# Patient Record
Sex: Female | Born: 1965 | Race: Black or African American | Hispanic: No | Marital: Single | Smoking: Current some day smoker
Health system: Southern US, Community
[De-identification: ages and names within clinical notes are randomized; demographics above are authoritative.]

## PROBLEM LIST (undated history)

## (undated) ENCOUNTER — Emergency Department (HOSPITAL_COMMUNITY): Discharge status: Absent without leave | Payer: Medicare Other

## (undated) DIAGNOSIS — R569 Unspecified convulsions: Secondary | ICD-10-CM

## (undated) DIAGNOSIS — D649 Anemia, unspecified: Secondary | ICD-10-CM

## (undated) DIAGNOSIS — IMO0001 Reserved for inherently not codable concepts without codable children: Secondary | ICD-10-CM

## (undated) DIAGNOSIS — Z8639 Personal history of other endocrine, nutritional and metabolic disease: Secondary | ICD-10-CM

## (undated) DIAGNOSIS — E7800 Pure hypercholesterolemia, unspecified: Secondary | ICD-10-CM

## (undated) DIAGNOSIS — F319 Bipolar disorder, unspecified: Secondary | ICD-10-CM

## (undated) DIAGNOSIS — Z8719 Personal history of other diseases of the digestive system: Secondary | ICD-10-CM

## (undated) DIAGNOSIS — E119 Type 2 diabetes mellitus without complications: Secondary | ICD-10-CM

## (undated) DIAGNOSIS — F259 Schizoaffective disorder, unspecified: Secondary | ICD-10-CM

## (undated) DIAGNOSIS — I10 Essential (primary) hypertension: Secondary | ICD-10-CM

## (undated) DIAGNOSIS — N186 End stage renal disease: Secondary | ICD-10-CM

## (undated) DIAGNOSIS — I5042 Chronic combined systolic (congestive) and diastolic (congestive) heart failure: Secondary | ICD-10-CM

## (undated) DIAGNOSIS — R51 Headache: Secondary | ICD-10-CM

## (undated) DIAGNOSIS — B1920 Unspecified viral hepatitis C without hepatic coma: Secondary | ICD-10-CM

## (undated) DIAGNOSIS — G459 Transient cerebral ischemic attack, unspecified: Secondary | ICD-10-CM

## (undated) DIAGNOSIS — F32A Depression, unspecified: Secondary | ICD-10-CM

## (undated) DIAGNOSIS — F329 Major depressive disorder, single episode, unspecified: Secondary | ICD-10-CM

## (undated) DIAGNOSIS — A4902 Methicillin resistant Staphylococcus aureus infection, unspecified site: Secondary | ICD-10-CM

## (undated) DIAGNOSIS — K219 Gastro-esophageal reflux disease without esophagitis: Secondary | ICD-10-CM

## (undated) DIAGNOSIS — G894 Chronic pain syndrome: Secondary | ICD-10-CM

## (undated) DIAGNOSIS — G629 Polyneuropathy, unspecified: Secondary | ICD-10-CM

## (undated) DIAGNOSIS — R519 Headache, unspecified: Secondary | ICD-10-CM

## (undated) DIAGNOSIS — F419 Anxiety disorder, unspecified: Secondary | ICD-10-CM

## (undated) DIAGNOSIS — Z531 Procedure and treatment not carried out because of patient's decision for reasons of belief and group pressure: Secondary | ICD-10-CM

## (undated) DIAGNOSIS — Z992 Dependence on renal dialysis: Secondary | ICD-10-CM

## (undated) HISTORY — PX: FOOT AMPUTATION: SHX951

## (undated) HISTORY — PX: TONSILLECTOMY: SUR1361

## (undated) HISTORY — DX: Chronic combined systolic (congestive) and diastolic (congestive) heart failure: I50.42

## (undated) HISTORY — DX: Personal history of other endocrine, nutritional and metabolic disease: Z86.39

---

## 1998-04-11 ENCOUNTER — Emergency Department (HOSPITAL_COMMUNITY): Admission: EM | Admit: 1998-04-11 | Discharge: 1998-04-11 | Payer: Self-pay | Admitting: Emergency Medicine

## 1998-08-05 HISTORY — PX: TUBAL LIGATION: SHX77

## 2001-06-09 ENCOUNTER — Emergency Department (HOSPITAL_COMMUNITY): Admission: EM | Admit: 2001-06-09 | Discharge: 2001-06-09 | Payer: Self-pay | Admitting: Emergency Medicine

## 2001-06-09 ENCOUNTER — Encounter: Payer: Self-pay | Admitting: Emergency Medicine

## 2002-02-18 ENCOUNTER — Emergency Department (HOSPITAL_COMMUNITY): Admission: EM | Admit: 2002-02-18 | Discharge: 2002-02-18 | Payer: Self-pay | Admitting: Emergency Medicine

## 2003-02-10 ENCOUNTER — Emergency Department (HOSPITAL_COMMUNITY): Admission: EM | Admit: 2003-02-10 | Discharge: 2003-02-10 | Payer: Self-pay | Admitting: Emergency Medicine

## 2003-12-05 ENCOUNTER — Emergency Department (HOSPITAL_COMMUNITY): Admission: EM | Admit: 2003-12-05 | Discharge: 2003-12-05 | Payer: Self-pay | Admitting: Emergency Medicine

## 2004-03-29 ENCOUNTER — Emergency Department (HOSPITAL_COMMUNITY): Admission: EM | Admit: 2004-03-29 | Discharge: 2004-03-29 | Payer: Self-pay | Admitting: Emergency Medicine

## 2004-04-02 ENCOUNTER — Emergency Department (HOSPITAL_COMMUNITY): Admission: EM | Admit: 2004-04-02 | Discharge: 2004-04-02 | Payer: Self-pay | Admitting: Emergency Medicine

## 2006-01-29 ENCOUNTER — Ambulatory Visit: Payer: Self-pay | Admitting: Family Medicine

## 2006-01-29 ENCOUNTER — Ambulatory Visit: Payer: Self-pay | Admitting: *Deleted

## 2006-02-10 ENCOUNTER — Ambulatory Visit: Payer: Self-pay | Admitting: Family Medicine

## 2006-07-25 ENCOUNTER — Emergency Department (HOSPITAL_COMMUNITY): Admission: EM | Admit: 2006-07-25 | Discharge: 2006-07-25 | Payer: Self-pay | Admitting: Emergency Medicine

## 2006-08-26 ENCOUNTER — Ambulatory Visit: Payer: Self-pay | Admitting: Family Medicine

## 2007-01-14 ENCOUNTER — Ambulatory Visit: Payer: Self-pay | Admitting: Family Medicine

## 2007-04-22 ENCOUNTER — Encounter (INDEPENDENT_AMBULATORY_CARE_PROVIDER_SITE_OTHER): Payer: Self-pay | Admitting: *Deleted

## 2007-12-22 ENCOUNTER — Emergency Department (HOSPITAL_COMMUNITY): Admission: EM | Admit: 2007-12-22 | Discharge: 2007-12-22 | Payer: Self-pay | Admitting: Emergency Medicine

## 2008-02-12 ENCOUNTER — Inpatient Hospital Stay (HOSPITAL_COMMUNITY): Admission: EM | Admit: 2008-02-12 | Discharge: 2008-02-14 | Payer: Self-pay | Admitting: Emergency Medicine

## 2008-03-21 ENCOUNTER — Ambulatory Visit (HOSPITAL_COMMUNITY): Admission: RE | Admit: 2008-03-21 | Discharge: 2008-03-21 | Payer: Self-pay | Admitting: Ophthalmology

## 2008-03-28 ENCOUNTER — Ambulatory Visit (HOSPITAL_COMMUNITY): Admission: RE | Admit: 2008-03-28 | Discharge: 2008-03-29 | Payer: Self-pay | Admitting: Ophthalmology

## 2008-08-05 DIAGNOSIS — G459 Transient cerebral ischemic attack, unspecified: Secondary | ICD-10-CM

## 2008-08-05 HISTORY — PX: ABDOMINAL HYSTERECTOMY: SHX81

## 2008-08-05 HISTORY — PX: EYE SURGERY: SHX253

## 2008-08-05 HISTORY — DX: Transient cerebral ischemic attack, unspecified: G45.9

## 2008-10-16 ENCOUNTER — Emergency Department (HOSPITAL_COMMUNITY): Admission: EM | Admit: 2008-10-16 | Discharge: 2008-10-16 | Payer: Self-pay | Admitting: Emergency Medicine

## 2008-11-10 ENCOUNTER — Encounter: Admission: RE | Admit: 2008-11-10 | Discharge: 2008-11-10 | Payer: Self-pay | Admitting: Internal Medicine

## 2009-03-04 ENCOUNTER — Inpatient Hospital Stay (HOSPITAL_COMMUNITY): Admission: AD | Admit: 2009-03-04 | Discharge: 2009-03-14 | Payer: Self-pay | Admitting: Psychiatry

## 2009-03-04 ENCOUNTER — Other Ambulatory Visit: Payer: Self-pay

## 2009-03-04 ENCOUNTER — Emergency Department (HOSPITAL_COMMUNITY): Admission: EM | Admit: 2009-03-04 | Discharge: 2009-03-04 | Payer: Self-pay | Admitting: Emergency Medicine

## 2009-03-04 ENCOUNTER — Other Ambulatory Visit: Payer: Self-pay | Admitting: Emergency Medicine

## 2009-03-04 ENCOUNTER — Ambulatory Visit: Payer: Self-pay | Admitting: Psychiatry

## 2009-05-23 IMAGING — CT CT HEAD W/O CM
1 series · 16 of 30 positions shown, 20 images · non-contrast
Comparison: 07/25/2006

CLINICAL DATA: Headache, visual loss, twitching left eye, vomiting

CT HEAD WITHOUT CONTRAST
TECHNIQUE: Contiguous axial images were obtained from the base of
the skull through the vertex without contrast.

[Series 2: head routine 4.8 h37s · axial · 0.43mm/px · z∈[-100,+28]mm · 16 of 30 slices shown, 20 images]
[im 2/30  brain]
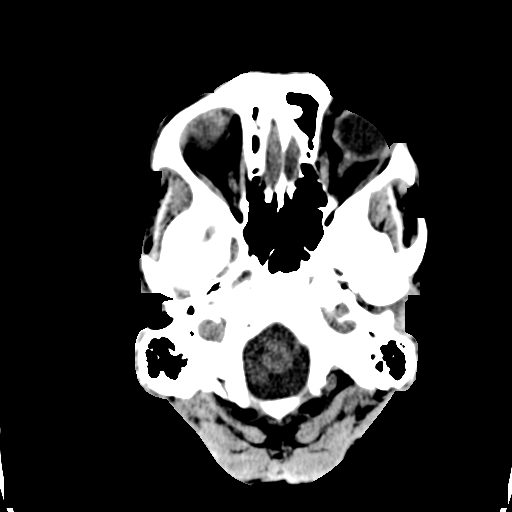
[im 2/30  bone]
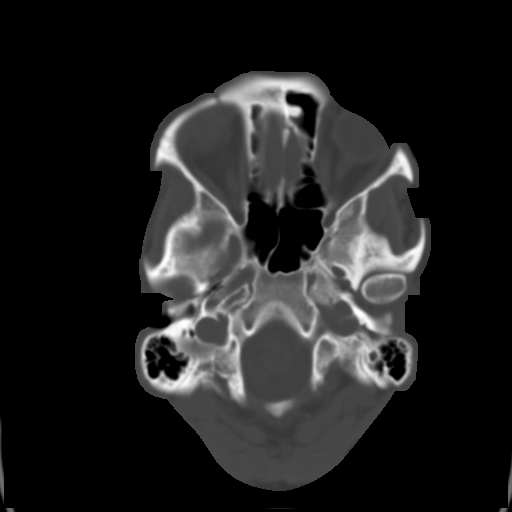
[im 4/30  brain]
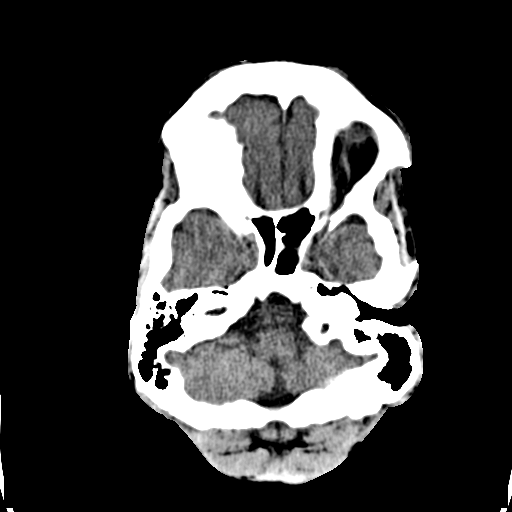
[im 6/30  brain]
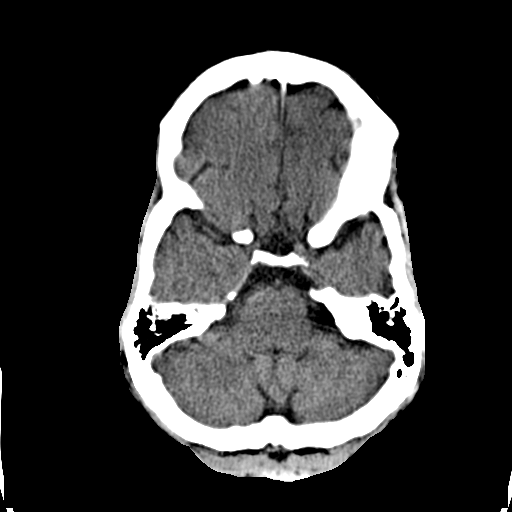
[im 8/30  brain]
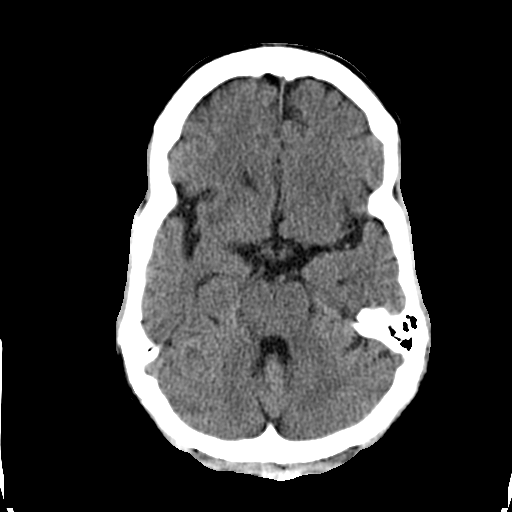
[im 9/30  brain]
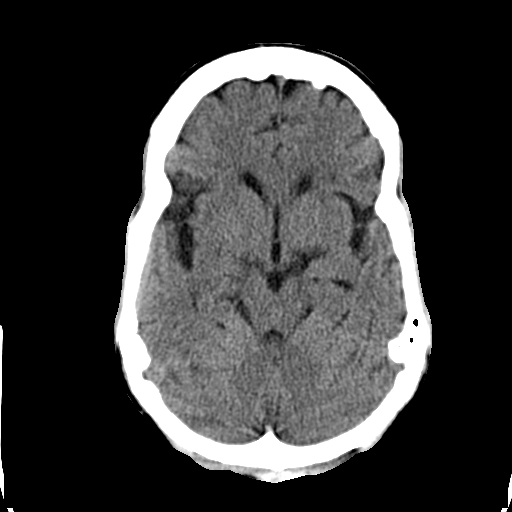
[im 9/30  bone]
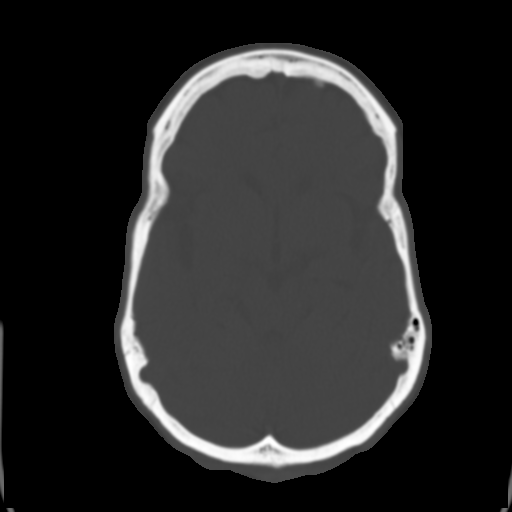
[im 11/30  brain]
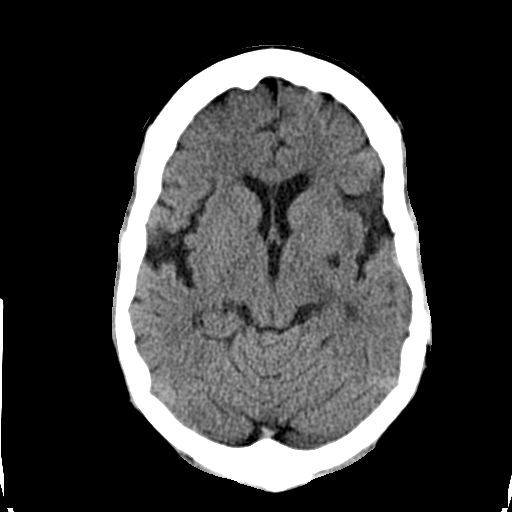
[im 13/30  brain]
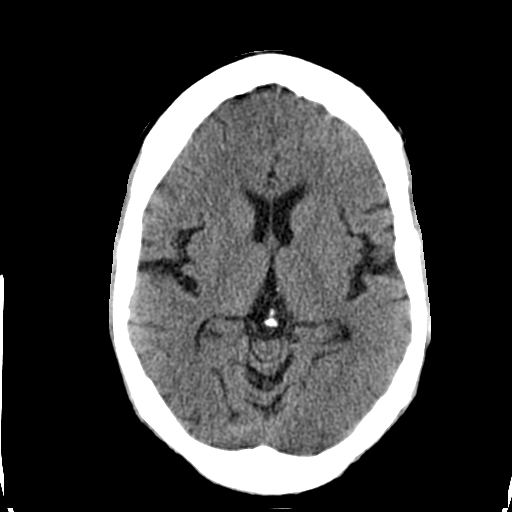
[im 15/30  brain]
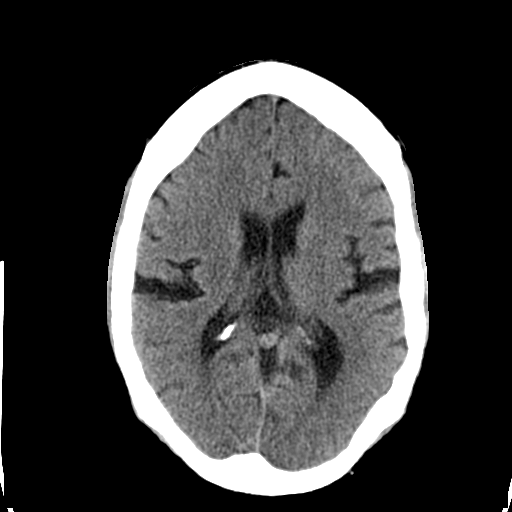
[im 16/30  brain]
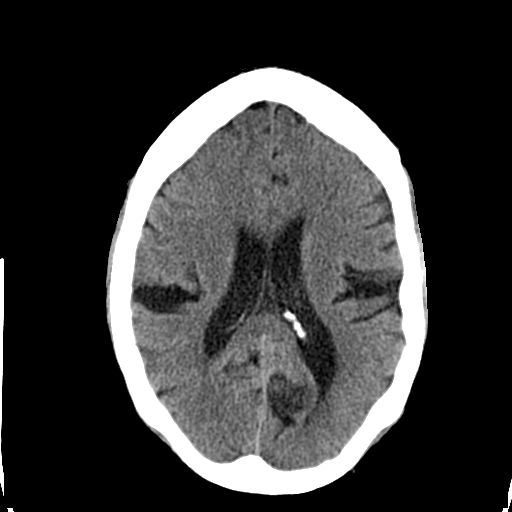
[im 16/30  bone]
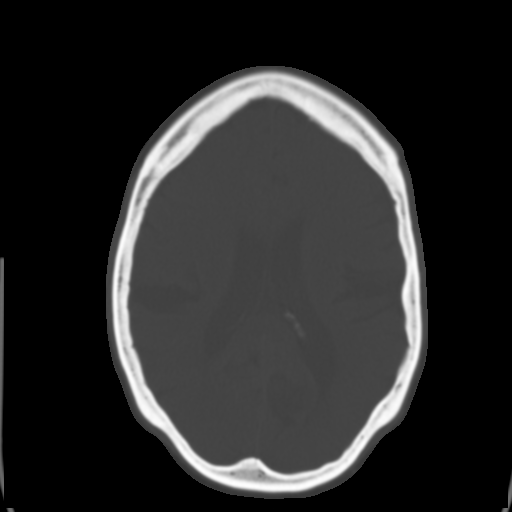
[im 18/30  brain]
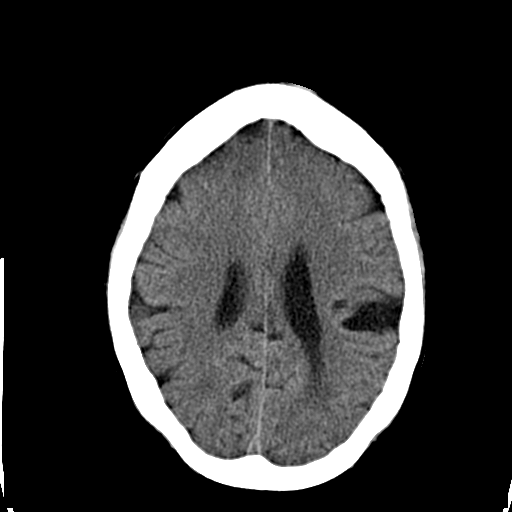
[im 20/30  brain]
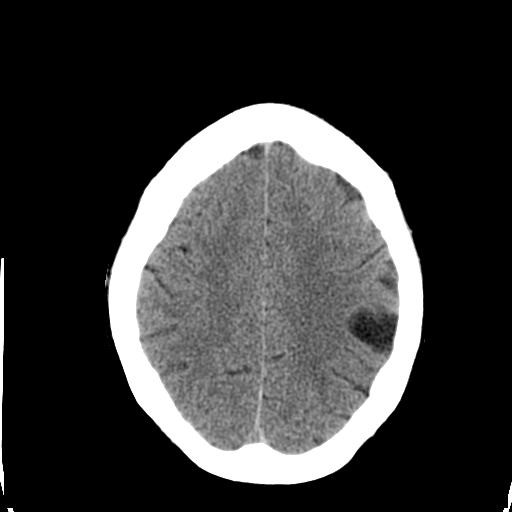
[im 22/30  brain]
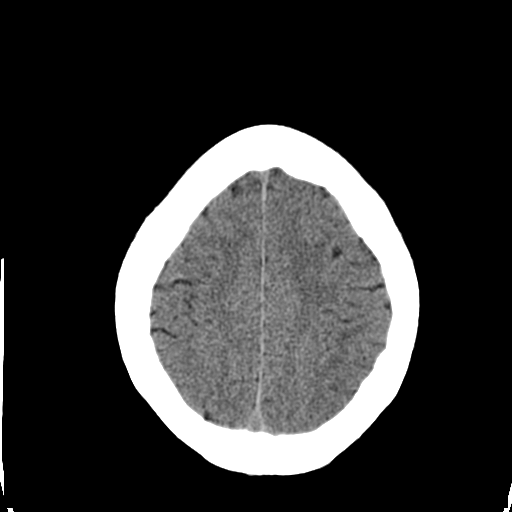
[im 23/30  brain]
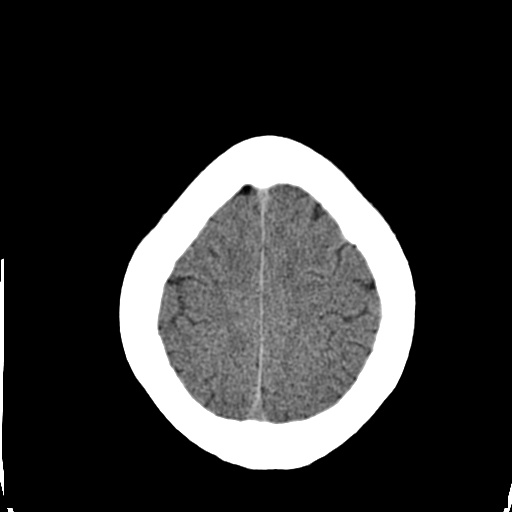
[im 23/30  bone]
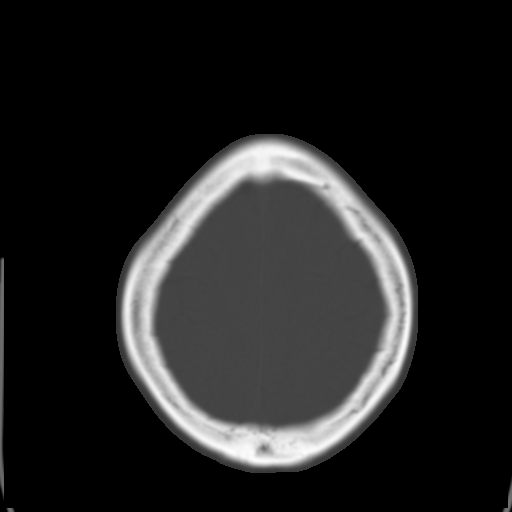
[im 25/30  brain]
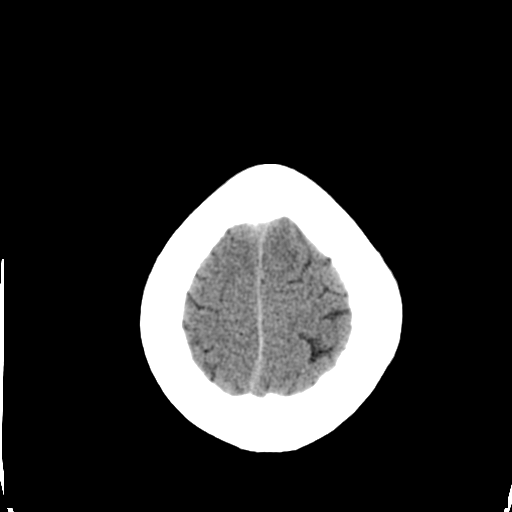
[im 27/30  brain]
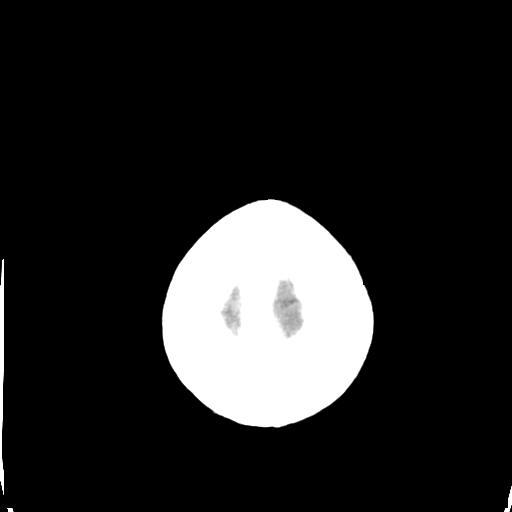
[im 29/30  brain]
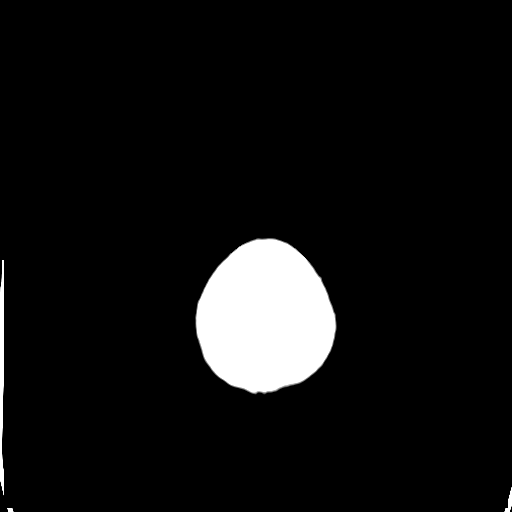

[16 of 30 positions shown; findings below may reference images not displayed]

FINDINGS: Mild generalized atrophy.
Normal ventricular morphology.
No midline shift or mass effect.
Old lacunar infarct left basal ganglia.
No intracranial hemorrhage, mass lesion, or acute infarct.
Visualized paranasal sinuses and mastoid air cells clear.
Skull unremarkable.
IMPRESSION: Old left basal ganglia lacunar infarct.
No acute intracranial abnormalities.

## 2009-05-23 IMAGING — CR DG CHEST 2V
2 series · 2 of 2 positions shown · non-contrast
Comparison: None

CLINICAL DATA: Headache, chest pain, and shortness of breath for 1
month

CHEST - 2 VIEW

[w chest pa]
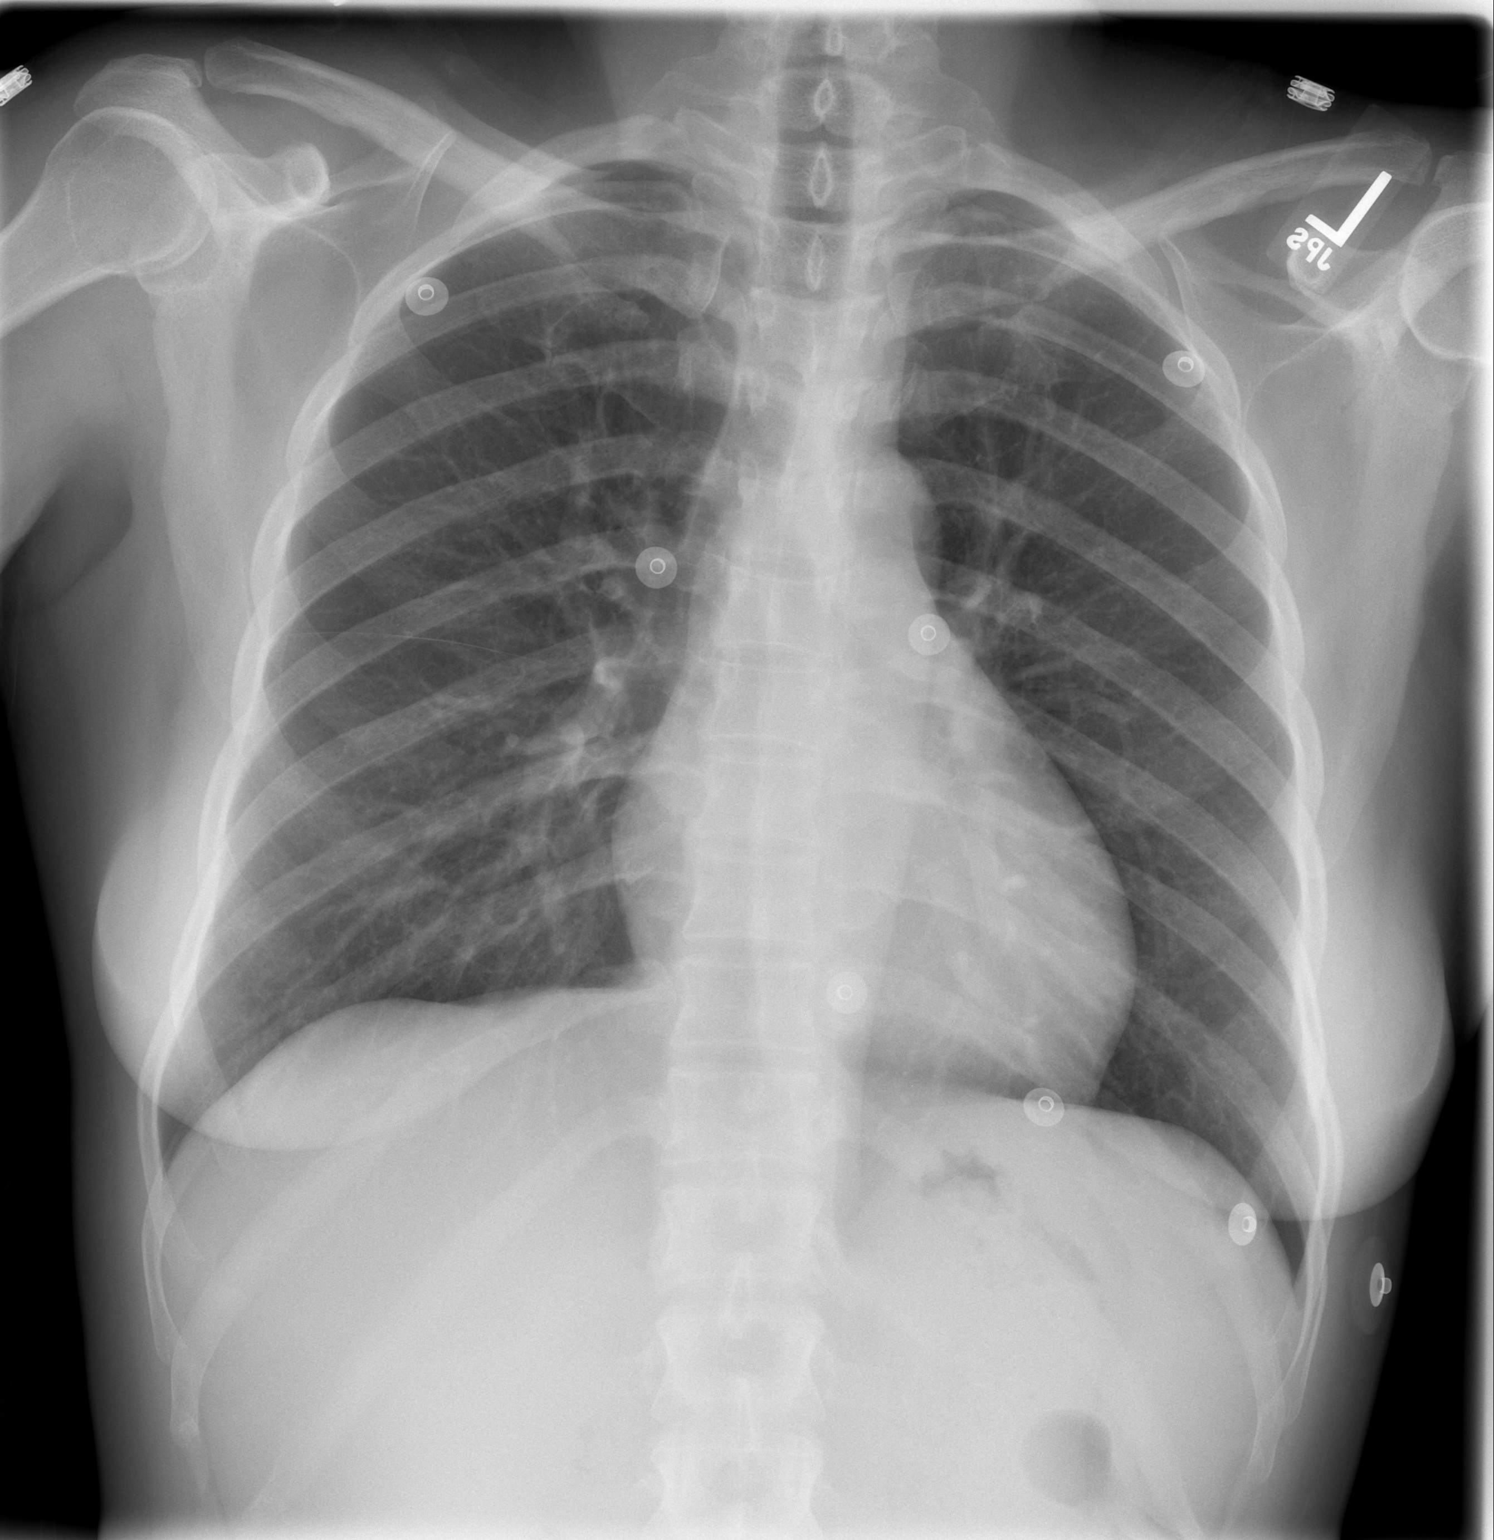

[w chest lat]
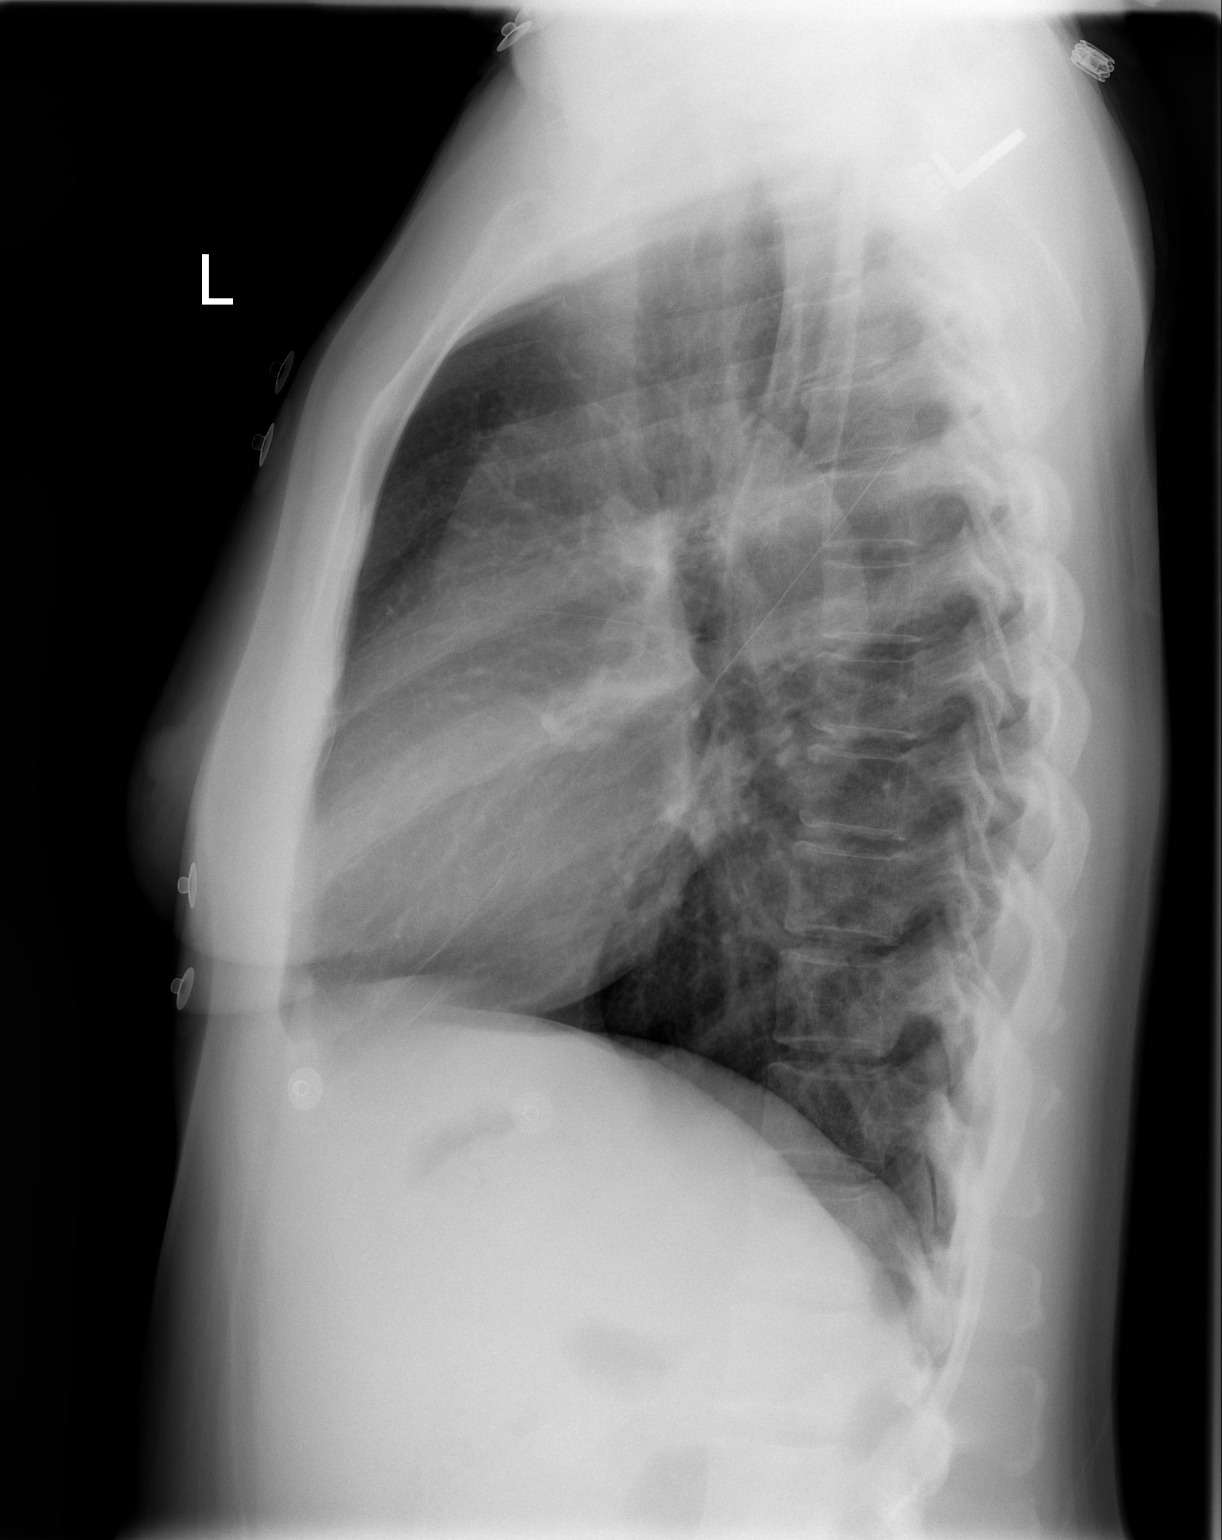

[2 of 2 positions shown; findings below may reference images not displayed]

FINDINGS: The cardiac and mediastinal silhouette appear
unremarkable.  The lung fields are clear.  There is no bony
abnormality.
IMPRESSION: No active disease

## 2010-06-13 IMAGING — CR DG CHEST 1V PORT
1 series · 1 of 1 positions shown · non-contrast
Comparison: 02/12/2008

CLINICAL DATA: Chest pain

PORTABLE CHEST - 1 VIEW

[view not recorded]
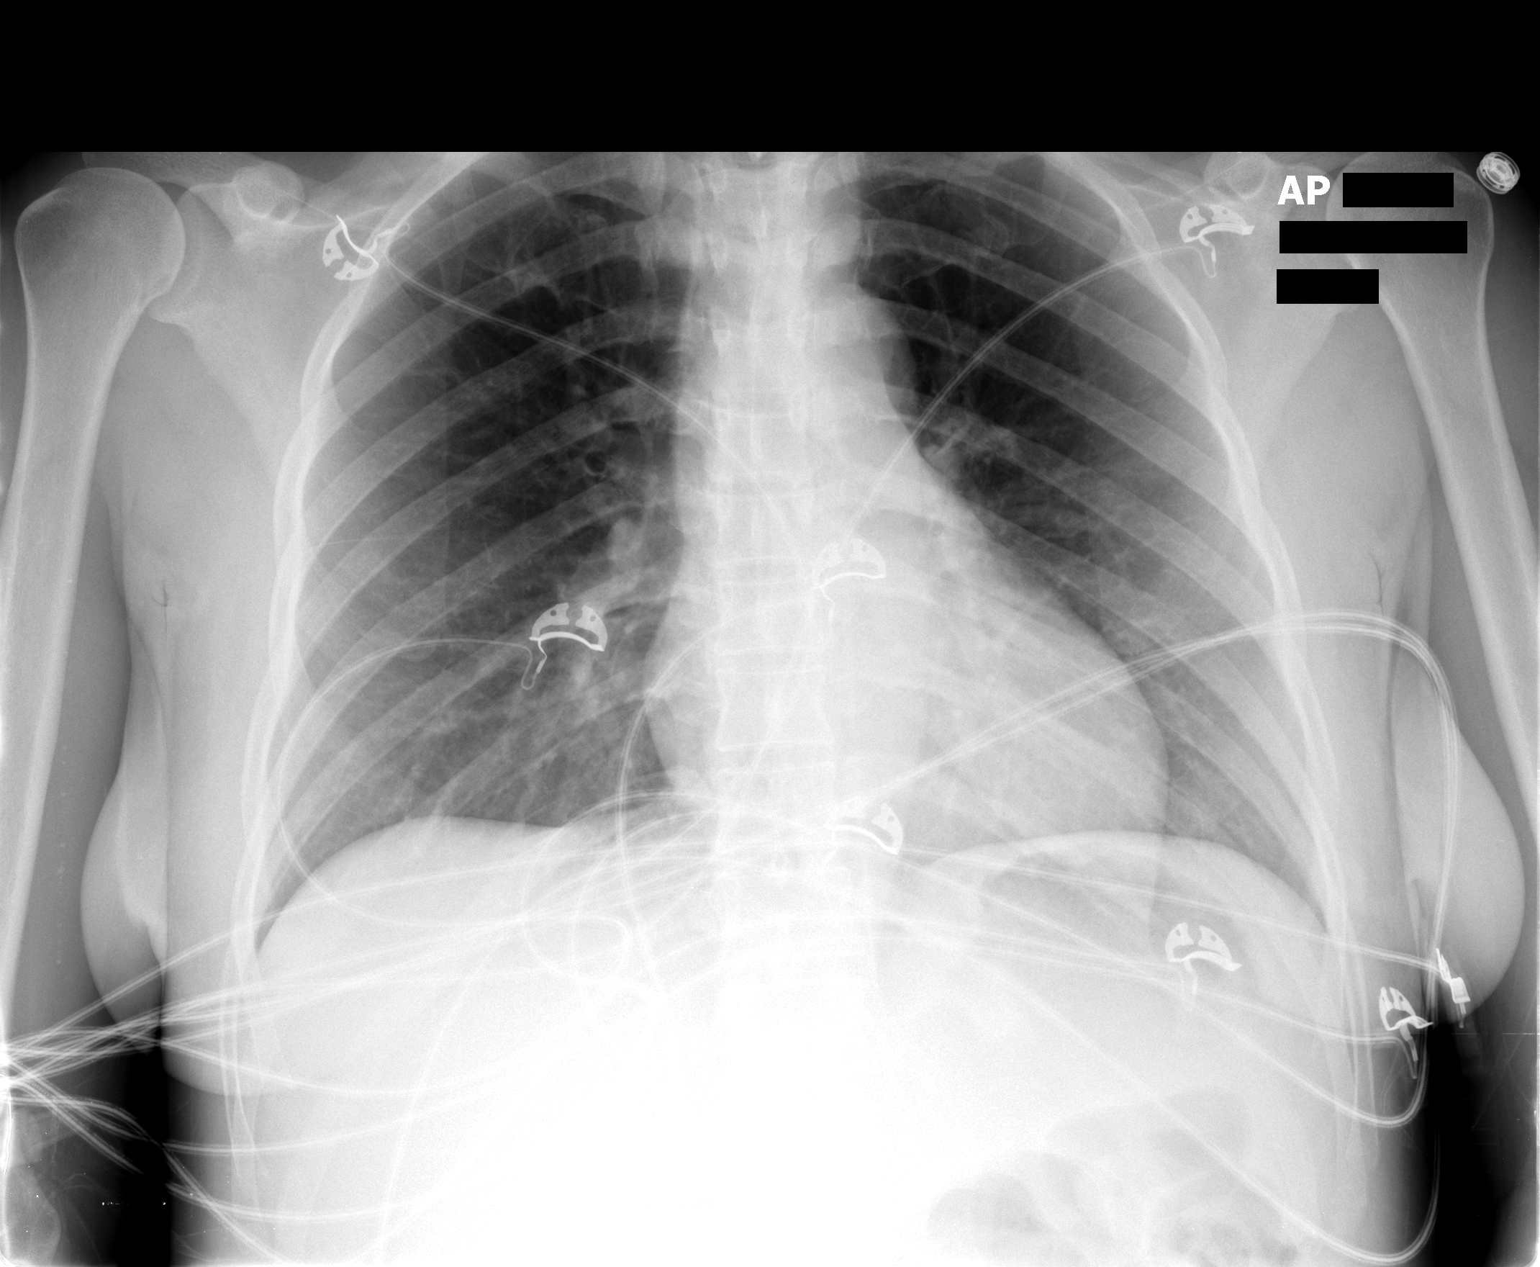

[1 of 1 positions shown; findings below may reference images not displayed]

FINDINGS: Heart and mediastinal contours are within normal limits.
No focal opacities or effusions.  No acute bony abnormality.
IMPRESSION: No active disease.

## 2010-11-10 LAB — GLUCOSE, CAPILLARY
Glucose-Capillary: 101 mg/dL — ABNORMAL HIGH (ref 70–99)
Glucose-Capillary: 121 mg/dL — ABNORMAL HIGH (ref 70–99)
Glucose-Capillary: 146 mg/dL — ABNORMAL HIGH (ref 70–99)
Glucose-Capillary: 15 mg/dL — CL (ref 70–99)
Glucose-Capillary: 155 mg/dL — ABNORMAL HIGH (ref 70–99)
Glucose-Capillary: 185 mg/dL — ABNORMAL HIGH (ref 70–99)
Glucose-Capillary: 187 mg/dL — ABNORMAL HIGH (ref 70–99)
Glucose-Capillary: 196 mg/dL — ABNORMAL HIGH (ref 70–99)
Glucose-Capillary: 213 mg/dL — ABNORMAL HIGH (ref 70–99)
Glucose-Capillary: 225 mg/dL — ABNORMAL HIGH (ref 70–99)
Glucose-Capillary: 229 mg/dL — ABNORMAL HIGH (ref 70–99)
Glucose-Capillary: 230 mg/dL — ABNORMAL HIGH (ref 70–99)
Glucose-Capillary: 244 mg/dL — ABNORMAL HIGH (ref 70–99)
Glucose-Capillary: 254 mg/dL — ABNORMAL HIGH (ref 70–99)
Glucose-Capillary: 256 mg/dL — ABNORMAL HIGH (ref 70–99)
Glucose-Capillary: 269 mg/dL — ABNORMAL HIGH (ref 70–99)
Glucose-Capillary: 276 mg/dL — ABNORMAL HIGH (ref 70–99)
Glucose-Capillary: 287 mg/dL — ABNORMAL HIGH (ref 70–99)
Glucose-Capillary: 291 mg/dL — ABNORMAL HIGH (ref 70–99)
Glucose-Capillary: 309 mg/dL — ABNORMAL HIGH (ref 70–99)
Glucose-Capillary: 313 mg/dL — ABNORMAL HIGH (ref 70–99)
Glucose-Capillary: 329 mg/dL — ABNORMAL HIGH (ref 70–99)
Glucose-Capillary: 375 mg/dL — ABNORMAL HIGH (ref 70–99)
Glucose-Capillary: 376 mg/dL — ABNORMAL HIGH (ref 70–99)
Glucose-Capillary: 426 mg/dL — ABNORMAL HIGH (ref 70–99)
Glucose-Capillary: 75 mg/dL (ref 70–99)

## 2010-11-10 LAB — BASIC METABOLIC PANEL
BUN: 23 mg/dL (ref 6–23)
CO2: 23 mEq/L (ref 19–32)
Calcium: 9.6 mg/dL (ref 8.4–10.5)
GFR calc non Af Amer: 51 mL/min — ABNORMAL LOW (ref >60)
Glucose, Bld: 420 mg/dL — ABNORMAL HIGH (ref 70–99)

## 2010-11-10 LAB — HEMOGLOBIN A1C
Hgb A1c MFr Bld: 8.9 % — ABNORMAL HIGH (ref 4.6–6.1)
Mean Plasma Glucose: 209 mg/dL

## 2010-11-11 LAB — CBC
HCT: 37.7 % (ref 36.0–46.0)
Hemoglobin: 12.9 g/dL (ref 12.0–15.0)
MCHC: 34.4 g/dL (ref 30.0–36.0)
MCV: 94.8 fL (ref 78.0–100.0)
Platelets: 249 10*3/uL (ref 150–400)
RDW: 12.2 % (ref 11.5–15.5)
RDW: 12.3 % (ref 11.5–15.5)
WBC: 5.1 10*3/uL (ref 4.0–10.5)

## 2010-11-11 LAB — GLUCOSE, CAPILLARY: Glucose-Capillary: 453 mg/dL — ABNORMAL HIGH (ref 70–99)

## 2010-11-11 LAB — BASIC METABOLIC PANEL
BUN: 11 mg/dL (ref 6–23)
CO2: 25 mEq/L (ref 19–32)
Calcium: 8.9 mg/dL (ref 8.4–10.5)
Chloride: 102 mEq/L (ref 96–112)
Creatinine, Ser: 1.06 mg/dL (ref 0.4–1.2)
GFR calc Af Amer: >60 mL/min (ref >60)
GFR calc non Af Amer: >60 mL/min (ref >60)
Glucose, Bld: 296 mg/dL — ABNORMAL HIGH (ref 70–99)
Glucose, Bld: 328 mg/dL — ABNORMAL HIGH (ref 70–99)
Potassium: 4 mEq/L (ref 3.5–5.1)
Sodium: 133 mEq/L — ABNORMAL LOW (ref 135–145)

## 2010-11-11 LAB — RAPID URINE DRUG SCREEN, HOSP PERFORMED
Barbiturates: NOT DETECTED
Opiates: NOT DETECTED
Tetrahydrocannabinol: NOT DETECTED

## 2010-11-11 LAB — POCT CARDIAC MARKERS
CKMB, poc: 1 ng/mL (ref 1.0–8.0)
Troponin i, poc: <0.05 ng/mL (ref 0.00–0.09)

## 2010-11-11 LAB — DIFFERENTIAL
Basophils Absolute: 0 10*3/uL (ref 0.0–0.1)
Basophils Relative: 1 % (ref 0–1)
Eosinophils Absolute: 0.1 10*3/uL (ref 0.0–0.7)
Eosinophils Relative: 2 % (ref 0–5)
Lymphocytes Relative: 40 % (ref 12–46)
Lymphs Abs: 2.1 10*3/uL (ref 0.7–4.0)
Monocytes Absolute: 0.3 10*3/uL (ref 0.1–1.0)
Neutro Abs: 2.6 10*3/uL (ref 1.7–7.7)
Neutrophils Relative %: 45 % (ref 43–77)

## 2010-11-15 LAB — CBC
MCHC: 34.1 g/dL (ref 30.0–36.0)
Platelets: 228 10*3/uL (ref 150–400)
RBC: 4.06 MIL/uL (ref 3.87–5.11)
RDW: 14 % (ref 11.5–15.5)

## 2010-11-15 LAB — URINE CULTURE: Colony Count: 60000

## 2010-11-15 LAB — URINALYSIS, ROUTINE W REFLEX MICROSCOPIC
Glucose, UA: 100 mg/dL — AB
Ketones, ur: NEGATIVE mg/dL
Protein, ur: 100 mg/dL — AB

## 2010-11-15 LAB — DIFFERENTIAL
Eosinophils Absolute: 0.1 10*3/uL (ref 0.0–0.7)
Eosinophils Relative: 2 % (ref 0–5)
Lymphs Abs: 1 10*3/uL (ref 0.7–4.0)
Monocytes Absolute: 0.3 10*3/uL (ref 0.1–1.0)
Monocytes Relative: 9 % (ref 3–12)

## 2010-11-15 LAB — COMPREHENSIVE METABOLIC PANEL
ALT: 39 U/L — ABNORMAL HIGH (ref 0–35)
AST: 31 U/L (ref 0–37)
Albumin: 2.9 g/dL — ABNORMAL LOW (ref 3.5–5.2)
CO2: 31 mEq/L (ref 19–32)
Calcium: 8.7 mg/dL (ref 8.4–10.5)
GFR calc Af Amer: 55 mL/min — ABNORMAL LOW (ref >60)
Sodium: 129 mEq/L — ABNORMAL LOW (ref 135–145)
Total Protein: 6.5 g/dL (ref 6.0–8.3)

## 2010-11-15 LAB — URINE MICROSCOPIC-ADD ON

## 2010-12-18 NOTE — H&P (Signed)
NAMESACORA, ARORA NO.:  [id number]   MEDICAL RECORD NO.:  [id number]         PATIENT TYPE:  CINP   LOCATION:                               FACILITY:  [facility]   PHYSICIAN:  Rexene Alberts, M.D.    DATE OF BIRTH:  10-Mar-1966   DATE OF ADMISSION:  02/12/2008  DATE OF DISCHARGE:                              HISTORY & PHYSICAL   PRIMARY CARE PHYSICIAN:  The patient says her primary care physician is  Dr. Jeanie Cooks, although the physician on-call for Dr. Jeanie Cooks refuses to  admit the patient.  The patient's primary psychiatrist is Dr. Mariea Clonts at  [facility].   CHIEF COMPLAINT:  I am going blind in my left eye.  In addition, the  patient complains of chest pain, shortness of breath, dizziness, nausea,  vomiting, and headache.   HISTORY OF PRESENT ILLNESS:  The patient is a 45 year old woman with a  past medical history significant for bipolar disorder, cocaine abuse,  alcohol abuse, and diabetes mellitus.  She presents to the emergency  department with a chief complaint of a significant decrease in vision of  the left eye.  The patient noticed that her vision became cloudy on  Monday, 4 days ago.  Over the next few days, she has had progressive  blurred vision, cloudiness, and at times complete blindness in the left  eye.  She denies any associated eye trauma, eye pain, or eye redness.  She has had an intermittent left posterior headache which has been mild  to moderate in intensity and she describes as throbbing.  She denies any  difficulty eating, swallowing, or speaking.  She denies any recent  confusion.  She has, however, had intermittent dizziness she describes  as being off balance.  She denies vertiginous symptoms.  She denies  associated upper respiratory infection symptoms, fever, and chills.  She  has also had intermittent left-sided chest pain which she has had for  several months now.  The chest pain is sometimes associated with  shortness  of breath.  She has had intermittent swelling in her legs,  particularly when she walks but no leg pain.  She has had some nausea  and intermittent vomiting over the past 2 months.  She denies associated  abdominal pain, diarrhea, bloody stools, painful urination, and bright  red blood per rectum.  She also denies coffee-ground emesis.   The patient admits to relapsing with cocaine.  Over the past 2 days, she  has smoked crack cocaine on several occasions.  She also admits to  drinking two 40-ounce beers daily.   During the evaluation in the emergency department, the patient is noted  to be afebrile and hypertensive with a blood pressure of 180/108.  A CT  scan of the head revealed no acute findings.  However, there is evidence  of an old left lacunar basal ganglia infarct.  Her lab data are  significant for a venous glucose of 432, mild transaminitis with an SGOT  of 63 and SGPT of 41, mild leukopenia with a WBC of 3.5, and an  urine  drug screen positive for cocaine and opiates.  The patient will be  admitted for further evaluation and management.   PAST MEDICAL HISTORY:  1. Type 2 diabetes mellitus with admitted noncompliance over the past      month.  2. Bipolar disorder.  The patient is followed by psychiatrist, Dr.      Mariea Clonts, at the [facility].  3. Polysubstance abuse including alcohol, cocaine, and tobacco.  4. 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  5. Bilateral tubal ligation.   MEDICATIONS):  (The patient admits to being noncompliant at times)  1. Lantus 60 units subcutaneous daily (the patient has not taken      Lantus in 2-3 months).  2. Sliding scale NovoLog q.a.c. and q.h.s.  3. Neurontin 300 mg 3 tablets every night.  4. Combination pill with Actos and metformin question dose twice daily      (the patient is not taking consistently).  5. Lithium 150 mg b.i.d.   ALLERGIES:  THE PATIENT HAS AN ALLERGY TO PENICILLIN, REACTION UNKNOWN.   SOCIAL  HISTORY:  The patient is single.  She lives in [place], [place], with her 3 children.  She receives SSI.  She smokes 3  cigarettes per day.  She denies intravenous drug use.  She smokes crack  cocaine 1 to 2 times daily.  She had been abstinent for 3 years up until  1-2 months ago.  She stopped attending Narcotics Anonymous meetings  recently.  She drinks two 40-ounce beers daily.   FAMILY HISTORY:  Her mother is 37 years of age and has diabetes, end-  stage renal disease, and hypertension.  Her father died of complications  of diabetes mellitus.   REVIEW OF SYSTEMS:  As above in the history present illness.  In  addition, the patient has numbness and burning sensation in her feet.  Otherwise review of systems is negative.   PHYSICAL EXAMINATION:  Temperature 97, blood pressure 180/108, pulse 90,  respiratory rate 20, oxygen saturation 100% on room air.  GENERAL:  The patient is a pleasant, alert, 45 year old, mildly  overweight, African American woman who is currently lying in bed in no  acute distress.  HEENT:  Head is normocephalic nontraumatic.  Pupils equal, round and  reactive to light.  Extraocular muscles intact.  Conjunctivae are clear.  Sclerae are white.  Funduscopic exam on the left reveals a cloudy fundus  with questionable background bleeding.  The fundus on the right is clear  without any acute abnormalities.  Nasal mucosa is dry.  No sinus  tenderness.  Tympanic membranes are mildly obscured with cerumen  bilaterally.  Otherwise no acute changes.  NECK:  Is supple.  No adenopathy, no thyromegaly, no bruit, no JVD.  LUNGS:  Clear to auscultation bilaterally.  HEART:  S1-S2 with no murmurs, rubs or gallops.  ABDOMEN:  Positive bowel sounds, mildly obese, mild epigastric  tenderness without rebound, guarding or distention.  No  hepatosplenomegaly.  GENITOURINARY AND RECTAL:  Deferred.  EXTREMITIES:  Pedal pulses are palpable bilaterally.  No pretibial edema   and no pedal edema.  NEUROLOGIC:  The patient is alert and oriented x3.  Cranial nerves II-  XII are intact with the exception of fine visual acuity on the left  which is significantly diminished.  Grossly, her vision on the left is  intact when the patient is asked to number the fingers that the examiner  is holding up.  Strength is 5/5 throughout.  Sensation  is intact.   ADMISSION LABORATORY:  EKG reveals normal sinus rhythm with no acute  abnormalities and a heart rate of 82 beats per minute.  Chest x-ray  reveals no active cardiopulmonary disease.  CT scan of the head reveals  an old left basal ganglia lacunar infarct but no acute intracranial  abnormalities.  WBC 3.5, hemoglobin 12.8, hematocrit 36.8, platelets  272.  Myoglobin 102, CK-MB 1.5, troponin I less than 0.05.  Sodium 138,  potassium 3.8, chloride 100, CO2 29, glucose 432, BUN 5, creatinine  0.91, calcium 8.7.  SGOT 63, SGPT 41.  Alcohol level less than 5.  Urinalysis:  Urine glucose greater than 1000, urine hemoglobin trace,  urine nitrite negative, urine leukocyte esterase negative.  Urine drug  screen positive for opiates and cocaine.  Lipase 24.   ASSESSMENT:  1. Visual loss in the left eye.  Etiology is unclear.  The patient may      have suffered a retinal hemorrhage.  According to the PA in the      emergency department, ophthalmologist, Dr. Zadie Rhine, was consulted,      and he told the PA that he plans to see the patient tomorrow in the      hospital.  2. Multiple complaints including chest pain, shortness of breath,      disequilibrium, nausea, vomiting, and headache.  3. Uncontrolled diabetes mellitus.  The patient admittedly has been      noncompliant at times with treatment.  Her venous glucose is      currently 432.  4. Polysubstance abuse with alcohol, cocaine, and tobacco.  The      patient's urine drug screen is positive for cocaine and opiates.  5. Elevated blood pressure, probably previously undiagnosed  untreated      hypertension.  6. Mild transaminitis.  More than likely, the patient's transaminitis      is secondary to alcohol abuse.  7. Evidence of old left lacunar basal ganglia infarct per CT scan of      the head.  The patient was informed of this finding.  8. Mild leukopenia.  The patient's WBC is 3.5.  An HIV screen will be      ordered.  9. Bipolar disorder.  The patient is treated chronically with lithium.   PLAN:  1. Ophthalmologist, Dr. Zadie Rhine, has been consulted from the emergency      department.  Will follow up on his consultation tomorrow.  2. Will check a MRI of the brain for further evaluation.  3. Will assess the patient's HIV status.  We will also order an urine      pregnancy test to rule out pregnancy.  4. Will check a 2-D echocardiogram and other baseline labs including      cardiac enzymes, TSH, fasting lipid panel, hemoglobin A1c, and      urinalysis.  We will also check a stat lithium level.  5. Will try to achieve glycemic and blood pressure control.  Will      start Lantus and sliding scale NovoLog.  Consider restarting Actos      and metformin.  6. Will start lisinopril and clonidine.      Rexene Alberts, M.D.  Electronically Signed     DF/MEDQ  D:  02/12/2008  T:  02/12/2008  Job:  [id number]

## 2010-12-18 NOTE — Op Note (Signed)
[name], [name]                  ACCOUNT NO.:  [id number]   MEDICAL RECORD NO.:  [id number]          PATIENT TYPE:  OIB   LOCATION:  5125                         FACILITY:  [facility]   PHYSICIAN:  Clent Demark. Rankin, M.D.   DATE OF BIRTH:  [birth year]   DATE OF PROCEDURE:  03/28/2008  DATE OF DISCHARGE:                               OPERATIVE REPORT   PREOPERATIVE DIAGNOSES:  1. Dense vitreous hemorrhage of the left eye - nonclearing.  2. Proliferative diabetic retinopathy, left eye.   POSTOPERATIVE DIAGNOSES:  1. Dense vitreous hemorrhage of the left eye -  nonclearing.  2. Proliferative diabetic retinopathy, left eye.  3. Epiretinal membrane inferiorly with tractional detachment.   PROCEDURES:  1. Posterior vitrectomy, membrane peel - 25-gauge, left eye inferiorly      for tractional detachment.  2. Endolaser panphotocoagulation, left eye - 25-gauge.   SURGEON:  Clent Demark. Rankin, MD.   ANESTHESIA:  General endotracheal anesthesia as per patient's request.   INDICATION FOR PROCEDURE:  The patient is a 45 year old woman with  profound vision loss of left eye, dense nonclearing vitreous hemorrhage  longstanding in nature.  The patient understands this is an attempt to  remove the dense medial opacity so as to allow best visual function.  She understands the risk of anesthesia including rare occurrence of  death, loss of the eye from the underlying condition as well as surgical  repair including but not limited to hemorrhage, infection, scarring,  need for another surgery, change in vision, loss of vision, and  progressive disease despite intervention.   PROCEDURE IN DETAIL:  Appropriate signed consent was obtained.  The  patient was taken to the operating room.  In the operating room,  appropriate monitors followed by general endotracheal anesthesia.  The  left periocular region was sterilely prepped and draped in the usual  ophthalmic fashion.  Lid speculum was applied.  A 25-gauge  trocar placed  over the inferotemporal quadrant.  Superior trocar was applied.  Core  vitrectomy was then begun.  Dense white old vitreous images were noted.  The posterior hyaloid was identified.  This was elevated and trimmed 360  degrees, vitreous base trimmed 360 degrees.  Two vitreoretinal  detachments and neovascularization noted at the 6 o'clock and second at  the 7 o' clock positions inferiorly anterior to the equator.  These were  probable sites of initial hemorrhage.  There was extensive peripheral  retinal nonperfusion.  Laser photocoagulation was then placed 360  degrees.  Two fluid-air exchanges were carried out to remove the  dispersed ghost-cell type of old hemorrhage.  Fluid  was left in place in the eye.  At this time, the instruments were  removed from the eye and the superior trocar was removed.  The inferior  was removed.  Subconjunctival Decadron applied.  Sterile patch and Fox  shield were applied.  The patient tolerated the procedure well without  complication.  She was taken to the PACU in good stable condition.      Clent Demark Rankin, M.D.  Electronically Signed     GAR/MEDQ  D:  03/28/2008  T:  03/29/2008  Job:  [id number]
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
## 2010-12-18 NOTE — Discharge Summary (Signed)
[name], [name]                  ACCOUNT NO.:  [id number]   MEDICAL RECORD NO.:  [id number]          PATIENT TYPE:  INP   LOCATION:  [id number]                         FACILITY:  [facility]   PHYSICIAN:  Leana Gamer, MDDATE OF BIRTH:  [birth year]   DATE OF ADMISSION:  02/12/2008  DATE OF DISCHARGE:  02/14/2008                               DISCHARGE SUMMARY   DISCHARGE DISPOSITION:  Home.   FINAL DISCHARGE DIAGNOSES:  1. Partial vision loss secondary to diabetic retinopathy.  2. Diabetes type 2, uncontrolled.  3. Chest pain, noncardiac.  4. History of diabetic neuropathy.  5. History of bipolar disorder.  6. History of polysubstance abuse.  7. Hypertension.   DISCHARGE MEDICATIONS:  1. Lantus 60 units subcu nightly.  2. NovoLog sliding scale.  3. Neurontin 300 mg p.o. t.i.d.  4. Lithium 100 mg p.o. b.i.d.  5. Clonidine 0.4 mg p.o. b.i.d.  6. Thiamine 100 mg p.o. daily.  7. Folate 1 mg p.o. daily.   CONSULTANTS:  Dr. Annamaria Boots, Ophthalmology.   PROCEDURE:  None.   DIAGNOSTIC STUDIES:  1. MR of the brain with and without contrast, which showed no acute      abnormality, generalized atrophy for age.  2. 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  No acute intracranial abnormalities.  3. Chest x-ray done on admission, which shows no active disease.   PERTINENT LABORATORY STUDIES:  1. Lithium level less than 0.25  2. Total CK 271, CK-MB 2.5, troponin 0.01x3.  3. Cholesterol 136 total, triglycerides 54, LDL 49, VLDL 11.  4. TSH 1.311.  5. Hemoglobin A1c 8.8.  Sodium 136, potassium 4.2, chloride 106,      bicarb 25, BUN 9, creatinine 0.74.  White blood cell count 3.5,      hemoglobin 12.0, hematocrit 34.9, platelet 237.   CHIEF COMPLAINT:  Vision loss in the left eye over one week and chest  pain.   HISTORY OF PRESENT ILLNESS:  Please see the H&P dictated by Dr. Caryn Section  for details of the HPI.   HOSPITAL COURSE:  1. Chest  pain.  The patient had her enzymes cycled for resting      ischemia.  The patient had no escalation of her enzymes and was      essentially ruled out for resting ischemia.  The patient ambulated      up and down the halls without any further chest pain.  2. Loss of vision in the left eye.  It was discussed with the patient      that diabetes has been uncontrolled.  The patient had not received      a vision examination for 6 years.  I asked Ophthalmology to see the      patient, and he did a slit lamp examination here in the hospital,      which revealed the patient did have diabetic retinopathy with some      mild hemorrhaging. Recommendations are for the patient to follow up      as an outpatient  within one month with Dr. Ara Kussmaul for      further evaluation and treatment of the diabetic retinopathy.  The      patient has also been counseled against further use of cocaine,      alcohol and any drugs, as this can increase the pressures and cause      problems with complications to the diabetic retinopathy.  3. Bipolar disorder.  The patient has been noncompliant to her Lithium      use.  I have recommended the patient go back to her mental health      clinic and be redosed on her Lithium or an alternate medication.  4. Diabetes type 2.  The patient also has been noncompliant with here      treatment for diabetes.  She has been given education on diabetic      management.  The patient is being resumed on her Lantus and NovoLog      as noted above.   CONDITION ON DISCHARGE:  Stable.   The patient is ambulating without any difficulty.  Her vital signs are  stable.  She is afebrile.  LUNGS:  Clear to auscultation.  ABDOMEN:  Obese, soft, nontender, nondistended.  She is normal sinus rhythm on the monitor without any further chest  complaints.   FOLLOWUP:  The patient is to follow up with Dr. Ricki Miller within a  month regarding her diabetic retinopathy.  She is to follow up  with  [facility], where she is a patient, regarding her diabetes.  She has  been counseled against further substance abuse.   DIETARY RESTRICTIONS:  She should be on a diabetic diet.   PHYSICAL RESTRICTIONS:  None.      Leana Gamer, MD  Electronically Signed     MAM/MEDQ  D:  02/14/2008  T:  02/14/2008  Job:  [id number]

## 2011-04-17 ENCOUNTER — Emergency Department (HOSPITAL_COMMUNITY): Payer: Medicare Other

## 2011-04-17 ENCOUNTER — Emergency Department (HOSPITAL_COMMUNITY)
Admission: EM | Admit: 2011-04-17 | Discharge: 2011-04-17 | Disposition: A | Discharge status: Home / Self Care | Payer: Medicare Other | Attending: Emergency Medicine | Admitting: Emergency Medicine

## 2011-04-17 DIAGNOSIS — I10 Essential (primary) hypertension: Secondary | ICD-10-CM | POA: Insufficient documentation

## 2011-04-17 DIAGNOSIS — R0609 Other forms of dyspnea: Secondary | ICD-10-CM | POA: Insufficient documentation

## 2011-04-17 DIAGNOSIS — E119 Type 2 diabetes mellitus without complications: Secondary | ICD-10-CM | POA: Insufficient documentation

## 2011-04-17 DIAGNOSIS — E871 Hypo-osmolality and hyponatremia: Secondary | ICD-10-CM | POA: Insufficient documentation

## 2011-04-17 DIAGNOSIS — Z8673 Personal history of transient ischemic attack (TIA), and cerebral infarction without residual deficits: Secondary | ICD-10-CM | POA: Insufficient documentation

## 2011-04-17 DIAGNOSIS — F319 Bipolar disorder, unspecified: Secondary | ICD-10-CM | POA: Insufficient documentation

## 2011-04-17 DIAGNOSIS — Y92009 Unspecified place in unspecified non-institutional (private) residence as the place of occurrence of the external cause: Secondary | ICD-10-CM | POA: Insufficient documentation

## 2011-04-17 DIAGNOSIS — R059 Cough, unspecified: Secondary | ICD-10-CM | POA: Insufficient documentation

## 2011-04-17 DIAGNOSIS — R0989 Other specified symptoms and signs involving the circulatory and respiratory systems: Secondary | ICD-10-CM | POA: Insufficient documentation

## 2011-04-17 DIAGNOSIS — E2749 Other adrenocortical insufficiency: Secondary | ICD-10-CM | POA: Insufficient documentation

## 2011-04-17 DIAGNOSIS — R05 Cough: Secondary | ICD-10-CM | POA: Insufficient documentation

## 2011-04-17 DIAGNOSIS — X001XXA Exposure to smoke in uncontrolled fire in building or structure, initial encounter: Secondary | ICD-10-CM | POA: Insufficient documentation

## 2011-04-17 DIAGNOSIS — Z79899 Other long term (current) drug therapy: Secondary | ICD-10-CM | POA: Insufficient documentation

## 2011-04-17 DIAGNOSIS — Z794 Long term (current) use of insulin: Secondary | ICD-10-CM | POA: Insufficient documentation

## 2011-04-17 DIAGNOSIS — J705 Respiratory conditions due to smoke inhalation: Secondary | ICD-10-CM | POA: Insufficient documentation

## 2011-04-17 LAB — BASIC METABOLIC PANEL
BUN: 30 mg/dL — ABNORMAL HIGH (ref 6–23)
CO2: 26 mEq/L (ref 19–32)
Calcium: 9.6 mg/dL (ref 8.4–10.5)
Chloride: 94 mEq/L — ABNORMAL LOW (ref 96–112)
Creatinine, Ser: 1.47 mg/dL — ABNORMAL HIGH (ref 0.50–1.10)
GFR calc Af Amer: 47 mL/min — ABNORMAL LOW (ref >60)
GFR calc non Af Amer: 39 mL/min — ABNORMAL LOW (ref >60)
Glucose, Bld: 708 mg/dL (ref 70–99)
Potassium: 5.4 mEq/L — ABNORMAL HIGH (ref 3.5–5.1)
Sodium: 129 mEq/L — ABNORMAL LOW (ref 135–145)

## 2011-04-17 LAB — CBC
HCT: 35.2 % — ABNORMAL LOW (ref 36.0–46.0)
Hemoglobin: 12.3 g/dL (ref 12.0–15.0)
MCH: 31.5 pg (ref 26.0–34.0)
MCHC: 34.9 g/dL (ref 30.0–36.0)
MCV: 90.3 fL (ref 78.0–100.0)
Platelets: 183 10*3/uL (ref 150–400)
RBC: 3.9 MIL/uL (ref 3.87–5.11)
RDW: 12.6 % (ref 11.5–15.5)
WBC: 5.1 10*3/uL (ref 4.0–10.5)

## 2011-04-17 LAB — POCT PREGNANCY, URINE: Preg Test, Ur: NEGATIVE

## 2011-04-17 LAB — CARBOXYHEMOGLOBIN
Carboxyhemoglobin: 0.6 % (ref 0.5–1.5)
Methemoglobin: 0.5 % (ref 0.0–1.5)
O2 Saturation: 30.2 %
Total hemoglobin: 12.2 g/dL — ABNORMAL LOW (ref 12.5–16.0)

## 2011-04-17 LAB — DIFFERENTIAL
Basophils Absolute: 0 10*3/uL (ref 0.0–0.1)
Basophils Relative: 0 % (ref 0–1)
Eosinophils Absolute: 0.1 10*3/uL (ref 0.0–0.7)
Eosinophils Relative: 1 % (ref 0–5)
Lymphocytes Relative: 41 % (ref 12–46)
Lymphs Abs: 2.1 10*3/uL (ref 0.7–4.0)
Monocytes Absolute: 0.3 10*3/uL (ref 0.1–1.0)
Monocytes Relative: 6 % (ref 3–12)
Neutro Abs: 2.6 10*3/uL (ref 1.7–7.7)
Neutrophils Relative %: 51 % (ref 43–77)

## 2011-04-17 LAB — POCT I-STAT TROPONIN I: Troponin i, poc: 0 ng/mL (ref 0.00–0.08)

## 2011-05-01 LAB — DIFFERENTIAL
Basophils Relative: 1
Eosinophils Absolute: 0
Lymphs Abs: 1.8
Monocytes Relative: 8
Neutro Abs: 1.4 — ABNORMAL LOW
Neutrophils Relative %: 40 — ABNORMAL LOW

## 2011-05-01 LAB — URINALYSIS, ROUTINE W REFLEX MICROSCOPIC
Bilirubin Urine: NEGATIVE
Ketones, ur: NEGATIVE
Leukocytes, UA: NEGATIVE
Nitrite: NEGATIVE
Protein, ur: 30 — AB
Urobilinogen, UA: 1
pH: 6

## 2011-05-01 LAB — URINE MICROSCOPIC-ADD ON

## 2011-05-01 LAB — POCT I-STAT, CHEM 8
Calcium, Ion: 1.08 — ABNORMAL LOW
Glucose, Bld: 517
HCT: 44
Hemoglobin: 15
Sodium: 134 — ABNORMAL LOW

## 2011-05-01 LAB — RAPID URINE DRUG SCREEN, HOSP PERFORMED
Amphetamines: NOT DETECTED
Benzodiazepines: NOT DETECTED
Tetrahydrocannabinol: NOT DETECTED

## 2011-05-01 LAB — HEPATIC FUNCTION PANEL
AST: 101 — ABNORMAL HIGH
Albumin: 3.3 — ABNORMAL LOW
Alkaline Phosphatase: 74
Total Bilirubin: 0.8
Total Protein: 7

## 2011-05-01 LAB — LIPASE, BLOOD: Lipase: 37

## 2011-05-01 LAB — CBC
Platelets: 179
RBC: 4.36
WBC: 3.6 — ABNORMAL LOW

## 2011-05-02 LAB — URINALYSIS, ROUTINE W REFLEX MICROSCOPIC
Bilirubin Urine: NEGATIVE
Glucose, UA: >1000 — AB
Protein, ur: 100 — AB
Specific Gravity, Urine: 1.029
Urobilinogen, UA: 1

## 2011-05-02 LAB — CBC
MCHC: 34.3
MCV: 95.3
Platelets: 237
Platelets: 272
RBC: 3.66 — ABNORMAL LOW
RDW: 14.1
RDW: 14.1

## 2011-05-02 LAB — BASIC METABOLIC PANEL
BUN: 5 — ABNORMAL LOW
BUN: 9
Calcium: 8.7
Creatinine, Ser: 0.74
Creatinine, Ser: 0.91
GFR calc Af Amer: >60
GFR calc non Af Amer: >60
GFR calc non Af Amer: >60
Glucose, Bld: 432 — ABNORMAL HIGH
Potassium: 4.2
Sodium: 138

## 2011-05-02 LAB — HEPATIC FUNCTION PANEL
AST: 63 — ABNORMAL HIGH
Alkaline Phosphatase: 59
Bilirubin, Direct: 0.4 — ABNORMAL HIGH
Total Bilirubin: 0.7

## 2011-05-02 LAB — URINALYSIS, MICROSCOPIC ONLY
Bilirubin Urine: NEGATIVE
Glucose, UA: >1000 — AB
Hgb urine dipstick: NEGATIVE
Ketones, ur: NEGATIVE
Leukocytes, UA: NEGATIVE
Protein, ur: 100 — AB
pH: 6.5

## 2011-05-02 LAB — COMPREHENSIVE METABOLIC PANEL
AST: 33
CO2: 25
Calcium: 7.9 — ABNORMAL LOW
Creatinine, Ser: 0.73
GFR calc Af Amer: >60
GFR calc non Af Amer: >60
Sodium: 139
Total Protein: 5 — ABNORMAL LOW

## 2011-05-02 LAB — URINE CULTURE: Colony Count: 10000

## 2011-05-02 LAB — LITHIUM LEVEL: Lithium Lvl: <0.25 — ABNORMAL LOW

## 2011-05-02 LAB — CK TOTAL AND CKMB (NOT AT ARMC)
CK, MB: 2.3
Total CK: 472 — ABNORMAL HIGH

## 2011-05-02 LAB — POCT CARDIAC MARKERS
CKMB, poc: 1.5
Operator id: 294501
Troponin i, poc: <0.05
Troponin i, poc: <0.05

## 2011-05-02 LAB — CARDIAC PANEL(CRET KIN+CKTOT+MB+TROPI)
CK, MB: 1.6
CK, MB: 2.5
Relative Index: 0.9
Total CK: 314 — ABNORMAL HIGH
Troponin I: <0.01

## 2011-05-02 LAB — HEMOGLOBIN A1C
Hgb A1c MFr Bld: 8.8 — ABNORMAL HIGH
Mean Plasma Glucose: 236

## 2011-05-02 LAB — URINE MICROSCOPIC-ADD ON

## 2011-05-02 LAB — LIPID PANEL
Total CHOL/HDL Ratio: 1.8
VLDL: 11

## 2011-05-02 LAB — TSH: TSH: 1.311

## 2011-05-02 LAB — RAPID URINE DRUG SCREEN, HOSP PERFORMED
Amphetamines: NOT DETECTED
Barbiturates: NOT DETECTED
Opiates: POSITIVE — AB

## 2011-05-02 LAB — TROPONIN I: Troponin I: 0.01

## 2011-05-02 LAB — DIFFERENTIAL
Basophils Absolute: 0
Lymphocytes Relative: 49 — ABNORMAL HIGH
Neutro Abs: 1.3 — ABNORMAL LOW

## 2011-05-29 ENCOUNTER — Emergency Department (HOSPITAL_COMMUNITY)
Admission: EM | Admit: 2011-05-29 | Discharge: 2011-05-29 | Disposition: A | Discharge status: Home / Self Care | Payer: Medicare Other | Attending: Emergency Medicine | Admitting: Emergency Medicine

## 2011-05-29 DIAGNOSIS — F319 Bipolar disorder, unspecified: Secondary | ICD-10-CM | POA: Insufficient documentation

## 2011-05-29 DIAGNOSIS — B373 Candidiasis of vulva and vagina: Secondary | ICD-10-CM | POA: Insufficient documentation

## 2011-05-29 DIAGNOSIS — A499 Bacterial infection, unspecified: Secondary | ICD-10-CM | POA: Insufficient documentation

## 2011-05-29 DIAGNOSIS — N9489 Other specified conditions associated with female genital organs and menstrual cycle: Secondary | ICD-10-CM | POA: Insufficient documentation

## 2011-05-29 DIAGNOSIS — R109 Unspecified abdominal pain: Secondary | ICD-10-CM | POA: Insufficient documentation

## 2011-05-29 DIAGNOSIS — B3731 Acute candidiasis of vulva and vagina: Secondary | ICD-10-CM | POA: Insufficient documentation

## 2011-05-29 DIAGNOSIS — N751 Abscess of Bartholin's gland: Secondary | ICD-10-CM | POA: Insufficient documentation

## 2011-05-29 DIAGNOSIS — E119 Type 2 diabetes mellitus without complications: Secondary | ICD-10-CM | POA: Insufficient documentation

## 2011-05-29 DIAGNOSIS — B9689 Other specified bacterial agents as the cause of diseases classified elsewhere: Secondary | ICD-10-CM | POA: Insufficient documentation

## 2011-05-29 DIAGNOSIS — Z79899 Other long term (current) drug therapy: Secondary | ICD-10-CM | POA: Insufficient documentation

## 2011-05-29 DIAGNOSIS — I10 Essential (primary) hypertension: Secondary | ICD-10-CM | POA: Insufficient documentation

## 2011-05-29 DIAGNOSIS — Z794 Long term (current) use of insulin: Secondary | ICD-10-CM | POA: Insufficient documentation

## 2011-05-29 DIAGNOSIS — N760 Acute vaginitis: Secondary | ICD-10-CM | POA: Insufficient documentation

## 2011-05-29 DIAGNOSIS — N949 Unspecified condition associated with female genital organs and menstrual cycle: Secondary | ICD-10-CM | POA: Insufficient documentation

## 2011-05-29 DIAGNOSIS — Z8673 Personal history of transient ischemic attack (TIA), and cerebral infarction without residual deficits: Secondary | ICD-10-CM | POA: Insufficient documentation

## 2011-05-29 LAB — URINALYSIS, ROUTINE W REFLEX MICROSCOPIC
Bilirubin Urine: NEGATIVE
Ketones, ur: NEGATIVE mg/dL
Nitrite: NEGATIVE
Specific Gravity, Urine: 1.02 (ref 1.005–1.030)
Urobilinogen, UA: 1 mg/dL (ref 0.0–1.0)

## 2011-05-29 LAB — WET PREP, GENITAL: Trich, Wet Prep: NONE SEEN

## 2011-05-29 LAB — POCT PREGNANCY, URINE: Preg Test, Ur: NEGATIVE

## 2011-05-29 LAB — URINE MICROSCOPIC-ADD ON

## 2011-05-31 ENCOUNTER — Emergency Department (HOSPITAL_COMMUNITY)
Admission: EM | Admit: 2011-05-31 | Discharge: 2011-05-31 | Disposition: A | Discharge status: Home / Self Care | Payer: Medicare Other | Attending: Emergency Medicine | Admitting: Emergency Medicine

## 2011-05-31 DIAGNOSIS — L03319 Cellulitis of trunk, unspecified: Secondary | ICD-10-CM | POA: Insufficient documentation

## 2011-05-31 DIAGNOSIS — L02219 Cutaneous abscess of trunk, unspecified: Secondary | ICD-10-CM | POA: Insufficient documentation

## 2011-05-31 DIAGNOSIS — Z79899 Other long term (current) drug therapy: Secondary | ICD-10-CM | POA: Insufficient documentation

## 2011-05-31 DIAGNOSIS — E119 Type 2 diabetes mellitus without complications: Secondary | ICD-10-CM | POA: Insufficient documentation

## 2011-05-31 DIAGNOSIS — Z8673 Personal history of transient ischemic attack (TIA), and cerebral infarction without residual deficits: Secondary | ICD-10-CM | POA: Insufficient documentation

## 2011-05-31 DIAGNOSIS — Z09 Encounter for follow-up examination after completed treatment for conditions other than malignant neoplasm: Secondary | ICD-10-CM | POA: Insufficient documentation

## 2011-05-31 DIAGNOSIS — F319 Bipolar disorder, unspecified: Secondary | ICD-10-CM | POA: Insufficient documentation

## 2011-05-31 DIAGNOSIS — I10 Essential (primary) hypertension: Secondary | ICD-10-CM | POA: Insufficient documentation

## 2011-05-31 DIAGNOSIS — Z794 Long term (current) use of insulin: Secondary | ICD-10-CM | POA: Insufficient documentation

## 2011-07-12 ENCOUNTER — Other Ambulatory Visit: Payer: Self-pay

## 2011-07-12 ENCOUNTER — Emergency Department (HOSPITAL_COMMUNITY)
Admission: EM | Admit: 2011-07-12 | Discharge: 2011-07-12 | Disposition: A | Discharge status: Home / Self Care | Payer: Medicare Other | Attending: Emergency Medicine | Admitting: Emergency Medicine

## 2011-07-12 ENCOUNTER — Encounter: Payer: Self-pay | Admitting: *Deleted

## 2011-07-12 DIAGNOSIS — E119 Type 2 diabetes mellitus without complications: Secondary | ICD-10-CM | POA: Insufficient documentation

## 2011-07-12 DIAGNOSIS — Z794 Long term (current) use of insulin: Secondary | ICD-10-CM | POA: Insufficient documentation

## 2011-07-12 DIAGNOSIS — H4089 Other specified glaucoma: Secondary | ICD-10-CM | POA: Insufficient documentation

## 2011-07-12 DIAGNOSIS — IMO0001 Reserved for inherently not codable concepts without codable children: Secondary | ICD-10-CM | POA: Insufficient documentation

## 2011-07-12 DIAGNOSIS — K219 Gastro-esophageal reflux disease without esophagitis: Secondary | ICD-10-CM | POA: Insufficient documentation

## 2011-07-12 DIAGNOSIS — R002 Palpitations: Secondary | ICD-10-CM | POA: Insufficient documentation

## 2011-07-12 DIAGNOSIS — M7918 Myalgia, other site: Secondary | ICD-10-CM

## 2011-07-12 DIAGNOSIS — R079 Chest pain, unspecified: Secondary | ICD-10-CM | POA: Insufficient documentation

## 2011-07-12 DIAGNOSIS — E1165 Type 2 diabetes mellitus with hyperglycemia: Secondary | ICD-10-CM

## 2011-07-12 DIAGNOSIS — B1920 Unspecified viral hepatitis C without hepatic coma: Secondary | ICD-10-CM | POA: Insufficient documentation

## 2011-07-12 DIAGNOSIS — R0602 Shortness of breath: Secondary | ICD-10-CM | POA: Insufficient documentation

## 2011-07-12 DIAGNOSIS — M79609 Pain in unspecified limb: Secondary | ICD-10-CM | POA: Insufficient documentation

## 2011-07-12 DIAGNOSIS — F319 Bipolar disorder, unspecified: Secondary | ICD-10-CM | POA: Insufficient documentation

## 2011-07-12 HISTORY — DX: Polyneuropathy, unspecified: G62.9

## 2011-07-12 LAB — COMPREHENSIVE METABOLIC PANEL
Alkaline Phosphatase: 144 U/L — ABNORMAL HIGH (ref 39–117)
BUN: 21 mg/dL (ref 6–23)
Chloride: 97 mEq/L (ref 96–112)
Creatinine, Ser: 1.08 mg/dL (ref 0.50–1.10)
GFR calc Af Amer: 71 mL/min — ABNORMAL LOW (ref >90)
Glucose, Bld: 470 mg/dL — ABNORMAL HIGH (ref 70–99)
Potassium: 5.1 mEq/L (ref 3.5–5.1)
Total Bilirubin: 0.4 mg/dL (ref 0.3–1.2)

## 2011-07-12 LAB — LACTIC ACID, PLASMA: Lactic Acid, Venous: 1.1 mmol/L (ref 0.5–2.2)

## 2011-07-12 MED ORDER — IBUPROFEN 200 MG PO TABS
ORAL_TABLET | ORAL | Status: AC
  Administered 2011-07-12: 600 mg
  Filled 2011-07-12: qty 3

## 2011-07-12 MED ORDER — INSULIN ASPART 100 UNIT/ML ~~LOC~~ SOLN
10.0000 [IU] | Freq: Once | SUBCUTANEOUS | Status: AC
  Administered 2011-07-12: 10 [IU] via SUBCUTANEOUS
  Filled 2011-07-12: qty 1

## 2011-07-12 MED ORDER — IBUPROFEN 200 MG PO TABS: 600.0000 mg | ORAL_TABLET | Freq: Once | ORAL | Status: DC

## 2011-07-12 MED ORDER — INSULIN GLARGINE 100 UNIT/ML ~~LOC~~ SOLN: 35.0000 [IU] | Freq: Every day | SUBCUTANEOUS | Status: DC

## 2011-07-12 NOTE — ED Notes (Signed)
Patient reports she stopped taking her insulin 1 mth ago to try to loose weight.  She is taking her metformin.  Patient states for the past 3 weeks she has been taking additional metformin if she thought she needed it.  Patient states she has had cramps in her legs and her heart has been racing.  Patient states she looked up side effects of metformin and thinks she may have metabolic problems.  She states she has been trying to be her own doctor.

## 2011-07-12 NOTE — ED Notes (Signed)
Pt reports that right leg pain is worse and different than left leg pain and reports that she has trouble bearing weight.  Pt is afraid she may have a broken bone in her upper right leg or a blood clot.  Pt rates pain in that leg 8/10.

## 2011-07-12 NOTE — ED Provider Notes (Addendum)
History     CSN: [id number] Arrival date & time: 07/12/2011  8:30 AM   None     Chief Complaint  Patient presents with  . Palpitations  . Chest Pain    (Consider location/radiation/quality/duration/timing/severity/associated sxs/prior treatment) HPI   45 y/o woman who moved here from [place] a few months ago with history of poorly controlled DM, Bipolar disorder, past cocaine/alcohol abuse, and HCV who presents with bilateral lower extremity aches and pains for two weeks and a new worse R thigh pain for two days.  She says that her ankle joints, calves, and thighs have been aching for the past two weeks.  Her new R thigh pain is located anteriorly midway up her thigh.  It is a point pain and feel deep "like it's to the bone", 8/10 severity.  She has not noticed any swelling or erythema.  She has had some nausea but no vomiting.  She has also noticed her heart racing at times.  She comes in today mainly because she is concerned for a blood blot in her leg, a broken bone, or "lactic acidosis" which she read about online.  She worries about the latter because she has been self-medicating for her diabetes by taking extra metformin, a total of 5000mg -6000mg  per day.  She stopped her insulin one month ago because she did not want to gain weight.  She saw a new PCP here two weeks ago for her diabetes, but she did not tell the PCP that she had stopped her insulin.  She was started on Amaryl in addition to the metformin and insulin she was already using.  She has not been taking the Amaryl consistently.   She also reports episodes of chest pain for the past two weeks.  She describes the feeling as "there is not enough space for my heart".  When asked to pick a quality for the pain, she says achey, but that it isn't exactly an ache.  Severity 7/10.  For the most part, the pain only comes when she lays down and resolves when she sits up.  No relation to exertion or eating, but she does have SOB when she  has the CP.  At baseline she can walk about two blocks before she gets tired and has to stop.  She has very poor control of her DM.  Her reports that her sugars run in the 400s-600s generally, and her last HbA1c 6 months ago was "very high".  She reports three vitreous hemorrhages from diabetic retinopathy and well as decreased sensation in her feet.    She also has only taken her lisinopril 2-3 times per week at home.  She has a history of bipolar and takes Zoloft daily.  She does reports some episodes of mood swings and anger, as well as some racing thoughts on occasion.  She says her depressed mood has been a problem recently, and she reports mild anhedonia.  She was followed by a psychiatrist in [place] but here she does not have one.  She denies any suicidal ideation.  She says she has medicaid/medicare.  Past Medical History  Diagnosis Date  . Diabetes mellitus   . Hepatitis C virus   . Neuropathy   . Glaucoma   . Bipolar 1 disorder   Past cocaine and alcohol abuse, says she has been clean of both for three years  Past Surgical History  Procedure Date  . Abdominal hysterectomy     Family History: DM in father Mother: DM, ESRD,  HTN   History  Substance Use Topics  . Smoking status: Former Research scientist (life sciences)  . Smokeless tobacco: Not on file  . Alcohol Use: No     Review of Systems See HPI Frequent headaches, thirst, dry mouth.   No fevers.   Allergies  Penicillins  Home Medications   Current Outpatient Rx  Name Route Sig Dispense Refill  . BEE POLLEN 500 MG PO TABS Oral Take 6 tablets by mouth daily.      Marland Kitchen GLIMEPIRIDE 2 MG PO TABS Oral Take 2 mg by mouth daily before breakfast.      . LISINOPRIL 40 MG PO TABS Oral Take 40 mg by mouth daily.      . MELOXICAM 15 MG PO TABS Oral Take 15 mg by mouth daily.      Marland Kitchen METFORMIN HCL 1000 MG PO TABS Oral Take 1,000 mg by mouth 2 (two) times daily with a meal.      . THERA M PLUS PO TABS Oral Take 1 tablet by mouth daily.  Weight management herbal medication     . NEBIVOLOL HCL 10 MG PO TABS Oral Take 10 mg by mouth daily.      Marland Kitchen OMEPRAZOLE 40 MG PO CPDR Oral Take 40 mg by mouth daily.      Marland Kitchen OVER THE COUNTER MEDICATION Oral Take 3 tablets by mouth daily.      Marland Kitchen PROBIOTIC FORMULA PO Oral Take 1 tablet by mouth daily.      . SERTRALINE HCL 100 MG PO TABS Oral Take 100 mg by mouth daily.      Marland Kitchen VITAMIN C 250 MG PO TABS Oral Take 250 mg by mouth daily.      Marland Kitchen VITAMIN E 200 UNITS PO CAPS Oral Take 200 Units by mouth daily.      . INSULIN ASPART 100 UNIT/ML [place]  SOLN Subcutaneous Inject 2-20 Units into the skin 3 (three) times daily before meals. Sliding scale     . INSULIN GLARGINE 100 UNIT/ML [place] SOLN Subcutaneous Inject 70 Units into the skin at bedtime.        BP 139/93  Pulse 81  Temp(Src) 98.1 F (36.7 C) (Oral)  Resp 22  Ht 5\' 4"  (1.626 m)  Wt 210 lb (95.255 kg)  BMI 36.05 kg/m2  SpO2 100%  Physical Exam General: alert, well-developed, and cooperative to examination.  Head: normocephalic and atraumatic.  Eyes: vision grossly intact, pupils equal, pupils round, pupils reactive to light, no injection and anicteric.  Mouth: pharynx pink and moist, no erythema, and no exudates.  Neck: supple, full ROM, no thyromegaly, no JVD, and no carotid bruits.  Lungs: normal respiratory effort, no accessory muscle use, normal breath sounds, no crackles, and no wheezes. Heart: normal rate, regular rhythm, no murmur, no gallop, and no rub.  Abdomen: soft, non-tender, normal bowel sounds, no distention, no guarding, no rebound tenderness, no hepatomegaly, and no splenomegaly.  Msk: no joint swelling, no joint warmth, and no redness over joints.  Pulses: 2+ DP/PT pulses bilaterally Extremities: No cyanosis, clubbing, edema Neurologic: alert & oriented X3, cranial nerves II-XII intact, strength normal in all extremities, sensation intact to light touch, and gait normal.  Skin: turgor normal and no rashes.  Psych:  Oriented X3, memory intact for recent and remote, normally interactive, good eye contact, not anxious appearing, and not depressed appearing.     ED Course  Procedures (including critical care time)   Admission on 07/12/2011  Component Date Value Range Status  .  Glucose-Capillary (mg/dL) 07/12/2011 453* 70-99 Final  . Comment 1  07/12/2011 Documented in Chart   Final  . Comment 2  07/12/2011 Notify RN   Final  . Lactic Acid, Venous (mmol/L) 07/12/2011 1.1  0.5-2.2 Final  . D-Dimer, Quant (ug/mL-FEU) 07/12/2011 0.22  0.00-0.48 Final   Comment:                                 AT THE INHOUSE ESTABLISHED CUTOFF                          VALUE OF 0.48 ug/mL FEU,                          THIS ASSAY HAS BEEN DOCUMENTED                          IN THE LITERATURE TO HAVE                          A SENSITIVITY AND NEGATIVE                          PREDICTIVE VALUE OF AT LEAST                          98 TO 99%.  THE TEST RESULT                          SHOULD BE CORRELATED WITH                          AN ASSESSMENT OF THE CLINICAL                          PROBABILITY OF DVT / VTE.  Marland Kitchen Sodium (mEq/L) 07/12/2011 128* 135-145 Final  . Potassium (mEq/L) 07/12/2011 5.1  3.5-5.1 Final  . Chloride (mEq/L) 07/12/2011 97  96-112 Final  . CO2 (mEq/L) 07/12/2011 21  19-32 Final  . Glucose, Bld (mg/dL) 07/12/2011 470* 70-99 Final  . BUN (mg/dL) 07/12/2011 21  6-23 Final  . Creatinine, Ser (mg/dL) 07/12/2011 1.08  0.50-1.10 Final  . Calcium (mg/dL) 07/12/2011 9.3  8.4-10.5 Final  . Total Protein (g/dL) 07/12/2011 7.7  6.0-8.3 Final  . Albumin (g/dL) 07/12/2011 2.9* 3.5-5.2 Final  . AST (U/L) 07/12/2011 52* 0-37 Final  . ALT (U/L) 07/12/2011 61* 0-35 Final  . Alkaline Phosphatase (U/L) 07/12/2011 144* 39-117 Final  . Total Bilirubin (mg/dL) 07/12/2011 0.4  0.3-1.2 Final  . GFR calc non Af Amer (mL/min) 07/12/2011 61* >90 Final  . GFR calc Af Amer (mL/min) 07/12/2011 71* >90 Final   Comment:                                   The eGFR has been calculated                          using the CKD EPI equation.  This calculation has not been                          validated in all clinical                          situations.                          eGFR's persistently                          <90 mL/min signify                          possible Chronic Kidney Disease.  ] CMET, Lactate, D-Dimer ordered  I reviewed the above labs: AG 10, Lactate normal, D-Dimer negative. Na 128 is likely psuedohyponatremia from for extreme hyperglycemia.  Patient reports that glucose 470 is very common for her.  No anion gap, so not in DKA.  Needs better outpatient glucose management.  Will need soon fu with her PCP.   MDM  I reviewed all aspects of this case with my attending Dr. Vanita Panda.  He agreed with the below assessment and plan:  Plan is to discharge patient to home with following plan:  No evidence of lactic acidosis from metformin.  Liver enzymes mildly elevated in setting of known HCV- these can be followed by her outpt PCP.    Diabetes: Very poor control, high sugars today at baseline.  Gave 10 units novolog but bringing sugar down acutely won't make a long term difference for her.  She needs to restart outpt insulin.  Since she was on very high doses before, will restart her on half her past doses and have her see her PCP next week. -Start back on 35 units lantus per day and Novolog sliding scale TIDAC.   -encouraged her to take her lisinopril regularly for renal protection  Her chest pain sounds suspicious for GI etiology.  EKG also showed no evidence of ischemia and was reassuring.  Encouraged her to start taking her prilosec regularly and follow-up with her primary care doc.    Also, she would benefit from seeing a psychiatrist to follow her bipolar disorder.  Will give [facility] contact [facility] and encourage her to ask her PCP for  recommendation for elsewhere.       Providence Lanius, MD 07/12/11 [place], MD 07/12/11 Angelica, MD 07/12/11 1214

## 2011-08-06 HISTORY — PX: REFRACTIVE SURGERY: SHX103

## 2012-07-26 IMAGING — CR DG CHEST 2V
2 series · 2 of 2 positions shown · non-contrast
Comparison: Portable chest x-ray of 03/04/2009,

CLINICAL DATA: Chest pressure, former smoking history, diabetes

CHEST - 2 VIEW

[w chest pa]
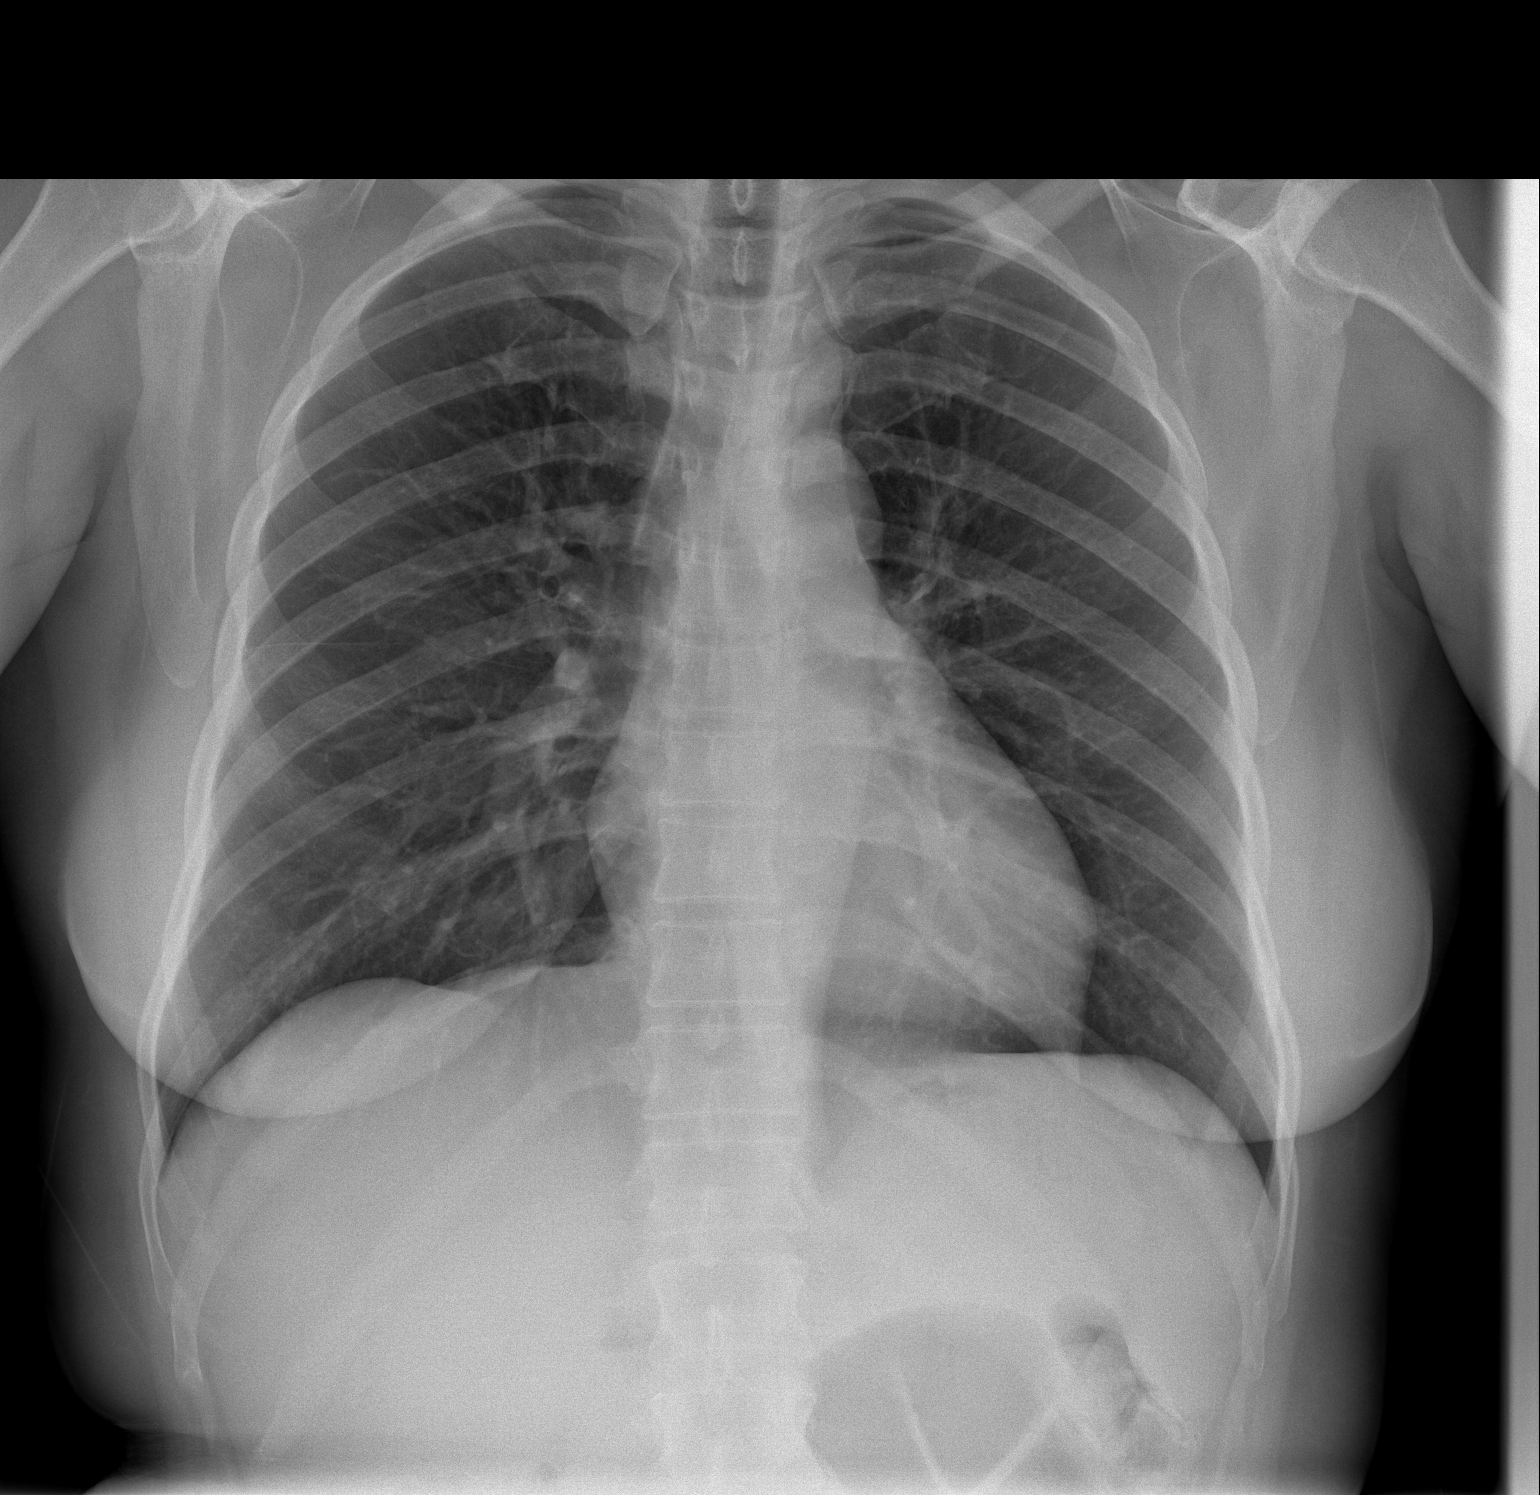

[w chest lat]
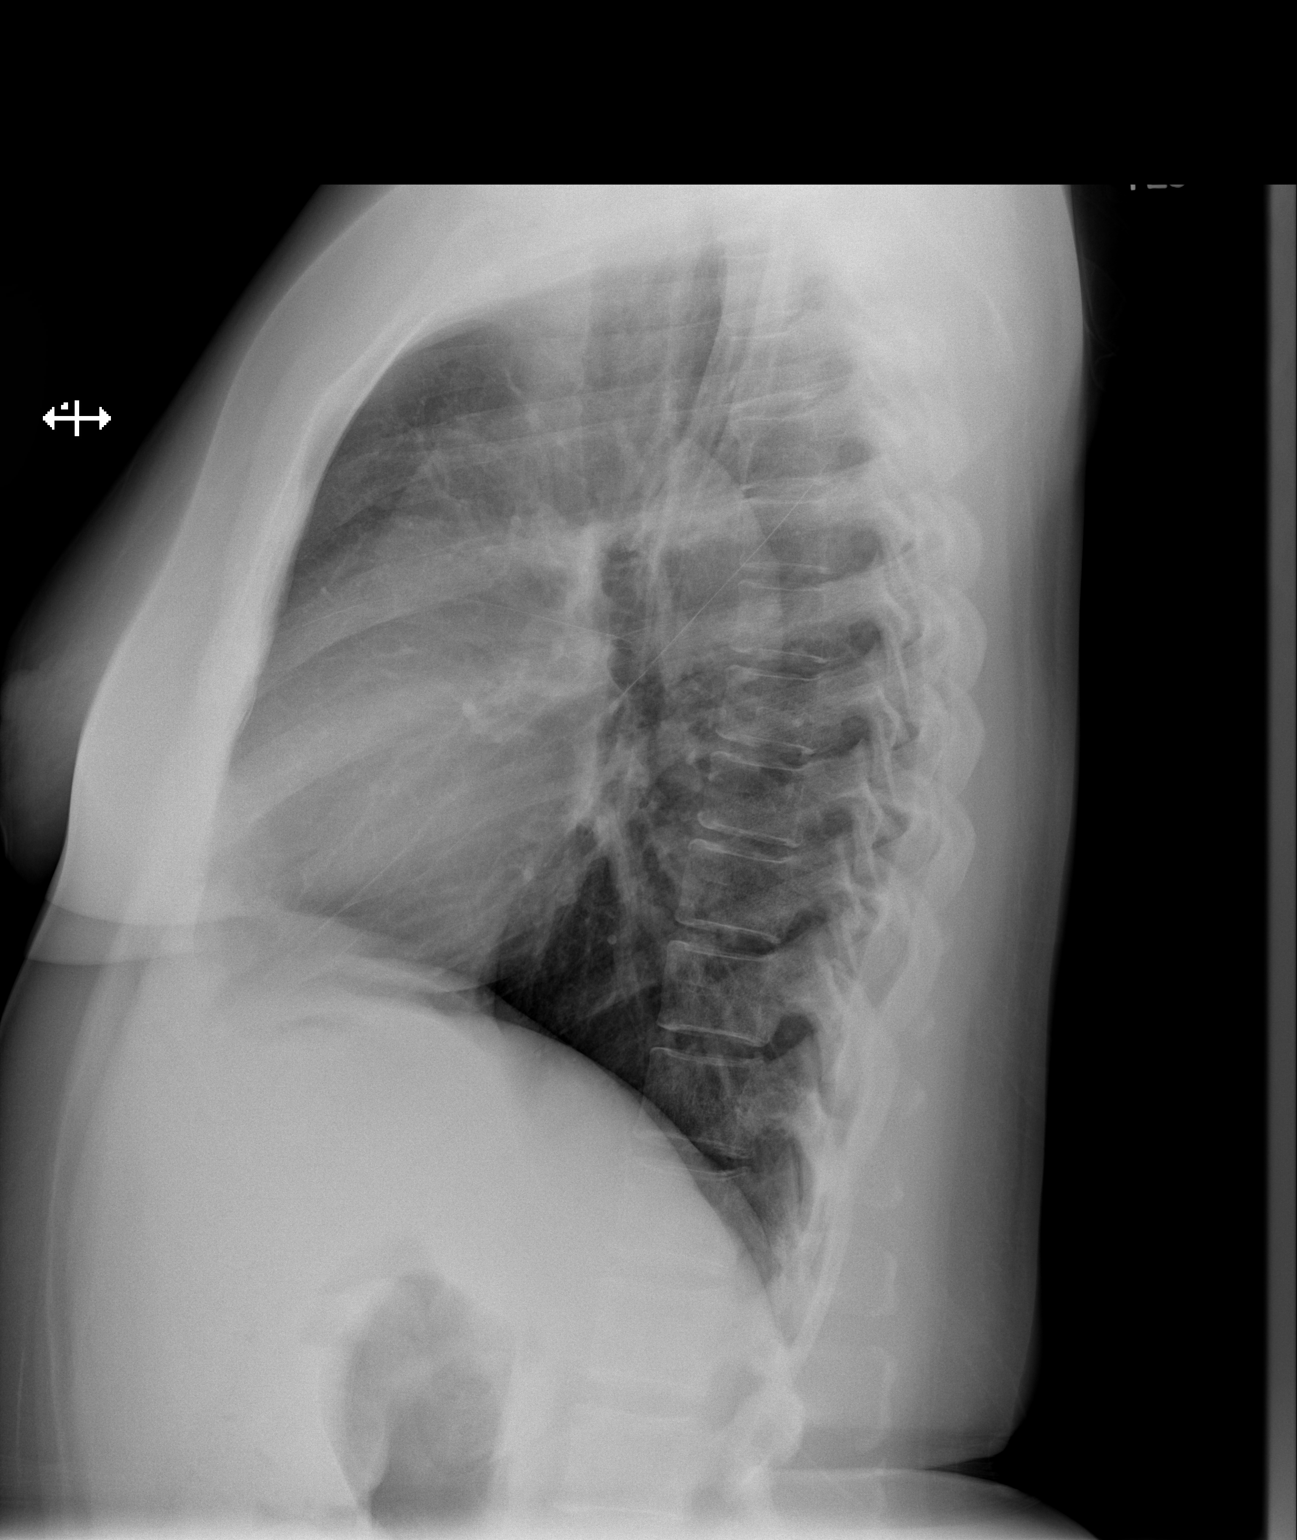

[2 of 2 positions shown; findings below may reference images not displayed]

FINDINGS: The lungs are clear.  Mediastinal contours appear stable.
The heart is within normal limits in size.  No bony abnormality is
seen.
IMPRESSION: No active lung disease.

## 2012-08-05 HISTORY — PX: ABDOMINAL HYSTERECTOMY: SHX81

## 2012-09-28 LAB — TSH: TSH: 2.3 u[IU]/mL (ref 0.41–5.90)

## 2012-12-15 ENCOUNTER — Encounter (HOSPITAL_BASED_OUTPATIENT_CLINIC_OR_DEPARTMENT_OTHER): Payer: Medicare Other

## 2013-02-09 ENCOUNTER — Emergency Department (HOSPITAL_COMMUNITY): Payer: Medicare Other

## 2013-02-09 ENCOUNTER — Encounter (HOSPITAL_COMMUNITY): Payer: Self-pay | Admitting: *Deleted

## 2013-02-09 ENCOUNTER — Inpatient Hospital Stay (HOSPITAL_COMMUNITY)
Admission: EM | Admit: 2013-02-09 | Discharge: 2013-02-11 | DRG: 074 | Disposition: A | Discharge status: Home / Self Care | Payer: Medicare Other | Attending: Internal Medicine | Admitting: Internal Medicine

## 2013-02-09 ENCOUNTER — Observation Stay (HOSPITAL_COMMUNITY)
Admit: 2013-02-09 | Discharge: 2013-02-09 | Disposition: A | Discharge status: Home / Self Care | Payer: Medicare Other | Attending: Internal Medicine | Admitting: Internal Medicine

## 2013-02-09 DIAGNOSIS — Z87891 Personal history of nicotine dependence: Secondary | ICD-10-CM

## 2013-02-09 DIAGNOSIS — B1920 Unspecified viral hepatitis C without hepatic coma: Secondary | ICD-10-CM | POA: Diagnosis present

## 2013-02-09 DIAGNOSIS — H409 Unspecified glaucoma: Secondary | ICD-10-CM | POA: Diagnosis present

## 2013-02-09 DIAGNOSIS — Z79899 Other long term (current) drug therapy: Secondary | ICD-10-CM

## 2013-02-09 DIAGNOSIS — E119 Type 2 diabetes mellitus without complications: Secondary | ICD-10-CM

## 2013-02-09 DIAGNOSIS — A599 Trichomoniasis, unspecified: Secondary | ICD-10-CM

## 2013-02-09 DIAGNOSIS — R55 Syncope and collapse: Secondary | ICD-10-CM

## 2013-02-09 DIAGNOSIS — F411 Generalized anxiety disorder: Secondary | ICD-10-CM | POA: Diagnosis present

## 2013-02-09 DIAGNOSIS — D638 Anemia in other chronic diseases classified elsewhere: Secondary | ICD-10-CM | POA: Diagnosis present

## 2013-02-09 DIAGNOSIS — N390 Urinary tract infection, site not specified: Secondary | ICD-10-CM

## 2013-02-09 DIAGNOSIS — G894 Chronic pain syndrome: Secondary | ICD-10-CM | POA: Diagnosis present

## 2013-02-09 DIAGNOSIS — IMO0002 Reserved for concepts with insufficient information to code with codable children: Secondary | ICD-10-CM | POA: Diagnosis present

## 2013-02-09 DIAGNOSIS — R9431 Abnormal electrocardiogram [ECG] [EKG]: Secondary | ICD-10-CM

## 2013-02-09 DIAGNOSIS — E860 Dehydration: Secondary | ICD-10-CM | POA: Diagnosis present

## 2013-02-09 DIAGNOSIS — F1411 Cocaine abuse, in remission: Secondary | ICD-10-CM | POA: Diagnosis present

## 2013-02-09 DIAGNOSIS — Z8614 Personal history of Methicillin resistant Staphylococcus aureus infection: Secondary | ICD-10-CM | POA: Diagnosis present

## 2013-02-09 DIAGNOSIS — E1149 Type 2 diabetes mellitus with other diabetic neurological complication: Principal | ICD-10-CM | POA: Diagnosis present

## 2013-02-09 DIAGNOSIS — F319 Bipolar disorder, unspecified: Secondary | ICD-10-CM | POA: Diagnosis present

## 2013-02-09 DIAGNOSIS — E1142 Type 2 diabetes mellitus with diabetic polyneuropathy: Secondary | ICD-10-CM | POA: Diagnosis present

## 2013-02-09 DIAGNOSIS — I517 Cardiomegaly: Secondary | ICD-10-CM

## 2013-02-09 DIAGNOSIS — R7401 Elevation of levels of liver transaminase levels: Secondary | ICD-10-CM | POA: Diagnosis present

## 2013-02-09 DIAGNOSIS — G40909 Epilepsy, unspecified, not intractable, without status epilepticus: Secondary | ICD-10-CM

## 2013-02-09 DIAGNOSIS — I951 Orthostatic hypotension: Secondary | ICD-10-CM | POA: Diagnosis present

## 2013-02-09 DIAGNOSIS — R519 Headache, unspecified: Secondary | ICD-10-CM

## 2013-02-09 DIAGNOSIS — I10 Essential (primary) hypertension: Secondary | ICD-10-CM

## 2013-02-09 DIAGNOSIS — Z794 Long term (current) use of insulin: Secondary | ICD-10-CM

## 2013-02-09 DIAGNOSIS — R7402 Elevation of levels of lactic acid dehydrogenase (LDH): Secondary | ICD-10-CM | POA: Diagnosis present

## 2013-02-09 LAB — BASIC METABOLIC PANEL
CO2: 25 mEq/L (ref 19–32)
Calcium: 8.5 mg/dL (ref 8.4–10.5)
Chloride: 101 mEq/L (ref 96–112)
Potassium: 3.8 mEq/L (ref 3.5–5.1)
Sodium: 133 mEq/L — ABNORMAL LOW (ref 135–145)

## 2013-02-09 LAB — HEMOGLOBIN A1C: Mean Plasma Glucose: 217 mg/dL — ABNORMAL HIGH (ref <117)

## 2013-02-09 LAB — URINALYSIS, ROUTINE W REFLEX MICROSCOPIC
Glucose, UA: 100 mg/dL — AB
Protein, ur: >300 mg/dL — AB
Specific Gravity, Urine: 1.018 (ref 1.005–1.030)
pH: 5.5 (ref 5.0–8.0)

## 2013-02-09 LAB — RAPID URINE DRUG SCREEN, HOSP PERFORMED
Amphetamines: NOT DETECTED
Barbiturates: NOT DETECTED
Cocaine: NOT DETECTED
Tetrahydrocannabinol: NOT DETECTED

## 2013-02-09 LAB — CBC WITH DIFFERENTIAL/PLATELET
Basophils Absolute: 0 10*3/uL (ref 0.0–0.1)
Lymphocytes Relative: 30 % (ref 12–46)
Lymphs Abs: 1.4 10*3/uL (ref 0.7–4.0)
Neutro Abs: 2.8 10*3/uL (ref 1.7–7.7)
Neutrophils Relative %: 61 % (ref 43–77)
Platelets: 222 10*3/uL (ref 150–400)
RBC: 3.13 MIL/uL — ABNORMAL LOW (ref 3.87–5.11)
RDW: 14 % (ref 11.5–15.5)
WBC: 4.5 10*3/uL (ref 4.0–10.5)

## 2013-02-09 LAB — HEPATIC FUNCTION PANEL
ALT: 86 U/L — ABNORMAL HIGH (ref 0–35)
Albumin: 2.4 g/dL — ABNORMAL LOW (ref 3.5–5.2)
Indirect Bilirubin: 0.2 mg/dL — ABNORMAL LOW (ref 0.3–0.9)
Total Protein: 6.7 g/dL (ref 6.0–8.3)

## 2013-02-09 LAB — URINE MICROSCOPIC-ADD ON

## 2013-02-09 LAB — TROPONIN I: Troponin I: <0.3 ng/mL (ref <0.30)

## 2013-02-09 MED ORDER — ONDANSETRON HCL 4 MG PO TABS: 4.0000 mg | ORAL_TABLET | Freq: Four times a day (QID) | ORAL | Status: DC | PRN

## 2013-02-09 MED ORDER — HYDROMORPHONE HCL PF 1 MG/ML IJ SOLN
1.0000 mg | INTRAMUSCULAR | Status: DC | PRN
  Administered 2013-02-09: 1 mg via INTRAVENOUS
  Filled 2013-02-09: qty 1

## 2013-02-09 MED ORDER — PANTOPRAZOLE SODIUM 40 MG PO TBEC
40.0000 mg | DELAYED_RELEASE_TABLET | Freq: Every day | ORAL | Status: DC
  Administered 2013-02-09 – 2013-02-11 (×3): 40 mg via ORAL
  Filled 2013-02-09 (×3): qty 1

## 2013-02-09 MED ORDER — SODIUM CHLORIDE 0.9 % IJ SOLN: 3.0000 mL | Freq: Two times a day (BID) | INTRAMUSCULAR | Status: DC

## 2013-02-09 MED ORDER — ONDANSETRON HCL 4 MG/2ML IJ SOLN
4.0000 mg | Freq: Three times a day (TID) | INTRAMUSCULAR | Status: DC | PRN
  Administered 2013-02-09: 4 mg via INTRAVENOUS
  Filled 2013-02-09: qty 2

## 2013-02-09 MED ORDER — SODIUM CHLORIDE 0.9 % IV SOLN
INTRAVENOUS | Status: AC
  Administered 2013-02-09 (×2): via INTRAVENOUS

## 2013-02-09 MED ORDER — DULOXETINE HCL 60 MG PO CPEP
120.0000 mg | ORAL_CAPSULE | Freq: Every day | ORAL | Status: DC
  Administered 2013-02-10 – 2013-02-11 (×2): 120 mg via ORAL
  Filled 2013-02-09 (×2): qty 2

## 2013-02-09 MED ORDER — ONDANSETRON HCL 4 MG/2ML IJ SOLN: 4.0000 mg | Freq: Four times a day (QID) | INTRAMUSCULAR | Status: DC | PRN

## 2013-02-09 MED ORDER — DEXTROSE 5 % IV SOLN
1.0000 g | INTRAVENOUS | Status: DC
  Administered 2013-02-10 – 2013-02-11 (×2): 1 g via INTRAVENOUS
  Filled 2013-02-09 (×2): qty 10

## 2013-02-09 MED ORDER — METOCLOPRAMIDE HCL 5 MG/ML IJ SOLN
10.0000 mg | Freq: Once | INTRAMUSCULAR | Status: AC
  Administered 2013-02-09: 10 mg via INTRAVENOUS
  Filled 2013-02-09: qty 2

## 2013-02-09 MED ORDER — PNEUMOCOCCAL VAC POLYVALENT 25 MCG/0.5ML IJ INJ
0.5000 mL | INJECTION | Freq: Once | INTRAMUSCULAR | Status: AC
  Administered 2013-02-09: 0.5 mL via INTRAMUSCULAR
  Filled 2013-02-09: qty 0.5

## 2013-02-09 MED ORDER — POLYETHYLENE GLYCOL 3350 17 GM/SCOOP PO POWD
17.0000 g | Freq: Every day | ORAL | Status: DC | PRN
  Filled 2013-02-09: qty 255

## 2013-02-09 MED ORDER — SODIUM CHLORIDE 0.9 % IV BOLUS (SEPSIS)
1000.0000 mL | Freq: Once | INTRAVENOUS | Status: AC
  Administered 2013-02-09: 1000 mL via INTRAVENOUS

## 2013-02-09 MED ORDER — POLYETHYLENE GLYCOL 3350 17 G PO PACK
17.0000 g | PACK | Freq: Every day | ORAL | Status: DC | PRN
  Filled 2013-02-09 (×2): qty 1

## 2013-02-09 MED ORDER — PNEUMOCOCCAL VAC POLYVALENT 25 MCG/0.5ML IJ INJ: 0.5000 mL | INJECTION | INTRAMUSCULAR | Status: DC

## 2013-02-09 MED ORDER — ACETAMINOPHEN 325 MG PO TABS: 650.0000 mg | ORAL_TABLET | Freq: Four times a day (QID) | ORAL | Status: DC | PRN

## 2013-02-09 MED ORDER — INSULIN GLARGINE 100 UNIT/ML ~~LOC~~ SOLN
35.0000 [IU] | Freq: Every day | SUBCUTANEOUS | Status: DC
  Administered 2013-02-09 – 2013-02-10 (×2): 35 [IU] via SUBCUTANEOUS
  Filled 2013-02-09 (×3): qty 0.35

## 2013-02-09 MED ORDER — OXYCODONE HCL 5 MG PO TABS
5.0000 mg | ORAL_TABLET | ORAL | Status: DC | PRN
  Administered 2013-02-10 (×2): 5 mg via ORAL
  Filled 2013-02-09 (×2): qty 1

## 2013-02-09 MED ORDER — INSULIN ASPART 100 UNIT/ML ~~LOC~~ SOLN
0.0000 [IU] | Freq: Three times a day (TID) | SUBCUTANEOUS | Status: DC
  Administered 2013-02-09 – 2013-02-10 (×2): 3 [IU] via SUBCUTANEOUS
  Administered 2013-02-11: 8 [IU] via SUBCUTANEOUS
  Administered 2013-02-11: 11 [IU] via SUBCUTANEOUS

## 2013-02-09 MED ORDER — INSULIN ASPART 100 UNIT/ML ~~LOC~~ SOLN: 0.0000 [IU] | Freq: Every day | SUBCUTANEOUS | Status: DC

## 2013-02-09 MED ORDER — DEXTROSE 5 % IV SOLN
1.0000 g | Freq: Once | INTRAVENOUS | Status: AC
  Administered 2013-02-09: 1 g via INTRAVENOUS
  Filled 2013-02-09: qty 10

## 2013-02-09 MED ORDER — LINAGLIPTIN 5 MG PO TABS
5.0000 mg | ORAL_TABLET | Freq: Every day | ORAL | Status: DC
  Administered 2013-02-09 – 2013-02-11 (×3): 5 mg via ORAL
  Filled 2013-02-09 (×3): qty 1

## 2013-02-09 MED ORDER — CLONAZEPAM 0.5 MG PO TABS: 0.5000 mg | ORAL_TABLET | Freq: Two times a day (BID) | ORAL | Status: DC | PRN

## 2013-02-09 MED ORDER — SILVER SULFADIAZINE 1 % EX CREA
TOPICAL_CREAM | Freq: Every day | CUTANEOUS | Status: DC
  Administered 2013-02-09 – 2013-02-11 (×3): via TOPICAL
  Filled 2013-02-09: qty 85

## 2013-02-09 MED ORDER — ACETAMINOPHEN 650 MG RE SUPP: 650.0000 mg | Freq: Four times a day (QID) | RECTAL | Status: DC | PRN

## 2013-02-09 MED ORDER — PREGABALIN 75 MG PO CAPS
75.0000 mg | ORAL_CAPSULE | Freq: Every day | ORAL | Status: DC
  Administered 2013-02-09 – 2013-02-11 (×3): 75 mg via ORAL
  Filled 2013-02-09 (×3): qty 1

## 2013-02-09 MED ORDER — ENOXAPARIN SODIUM 40 MG/0.4ML ~~LOC~~ SOLN
40.0000 mg | SUBCUTANEOUS | Status: DC
  Administered 2013-02-09 – 2013-02-10 (×2): 40 mg via SUBCUTANEOUS
  Filled 2013-02-09 (×3): qty 0.4

## 2013-02-09 MED ORDER — DIPHENHYDRAMINE HCL 50 MG/ML IJ SOLN
25.0000 mg | Freq: Once | INTRAMUSCULAR | Status: AC
  Administered 2013-02-09: 25 mg via INTRAVENOUS
  Filled 2013-02-09: qty 1

## 2013-02-09 MED ORDER — LEVETIRACETAM 500 MG PO TABS
1000.0000 mg | ORAL_TABLET | Freq: Two times a day (BID) | ORAL | Status: DC
  Administered 2013-02-09 – 2013-02-11 (×4): 1000 mg via ORAL
  Filled 2013-02-09 (×5): qty 2

## 2013-02-09 MED ORDER — VITAMINS A & D EX OINT
TOPICAL_OINTMENT | CUTANEOUS | Status: AC
  Administered 2013-02-09: 22:00:00
  Filled 2013-02-09: qty 5

## 2013-02-09 MED ORDER — METRONIDAZOLE 500 MG PO TABS
2000.0000 mg | ORAL_TABLET | Freq: Once | ORAL | Status: AC
  Administered 2013-02-09: 2000 mg via ORAL
  Filled 2013-02-09: qty 4

## 2013-02-09 NOTE — Progress Notes (Signed)
[facility] ED CM noted CM consult: Patient needs help with obtaining a primary care doctor. Also needs help with Glucometer and blood pressure cuff  CM spoke with pt who states she does have medicare but reports she did not receive a medicaid card.  Pt confirms she does not have a pcp.  Pt agreed to allow ED CM to contact the Adult care center to get her an appointment to see a provider. CM sent email to diane boyd Pending response   CM discussed with pt that [facility] does not provide glucometers and bp pressure cuffs but could provide a information on how she could obtain theses items Pt voiced understanding CM also recommended discounted items at local department stores like [employer], [place], [employer] and/or [facility]

## 2013-02-09 NOTE — Progress Notes (Signed)
*  PRELIMINARY RESULTS* Vascular Ultrasound Carotid Duplex (Doppler) has been completed.  Preliminary findings: Bilateral:  Less than 39% ICA stenosis.  Vertebral artery flow is antegrade.      Landry Mellow, RDMS, RVT  02/09/2013, 3:16 PM

## 2013-02-09 NOTE — ED Provider Notes (Signed)
History    CSN: [id number] Arrival date & time 02/09/13  [id number]  First MD Initiated Contact with Patient 02/09/13 [phone]     Chief Complaint  Patient presents with  . Headache   (Consider location/radiation/quality/duration/timing/severity/associated sxs/prior Treatment) Patient is a 47 y.o. female presenting with headaches.  Headache  [name] is a 47 y.o. female with past medical history significant for insulin-dependent diabetes, hepatitis C, seizure disorder, bipolar 1 disorder complaining of syncopal episode this a.m. Patient was walking to leave her house to go to school, there was no prodrome she denies dizziness, chest pain, palpitation, feeling of warmth, change in vision. When patient woke up she was on the floor on the mat that she sleeps on. This was an unwitnessed event. The patient is complaining of diffuse headache, described as aching, worsening over the course of the last 3 weeks. Patient has a history of headache he states that this is not atypical for her exacerbations. Patient denies fever, chest pain, shortness of breath, change in bowel or bladder habits. She does endorse 2 episodes of nonbloody, nonbilious, no coffee ground emesis yesterday. She is now tolerating by mouth. Patient states that she has been compliant with all of her medications including her antiseizure medications.  Patient reports painful swelling and rash to bilateral lower extremities and buttocks. She said she was treated for MRSA with Bactrim intermittently for 9 months, she hasn't taken this in the last 2 months.  Patient states that her seizure disorder developed after I&D of abscess to the posterior head. She said she is going to sue the hospital that performed the original I and D.   Past Medical History  Diagnosis Date  . Diabetes mellitus   . Hepatitis C virus   . Neuropathy   . Glaucoma   . Bipolar 1 disorder    Past Surgical History  Procedure Laterality Date  . Abdominal  hysterectomy     History reviewed. No pertinent family history. History  Substance Use Topics  . Smoking status: Former Research scientist (life sciences)  . Smokeless tobacco: Not on file  . Alcohol Use: No   OB History   Grav Para Term Preterm Abortions TAB SAB Ect Mult Living                 Review of Systems  Constitutional:       Negative except as described in HPI  HENT:       Negative except as described in HPI  Respiratory:       Negative except as described in HPI  Cardiovascular:       Negative except as described in HPI  Gastrointestinal:       Negative except as described in HPI  Genitourinary:       Negative except as described in HPI  Musculoskeletal:       Negative except as described in HPI  Skin:       Negative except as described in HPI  Neurological: Positive for syncope and headaches.       Negative except as described in HPI  All other systems reviewed and are negative.    Allergies  Penicillins  Home Medications   Current Outpatient Rx  Name  Route  Sig  Dispense  Refill  . Bee Pollen 500 MG TABS   Oral   Take 6 tablets by mouth daily.           Marland Kitchen glimepiride (AMARYL) 2 MG tablet   Oral   Take  2 mg by mouth daily before breakfast.           . insulin aspart (NOVOLOG) 100 UNIT/ML injection   Subcutaneous   Inject 2-20 Units into the skin 3 (three) times daily before meals. Sliding scale          . insulin glargine (LANTUS) 100 UNIT/ML injection   Subcutaneous   Inject 35 Units into the skin at bedtime.   10 mL      . lisinopril (PRINIVIL,ZESTRIL) 40 MG tablet   Oral   Take 40 mg by mouth daily.           . meloxicam (MOBIC) 15 MG tablet   Oral   Take 15 mg by mouth daily.           . metFORMIN (GLUCOPHAGE) 1000 MG tablet   Oral   Take 1,000 mg by mouth 2 (two) times daily with a meal.           . Multiple Vitamins-Minerals (MULTIVITAMINS THER. W/MINERALS) TABS   Oral   Take 1 tablet by mouth daily. Weight management herbal medication           . nebivolol (BYSTOLIC) 10 MG tablet   Oral   Take 10 mg by mouth daily.           Marland Kitchen omeprazole (PRILOSEC) 40 MG capsule   Oral   Take 40 mg by mouth daily.           . Probiotic Product (PROBIOTIC FORMULA PO)   Oral   Take 1 tablet by mouth daily.           . sertraline (ZOLOFT) 100 MG tablet   Oral   Take 100 mg by mouth daily.           . vitamin C (ASCORBIC ACID) 250 MG tablet   Oral   Take 250 mg by mouth daily.           . vitamin E (VITAMIN E) 200 UNIT capsule   Oral   Take 200 Units by mouth daily.            BP 148/87  Pulse 101  Temp(Src) 98.7 F (37.1 C) (Oral)  Resp 18  SpO2 100% Physical Exam  Nursing note and vitals reviewed. Constitutional: She is oriented to person, place, and time. She appears well-developed and well-nourished. No distress.  HENT:  Head: Normocephalic and atraumatic.  Mouth/Throat: Oropharynx is clear and moist.  Eyes: Conjunctivae and EOM are normal. Pupils are equal, round, and reactive to light.  Neck: Normal range of motion.  Cardiovascular: Normal rate, regular rhythm and intact distal pulses.   Pulmonary/Chest: Effort normal and breath sounds normal. No stridor. No respiratory distress. She has no wheezes. She has no rales. She exhibits no tenderness.  Abdominal: Soft. Bowel sounds are normal. She exhibits no distension and no mass. There is no tenderness. There is no rebound and no guarding.  Musculoskeletal: Normal range of motion.  Neurological: She is alert and oriented to person, place, and time.  Skin:  Hyperpigmented scarring with scattered small, less than 1 cm, indurated abscesses to bilateral lower extremities and buttocks.  Psychiatric: She has a normal mood and affect.    ED Course  Procedures (including critical care time) Labs Reviewed  CBC WITH DIFFERENTIAL - Abnormal; Notable for the following:    RBC 3.13 (*)    Hemoglobin 9.3 (*)    HCT 27.5 (*)    All other components within  normal  limits  BASIC METABOLIC PANEL - Abnormal; Notable for the following:    Sodium 133 (*)    Glucose, Bld 240 (*)    Creatinine, Ser 1.13 (*)    GFR calc non Af Amer 57 (*)    GFR calc Af Amer 66 (*)    All other components within normal limits  HEPATIC FUNCTION PANEL - Abnormal; Notable for the following:    Albumin 2.4 (*)    AST 101 (*)    ALT 86 (*)    Indirect Bilirubin 0.2 (*)    All other components within normal limits  GLUCOSE, CAPILLARY - Abnormal; Notable for the following:    Glucose-Capillary 231 (*)    All other components within normal limits  URINALYSIS, ROUTINE W REFLEX MICROSCOPIC   Dg Chest 2 View  02/09/2013   *RADIOLOGY REPORT*  Clinical Data: Mid chest pain.  Shortness of breath this morning. History of high blood pressure.  History of smoking.  CHEST - 2 VIEW  Comparison: 04/17/2011  Findings: Cardiomediastinal silhouette is within normal limits. The lungs are free of focal consolidations and pleural effusions. No pulmonary edema. Visualized osseous structures have a normal appearance.  IMPRESSION: Negative exam.   Original Report Authenticated By: Nolon Nations, M.D.     Date: 02/09/2013  Rate: 99  Rhythm: normal sinus rhythm  QRS Axis: normal  Intervals: QT prolonged  ST/T Wave abnormalities: normal  Conduction Disutrbances:none  Narrative Interpretation:   Old EKG Reviewed: changes noted : new QTC prolongation.   1. Syncope   2. HA (headache)   3. Prolonged Q-T interval on ECG   4. Trichomonal infection   5. UTI (lower urinary tract infection)     MDM   Filed Vitals:   02/09/13 [id number] 02/09/13 0731 02/09/13 0732 02/09/13 0733  BP: 148/87 164/83 142/85 109/68  Pulse: 101 99 102 99  Temp: 98.7 F (37.1 C)     TempSrc: Oral     Resp: 18     SpO2: 100%        [name] is a 47 y.o. female with multiple complaints including syncope and headache. Patient is orthostatic by vital signs and by symptoms. She has a mild anemia 9/27. EKG shows new QT  prolongation at 493. There is a mildly elevated glucose of 231.   Patient will be rehydrated, however she does require admission for high risk syncope based on new QTC prolongation.  Medications  sodium chloride 0.9 % bolus 1,000 mL (1,000 mLs Intravenous New Bag/Given 02/09/13 0741)  metoCLOPramide (REGLAN) injection 10 mg (10 mg Intravenous Given 02/09/13 0741)  diphenhydrAMINE (BENADRYL) injection 25 mg (25 mg Intravenous Given 02/09/13 0742)   Patient will be admitted to Dr. [place] Pink. He recommends obtaining CT and then after CT has resulted admitting to a telemetry bed.    Elmyra Ricks Roschelle Calandra, PA-C 02/09/13 1026

## 2013-02-09 NOTE — Procedures (Signed)
ELECTROENCEPHALOGRAM REPORT   Patient: [name]       Room #: [id number] EEG No. ID: [id number] Age: 47 y.o.        Sex: female Referring Physician: Curly Rim. Report Date:  02/09/2013        Interpreting Physician: Anthony Sar  History: [name] is an 47 y.o. female with a history of hypertension, diabetes mellitus and seizure disorder who presented following an episode of loss of consciousness of unclear etiology.   Indications for study:  Rule out seizure activity.  Technique: This is an 18 channel routine scalp EEG performed at the bedside with bipolar and monopolar montages arranged in accordance to the international 10/20 system of electrode placement.   Description: This EEG recording was performed during wakefulness. Overall amplitude of cerebral activity was low. Predominant background activity consists of 10 Hz symmetrical alpha rhythm cord from posterior head regions. Photic stimulation produced symmetrical occipital driving response. Hyperventilation was not performed. No epileptiform discharges were recorded. There was no focal slowing diffusely more focally.   Interpretation: This EEG recording was essentially normal. Amplitude of cerebral activity was low, particularly for patient of this relatively young age. No epileptiform activity was recorded.   Rush Farmer M.D. [facility] [phone]

## 2013-02-09 NOTE — Progress Notes (Signed)
PT Cancellation Note  Patient Details Name: [name] MRN: [id number] DOB: 1966-05-27   Cancelled Treatment:    Reason Eval/Treat Not Completed: Patient at procedure or test/unavailable (EEG). Will check back tomorrow. Thanks.    Weston Anna, MPT Pager: [phone]

## 2013-02-09 NOTE — ED Notes (Signed)
Attempted to call report to floor, per secretary they were not aware of new admission and RN will call me back soon as they are ready.

## 2013-02-09 NOTE — ED Provider Notes (Signed)
Medical screening examination/treatment/procedure(s) were conducted as a shared visit with non-physician practitioner(s) and myself.  I personally evaluated the patient during the encounter.  c/o headache and syncope.  Normal neuro exam.  Admit  Nat Christen, MD 02/09/13 1240

## 2013-02-09 NOTE — ED Notes (Signed)
EMS called to home.  Found patient sitting awaiting EMS.  She is complaining of headache x3 weeks  With additional complaints of shortness of breath.

## 2013-02-09 NOTE — Progress Notes (Signed)
Pt provided with a list of resources to assist with a new gluco meter Given xubex with application, rx out reach with application and [employer] contact information for medicare patients Pt with two female family members present.  Pt voiced understanding Spoke with unit CM about email to Diane at [facility] with pending response for a pcp appointment

## 2013-02-09 NOTE — Progress Notes (Signed)
Patient declined activation of Mychart-does not have Internet access

## 2013-02-09 NOTE — ED Notes (Signed)
Patient states that she has had a problem with equalibrium for 2 months and passed out this morning.  She states she doesn't know if she hit her head on anything.  She states that she has multiple health issues.  Patient has a history of MRSA

## 2013-02-09 NOTE — Progress Notes (Signed)
EEG Completed; Results Pending  

## 2013-02-09 NOTE — H&P (Signed)
[facility] History and Physical  [name] [id number] DOB: 01-25-1966 DOA: 02/09/2013   PCP: Does not have one yet Specialists: None  Chief Complaint: Passed out  HPI: [name] is a 47 y.o. female with a past medical history of hypertension, diabetes, MRSA infections in the past, seizure disorder who recently moved back to [place] from [place]. She was in her usual state of health till this morning when she woke up and had been feeling lightheaded for past few days. She could get dressed and when she turned to get her keys, she passed out. She doesn't remember how many minutes she was unconscious. She did not have any other prodromal symptoms, apart from the feeling of lightheadedness. Denies any focal weakness. No history of any seizures recently, though she does have seizure disorder. Denies any chest pain. Has had some shortness of breath. Denies any cough, or abdominal pain. She did have a couple episodes of vomiting yesterday. Denies any burning sensation with urination, but has noticed whitish vaginal discharge with a fishy odor. She denies any diarrhea. Has been feeling hot and cold. Has had headache on and off for a week as well. Denies being sexually active. Her last menstrual period was in 2010. She's had hysterectomy and a unilateral oophorectomy. She still has one ovary. She is not entirely sure about all of her medical history. Her other records from [place] were reviewed and it appears, that she has chronic pain syndrome, generalized anxiety disorder. She also has history of cocaine abuse.  Home Medications: This list is incomplete. I have requested pharmacy to reconcile medications once again. She is apparently on Lantus, Keppra, Humalog, blood pressure medicine. Patient is not reliable.  Allergies:  Allergies  Allergen Reactions  . Gabapentin Itching and Swelling  . Penicillins Hives    Past Medical History: Past Medical History  Diagnosis Date  .  Diabetes mellitus   . Hepatitis C virus   . Neuropathy   . Glaucoma   . Bipolar 1 disorder     Past Surgical History  Procedure Laterality Date  . Abdominal hysterectomy      Social History:  reports that she has quit smoking. She has never used smokeless tobacco. She reports that she does not drink alcohol or use illicit drugs.  Living Situation: She is currently living by herself. She works in the school system Activity Level: Usually independent with daily activities   Family History: There is history of, diabetes, and heart disease in the family  Review of Systems - History obtained from the patient General ROS: positive for  - fatigue Psychological ROS: negative Ophthalmic ROS: negative ENT ROS: negative Allergy and Immunology ROS: negative Hematological and Lymphatic ROS: negative Endocrine ROS: negative Respiratory ROS: no cough, shortness of breath, or wheezing Cardiovascular ROS: no chest pain or dyspnea on exertion Gastrointestinal ROS: no abdominal pain, change in bowel habits, or black or bloody stools Genito-Urinary ROS: as in hpi Musculoskeletal ROS: negative Neurological ROS: as in hpi Dermatological ROS: negative  Physical Examination  Filed Vitals:   02/09/13 0731 02/09/13 0732 02/09/13 0733 02/09/13 1056  BP: 164/83 142/85 109/68 164/94  Pulse: 99 102 99 102  Temp:    98.6 F (37 C)  TempSrc:    Oral  Resp:    16  SpO2:    98%    General appearance: alert, cooperative, appears stated age and no distress Head: Normocephalic, without obvious abnormality, atraumatic Eyes: conjunctivae/corneas clear. PERRL, EOM's intact.  Throat:  lips, mucosa, and tongue normal; teeth and gums normal Neck: no adenopathy, no carotid bruit, no JVD, supple, symmetrical, trachea midline and thyroid not enlarged, symmetric, no tenderness/mass/nodules Back: symmetric, no curvature. ROM normal. No CVA tenderness. Resp: clear to auscultation bilaterally Cardio: regular  rate and rhythm, S1, S2 normal, no murmur, click, rub or gallop GI: soft, non-tender; bowel sounds normal; no masses,  no organomegaly Extremities: extremities normal, atraumatic, no cyanosis or edema Pulses: 2+ and symmetric Skin: healed skin wounds see in LE Lymph nodes: Cervical, supraclavicular, and axillary nodes normal. Neurologic: Alert and oriented X 3, normal strength and tone. Normal symmetric reflexes. Normal coordination. Cranial nerves intact.  Laboratory Data: Results for orders placed during the hospital encounter of 02/09/13 (from the past 48 hour(s))  GLUCOSE, CAPILLARY     Status: Abnormal   Collection Time    02/09/13  7:37 AM      Result Value Range   Glucose-Capillary 231 (*) 70 - 99 mg/dL  CBC WITH DIFFERENTIAL     Status: Abnormal   Collection Time    02/09/13  7:40 AM      Result Value Range   WBC 4.5  4.0 - 10.5 K/uL   RBC 3.13 (*) 3.87 - 5.11 MIL/uL   Hemoglobin 9.3 (*) 12.0 - 15.0 g/dL   HCT 27.5 (*) 36.0 - 46.0 %   MCV 87.9  78.0 - 100.0 fL   MCH 29.7  26.0 - 34.0 pg   MCHC 33.8  30.0 - 36.0 g/dL   RDW 14.0  11.5 - 15.5 %   Platelets 222  150 - 400 K/uL   Neutrophils Relative % 61  43 - 77 %   Neutro Abs 2.8  1.7 - 7.7 K/uL   Lymphocytes Relative 30  12 - 46 %   Lymphs Abs 1.4  0.7 - 4.0 K/uL   Monocytes Relative 7  3 - 12 %   Monocytes Absolute 0.3  0.1 - 1.0 K/uL   Eosinophils Relative 1  0 - 5 %   Eosinophils Absolute 0.1  0.0 - 0.7 K/uL   Basophils Relative 0  0 - 1 %   Basophils Absolute 0.0  0.0 - 0.1 K/uL  BASIC METABOLIC PANEL     Status: Abnormal   Collection Time    02/09/13  7:40 AM      Result Value Range   Sodium 133 (*) 135 - 145 mEq/L   Potassium 3.8  3.5 - 5.1 mEq/L   Chloride 101  96 - 112 mEq/L   CO2 25  19 - 32 mEq/L   Glucose, Bld 240 (*) 70 - 99 mg/dL   BUN 14  6 - 23 mg/dL   Creatinine, Ser 1.13 (*) 0.50 - 1.10 mg/dL   Calcium 8.5  8.4 - 10.5 mg/dL   GFR calc non Af Amer 57 (*) >90 mL/min   GFR calc Af Amer 66 (*)  >90 mL/min   Comment:            The eGFR has been calculated     using the CKD EPI equation.     This calculation has not been     validated in all clinical     situations.     eGFR's persistently     <90 mL/min signify     possible Chronic Kidney Disease.  HEPATIC FUNCTION PANEL     Status: Abnormal   Collection Time    02/09/13  7:40 AM  Result Value Range   Total Protein 6.7  6.0 - 8.3 g/dL   Albumin 2.4 (*) 3.5 - 5.2 g/dL   AST 101 (*) 0 - 37 U/L   ALT 86 (*) 0 - 35 U/L   Alkaline Phosphatase 90  39 - 117 U/L   Total Bilirubin 0.3  0.3 - 1.2 mg/dL   Bilirubin, Direct 0.1  0.0 - 0.3 mg/dL   Indirect Bilirubin 0.2 (*) 0.3 - 0.9 mg/dL  URINALYSIS, ROUTINE W REFLEX MICROSCOPIC     Status: Abnormal   Collection Time    02/09/13  8:50 AM      Result Value Range   Color, Urine YELLOW  YELLOW   APPearance CLOUDY (*) CLEAR   Specific Gravity, Urine 1.018  1.005 - 1.030   pH 5.5  5.0 - 8.0   Glucose, UA 100 (*) NEGATIVE mg/dL   Hgb urine dipstick SMALL (*) NEGATIVE   Bilirubin Urine NEGATIVE  NEGATIVE   Ketones, ur NEGATIVE  NEGATIVE mg/dL   Protein, ur >300 (*) NEGATIVE mg/dL   Urobilinogen, UA 0.2  0.0 - 1.0 mg/dL   Nitrite NEGATIVE  NEGATIVE   Leukocytes, UA MODERATE (*) NEGATIVE  URINE MICROSCOPIC-ADD ON     Status: Abnormal   Collection Time    02/09/13  8:50 AM      Result Value Range   Squamous Epithelial / LPF RARE  RARE   WBC, UA 7-10  <3 WBC/hpf   Bacteria, UA FEW (*) RARE   Urine-Other TRICHOMONAS PRESENT    MRSA PCR SCREENING     Status: None   Collection Time    02/09/13 11:38 AM      Result Value Range   MRSA by PCR NEGATIVE  NEGATIVE   Comment:            The GeneXpert MRSA Assay (FDA     approved for NASAL specimens     only), is one component of a     comprehensive MRSA colonization     surveillance program. It is not     intended to diagnose MRSA     infection nor to guide or     monitor treatment for     MRSA infections.    Radiology  Reports: Dg Chest 2 View  02/09/2013   *RADIOLOGY REPORT*  Clinical Data: Mid chest pain.  Shortness of breath this morning. History of high blood pressure.  History of smoking.  CHEST - 2 VIEW  Comparison: 04/17/2011  Findings: Cardiomediastinal silhouette is within normal limits. The lungs are free of focal consolidations and pleural effusions. No pulmonary edema. Visualized osseous structures have a normal appearance.  IMPRESSION: Negative exam.   Original Report Authenticated By: Nolon Nations, M.D.   Ct Head Wo Contrast  02/09/2013   *RADIOLOGY REPORT*  Clinical Data: Headache, blurred vision  CT HEAD WITHOUT CONTRAST  Technique:  Contiguous axial images were obtained from the base of the skull through the vertex without contrast.  Comparison: 02/13/2008 MRI, 02/12/2008 CT  Findings: There is perisylvian fissure widening, in keeping with an atypical pattern of atrophy.  No intraparenchymal hemorrhage, mass, mass effect, or abnormal extra-axial fluid collection. Prominent left basal ganglia perivascular space is similar to prior.  Mild periventricular white matter hypodensities.  No CT evidence for acute infarction.  The visualized paranasal sinuses and mastoid air cells are predominately clear.  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  No CT evidence for acute intracranial abnormality.   Original Report Authenticated  By: Carlos Levering, M.D.    Electrocardiogram: EKG shows sinus rhythm and 99 beats per minute. Normal axis. Intervals are normal. No Q waves. No concerning ST or T-wave abnormalities are noted.  Problem List  Principal Problem:   Syncope Active Problems:   Trichomonal infection   UTI (lower urinary tract infection)   HA (headache)   Orthostatic hypotension   Dehydration   Transaminitis   DM type 2 (diabetes mellitus, type 2)   HTN (hypertension), benign   History of MRSA infection   Seizure disorder   Assessment: This is a 47 year old, African American  female, with multiple medical problems, who presents with syncopal episode. She has been having a headache for the past one week. She also has been lightheaded. She's found to have a urinary tract infection and trichomoniasis. She's found to be orthostatic. She is mildly dehydrated.  Plan: #1 syncopal episode with orthostatic hypotension: Quite possible this may be induced by dehydration and the UTI. We will monitor her on telemetry. Echocardiogram and carotid Dopplers will be ordered. She'll be given IV fluids. Her antihypertensive agents will be held for now. Orthostatics will be repeated. Due to history of seizure, disorder we'll get EEG as well.  #2 UTI with Trichomonal Infection: She has been given dose of Flagyl which will treat the trichomona. Will continue with ceftriaxone. Urine cultures will be followed up on.  #3 transaminitis: Apparently, she has a history of hepatitis C. She has had elevated liver enzymes in the past. Continue to monitor them. This can be further pursued as an outpatient. Check hepatitis panel to confirm.  #4 history of seizure disorder: Continue Keppra  #5 history of, diabetes, type II: Continue with sliding scale insulin. Lantus. HbA1c will be checked.  #6 normocytic anemia: Most likely due to chronic disease. Hemoglobin from [place] was 10.9 in April of this year. Continue to monitor. Check anemia panel. No overt bleeding.   DVT Prophylaxis: Lovenox Code Status: Full code Family Communication: Discussed with the patient in detail  Disposition Plan: To telemetry   Further management decisions will depend on results of further testing and patient's response to treatment.  [facility]  [facility] Pager [phone]  If 7PM-7AM, please contact night-coverage [email or web address] Password [email or web address]  02/09/2013, 1:26 PM

## 2013-02-10 DIAGNOSIS — M7989 Other specified soft tissue disorders: Secondary | ICD-10-CM

## 2013-02-10 DIAGNOSIS — M79609 Pain in unspecified limb: Secondary | ICD-10-CM

## 2013-02-10 LAB — GLUCOSE, CAPILLARY
Glucose-Capillary: 177 mg/dL — ABNORMAL HIGH (ref 70–99)
Glucose-Capillary: 179 mg/dL — ABNORMAL HIGH (ref 70–99)

## 2013-02-10 LAB — COMPREHENSIVE METABOLIC PANEL
AST: 76 U/L — ABNORMAL HIGH (ref 0–37)
Albumin: 1.9 g/dL — ABNORMAL LOW (ref 3.5–5.2)
Chloride: 106 mEq/L (ref 96–112)
Creatinine, Ser: 1.09 mg/dL (ref 0.50–1.10)
Potassium: 3.8 mEq/L (ref 3.5–5.1)
Total Bilirubin: 0.2 mg/dL — ABNORMAL LOW (ref 0.3–1.2)
Total Protein: 5.8 g/dL — ABNORMAL LOW (ref 6.0–8.3)

## 2013-02-10 LAB — CBC
Hemoglobin: 8.8 g/dL — ABNORMAL LOW (ref 12.0–15.0)
MCHC: 33.1 g/dL (ref 30.0–36.0)
RBC: 2.96 MIL/uL — ABNORMAL LOW (ref 3.87–5.11)
RDW: 14.2 % (ref 11.5–15.5)

## 2013-02-10 LAB — IRON AND TIBC: Iron: 56 ug/dL (ref 42–135)

## 2013-02-10 LAB — VITAMIN B12: Vitamin B-12: 725 pg/mL (ref 211–911)

## 2013-02-10 LAB — URINE CULTURE

## 2013-02-10 LAB — FOLATE: Folate: 14.7 ng/mL

## 2013-02-10 LAB — HEPATITIS PANEL, ACUTE
HCV Ab: REACTIVE — AB
Hep A IgM: NEGATIVE

## 2013-02-10 LAB — RETICULOCYTES
RBC.: 2.96 MIL/uL — ABNORMAL LOW (ref 3.87–5.11)
Retic Count, Absolute: 171.7 10*3/uL (ref 19.0–186.0)

## 2013-02-10 LAB — TROPONIN I: Troponin I: <0.3 ng/mL (ref <0.30)

## 2013-02-10 MED ORDER — OXYCODONE-ACETAMINOPHEN 5-325 MG PO TABS
1.0000 | ORAL_TABLET | ORAL | Status: DC | PRN
  Administered 2013-02-10 – 2013-02-11 (×2): 2 via ORAL
  Filled 2013-02-10 (×2): qty 2

## 2013-02-10 MED ORDER — DOXYCYCLINE HYCLATE 100 MG PO TABS
100.0000 mg | ORAL_TABLET | Freq: Two times a day (BID) | ORAL | Status: DC
  Administered 2013-02-10 – 2013-02-11 (×2): 100 mg via ORAL
  Filled 2013-02-10 (×3): qty 1

## 2013-02-10 NOTE — Progress Notes (Signed)
Patient adamantly refusing bed alarm.  I have educated patient regarding her syncopal episode, her positive orthostatics and hospital policy.  I have stated to her that she cannot predict when she will fall.  I have attempted to educate and persuade her otherwise.  Patient is extremely agitated stating she does not want the bed alarm set on the bed.  Patient has signed a sheet stating she does not want the bed alarm on even though it is for her own safety and against hospital policy. It has been signed and placed in her chart.

## 2013-02-10 NOTE — Progress Notes (Signed)
Inpatient Diabetes Program Recommendations  AACE/ADA: New Consensus Statement on Inpatient Glycemic Control (2013)  Target Ranges:  Prepandial:   less than 140 mg/dL      Peak postprandial:   less than 180 mg/dL (1-2 hours)      Critically ill patients:  140 - 180 mg/dL   Reason for Visit: A1C=9.2% which is improved from when patient was living in [place]. She does not have glucose meter so left Rx. For MD to sign on the front of shadow chart.  She states that she was taking Lantus 75 units q HS but that she cut back on dose after she kept having lows in the middle of the night.  CBG's look good in hospital.  Patient needs PCP to follow-up with after discharge.  She also needs information regarding Medicaid. Will place "Social Work" consult.  No recommendations at this time.

## 2013-02-10 NOTE — Progress Notes (Signed)
Echocardiogram 2D Echocardiogram has been performed.  [name] 02/10/2013, 12:08 PM

## 2013-02-10 NOTE — Progress Notes (Addendum)
Patient ID: [name], female   DOB: 02/19/1966, 47 y.o.   MRN: [id number] [facility] PROGRESS NOTE  [name] [id number] DOB: 02/05/1966 DOA: 02/09/2013 PCP: No primary provider on file.  Brief narrative: 47 y.o. female with a past medical history of hypertension, diabetes, MRSA infections in the past, seizure disorder who recently moved back to [place] from [place]. She was in her usual state of health till the morning of admission when she woke up and felt light headed, and subsequently passed out. She does not recall how long she has been with consciousness and and is not sure if she has had seizure episode. She is not aware of any specific prodromal symptoms. On admission, she has denies any specific focal neurological symptoms, no fevers, chills, abdominal concerns, no chest pain or shortness of breath, no recent seizure episodes. She did mention several days duration of urinary urgency and frequency, dysuria.    Principal Problem:   Syncope - in the setting of persistent orthostatic hypotension  - possible etiologies include dehydration, UTI, diabetes induced autonomic dysfunction - pt is not on any specific medication that is known to cause orthostasis  but Coreg could have contributed - antihypertensive regimen (Lisinopril and Coreg) on hold for now - will continue IVF, will need to follow up on 2 D ECHO - consulted neurologist over the phone and he has agreed with current regimen, point mentioned if diabetes induces which is possible this would be rather difficult to control  - however, maintaining adequate hydration and stable BP will be the key  Active Problems:   Trichomonal infection - already given dose of Flagyl   UTI (lower urinary tract infection) - continue Rocephin day #2/5 - follow upon urine culture   Orthostatic hypotension - unclear etiology as noted above, management as mentioned above    Transaminitis - possibly related to Hep C but  pattern is more suggestive of possible alcohol abuse and since now trending down not really sure that Hep C is entirely cause of this - repeat CMET in AM - review of records indicate that pt has history of drug abuse, cocaine, narcotic, possible alcohol  - several UDS in 2009 - 2010 positive for cocaine   DM type 2 (diabetes mellitus, type 2) - A1C > 9 suggestive of relatively poor diabetic control - continue insulin for now   HTN (hypertension), accelerated - uncontrolled, Lisinopril and COreg on hold - will add hydralazine PRN and may change to scheduled dose if pt tolerating well    Anemia of chronic disease - slight drop since admission and possible dilution component - will continue to monitor, no signs of active bleed - CBC in AM  Consultants:  Diabetic educator   Procedures/Studies:  Dg Chest 2 View 02/09/2013  Negative exam.     Ct Head Wo Contrast 02/09/2013   Peri-sylvian atrophy is similar to prior.  No CT evidence for acute intracranial abnormality.     Antibiotics:  Rocephin 7/8 -->  Code Status: Full Family Communication: Pt at bedside Disposition Plan: Home when medically stable  HPI/Subjective: No events overnight.   Objective: Filed Vitals:   02/10/13 0416 02/10/13 0417 02/10/13 0418 02/10/13 0419  BP: 183/97 148/82 120/86 119/82  Pulse: 106 106 111 114  Temp:      TempSrc: Oral     Resp:      Height:      Weight:      SpO2: 100%  Intake/Output Summary (Last 24 hours) at 02/10/13 1318 Last data filed at 02/10/13 0914  Gross per 24 hour  Intake 2385.83 ml  Output   1650 ml  Net 735.83 ml    Exam:   General:  Pt is alert, follows commands appropriately, not in acute distress  Cardiovascular: Regular rhythm, tachycardic, S1/S2, no murmurs, no rubs, no gallops  Respiratory: Clear to auscultation bilaterally, no wheezing, no crackles, no rhonchi  Abdomen: Soft, non tender, non distended, bowel sounds present, no guarding  Extremities:  No edema, pulses DP and PT palpable bilaterally  Neuro: Grossly nonfocal  Data Reviewed: Basic Metabolic Panel:  Recent Labs Lab 02/09/13 0740 02/09/13 1352 02/10/13 0445  NA 133*  --  138  K 3.8  --  3.8  CL 101  --  106  CO2 25  --  25  GLUCOSE 240*  --  112*  BUN 14  --  16  CREATININE 1.13*  --  1.09  CALCIUM 8.5  --  7.7*  MG  --  1.8  --    Liver Function Tests:  Recent Labs Lab 02/09/13 0740 02/10/13 0445  AST 101* 76*  ALT 86* 68*  ALKPHOS 90 88  BILITOT 0.3 0.2*  PROT 6.7 5.8*  ALBUMIN 2.4* 1.9*   CBC:  Recent Labs Lab 02/09/13 0740 02/10/13 0445  WBC 4.5 3.3*  NEUTROABS 2.8  --   HGB 9.3* 8.8*  HCT 27.5* 26.6*  MCV 87.9 89.9  PLT 222 189   Cardiac Enzymes:  Recent Labs Lab 02/09/13 1352 02/09/13 1906 02/10/13 0145  TROPONINI <0.30 <0.30 <0.30   CBG:  Recent Labs Lab 02/09/13 0737 02/09/13 1634 02/09/13 2117 02/10/13 0740 02/10/13 1132  GLUCAP 231* 188* 147* 120* 120*    Recent Results (from the past 240 hour(s))  MRSA PCR SCREENING     Status: None   Collection Time    02/09/13 11:38 AM      Result Value Range Status   MRSA by PCR NEGATIVE  NEGATIVE Final   Comment:            The GeneXpert MRSA Assay (FDA     approved for NASAL specimens     only), is one component of a     comprehensive MRSA colonization     surveillance program. It is not     intended to diagnose MRSA     infection nor to guide or     monitor treatment for     MRSA infections.    Scheduled Meds: . cefTRIAXone  IV  1 g Intravenous Q24H  . DULoxetine  120 mg Oral Daily  . enoxaparin  injection  40 mg Subcutaneous Q24H  . insulin aspart  0-15 Units Subcutaneous TID WC  . insulin aspart  0-5 Units Subcutaneous QHS  . insulin glargine  35 Units Subcutaneous QHS  . levETIRAcetam  1,000 mg Oral BID  . linagliptin  5 mg Oral Daily  . pantoprazole  40 mg Oral Daily  . pregabalin  75 mg Oral Daily   Continuous Infusions:  [name],  MD  [facility] Pager [phone]  If 7PM-7AM, please contact night-coverage [email or web address] Password [email or web address] 02/10/2013, 1:18 PM   LOS: 1 day

## 2013-02-10 NOTE — Evaluation (Signed)
Physical Therapy Evaluation Patient Details Name: [name] MRN: [id number] DOB: 19-Apr-1966 Today's Date: 02/10/2013 Time: [id number] PT Time Calculation (min): 10 min  PT Assessment / Plan / Recommendation History of Present Illness  admitted for syncopal episode with unconsciousness.  She has a h/o HTN, DM, seizure disorder, and cocaine abuse  Clinical Impression  Limited eval due to vascular lab waiting to perform procedure/testing on pt. On eval, pt was Min guard assist for mobility-able to ambulate ~150 feet while pushing IV pole. Pt c/o dizziness with head turns while ambulating but did not demonstrate LOB. Will plan to further assess balance and possible need for assistive device. Pt may benefit from continued PT in outpatient setting if balance issues persist.     PT Assessment  Patient needs continued PT services    Follow Up Recommendations  Outpatient PT (if symptoms persist)    Does the patient have the potential to tolerate intense rehabilitation      Barriers to Discharge        Equipment Recommendations   (to be determined)    Recommendations for Other Services OT consult   Frequency Min 3X/week    Precautions / Restrictions Precautions Precautions: Fall Restrictions Weight Bearing Restrictions: No   Pertinent Vitals/Pain L UE-7/10 with activity      Mobility  Bed Mobility Bed Mobility: Supine to Sit Supine to Sit: 7: Independent Transfers Transfers: Sit to Stand;Stand to Sit Sit to Stand: 7: Independent Stand to Sit: 7: Independent Ambulation/Gait Ambulation/Gait Assistance: 4: Min guard Ambulation Distance (Feet): 150 Feet Assistive device: None Ambulation/Gait Assistance Details: Began ambulation with pt holding onto IV pole, but progressed to pt unsupoorted. slightly unsteady at times, but no LOB. Pt reports some dizziness with head turns while ambulating.  Gait Pattern: Step-through pattern General Gait Details: Attempted to have pt perform  head turns while ambulating but pt distracted and very talkative. Performed head turns up and down-no wavering or LOB but pt did report some dizziness.     Exercises     PT Diagnosis: Difficulty walking  PT Problem List: Decreased mobility;Decreased balance PT Treatment Interventions: Balance training;Gait training     PT Goals(Current goals can be found in the care plan section) Acute Rehab PT Goals Patient Stated Goal: improve balance PT Goal Formulation: With patient Time For Goal Achievement: 02/24/13 Potential to Achieve Goals: Good  Visit Information  Last PT Received On: 02/10/13 Assistance Needed: +1 PT/OT Co-Evaluation/Treatment: Yes History of Present Illness: admitted for syncopal episode with unconsciousness.  She has a h/o HTN, DM, seizure disorder, and cocaine abuse       Prior Functioning  Home Living Family/patient expects to be discharged to:: Private residence Living Arrangements: [place]: None Additional Comments: states son is moving out Prior Function Level of Independence: Independent Comments: per pt, h/o dysequilibrium since august.  Has fallen.  Describes lightheadness and sometimes blurry vision.  Looking up bothers her Communication Communication: No difficulties    Cognition  Cognition Arousal/Alertness: Awake/alert Behavior During Therapy: WFL for tasks assessed/performed Overall Cognitive Status: Within Functional Limits for tasks assessed    Extremity/Trunk Assessment Upper Extremity Assessment Upper Extremity Assessment: Defer to OT evaluation LUE:  (NT:  swollen and ultrasound pending) Lower Extremity Assessment Lower Extremity Assessment: Overall WFL for tasks assessed Cervical / Trunk Assessment Cervical / Trunk Assessment: Normal   Balance    End of Session PT - End of Session Equipment Utilized During Treatment: Gait belt Activity Tolerance: Patient tolerated treatment well  Patient left: in bed;with call  [place]/phone within reach  GP Functional Assessment Tool Used: clinical judgement Functional Limitation: Mobility: Walking and moving around Mobility: Walking and Moving Around Current Status [phone]): At least 1 percent but less than 20 percent impaired, limited or restricted Mobility: Walking and Moving Around Goal Status [phone]): 0 percent impaired, limited or restricted   [name], MPT Pager: [phone]

## 2013-02-10 NOTE — Evaluation (Signed)
Occupational Therapy Evaluation Patient Details Name: [name] MRN: [id number] DOB: 18-Sep-1965 Today's Date: 02/10/2013 Time: [id number] OT Time Calculation (min): 13 min  OT Assessment / Plan / Recommendation History of present illness admitted for syncopal episode with unconsciousness.  She has a h/o HTN, DM, seizure disorder, and cocaine abuse   Clinical Impression   Pt was admitted for syncope.  She feels dysequilibrium when walking and used IV pole for support.  No LOB noted, but min guard provided for safety.  Goals in acute are for mod I level. Pt was independent prior to admission.      OT Assessment  Patient needs continued OT Services    Follow Up Recommendations  Other (comment) (possibly outpatient PT for balance, depending upon progress)    Barriers to Discharge      Equipment Recommendations   (? shower seat)    Recommendations for Other Services    Frequency  Min 2X/week    Precautions / Restrictions Precautions Precautions: Fall Restrictions Weight Bearing Restrictions: No   Pertinent Vitals/Pain *pt's RUE painful, not rated.  U/S tech present to perform study at end of therapy session    ADL  Grooming: Supervision/safety;Set up;Wash/dry hands Where Assessed - Grooming: Supported standing Toilet Transfer: Magazine features editor Method: Sit to Loss adjuster, chartered: Comfort height toilet Equipment Used:  (IV pole for support) Transfers/Ambulation Related to ADLs: Pt wanted to use IV pole for support.  Feels dysequilibrium.   ADL Comments: Overall set up for adls vs. min guard to gather clothes.  Pt came in with syncope but c/o dysequilibrium. lightheadness and blurry vision at times.  Will screen for vestibular on next visit.    OT Diagnosis: Generalized weakness  OT Problem List: Impaired balance (sitting and/or standing);Pain OT Treatment Interventions: Self-care/ADL training;DME and/or AE instruction;Patient/family education;Balance  training   OT Goals(Current goals can be found in the care plan section) Acute Rehab OT Goals Patient Stated Goal: improve balance OT Goal Formulation: With patient Time For Goal Achievement: 02/24/13 Potential to Achieve Goals: Good ADL Goals Pt Will Transfer to Toilet: with modified independence;regular height toilet Pt Will Perform Tub/Shower Transfer: with modified independence;Tub transfer;Shower transfer;shower seat Additional ADL Goal #1: Pt will gather clothes and complete adl at mod I level  Visit Information  Last OT Received On: 02/10/13 Assistance Needed: +1 PT/OT Co-Evaluation/Treatment: Yes History of Present Illness: admitted for syncopal episode with unconsciousness.  She has a h/o HTN, DM, seizure disorder, and cocaine abuse       Prior Functioning     Home Living Family/patient expects to be discharged to:: Private residence Living Arrangements: [place]: None Additional Comments: states son is moving out Prior Function Level of Independence: Independent Comments: per pt, h/o dysequilibrium since august.  Has fallen.  Describes lightheadness and sometimes blurry vision.  Looking up bothers her Communication Communication: No difficulties         Vision/Perception     Cognition  Cognition Arousal/Alertness: Awake/alert Behavior During Therapy: WFL for tasks assessed/performed Overall Cognitive Status: Within Functional Limits for tasks assessed    Extremity/Trunk Assessment Upper Extremity Assessment Upper Extremity Assessment: LUE deficits/detail;Overall WFL for tasks assessed (RUE) LUE:  (NT:  swollen and ultrasound pending) Cervical / Trunk Assessment Cervical / Trunk Assessment: Normal     Mobility Bed Mobility Bed Mobility: Supine to Sit Supine to Sit: 7: Independent Transfers Transfers: Sit to Stand;Stand to Sit Sit to Stand: 7: Independent Stand to Sit: 7: Independent  Exercise     Balance     End of Session  OT - End of Session Activity Tolerance: Patient tolerated treatment well (U/S technician came during eval:  will finish home questions) Patient left: in bed;with call [place]/phone within reach  GO Functional Assessment Tool Used: clinical observation/judgment Functional Limitation: Self care Self Care Current Status [id number]): At least 1 percent but less than 20 percent impaired, limited or restricted Self Care Goal Status [id number]): 0 percent impaired, limited or restricted   Jensine Luz 02/10/2013, 11:52 AM Lesle Chris, OTR/L [phone] 02/10/2013

## 2013-02-10 NOTE — Progress Notes (Signed)
*  PRELIMINARY RESULTS* Vascular Ultrasound Left upper extremity venous duplex has been completed.  Preliminary findings: left = negative for Deep and superficial thrombosis.   Landry Mellow, RDMS, RVT  02/10/2013, 11:10 AM

## 2013-02-11 LAB — COMPREHENSIVE METABOLIC PANEL
Alkaline Phosphatase: 92 U/L (ref 39–117)
BUN: 19 mg/dL (ref 6–23)
Chloride: 101 mEq/L (ref 96–112)
GFR calc Af Amer: 61 mL/min — ABNORMAL LOW (ref >90)
GFR calc non Af Amer: 53 mL/min — ABNORMAL LOW (ref >90)
Glucose, Bld: 235 mg/dL — ABNORMAL HIGH (ref 70–99)
Potassium: 4 mEq/L (ref 3.5–5.1)
Total Bilirubin: 0.2 mg/dL — ABNORMAL LOW (ref 0.3–1.2)

## 2013-02-11 LAB — CBC
HCT: 25.4 % — ABNORMAL LOW (ref 36.0–46.0)
Hemoglobin: 8.6 g/dL — ABNORMAL LOW (ref 12.0–15.0)
MCHC: 33.9 g/dL (ref 30.0–36.0)
MCV: 90.7 fL (ref 78.0–100.0)
WBC: 5.3 10*3/uL (ref 4.0–10.5)

## 2013-02-11 MED ORDER — OXYCODONE-ACETAMINOPHEN 10-325 MG PO TABS: 1.0000 | ORAL_TABLET | Freq: Four times a day (QID) | ORAL | Status: DC | PRN

## 2013-02-11 MED ORDER — INSULIN GLARGINE 100 UNIT/ML ~~LOC~~ SOLN: 45.0000 [IU] | Freq: Every day | SUBCUTANEOUS | Status: DC

## 2013-02-11 MED ORDER — DOXYCYCLINE HYCLATE 100 MG PO TABS: 100.0000 mg | ORAL_TABLET | Freq: Two times a day (BID) | ORAL | Status: DC

## 2013-02-11 NOTE — Discharge Summary (Signed)
Physician Discharge Summary  [name] [id number] DOB: 04/22/1966 DOA: 02/09/2013  PCP: No primary provider on file.  Admit date: 02/09/2013 Discharge date: 02/11/2013  Recommendations for Outpatient Follow-up:  1. Pt will need to follow up with PCP in 2-3 weeks post discharge 2. Please obtain BMP to evaluate electrolytes and kidney function 3. Please also check CBC to evaluate Hg and Hct levels 4. Please note that pt was advised to stop taking Coreg due to orthostatic hypotension 5. Pt also advised to schedule an appointment with neurologist in 2 weeks, information provided  6. [facility] and was told to call [phone] to set up an appointment to establish the PCP, information provided 7. Pt was discharged on Doxycycline to complete the therapy for left arm cellulitis for 7 more days post discharge   Discharge Diagnoses:  Principal Problem:   Syncope Active Problems:   Trichomonal infection   UTI (lower urinary tract infection)   HA (headache)   Orthostatic hypotension   Dehydration   Transaminitis   DM type 2 (diabetes mellitus, type 2)   HTN (hypertension), benign   History of MRSA infection   Seizure disorder  Discharge Condition: Stable  Diet recommendation: Heart healthy diet discussed in details   Brief narrative:  47 y.o. female with a past medical history of hypertension, diabetes, MRSA infections in the past, seizure disorder who recently moved back to [place] from [place]. She was in her usual state of health till the morning of admission when she woke up and felt light headed, and subsequently passed out. She does not recall how long she has been with consciousness and and is not sure if she has had seizure episode. She is not aware of any specific prodromal symptoms. On admission, she has denies any specific focal neurological symptoms, no fevers, chills, abdominal concerns, no chest pain or shortness of breath, no  recent seizure episodes. She did mention several days duration of urinary urgency and frequency, dysuria.   Principal Problem:  Syncope  - in the setting of persistent orthostatic hypotension  - possible etiologies include dehydration, UTI, diabetes induced autonomic dysfunction  - pt is not on any specific medication that is known to cause orthostasis but Coreg could have contributed  - please note that pt was advised to stop taking coreg until seen by PCP - 2 D ECHO with no specific findings that could explain orthostatic hypotension  - consulted neurologist over the phone and he has agreed with current regimen, point mentioned if diabetes induced which is possible, this would be rather difficult to control  - however, maintaining adequate hydration and stable BP will be the key  Active Problems:  Trichomonal infection  - already given dose of Flagyl  UTI (lower urinary tract infection)  - continue Rocephin day #3/3 - urine culture with no significant growth  Orthostatic hypotension  - unclear etiology as noted above, management as mentioned above  Transaminitis  - possibly related to Hep C but pattern is more suggestive of possible alcohol abuse and since now trending down not really sure that Hep C is entirely cause of this - review of records indicate that pt has history of drug abuse, cocaine, narcotic, possible alcohol  - several UDS in 2009 - 2010 positive for cocaine  DM type 2 (diabetes mellitus, type 2)  - A1C > 9 suggestive of relatively poor diabetic control  - continue insulin and januvia per home medical regimen - emphasized importance  of follow up with PCP HTN (hypertension), accelerated  - uncontrolled, Lisinopril and COreg on hold  - will add hydralazine PRN and may change to scheduled dose if pt tolerating well  Anemia of chronic disease  - slight drop since admission and possible dilution component  - no signs of active bleed   Consultants:  Diabetic educator   Procedures/Studies:  Dg Chest 2 View 02/09/2013 Negative exam.  Ct Head Wo Contrast 02/09/2013 Peri-sylvian atrophy is similar to prior. No CT evidence for acute intracranial abnormality.  Antibiotics:  Rocephin 7/8 --> 7/10 Doxycycline for 7 more days post discharge   Code Status: Full  Family Communication: Pt at bedside   Discharge Exam: Filed Vitals:   02/11/13 0534  BP: 109/70  Pulse: 102  Temp:   Resp:    Filed Vitals:   02/11/13 0525 02/11/13 0527 02/11/13 0530 02/11/13 0534  BP: 133/81 122/73 102/64 109/70  Pulse: 97 98 98 102  Temp: 98.2 F (36.8 C)     TempSrc: Oral     Resp: 18     Height:      Weight:      SpO2: 100% 100% 100% 100%    General: Pt is alert, follows commands appropriately, not in acute distress Cardiovascular: Regular rate and rhythm, S1/S2 +, no murmurs, no rubs, no gallops Respiratory: Clear to auscultation bilaterally, no wheezing, no crackles, no rhonchi Abdominal: Soft, non tender, non distended, bowel sounds +, no guarding Extremities: no edema, no cyanosis, pulses palpable bilaterally DP and PT Neuro: Grossly nonfocal  Discharge Instructions  Discharge Orders   Future Orders Complete By Expires     Diet - low sodium heart healthy  As directed     Increase activity slowly  As directed         Medication List    STOP taking these medications       carvedilol 6.25 MG tablet  Commonly known as:  COREG     sulfamethoxazole-trimethoprim 800-160 MG per tablet  Commonly known as:  BACTRIM DS      TAKE these medications       calcium carbonate 200 MG capsule  Take 250 mg by mouth daily.     clobetasol cream 0.05 %  Commonly known as:  TEMOVATE  Apply 1 application topically 2 (two) times daily. Apply two times daily to different spots on the body (affected area)     clonazePAM 1 MG tablet  Commonly known as:  KLONOPIN  Take 1 mg by mouth 2 (two) times daily.     doxycycline 100 MG tablet  Commonly known as:  VIBRA-TABS   Take 1 tablet (100 mg total) by mouth every 12 (twelve) hours.     DULoxetine 60 MG capsule  Commonly known as:  CYMBALTA  Take 120 mg by mouth daily.     fluticasone 50 MCG/ACT nasal spray  Commonly known as:  FLONASE  Place 1 spray into the nose daily as needed for rhinitis.     insulin aspart 100 UNIT/ML injection  Commonly known as:  novoLOG  Inject 2-20 Units into the skin 3 (three) times daily before meals. Sliding scale     insulin glargine 100 UNIT/ML injection  Commonly known as:  LANTUS  Inject 45 Units into the skin at bedtime.     levETIRAcetam 1000 MG tablet  Commonly known as:  KEPPRA  Take 1,000 mg by mouth 2 (two) times daily.     lisinopril 40 MG tablet  Commonly known  as:  PRINIVIL,ZESTRIL  Take 40 mg by mouth daily.     magnesium oxide 400 MG tablet  Commonly known as:  MAG-OX  Take 400 mg by mouth daily.     mupirocin ointment 2 %  Commonly known as:  BACTROBAN  Apply 1 application topically 3 (three) times daily. Apply to leg wounds     omeprazole 40 MG capsule  Commonly known as:  PRILOSEC  Take 40 mg by mouth daily.     oxyCODONE-acetaminophen 10-325 MG per tablet  Commonly known as:  PERCOCET  Take 1 tablet by mouth every 6 (six) hours as needed for pain.     polyethylene glycol powder powder  Commonly known as:  GLYCOLAX/MIRALAX  Take 17 g by mouth daily.     pregabalin 75 MG capsule  Commonly known as:  LYRICA  Take 75 mg by mouth daily.     sitaGLIPtin 50 MG tablet  Commonly known as:  JANUVIA  Take 50 mg by mouth daily.     vitamin C 500 MG tablet  Commonly known as:  ASCORBIC ACID  Take 500 mg by mouth daily.     vitamin E 400 UNIT capsule  Take 400 Units by mouth daily.     zolpidem 12.5 MG CR tablet  Commonly known as:  AMBIEN CR  Take 12.5 mg by mouth at bedtime.           Follow-up Information   Follow up with Ruth. Call in 2 weeks.   Contact information:   [address] [place] [address] [phone]      Follow up with Irwin Brakeman, MD. Call in 2 weeks.   Contact information:   [address] [place] [address] [phone]        The results of significant diagnostics from this hospitalization (including imaging, microbiology, ancillary and laboratory) are listed below for reference.     Microbiology: Recent Results (from the past 240 hour(s))  URINE CULTURE     Status: None   Collection Time    02/09/13  8:50 AM      Result Value Range Status   Specimen Description URINE, CLEAN CATCH   Final   Special Requests NONE   Final   Culture  Setup Time 02/09/2013 14:37   Final   Colony Count 45,000 COLONIES/ML   Final   Culture     Final   Value: Multiple bacterial morphotypes present, none predominant. Suggest appropriate recollection if clinically indicated.   Report Status 02/10/2013 FINAL   Final  MRSA PCR SCREENING     Status: None   Collection Time    02/09/13 11:38 AM      Result Value Range Status   MRSA by PCR NEGATIVE  NEGATIVE Final   Comment:            The GeneXpert MRSA Assay (FDA     approved for NASAL specimens     only), is one component of a     comprehensive MRSA colonization     surveillance program. It is not     intended to diagnose MRSA     infection nor to guide or     monitor treatment for     MRSA infections.     Labs: Basic Metabolic Panel:  Recent Labs Lab 02/09/13 0740 02/09/13 1352 02/10/13 0445 02/11/13 0453  NA 133*  --  138 131*  K 3.8  --  3.8 4.0  CL  101  --  106 101  CO2 25  --  25 24  GLUCOSE 240*  --  112* 235*  BUN 14  --  16 19  CREATININE 1.13*  --  1.09 1.21*  CALCIUM 8.5  --  7.7* 8.1*  MG  --  1.8  --   --    Liver Function Tests:  Recent Labs Lab 02/09/13 0740 02/10/13 0445 02/11/13 0453  AST 101* 76* 63*  ALT 86* 68* 63*  ALKPHOS 90 88 92  BILITOT 0.3 0.2* 0.2*  PROT 6.7 5.8* 6.2  ALBUMIN 2.4* 1.9* 2.1*   CBC:  Recent Labs Lab  02/09/13 0740 02/10/13 0445 02/11/13 0453  WBC 4.5 3.3* 5.3  NEUTROABS 2.8  --   --   HGB 9.3* 8.8* 8.6*  HCT 27.5* 26.6* 25.4*  MCV 87.9 89.9 90.7  PLT 222 189 206   Cardiac Enzymes:  Recent Labs Lab 02/09/13 1352 02/09/13 1906 02/10/13 0145  TROPONINI <0.30 <0.30 <0.30   CBG:  Recent Labs Lab 02/10/13 0740 02/10/13 1132 02/10/13 1659 02/10/13 2104 02/11/13 0722  GLUCAP 120* 120* 177* 179* 291*     SIGNED: Time coordinating discharge: Over 30 minutes  [name], MD  [facility] 02/11/2013, 2:26 PM Pager [phone]  If 7PM-7AM, please contact night-coverage [email or web address] Password [email or web address]

## 2013-02-11 NOTE — Progress Notes (Signed)
Inpatient Diabetes Program Recommendations  AACE/ADA: New Consensus Statement on Inpatient Glycemic Control (2013)  Target Ranges:  Prepandial:   less than 140 mg/dL      Peak postprandial:   less than 180 mg/dL (1-2 hours)      Critically ill patients:  140 - 180 mg/dL   Reason for Visit: Results for [name], [name] (MRN [id number]) as of 02/11/2013 13:41  Ref. Range 02/10/2013 07:40 02/10/2013 11:32 02/10/2013 16:59 02/10/2013 21:04 02/11/2013 07:22  Glucose-Capillary Latest Range: 70-99 mg/dL 120 (H) 120 (H) 177 (H) 179 (H) 291 (H)   A1C=9.2%.   Consider increasing Lantus to 45 units daily and add Novolog meal coverage 4 units tid with meals.

## 2013-02-11 NOTE — Progress Notes (Signed)
OT Cancellation Note  Patient Details Name: [name] MRN: [id number] DOB: 01-14-1966   Cancelled Treatment:    Reason Eval/Treat Not Completed: Patient declined, no reason specified. Will check back another day.    Keena Heesch 02/11/2013, 2:55 PM Lesle Chris, OTR/L [phone] 02/11/2013

## 2013-02-11 NOTE — Progress Notes (Signed)
PT Cancellation Note  Patient Details Name: [name] MRN: [id number] DOB: Dec 08, 1965   Cancelled Treatment:    Reason Eval/Treat Not Completed: Other (comment)  Pt on phone "trying to get out of here"  She said she is able to walk ok enough to get home   Norwood Levo 02/11/2013, 3:00 PM

## 2013-03-09 ENCOUNTER — Ambulatory Visit: Payer: Medicare Other | Admitting: Neurology

## 2013-04-07 ENCOUNTER — Ambulatory Visit: Payer: Medicare Other | Admitting: Neurology

## 2013-05-17 ENCOUNTER — Emergency Department (HOSPITAL_COMMUNITY): Payer: Medicare Other

## 2013-05-17 ENCOUNTER — Inpatient Hospital Stay (HOSPITAL_COMMUNITY)
Admission: EM | Admit: 2013-05-17 | Discharge: 2013-05-24 | DRG: 854 | Disposition: A | Discharge status: Home Health | Payer: Medicare Other | Attending: Internal Medicine | Admitting: Internal Medicine

## 2013-05-17 DIAGNOSIS — Z6829 Body mass index (BMI) 29.0-29.9, adult: Secondary | ICD-10-CM

## 2013-05-17 DIAGNOSIS — A419 Sepsis, unspecified organism: Principal | ICD-10-CM

## 2013-05-17 DIAGNOSIS — N179 Acute kidney failure, unspecified: Secondary | ICD-10-CM | POA: Diagnosis present

## 2013-05-17 DIAGNOSIS — Z531 Procedure and treatment not carried out because of patient's decision for reasons of belief and group pressure: Secondary | ICD-10-CM

## 2013-05-17 DIAGNOSIS — N185 Chronic kidney disease, stage 5: Secondary | ICD-10-CM | POA: Diagnosis present

## 2013-05-17 DIAGNOSIS — L97509 Non-pressure chronic ulcer of other part of unspecified foot with unspecified severity: Secondary | ICD-10-CM | POA: Diagnosis present

## 2013-05-17 DIAGNOSIS — F319 Bipolar disorder, unspecified: Secondary | ICD-10-CM | POA: Diagnosis present

## 2013-05-17 DIAGNOSIS — IMO0002 Reserved for concepts with insufficient information to code with codable children: Secondary | ICD-10-CM

## 2013-05-17 DIAGNOSIS — N183 Chronic kidney disease, stage 3 unspecified: Secondary | ICD-10-CM

## 2013-05-17 DIAGNOSIS — I10 Essential (primary) hypertension: Secondary | ICD-10-CM

## 2013-05-17 DIAGNOSIS — IMO0001 Reserved for inherently not codable concepts without codable children: Secondary | ICD-10-CM

## 2013-05-17 DIAGNOSIS — R739 Hyperglycemia, unspecified: Secondary | ICD-10-CM

## 2013-05-17 DIAGNOSIS — R7401 Elevation of levels of liver transaminase levels: Secondary | ICD-10-CM

## 2013-05-17 DIAGNOSIS — B182 Chronic viral hepatitis C: Secondary | ICD-10-CM

## 2013-05-17 DIAGNOSIS — E860 Dehydration: Secondary | ICD-10-CM

## 2013-05-17 DIAGNOSIS — E11621 Type 2 diabetes mellitus with foot ulcer: Secondary | ICD-10-CM | POA: Diagnosis present

## 2013-05-17 DIAGNOSIS — Z87891 Personal history of nicotine dependence: Secondary | ICD-10-CM

## 2013-05-17 DIAGNOSIS — E1169 Type 2 diabetes mellitus with other specified complication: Secondary | ICD-10-CM | POA: Diagnosis present

## 2013-05-17 DIAGNOSIS — L02619 Cutaneous abscess of unspecified foot: Secondary | ICD-10-CM | POA: Diagnosis present

## 2013-05-17 DIAGNOSIS — G40909 Epilepsy, unspecified, not intractable, without status epilepticus: Secondary | ICD-10-CM | POA: Diagnosis present

## 2013-05-17 DIAGNOSIS — R55 Syncope and collapse: Secondary | ICD-10-CM

## 2013-05-17 DIAGNOSIS — I129 Hypertensive chronic kidney disease with stage 1 through stage 4 chronic kidney disease, or unspecified chronic kidney disease: Secondary | ICD-10-CM | POA: Diagnosis present

## 2013-05-17 DIAGNOSIS — G589 Mononeuropathy, unspecified: Secondary | ICD-10-CM | POA: Diagnosis present

## 2013-05-17 DIAGNOSIS — Z794 Long term (current) use of insulin: Secondary | ICD-10-CM

## 2013-05-17 DIAGNOSIS — A599 Trichomoniasis, unspecified: Secondary | ICD-10-CM

## 2013-05-17 DIAGNOSIS — R630 Anorexia: Secondary | ICD-10-CM | POA: Diagnosis present

## 2013-05-17 DIAGNOSIS — E119 Type 2 diabetes mellitus without complications: Secondary | ICD-10-CM

## 2013-05-17 DIAGNOSIS — E876 Hypokalemia: Secondary | ICD-10-CM | POA: Diagnosis present

## 2013-05-17 DIAGNOSIS — M869 Osteomyelitis, unspecified: Secondary | ICD-10-CM | POA: Diagnosis present

## 2013-05-17 DIAGNOSIS — E1165 Type 2 diabetes mellitus with hyperglycemia: Secondary | ICD-10-CM

## 2013-05-17 DIAGNOSIS — Z91199 Patient's noncompliance with other medical treatment and regimen due to unspecified reason: Secondary | ICD-10-CM

## 2013-05-17 DIAGNOSIS — Z9119 Patient's noncompliance with other medical treatment and regimen: Secondary | ICD-10-CM

## 2013-05-17 DIAGNOSIS — R519 Headache, unspecified: Secondary | ICD-10-CM

## 2013-05-17 DIAGNOSIS — I951 Orthostatic hypotension: Secondary | ICD-10-CM

## 2013-05-17 DIAGNOSIS — E871 Hypo-osmolality and hyponatremia: Secondary | ICD-10-CM | POA: Diagnosis present

## 2013-05-17 DIAGNOSIS — Z8614 Personal history of Methicillin resistant Staphylococcus aureus infection: Secondary | ICD-10-CM | POA: Diagnosis present

## 2013-05-17 DIAGNOSIS — N390 Urinary tract infection, site not specified: Secondary | ICD-10-CM

## 2013-05-17 LAB — CBC WITH DIFFERENTIAL/PLATELET
Basophils Absolute: 0 10*3/uL (ref 0.0–0.1)
Basophils Relative: 0 % (ref 0–1)
Eosinophils Absolute: 0 10*3/uL (ref 0.0–0.7)
Eosinophils Relative: 0 % (ref 0–5)
Hemoglobin: 11.1 g/dL — ABNORMAL LOW (ref 12.0–15.0)
Lymphs Abs: 1.1 10*3/uL (ref 0.7–4.0)
MCH: 28.8 pg (ref 26.0–34.0)
MCHC: 35.6 g/dL (ref 30.0–36.0)
Neutrophils Relative %: 83 % — ABNORMAL HIGH (ref 43–77)
Platelets: 324 10*3/uL (ref 150–400)
RBC: 3.86 MIL/uL — ABNORMAL LOW (ref 3.87–5.11)

## 2013-05-17 LAB — POCT I-STAT TROPONIN I

## 2013-05-17 LAB — POCT I-STAT, CHEM 8
BUN: 10 mg/dL (ref 6–23)
Calcium, Ion: 1.04 mmol/L — ABNORMAL LOW (ref 1.12–1.23)
Chloride: 93 mEq/L — ABNORMAL LOW (ref 96–112)
Glucose, Bld: 348 mg/dL — ABNORMAL HIGH (ref 70–99)
HCT: 36 % (ref 36.0–46.0)

## 2013-05-17 LAB — COMPREHENSIVE METABOLIC PANEL
ALT: 20 U/L (ref 0–35)
Albumin: 1.6 g/dL — ABNORMAL LOW (ref 3.5–5.2)
Alkaline Phosphatase: 112 U/L (ref 39–117)
Potassium: 2.9 mEq/L — ABNORMAL LOW (ref 3.5–5.1)
Sodium: 124 mEq/L — ABNORMAL LOW (ref 135–145)
Total Protein: 6.9 g/dL (ref 6.0–8.3)

## 2013-05-17 LAB — CG4 I-STAT (LACTIC ACID): Lactic Acid, Venous: 1.62 mmol/L (ref 0.5–2.2)

## 2013-05-17 MED ORDER — LEVETIRACETAM 500 MG PO TABS
1000.0000 mg | ORAL_TABLET | Freq: Every day | ORAL | Status: DC
  Administered 2013-05-18 – 2013-05-23 (×7): 1000 mg via ORAL
  Filled 2013-05-17 (×9): qty 2

## 2013-05-17 MED ORDER — ONDANSETRON HCL 4 MG/2ML IJ SOLN
4.0000 mg | INTRAMUSCULAR | Status: AC
  Administered 2013-05-17: 4 mg via INTRAVENOUS
  Filled 2013-05-17: qty 2

## 2013-05-17 MED ORDER — INSULIN REGULAR BOLUS VIA INFUSION
0.0000 [IU] | Freq: Three times a day (TID) | INTRAVENOUS | Status: DC
  Filled 2013-05-17: qty 10

## 2013-05-17 MED ORDER — DEXTROSE 5 % IV SOLN
2.0000 g | Freq: Once | INTRAVENOUS | Status: AC
  Administered 2013-05-17: 2 g via INTRAVENOUS

## 2013-05-17 MED ORDER — SODIUM CHLORIDE 0.9 % IJ SOLN
3.0000 mL | Freq: Two times a day (BID) | INTRAMUSCULAR | Status: DC
  Administered 2013-05-20 – 2013-05-21 (×2): 3 mL via INTRAVENOUS

## 2013-05-17 MED ORDER — LISINOPRIL 40 MG PO TABS
40.0000 mg | ORAL_TABLET | Freq: Every day | ORAL | Status: DC
  Administered 2013-05-18: 40 mg via ORAL
  Filled 2013-05-17 (×2): qty 1

## 2013-05-17 MED ORDER — POTASSIUM CHLORIDE CRYS ER 20 MEQ PO TBCR
40.0000 meq | EXTENDED_RELEASE_TABLET | Freq: Once | ORAL | Status: AC
  Administered 2013-05-17: 40 meq via ORAL
  Filled 2013-05-17: qty 2

## 2013-05-17 MED ORDER — SODIUM CHLORIDE 0.9 % IV SOLN
INTRAVENOUS | Status: DC
  Administered 2013-05-18 – 2013-05-21 (×7): via INTRAVENOUS

## 2013-05-17 MED ORDER — POTASSIUM CHLORIDE 10 MEQ/100ML IV SOLN
10.0000 meq | INTRAVENOUS | Status: AC
  Administered 2013-05-18 (×3): 10 meq via INTRAVENOUS
  Filled 2013-05-17 (×4): qty 100

## 2013-05-17 MED ORDER — POTASSIUM CHLORIDE 10 MEQ/100ML IV SOLN
10.0000 meq | Freq: Once | INTRAVENOUS | Status: AC
  Administered 2013-05-17: 10 meq via INTRAVENOUS
  Filled 2013-05-17: qty 100

## 2013-05-17 MED ORDER — DULOXETINE HCL 60 MG PO CPEP
60.0000 mg | ORAL_CAPSULE | Freq: Every day | ORAL | Status: DC
  Administered 2013-05-18 – 2013-05-24 (×7): 60 mg via ORAL
  Filled 2013-05-17 (×7): qty 1

## 2013-05-17 MED ORDER — DEXTROSE 50 % IV SOLN: 25.0000 mL | INTRAVENOUS | Status: DC | PRN

## 2013-05-17 MED ORDER — DEXTROSE 5 % IV SOLN
2.0000 g | INTRAVENOUS | Status: DC
  Administered 2013-05-18 – 2013-05-23 (×6): 2 g via INTRAVENOUS
  Filled 2013-05-17 (×7): qty 2

## 2013-05-17 MED ORDER — SODIUM CHLORIDE 0.9 % IV SOLN
Freq: Once | INTRAVENOUS | Status: AC
  Administered 2013-05-17: 23:00:00 via INTRAVENOUS

## 2013-05-17 MED ORDER — SODIUM CHLORIDE 0.9 % IV BOLUS (SEPSIS)
1000.0000 mL | Freq: Once | INTRAVENOUS | Status: AC
  Administered 2013-05-17: 1000 mL via INTRAVENOUS

## 2013-05-17 MED ORDER — SODIUM CHLORIDE 0.9 % IV SOLN
INTRAVENOUS | Status: DC
  Administered 2013-05-18: via INTRAVENOUS

## 2013-05-17 MED ORDER — DEXTROSE-NACL 5-0.45 % IV SOLN: INTRAVENOUS | Status: DC

## 2013-05-17 MED ORDER — HEPARIN SODIUM (PORCINE) 5000 UNIT/ML IJ SOLN
5000.0000 [IU] | Freq: Three times a day (TID) | INTRAMUSCULAR | Status: DC
  Administered 2013-05-18 – 2013-05-24 (×17): 5000 [IU] via SUBCUTANEOUS
  Filled 2013-05-17 (×24): qty 1

## 2013-05-17 MED ORDER — MORPHINE SULFATE 4 MG/ML IJ SOLN
4.0000 mg | Freq: Once | INTRAMUSCULAR | Status: AC
  Administered 2013-05-17: 4 mg via INTRAVENOUS
  Filled 2013-05-17: qty 1

## 2013-05-17 MED ORDER — VANCOMYCIN HCL IN DEXTROSE 750-5 MG/150ML-% IV SOLN
750.0000 mg | Freq: Once | INTRAVENOUS | Status: AC
  Administered 2013-05-17: 750 mg via INTRAVENOUS
  Filled 2013-05-17: qty 150

## 2013-05-17 MED ORDER — SODIUM CHLORIDE 0.9 % IV SOLN
INTRAVENOUS | Status: DC
  Filled 2013-05-17: qty 1

## 2013-05-17 MED ORDER — VANCOMYCIN HCL IN DEXTROSE 750-5 MG/150ML-% IV SOLN
750.0000 mg | Freq: Two times a day (BID) | INTRAVENOUS | Status: DC
  Administered 2013-05-18 – 2013-05-19 (×2): 750 mg via INTRAVENOUS
  Filled 2013-05-17 (×5): qty 150

## 2013-05-17 MED ORDER — HYDROCODONE-ACETAMINOPHEN 5-325 MG PO TABS
1.0000 | ORAL_TABLET | ORAL | Status: DC | PRN
  Administered 2013-05-18 (×3): 1 via ORAL
  Filled 2013-05-17 (×3): qty 1

## 2013-05-17 NOTE — H&P (Signed)
[facility] History and Physical  [name] [id number] DOB: [birth year] DOA: 05/17/2013  Referring physician: ED PCP: No primary provider on file.   Chief Complaint: Left foot pain and abscess  HPI: [name] is a 47 y.o. female with DM2 who presents to the ED with cellulitis and abscess of her left foot.  Located on plantar and tracking through to dorsal surface.  Painful, hasnt tried anything to relieve pain yet.  Symptoms present for 2 weeks and worsening.  Has been taking lantus and bedtime but not her SSI.  Notes her BGLs have been getting worse especially recently.  In ED patiens foot noted to be obviously infected, patient tachycardic to 140s and has leukocytosis.  Is hypertensive and non-toxic appearing.  Review of Systems: 12 systems reviewed and otherwise negative.  Past Medical History  Diagnosis Date  . Diabetes mellitus   . Hepatitis C virus   . Neuropathy   . Glaucoma   . Bipolar 1 disorder    Past Surgical History  Procedure Laterality Date  . Abdominal hysterectomy     Social History:  reports that she has quit smoking. She has never used smokeless tobacco. She reports that she does not drink alcohol or use illicit drugs.   Allergies  Allergen Reactions  . Gabapentin Itching and Swelling  . Penicillins Hives    No family history on file.  Prior to Admission medications   Medication Sig Start Date End Date Taking? Authorizing Provider  doxycycline (VIBRA-TABS) 100 MG tablet Take 100 mg by mouth once.   Yes Historical Provider, MD  DULoxetine (CYMBALTA) 60 MG capsule Take 60 mg by mouth daily.    Yes Historical Provider, MD  insulin aspart (NOVOLOG) 100 UNIT/ML injection Inject 2-20 Units into the skin 3 (three) times daily before meals. Sliding scale   Yes Historical Provider, MD  insulin glargine (LANTUS) 100 UNIT/ML injection Inject 0.45 mLs (45 Units total) into the skin at bedtime. 02/11/13  Yes Theodis Blaze, MD  levETIRAcetam (KEPPRA)  1000 MG tablet Take 1,000 mg by mouth at bedtime.    Yes Historical Provider, MD  lisinopril (PRINIVIL,ZESTRIL) 40 MG tablet Take 40 mg by mouth daily.     Yes Historical Provider, MD  MAGNESIUM PO Take 1 tablet by mouth daily.   Yes Historical Provider, MD  vitamin C (ASCORBIC ACID) 500 MG tablet Take 500 mg by mouth daily.   Yes Historical Provider, MD  zolpidem (AMBIEN CR) 6.25 MG CR tablet Take 6.25 mg by mouth at bedtime as needed for sleep.   Yes Historical Provider, MD   Physical Exam: Filed Vitals:   05/17/13 2133  BP: 172/100  Pulse: 121  Temp:   Resp: 18    General:  NAD, resting comfortably in bed Eyes: PEERLA EOMI ENT: mucous membranes moist Neck: supple w/o JVD Cardiovascular: RRR w/o MRG Respiratory: CTA B Abdomen: soft, nt, nd, bs+ Skin: Patients L foot has diabetic foot ulcer on plantar aspect with wound tracking through to the dorsal aspect.  Obvious abscess, cellulitis, and infection are present. Musculoskeletal: MAE, full ROM all 4 extremities Psychiatric: normal tone and affect Neurologic: AAOx3, grossly non-focal  Labs on Admission:  Basic Metabolic Panel:  Recent Labs Lab 05/17/13 2003 05/17/13 2015  NA 124* 127*  K 2.9* 2.9*  CL 91* 93*  CO2 20  --   GLUCOSE 348* 348*  BUN 11 10  CREATININE 1.54* 1.80*  CALCIUM 7.5*  --    Liver Function Tests:  Recent Labs Lab 05/17/13 2003  AST 23  ALT 20  ALKPHOS 112  BILITOT 0.5  PROT 6.9  ALBUMIN 1.6*   No results found for this basename: LIPASE, AMYLASE,  in the last 168 hours No results found for this basename: AMMONIA,  in the last 168 hours CBC:  Recent Labs Lab 05/17/13 2003 05/17/13 2015  WBC 19.6*  --   NEUTROABS 16.3*  --   HGB 11.1* 12.2  HCT 31.2* 36.0  MCV 80.8  --   PLT 324  --    Cardiac Enzymes: No results found for this basename: CKTOTAL, CKMB, CKMBINDEX, TROPONINI,  in the last 168 hours  BNP (last 3 results) No results found for this basename: PROBNP,  in the last  8760 hours CBG:  Recent Labs Lab 05/17/13 2006  GLUCAP 326*    Radiological Exams on Admission: Dg Chest 2 View  05/17/2013   CLINICAL DATA:  hypertension and dyspnea on exertion  EXAM: CHEST  2 VIEW  COMPARISON:  February 09, 2013  FINDINGS: The lungs are clear. Heart size and pulmonary vascularity are normal. No adenopathy. No bone lesions. There are small cervical ribs bilaterally.  IMPRESSION: No edema or consolidation. Small cervical ribs bilaterally.   Electronically Signed   By: Lowella Grip M.D.   On: 05/17/2013 21:23   Dg Foot Complete Left  05/17/2013   CLINICAL DATA:  Left foot pain and swelling. Plantar ulcer. Diabetes.  EXAM: LEFT FOOT - COMPLETE 3+ VIEW  COMPARISON:  None.  FINDINGS: There is no evidence of fracture or dislocation. There is no evidence of arthropathy or other focal bone abnormality. No evidence of periostitis or osteolysis. No evidence of soft tissue gas. Small plantar calcaneal bone spur noted as well as mild peripheral vascular calcification.  IMPRESSION: No acute findings.  Small plantar calcaneal bone spur and mild peripheral vascular calcification.   Electronically Signed   By: Earle Gell M.D.   On: 05/17/2013 21:23    EKG: Independently reviewed.  Assessment/Plan Principal Problem:   Cellulitis and abscess of foot Active Problems:   DM type 2 (diabetes mellitus, type 2)   Hypokalemia   Diabetic foot ulcer   1. Cellulitis and abscess of foot - will need surgical consult for I+D in AM.  On Cefepime and vancomycin since patient is having systemic involvement with tachycardia and leukocytosis though she appears non-toxic on exam.  Infection and abscess of foot is obvious of physical exam.  Wound appears to be tracking from plantar aspect all the way through to the dorsal aspect.  No evidence of osteo on X-Ray. 2. DM2 - putting patient on insulin gtt to get this under better control overnight given her BGLs in the upper 300s in the ED. 3. Hypokalemia -  5 runs of IV potassium ordered for K of 2.9 (especially with insulin gtt).    Code Status: Full Code (must indicate code status--if unknown or must be presumed, indicate so) Family Communication: No family in room (indicate person spoken with, if applicable, with phone number if by telephone) Disposition Plan: Admit to inpatient (indicate anticipated LOS)  Time spent: 70 min  Aydyn Testerman M. [facility] Pager [phone]  If 7PM-7AM, please contact night-coverage [email or web address] Password [facility] 05/17/2013, 10:37 PM

## 2013-05-17 NOTE — ED Notes (Signed)
Per EMS pt presents with an ulcer on her left foot, EMS reports the ulcer looks like a puncture wound and is an open whole, EMS reports the wound is intact and not weeping. EMS reports her left foot is red and warm to the touch. Per EMS pt was tachycardic (140) and hypertensive upon arrival to the pt's home (180/120). Per EMS pt has a hx of diabetes and hepatitis C. Per EMS pt states she has not taken her insulin in about two weeks as she has not been eating well and her insulin was making her bottom out.

## 2013-05-17 NOTE — ED Provider Notes (Signed)
CSN: [id number]     Arrival date & time 05/17/13  1942 History   None    Chief Complaint  Patient presents with  . Hypertension  . Tachycardia  . Skin Ulcer   (Consider location/radiation/quality/duration/timing/severity/associated sxs/prior Treatment) HPI Comments: Patient is a 47 year old female with a history of diabetes mellitus and hepatitis C who presents for left foot pain x2 weeks. Patient states that symptoms have been gradually worsening since onset and constant. Pain is throbbing and intermittently sharp and radiating to her anterior left leg without any modifying factors. Patient denies taking anything for her pain. She admits to associated swelling of her left lower extremity and anorexia. Patient states she has been taking her Lantus each bedtime but has not been taking her sliding scale insulin for this reason. She denies associated fever, chest pain or shortness of breath, nausea or vomiting, abdominal pain, pallor, numbness or tingling, weakness of her left lower extremity, and red linear streaking. Patient was brought in via EMS and found to be tachycardic to 140 and hypertensive to 180/120 prior to arrival. Heart rate on presentation to the ED 128bpm; BP 175/89.  The history is provided by the patient. No language interpreter was used.    Past Medical History  Diagnosis Date  . Diabetes mellitus   . Hepatitis C virus   . Neuropathy   . Glaucoma   . Bipolar 1 disorder    Past Surgical History  Procedure Laterality Date  . Abdominal hysterectomy     No family history on file. History  Substance Use Topics  . Smoking status: Former Research scientist (life sciences)  . Smokeless tobacco: Never Used  . Alcohol Use: No   OB History   Grav Para Term Preterm Abortions TAB SAB Ect Mult Living                 Review of Systems  Constitutional: Negative for fever.  Respiratory: Negative for shortness of breath.   Cardiovascular: Positive for leg swelling. Negative for chest pain.   Gastrointestinal: Negative for nausea and vomiting.  Musculoskeletal: Positive for arthralgias, joint swelling and myalgias.  Skin: Positive for color change. Negative for pallor.  Neurological: Negative for weakness and numbness.  All other systems reviewed and are negative.    Allergies  Gabapentin and Penicillins  Home Medications   Current Outpatient Rx  Name  Route  Sig  Dispense  Refill  . doxycycline (VIBRA-TABS) 100 MG tablet   Oral   Take 100 mg by mouth once.         . DULoxetine (CYMBALTA) 60 MG capsule   Oral   Take 60 mg by mouth daily.          . insulin aspart (NOVOLOG) 100 UNIT/ML injection   Subcutaneous   Inject 2-20 Units into the skin 3 (three) times daily before meals. Sliding scale         . insulin glargine (LANTUS) 100 UNIT/ML injection   Subcutaneous   Inject 0.45 mLs (45 Units total) into the skin at bedtime.   10 mL   12   . levETIRAcetam (KEPPRA) 1000 MG tablet   Oral   Take 1,000 mg by mouth at bedtime.          Marland Kitchen lisinopril (PRINIVIL,ZESTRIL) 40 MG tablet   Oral   Take 40 mg by mouth daily.           Marland Kitchen MAGNESIUM PO   Oral   Take 1 tablet by mouth daily.         Marland Kitchen  vitamin C (ASCORBIC ACID) 500 MG tablet   Oral   Take 500 mg by mouth daily.         Marland Kitchen zolpidem (AMBIEN CR) 6.25 MG CR tablet   Oral   Take 6.25 mg by mouth at bedtime as needed for sleep.          BP 172/100  Pulse 121  Temp(Src) 98.6 F (37 C) (Oral)  Resp 18  Wt 165 lb (74.844 kg)  BMI 28.31 kg/m2  SpO2 100%  Physical Exam  Nursing note and vitals reviewed. Constitutional: She is oriented to person, place, and time. She appears well-developed and well-nourished. No distress.  HENT:  Head: Normocephalic and atraumatic.  Eyes: Conjunctivae and EOM are normal. No scleral icterus.  Neck: Normal range of motion.  Cardiovascular: Regular rhythm and normal heart sounds.  Tachycardia present.   Pulses:      Dorsalis pedis pulses are 2+ on the  right side, and 2+ on the left side.       Posterior tibial pulses are 2+ on the right side, and 2+ on the left side.  Pulmonary/Chest: Effort normal. No respiratory distress. She has no wheezes. She has no rales.  Abdominal: Soft. She exhibits no distension. There is no tenderness. There is no rebound and no guarding.  Musculoskeletal: Normal range of motion.       Left ankle: She exhibits swelling. She exhibits normal range of motion, no deformity, no laceration and normal pulse. Tenderness.       Left foot: She exhibits tenderness and swelling. She exhibits normal range of motion, normal capillary refill, no crepitus, no deformity and no laceration.  Pustule 3cm in diameter just proximal to 3-4th MTP joints on dorsal aspect of L foot with surrounding erythema and heat-to-touch; findings c/w abscess. Erythema extends on dorsal surface of foot to L ankle. 2+ pitting edema of L foot and ankle. Grade 1 ulcer appreciated on plantar aspect of L foot, proximal to 4th MTP joint; ulcer 1cm in diameter - question tracking from abscess on dorsal surface. No red linear streaking appreciated. +TTP. No sensory deficits or decreased ROM.  Neurological: She is alert and oriented to person, place, and time.  No sensory or motor deficits appreciated  Skin: Skin is warm and dry. No rash noted. She is not diaphoretic. No erythema. No pallor.  Psychiatric: She has a normal mood and affect. Her behavior is normal.    ED Course  Procedures (including critical care time) Labs Review Labs Reviewed  CBC WITH DIFFERENTIAL - Abnormal; Notable for the following:    WBC 19.6 (*)    RBC 3.86 (*)    Hemoglobin 11.1 (*)    HCT 31.2 (*)    Neutrophils Relative % 83 (*)    Neutro Abs 16.3 (*)    Lymphocytes Relative 6 (*)    Monocytes Absolute 2.1 (*)    All other components within normal limits  COMPREHENSIVE METABOLIC PANEL - Abnormal; Notable for the following:    Sodium 124 (*)    Potassium 2.9 (*)    Chloride 91  (*)    Glucose, Bld 348 (*)    Creatinine, Ser 1.54 (*)    Calcium 7.5 (*)    Albumin 1.6 (*)    GFR calc non Af Amer 39 (*)    GFR calc Af Amer 46 (*)    All other components within normal limits  GLUCOSE, CAPILLARY - Abnormal; Notable for the following:    Glucose-Capillary 326 (*)  All other components within normal limits  POCT I-STAT, CHEM 8 - Abnormal; Notable for the following:    Sodium 127 (*)    Potassium 2.9 (*)    Chloride 93 (*)    Creatinine, Ser 1.80 (*)    Glucose, Bld 348 (*)    Calcium, Ion 1.04 (*)    All other components within normal limits  CULTURE, BLOOD (ROUTINE X 2)  CULTURE, BLOOD (ROUTINE X 2)  WOUND CULTURE  POCT I-STAT TROPONIN I  CG4 I-STAT (LACTIC ACID)   Imaging Review Dg Chest 2 View  05/17/2013   CLINICAL DATA:  hypertension and dyspnea on exertion  EXAM: CHEST  2 VIEW  COMPARISON:  February 09, 2013  FINDINGS: The lungs are clear. Heart size and pulmonary vascularity are normal. No adenopathy. No bone lesions. There are small cervical ribs bilaterally.  IMPRESSION: No edema or consolidation. Small cervical ribs bilaterally.   Electronically Signed   By: Lowella Grip M.D.   On: 05/17/2013 21:23   Dg Foot Complete Left  05/17/2013   CLINICAL DATA:  Left foot pain and swelling. Plantar ulcer. Diabetes.  EXAM: LEFT FOOT - COMPLETE 3+ VIEW  COMPARISON:  None.  FINDINGS: There is no evidence of fracture or dislocation. There is no evidence of arthropathy or other focal bone abnormality. No evidence of periostitis or osteolysis. No evidence of soft tissue gas. Small plantar calcaneal bone spur noted as well as mild peripheral vascular calcification.  IMPRESSION: No acute findings.  Small plantar calcaneal bone spur and mild peripheral vascular calcification.   Electronically Signed   By: Earle Gell M.D.   On: 05/17/2013 21:23     Date: 05/17/2013  Rate: 127  Rhythm: sinus tachycardia  QRS Axis: normal  Intervals: normal  ST/T Wave abnormalities:  normal  Conduction Disutrbances:none  Narrative Interpretation: Sinus tachycardia; no STEMI  Old EKG Reviewed: no tachycardia on previous; otherwise unchaged from 02/09/2013   MDM   1. Sepsis   2. Cellulitis and abscess of foot   3. Hyperglycemia   4. Hypokalemia    48 year old female presents with cellulitis and abscess to her left foot worsening x2 weeks. Patient neurovascularly intact; not febrile on arrival, but she is tachycardic to 127 with a leukocytosis of 19.6. Patient also with hypokalemia which was repleted in ED today and hyperglycemia without evidence of metabolic acidosis. Patient meets sepsis criteria. Lactate ordered which is not elevated at this time. No evidence of osteomyelitis or sub-q gas on foot xray. She was treated with IV fluid bolus x3 in IV fluid infusion in addition to IV vancomycin and cefepime for broad coverage. Dr. Alcario Drought to admit to inpatient step down for further management. Temp hold orders placed.    Antonietta Breach, PA-C 05/17/13 2135  Antonietta Breach, PA-C 05/17/13 2206

## 2013-05-17 NOTE — ED Notes (Signed)
[id number] 

## 2013-05-17 NOTE — ED Provider Notes (Signed)
Medical screening examination/treatment/procedure(s) were conducted as a shared visit with non-physician practitioner(s) and myself.  I personally evaluated the patient during the encounter   Pt is a 47 y.o. female with a history of insulin-dependent diabetes who presents emergency department with left foot pain, swelling, drainage. Patient is tachycardic in the 140s and hypertensive. She is also hyperglycemic. She has a 2 x 4 cm abscess to her dorsal left foot with purulent drainage and has a tracking wound into the plantar aspect of the left foot. Patient has surrounding erythema, warmth and induration of her dorsal foot into her distal left calf.  Labs show a significant leukocytosis with left shift. Patient is also hyponatremic and hypokalemic. Will give vancomycin, cefepime, IV fluids, pain medication, replace potassium. Patient will need admission and likely orthopedic/wound care consult as an inpatient.   Maynard, DO 05/17/13 2341

## 2013-05-17 NOTE — ED Notes (Signed)
Admitting MD at bedside.

## 2013-05-17 NOTE — Progress Notes (Signed)
ANTIBIOTIC CONSULT NOTE - INITIAL  Pharmacy Consult for vancomycin and cefepime Indication: left foot infection, r/o osteomyelitis   Allergies  Allergen Reactions  . Gabapentin Itching and Swelling  . Penicillins Hives    Patient Measurements: Weight: 165 lb (74.844 kg)   Vital Signs: Temp: 98.6 F (37 C) (10/13 1958) Temp src: Oral (10/13 1958) BP: 175/89 mmHg (10/13 1958) Pulse Rate: 128 (10/13 1958) Intake/Output from previous day:   Intake/Output from this shift: Total I/O In: 1000 [I.V.:1000] Out: -   Labs:  Recent Labs  05/17/13 2003 05/17/13 2015  WBC 19.6*  --   HGB 11.1* 12.2  PLT 324  --   CREATININE 1.54* 1.80*   The CrCl is unknown because both a height and weight (above a minimum accepted value) are required for this calculation. No results found for this basename: VANCOTROUGH, VANCOPEAK, VANCORANDOM, GENTTROUGH, GENTPEAK, GENTRANDOM, TOBRATROUGH, TOBRAPEAK, TOBRARND, AMIKACINPEAK, AMIKACINTROU, AMIKACIN,  in the last 72 hours   Microbiology: No results found for this or any previous visit (from the past 720 hour(s)).  Medical History: Past Medical History  Diagnosis Date  . Diabetes mellitus   . Hepatitis C virus   . Neuropathy   . Glaucoma   . Bipolar 1 disorder     Medications:  See med rec  Assessment: Patient's a 47 y.o F presented to the ED with c/o pain, swelling, and drainage from left foot ulcer.  To start broad abx with vancomycin and cefepime for foot infection.  Patient does have a PCN allergy but she tolerated rocephin back in July of this year.  Goal of Therapy:  Vancomycin trough level 15-20 mcg/ml  Plan:  1) vancomycin 750mg  IV q12h 2) cefepime 2gm IV q24h  Aldene Hendon P 05/17/2013,9:03 PM

## 2013-05-17 NOTE — ED Notes (Signed)
Phlebotomy called and informed we need blood cultures drawn.

## 2013-05-17 NOTE — ED Notes (Signed)
Phlebotomy informed we need blood cultures drawn so we can give the pt antibiotics.

## 2013-05-18 ENCOUNTER — Encounter (HOSPITAL_COMMUNITY): Payer: Self-pay

## 2013-05-18 DIAGNOSIS — L97509 Non-pressure chronic ulcer of other part of unspecified foot with unspecified severity: Secondary | ICD-10-CM

## 2013-05-18 DIAGNOSIS — I10 Essential (primary) hypertension: Secondary | ICD-10-CM

## 2013-05-18 DIAGNOSIS — N185 Chronic kidney disease, stage 5: Secondary | ICD-10-CM | POA: Diagnosis present

## 2013-05-18 DIAGNOSIS — N183 Chronic kidney disease, stage 3 unspecified: Secondary | ICD-10-CM

## 2013-05-18 DIAGNOSIS — L02619 Cutaneous abscess of unspecified foot: Secondary | ICD-10-CM

## 2013-05-18 DIAGNOSIS — E1169 Type 2 diabetes mellitus with other specified complication: Secondary | ICD-10-CM

## 2013-05-18 LAB — BASIC METABOLIC PANEL
BUN: 10 mg/dL (ref 6–23)
CO2: 20 mEq/L (ref 19–32)
CO2: 21 mEq/L (ref 19–32)
Calcium: 6.7 mg/dL — ABNORMAL LOW (ref 8.4–10.5)
Chloride: 97 mEq/L (ref 96–112)
Creatinine, Ser: 1.25 mg/dL — ABNORMAL HIGH (ref 0.50–1.10)
GFR calc Af Amer: 59 mL/min — ABNORMAL LOW (ref >90)
GFR calc Af Amer: 46 mL/min — ABNORMAL LOW (ref >90)
GFR calc non Af Amer: 51 mL/min — ABNORMAL LOW (ref >90)
GFR calc non Af Amer: 40 mL/min — ABNORMAL LOW (ref >90)
Glucose, Bld: 337 mg/dL — ABNORMAL HIGH (ref 70–99)
Potassium: 3.9 mEq/L (ref 3.5–5.1)
Potassium: 4 mEq/L (ref 3.5–5.1)
Sodium: 129 mEq/L — ABNORMAL LOW (ref 135–145)
Sodium: 128 mEq/L — ABNORMAL LOW (ref 135–145)

## 2013-05-18 LAB — GLUCOSE, CAPILLARY
Glucose-Capillary: 174 mg/dL — ABNORMAL HIGH (ref 70–99)
Glucose-Capillary: 233 mg/dL — ABNORMAL HIGH (ref 70–99)
Glucose-Capillary: 240 mg/dL — ABNORMAL HIGH (ref 70–99)
Glucose-Capillary: 269 mg/dL — ABNORMAL HIGH (ref 70–99)
Glucose-Capillary: 307 mg/dL — ABNORMAL HIGH (ref 70–99)
Glucose-Capillary: 313 mg/dL — ABNORMAL HIGH (ref 70–99)
Glucose-Capillary: 83 mg/dL (ref 70–99)

## 2013-05-18 LAB — CBC
HCT: 25.1 % — ABNORMAL LOW (ref 36.0–46.0)
Hemoglobin: 8.9 g/dL — ABNORMAL LOW (ref 12.0–15.0)
MCHC: 35.5 g/dL (ref 30.0–36.0)
Platelets: 301 10*3/uL (ref 150–400)
RBC: 3.1 MIL/uL — ABNORMAL LOW (ref 3.87–5.11)
RDW: 12.9 % (ref 11.5–15.5)
WBC: 19.3 10*3/uL — ABNORMAL HIGH (ref 4.0–10.5)

## 2013-05-18 LAB — MRSA PCR SCREENING: MRSA by PCR: NEGATIVE

## 2013-05-18 MED ORDER — SODIUM CHLORIDE 0.9 % IJ SOLN
10.0000 mL | INTRAMUSCULAR | Status: DC | PRN
  Administered 2013-05-18 – 2013-05-20 (×3): 10 mL
  Administered 2013-05-20: 20 mL
  Administered 2013-05-21: 05:00:00 10 mL
  Administered 2013-05-21: 09:00:00 20 mL
  Administered 2013-05-22: 10 mL
  Administered 2013-05-24: 20 mL

## 2013-05-18 MED ORDER — INFLUENZA VAC SPLIT QUAD 0.5 ML IM SUSP
0.5000 mL | INTRAMUSCULAR | Status: AC
  Administered 2013-05-19: 0.5 mL via INTRAMUSCULAR
  Filled 2013-05-18: qty 0.5

## 2013-05-18 MED ORDER — INSULIN GLARGINE 100 UNIT/ML ~~LOC~~ SOLN
20.0000 [IU] | Freq: Once | SUBCUTANEOUS | Status: AC
  Administered 2013-05-18: 20 [IU] via SUBCUTANEOUS
  Filled 2013-05-18: qty 0.2

## 2013-05-18 MED ORDER — SODIUM CHLORIDE 0.9 % IJ SOLN: 10.0000 mL | Freq: Two times a day (BID) | INTRAMUSCULAR | Status: DC

## 2013-05-18 MED ORDER — INSULIN ASPART 100 UNIT/ML ~~LOC~~ SOLN
0.0000 [IU] | SUBCUTANEOUS | Status: DC
  Administered 2013-05-18: 3 [IU] via SUBCUTANEOUS
  Administered 2013-05-18: 2 [IU] via SUBCUTANEOUS
  Administered 2013-05-19: 04:00:00 5 [IU] via SUBCUTANEOUS
  Administered 2013-05-19: 01:00:00 3 [IU] via SUBCUTANEOUS
  Administered 2013-05-19: 2 [IU] via SUBCUTANEOUS

## 2013-05-18 MED ORDER — HYDROCODONE-ACETAMINOPHEN 5-325 MG PO TABS
1.0000 | ORAL_TABLET | ORAL | Status: DC | PRN
  Administered 2013-05-18: 1 via ORAL
  Administered 2013-05-19 – 2013-05-22 (×5): 2 via ORAL
  Administered 2013-05-22: 1 via ORAL
  Administered 2013-05-22 – 2013-05-23 (×2): 2 via ORAL
  Filled 2013-05-18 (×3): qty 2
  Filled 2013-05-18: qty 1
  Filled 2013-05-18 (×3): qty 2

## 2013-05-18 MED ORDER — INSULIN ASPART 100 UNIT/ML ~~LOC~~ SOLN
5.0000 [IU] | Freq: Once | SUBCUTANEOUS | Status: AC
  Administered 2013-05-18: 5 [IU] via SUBCUTANEOUS

## 2013-05-18 MED ORDER — CARVEDILOL 6.25 MG PO TABS
6.2500 mg | ORAL_TABLET | Freq: Two times a day (BID) | ORAL | Status: DC
  Administered 2013-05-18 – 2013-05-24 (×10): 6.25 mg via ORAL
  Filled 2013-05-18 (×16): qty 1

## 2013-05-18 NOTE — Progress Notes (Signed)
Peripherally Inserted Central Catheter/Midline Placement  The IV Nurse has discussed with the patient and/or persons authorized to consent for the patient, the purpose of this procedure and the potential benefits and risks involved with this procedure.  The benefits include less needle sticks, lab draws from the catheter and patient may be discharged home with the catheter.  Risks include, but not limited to, infection, bleeding, blood clot (thrombus formation), and puncture of an artery; nerve damage and irregular heat beat.  Alternatives to this procedure were also discussed.  PICC/Midline Placement Documentation        Darlyn Read 05/18/2013, 12:26 PM

## 2013-05-18 NOTE — Progress Notes (Addendum)
[facility] Progress Note  TEAM 1 - Stepdown/ICU TEAM   [name] [id number] DOB: 12-06-1965 DOA: 05/17/2013 PCP: Leeroy Cha  Brief narrative: 47 y/o female with DM, Hep C cirrhosis, Bipolar disorder, Neuropathy, h/o crack/cocaine use in the past who recently moved here from [place]. She states she was suing a physician for malpractice and moved up her because her family would support her. However, she has not had a specific place to stay since coming up here.  She has been seeing a Dr at [facility] for her medical issues but tells me she no longer has transport to get up there - also states she needs to call for a ride 2 days in advance and apparently did not want do this-It should be noted that the PCP did not prescribe her any narcotics or benzos which she has been wanting.  She was living with her sister after coming here and is now with a Nephew. Essentially, she is not sure if she can stay with this person for very long.  She also states she was at the hospital in [place] and was diagnosed with CHF and discharged on 17 meds- she decided it was too many for her and "weaned" herself off of most of them.  She started having left foot pain 2 wks ago- noted a week ago that she had an ulcer on the bottom of the foot. She developed an ulcer on the top of the foot  Assessment/Plan: Principal Problem:   Cellulitis and abscess of foot - h/o MRSA- cont Vanc and Cefepime - consult ortho- I spoke with Dr Sharol Given who recommended an MRI  Active Problems:   DM type 2 (diabetes mellitus, type 2) - last A1c in 7/14 was 9.2 - follow on current dose    HTN (hypertension), benign - add Coreg- she was on this in the past but it was stopped on the last admission due to hypotension  Hyponatremia - check urine sodium/ osm- note pt is on NS and this may alter results    Seizure disorder - cont Keppra    Hypokalemia - replaced  MRSA abscesses - has been  treated on and off with Doxy- last prescribed on 9/19 by [facility]- pt did not f/u after this appt    Hep C w/o coma, chronic - cirrhosis?? Check Alb, pt/INR   CKD 3    Code Status: full code Family Communication: none Disposition Plan: transfer to med/surg  Consultants: Ortho called  Procedures: none  Antibiotics: VAnc/ Cefepime- 10/13  DVT prophylaxis: Heparin  HPI/Subjective: Alert, c/o uncontrolled foot pain- asking to increase pain meds- concerned about the spots she has been having all over her body and thinks she is developing a new one (likley MRSA abcesses) - explained she is on IV antibiotics to cover this.  See also H and P above regarding information I have obtained.    Objective: Blood pressure 145/84, pulse 113, temperature 98.7 F (37.1 C), temperature source Oral, resp. rate 20, height 5\' 4"  (1.626 m), weight 78.3 kg (172 lb 9.9 oz), SpO2 100.00%.  Intake/Output Summary (Last 24 hours) at 05/18/13 1604 Last data filed at 05/18/13 0700  Gross per 24 hour  Intake   5025 ml  Output    550 ml  Net   4475 ml     Exam: General: No acute respiratory distress Lungs: Clear to auscultation bilaterally without wheezes or crackles Cardiovascular: Regular rate and rhythm without murmur gallop or rub  normal S1 and S2 Abdomen: Nontender, nondistended, soft, bowel sounds positive, no rebound, no ascites, no appreciable mass Extremities: No significant cyanosis, clubbing- has edema left lower extremitie Skin: ulcer on bottom and top of left foot- mild bloody discharge noted, scars all over her body (likely healed abscesses)   Data Reviewed: Basic Metabolic Panel:  Recent Labs Lab 05/17/13 2003 05/17/13 2015 05/18/13 0055 05/18/13 0545  NA 124* 127* 129* 128*  K 2.9* 2.9* 3.9 4.0  CL 91* 93* 97 98  CO2 20  --  20 21  GLUCOSE 348* 348* 337* 227*  BUN 11 10 10 11   CREATININE 1.54* 1.80* 1.25* 1.52*  CALCIUM 7.5*  --  6.7* 6.9*   Liver Function  Tests:  Recent Labs Lab 05/17/13 2003  AST 23  ALT 20  ALKPHOS 112  BILITOT 0.5  PROT 6.9  ALBUMIN 1.6*   No results found for this basename: LIPASE, AMYLASE,  in the last 168 hours No results found for this basename: AMMONIA,  in the last 168 hours CBC:  Recent Labs Lab 05/17/13 2003 05/17/13 2015 05/18/13 0545  WBC 19.6*  --  19.3*  NEUTROABS 16.3*  --   --   HGB 11.1* 12.2 8.9*  HCT 31.2* 36.0 25.1*  MCV 80.8  --  81.0  PLT 324  --  301   Cardiac Enzymes: No results found for this basename: CKTOTAL, CKMB, CKMBINDEX, TROPONINI,  in the last 168 hours BNP (last 3 results) No results found for this basename: PROBNP,  in the last 8760 hours CBG:  Recent Labs Lab 05/17/13 2342 05/18/13 0243 05/18/13 0410 05/18/13 0828 05/18/13 1334  GLUCAP 269* 313* 307* 174* 83    Recent Results (from the past 240 hour(s))  WOUND CULTURE     Status: None   Collection Time    05/17/13  9:35 PM      Result Value Range Status   Specimen Description WOUND LEFT FOOT   Final   Special Requests NONE   Final   Gram Stain     Final   Value: NO WBC SEEN     NO SQUAMOUS EPITHELIAL CELLS SEEN     MODERATE GRAM POSITIVE COCCI IN PAIRS     Performed at [facility]   Culture     Final   Value: Culture reincubated for better growth     Performed at [facility]   Report Status PENDING   Incomplete  MRSA PCR SCREENING     Status: None   Collection Time    05/18/13 12:55 AM      Result Value Range Status   MRSA by PCR NEGATIVE  NEGATIVE Final   Comment:            The GeneXpert MRSA Assay (FDA     approved for NASAL specimens     only), is one component of a     comprehensive MRSA colonization     surveillance program. It is not     intended to diagnose MRSA     infection nor to guide or     monitor treatment for     MRSA infections.     Studies:  Recent x-ray studies have been reviewed in detail by the Attending Physician  Scheduled Meds:  Scheduled  Meds: . carvedilol  6.25 mg Oral BID WC  . ceFEPime (MAXIPIME) IV  2 g Intravenous Q24H  . DULoxetine  60 mg Oral Daily  . heparin  5,000 Units  Subcutaneous Q8H  . [START ON 05/19/2013] influenza vac split quadrivalent PF  0.5 mL Intramuscular Tomorrow-1000  . insulin aspart  0-9 Units Subcutaneous Q4H  . levETIRAcetam  1,000 mg Oral QHS  . lisinopril  40 mg Oral Daily  . sodium chloride  10-40 mL Intracatheter Q12H  . sodium chloride  3 mL Intravenous Q12H  . vancomycin  750 mg Intravenous Q12H   Continuous Infusions: . sodium chloride      Time spent on care of this patient: 60 min   Liscomb, MD  [facility] Office  [phone] Pager - Text Page per Shea Evans as per below:  On-Call/Text Page:      [email or web address]      password [email or web address]  If 7PM-7AM, please contact night-coverage [email or web address] Password [email or web address] 05/18/2013, 4:04 PM   LOS: 1 day

## 2013-05-18 NOTE — Progress Notes (Signed)
Patient was transferred from 3S. Oriented to room connected to [place] [facility]. Has draining purulent diabetic wounds on dorsal and plantar of L foot. Measured and documented.

## 2013-05-18 NOTE — Progress Notes (Signed)
Utilization review completed.  

## 2013-05-19 ENCOUNTER — Inpatient Hospital Stay (HOSPITAL_COMMUNITY): Payer: Medicare Other

## 2013-05-19 DIAGNOSIS — N179 Acute kidney failure, unspecified: Secondary | ICD-10-CM

## 2013-05-19 DIAGNOSIS — E871 Hypo-osmolality and hyponatremia: Secondary | ICD-10-CM | POA: Diagnosis present

## 2013-05-19 LAB — CBC
HCT: 22.3 % — ABNORMAL LOW (ref 36.0–46.0)
Hemoglobin: 7.8 g/dL — ABNORMAL LOW (ref 12.0–15.0)
MCH: 28.5 pg (ref 26.0–34.0)
MCV: 81.4 fL (ref 78.0–100.0)
Platelets: 261 10*3/uL (ref 150–400)
RBC: 2.74 MIL/uL — ABNORMAL LOW (ref 3.87–5.11)
RDW: 13.4 % (ref 11.5–15.5)
WBC: 14.5 10*3/uL — ABNORMAL HIGH (ref 4.0–10.5)

## 2013-05-19 LAB — SEDIMENTATION RATE: Sed Rate: 100 mm/hr — ABNORMAL HIGH (ref 0–22)

## 2013-05-19 LAB — GLUCOSE, CAPILLARY
Glucose-Capillary: 161 mg/dL — ABNORMAL HIGH (ref 70–99)
Glucose-Capillary: 229 mg/dL — ABNORMAL HIGH (ref 70–99)
Glucose-Capillary: 254 mg/dL — ABNORMAL HIGH (ref 70–99)
Glucose-Capillary: 312 mg/dL — ABNORMAL HIGH (ref 70–99)
Glucose-Capillary: 321 mg/dL — ABNORMAL HIGH (ref 70–99)

## 2013-05-19 LAB — COMPREHENSIVE METABOLIC PANEL
AST: 16 U/L (ref 0–37)
BUN: 18 mg/dL (ref 6–23)
CO2: 19 mEq/L (ref 19–32)
Chloride: 98 mEq/L (ref 96–112)
Creatinine, Ser: 2.06 mg/dL — ABNORMAL HIGH (ref 0.50–1.10)
GFR calc non Af Amer: 28 mL/min — ABNORMAL LOW (ref >90)
Potassium: 3.4 mEq/L — ABNORMAL LOW (ref 3.5–5.1)
Total Bilirubin: 0.2 mg/dL — ABNORMAL LOW (ref 0.3–1.2)
Total Protein: 5.3 g/dL — ABNORMAL LOW (ref 6.0–8.3)

## 2013-05-19 LAB — CREATININE, URINE, RANDOM: Creatinine, Urine: 91.73 mg/dL

## 2013-05-19 LAB — OSMOLALITY, URINE: Osmolality, Ur: 198 mOsm/kg — ABNORMAL LOW (ref 390–1090)

## 2013-05-19 MED ORDER — IBUPROFEN 800 MG PO TABS
800.0000 mg | ORAL_TABLET | Freq: Once | ORAL | Status: AC
  Administered 2013-05-19: 02:00:00 800 mg via ORAL
  Filled 2013-05-19: qty 1

## 2013-05-19 MED ORDER — TETANUS-DIPHTH-ACELL PERTUSSIS 5-2.5-18.5 LF-MCG/0.5 IM SUSP
0.5000 mL | Freq: Once | INTRAMUSCULAR | Status: AC
  Administered 2013-05-19: 16:00:00 0.5 mL via INTRAMUSCULAR
  Filled 2013-05-19: qty 0.5

## 2013-05-19 MED ORDER — ADULT MULTIVITAMIN W/MINERALS CH
1.0000 | ORAL_TABLET | Freq: Every day | ORAL | Status: DC
  Administered 2013-05-19 – 2013-05-24 (×6): 1 via ORAL
  Filled 2013-05-19 (×6): qty 1

## 2013-05-19 MED ORDER — SENNOSIDES-DOCUSATE SODIUM 8.6-50 MG PO TABS
1.0000 | ORAL_TABLET | Freq: Every day | ORAL | Status: DC
  Administered 2013-05-19 – 2013-05-23 (×5): 1 via ORAL
  Filled 2013-05-19 (×5): qty 1

## 2013-05-19 MED ORDER — LIVING WELL WITH DIABETES BOOK
Freq: Once | Status: AC
  Administered 2013-05-19: 14:00:00
  Filled 2013-05-19: qty 1

## 2013-05-19 MED ORDER — INSULIN ASPART 100 UNIT/ML ~~LOC~~ SOLN
0.0000 [IU] | Freq: Three times a day (TID) | SUBCUTANEOUS | Status: DC
  Administered 2013-05-19: 13:00:00 3 [IU] via SUBCUTANEOUS
  Administered 2013-05-19: 18:00:00 7 [IU] via SUBCUTANEOUS
  Administered 2013-05-20: 5 [IU] via SUBCUTANEOUS
  Administered 2013-05-20: 13:00:00 7 [IU] via SUBCUTANEOUS
  Administered 2013-05-20: 2 [IU] via SUBCUTANEOUS
  Administered 2013-05-21: 09:00:00 1 [IU] via SUBCUTANEOUS
  Administered 2013-05-22: 17:00:00 3 [IU] via SUBCUTANEOUS
  Administered 2013-05-22: 12:00:00 7 [IU] via SUBCUTANEOUS
  Administered 2013-05-22: 9 [IU] via SUBCUTANEOUS
  Administered 2013-05-23: 3 [IU] via SUBCUTANEOUS
  Administered 2013-05-23: 2 [IU] via SUBCUTANEOUS
  Administered 2013-05-24: 13:00:00 3 [IU] via SUBCUTANEOUS
  Administered 2013-05-24: 09:00:00 5 [IU] via SUBCUTANEOUS

## 2013-05-19 MED ORDER — VANCOMYCIN HCL IN DEXTROSE 750-5 MG/150ML-% IV SOLN
750.0000 mg | INTRAVENOUS | Status: DC
  Administered 2013-05-20: 750 mg via INTRAVENOUS
  Filled 2013-05-19: qty 150

## 2013-05-19 MED ORDER — GLUCERNA SHAKE PO LIQD
237.0000 mL | Freq: Two times a day (BID) | ORAL | Status: DC
  Administered 2013-05-19 – 2013-05-24 (×7): 237 mL via ORAL

## 2013-05-19 MED ORDER — INSULIN DETEMIR 100 UNIT/ML ~~LOC~~ SOLN: 10.0000 [IU] | Freq: Every day | SUBCUTANEOUS | Status: DC

## 2013-05-19 MED ORDER — INSULIN GLARGINE 100 UNIT/ML ~~LOC~~ SOLN
10.0000 [IU] | Freq: Every day | SUBCUTANEOUS | Status: DC
  Administered 2013-05-19 – 2013-05-24 (×6): 10 [IU] via SUBCUTANEOUS
  Filled 2013-05-19 (×6): qty 0.1

## 2013-05-19 NOTE — Progress Notes (Signed)
Patient's b/p 88/59 manual PA notified orders received. Will continue to monitor.

## 2013-05-19 NOTE — Progress Notes (Addendum)
Pt had an elevated temp of 100.8 earlier and was given norco for pain and temp. Pt's temp was checked again and moved up to 102. Removed pt's house coat from pt, changed temp in room, and  Paged md on call. Awaiting further orders. MD paged back. Pt to get a one time dose of ibuprofen 800 mg. rn will continue to monitor pt.

## 2013-05-19 NOTE — Progress Notes (Signed)
Inpatient Diabetes Program Recommendations  AACE/ADA: New Consensus Statement on Inpatient Glycemic Control (2013)  Target Ranges:  Prepandial:   less than 140 mg/dL      Peak postprandial:   less than 180 mg/dL (1-2 hours)      Critically ill patients:  140 - 180 mg/dL     Results for [name], [name] (MRN [id number]) as of 05/19/2013 13:55  Ref. Range 05/17/2013 23:42 05/18/2013 02:43 05/18/2013 04:10 05/18/2013 08:28 05/18/2013 13:34 05/18/2013 15:12 05/18/2013 20:40  Glucose-Capillary Latest Range: 70-99 mg/dL 269 (H) 313 (H) 307 (H) 174 (H) 83 119 (H) 233 (H)    Results for [name], [name] (MRN [id number]) as of 05/19/2013 13:55  Ref. Range 05/18/2013 23:47 05/19/2013 03:41 05/19/2013 08:07 05/19/2013 12:48  Glucose-Capillary Latest Range: 70-99 mg/dL 240 (H) 257 (H) 161 (H) 229 (H)    **Patient admitted for cellulitis.  Was NPO yesterday.  Now on Carbohydrate Modified diet.    **Per records, patient takes the following insulin at home: Lantus 45 units QHS Novolog SSI tid  **Have asked RNs to please review basic DM concepts with patient and to have patient watch the DM videos on the patient education network.  Patient needs a PCP to follow up with for all her chronic illnesses.  Noted care management consult placed for this.  DM Coordinator spoke with patient back in July of this year about her DM control at home.   **MD- Please consider the following in-hospital insulin recommendations to help improve CBGs:\  1. Increase Lantus to 1/2 home dose- Lantus 20 units daily in the AM (currently getting Lantus 10 units daily) 2. Add Novolog meal coverage- Novolog 4 units tid with meals    Will follow and assist as needed. Wyn Quaker RN, MSN, CDE Diabetes Coordinator Inpatient Diabetes Program Team Pager: [phone] (8a-10p)

## 2013-05-19 NOTE — Progress Notes (Signed)
[facility] Progress Note    [name] [id number] DOB: 05/09/1966 DOA: 05/17/2013 PCP: Leeroy Cha  Brief narrative: 47 y/o female with DM, Hep C cirrhosis, Bipolar disorder, Neuropathy, h/o crack/cocaine use in the past who recently moved here from [place]. She states she was suing a physician for malpractice and moved up her because her family would support her. However, she has not had a specific place to stay since coming up here.  She has been seeing a Dr at [facility] for her medical issues but tells me she no longer has transport to get there.    Had a nail thru her foot two weeks ago, but did not feel it.  Now with Sepsis secondary to foot wound.    Assessment/Plan: Principal Problem:  Sepsis secondary to Cellulitis and abscess of left foot - Fever 102, tachycardia, elevated WBC - Blood cx show NGTD - Wound culture (gm+ Cocci in pairs) - h/o MRSA- cont Vanc and Cefepime - Appreciate Dr. Jess Barters consultation.  Patient to go to the OR on Friday 10/17. - MRI pending.    DM type 2 (diabetes mellitus, type 2) - last A1c in 7/14 was 9.2 - likely non-compliant with insulin given A1C - currently on lantus and SSI - diabetic Coordinator for education.  AKI -Secondary to infection -IVF -Hold lisinopril  Hyponatremia -check urine sodium/ osm/ creatinine- note pt is on NS  - calc FENA  HTN (hypertension), benign - added Coreg- she was on this in the past but it was stopped on the last admission due to hypotension - Lisinopril being held due to elevated creatinine.  Seizure disorder -Stable. -cont Keppra  Hypokalemia - replaced  History of MRSA abscesses - has been treated on and off with Doxy- last prescribed on 9/19 by [facility]- pt did not f/u after this appt  Hep C w/o coma, chronic -LFTs stable -No encephalopathy -No volume overload.  Low albumin (1.1) -total protein 5.3 - 6.9  -Nutrition  consultation. -Discuss with attending to determine if UPEP, SPEP are needed.   Code Status: full code Family Communication: none Disposition Plan: transfer to med/surg  Consultants: Dr. Sharol Given  Procedures: OR planned for 10/17  Antibiotics: VAnc/ Cefepime- 10/13  DVT prophylaxis: Heparin  HPI/Subjective: No complaints.  Understands that she is going to the OR on Friday for an amputation of the foot.  Reports that she plans to live in [place] for the next several years and would like to have a PCP.   Objective: Blood pressure 106/71, pulse 93, temperature 98.7 F (37.1 C), temperature source Oral, resp. rate 18, height 5\' 4"  (1.626 m), weight 78.3 kg (172 lb 9.9 oz), SpO2 97.00%.  Intake/Output Summary (Last 24 hours) at 05/19/13 1209 Last data filed at 05/19/13 0111  Gross per 24 hour  Intake   1120 ml  Output      0 ml  Net   1120 ml     Exam: General: A&O x 3, animated, NAD Lungs: Clear to auscultation bilaterally without wheezes or crackles Cardiovascular: Regular rate and rhythm without murmur gallop or rub normal S1 and S2 Abdomen: Nontender, nondistended, soft, bowel sounds positive, no rebound, no ascites, no appreciable mass Extremities:  edema left lower extremity with 1" annular wound on both dorsal and plantar portions of her foot as though it is thru and thru. Skin:  Multiple annular scars noted on lower extremities (healed abscesses?)   Data Reviewed: Basic Metabolic Panel:  Recent Labs Lab 05/17/13  2003 05/17/13 2015 05/18/13 0055 05/18/13 0545 05/19/13 0500  NA 124* 127* 129* 128* 125*  K 2.9* 2.9* 3.9 4.0 3.4*  CL 91* 93* 97 98 98  CO2 20  --  20 21 19   GLUCOSE 348* 348* 337* 227* 196*  BUN 11 10 10 11 18   CREATININE 1.54* 1.80* 1.25* 1.52* 2.06*  CALCIUM 7.5*  --  6.7* 6.9* 6.8*   Liver Function Tests:  Recent Labs Lab 05/17/13 2003 05/19/13 0500  AST 23 16  ALT 20 13  ALKPHOS 112 82  BILITOT 0.5 0.2*  PROT 6.9 5.3*  ALBUMIN 1.6*  1.1*   CBC:  Recent Labs Lab 05/17/13 2003 05/17/13 2015 05/18/13 0545 05/19/13 0500  WBC 19.6*  --  19.3* 14.5*  NEUTROABS 16.3*  --   --   --   HGB 11.1* 12.2 8.9* 7.8*  HCT 31.2* 36.0 25.1* 22.3*  MCV 80.8  --  81.0 81.4  PLT 324  --  301 261   CBG:  Recent Labs Lab 05/18/13 1512 05/18/13 2040 05/18/13 2347 05/19/13 0341 05/19/13 0807  GLUCAP 119* 233* 240* 257* 161*    Recent Results (from the past 240 hour(s))  WOUND CULTURE     Status: None   Collection Time    05/17/13  9:35 PM      Result Value Range Status   Specimen Description WOUND LEFT FOOT   Final   Special Requests NONE   Final   Gram Stain     Final   Value: NO WBC SEEN     NO SQUAMOUS EPITHELIAL CELLS SEEN     MODERATE GRAM POSITIVE COCCI IN PAIRS     Performed at [facility]   Culture     Final   Value: Culture reincubated for better growth     Performed at [facility]   Report Status PENDING   Incomplete  CULTURE, BLOOD (ROUTINE X 2)     Status: None   Collection Time    05/17/13  9:55 PM      Result Value Range Status   Specimen Description BLOOD LEFT HAND   Final   Special Requests BOTTLES DRAWN AEROBIC ONLY 10ML   Final   Culture  Setup Time     Final   Value: 05/18/2013 07:40     Performed at [facility]   Culture     Final   Value:        BLOOD CULTURE RECEIVED NO GROWTH TO DATE CULTURE WILL BE HELD FOR 5 DAYS BEFORE ISSUING A FINAL NEGATIVE REPORT     Performed at [facility]   Report Status PENDING   Incomplete  CULTURE, BLOOD (ROUTINE X 2)     Status: None   Collection Time    05/17/13  9:55 PM      Result Value Range Status   Specimen Description BLOOD RIGHT ARM   Final   Special Requests BOTTLES DRAWN AEROBIC ONLY 10ML   Final   Culture  Setup Time     Final   Value: 05/18/2013 07:40     Performed at [facility]   Culture     Final   Value:        BLOOD CULTURE RECEIVED NO GROWTH TO DATE CULTURE WILL BE HELD FOR 5 DAYS BEFORE  ISSUING A FINAL NEGATIVE REPORT     Performed at [facility]   Report Status PENDING   Incomplete  MRSA PCR SCREENING  Status: None   Collection Time    05/18/13 12:55 AM      Result Value Range Status   MRSA by PCR NEGATIVE  NEGATIVE Final   Comment:            The GeneXpert MRSA Assay (FDA     approved for NASAL specimens     only), is one component of a     comprehensive MRSA colonization     surveillance program. It is not     intended to diagnose MRSA     infection nor to guide or     monitor treatment for     MRSA infections.     Studies:  Recent x-ray studies have been reviewed in detail by the Attending Physician  Scheduled Meds:  Scheduled Meds: . carvedilol  6.25 mg Oral BID WC  . ceFEPime (MAXIPIME) IV  2 g Intravenous Q24H  . DULoxetine  60 mg Oral Daily  . heparin  5,000 Units Subcutaneous Q8H  . insulin aspart  0-9 Units Subcutaneous TID WC  . insulin glargine  10 Units Subcutaneous Daily  . levETIRAcetam  1,000 mg Oral QHS  . senna-docusate  1 tablet Oral QHS  . sodium chloride  10-40 mL Intracatheter Q12H  . sodium chloride  3 mL Intravenous Q12H  . TDaP  0.5 mL Intramuscular Once  . [START ON 05/20/2013] vancomycin  750 mg Intravenous Q24H   Continuous Infusions: . sodium chloride 75 mL/hr at 05/19/13 0854     Imogene Burn, PA-C [facility] Pager: [phone]   If 7PM-7AM, please contact night-coverage [email or web address] Password [email or web address] 05/19/2013, 12:09 PM   LOS: 2 days

## 2013-05-19 NOTE — Consult Note (Signed)
Reason for Consult: Abscess left foot Referring Physician: [name] is an 47 y.o. female.  HPI: Patient is a 47 year old woman with diabetic insensate neuropathy. She states that she was walking around for 2 weeks with a nail sticking through the bottom of her shoe. Noticed ulceration first dorsal forefoot did not notice the nail plantarly with the ulcer.  Past Medical History  Diagnosis Date  . Diabetes mellitus   . Hepatitis C virus   . Neuropathy   . Glaucoma   . Bipolar 1 disorder     Past Surgical History  Procedure Laterality Date  . Abdominal hysterectomy      No family history on file.  Social History:  reports that she has quit smoking. She has never used smokeless tobacco. She reports that she does not drink alcohol or use illicit drugs.  Allergies:  Allergies  Allergen Reactions  . Gabapentin Itching and Swelling  . Penicillins Hives    Medications: I have reviewed the patient's current medications.  Results for orders placed during the hospital encounter of 05/17/13 (from the past 48 hour(s))  CBC WITH DIFFERENTIAL     Status: Abnormal   Collection Time    05/17/13  8:03 PM      Result Value Range   WBC 19.6 (*) 4.0 - 10.5 K/uL   RBC 3.86 (*) 3.87 - 5.11 MIL/uL   Hemoglobin 11.1 (*) 12.0 - 15.0 g/dL   HCT 31.2 (*) 36.0 - 46.0 %   MCV 80.8  78.0 - 100.0 fL   MCH 28.8  26.0 - 34.0 pg   MCHC 35.6  30.0 - 36.0 g/dL   RDW 13.1  11.5 - 15.5 %   Platelets 324  150 - 400 K/uL   Neutrophils Relative % 83 (*) 43 - 77 %   Neutro Abs 16.3 (*) 1.7 - 7.7 K/uL   Lymphocytes Relative 6 (*) 12 - 46 %   Lymphs Abs 1.1  0.7 - 4.0 K/uL   Monocytes Relative 11  3 - 12 %   Monocytes Absolute 2.1 (*) 0.1 - 1.0 K/uL   Eosinophils Relative 0  0 - 5 %   Eosinophils Absolute 0.0  0.0 - 0.7 K/uL   Basophils Relative 0  0 - 1 %   Basophils Absolute 0.0  0.0 - 0.1 K/uL  COMPREHENSIVE METABOLIC PANEL     Status: Abnormal   Collection Time    05/17/13  8:03 PM   Result Value Range   Sodium 124 (*) 135 - 145 mEq/L   Potassium 2.9 (*) 3.5 - 5.1 mEq/L   Chloride 91 (*) 96 - 112 mEq/L   CO2 20  19 - 32 mEq/L   Glucose, Bld 348 (*) 70 - 99 mg/dL   BUN 11  6 - 23 mg/dL   Creatinine, Ser 1.54 (*) 0.50 - 1.10 mg/dL   Calcium 7.5 (*) 8.4 - 10.5 mg/dL   Total Protein 6.9  6.0 - 8.3 g/dL   Albumin 1.6 (*) 3.5 - 5.2 g/dL   AST 23  0 - 37 U/L   ALT 20  0 - 35 U/L   Alkaline Phosphatase 112  39 - 117 U/L   Total Bilirubin 0.5  0.3 - 1.2 mg/dL   GFR calc non Af Amer 39 (*) >90 mL/min   GFR calc Af Amer 46 (*) >90 mL/min   Comment: (NOTE)     The eGFR has been calculated using the CKD EPI equation.  This calculation has not been validated in all clinical situations.     eGFR's persistently <90 mL/min signify possible Chronic Kidney     Disease.  GLUCOSE, CAPILLARY     Status: Abnormal   Collection Time    05/17/13  8:06 PM      Result Value Range   Glucose-Capillary 326 (*) 70 - 99 mg/dL  POCT I-STAT TROPONIN I     Status: None   Collection Time    05/17/13  8:13 PM      Result Value Range   Troponin i, poc 0.00  0.00 - 0.08 ng/mL   Comment 3            Comment: Due to the release kinetics of cTnI,     a negative result within the first hours     of the onset of symptoms does not rule out     myocardial infarction with certainty.     If myocardial infarction is still suspected,     repeat the test at appropriate intervals.  POCT I-STAT, CHEM 8     Status: Abnormal   Collection Time    05/17/13  8:15 PM      Result Value Range   Sodium 127 (*) 135 - 145 mEq/L   Potassium 2.9 (*) 3.5 - 5.1 mEq/L   Chloride 93 (*) 96 - 112 mEq/L   BUN 10  6 - 23 mg/dL   Creatinine, Ser 1.80 (*) 0.50 - 1.10 mg/dL   Glucose, Bld 348 (*) 70 - 99 mg/dL   Calcium, Ion 1.04 (*) 1.12 - 1.23 mmol/L   TCO2 20  0 - 100 mmol/L   Hemoglobin 12.2  12.0 - 15.0 g/dL   HCT 36.0  36.0 - 46.0 %  CG4 I-STAT (LACTIC ACID)     Status: None   Collection Time    05/17/13   8:28 PM      Result Value Range   Lactic Acid, Venous 1.62  0.5 - 2.2 mmol/L  WOUND CULTURE     Status: None   Collection Time    05/17/13  9:35 PM      Result Value Range   Specimen Description WOUND LEFT FOOT     Special Requests NONE     Gram Stain       Value: NO WBC SEEN     NO SQUAMOUS EPITHELIAL CELLS SEEN     MODERATE GRAM POSITIVE COCCI IN PAIRS     Performed at [facility]   Culture       Value: Culture reincubated for better growth     Performed at [facility]   Report Status PENDING    GLUCOSE, CAPILLARY     Status: Abnormal   Collection Time    05/17/13 11:42 PM      Result Value Range   Glucose-Capillary 269 (*) 70 - 99 mg/dL  BASIC METABOLIC PANEL     Status: Abnormal   Collection Time    05/18/13 12:55 AM      Result Value Range   Sodium 129 (*) 135 - 145 mEq/L   Potassium 3.9  3.5 - 5.1 mEq/L   Comment: DELTA CHECK NOTED   Chloride 97  96 - 112 mEq/L   CO2 20  19 - 32 mEq/L   Glucose, Bld 337 (*) 70 - 99 mg/dL   BUN 10  6 - 23 mg/dL   Creatinine, Ser 1.25 (*) 0.50 - 1.10 mg/dL   Calcium  6.7 (*) 8.4 - 10.5 mg/dL   GFR calc non Af Amer 51 (*) >90 mL/min   GFR calc Af Amer 59 (*) >90 mL/min   Comment: (NOTE)     The eGFR has been calculated using the CKD EPI equation.     This calculation has not been validated in all clinical situations.     eGFR's persistently <90 mL/min signify possible Chronic Kidney     Disease.  MRSA PCR SCREENING     Status: None   Collection Time    05/18/13 12:55 AM      Result Value Range   MRSA by PCR NEGATIVE  NEGATIVE   Comment:            The GeneXpert MRSA Assay (FDA     approved for NASAL specimens     only), is one component of a     comprehensive MRSA colonization     surveillance program. It is not     intended to diagnose MRSA     infection nor to guide or     monitor treatment for     MRSA infections.  GLUCOSE, CAPILLARY     Status: Abnormal   Collection Time    05/18/13  2:43 AM       Result Value Range   Glucose-Capillary 313 (*) 70 - 99 mg/dL  GLUCOSE, CAPILLARY     Status: Abnormal   Collection Time    05/18/13  4:10 AM      Result Value Range   Glucose-Capillary 307 (*) 70 - 99 mg/dL  CBC     Status: Abnormal   Collection Time    05/18/13  5:45 AM      Result Value Range   WBC 19.3 (*) 4.0 - 10.5 K/uL   RBC 3.10 (*) 3.87 - 5.11 MIL/uL   Hemoglobin 8.9 (*) 12.0 - 15.0 g/dL   Comment: REPEATED TO VERIFY     DELTA CHECK NOTED   HCT 25.1 (*) 36.0 - 46.0 %   MCV 81.0  78.0 - 100.0 fL   MCH 28.7  26.0 - 34.0 pg   MCHC 35.5  30.0 - 36.0 g/dL   RDW 12.9  11.5 - 15.5 %   Platelets 301  150 - 400 K/uL  BASIC METABOLIC PANEL     Status: Abnormal   Collection Time    05/18/13  5:45 AM      Result Value Range   Sodium 128 (*) 135 - 145 mEq/L   Potassium 4.0  3.5 - 5.1 mEq/L   Chloride 98  96 - 112 mEq/L   CO2 21  19 - 32 mEq/L   Glucose, Bld 227 (*) 70 - 99 mg/dL   BUN 11  6 - 23 mg/dL   Creatinine, Ser 1.52 (*) 0.50 - 1.10 mg/dL   Calcium 6.9 (*) 8.4 - 10.5 mg/dL   GFR calc non Af Amer 40 (*) >90 mL/min   GFR calc Af Amer 46 (*) >90 mL/min   Comment: (NOTE)     The eGFR has been calculated using the CKD EPI equation.     This calculation has not been validated in all clinical situations.     eGFR's persistently <90 mL/min signify possible Chronic Kidney     Disease.  GLUCOSE, CAPILLARY     Status: Abnormal   Collection Time    05/18/13  8:28 AM      Result Value Range   Glucose-Capillary 174 (*) 70 - 99  mg/dL   Comment 1 Documented in Chart     Comment 2 Notify RN    GLUCOSE, CAPILLARY     Status: None   Collection Time    05/18/13  1:34 PM      Result Value Range   Glucose-Capillary 83  70 - 99 mg/dL  GLUCOSE, CAPILLARY     Status: Abnormal   Collection Time    05/18/13  3:12 PM      Result Value Range   Glucose-Capillary 119 (*) 70 - 99 mg/dL   Comment 1 Documented in Chart     Comment 2 Notify RN    GLUCOSE, CAPILLARY     Status: Abnormal    Collection Time    05/18/13  8:40 PM      Result Value Range   Glucose-Capillary 233 (*) 70 - 99 mg/dL   Comment 1 Notify RN     Comment 2 Documented in Chart    GLUCOSE, CAPILLARY     Status: Abnormal   Collection Time    05/18/13 11:47 PM      Result Value Range   Glucose-Capillary 240 (*) 70 - 99 mg/dL   Comment 1 Notify RN     Comment 2 Documented in Chart    GLUCOSE, CAPILLARY     Status: Abnormal   Collection Time    05/19/13  3:41 AM      Result Value Range   Glucose-Capillary 257 (*) 70 - 99 mg/dL   Comment 1 Notify RN     Comment 2 Documented in Chart      Dg Chest 2 View  05/17/2013   CLINICAL DATA:  hypertension and dyspnea on exertion  EXAM: CHEST  2 VIEW  COMPARISON:  February 09, 2013  FINDINGS: The lungs are clear. Heart size and pulmonary vascularity are normal. No adenopathy. No bone lesions. There are small cervical ribs bilaterally.  IMPRESSION: No edema or consolidation. Small cervical ribs bilaterally.   Electronically Signed   By: Lowella Grip M.D.   On: 05/17/2013 21:23   Dg Foot Complete Left  05/17/2013   CLINICAL DATA:  Left foot pain and swelling. Plantar ulcer. Diabetes.  EXAM: LEFT FOOT - COMPLETE 3+ VIEW  COMPARISON:  None.  FINDINGS: There is no evidence of fracture or dislocation. There is no evidence of arthropathy or other focal bone abnormality. No evidence of periostitis or osteolysis. No evidence of soft tissue gas. Small plantar calcaneal bone spur noted as well as mild peripheral vascular calcification.  IMPRESSION: No acute findings.  Small plantar calcaneal bone spur and mild peripheral vascular calcification.   Electronically Signed   By: Earle Gell M.D.   On: 05/17/2013 21:23    Review of Systems  All other systems reviewed and are negative.   Blood pressure 106/71, pulse 93, temperature 98.7 F (37.1 C), temperature source Oral, resp. rate 18, height 5\' 4"  (1.626 m), weight 78.3 kg (172 lb 9.9 oz), SpO2 97.00%. Physical Exam On  examination patient has palpable dorsalis pedis pulse she has a large ulcer of the midfoot both plantarly and dorsally be between the third and fourth rays. Patient's radiographs shows no bony abnormalities. The MRI scan is pending. Assessment/Plan: Assessment: Large abscess left foot midfoot.  Plan: We'll plan for surgery on Friday. Patient may be a candidate for a midfoot amputation but depending on the results of the MRI scan if the abscess is very extensive she may require a transtibial amputation. We will reevaluate after the  MRI scan is obtained plan for surgery Friday afternoon.  [name] 05/19/2013, 7:22 AM

## 2013-05-19 NOTE — Progress Notes (Signed)
Addendum  Patient seen and examined, chart and data base reviewed.  I agree with the above assessment and plan.  For full details please see Mrs. Imogene Burn PA note.  Sepsis secondary to left foot cellulitis/abscess. To the OR on Friday.   Birdie Hopes, MD [facility] Pager: [phone] 05/19/2013, 12:22 PM

## 2013-05-19 NOTE — Progress Notes (Signed)
INITIAL NUTRITION ASSESSMENT  DOCUMENTATION CODES Per approved criteria  -Not Applicable   INTERVENTION: Glucerna Shake po BID, each supplement provides 220 kcal and 10 grams of protein. Add MVI daily. RD provided "Carbohydrate Counting" information during this visit. RD to continue to follow nutrition care plan.  NUTRITION DIAGNOSIS: Increased nutrient needs related to wound healing as evidenced by estimated needs.   Goal: Intake to meet >90% of estimated nutrition needs. Basic understanding of carbohydrate counting.  Monitor:  weight trends, lab trends, I/O's, PO intake, supplement tolerance, need for further diet education  Reason for Assessment: MD Consult for Assessment  47 y.o. female  Admitting Dx: Cellulitis and abscess of foot  ASSESSMENT: PMHx significant for DM, Hep C, cirrhosis, bipolar disorder, neuropathy, drug use. Admitted after stepping on a nail approx 2 weeks ago. Work-up reveals cellulitis and abscess of foot.  Pt reports that she does not have transportation or a stable place to live. Ortho has evaluated patient and is recommending either a midfoot or transtibial amputation.  Pt is unable to state if or how often she has access to food at baseline. We discussed Glucerna Shakes, she states that she used to get them in the mail, but she doesn't any longer because they discontinued the program. She enjoys these supplements.  Pt is at risk for malnutrition given her chronic medical issues and current social situation. Agreeable to Glucerna Shakes while present. Discussed and reviewed sources of protein - encouraged intake of each at meal.  Reviewed Carbohydrate Counting information with patient.  Height: Ht Readings from Last 1 Encounters:  05/17/13 5\' 4"  (1.626 m)    Weight: Wt Readings from Last 1 Encounters:  05/17/13 172 lb 9.9 oz (78.3 kg)    Ideal Body Weight: 120 lb  % Ideal Body Weight: 143%  Wt Readings from Last 10 Encounters:  05/17/13  172 lb 9.9 oz (78.3 kg)  02/09/13 161 lb (73.029 kg)  07/12/11 210 lb (95.255 kg)    Usual Body Weight: n/a  % Usual Body Weight: n/a  BMI:  Body mass index is 29.62 kg/(m^2). Overweight  Estimated Nutritional Needs: Kcal: 1775 - 1900 Protein: 78 - 90 g Fluid: 1.7 - 1.9 liters daily  Skin: L foot ulcer  Diet Order: Carb Control Medium  EDUCATION NEEDS: -Education needs addressed   Intake/Output Summary (Last 24 hours) at 05/19/13 1427 Last data filed at 05/19/13 0111  Gross per 24 hour  Intake    995 ml  Output      0 ml  Net    995 ml    Last BM: 10/12  Labs:   Recent Labs Lab 05/18/13 0055 05/18/13 0545 05/19/13 0500  NA 129* 128* 125*  K 3.9 4.0 3.4*  CL 97 98 98  CO2 20 21 19   BUN 10 11 18   CREATININE 1.25* 1.52* 2.06*  CALCIUM 6.7* 6.9* 6.8*  GLUCOSE 337* 227* 196*    CBG (last 3)   Recent Labs  05/19/13 0341 05/19/13 0807 05/19/13 1248  GLUCAP 257* 161* 229*    Scheduled Meds: . carvedilol  6.25 mg Oral BID WC  . ceFEPime (MAXIPIME) IV  2 g Intravenous Q24H  . DULoxetine  60 mg Oral Daily  . heparin  5,000 Units Subcutaneous Q8H  . insulin aspart  0-9 Units Subcutaneous TID WC  . insulin glargine  10 Units Subcutaneous Daily  . levETIRAcetam  1,000 mg Oral QHS  . living well with diabetes book   Does not apply Once  .  senna-docusate  1 tablet Oral QHS  . sodium chloride  10-40 mL Intracatheter Q12H  . sodium chloride  3 mL Intravenous Q12H  . TDaP  0.5 mL Intramuscular Once  . [START ON 05/20/2013] vancomycin  750 mg Intravenous Q24H    Continuous Infusions: . sodium chloride 75 mL/hr at 05/19/13 0854    Past Medical History  Diagnosis Date  . Diabetes mellitus   . Hepatitis C virus   . Neuropathy   . Glaucoma   . Bipolar 1 disorder     Past Surgical History  Procedure Laterality Date  . Abdominal hysterectomy      Inda Coke MS, RD, LDN Pager: [phone] After-hours pager: [phone]

## 2013-05-19 NOTE — Progress Notes (Signed)
ANTIBIOTIC CONSULT NOTE - FOLLOW UP  Pharmacy Consult for vancomycin, cefepime Indication: left foot ulcer infection    Allergies  Allergen Reactions  . Gabapentin Itching and Swelling  . Penicillins Hives    Patient Measurements: Height: 5\' 4"  (162.6 cm) Weight: 172 lb 9.9 oz (78.3 kg) IBW/kg (Calculated) : 54.7  Vital Signs: Temp: 98.7 F (37.1 C) (10/15 0529) Temp src: Oral (10/15 0529) BP: 106/71 mmHg (10/15 0529) Pulse Rate: 93 (10/15 0529) Intake/Output from previous day: 10/14 0701 - 10/15 0700 In: 1120 [P.O.:720; I.V.:400] Out: -  Intake/Output from this shift:    Labs:  Recent Labs  05/17/13 2003 05/17/13 2015 05/18/13 0055 05/18/13 0545 05/19/13 0500  WBC 19.6*  --   --  19.3* 14.5*  HGB 11.1* 12.2  --  8.9* 7.8*  PLT 324  --   --  301 261  CREATININE 1.54* 1.80* 1.25* 1.52* 2.06*   Estimated Creatinine Clearance: 34.5 ml/min (by C-G formula based on Cr of 2.06). No results found for this basename: VANCOTROUGH, VANCOPEAK, VANCORANDOM, GENTTROUGH, GENTPEAK, GENTRANDOM, TOBRATROUGH, TOBRAPEAK, TOBRARND, AMIKACINPEAK, AMIKACINTROU, AMIKACIN,  in the last 72 hours    Assessment: 47 yo female with cellulitis and abscess of the left foot on vancomycin and cefepime. WBC= 14.5, tmax (24hr)= 102, SCr= 2.06 (increasing trend) and CrCl ~ 35.   Goal of Therapy:  Vancomycin trough level 10-15 mcg/ml  Plan:  -Due to change in renal function will change vancomycin to 750mg  IV q24hr -No cefepime changes needed -Will follow SCr trend  Hildred Laser, Pharm D 05/19/2013 8:47 AM   e

## 2013-05-20 ENCOUNTER — Inpatient Hospital Stay (HOSPITAL_COMMUNITY): Payer: Medicare Other

## 2013-05-20 LAB — COMPREHENSIVE METABOLIC PANEL
AST: 17 U/L (ref 0–37)
Albumin: 1.2 g/dL — ABNORMAL LOW (ref 3.5–5.2)
BUN: 25 mg/dL — ABNORMAL HIGH (ref 6–23)
Calcium: 7.1 mg/dL — ABNORMAL LOW (ref 8.4–10.5)
Chloride: 98 mEq/L (ref 96–112)
Creatinine, Ser: 2.34 mg/dL — ABNORMAL HIGH (ref 0.50–1.10)
GFR calc Af Amer: 28 mL/min — ABNORMAL LOW (ref >90)
Glucose, Bld: 337 mg/dL — ABNORMAL HIGH (ref 70–99)
Potassium: 3.7 mEq/L (ref 3.5–5.1)
Sodium: 127 mEq/L — ABNORMAL LOW (ref 135–145)
Total Bilirubin: 0.2 mg/dL — ABNORMAL LOW (ref 0.3–1.2)
Total Protein: 5.8 g/dL — ABNORMAL LOW (ref 6.0–8.3)

## 2013-05-20 LAB — GLUCOSE, CAPILLARY: Glucose-Capillary: 334 mg/dL — ABNORMAL HIGH (ref 70–99)

## 2013-05-20 LAB — CBC
Hemoglobin: 8 g/dL — ABNORMAL LOW (ref 12.0–15.0)
RBC: 2.79 MIL/uL — ABNORMAL LOW (ref 3.87–5.11)
RDW: 13.5 % (ref 11.5–15.5)

## 2013-05-20 LAB — WOUND CULTURE: Gram Stain: NONE SEEN

## 2013-05-20 MED ORDER — ONDANSETRON HCL 4 MG/2ML IJ SOLN
4.0000 mg | Freq: Four times a day (QID) | INTRAMUSCULAR | Status: DC | PRN
  Administered 2013-05-21 – 2013-05-22 (×2): 4 mg via INTRAVENOUS
  Filled 2013-05-20 (×2): qty 2

## 2013-05-20 MED ORDER — MORPHINE SULFATE 2 MG/ML IJ SOLN
1.0000 mg | INTRAMUSCULAR | Status: DC | PRN
  Administered 2013-05-20 – 2013-05-22 (×10): 2 mg via INTRAVENOUS
  Filled 2013-05-20 (×10): qty 1

## 2013-05-20 NOTE — Progress Notes (Signed)
[facility] Progress Note    [name] [id number] DOB: [birth year] DOA: 05/17/2013 PCP: Leeroy Cha  HPI/Subjective: Pain is uncontrolled with oral medications, also has some nausea after the oral narcotics.  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 She states she was suing a physician for malpractice and moved up her because her family would support her. However, she has not had a specific place to stay since coming up here.  She has been seeing a Dr at [facility] for her medical issues but tells me she no longer has transport to get there.    Had a nail thru her foot two weeks ago, but did not feel it.  Now with Sepsis secondary to foot wound.    Assessment/Plan: Principal Problem:  Sepsis secondary to Cellulitis and abscess of left foot - Fever 102, tachycardia, elevated WBC - Blood cx show NGTD - Wound culture (gm+ Cocci in pairs) - h/o MRSA- cont Vanc and Cefepime - Appreciate Dr. Jess Barters consultation.  Patient to go to the OR on Friday 10/17. For mid foot amputation.  DM type 2 (diabetes mellitus, type 2) - last A1c in 7/14 was 9.2 - likely non-compliant with insulin given A1C - currently on lantus and SSI - diabetic Coordinator for education.  AKI -Secondary to infection, on IV fluids. Lisinopril held. -Discussed with the patient, patient mentioned that she she was about to be on dialysis about 5 years ago but her kidney function improved. -Follow closely.  Hyponatremia -Patient does have history of cirrhosis, cannot rule out chronic liver disease. - patient is on IV fluids, started to develop anasarca with third spacing secondary to low albumin.  HTN (hypertension), benign - added Coreg- she was on this in the past but it was stopped on the last admission due to hypotension - Lisinopril being held due to elevated  creatinine.  Seizure disorder -Stable. -cont Keppra  Hypokalemia - replaced  History of MRSA abscesses - has been treated on and off with Doxy- last prescribed on 9/19 by [facility]- pt did not f/u after this appt  Hep C w/o coma, chronic -LFTs stable -No encephalopathy -No volume overload.  Low albumin (1.1) -total protein 5.3 - 6.9  -Nutrition consultation. -This is likely secondary to chronic liver disease   Code Status: full code Family Communication: none Disposition Plan: transfer to med/surg  Consultants: Dr. Sharol Given  Procedures: OR planned for 10/17  Antibiotics: VAnc/ Cefepime- 10/13  DVT prophylaxis: Heparin     Objective: Blood pressure 127/85, pulse 89, temperature 97.7 F (36.5 C), temperature source Oral, resp. rate 18, height 5\' 4"  (1.626 m), weight 78.3 kg (172 lb 9.9 oz), SpO2 100.00%.  Intake/Output Summary (Last 24 hours) at 05/20/13 1500 Last data filed at 05/20/13 1300  Gross per 24 hour  Intake 1917.5 ml  Output      0 ml  Net 1917.5 ml     Exam: General: A&O x 3, animated, NAD Lungs: Clear to auscultation bilaterally without wheezes or crackles Cardiovascular: Regular rate and rhythm without murmur gallop or rub normal S1 and S2 Abdomen: Nontender, nondistended, soft, bowel sounds positive, no rebound, no ascites, no appreciable mass Extremities:  edema left lower extremity with 1" annular wound on both dorsal and plantar portions of her foot as though it is thru and thru. Skin:  Multiple annular scars noted on lower extremities (healed abscesses?)  Data Reviewed: Basic Metabolic Panel:  Recent Labs Lab 05/17/13 2003 05/17/13 2015 05/18/13 0055 05/18/13 0545 05/19/13 0500 05/20/13 0628  NA 124* 127* 129* 128* 125* 127*  K 2.9* 2.9* 3.9 4.0 3.4* 3.7  CL 91* 93* 97 98 98 98  CO2 20  --  20 21 19 20   GLUCOSE 348* 348* 337* 227* 196* 337*  BUN 11 10 10 11 18  25*  CREATININE 1.54* 1.80* 1.25* 1.52* 2.06* 2.34*   CALCIUM 7.5*  --  6.7* 6.9* 6.8* 7.1*   Liver Function Tests:  Recent Labs Lab 05/17/13 2003 05/19/13 0500 05/20/13 0628  AST 23 16 17   ALT 20 13 14   ALKPHOS 112 82 123*  BILITOT 0.5 0.2* 0.2*  PROT 6.9 5.3* 5.8*  ALBUMIN 1.6* 1.1* 1.2*   CBC:  Recent Labs Lab 05/17/13 2003 05/17/13 2015 05/18/13 0545 05/19/13 0500 05/20/13 0628  WBC 19.6*  --  19.3* 14.5* 12.2*  NEUTROABS 16.3*  --   --   --   --   HGB 11.1* 12.2 8.9* 7.8* 8.0*  HCT 31.2* 36.0 25.1* 22.3* 22.7*  MCV 80.8  --  81.0 81.4 81.4  PLT 324  --  301 261 262   CBG:  Recent Labs Lab 05/19/13 1729 05/19/13 2015 05/19/13 2347 05/20/13 0839 05/20/13 1243  GLUCAP 312* 254* 321* 289* 334*    Recent Results (from the past 240 hour(s))  WOUND CULTURE     Status: None   Collection Time    05/17/13  9:35 PM      Result Value Range Status   Specimen Description WOUND LEFT FOOT   Final   Special Requests NONE   Final   Gram Stain     Final   Value: NO WBC SEEN     NO SQUAMOUS EPITHELIAL CELLS SEEN     MODERATE GRAM POSITIVE COCCI IN PAIRS     Performed at [facility]   Culture     Final   Value: ABUNDANT GROUP B STREP(S.AGALACTIAE)ISOLATED     Note: TESTING AGAINST S. AGALACTIAE NOT ROUTINELY PERFORMED DUE TO PREDICTABILITY OF AMP/PEN/VAN SUSCEPTIBILITY.     Performed at [facility]   Report Status 05/20/2013 FINAL   Final  CULTURE, BLOOD (ROUTINE X 2)     Status: None   Collection Time    05/17/13  9:55 PM      Result Value Range Status   Specimen Description BLOOD LEFT HAND   Final   Special Requests BOTTLES DRAWN AEROBIC ONLY 10ML   Final   Culture  Setup Time     Final   Value: 05/18/2013 07:40     Performed at [facility]   Culture     Final   Value:        BLOOD CULTURE RECEIVED NO GROWTH TO DATE CULTURE WILL BE HELD FOR 5 DAYS BEFORE ISSUING A FINAL NEGATIVE REPORT     Performed at [facility]   Report Status PENDING   Incomplete  CULTURE, BLOOD  (ROUTINE X 2)     Status: None   Collection Time    05/17/13  9:55 PM      Result Value Range Status   Specimen Description BLOOD RIGHT ARM   Final   Special Requests BOTTLES DRAWN AEROBIC ONLY 10ML   Final   Culture  Setup Time     Final   Value: 05/18/2013 07:40     Performed at [facility]  Final   Value:        BLOOD CULTURE RECEIVED NO GROWTH TO DATE CULTURE WILL BE HELD FOR 5 DAYS BEFORE ISSUING A FINAL NEGATIVE REPORT     Performed at [facility]   Report Status PENDING   Incomplete  MRSA PCR SCREENING     Status: None   Collection Time    05/18/13 12:55 AM      Result Value Range Status   MRSA by PCR NEGATIVE  NEGATIVE Final   Comment:            The GeneXpert MRSA Assay (FDA     approved for NASAL specimens     only), is one component of a     comprehensive MRSA colonization     surveillance program. It is not     intended to diagnose MRSA     infection nor to guide or     monitor treatment for     MRSA infections.     Studies:  Recent x-ray studies have been reviewed in detail by the Attending Physician  Scheduled Meds:  Scheduled Meds: . carvedilol  6.25 mg Oral BID WC  . ceFEPime (MAXIPIME) IV  2 g Intravenous Q24H  . DULoxetine  60 mg Oral Daily  . feeding supplement (GLUCERNA SHAKE)  237 mL Oral BID BM  . heparin  5,000 Units Subcutaneous Q8H  . insulin aspart  0-9 Units Subcutaneous TID WC  . insulin glargine  10 Units Subcutaneous Daily  . levETIRAcetam  1,000 mg Oral QHS  . multivitamin with minerals  1 tablet Oral Daily  . senna-docusate  1 tablet Oral QHS  . sodium chloride  10-40 mL Intracatheter Q12H  . sodium chloride  3 mL Intravenous Q12H   Continuous Infusions: . sodium chloride 75 mL/hr at 05/20/13 [place] [facility] Pager: [phone]   If 7PM-7AM, please contact night-coverage [email or web address] Password [email or web address] 05/20/2013, 3:00 PM   LOS: 3 days

## 2013-05-20 NOTE — Progress Notes (Signed)
Pt had an episode of emesis following PO Coreg. Pill noted in emesis. Pt BP 120/70 and Pulse 90. Notified pharmacy for new dose to be sent up. Pt states relief of nausea.  Will continue to monitor pt.   Delman Cheadle

## 2013-05-20 NOTE — Progress Notes (Signed)
Patient ID: [name], female   DOB: Sep 29, 1965, 47 y.o.   MRN: [id number] MRI reviewed. The abscess does not appear to be proximal to the midfoot. Anticipate patient has a good chance of healing a midfoot amputation. Will plan for a midfoot amputation tomorrow afternoon. Risks and benefits were discussed including the risk of nonhealing and the risk of need for higher level amputation. The importance of strict nonweightbearing postoperatively was discussed to ensure wound healing.

## 2013-05-20 NOTE — Progress Notes (Signed)
Inpatient Diabetes Program Recommendations  AACE/ADA: New Consensus Statement on Inpatient Glycemic Control (2013)  Target Ranges:  Prepandial:   less than 140 mg/dL      Peak postprandial:   less than 180 mg/dL (1-2 hours)      Critically ill patients:  140 - 180 mg/dL     Results for [name], [name] (MRN [id number]) as of 05/20/2013 12:54  Ref. Range 05/19/2013 08:07 05/19/2013 12:48 05/19/2013 17:29 05/19/2013 20:15  Glucose-Capillary Latest Range: 70-99 mg/dL 161 (H) 229 (H) 312 (H) 254 (H)    Results for [name], [name] (MRN [id number]) as of 05/20/2013 12:54  Ref. Range 05/20/2013 08:39 05/20/2013 12:43  Glucose-Capillary Latest Range: 70-99 mg/dL 289 (H) 334 (H)     **Have asked RNs to please review basic DM concepts with patient and to have patient watch the DM videos on the patient education network. Patient needs a PCP to follow up with for all her chronic illnesses. Noted care management consult placed for this. DM Coordinator spoke with patient back in July of this year about her DM control at home.    **MD- Please consider the following in-hospital insulin recommendations to help improve CBGs:  1. Increase Lantus to 1/2 home dose- Lantus 20 units daily in the AM (currently getting Lantus 10 units daily)  2. Add Novolog meal coverage- Novolog 4 units tid with meals    Will follow. Wyn Quaker RN, MSN, CDE Diabetes Coordinator Inpatient Diabetes Program Team Pager: [phone] (8a-10p)

## 2013-05-21 ENCOUNTER — Encounter (HOSPITAL_COMMUNITY): Payer: Medicare Other | Admitting: Anesthesiology

## 2013-05-21 ENCOUNTER — Encounter (HOSPITAL_COMMUNITY): Payer: Self-pay | Admitting: Anesthesiology

## 2013-05-21 ENCOUNTER — Inpatient Hospital Stay (HOSPITAL_COMMUNITY): Payer: Medicare Other | Admitting: Anesthesiology

## 2013-05-21 ENCOUNTER — Encounter (HOSPITAL_COMMUNITY)
Admission: EM | Disposition: A | Discharge status: Home Health | Payer: Self-pay | Source: Home / Self Care | Attending: Internal Medicine

## 2013-05-21 DIAGNOSIS — E860 Dehydration: Secondary | ICD-10-CM

## 2013-05-21 DIAGNOSIS — Z531 Procedure and treatment not carried out because of patient's decision for reasons of belief and group pressure: Secondary | ICD-10-CM

## 2013-05-21 HISTORY — PX: AMPUTATION: SHX166

## 2013-05-21 LAB — CBC
HCT: 22.4 % — ABNORMAL LOW (ref 36.0–46.0)
MCHC: 34.8 g/dL (ref 30.0–36.0)
MCV: 80.6 fL (ref 78.0–100.0)
RBC: 2.78 MIL/uL — ABNORMAL LOW (ref 3.87–5.11)
RDW: 13.3 % (ref 11.5–15.5)
WBC: 11.7 10*3/uL — ABNORMAL HIGH (ref 4.0–10.5)

## 2013-05-21 LAB — GLUCOSE, CAPILLARY
Glucose-Capillary: 146 mg/dL — ABNORMAL HIGH (ref 70–99)
Glucose-Capillary: 138 mg/dL — ABNORMAL HIGH (ref 70–99)
Glucose-Capillary: 128 mg/dL — ABNORMAL HIGH (ref 70–99)
Glucose-Capillary: 196 mg/dL — ABNORMAL HIGH (ref 70–99)

## 2013-05-21 LAB — COMPREHENSIVE METABOLIC PANEL
ALT: 14 U/L (ref 0–35)
Albumin: 1.1 g/dL — ABNORMAL LOW (ref 3.5–5.2)
Alkaline Phosphatase: 122 U/L — ABNORMAL HIGH (ref 39–117)
BUN: 19 mg/dL (ref 6–23)
CO2: 22 mEq/L (ref 19–32)
Chloride: 101 mEq/L (ref 96–112)
Creatinine, Ser: 1.84 mg/dL — ABNORMAL HIGH (ref 0.50–1.10)
Glucose, Bld: 150 mg/dL — ABNORMAL HIGH (ref 70–99)
Sodium: 131 mEq/L — ABNORMAL LOW (ref 135–145)
Total Protein: 5.6 g/dL — ABNORMAL LOW (ref 6.0–8.3)

## 2013-05-21 LAB — TYPE AND SCREEN
ABO/RH(D): O POS
Antibody Screen: NEGATIVE

## 2013-05-21 SURGERY — AMPUTATION, FOOT, PARTIAL
Anesthesia: General | Site: Foot | Laterality: Left | Wound class: Dirty or Infected

## 2013-05-21 MED ORDER — LACTATED RINGERS IV SOLN
INTRAVENOUS | Status: DC | PRN
  Administered 2013-05-21: 19:00:00 via INTRAVENOUS

## 2013-05-21 MED ORDER — METOCLOPRAMIDE HCL 5 MG/ML IJ SOLN
5.0000 mg | Freq: Three times a day (TID) | INTRAMUSCULAR | Status: DC | PRN
  Administered 2013-05-22: 10 mg via INTRAVENOUS
  Filled 2013-05-21: qty 2

## 2013-05-21 MED ORDER — HYDROMORPHONE HCL PF 1 MG/ML IJ SOLN
0.5000 mg | INTRAMUSCULAR | Status: DC | PRN
  Administered 2013-05-23: 0.5 mg via INTRAVENOUS
  Filled 2013-05-21: qty 1

## 2013-05-21 MED ORDER — ONDANSETRON HCL 4 MG/2ML IJ SOLN: 4.0000 mg | Freq: Four times a day (QID) | INTRAMUSCULAR | Status: DC | PRN

## 2013-05-21 MED ORDER — ONDANSETRON HCL 4 MG PO TABS: 4.0000 mg | ORAL_TABLET | Freq: Four times a day (QID) | ORAL | Status: DC | PRN

## 2013-05-21 MED ORDER — VANCOMYCIN HCL 500 MG IV SOLR
500.0000 mg | INTRAVENOUS | Status: DC
  Administered 2013-05-21: 02:00:00 500 mg via INTRAVENOUS
  Filled 2013-05-21: qty 500

## 2013-05-21 MED ORDER — LIDOCAINE HCL (CARDIAC) 20 MG/ML IV SOLN
INTRAVENOUS | Status: DC | PRN
  Administered 2013-05-21: 100 mg via INTRAVENOUS

## 2013-05-21 MED ORDER — PROMETHAZINE HCL 25 MG/ML IJ SOLN: 6.2500 mg | INTRAMUSCULAR | Status: DC | PRN

## 2013-05-21 MED ORDER — METOCLOPRAMIDE HCL 10 MG PO TABS: 5.0000 mg | ORAL_TABLET | Freq: Three times a day (TID) | ORAL | Status: DC | PRN

## 2013-05-21 MED ORDER — HYDROMORPHONE HCL PF 1 MG/ML IJ SOLN: 0.2500 mg | INTRAMUSCULAR | Status: DC | PRN

## 2013-05-21 MED ORDER — OXYCODONE HCL 5 MG/5ML PO SOLN: 5.0000 mg | Freq: Once | ORAL | Status: DC | PRN

## 2013-05-21 MED ORDER — MIDAZOLAM HCL 5 MG/5ML IJ SOLN
INTRAMUSCULAR | Status: DC | PRN
  Administered 2013-05-21: 2 mg via INTRAVENOUS

## 2013-05-21 MED ORDER — OXYCODONE-ACETAMINOPHEN 5-325 MG PO TABS
1.0000 | ORAL_TABLET | ORAL | Status: DC | PRN
  Administered 2013-05-23 – 2013-05-24 (×4): 2 via ORAL
  Filled 2013-05-21 (×4): qty 2

## 2013-05-21 MED ORDER — FENTANYL CITRATE 0.05 MG/ML IJ SOLN
INTRAMUSCULAR | Status: DC | PRN
  Administered 2013-05-21: 50 ug via INTRAVENOUS

## 2013-05-21 MED ORDER — SODIUM CHLORIDE 0.9 % IV SOLN
INTRAVENOUS | Status: DC
  Administered 2013-05-22: 09:00:00 via INTRAVENOUS

## 2013-05-21 MED ORDER — OXYCODONE HCL 5 MG PO TABS: 5.0000 mg | ORAL_TABLET | Freq: Once | ORAL | Status: DC | PRN

## 2013-05-21 MED ORDER — VANCOMYCIN HCL IN DEXTROSE 750-5 MG/150ML-% IV SOLN
750.0000 mg | INTRAVENOUS | Status: DC
  Administered 2013-05-22 – 2013-05-24 (×3): 750 mg via INTRAVENOUS
  Filled 2013-05-21 (×4): qty 150

## 2013-05-21 MED ORDER — PROPOFOL 10 MG/ML IV BOLUS
INTRAVENOUS | Status: DC | PRN
  Administered 2013-05-21: 200 mg via INTRAVENOUS

## 2013-05-21 MED ORDER — HYDROCODONE-ACETAMINOPHEN 5-325 MG PO TABS
1.0000 | ORAL_TABLET | ORAL | Status: DC | PRN
  Filled 2013-05-21 (×3): qty 2

## 2013-05-21 SURGICAL SUPPLY — 38 items
BANDAGE GAUZE ELAST BULKY 4 IN (GAUZE/BANDAGES/DRESSINGS) ×3 IMPLANT
BLADE SAW SGTL HD 18.5X60.5X1. (BLADE) ×2 IMPLANT
BLADE SURG 10 STRL SS (BLADE) IMPLANT
BNDG COHESIVE 4X5 TAN STRL (GAUZE/BANDAGES/DRESSINGS) ×3 IMPLANT
CLOTH BEACON ORANGE TIMEOUT ST (SAFETY) ×1 IMPLANT
COVER SURGICAL LIGHT HANDLE (MISCELLANEOUS) ×2 IMPLANT
CUFF TOURNIQUET SINGLE 34IN LL (TOURNIQUET CUFF) IMPLANT
CUFF TOURNIQUET SINGLE 44IN (TOURNIQUET CUFF) IMPLANT
DRAPE U-SHAPE 47X51 STRL (DRAPES) ×2 IMPLANT
DRSG ADAPTIC 3X8 NADH LF (GAUZE/BANDAGES/DRESSINGS) ×2 IMPLANT
DRSG PAD ABDOMINAL 8X10 ST (GAUZE/BANDAGES/DRESSINGS) ×2 IMPLANT
DURAPREP 26ML APPLICATOR (WOUND CARE) ×2 IMPLANT
ELECT REM PT RETURN 9FT ADLT (ELECTROSURGICAL) ×2
ELECTRODE REM PT RTRN 9FT ADLT (ELECTROSURGICAL) ×1 IMPLANT
GLOVE BIOGEL PI IND STRL 9 (GLOVE) ×1 IMPLANT
GLOVE BIOGEL PI INDICATOR 9 (GLOVE) ×1
GLOVE SURG ORTHO 9.0 STRL STRW (GLOVE) ×2 IMPLANT
GOWN PREVENTION PLUS XLARGE (GOWN DISPOSABLE) ×2 IMPLANT
GOWN SRG XL XLNG 56XLVL 4 (GOWN DISPOSABLE) ×2 IMPLANT
GOWN STRL NON-REIN XL XLG LVL4 (GOWN DISPOSABLE) ×2
KIT BASIN OR (CUSTOM PROCEDURE TRAY) ×2 IMPLANT
KIT ROOM TURNOVER OR (KITS) ×2 IMPLANT
MANIFOLD NEPTUNE II (INSTRUMENTS) ×1 IMPLANT
NS IRRIG 1000ML POUR BTL (IV SOLUTION) ×2 IMPLANT
PACK ORTHO EXTREMITY (CUSTOM PROCEDURE TRAY) ×2 IMPLANT
PAD ARMBOARD 7.5X6 YLW CONV (MISCELLANEOUS) ×4 IMPLANT
PAD CAST 4YDX4 CTTN HI CHSV (CAST SUPPLIES) ×1 IMPLANT
PADDING CAST COTTON 4X4 STRL (CAST SUPPLIES) ×2
SPONGE GAUZE 4X4 12PLY (GAUZE/BANDAGES/DRESSINGS) ×2 IMPLANT
SPONGE LAP 18X18 X RAY DECT (DISPOSABLE) ×2 IMPLANT
STAPLER VISISTAT 35W (STAPLE) ×2 IMPLANT
SUT ETHILON 2 0 PSLX (SUTURE) ×5 IMPLANT
SUT VIC AB 2-0 CTB1 (SUTURE) ×2 IMPLANT
TOWEL OR 17X24 6PK STRL BLUE (TOWEL DISPOSABLE) ×2 IMPLANT
TOWEL OR 17X26 10 PK STRL BLUE (TOWEL DISPOSABLE) ×2 IMPLANT
TUBE CONNECTING 12X1/4 (SUCTIONS) ×2 IMPLANT
WATER STERILE IRR 1000ML POUR (IV SOLUTION) ×1 IMPLANT
YANKAUER SUCT BULB TIP NO VENT (SUCTIONS) ×2 IMPLANT

## 2013-05-21 NOTE — Anesthesia Postprocedure Evaluation (Signed)
  Anesthesia Post-op Note  Patient: [name]  Procedure(s) Performed: Procedure(s) with comments: Left Midfoot AMPUTATION (Left) - Left Midfoot Amputation  Patient Location: PACU  Anesthesia Type:General  Level of Consciousness: awake and alert   Airway and Oxygen Therapy: Patient Spontanous Breathing  Post-op Pain: mild  Post-op Assessment: Post-op Vital signs reviewed, Patient's Cardiovascular Status Stable, Respiratory Function Stable, Patent Airway, No signs of Nausea or vomiting and Pain level controlled  Post-op Vital Signs: Reviewed and stable  Complications: No apparent anesthesia complications

## 2013-05-21 NOTE — Preoperative (Signed)
Beta Blockers   Reason not to administer Beta Blockers:Not Applicable 

## 2013-05-21 NOTE — Anesthesia Preprocedure Evaluation (Signed)
Anesthesia Evaluation    Airway Mallampati: II TM Distance: >3 FB Neck ROM: Full    Dental   Pulmonary former smoker,  + rhonchi         Cardiovascular Rhythm:Regular Rate:Normal     Neuro/Psych  Headaches, Seizures -,  Bipolar Disorder    GI/Hepatic (+) Hepatitis -, C  Endo/Other  diabetes  Renal/GU      Musculoskeletal   Abdominal   Peds  Hematology   Anesthesia Other Findings   Reproductive/Obstetrics                           Anesthesia Physical Anesthesia Plan  ASA: III  Anesthesia Plan: General   Post-op Pain Management:    Induction: Intravenous  Airway Management Planned: LMA  Additional Equipment:   Intra-op Plan:   Post-operative Plan: Extubation in OR  Informed Consent: I have reviewed the patients History and Physical, chart, labs and discussed the procedure including the risks, benefits and alternatives for the proposed anesthesia with the patient or authorized representative who has indicated his/her understanding and acceptance.     Plan Discussed with: CRNA and Surgeon  Anesthesia Plan Comments:         Anesthesia Quick Evaluation

## 2013-05-21 NOTE — Progress Notes (Addendum)
ANTIBIOTIC CONSULT NOTE - FOLLOW UP  Pharmacy Consult for vancomycin Indication: left foot ulcer infection    Allergies  Allergen Reactions  . Gabapentin Itching and Swelling  . Penicillins Hives    Patient Measurements: Height: 5\' 4"  (162.6 cm) Weight: 172 lb 9.9 oz (78.3 kg) IBW/kg (Calculated) : 54.7  Vital Signs: Temp: 98.8 F (37.1 C) (10/16 2123) Temp src: Oral (10/16 2123) BP: 147/88 mmHg (10/16 2123) Pulse Rate: 98 (10/16 2123) Intake/Output from previous day: 10/16 0701 - 10/17 0700 In: 1412.9 [P.O.:420; I.V.:992.9] Out: -  Intake/Output from this shift:    Labs:  Recent Labs  05/18/13 0545 05/19/13 0500 05/19/13 0811 05/20/13 0628  WBC 19.3* 14.5*  --  12.2*  HGB 8.9* 7.8*  --  8.0*  PLT 301 261  --  262  LABCREA  --   --  91.73  --   CREATININE 1.52* 2.06*  --  2.34*   Estimated Creatinine Clearance: 30.4 ml/min (by C-G formula based on Cr of 2.34).  Recent Labs  05/21/13 0005  VANCOTROUGH 16.2      Assessment: 47 yo female with cellulitis and abscess of the left foot on vancomycin and cefepime. Vanc trough 16.2 mcg/ml   Goal of Therapy:  Vancomycin trough level 10-15 mcg/ml  Plan:  -Change vancomycin to 500mg  IV q24hr -Will follow SCr trend  Lora Seay, Pharm D 05/21/2013 1:01 AM  Addendum: Pts Scr is now trending back down this AM.  Plan: 1. Increase vanc back up to 750mg  IV Q24H 2. F/u renal fxn and adjust accordingly  Salome Arnt, PharmD, BCPS Pager # [phone] 05/21/2013 8:56 AM

## 2013-05-21 NOTE — Transfer of Care (Signed)
Immediate Anesthesia Transfer of Care Note  Patient: [name]  Procedure(s) Performed: Procedure(s) with comments: Left Midfoot AMPUTATION (Left) - Left Midfoot Amputation  Patient Location: PACU  Anesthesia Type:General  Level of Consciousness: awake, sedated and patient cooperative  Airway & Oxygen Therapy: Patient Spontanous Breathing and Patient connected to nasal cannula oxygen  Post-op Assessment: Report given to PACU RN and Post -op Vital signs reviewed and stable  Post vital signs: Reviewed and stable  Complications: No apparent anesthesia complications

## 2013-05-21 NOTE — Op Note (Signed)
OPERATIVE REPORT  DATE OF SURGERY: 05/21/2013  PATIENT:  [name],  47 y.o. 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 She has failed conservative treatment including IV antibiotics and presents at this time for surgical intervention. Risks and benefits were discussed including infection neurovascular injury nonhealing of the wound need for higher level amputation. Patient states she understands and wished to proceed at this time. Description of procedure patient was brought to the operating room and underwent a general anesthetic. After adequate levels of anesthesia were obtained patient's left lower extremity was prepped using DuraPrep and draped into a sterile field. A fishmouth incision was made through the midfoot she underwent a transmetatarsal amputation to the base of the metatarsals. Electrocautery was used for hemostasis the wound was irrigated with normal saline and the incision was closed using 2-0 nylon. The wounds were covered with Adaptic orthopedic sponges AB dressing Kerlix and Coban.  PLAN OF CARE: Admit to inpatient   PATIENT DISPOSITION:  PACU - hemodynamically stable.   Newt Minion, MD 05/21/2013 7:47 PM

## 2013-05-21 NOTE — H&P (View-Only) (Signed)
Patient ID: [name], female   DOB: 04/03/1966, 47 y.o.   MRN: [id number] MRI reviewed. The abscess does not appear to be proximal to the midfoot. Anticipate patient has a good chance of healing a midfoot amputation. Will plan for a midfoot amputation tomorrow afternoon. Risks and benefits were discussed including the risk of nonhealing and the risk of need for higher level amputation. The importance of strict nonweightbearing postoperatively was discussed to ensure wound healing.

## 2013-05-21 NOTE — Anesthesia Procedure Notes (Signed)
Procedure Name: LMA Insertion Date/Time: 05/21/2013 6:46 PM Performed by: Valetta Fuller Pre-anesthesia Checklist: Patient identified, Emergency Drugs available, Suction available and Patient being monitored Patient Re-evaluated:Patient Re-evaluated prior to inductionOxygen Delivery Method: Circle system utilized Preoxygenation: Pre-oxygenation with 100% oxygen Intubation Type: IV induction Ventilation: Mask ventilation without difficulty LMA: LMA inserted LMA Size: 4.0 Number of attempts: 1 Placement Confirmation: positive ETCO2 and breath sounds checked- equal and bilateral Dental Injury: Teeth and Oropharynx as per pre-operative assessment  Comments: Atraumatic insertion of LMA by York Grice, CRNA

## 2013-05-21 NOTE — Progress Notes (Signed)
Pt signed blood refusal form. Place in chart. MD notified.   Delman Cheadle

## 2013-05-21 NOTE — Progress Notes (Signed)
[facility] Progress Note    [name] [id number] DOB: 05-08-1966 DOA: 05/17/2013 PCP: Leeroy Cha  HPI/Subjective: Pain is well-controlled with IV narcotics now, she is going to have left midfoot amputation.    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 She states she was suing a physician for malpractice and moved up her because her family would support her. However, she has not had a specific place to stay since coming up here.  She has been seeing a Dr at [facility] for her medical issues but tells me she no longer has transport to get there.    Had a nail thru her foot two weeks ago, but did not feel it.  Now with Sepsis secondary to foot wound.    Assessment/Plan: Principal Problem:  Sepsis secondary to Cellulitis and abscess of left foot - Fever 102, tachycardia, elevated WBC - Blood cx show NGTD - Wound culture is growing group B Streptococcus - h/o MRSA- cont Vanc and Cefepime - Appreciate Dr. Jess Barters consultation.  Patient to go to the OR on Friday 10/17. For mid foot amputation.  DM type 2 (diabetes mellitus, type 2) - last A1c in 7/14 was 9.2 - likely non-compliant with insulin given A1C - currently on lantus and SSI - diabetic Coordinator for education.  AKI -Secondary to infection, on IV fluids. Lisinopril held. -Discussed with the patient, patient mentioned that she she was about to be on dialysis about 5 years ago but her kidney function improved. -Follow closely.  Hyponatremia -Patient does have history of cirrhosis, cannot rule out chronic liver disease. - patient is on IV fluids, started to develop anasarca with third spacing secondary to low albumin.  HTN (hypertension), benign - added Coreg- she was on this in the past but it was stopped on the last admission due to hypotension - Lisinopril being  held due to elevated creatinine.  Seizure disorder -Stable. -cont Keppra  Hypokalemia - replaced  History of MRSA abscesses - has been treated on and off with Doxy- last prescribed on 9/19 by [facility]- pt did not f/u after this appt  Hep C w/o coma, chronic -LFTs stable -No encephalopathy -No volume overload.  Low albumin (1.1) -total protein 5.3 - 6.9  -Nutrition consultation. -This is likely secondary to chronic liver disease  Jehovah's Witness -Patient is Jehovah's Witness and she refused them any blood products transfusion   Code Status: full code Family Communication: none Disposition Plan: transfer to med/surg  Consultants: Dr. Sharol Given  Procedures: OR planned for 10/17  Antibiotics: VAnc/ Cefepime- 10/13  DVT prophylaxis: Heparin     Objective: Blood pressure 130/84, pulse 94, temperature 99.8 F (37.7 C), temperature source Oral, resp. rate 16, height 5\' 4"  (1.626 m), weight 78.3 kg (172 lb 9.9 oz), SpO2 98.00%.  Intake/Output Summary (Last 24 hours) at 05/21/13 1241 Last data filed at 05/21/13 [id number]  Gross per 24 hour  Intake 2772.92 ml  Output      0 ml  Net 2772.92 ml     Exam: General: A&O x 3, animated, NAD Lungs: Clear to auscultation bilaterally without wheezes or crackles Cardiovascular: Regular rate and rhythm without murmur gallop or rub normal S1 and S2 Abdomen: Nontender, nondistended, soft, bowel sounds positive, no rebound, no ascites, no appreciable mass Extremities:  edema left lower extremity with 1" annular wound on both dorsal and plantar portions of her  foot as though it is thru and thru. Skin:  Multiple annular scars noted on lower extremities (healed abscesses?)   Data Reviewed: Basic Metabolic Panel:  Recent Labs Lab 05/18/13 0055 05/18/13 0545 05/19/13 0500 05/20/13 0628 05/21/13 0520  NA 129* 128* 125* 127* 131*  K 3.9 4.0 3.4* 3.7 3.5  CL 97 98 98 98 101  CO2 20 21 19 20 22   GLUCOSE 337* 227* 196*  337* 150*  BUN 10 11 18  25* 19  CREATININE 1.25* 1.52* 2.06* 2.34* 1.84*  CALCIUM 6.7* 6.9* 6.8* 7.1* 7.3*   Liver Function Tests:  Recent Labs Lab 05/17/13 2003 05/19/13 0500 05/20/13 0628 05/21/13 0520  AST 23 16 17 20   ALT 20 13 14 14   ALKPHOS 112 82 123* 122*  BILITOT 0.5 0.2* 0.2* 0.2*  PROT 6.9 5.3* 5.8* 5.6*  ALBUMIN 1.6* 1.1* 1.2* 1.1*   CBC:  Recent Labs Lab 05/17/13 2003 05/17/13 2015 05/18/13 0545 05/19/13 0500 05/20/13 0628 05/21/13 0520  WBC 19.6*  --  19.3* 14.5* 12.2* 11.7*  NEUTROABS 16.3*  --   --   --   --   --   HGB 11.1* 12.2 8.9* 7.8* 8.0* 7.8*  HCT 31.2* 36.0 25.1* 22.3* 22.7* 22.4*  MCV 80.8  --  81.0 81.4 81.4 80.6  PLT 324  --  301 261 262 263   CBG:  Recent Labs Lab 05/20/13 1243 05/20/13 1723 05/20/13 2126 05/21/13 0745 05/21/13 1144  GLUCAP 334* 164* 136* 146* 119*    Recent Results (from the past 240 hour(s))  WOUND CULTURE     Status: None   Collection Time    05/17/13  9:35 PM      Result Value Range Status   Specimen Description WOUND LEFT FOOT   Final   Special Requests NONE   Final   Gram Stain     Final   Value: NO WBC SEEN     NO SQUAMOUS EPITHELIAL CELLS SEEN     MODERATE GRAM POSITIVE COCCI IN PAIRS     Performed at [facility]   Culture     Final   Value: ABUNDANT GROUP B STREP(S.AGALACTIAE)ISOLATED     Note: TESTING AGAINST S. AGALACTIAE NOT ROUTINELY PERFORMED DUE TO PREDICTABILITY OF AMP/PEN/VAN SUSCEPTIBILITY.     Performed at [facility]   Report Status 05/20/2013 FINAL   Final  CULTURE, BLOOD (ROUTINE X 2)     Status: None   Collection Time    05/17/13  9:55 PM      Result Value Range Status   Specimen Description BLOOD LEFT HAND   Final   Special Requests BOTTLES DRAWN AEROBIC ONLY 10ML   Final   Culture  Setup Time     Final   Value: 05/18/2013 07:40     Performed at [facility]   Culture     Final   Value:        BLOOD CULTURE RECEIVED NO GROWTH TO DATE CULTURE WILL  BE HELD FOR 5 DAYS BEFORE ISSUING A FINAL NEGATIVE REPORT     Performed at [facility]   Report Status PENDING   Incomplete  CULTURE, BLOOD (ROUTINE X 2)     Status: None   Collection Time    05/17/13  9:55 PM      Result Value Range Status   Specimen Description BLOOD RIGHT ARM   Final   Special Requests BOTTLES DRAWN AEROBIC ONLY 10ML   Final   Culture  Setup Time     Final   Value: 05/18/2013 07:40     Performed at [facility]   Culture     Final   Value:        BLOOD CULTURE RECEIVED NO GROWTH TO DATE CULTURE WILL BE HELD FOR 5 DAYS BEFORE ISSUING A FINAL NEGATIVE REPORT     Performed at [facility]   Report Status PENDING   Incomplete  MRSA PCR SCREENING     Status: None   Collection Time    05/18/13 12:55 AM      Result Value Range Status   MRSA by PCR NEGATIVE  NEGATIVE Final   Comment:            The GeneXpert MRSA Assay (FDA     approved for NASAL specimens     only), is one component of a     comprehensive MRSA colonization     surveillance program. It is not     intended to diagnose MRSA     infection nor to guide or     monitor treatment for     MRSA infections.  SURGICAL PCR SCREEN     Status: None   Collection Time    05/21/13  7:52 AM      Result Value Range Status   MRSA, PCR NEGATIVE  NEGATIVE Final   Staphylococcus aureus NEGATIVE  NEGATIVE Final   Comment:            The Xpert SA Assay (FDA     approved for NASAL specimens     in patients over 14 years of age),     is one component of     a comprehensive surveillance     program.  Test performance has     been validated by [employer] for patients greater     than or equal to 55 year old.     It is not intended     to diagnose infection nor to     guide or monitor treatment.     Studies:  Recent x-ray studies have been reviewed in detail by the Attending Physician  Scheduled Meds:  Scheduled Meds: . carvedilol  6.25 mg Oral BID WC  . ceFEPime (MAXIPIME) IV   2 g Intravenous Q24H  . DULoxetine  60 mg Oral Daily  . feeding supplement (GLUCERNA SHAKE)  237 mL Oral BID BM  . heparin  5,000 Units Subcutaneous Q8H  . insulin aspart  0-9 Units Subcutaneous TID WC  . insulin glargine  10 Units Subcutaneous Daily  . levETIRAcetam  1,000 mg Oral QHS  . multivitamin with minerals  1 tablet Oral Daily  . senna-docusate  1 tablet Oral QHS  . sodium chloride  10-40 mL Intracatheter Q12H  . sodium chloride  3 mL Intravenous Q12H  . [START ON 05/22/2013] vancomycin  750 mg Intravenous Q24H   Continuous Infusions: . sodium chloride 100 mL/hr at 05/21/13 0210     [facility] Pager: [phone]   If 7PM-7AM, please contact night-coverage [email or web address] Password [email or web address] 05/21/2013, 12:41 PM   LOS: 4 days

## 2013-05-21 NOTE — Interval H&P Note (Signed)
History and Physical Interval Note:  05/21/2013 5:56 AM  [name]  has presented today for surgery, with the diagnosis of Large Abscess Left foot midfoot  The various methods of treatment have been discussed with the patient and family. After consideration of risks, benefits and other options for treatment, the patient has consented to  Procedure(s) with comments: AMPUTATION FOOT (Left) - Left Midfoot Amputation as a surgical intervention .  The patient's history has been reviewed, patient examined, no change in status, stable for surgery.  I have reviewed the patient's chart and labs.  Questions were answered to the patient's satisfaction.     Bambi Fehnel V

## 2013-05-22 LAB — GLUCOSE, CAPILLARY
Glucose-Capillary: 336 mg/dL — ABNORMAL HIGH (ref 70–99)
Glucose-Capillary: 236 mg/dL — ABNORMAL HIGH (ref 70–99)
Glucose-Capillary: 220 mg/dL — ABNORMAL HIGH (ref 70–99)

## 2013-05-22 MED ORDER — HYDRALAZINE HCL 20 MG/ML IJ SOLN
10.0000 mg | INTRAMUSCULAR | Status: DC | PRN
  Administered 2013-05-22 – 2013-05-23 (×2): 10 mg via INTRAVENOUS
  Filled 2013-05-22 (×2): qty 1

## 2013-05-22 NOTE — Progress Notes (Signed)
Subjective: 1 Day Post-Op Procedure(s) (LRB): Left Midfoot AMPUTATION (Left) Patient reports pain as mild.  She states she is hurting , Hx of pain meds before surgery plus narcotic use non-Rx in past.  Objective: Vital signs in last 24 hours: Temp:  [96.8 F (36 C)-98.9 F (37.2 C)] 98.9 F (37.2 C) (10/18 0514) Pulse Rate:  [88-99] 90 (10/18 0759) Resp:  [17-20] 18 (10/18 0514) BP: (112-164)/(68-96) 140/80 mmHg (10/18 0759) SpO2:  [95 %-100 %] 95 % (10/18 0514)  Intake/Output from previous day: 10/17 0701 - 10/18 0700 In: 1920 [P.O.:120; I.V.:1600; IV Piggyback:200] Out: 300 [Urine:300] Intake/Output this shift:     Recent Labs  05/20/13 0628 05/21/13 0520  HGB 8.0* 7.8*    Recent Labs  05/20/13 0628 05/21/13 0520  WBC 12.2* 11.7*  RBC 2.79* 2.78*  HCT 22.7* 22.4*  PLT 262 263    Recent Labs  05/20/13 0628 05/21/13 0520  NA 127* 131*  K 3.7 3.5  CL 98 101  CO2 20 22  BUN 25* 19  CREATININE 2.34* 1.84*  GLUCOSE 337* 150*  CALCIUM 7.1* 7.3*   No results found for this basename: LABPT, INR,  in the last 72 hours  dressing dry  Assessment/Plan: 1 Day Post-Op Procedure(s) (LRB): Left Midfoot AMPUTATION (Left)   Plan OOB to recliner.  Therapy with walker  [name] C 05/22/2013, 12:33 PM

## 2013-05-22 NOTE — Plan of Care (Signed)
Problem: Phase II Progression Outcomes Goal: IV changed to normal saline lock Outcome: Not Met (add Reason) PICC line Goal: Obtain order to discontinue catheter if appropriate Outcome: Not Met (add Reason) PICC line for antibiotics  Goal: Other Phase II Outcomes/Goals Outcome: Not Met (add Reason) PICC line for antibiotics

## 2013-05-22 NOTE — Progress Notes (Signed)
[facility] Progress Note    [name] [id number] DOB: Dec 19, 1965 DOA: 05/17/2013 PCP: Leeroy Cha  HPI/Subjective: Feels much better, pain is controlled. Denies any specific complaints.  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 She states she was suing a physician for malpractice and moved up her because her family would support her. However, she has not had a specific place to stay since coming up here.  She has been seeing a Dr at [facility] for her medical issues but tells me she no longer has transport to get there.   Had a nail thru her foot two weeks ago, but did not feel it.  Now with Sepsis secondary to foot wound.  Assessment/Plan: Principal Problem:  Sepsis secondary to Cellulitis and abscess of left foot - Fever 102, tachycardia, elevated WBC - Blood cx show NGTD - Wound culture (gm+ Cocci in pairs) - h/o MRSA- cont Vanc and Cefepime - Status post left midfoot amputation done by Dr. Sharol Given.  DM type 2 (diabetes mellitus, type 2) - last A1c in 7/14 was 9.2 - likely non-compliant with insulin given A1C - currently on lantus and SSI - diabetic Coordinator for education.  AKI -Secondary to infection, on IV fluids. Lisinopril held. -Discussed with the patient, patient mentioned that she she was about to be on dialysis about 5 years ago but her kidney function improved. -Follow closely.  Hyponatremia -Patient does have history of cirrhosis, cannot rule out chronic liver disease. - patient is on IV fluids, started to develop anasarca with third spacing secondary to low albumin.  HTN (hypertension), benign - added Coreg- she was on this in the past but it was stopped on the last admission due to hypotension - Lisinopril being held due to elevated creatinine.  Seizure disorder -Stable. -cont Keppra  Hypokalemia -  replaced  History of MRSA abscesses - has been treated on and off with Doxy- last prescribed on 9/19 by [facility]- pt did not f/u after this appt  Hep C w/o coma, chronic -LFTs stable -No encephalopathy -No volume overload.  Low albumin (1.1) -total protein 5.3 - 6.9  -Nutrition consultation. -This is likely secondary to chronic liver disease  Jehovah's Witness -Patient is Jehovah's Witness and she refused any blood products transfusion. -As patient is anemic, minimize blood draws.  Code Status: full code Family Communication: none Disposition Plan: transfer to med/surg  Consultants: Dr. Sharol Given  Procedures: OR planned for 10/17  Antibiotics: VAnc/ Cefepime- 10/13  DVT prophylaxis: Heparin     Objective: Blood pressure 140/80, pulse 90, temperature 98.9 F (37.2 C), temperature source Oral, resp. rate 18, height 5\' 4"  (1.626 m), weight 78.3 kg (172 lb 9.9 oz), SpO2 95.00%.  Intake/Output Summary (Last 24 hours) at 05/22/13 1116 Last data filed at 05/22/13 0254  Gross per 24 hour  Intake   1920 ml  Output    300 ml  Net   1620 ml     Exam: General: A&O x 3, animated, NAD Lungs: Clear to auscultation bilaterally without wheezes or crackles Cardiovascular: Regular rate and rhythm without murmur gallop or rub normal S1 and S2 Abdomen: Nontender, nondistended, soft, bowel sounds positive, no rebound, no ascites, no appreciable mass Extremities:  edema left lower extremity with 1" annular wound on both dorsal and plantar portions of her foot as though it is thru and thru. Skin:  Multiple annular scars  noted on lower extremities (healed abscesses?)   Data Reviewed: Basic Metabolic Panel:  Recent Labs Lab 05/18/13 0055 05/18/13 0545 05/19/13 0500 05/20/13 0628 05/21/13 0520  NA 129* 128* 125* 127* 131*  K 3.9 4.0 3.4* 3.7 3.5  CL 97 98 98 98 101  CO2 20 21 19 20 22   GLUCOSE 337* 227* 196* 337* 150*  BUN 10 11 18  25* 19  CREATININE 1.25* 1.52*  2.06* 2.34* 1.84*  CALCIUM 6.7* 6.9* 6.8* 7.1* 7.3*   Liver Function Tests:  Recent Labs Lab 05/17/13 2003 05/19/13 0500 05/20/13 0628 05/21/13 0520  AST 23 16 17 20   ALT 20 13 14 14   ALKPHOS 112 82 123* 122*  BILITOT 0.5 0.2* 0.2* 0.2*  PROT 6.9 5.3* 5.8* 5.6*  ALBUMIN 1.6* 1.1* 1.2* 1.1*   CBC:  Recent Labs Lab 05/17/13 2003 05/17/13 2015 05/18/13 0545 05/19/13 0500 05/20/13 0628 05/21/13 0520  WBC 19.6*  --  19.3* 14.5* 12.2* 11.7*  NEUTROABS 16.3*  --   --   --   --   --   HGB 11.1* 12.2 8.9* 7.8* 8.0* 7.8*  HCT 31.2* 36.0 25.1* 22.3* 22.7* 22.4*  MCV 80.8  --  81.0 81.4 81.4 80.6  PLT 324  --  301 261 262 263   CBG:  Recent Labs Lab 05/21/13 1144 05/21/13 1543 05/21/13 1918 05/21/13 2148 05/22/13 0743  GLUCAP 119* 138* 128* 196* 373*    Recent Results (from the past 240 hour(s))  WOUND CULTURE     Status: None   Collection Time    05/17/13  9:35 PM      Result Value Range Status   Specimen Description WOUND LEFT FOOT   Final   Special Requests NONE   Final   Gram Stain     Final   Value: NO WBC SEEN     NO SQUAMOUS EPITHELIAL CELLS SEEN     MODERATE GRAM POSITIVE COCCI IN PAIRS     Performed at [facility]   Culture     Final   Value: ABUNDANT GROUP B STREP(S.AGALACTIAE)ISOLATED     Note: TESTING AGAINST S. AGALACTIAE NOT ROUTINELY PERFORMED DUE TO PREDICTABILITY OF AMP/PEN/VAN SUSCEPTIBILITY.     Performed at [facility]   Report Status 05/20/2013 FINAL   Final  CULTURE, BLOOD (ROUTINE X 2)     Status: None   Collection Time    05/17/13  9:55 PM      Result Value Range Status   Specimen Description BLOOD LEFT HAND   Final   Special Requests BOTTLES DRAWN AEROBIC ONLY 10ML   Final   Culture  Setup Time     Final   Value: 05/18/2013 07:40     Performed at [facility]   Culture     Final   Value:        BLOOD CULTURE RECEIVED NO GROWTH TO DATE CULTURE WILL BE HELD FOR 5 DAYS BEFORE ISSUING A FINAL NEGATIVE REPORT      Performed at [facility]   Report Status PENDING   Incomplete  CULTURE, BLOOD (ROUTINE X 2)     Status: None   Collection Time    05/17/13  9:55 PM      Result Value Range Status   Specimen Description BLOOD RIGHT ARM   Final   Special Requests BOTTLES DRAWN AEROBIC ONLY 10ML   Final   Culture  Setup Time     Final   Value: 05/18/2013 07:40  Performed at [facility]     Final   Value:        BLOOD CULTURE RECEIVED NO GROWTH TO DATE CULTURE WILL BE HELD FOR 5 DAYS BEFORE ISSUING A FINAL NEGATIVE REPORT     Performed at [facility]   Report Status PENDING   Incomplete  MRSA PCR SCREENING     Status: None   Collection Time    05/18/13 12:55 AM      Result Value Range Status   MRSA by PCR NEGATIVE  NEGATIVE Final   Comment:            The GeneXpert MRSA Assay (FDA     approved for NASAL specimens     only), is one component of a     comprehensive MRSA colonization     surveillance program. It is not     intended to diagnose MRSA     infection nor to guide or     monitor treatment for     MRSA infections.  SURGICAL PCR SCREEN     Status: None   Collection Time    05/21/13  7:52 AM      Result Value Range Status   MRSA, PCR NEGATIVE  NEGATIVE Final   Staphylococcus aureus NEGATIVE  NEGATIVE Final   Comment:            The Xpert SA Assay (FDA     approved for NASAL specimens     in patients over 31 years of age),     is one component of     a comprehensive surveillance     program.  Test performance has     been validated by [employer] for patients greater     than or equal to 37 year old.     It is not intended     to diagnose infection nor to     guide or monitor treatment.     Studies:  Recent x-ray studies have been reviewed in detail by the Attending Physician  Scheduled Meds:  Scheduled Meds: . carvedilol  6.25 mg Oral BID WC  . ceFEPime (MAXIPIME) IV  2 g Intravenous Q24H  . DULoxetine  60 mg Oral Daily  .  feeding supplement (GLUCERNA SHAKE)  237 mL Oral BID BM  . heparin  5,000 Units Subcutaneous Q8H  . insulin aspart  0-9 Units Subcutaneous TID WC  . insulin glargine  10 Units Subcutaneous Daily  . levETIRAcetam  1,000 mg Oral QHS  . multivitamin with minerals  1 tablet Oral Daily  . senna-docusate  1 tablet Oral QHS  . sodium chloride  10-40 mL Intracatheter Q12H  . sodium chloride  3 mL Intravenous Q12H  . vancomycin  750 mg Intravenous Q24H   Continuous Infusions: . sodium chloride 100 mL/hr at 05/21/13 2034  . sodium chloride 20 mL/hr at 05/22/13 [place] [facility] Pager: [phone]   If 7PM-7AM, please contact night-coverage [email or web address] Password [email or web address] 05/22/2013, 11:16 AM   LOS: 5 days

## 2013-05-22 NOTE — Evaluation (Signed)
Physical Therapy Evaluation Patient Details Name: [name] MRN: [id number] DOB: March 03, 1966 Today's Date: 05/22/2013 Time: [id number] PT Time Calculation (min): 15 min  PT Assessment / Plan / Recommendation History of Present Illness  Patient is a 47 yo female s/p Lt midfoot amputation due to abscess.  Clinical Impression  Patient presents with problems listed below.  Will benefit from acute PT to maximize independence prior to discharge.  Patient with decreased safety awareness - reports she has been up in room (no walker) on her own.  Reviewed NWB status with patient.    PT Assessment  Patient needs continued PT services    Follow Up Recommendations  Supervision - Intermittent    Does the patient have the potential to tolerate intense rehabilitation      Barriers to Discharge Decreased caregiver support Patient staying with different relatives.  Unsure of discharge destination.    Equipment Recommendations  Rolling walker with 5" wheels    Recommendations for Other Services     Frequency Min 5X/week    Precautions / Restrictions Precautions Precautions: Fall Required Braces or Orthoses: Other Brace/Splint (Post-op shoe) Other Brace/Splint: Post-op shoe Restrictions Weight Bearing Restrictions: Yes LLE Weight Bearing: Non weight bearing   Pertinent Vitals/Pain Pain limiting mobility      Mobility  Bed Mobility Bed Mobility: Supine to Sit;Sit to Supine Supine to Sit: 7: Independent Sit to Supine: 7: Independent Transfers Transfers: Sit to Stand;Stand to Sit Sit to Stand: 4: Min guard;From bed Stand to Sit: 4: Min guard;To bed Details for Transfer Assistance: Verbal cues for hand placement and technique to maintain NWB LLE. Ambulation/Gait Ambulation/Gait Assistance: 4: Min guard Ambulation Distance (Feet): 24 Feet Assistive device: Rolling walker Ambulation/Gait Assistance Details: Verbal cues for safe use of RW, gait sequence, and NWB status on LLE. Gait  Pattern: Step-to pattern    Exercises     PT Diagnosis: Difficulty walking;Acute pain  PT Problem List: Decreased strength;Decreased activity tolerance;Decreased balance;Decreased mobility;Decreased safety awareness;Decreased knowledge of use of DME;Decreased knowledge of precautions;Pain PT Treatment Interventions: DME instruction;Gait training;Functional mobility training;Cognitive remediation;Patient/family education     PT Goals(Current goals can be found in the care plan section) Acute Rehab PT Goals Patient Stated Goal: Somewhere to live PT Goal Formulation: With patient Time For Goal Achievement: 05/29/13 Potential to Achieve Goals: Good  Visit Information  Last PT Received On: 05/22/13 Assistance Needed: +1 History of Present Illness: Patient is a 47 yo female s/p Lt midfoot amputation due to abscess.       Prior [place] expects to be discharged to:: Private residence (Possibly to sister's home) Living Arrangements: Other relatives Available Help at Discharge: Family;Available PRN/intermittently Type of Home: House Home Access: Level entry (sister's home) Home Equipment: None Prior Function Level of Independence: Independent Communication Communication: No difficulties    Cognition  Cognition Arousal/Alertness: Awake/alert Behavior During Therapy: Restless;Impulsive Overall Cognitive Status: No family/caregiver present to determine baseline cognitive functioning (Patient with decreased safety awareness)    Extremity/Trunk Assessment Upper Extremity Assessment Upper Extremity Assessment: Overall WFL for tasks assessed Lower Extremity Assessment Lower Extremity Assessment: LLE deficits/detail RLE Sensation: history of peripheral neuropathy LLE Deficits / Details: Decreased strength and ROM due to surgery/pain LLE: Unable to fully assess due to pain LLE Sensation: history of peripheral neuropathy   Balance    End of Session PT -  End of Session Equipment Utilized During Treatment: Gait belt Activity Tolerance: Patient limited by fatigue;Patient limited by pain Patient left: in bed;with call  Imperato/phone within reach Nurse Communication: Mobility status;Weight bearing status  GP     Despina Pole 05/22/2013, 6:35 PM Carita Pian. Sanjuana Kava, [place] Pager [phone]

## 2013-05-22 NOTE — Progress Notes (Signed)
Orthopedic Tech Progress Note Patient Details:  [name] 1966-01-29 [id number]  Patient ID: [name], female   DOB: Jun 08, 1966, 47 y.o.   MRN: [id number]   Irish Elders 05/22/2013, 7:05 AMLeft post op shoe.

## 2013-05-23 DIAGNOSIS — IMO0001 Reserved for inherently not codable concepts without codable children: Secondary | ICD-10-CM

## 2013-05-23 LAB — GLUCOSE, CAPILLARY
Glucose-Capillary: 103 mg/dL — ABNORMAL HIGH (ref 70–99)
Glucose-Capillary: 111 mg/dL — ABNORMAL HIGH (ref 70–99)

## 2013-05-23 MED ORDER — BISACODYL 10 MG RE SUPP: 10.0000 mg | Freq: Every day | RECTAL | Status: DC | PRN

## 2013-05-23 MED ORDER — MAGNESIUM HYDROXIDE 400 MG/5ML PO SUSP
15.0000 mL | Freq: Once | ORAL | Status: AC
  Administered 2013-05-23: 14:00:00 15 mL via ORAL
  Filled 2013-05-23: qty 30

## 2013-05-23 MED ORDER — POLYETHYLENE GLYCOL 3350 17 G PO PACK
17.0000 g | PACK | Freq: Every day | ORAL | Status: DC
  Administered 2013-05-23 – 2013-05-24 (×2): 17 g via ORAL
  Filled 2013-05-23 (×2): qty 1

## 2013-05-23 NOTE — Progress Notes (Signed)
[facility] Progress Note    [name] [id number] DOB: 20-Dec-1965 DOA: 05/17/2013 PCP: Leeroy Cha  HPI/Subjective: Denies specific complaints today, still use IV pain medications for pain control.   Brief narrative: 46 y/o female with DM, Hep C cirrhosis, Bipolar disorder, Neuropathy, h/o crack/cocaine use in the past who recently moved here from [place]. She states she was suing a physician for malpractice and moved up her because her family would support her. However, she has not had a specific place to stay since coming up here.  She has been seeing a Dr at [facility] for her medical issues but tells me she no longer has transport to get there.   Had a nail thru her foot two weeks ago, but did not feel it.  Now with Sepsis secondary to foot wound.  Assessment/Plan: Principal Problem:  Sepsis secondary to Cellulitis and abscess of left foot - Fever 102, tachycardia, elevated WBC - Blood cx show NGTD - Wound culture showed group B strep - h/o MRSA- cont Vanc and Cefepime 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  DM type 2 (diabetes mellitus, type 2) - last A1c in 7/14 was 9.2 - likely non-compliant with insulin given A1C - currently on lantus and SSI - diabetic Coordinator for education.  AKI -Secondary to infection, on IV fluids. Lisinopril held. -Discussed with the patient, patient mentioned that she she was about to be on dialysis about 5 years ago but her kidney function improved. -Check BMP in a.m., currently blood draws minimized because of anemia.  Hyponatremia -Patient does have history of cirrhosis, cannot rule out chronic liver disease. -Patient is on IV fluids, started to develop anasarca with third spacing secondary to low albumin.  HTN (hypertension), benign - added Coreg- she was on this in the past but it was stopped on the last admission due to hypotension - Lisinopril being held due to  elevated creatinine.  Seizure disorder -Stable. -cont Keppra  Hypokalemia - replaced  History of MRSA abscesses - has been treated on and off with Doxy- last prescribed on 9/19 by [facility]- pt did not f/u after this appt  Hep C w/o coma, chronic -LFTs stable -No encephalopathy -No volume overload.  Low albumin (1.1) -total protein 5.3 - 6.9  -Nutrition consultation. -This is likely secondary to chronic liver disease  Jehovah's Witness -Patient is Jehovah's Witness and she refused any blood products transfusion. -As patient is anemic, minimize blood draws.  Code Status: full code Family Communication: none Disposition Plan: transfer to med/surg  Consultants: Dr. Sharol Given  Procedures: OR planned for 10/17  Antibiotics: VAnc/ Cefepime- 10/13  DVT prophylaxis: Heparin     Objective: Blood pressure 139/83, pulse 87, temperature 98.7 F (37.1 C), temperature source Oral, resp. rate 20, height 5\' 4"  (1.626 m), weight 78.3 kg (172 lb 9.9 oz), SpO2 98.00%.  Intake/Output Summary (Last 24 hours) at 05/23/13 1233 Last data filed at 05/23/13 0700  Gross per 24 hour  Intake    900 ml  Output      0 ml  Net    900 ml     Exam: General: A&O x 3, animated, NAD Lungs: Clear to auscultation bilaterally without wheezes or crackles Cardiovascular: Regular rate and rhythm without murmur gallop or rub normal S1 and S2 Abdomen: Nontender, nondistended, soft, bowel sounds positive, no rebound, no ascites, no appreciable mass Extremities:  edema left lower extremity with 1" annular wound on both dorsal and  plantar portions of her foot as though it is thru and thru. Skin:  Multiple annular scars noted on lower extremities (healed abscesses?)   Data Reviewed: Basic Metabolic Panel:  Recent Labs Lab 05/18/13 0055 05/18/13 0545 05/19/13 0500 05/20/13 0628 05/21/13 0520  NA 129* 128* 125* 127* 131*  K 3.9 4.0 3.4* 3.7 3.5  CL 97 98 98 98 101  CO2 20 21 19 20 22    GLUCOSE 337* 227* 196* 337* 150*  BUN 10 11 18  25* 19  CREATININE 1.25* 1.52* 2.06* 2.34* 1.84*  CALCIUM 6.7* 6.9* 6.8* 7.1* 7.3*   Liver Function Tests:  Recent Labs Lab 05/17/13 2003 05/19/13 0500 05/20/13 0628 05/21/13 0520  AST 23 16 17 20   ALT 20 13 14 14   ALKPHOS 112 82 123* 122*  BILITOT 0.5 0.2* 0.2* 0.2*  PROT 6.9 5.3* 5.8* 5.6*  ALBUMIN 1.6* 1.1* 1.2* 1.1*   CBC:  Recent Labs Lab 05/17/13 2003 05/17/13 2015 05/18/13 0545 05/19/13 0500 05/20/13 0628 05/21/13 0520  WBC 19.6*  --  19.3* 14.5* 12.2* 11.7*  NEUTROABS 16.3*  --   --   --   --   --   HGB 11.1* 12.2 8.9* 7.8* 8.0* 7.8*  HCT 31.2* 36.0 25.1* 22.3* 22.7* 22.4*  MCV 80.8  --  81.0 81.4 81.4 80.6  PLT 324  --  301 261 262 263   CBG:  Recent Labs Lab 05/22/13 1140 05/22/13 1724 05/22/13 2132 05/23/13 0733 05/23/13 1145  GLUCAP 336* 236* 220* 185* 206*    Recent Results (from the past 240 hour(s))  WOUND CULTURE     Status: None   Collection Time    05/17/13  9:35 PM      Result Value Range Status   Specimen Description WOUND LEFT FOOT   Final   Special Requests NONE   Final   Gram Stain     Final   Value: NO WBC SEEN     NO SQUAMOUS EPITHELIAL CELLS SEEN     MODERATE GRAM POSITIVE COCCI IN PAIRS     Performed at [facility]   Culture     Final   Value: ABUNDANT GROUP B STREP(S.AGALACTIAE)ISOLATED     Note: TESTING AGAINST S. AGALACTIAE NOT ROUTINELY PERFORMED DUE TO PREDICTABILITY OF AMP/PEN/VAN SUSCEPTIBILITY.     Performed at [facility]   Report Status 05/20/2013 FINAL   Final  CULTURE, BLOOD (ROUTINE X 2)     Status: None   Collection Time    05/17/13  9:55 PM      Result Value Range Status   Specimen Description BLOOD LEFT HAND   Final   Special Requests BOTTLES DRAWN AEROBIC ONLY 10ML   Final   Culture  Setup Time     Final   Value: 05/18/2013 07:40     Performed at [facility]   Culture     Final   Value:        BLOOD CULTURE RECEIVED NO  GROWTH TO DATE CULTURE WILL BE HELD FOR 5 DAYS BEFORE ISSUING A FINAL NEGATIVE REPORT     Performed at [facility]   Report Status PENDING   Incomplete  CULTURE, BLOOD (ROUTINE X 2)     Status: None   Collection Time    05/17/13  9:55 PM      Result Value Range Status   Specimen Description BLOOD RIGHT ARM   Final   Special Requests BOTTLES DRAWN AEROBIC ONLY 10ML  Final   Culture  Setup Time     Final   Value: 05/18/2013 07:40     Performed at [facility]   Culture     Final   Value:        BLOOD CULTURE RECEIVED NO GROWTH TO DATE CULTURE WILL BE HELD FOR 5 DAYS BEFORE ISSUING A FINAL NEGATIVE REPORT     Performed at [facility]   Report Status PENDING   Incomplete  MRSA PCR SCREENING     Status: None   Collection Time    05/18/13 12:55 AM      Result Value Range Status   MRSA by PCR NEGATIVE  NEGATIVE Final   Comment:            The GeneXpert MRSA Assay (FDA     approved for NASAL specimens     only), is one component of a     comprehensive MRSA colonization     surveillance program. It is not     intended to diagnose MRSA     infection nor to guide or     monitor treatment for     MRSA infections.  SURGICAL PCR SCREEN     Status: None   Collection Time    05/21/13  7:52 AM      Result Value Range Status   MRSA, PCR NEGATIVE  NEGATIVE Final   Staphylococcus aureus NEGATIVE  NEGATIVE Final   Comment:            The Xpert SA Assay (FDA     approved for NASAL specimens     in patients over 13 years of age),     is one component of     a comprehensive surveillance     program.  Test performance has     been validated by [facility] for patients greater     than or equal to 34 year old.     It is not intended     to diagnose infection nor to     guide or monitor treatment.     Studies:  Recent x-ray studies have been reviewed in detail by the Attending Physician  Scheduled Meds:  Scheduled Meds: . carvedilol  6.25 mg Oral BID WC   . ceFEPime (MAXIPIME) IV  2 g Intravenous Q24H  . DULoxetine  60 mg Oral Daily  . feeding supplement (GLUCERNA SHAKE)  237 mL Oral BID BM  . heparin  5,000 Units Subcutaneous Q8H  . insulin aspart  0-9 Units Subcutaneous TID WC  . insulin glargine  10 Units Subcutaneous Daily  . levETIRAcetam  1,000 mg Oral QHS  . multivitamin with minerals  1 tablet Oral Daily  . senna-docusate  1 tablet Oral QHS  . sodium chloride  10-40 mL Intracatheter Q12H  . sodium chloride  3 mL Intravenous Q12H  . vancomycin  750 mg Intravenous Q24H   Continuous Infusions: . sodium chloride 100 mL/hr at 05/21/13 2034  . sodium chloride 20 mL/hr at 05/22/13 [place] [facility] Pager: [phone]   If 7PM-7AM, please contact night-coverage [email or web address] Password [email or web address] 05/23/2013, 12:33 PM   LOS: 6 days

## 2013-05-23 NOTE — Progress Notes (Signed)
PT Cancellation Note  Patient Details Name: [name] MRN: [id number] DOB: 1966/02/15   Cancelled Treatment:     Pt declining therapy at this time despite encouragement.  States she wants to rest.      Sarajane Marek, [place] [phone] 05/23/2013

## 2013-05-23 NOTE — Progress Notes (Signed)
Subjective: 2 Days Post-Op Procedure(s) (LRB): Left Midfoot AMPUTATION (Left) Patient reports pain as moderate.   Yesterday I elevated her foot above heart by flexing knee and trendelenburg bed position. She states foot hurts and asked about more pain meds. Bed reposition AGAIN and she noted immediate relief in pain.   Greater than 50% improvement in pain.  After meals or OOB to BR needs foot Up .   Objective: Vital signs in last 24 hours: Temp:  [98 F (36.7 C)-98.7 F (37.1 C)] 98.7 F (37.1 C) (10/19 [id number]) Pulse Rate:  [86-93] 87 (10/19 0633) Resp:  [16-20] 20 (10/19 [id number]) BP: (133-160)/(76-98) 139/83 mmHg (10/19 0633) SpO2:  [98 %-100 %] 98 % (10/19 [id number])  Intake/Output from previous day: 10/18 0701 - 10/19 0700 In: 900 [P.O.:320; I.V.:380; IV Piggyback:200] Out: -  Intake/Output this shift:     Recent Labs  05/21/13 0520  HGB 7.8*    Recent Labs  05/21/13 0520  WBC 11.7*  RBC 2.78*  HCT 22.4*  PLT 263    Recent Labs  05/21/13 0520  NA 131*  K 3.5  CL 101  CO2 22  BUN 19  CREATININE 1.84*  GLUCOSE 150*  CALCIUM 7.3*   No results found for this basename: LABPT, INR,  in the last 72 hours  dressing dry no drainage.   Assessment/Plan: 2 Days Post-Op Procedure(s) (LRB): Left Midfoot AMPUTATION (Left) On IV ABX.   Stool softener added  [name] C 05/23/2013, 11:14 AM

## 2013-05-24 ENCOUNTER — Encounter (HOSPITAL_COMMUNITY): Payer: Self-pay | Admitting: Orthopedic Surgery

## 2013-05-24 LAB — BASIC METABOLIC PANEL
BUN: 16 mg/dL (ref 6–23)
CO2: 22 mEq/L (ref 19–32)
Calcium: 7.9 mg/dL — ABNORMAL LOW (ref 8.4–10.5)
Creatinine, Ser: 1.52 mg/dL — ABNORMAL HIGH (ref 0.50–1.10)
GFR calc non Af Amer: 40 mL/min — ABNORMAL LOW (ref >90)
Glucose, Bld: 260 mg/dL — ABNORMAL HIGH (ref 70–99)
Sodium: 132 mEq/L — ABNORMAL LOW (ref 135–145)

## 2013-05-24 LAB — CULTURE, BLOOD (ROUTINE X 2)
Culture: NO GROWTH
Culture: NO GROWTH

## 2013-05-24 LAB — GLUCOSE, CAPILLARY
Glucose-Capillary: 214 mg/dL — ABNORMAL HIGH (ref 70–99)
Glucose-Capillary: 172 mg/dL — ABNORMAL HIGH (ref 70–99)

## 2013-05-24 MED ORDER — MAGNESIUM HYDROXIDE 400 MG/5ML PO SUSP
30.0000 mL | Freq: Once | ORAL | Status: AC
  Administered 2013-05-24: 30 mL via ORAL
  Filled 2013-05-24: qty 30

## 2013-05-24 MED ORDER — LEVOFLOXACIN 750 MG PO TABS: 750.0000 mg | ORAL_TABLET | Freq: Every day | ORAL | Status: DC

## 2013-05-24 MED ORDER — GLUCERNA SHAKE PO LIQD: 237.0000 mL | Freq: Two times a day (BID) | ORAL | Status: DC

## 2013-05-24 MED ORDER — SORBITOL 70 % SOLN
960.0000 mL | TOPICAL_OIL | Freq: Once | ORAL | Status: DC
  Filled 2013-05-24: qty 240

## 2013-05-24 MED ORDER — SENNOSIDES-DOCUSATE SODIUM 8.6-50 MG PO TABS: 2.0000 | ORAL_TABLET | Freq: Every day | ORAL | Status: DC

## 2013-05-24 MED ORDER — CARVEDILOL 6.25 MG PO TABS: 6.2500 mg | ORAL_TABLET | Freq: Two times a day (BID) | ORAL | Status: DC

## 2013-05-24 MED ORDER — INSULIN GLARGINE 100 UNIT/ML ~~LOC~~ SOLN: 30.0000 [IU] | Freq: Every day | SUBCUTANEOUS | Status: DC

## 2013-05-24 MED ORDER — POLYETHYLENE GLYCOL 3350 17 G PO PACK: 17.0000 g | PACK | Freq: Every day | ORAL | Status: DC

## 2013-05-24 MED ORDER — OXYCODONE-ACETAMINOPHEN 5-325 MG PO TABS: 1.0000 | ORAL_TABLET | ORAL | Status: DC | PRN

## 2013-05-24 NOTE — Care Management Note (Signed)
    Page 1 of 2   05/24/2013     5:15:45 PM   CARE MANAGEMENT NOTE 05/24/2013  Patient:  MIYU, DEMARZO   Account Number:  [id number]  Date Initiated:  05/18/2013  Documentation initiated by:  Marvetta Gibbons  Subjective/Objective Assessment:   Pt admitted with cellulitis/abcess- will need surgical consult for I+D     Action/Plan:   PTA pt lived at home- NCM to follow pt progress for d/c needs   Anticipated DC Date:  05/24/2013   Anticipated DC Plan:  [place]  CM consult      Choice offered to / List presented to:     DME arranged  [place]  [facility]      DME agency  [place] arranged  [place].   Status of service:  Completed, signed off Medicare Important Message given?   (If response is "NO", the following Medicare IM given date fields will be blank) Date Medicare IM given:   Date Additional Medicare IM given:    Discharge Disposition:  [place]  Per UR Regulation:  Reviewed for med. necessity/level of care/duration of stay  If discussed at [place] of Stay Meetings, dates discussed:    Comments:  05/24/13 17:12 Tomi Bamberger RN, BSN [phone] patient lives with nephew, patient is for dc today, patient chose [facility] for HHPT, [place] and Social Work.   Referral made to [facility], [place] notified.  Soc will begin 24-48 hrs post discharge.  CSW set up transportation for patient.  Patient received crutiches from orthopedic techs while in hospital.

## 2013-05-24 NOTE — Clinical Documentation Improvement (Signed)
THIS DOCUMENT IS NOT A PERMANENT PART OF THE MEDICAL RECORD  Please update your documentation with the medical record to reflect your response to this query. If you need help knowing how to do this please call [phone].  05/24/13  To Dr. Verlee Monte  In a better effort to capture your patient's severity of illness, reflect appropriate length of stay and utilization of resources, a review of the patient medical record has revealed the following indicators.    Based on your clinical judgment, please clarify and document in a progress note and/or discharge summary the clinical condition associated with the following supporting information:  In responding to this query please exercise your independent judgment.  The fact that a query is asked, does not imply that any particular answer is desired or expected.  Medicare rules require specification as to whether an inpatient diagnosis was present at the time of admission.    Noted in H + P patient with WBC 19.6, BP 172/100, P 121 and then developed a temp of 100.8 and 102 within 28 hours of admission patient placed on IV Vancomycin and Cefepime .   Please clarify if the following diagnosis,        Sepsis was:      _ Present at the time of admission  _ NOT present at the time of inpatient admission and it developed during the inpatient stay  _ Unable to clinically determine whether the condition was present on admission.  _  Documentation insufficient to determine if condition was present at the time of inpatient admission  You may use possible, probable, or suspect with inpatient documentation. possible, probable, suspected diagnoses MUST be documented at the time of discharge  Reviewed:  Sepsis secondary to Cellulitis and abscess of left foot  Dc summarry   Thank You,  Ree Kida  Clinical Documentation Specialist: [place]

## 2013-05-24 NOTE — Clinical Social Work Note (Signed)
Patient will be transported home by [employer]. RN notified that patient will need to be at [facility] entrance for [employer] by [place] on 05/24/13. Number for transporting company is [phone] RN will notify patient.   Liz Beach, [place], [place], [id number]

## 2013-05-24 NOTE — Progress Notes (Signed)
Patient ID: [name], female   DOB: Apr 26, 1966, 47 y.o.   MRN: [id number] Patient is sitting out of bed with her foot to tendon. She has been full weightbearing on her foot. Discussed the importance of elevation and nonweightbearing. Patient is stable for discharge at this time from an orthopedic standpoint. Followup in the office in 2 weeks.

## 2013-05-24 NOTE — Progress Notes (Signed)
ANTIBIOTIC CONSULT NOTE - FOLLOW UP  Pharmacy Consult for vancomycin Indication: left foot ulcer infection    Allergies  Allergen Reactions  . Gabapentin Itching and Swelling  . Penicillins Hives    Patient Measurements: Height: 5\' 4"  (162.6 cm) Weight: 172 lb 9.9 oz (78.3 kg) IBW/kg (Calculated) : 54.7  Vital Signs: Temp: 97.8 F (36.6 C) (10/20 1335) Temp src: Oral (10/20 1335) BP: 125/76 mmHg (10/20 1335) Pulse Rate: 80 (10/20 1335) Intake/Output from previous day: 10/19 0701 - 10/20 0700 In: 640 [P.O.:240; I.V.:200; IV Piggyback:200] Out: -  Intake/Output from this shift:    Labs:  Recent Labs  05/24/13 0515  CREATININE 1.52*   Estimated Creatinine Clearance: 46.8 ml/min (by C-G formula based on Cr of 1.52). No results found for this basename: VANCOTROUGH, Corlis Leak, VANCORANDOM, GENTTROUGH, GENTPEAK, GENTRANDOM, TOBRATROUGH, TOBRAPEAK, TOBRARND, AMIKACINPEAK, AMIKACINTROU, AMIKACIN,  in the last 72 hours   Recent Results (from the past 240 hour(s))  WOUND CULTURE     Status: None   Collection Time    05/17/13  9:35 PM      Result Value Range Status   Specimen Description WOUND LEFT FOOT   Final   Special Requests NONE   Final   Gram Stain     Final   Value: NO WBC SEEN     NO SQUAMOUS EPITHELIAL CELLS SEEN     MODERATE GRAM POSITIVE COCCI IN PAIRS     Performed at [facility]   Culture     Final   Value: ABUNDANT GROUP B STREP(S.AGALACTIAE)ISOLATED     Note: TESTING AGAINST S. AGALACTIAE NOT ROUTINELY PERFORMED DUE TO PREDICTABILITY OF AMP/PEN/VAN SUSCEPTIBILITY.     Performed at [facility]   Report Status 05/20/2013 FINAL   Final  CULTURE, BLOOD (ROUTINE X 2)     Status: None   Collection Time    05/17/13  9:55 PM      Result Value Range Status   Specimen Description BLOOD LEFT HAND   Final   Special Requests BOTTLES DRAWN AEROBIC ONLY 10ML   Final   Culture  Setup Time     Final   Value: 05/18/2013 07:40     Performed at  [facility]   Culture     Final   Value: NO GROWTH 5 DAYS     Performed at [facility]   Report Status 05/24/2013 FINAL   Final  CULTURE, BLOOD (ROUTINE X 2)     Status: None   Collection Time    05/17/13  9:55 PM      Result Value Range Status   Specimen Description BLOOD RIGHT ARM   Final   Special Requests BOTTLES DRAWN AEROBIC ONLY 10ML   Final   Culture  Setup Time     Final   Value: 05/18/2013 07:40     Performed at [facility]   Culture     Final   Value: NO GROWTH 5 DAYS     Performed at [facility]   Report Status 05/24/2013 FINAL   Final  MRSA PCR SCREENING     Status: None   Collection Time    05/18/13 12:55 AM      Result Value Range Status   MRSA by PCR NEGATIVE  NEGATIVE Final   Comment:            The GeneXpert MRSA Assay (FDA     approved for NASAL specimens     only), is one  component of a     comprehensive MRSA colonization     surveillance program. It is not     intended to diagnose MRSA     infection nor to guide or     monitor treatment for     MRSA infections.  SURGICAL PCR SCREEN     Status: None   Collection Time    05/21/13  7:52 AM      Result Value Range Status   MRSA, PCR NEGATIVE  NEGATIVE Final   Staphylococcus aureus NEGATIVE  NEGATIVE Final   Comment:            The Xpert SA Assay (FDA     approved for NASAL specimens     in patients over 53 years of age),     is one component of     a comprehensive surveillance     program.  Test performance has     been validated by [employer] for patients greater     than or equal to 22 year old.     It is not intended     to diagnose infection nor to     guide or monitor treatment.    Assessment: 47 yo female with cellulitis and abscess of the left foot on vancomycin and cefepime. Vanc trough 16.2 mcg/ml on 10/17.  Wound cultures growing GBS.  Goal of Therapy:  Vancomycin trough level 10-15 mcg/ml  Plan:  -Continue vancomycin 750 mg IV q 24 hrs  for now.  -Noted plans for patient to discharge today on po Levaquin.  Uvaldo Rising, BCPS  Clinical Pharmacist Pager [phone]  05/24/2013 2:35 PM

## 2013-05-24 NOTE — Discharge Summary (Signed)
Addendum  Patient seen and examined, chart and data base reviewed.  I agree with the above assessment and plan.  For full details please see Mrs. Imogene Burn PA note.  Status post left mid foot amputation, likely to have delayed healing because of low albumin.  Discharge home on Levaquin.  Home health PT/OT/social work ordered, patient will stay with her sister. She and her stay in the hospital because she did not want to be a burden on her sister.  The patient started to hint that she does not want to be rushed out of the hospital like what happened in [place] (she ask an attorney to sue a [place] hospital).   I explained to her that she is medically ready, stay in the hospital more than this is not going to change her outcome as she is going to get antibiotics at home with some services she is getting in the hospital.  I have asked her to concentrate to control her blood sugar, blood pressure and improve her nutritional status to help with her wound healing.  Birdie Hopes, MD [facility] Pager: [phone] 05/24/2013, 11:47 AM

## 2013-05-24 NOTE — Discharge Summary (Signed)
Physician Discharge Summary  [name] [id number] DOB: 24-May-1966 DOA: 05/17/2013  PCP: Maurice Small, D, MD  Admit date: 05/17/2013 Discharge date: 05/24/2013  Time spent: 50 minutes  Recommendations for Outpatient Follow-up:  PCP follow up will be important for DM management, HTN management, seizure disorder and Hep C.  Please check a bmet in follow up to monitor creatinine.  Initially an appointment was scheduled for Ms. [name], but she requested that we cancel the appointment and allow her to reschedule it at a time more convenient for her.  Orthopedic follow up with Dr. Sharol Given for Left Foot care post amputation.  Patient will be discharged with Home health social work (as she complained about her living arrangements), Home health PT, OT, medication assistance for antibiotics, and transportation assistance (if desired).   Discharge Diagnoses:  Principal Problem:   Cellulitis and abscess of foot Active Problems:   DM (diabetes mellitus), type 2, uncontrolled   HTN (hypertension), benign   History of MRSA infection   Seizure disorder   Hypokalemia   Diabetic foot ulcer   Hep C w/o coma, chronic   CKD (chronic kidney disease) stage 3, GFR 30-59 ml/min   Acute kidney injury   Hyponatremia   Refusal of blood transfusions as patient is Jehovah's Witness   Discharge Condition: stable, much improved.  Diet recommendation: carb modified.  Filed Weights   05/17/13 1958 05/17/13 2312  Weight: 74.844 kg (165 lb) 78.3 kg (172 lb 9.9 oz)    History of present illness:  47 yo female with history of Hep C, DM II, CKD, Seizure disorder presented to the Emergency Department septic from a left foot wound.  Per patient history she had a nail in the bottom of her foot for 2 weeks but did not feel it.  Hospital Course:   Sepsis secondary to Cellulitis and abscess of left foot  - Initially with fever 102, tachycardia, elevated WBC  - Blood cx show no growth. - Wound culture showed  group B strep  - h/o MRSA- treated with Vanc and Cefepime as an inpatient.  Will be discharged   on Levaquin as she has a penicillin allergy.  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  - Follow up with Dr. Sharol Given in 2 weeks.  Patient is to be non weight bearing.  DM type 2 (diabetes mellitus, type 2)  - last A1c in 7/14 was 9.2  - likely non-compliant with insulin given A1C  - currently on lantus and SSI  - diabetic Coordinator for education.  - Have requested PCP follow up for DM management  AKI  -Secondary to infection, improved with IV fluids.  Lisinopril has been discontinued. -Discussed with the patient, patient mentioned that she she was about to be on dialysis about 5 years ago but her kidney function improved.  -baseline creatinine in July of 2014 was 1.1  Hyponatremia  -Patient does have history of cirrhosis, cannot rule out chronic liver disease.  -Patient is on IV fluids, started to develop anasarca with third spacing secondary to low albumin.  -Sodium 132 on discharge.  HTN (hypertension), benign  - added Coreg back this admission - she was on this in the past but it was stopped on the last admission due to hypotension  - Lisinopril discontinued due to elevated creatinine.   Seizure disorder  -Stable.  -cont Keppra   Hypokalemia  - replaced   History of MRSA abscesses  - has been treated on and  off with Doxy- last prescribed on 9/19 by [facility]- pt did not f/u after this appt  - No signs of MRSA abscesses currently.  Hep C w/o coma, chronic  -LFTs stable  -No encephalopathy  -No volume overload.   Low albumin (1.1)  -total protein 5.3 - 6.9  -Nutrition consultation.  -This is likely secondary to chronic liver disease   Jehovah's Witness  -Patient is Jehovah's Witness and she refused any blood products transfusion.  -As patient is anemic, minimize blood draws.  Social -At the time of discharge, the patient wanted to  remain in the hospital as she did not want to be a burden to her sister; she was concerned her nephew's home was not clean enough for her; she had difficulties with transportation; she felt she deserved to stay in the hospital.  She requested transfer to a skilled rehab facility.  Physical therapy evaluation indicated that she did not need skilled rehab. -It was explained that she was medically ready for discharge.  Home health PT, OT, Social Work was ordered.  Medication and transportation assistance was offered by Case Management and Social Work.  Procedures:  S/P mid foot amputation 10/17  Consultations:  Orthopedic Surgery  Discharge Exam: Filed Vitals:   05/24/13 0535  BP: 146/71  Pulse: 88  Temp:   Resp: 18   General: A&O x 3, animated, NAD, sitting up in bed, eating breakfast. Lungs: Clear to auscultation bilaterally without wheezes or crackles  Cardiovascular: Regular rate and rhythm without murmur gallop or rub normal S1 and S2  Abdomen: Nontender, nondistended, soft, bowel sounds positive, no rebound, no ascites, no appreciable mass  Extremities: Distal LLE well wrapped in post surgical dressing. Skin: Multiple annular scars noted on lower extremities    Discharge Instructions      Discharge Orders   Future Orders Complete By Expires   Touch down weight bearing  As directed    Questions:     Laterality:  left   Extremity:  Lower       Medication List    STOP taking these medications       doxycycline 100 MG tablet  Commonly known as:  VIBRA-TABS     lisinopril 40 MG tablet  Commonly known as:  PRINIVIL,ZESTRIL      TAKE these medications       carvedilol 6.25 MG tablet  Commonly known as:  COREG  Take 1 tablet (6.25 mg total) by mouth 2 (two) times daily with a meal.     DULoxetine 60 MG capsule  Commonly known as:  CYMBALTA  Take 60 mg by mouth daily.     feeding supplement (GLUCERNA SHAKE) Liqd  Take 237 mLs by mouth 2 (two) times daily between  meals.     insulin aspart 100 UNIT/ML injection  Commonly known as:  novoLOG  Inject 2-20 Units into the skin 3 (three) times daily before meals. Sliding scale     insulin glargine 100 UNIT/ML injection  Commonly known as:  LANTUS  Inject 0.3 mLs (30 Units total) into the skin at bedtime.     levETIRAcetam 1000 MG tablet  Commonly known as:  KEPPRA  Take 1,000 mg by mouth at bedtime.     levofloxacin 750 MG tablet  Commonly known as:  LEVAQUIN  Take 1 tablet (750 mg total) by mouth daily.     MAGNESIUM PO  Take 1 tablet by mouth daily.     oxyCODONE-acetaminophen 5-325 MG per tablet  Commonly  known as:  PERCOCET/ROXICET  Take 1-2 tablets by mouth every 4 (four) hours as needed.     polyethylene glycol packet  Commonly known as:  MIRALAX / GLYCOLAX  Take 17 g by mouth daily.     senna-docusate 8.6-50 MG per tablet  Commonly known as:  Senokot-S  Take 2 tablets by mouth at bedtime.     vitamin C 500 MG tablet  Commonly known as:  ASCORBIC ACID  Take 500 mg by mouth daily.     zolpidem 6.25 MG CR tablet  Commonly known as:  AMBIEN CR  Take 6.25 mg by mouth at bedtime as needed for sleep.       Allergies  Allergen Reactions  . Gabapentin Itching and Swelling  . Penicillins Hives   Follow-up Information   Follow up with WEBB, CAROL, D, MD. Schedule an appointment as soon as possible for a visit in 1 week. (Primary Care Physican for hospital follow up )    Specialty:  Family Medicine   Contact information:   [address] [place] [address] [phone]       Follow up with Newt Minion, MD. Schedule an appointment as soon as possible for a visit in 2 weeks. (Foot follow up.)    Specialty:  Orthopedic Surgery   Contact information:   [place] [address] [phone]        The results of significant diagnostics from this hospitalization (including imaging, microbiology, ancillary and laboratory) are listed below  for reference.    Significant Diagnostic Studies: Dg Chest 2 View  05/17/2013   CLINICAL DATA:  hypertension and dyspnea on exertion  EXAM: CHEST  2 VIEW  COMPARISON:  February 09, 2013  FINDINGS: The lungs are clear. Heart size and pulmonary vascularity are normal. No adenopathy. No bone lesions. There are small cervical ribs bilaterally.  IMPRESSION: No edema or consolidation. Small cervical ribs bilaterally.   Electronically Signed   By: Lowella Grip M.D.   On: 05/17/2013 21:23   Mr Foot Left Wo Contrast  05/19/2013   CLINICAL DATA:  Cellulitis with soft tissue ulcerations of the mid foot on the dorsal and plantar aspects between the 3rd and 4th rays.  EXAM: MRI OF THE LEFT FOREFOOT WITHOUT CONTRAST  TECHNIQUE: Multiplanar, multisequence MR imaging was performed. No intravenous contrast was administered.  COMPARISON:  Radiographs dated 05/17/2013  FINDINGS: There is a osteomyelitis of the proximal phalanx of the 4th toe with some destruction of the medial cortex of the midshaft. There is also effusion at the 4th metatarsal phalangeal joint consistent with a septic joint.  The soft tissue ulcers are noted on the dorsal and plantar aspects of the foot at approximately the level of the metatarsal phalangeal joints between the 3rd and 4th rays. There are poorly defined fluid collections in the soft tissues deep to these ulcerations. The dorsal pus collection extends approximately 2 cm proximal to the 4th metatarsal phalangeal joint. There is no more proximal extension of pus in the foot. There is diffuse subcutaneous edema primarily on the dorsum of the foot.  The tendons and muscle structures demonstrate no acute abnormalities.  IMPRESSION: 1. 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 2. Cellulitis of the forefoot primarily around the 3rd and 4th metatarsal phalangeal joints. Small poorly defined pus collections deep to the dorsal and plantar ulcers. No proximal soft tissue abscesses.    Electronically Signed   By: Vanita Ingles.D.  On: 05/19/2013 11:01   Dg Abd 2 Views  05/21/2013   CLINICAL DATA:  Abdominal pain with vomiting today.  EXAM: ABDOMEN - 2 VIEW  COMPARISON:  None.  FINDINGS: The bowel gas pattern is normal. There is no evidence of free air. No radio-opaque calculi or other significant radiographic abnormality is seen.  IMPRESSION: Negative.   Electronically Signed   By: Rolla Flatten M.D.   On: 05/21/2013 01:16   Dg Foot Complete Left  05/17/2013   CLINICAL DATA:  Left foot pain and swelling. Plantar ulcer. Diabetes.  EXAM: LEFT FOOT - COMPLETE 3+ VIEW  COMPARISON:  None.  FINDINGS: There is no evidence of fracture or dislocation. There is no evidence of arthropathy or other focal bone abnormality. No evidence of periostitis or osteolysis. No evidence of soft tissue gas. Small plantar calcaneal bone spur noted as well as mild peripheral vascular calcification.  IMPRESSION: No acute findings.  Small plantar calcaneal bone spur and mild peripheral vascular calcification.   Electronically Signed   By: Earle Gell M.D.   On: 05/17/2013 21:23    Microbiology: Recent Results (from the past 240 hour(s))  WOUND CULTURE     Status: None   Collection Time    05/17/13  9:35 PM      Result Value Range Status   Specimen Description WOUND LEFT FOOT   Final   Special Requests NONE   Final   Gram Stain     Final   Value: NO WBC SEEN     NO SQUAMOUS EPITHELIAL CELLS SEEN     MODERATE GRAM POSITIVE COCCI IN PAIRS     Performed at [facility]   Culture     Final   Value: ABUNDANT GROUP B STREP(S.AGALACTIAE)ISOLATED     Note: TESTING AGAINST S. AGALACTIAE NOT ROUTINELY PERFORMED DUE TO PREDICTABILITY OF AMP/PEN/VAN SUSCEPTIBILITY.     Performed at [facility]   Report Status 05/20/2013 FINAL   Final  CULTURE, BLOOD (ROUTINE X 2)     Status: None   Collection Time    05/17/13  9:55 PM      Result Value Range Status   Specimen Description BLOOD LEFT HAND    Final   Special Requests BOTTLES DRAWN AEROBIC ONLY 10ML   Final   Culture  Setup Time     Final   Value: 05/18/2013 07:40     Performed at [facility]   Culture     Final   Value: NO GROWTH 5 DAYS     Performed at [facility]   Report Status 05/24/2013 FINAL   Final  CULTURE, BLOOD (ROUTINE X 2)     Status: None   Collection Time    05/17/13  9:55 PM      Result Value Range Status   Specimen Description BLOOD RIGHT ARM   Final   Special Requests BOTTLES DRAWN AEROBIC ONLY 10ML   Final   Culture  Setup Time     Final   Value: 05/18/2013 07:40     Performed at [facility]   Culture     Final   Value: NO GROWTH 5 DAYS     Performed at [facility]   Report Status 05/24/2013 FINAL   Final  MRSA PCR SCREENING     Status: None   Collection Time    05/18/13 12:55 AM      Result Value Range Status   MRSA by PCR NEGATIVE  NEGATIVE  Final   Comment:            The GeneXpert MRSA Assay (FDA     approved for NASAL specimens     only), is one component of a     comprehensive MRSA colonization     surveillance program. It is not     intended to diagnose MRSA     infection nor to guide or     monitor treatment for     MRSA infections.  SURGICAL PCR SCREEN     Status: None   Collection Time    05/21/13  7:52 AM      Result Value Range Status   MRSA, PCR NEGATIVE  NEGATIVE Final   Staphylococcus aureus NEGATIVE  NEGATIVE Final   Comment:            The Xpert SA Assay (FDA     approved for NASAL specimens     in patients over 34 years of age),     is one component of     a comprehensive surveillance     program.  Test performance has     been validated by [employer] for patients greater     than or equal to 24 year old.     It is not intended     to diagnose infection nor to     guide or monitor treatment.     Labs: Basic Metabolic Panel:  Recent Labs Lab 05/18/13 0545 05/19/13 0500 05/20/13 0628 05/21/13 0520 05/24/13 0515  NA  128* 125* 127* 131* 132*  K 4.0 3.4* 3.7 3.5 4.1  CL 98 98 98 101 101  CO2 21 19 20 22 22   GLUCOSE 227* 196* 337* 150* 260*  BUN 11 18 25* 19 16  CREATININE 1.52* 2.06* 2.34* 1.84* 1.52*  CALCIUM 6.9* 6.8* 7.1* 7.3* 7.9*   Liver Function Tests:  Recent Labs Lab 05/17/13 2003 05/19/13 0500 05/20/13 0628 05/21/13 0520  AST 23 16 17 20   ALT 20 13 14 14   ALKPHOS 112 82 123* 122*  BILITOT 0.5 0.2* 0.2* 0.2*  PROT 6.9 5.3* 5.8* 5.6*  ALBUMIN 1.6* 1.1* 1.2* 1.1*   CBC:  Recent Labs Lab 05/17/13 2003 05/17/13 2015 05/18/13 0545 05/19/13 0500 05/20/13 0628 05/21/13 0520  WBC 19.6*  --  19.3* 14.5* 12.2* 11.7*  NEUTROABS 16.3*  --   --   --   --   --   HGB 11.1* 12.2 8.9* 7.8* 8.0* 7.8*  HCT 31.2* 36.0 25.1* 22.3* 22.7* 22.4*  MCV 80.8  --  81.0 81.4 81.4 80.6  PLT 324  --  301 261 262 263   CBG:  Recent Labs Lab 05/23/13 0733 05/23/13 1145 05/23/13 1718 05/23/13 2110 05/24/13 0740  GLUCAP 185* 206* 103* 111* 258*      Signed:  Karen Kitchens [id number]  [facility] 05/24/2013, 10:42 AM

## 2013-05-24 NOTE — Progress Notes (Signed)
NURSING PROGRESS NOTE  [name] [id number] Discharge Data: 05/24/2013 4:41 PM Attending Provider: Verlee Monte, MD [id number], Valla Leaver, MD     [name] to be D/C'd Home  per MD order.    All IV's discontinued with no bleeding noted.  All belongings returned to patient for patient to take home.   Last Vital Signs:  Blood pressure 125/76, pulse 80, temperature 97.8 F (36.6 C), temperature source Oral, resp. rate 18, height 5\' 4"  (1.626 m), weight 78.3 kg (172 lb 9.9 oz), SpO2 95.00%.  Discharge Medication List   Medication List    STOP taking these medications       doxycycline 100 MG tablet  Commonly known as:  VIBRA-TABS     lisinopril 40 MG tablet  Commonly known as:  PRINIVIL,ZESTRIL      TAKE these medications       carvedilol 6.25 MG tablet  Commonly known as:  COREG  Take 1 tablet (6.25 mg total) by mouth 2 (two) times daily with a meal.     DULoxetine 60 MG capsule  Commonly known as:  CYMBALTA  Take 60 mg by mouth daily.     feeding supplement (GLUCERNA SHAKE) Liqd  Take 237 mLs by mouth 2 (two) times daily between meals.     insulin aspart 100 UNIT/ML injection  Commonly known as:  novoLOG  Inject 2-20 Units into the skin 3 (three) times daily before meals. Sliding scale     insulin glargine 100 UNIT/ML injection  Commonly known as:  LANTUS  Inject 0.3 mLs (30 Units total) into the skin at bedtime.     levETIRAcetam 1000 MG tablet  Commonly known as:  KEPPRA  Take 1,000 mg by mouth at bedtime.     levofloxacin 750 MG tablet  Commonly known as:  LEVAQUIN  Take 1 tablet (750 mg total) by mouth daily.     MAGNESIUM PO  Take 1 tablet by mouth daily.     oxyCODONE-acetaminophen 5-325 MG per tablet  Commonly known as:  PERCOCET/ROXICET  Take 1-2 tablets by mouth every 4 (four) hours as needed.     polyethylene glycol packet  Commonly known as:  MIRALAX / GLYCOLAX  Take 17 g by mouth daily.     senna-docusate 8.6-50 MG per tablet   Commonly known as:  Senokot-S  Take 2 tablets by mouth at bedtime.     vitamin C 500 MG tablet  Commonly known as:  ASCORBIC ACID  Take 500 mg by mouth daily.     zolpidem 6.25 MG CR tablet  Commonly known as:  AMBIEN CR  Take 6.25 mg by mouth at bedtime as needed for sleep.        Joslyn Hy, MSN, RN, [employer]

## 2013-05-24 NOTE — Progress Notes (Signed)
Inpatient Diabetes Program Recommendations  AACE/ADA: New Consensus Statement on Inpatient Glycemic Control (2013)  Target Ranges:  Prepandial:   less than 140 mg/dL      Peak postprandial:   less than 180 mg/dL (1-2 hours)      Critically ill patients:  140 - 180 mg/dL   Reason for Visit: Results for [name], [name] (MRN [id number]) as of 05/24/2013 14:31  Ref. Range 05/23/2013 17:18 05/23/2013 21:10 05/24/2013 05:15 05/24/2013 07:40 05/24/2013 12:37  Glucose-Capillary Latest Range: 70-99 mg/dL 103 (H) 111 (H)  258 (H) 214 (H)   Spoke briefly with patient regarding insulin/diabetes.  She will follow-up with Dr. Justin Mend after discharge.  She has insulin and glucose meter at home.   Adah Perl, RN, BC-ADM Inpatient Diabetes Coordinator Pager [phone]

## 2013-05-24 NOTE — Progress Notes (Signed)
Orthopedic Tech Progress Note Patient Details:  [name] 1966-03-16 [id number]  Ortho Devices Type of Ortho Device: Crutches Ortho Device/Splint Interventions: Application  Viewed order from rn order list Hildred Priest 05/24/2013, 3:35 PM

## 2013-06-01 ENCOUNTER — Emergency Department (HOSPITAL_COMMUNITY): Payer: Medicare Other

## 2013-06-01 ENCOUNTER — Encounter (HOSPITAL_COMMUNITY): Payer: Self-pay | Admitting: Emergency Medicine

## 2013-06-01 ENCOUNTER — Inpatient Hospital Stay (HOSPITAL_COMMUNITY)
Admission: EM | Admit: 2013-06-01 | Discharge: 2013-06-04 | DRG: 683 | Disposition: A | Discharge status: Home / Self Care | Payer: Medicare Other | Attending: Internal Medicine | Admitting: Internal Medicine

## 2013-06-01 DIAGNOSIS — E11621 Type 2 diabetes mellitus with foot ulcer: Secondary | ICD-10-CM

## 2013-06-01 DIAGNOSIS — A599 Trichomoniasis, unspecified: Secondary | ICD-10-CM

## 2013-06-01 DIAGNOSIS — T8789 Other complications of amputation stump: Secondary | ICD-10-CM | POA: Diagnosis present

## 2013-06-01 DIAGNOSIS — Z87891 Personal history of nicotine dependence: Secondary | ICD-10-CM

## 2013-06-01 DIAGNOSIS — E1165 Type 2 diabetes mellitus with hyperglycemia: Secondary | ICD-10-CM

## 2013-06-01 DIAGNOSIS — I129 Hypertensive chronic kidney disease with stage 1 through stage 4 chronic kidney disease, or unspecified chronic kidney disease: Principal | ICD-10-CM | POA: Diagnosis present

## 2013-06-01 DIAGNOSIS — R609 Edema, unspecified: Secondary | ICD-10-CM | POA: Diagnosis present

## 2013-06-01 DIAGNOSIS — E860 Dehydration: Secondary | ICD-10-CM

## 2013-06-01 DIAGNOSIS — I509 Heart failure, unspecified: Secondary | ICD-10-CM | POA: Diagnosis present

## 2013-06-01 DIAGNOSIS — B182 Chronic viral hepatitis C: Secondary | ICD-10-CM

## 2013-06-01 DIAGNOSIS — I5030 Unspecified diastolic (congestive) heart failure: Secondary | ICD-10-CM | POA: Diagnosis present

## 2013-06-01 DIAGNOSIS — Y838 Other surgical procedures as the cause of abnormal reaction of the patient, or of later complication, without mention of misadventure at the time of the procedure: Secondary | ICD-10-CM | POA: Diagnosis present

## 2013-06-01 DIAGNOSIS — N185 Chronic kidney disease, stage 5: Secondary | ICD-10-CM | POA: Diagnosis present

## 2013-06-01 DIAGNOSIS — H409 Unspecified glaucoma: Secondary | ICD-10-CM | POA: Diagnosis present

## 2013-06-01 DIAGNOSIS — N390 Urinary tract infection, site not specified: Secondary | ICD-10-CM

## 2013-06-01 DIAGNOSIS — I798 Other disorders of arteries, arterioles and capillaries in diseases classified elsewhere: Secondary | ICD-10-CM | POA: Diagnosis present

## 2013-06-01 DIAGNOSIS — Z794 Long term (current) use of insulin: Secondary | ICD-10-CM

## 2013-06-01 DIAGNOSIS — I10 Essential (primary) hypertension: Secondary | ICD-10-CM | POA: Diagnosis present

## 2013-06-01 DIAGNOSIS — G40909 Epilepsy, unspecified, not intractable, without status epilepticus: Secondary | ICD-10-CM | POA: Diagnosis present

## 2013-06-01 DIAGNOSIS — D649 Anemia, unspecified: Secondary | ICD-10-CM

## 2013-06-01 DIAGNOSIS — E871 Hypo-osmolality and hyponatremia: Secondary | ICD-10-CM

## 2013-06-01 DIAGNOSIS — F319 Bipolar disorder, unspecified: Secondary | ICD-10-CM | POA: Diagnosis present

## 2013-06-01 DIAGNOSIS — D638 Anemia in other chronic diseases classified elsewhere: Secondary | ICD-10-CM | POA: Diagnosis present

## 2013-06-01 DIAGNOSIS — Z531 Procedure and treatment not carried out because of patient's decision for reasons of belief and group pressure: Secondary | ICD-10-CM

## 2013-06-01 DIAGNOSIS — E876 Hypokalemia: Secondary | ICD-10-CM

## 2013-06-01 DIAGNOSIS — Z8614 Personal history of Methicillin resistant Staphylococcus aureus infection: Secondary | ICD-10-CM

## 2013-06-01 DIAGNOSIS — N179 Acute kidney failure, unspecified: Secondary | ICD-10-CM | POA: Diagnosis present

## 2013-06-01 DIAGNOSIS — G589 Mononeuropathy, unspecified: Secondary | ICD-10-CM | POA: Diagnosis present

## 2013-06-01 DIAGNOSIS — L02619 Cutaneous abscess of unspecified foot: Secondary | ICD-10-CM

## 2013-06-01 DIAGNOSIS — R079 Chest pain, unspecified: Secondary | ICD-10-CM | POA: Diagnosis present

## 2013-06-01 DIAGNOSIS — I951 Orthostatic hypotension: Secondary | ICD-10-CM

## 2013-06-01 DIAGNOSIS — R55 Syncope and collapse: Secondary | ICD-10-CM

## 2013-06-01 DIAGNOSIS — N183 Chronic kidney disease, stage 3 unspecified: Secondary | ICD-10-CM | POA: Diagnosis present

## 2013-06-01 DIAGNOSIS — E1159 Type 2 diabetes mellitus with other circulatory complications: Secondary | ICD-10-CM | POA: Diagnosis present

## 2013-06-01 DIAGNOSIS — E119 Type 2 diabetes mellitus without complications: Secondary | ICD-10-CM

## 2013-06-01 HISTORY — DX: Essential (primary) hypertension: I10

## 2013-06-01 HISTORY — DX: Transient cerebral ischemic attack, unspecified: G45.9

## 2013-06-01 HISTORY — DX: Reserved for inherently not codable concepts without codable children: IMO0001

## 2013-06-01 HISTORY — DX: Procedure and treatment not carried out because of patient's decision for reasons of belief and group pressure: Z53.1

## 2013-06-01 LAB — POCT I-STAT TROPONIN I: Troponin i, poc: 0 ng/mL (ref 0.00–0.08)

## 2013-06-01 LAB — BASIC METABOLIC PANEL
Calcium: 8.4 mg/dL (ref 8.4–10.5)
Chloride: 98 mEq/L (ref 96–112)
GFR calc Af Amer: 39 mL/min — ABNORMAL LOW (ref >90)
GFR calc non Af Amer: 34 mL/min — ABNORMAL LOW (ref >90)
Sodium: 134 mEq/L — ABNORMAL LOW (ref 135–145)

## 2013-06-01 LAB — CBC
MCH: 28.2 pg (ref 26.0–34.0)
MCHC: 33.2 g/dL (ref 30.0–36.0)
Platelets: 320 10*3/uL (ref 150–400)
RDW: 15 % (ref 11.5–15.5)

## 2013-06-01 LAB — PRO B NATRIURETIC PEPTIDE: Pro B Natriuretic peptide (BNP): 1210 pg/mL — ABNORMAL HIGH (ref 0–125)

## 2013-06-01 LAB — HEPATIC FUNCTION PANEL
AST: 27 U/L (ref 0–37)
Bilirubin, Direct: <0.1 mg/dL (ref 0.0–0.3)
Total Bilirubin: 0.2 mg/dL — ABNORMAL LOW (ref 0.3–1.2)

## 2013-06-01 MED ORDER — LABETALOL HCL 5 MG/ML IV SOLN
20.0000 mg | Freq: Once | INTRAVENOUS | Status: AC
  Administered 2013-06-01: 20 mg via INTRAVENOUS
  Filled 2013-06-01: qty 4

## 2013-06-01 MED ORDER — CLONIDINE HCL 0.2 MG PO TABS
0.2000 mg | ORAL_TABLET | Freq: Once | ORAL | Status: AC
  Administered 2013-06-01: 0.2 mg via ORAL
  Filled 2013-06-01: qty 1

## 2013-06-01 MED ORDER — ASPIRIN 325 MG PO TABS
325.0000 mg | ORAL_TABLET | ORAL | Status: AC
  Administered 2013-06-01: 325 mg via ORAL
  Filled 2013-06-01: qty 1

## 2013-06-01 MED ORDER — TECHNETIUM TC 99M DIETHYLENETRIAME-PENTAACETIC ACID: 40.0000 | Freq: Once | INTRAVENOUS | Status: AC | PRN

## 2013-06-01 MED ORDER — FUROSEMIDE 10 MG/ML IJ SOLN
60.0000 mg | Freq: Once | INTRAMUSCULAR | Status: AC
  Administered 2013-06-01: 60 mg via INTRAVENOUS
  Filled 2013-06-01: qty 6

## 2013-06-01 MED ORDER — TECHNETIUM TO 99M ALBUMIN AGGREGATED
6.0000 | Freq: Once | INTRAVENOUS | Status: AC | PRN
Start: 2013-06-01 — End: 2013-06-01
  Administered 2013-06-01: 6 via INTRAVENOUS

## 2013-06-01 NOTE — ED Provider Notes (Signed)
Pt received from Dr. Alvino Chapel.  Pt admitted for L foot cellulitis/abscess 10/13-10/20 and underwent mid-foot amputation.  Returns to ED today w/ CP/SOB.  She is very hypertensive, has signs of fluid overload, and also has multiple RF for ACS.  Labs at baseline w/ exception of elevated BNP.   VQ scan no consistent w/ PE.  All results discussed w/ patient. [facility].  IV labetolol and lasix ordered.  11:18 PM   Remer Macho, PA-C 06/01/13 [phone]

## 2013-06-01 NOTE — ED Provider Notes (Signed)
CSN: [id number]     Arrival date & time 06/01/13  1556 History   First MD Initiated Contact with Patient 06/01/13 1718     Chief Complaint  Patient presents with  . Chest Pain  . Post-op Problem   (Consider location/radiation/quality/duration/timing/severity/associated sxs/prior Treatment) Patient is a 47 y.o. female presenting with chest pain. The history is provided by the patient.  Chest Pain Associated symptoms: shortness of breath   Associated symptoms: no abdominal pain, no back pain, no headache, no nausea, no numbness, not vomiting and no weakness    patient presents with mild diffuse chest pain. She was in the hospital last week for partial amputation of her left foot. She states she developed some dull diffuse chest pain today that has been over for stress. She's had some shortness of breath with it. She states she's and 40 pounds. She states she was sent home too early. She states she has not been able to do well at home because no one told her how to manage with only half of the foot. No fevers. She's had occasional cough. There's been some drainage to her foot. She states she needs to sue the black doctor that she saw in the hospital.  Past Medical History  Diagnosis Date  . Diabetes mellitus   . Hepatitis C virus   . Neuropathy   . Glaucoma   . Bipolar 1 disorder   . CHF (congestive heart failure)    Past Surgical History  Procedure Laterality Date  . Abdominal hysterectomy    . Amputation Left 05/21/2013    Procedure: Left Midfoot AMPUTATION;  Surgeon: Newt Minion, MD;  Location: [facility];  Service: Orthopedics;  Laterality: Left;  Left Midfoot Amputation   History reviewed. No pertinent family history. History  Substance Use Topics  . Smoking status: Former Research scientist (life sciences)  . Smokeless tobacco: Never Used  . Alcohol Use: No   OB History   Grav Para Term Preterm Abortions TAB SAB Ect Mult Living                 Review of Systems  Constitutional: Positive for appetite  change. Negative for activity change.  Eyes: Negative for pain.  Respiratory: Positive for shortness of breath. Negative for chest tightness.   Cardiovascular: Positive for chest pain and leg swelling.  Gastrointestinal: Negative for nausea, vomiting, abdominal pain and diarrhea.  Genitourinary: Negative for flank pain.  Musculoskeletal: Negative for back pain and neck stiffness.  Skin: Positive for wound. Negative for rash.  Neurological: Negative for weakness, numbness and headaches.  Psychiatric/Behavioral: Negative for behavioral problems.    Allergies  Gabapentin and Penicillins  Home Medications   Current Outpatient Rx  Name  Route  Sig  Dispense  Refill  . carvedilol (COREG) 6.25 MG tablet   Oral   Take 1 tablet (6.25 mg total) by mouth 2 (two) times daily with a meal.   60 tablet   3   . DULoxetine (CYMBALTA) 60 MG capsule   Oral   Take 60 mg by mouth daily.          . feeding supplement, GLUCERNA SHAKE, (GLUCERNA SHAKE) LIQD   Oral   Take 237 mLs by mouth 2 (two) times daily between meals.   60 Can   6   . insulin aspart (NOVOLOG) 100 UNIT/ML injection   Subcutaneous   Inject 2-20 Units into the skin 3 (three) times daily before meals. Sliding scale         .  insulin glargine (LANTUS) 100 UNIT/ML injection   Subcutaneous   Inject 0.3 mLs (30 Units total) into the skin at bedtime.   10 mL   12   . levETIRAcetam (KEPPRA) 1000 MG tablet   Oral   Take 1,000 mg by mouth at bedtime.          Marland Kitchen levofloxacin (LEVAQUIN) 750 MG tablet   Oral   Take 1 tablet (750 mg total) by mouth daily.   14 tablet   0   . MAGNESIUM PO   Oral   Take 1 tablet by mouth daily.         Marland Kitchen oxyCODONE-acetaminophen (PERCOCET/ROXICET) 5-325 MG per tablet   Oral   Take 1-2 tablets by mouth every 4 (four) hours as needed.   45 tablet   0   . polyethylene glycol (MIRALAX / GLYCOLAX) packet   Oral   Take 17 g by mouth daily.   14 each   0   . senna-docusate  (SENOKOT-S) 8.6-50 MG per tablet   Oral   Take 2 tablets by mouth at bedtime.         . vitamin C (ASCORBIC ACID) 500 MG tablet   Oral   Take 500 mg by mouth daily.         Marland Kitchen zolpidem (AMBIEN CR) 6.25 MG CR tablet   Oral   Take 6.25 mg by mouth at bedtime as needed for sleep.          BP 189/103  Pulse 96  Temp(Src) 98.6 F (37 C) (Oral)  Resp 19  Wt 198 lb (89.812 kg)  BMI 33.97 kg/m2  SpO2 100% Physical Exam  Nursing note and vitals reviewed. Constitutional: She is oriented to person, place, and time. She appears well-developed and well-nourished.  HENT:  Head: Normocephalic and atraumatic.  Eyes: EOM are normal. Pupils are equal, round, and reactive to light.  Neck: Normal range of motion. Neck supple.  Cardiovascular: Normal rate, regular rhythm and normal heart sounds.   No murmur heard. Hypertension  Pulmonary/Chest: Effort normal and breath sounds normal. No respiratory distress. She has no wheezes. She has no rales.  Abdominal: Soft. Bowel sounds are normal. She exhibits no distension. There is no tenderness. There is no rebound and no guarding.  Musculoskeletal: Normal range of motion. She exhibits edema.  Moderate bilateral lower extremity pitting edema. Left midfoot amputation. Mild serosanguineous drainage. Tenderness.  Neurological: She is alert and oriented to person, place, and time. No cranial nerve deficit.  Skin: Skin is warm and dry.  Psychiatric: She has a normal mood and affect. Her speech is normal.    ED Course  Procedures (including critical care time) Labs Review Labs Reviewed  CBC - Abnormal; Notable for the following:    RBC 2.66 (*)    Hemoglobin 7.5 (*)    HCT 22.6 (*)    All other components within normal limits  BASIC METABOLIC PANEL - Abnormal; Notable for the following:    Sodium 134 (*)    Glucose, Bld 303 (*)    Creatinine, Ser 1.75 (*)    GFR calc non Af Amer 34 (*)    GFR calc Af Amer 39 (*)    All other components  within normal limits  PRO B NATRIURETIC PEPTIDE - Abnormal; Notable for the following:    Pro B Natriuretic peptide (BNP) 1210.0 (*)    All other components within normal limits  HEPATIC FUNCTION PANEL - Abnormal; Notable for the following:  Albumin 1.8 (*)    Alkaline Phosphatase 121 (*)    Total Bilirubin 0.2 (*)    All other components within normal limits  POCT I-STAT TROPONIN I   Imaging Review Dg Chest 2 View  06/01/2013   CLINICAL DATA:  Chest pain  EXAM: CHEST  2 VIEW  COMPARISON:  05/17/2013  FINDINGS: Cardiomediastinal silhouette is stable. No pulmonary edema. There is small right pleural effusion with right basilar atelectasis or infiltrate. Bony thorax is unremarkable.  IMPRESSION: No pulmonary edema. Small right pleural effusion with right basilar atelectasis or infiltrate.   Electronically Signed   By: Lahoma Crocker M.D.   On: 06/01/2013 17:10    EKG Interpretation     Ventricular Rate:  95 PR Interval:  152 QRS Duration: 82 QT Interval:  366 QTC Calculation: 459 R Axis:   67 Text Interpretation:  Normal sinus rhythm Normal ECG            MDM   1. Hypertension   2. Peripheral edema    Patient with hypertension and peripheral edema after foot amputation. Also set some chest pain and shortness of breath. EKG reassuring. Troponin negative. Has elevated BNP. Creatinine has increased mildly. X-ray does not show florid CHF. Will get VQ scan to evaluate for pulmonary embolism. Will likely require admission    Jasper Riling. Alvino Chapel, MD 06/01/13 2052

## 2013-06-01 NOTE — ED Notes (Signed)
Presents with left sided chest and SOB worse with exertion and lying flat and deep inspiration that began last night, chest pain is intermittent described as dull. "like my heart is swelling up and is too little to be in the space it is in" reports 40 lb weight gain over the last week and bilateral lower extremitiy swelling. Discharge last week from here post left foot amputation. Dehisced stump with seroussanguinous drainage, no odor. Left leg +1 pitting, bilateral breath sounds clear. SOB worse when lying down.

## 2013-06-02 ENCOUNTER — Encounter (HOSPITAL_COMMUNITY): Payer: Self-pay | Admitting: Internal Medicine

## 2013-06-02 ENCOUNTER — Observation Stay (HOSPITAL_COMMUNITY): Payer: Medicare Other

## 2013-06-02 DIAGNOSIS — E1159 Type 2 diabetes mellitus with other circulatory complications: Secondary | ICD-10-CM | POA: Diagnosis present

## 2013-06-02 DIAGNOSIS — R609 Edema, unspecified: Secondary | ICD-10-CM | POA: Diagnosis present

## 2013-06-02 DIAGNOSIS — R079 Chest pain, unspecified: Secondary | ICD-10-CM | POA: Diagnosis present

## 2013-06-02 DIAGNOSIS — E119 Type 2 diabetes mellitus without complications: Secondary | ICD-10-CM

## 2013-06-02 DIAGNOSIS — I10 Essential (primary) hypertension: Secondary | ICD-10-CM

## 2013-06-02 DIAGNOSIS — I369 Nonrheumatic tricuspid valve disorder, unspecified: Secondary | ICD-10-CM

## 2013-06-02 LAB — URINALYSIS, ROUTINE W REFLEX MICROSCOPIC
Bilirubin Urine: NEGATIVE
Ketones, ur: NEGATIVE mg/dL
Nitrite: NEGATIVE
Protein, ur: 100 mg/dL — AB
Specific Gravity, Urine: 1.01 (ref 1.005–1.030)
Urobilinogen, UA: 0.2 mg/dL (ref 0.0–1.0)

## 2013-06-02 LAB — GLUCOSE, CAPILLARY
Glucose-Capillary: 393 mg/dL — ABNORMAL HIGH (ref 70–99)
Glucose-Capillary: 259 mg/dL — ABNORMAL HIGH (ref 70–99)

## 2013-06-02 LAB — TROPONIN I
Troponin I: <0.3 ng/mL (ref <0.30)
Troponin I: <0.3 ng/mL (ref <0.30)
Troponin I: <0.3 ng/mL (ref <0.30)

## 2013-06-02 LAB — URINE MICROSCOPIC-ADD ON

## 2013-06-02 LAB — IRON AND TIBC
Iron: 65 ug/dL (ref 42–135)
Saturation Ratios: 23 % (ref 20–55)
TIBC: 281 ug/dL (ref 250–470)
UIBC: 216 ug/dL (ref 125–400)

## 2013-06-02 LAB — RETICULOCYTES: Retic Ct Pct: 3.6 % — ABNORMAL HIGH (ref 0.4–3.1)

## 2013-06-02 LAB — BASIC METABOLIC PANEL
BUN: 13 mg/dL (ref 6–23)
Chloride: 98 mEq/L (ref 96–112)
Creatinine, Ser: 1.68 mg/dL — ABNORMAL HIGH (ref 0.50–1.10)
GFR calc Af Amer: 41 mL/min — ABNORMAL LOW (ref >90)
GFR calc non Af Amer: 35 mL/min — ABNORMAL LOW (ref >90)
Potassium: 4.2 mEq/L (ref 3.5–5.1)

## 2013-06-02 LAB — CBC
Hemoglobin: 7 g/dL — ABNORMAL LOW (ref 12.0–15.0)
MCHC: 33.8 g/dL (ref 30.0–36.0)
RDW: 15.3 % (ref 11.5–15.5)
WBC: 4.6 10*3/uL (ref 4.0–10.5)

## 2013-06-02 LAB — FERRITIN: Ferritin: 146 ng/mL (ref 10–291)

## 2013-06-02 LAB — RAPID URINE DRUG SCREEN, HOSP PERFORMED
Barbiturates: NOT DETECTED
Cocaine: NOT DETECTED

## 2013-06-02 MED ORDER — POLYETHYLENE GLYCOL 3350 17 G PO PACK
17.0000 g | PACK | Freq: Every day | ORAL | Status: DC
  Administered 2013-06-02 – 2013-06-04 (×3): 17 g via ORAL
  Filled 2013-06-02 (×3): qty 1

## 2013-06-02 MED ORDER — ACETAMINOPHEN 325 MG PO TABS
650.0000 mg | ORAL_TABLET | Freq: Four times a day (QID) | ORAL | Status: DC | PRN
  Administered 2013-06-04: 650 mg via ORAL
  Filled 2013-06-02: qty 2

## 2013-06-02 MED ORDER — VITAMIN C 500 MG PO TABS
500.0000 mg | ORAL_TABLET | Freq: Every day | ORAL | Status: DC
  Administered 2013-06-02 – 2013-06-04 (×3): 500 mg via ORAL
  Filled 2013-06-02 (×3): qty 1

## 2013-06-02 MED ORDER — GLUCERNA SHAKE PO LIQD
237.0000 mL | Freq: Two times a day (BID) | ORAL | Status: DC
  Administered 2013-06-02 – 2013-06-04 (×5): 237 mL via ORAL

## 2013-06-02 MED ORDER — SODIUM CHLORIDE 0.9 % IJ SOLN
3.0000 mL | Freq: Two times a day (BID) | INTRAMUSCULAR | Status: DC
  Administered 2013-06-02 – 2013-06-03 (×4): 3 mL via INTRAVENOUS

## 2013-06-02 MED ORDER — ONDANSETRON HCL 4 MG/2ML IJ SOLN: 4.0000 mg | Freq: Four times a day (QID) | INTRAMUSCULAR | Status: DC | PRN

## 2013-06-02 MED ORDER — MORPHINE SULFATE 2 MG/ML IJ SOLN: 1.0000 mg | INTRAMUSCULAR | Status: DC | PRN

## 2013-06-02 MED ORDER — VANCOMYCIN HCL 10 G IV SOLR
1250.0000 mg | INTRAVENOUS | Status: DC
  Administered 2013-06-02 – 2013-06-04 (×3): 1250 mg via INTRAVENOUS
  Filled 2013-06-02 (×3): qty 1250

## 2013-06-02 MED ORDER — SODIUM CHLORIDE 0.9 % IJ SOLN
3.0000 mL | Freq: Two times a day (BID) | INTRAMUSCULAR | Status: DC
  Administered 2013-06-02 – 2013-06-04 (×5): 3 mL via INTRAVENOUS

## 2013-06-02 MED ORDER — LABETALOL HCL 100 MG PO TABS
100.0000 mg | ORAL_TABLET | Freq: Two times a day (BID) | ORAL | Status: DC
  Administered 2013-06-02 (×2): 100 mg via ORAL
  Filled 2013-06-02 (×3): qty 1

## 2013-06-02 MED ORDER — PNEUMOCOCCAL VAC POLYVALENT 25 MCG/0.5ML IJ INJ
0.5000 mL | INJECTION | INTRAMUSCULAR | Status: AC
  Administered 2013-06-03: 12:00:00 0.5 mL via INTRAMUSCULAR
  Filled 2013-06-02: qty 0.5

## 2013-06-02 MED ORDER — LEVETIRACETAM 500 MG PO TABS
1000.0000 mg | ORAL_TABLET | Freq: Every day | ORAL | Status: DC
  Administered 2013-06-02 – 2013-06-03 (×3): 1000 mg via ORAL
  Filled 2013-06-02 (×4): qty 2

## 2013-06-02 MED ORDER — ZOLPIDEM TARTRATE 5 MG PO TABS: 5.0000 mg | ORAL_TABLET | Freq: Every evening | ORAL | Status: DC | PRN

## 2013-06-02 MED ORDER — OXYCODONE-ACETAMINOPHEN 5-325 MG PO TABS
1.0000 | ORAL_TABLET | ORAL | Status: DC | PRN
  Administered 2013-06-02 – 2013-06-03 (×6): 2 via ORAL
  Filled 2013-06-02 (×7): qty 2

## 2013-06-02 MED ORDER — INSULIN ASPART 100 UNIT/ML ~~LOC~~ SOLN
0.0000 [IU] | Freq: Three times a day (TID) | SUBCUTANEOUS | Status: DC
  Administered 2013-06-02: 08:00:00 9 [IU] via SUBCUTANEOUS
  Administered 2013-06-02: 2 [IU] via SUBCUTANEOUS
  Administered 2013-06-02: 17:00:00 5 [IU] via SUBCUTANEOUS
  Administered 2013-06-03: 1 [IU] via SUBCUTANEOUS
  Administered 2013-06-03 (×2): 2 [IU] via SUBCUTANEOUS
  Administered 2013-06-04: 1 [IU] via SUBCUTANEOUS

## 2013-06-02 MED ORDER — CARVEDILOL 6.25 MG PO TABS
6.2500 mg | ORAL_TABLET | Freq: Two times a day (BID) | ORAL | Status: DC
  Administered 2013-06-02 – 2013-06-04 (×4): 6.25 mg via ORAL
  Filled 2013-06-02 (×6): qty 1

## 2013-06-02 MED ORDER — ONDANSETRON HCL 4 MG PO TABS: 4.0000 mg | ORAL_TABLET | Freq: Four times a day (QID) | ORAL | Status: DC | PRN

## 2013-06-02 MED ORDER — INSULIN GLARGINE 100 UNIT/ML ~~LOC~~ SOLN
30.0000 [IU] | Freq: Every day | SUBCUTANEOUS | Status: DC
  Administered 2013-06-02: 02:00:00 30 [IU] via SUBCUTANEOUS
  Filled 2013-06-02 (×2): qty 0.3

## 2013-06-02 MED ORDER — ACETAMINOPHEN 650 MG RE SUPP: 650.0000 mg | Freq: Four times a day (QID) | RECTAL | Status: DC | PRN

## 2013-06-02 MED ORDER — HYDROCHLOROTHIAZIDE 12.5 MG PO CAPS
12.5000 mg | ORAL_CAPSULE | Freq: Every day | ORAL | Status: DC
  Administered 2013-06-03 – 2013-06-04 (×2): 12.5 mg via ORAL
  Filled 2013-06-02 (×2): qty 1

## 2013-06-02 MED ORDER — ASPIRIN EC 325 MG PO TBEC
325.0000 mg | DELAYED_RELEASE_TABLET | Freq: Every day | ORAL | Status: DC
  Administered 2013-06-02 – 2013-06-04 (×3): 325 mg via ORAL
  Filled 2013-06-02 (×3): qty 1

## 2013-06-02 MED ORDER — SENNOSIDES-DOCUSATE SODIUM 8.6-50 MG PO TABS
2.0000 | ORAL_TABLET | Freq: Every day | ORAL | Status: DC
  Administered 2013-06-02 – 2013-06-03 (×3): 2 via ORAL
  Filled 2013-06-02 (×4): qty 2

## 2013-06-02 MED ORDER — HYDRALAZINE HCL 20 MG/ML IJ SOLN: 10.0000 mg | INTRAMUSCULAR | Status: DC | PRN

## 2013-06-02 MED ORDER — INSULIN GLARGINE 100 UNIT/ML ~~LOC~~ SOLN
30.0000 [IU] | Freq: Two times a day (BID) | SUBCUTANEOUS | Status: DC
  Administered 2013-06-02 – 2013-06-03 (×2): 30 [IU] via SUBCUTANEOUS
  Filled 2013-06-02 (×4): qty 0.3

## 2013-06-02 MED ORDER — DULOXETINE HCL 60 MG PO CPEP
60.0000 mg | ORAL_CAPSULE | Freq: Every day | ORAL | Status: DC
  Administered 2013-06-02 – 2013-06-04 (×3): 60 mg via ORAL
  Filled 2013-06-02 (×3): qty 1

## 2013-06-02 NOTE — Progress Notes (Signed)
*  PRELIMINARY RESULTS* Echocardiogram 2D Echocardiogram has been performed.  Leavy Cella 06/02/2013, 10:43 AM

## 2013-06-02 NOTE — Progress Notes (Signed)
Utilization Review Completed.Donne Anon T10/29/2014

## 2013-06-02 NOTE — Progress Notes (Signed)
[facility] PROGRESS NOTE  Assessment/Plan: Hypertension, accelerated - Labetalol and hydralazine. D/c labetalol restart coreg. - Echo 10.29.2014: ejection fraction range of 60% to 65%. grade 1 diastolic dysfunction. - Add diuretic for home. Off lisinopril  Chest pain - resolved, cardiac marker negative, echo no wall motion abnormality.  Normocytic anemia: - Check Anemia panel. - Monitor Hbg.  Diabetes mellitus - change Lantuss to BID plus SSI.  Lower extremity edema  - ? Due to surgery vs infection. ECHO: showed diastolic dysfunction, but this would not explaing severity of her lower extremity edema. - MRI pending. Rule out infectious etiology. On empiric vanc. - added low dose HCTZ.  CKD (chronic kidney disease) stage 3, GFR 30-59 ml/min - at baseline. - resume BP medications.  Seizure disorder - none.    Code Status: Full code.  Family Communication: None.  Disposition Plan: Admit to inpatient.     Consultants:  ORthopedics  Procedures:  MRI  Antibiotics:  Vancomycin 10.29.2014  HPI/Subjective: Complaining of leg pain.  Objective: Filed Vitals:   06/02/13 0044 06/02/13 0107 06/02/13 0636 06/02/13 1058  BP: 169/89 174/86 167/93 139/70  Pulse: 75 88 86 88  Temp:  97.6 F (36.4 C) 97.6 F (36.4 C) 97.7 F (36.5 C)  TempSrc:  Oral Oral Oral  Resp: 18 18 16 20   Height:  5\' 4"  (1.626 m)    Weight:  83.28 kg (183 lb 9.6 oz)    SpO2: 99% 100% 100% 100%    Intake/Output Summary (Last 24 hours) at 06/02/13 1320 Last data filed at 06/02/13 0900  Gross per 24 hour  Intake    660 ml  Output   1100 ml  Net   -440 ml   Filed Weights   06/01/13 1615 06/02/13 0107  Weight: 89.812 kg (198 lb) 83.28 kg (183 lb 9.6 oz)    Exam:  General: Alert, awake, oriented x3, in no acute distress.  HEENT: No bruits, no goiter.  Heart: Regular rate and rhythm, without murmurs, rubs, gallops.  Lungs: Good air movement, clear to auscultation. Abdomen:  Soft, nontender, nondistended, positive bowel sounds.  Neuro: Grossly intact, nonfocal.   Data Reviewed: Basic Metabolic Panel:  Recent Labs Lab 06/01/13 1619 06/02/13 0223  NA 134* 133*  K 4.5 4.2  CL 98 98  CO2 28 29  GLUCOSE 303* 434*  BUN 14 13  CREATININE 1.75* 1.68*  CALCIUM 8.4 8.2*   Liver Function Tests:  Recent Labs Lab 06/01/13 1619  AST 27  ALT 13  ALKPHOS 121*  BILITOT 0.2*  PROT 7.5  ALBUMIN 1.8*   No results found for this basename: LIPASE, AMYLASE,  in the last 168 hours No results found for this basename: AMMONIA,  in the last 168 hours CBC:  Recent Labs Lab 06/01/13 1619 06/02/13 0223  WBC 5.2 4.6  HGB 7.5* 7.0*  HCT 22.6* 20.7*  MCV 85.0 84.8  PLT 320 280   Cardiac Enzymes:  Recent Labs Lab 06/02/13 0223 06/02/13 0732  TROPONINI <0.30 <0.30   BNP (last 3 results)  Recent Labs  06/01/13 1619  PROBNP 1210.0*   CBG:  Recent Labs Lab 06/02/13 0159 06/02/13 0730  GLUCAP 393* 377*    No results found for this or any previous visit (from the past 240 hour(s)).   Studies: Dg Chest 2 View  06/01/2013   CLINICAL DATA:  Chest pain  EXAM: CHEST  2 VIEW  COMPARISON:  05/17/2013  FINDINGS: Cardiomediastinal silhouette is stable. No pulmonary edema. There is  small right pleural effusion with right basilar atelectasis or infiltrate. Bony thorax is unremarkable.  IMPRESSION: No pulmonary edema. Small right pleural effusion with right basilar atelectasis or infiltrate.   Electronically Signed   By: Lahoma Crocker M.D.   On: 06/01/2013 17:10   Nm Pulmonary Perf And Vent  06/01/2013   CLINICAL DATA:  Pulmonary embolism. Chest pain. Recent operation.  EXAM: NUCLEAR MEDICINE VENTILATION - PERFUSION LUNG SCAN  TECHNIQUE: Ventilation images were obtained in multiple projections using inhaled aerosol technetium 99 M DTPA. Perfusion images were obtained in multiple projections after intravenous injection of Tc-50m MAA.  COMPARISON:  Chest radiograph  06/01/2013.  RADIOPHARMACEUTICALS:  Forty mCi Tc-79m DTPA aerosol and 6 mCi Tc-75m MAA  FINDINGS: Ventilation: Patchy ventilation is present. Some of the radiotracer was swallowed and is present in the stomach. Particulate radiotracer outlines the trachea.  Perfusion: The perfusion study is normal.  IMPRESSION: Normal perfusion study.  No findings of pulmonary embolism.   Electronically Signed   By: Dereck Ligas M.D.   On: 06/01/2013 21:30   Dg Foot 2 Views Left  06/02/2013   CLINICAL DATA:  Left foot pain and stump drainage.  EXAM: LEFT FOOT - 2 VIEW  COMPARISON:  MRI of the left foot May 19, 2013  FINDINGS: Status post transmetatarsal amputation, minimal osteopenia lung the lateral aspect of the medial cuneiform seen only on the frontal radiograph. No fracture deformity or dislocation. Type 2 navicular bone. Mild irregularity of the stomach soft tissues Common a radiopaque foreign bodies ; ankle soft tissue swelling.  IMPRESSION: Status post transmetatarsal amputation, with expected postoperative changes; slight lucency along the lateral aspect of the medial cuneiform, which may be projectional though, if clinical concern for recurrent osteomyelitis consider repeat MRI of the foot. No acute fracture deformity or dislocation.   Electronically Signed   By: Elon Alas   On: 06/02/2013 00:50    Scheduled Meds: . aspirin EC  325 mg Oral Daily  . DULoxetine  60 mg Oral Daily  . feeding supplement (GLUCERNA SHAKE)  237 mL Oral BID BM  . insulin aspart  0-9 Units Subcutaneous TID WC  . insulin glargine  30 Units Subcutaneous QHS  . labetalol  100 mg Oral BID  . levETIRAcetam  1,000 mg Oral QHS  . [START ON 06/03/2013] pneumococcal 23 valent vaccine  0.5 mL Intramuscular Tomorrow-1000  . polyethylene glycol  17 g Oral Daily  . senna-docusate  2 tablet Oral QHS  . sodium chloride  3 mL Intravenous Q12H  . sodium chloride  3 mL Intravenous Q12H  . vancomycin  1,250 mg Intravenous Q24H  .  vitamin C  500 mg Oral Daily   Continuous Infusions:    Charlynne Cousins  [facility] Pager [phone]. If 8PM-8AM, please contact night-coverage at [email or web address], password [facility] 06/02/2013, 1:20 PM  LOS: 1 day

## 2013-06-02 NOTE — Consult Note (Signed)
Reason for Consult: Rule out osteomyelitis left foot Referring Physician: Asianna [name] is an 47 y.o. female.  HPI: Patient is a 47 year old woman who is 1 week status post a midfoot amputation on the left. Patient has had increasing swelling slight amount of wound dehiscence secondary to congestive heart failure and is seen for evaluation of her left midfoot amputation.  Past Medical History  Diagnosis Date  . Diabetes mellitus   . Hepatitis C virus   . Neuropathy   . Glaucoma   . Bipolar 1 disorder   . CHF (congestive heart failure)     Past Surgical History  Procedure Laterality Date  . Abdominal hysterectomy    . Amputation Left 05/21/2013    Procedure: Left Midfoot AMPUTATION;  Surgeon: Newt Minion, MD;  Location: [facility];  Service: Orthopedics;  Laterality: Left;  Left Midfoot Amputation    History reviewed. No pertinent family history.  Social History:  reports that she has quit smoking. She has never used smokeless tobacco. She reports that she does not drink alcohol or use illicit drugs.  Allergies:  Allergies  Allergen Reactions  . Gabapentin Itching and Swelling  . Penicillins Hives    Medications: I have reviewed the patient's current medications.  Results for orders placed during the hospital encounter of 06/01/13 (from the past 48 hour(s))  CBC     Status: Abnormal   Collection Time    06/01/13  4:19 PM      Result Value Range   WBC 5.2  4.0 - 10.5 K/uL   RBC 2.66 (*) 3.87 - 5.11 MIL/uL   Hemoglobin 7.5 (*) 12.0 - 15.0 g/dL   HCT 22.6 (*) 36.0 - 46.0 %   MCV 85.0  78.0 - 100.0 fL   MCH 28.2  26.0 - 34.0 pg   MCHC 33.2  30.0 - 36.0 g/dL   RDW 15.0  11.5 - 15.5 %   Platelets 320  150 - 400 K/uL  BASIC METABOLIC PANEL     Status: Abnormal   Collection Time    06/01/13  4:19 PM      Result Value Range   Sodium 134 (*) 135 - 145 mEq/L   Potassium 4.5  3.5 - 5.1 mEq/L   Chloride 98  96 - 112 mEq/L   CO2 28  19 - 32 mEq/L   Glucose, Bld 303 (*)  70 - 99 mg/dL   BUN 14  6 - 23 mg/dL   Creatinine, Ser 1.75 (*) 0.50 - 1.10 mg/dL   Calcium 8.4  8.4 - 10.5 mg/dL   GFR calc non Af Amer 34 (*) >90 mL/min   GFR calc Af Amer 39 (*) >90 mL/min   Comment: (NOTE)     The eGFR has been calculated using the CKD EPI equation.     This calculation has not been validated in all clinical situations.     eGFR's persistently <90 mL/min signify possible Chronic Kidney     Disease.  PRO B NATRIURETIC PEPTIDE     Status: Abnormal   Collection Time    06/01/13  4:19 PM      Result Value Range   Pro B Natriuretic peptide (BNP) 1210.0 (*) 0 - 125 pg/mL  HEPATIC FUNCTION PANEL     Status: Abnormal   Collection Time    06/01/13  4:19 PM      Result Value Range   Total Protein 7.5  6.0 - 8.3 g/dL   Albumin 1.8 (*)  3.5 - 5.2 g/dL   AST 27  0 - 37 U/L   ALT 13  0 - 35 U/L   Alkaline Phosphatase 121 (*) 39 - 117 U/L   Total Bilirubin 0.2 (*) 0.3 - 1.2 mg/dL   Bilirubin, Direct <0.1  0.0 - 0.3 mg/dL   Indirect Bilirubin NOT CALCULATED  0.3 - 0.9 mg/dL  POCT I-STAT TROPONIN I     Status: None   Collection Time    06/01/13  4:38 PM      Result Value Range   Troponin i, poc 0.00  0.00 - 0.08 ng/mL   Comment 3            Comment: Due to the release kinetics of cTnI,     a negative result within the first hours     of the onset of symptoms does not rule out     myocardial infarction with certainty.     If myocardial infarction is still suspected,     repeat the test at appropriate intervals.  GLUCOSE, CAPILLARY     Status: Abnormal   Collection Time    06/02/13  1:59 AM      Result Value Range   Glucose-Capillary 393 (*) 70 - 99 mg/dL   Comment 1 Notify RN    TROPONIN I     Status: None   Collection Time    06/02/13  2:23 AM      Result Value Range   Troponin I <0.30  <0.30 ng/mL   Comment:            Due to the release kinetics of cTnI,     a negative result within the first hours     of the onset of symptoms does not rule out      myocardial infarction with certainty.     If myocardial infarction is still suspected,     repeat the test at appropriate intervals.  BASIC METABOLIC PANEL     Status: Abnormal   Collection Time    06/02/13  2:23 AM      Result Value Range   Sodium 133 (*) 135 - 145 mEq/L   Potassium 4.2  3.5 - 5.1 mEq/L   Chloride 98  96 - 112 mEq/L   CO2 29  19 - 32 mEq/L   Glucose, Bld 434 (*) 70 - 99 mg/dL   BUN 13  6 - 23 mg/dL   Creatinine, Ser 1.68 (*) 0.50 - 1.10 mg/dL   Calcium 8.2 (*) 8.4 - 10.5 mg/dL   GFR calc non Af Amer 35 (*) >90 mL/min   GFR calc Af Amer 41 (*) >90 mL/min   Comment: (NOTE)     The eGFR has been calculated using the CKD EPI equation.     This calculation has not been validated in all clinical situations.     eGFR's persistently <90 mL/min signify possible Chronic Kidney     Disease.  CBC     Status: Abnormal   Collection Time    06/02/13  2:23 AM      Result Value Range   WBC 4.6  4.0 - 10.5 K/uL   RBC 2.44 (*) 3.87 - 5.11 MIL/uL   Hemoglobin 7.0 (*) 12.0 - 15.0 g/dL   HCT 20.7 (*) 36.0 - 46.0 %   MCV 84.8  78.0 - 100.0 fL   MCH 28.7  26.0 - 34.0 pg   MCHC 33.8  30.0 - 36.0 g/dL   RDW  15.3  11.5 - 15.5 %   Platelets 280  150 - 400 K/uL  URINALYSIS, ROUTINE W REFLEX MICROSCOPIC     Status: Abnormal   Collection Time    06/02/13  2:34 AM      Result Value Range   Color, Urine YELLOW  YELLOW   APPearance CLEAR  CLEAR   Specific Gravity, Urine 1.010  1.005 - 1.030   pH 7.0  5.0 - 8.0   Glucose, UA 500 (*) NEGATIVE mg/dL   Hgb urine dipstick TRACE (*) NEGATIVE   Bilirubin Urine NEGATIVE  NEGATIVE   Ketones, ur NEGATIVE  NEGATIVE mg/dL   Protein, ur 100 (*) NEGATIVE mg/dL   Urobilinogen, UA 0.2  0.0 - 1.0 mg/dL   Nitrite NEGATIVE  NEGATIVE   Leukocytes, UA SMALL (*) NEGATIVE  URINE RAPID DRUG SCREEN (HOSP PERFORMED)     Status: None   Collection Time    06/02/13  2:34 AM      Result Value Range   Opiates NONE DETECTED  NONE DETECTED   Cocaine NONE  DETECTED  NONE DETECTED   Benzodiazepines NONE DETECTED  NONE DETECTED   Amphetamines NONE DETECTED  NONE DETECTED   Tetrahydrocannabinol NONE DETECTED  NONE DETECTED   Barbiturates NONE DETECTED  NONE DETECTED   Comment:            DRUG SCREEN FOR MEDICAL PURPOSES     ONLY.  IF CONFIRMATION IS NEEDED     FOR ANY PURPOSE, NOTIFY LAB     WITHIN 5 DAYS.                LOWEST DETECTABLE LIMITS     FOR URINE DRUG SCREEN     Drug Class       Cutoff (ng/mL)     Amphetamine      1000     Barbiturate      200     Benzodiazepine   [id number]     Tricyclics       [id number]     Opiates          300     Cocaine          300     THC              50  URINE MICROSCOPIC-ADD ON     Status: None   Collection Time    06/02/13  2:34 AM      Result Value Range   Squamous Epithelial / LPF RARE  RARE   WBC, UA 0-2  <3 WBC/hpf   RBC / HPF 0-2  <3 RBC/hpf    Dg Chest 2 View  06/01/2013   CLINICAL DATA:  Chest pain  EXAM: CHEST  2 VIEW  COMPARISON:  05/17/2013  FINDINGS: Cardiomediastinal silhouette is stable. No pulmonary edema. There is small right pleural effusion with right basilar atelectasis or infiltrate. Bony thorax is unremarkable.  IMPRESSION: No pulmonary edema. Small right pleural effusion with right basilar atelectasis or infiltrate.   Electronically Signed   By: Lahoma Crocker M.D.   On: 06/01/2013 17:10   Nm Pulmonary Perf And Vent  06/01/2013   CLINICAL DATA:  Pulmonary embolism. Chest pain. Recent operation.  EXAM: NUCLEAR MEDICINE VENTILATION - PERFUSION LUNG SCAN  TECHNIQUE: Ventilation images were obtained in multiple projections using inhaled aerosol technetium 99 M DTPA. Perfusion images were obtained in multiple projections after intravenous injection of Tc-68m MAA.  COMPARISON:  Chest radiograph 06/01/2013.  RADIOPHARMACEUTICALS:  Forty mCi Tc-45m DTPA aerosol and 6 mCi Tc-33m MAA  FINDINGS: Ventilation: Patchy ventilation is present. Some of the radiotracer was swallowed and is present in the  stomach. Particulate radiotracer outlines the trachea.  Perfusion: The perfusion study is normal.  IMPRESSION: Normal perfusion study.  No findings of pulmonary embolism.   Electronically Signed   By: Dereck Ligas M.D.   On: 06/01/2013 21:30   Dg Foot 2 Views Left  06/02/2013   CLINICAL DATA:  Left foot pain and stump drainage.  EXAM: LEFT FOOT - 2 VIEW  COMPARISON:  MRI of the left foot May 19, 2013  FINDINGS: Status post transmetatarsal amputation, minimal osteopenia lung the lateral aspect of the medial cuneiform seen only on the frontal radiograph. No fracture deformity or dislocation. Type 2 navicular bone. Mild irregularity of the stomach soft tissues Common a radiopaque foreign bodies ; ankle soft tissue swelling.  IMPRESSION: Status post transmetatarsal amputation, with expected postoperative changes; slight lucency along the lateral aspect of the medial cuneiform, which may be projectional though, if clinical concern for recurrent osteomyelitis consider repeat MRI of the foot. No acute fracture deformity or dislocation.   Electronically Signed   By: Elon Alas   On: 06/02/2013 00:50    Review of Systems  All other systems reviewed and are negative.   Blood pressure 167/93, pulse 86, temperature 97.6 F (36.4 C), temperature source Oral, resp. rate 16, height 5\' 4"  (1.626 m), weight 83.28 kg (183 lb 9.6 oz), SpO2 100.00%. Physical Exam On examination patient has significant swelling of the entire left lower extremity there is pitting edema up to the tibial tubercle. There is no ulcerations no leg. There is slight dehiscence of the wound due to the swelling. Review of the radiographs show no evidence of osteomyelitis. Assessment/Plan: Assessment: Swelling left lower extremity status post midfoot amputation with no signs of osteomyelitis.  Plan: Will have the wound care nurses wrap the left lower extremity in a Profore compression wrap. Will plan to followup in the office in one  week.  [name] 06/02/2013, 6:52 AM

## 2013-06-02 NOTE — ED Notes (Signed)
Attempted report 

## 2013-06-02 NOTE — Progress Notes (Addendum)
Inpatient Diabetes Program Recommendations  AACE/ADA: New Consensus Statement on Inpatient Glycemic Control (2013)  Target Ranges:  Prepandial:   less than 140 mg/dL      Peak postprandial:   less than 180 mg/dL (1-2 hours)      Critically ill patients:  140 - 180 mg/dL   Pt history and med rec documents patient takes lantus 45 units at HS at home Pt takes 2-20 units tidwc novolog sliding scale for meals. Recommend adding Novolog meal coverage of 3 units ticwc  Thank you, Rosita Kea, RN, CNS, Diabetes Coordinator [phone])

## 2013-06-02 NOTE — ED Notes (Addendum)
Pt has an open wound to her left end foot. Pt states she had it amputated last Monday went in for a recheck and her BP and stitches were coming apart. Pt has dressing applied to left foot.

## 2013-06-02 NOTE — ED Notes (Signed)
Admitting MD at bedside, saw pt's foot. Placed x-ray orders.

## 2013-06-02 NOTE — H&P (Signed)
[facility] History and Physical  [name] [id number] DOB: 23-Aug-1965 DOA: 06/01/2013  Referring physician: ER physician. PCP: [name], MD   Chief Complaint: Chest pain and increasing peripheral edema.  HPI: [name] is a 47 y.o. female with known history of diabetes mellitus type 2, hypertension, seizure disorder, hepatitis C who has had a recent left foot transmetatarsal amputation for cellulitis and abscess presents to the ER because of chest pain. Patient states that patient has been experiencing chest pain for last few days which was retrosternal nonradiating. Patient also has noticed increasing swelling of the lower extremities over the last one week. Patient gets short of breath on lying flat and on exertion. Patient had followed up with her PCP yesterday and was found to have elevated blood pressure and was referred to the ER. In the ER patient had VQ scan which was negative for PE. Patient was found to have elevated blood pressure and was initially given clonidine followed by Lasix 60 mg IV and labetalol 20 mg IV. Cardiac markers and EKG were unremarkable. Patient has been admitted for further management. Patient denies any nausea vomiting abdominal pain diarrhea. Patient states her left leg wound has given way and has some discharge. Initial labs shows worsening of her creatinine.  Review of Systems: As presented in the history of presenting illness, rest negative.  Past Medical History  Diagnosis Date  . Diabetes mellitus   . Hepatitis C virus   . Neuropathy   . Glaucoma   . Bipolar 1 disorder   . CHF (congestive heart failure)    Past Surgical History  Procedure Laterality Date  . Abdominal hysterectomy    . Amputation Left 05/21/2013    Procedure: Left Midfoot AMPUTATION;  Surgeon: Newt Minion, MD;  Location: [facility];  Service: Orthopedics;  Laterality: Left;  Left Midfoot Amputation   Social History:  reports that she has quit smoking. She has  never used smokeless tobacco. She reports that she does not drink alcohol or use illicit drugs. Where does patient live home. Can patient participate in ADLs? yes.  Allergies  Allergen Reactions  . Gabapentin Itching and Swelling  . Penicillins Hives    Family History: History reviewed. No pertinent family history.    Prior to Admission medications   Medication Sig Start Date End Date Taking? Authorizing Provider  carvedilol (COREG) 6.25 MG tablet Take 1 tablet (6.25 mg total) by mouth 2 (two) times daily with a meal. 05/24/13  Yes Bobby Rumpf York, PA-C  DULoxetine (CYMBALTA) 60 MG capsule Take 60 mg by mouth daily.    Yes Historical Provider, MD  feeding supplement, GLUCERNA SHAKE, (GLUCERNA SHAKE) LIQD Take 237 mLs by mouth 2 (two) times daily between meals. 05/24/13  Yes Marianne L York, PA-C  insulin aspart (NOVOLOG) 100 UNIT/ML injection Inject 2-20 Units into the skin 3 (three) times daily before meals. Sliding scale   Yes Historical Provider, MD  insulin glargine (LANTUS) 100 UNIT/ML injection Inject 0.3 mLs (30 Units total) into the skin at bedtime. 05/24/13  Yes Marianne L York, PA-C  levETIRAcetam (KEPPRA) 1000 MG tablet Take 1,000 mg by mouth at bedtime.    Yes Historical Provider, MD  levofloxacin (LEVAQUIN) 750 MG tablet Take 1 tablet (750 mg total) by mouth daily. 05/24/13  Yes Marianne L York, PA-C  MAGNESIUM PO Take 1 tablet by mouth daily.   Yes Historical Provider, MD  oxyCODONE-acetaminophen (PERCOCET/ROXICET) 5-325 MG per tablet Take 1-2 tablets by mouth every  4 (four) hours as needed. 05/24/13  Yes Marianne L York, PA-C  polyethylene glycol (MIRALAX / GLYCOLAX) packet Take 17 g by mouth daily. 05/24/13  Yes Marianne L York, PA-C  senna-docusate (SENOKOT-S) 8.6-50 MG per tablet Take 2 tablets by mouth at bedtime. 05/24/13  Yes Marianne L York, PA-C  vitamin C (ASCORBIC ACID) 500 MG tablet Take 500 mg by mouth daily.   Yes Historical Provider, MD  zolpidem (AMBIEN CR)  6.25 MG CR tablet Take 6.25 mg by mouth at bedtime as needed for sleep.   Yes Historical Provider, MD    Physical Exam: Filed Vitals:   06/01/13 2000 06/01/13 2304 06/01/13 2330 06/02/13 0044  BP: 166/123 185/98 179/103 169/89  Pulse: 104 92 105 75  Temp:  98 F (36.7 C)    TempSrc:  Oral    Resp: 15 16  18   Weight:      SpO2: 99% 100% 100% 99%     General:  Well-developed and nourished.  Eyes: Anicteric no pallor.  ENT: No discharge from ears eyes nose mouth.  Neck: No mass felt.  Cardiovascular: S1-S2 heard.  Respiratory: No rhonchi or crepitations.  Abdomen: Soft nontender bowel sounds present.  Skin: Left leg wound has mild gaping with discharge.  Musculoskeletal: Bilateral lower extremity edema.  Psychiatric: Appears normal.  Neurologic: Alert and oriented to time place and person. Moves all extremities.  Labs on Admission:  Basic Metabolic Panel:  Recent Labs Lab 06/01/13 1619  NA 134*  K 4.5  CL 98  CO2 28  GLUCOSE 303*  BUN 14  CREATININE 1.75*  CALCIUM 8.4   Liver Function Tests:  Recent Labs Lab 06/01/13 1619  AST 27  ALT 13  ALKPHOS 121*  BILITOT 0.2*  PROT 7.5  ALBUMIN 1.8*   No results found for this basename: LIPASE, AMYLASE,  in the last 168 hours No results found for this basename: AMMONIA,  in the last 168 hours CBC:  Recent Labs Lab 06/01/13 1619  WBC 5.2  HGB 7.5*  HCT 22.6*  MCV 85.0  PLT 320   Cardiac Enzymes: No results found for this basename: CKTOTAL, CKMB, CKMBINDEX, TROPONINI,  in the last 168 hours  BNP (last 3 results)  Recent Labs  06/01/13 1619  PROBNP 1210.0*   CBG: No results found for this basename: GLUCAP,  in the last 168 hours  Radiological Exams on Admission: Dg Chest 2 View  06/01/2013   CLINICAL DATA:  Chest pain  EXAM: CHEST  2 VIEW  COMPARISON:  05/17/2013  FINDINGS: Cardiomediastinal silhouette is stable. No pulmonary edema. There is small right pleural effusion with right basilar  atelectasis or infiltrate. Bony thorax is unremarkable.  IMPRESSION: No pulmonary edema. Small right pleural effusion with right basilar atelectasis or infiltrate.   Electronically Signed   By: Lahoma Crocker M.D.   On: 06/01/2013 17:10   Nm Pulmonary Perf And Vent  06/01/2013   CLINICAL DATA:  Pulmonary embolism. Chest pain. Recent operation.  EXAM: NUCLEAR MEDICINE VENTILATION - PERFUSION LUNG SCAN  TECHNIQUE: Ventilation images were obtained in multiple projections using inhaled aerosol technetium 99 M DTPA. Perfusion images were obtained in multiple projections after intravenous injection of Tc-57m MAA.  COMPARISON:  Chest radiograph 06/01/2013.  RADIOPHARMACEUTICALS:  Forty mCi Tc-24m DTPA aerosol and 6 mCi Tc-20m MAA  FINDINGS: Ventilation: Patchy ventilation is present. Some of the radiotracer was swallowed and is present in the stomach. Particulate radiotracer outlines the trachea.  Perfusion: The perfusion study is normal.  IMPRESSION: Normal perfusion study.  No findings of pulmonary embolism.   Electronically Signed   By: Dereck Ligas M.D.   On: 06/01/2013 21:30    EKG: Independently reviewed. Normal sinus rhythm.  Assessment/Plan Principal Problem:   Hypertension, accelerated Active Problems:   Seizure disorder   CKD (chronic kidney disease) stage 3, GFR 30-59 ml/min   Chest pain   Diabetes mellitus   Edema   1. Accelerated hypertension - patient has received one dose oh Lasix  60 mg IV and also labetalol 20 mg IV one dose. At this time I have placed patient on labetalol 100 mg by mouth twice a day and hydralazine 10 mg IV every 4 when necessary for systolic blood pressure more than 160. We will hold off her Coreg since patient has been placed on labetalol. Further doses of Lasix to be decided based on patient's response. 2. Chest pain - probably secondary to uncontrolled blood pressure. Cycle cardiac markers. Check 2-D echo. 3. Lower extremity edema - probably secondary to fluid  overload probably secondary to liver disease. Check 2-D echo. Further dose of Lasix to be decided based on patient's response to initial dose. Closely follow intake output. 4. Left leg wound with recent surgery - patient's sutures were given may mild discharge. I have ordered left foot x-ray to see any bone involvement. Wound consult requested. Patient for now will be placed on vancomycin. 5. Acute on chronic renal failure - follow UA. Patient has received Lasix so closely follow metabolic panel and intake output. 6. Diabetes mellitus type 2 - continue home medications with close followup of CBGs. 7. Chronic anemia and patient is Jehovah's weakness and will not allow blood transfusions. 8. History of seizures - continue present medications.    Code Status: Full code.  Family Communication: None.  Disposition Plan: Admit to inpatient.    Jahzier Villalon N. [facility] Pager [phone].  If 7PM-7AM, please contact night-coverage [email or web address] Password [email or web address] 06/02/2013, 12:45 AM

## 2013-06-02 NOTE — Consult Note (Signed)
WOC wound consult note Reason for Consult: requested per Dr. Sharol Given to place Profore compression wrap to the LLE. Pt has 3+ pitting edema of the LLE, no open wounds on the leg, however she does have some dehiscence of the amputation site, with yellow slough Wound type: dehiscence of the left midfoot amputation site with sutures mostly intace Measurement:2.5cm x 4.5cm x 0.2cm  Wound [id number] with some macerated appearing tissue Drainage (amount, consistency, odor) moderate serosanguinous drainage. Periwound: intact but with significant edema Dressing procedure/placement/frequency: non adherent dressing placed over the surgical site, 4 layer compression wrap (Profore) placed entirely over stump site to the patellar notch.  Pt able to flex foot at the ankle.    To follow up in one week with Dr. Sharol Given for compression wrap change and wound assessment.  Instructed the patient that the wrap should not get wet.   Discussed POC with patient and bedside nurse.  Re consult if needed, will not follow at this time. Thanks  Mckaylin Bastien Kellogg, [place] [phone])

## 2013-06-02 NOTE — Progress Notes (Signed)
ANTIBIOTIC CONSULT NOTE - INITIAL  Pharmacy Consult for Vancomycin  Indication: Left stump cellulitis  Allergies  Allergen Reactions  . Gabapentin Itching and Swelling  . Penicillins Hives    Patient Measurements: Weight: 198 lb (89.812 kg)  Vital Signs: Temp: 97.6 F (36.4 C) (10/29 0107) Temp src: Oral (10/29 0107) BP: 174/86 mmHg (10/29 0107) Pulse Rate: 88 (10/29 0107)  Labs:  Recent Labs  06/01/13 1619  WBC 5.2  HGB 7.5*  PLT 320  CREATININE 1.75*     Microbiology: Recent Results (from the past 720 hour(s))  WOUND CULTURE     Status: None   Collection Time    05/17/13  9:35 PM      Result Value Range Status   Specimen Description WOUND LEFT FOOT   Final   Special Requests NONE   Final   Gram Stain     Final   Value: NO WBC SEEN     NO SQUAMOUS EPITHELIAL CELLS SEEN     MODERATE GRAM POSITIVE COCCI IN PAIRS     Performed at [facility]   Culture     Final   Value: ABUNDANT GROUP B STREP(S.AGALACTIAE)ISOLATED     Note: TESTING AGAINST S. AGALACTIAE NOT ROUTINELY PERFORMED DUE TO PREDICTABILITY OF AMP/PEN/VAN SUSCEPTIBILITY.     Performed at [facility]   Report Status 05/20/2013 FINAL   Final  CULTURE, BLOOD (ROUTINE X 2)     Status: None   Collection Time    05/17/13  9:55 PM      Result Value Range Status   Specimen Description BLOOD LEFT HAND   Final   Special Requests BOTTLES DRAWN AEROBIC ONLY 10ML   Final   Culture  Setup Time     Final   Value: 05/18/2013 07:40     Performed at [facility]   Culture     Final   Value: NO GROWTH 5 DAYS     Performed at [facility]   Report Status 05/24/2013 FINAL   Final  CULTURE, BLOOD (ROUTINE X 2)     Status: None   Collection Time    05/17/13  9:55 PM      Result Value Range Status   Specimen Description BLOOD RIGHT ARM   Final   Special Requests BOTTLES DRAWN AEROBIC ONLY 10ML   Final   Culture  Setup Time     Final   Value: 05/18/2013 07:40     Performed at  [facility]   Culture     Final   Value: NO GROWTH 5 DAYS     Performed at [facility]   Report Status 05/24/2013 FINAL   Final  MRSA PCR SCREENING     Status: None   Collection Time    05/18/13 12:55 AM      Result Value Range Status   MRSA by PCR NEGATIVE  NEGATIVE Final   Comment:            The GeneXpert MRSA Assay (FDA     approved for NASAL specimens     only), is one component of a     comprehensive MRSA colonization     surveillance program. It is not     intended to diagnose MRSA     infection nor to guide or     monitor treatment for     MRSA infections.  SURGICAL PCR SCREEN     Status: None   Collection Time  05/21/13  7:52 AM      Result Value Range Status   MRSA, PCR NEGATIVE  NEGATIVE Final   Staphylococcus aureus NEGATIVE  NEGATIVE Final   Comment:            The Xpert SA Assay (FDA     approved for NASAL specimens     in patients over 16 years of age),     is one component of     a comprehensive surveillance     program.  Test performance has     been validated by [employer] for patients greater     than or equal to 52 year old.     It is not intended     to diagnose infection nor to     guide or monitor treatment.    Medical History: Past Medical History  Diagnosis Date  . Diabetes mellitus   . Hepatitis C virus   . Neuropathy   . Glaucoma   . Bipolar 1 disorder   . CHF (congestive heart failure)    Assessment: 47 y/o F here with with CP/SOB, recently admitted from 10/13-10/20 where she underwent mid-foot amputation, has been on Levaquin PTA. To start vancomycin per Rx for L-stump cellulitis. Will aim for higher goal for now, given that x-ray could not r/o osteo. WBC 5.2, afebrile, Scr 1.75 (BL varies from 1.2-2 per recent labs).   Goal of Therapy:  Vancomycin trough level 15-20 mcg/ml  Plan:  -Vancomycin 1250 mg IV q24h -Trend WBC, temp, renal function  -F/U any new cultures (GBS from last admit) -Would check  trough ASAP with variations in Scr  Thank you for allowing me to take part in this patient's care,  Narda Bonds, PharmD Clinical Pharmacist Phone: [phone] Pager: [phone] 06/02/2013 1:17 AM

## 2013-06-02 NOTE — Progress Notes (Signed)
[facility]  Patient Status: Active (receiving services up to time of hospitalization)  [facility] is providing the following services: RN, PT, OT and MSW MSW was discussing [place] with patient. Please have hospital social worker follow up on this. Thanks.  If patient discharges after hours, please call [phone].   [name] 06/02/2013, 9:50 AM

## 2013-06-03 ENCOUNTER — Inpatient Hospital Stay (HOSPITAL_COMMUNITY): Payer: Medicare Other

## 2013-06-03 DIAGNOSIS — I10 Essential (primary) hypertension: Secondary | ICD-10-CM

## 2013-06-03 DIAGNOSIS — M7989 Other specified soft tissue disorders: Secondary | ICD-10-CM

## 2013-06-03 LAB — TYPE AND SCREEN
ABO/RH(D): O POS
Antibody Screen: NEGATIVE

## 2013-06-03 LAB — CBC WITH DIFFERENTIAL/PLATELET
Basophils Absolute: 0 10*3/uL (ref 0.0–0.1)
Eosinophils Relative: 1 % (ref 0–5)
Lymphocytes Relative: 23 % (ref 12–46)
Lymphs Abs: 1.4 10*3/uL (ref 0.7–4.0)
MCV: 85.3 fL (ref 78.0–100.0)
Monocytes Relative: 9 % (ref 3–12)
Neutro Abs: 4.2 10*3/uL (ref 1.7–7.7)
Neutrophils Relative %: 67 % (ref 43–77)
Platelets: 274 10*3/uL (ref 150–400)
RBC: 2.31 MIL/uL — ABNORMAL LOW (ref 3.87–5.11)
WBC: 6.2 10*3/uL (ref 4.0–10.5)

## 2013-06-03 LAB — GLUCOSE, CAPILLARY: Glucose-Capillary: 192 mg/dL — ABNORMAL HIGH (ref 70–99)

## 2013-06-03 LAB — BASIC METABOLIC PANEL
CO2: 28 mEq/L (ref 19–32)
Calcium: 7.8 mg/dL — ABNORMAL LOW (ref 8.4–10.5)
GFR calc Af Amer: 44 mL/min — ABNORMAL LOW (ref >90)
GFR calc non Af Amer: 38 mL/min — ABNORMAL LOW (ref >90)
Glucose, Bld: 232 mg/dL — ABNORMAL HIGH (ref 70–99)
Sodium: 137 mEq/L (ref 135–145)

## 2013-06-03 MED ORDER — SODIUM CHLORIDE 0.9 % IV SOLN
25.0000 mg | Freq: Once | INTRAVENOUS | Status: AC
  Administered 2013-06-03: 25 mg via INTRAVENOUS
  Filled 2013-06-03: qty 0.5

## 2013-06-03 MED ORDER — INSULIN GLARGINE 100 UNIT/ML ~~LOC~~ SOLN
40.0000 [IU] | Freq: Two times a day (BID) | SUBCUTANEOUS | Status: DC
  Administered 2013-06-04: 40 [IU] via SUBCUTANEOUS
  Filled 2013-06-03 (×3): qty 0.4

## 2013-06-03 MED ORDER — DARBEPOETIN ALFA-POLYSORBATE 100 MCG/0.5ML IJ SOLN
100.0000 ug | Freq: Once | INTRAMUSCULAR | Status: DC
  Filled 2013-06-03: qty 0.5

## 2013-06-03 MED ORDER — GADOBENATE DIMEGLUMINE 529 MG/ML IV SOLN
8.0000 mL | Freq: Once | INTRAVENOUS | Status: AC
  Administered 2013-06-03: 8 mL via INTRAVENOUS

## 2013-06-03 MED ORDER — LISINOPRIL 5 MG PO TABS
5.0000 mg | ORAL_TABLET | Freq: Every day | ORAL | Status: DC
  Filled 2013-06-03: qty 1

## 2013-06-03 MED ORDER — SODIUM CHLORIDE 0.9 % IV SOLN
500.0000 mg | Freq: Once | INTRAVENOUS | Status: AC
  Administered 2013-06-03: 500 mg via INTRAVENOUS
  Filled 2013-06-03: qty 10

## 2013-06-03 MED ORDER — DARBEPOETIN ALFA-POLYSORBATE 40 MCG/0.4ML IJ SOLN
40.0000 ug | Freq: Once | INTRAMUSCULAR | Status: AC
  Administered 2013-06-03: 40 ug via SUBCUTANEOUS
  Filled 2013-06-03: qty 0.4

## 2013-06-03 NOTE — Evaluation (Signed)
Physical Therapy Evaluation Patient Details Name: [name] MRN: [id number] DOB: 1966/03/17 Today's Date: 06/03/2013 Time: [id number] PT Time Calculation (min): 20 min  PT Assessment / Plan / Recommendation History of Present Illness  Patient is a 47 yo female s/p Lt midfoot amputation due to abscess with dehiscence and HTN on readmission  Clinical Impression  Pt very pleasant lady who reports she feels much better and is moving better than last admission. Pt plans to return to sister's house until she can walk on her left foot. Pt states she had HHPT and they were recommending OPPT but she does not have transportation to [place]. At this time recommmend continued HHPT and will follow acutely to address below deficits, maximize activity tolerance and gait to increase independence.     PT Assessment  Patient needs continued PT services    Follow Up Recommendations  Home health PT    Does the patient have the potential to tolerate intense rehabilitation      Barriers to Discharge Decreased caregiver support      Equipment Recommendations  None recommended by PT    Recommendations for Other Services     Frequency Min 3X/week    Precautions / Restrictions Precautions Precautions: Fall Restrictions LLE Weight Bearing: Non weight bearing   Pertinent Vitals/Pain No pain sats 98% on RA HR 104      Mobility  Bed Mobility Supine to Sit: 7: Independent;HOB flat Transfers Sit to Stand: 5: Supervision;From bed Stand to Sit: 5: Supervision;To chair/3-in-1 Details for Transfer Assistance: Verbal cues for hand placement Ambulation/Gait Ambulation/Gait Assistance: 5: Supervision Ambulation Distance (Feet): 75 Feet Assistive device: Rolling walker Ambulation/Gait Assistance Details: cues for posture, pt prefers to keep LLE out in front of her rather than knee bent. Pt encouraged to maintain supportive shoe on right to decrease impact of gait Gait Pattern: Step-to pattern Gait  velocity: WFL    Exercises     PT Diagnosis: Difficulty walking  PT Problem List: Decreased activity tolerance;Decreased balance;Decreased mobility;Decreased knowledge of use of DME PT Treatment Interventions: DME instruction;Gait training;Functional mobility training;Therapeutic activities;Patient/family education;Therapeutic exercise     PT Goals(Current goals can be found in the care plan section) Acute Rehab PT Goals Patient Stated Goal: pt wants to be able to walk to go apartment hunting PT Goal Formulation: With patient Time For Goal Achievement: 06/10/13 Potential to Achieve Goals: Good  Visit Information  Last PT Received On: 06/03/13 Assistance Needed: +1 History of Present Illness: Patient is a 47 yo female s/p Lt midfoot amputation due to abscess with dehiscence and HTN on readmission       Prior [place] expects to be discharged to:: Private residence Living Arrangements: Other relatives Available Help at Discharge: Available PRN/intermittently;Family Type of Home: House Home Access: Level entry Home Layout: One level Home Equipment: Walker - 2 wheels Prior Function Level of Independence: Independent with assistive device(s) Comments: sister has been doing the housework and cooking since her amputation. Pt states she fell out of the [place] after D/C home Communication Communication: No difficulties    Cognition  Cognition Arousal/Alertness: Awake/alert Behavior During Therapy: WFL for tasks assessed/performed Overall Cognitive Status: Within Functional Limits for tasks assessed    Extremity/Trunk Assessment Upper Extremity Assessment Upper Extremity Assessment: Overall WFL for tasks assessed Lower Extremity Assessment Lower Extremity Assessment: Overall WFL for tasks assessed RLE Sensation: history of peripheral neuropathy LLE Sensation: history of peripheral neuropathy   Balance    End of Session PT -  End of  Session Equipment Utilized During Treatment: Gait belt Activity Tolerance: Patient tolerated treatment well Patient left: in chair;with call Veronica/phone within reach;with nursing/sitter in room Nurse Communication: Mobility status  GP     Melford Aase 06/03/2013, 2:29 PM Elwyn Reach, [place]

## 2013-06-03 NOTE — Progress Notes (Signed)
VASCULAR LAB PRELIMINARY  PRELIMINARY  PRELIMINARY  PRELIMINARY  Left lower extremity venous Doppler completed.    Preliminary report:  There is no DVT or SVT noted in the left lower extremity.  Latosha Gaylord, RVT 06/03/2013, 12:16 PM

## 2013-06-03 NOTE — Progress Notes (Signed)
Pt iv infiltrated while infusing iv iron.   IV team paged for restart.

## 2013-06-03 NOTE — Progress Notes (Signed)
[facility] PROGRESS NOTE  Assessment/Plan: Hypertension, accelerated - Cont coreg.  - Echo 10.29.2014: ejection fraction range of 60% to 65%. grade 1 diastolic dysfunction. - Cont HCTZ will taker 3 week to see full effect.   Chest pain - resolved, cardiac marker negative, echo no wall motion abnormality.  Normocytic anemia: - Hbg low < 7.0. Most likely anemia of chronic disease. - give IV iron and aranesp  Diabetes mellitus - Blood glucose cont to be high increase Lantus. - cont SSI. - check Micro/albumin to Cr ratio.  Lower extremity edema  - ? Due to surgery vs infection. ECHO: showed diastolic dysfunction, but this would not explaing severity of her lower extremity edema. - MRI pending. Rule out infectious etiology. On empiric vanc. - added low dose HCTZ.  CKD (chronic kidney disease) stage 3, GFR 30-59 ml/min - at baseline. - resume BP medications.  Seizure disorder - none.    Code Status: Full code.  Family Communication: None.  Disposition Plan: Admit to inpatient.     Consultants:  ORthopedics  Procedures:  MRI   Antibiotics:  Vancomycin 10.29.2014  HPI/Subjective: Complaining of leg pain.  Objective: Filed Vitals:   06/02/13 1058 06/02/13 1300 06/02/13 2008 06/03/13 0506  BP: 139/70 163/81 165/89 161/88  Pulse: 88 85 91 87  Temp: 97.7 F (36.5 C) 98.3 F (36.8 C) 98.2 F (36.8 C) 98.1 F (36.7 C)  TempSrc: Oral Oral Oral Oral  Resp: 20 20 18 18   Height:      Weight:    83.779 kg (184 lb 11.2 oz)  SpO2: 100% 99% 99% 100%    Intake/Output Summary (Last 24 hours) at 06/03/13 1041 Last data filed at 06/03/13 0922  Gross per 24 hour  Intake    480 ml  Output   1500 ml  Net  -1020 ml   Filed Weights   06/01/13 1615 06/02/13 0107 06/03/13 0506  Weight: 89.812 kg (198 lb) 83.28 kg (183 lb 9.6 oz) 83.779 kg (184 lb 11.2 oz)    Exam:  General: Alert, awake, oriented x3, in no acute distress.  HEENT: No bruits, no goiter.   Heart: Regular rate and rhythm, without murmurs, rubs, gallops.  Lungs: Good air movement, clear to auscultation. Abdomen: Soft, nontender, nondistended, positive bowel sounds.  Neuro: Grossly intact, nonfocal.   Data Reviewed: Basic Metabolic Panel:  Recent Labs Lab 06/01/13 1619 06/02/13 0223 06/03/13 0346  NA 134* 133* 137  K 4.5 4.2 4.5  CL 98 98 101  CO2 28 29 28   GLUCOSE 303* 434* 232*  BUN 14 13 17   CREATININE 1.75* 1.68* 1.57*  CALCIUM 8.4 8.2* 7.8*   Liver Function Tests:  Recent Labs Lab 06/01/13 1619  AST 27  ALT 13  ALKPHOS 121*  BILITOT 0.2*  PROT 7.5  ALBUMIN 1.8*   No results found for this basename: LIPASE, AMYLASE,  in the last 168 hours No results found for this basename: AMMONIA,  in the last 168 hours CBC:  Recent Labs Lab 06/01/13 1619 06/02/13 0223 06/03/13 0346  WBC 5.2 4.6 6.2  NEUTROABS  --   --  4.2  HGB 7.5* 7.0* 6.4*  HCT 22.6* 20.7* 19.7*  MCV 85.0 84.8 85.3  PLT 320 280 274   Cardiac Enzymes:  Recent Labs Lab 06/02/13 0223 06/02/13 0732 06/02/13 1315  TROPONINI <0.30 <0.30 <0.30   BNP (last 3 results)  Recent Labs  06/01/13 1619  PROBNP 1210.0*   CBG:  Recent Labs Lab 06/02/13 0159 06/02/13  0730 06/02/13 1211 06/02/13 1613 06/03/13 0645  GLUCAP 393* 377* 197* 259* 192*    No results found for this or any previous visit (from the past 240 hour(s)).   Studies: Dg Chest 2 View  06/01/2013   CLINICAL DATA:  Chest pain  EXAM: CHEST  2 VIEW  COMPARISON:  05/17/2013  FINDINGS: Cardiomediastinal silhouette is stable. No pulmonary edema. There is small right pleural effusion with right basilar atelectasis or infiltrate. Bony thorax is unremarkable.  IMPRESSION: No pulmonary edema. Small right pleural effusion with right basilar atelectasis or infiltrate.   Electronically Signed   By: Lahoma Crocker M.D.   On: 06/01/2013 17:10   Nm Pulmonary Perf And Vent  06/01/2013   CLINICAL DATA:  Pulmonary embolism. Chest  pain. Recent operation.  EXAM: NUCLEAR MEDICINE VENTILATION - PERFUSION LUNG SCAN  TECHNIQUE: Ventilation images were obtained in multiple projections using inhaled aerosol technetium 99 M DTPA. Perfusion images were obtained in multiple projections after intravenous injection of Tc-72m MAA.  COMPARISON:  Chest radiograph 06/01/2013.  RADIOPHARMACEUTICALS:  Forty mCi Tc-70m DTPA aerosol and 6 mCi Tc-61m MAA  FINDINGS: Ventilation: Patchy ventilation is present. Some of the radiotracer was swallowed and is present in the stomach. Particulate radiotracer outlines the trachea.  Perfusion: The perfusion study is normal.  IMPRESSION: Normal perfusion study.  No findings of pulmonary embolism.   Electronically Signed   By: Dereck Ligas M.D.   On: 06/01/2013 21:30   Dg Foot 2 Views Left  06/02/2013   CLINICAL DATA:  Left foot pain and stump drainage.  EXAM: LEFT FOOT - 2 VIEW  COMPARISON:  MRI of the left foot May 19, 2013  FINDINGS: Status post transmetatarsal amputation, minimal osteopenia lung the lateral aspect of the medial cuneiform seen only on the frontal radiograph. No fracture deformity or dislocation. Type 2 navicular bone. Mild irregularity of the stomach soft tissues Common a radiopaque foreign bodies ; ankle soft tissue swelling.  IMPRESSION: Status post transmetatarsal amputation, with expected postoperative changes; slight lucency along the lateral aspect of the medial cuneiform, which may be projectional though, if clinical concern for recurrent osteomyelitis consider repeat MRI of the foot. No acute fracture deformity or dislocation.   Electronically Signed   By: Elon Alas   On: 06/02/2013 00:50    Scheduled Meds: . aspirin EC  325 mg Oral Daily  . carvedilol  6.25 mg Oral BID WC  . darbepoetin (ARANESP) injection - NON-DIALYSIS  40 mcg Subcutaneous Once  . DULoxetine  60 mg Oral Daily  . feeding supplement (GLUCERNA SHAKE)  237 mL Oral BID BM  . hydrochlorothiazide  12.5 mg  Oral Daily  . insulin aspart  0-9 Units Subcutaneous TID WC  . insulin glargine  30 Units Subcutaneous BID  . iron dextran (INFED/DEXFERRUM) infusion  25 mg Intravenous Once   Followed by  . iron dextran (INFED/DEXFERRUM) infusion  500 mg Intravenous Once  . levETIRAcetam  1,000 mg Oral QHS  . pneumococcal 23 valent vaccine  0.5 mL Intramuscular Tomorrow-1000  . polyethylene glycol  17 g Oral Daily  . senna-docusate  2 tablet Oral QHS  . sodium chloride  3 mL Intravenous Q12H  . sodium chloride  3 mL Intravenous Q12H  . vancomycin  1,250 mg Intravenous Q24H  . vitamin C  500 mg Oral Daily   Continuous Infusions:    Charlynne Cousins  [facility] Pager [phone]. If 8PM-8AM, please contact night-coverage at [email or web address], password [facility] 06/03/2013, 10:41 AM  LOS: 2 days

## 2013-06-03 NOTE — Progress Notes (Signed)
CRITICAL VALUE ALERT  Critical value received:  Hb 6.9  Date of notification:  06/03/2013  Time of notification:  0518  Critical value read back:yes  Nurse who received alert:  Suzette Battiest  MD notified (1st page):  Tylene Fantasia, NP  Time of first page:  0520  MD notified (2nd page):  Time of second page:  Responding MD:    Time MD responded:  New orders placed by hospitalist on call.

## 2013-06-04 ENCOUNTER — Encounter (HOSPITAL_COMMUNITY): Payer: Self-pay | Admitting: General Practice

## 2013-06-04 DIAGNOSIS — I10 Essential (primary) hypertension: Secondary | ICD-10-CM

## 2013-06-04 DIAGNOSIS — N179 Acute kidney failure, unspecified: Secondary | ICD-10-CM

## 2013-06-04 LAB — MICROALBUMIN / CREATININE URINE RATIO
Creatinine, Urine: 41.3 mg/dL
Microalb, Ur: 138.06 mg/dL — ABNORMAL HIGH (ref 0.00–1.89)

## 2013-06-04 LAB — GLUCOSE, CAPILLARY: Glucose-Capillary: 116 mg/dL — ABNORMAL HIGH (ref 70–99)

## 2013-06-04 MED ORDER — HYDROCHLOROTHIAZIDE 12.5 MG PO CAPS: 12.5000 mg | ORAL_CAPSULE | Freq: Every day | ORAL | Status: DC

## 2013-06-04 MED ORDER — INSULIN GLARGINE 100 UNIT/ML ~~LOC~~ SOLN: 40.0000 [IU] | Freq: Two times a day (BID) | SUBCUTANEOUS | Status: DC

## 2013-06-04 MED ORDER — OXYCODONE-ACETAMINOPHEN 5-325 MG PO TABS: 1.0000 | ORAL_TABLET | ORAL | Status: DC | PRN

## 2013-06-04 NOTE — Evaluation (Signed)
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 Pt will have 24 hour assist prn at home. NO further acute OT services indicated at this time, all education completed    OT Assessment  All further OT needs can be met in the next venue of care    Follow Up Recommendations  Home health OT    Barriers to Discharge  None    Equipment Recommendations   shower chair   Recommendations for Other Services    Frequency       Precautions / Restrictions Precautions Precautions: Fall Required Braces or Orthoses: Other Brace/Splint Other Brace/Splint: Post-op shoe Restrictions Weight Bearing Restrictions: Yes LLE Weight Bearing: Non weight bearing   Pertinent Vitals/Pain 5/10    ADL  Grooming: Performed;Wash/dry hands;Wash/dry face;Min guard;Minimal assistance Where Assessed - Grooming: Supported standing Upper Body Bathing: Simulated;Supervision/safety;Set up Lower Body Bathing: Simulated;Minimal assistance Upper Body Dressing: Performed;Supervision/safety;Set up Lower Body Dressing: Performed;Minimal assistance Toilet Transfer: Performed;Supervision/safety;Min guard Toilet Transfer Method: Sit to stand Toilet Transfer Equipment: Regular height toilet;Grab bars Toileting - Clothing Manipulation and Hygiene: Performed;Minimal assistance Where Assessed - Camera operator Manipulation and Hygiene: Standing Tub/Shower Transfer Method: Not assessed Equipment Used: Gait belt;Rolling walker Transfers/Ambulation Related to ADLs: cues fro correct hand placement,  technique ADL Comments: educated on compensatory, safe techniques for UB ADLs    OT Diagnosis: Acute pain  OT Problem List: Decreased knowledge of use of DME or AE;Pain;Impaired balance (sitting and/or standing) OT Treatment Interventions:     OT Goals(Current goals can be found in the care plan section) Acute Rehab OT Goals Patient Stated Goal: pt wants to be able to walk to go apartment hunting  Visit Information  Last OT Received On: 06/04/13 Assistance Needed: +1 History of Present Illness: Patient is a 47 yo female 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 Pt states she fell out of the [place] after D/C home Communication Communication: No difficulties Dominant Hand: Left         Vision/Perception Vision - History Baseline Vision: No visual deficits Patient Visual Report: No change from baseline Perception Perception: Within Functional Limits   Cognition  Cognition Arousal/Alertness: Awake/alert Behavior During Therapy: WFL for tasks assessed/performed Overall Cognitive Status: Within Functional Limits for tasks assessed    Extremity/Trunk Assessment Upper Extremity Assessment Upper Extremity Assessment: Overall WFL for tasks assessed Lower Extremity Assessment Lower Extremity Assessment: Defer to PT evaluation Cervical / Trunk Assessment Cervical / Trunk Assessment: Normal     Mobility Bed Mobility Bed Mobility:  Supine to Sit;Sit to Supine;Sitting - Scoot to Edge of Bed;Scooting to [facility] Supine to Sit:  7: Independent;HOB flat Sitting - Scoot to Edge of Bed: 7: Independent Sit to Supine: 7: Independent Scooting to HOB: 7: Independent Transfers Transfers: Sit to Stand;Stand to Sit Sit to Stand: 5: Supervision;From bed;From chair/3-in-1;From toilet Stand to Sit: 5: Supervision;To chair/3-in-1;To bed;To toilet Details for Transfer Assistance: Verbal cues for hand placement, technique     Exercise     Balance Balance Balance Assessed: Yes Static Standing Balance Static Standing - Balance Support: Left upper extremity supported;During functional activity Static Standing - Level of Assistance: 5: Stand by assistance Dynamic Standing Balance Dynamic Standing - Balance Support: Left upper extremity supported;During functional activity Dynamic Standing - Level of Assistance: 4: Min assist   End of Session OT - End of Session Equipment Utilized During Treatment: Gait belt;Rolling walker Activity Tolerance: Patient tolerated treatment well Patient left: in bed;Other (comment) (sitting EOB)  GO     Britt Bottom 06/04/2013, 12:21 PM

## 2013-06-04 NOTE — Discharge Summary (Addendum)
Physician Discharge Summary  [name] [id number] DOB: 1965-09-17 DOA: 06/01/2013  PCP: Maurice Small, D, MD  Admit date: 06/01/2013 Discharge date: 06/04/2013  Time spent: 35 minutes  Recommendations for Outpatient Follow-up:  1. Follow up with PCP b-met titrate Bp medications as tolerate it. 2. Cbc in 6 weeks.  Discharge Diagnoses:  Principal Problem:   Hypertension, accelerated Active Problems:   Seizure disorder   CKD (chronic kidney disease) stage 3, GFR 30-59 ml/min   Chest pain   Type II or unspecified type diabetes mellitus with peripheral circulatory disorders, uncontrolled(250.72)   Edema   Normocytic anemia   Discharge Condition: stable  Diet recommendation: carb modified  Filed Weights   06/01/13 1615 06/02/13 0107 06/03/13 0506  Weight: 89.812 kg (198 lb) 83.28 kg (183 lb 9.6 oz) 83.779 kg (184 lb 11.2 oz)    History of present illness:  47 y.o. female with known history of diabetes mellitus type 2, hypertension, seizure disorder, hepatitis C who has had a recent left foot transmetatarsal amputation for cellulitis and abscess presents to the ER because of chest pain. Patient states that patient has been experiencing chest pain for last few days which was retrosternal nonradiating. Patient also has noticed increasing swelling of the lower extremities over the last one week. Patient gets short of breath on lying flat and on exertion. Patient had followed up with her PCP yesterday and was found to have elevated blood pressure and was referred to the ER. In the ER patient had VQ scan which was negative for PE. Patient was found to have elevated blood pressure and was initially given clonidine followed by Lasix 60 mg IV and labetalol 20 mg IV. Cardiac markers and EKG were unremarkable. Patient has been admitted for further management. Patient denies any nausea vomiting abdominal pain diarrhea. Patient states her left leg wound has given way and has some discharge.  Initial labs shows worsening of her creatinine.   Hospital Course:  Hypertension, accelerated  - Cont coreg and ARB. Added HCTZ - Echo 10.29.2014: ejection fraction range of 60% to 65%. grade 1 diastolic dysfunction.  - will need a b-met in 1 week.  Chest pain  - resolved, cardiac marker negative, echo no wall motion abnormality.   Normocytic anemia:  - Hbg low < 7.0. Most likely anemia of chronic disease.  - given IV iron and aranesp. Will repeat in 1 week by home health - cbc in 6 weeks.  Diabetes mellitus  - Blood glucose high good controlled with 40 units of lantus BID. Plus  SSI.  - 3 gr on Micro/albumin to Cr ratio.  Lower extremity edema  - MRI showed no infection, remained afebrile.  ECHO: showed diastolic dysfunction, but this would not explaing severity of her lower extremity edema.  - doppler no DVT.  CKD (chronic kidney disease) stage 3, GFR 30-59 ml/min  - at baseline.  - resume BP medications.   Seizure disorder  - none.   Procedures:  MRI  Doppler  ECHO  Consultations:  none  Discharge Exam: Filed Vitals:   06/04/13 0945  BP: 150/89  Pulse: 86  Temp:   Resp:     General: A&O x3 Cardiovascular: RRR Respiratory: good air movement CTA b/l  Discharge Instructions      Discharge Orders   Future Orders Complete By Expires   Diet - low sodium heart healthy  As directed    Increase activity slowly  As directed        Medication  List    STOP taking these medications       levofloxacin 750 MG tablet  Commonly known as:  LEVAQUIN      TAKE these medications       carvedilol 6.25 MG tablet  Commonly known as:  COREG  Take 1 tablet (6.25 mg total) by mouth 2 (two) times daily with a meal.     DULoxetine 60 MG capsule  Commonly known as:  CYMBALTA  Take 60 mg by mouth daily.     feeding supplement (GLUCERNA SHAKE) Liqd  Take 237 mLs by mouth 2 (two) times daily between meals.     hydrochlorothiazide 12.5 MG capsule  Commonly  known as:  MICROZIDE  Take 1 capsule (12.5 mg total) by mouth daily.     insulin aspart 100 UNIT/ML injection  Commonly known as:  novoLOG  Inject 2-20 Units into the skin 3 (three) times daily before meals. Sliding scale     insulin glargine 100 UNIT/ML injection  Commonly known as:  LANTUS  Inject 0.4 mLs (40 Units total) into the skin 2 (two) times daily.     levETIRAcetam 1000 MG tablet  Commonly known as:  KEPPRA  Take 1,000 mg by mouth at bedtime.     MAGNESIUM PO  Take 1 tablet by mouth daily.     oxyCODONE-acetaminophen 5-325 MG per tablet  Commonly known as:  PERCOCET/ROXICET  Take 1-2 tablets by mouth every 4 (four) hours as needed.     polyethylene glycol packet  Commonly known as:  MIRALAX / GLYCOLAX  Take 17 g by mouth daily.     senna-docusate 8.6-50 MG per tablet  Commonly known as:  Senokot-S  Take 2 tablets by mouth at bedtime.     vitamin C 500 MG tablet  Commonly known as:  ASCORBIC ACID  Take 500 mg by mouth daily.     zolpidem 6.25 MG CR tablet  Commonly known as:  AMBIEN CR  Take 6.25 mg by mouth at bedtime as needed for sleep.       Allergies  Allergen Reactions  . Gabapentin Itching and Swelling  . Penicillins Hives   Follow-up Information   Follow up with WEBB, CAROL, D, MD In 1 week. (hospital follow up)    Specialty:  Family Medicine   Contact information:   [address] [place] [address] [phone]        The results of significant diagnostics from this hospitalization (including imaging, microbiology, ancillary and laboratory) are listed below for reference.    Significant Diagnostic Studies: Dg Chest 2 View  06/01/2013   CLINICAL DATA:  Chest pain  EXAM: CHEST  2 VIEW  COMPARISON:  05/17/2013  FINDINGS: Cardiomediastinal silhouette is stable. No pulmonary edema. There is small right pleural effusion with right basilar atelectasis or infiltrate. Bony thorax is unremarkable.  IMPRESSION: No pulmonary  edema. Small right pleural effusion with right basilar atelectasis or infiltrate.   Electronically Signed   By: Lahoma Crocker M.D.   On: 06/01/2013 17:10   Dg Chest 2 View  05/17/2013   CLINICAL DATA:  hypertension and dyspnea on exertion  EXAM: CHEST  2 VIEW  COMPARISON:  February 09, 2013  FINDINGS: The lungs are clear. Heart size and pulmonary vascularity are normal. No adenopathy. No bone lesions. There are small cervical ribs bilaterally.  IMPRESSION: No edema or consolidation. Small cervical ribs bilaterally.   Electronically Signed   By: Lowella Grip M.D.   On:  05/17/2013 21:23   Mr Foot Left Wo Contrast  05/19/2013   CLINICAL DATA:  Cellulitis with soft tissue ulcerations of the mid foot on the dorsal and plantar aspects between the 3rd and 4th rays.  EXAM: MRI OF THE LEFT FOREFOOT WITHOUT CONTRAST  TECHNIQUE: Multiplanar, multisequence MR imaging was performed. No intravenous contrast was administered.  COMPARISON:  Radiographs dated 05/17/2013  FINDINGS: There is a osteomyelitis of the proximal phalanx of the 4th toe with some destruction of the medial cortex of the midshaft. There is also effusion at the 4th metatarsal phalangeal joint consistent with a septic joint.  The soft tissue ulcers are noted on the dorsal and plantar aspects of the foot at approximately the level of the metatarsal phalangeal joints between the 3rd and 4th rays. There are poorly defined fluid collections in the soft tissues deep to these ulcerations. The dorsal pus collection extends approximately 2 cm proximal to the 4th metatarsal phalangeal joint. There is no more proximal extension of pus in the foot. There is diffuse subcutaneous edema primarily on the dorsum of the foot.  The tendons and muscle structures demonstrate no acute abnormalities.  IMPRESSION: 1. 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 2. Cellulitis of the forefoot primarily around the 3rd and 4th metatarsal phalangeal joints. Small poorly  defined pus collections deep to the dorsal and plantar ulcers. No proximal soft tissue abscesses.   Electronically Signed   By: Rozetta Nunnery M.D.   On: 05/19/2013 11:01   Mr Foot Left W Wo Contrast  06/03/2013   CLINICAL DATA:  Left foot swelling following amputation for osteomyelitis earlier this month. Patient fell injuring her foot after surgery. Renal insufficiency.  EXAM: MRI OF THE LEFT FOREFOOT WITHOUT AND WITH CONTRAST  TECHNIQUE: Multiplanar, multisequence MR imaging was performed both before and after administration of intravenous contrast.  CONTRAST:  45mL MULTIHANCE GADOBENATE DIMEGLUMINE 529 MG/ML IV SOLN  COMPARISON:  Radiographs 06/02/2013. Preoperative MRI 05/19/2013.  FINDINGS: Study is mildly motion degraded. The remaining foot is imaged.  The patient has undergone interval transmetatarsal amputation as demonstrated on the recent radiographs. There is a complex peripherally enhancing fluid collection within the surgical bed, measuring approximately 6.3 x 2.6 x 2.5 cm. Enhancing fluid tracks proximally along the extensor tendon sheaths. No other focal fluid collections are identified. There is diffuse subcutaneous edema within the ankle and hindfoot.  There is nonspecific low level edema within the amputated metatarsal bases. There is low-level edema and enhancement within the cuboid and cuneiform bones. No cortical destruction is identified. The talus, navicular and calcaneus appear normal.  IMPRESSION: 1. Postsurgical changes status post recent transmetatarsal amputation. 2. There is a peripheral enhancing fluid collection within the surgical bed which is nonspecific this soon following surgery. This could reflect a postoperative hematoma/ seroma. An abscess cannot be excluded. 3. Nonspecific low-level marrow edema and enhancement within the partially amputated metatarsals as well as the cuboid and cuneiform bones. Some degree of hyperemia in these bones is expected related to the recent  surgery, and there is no cortical destruction to strongly suggest recurrent osteomyelitis.   Electronically Signed   By: Camie Patience M.D.   On: 06/03/2013 16:28   Nm Pulmonary Perf And Vent  06/01/2013   CLINICAL DATA:  Pulmonary embolism. Chest pain. Recent operation.  EXAM: NUCLEAR MEDICINE VENTILATION - PERFUSION LUNG SCAN  TECHNIQUE: Ventilation images were obtained in multiple projections using inhaled aerosol technetium 99 M DTPA. Perfusion images were obtained in multiple projections  after intravenous injection of Tc-22m MAA.  COMPARISON:  Chest radiograph 06/01/2013.  RADIOPHARMACEUTICALS:  Forty mCi Tc-12m DTPA aerosol and 6 mCi Tc-39m MAA  FINDINGS: Ventilation: Patchy ventilation is present. Some of the radiotracer was swallowed and is present in the stomach. Particulate radiotracer outlines the trachea.  Perfusion: The perfusion study is normal.  IMPRESSION: Normal perfusion study.  No findings of pulmonary embolism.   Electronically Signed   By: Dereck Ligas M.D.   On: 06/01/2013 21:30   Dg Abd 2 Views  05/21/2013   CLINICAL DATA:  Abdominal pain with vomiting today.  EXAM: ABDOMEN - 2 VIEW  COMPARISON:  None.  FINDINGS: The bowel gas pattern is normal. There is no evidence of free air. No radio-opaque calculi or other significant radiographic abnormality is seen.  IMPRESSION: Negative.   Electronically Signed   By: Rolla Flatten M.D.   On: 05/21/2013 01:16   Dg Foot 2 Views Left  06/02/2013   CLINICAL DATA:  Left foot pain and stump drainage.  EXAM: LEFT FOOT - 2 VIEW  COMPARISON:  MRI of the left foot May 19, 2013  FINDINGS: Status post transmetatarsal amputation, minimal osteopenia lung the lateral aspect of the medial cuneiform seen only on the frontal radiograph. No fracture deformity or dislocation. Type 2 navicular bone. Mild irregularity of the stomach soft tissues Common a radiopaque foreign bodies ; ankle soft tissue swelling.  IMPRESSION: Status post transmetatarsal  amputation, with expected postoperative changes; slight lucency along the lateral aspect of the medial cuneiform, which may be projectional though, if clinical concern for recurrent osteomyelitis consider repeat MRI of the foot. No acute fracture deformity or dislocation.   Electronically Signed   By: Elon Alas   On: 06/02/2013 00:50   Dg Foot Complete Left  05/17/2013   CLINICAL DATA:  Left foot pain and swelling. Plantar ulcer. Diabetes.  EXAM: LEFT FOOT - COMPLETE 3+ VIEW  COMPARISON:  None.  FINDINGS: There is no evidence of fracture or dislocation. There is no evidence of arthropathy or other focal bone abnormality. No evidence of periostitis or osteolysis. No evidence of soft tissue gas. Small plantar calcaneal bone spur noted as well as mild peripheral vascular calcification.  IMPRESSION: No acute findings.  Small plantar calcaneal bone spur and mild peripheral vascular calcification.   Electronically Signed   By: Earle Gell M.D.   On: 05/17/2013 21:23    Microbiology: No results found for this or any previous visit (from the past 240 hour(s)).   Labs: Basic Metabolic Panel:  Recent Labs Lab 06/01/13 1619 06/02/13 0223 06/03/13 0346  NA 134* 133* 137  K 4.5 4.2 4.5  CL 98 98 101  CO2 28 29 28   GLUCOSE 303* 434* 232*  BUN 14 13 17   CREATININE 1.75* 1.68* 1.57*  CALCIUM 8.4 8.2* 7.8*   Liver Function Tests:  Recent Labs Lab 06/01/13 1619  AST 27  ALT 13  ALKPHOS 121*  BILITOT 0.2*  PROT 7.5  ALBUMIN 1.8*   No results found for this basename: LIPASE, AMYLASE,  in the last 168 hours No results found for this basename: AMMONIA,  in the last 168 hours CBC:  Recent Labs Lab 06/01/13 1619 06/02/13 0223 06/03/13 0346  WBC 5.2 4.6 6.2  NEUTROABS  --   --  4.2  HGB 7.5* 7.0* 6.4*  HCT 22.6* 20.7* 19.7*  MCV 85.0 84.8 85.3  PLT 320 280 274   Cardiac Enzymes:  Recent Labs Lab 06/02/13 0223 06/02/13 0732  06/02/13 1315  TROPONINI <0.30 <0.30 <0.30    BNP: BNP (last 3 results)  Recent Labs  06/01/13 1619  PROBNP 1210.0*   CBG:  Recent Labs Lab 06/03/13 0645 06/03/13 1130 06/03/13 1608 06/03/13 2155 06/04/13 0643  GLUCAP 192* 192* 138* 78 116*       Signed:  FELIZ ORTIZ, Mcadoo Muzquiz  [facility] 06/04/2013, 11:57 AM

## 2013-06-04 NOTE — Progress Notes (Signed)
Physical medicine and rehabilitation consult requested. Physical therapy evaluation completed 06/03/2013 with patient ambulating 75 feet supervision using a rolling walker and recommendations for home health therapies. At this time patient will not require inpatient rehabilitation services.Hold On formal rehabilitation consult at this time

## 2013-06-04 NOTE — Progress Notes (Signed)
Patient evaluated for community based chronic disease management services with [facility] as a benefit of patient's [employer]. Spoke with patient at bedside to explain [facility] services.  Written consents obtained.  The best number to reach the patient is [phone] or [phone].  These numbers are from free government phones and are subject to changes.  Patient will reach out to our office if the number changes post discharge.  She has multiple comorbidities that have been unmanaged since her relocation here from [place].  She lives with her sister but she does not provide her with relevant assistance.  Reached out to Dr Maurice Small at [facility] to make her aware that the patient has a history Bipolar Disorder.  Spoke with Colletta Maryland CMA who will relay the message.  Made office aware that [facility] will be attempting to engage post discharge.  Our Patient will receive a post discharge transition of care call and will be evaluated for monthly home visits for assessments and disease process education.  Left contact information and [facility] literature at bedside. Made Inpatient Case Manager aware that [facility] following. Of note, [facility] services does not replace or interfere with any services that are arranged by inpatient case management or social work.  For additional questions or referrals please contact Corliss Blacker BSN RN [facility] Liaison at [phone].

## 2013-06-04 NOTE — Progress Notes (Signed)
Patient is alert and oriented x3, no complaints of pain and is stable; will continue to monitor patient. Ruben Im RN

## 2013-06-04 NOTE — Progress Notes (Signed)
Patient's IV and telemetry has been discontinued, patient verbalizes understanding of discharge instructions and will be discharged home with sister. Ruben Im RN

## 2013-06-04 NOTE — Care Management Note (Addendum)
  Page 2 of 2   06/04/2013     2:05:27 PM   CARE MANAGEMENT NOTE 06/04/2013  Patient:  [name], [name]   Account Number:  [id number]  Date Initiated:  06/04/2013  Documentation initiated by:  [facility]  Subjective/Objective Assessment:   47 y.o. female with known history of DM type 2, hypertension, seizure disorder, hepatitis C who has had a recent left foot transmetatarsal amputation for cellulitis and abscess presents to the ER with chest pain. / Hm with sister     Action/Plan:   clonidine followed by Lasix 60 mg IV and labetalol 20 mg IV. Cardiac markers and EKG were unremarkable. Patient has been admitted for further management./ Hm with Home Health   Anticipated DC Date:  06/04/2013   Anticipated DC Plan:  [place] referral  Clinical Social Worker      DC Forensic scientist  CM consult      [facility] Choice  HOME HEALTH  Resumption Of Svcs/PTA Provider   Choice offered to / List presented to:  C-1 Patient        [place] arranged  HH-1 RN  [place].   Status of service:  Completed, signed off Medicare Important Message given?   (If response is "NO", the following Medicare IM given date fields will be blank) Date Medicare IM given:   Date Additional Medicare IM given:    Discharge Disposition:  [place]  Per UR Regulation:    If discussed at Long Length of Stay Meetings, dates discussed:    Comments:  06/04/13 Fuller Mandril, RN, BSN, NCM [phone] Spoke to pt at bedside regarding discharge planning.  Pt surrently active with [facility] for services of RN, PT and OT as confirmed with Janae Sauce of [place].  Resumption of care orders put in by Dr. Olevia Bowens. No DME needs identified at this time.

## 2013-06-05 NOTE — ED Provider Notes (Signed)
Medical screening examination/treatment/procedure(s) were performed by non-physician practitioner and as supervising physician I was immediately available for consultation/collaboration.  EKG Interpretation     Ventricular Rate:  95 PR Interval:  152 QRS Duration: 82 QT Interval:  366 QTC Calculation: 459 R Axis:   67 Text Interpretation:  Normal sinus rhythm Normal ECG             Merdis Snodgrass R. Alvino Chapel, MD 06/05/13 1551

## 2013-06-17 ENCOUNTER — Encounter (HOSPITAL_COMMUNITY): Payer: Self-pay | Admitting: Emergency Medicine

## 2013-06-17 ENCOUNTER — Emergency Department (HOSPITAL_COMMUNITY): Payer: Medicare Other

## 2013-06-17 ENCOUNTER — Emergency Department (HOSPITAL_COMMUNITY)
Admission: EM | Admit: 2013-06-17 | Discharge: 2013-06-17 | Disposition: A | Discharge status: Home / Self Care | Payer: Medicare Other | Source: Home / Self Care | Attending: Emergency Medicine | Admitting: Emergency Medicine

## 2013-06-17 DIAGNOSIS — Z87891 Personal history of nicotine dependence: Secondary | ICD-10-CM | POA: Insufficient documentation

## 2013-06-17 DIAGNOSIS — Z880 Allergy status to penicillin: Secondary | ICD-10-CM | POA: Insufficient documentation

## 2013-06-17 DIAGNOSIS — Y849 Medical procedure, unspecified as the cause of abnormal reaction of the patient, or of later complication, without mention of misadventure at the time of the procedure: Secondary | ICD-10-CM | POA: Insufficient documentation

## 2013-06-17 DIAGNOSIS — I509 Heart failure, unspecified: Secondary | ICD-10-CM | POA: Insufficient documentation

## 2013-06-17 DIAGNOSIS — T8131XA Disruption of external operation (surgical) wound, not elsewhere classified, initial encounter: Secondary | ICD-10-CM

## 2013-06-17 DIAGNOSIS — T814XXA Infection following a procedure, initial encounter: Secondary | ICD-10-CM

## 2013-06-17 DIAGNOSIS — D649 Anemia, unspecified: Secondary | ICD-10-CM | POA: Insufficient documentation

## 2013-06-17 DIAGNOSIS — T8140XA Infection following a procedure, unspecified, initial encounter: Secondary | ICD-10-CM | POA: Insufficient documentation

## 2013-06-17 DIAGNOSIS — E1149 Type 2 diabetes mellitus with other diabetic neurological complication: Secondary | ICD-10-CM | POA: Insufficient documentation

## 2013-06-17 DIAGNOSIS — Z794 Long term (current) use of insulin: Secondary | ICD-10-CM | POA: Insufficient documentation

## 2013-06-17 DIAGNOSIS — Z79899 Other long term (current) drug therapy: Secondary | ICD-10-CM | POA: Insufficient documentation

## 2013-06-17 DIAGNOSIS — N189 Chronic kidney disease, unspecified: Secondary | ICD-10-CM | POA: Insufficient documentation

## 2013-06-17 DIAGNOSIS — Z8673 Personal history of transient ischemic attack (TIA), and cerebral infarction without residual deficits: Secondary | ICD-10-CM | POA: Insufficient documentation

## 2013-06-17 DIAGNOSIS — F319 Bipolar disorder, unspecified: Secondary | ICD-10-CM | POA: Insufficient documentation

## 2013-06-17 DIAGNOSIS — R739 Hyperglycemia, unspecified: Secondary | ICD-10-CM

## 2013-06-17 DIAGNOSIS — I129 Hypertensive chronic kidney disease with stage 1 through stage 4 chronic kidney disease, or unspecified chronic kidney disease: Secondary | ICD-10-CM | POA: Insufficient documentation

## 2013-06-17 DIAGNOSIS — E1142 Type 2 diabetes mellitus with diabetic polyneuropathy: Secondary | ICD-10-CM | POA: Insufficient documentation

## 2013-06-17 DIAGNOSIS — Z8619 Personal history of other infectious and parasitic diseases: Secondary | ICD-10-CM | POA: Insufficient documentation

## 2013-06-17 LAB — CBC WITH DIFFERENTIAL/PLATELET
Eosinophils Absolute: 0.1 10*3/uL (ref 0.0–0.7)
Eosinophils Relative: 2 % (ref 0–5)
Hemoglobin: 7.5 g/dL — ABNORMAL LOW (ref 12.0–15.0)
Lymphocytes Relative: 20 % (ref 12–46)
Lymphs Abs: 1.5 10*3/uL (ref 0.7–4.0)
MCH: 28.1 pg (ref 26.0–34.0)
MCV: 86.5 fL (ref 78.0–100.0)
Monocytes Absolute: 0.6 10*3/uL (ref 0.1–1.0)
Monocytes Relative: 8 % (ref 3–12)
Neutro Abs: 5.1 10*3/uL (ref 1.7–7.7)
RBC: 2.67 MIL/uL — ABNORMAL LOW (ref 3.87–5.11)
WBC: 7.4 10*3/uL (ref 4.0–10.5)

## 2013-06-17 LAB — BASIC METABOLIC PANEL
BUN: 30 mg/dL — ABNORMAL HIGH (ref 6–23)
Calcium: 8.8 mg/dL (ref 8.4–10.5)
Chloride: 99 mEq/L (ref 96–112)
GFR calc non Af Amer: 41 mL/min — ABNORMAL LOW (ref >90)
Glucose, Bld: 355 mg/dL — ABNORMAL HIGH (ref 70–99)

## 2013-06-17 MED ORDER — INSULIN ASPART 100 UNIT/ML ~~LOC~~ SOLN
8.0000 [IU] | Freq: Once | SUBCUTANEOUS | Status: AC
  Administered 2013-06-17: 8 [IU] via SUBCUTANEOUS
  Filled 2013-06-17: qty 1

## 2013-06-17 MED ORDER — DOXYCYCLINE HYCLATE 100 MG PO CAPS: 100.0000 mg | ORAL_CAPSULE | Freq: Two times a day (BID) | ORAL | Status: DC

## 2013-06-17 NOTE — ED Provider Notes (Signed)
CSN: [id number]     Arrival date & time 06/17/13  1522 History   First MD Initiated Contact with Patient 06/17/13 1545     Chief Complaint  Patient presents with  . Suture / Staple Removal    examine suture line  . Wound Check    Pt of Dr Sharol Given. To be seen in ED by Dr Sharol Given   (Consider location/radiation/quality/duration/timing/severity/associated sxs/prior Treatment) HPI [name] is a 47 y.o. female who presents to emergency department complaining of left foot injury. Patient states she had midfoot amputation she told me was 2 weeks ago but according to the records was 4 weeks ago. States she has had problems with it healing then. Was seen by Dr. due to 4 days ago and was put on antibiotic the name of which she does not remember. States 2 days ago she hit her foot on the cement steps outside and states she feels like a split open more than before. Patient admits to discoloration to the end of her foot around the stitches area. She admits to some drainage that is clear. She denies any fever, chills, malaise. States she's also applied Silvadene dressing. Fascia is a nurse that comes once a day and helps her with wound care.   Past Medical History  Diagnosis Date  . Hepatitis C virus   . Neuropathy   . Glaucoma   . Bipolar 1 disorder   . CHF (congestive heart failure)   . Refusal of blood transfusions as patient is Jehovah's Witness   . Hypertension   . Shortness of breath   . Diabetes mellitus     INSULIN DEPENDENT  . TIA (transient ischemic attack)   . Neuromuscular disorder     DIABETIC NEUROPATHY  . Chronic kidney disease    Past Surgical History  Procedure Laterality Date  . Abdominal hysterectomy    . Amputation Left 05/21/2013    Procedure: Left Midfoot AMPUTATION;  Surgeon: Newt Minion, MD;  Location: [place];  Service: Orthopedics;  Laterality: Left;  Left Midfoot Amputation  . Tubal ligation     No family history on file. History  Substance Use Topics  . Smoking  status: Former Smoker -- 30 years    Quit date: 05/04/2013  . Smokeless tobacco: Never Used  . Alcohol Use: No   OB History   Grav Para Term Preterm Abortions TAB SAB Ect Mult Living                 Review of Systems  Constitutional: Negative for fever and chills.  Respiratory: Negative for cough, chest tightness and shortness of breath.   Cardiovascular: Negative for chest pain, palpitations and leg swelling.  Genitourinary: Negative for dysuria, flank pain, vaginal bleeding, vaginal discharge, vaginal pain and pelvic pain.  Musculoskeletal: Positive for arthralgias. Negative for myalgias, neck pain and neck stiffness.  Skin: Positive for color change and wound.  Neurological: Negative for dizziness, weakness and headaches.  All other systems reviewed and are negative.    Allergies  Gabapentin and Penicillins  Home Medications   Current Outpatient Rx  Name  Route  Sig  Dispense  Refill  . carvedilol (COREG) 6.25 MG tablet   Oral   Take 1 tablet (6.25 mg total) by mouth 2 (two) times daily with a meal.   60 tablet   3   . DULoxetine (CYMBALTA) 60 MG capsule   Oral   Take 60 mg by mouth daily.          Marland Kitchen  feeding supplement, GLUCERNA SHAKE, (GLUCERNA SHAKE) LIQD   Oral   Take 237 mLs by mouth 2 (two) times daily between meals.   60 Can   6   . hydrochlorothiazide (MICROZIDE) 12.5 MG capsule   Oral   Take 1 capsule (12.5 mg total) by mouth daily.   30 capsule   0   . insulin aspart (NOVOLOG) 100 UNIT/ML injection   Subcutaneous   Inject 2-20 Units into the skin 3 (three) times daily before meals. Sliding scale         . insulin glargine (LANTUS) 100 UNIT/ML injection   Subcutaneous   Inject 0.4 mLs (40 Units total) into the skin 2 (two) times daily.   10 mL   12   . levETIRAcetam (KEPPRA) 1000 MG tablet   Oral   Take 1,000 mg by mouth at bedtime.          Marland Kitchen MAGNESIUM PO   Oral   Take 1 tablet by mouth daily.         Marland Kitchen oxyCODONE-acetaminophen  (PERCOCET/ROXICET) 5-325 MG per tablet   Oral   Take 1-2 tablets by mouth every 4 (four) hours as needed.   30 tablet   0   . polyethylene glycol (MIRALAX / GLYCOLAX) packet   Oral   Take 17 g by mouth daily.   14 each   0   . senna-docusate (SENOKOT-S) 8.6-50 MG per tablet   Oral   Take 2 tablets by mouth at bedtime.         . vitamin C (ASCORBIC ACID) 500 MG tablet   Oral   Take 500 mg by mouth daily.         Marland Kitchen zolpidem (AMBIEN CR) 6.25 MG CR tablet   Oral   Take 6.25 mg by mouth at bedtime as needed for sleep.          BP 123/77  Pulse 94  Temp(Src) 98.1 F (36.7 C) (Oral)  Resp 18  Wt 168 lb (76.204 kg)  SpO2 100% Physical Exam  Nursing note and vitals reviewed. Constitutional: She appears well-developed and well-nourished. No distress.  Eyes: Conjunctivae are normal.  Neck: Neck supple.  Cardiovascular: Normal rate, regular rhythm and normal heart sounds.   Pulmonary/Chest: Effort normal and breath sounds normal. No respiratory distress. She has no rales.  Musculoskeletal:  There is prior amputation to the midfoot of the left foot. Stitches are still intact with some adhesions of the wound present. There is discoloration to the plantar part of the incision, it appears macerated. There is no erythema or warmth to touch. There is clear drainage with palpation. Mild edema of the left lower leg and ankle. No erythema or tenderness.  Neurological: She is alert.  Skin: Skin is warm and dry.    ED Course  Procedures (including critical care time) Labs Review Labs Reviewed  CBC WITH DIFFERENTIAL - Abnormal; Notable for the following:    RBC 2.67 (*)    Hemoglobin 7.5 (*)    HCT 23.1 (*)    RDW 16.0 (*)    All other components within normal limits  BASIC METABOLIC PANEL - Abnormal; Notable for the following:    Sodium 130 (*)    Glucose, Bld 355 (*)    BUN 30 (*)    Creatinine, Ser 1.49 (*)    GFR calc non Af Amer 41 (*)    GFR calc Af Amer 47 (*)     All other components within normal limits  Imaging Review Dg Foot Complete Left  06/17/2013   CLINICAL DATA:  Recent injury with Re opening of surgical wound  EXAM: LEFT FOOT - COMPLETE 3+ VIEW  COMPARISON:  06/03/2013, 06/02/2013  FINDINGS: Soft tissue swelling is noted at the surgical wound. There is significant destruction of what is left of the metatarsals when compare with the prior exams. These changes are consistent with osteomyelitis. Clinical correlation is recommended. No acute fracture is noted.  IMPRESSION: Increasing destruction of the residual metatarsal bones suggestive of osteomyelitis.   Electronically Signed   By: Inez Catalina M.D.   On: 06/17/2013 16:54    EKG Interpretation   None       MDM   1. Post-operative infection, initial encounter   2. Wound dehiscence, initial encounter     Pt with dehiscence and possible infection of left foot post surgical wound from amputation. Pt appears to be in no distress. Afebrile, non toxic.   6:03 PM Labs and x-ray as above. 8 units of insulin given SU for hyperglycemia. Discussed importance of blood sugar control.   Discussed with Dr. Sharol Given, who wanted to make sure pt did proper wound care at home, silvedene cream, doxycycline, see him in the office on Monday.  Patient's lab work showed hyperglycemia as stated above as well as anemia which is chronic. Patient is not symptomatic for her anemia. Will need close recheck. Instructed to followup with primary care doctor. Plan discussed with pt,who agrees. Home with follow up.   Filed Vitals:   06/17/13 1535  BP: 123/77  Pulse: 94  Temp: 98.1 F (36.7 C)  Resp: 18        Shalev Helminiak A Hodari Chuba, PA-C 06/17/13 1810

## 2013-06-17 NOTE — ED Notes (Signed)
Bed: WTR7 Expected date:  Expected time:  Means of arrival:  Comments: EMS-stitches

## 2013-06-17 NOTE — ED Notes (Signed)
Pt stated that she was called by home care nurse and was instructed to report to ED to be seen by surgeon. Pt may have broken suture line. Pt is 2 weeks post op-amputation of 1/2 l/foot due to infection

## 2013-06-17 NOTE — ED Provider Notes (Signed)
Medical screening examination/treatment/procedure(s) were conducted as a shared visit with non-physician practitioner(s) and myself.  I personally evaluated the patient during the encounter.  EKG Interpretation   None        Patient here for foot pain. 4 weeks ago had partial L foot amputation. Recently placed on antibiotics but unsure which one. Patient extremely poor historian. Not supposed to be weight bearing at this time. Foot without erythema, no warmth, no exudates. States she hit it on the floor within the past 2 days (story keeps changing). Xray negative. Ortho recommended Doxy and f/u in 3-4 days.  Osvaldo Shipper, MD 06/17/13 [phone]

## 2013-06-17 NOTE — ED Notes (Signed)
Per EMS pt had half of left foot amputated. Pt bumped foot and stitches open. Pt referred to come to ED to have stitches removed and replaced.

## 2013-06-17 NOTE — Progress Notes (Signed)
   CARE MANAGEMENT ED NOTE 06/17/2013  Patient:  JAZZABELLA, CHABOLLA   Account Number:  [id number]  Date Initiated:  06/17/2013  Documentation initiated by:  Jackelyn Poling  Subjective/Objective Assessment:   47 yr old femal medicare/medicaid patient c/o need replacement of sutures of amputed site. [facility]/PT/OT since 06/04/13 [facility] d/c Pt also being followed by [facility] CM services     Subjective/Objective Assessment Detail:   Resumption of these home health orders will be needed at d/c pcp Maurice Small- Lutricia Horsfall     Action/Plan:   Action/Plan Detail:   Anticipated DC Date:  06/17/2013     Status Recommendation to Physician:   Result of Recommendation:    Other ED [place]  Other    Choice offered to / List presented to:       [facility] arranged  HH-1 RN  [place].    Status of service:  Completed, signed off  ED Comments:   ED Comments Detail:

## 2013-06-20 ENCOUNTER — Inpatient Hospital Stay (HOSPITAL_COMMUNITY)
Admission: EM | Admit: 2013-06-20 | Discharge: 2013-06-25 | DRG: 812 | Disposition: A | Discharge status: Skilled Nursing Facility | Payer: Medicare Other | Attending: Internal Medicine | Admitting: Internal Medicine

## 2013-06-20 ENCOUNTER — Encounter (HOSPITAL_COMMUNITY): Payer: Self-pay | Admitting: Emergency Medicine

## 2013-06-20 DIAGNOSIS — Z8614 Personal history of Methicillin resistant Staphylococcus aureus infection: Secondary | ICD-10-CM

## 2013-06-20 DIAGNOSIS — R079 Chest pain, unspecified: Secondary | ICD-10-CM

## 2013-06-20 DIAGNOSIS — E860 Dehydration: Secondary | ICD-10-CM

## 2013-06-20 DIAGNOSIS — N185 Chronic kidney disease, stage 5: Secondary | ICD-10-CM | POA: Diagnosis present

## 2013-06-20 DIAGNOSIS — A599 Trichomoniasis, unspecified: Secondary | ICD-10-CM

## 2013-06-20 DIAGNOSIS — R627 Adult failure to thrive: Secondary | ICD-10-CM | POA: Diagnosis present

## 2013-06-20 DIAGNOSIS — Z79899 Other long term (current) drug therapy: Secondary | ICD-10-CM

## 2013-06-20 DIAGNOSIS — E1149 Type 2 diabetes mellitus with other diabetic neurological complication: Secondary | ICD-10-CM | POA: Diagnosis present

## 2013-06-20 DIAGNOSIS — E11621 Type 2 diabetes mellitus with foot ulcer: Secondary | ICD-10-CM

## 2013-06-20 DIAGNOSIS — H409 Unspecified glaucoma: Secondary | ICD-10-CM | POA: Diagnosis present

## 2013-06-20 DIAGNOSIS — T8131XA Disruption of external operation (surgical) wound, not elsewhere classified, initial encounter: Secondary | ICD-10-CM | POA: Diagnosis present

## 2013-06-20 DIAGNOSIS — E1142 Type 2 diabetes mellitus with diabetic polyneuropathy: Secondary | ICD-10-CM | POA: Diagnosis present

## 2013-06-20 DIAGNOSIS — IMO0002 Reserved for concepts with insufficient information to code with codable children: Secondary | ICD-10-CM

## 2013-06-20 DIAGNOSIS — D631 Anemia in chronic kidney disease: Secondary | ICD-10-CM | POA: Diagnosis present

## 2013-06-20 DIAGNOSIS — F319 Bipolar disorder, unspecified: Secondary | ICD-10-CM | POA: Diagnosis present

## 2013-06-20 DIAGNOSIS — E876 Hypokalemia: Secondary | ICD-10-CM

## 2013-06-20 DIAGNOSIS — I129 Hypertensive chronic kidney disease with stage 1 through stage 4 chronic kidney disease, or unspecified chronic kidney disease: Secondary | ICD-10-CM | POA: Diagnosis present

## 2013-06-20 DIAGNOSIS — Z531 Procedure and treatment not carried out because of patient's decision for reasons of belief and group pressure: Secondary | ICD-10-CM

## 2013-06-20 DIAGNOSIS — F1411 Cocaine abuse, in remission: Secondary | ICD-10-CM | POA: Diagnosis present

## 2013-06-20 DIAGNOSIS — K644 Residual hemorrhoidal skin tags: Secondary | ICD-10-CM | POA: Diagnosis present

## 2013-06-20 DIAGNOSIS — N183 Chronic kidney disease, stage 3 unspecified: Secondary | ICD-10-CM

## 2013-06-20 DIAGNOSIS — I10 Essential (primary) hypertension: Secondary | ICD-10-CM

## 2013-06-20 DIAGNOSIS — E1159 Type 2 diabetes mellitus with other circulatory complications: Secondary | ICD-10-CM

## 2013-06-20 DIAGNOSIS — D500 Iron deficiency anemia secondary to blood loss (chronic): Secondary | ICD-10-CM | POA: Diagnosis present

## 2013-06-20 DIAGNOSIS — R609 Edema, unspecified: Secondary | ICD-10-CM

## 2013-06-20 DIAGNOSIS — R42 Dizziness and giddiness: Secondary | ICD-10-CM | POA: Diagnosis present

## 2013-06-20 DIAGNOSIS — B182 Chronic viral hepatitis C: Secondary | ICD-10-CM

## 2013-06-20 DIAGNOSIS — R531 Weakness: Secondary | ICD-10-CM

## 2013-06-20 DIAGNOSIS — N390 Urinary tract infection, site not specified: Secondary | ICD-10-CM

## 2013-06-20 DIAGNOSIS — R519 Headache, unspecified: Secondary | ICD-10-CM

## 2013-06-20 DIAGNOSIS — Z90710 Acquired absence of both cervix and uterus: Secondary | ICD-10-CM

## 2013-06-20 DIAGNOSIS — R5381 Other malaise: Secondary | ICD-10-CM | POA: Diagnosis present

## 2013-06-20 DIAGNOSIS — I951 Orthostatic hypotension: Secondary | ICD-10-CM

## 2013-06-20 DIAGNOSIS — Z880 Allergy status to penicillin: Secondary | ICD-10-CM

## 2013-06-20 DIAGNOSIS — G40909 Epilepsy, unspecified, not intractable, without status epilepticus: Secondary | ICD-10-CM | POA: Diagnosis present

## 2013-06-20 DIAGNOSIS — T8140XA Infection following a procedure, unspecified, initial encounter: Secondary | ICD-10-CM | POA: Diagnosis present

## 2013-06-20 DIAGNOSIS — E871 Hypo-osmolality and hyponatremia: Secondary | ICD-10-CM

## 2013-06-20 DIAGNOSIS — R55 Syncope and collapse: Secondary | ICD-10-CM

## 2013-06-20 DIAGNOSIS — I509 Heart failure, unspecified: Secondary | ICD-10-CM | POA: Diagnosis present

## 2013-06-20 DIAGNOSIS — D649 Anemia, unspecified: Secondary | ICD-10-CM

## 2013-06-20 DIAGNOSIS — D62 Acute posthemorrhagic anemia: Secondary | ICD-10-CM | POA: Diagnosis present

## 2013-06-20 DIAGNOSIS — Z8673 Personal history of transient ischemic attack (TIA), and cerebral infarction without residual deficits: Secondary | ICD-10-CM

## 2013-06-20 DIAGNOSIS — Z833 Family history of diabetes mellitus: Secondary | ICD-10-CM

## 2013-06-20 DIAGNOSIS — Z794 Long term (current) use of insulin: Secondary | ICD-10-CM

## 2013-06-20 DIAGNOSIS — I252 Old myocardial infarction: Secondary | ICD-10-CM

## 2013-06-20 DIAGNOSIS — S98919A Complete traumatic amputation of unspecified foot, level unspecified, initial encounter: Secondary | ICD-10-CM

## 2013-06-20 DIAGNOSIS — L02619 Cutaneous abscess of unspecified foot: Secondary | ICD-10-CM

## 2013-06-20 DIAGNOSIS — E1165 Type 2 diabetes mellitus with hyperglycemia: Secondary | ICD-10-CM

## 2013-06-20 DIAGNOSIS — IMO0001 Reserved for inherently not codable concepts without codable children: Secondary | ICD-10-CM

## 2013-06-20 DIAGNOSIS — B1920 Unspecified viral hepatitis C without hepatic coma: Secondary | ICD-10-CM | POA: Diagnosis present

## 2013-06-20 DIAGNOSIS — K648 Other hemorrhoids: Secondary | ICD-10-CM | POA: Diagnosis present

## 2013-06-20 DIAGNOSIS — Z87891 Personal history of nicotine dependence: Secondary | ICD-10-CM

## 2013-06-20 DIAGNOSIS — N179 Acute kidney failure, unspecified: Secondary | ICD-10-CM

## 2013-06-20 DIAGNOSIS — R7401 Elevation of levels of liver transaminase levels: Secondary | ICD-10-CM

## 2013-06-20 DIAGNOSIS — Z8619 Personal history of other infectious and parasitic diseases: Secondary | ICD-10-CM

## 2013-06-20 LAB — CBC WITH DIFFERENTIAL/PLATELET
Basophils Absolute: 0 10*3/uL (ref 0.0–0.1)
Basophils Relative: 0 % (ref 0–1)
Eosinophils Absolute: 0.1 10*3/uL (ref 0.0–0.7)
Eosinophils Relative: 2 % (ref 0–5)
Lymphs Abs: 1.7 10*3/uL (ref 0.7–4.0)
MCH: 28.9 pg (ref 26.0–34.0)
MCV: 87.1 fL (ref 78.0–100.0)
Monocytes Absolute: 0.4 10*3/uL (ref 0.1–1.0)
Platelets: 326 10*3/uL (ref 150–400)
RDW: 16.1 % — ABNORMAL HIGH (ref 11.5–15.5)

## 2013-06-20 LAB — COMPREHENSIVE METABOLIC PANEL
AST: 22 U/L (ref 0–37)
Albumin: 2 g/dL — ABNORMAL LOW (ref 3.5–5.2)
Alkaline Phosphatase: 109 U/L (ref 39–117)
Creatinine, Ser: 1.47 mg/dL — ABNORMAL HIGH (ref 0.50–1.10)
GFR calc Af Amer: 48 mL/min — ABNORMAL LOW (ref >90)
GFR calc non Af Amer: 41 mL/min — ABNORMAL LOW (ref >90)
Glucose, Bld: 258 mg/dL — ABNORMAL HIGH (ref 70–99)
Sodium: 131 mEq/L — ABNORMAL LOW (ref 135–145)
Total Bilirubin: 0.2 mg/dL — ABNORMAL LOW (ref 0.3–1.2)

## 2013-06-20 LAB — RAPID URINE DRUG SCREEN, HOSP PERFORMED
Barbiturates: NOT DETECTED
Benzodiazepines: NOT DETECTED
Cocaine: NOT DETECTED
Tetrahydrocannabinol: NOT DETECTED

## 2013-06-20 LAB — GLUCOSE, CAPILLARY: Glucose-Capillary: 421 mg/dL — ABNORMAL HIGH (ref 70–99)

## 2013-06-20 LAB — OCCULT BLOOD, POC DEVICE: Fecal Occult Bld: POSITIVE — AB

## 2013-06-20 MED ORDER — LEVETIRACETAM 500 MG PO TABS
1000.0000 mg | ORAL_TABLET | Freq: Every day | ORAL | Status: DC
  Administered 2013-06-20 – 2013-06-24 (×5): 1000 mg via ORAL
  Filled 2013-06-20 (×7): qty 2

## 2013-06-20 MED ORDER — HYDROCHLOROTHIAZIDE 12.5 MG PO CAPS
12.5000 mg | ORAL_CAPSULE | Freq: Every day | ORAL | Status: DC
  Administered 2013-06-21 – 2013-06-25 (×5): 12.5 mg via ORAL
  Filled 2013-06-20 (×5): qty 1

## 2013-06-20 MED ORDER — INSULIN ASPART 100 UNIT/ML ~~LOC~~ SOLN
0.0000 [IU] | Freq: Three times a day (TID) | SUBCUTANEOUS | Status: DC
  Administered 2013-06-21 – 2013-06-23 (×4): 3 [IU] via SUBCUTANEOUS
  Administered 2013-06-23 – 2013-06-24 (×3): 5 [IU] via SUBCUTANEOUS
  Administered 2013-06-24 – 2013-06-25 (×2): 3 [IU] via SUBCUTANEOUS
  Administered 2013-06-25: 11 [IU] via SUBCUTANEOUS

## 2013-06-20 MED ORDER — GLUCERNA SHAKE PO LIQD
237.0000 mL | Freq: Two times a day (BID) | ORAL | Status: DC
  Administered 2013-06-21: 237 mL via ORAL
  Filled 2013-06-20 (×2): qty 237

## 2013-06-20 MED ORDER — HYDRALAZINE HCL 25 MG PO TABS
25.0000 mg | ORAL_TABLET | Freq: Three times a day (TID) | ORAL | Status: DC
  Administered 2013-06-20 – 2013-06-25 (×15): 25 mg via ORAL
  Filled 2013-06-20 (×17): qty 1

## 2013-06-20 MED ORDER — DULOXETINE HCL 60 MG PO CPEP
60.0000 mg | ORAL_CAPSULE | Freq: Every day | ORAL | Status: DC
  Administered 2013-06-21 – 2013-06-25 (×5): 60 mg via ORAL
  Filled 2013-06-20 (×5): qty 1

## 2013-06-20 MED ORDER — HYDRALAZINE HCL 20 MG/ML IJ SOLN: 5.0000 mg | Freq: Four times a day (QID) | INTRAMUSCULAR | Status: DC | PRN

## 2013-06-20 MED ORDER — INSULIN ASPART 100 UNIT/ML ~~LOC~~ SOLN
10.0000 [IU] | Freq: Once | SUBCUTANEOUS | Status: AC
  Administered 2013-06-20: 10 [IU] via SUBCUTANEOUS

## 2013-06-20 MED ORDER — ONDANSETRON HCL 4 MG/2ML IJ SOLN: 4.0000 mg | Freq: Four times a day (QID) | INTRAMUSCULAR | Status: DC | PRN

## 2013-06-20 MED ORDER — POLYETHYLENE GLYCOL 3350 17 G PO PACK
17.0000 g | PACK | Freq: Every day | ORAL | Status: DC
  Administered 2013-06-20 – 2013-06-25 (×5): 17 g via ORAL
  Filled 2013-06-20 (×6): qty 1

## 2013-06-20 MED ORDER — OXYCODONE-ACETAMINOPHEN 5-325 MG PO TABS
1.0000 | ORAL_TABLET | ORAL | Status: DC | PRN
  Administered 2013-06-20 – 2013-06-24 (×8): 2 via ORAL
  Filled 2013-06-20 (×9): qty 2

## 2013-06-20 MED ORDER — ONDANSETRON HCL 4 MG PO TABS: 4.0000 mg | ORAL_TABLET | Freq: Four times a day (QID) | ORAL | Status: DC | PRN

## 2013-06-20 MED ORDER — ZOLPIDEM TARTRATE 5 MG PO TABS
5.0000 mg | ORAL_TABLET | Freq: Every day | ORAL | Status: DC
  Administered 2013-06-20 – 2013-06-24 (×5): 5 mg via ORAL
  Filled 2013-06-20 (×5): qty 1

## 2013-06-20 MED ORDER — CARVEDILOL 6.25 MG PO TABS
6.2500 mg | ORAL_TABLET | Freq: Two times a day (BID) | ORAL | Status: DC
  Administered 2013-06-20 – 2013-06-25 (×10): 6.25 mg via ORAL
  Filled 2013-06-20 (×13): qty 1

## 2013-06-20 MED ORDER — SENNOSIDES-DOCUSATE SODIUM 8.6-50 MG PO TABS
2.0000 | ORAL_TABLET | Freq: Every day | ORAL | Status: DC
  Administered 2013-06-20 – 2013-06-24 (×5): 2 via ORAL
  Filled 2013-06-20 (×7): qty 2

## 2013-06-20 MED ORDER — INSULIN GLARGINE 100 UNIT/ML ~~LOC~~ SOLN
40.0000 [IU] | Freq: Two times a day (BID) | SUBCUTANEOUS | Status: DC
  Administered 2013-06-20 – 2013-06-21 (×3): 40 [IU] via SUBCUTANEOUS
  Filled 2013-06-20 (×4): qty 0.4

## 2013-06-20 NOTE — ED Provider Notes (Signed)
CSN: [id number]     Arrival date & time 06/20/13  1231 History   First MD Initiated Contact with Patient 06/20/13 1245     Chief Complaint  Patient presents with  . Rectal Bleeding   (Consider location/radiation/quality/duration/timing/severity/associated sxs/prior Treatment) HPI Comments: Patient with a history of Hepatitis C presents today with a chief complaint of rectal bleeding.  She reports that she has noticed blood mixed in with her stool for the past 3 days.  She only has rectal bleeding with bowel movements.  She denies abdominal pain, nausea, vomiting, or melena.  Denies fever or chills.  She reports that she has never had anything like this before.  She is currently on 81 mg ASA daily, but no other anticoagulants.  She does have a history of Anemia, but she states that she has has never been told the reason for her Anemia.  She is Jehovah's Witness and refuses blood transfusions.  Patient is a 47 y.o. female presenting with hematochezia. The history is provided by the patient.  Rectal Bleeding   Past Medical History  Diagnosis Date  . Hepatitis C virus   . Neuropathy   . Glaucoma   . Bipolar 1 disorder   . CHF (congestive heart failure)   . Refusal of blood transfusions as patient is Jehovah's Witness   . Hypertension   . Shortness of breath   . Diabetes mellitus     INSULIN DEPENDENT  . TIA (transient ischemic attack)   . Neuromuscular disorder     DIABETIC NEUROPATHY  . Chronic kidney disease    Past Surgical History  Procedure Laterality Date  . Abdominal hysterectomy    . Amputation Left 05/21/2013    Procedure: Left Midfoot AMPUTATION;  Surgeon: Newt Minion, MD;  Location: [place];  Service: Orthopedics;  Laterality: Left;  Left Midfoot Amputation  . Tubal ligation     No family history on file. History  Substance Use Topics  . Smoking status: Former Smoker -- 30 years    Quit date: 05/04/2013  . Smokeless tobacco: Never Used  . Alcohol Use: No   OB  History   Grav Para Term Preterm Abortions TAB SAB Ect Mult Living                 Review of Systems  Gastrointestinal: Positive for hematochezia.  All other systems reviewed and are negative.    Allergies  Gabapentin and Penicillins  Home Medications   Current Outpatient Rx  Name  Route  Sig  Dispense  Refill  . carvedilol (COREG) 6.25 MG tablet   Oral   Take 1 tablet (6.25 mg total) by mouth 2 (two) times daily with a meal.   60 tablet   3   . doxycycline (VIBRAMYCIN) 100 MG capsule   Oral   Take 1 capsule (100 mg total) by mouth 2 (two) times daily.   20 capsule   0   . DULoxetine (CYMBALTA) 60 MG capsule   Oral   Take 60 mg by mouth daily.          . feeding supplement, GLUCERNA SHAKE, (GLUCERNA SHAKE) LIQD   Oral   Take 237 mLs by mouth 2 (two) times daily between meals.   60 Can   6   . hydrochlorothiazide (MICROZIDE) 12.5 MG capsule   Oral   Take 1 capsule (12.5 mg total) by mouth daily.   30 capsule   0   . insulin aspart (NOVOLOG) 100 UNIT/ML injection  Subcutaneous   Inject 2-20 Units into the skin 3 (three) times daily before meals. Sliding scale         . insulin glargine (LANTUS) 100 UNIT/ML injection   Subcutaneous   Inject 0.4 mLs (40 Units total) into the skin 2 (two) times daily.   10 mL   12   . levETIRAcetam (KEPPRA) 1000 MG tablet   Oral   Take 1,000 mg by mouth at bedtime.          Marland Kitchen MAGNESIUM PO   Oral   Take 1 tablet by mouth daily.         Marland Kitchen oxyCODONE-acetaminophen (PERCOCET/ROXICET) 5-325 MG per tablet   Oral   Take 1-2 tablets by mouth every 4 (four) hours as needed.   30 tablet   0   . polyethylene glycol (MIRALAX / GLYCOLAX) packet   Oral   Take 17 g by mouth daily.   14 each   0   . senna-docusate (SENOKOT-S) 8.6-50 MG per tablet   Oral   Take 2 tablets by mouth at bedtime.         . vitamin C (ASCORBIC ACID) 500 MG tablet   Oral   Take 500 mg by mouth daily.         Marland Kitchen zolpidem (AMBIEN CR)  6.25 MG CR tablet   Oral   Take 6.25 mg by mouth at bedtime as needed for sleep.          BP 163/99  Pulse 104  Temp(Src) 97.7 F (36.5 C) (Oral)  Resp 20  SpO2 100% Physical Exam  Nursing note and vitals reviewed. Constitutional: She appears well-developed and well-nourished.  HENT:  Head: Normocephalic and atraumatic.  Mouth/Throat: Oropharynx is clear and moist.  Neck: Normal range of motion. Neck supple.  Cardiovascular: Normal rate, regular rhythm and normal heart sounds.   Pulmonary/Chest: Effort normal and breath sounds normal.  Abdominal: Soft. Bowel sounds are normal. She exhibits no distension and no mass. There is no tenderness. There is no rebound and no guarding.  Genitourinary: Rectal exam shows external hemorrhoid. Guaiac positive stool.  Large non thrombosed external hemorrhoid Dark red stool on rectal exam  Neurological: She is alert.  Skin: Skin is warm and dry.  Psychiatric: She has a normal mood and affect.    ED Course  Procedures (including critical care time) Labs Review Labs Reviewed  CBC WITH DIFFERENTIAL  COMPREHENSIVE METABOLIC PANEL  OCCULT BLOOD, POC DEVICE   Imaging Review No results found.  EKG Interpretation   None      Patient discussed with Dr. Doyle Askew.  She is requesting that GI be consulted prior to admission.  Dr. Wilson Singer discussed with Dr. Michail Sermon from GI.  Dr. Doyle Askew has no agreed to admit the patient.  MDM  No diagnosis found. Patient with a history of chronic anemia and Hep C presents today with a chief complaint of rectal bleeding.  Patient denies melena, abdominal pain, or vomiting.  Hemoccult is positive and stool is dark red in appearance.  Patient's hemoglobin today is 6.5, which is down 1 gram from her 3 days ago.  Patient is refusing blood transfusion due to the fact that she is Jehovah's Witness.  Patient is hemodynamically stable.  Source of lower GI bleed unknown.  Therefore, patient admitted to the hospital by [facility] for further work up and management of lower GI bleed.    Hyman Bible, PA-C 06/22/13 [phone]

## 2013-06-20 NOTE — ED Notes (Addendum)
Critical Hemoglobin of  reported to PA

## 2013-06-20 NOTE — ED Notes (Signed)
Bed: WA20 Expected date:  Expected time:  Means of arrival:  Comments: EMS- blood in stool x3 days

## 2013-06-20 NOTE — H&P (Addendum)
[facility] History and Physical  [name] [id number] DOB: 04-30-1966 DOA: 06/20/2013  Referring physician: ED physician PCP: [name], MD   Chief Complaint: weakness   HPI:  Pt is 47 yo female with very complex and multiple medical conditions who presented to [facility] ED with main concern of progressive generalized weakness that she initially noted several days prior to this admission associated with several episodes of bright red blood per rectum. Pt also explains she feels lightheaded with ambulation but no specific symptoms such as headache, visual changes, no focal neurological symptoms. Pt denies similar events in the past, no chest pain or shortness of breath, no abdominal or urinary concerns.   In ED, CBC revealed Hg 6.5 which is down from recent CBC in November 7.5. Pt is Jehovah's witness and is refusing blood transfusion.   Assessment and Plan:  Principal Problem:   Generalized weakness - this is likely multifactorial and secondary to acute on chronic blood loss anemia, progressive failure to thrive and deconditioning - will admit to medical floor for further observation - pt is Jehovah's witness and therefore no blood products given  - PT evaluation  Active Problems:   CKD (chronic kidney disease) stage 3, GFR 30-59 ml/min - Cr appears to be at baseline   Refusal of blood transfusions as patient is Jehovah's Witness   Normocytic anemia - possibly related to hemorrhoids vs diverticular bleed - FOBT positive, GI consulted and will follow up their recommendations  - CBC in AM   DM (diabetes mellitus), type 2, uncontrolled - check A1C - continue home regimen with Lantus and place on SSI   HTN (hypertension) - accelerated, place on hydralazine scheduled and as needed  - continue home regimen as well    Seizure disorder - continue Keppra    Hyponatremia - chronic and at baseline - BMP in AM  Code Status: Full Family Communication: Pt at  bedside Disposition Plan: Admit to medical floor   Review of Systems:  Constitutional: Negative for diaphoresis.  HENT: Negative for hearing loss, ear pain, nosebleeds, congestion, sore throat, neck pain, tinnitus and ear discharge.   Eyes: Negative for blurred vision, double vision, photophobia, pain, discharge and redness.  Respiratory: Negative for cough, hemoptysis, sputum production, wheezing and stridor.   Cardiovascular: Negative for chest pain, palpitations, orthopnea, claudication and leg swelling.  Gastrointestinal: Negative for nausea, vomiting and abdominal pain. Negative for heartburn, constipation, blood in stool and melena.  Genitourinary: Negative for dysuria, urgency, frequency, hematuria and flank pain.  Musculoskeletal: Negative for myalgias, back pain, joint pain and falls.  Skin: Negative for itching and rash.  Neurological: Negative for tingling, tremors, sensory change, speech change, focal weakness, loss of consciousness and headaches.  Endo/Heme/Allergies: Negative for environmental allergies and polydipsia. Does not bruise/bleed easily.  Psychiatric/Behavioral: Negative for suicidal ideas. The patient is not nervous/anxious.      Past Medical History  Diagnosis Date  . Hepatitis C virus   . Neuropathy   . Glaucoma   . Bipolar 1 disorder   . CHF (congestive heart failure)   . Refusal of blood transfusions as patient is Jehovah's Witness   . Hypertension   . Shortness of breath   . Diabetes mellitus     INSULIN DEPENDENT  . TIA (transient ischemic attack)   . Neuromuscular disorder     DIABETIC NEUROPATHY  . Chronic kidney disease     Past Surgical History  Procedure Laterality Date  . Abdominal hysterectomy    .  Amputation Left 05/21/2013    Procedure: Left Midfoot AMPUTATION;  Surgeon: Newt Minion, MD;  Location: [place];  Service: Orthopedics;  Laterality: Left;  Left Midfoot Amputation  . Tubal ligation      Social History:  reports that she  quit smoking about 6 weeks ago. She has never used smokeless tobacco. She reports that she does not drink alcohol or use illicit drugs.  Allergies  Allergen Reactions  . Gabapentin Itching and Swelling  . Penicillins Hives    No family history on file.  Prior to Admission medications   Medication Sig Start Date End Date Taking? Authorizing Provider  carvedilol (COREG) 6.25 MG tablet Take 1 tablet (6.25 mg total) by mouth 2 (two) times daily with a meal. 05/24/13  Yes Bobby Rumpf York, PA-C  doxycycline (VIBRAMYCIN) 100 MG capsule Take 1 capsule (100 mg total) by mouth 2 (two) times daily. 06/17/13  Yes Tatyana A Kirichenko, PA-C  DULoxetine (CYMBALTA) 60 MG capsule Take 60 mg by mouth daily.    Yes Historical Provider, MD  feeding supplement, GLUCERNA SHAKE, (GLUCERNA SHAKE) LIQD Take 237 mLs by mouth 2 (two) times daily between meals. 05/24/13  Yes Marianne L York, PA-C  hydrochlorothiazide (MICROZIDE) 12.5 MG capsule Take 1 capsule (12.5 mg total) by mouth daily. 06/04/13  Yes Charlynne Cousins, MD  insulin aspart (NOVOLOG) 100 UNIT/ML injection Inject 2-20 Units into the skin 3 (three) times daily before meals. Sliding scale   Yes Historical Provider, MD  insulin glargine (LANTUS) 100 UNIT/ML injection Inject 0.4 mLs (40 Units total) into the skin 2 (two) times daily. 06/04/13  Yes Charlynne Cousins, MD  levETIRAcetam (KEPPRA) 1000 MG tablet Take 1,000 mg by mouth at bedtime.    Yes Historical Provider, MD  MAGNESIUM PO Take 1 tablet by mouth daily.   Yes Historical Provider, MD  oxyCODONE-acetaminophen (PERCOCET/ROXICET) 5-325 MG per tablet Take 1-2 tablets by mouth every 4 (four) hours as needed. 06/04/13  Yes Charlynne Cousins, MD  polyethylene glycol [facility] Floria Raveling) packet Take 17 g by mouth daily. 05/24/13  Yes Marianne L York, PA-C  senna-docusate (SENOKOT-S) 8.6-50 MG per tablet Take 2 tablets by mouth at bedtime. 05/24/13  Yes Marianne L York, PA-C  vitamin C (ASCORBIC  ACID) 500 MG tablet Take 500 mg by mouth daily.   Yes Historical Provider, MD  zolpidem (AMBIEN CR) 6.25 MG CR tablet Take 6.25 mg by mouth at bedtime as needed for sleep.   Yes Historical Provider, MD    Physical Exam: Filed Vitals:   06/20/13 1248  BP: 163/99  Pulse: 104  Temp: 97.7 F (36.5 C)  TempSrc: Oral  Resp: 20  SpO2: 100%    Physical Exam  Constitutional: Appears well-developed and well-nourished. No distress.  HENT: Normocephalic. External right and left ear normal. Oropharynx is clear and moist.  Eyes: Conjunctivae and EOM are normal. PERRLA, no scleral icterus.  Neck: Normal ROM. Neck supple. No JVD. No tracheal deviation. No thyromegaly.  CVS: RRR, S1/S2 +, no murmurs, no gallops, no carotid bruit.  Pulmonary: Effort and breath sounds normal, no stridor, rhonchi, wheezes, rales.  Abdominal: Soft. BS +,  no distension, tenderness, rebound or guarding.  Musculoskeletal: Normal range of motion.  Lymphadenopathy: No lymphadenopathy noted, cervical, inguinal. Neuro: Alert. Normal reflexes, muscle tone coordination. No cranial nerve deficit. Skin: Skin is warm and dry. No rash noted. Not diaphoretic. No erythema. No pallor.  Psychiatric: Normal mood and affect. Behavior, judgment, thought content normal.  Labs on Admission:  Basic Metabolic Panel:  Recent Labs Lab 06/17/13 1620 06/20/13 1320  NA 130* 131*  K 4.5 4.9  CL 99 104  CO2 22 19  GLUCOSE 355* 258*  BUN 30* 39*  CREATININE 1.49* 1.47*  CALCIUM 8.8 8.1*   Liver Function Tests:  Recent Labs Lab 06/20/13 1320  AST 22  ALT 15  ALKPHOS 109  BILITOT 0.2*  PROT 6.9  ALBUMIN 2.0*   CBC:  Recent Labs Lab 06/17/13 1620 06/20/13 1320  WBC 7.4 6.5  NEUTROABS 5.1 4.4  HGB 7.5* 6.5*  HCT 23.1* 19.6*  MCV 86.5 87.1  PLT 298 326   CBG:  Recent Labs Lab 06/17/13 1837  GLUCAP 252*   Radiological Exams on Admission: No results found.  EKG: Normal sinus rhythm, no ST/T wave  changes  [name], MD  [facility] Pager [phone]  If 7PM-7AM, please contact night-coverage [email or web address] Password [facility] 06/20/2013, 2:48 PM

## 2013-06-20 NOTE — Consult Note (Signed)
Referring Provider: Dr. Wilson Singer Primary Care Physician:  [name], MD Primary Gastroenterologist:  Althia Forts  Reason for Consultation:  Rectal bleeding; Anemia  HPI: [name] is a 47 y.o. female admitted for 3 days of rectal bleeding described as red blood with loose stools. Saw a "glob of blood" today with her stools and denies seeing clots on the other days. Felt dizzy for the first time today. Denies abdominal pain, N/V, melena. 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 Hgb 7.5 on 06/17/13 and 6.5 today on admit. Hemodynamically stable on admit. Denies previous episodes of rectal bleeding. Reports having an iron infusion during last admit. 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 Denies NSAIDs. Has never had a colonoscopy. Good appetite. History of Hep C. Hospitalist note from 05/18/13 reports history of crack/cocaine use.  Past Medical History  Diagnosis Date  . Hepatitis C virus   . Neuropathy   . Glaucoma   . Bipolar 1 disorder   . CHF (congestive heart failure)   . Refusal of blood transfusions as patient is Jehovah's Witness   . Hypertension   . Shortness of breath   . Diabetes mellitus     INSULIN DEPENDENT  . TIA (transient ischemic attack)   . Neuromuscular disorder     DIABETIC NEUROPATHY  . Chronic kidney disease   History of cocaine use  Past Surgical History  Procedure Laterality Date  . Abdominal hysterectomy    . Amputation Left 05/21/2013    Procedure: Left Midfoot AMPUTATION;  Surgeon: Newt Minion, MD;  Location: [place];  Service: Orthopedics;  Laterality: Left;  Left Midfoot Amputation  . Tubal ligation      Prior to Admission medications   Medication Sig Start Date End Date Taking? Authorizing Provider  carvedilol (COREG) 6.25 MG tablet Take 1 tablet (6.25 mg total) by mouth 2 (two) times daily with a meal. 05/24/13  Yes Bobby Rumpf York, PA-C  doxycycline (VIBRAMYCIN) 100  MG capsule Take 1 capsule (100 mg total) by mouth 2 (two) times daily. 06/17/13  Yes Tatyana A Kirichenko, PA-C  DULoxetine (CYMBALTA) 60 MG capsule Take 60 mg by mouth daily.    Yes Historical Provider, MD  feeding supplement, GLUCERNA SHAKE, (GLUCERNA SHAKE) LIQD Take 237 mLs by mouth 2 (two) times daily between meals. 05/24/13  Yes Marianne L York, PA-C  hydrochlorothiazide (MICROZIDE) 12.5 MG capsule Take 1 capsule (12.5 mg total) by mouth daily. 06/04/13  Yes Charlynne Cousins, MD  insulin aspart (NOVOLOG) 100 UNIT/ML injection Inject 2-20 Units into the skin 3 (three) times daily before meals. Sliding scale   Yes Historical Provider, MD  insulin glargine (LANTUS) 100 UNIT/ML injection Inject 0.4 mLs (40 Units total) into the skin 2 (two) times daily. 06/04/13  Yes Charlynne Cousins, MD  levETIRAcetam (KEPPRA) 1000 MG tablet Take 1,000 mg by mouth at bedtime.    Yes Historical Provider, MD  MAGNESIUM PO Take 1 tablet by mouth daily.   Yes Historical Provider, MD  oxyCODONE-acetaminophen (PERCOCET/ROXICET) 5-325 MG per tablet Take 1-2 tablets by mouth every 4 (four) hours as needed. 06/04/13  Yes Charlynne Cousins, MD  polyethylene glycol [facility] Floria Raveling) packet Take 17 g by mouth daily. 05/24/13  Yes Marianne L York, PA-C  senna-docusate (SENOKOT-S) 8.6-50 MG per tablet Take 2 tablets by mouth at bedtime. 05/24/13  Yes Marianne L York, PA-C  vitamin C (ASCORBIC ACID)  500 MG tablet Take 500 mg by mouth daily.   Yes Historical Provider, MD  zolpidem (AMBIEN CR) 6.25 MG CR tablet Take 6.25 mg by mouth at bedtime as needed for sleep.   Yes Historical Provider, MD    Scheduled Meds: . carvedilol  6.25 mg Oral BID WC  . [START ON 06/21/2013] DULoxetine  60 mg Oral Daily  . [START ON 06/21/2013] feeding supplement (GLUCERNA SHAKE)  237 mL Oral BID BM  . [START ON 06/21/2013] hydrochlorothiazide  12.5 mg Oral Daily  . insulin glargine  40 Units Subcutaneous BID  . levETIRAcetam  1,000 mg  Oral QHS  . polyethylene glycol  17 g Oral Daily  . senna-docusate  2 tablet Oral QHS  . zolpidem  5 mg Oral QHS   Continuous Infusions:  PRN Meds:.ondansetron (ZOFRAN) IV, ondansetron, oxyCODONE-acetaminophen  Allergies as of 06/20/2013 - Review Complete 06/20/2013  Allergen Reaction Noted  . Gabapentin Itching and Swelling 02/09/2013  . Penicillins Hives 07/12/2011    No family history on file.  History   Social History  . Marital Status: Single    Spouse Name: N/A    Number of Children: N/A  . Years of Education: N/A   Occupational History  . Not on file.   Social History Main Topics  . Smoking status: Former Smoker -- 30 years    Quit date: 05/04/2013  . Smokeless tobacco: Never Used  . Alcohol Use: No  . Drug Use: No  . Sexual Activity: No   Other Topics Concern  . Not on file   Social History Narrative  . No narrative on file    Review of Systems: All negative except as stated above in HPI.  Physical Exam: Vital signs: Filed Vitals:   06/20/13 1800  BP: 183/100  Pulse: 110  Temp: 98 F (36.7 C)  Resp: 18     General:   Alert,  Well-developed, well-nourished, pleasant and cooperative in NAD HEENT: anicteric Lungs:  Clear throughout to auscultation.   No wheezes, crackles, or rhonchi. No acute distress. Heart:  Regular rate and rhythm; no murmurs, clicks, rubs,  or gallops. Abdomen: tender periumbilically with voluntary guarding, otherwise nontender, soft, nondistended, +BS  Rectal:  Deferred Ext: no edema  GI:  Lab Results:  Recent Labs  06/20/13 1320  WBC 6.5  HGB 6.5*  HCT 19.6*  PLT 326   BMET  Recent Labs  06/20/13 1320  NA 131*  K 4.9  CL 104  CO2 19  GLUCOSE 258*  BUN 39*  CREATININE 1.47*  CALCIUM 8.1*   LFT  Recent Labs  06/20/13 1320  PROT 6.9  ALBUMIN 2.0*  AST 22  ALT 15  ALKPHOS 109  BILITOT 0.2*   PT/INR No results found for this basename: LABPROT, INR,  in the last 72  hours   Studies/Results: No results found.  Impression/Plan: 47 yo with history of Hep C and evidence of chronic anemia presenting with 3 days of rectal bleeding. Her rectal bleeding did not cause the severe anemia. She was severely anemic prior to the onset of the bleeding. "Large nonthrombosed external hemorrhoid with dark red stool on rectal exam" reported by ER. Needs EGD/colonoscopy as inpt to look for any source of her anemia. May need Diprivan for the procedures due to her history of drug use but will defer to Dr. Watt Climes. Will start clears tomorrow and defer timing of prep to him.     LOS: 0 days   [place].  06/20/2013, 6:55 PM

## 2013-06-20 NOTE — ED Notes (Signed)
Per EMS: pt c/o blood in stool x 3 days. States she has been eating some of her sisters vegetable soup and thought it may have been from that but stopped eating it about 2 days ago and she still noticed dark red blood. Denies pain. Pt is a Jehovah's Witness.

## 2013-06-21 DIAGNOSIS — D638 Anemia in other chronic diseases classified elsewhere: Secondary | ICD-10-CM

## 2013-06-21 DIAGNOSIS — K625 Hemorrhage of anus and rectum: Secondary | ICD-10-CM

## 2013-06-21 DIAGNOSIS — N189 Chronic kidney disease, unspecified: Secondary | ICD-10-CM

## 2013-06-21 LAB — BASIC METABOLIC PANEL
Calcium: 8 mg/dL — ABNORMAL LOW (ref 8.4–10.5)
Creatinine, Ser: 1.42 mg/dL — ABNORMAL HIGH (ref 0.50–1.10)
GFR calc Af Amer: 50 mL/min — ABNORMAL LOW (ref >90)
GFR calc non Af Amer: 43 mL/min — ABNORMAL LOW (ref >90)
Glucose, Bld: 85 mg/dL (ref 70–99)
Potassium: 4.5 mEq/L (ref 3.5–5.1)
Sodium: 135 mEq/L (ref 135–145)

## 2013-06-21 LAB — GLUCOSE, CAPILLARY
Glucose-Capillary: 157 mg/dL — ABNORMAL HIGH (ref 70–99)
Glucose-Capillary: 81 mg/dL (ref 70–99)

## 2013-06-21 LAB — RETICULOCYTES
RBC.: 2.22 MIL/uL — ABNORMAL LOW (ref 3.87–5.11)
Retic Count, Absolute: 124.3 10*3/uL (ref 19.0–186.0)
Retic Ct Pct: 5.6 % — ABNORMAL HIGH (ref 0.4–3.1)

## 2013-06-21 LAB — CBC
Platelets: 267 10*3/uL (ref 150–400)
RBC: 1.89 MIL/uL — ABNORMAL LOW (ref 3.87–5.11)
RDW: 16.1 % — ABNORMAL HIGH (ref 11.5–15.5)
WBC: 5.9 10*3/uL (ref 4.0–10.5)

## 2013-06-21 LAB — SAVE SMEAR

## 2013-06-21 MED ORDER — DARBEPOETIN ALFA-POLYSORBATE 40 MCG/0.4ML IJ SOLN
40.0000 ug | Freq: Once | INTRAMUSCULAR | Status: AC
  Administered 2013-06-21: 40 ug via SUBCUTANEOUS
  Filled 2013-06-21: qty 0.4

## 2013-06-21 MED ORDER — SODIUM CHLORIDE 0.9 % IV SOLN
INTRAVENOUS | Status: DC
  Administered 2013-06-21 – 2013-06-23 (×2): via INTRAVENOUS

## 2013-06-21 MED ORDER — PEG 3350-KCL-NA BICARB-NACL 420 G PO SOLR
4000.0000 mL | Freq: Once | ORAL | Status: AC
  Administered 2013-06-21: 4000 mL via ORAL

## 2013-06-21 MED ORDER — GLUCERNA SHAKE PO LIQD
237.0000 mL | Freq: Three times a day (TID) | ORAL | Status: DC
  Administered 2013-06-21 – 2013-06-25 (×10): 237 mL via ORAL
  Filled 2013-06-21 (×13): qty 237

## 2013-06-21 MED ORDER — FERROUS SULFATE 325 (65 FE) MG PO TABS
325.0000 mg | ORAL_TABLET | Freq: Three times a day (TID) | ORAL | Status: DC
  Administered 2013-06-21 – 2013-06-25 (×12): 325 mg via ORAL
  Filled 2013-06-21 (×17): qty 1

## 2013-06-21 NOTE — Consult Note (Signed)
[place]  [phone]   HEMATOLOGY ONCOLOGY CONSULTATION   [name]  DOB: [birth year]  MR#: [id number]  CSN#: [id number]    Requesting Physician: [facility] Primary MD: Maurice Small, MD  History of present illness:         47  year old [name], chronic normocytic anemia, chronic renal insufficiency and Hep CV Ab positive  woman asked to see for evaluation of anemia. She was admitted on 06/20/2013 with progressive generalized weakness, worse 3 days prior to admission, accompanied by several episodes of bright red blood per rectum. In the emergency department, her CBC showed a hemoglobin of 6.5and hematocrit of 19.6, dropping from 7.5 and 23.1 on 06/17/2013 and 7.0/ 20.7 on 06/02/2013. Her white count and platelets were normal. Her BUN and creatinine were 39 and 1.47 respectively, in the setting of chronic renal insufficiency her bilirubin was 0.2. Albumin was 2.0. AST 7 ALT is with alkaline phosphatase were normal.Hemoccult was positive. GI was consulted to further evaluate the patient.she was on no NSAIDs, that she was an 81 mg of aspirin. Until admission, she never has had a colonoscopy.she is scheduled for colonoscopy and EGD on 06/22/2013, 2 aches any sources of bleeding.As of 06/21/2013, her hemoglobin reached a level of 5.4 Hospitalist note from 05/18/13 reports history of crack/cocaine use. Important to mention that the patient was on no iron supplement as an outpatient, but she was on doxycycline for the treatment of seems 06/17/2013 following a recent left midfoot amputation in 05/21/2013. We have been asked to see the patient in consultation, to help in the management of her anemia while further procedure as investigation is taking place. She is to receive Aranesp injection to help improve her levels.   Patient had never been evaluated for anemia by a hematologist. Her last IV Iron was received during hospitalization on 06/03/2013  although he initially received IV iron 2 years ago when she was diagnosed with anemic state,she also had Aranesp injection, which per chart notes we have been last  been received at the beginning of November .Never had a bone marrow biopsy. She admits to eat excessive amounts of ice chips, as well as high starch diet, dirt, clay,plaster, chalk and ashes. (?pica).no significant amount of coffee or iced tea. No heavy or irregular periods. No hemoptysis or epistaxis. No blood in urine Retic Count (absolute)was 86.4 on 06/02/2013.  Last Fe levels on 06/02/13  were 60 , TIBC 281  , percent saturation 23 ,Ferritin 140 , B12 702, Folate 15.7. No SPEP or UPEP are available for review.sedimentation rate on 05/19/2013 was 100. Last HIV test on 02/09/2013 was non reactive  Past medical history:      Past Medical History  Diagnosis Date  . Hepatitis C virus   . Neuropathy   . Glaucoma   . Bipolar 1 disorder   . CHF (congestive heart failure)Echo 10.29.2014: ejection fraction range of 60% to 65%. grade 1 diastolic dysfunction.    . Refusal of blood transfusions as patient is Jehovah's Witness   . Hypertension   . Chronic dyspnea   . Diabetes mellitus     INSULIN DEPENDENT  . TIA (transient ischemic attack)   . Neuromuscular disorder     DIABETIC NEUROPATHY  . Chronic kidney disease, stage III   Normocytic anemia, chronic/Iron deficiency anemia Status post MI about 18 months ago while living in [place]. History of MRSA, also diagnosed at the time of MI while living in [place]  Past surgical history:      Past Surgical History  Procedure Laterality Date  . Abdominal hysterectomy    . Amputation Left 05/21/2013    Procedure: Left Midfoot AMPUTATION;  Surgeon: Newt Minion, MD;  Location: [place];  Service: Orthopedics;  Laterality: Left;  Left Midfoot Amputation  . Tubal ligation      Medications:   . carvedilol  6.25 mg Oral BID WC  . darbepoetin  40 mcg Subcutaneous Once  . DULoxetine  60  mg Oral Daily  . feeding supplement (GLUCERNA SHAKE)  237 mL Oral TID WC  . ferrous sulfate  325 mg Oral TID WC  . hydrALAZINE  25 mg Oral Q8H  . hydrochlorothiazide  12.5 mg Oral Daily  . insulin aspart  0-15 Units Subcutaneous TID WC  . insulin glargine  40 Units Subcutaneous BID  . levETIRAcetam  1,000 mg Oral QHS  . polyethylene glycol  17 g Oral Daily  . polyethylene glycol-electrolytes  4,000 mL Oral Once  . senna-docusate  2 tablet Oral QHS  . zolpidem  5 mg Oral QHS     [id number], ondansetron (ZOFRAN) IV, ondansetron, oxyCODONE-acetaminophen  Allergies:  Allergies  Allergen Reactions  . Gabapentin Itching and Swelling  . Penicillins Hives    Family history:   Mother died with diabetes. Father died with diabetes. Mother had a history of anemia as well. She has one sister who had sickle cell trait, and one cousin with sickle cell disease. One sister died with HIV. One sister died with CHF and CKD                            Social history:   Single  Lives in [place] 3 children, 2 boys and 1 girl.. No tobacco. Denies ETOH. No recreational drug use. May contact is her  [name], [id number] and her niece [name], [phone]. Full Code.currently unemployed. Until recently she was working at [employer], in the [place] room. Prior to that, she has worked in brief factories, loading trucks, and Consulting civil engineer. Originally  she is  From [place].     Review of systems:  See HPI for significant positives.  Constitutional: Positive  for weight loss. Negative for fever,night sweats, or chills .significant cold intolerance.Eyes: Negative for blurred vision and double vision.  Respiratory: Negative for cough or  hemoptysis Negative Positive for  shortness of breath.  Cardiovascular: Negative for chest pain. Negative for palpitations.  GI:as per history of present illness No nausea, vomiting, diarrhea, or constipation. No change in bowel caliber. No  Melena at  3 day history of right red blood per rectum in the setting of very large hemorrhoids. No abdominal pain.  [id number] for hematuria. No loss of urinary control. No Urinary retention.  Skin: Negative for itching. No rash. No petechia. No bruising.  Neurological: No headaches. No motor or sensory deficits.No confusion.     Physical exam:       Filed Vitals:   06/21/13 0600  BP: 112/73  Pulse: 105  Temp: 98.2 F (36.8 C)  Resp: 18    Weight change:   General:  10 -year-old AAF in no acute distress A. and O. x3  well-developed  HEENT: Normocephalic, atraumatic, PERRLA, sclerae anicteric. Oral cavity without thrush or lesions. Neck supple. no thyromegaly, no cervical or supraclavicular adenopathy  Lungs clear bilaterally . No wheezing, rhonchi or rales. No axillary masses. Breasts: not examined. Cardiac regular rate  and rhythm, no murmur , rubs or gallops Abdomen soft nontender , bowel sounds x4. No HSM. No masses palpable.  GU/rectal: deferred. Extremities no clubbing, no  Cyanosis, 1-2+ edema on the left lower extremity. Left mid foot amputation.No bruising or petechial rash. There are some areas on her left upper extremity consistent with healed lesions stroke or MRSA infection. Musculoskeletal: no spinal tenderness.  Neuro: Non Focal   Lab results:      Recent Labs Lab 06/17/13 1620 06/20/13 1320 06/21/13 0350  WBC 7.4 6.5 5.9  HGB 7.5* 6.5* 5.4*  HCT 23.1* 19.6* 16.3*  PLT 298 326 267  MCV 86.5 87.1 86.2  MCH 28.1 28.9 28.6  MCHC 32.5 33.2 33.1  RDW 16.0* 16.1* 16.1*  LYMPHSABS 1.5 1.7  --   MONOABS 0.6 0.4  --   EOSABS 0.1 0.1  --   BASOSABS 0.0 0.0  --     Chemistries   Recent Labs Lab 06/17/13 1620 06/20/13 1320 06/21/13 0350  NA 130* 131* 135  K 4.5 4.9 4.5  CL 99 104 107  CO2 22 19 20   GLUCOSE 355* 258* 85  BUN 30* 39* 42*  CREATININE 1.49* 1.47* 1.42*  CALCIUM 8.8 8.1* 8.0*    Anemia panel:  No results found for this basename: VITAMINB12,  FOLATE, FERRITIN, TIBC, IRON, RETICCTPCT,  in the last 72 hours  Coagulation profile No results found for this basename: INR, PROTIME,  in the last 168 hours  Urine Studies No results found for this basename: UACOL, UAPR, USPG, UPH, UTP, UGL, UKET, UBIL, UHGB, UNIT, UROB, ULEU, UEPI, UWBC, URBC, UBAC, CAST, CRYS, UCOM, BILUA,  in the last 72 hours  Studies:      Dg Chest 2 View  06/01/2013   CLINICAL DATA:  Chest pain  EXAM: CHEST  2 VIEW  COMPARISON:  05/17/2013  FINDINGS: Cardiomediastinal silhouette is stable. No pulmonary edema. There is small right pleural effusion with right basilar atelectasis or infiltrate. Bony thorax is unremarkable.  IMPRESSION: No pulmonary edema. Small right pleural effusion with right basilar atelectasis or infiltrate.   Electronically Signed   By: Lahoma Crocker M.D.   On: 06/01/2013 17:10   Mr Foot Left W Wo Contrast  06/03/2013   CLINICAL DATA:  Left foot swelling following amputation for osteomyelitis earlier this month. Patient fell injuring her foot after surgery. Renal insufficiency.  EXAM: MRI OF THE LEFT FOREFOOT WITHOUT AND WITH CONTRAST  TECHNIQUE: Multiplanar, multisequence MR imaging was performed both before and after administration of intravenous contrast.  CONTRAST:  37mL MULTIHANCE GADOBENATE DIMEGLUMINE 529 MG/ML IV SOLN  COMPARISON:  Radiographs 06/02/2013. Preoperative MRI 05/19/2013.  FINDINGS: Study is mildly motion degraded. The remaining foot is imaged.  The patient has undergone interval transmetatarsal amputation as demonstrated on the recent radiographs. There is a complex peripherally enhancing fluid collection within the surgical bed, measuring approximately 6.3 x 2.6 x 2.5 cm. Enhancing fluid tracks proximally along the extensor tendon sheaths. No other focal fluid collections are identified. There is diffuse subcutaneous edema within the ankle and hindfoot.  There is nonspecific low level edema within the amputated metatarsal bases. There is  low-level edema and enhancement within the cuboid and cuneiform bones. No cortical destruction is identified. The talus, navicular and calcaneus appear normal.  IMPRESSION: 1. Postsurgical changes status post recent transmetatarsal amputation. 2. There is a peripheral enhancing fluid collection within the surgical bed which is nonspecific this soon following surgery. This could reflect a postoperative hematoma/ seroma. An abscess  cannot be excluded. 3. Nonspecific low-level marrow edema and enhancement within the partially amputated metatarsals as well as the cuboid and cuneiform bones. Some degree of hyperemia in these bones is expected related to the recent surgery, and there is no cortical destruction to strongly suggest recurrent osteomyelitis.   Electronically Signed   By: Camie Patience M.D.   On: 06/03/2013 16:28   Nm Pulmonary Perf And Vent  06/01/2013   CLINICAL DATA:  Pulmonary embolism. Chest pain. Recent operation.  EXAM: NUCLEAR MEDICINE VENTILATION - PERFUSION LUNG SCAN  TECHNIQUE: Ventilation images were obtained in multiple projections using inhaled aerosol technetium 99 M DTPA. Perfusion images were obtained in multiple projections after intravenous injection of Tc-80m MAA.  COMPARISON:  Chest radiograph 06/01/2013.  RADIOPHARMACEUTICALS:  Forty mCi Tc-28m DTPA aerosol and 6 mCi Tc-69m MAA  FINDINGS: Ventilation: Patchy ventilation is present. Some of the radiotracer was swallowed and is present in the stomach. Particulate radiotracer outlines the trachea.  Perfusion: The perfusion study is normal.  IMPRESSION: Normal perfusion study.  No findings of pulmonary embolism.   Electronically Signed   By: Dereck Ligas M.D.   On: 06/01/2013 21:30   Dg Foot 2 Views Left  06/02/2013   CLINICAL DATA:  Left foot pain and stump drainage.  EXAM: LEFT FOOT - 2 VIEW  COMPARISON:  MRI of the left foot May 19, 2013  FINDINGS: Status post transmetatarsal amputation, minimal osteopenia lung the lateral  aspect of the medial cuneiform seen only on the frontal radiograph. No fracture deformity or dislocation. Type 2 navicular bone. Mild irregularity of the stomach soft tissues Common a radiopaque foreign bodies ; ankle soft tissue swelling.  IMPRESSION: Status post transmetatarsal amputation, with expected postoperative changes; slight lucency along the lateral aspect of the medial cuneiform, which may be projectional though, if clinical concern for recurrent osteomyelitis consider repeat MRI of the foot. No acute fracture deformity or dislocation.   Electronically Signed   By: Elon Alas   On: 06/02/2013 00:50   Dg Foot Complete Left  06/17/2013   CLINICAL DATA:  Recent injury with Re opening of surgical wound  EXAM: LEFT FOOT - COMPLETE 3+ VIEW  COMPARISON:  06/03/2013, 06/02/2013  FINDINGS: Soft tissue swelling is noted at the surgical wound. There is significant destruction of what is left of the metatarsals when compare with the prior exams. These changes are consistent with osteomyelitis. Clinical correlation is recommended. No acute fracture is noted.  IMPRESSION: Increasing destruction of the residual metatarsal bones suggestive of osteomyelitis.   Electronically Signed   By: Inez Catalina M.D.   On: 06/17/2013 16:54    Assessmnent/Plan:47 y.o. female with multiple medical issues including hepatitis C antibody positive, chronic kidney disease, Jehovah's Witness and a diagnosis normocytic anemia in the setting of possible pica, asked to see for evaluation of anemia.current hemoglobin value as are 5.4 and hematocrit 16.6, with an MCV of 86.2 normal white count and platelets. A peripheral blood smear has been ordered for review. The patient has been experiencing bright red blood per rectum, which would required as of 06/22/2013 a colonoscopy and upper endoscopy to look for sources of bleeding.We were  Dr.  Jana Hakim   is to see the patient following this consult with recommendations regarding  diagnosis, treatment options and further workup studies.  An addendum to this note is to be written. Thank you for the referral.   [name] Jumbo, PA-C 06/21/2013    ADDENDUM: 47 y/o [place] woman with severe anemia despite  a ferritin >100 and a retic count of 124. She has a normal MCV currently and the white cell and platelet series are unremarkable. She received iron dextran 06/03/2013 and aranesp 10/30 and 06/21/2013. She has an ESR of 100, creatinine between 1.4 and 1.7, t bil 0.2. She underwent hysterectomy and unilateral salpingo-oophorectomy in [place] in 2011.   The patient's anemia is due to bleeding, anemia of inflammation, and anemia of renal injury. The last two can be corrected with EPO and I will schedule the patient for EPO supplementation through our office after discharge. The GIB is being investigated and she is scheduled for colonoscopy tomorrow under Dr Watt Climes.  Would draw "fingerstick CBCs" until Hb improves. Will hold off on other bloodwork at this time. Will follow with you.  I personally saw this patient and performed a substantive portion of this encounter with the listed APP documented above.   Chauncey Cruel, MD

## 2013-06-21 NOTE — Progress Notes (Signed)
Inpatient Diabetes Program Recommendations  AACE/ADA: New Consensus Statement on Inpatient Glycemic Control (2013)  Target Ranges:  Prepandial:   less than 140 mg/dL      Peak postprandial:   less than 180 mg/dL (1-2 hours)      Critically ill patients:  140 - 180 mg/dL   Reason for Visit: Hyperglycemia  Blood sugars much improved today with pt receiving home dose of Lantus 40 units bid. Eating 100% - may need small amt of meal coverage insulin.  Results for KYRSTLE, KAUFHOLD (MRN [id number]) as of 06/21/2013 14:49  Ref. Range 06/17/2013 18:37 06/20/2013 21:36 06/21/2013 07:12 06/21/2013 12:22  Glucose-Capillary Latest Range: 70-99 mg/dL 252 (H) 421 (H) 89 157 (H)   Recommendations: Add meal coverage insulin - Novolog 3 units tidwc if pt eats >50% meal.  Will continue to follow. Thank you. Lorenda Peck, RD, LDN, CDE Inpatient Diabetes Coordinator [phone]

## 2013-06-21 NOTE — Progress Notes (Signed)
CRITICAL VALUE ALERT  Critical value received:  Hgb: 5.4  Date of notification:  06/21/13  Time of notification:  0438  Critical value read back:yes  Nurse who received alert:  Laurell Roof, RN  MD notified (1st page):  Reidler  Time of first page:  [phone]  MD notified (2nd page): Reidler  Time of second page: [phone]  Responding MD:  Reidler  Time MD responded:  [phone]

## 2013-06-21 NOTE — Progress Notes (Signed)
[name] 10:44 AM  Subjective: Patient in good spirits without specific new complaint although she has seen some bright red blood per rectum for 3 days and her history was reviewed and discussed with my partner  Objective: Vital signs stable afebrile no acute distress abdomen is soft nontender hemoglobin decreased unfortunately  Assessment: Multiple medical problems in a Jehovah's Witness who has a decreasing hemoglobin and bright red blood per rectum Plan: The risks benefits and methods of colonoscopy was discussed with the patient and will proceed tomorrow in the meantime consider hematologic consult or IV iron or Procrit or erythropointin or similar product if patient will allow  [facility]

## 2013-06-21 NOTE — Progress Notes (Addendum)
Patient ID: [name], female   DOB: 15-Oct-1965, 47 y.o.   MRN: [id number]  [facility] PROGRESS NOTE  [name] [id number] DOB: [birth year] DOA: 06/20/2013 PCP: Maurice Small, D, MD  Brief narrative: Pt is 47 yo female with very complex and multiple medical conditions who presented to [facility] ED with main concern of progressive generalized weakness that she initially noted several days prior to this admission associated with several episodes of bright red blood per rectum. Pt also explains she feels lightheaded with ambulation but no specific symptoms such as headache, visual changes, no focal neurological symptoms. Pt denies similar events in the past, no chest pain or shortness of breath, no abdominal or urinary concerns.   In ED, CBC revealed Hg 6.5 which is down from recent CBC in November 7.5. Pt is Jehovah's witness and is refusing blood transfusion.   Assessment and Plan:  Principal Problem:  Generalized weakness  - this is likely multifactorial and secondary to acute on chronic blood loss anemia, progressive failure to thrive and deconditioning  - pt is Jehovah's witness and therefore no blood products given  - PT evaluation once pt more medically stable  Active Problems:  CKD (chronic kidney disease) stage 3, GFR 30-59 ml/min  - Cr appears to be at baseline  Refusal of blood transfusions as patient is Jehovah's Witness  Normocytic anemia  - possibly related to hemorrhoids vs diverticular bleed  - FOBT positive, GI consulted, colonoscopy planner for 11/18 - CBC in AM  - hematology consult  DM (diabetes mellitus), type 2, uncontrolled  - check A1C once H/H more stable - continue home regimen with Lantus and place on SSI  HTN (hypertension)  - accelerated, place on hydralazine scheduled and as needed  - continue home regimen as well  Seizure disorder  - continue Keppra  Hyponatremia  - chronic and at baseline  - BMP in  AM  Consultants:  GI  Procedures/Studies:  None  Antibiotics:  None   Code Status: Full Family Communication: Pt at bedside Disposition Plan: Home when medically stable  HPI/Subjective: No events overnight.   Objective: Filed Vitals:   06/20/13 1709 06/20/13 1800 06/20/13 2115 06/21/13 0600  BP: 193/107 183/100 159/89 112/73  Pulse: 105 110 100 105  Temp: 97.8 F (36.6 C) 98 F (36.7 C) 97.5 F (36.4 C) 98.2 F (36.8 C)  TempSrc: Oral Oral Oral Oral  Resp: 18 18 18 18   Height:      Weight:      SpO2: 100% 100% 100% 100%    Intake/Output Summary (Last 24 hours) at 06/21/13 1020 Last data filed at 06/21/13 0615  Gross per 24 hour  Intake    240 ml  Output   2151 ml  Net  -1911 ml    Exam:   General:  Pt is alert, follows commands appropriately, not in acute distress  Cardiovascular: Regular rate and rhythm, S1/S2, no murmurs, no rubs, no gallops  Respiratory: Clear to auscultation bilaterally, no wheezing, no crackles, no rhonchi  Abdomen: Soft, non tender, non distended, bowel sounds present, no guarding  Extremities: pulses DP and PT palpable bilaterally  Neuro: Grossly nonfocal  Data Reviewed: Basic Metabolic Panel:  Recent Labs Lab 06/17/13 1620 06/20/13 1320 06/21/13 0350  NA 130* 131* 135  K 4.5 4.9 4.5  CL 99 104 107  CO2 22 19 20   GLUCOSE 355* 258* 85  BUN 30* 39* 42*  CREATININE 1.49* 1.47* 1.42*  CALCIUM 8.8 8.1* 8.0*  Liver Function Tests:  Recent Labs Lab 06/20/13 1320  AST 22  ALT 15  ALKPHOS 109  BILITOT 0.2*  PROT 6.9  ALBUMIN 2.0*   CBC:  Recent Labs Lab 06/17/13 1620 06/20/13 1320 06/21/13 0350  WBC 7.4 6.5 5.9  NEUTROABS 5.1 4.4  --   HGB 7.5* 6.5* 5.4*  HCT 23.1* 19.6* 16.3*  MCV 86.5 87.1 86.2  PLT 298 326 267   CBG:  Recent Labs Lab 06/17/13 1837 06/20/13 2136 06/21/13 0712  GLUCAP 252* 421* 89   Scheduled Meds: . carvedilol  6.25 mg Oral BID WC  . DULoxetine  60 mg Oral Daily  .  feeding supplement (GLUCERNA SHAKE)  237 mL Oral BID BM  . ferrous sulfate  325 mg Oral TID WC  . hydrALAZINE  25 mg Oral Q8H  . hydrochlorothiazide  12.5 mg Oral Daily  . insulin aspart  0-15 Units Subcutaneous TID WC  . insulin glargine  40 Units Subcutaneous BID  . levETIRAcetam  1,000 mg Oral QHS  . polyethylene glycol  17 g Oral Daily  . polyethylene glycol-electrolytes  4,000 mL Oral Once  . senna-docusate  2 tablet Oral QHS  . zolpidem  5 mg Oral QHS   Continuous Infusions: . sodium chloride     [name], MD  [facility] Pager [phone]  If 7PM-7AM, please contact night-coverage [email or web address] Password [email or web address] 06/21/2013, 10:20 AM   LOS: 1 day

## 2013-06-21 NOTE — Care Management Note (Signed)
    Page 1 of 1   06/21/2013     10:35:49 AM   CARE MANAGEMENT NOTE 06/21/2013  Patient:  [name], [name]   Account Number:  [id number]  Date Initiated:  06/21/2013  Documentation initiated by:  Sunday Spillers  Subjective/Objective Assessment:   47 yo female admitted with severe anemia. PTA lived at home with sister.     Action/Plan:   Home when stable   Anticipated DC Date:  06/24/2013   Anticipated DC Plan:  [place]  CM consult      Choice offered to / List presented to:             Status of service:  Completed, signed off Medicare Important Message given?   (If response is "NO", the following Medicare IM given date fields will be blank) Date Medicare IM given:   Date Additional Medicare IM given:    Discharge Disposition:  HOME/SELF CARE  Per UR Regulation:  Reviewed for med. necessity/level of care/duration of stay  If discussed at [place] of Stay Meetings, dates discussed:    Comments:

## 2013-06-22 ENCOUNTER — Telehealth: Payer: Self-pay | Admitting: Oncology

## 2013-06-22 ENCOUNTER — Encounter (HOSPITAL_COMMUNITY): Payer: Self-pay | Admitting: *Deleted

## 2013-06-22 ENCOUNTER — Other Ambulatory Visit: Payer: Self-pay | Admitting: Oncology

## 2013-06-22 ENCOUNTER — Encounter (HOSPITAL_COMMUNITY)
Admission: EM | Disposition: A | Discharge status: Skilled Nursing Facility | Payer: Self-pay | Source: Home / Self Care | Attending: Internal Medicine

## 2013-06-22 ENCOUNTER — Ambulatory Visit: Payer: Medicare Other

## 2013-06-22 DIAGNOSIS — L02619 Cutaneous abscess of unspecified foot: Secondary | ICD-10-CM

## 2013-06-22 DIAGNOSIS — D62 Acute posthemorrhagic anemia: Secondary | ICD-10-CM | POA: Diagnosis not present

## 2013-06-22 DIAGNOSIS — D631 Anemia in chronic kidney disease: Secondary | ICD-10-CM

## 2013-06-22 DIAGNOSIS — N179 Acute kidney failure, unspecified: Secondary | ICD-10-CM

## 2013-06-22 DIAGNOSIS — R5381 Other malaise: Secondary | ICD-10-CM | POA: Diagnosis not present

## 2013-06-22 HISTORY — PX: COLONOSCOPY WITH PROPOFOL: SHX5780

## 2013-06-22 LAB — GLUCOSE, CAPILLARY
Glucose-Capillary: 109 mg/dL — ABNORMAL HIGH (ref 70–99)
Glucose-Capillary: 52 mg/dL — ABNORMAL LOW (ref 70–99)
Glucose-Capillary: 60 mg/dL — ABNORMAL LOW (ref 70–99)
Glucose-Capillary: 95 mg/dL (ref 70–99)
Glucose-Capillary: 57 mg/dL — ABNORMAL LOW (ref 70–99)
Glucose-Capillary: 43 mg/dL — CL (ref 70–99)

## 2013-06-22 LAB — CBC
HCT: 15.9 % — ABNORMAL LOW (ref 36.0–46.0)
Hemoglobin: 5.2 g/dL — CL (ref 12.0–15.0)
MCH: 28.3 pg (ref 26.0–34.0)
MCHC: 32.7 g/dL (ref 30.0–36.0)
RDW: 16 % — ABNORMAL HIGH (ref 11.5–15.5)

## 2013-06-22 LAB — HM COLONOSCOPY

## 2013-06-22 SURGERY — COLONOSCOPY WITH PROPOFOL
Anesthesia: Moderate Sedation

## 2013-06-22 MED ORDER — DEXTROSE 50 % IV SOLN
25.0000 mL | Freq: Once | INTRAVENOUS | Status: AC | PRN
  Administered 2013-06-22: 25 mL via INTRAVENOUS
  Filled 2013-06-22: qty 50

## 2013-06-22 MED ORDER — FENTANYL CITRATE 0.05 MG/ML IJ SOLN
INTRAMUSCULAR | Status: AC
  Filled 2013-06-22: qty 2

## 2013-06-22 MED ORDER — DEXTROSE 50 % IV SOLN
50.0000 mL | Freq: Once | INTRAVENOUS | Status: AC | PRN
  Administered 2013-06-22: 50 mL via INTRAVENOUS

## 2013-06-22 MED ORDER — DIPHENHYDRAMINE HCL 50 MG/ML IJ SOLN
INTRAMUSCULAR | Status: AC
  Filled 2013-06-22: qty 1

## 2013-06-22 MED ORDER — INSULIN ASPART 100 UNIT/ML ~~LOC~~ SOLN
4.0000 [IU] | Freq: Once | SUBCUTANEOUS | Status: AC
  Administered 2013-06-22: 4 [IU] via SUBCUTANEOUS

## 2013-06-22 MED ORDER — FENTANYL CITRATE 0.05 MG/ML IJ SOLN
INTRAMUSCULAR | Status: DC | PRN
  Administered 2013-06-22 (×2): 25 ug via INTRAVENOUS

## 2013-06-22 MED ORDER — SODIUM CHLORIDE 0.9 % IV SOLN
INTRAVENOUS | Status: DC
  Administered 2013-06-22: 05:00:00 via INTRAVENOUS

## 2013-06-22 MED ORDER — DEXTROSE 50 % IV SOLN
25.0000 mL | Freq: Once | INTRAVENOUS | Status: AC | PRN
  Filled 2013-06-22: qty 50

## 2013-06-22 MED ORDER — DEXTROSE 10 % IV SOLN
INTRAVENOUS | Status: DC
  Administered 2013-06-22: 14:00:00 via INTRAVENOUS
  Filled 2013-06-22: qty 1000

## 2013-06-22 MED ORDER — MIDAZOLAM HCL 10 MG/2ML IJ SOLN
INTRAMUSCULAR | Status: AC
  Filled 2013-06-22: qty 2

## 2013-06-22 MED ORDER — DEXTROSE 50 % IV SOLN
INTRAVENOUS | Status: AC
  Administered 2013-06-22: 50 mL
  Filled 2013-06-22: qty 50

## 2013-06-22 MED ORDER — MIDAZOLAM HCL 10 MG/2ML IJ SOLN
INTRAMUSCULAR | Status: DC | PRN
  Administered 2013-06-22: 2 mg via INTRAVENOUS
  Administered 2013-06-22: 1 mg via INTRAVENOUS
  Administered 2013-06-22: 2 mg via INTRAVENOUS

## 2013-06-22 SURGICAL SUPPLY — 22 items

## 2013-06-22 NOTE — Progress Notes (Signed)
CBG was checked prior to going down for colonoscopy.  CBG 43.  D50 10ml given to patient IV.  CBG checked 15 minutes later and was 155.  Patient was no longer symptomatic and felt "much better."  Endo called and patient will still have colonoscopy as scheduled.  Will continue to monitor.

## 2013-06-22 NOTE — Consult Note (Signed)
WOC wound consult note Reason for Consult:Non-healing surgical wound  Wound type:surgical wound.  Patient of Dr. Sharol Given and is s/p ray amputation on left  Pressure Ulcer POA: No Measurement:Incision measures 12cm and is approximated with sutures.  One area in center measures 1cm x 2.5cm x 2cm depth with yellow slough. Wound bed:As above Drainage (amount, consistency, odor) Serous to light yellow exudate Periwound:intact.  Sutures intact.  Patient followed up with Dr. Sharol Given 4 days ago and he is leaving sutures in place at this time. Dressing procedure/placement/frequency: Saline dressing ordered twice daily with Kerlix and ACE for mild edema of LLE. Patient is continuing as a patient of Dr, Sharol Given as an outpatient; had another appointment schedule with him for tomorrow.  Admitting MD may wish to contact Dr. Sharol Given to let him know she is in house. [place] nursing team will not follow, but will remain available to this patient, the nursing and medical/surgical team.  Please re-consult if needed. Thanks, Maudie Flakes, MSN, RN, [place], [place], [place] [phone])

## 2013-06-22 NOTE — Progress Notes (Signed)
Please note that insulin Lantus was discontinued due to hypoglycemia. Will monitor CBG per protocol and restart once oral intake improves.  [name], MD  [facility] Pager [phone]  If 7PM-7AM, please contact night-coverage [email or web address] Password [email or web address]

## 2013-06-22 NOTE — Progress Notes (Signed)
  Colonoscopy delayed to later today. Hb continues to fall. She denies chest pain or pressure or pre-syncopal symptoms. Admits to palpitations when up to the BR. Very hungry-- wants a hamburger with fries after the procedure.   I have made a follow-up appt for [name] at the [facility] for [place] [facility] at 1:15 PM; she should arrive 30 minutes earlier for labs. We will continue EPO support then.  Will follow with you.

## 2013-06-22 NOTE — Progress Notes (Addendum)
Patient ID: [name], female   DOB: 03/22/1966, 47 y.o.   MRN: [id number] [facility] PROGRESS NOTE  [name] [id number] DOB: [birth year] DOA: 06/20/2013 PCP: Maurice Small, D, MD  Brief narrative:  Pt is 47 yo female with very complex and multiple medical conditions who presented to [facility] ED with main concern of progressive generalized weakness that she initially noted several days prior to this admission associated with several episodes of bright red blood per rectum. Pt also explains she feels lightheaded with ambulation but no specific symptoms such as headache, visual changes, no focal neurological symptoms. Pt denies similar events in the past, no chest pain or shortness of breath, no abdominal or urinary concerns.   In ED, CBC revealed Hg 6.5 which is down from recent CBC in November 7.5. Pt is Jehovah's witness and is refusing blood transfusion.   Assessment and Plan:  Principal Problem:  Generalized weakness  - this is likely multifactorial and secondary to acute on chronic blood loss anemia, progressive failure to thrive and deconditioning  - pt is Jehovah's witness and therefore no blood products given to date - PT evaluation once pt more medically stable  Active Problems:  CKD (chronic kidney disease) stage 3, GFR 30-59 ml/min  - Cr appears to be at baseline  Refusal of blood transfusions as patient is Jehovah's Witness  Normocytic anemia  - possibly related to hemorrhoids vs diverticular bleed  - FOBT positive, GI consulted, colonoscopy planner for 11/18  - CBC in AM  - hematology consult appreciated as well  - pt given one dose of Procrit 06/21/2013  Status post recent transmetatarsal amputation - drainage noted from the foot, per pt it is looking better - pt has completed 3 weeks of ABX - wound care consult pending  DM (diabetes mellitus), type 2, uncontrolled  - check A1C once H/H more stable  - will hold Lantus for now as pt is NPO and has hypoglycemia -  will continue SSI  HTN (hypertension)  - accelerated, placed on hydralazine scheduled and as needed  - reasonable control on current regimen  Seizure disorder  - continue Keppra  Hyponatremia  - chronic, corrected and within normal limits this AM - BMP in AM  Consultants:  GI Hematology  Procedures/Studies:  None Antibiotics:  None   Code Status: Full  Family Communication: Pt at bedside  Disposition Plan: Home when medically stable  HPI/Subjective: No events overnight.   Objective: Filed Vitals:   06/21/13 1458 06/21/13 2135 06/22/13 0218 06/22/13 0603  BP: 122/71 136/86  113/74  Pulse:  94  100  Temp:  97.8 F (36.6 C)  98.6 F (37 C)  TempSrc:  Oral  Oral  Resp:  18 18 18   Height:      Weight:    75.161 kg (165 lb 11.2 oz)  SpO2:  100% 100% 100%    Intake/Output Summary (Last 24 hours) at 06/22/13 0830 Last data filed at 06/22/13 0604  Gross per 24 hour  Intake 210.33 ml  Output   2354 ml  Net -2143.67 ml    Exam:   General:  Pt is alert, follows commands appropriately, not in acute distress  Cardiovascular: Regular rhythm, tachycardic, S1/S2, no murmurs, no rubs, no gallops  Respiratory: Clear to auscultation bilaterally, no wheezing, no crackles, no rhonchi  Abdomen: Soft, non tender, non distended, bowel sounds present, no guarding  Extremities: status post recent transmetatarsal amputation left foot, purulent drainage noted, no blood, no evident bone  Neuro: Grossly nonfocal  Data Reviewed: Basic Metabolic Panel:  Recent Labs Lab 06/17/13 1620 06/20/13 1320 06/21/13 0350  NA 130* 131* 135  K 4.5 4.9 4.5  CL 99 104 107  CO2 22 19 20   GLUCOSE 355* 258* 85  BUN 30* 39* 42*  CREATININE 1.49* 1.47* 1.42*  CALCIUM 8.8 8.1* 8.0*   Liver Function Tests:  Recent Labs Lab 06/20/13 1320  AST 22  ALT 15  ALKPHOS 109  BILITOT 0.2*  PROT 6.9  ALBUMIN 2.0*   CBC:  Recent Labs Lab 06/17/13 1620 06/20/13 1320 06/21/13 0350  06/22/13 0500  WBC 7.4 6.5 5.9 5.0  NEUTROABS 5.1 4.4  --   --   HGB 7.5* 6.5* 5.4* 5.2*  HCT 23.1* 19.6* 16.3* 15.9*  MCV 86.5 87.1 86.2 86.4  PLT 298 326 267 241   CBG:  Recent Labs Lab 06/21/13 1222 06/21/13 1624 06/21/13 2123 06/21/13 2349 06/22/13 0737  GLUCAP 157* 187* 81 117* 52*     Scheduled Meds: . carvedilol  6.25 mg Oral BID WC  . DULoxetine  60 mg Oral Daily  . feeding supplement (GLUCERNA SHAKE)  237 mL Oral TID WC  . ferrous sulfate  325 mg Oral TID WC  . hydrALAZINE  25 mg Oral Q8H  . hydrochlorothiazide  12.5 mg Oral Daily  . insulin aspart  0-15 Units Subcutaneous TID WC  . insulin glargine  40 Units Subcutaneous BID  . levETIRAcetam  1,000 mg Oral QHS  . polyethylene glycol  17 g Oral Daily  . senna-docusate  2 tablet Oral QHS  . zolpidem  5 mg Oral QHS   Continuous Infusions: . sodium chloride 20 mL/hr at 06/22/13 0446  . sodium chloride 10 mL/hr at 06/21/13 1800   [name], MD  [facility] Pager [phone]  If 7PM-7AM, please contact night-coverage [email or web address] Password [email or web address] 06/22/2013, 8:30 AM   LOS: 2 days

## 2013-06-22 NOTE — Progress Notes (Signed)
Patient's was rechecked because patient was feeling shaky and sweaty.  CBG 57. Patient is currently refusing D50 25 ml IV at this time saying " I don't want that anymore" (Patient has received D50 on two separate occassions today so far).  Patient stated "Im going to call my daughter to bring me food, I'm not going to do this procedure today.  We can just do it tomorrow." Dr. Watt Climes was paged regarding update and hypoglycemic events.  MD order D10 at [facility] with small bolus of 200 cc.  I explained to patient the importance of doing the procedure today and that I spoke with MD.  She is agreeable at this time. During this time, patient was also complaining of chest pain. EKG performed. Spoke with primary MD during this time and made aware of situation.  Normal EKG. Will continue to monitor.

## 2013-06-22 NOTE — Progress Notes (Signed)
[facility]  Patient Status: Active (receiving services up to time of hospitalization)  [facility] is providing the following services: RN, PT and OT  If patient discharges after hours, please call [phone].   [name] 06/22/2013, 10:53 AM

## 2013-06-22 NOTE — Progress Notes (Signed)
Hypoglycemic Event  CBG: 52  Treatment: D50 IV 25 mL  Symptoms: Sweaty, Shaky and Hungry  Follow-up CBG: [id number] CBG Result:109  Possible Reasons for Event: Inadequate meal intake Pt. NPO for colonoscopy   Comments/MD notified:Dr. Sandie Ano, Dudley Major  Remember to initiate Hypoglycemia Order Set & complete

## 2013-06-22 NOTE — Progress Notes (Signed)
Hypoglycemic Event  CBG: 60  Treatment: D50 IV 25 mL  Symptoms: Sweaty, Shaky, Hungry and Nervous/irritable  Follow-up CBG: Time:1224 CBG Result:95  Possible Reasons for Event: Inadequate meal intake, NPO for colonoscopy  Comments/MD notified:Dr. Sandie Ano, Dudley Major  Remember to initiate Hypoglycemia Order Set & complete

## 2013-06-22 NOTE — Op Note (Signed)
[facility] [place], [address]   COLONOSCOPY PROCEDURE REPORT  PATIENT: [name], [name]  MR#: [id number] BIRTHDATE: [birth year] , 47  yrs. old GENDER: Female ENDOSCOPIST: Clarene Essex, MD REFERRED BY: PROCEDURE DATE:  06/22/2013 PROCEDURE:   Colonoscopy, diagnostic ASA CLASS:   Class III INDICATIONS:Anemia, non-specific and hematochezia. MEDICATIONS: Fentanyl 50 mcg IV and Versed 5 mg IV  DESCRIPTION OF PROCEDURE:   After the risks benefits and alternatives of the procedure were thoroughly explained, informed consent was obtained.  The Pentax Ped Colon [id number]  endoscope was introduced through the anus and advanced to the cecum, which was identified by both the appendix and ileocecal valve , limited by No adverse events experienced.   The quality of the prep was poor. Marland Kitchendespite the poor prep we were able to advance the scope to the cecum without any abdominal pressure or position changes and no signs of bleeding was seen on insertion or slow withdrawal in fact no polyps tumors masses AVMs or diverticuli were seen and there was brown liquidstool  throughout the colon which could only minimally be washed and suctioned without clogging the scopeThe instrument was then slowly withdrawn as the colon was fully examined.on anal rectal pull-through and retroflexion small hemorrhoids with a few small tears were seen and the scope was straightened and we advanced a short ways up  the left side of the colon air was suctioned scope removed the patient tolerated the procedure well there was no obvious immediate complication           FINDINGS:  1 no signs of GI bleeding in the colon 2 small internal and external hemorrhoids with some small tear most likely source of bright red blood per rectum3. Otherwise within normal limits to the cecum however with poor prep lesions could have been missed however no blood was seen  COMPLICATIONS:none  IMPRESSION:   above  RECOMMENDATIONS: advance diet observe for delayed complications if none please let us know if we can be of any further assistance otherwise erythropoietin per hematology team   _______________________________ eSigned:  Clarene Essex, MD 06/22/2013 4:40 PM   CC:  PATIENT NAME:  [name], [name] MR#: [id number]

## 2013-06-22 NOTE — Telephone Encounter (Signed)
worked 11/18 POF tried to call pt Mom states she is inpatient currently CAL mailed shh

## 2013-06-23 ENCOUNTER — Encounter (HOSPITAL_COMMUNITY): Payer: Self-pay | Admitting: Gastroenterology

## 2013-06-23 DIAGNOSIS — I10 Essential (primary) hypertension: Secondary | ICD-10-CM

## 2013-06-23 LAB — BASIC METABOLIC PANEL
BUN: 25 mg/dL — ABNORMAL HIGH (ref 6–23)
CO2: 19 mEq/L (ref 19–32)
Calcium: 8 mg/dL — ABNORMAL LOW (ref 8.4–10.5)
Chloride: 102 mEq/L (ref 96–112)
Creatinine, Ser: 1.42 mg/dL — ABNORMAL HIGH (ref 0.50–1.10)
GFR calc Af Amer: 50 mL/min — ABNORMAL LOW (ref >90)
GFR calc non Af Amer: 43 mL/min — ABNORMAL LOW (ref >90)
Glucose, Bld: 170 mg/dL — ABNORMAL HIGH (ref 70–99)
Potassium: 4 mEq/L (ref 3.5–5.1)
Sodium: 130 mEq/L — ABNORMAL LOW (ref 135–145)

## 2013-06-23 LAB — GLUCOSE, CAPILLARY
Glucose-Capillary: 158 mg/dL — ABNORMAL HIGH (ref 70–99)
Glucose-Capillary: 171 mg/dL — ABNORMAL HIGH (ref 70–99)
Glucose-Capillary: 249 mg/dL — ABNORMAL HIGH (ref 70–99)
Glucose-Capillary: 376 mg/dL — ABNORMAL HIGH (ref 70–99)

## 2013-06-23 LAB — CBC
HCT: 16.3 % — ABNORMAL LOW (ref 36.0–46.0)
Hemoglobin: 5.3 g/dL — CL (ref 12.0–15.0)
MCH: 28.5 pg (ref 26.0–34.0)
MCHC: 32.5 g/dL (ref 30.0–36.0)
MCV: 87.6 fL (ref 78.0–100.0)
Platelets: 259 10*3/uL (ref 150–400)
RDW: 16.2 % — ABNORMAL HIGH (ref 11.5–15.5)

## 2013-06-23 MED ORDER — INSULIN ASPART 100 UNIT/ML ~~LOC~~ SOLN
5.0000 [IU] | Freq: Once | SUBCUTANEOUS | Status: AC
  Administered 2013-06-23: 5 [IU] via SUBCUTANEOUS

## 2013-06-23 MED ORDER — SODIUM CHLORIDE 0.9 % IV BOLUS (SEPSIS)
500.0000 mL | Freq: Once | INTRAVENOUS | Status: AC
  Administered 2013-06-23: 500 mL via INTRAVENOUS

## 2013-06-23 MED ORDER — HYDROMORPHONE HCL PF 1 MG/ML IJ SOLN
1.0000 mg | INTRAMUSCULAR | Status: DC | PRN
  Administered 2013-06-24 (×2): 1 mg via INTRAVENOUS
  Filled 2013-06-23 (×2): qty 1

## 2013-06-23 MED ORDER — ALUM & MAG HYDROXIDE-SIMETH 200-200-20 MG/5ML PO SUSP
30.0000 mL | Freq: Three times a day (TID) | ORAL | Status: DC | PRN
  Administered 2013-06-23: 30 mL via ORAL
  Filled 2013-06-23: qty 30

## 2013-06-23 MED ORDER — SODIUM CHLORIDE 0.9 % IV SOLN
500.0000 mg | Freq: Once | INTRAVENOUS | Status: AC
  Administered 2013-06-23: 500 mg via INTRAVENOUS
  Filled 2013-06-23 (×2): qty 10

## 2013-06-23 MED ORDER — KETOROLAC TROMETHAMINE 30 MG/ML IJ SOLN
30.0000 mg | Freq: Four times a day (QID) | INTRAMUSCULAR | Status: AC | PRN
  Administered 2013-06-23: 30 mg via INTRAVENOUS
  Filled 2013-06-23: qty 1

## 2013-06-23 NOTE — Progress Notes (Signed)
Patient ID: [name], female   DOB: 18-Oct-1965, 47 y.o.   MRN: [id number] [facility] PROGRESS NOTE  [name] [id number] DOB: 1966/01/28 DOA: 06/20/2013 PCP: Maurice Small, D, MD  Brief narrative:  Pt is 47 yo female with very complex and multiple medical conditions who presented to [facility] ED with main concern of progressive generalized weakness that she initially noted several days prior to this admission associated with several episodes of bright red blood per rectum. Pt also explains she feels lightheaded with ambulation but no specific symptoms such as headache, visual changes, no focal neurological symptoms. Pt denies similar events in the past, no chest pain or shortness of breath, no abdominal or urinary concerns.   In ED, CBC revealed Hg 6.5 which is down from recent CBC in November 7.5. Pt is Jehovah's witness and is refusing blood transfusion.   Assessment and Plan:   Generalized weakness  - this is likely multifactorial and secondary to acute on chronic blood loss anemia, progressive failure to thrive and deconditioning  - pt is Jehovah's witness and therefore no blood products given to date - PT evaluation requested.  CKD (chronic kidney disease) stage 3, GFR 30-59 ml/min  - Cr appears to be at baseline   Refusal of blood transfusions as patient is Jehovah's Witness   Normocytic anemia  -Due to AOCD, acute blood loss and CKD. - possibly related to hemorrhoids as per colonoscopy on 11/18. - FOBT positive, GI consulted, colonoscopy planner for 11/18  - CBC in AM  - hematology consult appreciated as well. - pt given one dose of Procrit 06/21/2013, ans will follow up with heme/onc following DC for continued EPO supplementation. -Will also order IV iron.  Status post recent transmetatarsal amputation - drainage noted from the foot, per pt it is looking better - pt has completed 3 weeks of ABX - Follow up with Dr. Sharol Given as scheduled. -Has been seen by the wound  care RN --> appreciate her recommendations.  DM (diabetes mellitus), type 2, uncontrolled  - check A1C once H/H more stable  - will hold Lantus for now as pt was hypoglycemic. - will continue SSI  -May need to restart lantus soon.  HTN (hypertension)  - accelerated, placed on hydralazine scheduled and as needed  - reasonable control on current regimen   Seizure disorder  - continue Keppra   Hyponatremia  - chronic - recheck BMP in AM  Consultants:  GI Hematology   Procedures/Studies:  None  Antibiotics:  None   Code Status: Full  Family Communication: Pt at bedside  Disposition Plan: Home when medically stable. Likely in 24 hours.  HPI/Subjective: No events overnight.   Objective: Filed Vitals:   06/22/13 1650 06/22/13 1713 06/22/13 2115 06/23/13 0500  BP: 157/107 145/95 119/67 128/75  Pulse:  88 89 103  Temp:  98.6 F (37 C) 97.9 F (36.6 C) 97.4 F (36.3 C)  TempSrc:  Oral Oral Oral  Resp: 11 16 16 18   Height:      Weight:    75.836 kg (167 lb 3 oz)  SpO2: 100% 100% 100% 100%    Intake/Output Summary (Last 24 hours) at 06/23/13 0957 Last data filed at 06/23/13 0901  Gross per 24 hour  Intake 1146.83 ml  Output   1650 ml  Net -503.17 ml    Exam:   General:  Pt is alert, follows commands appropriately, not in acute distress  Cardiovascular: Regular rhythm, tachycardic, S1/S2, no murmurs, no rubs, no  gallops  Respiratory: Clear to auscultation bilaterally, no wheezing, no crackles, no rhonchi  Abdomen: Soft, non tender, non distended, bowel sounds present, no guarding  Extremities: status post recent transmetatarsal amputation left foot; dressed in clean dressing.  Neuro: Grossly nonfocal  Data Reviewed: Basic Metabolic Panel:  Recent Labs Lab 06/17/13 1620 06/20/13 1320 06/21/13 0350 06/23/13 0404  NA 130* 131* 135 130*  K 4.5 4.9 4.5 4.0  CL 99 104 107 102  CO2 22 19 20 19   GLUCOSE 355* 258* 85 170*  BUN 30* 39* 42* 25*   CREATININE 1.49* 1.47* 1.42* 1.42*  CALCIUM 8.8 8.1* 8.0* 8.0*   Liver Function Tests:  Recent Labs Lab 06/20/13 1320  AST 22  ALT 15  ALKPHOS 109  BILITOT 0.2*  PROT 6.9  ALBUMIN 2.0*   CBC:  Recent Labs Lab 06/17/13 1620 06/20/13 1320 06/21/13 0350 06/22/13 0500 06/23/13 0406  WBC 7.4 6.5 5.9 5.0 5.0  NEUTROABS 5.1 4.4  --   --   --   HGB 7.5* 6.5* 5.4* 5.2* 5.3*  HCT 23.1* 19.6* 16.3* 15.9* 16.3*  MCV 86.5 87.1 86.2 86.4 87.6  PLT 298 326 267 241 259   CBG:  Recent Labs Lab 06/22/13 1542 06/22/13 1644 06/22/13 2158 06/23/13 0044 06/23/13 0753  GLUCAP 117* 114* 369* 376* 170*     Scheduled Meds: . carvedilol  6.25 mg Oral BID WC  . DULoxetine  60 mg Oral Daily  . feeding supplement (GLUCERNA SHAKE)  237 mL Oral TID WC  . ferrous sulfate  325 mg Oral TID WC  . hydrALAZINE  25 mg Oral Q8H  . hydrochlorothiazide  12.5 mg Oral Daily  . insulin aspart  0-15 Units Subcutaneous TID WC  . iron dextran (INFED/DEXFERRUM) infusion  500 mg Intravenous Once  . levETIRAcetam  1,000 mg Oral QHS  . polyethylene glycol  17 g Oral Daily  . senna-docusate  2 tablet Oral QHS  . zolpidem  5 mg Oral QHS   Continuous Infusions: . sodium chloride 20 mL/hr at 06/23/13 0511   Lelon Frohlich, MD  [facility] Pager [phone]  If 7PM-7AM, please contact night-coverage [email or web address] Password [email or web address] 06/23/2013, 9:57 AM   LOS: 3 days

## 2013-06-23 NOTE — Progress Notes (Signed)
[name] 10:35 AM  Subjective: Patient without signs of bleeding or problems from colonoscopy and the results were rediscussed and she has no new complaints and case discussed with hospital team  Objective: Vital signs stable afebrile no acute distress abdomen is soft nontender hemoglobin stable  Assessment: Anemia and bright red blood per rectum secondary to hemorrhoids  Plan: Please let me know if I can be of any further assistance with during this hospital stay  [facility]

## 2013-06-23 NOTE — Progress Notes (Signed)
Inpatient Diabetes Program Recommendations  AACE/ADA: New Consensus Statement on Inpatient Glycemic Control (2013)  Target Ranges:  Prepandial:   less than 140 mg/dL      Peak postprandial:   less than 180 mg/dL (1-2 hours)      Critically ill patients:  140 - 180 mg/dL   Reason for Visit: Hyperglycemia   Results for LAVAUN, WOLFENDEN (MRN [id number]) as of 06/23/2013 16:58  Ref. Range 06/22/2013 21:58 06/23/2013 00:44 06/23/2013 07:53 06/23/2013 11:56  Glucose-Capillary Latest Range: 70-99 mg/dL 369 (H) 376 (H) 170 (H) 249 (H)  Results for AUDELIA, LESSO (MRN [id number]) as of 06/23/2013 16:58  Ref. Range 06/23/2013 04:04  Sodium Latest Range: 135-145 mEq/L 130 (L)  Potassium Latest Range: 3.5-5.1 mEq/L 4.0  Chloride Latest Range: 96-112 mEq/L 102  CO2 Latest Range: 19-32 mEq/L 19  BUN Latest Range: 6-23 mg/dL 25 (H)  Creatinine Latest Range: 0.50-1.10 mg/dL 1.42 (H)  Calcium Latest Range: 8.4-10.5 mg/dL 8.0 (L)  GFR calc non Af Amer Latest Range: >90 mL/min 43 (L)  GFR calc Af Amer Latest Range: >90 mL/min 50 (L)  Glucose Latest Range: 70-99 mg/dL 170 (H)    Post-prandial hyperglycemia. Eating 100% meals  Recommendations: Add meal coverage insulin - Novolog 4 units tidwc if pt eats >50%. Low dose basal - Lantus 20 units QHS.  Will follow daily. Thank you. Lorenda Peck, RD, LDN, CDE Inpatient Diabetes Coordinator [phone]

## 2013-06-23 NOTE — Progress Notes (Signed)
Patient c/o pain to lt foot unresolved per percocet and requested stronger pain med. Also c/o appearance of lt foot and odor. Sutures intact to lt foot with opened areas along suture line. Voiced concern that she was on po antibiotic prior admission. Dr. Jerilee Hoh requested she be called to assess wound when dressing changed but she was not able to come at that time. Will see patient in the a.m. Howard Pouch PA paged for pain med with call returned. New orders placed via computer & she will refer wound care to MD in a.m. Patient notified of her concerns follow-uped with PA.

## 2013-06-23 NOTE — Progress Notes (Signed)
Notified K Schorr, NP of CBG revealed 369.  Order received.

## 2013-06-24 LAB — GLUCOSE, CAPILLARY
Glucose-Capillary: 150 mg/dL — ABNORMAL HIGH (ref 70–99)
Glucose-Capillary: 196 mg/dL — ABNORMAL HIGH (ref 70–99)
Glucose-Capillary: 222 mg/dL — ABNORMAL HIGH (ref 70–99)
Glucose-Capillary: 227 mg/dL — ABNORMAL HIGH (ref 70–99)
Glucose-Capillary: 232 mg/dL — ABNORMAL HIGH (ref 70–99)

## 2013-06-24 LAB — BASIC METABOLIC PANEL
BUN: 31 mg/dL — ABNORMAL HIGH (ref 6–23)
CO2: 21 mEq/L (ref 19–32)
Calcium: 7.8 mg/dL — ABNORMAL LOW (ref 8.4–10.5)
Chloride: 103 mEq/L (ref 96–112)
Creatinine, Ser: 1.6 mg/dL — ABNORMAL HIGH (ref 0.50–1.10)

## 2013-06-24 LAB — CBC
HCT: 14.4 % — ABNORMAL LOW (ref 36.0–46.0)
Hemoglobin: 4.7 g/dL — CL (ref 12.0–15.0)
MCH: 29.2 pg (ref 26.0–34.0)
MCHC: 32.6 g/dL (ref 30.0–36.0)
MCV: 89.4 fL (ref 78.0–100.0)
Platelets: 191 K/uL (ref 150–400)
RBC: 1.61 MIL/uL — ABNORMAL LOW (ref 3.87–5.11)
RDW: 16.5 % — ABNORMAL HIGH (ref 11.5–15.5)
WBC: 4.7 K/uL (ref 4.0–10.5)

## 2013-06-24 MED ORDER — SILVER SULFADIAZINE 1 % EX CREA
TOPICAL_CREAM | Freq: Every day | CUTANEOUS | Status: DC
  Administered 2013-06-24: 1 via TOPICAL
  Administered 2013-06-25: 12:00:00 via TOPICAL
  Filled 2013-06-24: qty 85

## 2013-06-24 NOTE — Consult Note (Signed)
Reason for Consult: Wound left midfoot amputation Referring Physician: [name] is an 47 y.o. female.  HPI: Patient is a 47 year old woman with diabetic insensate neuropathy end-stage renal disease hypertension who presents with severe anemia with breakdown of the wound left foot  Past Medical History  Diagnosis Date  . Neuropathy   . Glaucoma   . Bipolar 1 disorder   . CHF (congestive heart failure)   . Refusal of blood transfusions as patient is Jehovah's Witness   . Hypertension   . Shortness of breath   . Diabetes mellitus     INSULIN DEPENDENT  . TIA (transient ischemic attack)   . Neuromuscular disorder     DIABETIC NEUROPATHY  . Chronic kidney disease   . Hepatitis C virus     Past Surgical History  Procedure Laterality Date  . Abdominal hysterectomy    . Amputation Left 05/21/2013    Procedure: Left Midfoot AMPUTATION;  Surgeon: Newt Minion, MD;  Location: [place];  Service: Orthopedics;  Laterality: Left;  Left Midfoot Amputation  . Tubal ligation    . Colonoscopy with propofol N/A 06/22/2013    Procedure: COLONOSCOPY WITH PROPOFOL;  Surgeon: Jeryl Columbia, MD;  Location: [facility];  Service: Endoscopy;  Laterality: N/A;    History reviewed. No pertinent family history.  Social History:  reports that she quit smoking about 7 weeks ago. She has never used smokeless tobacco. She reports that she does not drink alcohol or use illicit drugs.  Allergies:  Allergies  Allergen Reactions  . Gabapentin Itching and Swelling  . Penicillins Hives    Medications: I have reviewed the patient's current medications.  Results for orders placed during the hospital encounter of 06/20/13 (from the past 48 hour(s))  GLUCOSE, CAPILLARY     Status: Abnormal   Collection Time    06/22/13  9:58 PM      Result Value Range   Glucose-Capillary 369 (*) 70 - 99 mg/dL  GLUCOSE, CAPILLARY     Status: Abnormal   Collection Time    06/23/13 12:44 AM      Result Value  Range   Glucose-Capillary 376 (*) 70 - 99 mg/dL  BASIC METABOLIC PANEL     Status: Abnormal   Collection Time    06/23/13  4:04 AM      Result Value Range   Sodium 130 (*) 135 - 145 mEq/L   Potassium 4.0  3.5 - 5.1 mEq/L   Chloride 102  96 - 112 mEq/L   CO2 19  19 - 32 mEq/L   Glucose, Bld 170 (*) 70 - 99 mg/dL   BUN 25 (*) 6 - 23 mg/dL   Creatinine, Ser 1.42 (*) 0.50 - 1.10 mg/dL   Calcium 8.0 (*) 8.4 - 10.5 mg/dL   GFR calc non Af Amer 43 (*) >90 mL/min   GFR calc Af Amer 50 (*) >90 mL/min   Comment: (NOTE)     The eGFR has been calculated using the CKD EPI equation.     This calculation has not been validated in all clinical situations.     eGFR's persistently <90 mL/min signify possible Chronic Kidney     Disease.  CBC     Status: Abnormal   Collection Time    06/23/13  4:06 AM      Result Value Range   WBC 5.0  4.0 - 10.5 K/uL   RBC 1.86 (*) 3.87 - 5.11 MIL/uL   Hemoglobin 5.3 (*)  12.0 - 15.0 g/dL   Comment: REPEATED TO VERIFY     CRITICAL VALUE NOTED.  VALUE IS CONSISTENT WITH PREVIOUSLY REPORTED AND CALLED VALUE.   HCT 16.3 (*) 36.0 - 46.0 %   MCV 87.6  78.0 - 100.0 fL   MCH 28.5  26.0 - 34.0 pg   MCHC 32.5  30.0 - 36.0 g/dL   RDW 16.2 (*) 11.5 - 15.5 %   Platelets 259  150 - 400 K/uL  GLUCOSE, CAPILLARY     Status: Abnormal   Collection Time    06/23/13  7:53 AM      Result Value Range   Glucose-Capillary 170 (*) 70 - 99 mg/dL  GLUCOSE, CAPILLARY     Status: Abnormal   Collection Time    06/23/13 11:56 AM      Result Value Range   Glucose-Capillary 249 (*) 70 - 99 mg/dL  GLUCOSE, CAPILLARY     Status: Abnormal   Collection Time    06/23/13  4:58 PM      Result Value Range   Glucose-Capillary 158 (*) 70 - 99 mg/dL  GLUCOSE, CAPILLARY     Status: Abnormal   Collection Time    06/23/13  9:30 PM      Result Value Range   Glucose-Capillary 171 (*) 70 - 99 mg/dL  BASIC METABOLIC PANEL     Status: Abnormal   Collection Time    06/24/13  4:13 AM      Result  Value Range   Sodium 131 (*) 135 - 145 mEq/L   Potassium 5.2 (*) 3.5 - 5.1 mEq/L   Comment: DELTA CHECK NOTED     REPEATED TO VERIFY     NO VISIBLE HEMOLYSIS   Chloride 103  96 - 112 mEq/L   CO2 21  19 - 32 mEq/L   Glucose, Bld 324 (*) 70 - 99 mg/dL   BUN 31 (*) 6 - 23 mg/dL   Creatinine, Ser 1.60 (*) 0.50 - 1.10 mg/dL   Calcium 7.8 (*) 8.4 - 10.5 mg/dL   GFR calc non Af Amer 37 (*) >90 mL/min   GFR calc Af Amer 43 (*) >90 mL/min   Comment: (NOTE)     The eGFR has been calculated using the CKD EPI equation.     This calculation has not been validated in all clinical situations.     eGFR's persistently <90 mL/min signify possible Chronic Kidney     Disease.  CBC     Status: Abnormal   Collection Time    06/24/13  4:13 AM      Result Value Range   WBC 4.7  4.0 - 10.5 K/uL   RBC 1.61 (*) 3.87 - 5.11 MIL/uL   Hemoglobin 4.7 (*) 12.0 - 15.0 g/dL   Comment: REPEATED TO VERIFY     CRITICAL VALUE NOTED.  VALUE IS CONSISTENT WITH PREVIOUSLY REPORTED AND CALLED VALUE.   HCT 14.4 (*) 36.0 - 46.0 %   MCV 89.4  78.0 - 100.0 fL   MCH 29.2  26.0 - 34.0 pg   MCHC 32.6  30.0 - 36.0 g/dL   RDW 16.5 (*) 11.5 - 15.5 %   Platelets 191  150 - 400 K/uL   Comment: SPECIMEN CHECKED FOR CLOTS     REPEATED TO VERIFY     DELTA CHECK NOTED  GLUCOSE, CAPILLARY     Status: Abnormal   Collection Time    06/24/13  7:51 AM      Result  Value Range   Glucose-Capillary 222 (*) 70 - 99 mg/dL  GLUCOSE, CAPILLARY     Status: Abnormal   Collection Time    06/24/13 12:05 PM      Result Value Range   Glucose-Capillary 150 (*) 70 - 99 mg/dL  GLUCOSE, CAPILLARY     Status: Abnormal   Collection Time    06/24/13  4:32 PM      Result Value Range   Glucose-Capillary 232 (*) 70 - 99 mg/dL    No results found.  Review of Systems  All other systems reviewed and are negative.   Blood pressure 136/80, pulse 100, temperature 97.8 F (36.6 C), temperature source Oral, resp. rate 18, height 5\' 4"  (1.626 m),  weight 75.836 kg (167 lb 3 oz), SpO2 99.00%. Physical Exam On examination the wound is approximately 10 mm in diameter of the mid transverse metatarsal amputation. There is good granulation tissue the base no ischemia no cellulitis no purulent drainage no signs of infection. Assessment/Plan: Assessment: Wound breakdown left midfoot amputation. Plan will have her continue with Silvadene dressing changes continue with minimize weightbearing. With patient's poor medical status and severe anemia do not feel she is safe to be discharged to home anticipate she will need short-term skilled nursing placement.  [name] V 06/24/2013, 7:18 PM

## 2013-06-24 NOTE — Progress Notes (Signed)
Patient ID: [name], female   DOB: 1966-05-31, 47 y.o.   MRN: [id number] [facility] PROGRESS NOTE  GEORGAN REISE [id number] DOB: [birth year] DOA: 06/20/2013 PCP: Maurice Small, D, MD  Brief narrative:  Pt is 47 yo female with very complex and multiple medical conditions who presented to [facility] ED with main concern of progressive generalized weakness that she initially noted several days prior to this admission associated with several episodes of bright red blood per rectum. Pt also explains she feels lightheaded with ambulation but no specific symptoms such as headache, visual changes, no focal neurological symptoms. Pt denies similar events in the past, no chest pain or shortness of breath, no abdominal or urinary concerns.   In ED, CBC revealed Hg 6.5 which is down from recent CBC in November 7.5. Pt is Jehovah's witness and is refusing blood transfusion.   Assessment and Plan:   Generalized weakness  - this is likely multifactorial and secondary to acute on chronic blood loss anemia, progressive failure to thrive and deconditioning  - pt is Jehovah's witness and therefore no blood products given to date - PT evaluation requested.  CKD (chronic kidney disease) stage 3, GFR 30-59 ml/min  - Cr appears to be at baseline   Refusal of blood transfusions as patient is Jehovah's Witness   Normocytic anemia  -Due to AOCD, acute blood loss and CKD. - possibly related to hemorrhoids as per colonoscopy on 11/18. - hematology consult appreciated as well. - pt given one dose of Procrit 06/21/2013, ans will follow up with heme/onc following DC for continued EPO supplementation. -Will also order IV iron. -Hb is 4.7 11/20. Discussed with patient low Hb levels; she states "I would rather die than have a blood transfusion".  Status post recent transmetatarsal amputation - pt has completed 3 weeks of ABX - Have requested Dr. Sharol Given see patient while in the hospital as patient is very concerned  about the healing of her wound. -Has been seen by the wound care RN --> appreciate her recommendations.  DM (diabetes mellitus), type 2, uncontrolled  - will hold Lantus for now as pt was hypoglycemic. - will continue SSI  -May need to restart lantus soon if CBGs continue to increase.  HTN (hypertension)  - reasonable control on current regimen   Seizure disorder  - continue Keppra   Hyponatremia  - chronic - stable  Consultants:  GI Hematology   Procedures/Studies:  None  Antibiotics:  None   Code Status: Full  Family Communication: Pt at bedside  Disposition Plan: Home when medically stable. Likely in 24 hours.  HPI/Subjective: No events overnight.   Objective: Filed Vitals:   06/23/13 1400 06/23/13 2136 06/24/13 0520 06/24/13 1304  BP: 118/72 131/65 122/62 132/85  Pulse: 96 97 95   Temp: 98.3 F (36.8 C) 98.7 F (37.1 C) 98.2 F (36.8 C)   TempSrc: Oral Oral Oral   Resp: 18 18 16    Height:      Weight:      SpO2: 100% 100% 100%     Intake/Output Summary (Last 24 hours) at 06/24/13 1316 Last data filed at 06/24/13 1000  Gross per 24 hour  Intake    400 ml  Output   2850 ml  Net  -2450 ml    Exam:   General:  Pt is alert, follows commands appropriately, not in acute distress  Cardiovascular: Regular rhythm, tachycardic, S1/S2, no murmurs, no rubs, no gallops  Respiratory: Clear to auscultation bilaterally, no wheezing,  no crackles, no rhonchi  Abdomen: Soft, non tender, non distended, bowel sounds present, no guarding  Extremities: status post recent transmetatarsal amputation left foot; dressed in clean dressing.  Neuro: Grossly nonfocal  Data Reviewed: Basic Metabolic Panel:  Recent Labs Lab 06/17/13 1620 06/20/13 1320 06/21/13 0350 06/23/13 0404 06/24/13 0413  NA 130* 131* 135 130* 131*  K 4.5 4.9 4.5 4.0 5.2*  CL 99 104 107 102 103  CO2 22 19 20 19 21   GLUCOSE 355* 258* 85 170* 324*  BUN 30* 39* 42* 25* 31*  CREATININE  1.49* 1.47* 1.42* 1.42* 1.60*  CALCIUM 8.8 8.1* 8.0* 8.0* 7.8*   Liver Function Tests:  Recent Labs Lab 06/20/13 1320  AST 22  ALT 15  ALKPHOS 109  BILITOT 0.2*  PROT 6.9  ALBUMIN 2.0*   CBC:  Recent Labs Lab 06/17/13 1620 06/20/13 1320 06/21/13 0350 06/22/13 0500 06/23/13 0406 06/24/13 0413  WBC 7.4 6.5 5.9 5.0 5.0 4.7  NEUTROABS 5.1 4.4  --   --   --   --   HGB 7.5* 6.5* 5.4* 5.2* 5.3* 4.7*  HCT 23.1* 19.6* 16.3* 15.9* 16.3* 14.4*  MCV 86.5 87.1 86.2 86.4 87.6 89.4  PLT 298 326 267 241 259 191   CBG:  Recent Labs Lab 06/23/13 1156 06/23/13 1658 06/23/13 2130 06/24/13 0751 06/24/13 1205  GLUCAP 249* 158* 171* 222* 150*     Scheduled Meds: . carvedilol  6.25 mg Oral BID WC  . DULoxetine  60 mg Oral Daily  . feeding supplement (GLUCERNA SHAKE)  237 mL Oral TID WC  . ferrous sulfate  325 mg Oral TID WC  . hydrALAZINE  25 mg Oral Q8H  . hydrochlorothiazide  12.5 mg Oral Daily  . insulin aspart  0-15 Units Subcutaneous TID WC  . levETIRAcetam  1,000 mg Oral QHS  . polyethylene glycol  17 g Oral Daily  . senna-docusate  2 tablet Oral QHS  . zolpidem  5 mg Oral QHS   Continuous Infusions: . sodium chloride 20 mL/hr at 06/23/13 1800   Lelon Frohlich, MD  [facility] Pager [phone]  If 7PM-7AM, please contact night-coverage [email or web address] Password [email or web address] 06/24/2013, 1:16 PM   LOS: 4 days

## 2013-06-24 NOTE — ED Provider Notes (Signed)
Medical screening examination/treatment/procedure(s) were conducted as a shared visit with non-physician practitioner(s) and myself.  I personally evaluated the patient during the encounter.  EKG Interpretation    Date/Time:    Ventricular Rate:    PR Interval:    QRS Duration:   QT Interval:    QTC Calculation:   R Axis:     Text Interpretation:             47yf with rectal bleeding. Ongoing in ED. Anemic an decreased from most recent labs for comparison. Discussed with GI who will see in consultation. Admit to hospitalist.   Virgel Manifold, MD 06/24/13 [id number]

## 2013-06-25 DIAGNOSIS — D649 Anemia, unspecified: Secondary | ICD-10-CM

## 2013-06-25 DIAGNOSIS — D631 Anemia in chronic kidney disease: Secondary | ICD-10-CM

## 2013-06-25 DIAGNOSIS — D500 Iron deficiency anemia secondary to blood loss (chronic): Secondary | ICD-10-CM

## 2013-06-25 LAB — CBC
HCT: 16 % — ABNORMAL LOW (ref 36.0–46.0)
Hemoglobin: 5.1 g/dL — CL (ref 12.0–15.0)
MCH: 28.8 pg (ref 26.0–34.0)
MCV: 90.4 fL (ref 78.0–100.0)
Platelets: 226 10*3/uL (ref 150–400)
RBC: 1.77 MIL/uL — ABNORMAL LOW (ref 3.87–5.11)
WBC: 5.3 10*3/uL (ref 4.0–10.5)

## 2013-06-25 LAB — GLUCOSE, CAPILLARY: Glucose-Capillary: 193 mg/dL — ABNORMAL HIGH (ref 70–99)

## 2013-06-25 MED ORDER — DIPHENHYDRAMINE HCL 25 MG PO CAPS
25.0000 mg | ORAL_CAPSULE | ORAL | Status: DC | PRN
  Administered 2013-06-25: 25 mg via ORAL

## 2013-06-25 MED ORDER — FERROUS SULFATE 325 (65 FE) MG PO TABS: 325.0000 mg | ORAL_TABLET | Freq: Three times a day (TID) | ORAL | Status: DC

## 2013-06-25 MED ORDER — DIPHENHYDRAMINE HCL 25 MG PO CAPS
ORAL_CAPSULE | ORAL | Status: AC
  Filled 2013-06-25: qty 1

## 2013-06-25 MED ORDER — DARBEPOETIN ALFA-POLYSORBATE 40 MCG/0.4ML IJ SOLN
40.0000 ug | Freq: Once | INTRAMUSCULAR | Status: AC
  Administered 2013-06-25: 40 ug via SUBCUTANEOUS
  Filled 2013-06-25: qty 0.4

## 2013-06-25 NOTE — Progress Notes (Signed)
[name]   DOB:1966/05/17   [id number]   [id number]  Subjective: has never been able to get her room warmed up; denies chest pain or pressure, SOB at rest; no over bleeding; no family in room   Objective: middle aged African American woman examined in bed Filed Vitals:   06/25/13 0551  BP: 129/78  Pulse: 97  Temp: 98.2 F (36.8 C)  Resp: 20    Body mass index is 30.06 kg/(m^2).  Intake/Output Summary (Last 24 hours) at 06/25/13 0748 Last data filed at 06/24/13 2137  Gross per 24 hour  Intake      0 ml  Output   2300 ml  Net  -2300 ml     No peripheral adenopathy  Lungs clear -- no rales or rhonchi--auscultated anterolaterally  Heart regular rate and rhythm  Abdomen soft, NT +BS  Neuro nonfocal  Breast exam: deferred  CBG (last 3)   Recent Labs  06/24/13 2133 06/24/13 2348 06/25/13 0723  GLUCAP 227* 196* 301*     Labs:  Results for DIYA, STANDERFER (MRN [id number]) as of 06/25/2013 07:45  Ref. Range 06/21/2013 03:50 06/22/2013 05:00 06/23/2013 04:06 06/24/2013 04:13 06/25/2013 05:00  Hemoglobin Latest Range: 12.0-15.0 g/dL 5.4 (LL) 5.2 (LL) 5.3 (LL) 4.7 (LL) 5.1 (LL)   Results for MORIAH, GUARNIERI (MRN [id number]) as of 06/25/2013 07:45  Ref. Range 02/10/2013 04:45 06/02/2013 15:35 06/21/2013 14:07  Retic Count, Manual Latest Range: 19.0-186.0 K/uL 171.7 86.4 124.3   Lab Results  Component Value Date   WBC 5.3 06/25/2013   HGB 5.1* 06/25/2013   HCT 16.0* 06/25/2013   MCV 90.4 06/25/2013   PLT 226 06/25/2013   NEUTROABS 4.4 06/20/2013    @LASTCHEMISTRY @  Urine Studies No results found for this basename: UACOL, UAPR, USPG, UPH, UTP, UGL, UKET, UBIL, UHGB, UNIT, UROB, ULEU, UEPI, UWBC, URBC, UBAC, CAST, CRYS, UCOM, BILUA,  in the last 72 hours  Basic Metabolic Panel:  Recent Labs Lab 06/20/13 1320 06/21/13 0350 06/23/13 0404 06/24/13 0413  NA 131* 135 130* 131*  K 4.9 4.5 4.0 5.2*  CL 104 107 102 103  CO2 19 20 19 21   GLUCOSE 258* 85 170* 324*   BUN 39* 42* 25* 31*  CREATININE 1.47* 1.42* 1.42* 1.60*  CALCIUM 8.1* 8.0* 8.0* 7.8*   GFR Estimated Creatinine Clearance: 44.3 ml/min (by C-G formula based on Cr of 1.6). Liver Function Tests:  Recent Labs Lab 06/20/13 1320  AST 22  ALT 15  ALKPHOS 109  BILITOT 0.2*  PROT 6.9  ALBUMIN 2.0*   No results found for this basename: LIPASE, AMYLASE,  in the last 168 hours No results found for this basename: AMMONIA,  in the last 168 hours Coagulation profile No results found for this basename: INR, PROTIME,  in the last 168 hours  CBC:  Recent Labs Lab 06/20/13 1320 06/21/13 0350 06/22/13 0500 06/23/13 0406 06/24/13 0413 06/25/13 0500  WBC 6.5 5.9 5.0 5.0 4.7 5.3  NEUTROABS 4.4  --   --   --   --   --   HGB 6.5* 5.4* 5.2* 5.3* 4.7* 5.1*  HCT 19.6* 16.3* 15.9* 16.3* 14.4* 16.0*  MCV 87.1 86.2 86.4 87.6 89.4 90.4  PLT 326 267 241 259 191 226   Cardiac Enzymes: No results found for this basename: CKTOTAL, CKMB, CKMBINDEX, TROPONINI,  in the last 168 hours BNP: No components found with this basename: POCBNP,  CBG:  Recent Labs Lab 06/24/13 1205 06/24/13 1632 06/24/13 2133 06/24/13  2348 06/25/13 0723  GLUCAP 150* 232* 227* 196* 301*   D-Dimer No results found for this basename: DDIMER,  in the last 72 hours Hgb A1c No results found for this basename: HGBA1C,  in the last 72 hours Lipid Profile No results found for this basename: CHOL, HDL, LDLCALC, TRIG, CHOLHDL, LDLDIRECT,  in the last 72 hours Thyroid function studies No results found for this basename: TSH, T4TOTAL, FREET3, T3FREE, THYROIDAB,  in the last 72 hours Anemia work up No results found for this basename: VITAMINB12, FOLATE, FERRITIN, TIBC, IRON, RETICCTPCT,  in the last 72 hours Microbiology No results found for this or any previous visit (from the past 240 hour(s)).    Studies:  No results found.  Assessment: 47 y.o. Jehovah's Witness residing in [place], with severe multifactorial  anemia secondary to chronic renal failure, anemia of chronic inflammation, and bleeding  (1) refuses transfusion even if lifesaving  (2) received darbepoietin 06/03/2013 and 06/21/2013  (3) received iron dextran (500 mg doses) 06/03/2013 and 06/23/2013  (4) ESR of 100 05/19/2013-- unexplained    Plan: her hemoglobin may be turning around (though minimally so far) and her reticulocyte count is again > 100. If she is discharged before 11/24 she should receive an additional dose of darbopoietin at discharge, otherwise the next dose will be due 11/24. She has follow-up in our office 07/08/2013 at 12:45 PM and we will pick up on her anemia treatment at that point.  I am not sure why her ESR is so high. Consider workup before discharge.  Will follow with you.   Chauncey Cruel, MD 06/25/2013  7:48 AM

## 2013-06-25 NOTE — Discharge Summary (Signed)
Physician Discharge Summary  [name] [id number] DOB: Dec 23, 1965 DOA: 06/20/2013  PCP: [name], D, MD  Admit date: 06/20/2013 Discharge date: 06/25/2013  Time spent: 45 minutes  Recommendations for Outpatient Follow-up:  -To SNF today for continued wound care and rehab. -Follow up with Dr. Sharol [name] as scheduled.   Discharge Diagnoses:  Principal Problem:   Generalized weakness Active Problems:   DM (diabetes mellitus), type 2, uncontrolled   HTN (hypertension), benign   Seizure disorder   CKD (chronic kidney disease) stage 3, GFR 30-59 ml/min   Hyponatremia   Refusal of blood transfusions as patient is Jehovah's Witness   Normocytic anemia   Discharge Condition: Stable and improved  Filed Weights   06/22/13 0603 06/23/13 0500 06/25/13 0551  Weight: 75.161 kg (165 lb 11.2 oz) 75.836 kg (167 lb 3 oz) 79.47 kg (175 lb 3.2 oz)    History of present illness:  Pt is 47 yo female with very complex and multiple medical conditions who presented to [facility] ED with main concern of progressive generalized weakness that she initially noted several days prior to this admission associated with several episodes of bright red blood per rectum. Pt also explains she feels lightheaded with ambulation but no specific symptoms such as headache, visual changes, no focal neurological symptoms. Pt denies similar events in the past, no chest pain or shortness of breath, no abdominal or urinary concerns.  In ED, CBC revealed Hg 6.5 which is down from recent CBC in November 7.5. Pt is Jehovah's witness and is refusing blood transfusion.  We were asked to admit her for further evaluation and management.   Hospital Course:   Generalized weakness  - this is likely multifactorial and secondary to acute on chronic blood loss anemia, progressive failure to thrive and deconditioning  - pt is Jehovah's witness and therefore no blood products [name] to date  - PT evaluation requested.   CKD (chronic kidney  disease) stage 3, GFR 30-59 ml/min  - Cr appears to be at baseline   Refusal of blood transfusions as patient is Jehovah's Witness   Normocytic anemia  -Due to AOCD, acute blood loss and CKD.  - possibly related to hemorrhoids as per colonoscopy on 11/18.  - hematology consult appreciated as well.  - pt [name] one dose of Procrit 06/21/2013, will receive another dose prior to D, and will follow up with heme/onc following DC for continued EPO supplementation.  -Received IV iron on 11/19. -Hb is 4.7 11/20. Up to 5.3 on 11/21. Discussed with patient low Hb levels; she states "I would rather die than have a blood transfusion". (she is a Restaurant manager, fast food).  Status post recent transmetatarsal amputation  - pt has completed 3 weeks of ABX  - Seen by Dr. Sharol [name] who has recommended silvadene gauze applied under an ACE wrap BID.  DM (diabetes mellitus), type 2, uncontrolled  - Continue lantus. Had been held for a few days due to hypoglycemia, but her CBGs are high now. - will continue SSI   HTN (hypertension)  - reasonable control on current regimen   Seizure disorder  - continue Keppra   Hyponatremia  - chronic  - stable    Procedures: Colonoscopy 11/18: FINDINGS: 1 no signs of GI bleeding in the colon 2 [name] internal  and external hemorrhoids with some [name] tear most likely source of  bright red blood per rectum3. Otherwise within normal limits to the  cecum however with poor prep lesions could have been missed however  no blood was seen    Consultations:  GI, [name]  Heme/Onc [name]  Discharge Instructions  Discharge Orders   Future Appointments Provider Department Dept Phone   07/08/2013 12:45 PM [place] [id number]   07/08/2013 1:15 PM Amy G Berry, [place] [phone]   07/08/2013 2:45 PM [facility] Nurse [place] [phone]    Future Orders Complete By Expires   Diet - low sodium heart healthy  As directed    Discontinue IV  As directed    Increase activity slowly  As directed        Medication List    STOP taking these medications       doxycycline 100 MG capsule  Commonly known as:  VIBRAMYCIN     oxyCODONE-acetaminophen 5-325 MG per tablet  Commonly known as:  PERCOCET/ROXICET      TAKE these medications       carvedilol 6.25 MG tablet  Commonly known as:  COREG  Take 1 tablet (6.25 mg total) by mouth 2 (two) times daily with a meal.     DULoxetine 60 MG capsule  Commonly known as:  CYMBALTA  Take 60 mg by mouth daily.     feeding supplement (GLUCERNA SHAKE) Liqd  Take 237 mLs by mouth 2 (two) times daily between meals.     ferrous sulfate 325 (65 FE) MG tablet  Take 1 tablet (325 mg total) by mouth 3 (three) times daily with meals.     hydrochlorothiazide 12.5 MG capsule  Commonly known as:  MICROZIDE  Take 1 capsule (12.5 mg total) by mouth daily.     insulin aspart 100 UNIT/ML injection  Commonly known as:  novoLOG  Inject 2-20 Units into the skin 3 (three) times daily before meals. Sliding scale     insulin glargine 100 UNIT/ML injection  Commonly known as:  LANTUS  Inject 0.4 mLs (40 Units total) into the skin 2 (two) times daily.     levETIRAcetam 1000 MG tablet  Commonly known as:  KEPPRA  Take 1,000 mg by mouth at bedtime.     MAGNESIUM PO  Take 1 tablet by mouth daily.     polyethylene glycol packet  Commonly known as:  MIRALAX / GLYCOLAX  Take 17 g by mouth daily.     senna-docusate 8.6-50 MG per tablet  Commonly known as:  Senokot-S  Take 2 tablets by mouth at bedtime.     vitamin C 500 MG tablet  Commonly known as:  ASCORBIC ACID  Take 500 mg by mouth daily.     zolpidem 6.25 MG CR tablet  Commonly known as:  AMBIEN CR  Take 6.25 mg by mouth at bedtime as needed for sleep.       Allergies  Allergen Reactions  . Gabapentin Itching and Swelling  .  Penicillins Hives      The results of significant diagnostics from this hospitalization (including imaging, microbiology, ancillary and laboratory) are listed below for reference.    Significant Diagnostic Studies: Dg Chest 2 View  06/01/2013   CLINICAL DATA:  Chest pain  EXAM: CHEST  2 VIEW  COMPARISON:  05/17/2013  FINDINGS: Cardiomediastinal silhouette is stable. No pulmonary edema. There is [name] right pleural effusion with right basilar atelectasis or infiltrate. Bony thorax is unremarkable.  IMPRESSION: No pulmonary edema. [name] right pleural effusion with right basilar atelectasis or infiltrate.   Electronically Signed  By: [name] M.D.   On: 06/01/2013 17:10   Mr Foot Left W Wo Contrast  06/03/2013   CLINICAL DATA:  Left foot swelling following amputation for osteomyelitis earlier this month. Patient fell injuring her foot after surgery. Renal insufficiency.  EXAM: MRI OF THE LEFT FOREFOOT WITHOUT AND WITH CONTRAST  TECHNIQUE: Multiplanar, multisequence MR imaging was performed both before and after administration of intravenous contrast.  CONTRAST:  50mL MULTIHANCE GADOBENATE DIMEGLUMINE 529 MG/ML IV SOLN  COMPARISON:  Radiographs 06/02/2013. Preoperative MRI 05/19/2013.  FINDINGS: Study is mildly motion degraded. The remaining foot is imaged.  The patient has undergone interval transmetatarsal amputation as demonstrated on the recent radiographs. There is a complex peripherally enhancing fluid collection within the surgical bed, measuring approximately 6.3 x 2.6 x 2.5 cm. Enhancing fluid tracks proximally along the extensor tendon sheaths. No other focal fluid collections are identified. There is diffuse subcutaneous edema within the ankle and hindfoot.  There is nonspecific low level edema within the amputated metatarsal bases. There is low-level edema and enhancement within the cuboid and cuneiform bones. No cortical destruction is identified. The talus, navicular and calcaneus appear  normal.  IMPRESSION: 1. Postsurgical changes status post recent transmetatarsal amputation. 2. There is a peripheral enhancing fluid collection within the surgical bed which is nonspecific this soon following surgery. This could reflect a postoperative hematoma/ seroma. An abscess cannot be excluded. 3. Nonspecific low-level marrow edema and enhancement within the partially amputated metatarsals as well as the cuboid and cuneiform bones. Some degree of hyperemia in these bones is expected related to the recent surgery, and there is no cortical destruction to strongly suggest recurrent osteomyelitis.   Electronically Signed   By: [name] M.D.   On: 06/03/2013 16:28   Nm Pulmonary Perf And Vent  06/01/2013   CLINICAL DATA:  Pulmonary embolism. Chest pain. Recent operation.  EXAM: NUCLEAR MEDICINE VENTILATION - PERFUSION LUNG SCAN  TECHNIQUE: Ventilation images were obtained in multiple projections using inhaled aerosol technetium 99 M DTPA. Perfusion images were obtained in multiple projections after intravenous injection of Tc-90m MAA.  COMPARISON:  Chest radiograph 06/01/2013.  RADIOPHARMACEUTICALS:  Forty mCi Tc-4m DTPA aerosol and 6 mCi Tc-80m MAA  FINDINGS: Ventilation: Patchy ventilation is present. Some of the radiotracer was swallowed and is present in the stomach. Particulate radiotracer outlines the trachea.  Perfusion: The perfusion study is normal.  IMPRESSION: Normal perfusion study.  No findings of pulmonary embolism.   Electronically Signed   By: [name] M.D.   On: 06/01/2013 21:30   Dg Foot 2 Views Left  06/02/2013   CLINICAL DATA:  Left foot pain and stump drainage.  EXAM: LEFT FOOT - 2 VIEW  COMPARISON:  MRI of the left foot May 19, 2013  FINDINGS: Status post transmetatarsal amputation, minimal osteopenia lung the lateral aspect of the medial cuneiform seen only on the frontal radiograph. No fracture deformity or dislocation. Type 2 navicular bone. Mild irregularity of the  stomach soft tissues Common a radiopaque foreign bodies ; ankle soft tissue swelling.  IMPRESSION: Status post transmetatarsal amputation, with expected postoperative changes; slight lucency along the lateral aspect of the medial cuneiform, which may be projectional though, if clinical concern for recurrent osteomyelitis consider repeat MRI of the foot. No acute fracture deformity or dislocation.   Electronically Signed   By: Elon Alas   On: 06/02/2013 00:50   Dg Foot Complete Left  06/17/2013   CLINICAL DATA:  Recent injury with Re opening of  surgical wound  EXAM: LEFT FOOT - COMPLETE 3+ VIEW  COMPARISON:  06/03/2013, 06/02/2013  FINDINGS: Soft tissue swelling is noted at the surgical wound. There is significant destruction of what is left of the metatarsals when compare with the prior exams. These changes are consistent with osteomyelitis. Clinical correlation is recommended. No acute fracture is noted.  IMPRESSION: Increasing destruction of the residual metatarsal bones suggestive of osteomyelitis.   Electronically Signed   By: Inez Catalina M.D.   On: 06/17/2013 16:54    Microbiology: No results found for this or any previous visit (from the past 240 hour(s)).   Labs: Basic Metabolic Panel:  Recent Labs Lab 06/20/13 1320 06/21/13 0350 06/23/13 0404 06/24/13 0413  NA 131* 135 130* 131*  K 4.9 4.5 4.0 5.2*  CL 104 107 102 103  CO2 19 20 19 21   GLUCOSE 258* 85 170* 324*  BUN 39* 42* 25* 31*  CREATININE 1.47* 1.42* 1.42* 1.60*  CALCIUM 8.1* 8.0* 8.0* 7.8*   Liver Function Tests:  Recent Labs Lab 06/20/13 1320  AST 22  ALT 15  ALKPHOS 109  BILITOT 0.2*  PROT 6.9  ALBUMIN 2.0*   No results found for this basename: LIPASE, AMYLASE,  in the last 168 hours No results found for this basename: AMMONIA,  in the last 168 hours CBC:  Recent Labs Lab 06/20/13 1320 06/21/13 0350 06/22/13 0500 06/23/13 0406 06/24/13 0413 06/25/13 0500  WBC 6.5 5.9 5.0 5.0 4.7 5.3   NEUTROABS 4.4  --   --   --   --   --   HGB 6.5* 5.4* 5.2* 5.3* 4.7* 5.1*  HCT 19.6* 16.3* 15.9* 16.3* 14.4* 16.0*  MCV 87.1 86.2 86.4 87.6 89.4 90.4  PLT 326 267 241 259 191 226   Cardiac Enzymes: No results found for this basename: CKTOTAL, CKMB, CKMBINDEX, TROPONINI,  in the last 168 hours BNP: BNP (last 3 results)  Recent Labs  06/01/13 1619  PROBNP 1210.0*   CBG:  Recent Labs Lab 06/24/13 1205 06/24/13 1632 06/24/13 2133 06/24/13 2348 06/25/13 0723  GLUCAP 150* 232* 227* 196* 301*       Signed:  HERNANDEZ ACOSTA,ESTELA  [facility] Pager: [phone] 06/25/2013, 10:32 AM

## 2013-06-25 NOTE — Progress Notes (Signed)
Clinical Social Work Department BRIEF PSYCHOSOCIAL ASSESSMENT 06/25/2013  Patient:  [name], [name]     Account Number:  [id number]     Admit date:  06/20/2013  Clinical Social Worker:  Lacie Scotts  Date/Time:  06/25/2013 01:51 PM  Referred by:  Physician  Date Referred:  06/25/2013 Referred for  SNF Placement   Other Referral:   Interview type:   Other interview type:    PSYCHOSOCIAL DATA Living Status:  FAMILY Admitted from facility:   Level of care:   Primary support name:  Margie Blacknell Primary support relationship to patient:  FAMILY Degree of support available:   unclear    CURRENT CONCERNS Current Concerns  Post-Acute Placement   Other Concerns:    SOCIAL WORK ASSESSMENT / PLAN Pt is a 47 yr old female living at home prior to hospitalization. CSW met with pt to assist with d/c planning. ST Rehab is needed following hospital d/c. Pt is in agreement with d/c plan. SNF search has been initiated and bed offers provided. Pt has chosen [facility] for rehab and will be d/c there today.   Assessment/plan status:  Psychosocial Support/Ongoing Assessment of Needs Other assessment/ plan:   Information/referral to community resources:   SNF list with bed offers provided. Insurance coverage for SNF reviewed. Pt is aware that insurance may not cover ambulance transport to SNF.    PATIENT'S/FAMILY'S RESPONSE TO PLAN OF CARE: " My mother was a pt at [facility] and she liked it there ". Pt is looking forward to having rehab .   Werner Lean LCSW [phone]

## 2013-06-25 NOTE — Progress Notes (Signed)
Inpatient Diabetes Program Recommendations  AACE/ADA: New Consensus Statement on Inpatient Glycemic Control (2013)  Target Ranges:  Prepandial:   less than 140 mg/dL      Peak postprandial:   less than 180 mg/dL (1-2 hours)      Critically ill patients:  140 - 180 mg/dL   Reason for Visit: Hyperglycemia  Results for [name], [name] (MRN [id number]) as of 06/25/2013 09:31  Ref. Range 06/24/2013 07:51 06/24/2013 12:05 06/24/2013 16:32 06/24/2013 21:33 06/24/2013 23:48 06/25/2013 07:23  Glucose-Capillary Latest Range: 70-99 mg/dL 222 (H) 150 (H) 232 (H) 227 (H) 196 (H) 301 (H)   Recommendations: Add Lantus 20 units QHS Add Novolog 4 units tidwc if pt eats >50% meal.  Will continue to follow. Thank you. Lorenda Peck, RD, LDN, CDE Inpatient Diabetes Coordinator [phone]

## 2013-06-25 NOTE — Progress Notes (Signed)
Patient transferred to [facility] per Biomedical scientist via ambulance service. Was ready for transfer. Dsg cd&i to lt foot. No complaints voiced. Belongings carried with her. Report called to receiving nurse at facility

## 2013-06-25 NOTE — Evaluation (Signed)
Physical Therapy Evaluation Patient Details Name: [name] MRN: [id number] DOB: 03/17/1966 Today's Date: 06/25/2013 Time: [id number] PT Time Calculation (min): 8 min  PT Assessment / Plan / Recommendation History of Present Illness  47 yo female admitted with generalized weakness, anemia. 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  Clinical Impression  On eval, pt required Min guard assist for mobility-able to ambulate ~7 feet with walker-distance limited by pain. Pt states plan is for ST rehab at [facility] prior to returning home.     PT Assessment  Patient needs continued PT services    Follow Up Recommendations  SNF    Does the patient have the potential to tolerate intense rehabilitation      Barriers to Discharge Decreased caregiver support      Equipment Recommendations  None recommended by PT    Recommendations for Other Services     Frequency Min 3X/week    Precautions / Restrictions Precautions Precautions: Fall Restrictions Weight Bearing Restrictions: Yes LLE Weight Bearing: Partial weight bearing   Pertinent Vitals/Pain 5/10 L foot with activity.       Mobility  Bed Mobility Bed Mobility: Supine to Sit;Sit to Supine Supine to Sit: 7: Independent Sit to Supine: 7: Independent Transfers Transfers: Sit to Stand;Stand to Sit Sit to Stand: 5: Supervision;From bed Stand to Sit: 5: Supervision;To bed Details for Transfer Assistance: Verbal cues for hand placement, technique Ambulation/Gait Ambulation/Gait Assistance: 4: Min guard Ambulation Distance (Feet): 7 Feet Assistive device: Rolling walker Ambulation/Gait Assistance Details: VCs safety, adherence to WB status. Pt c/o L foot pain so ambulation distance was limited.  Gait Pattern: Step-to pattern    Exercises     PT Diagnosis: Abnormality of gait;Difficulty walking;Acute pain  PT Problem List: Decreased activity tolerance;Decreased balance;Decreased mobility;Decreased knowledge of precautions PT Treatment  Interventions: DME instruction;Gait training;Functional mobility training;Therapeutic exercise;Therapeutic activities;Patient/family education     PT Goals(Current goals can be found in the care plan section) Acute Rehab PT Goals Patient Stated Goal: regain independence PT Goal Formulation: With patient Time For Goal Achievement: 07/09/13 Potential to Achieve Goals: Good  Visit Information  Last PT Received On: 06/25/13 Assistance Needed: +1 History of Present Illness: 47 yo female admitted with generalized weakness, anemia. 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       Prior Functioning  Home Living Family/patient expects to be discharged to:: Skilled nursing facility    Cognition  Cognition Arousal/Alertness: Awake/alert Behavior During Therapy: [facility] for tasks assessed/performed Overall Cognitive Status: Within Functional Limits for tasks assessed    Extremity/Trunk Assessment Upper Extremity Assessment Upper Extremity Assessment: Overall WFL for tasks assessed Lower Extremity Assessment RLE Sensation: history of peripheral neuropathy LLE Sensation: history of peripheral neuropathy Cervical / Trunk Assessment Cervical / Trunk Assessment: Normal   Balance Balance Balance Assessed: Yes Static Standing Balance Static Standing - Balance Support: Bilateral upper extremity supported Static Standing - Level of Assistance: 5: Stand by assistance Dynamic Standing Balance Dynamic Standing - Balance Support: Bilateral upper extremity supported Dynamic Standing - Level of Assistance: 5: Stand by assistance  End of Session PT - End of Session Activity Tolerance: Patient limited by pain Patient left: in bed;with call [place]/phone within reach  GP     Weston Anna, MPT Pager: [phone]

## 2013-06-25 NOTE — Progress Notes (Signed)
Clinical Social Work Department CLINICAL SOCIAL WORK PLACEMENT NOTE 06/25/2013  Patient:  [name], [name]  Account Number:  [id number] Admit date:  06/20/2013  Clinical Social Worker:  Werner Lean, LCSW  Date/time:  06/25/2013 02:03 PM  Clinical Social Work is seeking post-discharge placement for this patient at the following level of care:   SKILLED NURSING   (*CSW will update this form in Epic as items are completed)   06/25/2013  Patient/family provided with [place] Department of Clinical Social Work's list of facilities offering this level of care within the geographic area requested by the patient (or if unable, by the patient's family).  06/25/2013  Patient/family informed of their freedom to choose among providers that offer the needed level of care, that participate in Medicare, Medicaid or managed care program needed by the patient, have an available bed and are willing to accept the patient.    Patient/family informed of [facility]' ownership interest in [facility], as well as of the fact that they are under no obligation to receive care at this facility.  PASARR submitted to EDS on 06/25/2013 PASARR number received from EDS on 06/25/2013  FL2 transmitted to all facilities in geographic area requested by pt/family on  06/25/2013 FL2 transmitted to all facilities within larger geographic area on   Patient informed that his/her managed care company has contracts with or will negotiate with  certain facilities, including the following:     Patient/family informed of bed offers received:  06/25/2013 Patient chooses bed at [facility] Physician recommends and patient chooses bed at    Patient to be transferred to [facility] on  06/25/2013 Patient to be transferred to facility by [facility]  The following physician request were entered in Epic:   Additional Comments:   Werner Lean LCSW [phone]

## 2013-07-08 ENCOUNTER — Encounter: Payer: Self-pay | Admitting: Physician Assistant

## 2013-07-08 ENCOUNTER — Ambulatory Visit: Payer: Medicare Other | Admitting: Physician Assistant

## 2013-07-08 ENCOUNTER — Ambulatory Visit: Payer: Medicare Other

## 2013-07-08 ENCOUNTER — Other Ambulatory Visit: Payer: Medicare Other | Admitting: Lab

## 2013-07-08 ENCOUNTER — Telehealth: Payer: Self-pay | Admitting: *Deleted

## 2013-07-08 NOTE — Progress Notes (Signed)
[facility] today.  Letter mailed to patient.  Micah Flesher, PA-C 07/08/2013

## 2013-07-08 NOTE — Telephone Encounter (Signed)
Patient is not here for appointment with PA-C. I called patient to see if she was coming to appt, unable to reach her. I Talked to her sister, Marlowe Kays. She said she would let her sister know about the missed  appointment today. The patient no longer lives with the sister. The patient doesn't have a current phone number that we can reach her at according to the sister.

## 2013-07-09 ENCOUNTER — Telehealth: Payer: Self-pay | Admitting: Physician Assistant

## 2013-07-09 NOTE — Telephone Encounter (Signed)
Sent letter to patient from [employer] PA.

## 2013-07-12 ENCOUNTER — Telehealth: Payer: Self-pay | Admitting: Oncology

## 2013-07-12 NOTE — Telephone Encounter (Signed)
pt called to r/s 12/4 appts and was given new appts for 12/8.

## 2013-07-19 ENCOUNTER — Telehealth: Payer: Self-pay | Admitting: Oncology

## 2013-07-19 NOTE — Telephone Encounter (Signed)
lvm for pt confirming 12.16. appt d/t

## 2013-07-20 ENCOUNTER — Ambulatory Visit (HOSPITAL_BASED_OUTPATIENT_CLINIC_OR_DEPARTMENT_OTHER): Payer: Medicare Other | Admitting: Physician Assistant

## 2013-07-20 ENCOUNTER — Ambulatory Visit: Payer: Medicare Other

## 2013-07-20 ENCOUNTER — Encounter: Payer: Self-pay | Admitting: Physician Assistant

## 2013-07-20 ENCOUNTER — Telehealth: Payer: Self-pay | Admitting: Oncology

## 2013-07-20 ENCOUNTER — Other Ambulatory Visit (HOSPITAL_BASED_OUTPATIENT_CLINIC_OR_DEPARTMENT_OTHER): Payer: Medicare Other

## 2013-07-20 VITALS — BP 186/118 | HR 99 | Temp 97.7°F | Resp 18 | Ht 64.0 in | Wt 170.6 lb

## 2013-07-20 DIAGNOSIS — K922 Gastrointestinal hemorrhage, unspecified: Secondary | ICD-10-CM

## 2013-07-20 DIAGNOSIS — D500 Iron deficiency anemia secondary to blood loss (chronic): Secondary | ICD-10-CM

## 2013-07-20 DIAGNOSIS — Z531 Procedure and treatment not carried out because of patient's decision for reasons of belief and group pressure: Secondary | ICD-10-CM

## 2013-07-20 DIAGNOSIS — I10 Essential (primary) hypertension: Secondary | ICD-10-CM

## 2013-07-20 DIAGNOSIS — D631 Anemia in chronic kidney disease: Secondary | ICD-10-CM

## 2013-07-20 DIAGNOSIS — D649 Anemia, unspecified: Secondary | ICD-10-CM

## 2013-07-20 DIAGNOSIS — B1920 Unspecified viral hepatitis C without hepatic coma: Secondary | ICD-10-CM

## 2013-07-20 LAB — CBC WITH DIFFERENTIAL/PLATELET
BASO%: 0.7 % (ref 0.0–2.0)
Eosinophils Absolute: 0.1 10*3/uL (ref 0.0–0.5)
HCT: 28.8 % — ABNORMAL LOW (ref 34.8–46.6)
MCH: 29.4 pg (ref 25.1–34.0)
MCHC: 32.4 g/dL (ref 31.5–36.0)
MONO#: 0.3 10*3/uL (ref 0.1–0.9)
NEUT#: 2.3 10*3/uL (ref 1.5–6.5)
NEUT%: 45.9 % (ref 38.4–76.8)
RDW: 15.8 % — ABNORMAL HIGH (ref 11.2–14.5)
WBC: 5 10*3/uL (ref 3.9–10.3)
lymph#: 2.3 10*3/uL (ref 0.9–3.3)

## 2013-07-20 NOTE — Progress Notes (Signed)
ID: [name] OB: [birth year]  MR#: [id number]  [id number]  PCP: [name], MD GYN:   SU: Meridee Score, MD OTHER MD: Clarene Essex, MD  CHIEF COMPLAINT:  Anemia  HISTORY OF PRESENT ILLNESS: 47 year old [name], chronic normocytic anemia, chronic renal insufficiency and Hep CV Ab positive woman asked to see for evaluation of anemia. She was admitted on 06/20/2013 with progressive generalized weakness, worse 3 days prior to admission, accompanied by several episodes of bright red blood per rectum. In the emergency department, her CBC showed a hemoglobin of 6.5and hematocrit of 19.6, dropping from 7.5 and 23.1 on 06/17/2013 and 7.0/ 20.7 on 06/02/2013. Her white count and platelets were normal. Her BUN and creatinine were 39 and 1.47 respectively, in the setting of chronic renal insufficiency her bilirubin was 0.2. Albumin was 2.0. AST 7 ALT is with alkaline phosphatase were normal.Hemoccult was positive. GI was consulted to further evaluate the patient.she was on no NSAIDs, that she was an 81 mg of aspirin. Until admission, she never has had a colonoscopy.she is scheduled for colonoscopy and EGD on 06/22/2013, 2 aches any sources of bleeding.As of 06/21/2013, her hemoglobin reached a level of 5.4.  Hospitalist note from 05/18/13 reports history of crack/cocaine use. Important to mention that the patient was on no iron supplement as an outpatient, but she was on doxycycline for the treatment of seems 06/17/2013 following a recent left midfoot amputation in 05/21/2013.  We have been asked to see the patient in consultation, to help in the management of her anemia while further procedure as investigation is taking place. She is to receive Aranesp injection to help improve her levels.   Patient had never been evaluated for anemia by a hematologist. Her last IV Iron was received during hospitalization on 06/03/2013 although he initially received IV iron 2 years ago when she was  diagnosed with anemic state,she also had Aranesp injection, which per chart notes we have been last been received at the beginning of November .Never had a bone marrow biopsy. She admits to eat excessive amounts of ice chips, as well as high starch diet, dirt, clay,plaster, chalk and ashes. (?pica).no significant amount of coffee or iced tea. No heavy or irregular periods. No hemoptysis or epistaxis. No blood in urine Retic Count (absolute)was 86.4 on 06/02/2013. Last Fe levels on 06/02/13 were 60 , TIBC 281 , percent saturation 23 ,Ferritin 140 , B12 702, Folate 15.7. No SPEP or UPEP are available for review.sedimentation rate on 05/19/2013 was 100. Last HIV test on 02/09/2013 was non reactive.  Patient received Aranesp injections on 06/03/2013 06/21/2013. She also received IV iron (500 mg doses) on 06/03/2013 06/23/2013.  Subsequent history is as noted below.  INTERVAL HISTORY: Hana returns today for followup of her anemia, with a history of both iron deficiency and renal insufficiency. During her recent hospitalization, she received Aranesp injections in addition to IV iron. Her hemoglobin has recovered nicely, from 5.1 of 06/25/2013 29.3 today. MCV has improved slightly, coming up from 86.4 on 06/22/2013 to 90.5 today. She is taking oral iron on a daily basis with good tolerance.  Physically, [name] tells me she is feeling much better. Her energy level has improved. She currently denies any problems with shortness of breath. She's had no signs of abnormal bleeding.  Gerardo's blood pressure was elevated today.  It did improve very slightly after having her sit for a few minutes and relax. She tells [place] she "got all wound up" waiting  for her ride this afternoon, and thinking she was going to be late for her appointment. She is scheduled to see her primary care physician tomorrow for followup of her hypertension, and they have been making some recent changes in her medications per patient  report.   REVIEW OF SYSTEMS: Jemini also denies any recent illnesses or fevers. She sometimes has difficulty sleeping. She denies any abnormal headaches or dizziness but sometimes has blurred vision. She feels a little forgetful at times. She has poor circulation. She is status post recent left midfoot amputation under the care of Dr. Sharol Given.  She continues to be followed by his office, and is scheduled to see him again on 08/02/2013. She does continue to have some pain in the left foot which she tells me is "the same".  A detailed review of systems is otherwise stable and noncontributory.   PAST MEDICAL HISTORY: Past Medical History  Diagnosis Date  . Neuropathy   . Glaucoma   . Bipolar 1 disorder   . CHF (congestive heart failure)   . Refusal of blood transfusions as patient is Jehovah's [name]   . Hypertension   . Shortness of breath   . Diabetes mellitus     INSULIN DEPENDENT  . TIA (transient ischemic attack)   . Neuromuscular disorder     DIABETIC NEUROPATHY  . Chronic kidney disease   . Hepatitis C virus     PAST SURGICAL HISTORY: Past Surgical History  Procedure Laterality Date  . Abdominal hysterectomy    . Amputation Left 05/21/2013    Procedure: Left Midfoot AMPUTATION;  Surgeon: Newt Minion, MD;  Location: [facility];  Service: Orthopedics;  Laterality: Left;  Left Midfoot Amputation  . Tubal ligation    . Colonoscopy with propofol N/A 06/22/2013    Procedure: COLONOSCOPY WITH PROPOFOL;  Surgeon: Jeryl Columbia, MD;  Location: [facility];  Service: Endoscopy;  Laterality: N/A;    FAMILY HISTORY History reviewed. No pertinent family history. Mother died with diabetes. Father died with diabetes. Mother had a history of anemia as well. She has one sister who had sickle cell trait, and one cousin with sickle cell disease. One sister died with HIV. One sister died with CHF and CKD  GYNECOLOGIC HISTORY:  GX P3, status post remote hysterectomy  SOCIAL HISTORY: (Updated  07/20/2013) Single Lives in [place] 3 children, 2 boys and 1 girl.. No tobacco. Denies ETOH. No recreational drug use. May contact is her [name], [phone] and her niece Lorilyn Moncayo, [phone]. Full Code.currently unemployed. 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 Prior to that, she has worked in brief factories, loading trucks, and Consulting civil engineer. Originally she is From [place].     ADVANCED DIRECTIVES:    HEALTH MAINTENANCE:  (Updated 07/20/2013) History  Substance Use Topics  . Smoking status: Former Smoker -- 30 years    Quit date: 05/04/2013  . Smokeless tobacco: Never Used  . Alcohol Use: No     Colonoscopy: Nov 2014, Dr. Watt Climes  PAP: s/p hysterectomy  Bone density: Never  Lipid panel: Not on file  Allergies  Allergen Reactions  . Gabapentin Itching and Swelling  . Penicillins Hives    Current Outpatient Prescriptions  Medication Sig Dispense Refill  . carvedilol (COREG) 6.25 MG tablet Take 1 tablet (6.25 mg total) by mouth 2 (two) times daily with a meal.  60 tablet  3  . DULoxetine (CYMBALTA) 60 MG capsule Take 60 mg by mouth daily.       Marland Kitchen  ferrous sulfate 325 (65 FE) MG tablet Take 1 tablet (325 mg total) by mouth 3 (three) times daily with meals.    3  . hydrochlorothiazide (MICROZIDE) 12.5 MG capsule Take 1 capsule (12.5 mg total) by mouth daily.  30 capsule  0  . insulin aspart (NOVOLOG) 100 UNIT/ML injection Inject 2-20 Units into the skin 3 (three) times daily before meals. Sliding scale      . insulin glargine (LANTUS) 100 UNIT/ML injection Inject 0.4 mLs (40 Units total) into the skin 2 (two) times daily.  10 mL  12  . levETIRAcetam (KEPPRA) 1000 MG tablet Take 1,000 mg by mouth at bedtime.       Marland Kitchen MAGNESIUM PO Take 1 tablet by mouth daily.      . polyethylene glycol (MIRALAX / GLYCOLAX) packet Take 17 g by mouth daily.  14 each  0  . vitamin C (ASCORBIC ACID) 500 MG tablet Take 500 mg by mouth daily.      Marland Kitchen  zolpidem (AMBIEN CR) 6.25 MG CR tablet Take 6.25 mg by mouth at bedtime as needed for sleep.      . feeding supplement, GLUCERNA SHAKE, (GLUCERNA SHAKE) LIQD Take 237 mLs by mouth 2 (two) times daily between meals.  60 Can  6  . senna-docusate (SENOKOT-S) 8.6-50 MG per tablet Take 2 tablets by mouth at bedtime.       No current facility-administered medications for this visit.    OBJECTIVE: Middle-aged [place] American female who appears comfortable and is in no acute distress. VITALS: BP: 197/127;  Repeated manually, 186/118 PULSE:  99 RESP:  18 TEMP:  97.7 Body mass index is 29.27 kg/(m^2).  ECOG: 1 Filed Weights   07/20/13 1514  Weight: 170 lb 9.6 oz (77.384 kg)   Physical Exam: HEENT:  Sclerae anicteric. Conjunctiva pink.  Oropharynx clear and moist. Buccal mucosa is pink.  NODES:  No cervical, supraclavicular, or axillary lymphadenopathy palpated.  BREAST EXAM:  Deferred. LUNGS:  Clear to auscultation bilaterally with fair excursion.  No wheezes or rhonchi HEART:  Regular rate and rhythm.  ABDOMEN:  Soft, nontender.  Positive bowel sounds.  MSK:  No focal spinal tenderness to palpation. No CVA tenderness. No joint swelling.  Left foot is bandaged and is in a walking boot. EXTREMITIES:  No peripheral edema.   SKIN:  Benign with no obvious rashes, skin changes, petechiae, or ecchymoses. NEURO:  Nonfocal. Well oriented.  Appropriate affect.    LAB RESULTS:    Lab Results  Component Value Date   WBC 5.0 07/20/2013   NEUTROABS 2.3 07/20/2013   HGB 9.3* 07/20/2013   HCT 28.8* 07/20/2013   MCV 90.5 07/20/2013   PLT 271 07/20/2013      Chemistry      Component Value Date/Time   NA 131* 06/24/2013 0413   K 5.2* 06/24/2013 0413   CL 103 06/24/2013 0413   CO2 21 06/24/2013 0413   BUN 31* 06/24/2013 0413   CREATININE 1.60* 06/24/2013 0413      Component Value Date/Time   CALCIUM 7.8* 06/24/2013 0413   ALKPHOS 109 06/20/2013 1320   AST 22 06/20/2013 1320   ALT 15  06/20/2013 1320   BILITOT 0.2* 06/20/2013 1320       STUDIES: No results found.  ASSESSMENT: 47 y.o.  Jehovah's [name] woman from [place]:  (1)  anemia secondary to GI bleeding, iron deficiency, inflammation, and renal insufficiency. Status post 2 injections of Aranesp and 2 infusions of iron during hospitalization in  November 2014 subsequent improvement in hemoglobin.  (2)  multiple comorbidities including hepatitis C, hypertension, insulin-dependent diabetes mellitus, and stage III chronic kidney disease.  PLAN: Overall, Etoya is showing improvement, and is feeling much better. Her hemoglobin has improved significantly since her discharge from the hospital in late November. Dr. Jana Hakim reviewed Romy's labs today, and per his orders, we will repeat her labs in 1 month, and he will see her for followup in 2 months with labs repeated one week before that visit. (Labs will include CBC, reticulocyte count, and ferritin.)  At that time She wil they will discuss whether or not to resume Aranesp injections or follow her with observation.  In the meanwhile, she will continue on oral iron which she is tolerating well. Racquelle knows to contact us sooner with any abnormal bleeding, increased fatigue, or increased shortness of breath.  With regards to her hypertension today, as noted above, she is scheduled to meet with her primary care physician tomorrow. She will keep that appointment as it stands, but knows to report to the emergency room with any severe headaches or chest pain.  Martisha voices understanding and agreement with the above plan, and will call any changes or problems prior to her next appointment.  Erial Fikes, PA-C   07/20/2013 3:50 PM

## 2013-07-20 NOTE — Telephone Encounter (Signed)
gv and printed appt sched and avs for pt for Jan and Feb 2015

## 2013-08-17 ENCOUNTER — Other Ambulatory Visit: Payer: Medicare Other

## 2013-09-21 ENCOUNTER — Other Ambulatory Visit: Payer: Medicare Other

## 2013-09-28 ENCOUNTER — Ambulatory Visit: Payer: Medicare Other | Admitting: Oncology

## 2013-11-08 ENCOUNTER — Ambulatory Visit (INDEPENDENT_AMBULATORY_CARE_PROVIDER_SITE_OTHER): Payer: Medicare Other | Admitting: General Surgery

## 2013-11-11 ENCOUNTER — Encounter (INDEPENDENT_AMBULATORY_CARE_PROVIDER_SITE_OTHER): Payer: Self-pay | Admitting: General Surgery

## 2013-12-12 ENCOUNTER — Emergency Department (HOSPITAL_COMMUNITY)
Admission: EM | Admit: 2013-12-12 | Discharge: 2013-12-13 | Disposition: A | Discharge status: Home / Self Care | Payer: Medicare Other | Attending: Emergency Medicine | Admitting: Emergency Medicine

## 2013-12-12 ENCOUNTER — Encounter (HOSPITAL_COMMUNITY): Payer: Self-pay | Admitting: Emergency Medicine

## 2013-12-12 DIAGNOSIS — M79672 Pain in left foot: Secondary | ICD-10-CM

## 2013-12-12 DIAGNOSIS — I509 Heart failure, unspecified: Secondary | ICD-10-CM | POA: Insufficient documentation

## 2013-12-12 DIAGNOSIS — E1142 Type 2 diabetes mellitus with diabetic polyneuropathy: Secondary | ICD-10-CM | POA: Insufficient documentation

## 2013-12-12 DIAGNOSIS — Z79899 Other long term (current) drug therapy: Secondary | ICD-10-CM | POA: Insufficient documentation

## 2013-12-12 DIAGNOSIS — H409 Unspecified glaucoma: Secondary | ICD-10-CM | POA: Insufficient documentation

## 2013-12-12 DIAGNOSIS — M79609 Pain in unspecified limb: Secondary | ICD-10-CM | POA: Insufficient documentation

## 2013-12-12 DIAGNOSIS — Z8673 Personal history of transient ischemic attack (TIA), and cerebral infarction without residual deficits: Secondary | ICD-10-CM | POA: Insufficient documentation

## 2013-12-12 DIAGNOSIS — F319 Bipolar disorder, unspecified: Secondary | ICD-10-CM | POA: Insufficient documentation

## 2013-12-12 DIAGNOSIS — N189 Chronic kidney disease, unspecified: Secondary | ICD-10-CM | POA: Insufficient documentation

## 2013-12-12 DIAGNOSIS — Z8619 Personal history of other infectious and parasitic diseases: Secondary | ICD-10-CM | POA: Insufficient documentation

## 2013-12-12 DIAGNOSIS — E1149 Type 2 diabetes mellitus with other diabetic neurological complication: Secondary | ICD-10-CM | POA: Insufficient documentation

## 2013-12-12 DIAGNOSIS — Z880 Allergy status to penicillin: Secondary | ICD-10-CM | POA: Insufficient documentation

## 2013-12-12 DIAGNOSIS — G8918 Other acute postprocedural pain: Secondary | ICD-10-CM | POA: Insufficient documentation

## 2013-12-12 DIAGNOSIS — Z87891 Personal history of nicotine dependence: Secondary | ICD-10-CM | POA: Insufficient documentation

## 2013-12-12 DIAGNOSIS — S98919A Complete traumatic amputation of unspecified foot, level unspecified, initial encounter: Secondary | ICD-10-CM | POA: Insufficient documentation

## 2013-12-12 DIAGNOSIS — I129 Hypertensive chronic kidney disease with stage 1 through stage 4 chronic kidney disease, or unspecified chronic kidney disease: Secondary | ICD-10-CM | POA: Insufficient documentation

## 2013-12-12 DIAGNOSIS — Z794 Long term (current) use of insulin: Secondary | ICD-10-CM | POA: Insufficient documentation

## 2013-12-12 LAB — CBG MONITORING, ED: Glucose-Capillary: 318 mg/dL — ABNORMAL HIGH (ref 70–99)

## 2013-12-12 NOTE — ED Notes (Signed)
The pt thinks  She had an infection in her  Lt foot she already  Had partially amputated foot ??  Length ot time.  Pt eating fritos

## 2013-12-12 NOTE — ED Notes (Signed)
CBG 318 

## 2013-12-12 NOTE — ED Notes (Signed)
C/o bleeding to L foot (surgical site) and blisters to L heal x 2-3 days.  Pt states she had partial L foot amputation approx 4 months ago.  She is living at [facility] and has to leave during the day because she does not have a doctor's note.  States she knows she has been walking on foot too much.  Ace bandage in place.  Reports blood sugar over 300.

## 2013-12-13 MED ORDER — TRAMADOL HCL 50 MG PO TABS: 50.0000 mg | ORAL_TABLET | Freq: Four times a day (QID) | ORAL | Status: DC | PRN

## 2013-12-13 NOTE — ED Provider Notes (Addendum)
CSN: [id number]     Arrival date & time 12/12/13  2214 History   First MD Initiated Contact with Patient 12/13/13 0011     Chief Complaint  Patient presents with  . Wound Check     (Consider location/radiation/quality/duration/timing/severity/associated sxs/prior Treatment) Patient is a 48 y.o. female presenting with wound check. The history is provided by the patient.  Wound Check  She is status post partial amputation of her left foot. She states that she lives at the [facility] she has to leave everyday to go hunting for a job in. Therefore, she has been bearing weight and she thinks she is not supposed to bear weight. She has noted some holes in her foot around the incision and she is concerned that she might need to be on antibiotics. She denies fever or chills.  Past Medical History  Diagnosis Date  . Neuropathy   . Glaucoma   . Bipolar 1 disorder   . CHF (congestive heart failure)   . Refusal of blood transfusions as patient is Jehovah's Witness   . Hypertension   . Shortness of breath   . Diabetes mellitus     INSULIN DEPENDENT  . TIA (transient ischemic attack)   . Neuromuscular disorder     DIABETIC NEUROPATHY  . Chronic kidney disease   . Hepatitis C virus    Past Surgical History  Procedure Laterality Date  . Abdominal hysterectomy    . Amputation Left 05/21/2013    Procedure: Left Midfoot AMPUTATION;  Surgeon: Newt Minion, MD;  Location: [facility];  Service: Orthopedics;  Laterality: Left;  Left Midfoot Amputation  . Tubal ligation    . Colonoscopy with propofol N/A 06/22/2013    Procedure: COLONOSCOPY WITH PROPOFOL;  Surgeon: Jeryl Columbia, MD;  [facility];  Service: Endoscopy;  Laterality: N/A;   No family history on file. History  Substance Use Topics  . Smoking status: Former Smoker -- 30 years    Quit date: 05/04/2013  . Smokeless tobacco: Never Used  . Alcohol Use: No   OB History   Grav Para Term Preterm Abortions TAB SAB Ect Mult  Living                 Review of Systems  All other systems reviewed and are negative.     Allergies  Gabapentin and Penicillins  Home Medications   Prior to Admission medications   Medication Sig Start Date End Date Taking? Authorizing Provider  asenapine (SAPHRIS) 5 MG SUBL 24 hr tablet Place 5 mg under the tongue at bedtime.   Yes Historical Provider, MD  carvedilol (COREG) 6.25 MG tablet Take 1 tablet (6.25 mg total) by mouth 2 (two) times daily with a meal. 05/24/13  Yes Bobby Rumpf York, PA-C  DULoxetine (CYMBALTA) 60 MG capsule Take 60 mg by mouth daily.    Yes Historical Provider, MD  insulin aspart (NOVOLOG) 100 UNIT/ML injection Inject 2-20 Units into the skin 3 (three) times daily before meals. Sliding scale   Yes Historical Provider, MD  insulin glargine (LANTUS) 100 UNIT/ML injection Inject 60 Units into the skin at bedtime.   Yes Historical Provider, MD   BP 214/114  Pulse 103  Temp(Src) 98.6 F (37 C) (Oral)  Resp 18  Ht 5\' 4"  (1.626 m)  Wt 173 lb (78.472 kg)  BMI 29.68 kg/m2  SpO2 100% Physical Exam  Nursing note and vitals reviewed.  48 year old female, resting comfortably and in no acute distress. Vital  signs are significant for hypertension with blood pressure 214/114, and tachycardia with heart rate of 103. Oxygen saturation is 100%, which is normal. Head is normocephalic and atraumatic. PERRLA, EOMI. Oropharynx is clear. Neck is nontender and supple without adenopathy or JVD. Back is nontender and there is no CVA tenderness. Lungs are clear without rales, wheezes, or rhonchi. Chest is nontender. Heart has regular rate and rhythm without murmur. Abdomen is soft, flat, nontender without masses or hepatosplenomegaly and peristalsis is normoactive. Extremities: She is status post left midfoot amputation. There apparently had been some dehiscent of the wound but this is granulating in well and there is no evidence of erythema or warmth or drainage. There is  also a an ulceration over the posterior aspect of the left heel which is also granulated well and indurated without erythema or drainage. No evidence of infection.. Skin is warm and dry without rash. Neurologic: Mental status is normal, cranial nerves are intact, there are no motor or sensory deficits.  ED Course  Procedures (including critical care time) Labs Review Labs Reviewed  CBG MONITORING, ED - Abnormal; Notable for the following:    Glucose-Capillary 318 (*)    All other components within normal limits   MDM   Final diagnoses:  Pain in left foot    Lleft midfoot amputation which appears to be healing appropriately without evidence of infection. Hypertension is noted on blood pressure will be repeated. Blood sugar is somewhat elevated and she will need to discuss with her PCP had to adjust her insulin dose. She is referred back to her orthopedic physician to discuss how much weight bearing she may safely do. She is requesting a postop shoe which is given and she is given a set of crutches. She states that she has crutches but they're at her sister's house and she had a falling out with her sister.    [name], MD [phone]  Repeat blood pressure was still considerably elevated and patient is advised that she will need to followup with PCP. As she was being discharged, and she insisted that she needed a prescription for Percocet. I reviewed her record on [place] controlled substance reporting website and she has not had a Percocet prescription filled since January 20. She's given a prescription for tramadol and is advised that she would need to discuss with her orthopedic surgeon and/or PCP whether she should continue to receive narcotic prescriptions.  [name], MD [id number] [phone]

## 2013-12-13 NOTE — Discharge Instructions (Signed)
Use the crutches and post-op shoe as needed. Continue routine wound care. Maek an appointment to see Dr. Sharol Given  - ask him how much weight bearing you are allowed to do.

## 2013-12-13 NOTE — Progress Notes (Signed)
Orthopedic Tech Progress Note Patient Details:  [name] 05-12-1966 [id number]  Ortho Devices Type of Ortho Device: Prentice 12/13/2013, 12:37 AM

## 2014-01-31 ENCOUNTER — Encounter (HOSPITAL_COMMUNITY): Payer: Self-pay | Admitting: Emergency Medicine

## 2014-01-31 ENCOUNTER — Emergency Department (HOSPITAL_COMMUNITY)
Admission: EM | Admit: 2014-01-31 | Discharge: 2014-02-01 | Disposition: A | Discharge status: Home / Self Care | Payer: Medicare Other | Source: Home / Self Care | Attending: Emergency Medicine | Admitting: Emergency Medicine

## 2014-01-31 DIAGNOSIS — I509 Heart failure, unspecified: Secondary | ICD-10-CM | POA: Diagnosis present

## 2014-01-31 DIAGNOSIS — Z794 Long term (current) use of insulin: Secondary | ICD-10-CM

## 2014-01-31 DIAGNOSIS — Z885 Allergy status to narcotic agent status: Secondary | ICD-10-CM

## 2014-01-31 DIAGNOSIS — Z9119 Patient's noncompliance with other medical treatment and regimen: Secondary | ICD-10-CM

## 2014-01-31 DIAGNOSIS — Z880 Allergy status to penicillin: Secondary | ICD-10-CM

## 2014-01-31 DIAGNOSIS — Z9889 Other specified postprocedural states: Secondary | ICD-10-CM

## 2014-01-31 DIAGNOSIS — F319 Bipolar disorder, unspecified: Secondary | ICD-10-CM | POA: Diagnosis present

## 2014-01-31 DIAGNOSIS — R21 Rash and other nonspecific skin eruption: Secondary | ICD-10-CM

## 2014-01-31 DIAGNOSIS — F259 Schizoaffective disorder, unspecified: Secondary | ICD-10-CM | POA: Diagnosis present

## 2014-01-31 DIAGNOSIS — G40909 Epilepsy, unspecified, not intractable, without status epilepticus: Secondary | ICD-10-CM | POA: Insufficient documentation

## 2014-01-31 DIAGNOSIS — N039 Chronic nephritic syndrome with unspecified morphologic changes: Secondary | ICD-10-CM

## 2014-01-31 DIAGNOSIS — E119 Type 2 diabetes mellitus without complications: Secondary | ICD-10-CM

## 2014-01-31 DIAGNOSIS — E1149 Type 2 diabetes mellitus with other diabetic neurological complication: Secondary | ICD-10-CM | POA: Diagnosis present

## 2014-01-31 DIAGNOSIS — Z8619 Personal history of other infectious and parasitic diseases: Secondary | ICD-10-CM | POA: Insufficient documentation

## 2014-01-31 DIAGNOSIS — F17200 Nicotine dependence, unspecified, uncomplicated: Secondary | ICD-10-CM

## 2014-01-31 DIAGNOSIS — N189 Chronic kidney disease, unspecified: Secondary | ICD-10-CM

## 2014-01-31 DIAGNOSIS — D509 Iron deficiency anemia, unspecified: Secondary | ICD-10-CM | POA: Diagnosis present

## 2014-01-31 DIAGNOSIS — H409 Unspecified glaucoma: Secondary | ICD-10-CM | POA: Diagnosis present

## 2014-01-31 DIAGNOSIS — R3989 Other symptoms and signs involving the genitourinary system: Secondary | ICD-10-CM | POA: Diagnosis present

## 2014-01-31 DIAGNOSIS — I129 Hypertensive chronic kidney disease with stage 1 through stage 4 chronic kidney disease, or unspecified chronic kidney disease: Secondary | ICD-10-CM | POA: Insufficient documentation

## 2014-01-31 DIAGNOSIS — H5316 Psychophysical visual disturbances: Secondary | ICD-10-CM | POA: Diagnosis present

## 2014-01-31 DIAGNOSIS — Z8673 Personal history of transient ischemic attack (TIA), and cerebral infarction without residual deficits: Secondary | ICD-10-CM | POA: Insufficient documentation

## 2014-01-31 DIAGNOSIS — Z8249 Family history of ischemic heart disease and other diseases of the circulatory system: Secondary | ICD-10-CM

## 2014-01-31 DIAGNOSIS — Z79899 Other long term (current) drug therapy: Secondary | ICD-10-CM | POA: Insufficient documentation

## 2014-01-31 DIAGNOSIS — Z888 Allergy status to other drugs, medicaments and biological substances status: Secondary | ICD-10-CM

## 2014-01-31 DIAGNOSIS — Z8614 Personal history of Methicillin resistant Staphylococcus aureus infection: Secondary | ICD-10-CM

## 2014-01-31 DIAGNOSIS — E1100 Type 2 diabetes mellitus with hyperosmolarity without nonketotic hyperglycemic-hyperosmolar coma (NKHHC): Secondary | ICD-10-CM | POA: Diagnosis present

## 2014-01-31 DIAGNOSIS — N179 Acute kidney failure, unspecified: Secondary | ICD-10-CM | POA: Diagnosis present

## 2014-01-31 DIAGNOSIS — N183 Chronic kidney disease, stage 3 unspecified: Secondary | ICD-10-CM | POA: Diagnosis present

## 2014-01-31 DIAGNOSIS — Z91199 Patient's noncompliance with other medical treatment and regimen due to unspecified reason: Secondary | ICD-10-CM

## 2014-01-31 DIAGNOSIS — R739 Hyperglycemia, unspecified: Secondary | ICD-10-CM

## 2014-01-31 DIAGNOSIS — D631 Anemia in chronic kidney disease: Secondary | ICD-10-CM | POA: Diagnosis present

## 2014-01-31 DIAGNOSIS — R609 Edema, unspecified: Secondary | ICD-10-CM | POA: Diagnosis not present

## 2014-01-31 DIAGNOSIS — H532 Diplopia: Secondary | ICD-10-CM | POA: Diagnosis present

## 2014-01-31 DIAGNOSIS — E1310 Other specified diabetes mellitus with ketoacidosis without coma: Secondary | ICD-10-CM | POA: Diagnosis present

## 2014-01-31 DIAGNOSIS — R42 Dizziness and giddiness: Secondary | ICD-10-CM | POA: Diagnosis not present

## 2014-01-31 DIAGNOSIS — I10 Essential (primary) hypertension: Secondary | ICD-10-CM

## 2014-01-31 DIAGNOSIS — R569 Unspecified convulsions: Secondary | ICD-10-CM | POA: Diagnosis not present

## 2014-01-31 DIAGNOSIS — E1142 Type 2 diabetes mellitus with diabetic polyneuropathy: Secondary | ICD-10-CM | POA: Diagnosis present

## 2014-01-31 LAB — CBC
HCT: 34.7 % — ABNORMAL LOW (ref 36.0–46.0)
Hemoglobin: 12.1 g/dL (ref 12.0–15.0)
MCH: 28.3 pg (ref 26.0–34.0)
MCHC: 34.9 g/dL (ref 30.0–36.0)
MCV: 81.1 fL (ref 78.0–100.0)
PLATELETS: 356 10*3/uL (ref 150–400)
RBC: 4.28 MIL/uL (ref 3.87–5.11)
RDW: 13.9 % (ref 11.5–15.5)
WBC: 6.2 10*3/uL (ref 4.0–10.5)

## 2014-01-31 LAB — BASIC METABOLIC PANEL
BUN: 22 mg/dL (ref 6–23)
CALCIUM: 8.5 mg/dL (ref 8.4–10.5)
CO2: 23 meq/L (ref 19–32)
CREATININE: 1.97 mg/dL — AB (ref 0.50–1.10)
Chloride: 95 mEq/L — ABNORMAL LOW (ref 96–112)
GFR calc Af Amer: 34 mL/min — ABNORMAL LOW (ref >90)
GFR calc non Af Amer: 29 mL/min — ABNORMAL LOW (ref >90)
GLUCOSE: 508 mg/dL — AB (ref 70–99)
Potassium: 4 mEq/L (ref 3.7–5.3)
SODIUM: 131 meq/L — AB (ref 137–147)

## 2014-01-31 MED ORDER — MORPHINE SULFATE 4 MG/ML IJ SOLN
4.0000 mg | Freq: Once | INTRAMUSCULAR | Status: AC
  Administered 2014-02-01: 4 mg via INTRAVENOUS
  Filled 2014-01-31: qty 1

## 2014-01-31 MED ORDER — CLINDAMYCIN PHOSPHATE 600 MG/50ML IV SOLN
600.0000 mg | Freq: Once | INTRAVENOUS | Status: AC
  Administered 2014-02-01: 600 mg via INTRAVENOUS
  Filled 2014-01-31: qty 50

## 2014-01-31 MED ORDER — SODIUM CHLORIDE 0.9 % IV BOLUS (SEPSIS)
1000.0000 mL | Freq: Once | INTRAVENOUS | Status: AC
  Administered 2014-02-01: 1000 mL via INTRAVENOUS

## 2014-01-31 NOTE — ED Notes (Signed)
Bed: [facility] Expected date:  Expected time:  Means of arrival:  Comments: EMS 

## 2014-01-31 NOTE — ED Notes (Addendum)
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 Patient arrived via [facility] and is ambulatory. Patient states she had 2 seizures last night, c/o headache. States headache has been lasting @1  week. Patient was seen last week by her home health aide who told the patient she needed to come to the ER to have her blood pressure evaluated as it was "high", patient did not come in at that time as she "just took two pills, and I felt better". Patient has paperwork from [place] r/t her prior medical events r/t MRSA. Patient states she is supposed to be taking Kepra for seizures but has been out for @ 1 week. Patient also states she would like to have her left foot evaluated at the amputation site.

## 2014-01-31 NOTE — ED Notes (Signed)
Per EMS, patient had seizure x2 last night. Patient reports she was unable to get up after her seizure, when family arrived they were able to help her up she began complaining of headache 10/10. Patient unaware if she hit her head on anything, no obvious injuries. Patient c/o multiple MRSA sores, and also reports she "never had seizures until they drilled a hole in my head to remove the MRSA". Patient ambulatory on arrival to ER.

## 2014-01-31 NOTE — ED Provider Notes (Signed)
CSN: [id number]     Arrival date & time 01/31/14  2004 History   First MD Initiated Contact with Patient 01/31/14 2228     Chief Complaint  Patient presents with  . Headache    s/p seizure x2 last night  . Wound Check    multiple MRSA sites     (Consider location/radiation/quality/duration/timing/severity/associated sxs/prior Treatment) HPI  48 year old female with history of bipolar, insulin-dependent diabetes, diabetic neuropathy, and a reported history of seizure who presents with multiple complaints. Began having intermittent headaches 1 week ago. Notes "I knew I was going to begin having seizures because my headaches were getting worse". Had 1 "grand mal" seizure last night, when she tried to stand, she had second seizure. Seizure was unwitnessed.  She notes hitting her head during second seizure and has worsened headache ever since, she felt too weak to move from the floor so she continued to lay on floor until a few hours ago when she decided to call EMS. Reports many "MRSA sores" all over her body and are infecting her blood and causing her to have seizures. Her sores ongoing for 1 1/2 year since she was first developed it in [place].  Sts she had one on her scalp that got severe infected which she was hospitalized for 1 week and that's when her seizure began. She states "I need IV antibiotics in order to get this infection out of my blood, if I don't get the infection out of my blood I'm going to have more seizures, I feel like I could have one at any moment".   Past Medical History  Diagnosis Date  . Neuropathy   . Glaucoma   . Bipolar 1 disorder   . CHF (congestive heart failure)   . Refusal of blood transfusions as patient is Jehovah's Witness   . Hypertension   . Shortness of breath   . Diabetes mellitus     INSULIN DEPENDENT  . TIA (transient ischemic attack)   . Neuromuscular disorder     DIABETIC NEUROPATHY  . Chronic kidney disease   . Hepatitis C virus    Past  Surgical History  Procedure Laterality Date  . Abdominal hysterectomy    . Amputation Left 05/21/2013    Procedure: Left Midfoot AMPUTATION;  Surgeon: Newt Minion, MD;  Location: [facility];  Service: Orthopedics;  Laterality: Left;  Left Midfoot Amputation  . Tubal ligation    . Colonoscopy with propofol N/A 06/22/2013    Procedure: COLONOSCOPY WITH PROPOFOL;  Surgeon: Jeryl Columbia, MD;  Location: [facility];  Service: Endoscopy;  Laterality: N/A;   No family history on file. History  Substance Use Topics  . Smoking status: Current Every Day Smoker -- 0.25 packs/day for 30 years    Types: Cigarettes  . Smokeless tobacco: Never Used  . Alcohol Use: Yes     Comment: @ 1/week   OB History   Grav Para Term Preterm Abortions TAB SAB Ect Mult Living                 Review of Systems  Constitutional: Negative for fever.  Respiratory: Positive for shortness of breath.   Cardiovascular: Negative for chest pain.  Gastrointestinal: Positive for nausea and vomiting (once, last night). Negative for abdominal pain.  Neurological: Positive for dizziness, seizures, speech difficulty (last night and this morning), light-headedness and headaches.      Allergies  Gabapentin; Tramadol; and Penicillins  Home Medications   Prior to Admission medications  Medication Sig Start Date End Date Taking? Authorizing Janis Sol  acetaminophen (TYLENOL) 500 MG tablet Take 1,000 mg by mouth every 6 (six) hours as needed for mild pain.   Yes Historical Jsoeph Podesta, MD  asenapine (SAPHRIS) 5 MG SUBL 24 hr tablet Place 5 mg under the tongue at bedtime.   Yes Historical Dnasia Gauna, MD  BLACK COHOSH EXTRACT PO Take 2 tablets by mouth daily.   Yes Historical Pollyanna Levay, MD  carvedilol (COREG) 12.5 MG tablet Take 12.5 mg by mouth 2 (two) times daily with a meal.   Yes Historical Nadia Torr, MD  DULoxetine (CYMBALTA) 60 MG capsule Take 60 mg by mouth daily.    Yes Historical Makyah Lavigne, MD  insulin aspart (NOVOLOG) 100  UNIT/ML injection Inject 2-20 Units into the skin 3 (three) times daily before meals. Sliding scale   Yes Historical Yolandra Habig, MD  insulin glargine (LANTUS) 100 UNIT/ML injection Inject 45 Units into the skin at bedtime.    Yes Historical Marrion Accomando, MD  levETIRAcetam (KEPPRA) 500 MG tablet Take 500 mg by mouth 2 (two) times daily.   Yes Historical Maryjean Corpening, MD  Multiple Vitamin (MULTIVITAMIN WITH MINERALS) TABS tablet Take 1 tablet by mouth daily.   Yes Historical Ahnna Dungan, MD  polyethylene glycol (MIRALAX / GLYCOLAX) packet Take 17 g by mouth 2 (two) times daily as needed for mild constipation.   Yes Historical Pranay Hilbun, MD   BP 178/117  Pulse 105  Temp(Src) 97.6 F (36.4 C) (Oral)  Resp 18  SpO2 100% Physical Exam  Constitutional: She is oriented to person, place, and time. She appears well-developed and well-nourished.  HENT:  Head: Normocephalic and atraumatic.  Eyes: Conjunctivae are normal. Pupils are equal, round, and reactive to light.  Cardiovascular: Regular rhythm and normal heart sounds.  Exam reveals no gallop and no friction rub.   No murmur heard. Tachycardic   Pulmonary/Chest: Effort normal and breath sounds normal.  Abdominal: Soft. Bowel sounds are normal. There is no tenderness.  Neurological: She is alert and oriented to person, place, and time.  Skin: Rash (multiple hyperpigmented rash noted throughout body including scalp, forehead, neck, uppcer chest, back, arms and legs.  No warmth or erythema, no abscess.  minimal tenderness to palpation) noted.  L foot with mid stump, no signs of infection.      ED Course  Procedures (including critical care time)  11:03 PM Pt here with multiple complaints, primarily fear of MRSA infection.  Pt does have multiple sores throughout body likely MRSA.  She is also tachycardic.  Plan to give IV abx and IVF.  She report hx of seizure, havn't been on keppra for 1 week.  No seizure today.  Will refill her Keppra.  Pt otherwise  mentating fine, non toxic in appearance.  Normal strength throughout.    12:16 AM Pt with elevated glucose of 508, no anion gap.  Evidence of renal insufficiency with cr. 1.97, higher than her baseline of 1.4.  Pt made aware and recommend to have her renal function recheck.    12:51 AM Pt made aware that her CBG is usually in the 300s.  Her current CBG is now in the 300s.  Pt has elevated BP but did not take her usual BP medication. Her meds were given here.  She will need to f/u with her PCP for further management of her condition.  Care discussed with oncoming Khameron Gruenwald who will d/c pt pending normalization of her BP and improvement of her CBG.  Her skin rash will be treated  with clindamycin, dermatology referral given as well, no obvious active infection noted.    Labs Review Labs Reviewed  BASIC METABOLIC PANEL - Abnormal; Notable for the following:    Sodium 131 (*)    Chloride 95 (*)    Glucose, Bld 508 (*)    Creatinine, Ser 1.97 (*)    GFR calc non Af Amer 29 (*)    GFR calc Af Amer 34 (*)    All other components within normal limits  CBC - Abnormal; Notable for the following:    HCT 34.7 (*)    All other components within normal limits  URINALYSIS, ROUTINE W REFLEX MICROSCOPIC - Abnormal; Notable for the following:    APPearance CLOUDY (*)    Glucose, UA >1000 (*)    Hgb urine dipstick MODERATE (*)    Ketones, ur 15 (*)    Protein, ur >300 (*)    All other components within normal limits  URINE MICROSCOPIC-ADD ON - Abnormal; Notable for the following:    Squamous Epithelial / LPF FEW (*)    Bacteria, UA FEW (*)    Casts HYALINE CASTS (*)    All other components within normal limits  CBG MONITORING, ED - Abnormal; Notable for the following:    Glucose-Capillary 371 (*)    All other components within normal limits  URINE RAPID DRUG SCREEN (HOSP PERFORMED)    Imaging Review No results found.   EKG Interpretation None      MDM   Final diagnoses:  Rash  Seizure  disorder  Hyperglycemia  Essential hypertension    BP 170/98  Pulse 83  Temp(Src) 97.8 F (36.6 C) (Oral)  Resp 18  SpO2 99%     Domenic Moras, PA-C 02/01/14 1504

## 2014-02-01 ENCOUNTER — Encounter (HOSPITAL_COMMUNITY): Payer: Self-pay | Admitting: Emergency Medicine

## 2014-02-01 ENCOUNTER — Inpatient Hospital Stay (HOSPITAL_COMMUNITY)
Admission: EM | Admit: 2014-02-01 | Discharge: 2014-02-10 | DRG: 100 | Disposition: A | Discharge status: Skilled Nursing Facility | Payer: Medicare Other | Attending: Internal Medicine | Admitting: Internal Medicine

## 2014-02-01 DIAGNOSIS — E1159 Type 2 diabetes mellitus with other circulatory complications: Secondary | ICD-10-CM

## 2014-02-01 DIAGNOSIS — D649 Anemia, unspecified: Secondary | ICD-10-CM

## 2014-02-01 DIAGNOSIS — L02619 Cutaneous abscess of unspecified foot: Secondary | ICD-10-CM

## 2014-02-01 DIAGNOSIS — G40909 Epilepsy, unspecified, not intractable, without status epilepticus: Secondary | ICD-10-CM

## 2014-02-01 DIAGNOSIS — F259 Schizoaffective disorder, unspecified: Secondary | ICD-10-CM

## 2014-02-01 DIAGNOSIS — N390 Urinary tract infection, site not specified: Secondary | ICD-10-CM

## 2014-02-01 DIAGNOSIS — Z9119 Patient's noncompliance with other medical treatment and regimen: Secondary | ICD-10-CM

## 2014-02-01 DIAGNOSIS — L03119 Cellulitis of unspecified part of limb: Secondary | ICD-10-CM

## 2014-02-01 DIAGNOSIS — N189 Chronic kidney disease, unspecified: Secondary | ICD-10-CM

## 2014-02-01 DIAGNOSIS — N183 Chronic kidney disease, stage 3 unspecified: Secondary | ICD-10-CM

## 2014-02-01 DIAGNOSIS — E1110 Type 2 diabetes mellitus with ketoacidosis without coma: Secondary | ICD-10-CM | POA: Diagnosis present

## 2014-02-01 DIAGNOSIS — I951 Orthostatic hypotension: Secondary | ICD-10-CM

## 2014-02-01 DIAGNOSIS — Z91199 Patient's noncompliance with other medical treatment and regimen due to unspecified reason: Secondary | ICD-10-CM

## 2014-02-01 DIAGNOSIS — E1165 Type 2 diabetes mellitus with hyperglycemia: Secondary | ICD-10-CM

## 2014-02-01 DIAGNOSIS — R569 Unspecified convulsions: Secondary | ICD-10-CM

## 2014-02-01 DIAGNOSIS — N179 Acute kidney failure, unspecified: Secondary | ICD-10-CM

## 2014-02-01 DIAGNOSIS — E860 Dehydration: Secondary | ICD-10-CM

## 2014-02-01 DIAGNOSIS — R739 Hyperglycemia, unspecified: Secondary | ICD-10-CM

## 2014-02-01 DIAGNOSIS — F319 Bipolar disorder, unspecified: Secondary | ICD-10-CM

## 2014-02-01 DIAGNOSIS — I10 Essential (primary) hypertension: Secondary | ICD-10-CM

## 2014-02-01 DIAGNOSIS — I161 Hypertensive emergency: Secondary | ICD-10-CM

## 2014-02-01 DIAGNOSIS — IMO0002 Reserved for concepts with insufficient information to code with codable children: Secondary | ICD-10-CM

## 2014-02-01 DIAGNOSIS — E1100 Type 2 diabetes mellitus with hyperosmolarity without nonketotic hyperglycemic-hyperosmolar coma (NKHHC): Secondary | ICD-10-CM

## 2014-02-01 HISTORY — DX: Unspecified convulsions: R56.9

## 2014-02-01 HISTORY — DX: Bipolar disorder, unspecified: F31.9

## 2014-02-01 HISTORY — DX: Methicillin resistant Staphylococcus aureus infection, unspecified site: A49.02

## 2014-02-01 HISTORY — DX: Schizoaffective disorder, unspecified: F25.9

## 2014-02-01 LAB — URINALYSIS, ROUTINE W REFLEX MICROSCOPIC
BILIRUBIN URINE: NEGATIVE
Glucose, UA: >1000 mg/dL — AB
Ketones, ur: 15 mg/dL — AB
Leukocytes, UA: NEGATIVE
Nitrite: NEGATIVE
Protein, ur: >300 mg/dL — AB
SPECIFIC GRAVITY, URINE: 1.024 (ref 1.005–1.030)
UROBILINOGEN UA: 0.2 mg/dL (ref 0.0–1.0)
pH: 6.5 (ref 5.0–8.0)

## 2014-02-01 LAB — URINE MICROSCOPIC-ADD ON

## 2014-02-01 LAB — RAPID URINE DRUG SCREEN, HOSP PERFORMED
Amphetamines: NOT DETECTED
Barbiturates: NOT DETECTED
Benzodiazepines: NOT DETECTED
Cocaine: NOT DETECTED
Opiates: NOT DETECTED
TETRAHYDROCANNABINOL: NOT DETECTED

## 2014-02-01 LAB — CBG MONITORING, ED: GLUCOSE-CAPILLARY: 371 mg/dL — AB (ref 70–99)

## 2014-02-01 MED ORDER — SODIUM CHLORIDE 0.9 % IV SOLN: 1000.0000 mL | INTRAVENOUS | Status: DC

## 2014-02-01 MED ORDER — LEVETIRACETAM 500 MG PO TABS: 500.0000 mg | ORAL_TABLET | Freq: Two times a day (BID) | ORAL | Status: DC

## 2014-02-01 MED ORDER — CLONIDINE HCL 0.1 MG PO TABS
0.1000 mg | ORAL_TABLET | Freq: Once | ORAL | Status: AC
  Administered 2014-02-01: 0.1 mg via ORAL
  Filled 2014-02-01: qty 1

## 2014-02-01 MED ORDER — INSULIN ASPART 100 UNIT/ML IV SOLN
10.0000 [IU] | Freq: Once | INTRAVENOUS | Status: AC
  Administered 2014-02-02: 10 [IU] via INTRAVENOUS
  Filled 2014-02-01: qty 0.1

## 2014-02-01 MED ORDER — SODIUM CHLORIDE 0.9 % IV BOLUS (SEPSIS)
500.0000 mL | Freq: Once | INTRAVENOUS | Status: AC
  Administered 2014-02-02: 500 mL via INTRAVENOUS

## 2014-02-01 MED ORDER — CARVEDILOL 12.5 MG PO TABS
12.5000 mg | ORAL_TABLET | ORAL | Status: AC
  Administered 2014-02-01: 12.5 mg via ORAL
  Filled 2014-02-01: qty 1

## 2014-02-01 MED ORDER — DEXTROSE-NACL 5-0.45 % IV SOLN: INTRAVENOUS | Status: DC

## 2014-02-01 MED ORDER — SODIUM CHLORIDE 0.9 % IV SOLN: 1000.0000 mL | Freq: Once | INTRAVENOUS | Status: DC

## 2014-02-01 MED ORDER — METOPROLOL TARTRATE 1 MG/ML IV SOLN
5.0000 mg | Freq: Once | INTRAVENOUS | Status: AC
Start: 2014-02-01 — End: 2014-02-01
  Administered 2014-02-01: 5 mg via INTRAVENOUS
  Filled 2014-02-01: qty 5

## 2014-02-01 MED ORDER — SODIUM CHLORIDE 0.9 % IV SOLN
INTRAVENOUS | Status: DC
  Filled 2014-02-01: qty 1

## 2014-02-01 MED ORDER — LORAZEPAM 2 MG/ML IJ SOLN
INTRAMUSCULAR | Status: AC
  Administered 2014-02-01: 0.5 mg via INTRAVENOUS
  Filled 2014-02-01: qty 1

## 2014-02-01 MED ORDER — LABETALOL HCL 5 MG/ML IV SOLN
10.0000 mg | Freq: Once | INTRAVENOUS | Status: AC
  Administered 2014-02-02: 10 mg via INTRAVENOUS
  Filled 2014-02-01: qty 4

## 2014-02-01 MED ORDER — LORAZEPAM 2 MG/ML IJ SOLN
0.5000 mg | Freq: Once | INTRAMUSCULAR | Status: AC
  Administered 2014-02-01: 0.5 mg via INTRAVENOUS

## 2014-02-01 MED ORDER — CLINDAMYCIN HCL 150 MG PO CAPS: 150.0000 mg | ORAL_CAPSULE | Freq: Four times a day (QID) | ORAL | Status: DC

## 2014-02-01 MED ORDER — INSULIN ASPART 100 UNIT/ML ~~LOC~~ SOLN
5.0000 [IU] | Freq: Once | SUBCUTANEOUS | Status: AC
  Administered 2014-02-01: 5 [IU] via INTRAVENOUS
  Filled 2014-02-01: qty 1

## 2014-02-01 NOTE — Discharge Instructions (Signed)
You have been evaluated for your rash. Please follow up with dermatology office for further care.  Take antibiotic as prescribed.  Your blood sugar and blood pressure is high today, take your medications regularly.  Also take your seizure medication and follow up with your doctor for further management of your health.

## 2014-02-01 NOTE — ED Notes (Signed)
Pt called EMS for seizure and upon arrival found that she had hyperglycemia >600. Daughter states that seizure was full body for 3 minutes. Post ictal on arrival.

## 2014-02-01 NOTE — ED Notes (Signed)
Bed: [id number] Expected date:  Expected time:  Means of arrival:  Comments: EMS seizure - seen yesterday for same

## 2014-02-01 NOTE — ED Provider Notes (Signed)
[name] 1:00 AM patient discussed and signed. Patient with elevated blood pressure. No other concerning symptoms related to blood pressure. Treatment has been given we'll plan to reassess and if blood pressure better patient to be discharged with planned treatment for her primary complaints.  2:20 AM blood pressure is much better. Patient continues to be without significant symptoms related to the blood pressure. She may be discharged at this time. Strict return precautions given. Patient instructed to followup closely with her doctors and call there office today.  Martie Lee, PA-C 02/01/14 [phone]

## 2014-02-01 NOTE — ED Provider Notes (Signed)
Medical screening examination/treatment/procedure(s) were performed by non-physician practitioner and as supervising physician I was immediately available for consultation/collaboration.   EKG Interpretation None       Kalman Drape, MD 02/01/14 [phone]

## 2014-02-02 ENCOUNTER — Encounter (HOSPITAL_COMMUNITY): Payer: Self-pay | Admitting: Neurology

## 2014-02-02 ENCOUNTER — Emergency Department (HOSPITAL_COMMUNITY): Payer: Medicare Other

## 2014-02-02 ENCOUNTER — Inpatient Hospital Stay (HOSPITAL_COMMUNITY): Payer: Medicare Other

## 2014-02-02 DIAGNOSIS — R3989 Other symptoms and signs involving the genitourinary system: Secondary | ICD-10-CM | POA: Diagnosis present

## 2014-02-02 DIAGNOSIS — Z79899 Other long term (current) drug therapy: Secondary | ICD-10-CM | POA: Diagnosis not present

## 2014-02-02 DIAGNOSIS — E1100 Type 2 diabetes mellitus with hyperosmolarity without nonketotic hyperglycemic-hyperosmolar coma (NKHHC): Secondary | ICD-10-CM | POA: Diagnosis present

## 2014-02-02 DIAGNOSIS — N183 Chronic kidney disease, stage 3 unspecified: Secondary | ICD-10-CM

## 2014-02-02 DIAGNOSIS — E1142 Type 2 diabetes mellitus with diabetic polyneuropathy: Secondary | ICD-10-CM | POA: Diagnosis present

## 2014-02-02 DIAGNOSIS — I509 Heart failure, unspecified: Secondary | ICD-10-CM | POA: Diagnosis present

## 2014-02-02 DIAGNOSIS — G40909 Epilepsy, unspecified, not intractable, without status epilepticus: Secondary | ICD-10-CM | POA: Diagnosis present

## 2014-02-02 DIAGNOSIS — I10 Essential (primary) hypertension: Secondary | ICD-10-CM

## 2014-02-02 DIAGNOSIS — Z91199 Patient's noncompliance with other medical treatment and regimen due to unspecified reason: Secondary | ICD-10-CM

## 2014-02-02 DIAGNOSIS — R609 Edema, unspecified: Secondary | ICD-10-CM | POA: Diagnosis not present

## 2014-02-02 DIAGNOSIS — R569 Unspecified convulsions: Secondary | ICD-10-CM | POA: Diagnosis present

## 2014-02-02 DIAGNOSIS — Z8614 Personal history of Methicillin resistant Staphylococcus aureus infection: Secondary | ICD-10-CM | POA: Diagnosis not present

## 2014-02-02 DIAGNOSIS — H5316 Psychophysical visual disturbances: Secondary | ICD-10-CM | POA: Diagnosis present

## 2014-02-02 DIAGNOSIS — F259 Schizoaffective disorder, unspecified: Secondary | ICD-10-CM | POA: Diagnosis present

## 2014-02-02 DIAGNOSIS — I161 Hypertensive emergency: Secondary | ICD-10-CM | POA: Diagnosis present

## 2014-02-02 DIAGNOSIS — F17200 Nicotine dependence, unspecified, uncomplicated: Secondary | ICD-10-CM | POA: Diagnosis present

## 2014-02-02 DIAGNOSIS — R42 Dizziness and giddiness: Secondary | ICD-10-CM | POA: Diagnosis not present

## 2014-02-02 DIAGNOSIS — F319 Bipolar disorder, unspecified: Secondary | ICD-10-CM | POA: Diagnosis present

## 2014-02-02 DIAGNOSIS — Z9119 Patient's noncompliance with other medical treatment and regimen: Secondary | ICD-10-CM

## 2014-02-02 DIAGNOSIS — Z8249 Family history of ischemic heart disease and other diseases of the circulatory system: Secondary | ICD-10-CM | POA: Diagnosis not present

## 2014-02-02 DIAGNOSIS — Z880 Allergy status to penicillin: Secondary | ICD-10-CM | POA: Diagnosis not present

## 2014-02-02 DIAGNOSIS — Z8673 Personal history of transient ischemic attack (TIA), and cerebral infarction without residual deficits: Secondary | ICD-10-CM | POA: Diagnosis not present

## 2014-02-02 DIAGNOSIS — E1110 Type 2 diabetes mellitus with ketoacidosis without coma: Secondary | ICD-10-CM | POA: Diagnosis present

## 2014-02-02 DIAGNOSIS — D631 Anemia in chronic kidney disease: Secondary | ICD-10-CM | POA: Diagnosis present

## 2014-02-02 DIAGNOSIS — D509 Iron deficiency anemia, unspecified: Secondary | ICD-10-CM | POA: Diagnosis present

## 2014-02-02 DIAGNOSIS — H409 Unspecified glaucoma: Secondary | ICD-10-CM | POA: Diagnosis present

## 2014-02-02 DIAGNOSIS — H532 Diplopia: Secondary | ICD-10-CM | POA: Diagnosis present

## 2014-02-02 DIAGNOSIS — E1149 Type 2 diabetes mellitus with other diabetic neurological complication: Secondary | ICD-10-CM | POA: Diagnosis present

## 2014-02-02 DIAGNOSIS — E1310 Other specified diabetes mellitus with ketoacidosis without coma: Secondary | ICD-10-CM | POA: Diagnosis present

## 2014-02-02 DIAGNOSIS — D649 Anemia, unspecified: Secondary | ICD-10-CM

## 2014-02-02 DIAGNOSIS — N179 Acute kidney failure, unspecified: Secondary | ICD-10-CM

## 2014-02-02 DIAGNOSIS — Z794 Long term (current) use of insulin: Secondary | ICD-10-CM | POA: Diagnosis not present

## 2014-02-02 DIAGNOSIS — Z888 Allergy status to other drugs, medicaments and biological substances status: Secondary | ICD-10-CM | POA: Diagnosis not present

## 2014-02-02 DIAGNOSIS — Z885 Allergy status to narcotic agent status: Secondary | ICD-10-CM | POA: Diagnosis not present

## 2014-02-02 DIAGNOSIS — I129 Hypertensive chronic kidney disease with stage 1 through stage 4 chronic kidney disease, or unspecified chronic kidney disease: Secondary | ICD-10-CM | POA: Diagnosis present

## 2014-02-02 LAB — CBC WITH DIFFERENTIAL/PLATELET
BASOS PCT: 0 % (ref 0–1)
Basophils Absolute: 0 10*3/uL (ref 0.0–0.1)
EOS PCT: 1 % (ref 0–5)
Eosinophils Absolute: 0.1 10*3/uL (ref 0.0–0.7)
HEMATOCRIT: 30.6 % — AB (ref 36.0–46.0)
HEMOGLOBIN: 10.4 g/dL — AB (ref 12.0–15.0)
Lymphocytes Relative: 19 % (ref 12–46)
Lymphs Abs: 0.9 10*3/uL (ref 0.7–4.0)
MCH: 28.1 pg (ref 26.0–34.0)
MCHC: 34 g/dL (ref 30.0–36.0)
MCV: 82.7 fL (ref 78.0–100.0)
MONO ABS: 0.2 10*3/uL (ref 0.1–1.0)
MONOS PCT: 5 % (ref 3–12)
NEUTROS ABS: 3.3 10*3/uL (ref 1.7–7.7)
Neutrophils Relative %: 74 % (ref 43–77)
Platelets: 228 10*3/uL (ref 150–400)
RBC: 3.7 MIL/uL — ABNORMAL LOW (ref 3.87–5.11)
RDW: 14.4 % (ref 11.5–15.5)
WBC: 4.5 10*3/uL (ref 4.0–10.5)

## 2014-02-02 LAB — GLUCOSE, CAPILLARY
GLUCOSE-CAPILLARY: 370 mg/dL — AB (ref 70–99)
GLUCOSE-CAPILLARY: 343 mg/dL — AB (ref 70–99)
GLUCOSE-CAPILLARY: 218 mg/dL — AB (ref 70–99)
GLUCOSE-CAPILLARY: 160 mg/dL — AB (ref 70–99)
GLUCOSE-CAPILLARY: 141 mg/dL — AB (ref 70–99)
GLUCOSE-CAPILLARY: 267 mg/dL — AB (ref 70–99)
Glucose-Capillary: 251 mg/dL — ABNORMAL HIGH (ref 70–99)
Glucose-Capillary: 136 mg/dL — ABNORMAL HIGH (ref 70–99)
Glucose-Capillary: 80 mg/dL (ref 70–99)
Glucose-Capillary: 94 mg/dL (ref 70–99)
Glucose-Capillary: 125 mg/dL — ABNORMAL HIGH (ref 70–99)
Glucose-Capillary: 169 mg/dL — ABNORMAL HIGH (ref 70–99)

## 2014-02-02 LAB — COMPREHENSIVE METABOLIC PANEL
ALBUMIN: 1.6 g/dL — AB (ref 3.5–5.2)
ALT: 25 U/L (ref 0–35)
AST: 27 U/L (ref 0–37)
Alkaline Phosphatase: 87 U/L (ref 39–117)
BUN: 24 mg/dL — AB (ref 6–23)
CHLORIDE: 98 meq/L (ref 96–112)
CO2: 20 meq/L (ref 19–32)
CREATININE: 2.13 mg/dL — AB (ref 0.50–1.10)
Calcium: 8.2 mg/dL — ABNORMAL LOW (ref 8.4–10.5)
GFR calc Af Amer: 31 mL/min — ABNORMAL LOW (ref >90)
GFR, EST NON AFRICAN AMERICAN: 26 mL/min — AB (ref >90)
Glucose, Bld: 669 mg/dL (ref 70–99)
Potassium: 3.7 mEq/L (ref 3.7–5.3)
Sodium: 132 mEq/L — ABNORMAL LOW (ref 137–147)
Total Bilirubin: 0.2 mg/dL — ABNORMAL LOW (ref 0.3–1.2)
Total Protein: 5.9 g/dL — ABNORMAL LOW (ref 6.0–8.3)

## 2014-02-02 LAB — BASIC METABOLIC PANEL
ANION GAP: 14 (ref 5–15)
Anion gap: 14 (ref 5–15)
BUN: 24 mg/dL — ABNORMAL HIGH (ref 6–23)
BUN: 23 mg/dL (ref 6–23)
BUN: 24 mg/dL — ABNORMAL HIGH (ref 6–23)
BUN: 23 mg/dL (ref 6–23)
BUN: 22 mg/dL (ref 6–23)
CO2: 22 mEq/L (ref 19–32)
CO2: 22 mEq/L (ref 19–32)
CO2: 23 mEq/L (ref 19–32)
CO2: 21 mEq/L (ref 19–32)
CO2: 22 mEq/L (ref 19–32)
CREATININE: 2.23 mg/dL — AB (ref 0.50–1.10)
Calcium: 8.6 mg/dL (ref 8.4–10.5)
Calcium: 8.5 mg/dL (ref 8.4–10.5)
Calcium: 8.5 mg/dL (ref 8.4–10.5)
Calcium: 8.5 mg/dL (ref 8.4–10.5)
Calcium: 8.5 mg/dL (ref 8.4–10.5)
Chloride: 99 mEq/L (ref 96–112)
Chloride: 100 mEq/L (ref 96–112)
Chloride: 104 mEq/L (ref 96–112)
Chloride: 103 mEq/L (ref 96–112)
Chloride: 102 mEq/L (ref 96–112)
Creatinine, Ser: 2.08 mg/dL — ABNORMAL HIGH (ref 0.50–1.10)
Creatinine, Ser: 2.17 mg/dL — ABNORMAL HIGH (ref 0.50–1.10)
Creatinine, Ser: 2.21 mg/dL — ABNORMAL HIGH (ref 0.50–1.10)
Creatinine, Ser: 2.11 mg/dL — ABNORMAL HIGH (ref 0.50–1.10)
GFR calc Af Amer: 32 mL/min — ABNORMAL LOW (ref >90)
GFR calc Af Amer: 30 mL/min — ABNORMAL LOW (ref >90)
GFR calc Af Amer: 29 mL/min — ABNORMAL LOW (ref >90)
GFR calc Af Amer: 29 mL/min — ABNORMAL LOW (ref >90)
GFR calc Af Amer: 31 mL/min — ABNORMAL LOW (ref >90)
GFR calc non Af Amer: 27 mL/min — ABNORMAL LOW (ref >90)
GFR calc non Af Amer: 25 mL/min — ABNORMAL LOW (ref >90)
GFR, EST NON AFRICAN AMERICAN: 26 mL/min — AB (ref >90)
GFR, EST NON AFRICAN AMERICAN: 25 mL/min — AB (ref >90)
GFR, EST NON AFRICAN AMERICAN: 27 mL/min — AB (ref >90)
Glucose, Bld: 480 mg/dL — ABNORMAL HIGH (ref 70–99)
Glucose, Bld: 376 mg/dL — ABNORMAL HIGH (ref 70–99)
Glucose, Bld: 271 mg/dL — ABNORMAL HIGH (ref 70–99)
Glucose, Bld: 164 mg/dL — ABNORMAL HIGH (ref 70–99)
Glucose, Bld: 159 mg/dL — ABNORMAL HIGH (ref 70–99)
POTASSIUM: 3.2 meq/L — AB (ref 3.7–5.3)
Potassium: 3.3 mEq/L — ABNORMAL LOW (ref 3.7–5.3)
Potassium: 3.3 mEq/L — ABNORMAL LOW (ref 3.7–5.3)
Potassium: 3.3 mEq/L — ABNORMAL LOW (ref 3.7–5.3)
Potassium: 3.2 mEq/L — ABNORMAL LOW (ref 3.7–5.3)
SODIUM: 137 meq/L (ref 137–147)
SODIUM: 138 meq/L (ref 137–147)
SODIUM: 138 meq/L (ref 137–147)
Sodium: 135 mEq/L — ABNORMAL LOW (ref 137–147)
Sodium: 139 mEq/L (ref 137–147)

## 2014-02-02 LAB — TROPONIN I

## 2014-02-02 LAB — URINALYSIS, ROUTINE W REFLEX MICROSCOPIC
Bilirubin Urine: NEGATIVE
Glucose, UA: >1000 mg/dL — AB
Ketones, ur: NEGATIVE mg/dL
LEUKOCYTES UA: NEGATIVE
NITRITE: NEGATIVE
Specific Gravity, Urine: 1.022 (ref 1.005–1.030)
UROBILINOGEN UA: 0.2 mg/dL (ref 0.0–1.0)
pH: 6 (ref 5.0–8.0)

## 2014-02-02 LAB — CBC
HCT: 32.1 % — ABNORMAL LOW (ref 36.0–46.0)
HEMOGLOBIN: 11 g/dL — AB (ref 12.0–15.0)
MCH: 28.4 pg (ref 26.0–34.0)
MCHC: 34.3 g/dL (ref 30.0–36.0)
MCV: 82.7 fL (ref 78.0–100.0)
PLATELETS: 264 10*3/uL (ref 150–400)
RBC: 3.88 MIL/uL (ref 3.87–5.11)
RDW: 14.6 % (ref 11.5–15.5)
WBC: 6.2 10*3/uL (ref 4.0–10.5)

## 2014-02-02 LAB — RAPID URINE DRUG SCREEN, HOSP PERFORMED
Amphetamines: NOT DETECTED
BARBITURATES: NOT DETECTED
Benzodiazepines: NOT DETECTED
Cocaine: NOT DETECTED
Opiates: NOT DETECTED
Tetrahydrocannabinol: NOT DETECTED

## 2014-02-02 LAB — SODIUM, URINE, RANDOM: Sodium, Ur: 47 mEq/L

## 2014-02-02 LAB — AMMONIA: Ammonia: 39 umol/L (ref 11–60)

## 2014-02-02 LAB — PREGNANCY, URINE: Preg Test, Ur: NEGATIVE

## 2014-02-02 LAB — CBG MONITORING, ED
GLUCOSE-CAPILLARY: 465 mg/dL — AB (ref 70–99)
Glucose-Capillary: 494 mg/dL — ABNORMAL HIGH (ref 70–99)
Glucose-Capillary: >600 mg/dL (ref 70–99)
Glucose-Capillary: 455 mg/dL — ABNORMAL HIGH (ref 70–99)

## 2014-02-02 LAB — MRSA PCR SCREENING: MRSA by PCR: NEGATIVE

## 2014-02-02 LAB — URINE MICROSCOPIC-ADD ON

## 2014-02-02 LAB — PHOSPHORUS: PHOSPHORUS: 2.1 mg/dL — AB (ref 2.3–4.6)

## 2014-02-02 LAB — ETHANOL

## 2014-02-02 LAB — HEMOGLOBIN A1C
Hgb A1c MFr Bld: 12.4 % — ABNORMAL HIGH (ref <5.7)
Mean Plasma Glucose: 309 mg/dL — ABNORMAL HIGH (ref <117)

## 2014-02-02 LAB — NO BLOOD PRODUCTS

## 2014-02-02 LAB — MAGNESIUM: Magnesium: 1.9 mg/dL (ref 1.5–2.5)

## 2014-02-02 MED ORDER — ZIPRASIDONE MESYLATE 20 MG IM SOLR
20.0000 mg | Freq: Once | INTRAMUSCULAR | Status: AC
  Administered 2014-02-02: 20 mg via INTRAMUSCULAR

## 2014-02-02 MED ORDER — DEXTROSE-NACL 5-0.45 % IV SOLN: INTRAVENOUS | Status: DC

## 2014-02-02 MED ORDER — ACETAMINOPHEN 650 MG RE SUPP: 650.0000 mg | Freq: Four times a day (QID) | RECTAL | Status: DC | PRN

## 2014-02-02 MED ORDER — METOPROLOL TARTRATE 1 MG/ML IV SOLN
5.0000 mg | Freq: Three times a day (TID) | INTRAVENOUS | Status: DC
  Administered 2014-02-02 – 2014-02-03 (×3): 5 mg via INTRAVENOUS
  Filled 2014-02-02 (×3): qty 5

## 2014-02-02 MED ORDER — POTASSIUM CHLORIDE CRYS ER 20 MEQ PO TBCR
40.0000 meq | EXTENDED_RELEASE_TABLET | ORAL | Status: AC
  Administered 2014-02-02 (×2): 40 meq via ORAL
  Filled 2014-02-02 (×2): qty 2

## 2014-02-02 MED ORDER — ONDANSETRON HCL 4 MG PO TABS: 4.0000 mg | ORAL_TABLET | Freq: Four times a day (QID) | ORAL | Status: DC | PRN

## 2014-02-02 MED ORDER — SODIUM CHLORIDE 0.9 % IV SOLN
INTRAVENOUS | Status: DC
  Administered 2014-02-02 – 2014-02-03 (×2): via INTRAVENOUS
  Administered 2014-02-03: 1000 mL via INTRAVENOUS
  Administered 2014-02-04: 17:00:00 via INTRAVENOUS

## 2014-02-02 MED ORDER — ENOXAPARIN SODIUM 30 MG/0.3ML ~~LOC~~ SOLN
30.0000 mg | SUBCUTANEOUS | Status: DC
  Administered 2014-02-02 – 2014-02-03 (×2): 30 mg via SUBCUTANEOUS
  Filled 2014-02-02 (×3): qty 0.3

## 2014-02-02 MED ORDER — SODIUM CHLORIDE 0.9 % IV SOLN
INTRAVENOUS | Status: DC
  Administered 2014-02-02: 8.5 [IU]/h via INTRAVENOUS
  Filled 2014-02-02: qty 1

## 2014-02-02 MED ORDER — INSULIN REGULAR BOLUS VIA INFUSION
0.0000 [IU] | Freq: Three times a day (TID) | INTRAVENOUS | Status: DC
  Filled 2014-02-02: qty 10

## 2014-02-02 MED ORDER — DEXTROSE 50 % IV SOLN
25.0000 mL | INTRAVENOUS | Status: DC | PRN
Start: 2014-02-02 — End: 2014-02-07

## 2014-02-02 MED ORDER — POTASSIUM CHLORIDE 10 MEQ/100ML IV SOLN
10.0000 meq | INTRAVENOUS | Status: AC
  Administered 2014-02-02 (×4): 10 meq via INTRAVENOUS
  Filled 2014-02-02 (×4): qty 100

## 2014-02-02 MED ORDER — NICARDIPINE HCL IN NACL 20-0.86 MG/200ML-% IV SOLN
3.0000 mg/h | INTRAVENOUS | Status: DC
  Administered 2014-02-02 (×3): 5 mg/h via INTRAVENOUS
  Filled 2014-02-02 (×3): qty 200

## 2014-02-02 MED ORDER — NICARDIPINE HCL IN NACL 20-0.86 MG/200ML-% IV SOLN
3.0000 mg/h | INTRAVENOUS | Status: DC
Start: 2014-02-02 — End: 2014-02-02

## 2014-02-02 MED ORDER — ZIPRASIDONE MESYLATE 20 MG IM SOLR
INTRAMUSCULAR | Status: AC
  Administered 2014-02-02: 20 mg via INTRAMUSCULAR
  Filled 2014-02-02: qty 20

## 2014-02-02 MED ORDER — SODIUM CHLORIDE 0.9 % IJ SOLN
3.0000 mL | Freq: Two times a day (BID) | INTRAMUSCULAR | Status: DC
  Administered 2014-02-02 – 2014-02-10 (×7): 3 mL via INTRAVENOUS

## 2014-02-02 MED ORDER — INSULIN REGULAR HUMAN 100 UNIT/ML IJ SOLN: INTRAMUSCULAR | Status: AC

## 2014-02-02 MED ORDER — LORAZEPAM 2 MG/ML IJ SOLN
1.0000 mg | Freq: Once | INTRAMUSCULAR | Status: AC
  Administered 2014-02-02: 1 mg via INTRAVENOUS
  Filled 2014-02-02: qty 1

## 2014-02-02 MED ORDER — SODIUM CHLORIDE 0.9 % IV SOLN: INTRAVENOUS | Status: DC

## 2014-02-02 MED ORDER — SODIUM CHLORIDE 0.9 % IV SOLN: 500.0000 mg | Freq: Two times a day (BID) | INTRAVENOUS | Status: DC

## 2014-02-02 MED ORDER — ACETAMINOPHEN 325 MG PO TABS
650.0000 mg | ORAL_TABLET | Freq: Four times a day (QID) | ORAL | Status: DC | PRN
  Administered 2014-02-05 (×2): 650 mg via ORAL
  Filled 2014-02-02 (×2): qty 2

## 2014-02-02 MED ORDER — SODIUM CHLORIDE 0.9 % IV SOLN: 250.0000 mL | INTRAVENOUS | Status: DC | PRN

## 2014-02-02 MED ORDER — SODIUM CHLORIDE 0.9 % IV SOLN
500.0000 mg | Freq: Two times a day (BID) | INTRAVENOUS | Status: DC
  Administered 2014-02-02: 500 mg via INTRAVENOUS
  Filled 2014-02-02 (×2): qty 5

## 2014-02-02 MED ORDER — INSULIN GLARGINE 100 UNIT/ML ~~LOC~~ SOLN
10.0000 [IU] | Freq: Every day | SUBCUTANEOUS | Status: DC
  Administered 2014-02-02: 10 [IU] via SUBCUTANEOUS
  Filled 2014-02-02: qty 0.1

## 2014-02-02 MED ORDER — SODIUM CHLORIDE 0.9 % IV SOLN
INTRAVENOUS | Status: DC
  Administered 2014-02-02: 4.1 [IU]/h via INTRAVENOUS
  Filled 2014-02-02: qty 1

## 2014-02-02 MED ORDER — DEXTROSE 50 % IV SOLN: 25.0000 mL | INTRAVENOUS | Status: DC | PRN

## 2014-02-02 MED ORDER — SODIUM CHLORIDE 0.9 % IV SOLN
INTRAVENOUS | Status: DC
  Administered 2014-02-02: 05:00:00 via INTRAVENOUS

## 2014-02-02 MED ORDER — HYDROMORPHONE HCL PF 1 MG/ML IJ SOLN
0.5000 mg | INTRAMUSCULAR | Status: DC | PRN
  Administered 2014-02-05: 1 mg via INTRAVENOUS
  Filled 2014-02-02: qty 1

## 2014-02-02 MED ORDER — LORAZEPAM 2 MG/ML IJ SOLN
1.0000 mg | INTRAMUSCULAR | Status: DC | PRN
  Administered 2014-02-03 (×2): 1 mg via INTRAVENOUS
  Filled 2014-02-02 (×2): qty 1

## 2014-02-02 MED ORDER — STERILE WATER FOR INJECTION IJ SOLN
INTRAMUSCULAR | Status: AC
  Administered 2014-02-02: 1.2 mL
  Filled 2014-02-02: qty 10

## 2014-02-02 MED ORDER — ONDANSETRON HCL 4 MG/2ML IJ SOLN: 4.0000 mg | Freq: Four times a day (QID) | INTRAMUSCULAR | Status: DC | PRN

## 2014-02-02 MED ORDER — NICARDIPINE HCL IN NACL 20-0.86 MG/200ML-% IV SOLN
5.0000 mg/h | Freq: Once | INTRAVENOUS | Status: AC
  Administered 2014-02-02: 5 mg/h via INTRAVENOUS
  Filled 2014-02-02: qty 200

## 2014-02-02 MED ORDER — INSULIN ASPART 100 UNIT/ML ~~LOC~~ SOLN
0.0000 [IU] | Freq: Three times a day (TID) | SUBCUTANEOUS | Status: DC
Start: 2014-02-02 — End: 2014-02-03
  Administered 2014-02-02: 1 [IU] via SUBCUTANEOUS
  Administered 2014-02-02: 2 [IU] via SUBCUTANEOUS

## 2014-02-02 MED ORDER — DEXTROSE-NACL 5-0.45 % IV SOLN
INTRAVENOUS | Status: DC
  Administered 2014-02-02: 20:00:00 via INTRAVENOUS
  Administered 2014-02-02: 1000 mL via INTRAVENOUS

## 2014-02-02 MED ORDER — SODIUM CHLORIDE 0.9 % IJ SOLN: 3.0000 mL | INTRAMUSCULAR | Status: DC | PRN

## 2014-02-02 MED ORDER — LABETALOL HCL 5 MG/ML IV SOLN
10.0000 mg | Freq: Once | INTRAVENOUS | Status: AC
  Administered 2014-02-02: 10 mg via INTRAVENOUS
  Filled 2014-02-02: qty 4

## 2014-02-02 MED ORDER — ENALAPRILAT 1.25 MG/ML IV SOLN
0.6250 mg | Freq: Four times a day (QID) | INTRAVENOUS | Status: DC
  Administered 2014-02-02 – 2014-02-03 (×3): 0.625 mg via INTRAVENOUS
  Filled 2014-02-02 (×7): qty 0.5

## 2014-02-02 MED ORDER — POTASSIUM CHLORIDE CRYS ER 20 MEQ PO TBCR: 40.0000 meq | EXTENDED_RELEASE_TABLET | Freq: Once | ORAL | Status: DC

## 2014-02-02 MED ORDER — SODIUM CHLORIDE 0.9 % IV SOLN
1000.0000 mg | Freq: Two times a day (BID) | INTRAVENOUS | Status: DC
  Administered 2014-02-02 (×2): 1000 mg via INTRAVENOUS
  Filled 2014-02-02 (×2): qty 10

## 2014-02-02 MED ORDER — POTASSIUM CHLORIDE 10 MEQ/100ML IV SOLN
10.0000 meq | INTRAVENOUS | Status: AC
  Administered 2014-02-02 (×2): 10 meq via INTRAVENOUS
  Filled 2014-02-02: qty 100

## 2014-02-02 NOTE — ED Provider Notes (Signed)
CSN: [id number]     Arrival date & time 02/01/14  2333 History   First MD Initiated Contact with Patient 02/01/14 2343     Chief Complaint  Patient presents with  . Seizures     (Consider location/radiation/quality/duration/timing/severity/associated sxs/prior Treatment) HPI Patient presents via EMS. She witness seizure by family. When EMS arrived the patient was post ictal and noncontributory to the history. Her blood pressure was elevated in route as well as blood sugar which read greater than 600. Daughter states patient had a generalized tonic-clonic type seizure for 3 minutes. The patient is currently confused and unable to contribute history. Level V caveat applies. Past Medical History  Diagnosis Date  . Neuropathy   . Glaucoma   . Bipolar 1 disorder   . CHF (congestive heart failure)   . Refusal of blood transfusions as patient is Jehovah's Witness   . Hypertension   . Shortness of breath   . Diabetes mellitus     INSULIN DEPENDENT  . TIA (transient ischemic attack)   . Neuromuscular disorder     DIABETIC NEUROPATHY  . Chronic kidney disease   . Hepatitis C virus    Past Surgical History  Procedure Laterality Date  . Abdominal hysterectomy    . Amputation Left 05/21/2013    Procedure: Left Midfoot AMPUTATION;  Surgeon: Newt Minion, MD;  Location: [facility];  Service: Orthopedics;  Laterality: Left;  Left Midfoot Amputation  . Tubal ligation    . Colonoscopy with propofol N/A 06/22/2013    Procedure: COLONOSCOPY WITH PROPOFOL;  Surgeon: Jeryl Columbia, MD;  Location: [facility];  Service: Endoscopy;  Laterality: N/A;   History reviewed. No pertinent family history. History  Substance Use Topics  . Smoking status: Current Every Day Smoker -- 0.25 packs/day for 30 years    Types: Cigarettes  . Smokeless tobacco: Never Used  . Alcohol Use: Yes     Comment: @ 1/week   OB History   Grav Para Term Preterm Abortions TAB SAB Ect Mult Living                 Review of  Systems  Unable to perform ROS Neurological: Positive for seizures.      Allergies  Gabapentin; Tramadol; and Penicillins  Home Medications   Prior to Admission medications   Medication Sig Start Date End Date Taking? Authorizing Provider  acetaminophen (TYLENOL) 500 MG tablet Take 1,000 mg by mouth every 6 (six) hours as needed for mild pain.    Historical Provider, MD  asenapine (SAPHRIS) 5 MG SUBL 24 hr tablet Place 5 mg under the tongue at bedtime.    Historical Provider, MD  BLACK COHOSH EXTRACT PO Take 2 tablets by mouth daily.    Historical Provider, MD  carvedilol (COREG) 12.5 MG tablet Take 12.5 mg by mouth 2 (two) times daily with a meal.    Historical Provider, MD  clindamycin (CLEOCIN) 150 MG capsule Take 1 capsule (150 mg total) by mouth every 6 (six) hours. 02/01/14   Domenic Moras, PA-C  DULoxetine (CYMBALTA) 60 MG capsule Take 60 mg by mouth daily.     Historical Provider, MD  insulin aspart (NOVOLOG) 100 UNIT/ML injection Inject 2-20 Units into the skin 3 (three) times daily before meals. Sliding scale    Historical Provider, MD  insulin glargine (LANTUS) 100 UNIT/ML injection Inject 45 Units into the skin at bedtime.     Historical Provider, MD  levETIRAcetam (KEPPRA) 500 MG tablet Take 1 tablet (500  mg total) by mouth 2 (two) times daily. 02/01/14   Domenic Moras, PA-C  Multiple Vitamin (MULTIVITAMIN WITH MINERALS) TABS tablet Take 1 tablet by mouth daily.    Historical Provider, MD  polyethylene glycol (MIRALAX / GLYCOLAX) packet Take 17 g by mouth 2 (two) times daily as needed for mild constipation.    Historical Provider, MD   BP 253/114  Pulse 86  Temp(Src) 97.2 F (36.2 C) (Oral)  Resp 18  SpO2 100% Physical Exam  Nursing note and vitals reviewed. Constitutional: She appears well-developed and well-nourished. No distress.  Patient is awake and combative  HENT:  Head: Normocephalic and atraumatic.  Mouth/Throat: Oropharynx is clear and moist.  No evidence of any  intraoral trauma.  Eyes: EOM are normal. Pupils are equal, round, and reactive to light.  Neck: Normal range of motion. Neck supple.  No meningismus. No posterior cervical midline tenderness.  Cardiovascular: Normal rate and regular rhythm.   Pulmonary/Chest: Effort normal and breath sounds normal. No respiratory distress. She has no wheezes. She has no rales.  Abdominal: Soft. Bowel sounds are normal. She exhibits no distension and no mass. There is no tenderness. There is no rebound and no guarding.  Musculoskeletal: Normal range of motion. She exhibits no edema and no tenderness.  Neurological:  Patient is agitated. Nonsensical speech. She is moving all of her extremities equally.  Skin: Skin is warm and dry. No rash noted. No erythema.    ED Course  Procedures (including critical care time) Labs Review Labs Reviewed  CBC WITH DIFFERENTIAL - Abnormal; Notable for the following:    RBC 3.70 (*)    Hemoglobin 10.4 (*)    HCT 30.6 (*)    All other components within normal limits  COMPREHENSIVE METABOLIC PANEL  TROPONIN I  URINALYSIS, ROUTINE W REFLEX MICROSCOPIC  PREGNANCY, URINE  URINE RAPID DRUG SCREEN (HOSP PERFORMED)  ETHANOL    Imaging Review No results found.   EKG Interpretation   Date/Time:  Tuesday February 01 2014 23:54:36 EDT Ventricular Rate:  96 PR Interval:  161 QRS Duration: 86 QT Interval:  396 QTC Calculation: 500 R Axis:   75 Text Interpretation:  Sinus rhythm Probable LVH with secondary repol abnrm  Borderline prolonged QT interval Baseline wander in lead(s) I III aVR aVL  aVF Confirmed by Lita Mains  MD, Zanyah Lentsch ([id number]) on 02/02/2014 2:57:59 AM     CRITICAL CARE Performed by: Lita Mains, Adonte Vanriper Total critical care time: 30 min Critical care time was exclusive of separately billable procedures and treating other patients. Critical care was necessary to treat or prevent imminent or life-threatening deterioration. Critical care was time spent personally by  me on the following activities: development of treatment plan with patient and/or surrogate as well as nursing, discussions with consultants, evaluation of patient's response to treatment, examination of patient, obtaining history from patient or surrogate, ordering and performing treatments and interventions, ordering and review of laboratory studies, ordering and review of radiographic studies, pulse oximetry and re-evaluation of patient's condition.  MDM   Final diagnoses:  None    Patient requiring Geodon and Ativan in emergency department for sedation do to agitation and combativeness.  Patient is now resting comfortably. Her blood pressure improved with IV dose of labetalol but has rebounded. Her glucose continues to drop. I discussed with Dr. Arnoldo Morale. Will start on Cardizem drip and have patient admitted to the ICU. She continues to protect her airway without the need for acute intervention.  Julianne Rice, MD 02/02/14 [phone]

## 2014-02-02 NOTE — ED Notes (Signed)
Patient voided, family was unaware we were in need of a urine sample and discarded the urine. Family advised if patient voids again we need to collect a sample for analysis.

## 2014-02-02 NOTE — ED Notes (Signed)
Patient transported to CT 

## 2014-02-02 NOTE — Progress Notes (Signed)
Pt with b/p of 226/122. Called midlevel awaiting call back.

## 2014-02-02 NOTE — Progress Notes (Signed)
Inpatient Diabetes Program Recommendations  AACE/ADA: New Consensus Statement on Inpatient Glycemic Control (2013)  Target Ranges:  Prepandial:   less than 140 mg/dL      Peak postprandial:   less than 180 mg/dL (1-2 hours)      Critically ill patients:  140 - 180 mg/dL   Reason for Visit: Consult  Diabetes history: DM2 Outpatient Diabetes medications: Lantus 45 units QHS, Novolog s/s Current orders for Inpatient glycemic control: GllucoStabilizer  Pt drifting in and out of sleep. Son present and states he does not know anything about pt's meds at home.  Results for LAKEICHA, BRUER (MRN [id number]) as of 02/02/2014 14:35  Ref. Range 02/02/2014 10:50  Sodium Latest Range: 137-147 mEq/L 138  Potassium Latest Range: 3.7-5.3 mEq/L 3.3 (L)  Chloride Latest Range: 96-112 mEq/L 103  CO2 Latest Range: 19-32 mEq/L 21  BUN Latest Range: 6-23 mg/dL 23  Creatinine Latest Range: 0.50-1.10 mg/dL 2.23 (H)  Calcium Latest Range: 8.4-10.5 mg/dL 8.5  GFR calc non Af Amer Latest Range: >90 mL/min 25 (L)  GFR calc Af Amer Latest Range: >90 mL/min 29 (L)  Glucose Latest Range: 70-99 mg/dL 164 (H)  Results for LAQUENTA, YA (MRN [id number]) as of 02/02/2014 14:35  Ref. Range 02/02/2014 08:42 02/02/2014 09:57 02/02/2014 10:44 02/02/2014 11:25 02/02/2014 13:39  Glucose-Capillary Latest Range: 70-99 mg/dL 218 (H) 136 (H) 94 80 125 (H)   Results for OLIVINE, KADISH (MRN [id number]) as of 02/02/2014 14:35  Ref. Range 02/09/2013 13:52  Hemoglobin A1C Latest Range: <5.7 % 9.2 (H)   Transitioning off insulin drip with acidosis cleared. Given Lantus 10 units 2 hours prior to discontinuation of drip.  Recommendations: Increase Lantus to 20 units QAM Add HS correction Add Novolog 3 units tidwc for meal coverage insulin when diet is advanced. Would recommend Novolog sensitive Q4H x 12 hours of discontinuing drip.  Will f/u tomorrow am to discuss her glucose control at home. Thank you. Lorenda Peck, RD, LDN, CDE Inpatient  Diabetes Coordinator [phone]

## 2014-02-02 NOTE — Consult Note (Signed)
NEURO HOSPITALIST CONSULT NOTE    Reason for Consult: Seizure  HPI:                                                                                                                                          [name] is an 48 y.o. female with history of seizure, IDDM, HTN, CHF and Bipolar disease.   Most of history is obtained from chart and son but still very limited.  She was diagnosed with seizures one year ago back in [place].  Since then she has been on seizure medication (exact doses and types not known).  She has been doing well and seizure well controlled until recently.  Previous seizures described as face drawing up and eyes rolled back.  She lives with her daughter (unable to get in contact with daughter).  Son states "if I know my mom I doubt she has been taking her medications as prescribed", but this is not confirmed.  Patient was brought to ED after daughter witnessed seizure.  She has had no further seizures while in hospital. She has remained post ictal.  On arrival BP was 220/120 and BG in 600's.  She has been admitted to ICU and placed on Cardene drip. Currently she is arouses with moderate stimuli but falls asleep if not stimulated. BP 108/67, on insulin drip with BG 218.   Currently received 1 mg Ativan, 1 gram Keppra at 0521.   Cr is 2.21 most recent previous hospitalization was 1.4-1.6  Past Medical History  Diagnosis Date  . Neuropathy   . Glaucoma   . Bipolar 1 disorder   . CHF (congestive heart failure)   . Refusal of blood transfusions as patient is Jehovah's Witness   . Hypertension   . Shortness of breath   . Diabetes mellitus     INSULIN DEPENDENT  . TIA (transient ischemic attack)   . Neuromuscular disorder     DIABETIC NEUROPATHY  . Chronic kidney disease   . Hepatitis C virus   . Seizure     diagnosed 2014    Past Surgical History  Procedure Laterality Date  . Abdominal hysterectomy    . Amputation Left 05/21/2013     Procedure: Left Midfoot AMPUTATION;  Surgeon: Newt Minion, MD;  Location: [facility];  Service: Orthopedics;  Laterality: Left;  Left Midfoot Amputation  . Tubal ligation    . Colonoscopy with propofol N/A 06/22/2013    Procedure: COLONOSCOPY WITH PROPOFOL;  Surgeon: Jeryl Columbia, MD;  Location: [facility];  Service: Endoscopy;  Laterality: N/A;    Family History  Problem Relation Age of Onset  . Hypertension Mother   . Hypertension Father      Social History:  reports that she has been smoking Cigarettes.  She has a 7.5 pack-year smoking history. She has never used smokeless tobacco. She reports that she drinks alcohol. She reports that she does not use illicit drugs.  Allergies  Allergen Reactions  . Gabapentin Itching and Swelling  . Tramadol     Causes legs to swell  . Penicillins Hives    MEDICATIONS:                                                                                                                     Prior to Admission:  Prescriptions prior to admission  Medication Sig Dispense Refill  . acetaminophen (TYLENOL) 500 MG tablet Take 1,000 mg by mouth every 6 (six) hours as needed for mild pain.      Marland Kitchen asenapine (SAPHRIS) 5 MG SUBL 24 hr tablet Place 5 mg under the tongue at bedtime.      Marland Kitchen BLACK COHOSH EXTRACT PO Take 2 tablets by mouth daily.      . carvedilol (COREG) 12.5 MG tablet Take 12.5 mg by mouth 2 (two) times daily with a meal.      . clindamycin (CLEOCIN) 150 MG capsule Take 1 capsule (150 mg total) by mouth every 6 (six) hours.  28 capsule  0  . DULoxetine (CYMBALTA) 60 MG capsule Take 60 mg by mouth daily.       . insulin aspart (NOVOLOG) 100 UNIT/ML injection Inject 2-20 Units into the skin 3 (three) times daily before meals. Sliding scale      . insulin glargine (LANTUS) 100 UNIT/ML injection Inject 45 Units into the skin at bedtime.       . levETIRAcetam (KEPPRA) 500 MG tablet Take 1 tablet (500 mg total) by mouth 2 (two) times daily.  60 tablet  0   . Multiple Vitamin (MULTIVITAMIN WITH MINERALS) TABS tablet Take 1 tablet by mouth daily.      . polyethylene glycol (MIRALAX / GLYCOLAX) packet Take 17 g by mouth 2 (two) times daily as needed for mild constipation.       Scheduled: . enoxaparin (LOVENOX) injection  30 mg Subcutaneous Q24H  . insulin regular  0-10 Units Intravenous TID WC  . levETIRAcetam  1,000 mg Intravenous BID  . sodium chloride  3 mL Intravenous Q12H     ROS:  History obtained from Son  General ROS: negative for - chills, fatigue, fever, night sweats, weight gain or weight loss Psychological ROS: negative for - behavioral disorder, hallucinations, memory difficulties, mood swings or suicidal ideation Ophthalmic ROS: negative for - blurry vision, double vision, eye pain or loss of vision ENT ROS: negative for - epistaxis, nasal discharge, oral lesions, sore throat, tinnitus or vertigo Allergy and Immunology ROS: negative for - hives or itchy/watery eyes Hematological and Lymphatic ROS: negative for - bleeding problems, bruising or swollen lymph nodes Endocrine ROS: negative for - galactorrhea, hair pattern changes, polydipsia/polyuria or temperature intolerance Respiratory ROS: negative for - cough, hemoptysis, shortness of breath or wheezing Cardiovascular ROS: negative for - chest pain, dyspnea on exertion, edema or irregular heartbeat Gastrointestinal ROS: negative for - abdominal pain, diarrhea, hematemesis, nausea/vomiting or stool incontinence Genito-Urinary ROS: negative for - dysuria, hematuria, incontinence or urinary frequency/urgency Musculoskeletal ROS: negative for - joint swelling or muscular weakness Neurological ROS: as noted in HPI Dermatological ROS: negative for rash and skin lesion changes   Blood pressure 108/67, pulse 94, temperature 99.3 F (37.4 C),  temperature source Oral, resp. rate 29, height 5\' 6"  (1.676 m), weight 73.9 kg (162 lb 14.7 oz), SpO2 100.00%.   Neurologic Examination:                                                                                                       Mental Status: Arouses to sternal rub and noxious stimuli. Will stay awake for a few minutes before drifting to sleep.  Able to follow simple commands briefly.  Speech clear with no dysarthria but minimal. Cranial Nerves: II: Discs flat bilaterally; Visual fields grossly normal, pupils equal, round, reactive to light and accommodation III,IV, VI: ptosis not present, extra-ocular motions intact bilaterally V,VII: smile symmetric, facial light touch sensation normal bilaterally VIII: hearing normal bilaterally IX,X: gag reflex present XI: bilateral shoulder shrug XII: midline tongue extension   Motor: Moving all extremities antigravity both spontaneously and purposefully.  The left slightly more than right and localizes with the left hand (she is left handed) Sensory: withdraws to noxious stimuli bilaterally UE and LE Deep Tendon Reflexes:  Right: Upper Extremity   Left: Upper extremity   biceps (C-5 to C-6) 2/4   biceps (C-5 to C-6) 2/4 tricep (C7) 2/4    triceps (C7) 2/4 Brachioradialis (C6) 2/4  Brachioradialis (C6) 2/4  Lower Extremity Lower Extremity  quadriceps (L-2 to L-4) 1/4   quadriceps (L-2 to L-4) 2/4 Achilles (S1) 0/4   Achilles (S1) 0/4  Plantars: Right: downgoing   Left: digit amputaion Cerebellar: Unable to assess due to post ictal state Gait: not tested CV: pulses palpable throughout   Lab Results: Basic Metabolic Panel:  Recent Labs Lab 01/31/14 2034 02/02/14 0006 02/02/14 0435 02/02/14 0630 02/02/14 0818  NA 131* 132* 135* 137 139  K 4.0 3.7 3.3* 3.2* 3.3*  CL 95* 98 99 100 104  CO2 23 20 22 22 23   GLUCOSE 508* 669* 480* 376* 271*  BUN 22 24* 24* 23 24*  CREATININE 1.97*  2.13* 2.08* 2.17* 2.21*  CALCIUM 8.5 8.2*  8.6 8.5 8.5  MG  --   --   --   --  1.9    Liver Function Tests:  Recent Labs Lab 02/02/14 0006  AST 27  ALT 25  ALKPHOS 87  BILITOT 0.2*  PROT 5.9*  ALBUMIN 1.6*   No results found for this basename: LIPASE, AMYLASE,  in the last 168 hours  Recent Labs Lab 02/02/14 0432  AMMONIA 39    CBC:  Recent Labs Lab 01/31/14 2034 02/02/14 0006 02/02/14 0630  WBC 6.2 4.5 6.2  NEUTROABS  --  3.3  --   HGB 12.1 10.4* 11.0*  HCT 34.7* 30.6* 32.1*  MCV 81.1 82.7 82.7  PLT 356 228 264    Cardiac Enzymes:  Recent Labs Lab 02/02/14 0006  TROPONINI <0.30    Lipid Panel: No results found for this basename: CHOL, TRIG, HDL, CHOLHDL, VLDL, LDLCALC,  in the last 168 hours  CBG:  Recent Labs Lab 02/02/14 0537 02/02/14 0642 02/02/14 0740 02/02/14 0842 02/02/14 0957  GLUCAP 370* 343* 251* 218* 136*    Microbiology: Results for orders placed during the hospital encounter of 02/01/14  MRSA PCR SCREENING     Status: None   Collection Time    02/02/14  5:29 AM      Result Value Ref Range Status   MRSA by PCR NEGATIVE  NEGATIVE Final   Comment:            The GeneXpert MRSA Assay (FDA     approved for NASAL specimens     only), is one component of a     comprehensive MRSA colonization     surveillance program. It is not     intended to diagnose MRSA     infection nor to guide or     monitor treatment for     MRSA infections.     Performed at [facility]    Coagulation Studies: No results found for this basename: LABPROT, INR,  in the last 72 hours  Imaging: Ct Head Wo Contrast  02/02/2014   CLINICAL DATA:  Seizure.  Altered mental status.  EXAM: CT HEAD WITHOUT CONTRAST  TECHNIQUE: Contiguous axial images were obtained from the base of the skull through the vertex without intravenous contrast.  COMPARISON:  02/09/2013  FINDINGS: Skull and Sinuses:Negative for fracture or destructive process. The mastoids, middle ears, and imaged paranasal sinuses are  clear.  Orbits: Bilateral cataract resection.  Brain: No evidence of acute abnormality, such as acute infarction, hemorrhage, hydrocephalus, or mass lesion/mass effect. There is chronic enlargement of the sylvian fissures bilaterally. On brain MRI from 2009 there was no definitive polymicrogyria in this region. There is a prominent medial left occipital lobe sulcus, also stable. Dilated perivascular space noted in the low left cerebrum.  IMPRESSION: 1. No acute intracranial findings. 2. Brain atrophy with a perisylvian predominance.   Electronically Signed   By: Jorje Guild M.D.   On: 02/02/2014 00:54    Etta Quill PA-C [facility] [phone]  02/02/2014, 10:01 AM  Patient seen and examined.  Clinical course and management discussed.  Necessary edits performed.  I agree with the above.  Assessment and plan of care developed and discussed below.    Assessment/Plan: 48 YO female with known seizure disorder well controlled on Keppra 500 mg BID per son. She presents with break through seizure in setting of hypertensive emergency and Glucose of 669.  There is possibility  of medication noncompliance as well.    Recommendations: 1) Continue home dose of Keppra 500 mg BID IV and may change to PO when awake and take PO medications 2) BP and Glucose control 3) Agree with MRI  Addendum: Patient now more alert.  She admits to noncompliance.  This explains her presentation.  Would continue with above plan.  Case discussed with Dr. Jerrol Banana, MD [facility] [phone]  02/02/2014  6:57 PM

## 2014-02-02 NOTE — Progress Notes (Signed)
Called [facility] to get pt ssi for hs coverage and to get pt something to help sleep at night. Awaiting call back.

## 2014-02-02 NOTE — H&P (Signed)
[facility] History and Physical  [name] [id number] DOB: 03-23-1966 DOA: 02/01/2014  Referring physician: EDP PCP: [name], MD  Specialists:   Chief Complaint:  Seizure  HPI: [name] is a 48 y.o. female with a history of Seizures, IDDM, HTN, CHF, and Bipolar Disorder who was brought to the ED after her daughter witnessed her having a seizure.   She arrived to the ED and was post -Ictal with Blood pressures measuring 220/120.  She was given Labetalol and had transient improvement but her blood pressure rebounded back into the 0000000 systolic.   She was then placed on a Cardene drip for  blood pressure control.   Her lab studies revealed a glucose levelof  669 and she was placed on the glucose stabilizer.   She was referred for medical admission.   Her son is at the bedside and he is assisting with the medical history.       Review of Systems: Unable to Obtain from the Patient.    Past Medical History  Diagnosis Date  . Neuropathy   . Glaucoma   . Bipolar 1 disorder   . CHF (congestive heart failure)   . Refusal of blood transfusions as patient is Jehovah's Witness   . Hypertension   . Shortness of breath   . Diabetes mellitus     INSULIN DEPENDENT  . TIA (transient ischemic attack)   . Neuromuscular disorder     DIABETIC NEUROPATHY  . Chronic kidney disease   . Hepatitis C virus       Past Surgical History  Procedure Laterality Date  . Abdominal hysterectomy    . Amputation Left 05/21/2013    Procedure: Left Midfoot AMPUTATION;  Surgeon: Newt Minion, MD;  Location: [facility];  Service: Orthopedics;  Laterality: Left;  Left Midfoot Amputation  . Tubal ligation    . Colonoscopy with propofol N/A 06/22/2013    Procedure: COLONOSCOPY WITH PROPOFOL;  Surgeon: Jeryl Columbia, MD;  Location: [facility];  Service: Endoscopy;  Laterality: N/A;       Prior to Admission medications   Medication Sig Start Date End Date Taking? Authorizing Provider   acetaminophen (TYLENOL) 500 MG tablet Take 1,000 mg by mouth every 6 (six) hours as needed for mild pain.    Historical Provider, MD  asenapine (SAPHRIS) 5 MG SUBL 24 hr tablet Place 5 mg under the tongue at bedtime.    Historical Provider, MD  BLACK COHOSH EXTRACT PO Take 2 tablets by mouth daily.    Historical Provider, MD  carvedilol (COREG) 12.5 MG tablet Take 12.5 mg by mouth 2 (two) times daily with a meal.    Historical Provider, MD  clindamycin (CLEOCIN) 150 MG capsule Take 1 capsule (150 mg total) by mouth every 6 (six) hours. 02/01/14   Domenic Moras, PA-C  DULoxetine (CYMBALTA) 60 MG capsule Take 60 mg by mouth daily.     Historical Provider, MD  insulin aspart (NOVOLOG) 100 UNIT/ML injection Inject 2-20 Units into the skin 3 (three) times daily before meals. Sliding scale    Historical Provider, MD  insulin glargine (LANTUS) 100 UNIT/ML injection Inject 45 Units into the skin at bedtime.     Historical Provider, MD  levETIRAcetam (KEPPRA) 500 MG tablet Take 1 tablet (500 mg total) by mouth 2 (two) times daily. 02/01/14   Domenic Moras, PA-C  Multiple Vitamin (MULTIVITAMIN WITH MINERALS) TABS tablet Take 1 tablet by mouth daily.    Historical Provider, MD  polyethylene glycol (MIRALAX / GLYCOLAX) packet Take 17 g by mouth 2 (two) times daily as needed for mild constipation.    Historical Provider, MD      Allergies  Allergen Reactions  . Gabapentin Itching and Swelling  . Tramadol     Causes legs to swell  . Penicillins Hives     Social History:  reports that she has been smoking Cigarettes.  She has a 7.5 pack-year smoking history. She has never used smokeless tobacco. She reports that she drinks alcohol. She reports that she does not use illicit drugs.     History reviewed. No pertinent family history.     Physical Exam:  GEN:  Obtunded Thin  48 y.o. African American female examined and in no acute distress; cooperative with exam Filed Vitals:   02/02/14 0245 02/02/14 0300  02/02/14 0315 02/02/14 0330  BP: 203/107 178/98 198/108 211/115  Pulse: 90 92    Temp:      TempSrc:      Resp: 16 15 17 16   SpO2: 100% 100%     Blood pressure 211/115, pulse 92, temperature 97.2 F (36.2 C), temperature source Oral, resp. rate 16, SpO2 100.00%. PSYCH: She is alert and oriented x 0;  Unresponsive to verbal  HEENT: Normocephalic and Atraumatic, Mucous membranes pink;  Pupils symmetric and Pin-point and sluggish ,   EOM intact; Fundi:  Benign;  No scleral icterus, Nares: Patent, Oropharynx: Clear,  Neck:  FROM, no cervical lymphadenopathy nor thyromegaly or carotid bruit; no JVD; Breasts:: Not examined CHEST WALL: No tenderness CHEST: Normal respiration, clear to auscultation bilaterally HEART: Regular rate and rhythm; no murmurs rubs or gallops BACK: No kyphosis or scoliosis; no CVA tenderness ABDOMEN: Positive Bowel Sounds, Scaphoid, soft non-tender; no masses, no organomegaly, no pannus; no intertriginous candida. Rectal Exam: Not done EXTREMITIES: + Transmetatarsal amputation of the Left Foot,   RLE:  No cyanosis, clubbing or edema; no ulcerations. Genitalia: not examined PULSES: 2+ and symmetric SKIN: Normal hydration no rash or ulceration CNS:  Obtunded, (Post -Ictal)  ,   Pupils Pin-Point sluggish but reactive,  Unble to respond to verbal commands,  No facial droop, No posturing,    Vascular: pulses palpable throughout    Labs on Admission:  Basic Metabolic Panel:  Recent Labs Lab 01/31/14 2034 02/02/14 0006  NA 131* 132*  K 4.0 3.7  CL 95* 98  CO2 23 20  GLUCOSE 508* 669*  BUN 22 24*  CREATININE 1.97* 2.13*  CALCIUM 8.5 8.2*   Liver Function Tests:  Recent Labs Lab 02/02/14 0006  AST 27  ALT 25  ALKPHOS 87  BILITOT 0.2*  PROT 5.9*  ALBUMIN 1.6*   No results found for this basename: LIPASE, AMYLASE,  in the last 168 hours No results found for this basename: AMMONIA,  in the last 168 hours CBC:  Recent Labs Lab 01/31/14 2034  02/02/14 0006  WBC 6.2 4.5  NEUTROABS  --  3.3  HGB 12.1 10.4*  HCT 34.7* 30.6*  MCV 81.1 82.7  PLT 356 228   Cardiac Enzymes:  Recent Labs Lab 02/02/14 0006  TROPONINI <0.30    BNP (last 3 results)  Recent Labs  06/01/13 1619  PROBNP 1210.0*   CBG:  Recent Labs Lab 02/01/14 0044 02/01/14 2350 02/02/14 0158 02/02/14 0320  GLUCAP 371* >600* 465* 494*    Radiological Exams on Admission: Ct Head Wo Contrast  02/02/2014   CLINICAL DATA:  Seizure.  Altered mental status.  EXAM: CT  HEAD WITHOUT CONTRAST  TECHNIQUE: Contiguous axial images were obtained from the base of the skull through the vertex without intravenous contrast.  COMPARISON:  02/09/2013  FINDINGS: Skull and Sinuses:Negative for fracture or destructive process. The mastoids, middle ears, and imaged paranasal sinuses are clear.  Orbits: Bilateral cataract resection.  Brain: No evidence of acute abnormality, such as acute infarction, hemorrhage, hydrocephalus, or mass lesion/mass effect. There is chronic enlargement of the sylvian fissures bilaterally. On brain MRI from 2009 there was no definitive polymicrogyria in this region. There is a prominent medial left occipital lobe sulcus, also stable. Dilated perivascular space noted in the low left cerebrum.  IMPRESSION: 1. No acute intracranial findings. 2. Brain atrophy with a perisylvian predominance.   Electronically Signed   By: Jorje Guild M.D.   On: 02/02/2014 00:54      EKG: Independently reviewed. Normal Sinus Rhythm  Rate 96 , No acute changes.      Assessment/Plan:   48 y.o. female with  Principal Problem:   Seizures Active Problems:   DKA (diabetic ketoacidoses)   Hypertensive emergency   DM (diabetes mellitus), type 2, uncontrolled   Normocytic anemia   CKD (chronic kidney disease), stage III   Noncompliance    1.   Seizures-  IV Keppra to 1000mg   q 12 hrs, and PRN IV Ativan for seizures, neuro checks q 2 hours,.     2.   DKA- place on  the IV Insulin protocol, monitor Electrolytes , replete PRN.  NPO  3.   HTN Emergnecy--placed on a Cardene Drip to lower blood pressure to < 160.    4.   AKI with CKD stage III-   Monitor BUN/Cr.     5.   Noncompliance - may be cause of #1, #2 and #3.     6.  DVT prophylaxis with Lovenox.        Code Status:   FULL CODE Family Communication:    Son at Bedside Disposition Plan:    ICU admission     Time spent:  [place] [facility] Pager [phone]  If 7PM-7AM, please contact night-coverage [email or web address] Password [facility] 02/02/2014, 3:46 AM

## 2014-02-02 NOTE — ED Notes (Signed)
Critical Glucose 669 Called by Claiborne Billings Tibbitts (lab) R&A Sherron Monday, RN  MD notified

## 2014-02-02 NOTE — ED Notes (Signed)
2 unsuccessful IV attempts. R AC & L hand

## 2014-02-02 NOTE — Progress Notes (Signed)
CARE MANAGEMENT NOTE 02/02/2014  Patient:  [name], [name]   Account Number:  [id number]  Date Initiated:  02/02/2014  Documentation initiated by:  DAVIS,RHONDA  Subjective/Objective Assessment:   witness seizure at home, hyperglycemic, and hypertensive.     Action/Plan:   home when stable/pcp is Arbie Cookey webb,MD   Anticipated DC Date:  02/05/2014   Anticipated DC Plan:  [place]  In-house referral  NA      DC Planning Services  CM consult      [facility] Choice  NA   Choice offered to / List presented to:  NA   DME arranged  NA      DME agency  NA  NA     Price arranged  NA      [place] agency  NA   Status of service:  In process, will continue to follow Medicare Important Message given?  NA - LOS <3 / Initial given by admissions (If response is "NO", the following Medicare IM given date fields will be blank) Date Medicare IM given:   Medicare IM given by:   Date Additional Medicare IM given:   Additional Medicare IM given by:    Discharge Disposition:    Per UR Regulation:  Reviewed for med. necessity/level of care/duration of stay  If discussed at [place] of Stay Meetings, dates discussed:    Comments:  [id number]/Rhonda Eldridge Dace, [place], [place] [phone] Chart Reviewed for discharge and hospital needs. Discharge needs at time of review: None present will follow for needs. Review of patient progress due on [id number].

## 2014-02-02 NOTE — ED Provider Notes (Signed)
Medical screening examination/treatment/procedure(s) were conducted as a shared visit with non-physician practitioner(s) and myself.  I personally evaluated the patient during the encounter.   EKG Interpretation None      I interviewed and examined the patient. Lungs are CTAB. Cardiac exam wnl. Abdomen soft.  Pt well appearing. Has chronic diffuse rash w/out evidence of acute infection on my exam. BP elevated but pt has not taken her evening meds. Will give those now. BS elevated, but pt states her BS runs in the 300-400's typically. No anion gap. Will give IVF and insulin to dec BS.  I think she is appropriate for outpt tx. Will rec f/u w/ derm.   Blanchard Kelch, MD 02/02/14 1331

## 2014-02-02 NOTE — Progress Notes (Signed)
I have seen and assessed patient, and agree with Dr. Adline Mango assessment and plan. Patient presented with seizures, probable hyposmolar hyperglycemia and hypertensive urgency. Patient currently lethargic opens eyes to verbal stimuli however, goes back to sleep. CT head was negative. Patient currently on glucose stabilizer. Patient on Cardene drip. Discontinue Cardizem drip. Check a magnesium level. Check MRI of the head. Will consult with neurology for further evaluation and management. Concern for possible medical noncompliance.

## 2014-02-02 NOTE — ED Notes (Signed)
Pt CBG greater than 600

## 2014-02-02 NOTE — ED Notes (Signed)
Insulin gtt verified with Denny Levy, RN

## 2014-02-03 DIAGNOSIS — N189 Chronic kidney disease, unspecified: Secondary | ICD-10-CM

## 2014-02-03 DIAGNOSIS — E1100 Type 2 diabetes mellitus with hyperosmolarity without nonketotic hyperglycemic-hyperosmolar coma (NKHHC): Secondary | ICD-10-CM | POA: Diagnosis present

## 2014-02-03 DIAGNOSIS — N179 Acute kidney failure, unspecified: Secondary | ICD-10-CM | POA: Diagnosis present

## 2014-02-03 LAB — CBC WITH DIFFERENTIAL/PLATELET
BASOS PCT: 0 % (ref 0–1)
Basophils Absolute: 0 10*3/uL (ref 0.0–0.1)
EOS ABS: 0.1 10*3/uL (ref 0.0–0.7)
Eosinophils Relative: 2 % (ref 0–5)
HEMATOCRIT: 28.1 % — AB (ref 36.0–46.0)
HEMOGLOBIN: 9.5 g/dL — AB (ref 12.0–15.0)
Lymphocytes Relative: 42 % (ref 12–46)
Lymphs Abs: 2 10*3/uL (ref 0.7–4.0)
MCH: 28.2 pg (ref 26.0–34.0)
MCHC: 33.8 g/dL (ref 30.0–36.0)
MCV: 83.4 fL (ref 78.0–100.0)
MONO ABS: 0.4 10*3/uL (ref 0.1–1.0)
Monocytes Relative: 7 % (ref 3–12)
NEUTROS PCT: 49 % (ref 43–77)
Neutro Abs: 2.4 10*3/uL (ref 1.7–7.7)
Platelets: 248 10*3/uL (ref 150–400)
RBC: 3.37 MIL/uL — ABNORMAL LOW (ref 3.87–5.11)
RDW: 15 % (ref 11.5–15.5)
WBC: 4.8 10*3/uL (ref 4.0–10.5)

## 2014-02-03 LAB — FERRITIN: Ferritin: 24 ng/mL (ref 10–291)

## 2014-02-03 LAB — GLUCOSE, CAPILLARY
GLUCOSE-CAPILLARY: 311 mg/dL — AB (ref 70–99)
GLUCOSE-CAPILLARY: 362 mg/dL — AB (ref 70–99)
GLUCOSE-CAPILLARY: 388 mg/dL — AB (ref 70–99)
Glucose-Capillary: 74 mg/dL (ref 70–99)
Glucose-Capillary: 119 mg/dL — ABNORMAL HIGH (ref 70–99)

## 2014-02-03 LAB — BASIC METABOLIC PANEL
Anion gap: 9 (ref 5–15)
BUN: 25 mg/dL — ABNORMAL HIGH (ref 6–23)
CO2: 21 mEq/L (ref 19–32)
CREATININE: 2.23 mg/dL — AB (ref 0.50–1.10)
Calcium: 7.4 mg/dL — ABNORMAL LOW (ref 8.4–10.5)
Chloride: 104 mEq/L (ref 96–112)
GFR, EST AFRICAN AMERICAN: 29 mL/min — AB (ref >90)
GFR, EST NON AFRICAN AMERICAN: 25 mL/min — AB (ref >90)
Glucose, Bld: 410 mg/dL — ABNORMAL HIGH (ref 70–99)
Potassium: 3.9 mEq/L (ref 3.7–5.3)
Sodium: 134 mEq/L — ABNORMAL LOW (ref 137–147)

## 2014-02-03 LAB — IRON AND TIBC
Iron: 11 ug/dL — ABNORMAL LOW (ref 42–135)
SATURATION RATIOS: 4 % — AB (ref 20–55)
TIBC: 254 ug/dL (ref 250–470)
UIBC: 243 ug/dL (ref 125–400)

## 2014-02-03 LAB — CREATININE, URINE, RANDOM: Creatinine, Urine: 49.87 mg/dL

## 2014-02-03 LAB — URINE CULTURE
COLONY COUNT: NO GROWTH
CULTURE: NO GROWTH

## 2014-02-03 LAB — FOLATE: Folate: 19.3 ng/mL

## 2014-02-03 LAB — VITAMIN B12: Vitamin B-12: 1334 pg/mL — ABNORMAL HIGH (ref 211–911)

## 2014-02-03 MED ORDER — INSULIN ASPART 100 UNIT/ML ~~LOC~~ SOLN
0.0000 [IU] | Freq: Three times a day (TID) | SUBCUTANEOUS | Status: DC
Start: 2014-02-03 — End: 2014-02-03

## 2014-02-03 MED ORDER — INSULIN GLARGINE 100 UNIT/ML ~~LOC~~ SOLN
20.0000 [IU] | Freq: Every morning | SUBCUTANEOUS | Status: DC
  Administered 2014-02-03 – 2014-02-04 (×2): 20 [IU] via SUBCUTANEOUS
  Filled 2014-02-03 (×2): qty 0.2

## 2014-02-03 MED ORDER — ENALAPRILAT 1.25 MG/ML IV SOLN
1.2500 mg | Freq: Four times a day (QID) | INTRAVENOUS | Status: DC
  Filled 2014-02-03 (×4): qty 1

## 2014-02-03 MED ORDER — INSULIN ASPART 100 UNIT/ML ~~LOC~~ SOLN
4.0000 [IU] | Freq: Three times a day (TID) | SUBCUTANEOUS | Status: DC
  Administered 2014-02-04 (×3): 4 [IU] via SUBCUTANEOUS

## 2014-02-03 MED ORDER — INSULIN ASPART 100 UNIT/ML ~~LOC~~ SOLN: 6.0000 [IU] | Freq: Three times a day (TID) | SUBCUTANEOUS | Status: DC

## 2014-02-03 MED ORDER — LEVETIRACETAM 500 MG PO TABS
500.0000 mg | ORAL_TABLET | Freq: Two times a day (BID) | ORAL | Status: DC
  Administered 2014-02-03 – 2014-02-10 (×15): 500 mg via ORAL
  Filled 2014-02-03 (×16): qty 1

## 2014-02-03 MED ORDER — INSULIN ASPART 100 UNIT/ML ~~LOC~~ SOLN: 0.0000 [IU] | Freq: Every day | SUBCUTANEOUS | Status: DC

## 2014-02-03 MED ORDER — INSULIN ASPART 100 UNIT/ML ~~LOC~~ SOLN: 3.0000 [IU] | Freq: Once | SUBCUTANEOUS | Status: DC

## 2014-02-03 MED ORDER — AMLODIPINE BESYLATE 10 MG PO TABS
10.0000 mg | ORAL_TABLET | Freq: Every day | ORAL | Status: DC
  Administered 2014-02-03 – 2014-02-10 (×6): 10 mg via ORAL
  Filled 2014-02-03 (×8): qty 1

## 2014-02-03 MED ORDER — LISINOPRIL 10 MG PO TABS: 20.0000 mg | ORAL_TABLET | Freq: Every day | ORAL | Status: DC

## 2014-02-03 MED ORDER — INSULIN ASPART 100 UNIT/ML ~~LOC~~ SOLN
3.0000 [IU] | Freq: Three times a day (TID) | SUBCUTANEOUS | Status: DC
  Administered 2014-02-03 (×3): 3 [IU] via SUBCUTANEOUS

## 2014-02-03 MED ORDER — INSULIN ASPART 100 UNIT/ML ~~LOC~~ SOLN
0.0000 [IU] | Freq: Every day | SUBCUTANEOUS | Status: DC
  Administered 2014-02-03: 5 [IU] via SUBCUTANEOUS

## 2014-02-03 MED ORDER — FOLIC ACID 800 MCG PO TABS: 400.0000 ug | ORAL_TABLET | Freq: Every day | ORAL | Status: DC

## 2014-02-03 MED ORDER — PREGABALIN 75 MG PO CAPS
75.0000 mg | ORAL_CAPSULE | Freq: Two times a day (BID) | ORAL | Status: DC
  Administered 2014-02-03 – 2014-02-10 (×15): 75 mg via ORAL
  Filled 2014-02-03 (×15): qty 1

## 2014-02-03 MED ORDER — INSULIN ASPART 100 UNIT/ML ~~LOC~~ SOLN
0.0000 [IU] | Freq: Three times a day (TID) | SUBCUTANEOUS | Status: DC
  Administered 2014-02-03: 11 [IU] via SUBCUTANEOUS
  Administered 2014-02-03: 15 [IU] via SUBCUTANEOUS
  Administered 2014-02-04: 3 [IU] via SUBCUTANEOUS
  Administered 2014-02-04 (×2): 8 [IU] via SUBCUTANEOUS
  Administered 2014-02-05: 3 [IU] via SUBCUTANEOUS
  Administered 2014-02-05: 15 [IU] via SUBCUTANEOUS
  Administered 2014-02-05: 5 [IU] via SUBCUTANEOUS
  Administered 2014-02-06: 11 [IU] via SUBCUTANEOUS
  Administered 2014-02-06: 15 [IU] via SUBCUTANEOUS
  Administered 2014-02-06: 5 [IU] via SUBCUTANEOUS
  Administered 2014-02-07: 11 [IU] via SUBCUTANEOUS
  Administered 2014-02-07: 3 [IU] via SUBCUTANEOUS
  Administered 2014-02-08 (×3): 2 [IU] via SUBCUTANEOUS
  Administered 2014-02-09: 11 [IU] via SUBCUTANEOUS
  Administered 2014-02-09 (×2): 3 [IU] via SUBCUTANEOUS
  Administered 2014-02-10: 5 [IU] via SUBCUTANEOUS

## 2014-02-03 MED ORDER — INSULIN ASPART 100 UNIT/ML ~~LOC~~ SOLN: 0.0000 [IU] | Freq: Three times a day (TID) | SUBCUTANEOUS | Status: DC

## 2014-02-03 MED ORDER — FOLIC ACID 0.5 MG HALF TAB
0.5000 mg | ORAL_TABLET | Freq: Every day | ORAL | Status: DC
  Administered 2014-02-03 – 2014-02-10 (×8): 0.5 mg via ORAL
  Filled 2014-02-03 (×8): qty 1

## 2014-02-03 MED ORDER — ZOLPIDEM TARTRATE 5 MG PO TABS
5.0000 mg | ORAL_TABLET | Freq: Once | ORAL | Status: AC
  Administered 2014-02-03: 5 mg via ORAL
  Filled 2014-02-03: qty 1

## 2014-02-03 MED ORDER — CARVEDILOL 25 MG PO TABS
25.0000 mg | ORAL_TABLET | Freq: Two times a day (BID) | ORAL | Status: DC
Start: 2014-02-03 — End: 2014-02-10
  Administered 2014-02-03 – 2014-02-10 (×15): 25 mg via ORAL
  Filled 2014-02-03 (×5): qty 1
  Filled 2014-02-03: qty 2
  Filled 2014-02-03 (×11): qty 1

## 2014-02-03 MED ORDER — K PHOS MONO-SOD PHOS DI & MONO 155-852-130 MG PO TABS
500.0000 mg | ORAL_TABLET | Freq: Three times a day (TID) | ORAL | Status: DC
  Administered 2014-02-03 – 2014-02-04 (×4): 500 mg via ORAL
  Filled 2014-02-03 (×7): qty 2

## 2014-02-03 MED ORDER — HYDRALAZINE HCL 20 MG/ML IJ SOLN
10.0000 mg | Freq: Four times a day (QID) | INTRAMUSCULAR | Status: DC | PRN
  Administered 2014-02-05: 10 mg via INTRAVENOUS
  Filled 2014-02-03: qty 0.5

## 2014-02-03 NOTE — Progress Notes (Signed)
You may reach pt's daughter, Brennyn Guarino, at [phone].

## 2014-02-03 NOTE — Progress Notes (Signed)
While giving patient medicines, pt began shaking her arms and stated she "felt a seizure" Pt is confused and having mild hallucinations. RN gave ativan to patient to help her since it was indeterminate if the patient was seizing or just confused and said she was.  Sitter and RN to monitor.

## 2014-02-03 NOTE — Progress Notes (Signed)
Subjective: No further seizures. Patient states this scared her and she will be taking her medications from her on in.   Objective: Current vital signs: BP 169/93  Pulse 90  Temp(Src) 98 F (36.7 C) (Oral)  Resp 18  Ht 5\' 6"  (1.676 m)  Wt 77.4 kg (170 lb 10.2 oz)  BMI 27.55 kg/m2  SpO2 100% Vital signs in last 24 hours: Temp:  [97.8 F (36.6 C)-98.4 F (36.9 C)] 98 F (36.7 C) (07/02 0800) Pulse Rate:  [39-109] 90 (07/02 0800) Resp:  [0-24] 18 (07/02 0800) BP: (87-226)/(54-163) 169/93 mmHg (07/02 0800) SpO2:  [84 %-100 %] 100 % (07/02 0800) Weight:  [77.4 kg (170 lb 10.2 oz)] 77.4 kg (170 lb 10.2 oz) (07/02 0400)  Intake/Output from previous day: 07/01 0701 - 07/02 0700 In: 5422.3 [P.O.:1680; I.V.:3337.3; IV Piggyback:405] Out: 2100 [Urine:2100] Intake/Output this shift:   Nutritional status: Carb Control  Neurologic Exam: Mental Status: Alert, oriented, thought content appropriate.  Speech fluent without evidence of aphasia.  Able to follow 3 step commands without difficulty. Cranial Nerves: II: Discs flat bilaterally; Visual fields grossly normal, pupils equal, round, reactive to light and accommodation III,IV, VI: ptosis not present, extra-ocular motions intact bilaterally V,VII: smile symmetric, facial light touch sensation normal bilaterally VIII: hearing normal bilaterally IX,X: gag reflex present XI: bilateral shoulder shrug XII: midline tongue extension without atrophy or fasciculations  Motor: Right : Upper extremity   5/5    Left:     Upper extremity   5/5  Lower extremity   5/5     Lower extremity   5/5 Tone and bulk:normal tone throughout; no atrophy noted Sensory: Pinprick and light touch intact throughout, bilaterally Deep Tendon Reflexes:  Right: Upper Extremity   Left: Upper extremity   biceps (C-5 to C-6) 2/4   biceps (C-5 to C-6) 2/4 tricep (C7) 2/4    triceps (C7) 2/4 Brachioradialis (C6) 2/4  Brachioradialis (C6) 2/4  Lower Extremity Lower  Extremity  quadriceps (L-2 to L-4) 2/4   quadriceps (L-2 to L-4) 2/4 Achilles (S1) 0/4   Achilles (S1) 0/4  Plantars: Right: downgoing   Left: amputation of toes Cerebellar: normal finger-to-nose,  normal heel-to-shin test    Lab Results: Basic Metabolic Panel:  Recent Labs Lab 02/02/14 0630 02/02/14 0818 02/02/14 1050 02/02/14 1609 02/03/14 0400  NA 137 139 138 138 134*  K 3.2* 3.3* 3.3* 3.2* 3.9  CL 100 104 103 102 104  CO2 22 23 21 22 21   GLUCOSE 376* 271* 164* 159* 410*  BUN 23 24* 23 22 25*  CREATININE 2.17* 2.21* 2.23* 2.11* 2.23*  CALCIUM 8.5 8.5 8.5 8.5 7.4*  MG  --  1.9  --   --   --   PHOS  --  2.1*  --   --   --     Liver Function Tests:  Recent Labs Lab 02/02/14 0006  AST 27  ALT 25  ALKPHOS 87  BILITOT 0.2*  PROT 5.9*  ALBUMIN 1.6*   No results found for this basename: LIPASE, AMYLASE,  in the last 168 hours  Recent Labs Lab 02/02/14 0432  AMMONIA 39    CBC:  Recent Labs Lab 01/31/14 2034 02/02/14 0006 02/02/14 0630 02/03/14 0400  WBC 6.2 4.5 6.2 4.8  NEUTROABS  --  3.3  --  2.4  HGB 12.1 10.4* 11.0* 9.5*  HCT 34.7* 30.6* 32.1* 28.1*  MCV 81.1 82.7 82.7 83.4  PLT 356 228 264 248    Cardiac Enzymes:  Recent Labs Lab 02/02/14 0006  TROPONINI <0.30    Lipid Panel: No results found for this basename: CHOL, TRIG, HDL, CHOLHDL, VLDL, LDLCALC,  in the last 168 hours  CBG:  Recent Labs Lab 02/02/14 1529 02/02/14 1645 02/02/14 2115 02/03/14 0024 02/03/14 0805  GLUCAP 141* 169* 267* 388* 58*    Microbiology: Results for orders placed during the hospital encounter of 02/01/14  MRSA PCR SCREENING     Status: None   Collection Time    02/02/14  5:29 AM      Result Value Ref Range Status   MRSA by PCR NEGATIVE  NEGATIVE Final   Comment:            The GeneXpert MRSA Assay (FDA     approved for NASAL specimens     only), is one component of a     comprehensive MRSA colonization     surveillance program. It is not      intended to diagnose MRSA     infection nor to guide or     monitor treatment for     MRSA infections.     Performed at [facility]    Coagulation Studies: No results found for this basename: LABPROT, INR,  in the last 72 hours  Imaging: Ct Head Wo Contrast  02/02/2014   CLINICAL DATA:  Seizure.  Altered mental status.  EXAM: CT HEAD WITHOUT CONTRAST  TECHNIQUE: Contiguous axial images were obtained from the base of the skull through the vertex without intravenous contrast.  COMPARISON:  02/09/2013  FINDINGS: Skull and Sinuses:Negative for fracture or destructive process. The mastoids, middle ears, and imaged paranasal sinuses are clear.  Orbits: Bilateral cataract resection.  Brain: No evidence of acute abnormality, such as acute infarction, hemorrhage, hydrocephalus, or mass lesion/mass effect. There is chronic enlargement of the sylvian fissures bilaterally. On brain MRI from 2009 there was no definitive polymicrogyria in this region. There is a prominent medial left occipital lobe sulcus, also stable. Dilated perivascular space noted in the low left cerebrum.  IMPRESSION: 1. No acute intracranial findings. 2. Brain atrophy with a perisylvian predominance.   Electronically Signed   By: Jorje Guild M.D.   On: 02/02/2014 00:54   Mr Brain Wo Contrast  02/02/2014   CLINICAL DATA:  Seizure.  Altered mental status.  EXAM: MRI HEAD WITHOUT CONTRAST  TECHNIQUE: Multiplanar, multiecho pulse sequences of the brain and surrounding structures were obtained without intravenous contrast.  COMPARISON:  Head CT 02/02/2014 and MRI 02/13/2008  FINDINGS: Dedicated thin section imaging through the temporal lobes demonstrates normal volume and signal of the hippocampi. There is no evidence of heterotopia.  There is no evidence of acute infarct, intracranial hemorrhage, mass, midline shift, or extra-axial fluid collection. Symmetric, temporoparietal predominant atrophy with enlargement of the sylvian  fissures is again seen and slightly progressed from the prior MRI. Foci of T2 hyperintensity in the periventricular white matter and pons are similar to the prior MRI and nonspecific but compatible with mild chronic small vessel ischemic disease. Dilated perivascular space at the inferior left basal ganglia is unchanged.  Prior bilateral cataract extraction is noted. Major intracranial vascular flow voids are preserved. Paranasal sinuses and mastoid air cells are clear.  IMPRESSION: 1. No evidence of acute intracranial abnormality or mass. 2. Cerebral atrophy as above, mildly progressed from prior MRI.   Electronically Signed   By: Logan Bores   On: 02/02/2014 20:55   US Renal  02/02/2014   CLINICAL DATA:  Acute renal failure.  EXAM: RENAL/URINARY TRACT ULTRASOUND COMPLETE  COMPARISON:  None.  FINDINGS: Right Kidney:  Length: 11.5 cm. Renal parenchyma is slightly echogenic suggesting medical renal disease. No mass or hydronephrosis visualized.  Left Kidney:  Length: 10.6 cm. Renal parenchyma is slightly echogenic suggesting medical renal disease. No mass or hydronephrosis visualized.  Bladder:  Is fully distended, with echogenic material seen in the base of the bladder. No ureteral jets are noted.  IMPRESSION: Renal parenchyma is slightly echogenic bilaterally suggesting medical renal disease. No hydronephrosis or renal obstruction is noted.   Electronically Signed   By: Sabino Dick M.D.   On: 02/02/2014 14:51   Dg Chest Port 1 View  02/02/2014   CLINICAL DATA:  Hyperglycemia  EXAM: PORTABLE CHEST - 1 VIEW  COMPARISON:  06/01/2013  FINDINGS: Cardiac enlargement without heart failure. Mild left lower lobe atelectasis. Negative for pneumonia or effusion.  IMPRESSION: Mild left lower lobe atelectasis.   Electronically Signed   By: Franchot Gallo M.D.   On: 02/02/2014 10:33    Medications:  Scheduled: . amLODipine  10 mg Oral Daily  . carvedilol  25 mg Oral BID WC  . enoxaparin (LOVENOX) injection  30 mg  Subcutaneous Q24H  . insulin aspart  0-15 Units Subcutaneous TID WC  . insulin aspart  3 Units Subcutaneous TID WC  . insulin glargine  20 Units Subcutaneous q morning - 10a  . levETIRAcetam  500 mg Oral BID  . phosphorus  500 mg Oral TID  . sodium chloride  3 mL Intravenous Q12H    Assessment/Plan: 48 YO female with known seizure disorder well controlled on Keppra 500 mg BID per son. She presents with break through seizure in setting of hypertensive emergency, Glucose of 669, and AED noncompliance.   Recommend: Continue Keppra 500 mg BID.  No further recommendations.     No further neurologic intervention is recommended at this time.  If further questions arise, please call or page at that time.  Thank you for allowing neurology to participate in the care of this patient.  Etta Quill PA-C [facility] [phone]  02/03/2014, 9:37 AM

## 2014-02-03 NOTE — Progress Notes (Signed)
Inpatient Diabetes Program Recommendations  AACE/ADA: New Consensus Statement on Inpatient Glycemic Control (2013)  Target Ranges:  Prepandial:   less than 140 mg/dL      Peak postprandial:   less than 180 mg/dL (1-2 hours)      Critically ill patients:  140 - 180 mg/dL   Reason for Visit: Hyperglycemia  Results for GAO, BLAUER (MRN [id number]) as of 02/03/2014 16:00  Ref. Range 02/02/2014 21:15 02/03/2014 00:24 02/03/2014 08:05 02/03/2014 11:28  Glucose-Capillary Latest Range: 70-99 mg/dL 267 (H) 388 (H) 362 (H) 311 (H)    Inpatient Diabetes Program Recommendations Insulin - Meal Coverage: Increase Novolog to 6 units tidwc for meal coverage insulin if pt eats ?50% meal HgbA1C: 12.4% - uncontrolled May need increase in Lantus if FBS >180 mg/dL in am.  Note: Will continue to follow. Thank you. Lorenda Peck, RD, LDN, CDE Inpatient Diabetes Coordinator [phone]

## 2014-02-03 NOTE — Progress Notes (Signed)
PT Cancellation Note  Patient Details Name: HAWRAA KOBZA MRN: [id number] DOB: 06/10/1966   Cancelled Treatment:    Reason Eval/Treat Not Completed: Other (comment) (Per RN pt has been very disoriented and agitated this morning and just went to sleep. RN requested PT not wake her at this time.  Will follow. )   Philomena Doheny 02/03/2014, 1:05 PM [phone]

## 2014-02-03 NOTE — Progress Notes (Addendum)
[facility] PROGRESS NOTE  [name] [id number] DOB: 31-May-1966 DOA: 02/01/2014 PCP: Maurice Small, D, MD  Assessment/Plan: #1 breakthrough seizures Likely secondary to noncompliance. Patient stated had not been on the [place] for over a year. No seizures overnight. CT head negative. MRI of the head with no acute abnormalities. No signs or symptoms of infection. Phosphorus level is slightly low wIll replete. Magnesium level was at 1.9. Will change IV Keppra to oral Keppra. PT/OT. Neurology following and appreciate input and recommendations.  #2 uncontrolled type 2 diabetes with hyperosmolar hyperglycemia Likely secondary to medical noncompliance. Hemoglobin A1c is 12.4 which is an increase from 9.2 last year 02/09/2013. Patient currently off the glucometer. CBGs have ranged from 125 - 388. Lantus has been increased to 20 units daily. Will change sensitive sliding scale insulin to moderate sliding scale insulin. Diabetes education. Patient is interested in outpatient diabetes education. Follow.  #3 hypertensive emergency Likely also secondary to medical noncompliance. Patient currently off the Cardene drip. We'll place on Coreg 25 mg twice daily. Also start Norvasc 10 mg daily. Monitor and titrate medications as needed.  #4 acute on chronic kidney disease stage III Likely secondary to a prerenal azotemia. Renal ultrasound negative for hydronephrosis. Patient would good urinary output of 2100 cc over the past 24 hours. Will increase IV fluids to normal saline 100 cc per hour. Follow.  #5 hypophosphatemia Replete.  #6 anemia Likely anemia of chronic disease secondary to chronic kidney disease. No overt GI bleed. Anemia panel is pending. Follow H&H. Transfusion threshold hemoglobin less than 7.  #7 prophylaxis Lovenox for DVT prophylaxis.    Code Status: Full Family Communication: Updated patient no family at bedside. Disposition Plan: Transfer to  telemetry   Consultants:  Neurology: Dr. Doy Mince 02/02/2014  Procedures:  MRI head 02/02/2014  CT head 02/02/2014  Chest x-ray 02/02/2014  Renal ultrasound 02/02/2014  Antibiotics:  None  HPI/Subjective: No syncopal episodes overnight per nursing. Patient alert and following commands coherent.  Objective: Filed Vitals:   02/03/14 0800  BP: 169/93  Pulse: 90  Temp:   Resp: 18    Intake/Output Summary (Last 24 hours) at 02/03/14 0840 Last data filed at 02/03/14 0600  Gross per 24 hour  Intake 5383.99 ml  Output   2100 ml  Net 3283.99 ml   Filed Weights   02/02/14 0510 02/03/14 0400  Weight: 73.9 kg (162 lb 14.7 oz) 77.4 kg (170 lb 10.2 oz)    Exam:   General:  NAD  Cardiovascular: RRR  Respiratory: CTAB  Abdomen: Soft, nontender, nondistended, positive bowel sounds.  Musculoskeletal: No clubbing cyanosis or edema. Status post left foot transmetatarsal amputation.  Data Reviewed: Basic Metabolic Panel:  Recent Labs Lab 02/02/14 0630 02/02/14 0818 02/02/14 1050 02/02/14 1609 02/03/14 0400  NA 137 139 138 138 134*  K 3.2* 3.3* 3.3* 3.2* 3.9  CL 100 104 103 102 104  CO2 22 23 21 22 21   GLUCOSE 376* 271* 164* 159* 410*  BUN 23 24* 23 22 25*  CREATININE 2.17* 2.21* 2.23* 2.11* 2.23*  CALCIUM 8.5 8.5 8.5 8.5 7.4*  MG  --  1.9  --   --   --   PHOS  --  2.1*  --   --   --    Liver Function Tests:  Recent Labs Lab 02/02/14 0006  AST 27  ALT 25  ALKPHOS 87  BILITOT 0.2*  PROT 5.9*  ALBUMIN 1.6*   No results found for this basename: LIPASE, AMYLASE,  in the last 168 hours  Recent Labs Lab 02/02/14 0432  AMMONIA 39   CBC:  Recent Labs Lab 01/31/14 2034 02/02/14 0006 02/02/14 0630 02/03/14 0400  WBC 6.2 4.5 6.2 4.8  NEUTROABS  --  3.3  --  2.4  HGB 12.1 10.4* 11.0* 9.5*  HCT 34.7* 30.6* 32.1* 28.1*  MCV 81.1 82.7 82.7 83.4  PLT 356 228 264 248   Cardiac Enzymes:  Recent Labs Lab 02/02/14 0006  TROPONINI <0.30    BNP (last 3 results)  Recent Labs  06/01/13 1619  PROBNP 1210.0*   CBG:  Recent Labs Lab 02/02/14 1339 02/02/14 1529 02/02/14 1645 02/02/14 2115 02/03/14 0024  GLUCAP 125* 141* 169* 267* 388*    Recent Results (from the past 240 hour(s))  MRSA PCR SCREENING     Status: None   Collection Time    02/02/14  5:29 AM      Result Value Ref Range Status   MRSA by PCR NEGATIVE  NEGATIVE Final   Comment:            The GeneXpert MRSA Assay (FDA     approved for NASAL specimens     only), is one component of a     comprehensive MRSA colonization     surveillance program. It is not     intended to diagnose MRSA     infection nor to guide or     monitor treatment for     MRSA infections.     Performed at [facility]     Studies: Ct Head Wo Contrast  02/02/2014   CLINICAL DATA:  Seizure.  Altered mental status.  EXAM: CT HEAD WITHOUT CONTRAST  TECHNIQUE: Contiguous axial images were obtained from the base of the skull through the vertex without intravenous contrast.  COMPARISON:  02/09/2013  FINDINGS: Skull and Sinuses:Negative for fracture or destructive process. The mastoids, middle ears, and imaged paranasal sinuses are clear.  Orbits: Bilateral cataract resection.  Brain: No evidence of acute abnormality, such as acute infarction, hemorrhage, hydrocephalus, or mass lesion/mass effect. There is chronic enlargement of the sylvian fissures bilaterally. On brain MRI from 2009 there was no definitive polymicrogyria in this region. There is a prominent medial left occipital lobe sulcus, also stable. Dilated perivascular space noted in the low left cerebrum.  IMPRESSION: 1. No acute intracranial findings. 2. Brain atrophy with a perisylvian predominance.   Electronically Signed   By: Jorje Guild M.D.   On: 02/02/2014 00:54   Mr Brain Wo Contrast  02/02/2014   CLINICAL DATA:  Seizure.  Altered mental status.  EXAM: MRI HEAD WITHOUT CONTRAST  TECHNIQUE: Multiplanar, multiecho  pulse sequences of the brain and surrounding structures were obtained without intravenous contrast.  COMPARISON:  Head CT 02/02/2014 and MRI 02/13/2008  FINDINGS: Dedicated thin section imaging through the temporal lobes demonstrates normal volume and signal of the hippocampi. There is no evidence of heterotopia.  There is no evidence of acute infarct, intracranial hemorrhage, mass, midline shift, or extra-axial fluid collection. Symmetric, temporoparietal predominant atrophy with enlargement of the sylvian fissures is again seen and slightly progressed from the prior MRI. Foci of T2 hyperintensity in the periventricular white matter and pons are similar to the prior MRI and nonspecific but compatible with mild chronic small vessel ischemic disease. Dilated perivascular space at the inferior left basal ganglia is unchanged.  Prior bilateral cataract extraction is noted. Major intracranial vascular flow voids are preserved. Paranasal sinuses and mastoid air cells are  clear.  IMPRESSION: 1. No evidence of acute intracranial abnormality or mass. 2. Cerebral atrophy as above, mildly progressed from prior MRI.   Electronically Signed   By: Logan Bores   On: 02/02/2014 20:55   US Renal  02/02/2014   CLINICAL DATA:  Acute renal failure.  EXAM: RENAL/URINARY TRACT ULTRASOUND COMPLETE  COMPARISON:  None.  FINDINGS: Right Kidney:  Length: 11.5 cm. Renal parenchyma is slightly echogenic suggesting medical renal disease. No mass or hydronephrosis visualized.  Left Kidney:  Length: 10.6 cm. Renal parenchyma is slightly echogenic suggesting medical renal disease. No mass or hydronephrosis visualized.  Bladder:  Is fully distended, with echogenic material seen in the base of the bladder. No ureteral jets are noted.  IMPRESSION: Renal parenchyma is slightly echogenic bilaterally suggesting medical renal disease. No hydronephrosis or renal obstruction is noted.   Electronically Signed   By: Sabino Dick M.D.   On: 02/02/2014  14:51   Dg Chest Port 1 View  02/02/2014   CLINICAL DATA:  Hyperglycemia  EXAM: PORTABLE CHEST - 1 VIEW  COMPARISON:  06/01/2013  FINDINGS: Cardiac enlargement without heart failure. Mild left lower lobe atelectasis. Negative for pneumonia or effusion.  IMPRESSION: Mild left lower lobe atelectasis.   Electronically Signed   By: Franchot Gallo M.D.   On: 02/02/2014 10:33    Scheduled Meds: . carvedilol  25 mg Oral BID WC  . enoxaparin (LOVENOX) injection  30 mg Subcutaneous Q24H  . insulin aspart  0-15 Units Subcutaneous TID WC  . insulin aspart  3 Units Subcutaneous TID WC  . insulin glargine  20 Units Subcutaneous q morning - 10a  . levETIRAcetam  500 mg Oral BID  . lisinopril  20 mg Oral Daily  . phosphorus  500 mg Oral TID  . sodium chloride  3 mL Intravenous Q12H   Continuous Infusions: . sodium chloride 75 mL/hr at 02/02/14 2018  . niCARDipine Stopped (02/03/14 0151)    Principal Problem:   Seizures Active Problems:   Hypertensive emergency   Uncontrolled type 2 diabetes mellitus with hyperosmolar nonketotic hyperglycemia   Renal failure (ARF), acute on chronic   DM (diabetes mellitus), type 2, uncontrolled   Hypertension, accelerated   Normocytic anemia   CKD (chronic kidney disease), stage III   Noncompliance    Time spent: Destin MD [facility] Pager [phone]. If 7PM-7AM, please contact night-coverage at [email or web address], password [facility] 02/03/2014, 8:40 AM  LOS: 2 days

## 2014-02-04 LAB — CBC
HCT: 27.4 % — ABNORMAL LOW (ref 36.0–46.0)
HEMOGLOBIN: 9.3 g/dL — AB (ref 12.0–15.0)
MCH: 28.4 pg (ref 26.0–34.0)
MCHC: 33.9 g/dL (ref 30.0–36.0)
MCV: 83.8 fL (ref 78.0–100.0)
Platelets: 221 10*3/uL (ref 150–400)
RBC: 3.27 MIL/uL — ABNORMAL LOW (ref 3.87–5.11)
RDW: 14.9 % (ref 11.5–15.5)
WBC: 3.4 10*3/uL — ABNORMAL LOW (ref 4.0–10.5)

## 2014-02-04 LAB — BASIC METABOLIC PANEL
ANION GAP: 9 (ref 5–15)
BUN: 22 mg/dL (ref 6–23)
CALCIUM: 7.1 mg/dL — AB (ref 8.4–10.5)
CHLORIDE: 107 meq/L (ref 96–112)
CO2: 20 mEq/L (ref 19–32)
Creatinine, Ser: 1.95 mg/dL — ABNORMAL HIGH (ref 0.50–1.10)
GFR calc non Af Amer: 29 mL/min — ABNORMAL LOW (ref >90)
GFR, EST AFRICAN AMERICAN: 34 mL/min — AB (ref >90)
Glucose, Bld: 253 mg/dL — ABNORMAL HIGH (ref 70–99)
Potassium: 4 mEq/L (ref 3.7–5.3)
Sodium: 136 mEq/L — ABNORMAL LOW (ref 137–147)

## 2014-02-04 LAB — GLUCOSE, CAPILLARY
GLUCOSE-CAPILLARY: 263 mg/dL — AB (ref 70–99)
GLUCOSE-CAPILLARY: 177 mg/dL — AB (ref 70–99)
GLUCOSE-CAPILLARY: 289 mg/dL — AB (ref 70–99)
Glucose-Capillary: 261 mg/dL — ABNORMAL HIGH (ref 70–99)

## 2014-02-04 LAB — PHOSPHORUS: PHOSPHORUS: 3.6 mg/dL (ref 2.3–4.6)

## 2014-02-04 LAB — MAGNESIUM: MAGNESIUM: 1.6 mg/dL (ref 1.5–2.5)

## 2014-02-04 MED ORDER — POLYSACCHARIDE IRON COMPLEX 150 MG PO CAPS
150.0000 mg | ORAL_CAPSULE | Freq: Every day | ORAL | Status: DC
  Administered 2014-02-04 – 2014-02-10 (×7): 150 mg via ORAL
  Filled 2014-02-04 (×7): qty 1

## 2014-02-04 MED ORDER — ENOXAPARIN SODIUM 40 MG/0.4ML ~~LOC~~ SOLN
40.0000 mg | SUBCUTANEOUS | Status: DC
  Administered 2014-02-04 – 2014-02-10 (×7): 40 mg via SUBCUTANEOUS
  Filled 2014-02-04 (×7): qty 0.4

## 2014-02-04 MED ORDER — MAGNESIUM SULFATE 50 % IJ SOLN
3.0000 g | Freq: Once | INTRAVENOUS | Status: AC
  Administered 2014-02-04: 3 g via INTRAVENOUS
  Filled 2014-02-04: qty 6

## 2014-02-04 MED ORDER — INSULIN GLARGINE 100 UNIT/ML ~~LOC~~ SOLN
25.0000 [IU] | Freq: Every morning | SUBCUTANEOUS | Status: DC
  Filled 2014-02-04: qty 0.25

## 2014-02-04 MED ORDER — INSULIN GLARGINE 100 UNIT/ML ~~LOC~~ SOLN
5.0000 [IU] | Freq: Once | SUBCUTANEOUS | Status: AC
  Administered 2014-02-04: 5 [IU] via SUBCUTANEOUS
  Filled 2014-02-04: qty 0.05

## 2014-02-04 MED ORDER — INSULIN ASPART 100 UNIT/ML ~~LOC~~ SOLN
6.0000 [IU] | Freq: Three times a day (TID) | SUBCUTANEOUS | Status: DC
  Administered 2014-02-05 – 2014-02-10 (×16): 6 [IU] via SUBCUTANEOUS

## 2014-02-04 NOTE — Evaluation (Signed)
Occupational Therapy Evaluation Patient Details Name: [name] MRN: [id number] DOB: 1965/11/09 Today's Date: 02/04/2014    History of Present Illness 48 yo female admitted with seizure, HTN, hyperglycemia. hx of L midfoot amputation 2014, seizure, neuropathy, bipolar d/o, glaucoma, DM, HTN, Hep C   Clinical Impression   Pt was admitted for seizure.  She lives with daughter and baseline of ADLs is unknown.  Pt is overall min A at this time but she demonstrates decreased balance and safety and is a high falls risk.  She will benefit from skilled OT to increase safety and independence with adls.  Goals are set for min guard level.      Follow Up Recommendations  Home health OT;Supervision/Assistance - 24 hour    Equipment Recommendations  None recommended by OT (HHOT can assess shower/DME)    Recommendations for Other Services       Precautions / Restrictions Precautions Precautions: Fall Precaution Comments: Seziure Restrictions Weight Bearing Restrictions: No      Mobility Bed Mobility Overal bed mobility: Needs Assistance Bed Mobility: Supine to Sit;Sit to Supine     Supine to sit: Min assist Sit to supine: Supervision   General bed mobility comments: pt sleepy at beginning of session; min A to get her started  Transfers Overall transfer level: Needs assistance   Transfers: Sit to/from Stand Sit to Stand: Min assist         General transfer comment: Assist to rise, stabilize, control descent. VCS safety.     Balance Overall balance assessment: Needs assistance         Standing balance support: Bilateral upper extremity supported Standing balance-Leahy Scale: Poor                              ADL Overall ADL's : Needs assistance/impaired Eating/Feeding: Supervision/ safety;Sitting (takes very large bits)   Grooming: Supervision/safety;Sitting   Upper Body Bathing: Sitting;Supervision/ safety   Lower Body Bathing: Minimal  assistance;Sit to/from stand   Upper Body Dressing : Supervision/safety;Sitting   Lower Body Dressing: Sit to/from stand;Moderate assistance   Toilet Transfer: Minimal assistance;BSC;Stand-pivot   Toileting- Clothing Manipulation and Hygiene: Minimal assistance;Sit to/from stand         General ADL Comments: Pt assisted with donning sock.  Pt lost balance with hygiene when standing after using commode:  needed assist to recover.  Cued for safety     Vision                     Perception     Praxis      Pertinent Vitals/Pain No c/o pain     Hand Dominance     Extremity/Trunk Assessment Upper Extremity Assessment Upper Extremity Assessment: Overall WFL for tasks assessed   Lower Extremity Assessment Lower Extremity Assessment: Generalized weakness   Cervical / Trunk Assessment Cervical / Trunk Assessment: Normal   Communication Communication Communication: No difficulties   Cognition Arousal/Alertness: Awake/alert Behavior During Therapy: WFL for tasks assessed/performed Overall Cognitive Status: No family/caregiver present to determine baseline cognitive functioning Area of Impairment: Attention;Safety/judgement;Problem solving   Current Attention Level: Selective Memory: Decreased recall of precautions   Safety/Judgement: Decreased awareness of safety   Problem Solving: Requires tactile cues;Requires verbal cues     General Comments       Exercises       Shoulder Instructions      Home Living Family/patient expects to be discharged to:: Private residence  Living Arrangements: Children   Type of Home: House Home Access: Level entry     Home Layout: One level               Home Equipment: Bedside commode   Additional Comments: states son is moving out      Prior Functioning/Environment Level of Independence: Independent        Comments: unsure of ADL PLOF; lives with daughter    OT Diagnosis: Generalized weakness   OT  Problem List: Decreased strength;Decreased activity tolerance;Impaired balance (sitting and/or standing);Decreased safety awareness;Decreased cognition   OT Treatment/Interventions: Self-care/ADL training;DME and/or AE instruction;Balance training;Patient/family education;Cognitive remediation/compensation    OT Goals(Current goals can be found in the care plan section) Acute Rehab OT Goals Patient Stated Goal: to get stronger OT Goal Formulation: With patient Time For Goal Achievement: 02/18/14 Potential to Achieve Goals: Good ADL Goals Pt Will Perform Lower Body Bathing: sit to/from stand;with min guard assist Pt Will Perform Lower Body Dressing: sit to/from stand;with min guard assist Pt Will Transfer to Toilet: with min guard assist;ambulating;bedside commode (all aspects) Additional ADL Goal #1: pt will keep one hand on walker during clothing management/hygiene after initial cue for increased safety  OT Frequency: Min 2X/week   Barriers to D/C:            Co-evaluation              End of Session    Activity Tolerance: Patient tolerated treatment well Patient left: in bed;with call [employer]/phone within reach;with nursing/sitter in room   Time: [id number] OT Time Calculation (min): 19 min Charges:  OT General Charges $OT Visit: 1 Procedure OT Evaluation $Initial OT Evaluation Tier I: 1 Procedure OT Treatments $Self Care/Home Management : 8-22 mins G-Codes:    [name] 02-18-2014, 12:18 PM  Lesle Chris, OTR/L [phone] 02/18/2014

## 2014-02-04 NOTE — Evaluation (Signed)
Physical Therapy Evaluation Patient Details Name: [name] MRN: [id number] DOB: [birth year] Today's Date: 02/04/2014   History of Present Illness  48 yo female admitted with seizure, HTN, hyperglycemia. hx of L midfoot amputation 2014, seizure, neuropathy, bipolar d/o, glaucoma, DM, HTN, Hep C  Clinical Impression  On eval, pt required Min assist for mobility-able to ambulate ~100 feet with walker. High risk for falls. Impaired attention to task, safety awareness. Recommend ST rehab at this time-pt states she is agreeable.     Follow Up Recommendations SNF;Supervision/Assistance - 24 hour    Equipment Recommendations  Rolling walker with 5" wheels (if pt doesn't already have)    Recommendations for Other Services OT consult     Precautions / Restrictions Precautions Precautions: Fall Precaution Comments: Seziure Restrictions Weight Bearing Restrictions: No      Mobility  Bed Mobility Overal bed mobility: Needs Assistance Bed Mobility: Supine to Sit;Sit to Supine     Supine to sit: Supervision Sit to supine: Supervision      Transfers Overall transfer level: Needs assistance   Transfers: Sit to/from Stand Sit to Stand: Min assist         General transfer comment: Assist to rise, stabilize, control descent. VCS safety.   Ambulation/Gait Ambulation/Gait assistance: Min assist Ambulation Distance (Feet): 100 Feet Assistive device: Rolling walker (2 wheeled) Gait Pattern/deviations: Staggering left;Staggering right;Decreased stride length     General Gait Details: Loses balance posteriorly and towards L side. Assist to stabilize pt and maneuver with RW. VCs safety.   Stairs            Wheelchair Mobility    Modified Rankin (Stroke Patients Only)       Balance Overall balance assessment: Needs assistance         Standing balance support: Bilateral upper extremity supported;During functional activity Standing balance-Leahy Scale: Poor                               Pertinent Vitals/Pain "head feels funny-like seizure is right there"    Home Living Family/patient expects to be discharged to:: Private residence Living Arrangements: Children   Type of Home: House Home Access: Level entry     Home Layout: One level Home Equipment: Environmental consultant - 2 wheels      Prior Function Level of Independence: Independent               Hand Dominance        Extremity/Trunk Assessment   Upper Extremity Assessment: Overall WFL for tasks assessed           Lower Extremity Assessment: Generalized weakness      Cervical / Trunk Assessment: Normal  Communication   Communication: No difficulties  Cognition Arousal/Alertness: Awake/alert Behavior During Therapy: WFL for tasks assessed/performed Overall Cognitive Status: No family/caregiver present to determine baseline cognitive functioning Area of Impairment: Attention;Safety/judgement;Problem solving   Current Attention Level: Alternating Memory: Decreased recall of precautions   Safety/Judgement: Decreased awareness of safety   Problem Solving: Requires tactile cues;Requires verbal cues      General Comments      Exercises        Assessment/Plan    PT Assessment Patient needs continued PT services  PT Diagnosis Difficulty walking;Generalized weakness   PT Problem List Decreased strength;Decreased range of motion;Decreased activity tolerance;Decreased balance;Decreased mobility;Decreased knowledge of use of DME;Decreased safety awareness;Decreased cognition  PT Treatment Interventions DME instruction;Gait training;Functional mobility training;Therapeutic activities;Therapeutic exercise;Patient/family education;Balance  training   PT Goals (Current goals can be found in the Care Plan section) Acute Rehab PT Goals Patient Stated Goal: to walk more PT Goal Formulation: With patient Time For Goal Achievement: 02/18/14 Potential to Achieve Goals:  Good    Frequency Min 3X/week   Barriers to discharge        Co-evaluation               End of Session Equipment Utilized During Treatment: Gait belt Activity Tolerance: Patient tolerated treatment well Patient left: in bed;with call [employer]/phone within reach;with nursing/sitter in room           Time: 0858-0908 PT Time Calculation (min): 10 min   Charges:   PT Evaluation $Initial PT Evaluation Tier I: 1 Procedure PT Treatments $Gait Training: 8-22 mins   PT G Codes:          Weston Anna, MPT Pager: [phone]

## 2014-02-04 NOTE — Progress Notes (Signed)
[facility] PROGRESS NOTE  [name] [id number] DOB: April 03, 1966 DOA: 02/01/2014 PCP: Maurice Small, D, MD  Assessment/Plan: #1 breakthrough seizures Likely secondary to noncompliance. Patient stated had not been on the [place] for over a year. No seizures overnight. CT head negative. MRI of the head with no acute abnormalities. No signs or symptoms of infection. Phosphorus level repleted. Continue oral Keppra. PT/OT.   #2 uncontrolled type 2 diabetes with hyperosmolar hyperglycemia Likely secondary to medical noncompliance. Hemoglobin A1c is 12.4 which is an increase from 9.2 last year 02/09/2013. Patient currently off the glucometer. CBGs have ranged from 177 - 263. Increase Lantus to 25 units daily. Continue sliding scale insulin. Increased meal coverage insulin to 6 units 3 times daily.Diabetes education. Patient is interested in outpatient diabetes education. Follow.  #3 hypertensive emergency Likely also secondary to medical noncompliance. Continue Coreg 25 mg twice daily, Norvasc 10 mg daily. Monitor and titrate medications as needed.  #4 acute on chronic kidney disease stage III Likely secondary to a prerenal azotemia. Renal ultrasound negative for hydronephrosis. Patient would good urinary output of 1300 cc over the past 24 hours. Continue IV fluids.  Follow.  #5 hypophosphatemia Repleted.  #6 iron deficiency anemia/aocd Anemia panel consistent with iron deficiency anemia. No overt GI bleed. H&H is stable. Follow H&H. Transfusion threshold hemoglobin less than 7.  #7 prophylaxis Lovenox for DVT prophylaxis.    Code Status: Full Family Communication: Updated patient no family at bedside. Disposition Plan: To skilled nursing facility when medically stable.   Consultants:  Neurology: Dr. Doy Mince 02/02/2014  Procedures:  MRI head 02/02/2014  CT head 02/02/2014  Chest x-ray 02/02/2014  Renal ultrasound  02/02/2014  Antibiotics:  None  HPI/Subjective: No syncopal episodes overnight per nursing. Patient alert and following commands coherent.  Objective: Filed Vitals:   02/04/14 0452  BP: 144/83  Pulse: 79  Temp: 98.4 F (36.9 C)  Resp: 16    Intake/Output Summary (Last 24 hours) at 02/04/14 1149 Last data filed at 02/04/14 1135  Gross per 24 hour  Intake   3400 ml  Output   2200 ml  Net   1200 ml   Filed Weights   02/02/14 0510 02/03/14 0400 02/03/14 1127  Weight: 73.9 kg (162 lb 14.7 oz) 77.4 kg (170 lb 10.2 oz) 78.7 kg (173 lb 8 oz)    Exam:   General:  NAD  Cardiovascular: RRR  Respiratory: CTAB  Abdomen: Soft, nontender, nondistended, positive bowel sounds.  Musculoskeletal: No clubbing cyanosis or edema. Status post left foot transmetatarsal amputation.  Data Reviewed: Basic Metabolic Panel:  Recent Labs Lab 02/02/14 0630 02/02/14 0818 02/02/14 1050 02/02/14 1609 02/03/14 0400 02/04/14 0453  NA 137 139 138 138 134* 136*  K 3.2* 3.3* 3.3* 3.2* 3.9 4.0  CL 100 104 103 102 104 107  CO2 22 23 21 22 21 20   GLUCOSE 376* 271* 164* 159* 410* 253*  BUN 23 24* 23 22 25* 22  CREATININE 2.17* 2.21* 2.23* 2.11* 2.23* 1.95*  CALCIUM 8.5 8.5 8.5 8.5 7.4* 7.1*  MG  --  1.9  --   --   --  1.6  PHOS  --  2.1*  --   --   --  3.6   Liver Function Tests:  Recent Labs Lab 02/02/14 0006  AST 27  ALT 25  ALKPHOS 87  BILITOT 0.2*  PROT 5.9*  ALBUMIN 1.6*   No results found for this basename: LIPASE, AMYLASE,  in the last 168 hours  Recent Labs Lab 02/02/14 0432  AMMONIA 39   CBC:  Recent Labs Lab 01/31/14 2034 02/02/14 0006 02/02/14 0630 02/03/14 0400 02/04/14 0453  WBC 6.2 4.5 6.2 4.8 3.4*  NEUTROABS  --  3.3  --  2.4  --   HGB 12.1 10.4* 11.0* 9.5* 9.3*  HCT 34.7* 30.6* 32.1* 28.1* 27.4*  MCV 81.1 82.7 82.7 83.4 83.8  PLT 356 228 264 248 221   Cardiac Enzymes:  Recent Labs Lab 02/02/14 0006  TROPONINI <0.30   BNP (last 3  results)  Recent Labs  06/01/13 1619  PROBNP 1210.0*   CBG:  Recent Labs Lab 02/03/14 1128 02/03/14 1713 02/03/14 2108 02/04/14 0739 02/04/14 1135  GLUCAP 311* 74 119* 263* 261*    Recent Results (from the past 240 hour(s))  MRSA PCR SCREENING     Status: None   Collection Time    02/02/14  5:29 AM      Result Value Ref Range Status   MRSA by PCR NEGATIVE  NEGATIVE Final   Comment:            The GeneXpert MRSA Assay (FDA     approved for NASAL specimens     only), is one component of a     comprehensive MRSA colonization     surveillance program. It is not     intended to diagnose MRSA     infection nor to guide or     monitor treatment for     MRSA infections.     Performed at [place]     Status: None   Collection Time    02/02/14 12:00 PM      Result Value Ref Range Status   Specimen Description URINE, RANDOM   Final   Special Requests NONE   Final   Culture  Setup Time     Final   Value: 02/02/2014 14:20     Performed at [place]     Final   Value: NO GROWTH     Performed at [facility]   Culture     Final   Value: NO GROWTH     Performed at [facility]   Report Status 02/03/2014 FINAL   Final     Studies: Mr Brain Wo Contrast  02/02/2014   CLINICAL DATA:  Seizure.  Altered mental status.  EXAM: MRI HEAD WITHOUT CONTRAST  TECHNIQUE: Multiplanar, multiecho pulse sequences of the brain and surrounding structures were obtained without intravenous contrast.  COMPARISON:  Head CT 02/02/2014 and MRI 02/13/2008  FINDINGS: Dedicated thin section imaging through the temporal lobes demonstrates normal volume and signal of the hippocampi. There is no evidence of heterotopia.  There is no evidence of acute infarct, intracranial hemorrhage, mass, midline shift, or extra-axial fluid collection. Symmetric, temporoparietal predominant atrophy with enlargement of the sylvian fissures is again seen and  slightly progressed from the prior MRI. Foci of T2 hyperintensity in the periventricular white matter and pons are similar to the prior MRI and nonspecific but compatible with mild chronic small vessel ischemic disease. Dilated perivascular space at the inferior left basal ganglia is unchanged.  Prior bilateral cataract extraction is noted. Major intracranial vascular flow voids are preserved. Paranasal sinuses and mastoid air cells are clear.  IMPRESSION: 1. No evidence of acute intracranial abnormality or mass. 2. Cerebral atrophy as above, mildly progressed from prior MRI.   Electronically Signed   By: Logan Bores  On: 02/02/2014 20:55   US Renal  02/02/2014   CLINICAL DATA:  Acute renal failure.  EXAM: RENAL/URINARY TRACT ULTRASOUND COMPLETE  COMPARISON:  None.  FINDINGS: Right Kidney:  Length: 11.5 cm. Renal parenchyma is slightly echogenic suggesting medical renal disease. No mass or hydronephrosis visualized.  Left Kidney:  Length: 10.6 cm. Renal parenchyma is slightly echogenic suggesting medical renal disease. No mass or hydronephrosis visualized.  Bladder:  Is fully distended, with echogenic material seen in the base of the bladder. No ureteral jets are noted.  IMPRESSION: Renal parenchyma is slightly echogenic bilaterally suggesting medical renal disease. No hydronephrosis or renal obstruction is noted.   Electronically Signed   By: Sabino Dick M.D.   On: 02/02/2014 14:51    Scheduled Meds: . amLODipine  10 mg Oral Daily  . carvedilol  25 mg Oral BID WC  . enoxaparin (LOVENOX) injection  40 mg Subcutaneous Q24H  . folic acid  0.5 mg Oral Daily  . insulin aspart  0-15 Units Subcutaneous TID WC  . insulin aspart  4 Units Subcutaneous TID WC  . insulin glargine  20 Units Subcutaneous q morning - 10a  . iron polysaccharides  150 mg Oral Daily  . levETIRAcetam  500 mg Oral BID  . magnesium sulfate 1 - 4 g bolus IVPB  3 g Intravenous Once  . phosphorus  500 mg Oral TID  . pregabalin  75 mg  Oral BID  . sodium chloride  3 mL Intravenous Q12H   Continuous Infusions: . sodium chloride 100 mL/hr at 02/03/14 2214    Principal Problem:   Seizures Active Problems:   Hypertensive emergency   Uncontrolled type 2 diabetes mellitus with hyperosmolar nonketotic hyperglycemia   Renal failure (ARF), acute on chronic   DM (diabetes mellitus), type 2, uncontrolled   Hypertension, accelerated   Normocytic anemia   CKD (chronic kidney disease), stage III   Noncompliance    Time spent: Guin MD [facility] Pager [phone]. If 7PM-7AM, please contact night-coverage at [email or web address], password [facility] 02/04/2014, 11:49 AM  LOS: 3 days

## 2014-02-04 NOTE — Progress Notes (Signed)
Inpatient Diabetes Program Recommendations  AACE/ADA: New Consensus Statement on Inpatient Glycemic Control (2013)  Target Ranges:  Prepandial:   less than 140 mg/dL      Peak postprandial:   less than 180 mg/dL (1-2 hours)      Critically ill patients:  140 - 180 mg/dL   Reason for Visit: Hyperglycemia  Results for [name], [name] (MRN [id number]) as of 02/04/2014 12:38  Ref. Range 02/03/2014 11:28 02/03/2014 17:13 02/03/2014 21:08 02/04/2014 07:39 02/04/2014 11:35  Glucose-Capillary Latest Range: 70-99 mg/dL 311 (H) 74 119 (H) 263 (H) 261 (H)   Eating 75 - 100%. Blood sugars continue >200. Needs increase in Lantus and Novolog meal coverage.  Recommendations: Increase Novolog to 6 units tidwc. Increase Lantus to 25 units QAM.  Will continue to follow. Thank you. Lorenda Peck, RD, LDN, CDE Inpatient Diabetes Coordinator [phone]

## 2014-02-05 LAB — GLUCOSE, CAPILLARY
GLUCOSE-CAPILLARY: 378 mg/dL — AB (ref 70–99)
GLUCOSE-CAPILLARY: 226 mg/dL — AB (ref 70–99)
Glucose-Capillary: 161 mg/dL — ABNORMAL HIGH (ref 70–99)

## 2014-02-05 LAB — BASIC METABOLIC PANEL
ANION GAP: 10 (ref 5–15)
BUN: 19 mg/dL (ref 6–23)
CHLORIDE: 101 meq/L (ref 96–112)
CO2: 20 meq/L (ref 19–32)
Calcium: 7 mg/dL — ABNORMAL LOW (ref 8.4–10.5)
Creatinine, Ser: 1.84 mg/dL — ABNORMAL HIGH (ref 0.50–1.10)
GFR calc Af Amer: 37 mL/min — ABNORMAL LOW (ref >90)
GFR calc non Af Amer: 32 mL/min — ABNORMAL LOW (ref >90)
Glucose, Bld: 539 mg/dL — ABNORMAL HIGH (ref 70–99)
Potassium: 4.4 mEq/L (ref 3.7–5.3)
SODIUM: 131 meq/L — AB (ref 137–147)

## 2014-02-05 LAB — CBC
HCT: 30.3 % — ABNORMAL LOW (ref 36.0–46.0)
HEMOGLOBIN: 9.8 g/dL — AB (ref 12.0–15.0)
MCH: 27.6 pg (ref 26.0–34.0)
MCHC: 32.3 g/dL (ref 30.0–36.0)
MCV: 85.4 fL (ref 78.0–100.0)
Platelets: 208 10*3/uL (ref 150–400)
RBC: 3.55 MIL/uL — AB (ref 3.87–5.11)
RDW: 15 % (ref 11.5–15.5)
WBC: 2.8 10*3/uL — AB (ref 4.0–10.5)

## 2014-02-05 LAB — MAGNESIUM: Magnesium: 1.8 mg/dL (ref 1.5–2.5)

## 2014-02-05 MED ORDER — ASENAPINE MALEATE 5 MG SL SUBL
5.0000 mg | SUBLINGUAL_TABLET | Freq: Every day | SUBLINGUAL | Status: DC
  Filled 2014-02-05 (×2): qty 1

## 2014-02-05 MED ORDER — PANTOPRAZOLE SODIUM 40 MG PO TBEC
40.0000 mg | DELAYED_RELEASE_TABLET | Freq: Every day | ORAL | Status: DC
  Administered 2014-02-05 – 2014-02-10 (×6): 40 mg via ORAL
  Filled 2014-02-05 (×6): qty 1

## 2014-02-05 MED ORDER — DULOXETINE HCL 60 MG PO CPEP
60.0000 mg | ORAL_CAPSULE | Freq: Every day | ORAL | Status: DC
  Administered 2014-02-05 – 2014-02-06 (×2): 60 mg via ORAL
  Filled 2014-02-05 (×2): qty 1

## 2014-02-05 MED ORDER — POLYETHYLENE GLYCOL 3350 17 G PO PACK
17.0000 g | PACK | Freq: Two times a day (BID) | ORAL | Status: DC | PRN
  Filled 2014-02-05: qty 1

## 2014-02-05 MED ORDER — INSULIN GLARGINE 100 UNIT/ML ~~LOC~~ SOLN
30.0000 [IU] | Freq: Every morning | SUBCUTANEOUS | Status: DC
  Administered 2014-02-05 – 2014-02-06 (×2): 30 [IU] via SUBCUTANEOUS
  Filled 2014-02-05 (×2): qty 0.3

## 2014-02-05 MED ORDER — HYDRALAZINE HCL 25 MG PO TABS
25.0000 mg | ORAL_TABLET | Freq: Three times a day (TID) | ORAL | Status: DC
  Administered 2014-02-05 – 2014-02-07 (×6): 25 mg via ORAL
  Filled 2014-02-05 (×9): qty 1

## 2014-02-05 MED ORDER — POLYETHYLENE GLYCOL 3350 17 G PO PACK
17.0000 g | PACK | Freq: Two times a day (BID) | ORAL | Status: DC
  Administered 2014-02-05 – 2014-02-10 (×11): 17 g via ORAL
  Filled 2014-02-05 (×12): qty 1

## 2014-02-05 MED ORDER — GI COCKTAIL ~~LOC~~
30.0000 mL | Freq: Three times a day (TID) | ORAL | Status: DC | PRN
  Administered 2014-02-05: 30 mL via ORAL
  Filled 2014-02-05: qty 30

## 2014-02-05 MED ORDER — DEXTROSE 5 % IV SOLN
3.0000 g | Freq: Once | INTRAVENOUS | Status: AC
  Administered 2014-02-05: 3 g via INTRAVENOUS
  Filled 2014-02-05: qty 6

## 2014-02-05 NOTE — Progress Notes (Signed)
Clinical Social Work Department BRIEF PSYCHOSOCIAL ASSESSMENT 02/05/2014  Patient:  [name], [name]     Account Number:  [id number]     Admit date:  02/01/2014  Clinical Social Worker:  Levie Heritage  Date/Time:  02/05/2014 04:01 PM  Referred by:  Physician  Date Referred:  02/05/2014 Referred for  SNF Placement   Other Referral:   Interview type:  Patient Other interview type:    PSYCHOSOCIAL DATA Living Status:  FAMILY Admitted from facility:   Level of care:   Primary support name:  Margie Primary support relationship to patient:  FAMILY Degree of support available:   adequate    CURRENT CONCERNS Current Concerns  Post-Acute Placement   Other Concerns:    SOCIAL WORK ASSESSMENT / PLAN Met with Pt to discuss d/c plans.    Pt stated that she did work with PT and that she was very unsteady on her feet.  She stated that she often feels that she's going to fall and, because of this, she is agreeable to SNF.  She stated that she was at [facility] approx 5 months ago and that she did receive some benefit from being there.    Pt agreed to a SNF search and is hopeful for [facility].    CSW provided Pt with a SNF list.    CSW thanked Pt for her time.   Assessment/plan status:  Psychosocial Support/Ongoing Assessment of Needs Other assessment/ plan:   Information/referral to community resources:   [facility] list    PATIENT'S/FAMILY'S RESPONSE TO PLAN OF CARE: Pt was calm, cooperative and pleasant.  Pt expressed frustration with not being able to get around as much as she would like. She stated that this is why she's amenable to SNF and hopes that she will be able to move on with her life once she's completed rehab.   Bernita Raisin, [facility] Social Work [phone]

## 2014-02-05 NOTE — Progress Notes (Signed)
Multiple notifications received for pt's cardiac leads off.  Sitter at bedside corrected all leads every time notification received, but pt very restless in bed and constantly turning over, displacing leads.  Will continue to attempt to keep leads in place.  Coolidge Breeze, RN 02/05/2014

## 2014-02-05 NOTE — Progress Notes (Signed)
[facility] PROGRESS NOTE  [name] [id number] DOB: 1965/12/29 DOA: 02/01/2014 PCP: Maurice Small, D, MD  Assessment/Plan: #1 breakthrough seizures Likely secondary to noncompliance. Patient stated had not been on the [place] for over a year. No seizures overnight. CT head negative. MRI of the head with no acute abnormalities. No signs or symptoms of infection. Phosphorus level repleted. Continue oral Keppra. PT/OT.   #2 uncontrolled type 2 diabetes with hyperosmolar hyperglycemia Likely secondary to medical noncompliance. Hemoglobin A1c is 12.4 which is an increase from 9.2 last year 02/09/2013. Patient currently off the glucometer. CBGs have ranged from 177 - 378. Increase Lantus to 30 units daily. Continue sliding scale insulin. Continue meal coverage insulin 6 units 3 times daily. Diabetes education. Patient is interested in outpatient diabetes education. Follow.  #3 hypertensive emergency Likely also secondary to medical noncompliance. Continue Coreg 25 mg twice daily, Norvasc 10 mg daily. Start hydralazine.  #4 acute on chronic kidney disease stage III Likely secondary to a prerenal azotemia. Renal ultrasound negative for hydronephrosis. Patient would good urinary output of 1300 cc over the past 24 hours. Continue IV fluids.  Follow.  #5 hypophosphatemia Repleted.  #6 iron deficiency anemia/aocd Anemia panel consistent with iron deficiency anemia. No overt GI bleed. H&H is stable. Follow H&H. Transfusion threshold hemoglobin less than 7.  #7 prophylaxis Lovenox for DVT prophylaxis.    Code Status: Full Family Communication: Updated patient no family at bedside. Disposition Plan: To skilled nursing facility when medically stable.   Consultants:  Neurology: Dr. Doy Mince 02/02/2014  Procedures:  MRI head 02/02/2014  CT head 02/02/2014  Chest x-ray 02/02/2014  Renal ultrasound 02/02/2014  Antibiotics:  None  HPI/Subjective: No syncopal episodes  overnight per nursing. Patient alert and following commands coherent.  Objective: Filed Vitals:   02/05/14 1022  BP: 152/90  Pulse: 74  Temp: 97.3 F (36.3 C)  Resp: 18    Intake/Output Summary (Last 24 hours) at 02/05/14 1049 Last data filed at 02/05/14 0736  Gross per 24 hour  Intake 5138.33 ml  Output   3350 ml  Net 1788.33 ml   Filed Weights   02/02/14 0510 02/03/14 0400 02/03/14 1127  Weight: 73.9 kg (162 lb 14.7 oz) 77.4 kg (170 lb 10.2 oz) 78.7 kg (173 lb 8 oz)    Exam:   General:  NAD  Cardiovascular: RRR  Respiratory: CTAB  Abdomen: Soft, nontender, nondistended, positive bowel sounds.  Musculoskeletal: No clubbing cyanosis or edema. Status post left foot transmetatarsal amputation.  Data Reviewed: Basic Metabolic Panel:  Recent Labs Lab 02/02/14 0630 02/02/14 0818 02/02/14 1050 02/02/14 1609 02/03/14 0400 02/04/14 0453 02/05/14 0426  NA 137 139 138 138 134* 136* 131*  K 3.2* 3.3* 3.3* 3.2* 3.9 4.0 4.4  CL 100 104 103 102 104 107 101  CO2 22 23 21 22 21 20 20   GLUCOSE 376* 271* 164* 159* 410* 253* 539*  BUN 23 24* 23 22 25* 22 19  CREATININE 2.17* 2.21* 2.23* 2.11* 2.23* 1.95* 1.84*  CALCIUM 8.5 8.5 8.5 8.5 7.4* 7.1* 7.0*  MG  --  1.9  --   --   --  1.6 1.8  PHOS  --  2.1*  --   --   --  3.6  --    Liver Function Tests:  Recent Labs Lab 02/02/14 0006  AST 27  ALT 25  ALKPHOS 87  BILITOT 0.2*  PROT 5.9*  ALBUMIN 1.6*   No results found for this basename: LIPASE, AMYLASE,  in the last 168 hours  Recent Labs Lab 02/02/14 0432  AMMONIA 39   CBC:  Recent Labs Lab 02/02/14 0006 02/02/14 0630 02/03/14 0400 02/04/14 0453 02/05/14 0426  WBC 4.5 6.2 4.8 3.4* 2.8*  NEUTROABS 3.3  --  2.4  --   --   HGB 10.4* 11.0* 9.5* 9.3* 9.8*  HCT 30.6* 32.1* 28.1* 27.4* 30.3*  MCV 82.7 82.7 83.4 83.8 85.4  PLT 228 264 248 221 208   Cardiac Enzymes:  Recent Labs Lab 02/02/14 0006  TROPONINI <0.30   BNP (last 3 results)  Recent  Labs  06/01/13 1619  PROBNP 1210.0*   CBG:  Recent Labs Lab 02/04/14 0739 02/04/14 1135 02/04/14 1646 02/04/14 2122 02/05/14 0734  GLUCAP 263* 261* 177* 289* 378*    Recent Results (from the past 240 hour(s))  MRSA PCR SCREENING     Status: None   Collection Time    02/02/14  5:29 AM      Result Value Ref Range Status   MRSA by PCR NEGATIVE  NEGATIVE Final   Comment:            The GeneXpert MRSA Assay (FDA     approved for NASAL specimens     only), is one component of a     comprehensive MRSA colonization     surveillance program. It is not     intended to diagnose MRSA     infection nor to guide or     monitor treatment for     MRSA infections.     Performed at [facility]     Status: None   Collection Time    02/02/14 12:00 PM      Result Value Ref Range Status   Specimen Description URINE, RANDOM   Final   Special Requests NONE   Final   Culture  Setup Time     Final   Value: 02/02/2014 14:20     Performed at [facility]     Final   Value: NO GROWTH     Performed at [facility]   Culture     Final   Value: NO GROWTH     Performed at [facility]   Report Status 02/03/2014 FINAL   Final     Studies: No results found.  Scheduled Meds: . amLODipine  10 mg Oral Daily  . asenapine  5 mg Sublingual QHS  . carvedilol  25 mg Oral BID WC  . DULoxetine  60 mg Oral Daily  . enoxaparin (LOVENOX) injection  40 mg Subcutaneous Q24H  . folic acid  0.5 mg Oral Daily  . insulin aspart  0-15 Units Subcutaneous TID WC  . insulin aspart  6 Units Subcutaneous TID WC  . insulin glargine  30 Units Subcutaneous q morning - 10a  . iron polysaccharides  150 mg Oral Daily  . levETIRAcetam  500 mg Oral BID  . pantoprazole  40 mg Oral Daily  . pregabalin  75 mg Oral BID  . sodium chloride  3 mL Intravenous Q12H   Continuous Infusions:    Principal Problem:   Seizures Active Problems:   Hypertensive  emergency   Uncontrolled type 2 diabetes mellitus with hyperosmolar nonketotic hyperglycemia   Renal failure (ARF), acute on chronic   DM (diabetes mellitus), type 2, uncontrolled   Hypertension, accelerated   Normocytic anemia   CKD (chronic kidney disease), stage III   Noncompliance  Time spent: Over[place] MD [facility] Pager [phone]. If 7PM-7AM, please contact night-coverage at [email or web address], password [facility] 02/05/2014, 10:49 AM  LOS: 4 days

## 2014-02-05 NOTE — Progress Notes (Signed)
Pt pulled out own IV.  Currently refusing another IV to be placed. Dr. Grandville Silos notified.  Coolidge Breeze, RN 02/05/2014

## 2014-02-06 ENCOUNTER — Encounter (HOSPITAL_COMMUNITY): Payer: Self-pay

## 2014-02-06 DIAGNOSIS — F1910 Other psychoactive substance abuse, uncomplicated: Secondary | ICD-10-CM

## 2014-02-06 DIAGNOSIS — F319 Bipolar disorder, unspecified: Secondary | ICD-10-CM

## 2014-02-06 DIAGNOSIS — F259 Schizoaffective disorder, unspecified: Secondary | ICD-10-CM

## 2014-02-06 HISTORY — DX: Bipolar disorder, unspecified: F31.9

## 2014-02-06 LAB — BASIC METABOLIC PANEL
Anion gap: 12 (ref 5–15)
BUN: 20 mg/dL (ref 6–23)
CALCIUM: 7.1 mg/dL — AB (ref 8.4–10.5)
CO2: 18 mEq/L — ABNORMAL LOW (ref 19–32)
Chloride: 101 mEq/L (ref 96–112)
Creatinine, Ser: 1.73 mg/dL — ABNORMAL HIGH (ref 0.50–1.10)
GFR calc Af Amer: 39 mL/min — ABNORMAL LOW (ref >90)
GFR calc non Af Amer: 34 mL/min — ABNORMAL LOW (ref >90)
GLUCOSE: 312 mg/dL — AB (ref 70–99)
Potassium: 4.2 mEq/L (ref 3.7–5.3)
Sodium: 131 mEq/L — ABNORMAL LOW (ref 137–147)

## 2014-02-06 LAB — CBC
HEMATOCRIT: 28.4 % — AB (ref 36.0–46.0)
Hemoglobin: 9.4 g/dL — ABNORMAL LOW (ref 12.0–15.0)
MCH: 27.8 pg (ref 26.0–34.0)
MCHC: 33.1 g/dL (ref 30.0–36.0)
MCV: 84 fL (ref 78.0–100.0)
PLATELETS: 191 10*3/uL (ref 150–400)
RBC: 3.38 MIL/uL — AB (ref 3.87–5.11)
RDW: 14.7 % (ref 11.5–15.5)
WBC: 3.5 10*3/uL — ABNORMAL LOW (ref 4.0–10.5)

## 2014-02-06 LAB — GLUCOSE, CAPILLARY
GLUCOSE-CAPILLARY: 140 mg/dL — AB (ref 70–99)
Glucose-Capillary: 334 mg/dL — ABNORMAL HIGH (ref 70–99)
Glucose-Capillary: 205 mg/dL — ABNORMAL HIGH (ref 70–99)
Glucose-Capillary: 159 mg/dL — ABNORMAL HIGH (ref 70–99)

## 2014-02-06 LAB — MRSA PCR SCREENING: MRSA BY PCR: NEGATIVE

## 2014-02-06 MED ORDER — OXYCODONE HCL 5 MG PO TABS
5.0000 mg | ORAL_TABLET | Freq: Once | ORAL | Status: AC
  Administered 2014-02-06: 5 mg via ORAL
  Filled 2014-02-06: qty 1

## 2014-02-06 MED ORDER — INSULIN GLARGINE 100 UNIT/ML ~~LOC~~ SOLN
5.0000 [IU] | Freq: Once | SUBCUTANEOUS | Status: AC
  Administered 2014-02-06: 5 [IU] via SUBCUTANEOUS
  Filled 2014-02-06: qty 0.05

## 2014-02-06 MED ORDER — SODIUM BICARBONATE 650 MG PO TABS
650.0000 mg | ORAL_TABLET | Freq: Two times a day (BID) | ORAL | Status: DC
  Administered 2014-02-06 – 2014-02-10 (×9): 650 mg via ORAL
  Filled 2014-02-06 (×10): qty 1

## 2014-02-06 MED ORDER — DIPHENHYDRAMINE HCL 25 MG PO CAPS
25.0000 mg | ORAL_CAPSULE | Freq: Once | ORAL | Status: AC
  Administered 2014-02-06: 25 mg via ORAL
  Filled 2014-02-06: qty 1

## 2014-02-06 MED ORDER — OLANZAPINE-FLUOXETINE HCL 6-25 MG PO CAPS
1.0000 | ORAL_CAPSULE | Freq: Every day | ORAL | Status: DC
  Administered 2014-02-06 – 2014-02-07 (×2): 1 via ORAL
  Filled 2014-02-06 (×3): qty 1

## 2014-02-06 MED ORDER — OLANZAPINE-FLUOXETINE HCL 6-25 MG PO CAPS: 1.0000 | ORAL_CAPSULE | Freq: Every day | ORAL | Status: DC

## 2014-02-06 MED ORDER — DULOXETINE HCL 30 MG PO CPEP
30.0000 mg | ORAL_CAPSULE | Freq: Every day | ORAL | Status: DC
  Administered 2014-02-07 – 2014-02-08 (×2): 30 mg via ORAL
  Filled 2014-02-06 (×2): qty 1

## 2014-02-06 MED ORDER — MAGNESIUM CITRATE PO SOLN
1.0000 | Freq: Once | ORAL | Status: AC
  Administered 2014-02-06: 1 via ORAL

## 2014-02-06 MED ORDER — INSULIN GLARGINE 100 UNIT/ML ~~LOC~~ SOLN
35.0000 [IU] | Freq: Every morning | SUBCUTANEOUS | Status: DC
  Administered 2014-02-07: 35 [IU] via SUBCUTANEOUS
  Filled 2014-02-06: qty 0.35

## 2014-02-06 MED ORDER — FLEET ENEMA 7-19 GM/118ML RE ENEM
1.0000 | ENEMA | Freq: Once | RECTAL | Status: AC
  Administered 2014-02-06: 1 via RECTAL
  Filled 2014-02-06: qty 1

## 2014-02-06 NOTE — Progress Notes (Signed)
Patient refused Saphris stating makes her head swell. NP on call notified and medication d/c'd. Pharmacy put med as allergy.

## 2014-02-06 NOTE — Progress Notes (Signed)
[facility] PROGRESS NOTE  [name] [id number] DOB: [birth year] DOA: 02/01/2014 PCP: Maurice Small, D, MD  Assessment/Plan: #1 breakthrough seizures Likely secondary to noncompliance. Patient stated had not been on the [place] for over a year. No seizures overnight. CT head negative. MRI of the head with no acute abnormalities. No signs or symptoms of infection. Phosphorus level repleted. Continue oral Keppra. PT/OT.   #2 uncontrolled type 2 diabetes with hyperosmolar hyperglycemia Likely secondary to medical noncompliance. Hemoglobin A1c is 12.4 which is an increase from 9.2 last year 02/09/2013. Patient currently off the glucometer. CBGs have ranged from 140 - 334. Increase Lantus to 35 units daily. Continue sliding scale insulin. Continue meal coverage insulin 6 units 3 times daily. Diabetes education. Patient is interested in outpatient diabetes education. Follow.  #3 hypertensive emergency Likely also secondary to medical noncompliance. Continue Coreg 25 mg twice daily, Norvasc 10 mg daily. Continue hydralazine and titrate as needed.  #4 acute on chronic kidney disease stage III Likely secondary to a prerenal azotemia. Renal ultrasound negative for hydronephrosis. Patient with good urinary output of 1750 cc over the past 24 hours. NSL IV fluids.  Follow.  #5 hypophosphatemia Repleted.  #6 iron deficiency anemia/aocd Anemia panel consistent with iron deficiency anemia. No overt GI bleed. H&H is stable. Follow H&H. Transfusion threshold hemoglobin less than 7.  #7 history of bipolar disorder Patient with some complaints of visual hallucinations. Patient refusing her saphris as she stated in the past it made her head swell. Will consult with psychiatry for further evaluation and recommendations.  #8 prophylaxis Lovenox for DVT prophylaxis.    Code Status: Full Family Communication: Updated patient no family at bedside. Disposition Plan: To skilled nursing facility when  medically stable.   Consultants:  Neurology: Dr. Doy Mince 02/02/2014  Procedures:  MRI head 02/02/2014  CT head 02/02/2014  Chest x-ray 02/02/2014  Renal ultrasound 02/02/2014  Antibiotics:  None  HPI/Subjective: No syncopal episodes overnight per nursing. Patient alert and following commands coherent. Patient stated that she refused her saphris last night as in the past it made her head swell up. Patient also with complaints of visual hallucinations.  Objective: Filed Vitals:   02/06/14 1319  BP: 131/82  Pulse: 82  Temp: 97.8 F (36.6 C)  Resp: 20    Intake/Output Summary (Last 24 hours) at 02/06/14 1553 Last data filed at 02/06/14 1300  Gross per 24 hour  Intake   1440 ml  Output   1650 ml  Net   -210 ml   Filed Weights   02/02/14 0510 02/03/14 0400 02/03/14 1127  Weight: 73.9 kg (162 lb 14.7 oz) 77.4 kg (170 lb 10.2 oz) 78.7 kg (173 lb 8 oz)    Exam:   General:  NAD  Cardiovascular: RRR  Respiratory: CTAB  Abdomen: Soft, nontender, nondistended, positive bowel sounds.  Musculoskeletal: No clubbing cyanosis or edema. Status post left foot transmetatarsal amputation.  Data Reviewed: Basic Metabolic Panel:  Recent Labs Lab 02/02/14 0630 02/02/14 0818  02/02/14 1609 02/03/14 0400 02/04/14 0453 02/05/14 0426 02/06/14 0440  NA 137 139  < > 138 134* 136* 131* 131*  K 3.2* 3.3*  < > 3.2* 3.9 4.0 4.4 4.2  CL 100 104  < > 102 104 107 101 101  CO2 22 23  < > 22 21 20 20  18*  GLUCOSE 376* 271*  < > 159* 410* 253* 539* 312*  BUN 23 24*  < > 22 25* 22 19 20   CREATININE 2.17* 2.21*  < >  2.11* 2.23* 1.95* 1.84* 1.73*  CALCIUM 8.5 8.5  < > 8.5 7.4* 7.1* 7.0* 7.1*  MG  --  1.9  --   --   --  1.6 1.8  --   PHOS  --  2.1*  --   --   --  3.6  --   --   < > = values in this interval not displayed. Liver Function Tests:  Recent Labs Lab 02/02/14 0006  AST 27  ALT 25  ALKPHOS 87  BILITOT 0.2*  PROT 5.9*  ALBUMIN 1.6*   No results found for this  basename: LIPASE, AMYLASE,  in the last 168 hours  Recent Labs Lab 02/02/14 0432  AMMONIA 39   CBC:  Recent Labs Lab 02/02/14 0006 02/02/14 0630 02/03/14 0400 02/04/14 0453 02/05/14 0426 02/06/14 0440  WBC 4.5 6.2 4.8 3.4* 2.8* 3.5*  NEUTROABS 3.3  --  2.4  --   --   --   HGB 10.4* 11.0* 9.5* 9.3* 9.8* 9.4*  HCT 30.6* 32.1* 28.1* 27.4* 30.3* 28.4*  MCV 82.7 82.7 83.4 83.8 85.4 84.0  PLT 228 264 248 221 208 191   Cardiac Enzymes:  Recent Labs Lab 02/02/14 0006  TROPONINI <0.30   BNP (last 3 results)  Recent Labs  06/01/13 1619  PROBNP 1210.0*   CBG:  Recent Labs Lab 02/05/14 0734 02/05/14 1145 02/05/14 1659 02/05/14 2222 02/06/14 1128  GLUCAP 378* 226* 161* 140* 334*    Recent Results (from the past 240 hour(s))  MRSA PCR SCREENING     Status: None   Collection Time    02/02/14  5:29 AM      Result Value Ref Range Status   MRSA by PCR NEGATIVE  NEGATIVE Final   Comment:            The GeneXpert MRSA Assay (FDA     approved for NASAL specimens     only), is one component of a     comprehensive MRSA colonization     surveillance program. It is not     intended to diagnose MRSA     infection nor to guide or     monitor treatment for     MRSA infections.     Performed at [facility] [place]     Status: None   Collection Time    02/02/14 12:00 PM      Result Value Ref Range Status   Specimen Description URINE, RANDOM   Final   Special Requests NONE   Final   Culture  Setup Time     Final   Value: 02/02/2014 14:20     Performed at [facility]     Final   Value: NO GROWTH     Performed at [facility]   Culture     Final   Value: NO GROWTH     Performed at [facility]   Report Status 02/03/2014 FINAL   Final  MRSA PCR SCREENING     Status: None   Collection Time    02/06/14  3:04 AM      Result Value Ref Range Status   MRSA by PCR NEGATIVE  NEGATIVE Final   Comment:            The  GeneXpert MRSA Assay (FDA     approved for NASAL specimens     only), is one component of a     comprehensive MRSA  colonization     surveillance program. It is not     intended to diagnose MRSA     infection nor to guide or     monitor treatment for     MRSA infections.     Studies: No results found.  Scheduled Meds: . amLODipine  10 mg Oral Daily  . carvedilol  25 mg Oral BID WC  . DULoxetine  60 mg Oral Daily  . enoxaparin (LOVENOX) injection  40 mg Subcutaneous Q24H  . folic acid  0.5 mg Oral Daily  . hydrALAZINE  25 mg Oral 3 times per day  . insulin aspart  0-15 Units Subcutaneous TID WC  . insulin aspart  6 Units Subcutaneous TID WC  . [START ON 02/07/2014] insulin glargine  35 Units Subcutaneous q morning - 10a  . insulin glargine  5 Units Subcutaneous Once  . iron polysaccharides  150 mg Oral Daily  . levETIRAcetam  500 mg Oral BID  . pantoprazole  40 mg Oral Daily  . polyethylene glycol  17 g Oral BID  . pregabalin  75 mg Oral BID  . sodium bicarbonate  650 mg Oral BID  . sodium chloride  3 mL Intravenous Q12H   Continuous Infusions:    Principal Problem:   Seizures Active Problems:   Hypertensive emergency   Uncontrolled type 2 diabetes mellitus with hyperosmolar nonketotic hyperglycemia   Renal failure (ARF), acute on chronic   DM (diabetes mellitus), type 2, uncontrolled   Hypertension, accelerated   Normocytic anemia   CKD (chronic kidney disease), stage III   Noncompliance   Bipolar disorder, unspecified    Time spent: [place] MD [facility] Pager [phone]. If 7PM-7AM, please contact night-coverage at [email or web address], password [facility] 02/06/2014, 3:53 PM  LOS: 5 days

## 2014-02-06 NOTE — Consult Note (Signed)
[facility] Face-to-Face Psychiatry Consult   Reason for Consult:  Bipolar disorder Referring Physician:  Dr. [name] is an 48 y.o. female. Total Time spent with patient: 45 minutes  Assessment: AXIS I:  Schizoaffective Disorder and Substance Abuse AXIS II:  Deferred AXIS III:   Past Medical History  Diagnosis Date  . Neuropathy   . Glaucoma   . Bipolar 1 disorder   . CHF (congestive heart failure)   . Refusal of blood transfusions as patient is Jehovah's Witness   . Hypertension   . Shortness of breath   . Diabetes mellitus     INSULIN DEPENDENT  . TIA (transient ischemic attack)   . Neuromuscular disorder     DIABETIC NEUROPATHY  . Chronic kidney disease   . Hepatitis C virus   . Seizure     diagnosed 2014  . MRSA infection    AXIS IV:  other psychosocial or environmental problems, problems related to social environment and problems with primary support group AXIS V:  41-50 serious symptoms  Plan:  Paged Dr. Grandville Silos No evidence of imminent risk to self or others at present.   Patient does not meet criteria for psychiatric inpatient admission. Supportive therapy provided about ongoing stressors. Discussed crisis plan, support from social network, calling 911, coming to the Emergency Department, and calling Suicide Hotline. Provide Symbyax 6/25 mg daily at bedtime and decrease Cymbalta to 30 mg daily. Appreciate psychiatric consultation Please contact [phone] if needs further assistance  Subjective:   [name] is a 48 y.o. female patient admitted with bipolar and s/p seizure.  HPI: Patient is seen, chart reviewed and case discussed with the staff RN, paged Dr. Grandville Silos. Patient reported she has been suffering with auditory and visual hallucinations, she described her auditory hallucinations are chaotic moistens bothering her, paranoid delusions, says her sister is watching her when she is watching TV and also reported people are talking about her, unable to  sleep most of the nights and also reported unable to tolerate her new psychiatric medication Latuda secondary to swelling her face . Patient reportedly not able to tolerate other antipsychotic medication risperidone and Seroquel. Patient reportedly never taken medications Zyprexa Symbyax. Patient has been living with her daughter, daughters friend and her children. patient has history of seizures in the past and presented with report status post seizure episode. Patient also reported she has been taking Adderall for the last 14 years which was started [place], [place] and she was relocated to [place] in the month of April 2015. Patient denied suicidal or homicidal ideation, intentions or plans. Patient was seen [facility] mental health but does not like treatment is helping her and requesting to be referred to the different outpatient treatment center. Patient has a history of substance abuse including alcohol and cocaine in her last use was a week ago. Patient has no urine drug screen performed.  Medical history: [name] is a 48 y.o. female with a history of Seizures, IDDM, HTN, CHF, and Bipolar Disorder who was brought to the ED after her daughter witnessed her having a seizure. She arrived to the ED and was post -Ictal with Blood pressures measuring 220/120. She was given Labetalol and had transient improvement but her blood pressure rebounded back into the 025'K systolic. She was then placed on a Cardene drip for blood pressure control. Her lab studies revealed a glucose levelof 669 and she was placed on the glucose stabilizer. She was referred for medical admission.  Her son is at the bedside and he is assisting with the medical history.   HPI Elements:   Location:  Psychosis. Quality:  Poor. Severity:  Moderate. Timing:  Status post seizure.  Past Psychiatric History: Past Medical History  Diagnosis Date  . Neuropathy   . Glaucoma   . Bipolar 1 disorder   . CHF (congestive heart  failure)   . Refusal of blood transfusions as patient is Jehovah's Witness   . Hypertension   . Shortness of breath   . Diabetes mellitus     INSULIN DEPENDENT  . TIA (transient ischemic attack)   . Neuromuscular disorder     DIABETIC NEUROPATHY  . Chronic kidney disease   . Hepatitis C virus   . Seizure     diagnosed 2014  . MRSA infection     reports that she has been smoking Cigarettes.  She has a 7.5 pack-year smoking history. She has never used smokeless tobacco. She reports that she drinks alcohol. She reports that she does not use illicit drugs. Family History  Problem Relation Age of Onset  . Hypertension Mother   . Hypertension Father      Living Arrangements: Children   Abuse/Neglect [facility]) Physical Abuse: Denies Verbal Abuse: Denies Sexual Abuse: Denies Allergies:   Allergies  Allergen Reactions  . Gabapentin Itching and Swelling  . Saphris [Asenapine] Swelling    Swelling of the face  . Tramadol     Causes legs to swell  . Penicillins Hives    ACT Assessment Complete:  No  Objective: Blood pressure 147/92, pulse 85, temperature 97.5 F (36.4 C), temperature source Oral, resp. rate 20, height $RemoveBe'5\' 6"'lfoNLpnuS$  (0.086 m), weight 78.7 kg (173 lb 8 oz), SpO2 100.00%.Body mass index is 28.02 kg/(m^2). Results for orders placed during the hospital encounter of 02/01/14 (from the past 72 hour(s))  GLUCOSE, CAPILLARY     Status: None   Collection Time    02/03/14  5:13 PM      Result Value Ref Range   Glucose-Capillary 74  70 - 99 mg/dL   Comment 1 Documented in Chart     Comment 2 Notify RN    GLUCOSE, CAPILLARY     Status: Abnormal   Collection Time    02/03/14  9:08 PM      Result Value Ref Range   Glucose-Capillary 119 (*) 70 - 99 mg/dL   Comment 1 Documented in Chart     Comment 2 Notify RN    BASIC METABOLIC PANEL     Status: Abnormal   Collection Time    02/04/14  4:53 AM      Result Value Ref Range   Sodium 136 (*) 137 - 147 mEq/L   Potassium 4.0  3.7 -  5.3 mEq/L   Chloride 107  96 - 112 mEq/L   CO2 20  19 - 32 mEq/L   Glucose, Bld 253 (*) 70 - 99 mg/dL   BUN 22  6 - 23 mg/dL   Creatinine, Ser 1.95 (*) 0.50 - 1.10 mg/dL   Calcium 7.1 (*) 8.4 - 10.5 mg/dL   GFR calc non Af Amer 29 (*) >90 mL/min   GFR calc Af Amer 34 (*) >90 mL/min   Comment: (NOTE)     The eGFR has been calculated using the CKD EPI equation.     This calculation has not been validated in all clinical situations.     eGFR's persistently <90 mL/min signify possible Chronic Kidney  Disease.   Anion gap 9  5 - 15  CBC     Status: Abnormal   Collection Time    02/04/14  4:53 AM      Result Value Ref Range   WBC 3.4 (*) 4.0 - 10.5 K/uL   RBC 3.27 (*) 3.87 - 5.11 MIL/uL   Hemoglobin 9.3 (*) 12.0 - 15.0 g/dL   HCT 27.4 (*) 36.0 - 46.0 %   MCV 83.8  78.0 - 100.0 fL   MCH 28.4  26.0 - 34.0 pg   MCHC 33.9  30.0 - 36.0 g/dL   RDW 14.9  11.5 - 15.5 %   Platelets 221  150 - 400 K/uL  MAGNESIUM     Status: None   Collection Time    02/04/14  4:53 AM      Result Value Ref Range   Magnesium 1.6  1.5 - 2.5 mg/dL  PHOSPHORUS     Status: None   Collection Time    02/04/14  4:53 AM      Result Value Ref Range   Phosphorus 3.6  2.3 - 4.6 mg/dL  GLUCOSE, CAPILLARY     Status: Abnormal   Collection Time    02/04/14  7:39 AM      Result Value Ref Range   Glucose-Capillary 263 (*) 70 - 99 mg/dL  GLUCOSE, CAPILLARY     Status: Abnormal   Collection Time    02/04/14 11:35 AM      Result Value Ref Range   Glucose-Capillary 261 (*) 70 - 99 mg/dL  GLUCOSE, CAPILLARY     Status: Abnormal   Collection Time    02/04/14  4:46 PM      Result Value Ref Range   Glucose-Capillary 177 (*) 70 - 99 mg/dL  GLUCOSE, CAPILLARY     Status: Abnormal   Collection Time    02/04/14  9:22 PM      Result Value Ref Range   Glucose-Capillary 289 (*) 70 - 99 mg/dL  BASIC METABOLIC PANEL     Status: Abnormal   Collection Time    02/05/14  4:26 AM      Result Value Ref Range   Sodium 131  (*) 137 - 147 mEq/L   Potassium 4.4  3.7 - 5.3 mEq/L   Chloride 101  96 - 112 mEq/L   CO2 20  19 - 32 mEq/L   Glucose, Bld 539 (*) 70 - 99 mg/dL   BUN 19  6 - 23 mg/dL   Creatinine, Ser 1.84 (*) 0.50 - 1.10 mg/dL   Calcium 7.0 (*) 8.4 - 10.5 mg/dL   GFR calc non Af Amer 32 (*) >90 mL/min   GFR calc Af Amer 37 (*) >90 mL/min   Comment: (NOTE)     The eGFR has been calculated using the CKD EPI equation.     This calculation has not been validated in all clinical situations.     eGFR's persistently <90 mL/min signify possible Chronic Kidney     Disease.   Anion gap 10  5 - 15  CBC     Status: Abnormal   Collection Time    02/05/14  4:26 AM      Result Value Ref Range   WBC 2.8 (*) 4.0 - 10.5 K/uL   RBC 3.55 (*) 3.87 - 5.11 MIL/uL   Hemoglobin 9.8 (*) 12.0 - 15.0 g/dL   HCT 30.3 (*) 36.0 - 46.0 %   MCV 85.4  78.0 - 100.0  fL   MCH 27.6  26.0 - 34.0 pg   MCHC 32.3  30.0 - 36.0 g/dL   RDW 15.0  11.5 - 15.5 %   Platelets 208  150 - 400 K/uL  MAGNESIUM     Status: None   Collection Time    02/05/14  4:26 AM      Result Value Ref Range   Magnesium 1.8  1.5 - 2.5 mg/dL  GLUCOSE, CAPILLARY     Status: Abnormal   Collection Time    02/05/14  7:34 AM      Result Value Ref Range   Glucose-Capillary 378 (*) 70 - 99 mg/dL   Comment 1 Documented in Chart     Comment 2 Notify RN    GLUCOSE, CAPILLARY     Status: Abnormal   Collection Time    02/05/14 11:45 AM      Result Value Ref Range   Glucose-Capillary 226 (*) 70 - 99 mg/dL   Comment 1 Documented in Chart     Comment 2 Notify RN    GLUCOSE, CAPILLARY     Status: Abnormal   Collection Time    02/05/14  4:59 PM      Result Value Ref Range   Glucose-Capillary 161 (*) 70 - 99 mg/dL  GLUCOSE, CAPILLARY     Status: Abnormal   Collection Time    02/05/14 10:22 PM      Result Value Ref Range   Glucose-Capillary 140 (*) 70 - 99 mg/dL  MRSA PCR SCREENING     Status: None   Collection Time    02/06/14  3:04 AM      Result Value Ref  Range   MRSA by PCR NEGATIVE  NEGATIVE   Comment:            The GeneXpert MRSA Assay (FDA     approved for NASAL specimens     only), is one component of a     comprehensive MRSA colonization     surveillance program. It is not     intended to diagnose MRSA     infection nor to guide or     monitor treatment for     MRSA infections.  BASIC METABOLIC PANEL     Status: Abnormal   Collection Time    02/06/14  4:40 AM      Result Value Ref Range   Sodium 131 (*) 137 - 147 mEq/L   Potassium 4.2  3.7 - 5.3 mEq/L   Chloride 101  96 - 112 mEq/L   CO2 18 (*) 19 - 32 mEq/L   Glucose, Bld 312 (*) 70 - 99 mg/dL   BUN 20  6 - 23 mg/dL   Creatinine, Ser 1.73 (*) 0.50 - 1.10 mg/dL   Calcium 7.1 (*) 8.4 - 10.5 mg/dL   GFR calc non Af Amer 34 (*) >90 mL/min   GFR calc Af Amer 39 (*) >90 mL/min   Comment: (NOTE)     The eGFR has been calculated using the CKD EPI equation.     This calculation has not been validated in all clinical situations.     eGFR's persistently <90 mL/min signify possible Chronic Kidney     Disease.   Anion gap 12  5 - 15  CBC     Status: Abnormal   Collection Time    02/06/14  4:40 AM      Result Value Ref Range   WBC 3.5 (*) 4.0 - 10.5 K/uL  RBC 3.38 (*) 3.87 - 5.11 MIL/uL   Hemoglobin 9.4 (*) 12.0 - 15.0 g/dL   HCT 79.1 (*) 05.8 - 61.0 %   MCV 84.0  78.0 - 100.0 fL   MCH 27.8  26.0 - 34.0 pg   MCHC 33.1  30.0 - 36.0 g/dL   RDW 04.2  90.6 - 99.1 %   Platelets 191  150 - 400 K/uL  GLUCOSE, CAPILLARY     Status: Abnormal   Collection Time    02/06/14 11:28 AM      Result Value Ref Range   Glucose-Capillary 334 (*) 70 - 99 mg/dL   Labs are reviewed and are pertinent for.  Current Facility-Administered Medications  Medication Dose Route Frequency Provider Last Rate Last Dose  . 0.9 %  sodium chloride infusion  250 mL Intravenous PRN Ron Parker, MD      . acetaminophen (TYLENOL) tablet 650 mg  650 mg Oral Q6H PRN Ron Parker, MD   650 mg at  02/05/14 2232   Or  . acetaminophen (TYLENOL) suppository 650 mg  650 mg Rectal Q6H PRN Ron Parker, MD      . amLODipine (NORVASC) tablet 10 mg  10 mg Oral Daily Rodolph Bong, MD   10 mg at 02/06/14 1110  . carvedilol (COREG) tablet 25 mg  25 mg Oral BID WC Rodolph Bong, MD   25 mg at 02/06/14 0850  . dextrose 50 % solution 25 mL  25 mL Intravenous PRN Loren Racer, MD      . DULoxetine (CYMBALTA) DR capsule 60 mg  60 mg Oral Daily Rodolph Bong, MD   60 mg at 02/06/14 1110  . enoxaparin (LOVENOX) injection 40 mg  40 mg Subcutaneous Q24H Randall K Absher, RPH   40 mg at 02/06/14 1109  . folic acid (FOLVITE) tablet 0.5 mg  0.5 mg Oral Daily Ramiro Harvest V, MD   0.5 mg at 02/06/14 1110  . gi cocktail (Maalox,Lidocaine,Donnatal)  30 mL Oral TID PRN Rodolph Bong, MD   30 mL at 02/05/14 0802  . hydrALAZINE (APRESOLINE) injection 10 mg  10 mg Intravenous Q6H PRN Rodolph Bong, MD   10 mg at 02/05/14 0446  . hydrALAZINE (APRESOLINE) tablet 25 mg  25 mg Oral 3 times per day Rodolph Bong, MD   25 mg at 02/06/14 0534  . HYDROmorphone (DILAUDID) injection 0.5-1 mg  0.5-1 mg Intravenous Q3H PRN Ron Parker, MD   1 mg at 02/05/14 0540  . insulin aspart (novoLOG) injection 0-15 Units  0-15 Units Subcutaneous TID WC Rodolph Bong, MD   11 Units at 02/06/14 1137  . insulin aspart (novoLOG) injection 6 Units  6 Units Subcutaneous TID WC Rodolph Bong, MD   6 Units at 02/06/14 1137  . insulin glargine (LANTUS) injection 30 Units  30 Units Subcutaneous q morning - 10a Rodolph Bong, MD   30 Units at 02/06/14 1111  . iron polysaccharides (NIFEREX) capsule 150 mg  150 mg Oral Daily Rodolph Bong, MD   150 mg at 02/06/14 1110  . levETIRAcetam (KEPPRA) tablet 500 mg  500 mg Oral BID Rodolph Bong, MD   500 mg at 02/06/14 1110  . LORazepam (ATIVAN) injection 1 mg  1 mg Intravenous Q4H PRN Ron Parker, MD   1 mg at 02/03/14 2239  . ondansetron  (ZOFRAN) tablet 4 mg  4 mg Oral Q6H PRN Ron Parker, MD  Or  . ondansetron (ZOFRAN) injection 4 mg  4 mg Intravenous Q6H PRN Theressa Millard, MD      . pantoprazole (PROTONIX) EC tablet 40 mg  40 mg Oral Daily Eugenie Filler, MD   40 mg at 02/06/14 1110  . polyethylene glycol (MIRALAX / GLYCOLAX) packet 17 g  17 g Oral BID Eugenie Filler, MD   17 g at 02/06/14 1109  . pregabalin (LYRICA) capsule 75 mg  75 mg Oral BID Eugenie Filler, MD   75 mg at 02/06/14 1109  . sodium bicarbonate tablet 650 mg  650 mg Oral BID Eugenie Filler, MD   650 mg at 02/06/14 1110  . sodium chloride 0.9 % injection 3 mL  3 mL Intravenous Q12H Theressa Millard, MD   3 mL at 02/04/14 1055  . sodium chloride 0.9 % injection 3 mL  3 mL Intravenous PRN Theressa Millard, MD        Psychiatric Specialty Exam: Physical Exam  ROS  Blood pressure 147/92, pulse 85, temperature 97.5 F (36.4 C), temperature source Oral, resp. rate 20, height $RemoveBe'5\' 6"'bArUqOkaQ$  (1.676 m), weight 78.7 kg (173 lb 8 oz), SpO2 100.00%.Body mass index is 28.02 kg/(m^2).  General Appearance: Guarded  Eye Contact::  Good  Speech:  Clear and Coherent  Volume:  Decreased  Mood:  Depressed  Affect:  Appropriate and Congruent  Thought Process:  Coherent and Goal Directed  Orientation:  Full (Time, Place, and Person)  Thought Content:  WDL  Suicidal Thoughts:  No  Homicidal Thoughts:  No  Memory:  Immediate;   Fair  Judgement:  Intact  Insight:  Fair  Psychomotor Activity:  Psychomotor Retardation  Concentration:  Fair  Recall:  [name] of Knowledge:Fair  Language: Good  Akathisia:  NA  Handed:  Right  AIMS (if indicated):     Assets:  Communication Skills Desire for Improvement Housing Intimacy Leisure Time Physical Health Resilience Social Support  Sleep:      Musculoskeletal: Strength & Muscle Tone: within normal limits Gait & Station: normal Patient leans: N/A  Treatment Plan Summary: Daily contact with  patient to assess and evaluate symptoms and progress in treatment Medication management  Leiliana Foody,JANARDHAHA R. 02/06/2014 1:07 PM

## 2014-02-07 LAB — GLUCOSE, CAPILLARY
GLUCOSE-CAPILLARY: 103 mg/dL — AB (ref 70–99)
Glucose-Capillary: 384 mg/dL — ABNORMAL HIGH (ref 70–99)
Glucose-Capillary: 344 mg/dL — ABNORMAL HIGH (ref 70–99)
Glucose-Capillary: 196 mg/dL — ABNORMAL HIGH (ref 70–99)
Glucose-Capillary: 84 mg/dL (ref 70–99)

## 2014-02-07 LAB — BASIC METABOLIC PANEL
Anion gap: 9 (ref 5–15)
BUN: 25 mg/dL — ABNORMAL HIGH (ref 6–23)
CALCIUM: 7.2 mg/dL — AB (ref 8.4–10.5)
CO2: 21 mEq/L (ref 19–32)
CREATININE: 2.12 mg/dL — AB (ref 0.50–1.10)
Chloride: 102 mEq/L (ref 96–112)
GFR, EST AFRICAN AMERICAN: 31 mL/min — AB (ref >90)
GFR, EST NON AFRICAN AMERICAN: 27 mL/min — AB (ref >90)
Glucose, Bld: 353 mg/dL — ABNORMAL HIGH (ref 70–99)
Potassium: 5 mEq/L (ref 3.7–5.3)
Sodium: 132 mEq/L — ABNORMAL LOW (ref 137–147)

## 2014-02-07 MED ORDER — SODIUM CHLORIDE 0.9 % IV SOLN: 250.0000 mL | INTRAVENOUS | Status: DC | PRN

## 2014-02-07 MED ORDER — SODIUM CHLORIDE 0.9 % IV SOLN
250.0000 mL | INTRAVENOUS | Status: DC
  Administered 2014-02-07: 250 mL via INTRAVENOUS
  Administered 2014-02-07: 15:00:00 via INTRAVENOUS
  Administered 2014-02-08: 1000 mL via INTRAVENOUS

## 2014-02-07 MED ORDER — INSULIN GLARGINE 100 UNIT/ML ~~LOC~~ SOLN
45.0000 [IU] | Freq: Every morning | SUBCUTANEOUS | Status: DC
  Administered 2014-02-08 – 2014-02-09 (×2): 45 [IU] via SUBCUTANEOUS
  Filled 2014-02-07 (×2): qty 0.45

## 2014-02-07 MED ORDER — SODIUM CHLORIDE 0.9 % IV BOLUS (SEPSIS)
500.0000 mL | Freq: Once | INTRAVENOUS | Status: AC
  Administered 2014-02-07: 500 mL via INTRAVENOUS

## 2014-02-07 MED ORDER — INSULIN GLARGINE 100 UNIT/ML ~~LOC~~ SOLN
10.0000 [IU] | Freq: Once | SUBCUTANEOUS | Status: AC
  Administered 2014-02-07: 10 [IU] via SUBCUTANEOUS
  Filled 2014-02-07: qty 0.1

## 2014-02-07 NOTE — Consult Note (Signed)
[facility] Face-to-Face Psychiatry Consult   Reason for Consult:  Bipolar disorder Referring Physician:  Dr. [name] is an 48 y.o. female. Total Time spent with patient: 45 minutes  Assessment: AXIS I:  Schizoaffective Disorder and Substance Abuse AXIS II:  Deferred AXIS III:   Past Medical History  Diagnosis Date  . Neuropathy   . Glaucoma   . Bipolar 1 disorder   . CHF (congestive heart failure)   . Refusal of blood transfusions as patient is Jehovah's Witness   . Hypertension   . Shortness of breath   . Diabetes mellitus     INSULIN DEPENDENT  . TIA (transient ischemic attack)   . Neuromuscular disorder     DIABETIC NEUROPATHY  . Chronic kidney disease   . Hepatitis C virus   . Seizure     diagnosed 2014  . MRSA infection   . Bipolar disorder, unspecified 02/06/2014   AXIS IV:  other psychosocial or environmental problems, problems related to social environment and problems with primary support group AXIS V:  41-50 serious symptoms  Plan:  No evidence of imminent risk to self or others at present.   Patient does not meet criteria for psychiatric inpatient admission. Supportive therapy provided about ongoing stressors. Discussed crisis plan, support from social network, calling 911, coming to the Emergency Department, and calling Suicide Hotline. Provide Symbyax 6/25 mg daily at bedtime and decrease Cymbalta to 30 mg daily. Appreciate psychiatric consultation Please contact [phone] if needs further assistance  Subjective:   HYE TRAWICK is a 48 y.o. female patient admitted with bipolar and s/p seizure.  HPI: Patient is seen, chart reviewed and case discussed with the staff RN, paged Dr. Grandville Silos. Patient reported she has been suffering with auditory and visual hallucinations, she described her auditory hallucinations are chaotic moistens bothering her, paranoid delusions, says her sister is watching her when she is watching TV and also reported people are  talking about her, unable to sleep most of the nights and also reported unable to tolerate her new psychiatric medication Latuda secondary to swelling her face . Patient reportedly not able to tolerate other antipsychotic medication risperidone and Seroquel. Patient reportedly never taken medications Zyprexa Symbyax. Patient has been living with her daughter, daughters friend and her children. patient has history of seizures in the past and presented with report status post seizure episode. Patient also reported she has been taking Adderall for the last 14 years which was started [place], [place] and she was relocated to [place] in the month of April 2015. Patient denied suicidal or homicidal ideation, intentions or plans. Patient was seen [facility] mental health but does not like treatment is helping her and requesting to be referred to the different outpatient treatment center. Patient has a history of substance abuse.  Interval history: Patient complained double vision, tired and somewhat sleepy during this morning. Patient reported slept about and appreciated medication change. Patient has been adjusting her medication changes and denied current paranoia and hallucinations. Patient has no extrapyramidal symptoms. Patient denied suicidal or homicidal ideations, intentions or plans.   Medical history: DASANI THURLOW is a 48 y.o. female with a history of Seizures, IDDM, HTN, CHF, and Bipolar Disorder who was brought to the ED after her daughter witnessed her having a seizure. She arrived to the ED and was post -Ictal with Blood pressures measuring 220/120. She was given Labetalol and had transient improvement but her blood pressure rebounded back into the 010'U systolic.  She was then placed on a Cardene drip for blood pressure control. Her lab studies revealed a glucose levelof 669 and she was placed on the glucose stabilizer. She was referred for medical admission. Her son is at the bedside and he is  assisting with the medical history.   HPI Elements:   Location:  Psychosis. Quality:  Poor. Severity:  Moderate. Timing:  Status post seizure.  Past Psychiatric History: Past Medical History  Diagnosis Date  . Neuropathy   . Glaucoma   . Bipolar 1 disorder   . CHF (congestive heart failure)   . Refusal of blood transfusions as patient is Jehovah's Witness   . Hypertension   . Shortness of breath   . Diabetes mellitus     INSULIN DEPENDENT  . TIA (transient ischemic attack)   . Neuromuscular disorder     DIABETIC NEUROPATHY  . Chronic kidney disease   . Hepatitis C virus   . Seizure     diagnosed 2014  . MRSA infection   . Bipolar disorder, unspecified 02/06/2014    reports that she has been smoking Cigarettes.  She has a 7.5 pack-year smoking history. She has never used smokeless tobacco. She reports that she drinks alcohol. She reports that she does not use illicit drugs. Family History  Problem Relation Age of Onset  . Hypertension Mother   . Hypertension Father      Living Arrangements: Children   Abuse/Neglect [facility]) Physical Abuse: Denies Verbal Abuse: Denies Sexual Abuse: Denies Allergies:   Allergies  Allergen Reactions  . Gabapentin Itching and Swelling  . Saphris [Asenapine] Swelling    Swelling of the face  . Tramadol     Causes legs to swell  . Penicillins Hives    ACT Assessment Complete:  No  Objective: Blood pressure 98/56, pulse 90, temperature 97.6 F (36.4 C), temperature source Oral, resp. rate 18, height $RemoveBe'5\' 6"'pAvOXAwEe$  (1.676 m), weight 78.7 kg (173 lb 8 oz), SpO2 98.00%.Body mass index is 28.02 kg/(m^2). Results for orders placed during the hospital encounter of 02/01/14 (from the past 72 hour(s))  GLUCOSE, CAPILLARY     Status: Abnormal   Collection Time    02/04/14  4:46 PM      Result Value Ref Range   Glucose-Capillary 177 (*) 70 - 99 mg/dL  GLUCOSE, CAPILLARY     Status: Abnormal   Collection Time    02/04/14  9:22 PM      Result Value  Ref Range   Glucose-Capillary 289 (*) 70 - 99 mg/dL  BASIC METABOLIC PANEL     Status: Abnormal   Collection Time    02/05/14  4:26 AM      Result Value Ref Range   Sodium 131 (*) 137 - 147 mEq/L   Potassium 4.4  3.7 - 5.3 mEq/L   Chloride 101  96 - 112 mEq/L   CO2 20  19 - 32 mEq/L   Glucose, Bld 539 (*) 70 - 99 mg/dL   BUN 19  6 - 23 mg/dL   Creatinine, Ser 1.84 (*) 0.50 - 1.10 mg/dL   Calcium 7.0 (*) 8.4 - 10.5 mg/dL   GFR calc non Af Amer 32 (*) >90 mL/min   GFR calc Af Amer 37 (*) >90 mL/min   Comment: (NOTE)     The eGFR has been calculated using the CKD EPI equation.     This calculation has not been validated in all clinical situations.     eGFR's persistently <90 mL/min  signify possible Chronic Kidney     Disease.   Anion gap 10  5 - 15  CBC     Status: Abnormal   Collection Time    02/05/14  4:26 AM      Result Value Ref Range   WBC 2.8 (*) 4.0 - 10.5 K/uL   RBC 3.55 (*) 3.87 - 5.11 MIL/uL   Hemoglobin 9.8 (*) 12.0 - 15.0 g/dL   HCT 30.3 (*) 36.0 - 46.0 %   MCV 85.4  78.0 - 100.0 fL   MCH 27.6  26.0 - 34.0 pg   MCHC 32.3  30.0 - 36.0 g/dL   RDW 15.0  11.5 - 15.5 %   Platelets 208  150 - 400 K/uL  MAGNESIUM     Status: None   Collection Time    02/05/14  4:26 AM      Result Value Ref Range   Magnesium 1.8  1.5 - 2.5 mg/dL  GLUCOSE, CAPILLARY     Status: Abnormal   Collection Time    02/05/14  7:34 AM      Result Value Ref Range   Glucose-Capillary 378 (*) 70 - 99 mg/dL   Comment 1 Documented in Chart     Comment 2 Notify RN    GLUCOSE, CAPILLARY     Status: Abnormal   Collection Time    02/05/14 11:45 AM      Result Value Ref Range   Glucose-Capillary 226 (*) 70 - 99 mg/dL   Comment 1 Documented in Chart     Comment 2 Notify RN    GLUCOSE, CAPILLARY     Status: Abnormal   Collection Time    02/05/14  4:59 PM      Result Value Ref Range   Glucose-Capillary 161 (*) 70 - 99 mg/dL  GLUCOSE, CAPILLARY     Status: Abnormal   Collection Time    02/05/14  10:22 PM      Result Value Ref Range   Glucose-Capillary 140 (*) 70 - 99 mg/dL  MRSA PCR SCREENING     Status: None   Collection Time    02/06/14  3:04 AM      Result Value Ref Range   MRSA by PCR NEGATIVE  NEGATIVE   Comment:            The GeneXpert MRSA Assay (FDA     approved for NASAL specimens     only), is one component of a     comprehensive MRSA colonization     surveillance program. It is not     intended to diagnose MRSA     infection nor to guide or     monitor treatment for     MRSA infections.  BASIC METABOLIC PANEL     Status: Abnormal   Collection Time    02/06/14  4:40 AM      Result Value Ref Range   Sodium 131 (*) 137 - 147 mEq/L   Potassium 4.2  3.7 - 5.3 mEq/L   Chloride 101  96 - 112 mEq/L   CO2 18 (*) 19 - 32 mEq/L   Glucose, Bld 312 (*) 70 - 99 mg/dL   BUN 20  6 - 23 mg/dL   Creatinine, Ser 1.73 (*) 0.50 - 1.10 mg/dL   Calcium 7.1 (*) 8.4 - 10.5 mg/dL   GFR calc non Af Amer 34 (*) >90 mL/min   GFR calc Af Amer 39 (*) >90 mL/min   Comment: (NOTE)  The eGFR has been calculated using the CKD EPI equation.     This calculation has not been validated in all clinical situations.     eGFR's persistently <90 mL/min signify possible Chronic Kidney     Disease.   Anion gap 12  5 - 15  CBC     Status: Abnormal   Collection Time    02/06/14  4:40 AM      Result Value Ref Range   WBC 3.5 (*) 4.0 - 10.5 K/uL   RBC 3.38 (*) 3.87 - 5.11 MIL/uL   Hemoglobin 9.4 (*) 12.0 - 15.0 g/dL   HCT 28.4 (*) 36.0 - 46.0 %   MCV 84.0  78.0 - 100.0 fL   MCH 27.8  26.0 - 34.0 pg   MCHC 33.1  30.0 - 36.0 g/dL   RDW 14.7  11.5 - 15.5 %   Platelets 191  150 - 400 K/uL  GLUCOSE, CAPILLARY     Status: Abnormal   Collection Time    02/06/14 11:28 AM      Result Value Ref Range   Glucose-Capillary 334 (*) 70 - 99 mg/dL  GLUCOSE, CAPILLARY     Status: Abnormal   Collection Time    02/06/14  5:08 PM      Result Value Ref Range   Glucose-Capillary 205 (*) 70 - 99 mg/dL   GLUCOSE, CAPILLARY     Status: Abnormal   Collection Time    02/06/14  9:16 PM      Result Value Ref Range   Glucose-Capillary 159 (*) 70 - 99 mg/dL   Comment 1 Notify RN    BASIC METABOLIC PANEL     Status: Abnormal   Collection Time    02/07/14  4:59 AM      Result Value Ref Range   Sodium 132 (*) 137 - 147 mEq/L   Potassium 5.0  3.7 - 5.3 mEq/L   Chloride 102  96 - 112 mEq/L   CO2 21  19 - 32 mEq/L   Glucose, Bld 353 (*) 70 - 99 mg/dL   BUN 25 (*) 6 - 23 mg/dL   Creatinine, Ser 2.12 (*) 0.50 - 1.10 mg/dL   Calcium 7.2 (*) 8.4 - 10.5 mg/dL   GFR calc non Af Amer 27 (*) >90 mL/min   GFR calc Af Amer 31 (*) >90 mL/min   Comment: (NOTE)     The eGFR has been calculated using the CKD EPI equation.     This calculation has not been validated in all clinical situations.     eGFR's persistently <90 mL/min signify possible Chronic Kidney     Disease.   Anion gap 9  5 - 15  GLUCOSE, CAPILLARY     Status: Abnormal   Collection Time    02/07/14  7:34 AM      Result Value Ref Range   Glucose-Capillary 344 (*) 70 - 99 mg/dL   Comment 1 Documented in Chart     Comment 2 Notify RN    GLUCOSE, CAPILLARY     Status: Abnormal   Collection Time    02/07/14 11:41 AM      Result Value Ref Range   Glucose-Capillary 196 (*) 70 - 99 mg/dL   Labs are reviewed and are pertinent for.  Current Facility-Administered Medications  Medication Dose Route Frequency Provider Last Rate Last Dose  . 0.9 %  sodium chloride infusion  250 mL Intravenous PRN Theressa Millard, MD      .  acetaminophen (TYLENOL) tablet 650 mg  650 mg Oral Q6H PRN Theressa Millard, MD   650 mg at 02/05/14 2232   Or  . acetaminophen (TYLENOL) suppository 650 mg  650 mg Rectal Q6H PRN Theressa Millard, MD      . amLODipine (NORVASC) tablet 10 mg  10 mg Oral Daily Eugenie Filler, MD   10 mg at 02/06/14 1110  . carvedilol (COREG) tablet 25 mg  25 mg Oral BID WC Eugenie Filler, MD   25 mg at 02/07/14 0755  . dextrose  50 % solution 25 mL  25 mL Intravenous PRN Julianne Rice, MD      . DULoxetine (CYMBALTA) DR capsule 30 mg  30 mg Oral Daily Durward Parcel, MD   30 mg at 02/07/14 1110  . enoxaparin (LOVENOX) injection 40 mg  40 mg Subcutaneous Q24H Randall K Absher, RPH   40 mg at 02/07/14 1110  . folic acid (FOLVITE) tablet 0.5 mg  0.5 mg Oral Daily Eugenie Filler, MD   0.5 mg at 02/07/14 1109  . gi cocktail (Maalox,Lidocaine,Donnatal)  30 mL Oral TID PRN Eugenie Filler, MD   30 mL at 02/05/14 0802  . hydrALAZINE (APRESOLINE) injection 10 mg  10 mg Intravenous Q6H PRN Eugenie Filler, MD   10 mg at 02/05/14 0446  . hydrALAZINE (APRESOLINE) tablet 25 mg  25 mg Oral 3 times per day Eugenie Filler, MD   25 mg at 02/07/14 0631  . HYDROmorphone (DILAUDID) injection 0.5-1 mg  0.5-1 mg Intravenous Q3H PRN Theressa Millard, MD   1 mg at 02/05/14 0540  . insulin aspart (novoLOG) injection 0-15 Units  0-15 Units Subcutaneous TID WC Eugenie Filler, MD   3 Units at 02/07/14 1229  . insulin aspart (novoLOG) injection 6 Units  6 Units Subcutaneous TID WC Eugenie Filler, MD   6 Units at 02/07/14 1230  . [START ON 02/08/2014] insulin glargine (LANTUS) injection 45 Units  45 Units Subcutaneous q morning - 10a Eugenie Filler, MD      . iron polysaccharides (NIFEREX) capsule 150 mg  150 mg Oral Daily Eugenie Filler, MD   150 mg at 02/07/14 1109  . levETIRAcetam (KEPPRA) tablet 500 mg  500 mg Oral BID Eugenie Filler, MD   500 mg at 02/07/14 1110  . LORazepam (ATIVAN) injection 1 mg  1 mg Intravenous Q4H PRN Theressa Millard, MD   1 mg at 02/03/14 2239  . OLANZapine-FLUoxetine (SYMBYAX) 6-25 MG per capsule 1 capsule  1 capsule Oral QHS Eugenie Filler, MD   1 capsule at 02/06/14 2124  . ondansetron (ZOFRAN) tablet 4 mg  4 mg Oral Q6H PRN Theressa Millard, MD       Or  . ondansetron (ZOFRAN) injection 4 mg  4 mg Intravenous Q6H PRN Theressa Millard, MD      . pantoprazole (PROTONIX) EC  tablet 40 mg  40 mg Oral Daily Eugenie Filler, MD   40 mg at 02/07/14 1110  . polyethylene glycol (MIRALAX / GLYCOLAX) packet 17 g  17 g Oral BID Eugenie Filler, MD   17 g at 02/07/14 1111  . pregabalin (LYRICA) capsule 75 mg  75 mg Oral BID Eugenie Filler, MD   75 mg at 02/07/14 1109  . sodium bicarbonate tablet 650 mg  650 mg Oral BID Eugenie Filler, MD   650 mg at 02/07/14 1109  . sodium  chloride 0.9 % injection 3 mL  3 mL Intravenous Q12H Theressa Millard, MD   3 mL at 02/04/14 1055  . sodium chloride 0.9 % injection 3 mL  3 mL Intravenous PRN Theressa Millard, MD        Psychiatric Specialty Exam: Physical Exam  ROS  Blood pressure 98/56, pulse 90, temperature 97.6 F (36.4 C), temperature source Oral, resp. rate 18, height $RemoveBe'5\' 6"'DjuwbNYCk$  (1.676 m), weight 78.7 kg (173 lb 8 oz), SpO2 98.00%.Body mass index is 28.02 kg/(m^2).  General Appearance: Guarded  Eye Contact::  Good  Speech:  Clear and Coherent  Volume:  Decreased  Mood:  Depressed  Affect:  Appropriate and Congruent  Thought Process:  Coherent and Goal Directed  Orientation:  Full (Time, Place, and Person)  Thought Content:  WDL  Suicidal Thoughts:  No  Homicidal Thoughts:  No  Memory:  Immediate;   Fair  Judgement:  Intact  Insight:  Fair  Psychomotor Activity:  Psychomotor Retardation  Concentration:  Fair  Recall:  Smiley Houseman of Knowledge:Fair  Language: Good  Akathisia:  NA  Handed:  Right  AIMS (if indicated):     Assets:  Communication Skills Desire for Improvement Housing Intimacy Leisure Time Physical Health Resilience Social Support  Sleep:      Musculoskeletal: Strength & Muscle Tone: within normal limits Gait & Station: normal Patient leans: N/A  Treatment Plan Summary: Daily contact with patient to assess and evaluate symptoms and progress in treatment Medication management  Christerpher Clos,JANARDHAHA R. 02/07/2014 12:43 PM

## 2014-02-07 NOTE — Progress Notes (Signed)
Clinical Social Work  CSW received referral to complete psychosocial assessment. CSW went to room and patient laying in bed. Patient reports she is drowsy and does not want to talk with CSW. CSW will follow up at later time to complete full assessment.  [place], [place] [phone]

## 2014-02-07 NOTE — Progress Notes (Signed)
[facility] PROGRESS NOTE  [name] [id number] DOB: 03/06/1966 DOA: 02/01/2014 PCP: Maurice Small, D, MD  Assessment/Plan: #1 breakthrough seizures Likely secondary to noncompliance. Patient stated had not been on the [place] for over a year. No seizures overnight. CT head negative. MRI of the head with no acute abnormalities. No signs or symptoms of infection. Phosphorus level repleted. Continue oral Keppra. PT/OT.   #2 uncontrolled type 2 diabetes with hyperosmolar hyperglycemia Likely secondary to medical noncompliance. Hemoglobin A1c is 12.4 which is an increase from 9.2 last year 02/09/2013. Patient currently off the glucometer. CBGs have ranged from 159 - 334. Increase Lantus to 45 units daily. Continue sliding scale insulin. Continue meal coverage insulin 6 units 3 times daily. Diabetes education. Patient is interested in outpatient diabetes education. Follow.  #3 hypertensive emergency Likely also secondary to medical noncompliance. Continue Coreg 25 mg twice daily, Norvasc 10 mg daily.  #4 acute on chronic kidney disease stage III Likely secondary to a prerenal azotemia. Renal ultrasound negative for hydronephrosis. Follow.  #5 hypophosphatemia Repleted.  #6 iron deficiency anemia/aocd Anemia panel consistent with iron deficiency anemia. No overt GI bleed. H&H is stable. Follow H&H. Transfusion threshold hemoglobin less than 7.  #7 history of bipolar disorder/schizoaffective disorder Patient with some complaints of visual hallucinations. Patient has been seen by psychiatry and patient Cymbalta dose was decreased to 30 mg daily. Patient was also been started on symbyax. Patient will need outpatient psychiatric followup. Psychiatry following and appreciate input and recommendations.  #8 dizziness Check orthostatics. If orthostatic will hydrate with IV fluids and monitor closely for volume overload.  #9 prophylaxis Lovenox for DVT prophylaxis.    Code Status:  Full Family Communication: Updated patient no family at bedside. Disposition Plan: To skilled nursing facility when medically stable.   Consultants:  Neurology: Dr. Doy Mince 02/02/2014  Psychiatry: Dr. Louretta Shorten 02/06/2014  Procedures:  MRI head 02/02/2014  CT head 02/02/2014  Chest x-ray 02/02/2014  Renal ultrasound 02/02/2014  Antibiotics:  None  HPI/Subjective: No syncopal episodes overnight per nursing. Patient alert and following commands coherent. Patient complaining of some dizziness and feeling fuzzy when she stands up.  Objective: Filed Vitals:   02/07/14 1113  BP: 98/60  Pulse:   Temp:   Resp:     Intake/Output Summary (Last 24 hours) at 02/07/14 1144 Last data filed at 02/06/14 2300  Gross per 24 hour  Intake    840 ml  Output      0 ml  Net    840 ml   Filed Weights   02/02/14 0510 02/03/14 0400 02/03/14 1127  Weight: 73.9 kg (162 lb 14.7 oz) 77.4 kg (170 lb 10.2 oz) 78.7 kg (173 lb 8 oz)    Exam:   General:  NAD  Cardiovascular: RRR  Respiratory: CTAB  Abdomen: Soft, nontender, nondistended, positive bowel sounds.  Musculoskeletal: No clubbing cyanosis or edema. Status post left foot transmetatarsal amputation.  Data Reviewed: Basic Metabolic Panel:  Recent Labs Lab 02/02/14 0630 02/02/14 0818  02/03/14 0400 02/04/14 0453 02/05/14 0426 02/06/14 0440 02/07/14 0459  NA 137 139  < > 134* 136* 131* 131* 132*  K 3.2* 3.3*  < > 3.9 4.0 4.4 4.2 5.0  CL 100 104  < > 104 107 101 101 102  CO2 22 23  < > 21 20 20  18* 21  GLUCOSE 376* 271*  < > 410* 253* 539* 312* 353*  BUN 23 24*  < > 25* 22 19 20  25*  CREATININE 2.17*  2.21*  < > 2.23* 1.95* 1.84* 1.73* 2.12*  CALCIUM 8.5 8.5  < > 7.4* 7.1* 7.0* 7.1* 7.2*  MG  --  1.9  --   --  1.6 1.8  --   --   PHOS  --  2.1*  --   --  3.6  --   --   --   < > = values in this interval not displayed. Liver Function Tests:  Recent Labs Lab 02/02/14 0006  AST 27  ALT 25  ALKPHOS 87   BILITOT 0.2*  PROT 5.9*  ALBUMIN 1.6*   No results found for this basename: LIPASE, AMYLASE,  in the last 168 hours  Recent Labs Lab 02/02/14 0432  AMMONIA 39   CBC:  Recent Labs Lab 02/02/14 0006 02/02/14 0630 02/03/14 0400 02/04/14 0453 02/05/14 0426 02/06/14 0440  WBC 4.5 6.2 4.8 3.4* 2.8* 3.5*  NEUTROABS 3.3  --  2.4  --   --   --   HGB 10.4* 11.0* 9.5* 9.3* 9.8* 9.4*  HCT 30.6* 32.1* 28.1* 27.4* 30.3* 28.4*  MCV 82.7 82.7 83.4 83.8 85.4 84.0  PLT 228 264 248 221 208 191   Cardiac Enzymes:  Recent Labs Lab 02/02/14 0006  TROPONINI <0.30   BNP (last 3 results)  Recent Labs  06/01/13 1619  PROBNP 1210.0*   CBG:  Recent Labs Lab 02/05/14 2222 02/06/14 1128 02/06/14 1708 02/06/14 2116 02/07/14 0734  GLUCAP 140* 334* 205* 159* 344*    Recent Results (from the past 240 hour(s))  MRSA PCR SCREENING     Status: None   Collection Time    02/02/14  5:29 AM      Result Value Ref Range Status   MRSA by PCR NEGATIVE  NEGATIVE Final   Comment:            The GeneXpert MRSA Assay (FDA     approved for NASAL specimens     only), is one component of a     comprehensive MRSA colonization     surveillance program. It is not     intended to diagnose MRSA     infection nor to guide or     monitor treatment for     MRSA infections.     Performed at [facility]     Status: None   Collection Time    02/02/14 12:00 PM      Result Value Ref Range Status   Specimen Description URINE, RANDOM   Final   Special Requests NONE   Final   Culture  Setup Time     Final   Value: 02/02/2014 14:20     Performed at [facility]     Final   Value: NO GROWTH     Performed at [facility]   Culture     Final   Value: NO GROWTH     Performed at [facility]   Report Status 02/03/2014 FINAL   Final  MRSA PCR SCREENING     Status: None   Collection Time    02/06/14  3:04 AM      Result Value Ref Range  Status   MRSA by PCR NEGATIVE  NEGATIVE Final   Comment:            The GeneXpert MRSA Assay (FDA     approved for NASAL specimens     only), is one component of a  comprehensive MRSA colonization     surveillance program. It is not     intended to diagnose MRSA     infection nor to guide or     monitor treatment for     MRSA infections.     Studies: No results found.  Scheduled Meds: . amLODipine  10 mg Oral Daily  . carvedilol  25 mg Oral BID WC  . DULoxetine  30 mg Oral Daily  . enoxaparin (LOVENOX) injection  40 mg Subcutaneous Q24H  . folic acid  0.5 mg Oral Daily  . hydrALAZINE  25 mg Oral 3 times per day  . insulin aspart  0-15 Units Subcutaneous TID WC  . insulin aspart  6 Units Subcutaneous TID WC  . insulin glargine  10 Units Subcutaneous Once  . [START ON 02/08/2014] insulin glargine  45 Units Subcutaneous q morning - 10a  . iron polysaccharides  150 mg Oral Daily  . levETIRAcetam  500 mg Oral BID  . OLANZapine-FLUoxetine  1 capsule Oral QHS  . pantoprazole  40 mg Oral Daily  . polyethylene glycol  17 g Oral BID  . pregabalin  75 mg Oral BID  . sodium bicarbonate  650 mg Oral BID  . sodium chloride  3 mL Intravenous Q12H   Continuous Infusions:    Principal Problem:   Seizures Active Problems:   Hypertensive emergency   Uncontrolled type 2 diabetes mellitus with hyperosmolar nonketotic hyperglycemia   Renal failure (ARF), acute on chronic   DM (diabetes mellitus), type 2, uncontrolled   Hypertension, accelerated   Normocytic anemia   CKD (chronic kidney disease), stage III   Noncompliance   Bipolar disorder, unspecified    Time spent: [place] MD [facility] Pager [phone]. If 7PM-7AM, please contact night-coverage at [email or web address], password [facility] 02/07/2014, 11:44 AM  LOS: 6 days

## 2014-02-07 NOTE — Progress Notes (Addendum)
Inpatient Diabetes Program Recommendations  AACE/ADA: New Consensus Statement on Inpatient Glycemic Control (2013)  Target Ranges:  Prepandial:   less than 140 mg/dL      Peak postprandial:   less than 180 mg/dL (1-2 hours)      Critically ill patients:  140 - 180 mg/dL  Results for KHIYAH, MABEN (MRN [id number]) as of 02/07/2014 09:57  Ref. Range 02/06/2014 11:28 02/06/2014 17:08 02/06/2014 21:16 02/07/2014 07:34  Glucose-Capillary Latest Range: 70-99 mg/dL 334 (H) 205 (H) 159 (H) 344 (H)   Consider increasing Lantus to home dose 45 units.  Will add OP ed referral.  Thank you  Raoul Pitch BSN, RN,CDE Inpatient Diabetes Coordinator [phone] (team pager)

## 2014-02-07 NOTE — Progress Notes (Signed)
PT Cancellation Note  Patient Details Name: [name] MRN: [id number] DOB: 12/14/1965   Cancelled Treatment:    Reason Eval/Treat Not Completed: Other: pt declined to participate with PT at this time-" I can't see straight. Not right now." Will check back another day.    Weston Anna, MPT Pager: [phone]

## 2014-02-07 NOTE — Progress Notes (Signed)
Occupational Therapy Treatment Patient Details Name: [name] MRN: [id number] DOB: 1965-11-12 Today's Date: 02/07/2014    History of present illness 48 yo female admitted with seizure, HTN, hyperglycemia. hx of L midfoot amputation 2014, seizure, neuropathy, bipolar d/o, glaucoma, DM, HTN, Hep C   OT comments  Pt reports new DBL vision this day. RN aware           Precautions / Restrictions Precautions Precautions: Fall Precaution Comments: Seziure Restrictions Weight Bearing Restrictions: No       Mobility Bed Mobility Overal bed mobility: Needs Assistance Bed Mobility: Supine to Sit     Supine to sit: Min assist Sit to supine: Min assist      Transfers                      Balance      good balance sitting EOB                             ADL   Eating/Feeding: Supervision/ safety;Sitting                                     General ADL Comments: Pt did sit EOB for approx 10 min before returning to bed with HOB raised. Pt complaining of double vision but pt was able to read clock and sign in room very quickly.       Vision     Ocular Range of Motion: Within Functional Limits Tracking/Visual Pursuits:  (OT reported findings to MD and RN.)     Diplopia Assessment: Present all the time/all directions              Cognition   Behavior During Therapy: [facility] for tasks assessed/performed Overall Cognitive Status: No family/caregiver present to determine baseline cognitive functioning                               General Comments  pt reported new dbl vision and increased dizziness this OT session    Pertinent Vitals/ Pain       Pt with lower than normal dbl blood pressure            Progress Toward Goals  OT Goals(current goals can now be found in the care plan section)   progressing slowly            End of Session  pt left in bed with sitter present   Activity Tolerance  pt limited by  fatigue   Patient Left  with sitter   Nurse Communication  regarding dbl vision        Time: 1100-1115 OT Time Calculation (min): 15 min  Charges: OT General Charges $OT Visit: 1 Procedure OT Treatments $Therapeutic Activity: 8-22 mins  [name] D 02/07/2014, 11:19 AM

## 2014-02-08 ENCOUNTER — Encounter (HOSPITAL_COMMUNITY): Payer: Self-pay | Admitting: Internal Medicine

## 2014-02-08 DIAGNOSIS — I951 Orthostatic hypotension: Secondary | ICD-10-CM

## 2014-02-08 DIAGNOSIS — G40909 Epilepsy, unspecified, not intractable, without status epilepticus: Principal | ICD-10-CM

## 2014-02-08 DIAGNOSIS — F259 Schizoaffective disorder, unspecified: Secondary | ICD-10-CM

## 2014-02-08 HISTORY — DX: Schizoaffective disorder, unspecified: F25.9

## 2014-02-08 LAB — BASIC METABOLIC PANEL
ANION GAP: 9 (ref 5–15)
Anion gap: 9 (ref 5–15)
BUN: 33 mg/dL — AB (ref 6–23)
BUN: 34 mg/dL — ABNORMAL HIGH (ref 6–23)
CALCIUM: 7.4 mg/dL — AB (ref 8.4–10.5)
CHLORIDE: 111 meq/L (ref 96–112)
CO2: 21 mEq/L (ref 19–32)
CO2: 20 meq/L (ref 19–32)
Calcium: 7.6 mg/dL — ABNORMAL LOW (ref 8.4–10.5)
Chloride: 106 mEq/L (ref 96–112)
Creatinine, Ser: 2.63 mg/dL — ABNORMAL HIGH (ref 0.50–1.10)
Creatinine, Ser: 2.5 mg/dL — ABNORMAL HIGH (ref 0.50–1.10)
GFR calc Af Amer: 24 mL/min — ABNORMAL LOW (ref >90)
GFR calc Af Amer: 25 mL/min — ABNORMAL LOW (ref >90)
GFR calc non Af Amer: 22 mL/min — ABNORMAL LOW (ref >90)
GFR, EST NON AFRICAN AMERICAN: 21 mL/min — AB (ref >90)
GLUCOSE: 155 mg/dL — AB (ref 70–99)
Glucose, Bld: 91 mg/dL (ref 70–99)
POTASSIUM: 4.4 meq/L (ref 3.7–5.3)
Potassium: 4.5 mEq/L (ref 3.7–5.3)
SODIUM: 140 meq/L (ref 137–147)
Sodium: 136 mEq/L — ABNORMAL LOW (ref 137–147)

## 2014-02-08 LAB — CBC
HCT: 26.4 % — ABNORMAL LOW (ref 36.0–46.0)
Hemoglobin: 8.5 g/dL — ABNORMAL LOW (ref 12.0–15.0)
MCH: 28.1 pg (ref 26.0–34.0)
MCHC: 32.2 g/dL (ref 30.0–36.0)
MCV: 87.1 fL (ref 78.0–100.0)
PLATELETS: 167 10*3/uL (ref 150–400)
RBC: 3.03 MIL/uL — ABNORMAL LOW (ref 3.87–5.11)
RDW: 15.4 % (ref 11.5–15.5)
WBC: 3.3 10*3/uL — ABNORMAL LOW (ref 4.0–10.5)

## 2014-02-08 LAB — URINALYSIS, ROUTINE W REFLEX MICROSCOPIC
BILIRUBIN URINE: NEGATIVE
Glucose, UA: 100 mg/dL — AB
KETONES UR: NEGATIVE mg/dL
NITRITE: NEGATIVE
PH: 5.5 (ref 5.0–8.0)
Protein, ur: >300 mg/dL — AB
Specific Gravity, Urine: 1.014 (ref 1.005–1.030)
Urobilinogen, UA: 0.2 mg/dL (ref 0.0–1.0)

## 2014-02-08 LAB — URINE MICROSCOPIC-ADD ON

## 2014-02-08 LAB — GLUCOSE, CAPILLARY
GLUCOSE-CAPILLARY: 150 mg/dL — AB (ref 70–99)
Glucose-Capillary: 128 mg/dL — ABNORMAL HIGH (ref 70–99)
Glucose-Capillary: 122 mg/dL — ABNORMAL HIGH (ref 70–99)

## 2014-02-08 LAB — CREATININE, URINE, RANDOM: CREATININE, URINE: 104.2 mg/dL

## 2014-02-08 LAB — SODIUM, URINE, RANDOM: Sodium, Ur: 28 mEq/L

## 2014-02-08 MED ORDER — SODIUM CHLORIDE 0.9 % IV BOLUS (SEPSIS)
500.0000 mL | Freq: Once | INTRAVENOUS | Status: AC
  Administered 2014-02-08: 500 mL via INTRAVENOUS

## 2014-02-08 MED ORDER — DULOXETINE HCL 60 MG PO CPEP
60.0000 mg | ORAL_CAPSULE | Freq: Every day | ORAL | Status: DC
  Administered 2014-02-09 – 2014-02-10 (×2): 60 mg via ORAL
  Filled 2014-02-08 (×2): qty 1

## 2014-02-08 MED ORDER — SODIUM CHLORIDE 0.9 % IV SOLN
250.0000 mL | INTRAVENOUS | Status: DC
  Administered 2014-02-09: 1000 mL via INTRAVENOUS

## 2014-02-08 MED ORDER — OLANZAPINE 5 MG PO TBDP
5.0000 mg | ORAL_TABLET | Freq: Every day | ORAL | Status: DC
  Administered 2014-02-08 – 2014-02-09 (×2): 5 mg via ORAL
  Filled 2014-02-08 (×3): qty 1

## 2014-02-08 NOTE — Progress Notes (Signed)
Clinical Social Work Department CLINICAL SOCIAL WORK PSYCHIATRY SERVICE LINE ASSESSMENT 02/08/2014  Patient:  [name]  Account:  [id number]  Admit Date:  02/01/2014  Clinical Social Worker:  Sindy Messing, LCSW  Date/Time:  02/08/2014 09:45 AM Referred by:  Physician  Date referred:  02/08/2014 Reason for Referral  Psychosocial assessment   Presenting Symptoms/Problems (In the person's/family's own words):   Psych consulted to complete psychosocial assessment.   Abuse/Neglect/Trauma History (check all that apply)  Denies history   Abuse/Neglect/Trauma Comments:   Psychiatric History (check all that apply)  Outpatient treatment  Inpatient/hospitilization   Psychiatric medications:  Cymbalta 30 mg   Current Mental Health Hospitalizations/Previous Mental Health History:   Patient reports she was diagnosed with bipolar disorder several years ago. Patient reports that she has been receiving medication management through PCP currently but would consider outpatient follow up with a psychiatrist.   Current provider:   PCP   Place and Date:   [place], [place]   Current Medications:   Scheduled Meds:      . amLODipine  10 mg Oral Daily  . carvedilol  25 mg Oral BID WC  . DULoxetine  30 mg Oral Daily  . enoxaparin (LOVENOX) injection  40 mg Subcutaneous Q24H  . folic acid  0.5 mg Oral Daily  . insulin aspart  0-15 Units Subcutaneous TID WC  . insulin aspart  6 Units Subcutaneous TID WC  . insulin glargine  45 Units Subcutaneous q morning - 10a  . iron polysaccharides  150 mg Oral Daily  . levETIRAcetam  500 mg Oral BID  . pantoprazole  40 mg Oral Daily  . polyethylene glycol  17 g Oral BID  . pregabalin  75 mg Oral BID  . sodium bicarbonate  650 mg Oral BID  . sodium chloride  3 mL Intravenous Q12H        Continuous Infusions:      . sodium chloride 250 mL (02/07/14 2113)          PRN Meds:.acetaminophen, acetaminophen, gi cocktail, hydrALAZINE, HYDROmorphone (DILAUDID)  injection, LORazepam, ondansetron (ZOFRAN) IV, ondansetron, sodium chloride       Previous Impatient Admission/Date/Reason:   Patient reports she was hospitalized about 14 years ago due to suicide attempt.   Emotional Health / Current Symptoms    Suicide/Self Harm  Suicide attempt in past (date/description)   Suicide attempt in the past:   Patient has attempted suicide twice in her lifetime. Patient's last attempt was 14 years ago. Patient denies any SI or HI.   Other harmful behavior:   None reported   Psychotic/Dissociative Symptoms  Auditory Hallucinations  Visual Hallucinations   Other Psychotic/Dissociative Symptoms:   Patient reports after seizure at home she began experiencing AH and VH. Patient has been seeing people and Jesus but is unable to understand what the voices are saying to her.    Attention/Behavioral Symptoms  Other - See comment   Other Attention / Behavioral Symptoms:   Patient drowsy throughout assessment.    Cognitive Impairment  Within Normal Limits   Other Cognitive Impairment:   Patient oriented x4.    Mood and Adjustment  Lethargic    Stress, Anxiety, Trauma, Any Recent Loss/Stressor  None reported   Anxiety (frequency):   N/A   Phobia (specify):   N/A   Compulsive behavior (specify):   N/A   Obsessive behavior (specify):   N/A   Other:   N/A   Substance Abuse/Use  None   SBIRT completed (  please refer for detailed history):  N  Self-reported substance use:   Patient denies all substance use and does not desire to complete SBIRT.   Urinary Drug Screen Completed:  Y Alcohol level:   <11    Environmental/Housing/Living Arrangement  Stable housing   Who is in the home:   Dtr   Emergency contact:  Ambulance person  Medicare  Medicaid   Patient's Strengths and Goals (patient's own words):   Patient reports her family is supportive.   Clinical Social Worker's Interpretive Summary:   CSW received  referral in order to complete psychosocial assessment. CSW reviewed chart and met with patient at bedside. CSW introduced myself and explained role.    Patient reports she recently moved back to [place] and is currently living with her dtr. Patient has two sons as well. Patient reports she has been disabled since 2007 and unable to work. Patient reports that she plans to DC from the hospital to SNF in order to get rehab prior to returning home.    Patient reports she was diagnosed with bipolar disorder in the past but has not been seeing a psychiatrist lately. Patient reports that she would be interested in outpatient follow up if she liked the psychiatrist. Patient reports that she started experiencing hallucinations after seizure. Patient continues to endorse hallucinations but is unable to provide further detail. Patient reports that she is not worried about her hallucinations but is worried that her medication is making her too drowsy. Patient reports she slept well last night but does not feel she wake up this morning.    Patient denies any SI or HI. Patient denies any substance use. Patient somewhat disengaged during assessment due to feeling drowsy and CSW has to encourage patient to participate. Patient is agreeable to continue medication if it can be adjusted so that she function during the day.    CSW will continue to follow to provide support during hospitalization.   Disposition:  Recommend Psych CSW continuing to support while in hospital   [place], [place] [phone]

## 2014-02-08 NOTE — Progress Notes (Signed)
Received report on patient at 2300 from [place], [place]. Agree with her assessment. Will continue to monitor patient.

## 2014-02-08 NOTE — Progress Notes (Signed)
[facility] PROGRESS NOTE  [name] [id number] DOB: March 06, 1966 DOA: 02/01/2014 PCP: Maurice Small, D, MD  Assessment/Plan: #1 breakthrough seizures Likely secondary to noncompliance. Patient stated had not been on the [place] for over a year. No seizures overnight. CT head negative. MRI of the head with no acute abnormalities. No signs or symptoms of infection. Phosphorus level repleted. Continue oral Keppra. PT/OT.   #2 uncontrolled type 2 diabetes with hyperosmolar hyperglycemia Likely secondary to medical noncompliance. Hemoglobin A1c is 12.4 which is an increase from 9.2 last year 02/09/2013. Patient currently off the glucometer. CBGs have ranged from 103 - 150. Continue Lantus 45 units daily. Continue sliding scale insulin. Continue meal coverage insulin 6 units 3 times daily. Diabetes education. Patient is interested in outpatient diabetes education. Follow.  #3 hypertensive emergency Likely also secondary to medical noncompliance. Continue Coreg 25 mg twice daily, Norvasc 10 mg daily.  #4 acute on chronic kidney disease stage III Likely secondary to a prerenal azotemia. Renal ultrasound negative for hydronephrosis. Patient noted to be orthostatic. Renal function worsening. Will discontinue Symbyax. Hydrate with IV fluids. Monitor closely for volume overload. If no improvement her renal function may need a renal consultation.   #5 hypophosphatemia Repleted.  #6 iron deficiency anemia/aocd Anemia panel consistent with iron deficiency anemia. No overt GI bleed. H&H is stable. Follow H&H. Transfusion threshold hemoglobin less than 7.  #7 history of bipolar disorder/schizoaffective disorder Patient with some complaints of visual hallucinations. Patient has been seen by psychiatry and patient Cymbalta dose was decreased to 30 mg daily. Symbyax has been discontinued secondary to orthostasis. Patient has been started on low-dose Zyprexa per psychiatry. Continue Cymbalta. Patient  will need outpatient psychiatric followup. Psychiatry following and appreciate input and recommendations.  #8 orthostasis Patient noted to be orthostatic yesterday. Patient blood pressure is borderline. May be secondary to recently started symbax. Will discontinue  Symbax. IV fluids. Repeat orthostasis in the morning. Monitor closely for volume overload.   #9 prophylaxis Lovenox for DVT prophylaxis.    Code Status: Full Family Communication: Updated patient no family at bedside. Disposition Plan: To skilled nursing facility when medically stable.   Consultants:  Neurology: Dr. Doy Mince 02/02/2014  Psychiatry: Dr. Louretta Shorten 02/06/2014  Procedures:  MRI head 02/02/2014  CT head 02/02/2014  Chest x-ray 02/02/2014  Renal ultrasound 02/02/2014  Antibiotics:  None  HPI/Subjective: No syncopal episodes overnight per nursing. Patient alert and following commands coherent. Patient complaining of some dizziness. Patient stated also been very sleepy this morning.  Objective: Filed Vitals:   02/08/14 1437  BP: 98/56  Pulse: 87  Temp: 98.3 F (36.8 C)  Resp: 18    Intake/Output Summary (Last 24 hours) at 02/08/14 1842 Last data filed at 02/08/14 1830  Gross per 24 hour  Intake 3607.5 ml  Output    650 ml  Net 2957.5 ml   Filed Weights   02/02/14 0510 02/03/14 0400 02/03/14 1127  Weight: 73.9 kg (162 lb 14.7 oz) 77.4 kg (170 lb 10.2 oz) 78.7 kg (173 lb 8 oz)    Exam:   General:  NAD  Cardiovascular: RRR  Respiratory: CTAB  Abdomen: Soft, nontender, mildly distended, positive bowel sounds.  Musculoskeletal: No clubbing cyanosis or edema. Status post left foot transmetatarsal amputation.  Data Reviewed: Basic Metabolic Panel:  Recent Labs Lab 02/02/14 0630 02/02/14 0818  02/04/14 0453 02/05/14 0426 02/06/14 0440 02/07/14 0459 02/08/14 0425 02/08/14 1507  NA 137 139  < > 136* 131* 131* 132* 136* 140  K  3.2* 3.3*  < > 4.0 4.4 4.2 5.0 4.5 4.4   CL 100 104  < > 107 101 101 102 106 111  CO2 22 23  < > 20 20 18* 21 21 20   GLUCOSE 376* 271*  < > 253* 539* 312* 353* 155* 91  BUN 23 24*  < > 22 19 20  25* 33* 34*  CREATININE 2.17* 2.21*  < > 1.95* 1.84* 1.73* 2.12* 2.63* 2.50*  CALCIUM 8.5 8.5  < > 7.1* 7.0* 7.1* 7.2* 7.4* 7.6*  MG  --  1.9  --  1.6 1.8  --   --   --   --   PHOS  --  2.1*  --  3.6  --   --   --   --   --   < > = values in this interval not displayed. Liver Function Tests:  Recent Labs Lab 02/02/14 0006  AST 27  ALT 25  ALKPHOS 87  BILITOT 0.2*  PROT 5.9*  ALBUMIN 1.6*   No results found for this basename: LIPASE, AMYLASE,  in the last 168 hours  Recent Labs Lab 02/02/14 0432  AMMONIA 39   CBC:  Recent Labs Lab 02/02/14 0006  02/03/14 0400 02/04/14 0453 02/05/14 0426 02/06/14 0440 02/08/14 0425  WBC 4.5  < > 4.8 3.4* 2.8* 3.5* 3.3*  NEUTROABS 3.3  --  2.4  --   --   --   --   HGB 10.4*  < > 9.5* 9.3* 9.8* 9.4* 8.5*  HCT 30.6*  < > 28.1* 27.4* 30.3* 28.4* 26.4*  MCV 82.7  < > 83.4 83.8 85.4 84.0 87.1  PLT 228  < > 248 221 208 191 167  < > = values in this interval not displayed. Cardiac Enzymes:  Recent Labs Lab 02/02/14 0006  TROPONINI <0.30   BNP (last 3 results)  Recent Labs  06/01/13 1619  PROBNP 1210.0*   CBG:  Recent Labs Lab 02/07/14 1610 02/07/14 2110 02/08/14 0714 02/08/14 1134 02/08/14 1700  GLUCAP 84 103* 150* 128* 122*    Recent Results (from the past 240 hour(s))  MRSA PCR SCREENING     Status: None   Collection Time    02/02/14  5:29 AM      Result Value Ref Range Status   MRSA by PCR NEGATIVE  NEGATIVE Final   Comment:            The GeneXpert MRSA Assay (FDA     approved for NASAL specimens     only), is one component of a     comprehensive MRSA colonization     surveillance program. It is not     intended to diagnose MRSA     infection nor to guide or     monitor treatment for     MRSA infections.     Performed at [facility] [facility]     Status: None   Collection Time    02/02/14 12:00 PM      Result Value Ref Range Status   Specimen Description URINE, RANDOM   Final   Special Requests NONE   Final   Culture  Setup Time     Final   Value: 02/02/2014 14:20     Performed at [facility]     Final   Value: NO GROWTH     Performed at [facility]   Culture     Final  Value: NO GROWTH     Performed at [facility]   Report Status 02/03/2014 FINAL   Final  MRSA PCR SCREENING     Status: None   Collection Time    02/06/14  3:04 AM      Result Value Ref Range Status   MRSA by PCR NEGATIVE  NEGATIVE Final   Comment:            The GeneXpert MRSA Assay (FDA     approved for NASAL specimens     only), is one component of a     comprehensive MRSA colonization     surveillance program. It is not     intended to diagnose MRSA     infection nor to guide or     monitor treatment for     MRSA infections.     Studies: No results found.  Scheduled Meds: . amLODipine  10 mg Oral Daily  . carvedilol  25 mg Oral BID WC  . [START ON 02/09/2014] DULoxetine  60 mg Oral Daily  . enoxaparin (LOVENOX) injection  40 mg Subcutaneous Q24H  . folic acid  0.5 mg Oral Daily  . insulin aspart  0-15 Units Subcutaneous TID WC  . insulin aspart  6 Units Subcutaneous TID WC  . insulin glargine  45 Units Subcutaneous q morning - 10a  . iron polysaccharides  150 mg Oral Daily  . levETIRAcetam  500 mg Oral BID  . OLANZapine zydis  5 mg Oral QHS  . pantoprazole  40 mg Oral Daily  . polyethylene glycol  17 g Oral BID  . pregabalin  75 mg Oral BID  . sodium bicarbonate  650 mg Oral BID  . sodium chloride  3 mL Intravenous Q12H   Continuous Infusions: . sodium chloride 1,000 mL (02/08/14 1727)    Principal Problem:   Seizures Active Problems:   Hypertensive emergency   Uncontrolled type 2 diabetes mellitus with hyperosmolar nonketotic hyperglycemia   Renal failure (ARF), acute on  chronic   DM (diabetes mellitus), type 2, uncontrolled   Hypertension, accelerated   Normocytic anemia   CKD (chronic kidney disease), stage III   Noncompliance   Bipolar disorder, unspecified   Schizoaffective disorder, unspecified condition    Time spent: [place] MD [facility] Pager [phone]. If 7PM-7AM, please contact night-coverage at [email or web address], password [facility] 02/08/2014, 6:42 PM  LOS: 7 days

## 2014-02-08 NOTE — Clinical Social Work Note (Signed)
SNF bed accepted at [facility] and we are awaiting medical clearance for d/c. MD advised of this today and we await kidneys to improve prior to transfer.  Eduard Clos, MSW, [place]

## 2014-02-08 NOTE — Consult Note (Signed)
[facility] Face-to-Face Psychiatry Consult   Reason for Consult:  Schizoaffective disorder Referring Physician:  Dr. [name] is an 48 y.o. female. Total Time spent with patient: 45 minutes  Assessment: AXIS I:  Schizoaffective Disorder and Substance Abuse AXIS II:  Deferred AXIS III:   Past Medical History  Diagnosis Date  . Neuropathy   . Glaucoma   . Bipolar 1 disorder   . CHF (congestive heart failure)   . Refusal of blood transfusions as patient is Jehovah's Witness   . Hypertension   . Shortness of breath   . Diabetes mellitus     INSULIN DEPENDENT  . TIA (transient ischemic attack)   . Neuromuscular disorder     DIABETIC NEUROPATHY  . Chronic kidney disease   . Hepatitis C virus   . Seizure     diagnosed 2014  . MRSA infection   . Bipolar disorder, unspecified 02/06/2014   AXIS IV:  other psychosocial or environmental problems, problems related to social environment and problems with primary support group AXIS V:  41-50 serious symptoms  Plan:  No evidence of imminent risk to self or others at present.   Patient does not meet criteria for psychiatric inpatient admission. Supportive therapy provided about ongoing stressors. Discussed crisis plan, support from social network, calling 911, coming to the Emergency Department, and calling Suicide Hotline. Discontinue symbyax 6/25 mg becauser of increased sedation and provide zyprexa zydis 5 mg Q6PM to avoid day time sedation and Continue Cymbalta 30 mg daily, which may be increased back to 60 mg a day tomorrow. Appreciate psychiatric consultation Please contact [phone] if needs further assistance  Subjective:   [name] is a 48 y.o. female patient admitted with bipolar and s/p seizure.  HPI: Patient is seen, chart reviewed and case discussed with the staff RN, paged Dr. Grandville Silos. Patient reported she has been suffering with auditory and visual hallucinations, she described her auditory hallucinations are  chaotic moistens bothering her, paranoid delusions, says her sister is watching her when she is watching TV and also reported people are talking about her, unable to sleep most of the nights and also reported unable to tolerate her new psychiatric medication Latuda secondary to swelling her face . Patient reportedly not able to tolerate other antipsychotic medication risperidone and Seroquel. Patient reportedly never taken medications Zyprexa Symbyax. Patient has been living with her daughter, daughters friend and her children. patient has history of seizures in the past and presented with report status post seizure episode. Patient also reported she has been taking Adderall for the last 14 years which was started [place], [place] and she was relocated to [place] in the month of April 2015. Patient denied suicidal or homicidal ideation, intentions or plans. Patient was seen [facility] mental health but does not like treatment is helping her and requesting to be referred to the different outpatient treatment center. Patient has a history of substance abuse.  Interval history: Patient has sleepy, tired and lonely. Case discussed with Dr. Grandville Silos stated that she has impaired renal function and orthostatic hypotension and needs appropriate medication treatment. Patient wishes to keep her zyprexa and agree to switch from Symbyax to zyprexa and cymbalta for better control of her sympotms. We may increase her cymbalta back to 60 mg Qam to control her mood and blood pressure. She has denied disturbance of appetite. Patient has been adjusting her medication changes and denied current paranoia and hallucinations. Patient has no extrapyramidal symptoms. Patient denied suicidal  or homicidal ideations, intentions or plans. She has no reported seizure episode since admission.  Medical history: [name] is a 48 y.o. female with a history of Seizures, IDDM, HTN, CHF, and Bipolar Disorder who was brought to the ED  after her daughter witnessed her having a seizure. She arrived to the ED and was post -Ictal with Blood pressures measuring 220/120. She was given Labetalol and had transient improvement but her blood pressure rebounded back into the 588'F systolic. She was then placed on a Cardene drip for blood pressure control. Her lab studies revealed a glucose levelof 669 and she was placed on the glucose stabilizer. She was referred for medical admission. Her son is at the bedside and he is assisting with the medical history.   HPI Elements:   Location:  Psychosis. Quality:  Poor. Severity:  Moderate. Timing:  Status post seizure.  Past Psychiatric History: Past Medical History  Diagnosis Date  . Neuropathy   . Glaucoma   . Bipolar 1 disorder   . CHF (congestive heart failure)   . Refusal of blood transfusions as patient is Jehovah's Witness   . Hypertension   . Shortness of breath   . Diabetes mellitus     INSULIN DEPENDENT  . TIA (transient ischemic attack)   . Neuromuscular disorder     DIABETIC NEUROPATHY  . Chronic kidney disease   . Hepatitis C virus   . Seizure     diagnosed 2014  . MRSA infection   . Bipolar disorder, unspecified 02/06/2014    reports that she has been smoking Cigarettes.  She has a 7.5 pack-year smoking history. She has never used smokeless tobacco. She reports that she drinks alcohol. She reports that she does not use illicit drugs. Family History  Problem Relation Age of Onset  . Hypertension Mother   . Hypertension Father      Living Arrangements: Children   Abuse/Neglect [facility]) Physical Abuse: Denies Verbal Abuse: Denies Sexual Abuse: Denies Allergies:   Allergies  Allergen Reactions  . Gabapentin Itching and Swelling  . Saphris [Asenapine] Swelling    Swelling of the face  . Tramadol     Causes legs to swell  . Penicillins Hives    ACT Assessment Complete:  No  Objective: Blood pressure 113/64, pulse 89, temperature 98.3 F (36.8 C),  temperature source Oral, resp. rate 16, height 5' 6" (1.676 m), weight 78.7 kg (173 lb 8 oz), SpO2 96.00%.Body mass index is 28.02 kg/(m^2). Results for orders placed during the hospital encounter of 02/01/14 (from the past 72 hour(s))  GLUCOSE, CAPILLARY     Status: Abnormal   Collection Time    02/05/14 11:45 AM      Result Value Ref Range   Glucose-Capillary 226 (*) 70 - 99 mg/dL   Comment 1 Documented in Chart     Comment 2 Notify RN    GLUCOSE, CAPILLARY     Status: Abnormal   Collection Time    02/05/14  4:59 PM      Result Value Ref Range   Glucose-Capillary 161 (*) 70 - 99 mg/dL  GLUCOSE, CAPILLARY     Status: Abnormal   Collection Time    02/05/14 10:22 PM      Result Value Ref Range   Glucose-Capillary 140 (*) 70 - 99 mg/dL  MRSA PCR SCREENING     Status: None   Collection Time    02/06/14  3:04 AM      Result Value Ref Range  MRSA by PCR NEGATIVE  NEGATIVE   Comment:            The GeneXpert MRSA Assay (FDA     approved for NASAL specimens     only), is one component of a     comprehensive MRSA colonization     surveillance program. It is not     intended to diagnose MRSA     infection nor to guide or     monitor treatment for     MRSA infections.  BASIC METABOLIC PANEL     Status: Abnormal   Collection Time    02/06/14  4:40 AM      Result Value Ref Range   Sodium 131 (*) 137 - 147 mEq/L   Potassium 4.2  3.7 - 5.3 mEq/L   Chloride 101  96 - 112 mEq/L   CO2 18 (*) 19 - 32 mEq/L   Glucose, Bld 312 (*) 70 - 99 mg/dL   BUN 20  6 - 23 mg/dL   Creatinine, Ser 1.73 (*) 0.50 - 1.10 mg/dL   Calcium 7.1 (*) 8.4 - 10.5 mg/dL   GFR calc non Af Amer 34 (*) >90 mL/min   GFR calc Af Amer 39 (*) >90 mL/min   Comment: (NOTE)     The eGFR has been calculated using the CKD EPI equation.     This calculation has not been validated in all clinical situations.     eGFR's persistently <90 mL/min signify possible Chronic Kidney     Disease.   Anion gap 12  5 - 15  CBC      Status: Abnormal   Collection Time    02/06/14  4:40 AM      Result Value Ref Range   WBC 3.5 (*) 4.0 - 10.5 K/uL   RBC 3.38 (*) 3.87 - 5.11 MIL/uL   Hemoglobin 9.4 (*) 12.0 - 15.0 g/dL   HCT 28.4 (*) 36.0 - 46.0 %   MCV 84.0  78.0 - 100.0 fL   MCH 27.8  26.0 - 34.0 pg   MCHC 33.1  30.0 - 36.0 g/dL   RDW 14.7  11.5 - 15.5 %   Platelets 191  150 - 400 K/uL  GLUCOSE, CAPILLARY     Status: Abnormal   Collection Time    02/06/14  7:40 AM      Result Value Ref Range   Glucose-Capillary 384 (*) 70 - 99 mg/dL   Comment 1 Documented in Chart     Comment 2 Notify RN    GLUCOSE, CAPILLARY     Status: Abnormal   Collection Time    02/06/14 11:28 AM      Result Value Ref Range   Glucose-Capillary 334 (*) 70 - 99 mg/dL  GLUCOSE, CAPILLARY     Status: Abnormal   Collection Time    02/06/14  5:08 PM      Result Value Ref Range   Glucose-Capillary 205 (*) 70 - 99 mg/dL  GLUCOSE, CAPILLARY     Status: Abnormal   Collection Time    02/06/14  9:16 PM      Result Value Ref Range   Glucose-Capillary 159 (*) 70 - 99 mg/dL   Comment 1 Notify RN    BASIC METABOLIC PANEL     Status: Abnormal   Collection Time    02/07/14  4:59 AM      Result Value Ref Range   Sodium 132 (*) 137 - 147 mEq/L   Potassium  5.0  3.7 - 5.3 mEq/L   Chloride 102  96 - 112 mEq/L   CO2 21  19 - 32 mEq/L   Glucose, Bld 353 (*) 70 - 99 mg/dL   BUN 25 (*) 6 - 23 mg/dL   Creatinine, Ser 2.12 (*) 0.50 - 1.10 mg/dL   Calcium 7.2 (*) 8.4 - 10.5 mg/dL   GFR calc non Af Amer 27 (*) >90 mL/min   GFR calc Af Amer 31 (*) >90 mL/min   Comment: (NOTE)     The eGFR has been calculated using the CKD EPI equation.     This calculation has not been validated in all clinical situations.     eGFR's persistently <90 mL/min signify possible Chronic Kidney     Disease.   Anion gap 9  5 - 15  GLUCOSE, CAPILLARY     Status: Abnormal   Collection Time    02/07/14  7:34 AM      Result Value Ref Range   Glucose-Capillary 344 (*) 70 - 99  mg/dL   Comment 1 Documented in Chart     Comment 2 Notify RN    GLUCOSE, CAPILLARY     Status: Abnormal   Collection Time    02/07/14 11:41 AM      Result Value Ref Range   Glucose-Capillary 196 (*) 70 - 99 mg/dL  GLUCOSE, CAPILLARY     Status: None   Collection Time    02/07/14  4:10 PM      Result Value Ref Range   Glucose-Capillary 84  70 - 99 mg/dL  GLUCOSE, CAPILLARY     Status: Abnormal   Collection Time    02/07/14  9:10 PM      Result Value Ref Range   Glucose-Capillary 103 (*) 70 - 99 mg/dL   Comment 1 Notify RN     Comment 2 Documented in Chart    BASIC METABOLIC PANEL     Status: Abnormal   Collection Time    02/08/14  4:25 AM      Result Value Ref Range   Sodium 136 (*) 137 - 147 mEq/L   Potassium 4.5  3.7 - 5.3 mEq/L   Chloride 106  96 - 112 mEq/L   CO2 21  19 - 32 mEq/L   Glucose, Bld 155 (*) 70 - 99 mg/dL   BUN 33 (*) 6 - 23 mg/dL   Creatinine, Ser 2.63 (*) 0.50 - 1.10 mg/dL   Calcium 7.4 (*) 8.4 - 10.5 mg/dL   GFR calc non Af Amer 21 (*) >90 mL/min   GFR calc Af Amer 24 (*) >90 mL/min   Comment: (NOTE)     The eGFR has been calculated using the CKD EPI equation.     This calculation has not been validated in all clinical situations.     eGFR's persistently <90 mL/min signify possible Chronic Kidney     Disease.   Anion gap 9  5 - 15  CBC     Status: Abnormal   Collection Time    02/08/14  4:25 AM      Result Value Ref Range   WBC 3.3 (*) 4.0 - 10.5 K/uL   RBC 3.03 (*) 3.87 - 5.11 MIL/uL   Hemoglobin 8.5 (*) 12.0 - 15.0 g/dL   HCT 26.4 (*) 36.0 - 46.0 %   MCV 87.1  78.0 - 100.0 fL   MCH 28.1  26.0 - 34.0 pg   MCHC 32.2  30.0 - 36.0  g/dL   RDW 15.4  11.5 - 15.5 %   Platelets 167  150 - 400 K/uL  GLUCOSE, CAPILLARY     Status: Abnormal   Collection Time    02/08/14  7:14 AM      Result Value Ref Range   Glucose-Capillary 150 (*) 70 - 99 mg/dL   Comment 1 Documented in Chart     Comment 2 Notify RN     Labs are reviewed and are pertinent  for.  Current Facility-Administered Medications  Medication Dose Route Frequency Provider Last Rate Last Dose  . 0.9 %  sodium chloride infusion  250 mL Intravenous Continuous Eugenie Filler, MD 75 mL/hr at 02/07/14 2113 250 mL at 02/07/14 2113  . acetaminophen (TYLENOL) tablet 650 mg  650 mg Oral Q6H PRN Theressa Millard, MD   650 mg at 02/05/14 2232   Or  . acetaminophen (TYLENOL) suppository 650 mg  650 mg Rectal Q6H PRN Theressa Millard, MD      . amLODipine (NORVASC) tablet 10 mg  10 mg Oral Daily Eugenie Filler, MD   10 mg at 02/06/14 1110  . carvedilol (COREG) tablet 25 mg  25 mg Oral BID WC Eugenie Filler, MD   25 mg at 02/08/14 0736  . DULoxetine (CYMBALTA) DR capsule 30 mg  30 mg Oral Daily Durward Parcel, MD   30 mg at 02/08/14 0900  . enoxaparin (LOVENOX) injection 40 mg  40 mg Subcutaneous Q24H Randall K Absher, RPH   40 mg at 02/08/14 0900  . folic acid (FOLVITE) tablet 0.5 mg  0.5 mg Oral Daily Irine Seal V, MD   0.5 mg at 02/08/14 0900  . gi cocktail (Maalox,Lidocaine,Donnatal)  30 mL Oral TID PRN Eugenie Filler, MD   30 mL at 02/05/14 0802  . hydrALAZINE (APRESOLINE) injection 10 mg  10 mg Intravenous Q6H PRN Eugenie Filler, MD   10 mg at 02/05/14 0446  . HYDROmorphone (DILAUDID) injection 0.5-1 mg  0.5-1 mg Intravenous Q3H PRN Theressa Millard, MD   1 mg at 02/05/14 0540  . insulin aspart (novoLOG) injection 0-15 Units  0-15 Units Subcutaneous TID WC Eugenie Filler, MD   2 Units at 02/08/14 [phone]  . insulin aspart (novoLOG) injection 6 Units  6 Units Subcutaneous TID WC Eugenie Filler, MD   6 Units at 02/08/14 [phone]  . insulin glargine (LANTUS) injection 45 Units  45 Units Subcutaneous q morning - 10a Eugenie Filler, MD   45 Units at 02/08/14 0901  . iron polysaccharides (NIFEREX) capsule 150 mg  150 mg Oral Daily Eugenie Filler, MD   150 mg at 02/08/14 0900  . levETIRAcetam (KEPPRA) tablet 500 mg  500 mg Oral BID Eugenie Filler,  MD   500 mg at 02/08/14 0900  . LORazepam (ATIVAN) injection 1 mg  1 mg Intravenous Q4H PRN Theressa Millard, MD   1 mg at 02/03/14 2239  . ondansetron (ZOFRAN) tablet 4 mg  4 mg Oral Q6H PRN Theressa Millard, MD       Or  . ondansetron (ZOFRAN) injection 4 mg  4 mg Intravenous Q6H PRN Theressa Millard, MD      . pantoprazole (PROTONIX) EC tablet 40 mg  40 mg Oral Daily Eugenie Filler, MD   40 mg at 02/08/14 0900  . polyethylene glycol (MIRALAX / GLYCOLAX) packet 17 g  17 g Oral BID Eugenie Filler, MD  17 g at 02/08/14 0856  . pregabalin (LYRICA) capsule 75 mg  75 mg Oral BID Eugenie Filler, MD   75 mg at 02/08/14 0859  . sodium bicarbonate tablet 650 mg  650 mg Oral BID Eugenie Filler, MD   650 mg at 02/08/14 0859  . sodium chloride 0.9 % injection 3 mL  3 mL Intravenous Q12H Theressa Millard, MD   3 mL at 02/04/14 1055  . sodium chloride 0.9 % injection 3 mL  3 mL Intravenous PRN Theressa Millard, MD        Psychiatric Specialty Exam: Physical Exam  ROS  Blood pressure 113/64, pulse 89, temperature 98.3 F (36.8 C), temperature source Oral, resp. rate 16, height 5' 6" (1.676 m), weight 78.7 kg (173 lb 8 oz), SpO2 96.00%.Body mass index is 28.02 kg/(m^2).  General Appearance: Guarded  Eye Contact::  Good  Speech:  Clear and Coherent  Volume:  Decreased  Mood:  Depressed  Affect:  Appropriate and Congruent  Thought Process:  Coherent and Goal Directed  Orientation:  Full (Time, Place, and Person)  Thought Content:  WDL  Suicidal Thoughts:  No  Homicidal Thoughts:  No  Memory:  Immediate;   Fair  Judgement:  Intact  Insight:  Fair  Psychomotor Activity:  Psychomotor Retardation  Concentration:  Fair  Recall:  Smiley Houseman of Knowledge:Fair  Language: Good  Akathisia:  NA  Handed:  Right  AIMS (if indicated):     Assets:  Communication Skills Desire for Improvement Housing Intimacy Leisure Time Physical Health Resilience Social Support  Sleep:       Musculoskeletal: Strength & Muscle Tone: within normal limits Gait & Station: normal Patient leans: N/A  Treatment Plan Summary: Daily contact with patient to assess and evaluate symptoms and progress in treatment Medication management  Vere Diantonio,JANARDHAHA R. 02/08/2014 10:04 AM

## 2014-02-08 NOTE — Progress Notes (Signed)
Occupational Therapy Treatment Patient Details Name: [name] MRN: [id number] DOB: 11/29/1965 Today's Date: 02/08/2014    History of present illness 48 yo female admitted with seizure, HTN, hyperglycemia. hx of L midfoot amputation 2014, seizure, neuropathy, bipolar d/o, glaucoma, DM, HTN, Hep C   OT comments  Pt making progress:  Overall min guard for adls and cues for safety are needed  Follow Up Recommendations  Home health OT;Supervision/Assistance - 24 hour    Equipment Recommendations  None recommended by OT    Recommendations for Other Services      Precautions / Restrictions Precautions Precautions: Fall Precaution Comments: Seziure       Mobility Bed Mobility                  Transfers     Transfers: Sit to/from Stand Sit to Stand: Min guard         General transfer comment: for safety; pt with orthostatic hypotension    Balance                                   ADL               Lower Body Bathing: Min guard;Sit to/from stand       Lower Body Dressing: Min guard;Sit to/from stand   Toilet Transfer: Min guard;Ambulation;BSC   Toileting- Clothing Manipulation and Hygiene: Min guard;Sit to/from stand         General ADL Comments: ambulated to bathroom and completed adl, including donning new PJ set.  Pt needed safety cues as she moves quickly, reached for items that fell to floor and attempted to don underwear from standing position      Vision                     Perception     Praxis      Cognition   Behavior During Therapy: WFL for tasks assessed/performed Overall Cognitive Status: No family/caregiver present to determine baseline cognitive functioning            Safety/Judgement: Decreased awareness of safety;Decreased awareness of deficits          Extremity/Trunk Assessment               Exercises     Shoulder Instructions       General Comments      Pertinent  Vitals/ Pain       No c/o pain.  See doc flowsheets for orthostatics:  Pt with orthostatic hypotension  Home Living                                          Prior Functioning/Environment              Frequency Min 2X/week     Progress Toward Goals  OT Goals(current goals can now be found in the care plan section)  Progress towards OT goals: Goals met and updated - see care plan  ADL Goals Pt Will Perform Lower Body Bathing: with supervision;sit to/from stand Pt Will Perform Lower Body Dressing: with supervision;sit to/from stand Pt Will Transfer to Toilet: with supervision;ambulating;bedside commode Additional ADL Goal #1: pt will not need any safety cues during session  Plan      Co-evaluation                   End of Session     Activity Tolerance Patient tolerated treatment well   Patient Left in bed;with call [facility]/phone within reach   Nurse Communication  (assist with IV to don PJ top)        Time: 1025-8527 OT Time Calculation (min): 32 min  Charges: OT General Charges $OT Visit: 1 Procedure OT Treatments $Self Care/Home Management : 23-37 mins  Briseida Gittings 02/08/2014, 4:18 PM   Lesle Chris, OTR/L [phone] 02/08/2014

## 2014-02-09 DIAGNOSIS — L02619 Cutaneous abscess of unspecified foot: Secondary | ICD-10-CM

## 2014-02-09 DIAGNOSIS — F319 Bipolar disorder, unspecified: Secondary | ICD-10-CM

## 2014-02-09 DIAGNOSIS — E1165 Type 2 diabetes mellitus with hyperglycemia: Secondary | ICD-10-CM

## 2014-02-09 DIAGNOSIS — IMO0001 Reserved for inherently not codable concepts without codable children: Secondary | ICD-10-CM

## 2014-02-09 DIAGNOSIS — L03119 Cellulitis of unspecified part of limb: Secondary | ICD-10-CM

## 2014-02-09 LAB — CBC
HCT: 27 % — ABNORMAL LOW (ref 36.0–46.0)
HEMOGLOBIN: 8.7 g/dL — AB (ref 12.0–15.0)
MCH: 28.2 pg (ref 26.0–34.0)
MCHC: 32.2 g/dL (ref 30.0–36.0)
MCV: 87.7 fL (ref 78.0–100.0)
Platelets: 189 10*3/uL (ref 150–400)
RBC: 3.08 MIL/uL — AB (ref 3.87–5.11)
RDW: 15.5 % (ref 11.5–15.5)
WBC: 3.2 10*3/uL — AB (ref 4.0–10.5)

## 2014-02-09 LAB — BASIC METABOLIC PANEL
ANION GAP: 9 (ref 5–15)
BUN: 35 mg/dL — ABNORMAL HIGH (ref 6–23)
CO2: 20 meq/L (ref 19–32)
Calcium: 7.7 mg/dL — ABNORMAL LOW (ref 8.4–10.5)
Chloride: 104 mEq/L (ref 96–112)
Creatinine, Ser: 2.38 mg/dL — ABNORMAL HIGH (ref 0.50–1.10)
GFR calc Af Amer: 27 mL/min — ABNORMAL LOW (ref >90)
GFR calc non Af Amer: 23 mL/min — ABNORMAL LOW (ref >90)
GLUCOSE: 294 mg/dL — AB (ref 70–99)
POTASSIUM: 4.8 meq/L (ref 3.7–5.3)
SODIUM: 133 meq/L — AB (ref 137–147)

## 2014-02-09 LAB — URINE CULTURE: Colony Count: 80000

## 2014-02-09 LAB — GLUCOSE, CAPILLARY
GLUCOSE-CAPILLARY: 175 mg/dL — AB (ref 70–99)
GLUCOSE-CAPILLARY: 307 mg/dL — AB (ref 70–99)
Glucose-Capillary: 184 mg/dL — ABNORMAL HIGH (ref 70–99)
Glucose-Capillary: 204 mg/dL — ABNORMAL HIGH (ref 70–99)
Glucose-Capillary: 240 mg/dL — ABNORMAL HIGH (ref 70–99)

## 2014-02-09 MED ORDER — FUROSEMIDE 10 MG/ML IJ SOLN
40.0000 mg | Freq: Two times a day (BID) | INTRAMUSCULAR | Status: DC
  Administered 2014-02-09 – 2014-02-10 (×3): 40 mg via INTRAVENOUS
  Filled 2014-02-09 (×6): qty 4

## 2014-02-09 MED ORDER — INSULIN GLARGINE 100 UNIT/ML ~~LOC~~ SOLN
55.0000 [IU] | Freq: Every morning | SUBCUTANEOUS | Status: DC
  Administered 2014-02-10: 55 [IU] via SUBCUTANEOUS
  Filled 2014-02-09: qty 0.55

## 2014-02-09 MED ORDER — INSULIN GLARGINE 100 UNIT/ML ~~LOC~~ SOLN
10.0000 [IU] | Freq: Once | SUBCUTANEOUS | Status: AC
  Administered 2014-02-09: 10 [IU] via SUBCUTANEOUS
  Filled 2014-02-09: qty 0.1

## 2014-02-09 MED ORDER — SODIUM CHLORIDE 0.9 % IV SOLN: 250.0000 mL | INTRAVENOUS | Status: DC

## 2014-02-09 NOTE — Consult Note (Signed)
[facility] Face-to-Face Psychiatry Consult   Reason for Consult:  Schizoaffective disorder Referring Physician:  Dr. [name] is an 48 y.o. female. Total Time spent with patient: 45 minutes  Assessment: AXIS I:  Schizoaffective Disorder and Substance Abuse AXIS II:  Deferred AXIS III:   Past Medical History  Diagnosis Date  . Neuropathy   . Glaucoma   . Bipolar 1 disorder   . CHF (congestive heart failure)   . Refusal of blood transfusions as patient is Jehovah's Witness   . Hypertension   . Shortness of breath   . Diabetes mellitus     INSULIN DEPENDENT  . TIA (transient ischemic attack)   . Neuromuscular disorder     DIABETIC NEUROPATHY  . Chronic kidney disease   . Hepatitis C virus   . Seizure     diagnosed 2014  . MRSA infection   . Bipolar disorder, unspecified 02/06/2014  . Schizoaffective disorder, unspecified condition 02/08/2014   AXIS IV:  other psychosocial or environmental problems, problems related to social environment and problems with primary support group AXIS V:  41-50 serious symptoms  Plan:  No evidence of imminent risk to self or others at present.   Patient does not meet criteria for psychiatric inpatient admission. Supportive therapy provided about ongoing stressors. Discussed crisis plan, support from social network, calling 911, coming to the Emergency Department, and calling Suicide Hotline. Continue Zyprexa zydis 5 mg Q6PM to avoid day time sedation and Continue Cymbalta 30 mg daily, which may be increased back to 60 mg a day if clinically required. Appreciate psychiatric consultation and follow up as clinically required Please contact [phone] if needs further assistance  Subjective:   [name] is a 48 y.o. female patient admitted with bipolar and s/p seizure.  HPI: Patient is seen, chart reviewed and case discussed with the staff RN, paged Dr. Grandville Silos. Patient reported she has been suffering with auditory and visual hallucinations,  she described her auditory hallucinations are chaotic moistens bothering her, paranoid delusions, says her sister is watching her when she is watching TV and also reported people are talking about her, unable to sleep most of the nights and also reported unable to tolerate her new psychiatric medication Latuda secondary to swelling her face . Patient reportedly not able to tolerate other antipsychotic medication risperidone and Seroquel. Patient reportedly never taken medications Zyprexa Symbyax. Patient has been living with her daughter, daughters friend and her children. patient has history of seizures in the past and presented with report status post seizure episode. Patient also reported she has been taking Adderall for the last 14 years which was started [place], [place] and she was relocated to [place] in the month of April 2015. Patient denied suicidal or homicidal ideation, intentions or plans. Patient was seen [facility] mental health but does not like treatment is helping her and requesting to be referred to the different outpatient treatment center. Patient has a history of substance abuse.  Interval history: Patient appeared in her room, awake, alert and oriented well. She has been up from her bed and walking around in her room. She has leg swelling and has been taking appropriate care. Patient has denied dizziness, drowsiness and sedation. She has fairly good sleep and appetite. She has denied paranoia, auditory and visual hallucinations. Patient has been adjusting her medication changes. Patient has no extrapyramidal symptoms. Patient denied suicidal or homicidal ideations, intentions or plans. She has no reported seizure episode since admission. Patient is  satisfied with he psych medication and hoping to be released to out patient care soon.   Medical history: [name] is a 48 y.o. female with a history of Seizures, IDDM, HTN, CHF, and Bipolar Disorder who was brought to the ED after  her daughter witnessed her having a seizure. She arrived to the ED and was post -Ictal with Blood pressures measuring 220/120. She was given Labetalol and had transient improvement but her blood pressure rebounded back into the 034'V systolic. She was then placed on a Cardene drip for blood pressure control. Her lab studies revealed a glucose levelof 669 and she was placed on the glucose stabilizer. She was referred for medical admission. Her son is at the bedside and he is assisting with the medical history.   HPI Elements:   Location:  Psychosis. Quality:  Poor. Severity:  Moderate. Timing:  Status post seizure.  Past Psychiatric History: Past Medical History  Diagnosis Date  . Neuropathy   . Glaucoma   . Bipolar 1 disorder   . CHF (congestive heart failure)   . Refusal of blood transfusions as patient is Jehovah's Witness   . Hypertension   . Shortness of breath   . Diabetes mellitus     INSULIN DEPENDENT  . TIA (transient ischemic attack)   . Neuromuscular disorder     DIABETIC NEUROPATHY  . Chronic kidney disease   . Hepatitis C virus   . Seizure     diagnosed 2014  . MRSA infection   . Bipolar disorder, unspecified 02/06/2014  . Schizoaffective disorder, unspecified condition 02/08/2014    reports that she has been smoking Cigarettes.  She has a 7.5 pack-year smoking history. She has never used smokeless tobacco. She reports that she drinks alcohol. She reports that she does not use illicit drugs. Family History  Problem Relation Age of Onset  . Hypertension Mother   . Hypertension Father      Living Arrangements: Children   Abuse/Neglect [facility]) Physical Abuse: Denies Verbal Abuse: Denies Sexual Abuse: Denies Allergies:   Allergies  Allergen Reactions  . Gabapentin Itching and Swelling  . Saphris [Asenapine] Swelling    Swelling of the face  . Tramadol     Causes legs to swell  . Penicillins Hives    ACT Assessment Complete:  No  Objective: Blood pressure  131/77, pulse 102, temperature 98.6 F (37 C), temperature source Oral, resp. rate 18, height $RemoveBe'5\' 6"'iKfjHkBDA$  (1.676 m), weight 78.7 kg (173 lb 8 oz), SpO2 100.00%.Body mass index is 28.02 kg/(m^2). Results for orders placed during the hospital encounter of 02/01/14 (from the past 72 hour(s))  GLUCOSE, CAPILLARY     Status: Abnormal   Collection Time    02/06/14  5:08 PM      Result Value Ref Range   Glucose-Capillary 205 (*) 70 - 99 mg/dL  GLUCOSE, CAPILLARY     Status: Abnormal   Collection Time    02/06/14  9:16 PM      Result Value Ref Range   Glucose-Capillary 159 (*) 70 - 99 mg/dL   Comment 1 Notify RN    BASIC METABOLIC PANEL     Status: Abnormal   Collection Time    02/07/14  4:59 AM      Result Value Ref Range   Sodium 132 (*) 137 - 147 mEq/L   Potassium 5.0  3.7 - 5.3 mEq/L   Chloride 102  96 - 112 mEq/L   CO2 21  19 - 32 mEq/L  Glucose, Bld 353 (*) 70 - 99 mg/dL   BUN 25 (*) 6 - 23 mg/dL   Creatinine, Ser 2.12 (*) 0.50 - 1.10 mg/dL   Calcium 7.2 (*) 8.4 - 10.5 mg/dL   GFR calc non Af Amer 27 (*) >90 mL/min   GFR calc Af Amer 31 (*) >90 mL/min   Comment: (NOTE)     The eGFR has been calculated using the CKD EPI equation.     This calculation has not been validated in all clinical situations.     eGFR's persistently <90 mL/min signify possible Chronic Kidney     Disease.   Anion gap 9  5 - 15  GLUCOSE, CAPILLARY     Status: Abnormal   Collection Time    02/07/14  7:34 AM      Result Value Ref Range   Glucose-Capillary 344 (*) 70 - 99 mg/dL   Comment 1 Documented in Chart     Comment 2 Notify RN    GLUCOSE, CAPILLARY     Status: Abnormal   Collection Time    02/07/14 11:41 AM      Result Value Ref Range   Glucose-Capillary 196 (*) 70 - 99 mg/dL  GLUCOSE, CAPILLARY     Status: None   Collection Time    02/07/14  4:10 PM      Result Value Ref Range   Glucose-Capillary 84  70 - 99 mg/dL  GLUCOSE, CAPILLARY     Status: Abnormal   Collection Time    02/07/14  9:10 PM       Result Value Ref Range   Glucose-Capillary 103 (*) 70 - 99 mg/dL   Comment 1 Notify RN     Comment 2 Documented in Chart    BASIC METABOLIC PANEL     Status: Abnormal   Collection Time    02/08/14  4:25 AM      Result Value Ref Range   Sodium 136 (*) 137 - 147 mEq/L   Potassium 4.5  3.7 - 5.3 mEq/L   Chloride 106  96 - 112 mEq/L   CO2 21  19 - 32 mEq/L   Glucose, Bld 155 (*) 70 - 99 mg/dL   BUN 33 (*) 6 - 23 mg/dL   Creatinine, Ser 2.63 (*) 0.50 - 1.10 mg/dL   Calcium 7.4 (*) 8.4 - 10.5 mg/dL   GFR calc non Af Amer 21 (*) >90 mL/min   GFR calc Af Amer 24 (*) >90 mL/min   Comment: (NOTE)     The eGFR has been calculated using the CKD EPI equation.     This calculation has not been validated in all clinical situations.     eGFR's persistently <90 mL/min signify possible Chronic Kidney     Disease.   Anion gap 9  5 - 15  CBC     Status: Abnormal   Collection Time    02/08/14  4:25 AM      Result Value Ref Range   WBC 3.3 (*) 4.0 - 10.5 K/uL   RBC 3.03 (*) 3.87 - 5.11 MIL/uL   Hemoglobin 8.5 (*) 12.0 - 15.0 g/dL   HCT 26.4 (*) 36.0 - 46.0 %   MCV 87.1  78.0 - 100.0 fL   MCH 28.1  26.0 - 34.0 pg   MCHC 32.2  30.0 - 36.0 g/dL   RDW 15.4  11.5 - 15.5 %   Platelets 167  150 - 400 K/uL  GLUCOSE, CAPILLARY  Status: Abnormal   Collection Time    02/08/14  7:14 AM      Result Value Ref Range   Glucose-Capillary 150 (*) 70 - 99 mg/dL   Comment 1 Documented in Chart     Comment 2 Notify RN    SODIUM, URINE, RANDOM     Status: None   Collection Time    02/08/14 10:02 AM      Result Value Ref Range   Sodium, Ur 28     Comment: Performed at [place], URINE, RANDOM     Status: None   Collection Time    02/08/14 10:02 AM      Result Value Ref Range   Creatinine, Urine 104.2     Comment: Performed at [place], ROUTINE W REFLEX MICROSCOPIC     Status: Abnormal   Collection Time    02/08/14 10:02 AM      Result Value Ref Range    Color, Urine YELLOW  YELLOW   APPearance CLOUDY (*) CLEAR   Specific Gravity, Urine 1.014  1.005 - 1.030   pH 5.5  5.0 - 8.0   Glucose, UA 100 (*) NEGATIVE mg/dL   Hgb urine dipstick TRACE (*) NEGATIVE   Bilirubin Urine NEGATIVE  NEGATIVE   Ketones, ur NEGATIVE  NEGATIVE mg/dL   Protein, ur >300 (*) NEGATIVE mg/dL   Urobilinogen, UA 0.2  0.0 - 1.0 mg/dL   Nitrite NEGATIVE  NEGATIVE   Leukocytes, UA TRACE (*) NEGATIVE  URINE MICROSCOPIC-ADD ON     Status: Abnormal   Collection Time    02/08/14 10:02 AM      Result Value Ref Range   Squamous Epithelial / LPF MANY (*) RARE   WBC, UA 3-6  <3 WBC/hpf   RBC / HPF 3-6  <3 RBC/hpf   Bacteria, UA MANY (*) RARE   Urine-Other MUCOUS PRESENT    GLUCOSE, CAPILLARY     Status: Abnormal   Collection Time    02/08/14 11:34 AM      Result Value Ref Range   Glucose-Capillary 128 (*) 70 - 99 mg/dL   Comment 1 Documented in Chart     Comment 2 Notify RN    BASIC METABOLIC PANEL     Status: Abnormal   Collection Time    02/08/14  3:07 PM      Result Value Ref Range   Sodium 140  137 - 147 mEq/L   Potassium 4.4  3.7 - 5.3 mEq/L   Chloride 111  96 - 112 mEq/L   CO2 20  19 - 32 mEq/L   Glucose, Bld 91  70 - 99 mg/dL   BUN 34 (*) 6 - 23 mg/dL   Creatinine, Ser 2.50 (*) 0.50 - 1.10 mg/dL   Calcium 7.6 (*) 8.4 - 10.5 mg/dL   GFR calc non Af Amer 22 (*) >90 mL/min   GFR calc Af Amer 25 (*) >90 mL/min   Comment: (NOTE)     The eGFR has been calculated using the CKD EPI equation.     This calculation has not been validated in all clinical situations.     eGFR's persistently <90 mL/min signify possible Chronic Kidney     Disease.   Anion gap 9  5 - 15  GLUCOSE, CAPILLARY     Status: Abnormal   Collection Time    02/08/14  5:00 PM      Result Value Ref Range   Glucose-Capillary 122 (*)  70 - 99 mg/dL  GLUCOSE, CAPILLARY     Status: Abnormal   Collection Time    02/08/14  9:55 PM      Result Value Ref Range   Glucose-Capillary 175 (*) 70 -  99 mg/dL   Comment 1 Documented in Chart     Comment 2 Notify RN    CBC     Status: Abnormal   Collection Time    02/09/14  4:15 AM      Result Value Ref Range   WBC 3.2 (*) 4.0 - 10.5 K/uL   RBC 3.08 (*) 3.87 - 5.11 MIL/uL   Hemoglobin 8.7 (*) 12.0 - 15.0 g/dL   HCT 27.0 (*) 36.0 - 46.0 %   MCV 87.7  78.0 - 100.0 fL   MCH 28.2  26.0 - 34.0 pg   MCHC 32.2  30.0 - 36.0 g/dL   RDW 15.5  11.5 - 15.5 %   Platelets 189  150 - 400 K/uL  BASIC METABOLIC PANEL     Status: Abnormal   Collection Time    02/09/14  4:15 AM      Result Value Ref Range   Sodium 133 (*) 137 - 147 mEq/L   Comment: DELTA CHECK NOTED     REPEATED TO VERIFY   Potassium 4.8  3.7 - 5.3 mEq/L   Chloride 104  96 - 112 mEq/L   CO2 20  19 - 32 mEq/L   Glucose, Bld 294 (*) 70 - 99 mg/dL   BUN 35 (*) 6 - 23 mg/dL   Creatinine, Ser 2.38 (*) 0.50 - 1.10 mg/dL   Calcium 7.7 (*) 8.4 - 10.5 mg/dL   GFR calc non Af Amer 23 (*) >90 mL/min   GFR calc Af Amer 27 (*) >90 mL/min   Comment: (NOTE)     The eGFR has been calculated using the CKD EPI equation.     This calculation has not been validated in all clinical situations.     eGFR's persistently <90 mL/min signify possible Chronic Kidney     Disease.   Anion gap 9  5 - 15  GLUCOSE, CAPILLARY     Status: Abnormal   Collection Time    02/09/14  7:22 AM      Result Value Ref Range   Glucose-Capillary 307 (*) 70 - 99 mg/dL  GLUCOSE, CAPILLARY     Status: Abnormal   Collection Time    02/09/14 11:44 AM      Result Value Ref Range   Glucose-Capillary 184 (*) 70 - 99 mg/dL   Labs are reviewed and are pertinent for.  Current Facility-Administered Medications  Medication Dose Route Frequency Provider Last Rate Last Dose  . acetaminophen (TYLENOL) tablet 650 mg  650 mg Oral Q6H PRN Theressa Millard, MD   650 mg at 02/05/14 2232   Or  . acetaminophen (TYLENOL) suppository 650 mg  650 mg Rectal Q6H PRN Theressa Millard, MD      . amLODipine (NORVASC) tablet 10 mg  10  mg Oral Daily Eugenie Filler, MD   10 mg at 02/09/14 1015  . carvedilol (COREG) tablet 25 mg  25 mg Oral BID WC Eugenie Filler, MD   25 mg at 02/09/14 0754  . DULoxetine (CYMBALTA) DR capsule 60 mg  60 mg Oral Daily Durward Parcel, MD   60 mg at 02/09/14 1015  . enoxaparin (LOVENOX) injection 40 mg  40 mg Subcutaneous Q24H Julieta Bellini Absher, [facility]  40 mg at 02/09/14 1016  . folic acid (FOLVITE) tablet 0.5 mg  0.5 mg Oral Daily Irine Seal V, MD   0.5 mg at 02/09/14 1015  . furosemide (LASIX) injection 40 mg  40 mg Intravenous BID Verlee Monte, MD   40 mg at 02/09/14 1202  . gi cocktail (Maalox,Lidocaine,Donnatal)  30 mL Oral TID PRN Eugenie Filler, MD   30 mL at 02/05/14 0802  . hydrALAZINE (APRESOLINE) injection 10 mg  10 mg Intravenous Q6H PRN Eugenie Filler, MD   10 mg at 02/05/14 0446  . HYDROmorphone (DILAUDID) injection 0.5-1 mg  0.5-1 mg Intravenous Q3H PRN Theressa Millard, MD   1 mg at 02/05/14 0540  . insulin aspart (novoLOG) injection 0-15 Units  0-15 Units Subcutaneous TID WC Eugenie Filler, MD   3 Units at 02/09/14 1204  . insulin aspart (novoLOG) injection 6 Units  6 Units Subcutaneous TID WC Eugenie Filler, MD   6 Units at 02/09/14 1203  . [START ON 02/10/2014] insulin glargine (LANTUS) injection 55 Units  55 Units Subcutaneous q morning - 10a Mutaz Elmahi, MD      . iron polysaccharides (NIFEREX) capsule 150 mg  150 mg Oral Daily Eugenie Filler, MD   150 mg at 02/09/14 1015  . levETIRAcetam (KEPPRA) tablet 500 mg  500 mg Oral BID Eugenie Filler, MD   500 mg at 02/09/14 1015  . LORazepam (ATIVAN) injection 1 mg  1 mg Intravenous Q4H PRN Theressa Millard, MD   1 mg at 02/03/14 2239  . OLANZapine zydis (ZYPREXA) disintegrating tablet 5 mg  5 mg Oral QHS Durward Parcel, MD   5 mg at 02/08/14 1727  . ondansetron (ZOFRAN) tablet 4 mg  4 mg Oral Q6H PRN Theressa Millard, MD       Or  . ondansetron (ZOFRAN) injection 4 mg  4 mg Intravenous  Q6H PRN Theressa Millard, MD      . pantoprazole (PROTONIX) EC tablet 40 mg  40 mg Oral Daily Eugenie Filler, MD   40 mg at 02/09/14 1015  . polyethylene glycol (MIRALAX / GLYCOLAX) packet 17 g  17 g Oral BID Eugenie Filler, MD   17 g at 02/09/14 1016  . pregabalin (LYRICA) capsule 75 mg  75 mg Oral BID Eugenie Filler, MD   75 mg at 02/09/14 1015  . sodium bicarbonate tablet 650 mg  650 mg Oral BID Eugenie Filler, MD   650 mg at 02/09/14 1015  . sodium chloride 0.9 % injection 3 mL  3 mL Intravenous Q12H Theressa Millard, MD   3 mL at 02/08/14 2209  . sodium chloride 0.9 % injection 3 mL  3 mL Intravenous PRN Theressa Millard, MD        Psychiatric Specialty Exam: Physical Exam  ROS  Blood pressure 131/77, pulse 102, temperature 98.6 F (37 C), temperature source Oral, resp. rate 18, height $RemoveBe'5\' 6"'rgbdaXfaS$  (1.676 m), weight 78.7 kg (173 lb 8 oz), SpO2 100.00%.Body mass index is 28.02 kg/(m^2).  General Appearance: Guarded  Eye Contact::  Good  Speech:  Clear and Coherent  Volume:  Decreased  Mood:  Depressed  Affect:  Appropriate and Congruent  Thought Process:  Coherent and Goal Directed  Orientation:  Full (Time, Place, and Person)  Thought Content:  WDL  Suicidal Thoughts:  No  Homicidal Thoughts:  No  Memory:  Immediate;   Fair  Judgement:  Intact  Insight:  Fair  Psychomotor Activity:  Psychomotor Retardation  Concentration:  Fair  Recall:  [place] of Knowledge:Fair  Language: Good  Akathisia:  NA  Handed:  Right  AIMS (if indicated):     Assets:  Communication Skills Desire for Improvement Housing Intimacy Leisure Time Physical Health Resilience Social Support  Sleep:      Musculoskeletal: Strength & Muscle Tone: within normal limits Gait & Station: normal Patient leans: N/A  Treatment Plan Summary: Daily contact with patient to assess and evaluate symptoms and progress in treatment Medication management  Brunette Lavalle,JANARDHAHA R. 02/09/2014  12:59 PM

## 2014-02-09 NOTE — Progress Notes (Signed)
Patient complained of increased swelling to her legs and arms. RN assessed patient, non-pitting edema noted. NP notified and new orders received to decrease fluids to 50 ml. Will continue to monitor.

## 2014-02-09 NOTE — Progress Notes (Signed)
PT Cancellation Note  Patient Details Name: [name] MRN: [id number] DOB: [birth year]   Cancelled Treatment:    Reason Eval/Treat Not Completed: Patient declined, no reason specified Pt declines stating LEs too swollen and she just received lasix.  She did state, "I know I need to, just not today."   Delois Tolbert,KATHrine E 02/09/2014, 2:02 PM Carmelia Bake, PT, DPT 02/09/2014 Pager: [phone]

## 2014-02-09 NOTE — Progress Notes (Signed)
Inpatient Diabetes Program Recommendations  AACE/ADA: New Consensus Statement on Inpatient Glycemic Control (2013)  Target Ranges:  Prepandial:   less than 140 mg/dL      Peak postprandial:   less than 180 mg/dL (1-2 hours)      Critically ill patients:  140 - 180 mg/dL  Results for [name], [name] (MRN [id number]) as of 02/09/2014 09:38  Ref. Range 02/09/2014 07:22  Glucose-Capillary Latest Range: 70-99 mg/dL 307 (H)   Consider increasing Lantus to 50 units.   Thank you  Raoul Pitch BSN, RN,CDE Inpatient Diabetes Coordinator [phone] (team pager)

## 2014-02-09 NOTE — Progress Notes (Signed)
[facility] PROGRESS NOTE  [name] [id number] DOB: [birth year] DOA: 02/01/2014 PCP: Maurice Small, D, MD   HPI/Subjective: Overall feeling much better, complains about swelling in her arms and legs.  Assessment/Plan:  Seizures -Likely secondary to noncompliance. Patient stated had not been on the [place] for over a year.  -No seizures overnight. CT head negative. MRI of the head with no acute abnormalities.  -No signs or symptoms of infection. Phosphorus level repleted. Continue oral Keppra. PT/OT.   Uncontrolled type 2 diabetes with hyperosmolar hyperglycemia -Likely secondary to medical noncompliance.  -Hemoglobin A1c is 12.4 which is an increase from 9.2 last year 02/09/2013. Patient currently off the glucometer.  -CBGs have ranged from 103 - 150. Continue Lantus 45 units daily.  -Continue sliding scale insulin. -Currently on 45 units of Lantus, we'll increase it to 55 units.  Hypertensive emergency Likely also secondary to medical noncompliance. Continue Coreg 25 mg twice daily, Norvasc 10 mg daily.  Acute on chronic kidney disease stage III Likely secondary to a prerenal azotemia. Renal ultrasound negative for hydronephrosis. Patient noted to be orthostatic. Renal function worsening. Will discontinue Symbyax.  Hydrate with IV fluids. IV fluid discontinued after patient developed lower extremity edema, we'll give one dose of Lasix.  Hypophosphatemia Repleted.  Iron deficiency anemia/aocd Anemia panel consistent with iron deficiency anemia. No overt GI bleed. H&H is stable. Follow H&H. Transfusion threshold hemoglobin less than 7.  History of bipolar disorder/schizoaffective disorder Patient with some complaints of visual hallucinations. Patient has been seen by psychiatry and patient Cymbalta dose was decreased to 30 mg daily. Symbyax has been discontinued secondary to orthostasis. Patient has been started on low-dose Zyprexa per psychiatry. Continue Cymbalta.  Patient will need outpatient psychiatric followup. Psychiatry following and appreciate input and recommendations.  Orthostasis Patient noted to be orthostatic yesterday. Patient blood pressure is borderline. May be secondary to recently started symbax. Will discontinue  Symbax. IV fluids. Repeat orthostasis in the morning. Monitor closely for volume overload.   Prophylaxis Lovenox for DVT prophylaxis.    Code Status: Full Family Communication: Updated patient no family at bedside. Disposition Plan: To skilled nursing facility when medically stable.   Consultants:  Neurology: Dr. Doy Mince 02/02/2014  Psychiatry: Dr. Louretta Shorten 02/06/2014  Procedures:  MRI head 02/02/2014  CT head 02/02/2014  Chest x-ray 02/02/2014  Renal ultrasound 02/02/2014  Antibiotics:  None   Objective: Filed Vitals:   02/09/14 0754  BP: 131/77  Pulse: 102  Temp:   Resp:     Intake/Output Summary (Last 24 hours) at 02/09/14 1054 Last data filed at 02/09/14 0641  Gross per 24 hour  Intake 2227.5 ml  Output   1950 ml  Net  277.5 ml   Filed Weights   02/02/14 0510 02/03/14 0400 02/03/14 1127  Weight: 73.9 kg (162 lb 14.7 oz) 77.4 kg (170 lb 10.2 oz) 78.7 kg (173 lb 8 oz)    Exam:   General:  NAD  Cardiovascular: RRR  Respiratory: CTAB  Abdomen: Soft, nontender, mildly distended, positive bowel sounds.  Musculoskeletal: No clubbing cyanosis or edema. Status post left foot transmetatarsal amputation.  Data Reviewed: Basic Metabolic Panel:  Recent Labs Lab 02/04/14 0453 02/05/14 0426 02/06/14 0440 02/07/14 0459 02/08/14 0425 02/08/14 1507 02/09/14 0415  NA 136* 131* 131* 132* 136* 140 133*  K 4.0 4.4 4.2 5.0 4.5 4.4 4.8  CL 107 101 101 102 106 111 104  CO2 20 20 18* 21 21 20 20   GLUCOSE 253* 539* 312* 353* 155* 91  294*  BUN 22 19 20  25* 33* 34* 35*  CREATININE 1.95* 1.84* 1.73* 2.12* 2.63* 2.50* 2.38*  CALCIUM 7.1* 7.0* 7.1* 7.2* 7.4* 7.6* 7.7*  MG 1.6 1.8  --    --   --   --   --   PHOS 3.6  --   --   --   --   --   --    Liver Function Tests: No results found for this basename: AST, ALT, ALKPHOS, BILITOT, PROT, ALBUMIN,  in the last 168 hours No results found for this basename: LIPASE, AMYLASE,  in the last 168 hours No results found for this basename: AMMONIA,  in the last 168 hours CBC:  Recent Labs Lab 02/03/14 0400 02/04/14 0453 02/05/14 0426 02/06/14 0440 02/08/14 0425 02/09/14 0415  WBC 4.8 3.4* 2.8* 3.5* 3.3* 3.2*  NEUTROABS 2.4  --   --   --   --   --   HGB 9.5* 9.3* 9.8* 9.4* 8.5* 8.7*  HCT 28.1* 27.4* 30.3* 28.4* 26.4* 27.0*  MCV 83.4 83.8 85.4 84.0 87.1 87.7  PLT 248 221 208 191 167 189   Cardiac Enzymes: No results found for this basename: CKTOTAL, CKMB, CKMBINDEX, TROPONINI,  in the last 168 hours BNP (last 3 results)  Recent Labs  06/01/13 1619  PROBNP 1210.0*   CBG:  Recent Labs Lab 02/08/14 0714 02/08/14 1134 02/08/14 1700 02/08/14 2155 02/09/14 0722  GLUCAP 150* 128* 122* 175* 307*    Recent Results (from the past 240 hour(s))  MRSA PCR SCREENING     Status: None   Collection Time    02/02/14  5:29 AM      Result Value Ref Range Status   MRSA by PCR NEGATIVE  NEGATIVE Final   Comment:            The GeneXpert MRSA Assay (FDA     approved for NASAL specimens     only), is one component of a     comprehensive MRSA colonization     surveillance program. It is not     intended to diagnose MRSA     infection nor to guide or     monitor treatment for     MRSA infections.     Performed at [facility]     Status: None   Collection Time    02/02/14 12:00 PM      Result Value Ref Range Status   Specimen Description URINE, RANDOM   Final   Special Requests NONE   Final   Culture  Setup Time     Final   Value: 02/02/2014 14:20     Performed at [facility]     Final   Value: NO GROWTH     Performed at [facility]   Culture     Final    Value: NO GROWTH     Performed at [facility]   Report Status 02/03/2014 FINAL   Final  MRSA PCR SCREENING     Status: None   Collection Time    02/06/14  3:04 AM      Result Value Ref Range Status   MRSA by PCR NEGATIVE  NEGATIVE Final   Comment:            The GeneXpert MRSA Assay (FDA     approved for NASAL specimens     only), is one component of a  comprehensive MRSA colonization     surveillance program. It is not     intended to diagnose MRSA     infection nor to guide or     monitor treatment for     MRSA infections.     Studies: No results found.  Scheduled Meds: . amLODipine  10 mg Oral Daily  . carvedilol  25 mg Oral BID WC  . DULoxetine  60 mg Oral Daily  . enoxaparin (LOVENOX) injection  40 mg Subcutaneous Q24H  . folic acid  0.5 mg Oral Daily  . insulin aspart  0-15 Units Subcutaneous TID WC  . insulin aspart  6 Units Subcutaneous TID WC  . insulin glargine  45 Units Subcutaneous q morning - 10a  . iron polysaccharides  150 mg Oral Daily  . levETIRAcetam  500 mg Oral BID  . OLANZapine zydis  5 mg Oral QHS  . pantoprazole  40 mg Oral Daily  . polyethylene glycol  17 g Oral BID  . pregabalin  75 mg Oral BID  . sodium bicarbonate  650 mg Oral BID  . sodium chloride  3 mL Intravenous Q12H   Continuous Infusions:    Principal Problem:   Seizures Active Problems:   DM (diabetes mellitus), type 2, uncontrolled   Hypertension, accelerated   Normocytic anemia   Hypertensive emergency   CKD (chronic kidney disease), stage III   Noncompliance   Uncontrolled type 2 diabetes mellitus with hyperosmolar nonketotic hyperglycemia   Renal failure (ARF), acute on chronic   Bipolar disorder, unspecified   Schizoaffective disorder, unspecified condition    Time spent: Brook Highland A MD [facility] Pager [phone]. If 7PM-7AM, please contact night-coverage at [email or web address], password [facility] 02/09/2014, 10:54 AM  LOS: 8 days

## 2014-02-09 NOTE — Progress Notes (Signed)
Clinical Social Work Progress Note PSYCHIATRY SERVICE LINE 02/09/2014  Patient:  [name]  Account:  [id number]  [place] Date:  02/01/2014  Clinical Social Worker:  Sindy Messing, LCSW  Date/Time:  02/09/2014 03:15 PM  Review of Patient  Overall Medical Condition:   Patient reports she is feeling better and hopeful to DC to SNF tomorrow.   Participation Level:  Active  Participation Quality  Appropriate   Other Participation Quality:   Affect  Appropriate   Cognitive  Appropriate   Reaction to Medications/Concerns:   Patient reports she is unsure about medication changes that have been made.   Modes of Intervention  Support   Summary of Progress/Plan at Discharge   CSW met with patient at bedside. Patient laying in bed and napping when CSW arrived. Patient reports she is hopeful that medical concerns are resolved by tomorrow so that she can DC to SNF.    CSW spoke with patient about emotional wellbeing. Patient reports she is taking her medications as prescribed and that her hallucinations have disappeared. Patient denies any current AH or VH. Patient states she does not know what medication changes were made but states she has been feeling less drowsy.    CSW agreeable to follow up but patient reports no current concerns.      [place], [place] [phone]

## 2014-02-09 NOTE — Progress Notes (Addendum)
CSW continuing to follow for disposition planning.   Pt plans to d/c to [facility] for short term rehab when medically stable for discharge.  Per MD, pt not yet medically ready for discharge today, but anticipation that pt will be medically ready tomorrow.  CSW met with pt at bedside to update pt. Pt eager to go to rehab and get stronger in order to return home.  CSW updated [employer]  CSW to continue to follow and facilitate pt discharge needs when pt medically ready for discharge.  Alison Murray, MSW, LCSW Clinical Social Work Coverage for [employer] [phone]

## 2014-02-10 DIAGNOSIS — N390 Urinary tract infection, site not specified: Secondary | ICD-10-CM

## 2014-02-10 DIAGNOSIS — E1159 Type 2 diabetes mellitus with other circulatory complications: Secondary | ICD-10-CM

## 2014-02-10 DIAGNOSIS — E860 Dehydration: Secondary | ICD-10-CM

## 2014-02-10 LAB — BASIC METABOLIC PANEL
Anion gap: 11 (ref 5–15)
BUN: 36 mg/dL — AB (ref 6–23)
CO2: 19 meq/L (ref 19–32)
Calcium: 7.5 mg/dL — ABNORMAL LOW (ref 8.4–10.5)
Chloride: 98 mEq/L (ref 96–112)
Creatinine, Ser: 2.45 mg/dL — ABNORMAL HIGH (ref 0.50–1.10)
GFR calc Af Amer: 26 mL/min — ABNORMAL LOW (ref >90)
GFR, EST NON AFRICAN AMERICAN: 22 mL/min — AB (ref >90)
GLUCOSE: 268 mg/dL — AB (ref 70–99)
POTASSIUM: 4.5 meq/L (ref 3.7–5.3)
Sodium: 128 mEq/L — ABNORMAL LOW (ref 137–147)

## 2014-02-10 LAB — CBC
HCT: 25.5 % — ABNORMAL LOW (ref 36.0–46.0)
Hemoglobin: 8.2 g/dL — ABNORMAL LOW (ref 12.0–15.0)
MCH: 28.1 pg (ref 26.0–34.0)
MCHC: 32.2 g/dL (ref 30.0–36.0)
MCV: 87.3 fL (ref 78.0–100.0)
PLATELETS: 176 10*3/uL (ref 150–400)
RBC: 2.92 MIL/uL — ABNORMAL LOW (ref 3.87–5.11)
RDW: 15.3 % (ref 11.5–15.5)
WBC: 3.8 10*3/uL — ABNORMAL LOW (ref 4.0–10.5)

## 2014-02-10 LAB — GLUCOSE, CAPILLARY: Glucose-Capillary: 244 mg/dL — ABNORMAL HIGH (ref 70–99)

## 2014-02-10 MED ORDER — DULOXETINE HCL 30 MG PO CPEP: 30.0000 mg | ORAL_CAPSULE | Freq: Every day | ORAL | Status: DC

## 2014-02-10 MED ORDER — OLANZAPINE 5 MG PO TBDP: 5.0000 mg | ORAL_TABLET | Freq: Every day | ORAL | Status: DC

## 2014-02-10 MED ORDER — INSULIN GLARGINE 100 UNIT/ML ~~LOC~~ SOLN: 55.0000 [IU] | Freq: Every day | SUBCUTANEOUS | Status: DC

## 2014-02-10 MED ORDER — FUROSEMIDE 20 MG PO TABS: 20.0000 mg | ORAL_TABLET | Freq: Every day | ORAL | Status: DC

## 2014-02-10 MED ORDER — INSULIN ASPART 100 UNIT/ML ~~LOC~~ SOLN: 10.0000 [IU] | Freq: Three times a day (TID) | SUBCUTANEOUS | Status: DC

## 2014-02-10 NOTE — Progress Notes (Signed)
Clinical Social Work Department CLINICAL SOCIAL WORK PLACEMENT NOTE 02/10/2014  Patient:  [name], [name]  Account Number:  [id number] Admit date:  02/01/2014  Clinical Social Worker:  Ulyess Blossom  Date/time:  02/10/2014 12:08 PM  Clinical Social Work is seeking post-discharge placement for this patient at the following level of care:   SKILLED NURSING   (*CSW will update this form in Epic as items are completed)   02/05/2014  Patient/family provided with [place] Department of Clinical Social Work's list of facilities offering this level of care within the geographic area requested by the patient (or if unable, by the patient's family).  02/05/2014  Patient/family informed of their freedom to choose among providers that offer the needed level of care, that participate in Medicare, Medicaid or managed care program needed by the patient, have an available bed and are willing to accept the patient.  02/05/2014  Patient/family informed of [facility]' ownership interest in [facility], as well as of the fact that they are under no obligation to receive care at this facility.  PASARR submitted to EDS on 02/05/2014 PASARR number received on 02/05/2014  FL2 transmitted to all facilities in geographic area requested by pt/family on  02/05/2014 FL2 transmitted to all facilities within larger geographic area on   Patient informed that his/her managed care company has contracts with or will negotiate with  certain facilities, including the following:     Patient/family informed of bed offers received:  02/07/2014 Patient chooses bed at [facility] Physician recommends and patient chooses bed at    Patient to be transferred to [facility] on  02/10/2014 Patient to be transferred to facility by ambulance [facility]) Patient and family notified of transfer on 02/10/2014 Name of family member notified:  pt at bedside, pt daughter, Tiffany via  telephone  The following physician request were entered in Epic:   Additional Comments:   Alison Murray, MSW, LCSW Clinical Social Work Coverage for [employer]

## 2014-02-10 NOTE — Progress Notes (Signed)
Report called to [facility]. Hospital course reviewed and all questions answered. Callie Fielding RN

## 2014-02-10 NOTE — Progress Notes (Signed)
Pt for discharge to [facility].  CSW facilitated pt discharge needs including contacting facility, faxing pt discharge information via [facility], discussing with pt at bedside and notifying pt daughter, Tiffany via telephone, providing RN phone number to call report, and arranging ambulance transport via [place]. (Service Request ID#: [id number]).  Pt eager to get to rehab at [facility] in order to get stronger and return home. Pt expressed her motivation and realization that she needs the rehab prior to her return home. Supportive counseling provided.  No further social work needs identified at this time.  CSW signing off.   Alison Murray, MSW, [place] Work [phone]

## 2014-02-10 NOTE — Progress Notes (Signed)
Occupational Therapy Treatment Patient Details Name: JAZARI CEPIN MRN: [id number] DOB: [birth year] Today's Date: 02/10/2014    History of present illness 48 yo female admitted with seizure, HTN, hyperglycemia. hx of L midfoot amputation 2014, seizure, neuropathy, bipolar d/o, glaucoma, DM, HTN, Hep C   OT comments  Session focused on safety with RW  Follow Up Recommendations  SNF    Equipment Recommendations  None recommended by OT    Recommendations for Other Services      Precautions / Restrictions Precautions Precautions: Fall Precaution Comments: Seziure       Mobility Bed Mobility                  Transfers       Sit to Stand: Supervision              Balance                                   ADL                                         General ADL Comments: worked on retrieving items from [employer] and closet, Pharmacist, hospital.  Pt donned bariatric non-slip socks due to swelling.      Vision                 Additional Comments: pt denies diplopia.  States she had it a llittle bit earlier. She has visual changes, mostly blurriness from DM   Perception     Praxis      Cognition   Behavior During Therapy: WFL for tasks assessed/performed Overall Cognitive Status: No family/caregiver present to determine baseline cognitive functioning                       Extremity/Trunk Assessment               Exercises     Shoulder Instructions       General Comments      Pertinent Vitals/ Pain       No c/o pain but uncomfortable with swelling (abdomen)  Home Living                                          Prior Functioning/Environment              Frequency Min 2X/week     Progress Toward Goals  OT Goals(current goals can now be found in the care plan section)  Progress towards OT goals: Progressing toward goals     Plan Discharge plan needs  to be updated    Co-evaluation                 End of Session     Activity Tolerance     Patient Left     Nurse Communication          Time: [id number] OT Time Calculation (min): 17 min  Charges: OT General Charges $OT Visit: 1 Procedure OT Treatments $Therapeutic Activity: 8-22 mins  Tra Wilemon 02/10/2014, 10:10 AM   Lesle Chris, OTR/L [phone] 02/10/2014

## 2014-02-10 NOTE — Discharge Summary (Signed)
Physician Discharge Summary  [name] [id number] DOB: Dec 24, 1965 DOA: 02/01/2014  PCP: Maurice Small, D, MD  Admit date: 02/01/2014 Discharge date: 02/10/2014  Time spent: 40 minutes  Recommendations for Outpatient Follow-up:  1. Followup with primary care physician and nephrology within one to 2 weeks. 2. Check BMP within one week, patient discharge and 20 mg of Lasix.  Discharge Diagnoses:  Principal Problem:   Seizures Active Problems:   DM (diabetes mellitus), type 2, uncontrolled   Hypertension, accelerated   Normocytic anemia   Hypertensive emergency   CKD (chronic kidney disease), stage III   Noncompliance   Uncontrolled type 2 diabetes mellitus with hyperosmolar nonketotic hyperglycemia   Renal failure (ARF), acute on chronic   Bipolar disorder, unspecified   Schizoaffective disorder, unspecified condition   Discharge Condition: Stable  Diet recommendation: Carb modified diet  Filed Weights   02/02/14 0510 02/03/14 0400 02/03/14 1127  Weight: 73.9 kg (162 lb 14.7 oz) 77.4 kg (170 lb 10.2 oz) 78.7 kg (173 lb 8 oz)    History of present illness:  [name] is a 48 y.o. female with a history of Seizures, IDDM, HTN, CHF, and Bipolar Disorder who was brought to the ED after her daughter witnessed her having a seizure. She arrived to the ED and was post -Ictal with Blood pressures measuring 220/120. She was given Labetalol and had transient improvement but her blood pressure rebounded back into the 0000000 systolic. She was then placed on a Cardene drip for blood pressure control. Her lab studies revealed a glucose levelof 669 and she was placed on the glucose stabilizer. She was referred for medical admission. Her son is at the bedside and he is assisting with the medical history.   Hospital Course:   Seizures  -Likely secondary to noncompliance. Patient stated had not been on the [place] for over a year.  -No seizures overnight. CT head negative. MRI of the head  with no acute abnormalities.  -No signs or symptoms of infection. Phosphorus level repleted. Continue oral Keppra. PT/OT.  -Seizure activity resolved also the hospital. Continue Keppra as outpatient.  Uncontrolled type 2 diabetes with hyperosmolar hyperglycemia  -Likely secondary to medical noncompliance.  -Hemoglobin A1c is 12.4 which is an increase from 9.2 last year 02/09/2013. -Initially started on intensive IV insulin therapy, then transitioned to subcutaneous insulin regimen.  -CBGs were in the high side when she was restarted on her home dose of the insulin. -Lantus insulin increased to 55 units and NovoLog insulin increased to 10 units with meals.  Hypertensive emergency  -Likely also secondary to medical noncompliance. Continue Coreg 25 mg twice daily, Norvasc 10 mg daily.  -Blood pressure controlled at the time of discharge.  Acute on chronic kidney disease stage III  -Likely secondary to a prerenal azotemia. Renal ultrasound negative for hydronephrosis. Patient noted to be orthostatic. -Renal function worsening. Will discontinue Symbyax.  -Initially started on IV fluids, IVF discontinued after patient developed lower extremity edema, we'll give one dose of Lasix. -Started on low dose of Lasix, discharge on 20 mg of oral Lasix. -Patient will followup with nephrology as outpatient. -Baseline creatinine is about 1.8, worsening creatinine since last year secondary to diabetes and hypertension. -ACE inhibitors or ARB couldn't be started because of worsening creatinine.   Hypophosphatemia  Repleted.   Iron deficiency anemia/aocd  -Anemia panel consistent with iron deficiency anemia.  -No overt GI bleed. H&H is stable. Follow H&H. Transfusion threshold hemoglobin less than 7.   History of  bipolar disorder/schizoaffective disorder  Patient with some complaints of visual hallucinations. Patient has been seen by psychiatry and patient Cymbalta dose was decreased to 30 mg daily.  Symbyax has been discontinued secondary to orthostasis. Patient has been started on low-dose Zyprexa per psychiatry. Continue Cymbalta. Patient will need outpatient psychiatric followup. Psychiatry following and appreciate input and recommendations.   Orthostasis  Patient noted to be orthostatic yesterday. Patient blood pressure is borderline. May be secondary to recently started symbax. Will discontinue Symbax. IV fluids. Repeat orthostasis in the morning. Monitor closely for volume overload.      Procedures:  None  Consultations:  Neurology.  Psychiatry.  Discharge Exam: Filed Vitals:   02/10/14 0550  BP: 155/88  Pulse: 96  Temp: 97.9 F (36.6 C)  Resp: 18   General: Alert and awake, oriented x3, not in any acute distress. HEENT: anicteric sclera, pupils reactive to light and accommodation, EOMI CVS: S1-S2 clear, no murmur rubs or gallops Chest: clear to auscultation bilaterally, no wheezing, rales or rhonchi Abdomen: soft nontender, nondistended, normal bowel sounds, no organomegaly Extremities: There is +1 edema noted bilaterally, S/P left transmetatarsal amputation Neuro: Cranial nerves II-XII intact, no focal neurological deficits  Discharge Instructions You were cared for by a hospitalist during your hospital stay. If you have any questions about your discharge medications or the care you received while you were in the hospital after you are discharged, you can call the unit and asked to speak with the hospitalist on call if the hospitalist that took care of you is not available. Once you are discharged, your primary care physician will handle any further medical issues. Please note that NO REFILLS for any discharge medications will be authorized once you are discharged, as it is imperative that you return to your primary care physician (or establish a relationship with a primary care physician if you do not have one) for your aftercare needs so that they can reassess your  need for medications and monitor your lab values.      Discharge Instructions   Diet Carb Modified    Complete by:  As directed      Increase activity slowly    Complete by:  As directed             Medication List    STOP taking these medications       asenapine 5 MG Subl 24 hr tablet  Commonly known as:  SAPHRIS     clindamycin 150 MG capsule  Commonly known as:  CLEOCIN      TAKE these medications       BILBERRY FRUIT PO  Take 1 tablet by mouth daily.     BLACK COHOSH EXTRACT PO  Take 2 tablets by mouth daily.     carvedilol 12.5 MG tablet  Commonly known as:  COREG  Take 12.5 mg by mouth 2 (two) times daily with a meal.     cloNIDine 0.1 MG tablet  Commonly known as:  CATAPRES  Take 0.1 mg by mouth 3 (three) times daily as needed.     DULoxetine 30 MG capsule  Commonly known as:  CYMBALTA  Take 1 capsule (30 mg total) by mouth daily.     folic acid [id number] MCG tablet  Commonly known as:  FOLVITE  Take 400 mcg by mouth daily.     furosemide 20 MG tablet  Commonly known as:  LASIX  Take 1 tablet (20 mg total) by mouth daily.     insulin  aspart 100 UNIT/ML injection  Commonly known as:  novoLOG  Inject 10 Units into the skin 3 (three) times daily before meals. Sliding scale     insulin glargine 100 UNIT/ML injection  Commonly known as:  LANTUS  Inject 0.55 mLs (55 Units total) into the skin at bedtime.     levETIRAcetam 500 MG tablet  Commonly known as:  KEPPRA  Take 1 tablet (500 mg total) by mouth 2 (two) times daily.     multivitamin with minerals Tabs tablet  Take 1 tablet by mouth daily.     OLANZapine zydis 5 MG disintegrating tablet  Commonly known as:  ZYPREXA  Take 1 tablet (5 mg total) by mouth at bedtime.     polyethylene glycol packet  Commonly known as:  MIRALAX / GLYCOLAX  Take 17 g by mouth 2 (two) times daily as needed for mild constipation.     pregabalin 75 MG capsule  Commonly known as:  LYRICA  Take 75 mg by mouth 2 (two)  times daily.       Allergies  Allergen Reactions  . Gabapentin Itching and Swelling  . Saphris [Asenapine] Swelling    Swelling of the face  . Tramadol     Causes legs to swell  . Penicillins Hives      The results of significant diagnostics from this hospitalization (including imaging, microbiology, ancillary and laboratory) are listed below for reference.    Significant Diagnostic Studies: Ct Head Wo Contrast  02/02/2014   CLINICAL DATA:  Seizure.  Altered mental status.  EXAM: CT HEAD WITHOUT CONTRAST  TECHNIQUE: Contiguous axial images were obtained from the base of the skull through the vertex without intravenous contrast.  COMPARISON:  02/09/2013  FINDINGS: Skull and Sinuses:Negative for fracture or destructive process. The mastoids, middle ears, and imaged paranasal sinuses are clear.  Orbits: Bilateral cataract resection.  Brain: No evidence of acute abnormality, such as acute infarction, hemorrhage, hydrocephalus, or mass lesion/mass effect. There is chronic enlargement of the sylvian fissures bilaterally. On brain MRI from 2009 there was no definitive polymicrogyria in this region. There is a prominent medial left occipital lobe sulcus, also stable. Dilated perivascular space noted in the low left cerebrum.  IMPRESSION: 1. No acute intracranial findings. 2. Brain atrophy with a perisylvian predominance.   Electronically Signed   By: Jorje Guild M.D.   On: 02/02/2014 00:54   Mr Brain Wo Contrast  02/02/2014   CLINICAL DATA:  Seizure.  Altered mental status.  EXAM: MRI HEAD WITHOUT CONTRAST  TECHNIQUE: Multiplanar, multiecho pulse sequences of the brain and surrounding structures were obtained without intravenous contrast.  COMPARISON:  Head CT 02/02/2014 and MRI 02/13/2008  FINDINGS: Dedicated thin section imaging through the temporal lobes demonstrates normal volume and signal of the hippocampi. There is no evidence of heterotopia.  There is no evidence of acute infarct,  intracranial hemorrhage, mass, midline shift, or extra-axial fluid collection. Symmetric, temporoparietal predominant atrophy with enlargement of the sylvian fissures is again seen and slightly progressed from the prior MRI. Foci of T2 hyperintensity in the periventricular white matter and pons are similar to the prior MRI and nonspecific but compatible with mild chronic small vessel ischemic disease. Dilated perivascular space at the inferior left basal ganglia is unchanged.  Prior bilateral cataract extraction is noted. Major intracranial vascular flow voids are preserved. Paranasal sinuses and mastoid air cells are clear.  IMPRESSION: 1. No evidence of acute intracranial abnormality or mass. 2. Cerebral atrophy as above, mildly progressed  from prior MRI.   Electronically Signed   By: Logan Bores   On: 02/02/2014 20:55   US Renal  02/02/2014   CLINICAL DATA:  Acute renal failure.  EXAM: RENAL/URINARY TRACT ULTRASOUND COMPLETE  COMPARISON:  None.  FINDINGS: Right Kidney:  Length: 11.5 cm. Renal parenchyma is slightly echogenic suggesting medical renal disease. No mass or hydronephrosis visualized.  Left Kidney:  Length: 10.6 cm. Renal parenchyma is slightly echogenic suggesting medical renal disease. No mass or hydronephrosis visualized.  Bladder:  Is fully distended, with echogenic material seen in the base of the bladder. No ureteral jets are noted.  IMPRESSION: Renal parenchyma is slightly echogenic bilaterally suggesting medical renal disease. No hydronephrosis or renal obstruction is noted.   Electronically Signed   By: Sabino Dick M.D.   On: 02/02/2014 14:51   Dg Chest Port 1 View  02/02/2014   CLINICAL DATA:  Hyperglycemia  EXAM: PORTABLE CHEST - 1 VIEW  COMPARISON:  06/01/2013  FINDINGS: Cardiac enlargement without heart failure. Mild left lower lobe atelectasis. Negative for pneumonia or effusion.  IMPRESSION: Mild left lower lobe atelectasis.   Electronically Signed   By: Franchot Gallo M.D.   On:  02/02/2014 10:33    Microbiology: Recent Results (from the past 240 hour(s))  MRSA PCR SCREENING     Status: None   Collection Time    02/02/14  5:29 AM      Result Value Ref Range Status   MRSA by PCR NEGATIVE  NEGATIVE Final   Comment:            The GeneXpert MRSA Assay (FDA     approved for NASAL specimens     only), is one component of a     comprehensive MRSA colonization     surveillance program. It is not     intended to diagnose MRSA     infection nor to guide or     monitor treatment for     MRSA infections.     Performed at [facility]     Status: None   Collection Time    02/02/14 12:00 PM      Result Value Ref Range Status   Specimen Description URINE, RANDOM   Final   Special Requests NONE   Final   Culture  Setup Time     Final   Value: 02/02/2014 14:20     Performed at [facility]     Final   Value: NO GROWTH     Performed at [facility]   Culture     Final   Value: NO GROWTH     Performed at [facility]   Report Status 02/03/2014 FINAL   Final  MRSA PCR SCREENING     Status: None   Collection Time    02/06/14  3:04 AM      Result Value Ref Range Status   MRSA by PCR NEGATIVE  NEGATIVE Final   Comment:            The GeneXpert MRSA Assay (FDA     approved for NASAL specimens     only), is one component of a     comprehensive MRSA colonization     surveillance program. It is not     intended to diagnose MRSA     infection nor to guide or     monitor treatment for     MRSA infections.  URINE CULTURE     Status: None   Collection Time    02/08/14 10:02 AM      Result Value Ref Range Status   Specimen Description URINE, CLEAN CATCH   Final   Special Requests NONE   Final   Culture  Setup Time     Final   Value: 02/08/2014 13:08     Performed at [facility]     Final   Value: 80,000 COLONIES/ML     Performed at [facility]   Culture     Final    Value: LACTOBACILLUS SPECIES     Note: Standardized susceptibility testing for this organism is not available.     Performed at [facility]   Report Status 02/09/2014 FINAL   Final     Labs: Basic Metabolic Panel:  Recent Labs Lab 02/04/14 0453 02/05/14 0426  02/07/14 0459 02/08/14 0425 02/08/14 1507 02/09/14 0415 02/10/14 0435  NA 136* 131*  < > 132* 136* 140 133* 128*  K 4.0 4.4  < > 5.0 4.5 4.4 4.8 4.5  CL 107 101  < > 102 106 111 104 98  CO2 20 20  < > 21 21 20 20 19   GLUCOSE 253* 539*  < > 353* 155* 91 294* 268*  BUN 22 19  < > 25* 33* 34* 35* 36*  CREATININE 1.95* 1.84*  < > 2.12* 2.63* 2.50* 2.38* 2.45*  CALCIUM 7.1* 7.0*  < > 7.2* 7.4* 7.6* 7.7* 7.5*  MG 1.6 1.8  --   --   --   --   --   --   PHOS 3.6  --   --   --   --   --   --   --   < > = values in this interval not displayed. Liver Function Tests: No results found for this basename: AST, ALT, ALKPHOS, BILITOT, PROT, ALBUMIN,  in the last 168 hours No results found for this basename: LIPASE, AMYLASE,  in the last 168 hours No results found for this basename: AMMONIA,  in the last 168 hours CBC:  Recent Labs Lab 02/05/14 0426 02/06/14 0440 02/08/14 0425 02/09/14 0415 02/10/14 0435  WBC 2.8* 3.5* 3.3* 3.2* 3.8*  HGB 9.8* 9.4* 8.5* 8.7* 8.2*  HCT 30.3* 28.4* 26.4* 27.0* 25.5*  MCV 85.4 84.0 87.1 87.7 87.3  PLT 208 191 167 189 176   Cardiac Enzymes: No results found for this basename: CKTOTAL, CKMB, CKMBINDEX, TROPONINI,  in the last 168 hours BNP: BNP (last 3 results)  Recent Labs  06/01/13 1619  PROBNP 1210.0*   CBG:  Recent Labs Lab 02/09/14 0722 02/09/14 1144 02/09/14 1639 02/09/14 2043 02/10/14 0743  GLUCAP 307* 184* 204* 240* 244*       Signed:  Tashanti Dalporto A  [facility] 02/10/2014, 11:09 AM

## 2014-02-15 ENCOUNTER — Emergency Department (HOSPITAL_COMMUNITY)
Admission: EM | Admit: 2014-02-15 | Discharge: 2014-02-15 | Disposition: A | Discharge status: Home / Self Care | Payer: Medicare Other | Attending: Emergency Medicine | Admitting: Emergency Medicine

## 2014-02-15 ENCOUNTER — Encounter (HOSPITAL_COMMUNITY): Payer: Self-pay | Admitting: Emergency Medicine

## 2014-02-15 ENCOUNTER — Emergency Department (HOSPITAL_COMMUNITY): Payer: Medicare Other

## 2014-02-15 DIAGNOSIS — Y929 Unspecified place or not applicable: Secondary | ICD-10-CM | POA: Insufficient documentation

## 2014-02-15 DIAGNOSIS — I509 Heart failure, unspecified: Secondary | ICD-10-CM | POA: Insufficient documentation

## 2014-02-15 DIAGNOSIS — Z8614 Personal history of Methicillin resistant Staphylococcus aureus infection: Secondary | ICD-10-CM | POA: Insufficient documentation

## 2014-02-15 DIAGNOSIS — F17200 Nicotine dependence, unspecified, uncomplicated: Secondary | ICD-10-CM | POA: Diagnosis not present

## 2014-02-15 DIAGNOSIS — R296 Repeated falls: Secondary | ICD-10-CM | POA: Insufficient documentation

## 2014-02-15 DIAGNOSIS — Y9389 Activity, other specified: Secondary | ICD-10-CM | POA: Diagnosis not present

## 2014-02-15 DIAGNOSIS — S42463A Displaced fracture of medial condyle of unspecified humerus, initial encounter for closed fracture: Secondary | ICD-10-CM | POA: Diagnosis not present

## 2014-02-15 DIAGNOSIS — E1142 Type 2 diabetes mellitus with diabetic polyneuropathy: Secondary | ICD-10-CM | POA: Diagnosis not present

## 2014-02-15 DIAGNOSIS — F319 Bipolar disorder, unspecified: Secondary | ICD-10-CM | POA: Diagnosis not present

## 2014-02-15 DIAGNOSIS — F209 Schizophrenia, unspecified: Secondary | ICD-10-CM | POA: Insufficient documentation

## 2014-02-15 DIAGNOSIS — X500XXA Overexertion from strenuous movement or load, initial encounter: Secondary | ICD-10-CM | POA: Insufficient documentation

## 2014-02-15 DIAGNOSIS — S42461A Displaced fracture of medial condyle of right humerus, initial encounter for closed fracture: Secondary | ICD-10-CM

## 2014-02-15 DIAGNOSIS — E1149 Type 2 diabetes mellitus with other diabetic neurological complication: Secondary | ICD-10-CM | POA: Insufficient documentation

## 2014-02-15 DIAGNOSIS — N189 Chronic kidney disease, unspecified: Secondary | ICD-10-CM | POA: Insufficient documentation

## 2014-02-15 DIAGNOSIS — Z8619 Personal history of other infectious and parasitic diseases: Secondary | ICD-10-CM | POA: Insufficient documentation

## 2014-02-15 DIAGNOSIS — Z794 Long term (current) use of insulin: Secondary | ICD-10-CM | POA: Diagnosis not present

## 2014-02-15 DIAGNOSIS — Z8673 Personal history of transient ischemic attack (TIA), and cerebral infarction without residual deficits: Secondary | ICD-10-CM | POA: Insufficient documentation

## 2014-02-15 DIAGNOSIS — Z79899 Other long term (current) drug therapy: Secondary | ICD-10-CM | POA: Diagnosis not present

## 2014-02-15 DIAGNOSIS — I129 Hypertensive chronic kidney disease with stage 1 through stage 4 chronic kidney disease, or unspecified chronic kidney disease: Secondary | ICD-10-CM | POA: Insufficient documentation

## 2014-02-15 DIAGNOSIS — G40909 Epilepsy, unspecified, not intractable, without status epilepticus: Secondary | ICD-10-CM | POA: Insufficient documentation

## 2014-02-15 DIAGNOSIS — S59909A Unspecified injury of unspecified elbow, initial encounter: Secondary | ICD-10-CM | POA: Diagnosis present

## 2014-02-15 MED ORDER — OXYCODONE-ACETAMINOPHEN 5-325 MG PO TABS
1.0000 | ORAL_TABLET | Freq: Once | ORAL | Status: AC
  Administered 2014-02-15: 1 via ORAL
  Filled 2014-02-15: qty 1

## 2014-02-15 MED ORDER — OXYCODONE-ACETAMINOPHEN 5-325 MG PO TABS: 1.0000 | ORAL_TABLET | Freq: Four times a day (QID) | ORAL | Status: DC | PRN

## 2014-02-15 NOTE — ED Notes (Signed)
Ortho at bedside.

## 2014-02-15 NOTE — ED Notes (Signed)
Per EMS, pt from [facility].  Pt there for rehab.  Recently had toes on left foot amputated. Pt fell this morning in bathroom.  Pt states she had her feet tangled up causing fall.  Pt c/o rt elbow pain.  No LOC.  Pt down for 30 min prior to being noticed.  Pt also states struck rt side of head on the head.  No blood thinners.  Arm placed in sling.  Vitals:  150/92, hr 102, resp 22, 97% RA.

## 2014-02-15 NOTE — ED Provider Notes (Signed)
CSN: [id number]     Arrival date & time 02/15/14  1321 History   First MD Initiated Contact with Patient 02/15/14 1427     Chief Complaint  Patient presents with  . Fall  . Elbow Pain     (Consider location/radiation/quality/duration/timing/severity/associated sxs/prior Treatment) HPI  Ms. [name] presents to the ED complaining of right elbow pain. Patient states she was cleaning dishes at the sink, turned to the right and loss her balance. Patient attempted to catch herself and fell on both arms, but patient felt her right arm took most of the impact. Patient states she hit her head on the ground but did not lose consciousness. The pain in her elbow radiates down to her forearm and up to her shoulder. She states her elbow pain is dull and aching. She rates the pain a 10 out of 10. Endorses: headache. Denies: fatigue, shortness of breath, chest pain, abdominal pain, nausea, vomiting, diarrhea, numbness and tingling.   Past Medical History  Diagnosis Date  . Neuropathy   . Glaucoma   . Bipolar 1 disorder   . CHF (congestive heart failure)   . Refusal of blood transfusions as patient is Jehovah's Witness   . Hypertension   . Shortness of breath   . Diabetes mellitus     INSULIN DEPENDENT  . TIA (transient ischemic attack)   . Neuromuscular disorder     DIABETIC NEUROPATHY  . Chronic kidney disease   . Hepatitis C virus   . Seizure     diagnosed 2014  . MRSA infection   . Bipolar disorder, unspecified 02/06/2014  . Schizoaffective disorder, unspecified condition 02/08/2014   Past Surgical History  Procedure Laterality Date  . Abdominal hysterectomy    . Amputation Left 05/21/2013    Procedure: Left Midfoot AMPUTATION;  Surgeon: Newt Minion, MD;  Location: [facility];  Service: Orthopedics;  Laterality: Left;  Left Midfoot Amputation  . Tubal ligation    . Colonoscopy with propofol N/A 06/22/2013    Procedure: COLONOSCOPY WITH PROPOFOL;  Surgeon: Jeryl Columbia, MD;  Location: [facility]  ENDOSCOPY;  Service: Endoscopy;  Laterality: N/A;   Family History  Problem Relation Age of Onset  . Hypertension Mother   . Hypertension Father    History  Substance Use Topics  . Smoking status: Current Every Day Smoker -- 0.25 packs/day for 30 years    Types: Cigarettes  . Smokeless tobacco: Never Used  . Alcohol Use: Yes     Comment: @ 1/week   OB History   Grav Para Term Preterm Abortions TAB SAB Ect Mult Living                 Review of Systems All other systems negative except as documented in the HPI. All pertinent positives and negatives as reviewed in the HPI.    Allergies  Gabapentin; Saphris; Tramadol; and Penicillins  Home Medications   Prior to Admission medications   Medication Sig Start Date End Date Taking? Authorizing Provider  [place], Vaccinium myrtillus, (BILBERRY FRUIT PO) Take 1 capsule by mouth every morning.    Yes Historical Provider, MD  BLACK COHOSH EXTRACT PO Take 2 tablets by mouth every morning.    Yes Historical Provider, MD  carvedilol (COREG) 12.5 MG tablet Take 12.5 mg by mouth 2 (two) times daily.    Yes Historical Provider, MD  cloNIDine (CATAPRES) 0.1 MG tablet Take 0.1 mg by mouth 3 (three) times daily as needed (HTN).    Yes Historical Provider,  MD  DULoxetine (CYMBALTA) 30 MG capsule Take 30 mg by mouth every evening.   Yes Historical Provider, MD  folic acid (FOLVITE) [id number] MCG tablet Take 400 mcg by mouth every morning.   Yes Historical Provider, MD  furosemide (LASIX) 20 MG tablet Take 20 mg by mouth every morning.   Yes Historical Provider, MD  insulin aspart (NOVOLOG) 100 UNIT/ML injection Inject 2-10 Units into the skin 3 (three) times daily before meals. SSI: 0-200 = 2 units, 201-250 = 4 units, 251-300 = 6 units, 301-350 = 8 units, 351-400 = 10 units   Yes Historical Provider, MD  insulin glargine (LANTUS) 100 UNIT/ML injection Inject 55 Units into the skin at bedtime.   Yes Historical Provider, MD  levETIRAcetam (KEPPRA) 500 MG  tablet Take 500 mg by mouth 2 (two) times daily.   Yes Historical Provider, MD  Multiple Vitamins-Minerals (SENIOR MULTIVITAMIN PLUS PO) Take 1 tablet by mouth every morning.   Yes Historical Provider, MD  OLANZapine zydis (ZYPREXA) 5 MG disintegrating tablet Take 5 mg by mouth at bedtime.   Yes Historical Provider, MD  polyethylene glycol (MIRALAX / GLYCOLAX) packet Take 17 g by mouth 2 (two) times daily as needed for mild constipation.   Yes Historical Provider, MD  pregabalin (LYRICA) 75 MG capsule Take 75 mg by mouth 2 (two) times daily.   Yes Historical Provider, MD  simethicone (MYLICON) 80 MG chewable tablet Chew 80 mg by mouth every 8 (eight) hours as needed for flatulence.   Yes Historical Provider, MD   BP 193/97  Pulse 105  Temp(Src) 98.6 F (37 C) (Oral)  Resp 16  SpO2 100% Physical Exam  Nursing note and vitals reviewed. Constitutional: She is oriented to person, place, and time. She appears well-developed and well-nourished.  HENT:  Head: Normocephalic and atraumatic.  Eyes: Pupils are equal, round, and reactive to light.  Cardiovascular:  2+ radial pulse  Pulmonary/Chest: Effort normal.  Musculoskeletal: She exhibits edema.  Limited ROM of R elbow.  Patient has no pain in the wrist or forearm.  The patient has full range of motion of the wrist  Neurological: She is alert and oriented to person, place, and time.  Median, ulnar, radial nerve sensation and motor intact.   Skin: Skin is warm and dry.    ED Course  Procedures (including critical care time) Labs Review Labs Reviewed  CBG MONITORING, ED    Imaging Review Dg Elbow Complete Right  02/15/2014   CLINICAL DATA:  Fall  EXAM: RIGHT ELBOW - COMPLETE 3+ VIEW  COMPARISON:  None.  FINDINGS: There is a minimally displaced transcondylar right distal humeral fracture. There is no dislocation. There is a small joint effusion. The soft tissues are normal.  IMPRESSION: Minimally displaced transcondylar right distal  humeral fracture.   Electronically Signed   By: Kathreen Devoid   On: 02/15/2014 14:11     He should be placed in a splint and referred to orthopedics.  Patient, agrees to the plan and all questions were answered  SPLINT APPLICATION Date/Time: [id number] PM Authorized by: Brent General Consent: Verbal consent obtained. Risks and benefits: risks, benefits and alternatives were discussed Consent given by: patient Splint applied [id number] technician Location details: Right arm/elbow  Splint type: Long arm splint  Supplies used: Fiberglas Ace wrap cotton batting and sling Post-procedure: The splinted body part was neurovascularly unchanged following the procedure. Patient tolerance: Patient tolerated the procedure well with no immediate complications.     Brent General, PA-C  02/15/14 1531 

## 2014-02-15 NOTE — ED Notes (Signed)
Patient contacted her daughter for discharge ride home.

## 2014-02-15 NOTE — Discharge Instructions (Signed)
Followup with the orthopedist provided.  Return here as needed.  Apply ice to the area

## 2014-02-15 NOTE — ED Notes (Signed)
Pt story slightly different than initial EMS report.  Pt states she had her toes amputated a while back.  Pt was in rehab b/c she has been losing her balance.  States she lost her balance again today causing fall.  Was recently d/c with same.

## 2014-02-15 NOTE — ED Notes (Signed)
PA at bedside to note bp

## 2014-02-16 LAB — CBG MONITORING, ED: Glucose-Capillary: 274 mg/dL — ABNORMAL HIGH (ref 70–99)

## 2014-02-16 NOTE — ED Provider Notes (Signed)
Medical screening examination/treatment/procedure(s) were performed by non-physician practitioner and as supervising physician I was immediately available for consultation/collaboration.   EKG Interpretation None       [name]. Alvino Chapel, MD 02/16/14 [phone]

## 2014-02-20 ENCOUNTER — Emergency Department (HOSPITAL_COMMUNITY)
Admission: EM | Admit: 2014-02-20 | Discharge: 2014-02-21 | Disposition: A | Discharge status: Home / Self Care | Payer: Medicare Other | Attending: Emergency Medicine | Admitting: Emergency Medicine

## 2014-02-20 ENCOUNTER — Encounter (HOSPITAL_COMMUNITY): Payer: Self-pay | Admitting: Emergency Medicine

## 2014-02-20 DIAGNOSIS — S91309A Unspecified open wound, unspecified foot, initial encounter: Secondary | ICD-10-CM | POA: Diagnosis not present

## 2014-02-20 DIAGNOSIS — Z79899 Other long term (current) drug therapy: Secondary | ICD-10-CM | POA: Insufficient documentation

## 2014-02-20 DIAGNOSIS — K5900 Constipation, unspecified: Secondary | ICD-10-CM | POA: Insufficient documentation

## 2014-02-20 DIAGNOSIS — Z8614 Personal history of Methicillin resistant Staphylococcus aureus infection: Secondary | ICD-10-CM | POA: Insufficient documentation

## 2014-02-20 DIAGNOSIS — S98919A Complete traumatic amputation of unspecified foot, level unspecified, initial encounter: Secondary | ICD-10-CM | POA: Insufficient documentation

## 2014-02-20 DIAGNOSIS — Z8669 Personal history of other diseases of the nervous system and sense organs: Secondary | ICD-10-CM | POA: Diagnosis not present

## 2014-02-20 DIAGNOSIS — F319 Bipolar disorder, unspecified: Secondary | ICD-10-CM | POA: Insufficient documentation

## 2014-02-20 DIAGNOSIS — G40909 Epilepsy, unspecified, not intractable, without status epilepticus: Secondary | ICD-10-CM | POA: Insufficient documentation

## 2014-02-20 DIAGNOSIS — N189 Chronic kidney disease, unspecified: Secondary | ICD-10-CM | POA: Insufficient documentation

## 2014-02-20 DIAGNOSIS — E8809 Other disorders of plasma-protein metabolism, not elsewhere classified: Secondary | ICD-10-CM | POA: Diagnosis not present

## 2014-02-20 DIAGNOSIS — Z8673 Personal history of transient ischemic attack (TIA), and cerebral infarction without residual deficits: Secondary | ICD-10-CM | POA: Diagnosis not present

## 2014-02-20 DIAGNOSIS — S99919A Unspecified injury of unspecified ankle, initial encounter: Secondary | ICD-10-CM | POA: Diagnosis present

## 2014-02-20 DIAGNOSIS — Y939 Activity, unspecified: Secondary | ICD-10-CM | POA: Diagnosis not present

## 2014-02-20 DIAGNOSIS — I129 Hypertensive chronic kidney disease with stage 1 through stage 4 chronic kidney disease, or unspecified chronic kidney disease: Secondary | ICD-10-CM | POA: Diagnosis not present

## 2014-02-20 DIAGNOSIS — F17200 Nicotine dependence, unspecified, uncomplicated: Secondary | ICD-10-CM | POA: Insufficient documentation

## 2014-02-20 DIAGNOSIS — Z794 Long term (current) use of insulin: Secondary | ICD-10-CM | POA: Diagnosis not present

## 2014-02-20 DIAGNOSIS — W1809XA Striking against other object with subsequent fall, initial encounter: Secondary | ICD-10-CM | POA: Diagnosis not present

## 2014-02-20 DIAGNOSIS — R609 Edema, unspecified: Secondary | ICD-10-CM | POA: Insufficient documentation

## 2014-02-20 DIAGNOSIS — E1142 Type 2 diabetes mellitus with diabetic polyneuropathy: Secondary | ICD-10-CM | POA: Diagnosis not present

## 2014-02-20 DIAGNOSIS — S91302A Unspecified open wound, left foot, initial encounter: Secondary | ICD-10-CM

## 2014-02-20 DIAGNOSIS — Z880 Allergy status to penicillin: Secondary | ICD-10-CM | POA: Insufficient documentation

## 2014-02-20 DIAGNOSIS — R600 Localized edema: Secondary | ICD-10-CM

## 2014-02-20 DIAGNOSIS — R51 Headache: Secondary | ICD-10-CM | POA: Insufficient documentation

## 2014-02-20 DIAGNOSIS — R141 Gas pain: Secondary | ICD-10-CM | POA: Diagnosis not present

## 2014-02-20 DIAGNOSIS — R6883 Chills (without fever): Secondary | ICD-10-CM | POA: Diagnosis not present

## 2014-02-20 DIAGNOSIS — W19XXXA Unspecified fall, initial encounter: Secondary | ICD-10-CM

## 2014-02-20 DIAGNOSIS — Y929 Unspecified place or not applicable: Secondary | ICD-10-CM | POA: Insufficient documentation

## 2014-02-20 DIAGNOSIS — R143 Flatulence: Secondary | ICD-10-CM

## 2014-02-20 DIAGNOSIS — I509 Heart failure, unspecified: Secondary | ICD-10-CM | POA: Diagnosis not present

## 2014-02-20 DIAGNOSIS — E1149 Type 2 diabetes mellitus with other diabetic neurological complication: Secondary | ICD-10-CM | POA: Diagnosis not present

## 2014-02-20 DIAGNOSIS — S8990XA Unspecified injury of unspecified lower leg, initial encounter: Secondary | ICD-10-CM | POA: Diagnosis present

## 2014-02-20 DIAGNOSIS — R142 Eructation: Secondary | ICD-10-CM

## 2014-02-20 NOTE — ED Notes (Signed)
The pt was just discharged 4 days ago and since she was taking the lovenox shots she has had abd swelling and bi-lateral leg swelling..  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  She has lesions and painwith swelling to the lt lower extremity

## 2014-02-20 NOTE — ED Notes (Signed)
The pt wants her rt arm  Splint checked also

## 2014-02-21 ENCOUNTER — Emergency Department (HOSPITAL_COMMUNITY): Payer: Medicare Other

## 2014-02-21 ENCOUNTER — Encounter (HOSPITAL_COMMUNITY): Payer: Self-pay

## 2014-02-21 DIAGNOSIS — S91309A Unspecified open wound, unspecified foot, initial encounter: Secondary | ICD-10-CM | POA: Diagnosis not present

## 2014-02-21 LAB — COMPREHENSIVE METABOLIC PANEL
ALBUMIN: 1.8 g/dL — AB (ref 3.5–5.2)
ALK PHOS: 119 U/L — AB (ref 39–117)
ALT: 58 U/L — ABNORMAL HIGH (ref 0–35)
AST: 86 U/L — ABNORMAL HIGH (ref 0–37)
Anion gap: 13 (ref 5–15)
BUN: 31 mg/dL — ABNORMAL HIGH (ref 6–23)
CHLORIDE: 98 meq/L (ref 96–112)
CO2: 27 mEq/L (ref 19–32)
Calcium: 7.7 mg/dL — ABNORMAL LOW (ref 8.4–10.5)
Creatinine, Ser: 2.2 mg/dL — ABNORMAL HIGH (ref 0.50–1.10)
GFR calc Af Amer: 29 mL/min — ABNORMAL LOW (ref >90)
GFR calc non Af Amer: 25 mL/min — ABNORMAL LOW (ref >90)
Glucose, Bld: 232 mg/dL — ABNORMAL HIGH (ref 70–99)
POTASSIUM: 4.3 meq/L (ref 3.7–5.3)
Sodium: 138 mEq/L (ref 137–147)
Total Bilirubin: <0.2 mg/dL — ABNORMAL LOW (ref 0.3–1.2)
Total Protein: 6.1 g/dL (ref 6.0–8.3)

## 2014-02-21 LAB — CBC WITH DIFFERENTIAL/PLATELET
BASOS ABS: 0 10*3/uL (ref 0.0–0.1)
BASOS PCT: 0 % (ref 0–1)
Eosinophils Absolute: 0.1 10*3/uL (ref 0.0–0.7)
Eosinophils Relative: 3 % (ref 0–5)
HCT: 23.5 % — ABNORMAL LOW (ref 36.0–46.0)
HEMOGLOBIN: 7.6 g/dL — AB (ref 12.0–15.0)
LYMPHS PCT: 35 % (ref 12–46)
Lymphs Abs: 1.3 10*3/uL (ref 0.7–4.0)
MCH: 28 pg (ref 26.0–34.0)
MCHC: 32.3 g/dL (ref 30.0–36.0)
MCV: 86.7 fL (ref 78.0–100.0)
MONOS PCT: 8 % (ref 3–12)
Monocytes Absolute: 0.3 10*3/uL (ref 0.1–1.0)
NEUTROS ABS: 1.9 10*3/uL (ref 1.7–7.7)
NEUTROS PCT: 54 % (ref 43–77)
Platelets: 221 10*3/uL (ref 150–400)
RBC: 2.71 MIL/uL — ABNORMAL LOW (ref 3.87–5.11)
RDW: 14.8 % (ref 11.5–15.5)
WBC: 3.6 10*3/uL — AB (ref 4.0–10.5)

## 2014-02-21 LAB — CBG MONITORING, ED: GLUCOSE-CAPILLARY: 207 mg/dL — AB (ref 70–99)

## 2014-02-21 LAB — PRO B NATRIURETIC PEPTIDE: Pro B Natriuretic peptide (BNP): 930.6 pg/mL — ABNORMAL HIGH (ref 0–125)

## 2014-02-21 MED ORDER — ENOXAPARIN SODIUM 80 MG/0.8ML ~~LOC~~ SOLN
80.0000 mg | Freq: Once | SUBCUTANEOUS | Status: AC
  Administered 2014-02-21: 80 mg via SUBCUTANEOUS
  Filled 2014-02-21: qty 0.8

## 2014-02-21 NOTE — Discharge Instructions (Signed)
Return for a DVT/ultrasound study in the morning as stated on your prescription. Followup with a orthopedic specialist for further evaluation of your arm injury. Call for a follow up appointment with a Family or Primary Care Provider for further evaluation of your low albumin and repeat liver function studies. Return if Symptoms worsen.   Take medication as prescribed.  Keep your left foot wound clean and dry, changed the dressing twice a day.

## 2014-02-21 NOTE — ED Provider Notes (Signed)
Medical screening examination/treatment/procedure(s) were conducted as a shared visit with non-physician practitioner(s) and myself.  I personally evaluated the patient during the encounter. This chronically ill diabetic patient with multiple ongoing medical problems is s/p right transmetatarsal amputation several weeks ago. She came in with several complaints. Most notably, she is concerned about LE edema. She had a recent duplex U/S of the rle which was negative for DVT. I have evaluated her wound and it does not appear to be acutely infected. I agree with MLP plan.    Elyn Peers, MD 03/10/14 [phone]

## 2014-02-21 NOTE — ED Provider Notes (Signed)
CSN: [id number]     Arrival date & time 02/20/14  2330 History   First MD Initiated Contact with Patient 02/20/14 2342     Chief Complaint  Patient presents with  . 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  The patient reports increase in heaviness to bilateral legs with ambulation. The patient reports compliance with Lasix. She reports abdominal distention since receiving Lovenox and subcutaneous insulin hospitalized. She reports she fell and hit her left foot today.  The patient reports persistent shoulder and wrist pain since her fall on 7/17. She reports her splint Ace wrap became loose after getting caught while sleeping. PCP: [name], MD  The history is provided by the patient. No language interpreter was used.    Past Medical History  Diagnosis Date  . Neuropathy   . Glaucoma   . Bipolar 1 disorder   . CHF (congestive heart failure)   . Refusal of blood transfusions as patient is Jehovah's Witness   . Hypertension   . Shortness of breath   . Diabetes mellitus     INSULIN DEPENDENT  . TIA (transient ischemic attack)   . Neuromuscular disorder     DIABETIC NEUROPATHY  . Chronic kidney disease   . Hepatitis C virus   . Seizure     diagnosed 2014  . MRSA infection   . Bipolar disorder, unspecified 02/06/2014  . Schizoaffective disorder, unspecified condition 02/08/2014   Past Surgical History  Procedure Laterality Date  . Abdominal hysterectomy    . Amputation Left 05/21/2013    Procedure: Left Midfoot AMPUTATION;  Surgeon: Newt Minion, MD;  Location: [place];  Service: Orthopedics;  Laterality: Left;   Left Midfoot Amputation  . Tubal ligation    . Colonoscopy with propofol N/A 06/22/2013    Procedure: COLONOSCOPY WITH PROPOFOL;  Surgeon: Jeryl Columbia, MD;  Location: [facility];  Service: Endoscopy;  Laterality: N/A;   Family History  Problem Relation Age of Onset  . Hypertension Mother   . Hypertension Father    History  Substance Use Topics  . Smoking status: Current Every Day Smoker -- 0.25 packs/day for 30 years    Types: Cigarettes  . Smokeless tobacco: Never Used  . Alcohol Use: Yes     Comment: @ 1/week   OB History   Grav Para Term Preterm Abortions TAB SAB Ect Mult Living                 Review of Systems  Constitutional: Positive for chills. Negative for fever.  Cardiovascular: Positive for leg swelling.  Gastrointestinal: Positive for constipation and abdominal distention. Negative for nausea, vomiting, abdominal pain, diarrhea and blood in stool.  Musculoskeletal: Positive for arthralgias.  Skin: Positive for wound.  Neurological: Positive for headaches.      Allergies  Gabapentin; Saphris; Tramadol; and Penicillins  Home Medications   Prior to Admission medications   Medication Sig Start Date End Date Taking? Authorizing Provider  Bilberry, Vaccinium myrtillus, (BILBERRY FRUIT PO) Take 1 capsule by mouth every morning.    Yes Historical Provider, MD  BLACK COHOSH EXTRACT PO Take 2 tablets by mouth every morning.    Yes Historical Provider, MD  carvedilol (COREG) 12.5 MG  tablet Take 12.5 mg by mouth 2 (two) times daily.    Yes Historical Provider, MD  cloNIDine (CATAPRES) 0.1 MG tablet Take 0.1 mg by mouth daily.   Yes Historical Provider, MD  DULoxetine (CYMBALTA) 30 MG capsule Take 30 mg by mouth every evening.   Yes Historical Provider, MD  folic acid (FOLVITE) A999333 MCG tablet Take 400 mcg by mouth every morning.   Yes Historical Provider, MD  furosemide (LASIX) 20 MG tablet Take 20 mg by mouth every morning.   Yes Historical Provider, MD  insulin  aspart (NOVOLOG) 100 UNIT/ML injection Inject 2-10 Units into the skin 3 (three) times daily before meals. SSI: 0-200 = 2 units, 201-250 = 4 units, 251-300 = 6 units, 301-350 = 8 units, 351-400 = 10 units   Yes Historical Provider, MD  insulin glargine (LANTUS) 100 UNIT/ML injection Inject 55 Units into the skin at bedtime.   Yes Historical Provider, MD  levETIRAcetam (KEPPRA) 500 MG tablet Take 500 mg by mouth 2 (two) times daily.   Yes Historical Provider, MD  Multiple Vitamins-Minerals (SENIOR MULTIVITAMIN PLUS PO) Take 1 tablet by mouth every morning.   Yes Historical Provider, MD  oxyCODONE-acetaminophen (PERCOCET/ROXICET) 5-325 MG per tablet Take 1 tablet by mouth every 6 (six) hours as needed for severe pain. 02/15/14  Yes Resa Miner Lawyer, PA-C  polyethylene glycol (MIRALAX / GLYCOLAX) packet Take 17 g by mouth 2 (two) times daily.   Yes Historical Provider, MD  pregabalin (LYRICA) 75 MG capsule Take 75 mg by mouth 2 (two) times daily.   Yes Historical Provider, MD  simethicone (MYLICON) 80 MG chewable tablet Chew 80 mg by mouth every 8 (eight) hours as needed for flatulence.   Yes Historical Provider, MD   BP 186/96  Pulse 98  Temp(Src) 97.8 F (36.6 C) (Oral)  Resp 18  SpO2 98% Physical Exam  Nursing note and vitals reviewed. Constitutional: She is oriented to person, place, and time. She appears well-developed and well-nourished.  Non-toxic appearance. She does not have a sickly appearance. She does not appear ill. No distress.  HENT:  Head: Normocephalic and atraumatic.  Eyes: No scleral icterus.  Neck: Neck supple.  Cardiovascular: Normal rate and regular rhythm.   Bilateral lower extremity edema, left greater than right. Bilateral upper extremity edema.  Pulmonary/Chest: Effort normal and breath sounds normal. No respiratory distress. She has no wheezes. She has no rales.  Abdominal: Soft. She exhibits no mass. There is no tenderness. There is no rebound and no guarding.   Injection sites without evidence of abscess or drainage no surrounding erythema.  Musculoskeletal:  Right arm in posterior splint. Good grip strength, good cap refill, hand and fingers soft, no increase in swelling. Left foot: Status post amputation. Small open wound with out bleeding from the previous surgical site. Malodorous no obvious discharge. Medial posterior and medial thighs erythremic.   Neurological: She is alert and oriented to person, place, and time.  Skin: Skin is warm and dry. She is not diaphoretic.  Psychiatric: She has a normal mood and affect. Her behavior is normal.    ED Course  Procedures (including critical care time) Labs Review Labs Reviewed  CBC WITH DIFFERENTIAL - Abnormal; Notable for the following:    WBC 3.6 (*)    RBC 2.71 (*)    Hemoglobin 7.6 (*)    HCT 23.5 (*)    All other components within normal limits  COMPREHENSIVE METABOLIC PANEL - Abnormal; Notable for the following:  Glucose, Bld 232 (*)    BUN 31 (*)    Creatinine, Ser 2.20 (*)    Calcium 7.7 (*)    Albumin 1.8 (*)    AST 86 (*)    ALT 58 (*)    Alkaline Phosphatase 119 (*)    Total Bilirubin <0.2 (*)    GFR calc non Af Amer 25 (*)    GFR calc Af Amer 29 (*)    All other components within normal limits  PRO B NATRIURETIC PEPTIDE - Abnormal; Notable for the following:    Pro B Natriuretic peptide (BNP) 930.6 (*)    All other components within normal limits  CBG MONITORING, ED - Abnormal; Notable for the following:    Glucose-Capillary 207 (*)    All other components within normal limits    Imaging Review Dg Chest 2 View  02/21/2014   CLINICAL DATA:  shortness of breath  EXAM: CHEST  2 VIEW  COMPARISON:  Prior radiograph from 02/02/2014  FINDINGS: The cardiac and mediastinal silhouettes are stable in size and contour, and remain within normal limits.  The lungs are normally inflated. Scattered opacities within the medial right lung base on frontal projection are favored to  reflect the normal bronchovascular markings. No airspace consolidation, pleural effusion, or pulmonary edema is identified. There is no pneumothorax.  No acute osseous abnormality identified.  IMPRESSION: No active cardiopulmonary disease.   Electronically Signed   By: Jeannine Boga M.D.   On: 02/21/2014 01:15   Dg Shoulder Right  02/21/2014   CLINICAL DATA:  Shoulder pain.  No acute injury.  EXAM: RIGHT SHOULDER - 2+ VIEW  COMPARISON:  None.  FINDINGS: The mineralization and alignment are normal. There is no evidence of acute fracture or dislocation. The subacromial space is preserved.  IMPRESSION: No acute osseous findings.   Electronically Signed   By: Camie Patience M.D.   On: 02/21/2014 01:12   Dg Wrist Complete Right  02/21/2014   CLINICAL DATA:  Right wrist pain.  EXAM: RIGHT WRIST - COMPLETE 3+ VIEW  COMPARISON:  None.  FINDINGS: There is no evidence of fracture or dislocation. The carpal rows are intact, and demonstrate normal alignment. The joint spaces are preserved.  Evaluation of the soft tissues is mildly suboptimal due to an overlying splint.  IMPRESSION: No evidence of fracture or dislocation.   Electronically Signed   By: Garald Balding M.D.   On: 02/21/2014 01:38   Dg Foot Complete Left  02/21/2014   CLINICAL DATA:  Foot pain. History of diabetes and partial amputation.  EXAM: LEFT FOOT - COMPLETE 3+ VIEW  COMPARISON:  06/17/2013 radiographs.  FINDINGS: Patient has undergone previous amputation through the bases of the metatarsals. There is no progressive destruction of the metatarsal bases. The bones of the midfoot and hindfoot are intact. No radiopaque foreign body, soft tissue emphysema or acute osseous findings demonstrated.  IMPRESSION: Postsurgical changes from prior amputation through the metatarsal bases. No radiographic evidence of recurrent osteomyelitis.   Electronically Signed   By: Camie Patience M.D.   On: 02/21/2014 01:14     EKG Interpretation None      MDM    Final diagnoses:  Edema of left lower extremity  Open wound of left foot, initial encounter  Hypoalbuminemia  Fall, initial encounter   Patient presents with multiple complaints on exam she is moderately edematous throughout her lower extremities and upper extremities. Left foot status post amputation with open wound can concerning for infection. Bilateral  legs with erythema concerning for cellulitis. Right arm without signs of compartment syndrome. Labs, x-rays ordered. Baseline Cr 1.8, per previous notes. No vascular congestion noted on chest x-ray. Albumin 1.8, generalized edema likely do to low albumin levels. Hemoglobin 7.6, slight down trending form 2 weeks ago, history of anemia. Left foot x-ray without signs of osteo, right shoulder and right wrist without acute findings. Dr. Cheri Guppy also evaluated the patient during this encounter.  Advises Lovenox an outpatient ultrasound to evaluate for increased left lower extremity swelling. Bilateral lower extremity erythema likely from chafing of the inner thighs. Right arm splint is rewrapped and new sling applied.   Discussed lab results, imaging results, and treatment plan with the patient. Advised repeat blood work with her PCP, encouraged good oral intake. Return precautions given. Reports understanding and no other concerns at this time.  Patient is stable for discharge at this time.  Meds given in ED:  Medications  enoxaparin (LOVENOX) injection 80 mg (80 mg Subcutaneous Given 02/21/14 0308)    Discharge Medication List as of 02/21/2014  2:53 AM           [place], PA-C 02/22/14 [phone]

## 2014-02-22 ENCOUNTER — Emergency Department (HOSPITAL_COMMUNITY): Payer: Medicare Other

## 2014-02-22 ENCOUNTER — Encounter (HOSPITAL_COMMUNITY): Payer: Self-pay | Admitting: Emergency Medicine

## 2014-02-22 DIAGNOSIS — Z79899 Other long term (current) drug therapy: Secondary | ICD-10-CM | POA: Insufficient documentation

## 2014-02-22 DIAGNOSIS — Z8659 Personal history of other mental and behavioral disorders: Secondary | ICD-10-CM | POA: Diagnosis not present

## 2014-02-22 DIAGNOSIS — M7989 Other specified soft tissue disorders: Secondary | ICD-10-CM | POA: Diagnosis present

## 2014-02-22 DIAGNOSIS — Z8614 Personal history of Methicillin resistant Staphylococcus aureus infection: Secondary | ICD-10-CM | POA: Insufficient documentation

## 2014-02-22 DIAGNOSIS — D649 Anemia, unspecified: Secondary | ICD-10-CM | POA: Diagnosis not present

## 2014-02-22 DIAGNOSIS — Z794 Long term (current) use of insulin: Secondary | ICD-10-CM | POA: Diagnosis not present

## 2014-02-22 DIAGNOSIS — N189 Chronic kidney disease, unspecified: Secondary | ICD-10-CM | POA: Diagnosis not present

## 2014-02-22 DIAGNOSIS — I129 Hypertensive chronic kidney disease with stage 1 through stage 4 chronic kidney disease, or unspecified chronic kidney disease: Secondary | ICD-10-CM | POA: Diagnosis not present

## 2014-02-22 DIAGNOSIS — R609 Edema, unspecified: Secondary | ICD-10-CM | POA: Insufficient documentation

## 2014-02-22 DIAGNOSIS — Z880 Allergy status to penicillin: Secondary | ICD-10-CM | POA: Insufficient documentation

## 2014-02-22 DIAGNOSIS — I509 Heart failure, unspecified: Secondary | ICD-10-CM | POA: Insufficient documentation

## 2014-02-22 DIAGNOSIS — I428 Other cardiomyopathies: Secondary | ICD-10-CM | POA: Diagnosis not present

## 2014-02-22 DIAGNOSIS — G589 Mononeuropathy, unspecified: Secondary | ICD-10-CM | POA: Diagnosis not present

## 2014-02-22 DIAGNOSIS — F17200 Nicotine dependence, unspecified, uncomplicated: Secondary | ICD-10-CM | POA: Diagnosis not present

## 2014-02-22 DIAGNOSIS — F319 Bipolar disorder, unspecified: Secondary | ICD-10-CM | POA: Insufficient documentation

## 2014-02-22 DIAGNOSIS — E119 Type 2 diabetes mellitus without complications: Secondary | ICD-10-CM | POA: Insufficient documentation

## 2014-02-22 NOTE — ED Notes (Addendum)
Pt. reports persistent edema at lower extremities / abdomen and exertional dyspnea for several days , seen here 2 days ago for the same complaints - discharged home , denies fever or chills. Blood tests and x-rays done on her recent visit .

## 2014-02-23 ENCOUNTER — Emergency Department (HOSPITAL_COMMUNITY)
Admission: EM | Admit: 2014-02-23 | Discharge: 2014-02-23 | Disposition: A | Discharge status: Home / Self Care | Payer: Medicare Other | Attending: Emergency Medicine | Admitting: Emergency Medicine

## 2014-02-23 DIAGNOSIS — I429 Cardiomyopathy, unspecified: Secondary | ICD-10-CM

## 2014-02-23 DIAGNOSIS — I428 Other cardiomyopathies: Secondary | ICD-10-CM | POA: Diagnosis not present

## 2014-02-23 DIAGNOSIS — R609 Edema, unspecified: Secondary | ICD-10-CM

## 2014-02-23 DIAGNOSIS — D649 Anemia, unspecified: Secondary | ICD-10-CM

## 2014-02-23 LAB — COMPREHENSIVE METABOLIC PANEL
ALT: 52 U/L — ABNORMAL HIGH (ref 0–35)
AST: 60 U/L — AB (ref 0–37)
Albumin: 1.7 g/dL — ABNORMAL LOW (ref 3.5–5.2)
Alkaline Phosphatase: 128 U/L — ABNORMAL HIGH (ref 39–117)
Anion gap: 10 (ref 5–15)
BUN: 28 mg/dL — ABNORMAL HIGH (ref 6–23)
CO2: 27 mEq/L (ref 19–32)
CREATININE: 2.01 mg/dL — AB (ref 0.50–1.10)
Calcium: 7.7 mg/dL — ABNORMAL LOW (ref 8.4–10.5)
Chloride: 96 mEq/L (ref 96–112)
GFR calc non Af Amer: 28 mL/min — ABNORMAL LOW (ref >90)
GFR, EST AFRICAN AMERICAN: 33 mL/min — AB (ref >90)
Glucose, Bld: 281 mg/dL — ABNORMAL HIGH (ref 70–99)
Potassium: 4.3 mEq/L (ref 3.7–5.3)
Sodium: 133 mEq/L — ABNORMAL LOW (ref 137–147)
Total Bilirubin: <0.2 mg/dL — ABNORMAL LOW (ref 0.3–1.2)
Total Protein: 6.4 g/dL (ref 6.0–8.3)

## 2014-02-23 LAB — CBC WITH DIFFERENTIAL/PLATELET
BASOS ABS: 0 10*3/uL (ref 0.0–0.1)
Basophils Relative: 0 % (ref 0–1)
Eosinophils Absolute: 0.1 10*3/uL (ref 0.0–0.7)
Eosinophils Relative: 3 % (ref 0–5)
HEMATOCRIT: 23.9 % — AB (ref 36.0–46.0)
Hemoglobin: 7.6 g/dL — ABNORMAL LOW (ref 12.0–15.0)
LYMPHS PCT: 24 % (ref 12–46)
Lymphs Abs: 1.1 10*3/uL (ref 0.7–4.0)
MCH: 27.6 pg (ref 26.0–34.0)
MCHC: 31.8 g/dL (ref 30.0–36.0)
MCV: 86.9 fL (ref 78.0–100.0)
Monocytes Absolute: 0.4 10*3/uL (ref 0.1–1.0)
Monocytes Relative: 8 % (ref 3–12)
Neutro Abs: 3 10*3/uL (ref 1.7–7.7)
Neutrophils Relative %: 65 % (ref 43–77)
Platelets: 249 10*3/uL (ref 150–400)
RBC: 2.75 MIL/uL — ABNORMAL LOW (ref 3.87–5.11)
RDW: 14.7 % (ref 11.5–15.5)
WBC: 4.6 10*3/uL (ref 4.0–10.5)

## 2014-02-23 LAB — PRO B NATRIURETIC PEPTIDE: Pro B Natriuretic peptide (BNP): 2305 pg/mL — ABNORMAL HIGH (ref 0–125)

## 2014-02-23 MED ORDER — FUROSEMIDE 10 MG/ML IJ SOLN
40.0000 mg | Freq: Once | INTRAMUSCULAR | Status: AC
  Administered 2014-02-23: 40 mg via INTRAVENOUS
  Filled 2014-02-23: qty 4

## 2014-02-23 NOTE — Discharge Instructions (Signed)
Use support hose and elevation to help with the swelling in your legs.   Follow up with your primary care doctor to discuss changes to your medications.   Come back to the ED for worsening symptoms or fever.   Eat a low sodium diet.   Your red blood cell count is low (your are anemic).  Follow up with your doctor for further investigation into the cause of low red blood cell count.

## 2014-02-23 NOTE — ED Notes (Signed)
MD at bedside. 

## 2014-02-23 NOTE — ED Provider Notes (Signed)
CSN: [id number]     Arrival date & time 02/22/14  2308 History   First MD Initiated Contact with Patient 02/23/14 0217     Chief Complaint  Patient presents with  . Leg Swelling     (Consider location/radiation/quality/duration/timing/severity/associated sxs/prior Treatment) HPI This is a 48 year old woman with multiple chronic medical problems including orally controlled diabetes, schizoaffective disorder, chronic kidney disease, hypertension and cardiomyopathy.  She is here for the fourth time in the last 7 days with complaints of recently severe lower extremity edema. Today, she comes in with the additional complaint of shortness of breath with even minimal exertion. She says she is unable to put her shoes on because her legs are so swollen. She also says her abdomen is swollen. She is endorses orthopnea. She is not short of breath at rest. She has not had a cough or fever. No chest pain.  The patient states that she has been compliant with Lasix 40 mg. She says she takes this whenever she looks at her legs and notes that they are swollen.  Past Medical History  Diagnosis Date  . Neuropathy   . Glaucoma   . Bipolar 1 disorder   . CHF (congestive heart failure)   . Refusal of blood transfusions as patient is Jehovah's Witness   . Hypertension   . Shortness of breath   . Diabetes mellitus     INSULIN DEPENDENT  . TIA (transient ischemic attack)   . Neuromuscular disorder     DIABETIC NEUROPATHY  . Chronic kidney disease   . Hepatitis C virus   . Seizure     diagnosed 2014  . MRSA infection   . Bipolar disorder, unspecified 02/06/2014  . Schizoaffective disorder, unspecified condition 02/08/2014   Past Surgical History  Procedure Laterality Date  . Abdominal hysterectomy    . Amputation Left 05/21/2013    Procedure: Left Midfoot AMPUTATION;  Surgeon: Newt Minion, MD;  Location: [place];  Service: Orthopedics;  Laterality: Left;  Left Midfoot Amputation  . Tubal ligation    .  Colonoscopy with propofol N/A 06/22/2013    Procedure: COLONOSCOPY WITH PROPOFOL;  Surgeon: Jeryl Columbia, MD;  Location: [facility];  Service: Endoscopy;  Laterality: N/A;   Family History  Problem Relation Age of Onset  . Hypertension Mother   . Hypertension Father    History  Substance Use Topics  . Smoking status: Current Every Day Smoker -- 0.25 packs/day for 30 years    Types: Cigarettes  . Smokeless tobacco: Never Used  . Alcohol Use: Yes     Comment: @ 1/week   OB History   Grav Para Term Preterm Abortions TAB SAB Ect Mult Living                 Review of Systems   Ten point review of symptoms performed and is negative with the exception of symptoms noted above.  Allergies  Gabapentin; Saphris; Tramadol; and Penicillins  Home Medications   Prior to Admission medications   Medication Sig Start Date End Date Taking? Authorizing Provider  Bilberry, Vaccinium myrtillus, (BILBERRY FRUIT PO) Take 1 capsule by mouth every morning.    Yes Historical Provider, MD  BLACK COHOSH EXTRACT PO Take 2 tablets by mouth every morning.    Yes Historical Provider, MD  carvedilol (COREG) 12.5 MG tablet Take 12.5 mg by mouth 2 (two) times daily.    Yes Historical Provider, MD  cloNIDine (CATAPRES) 0.1 MG tablet Take 0.1 mg by mouth  daily.   Yes Historical Provider, MD  DULoxetine (CYMBALTA) 30 MG capsule Take 30 mg by mouth every evening.   Yes Historical Provider, MD  folic acid (FOLVITE) A999333 MCG tablet Take 400 mcg by mouth every morning.   Yes Historical Provider, MD  furosemide (LASIX) 20 MG tablet Take 20 mg by mouth every morning.   Yes Historical Provider, MD  insulin aspart (NOVOLOG) 100 UNIT/ML injection Inject 2-10 Units into the skin 3 (three) times daily before meals. SSI: 0-200 = 2 units, 201-250 = 4 units, 251-300 = 6 units, 301-350 = 8 units, 351-400 = 10 units   Yes Historical Provider, MD  insulin glargine (LANTUS) 100 UNIT/ML injection Inject 55 Units into the skin at  bedtime.   Yes Historical Provider, MD  levETIRAcetam (KEPPRA) 500 MG tablet Take 500 mg by mouth 2 (two) times daily.   Yes Historical Provider, MD  Multiple Vitamins-Minerals (SENIOR MULTIVITAMIN PLUS PO) Take 1 tablet by mouth every morning.   Yes Historical Provider, MD  oxyCODONE-acetaminophen (PERCOCET/ROXICET) 5-325 MG per tablet Take 1 tablet by mouth every 6 (six) hours as needed for severe pain. 02/15/14  Yes Resa Miner Lawyer, PA-C  polyethylene glycol (MIRALAX / GLYCOLAX) packet Take 17 g by mouth 2 (two) times daily.   Yes Historical Provider, MD  pregabalin (LYRICA) 75 MG capsule Take 75 mg by mouth 2 (two) times daily.   Yes Historical Provider, MD  simethicone (MYLICON) 80 MG chewable tablet Chew 80 mg by mouth every 8 (eight) hours as needed for flatulence.   Yes Historical Provider, MD   BP 189/89  Pulse 96  Temp(Src) 98.6 F (37 C)  Resp 20  SpO2 94% Physical Exam Gen: well developed and well nourished appearing Head: NCAT Eyes: PERL, EOMI Nose: no epistaixis or rhinorrhea Mouth/throat: mucosa is moist and pink Neck: supple, no stridor Lungs: CTA B, no wheezing, rhonchi or rales CV: RRR, no murmur, extremities appear well perfused.  Abd: soft, notender, nondistended Back: no ttp, no cva ttp Skin: warm and dry Ext: right arm in slight, s/p left transray metatarsal amp - wound without signs of infection, symmetric and pitting pretibial edema bilaterally.  Neuro: CN ii-xii grossly intact, no focal deficits Psyche; normal affect,  calm and cooperative.   ED Course  Procedures (including critical care time) Labs Review Labs Reviewed  CBC WITH DIFFERENTIAL - Abnormal; Notable for the following:    RBC 2.75 (*)    Hemoglobin 7.6 (*)    HCT 23.9 (*)    All other components within normal limits  COMPREHENSIVE METABOLIC PANEL - Abnormal; Notable for the following:    Sodium 133 (*)    Glucose, Bld 281 (*)    BUN 28 (*)    Creatinine, Ser 2.01 (*)    Calcium 7.7  (*)    Albumin 1.7 (*)    AST 60 (*)    ALT 52 (*)    Alkaline Phosphatase 128 (*)    Total Bilirubin <0.2 (*)    GFR calc non Af Amer 28 (*)    GFR calc Af Amer 33 (*)    All other components within normal limits  PRO B NATRIURETIC PEPTIDE - Abnormal; Notable for the following:    Pro B Natriuretic peptide (BNP) 2305.0 (*)    All other components within normal limits  COMPREHENSIVE METABOLIC PANEL  CBC WITH DIFFERENTIAL  PRO B NATRIURETIC PEPTIDE  OCCULT BLOOD X 1 CARD TO LAB, STOOL    Imaging Review Dg  Chest 2 View  02/22/2014   CLINICAL DATA:  Leg swelling and shortness of breath.  EXAM: CHEST  2 VIEW  COMPARISON:  Chest radiograph performed 02/21/2014  FINDINGS: The lungs are well-aerated. Vascular congestion is noted, with mildly increased interstitial markings, raising concern for mild pulmonary edema. No pleural effusion or pneumothorax is seen.  The heart is borderline normal in size; the mediastinal contour is within normal limits. No acute osseous abnormalities are seen.  IMPRESSION: Vascular congestion, with mildly increased interstitial markings, raising concern for mild interstitial edema.   Electronically Signed   By: Garald Balding M.D.   On: 02/22/2014 23:42    EKG: nsr, no acute ischemic changes, normal intervals, normal axis, normal qrs complex MDM     We have treated with a dose of IV lasix in the ED and the patient is diuresing well. She was ambulated up and down the hallway without supplemental oxygen and while on pulsoximetry. She maintained oxygen sats of 100%. I do not believe that admission is indicated today. However, I have stressed the importance of close outpatient follow up to discuss diuretic use and symptomatic management.   In addition, the patient has asked that we redo her right arm splint.   Elyn Peers, MD 02/23/14 [phone]

## 2014-02-23 NOTE — ED Provider Notes (Signed)
Medical screening examination/treatment/procedure(s) were conducted as a shared visit with non-physician practitioner(s) and myself.  I personally evaluated the patient during the encounter.  Please see separate note.    Elyn Peers, MD 02/23/14 [phone]

## 2014-02-23 NOTE — ED Notes (Signed)
Pt reports swelling to left leg, lower abd and vaginal area. Pt reports she came here recently for the same and was told to follow up with pcp but states she does not have one. Pt states she is having difficulty walking due to swelling of legs.

## 2014-03-07 ENCOUNTER — Emergency Department (HOSPITAL_COMMUNITY)
Admission: EM | Admit: 2014-03-07 | Discharge: 2014-03-07 | Disposition: A | Discharge status: Home / Self Care | Payer: Medicare Other | Attending: Emergency Medicine | Admitting: Emergency Medicine

## 2014-03-07 ENCOUNTER — Encounter (HOSPITAL_COMMUNITY): Payer: Self-pay | Admitting: Emergency Medicine

## 2014-03-07 ENCOUNTER — Emergency Department (HOSPITAL_COMMUNITY): Payer: Medicare Other

## 2014-03-07 DIAGNOSIS — Y929 Unspecified place or not applicable: Secondary | ICD-10-CM | POA: Insufficient documentation

## 2014-03-07 DIAGNOSIS — G40909 Epilepsy, unspecified, not intractable, without status epilepticus: Secondary | ICD-10-CM | POA: Insufficient documentation

## 2014-03-07 DIAGNOSIS — Y939 Activity, unspecified: Secondary | ICD-10-CM | POA: Insufficient documentation

## 2014-03-07 DIAGNOSIS — S6990XA Unspecified injury of unspecified wrist, hand and finger(s), initial encounter: Secondary | ICD-10-CM | POA: Insufficient documentation

## 2014-03-07 DIAGNOSIS — I129 Hypertensive chronic kidney disease with stage 1 through stage 4 chronic kidney disease, or unspecified chronic kidney disease: Secondary | ICD-10-CM | POA: Diagnosis not present

## 2014-03-07 DIAGNOSIS — S42409A Unspecified fracture of lower end of unspecified humerus, initial encounter for closed fracture: Secondary | ICD-10-CM | POA: Insufficient documentation

## 2014-03-07 DIAGNOSIS — N189 Chronic kidney disease, unspecified: Secondary | ICD-10-CM | POA: Diagnosis not present

## 2014-03-07 DIAGNOSIS — Z8619 Personal history of other infectious and parasitic diseases: Secondary | ICD-10-CM | POA: Insufficient documentation

## 2014-03-07 DIAGNOSIS — I509 Heart failure, unspecified: Secondary | ICD-10-CM | POA: Diagnosis not present

## 2014-03-07 DIAGNOSIS — E119 Type 2 diabetes mellitus without complications: Secondary | ICD-10-CM | POA: Diagnosis not present

## 2014-03-07 DIAGNOSIS — F17200 Nicotine dependence, unspecified, uncomplicated: Secondary | ICD-10-CM | POA: Diagnosis not present

## 2014-03-07 DIAGNOSIS — Z880 Allergy status to penicillin: Secondary | ICD-10-CM | POA: Diagnosis not present

## 2014-03-07 DIAGNOSIS — R296 Repeated falls: Secondary | ICD-10-CM | POA: Insufficient documentation

## 2014-03-07 DIAGNOSIS — Z79899 Other long term (current) drug therapy: Secondary | ICD-10-CM | POA: Insufficient documentation

## 2014-03-07 DIAGNOSIS — F319 Bipolar disorder, unspecified: Secondary | ICD-10-CM | POA: Insufficient documentation

## 2014-03-07 DIAGNOSIS — Z794 Long term (current) use of insulin: Secondary | ICD-10-CM | POA: Diagnosis not present

## 2014-03-07 DIAGNOSIS — Z23 Encounter for immunization: Secondary | ICD-10-CM | POA: Insufficient documentation

## 2014-03-07 DIAGNOSIS — Z8673 Personal history of transient ischemic attack (TIA), and cerebral infarction without residual deficits: Secondary | ICD-10-CM | POA: Insufficient documentation

## 2014-03-07 DIAGNOSIS — S42401A Unspecified fracture of lower end of right humerus, initial encounter for closed fracture: Secondary | ICD-10-CM

## 2014-03-07 LAB — CBG MONITORING, ED: GLUCOSE-CAPILLARY: 239 mg/dL — AB (ref 70–99)

## 2014-03-07 MED ORDER — HYDROMORPHONE HCL PF 1 MG/ML IJ SOLN
1.0000 mg | Freq: Once | INTRAMUSCULAR | Status: AC
  Administered 2014-03-07: 1 mg via INTRAMUSCULAR
  Filled 2014-03-07: qty 1

## 2014-03-07 NOTE — ED Notes (Signed)
Patient back from x-ray 

## 2014-03-07 NOTE — Discharge Instructions (Signed)
You have a followup appointment today at 8 AM with Dr. Gladstone Lighter. Do not miss this appointment. Do not remove your splint. Keep your arm immobilized with a sling. DO NOT MISS THIS APPOINTMENT TODAY.  Humerus Fracture, Treated with Immobilization The humerus is the large bone in your upper arm. You have a broken (fractured) humerus. These fractures are easily diagnosed with X-rays. TREATMENT  Simple fractures which will heal without disability are treated with simple immobilization. Immobilization means you will wear a cast, splint, or sling. You have a fracture which will do well with immobilization. The fracture will heal well simply by being held in a good position until it is stable enough to begin range of motion exercises. Do not take part in activities which would further injure your arm.  HOME CARE INSTRUCTIONS   Put ice on the injured area.  Put ice in a plastic bag.  Place a towel between your skin and the bag.  Leave the ice on for 15-20 minutes, 03-04 times a day.  If you have a cast:  Do not scratch the skin under the cast using sharp or pointed objects.  Check the skin around the cast every day. You may put lotion on any red or sore areas.  Keep your cast dry and clean.  If you have a splint:  Wear the splint as directed.  Keep your splint dry and clean.  You may loosen the elastic around the splint if your fingers become numb, tingle, or turn cold or blue.  If you have a sling:  Wear the sling as directed.  Do not put pressure on any part of your cast or splint until it is fully hardened.  Your cast or splint can be protected during bathing with a plastic bag. Do not lower the cast or splint into water.  Only take over-the-counter or prescription medicines for pain, discomfort, or fever as directed by your caregiver.  Do range of motion exercises as instructed by your caregiver.  Follow up as directed by your caregiver. This is very important in order to avoid  permanent injury or disability and chronic pain. SEEK IMMEDIATE MEDICAL CARE IF:   Your skin or nails in the injured arm turn blue or gray.  Your arm feels cold or numb.  You develop severe pain in the injured arm.  You are having problems with the medicines you were given. MAKE SURE YOU:   Understand these instructions.  Will watch your condition.  Will get help right away if you are not doing well or get worse. Document Released: 10/28/2000 Document Revised: 10/14/2011 Document Reviewed: 09/05/2010 [facility] Patient Information 2015 [place], [place]. This information is not intended to replace advice given to you by your health care provider. Make sure you discuss any questions you have with your health care provider.

## 2014-03-07 NOTE — ED Notes (Signed)
Patient to be DC home after ortho tech applies splint per orders Patient aware of plan of care

## 2014-03-07 NOTE — ED Notes (Signed)
ED PA at bedside

## 2014-03-07 NOTE — ED Notes (Signed)
MD at bedside. 

## 2014-03-07 NOTE — ED Notes (Addendum)
Patient states that she broke her right hand two weeks ago, patient seen in ED for this Patient states "they would not set the bone" Patient states that pain is so bad that she called EMS tonight  Per patient's chart, xray of wrist taken on 7/20---no acute fx or abnormality seen

## 2014-03-07 NOTE — ED Provider Notes (Signed)
  This was a shared visit with a mid-level provided (NP or PA).  Throughout the patient's course I was available for consultation/collaboration.  I saw the ECG (if appropriate), relevant labs and studies - I agree with the interpretation.  On my exam the patient was in no distress.  She had removed her cast, and gave a complex story as to why she has not followed up as far, and when she removed her cast and for this. She had preserved sensation, motor function in the affected extremity, as well as appropriate capillary refill and pulses. After we discuss her case with her orthopedic surgeon, he graciously agreed to see the patient in one hour.      Carmin Muskrat, MD 03/07/14 [phone]

## 2014-03-07 NOTE — ED Notes (Signed)
Bed: [id number] Expected date:  Expected time:  Means of arrival:  Comments: ems 33f hand pain

## 2014-03-07 NOTE — ED Notes (Signed)
Imaging from 7/14: IMPRESSION:  Minimally displaced transcondylar right distal humeral fracture.   Patient states that she has not been wearing sling that was provided at DC, because "It hurts when I wear it."

## 2014-03-07 NOTE — ED Notes (Signed)
Patient ambulatory to bathroom without difficulty or staff assistance

## 2014-03-07 NOTE — ED Provider Notes (Signed)
CSN: [id number]     Arrival date & time 03/07/14  [id number] History   First MD Initiated Contact with Patient 03/07/14 [phone]     Chief Complaint  Patient presents with  . Hand Pain     (Consider location/radiation/quality/duration/timing/severity/associated sxs/prior Treatment) HPI Comments: Patient is a 48 year old female with a history of CHF, hypertension, diabetes mellitus, TIA, CKD, bipolar disorder, and schizoaffective disorder who presents to the emergency department today for persistent right elbow pain. Patient had a mechanical fall on 02/15/2014. She states that she has had pain in his elbow since this time. Patient was found to have a distal humeral fracture. She was splinted and referred to orthopedics. Patient states that she followed up with Dr. Gladstone Lighter 2 weeks ago where he removed the splint placed in the emergency department and placed a new splint. She states that she is scheduled to follow up with Dr. Gladstone Lighter at 1545pm today, but the pain got too bad that she needed to be evaluated. Patient states she has noted worsening swelling in her R hand. Pain in her hand, as a result, is throbbing. Patient decided to remove her splint and sling prior to arrival to try and reduce the swelling. She is now complaining that movement of her R arm causes worsening pain. She hears a "pop" when this happens. She denies fever, CP, SOB, RUE redness, and loss of sensation in her R hand. Denies new trauma/injury.  Patient is a very poor historian. She states that after her initial follow up, she missed an appointment with Dr. Gladstone Lighter secondary to hospital admission; however, patient has not been admitted to the hospital since prior to her injury. She states that her splint and sling have not been changed since followup in the orthopedic office, yet prior ED notes reference patient requesting new splint and sling placement on 02/23/2014.   Patient is a 48 y.o. female presenting with hand pain. The history is  provided by the patient. No language interpreter was used.  Hand Pain Associated symptoms include arthralgias, joint swelling and myalgias. Pertinent negatives include no numbness or weakness.    Past Medical History  Diagnosis Date  . Neuropathy   . Glaucoma   . Bipolar 1 disorder   . CHF (congestive heart failure)   . Refusal of blood transfusions as patient is Jehovah's Witness   . Hypertension   . Shortness of breath   . Diabetes mellitus     INSULIN DEPENDENT  . TIA (transient ischemic attack)   . Neuromuscular disorder     DIABETIC NEUROPATHY  . Chronic kidney disease   . Hepatitis C virus   . Seizure     diagnosed 2014  . MRSA infection   . Bipolar disorder, unspecified 02/06/2014  . Schizoaffective disorder, unspecified condition 02/08/2014   Past Surgical History  Procedure Laterality Date  . Abdominal hysterectomy    . Amputation Left 05/21/2013    Procedure: Left Midfoot AMPUTATION;  Surgeon: Newt Minion, MD;  Location: [facility];  Service: Orthopedics;  Laterality: Left;  Left Midfoot Amputation  . Tubal ligation    . Colonoscopy with propofol N/A 06/22/2013    Procedure: COLONOSCOPY WITH PROPOFOL;  Surgeon: Jeryl Columbia, MD;  Location: [facility];  Service: Endoscopy;  Laterality: N/A;   Family History  Problem Relation Age of Onset  . Hypertension Mother   . Hypertension Father    History  Substance Use Topics  . Smoking status: Current Every Day Smoker -- 0.25 packs/day for 30  years    Types: Cigarettes  . Smokeless tobacco: Never Used  . Alcohol Use: Yes     Comment: @ 1/week   OB History   Grav Para Term Preterm Abortions TAB SAB Ect Mult Living                  Review of Systems  Musculoskeletal: Positive for arthralgias, joint swelling and myalgias.  Skin: Negative for color change.  Neurological: Negative for syncope, weakness and numbness.  All other systems reviewed and are negative.    Allergies  Gabapentin; Saphris; Tramadol; and  Penicillins  Home Medications   Prior to Admission medications   Medication Sig Start Date End Date Taking? Authorizing Provider  carvedilol (COREG) 12.5 MG tablet Take 12.5 mg by mouth 2 (two) times daily.    Yes Historical Provider, MD  cloNIDine (CATAPRES) 0.1 MG tablet Take 0.1 mg by mouth daily.   Yes Historical Provider, MD  DULoxetine (CYMBALTA) 60 MG capsule Take 60 mg by mouth daily.   Yes Historical Provider, MD  folic acid (FOLVITE) A999333 MCG tablet Take 400 mcg by mouth every morning.   Yes Historical Provider, MD  furosemide (LASIX) 20 MG tablet Take 20 mg by mouth every morning.   Yes Historical Provider, MD  insulin aspart (NOVOLOG) 100 UNIT/ML injection Inject 2-10 Units into the skin 3 (three) times daily before meals. SSI: 0-200 = 2 units, 201-250 = 4 units, 251-300 = 6 units, 301-350 = 8 units, 351-400 = 10 units   Yes Historical Provider, MD  insulin glargine (LANTUS) 100 UNIT/ML injection Inject 45 Units into the skin at bedtime.    Yes Historical Provider, MD  levETIRAcetam (KEPPRA) 500 MG tablet Take 500 mg by mouth 2 (two) times daily.   Yes Historical Provider, MD  Multiple Vitamins-Minerals (SENIOR MULTIVITAMIN PLUS PO) Take 1 tablet by mouth every morning.   Yes Historical Provider, MD  oxyCODONE-acetaminophen (PERCOCET/ROXICET) 5-325 MG per tablet Take 1 tablet by mouth every 6 (six) hours as needed for severe pain. 02/15/14  Yes Resa Miner Lawyer, PA-C  polyethylene glycol (MIRALAX / GLYCOLAX) packet Take 17 g by mouth 2 (two) times daily.   Yes Historical Provider, MD  pregabalin (LYRICA) 75 MG capsule Take 75 mg by mouth 2 (two) times daily.   Yes Historical Provider, MD  simethicone (MYLICON) 80 MG chewable tablet Chew 80 mg by mouth every 8 (eight) hours as needed for flatulence.   Yes Historical Provider, MD   BP 205/93  Pulse 90  Temp(Src) 98.4 F (36.9 C) (Oral)  Resp 20  SpO2 96%  Physical Exam  Nursing note and vitals reviewed. Constitutional: She is  oriented to person, place, and time. She appears well-developed and well-nourished. No distress.  Nontoxic/nonseptic appearing  HENT:  Head: Normocephalic and atraumatic.  Eyes: Conjunctivae and EOM are normal. No scleral icterus.  Neck: Normal range of motion.  Cardiovascular: Normal rate, regular rhythm and intact distal pulses.   Distal radial pulse 2+ in RUE. Capillary refill brisk in all fingers of R hand.  Pulmonary/Chest: Effort normal. No respiratory distress.  Chest expansion symmetric; no tachypnea or dyspnea  Musculoskeletal:       Right elbow: She exhibits decreased range of motion. She exhibits no swelling, no effusion and no deformity. Tenderness found.       Right hand: She exhibits tenderness (mild) and swelling. She exhibits normal range of motion, no bony tenderness, normal capillary refill and no deformity. Normal sensation noted. Normal strength  noted.  Limited ROM of R elbow secondary to pain and crepitus. No obvious deformity. Soft tissue swelling of R hand noted. No erythema or heat to touch.  Neurological: She is alert and oriented to person, place, and time. She exhibits normal muscle tone. Coordination normal.  No gross sensory deficits appreciated. Good RUE grip strength.  Skin: Skin is warm and dry. No rash noted. She is not diaphoretic. No erythema. No pallor.  Psychiatric: She has a normal mood and affect. Her behavior is normal.    ED Course  Procedures (including critical care time) Labs Review Labs Reviewed  CBG MONITORING, ED - Abnormal; Notable for the following:    Glucose-Capillary 239 (*)    All other components within normal limits    Imaging Review Dg Elbow Complete Right  03/07/2014   CLINICAL DATA:  Worsening pain.  Recent fracture.  EXAM: RIGHT ELBOW - COMPLETE 3+ VIEW  COMPARISON:  02/15/2014  FINDINGS: There is a transverse fracture through the supracondylar humerus. No definitive changes of osseous healing. There is medial displacement which  is increased from prior, now up to 7 mm in the frontal projection. On going surrounding soft tissue swelling and joint effusion.  IMPRESSION: Subacute supracondylar humerus fracture. Medial displacement is increased from 02/15/2014.   Electronically Signed   By: Jorje Guild M.D.   On: 03/07/2014 05:09     EKG Interpretation None      MDM   Final diagnoses:  Humerus distal fracture, right, closed, initial encounter    48 year old female with an extensive past medical history presents to the emergency department for worsening right elbow pain. Patient with initial injury on 02/15/2014. She states that she has followed up with Dr. Gladstone Lighter in the office who changed her splint; however, patient is a poor historian. She states that she missed a second followup appointment secondary to hospital admission, however, patient has not been admitted to the hospital since her initial injury. Patient also had splint redone while in the emergency department on 02/23/2014.  Patient is neurovascularly intact today. She has some soft tissue swelling noted to her right hand without pitting edema. Distal pulses and capillary refill intact and equal bilaterally. No erythema or heat to touch. No evidence of septic joint. X-ray repeated today which shows medial displacement increased from 02/15/2014. I have consulted with Dr. Gladstone Lighter regarding patient case and he recommends office f/u at [facility] today.   Have discussed the plan with the patient. Patient initially very resistant to plan for followup at 8 AM today. I have stressed to the patient that if she does not follow up, her condition may continue to worsen. She states that she needs to be brought to her appointments by a formal transport system and cannot make an appointment for this immediately. I have asked the patient how she will be getting home from her visit in the emergency department today, to which she states her sister will be picking her up. I recommended  to the patient that her sister drive her directly from emergency department to the office of Dr. Gladstone Lighter for follow up. Patient seems to have little concern for her worsening fracture and is instead more interested in the type of splint she will receive. Have urged, again, that she follow up as scheduled this morning. Patient verbalizes understanding of plan. She is stable for discharge in hopes that she will present to the office of Dr. Gladstone Lighter this morning.     Antonietta Breach, [place] 03/07/14 [phone]

## 2014-03-07 NOTE — ED Notes (Signed)
Patient was educated not to drive, operate heavy machinery, or drink alcohol while taking narcotic medication.  

## 2014-03-07 NOTE — ED Notes (Signed)
Patient transported to X-ray 

## 2014-03-07 NOTE — ED Notes (Signed)
Unit secretary informed of need to page ortho tech

## 2014-03-07 NOTE — ED Notes (Signed)
Recent VS noted Patient denies c/o headache, blurred vision or other sx r/t hypertension EDP aware Patient continues to await ortho tech and then DC home

## 2014-03-10 ENCOUNTER — Encounter (HOSPITAL_BASED_OUTPATIENT_CLINIC_OR_DEPARTMENT_OTHER): Payer: Medicare Other | Attending: Internal Medicine

## 2014-03-10 DIAGNOSIS — E119 Type 2 diabetes mellitus without complications: Secondary | ICD-10-CM | POA: Diagnosis not present

## 2014-03-10 DIAGNOSIS — Z87891 Personal history of nicotine dependence: Secondary | ICD-10-CM | POA: Insufficient documentation

## 2014-03-10 DIAGNOSIS — I10 Essential (primary) hypertension: Secondary | ICD-10-CM | POA: Diagnosis not present

## 2014-03-10 DIAGNOSIS — Z794 Long term (current) use of insulin: Secondary | ICD-10-CM | POA: Diagnosis not present

## 2014-03-10 DIAGNOSIS — T8189XA Other complications of procedures, not elsewhere classified, initial encounter: Secondary | ICD-10-CM | POA: Insufficient documentation

## 2014-03-10 DIAGNOSIS — S98919A Complete traumatic amputation of unspecified foot, level unspecified, initial encounter: Secondary | ICD-10-CM | POA: Insufficient documentation

## 2014-03-10 LAB — GLUCOSE, CAPILLARY: Glucose-Capillary: 233 mg/dL — ABNORMAL HIGH (ref 70–99)

## 2014-03-10 NOTE — Progress Notes (Signed)
[facility]  NAME:  [name], [name]                       ACCOUNT NO.:  MEDICAL RECORD NO.:  [id number]      DATE OF BIRTH:  13-Sep-1965  PHYSICIAN:  Judene Companion, M.D.           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  She has increased talus with a small sinus tract in the middle of the callus.  I debrided this callus off with a #10 blade, and it really shallowed out, and it is of not a lot of significance.  I packed in the crack of this nonhealing wound some silver alginate but I think this will heal very easily.  This lady has a history of type 2 diabetes, hypertension, and cardiomyopathy.  PHYSICAL EXAMINATION:  When she came here today, she was found to have a blood pressure of 159/100, pulse 90, temperature 98.  She weighs 173 pounds.  MEDICINES:  Include clonidine, vitamins, Lasix, NovoLog insulin, Lantus insulin, Keppra, MiraLAX, Lyrica, and carvedilol.  ASSESSMENT AND PLAN:  She will dress this herself at home and put a silver alginate in the wound, and I will see her next week.  I looked for this to heal quickly.  DIAGNOSIS:  Nonhealing surgical wound from the left transmetatarsal amputation.  OTHER DIAGNOSES:  Type 2 diabetes and hypertension.     Judene Companion, M.D.     PP/MEDQ  D:  03/10/2014  T:  03/10/2014  Job:  [id number]

## 2014-03-17 DIAGNOSIS — T8189XA Other complications of procedures, not elsewhere classified, initial encounter: Secondary | ICD-10-CM | POA: Diagnosis not present

## 2014-03-17 DIAGNOSIS — E119 Type 2 diabetes mellitus without complications: Secondary | ICD-10-CM | POA: Diagnosis not present

## 2014-03-17 DIAGNOSIS — Z794 Long term (current) use of insulin: Secondary | ICD-10-CM | POA: Diagnosis not present

## 2014-03-17 DIAGNOSIS — I10 Essential (primary) hypertension: Secondary | ICD-10-CM | POA: Diagnosis not present

## 2014-04-11 ENCOUNTER — Emergency Department (HOSPITAL_COMMUNITY): Payer: Medicare Other

## 2014-04-11 ENCOUNTER — Encounter (HOSPITAL_COMMUNITY): Payer: Self-pay | Admitting: Emergency Medicine

## 2014-04-11 ENCOUNTER — Inpatient Hospital Stay (HOSPITAL_COMMUNITY)
Admission: EM | Admit: 2014-04-11 | Discharge: 2014-04-14 | DRG: 074 | Disposition: A | Discharge status: Home / Self Care | Payer: Medicare Other | Attending: Internal Medicine | Admitting: Internal Medicine

## 2014-04-11 DIAGNOSIS — E1129 Type 2 diabetes mellitus with other diabetic kidney complication: Secondary | ICD-10-CM | POA: Diagnosis present

## 2014-04-11 DIAGNOSIS — B182 Chronic viral hepatitis C: Secondary | ICD-10-CM | POA: Diagnosis present

## 2014-04-11 DIAGNOSIS — F1410 Cocaine abuse, uncomplicated: Secondary | ICD-10-CM

## 2014-04-11 DIAGNOSIS — F259 Schizoaffective disorder, unspecified: Secondary | ICD-10-CM | POA: Diagnosis present

## 2014-04-11 DIAGNOSIS — I10 Essential (primary) hypertension: Secondary | ICD-10-CM | POA: Diagnosis present

## 2014-04-11 DIAGNOSIS — H409 Unspecified glaucoma: Secondary | ICD-10-CM | POA: Diagnosis present

## 2014-04-11 DIAGNOSIS — L97909 Non-pressure chronic ulcer of unspecified part of unspecified lower leg with unspecified severity: Secondary | ICD-10-CM | POA: Diagnosis present

## 2014-04-11 DIAGNOSIS — L03116 Cellulitis of left lower limb: Secondary | ICD-10-CM

## 2014-04-11 DIAGNOSIS — E1149 Type 2 diabetes mellitus with other diabetic neurological complication: Principal | ICD-10-CM | POA: Diagnosis present

## 2014-04-11 DIAGNOSIS — I509 Heart failure, unspecified: Secondary | ICD-10-CM | POA: Diagnosis present

## 2014-04-11 DIAGNOSIS — E11621 Type 2 diabetes mellitus with foot ulcer: Secondary | ICD-10-CM

## 2014-04-11 DIAGNOSIS — E872 Acidosis, unspecified: Secondary | ICD-10-CM | POA: Diagnosis present

## 2014-04-11 DIAGNOSIS — D500 Iron deficiency anemia secondary to blood loss (chronic): Secondary | ICD-10-CM | POA: Diagnosis present

## 2014-04-11 DIAGNOSIS — I5032 Chronic diastolic (congestive) heart failure: Secondary | ICD-10-CM | POA: Diagnosis present

## 2014-04-11 DIAGNOSIS — L97529 Non-pressure chronic ulcer of other part of left foot with unspecified severity: Secondary | ICD-10-CM

## 2014-04-11 DIAGNOSIS — N390 Urinary tract infection, site not specified: Secondary | ICD-10-CM

## 2014-04-11 DIAGNOSIS — N058 Unspecified nephritic syndrome with other morphologic changes: Secondary | ICD-10-CM | POA: Diagnosis present

## 2014-04-11 DIAGNOSIS — L02419 Cutaneous abscess of limb, unspecified: Secondary | ICD-10-CM

## 2014-04-11 DIAGNOSIS — E860 Dehydration: Secondary | ICD-10-CM | POA: Diagnosis present

## 2014-04-11 DIAGNOSIS — Z91199 Patient's noncompliance with other medical treatment and regimen due to unspecified reason: Secondary | ICD-10-CM

## 2014-04-11 DIAGNOSIS — Z79899 Other long term (current) drug therapy: Secondary | ICD-10-CM

## 2014-04-11 DIAGNOSIS — G40909 Epilepsy, unspecified, not intractable, without status epilepticus: Secondary | ICD-10-CM | POA: Diagnosis present

## 2014-04-11 DIAGNOSIS — F319 Bipolar disorder, unspecified: Secondary | ICD-10-CM

## 2014-04-11 DIAGNOSIS — E1122 Type 2 diabetes mellitus with diabetic chronic kidney disease: Secondary | ICD-10-CM

## 2014-04-11 DIAGNOSIS — M7989 Other specified soft tissue disorders: Secondary | ICD-10-CM | POA: Diagnosis not present

## 2014-04-11 DIAGNOSIS — L97509 Non-pressure chronic ulcer of other part of unspecified foot with unspecified severity: Secondary | ICD-10-CM

## 2014-04-11 DIAGNOSIS — E1159 Type 2 diabetes mellitus with other circulatory complications: Secondary | ICD-10-CM

## 2014-04-11 DIAGNOSIS — F17200 Nicotine dependence, unspecified, uncomplicated: Secondary | ICD-10-CM | POA: Diagnosis present

## 2014-04-11 DIAGNOSIS — Z9119 Patient's noncompliance with other medical treatment and regimen: Secondary | ICD-10-CM

## 2014-04-11 DIAGNOSIS — Z8673 Personal history of transient ischemic attack (TIA), and cerebral infarction without residual deficits: Secondary | ICD-10-CM

## 2014-04-11 DIAGNOSIS — E1100 Type 2 diabetes mellitus with hyperosmolarity without nonketotic hyperglycemic-hyperosmolar coma (NKHHC): Secondary | ICD-10-CM

## 2014-04-11 DIAGNOSIS — L03119 Cellulitis of unspecified part of limb: Secondary | ICD-10-CM

## 2014-04-11 DIAGNOSIS — N185 Chronic kidney disease, stage 5: Secondary | ICD-10-CM | POA: Diagnosis present

## 2014-04-11 DIAGNOSIS — E1165 Type 2 diabetes mellitus with hyperglycemia: Secondary | ICD-10-CM | POA: Diagnosis present

## 2014-04-11 DIAGNOSIS — E1142 Type 2 diabetes mellitus with diabetic polyneuropathy: Secondary | ICD-10-CM | POA: Diagnosis present

## 2014-04-11 DIAGNOSIS — Z794 Long term (current) use of insulin: Secondary | ICD-10-CM

## 2014-04-11 DIAGNOSIS — N184 Chronic kidney disease, stage 4 (severe): Secondary | ICD-10-CM

## 2014-04-11 DIAGNOSIS — L02619 Cutaneous abscess of unspecified foot: Secondary | ICD-10-CM | POA: Diagnosis present

## 2014-04-11 DIAGNOSIS — S42309D Unspecified fracture of shaft of humerus, unspecified arm, subsequent encounter for fracture with routine healing: Secondary | ICD-10-CM

## 2014-04-11 DIAGNOSIS — D509 Iron deficiency anemia, unspecified: Secondary | ICD-10-CM | POA: Diagnosis present

## 2014-04-11 DIAGNOSIS — I129 Hypertensive chronic kidney disease with stage 1 through stage 4 chronic kidney disease, or unspecified chronic kidney disease: Secondary | ICD-10-CM | POA: Diagnosis present

## 2014-04-11 DIAGNOSIS — S98919A Complete traumatic amputation of unspecified foot, level unspecified, initial encounter: Secondary | ICD-10-CM

## 2014-04-11 DIAGNOSIS — S42301G Unspecified fracture of shaft of humerus, right arm, subsequent encounter for fracture with delayed healing: Secondary | ICD-10-CM

## 2014-04-11 DIAGNOSIS — R739 Hyperglycemia, unspecified: Secondary | ICD-10-CM

## 2014-04-11 LAB — COMPREHENSIVE METABOLIC PANEL
ALBUMIN: 1.8 g/dL — AB (ref 3.5–5.2)
ALT: 22 U/L (ref 0–35)
AST: 19 U/L (ref 0–37)
Alkaline Phosphatase: 91 U/L (ref 39–117)
Anion gap: 15 (ref 5–15)
BUN: 28 mg/dL — ABNORMAL HIGH (ref 6–23)
CO2: 19 mEq/L (ref 19–32)
CREATININE: 2.37 mg/dL — AB (ref 0.50–1.10)
Calcium: 8.3 mg/dL — ABNORMAL LOW (ref 8.4–10.5)
Chloride: 98 mEq/L (ref 96–112)
GFR calc Af Amer: 27 mL/min — ABNORMAL LOW (ref >90)
GFR calc non Af Amer: 23 mL/min — ABNORMAL LOW (ref >90)
GLUCOSE: 574 mg/dL — AB (ref 70–99)
Potassium: 3.8 mEq/L (ref 3.7–5.3)
Sodium: 132 mEq/L — ABNORMAL LOW (ref 137–147)
TOTAL PROTEIN: 6.7 g/dL (ref 6.0–8.3)

## 2014-04-11 LAB — CBG MONITORING, ED
GLUCOSE-CAPILLARY: 491 mg/dL — AB (ref 70–99)
GLUCOSE-CAPILLARY: 249 mg/dL — AB (ref 70–99)
Glucose-Capillary: 199 mg/dL — ABNORMAL HIGH (ref 70–99)

## 2014-04-11 LAB — RAPID URINE DRUG SCREEN, HOSP PERFORMED
AMPHETAMINES: NOT DETECTED
BARBITURATES: NOT DETECTED
BENZODIAZEPINES: NOT DETECTED
Cocaine: POSITIVE — AB
Opiates: NOT DETECTED
Tetrahydrocannabinol: NOT DETECTED

## 2014-04-11 LAB — CBC WITH DIFFERENTIAL/PLATELET
BASOS PCT: 0 % (ref 0–1)
Basophils Absolute: 0 10*3/uL (ref 0.0–0.1)
Eosinophils Absolute: 0.1 10*3/uL (ref 0.0–0.7)
Eosinophils Relative: 2 % (ref 0–5)
HEMATOCRIT: 25.4 % — AB (ref 36.0–46.0)
HEMOGLOBIN: 8.3 g/dL — AB (ref 12.0–15.0)
Lymphocytes Relative: 27 % (ref 12–46)
Lymphs Abs: 1.2 10*3/uL (ref 0.7–4.0)
MCH: 27.9 pg (ref 26.0–34.0)
MCHC: 32.7 g/dL (ref 30.0–36.0)
MCV: 85.2 fL (ref 78.0–100.0)
MONO ABS: 0.2 10*3/uL (ref 0.1–1.0)
Monocytes Relative: 6 % (ref 3–12)
Neutro Abs: 2.8 10*3/uL (ref 1.7–7.7)
Neutrophils Relative %: 65 % (ref 43–77)
Platelets: 249 10*3/uL (ref 150–400)
RBC: 2.98 MIL/uL — ABNORMAL LOW (ref 3.87–5.11)
RDW: 14.9 % (ref 11.5–15.5)
WBC: 4.3 10*3/uL (ref 4.0–10.5)

## 2014-04-11 LAB — URINALYSIS, ROUTINE W REFLEX MICROSCOPIC
BILIRUBIN URINE: NEGATIVE
Glucose, UA: >1000 mg/dL — AB
Ketones, ur: NEGATIVE mg/dL
Nitrite: NEGATIVE
Protein, ur: >300 mg/dL — AB
SPECIFIC GRAVITY, URINE: 1.015 (ref 1.005–1.030)
UROBILINOGEN UA: 0.2 mg/dL (ref 0.0–1.0)
pH: 6.5 (ref 5.0–8.0)

## 2014-04-11 LAB — TROPONIN I

## 2014-04-11 LAB — URINE MICROSCOPIC-ADD ON

## 2014-04-11 LAB — KETONES, QUALITATIVE: Acetone, Bld: NEGATIVE

## 2014-04-11 MED ORDER — INSULIN ASPART 100 UNIT/ML IV SOLN
7.0000 [IU] | Freq: Once | INTRAVENOUS | Status: AC
  Administered 2014-04-11: 7 [IU] via INTRAVENOUS
  Filled 2014-04-11: qty 0.07

## 2014-04-11 MED ORDER — SODIUM CHLORIDE 0.9 % IV BOLUS (SEPSIS)
500.0000 mL | Freq: Once | INTRAVENOUS | Status: AC
  Administered 2014-04-11: 500 mL via INTRAVENOUS

## 2014-04-11 MED ORDER — OXYCODONE-ACETAMINOPHEN 5-325 MG PO TABS
1.0000 | ORAL_TABLET | Freq: Once | ORAL | Status: AC
  Administered 2014-04-11: 1 via ORAL
  Filled 2014-04-11: qty 1

## 2014-04-11 MED ORDER — CLONIDINE HCL 0.1 MG PO TABS
0.1000 mg | ORAL_TABLET | Freq: Once | ORAL | Status: AC
  Administered 2014-04-11: 0.1 mg via ORAL
  Filled 2014-04-11: qty 1

## 2014-04-11 MED ORDER — CARVEDILOL 12.5 MG PO TABS
12.5000 mg | ORAL_TABLET | Freq: Once | ORAL | Status: AC
  Administered 2014-04-11: 12.5 mg via ORAL
  Filled 2014-04-11: qty 1

## 2014-04-11 NOTE — ED Notes (Signed)
Unable to get EKG due to Ct having patient

## 2014-04-11 NOTE — ED Provider Notes (Signed)
CSN: [id number]     Arrival date & time 04/11/14  1443 History   First MD Initiated Contact with Patient 04/11/14 1621     Chief Complaint  Patient presents with  . Wound Check    left foot     (Consider location/radiation/quality/duration/timing/severity/associated sxs/prior Treatment) The history is provided by the patient. No language interpreter was used.  [name] is a 48 y/o F with PMHx of neuropathy, glaucoma, DM type II, HTN, TIA, neuromuscular disorder, Hep C, seizures, MRSA infection, schizophrenia presenting to the ED with right arm pain and left foot pain. Patient reported that she was seen in the ED on 03/07/2014 regarding a fall that led to a humeral fracture of her right arm. Patient reported that she was placed in a splint and followed up with Dr. Cay Schillings, orthopedics a week later where a cast was placed. Patient reported that approximately 2 weeks ago the cast was removed and a splint that was removable that could be removed for showers was placed. Stated that she had a fall approximately 3 days ago where she landed on her right elbow - stated that she has been experiencing intense pain to her right elbow and humerus described as a deep aching sensation that is constant. Stated that she noticed increased swelling to her right arm down into her hand and fingers with increased weakness. Stated that there right upper extremity feels cold to the touch. Stated that she had an appointment with Dr. Cay Schillings yesterday, but missed the appointment. Patient reported that she noticed swelling and drainage of a foul odor to her left midfoot where her amputation was performed by Dr. Sharol Given in October of 2014 - started 4 days ago. Patient reported that she has been having swelling to the foot along with warmth to the touch. Reported that she has not been taking any of her medications for her blood pressure and for her DM. Stated that she smoked cocaine yesterday approximately a "20" as per patient.  Stated that she's been having mild shortness of breath with ambulation. Denied fever, chills, chest pain, difficulty breathing, nausea, vomiting, diarrhea. PCP Dr. Justin Mend   Past Medical History  Diagnosis Date  . Neuropathy   . Glaucoma   . Bipolar 1 disorder   . CHF (congestive heart failure)   . Refusal of blood transfusions as patient is Jehovah's Witness   . Hypertension   . Shortness of breath   . Diabetes mellitus     INSULIN DEPENDENT  . TIA (transient ischemic attack)   . Neuromuscular disorder     DIABETIC NEUROPATHY  . Chronic kidney disease   . Hepatitis C virus   . Seizure     diagnosed 2014  . MRSA infection   . Bipolar disorder, unspecified 02/06/2014  . Schizoaffective disorder, unspecified condition 02/08/2014   Past Surgical History  Procedure Laterality Date  . Abdominal hysterectomy    . Amputation Left 05/21/2013    Procedure: Left Midfoot AMPUTATION;  Surgeon: Newt Minion, MD;  Location: [facility];  Service: Orthopedics;  Laterality: Left;  Left Midfoot Amputation  . Tubal ligation    . Colonoscopy with propofol N/A 06/22/2013    Procedure: COLONOSCOPY WITH PROPOFOL;  Surgeon: Jeryl Columbia, MD;  Location: [facility];  Service: Endoscopy;  Laterality: N/A;   Family History  Problem Relation Age of Onset  . Hypertension Mother   . Hypertension Father    History  Substance Use Topics  . Smoking status: Current Every Day  Smoker -- 0.25 packs/day for 30 years    Types: Cigarettes  . Smokeless tobacco: Never Used  . Alcohol Use: Yes     Comment: @ 1/week   OB History   Grav Para Term Preterm Abortions TAB SAB Ect Mult Living                 Review of Systems  Constitutional: Negative for fever and chills.  Eyes: Negative for visual disturbance.  Respiratory: Positive for shortness of breath. Negative for chest tightness.   Cardiovascular: Negative for chest pain.  Gastrointestinal: Negative for nausea, vomiting, abdominal pain and diarrhea.   Musculoskeletal: Positive for arthralgias (right arm pain ).  Neurological: Positive for weakness and numbness.      Allergies  Gabapentin; Saphris; Tramadol; and Penicillins  Home Medications   Prior to Admission medications   Medication Sig Start Date End Date Taking? Authorizing Provider  carvedilol (COREG) 12.5 MG tablet Take 12.5 mg by mouth 2 (two) times daily.    Yes Historical Provider, MD  cloNIDine (CATAPRES) 0.1 MG tablet Take 0.1 mg by mouth daily.   Yes Historical Provider, MD  DULoxetine (CYMBALTA) 60 MG capsule Take 60 mg by mouth daily.   Yes Historical Provider, MD  folic acid (FOLVITE) A999333 MCG tablet Take 400 mcg by mouth every morning.   Yes Historical Provider, MD  furosemide (LASIX) 20 MG tablet Take 20 mg by mouth every morning.   Yes Historical Provider, MD  insulin aspart (NOVOLOG) 100 UNIT/ML injection Inject 2-10 Units into the skin 3 (three) times daily before meals. SSI: 0-200 = 2 units, 201-250 = 4 units, 251-300 = 6 units, 301-350 = 8 units, 351-400 = 10 units   Yes Historical Provider, MD  insulin glargine (LANTUS) 100 UNIT/ML injection Inject 45 Units into the skin at bedtime.    Yes Historical Provider, MD  levETIRAcetam (KEPPRA) 500 MG tablet Take 500 mg by mouth 2 (two) times daily.   Yes Historical Provider, MD  Multiple Vitamins-Minerals (SENIOR MULTIVITAMIN PLUS PO) Take 1 tablet by mouth every morning.   Yes Historical Provider, MD  oxyCODONE-acetaminophen (PERCOCET/ROXICET) 5-325 MG per tablet Take 1 tablet by mouth every 6 (six) hours as needed for severe pain. 02/15/14  Yes Resa Miner Lawyer, PA-C  polyethylene glycol (MIRALAX / GLYCOLAX) packet Take 17 g by mouth 2 (two) times daily.   Yes Historical Provider, MD  pregabalin (LYRICA) 75 MG capsule Take 75 mg by mouth 2 (two) times daily.   Yes Historical Provider, MD  simethicone (MYLICON) 80 MG chewable tablet Chew 80 mg by mouth every 8 (eight) hours as needed for flatulence.   Yes Historical  Provider, MD   BP 156/87  Pulse 85  Temp(Src) 97.6 F (36.4 C) (Axillary)  Resp 20  Ht 5\' 4"  (1.626 m)  Wt 190 lb 14.4 oz (86.592 kg)  BMI 32.75 kg/m2  SpO2 100% Physical Exam  Nursing note and vitals reviewed. Constitutional: She is oriented to person, place, and time. She appears well-developed and well-nourished. No distress.  HENT:  Head: Normocephalic and atraumatic.  Mouth/Throat: Oropharynx is clear and moist. No oropharyngeal exudate.  Eyes: Conjunctivae and EOM are normal. Pupils are equal, round, and reactive to light. Right eye exhibits no discharge. Left eye exhibits no discharge.  Neck: Normal range of motion. Neck supple. No tracheal deviation present.  Cardiovascular: Regular rhythm and normal heart sounds.  Exam reveals no friction rub.   No murmur heard. Pulses:  Radial pulses are 2+ on the right side, and 2+ on the left side.       Dorsalis pedis pulses are 2+ on the right side, and 2+ on the left side.  Mild tachycardia upon auscultation Cap refill less than 3 seconds  Pulmonary/Chest: Effort normal and breath sounds normal. No respiratory distress. She has no wheezes. She has no rales.  Musculoskeletal: She exhibits edema and tenderness.  Swelling identified to the right upper extremity extending from the distal right humerus down to the digits of the right hand. Decreased range of motion to the right shoulder and right elbow secondary to pain. Full range of motion to the right wrist. Decreased supination and pronation secondary to pain. Patient is able to produce a fist without difficulty. Compartment soft.   Lymphadenopathy:    She has no cervical adenopathy.  Neurological: She is alert and oriented to person, place, and time. No cranial nerve deficit. She exhibits normal muscle tone. Coordination normal.  Cranial nerves III-XII grossly intact Strength 5+/5+ to upper and lower extremities bilaterally with resistance applied, equal distribution  noted Strength intact to MCP, PIP, DIP joints of right hand Sensation intact  Negative facial drooping  Negative slurred speech  Negative aphasia Negative arm drift  Skin: Skin is warm. She is not diaphoretic.  Left midfoot amputation identified with area of macerated tissue with odor. Discomfort upon palpation. Negative active bleeding. Warmth upon palpation. Negative surrounding erythema noted. Drainage of yellow fluid with odor noted on bandage.   Psychiatric: She has a normal mood and affect. Her behavior is normal. Thought content normal.    ED Course  Procedures (including critical care time)  7:12 PM This provider spoke with Dr. Nyoka Cowden, radiologist-as per radiologist reported that the plain films from a 03/07/2014 are the same - no acute findings.  7:48 As per attending physician, Dr. Jerilynn Mages. Gentry recommended IV fluids, IV insulin of 7 units, pain medications and orthopedics to be consulted.   8:07 PM This provider spoke with Dr. Maureen Ralphs - orthopedics. Discussed case, imaging, and physical exam in great detail. Reported that patient is to be placed in a long arm splint and sling. Patient is to call physician's office tomorrow.  11:33 PM This provider re-assessed the patient. Patient sitting upright comfortable. Patient denied pain. Patient denied chest pain, shortness of breath, difficulty breathing.   1:12 AM This provider spoke with Dr. Maryland Pink from radiologist regarding defect in the distal aspect of the stump - radiologist does not believe this to be gangrene.   1:55 AM This provider spoke with Dr. Posey Pronto, [facility] - discussed case, history, labs, imaging, vitals, ED course in great detail. Patient to be admitted to the hospital - admit to Telemetry. Admitting physician agreed to IV clindamycin.   Results for orders placed during the hospital encounter of 04/11/14  CBC WITH DIFFERENTIAL      Result Value Ref Range   WBC 4.3  4.0 - 10.5 K/uL   RBC 2.98 (*) 3.87 - 5.11  MIL/uL   Hemoglobin 8.3 (*) 12.0 - 15.0 g/dL   HCT 25.4 (*) 36.0 - 46.0 %   MCV 85.2  78.0 - 100.0 fL   MCH 27.9  26.0 - 34.0 pg   MCHC 32.7  30.0 - 36.0 g/dL   RDW 14.9  11.5 - 15.5 %   Platelets 249  150 - 400 K/uL   Neutrophils Relative % 65  43 - 77 %   Neutro Abs 2.8  1.7 -  7.7 K/uL   Lymphocytes Relative 27  12 - 46 %   Lymphs Abs 1.2  0.7 - 4.0 K/uL   Monocytes Relative 6  3 - 12 %   Monocytes Absolute 0.2  0.1 - 1.0 K/uL   Eosinophils Relative 2  0 - 5 %   Eosinophils Absolute 0.1  0.0 - 0.7 K/uL   Basophils Relative 0  0 - 1 %   Basophils Absolute 0.0  0.0 - 0.1 K/uL  COMPREHENSIVE METABOLIC PANEL      Result Value Ref Range   Sodium 132 (*) 137 - 147 mEq/L   Potassium 3.8  3.7 - 5.3 mEq/L   Chloride 98  96 - 112 mEq/L   CO2 19  19 - 32 mEq/L   Glucose, Bld 574 (*) 70 - 99 mg/dL   BUN 28 (*) 6 - 23 mg/dL   Creatinine, Ser 2.37 (*) 0.50 - 1.10 mg/dL   Calcium 8.3 (*) 8.4 - 10.5 mg/dL   Total Protein 6.7  6.0 - 8.3 g/dL   Albumin 1.8 (*) 3.5 - 5.2 g/dL   AST 19  0 - 37 U/L   ALT 22  0 - 35 U/L   Alkaline Phosphatase 91  39 - 117 U/L   Total Bilirubin <0.2 (*) 0.3 - 1.2 mg/dL   GFR calc non Af Amer 23 (*) >90 mL/min   GFR calc Af Amer 27 (*) >90 mL/min   Anion gap 15  5 - 15  URINALYSIS, ROUTINE W REFLEX MICROSCOPIC      Result Value Ref Range   Color, Urine YELLOW  YELLOW   APPearance CLEAR  CLEAR   Specific Gravity, Urine 1.015  1.005 - 1.030   pH 6.5  5.0 - 8.0   Glucose, UA >1000 (*) NEGATIVE mg/dL   Hgb urine dipstick SMALL (*) NEGATIVE   Bilirubin Urine NEGATIVE  NEGATIVE   Ketones, ur NEGATIVE  NEGATIVE mg/dL   Protein, ur >300 (*) NEGATIVE mg/dL   Urobilinogen, UA 0.2  0.0 - 1.0 mg/dL   Nitrite NEGATIVE  NEGATIVE   Leukocytes, UA SMALL (*) NEGATIVE  KETONES, QUALITATIVE      Result Value Ref Range   Acetone, Bld NEGATIVE  NEGATIVE  TROPONIN I      Result Value Ref Range   Troponin I <0.30  <0.30 ng/mL  URINE RAPID DRUG SCREEN (HOSP PERFORMED)       Result Value Ref Range   Opiates NONE DETECTED  NONE DETECTED   Cocaine POSITIVE (*) NONE DETECTED   Benzodiazepines NONE DETECTED  NONE DETECTED   Amphetamines NONE DETECTED  NONE DETECTED   Tetrahydrocannabinol NONE DETECTED  NONE DETECTED   Barbiturates NONE DETECTED  NONE DETECTED  URINE MICROSCOPIC-ADD ON      Result Value Ref Range   Squamous Epithelial / LPF RARE  RARE   WBC, UA 7-10  <3 WBC/hpf   RBC / HPF 3-6  <3 RBC/hpf  BASIC METABOLIC PANEL      Result Value Ref Range   Sodium 134 (*) 137 - 147 mEq/L   Potassium 3.5 (*) 3.7 - 5.3 mEq/L   Chloride 102  96 - 112 mEq/L   CO2 17 (*) 19 - 32 mEq/L   Glucose, Bld 214 (*) 70 - 99 mg/dL   BUN 26 (*) 6 - 23 mg/dL   Creatinine, Ser 2.20 (*) 0.50 - 1.10 mg/dL   Calcium 8.3 (*) 8.4 - 10.5 mg/dL   GFR calc non Af Wyvonnia Lora  25 (*) >90 mL/min   GFR calc Af Amer 29 (*) >90 mL/min   Anion gap 15  5 - 15  TROPONIN I      Result Value Ref Range   Troponin I <0.30  <0.30 ng/mL  CBG MONITORING, ED      Result Value Ref Range   Glucose-Capillary 491 (*) 70 - 99 mg/dL  CBG MONITORING, ED      Result Value Ref Range   Glucose-Capillary 249 (*) 70 - 99 mg/dL  CBG MONITORING, ED      Result Value Ref Range   Glucose-Capillary 199 (*) 70 - 99 mg/dL   Comment 1 Documented in Chart     Comment 2 Notify RN      Labs Review Labs Reviewed  CBC WITH DIFFERENTIAL - Abnormal; Notable for the following:    RBC 2.98 (*)    Hemoglobin 8.3 (*)    HCT 25.4 (*)    All other components within normal limits  COMPREHENSIVE METABOLIC PANEL - Abnormal; Notable for the following:    Sodium 132 (*)    Glucose, Bld 574 (*)    BUN 28 (*)    Creatinine, Ser 2.37 (*)    Calcium 8.3 (*)    Albumin 1.8 (*)    Total Bilirubin <0.2 (*)    GFR calc non Af Amer 23 (*)    GFR calc Af Amer 27 (*)    All other components within normal limits  URINALYSIS, ROUTINE W REFLEX MICROSCOPIC - Abnormal; Notable for the following:    Glucose, UA >1000 (*)    Hgb  urine dipstick SMALL (*)    Protein, ur >300 (*)    Leukocytes, UA SMALL (*)    All other components within normal limits  URINE RAPID DRUG SCREEN (HOSP PERFORMED) - Abnormal; Notable for the following:    Cocaine POSITIVE (*)    All other components within normal limits  BASIC METABOLIC PANEL - Abnormal; Notable for the following:    Sodium 134 (*)    Potassium 3.5 (*)    CO2 17 (*)    Glucose, Bld 214 (*)    BUN 26 (*)    Creatinine, Ser 2.20 (*)    Calcium 8.3 (*)    GFR calc non Af Amer 25 (*)    GFR calc Af Amer 29 (*)    All other components within normal limits  CBG MONITORING, ED - Abnormal; Notable for the following:    Glucose-Capillary 491 (*)    All other components within normal limits  CBG MONITORING, ED - Abnormal; Notable for the following:    Glucose-Capillary 249 (*)    All other components within normal limits  CBG MONITORING, ED - Abnormal; Notable for the following:    Glucose-Capillary 199 (*)    All other components within normal limits  URINE CULTURE  KETONES, QUALITATIVE  TROPONIN I  URINE MICROSCOPIC-ADD ON  TROPONIN I  COMPREHENSIVE METABOLIC PANEL  CBC  PROTIME-INR  SEDIMENTATION RATE  C-REACTIVE PROTEIN    Imaging Review Dg Chest 2 View  04/11/2014   CLINICAL DATA:  Wound check left foot. Right humerus fracture. Smoker.  EXAM: CHEST  2 VIEW  COMPARISON:  02/22/2014  FINDINGS: The heart size and mediastinal contours are within normal limits. Both lungs are clear. The visualized skeletal structures are unremarkable.  IMPRESSION: No active cardiopulmonary disease.   Electronically Signed   By: Lucienne Capers M.D.   On: 04/11/2014 22:52   Dg  Shoulder Right  04/11/2014   CLINICAL DATA:  Fall, right arm pain  EXAM: RIGHT SHOULDER - 2+ VIEW  COMPARISON:  Previous imaging, please see prior reports from today and 03/07/2014  FINDINGS: There is no evidence of fracture or dislocation involving the proximal humerus. There is no evidence of arthropathy or  other focal bone abnormality. Soft tissues are unremarkable.  IMPRESSION: Negative.   Electronically Signed   By: Conchita Paris M.D.   On: 04/11/2014 18:13   Dg Elbow Complete Right  04/11/2014   CLINICAL DATA:  Right elbow pain, fall  EXAM: RIGHT ELBOW - COMPLETE 3+ VIEW  COMPARISON:  Humerus radiographs same date, elbow radiographs 03/07/2014  FINDINGS: Right supracondylar humeral fracture is reidentified with mild impaction and mild apex volar angulation of the fracture fragments.  IMPRESSION: Right humeral supracondylar fracture reidentified.   Electronically Signed   By: Conchita Paris M.D.   On: 04/11/2014 18:07   Dg Humerus Right  04/11/2014   ADDENDUM REPORT: 04/11/2014 19:16  ADDENDUM: Comparison is now available to previous radiographs 03/07/2014 and the supracondylar fracture is not acute. I discussed these findings with Timarion Agcaoili, the physician's assistant taking care of the patient, on 04/11/2014 at 7:16 p.m.   Electronically Signed   By: Conchita Paris M.D.   On: 04/11/2014 19:16   04/11/2014   CLINICAL DATA:  Fall, right humerus pain  EXAM: RIGHT HUMERUS - 2+ VIEW  COMPARISON:  Elbow radiographs same date, dictated separately  FINDINGS: There is a minimally impacted transverse right humeral supracondylar fracture with mild apex volar angulation of the fracture fragments. Overlying soft tissue swelling is evident.  IMPRESSION: Acute right humeral supracondylar fracture.  Electronically Signed: By: Conchita Paris M.D. On: 04/11/2014 18:05   Dg Foot Complete Left  04/11/2014   CLINICAL DATA:  Left foot pain and bruising  EXAM: LEFT FOOT - COMPLETE 3+ VIEW  COMPARISON:  02/21/2014  FINDINGS: Evidence of pan-transmetatarsal amputation reidentified. Soft tissue defect is noted at the distal aspect of the stump. An osseous fragment is noted adjacent to the medial cuneiform. This is stable. No new osseous destruction or radiopaque foreign body.  IMPRESSION: Stable evidence of transmetatarsal  amputation with soft tissue defect at the distal aspect of the stump.   Electronically Signed   By: Conchita Paris M.D.   On: 04/11/2014 18:09     EKG Interpretation   Date/Time:  Monday April 11 2014 18:12:00 EDT Ventricular Rate:  106 PR Interval:  161 QRS Duration: 89 QT Interval:  353 QTC Calculation: 469 R Axis:   110 Text Interpretation:  Sinus tachycardia has replaced Normal sinus rhythm  Ventricular premature complex Right axis deviation Consider left  ventricular hypertrophy Baseline wander in lead(s) V6 Confirmed by Debby Freiberg [phone]) on 04/11/2014 6:17:26 PM      MDM   Final diagnoses:  Right humeral fracture, with delayed healing, subsequent encounter  Hyperglycemia  UTI (lower urinary tract infection)  Cellulitis of left foot  Cocaine abuse  Medically noncompliant    Medications  carvedilol (COREG) tablet 12.5 mg (not administered)  pregabalin (LYRICA) capsule 75 mg (not administered)  insulin glargine (LANTUS) injection 45 Units (not administered)  levETIRAcetam (KEPPRA) tablet 500 mg (not administered)  oxyCODONE-acetaminophen (PERCOCET/ROXICET) 5-325 MG per tablet 1 tablet (not administered)  cloNIDine (CATAPRES) tablet 0.1 mg (not administered)  DULoxetine (CYMBALTA) DR capsule 60 mg (not administered)  heparin injection 5,000 Units (not administered)  sodium chloride 0.9 % injection 3 mL (not administered)  0.9 %  sodium chloride infusion (not administered)  ondansetron (ZOFRAN) tablet 4 mg (not administered)    Or  ondansetron (ZOFRAN) injection 4 mg (not administered)  acetaminophen (TYLENOL) tablet 650 mg (not administered)    Or  acetaminophen (TYLENOL) suppository 650 mg (not administered)  insulin aspart (novoLOG) injection 0-20 Units (not administered)  insulin aspart (novoLOG) injection 0-5 Units (not administered)  potassium chloride (K-DUR,KLOR-CON) CR tablet 30 mEq (not administered)  ciprofloxacin (CIPRO) IVPB 400 mg (not  administered)  sodium chloride 0.9 % bolus 500 mL (0 mLs Intravenous Stopped 04/11/14 2026)  oxyCODONE-acetaminophen (PERCOCET/ROXICET) 5-325 MG per tablet 1 tablet (1 tablet Oral Given 04/11/14 2103)  insulin aspart (novoLOG) injection 7 Units (7 Units Intravenous Given 04/11/14 2101)  sodium chloride 0.9 % bolus 500 mL (0 mLs Intravenous Stopped 04/11/14 2247)  carvedilol (COREG) tablet 12.5 mg (12.5 mg Oral Given 04/11/14 2246)  cloNIDine (CATAPRES) tablet 0.1 mg (0.1 mg Oral Given 04/11/14 2246)  clindamycin (CLEOCIN) IVPB 600 mg (600 mg Intravenous New Bag/Given 04/12/14 0155)   Filed Vitals:   04/11/14 2105 04/11/14 2324 04/12/14 0124 04/12/14 0253  BP: 212/108 179/100 130/60 156/87  Pulse: 112 101 80 85  Temp: 98.6 F (37 C)  98.1 F (36.7 C) 97.6 F (36.4 C)  TempSrc: Oral  Oral Axillary  Resp: 18 20 20 20   Height:    5\' 4"  (1.626 m)  Weight:    190 lb 14.4 oz (86.592 kg)  SpO2: 100% 100% 100% 100%   EKG noted sinus tachycardia with a heart rate of 106 beats per minute with a right axis deviation. Troponin negative elevation. CBC negative elevated white blood cell count-negative left shift. Hemoglobin 8.3, hematocrit 25.4-when compared to previous labs patient is been as low as 7.6, chronically anemic. CBG 491. CMP noted glucose of 574-anion gap of 15.0 mEq per liter. BUN 20, creatinine 2.37 - patient's creatinine has been as high as 2.63 with BUN as high as 36. Negative elevation in bicarbonate. Ketones negative. Plain film of right humerus noted right humeral supracondylar fracture again identified - stable, no acute findings noted. Right shoulder negative. Left foot plain film stable evidence of trans-metatarsal indication with soft tissue swelling noted at the distal aspect of the stump. Chest x-ray negative for acute cardiac pulmonary disease. Repeat BMP noted low bicarb of 17 changed from 19 from previous labs, glucose 214. Negative anion gap - 15 mEq/L. Second troponin negative elevation.   Appears to be beginnings of cellulitic infection of the left midfoot - maceration tissue noted where amputation occurred. Started on IV antibiotics. Plain films negative for gas - negative findings of gangrene - foul odor noted on exam. Patient poor medical compliance - has HTN and DM without taking medications or monitoring. Doubt DKA. Patient has history of cocaine abuse. Vitals has stabilized while in the ED setting after fluids, insulin, and BP meds. UTI noted - urine culture pending. Patient re-splinted and placed in sling - orthopedics consulted. Patient to be admitted to the hospital for cellulitic infection and poor medical compliance. Patient to be admitted to [facility] to Telemetry. Patient stable for transfer.   Jamse Mead, PA-C 04/12/14 [phone]

## 2014-04-11 NOTE — ED Notes (Signed)
Pt brought in by EMS, pt here for wound re-check and R arm pain.  Pt reports she recently got her cast taken out of her R arm, reports increased pain, not able to bend her R arm.  Pt also reports ?infected L foot.  Pt is DM.  Pt reports not taking her BP meds today.

## 2014-04-11 NOTE — ED Notes (Signed)
Per EMS-Open wound left foot near amputation. 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 No sensation noted on bottom of foot. Right arm has been set recently but still c/o pain. BG 514 mg/dl. VS: BP 180/128 HR 80 RR 12.

## 2014-04-12 ENCOUNTER — Encounter (HOSPITAL_COMMUNITY): Payer: Self-pay

## 2014-04-12 DIAGNOSIS — E1149 Type 2 diabetes mellitus with other diabetic neurological complication: Secondary | ICD-10-CM | POA: Diagnosis present

## 2014-04-12 DIAGNOSIS — L02419 Cutaneous abscess of limb, unspecified: Secondary | ICD-10-CM

## 2014-04-12 DIAGNOSIS — H409 Unspecified glaucoma: Secondary | ICD-10-CM | POA: Diagnosis present

## 2014-04-12 DIAGNOSIS — F259 Schizoaffective disorder, unspecified: Secondary | ICD-10-CM | POA: Diagnosis present

## 2014-04-12 DIAGNOSIS — Z9119 Patient's noncompliance with other medical treatment and regimen: Secondary | ICD-10-CM

## 2014-04-12 DIAGNOSIS — E1142 Type 2 diabetes mellitus with diabetic polyneuropathy: Secondary | ICD-10-CM | POA: Diagnosis present

## 2014-04-12 DIAGNOSIS — I509 Heart failure, unspecified: Secondary | ICD-10-CM | POA: Diagnosis present

## 2014-04-12 DIAGNOSIS — N390 Urinary tract infection, site not specified: Secondary | ICD-10-CM | POA: Diagnosis present

## 2014-04-12 DIAGNOSIS — L03119 Cellulitis of unspecified part of limb: Secondary | ICD-10-CM | POA: Diagnosis not present

## 2014-04-12 DIAGNOSIS — N058 Unspecified nephritic syndrome with other morphologic changes: Secondary | ICD-10-CM | POA: Diagnosis present

## 2014-04-12 DIAGNOSIS — Z91199 Patient's noncompliance with other medical treatment and regimen due to unspecified reason: Secondary | ICD-10-CM | POA: Diagnosis not present

## 2014-04-12 DIAGNOSIS — E860 Dehydration: Secondary | ICD-10-CM | POA: Diagnosis present

## 2014-04-12 DIAGNOSIS — S42309D Unspecified fracture of shaft of humerus, unspecified arm, subsequent encounter for fracture with routine healing: Secondary | ICD-10-CM | POA: Diagnosis not present

## 2014-04-12 DIAGNOSIS — E872 Acidosis, unspecified: Secondary | ICD-10-CM | POA: Diagnosis present

## 2014-04-12 DIAGNOSIS — B182 Chronic viral hepatitis C: Secondary | ICD-10-CM | POA: Diagnosis present

## 2014-04-12 DIAGNOSIS — Z794 Long term (current) use of insulin: Secondary | ICD-10-CM | POA: Diagnosis not present

## 2014-04-12 DIAGNOSIS — G40909 Epilepsy, unspecified, not intractable, without status epilepticus: Secondary | ICD-10-CM | POA: Diagnosis present

## 2014-04-12 DIAGNOSIS — S98919A Complete traumatic amputation of unspecified foot, level unspecified, initial encounter: Secondary | ICD-10-CM | POA: Diagnosis not present

## 2014-04-12 DIAGNOSIS — F319 Bipolar disorder, unspecified: Secondary | ICD-10-CM | POA: Diagnosis not present

## 2014-04-12 DIAGNOSIS — Z79899 Other long term (current) drug therapy: Secondary | ICD-10-CM | POA: Diagnosis not present

## 2014-04-12 DIAGNOSIS — Z8673 Personal history of transient ischemic attack (TIA), and cerebral infarction without residual deficits: Secondary | ICD-10-CM | POA: Diagnosis not present

## 2014-04-12 DIAGNOSIS — I129 Hypertensive chronic kidney disease with stage 1 through stage 4 chronic kidney disease, or unspecified chronic kidney disease: Secondary | ICD-10-CM | POA: Diagnosis present

## 2014-04-12 DIAGNOSIS — E1100 Type 2 diabetes mellitus with hyperosmolarity without nonketotic hyperglycemic-hyperosmolar coma (NKHHC): Secondary | ICD-10-CM | POA: Diagnosis present

## 2014-04-12 DIAGNOSIS — L97509 Non-pressure chronic ulcer of other part of unspecified foot with unspecified severity: Secondary | ICD-10-CM

## 2014-04-12 DIAGNOSIS — I5032 Chronic diastolic (congestive) heart failure: Secondary | ICD-10-CM | POA: Diagnosis present

## 2014-04-12 DIAGNOSIS — D500 Iron deficiency anemia secondary to blood loss (chronic): Secondary | ICD-10-CM | POA: Diagnosis present

## 2014-04-12 DIAGNOSIS — E1129 Type 2 diabetes mellitus with other diabetic kidney complication: Secondary | ICD-10-CM | POA: Diagnosis present

## 2014-04-12 DIAGNOSIS — E1169 Type 2 diabetes mellitus with other specified complication: Secondary | ICD-10-CM

## 2014-04-12 DIAGNOSIS — L97909 Non-pressure chronic ulcer of unspecified part of unspecified lower leg with unspecified severity: Secondary | ICD-10-CM | POA: Diagnosis present

## 2014-04-12 DIAGNOSIS — M7989 Other specified soft tissue disorders: Secondary | ICD-10-CM | POA: Diagnosis present

## 2014-04-12 DIAGNOSIS — F17200 Nicotine dependence, unspecified, uncomplicated: Secondary | ICD-10-CM | POA: Diagnosis present

## 2014-04-12 DIAGNOSIS — N184 Chronic kidney disease, stage 4 (severe): Secondary | ICD-10-CM | POA: Diagnosis present

## 2014-04-12 DIAGNOSIS — E1159 Type 2 diabetes mellitus with other circulatory complications: Secondary | ICD-10-CM

## 2014-04-12 LAB — BASIC METABOLIC PANEL
ANION GAP: 15 (ref 5–15)
BUN: 26 mg/dL — ABNORMAL HIGH (ref 6–23)
CALCIUM: 8.3 mg/dL — AB (ref 8.4–10.5)
CO2: 17 mEq/L — ABNORMAL LOW (ref 19–32)
Chloride: 102 mEq/L (ref 96–112)
Creatinine, Ser: 2.2 mg/dL — ABNORMAL HIGH (ref 0.50–1.10)
GFR, EST AFRICAN AMERICAN: 29 mL/min — AB (ref >90)
GFR, EST NON AFRICAN AMERICAN: 25 mL/min — AB (ref >90)
Glucose, Bld: 214 mg/dL — ABNORMAL HIGH (ref 70–99)
Potassium: 3.5 mEq/L — ABNORMAL LOW (ref 3.7–5.3)
SODIUM: 134 meq/L — AB (ref 137–147)

## 2014-04-12 LAB — CBC
HCT: 25.8 % — ABNORMAL LOW (ref 36.0–46.0)
Hemoglobin: 8.1 g/dL — ABNORMAL LOW (ref 12.0–15.0)
MCH: 27.2 pg (ref 26.0–34.0)
MCHC: 31.4 g/dL (ref 30.0–36.0)
MCV: 86.6 fL (ref 78.0–100.0)
PLATELETS: 228 10*3/uL (ref 150–400)
RBC: 2.98 MIL/uL — ABNORMAL LOW (ref 3.87–5.11)
RDW: 14.9 % (ref 11.5–15.5)
WBC: 3.7 10*3/uL — ABNORMAL LOW (ref 4.0–10.5)

## 2014-04-12 LAB — PRO B NATRIURETIC PEPTIDE: Pro B Natriuretic peptide (BNP): 12801 pg/mL — ABNORMAL HIGH (ref 0–125)

## 2014-04-12 LAB — GLUCOSE, CAPILLARY
GLUCOSE-CAPILLARY: 328 mg/dL — AB (ref 70–99)
GLUCOSE-CAPILLARY: 181 mg/dL — AB (ref 70–99)
GLUCOSE-CAPILLARY: 325 mg/dL — AB (ref 70–99)
Glucose-Capillary: 190 mg/dL — ABNORMAL HIGH (ref 70–99)
Glucose-Capillary: 358 mg/dL — ABNORMAL HIGH (ref 70–99)
Glucose-Capillary: 387 mg/dL — ABNORMAL HIGH (ref 70–99)

## 2014-04-12 LAB — COMPREHENSIVE METABOLIC PANEL
ALK PHOS: 81 U/L (ref 39–117)
ALT: 21 U/L (ref 0–35)
AST: 20 U/L (ref 0–37)
Albumin: 1.8 g/dL — ABNORMAL LOW (ref 3.5–5.2)
Anion gap: 16 — ABNORMAL HIGH (ref 5–15)
BUN: 26 mg/dL — ABNORMAL HIGH (ref 6–23)
CHLORIDE: 99 meq/L (ref 96–112)
CO2: 18 meq/L — AB (ref 19–32)
Calcium: 8.3 mg/dL — ABNORMAL LOW (ref 8.4–10.5)
Creatinine, Ser: 2.43 mg/dL — ABNORMAL HIGH (ref 0.50–1.10)
GFR, EST AFRICAN AMERICAN: 26 mL/min — AB (ref >90)
GFR, EST NON AFRICAN AMERICAN: 23 mL/min — AB (ref >90)
GLUCOSE: 419 mg/dL — AB (ref 70–99)
POTASSIUM: 3.3 meq/L — AB (ref 3.7–5.3)
SODIUM: 133 meq/L — AB (ref 137–147)
Total Protein: 6.7 g/dL (ref 6.0–8.3)

## 2014-04-12 LAB — SEDIMENTATION RATE: Sed Rate: 72 mm/hr — ABNORMAL HIGH (ref 0–22)

## 2014-04-12 LAB — MRSA PCR SCREENING: MRSA by PCR: NEGATIVE

## 2014-04-12 LAB — C-REACTIVE PROTEIN

## 2014-04-12 LAB — PROTIME-INR
INR: 1.06 (ref 0.00–1.49)
PROTHROMBIN TIME: 13.8 s (ref 11.6–15.2)

## 2014-04-12 LAB — TROPONIN I: Troponin I: <0.3 ng/mL (ref <0.30)

## 2014-04-12 MED ORDER — INSULIN ASPART 100 UNIT/ML ~~LOC~~ SOLN
0.0000 [IU] | SUBCUTANEOUS | Status: DC
  Administered 2014-04-12 – 2014-04-13 (×3): 4 [IU] via SUBCUTANEOUS
  Administered 2014-04-13: 7 [IU] via SUBCUTANEOUS
  Administered 2014-04-13: 20 [IU] via SUBCUTANEOUS
  Administered 2014-04-13: 4 [IU] via SUBCUTANEOUS
  Administered 2014-04-13: 7 [IU] via SUBCUTANEOUS
  Administered 2014-04-14: 3 [IU] via SUBCUTANEOUS
  Administered 2014-04-14 (×3): 4 [IU] via SUBCUTANEOUS

## 2014-04-12 MED ORDER — INSULIN ASPART 100 UNIT/ML ~~LOC~~ SOLN
15.0000 [IU] | Freq: Once | SUBCUTANEOUS | Status: AC
  Administered 2014-04-12: 15 [IU] via SUBCUTANEOUS

## 2014-04-12 MED ORDER — CLINDAMYCIN PHOSPHATE 600 MG/50ML IV SOLN
600.0000 mg | Freq: Once | INTRAVENOUS | Status: AC
  Administered 2014-04-12: 600 mg via INTRAVENOUS
  Filled 2014-04-12: qty 50

## 2014-04-12 MED ORDER — ACETAMINOPHEN 325 MG PO TABS: 650.0000 mg | ORAL_TABLET | Freq: Four times a day (QID) | ORAL | Status: DC | PRN

## 2014-04-12 MED ORDER — INSULIN ASPART 100 UNIT/ML ~~LOC~~ SOLN
10.0000 [IU] | Freq: Once | SUBCUTANEOUS | Status: AC
  Administered 2014-04-12: 10 [IU] via SUBCUTANEOUS

## 2014-04-12 MED ORDER — DULOXETINE HCL 60 MG PO CPEP
60.0000 mg | ORAL_CAPSULE | Freq: Every day | ORAL | Status: DC
  Administered 2014-04-12 – 2014-04-14 (×3): 60 mg via ORAL
  Filled 2014-04-12 (×3): qty 1

## 2014-04-12 MED ORDER — SODIUM CHLORIDE 0.9 % IV SOLN
INTRAVENOUS | Status: DC
  Administered 2014-04-12: 04:00:00 via INTRAVENOUS

## 2014-04-12 MED ORDER — INSULIN ASPART 100 UNIT/ML ~~LOC~~ SOLN: 0.0000 [IU] | Freq: Every day | SUBCUTANEOUS | Status: DC

## 2014-04-12 MED ORDER — INSULIN GLARGINE 100 UNIT/ML ~~LOC~~ SOLN
45.0000 [IU] | Freq: Every day | SUBCUTANEOUS | Status: DC
  Administered 2014-04-12 – 2014-04-13 (×2): 45 [IU] via SUBCUTANEOUS
  Filled 2014-04-12 (×3): qty 0.45

## 2014-04-12 MED ORDER — CARVEDILOL 12.5 MG PO TABS
12.5000 mg | ORAL_TABLET | Freq: Two times a day (BID) | ORAL | Status: DC
  Administered 2014-04-12 – 2014-04-14 (×6): 12.5 mg via ORAL
  Filled 2014-04-12 (×7): qty 1

## 2014-04-12 MED ORDER — DEXTROSE 5 % IV SOLN
2.0000 g | INTRAVENOUS | Status: DC
  Administered 2014-04-12 – 2014-04-13 (×2): 2 g via INTRAVENOUS
  Filled 2014-04-12 (×2): qty 2

## 2014-04-12 MED ORDER — ONDANSETRON HCL 4 MG PO TABS: 4.0000 mg | ORAL_TABLET | Freq: Four times a day (QID) | ORAL | Status: DC | PRN

## 2014-04-12 MED ORDER — OXYCODONE-ACETAMINOPHEN 5-325 MG PO TABS
1.0000 | ORAL_TABLET | Freq: Four times a day (QID) | ORAL | Status: DC | PRN
  Administered 2014-04-12 – 2014-04-13 (×2): 1 via ORAL
  Filled 2014-04-12 (×2): qty 1

## 2014-04-12 MED ORDER — INSULIN ASPART 100 UNIT/ML ~~LOC~~ SOLN
0.0000 [IU] | Freq: Three times a day (TID) | SUBCUTANEOUS | Status: DC
  Administered 2014-04-12: 4 [IU] via SUBCUTANEOUS
  Administered 2014-04-12: 15 [IU] via SUBCUTANEOUS

## 2014-04-12 MED ORDER — LEVETIRACETAM 500 MG PO TABS
500.0000 mg | ORAL_TABLET | Freq: Two times a day (BID) | ORAL | Status: DC
  Administered 2014-04-12 – 2014-04-14 (×6): 500 mg via ORAL
  Filled 2014-04-12 (×7): qty 1

## 2014-04-12 MED ORDER — CLONIDINE HCL 0.1 MG PO TABS
0.1000 mg | ORAL_TABLET | Freq: Every day | ORAL | Status: DC
  Administered 2014-04-12 – 2014-04-14 (×3): 0.1 mg via ORAL
  Filled 2014-04-12 (×3): qty 1

## 2014-04-12 MED ORDER — POTASSIUM CHLORIDE CRYS ER 20 MEQ PO TBCR
30.0000 meq | EXTENDED_RELEASE_TABLET | Freq: Once | ORAL | Status: AC
  Administered 2014-04-12: 30 meq via ORAL
  Filled 2014-04-12: qty 1

## 2014-04-12 MED ORDER — PREGABALIN 75 MG PO CAPS
75.0000 mg | ORAL_CAPSULE | Freq: Two times a day (BID) | ORAL | Status: DC
  Administered 2014-04-12 – 2014-04-14 (×6): 75 mg via ORAL
  Filled 2014-04-12 (×6): qty 1

## 2014-04-12 MED ORDER — CIPROFLOXACIN IN D5W 400 MG/200ML IV SOLN
400.0000 mg | Freq: Two times a day (BID) | INTRAVENOUS | Status: DC
  Administered 2014-04-12: 400 mg via INTRAVENOUS
  Filled 2014-04-12 (×2): qty 200

## 2014-04-12 MED ORDER — ACETAMINOPHEN 650 MG RE SUPP: 650.0000 mg | Freq: Four times a day (QID) | RECTAL | Status: DC | PRN

## 2014-04-12 MED ORDER — VANCOMYCIN HCL IN DEXTROSE 1-5 GM/200ML-% IV SOLN
1000.0000 mg | INTRAVENOUS | Status: DC
  Administered 2014-04-12 – 2014-04-14 (×3): 1000 mg via INTRAVENOUS
  Filled 2014-04-12 (×3): qty 200

## 2014-04-12 MED ORDER — HEPARIN SODIUM (PORCINE) 5000 UNIT/ML IJ SOLN
5000.0000 [IU] | Freq: Three times a day (TID) | INTRAMUSCULAR | Status: DC
  Administered 2014-04-12 – 2014-04-13 (×5): 5000 [IU] via SUBCUTANEOUS
  Filled 2014-04-12 (×7): qty 1

## 2014-04-12 MED ORDER — SODIUM CHLORIDE 0.9 % IJ SOLN
3.0000 mL | Freq: Two times a day (BID) | INTRAMUSCULAR | Status: DC
  Administered 2014-04-12 – 2014-04-14 (×6): 3 mL via INTRAVENOUS

## 2014-04-12 MED ORDER — ONDANSETRON HCL 4 MG/2ML IJ SOLN
4.0000 mg | Freq: Four times a day (QID) | INTRAMUSCULAR | Status: DC | PRN
Start: 2014-04-12 — End: 2014-04-14

## 2014-04-12 NOTE — Progress Notes (Signed)
   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 Patient admitted to the hospital service and started on IV antibiotics. Her sugars were also noted to be markedly elevated. Patient states that she had run out of her insulin and do not feel that for several weeks but then had filled it recently and had taken her insulin once the day before coming in for admission.  Exam: CV: Regular rate and rhythm, [id number], soft 2/6 systolic ejection murmur Lungs: Clear to auscultation bilaterally Abd: Soft, nontender, nondistended, positive bowel sounds Ext: No clubbing or cyanosis, trace edema. Left foot stump bandaged  Present on Admission:  . Cellulitis and abscess of leg/. Diabetic foot ulcer:  on broad-spectrum antibiotics. Orthopedic surgery, [facility], to see  . UTI (lower urinary tract infection): On further examination of urinalysis, unremarkable. Already on broad-spectrum antibiotics for infection.  Marland Kitchen Uncontrolled type 2 diabetes mellitus with hyperosmolar nonketotic hyperglycemia: Given Lantus last night at home dose. She was elevated this morning, but likely once this dose kicks in, by this afternoon should see better results  . Schizoaffective disorder, unspecified condition . Stage 4 chronic kidney disease due to type 2 diabetes mellitus: In review previous records, looks to be at baseline   . Seizure disorder: Stable. Continued on Keppra.  . Hypertension, accelerated: Came in with systolic blood pressures in the 200s. Suspect in part from infection, in part possibly from noncompliance. Restart on home medications, blood pressure slowly improving.    Chronic diastolic heart failure:  Noted on echocardiogram, 10/14. We'll check BNP plus daily weights.   Medical noncompliance: This is been a long-standing issue with this patient. Even this time again, she has not been taking her diabetic medications. I suspect likely she has not had very much followup in wound care.  . Normocytic anemia: At baseline. Felt to be secondary to chronic renal disease.  Marland Kitchen Hep C w/o coma, chronic: Stable medical issue

## 2014-04-12 NOTE — Consult Note (Signed)
Reason for Consult: Left foot infxn Referring Physician: dr Docia Barrier is an 48 y.o. female.  HPI: h/o l tma 10/14 dr duda now with several day h/o foot drainage - no f/c - admitted for volume overload issues  Past Medical History  Diagnosis Date  . Neuropathy   . Glaucoma   . Bipolar 1 disorder   . CHF (congestive heart failure)   . Refusal of blood transfusions as patient is Jehovah's Witness   . Hypertension   . Shortness of breath   . Diabetes mellitus     INSULIN DEPENDENT  . TIA (transient ischemic attack)   . Neuromuscular disorder     DIABETIC NEUROPATHY  . Chronic kidney disease   . Hepatitis C virus   . Seizure     diagnosed 2014  . MRSA infection   . Bipolar disorder, unspecified 02/06/2014  . Schizoaffective disorder, unspecified condition 02/08/2014    Past Surgical History  Procedure Laterality Date  . Abdominal hysterectomy    . Amputation Left 05/21/2013    Procedure: Left Midfoot AMPUTATION;  Surgeon: Newt Minion, MD;  Location: [facility];  Service: Orthopedics;  Laterality: Left;  Left Midfoot Amputation  . Tubal ligation    . Colonoscopy with propofol N/A 06/22/2013    Procedure: COLONOSCOPY WITH PROPOFOL;  Surgeon: Jeryl Columbia, MD;  Location: [facility];  Service: Endoscopy;  Laterality: N/A;    Family History  Problem Relation Age of Onset  . Hypertension Mother   . Hypertension Father     Social History:  reports that she has been smoking Cigarettes.  She has a 7.5 pack-year smoking history. She has never used smokeless tobacco. She reports that she drinks alcohol. She reports that she does not use illicit drugs.  Allergies:  Allergies  Allergen Reactions  . Gabapentin Itching and Swelling  . Saphris [Asenapine] Swelling    Swelling of the face  . Tramadol Swelling    Causes legs to swell  . Penicillins Hives    Has tolerated Cefepime and Ceftriaxone.    Medications: I have reviewed the patient's current  medications.  Results for orders placed during the hospital encounter of 04/11/14 (from the past 48 hour(s))  CBG MONITORING, ED     Status: Abnormal   Collection Time    04/11/14  4:38 PM      Result Value Ref Range   Glucose-Capillary 491 (*) 70 - 99 mg/dL  CBC WITH DIFFERENTIAL     Status: Abnormal   Collection Time    04/11/14  4:58 PM      Result Value Ref Range   WBC 4.3  4.0 - 10.5 K/uL   RBC 2.98 (*) 3.87 - 5.11 MIL/uL   Hemoglobin 8.3 (*) 12.0 - 15.0 g/dL   HCT 25.4 (*) 36.0 - 46.0 %   MCV 85.2  78.0 - 100.0 fL   MCH 27.9  26.0 - 34.0 pg   MCHC 32.7  30.0 - 36.0 g/dL   RDW 14.9  11.5 - 15.5 %   Platelets 249  150 - 400 K/uL   Neutrophils Relative % 65  43 - 77 %   Neutro Abs 2.8  1.7 - 7.7 K/uL   Lymphocytes Relative 27  12 - 46 %   Lymphs Abs 1.2  0.7 - 4.0 K/uL   Monocytes Relative 6  3 - 12 %   Monocytes Absolute 0.2  0.1 - 1.0 K/uL   Eosinophils Relative 2  0 -  5 %   Eosinophils Absolute 0.1  0.0 - 0.7 K/uL   Basophils Relative 0  0 - 1 %   Basophils Absolute 0.0  0.0 - 0.1 K/uL  COMPREHENSIVE METABOLIC PANEL     Status: Abnormal   Collection Time    04/11/14  4:58 PM      Result Value Ref Range   Sodium 132 (*) 137 - 147 mEq/L   Potassium 3.8  3.7 - 5.3 mEq/L   Chloride 98  96 - 112 mEq/L   CO2 19  19 - 32 mEq/L   Glucose, Bld 574 (*) 70 - 99 mg/dL   Comment: CRITICAL RESULT CALLED TO, READ BACK BY AND VERIFIED WITH:     HASZ F RN 04/11/2014 1800 BY BOVELL,T.   BUN 28 (*) 6 - 23 mg/dL   Creatinine, Ser 2.37 (*) 0.50 - 1.10 mg/dL   Calcium 8.3 (*) 8.4 - 10.5 mg/dL   Total Protein 6.7  6.0 - 8.3 g/dL   Albumin 1.8 (*) 3.5 - 5.2 g/dL   AST 19  0 - 37 U/L   ALT 22  0 - 35 U/L   Alkaline Phosphatase 91  39 - 117 U/L   Total Bilirubin <0.2 (*) 0.3 - 1.2 mg/dL   GFR calc non Af Amer 23 (*) >90 mL/min   GFR calc Af Amer 27 (*) >90 mL/min   Comment: (NOTE)     The eGFR has been calculated using the CKD EPI equation.     This calculation has not been validated  in all clinical situations.     eGFR's persistently <90 mL/min signify possible Chronic Kidney     Disease.   Anion gap 15  5 - 15  KETONES, QUALITATIVE     Status: None   Collection Time    04/11/14  4:58 PM      Result Value Ref Range   Acetone, Bld NEGATIVE  NEGATIVE  TROPONIN I     Status: None   Collection Time    04/11/14  4:58 PM      Result Value Ref Range   Troponin I <0.30  <0.30 ng/mL   Comment:            Due to the release kinetics of cTnI,     a negative result within the first hours     of the onset of symptoms does not rule out     myocardial infarction with certainty.     If myocardial infarction is still suspected,     repeat the test at appropriate intervals.  URINALYSIS, ROUTINE W REFLEX MICROSCOPIC     Status: Abnormal   Collection Time    04/11/14  6:32 PM      Result Value Ref Range   Color, Urine YELLOW  YELLOW   APPearance CLEAR  CLEAR   Specific Gravity, Urine 1.015  1.005 - 1.030   pH 6.5  5.0 - 8.0   Glucose, UA >1000 (*) NEGATIVE mg/dL   Hgb urine dipstick SMALL (*) NEGATIVE   Bilirubin Urine NEGATIVE  NEGATIVE   Ketones, ur NEGATIVE  NEGATIVE mg/dL   Protein, ur >300 (*) NEGATIVE mg/dL   Urobilinogen, UA 0.2  0.0 - 1.0 mg/dL   Nitrite NEGATIVE  NEGATIVE   Leukocytes, UA SMALL (*) NEGATIVE  URINE RAPID DRUG SCREEN (HOSP PERFORMED)     Status: Abnormal   Collection Time    04/11/14  6:32 PM      Result Value Ref  Range   Opiates NONE DETECTED  NONE DETECTED   Cocaine POSITIVE (*) NONE DETECTED   Benzodiazepines NONE DETECTED  NONE DETECTED   Amphetamines NONE DETECTED  NONE DETECTED   Tetrahydrocannabinol NONE DETECTED  NONE DETECTED   Barbiturates NONE DETECTED  NONE DETECTED   Comment:            DRUG SCREEN FOR MEDICAL PURPOSES     ONLY.  IF CONFIRMATION IS NEEDED     FOR ANY PURPOSE, NOTIFY LAB     WITHIN 5 DAYS.                LOWEST DETECTABLE LIMITS     FOR URINE DRUG SCREEN     Drug Class       Cutoff (ng/mL)     Amphetamine       1000     Barbiturate      200     Benzodiazepine   063     Tricyclics       016     Opiates          300     Cocaine          300     THC              50  URINE MICROSCOPIC-ADD ON     Status: None   Collection Time    04/11/14  6:32 PM      Result Value Ref Range   Squamous Epithelial / LPF RARE  RARE   WBC, UA 7-10  <3 WBC/hpf   RBC / HPF 3-6  <3 RBC/hpf  CBG MONITORING, ED     Status: Abnormal   Collection Time    04/11/14  9:53 PM      Result Value Ref Range   Glucose-Capillary 249 (*) 70 - 99 mg/dL  CBG MONITORING, ED     Status: Abnormal   Collection Time    04/11/14 11:27 PM      Result Value Ref Range   Glucose-Capillary 199 (*) 70 - 99 mg/dL   Comment 1 Documented in Chart     Comment 2 Notify RN    BASIC METABOLIC PANEL     Status: Abnormal   Collection Time    04/11/14 11:44 PM      Result Value Ref Range   Sodium 134 (*) 137 - 147 mEq/L   Potassium 3.5 (*) 3.7 - 5.3 mEq/L   Chloride 102  96 - 112 mEq/L   CO2 17 (*) 19 - 32 mEq/L   Glucose, Bld 214 (*) 70 - 99 mg/dL   BUN 26 (*) 6 - 23 mg/dL   Creatinine, Ser 2.20 (*) 0.50 - 1.10 mg/dL   Calcium 8.3 (*) 8.4 - 10.5 mg/dL   GFR calc non Af Amer 25 (*) >90 mL/min   GFR calc Af Amer 29 (*) >90 mL/min   Comment: (NOTE)     The eGFR has been calculated using the CKD EPI equation.     This calculation has not been validated in all clinical situations.     eGFR's persistently <90 mL/min signify possible Chronic Kidney     Disease.   Anion gap 15  5 - 15  TROPONIN I     Status: None   Collection Time    04/11/14 11:44 PM      Result Value Ref Range   Troponin I <0.30  <0.30 ng/mL   Comment:  Due to the release kinetics of cTnI,     a negative result within the first hours     of the onset of symptoms does not rule out     myocardial infarction with certainty.     If myocardial infarction is still suspected,     repeat the test at appropriate intervals.  MRSA PCR SCREENING     Status: None    Collection Time    04/12/14  3:57 AM      Result Value Ref Range   MRSA by PCR NEGATIVE  NEGATIVE   Comment:            The GeneXpert MRSA Assay (FDA     approved for NASAL specimens     only), is one component of a     comprehensive MRSA colonization     surveillance program. It is not     intended to diagnose MRSA     infection nor to guide or     monitor treatment for     MRSA infections.  GLUCOSE, CAPILLARY     Status: Abnormal   Collection Time    04/12/14  4:11 AM      Result Value Ref Range   Glucose-Capillary 387 (*) 70 - 99 mg/dL  COMPREHENSIVE METABOLIC PANEL     Status: Abnormal   Collection Time    04/12/14  5:15 AM      Result Value Ref Range   Sodium 133 (*) 137 - 147 mEq/L   Potassium 3.3 (*) 3.7 - 5.3 mEq/L   Chloride 99  96 - 112 mEq/L   CO2 18 (*) 19 - 32 mEq/L   Glucose, Bld 419 (*) 70 - 99 mg/dL   BUN 26 (*) 6 - 23 mg/dL   Creatinine, Ser 3.57 (*) 0.50 - 1.10 mg/dL   Calcium 8.3 (*) 8.4 - 10.5 mg/dL   Total Protein 6.7  6.0 - 8.3 g/dL   Albumin 1.8 (*) 3.5 - 5.2 g/dL   AST 20  0 - 37 U/L   ALT 21  0 - 35 U/L   Alkaline Phosphatase 81  39 - 117 U/L   Total Bilirubin <0.2 (*) 0.3 - 1.2 mg/dL   GFR calc non Af Amer 23 (*) >90 mL/min   GFR calc Af Amer 26 (*) >90 mL/min   Comment: (NOTE)     The eGFR has been calculated using the CKD EPI equation.     This calculation has not been validated in all clinical situations.     eGFR's persistently <90 mL/min signify possible Chronic Kidney     Disease.   Anion gap 16 (*) 5 - 15  CBC     Status: Abnormal   Collection Time    04/12/14  5:15 AM      Result Value Ref Range   WBC 3.7 (*) 4.0 - 10.5 K/uL   RBC 2.98 (*) 3.87 - 5.11 MIL/uL   Hemoglobin 8.1 (*) 12.0 - 15.0 g/dL   HCT 24.2 (*) 42.4 - 44.5 %   MCV 86.6  78.0 - 100.0 fL   MCH 27.2  26.0 - 34.0 pg   MCHC 31.4  30.0 - 36.0 g/dL   RDW 36.7  61.1 - 41.1 %   Platelets 228  150 - 400 K/uL  PROTIME-INR     Status: None   Collection Time    04/12/14   5:15 AM      Result Value Ref Range   Prothrombin  Time 13.8  11.6 - 15.2 seconds   INR 1.06  0.00 - 1.49  SEDIMENTATION RATE     Status: Abnormal   Collection Time    04/12/14  5:15 AM      Result Value Ref Range   Sed Rate 72 (*) 0 - 22 mm/hr  C-REACTIVE PROTEIN     Status: Abnormal   Collection Time    04/12/14  5:15 AM      Result Value Ref Range   CRP <0.5 (*) <0.60 mg/dL   Comment: Performed at [facility]     Status: Abnormal   Collection Time    04/12/14  5:20 AM      Result Value Ref Range   Pro B Natriuretic peptide (BNP) 12801.0 (*) 0 - 125 pg/mL  GLUCOSE, CAPILLARY     Status: Abnormal   Collection Time    04/12/14  7:24 AM      Result Value Ref Range   Glucose-Capillary 328 (*) 70 - 99 mg/dL   Comment 1 Documented in Chart     Comment 2 Notify RN    GLUCOSE, CAPILLARY     Status: Abnormal   Collection Time    04/12/14 11:27 AM      Result Value Ref Range   Glucose-Capillary 181 (*) 70 - 99 mg/dL  GLUCOSE, CAPILLARY     Status: Abnormal   Collection Time    04/12/14  4:22 PM      Result Value Ref Range   Glucose-Capillary 325 (*) 70 - 99 mg/dL    Dg Chest 2 View  04/11/2014   CLINICAL DATA:  Wound check left foot. Right humerus fracture. Smoker.  EXAM: CHEST  2 VIEW  COMPARISON:  02/22/2014  FINDINGS: The heart size and mediastinal contours are within normal limits. Both lungs are clear. The visualized skeletal structures are unremarkable.  IMPRESSION: No active cardiopulmonary disease.   Electronically Signed   By: Lucienne Capers M.D.   On: 04/11/2014 22:52   Dg Shoulder Right  04/11/2014   CLINICAL DATA:  Fall, right arm pain  EXAM: RIGHT SHOULDER - 2+ VIEW  COMPARISON:  Previous imaging, please see prior reports from today and 03/07/2014  FINDINGS: There is no evidence of fracture or dislocation involving the proximal humerus. There is no evidence of arthropathy or other focal bone abnormality. Soft tissues are unremarkable.   IMPRESSION: Negative.   Electronically Signed   By: Conchita Paris M.D.   On: 04/11/2014 18:13   Dg Elbow Complete Right  04/11/2014   CLINICAL DATA:  Right elbow pain, fall  EXAM: RIGHT ELBOW - COMPLETE 3+ VIEW  COMPARISON:  Humerus radiographs same date, elbow radiographs 03/07/2014  FINDINGS: Right supracondylar humeral fracture is reidentified with mild impaction and mild apex volar angulation of the fracture fragments.  IMPRESSION: Right humeral supracondylar fracture reidentified.   Electronically Signed   By: Conchita Paris M.D.   On: 04/11/2014 18:07   Dg Humerus Right  04/11/2014   ADDENDUM REPORT: 04/11/2014 19:16  ADDENDUM: Comparison is now available to previous radiographs 03/07/2014 and the supracondylar fracture is not acute. I discussed these findings with Marissa, the physician's assistant taking care of the patient, on 04/11/2014 at 7:16 p.m.   Electronically Signed   By: Conchita Paris M.D.   On: 04/11/2014 19:16   04/11/2014   CLINICAL DATA:  Fall, right humerus pain  EXAM: RIGHT HUMERUS - 2+ VIEW  COMPARISON:  Elbow radiographs  same date, dictated separately  FINDINGS: There is a minimally impacted transverse right humeral supracondylar fracture with mild apex volar angulation of the fracture fragments. Overlying soft tissue swelling is evident.  IMPRESSION: Acute right humeral supracondylar fracture.  Electronically Signed: By: Conchita Paris M.D. On: 04/11/2014 18:05   Dg Foot Complete Left  04/11/2014   CLINICAL DATA:  Left foot pain and bruising  EXAM: LEFT FOOT - COMPLETE 3+ VIEW  COMPARISON:  02/21/2014  FINDINGS: Evidence of pan-transmetatarsal amputation reidentified. Soft tissue defect is noted at the distal aspect of the stump. An osseous fragment is noted adjacent to the medial cuneiform. This is stable. No new osseous destruction or radiopaque foreign body.  IMPRESSION: Stable evidence of transmetatarsal amputation with soft tissue defect at the distal aspect of the stump.    Electronically Signed   By: Conchita Paris M.D.   On: 04/11/2014 18:09    Review of Systems  Constitutional: Negative.   HENT: Negative.   Eyes: Negative.   Respiratory: Negative.   Musculoskeletal: Positive for joint pain.  Skin: Negative.   Neurological: Negative.   Psychiatric/Behavioral: Negative.    Blood pressure 133/77, pulse 91, temperature 97.8 F (36.6 C), temperature source Oral, resp. rate 18, height $RemoveBe'5\' 4"'BAihusQMm$  (1.626 m), weight 86.592 kg (190 lb 14.4 oz), SpO2 100.00%. Physical Exam  Constitutional: She appears well-developed.  HENT:  Head: Normocephalic.  Eyes: Pupils are equal, round, and reactive to light.  Neck: Normal range of motion.  Cardiovascular: Normal rate.   Respiratory: Effort normal.  Neurological: She is alert.  Skin: Skin is warm.  Psychiatric: She has a normal mood and affect.  left foot perfused with decreased sensation - tma incision ok - delaminated blister plantar 5 x 5 cm - no active drainage or purulent dc  Assessment/Plan: No active infection by exam - rec silvadene dressings and dry dressing with Dr Sharol Given f/u next week  Terriana Barreras SCOTT 04/12/2014, 6:08 PM

## 2014-04-12 NOTE — Progress Notes (Signed)
CARE MANAGEMENT NOTE 04/12/2014  Patient:  TRENISHA, RUNCK   Account Number:  [id number]  Date Initiated:  04/12/2014  Documentation initiated by:  Gabriel Earing  Subjective/Objective Assessment:   Pt admitted with Cellulitisf     Action/Plan:   from home   Anticipated DC Date:  04/14/2014   Anticipated DC Plan:  [place]  CM consult      Choice offered to / List presented to:             Status of service:  In process, will continue to follow Medicare Important Message given?   (If response is "NO", the following Medicare IM given date fields will be blank) Date Medicare IM given:   Medicare IM given by:   Date Additional Medicare IM given:   Additional Medicare IM given by:    Discharge Disposition:    Per UR Regulation:  Reviewed for med. necessity/level of care/duration of stay  If discussed at [place] of Stay Meetings, dates discussed:    Comments:  04/12/14 MMcGibboney, RN, BSN Spoke with pt concerning her not able to afford her medications. Pt states that she is able to afford her medications. She would like to go to [facility] related to mental problems and drug abuse. CSW and Attending made aware.

## 2014-04-12 NOTE — Progress Notes (Signed)
Utilization review completed.  

## 2014-04-12 NOTE — Progress Notes (Signed)
ANTIBIOTIC CONSULT NOTE - INITIAL  Pharmacy Consult for Cipro/Vancomycin Indication: Cellulitis of Left mid foot  Allergies  Allergen Reactions  . Gabapentin Itching and Swelling  . Saphris [Asenapine] Swelling    Swelling of the face  . Tramadol Swelling    Causes legs to swell  . Penicillins Hives    Patient Measurements: Height: 5\' 4"  (162.6 cm) Weight: 190 lb 14.4 oz (86.592 kg) IBW/kg (Calculated) : 54.7   Vital Signs: Temp: 97.6 F (36.4 C) (09/08 0253) Temp src: Axillary (09/08 0253) BP: 156/87 mmHg (09/08 0253) Pulse Rate: 85 (09/08 0253) Intake/Output from previous day: 09/07 0701 - 09/08 0700 In: 500 [IV Piggyback:500] Out: -  Intake/Output from this shift: Total I/O In: 500 [IV Piggyback:500] Out: -   Labs:  Recent Labs  04/11/14 1658 04/11/14 2344  WBC 4.3  --   HGB 8.3*  --   PLT 249  --   CREATININE 2.37* 2.20*   Estimated Creatinine Clearance: 33.7 ml/min (by C-G formula based on Cr of 2.2). No results found for this basename: VANCOTROUGH, VANCOPEAK, VANCORANDOM, GENTTROUGH, GENTPEAK, GENTRANDOM, TOBRATROUGH, TOBRAPEAK, TOBRARND, AMIKACINPEAK, AMIKACINTROU, AMIKACIN,  in the last 72 hours   Microbiology: No results found for this or any previous visit (from the past 720 hour(s)).  Medical History: Past Medical History  Diagnosis Date  . Neuropathy   . Glaucoma   . Bipolar 1 disorder   . CHF (congestive heart failure)   . Refusal of blood transfusions as patient is Jehovah's Witness   . Hypertension   . Shortness of breath   . Diabetes mellitus     INSULIN DEPENDENT  . TIA (transient ischemic attack)   . Neuromuscular disorder     DIABETIC NEUROPATHY  . Chronic kidney disease   . Hepatitis C virus   . Seizure     diagnosed 2014  . MRSA infection   . Bipolar disorder, unspecified 02/06/2014  . Schizoaffective disorder, unspecified condition 02/08/2014    Medications:  Scheduled:  . carvedilol  12.5 mg Oral BID  . ciprofloxacin   400 mg Intravenous Q12H  . cloNIDine  0.1 mg Oral Daily  . DULoxetine  60 mg Oral Daily  . heparin  5,000 Units Subcutaneous 3 times per day  . insulin aspart  0-20 Units Subcutaneous TID WC  . insulin aspart  0-5 Units Subcutaneous QHS  . insulin glargine  45 Units Subcutaneous QHS  . levETIRAcetam  500 mg Oral BID  . potassium chloride  30 mEq Oral Once  . pregabalin  75 mg Oral BID  . sodium chloride  3 mL Intravenous Q12H  . vancomycin  1,000 mg Intravenous Q24H   Infusions:  . sodium chloride     Assessment: 37 yoF with recent humeral fracture c/o right arm pain and found to have cellulitis of left mid foot.  Cipro and Vancomycin per Rx for Cellulitis.   Goal of Therapy:  Vancomycin trough level 10-15 mcg/ml Negative for gangrene- so lower goal  Plan:   Cipro 400mg  IV q12h  Vancomycin 1Gm q24h.  F/U SCr/level/cultures  Lawana Pai R 04/12/2014,3:39 AM

## 2014-04-12 NOTE — H&P (Addendum)
[facility] History and Physical  Patient: [name]  [id number]  DOB: 04/01/1966  DOS: the patient was seen and examined on 04/12/2014 PCP: [name], MD  Chief Complaint: Left leg ulcer  HPI: [name] is a 48 y.o. female with Past medical history of diabetes mellitus uncontrolled, neuropathy, chronic kidney disease: CHF, hypertension, noncompliance, foot amputation. The patient presented with an ulcer on her left leg. She mentions that she has a chronic ulcer on the left leg but since last few days it has been draining puslike material and had a pocket created. She'll pocket 1 the pus was draining out and it also that she came to the hospital. She denies any fever but had some chills but she complains of some generalized weakness tiredness and fatigue. She also had a right shoulder pain which has been present since her fall in August 15 and has been following with the orthopedic for a fracture which is being management. She mentions she takes her diabetes seizure and blood pressure regularly, but since last few days she has not been able to take them.  The patient is coming from home And at her baseline independent for most of her ADL.  Review of Systems: as mentioned in the history of present illness.  A Comprehensive review of the other systems is negative.  Past Medical History  Diagnosis Date  . Neuropathy   . Glaucoma   . Bipolar 1 disorder   . CHF (congestive heart failure)   . Refusal of blood transfusions as patient is Jehovah's Witness   . Hypertension   . Shortness of breath   . Diabetes mellitus     INSULIN DEPENDENT  . TIA (transient ischemic attack)   . Neuromuscular disorder     DIABETIC NEUROPATHY  . Chronic kidney disease   . Hepatitis C virus   . Seizure     diagnosed 2014  . MRSA infection   . Bipolar disorder, unspecified 02/06/2014  . Schizoaffective disorder, unspecified condition 02/08/2014   Past Surgical History  Procedure  Laterality Date  . Abdominal hysterectomy    . Amputation Left 05/21/2013    Procedure: Left Midfoot AMPUTATION;  Surgeon: Newt Minion, MD;  Location: [facility];  Service: Orthopedics;  Laterality: Left;  Left Midfoot Amputation  . Tubal ligation    . Colonoscopy with propofol N/A 06/22/2013    Procedure: COLONOSCOPY WITH PROPOFOL;  Surgeon: Jeryl Columbia, MD;  Location: [facility];  Service: Endoscopy;  Laterality: N/A;   Social History:  reports that she has been smoking Cigarettes.  She has a 7.5 pack-year smoking history. She has never used smokeless tobacco. She reports that she drinks alcohol. She reports that she does not use illicit drugs.  Allergies  Allergen Reactions  . Gabapentin Itching and Swelling  . Saphris [Asenapine] Swelling    Swelling of the face  . Tramadol Swelling    Causes legs to swell  . Penicillins Hives    Family History  Problem Relation Age of Onset  . Hypertension Mother   . Hypertension Father     Prior to Admission medications   Medication Sig Start Date End Date Taking? Authorizing Provider  carvedilol (COREG) 12.5 MG tablet Take 12.5 mg by mouth 2 (two) times daily.    Yes Historical Provider, MD  cloNIDine (CATAPRES) 0.1 MG tablet Take 0.1 mg by mouth daily.   Yes Historical Provider, MD  DULoxetine (CYMBALTA) 60 MG capsule Take 60 mg by mouth daily.  Yes Historical Provider, MD  folic acid (FOLVITE) A999333 MCG tablet Take 400 mcg by mouth every morning.   Yes Historical Provider, MD  furosemide (LASIX) 20 MG tablet Take 20 mg by mouth every morning.   Yes Historical Provider, MD  insulin aspart (NOVOLOG) 100 UNIT/ML injection Inject 2-10 Units into the skin 3 (three) times daily before meals. SSI: 0-200 = 2 units, 201-250 = 4 units, 251-300 = 6 units, 301-350 = 8 units, 351-400 = 10 units   Yes Historical Provider, MD  insulin glargine (LANTUS) 100 UNIT/ML injection Inject 45 Units into the skin at bedtime.    Yes Historical Provider, MD   levETIRAcetam (KEPPRA) 500 MG tablet Take 500 mg by mouth 2 (two) times daily.   Yes Historical Provider, MD  Multiple Vitamins-Minerals (SENIOR MULTIVITAMIN PLUS PO) Take 1 tablet by mouth every morning.   Yes Historical Provider, MD  oxyCODONE-acetaminophen (PERCOCET/ROXICET) 5-325 MG per tablet Take 1 tablet by mouth every 6 (six) hours as needed for severe pain. 02/15/14  Yes Resa Miner Lawyer, PA-C  polyethylene glycol (MIRALAX / GLYCOLAX) packet Take 17 g by mouth 2 (two) times daily.   Yes Historical Provider, MD  pregabalin (LYRICA) 75 MG capsule Take 75 mg by mouth 2 (two) times daily.   Yes Historical Provider, MD  simethicone (MYLICON) 80 MG chewable tablet Chew 80 mg by mouth every 8 (eight) hours as needed for flatulence.   Yes Historical Provider, MD    Physical Exam: Filed Vitals:   04/11/14 2105 04/11/14 2324 04/12/14 0124 04/12/14 0253  BP: 212/108 179/100 130/60 156/87  Pulse: 112 101 80 85  Temp: 98.6 F (37 C)  98.1 F (36.7 C) 97.6 F (36.4 C)  TempSrc: Oral  Oral Axillary  Resp: 18 20 20 20   Height:    5\' 4"  (1.626 m)  Weight:    86.592 kg (190 lb 14.4 oz)  SpO2: 100% 100% 100% 100%    General: Alert, Awake and Oriented to Time, Place and Person. Appear in mild distress Eyes: PERRL ENT: Oral Mucosa clear moist. Neck: no JVD Cardiovascular: S1 and S2 Present, no Murmur, Peripheral Pulses Present Respiratory: Bilateral Air entry equal and Decreased, Clear to Auscultation, noCrackles, no wheezes Abdomen: Bowel Sound Present, Soft and Non tender Skin: no Rash Extremities: Bilateral Pedal edema, no calf tenderness, left foot partial amputation, this and skin sloughing. Neurologic: Grossly no focal neuro deficit.  Labs on Admission:  CBC:  Recent Labs Lab 04/11/14 1658  WBC 4.3  NEUTROABS 2.8  HGB 8.3*  HCT 25.4*  MCV 85.2  PLT 249    CMP     Component Value Date/Time   NA 134* 04/11/2014 2344   K 3.5* 04/11/2014 2344   CL 102 04/11/2014 2344   CO2  17* 04/11/2014 2344   GLUCOSE 214* 04/11/2014 2344   BUN 26* 04/11/2014 2344   CREATININE 2.20* 04/11/2014 2344   CALCIUM 8.3* 04/11/2014 2344   PROT 6.7 04/11/2014 1658   ALBUMIN 1.8* 04/11/2014 1658   AST 19 04/11/2014 1658   ALT 22 04/11/2014 1658   ALKPHOS 91 04/11/2014 1658   BILITOT <0.2* 04/11/2014 1658   GFRNONAA 25* 04/11/2014 2344   GFRAA 29* 04/11/2014 2344    No results found for this basename: LIPASE, AMYLASE,  in the last 168 hours No results found for this basename: AMMONIA,  in the last 168 hours   Recent Labs Lab 04/11/14 1658 04/11/14 2344  TROPONINI <0.30 <0.30   BNP (last 3  results)  Recent Labs  06/01/13 1619 02/21/14 0104 02/23/14 0224  PROBNP 1210.0* 930.6* 2305.0*    Radiological Exams on Admission: Dg Chest 2 View  04/11/2014   CLINICAL DATA:  Wound check left foot. Right humerus fracture. Smoker.  EXAM: CHEST  2 VIEW  COMPARISON:  02/22/2014  FINDINGS: The heart size and mediastinal contours are within normal limits. Both lungs are clear. The visualized skeletal structures are unremarkable.  IMPRESSION: No active cardiopulmonary disease.   Electronically Signed   By: Lucienne Capers M.D.   On: 04/11/2014 22:52   Dg Shoulder Right  04/11/2014   CLINICAL DATA:  Fall, right arm pain  EXAM: RIGHT SHOULDER - 2+ VIEW  COMPARISON:  Previous imaging, please see prior reports from today and 03/07/2014  FINDINGS: There is no evidence of fracture or dislocation involving the proximal humerus. There is no evidence of arthropathy or other focal bone abnormality. Soft tissues are unremarkable.  IMPRESSION: Negative.   Electronically Signed   By: Conchita Paris M.D.   On: 04/11/2014 18:13   Dg Elbow Complete Right  04/11/2014   CLINICAL DATA:  Right elbow pain, fall  EXAM: RIGHT ELBOW - COMPLETE 3+ VIEW  COMPARISON:  Humerus radiographs same date, elbow radiographs 03/07/2014  FINDINGS: Right supracondylar humeral fracture is reidentified with mild impaction and mild apex volar angulation  of the fracture fragments.  IMPRESSION: Right humeral supracondylar fracture reidentified.   Electronically Signed   By: Conchita Paris M.D.   On: 04/11/2014 18:07   Dg Humerus Right  04/11/2014   ADDENDUM REPORT: 04/11/2014 19:16  ADDENDUM: Comparison is now available to previous radiographs 03/07/2014 and the supracondylar fracture is not acute. I discussed these findings with Marissa, the physician's assistant taking care of the patient, on 04/11/2014 at 7:16 p.m.   Electronically Signed   By: Conchita Paris M.D.   On: 04/11/2014 19:16   04/11/2014   CLINICAL DATA:  Fall, right humerus pain  EXAM: RIGHT HUMERUS - 2+ VIEW  COMPARISON:  Elbow radiographs same date, dictated separately  FINDINGS: There is a minimally impacted transverse right humeral supracondylar fracture with mild apex volar angulation of the fracture fragments. Overlying soft tissue swelling is evident.  IMPRESSION: Acute right humeral supracondylar fracture.  Electronically Signed: By: Conchita Paris M.D. On: 04/11/2014 18:05   Dg Foot Complete Left  04/11/2014   CLINICAL DATA:  Left foot pain and bruising  EXAM: LEFT FOOT - COMPLETE 3+ VIEW  COMPARISON:  02/21/2014  FINDINGS: Evidence of pan-transmetatarsal amputation reidentified. Soft tissue defect is noted at the distal aspect of the stump. An osseous fragment is noted adjacent to the medial cuneiform. This is stable. No new osseous destruction or radiopaque foreign body.  IMPRESSION: Stable evidence of transmetatarsal amputation with soft tissue defect at the distal aspect of the stump.   Electronically Signed   By: Conchita Paris M.D.   On: 04/11/2014 18:09    Assessment/Plan Principal Problem:   Cellulitis Active Problems:   UTI (lower urinary tract infection)   Diabetic foot ulcer   CKD (chronic kidney disease) stage 3, GFR 30-59 ml/min   Seizure   Uncontrolled type 2 diabetes mellitus with hyperosmolar nonketotic hyperglycemia   Schizoaffective disorder, unspecified  condition   1. Cellulitis The patient is presenting with complaints of left foot ulcer. She is found possible cellulitis of the left foot. With this at present I would admit her to the hospital and cover her broadly with IV vancomycin and Zosyn to  include also continue giving her with IV fluids will follow cultures. Orthopedic is already consulted will followup with the patient.  2. Uncontrolled diabetes mellitus with hyperglycemia. Her blood sugar initially at the time of the presentation was above 500. Has improved significantly with IV fluids. She doesn't appear to have any anion gap. We will continue to monitor her BMP. Present her on resistant sliding scale. Also continue her home Lantus. Most likely she is noncompliant with her medication. Metabolic acidosis but no evidence of DKA. Possibly due to Due to dehydration  3. UTI. Antibiotics would also cover her for UTI   4. Chronic kidney disease. At present stable. Continue with IV fluids for  5. Seizure disorder. Continue Keppra.  6. Accelerated hypertension. Improved with medications. Next I would continue her home medications.  DVT Prophylaxis: subcutaneous Heparin Nutrition: Cardiac and diabetic diet  Code Status: Full  Disposition: Admitted to inpatient in telemetry unit.  Author: Berle Mull, MD [facility] Pager: [phone] 04/12/2014, 3:44 AM    If 7PM-7AM, please contact night-coverage [email or web address] Password [email or web address]  **Disclaimer: This note may have been dictated with voice recognition software. Similar sounding words can inadvertently be transcribed and this note may contain transcription errors which may not have been corrected upon publication of note.**

## 2014-04-12 NOTE — Progress Notes (Signed)
ANTIBIOTIC CONSULT NOTE - INITIAL  Pharmacy Consult for Cefepime Indication: wound infection  Allergies  Allergen Reactions  . Gabapentin Itching and Swelling  . Saphris [Asenapine] Swelling    Swelling of the face  . Tramadol Swelling    Causes legs to swell  . Penicillins Hives    Has tolerated Cefepime and Ceftriaxone.    Patient Measurements: Height: 5\' 4"  (162.6 cm) Weight: 190 lb 14.4 oz (86.592 kg) IBW/kg (Calculated) : 54.7  Vital Signs: Temp: 98 F (36.7 C) (09/08 1039) Temp src: Oral (09/08 1039) BP: 124/64 mmHg (09/08 1039) Pulse Rate: 87 (09/08 1039) Intake/Output from previous day: 09/07 0701 - 09/08 0700 In: 1140 [P.O.:240; IV Piggyback:900] Out: 100 [Urine:100] Intake/Output from this shift: Total I/O In: 443.3 [P.O.:120; I.V.:323.3] Out: -   Labs:  Recent Labs  04/11/14 1658 04/11/14 2344 04/12/14 0515  WBC 4.3  --  3.7*  HGB 8.3*  --  8.1*  PLT 249  --  228  CREATININE 2.37* 2.20* 2.43*   Estimated Creatinine Clearance: 30.5 ml/min (by C-G formula based on Cr of 2.43). No results found for this basename: VANCOTROUGH, VANCOPEAK, VANCORANDOM, GENTTROUGH, GENTPEAK, GENTRANDOM, TOBRATROUGH, TOBRAPEAK, TOBRARND, AMIKACINPEAK, AMIKACINTROU, AMIKACIN,  in the last 72 hours    Assessment: 70 yoF with PMHx of uncontrolled DM, CKD, HTN, Hep C, seizures, MRSA infection, schizophrenia presenting to the ED with left leg ulcer draining pus. Pt s/p humeral fracture of right arm recently. Pharmacy was consulted to dose vancomycin and Cipro.  Now, MD adjusting regimen to vancomycin and cefepime for DM foot ulcer and possible cellulitis.  9/8 >>clindamycin x 1 9/8 >>cipro x 1 9/8 >>vancomycin >> 9/8 >>cefepime >>  Tmax: AF WBCs: low Renal: AoCKD, SCr 2.43, CrCl~38 ml/min (CG), ~32 ml/min (normalized)  9/7 urine: sent 9/8 MRSA screen (-)  Goal of Therapy:  Vancomycin trough level 15-20 mcg/ml (higher trough due to deeper infection, abscess) Doses  adjusted per renal function Eradication of infection  Plan:  1.  Add Cefepime 2g IV q24h. 2.  Continue Vancomycin as ordered. 3.  F/u daily.  Hershal Coria 04/12/2014,11:07 AM

## 2014-04-13 ENCOUNTER — Inpatient Hospital Stay (HOSPITAL_COMMUNITY): Payer: Medicare Other

## 2014-04-13 DIAGNOSIS — L03119 Cellulitis of unspecified part of limb: Secondary | ICD-10-CM

## 2014-04-13 DIAGNOSIS — E1129 Type 2 diabetes mellitus with other diabetic kidney complication: Secondary | ICD-10-CM

## 2014-04-13 DIAGNOSIS — N184 Chronic kidney disease, stage 4 (severe): Secondary | ICD-10-CM

## 2014-04-13 DIAGNOSIS — L02619 Cutaneous abscess of unspecified foot: Secondary | ICD-10-CM

## 2014-04-13 DIAGNOSIS — E1100 Type 2 diabetes mellitus with hyperosmolarity without nonketotic hyperglycemic-hyperosmolar coma (NKHHC): Secondary | ICD-10-CM

## 2014-04-13 DIAGNOSIS — D509 Iron deficiency anemia, unspecified: Secondary | ICD-10-CM

## 2014-04-13 LAB — BASIC METABOLIC PANEL
ANION GAP: 11 (ref 5–15)
BUN: 33 mg/dL — AB (ref 6–23)
CHLORIDE: 103 meq/L (ref 96–112)
CO2: 17 meq/L — AB (ref 19–32)
Calcium: 7.9 mg/dL — ABNORMAL LOW (ref 8.4–10.5)
Creatinine, Ser: 2.67 mg/dL — ABNORMAL HIGH (ref 0.50–1.10)
GFR calc non Af Amer: 20 mL/min — ABNORMAL LOW (ref >90)
GFR, EST AFRICAN AMERICAN: 23 mL/min — AB (ref >90)
Glucose, Bld: 137 mg/dL — ABNORMAL HIGH (ref 70–99)
Potassium: 4 mEq/L (ref 3.7–5.3)
Sodium: 131 mEq/L — ABNORMAL LOW (ref 137–147)

## 2014-04-13 LAB — IRON AND TIBC
IRON: 28 ug/dL — AB (ref 42–135)
Saturation Ratios: 11 % — ABNORMAL LOW (ref 20–55)
TIBC: 251 ug/dL (ref 250–470)
UIBC: 223 ug/dL (ref 125–400)

## 2014-04-13 LAB — FERRITIN: Ferritin: 81 ng/mL (ref 10–291)

## 2014-04-13 LAB — URINE CULTURE
Colony Count: 50000
Special Requests: NORMAL

## 2014-04-13 LAB — CBC
HEMATOCRIT: 20.9 % — AB (ref 36.0–46.0)
HEMOGLOBIN: 6.7 g/dL — AB (ref 12.0–15.0)
MCH: 27.5 pg (ref 26.0–34.0)
MCHC: 32.1 g/dL (ref 30.0–36.0)
MCV: 85.7 fL (ref 78.0–100.0)
Platelets: 190 10*3/uL (ref 150–400)
RBC: 2.44 MIL/uL — ABNORMAL LOW (ref 3.87–5.11)
RDW: 15.1 % (ref 11.5–15.5)
WBC: 3.8 10*3/uL — ABNORMAL LOW (ref 4.0–10.5)

## 2014-04-13 LAB — GLUCOSE, CAPILLARY
GLUCOSE-CAPILLARY: 168 mg/dL — AB (ref 70–99)
GLUCOSE-CAPILLARY: 159 mg/dL — AB (ref 70–99)
GLUCOSE-CAPILLARY: 199 mg/dL — AB (ref 70–99)
GLUCOSE-CAPILLARY: 223 mg/dL — AB (ref 70–99)
GLUCOSE-CAPILLARY: 234 mg/dL — AB (ref 70–99)

## 2014-04-13 LAB — HEMOGLOBIN A1C
Hgb A1c MFr Bld: 8.8 % — ABNORMAL HIGH (ref <5.7)
Mean Plasma Glucose: 206 mg/dL — ABNORMAL HIGH (ref <117)

## 2014-04-13 LAB — VITAMIN B12: VITAMIN B 12: 451 pg/mL (ref 211–911)

## 2014-04-13 LAB — OCCULT BLOOD X 1 CARD TO LAB, STOOL: Fecal Occult Bld: POSITIVE — AB

## 2014-04-13 MED ORDER — INSULIN ASPART 100 UNIT/ML ~~LOC~~ SOLN
3.0000 [IU] | Freq: Three times a day (TID) | SUBCUTANEOUS | Status: DC
  Administered 2014-04-13 – 2014-04-14 (×3): 3 [IU] via SUBCUTANEOUS

## 2014-04-13 MED ORDER — FOLIC ACID 400 MCG PO TABS: 400.0000 ug | ORAL_TABLET | Freq: Every morning | ORAL | Status: DC

## 2014-04-13 MED ORDER — FUROSEMIDE 20 MG PO TABS
20.0000 mg | ORAL_TABLET | Freq: Every morning | ORAL | Status: DC
  Administered 2014-04-14: 20 mg via ORAL
  Filled 2014-04-13: qty 1

## 2014-04-13 MED ORDER — SILVER SULFADIAZINE 1 % EX CREA
TOPICAL_CREAM | Freq: Every day | CUTANEOUS | Status: DC
  Administered 2014-04-13: 12:00:00 via TOPICAL
  Administered 2014-04-13: 1 via TOPICAL
  Administered 2014-04-14: 10:00:00 via TOPICAL
  Filled 2014-04-13: qty 50

## 2014-04-13 MED ORDER — SENNOSIDES-DOCUSATE SODIUM 8.6-50 MG PO TABS
2.0000 | ORAL_TABLET | Freq: Every day | ORAL | Status: DC | PRN
  Administered 2014-04-13: 2 via ORAL
  Filled 2014-04-13: qty 2

## 2014-04-13 MED ORDER — SODIUM BICARBONATE 650 MG PO TABS
650.0000 mg | ORAL_TABLET | Freq: Every day | ORAL | Status: DC
  Administered 2014-04-13 – 2014-04-14 (×2): 650 mg via ORAL
  Filled 2014-04-13 (×2): qty 1

## 2014-04-13 MED ORDER — FERROUS SULFATE 325 (65 FE) MG PO TABS
325.0000 mg | ORAL_TABLET | Freq: Three times a day (TID) | ORAL | Status: DC
  Administered 2014-04-13 – 2014-04-14 (×3): 325 mg via ORAL
  Filled 2014-04-13 (×5): qty 1

## 2014-04-13 MED ORDER — SENNOSIDES-DOCUSATE SODIUM 8.6-50 MG PO TABS: 1.0000 | ORAL_TABLET | Freq: Every day | ORAL | Status: DC | PRN

## 2014-04-13 MED ORDER — SODIUM CHLORIDE 0.9 % IV SOLN
510.0000 mg | Freq: Once | INTRAVENOUS | Status: AC
  Administered 2014-04-13: 510 mg via INTRAVENOUS
  Filled 2014-04-13: qty 17

## 2014-04-13 MED ORDER — FOLIC ACID 0.5 MG HALF TAB
0.5000 mg | ORAL_TABLET | Freq: Every day | ORAL | Status: DC
  Administered 2014-04-14: 0.5 mg via ORAL
  Filled 2014-04-13: qty 1

## 2014-04-13 NOTE — Progress Notes (Signed)
Transitional Care Clinic Care Coordination Note: Admit date:  04/12/2014 Discharge date: TBD Discharge Disposition: Home Patient contact information: [phone] (Connie-patient's sister)  This Case Manager reviewed patient's EMR and determined patient would benefit from chronic care management services through the [facility]. Patient has had 4 admissions in the last year and 6 ED visits in the last 5 months. Patient admitted with complaints of left foot ulcer. Patient has a history of congestive heart failure, hypertension, diabetes mellitus, chronic kidney disease.  This Case Manager met with patient to discuss the services and medical management that can be provided at the [facility].  Patient agreeable to receiving services at the [facility] after discharge.   Consent for Chronic Care Management for Transitional Care Clinic signed by patient.  Patient scheduled for Transitional Care appointment on 04/20/14 at 11:30.  Clinic information and appointment time provided to patient.    Assessment:       Home Environment: Patient lives with her sister in a private residence.       Support System: Sister       Level of functioning: Patient is independent with ADLs though she does use a walker for mobility.        Home DME: walker       Home care services: none       Transportation: Medicaid transport to MD appointments        Medications: Patient indicates she is able to afford medications.  Informed patient that she will be able to access the [facility] upon discharge if she is discharged with any new scripts. Patient verbalized understanding.           Identified Barriers: Patient indicates she would like Behavioral Health services upon discharge.  Inpatient Case Manager Cookie Rand aware. She has informed physician and inpatient Social Work. [facility] and [employer] Social Worker also updated.            Transitional Care Clinic Case Manager collaberated with Cookie McGibboney, RN, BSN, CCM, and Cookie informed of patient's Transitional Care Clinic appointment.  Appointment time documented on AVS.

## 2014-04-13 NOTE — Progress Notes (Signed)
CRITICAL VALUE ALERT  Critical value received:  hgb 6.7  Date of notification:  04/13/14  Time of notification:  0515  Critical value read back:Yes.    Nurse who received alert:  Virgina Norfolk, RN  MD notified (1st page):  Baltazar Najjar  Time of first page:  0535  MD notified (2nd page):  Time of second page:  Responding MD:  Baltazar Najjar  Time MD responded:  [id number] No new orders placed. Pt is [employer] Witness.

## 2014-04-13 NOTE — Progress Notes (Signed)
Upon entering patient's room, patient stated she had disconnected IV tubing from her IV because she couldn't stand the beeping.  Educated patient regarding infection precautions, safety, and medication administration precautions and to call the nurse if IV needed adjusting.  Need reinforcement regarding this point.  Have placed new tubing and reconnected for med. Administration.  Earlier, patient had closed clamp on her saline lock of IV, and called out because IV was beeping occluded.  Have educated patient to call RN if IV is beeping.  Will continue to monitor.

## 2014-04-13 NOTE — Progress Notes (Addendum)
Nutrition Note  Pt contacted RD regarding diet liberalization and/or addition of snacks. Due to hx of uncontrolled DM2, CHF, HTN, CKD, and elevated CBGs, would not recommended to liberalize diet. Pt has excellent PO intake, 100% of meals. Encouraged pt to consume non-carbohydrate foods w/meals to satisfy appetite (non-starchy vegetables, heart healthy proteins)  RN reported pt frequently asking for graham crackers and additional snacks. Allow only one or two packages crackers as snack between meals (snacks should be equivalent to 30 gram CHO or less)   RD will allow HS snack of [place] sandwich w/water, unsweetened iced tea or diet soda.   Please consult as needed.  Atlee Abide MS RD LDN Clinical Dietitian [id number]

## 2014-04-13 NOTE — Progress Notes (Signed)
[facility] PROGRESS NOTE  [name] [id number] DOB: 02-15-1966 DOA: 04/11/2014 PCP: Maurice Small, D, MD Brief narrative 48 year old [place] Witness female with uncontrolled diabetes, stage IV chronic kidney disease seizure disorder, recent fall leading to a right humerus fracture, history of left midfoot amputation presented to the ED with increased swelling and  Pus like drainage from the left foot and admitted for cellulitis and possible abscess.   Assessment/Plan: Cellulitis of left foot On empiric vancomycin and cefepime. Seen by orthopedics and recommended no sign of infection. Continue on empiric vancomycin and transitioned to oral doxycycline upon discharge. Recommend followup with Dr. Sharol Given as outpatient.  Uncontrolled type 2 diabetes mellitus with hyperosmolar nonketotic state FSg improved. Continue home dose of Lantus. And sliding scale insulin A1c of 8.8. Order a meal time coverage.  Anemia Noted for drop in H&H this morning. In the past suggestive of iron deficiency. Check stool for occult blood. Ordered IV iron given jehovah's witness. Will add iron supplements. B12 normal.    Chronic diastolic CHF  noted for leg edema and elevated pro BNP. Discontinue fluids. We'll resume home dose Lasix  Stage IV chronic kidney disease Secondary to diabetic nephropathy.renal  Function slightly worsened this morning.(2.67, baseline of 2.3) Avoid nephrotoxins.  Hypertension Continue Coreg and clonidine  Seizure disorder Continue Keppra  Medication noncompliance Seems to be an ongoing issue. counseled on medication adherence and outpt follow uyp  Hep C Not on meds  Low Bicarbonate with metabolic acidosis Will add sodium bicarb tablets  Diet: Diabetic  DVT prophylaxis: SCDs   Code Status: Full code Family Communication: None at Bedside Disposition Plan: Home possibly tomorrow   Consultants:  Orthopedics  Procedures:  None  Antibiotics:  Iv  vancomycin 9/8>>  Cefepime 9/8-9/9  HPI/Subjective: Patient seen and examined this morning. Complains of swelling of her legs  Objective: Filed Vitals:   04/13/14 1408  BP: 113/72  Pulse: 82  Temp: 98.1 F (36.7 C)  Resp: 17    Intake/Output Summary (Last 24 hours) at 04/13/14 1635 Last data filed at 04/13/14 [id number]  Gross per 24 hour  Intake   1160 ml  Output   2400 ml  Net  -1240 ml   Filed Weights   04/12/14 0253 04/13/14 0527  Weight: 86.592 kg (190 lb 14.4 oz) 91.128 kg (200 lb 14.4 oz)    Exam:   General:  Middle-aged female in no acute distress  HEENT: No pallor, moist oral mucosa  Cardiovascular: Normal S1 and S2, no murmurs  Respiratory: Clear bilaterally, no added sounds  Abdomen: Soft, nondistended, nontender, bowel sounds present  Musculoskeletal: Warm, clean midfoot amputation ulcer, no discharge. Trace pitting edema   CNS: Alert and oriented  Data Reviewed: Basic Metabolic Panel:  Recent Labs Lab 04/11/14 1658 04/11/14 2344 04/12/14 0515 04/13/14 0424  NA 132* 134* 133* 131*  K 3.8 3.5* 3.3* 4.0  CL 98 102 99 103  CO2 19 17* 18* 17*  GLUCOSE 574* 214* 419* 137*  BUN 28* 26* 26* 33*  CREATININE 2.37* 2.20* 2.43* 2.67*  CALCIUM 8.3* 8.3* 8.3* 7.9*   Liver Function Tests:  Recent Labs Lab 04/11/14 1658 04/12/14 0515  AST 19 20  ALT 22 21  ALKPHOS 91 81  BILITOT <0.2* <0.2*  PROT 6.7 6.7  ALBUMIN 1.8* 1.8*   No results found for this basename: LIPASE, AMYLASE,  in the last 168 hours No results found for this basename: AMMONIA,  in the last 168 hours CBC:  Recent Labs Lab  04/11/14 1658 04/12/14 0515 04/13/14 0424  WBC 4.3 3.7* 3.8*  NEUTROABS 2.8  --   --   HGB 8.3* 8.1* 6.7*  HCT 25.4* 25.8* 20.9*  MCV 85.2 86.6 85.7  PLT 249 228 190   Cardiac Enzymes:  Recent Labs Lab 04/11/14 1658 04/11/14 2344  TROPONINI <0.30 <0.30   BNP (last 3 results)  Recent Labs  02/21/14 0104 02/23/14 0224 04/12/14 0520   PROBNP 930.6* 2305.0* 12801.0*   CBG:  Recent Labs Lab 04/12/14 2011 04/12/14 2354 04/13/14 0523 04/13/14 0723 04/13/14 1204  GLUCAP 190* 358* 168* 159* 199*    Recent Results (from the past 240 hour(s))  URINE CULTURE     Status: None   Collection Time    04/11/14  6:32 PM      Result Value Ref Range Status   Specimen Description URINE, CLEAN CATCH   Final   Special Requests Normal   Final   Culture  Setup Time     Final   Value: 04/12/2014 02:50     Performed at [facility]     Final   Value: 50,000 COLONIES/ML     Performed at [facility]   Culture     Final   Value: Multiple bacterial morphotypes present, none predominant. Suggest appropriate recollection if clinically indicated.     Performed at [facility]   Report Status 04/13/2014 FINAL   Final  MRSA PCR SCREENING     Status: None   Collection Time    04/12/14  3:57 AM      Result Value Ref Range Status   MRSA by PCR NEGATIVE  NEGATIVE Final   Comment:            The GeneXpert MRSA Assay (FDA     approved for NASAL specimens     only), is one component of a     comprehensive MRSA colonization     surveillance program. It is not     intended to diagnose MRSA     infection nor to guide or     monitor treatment for     MRSA infections.     Studies: Dg Chest 2 View  04/11/2014   CLINICAL DATA:  Wound check left foot. Right humerus fracture. Smoker.  EXAM: CHEST  2 VIEW  COMPARISON:  02/22/2014  FINDINGS: The heart size and mediastinal contours are within normal limits. Both lungs are clear. The visualized skeletal structures are unremarkable.  IMPRESSION: No active cardiopulmonary disease.   Electronically Signed   By: Lucienne Capers M.D.   On: 04/11/2014 22:52   Dg Shoulder Right  04/11/2014   CLINICAL DATA:  Fall, right arm pain  EXAM: RIGHT SHOULDER - 2+ VIEW  COMPARISON:  Previous imaging, please see prior reports from today and 03/07/2014  FINDINGS: There is no  evidence of fracture or dislocation involving the proximal humerus. There is no evidence of arthropathy or other focal bone abnormality. Soft tissues are unremarkable.  IMPRESSION: Negative.   Electronically Signed   By: Conchita Paris M.D.   On: 04/11/2014 18:13   Dg Elbow Complete Right  04/11/2014   CLINICAL DATA:  Right elbow pain, fall  EXAM: RIGHT ELBOW - COMPLETE 3+ VIEW  COMPARISON:  Humerus radiographs same date, elbow radiographs 03/07/2014  FINDINGS: Right supracondylar humeral fracture is reidentified with mild impaction and mild apex volar angulation of the fracture fragments.  IMPRESSION: Right humeral supracondylar fracture reidentified.   Electronically  Signed   By: Conchita Paris M.D.   On: 04/11/2014 18:07   Dg Esophagus  04/13/2014   CLINICAL DATA:  Liquid and solid dysphagia for several months.  EXAM: ESOPHOGRAM / BARIUM SWALLOW / BARIUM TABLET STUDY  TECHNIQUE: Combined double contrast and single contrast examination performed using effervescent crystals, thick barium liquid, and thin barium liquid. The patient was observed with fluoroscopy swallowing a 12mm barium sulphate tablet.  FLUOROSCOPY TIME:  1 min 48 seconds  COMPARISON:  None.  FINDINGS: No gross mucosal or submucosal lesion is identified during double-contrast examination of the thoracic esophagus. Evaluation of esophageal motility on single swallows demonstrated intermittent proximal release of the contrast bolus and secondary contrast stasis in the upper thoracic esophagus. No tertiary contractions were seen.  Thoracic esophagus was normal in contour without evidence of stricture or mass. No hiatal hernia was seen. Prominent gastroesophageal reflux was seen to the level of the carina during the water siphon test. Rapid sequence imaging of the cervical esophagus was unremarkable. A barium tablet passed without delay.  IMPRESSION: 1. No esophageal stricture. 2. Mild, nonspecific esophageal dysmotility. 3. Gastroesophageal  reflux during the water siphon test.   Electronically Signed   By: Logan Bores   On: 04/13/2014 16:21   Dg Humerus Right  04/11/2014   ADDENDUM REPORT: 04/11/2014 19:16  ADDENDUM: Comparison is now available to previous radiographs 03/07/2014 and the supracondylar fracture is not acute. I discussed these findings with Marissa, the physician's assistant taking care of the patient, on 04/11/2014 at 7:16 p.m.   Electronically Signed   By: Conchita Paris M.D.   On: 04/11/2014 19:16   04/11/2014   CLINICAL DATA:  Fall, right humerus pain  EXAM: RIGHT HUMERUS - 2+ VIEW  COMPARISON:  Elbow radiographs same date, dictated separately  FINDINGS: There is a minimally impacted transverse right humeral supracondylar fracture with mild apex volar angulation of the fracture fragments. Overlying soft tissue swelling is evident.  IMPRESSION: Acute right humeral supracondylar fracture.  Electronically Signed: By: Conchita Paris M.D. On: 04/11/2014 18:05   Dg Foot Complete Left  04/11/2014   CLINICAL DATA:  Left foot pain and bruising  EXAM: LEFT FOOT - COMPLETE 3+ VIEW  COMPARISON:  02/21/2014  FINDINGS: Evidence of pan-transmetatarsal amputation reidentified. Soft tissue defect is noted at the distal aspect of the stump. An osseous fragment is noted adjacent to the medial cuneiform. This is stable. No new osseous destruction or radiopaque foreign body.  IMPRESSION: Stable evidence of transmetatarsal amputation with soft tissue defect at the distal aspect of the stump.   Electronically Signed   By: Conchita Paris M.D.   On: 04/11/2014 18:09    Scheduled Meds: . carvedilol  12.5 mg Oral BID  . ceFEPime (MAXIPIME) IV  2 g Intravenous Q24H  . cloNIDine  0.1 mg Oral Daily  . DULoxetine  60 mg Oral Daily  . heparin  5,000 Units Subcutaneous 3 times per day  . insulin aspart  0-20 Units Subcutaneous 6 times per day  . insulin glargine  45 Units Subcutaneous QHS  . levETIRAcetam  500 mg Oral BID  . pregabalin  75 mg Oral  BID  . silver sulfADIAZINE   Topical Daily  . sodium chloride  3 mL Intravenous Q12H  . vancomycin  1,000 mg Intravenous Q24H   Continuous Infusions:     Time spent: 25 minutes    Raedyn Klinck, [place]  [facility] Pager [phone]. If 7PM-7AM, please contact night-coverage at [email or web address], password [facility]  04/13/2014, 4:35 PM  LOS: 2 days

## 2014-04-13 NOTE — Progress Notes (Signed)
Patient has refused to allow staff to turn on bed alarm.  Patient has fallen with injury in the last 6 months and was recently admitted to the hospital for seizures.  Educated regarding fall precautions, patient safety, and risks associated with refusing bed alarm and falling.  Patient alert and oriented, and refuses to allow staff to turn on the bed alarm.  Charge nurse notified. Will continue to monitor closely.

## 2014-04-14 LAB — CBC
HCT: 22.8 % — ABNORMAL LOW (ref 36.0–46.0)
Hemoglobin: 7.5 g/dL — ABNORMAL LOW (ref 12.0–15.0)
MCH: 28.1 pg (ref 26.0–34.0)
MCHC: 32.9 g/dL (ref 30.0–36.0)
MCV: 85.4 fL (ref 78.0–100.0)
Platelets: 221 10*3/uL (ref 150–400)
RBC: 2.67 MIL/uL — ABNORMAL LOW (ref 3.87–5.11)
RDW: 15.3 % (ref 11.5–15.5)
WBC: 3.7 10*3/uL — ABNORMAL LOW (ref 4.0–10.5)

## 2014-04-14 LAB — BASIC METABOLIC PANEL
Anion gap: 12 (ref 5–15)
BUN: 39 mg/dL — ABNORMAL HIGH (ref 6–23)
CALCIUM: 8.4 mg/dL (ref 8.4–10.5)
CO2: 18 mEq/L — ABNORMAL LOW (ref 19–32)
Chloride: 104 mEq/L (ref 96–112)
Creatinine, Ser: 2.95 mg/dL — ABNORMAL HIGH (ref 0.50–1.10)
GFR, EST AFRICAN AMERICAN: 21 mL/min — AB (ref >90)
GFR, EST NON AFRICAN AMERICAN: 18 mL/min — AB (ref >90)
GLUCOSE: 152 mg/dL — AB (ref 70–99)
Potassium: 4.6 mEq/L (ref 3.7–5.3)
SODIUM: 134 meq/L — AB (ref 137–147)

## 2014-04-14 LAB — GLUCOSE, CAPILLARY
GLUCOSE-CAPILLARY: 165 mg/dL — AB (ref 70–99)
GLUCOSE-CAPILLARY: 167 mg/dL — AB (ref 70–99)
Glucose-Capillary: 133 mg/dL — ABNORMAL HIGH (ref 70–99)
Glucose-Capillary: 165 mg/dL — ABNORMAL HIGH (ref 70–99)

## 2014-04-14 MED ORDER — DOXYCYCLINE HYCLATE 100 MG PO TABS
100.0000 mg | ORAL_TABLET | Freq: Two times a day (BID) | ORAL | Status: AC
Start: 2014-04-14 — End: 2014-04-21

## 2014-04-14 MED ORDER — SODIUM BICARBONATE 650 MG PO TABS: 650.0000 mg | ORAL_TABLET | Freq: Every day | ORAL | Status: DC

## 2014-04-14 MED ORDER — CARVEDILOL 25 MG PO TABS: 25.0000 mg | ORAL_TABLET | Freq: Two times a day (BID) | ORAL | Status: DC

## 2014-04-14 MED ORDER — SILVER SULFADIAZINE 1 % EX CREA: TOPICAL_CREAM | Freq: Every day | CUTANEOUS | Status: DC

## 2014-04-14 MED ORDER — FERROUS SULFATE 325 (65 FE) MG PO TABS: 325.0000 mg | ORAL_TABLET | Freq: Three times a day (TID) | ORAL | Status: DC

## 2014-04-14 MED ORDER — OXYCODONE-ACETAMINOPHEN 5-325 MG PO TABS: 1.0000 | ORAL_TABLET | Freq: Three times a day (TID) | ORAL | Status: DC | PRN

## 2014-04-14 NOTE — ED Provider Notes (Signed)
Medical screening examination/treatment/procedure(s) were conducted as a shared visit with non-physician practitioner(s) and myself.  I personally evaluated the patient during the encounter.   EKG Interpretation   Date/Time:  Monday April 11 2014 18:12:00 EDT Ventricular Rate:  106 PR Interval:  161 QRS Duration: 89 QT Interval:  353 QTC Calculation: 469 R Axis:   110 Text Interpretation:  Sinus tachycardia has replaced Normal sinus rhythm  Ventricular premature complex Right axis deviation Consider left  ventricular hypertrophy Baseline wander in lead(s) V6 Confirmed by Debby Freiberg [phone] on 04/11/2014 6:17:26 PM      I performed an examination on the patient including cardiac, pulmonary, and gi systems which were unremarkable.  Pt has swelling and a chronic wound of L foot stump, with swelling concerning for possible early infection.  She also had swelling of her R arm in context of old fall and known supracondylar infection.  She arrived to the ED without a splint.  Admitted to the hospital for abx and monitoring.    Debby Freiberg, MD 04/14/14 1249

## 2014-04-14 NOTE — Discharge Summary (Signed)
Physician Discharge Summary  [name] [id number] DOB: [birth year] DOA: 04/11/2014  PCP: [name], D, MD  Admit date: 04/11/2014 Discharge date: 04/14/2014  Time spent: 35  minutes  Recommendations for Outpatient Follow-up:  1. discharge home with outpt PCP follow up. Needs H&h and renal function checked during outpt visit. Also needs referral to outpt nephrology. 2. Follow up with Dr [name] in 1 week  Discharge Diagnoses:  Principal Problem:   Cellulitis of leg, left  Active Problems:   Uncontrolled type 2 diabetes mellitus with hyperosmolar nonketotic hyperglycemia   Seizure disorder   Diabetic foot ulcer   Hep C w/o coma, chronic   Stage 4 chronic kidney disease due to type 2 diabetes mellitus   Hypertension, accelerated   Normocytic anemia   Noncompliance   Schizoaffective disorder, unspecified condition   Anemia, iron deficiency   Discharge Condition: fair  Diet recommendation: diabetic  Filed Weights   04/12/14 0253 04/13/14 0527 04/14/14 0300  Weight: 86.592 kg (190 lb 14.4 oz) 91.128 kg (200 lb 14.4 oz) 92.488 kg (203 lb 14.4 oz)    History of present illness:  Please refer to admission H&P for  details, but in brief, 48 year old [name] female with uncontrolled diabetes, stage IV chronic kidney disease seizure disorder, recent fall leading to a right humerus fracture, history of left midfoot amputation presented to the ED with increased swelling and Pus like drainage from the left foot and admitted for cellulitis and possible abscess.   Hospital Course:  Cellulitis of left foot  Placed On empiric vancomycin and cefepime. Seen by orthopedics and recommended no sign of infection or abscess. Symptoms improved and will discharge on   oral doxycycline to complete a 10 day course. [name] Recommend followup with Dr. Sharol [name] as outpatient.    Uncontrolled type 2 diabetes mellitus with hyperosmolar nonketotic state  FSg improved. Continue home dose of Lantus  and sliding scale insulin  A1c of 8.8.   Anemia  Noted for drop in H&H  Iron panel  suggestive of iron deficiency. Check stool for occult blood. [name]  IV iron as pt is  jehovah's witness. Will add iron supplements. B12 normal. Hb 7.5 this amd needs outpt follow up. Colonoscopy done in 214 showed some hemorrhoids only.  Chronic diastolic CHF  noted for leg edema and elevated pro BNP. Discontinued fluids. We'll resumed home dose Lasix . No sign of acute failure.   Stage IV chronic kidney disease  Secondary to diabetic nephropathy. renal Function worsened from baseline (2.95, baseline of 2.3) Avoid nephrotoxins.  Spoke with PCP and recommended outpt nephrology referral  Hypertension  Continue Coreg and clonidine . Increased dose  of coreg  Seizure disorder  Continue Keppra   Medication noncompliance  Seems to be an ongoing issue. counseled on medication adherence and outpt follow up . Spoke with PCP at length. Pt has been non compliant with follow up appt . PCP trying to set up outpt appts and referrals. PCP office will call for follow up  Hep C  Not on meds   Low Bicarbonate with metabolic acidosis  Will add sodium bicarb tablets   Right humerus fracture  patient to follow up with Dr [name] as outpt   Diet: Diabetic    Code Status: Full code   Family Communication: None at Bedside  Disposition Plan: Home   Consultants:  Orthopedics ( Dr [name])   Procedures:  None   Antibiotics:  Iv vancomycin 9/8>>  Cefepime 9/8-9/9  Discharge Exam: Filed Vitals:   04/14/14 0900  BP: 157/89  Pulse: 90  Temp:   Resp:    General: Middle-aged female in no acute distress  HEENT: No pallor, moist oral mucosa  Cardiovascular: Normal S1 and S2, no murmurs  Respiratory: Clear bilaterally, no added sounds  Abdomen: Soft, nondistended, nontender, bowel sounds present  Musculoskeletal: Warm, clean midfoot amputation ulcer, no discharge. Trace pitting edema , rt arm cast  and sling CNS: Alert and oriented    Discharge Instructions You were cared for by a hospitalist during your hospital stay. If you have any questions about your discharge medications or the care you received while you were in the hospital after you are discharged, you can call the unit and asked to speak with the hospitalist on call if the hospitalist that took care of you is not available. Once you are discharged, your primary care physician will handle any further medical issues. Please note that NO REFILLS for any discharge medications will be authorized once you are discharged, as it is imperative that you return to your primary care physician (or establish a relationship with a primary care physician if you do not have one) for your aftercare needs so that they can reassess your need for medications and monitor your lab values.   Current Discharge Medication List    START taking these medications   Details  doxycycline (VIBRA-TABS) 100 MG tablet Take 1 tablet (100 mg total) by mouth 2 (two) times daily. Qty: 16 tablet, Refills: 0    ferrous sulfate 325 (65 FE) MG tablet Take 1 tablet (325 mg total) by mouth 3 (three) times daily with meals. Qty: 90 tablet, Refills: 3    silver sulfADIAZINE (SILVADENE) 1 % cream Apply topically daily. Qty: 50 g, Refills: 0    sodium bicarbonate 650 MG tablet Take 1 tablet (650 mg total) by mouth daily. Qty: 30 tablet, Refills: 0      CONTINUE these medications which have CHANGED   Details  carvedilol (COREG) 25 MG tablet Take 1 tablet (25 mg total) by mouth 2 (two) times daily. Qty: 60 tablet, Refills: 0 until 04/21/2014    oxyCODONE-acetaminophen (PERCOCET/ROXICET) 5-325 MG per tablet Take 1 tablet by mouth every 8 (eight) hours as needed for severe pain. Qty: 20 tablet, Refills: 0      CONTINUE these medications which have NOT CHANGED   Details  cloNIDine (CATAPRES) 0.1 MG tablet Take 0.1 mg by mouth daily.    DULoxetine (CYMBALTA) 60 MG  capsule Take 60 mg by mouth daily.    folic acid (FOLVITE) A999333 MCG tablet Take 400 mcg by mouth every morning.    furosemide (LASIX) 20 MG tablet Take 20 mg by mouth every morning.    insulin aspart (NOVOLOG) 100 UNIT/ML injection Inject 2-10 Units into the skin 3 (three) times daily before meals. SSI: 0-200 = 2 units, 201-250 = 4 units, 251-300 = 6 units, 301-350 = 8 units, 351-400 = 10 units    insulin glargine (LANTUS) 100 UNIT/ML injection Inject 45 Units into the skin at bedtime.     levETIRAcetam (KEPPRA) 500 MG tablet Take 500 mg by mouth 2 (two) times daily.    Multiple Vitamins-Minerals (SENIOR MULTIVITAMIN PLUS PO) Take 1 tablet by mouth every morning.    polyethylene glycol (MIRALAX / GLYCOLAX) packet Take 17 g by mouth 2 (two) times daily.    pregabalin (LYRICA) 75 MG capsule Take 75 mg by mouth 2 (two) times daily.  simethicone (MYLICON) 80 MG chewable tablet Chew 80 mg by mouth every 8 (eight) hours as needed for flatulence.       Allergies  Allergen Reactions  . Gabapentin Itching and Swelling  . Saphris [Asenapine] Swelling    Swelling of the face  . Tramadol Swelling    Causes legs to swell  . Penicillins Hives    Has tolerated Cefepime and Ceftriaxone.   Follow-up Information   Follow up with DUDA,MARCUS V, MD In 10 days.   Specialty:  Orthopedic Surgery   Contact information:   [place] [address] [phone]       Follow up with [name], D, MD In 1 week. (office will call)    Specialty:  Family Medicine   Contact information:   [address] [place] [id number] [phone]        The results of significant diagnostics from this hospitalization (including imaging, microbiology, ancillary and laboratory) are listed below for reference.    Significant Diagnostic Studies: Dg Chest 2 View  04/11/2014   CLINICAL DATA:  Wound check left foot. Right humerus fracture. Smoker.  EXAM: CHEST  2 VIEW   COMPARISON:  02/22/2014  FINDINGS: The heart size and mediastinal contours are within normal limits. Both lungs are clear. The visualized skeletal structures are unremarkable.  IMPRESSION: No active cardiopulmonary disease.   Electronically Signed   By: Lucienne Capers M.D.   On: 04/11/2014 22:52   Dg Shoulder Right  04/11/2014   CLINICAL DATA:  Fall, right arm pain  EXAM: RIGHT SHOULDER - 2+ VIEW  COMPARISON:  Previous imaging, please see prior reports from today and 03/07/2014  FINDINGS: There is no evidence of fracture or dislocation involving the proximal humerus. There is no evidence of arthropathy or other focal bone abnormality. Soft tissues are unremarkable.  IMPRESSION: Negative.   Electronically Signed   By: Conchita Paris M.D.   On: 04/11/2014 18:13   Dg Elbow Complete Right  04/11/2014   CLINICAL DATA:  Right elbow pain, fall  EXAM: RIGHT ELBOW - COMPLETE 3+ VIEW  COMPARISON:  Humerus radiographs same date, elbow radiographs 03/07/2014  FINDINGS: Right supracondylar humeral fracture is reidentified with mild impaction and mild apex volar angulation of the fracture fragments.  IMPRESSION: Right humeral supracondylar fracture reidentified.   Electronically Signed   By: Conchita Paris M.D.   On: 04/11/2014 18:07   Dg Esophagus  04/13/2014   CLINICAL DATA:  Liquid and solid dysphagia for several months.  EXAM: ESOPHOGRAM / BARIUM SWALLOW / BARIUM TABLET STUDY  TECHNIQUE: Combined double contrast and single contrast examination performed using effervescent crystals, thick barium liquid, and thin barium liquid. The patient was observed with fluoroscopy swallowing a 25mm barium sulphate tablet.  FLUOROSCOPY TIME:  1 min 48 seconds  COMPARISON:  None.  FINDINGS: No gross mucosal or submucosal lesion is identified during double-contrast examination of the thoracic esophagus. Evaluation of esophageal motility on single swallows demonstrated intermittent proximal release of the contrast bolus and secondary  contrast stasis in the upper thoracic esophagus. No tertiary contractions were seen.  Thoracic esophagus was normal in contour without evidence of stricture or mass. No hiatal hernia was seen. Prominent gastroesophageal reflux was seen to the level of the carina during the water siphon test. Rapid sequence imaging of the cervical esophagus was unremarkable. A barium tablet passed without delay.  IMPRESSION: 1. No esophageal stricture. 2. Mild, nonspecific esophageal dysmotility. 3. Gastroesophageal reflux during the water  siphon test.   Electronically Signed   By: Logan Bores   On: 04/13/2014 16:21   Dg Humerus Right  04/11/2014   ADDENDUM REPORT: 04/11/2014 19:16  ADDENDUM: Comparison is now available to previous radiographs 03/07/2014 and the supracondylar fracture is not acute. I discussed these findings with Marissa, the physician's assistant taking care of the patient, on 04/11/2014 at 7:16 p.m.   Electronically Signed   By: Conchita Paris M.D.   On: 04/11/2014 19:16   04/11/2014   CLINICAL DATA:  Fall, right humerus pain  EXAM: RIGHT HUMERUS - 2+ VIEW  COMPARISON:  Elbow radiographs same date, dictated separately  FINDINGS: There is a minimally impacted transverse right humeral supracondylar fracture with mild apex volar angulation of the fracture fragments. Overlying soft tissue swelling is evident.  IMPRESSION: Acute right humeral supracondylar fracture.  Electronically Signed: By: Conchita Paris M.D. On: 04/11/2014 18:05   Dg Foot Complete Left  04/11/2014   CLINICAL DATA:  Left foot pain and bruising  EXAM: LEFT FOOT - COMPLETE 3+ VIEW  COMPARISON:  02/21/2014  FINDINGS: Evidence of pan-transmetatarsal amputation reidentified. Soft tissue defect is noted at the distal aspect of the stump. An osseous fragment is noted adjacent to the medial cuneiform. This is stable. No new osseous destruction or radiopaque foreign body.  IMPRESSION: Stable evidence of transmetatarsal amputation with soft tissue  defect at the distal aspect of the stump.   Electronically Signed   By: Conchita Paris M.D.   On: 04/11/2014 18:09    Microbiology: Recent Results (from the past 240 hour(s))  URINE CULTURE     Status: None   Collection Time    04/11/14  6:32 PM      Result Value Ref Range Status   Specimen Description URINE, CLEAN CATCH   Final   Special Requests Normal   Final   Culture  Setup Time     Final   Value: 04/12/2014 02:50     Performed at [place]     Final   Value: 50,000 COLONIES/ML     Performed at [facility]   Culture     Final   Value: Multiple bacterial morphotypes present, none predominant. Suggest appropriate recollection if clinically indicated.     Performed at [facility]   Report Status 04/13/2014 FINAL   Final  MRSA PCR SCREENING     Status: None   Collection Time    04/12/14  3:57 AM      Result Value Ref Range Status   MRSA by PCR NEGATIVE  NEGATIVE Final   Comment:            The GeneXpert MRSA Assay (FDA     approved for NASAL specimens     only), is one component of a     comprehensive MRSA colonization     surveillance program. It is not     intended to diagnose MRSA     infection nor to guide or     monitor treatment for     MRSA infections.     Labs: Basic Metabolic Panel:  Recent Labs Lab 04/11/14 1658 04/11/14 2344 04/12/14 0515 04/13/14 0424 04/14/14 0441  NA 132* 134* 133* 131* 134*  K 3.8 3.5* 3.3* 4.0 4.6  CL 98 102 99 103 104  CO2 19 17* 18* 17* 18*  GLUCOSE 574* 214* 419* 137* 152*  BUN 28* 26* 26* 33* 39*  CREATININE 2.37* 2.20* 2.43*  2.67* 2.95*  CALCIUM 8.3* 8.3* 8.3* 7.9* 8.4   Liver Function Tests:  Recent Labs Lab 04/11/14 1658 04/12/14 0515  AST 19 20  ALT 22 21  ALKPHOS 91 81  BILITOT <0.2* <0.2*  PROT 6.7 6.7  ALBUMIN 1.8* 1.8*   No results found for this basename: LIPASE, AMYLASE,  in the last 168 hours No results found for this basename: AMMONIA,  in the last 168  hours CBC:  Recent Labs Lab 04/11/14 1658 04/12/14 0515 04/13/14 0424 04/14/14 0441  WBC 4.3 3.7* 3.8* 3.7*  NEUTROABS 2.8  --   --   --   HGB 8.3* 8.1* 6.7* 7.5*  HCT 25.4* 25.8* 20.9* 22.8*  MCV 85.2 86.6 85.7 85.4  PLT 249 228 190 221   Cardiac Enzymes:  Recent Labs Lab 04/11/14 1658 04/11/14 2344  TROPONINI <0.30 <0.30   BNP: BNP (last 3 results)  Recent Labs  02/21/14 0104 02/23/14 0224 04/12/14 0520  PROBNP 930.6* 2305.0* 12801.0*   CBG:  Recent Labs Lab 04/13/14 1618 04/13/14 2022 04/14/14 0004 04/14/14 0438 04/14/14 1148  GLUCAP 223* 234* 165* 133* 167*       Signed:  Roosvelt Churchwell  [facility] 04/14/2014, 12:07 PM

## 2014-04-14 NOTE — Progress Notes (Signed)
Inpatient Diabetes Program Recommendations  AACE/ADA: New Consensus Statement on Inpatient Glycemic Control (2013)  Target Ranges:  Prepandial:   less than 140 mg/dL      Peak postprandial:   less than 180 mg/dL (1-2 hours)      Critically ill patients:  140 - 180 mg/dL     Results for VALANDA, GIRDLER (MRN [id number]) as of 04/14/2014 08:34  Ref. Range 04/12/2014 23:54 04/13/2014 05:23 04/13/2014 07:23 04/13/2014 12:04 04/13/2014 16:18 04/13/2014 20:22  Glucose-Capillary Latest Range: 70-99 mg/dL 358 (H) 168 (H) 159 (H) 199 (H) 223 (H) 234 (H)    Results for JASLINE, CASTELLINI (MRN [id number]) as of 04/14/2014 08:34  Ref. Range 04/14/2014 00:04 04/14/2014 04:38  Glucose-Capillary Latest Range: 70-99 mg/dL 165 (H) 133 (H)     Current Orders: Lantus 45 units QHS Novolog Resistant SSI Q4 hours Novolog 3 units tid with meals (Meal Coverage)   Patient eating 100% of meals.  Note that Novolog 3 units tidwc added last evening to insulin regimen.    MD- Please change Novolog Resistant SSI to tid ac + HS now that patient eating well (currently ordered as Q4 hour coverage).  Thanks!   Will follow Wyn Quaker RN, MSN, CDE Diabetes Coordinator Inpatient Diabetes Program Team Pager: [phone] (8a-10p)

## 2014-04-15 ENCOUNTER — Telehealth: Payer: Self-pay

## 2014-04-15 ENCOUNTER — Telehealth: Payer: Self-pay | Admitting: Emergency Medicine

## 2014-04-15 NOTE — Telephone Encounter (Signed)
[facility] Clinic Post-discharge Follow-Up Phone Call:  Date of Discharge: 04/14/2014 Principal Discharge Diagnosis: Cellulitis of left leg Reason for Chronic Case Management: f/u Post-discharge Communication: (Clearly document all attempts clearly and date contact made)  Call Completed:  N With Whom: (Patient, caregiver): Unable to reach the pt, spoke with pt's aunt and she reported that pt spoke with pt this morning but she does not know pt where about at this time. Pt's aunt given clinic call back number [phone], to give to the pt if she call back again.

## 2014-04-15 NOTE — Telephone Encounter (Signed)
[facility]itional [facility] Clinic Post-discharge Follow-Up Phone Call:  Date of Discharge: 04/14/14 Principal Discharge Diagnosis: Cellulitis of left leg Reason for Chronic Case Management: Uncontrolled diabetes mellitus-type 2, Stage 4 chronic kidney disease, hypertension. Patient has had 4 inpatient admissions in the last year, 6 ED visits in the last 6 months.  Post-discharge Communication: Patient called x2 (once by this RN and once by Carleene Overlie, RN). Called patient's emergency contact (patient's phone number not in service) once again but unable to reach patient. Left voicemail requesting return call. Awaiting return call.

## 2014-04-20 ENCOUNTER — Inpatient Hospital Stay: Payer: Self-pay

## 2014-04-20 ENCOUNTER — Telehealth: Payer: Self-pay | Admitting: Emergency Medicine

## 2014-04-20 NOTE — Telephone Encounter (Signed)
Transitional Care Clinic Post-discharge Follow-Up Phone Call:  Date of Discharge: 04/14/2014 Principal Discharge Diagnosis: Date of Discharge: 04/14/14  Principal Discharge Diagnosis: Cellulitis of left leg  Reason for Chronic Case Management: Uncontrolled diabetes mellitus-type 2, Stage 4 chronic kidney disease, hypertension. Patient has had 4 inpatient admissions in the last year, 6 ED visits in the last 6 months. Unable to reach patient to check reason for no show for appointment. Left a messages for pt to call back.

## 2014-04-22 ENCOUNTER — Inpatient Hospital Stay (HOSPITAL_COMMUNITY): Payer: Medicare Other

## 2014-04-22 ENCOUNTER — Emergency Department (HOSPITAL_COMMUNITY): Payer: Medicare Other

## 2014-04-22 ENCOUNTER — Encounter (HOSPITAL_COMMUNITY): Payer: Self-pay | Admitting: Emergency Medicine

## 2014-04-22 ENCOUNTER — Inpatient Hospital Stay (HOSPITAL_COMMUNITY)
Admission: EM | Admit: 2014-04-22 | Discharge: 2014-04-25 | DRG: 638 | Disposition: A | Discharge status: Skilled Nursing Facility | Payer: Medicare Other | Attending: Internal Medicine | Admitting: Internal Medicine

## 2014-04-22 DIAGNOSIS — L03119 Cellulitis of unspecified part of limb: Secondary | ICD-10-CM

## 2014-04-22 DIAGNOSIS — R0609 Other forms of dyspnea: Secondary | ICD-10-CM

## 2014-04-22 DIAGNOSIS — Z8673 Personal history of transient ischemic attack (TIA), and cerebral infarction without residual deficits: Secondary | ICD-10-CM | POA: Diagnosis not present

## 2014-04-22 DIAGNOSIS — I129 Hypertensive chronic kidney disease with stage 1 through stage 4 chronic kidney disease, or unspecified chronic kidney disease: Secondary | ICD-10-CM | POA: Diagnosis present

## 2014-04-22 DIAGNOSIS — F17200 Nicotine dependence, unspecified, uncomplicated: Secondary | ICD-10-CM | POA: Diagnosis present

## 2014-04-22 DIAGNOSIS — Z8614 Personal history of Methicillin resistant Staphylococcus aureus infection: Secondary | ICD-10-CM

## 2014-04-22 DIAGNOSIS — Z531 Procedure and treatment not carried out because of patient's decision for reasons of belief and group pressure: Secondary | ICD-10-CM

## 2014-04-22 DIAGNOSIS — Z794 Long term (current) use of insulin: Secondary | ICD-10-CM | POA: Diagnosis not present

## 2014-04-22 DIAGNOSIS — E1165 Type 2 diabetes mellitus with hyperglycemia: Secondary | ICD-10-CM | POA: Diagnosis present

## 2014-04-22 DIAGNOSIS — S98919A Complete traumatic amputation of unspecified foot, level unspecified, initial encounter: Secondary | ICD-10-CM | POA: Diagnosis not present

## 2014-04-22 DIAGNOSIS — N390 Urinary tract infection, site not specified: Secondary | ICD-10-CM

## 2014-04-22 DIAGNOSIS — E1149 Type 2 diabetes mellitus with other diabetic neurological complication: Secondary | ICD-10-CM | POA: Diagnosis present

## 2014-04-22 DIAGNOSIS — N189 Chronic kidney disease, unspecified: Secondary | ICD-10-CM

## 2014-04-22 DIAGNOSIS — K921 Melena: Secondary | ICD-10-CM | POA: Diagnosis present

## 2014-04-22 DIAGNOSIS — R5381 Other malaise: Secondary | ICD-10-CM | POA: Diagnosis present

## 2014-04-22 DIAGNOSIS — L84 Corns and callosities: Secondary | ICD-10-CM | POA: Diagnosis present

## 2014-04-22 DIAGNOSIS — I509 Heart failure, unspecified: Secondary | ICD-10-CM | POA: Diagnosis present

## 2014-04-22 DIAGNOSIS — E871 Hypo-osmolality and hyponatremia: Secondary | ICD-10-CM | POA: Diagnosis present

## 2014-04-22 DIAGNOSIS — E1169 Type 2 diabetes mellitus with other specified complication: Secondary | ICD-10-CM | POA: Diagnosis not present

## 2014-04-22 DIAGNOSIS — E872 Acidosis, unspecified: Secondary | ICD-10-CM | POA: Diagnosis present

## 2014-04-22 DIAGNOSIS — B1920 Unspecified viral hepatitis C without hepatic coma: Secondary | ICD-10-CM | POA: Diagnosis present

## 2014-04-22 DIAGNOSIS — M7989 Other specified soft tissue disorders: Secondary | ICD-10-CM

## 2014-04-22 DIAGNOSIS — F259 Schizoaffective disorder, unspecified: Secondary | ICD-10-CM | POA: Diagnosis present

## 2014-04-22 DIAGNOSIS — G40909 Epilepsy, unspecified, not intractable, without status epilepticus: Secondary | ICD-10-CM

## 2014-04-22 DIAGNOSIS — E11621 Type 2 diabetes mellitus with foot ulcer: Secondary | ICD-10-CM

## 2014-04-22 DIAGNOSIS — L02619 Cutaneous abscess of unspecified foot: Secondary | ICD-10-CM | POA: Diagnosis present

## 2014-04-22 DIAGNOSIS — N058 Unspecified nephritic syndrome with other morphologic changes: Secondary | ICD-10-CM | POA: Diagnosis present

## 2014-04-22 DIAGNOSIS — L03116 Cellulitis of left lower limb: Secondary | ICD-10-CM

## 2014-04-22 DIAGNOSIS — F319 Bipolar disorder, unspecified: Secondary | ICD-10-CM | POA: Diagnosis present

## 2014-04-22 DIAGNOSIS — Z9119 Patient's noncompliance with other medical treatment and regimen: Secondary | ICD-10-CM

## 2014-04-22 DIAGNOSIS — E11628 Type 2 diabetes mellitus with other skin complications: Secondary | ICD-10-CM | POA: Diagnosis present

## 2014-04-22 DIAGNOSIS — D649 Anemia, unspecified: Secondary | ICD-10-CM

## 2014-04-22 DIAGNOSIS — N184 Chronic kidney disease, stage 4 (severe): Secondary | ICD-10-CM

## 2014-04-22 DIAGNOSIS — N183 Chronic kidney disease, stage 3 unspecified: Secondary | ICD-10-CM | POA: Diagnosis present

## 2014-04-22 DIAGNOSIS — D509 Iron deficiency anemia, unspecified: Secondary | ICD-10-CM | POA: Diagnosis present

## 2014-04-22 DIAGNOSIS — A5901 Trichomonal vulvovaginitis: Secondary | ICD-10-CM | POA: Diagnosis present

## 2014-04-22 DIAGNOSIS — D638 Anemia in other chronic diseases classified elsewhere: Secondary | ICD-10-CM | POA: Diagnosis present

## 2014-04-22 DIAGNOSIS — Z79899 Other long term (current) drug therapy: Secondary | ICD-10-CM | POA: Diagnosis not present

## 2014-04-22 DIAGNOSIS — D62 Acute posthemorrhagic anemia: Secondary | ICD-10-CM | POA: Diagnosis present

## 2014-04-22 DIAGNOSIS — E1142 Type 2 diabetes mellitus with diabetic polyneuropathy: Secondary | ICD-10-CM | POA: Diagnosis present

## 2014-04-22 DIAGNOSIS — S42309D Unspecified fracture of shaft of humerus, unspecified arm, subsequent encounter for fracture with routine healing: Secondary | ICD-10-CM

## 2014-04-22 DIAGNOSIS — K219 Gastro-esophageal reflux disease without esophagitis: Secondary | ICD-10-CM | POA: Diagnosis present

## 2014-04-22 DIAGNOSIS — N179 Acute kidney failure, unspecified: Secondary | ICD-10-CM | POA: Diagnosis present

## 2014-04-22 DIAGNOSIS — IMO0002 Reserved for concepts with insufficient information to code with codable children: Secondary | ICD-10-CM | POA: Diagnosis present

## 2014-04-22 DIAGNOSIS — I10 Essential (primary) hypertension: Secondary | ICD-10-CM | POA: Diagnosis not present

## 2014-04-22 DIAGNOSIS — E1159 Type 2 diabetes mellitus with other circulatory complications: Secondary | ICD-10-CM

## 2014-04-22 DIAGNOSIS — I5032 Chronic diastolic (congestive) heart failure: Secondary | ICD-10-CM | POA: Diagnosis present

## 2014-04-22 DIAGNOSIS — K625 Hemorrhage of anus and rectum: Secondary | ICD-10-CM | POA: Diagnosis present

## 2014-04-22 DIAGNOSIS — Z91199 Patient's noncompliance with other medical treatment and regimen due to unspecified reason: Secondary | ICD-10-CM

## 2014-04-22 DIAGNOSIS — L97509 Non-pressure chronic ulcer of other part of unspecified foot with unspecified severity: Secondary | ICD-10-CM | POA: Diagnosis present

## 2014-04-22 DIAGNOSIS — R609 Edema, unspecified: Secondary | ICD-10-CM | POA: Diagnosis not present

## 2014-04-22 DIAGNOSIS — E1100 Type 2 diabetes mellitus with hyperosmolarity without nonketotic hyperglycemic-hyperosmolar coma (NKHHC): Secondary | ICD-10-CM | POA: Diagnosis present

## 2014-04-22 DIAGNOSIS — E662 Morbid (severe) obesity with alveolar hypoventilation: Secondary | ICD-10-CM | POA: Diagnosis present

## 2014-04-22 DIAGNOSIS — E1122 Type 2 diabetes mellitus with diabetic chronic kidney disease: Secondary | ICD-10-CM

## 2014-04-22 DIAGNOSIS — H409 Unspecified glaucoma: Secondary | ICD-10-CM | POA: Diagnosis present

## 2014-04-22 DIAGNOSIS — E1129 Type 2 diabetes mellitus with other diabetic kidney complication: Secondary | ICD-10-CM | POA: Diagnosis present

## 2014-04-22 DIAGNOSIS — M79609 Pain in unspecified limb: Secondary | ICD-10-CM

## 2014-04-22 DIAGNOSIS — Z23 Encounter for immunization: Secondary | ICD-10-CM | POA: Diagnosis not present

## 2014-04-22 DIAGNOSIS — L089 Local infection of the skin and subcutaneous tissue, unspecified: Secondary | ICD-10-CM | POA: Diagnosis present

## 2014-04-22 DIAGNOSIS — I319 Disease of pericardium, unspecified: Secondary | ICD-10-CM

## 2014-04-22 DIAGNOSIS — R0989 Other specified symptoms and signs involving the circulatory and respiratory systems: Secondary | ICD-10-CM

## 2014-04-22 DIAGNOSIS — B182 Chronic viral hepatitis C: Secondary | ICD-10-CM

## 2014-04-22 DIAGNOSIS — L97529 Non-pressure chronic ulcer of other part of left foot with unspecified severity: Secondary | ICD-10-CM

## 2014-04-22 DIAGNOSIS — R739 Hyperglycemia, unspecified: Secondary | ICD-10-CM

## 2014-04-22 LAB — CBG MONITORING, ED
GLUCOSE-CAPILLARY: 430 mg/dL — AB (ref 70–99)
Glucose-Capillary: 417 mg/dL — ABNORMAL HIGH (ref 70–99)

## 2014-04-22 LAB — CBC WITH DIFFERENTIAL/PLATELET
Basophils Absolute: 0 10*3/uL (ref 0.0–0.1)
Basophils Relative: 0 % (ref 0–1)
Eosinophils Absolute: 0.1 10*3/uL (ref 0.0–0.7)
Eosinophils Relative: 1 % (ref 0–5)
HCT: 18.8 % — ABNORMAL LOW (ref 36.0–46.0)
HEMOGLOBIN: 6.2 g/dL — AB (ref 12.0–15.0)
LYMPHS ABS: 1.4 10*3/uL (ref 0.7–4.0)
LYMPHS PCT: 24 % (ref 12–46)
MCH: 28.4 pg (ref 26.0–34.0)
MCHC: 33 g/dL (ref 30.0–36.0)
MCV: 86.2 fL (ref 78.0–100.0)
MONOS PCT: 6 % (ref 3–12)
Monocytes Absolute: 0.3 10*3/uL (ref 0.1–1.0)
NEUTROS PCT: 69 % (ref 43–77)
Neutro Abs: 4.1 10*3/uL (ref 1.7–7.7)
Platelets: 265 10*3/uL (ref 150–400)
RBC: 2.18 MIL/uL — AB (ref 3.87–5.11)
RDW: 16 % — ABNORMAL HIGH (ref 11.5–15.5)
WBC: 5.9 10*3/uL (ref 4.0–10.5)

## 2014-04-22 LAB — NO BLOOD PRODUCTS

## 2014-04-22 LAB — RETICULOCYTES
RBC.: 2.39 MIL/uL — AB (ref 3.87–5.11)
RETIC COUNT ABSOLUTE: 119.5 10*3/uL (ref 19.0–186.0)
Retic Ct Pct: 5 % — ABNORMAL HIGH (ref 0.4–3.1)

## 2014-04-22 LAB — BASIC METABOLIC PANEL
Anion gap: 11 (ref 5–15)
BUN: 46 mg/dL — AB (ref 6–23)
CHLORIDE: 95 meq/L — AB (ref 96–112)
CO2: 19 mEq/L (ref 19–32)
Calcium: 8.4 mg/dL (ref 8.4–10.5)
Creatinine, Ser: 2.2 mg/dL — ABNORMAL HIGH (ref 0.50–1.10)
GFR calc Af Amer: 29 mL/min — ABNORMAL LOW (ref >90)
GFR calc non Af Amer: 25 mL/min — ABNORMAL LOW (ref >90)
Glucose, Bld: 447 mg/dL — ABNORMAL HIGH (ref 70–99)
POTASSIUM: 3.9 meq/L (ref 3.7–5.3)
Sodium: 125 mEq/L — ABNORMAL LOW (ref 137–147)

## 2014-04-22 LAB — URINE MICROSCOPIC-ADD ON

## 2014-04-22 LAB — GLUCOSE, RANDOM: Glucose, Bld: 489 mg/dL — ABNORMAL HIGH (ref 70–99)

## 2014-04-22 LAB — HEPATIC FUNCTION PANEL
ALT: 24 U/L (ref 0–35)
AST: 24 U/L (ref 0–37)
Albumin: 2.1 g/dL — ABNORMAL LOW (ref 3.5–5.2)
Alkaline Phosphatase: 101 U/L (ref 39–117)
Total Bilirubin: 0.2 mg/dL — ABNORMAL LOW (ref 0.3–1.2)
Total Protein: 7.2 g/dL (ref 6.0–8.3)

## 2014-04-22 LAB — URINALYSIS, ROUTINE W REFLEX MICROSCOPIC
Bilirubin Urine: NEGATIVE
Ketones, ur: NEGATIVE mg/dL
Nitrite: NEGATIVE
Protein, ur: >300 mg/dL — AB
Specific Gravity, Urine: 1.015 (ref 1.005–1.030)
UROBILINOGEN UA: 0.2 mg/dL (ref 0.0–1.0)
pH: 6 (ref 5.0–8.0)

## 2014-04-22 LAB — C-REACTIVE PROTEIN: CRP: <0.5 mg/dL — ABNORMAL LOW (ref <0.60)

## 2014-04-22 LAB — POC OCCULT BLOOD, ED: FECAL OCCULT BLD: POSITIVE — AB

## 2014-04-22 LAB — SEDIMENTATION RATE: SED RATE: 88 mm/h — AB (ref 0–22)

## 2014-04-22 LAB — GLUCOSE, CAPILLARY
GLUCOSE-CAPILLARY: 119 mg/dL — AB (ref 70–99)
Glucose-Capillary: 450 mg/dL — ABNORMAL HIGH (ref 70–99)

## 2014-04-22 MED ORDER — ACETAMINOPHEN 325 MG PO TABS
650.0000 mg | ORAL_TABLET | Freq: Four times a day (QID) | ORAL | Status: DC | PRN
  Administered 2014-04-22 – 2014-04-23 (×2): 650 mg via ORAL
  Filled 2014-04-22 (×2): qty 2

## 2014-04-22 MED ORDER — LEVETIRACETAM 500 MG PO TABS
500.0000 mg | ORAL_TABLET | Freq: Two times a day (BID) | ORAL | Status: DC
  Administered 2014-04-22 – 2014-04-25 (×7): 500 mg via ORAL
  Filled 2014-04-22 (×8): qty 1

## 2014-04-22 MED ORDER — INSULIN ASPART 100 UNIT/ML ~~LOC~~ SOLN
0.0000 [IU] | Freq: Every day | SUBCUTANEOUS | Status: DC
  Administered 2014-04-23: 2 [IU] via SUBCUTANEOUS
  Administered 2014-04-24: 3 [IU] via SUBCUTANEOUS

## 2014-04-22 MED ORDER — INSULIN ASPART 100 UNIT/ML ~~LOC~~ SOLN: 0.0000 [IU] | Freq: Three times a day (TID) | SUBCUTANEOUS | Status: DC

## 2014-04-22 MED ORDER — FERROUS SULFATE 325 (65 FE) MG PO TABS
325.0000 mg | ORAL_TABLET | Freq: Three times a day (TID) | ORAL | Status: DC
  Administered 2014-04-22 – 2014-04-25 (×10): 325 mg via ORAL
  Filled 2014-04-22 (×11): qty 1

## 2014-04-22 MED ORDER — SODIUM CHLORIDE 0.9 % IV SOLN
INTRAVENOUS | Status: AC
  Administered 2014-04-22: 10:00:00 via INTRAVENOUS

## 2014-04-22 MED ORDER — PREGABALIN 75 MG PO CAPS
75.0000 mg | ORAL_CAPSULE | Freq: Two times a day (BID) | ORAL | Status: DC
  Administered 2014-04-22 – 2014-04-25 (×7): 75 mg via ORAL
  Filled 2014-04-22 (×7): qty 1

## 2014-04-22 MED ORDER — POLYETHYLENE GLYCOL 3350 17 G PO PACK
17.0000 g | PACK | Freq: Two times a day (BID) | ORAL | Status: DC
  Administered 2014-04-22 – 2014-04-25 (×7): 17 g via ORAL
  Filled 2014-04-22 (×8): qty 1

## 2014-04-22 MED ORDER — INSULIN ASPART 100 UNIT/ML ~~LOC~~ SOLN: 0.0000 [IU] | Freq: Every day | SUBCUTANEOUS | Status: DC

## 2014-04-22 MED ORDER — ONDANSETRON HCL 4 MG/2ML IJ SOLN
4.0000 mg | Freq: Four times a day (QID) | INTRAMUSCULAR | Status: DC | PRN
  Administered 2014-04-24: 4 mg via INTRAVENOUS
  Filled 2014-04-22: qty 2

## 2014-04-22 MED ORDER — ACETAMINOPHEN 650 MG RE SUPP: 650.0000 mg | Freq: Four times a day (QID) | RECTAL | Status: DC | PRN

## 2014-04-22 MED ORDER — CLINDAMYCIN PHOSPHATE 900 MG/50ML IV SOLN: 900.0000 mg | Freq: Once | INTRAVENOUS | Status: DC

## 2014-04-22 MED ORDER — INFLUENZA VAC SPLIT QUAD 0.5 ML IM SUSY
0.5000 mL | PREFILLED_SYRINGE | INTRAMUSCULAR | Status: AC
  Administered 2014-04-23: 0.5 mL via INTRAMUSCULAR
  Filled 2014-04-22 (×2): qty 0.5

## 2014-04-22 MED ORDER — FOLIC ACID 0.5 MG HALF TAB
500.0000 ug | ORAL_TABLET | Freq: Every morning | ORAL | Status: DC
  Administered 2014-04-22 – 2014-04-25 (×4): 0.5 mg via ORAL
  Filled 2014-04-22 (×4): qty 1

## 2014-04-22 MED ORDER — INSULIN ASPART 100 UNIT/ML ~~LOC~~ SOLN
26.0000 [IU] | Freq: Once | SUBCUTANEOUS | Status: AC
  Administered 2014-04-22: 18:00:00 via SUBCUTANEOUS

## 2014-04-22 MED ORDER — VANCOMYCIN HCL IN DEXTROSE 1-5 GM/200ML-% IV SOLN
1000.0000 mg | Freq: Once | INTRAVENOUS | Status: AC
  Administered 2014-04-22: 1000 mg via INTRAVENOUS
  Filled 2014-04-22: qty 200

## 2014-04-22 MED ORDER — INSULIN ASPART 100 UNIT/ML ~~LOC~~ SOLN
0.0000 [IU] | Freq: Three times a day (TID) | SUBCUTANEOUS | Status: DC
  Administered 2014-04-23: 3 [IU] via SUBCUTANEOUS
  Administered 2014-04-23: 4 [IU] via SUBCUTANEOUS
  Administered 2014-04-23 – 2014-04-24 (×2): 15 [IU] via SUBCUTANEOUS
  Administered 2014-04-24 – 2014-04-25 (×2): 3 [IU] via SUBCUTANEOUS
  Administered 2014-04-25 (×2): 7 [IU] via SUBCUTANEOUS

## 2014-04-22 MED ORDER — HYDROCODONE-ACETAMINOPHEN 5-325 MG PO TABS
2.0000 | ORAL_TABLET | Freq: Once | ORAL | Status: AC
  Administered 2014-04-22: 2 via ORAL
  Filled 2014-04-22: qty 2

## 2014-04-22 MED ORDER — CARVEDILOL 25 MG PO TABS
25.0000 mg | ORAL_TABLET | Freq: Two times a day (BID) | ORAL | Status: DC
  Administered 2014-04-22 – 2014-04-25 (×7): 25 mg via ORAL
  Filled 2014-04-22 (×8): qty 1

## 2014-04-22 MED ORDER — INSULIN GLARGINE 100 UNIT/ML ~~LOC~~ SOLN
45.0000 [IU] | Freq: Every day | SUBCUTANEOUS | Status: DC
  Administered 2014-04-22 – 2014-04-24 (×3): 45 [IU] via SUBCUTANEOUS
  Filled 2014-04-22 (×4): qty 0.45

## 2014-04-22 MED ORDER — SIMETHICONE 80 MG PO CHEW
80.0000 mg | CHEWABLE_TABLET | Freq: Three times a day (TID) | ORAL | Status: DC | PRN
  Filled 2014-04-22: qty 1

## 2014-04-22 MED ORDER — DEXTROSE 5 % IV SOLN
2.0000 g | INTRAVENOUS | Status: DC
  Administered 2014-04-22 – 2014-04-24 (×3): 2 g via INTRAVENOUS
  Filled 2014-04-22 (×4): qty 2

## 2014-04-22 MED ORDER — METRONIDAZOLE 500 MG PO TABS
500.0000 mg | ORAL_TABLET | Freq: Two times a day (BID) | ORAL | Status: DC
  Administered 2014-04-22 – 2014-04-25 (×7): 500 mg via ORAL
  Filled 2014-04-22 (×8): qty 1

## 2014-04-22 MED ORDER — FUROSEMIDE 20 MG PO TABS
20.0000 mg | ORAL_TABLET | Freq: Every morning | ORAL | Status: DC
  Administered 2014-04-22 – 2014-04-25 (×4): 20 mg via ORAL
  Filled 2014-04-22 (×4): qty 1

## 2014-04-22 MED ORDER — ONDANSETRON HCL 4 MG PO TABS: 4.0000 mg | ORAL_TABLET | Freq: Four times a day (QID) | ORAL | Status: DC | PRN

## 2014-04-22 MED ORDER — DULOXETINE HCL 60 MG PO CPEP
60.0000 mg | ORAL_CAPSULE | Freq: Every day | ORAL | Status: DC
  Administered 2014-04-22 – 2014-04-25 (×4): 60 mg via ORAL
  Filled 2014-04-22 (×4): qty 1

## 2014-04-22 NOTE — Progress Notes (Signed)
*  Preliminary Results* Left lower extremity venous duplex completed. Left lower extremity is negative for deep vein thrombosis. There is no evidence of left Baker's cyst.  04/22/2014 10:32 AM  Maudry Mayhew, RVT, RDCS, RDMS

## 2014-04-22 NOTE — ED Notes (Signed)
Went in patient room patient has disconnect the IV and turned the medication off.  Nurse made aware.

## 2014-04-22 NOTE — ED Notes (Signed)
Type and Screen not drawn due to patient refusing blood products s/t religious beliefs. Pt states that she does not want blood products administered to her even though her physician beliefs it would improve her condition.

## 2014-04-22 NOTE — Progress Notes (Signed)
ANTIBIOTIC CONSULT NOTE - INITIAL  Pharmacy Consult for Cefepime Indication: wound infection / UTI  Allergies  Allergen Reactions  . Gabapentin Itching and Swelling  . Saphris [Asenapine] Swelling    Swelling of the face  . Tramadol Swelling    Causes legs to swell  . Penicillins Hives    Has tolerated Cefepime and Ceftriaxone.    Patient Measurements:    Vital Signs: Temp: 98.7 F (37.1 C) (09/18 0539) Temp src: Oral (09/18 0539) BP: 207/103 mmHg (09/18 0539) Pulse Rate: 83 (09/18 0539) Intake/Output from previous day:   Intake/Output from this shift:    Labs:  Recent Labs  04/22/14 0731  WBC 5.9  HGB 6.2*  PLT 265  CREATININE 2.20*   The CrCl is unknown because both a height and weight (above a minimum accepted value) are required for this calculation. No results found for this basename: VANCOTROUGH, Corlis Leak, VANCORANDOM, GENTTROUGH, GENTPEAK, GENTRANDOM, TOBRATROUGH, TOBRAPEAK, TOBRARND, AMIKACINPEAK, AMIKACINTROU, AMIKACIN,  in the last 72 hours   Microbiology: Recent Results (from the past 720 hour(s))  URINE CULTURE     Status: None   Collection Time    04/11/14  6:32 PM      Result Value Ref Range Status   Specimen Description URINE, CLEAN CATCH   Final   Special Requests Normal   Final   Culture  Setup Time     Final   Value: 04/12/2014 02:50     Performed at [place]     Final   Value: 50,000 COLONIES/ML     Performed at [facility]   Culture     Final   Value: Multiple bacterial morphotypes present, none predominant. Suggest appropriate recollection if clinically indicated.     Performed at [facility]   Report Status 04/13/2014 FINAL   Final  MRSA PCR SCREENING     Status: None   Collection Time    04/12/14  3:57 AM      Result Value Ref Range Status   MRSA by PCR NEGATIVE  NEGATIVE Final   Comment:            The GeneXpert MRSA Assay (FDA     approved for NASAL specimens     only), is one  component of a     comprehensive MRSA colonization     surveillance program. It is not     intended to diagnose MRSA     infection nor to guide or     monitor treatment for     MRSA infections.    Medical History: Past Medical History  Diagnosis Date  . Neuropathy   . Glaucoma   . Bipolar 1 disorder   . CHF (congestive heart failure)   . Refusal of blood transfusions as patient is Jehovah's Witness   . Hypertension   . Shortness of breath   . Diabetes mellitus     INSULIN DEPENDENT  . TIA (transient ischemic attack)   . Neuromuscular disorder     DIABETIC NEUROPATHY  . Chronic kidney disease   . Hepatitis C virus   . Seizure     diagnosed 2014  . MRSA infection   . Bipolar disorder, unspecified 02/06/2014  . Schizoaffective disorder, unspecified condition 02/08/2014     Assessment: 79 yoF with PMH of DM, neuropathy, CKD, CHF, HTN and foot amputation. Was admitted 9/7 for diabetic foot ulcer and cellulitis of left foot, discharged on 9/10 on antibiotics. Pt did not  fill antibiotics. Now presents with increased pain and swelling in left lower leg. Pharmacy consulted to dose Cefepime for wound infection / UTI.   Tmax: afebrile WBCs: WNL Renal: SCr 2.2 CrCl 35 N  9/18 urine: sent   Goal of Therapy:  Appropriate antibiotic dosing for renal function; eradication of infection  Plan:  Start Cefepime 2g IV Q24H Follow Scr, adjust for CrCl  Kizzie Furnish, PharmD Pager: [phone] 04/22/2014 9:22 AM

## 2014-04-22 NOTE — ED Notes (Signed)
Pt ambulated to restroom. 

## 2014-04-22 NOTE — Progress Notes (Signed)
[facility]  2D Echocardiogram completed.  [place], RDCS 04/22/2014 2:15 PM

## 2014-04-22 NOTE — Consult Note (Signed)
UNASSIGNED PATIENT Reason for Consult: Anemia with rectal bleeding. Referring Physician: Dr. Shanon Brow Tat.  [name] is an 48 y.o. female.  HPI: 48 year old black female, with multiple medical issues listed below, readmitted with a diabetic foot ulcer and anemia-hemoglobin 6.2 gm/dl. She was discharged from the hospital on 04/14/14 on antibiotics but she never followed any of the medical recommendations made at that time. She has a longstanding history of reflux with intermittent nausea and vomiting :when she overeats". She has never been diagnosed with gastroparesis. She has been having worsening lower extremity pain and swelling with ?chills and fevers. She was found to have a blood glucose of 447 on admission with a normal anion gap. She gives a history of intermittent hematochezia with hard stools. She had a colonoscopy done by Dr. Clarene Essex last year when she was noted to have internal and external hemorrhoids. The exam was otherwise unremarkable; there was a lot of debris in the colon. She also has a right humeral fracture for which she did not follow up with her orthopedist. She is a Restaurant manager, fast food.  Past Medical History  Diagnosis Date  .    Marland Kitchen Glaucoma   . Bipolar 1 disorder   . CHF (congestive heart failure)   . Refusal of blood transfusions as patient is Jehovah's Witness   . Hypertension   . Shortness of breath   . Diabetes mellitus     INSULIN DEPENDENT  . TIA (transient ischemic attack)   .      DIABETIC NEUROPATHY  . Chronic kidney disease   . Hepatitis C virus   . Seizure     diagnosed 2014  . MRSA infection   . Bipolar disorder, unspecified 02/06/2014  . Schizoaffective disorder, unspecified condition 02/08/2014   Past Surgical History  Procedure Laterality Date  . Abdominal hysterectomy    . Amputation Left 05/21/2013    Procedure: Left Midfoot AMPUTATION;  Surgeon: Newt Minion, MD;  Location: [facility];  Service: Orthopedics;  Laterality: Left;  Left Midfoot  Amputation  . Tubal ligation    . Colonoscopy with propofol N/A 06/22/2013    Procedure: COLONOSCOPY WITH PROPOFOL;  Surgeon: Jeryl Columbia, MD;  Location: [facility];  Service: Endoscopy;  Laterality: N/A;   Family History  Problem Relation Age of Onset  . Hypertension Mother   . Hypertension Father    Social History:  reports that she has been smoking Cigarettes.  She has a 7.5 pack-year smoking history. She has never used smokeless tobacco. She reports that she drinks alcohol. She reports that she does not use illicit drugs.  Allergies:  Allergies  Allergen Reactions  . Gabapentin Itching and Swelling  . Saphris [Asenapine] Swelling    Swelling of the face  . Tramadol Swelling    Causes legs to swell  . Penicillins Hives    Has tolerated Cefepime and Ceftriaxone.   Medications: I have reviewed the patient's current medications.  Results for orders placed during the hospital encounter of 04/22/14 (from the past 48 hour(s))  CBG MONITORING, ED     Status: Abnormal   Collection Time    04/22/14  5:35 AM      Result Value Ref Range   Glucose-Capillary 417 (*) 70 - 99 mg/dL   Comment 1 Notify RN     Comment 2 Orig Pt Id entered as [id number]    URINALYSIS, ROUTINE W REFLEX MICROSCOPIC     Status: Abnormal   Collection Time  04/22/14  6:58 AM      Result Value Ref Range   Color, Urine YELLOW  YELLOW   APPearance CLOUDY (*) CLEAR   Specific Gravity, Urine 1.015  1.005 - 1.030   pH 6.0  5.0 - 8.0   Glucose, UA >1000 (*) NEGATIVE mg/dL   Hgb urine dipstick SMALL (*) NEGATIVE   Bilirubin Urine NEGATIVE  NEGATIVE   Ketones, ur NEGATIVE  NEGATIVE mg/dL   Protein, ur >300 (*) NEGATIVE mg/dL   Urobilinogen, UA 0.2  0.0 - 1.0 mg/dL   Nitrite NEGATIVE  NEGATIVE   Leukocytes, UA MODERATE (*) NEGATIVE  URINE MICROSCOPIC-ADD ON     Status: Abnormal   Collection Time    04/22/14  6:58 AM      Result Value Ref Range   Squamous Epithelial / LPF RARE  RARE   WBC, UA 11-20  <3  WBC/hpf   Bacteria, UA MANY (*) RARE   Urine-Other TRICHOMONAS PRESENT    CBC WITH DIFFERENTIAL     Status: Abnormal   Collection Time    04/22/14  7:31 AM      Result Value Ref Range   WBC 5.9  4.0 - 10.5 K/uL   RBC 2.18 (*) 3.87 - 5.11 MIL/uL   Hemoglobin 6.2 (*) 12.0 - 15.0 g/dL   Comment: REPEATED TO VERIFY     CRITICAL RESULT CALLED TO, READ BACK BY AND VERIFIED WITH:     N. DAVIS RN AT 0805 ON 09.18.15     BY SHUEA   HCT 18.8 (*) 36.0 - 46.0 %   MCV 86.2  78.0 - 100.0 fL   MCH 28.4  26.0 - 34.0 pg   MCHC 33.0  30.0 - 36.0 g/dL   RDW 16.0 (*) 11.5 - 15.5 %   Platelets 265  150 - 400 K/uL   Neutrophils Relative % 69  43 - 77 %   Neutro Abs 4.1  1.7 - 7.7 K/uL   Lymphocytes Relative 24  12 - 46 %   Lymphs Abs 1.4  0.7 - 4.0 K/uL   Monocytes Relative 6  3 - 12 %   Monocytes Absolute 0.3  0.1 - 1.0 K/uL   Eosinophils Relative 1  0 - 5 %   Eosinophils Absolute 0.1  0.0 - 0.7 K/uL   Basophils Relative 0  0 - 1 %   Basophils Absolute 0.0  0.0 - 0.1 K/uL  BASIC METABOLIC PANEL     Status: Abnormal   Collection Time    04/22/14  7:31 AM      Result Value Ref Range   Sodium 125 (*) 137 - 147 mEq/L   Potassium 3.9  3.7 - 5.3 mEq/L   Chloride 95 (*) 96 - 112 mEq/L   CO2 19  19 - 32 mEq/L   Glucose, Bld 447 (*) 70 - 99 mg/dL   BUN 46 (*) 6 - 23 mg/dL   Creatinine, Ser 2.20 (*) 0.50 - 1.10 mg/dL   Calcium 8.4  8.4 - 10.5 mg/dL   GFR calc non Af Amer 25 (*) >90 mL/min   GFR calc Af Amer 29 (*) >90 mL/min   Comment: (NOTE)     The eGFR has been calculated using the CKD EPI equation.     This calculation has not been validated in all clinical situations.     eGFR's persistently <90 mL/min signify possible Chronic Kidney     Disease.   Anion gap 11  5 - 15  SEDIMENTATION RATE     Status: Abnormal   Collection Time    04/22/14  7:31 AM      Result Value Ref Range   Sed Rate 88 (*) 0 - 22 mm/hr  C-REACTIVE PROTEIN     Status: Abnormal   Collection Time    04/22/14  7:31 AM       Result Value Ref Range   CRP <0.5 (*) <0.60 mg/dL   Comment: Performed at [facility] MONITORING, ED     Status: Abnormal   Collection Time    04/22/14  7:50 AM      Result Value Ref Range   Glucose-Capillary 430 (*) 70 - 99 mg/dL  POC OCCULT BLOOD, ED     Status: Abnormal   Collection Time    04/22/14  9:43 AM      Result Value Ref Range   Fecal Occult Bld POSITIVE (*) NEGATIVE  NO BLOOD PRODUCTS     Status: None   Collection Time    04/22/14  1:15 PM      Result Value Ref Range   Transfuse no blood products       Value: TRANSFUSE NO BLOOD PRODUCTS, VERIFIED BY CHASITY HEARN, RN  RETICULOCYTES     Status: Abnormal   Collection Time    04/22/14  2:09 PM      Result Value Ref Range   Retic Ct Pct 5.0 (*) 0.4 - 3.1 %   RBC. 2.39 (*) 3.87 - 5.11 MIL/uL   Retic Count, Manual 119.5  19.0 - 186.0 K/uL  HEPATIC FUNCTION PANEL     Status: Abnormal   Collection Time    04/22/14  2:09 PM      Result Value Ref Range   Total Protein 7.2  6.0 - 8.3 g/dL   Albumin 2.1 (*) 3.5 - 5.2 g/dL   AST 24  0 - 37 U/L   ALT 24  0 - 35 U/L   Alkaline Phosphatase 101  39 - 117 U/L   Total Bilirubin 0.2 (*) 0.3 - 1.2 mg/dL   Bilirubin, Direct <0.2  0.0 - 0.3 mg/dL   Indirect Bilirubin NOT CALCULATED  0.3 - 0.9 mg/dL  GLUCOSE, CAPILLARY     Status: Abnormal   Collection Time    04/22/14  4:44 PM      Result Value Ref Range   Glucose-Capillary 450 (*) 70 - 99 mg/dL  GLUCOSE, RANDOM     Status: Abnormal   Collection Time    04/22/14  5:05 PM      Result Value Ref Range   Glucose, Bld 489 (*) 70 - 99 mg/dL   Dg Humerus Right  04/22/2014   CLINICAL DATA:  Distal humeral fracture.  EXAM: RIGHT HUMERUS - 2+ VIEW  COMPARISON:  April 11, 2014 ; March 07, 2014.  FINDINGS: Continued presence of moderately displaced supracondylar fracture of distal right humerus is noted. Some callus formation is noted, but persistent nonunion remains.  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   Electronically Signed   By: Sabino Dick M.D.   On: 04/22/2014 15:00   Dg Foot Complete Left  04/22/2014   CLINICAL DATA:  Distal left foot ulcer  EXAM: LEFT FOOT - COMPLETE 3+ VIEW  COMPARISON:  04/11/2014  FINDINGS: There again noted changes consistent with transmetatarsal amputation. The overall appearance of the bony structures is stable soft tissue defect is noted overlying the stump similar to  that seen on the prior exam. No definitive bony erosion is seen to suggest osteomyelitis.  IMPRESSION: Postoperative changes as described.  Persistent soft tissue defect is noted distally without definitive osteomyelitis.   Electronically Signed   By: Inez Catalina M.D.   On: 04/22/2014 07:36   Review of Systems  Constitutional: Positive for fever, chills and malaise/fatigue. Negative for weight loss and diaphoresis.  Eyes: Negative.   Respiratory: Negative.   Cardiovascular: Negative.   Gastrointestinal: Positive for heartburn, nausea, vomiting, abdominal pain, constipation and blood in stool.  Musculoskeletal: Positive for back pain and joint pain.  Skin: Negative.   Neurological: Positive for seizures and weakness.  Psychiatric/Behavioral: Positive for depression. The patient is nervous/anxious.    Blood pressure 191/94, pulse 94, temperature 97.9 F (36.6 C), temperature source Oral, resp. rate 20, SpO2 100.00%. Physical Exam  Constitutional: She is oriented to person, place, and time. She appears well-developed and well-nourished.  HENT:  Head: Normocephalic and atraumatic.  Eyes: Conjunctivae and EOM are normal. Pupils are equal, round, and reactive to light.  Neck: Normal range of motion. Neck supple.  Cardiovascular: Normal rate and regular rhythm.   Respiratory: Effort normal and breath sounds normal.  GI: Soft. Bowel sounds are normal. She exhibits no distension and no mass. There is no tenderness. There is no rebound and no  guarding.  Neurological: She is alert and oriented to person, place, and time.  Skin: Skin is warm and dry.  Psychiatric: She has a normal mood and affect. Her behavior is normal. Judgment and thought content normal.   Assessment/Plan: 1) Anemia of chronic disease with rectal bleeding [probabaly due to hemorrhoids]-patient is Jehovah's Witness.  2) Uncontrolled DM with a diabetic foot ulcer-left foot-patient is non-compliant with medical recommendations.  3) GERD-Start PPI's.  4) Hepatitis C.  5) HTN/Stage IV CKD.  6) Bipolar disorder. 7) Seizure disorder.  8) Right distal humeral fracture.  9) Trichomonas vaginitis.  10) NON-COMPLIANT WITH MEDICAL CARE. [name] 04/22/2014, 5:56 PM

## 2014-04-22 NOTE — ED Notes (Signed)
Per EMS pt from home, pt complaining of L foot pain x 2 weeks, pt diabetic, pt has ulcer on L foot, half of foot has been amputated in past.

## 2014-04-22 NOTE — ED Provider Notes (Signed)
CSN: [id number]     Arrival date & time 04/22/14  0522 History   First MD Initiated Contact with Patient 04/22/14 0557     Chief Complaint  Patient presents with  . Foot Pain    left     (Consider location/radiation/quality/duration/timing/severity/associated sxs/prior Treatment) HPI Ms. Kilbourne is a 48 year old female past medical history of diabetes, chronic kidney disease, medical noncompliance who presents to the ER today with 2 weeks of left foot pain. 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 Patient discharged on 04/14/14, and given outpatient antibiotics and encouraged to followup with wound clinic. Patient states she did not fill her antibiotics or call to make an appointment with the wound clinic. Patient states since her discharge, she states her pain has increased and she has noted associated swelling in her left lower leg. She denies fever, chest pain, shortness of breath, abdominal pain, nausea, dysuria. Foot amputation in 05/2013  Past Medical History  Diagnosis Date  . Neuropathy   . Glaucoma   . Bipolar 1 disorder   . CHF (congestive heart failure)   . Refusal of blood transfusions as patient is Jehovah's Witness   . Hypertension   . Shortness of breath   . Diabetes mellitus     INSULIN DEPENDENT  . TIA (transient ischemic attack)   . Neuromuscular disorder     DIABETIC NEUROPATHY  . Chronic kidney disease   . Hepatitis C virus   . Seizure     diagnosed 2014  . MRSA infection   . Bipolar disorder, unspecified 02/06/2014  . Schizoaffective disorder, unspecified condition 02/08/2014   Past Surgical History  Procedure Laterality Date  . Abdominal hysterectomy    . Amputation Left 05/21/2013    Procedure: Left Midfoot AMPUTATION;  Surgeon: Newt Minion, MD;  Location: [facility];  Service: Orthopedics;  Laterality: Left;  Left Midfoot Amputation  . Tubal ligation    . Colonoscopy  with propofol N/A 06/22/2013    Procedure: COLONOSCOPY WITH PROPOFOL;  Surgeon: Jeryl Columbia, MD;  Location: [facility];  Service: Endoscopy;  Laterality: N/A;   Family History  Problem Relation Age of Onset  . Hypertension Mother   . Hypertension Father    History  Substance Use Topics  . Smoking status: Current Every Day Smoker -- 0.25 packs/day for 30 years    Types: Cigarettes  . Smokeless tobacco: Never Used  . Alcohol Use: Yes     Comment: @ 1/week   OB History   Grav Para Term Preterm Abortions TAB SAB Ect Mult Living                 Review of Systems  Constitutional: Negative for fever.  HENT: Negative for trouble swallowing.   Eyes: Negative for visual disturbance.  Respiratory: Negative for shortness of breath.   Cardiovascular: Negative for chest pain.  Gastrointestinal: Negative for nausea, vomiting and abdominal pain.  Genitourinary: Negative for dysuria, frequency and flank pain.  Musculoskeletal: Negative for neck pain.  Skin: Positive for wound. Negative for rash.  Neurological: Negative for dizziness, weakness and numbness.  Psychiatric/Behavioral: Negative.       Allergies  Gabapentin; Saphris; Tramadol; and Penicillins  Home Medications   Prior to Admission medications   Medication Sig Start Date End Date Taking? Authorizing Provider  carvedilol (COREG) 25 MG tablet Take 1 tablet (25 mg total) by mouth 2 (two) times daily.  04/14/14  Yes Nishant Dhungel, MD  cloNIDine (CATAPRES) 0.1 MG tablet Take 0.1 mg by mouth daily.   Yes Historical Provider, MD  DULoxetine (CYMBALTA) 60 MG capsule Take 60 mg by mouth daily.   Yes Historical Provider, MD  ferrous sulfate 325 (65 FE) MG tablet Take 1 tablet (325 mg total) by mouth 3 (three) times daily with meals. 04/14/14  Yes Nishant Dhungel, MD  folic acid (FOLVITE) A999333 MCG tablet Take 400 mcg by mouth every morning.   Yes Historical Provider, MD  furosemide (LASIX) 20 MG tablet Take 20 mg by mouth every  morning.   Yes Historical Provider, MD  insulin aspart (NOVOLOG) 100 UNIT/ML injection Inject 2-10 Units into the skin 3 (three) times daily before meals. SSI: 0-200 = 2 units, 201-250 = 4 units, 251-300 = 6 units, 301-350 = 8 units, 351-400 = 10 units   Yes Historical Provider, MD  insulin glargine (LANTUS) 100 UNIT/ML injection Inject 45 Units into the skin at bedtime.    Yes Historical Provider, MD  levETIRAcetam (KEPPRA) 500 MG tablet Take 500 mg by mouth 2 (two) times daily.   Yes Historical Provider, MD  Multiple Vitamins-Minerals (SENIOR MULTIVITAMIN PLUS PO) Take 1 tablet by mouth every morning.   Yes Historical Provider, MD  polyethylene glycol (MIRALAX / GLYCOLAX) packet Take 17 g by mouth 2 (two) times daily.   Yes Historical Provider, MD  pregabalin (LYRICA) 75 MG capsule Take 75 mg by mouth 2 (two) times daily.   Yes Historical Provider, MD  silver sulfADIAZINE (SILVADENE) 1 % cream Apply topically daily. 04/14/14  Yes Nishant Dhungel, MD  simethicone (MYLICON) 80 MG chewable tablet Chew 80 mg by mouth every 8 (eight) hours as needed for flatulence.   Yes Historical Provider, MD   BP 191/94  Pulse 94  Temp(Src) 97.9 F (36.6 C) (Oral)  Resp 20  SpO2 100% Physical Exam  Nursing note and vitals reviewed. Constitutional: She is oriented to person, place, and time. She appears well-developed and well-nourished. No distress.  HENT:  Head: Normocephalic and atraumatic.  Mouth/Throat: Oropharynx is clear and moist. No oropharyngeal exudate.  Eyes: Right eye exhibits no discharge. Left eye exhibits no discharge. No scleral icterus.  Neck: Normal range of motion.  Cardiovascular: Regular rhythm and normal heart sounds.  Tachycardia present.   No murmur heard. Pulses:      Radial pulses are 2+ on the right side, and 2+ on the left side.       Posterior tibial pulses are 2+ on the right side, and 2+ on the left side.  Rate of 105 on exam  Pulmonary/Chest: Effort normal and breath  sounds normal. No accessory muscle usage. Not tachypneic. No respiratory distress.  Abdominal: Soft. Normal appearance and bowel sounds are normal. There is no tenderness.  Musculoskeletal: Normal range of motion. She exhibits no edema and no tenderness.  Patient has amputation at the area of the distal metatarsals of left foot. Area of approximately 6-8 cm of skin breakdown on plantar aspect of the foot. Sensation intact distally. Defect noted to the distal metatarsals with darkening of the skin and callus formation noted.    Long arm/elbow splint noted on patient's right arm.  Lymphadenopathy:  +2 pitting edema noted to patient's left lower extremity below the knee compared to +1 pitting edema in the right. Patient also has some developing ulcers on the anterior aspect of her left lower extremity.    Neurological: She is alert and oriented to person,  place, and time. No cranial nerve deficit. Coordination normal.  Skin: Skin is warm and dry. No rash noted. She is not diaphoretic.  Psychiatric: She has a normal mood and affect.    ED Course  Procedures (including critical care time) Labs Review Labs Reviewed  CBC WITH DIFFERENTIAL - Abnormal; Notable for the following:    RBC 2.18 (*)    Hemoglobin 6.2 (*)    HCT 18.8 (*)    RDW 16.0 (*)    All other components within normal limits  BASIC METABOLIC PANEL - Abnormal; Notable for the following:    Sodium 125 (*)    Chloride 95 (*)    Glucose, Bld 447 (*)    BUN 46 (*)    Creatinine, Ser 2.20 (*)    GFR calc non Af Amer 25 (*)    GFR calc Af Amer 29 (*)    All other components within normal limits  URINALYSIS, ROUTINE W REFLEX MICROSCOPIC - Abnormal; Notable for the following:    APPearance CLOUDY (*)    Glucose, UA >1000 (*)    Hgb urine dipstick SMALL (*)    Protein, ur >300 (*)    Leukocytes, UA MODERATE (*)    All other components within normal limits  SEDIMENTATION RATE - Abnormal; Notable for the following:    Sed Rate 88  (*)    All other components within normal limits  C-REACTIVE PROTEIN - Abnormal; Notable for the following:    CRP <0.5 (*)    All other components within normal limits  URINE MICROSCOPIC-ADD ON - Abnormal; Notable for the following:    Bacteria, UA MANY (*)    All other components within normal limits  RETICULOCYTES - Abnormal; Notable for the following:    Retic Ct Pct 5.0 (*)    RBC. 2.39 (*)    All other components within normal limits  HEPATIC FUNCTION PANEL - Abnormal; Notable for the following:    Albumin 2.1 (*)    Total Bilirubin 0.2 (*)    All other components within normal limits  GLUCOSE, CAPILLARY - Abnormal; Notable for the following:    Glucose-Capillary 450 (*)    All other components within normal limits  GLUCOSE, RANDOM - Abnormal; Notable for the following:    Glucose, Bld 489 (*)    All other components within normal limits  CBG MONITORING, ED - Abnormal; Notable for the following:    Glucose-Capillary 430 (*)    All other components within normal limits  POC OCCULT BLOOD, ED - Abnormal; Notable for the following:    Fecal Occult Bld POSITIVE (*)    All other components within normal limits  CBG MONITORING, ED - Abnormal; Notable for the following:    Glucose-Capillary 417 (*)    All other components within normal limits  URINE CULTURE  URINE RAPID DRUG SCREEN (HOSP PERFORMED)  FERRITIN  HIV ANTIBODY (ROUTINE TESTING)  BASIC METABOLIC PANEL  CBC  NO BLOOD PRODUCTS    Imaging Review Dg Chest 2 View  04/22/2014   CLINICAL DATA:  Dyspnea on exertion.  EXAM: CHEST  2 VIEW  COMPARISON:  04/11/2014.  FINDINGS: Cardiac silhouette is mildly enlarged. Aorta is mildly uncoiled. No mediastinal or hilar masses. No evidence of adenopathy  Clear lungs.  No pleural effusion or pneumothorax.  Bony thorax is intact.  IMPRESSION: No acute cardiopulmonary disease. No change from the recent prior study.   Electronically Signed   By: Lajean Manes M.D.   On: 04/22/2014  18:13    Dg Humerus Right  04/22/2014   CLINICAL DATA:  Distal humeral fracture.  EXAM: RIGHT HUMERUS - 2+ VIEW  COMPARISON:  April 11, 2014 ; March 07, 2014.  FINDINGS: Continued presence of moderately displaced supracondylar fracture of distal right humerus is noted. Some callus formation is noted, but persistent nonunion remains.  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   Electronically Signed   By: Sabino Dick M.D.   On: 04/22/2014 15:00   Dg Foot Complete Left  04/22/2014   CLINICAL DATA:  Distal left foot ulcer  EXAM: LEFT FOOT - COMPLETE 3+ VIEW  COMPARISON:  04/11/2014  FINDINGS: There again noted changes consistent with transmetatarsal amputation. The overall appearance of the bony structures is stable soft tissue defect is noted overlying the stump similar to that seen on the prior exam. No definitive bony erosion is seen to suggest osteomyelitis.  IMPRESSION: Postoperative changes as described.  Persistent soft tissue defect is noted distally without definitive osteomyelitis.   Electronically Signed   By: Inez Catalina M.D.   On: 04/22/2014 07:36     EKG Interpretation None      MDM   Final diagnoses:  Diabetic foot infection  Urinary tract infection without hematuria, site unspecified  Chronic renal insufficiency, unspecified stage  Anemia in chronic illness  Hyperglycemia  Medical non-compliance  Anemia, iron deficiency  Diabetic ulcer of left foot associated with type 2 diabetes mellitus  Hypertension, accelerated  Stage 4 chronic kidney disease due to type 2 diabetes mellitus   Admitted d/c from inpt on 9/10 for diabetic foot ulcer, cellulitis, back today after she did not f/u with wound clinic or fill antibiotic prescription after being discharged from the hospital. Will workup patient for diabetic foot ulcer, and begin her on IV vancomycin for cellulitis. We'll perform a Doppler ultrasound of her left lower leg for  possible DVT do to unilateral swelling which has been new since her last admission.  Lower extremity Doppler negative for DVT.  Patient anemic at 6.2, down from the low 7's. Patient stating she will refuse blood transfusion do to being Jehovah's Witness. Hemoccult-positive. Patient also has large hemorrhoid noted to 6:00 position of the rectum.  Fecal occult blood positive. Patient noted to have elevated creatinine and BUN, however has chronic renal insufficiency. Urinalysis remarkable for probable urinary tract infection.  [facility] hospitalists consult to and agreed to admit patient for diabetic foot infection, chronic renal disease. Gastroenterology also consult for patients anemia and positive Hemoccult.The patient appears reasonably stabilized for admission considering the current resources, flow, and capabilities available in the ED at this time, and I doubt any other [facility] requiring further screening and/or treatment in the ED prior to admission.  BP 191/94  Pulse 94  Temp(Src) 97.9 F (36.6 C) (Oral)  Resp 20  SpO2 100%   Signed,  [name], PA-C 6:22 PM   This patient seen and discussed with Dr. Lajean Saver, MD   Carrie Mew, PA-C 04/22/14 [phone]

## 2014-04-22 NOTE — Consult Note (Signed)
WOC wound consult note Reason for Consult: Left plantar foot ulcer near transmetatarsal amputation site. Generalized edema lower extremities, patient reports this is some better today.  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 Plantar aspect of foot.  Heavy callous formation near wound edges/periwound Pressure Ulcer POA: Yes Measurement: 4 cm x 7 cm x 0.1 cm with 3 cm callous extending on superior aspect of periwound edge.  Wound bed: Pale pink, dry. Patient reports that is does drain.  Drainage (amount, consistency, odor) None noted.  Periwound: Calloused Dressing procedure/placement/frequency: Dr Sharol Given has been consulted.  Patient states that she would like to go to w wound care center after discharge.  Although noncompliance is noted in most aspects of her care.  Current treatment recommendations: Cleanse left plantar foot ulcer with NS and pat gently dry.  Apply NS moistened silver Hydrofiber (Aquacel AG) to wound bed.  Cover with 4x4 gauze, kerlix and tape.  Change twice weekly.   Will not follow at this time.  Please re-consult if needed.  Domenic Moras RN BSN [place] Pager [phone]

## 2014-04-22 NOTE — H&P (Signed)
History and Physical  [name] [id number] DOB: 1966/07/05 DOA: 04/22/2014   PCP: Maurice Small, D, MD   Chief Complaint: Bilateral lower extremity edema and pain  HPI:  48 year old female with history of hypertension, uncontrolled diabetes, CKD stage III, chronic diastolic CHF, and medical noncompliance presented with two-day history of worsening lower extremity edema and pain. She also complains that her chronic left foot ulceration has not been healing but denied any worsening erythema or drainage. The patient was discharged from the hospital on 04/14/2014 with instructions to take doxycycline, but the patient never took it. The patient has subjective fevers and chills, but denied any chest discomfort, nausea, vomiting, diarrhea, vomiting, dysuria, hematuria. She complains of dyspnea on exertion for the last several weeks but denied any orthopnea, PND. The patient states that she had a mechanical fall resulting in right distal humerus fracture on 02/15/2014. She was blood in the emergency department, but never followed up with orthopedics. She denies any worsening pain in the right upper extremity. The patient was also found to have hemoglobin of 6.2. She says that she has intermittent hematochezia. She had a colonoscopy on 06/22/2013 which revealed small internal and external hemorrhoids. She denies any NSAID use.  In the emergency department, the patient was noted to have a serum glucose of 447, serum creatinine 2.20 and sodium 125. WBC was 5.9, sedimentation rate88. Hemoglobin was 6.2. Assessment/Plan: Hyperosmolar Nonketotic State -Presenting serum glucose of 447 with normal anion gap -Patient's metabolic acidosis is likely due to her CKD3 as pt has had this on prior labs -Judicious IV fluids -I believe the patient is stable for [place] insulin--certainly if gap widens and pt declines clinically-->IV insulin -Patient endorses compliance with insulin although it is well documented in  the medical record of patient's noncompliance with medications and followup Dyspnea on exertion -The patient is clinically stable with oxygen saturation 99% on RA -Multifactorial including deconditioning, OHS, Anemia -CXR -EKG -Echo -low clinical suspicion of PE Lower extremity edema -Venous duplex r/o DVT Chronic diastolic dysfunction -0000000 echocardiogram shows EF 123456, grade 1 diastolic dysfunction, no WMA -although pt has LE edema, she does not appear clinically decompensated -I expect proBNP to be elevated in the setting of CKD Chronic blood loss anemia  -The patient is a  Jehovah's witness -Colonoscopy on 06/22/2013 showed small internal and external hemorrhoids  -I have consulted GI--Dr. Collene Mares -restart iron--pt noncompliant -check iron studies--may need IV iron dosing Trichomonas vaginitis  -Flagyl 500 mg twice a day  -Check urine NAA for GC and chlamydia -I believe the patient's pyuria is likely due to her Trichomonas vaginitis, but will follow urine culture  -HIV antibody -last sexually active 2 weeks ago Right distal humerus fracture/diabetic foot ulcer -The diabetic foot ulcer on the left foot is not clinically infected on exam -check CRP -I consulted Dr. Sharol Given for both problems -pt had mechanical fall on 02/15/14 and R-arm was splinted in ED but she never followed up with ortho -X-ray right humerus -X-ray of the left foot negative for any subcutaneous air or erosions Diabetes mellitus type 2 with renal complications -Continue home dose Lantus -NovoLog sliding scale -04/12/2014 hemoglobin A1c 8.8 Hyponatremia  -This is partly due to  pseudohyponatremia from the patient's hyperglycemia as well as volume depletion from solute diuresis from her hyperglycemia  -judicious fluids Stage 3-4 CKD -Baseline creatinine 2.0-2.4     Past Medical History  Diagnosis Date  . Neuropathy   . Glaucoma   . Bipolar  1 disorder   . CHF (congestive heart failure)   .  Refusal of blood transfusions as patient is Jehovah's Witness   . Hypertension   . Shortness of breath   . Diabetes mellitus     INSULIN DEPENDENT  . TIA (transient ischemic attack)   . Neuromuscular disorder     DIABETIC NEUROPATHY  . Chronic kidney disease   . Hepatitis C virus   . Seizure     diagnosed 2014  . MRSA infection   . Bipolar disorder, unspecified 02/06/2014  . Schizoaffective disorder, unspecified condition 02/08/2014   Past Surgical History  Procedure Laterality Date  . Abdominal hysterectomy    . Amputation Left 05/21/2013    Procedure: Left Midfoot AMPUTATION;  Surgeon: Newt Minion, MD;  Location: [facility];  Service: Orthopedics;  Laterality: Left;  Left Midfoot Amputation  . Tubal ligation    . Colonoscopy with propofol N/A 06/22/2013    Procedure: COLONOSCOPY WITH PROPOFOL;  Surgeon: Jeryl Columbia, MD;  Location: [facility];  Service: Endoscopy;  Laterality: N/A;   Social History:  reports that she has been smoking Cigarettes.  She has a 7.5 pack-year smoking history. She has never used smokeless tobacco. She reports that she drinks alcohol. She reports that she does not use illicit drugs.   Family History  Problem Relation Age of Onset  . Hypertension Mother   . Hypertension Father      Allergies  Allergen Reactions  . Gabapentin Itching and Swelling  . Saphris [Asenapine] Swelling    Swelling of the face  . Tramadol Swelling    Causes legs to swell  . Penicillins Hives    Has tolerated Cefepime and Ceftriaxone.      Prior to Admission medications   Medication Sig Start Date End Date Taking? Authorizing Provider  carvedilol (COREG) 25 MG tablet Take 1 tablet (25 mg total) by mouth 2 (two) times daily. 04/14/14  Yes Nishant Dhungel, MD  cloNIDine (CATAPRES) 0.1 MG tablet Take 0.1 mg by mouth daily.   Yes Historical Provider, MD  DULoxetine (CYMBALTA) 60 MG capsule Take 60 mg by mouth daily.   Yes Historical Provider, MD  ferrous sulfate 325 (65 FE)  MG tablet Take 1 tablet (325 mg total) by mouth 3 (three) times daily with meals. 04/14/14  Yes Nishant Dhungel, MD  folic acid (FOLVITE) A999333 MCG tablet Take 400 mcg by mouth every morning.   Yes Historical Provider, MD  furosemide (LASIX) 20 MG tablet Take 20 mg by mouth every morning.   Yes Historical Provider, MD  insulin aspart (NOVOLOG) 100 UNIT/ML injection Inject 2-10 Units into the skin 3 (three) times daily before meals. SSI: 0-200 = 2 units, 201-250 = 4 units, 251-300 = 6 units, 301-350 = 8 units, 351-400 = 10 units   Yes Historical Provider, MD  insulin glargine (LANTUS) 100 UNIT/ML injection Inject 45 Units into the skin at bedtime.    Yes Historical Provider, MD  levETIRAcetam (KEPPRA) 500 MG tablet Take 500 mg by mouth 2 (two) times daily.   Yes Historical Provider, MD  Multiple Vitamins-Minerals (SENIOR MULTIVITAMIN PLUS PO) Take 1 tablet by mouth every morning.   Yes Historical Provider, MD  polyethylene glycol (MIRALAX / GLYCOLAX) packet Take 17 g by mouth 2 (two) times daily.   Yes Historical Provider, MD  pregabalin (LYRICA) 75 MG capsule Take 75 mg by mouth 2 (two) times daily.   Yes Historical Provider, MD  silver sulfADIAZINE (SILVADENE) 1 % cream Apply  topically daily. 04/14/14  Yes Nishant Dhungel, MD  simethicone (MYLICON) 80 MG chewable tablet Chew 80 mg by mouth every 8 (eight) hours as needed for flatulence.   Yes Historical Provider, MD    Review of Systems:  Constitutional:  No weight loss, night sweats Head&Eyes: No headache.  No vision loss.  No eye pain or scotoma ENT:  No Difficulty swallowing,Tooth/dental problems,Sore throat,   Cardio-vascular:  No chest pain, Orthopnea, PND dizziness, palpitations  GI:  No  abdominal pain, nausea, vomiting, diarrhea, loss of appetite, hematochezia, melena, heartburn, indigestion, Resp:   No cough. No coughing up of blood .No wheezing.No chest wall deformity  Skin:  no rash or lesions.  GU:  no dysuria, change in color  of urine, no urgency or frequency. No flank pain.  Musculoskeletal:  No joint pain or swelling. No decreased range of motion. No back pain.  Psych:  No change in mood or affect. No depression or anxiety. Neurologic: No headache, no dysesthesia, no focal weakness, no vision loss. No syncope  Physical Exam: Filed Vitals:   04/22/14 0539 04/22/14 0700 04/22/14 0830  BP: 207/103 107/84   Pulse: 83 107 95  Temp: 98.7 F (37.1 C)    TempSrc: Oral    Resp: 20 24 22   SpO2: 100% 100% 98%   General:  A&O x 3, NAD, nontoxic, pleasant/cooperative Head/Eye: No conjunctival hemorrhage, no icterus, /AT, No nystagmus ENT:  No icterus,  No thrush, poor dentition, no pharyngeal exudate Neck:  No masses, no lymphadenpathy, no bruits CV:  RRR, no rub, no gallop, no S3 Lung:  CTAB, good air movement, no wheeze, no rhonchi Abdomen: soft/NT, +BS, nondistended, no peritoneal signs Ext: No cyanosis, No rashes, No petechiae, No lymphangitis, 3+ L>R LE edema; Left foot with plantar ulceration approximately 6 x 6 cm with good granulation tissue. There is no streaking lymphangitis, crepitance, necrosis, drainage   Labs on Admission:  Basic Metabolic Panel:  Recent Labs Lab 04/22/14 0731  NA 125*  K 3.9  CL 95*  CO2 19  GLUCOSE 447*  BUN 46*  CREATININE 2.20*  CALCIUM 8.4   Liver Function Tests: No results found for this basename: AST, ALT, ALKPHOS, BILITOT, PROT, ALBUMIN,  in the last 168 hours No results found for this basename: LIPASE, AMYLASE,  in the last 168 hours No results found for this basename: AMMONIA,  in the last 168 hours CBC:  Recent Labs Lab 04/22/14 0731  WBC 5.9  NEUTROABS 4.1  HGB 6.2*  HCT 18.8*  MCV 86.2  PLT 265   Cardiac Enzymes: No results found for this basename: CKTOTAL, CKMB, CKMBINDEX, TROPONINI,  in the last 168 hours BNP: No components found with this basename: POCBNP,  CBG:  Recent Labs Lab 04/22/14 0535 04/22/14 0750  GLUCAP 417* 430*     Radiological Exams on Admission: Dg Foot Complete Left  04/22/2014   CLINICAL DATA:  Distal left foot ulcer  EXAM: LEFT FOOT - COMPLETE 3+ VIEW  COMPARISON:  04/11/2014  FINDINGS: There again noted changes consistent with transmetatarsal amputation. The overall appearance of the bony structures is stable soft tissue defect is noted overlying the stump similar to that seen on the prior exam. No definitive bony erosion is seen to suggest osteomyelitis.  IMPRESSION: Postoperative changes as described.  Persistent soft tissue defect is noted distally without definitive osteomyelitis.   Electronically Signed   By: Inez Catalina M.D.   On: 04/22/2014 07:36    EKG: Independently reviewed. pending  Time spent:70 minutes Code Status:   FULL Family Communication:   No Family at bedside   Sotero Brinkmeyer, DO  [facility] Pager [phone]  If 7PM-7AM, please contact night-coverage [email or web address] Password [facility] 04/22/2014, 10:37 AM

## 2014-04-22 NOTE — ED Notes (Signed)
Pt disconnected IV tubing herself. Assessed and new tubing applied.

## 2014-04-22 NOTE — ED Notes (Signed)
CBG 412, BP 180/120

## 2014-04-22 NOTE — ED Notes (Signed)
Bed: [id number] Expected date:  Expected time:  Means of arrival:  Comments: EMS 48 yo female left foot pain-hx Diabetes-partial amputation with wound bottom of foot/lower extremity swelling/has a fx to upper extremity

## 2014-04-22 NOTE — ED Notes (Signed)
Informed RN of CBG.

## 2014-04-23 DIAGNOSIS — E1100 Type 2 diabetes mellitus with hyperosmolarity without nonketotic hyperglycemic-hyperosmolar coma (NKHHC): Secondary | ICD-10-CM | POA: Diagnosis not present

## 2014-04-23 DIAGNOSIS — K625 Hemorrhage of anus and rectum: Secondary | ICD-10-CM

## 2014-04-23 DIAGNOSIS — L089 Local infection of the skin and subcutaneous tissue, unspecified: Secondary | ICD-10-CM

## 2014-04-23 DIAGNOSIS — E1169 Type 2 diabetes mellitus with other specified complication: Secondary | ICD-10-CM

## 2014-04-23 DIAGNOSIS — D638 Anemia in other chronic diseases classified elsewhere: Secondary | ICD-10-CM

## 2014-04-23 LAB — CBC
HCT: 16.8 % — ABNORMAL LOW (ref 36.0–46.0)
HEMOGLOBIN: 5.4 g/dL — AB (ref 12.0–15.0)
MCH: 27.6 pg (ref 26.0–34.0)
MCHC: 32.1 g/dL (ref 30.0–36.0)
MCV: 85.7 fL (ref 78.0–100.0)
PLATELETS: 196 10*3/uL (ref 150–400)
RBC: 1.96 MIL/uL — ABNORMAL LOW (ref 3.87–5.11)
RDW: 16.3 % — AB (ref 11.5–15.5)
WBC: 3 10*3/uL — ABNORMAL LOW (ref 4.0–10.5)

## 2014-04-23 LAB — BASIC METABOLIC PANEL
ANION GAP: 12 (ref 5–15)
BUN: 47 mg/dL — AB (ref 6–23)
CALCIUM: 8.2 mg/dL — AB (ref 8.4–10.5)
CO2: 21 mEq/L (ref 19–32)
CREATININE: 2.43 mg/dL — AB (ref 0.50–1.10)
Chloride: 102 mEq/L (ref 96–112)
GFR calc Af Amer: 26 mL/min — ABNORMAL LOW (ref >90)
GFR, EST NON AFRICAN AMERICAN: 23 mL/min — AB (ref >90)
Glucose, Bld: 121 mg/dL — ABNORMAL HIGH (ref 70–99)
Potassium: 4.8 mEq/L (ref 3.7–5.3)
Sodium: 135 mEq/L — ABNORMAL LOW (ref 137–147)

## 2014-04-23 LAB — GLUCOSE, CAPILLARY
GLUCOSE-CAPILLARY: 303 mg/dL — AB (ref 70–99)
Glucose-Capillary: 145 mg/dL — ABNORMAL HIGH (ref 70–99)
Glucose-Capillary: 169 mg/dL — ABNORMAL HIGH (ref 70–99)
Glucose-Capillary: 215 mg/dL — ABNORMAL HIGH (ref 70–99)

## 2014-04-23 LAB — IRON AND TIBC
Iron: 39 ug/dL — ABNORMAL LOW (ref 42–135)
Saturation Ratios: 15 % — ABNORMAL LOW (ref 20–55)
TIBC: 259 ug/dL (ref 250–470)
UIBC: 220 ug/dL (ref 125–400)

## 2014-04-23 LAB — HIV ANTIBODY (ROUTINE TESTING W REFLEX): HIV 1&2 Ab, 4th Generation: NONREACTIVE

## 2014-04-23 LAB — FERRITIN: FERRITIN: 716 ng/mL — AB (ref 10–291)

## 2014-04-23 MED ORDER — COLLAGENASE 250 UNIT/GM EX OINT
TOPICAL_OINTMENT | Freq: Every day | CUTANEOUS | Status: DC
  Administered 2014-04-23 – 2014-04-25 (×3): via TOPICAL
  Filled 2014-04-23: qty 30

## 2014-04-23 MED ORDER — OXYCODONE-ACETAMINOPHEN 5-325 MG PO TABS
1.0000 | ORAL_TABLET | ORAL | Status: DC | PRN
  Administered 2014-04-23: 1 via ORAL
  Administered 2014-04-23 – 2014-04-25 (×2): 2 via ORAL
  Filled 2014-04-23 (×3): qty 2
  Filled 2014-04-23: qty 1

## 2014-04-23 NOTE — Progress Notes (Addendum)
   No stools, no rectal bleeding. Vomited 1x   Lab Results  Component Value Date   WBC 3.0* 04/23/2014   HGB 5.4* 04/23/2014   HCT 16.8* 04/23/2014   MCV 85.7 04/23/2014   PLT 196 04/23/2014    BP 124/69  Pulse 89  Temp(Src) 98 F (36.7 C) (Oral)  Resp 20  Ht 5\' 4"  (1.626 m)  Wt 196 lb 6.9 oz (89.1 kg)  BMI 33.70 kg/m2  SpO2 97% Abd soft and NT  A/P  1) Recta lbleeding - thought hemorrhoidsal - stopped 2) Anemia of chronic dz -Hgb drifted with hydration 3) JEHOVAH's WITNESS  Nothing todo from GI [perspective now - will follow-up1-2days to see if that changes  Would minimize labs and do in pediatric tubes  Gatha Mayer, MD, [facility] [phone] (pager) 04/23/2014 10:38 AM

## 2014-04-23 NOTE — Progress Notes (Addendum)
Patient ID: [name], female   DOB: [birth year], 48 y.o.   MRN: [id number]   [facility] PROGRESS NOTE  [name] [id number] DOB: Nov 21, 1965 DOA: 04/22/2014 PCP: Maurice Small, D, MD  Brief narrative: 48 year old female with hypertension, uncontrolled diabetes, CKD stage III, chronic diastolic CHF, and medical noncompliance presented with two-day history of worsening lower extremity edema and pain, associated with subjective fevers, chills. She also reported that her chronic left foot ulceration has not been healing but denied any worsening erythema or drainage. The patient was discharged from the hospital on 04/14/2014 with instructions to take doxycycline, but the patient never took it. Pt also reported one episode of fall 02/15/2014 resulting in right distal humerus fracture on 02/15/2014, never follow up with ortho. She had a colonoscopy on 06/22/2013 which revealed small internal and external hemorrhoids.  In the emergency department, the patient was noted to have a serum glucose of 447, serum creatinine 2.20 and sodium 125. WBC was 5.9, sedimentation rate88. Hemoglobin was 6.2.   Assessment and Plan:    Hyperosmolar Nonketotic State  - Presenting serum glucose of 447 with normal anion gap, CBG in 120's this AM  - CO2 is stable and WNL this AM  - stop IVF as pt tolerating diet well  Dyspnea on exertion  - clinically stable this AM, maintaining oxygen saturation at target ranfe  - Multifactorial including deconditioning, OHS, Anemia  - low clinical suspicion of PE  Acute on chronic kidney disease, stage III - Cr trending up  Lower extremity edema  - left Venous duplex negative for DVT Chronic diastolic dysfunction  - 0000000 echocardiogram shows EF 123456, grade 1 diastolic dysfunction, no WMA  - proBNP to be elevated in the setting of CKD  - not volume overloaded on exam - 2 D ECHO with grade I diastolic dysfunction  - weight this AM is 196 lbs  Acute on chronic  blood loss anemia  - The patient is a Jehovah's witness  - Colonoscopy on 06/22/2013 showed small internal and external hemorrhoids  - minimize blood work - appreciate GI input  Trichomonas vaginitis  - Flagyl 500 mg twice a day  - pyuria is likely due to her Trichomonas vaginitis, but will follow urine culture  -HIV antibody pending  Right distal humerus fracture/diabetic foot ulcer  - The diabetic foot ulcer on the left foot is not clinically infected on exam  - ortho consulted, appreciate recommendations  Diabetes mellitus type 2 with renal complications and neuropaties - Continue home dose Lantus  - NovoLog sliding scale  - 04/12/2014 hemoglobin A1c 8.8  Hyponatremia  - partly due to pseudohyponatremia from hyperglycemia and  volume depletion from solute diuresis from hyperglycemia  - Na is stable this AM Left plantar foot ulcer near transmetatarsal amputation site.  - Generalized edema lower extremities, patient reports this is some better today.  - Heavy callous formation near wound edges/periwound - continue wound care   DVT prophylaxis  Lovenox SQ while pt is in hospital  Code Status: Full Family Communication: Pt at bedside Disposition Plan: SW consult for d/c plan to SNF  IV Access:   Peripheral IV Procedures and diagnostic studies:   Dg Chest 2 View  04/22/2014   No acute cardiopulmonary disease.  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   Dg Foot Complete Left  04/22/2014   Postoperative changes as described.  Persistent soft tissue defect is noted distally without definitive  osteomyelitis.   Medical Consultants:   Ortho GI Other Consultants:   Physical therapy  Anti-Infectives:   Flagyl 9/18 --> Maxipime 9/18 -->   [name] Ramsay, MD  [facility] Pager [phone]  If 7PM-7AM, please contact night-coverage [email or web address] Password [email or web address] 04/23/2014, 4:05 PM   LOS: 1 day    HPI/Subjective: No events overnight.   Objective: Filed Vitals:   04/22/14 2128 04/23/14 0539 04/23/14 0607 04/23/14 1401  BP: 146/79 124/69  125/82  Pulse: 87 89  91  Temp: 98 F (36.7 C) 98 F (36.7 C)  98.1 F (36.7 C)  TempSrc: Oral Oral  Oral  Resp: 20 20  18   Height:      Weight:   89.1 kg (196 lb 6.9 oz)   SpO2: 100% 97%  100%    Intake/Output Summary (Last 24 hours) at 04/23/14 1605 Last data filed at 04/22/14 1701  Gross per 24 hour  Intake    360 ml  Output      0 ml  Net    360 ml    Exam:   General:  Pt is alert, follows commands appropriately, not in acute distress  Cardiovascular: Regular rate and rhythm, S1/S2, no murmurs, no rubs, no gallops  Respiratory: Clear to auscultation bilaterally, no wheezing, no crackles, no rhonchi  Abdomen: Soft, non tender, non distended, bowel sounds present, no guarding   Data Reviewed: Basic Metabolic Panel:  Recent Labs Lab 04/22/14 0731 04/22/14 1705 04/23/14 0520  NA 125*  --  135*  K 3.9  --  4.8  CL 95*  --  102  CO2 19  --  21  GLUCOSE 447* 489* 121*  BUN 46*  --  47*  CREATININE 2.20*  --  2.43*  CALCIUM 8.4  --  8.2*   Liver Function Tests:  Recent Labs Lab 04/22/14 1409  AST 24  ALT 24  ALKPHOS 101  BILITOT 0.2*  PROT 7.2  ALBUMIN 2.1*   CBC:  Recent Labs Lab 04/22/14 0731 04/23/14 0520  WBC 5.9 3.0*  NEUTROABS 4.1  --   HGB 6.2* 5.4*  HCT 18.8* 16.8*  MCV 86.2 85.7  PLT 265 196   CBG:  Recent Labs Lab 04/22/14 0750 04/22/14 1644 04/22/14 2150 04/23/14 0720 04/23/14 1134  GLUCAP 430* 450* 119* 145* 303*    Recent Results (from the past 240 hour(s))  URINE CULTURE     Status: None   Collection Time    04/22/14  6:58 AM      Result Value Ref Range Status   Specimen Description URINE, CLEAN CATCH   Final   Special Requests Normal   Final   Culture  Setup Time     Final   Value: 04/22/2014 12:19     Performed at [place] PENDING    Incomplete   Culture     Final   Value: Culture reincubated for better growth     Performed at [facility]   Report Status PENDING   Incomplete     Scheduled Meds: . carvedilol  25 mg Oral BID  . ceFEPime (MAXIPIME) IV  2 g Intravenous Q24H  . collagenase   Topical Daily  . DULoxetine  60 mg Oral Daily  . ferrous sulfate  325 mg Oral TID WC  . folic acid  [id number] mcg Oral q morning - 10a  . furosemide  20 mg Oral q morning - 10a  . insulin aspart  0-20 Units Subcutaneous  TID WC  . insulin aspart  0-5 Units Subcutaneous QHS  . insulin glargine  45 Units Subcutaneous QHS  . levETIRAcetam  500 mg Oral BID  . metroNIDAZOLE  500 mg Oral Q12H  . polyethylene glycol  17 g Oral BID  . pregabalin  75 mg Oral BID   Continuous Infusions:

## 2014-04-23 NOTE — Consult Note (Signed)
Reason for Consult: Ulceration left foot transmetatarsal amputation and chronic fracture right distal humerus supracondylar Referring Physician:Dr Tandrea Kommer is an 48 y.o. female.  HPI:  Patient is a 48 year old woman who is status post a transmetatarsal amputation by myself. Patient states that she has not been back to my office for followup. Patient states that while she was in rehabilitation she fell sustained a supracondylar humerus fracture on the right but has not been followed up medically for this.  Past Medical History  Diagnosis Date  . Neuropathy   . Glaucoma   . Bipolar 1 disorder   . CHF (congestive heart failure)   . Refusal of blood transfusions as patient is Jehovah's Witness   . Hypertension   . Shortness of breath   . Diabetes mellitus     INSULIN DEPENDENT  . TIA (transient ischemic attack)   . Neuromuscular disorder     DIABETIC NEUROPATHY  . Chronic kidney disease   . Hepatitis C virus   . Seizure     diagnosed 2014  . MRSA infection   . Bipolar disorder, unspecified 02/06/2014  . Schizoaffective disorder, unspecified condition 02/08/2014    Past Surgical History  Procedure Laterality Date  . Abdominal hysterectomy    . Amputation Left 05/21/2013    Procedure: Left Midfoot AMPUTATION;  Surgeon: Newt Minion, MD;  Location: [facility];  Service: Orthopedics;  Laterality: Left;  Left Midfoot Amputation  . Tubal ligation    . Colonoscopy with propofol N/A 06/22/2013    Procedure: COLONOSCOPY WITH PROPOFOL;  Surgeon: Jeryl Columbia, MD;  Location: [facility];  Service: Endoscopy;  Laterality: N/A;    Family History  Problem Relation Age of Onset  . Hypertension Mother   . Hypertension Father     Social History:  reports that she has been smoking Cigarettes.  She has a 7.5 pack-year smoking history. She has never used smokeless tobacco. She reports that she drinks alcohol. She reports that she does not use illicit drugs.  Allergies:  Allergies   Allergen Reactions  . Gabapentin Itching and Swelling  . Saphris [Asenapine] Swelling    Swelling of the face  . Tramadol Swelling    Causes legs to swell  . Penicillins Hives    Has tolerated Cefepime and Ceftriaxone.    Medications: I have reviewed the patient's current medications.  Results for orders placed during the hospital encounter of 04/22/14 (from the past 48 hour(s))  CBG MONITORING, ED     Status: Abnormal   Collection Time    04/22/14  5:35 AM      Result Value Ref Range   Glucose-Capillary 417 (*) 70 - 99 mg/dL   Comment 1 Notify RN     Comment 2 Orig Pt Id entered as [id number]    URINALYSIS, ROUTINE W REFLEX MICROSCOPIC     Status: Abnormal   Collection Time    04/22/14  6:58 AM      Result Value Ref Range   Color, Urine YELLOW  YELLOW   APPearance CLOUDY (*) CLEAR   Specific Gravity, Urine 1.015  1.005 - 1.030   pH 6.0  5.0 - 8.0   Glucose, UA >1000 (*) NEGATIVE mg/dL   Hgb urine dipstick SMALL (*) NEGATIVE   Bilirubin Urine NEGATIVE  NEGATIVE   Ketones, ur NEGATIVE  NEGATIVE mg/dL   Protein, ur >300 (*) NEGATIVE mg/dL   Urobilinogen, UA 0.2  0.0 - 1.0 mg/dL   Nitrite NEGATIVE  NEGATIVE  Leukocytes, UA MODERATE (*) NEGATIVE  URINE MICROSCOPIC-ADD ON     Status: Abnormal   Collection Time    04/22/14  6:58 AM      Result Value Ref Range   Squamous Epithelial / LPF RARE  RARE   WBC, UA 11-20  <3 WBC/hpf   Bacteria, UA MANY (*) RARE   Urine-Other TRICHOMONAS PRESENT    CBC WITH DIFFERENTIAL     Status: Abnormal   Collection Time    04/22/14  7:31 AM      Result Value Ref Range   WBC 5.9  4.0 - 10.5 K/uL   RBC 2.18 (*) 3.87 - 5.11 MIL/uL   Hemoglobin 6.2 (*) 12.0 - 15.0 g/dL   Comment: REPEATED TO VERIFY     CRITICAL RESULT CALLED TO, READ BACK BY AND VERIFIED WITH:     N. DAVIS RN AT 0805 ON 09.18.15     BY SHUEA   HCT 18.8 (*) 36.0 - 46.0 %   MCV 86.2  78.0 - 100.0 fL   MCH 28.4  26.0 - 34.0 pg   MCHC 33.0  30.0 - 36.0 g/dL   RDW 16.0 (*)  11.5 - 15.5 %   Platelets 265  150 - 400 K/uL   Neutrophils Relative % 69  43 - 77 %   Neutro Abs 4.1  1.7 - 7.7 K/uL   Lymphocytes Relative 24  12 - 46 %   Lymphs Abs 1.4  0.7 - 4.0 K/uL   Monocytes Relative 6  3 - 12 %   Monocytes Absolute 0.3  0.1 - 1.0 K/uL   Eosinophils Relative 1  0 - 5 %   Eosinophils Absolute 0.1  0.0 - 0.7 K/uL   Basophils Relative 0  0 - 1 %   Basophils Absolute 0.0  0.0 - 0.1 K/uL  BASIC METABOLIC PANEL     Status: Abnormal   Collection Time    04/22/14  7:31 AM      Result Value Ref Range   Sodium 125 (*) 137 - 147 mEq/L   Potassium 3.9  3.7 - 5.3 mEq/L   Chloride 95 (*) 96 - 112 mEq/L   CO2 19  19 - 32 mEq/L   Glucose, Bld 447 (*) 70 - 99 mg/dL   BUN 46 (*) 6 - 23 mg/dL   Creatinine, Ser 2.20 (*) 0.50 - 1.10 mg/dL   Calcium 8.4  8.4 - 10.5 mg/dL   GFR calc non Af Amer 25 (*) >90 mL/min   GFR calc Af Amer 29 (*) >90 mL/min   Comment: (NOTE)     The eGFR has been calculated using the CKD EPI equation.     This calculation has not been validated in all clinical situations.     eGFR's persistently <90 mL/min signify possible Chronic Kidney     Disease.   Anion gap 11  5 - 15  SEDIMENTATION RATE     Status: Abnormal   Collection Time    04/22/14  7:31 AM      Result Value Ref Range   Sed Rate 88 (*) 0 - 22 mm/hr  C-REACTIVE PROTEIN     Status: Abnormal   Collection Time    04/22/14  7:31 AM      Result Value Ref Range   CRP <0.5 (*) <0.60 mg/dL   Comment: Performed at [facility], ED     Status: Abnormal   Collection Time    04/22/14  7:50 AM      Result Value Ref Range   Glucose-Capillary 430 (*) 70 - 99 mg/dL  POC OCCULT BLOOD, ED     Status: Abnormal   Collection Time    04/22/14  9:43 AM      Result Value Ref Range   Fecal Occult Bld POSITIVE (*) NEGATIVE  NO BLOOD PRODUCTS     Status: None   Collection Time    04/22/14  1:15 PM      Result Value Ref Range   Transfuse no blood products       Value:  TRANSFUSE NO BLOOD PRODUCTS, VERIFIED BY CHASITY HEARN, RN  FERRITIN     Status: Abnormal   Collection Time    04/22/14  2:09 PM      Result Value Ref Range   Ferritin 716 (*) 10 - 291 ng/mL   Comment: Performed at [place]     Status: Abnormal   Collection Time    04/22/14  2:09 PM      Result Value Ref Range   Retic Ct Pct 5.0 (*) 0.4 - 3.1 %   RBC. 2.39 (*) 3.87 - 5.11 MIL/uL   Retic Count, Manual 119.5  19.0 - 186.0 K/uL  HEPATIC FUNCTION PANEL     Status: Abnormal   Collection Time    04/22/14  2:09 PM      Result Value Ref Range   Total Protein 7.2  6.0 - 8.3 g/dL   Albumin 2.1 (*) 3.5 - 5.2 g/dL   AST 24  0 - 37 U/L   ALT 24  0 - 35 U/L   Alkaline Phosphatase 101  39 - 117 U/L   Total Bilirubin 0.2 (*) 0.3 - 1.2 mg/dL   Bilirubin, Direct <0.2  0.0 - 0.3 mg/dL   Indirect Bilirubin NOT CALCULATED  0.3 - 0.9 mg/dL  HIV ANTIBODY (ROUTINE TESTING)     Status: None   Collection Time    04/22/14  2:09 PM      Result Value Ref Range   HIV 1&2 Ab, 4th Generation NONREACTIVE  NONREACTIVE   Comment: (NOTE)     A NONREACTIVE HIV Ag/Ab result does not exclude HIV infection since     the time frame for seroconversion is variable. If acute HIV infection     is suspected, a HIV-1 RNA Qualitative TMA test is recommended.     HIV-1/2 Antibody Diff         Not indicated.     HIV-1 RNA, Qual TMA           Not indicated.     PLEASE NOTE: This information has been disclosed to you from records     whose confidentiality may be protected by state law. If your state     requires such protection, then the state law prohibits you from making     any further disclosure of the information without the specific written     consent of the person to whom it pertains, or as otherwise permitted     by law. A general authorization for the release of medical or other     information is NOT sufficient for this purpose.     The performance of this assay has not been clinically  validated in     patients less than 26 years old.     Performed at [place], [facility]     Status: Abnormal   Collection Time  04/22/14  4:44 PM      Result Value Ref Range   Glucose-Capillary 450 (*) 70 - 99 mg/dL  GLUCOSE, RANDOM     Status: Abnormal   Collection Time    04/22/14  5:05 PM      Result Value Ref Range   Glucose, Bld 489 (*) 70 - 99 mg/dL  GLUCOSE, [facility]     Status: Abnormal   Collection Time    04/22/14  9:50 PM      Result Value Ref Range   Glucose-Capillary 119 (*) 70 - 99 mg/dL   Comment 1 Notify RN    BASIC METABOLIC PANEL     Status: Abnormal   Collection Time    04/23/14  5:20 AM      Result Value Ref Range   Sodium 135 (*) 137 - 147 mEq/L   Comment: RESULT REPEATED AND VERIFIED     DELTA CHECK NOTED   Potassium 4.8  3.7 - 5.3 mEq/L   Comment: RESULT REPEATED AND VERIFIED     DELTA CHECK NOTED   Chloride 102  96 - 112 mEq/L   CO2 21  19 - 32 mEq/L   Glucose, Bld 121 (*) 70 - 99 mg/dL   BUN 47 (*) 6 - 23 mg/dL   Creatinine, Ser 2.43 (*) 0.50 - 1.10 mg/dL   Calcium 8.2 (*) 8.4 - 10.5 mg/dL   GFR calc non Af Amer 23 (*) >90 mL/min   GFR calc Af Amer 26 (*) >90 mL/min   Comment: (NOTE)     The eGFR has been calculated using the CKD EPI equation.     This calculation has not been validated in all clinical situations.     eGFR's persistently <90 mL/min signify possible Chronic Kidney     Disease.   Anion gap 12  5 - 15  CBC     Status: Abnormal   Collection Time    04/23/14  5:20 AM      Result Value Ref Range   WBC 3.0 (*) 4.0 - 10.5 K/uL   RBC 1.96 (*) 3.87 - 5.11 MIL/uL   Hemoglobin 5.4 (*) 12.0 - 15.0 g/dL   Comment: REPEATED TO VERIFY     CRITICAL VALUE NOTED.  VALUE IS CONSISTENT WITH PREVIOUSLY REPORTED AND CALLED VALUE.   HCT 16.8 (*) 36.0 - 46.0 %   MCV 85.7  78.0 - 100.0 fL   MCH 27.6  26.0 - 34.0 pg   MCHC 32.1  30.0 - 36.0 g/dL   RDW 16.3 (*) 11.5 - 15.5 %   Platelets 196  150 - 400 K/uL   Comment: DELTA  CHECK NOTED     REPEATED TO VERIFY     SPECIMEN CHECKED FOR CLOTS  GLUCOSE, [facility]     Status: Abnormal   Collection Time    04/23/14  7:20 AM      Result Value Ref Range   Glucose-Capillary 145 (*) 70 - 99 mg/dL    Dg Chest 2 View  04/22/2014   CLINICAL DATA:  Dyspnea on exertion.  EXAM: CHEST  2 VIEW  COMPARISON:  04/11/2014.  FINDINGS: Cardiac silhouette is mildly enlarged. Aorta is mildly uncoiled. No mediastinal or hilar masses. No evidence of adenopathy  Clear lungs.  No pleural effusion or pneumothorax.  Bony thorax is intact.  IMPRESSION: No acute cardiopulmonary disease. No change from the recent prior study.   Electronically Signed   By: Lajean Manes M.D.   On: 04/22/2014 18:13  Dg Humerus Right  04/22/2014   CLINICAL DATA:  Distal humeral fracture.  EXAM: RIGHT HUMERUS - 2+ VIEW  COMPARISON:  April 11, 2014 ; March 07, 2014.  FINDINGS: Continued presence of moderately displaced supracondylar fracture of distal right humerus is noted. Some callus formation is noted, but persistent nonunion remains.  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   Electronically Signed   By: Sabino Dick M.D.   On: 04/22/2014 15:00   Dg Foot Complete Left  04/22/2014   CLINICAL DATA:  Distal left foot ulcer  EXAM: LEFT FOOT - COMPLETE 3+ VIEW  COMPARISON:  04/11/2014  FINDINGS: There again noted changes consistent with transmetatarsal amputation. The overall appearance of the bony structures is stable soft tissue defect is noted overlying the stump similar to that seen on the prior exam. No definitive bony erosion is seen to suggest osteomyelitis.  IMPRESSION: Postoperative changes as described.  Persistent soft tissue defect is noted distally without definitive osteomyelitis.   Electronically Signed   By: Inez Catalina M.D.   On: 04/22/2014 07:36    Review of Systems  All other systems reviewed and are negative.  Blood pressure 124/69, pulse  89, temperature 98 F (36.7 C), temperature source Oral, resp. rate 20, height 5' 4" (1.626 m), weight 89.1 kg (196 lb 6.9 oz), SpO2 97.00%. Physical Exam On examination patient's right upper extremity is in a posterior splint. Radiograph shows a nondisplaced nonunion of a supracondylar humerus fracture on the right. Examination left foot she has a pressure ulcer on the plantar aspect of her foot this is superficial the ulcers approximately 5 cm in diameter. 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  Plan: We'll continue the posterior splint for the right elbow. Recommend Santyl dressing changes daily for the left foot strict nonweightbearing on the left. Patient will need discharge to skilled nursing for wound care as well as care of her fracture. I will followup as an outpatient.  DUDA,MARCUS V 04/23/2014, 10:08 AM

## 2014-04-24 LAB — GLUCOSE, CAPILLARY
GLUCOSE-CAPILLARY: 116 mg/dL — AB (ref 70–99)
GLUCOSE-CAPILLARY: 313 mg/dL — AB (ref 70–99)
GLUCOSE-CAPILLARY: 299 mg/dL — AB (ref 70–99)
Glucose-Capillary: 134 mg/dL — ABNORMAL HIGH (ref 70–99)

## 2014-04-24 LAB — URINE CULTURE: SPECIAL REQUESTS: NORMAL

## 2014-04-24 MED ORDER — FLEET ENEMA 7-19 GM/118ML RE ENEM
1.0000 | ENEMA | Freq: Once | RECTAL | Status: AC
  Administered 2014-04-24: 1 via RECTAL
  Filled 2014-04-24: qty 1

## 2014-04-24 NOTE — Evaluation (Signed)
Physical Therapy Evaluation Patient Details Name: [name] MRN: [id number] DOB: [birth year] Today's Date: 04/24/2014   History of Present Illness  48 yo female admitted with seizure, HTN, hyperglycemia. hx of L midfoot amputation 2014, seizure, neuropathy, bipolar d/o, glaucoma, DM, HTN, Hep C  Clinical Impression  Pt pleasant, cooperative and states she does not want to lose her foot.  Pt also states "[name] told me to stay in the bed and not put wt on my foot"  Pt up in bathroom on own on PT arrival to room.  Reinforced need for NWB on foot and educated pt on pivot transfers bed to [facility]  Which pt achieved with min assist and cueing.  RN requesting clarification on any WB allowed on R UE.  At this time pt,s functional mobility is greatly limited by NWB on L LE and min use of R UE and she would greatly benefit from follow up rehab at SNF level to maximize IND, safety and healing of L foot prior to return home with ltd assist.   Follow Up Recommendations SNF    Equipment Recommendations  None recommended by PT    Recommendations for Other Services OT consult     Precautions / Restrictions Precautions Precautions: Fall Restrictions Weight Bearing Restrictions: Yes RUE Weight Bearing: Non weight bearing (no orders on UE WB - assumed NWB until clarifed - splint in ) LUE Weight Bearing: Weight bearing as tolerated LLE Weight Bearing: Non weight bearing Other Position/Activity Restrictions: Strict NWB on L LE      Mobility  Bed Mobility Overal bed mobility: Modified Independent                Transfers Overall transfer level: Needs assistance   Transfers: Sit to/from Stand;Stand Pivot Transfers Sit to Stand: Min assist Stand pivot transfers: Min assist       General transfer comment: cues for transition position, use of L UE and NWB on L LE for completion of task  Ambulation/Gait                Stairs            Wheelchair Mobility    Modified  Rankin (Stroke Patients Only)       Balance                                             Pertinent Vitals/Pain      Home Living Family/patient expects to be discharged to:: Skilled nursing facility                      Prior Function Level of Independence: Independent         Comments: IND but full WB on L LE     Hand Dominance   Dominant Hand: Left    Extremity/Trunk Assessment   Upper Extremity Assessment: RUE deficits/detail RUE Deficits / Details: elbow splinted in flex         Lower Extremity Assessment: LLE deficits/detail   LLE Deficits / Details: strength/ROM WFL - dressing in place L foot  Cervical / Trunk Assessment: Normal  Communication   Communication: No difficulties  Cognition Arousal/Alertness: Awake/alert Behavior During Therapy: WFL for tasks assessed/performed Overall Cognitive Status: Within Functional Limits for tasks assessed  General Comments General comments (skin integrity, edema, etc.): Pt states she does not want to "lose her foot" and that [name] wants her to stay in bed.      Exercises        Assessment/Plan    PT Assessment Patient needs continued PT services  PT Diagnosis Difficulty walking   PT Problem List Decreased balance;Decreased mobility;Decreased knowledge of use of DME;Obesity;Pain  PT Treatment Interventions DME instruction;Gait training;Functional mobility training;Therapeutic activities;Therapeutic exercise;Balance training;Patient/family education   PT Goals (Current goals can be found in the Care Plan section) Acute Rehab PT Goals Patient Stated Goal: I dont want to lose my foot PT Goal Formulation: With patient Time For Goal Achievement: 05/08/14 Potential to Achieve Goals: Good    Frequency Min 3X/week   Barriers to discharge        Co-evaluation               End of Session Equipment Utilized During Treatment: Gait belt Activity  Tolerance: Patient tolerated treatment well Patient left: in bed;with call Kramme/phone within reach Nurse Communication: Mobility status;Weight bearing status;Other (comment) (need for use of BSC at this time and clarification of UE WB)         Time: [id number] PT Time Calculation (min): 20 min   Charges:   PT Evaluation $Initial PT Evaluation Tier I: 1 Procedure PT Treatments $Therapeutic Activity: 8-22 mins   PT G Codes:          [name] 04/24/2014, 10:57 AM

## 2014-04-24 NOTE — ED Provider Notes (Signed)
Medical screening examination/treatment/procedure(s) were conducted as a shared visit with non-physician practitioner(s) and myself.  I personally evaluated the patient during the encounter.  Pt with hx diabetes, w infected surgical/foot wound, hyperglycemia, renal insuff.  Labs. Ivf. Admit.    Mirna Mires, MD 04/24/14 [phone]

## 2014-04-24 NOTE — Progress Notes (Signed)
Patient ID: [name], female   DOB: Nov 24, 1965, 48 y.o.   MRN: [id number] [facility] PROGRESS NOTE  [name] [id number] DOB: [birth year] DOA: 04/22/2014 PCP: Maurice Small, D, MD  Brief narrative:  48 year old female with hypertension, uncontrolled diabetes, CKD stage III, chronic diastolic CHF, and medical noncompliance presented with two-day history of worsening lower extremity edema and pain, associated with subjective fevers, chills. She also reported that her chronic left foot ulceration has not been healing but denied any worsening erythema or drainage. The patient was discharged from the hospital on 04/14/2014 with instructions to take doxycycline, but the patient never took it. Pt also reported one episode of fall 02/15/2014 resulting in right distal humerus fracture on 02/15/2014, never follow up with ortho. She had a colonoscopy on 06/22/2013 which revealed small internal and external hemorrhoids.   In the emergency department, the patient was noted to have a serum glucose of 447, serum creatinine 2.20 and sodium 125. WBC was 5.9, sedimentation rate88. Hemoglobin was 6.2.   Assessment and Plan:   Hyperosmolar Nonketotic State  - Presenting serum glucose of 447 with normal anion gap, CBG stable - no repeat BMP this AM to minimize blood loss as pt is Jehovah witness and does not want transfusions  Dyspnea on exertion  - clinically stable this AM, maintaining oxygen saturation at target ranfe  - Multifactorial including deconditioning, OHS, Anemia  - low clinical suspicion of PE  Acute on chronic kidney disease, stage III  - no blood work this AM as noted above  Lower extremity edema  - left Venous duplex negative for DVT  Chronic diastolic dysfunction  - 0000000 echocardiogram shows EF [id number], grade 1 diastolic dysfunction, no WMA  - proBNP to be elevated in the setting of CKD  - not volume overloaded on exam  - 2 D ECHO with grade I diastolic dysfunction  -  weight this AM is 196 lbs and remains stable over the past 24 hours  Acute on chronic blood loss anemia  - The patient is a Jehovah's witness  - Colonoscopy on 06/22/2013 showed small internal and external hemorrhoids  - minimize blood work  - appreciate GI input  Trichomonas vaginitis  - Flagyl 500 mg twice a day  - pyuria is likely due to her Trichomonas vaginitis, but will follow urine culture  - HIV antibody pending  Right distal humerus fracture/diabetic foot ulcer  - The diabetic foot ulcer on the left foot is not clinically infected on exam  - ortho consulted, appreciate recommendations  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  - Generalized edema lower extremities, patient reports this is some better over the past week  - Heavy callous formation near wound edges/periwound  - continue wound care   DVT prophylaxis  Lovenox SQ while pt is in hospital  Code Status: Full  Family Communication: Pt at bedside  Disposition Plan: SW consult for d/c plan to SNF   IV Access:   Peripheral IV Procedures and diagnostic studies:   Dg Chest 2 View 04/22/2014 No acute cardiopulmonary disease.  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  Dg Foot Complete Left 04/22/2014 Postoperative  changes as described. Persistent soft tissue defect is noted distally without definitive osteomyelitis.  Medical Consultants:   Ortho  GI Other Consultants:   Physical therapy  Anti-Infectives:   Flagyl 9/18 -->  Maxipime 9/18 -->    [name], MD  [facility] Pager [phone]  If 7PM-7AM, please contact  night-coverage [email or web address] Password [email or web address] 04/24/2014, 2:41 PM   LOS: 2 days   HPI/Subjective: No events overnight.   Objective: Filed Vitals:   04/23/14 2126 04/24/14 0526 04/24/14 1156 04/24/14 1359  BP: 136/76 123/70 135/71 121/71  Pulse: 84 77  88  Temp: 97.5 F (36.4 C) 98.1 F (36.7 C)  98.2 F (36.8 C)  TempSrc: Oral Oral  Oral  Resp: 18 18  18   Height:      Weight:      SpO2: 100% 100%  100%   No intake or output data in the 24 hours ending 04/24/14 1441  Exam:   General:  Pt is alert, follows commands appropriately, not in acute distress  Cardiovascular: Regular rate and rhythm, S1/S2, no murmurs, no rubs, no gallops  Respiratory: Clear to auscultation bilaterally, no wheezing, no crackles, no rhonchi  Abdomen: Soft, non tender, non distended, bowel sounds present, no guarding  Extremities: left foot pressure ulcer on the plantar aspect of the foot, superficial, approximately 5 cm in diameter.  Neuro: Grossly nonfocal  Data Reviewed: Basic Metabolic Panel:  Recent Labs Lab 04/22/14 0731 04/22/14 1705 04/23/14 0520  NA 125*  --  135*  K 3.9  --  4.8  CL 95*  --  102  CO2 19  --  21  GLUCOSE 447* 489* 121*  BUN 46*  --  47*  CREATININE 2.20*  --  2.43*  CALCIUM 8.4  --  8.2*   Liver Function Tests:  Recent Labs Lab 04/22/14 1409  AST 24  ALT 24  ALKPHOS 101  BILITOT 0.2*  PROT 7.2  ALBUMIN 2.1*   CBC:  Recent Labs Lab 04/22/14 0731 04/23/14 0520  WBC 5.9 3.0*  NEUTROABS 4.1  --   HGB 6.2* 5.4*  HCT 18.8* 16.8*  MCV 86.2 85.7  PLT 265 196   CBG:  Recent Labs Lab 04/23/14 1134 04/23/14 1706 04/23/14 2124 04/24/14 0733 04/24/14 1142  GLUCAP 303* 169* 215* 134* 116*    Recent Results (from the past 240 hour(s))  URINE CULTURE     Status: None   Collection Time    04/22/14  6:58 AM      Result Value Ref Range Status   Specimen Description URINE, CLEAN CATCH   Final   Special Requests Normal   Final   Culture   Setup Time     Final   Value: 04/22/2014 12:19     Performed at [facility] PENDING   Incomplete   Culture     Final   Value: Culture reincubated for better growth     Performed at [facility]   Report Status PENDING   Incomplete     Scheduled Meds: . carvedilol  25 mg Oral BID  . ceFEPime (MAXIPIME) IV  2 g Intravenous Q24H  . collagenase   Topical Daily  . DULoxetine  60 mg Oral Daily  . ferrous sulfate  325 mg Oral TID WC  . folic acid  [id number] mcg Oral q morning - 10a  . furosemide  20 mg Oral q morning - 10a  . insulin aspart  0-20 Units Subcutaneous TID WC  .  insulin aspart  0-5 Units Subcutaneous QHS  . insulin glargine  45 Units Subcutaneous QHS  . levETIRAcetam  500 mg Oral BID  . metroNIDAZOLE  500 mg Oral Q12H  . polyethylene glycol  17 g Oral BID  . pregabalin  75 mg Oral BID   Continuous Infusions:

## 2014-04-25 LAB — BASIC METABOLIC PANEL
ANION GAP: 12 (ref 5–15)
BUN: 58 mg/dL — ABNORMAL HIGH (ref 6–23)
CHLORIDE: 100 meq/L (ref 96–112)
CO2: 18 mEq/L — ABNORMAL LOW (ref 19–32)
Calcium: 8.3 mg/dL — ABNORMAL LOW (ref 8.4–10.5)
Creatinine, Ser: 2.9 mg/dL — ABNORMAL HIGH (ref 0.50–1.10)
GFR calc Af Amer: 21 mL/min — ABNORMAL LOW (ref >90)
GFR calc non Af Amer: 18 mL/min — ABNORMAL LOW (ref >90)
GLUCOSE: 186 mg/dL — AB (ref 70–99)
Potassium: 5.7 mEq/L — ABNORMAL HIGH (ref 3.7–5.3)
Sodium: 130 mEq/L — ABNORMAL LOW (ref 137–147)

## 2014-04-25 LAB — GLUCOSE, CAPILLARY
GLUCOSE-CAPILLARY: 208 mg/dL — AB (ref 70–99)
Glucose-Capillary: 203 mg/dL — ABNORMAL HIGH (ref 70–99)
Glucose-Capillary: 149 mg/dL — ABNORMAL HIGH (ref 70–99)

## 2014-04-25 LAB — GC/CHLAMYDIA PROBE AMP
CT PROBE, AMP APTIMA: NEGATIVE
GC Probe RNA: NEGATIVE

## 2014-04-25 MED ORDER — OXYCODONE-ACETAMINOPHEN 5-325 MG PO TABS: 1.0000 | ORAL_TABLET | ORAL | Status: DC | PRN

## 2014-04-25 MED ORDER — METRONIDAZOLE 500 MG PO TABS: 500.0000 mg | ORAL_TABLET | Freq: Two times a day (BID) | ORAL | Status: DC

## 2014-04-25 MED ORDER — FUROSEMIDE 20 MG PO TABS: 60.0000 mg | ORAL_TABLET | Freq: Every morning | ORAL | Status: DC

## 2014-04-25 MED ORDER — DEXTROSE 5 % IV SOLN
1.0000 g | INTRAVENOUS | Status: DC
  Administered 2014-04-25: 1 g via INTRAVENOUS
  Filled 2014-04-25: qty 1

## 2014-04-25 NOTE — Progress Notes (Addendum)
Nursing Discharge Summary  Patient ID: HANIFA STILLSON MRN: [id number] DOB/AGE: 13-Jun-1966 48 y.o.  Admit date: 04/22/2014 Discharge date: 04/25/2014  Discharged Condition: good  Disposition: [facility]  Follow-up Information   Follow up with DUDA,MARCUS V, MD In 2 weeks.   Specialty:  Orthopedic Surgery   Contact information:   [place] [address] [phone]       Schedule an appointment as soon as possible for a visit with [name], MD.   Specialty:  Family Medicine   Contact information:   [address] [address] [phone]       Prescriptions Given: [place] of Discharge: Ambulance  Report called to [place] LPN - Ambulance called. Awaiting transport.   Signed: Juanetta Snow 04/25/2014, 3:43 PM

## 2014-04-25 NOTE — Care Management Note (Signed)
    Page 1 of 1   04/25/2014     11:10:24 AM CARE MANAGEMENT NOTE 04/25/2014  Patient:  [name], [name]   Account Number:  [id number]  Date Initiated:  04/25/2014  Documentation initiated by:  Sunday Spillers  Subjective/Objective Assessment:   48 yo female admitted with foot infection. PTA lived at home with sister     Action/Plan:   home when stable   Anticipated DC Date:  04/25/2014   Anticipated DC Plan:  [facility]  CM consult      Choice offered to / List presented to:             Status of service:  Completed, signed off Medicare Important Message given?  YES (If response is "NO", the following Medicare IM given date fields will be blank) Date Medicare IM given:  04/25/2014 Medicare IM given by:  [facility] Date Additional Medicare IM given:   Additional Medicare IM given by:    Discharge Disposition:  [facility]  Per UR Regulation:  Reviewed for med. necessity/level of care/duration of stay  If discussed at [place] of Stay Meetings, dates discussed:    Comments:

## 2014-04-25 NOTE — Discharge Summary (Addendum)
Physician Discharge Summary  [name] [id number] DOB: [birth year] DOA: 04/22/2014  PCP: Maurice Small, D, MD  Admit date: 04/22/2014 Discharge date: 04/25/2014  Recommendations for Outpatient Follow-up:  1. Pt will need to follow up with PCP in 2-3 weeks post discharge 2. Please obtain BMP to evaluate electrolytes and kidney function, potassium level 3. Please note that pt has refused additional blood work in the hospital so will need outpatient follow up 4. Please also check CBC to evaluate Hg and Hct levels  Discharge Diagnoses:  Active Problems:   Diabetic foot ulcer   Anemia, iron deficiency   Diabetic foot infection   Diabetic hyperosmolar non-ketotic state   Rectal bleeding   Discharge Condition: Stable  Diet recommendation: Heart healthy diet discussed in details   Brief narrative:  48 year old female with hypertension, uncontrolled diabetes, CKD stage III, chronic diastolic CHF, and medical noncompliance presented with two-day history of worsening lower extremity edema and pain, associated with subjective fevers, chills. She also reported that her chronic left foot ulceration has not been healing but denied any worsening erythema or drainage. The patient was discharged from the hospital on 04/14/2014 with instructions to take doxycycline, but the patient never took it. Pt also reported one episode of fall 02/15/2014 resulting in right distal humerus fracture on 02/15/2014, never follow up with ortho. She had a colonoscopy on 06/22/2013 which revealed small internal and external hemorrhoids.   In the emergency department, the patient was noted to have a serum glucose of 447, serum creatinine 2.20 and sodium 125. WBC was 5.9, sedimentation rate88. Hemoglobin was 6.2.   Assessment and Plan:   Hyperosmolar Nonketotic State  - Presenting serum glucose of 447 with normal anion gap, CBG stable  Dyspnea on exertion  - clinically stable this AM, maintaining oxygen saturation at  target ranfe  - Multifactorial including deconditioning, OHS, Anemia  - low clinical suspicion of PE  Acute on chronic kidney disease, stage III  - Cr up this AM - encouraged PO intake - pt refusing additional blood work so will need close outpatient follow up Lower extremity edema  - left Venous duplex negative for DVT  Chronic diastolic dysfunction  - 0000000 echocardiogram shows EF 123456, grade 1 diastolic dysfunction, no WMA  - proBNP to be elevated in the setting of CKD  - not volume overloaded on exam  - 2 D ECHO with grade I diastolic dysfunction  - weight this AM is 196 lbs and remains stable over the past 24 hours  Acute on chronic blood loss anemia  - The patient is a Jehovah's witness  - Colonoscopy on 06/22/2013 showed small internal and external hemorrhoids  - minimize blood work  - appreciate GI input  Trichomonas vaginitis  - Flagyl 500 mg twice a day upon discharge  - pyuria is likely due to her Trichomonas vaginitis, but will follow urine culture  Right distal humerus fracture/diabetic foot ulcer  - The diabetic foot ulcer on the left foot is not clinically infected on exam  - ortho consulted, appreciate recommendations  - cleared for d/c with an outpatient follow up  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  - Generalized edema lower extremities, patient reports this is some better over the past week  - Heavy callous formation near wound edges/periwound  - continue wound care upon discharge   Code Status: Full  Family Communication: Pt at bedside  Disposition Plan: SNF  IV Access:    Peripheral IV Procedures and diagnostic studies:   Dg Chest 2 View 04/22/2014 No acute cardiopulmonary disease.  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  Dg Foot Complete Left 04/22/2014 Postoperative changes as described. Persistent soft tissue defect is noted distally without definitive osteomyelitis.  Medical Consultants:   Ortho  GI Other Consultants:   Physical therapy  Anti-Infectives:   Flagyl 9/18 -->  Maxipime 9/18 --> 9/21  Discharge Exam: Filed Vitals:   04/25/14 1320  BP: 139/77  Pulse: 79  Temp: 97.7 F (36.5 C)  Resp: 18   Filed Vitals:   04/24/14 1359 04/24/14 2143 04/25/14 0539 04/25/14 1320  BP: 121/71 139/75 148/79 139/77  Pulse: 88 94 85 79  Temp: 98.2 F (36.8 C) 98.9 F (37.2 C) 98.4 F (36.9 C) 97.7 F (36.5 C)  TempSrc: Oral Oral Oral Axillary  Resp: 18 18 18 18   Height:      Weight:      SpO2: 100% 99% 100% 100%    General: Pt is alert, follows commands appropriately, not in acute distress Cardiovascular: Regular rate and rhythm, no rubs, no gallops Respiratory: Clear to auscultation bilaterally, no wheezing, no crackles, no rhonchi Abdominal: Soft, non tender, non distended, bowel sounds +, no guarding  Discharge Instructions  Discharge Instructions   Diet - low sodium heart healthy    Complete by:  As directed      Increase activity slowly    Complete by:  As directed             Medication List         carvedilol 25 MG tablet  Commonly known as:  COREG  Take 1 tablet (25 mg total) by mouth 2 (two) times daily.     cloNIDine 0.1 MG tablet  Commonly known as:  CATAPRES  Take 0.1 mg by mouth daily.     DULoxetine 60 MG capsule  Commonly known as:  CYMBALTA  Take 60 mg by mouth daily.     ferrous sulfate 325 (65 FE) MG tablet  Take 1 tablet (325 mg total) by mouth 3 (three) times daily with meals.     folic acid A999333 MCG tablet  Commonly known as:   FOLVITE  Take 400 mcg by mouth every morning.     furosemide 20 MG tablet  Commonly known as:  LASIX  Take 60 mg by mouth every morning.     insulin aspart 100 UNIT/ML injection  Commonly known as:  novoLOG  Inject 2-10 Units into the skin 3 (three) times daily before meals. SSI: 0-200 = 2 units, 201-250 = 4 units, 251-300 = 6 units, 301-350 = 8 units, 351-400 = 10 units     insulin glargine 100 UNIT/ML injection  Commonly known as:  LANTUS  Inject 45 Units into the skin at bedtime.     levETIRAcetam 500 MG tablet  Commonly known as:  KEPPRA  Take 500 mg by mouth 2 (two) times daily.     metroNIDAZOLE 500 MG tablet  Commonly known as:  FLAGYL  Take 1 tablet (500 mg total) by mouth every 12 (twelve) hours.  oxyCODONE-acetaminophen 5-325 MG per tablet  Commonly known as:  PERCOCET/ROXICET  Take 1-2 tablets by mouth every 4 (four) hours as needed for moderate pain.     polyethylene glycol packet  Commonly known as:  MIRALAX / GLYCOLAX  Take 17 g by mouth 2 (two) times daily.     pregabalin 75 MG capsule  Commonly known as:  LYRICA  Take 75 mg by mouth 2 (two) times daily.     SENIOR MULTIVITAMIN PLUS PO  Take 1 tablet by mouth every morning.     silver sulfADIAZINE 1 % cream  Commonly known as:  SILVADENE  Apply topically daily.     simethicone 80 MG chewable tablet  Commonly known as:  MYLICON  Chew 80 mg by mouth every 8 (eight) hours as needed for flatulence.           Follow-up Information   Follow up with DUDA,MARCUS V, MD In 2 weeks.   Specialty:  Orthopedic Surgery   Contact information:   [place] [address] [phone]       Schedule an appointment as soon as possible for a visit with [name], MD.   Specialty:  Family Medicine   Contact information:   [place] [address] [phone]        The results of significant diagnostics from this hospitalization (including imaging,  microbiology, ancillary and laboratory) are listed below for reference.     Microbiology: Recent Results (from the past 240 hour(s))  URINE CULTURE     Status: None   Collection Time    04/22/14  6:58 AM      Result Value Ref Range Status   Specimen Description URINE, CLEAN CATCH   Final   Special Requests Normal   Final   Culture  Setup Time     Final   Value: 04/22/2014 12:19     Performed at [facility]     Final   Value: >=100,000 COLONIES/ML     Performed at [facility]   Culture     Final   Value: Multiple bacterial morphotypes present, none predominant. Suggest appropriate recollection if clinically indicated.     Performed at [facility]   Report Status 04/24/2014 FINAL   Final     Labs: Basic Metabolic Panel:  Recent Labs Lab 04/22/14 0731 04/22/14 1705 04/23/14 0520 04/25/14 0500  NA 125*  --  135* 130*  K 3.9  --  4.8 5.7*  CL 95*  --  102 100  CO2 19  --  21 18*  GLUCOSE 447* 489* 121* 186*  BUN 46*  --  47* 58*  CREATININE 2.20*  --  2.43* 2.90*  CALCIUM 8.4  --  8.2* 8.3*   Liver Function Tests:  Recent Labs Lab 04/22/14 1409  AST 24  ALT 24  ALKPHOS 101  BILITOT 0.2*  PROT 7.2  ALBUMIN 2.1*   No results found for this basename: LIPASE, AMYLASE,  in the last 168 hours No results found for this basename: AMMONIA,  in the last 168 hours CBC:  Recent Labs Lab 04/22/14 0731 04/23/14 0520  WBC 5.9 3.0*  NEUTROABS 4.1  --   HGB 6.2* 5.4*  HCT 18.8* 16.8*  MCV 86.2 85.7  PLT 265 196   Cardiac Enzymes: No results found for this basename: CKTOTAL, CKMB, CKMBINDEX, TROPONINI,  in the last 168 hours BNP: BNP (last 3  results)  Recent Labs  02/21/14 0104 02/23/14 0224 04/12/14 0520  PROBNP 930.6* 2305.0* 12801.0*   CBG:  Recent Labs Lab 04/24/14 1142 04/24/14 1739 04/24/14 2209 04/25/14 0736 04/25/14 1201  GLUCAP 116* 313* 299* 203* 149*     SIGNED: Time coordinating discharge:  Over 30 minutes  [name], MD  [facility] 04/25/2014, 2:30 PM Pager [phone]  If 7PM-7AM, please contact night-coverage [email or web address] Password [email or web address]

## 2014-04-25 NOTE — Progress Notes (Addendum)
Clinical Social Work Department CLINICAL SOCIAL WORK PLACEMENT NOTE 04/25/2014  Patient:  [name], [name]  Account Number:  [id number] Admit date:  04/22/2014  Clinical Social Worker:  Sindy Messing, LCSW  Date/time:  04/25/2014 10:15 AM  Clinical Social Work is seeking post-discharge placement for this patient at the following level of care:   [facility]   (*CSW will update this form in Epic as items are completed)   04/25/2014  Patient/family provided with [facility] Department of Clinical Social Work's list of facilities offering this level of care within the geographic area requested by the patient (or if unable, by the patient's family).  04/25/2014  Patient/family informed of their freedom to choose among providers that offer the needed level of care, that participate in Medicare, Medicaid or managed care program needed by the patient, have an available bed and are willing to accept the patient.  04/25/2014  Patient/family informed of [facility]' ownership interest in [facility], as well as of the fact that they are under no obligation to receive care at this facility.  PASARR submitted to EDS on existing # PASARR number received on   FL2 transmitted to all facilities in geographic area requested by pt/family on  04/25/2014 FL2 transmitted to all facilities within larger geographic area on   Patient informed that his/her managed care company has contracts with or will negotiate with  certain facilities, including the following:     Patient/family informed of bed offers received:  04/25/2014 Patient chooses bed at [facility] Physician recommends and patient chooses bed at    Patient to be transferred to [facility] on  04/25/14 Patient to be transferred to facility by PTAR Patient and family notified of transfer on 04/25/14 Name of family member notified:  Patient declined for CSW to contact family and reports she will update them on plans  The  following physician request were entered in Epic:   Additional Comments:

## 2014-04-25 NOTE — Progress Notes (Signed)
Clinical Social Work Department BRIEF PSYCHOSOCIAL ASSESSMENT 04/25/2014  Patient:  [name], [name]     Account Number:  [id number]     Admit date:  04/22/2014  Clinical Social Worker:  Earlie Server  Date/Time:  04/25/2014 10:15 AM  Referred by:  Physician  Date Referred:  04/25/2014 Referred for  SNF Placement   Other Referral:   Interview type:  Patient Other interview type:    PSYCHOSOCIAL DATA Living Status:  FAMILY Admitted from facility:   Level of care:   Primary support name:  Marlowe Kays Primary support relationship to patient:  SIBLING Degree of support available:   Adequate    CURRENT CONCERNS Current Concerns  Post-Acute Placement   Other Concerns:    SOCIAL WORK ASSESSMENT / PLAN CSW received referral in order to assist with DC planning. CSW reviewed chart and met with patient at bedside. CSW introduced myself and explained role.    Patient reports that she was living at home with friends prior to admission. Patient reports that she is not doing well and feels she needs SNF at DC. CSW provided SNF list and explained process. Patient has been to [facility] in the past and would like to return if they can accept. CSW encouraged patient to review list and have alternative options in case [facility] is unable to accept.    CSW completed FL2 and faxed out. CSW will follow up with bed offers.   Assessment/plan status:  Psychosocial Support/Ongoing Assessment of Needs Other assessment/ plan:   Information/referral to community resources:   SNF list    PATIENT'S/FAMILY'S RESPONSE TO PLAN OF CARE: Patient alert and oriented and engaged in assessment. Patient reports she was trying to care for herself prior to admission but knows that it would be best to get rehab prior to returning home. Patient reports she is familiar with [facility] and would like to return because she knows what to expect. Patient thanked CSW for information and agreeable to  continued follow up.       [place], [place] [phone]

## 2014-04-25 NOTE — Evaluation (Signed)
Occupational Therapy Evaluation Patient Details Name: [name] MRN: [id number] DOB: Dec 14, 1965 Today's Date: 04/25/2014    History of Present Illness 48 yo female admitted with seizure, HTN, hyperglycemia. hx of L midfoot amputation 2014, seizure, neuropathy, bipolar d/o, glaucoma, DM, HTN, Hep C   Clinical Impression   Pt presents to OT with decreased I with ADL activity due to problems below. Pt was up in bathroom on both feet upon OT arrival.  Pt will benefit from skilled OT to increase I with ADL activity following precautions.    Follow Up Recommendations  SNF    Equipment Recommendations  None recommended by OT    Recommendations for Other Services       Precautions / Restrictions Precautions Precautions: Fall Restrictions Weight Bearing Restrictions: Yes RUE Weight Bearing: Non weight bearing LLE Weight Bearing: Non weight bearing      Mobility Bed Mobility Overal bed mobility: Modified Independent                Transfers Overall transfer level: Needs assistance     Sit to Stand: Min assist Stand pivot transfers: Min assist       General transfer comment: cues for transition position, use of L UE and NWB on L LE for completion of task         ADL Overall ADL's : Needs assistance/impaired         Upper Body Bathing: Minimal assitance;Sitting   Lower Body Bathing: Maximal assistance;Sitting/lateral leans           Toilet Transfer: Minimal assistance;Cueing for safety   Toileting- Clothing Manipulation and Hygiene: Minimal assistance;Cueing for safety         General ADL Comments: pt in bathroom on both feet upon OT arrival. educated on pivot transfers               Pertinent Vitals/Pain Pain Assessment: 0-10 Pain Score: 3  Pain Location: R elbow Pain Descriptors / Indicators: Aching Pain Intervention(s): Monitored during session;Premedicated before session     Hand Dominance Left   Extremity/Trunk Assessment  Upper Extremity Assessment Upper Extremity Assessment: RUE deficits/detail RUE Deficits / Details: elbow splinted in flex RUE Coordination: decreased fine motor (edema noted in fingers- but pt able to move fingers and perform opposition with increased time)   Lower Extremity Assessment LLE Deficits / Details: strength/ROM WFL - dressing in place L foot   Cervical / Trunk Assessment Cervical / Trunk Assessment: Normal   Communication Communication Communication: No difficulties   Cognition Arousal/Alertness: Awake/alert Behavior During Therapy: WFL for tasks assessed/performed Overall Cognitive Status: Within Functional Limits for tasks assessed                     General Comments       Exercises       Shoulder Instructions      Home Living Family/patient expects to be discharged to:: Skilled nursing facility                                        Prior Functioning/Environment Level of Independence: Independent        Comments: IND but full WB on L LE    OT Diagnosis: Generalized weakness;Acute pain   OT Problem List: Decreased strength;Decreased activity tolerance;Decreased knowledge of precautions   OT Treatment/Interventions: Self-care/ADL training;DME and/or AE instruction;Patient/family education    OT Goals(Current goals can  be found in the care plan section)    OT Frequency: Min 2X/week   Barriers to D/C:            Co-evaluation              End of Session Nurse Communication: Mobility status  Activity Tolerance: Patient tolerated treatment well Patient left: in bed;with call Rami/phone within reach   Time: [id number] OT Time Calculation (min): 18 min Charges:  OT General Charges $OT Visit: 1 Procedure OT Evaluation $Initial OT Evaluation Tier I: 1 Procedure OT Treatments $Self Care/Home Management : 8-22 mins G-Codes:    Payton Mccallum D 05-10-2014, 11:24 AM

## 2014-04-25 NOTE — Progress Notes (Signed)
Clinical Social Work  Patient chose a bed at [facility] who is ready to accept today. CSW faxed DC summary and prepared DC packet with FL2, DC summary, and hard scripts included. RN to call report. Patient alert and aware of DC plans and request PTAR for transportation and is aware of no guarantee of payment. Patient reports she will update family re: DC plans. PTAR #: [id number].  CSW is signing off but available if needed.  [place], [place] [phone]

## 2014-04-25 NOTE — Discharge Instructions (Signed)
Wound Care Wound care helps prevent pain and infection.  You may need a tetanus shot if:  You cannot remember when you had your last tetanus shot.  You have never had a tetanus shot.  The injury broke your skin. If you need a tetanus shot and you choose not to have one, you may get tetanus. Sickness from tetanus can be serious. HOME CARE   Only take medicine as told by your doctor.  Clean the wound daily with mild soap and water.  Change any bandages (dressings) as told by your doctor.  Put medicated cream and a bandage on the wound as told by your doctor.  Change the bandage if it gets wet, dirty, or starts to smell.  Take showers. Do not take baths, swim, or do anything that puts your wound under water.  Rest and raise (elevate) the wound until the pain and puffiness (swelling) are better.  Keep all doctor visits as told. GET HELP RIGHT AWAY IF:   Yellowish-white fluid (pus) comes from the wound.  Medicine does not lessen your pain.  There is a red streak going away from the wound.  You have a fever. MAKE SURE YOU:   Understand these instructions.  Will watch your condition.  Will get help right away if you are not doing well or get worse. Document Released: 04/30/2008 Document Revised: 10/14/2011 Document Reviewed: 11/25/2010 ExitCare Patient Information 2015 [employer]. This information is not intended to replace advice given to you by your health care provider. Make sure you discuss any questions you have with your health care provider.  

## 2014-04-25 NOTE — Progress Notes (Signed)
ANTIBIOTIC CONSULT NOTE - Follow-up  Pharmacy Consult for Cefepime Indication: wound infection / UTI  Allergies  Allergen Reactions  . Gabapentin Itching and Swelling  . Saphris [Asenapine] Swelling    Swelling of the face  . Tramadol Swelling    Causes legs to swell  . Penicillins Hives    Has tolerated Cefepime and Ceftriaxone.    Patient Measurements: Height: 5\' 4"  (162.6 cm) Weight: 196 lb 6.9 oz (89.1 kg) IBW/kg (Calculated) : 54.7  Vital Signs: Temp: 97.7 F (36.5 C) (09/21 1320) Temp src: Axillary (09/21 1320) BP: 139/77 mmHg (09/21 1320) Pulse Rate: 79 (09/21 1320) Intake/Output from previous day:   Intake/Output from this shift: Total I/O In: 600 [P.O.:600] Out: -   Labs:  Recent Labs  04/23/14 0520 04/25/14 0500  WBC 3.0*  --   HGB 5.4*  --   PLT 196  --   CREATININE 2.43* 2.90*   Estimated Creatinine Clearance: 25.9 ml/min (by C-G formula based on Cr of 2.9). No results found for this basename: VANCOTROUGH, Corlis Leak, VANCORANDOM, GENTTROUGH, GENTPEAK, GENTRANDOM, TOBRATROUGH, TOBRAPEAK, TOBRARND, AMIKACINPEAK, AMIKACINTROU, AMIKACIN,  in the last 72 hours   Microbiology: Recent Results (from the past 720 hour(s))  URINE CULTURE     Status: None   Collection Time    04/11/14  6:32 PM      Result Value Ref Range Status   Specimen Description URINE, CLEAN CATCH   Final   Special Requests Normal   Final   Culture  Setup Time     Final   Value: 04/12/2014 02:50     Performed at [facility]     Final   Value: 50,000 COLONIES/ML     Performed at [facility]   Culture     Final   Value: Multiple bacterial morphotypes present, none predominant. Suggest appropriate recollection if clinically indicated.     Performed at [facility]   Report Status 04/13/2014 FINAL   Final  MRSA PCR SCREENING     Status: None   Collection Time    04/12/14  3:57 AM      Result Value Ref Range Status   MRSA by PCR NEGATIVE   NEGATIVE Final   Comment:            The GeneXpert MRSA Assay (FDA     approved for NASAL specimens     only), is one component of a     comprehensive MRSA colonization     surveillance program. It is not     intended to diagnose MRSA     infection nor to guide or     monitor treatment for     MRSA infections.  URINE CULTURE     Status: None   Collection Time    04/22/14  6:58 AM      Result Value Ref Range Status   Specimen Description URINE, CLEAN CATCH   Final   Special Requests Normal   Final   Culture  Setup Time     Final   Value: 04/22/2014 12:19     Performed at [facility]     Final   Value: >=100,000 COLONIES/ML     Performed at [facility]   Culture     Final   Value: Multiple bacterial morphotypes present, none predominant. Suggest appropriate recollection if clinically indicated.     Performed at [facility]   Report Status 04/24/2014  FINAL   Final    Medical History: Past Medical History  Diagnosis Date  . Neuropathy   . Glaucoma   . Bipolar 1 disorder   . CHF (congestive heart failure)   . Refusal of blood transfusions as patient is Jehovah's Witness   . Hypertension   . Shortness of breath   . Diabetes mellitus     INSULIN DEPENDENT  . TIA (transient ischemic attack)   . Neuromuscular disorder     DIABETIC NEUROPATHY  . Chronic kidney disease   . Hepatitis C virus   . Seizure     diagnosed 2014  . MRSA infection   . Bipolar disorder, unspecified 02/06/2014  . Schizoaffective disorder, unspecified condition 02/08/2014     Assessment: 23 yoF with PMH of DM, neuropathy, CKD, CHF, HTN and foot amputation. Was admitted 9/7 for diabetic foot ulcer and cellulitis of left foot, discharged on 9/10 on antibiotics. Pt did not fill antibiotics. Now presents with increased pain and swelling in left lower leg. Pharmacy consulted to dose Cefepime for wound infection / UTI.   Tmax: afebrile WBCs: WNL Renal: SCr 2.9 CrCl  27 N   Goal of Therapy:  Appropriate antibiotic dosing for renal function; eradication of infection  Plan:  Decrease Cefepime to 1g IV Q24H due to rising SCr Follow Scr, adjust for CrCl  Dolly Rias RPh 04/25/2014, 2:13 PM Pager [phone]

## 2014-04-27 ENCOUNTER — Encounter (HOSPITAL_COMMUNITY): Payer: Self-pay | Admitting: Emergency Medicine

## 2014-04-27 ENCOUNTER — Emergency Department (HOSPITAL_COMMUNITY): Payer: Medicare Other

## 2014-04-27 ENCOUNTER — Emergency Department (HOSPITAL_COMMUNITY)
Admission: EM | Admit: 2014-04-27 | Discharge: 2014-04-28 | Disposition: A | Discharge status: Home / Self Care | Payer: Medicare Other | Attending: Emergency Medicine | Admitting: Emergency Medicine

## 2014-04-27 DIAGNOSIS — D649 Anemia, unspecified: Secondary | ICD-10-CM

## 2014-04-27 DIAGNOSIS — I509 Heart failure, unspecified: Secondary | ICD-10-CM | POA: Diagnosis not present

## 2014-04-27 DIAGNOSIS — E1142 Type 2 diabetes mellitus with diabetic polyneuropathy: Secondary | ICD-10-CM | POA: Insufficient documentation

## 2014-04-27 DIAGNOSIS — F319 Bipolar disorder, unspecified: Secondary | ICD-10-CM | POA: Insufficient documentation

## 2014-04-27 DIAGNOSIS — Z794 Long term (current) use of insulin: Secondary | ICD-10-CM | POA: Diagnosis not present

## 2014-04-27 DIAGNOSIS — I129 Hypertensive chronic kidney disease with stage 1 through stage 4 chronic kidney disease, or unspecified chronic kidney disease: Secondary | ICD-10-CM | POA: Insufficient documentation

## 2014-04-27 DIAGNOSIS — Z8673 Personal history of transient ischemic attack (TIA), and cerebral infarction without residual deficits: Secondary | ICD-10-CM | POA: Diagnosis not present

## 2014-04-27 DIAGNOSIS — R6889 Other general symptoms and signs: Secondary | ICD-10-CM | POA: Insufficient documentation

## 2014-04-27 DIAGNOSIS — N189 Chronic kidney disease, unspecified: Secondary | ICD-10-CM | POA: Insufficient documentation

## 2014-04-27 DIAGNOSIS — D509 Iron deficiency anemia, unspecified: Secondary | ICD-10-CM | POA: Insufficient documentation

## 2014-04-27 DIAGNOSIS — F17200 Nicotine dependence, unspecified, uncomplicated: Secondary | ICD-10-CM | POA: Insufficient documentation

## 2014-04-27 DIAGNOSIS — G40909 Epilepsy, unspecified, not intractable, without status epilepticus: Secondary | ICD-10-CM | POA: Insufficient documentation

## 2014-04-27 DIAGNOSIS — Z792 Long term (current) use of antibiotics: Secondary | ICD-10-CM | POA: Diagnosis not present

## 2014-04-27 DIAGNOSIS — Z8614 Personal history of Methicillin resistant Staphylococcus aureus infection: Secondary | ICD-10-CM | POA: Diagnosis not present

## 2014-04-27 DIAGNOSIS — E1149 Type 2 diabetes mellitus with other diabetic neurological complication: Secondary | ICD-10-CM | POA: Insufficient documentation

## 2014-04-27 DIAGNOSIS — Z8619 Personal history of other infectious and parasitic diseases: Secondary | ICD-10-CM | POA: Insufficient documentation

## 2014-04-27 DIAGNOSIS — Z79899 Other long term (current) drug therapy: Secondary | ICD-10-CM | POA: Diagnosis not present

## 2014-04-27 DIAGNOSIS — Z880 Allergy status to penicillin: Secondary | ICD-10-CM | POA: Diagnosis not present

## 2014-04-27 LAB — COMPREHENSIVE METABOLIC PANEL
ALK PHOS: 84 U/L (ref 39–117)
ALT: 26 U/L (ref 0–35)
ANION GAP: 9 (ref 5–15)
AST: 44 U/L — ABNORMAL HIGH (ref 0–37)
Albumin: 2.2 g/dL — ABNORMAL LOW (ref 3.5–5.2)
BUN: 45 mg/dL — AB (ref 6–23)
CO2: 23 mEq/L (ref 19–32)
Calcium: 8.7 mg/dL (ref 8.4–10.5)
Chloride: 97 mEq/L (ref 96–112)
Creatinine, Ser: 2.61 mg/dL — ABNORMAL HIGH (ref 0.50–1.10)
GFR calc Af Amer: 24 mL/min — ABNORMAL LOW (ref >90)
GFR calc non Af Amer: 21 mL/min — ABNORMAL LOW (ref >90)
Glucose, Bld: 154 mg/dL — ABNORMAL HIGH (ref 70–99)
Potassium: 5.5 mEq/L — ABNORMAL HIGH (ref 3.7–5.3)
Sodium: 129 mEq/L — ABNORMAL LOW (ref 137–147)
TOTAL PROTEIN: 7.5 g/dL (ref 6.0–8.3)
Total Bilirubin: <0.2 mg/dL — ABNORMAL LOW (ref 0.3–1.2)

## 2014-04-27 LAB — CBC WITH DIFFERENTIAL/PLATELET
BASOS ABS: 0 10*3/uL (ref 0.0–0.1)
Basophils Relative: 0 % (ref 0–1)
EOS ABS: 0.2 10*3/uL (ref 0.0–0.7)
EOS PCT: 5 % (ref 0–5)
HCT: 21.1 % — ABNORMAL LOW (ref 36.0–46.0)
HEMOGLOBIN: 6.6 g/dL — AB (ref 12.0–15.0)
LYMPHS PCT: 45 % (ref 12–46)
Lymphs Abs: 1.5 10*3/uL (ref 0.7–4.0)
MCH: 27.6 pg (ref 26.0–34.0)
MCHC: 31.3 g/dL (ref 30.0–36.0)
MCV: 88.3 fL (ref 78.0–100.0)
MONO ABS: 0.3 10*3/uL (ref 0.1–1.0)
Monocytes Relative: 10 % (ref 3–12)
Neutro Abs: 1.3 10*3/uL — ABNORMAL LOW (ref 1.7–7.7)
Neutrophils Relative %: 40 % — ABNORMAL LOW (ref 43–77)
Platelets: 268 10*3/uL (ref 150–400)
RBC: 2.39 MIL/uL — ABNORMAL LOW (ref 3.87–5.11)
RDW: 16.7 % — AB (ref 11.5–15.5)
WBC: 3.3 10*3/uL — ABNORMAL LOW (ref 4.0–10.5)

## 2014-04-27 LAB — POC OCCULT BLOOD, ED: Fecal Occult Bld: POSITIVE — AB

## 2014-04-27 NOTE — ED Provider Notes (Signed)
CSN: [id number]     Arrival date & time 04/27/14  1951 History   First MD Initiated Contact with Patient 04/27/14 1955     Chief Complaint  Patient presents with  . Abnormal Lab     (Consider location/radiation/quality/duration/timing/severity/associated sxs/prior Treatment) HPI 48 y.o. female with history as below presents from SNF where she resides for rehab from recent cellulitis and humerus fx with concerns for continued anemia.  Pt had routine repeat HGB draw which demonstrated hgb 6.3.  She is a Sales promotion account executive witness and has repeatedly refused blood transfusions despite being told she could die.  She denies new associated symptoms, however has some fatigue, dyspnea, and generalized weakness that was present prior to last admission to the hospital.  She has not had recent fever, gi symptoms.  Timing of symptoms is constant, and they are worsened by exertion.  Nursing home staff say they spoke with the NP of pt's PCP who informed them to send the pt to the ED. Past Medical History  Diagnosis Date  . Neuropathy   . Glaucoma   . Bipolar 1 disorder   . CHF (congestive heart failure)   . Refusal of blood transfusions as patient is Jehovah's Witness   . Hypertension   . Shortness of breath   . Diabetes mellitus     INSULIN DEPENDENT  . TIA (transient ischemic attack)   . Neuromuscular disorder     DIABETIC NEUROPATHY  . Chronic kidney disease   . Hepatitis C virus   . Seizure     diagnosed 2014  . MRSA infection   . Bipolar disorder, unspecified 02/06/2014  . Schizoaffective disorder, unspecified condition 02/08/2014   Past Surgical History  Procedure Laterality Date  . Abdominal hysterectomy    . Amputation Left 05/21/2013    Procedure: Left Midfoot AMPUTATION;  Surgeon: Newt Minion, MD;  Location: [facility];  Service: Orthopedics;  Laterality: Left;  Left Midfoot Amputation  . Tubal ligation    . Colonoscopy with propofol N/A 06/22/2013    Procedure: COLONOSCOPY WITH PROPOFOL;   Surgeon: Jeryl Columbia, MD;  Location: [facility];  Service: Endoscopy;  Laterality: N/A;   Family History  Problem Relation Age of Onset  . Hypertension Mother   . Hypertension Father    History  Substance Use Topics  . Smoking status: Current Every Day Smoker -- 0.25 packs/day for 30 years    Types: Cigarettes  . Smokeless tobacco: Never Used  . Alcohol Use: Yes     Comment: @ 1/week   OB History   Grav Para Term Preterm Abortions TAB SAB Ect Mult Living                 Review of Systems  Constitutional: Positive for fatigue. Negative for fever and chills.  HENT: Negative for congestion, rhinorrhea and sore throat.   Eyes: Negative for photophobia and visual disturbance.  Respiratory: Positive for shortness of breath. Negative for cough.   Cardiovascular: Negative for chest pain and leg swelling.  Gastrointestinal: Negative for nausea, vomiting, abdominal pain, diarrhea and constipation.  Endocrine: Negative for polyphagia and polyuria.  Genitourinary: Negative for dysuria, flank pain, vaginal bleeding, vaginal discharge and enuresis.  Musculoskeletal: Negative for back pain and gait problem.  Skin: Negative for color change and rash.  Neurological: Negative for dizziness, syncope, light-headedness and numbness.  Hematological: Negative for adenopathy. Does not bruise/bleed easily.  All other systems reviewed and are negative.     Allergies  Gabapentin; Saphris; Tramadol; and  Penicillins  Home Medications   Prior to Admission medications   Medication Sig Start Date End Date Taking? Authorizing Provider  carvedilol (COREG) 25 MG tablet Take 1 tablet (25 mg total) by mouth 2 (two) times daily. 04/14/14  Yes Nishant Dhungel, MD  cloNIDine (CATAPRES) 0.1 MG tablet Take 0.1 mg by mouth daily.   Yes Historical Provider, MD  cloNIDine (CATAPRES) 0.1 MG tablet Take 0.1 mg by mouth 3 (three) times daily as needed (for hypertension).   Yes Historical Provider, MD  DULoxetine  (CYMBALTA) 60 MG capsule Take 60 mg by mouth daily.   Yes Historical Provider, MD  ferrous sulfate 325 (65 FE) MG tablet Take 1 tablet (325 mg total) by mouth 3 (three) times daily with meals. 04/14/14  Yes Nishant Dhungel, MD  folic acid (FOLVITE) A999333 MCG tablet Take 400 mcg by mouth every morning.   Yes Historical Provider, MD  furosemide (LASIX) 20 MG tablet Take 3 tablets (60 mg total) by mouth every morning. 04/25/14  Yes Theodis Blaze, MD  insulin aspart (NOVOLOG) 100 UNIT/ML injection Inject 2-10 Units into the skin 3 (three) times daily before meals. SSI: 0-200 = 2 units, 201-250 = 4 units, 251-300 = 6 units, 301-350 = 8 units, 351-400 = 10 units   Yes Historical Provider, MD  insulin glargine (LANTUS) 100 UNIT/ML injection Inject 45 Units into the skin at bedtime.    Yes Historical Provider, MD  levETIRAcetam (KEPPRA) 500 MG tablet Take 500 mg by mouth 2 (two) times daily.   Yes Historical Provider, MD  metroNIDAZOLE (FLAGYL) 500 MG tablet Take 1 tablet (500 mg total) by mouth every 12 (twelve) hours. 04/25/14  Yes Theodis Blaze, MD  Multiple Vitamins-Minerals (SENIOR MULTIVITAMIN PLUS PO) Take 1 tablet by mouth every morning.   Yes Historical Provider, MD  oxyCODONE-acetaminophen (PERCOCET/ROXICET) 5-325 MG per tablet Take 1-2 tablets by mouth every 4 (four) hours as needed for moderate pain. 04/25/14  Yes Theodis Blaze, MD  polyethylene glycol (MIRALAX / Floria Raveling) packet Take 17 g by mouth 2 (two) times daily.   Yes Historical Provider, MD  pregabalin (LYRICA) 75 MG capsule Take 75 mg by mouth 2 (two) times daily.   Yes Historical Provider, MD  silver sulfADIAZINE (SILVADENE) 1 % cream Apply topically daily. 04/14/14  Yes Nishant Dhungel, MD  simethicone (MYLICON) 80 MG chewable tablet Chew 80 mg by mouth every 8 (eight) hours as needed for flatulence.   Yes Historical Provider, MD   BP 124/79  Pulse 75  Resp 8  SpO2 100% Physical Exam  Vitals reviewed. Constitutional: She is oriented to  person, place, and time. She appears well-developed and well-nourished.  HENT:  Head: Normocephalic and atraumatic.  Right Ear: External ear normal.  Left Ear: External ear normal.  Eyes: Conjunctivae and EOM are normal. Pupils are equal, round, and reactive to light.  Conjunctival pallor  Neck: Normal range of motion. Neck supple.  Cardiovascular: Normal rate, regular rhythm, normal heart sounds and intact distal pulses.   Pulmonary/Chest: Effort normal and breath sounds normal.  Abdominal: Soft. Bowel sounds are normal. There is no tenderness.  Musculoskeletal: Normal range of motion.  Neurological: She is alert and oriented to person, place, and time.  Skin: Skin is warm and dry.    ED Course  Procedures (including critical care time) Labs Review Labs Reviewed  COMPREHENSIVE METABOLIC PANEL - Abnormal; Notable for the following:    Sodium 129 (*)    Potassium 5.5 (*)  Glucose, Bld 154 (*)    BUN 45 (*)    Creatinine, Ser 2.61 (*)    Albumin 2.2 (*)    AST 44 (*)    Total Bilirubin <0.2 (*)    GFR calc non Af Amer 21 (*)    GFR calc Af Amer 24 (*)    All other components within normal limits  CBC WITH DIFFERENTIAL - Abnormal; Notable for the following:    WBC 3.3 (*)    RBC 2.39 (*)    Hemoglobin 6.6 (*)    HCT 21.1 (*)    RDW 16.7 (*)    Neutrophils Relative % 40 (*)    Neutro Abs 1.3 (*)    All other components within normal limits  CBC WITH DIFFERENTIAL  OCCULT BLOOD X 1 CARD TO LAB, STOOL    Imaging Review Dg Chest 2 View  04/27/2014   CLINICAL DATA:  Abnormal labs.  Hypertension and CHF.  EXAM: CHEST  2 VIEW  COMPARISON:  04/22/2014  FINDINGS: There is mild cardiomegaly which is stable from previous. Upper mediastinal contours are negative. There is no edema, consolidation, effusion, or pneumothorax.  IMPRESSION: 1. No active cardiopulmonary disease. 2. Mild cardiomegaly.   Electronically Signed   By: Jorje Guild M.D.   On: 04/27/2014 22:15     EKG  Interpretation   Date/Time:  Wednesday April 27 2014 20:31:05 EDT Ventricular Rate:  79 PR Interval:  170 QRS Duration: 93 QT Interval:  407 QTC Calculation: 467 R Axis:   85 Text Interpretation:  Sinus rhythm Consider left ventricular hypertrophy  Prolonged QT are no longer Confirmed by Debby Freiberg [phone]) on  04/27/2014 11:03:09 PM     HGB  Date Value Ref Range Status  07/20/2013 9.3* 11.6 - 15.9 g/dL Final     Hemoglobin  Date Value Ref Range Status  04/27/2014 6.6* 12.0 - 15.0 g/dL Final     CRITICAL RESULT CALLED TO, READ BACK BY AND VERIFIED WITH:     WILSON,T RN 04/27/2014 2204 JORDANS     REPEATED TO VERIFY  04/23/2014 5.4* 12.0 - 15.0 g/dL Final     REPEATED TO VERIFY     CRITICAL VALUE NOTED.  VALUE IS CONSISTENT WITH PREVIOUSLY REPORTED AND CALLED VALUE.  04/22/2014 6.2* 12.0 - 15.0 g/dL Final     REPEATED TO VERIFY     CRITICAL RESULT CALLED TO, READ BACK BY AND VERIFIED WITH:     N. DAVIS RN AT 0805 ON 09.18.15     BY SHUEA  04/14/2014 7.5* 12.0 - 15.0 g/dL Final     MDM   Final diagnoses:  None    48 y.o. female with pertinent PMH of CKD, chronic anemia, recent humeral fracture presents with continued anemia in setting of recent admission for same.  Pt is a Jehovah's witness who has repeatedly refused blood transfusions, as she has in the past.  The pt had routine post admission labwork which demonstrated a continued anemia, so she was sent for further evaluation.  She denies new symptoms, however stated that she wants her symptoms to be immediately better.   Labs and vitals as above on arrival.  Hgb improved from last admission.  Likely etiology multifactorial, and pt is on iron supplementation at this time.  Discussed potential for transfusion with pt again as most immediate way of alleviating symptoms, she continued to refuse and stated she would rather die.  I do not feel that iron transfusion in setting of chronic anemia  without new symptoms since dc  is warranted emergently, so feel pt stable for outpt therapy.  DC home in stable condition.    1. Chronic anemia   2. Iron deficiency anemia         Debby Freiberg, MD 04/28/14 [phone]

## 2014-04-27 NOTE — ED Notes (Signed)
Lab called, CBC clotted. To redraw.

## 2014-04-27 NOTE — Discharge Instructions (Signed)
Anemia, Nonspecific Anemia is a condition in which the concentration of red blood cells or hemoglobin in the blood is below normal. Hemoglobin is a substance in red blood cells that carries oxygen to the tissues of the body. Anemia results in not enough oxygen reaching these tissues.  CAUSES  Common causes of anemia include:   Excessive bleeding. Bleeding may be internal or external. This includes excessive bleeding from periods (in women) or from the intestine.   Poor nutrition.   Chronic kidney, thyroid, and liver disease.  Bone marrow disorders that decrease red blood cell production.  Cancer and treatments for cancer.  HIV, AIDS, and their treatments.  Spleen problems that increase red blood cell destruction.  Blood disorders.  Excess destruction of red blood cells due to infection, medicines, and autoimmune disorders. SIGNS AND SYMPTOMS   Minor weakness.   Dizziness.   Headache.  Palpitations.   Shortness of breath, especially with exercise.   Paleness.  Cold sensitivity.  Indigestion.  Nausea.  Difficulty sleeping.  Difficulty concentrating. Symptoms may occur suddenly or they may develop slowly.  DIAGNOSIS  Additional blood tests are often needed. These help your health care provider determine the best treatment. Your health care provider will check your stool for blood and look for other causes of blood loss.  TREATMENT  Treatment varies depending on the cause of the anemia. Treatment can include:   Supplements of iron, vitamin B12, or folic acid.   Hormone medicines.   A blood transfusion. This may be needed if blood loss is severe.   Hospitalization. This may be needed if there is significant continual blood loss.   Dietary changes.  Spleen removal. HOME CARE INSTRUCTIONS Keep all follow-up appointments. It often takes many weeks to correct anemia, and having your health care provider check on your condition and your response to  treatment is very important. SEEK IMMEDIATE MEDICAL CARE IF:   You develop extreme weakness, shortness of breath, or chest pain.   You become dizzy or have trouble concentrating.  You develop heavy vaginal bleeding.   You develop a rash.   You have bloody or black, tarry stools.   You faint.   You vomit up blood.   You vomit repeatedly.   You have abdominal pain.  You have a fever or persistent symptoms for more than 2-3 days.   You have a fever and your symptoms suddenly get worse.   You are dehydrated.  MAKE SURE YOU:  Understand these instructions.  Will watch your condition.  Will get help right away if you are not doing well or get worse. Document Released: 08/29/2004 Document Revised: 03/24/2013 Document Reviewed: 01/15/2013 ExitCare Patient Information 2015 [employer]. This information is not intended to replace advice given to you by your health care provider. Make sure you discuss any questions you have with your health care provider.  

## 2014-04-27 NOTE — ED Notes (Signed)
Lab at bedside

## 2014-04-27 NOTE — ED Notes (Signed)
Per [facility]. Pt is from nursing facility. Had abnormal hemoglobin(pt states it was 6.3) today, has hx of same. A+Ox 4

## 2014-04-28 NOTE — ED Notes (Signed)
Report given to Nursing facility nurse. [facility].

## 2014-05-21 IMAGING — CR DG CHEST 2V
2 series · 2 of 2 positions shown · non-contrast
Comparison: 04/17/2011

CLINICAL DATA: Mid chest pain.  Shortness of breath this morning.
History of high blood pressure.  History of smoking.

CHEST - 2 VIEW

[w chest pa]
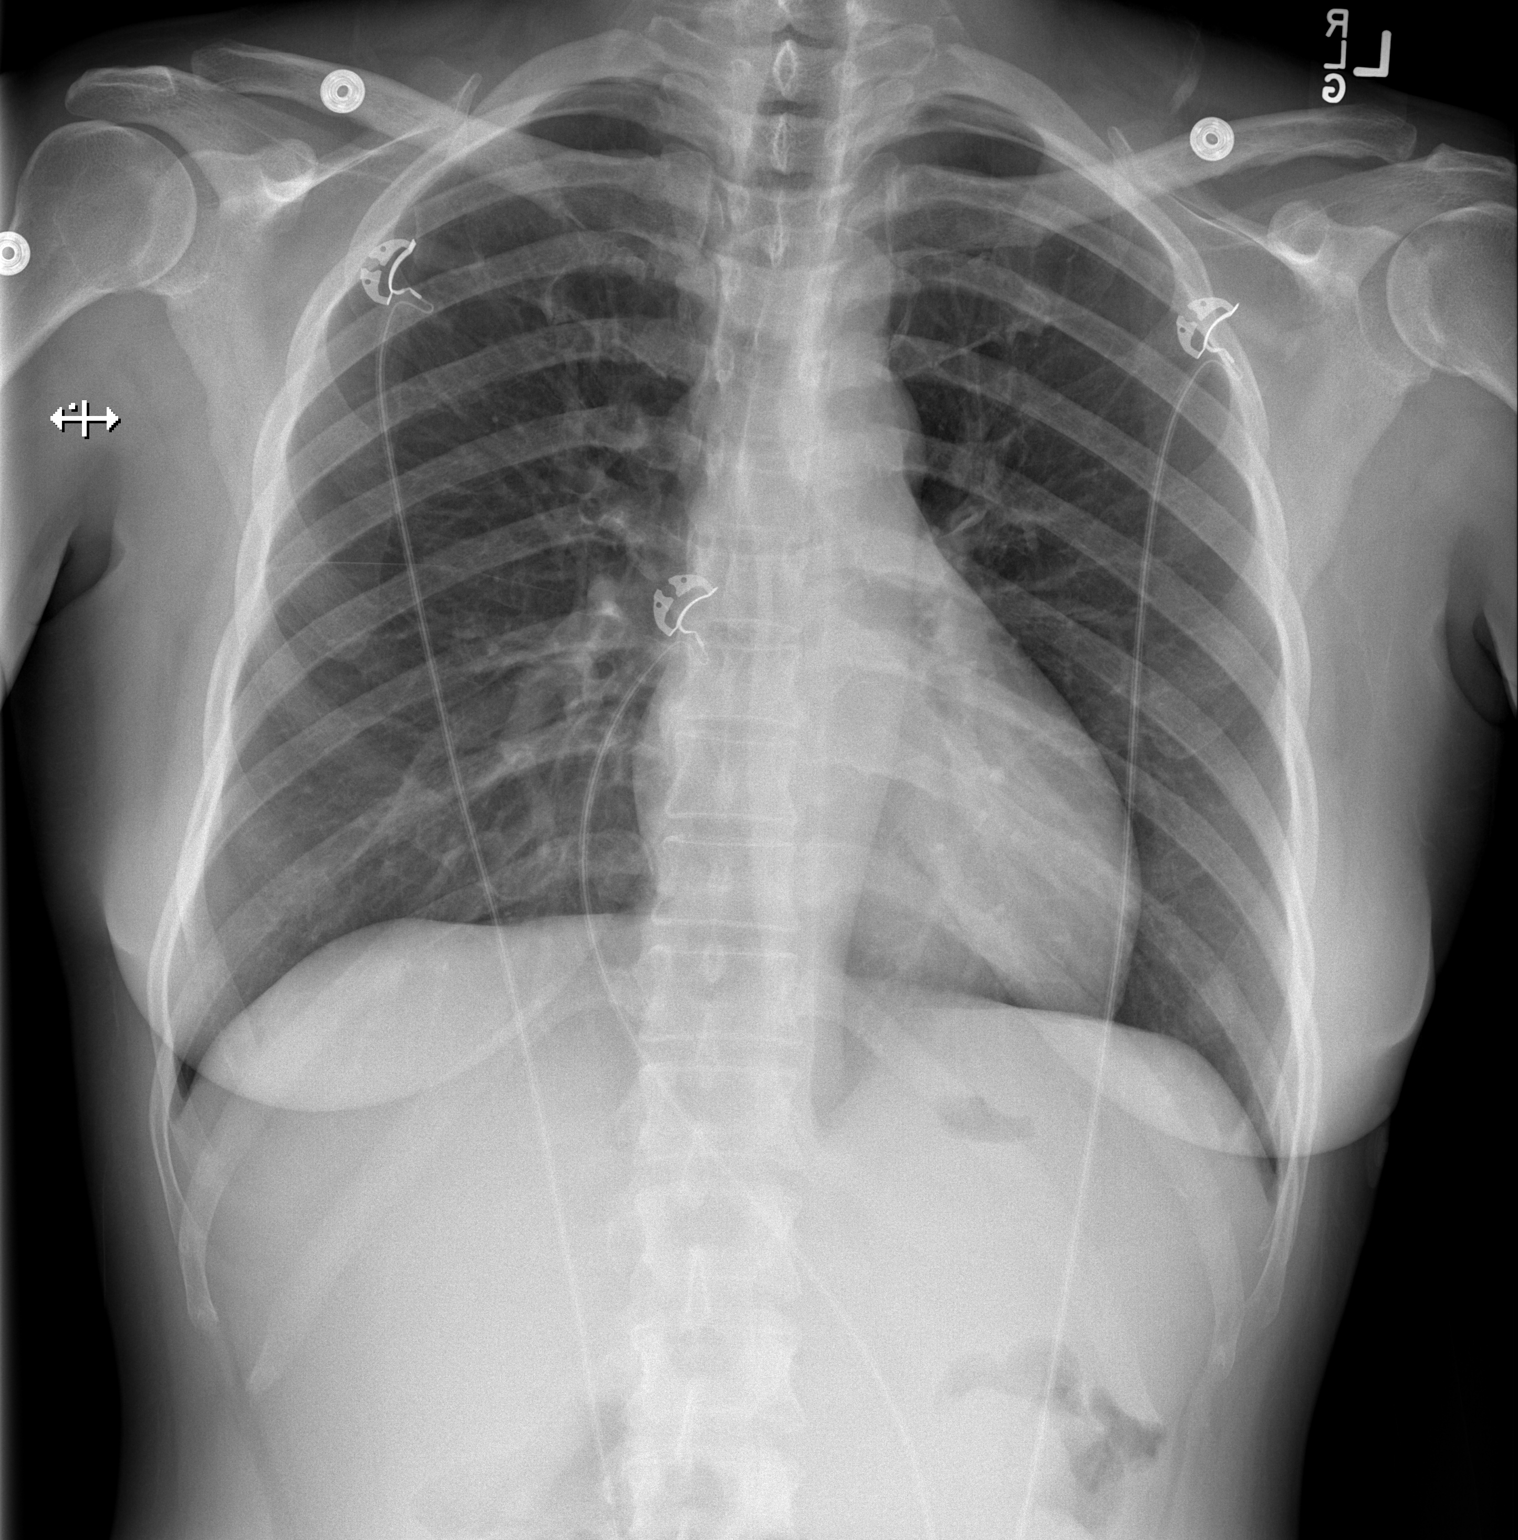

[w chest lat]
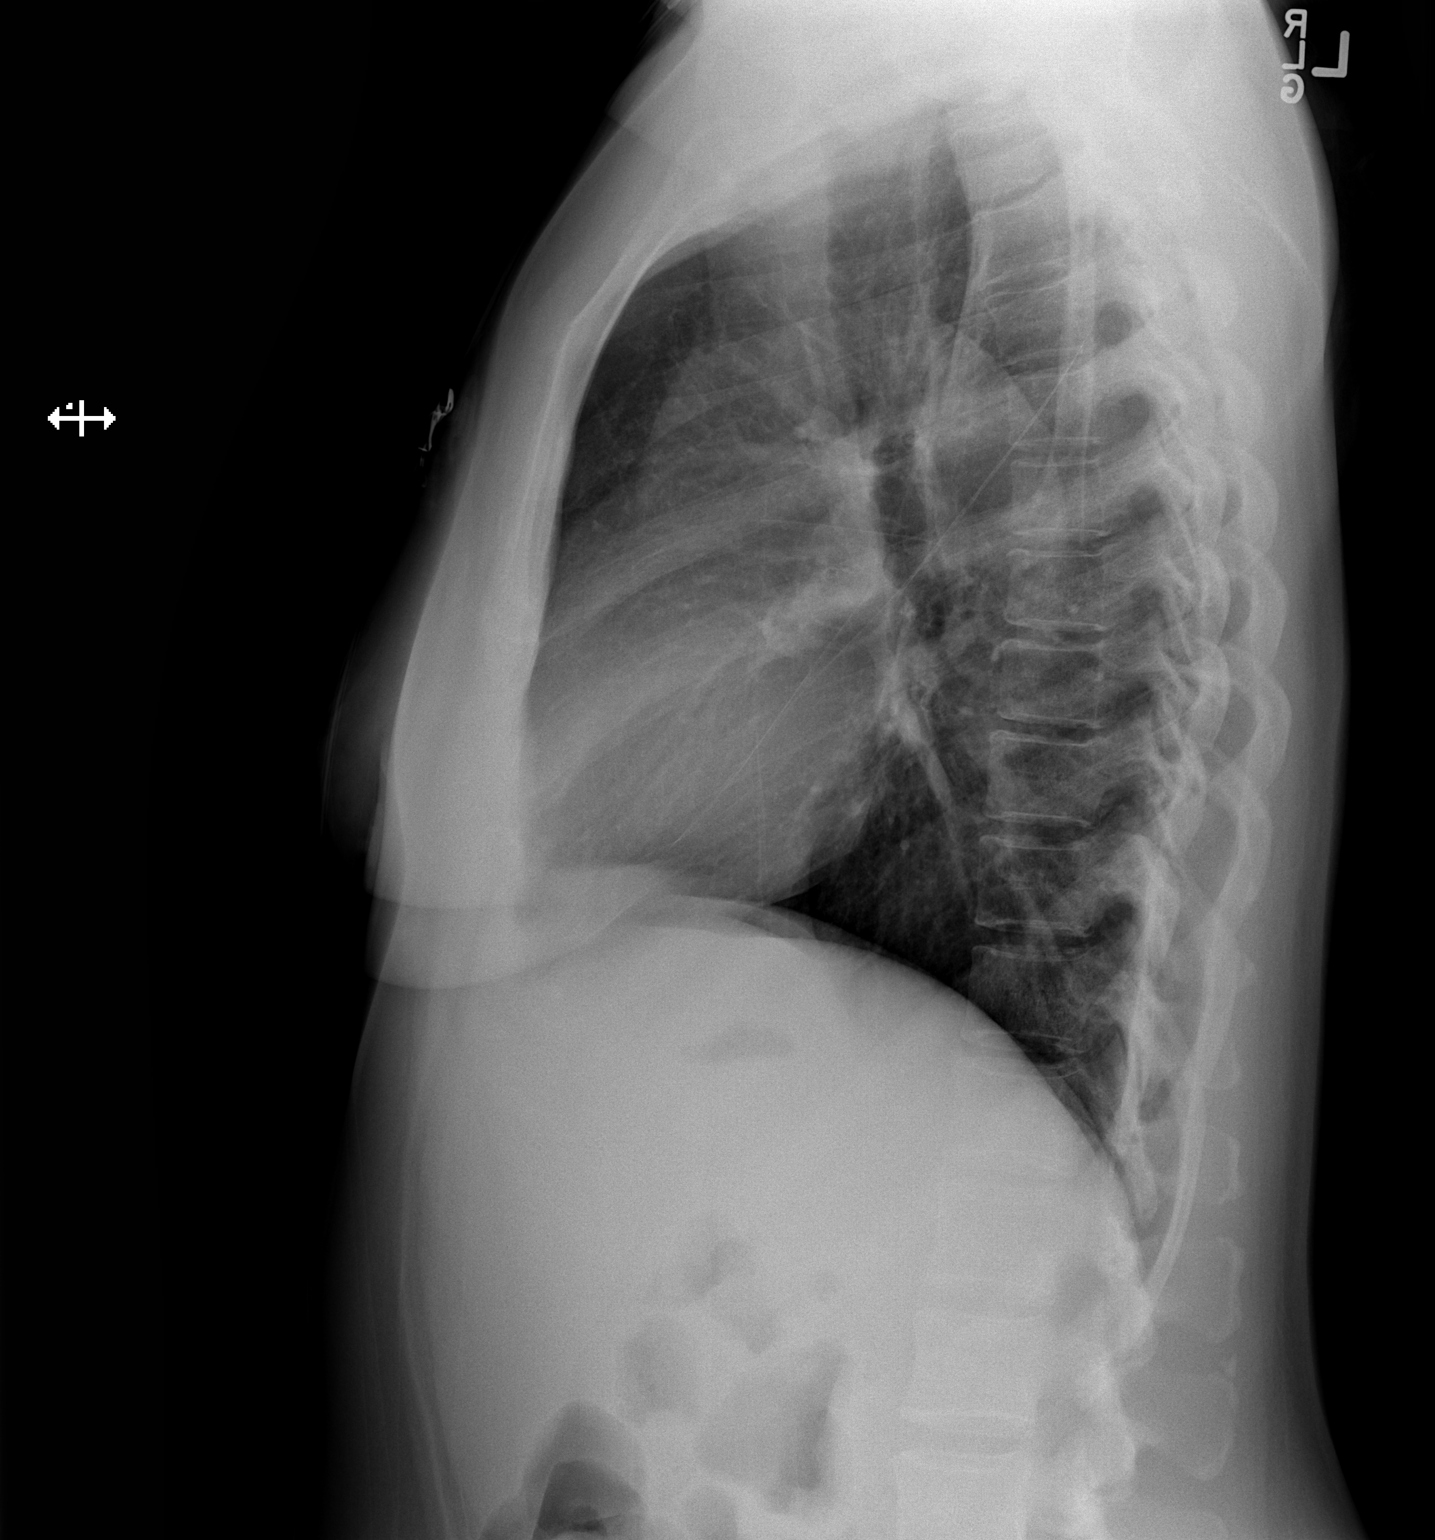

[2 of 2 positions shown; findings below may reference images not displayed]

FINDINGS: Cardiomediastinal silhouette is within normal limits.
The lungs are free of focal consolidations and pleural effusions.
No pulmonary edema. Visualized osseous structures have a normal
appearance.
IMPRESSION: Negative exam.

## 2014-05-21 IMAGING — CT CT HEAD W/O CM
2 series · 16 of 30 positions shown, 20 images · non-contrast
Comparison: 02/13/2008 MRI, 02/12/2008 CT

CLINICAL DATA: Headache, blurred vision

CT HEAD WITHOUT CONTRAST
TECHNIQUE: Contiguous axial images were obtained from the base of
the skull through the vertex without contrast.

[Series 2: head w/o · axial · non-contrast · 0.43mm/px · z∈[+1414,+1534]mm · 13 of 28 slices shown, 17 images]
[im 2/28  brain]
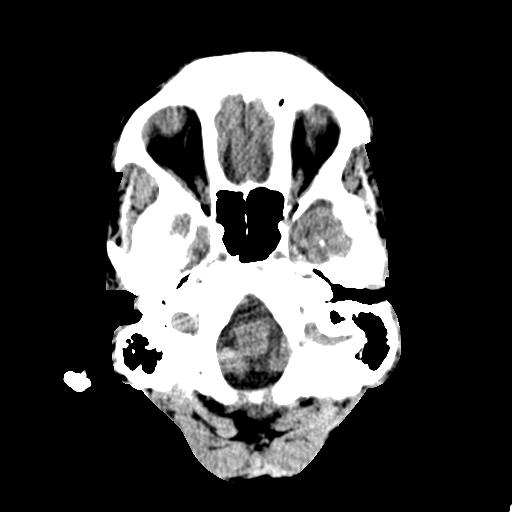
[im 2/28  bone]
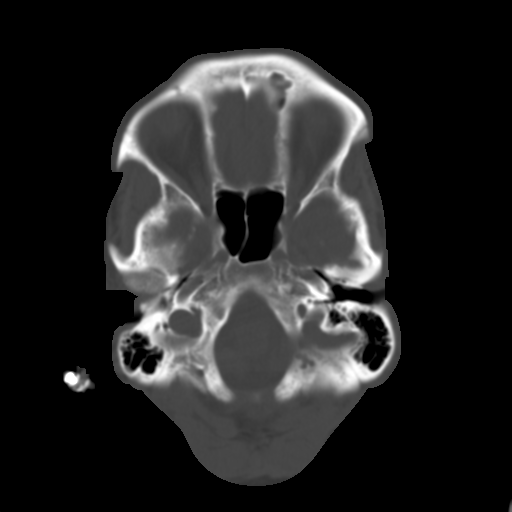
[im 4/28  brain]
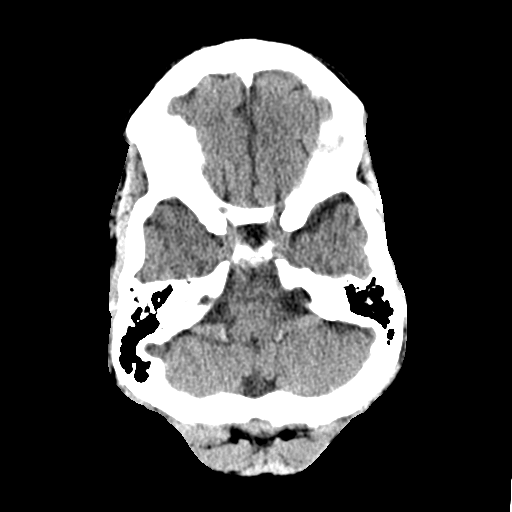
[im 6/28  brain]
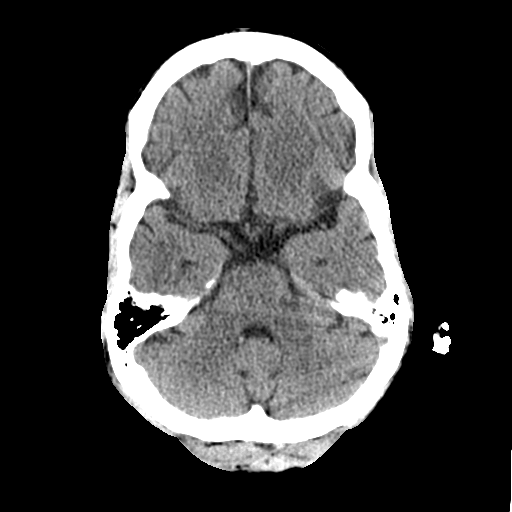
[im 8/28  brain]
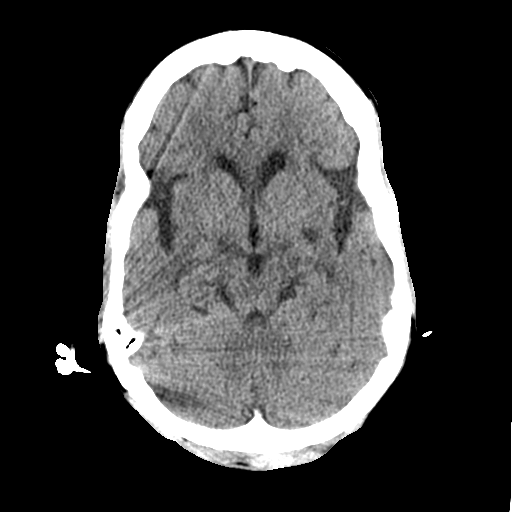
[im 10/28  brain]
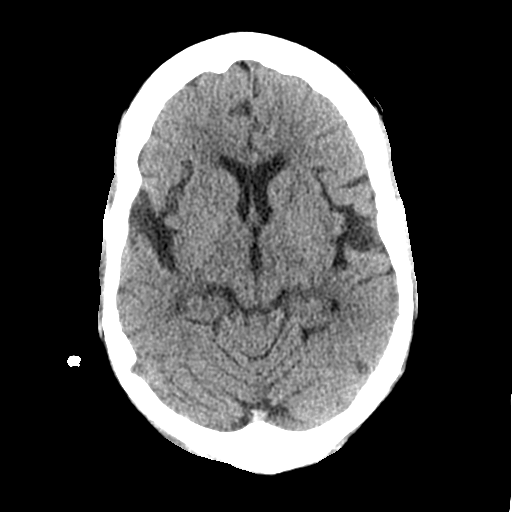
[im 10/28  bone]
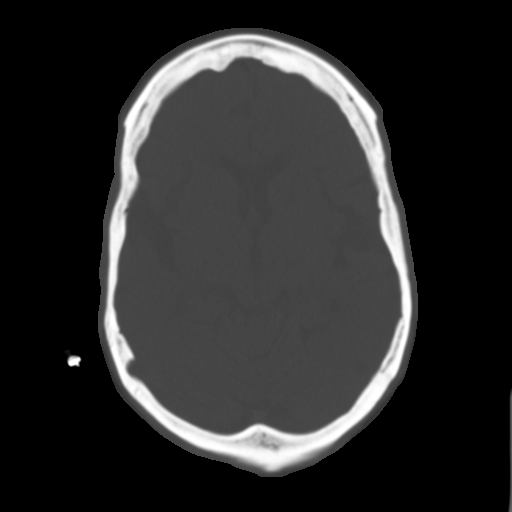
[im 12/28  brain]
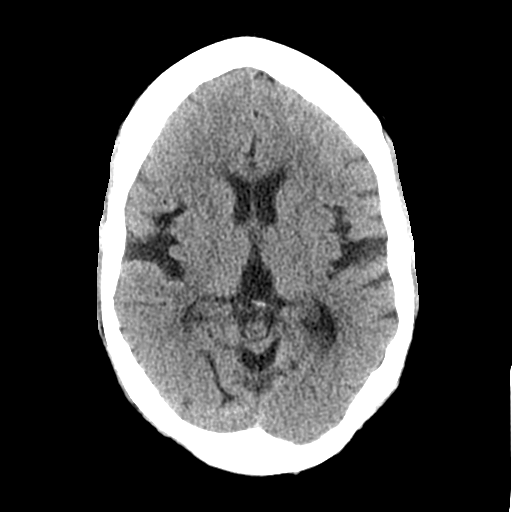
[im 14/28  brain]
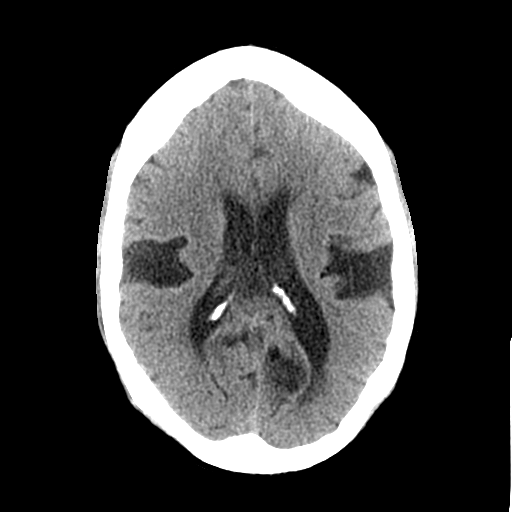
[im 16/28  brain]
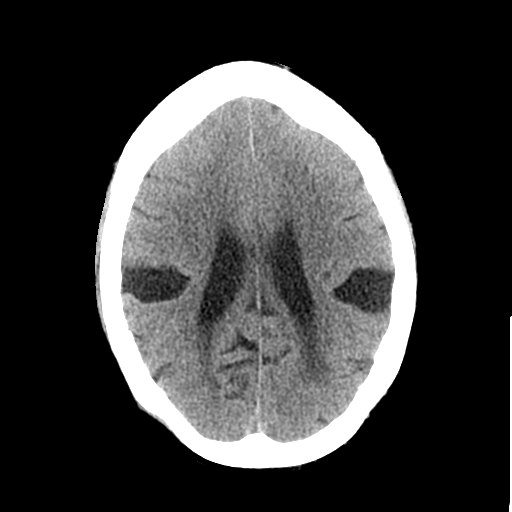
[im 18/28  brain]
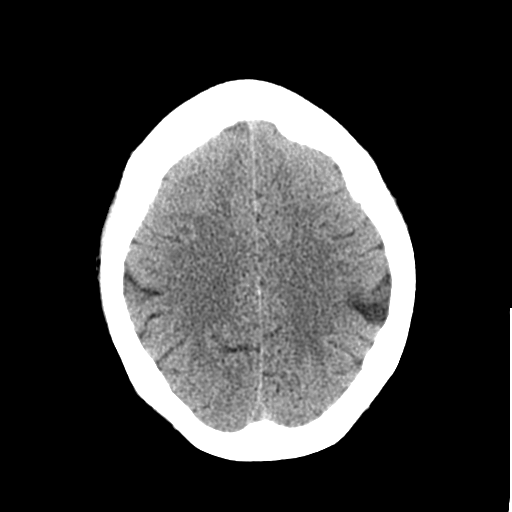
[im 18/28  bone]
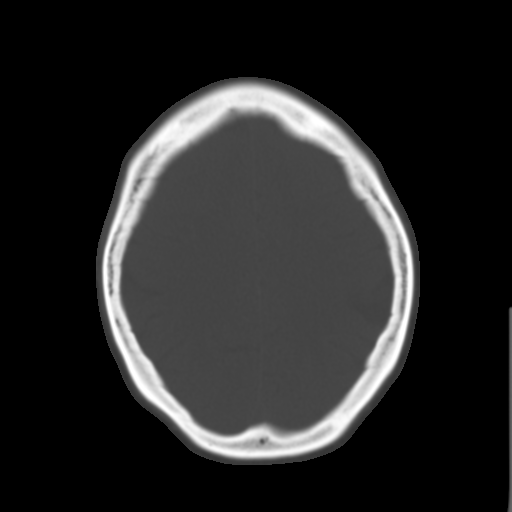
[im 20/28  brain]
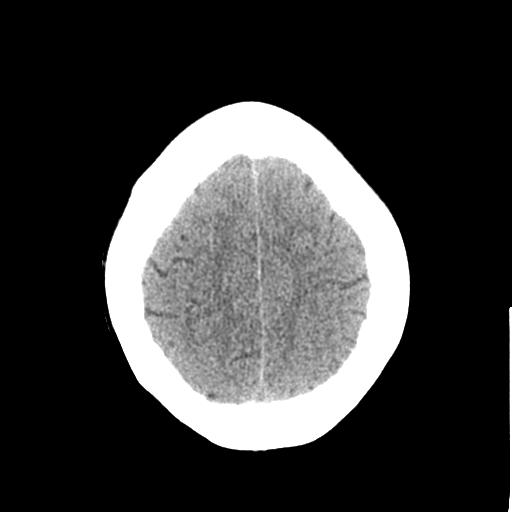
[im 22/28  brain]
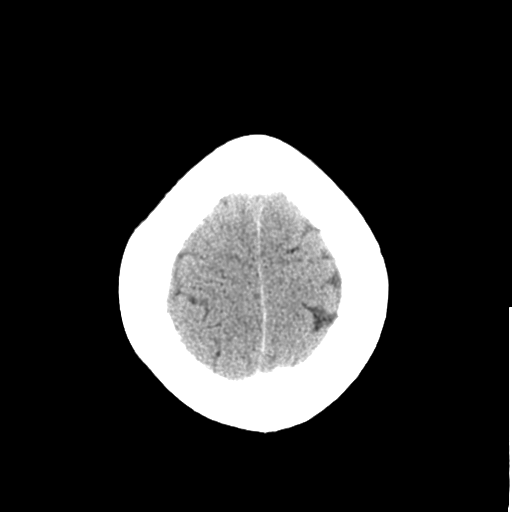
[im 24/28  brain]
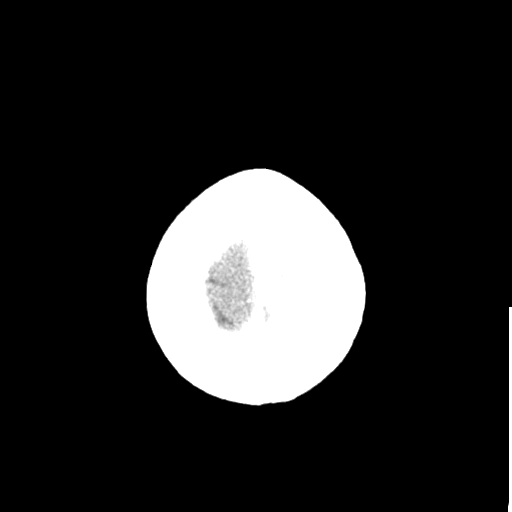
[im 26/28  brain]
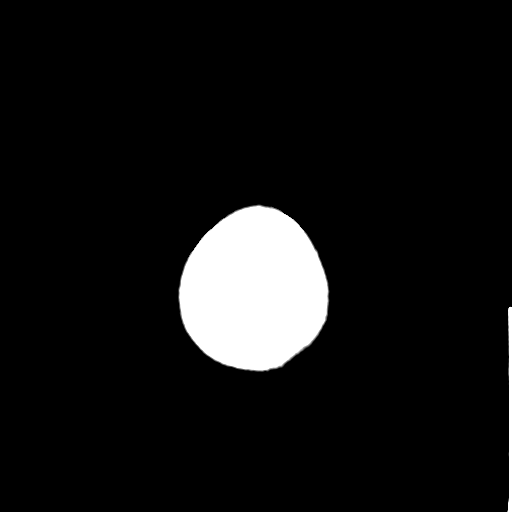
[im 26/28  bone]
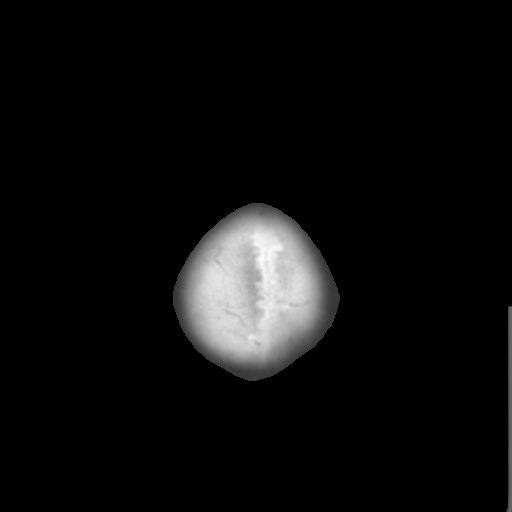

[Series 3: bone windows · axial · 0.43mm/px · z∈[+1414,+1454]mm · 3 of 28 slices shown]
[im 2/28  bone]
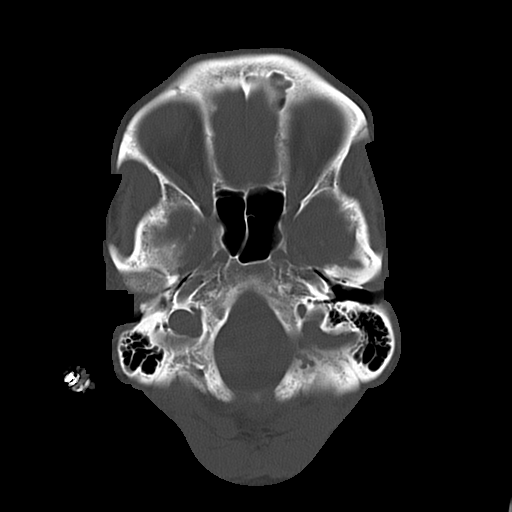
[im 6/28  bone]
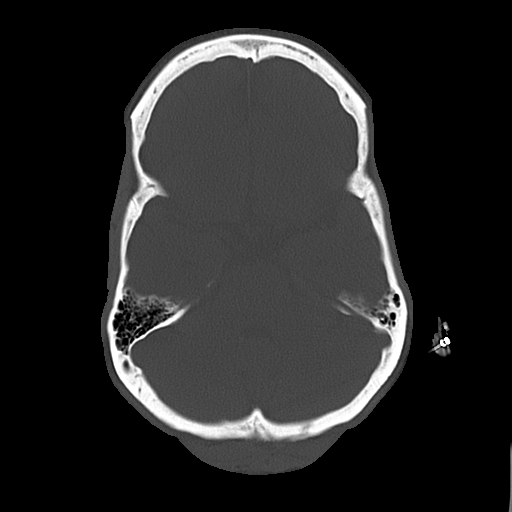
[im 10/28  bone]
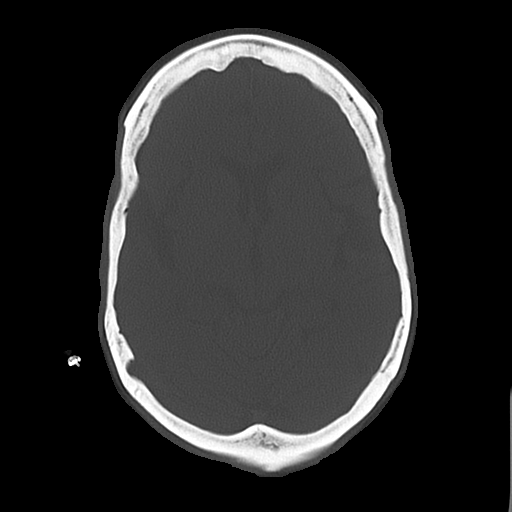

[16 of 30 positions shown; findings below may reference images not displayed]

FINDINGS: There is perisylvian fissure widening, in keeping with an
atypical pattern of atrophy.  No intraparenchymal hemorrhage, mass,
mass effect, or abnormal extra-axial fluid collection. Prominent
left basal ganglia perivascular space is similar to prior.  Mild
periventricular white matter hypodensities.  No CT evidence for
acute infarction.

The visualized paranasal sinuses and mastoid air cells are
predominately clear.
IMPRESSION: [place] atrophy is similar to prior.  No CT evidence for acute
intracranial abnormality.

## 2014-06-22 ENCOUNTER — Encounter (HOSPITAL_COMMUNITY): Payer: Self-pay | Admitting: Nurse Practitioner

## 2014-06-22 ENCOUNTER — Emergency Department (HOSPITAL_COMMUNITY): Payer: Medicare Other

## 2014-06-22 ENCOUNTER — Inpatient Hospital Stay (HOSPITAL_COMMUNITY)
Admission: EM | Admit: 2014-06-22 | Discharge: 2014-06-25 | DRG: 292 | Disposition: A | Discharge status: Home / Self Care | Payer: Medicare Other | Attending: Internal Medicine | Admitting: Internal Medicine

## 2014-06-22 DIAGNOSIS — G8929 Other chronic pain: Secondary | ICD-10-CM | POA: Diagnosis present

## 2014-06-22 DIAGNOSIS — I509 Heart failure, unspecified: Secondary | ICD-10-CM

## 2014-06-22 DIAGNOSIS — Z8614 Personal history of Methicillin resistant Staphylococcus aureus infection: Secondary | ICD-10-CM

## 2014-06-22 DIAGNOSIS — Z89432 Acquired absence of left foot: Secondary | ICD-10-CM

## 2014-06-22 DIAGNOSIS — Y929 Unspecified place or not applicable: Secondary | ICD-10-CM

## 2014-06-22 DIAGNOSIS — S42411A Displaced simple supracondylar fracture without intercondylar fracture of right humerus, initial encounter for closed fracture: Secondary | ICD-10-CM | POA: Diagnosis present

## 2014-06-22 DIAGNOSIS — Z79899 Other long term (current) drug therapy: Secondary | ICD-10-CM

## 2014-06-22 DIAGNOSIS — B182 Chronic viral hepatitis C: Secondary | ICD-10-CM | POA: Diagnosis present

## 2014-06-22 DIAGNOSIS — G40909 Epilepsy, unspecified, not intractable, without status epilepticus: Secondary | ICD-10-CM

## 2014-06-22 DIAGNOSIS — F259 Schizoaffective disorder, unspecified: Secondary | ICD-10-CM | POA: Diagnosis present

## 2014-06-22 DIAGNOSIS — R001 Bradycardia, unspecified: Secondary | ICD-10-CM | POA: Diagnosis present

## 2014-06-22 DIAGNOSIS — R0600 Dyspnea, unspecified: Secondary | ICD-10-CM | POA: Diagnosis not present

## 2014-06-22 DIAGNOSIS — I5033 Acute on chronic diastolic (congestive) heart failure: Secondary | ICD-10-CM | POA: Diagnosis present

## 2014-06-22 DIAGNOSIS — N184 Chronic kidney disease, stage 4 (severe): Secondary | ICD-10-CM

## 2014-06-22 DIAGNOSIS — IMO0001 Reserved for inherently not codable concepts without codable children: Secondary | ICD-10-CM

## 2014-06-22 DIAGNOSIS — Z794 Long term (current) use of insulin: Secondary | ICD-10-CM | POA: Diagnosis not present

## 2014-06-22 DIAGNOSIS — E1140 Type 2 diabetes mellitus with diabetic neuropathy, unspecified: Secondary | ICD-10-CM | POA: Diagnosis present

## 2014-06-22 DIAGNOSIS — E11649 Type 2 diabetes mellitus with hypoglycemia without coma: Secondary | ICD-10-CM | POA: Diagnosis present

## 2014-06-22 DIAGNOSIS — D509 Iron deficiency anemia, unspecified: Secondary | ICD-10-CM | POA: Diagnosis present

## 2014-06-22 DIAGNOSIS — I129 Hypertensive chronic kidney disease with stage 1 through stage 4 chronic kidney disease, or unspecified chronic kidney disease: Secondary | ICD-10-CM | POA: Diagnosis present

## 2014-06-22 DIAGNOSIS — H409 Unspecified glaucoma: Secondary | ICD-10-CM | POA: Diagnosis present

## 2014-06-22 DIAGNOSIS — Z8673 Personal history of transient ischemic attack (TIA), and cerebral infarction without residual deficits: Secondary | ICD-10-CM | POA: Diagnosis not present

## 2014-06-22 DIAGNOSIS — I5031 Acute diastolic (congestive) heart failure: Secondary | ICD-10-CM | POA: Diagnosis present

## 2014-06-22 DIAGNOSIS — Z888 Allergy status to other drugs, medicaments and biological substances status: Secondary | ICD-10-CM | POA: Diagnosis not present

## 2014-06-22 DIAGNOSIS — F17210 Nicotine dependence, cigarettes, uncomplicated: Secondary | ICD-10-CM | POA: Diagnosis present

## 2014-06-22 DIAGNOSIS — Z531 Procedure and treatment not carried out because of patient's decision for reasons of belief and group pressure: Secondary | ICD-10-CM | POA: Diagnosis present

## 2014-06-22 DIAGNOSIS — I10 Essential (primary) hypertension: Secondary | ICD-10-CM

## 2014-06-22 DIAGNOSIS — Z880 Allergy status to penicillin: Secondary | ICD-10-CM | POA: Diagnosis not present

## 2014-06-22 DIAGNOSIS — F319 Bipolar disorder, unspecified: Secondary | ICD-10-CM | POA: Diagnosis present

## 2014-06-22 DIAGNOSIS — Z90710 Acquired absence of both cervix and uterus: Secondary | ICD-10-CM

## 2014-06-22 DIAGNOSIS — X58XXXA Exposure to other specified factors, initial encounter: Secondary | ICD-10-CM | POA: Diagnosis present

## 2014-06-22 DIAGNOSIS — F1593 Other stimulant use, unspecified with withdrawal: Secondary | ICD-10-CM | POA: Diagnosis present

## 2014-06-22 DIAGNOSIS — Z9119 Patient's noncompliance with other medical treatment and regimen: Secondary | ICD-10-CM | POA: Diagnosis present

## 2014-06-22 DIAGNOSIS — Z9114 Patient's other noncompliance with medication regimen: Secondary | ICD-10-CM | POA: Diagnosis present

## 2014-06-22 DIAGNOSIS — N185 Chronic kidney disease, stage 5: Secondary | ICD-10-CM | POA: Diagnosis present

## 2014-06-22 DIAGNOSIS — E1122 Type 2 diabetes mellitus with diabetic chronic kidney disease: Secondary | ICD-10-CM | POA: Diagnosis present

## 2014-06-22 HISTORY — DX: Unspecified viral hepatitis C without hepatic coma: B19.20

## 2014-06-22 HISTORY — DX: Chronic pain syndrome: G89.4

## 2014-06-22 HISTORY — DX: Pure hypercholesterolemia, unspecified: E78.00

## 2014-06-22 HISTORY — DX: Type 2 diabetes mellitus without complications: E11.9

## 2014-06-22 LAB — CBC WITH DIFFERENTIAL/PLATELET
BASOS PCT: 1 % (ref 0–1)
Basophils Absolute: 0 10*3/uL (ref 0.0–0.1)
EOS ABS: 0.2 10*3/uL (ref 0.0–0.7)
Eosinophils Relative: 5 % (ref 0–5)
HEMATOCRIT: 27.9 % — AB (ref 36.0–46.0)
Hemoglobin: 9 g/dL — ABNORMAL LOW (ref 12.0–15.0)
Lymphocytes Relative: 40 % (ref 12–46)
Lymphs Abs: 1.7 10*3/uL (ref 0.7–4.0)
MCH: 27.4 pg (ref 26.0–34.0)
MCHC: 32.3 g/dL (ref 30.0–36.0)
MCV: 84.8 fL (ref 78.0–100.0)
MONO ABS: 0.3 10*3/uL (ref 0.1–1.0)
Monocytes Relative: 6 % (ref 3–12)
Neutro Abs: 2.1 10*3/uL (ref 1.7–7.7)
Neutrophils Relative %: 48 % (ref 43–77)
Platelets: 272 10*3/uL (ref 150–400)
RBC: 3.29 MIL/uL — ABNORMAL LOW (ref 3.87–5.11)
RDW: 15.6 % — AB (ref 11.5–15.5)
WBC: 4.3 10*3/uL (ref 4.0–10.5)

## 2014-06-22 LAB — PRO B NATRIURETIC PEPTIDE: Pro B Natriuretic peptide (BNP): 28725 pg/mL — ABNORMAL HIGH (ref 0–125)

## 2014-06-22 LAB — COMPREHENSIVE METABOLIC PANEL
ALT: 20 U/L (ref 0–35)
ANION GAP: 12 (ref 5–15)
AST: 26 U/L (ref 0–37)
Albumin: 1.6 g/dL — ABNORMAL LOW (ref 3.5–5.2)
Alkaline Phosphatase: 81 U/L (ref 39–117)
BUN: 23 mg/dL (ref 6–23)
CHLORIDE: 107 meq/L (ref 96–112)
CO2: 18 meq/L — AB (ref 19–32)
CREATININE: 3.01 mg/dL — AB (ref 0.50–1.10)
Calcium: 8 mg/dL — ABNORMAL LOW (ref 8.4–10.5)
GFR calc non Af Amer: 17 mL/min — ABNORMAL LOW (ref >90)
GFR, EST AFRICAN AMERICAN: 20 mL/min — AB (ref >90)
GLUCOSE: 127 mg/dL — AB (ref 70–99)
Potassium: 4 mEq/L (ref 3.7–5.3)
Sodium: 137 mEq/L (ref 137–147)
Total Protein: 6.1 g/dL (ref 6.0–8.3)

## 2014-06-22 LAB — CREATININE, SERUM
Creatinine, Ser: 3.11 mg/dL — ABNORMAL HIGH (ref 0.50–1.10)
GFR calc Af Amer: 19 mL/min — ABNORMAL LOW (ref >90)
GFR calc non Af Amer: 17 mL/min — ABNORMAL LOW (ref >90)

## 2014-06-22 LAB — TROPONIN I

## 2014-06-22 LAB — CBC
HCT: 29.4 % — ABNORMAL LOW (ref 36.0–46.0)
Hemoglobin: 9.4 g/dL — ABNORMAL LOW (ref 12.0–15.0)
MCH: 27.1 pg (ref 26.0–34.0)
MCHC: 32 g/dL (ref 30.0–36.0)
MCV: 84.7 fL (ref 78.0–100.0)
PLATELETS: 258 10*3/uL (ref 150–400)
RBC: 3.47 MIL/uL — AB (ref 3.87–5.11)
RDW: 15.7 % — ABNORMAL HIGH (ref 11.5–15.5)
WBC: 4.4 10*3/uL (ref 4.0–10.5)

## 2014-06-22 LAB — CBG MONITORING, ED: Glucose-Capillary: 122 mg/dL — ABNORMAL HIGH (ref 70–99)

## 2014-06-22 LAB — MRSA PCR SCREENING: MRSA BY PCR: NEGATIVE

## 2014-06-22 LAB — GLUCOSE, CAPILLARY: Glucose-Capillary: 151 mg/dL — ABNORMAL HIGH (ref 70–99)

## 2014-06-22 LAB — I-STAT TROPONIN, ED: Troponin i, poc: 0.02 ng/mL (ref 0.00–0.08)

## 2014-06-22 MED ORDER — FOLIC ACID 0.5 MG HALF TAB
0.5000 mg | ORAL_TABLET | Freq: Every day | ORAL | Status: DC
  Administered 2014-06-23 – 2014-06-24 (×2): 0.5 mg via ORAL
  Filled 2014-06-22 (×3): qty 1

## 2014-06-22 MED ORDER — SODIUM CHLORIDE 0.9 % IJ SOLN
3.0000 mL | Freq: Two times a day (BID) | INTRAMUSCULAR | Status: DC
  Administered 2014-06-22 – 2014-06-24 (×5): 3 mL via INTRAVENOUS

## 2014-06-22 MED ORDER — ASPIRIN EC 81 MG PO TBEC
81.0000 mg | DELAYED_RELEASE_TABLET | Freq: Every day | ORAL | Status: DC
  Administered 2014-06-22 – 2014-06-24 (×3): 81 mg via ORAL
  Filled 2014-06-22 (×4): qty 1

## 2014-06-22 MED ORDER — CLONIDINE HCL 0.1 MG PO TABS
0.1000 mg | ORAL_TABLET | Freq: Every day | ORAL | Status: DC
  Administered 2014-06-22 – 2014-06-24 (×3): 0.1 mg via ORAL
  Filled 2014-06-22 (×3): qty 1

## 2014-06-22 MED ORDER — FERROUS SULFATE 325 (65 FE) MG PO TABS
325.0000 mg | ORAL_TABLET | Freq: Three times a day (TID) | ORAL | Status: DC
  Administered 2014-06-23 – 2014-06-25 (×7): 325 mg via ORAL
  Filled 2014-06-22 (×10): qty 1

## 2014-06-22 MED ORDER — SODIUM CHLORIDE 0.9 % IJ SOLN: 3.0000 mL | INTRAMUSCULAR | Status: DC | PRN

## 2014-06-22 MED ORDER — ACETAMINOPHEN 325 MG PO TABS: 650.0000 mg | ORAL_TABLET | ORAL | Status: DC | PRN

## 2014-06-22 MED ORDER — POTASSIUM CHLORIDE CRYS ER 20 MEQ PO TBCR
20.0000 meq | EXTENDED_RELEASE_TABLET | Freq: Every day | ORAL | Status: DC
  Administered 2014-06-23 – 2014-06-24 (×2): 20 meq via ORAL
  Filled 2014-06-22 (×3): qty 1

## 2014-06-22 MED ORDER — ONDANSETRON HCL 4 MG/2ML IJ SOLN: 4.0000 mg | Freq: Four times a day (QID) | INTRAMUSCULAR | Status: DC | PRN

## 2014-06-22 MED ORDER — POLYETHYLENE GLYCOL 3350 17 G PO PACK
17.0000 g | PACK | Freq: Two times a day (BID) | ORAL | Status: DC
  Administered 2014-06-22 – 2014-06-24 (×5): 17 g via ORAL
  Filled 2014-06-22 (×8): qty 1

## 2014-06-22 MED ORDER — GUAIFENESIN-DM 100-10 MG/5ML PO SYRP
5.0000 mL | ORAL_SOLUTION | ORAL | Status: DC | PRN
  Administered 2014-06-22 – 2014-06-24 (×4): 5 mL via ORAL
  Filled 2014-06-22 (×4): qty 5

## 2014-06-22 MED ORDER — ALBUTEROL SULFATE (2.5 MG/3ML) 0.083% IN NEBU
2.5000 mg | INHALATION_SOLUTION | RESPIRATORY_TRACT | Status: DC | PRN
  Administered 2014-06-24 (×2): 2.5 mg via RESPIRATORY_TRACT
  Filled 2014-06-22 (×2): qty 3

## 2014-06-22 MED ORDER — INSULIN ASPART 100 UNIT/ML ~~LOC~~ SOLN: 0.0000 [IU] | Freq: Three times a day (TID) | SUBCUTANEOUS | Status: DC

## 2014-06-22 MED ORDER — FOLIC ACID 400 MCG PO TABS: 400.0000 ug | ORAL_TABLET | Freq: Every morning | ORAL | Status: DC

## 2014-06-22 MED ORDER — INSULIN GLARGINE 100 UNIT/ML ~~LOC~~ SOLN
45.0000 [IU] | Freq: Every day | SUBCUTANEOUS | Status: DC
  Administered 2014-06-22 – 2014-06-23 (×2): 45 [IU] via SUBCUTANEOUS
  Filled 2014-06-22 (×3): qty 0.45

## 2014-06-22 MED ORDER — HYDRALAZINE HCL 20 MG/ML IJ SOLN: 10.0000 mg | Freq: Four times a day (QID) | INTRAMUSCULAR | Status: DC | PRN

## 2014-06-22 MED ORDER — ENOXAPARIN SODIUM 30 MG/0.3ML ~~LOC~~ SOLN
30.0000 mg | SUBCUTANEOUS | Status: DC
  Administered 2014-06-22 – 2014-06-24 (×3): 30 mg via SUBCUTANEOUS
  Filled 2014-06-22 (×5): qty 0.3

## 2014-06-22 MED ORDER — PREGABALIN 25 MG PO CAPS
75.0000 mg | ORAL_CAPSULE | Freq: Two times a day (BID) | ORAL | Status: DC
  Filled 2014-06-22 (×3): qty 3

## 2014-06-22 MED ORDER — FUROSEMIDE 10 MG/ML IJ SOLN
60.0000 mg | Freq: Two times a day (BID) | INTRAMUSCULAR | Status: DC
  Administered 2014-06-22 – 2014-06-24 (×4): 60 mg via INTRAVENOUS
  Filled 2014-06-22 (×5): qty 6

## 2014-06-22 MED ORDER — FUROSEMIDE 10 MG/ML IJ SOLN
40.0000 mg | Freq: Once | INTRAMUSCULAR | Status: AC
  Administered 2014-06-22: 40 mg via INTRAVENOUS
  Filled 2014-06-22: qty 4

## 2014-06-22 MED ORDER — SODIUM CHLORIDE 0.9 % IV SOLN: 250.0000 mL | INTRAVENOUS | Status: DC | PRN

## 2014-06-22 MED ORDER — CARVEDILOL 25 MG PO TABS
25.0000 mg | ORAL_TABLET | Freq: Two times a day (BID) | ORAL | Status: DC
  Administered 2014-06-22 – 2014-06-24 (×5): 25 mg via ORAL
  Filled 2014-06-22 (×8): qty 1

## 2014-06-22 MED ORDER — SIMETHICONE 80 MG PO CHEW
80.0000 mg | CHEWABLE_TABLET | Freq: Three times a day (TID) | ORAL | Status: DC | PRN
  Filled 2014-06-22: qty 1

## 2014-06-22 MED ORDER — DULOXETINE HCL 60 MG PO CPEP
60.0000 mg | ORAL_CAPSULE | Freq: Every day | ORAL | Status: DC
  Administered 2014-06-22 – 2014-06-24 (×3): 60 mg via ORAL
  Filled 2014-06-22 (×3): qty 1

## 2014-06-22 MED ORDER — OXYCODONE-ACETAMINOPHEN 5-325 MG PO TABS
1.0000 | ORAL_TABLET | ORAL | Status: DC | PRN
Start: 2014-06-22 — End: 2014-06-25
  Administered 2014-06-23 – 2014-06-25 (×5): 2 via ORAL
  Filled 2014-06-22 (×5): qty 2

## 2014-06-22 MED ORDER — LEVETIRACETAM 500 MG PO TABS
500.0000 mg | ORAL_TABLET | Freq: Two times a day (BID) | ORAL | Status: DC
  Administered 2014-06-22 – 2014-06-24 (×5): 500 mg via ORAL
  Filled 2014-06-22 (×8): qty 1

## 2014-06-22 MED ORDER — HYDRALAZINE HCL 20 MG/ML IJ SOLN
10.0000 mg | Freq: Once | INTRAMUSCULAR | Status: AC
  Administered 2014-06-22: 10 mg via INTRAVENOUS
  Filled 2014-06-22: qty 1

## 2014-06-22 NOTE — ED Notes (Signed)
Admitting MD at bedside.

## 2014-06-22 NOTE — ED Notes (Signed)
Attempted report 

## 2014-06-22 NOTE — ED Provider Notes (Signed)
CSN: [id number]     Arrival date & time 06/22/14  1502 History   First MD Initiated Contact with Patient 06/22/14 1512     Chief Complaint  Patient presents with  . Shortness of Breath   Patient is a female with history of CHF, stage IIIc KD, hepatitis C, and a long history of medication noncompliance who presents complaining of shortness of breath today. She says that for the past 4 days she has been having worsening lower extremity edema, and has become unable to lie flat without developing significant shortness of breath. She says today she began experiencing some chest tightness while trying to lie down. This resolved spontaneously. She says that she has not been taking all of her medications for the past week or so because she has run out of some of them, though she is unsure which ones. She denies any shortness of breath while sitting upright, and is not complaining of any chest pain or chest tightness currently.  (Consider location/radiation/quality/duration/timing/severity/associated sxs/prior Treatment) HPI  Past Medical History  Diagnosis Date  . Neuropathy   . Glaucoma   . Bipolar 1 disorder   . CHF (congestive heart failure)   . Refusal of blood transfusions as patient is Jehovah's Witness   . Hypertension   . Shortness of breath   . Diabetes mellitus     INSULIN DEPENDENT  . TIA (transient ischemic attack)   . Neuromuscular disorder     DIABETIC NEUROPATHY  . Chronic kidney disease   . Hepatitis C virus   . Seizure     diagnosed 2014  . MRSA infection   . Bipolar disorder, unspecified 02/06/2014  . Schizoaffective disorder, unspecified condition 02/08/2014   Past Surgical History  Procedure Laterality Date  . Abdominal hysterectomy    . Amputation Left 05/21/2013    Procedure: Left Midfoot AMPUTATION;  Surgeon: Newt Minion, MD;  Location: [facility];  Service: Orthopedics;  Laterality: Left;  Left Midfoot Amputation  . Tubal ligation    . Colonoscopy with propofol N/A  06/22/2013    Procedure: COLONOSCOPY WITH PROPOFOL;  Surgeon: Jeryl Columbia, MD;  Location: [facility];  Service: Endoscopy;  Laterality: N/A;   Family History  Problem Relation Age of Onset  . Hypertension Mother   . Hypertension Father    History  Substance Use Topics  . Smoking status: Current Every Day Smoker -- 0.25 packs/day for 30 years    Types: Cigarettes  . Smokeless tobacco: Never Used  . Alcohol Use: Yes     Comment: @ 1/week   OB History    No data available     Review of Systems  Constitutional: Positive for chills. Negative for fever.  Respiratory: Positive for cough, chest tightness and shortness of breath.   Cardiovascular: Positive for leg swelling. Negative for palpitations.  Gastrointestinal: Positive for nausea, vomiting and constipation.  Genitourinary: Negative for dysuria and urgency.  All other systems reviewed and are negative.     Allergies  Gabapentin; Saphris; Tramadol; and Penicillins  Home Medications   Prior to Admission medications   Medication Sig Start Date End Date Taking? Authorizing Provider  carvedilol (COREG) 25 MG tablet Take 1 tablet (25 mg total) by mouth 2 (two) times daily. 04/14/14   Nishant Dhungel, MD  cloNIDine (CATAPRES) 0.1 MG tablet Take 0.1 mg by mouth daily.    Historical Provider, MD  cloNIDine (CATAPRES) 0.1 MG tablet Take 0.1 mg by mouth 3 (three) times daily as needed (for hypertension).  Historical Provider, MD  DULoxetine (CYMBALTA) 60 MG capsule Take 60 mg by mouth daily.    Historical Provider, MD  ferrous sulfate 325 (65 FE) MG tablet Take 1 tablet (325 mg total) by mouth 3 (three) times daily with meals. 04/14/14   Nishant Dhungel, MD  folic acid (FOLVITE) A999333 MCG tablet Take 400 mcg by mouth every morning.    Historical Provider, MD  furosemide (LASIX) 20 MG tablet Take 3 tablets (60 mg total) by mouth every morning. 04/25/14   Theodis Blaze, MD  insulin aspart (NOVOLOG) 100 UNIT/ML injection Inject 2-10  Units into the skin 3 (three) times daily before meals. SSI: 0-200 = 2 units, 201-250 = 4 units, 251-300 = 6 units, 301-350 = 8 units, 351-400 = 10 units    Historical Provider, MD  insulin glargine (LANTUS) 100 UNIT/ML injection Inject 45 Units into the skin at bedtime.     Historical Provider, MD  levETIRAcetam (KEPPRA) 500 MG tablet Take 500 mg by mouth 2 (two) times daily.    Historical Provider, MD  metroNIDAZOLE (FLAGYL) 500 MG tablet Take 1 tablet (500 mg total) by mouth every 12 (twelve) hours. 04/25/14   Theodis Blaze, MD  Multiple Vitamins-Minerals (SENIOR MULTIVITAMIN PLUS PO) Take 1 tablet by mouth every morning.    Historical Provider, MD  oxyCODONE-acetaminophen (PERCOCET/ROXICET) 5-325 MG per tablet Take 1-2 tablets by mouth every 4 (four) hours as needed for moderate pain. 04/25/14   Theodis Blaze, MD  polyethylene glycol (MIRALAX / Floria Raveling) packet Take 17 g by mouth 2 (two) times daily.    Historical Provider, MD  pregabalin (LYRICA) 75 MG capsule Take 75 mg by mouth 2 (two) times daily.    Historical Provider, MD  silver sulfADIAZINE (SILVADENE) 1 % cream Apply topically daily. 04/14/14   Nishant Dhungel, MD  simethicone (MYLICON) 80 MG chewable tablet Chew 80 mg by mouth every 8 (eight) hours as needed for flatulence.    Historical Provider, MD   BP 195/118 mmHg  Pulse 96  Temp(Src) 97.7 F (36.5 C) (Oral)  Resp 20  Ht 5\' 5"  (1.651 m)  Wt 196 lb (88.905 kg)  BMI 32.62 kg/m2  SpO2 100% Physical Exam  Constitutional: She is oriented to person, place, and time. She appears well-developed. No distress.  HENT:  Head: Normocephalic and atraumatic.  Eyes: Conjunctivae are normal. Pupils are equal, round, and reactive to light.  Neck: No JVD present.  Cardiovascular: Normal rate, regular rhythm and intact distal pulses.  Exam reveals no gallop.   Pulmonary/Chest: No respiratory distress. She has decreased breath sounds. She has no wheezes. She has no rales.  Abdominal: She  exhibits distension. She exhibits no fluid wave. There is no tenderness.  Musculoskeletal: She exhibits edema (3+  to the knees bilaterally).  Amputation of the left forefoot. No skin breakdown or signs of cellulitis.  Neurological: She is alert and oriented to person, place, and time.  Skin: Skin is warm and dry.  Nursing note and vitals reviewed.   ED Course  Procedures (including critical care time) Labs Review Labs Reviewed  CBG MONITORING, ED - Abnormal; Notable for the following:    Glucose-Capillary 122 (*)    All other components within normal limits  CBC WITH DIFFERENTIAL  PRO B NATRIURETIC PEPTIDE  COMPREHENSIVE METABOLIC PANEL  I-STAT TROPOININ, ED    Imaging Review No results found.   EKG Interpretation   Date/Time:  Wednesday June 22 2014 15:07:23 EST Ventricular Rate:  97 PR  Interval:  158 QRS Duration: 86 QT Interval:  371 QTC Calculation: 471 R Axis:   89 Text Interpretation:  Sinus rhythm Abnormal T, consider ischemia, diffuse  leads (new) Baseline wander in lead(s) II III aVR aVL aVF V4 V6 Abnormal  ekg Confirmed by BEATON  MD, ROBERT ([id number]) on 06/22/2014 3:12:15 PM      MDM   Final diagnoses:  None   Patient presents complaining of shortness of breath and orthopnea in the context of recent medication noncompliance. She has also been having some chest tightness, but no active chest pain on her arrival here. EKG shows apparently new lateral T-wave inversions, that had not been present on any previous EKGs here. Concern for CHF exacerbation in the context of possible medication noncompliance versus cardiac ischemia. Will check basic labs, get troponin chest x-ray and BNP.   Chest x-ray shows cardiomegaly and pulmonary edema. First troponin negative. Patient remains hypertensive. Will treat blood pressure with hydralazine, give first dose of Lasix 40 mg here.  Patient maintains 100% oxygen saturation on room air.  Will admit for likely acute  exacerbation of CHF.  Leata Mouse, MD 06/23/14 0113  Dot Lanes, MD 06/30/14 [id number]  Dot Lanes, MD 06/30/14 [phone]

## 2014-06-22 NOTE — ED Notes (Signed)
Per [facility] pt from home c/o shortness of breath and cough x4 days. Pt unable to tolerate laying flat and has had increasing facial swelling. SpO2 98% per [facility]

## 2014-06-22 NOTE — Consult Note (Signed)
Name: [name] is a 48 y.o. female Admit date: 06/22/2014 Referring Physician: [facility]  Primary Physician:  Maurice Small, M.D. Primary Cardiologist:  none  Reason for Consultation:  Diastolic heart failure due to medication noncompliance  ASSESSMENT: 1. Acute on chronic diastolic heart failure, with exacerbation related to medication discontinuation 2. Stage IV chronic kidney disease 3. Bipolar disorder illness schizoaffective disorder 4. Severe hypertension, essential 5. Hepatitis C 6. Anemia 7. Diabetes mellitus, type II 8. Seizure disorder  PLAN:  1. Resume antihypertensive therapy(carvedilol, clonidine) and chronic diuretic therapy(80 mg orally daily) 2. IV Lasix 80 mg every 12 hours 3. Monitor renal function 4. Once blood pressure is controlled and the patient is adequately diuresed, may be discharged. 5. Key component to this hospitalization is getting the patient back on her medications and making sure that she is compliant. 6. Given EKG changes, serial cardiac markers should be done to exclude the remote possibility of myocardial ischemia/injury  HPI: 48 year old female with a history of stage IV chronic kidney disease, severe hypertension, chronic diastolic heart failure, seizure disorder, type 2 diabetes, and psychiatric illness. She comes the emergency room because orthopnea. She also most lower extremity swelling. She has been off of her antihypertensive and diuretic therapy (not sure exactly which of her medications) for the past 3-4 days. She was hoping that she can make it until next Tuesday when she had a doctor's appointment. She denies chest pain.  PMH:   Past Medical History  Diagnosis Date  . Neuropathy   . Glaucoma   . Bipolar 1 disorder   . CHF (congestive heart failure)   . Refusal of blood transfusions as patient is Jehovah's Witness   . Hypertension   . Shortness of breath   . Diabetes mellitus     INSULIN DEPENDENT  .  TIA (transient ischemic attack)   . Neuromuscular disorder     DIABETIC NEUROPATHY  . Chronic kidney disease   . Hepatitis C virus   . Seizure     diagnosed 2014  . MRSA infection   . Bipolar disorder, unspecified 02/06/2014  . Schizoaffective disorder, unspecified condition 02/08/2014    PSH:   Past Surgical History  Procedure Laterality Date  . Abdominal hysterectomy    . Amputation Left 05/21/2013    Procedure: Left Midfoot AMPUTATION;  Surgeon: Newt Minion, MD;  Location: [place];  Service: Orthopedics;  Laterality: Left;  Left Midfoot Amputation  . Tubal ligation    . Colonoscopy with propofol N/A 06/22/2013    Procedure: COLONOSCOPY WITH PROPOFOL;  Surgeon: Jeryl Columbia, MD;  Location: [facility];  Service: Endoscopy;  Laterality: N/A;   Allergies:  Gabapentin; Saphris; Tramadol; and Penicillins Prior to Admit Meds:   (Not in a hospital admission) Fam HX:    Family History  Problem Relation Age of Onset  . Hypertension Mother   . Hypertension Father    Social HX:    History   Social History  . Marital Status: Single    Spouse Name: N/A    Number of Children: N/A  . Years of Education: N/A   Occupational History  . Not on file.   Social History Main Topics  . Smoking status: Current Every Day Smoker -- 0.25 packs/day for 30 years    Types: Cigarettes  . Smokeless tobacco: Never Used  . Alcohol Use: Yes     Comment: @ 1/week  . Drug Use: No  . Sexual Activity: No  Other Topics Concern  . Not on file   Social History Narrative     Review of Systems: Lower extremity swelling, left foot partial amputation, noncompliance with medical regimen, noncompliance with dietary salt restriction, denies headache, transient neurological symptoms, syncope, palpitations, PND, and fevers/chills.  Physical Exam: Blood pressure 188/96, pulse 96, temperature 97.7 F (36.5 C), temperature source Oral, resp. rate 19, height 5\' 5"  (1.651 m), weight 198 lb 5 oz (89.954 kg),  SpO2 100 %. Weight change:    Middle aged African-American female who is in no acute distress, sitting at 80 in the hospital gurney Skin is clear HEENT exam without jaundice or pallor Chest is clear to auscultation and percussion Cardiac exam reveals an S4/S3 gallop. No murmurs heard. Abdomen is soft. No bruits are heard. No tenderness is present. Extremities reveal no edema. Pulses are 2+ and symmetric in the upper and lower extremities. The left foot is partially amputated.. The neurological exam demonstrates decreased memory, confusion about medications, some difficulty with communication, no motor or sensory deficits, and no cranial nerve deficits.  Labs: Lab Results  Component Value Date   WBC 4.3 06/22/2014   HGB 9.0* 06/22/2014   HCT 27.9* 06/22/2014   MCV 84.8 06/22/2014   PLT 272 06/22/2014    Recent Labs Lab 06/22/14 1522  NA 137  K 4.0  CL 107  CO2 18*  BUN 23  CREATININE 3.01*  CALCIUM 8.0*  PROT 6.1  BILITOT <0.2*  ALKPHOS 81  ALT 20  AST 26  GLUCOSE 127*   No results found for: PTT Lab Results  Component Value Date   INR 1.06 04/12/2014   Lab Results  Component Value Date   CKTOTAL 271* 02/13/2008   CKMB 2.5 02/13/2008   TROPONINI <0.30 04/11/2014     Lab Results  Component Value Date   CHOL  02/12/2008    136        ATP III CLASSIFICATION:  <200     mg/dL   Desirable  200-239  mg/dL   Borderline High  >=240    mg/dL   High   Lab Results  Component Value Date   HDL 76 02/12/2008   Lab Results  Component Value Date   LDLCALC  02/12/2008    49        Total Cholesterol/HDL:CHD Risk Coronary Heart Disease Risk Table                     Men   Women  1/2 Average Risk   3.4   3.3   Lab Results  Component Value Date   TRIG 54 02/12/2008   Lab Results  Component Value Date   CHOLHDL 1.8 02/12/2008   No results found for: LDLDIRECT    Radiology:  Dg Chest 2 View  06/22/2014   CLINICAL DATA:  Shortness of breath, migraine for 4  days, no bowel movement  EXAM: CHEST  2 VIEW  COMPARISON:  04/27/2014  FINDINGS: There is bilateral diffuse interstitial thickening. There is left lower lobe airspace disease. There is no significant pleural effusion or pneumothorax. There is stable cardiomegaly. The osseous structures are unremarkable.  IMPRESSION: Bilateral diffuse interstitial thickening and cardiomegaly most concerning for mild pulmonary edema. There is hazy left lower lobe airspace disease which may reflect atelectasis versus developing pneumonia.   Electronically Signed   By: Kathreen Devoid   On: 06/22/2014 16:05    EKG:  Normal sinus rhythm, rightward axis, prominent voltage compatible with left  ventricular hypertrophy, and suspected secondary precordial T-wave abnormality. T-wave abnormality is new when compared to prior tracings.    Sinclair Grooms 06/22/2014 5:14 PM

## 2014-06-22 NOTE — H&P (Addendum)
PATIENT DETAILS Name: [name] Age: 48 y.o. Sex: female Date of Birth: May 30, 1966 Admit Date: 06/22/2014 [id number], CAROL, D, MD   CHIEF COMPLAINT:  Orthopnea, exertional dyspnea, worsening lower extremity edema for the past 4 days  HPI: [name] is a 48 y.o. female with a Past Medical History of chronic diastolic heart failure, stage IV chronic kidney disease, hypertension, hepatitis C, type 2 diabetes who presents today with the above noted complaint. Per patient, she ran out of her Lasix, clonidine approximately 7-10 days back. Approximately 4 days back, patient started developing shortness of breath mostly on exertion. She also started developing orthopnea, and now has required approximately 3 pillows to sleep. She also noted worsening lower extremity edema. She presented to the ED with these complaints, and was found to have acute diastolic heart failure, uncontrolled hypertension. I was asked to admit this patient for further evaluation and treatment. She denies any fever, dysuria. She complains of some chest discomfort-which she describes as heaviness. No nausea, vomiting, diarrhea or dysuria.   ALLERGIES:   Allergies  Allergen Reactions  . Gabapentin Itching and Swelling  . Saphris [Asenapine] Swelling    Swelling of the face  . Tramadol Swelling    Causes legs to swell  . Penicillins Hives    Has tolerated Cefepime and Ceftriaxone.    PAST MEDICAL HISTORY: Past Medical History  Diagnosis Date  . Neuropathy   . Glaucoma   . Bipolar 1 disorder   . CHF (congestive heart failure)   . Refusal of blood transfusions as patient is Jehovah's Witness   . Hypertension   . Shortness of breath   . Diabetes mellitus     INSULIN DEPENDENT  . TIA (transient ischemic attack)   . Neuromuscular disorder     DIABETIC NEUROPATHY  . Chronic kidney disease   . Hepatitis C virus   . Seizure     diagnosed 2014  . MRSA infection   . Bipolar disorder, unspecified  02/06/2014  . Schizoaffective disorder, unspecified condition 02/08/2014    PAST SURGICAL HISTORY: Past Surgical History  Procedure Laterality Date  . Abdominal hysterectomy    . Amputation Left 05/21/2013    Procedure: Left Midfoot AMPUTATION;  Surgeon: Newt Minion, MD;  Location: [facility];  Service: Orthopedics;  Laterality: Left;  Left Midfoot Amputation  . Tubal ligation    . Colonoscopy with propofol N/A 06/22/2013    Procedure: COLONOSCOPY WITH PROPOFOL;  Surgeon: Jeryl Columbia, MD;  Location: [facility];  Service: Endoscopy;  Laterality: N/A;    MEDICATIONS AT HOME: Prior to Admission medications   Medication Sig Start Date End Date Taking? Authorizing Provider  carvedilol (COREG) 25 MG tablet Take 1 tablet (25 mg total) by mouth 2 (two) times daily. 04/14/14   Nishant Dhungel, MD  cloNIDine (CATAPRES) 0.1 MG tablet Take 0.1 mg by mouth daily.    Historical Provider, MD  cloNIDine (CATAPRES) 0.1 MG tablet Take 0.1 mg by mouth 3 (three) times daily as needed (for hypertension).    Historical Provider, MD  DULoxetine (CYMBALTA) 60 MG capsule Take 60 mg by mouth daily.    Historical Provider, MD  ferrous sulfate 325 (65 FE) MG tablet Take 1 tablet (325 mg total) by mouth 3 (three) times daily with meals. 04/14/14   Nishant Dhungel, MD  folic acid (FOLVITE) A999333 MCG tablet Take 400 mcg by mouth every morning.    Historical Provider, MD  furosemide (LASIX) 20  MG tablet Take 3 tablets (60 mg total) by mouth every morning. 04/25/14   Theodis Blaze, MD  insulin aspart (NOVOLOG) 100 UNIT/ML injection Inject 2-10 Units into the skin 3 (three) times daily before meals. SSI: 0-200 = 2 units, 201-250 = 4 units, 251-300 = 6 units, 301-350 = 8 units, 351-400 = 10 units    Historical Provider, MD  insulin glargine (LANTUS) 100 UNIT/ML injection Inject 45 Units into the skin at bedtime.     Historical Provider, MD  levETIRAcetam (KEPPRA) 500 MG tablet Take 500 mg by mouth 2 (two) times daily.    Historical  Provider, MD  metroNIDAZOLE (FLAGYL) 500 MG tablet Take 1 tablet (500 mg total) by mouth every 12 (twelve) hours. 04/25/14   Theodis Blaze, MD  Multiple Vitamins-Minerals (SENIOR MULTIVITAMIN PLUS PO) Take 1 tablet by mouth every morning.    Historical Provider, MD  oxyCODONE-acetaminophen (PERCOCET/ROXICET) 5-325 MG per tablet Take 1-2 tablets by mouth every 4 (four) hours as needed for moderate pain. 04/25/14   Theodis Blaze, MD  polyethylene glycol (MIRALAX / Floria Raveling) packet Take 17 g by mouth 2 (two) times daily.    Historical Provider, MD  pregabalin (LYRICA) 75 MG capsule Take 75 mg by mouth 2 (two) times daily.    Historical Provider, MD  silver sulfADIAZINE (SILVADENE) 1 % cream Apply topically daily. 04/14/14   Nishant Dhungel, MD  simethicone (MYLICON) 80 MG chewable tablet Chew 80 mg by mouth every 8 (eight) hours as needed for flatulence.    Historical Provider, MD    FAMILY HISTORY: Family History  Problem Relation Age of Onset  . Hypertension Mother   . Hypertension Father     SOCIAL HISTORY:  reports that she has been smoking Cigarettes.  She has a 7.5 pack-year smoking history. She has never used smokeless tobacco. She reports that she drinks alcohol. She reports that she does not use illicit drugs.  REVIEW OF SYSTEMS:  Constitutional:   No  weight loss, night sweats,  Fevers, chills, fatigue.  HEENT:    No headaches, Difficulty swallowing,Tooth/dental problems,Sore throat  Cardio-vascular: No   dizziness, palpitations  GI:  No heartburn, indigestion, abdominal pain, nausea, vomiting, diarrhea, change in  bowel habits, loss of appetite  Resp: No shortness of breath with exertion or at rest.  No excess mucus, no productive cough, No non-productive cough,  No coughing up of blood.  Skin:  no rash or lesions.  GU:  no dysuria, change in color of urine, no urgency or frequency.  No flank pain.  Musculoskeletal: No joint pain or swelling.  No decreased range of  motion.  No back pain.  Psych: No change in mood or affect. No depression or anxiety.  No memory loss.   PHYSICAL EXAM: Blood pressure 172/89, pulse 96, temperature 97.7 F (36.5 C), temperature source Oral, resp. rate 17, height 5\' 5"  (1.651 m), weight 89.954 kg (198 lb 5 oz), SpO2 100 %.  General appearance :Awake, alert, not in any distress. Speech Clear. Not toxic Looking HEENT: Atraumatic and Normocephalic, pupils equally reactive to light and accomodation Neck: supple, no JVD. No cervical lymphadenopathy.  Chest:Good air entry bilaterally, bibasilar rales CVS: S1 S2 regular, no murmurs.  Abdomen: Bowel sounds present, Non tender and not distended with no gaurding, rigidity or rebound. Extremities: B/L Lower Ext shows 2-3 + edema, both legs are warm to touch Neurology: Awake alert, and oriented X 3, CN II-XII intact, Non focal Skin:No Rash Wounds:N/A  LABS  ON ADMISSION:   Recent Labs  06/22/14 1522  NA 137  K 4.0  CL 107  CO2 18*  GLUCOSE 127*  BUN 23  CREATININE 3.01*  CALCIUM 8.0*    Recent Labs  06/22/14 1522  AST 26  ALT 20  ALKPHOS 81  BILITOT <0.2*  PROT 6.1  ALBUMIN 1.6*   No results for input(s): LIPASE, AMYLASE in the last 72 hours.  Recent Labs  06/22/14 1522  WBC 4.3  NEUTROABS 2.1  HGB 9.0*  HCT 27.9*  MCV 84.8  PLT 272   No results for input(s): CKTOTAL, CKMB, CKMBINDEX, TROPONINI in the last 72 hours. No results for input(s): DDIMER in the last 72 hours. Invalid input(s): POCBNP   RADIOLOGIC STUDIES ON ADMISSION: Dg Chest 2 View  06/22/2014   CLINICAL DATA:  Shortness of breath, migraine for 4 days, no bowel movement  EXAM: CHEST  2 VIEW  COMPARISON:  04/27/2014  FINDINGS: There is bilateral diffuse interstitial thickening. There is left lower lobe airspace disease. There is no significant pleural effusion or pneumothorax. There is stable cardiomegaly. The osseous structures are unremarkable.  IMPRESSION: Bilateral diffuse  interstitial thickening and cardiomegaly most concerning for mild pulmonary edema. There is hazy left lower lobe airspace disease which may reflect atelectasis versus developing pneumonia.   Electronically Signed   By: Kathreen Devoid   On: 06/22/2014 16:05     EKG: Independently reviewed. Diffuse T-wave inversions.  ASSESSMENT AND PLAN: Present on Admission:  . Acute diastolic CHF (congestive heart failure): Secondary to noncompliance. Start Lasix 60 mg IV twice a day. Resume Coreg. Daily weights, strict intake output, follow clinical course and adjust diuretic regimen accordingly. Needs counseling/education regarding CHF.  Marland Kitchen Accelerated hypertension: Secondary to noncompliance, clonidine withdrawal. Given IV hydralazine in the emergency room. Will place back on usual dosing of Coreg and clonidine. Will continue to use prn hydralazine.   . New EKG changes with T-wave inversions: Cycle cardiac enzymes. Defer further workup to cardiology.  . Stage 4 chronic kidney disease due to type 2 diabetes mellitus: Creatinine close to usual baseline. Will need outpatient nephrology referral on discharge.   Marland Kitchen History of seizure disorder: Last seizure approximately 2 months back. Continue with Keppra.  . Hep C : Further workup/treatment deferred to the outpatient setting   . DM type 2 causing CKD stage 4: Continue Lantus and a side. Follow CBGs.   Further plan will depend as patient's clinical course evolves and further radiologic and laboratory data become available. Patient will be monitored closely.  Above noted plan was discussed with patient, she was in agreement.   DVT Prophylaxis: Prophylactic Lovenox  Code Status: Full Code  Disposition Plan: Home when stable  Total time spent for admission equals 45 minutes.  [facility] Pager [phone]  If 7PM-7AM, please contact night-coverage [email or web address] Password [email or web address] 06/22/2014, 5:29 PM

## 2014-06-23 DIAGNOSIS — Z531 Procedure and treatment not carried out because of patient's decision for reasons of belief and group pressure: Secondary | ICD-10-CM

## 2014-06-23 LAB — GLUCOSE, CAPILLARY
GLUCOSE-CAPILLARY: 107 mg/dL — AB (ref 70–99)
GLUCOSE-CAPILLARY: 95 mg/dL (ref 70–99)
Glucose-Capillary: 83 mg/dL (ref 70–99)

## 2014-06-23 LAB — BASIC METABOLIC PANEL
Anion gap: 11 (ref 5–15)
BUN: 25 mg/dL — AB (ref 6–23)
CHLORIDE: 108 meq/L (ref 96–112)
CO2: 19 mEq/L (ref 19–32)
Calcium: 7.8 mg/dL — ABNORMAL LOW (ref 8.4–10.5)
Creatinine, Ser: 3.22 mg/dL — ABNORMAL HIGH (ref 0.50–1.10)
GFR calc Af Amer: 18 mL/min — ABNORMAL LOW (ref >90)
GFR, EST NON AFRICAN AMERICAN: 16 mL/min — AB (ref >90)
GLUCOSE: 77 mg/dL (ref 70–99)
POTASSIUM: 4.4 meq/L (ref 3.7–5.3)
Sodium: 138 mEq/L (ref 137–147)

## 2014-06-23 LAB — TROPONIN I: Troponin I: <0.3 ng/mL (ref <0.30)

## 2014-06-23 MED ORDER — FUROSEMIDE 10 MG/ML IJ SOLN
INTRAMUSCULAR | Status: AC
  Administered 2014-06-23: 60 mg via INTRAVENOUS
  Filled 2014-06-23: qty 8

## 2014-06-23 NOTE — Evaluation (Signed)
Physical Therapy Evaluation Patient Details Name: [name] MRN: [id number] DOB: 09-21-1965 Today's Date: 06/23/2014   History of Present Illness  48 y.o. female with a Past Medical History of chronic diastolic heart failure, stage IV chronic kidney disease, hypertension, hepatitis C, type 2 diabetes who presents today with exertional dyspnea and increasing pedal edema over the past few days. CHF exacerbation.  Clinical Impression  Patient demonstrates deficits in functional mobility as indicated below. Will need continued skilled PT to address deficits and maximize function. Will see as indicated and progress as tolerated.  Given patients instability, patient will need cane for mobility and outpatient PT for balance training.    Follow Up Recommendations Outpatient PT    Equipment Recommendations  Cane    Recommendations for Other Services       Precautions / Restrictions Precautions Precautions: Fall      Mobility  Bed Mobility Overal bed mobility: Modified Independent                Transfers Overall transfer level: Needs assistance Equipment used: None Transfers: Sit to/from Stand Sit to Stand: Supervision            Ambulation/Gait Ambulation/Gait assistance: Min guard;Min assist Ambulation Distance (Feet): 210 Feet Assistive device:  (intermittent use of rait in place of cane) Gait Pattern/deviations: Step-through pattern;Decreased stride length;Decreased weight shift to left;Staggering left;Drifts right/left;Narrow base of support Gait velocity: decreased Gait velocity interpretation: Below normal speed for age/gender General Gait Details: instability with ambulation  Stairs            Wheelchair Mobility    Modified Rankin (Stroke Patients Only)       Balance Overall balance assessment: Needs assistance;History of Falls   Sitting balance-Leahy Scale: Good     Standing balance support: During functional activity Standing  balance-Leahy Scale: Fair Standing balance comment: difficulty with corerctions during dynammic balance secondary to hx ft amputation             High level balance activites: Side stepping;Direction changes;Turns;Sudden stops High Level Balance Comments: min assist for stability, increased difficulty with balance checks and corrections Standardized Balance Assessment Standardized Balance Assessment : Berg Balance Test Berg Balance Test Sit to Stand: Needs minimal aid to stand or to stabilize Standing Unsupported: Able to stand 30 seconds unsupported Sitting with Back Unsupported but Feet Supported on Floor or Stool: Able to sit safely and securely 2 minutes Stand to Sit: Controls descent by using hands Transfers: Able to transfer with verbal cueing and /or supervision Standing Unsupported with Eyes Closed: Able to stand 10 seconds with supervision Standing Ubsupported with Feet Together: Needs help to attain position and unable to hold for 15 seconds From Standing, Reach Forward with Outstretched Arm: Reaches forward but needs supervision From Standing Position, Pick up Object from Floor: Unable to try/needs assist to keep balance From Standing Position, Turn to Look Behind Over each Shoulder: Needs supervision when turning Turn 360 Degrees: Needs close supervision or verbal cueing Standing Unsupported, Alternately Place Feet on Step/Stool: Able to complete >2 steps/needs minimal assist Standing Unsupported, One Foot in Front: Loses balance while stepping or standing Standing on One Leg: Unable to try or needs assist to prevent fall Total Score: 19         Pertinent Vitals/Pain Pain Assessment: No/denies pain    Home Living Family/patient expects to be discharged to:: Private residence Living Arrangements: Children   Type of Home: House Home Access: Level entry     Home Layout:  One level Home Equipment: Bedside commode      Prior Function Level of Independence:  Independent               Hand Dominance   Dominant Hand: Left    Extremity/Trunk Assessment   Upper Extremity Assessment:  (reported mal healed fx of RUE)           Lower Extremity Assessment: LLE deficits/detail   LLE Deficits / Details: LLE hx of foot foot amputation     Communication   Communication: No difficulties  Cognition Arousal/Alertness: Awake/alert Behavior During Therapy: WFL for tasks assessed/performed Overall Cognitive Status: Within Functional Limits for tasks assessed                      General Comments      Exercises        Assessment/Plan    PT Assessment Patient needs continued PT services  PT Diagnosis Difficulty walking;Abnormality of gait   PT Problem List Decreased strength;Decreased balance;Decreased mobility;Decreased coordination  PT Treatment Interventions DME instruction;Gait training;Stair training;Functional mobility training;Therapeutic activities;Therapeutic exercise;Balance training;Patient/family education   PT Goals (Current goals can be found in the Care Plan section) Acute Rehab PT Goals Patient Stated Goal: to not fall PT Goal Formulation: With patient Time For Goal Achievement: 07/07/14 Potential to Achieve Goals: Good    Frequency Min 3X/week   Barriers to discharge        Co-evaluation               End of Session Equipment Utilized During Treatment: Gait belt Activity Tolerance: Patient tolerated treatment well Patient left: in bed;with call Somero/phone within reach Nurse Communication: Mobility status         Time: 1203-1221 PT Time Calculation (min) (ACUTE ONLY): 18 min   Charges:   PT Evaluation $Initial PT Evaluation Tier I: 1 Procedure PT Treatments $Gait Training: 8-22 mins   PT G CodesDuncan Dull 06/23/2014, 1:19 PM  Alben Deeds, [place] DPT  [phone]

## 2014-06-23 NOTE — Plan of Care (Signed)
Problem: Phase I Progression Outcomes Goal: Hemodynamically stable Outcome: Progressing No acute events this shift.  BP controlled on PO meds.  Educating patient on HF fluid restrictions.  Will continue to monitor patient condition.

## 2014-06-23 NOTE — Progress Notes (Signed)
[facility] Progress Note   [name] [id number] DOB: 1966-03-01 DOA: 06/22/2014 PCP: Maurice Small, D, MD  Brief narrative: [name] is a 48 y.o. female with a Past Medical History of chronic diastolic heart failure, stage IV chronic kidney disease, hypertension, hepatitis C, type 2 diabetes who presents today with exertional dyspnea and increasing pedal edema over the past few days. She states that she ran out of her medications about 4 days ago which is when the symptoms started.   Subjective: Breathing is much better but does not feel that it is back to baseline.  Assessment/Plan: Principal Problem:   Acute diastolic CHF -Have resumed diuretics and Coreg-continues to have some mild pedal edem and mild crackles in lungs as well-continueto diuresis-cardiology assisting with management  Active Problems:    Accelerated hypertension -Due to not taking antihypertensives and specifically due to clonidine withdrawal -Home medications resumed with improvement in blood pressure    DM type 2 causing CKD stage 4 -Continue home dosages of insulin    Seizure disorder -Continue Keppra    Hep C w/o coma, chronic    Stage 4 chronic kidney disease due to type 2 diabetes mellitus -Stable -Outpatient referral to nephrology   Chronic pain -She states she went to pain management clinic couple of months ago but was not able to make an appointment to return-she has, therefore run out of her narcotics  -she is now asking me for narcotics prescription upon discharge and states that her PCP will not prescribe it for her  Status post transmetatarsal amputation-left foot -Patient states her balance is not stable because of this and is requesting a PT eval which I have ordered  Right supracondylar humeral fracture -Outpatient follow-up appointment with Dr. Sharol Given to this coming Monday-there is malunion of the fracture and there was some discussion of possible surgery to correct  this  Code Status: Full code Disposition Plan: Home in a.m. DVT prophylaxis: Lovenox  Consultants: Cardiology   Antibiotics: Anti-infectives    None         Objective: Filed Weights   06/22/14 1629 06/22/14 1748 06/23/14 0522  Weight: 89.954 kg (198 lb 5 oz) 90 kg (198 lb 6.6 oz) 88.8 kg (195 lb 12.3 oz)    Intake/Output Summary (Last 24 hours) at 06/23/14 1154 Last data filed at 06/23/14 0843  Gross per 24 hour  Intake    963 ml  Output   1850 ml  Net   -887 ml     Vitals Filed Vitals:   06/22/14 2024 06/23/14 0226 06/23/14 0522 06/23/14 0942  BP: 175/92 122/78 122/81 121/71  Pulse: 100 85 73   Temp: 98.3 F (36.8 C) 98.4 F (36.9 C) 97.5 F (36.4 C)   TempSrc: Oral Oral Oral   Resp: 22 20 20    Height:      Weight:   88.8 kg (195 lb 12.3 oz)   SpO2: 100% 95% 100%     Exam: General: No acute respiratory distress Lungs: Mild crackles in bilateral bases Cardiovascular: Regular rate and rhythm without murmur gallop or rub normal S1 and S2 Abdomen: Nontender, nondistended, soft, bowel sounds positive, no rebound, no ascites, no appreciable mass Extremities: No significant cyanosis, clubbing-  edema bilateral lower extremities- left greater than right which she states is chronically swollen after her amputation  Data Reviewed: Basic Metabolic Panel:  Recent Labs Lab 06/22/14 1522 06/22/14 2050 06/23/14 0816  NA 137  --  138  K 4.0  --  4.4  CL 107  --  108  CO2 18*  --  19  GLUCOSE 127*  --  77  BUN 23  --  25*  CREATININE 3.01* 3.11* 3.22*  CALCIUM 8.0*  --  7.8*   Liver Function Tests:  Recent Labs Lab 06/22/14 1522  AST 26  ALT 20  ALKPHOS 81  BILITOT <0.2*  PROT 6.1  ALBUMIN 1.6*   No results for input(s): LIPASE, AMYLASE in the last 168 hours. No results for input(s): AMMONIA in the last 168 hours. CBC:  Recent Labs Lab 06/22/14 1522 06/22/14 2050  WBC 4.3 4.4  NEUTROABS 2.1  --   HGB 9.0* 9.4*  HCT 27.9* 29.4*  MCV 84.8  84.7  PLT 272 258   Cardiac Enzymes:  Recent Labs Lab 06/22/14 2050 06/23/14 0540 06/23/14 0816  TROPONINI <0.30 <0.30 <0.30   BNP (last 3 results)  Recent Labs  02/23/14 0224 04/12/14 0520 06/22/14 1532  PROBNP 2305.0* 12801.0* 28725.0*   CBG:  Recent Labs Lab 06/22/14 1526 06/22/14 2114 06/23/14 0559  GLUCAP 122* 151* 83    Recent Results (from the past 240 hour(s))  MRSA PCR Screening     Status: None   Collection Time: 06/22/14  9:51 PM  Result Value Ref Range Status   MRSA by PCR NEGATIVE NEGATIVE Final    Comment:        The GeneXpert MRSA Assay (FDA approved for NASAL specimens only), is one component of a comprehensive MRSA colonization surveillance program. It is not intended to diagnose MRSA infection nor to guide or monitor treatment for MRSA infections.      Studies:  Recent x-ray studies have been reviewed in detail by the Attending Physician  Scheduled Meds:  Scheduled Meds: . aspirin EC  81 mg Oral Daily  . carvedilol  25 mg Oral BID  . cloNIDine  0.1 mg Oral Daily  . DULoxetine  60 mg Oral Daily  . enoxaparin (LOVENOX) injection  30 mg Subcutaneous Q24H  . ferrous sulfate  325 mg Oral TID WC  . folic acid  0.5 mg Oral Daily  . furosemide  60 mg Intravenous Q12H  . insulin aspart  0-9 Units Subcutaneous TID WC  . insulin glargine  45 Units Subcutaneous QHS  . levETIRAcetam  500 mg Oral BID  . polyethylene glycol  17 g Oral BID  . potassium chloride  20 mEq Oral Daily  . pregabalin  75 mg Oral BID  . sodium chloride  3 mL Intravenous Q12H   Continuous Infusions:   Time spent on care of this patient: 35 minutes   DISH, MD 06/23/2014, 11:54 AM  LOS: 1 day   [facility] Office  [phone] Pager - Text Page per [email or web address]  If 7PM-7AM, please contact night-coverage [email or web address]

## 2014-06-23 NOTE — Plan of Care (Signed)
Problem: Phase I Progression Outcomes Goal: Dyspnea controlled at rest (HF) Outcome: Completed/Met Date Met:  06/23/14

## 2014-06-23 NOTE — Progress Notes (Signed)
Pt HR 40's informed by central monitor.  BP 145/88.  Pt stated "I threw up a little bit, from undigested food sitting up".  Declined nausea med.  BP145/88, P 77.  Dr. Wynelle Cleveland informed.  Instructed  To continue to monitor.  Karie Kirks, Therapist, sports.

## 2014-06-23 NOTE — Progress Notes (Signed)
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 [name]  Patient Profile: 48 yo female w/ hx D-CHF, CKD IV, HTN, Hep C, DM, sz, bipolar, was admitted 11/18 w/ volume overload (no Lasix in at least 1 month).  SUBJECTIVE: She is breathing much better today, although legs are still edematous. She does not have scales at home.  OBJECTIVE Filed Vitals:   06/22/14 1748 06/22/14 2024 06/23/14 0226 06/23/14 0522  BP: 183/104 175/92 122/78 122/81  Pulse: 106 100 85 73  Temp: 97.5 F (36.4 C) 98.3 F (36.8 C) 98.4 F (36.9 C) 97.5 F (36.4 C)  TempSrc: Oral Oral Oral Oral  Resp: 18 22 20 20   Height: 5\' 4"  (1.626 m)     Weight: 198 lb 6.6 oz (90 kg)   195 lb 12.3 oz (88.8 kg)  SpO2: 100% 100% 95% 100%    Intake/Output Summary (Last 24 hours) at 06/23/14 0918 Last data filed at 06/23/14 0843  Gross per 24 hour  Intake    963 ml  Output   1850 ml  Net   -887 ml   Filed Weights   06/22/14 1629 06/22/14 1748 06/23/14 0522  Weight: 198 lb 5 oz (89.954 kg) 198 lb 6.6 oz (90 kg) 195 lb 12.3 oz (88.8 kg)    PHYSICAL EXAM General: Well developed, well nourished, female in no acute distress. Head: Normocephalic, atraumatic.  Neck: Supple without bruits, JVD 8-9 centimeters. Lungs:  Resp regular and unlabored, Rales bases. Heart: RRR, S1, S2, ? S3, no S4, or murmur; no rub. Abdomen: Soft, non-tender, non-distended, BS + x 4.  Extremities: No clubbing, cyanosis, 1-2 + edema.  Neuro: Alert and oriented X 3. Moves all extremities spontaneously. Psych: Normal affect.  LABS: CBC:  Recent Labs  06/22/14 1522 06/22/14 2050  WBC 4.3 4.4  NEUTROABS 2.1  --   HGB 9.0* 9.4*  HCT 27.9* 29.4*  MCV 84.8 84.7  PLT 272 0000000   Basic Metabolic Panel:  Recent Labs  06/22/14 1522 06/22/14 2050  NA 137  --   K 4.0  --   CL 107  --   CO2 18*  --   GLUCOSE 127*  --   BUN 23  --   CREATININE 3.01* 3.11*  CALCIUM 8.0*  --    Liver Function Tests:  Recent Labs  06/22/14 1522  AST 26  ALT 20  ALKPHOS 81  BILITOT <0.2*  PROT 6.1  ALBUMIN 1.6*   Cardiac Enzymes:  Recent Labs  06/22/14 2050 06/23/14 0540  TROPONINI <0.30 <0.30    Recent Labs  06/22/14 1547  TROPIPOC 0.02   BNP: PRO B NATRIURETIC PEPTIDE (BNP)  Date/Time Value Ref Range Status  06/22/2014 03:32 PM 28725.0* 0 - 125 pg/mL Final  04/12/2014 05:20 AM 12801.0* 0 - 125 pg/mL Final   TELE:  SR, ST      Radiology/Studies: Dg Chest 2 View 06/22/2014   CLINICAL DATA:  Shortness of breath, migraine for 4 days, no bowel movement  EXAM: CHEST  2 VIEW  COMPARISON:  04/27/2014  FINDINGS:  There is bilateral diffuse interstitial thickening. There is left lower lobe airspace disease. There is no significant pleural effusion or pneumothorax. There is stable cardiomegaly. The osseous structures are unremarkable.  IMPRESSION: Bilateral diffuse interstitial thickening and cardiomegaly most concerning for mild pulmonary edema. There is hazy left lower lobe airspace disease which may reflect atelectasis versus developing pneumonia.   Electronically Signed   By: Kathreen Devoid   On: 06/22/2014 16:05    Current Medications:  . aspirin EC  81 mg Oral Daily  . carvedilol  25 mg Oral BID  . cloNIDine  0.1 mg Oral Daily  . DULoxetine  60 mg Oral Daily  . enoxaparin (LOVENOX) injection  30 mg Subcutaneous Q24H  . ferrous sulfate  325 mg Oral TID WC  . folic acid  0.5 mg Oral Daily  . furosemide  60 mg Intravenous Q12H  . insulin aspart  0-9 Units Subcutaneous TID WC  . insulin glargine  45 Units Subcutaneous QHS  .  levETIRAcetam  500 mg Oral BID  . polyethylene glycol  17 g Oral BID  . potassium chloride  20 mEq Oral Daily  . pregabalin  75 mg Oral BID  . sodium chloride  3 mL Intravenous Q12H      ASSESSMENT AND PLAN: Principal Problem:   Acute diastolic CHF (congestive heart failure) - volume status much improved, but she still has lower extremity edema and rales on exam. We'll continue IV Lasix today, possible changed to by mouth in a.m.  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  Agree with above as stated.  Patient needs help with meds at home.  i spoke extensively with her son Darcus Austin, who lives with her.  He states she does not take her meds as prescribed.  He is willing to help her with her meds.  I think he needs a pill box to arrange the meds for the week.  Home health at discharge may be helpful as well.  BP controlled on current regimen.  Renal dysfunction due to HTN, DM.   Lorianna Spadaccini S.

## 2014-06-23 NOTE — Plan of Care (Signed)
Problem: Phase I Progression Outcomes Goal: Hemodynamically stable Outcome: Completed/Met Date Met:  06/23/14

## 2014-06-23 NOTE — Plan of Care (Signed)
Problem: Phase I Progression Outcomes Goal: Voiding-avoid urinary catheter unless indicated Outcome: Completed/Met Date Met:  06/23/14     

## 2014-06-24 LAB — GLUCOSE, CAPILLARY
GLUCOSE-CAPILLARY: 55 mg/dL — AB (ref 70–99)
GLUCOSE-CAPILLARY: 59 mg/dL — AB (ref 70–99)
GLUCOSE-CAPILLARY: 42 mg/dL — AB (ref 70–99)
GLUCOSE-CAPILLARY: 47 mg/dL — AB (ref 70–99)
GLUCOSE-CAPILLARY: 75 mg/dL (ref 70–99)
GLUCOSE-CAPILLARY: 71 mg/dL (ref 70–99)
Glucose-Capillary: 100 mg/dL — ABNORMAL HIGH (ref 70–99)
Glucose-Capillary: 101 mg/dL — ABNORMAL HIGH (ref 70–99)
Glucose-Capillary: 120 mg/dL — ABNORMAL HIGH (ref 70–99)
Glucose-Capillary: 156 mg/dL — ABNORMAL HIGH (ref 70–99)
Glucose-Capillary: 51 mg/dL — ABNORMAL LOW (ref 70–99)
Glucose-Capillary: 71 mg/dL (ref 70–99)
Glucose-Capillary: 73 mg/dL (ref 70–99)
Glucose-Capillary: 73 mg/dL (ref 70–99)
Glucose-Capillary: 86 mg/dL (ref 70–99)

## 2014-06-24 LAB — BASIC METABOLIC PANEL
Anion gap: 11 (ref 5–15)
BUN: 28 mg/dL — AB (ref 6–23)
CO2: 21 mEq/L (ref 19–32)
Calcium: 7.8 mg/dL — ABNORMAL LOW (ref 8.4–10.5)
Chloride: 107 mEq/L (ref 96–112)
Creatinine, Ser: 3.47 mg/dL — ABNORMAL HIGH (ref 0.50–1.10)
GFR, EST AFRICAN AMERICAN: 17 mL/min — AB (ref >90)
GFR, EST NON AFRICAN AMERICAN: 15 mL/min — AB (ref >90)
Glucose, Bld: 49 mg/dL — ABNORMAL LOW (ref 70–99)
Potassium: 4.1 mEq/L (ref 3.7–5.3)
Sodium: 139 mEq/L (ref 137–147)

## 2014-06-24 MED ORDER — INSULIN GLARGINE 100 UNIT/ML ~~LOC~~ SOLN: 45.0000 [IU] | Freq: Every day | SUBCUTANEOUS | Status: DC

## 2014-06-24 MED ORDER — DULOXETINE HCL 30 MG PO CPEP
30.0000 mg | ORAL_CAPSULE | Freq: Every day | ORAL | Status: DC
  Filled 2014-06-24: qty 1

## 2014-06-24 MED ORDER — LEVETIRACETAM 500 MG PO TABS: 500.0000 mg | ORAL_TABLET | Freq: Two times a day (BID) | ORAL | Status: DC

## 2014-06-24 MED ORDER — OXYCODONE-ACETAMINOPHEN 5-325 MG PO TABS: 1.0000 | ORAL_TABLET | Freq: Four times a day (QID) | ORAL | Status: DC | PRN

## 2014-06-24 MED ORDER — DULOXETINE HCL 60 MG PO CPEP: 60.0000 mg | ORAL_CAPSULE | Freq: Every day | ORAL | Status: DC

## 2014-06-24 MED ORDER — CLONIDINE HCL 0.1 MG PO TABS: 0.1000 mg | ORAL_TABLET | Freq: Three times a day (TID) | ORAL | Status: DC | PRN

## 2014-06-24 MED ORDER — FERROUS SULFATE 325 (65 FE) MG PO TABS: 325.0000 mg | ORAL_TABLET | Freq: Every day | ORAL | Status: DC

## 2014-06-24 MED ORDER — INSULIN GLARGINE 100 UNIT/ML ~~LOC~~ SOLN: 35.0000 [IU] | Freq: Every day | SUBCUTANEOUS | Status: DC

## 2014-06-24 MED ORDER — CARVEDILOL 25 MG PO TABS: 25.0000 mg | ORAL_TABLET | Freq: Two times a day (BID) | ORAL | Status: DC

## 2014-06-24 MED ORDER — DEXTROSE 5 % IV SOLN: INTRAVENOUS | Status: DC

## 2014-06-24 MED ORDER — DEXTROSE 50 % IV SOLN
1.0000 | INTRAVENOUS | Status: DC | PRN
  Administered 2014-06-24 (×2): 50 mL via INTRAVENOUS
  Filled 2014-06-24 (×2): qty 50

## 2014-06-24 NOTE — Progress Notes (Signed)
Hypoglycemic Event  CBG: 47  Treatment: 4oz apple juice and peanut butter crackers  Symptoms: none  Follow-up CBG: Time:  1130                    CBG Result: 42  Possible Reasons for Event: Medication regimen: 45 units sub Lantus schedeled qhs  Comments/MD notified: Dr. Wynelle Cleveland paged at 1130 with repeat CBG results. 25g amp of Dextrose 50% given IV. Will recheck in 15 minutes for follow up CBG. Still awaiting call back from Dr. Wynelle Cleveland at this time     [name]  Remember to initiate Hypoglycemia Order Set & complete

## 2014-06-24 NOTE — Progress Notes (Signed)
Explained to patient new orders regarding blood sugar results. Patient refusing IV fluids at this time due to fear of leg swelling getting worse. Explained to patient reasoning, patient still refusing. Patient agrees with checking CBG's every 1 hour at this time. Dr. Wynelle Cleveland made aware.

## 2014-06-24 NOTE — Progress Notes (Signed)
Patient's repeat CBG 101. Spoke with Dr. Wynelle Cleveland in regards to patient's CBG results and treatment that was given. Dr. Wynelle Cleveland to speak with patient regarding hypoglycemic events at home. Will continue to monitor.

## 2014-06-24 NOTE — Progress Notes (Addendum)
[facility] Progress Note   [name] [id number] DOB: 1965/09/06 DOA: 06/22/2014 PCP: Maurice Small, D, MD  Brief narrative: [name] is a 48 y.o. female with a Past Medical History of chronic diastolic heart failure, stage IV chronic kidney disease, hypertension, hepatitis C, type 2 diabetes who presents today with exertional dyspnea and increasing pedal edema over the past few days. She states that she ran out of her medications about 4 days ago which is when the symptoms started.   Subjective: She has no complaints today- sugars noted to be low. She tells me that her sugars are erratic at home and she does not always take her bedtime dose of Lantus as ordered. She states that she tends to skip Lantus when she feels her sugars are too low.  Assessment/Plan: Principal Problem:   Acute diastolic CHF -Have resumed diuretics and Coreg-now euvolemic  Active Problems:    Accelerated hypertension -Due to not taking antihypertensives and specifically due to clonidine withdrawal- it is known that she is erratic with taking her medications at home and therefore I will discontinue her clonidine completely -We'll monitor for rebound hypertension while in the hospital and treat accordingly.    DM type 2 causing CKD stage 4 -Placed on home dose of insulin but now is becoming hypoglycemic-patient claims that she does not take her Lantus at times and at this point I am uncertain of what the appropriate dose of Lantus is for her. 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 -Creatinine has increased slightly due to diuretics -Outpatient referral to nephrology   Chronic pain -She states she went to  pain management clinic couple of months ago but was not able to make an appointment to return-she has, therefore run out of her narcotics  -she is now asking me for narcotics prescription upon discharge and states that her PCP will not prescribe it for her  Status post transmetatarsal amputation-left foot -Patient states her balance is not stable because of this and is requesting a PT eval-per PT, she will need outpatient PT-a referral will be made  Right supracondylar humeral fracture -Outpatient follow-up appointment with Dr. Sharol Given to this coming Monday-there is malunion of the fracture and there was some discussion of possible surgery to correct this  Code Status: Full code Disposition Plan: Home in a.m. DVT prophylaxis: Lovenox  Consultants: Cardiology   Antibiotics: Anti-infectives    None         Objective: Filed Weights   06/22/14 1748 06/23/14 0522 06/24/14 0551  Weight: 90 kg (198 lb 6.6 oz) 88.8 kg (195 lb 12.3 oz) 89.858 kg (198 lb 1.6 oz)    Intake/Output Summary (Last 24 hours) at 06/24/14 1402 Last data filed at 06/24/14 1327  Gross per 24 hour  Intake   1663 ml  Output   2600 ml  Net   -937 ml     Vitals Filed Vitals:   06/24/14 0551 06/24/14 0947 06/24/14 1226 06/24/14 1359  BP: 142/78 140/74  128/68  Pulse: 81 74  75  Temp:    97.6 F (36.4 C)  TempSrc:    Oral  Resp: 18 17  18   Height:  Weight: 89.858 kg (198 lb 1.6 oz)     SpO2: 98% 100% 100% 100%    Exam: General: No acute respiratory distress Lungs: Clear to auscultation bilaterally Cardiovascular: Regular rate and rhythm without murmur gallop or rub normal S1 and S2 Abdomen: Nontender, nondistended, soft, bowel sounds positive, no rebound, no ascites, no appreciable mass Extremities: No significant cyanosis, clubbing-  resolution of pitting edema   Data Reviewed: Basic Metabolic Panel:  Recent Labs Lab 06/22/14 1522 06/22/14 2050 06/23/14 0816 06/24/14 0404  NA 137  --   138 139  K 4.0  --  4.4 4.1  CL 107  --  108 107  CO2 18*  --  19 21  GLUCOSE 127*  --  77 49*  BUN 23  --  25* 28*  CREATININE 3.01* 3.11* 3.22* 3.47*  CALCIUM 8.0*  --  7.8* 7.8*   Liver Function Tests:  Recent Labs Lab 06/22/14 1522  AST 26  ALT 20  ALKPHOS 81  BILITOT <0.2*  PROT 6.1  ALBUMIN 1.6*   No results for input(s): LIPASE, AMYLASE in the last 168 hours. No results for input(s): AMMONIA in the last 168 hours. CBC:  Recent Labs Lab 06/22/14 1522 06/22/14 2050  WBC 4.3 4.4  NEUTROABS 2.1  --   HGB 9.0* 9.4*  HCT 27.9* 29.4*  MCV 84.8 84.7  PLT 272 258   Cardiac Enzymes:  Recent Labs Lab 06/22/14 2050 06/23/14 0540 06/23/14 0816  TROPONINI <0.30 <0.30 <0.30   BNP (last 3 results)  Recent Labs  02/23/14 0224 04/12/14 0520 06/22/14 1532  PROBNP 2305.0* 12801.0* 28725.0*   CBG:  Recent Labs Lab 06/24/14 0654 06/24/14 1106 06/24/14 1126 06/24/14 1148 06/24/14 1228  GLUCAP 120* 47* 42* 101* 73    Recent Results (from the past 240 hour(s))  MRSA PCR Screening     Status: None   Collection Time: 06/22/14  9:51 PM  Result Value Ref Range Status   MRSA by PCR NEGATIVE NEGATIVE Final    Comment:        The GeneXpert MRSA Assay (FDA approved for NASAL specimens only), is one component of a comprehensive MRSA colonization surveillance program. It is not intended to diagnose MRSA infection nor to guide or monitor treatment for MRSA infections.      Studies:  Recent x-ray studies have been reviewed in detail by the Attending Physician  Scheduled Meds:  Scheduled Meds: . aspirin EC  81 mg Oral Daily  . carvedilol  25 mg Oral BID  . cloNIDine  0.1 mg Oral Daily  . [START ON 06/25/2014] DULoxetine  30 mg Oral Daily  . enoxaparin (LOVENOX) injection  30 mg Subcutaneous Q24H  . ferrous sulfate  325 mg Oral TID WC  . folic acid  0.5 mg Oral Daily  . insulin aspart  0-9 Units Subcutaneous TID WC  . levETIRAcetam  500 mg Oral BID   . polyethylene glycol  17 g Oral BID  . potassium chloride  20 mEq Oral Daily  . pregabalin  75 mg Oral BID  . sodium chloride  3 mL Intravenous Q12H   Continuous Infusions:   Time spent on care of this patient: 35 minutes   [place], MD 06/24/2014, 2:02 PM  LOS: 2 days   [facility] Office  [phone] Pager - Text Page per [email or web address]  If 7PM-7AM, please contact night-coverage [email or web address]

## 2014-06-24 NOTE — Clinical Documentation Improvement (Signed)
Presents with Acute on Chronic Diastolic CHF, Stage 4 CKD, DM2 with CKD.  There is an assumed link between Hypertension and CKD; no assumed link between Hypertension and Heart Disease.   Accelerated Hypertension due to non compliance documented  ECHO of 04/22/14 reveals moderate left ventricular hypertrophy  Being treated with IV Apresoline if systolic BP is greater than 180  IV Lasix ordered for diuresis  Please clarify if the patient has: Hypertensive Heart and CKD with CHF             Hypertensive CKD with CHF             Other Condition  Thank You, Zoila Shutter ,RN Clinical Documentation Specialist:  [place] [employer]

## 2014-06-24 NOTE — Progress Notes (Signed)
Hypoglycemic Event  CBG: 51  Treatment: Provided PO intake.  Symptoms: Diaphoresis, nervousness.  Follow-up CBG: Time: [id number] CBG Result: 55  Possible Reasons for Event: Basal insulin dose could be too high for patient.  Comments/MD notified: On call provider notified, order obtained for Dextrose 50% IV PRN.  Will continue to monitor patient's condition.    Jamyron Redd T  Remember to initiate Hypoglycemia Order Set & complete

## 2014-06-24 NOTE — Care Management Note (Signed)
    Page 1 of 1   06/24/2014     3:52:31 PM CARE MANAGEMENT NOTE 06/24/2014  Patient:  [name], [name]   Account Number:  [id number]  Date Initiated:  06/24/2014  Documentation initiated by:  Mariann Laster  Subjective/Objective Assessment:   CHF     Action/Plan:   CM to follow for disposition needs   Anticipated DC Date:  06/25/2014   Anticipated DC Plan:  [place]  Other      [facility] Choice  NA   Choice offered to / List presented to:  C-1 Patient           Status of service:  Completed, signed off Medicare Important Message given?  YES (If response is "NO", the following Medicare IM given date fields will be blank) Date Medicare IM given:  06/24/2014 Medicare IM given by:  Pualani Borah Date Additional Medicare IM given:   Additional Medicare IM given by:    Discharge Disposition:  HOME/SELF CARE  Per UR Regulation:  Reviewed for med. necessity/level of care/duration of stay  If discussed at [place] of Stay Meetings, dates discussed:    Comments:  Jessye Imhoff RN, BSN, MSHL, CCM  Nurse - Case Manager,  ([facility] [phone]  06/24/2014 Dispo Plan:  Home / [place] with OP PT services Follow up with [facility].    Why: Outpatient Physical Therapy Services   Contact information:   [address] [place], [place]  [address] Phone [phone] Fax [phone]

## 2014-06-24 NOTE — Plan of Care (Signed)
Problem: Phase II Progression Outcomes Goal: Fluid volume status improved Outcome: Not Progressing

## 2014-06-24 NOTE — Progress Notes (Signed)
Pharmacist HF Discharge Medication Education   Patient was educated on process for home medication reconciliation and provided written instructions as well. Patient was also counseled on HF medications and changes to be made upon arriving home. She was provided with the "Living Better with Heart Failure" patient education packet and was instructed to bring a medication bag with her to her follow up clinic visits with her current medications.  Harolyn Rutherford, PharmD Clinical Pharmacist - Resident Pager: [phone] Pharmacy: [phone] 06/24/2014 12:35 PM

## 2014-06-24 NOTE — Progress Notes (Signed)
Physical Therapy Treatment Patient Details Name: [name] MRN: [id number] DOB: 23-Aug-1965 Today's Date: 07-17-14    History of Present Illness 48 y.o. female with a Past Medical History of chronic diastolic heart failure, stage IV chronic kidney disease, hypertension, hepatitis C, type 2 diabetes who presents today with exertional dyspnea and increasing pedal edema over the past few days. CHF exacerbation.    PT Comments    Patient seen for mobility with use of cane, patient educated on use of cane and demonstrates improved stability and decreased fall risk with cane. Recommend use of cane at all times upon initial discharge.  Follow Up Recommendations  Outpatient PT     Equipment Recommendations  Cane    Recommendations for Other Services       Precautions / Restrictions Precautions Precautions: Fall Restrictions Weight Bearing Restrictions: No    Mobility  Bed Mobility Overal bed mobility: Modified Independent                Transfers Overall transfer level: Needs assistance Equipment used: None Transfers: Sit to/from Stand Sit to Stand: Supervision            Ambulation/Gait Ambulation/Gait assistance: Supervision;Min guard Ambulation Distance (Feet): 120 Feet Assistive device: Straight cane Gait Pattern/deviations: Step-through pattern;Decreased stride length;Decreased weight shift to left;Staggering left;Drifts right/left;Narrow base of support Gait velocity: decreased Gait velocity interpretation: Below normal speed for age/gender General Gait Details: improved stability with use of cane   Stairs            Wheelchair Mobility    Modified Rankin (Stroke Patients Only)       Balance     Sitting balance-Leahy Scale: Good       Standing balance-Leahy Scale: Fair                 High Level Balance Comments: improved stability with use of assistive device    Cognition Arousal/Alertness: Awake/alert Behavior During  Therapy: WFL for tasks assessed/performed Overall Cognitive Status: Within Functional Limits for tasks assessed                      Exercises      General Comments        Pertinent Vitals/Pain Pain Assessment: No/denies pain    Home Living                      Prior Function            PT Goals (current goals can now be found in the care plan section) Acute Rehab PT Goals Patient Stated Goal: to not fall PT Goal Formulation: With patient Time For Goal Achievement: 07/07/14 Potential to Achieve Goals: Good Progress towards PT goals: Progressing toward goals    Frequency  Min 3X/week    PT Plan Current plan remains appropriate    Co-evaluation             End of Session Equipment Utilized During Treatment: Gait belt Activity Tolerance: Patient tolerated treatment well Patient left: in bed;with call [place]/phone within reach     Time: [id number] PT Time Calculation (min) (ACUTE ONLY): 16 min  Charges:  $Gait Training: 8-22 mins                    G CodesDuncan Dull 07-17-14, 4:26 PM Alben Deeds, [place] DPT  [phone]

## 2014-06-24 NOTE — Plan of Care (Signed)
Problem: Phase I Progression Outcomes Goal: Up in chair, BRP Outcome: Completed/Met Date Met:  06/24/14 Goal: Initial discharge plan identified Outcome: Completed/Met Date Met:  06/24/14  Problem: Phase II Progression Outcomes Goal: Dyspnea controlled with activity Outcome: Completed/Met Date Met:  06/24/14

## 2014-06-24 NOTE — Progress Notes (Signed)
Subjective: Feels better, no chest pain  Objective: Vital signs in last 24 hours: Temp:  [97.4 F (36.3 C)-98 F (36.7 C)] 97.4 F (36.3 C) (11/20 0225) Pulse Rate:  [74-81] 74 (11/20 0947) Resp:  [17-18] 17 (11/20 0947) BP: (115-142)/(66-78) 140/74 mmHg (11/20 0947) SpO2:  [98 %-100 %] 100 % (11/20 0947) Weight:  [198 lb 1.6 oz (89.858 kg)] 198 lb 1.6 oz (89.858 kg) (11/20 0551) Weight change: 2 lb 1.6 oz (0.953 kg) Last BM Date: 06/18/14 Intake/Output from previous day: -377 (-1234 since admit) wt 198.1 up from yesterday despite neg I&O, wt withut much change since admit 11/19 0701 - 11/20 0700 In: 1423 [P.O.:1420; I.V.:3] Out: 1800 [Urine:1800] Intake/Output this shift: Total I/O In: -  Out: 350 [Urine:350]  PE: General:Pleasant affect, NAD Skin:Warm and dry, brisk capillary refill HEENT:normocephalic, sclera clear, mucus membranes moist Neck:supple, no JVD, no bruits  Heart:S1S2 RRR without murmur, gallup, rub or click Lungs:clear without rales, rhonchi, or wheezes [id number], non tender, + BS, do not palpate liver spleen or masses Ext:no lower ext edema, 2+ pedal pulses, 2+ radial pulses Neuro:alert and oriented, MAE, follows commands, + facial symmetry   Lab Results:  Recent Labs  06/22/14 1522 06/22/14 2050  WBC 4.3 4.4  HGB 9.0* 9.4*  HCT 27.9* 29.4*  PLT 272 258   BMET  Recent Labs  06/23/14 0816 06/24/14 0404  NA 138 139  K 4.4 4.1  CL 108 107  CO2 19 21  GLUCOSE 77 49*  BUN 25* 28*  CREATININE 3.22* 3.47*  CALCIUM 7.8* 7.8*    Recent Labs  06/23/14 0540 06/23/14 0816  TROPONINI <0.30 <0.30     Lab Results  Component Value Date   HGBA1C 8.8* 04/12/2014       Hepatic Function Panel  Recent Labs  06/22/14 1522  PROT 6.1  ALBUMIN 1.6*  AST 26  ALT 20  ALKPHOS 81  BILITOT <0.2*   No results for input(s): CHOL in the last 72 hours. No results for input(s): PROTIME in the last 72  hours.     Studies/Results: Dg Chest 2 View  06/22/2014   CLINICAL DATA:  Shortness of breath, migraine for 4 days, no bowel movement  EXAM: CHEST  2 VIEW  COMPARISON:  04/27/2014  FINDINGS: There is bilateral diffuse interstitial thickening. There is left lower lobe airspace disease. There is no significant pleural effusion or pneumothorax. There is stable cardiomegaly. The osseous structures are unremarkable.  IMPRESSION: Bilateral diffuse interstitial thickening and cardiomegaly most concerning for mild pulmonary edema. There is hazy left lower lobe airspace disease which may reflect atelectasis versus developing pneumonia.   Electronically Signed   By: Kathreen Devoid   On: 06/22/2014 16:05    Medications: I have reviewed the patient's current medications. Scheduled Meds: . aspirin EC  81 mg Oral Daily  . carvedilol  25 mg Oral BID  . cloNIDine  0.1 mg Oral Daily  . DULoxetine  60 mg Oral Daily  . enoxaparin (LOVENOX) injection  30 mg Subcutaneous Q24H  . ferrous sulfate  325 mg Oral TID WC  . folic acid  0.5 mg Oral Daily  . insulin aspart  0-9 Units Subcutaneous TID WC  . insulin glargine  45 Units Subcutaneous QHS  . levETIRAcetam  500 mg Oral BID  . polyethylene glycol  17 g Oral BID  . potassium chloride  20 mEq Oral Daily  . pregabalin  75 mg Oral BID  .  sodium chloride  3 mL Intravenous Q12H   Continuous Infusions:  PRN Meds:.sodium chloride, acetaminophen, albuterol, dextrose, guaiFENesin-dextromethorphan, hydrALAZINE, ondansetron (ZOFRAN) IV, oxyCODONE-acetaminophen, simethicone, sodium chloride  Assessment/Plan: Principal Problem:  Acute diastolic CHF (congestive heart failure) - volume status much improved, but she still has lower extremity edema but improved and rales/diminished breath sounds on exam. Last dose IV Lasix at 0500 this AM, possible change to po- Cr. Has increased  Pt had not been taking meds at home.  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 :15 minutes. [facility] R  Nurse Practitioner Certified Pager [phone] or after 5pm and on weekends call [phone] 06/24/2014, 11:24 AM   I have examined the patient and reviewed assessment and plan and discussed with patient.  Agree with above as stated.  Agree with trying to stop clonidine to avoid rebound HTN.  Spoke to Dr. Wynelle Cleveland this AM about the fact that the son is willing to help with meds.  Getting them a pill box may be helpful.   [name] Jefferys S.

## 2014-06-25 LAB — BASIC METABOLIC PANEL
ANION GAP: 13 (ref 5–15)
BUN: 28 mg/dL — ABNORMAL HIGH (ref 6–23)
CALCIUM: 7.8 mg/dL — AB (ref 8.4–10.5)
CO2: 20 mEq/L (ref 19–32)
CREATININE: 3.57 mg/dL — AB (ref 0.50–1.10)
Chloride: 102 mEq/L (ref 96–112)
GFR calc Af Amer: 16 mL/min — ABNORMAL LOW (ref >90)
GFR calc non Af Amer: 14 mL/min — ABNORMAL LOW (ref >90)
Glucose, Bld: 181 mg/dL — ABNORMAL HIGH (ref 70–99)
Potassium: 4.7 mEq/L (ref 3.7–5.3)
SODIUM: 135 meq/L — AB (ref 137–147)

## 2014-06-25 LAB — GLUCOSE, CAPILLARY: Glucose-Capillary: 149 mg/dL — ABNORMAL HIGH (ref 70–99)

## 2014-06-25 MED ORDER — T.E.D. KNEE LENGTH/S-REGULAR MISC: 1.0000 | Freq: Every day | Status: DC

## 2014-06-25 MED ORDER — DULOXETINE HCL 60 MG PO CPEP: 30.0000 mg | ORAL_CAPSULE | Freq: Every day | ORAL | Status: DC

## 2014-06-25 MED ORDER — INSULIN GLARGINE 100 UNIT/ML ~~LOC~~ SOLN: 20.0000 [IU] | Freq: Every day | SUBCUTANEOUS | Status: DC

## 2014-06-25 MED ORDER — DULOXETINE HCL 30 MG PO CPEP: 30.0000 mg | ORAL_CAPSULE | Freq: Every day | ORAL | Status: DC

## 2014-06-25 NOTE — Progress Notes (Signed)
Verbalized understanding of AVS prior to release.  Respiratory exchange even and unlabored.  MD aware of edema in lower legs, and describes it as an ongoing chronic issue.

## 2014-06-25 NOTE — Clinical Social Work Psychosocial (Addendum)
    Clinical Social Work Department BRIEF PSYCHOSOCIAL ASSESSMENT 06/23/2014  Patient:  [name], [name]     Account Number:  [id number]     Admit date:  06/22/2014  Clinical Social Worker:  [place], CLINICAL SOCIAL WORKER  Date/Time:  06/23/2014 11:36 AM  Referred by:  Physician  Date Referred:  06/23/2014 Referred for  Psychosocial assessment   Other Referral:   Interview type:  Patient Other interview type:   SW intern talked with pt at bedside about needs.    PSYCHOSOCIAL DATA Living Status:  WITH ADULT CHILDREN Admitted from facility:   Level of care:   Primary support name:  [name]  [id number] Primary support relationship to patient:  CHILD, ADULT Degree of support available:   Patient lives with son and he is very supportive.    [name]  [phone]    CURRENT CONCERNS Current Concerns  Other - See comment   Other Concerns:   Pt questions transportation and possible SNF placement    SOCIAL WORK ASSESSMENT / PLAN SW talked with pt about needs and concerns re: transportation and after care. . Pt has Medicaid transportation and it will start over at the first of December. Patient lives with her son and he will be able to get her necessities that she needs until her transportation services start over. SW student gave pt transportation [employer]. Pt also expressed concerns  re: weakness and the need for rehab. SW ordered physical therapy to come and evaluate patient for SNF. Patient is agreeable to SNF but she has stated concerns about certain facilities in the past. Pt also stated concerns regarding the lack of funds to pay for food. She stated that she wanted a thanksgiving meal. SW gave pt resources for food banks that are doing holiday food drives, and she was also given the 211 number and web adress for other recources. Pt also stated that she wanted to move out eventually due to her son having a baby in December becasue she  sleeps on the floor in the newborn babies soon to be room. She has put in [place] housing authorities application.   Assessment/plan status:  Psychosocial Support/Ongoing Assessment of Needs Other assessment/ plan:   Information/referral to community resources:   Pt was given:  SNF list, referral for pt to come and evaluate  transportation resources for [employer]  F589851607730.org  number and web address  [employer] authorities address and phone number    PATIENT'S/FAMILY'S RESPONSE TO PLAN OF CARE: Patient was very nice and agreeable to receive resources. Pt stated the her son is very supportive but he will be having a newborn baby in [place].She was very appreciative of the resources that the SW gave her. SW will follow up with patient  re: possible SNF placement for short term rehab.  [name]    06/23/14  I have read and reviewed BSW Intern's Assessment and agree to it's content.  CSW services will sign off.

## 2014-06-25 NOTE — Discharge Summary (Signed)
Physician Discharge Summary  [name] [id number] DOB: 1966-02-22 DOA: 06/22/2014  PCP: Maurice Small, D, MD  Admit date: 06/22/2014 Discharge date: 06/25/2014  Time spent: >45 minutes  Recommendations for Outpatient Follow-up:  1. 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 female with a Past Medical History of chronic diastolic heart failure, stage IV chronic kidney disease, hypertension, hepatitis C, type 2 diabetes who presents today with exertional dyspnea and increasing pedal edema over the past few days. She states that she ran out of her medications about 4 days ago which is when the symptoms started.  Hospital Course:  Principal Problem:  Acute diastolic CHF -Have resumed diuretics and Coreg- now euvolemic- in negative balance by about 2 L- at this point she has no pulmonary edema but does have some mild edema in her left lower extremity which she states is chronic. As her creatinine is rising, I do not plan to aggressively diurese her to resolve this mild edema. I have prescribe TED hose for her  Active Problems:   Accelerated hypertension -Due to not taking antihypertensives at home and specifically due to clonidine withdrawal- it is known that she is erratic with taking her medications at home and due to my concern for rebound hypertension when she skips her clonidine, I have decided to discontinue clonidine completely -BP this AM was 115/82   DM type 2 causing CKD stage  4 -Placed on home dose of insulin however, she became hypoglycemic-patient claims that she does not take her Lantus as prescribed and tends to hold her Lantus when she feels that her sugars are too low. 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 -She states she went to pain management clinic couple of months ago but was not able to make an appointment to return-she has, therefore run out of her narcotics  -she is now asking me for a narcotics prescription upon discharge and states that her PCP will not prescribe it for her - I have given her 30 tablets of 5/325 Percocet-she is asking me for 60 tablets but I have made it clear to her that she will not get any more than what I have prescribed her already and needs to go back to pain management as soon as possible -While in the hospital she has been using 1-2 tablets a day therefore  I suspect this prescription will last her 3 weeks.  Status post transmetatarsal amputation-left foot -Patient states her balance is not stable because of this and is requesting a PT eval -per PT, she will need outpatient PT-a referral has been made  Right supracondylar humeral fracture -Outpatient follow-up appointment with Dr. Sharol Given to this coming [place] is malunion of the fracture and patient states that there was some discussion of having surgery to correct  this   Procedures:  None  Consultations:  None  Discharge Exam: Filed Weights   06/23/14 0522 06/24/14 0551 06/25/14 0500  Weight: 88.8 kg (195 lb 12.3 oz) 89.858 kg (198 lb 1.6 oz) 91.808 kg (202 lb 6.4 oz)   Filed Vitals:   06/25/14 0500  BP: 115/82  Pulse: 75  Temp: 97.7 F (36.5 C)  Resp: 18    General: AAO x 3, no distress- morbidly obese Cardiovascular: RRR, no murmurs  Respiratory: clear to auscultation bilaterally GI: soft, non-tender, non-distended, bowel sound positive Extremities: Mild edema of left lower extremity, left transmetatarsal amputation. No edema of right lower extremity. No cyanosis or clubbing  Discharge Instructions You were cared for by a hospitalist during your hospital stay. If you have any questions about your discharge medications or the care you received while you were in the hospital after you are discharged, you can call the unit and asked to speak with the hospitalist on call if the hospitalist that took care of you is not available. Once you are discharged, your primary care physician will handle any further medical issues. Please note that NO REFILLS for any discharge medications will be authorized once you are discharged, as it is imperative that you return to your primary care physician (or establish a relationship with a primary care physician if you do not have one) for your aftercare needs so that they can reassess your need for medications and monitor your lab values.  Discharge Instructions    Diet - low sodium heart healthy    Complete by:  As directed   Diabetic diet     Increase activity slowly    Complete by:  As directed             Medication List    STOP taking these medications        pregabalin 75 MG capsule- the patient states that although she was prescribed this she was not taking it because it made her "feel funny"   Commonly known as:  LYRICA      TAKE these medications        carvedilol 25 MG tablet   Commonly known as:  COREG  Take 1 tablet (25 mg total) by mouth 2 (two) times daily.     cloNIDine 0.1 MG tablet  Commonly known as:  CATAPRES  Take 1 tablet (0.1 mg total) by mouth 3 (three) times daily as needed (for hypertension).     DULoxetine 30 MG capsule  Commonly known as:  CYMBALTA  Take 1 capsule (30 mg total) by mouth daily.     ferrous sulfate 325 (65 FE) MG tablet  Take 1 tablet (325 mg total) by mouth daily with breakfast.     folic acid [id number] MCG tablet  Commonly known as:  FOLVITE  Take 400 mcg by mouth every morning.     furosemide 20 MG tablet  Commonly known as:  LASIX  Take 3 tablets (60 mg total) by mouth every morning.     insulin aspart 100 UNIT/ML injection  Commonly known as:  novoLOG  Inject 2-10 Units into the skin 3 (three) times daily before meals. SSI: 0-200 = 2 units, 201-250 = 4 units, 251-300 = 6 units, 301-350 = 8 units, 351-400 = 10 units     insulin glargine 100 UNIT/ML injection  Commonly known as:  LANTUS  Inject 0.2 mLs (20 Units total) into the skin at bedtime.     levETIRAcetam 500 MG tablet  Commonly known as:  KEPPRA  Take 1 tablet (500 mg total) by mouth 2 (two) times daily.     oxyCODONE-acetaminophen 5-325 MG per tablet  Commonly known as:  PERCOCET/ROXICET  Take 1 tablet by mouth every 6 (six) hours as needed for moderate pain.     polyethylene glycol packet  Commonly known as:  MIRALAX / GLYCOLAX  Take 17 g by mouth 2 (two) times daily.     SENIOR MULTIVITAMIN PLUS PO  Take 1 tablet by mouth every morning.     silver sulfADIAZINE 1 % cream  Commonly known as:  SILVADENE  Apply topically daily.     simethicone 80 MG chewable tablet  Commonly known as:  MYLICON  Chew 80 mg by mouth every 8 (eight) hours as needed for flatulence.     T.E.D. KNEE LENGTH/S-REGULAR Misc  1 each by Does not apply route daily.       Allergies  Allergen Reactions  . Gabapentin Itching and Swelling  . Saphris [Asenapine] Swelling     Swelling of the face  . Tramadol Swelling    Causes legs to swell  . Penicillins Hives    Has tolerated Cefepime and Ceftriaxone.       Follow-up Information    Follow up with Lujean Amel, MD On 06/29/2014.   Specialty:  Family Medicine   Why:  Appt: is for 11:45am Please contact Office with any changes   Contact information:   [place] [address] [address] [phone]       Follow up with [facility].   Why:  Outpatient Physical Therapy Services    Contact information:   [address] [place], [place]  [address] Phone [phone] Fax [phone]       The results of significant diagnostics from this hospitalization (including imaging, microbiology, ancillary and laboratory) are listed below for reference.    Significant Diagnostic Studies: Dg Chest 2 View  06/22/2014   CLINICAL DATA:  Shortness of breath, migraine for 4 days, no bowel movement  EXAM: CHEST  2 VIEW  COMPARISON:  04/27/2014  FINDINGS: There is bilateral diffuse interstitial thickening. There is left lower lobe airspace disease. There is no significant pleural effusion or pneumothorax. There is stable cardiomegaly. The osseous structures are unremarkable.  IMPRESSION: Bilateral diffuse interstitial thickening and cardiomegaly most concerning for mild pulmonary edema. There is hazy left lower lobe airspace disease which may reflect atelectasis versus developing pneumonia.   Electronically Signed   By: Kathreen Devoid   On: 06/22/2014 16:05    Microbiology: Recent Results (from the past 240 hour(s))  MRSA PCR Screening     Status: None   Collection Time: 06/22/14  9:51 PM  Result Value Ref Range Status   MRSA by PCR NEGATIVE NEGATIVE Final    Comment:        The GeneXpert MRSA Assay (FDA approved for NASAL specimens only), is one component of a comprehensive MRSA colonization surveillance program. It is not intended to diagnose MRSA infection nor to guide  or  monitor treatment for MRSA infections.      Labs: Basic Metabolic Panel:  Recent Labs Lab 06/22/14 1522 06/22/14 2050 06/23/14 0816 06/24/14 0404 06/25/14 0425  NA 137  --  138 139 135*  K 4.0  --  4.4 4.1 4.7  CL 107  --  108 107 102  CO2 18*  --  19 21 20   GLUCOSE 127*  --  77 49* 181*  BUN 23  --  25* 28* 28*  CREATININE 3.01* 3.11* 3.22* 3.47* 3.57*  CALCIUM 8.0*  --  7.8* 7.8* 7.8*   Liver Function Tests:  Recent Labs Lab 06/22/14 1522  AST 26  ALT 20  ALKPHOS 81  BILITOT <0.2*  PROT 6.1  ALBUMIN 1.6*   No results for input(s): LIPASE, AMYLASE in the last 168 hours. No results for input(s): AMMONIA in the last 168 hours. CBC:  Recent Labs Lab 06/22/14 1522 06/22/14 2050  WBC 4.3 4.4  NEUTROABS 2.1  --   HGB 9.0* 9.4*  HCT 27.9* 29.4*  MCV 84.8 84.7  PLT 272 258   Cardiac Enzymes:  Recent Labs Lab 06/22/14 2050 06/23/14 0540 06/23/14 0816  TROPONINI <0.30 <0.30 <0.30   BNP: BNP (last 3 results)  Recent Labs  02/23/14 0224 04/12/14 0520 06/22/14 1532  PROBNP 2305.0* 12801.0* 28725.0*   CBG:  Recent Labs Lab 06/24/14 1534 06/24/14 1633 06/24/14 2013 06/24/14 2359 06/25/14 0619  GLUCAP 71 71 100* 156* 149*       SignedDebbe Odea, MD [facility] 06/25/2014, 10:34 AM

## 2014-07-03 ENCOUNTER — Emergency Department (HOSPITAL_COMMUNITY)
Admission: EM | Admit: 2014-07-03 | Discharge: 2014-07-03 | Disposition: A | Discharge status: Home / Self Care | Payer: Medicare Other | Source: Home / Self Care | Attending: Emergency Medicine | Admitting: Emergency Medicine

## 2014-07-03 DIAGNOSIS — I509 Heart failure, unspecified: Secondary | ICD-10-CM

## 2014-07-03 DIAGNOSIS — G40909 Epilepsy, unspecified, not intractable, without status epilepticus: Secondary | ICD-10-CM

## 2014-07-03 DIAGNOSIS — E119 Type 2 diabetes mellitus without complications: Secondary | ICD-10-CM

## 2014-07-03 DIAGNOSIS — Z79891 Long term (current) use of opiate analgesic: Secondary | ICD-10-CM

## 2014-07-03 DIAGNOSIS — Z89432 Acquired absence of left foot: Secondary | ICD-10-CM | POA: Insufficient documentation

## 2014-07-03 DIAGNOSIS — Z8619 Personal history of other infectious and parasitic diseases: Secondary | ICD-10-CM | POA: Insufficient documentation

## 2014-07-03 DIAGNOSIS — F319 Bipolar disorder, unspecified: Secondary | ICD-10-CM

## 2014-07-03 DIAGNOSIS — L03115 Cellulitis of right lower limb: Secondary | ICD-10-CM

## 2014-07-03 DIAGNOSIS — Z792 Long term (current) use of antibiotics: Secondary | ICD-10-CM

## 2014-07-03 DIAGNOSIS — Z79899 Other long term (current) drug therapy: Secondary | ICD-10-CM | POA: Insufficient documentation

## 2014-07-03 DIAGNOSIS — Z87891 Personal history of nicotine dependence: Secondary | ICD-10-CM

## 2014-07-03 DIAGNOSIS — N183 Chronic kidney disease, stage 3 (moderate): Secondary | ICD-10-CM

## 2014-07-03 DIAGNOSIS — I129 Hypertensive chronic kidney disease with stage 1 through stage 4 chronic kidney disease, or unspecified chronic kidney disease: Secondary | ICD-10-CM

## 2014-07-03 DIAGNOSIS — I252 Old myocardial infarction: Secondary | ICD-10-CM | POA: Insufficient documentation

## 2014-07-03 DIAGNOSIS — Z794 Long term (current) use of insulin: Secondary | ICD-10-CM

## 2014-07-03 DIAGNOSIS — G894 Chronic pain syndrome: Secondary | ICD-10-CM | POA: Insufficient documentation

## 2014-07-03 DIAGNOSIS — Z880 Allergy status to penicillin: Secondary | ICD-10-CM | POA: Insufficient documentation

## 2014-07-03 MED ORDER — SULFAMETHOXAZOLE-TRIMETHOPRIM 800-160 MG PO TABS: 1.0000 | ORAL_TABLET | Freq: Two times a day (BID) | ORAL | Status: DC

## 2014-07-03 MED ORDER — SULFAMETHOXAZOLE-TRIMETHOPRIM 800-160 MG PO TABS
1.0000 | ORAL_TABLET | Freq: Once | ORAL | Status: AC
  Administered 2014-07-03: 1 via ORAL
  Filled 2014-07-03: qty 1

## 2014-07-03 NOTE — ED Provider Notes (Signed)
CSN: [id number]     Arrival date & time 07/03/14  1503 History   First MD Initiated Contact with Patient 07/03/14 1522     Chief Complaint  Patient presents with  . Leg Pain    right lower leg pain      HPI Patient reports new swelling and redness of her right lower leg.  She has bilateral edema but reports new redness and pain of her proximal right posterior calf.  She states this is new over the past 48 hours.  She denies fevers and chills.  No spreading redness.  No recent injury or trauma to her right lower extremity.  No history of DVT.  Pain is mild in severity.  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   Past Medical History  Diagnosis Date  . Neuropathy   . Glaucoma   . Bipolar 1 disorder   . CHF (congestive heart failure)   . Refusal of blood transfusions as patient is Jehovah's Witness   . Hypertension   . Shortness of breath   . TIA (transient ischemic attack) 2010  . Neuromuscular disorder     DIABETIC NEUROPATHY  . MRSA infection ?2014    "on the back of my head; spread throughout my bloodstream"  . Bipolar disorder, unspecified 02/06/2014  . High cholesterol   . Type II diabetes mellitus     INSULIN DEPENDENT  . Hepatitis C   . Seizure dx'd 2014    "don't know what kind; last one was ~ 04/2014"  . Chronic pain syndrome   . Chronic kidney disease (CKD), stage III (moderate)   . Schizoaffective disorder, unspecified condition 02/08/2014   Past Surgical History  Procedure Laterality Date  . Amputation Left 05/21/2013    Procedure: Left Midfoot AMPUTATION;  Surgeon: Newt Minion, MD;  Location: [facility];  Service: Orthopedics;  Laterality: Left;  Left Midfoot Amputation  . Tubal ligation    . Colonoscopy with propofol N/A 06/22/2013    Procedure: COLONOSCOPY WITH PROPOFOL;  Surgeon: Jeryl Columbia, MD;  Location: [facility]  ENDOSCOPY;  Service: Endoscopy;  Laterality: N/A;  . Abdominal hysterectomy  2014  . Tonsillectomy  1970's  . Refractive surgery Bilateral 2013   Family History  Problem Relation Age of Onset  . Hypertension Mother   . Hypertension Father    History  Substance Use Topics  . Smoking status: Former Smoker -- 0.25 packs/day for 30 years    Types: Cigarettes    Quit date: 06/17/2014  . Smokeless tobacco: Never Used     Comment: "stopped smoking in ~ 2013; restarted last week; stopped again 06/17/2014"  . Alcohol Use: 18.0 oz/week    30 Cans of beer per week     Comment: 06/22/2014 "2-3, 40oz beers; 3 days/wk"   OB History    No data available     Review of Systems  All other systems reviewed and are negative.     Allergies  Gabapentin; Saphris; Tramadol; and Penicillins  Home Medications   Prior to Admission medications   Medication Sig Start Date End Date Taking? Authorizing Provider  carvedilol (COREG) 25 MG tablet Take 1 tablet (25 mg total) by mouth 2 (two) times daily. 06/24/14   Debbe Odea, MD  cloNIDine (CATAPRES) 0.1 MG tablet Take 1 tablet (0.1 mg total) by mouth 3 (  three) times daily as needed (for hypertension). 06/24/14   Debbe Odea, MD  DULoxetine (CYMBALTA) 30 MG capsule Take 1 capsule (30 mg total) by mouth daily. 06/25/14   Debbe Odea, MD  Elastic Bandages & Supports (T.E.D. KNEE LENGTH/S-REGULAR) MISC 1 each by Does not apply route daily. 06/25/14   Debbe Odea, MD  ferrous sulfate 325 (65 FE) MG tablet Take 1 tablet (325 mg total) by mouth daily with breakfast. 06/24/14   Debbe Odea, MD  folic acid (FOLVITE) A999333 MCG tablet Take 400 mcg by mouth every morning.    Historical Provider, MD  furosemide (LASIX) 20 MG tablet Take 3 tablets (60 mg total) by mouth every morning. 04/25/14   Theodis Blaze, MD  insulin aspart (NOVOLOG) 100 UNIT/ML injection Inject 2-10 Units into the skin 3 (three) times daily before meals. SSI: 0-200 = 2 units, 201-250 = 4  units, 251-300 = 6 units, 301-350 = 8 units, 351-400 = 10 units    Historical Provider, MD  insulin glargine (LANTUS) 100 UNIT/ML injection Inject 0.2 mLs (20 Units total) into the skin at bedtime. 06/25/14   Debbe Odea, MD  levETIRAcetam (KEPPRA) 500 MG tablet Take 1 tablet (500 mg total) by mouth 2 (two) times daily. 06/24/14   Debbe Odea, MD  Multiple Vitamins-Minerals (SENIOR MULTIVITAMIN PLUS PO) Take 1 tablet by mouth every morning.    Historical Provider, MD  oxyCODONE-acetaminophen (PERCOCET/ROXICET) 5-325 MG per tablet Take 1 tablet by mouth every 6 (six) hours as needed for moderate pain. 06/24/14   Debbe Odea, MD  polyethylene glycol (MIRALAX / GLYCOLAX) packet Take 17 g by mouth 2 (two) times daily.    Historical Provider, MD  silver sulfADIAZINE (SILVADENE) 1 % cream Apply topically daily. 04/14/14   Nishant Dhungel, MD  simethicone (MYLICON) 80 MG chewable tablet Chew 80 mg by mouth every 8 (eight) hours as needed for flatulence.    Historical Provider, MD  sulfamethoxazole-trimethoprim (SEPTRA DS) 800-160 MG per tablet Take 1 tablet by mouth every 12 (twelve) hours. 07/03/14   Hoy Morn, MD   BP 155/88 mmHg  Pulse 80  Temp(Src) 97.8 F (36.6 C) (Oral)  Resp 18  Ht 5\' 4"  (1.626 m)  Wt 203 lb (92.08 kg)  BMI 34.83 kg/m2  SpO2 99% Physical Exam  Constitutional: She is oriented to person, place, and time. She appears well-developed and well-nourished. No distress.  HENT:  Head: Normocephalic and atraumatic.  Eyes: EOM are normal.  Neck: Normal range of motion.  Cardiovascular: Normal rate, regular rhythm and normal heart sounds.   Pulmonary/Chest: Effort normal and breath sounds normal.  Abdominal: Soft. She exhibits no distension. There is no tenderness.  Musculoskeletal: Normal range of motion.  1+ pitting edema bilaterally.  Patient with erythema, warmth, induration without fluctuance for posterior right proximal calf which is about the size of the palm.  Normal  pulses in right foot.  Multiple healed dark lesions of her bilateral lower extremities consistent with old infection that is now healed.  Neurological: She is alert and oriented to person, place, and time.  Skin: Skin is warm and dry.  Psychiatric: She has a normal mood and affect. Judgment normal.  Nursing note and vitals reviewed.   ED Course  Procedures (including critical care time)  ULTRASOUND LIMITED SOFT TISSUE: Left Lower extremity Indication: cellulitis/evaluate for abscess Linear probe used to evaluate area of interest in two planes. Findings:  No free fluid noted, no abscess noted.  Performed by: Dr Venora Maples Images  saved electronically  Labs Review Labs Reviewed - No data to display  Imaging Review No results found.   EKG Interpretation None      MDM   Final diagnoses:  Cellulitis of right lower extremity    Patient with cellulitis of her right lower extremity.  I've asked that she return to the ER and 36 hours for recheck or sooner as needed for new or worsening symptoms.  I think we should give this a trial as an outpatient with outpatient antibiotics.  She is a high risk for worsening given her diabetes.  I've asked that she return to the ER for new or worsening symptoms otherwise we will plan on seeing her back in 36 hours in the ER for recheck.  Home on Bactrim.  Bedside ultrasound demonstrates no fluid collection.    Hoy Morn, MD 07/03/14 [id number]

## 2014-07-03 NOTE — Discharge Instructions (Signed)

## 2014-07-03 NOTE — ED Notes (Signed)
Pt c/o right lower leg swelling x 3 days - states swelling has decreased but c/o feels 2 "knots" to calf.

## 2014-07-05 ENCOUNTER — Encounter (HOSPITAL_COMMUNITY): Payer: Self-pay | Admitting: Emergency Medicine

## 2014-07-05 ENCOUNTER — Inpatient Hospital Stay (HOSPITAL_COMMUNITY)
Admission: EM | Admit: 2014-07-05 | Discharge: 2014-07-10 | DRG: 603 | Disposition: A | Discharge status: Home / Self Care | Payer: Medicare Other | Attending: Internal Medicine | Admitting: Internal Medicine

## 2014-07-05 DIAGNOSIS — F319 Bipolar disorder, unspecified: Secondary | ICD-10-CM | POA: Diagnosis present

## 2014-07-05 DIAGNOSIS — Z87891 Personal history of nicotine dependence: Secondary | ICD-10-CM

## 2014-07-05 DIAGNOSIS — F259 Schizoaffective disorder, unspecified: Secondary | ICD-10-CM | POA: Diagnosis present

## 2014-07-05 DIAGNOSIS — D708 Other neutropenia: Secondary | ICD-10-CM | POA: Diagnosis present

## 2014-07-05 DIAGNOSIS — D638 Anemia in other chronic diseases classified elsewhere: Secondary | ICD-10-CM | POA: Diagnosis present

## 2014-07-05 DIAGNOSIS — I129 Hypertensive chronic kidney disease with stage 1 through stage 4 chronic kidney disease, or unspecified chronic kidney disease: Secondary | ICD-10-CM | POA: Diagnosis present

## 2014-07-05 DIAGNOSIS — Z8673 Personal history of transient ischemic attack (TIA), and cerebral infarction without residual deficits: Secondary | ICD-10-CM | POA: Diagnosis not present

## 2014-07-05 DIAGNOSIS — Z89432 Acquired absence of left foot: Secondary | ICD-10-CM

## 2014-07-05 DIAGNOSIS — H409 Unspecified glaucoma: Secondary | ICD-10-CM | POA: Diagnosis present

## 2014-07-05 DIAGNOSIS — N184 Chronic kidney disease, stage 4 (severe): Secondary | ICD-10-CM

## 2014-07-05 DIAGNOSIS — Z531 Procedure and treatment not carried out because of patient's decision for reasons of belief and group pressure: Secondary | ICD-10-CM | POA: Diagnosis present

## 2014-07-05 DIAGNOSIS — G40909 Epilepsy, unspecified, not intractable, without status epilepticus: Secondary | ICD-10-CM | POA: Diagnosis present

## 2014-07-05 DIAGNOSIS — S42411K Displaced simple supracondylar fracture without intercondylar fracture of right humerus, subsequent encounter for fracture with nonunion: Secondary | ICD-10-CM

## 2014-07-05 DIAGNOSIS — IMO0001 Reserved for inherently not codable concepts without codable children: Secondary | ICD-10-CM

## 2014-07-05 DIAGNOSIS — G629 Polyneuropathy, unspecified: Secondary | ICD-10-CM | POA: Diagnosis present

## 2014-07-05 DIAGNOSIS — L03115 Cellulitis of right lower limb: Principal | ICD-10-CM

## 2014-07-05 DIAGNOSIS — G894 Chronic pain syndrome: Secondary | ICD-10-CM | POA: Diagnosis present

## 2014-07-05 DIAGNOSIS — E11319 Type 2 diabetes mellitus with unspecified diabetic retinopathy without macular edema: Secondary | ICD-10-CM | POA: Diagnosis present

## 2014-07-05 DIAGNOSIS — Z794 Long term (current) use of insulin: Secondary | ICD-10-CM

## 2014-07-05 DIAGNOSIS — K5900 Constipation, unspecified: Secondary | ICD-10-CM | POA: Diagnosis not present

## 2014-07-05 DIAGNOSIS — N179 Acute kidney failure, unspecified: Secondary | ICD-10-CM

## 2014-07-05 DIAGNOSIS — M79601 Pain in right arm: Secondary | ICD-10-CM

## 2014-07-05 DIAGNOSIS — M25521 Pain in right elbow: Secondary | ICD-10-CM

## 2014-07-05 DIAGNOSIS — I5032 Chronic diastolic (congestive) heart failure: Secondary | ICD-10-CM

## 2014-07-05 DIAGNOSIS — Z8249 Family history of ischemic heart disease and other diseases of the circulatory system: Secondary | ICD-10-CM

## 2014-07-05 DIAGNOSIS — N189 Chronic kidney disease, unspecified: Secondary | ICD-10-CM

## 2014-07-05 DIAGNOSIS — M79604 Pain in right leg: Secondary | ICD-10-CM

## 2014-07-05 DIAGNOSIS — E875 Hyperkalemia: Secondary | ICD-10-CM | POA: Diagnosis not present

## 2014-07-05 DIAGNOSIS — E1122 Type 2 diabetes mellitus with diabetic chronic kidney disease: Secondary | ICD-10-CM | POA: Diagnosis present

## 2014-07-05 DIAGNOSIS — L03116 Cellulitis of left lower limb: Secondary | ICD-10-CM | POA: Diagnosis present

## 2014-07-05 DIAGNOSIS — E1140 Type 2 diabetes mellitus with diabetic neuropathy, unspecified: Secondary | ICD-10-CM | POA: Diagnosis present

## 2014-07-05 DIAGNOSIS — G8929 Other chronic pain: Secondary | ICD-10-CM

## 2014-07-05 DIAGNOSIS — N185 Chronic kidney disease, stage 5: Secondary | ICD-10-CM | POA: Diagnosis present

## 2014-07-05 LAB — CBC WITH DIFFERENTIAL/PLATELET
BASOS ABS: 0 10*3/uL (ref 0.0–0.1)
Basophils Relative: 1 % (ref 0–1)
Eosinophils Absolute: 0.1 10*3/uL (ref 0.0–0.7)
Eosinophils Relative: 3 % (ref 0–5)
HCT: 25.6 % — ABNORMAL LOW (ref 36.0–46.0)
Hemoglobin: 8.2 g/dL — ABNORMAL LOW (ref 12.0–15.0)
LYMPHS ABS: 1.5 10*3/uL (ref 0.7–4.0)
Lymphocytes Relative: 43 % (ref 12–46)
MCH: 26.6 pg (ref 26.0–34.0)
MCHC: 32 g/dL (ref 30.0–36.0)
MCV: 83.1 fL (ref 78.0–100.0)
Monocytes Absolute: 0.3 10*3/uL (ref 0.1–1.0)
Monocytes Relative: 8 % (ref 3–12)
Neutro Abs: 1.6 10*3/uL — ABNORMAL LOW (ref 1.7–7.7)
Neutrophils Relative %: 45 % (ref 43–77)
PLATELETS: 218 10*3/uL (ref 150–400)
RBC: 3.08 MIL/uL — AB (ref 3.87–5.11)
RDW: 15 % (ref 11.5–15.5)
WBC: 3.5 10*3/uL — ABNORMAL LOW (ref 4.0–10.5)

## 2014-07-05 LAB — COMPREHENSIVE METABOLIC PANEL
ALT: 21 U/L (ref 0–35)
AST: 28 U/L (ref 0–37)
Albumin: 1.7 g/dL — ABNORMAL LOW (ref 3.5–5.2)
Alkaline Phosphatase: 84 U/L (ref 39–117)
Anion gap: 14 (ref 5–15)
BUN: 46 mg/dL — ABNORMAL HIGH (ref 6–23)
CO2: 20 meq/L (ref 19–32)
Calcium: 8.1 mg/dL — ABNORMAL LOW (ref 8.4–10.5)
Chloride: 101 mEq/L (ref 96–112)
Creatinine, Ser: 3.47 mg/dL — ABNORMAL HIGH (ref 0.50–1.10)
GFR, EST AFRICAN AMERICAN: 17 mL/min — AB (ref >90)
GFR, EST NON AFRICAN AMERICAN: 15 mL/min — AB (ref >90)
GLUCOSE: 146 mg/dL — AB (ref 70–99)
Potassium: 4.2 mEq/L (ref 3.7–5.3)
SODIUM: 135 meq/L — AB (ref 137–147)
Total Protein: 6.3 g/dL (ref 6.0–8.3)

## 2014-07-05 LAB — GLUCOSE, CAPILLARY: Glucose-Capillary: 264 mg/dL — ABNORMAL HIGH (ref 70–99)

## 2014-07-05 MED ORDER — DULOXETINE HCL 30 MG PO CPEP
30.0000 mg | ORAL_CAPSULE | Freq: Every day | ORAL | Status: DC
  Administered 2014-07-05 – 2014-07-10 (×6): 30 mg via ORAL
  Filled 2014-07-05 (×8): qty 1

## 2014-07-05 MED ORDER — ONDANSETRON HCL 4 MG PO TABS: 4.0000 mg | ORAL_TABLET | Freq: Four times a day (QID) | ORAL | Status: DC | PRN

## 2014-07-05 MED ORDER — OXYCODONE-ACETAMINOPHEN 5-325 MG PO TABS
2.0000 | ORAL_TABLET | Freq: Four times a day (QID) | ORAL | Status: DC | PRN
  Administered 2014-07-06 – 2014-07-07 (×2): 2 via ORAL
  Filled 2014-07-05 (×3): qty 2

## 2014-07-05 MED ORDER — FUROSEMIDE 10 MG/ML IJ SOLN
INTRAMUSCULAR | Status: AC
  Administered 2014-07-05: 18:00:00
  Filled 2014-07-05: qty 8

## 2014-07-05 MED ORDER — FUROSEMIDE 10 MG/ML IJ SOLN
80.0000 mg | Freq: Four times a day (QID) | INTRAMUSCULAR | Status: DC
  Administered 2014-07-05 – 2014-07-06 (×3): 80 mg via INTRAVENOUS
  Filled 2014-07-05 (×6): qty 8

## 2014-07-05 MED ORDER — VANCOMYCIN HCL IN DEXTROSE 750-5 MG/150ML-% IV SOLN
750.0000 mg | INTRAVENOUS | Status: DC
  Administered 2014-07-05 – 2014-07-07 (×3): 750 mg via INTRAVENOUS
  Filled 2014-07-05 (×3): qty 150

## 2014-07-05 MED ORDER — POLYETHYLENE GLYCOL 3350 17 G PO PACK
17.0000 g | PACK | Freq: Every day | ORAL | Status: DC | PRN
  Filled 2014-07-05: qty 1

## 2014-07-05 MED ORDER — POLYETHYLENE GLYCOL 3350 17 G PO PACK
17.0000 g | PACK | Freq: Two times a day (BID) | ORAL | Status: DC
  Administered 2014-07-05 – 2014-07-08 (×6): 17 g via ORAL
  Filled 2014-07-05 (×8): qty 1

## 2014-07-05 MED ORDER — MORPHINE SULFATE 2 MG/ML IJ SOLN
2.0000 mg | INTRAMUSCULAR | Status: DC | PRN
  Administered 2014-07-05: 2 mg via INTRAVENOUS
  Filled 2014-07-05: qty 1

## 2014-07-05 MED ORDER — INSULIN GLARGINE 100 UNIT/ML ~~LOC~~ SOLN
20.0000 [IU] | Freq: Every day | SUBCUTANEOUS | Status: DC
  Filled 2014-07-05 (×2): qty 0.2

## 2014-07-05 MED ORDER — CLONIDINE HCL 0.1 MG PO TABS: 0.1000 mg | ORAL_TABLET | Freq: Three times a day (TID) | ORAL | Status: DC | PRN

## 2014-07-05 MED ORDER — MORPHINE SULFATE 4 MG/ML IJ SOLN
4.0000 mg | INTRAMUSCULAR | Status: DC | PRN
  Administered 2014-07-06 – 2014-07-10 (×10): 4 mg via INTRAVENOUS
  Filled 2014-07-05 (×10): qty 1

## 2014-07-05 MED ORDER — ONDANSETRON HCL 4 MG/2ML IJ SOLN: 4.0000 mg | Freq: Four times a day (QID) | INTRAMUSCULAR | Status: DC | PRN

## 2014-07-05 MED ORDER — SODIUM CHLORIDE 0.9 % IV SOLN
INTRAVENOUS | Status: DC
  Administered 2014-07-05: 08:00:00 via INTRAVENOUS

## 2014-07-05 MED ORDER — INSULIN ASPART 100 UNIT/ML ~~LOC~~ SOLN
0.0000 [IU] | Freq: Three times a day (TID) | SUBCUTANEOUS | Status: DC
  Administered 2014-07-06: 2 [IU] via SUBCUTANEOUS
  Administered 2014-07-06 – 2014-07-07 (×3): 3 [IU] via SUBCUTANEOUS
  Administered 2014-07-07: 2 [IU] via SUBCUTANEOUS
  Administered 2014-07-08: 3 [IU] via SUBCUTANEOUS
  Administered 2014-07-08: 4 [IU] via SUBCUTANEOUS
  Administered 2014-07-09 (×2): 3 [IU] via SUBCUTANEOUS
  Administered 2014-07-09: 1 [IU] via SUBCUTANEOUS
  Administered 2014-07-10: 3 [IU] via SUBCUTANEOUS

## 2014-07-05 MED ORDER — DEXTROSE 5 % IV SOLN
1.0000 g | INTRAVENOUS | Status: DC
  Administered 2014-07-05 – 2014-07-08 (×4): 1 g via INTRAVENOUS
  Filled 2014-07-05 (×6): qty 10

## 2014-07-05 MED ORDER — SIMETHICONE 80 MG PO CHEW
80.0000 mg | CHEWABLE_TABLET | Freq: Three times a day (TID) | ORAL | Status: DC | PRN
  Filled 2014-07-05: qty 1

## 2014-07-05 MED ORDER — LEVETIRACETAM 500 MG PO TABS
500.0000 mg | ORAL_TABLET | Freq: Two times a day (BID) | ORAL | Status: DC
  Administered 2014-07-05 – 2014-07-10 (×11): 500 mg via ORAL
  Filled 2014-07-05 (×14): qty 1

## 2014-07-05 MED ORDER — CARVEDILOL 25 MG PO TABS
25.0000 mg | ORAL_TABLET | Freq: Two times a day (BID) | ORAL | Status: DC
  Administered 2014-07-05 – 2014-07-10 (×11): 25 mg via ORAL
  Filled 2014-07-05 (×14): qty 1

## 2014-07-05 MED ORDER — FUROSEMIDE 40 MG PO TABS
60.0000 mg | ORAL_TABLET | Freq: Every day | ORAL | Status: DC
  Administered 2014-07-05: 60 mg via ORAL
  Filled 2014-07-05 (×3): qty 1

## 2014-07-05 MED ORDER — FERROUS SULFATE 325 (65 FE) MG PO TABS
325.0000 mg | ORAL_TABLET | Freq: Every day | ORAL | Status: DC
  Administered 2014-07-05 – 2014-07-07 (×3): 325 mg via ORAL
  Filled 2014-07-05 (×6): qty 1

## 2014-07-05 MED ORDER — SODIUM CHLORIDE 0.9 % IJ SOLN: 3.0000 mL | INTRAMUSCULAR | Status: DC | PRN

## 2014-07-05 MED ORDER — SODIUM CHLORIDE 0.9 % IV SOLN: 250.0000 mL | INTRAVENOUS | Status: DC | PRN

## 2014-07-05 MED ORDER — SODIUM CHLORIDE 0.9 % IJ SOLN
3.0000 mL | Freq: Two times a day (BID) | INTRAMUSCULAR | Status: DC
  Administered 2014-07-05 – 2014-07-09 (×9): 3 mL via INTRAVENOUS

## 2014-07-05 MED ORDER — HEPARIN SODIUM (PORCINE) 5000 UNIT/ML IJ SOLN
5000.0000 [IU] | Freq: Three times a day (TID) | INTRAMUSCULAR | Status: DC
  Administered 2014-07-05 – 2014-07-10 (×15): 5000 [IU] via SUBCUTANEOUS
  Filled 2014-07-05 (×17): qty 1

## 2014-07-05 NOTE — Progress Notes (Signed)
Pt admitted to room 6E 15 from ED via stretcher.  Pleasant. Alert and Oriented. Ambulates without difficulty.

## 2014-07-05 NOTE — ED Notes (Signed)
Report given to 6 E RN Juliann Pulse

## 2014-07-05 NOTE — Progress Notes (Signed)
*  PRELIMINARY RESULTS* Vascular Ultrasound Lower extremity venous duplex has been completed.  Preliminary findings: no evidence of DVT.  Landry Mellow, RDMS, RVT  07/05/2014, 8:24 AM

## 2014-07-05 NOTE — Progress Notes (Signed)
Dr. Olevia Bowens returned call T/O dc IV fluids.

## 2014-07-05 NOTE — ED Provider Notes (Signed)
CSN: [id number]     Arrival date & time 07/05/14  [id number] History   First MD Initiated Contact with Patient 07/05/14 0725     Chief Complaint  Patient presents with  . Cellulitis     (Consider location/radiation/quality/duration/timing/severity/associated sxs/prior Treatment) HPI  Patient reports for the past week she's had increased swelling of both her legs however the right is worse than the left. She states she started getting localized swelling in her right proximal calf and came to the ED 2 days ago and had a Doppler ultrasound which showed no abscess but a cellulitis. She has been on Bactrim for 2 days and reports she's not improving. She has no documented fever but states she has chills off and on. She states her left leg swelling seems better. The right however is still swollen and painful. Patient denies chest pain. She does report dyspnea on exertion for a while. Patient did have amputation of her distal left foot and reports that makes it harder to ambulate. Patient has had diabetes for 23 years which she states was controlled well until the last 4 years. Patient reports she had a abscess in her axilla once before otherwise no history of boils or abscesses.   PCP Dr Justin Mend at [place]  Past Medical History  Diagnosis Date  . Neuropathy   . Glaucoma   . Bipolar 1 disorder   . CHF (congestive heart failure)   . Refusal of blood transfusions as patient is Jehovah's Witness   . Hypertension   . Shortness of breath   . TIA (transient ischemic attack) 2010  . Neuromuscular disorder     DIABETIC NEUROPATHY  . MRSA infection ?2014    "on the back of my head; spread throughout my bloodstream"  . Bipolar disorder, unspecified 02/06/2014  . High cholesterol   . Type II diabetes mellitus     INSULIN DEPENDENT  . Hepatitis C   . Seizure dx'd 2014    "don't know what kind; last one was ~ 04/2014"  . Chronic pain syndrome   . Chronic kidney disease (CKD), stage III (moderate)   .  Schizoaffective disorder, unspecified condition 02/08/2014   Past Surgical History  Procedure Laterality Date  . Amputation Left 05/21/2013    Procedure: Left Midfoot AMPUTATION;  Surgeon: Newt Minion, MD;  Location: [facility];  Service: Orthopedics;  Laterality: Left;  Left Midfoot Amputation  . Tubal ligation    . Colonoscopy with propofol N/A 06/22/2013    Procedure: COLONOSCOPY WITH PROPOFOL;  Surgeon: Jeryl Columbia, MD;  Location: [facility];  Service: Endoscopy;  Laterality: N/A;  . Abdominal hysterectomy  2014  . Tonsillectomy  1970's  . Refractive surgery Bilateral 2013   Family History  Problem Relation Age of Onset  . Hypertension Mother   . Hypertension Father    History  Substance Use Topics  . Smoking status: Former Smoker -- 0.25 packs/day for 30 years    Types: Cigarettes    Quit date: 06/17/2014  . Smokeless tobacco: Never Used     Comment: "stopped smoking in ~ 2013; restarted last week; stopped again 06/17/2014"  . Alcohol Use: 18.0 oz/week    30 Cans of beer per week     Comment: 06/22/2014 "2-3, 40oz beers; 3 days/wk"    Denies smoking Denies ETOH Does not use a cane to assist walking  OB History    No data available     Review of Systems  All other systems reviewed and  are negative.     Allergies  Gabapentin; Saphris; Tramadol; and Penicillins  Home Medications   Prior to Admission medications   Medication Sig Start Date End Date Taking? Authorizing Provider  carvedilol (COREG) 25 MG tablet Take 1 tablet (25 mg total) by mouth 2 (two) times daily. 06/24/14  Yes Debbe Odea, MD  DULoxetine (CYMBALTA) 30 MG capsule Take 1 capsule (30 mg total) by mouth daily. 06/25/14  Yes Debbe Odea, MD  ferrous sulfate 325 (65 FE) MG tablet Take 1 tablet (325 mg total) by mouth daily with breakfast. 06/24/14  Yes Debbe Odea, MD  folic acid (FOLVITE) A999333 MCG tablet Take 400 mcg by mouth every morning.   Yes Historical Provider, MD  furosemide (LASIX) 20 MG  tablet Take 3 tablets (60 mg total) by mouth every morning. Patient taking differently: Take 80 mg by mouth every morning.  04/25/14  Yes Theodis Blaze, MD  insulin aspart (NOVOLOG) 100 UNIT/ML injection Inject 2-10 Units into the skin 3 (three) times daily before meals. SSI: 0-200 = 2 units, 201-250 = 4 units, 251-300 = 6 units, 301-350 = 8 units, 351-400 = 10 units   Yes Historical Provider, MD  insulin glargine (LANTUS) 100 UNIT/ML injection Inject 0.2 mLs (20 Units total) into the skin at bedtime. 06/25/14  Yes Debbe Odea, MD  levETIRAcetam (KEPPRA) 500 MG tablet Take 1 tablet (500 mg total) by mouth 2 (two) times daily. 06/24/14  Yes Debbe Odea, MD  Multiple Vitamins-Minerals (SENIOR MULTIVITAMIN PLUS PO) Take 1 tablet by mouth every morning.   Yes Historical Provider, MD  oxyCODONE-acetaminophen (PERCOCET/ROXICET) 5-325 MG per tablet Take 1 tablet by mouth every 6 (six) hours as needed for moderate pain. Patient taking differently: Take 2 tablets by mouth every 6 (six) hours as needed for moderate pain.  06/24/14  Yes Debbe Odea, MD  polyethylene glycol (MIRALAX / GLYCOLAX) packet Take 17 g by mouth 2 (two) times daily.   Yes Historical Provider, MD  silver sulfADIAZINE (SILVADENE) 1 % cream Apply topically daily. 04/14/14  Yes Nishant Dhungel, MD  sulfamethoxazole-trimethoprim (SEPTRA DS) 800-160 MG per tablet Take 1 tablet by mouth every 12 (twelve) hours. 07/03/14  Yes Hoy Morn, MD  cloNIDine (CATAPRES) 0.1 MG tablet Take 1 tablet (0.1 mg total) by mouth 3 (three) times daily as needed (for hypertension). 06/24/14   Debbe Odea, MD  Elastic Bandages & Supports (T.E.D. KNEE LENGTH/S-REGULAR) MISC 1 each by Does not apply route daily. Patient not taking: Reported on 07/05/2014 06/25/14   Debbe Odea, MD  simethicone (MYLICON) 80 MG chewable tablet Chew 80 mg by mouth every 8 (eight) hours as needed for flatulence.    Historical Provider, MD   BP 180/89 mmHg  Pulse 87  Resp 28   SpO2 100%  Vital signs normal except hypertension  Physical Exam  Constitutional: She is oriented to person, place, and time. She appears well-developed and well-nourished.  Non-toxic appearance. She does not appear ill. No distress.  HENT:  Head: Normocephalic and atraumatic.  Right Ear: External ear normal.  Left Ear: External ear normal.  Nose: Nose normal. No mucosal edema or rhinorrhea.  Mouth/Throat: Mucous membranes are normal. No dental abscesses or uvula swelling.  Eyes: Conjunctivae and EOM are normal. Pupils are equal, round, and reactive to light.  Neck: Normal range of motion and full passive range of motion without pain. Neck supple.  Cardiovascular:  No murmur heard. Pulmonary/Chest: Effort normal. No respiratory distress. She has no rhonchi. She exhibits  no crepitus.  Abdominal: Normal appearance.  Musculoskeletal: She exhibits edema and tenderness.  Moves all extremities well. Patient is noted to have amputation of her distal mid left foot. Her surgical wound was inspected and is without infection. She has some mild diffuse swelling of her left lower leg with some hyperpigmented scars from prior given problems. She has swelling of her distal right leg that appears less swollen than her left leg, however she has some indurated firm feeling skin of her proximal calf with orange peel changes and some redness of the skin consistent with cellulitis. I am unable to tell if there is an abscess present. Please see photos  Neurological: She is alert and oriented to person, place, and time. She has normal strength. No cranial nerve deficit.  Skin: Skin is warm, dry and intact. No rash noted. No erythema. No pallor.  Psychiatric: She has a normal mood and affect. Her speech is normal and behavior is normal. Her mood appears not anxious.  Nursing note and vitals reviewed.       ED Course  Procedures (including critical care time) Medications  0.9 %  sodium chloride infusion (  Intravenous New Bag/Given 07/05/14 0804)  vancomycin (VANCOCIN) IVPB 750 mg/150 ml premix (750 mg Intravenous Given 07/05/14 1012)   Patient was started on IV vancomycin with pharmacy consult due to her underlying renal insufficiency.  10:17 Dr Venetia Constable, admit to med-surg   Author: Chapman Fitch Service: Vascular Lab Author Type: Cardiovascular Sonographer    Filed: 07/05/2014 8:24 AM Note Time: 07/05/2014 8:24 AM Status: Signed   Editor: Chapman Fitch (Cardiovascular Sonographer)     Expand All Collapse All   *PRELIMINARY RESULTS* Vascular Ultrasound Lower extremity venous duplex has been completed. Preliminary findings: no evidence of DVT.  Landry Mellow, RDMS, RVT  07/05/2014, 8:24 AM        Labs Review Results for orders placed or performed during the hospital encounter of 07/05/14  Comprehensive metabolic panel  Result Value Ref Range   Sodium 135 (L) 137 - 147 mEq/L   Potassium 4.2 3.7 - 5.3 mEq/L   Chloride 101 96 - 112 mEq/L   CO2 20 19 - 32 mEq/L   Glucose, Bld 146 (H) 70 - 99 mg/dL   BUN 46 (H) 6 - 23 mg/dL   Creatinine, Ser 3.47 (H) 0.50 - 1.10 mg/dL   Calcium 8.1 (L) 8.4 - 10.5 mg/dL   Total Protein 6.3 6.0 - 8.3 g/dL   Albumin 1.7 (L) 3.5 - 5.2 g/dL   AST 28 0 - 37 U/L   ALT 21 0 - 35 U/L   Alkaline Phosphatase 84 39 - 117 U/L   Total Bilirubin <0.2 (L) 0.3 - 1.2 mg/dL   GFR calc non Af Amer 15 (L) >90 mL/min   GFR calc Af Amer 17 (L) >90 mL/min   Anion gap 14 5 - 15  CBC with Differential  Result Value Ref Range   WBC 3.5 (L) 4.0 - 10.5 K/uL   RBC 3.08 (L) 3.87 - 5.11 MIL/uL   Hemoglobin 8.2 (L) 12.0 - 15.0 g/dL   HCT 25.6 (L) 36.0 - 46.0 %   MCV 83.1 78.0 - 100.0 fL   MCH 26.6 26.0 - 34.0 pg   MCHC 32.0 30.0 - 36.0 g/dL   RDW 15.0 11.5 - 15.5 %   Platelets 218 150 - 400 K/uL   Neutrophils Relative % 45 43 - 77 %   Neutro Abs 1.6 (L) 1.7 -  7.7 K/uL   Lymphocytes Relative 43 12 - 46 %   Lymphs Abs 1.5 0.7 - 4.0 K/uL   Monocytes Relative 8 3 - 12  %   Monocytes Absolute 0.3 0.1 - 1.0 K/uL   Eosinophils Relative 3 0 - 5 %   Eosinophils Absolute 0.1 0.0 - 0.7 K/uL   Basophils Relative 1 0 - 1 %   Basophils Absolute 0.0 0.0 - 0.1 K/uL    Laboratory interpretation all normal except anemia, renal insufficiency, both stable    Imaging Review No results found.   EKG Interpretation None      MDM   patient has long history of diabetes complicated by amputation of her distal left mid foot with cellulitis that is not improving on outpatient antibiotics orally for the past 2 days.    Final diagnoses:  Cellulitis of right lower extremity without foot  Chronic renal insufficiency, unspecified stage    Plan admission  Rolland Porter, MD, Alanson Aly, MD 07/05/14 1021

## 2014-07-05 NOTE — H&P (Signed)
[facility] History and Physical  [name] [id number] DOB: July 02, 1966 DOA: 07/05/2014  Referring physician: Dr. Tomi Bamberger PCP: Maurice Small, D, MD   Chief Complaint: right leg pain  HPI: [name] is a 48 y.o. female past medical history of chronic diastolic heart failure, stage IV chronic kidney disease she doesn't want dialysis, hypertension that is seen diabetes type 2 that was recently [facility] for acute decompensated heart failure that comes in for right lower extremity tenderness and knot in the back of her legs. She relates this started 2 days prior to admission she came to the ED Dopplers were done which showed no DVT she was started on Bactrim but she relates this progressively got worse. She denies any fever or chills, nausea .   In the ED: Doppler of the lower extremities was repeated she was started on bank   Review of Systems:  Constitutional:  No weight loss, night sweats, Fevers, chills, fatigue.  HEENT:  No headaches, Difficulty swallowing,Tooth/dental problems,Sore throat,  No sneezing, itching, ear ache, nasal congestion, post nasal drip,  Cardio-vascular:  No chest pain, Orthopnea, PND, swelling in lower extremities, anasarca, dizziness, palpitations  GI:  No heartburn, indigestion, abdominal pain, nausea, vomiting, diarrhea, change in bowel habits, loss of appetite  Resp:  No shortness of breath with exertion or at rest. No excess mucus, no productive cough, No non-productive cough, No coughing up of blood.No change in color of mucus.No wheezing.No chest wall deformity  Skin:  no rash or lesions.  GU:  no dysuria, change in color of urine, no urgency or frequency. No flank pain.  Musculoskeletal:  No joint pain or swelling. No decreased range of motion. No back pain.  Psych:  No change in mood or affect. No depression or anxiety. No memory loss.   Past Medical History  Diagnosis Date  . Neuropathy   . Glaucoma   . Bipolar 1 disorder   . CHF  (congestive heart failure)   . Refusal of blood transfusions as patient is Jehovah's Witness   . Hypertension   . Shortness of breath   . TIA (transient ischemic attack) 2010  . Neuromuscular disorder     DIABETIC NEUROPATHY  . MRSA infection ?2014    "on the back of my head; spread throughout my bloodstream"  . Bipolar disorder, unspecified 02/06/2014  . High cholesterol   . Type II diabetes mellitus     INSULIN DEPENDENT  . Hepatitis C   . Seizure dx'd 2014    "don't know what kind; last one was ~ 04/2014"  . Chronic pain syndrome   . Chronic kidney disease (CKD), stage III (moderate)   . Schizoaffective disorder, unspecified condition 02/08/2014   Past Surgical History  Procedure Laterality Date  . Amputation Left 05/21/2013    Procedure: Left Midfoot AMPUTATION;  Surgeon: Newt Minion, MD;  Location: [place];  Service: Orthopedics;  Laterality: Left;  Left Midfoot Amputation  . Tubal ligation    . Colonoscopy with propofol N/A 06/22/2013    Procedure: COLONOSCOPY WITH PROPOFOL;  Surgeon: Jeryl Columbia, MD;  Location: [facility];  Service: Endoscopy;  Laterality: N/A;  . Abdominal hysterectomy  2014  . Tonsillectomy  1970's  . Refractive surgery Bilateral 2013   Social History:  reports that she quit smoking about 2 weeks ago. Her smoking use included Cigarettes. She has a 7.5 pack-year smoking history. She has never used smokeless tobacco. She reports that she drinks about 18.0 oz of alcohol per  week. She reports that she uses illicit drugs ("Crack" cocaine).  Allergies  Allergen Reactions  . Gabapentin Itching and Swelling  . Saphris [Asenapine] Swelling    Swelling of the face  . Tramadol Swelling    Causes legs to swell  . Penicillins Hives    Has tolerated Cefepime and Ceftriaxone.    Family History  Problem Relation Age of Onset  . Hypertension Mother   . Hypertension Father      Prior to Admission medications   Medication Sig Start Date End Date Taking?  Authorizing Provider  carvedilol (COREG) 25 MG tablet Take 1 tablet (25 mg total) by mouth 2 (two) times daily. 06/24/14  Yes Debbe Odea, MD  DULoxetine (CYMBALTA) 30 MG capsule Take 1 capsule (30 mg total) by mouth daily. 06/25/14  Yes Debbe Odea, MD  ferrous sulfate 325 (65 FE) MG tablet Take 1 tablet (325 mg total) by mouth daily with breakfast. 06/24/14  Yes Debbe Odea, MD  folic acid (FOLVITE) A999333 MCG tablet Take 400 mcg by mouth every morning.   Yes Historical Provider, MD  furosemide (LASIX) 20 MG tablet Take 3 tablets (60 mg total) by mouth every morning. Patient taking differently: Take 80 mg by mouth every morning.  04/25/14  Yes Theodis Blaze, MD  insulin aspart (NOVOLOG) 100 UNIT/ML injection Inject 2-10 Units into the skin 3 (three) times daily before meals. SSI: 0-200 = 2 units, 201-250 = 4 units, 251-300 = 6 units, 301-350 = 8 units, 351-400 = 10 units   Yes Historical Provider, MD  insulin glargine (LANTUS) 100 UNIT/ML injection Inject 0.2 mLs (20 Units total) into the skin at bedtime. 06/25/14  Yes Debbe Odea, MD  levETIRAcetam (KEPPRA) 500 MG tablet Take 1 tablet (500 mg total) by mouth 2 (two) times daily. 06/24/14  Yes Debbe Odea, MD  Multiple Vitamins-Minerals (SENIOR MULTIVITAMIN PLUS PO) Take 1 tablet by mouth every morning.   Yes Historical Provider, MD  oxyCODONE-acetaminophen (PERCOCET/ROXICET) 5-325 MG per tablet Take 1 tablet by mouth every 6 (six) hours as needed for moderate pain. Patient taking differently: Take 2 tablets by mouth every 6 (six) hours as needed for moderate pain.  06/24/14  Yes Debbe Odea, MD  polyethylene glycol (MIRALAX / GLYCOLAX) packet Take 17 g by mouth 2 (two) times daily.   Yes Historical Provider, MD  silver sulfADIAZINE (SILVADENE) 1 % cream Apply topically daily. 04/14/14  Yes Nishant Dhungel, MD  sulfamethoxazole-trimethoprim (SEPTRA DS) 800-160 MG per tablet Take 1 tablet by mouth every 12 (twelve) hours. 07/03/14  Yes Hoy Morn, MD  cloNIDine (CATAPRES) 0.1 MG tablet Take 1 tablet (0.1 mg total) by mouth 3 (three) times daily as needed (for hypertension). 06/24/14   Debbe Odea, MD  Elastic Bandages & Supports (T.E.D. KNEE LENGTH/S-REGULAR) MISC 1 each by Does not apply route daily. Patient not taking: Reported on 07/05/2014 06/25/14   Debbe Odea, MD  simethicone (MYLICON) 80 MG chewable tablet Chew 80 mg by mouth every 8 (eight) hours as needed for flatulence.    Historical Provider, MD   Physical Exam: Filed Vitals:   07/05/14 0930 07/05/14 1015 07/05/14 1115 07/05/14 1200  BP: 185/97 184/101 174/91 191/102  Pulse: 88 86 86 91  Temp:    97.7 F (36.5 C)  TempSrc:    Oral  Resp: 28 26 22 20   Height:    5\' 4"  (1.626 m)  Weight:    87.5 kg (192 lb 14.4 oz)  SpO2: 100% 100% 99%  100%    Wt Readings from Last 3 Encounters:  07/05/14 87.5 kg (192 lb 14.4 oz)  07/03/14 92.08 kg (203 lb)  06/25/14 91.808 kg (202 lb 6.4 oz)    General:  Appears calm and comfortable Eyes: PERRL, normal lids, irises & conjunctiva ENT: grossly normal hearing, lips & tongue Neck: no LAD, masses or thyromegaly Cardiovascular: RRR, no m/r/g. No LE edema. Telemetry: SR, no arrhythmias  Respiratory: CTA bilaterally, no w/r/r. Normal respiratory effort. Abdomen: soft, ntnd Skin:Is warm to touch with mild tenderness  Musculoskeletal:She has a stump on her left neck which is clean, she has 2 areas and didn't duration on her calf, 3+ edema lower extremities.  Psychiatric: grossly normal mood and affect, speech fluent and appropriate Neurologic: grossly non-focal.          Labs on Admission:  Basic Metabolic Panel:  Recent Labs Lab 07/05/14 0801  NA 135*  K 4.2  CL 101  CO2 20  GLUCOSE 146*  BUN 46*  CREATININE 3.47*  CALCIUM 8.1*   Liver Function Tests:  Recent Labs Lab 07/05/14 0801  AST 28  ALT 21  ALKPHOS 84  BILITOT <0.2*  PROT 6.3  ALBUMIN 1.7*   No results for input(s): LIPASE, AMYLASE in the  last 168 hours. No results for input(s): AMMONIA in the last 168 hours. CBC:  Recent Labs Lab 07/05/14 0801  WBC 3.5*  NEUTROABS 1.6*  HGB 8.2*  HCT 25.6*  MCV 83.1  PLT 218   Cardiac Enzymes: No results for input(s): CKTOTAL, CKMB, CKMBINDEX, TROPONINI in the last 168 hours.  BNP (last 3 results)  Recent Labs  02/23/14 0224 04/12/14 0520 06/22/14 1532  PROBNP 2305.0* 12801.0* 28725.0*   CBG: No results for input(s): GLUCAP in the last 168 hours.  Radiological Exams on Admission: No results found.  EKG: Independently reviewed.   Assessment/Plan Cellulitis of right lower extremity without foot: - Doppler of the lower extremity was done that shows no abscess, No evidence of deep vein thrombosis involving the right lowerextremity. No evidence of deep vein thrombosis involving the left lower extremity. - Check blood cultures 2 started on vancomycin and Rocephin.   Stage 4 chronic kidney disease due to type 2 diabetes mellitus/ Chronic diastolic heart failure: - She relates she doesn't want dialysis, her echo was done that shows diastolic heart failure with a pulmonary artery pressure of 35 mmHg. - I'll go ahead and start her on IV Lasix 80 mg every 6 check a basic metabolic panel in the morning to monitor creatinine.  DM type 2 causing CKD stage 4 - Check a hemoglobin A1c continue Lantus with sliding scale insulin.  Mild chronic leukopenia: - Stable.  Anemia of chronic disease/Refusal of blood transfusions as patient is Jehovah's Witness: - Continue to monitor anemia she is currently asymptomatic.    history of seizure disorder: Continue Keppra.   Code Status: full DVT Prophylaxis: heparin Family Communication: none Disposition Plan: inpatient  Time spent: 56 minutes  Charlynne Cousins [facility] Pager [phone]

## 2014-07-05 NOTE — ED Notes (Signed)
MD at bedside discussing plan of care.

## 2014-07-05 NOTE — ED Notes (Signed)
Pt was here yesterday seen by Dr. Venora Maples; diagnosed with cellulitis in both lower extremities; given bactrim to go home with; told to come back today for a re-check, may need IV antibiotics. Dr. Tomi Bamberger at bedside. Both lower extremities red, swollen, hard, and warm to touch. Pt states she "sometimes has fevers"

## 2014-07-05 NOTE — Progress Notes (Signed)
Paged Dr. Olevia Bowens , pt admitted to room [phone] from ED.  Pt BP 191/102 and right leg pain 6/10, needs something for BP and pain.

## 2014-07-05 NOTE — Progress Notes (Signed)
ANTIBIOTIC CONSULT NOTE - INITIAL  Pharmacy Consult for Vancomycin Indication: Cellulitis   Allergies  Allergen Reactions  . Gabapentin Itching and Swelling  . Saphris [Asenapine] Swelling    Swelling of the face  . Tramadol Swelling    Causes legs to swell  . Penicillins Hives    Has tolerated Cefepime and Ceftriaxone.    Patient Measurements:   Adjusted Body Weight: n/a  Vital Signs: BP: 180/89 mmHg (12/01 0745) Pulse Rate: 87 (12/01 0745) Intake/Output from previous day:   Intake/Output from this shift:    Labs:  Recent Labs  07/05/14 0801  WBC 3.5*  HGB 8.2*  PLT 218  CREATININE 3.47*   Estimated Creatinine Clearance: 21.8 mL/min (by C-G formula based on Cr of 3.47). No results for input(s): VANCOTROUGH, VANCOPEAK, VANCORANDOM, GENTTROUGH, GENTPEAK, GENTRANDOM, TOBRATROUGH, TOBRAPEAK, TOBRARND, AMIKACINPEAK, AMIKACINTROU, AMIKACIN in the last 72 hours.   Microbiology: Recent Results (from the past 720 hour(s))  MRSA PCR Screening     Status: None   Collection Time: 06/22/14  9:51 PM  Result Value Ref Range Status   MRSA by PCR NEGATIVE NEGATIVE Final    Comment:        The GeneXpert MRSA Assay (FDA approved for NASAL specimens only), is one component of a comprehensive MRSA colonization surveillance program. It is not intended to diagnose MRSA infection nor to guide or monitor treatment for MRSA infections.     Medical History: Past Medical History  Diagnosis Date  . Neuropathy   . Glaucoma   . Bipolar 1 disorder   . CHF (congestive heart failure)   . Refusal of blood transfusions as patient is Jehovah's Witness   . Hypertension   . Shortness of breath   . TIA (transient ischemic attack) 2010  . Neuromuscular disorder     DIABETIC NEUROPATHY  . MRSA infection ?2014    "on the back of my head; spread throughout my bloodstream"  . Bipolar disorder, unspecified 02/06/2014  . High cholesterol   . Type II diabetes mellitus     INSULIN  DEPENDENT  . Hepatitis C   . Seizure dx'd 2014    "don't know what kind; last one was ~ 04/2014"  . Chronic pain syndrome   . Chronic kidney disease (CKD), stage III (moderate)   . Schizoaffective disorder, unspecified condition 02/08/2014    Medications:   (Not in a hospital admission) Assessment: 22 YOF with presented with worsening lower extremity edema. Pharmacy consulted to start Vancomycin for abscess in her axilla as well calf redness consistent with cellulitis. Pt has a h/o CKD with a SCr of 3.47 (BL ~ 2.2). WBC is low at 3.5 with no fevers.   Goal of Therapy:  Vancomycin trough level 10-15 mcg/ml  Plan:  -Start Vancomycin 750 mg IV Q 24 hours -Monitor CBC, renal fx and patient's clinical progress -[facility] at [place], PharmD., BCPS Clinical Pharmacist Pager [phone]

## 2014-07-05 NOTE — ED Notes (Signed)
Attempted to give report to [facility]. Will call back in "a few minutes."

## 2014-07-06 ENCOUNTER — Inpatient Hospital Stay (HOSPITAL_COMMUNITY): Payer: Medicare Other

## 2014-07-06 DIAGNOSIS — Z531 Procedure and treatment not carried out because of patient's decision for reasons of belief and group pressure: Secondary | ICD-10-CM

## 2014-07-06 LAB — IRON AND TIBC
IRON: 52 ug/dL (ref 42–135)
SATURATION RATIOS: 22 % (ref 20–55)
TIBC: 237 ug/dL — ABNORMAL LOW (ref 250–470)
UIBC: 185 ug/dL (ref 125–400)

## 2014-07-06 LAB — CBC
HCT: 23.2 % — ABNORMAL LOW (ref 36.0–46.0)
HEMOGLOBIN: 7.6 g/dL — AB (ref 12.0–15.0)
MCH: 28 pg (ref 26.0–34.0)
MCHC: 32.8 g/dL (ref 30.0–36.0)
MCV: 85.6 fL (ref 78.0–100.0)
PLATELETS: 192 10*3/uL (ref 150–400)
RBC: 2.71 MIL/uL — ABNORMAL LOW (ref 3.87–5.11)
RDW: 15.1 % (ref 11.5–15.5)
WBC: 2.4 10*3/uL — ABNORMAL LOW (ref 4.0–10.5)

## 2014-07-06 LAB — COMPREHENSIVE METABOLIC PANEL
ALT: 20 U/L (ref 0–35)
AST: 27 U/L (ref 0–37)
Albumin: 1.6 g/dL — ABNORMAL LOW (ref 3.5–5.2)
Alkaline Phosphatase: 69 U/L (ref 39–117)
Anion gap: 15 (ref 5–15)
BUN: 46 mg/dL — ABNORMAL HIGH (ref 6–23)
CALCIUM: 7.9 mg/dL — AB (ref 8.4–10.5)
CO2: 18 mEq/L — ABNORMAL LOW (ref 19–32)
Chloride: 102 mEq/L (ref 96–112)
Creatinine, Ser: 3.8 mg/dL — ABNORMAL HIGH (ref 0.50–1.10)
GFR, EST AFRICAN AMERICAN: 15 mL/min — AB (ref >90)
GFR, EST NON AFRICAN AMERICAN: 13 mL/min — AB (ref >90)
Glucose, Bld: 221 mg/dL — ABNORMAL HIGH (ref 70–99)
Potassium: 4.2 mEq/L (ref 3.7–5.3)
Sodium: 135 mEq/L — ABNORMAL LOW (ref 137–147)
TOTAL PROTEIN: 6 g/dL (ref 6.0–8.3)
Total Bilirubin: <0.2 mg/dL — ABNORMAL LOW (ref 0.3–1.2)

## 2014-07-06 LAB — URINALYSIS, ROUTINE W REFLEX MICROSCOPIC
BILIRUBIN URINE: NEGATIVE
Glucose, UA: 100 mg/dL — AB
KETONES UR: NEGATIVE mg/dL
LEUKOCYTES UA: NEGATIVE
NITRITE: NEGATIVE
Protein, ur: >300 mg/dL — AB
Specific Gravity, Urine: 1.012 (ref 1.005–1.030)
Urobilinogen, UA: 0.2 mg/dL (ref 0.0–1.0)
pH: 7 (ref 5.0–8.0)

## 2014-07-06 LAB — FERRITIN: Ferritin: 111 ng/mL (ref 10–291)

## 2014-07-06 LAB — GLUCOSE, CAPILLARY
GLUCOSE-CAPILLARY: 210 mg/dL — AB (ref 70–99)
GLUCOSE-CAPILLARY: 142 mg/dL — AB (ref 70–99)
Glucose-Capillary: 226 mg/dL — ABNORMAL HIGH (ref 70–99)
Glucose-Capillary: 177 mg/dL — ABNORMAL HIGH (ref 70–99)

## 2014-07-06 LAB — URINE MICROSCOPIC-ADD ON

## 2014-07-06 LAB — FOLATE: Folate: 16 ng/mL

## 2014-07-06 LAB — OSMOLALITY: Osmolality: 303 mOsm/kg — ABNORMAL HIGH (ref 275–300)

## 2014-07-06 LAB — SODIUM, URINE, RANDOM: Sodium, Ur: 96 mEq/L

## 2014-07-06 LAB — RETICULOCYTES
RBC.: 2.74 MIL/uL — ABNORMAL LOW (ref 3.87–5.11)
RETIC COUNT ABSOLUTE: 43.8 10*3/uL (ref 19.0–186.0)
Retic Ct Pct: 1.6 % (ref 0.4–3.1)

## 2014-07-06 LAB — HEMOGLOBIN A1C
HEMOGLOBIN A1C: 8.3 % — AB (ref <5.7)
Mean Plasma Glucose: 192 mg/dL — ABNORMAL HIGH (ref <117)

## 2014-07-06 LAB — CREATININE, URINE, RANDOM: Creatinine, Urine: 33.19 mg/dL

## 2014-07-06 LAB — VITAMIN B12: Vitamin B-12: 696 pg/mL (ref 211–911)

## 2014-07-06 MED ORDER — FUROSEMIDE 10 MG/ML IJ SOLN
60.0000 mg | Freq: Two times a day (BID) | INTRAMUSCULAR | Status: DC
  Administered 2014-07-06: 60 mg via INTRAVENOUS
  Filled 2014-07-06 (×3): qty 6

## 2014-07-06 MED ORDER — INSULIN GLARGINE 100 UNIT/ML ~~LOC~~ SOLN
30.0000 [IU] | Freq: Every day | SUBCUTANEOUS | Status: DC
  Filled 2014-07-06 (×4): qty 0.3

## 2014-07-06 MED ORDER — INSULIN ASPART 100 UNIT/ML ~~LOC~~ SOLN
3.0000 [IU] | Freq: Three times a day (TID) | SUBCUTANEOUS | Status: DC
  Administered 2014-07-06 – 2014-07-10 (×12): 3 [IU] via SUBCUTANEOUS

## 2014-07-06 MED ORDER — MUPIROCIN CALCIUM 2 % EX CREA
TOPICAL_CREAM | Freq: Two times a day (BID) | CUTANEOUS | Status: DC
  Administered 2014-07-06 – 2014-07-09 (×7): via TOPICAL
  Filled 2014-07-06 (×2): qty 15

## 2014-07-06 MED ORDER — HYDRALAZINE HCL 25 MG PO TABS
25.0000 mg | ORAL_TABLET | Freq: Three times a day (TID) | ORAL | Status: DC
  Administered 2014-07-06 – 2014-07-10 (×11): 25 mg via ORAL
  Filled 2014-07-06 (×16): qty 1

## 2014-07-06 NOTE — Progress Notes (Signed)
Patient refused to have bed alarm be activated.Patient advised to call for assistance  or before getting up to go to bathroom.Call [facility] within reach. Dellas Guard, Wonda Cheng, Therapist, sports

## 2014-07-06 NOTE — Progress Notes (Signed)
Patient Demographics  [name], is a 48 y.o. female, DOB - [birth year], [id number]  Admit date - 07/05/2014   Admitting Physician Charlynne Cousins, MD  Outpatient Primary MD for the patient is [name], MD  LOS - 1   Chief Complaint  Patient presents with  . Cellulitis        Subjective:   [name] today has, No headache, No chest pain, No abdominal pain - No Nausea, No new weakness tingling or numbness, No Cough - SOB.   Assessment & Plan    1.R Leg pain - there is mild cellulitis and 2+ edema, also a soft tissue swelling in the right calf area, venous ultrasound unremarkable, on empiric antibiotic and IV diuretic, check right calf soft tissue ultrasound to rule out an abscess.    2. ARF on Stage 4 chronic kidney disease due to type 2 diabetes mellitus - baseline creatinine around 3.2 to 3, currently in acute renal failure likely due to fluid overload, check urine electrolytes, renal ultrasound, continue IV diuretics at a lower dose, monitor intake and output, daily weight, monitor BMP closely. Avoid nephrotoxins.   3. Anemia of chronic disease with Refusal of blood transfusions as patient is Jehovah's Witness - check anemia panel. May require iron transfusion.   4. 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 In poor control, on Lantus and sliding scale will adjust dose for better control.  Lab Results  Component Value Date   HGBA1C 8.3* 07/05/2014    CBG (last 3)   Recent Labs  07/05/14 2111 07/06/14 0754  GLUCAP 264* 210*     5. Acute on chronic diastolic dysfunction. EF of 55% on recent echogram. Lasix dose has been adjusted from 80 mg IV every 6  to 60 mg IV twice a day, monitor BMP, continue Coreg.    6. History of seizures. On Keppra.    7. History of hep C.  Outpatient follow-up with PCP and ID/GI as needed.     8. Essential hypertension. On Coreg. Blood pressure still elevated, will add hydralazine for better control.     Code Status: Full  Family Communication: none present  Disposition Plan: Home   Procedures Leg Venous US -ve , Renal US   Consults     Medications  Scheduled Meds: . carvedilol  25 mg Oral BID  . cefTRIAXone (ROCEPHIN)  IV  1 g Intravenous Q24H  . DULoxetine  30 mg Oral Daily  . ferrous sulfate  325 mg Oral Q breakfast  . furosemide  60 mg Intravenous Q12H  . heparin  5,000 Units Subcutaneous 3 times per day  . insulin aspart  0-9 Units Subcutaneous TID WC  . insulin glargine  20 Units Subcutaneous QHS  . levETIRAcetam  500 mg Oral BID  . mupirocin cream   Topical BID  . polyethylene glycol  17 g Oral BID  . sodium chloride  3 mL Intravenous Q12H  . vancomycin  750 mg Intravenous Q24H   Continuous Infusions:  PRN Meds:.sodium chloride, morphine injection, ondansetron **OR** ondansetron (ZOFRAN) IV, oxyCODONE-acetaminophen, polyethylene glycol, sodium chloride  DVT Prophylaxis    Heparin   Lab Results  Component Value Date   PLT 192 07/06/2014  Antibiotics     Anti-infectives    Start     Dose/Rate Route Frequency Ordered Stop   07/05/14 1030  cefTRIAXone (ROCEPHIN) 1 g in dextrose 5 % 50 mL IVPB     1 g100 mL/hr over 30 Minutes Intravenous Every 24 hours 07/05/14 1017     07/05/14 1000  vancomycin (VANCOCIN) IVPB 750 mg/150 ml premix     750 mg150 mL/hr over 60 Minutes Intravenous Every 24 hours 07/05/14 0942            Objective:   Filed Vitals:   07/05/14 1751 07/05/14 2115 07/06/14 0449 07/06/14 1012  BP: 148/73 166/91 159/97 185/90  Pulse: 86 87 79 80  Temp: 98 F (36.7 C) 98.1 F (36.7 C) 97.6 F (36.4 C) 97.8 F (36.6 C)  TempSrc: Oral Oral Oral Oral  Resp: 22 20 18 18   Height:      Weight:      SpO2: 94% 98% 100% 100%    Wt Readings from Last 3 Encounters:    07/05/14 87.5 kg (192 lb 14.4 oz)  07/03/14 92.08 kg (203 lb)  06/25/14 91.808 kg (202 lb 6.4 oz)     Intake/Output Summary (Last 24 hours) at 07/06/14 1037 Last data filed at 07/06/14 0900  Gross per 24 hour  Intake    243 ml  Output    250 ml  Net     -7 ml     Physical Exam  Awake Alert, Oriented X 3, No new F.N deficits, Normal affect [place].AT,PERRAL Supple Neck,No JVD, No cervical lymphadenopathy appriciated.  Symmetrical Chest wall movement, Good air movement bilaterally, CTAB RRR,No Gallops,Rubs or new Murmurs, No Parasternal Heave +ve B.Sounds, Abd Soft, No tenderness, No organomegaly appriciated, No rebound - guarding or rigidity. No Cyanosis, Clubbing , 1-2+ leg edema R>L ,   L foot old partial amputation, chronic multiple small skin ulcer, R leg celllulits   Data Review   Micro Results No results found for this or any previous visit (from the past 240 hour(s)).  Radiology Reports Dg Chest 2 View  06/22/2014   CLINICAL DATA:  Shortness of breath, migraine for 4 days, no bowel movement  EXAM: CHEST  2 VIEW  COMPARISON:  04/27/2014  FINDINGS: There is bilateral diffuse interstitial thickening. There is left lower lobe airspace disease. There is no significant pleural effusion or pneumothorax. There is stable cardiomegaly. The osseous structures are unremarkable.  IMPRESSION: Bilateral diffuse interstitial thickening and cardiomegaly most concerning for mild pulmonary edema. There is hazy left lower lobe airspace disease which may reflect atelectasis versus developing pneumonia.   Electronically Signed   By: Kathreen Devoid   On: 06/22/2014 16:05     CBC  Recent Labs Lab 07/05/14 0801 07/06/14 0656  WBC 3.5* 2.4*  HGB 8.2* 7.6*  HCT 25.6* 23.2*  PLT 218 192  MCV 83.1 85.6  MCH 26.6 28.0  MCHC 32.0 32.8  RDW 15.0 15.1  LYMPHSABS 1.5  --   MONOABS 0.3  --   EOSABS 0.1  --   BASOSABS 0.0  --     Chemistries   Recent Labs Lab 07/05/14 0801 07/06/14 0656   NA 135* 135*  K 4.2 4.2  CL 101 102  CO2 20 18*  GLUCOSE 146* 221*  BUN 46* 46*  CREATININE 3.47* 3.80*  CALCIUM 8.1* 7.9*  AST 28 27  ALT 21 20  ALKPHOS 84 69  BILITOT <0.2* <0.2*   ------------------------------------------------------------------------------------------------------------------ estimated creatinine clearance is 19.4 mL/min (  by C-G formula based on Cr of 3.8). ------------------------------------------------------------------------------------------------------------------  Recent Labs  07/05/14 1857  HGBA1C 8.3*   ------------------------------------------------------------------------------------------------------------------ No results for input(s): CHOL, HDL, LDLCALC, TRIG, CHOLHDL, LDLDIRECT in the last 72 hours. ------------------------------------------------------------------------------------------------------------------ No results for input(s): TSH, T4TOTAL, T3FREE, THYROIDAB in the last 72 hours.  Invalid input(s): FREET3 ------------------------------------------------------------------------------------------------------------------ No results for input(s): VITAMINB12, FOLATE, FERRITIN, TIBC, IRON, RETICCTPCT in the last 72 hours.  Coagulation profile No results for input(s): INR, PROTIME in the last 168 hours.  No results for input(s): DDIMER in the last 72 hours.  Cardiac Enzymes No results for input(s): CKMB, TROPONINI, MYOGLOBIN in the last 168 hours.  Invalid input(s): CK ------------------------------------------------------------------------------------------------------------------ Invalid input(s): POCBNP     Time Spent in minutes  35   Sohrab Keelan K M.D on 07/06/2014 at 10:37 AM  Between 7am to 7pm - Pager - [phone]  After 7pm go to [email or web address] - password [email or web address]  And look for the night coverage person covering for me after hours  [facility] Office  [phone]

## 2014-07-07 ENCOUNTER — Inpatient Hospital Stay (HOSPITAL_COMMUNITY): Payer: Medicare Other

## 2014-07-07 LAB — BASIC METABOLIC PANEL
ANION GAP: 12 (ref 5–15)
BUN: 46 mg/dL — ABNORMAL HIGH (ref 6–23)
CALCIUM: 8 mg/dL — AB (ref 8.4–10.5)
CHLORIDE: 103 meq/L (ref 96–112)
CO2: 22 meq/L (ref 19–32)
Creatinine, Ser: 4.07 mg/dL — ABNORMAL HIGH (ref 0.50–1.10)
GFR calc Af Amer: 14 mL/min — ABNORMAL LOW (ref >90)
GFR calc non Af Amer: 12 mL/min — ABNORMAL LOW (ref >90)
Glucose, Bld: 190 mg/dL — ABNORMAL HIGH (ref 70–99)
Potassium: 4.6 mEq/L (ref 3.7–5.3)
SODIUM: 137 meq/L (ref 137–147)

## 2014-07-07 LAB — CBC
HEMATOCRIT: 22.2 % — AB (ref 36.0–46.0)
Hemoglobin: 7.1 g/dL — ABNORMAL LOW (ref 12.0–15.0)
MCH: 27.1 pg (ref 26.0–34.0)
MCHC: 32 g/dL (ref 30.0–36.0)
MCV: 84.7 fL (ref 78.0–100.0)
Platelets: 210 10*3/uL (ref 150–400)
RBC: 2.62 MIL/uL — AB (ref 3.87–5.11)
RDW: 15.2 % (ref 11.5–15.5)
WBC: 2.9 10*3/uL — AB (ref 4.0–10.5)

## 2014-07-07 LAB — GLUCOSE, CAPILLARY
GLUCOSE-CAPILLARY: 170 mg/dL — AB (ref 70–99)
GLUCOSE-CAPILLARY: 254 mg/dL — AB (ref 70–99)
GLUCOSE-CAPILLARY: 215 mg/dL — AB (ref 70–99)
GLUCOSE-CAPILLARY: 130 mg/dL — AB (ref 70–99)
Glucose-Capillary: 206 mg/dL — ABNORMAL HIGH (ref 70–99)

## 2014-07-07 LAB — PHOSPHORUS: Phosphorus: 5.3 mg/dL — ABNORMAL HIGH (ref 2.3–4.6)

## 2014-07-07 LAB — OSMOLALITY, URINE: Osmolality, Ur: 314 mOsm/kg — ABNORMAL LOW (ref 390–1090)

## 2014-07-07 MED ORDER — SODIUM CHLORIDE 0.9 % IV SOLN
510.0000 mg | Freq: Once | INTRAVENOUS | Status: AC
  Administered 2014-07-07: 510 mg via INTRAVENOUS
  Filled 2014-07-07: qty 17

## 2014-07-07 MED ORDER — DIPHENHYDRAMINE HCL 25 MG PO CAPS
25.0000 mg | ORAL_CAPSULE | Freq: Four times a day (QID) | ORAL | Status: DC | PRN
Start: 2014-07-07 — End: 2014-07-10
  Administered 2014-07-07 – 2014-07-09 (×4): 25 mg via ORAL
  Filled 2014-07-07 (×4): qty 1

## 2014-07-07 MED ORDER — DARBEPOETIN ALFA 100 MCG/0.5ML IJ SOSY
100.0000 ug | PREFILLED_SYRINGE | INTRAMUSCULAR | Status: DC
  Administered 2014-07-07: 100 ug via SUBCUTANEOUS
  Filled 2014-07-07 (×2): qty 0.5

## 2014-07-07 NOTE — Plan of Care (Signed)
Problem: Phase I Progression Outcomes Goal: Wound assessment- dressing change as appropriate Outcome: Completed/Met Date Met:  07/07/14

## 2014-07-07 NOTE — Consult Note (Signed)
[name] is an 48 y.o. female referred by Dr Candiss Norse   Chief Complaint: Acute on CKD, anemia HPI: 48yo BF admitted 07/05/14 for cellulits.  Has CKD 4 sec DM with baseline SCr ranging from 1.9 to mid 2's over the past 5 months.  Earlier this month Scr was mid 3's and on admission 3.47 and now 4.  Negative 2L yest.   Was placed on bactrim 3d ago.  Renal US shows increased echogenicity but no hydro.  DM x 0000000 complicated by retinopathy and neuropathy.  No Hx gross hematuria, stones or use of NSAID's.  Past Medical History  Diagnosis Date  . Neuropathy   . Glaucoma   . Bipolar 1 disorder   . CHF (congestive heart failure)   . Refusal of blood transfusions as patient is Jehovah's Witness   . Hypertension   . Shortness of breath   . TIA (transient ischemic attack) 2010  . Neuromuscular disorder     DIABETIC NEUROPATHY  . MRSA infection ?2014    "on the back of my head; spread throughout my bloodstream"  . Bipolar disorder, unspecified 02/06/2014  . High cholesterol   . Type II diabetes mellitus     INSULIN DEPENDENT  . Hepatitis C   . Seizure dx'd 2014    "don't know what kind; last one was ~ 04/2014"  . Chronic pain syndrome   . Chronic kidney disease (CKD), stage III (moderate)   . Schizoaffective disorder, unspecified condition 02/08/2014    Past Surgical History  Procedure Laterality Date  . Amputation Left 05/21/2013    Procedure: Left Midfoot AMPUTATION;  Surgeon: Newt Minion, MD;  Location: [facility];  Service: Orthopedics;  Laterality: Left;  Left Midfoot Amputation  . Tubal ligation    . Colonoscopy with propofol N/A 06/22/2013    Procedure: COLONOSCOPY WITH PROPOFOL;  Surgeon: Jeryl Columbia, MD;  Location: [facility];  Service: Endoscopy;  Laterality: N/A;  . Abdominal hysterectomy  2014  . Tonsillectomy  1970's  . Refractive surgery Bilateral 2013    Family History  Problem Relation Age of Onset  . Hypertension Mother   . Hypertension Father   FH:  Mother with ESRD sec  DM, Sister with ESRD sec DM  Social History:  reports that she quit smoking about 2 weeks ago. Her smoking use included Cigarettes. She has a 7.5 pack-year smoking history. She has never used smokeless tobacco. She reports that she drinks about 18.0 oz of alcohol per week. She reports that she uses illicit drugs ("Crack" cocaine). Lives with her son.  Moved from [place] 2014  Allergies:  Allergies  Allergen Reactions  . Gabapentin Itching and Swelling  . Saphris [Asenapine] Swelling    Swelling of the face  . Tramadol Swelling    Causes legs to swell  . Penicillins Hives    Has tolerated Cefepime and Ceftriaxone.    Medications Prior to Admission  Medication Sig Dispense Refill  . carvedilol (COREG) 25 MG tablet Take 1 tablet (25 mg total) by mouth 2 (two) times daily. 60 tablet 0  . DULoxetine (CYMBALTA) 30 MG capsule Take 1 capsule (30 mg total) by mouth daily. 30 capsule 0  . ferrous sulfate 325 (65 FE) MG tablet Take 1 tablet (325 mg total) by mouth daily with breakfast. 30 tablet 0  . folic acid (FOLVITE) A999333 MCG tablet Take 400 mcg by mouth every morning.    . furosemide (LASIX) 20 MG tablet Take 3 tablets (60 mg total)  by mouth every morning. (Patient taking differently: Take 80 mg by mouth every morning. ) 30 tablet   . insulin aspart (NOVOLOG) 100 UNIT/ML injection Inject 2-10 Units into the skin 3 (three) times daily before meals. SSI: 0-200 = 2 units, 201-250 = 4 units, 251-300 = 6 units, 301-350 = 8 units, 351-400 = 10 units    . insulin glargine (LANTUS) 100 UNIT/ML injection Inject 0.2 mLs (20 Units total) into the skin at bedtime. 10 mL 0  . levETIRAcetam (KEPPRA) 500 MG tablet Take 1 tablet (500 mg total) by mouth 2 (two) times daily. 60 tablet 0  . Multiple Vitamins-Minerals (SENIOR MULTIVITAMIN PLUS PO) Take 1 tablet by mouth every morning.    Marland Kitchen oxyCODONE-acetaminophen (PERCOCET/ROXICET) 5-325 MG per tablet Take 1 tablet by mouth every 6 (six) hours as needed for  moderate pain. (Patient taking differently: Take 2 tablets by mouth every 6 (six) hours as needed for moderate pain. ) 30 tablet 0  . polyethylene glycol (MIRALAX / GLYCOLAX) packet Take 17 g by mouth 2 (two) times daily.    . silver sulfADIAZINE (SILVADENE) 1 % cream Apply topically daily. 50 g 0  . sulfamethoxazole-trimethoprim (SEPTRA DS) 800-160 MG per tablet Take 1 tablet by mouth every 12 (twelve) hours. 14 tablet 0  . cloNIDine (CATAPRES) 0.1 MG tablet Take 1 tablet (0.1 mg total) by mouth 3 (three) times daily as needed (for hypertension). 60 tablet 11  . Elastic Bandages & Supports (T.E.D. KNEE LENGTH/S-REGULAR) MISC 1 each by Does not apply route daily. (Patient not taking: Reported on 07/05/2014) 3 each 0  . simethicone (MYLICON) 80 MG chewable tablet Chew 80 mg by mouth every 8 (eight) hours as needed for flatulence.       Lab Results: UA: >300 protein, 0-2 wbc, 0-2 RBC   Recent Labs  07/05/14 0801 07/06/14 0656 07/07/14 0534  WBC 3.5* 2.4* 2.9*  HGB 8.2* 7.6* 7.1*  HCT 25.6* 23.2* 22.2*  PLT 218 192 210   BMET  Recent Labs  07/05/14 0801 07/06/14 0656 07/07/14 0534  NA 135* 135* 137  K 4.2 4.2 4.6  CL 101 102 103  CO2 20 18* 22  GLUCOSE 146* 221* 190*  BUN 46* 46* 46*  CREATININE 3.47* 3.80* 4.07*  CALCIUM 8.1* 7.9* 8.0*   LFT  Recent Labs  07/06/14 0656  PROT 6.0  ALBUMIN 1.6*  AST 27  ALT 20  ALKPHOS 69  BILITOT <0.2*   US Renal  07/06/2014   CLINICAL DATA:  Acute renal failure  EXAM: RENAL/URINARY TRACT ULTRASOUND COMPLETE  COMPARISON:  June 05, 2014  FINDINGS: Right Kidney:  Length: 12.3 cm. Echogenicity is increased. Renal cortical thickness is normal. No mass, perinephric fluid, or hydronephrosis visualized. No sonographically demonstrable calculus or ureterectasis.  Left Kidney:  Length: 12.2 cm. Echogenicity is increased. Renal cortical thickness is normal. No mass, perinephric fluid, or hydronephrosis visualized. No sonographically  demonstrable calculus or ureterectasis.  Bladder:  Appears normal for degree of bladder distention.  There is mild ascites adjacent to the spleen. There are small pleural effusions noted bilaterally.  IMPRESSION: Echogenic kidneys, a finding that may be associated with medical renal disease. This finding was also noted on prior study. No obstructing foci are identified in either kidney. There is mild ascites as well as small pleural effusions bilaterally.   Electronically Signed   By: Lowella Grip M.D.   On: 07/06/2014 15:08   [place] Extrem Low Right Ltd  07/06/2014   CLINICAL DATA:  Posterior RIGHT leg and calf pain. Palpable abnormality. Initial encounter  EXAM: ULTRASOUND RIGHT LOWER EXTREMITY LIMITED  TECHNIQUE: Ultrasound examination of the lower extremity soft tissues was performed in the area of clinical concern.  COMPARISON:  None.  FINDINGS: Subcutaneous edema is present in the RIGHT calf. No abscess or focal fluid collections.  IMPRESSION: Subcutaneous edema of the RIGHT leg.   Electronically Signed   By: Dereck Ligas M.D.   On: 07/06/2014 15:16    ROS: No acute change in vision Occasional SOB No CP + constipation Chronic pain "in all joints" Chronic numbness hands and feet No dysuria  PHYSICAL EXAM: Blood pressure 154/88, pulse 83, temperature 98.4 F (36.9 C), temperature source Oral, resp. rate 17, height 5\' 4"  (1.626 m), weight 87.5 kg (192 lb 14.4 oz), SpO2 98 %. HEENT: PERRLA, EOMI NECK:No JVD LUNGS:few faint crackles Rt base CARDIAC:RRR wo MRG ABD:+ BS NTND No HSM EXT:tr edema on Lt, transmet Lt foot. 1+ brawny edema RT with palpable sub q nodules in calf NEURO:CNI decreased sensation feet, Ox3 No asterixis  Assessment: 1. Acute on CKD 4.  Acute most likely hemodynamically mediated.  CKD sec DM/HTN 2. Anemia 3. ? Cellulitis.  I also ? If she has calciphylaxis 4. Sz disorder PLAN: 1. Will give 1 dose of feraheme 2. Start aranesp 3. Check PO4 and PTH 4. Hold lasix  for today 5.  Dietitian to see for renal diet 6. I had a discussion with her regarding her advanced renal ds and the inevitability of renal replacement therapy.  She is adamant that she would not do dialysis and would simply prefer hospice when it comes to that.  Her views regarding an amputation are similar.  Goal is to keep her feeling as good as possible for as long as possible without doing HD. Ples Trudel T 07/07/2014, 10:14 AM

## 2014-07-07 NOTE — Plan of Care (Signed)
Problem: Phase I Progression Outcomes Goal: OOB as tolerated unless otherwise ordered Outcome: Completed/Met Date Met:  07/07/14

## 2014-07-07 NOTE — Plan of Care (Signed)
Problem: Phase I Progression Outcomes Goal: Temperature < 102 Outcome: Completed/Met Date Met:  07/07/14

## 2014-07-07 NOTE — Progress Notes (Signed)
Patient refused bed alarm be turned on.Patient advised to call for assistance.Call [facility] within reach. Ciearra Rufo, Wonda Cheng, Therapist, sports

## 2014-07-07 NOTE — Plan of Care (Signed)
Problem: Phase I Progression Outcomes Goal: Hemodynamically stable Outcome: Completed/Met Date Met:  07/07/14     

## 2014-07-07 NOTE — Plan of Care (Signed)
Problem: Phase II Progression Outcomes Goal: Progress activity as tolerated unless otherwise ordered Outcome: Completed/Met Date Met:  07/07/14     

## 2014-07-07 NOTE — Plan of Care (Signed)
Problem: Phase I Progression Outcomes Goal: Voiding-avoid urinary catheter unless indicated Outcome: Completed/Met Date Met:  07/07/14

## 2014-07-07 NOTE — Plan of Care (Signed)
Problem: Food- and Nutrition-Related Knowledge Deficit (NB-1.1) Goal: Nutrition education Formal process to instruct or train a patient/client in a skill or to impart knowledge to help patients/clients voluntarily manage or modify food choices and eating behavior to maintain or improve health. Outcome: Completed/Met Date Met:  07/07/14 Nutrition Education Note  RD consulted for Renal Education. Provided "Healthy Eating with Kidney Disease" to patient. Reviewed food groups and provided written recommended serving sizes specifically determined for patient's current nutritional status.   Explained why diet restrictions are needed and provided lists of foods to limit/avoid that are high potassium, sodium, and phosphorus. Provided specific recommendations on safer alternatives of these foods. Strongly encouraged compliance of this diet.   Discussed importance of protein intake at each meal and snack. Provided examples of how to maximize protein intake throughout the day. Discussed renal-friendly beverage options. Teach back method used.  Expect good compliance.  Body mass index is 33.1 kg/(m^2). Pt meets criteria for class I obesity based on current BMI.  Current diet order is carbohydrate modified, patient is consuming approximately 100% of meals at this time. Labs and medications reviewed. No further nutrition interventions warranted at this time. RD contact information provided. If additional nutrition issues arise, please re-consult RD.  Kallie Locks, MS, RD, LDN Pager # [phone] After hours/ weekend pager # [phone]

## 2014-07-07 NOTE — Plan of Care (Signed)
Problem: Phase I Progression Outcomes Goal: Pain controlled with appropriate interventions Outcome: Completed/Met Date Met:  07/07/14     

## 2014-07-07 NOTE — Progress Notes (Addendum)
Patient Demographics  [name], is a 48 y.o. female, DOB - 31-Dec-1965, [id number]  Admit date - 07/05/2014   Admitting Physician Charlynne Cousins, MD  Outpatient Primary MD for the patient is [name], MD  LOS - 2   Chief Complaint  Patient presents with  . Cellulitis        Subjective:   [name] today has, No headache, No chest pain, No abdominal pain - No Nausea, No new weakness tingling or numbness, No Cough - SOB.   Assessment & Plan    1.R Leg pain - there is mild cellulitis and 2+ edema, also a soft tissue swelling in the right calf area, venous ultrasound unremarkable, on empiric antibiotic, stable right calf soft tissue ultrasound ruled out an abscess.    2. ARF on Stage 4 chronic kidney disease due to type 2 diabetes mellitus - baseline creatinine around 3.2 to 3, currently in acute renal failure likely due to fluid overload, stable renal ultrasound, hold IV diuretics . Avoid nephrotoxins. Renal Called.    3. Anemia of chronic disease with Refusal of blood transfusions as patient is Jehovah's Witness - refused transfusion of PRBC says will rather become Hospice. Renal giving iron transfusion.    4. 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 In poor control, on Lantus and sliding scale will adjust dose for better control.  Lab Results  Component Value Date   HGBA1C 8.3* 07/05/2014    CBG (last 3)   Recent Labs  07/06/14 1629 07/06/14 2107 07/07/14 1119  GLUCAP 177* 142* 254*     5. Acute on chronic diastolic dysfunction. EF of 55% on recent echogram.Lasix on hold due to ARF, monitor BMP, continue Coreg.    6. History of seizures. On Keppra.    7. History of hep C. Outpatient follow-up with PCP and ID/GI as needed.     8. Essential hypertension. On  Coreg. Blood pressure still elevated, will add hydralazine for better control.    9. Chr R elbow injury with swelling - X ray non acute called Ortho.      Code Status: Full  Family Communication: none present  Disposition Plan: Home   Procedures Leg Venous US -ve , Renal US   Consults  Renal, ortho   Medications  Scheduled Meds: . carvedilol  25 mg Oral BID  . cefTRIAXone (ROCEPHIN)  IV  1 g Intravenous Q24H  . darbepoetin (ARANESP) injection - NON-DIALYSIS  100 mcg Subcutaneous Q Thu-1800  . DULoxetine  30 mg Oral Daily  . ferumoxytol  510 mg Intravenous Once  . heparin  5,000 Units Subcutaneous 3 times per day  . hydrALAZINE  25 mg Oral 3 times per day  . insulin aspart  0-9 Units Subcutaneous TID WC  . insulin aspart  3 Units Subcutaneous TID WC  . insulin glargine  30 Units Subcutaneous QHS  . levETIRAcetam  500 mg Oral BID  . mupirocin cream   Topical BID  . polyethylene glycol  17 g Oral BID  . sodium chloride  3 mL Intravenous Q12H  . vancomycin  750 mg Intravenous Q24H   Continuous Infusions:  PRN Meds:.sodium chloride, morphine injection, ondansetron **OR** ondansetron (ZOFRAN) IV, oxyCODONE-acetaminophen, polyethylene glycol, sodium  chloride  DVT Prophylaxis    Heparin   Lab Results  Component Value Date   PLT 210 07/07/2014    Antibiotics     Anti-infectives    Start     Dose/Rate Route Frequency Ordered Stop   07/05/14 1030  cefTRIAXone (ROCEPHIN) 1 g in dextrose 5 % 50 mL IVPB     1 g100 mL/hr over 30 Minutes Intravenous Every 24 hours 07/05/14 1017     07/05/14 1000  vancomycin (VANCOCIN) IVPB 750 mg/150 ml premix     750 mg150 mL/hr over 60 Minutes Intravenous Every 24 hours 07/05/14 0942            Objective:   Filed Vitals:   07/06/14 1012 07/06/14 1804 07/06/14 2109 07/07/14 0610  BP: 185/90 165/90 163/91 154/88  Pulse: 80 79 81 83  Temp: 97.8 F (36.6 C) 98.2 F (36.8 C) 98.4 F (36.9 C) 98.4 F (36.9 C)  TempSrc: Oral  Oral Oral Oral  Resp: 18  18 17   Height:      Weight:      SpO2: 100% 98% 98% 98%    Wt Readings from Last 3 Encounters:  07/05/14 87.5 kg (192 lb 14.4 oz)  07/03/14 92.08 kg (203 lb)  06/25/14 91.808 kg (202 lb 6.4 oz)     Intake/Output Summary (Last 24 hours) at 07/07/14 1311 Last data filed at 07/07/14 1200  Gross per 24 hour  Intake   1200 ml  Output   3050 ml  Net  -1850 ml     Physical Exam  Awake Alert, Oriented X 3, No new F.N deficits, Normal affect [place].AT,PERRAL Supple Neck,No JVD, No cervical lymphadenopathy appriciated.  Symmetrical Chest wall movement, Good air movement bilaterally, CTAB RRR,No Gallops,Rubs or new Murmurs, No Parasternal Heave +ve B.Sounds, Abd Soft, No tenderness, No organomegaly appriciated, No rebound - guarding or rigidity. 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   Data Review   Micro Results No results found for this or any previous visit (from the past 240 hour(s)).  Radiology Reports Dg Chest 2 View  06/22/2014   CLINICAL DATA:  Shortness of breath, migraine for 4 days, no bowel movement  EXAM: CHEST  2 VIEW  COMPARISON:  04/27/2014  FINDINGS: There is bilateral diffuse interstitial thickening. There is left lower lobe airspace disease. There is no significant pleural effusion or pneumothorax. There is stable cardiomegaly. The osseous structures are unremarkable.  IMPRESSION: Bilateral diffuse interstitial thickening and cardiomegaly most concerning for mild pulmonary edema. There is hazy left lower lobe airspace disease which may reflect atelectasis versus developing pneumonia.   Electronically Signed   By: Kathreen Devoid   On: 06/22/2014 16:05   Dg Elbow Complete Right  07/07/2014   CLINICAL DATA:  Chronic right elbow pain  EXAM: RIGHT ELBOW - COMPLETE 3+ VIEW  COMPARISON:  04/22/2014  FINDINGS: Four views of the right elbow  submitted. 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 There is increasing callus formation.  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   Electronically Signed   By: Lahoma Crocker M.D.   On: 07/07/2014 10:23   US Renal  07/06/2014   CLINICAL DATA:  Acute renal failure  EXAM: RENAL/URINARY TRACT ULTRASOUND COMPLETE  COMPARISON:  June 05, 2014  FINDINGS: Right Kidney:  Length: 12.3 cm.  Echogenicity is increased. Renal cortical thickness is normal. No mass, perinephric fluid, or hydronephrosis visualized. No sonographically demonstrable calculus or ureterectasis.  Left Kidney:  Length: 12.2 cm. Echogenicity is increased. Renal cortical thickness is normal. No mass, perinephric fluid, or hydronephrosis visualized. No sonographically demonstrable calculus or ureterectasis.  Bladder:  Appears normal for degree of bladder distention.  There is mild ascites adjacent to the spleen. There are small pleural effusions noted bilaterally.  IMPRESSION: Echogenic kidneys, a finding that may be associated with medical renal disease. This finding was also noted on prior study. No obstructing foci are identified in either kidney. There is mild ascites as well as small pleural effusions bilaterally.   Electronically Signed   By: Lowella Grip M.D.   On: 07/06/2014 15:08   [place] Extrem Low Right Ltd  07/06/2014   CLINICAL DATA:  Posterior RIGHT leg and calf pain. Palpable abnormality. Initial encounter  EXAM: ULTRASOUND RIGHT LOWER EXTREMITY LIMITED  TECHNIQUE: Ultrasound examination of the lower extremity soft tissues was performed in the area of clinical concern.  COMPARISON:  None.  FINDINGS: Subcutaneous edema is present in the RIGHT calf. No abscess or focal fluid collections.  IMPRESSION: Subcutaneous edema of the RIGHT leg.   Electronically Signed   By: Dereck Ligas M.D.   On: 07/06/2014 15:16     CBC  Recent  Labs Lab 07/05/14 0801 07/06/14 0656 07/07/14 0534  WBC 3.5* 2.4* 2.9*  HGB 8.2* 7.6* 7.1*  HCT 25.6* 23.2* 22.2*  PLT 218 192 210  MCV 83.1 85.6 84.7  MCH 26.6 28.0 27.1  MCHC 32.0 32.8 32.0  RDW 15.0 15.1 15.2  LYMPHSABS 1.5  --   --   MONOABS 0.3  --   --   EOSABS 0.1  --   --   BASOSABS 0.0  --   --     Chemistries   Recent Labs Lab 07/05/14 0801 07/06/14 0656 07/07/14 0534  NA 135* 135* 137  K 4.2 4.2 4.6  CL 101 102 103  CO2 20 18* 22  GLUCOSE 146* 221* 190*  BUN 46* 46* 46*  CREATININE 3.47* 3.80* 4.07*  CALCIUM 8.1* 7.9* 8.0*  AST 28 27  --   ALT 21 20  --   ALKPHOS 84 69  --   BILITOT <0.2* <0.2*  --    ------------------------------------------------------------------------------------------------------------------ estimated creatinine clearance is 18.1 mL/min (by C-G formula based on Cr of 4.07). ------------------------------------------------------------------------------------------------------------------  Recent Labs  07/05/14 1857  HGBA1C 8.3*   ------------------------------------------------------------------------------------------------------------------ No results for input(s): CHOL, HDL, LDLCALC, TRIG, CHOLHDL, LDLDIRECT in the last 72 hours. ------------------------------------------------------------------------------------------------------------------ No results for input(s): TSH, T4TOTAL, T3FREE, THYROIDAB in the last 72 hours.  Invalid input(s): FREET3 ------------------------------------------------------------------------------------------------------------------  Recent Labs  07/06/14 1222  VITAMINB12 696  FOLATE 16.0  FERRITIN 111  TIBC 237*  IRON 52  RETICCTPCT 1.6    Coagulation profile No results for input(s): INR, PROTIME in the last 168 hours.  No results for input(s): DDIMER in the last 72 hours.  Cardiac Enzymes No results for input(s): CKMB, TROPONINI, MYOGLOBIN in the last 168 hours.  Invalid  input(s): CK ------------------------------------------------------------------------------------------------------------------ Invalid input(s): POCBNP     Time Spent in minutes  35   SINGH,PRASHANT K M.D on 07/07/2014 at 1:11 PM  Between 7am to 7pm - Pager - [phone]  After 7pm go to [email or web address] - password [email or web address]  And look for the night coverage person covering for me after hours  [facility] Office  [phone]

## 2014-07-07 NOTE — Progress Notes (Signed)
MD notified of pt's current Hg level. Pt refusing blood transfusion. MD bedside to educate pt.

## 2014-07-07 NOTE — Plan of Care (Signed)
Problem: Phase I Progression Outcomes Goal: OOB as tolerated unless otherwise ordered Outcome: Progressing     

## 2014-07-07 NOTE — Progress Notes (Signed)
ANTIBIOTIC CONSULT NOTE - FOLLOW UP  Pharmacy Consult for Vancomycin Indication: cellulitis  Allergies  Allergen Reactions  . Gabapentin Itching and Swelling  . Saphris [Asenapine] Swelling    Swelling of the face  . Tramadol Swelling    Causes legs to swell  . Penicillins Hives    Has tolerated Cefepime and Ceftriaxone.    Patient Measurements: Height: 5\' 4"  (162.6 cm) Weight: 192 lb 14.4 oz (87.5 kg) IBW/kg (Calculated) : 54.7  Vital Signs: Temp: 98.4 F (36.9 C) (12/03 0610) Temp Source: Oral (12/03 0610) BP: 154/88 mmHg (12/03 0610) Pulse Rate: 83 (12/03 0610) Intake/Output from previous day: 12/02 0701 - 12/03 0700 In: 1080 [P.O.:1080] Out: 3300 [Urine:3300] Intake/Output from this shift: Total I/O In: 480 [P.O.:480] Out: -   Labs:  Recent Labs  07/05/14 0801 07/06/14 0656 07/06/14 2215 07/07/14 0534  WBC 3.5* 2.4*  --  2.9*  HGB 8.2* 7.6*  --  7.1*  PLT 218 192  --  210  LABCREA  --   --  33.19  --   CREATININE 3.47* 3.80*  --  4.07*   Estimated Creatinine Clearance: 18.1 mL/min (by C-G formula based on Cr of 4.07).  Assessment:  Day # 3 Vancomycin 750 mg IV q24hrs and Ceftriaxone 1 gram IV q24hrs. No cultures. Afb, WBC 2.9. Scr trending up.  Goal of Therapy:  Vancomycin trough level 10-15 mcg/ml  Plan:   Continue Vancomycin 750 mg IV q24hrs.  Will check Vanc trough level prior to am dose; hold dose until level available.   Follow renal function, progress.  Arty Baumgartner, [place] Pager: [phone] 07/07/2014,3:07 PM

## 2014-07-08 LAB — URINE CULTURE
CULTURE: NO GROWTH
Colony Count: NO GROWTH

## 2014-07-08 LAB — POTASSIUM: Potassium: 4.8 mEq/L (ref 3.7–5.3)

## 2014-07-08 LAB — GLUCOSE, CAPILLARY
GLUCOSE-CAPILLARY: 114 mg/dL — AB (ref 70–99)
Glucose-Capillary: 213 mg/dL — ABNORMAL HIGH (ref 70–99)
Glucose-Capillary: 140 mg/dL — ABNORMAL HIGH (ref 70–99)
Glucose-Capillary: 197 mg/dL — ABNORMAL HIGH (ref 70–99)

## 2014-07-08 LAB — BASIC METABOLIC PANEL
ANION GAP: 11 (ref 5–15)
BUN: 51 mg/dL — AB (ref 6–23)
CALCIUM: 7.7 mg/dL — AB (ref 8.4–10.5)
CO2: 22 meq/L (ref 19–32)
CREATININE: 4.24 mg/dL — AB (ref 0.50–1.10)
Chloride: 98 mEq/L (ref 96–112)
GFR calc Af Amer: 13 mL/min — ABNORMAL LOW (ref >90)
GFR calc non Af Amer: 11 mL/min — ABNORMAL LOW (ref >90)
Glucose, Bld: 214 mg/dL — ABNORMAL HIGH (ref 70–99)
Potassium: 6.2 mEq/L — ABNORMAL HIGH (ref 3.7–5.3)
Sodium: 131 mEq/L — ABNORMAL LOW (ref 137–147)

## 2014-07-08 LAB — VANCOMYCIN, TROUGH: VANCOMYCIN TR: 18.7 ug/mL (ref 10.0–20.0)

## 2014-07-08 LAB — PARATHYROID HORMONE, INTACT (NO CA): PTH: 121 pg/mL — ABNORMAL HIGH (ref 14–64)

## 2014-07-08 MED ORDER — POLYETHYLENE GLYCOL 3350 17 G PO PACK
17.0000 g | PACK | Freq: Two times a day (BID) | ORAL | Status: DC
  Administered 2014-07-08 – 2014-07-10 (×4): 17 g via ORAL
  Filled 2014-07-08 (×6): qty 1

## 2014-07-08 MED ORDER — FUROSEMIDE 80 MG PO TABS
80.0000 mg | ORAL_TABLET | Freq: Two times a day (BID) | ORAL | Status: DC
  Administered 2014-07-08 – 2014-07-10 (×4): 80 mg via ORAL
  Filled 2014-07-08 (×7): qty 1

## 2014-07-08 MED ORDER — VANCOMYCIN HCL 500 MG IV SOLR
500.0000 mg | INTRAVENOUS | Status: DC
  Administered 2014-07-08: 500 mg via INTRAVENOUS
  Filled 2014-07-08 (×2): qty 500

## 2014-07-08 MED ORDER — SODIUM POLYSTYRENE SULFONATE 15 GM/60ML PO SUSP
30.0000 g | Freq: Once | ORAL | Status: AC
  Administered 2014-07-08: 30 g via ORAL
  Filled 2014-07-08 (×2): qty 120

## 2014-07-08 MED ORDER — BISACODYL 5 MG PO TBEC
10.0000 mg | DELAYED_RELEASE_TABLET | Freq: Every day | ORAL | Status: DC
  Administered 2014-07-08 – 2014-07-10 (×3): 10 mg via ORAL
  Filled 2014-07-08 (×3): qty 2

## 2014-07-08 MED ORDER — BISACODYL 5 MG PO TBEC
DELAYED_RELEASE_TABLET | ORAL | Status: AC
  Filled 2014-07-08: qty 1

## 2014-07-08 MED ORDER — DOCUSATE SODIUM 100 MG PO CAPS
200.0000 mg | ORAL_CAPSULE | Freq: Two times a day (BID) | ORAL | Status: DC
  Administered 2014-07-08 – 2014-07-10 (×5): 200 mg via ORAL
  Filled 2014-07-08 (×5): qty 2

## 2014-07-08 NOTE — Progress Notes (Signed)
Patient placed on telemetry box 15, CCMD notified.

## 2014-07-08 NOTE — Progress Notes (Signed)
ANTIBIOTIC CONSULT NOTE - FOLLOW UP  Pharmacy Consult for Vancomycin Indication: cellulitis  Allergies  Allergen Reactions  . Gabapentin Itching and Swelling  . Saphris [Asenapine] Swelling    Swelling of the face  . Tramadol Swelling    Causes legs to swell  . Penicillins Hives    Has tolerated Cefepime and Ceftriaxone.    Patient Measurements: Height: 5\' 4"  (162.6 cm) Weight: 192 lb 14.4 oz (87.5 kg) IBW/kg (Calculated) : 54.7  Vital Signs: Temp: 98.2 F (36.8 C) (12/04 0950) Temp Source: Oral (12/04 0950) BP: 144/70 mmHg (12/04 0950) Pulse Rate: 80 (12/04 0950) Intake/Output from previous day: 12/03 0701 - 12/04 0700 In: 1260 [P.O.:1260] Out: [id number] [Urine:1650]  Labs:  Recent Labs  07/06/14 0656 07/06/14 2215 07/07/14 0534 07/08/14 0440  WBC 2.4*  --  2.9*  --   HGB 7.6*  --  7.1*  --   PLT 192  --  210  --   LABCREA  --  33.19  --   --   CREATININE 3.80*  --  4.07* 4.24*   Estimated Creatinine Clearance: 17.4 mL/min (by C-G formula based on Cr of 4.24).  Recent Labs  07/08/14 1005  VANCOTROUGH 18.7    Assessment:   Day # 4 Vancomycin 750 mg IV q24hrs and Ceftriaxone 1 gram IV q24hrs.  Scr continues to trend up.  Vanc trough level 18.7 mcg/ml, just above target range.  Discussed briefly with Dr. Candiss Norse.  Vanc to continue.  Goal of Therapy:  Vancomycin trough level 10-15 mcg/ml  Plan:   Decrease Vancomycin to 500 mg IV q24hrs.   Continue to follow renal function, progress.  Arty Baumgartner, [place] Pager: [phone] 07/08/2014,2:56 PM

## 2014-07-08 NOTE — Care Management Note (Signed)
Met with pt and discussed possible needs, however surgery for elbow pending, IM given and will continue to follow this pt . [place] has been attempting to follow however pt has been difficult to reach. [place] in to follow up with pt and arrange to follow at time of d/c.  CRoyal RN MPH , case manager, [phone]

## 2014-07-08 NOTE — Progress Notes (Signed)
Pt refusing bed alarm. Able to ambulate without difficulty. Pt educated and acknowledges to call for assistance. Staff will continue to monitor.

## 2014-07-08 NOTE — Progress Notes (Signed)
Patient refused bed alarm.  Ambulating in room with no difficulty.  Will continue to monitor.

## 2014-07-08 NOTE — Consult Note (Signed)
Reason for Consult: Nonunion right elbow supracondylar humerus fracture Referring Physician: Dr. Singh  [name] is an 48 y.o. female.  HPI: Patient is a 48-year-old woman with multiple medical problems who was treated with closed treatment for a supracondylar humerus fracture on the right. Patient has developed a nonunion and is seen today for consultation due to pain and the elbow.  Past Medical History  Diagnosis Date  . Neuropathy   . Glaucoma   . Bipolar 1 disorder   . CHF (congestive heart failure)   . Refusal of blood transfusions as patient is Jehovah's Witness   . Hypertension   . Shortness of breath   . TIA (transient ischemic attack) 2010  . Neuromuscular disorder     DIABETIC NEUROPATHY  . MRSA infection ?2014    "on the back of my head; spread throughout my bloodstream"  . Bipolar disorder, unspecified 02/06/2014  . High cholesterol   . Type II diabetes mellitus     INSULIN DEPENDENT  . Hepatitis C   . Seizure dx'd 2014    "don't know what kind; last one was ~ 04/2014"  . Chronic pain syndrome   . Chronic kidney disease (CKD), stage III (moderate)   . Schizoaffective disorder, unspecified condition 02/08/2014    Past Surgical History  Procedure Laterality Date  . Amputation Left 05/21/2013    Procedure: Left Midfoot AMPUTATION;  Surgeon: Marcus V Duda, MD;  Location: [facility] OR;  Service: Orthopedics;  Laterality: Left;  Left Midfoot Amputation  . Tubal ligation    . Colonoscopy with propofol N/A 06/22/2013    Procedure: COLONOSCOPY WITH PROPOFOL;  Surgeon: Marc E Magod, MD;  Location: [facility] END[facility]COPY;  Service: Endoscopy;  Laterality: N/A;  . Abdominal hysterectomy  2014  . Tonsillectomy  1970's  . Refractive surgery Bilateral 2013    Family History  Problem Relation Age of Onset  . Hypertension Mother   . Hypertension Father     Social History:  reports that she quit smoking about 3 weeks ago. Her smoking use included Cigarettes. She has a 7.5 pack-year smoking  history. She has never used smokeless tobacco. She reports that she drinks about 18.0 oz of alcohol per week. She reports that she uses illicit drugs ("Crack" cocaine).  Allergies:  Allergies  Allergen Reactions  . Gabapentin Itching and Swelling  . Saphris [Asenapine] Swelling    Swelling of the face  . Tramadol Swelling    Causes legs to swell  . Penicillins Hives    Has tolerated Cefepime and Ceftriaxone.    Medications: I have reviewed the patient's current medications.  Results for orders placed or performed during the hospital encounter of 07/05/14 (from the past 48 hour(s))  Glucose, capillary     Status: Abnormal   Collection Time: 07/06/14  9:07 PM  Result Value Ref Range   Glucose-Capillary 142 (H) 70 - 99 mg/dL  Urine culture     Status: None   Collection Time: 07/06/14 10:15 PM  Result Value Ref Range   Specimen Description URINE, RANDOM    Special Requests NONE    Culture  Setup Time      07/07/2014 00:42 Performed at [facility]    Colony Count NO GROWTH Performed at [facility]     Culture NO GROWTH Performed at [facility]     Report Status 07/08/2014 FINAL   Urinalysis, Routine w reflex microscopic     Status: Abnormal   Collection Time: 07/06/14 10:15 PM    Result Value Ref Range   Color, Urine YELLOW YELLOW   APPearance CLEAR CLEAR   Specific Gravity, Urine 1.012 1.005 - 1.030   pH 7.0 5.0 - 8.0   Glucose, UA 100 (A) NEGATIVE mg/dL   Hgb urine dipstick SMALL (A) NEGATIVE   Bilirubin Urine NEGATIVE NEGATIVE   Ketones, ur NEGATIVE NEGATIVE mg/dL   Protein, ur >300 (A) NEGATIVE mg/dL   Urobilinogen, UA 0.2 0.0 - 1.0 mg/dL   Nitrite NEGATIVE NEGATIVE   Leukocytes, UA NEGATIVE NEGATIVE  Creatinine, urine, random     Status: None   Collection Time: 07/06/14 10:15 PM  Result Value Ref Range   Creatinine, Urine 33.19 mg/dL  Osmolality, urine     Status: Abnormal   Collection Time: 07/06/14 10:15 PM  Result Value Ref Range    Osmolality, Ur 314 (L) 390 - 1090 mOsm/kg    Comment: Performed at [facility]  Sodium, urine, random     Status: None   Collection Time: 07/06/14 10:15 PM  Result Value Ref Range   Sodium, Ur 96 mEq/L  Urine microscopic-add on     Status: None   Collection Time: 07/06/14 10:15 PM  Result Value Ref Range   Squamous Epithelial / LPF RARE RARE   WBC, UA 0-2 <3 WBC/hpf   RBC / HPF 0-2 <3 RBC/hpf  Basic metabolic panel     Status: Abnormal   Collection Time: 07/07/14  5:34 AM  Result Value Ref Range   Sodium 137 137 - 147 mEq/L   Potassium 4.6 3.7 - 5.3 mEq/L   Chloride 103 96 - 112 mEq/L   CO2 22 19 - 32 mEq/L   Glucose, Bld 190 (H) 70 - 99 mg/dL   BUN 46 (H) 6 - 23 mg/dL   Creatinine, Ser 4.07 (H) 0.50 - 1.10 mg/dL   Calcium 8.0 (L) 8.4 - 10.5 mg/dL   GFR calc non Af Amer 12 (L) >90 mL/min   GFR calc Af Amer 14 (L) >90 mL/min    Comment: (NOTE) The eGFR has been calculated using the CKD EPI equation. This calculation has not been validated in all clinical situations. eGFR's persistently <90 mL/min signify possible Chronic Kidney Disease.    Anion gap 12 5 - 15  CBC     Status: Abnormal   Collection Time: 07/07/14  5:34 AM  Result Value Ref Range   WBC 2.9 (L) 4.0 - 10.5 K/uL   RBC 2.62 (L) 3.87 - 5.11 MIL/uL   Hemoglobin 7.1 (L) 12.0 - 15.0 g/dL   HCT 22.2 (L) 36.0 - 46.0 %   MCV 84.7 78.0 - 100.0 fL   MCH 27.1 26.0 - 34.0 pg   MCHC 32.0 30.0 - 36.0 g/dL   RDW 15.2 11.5 - 15.5 %   Platelets 210 150 - 400 K/uL  Glucose, capillary     Status: Abnormal   Collection Time: 07/07/14  7:57 AM  Result Value Ref Range   Glucose-Capillary 170 (H) 70 - 99 mg/dL   Comment 1 Notify RN   Glucose, capillary     Status: Abnormal   Collection Time: 07/07/14 11:19 AM  Result Value Ref Range   Glucose-Capillary 254 (H) 70 - 99 mg/dL  Glucose, capillary     Status: Abnormal   Collection Time: 07/07/14 11:49 AM  Result Value Ref Range   Glucose-Capillary 206 (H) 70 - 99  mg/dL   Comment 1 Notify RN   Phosphorus     Status: Abnormal     Collection Time: 07/07/14 12:20 PM  Result Value Ref Range   Phosphorus 5.3 (H) 2.3 - 4.6 mg/dL  Parathyroid hormone, intact (no Ca)     Status: Abnormal   Collection Time: 07/07/14 12:20 PM  Result Value Ref Range   PTH 121 (H) 14 - 64 pg/mL    Comment: (NOTE) Interpretive Guide:                             Intact PTH               Calcium                             ----------               ------- Normal Parathyroid           Normal                   Normal Hypoparathyroidism           Low or Low Normal        Low Hyperparathyroidism     Primary                 Normal or High           High     Secondary               High                     Normal or Low     Tertiary                High                     High Non-Parathyroid  Hypercalcemia              Low or Low Normal        High **Please note change in methodology. If re-baselining is needed, for patients who are being serially tested, please call customer service, within 3 days of collection, to request to add on the appropriate re-baselining test code for the previous methodology, at no charge.** Performed at [facility]   Glucose, capillary     Status: Abnormal   Collection Time: 07/07/14  4:45 PM  Result Value Ref Range   Glucose-Capillary 215 (H) 70 - 99 mg/dL   Comment 1 Notify RN   Glucose, capillary     Status: Abnormal   Collection Time: 07/07/14  9:26 PM  Result Value Ref Range   Glucose-Capillary 130 (H) 70 - 99 mg/dL  Basic metabolic panel     Status: Abnormal   Collection Time: 07/08/14  4:40 AM  Result Value Ref Range   Sodium 131 (L) 137 - 147 mEq/L   Potassium 6.2 (H) 3.7 - 5.3 mEq/L    Comment: HEMOLYSIS AT THIS LEVEL MAY AFFECT RESULT   Chloride 98 96 - 112 mEq/L   CO2 22 19 - 32 mEq/L   Glucose, Bld 214 (H) 70 - 99 mg/dL   BUN 51 (H) 6 - 23 mg/dL   Creatinine, Ser 4.24 (H) 0.50 - 1.10 mg/dL   Calcium 7.7 (L) 8.4 - 10.5  mg/dL   GFR calc non Af Amer 11 (L) >90 mL/min   GFR calc Af Amer 13 (L) >90 mL/min  Comment: (NOTE) The eGFR has been calculated using the CKD EPI equation. This calculation has not been validated in all clinical situations. eGFR's persistently <90 mL/min signify possible Chronic Kidney Disease.    Anion gap 11 5 - 15  Glucose, capillary     Status: Abnormal   Collection Time: 07/08/14  7:54 AM  Result Value Ref Range   Glucose-Capillary 213 (H) 70 - 99 mg/dL  Vancomycin, trough     Status: None   Collection Time: 07/08/14 10:05 AM  Result Value Ref Range   Vancomycin Tr 18.7 10.0 - 20.0 ug/mL  Glucose, capillary     Status: Abnormal   Collection Time: 07/08/14 11:50 AM  Result Value Ref Range   Glucose-Capillary 140 (H) 70 - 99 mg/dL  Potassium     Status: None   Collection Time: 07/08/14 12:14 PM  Result Value Ref Range   Potassium 4.8 3.7 - 5.3 mEq/L    Dg Elbow Complete Right  07/07/2014   CLINICAL DATA:  Chronic right elbow pain  EXAM: RIGHT ELBOW - COMPLETE 3+ VIEW  COMPARISON:  04/22/2014  FINDINGS: Four views of the right elbow submitted. 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 There is increasing callus formation.  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   Electronically Signed   By: Liviu  Pop M.D.   On: 07/07/2014 10:23    Review of Systems  All other systems reviewed and are negative.  Blood pressure 134/75, pulse 82, temperature 97.9 F (36.6 C), temperature source Oral, resp. rate 18, height 5' 4" (1.626 m), weight 87.5 kg (192 lb 14.4 oz), SpO2 96 %. Physical Exam Patient has pain with attempted range of motion of the right elbow she has swelling. Radiographs shows a nonunion of the supracondylar right elbow fracture. Assessment/Plan: Assessment: Nonunion supracondylar right elbow fracture  Plan: I have discussed the patient's case with Dr.  Landau and he will follow-up with her as an outpatient for surgical intervention.  DUDA,MARCUS V 07/08/2014, 4:51 PM      

## 2014-07-08 NOTE — Progress Notes (Signed)
Spoke with patient at bedside regarding the restart of services with [facility]. Patient verbalized her interest in [facility] services. Patient signed consent form and packet with information given for disease management support or other services. Patient will receive post hospital follow up calls and be assessed for home visits. Of note, [facility] Care Management services does not replace or interfere with any services that are arranged by inpatient case management or social work. Notified Inpatient RNCM of [place] [facility] involvement. For additional questions or referrals please contact Natividad Brood, RN, BSN CCM, [facility] Liaison at [phone].

## 2014-07-08 NOTE — Progress Notes (Signed)
S: Still with pain in Rt calf O:BP 140/84 mmHg  Pulse 74  Temp(Src) 97.8 F (36.6 C) (Oral)  Resp 17  Ht 5\' 4"  (1.626 m)  Wt 87.5 kg (192 lb 14.4 oz)  BMI 33.10 kg/m2  SpO2 94%  Intake/Output Summary (Last 24 hours) at 07/08/14 0920 Last data filed at 07/08/14 0644  Gross per 24 hour  Intake   1020 ml  Output   1650 ml  Net   -630 ml   Weight change:  IN:[id number] and alert CVS:RRR Resp:clear Abd:+ BS NTND Ext: 1-2+ edema NEURO:CNI Ox3 no asterixis   . carvedilol  25 mg Oral BID  . cefTRIAXone (ROCEPHIN)  IV  1 g Intravenous Q24H  . darbepoetin (ARANESP) injection - NON-DIALYSIS  100 mcg Subcutaneous Q Thu-1800  . DULoxetine  30 mg Oral Daily  . heparin  5,000 Units Subcutaneous 3 times per day  . hydrALAZINE  25 mg Oral 3 times per day  . insulin aspart  0-9 Units Subcutaneous TID WC  . insulin aspart  3 Units Subcutaneous TID WC  . insulin glargine  30 Units Subcutaneous QHS  . levETIRAcetam  500 mg Oral BID  . mupirocin cream   Topical BID  . polyethylene glycol  17 g Oral BID  . sodium chloride  3 mL Intravenous Q12H  . sodium polystyrene  30 g Oral Once  . vancomycin  750 mg Intravenous Q24H   Dg Elbow Complete Right  07/07/2014   CLINICAL DATA:  Chronic right elbow pain  EXAM: RIGHT ELBOW - COMPLETE 3+ VIEW  COMPARISON:  04/22/2014  FINDINGS: Four views of the right elbow submitted. 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 There is increasing callus formation.  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   Electronically Signed   By: Lahoma Crocker M.D.   On: 07/07/2014 10:23   US Renal  07/06/2014   CLINICAL DATA:  Acute renal failure  EXAM: RENAL/URINARY TRACT ULTRASOUND COMPLETE  COMPARISON:  June 05, 2014  FINDINGS: Right Kidney:  Length: 12.3 cm. Echogenicity is increased. Renal cortical thickness is normal. No mass, perinephric fluid, or hydronephrosis  visualized. No sonographically demonstrable calculus or ureterectasis.  Left Kidney:  Length: 12.2 cm. Echogenicity is increased. Renal cortical thickness is normal. No mass, perinephric fluid, or hydronephrosis visualized. No sonographically demonstrable calculus or ureterectasis.  Bladder:  Appears normal for degree of bladder distention.  There is mild ascites adjacent to the spleen. There are small pleural effusions noted bilaterally.  IMPRESSION: Echogenic kidneys, a finding that may be associated with medical renal disease. This finding was also noted on prior study. No obstructing foci are identified in either kidney. There is mild ascites as well as small pleural effusions bilaterally.   Electronically Signed   By: Lowella Grip M.D.   On: 07/06/2014 15:08   [place] Extrem Low Right Ltd  07/06/2014   CLINICAL DATA:  Posterior RIGHT leg and calf pain. Palpable abnormality. Initial encounter  EXAM: ULTRASOUND RIGHT LOWER EXTREMITY LIMITED  TECHNIQUE: Ultrasound examination of the lower extremity soft tissues was performed in the area of clinical concern.  COMPARISON:  None.  FINDINGS: Subcutaneous edema is present in the RIGHT calf. No abscess or focal fluid collections.  IMPRESSION: Subcutaneous edema of the RIGHT leg.   Electronically Signed   By: Dereck Ligas M.D.   On: 07/06/2014 15:16   BMET    Component Value Date/Time  NA 131* 07/08/2014 0440   K 6.2* 07/08/2014 0440   CL 98 07/08/2014 0440   CO2 22 07/08/2014 0440   GLUCOSE 214* 07/08/2014 0440   BUN 51* 07/08/2014 0440   CREATININE 4.24* 07/08/2014 0440   CALCIUM 7.7* 07/08/2014 0440   GFRNONAA 11* 07/08/2014 0440   GFRAA 13* 07/08/2014 0440   CBC    Component Value Date/Time   WBC 2.9* 07/07/2014 0534   WBC 5.0 07/20/2013 1456   RBC 2.62* 07/07/2014 0534   RBC 2.74* 07/06/2014 1222   RBC 3.18* 07/20/2013 1456   HGB 7.1* 07/07/2014 0534   HGB 9.3* 07/20/2013 1456   HCT 22.2* 07/07/2014 0534   HCT 28.8* 07/20/2013 1456    PLT 210 07/07/2014 0534   PLT 271 07/20/2013 1456   MCV 84.7 07/07/2014 0534   MCV 90.5 07/20/2013 1456   MCH 27.1 07/07/2014 0534   MCH 29.4 07/20/2013 1456   MCHC 32.0 07/07/2014 0534   MCHC 32.4 07/20/2013 1456   RDW 15.2 07/07/2014 0534   RDW 15.8* 07/20/2013 1456   LYMPHSABS 1.5 07/05/2014 0801   LYMPHSABS 2.3 07/20/2013 1456   MONOABS 0.3 07/05/2014 0801   MONOABS 0.3 07/20/2013 1456   EOSABS 0.1 07/05/2014 0801   EOSABS 0.1 07/20/2013 1456   BASOSABS 0.0 07/05/2014 0801   BASOSABS 0.0 07/20/2013 1456     Assessment: 1. Acute on CKD 4 2. Anemia on aranesp 3. ? 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 Sz disorder 5. Hyperkalemia  Plan: 1. Resume lasix 2. To get kayexalate and K recheck 3. Daily labs   [name]

## 2014-07-08 NOTE — Progress Notes (Signed)
Patient Demographics  [name], is a 48 y.o. female, DOB - [birth year], [id number]  Admit date - 07/05/2014   Admitting Physician Charlynne Cousins, MD  Outpatient Primary MD for the patient is [name], MD  LOS - 3   Chief Complaint  Patient presents with  . Cellulitis        Subjective:   [name] today has, No headache, No chest pain, No abdominal pain - No Nausea, No new weakness tingling or numbness, No Cough - SOB.   Assessment & Plan    1.R Leg pain - there is mild cellulitis and 2+ edema, venous ultrasound unremarkable, on empiric antibiotic, stable right calf soft tissue ultrasound ruled out an abscess.    2. 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 Avoid nephrotoxins. Renal Follwing    3. Anemia of chronic disease with Refusal of blood transfusions as patient is Jehovah's Witness - refused transfusion of PRBC says will rather become Hospice. Renal giving iron transfusion.    4. 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 In poor control, on Lantus and sliding scale will adjust dose for better control.  Lab Results  Component Value Date   HGBA1C 8.3* 07/05/2014    CBG (last 3)   Recent Labs  07/07/14 1645 07/07/14 2126 07/08/14 0754  GLUCAP 215* 130* 213*     5. Acute on chronic diastolic dysfunction. EF of 55% on recent echogram.Lasixper renal, monitor BMP, continue Coreg.    6. History of seizures. On Keppra.    7. History of hep C. Outpatient follow-up with PCP and ID/GI as needed.     8. Essential hypertension. On Coreg. Blood pressure still elevated, will add hydralazine for better control.    9. Chr R elbow injury with swelling - X ray non acute  called Ortho Dr Sharol Given to await for formal input. He has seen the patient.    10. Hyperkalemia. Treated with Kayexalate enema this morning, initiate bowel regimen as she is constipated, repeat potassium that was this afternoon and monitor. Renal on board.      Code Status: Full  Family Communication: none present  Disposition Plan: Home   Procedures Leg Venous US -ve , Renal US   Consults  Renal, ortho   Medications  Scheduled Meds: . carvedilol  25 mg Oral BID  . cefTRIAXone (ROCEPHIN)  IV  1 g Intravenous Q24H  . darbepoetin (ARANESP) injection - NON-DIALYSIS  100 mcg Subcutaneous Q Thu-1800  . DULoxetine  30 mg Oral Daily  . furosemide  80 mg Oral BID  . heparin  5,000 Units Subcutaneous 3 times per day  . hydrALAZINE  25 mg Oral 3 times per day  . insulin aspart  0-9 Units Subcutaneous TID WC  . insulin aspart  3 Units Subcutaneous TID WC  . insulin glargine  30 Units Subcutaneous QHS  . levETIRAcetam  500 mg Oral BID  . mupirocin cream   Topical BID  . polyethylene glycol  17 g Oral BID  . sodium chloride  3 mL Intravenous Q12H  . sodium polystyrene  30 g Oral Once  . vancomycin  750 mg Intravenous Q24H   Continuous Infusions:  PRN Meds:.sodium chloride, diphenhydrAMINE, morphine injection, ondansetron **OR** ondansetron (ZOFRAN) IV, oxyCODONE-acetaminophen, polyethylene glycol, sodium chloride  DVT Prophylaxis    Heparin   Lab Results  Component Value Date   PLT 210 07/07/2014    Antibiotics     Anti-infectives    Start     Dose/Rate Route Frequency Ordered Stop   07/05/14 1030  cefTRIAXone (ROCEPHIN) 1 g in dextrose 5 % 50 mL IVPB     1 g100 mL/hr over 30 Minutes Intravenous Every 24 hours 07/05/14 1017     07/05/14 1000  vancomycin (VANCOCIN) IVPB 750 mg/150 ml premix     750 mg150 mL/hr over 60 Minutes Intravenous Every 24 hours 07/05/14 0942            Objective:   Filed Vitals:   07/07/14 0610 07/07/14 2159 07/08/14 0417 07/08/14 0950   BP: 154/88 157/87 140/84 144/70  Pulse: 83 75 74 80  Temp: 98.4 F (36.9 C) 97.7 F (36.5 C) 97.8 F (36.6 C) 98.2 F (36.8 C)  TempSrc: Oral Oral Oral Oral  Resp: 17 18 17 18   Height:      Weight:      SpO2: 98% 98% 94% 96%    Wt Readings from Last 3 Encounters:  07/05/14 87.5 kg (192 lb 14.4 oz)  07/03/14 92.08 kg (203 lb)  06/25/14 91.808 kg (202 lb 6.4 oz)     Intake/Output Summary (Last 24 hours) at 07/08/14 1151 Last data filed at 07/08/14 0644  Gross per 24 hour  Intake   1020 ml  Output   1650 ml  Net   -630 ml     Physical Exam  Awake Alert, Oriented X 3, No new F.N deficits, Normal affect [place].AT,PERRAL Supple Neck,No JVD, No cervical lymphadenopathy appriciated.  Symmetrical Chest wall movement, Good air movement bilaterally, CTAB RRR,No Gallops,Rubs or new Murmurs, No Parasternal Heave +ve B.Sounds, Abd Soft, No tenderness, No organomegaly appriciated, No rebound - guarding or rigidity. 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   Data Review   Micro Results Recent Results (from the past 240 hour(s))  Urine culture     Status: None   Collection Time: 07/06/14 10:15 PM  Result Value Ref Range Status   Specimen Description URINE, RANDOM  Final   Special Requests NONE  Final   Culture  Setup Time   Final    07/07/2014 00:42 Performed at [place] Performed at [facility]   Final   Culture NO GROWTH Performed at [facility]   Final   Report Status 07/08/2014 FINAL  Final    Radiology Reports Dg Chest 2 View  06/22/2014   CLINICAL DATA:  Shortness of breath, migraine for 4 days, no bowel movement  EXAM: CHEST  2 VIEW  COMPARISON:  04/27/2014  FINDINGS: There is bilateral diffuse interstitial thickening. There is left lower lobe airspace disease. There is no significant pleural effusion  or pneumothorax. There is stable cardiomegaly. The osseous structures are unremarkable.  IMPRESSION: Bilateral diffuse interstitial thickening and cardiomegaly most concerning for mild pulmonary edema. There is hazy left lower lobe airspace disease which may reflect atelectasis versus developing pneumonia.   Electronically Signed   By: Kathreen Devoid   On: 06/22/2014 16:05   Dg Elbow Complete Right  07/07/2014   CLINICAL DATA:  Chronic right elbow pain  EXAM: RIGHT ELBOW - COMPLETE 3+ VIEW  COMPARISON:  04/22/2014  FINDINGS: Four views of the right elbow submitted. 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 There is increasing callus formation.  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   Electronically Signed   By: Lahoma Crocker M.D.   On: 07/07/2014 10:23   US Renal  07/06/2014   CLINICAL DATA:  Acute renal failure  EXAM: RENAL/URINARY TRACT ULTRASOUND COMPLETE  COMPARISON:  June 05, 2014  FINDINGS: Right Kidney:  Length: 12.3 cm. Echogenicity is increased. Renal cortical thickness is normal. No mass, perinephric fluid, or hydronephrosis visualized. No sonographically demonstrable calculus or ureterectasis.  Left Kidney:  Length: 12.2 cm. Echogenicity is increased. Renal cortical thickness is normal. No mass, perinephric fluid, or hydronephrosis visualized. No sonographically demonstrable calculus or ureterectasis.  Bladder:  Appears normal for degree of bladder distention.  There is mild ascites adjacent to the spleen. There are small pleural effusions noted bilaterally.  IMPRESSION: Echogenic kidneys, a finding that may be associated with medical renal disease. This finding was also noted on prior study. No obstructing foci are identified in either kidney. There is mild ascites as well as small pleural effusions bilaterally.   Electronically Signed   By: Lowella Grip M.D.   On: 07/06/2014 15:08    [place] Extrem Low Right Ltd  07/06/2014   CLINICAL DATA:  Posterior RIGHT leg and calf pain. Palpable abnormality. Initial encounter  EXAM: ULTRASOUND RIGHT LOWER EXTREMITY LIMITED  TECHNIQUE: Ultrasound examination of the lower extremity soft tissues was performed in the area of clinical concern.  COMPARISON:  None.  FINDINGS: Subcutaneous edema is present in the RIGHT calf. No abscess or focal fluid collections.  IMPRESSION: Subcutaneous edema of the RIGHT leg.   Electronically Signed   By: Dereck Ligas M.D.   On: 07/06/2014 15:16     CBC  Recent Labs Lab 07/05/14 0801 07/06/14 0656 07/07/14 0534  WBC 3.5* 2.4* 2.9*  HGB 8.2* 7.6* 7.1*  HCT 25.6* 23.2* 22.2*  PLT 218 192 210  MCV 83.1 85.6 84.7  MCH 26.6 28.0 27.1  MCHC 32.0 32.8 32.0  RDW 15.0 15.1 15.2  LYMPHSABS 1.5  --   --   MONOABS 0.3  --   --   EOSABS 0.1  --   --   BASOSABS 0.0  --   --     Chemistries   Recent Labs Lab 07/05/14 0801 07/06/14 0656 07/07/14 0534 07/08/14 0440  NA 135* 135* 137 131*  K 4.2 4.2 4.6 6.2*  CL 101 102 103 98  CO2 20 18* 22 22  GLUCOSE 146* 221* 190* 214*  BUN 46* 46* 46* 51*  CREATININE 3.47* 3.80* 4.07* 4.24*  CALCIUM 8.1* 7.9* 8.0* 7.7*  AST 28 27  --   --   ALT 21 20  --   --   ALKPHOS 84 69  --   --   BILITOT <0.2* <0.2*  --   --    ------------------------------------------------------------------------------------------------------------------ estimated creatinine clearance is 17.4 mL/min (by C-G formula based on Cr of 4.24). ------------------------------------------------------------------------------------------------------------------  Recent Labs  07/05/14 1857  HGBA1C 8.3*   ------------------------------------------------------------------------------------------------------------------ No results for input(s): CHOL, HDL, LDLCALC, TRIG, CHOLHDL, LDLDIRECT in the last 72  hours. ------------------------------------------------------------------------------------------------------------------ No results for input(s): TSH, T4TOTAL, T3FREE, THYROIDAB in the last 72 hours.  Invalid input(s): FREET3 ------------------------------------------------------------------------------------------------------------------  Recent  Labs  07/06/14 1222  VITAMINB12 696  FOLATE 16.0  FERRITIN 111  TIBC 237*  IRON 52  RETICCTPCT 1.6    Coagulation profile No results for input(s): INR, PROTIME in the last 168 hours.  No results for input(s): DDIMER in the last 72 hours.  Cardiac Enzymes No results for input(s): CKMB, TROPONINI, MYOGLOBIN in the last 168 hours.  Invalid input(s): CK ------------------------------------------------------------------------------------------------------------------ Invalid input(s): POCBNP     Time Spent in minutes  35   SINGH,PRASHANT K M.D on 07/08/2014 at 11:51 AM  Between 7am to 7pm - Pager - [phone]  After 7pm go to [email or web address] - password [email or web address]  And look for the night coverage person covering for me after hours  [facility] Office  [phone]

## 2014-07-08 NOTE — Plan of Care (Signed)
Problem: Phase II Progression Outcomes Goal: Tolerating diet Outcome: Completed/Met Date Met:  07/08/14

## 2014-07-09 LAB — BASIC METABOLIC PANEL
Anion gap: 13 (ref 5–15)
BUN: 51 mg/dL — ABNORMAL HIGH (ref 6–23)
CALCIUM: 8.5 mg/dL (ref 8.4–10.5)
CO2: 22 mEq/L (ref 19–32)
Chloride: 99 mEq/L (ref 96–112)
Creatinine, Ser: 4.3 mg/dL — ABNORMAL HIGH (ref 0.50–1.10)
GFR calc Af Amer: 13 mL/min — ABNORMAL LOW (ref >90)
GFR calc non Af Amer: 11 mL/min — ABNORMAL LOW (ref >90)
GLUCOSE: 201 mg/dL — AB (ref 70–99)
Potassium: 4.8 mEq/L (ref 3.7–5.3)
SODIUM: 134 meq/L — AB (ref 137–147)

## 2014-07-09 LAB — GLUCOSE, CAPILLARY
GLUCOSE-CAPILLARY: 208 mg/dL — AB (ref 70–99)
Glucose-Capillary: 230 mg/dL — ABNORMAL HIGH (ref 70–99)
Glucose-Capillary: 137 mg/dL — ABNORMAL HIGH (ref 70–99)
Glucose-Capillary: 131 mg/dL — ABNORMAL HIGH (ref 70–99)

## 2014-07-09 MED ORDER — INSULIN GLARGINE 100 UNIT/ML ~~LOC~~ SOLN
35.0000 [IU] | Freq: Every day | SUBCUTANEOUS | Status: DC
  Filled 2014-07-09 (×2): qty 0.35

## 2014-07-09 NOTE — Progress Notes (Signed)
Patient Demographics  [name], is a 48 y.o. female, DOB - 1966/02/25, [id number]  Admit date - 07/05/2014   Admitting Physician Charlynne Cousins, MD  Outpatient Primary MD for the patient is [name], MD  LOS - 4   Chief Complaint  Patient presents with  . Cellulitis        Subjective:   [name] today has, No headache, No chest pain, No abdominal pain - No Nausea, No new weakness tingling or numbness, No Cough - SOB.   Assessment & Plan    1.R Leg pain - there is mild cellulitis and 2+ edema, venous ultrasound unremarkable, stable right calf soft tissue ultrasound ruled out an abscess. In my opinion this is largely third spaced fluid with edema, we'll stop antibiotics as she is afebrile without any leukocytosis and monitor. Lasix per renal.    2. 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 Avoid nephrotoxins. Renal Follwing    3. Anemia of chronic disease with Refusal of blood transfusions as patient is Jehovah's Witness - refused transfusion of PRBC says will rather become Hospice. Renal giving iron transfusion.    4. 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 In poor control, on Lantus and sliding scale will adjust dose for better control.  Lab Results  Component Value Date   HGBA1C 8.3* 07/05/2014    CBG (last 3)   Recent Labs  07/08/14 1647 07/08/14 2141 07/09/14 0822  GLUCAP 114* 197* 208*     5. Acute on chronic diastolic dysfunction. EF of 55% on recent echogram.Lasix per renal, monitor BMP, continue Coreg.    6. History of seizures. On Keppra.    7. History of hep C. Outpatient follow-up with PCP and ID/GI as needed.     8. Essential hypertension. On Coreg.  Blood pressure still elevated, will add hydralazine for better control.    9. Chr R elbow injury with swelling - X ray non acute called Ortho Dr Sharol Given discussed the case with him, per Dr. due to patient will follow with Dr. Mardelle Matte postdischarge.    10. Hyperkalemia. Treated with Kayexalate enema and now stable.    Code Status: Full  Family Communication: Son  bedside  Disposition Plan: Home   Procedures Leg Venous US -ve , Renal US   Consults  Renal, ortho   Medications  Scheduled Meds: . bisacodyl  10 mg Oral Daily  . carvedilol  25 mg Oral BID  . cefTRIAXone (ROCEPHIN)  IV  1 g Intravenous Q24H  . darbepoetin (ARANESP) injection - NON-DIALYSIS  100 mcg Subcutaneous Q Thu-1800  . docusate sodium  200 mg Oral BID  . DULoxetine  30 mg Oral Daily  . furosemide  80 mg Oral BID  . heparin  5,000 Units Subcutaneous 3 times per day  . hydrALAZINE  25 mg Oral 3 times per day  . insulin aspart  0-9 Units Subcutaneous TID WC  . insulin aspart  3 Units Subcutaneous TID WC  . insulin glargine  30 Units Subcutaneous QHS  . levETIRAcetam  500 mg Oral BID  . mupirocin cream   Topical BID  .  polyethylene glycol  17 g Oral BID  . sodium chloride  3 mL Intravenous Q12H  . vancomycin  500 mg Intravenous Q24H   Continuous Infusions:  PRN Meds:.sodium chloride, diphenhydrAMINE, morphine injection, ondansetron **OR** ondansetron (ZOFRAN) IV, oxyCODONE-acetaminophen, polyethylene glycol, sodium chloride  DVT Prophylaxis    Heparin   Lab Results  Component Value Date   PLT 210 07/07/2014    Antibiotics     Anti-infectives    Start     Dose/Rate Route Frequency Ordered Stop   07/08/14 2200  vancomycin (VANCOCIN) 500 mg in sodium chloride 0.9 % 100 mL IVPB     500 mg100 mL/hr over 60 Minutes Intravenous Every 24 hours 07/08/14 1455     07/05/14 1030  cefTRIAXone (ROCEPHIN) 1 g in dextrose 5 % 50 mL IVPB     1 g100 mL/hr over 30 Minutes Intravenous Every 24 hours 07/05/14 1017      07/05/14 1000  vancomycin (VANCOCIN) IVPB 750 mg/150 ml premix  Status:  Discontinued     750 mg150 mL/hr over 60 Minutes Intravenous Every 24 hours 07/05/14 0942 07/08/14 1455          Objective:   Filed Vitals:   07/08/14 1648 07/08/14 2200 07/09/14 0457 07/09/14 0956  BP: 134/75 164/88 131/66 148/82  Pulse: 82 88 87 84  Temp: 97.9 F (36.6 C) 98.5 F (36.9 C) 98.2 F (36.8 C) 97.5 F (36.4 C)  TempSrc: Oral Oral Oral Oral  Resp: 18 18 18 18   Height:      Weight:  84.732 kg (186 lb 12.8 oz)    SpO2: 96% 100% 90% 100%    Wt Readings from Last 3 Encounters:  07/08/14 84.732 kg (186 lb 12.8 oz)  07/03/14 92.08 kg (203 lb)  06/25/14 91.808 kg (202 lb 6.4 oz)     Intake/Output Summary (Last 24 hours) at 07/09/14 1049 Last data filed at 07/09/14 0800  Gross per 24 hour  Intake    240 ml  Output      0 ml  Net    240 ml     Physical Exam  Awake Alert, Oriented X 3, No new F.N deficits, Normal affect [place].AT,PERRAL Supple Neck,No JVD, No cervical lymphadenopathy appriciated.  Symmetrical Chest wall movement, Good air movement bilaterally, CTAB RRR,No Gallops,Rubs or new Murmurs, No Parasternal Heave +ve B.Sounds, Abd Soft, No tenderness, No organomegaly appriciated, No rebound - guarding or rigidity. 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   Data Review   Micro Results Recent Results (from the past 240 hour(s))  Urine culture     Status: None   Collection Time: 07/06/14 10:15 PM  Result Value Ref Range Status   Specimen Description URINE, RANDOM  Final   Special Requests NONE  Final   Culture  Setup Time   Final    07/07/2014 00:42 Performed at [place] Performed at [facility]   Final   Culture NO GROWTH Performed at [facility]   Final   Report Status 07/08/2014 FINAL  Final     Radiology Reports Dg Chest 2 View  06/22/2014   CLINICAL DATA:  Shortness of breath, migraine for 4 days, no bowel movement  EXAM: CHEST  2 VIEW  COMPARISON:  04/27/2014  FINDINGS: There is bilateral diffuse interstitial thickening. There is left lower lobe  airspace disease. There is no significant pleural effusion or pneumothorax. There is stable cardiomegaly. The osseous structures are unremarkable.  IMPRESSION: Bilateral diffuse interstitial thickening and cardiomegaly most concerning for mild pulmonary edema. There is hazy left lower lobe airspace disease which may reflect atelectasis versus developing pneumonia.   Electronically Signed   By: Kathreen Devoid   On: 06/22/2014 16:05   Dg Elbow Complete Right  07/07/2014   CLINICAL DATA:  Chronic right elbow pain  EXAM: RIGHT ELBOW - COMPLETE 3+ VIEW  COMPARISON:  04/22/2014  FINDINGS: Four views of the right elbow submitted. 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 There is increasing callus formation.  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   Electronically Signed   By: Lahoma Crocker M.D.   On: 07/07/2014 10:23   US Renal  07/06/2014   CLINICAL DATA:  Acute renal failure  EXAM: RENAL/URINARY TRACT ULTRASOUND COMPLETE  COMPARISON:  June 05, 2014  FINDINGS: Right Kidney:  Length: 12.3 cm. Echogenicity is increased. Renal cortical thickness is normal. No mass, perinephric fluid, or hydronephrosis visualized. No sonographically demonstrable calculus or ureterectasis.  Left Kidney:  Length: 12.2 cm. Echogenicity is increased. Renal cortical thickness is normal. No mass, perinephric fluid, or hydronephrosis visualized. No sonographically demonstrable calculus or ureterectasis.  Bladder:  Appears normal for degree of bladder distention.  There is mild ascites adjacent to the spleen. There are small pleural effusions noted bilaterally.   IMPRESSION: Echogenic kidneys, a finding that may be associated with medical renal disease. This finding was also noted on prior study. No obstructing foci are identified in either kidney. There is mild ascites as well as small pleural effusions bilaterally.   Electronically Signed   By: Lowella Grip M.D.   On: 07/06/2014 15:08   [place] Extrem Low Right Ltd  07/06/2014   CLINICAL DATA:  Posterior RIGHT leg and calf pain. Palpable abnormality. Initial encounter  EXAM: ULTRASOUND RIGHT LOWER EXTREMITY LIMITED  TECHNIQUE: Ultrasound examination of the lower extremity soft tissues was performed in the area of clinical concern.  COMPARISON:  None.  FINDINGS: Subcutaneous edema is present in the RIGHT calf. No abscess or focal fluid collections.  IMPRESSION: Subcutaneous edema of the RIGHT leg.   Electronically Signed   By: Dereck Ligas M.D.   On: 07/06/2014 15:16     CBC  Recent Labs Lab 07/05/14 0801 07/06/14 0656 07/07/14 0534  WBC 3.5* 2.4* 2.9*  HGB 8.2* 7.6* 7.1*  HCT 25.6* 23.2* 22.2*  PLT 218 192 210  MCV 83.1 85.6 84.7  MCH 26.6 28.0 27.1  MCHC 32.0 32.8 32.0  RDW 15.0 15.1 15.2  LYMPHSABS 1.5  --   --   MONOABS 0.3  --   --   EOSABS 0.1  --   --   BASOSABS 0.0  --   --     Chemistries   Recent Labs Lab 07/05/14 0801 07/06/14 0656 07/07/14 0534 07/08/14 0440 07/08/14 1214 07/09/14 0733  NA 135* 135* 137 131*  --  134*  K 4.2 4.2 4.6 6.2* 4.8 4.8  CL 101 102 103 98  --  99  CO2 20 18* 22 22  --  22  GLUCOSE 146* 221* 190* 214*  --  201*  BUN 46* 46* 46* 51*  --  51*  CREATININE 3.47* 3.80* 4.07* 4.24*  --  4.30*  CALCIUM 8.1* 7.9* 8.0* 7.7*  --  8.5  AST 28  27  --   --   --   --   ALT 21 20  --   --   --   --   ALKPHOS 84 69  --   --   --   --   BILITOT <0.2* <0.2*  --   --   --   --    ------------------------------------------------------------------------------------------------------------------ estimated creatinine clearance is 16.8 mL/min (by C-G formula  based on Cr of 4.3). ------------------------------------------------------------------------------------------------------------------ No results for input(s): HGBA1C in the last 72 hours. ------------------------------------------------------------------------------------------------------------------ No results for input(s): CHOL, HDL, LDLCALC, TRIG, CHOLHDL, LDLDIRECT in the last 72 hours. ------------------------------------------------------------------------------------------------------------------ No results for input(s): TSH, T4TOTAL, T3FREE, THYROIDAB in the last 72 hours.  Invalid input(s): FREET3 ------------------------------------------------------------------------------------------------------------------  Recent Labs  07/06/14 1222  VITAMINB12 696  FOLATE 16.0  FERRITIN 111  TIBC 237*  IRON 52  RETICCTPCT 1.6    Coagulation profile No results for input(s): INR, PROTIME in the last 168 hours.  No results for input(s): DDIMER in the last 72 hours.  Cardiac Enzymes No results for input(s): CKMB, TROPONINI, MYOGLOBIN in the last 168 hours.  Invalid input(s): CK ------------------------------------------------------------------------------------------------------------------ Invalid input(s): POCBNP     Time Spent in minutes  35   Richardo Popoff K M.D on 07/09/2014 at 10:49 AM  Between 7am to 7pm - Pager - [phone]  After 7pm go to [email or web address] - password [email or web address]  And look for the night coverage person covering for me after hours  [facility] Office  [phone]

## 2014-07-09 NOTE — Plan of Care (Signed)
Problem: Phase I Progression Outcomes Goal: Pain controlled with appropriate interventions Outcome: Progressing Goal: OOB as tolerated unless otherwise ordered Outcome: Progressing Goal: Initial discharge plan identified Outcome: Progressing  Problem: Phase II Progression Outcomes Goal: Progress activity as tolerated unless otherwise ordered Outcome: Progressing Goal: Discharge plan established Outcome: Progressing Goal: Vital signs remain stable Outcome: Progressing Goal: IV changed to normal saline lock Outcome: Progressing  Problem: Phase III Progression Outcomes Goal: Pain controlled on oral analgesia Outcome: Progressing Goal: Activity at appropriate level-compared to baseline (UP IN CHAIR FOR HEMODIALYSIS)  Outcome: Progressing Goal: IV/normal saline lock discontinued Outcome: Progressing Goal: Discharge plan remains appropriate-arrangements made Outcome: Progressing  Problem: Discharge Progression Outcomes Goal: Discharge plan in place and appropriate Outcome: Progressing Goal: Pain controlled with appropriate interventions Outcome: Progressing Goal: Tolerating diet Outcome: Progressing Goal: Activity appropriate for discharge plan Outcome: Progressing

## 2014-07-09 NOTE — Progress Notes (Signed)
Patient refused to continue wearing telemetry monitor, after educating patient on the the reason she is wearing telemetry she still refused. Notified Dr. Candiss Norse, ordered that telemetry could be discontinued.

## 2014-07-09 NOTE — Progress Notes (Signed)
S: Still with pain in Rt calf O:BP 131/66 mmHg  Pulse 87  Temp(Src) 98.2 F (36.8 C) (Oral)  Resp 18  Ht 5\' 4"  (1.626 m)  Wt 84.732 kg (186 lb 12.8 oz)  BMI 32.05 kg/m2  SpO2 90% No intake or output data in the 24 hours ending 07/09/14 0953 Weight change:  [id number] and alert CVS:RRR Resp:clear Abd:+ BS NTND Ext: 1-2+ edema.  Back of Rt calf less tender NEURO:CNI Ox3 no asterixis   . bisacodyl  10 mg Oral Daily  . carvedilol  25 mg Oral BID  . cefTRIAXone (ROCEPHIN)  IV  1 g Intravenous Q24H  . darbepoetin (ARANESP) injection - NON-DIALYSIS  100 mcg Subcutaneous Q Thu-1800  . docusate sodium  200 mg Oral BID  . DULoxetine  30 mg Oral Daily  . furosemide  80 mg Oral BID  . heparin  5,000 Units Subcutaneous 3 times per day  . hydrALAZINE  25 mg Oral 3 times per day  . insulin aspart  0-9 Units Subcutaneous TID WC  . insulin aspart  3 Units Subcutaneous TID WC  . insulin glargine  30 Units Subcutaneous QHS  . levETIRAcetam  500 mg Oral BID  . mupirocin cream   Topical BID  . polyethylene glycol  17 g Oral BID  . sodium chloride  3 mL Intravenous Q12H  . vancomycin  500 mg Intravenous Q24H   Dg Elbow Complete Right  07/07/2014   CLINICAL DATA:  Chronic right elbow pain  EXAM: RIGHT ELBOW - COMPLETE 3+ VIEW  COMPARISON:  04/22/2014  FINDINGS: Four views of the right elbow submitted. 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 There is increasing callus formation.  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   Electronically Signed   By: Lahoma Crocker M.D.   On: 07/07/2014 10:23   BMET    Component Value Date/Time   NA 134* 07/09/2014 0733   K 4.8 07/09/2014 0733   CL 99 07/09/2014 0733   CO2 22 07/09/2014 0733   GLUCOSE 201* 07/09/2014 0733   BUN 51* 07/09/2014 0733   CREATININE 4.30* 07/09/2014 0733   CALCIUM 8.5 07/09/2014 0733   GFRNONAA 11* 07/09/2014 0733   GFRAA  13* 07/09/2014 0733   CBC    Component Value Date/Time   WBC 2.9* 07/07/2014 0534   WBC 5.0 07/20/2013 1456   RBC 2.62* 07/07/2014 0534   RBC 2.74* 07/06/2014 1222   RBC 3.18* 07/20/2013 1456   HGB 7.1* 07/07/2014 0534   HGB 9.3* 07/20/2013 1456   HCT 22.2* 07/07/2014 0534   HCT 28.8* 07/20/2013 1456   PLT 210 07/07/2014 0534   PLT 271 07/20/2013 1456   MCV 84.7 07/07/2014 0534   MCV 90.5 07/20/2013 1456   MCH 27.1 07/07/2014 0534   MCH 29.4 07/20/2013 1456   MCHC 32.0 07/07/2014 0534   MCHC 32.4 07/20/2013 1456   RDW 15.2 07/07/2014 0534   RDW 15.8* 07/20/2013 1456   LYMPHSABS 1.5 07/05/2014 0801   LYMPHSABS 2.3 07/20/2013 1456   MONOABS 0.3 07/05/2014 0801   MONOABS 0.3 07/20/2013 1456   EOSABS 0.1 07/05/2014 0801   EOSABS 0.1 07/20/2013 1456   BASOSABS 0.0 07/05/2014 0801   BASOSABS 0.0 07/20/2013 1456     Assessment: 1. Acute on CKD 4, UO fair.  Scr  Sl higher 2. Anemia on aranesp  SP feraheme 3. ? Cellulitis Rt calf  4. Sz disorder 5. Hyperkalemia  6. Mild Sec HPTH   PTH 121  Plan: 1. TED hose 2. Daily labs 3. Cont PO lasix   Estela Vinal T

## 2014-07-10 LAB — PROTEIN / CREATININE RATIO, URINE
Creatinine, Urine: 31.8 mg/dL
Protein Creatinine Ratio: 12.48 — ABNORMAL HIGH (ref 0.00–0.15)
Total Protein, Urine: 396.8 mg/dL

## 2014-07-10 LAB — BASIC METABOLIC PANEL
Anion gap: 11 (ref 5–15)
BUN: 50 mg/dL — ABNORMAL HIGH (ref 6–23)
CHLORIDE: 103 meq/L (ref 96–112)
CO2: 23 meq/L (ref 19–32)
CREATININE: 4.42 mg/dL — AB (ref 0.50–1.10)
Calcium: 8.3 mg/dL — ABNORMAL LOW (ref 8.4–10.5)
GFR calc Af Amer: 13 mL/min — ABNORMAL LOW (ref >90)
GFR calc non Af Amer: 11 mL/min — ABNORMAL LOW (ref >90)
GLUCOSE: 179 mg/dL — AB (ref 70–99)
Potassium: 4.6 mEq/L (ref 3.7–5.3)
Sodium: 137 mEq/L (ref 137–147)

## 2014-07-10 LAB — GLUCOSE, CAPILLARY: GLUCOSE-CAPILLARY: 215 mg/dL — AB (ref 70–99)

## 2014-07-10 MED ORDER — BISACODYL 5 MG PO TBEC: 10.0000 mg | DELAYED_RELEASE_TABLET | Freq: Every day | ORAL | Status: DC

## 2014-07-10 MED ORDER — OXYCODONE-ACETAMINOPHEN 5-325 MG PO TABS: 1.0000 | ORAL_TABLET | Freq: Four times a day (QID) | ORAL | Status: DC | PRN

## 2014-07-10 MED ORDER — FUROSEMIDE 80 MG PO TABS: 80.0000 mg | ORAL_TABLET | Freq: Two times a day (BID) | ORAL | Status: DC

## 2014-07-10 NOTE — Discharge Instructions (Signed)
Follow with Primary MD Maurice Small, D, MD in 3 days   Get CBC, CMP, 2 view Chest X ray checked  by Primary MD next visit.    Activity: As tolerated with Full fall precautions use walker/cane & assistance as needed   Disposition Home 3   Diet: Renal  Check your Weight same time everyday, if you gain over 2 pounds, or you develop in leg swelling, experience more shortness of breath or chest pain, call your Primary MD immediately. Follow Cardiac Low Salt Diet and 1.5 lit/day fluid restriction.   On your next visit with your primary care physician please Get Medicines reviewed and adjusted.   Please request your Prim.MD to go over all Hospital Tests and Procedure/Radiological results at the follow up, please get all Hospital records sent to your Prim MD by signing hospital release before you go home.   If you experience worsening of your admission symptoms, develop shortness of breath, life threatening emergency, suicidal or homicidal thoughts you must seek medical attention immediately by calling 911 or calling your MD immediately  if symptoms less severe.  You Must read complete instructions/literature along with all the possible adverse reactions/side effects for all the Medicines you take and that have been prescribed to you. Take any new Medicines after you have completely understood and accpet all the possible adverse reactions/side effects.   Do not drive, operating heavy machinery, perform activities at heights, swimming or participation in water activities or provide baby sitting services if your were admitted for syncope or siezures until you have seen by Primary MD or a Neurologist and advised to do so again.  Do not drive when taking Pain medications.    Do not take more than prescribed Pain, Sleep and Anxiety Medications  Special Instructions: If you have smoked or chewed Tobacco  in the last 2 yrs please stop smoking, stop any regular Alcohol  and or any Recreational drug  use.  Wear Seat belts while driving.   Please note  You were cared for by a hospitalist during your hospital stay. If you have any questions about your discharge medications or the care you received while you were in the hospital after you are discharged, you can call the unit and asked to speak with the hospitalist on call if the hospitalist that took care of you is not available. Once you are discharged, your primary care physician will handle any further medical issues. Please note that NO REFILLS for any discharge medications will be authorized once you are discharged, as it is imperative that you return to your primary care physician (or establish a relationship with a primary care physician if you do not have one) for your aftercare needs so that they can reassess your need for medications and monitor your lab values.

## 2014-07-10 NOTE — Plan of Care (Signed)
Problem: Phase I Progression Outcomes Goal: Pain controlled with appropriate interventions Outcome: Completed/Met Date Met:  07/10/14 Goal: OOB as tolerated unless otherwise ordered Outcome: Completed/Met Date Met:  07/10/14 Goal: Initial discharge plan identified Outcome: Completed/Met Date Met:  07/10/14 Goal: Voiding-avoid urinary catheter unless indicated Outcome: Completed/Met Date Met:  07/10/14 Goal: Hemodynamically stable Outcome: Completed/Met Date Met:  07/10/14 Goal: Other Phase I Outcomes/Goals Outcome: Completed/Met Date Met:  07/10/14  Problem: Phase II Progression Outcomes Goal: Progress activity as tolerated unless otherwise ordered Outcome: Completed/Met Date Met:  07/10/14 Goal: Discharge plan established Outcome: Completed/Met Date Met:  07/10/14 Goal: Vital signs remain stable Outcome: Completed/Met Date Met:  07/10/14 Goal: IV changed to normal saline lock Outcome: Completed/Met Date Met:  07/10/14 Goal: Obtain order to discontinue catheter if appropriate Outcome: Not Applicable Date Met:  17/12/78 Goal: Other Phase II Outcomes/Goals Outcome: Completed/Met Date Met:  07/10/14

## 2014-07-10 NOTE — Progress Notes (Signed)
S: Feel better and wants to go home O:BP 134/73 mmHg  Pulse 83  Temp(Src) 98.5 F (36.9 C) (Oral)  Resp 16  Ht 5\' 4"  (1.626 m)  Wt 83.915 kg (185 lb)  BMI 31.74 kg/m2  SpO2 96%  Intake/Output Summary (Last 24 hours) at 07/10/14 0851 Last data filed at 07/10/14 0849  Gross per 24 hour  Intake    240 ml  Output    350 ml  Net   -110 ml   Weight change: -0.816 kg (-1 lb 12.8 oz) [id number] and alert CVS:RRR Resp:clear Abd:+ BS NTND Ext: tr-1+ edema.  Back of Rt calf less tender NEURO:CNI Ox3 no asterixis   . bisacodyl  10 mg Oral Daily  . carvedilol  25 mg Oral BID  . darbepoetin (ARANESP) injection - NON-DIALYSIS  100 mcg Subcutaneous Q Thu-1800  . docusate sodium  200 mg Oral BID  . DULoxetine  30 mg Oral Daily  . furosemide  80 mg Oral BID  . heparin  5,000 Units Subcutaneous 3 times per day  . hydrALAZINE  25 mg Oral 3 times per day  . insulin aspart  0-9 Units Subcutaneous TID WC  . insulin aspart  3 Units Subcutaneous TID WC  . insulin glargine  35 Units Subcutaneous QHS  . levETIRAcetam  500 mg Oral BID  . mupirocin cream   Topical BID  . polyethylene glycol  17 g Oral BID  . sodium chloride  3 mL Intravenous Q12H   No results found. BMET    Component Value Date/Time   NA 137 07/10/2014 0500   K 4.6 07/10/2014 0500   CL 103 07/10/2014 0500   CO2 23 07/10/2014 0500   GLUCOSE 179* 07/10/2014 0500   BUN 50* 07/10/2014 0500   CREATININE 4.42* 07/10/2014 0500   CALCIUM 8.3* 07/10/2014 0500   GFRNONAA 11* 07/10/2014 0500   GFRAA 13* 07/10/2014 0500   CBC    Component Value Date/Time   WBC 2.9* 07/07/2014 0534   WBC 5.0 07/20/2013 1456   RBC 2.62* 07/07/2014 0534   RBC 2.74* 07/06/2014 1222   RBC 3.18* 07/20/2013 1456   HGB 7.1* 07/07/2014 0534   HGB 9.3* 07/20/2013 1456   HCT 22.2* 07/07/2014 0534   HCT 28.8* 07/20/2013 1456   PLT 210 07/07/2014 0534   PLT 271 07/20/2013 1456   MCV 84.7 07/07/2014 0534   MCV 90.5 07/20/2013 1456   MCH 27.1  07/07/2014 0534   MCH 29.4 07/20/2013 1456   MCHC 32.0 07/07/2014 0534   MCHC 32.4 07/20/2013 1456   RDW 15.2 07/07/2014 0534   RDW 15.8* 07/20/2013 1456   LYMPHSABS 1.5 07/05/2014 0801   LYMPHSABS 2.3 07/20/2013 1456   MONOABS 0.3 07/05/2014 0801   MONOABS 0.3 07/20/2013 1456   EOSABS 0.1 07/05/2014 0801   EOSABS 0.1 07/20/2013 1456   BASOSABS 0.0 07/05/2014 0801   BASOSABS 0.0 07/20/2013 1456     Assessment: 1. Acute on CKD 4, UO not being recorded.  Scr  Sl higher 2. Anemia on aranesp  SP feraheme 3. ? Cellulitis Rt calf  4. Sz disorder 5. Hyperkalemia 6. Mild Sec HPTH   PTH 121  Plan: 1.OK to go from renal standpoint.  Cont current dose of lasix.  She does not have a phone.  I gave her my card to call the office to arrange FU in 2 weeks.   Delvin Hedeen T

## 2014-07-10 NOTE — Progress Notes (Signed)
Discharge instructions and medications discussed with patient.  Prescriptions given to patient.  All questions answered.  

## 2014-07-10 NOTE — Discharge Summary (Signed)
[name], is a 48 y.o. female  DOB 01/19/1966  MRN [id number].  Admission date:  07/05/2014  Admitting Physician  Charlynne Cousins, MD  Discharge Date:  07/10/2014   Primary MD  [name], MD  Recommendations for primary care physician for things to follow:    Follow weight and BMP closely, needs to follow with renal in orthopedics on the close basis.   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 Neuropathy   . Glaucoma   . Bipolar 1 disorder   . CHF (congestive heart failure)   . Refusal of blood transfusions as patient is Jehovah's Witness   . Hypertension   . Shortness of breath   . TIA (transient ischemic attack) 2010  . Neuromuscular disorder     DIABETIC NEUROPATHY  . MRSA infection ?2014    "on the back of my head; spread throughout my bloodstream"  . Bipolar disorder, unspecified 02/06/2014  . High cholesterol   . Type II diabetes mellitus     INSULIN DEPENDENT  . Hepatitis C   . Seizure dx'd 2014    "don't know what kind; last one was ~ 04/2014"  . Chronic pain syndrome   . Chronic kidney disease (CKD), stage III (moderate)   . Schizoaffective disorder, unspecified condition 02/08/2014    Past Surgical History  Procedure  Laterality Date  . Amputation Left 05/21/2013    Procedure: Left Midfoot AMPUTATION;  Surgeon: Newt Minion, MD;  Location: [facility];  Service: Orthopedics;  Laterality: Left;  Left Midfoot Amputation  . Tubal ligation    . Colonoscopy with propofol N/A 06/22/2013    Procedure: COLONOSCOPY WITH PROPOFOL;  Surgeon: Jeryl Columbia, MD;  Location: [facility];  Service: Endoscopy;  Laterality: N/A;  . Abdominal hysterectomy  2014  . Tonsillectomy  1970's  . Refractive surgery Bilateral 2013       History of present illness and  Hospital Course:     Kindly see H&P for history of present illness and admission details, please review complete Labs, Consult reports and Test reports for all details in brief  HPI  from the history and physical done on the day of admission   [name] is a 48 y.o. female past medical history of chronic diastolic heart failure, stage IV chronic kidney disease she doesn't want dialysis, hypertension that is seen diabetes type 2 that was recently [facility] for acute  decompensated heart failure that comes in for right lower extremity tenderness and knot in the back of her legs. She relates this started 2 days prior to admission she came to the ED Dopplers were done which showed no DVT she was started on Bactrim but she relates this progressively got worse. She denies any fever or chills, nausea .    Hospital Course    1.R Leg pain - there was ? mild cellulitis and 2+ edema, venous ultrasound unremarkable, stable right calf soft tissue ultrasound ruled out an abscess. In my opinion this is largely third spaced fluid with edema, we have stopped antibiotics as she is afebrile without any leukocytosis and monitor. Lasix per renal now at 80 mg by mouth twice a day and leg edema improving.    2. 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 Seen by renal, Lasix changed to 80 mg by mouth  twice a day from 160 mg twice a day, discussed with Dr. Mercy Moore nephrologist today who cleared her for home discharge on this dose of Lasix. We will follow with him in the office within a week or so.    3. Anemia of chronic disease with Refusal of blood transfusions as patient is Jehovah's Witness - refused transfusion of PRBC says will rather become Hospice. Renal gave iron transfusion.    4. 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 In poor control, A1c 8.3, home regimen continued upon discharge, request PCP to monitor glycemic control closely.    5. Acute on chronic diastolic dysfunction. EF of 55% on recent echogram. Lasix now 80mg  PO BID per renal, continue Coreg.    6. History of seizures. On Keppra.    7. History of hep C. Outpatient follow-up with PCP and ID/GI as needed.    8. Essential hypertension. Resume home rx, PCP to monitor.    9. Chr R elbow injury with swelling - X ray non acute called Ortho Dr Sharol Given discussed the case with him, per Dr. due to patient will follow with Dr. Mardelle Matte post discharge.    10. Hyperkalemia. Treated with Kayexalate enema and now stable. Lasix resumed.        Discharge Condition: Stable   Follow UP  Follow-up Information    Follow up with LANDAU,JOSHUA P, MD In 1 week.   Specialty:  Orthopedic Surgery   Why:  treatment for elbow nonunion   Contact information:   [address] [place] [address] [phone]       Follow up with Newt Minion, MD.   Specialty:  Orthopedic Surgery   Why:  As needed for feet   Contact information:   [address] [address] [phone]       Follow up with Maurice Small, D, MD. Schedule an appointment as soon as possible for a visit in 3 days.   Specialty:  Family Medicine   Contact information:   [address] [place] [address] [phone]       Follow up with Windy Kalata, MD. Schedule an appointment as soon as  possible for a visit in 1 week.   Specialty:  Nephrology   Contact information:   [place] [address] [phone]         Discharge Instructions  and  Discharge Medications      Discharge Instructions    Discharge  instructions    Complete by:  As directed   Follow with Primary MD WEBB, CAROL, D, MD in 3 days   Get CBC, CMP, 2 view Chest X ray checked  by Primary MD next visit.    Activity: As tolerated with Full fall precautions use walker/cane & assistance as needed   Disposition Home 3   Diet: Renal - Low Carb - Check your Weight same time everyday, if you gain over 2 pounds, or you develop in leg swelling, experience more shortness of breath or chest pain, call your Primary MD immediately. Follow Cardiac Low Salt Diet and 1.5 lit/day fluid restriction.   On your next visit with your primary care physician please Get Medicines reviewed and adjusted.   Please request your Prim.MD to go over all Hospital Tests and Procedure/Radiological results at the follow up, please get all Hospital records sent to your Prim MD by signing hospital release before you go home.   If you experience worsening of your admission symptoms, develop shortness of breath, life threatening emergency, suicidal or homicidal thoughts you must seek medical attention immediately by calling 911 or calling your MD immediately  if symptoms less severe.  You Must read complete instructions/literature along with all the possible adverse reactions/side effects for all the Medicines you take and that have been prescribed to you. Take any new Medicines after you have completely understood and accpet all the possible adverse reactions/side effects.   Do not drive, operating heavy machinery, perform activities at heights, swimming or participation in water activities or provide baby sitting services if your were admitted for syncope or siezures until you have seen by Primary MD or a Neurologist and  advised to do so again.  Do not drive when taking Pain medications.    Do not take more than prescribed Pain, Sleep and Anxiety Medications  Special Instructions: If you have smoked or chewed Tobacco  in the last 2 yrs please stop smoking, stop any regular Alcohol  and or any Recreational drug use.  Wear Seat belts while driving.   Please note  You were cared for by a hospitalist during your hospital stay. If you have any questions about your discharge medications or the care you received while you were in the hospital after you are discharged, you can call the unit and asked to speak with the hospitalist on call if the hospitalist that took care of you is not available. Once you are discharged, your primary care physician will handle any further medical issues. Please note that NO REFILLS for any discharge medications will be authorized once you are discharged, as it is imperative that you return to your primary care physician (or establish a relationship with a primary care physician if you do not have one) for your aftercare needs so that they can reassess your need for medications and monitor your lab values.     Increase activity slowly    Complete by:  As directed             Medication List    TAKE these medications        bisacodyl 5 MG EC tablet  Commonly known as:  DULCOLAX  Take 2 tablets (10 mg total) by mouth daily.     carvedilol 25 MG tablet  Commonly known as:  COREG  Take 1 tablet (25 mg total) by mouth 2 (two) times daily.     cloNIDine 0.1 MG tablet  Commonly known as:  CATAPRES  Take 1  tablet (0.1 mg total) by mouth 3 (three) times daily as needed (for hypertension).     DULoxetine 30 MG capsule  Commonly known as:  CYMBALTA  Take 1 capsule (30 mg total) by mouth daily.     ferrous sulfate 325 (65 FE) MG tablet  Take 1 tablet (325 mg total) by mouth daily with breakfast.     folic acid A999333 MCG tablet  Commonly known as:  FOLVITE  Take 400 mcg by  mouth every morning.     furosemide 80 MG tablet  Commonly known as:  LASIX  Take 1 tablet (80 mg total) by mouth 2 (two) times daily.     insulin aspart 100 UNIT/ML injection  Commonly known as:  novoLOG  Inject 2-10 Units into the skin 3 (three) times daily before meals. SSI: 0-200 = 2 units, 201-250 = 4 units, 251-300 = 6 units, 301-350 = 8 units, 351-400 = 10 units     insulin glargine 100 UNIT/ML injection  Commonly known as:  LANTUS  Inject 0.2 mLs (20 Units total) into the skin at bedtime.     levETIRAcetam 500 MG tablet  Commonly known as:  KEPPRA  Take 1 tablet (500 mg total) by mouth 2 (two) times daily.     oxyCODONE-acetaminophen 5-325 MG per tablet  Commonly known as:  PERCOCET/ROXICET  Take 1 tablet by mouth every 6 (six) hours as needed for moderate pain.     polyethylene glycol packet  Commonly known as:  MIRALAX / GLYCOLAX  Take 17 g by mouth 2 (two) times daily.     SENIOR MULTIVITAMIN PLUS PO  Take 1 tablet by mouth every morning.     silver sulfADIAZINE 1 % cream  Commonly known as:  SILVADENE  Apply topically daily.     simethicone 80 MG chewable tablet  Commonly known as:  MYLICON  Chew 80 mg by mouth every 8 (eight) hours as needed for flatulence.     sulfamethoxazole-trimethoprim 800-160 MG per tablet  Commonly known as:  SEPTRA DS  Take 1 tablet by mouth every 12 (twelve) hours.     T.E.D. KNEE LENGTH/S-REGULAR Misc  1 each by Does not apply route daily.          Diet and Activity recommendation: See Discharge Instructions above   Consults obtained - Renal, Ortho   Major procedures and Radiology Reports - PLEASE review detailed and final reports for all details, in brief -       Dg Chest 2 View  06/22/2014   CLINICAL DATA:  Shortness of breath, migraine for 4 days, no bowel movement  EXAM: CHEST  2 VIEW  COMPARISON:  04/27/2014  FINDINGS: There is bilateral diffuse interstitial thickening. There is left lower lobe airspace  disease. There is no significant pleural effusion or pneumothorax. There is stable cardiomegaly. The osseous structures are unremarkable.  IMPRESSION: Bilateral diffuse interstitial thickening and cardiomegaly most concerning for mild pulmonary edema. There is hazy left lower lobe airspace disease which may reflect atelectasis versus developing pneumonia.   Electronically Signed   By: Kathreen Devoid   On: 06/22/2014 16:05   Dg Elbow Complete Right  07/07/2014   CLINICAL DATA:  Chronic right elbow pain  EXAM: RIGHT ELBOW - COMPLETE 3+ VIEW  COMPARISON:  04/22/2014  FINDINGS: Four views of the right elbow submitted. 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 There is increasing callus formation.  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   Electronically Signed   By: Lahoma Crocker M.D.   On: 07/07/2014 10:23   US Renal  07/06/2014   CLINICAL DATA:  Acute renal failure  EXAM: RENAL/URINARY TRACT ULTRASOUND COMPLETE  COMPARISON:  June 05, 2014  FINDINGS: Right Kidney:  Length: 12.3 cm. Echogenicity is increased. Renal cortical thickness is normal. No mass, perinephric fluid, or hydronephrosis visualized. No sonographically demonstrable calculus or ureterectasis.  Left Kidney:  Length: 12.2 cm. Echogenicity is increased. Renal cortical thickness is normal. No mass, perinephric fluid, or hydronephrosis visualized. No sonographically demonstrable calculus or ureterectasis.  Bladder:  Appears normal for degree of bladder distention.  There is mild ascites adjacent to the spleen. There are small pleural effusions noted bilaterally.  IMPRESSION: Echogenic kidneys, a finding that may be associated with medical renal disease. This finding was also noted on prior study. No obstructing foci are identified in either kidney. There is mild ascites as well as small pleural effusions bilaterally.   Electronically Signed    By: Lowella Grip M.D.   On: 07/06/2014 15:08   [place] Extrem Low Right Ltd  07/06/2014   CLINICAL DATA:  Posterior RIGHT leg and calf pain. Palpable abnormality. Initial encounter  EXAM: ULTRASOUND RIGHT LOWER EXTREMITY LIMITED  TECHNIQUE: Ultrasound examination of the lower extremity soft tissues was performed in the area of clinical concern.  COMPARISON:  None.  FINDINGS: Subcutaneous edema is present in the RIGHT calf. No abscess or focal fluid collections.  IMPRESSION: Subcutaneous edema of the RIGHT leg.   Electronically Signed   By: Dereck Ligas M.D.   On: 07/06/2014 15:16    Micro Results      Recent Results (from the past 240 hour(s))  Urine culture     Status: None   Collection Time: 07/06/14 10:15 PM  Result Value Ref Range Status   Specimen Description URINE, RANDOM  Final   Special Requests NONE  Final   Culture  Setup Time   Final    07/07/2014 00:42 Performed at [facility] Performed at [facility]   Final   Culture NO GROWTH Performed at [facility]   Final   Report Status 07/08/2014 FINAL  Final       Today   Subjective:   [name] today has no headache,no chest abdominal pain,no new weakness tingling or numbness, feels much better wants to go home today.   Objective:   Blood pressure 149/86, pulse 85, temperature 99.1 F (37.3 C), temperature source Oral, resp. rate 17, height 5\' 4"  (1.626 m), weight 83.915 kg (185 lb), SpO2 100 %.   Intake/Output Summary (Last 24 hours) at 07/10/14 1036 Last data filed at 07/10/14 0900  Gross per 24 hour  Intake    480 ml  Output    350 ml  Net    130 ml    Exam Awake Alert, Oriented x 3, No new F.N deficits, Normal affect [place].AT,PERRAL Supple Neck,No JVD, No cervical lymphadenopathy appriciated.  Symmetrical Chest wall movement, Good air movement bilaterally, CTAB RRR,No Gallops,Rubs or new Murmurs, No Parasternal Heave +ve B.Sounds, Abd Soft, Non  tender, No organomegaly appriciated, No rebound -guarding or rigidity. No Cyanosis, Clubbing , 1+ leg edema, No new Rash or bruise  Data Review   CBC w Diff: Lab Results  Component Value Date   WBC 2.9* 07/07/2014   WBC 5.0 07/20/2013   HGB 7.1* 07/07/2014   HGB 9.3* 07/20/2013  HCT 22.2* 07/07/2014   HCT 28.8* 07/20/2013   PLT 210 07/07/2014   PLT 271 07/20/2013   LYMPHOPCT 43 07/05/2014   LYMPHOPCT 45.4 07/20/2013   MONOPCT 8 07/05/2014   MONOPCT 6.4 07/20/2013   EOSPCT 3 07/05/2014   EOSPCT 1.6 07/20/2013   BASOPCT 1 07/05/2014   BASOPCT 0.7 07/20/2013    CMP: Lab Results  Component Value Date   NA 137 07/10/2014   K 4.6 07/10/2014   CL 103 07/10/2014   CO2 23 07/10/2014   BUN 50* 07/10/2014   CREATININE 4.42* 07/10/2014   PROT 6.0 07/06/2014   ALBUMIN 1.6* 07/06/2014   BILITOT <0.2* 07/06/2014   ALKPHOS 69 07/06/2014   AST 27 07/06/2014   ALT 20 07/06/2014  .   Total Time in preparing paper work, data evaluation and todays exam - 35 minutes  Thurnell Lose M.D on 07/10/2014 at 10:36 AM  [facility] Office  [phone]

## 2014-07-11 LAB — PROTEIN ELECTROPHORESIS, SERUM
ALPHA-1-GLOBULIN: 6.9 % — AB (ref 2.9–4.9)
ALPHA-2-GLOBULIN: 13.9 % — AB (ref 7.1–11.8)
Albumin ELP: 33.6 % — ABNORMAL LOW (ref 55.8–66.1)
Beta 2: 7 % — ABNORMAL HIGH (ref 3.2–6.5)
Beta Globulin: 6.9 % (ref 4.7–7.2)
Gamma Globulin: 31.7 % — ABNORMAL HIGH (ref 11.1–18.8)
M-SPIKE, %: NOT DETECTED g/dL
Total Protein ELP: 5.9 g/dL — ABNORMAL LOW (ref 6.0–8.3)

## 2014-07-12 NOTE — Care Management Note (Signed)
CARE MANAGEMENT NOTE 07/12/2014  Patient:  [name], [name]   Account Number:  [id number]  Date Initiated:  07/07/2014  Documentation initiated by:  Othel Dicostanzo  Subjective/Objective Assessment:   CM following for progression and d/c planning.     Action/Plan:   Pt with [facility] following, pt has been noncompliant with [facility] services.   Anticipated DC Date:  07/10/2014   Anticipated DC Plan:  [place]         Choice offered to / List presented to:             Status of service:  Completed, signed off Medicare Important Message given?  YES (If response is "NO", the following Medicare IM given date fields will be blank) Date Medicare IM given:  07/08/2014 Medicare IM given by:  Raevyn Sokol Date Additional Medicare IM given:   Additional Medicare IM given by:    Discharge Disposition:  HOME/SELF CARE  Per UR Regulation:    If discussed at Long Length of Stay Meetings, dates discussed:    Comments:  [facility] to follow this pt in the community.

## 2014-07-22 ENCOUNTER — Encounter (HOSPITAL_COMMUNITY): Payer: Self-pay | Admitting: Emergency Medicine

## 2014-07-22 ENCOUNTER — Emergency Department (HOSPITAL_COMMUNITY)
Admission: EM | Admit: 2014-07-22 | Discharge: 2014-07-22 | Disposition: A | Discharge status: Home / Self Care | Payer: Medicare Other | Attending: Emergency Medicine | Admitting: Emergency Medicine

## 2014-07-22 ENCOUNTER — Emergency Department (HOSPITAL_COMMUNITY): Payer: Medicare Other

## 2014-07-22 DIAGNOSIS — Z8673 Personal history of transient ischemic attack (TIA), and cerebral infarction without residual deficits: Secondary | ICD-10-CM | POA: Diagnosis not present

## 2014-07-22 DIAGNOSIS — F319 Bipolar disorder, unspecified: Secondary | ICD-10-CM | POA: Insufficient documentation

## 2014-07-22 DIAGNOSIS — Z8619 Personal history of other infectious and parasitic diseases: Secondary | ICD-10-CM | POA: Diagnosis not present

## 2014-07-22 DIAGNOSIS — I509 Heart failure, unspecified: Secondary | ICD-10-CM | POA: Diagnosis not present

## 2014-07-22 DIAGNOSIS — Z79899 Other long term (current) drug therapy: Secondary | ICD-10-CM | POA: Diagnosis not present

## 2014-07-22 DIAGNOSIS — S42411P Displaced simple supracondylar fracture without intercondylar fracture of right humerus, subsequent encounter for fracture with malunion: Secondary | ICD-10-CM | POA: Insufficient documentation

## 2014-07-22 DIAGNOSIS — M7989 Other specified soft tissue disorders: Secondary | ICD-10-CM

## 2014-07-22 DIAGNOSIS — E119 Type 2 diabetes mellitus without complications: Secondary | ICD-10-CM | POA: Diagnosis not present

## 2014-07-22 DIAGNOSIS — Z8781 Personal history of (healed) traumatic fracture: Secondary | ICD-10-CM | POA: Diagnosis not present

## 2014-07-22 DIAGNOSIS — I129 Hypertensive chronic kidney disease with stage 1 through stage 4 chronic kidney disease, or unspecified chronic kidney disease: Secondary | ICD-10-CM | POA: Insufficient documentation

## 2014-07-22 DIAGNOSIS — S42401P Unspecified fracture of lower end of right humerus, subsequent encounter for fracture with malunion: Secondary | ICD-10-CM

## 2014-07-22 DIAGNOSIS — Z8614 Personal history of Methicillin resistant Staphylococcus aureus infection: Secondary | ICD-10-CM | POA: Diagnosis not present

## 2014-07-22 DIAGNOSIS — G40909 Epilepsy, unspecified, not intractable, without status epilepticus: Secondary | ICD-10-CM | POA: Insufficient documentation

## 2014-07-22 DIAGNOSIS — Z880 Allergy status to penicillin: Secondary | ICD-10-CM | POA: Diagnosis not present

## 2014-07-22 DIAGNOSIS — G894 Chronic pain syndrome: Secondary | ICD-10-CM | POA: Insufficient documentation

## 2014-07-22 DIAGNOSIS — Z87891 Personal history of nicotine dependence: Secondary | ICD-10-CM | POA: Diagnosis not present

## 2014-07-22 DIAGNOSIS — N183 Chronic kidney disease, stage 3 (moderate): Secondary | ICD-10-CM | POA: Diagnosis not present

## 2014-07-22 DIAGNOSIS — Q899 Congenital malformation, unspecified: Secondary | ICD-10-CM

## 2014-07-22 DIAGNOSIS — Z794 Long term (current) use of insulin: Secondary | ICD-10-CM | POA: Insufficient documentation

## 2014-07-22 DIAGNOSIS — G629 Polyneuropathy, unspecified: Secondary | ICD-10-CM | POA: Insufficient documentation

## 2014-07-22 DIAGNOSIS — M79601 Pain in right arm: Secondary | ICD-10-CM | POA: Diagnosis present

## 2014-07-22 DIAGNOSIS — X58XXXD Exposure to other specified factors, subsequent encounter: Secondary | ICD-10-CM | POA: Diagnosis not present

## 2014-07-22 LAB — CBC WITH DIFFERENTIAL/PLATELET
BASOS ABS: 0 10*3/uL (ref 0.0–0.1)
Basophils Relative: 1 % (ref 0–1)
Eosinophils Absolute: 0.1 10*3/uL (ref 0.0–0.7)
Eosinophils Relative: 4 % (ref 0–5)
HEMATOCRIT: 28.1 % — AB (ref 36.0–46.0)
Hemoglobin: 9.2 g/dL — ABNORMAL LOW (ref 12.0–15.0)
LYMPHS ABS: 1.1 10*3/uL (ref 0.7–4.0)
LYMPHS PCT: 43 % (ref 12–46)
MCH: 28.9 pg (ref 26.0–34.0)
MCHC: 32.7 g/dL (ref 30.0–36.0)
MCV: 88.4 fL (ref 78.0–100.0)
MONO ABS: 0.2 10*3/uL (ref 0.1–1.0)
Monocytes Relative: 6 % (ref 3–12)
NEUTROS ABS: 1.3 10*3/uL — AB (ref 1.7–7.7)
Neutrophils Relative %: 47 % (ref 43–77)
Platelets: 169 10*3/uL (ref 150–400)
RBC: 3.18 MIL/uL — AB (ref 3.87–5.11)
RDW: 16.8 % — AB (ref 11.5–15.5)
WBC: 2.7 10*3/uL — AB (ref 4.0–10.5)

## 2014-07-22 LAB — I-STAT CHEM 8, ED
BUN: 55 mg/dL — ABNORMAL HIGH (ref 6–23)
CHLORIDE: 105 meq/L (ref 96–112)
Calcium, Ion: 1.16 mmol/L (ref 1.12–1.23)
Creatinine, Ser: 5 mg/dL — ABNORMAL HIGH (ref 0.50–1.10)
Glucose, Bld: 228 mg/dL — ABNORMAL HIGH (ref 70–99)
HCT: 31 % — ABNORMAL LOW (ref 36.0–46.0)
Hemoglobin: 10.5 g/dL — ABNORMAL LOW (ref 12.0–15.0)
Potassium: 5.2 mEq/L (ref 3.7–5.3)
Sodium: 132 mEq/L — ABNORMAL LOW (ref 137–147)
TCO2: 19 mmol/L (ref 0–100)

## 2014-07-22 MED ORDER — OXYCODONE-ACETAMINOPHEN 10-325 MG PO TABS: 1.0000 | ORAL_TABLET | Freq: Four times a day (QID) | ORAL | Status: DC | PRN

## 2014-07-22 MED ORDER — SULFAMETHOXAZOLE-TRIMETHOPRIM 800-160 MG PO TABS: 1.0000 | ORAL_TABLET | Freq: Two times a day (BID) | ORAL | Status: DC

## 2014-07-22 NOTE — ED Notes (Signed)
Vascular lab called aware patient is waiting for study

## 2014-07-22 NOTE — Progress Notes (Signed)
  VASCULAR LAB PRELIMINARY  PRELIMINARY  PRELIMINARY  PRELIMINARY  Right lower extremity venous duplex completed.    Preliminary report:  Right:  No evidence of DVT, superficial thrombosis, or Baker's cyst.  Lindwood Coke, RVT 07/22/2014, 9:57 AM

## 2014-07-22 NOTE — ED Provider Notes (Signed)
CSN: [id number]     Arrival date & time 07/22/14  0259 History   First MD Initiated Contact with Patient 07/22/14 0324     Chief Complaint  Patient presents with  . Arm Pain     (Consider location/radiation/quality/duration/timing/severity/associated sxs/prior Treatment) HPI Comments: Pt comes in with cc of arm pain. Pt has hx of DM, neuropathy and other medical problems. She reports breaking her arms 3 months ago. Pt has been seen by Ortho service, while she was admitted for cellulitis, and has been referred to Dr. Mardelle Matte. She is to see him on Monday. Pt reports that her pain is worse, she is running out of her pain meds, and she wants the surgery to be done today. She has no n/v/f/c. Also reports "lump" in her leg - and she was just treated for cellulitis.  Patient is a 48 y.o. female presenting with arm pain. The history is provided by the patient.  Arm Pain Pertinent negatives include no chest pain, no abdominal pain, no headaches and no shortness of breath.    Past Medical History  Diagnosis Date  . Neuropathy   . Glaucoma   . Bipolar 1 disorder   . CHF (congestive heart failure)   . Refusal of blood transfusions as patient is Jehovah's Witness   . Hypertension   . Shortness of breath   . TIA (transient ischemic attack) 2010  . Neuromuscular disorder     DIABETIC NEUROPATHY  . MRSA infection ?2014    "on the back of my head; spread throughout my bloodstream"  . Bipolar disorder, unspecified 02/06/2014  . High cholesterol   . Type II diabetes mellitus     INSULIN DEPENDENT  . Hepatitis C   . Seizure dx'd 2014    "don't know what kind; last one was ~ 04/2014"  . Chronic pain syndrome   . Chronic kidney disease (CKD), stage III (moderate)   . Schizoaffective disorder, unspecified condition 02/08/2014   Past Surgical History  Procedure Laterality Date  . Amputation Left 05/21/2013    Procedure: Left Midfoot AMPUTATION;  Surgeon: Newt Minion, MD;  Location: [facility];   Service: Orthopedics;  Laterality: Left;  Left Midfoot Amputation  . Tubal ligation    . Colonoscopy with propofol N/A 06/22/2013    Procedure: COLONOSCOPY WITH PROPOFOL;  Surgeon: Jeryl Columbia, MD;  Location: [facility];  Service: Endoscopy;  Laterality: N/A;  . Abdominal hysterectomy  2014  . Tonsillectomy  1970's  . Refractive surgery Bilateral 2013   Family History  Problem Relation Age of Onset  . Hypertension Mother   . Hypertension Father    History  Substance Use Topics  . Smoking status: Former Smoker -- 0.25 packs/day for 30 years    Types: Cigarettes    Quit date: 06/17/2014  . Smokeless tobacco: Never Used     Comment: "stopped smoking in ~ 2013; restarted last week; stopped again 06/17/2014"  . Alcohol Use: 18.0 oz/week    30 Cans of beer per week     Comment: 06/22/2014 "2-3, 40oz beers; 3 days/wk"   OB History    No data available     Review of Systems  Constitutional: Negative for activity change.  Respiratory: Negative for shortness of breath.   Cardiovascular: Negative for chest pain.  Gastrointestinal: Negative for nausea, vomiting and abdominal pain.  Genitourinary: Negative for dysuria.  Musculoskeletal: Positive for arthralgias. Negative for neck pain.  Neurological: Negative for headaches.      Allergies  Gabapentin; Saphris; Tramadol; and Penicillins  Home Medications   Prior to Admission medications   Medication Sig Start Date End Date Taking? Authorizing Provider  bisacodyl (DULCOLAX) 5 MG EC tablet Take 2 tablets (10 mg total) by mouth daily. 07/10/14  Yes Thurnell Lose, MD  carvedilol (COREG) 25 MG tablet Take 1 tablet (25 mg total) by mouth 2 (two) times daily. 06/24/14  Yes Debbe Odea, MD  cloNIDine (CATAPRES) 0.1 MG tablet Take 1 tablet (0.1 mg total) by mouth 3 (three) times daily as needed (for hypertension). 06/24/14  Yes Debbe Odea, MD  DULoxetine (CYMBALTA) 30 MG capsule Take 1 capsule (30 mg total) by mouth daily. 06/25/14   Yes Debbe Odea, MD  ferrous sulfate 325 (65 FE) MG tablet Take 1 tablet (325 mg total) by mouth daily with breakfast. 06/24/14  Yes Debbe Odea, MD  folic acid (FOLVITE) A999333 MCG tablet Take 400 mcg by mouth every morning.   Yes Historical Provider, MD  furosemide (LASIX) 80 MG tablet Take 1 tablet (80 mg total) by mouth 2 (two) times daily. 07/10/14  Yes Thurnell Lose, MD  insulin aspart (NOVOLOG) 100 UNIT/ML injection Inject 2-10 Units into the skin 3 (three) times daily before meals. SSI: 0-200 = 2 units, 201-250 = 4 units, 251-300 = 6 units, 301-350 = 8 units, 351-400 = 10 units   Yes Historical Provider, MD  insulin glargine (LANTUS) 100 UNIT/ML injection Inject 0.2 mLs (20 Units total) into the skin at bedtime. 06/25/14  Yes Debbe Odea, MD  levETIRAcetam (KEPPRA) 500 MG tablet Take 1 tablet (500 mg total) by mouth 2 (two) times daily. 06/24/14  Yes Debbe Odea, MD  Multiple Vitamins-Minerals (SENIOR MULTIVITAMIN PLUS PO) Take 1 tablet by mouth every morning.   Yes Historical Provider, MD  oxyCODONE-acetaminophen (PERCOCET/ROXICET) 5-325 MG per tablet Take 1 tablet by mouth every 6 (six) hours as needed for moderate pain. 07/10/14  Yes Thurnell Lose, MD  polyethylene glycol (MIRALAX / GLYCOLAX) packet Take 17 g by mouth 2 (two) times daily.   Yes Historical Provider, MD  silver sulfADIAZINE (SILVADENE) 1 % cream Apply topically daily. 04/14/14  Yes Nishant Dhungel, MD  simethicone (MYLICON) 80 MG chewable tablet Chew 80 mg by mouth every 8 (eight) hours as needed for flatulence.   Yes Historical Provider, MD  Elastic Bandages & Supports (T.E.D. KNEE LENGTH/S-REGULAR) MISC 1 each by Does not apply route daily. Patient not taking: Reported on 07/05/2014 06/25/14   Debbe Odea, MD  oxyCODONE-acetaminophen (PERCOCET) 10-325 MG per tablet Take 1 tablet by mouth every 6 (six) hours as needed for pain. 07/22/14   Varney Biles, MD  sulfamethoxazole-trimethoprim (BACTRIM DS,SEPTRA DS) 800-160  MG per tablet Take 1 tablet by mouth 2 (two) times daily. 07/22/14   Jonpaul Lumm, MD   BP 159/94 mmHg  Pulse 83  Temp(Src) 97.7 F (36.5 C) (Oral)  Resp 14  SpO2 100% Physical Exam  Constitutional: She is oriented to person, place, and time. She appears well-developed and well-nourished.  HENT:  Head: Normocephalic and atraumatic.  Eyes: EOM are normal. Pupils are equal, round, and reactive to light.  Neck: Neck supple.  Cardiovascular: Normal rate, regular rhythm and normal heart sounds.   No murmur heard. Pulmonary/Chest: Effort normal. No respiratory distress.  Abdominal: Soft. She exhibits no distension. There is no tenderness. There is no rebound and no guarding.  Musculoskeletal:  Pt has R elbow swelling. The joint has no erythema, or callor.  Also, RLE swelling appreciated, no  warmth appreciated.  Neurological: She is alert and oriented to person, place, and time.  Skin: Skin is warm and dry.  Nursing note and vitals reviewed.   ED Course  Procedures (including critical care time) Labs Review Labs Reviewed  CBC WITH DIFFERENTIAL - Abnormal; Notable for the following:    WBC 2.7 (*)    RBC 3.18 (*)    Hemoglobin 9.2 (*)    HCT 28.1 (*)    RDW 16.8 (*)    Neutro Abs 1.3 (*)    All other components within normal limits  I-STAT CHEM 8, ED - Abnormal; Notable for the following:    Sodium 132 (*)    BUN 55 (*)    Creatinine, Ser 5.00 (*)    Glucose, Bld 228 (*)    Hemoglobin 10.5 (*)    HCT 31.0 (*)    All other components within normal limits    Imaging Review Dg Elbow Complete Right  07/22/2014   CLINICAL DATA:  Root elbow deformity  EXAM: RIGHT ELBOW - COMPLETE 3+ VIEW  COMPARISON:  07/07/2014  FINDINGS: There is a 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 There is unchanged lateral displacement of the distal fragment by 1 cm. There is posterior displacement which is also similar to prior. Swelling and elbow  joint effusion is present. These changes could all be related to lack of immobilization, although bone infection cannot be excluded given extensive demineralization.  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 Given the marked demineralization, consider the possibility of associated infection.   Electronically Signed   By: Jorje Guild M.D.   On: 07/22/2014 04:06     EKG Interpretation None      MDM   Final diagnoses:  Elbow fracture, right, with malunion, subsequent encounter  Right leg swelling    Pt comes in with cc of R arm pain and leg lump. She has no signs of infection on exam. The Xray ordered from triage show malunion. Pt has 0 SIRS ans she has no fevers - further making this unlikely to be an infectious process. Pt has RLE swelling. [place] ordered. Further review shows that she just had an ultrasound last week ,and was neg for DVT. Pt advised to see Dr. Mardelle Matte on Monday. i spoke with ortho on call, and they absolutely agree that pt will not be be taken to the OR today. Pt provided her home pain meds.  Varney Biles, MD 07/22/14 [phone]

## 2014-07-22 NOTE — ED Notes (Signed)
Patient sitting on side of stretcher aware she is waiting  Vascular study

## 2014-07-22 NOTE — ED Notes (Signed)
MD at bedside. 

## 2014-07-22 NOTE — Discharge Instructions (Signed)
See Dr. Mardelle Matte on Monday. Take the antibiotics and pain meds as prescribed.  Cellulitis Cellulitis is an infection of the skin and the tissue beneath it. The infected area is usually red and tender. Cellulitis occurs most often in the arms and lower legs.  CAUSES  Cellulitis is caused by bacteria that enter the skin through cracks or cuts in the skin. The most common types of bacteria that cause cellulitis are staphylococci and streptococci. SIGNS AND SYMPTOMS   Redness and warmth.  Swelling.  Tenderness or pain.  Fever. DIAGNOSIS  Your health care provider can usually determine what is wrong based on a physical exam. Blood tests may also be done. TREATMENT  Treatment usually involves taking an antibiotic medicine. HOME CARE INSTRUCTIONS   Take your antibiotic medicine as directed by your health care provider. Finish the antibiotic even if you start to feel better.  Keep the infected arm or leg elevated to reduce swelling.  Apply a warm cloth to the affected area up to 4 times per day to relieve pain.  Take medicines only as directed by your health care provider.  Keep all follow-up visits as directed by your health care provider. SEEK MEDICAL CARE IF:   You notice red streaks coming from the infected area.  Your red area gets larger or turns dark in color.  Your bone or joint underneath the infected area becomes painful after the skin has healed.  Your infection returns in the same area or another area.  You notice a swollen bump in the infected area.  You develop new symptoms.  You have a fever. SEEK IMMEDIATE MEDICAL CARE IF:   You feel very sleepy.  You develop vomiting or diarrhea.  You have a general ill feeling (malaise) with muscle aches and pains. MAKE SURE YOU:   Understand these instructions.  Will watch your condition.  Will get help right away if you are not doing well or get worse. Document Released: 05/01/2005 Document Revised: 12/06/2013  Document Reviewed: 10/07/2011 [facility] Patient Information 2015 [place], [place]. This information is not intended to replace advice given to you by your health care provider. Make sure you discuss any questions you have with your health care provider.

## 2014-07-22 NOTE — ED Notes (Signed)
Vascular called and states that order for doppler was d/c and a note cannot be made on pt acct. Order put in for vascular tech to chart.

## 2014-07-22 NOTE — ED Notes (Addendum)
Patient broke arm 3 months ago.  Patient has deformity with right elbow and pain increased.  Patient states that she was not able to have surgery on arm when she initially broke it.  Patient also states that she has a migraine and she might have had a seizure yesterday.

## 2014-07-22 NOTE — ED Notes (Signed)
MD informed of pt's request for pain medication.

## 2014-08-09 ENCOUNTER — Emergency Department (HOSPITAL_COMMUNITY): Payer: Medicare Other

## 2014-08-09 ENCOUNTER — Emergency Department (HOSPITAL_COMMUNITY)
Admission: EM | Admit: 2014-08-09 | Discharge: 2014-08-09 | Disposition: A | Discharge status: Home / Self Care | Payer: Medicare Other | Attending: Emergency Medicine | Admitting: Emergency Medicine

## 2014-08-09 ENCOUNTER — Encounter (HOSPITAL_COMMUNITY): Payer: Self-pay

## 2014-08-09 DIAGNOSIS — R609 Edema, unspecified: Secondary | ICD-10-CM | POA: Insufficient documentation

## 2014-08-09 DIAGNOSIS — Z794 Long term (current) use of insulin: Secondary | ICD-10-CM | POA: Diagnosis not present

## 2014-08-09 DIAGNOSIS — G894 Chronic pain syndrome: Secondary | ICD-10-CM | POA: Diagnosis not present

## 2014-08-09 DIAGNOSIS — N183 Chronic kidney disease, stage 3 (moderate): Secondary | ICD-10-CM | POA: Insufficient documentation

## 2014-08-09 DIAGNOSIS — I129 Hypertensive chronic kidney disease with stage 1 through stage 4 chronic kidney disease, or unspecified chronic kidney disease: Secondary | ICD-10-CM | POA: Diagnosis not present

## 2014-08-09 DIAGNOSIS — Z8673 Personal history of transient ischemic attack (TIA), and cerebral infarction without residual deficits: Secondary | ICD-10-CM | POA: Insufficient documentation

## 2014-08-09 DIAGNOSIS — R0602 Shortness of breath: Secondary | ICD-10-CM

## 2014-08-09 DIAGNOSIS — Z79899 Other long term (current) drug therapy: Secondary | ICD-10-CM | POA: Insufficient documentation

## 2014-08-09 DIAGNOSIS — Z8614 Personal history of Methicillin resistant Staphylococcus aureus infection: Secondary | ICD-10-CM | POA: Diagnosis not present

## 2014-08-09 DIAGNOSIS — F319 Bipolar disorder, unspecified: Secondary | ICD-10-CM | POA: Diagnosis not present

## 2014-08-09 DIAGNOSIS — E1140 Type 2 diabetes mellitus with diabetic neuropathy, unspecified: Secondary | ICD-10-CM | POA: Insufficient documentation

## 2014-08-09 DIAGNOSIS — M79601 Pain in right arm: Secondary | ICD-10-CM | POA: Diagnosis not present

## 2014-08-09 DIAGNOSIS — N189 Chronic kidney disease, unspecified: Secondary | ICD-10-CM

## 2014-08-09 DIAGNOSIS — Z880 Allergy status to penicillin: Secondary | ICD-10-CM | POA: Diagnosis not present

## 2014-08-09 DIAGNOSIS — I509 Heart failure, unspecified: Secondary | ICD-10-CM | POA: Insufficient documentation

## 2014-08-09 DIAGNOSIS — Z8619 Personal history of other infectious and parasitic diseases: Secondary | ICD-10-CM | POA: Diagnosis not present

## 2014-08-09 DIAGNOSIS — Z87891 Personal history of nicotine dependence: Secondary | ICD-10-CM | POA: Diagnosis not present

## 2014-08-09 LAB — CBG MONITORING, ED
GLUCOSE-CAPILLARY: 384 mg/dL — AB (ref 70–99)
Glucose-Capillary: 500 mg/dL — ABNORMAL HIGH (ref 70–99)

## 2014-08-09 LAB — BASIC METABOLIC PANEL
ANION GAP: 7 (ref 5–15)
BUN: 32 mg/dL — ABNORMAL HIGH (ref 6–23)
CHLORIDE: 101 meq/L (ref 96–112)
CO2: 27 mmol/L (ref 19–32)
Calcium: 8.2 mg/dL — ABNORMAL LOW (ref 8.4–10.5)
Creatinine, Ser: 4.34 mg/dL — ABNORMAL HIGH (ref 0.50–1.10)
GFR calc Af Amer: 13 mL/min — ABNORMAL LOW (ref >90)
GFR calc non Af Amer: 11 mL/min — ABNORMAL LOW (ref >90)
Glucose, Bld: 467 mg/dL — ABNORMAL HIGH (ref 70–99)
Potassium: 3.6 mmol/L (ref 3.5–5.1)
SODIUM: 135 mmol/L (ref 135–145)

## 2014-08-09 LAB — CBC
HEMATOCRIT: 30.2 % — AB (ref 36.0–46.0)
Hemoglobin: 9.8 g/dL — ABNORMAL LOW (ref 12.0–15.0)
MCH: 28 pg (ref 26.0–34.0)
MCHC: 32.5 g/dL (ref 30.0–36.0)
MCV: 86.3 fL (ref 78.0–100.0)
Platelets: 179 10*3/uL (ref 150–400)
RBC: 3.5 MIL/uL — ABNORMAL LOW (ref 3.87–5.11)
RDW: 14.6 % (ref 11.5–15.5)
WBC: 2.3 10*3/uL — AB (ref 4.0–10.5)

## 2014-08-09 LAB — I-STAT TROPONIN, ED: TROPONIN I, POC: 0.01 ng/mL (ref 0.00–0.08)

## 2014-08-09 LAB — BRAIN NATRIURETIC PEPTIDE: B NATRIURETIC PEPTIDE 5: 490.4 pg/mL — AB (ref 0.0–100.0)

## 2014-08-09 MED ORDER — FUROSEMIDE 20 MG PO TABS
80.0000 mg | ORAL_TABLET | Freq: Once | ORAL | Status: AC
  Administered 2014-08-09: 80 mg via ORAL
  Filled 2014-08-09: qty 4

## 2014-08-09 MED ORDER — OXYCODONE-ACETAMINOPHEN 5-325 MG PO TABS
1.0000 | ORAL_TABLET | Freq: Once | ORAL | Status: AC
  Administered 2014-08-09: 1 via ORAL
  Filled 2014-08-09: qty 1

## 2014-08-09 MED ORDER — INSULIN ASPART 100 UNIT/ML ~~LOC~~ SOLN
12.0000 [IU] | Freq: Once | SUBCUTANEOUS | Status: AC
  Administered 2014-08-09: 12 [IU] via SUBCUTANEOUS
  Filled 2014-08-09: qty 1

## 2014-08-09 MED ORDER — FUROSEMIDE 40 MG PO TABS: 80.0000 mg | ORAL_TABLET | Freq: Two times a day (BID) | ORAL | Status: DC

## 2014-08-09 NOTE — ED Notes (Signed)
Patient is alert and orientedx4.  Patient was explained discharge instructions and they understood them with no questions.   

## 2014-08-09 NOTE — ED Notes (Signed)
[place] sandwich and crackers given

## 2014-08-09 NOTE — ED Notes (Addendum)
CBG is 384. Notified nurse Elmyra Ricks.

## 2014-08-09 NOTE — ED Notes (Signed)
CBG 500, Primary RN Elmyra Ricks aware. States will notify MD.

## 2014-08-09 NOTE — ED Notes (Signed)
Pt was at [facility] the other day. Had labs and sent here today for abnormal blood work. Pt has had pain all over d/t swelling in legs., feet, and arms. Reports some SOB when exerting herself today.

## 2014-08-09 NOTE — ED Notes (Signed)
Pt. Called for xray x2. No answer from patient.

## 2014-08-09 NOTE — ED Provider Notes (Signed)
CSN: [id number]     Arrival date & time 08/09/14  1216 History   First MD Initiated Contact with Patient 08/09/14 1600     Chief Complaint  Patient presents with  . Shortness of Breath  . Congestive Heart Failure     (Consider location/radiation/quality/duration/timing/severity/associated sxs/prior Treatment) The history is provided by the patient and medical records.   This is a 49 year old female with past medical history significant for diabetes, seizure disorder, hypertension, congestive heart failure, bipolar disorder, peripheral neuropathy, chronic kidney disease, presenting to the ED with multiple complaints.  First, patient complains of bilateral lower extremity edema. Patient states her legs feel very "heavy". Patient is on daily Lasix, 80 mg twice a day, but she has not taken this in the past 4 days because she ran out. Patient states earlier today she did have some exertional shortness of breath which quickly resolved with rest. States no SOB when not moving.  She denies any chest pain, dizziness, or lightheadedness. Patient is not on any type of home oxygen.  Secondly, patient complains of right elbow pain.  Patient states she broke her right humerus 3 months ago (displaced supracondylar humerus fracture). but they were unable to operate due to her low hemoglobin. As patient is Jehovah's Witness, she does not want any type of blood transfusion. She has been referred to orthopedics, Dr. Mardelle Matte, but has not followed up.  States she takes home pain medication but is still having issues with her mobility of her right arm.  Denies new injuries or trauma of right arm.  Past Medical History  Diagnosis Date  . Neuropathy   . Glaucoma   . Bipolar 1 disorder   . CHF (congestive heart failure)   . Refusal of blood transfusions as patient is Jehovah's Witness   . Hypertension   . Shortness of breath   . TIA (transient ischemic attack) 2010  . Neuromuscular disorder     DIABETIC NEUROPATHY   . MRSA infection ?2014    "on the back of my head; spread throughout my bloodstream"  . Bipolar disorder, unspecified 02/06/2014  . High cholesterol   . Type II diabetes mellitus     INSULIN DEPENDENT  . Hepatitis C   . Seizure dx'd 2014    "don't know what kind; last one was ~ 04/2014"  . Chronic pain syndrome   . Chronic kidney disease (CKD), stage III (moderate)   . Schizoaffective disorder, unspecified condition 02/08/2014   Past Surgical History  Procedure Laterality Date  . Amputation Left 05/21/2013    Procedure: Left Midfoot AMPUTATION;  Surgeon: Newt Minion, MD;  Location: [facility];  Service: Orthopedics;  Laterality: Left;  Left Midfoot Amputation  . Tubal ligation    . Colonoscopy with propofol N/A 06/22/2013    Procedure: COLONOSCOPY WITH PROPOFOL;  Surgeon: Jeryl Columbia, MD;  Location: [facility];  Service: Endoscopy;  Laterality: N/A;  . Abdominal hysterectomy  2014  . Tonsillectomy  1970's  . Refractive surgery Bilateral 2013   Family History  Problem Relation Age of Onset  . Hypertension Mother   . Hypertension Father    History  Substance Use Topics  . Smoking status: Former Smoker -- 0.25 packs/day for 30 years    Types: Cigarettes    Quit date: 06/17/2014  . Smokeless tobacco: Never Used     Comment: "stopped smoking in ~ 2013; restarted last week; stopped again 06/17/2014"  . Alcohol Use: 18.0 oz/week    30 Cans of beer  per week     Comment: 06/22/2014 "2-3, 40oz beers; 3 days/wk"   OB History    No data available     Review of Systems  Respiratory: Positive for shortness of breath.   Cardiovascular: Positive for leg swelling (edema).  Musculoskeletal: Positive for arthralgias.  All other systems reviewed and are negative.     Allergies  Gabapentin; Saphris; Tramadol; and Penicillins  Home Medications   Prior to Admission medications   Medication Sig Start Date End Date Taking? Authorizing Provider  bisacodyl (DULCOLAX) 5 MG EC tablet Take  2 tablets (10 mg total) by mouth daily. 07/10/14   Thurnell Lose, MD  carvedilol (COREG) 25 MG tablet Take 1 tablet (25 mg total) by mouth 2 (two) times daily. 06/24/14   Debbe Odea, MD  cloNIDine (CATAPRES) 0.1 MG tablet Take 1 tablet (0.1 mg total) by mouth 3 (three) times daily as needed (for hypertension). 06/24/14   Debbe Odea, MD  DULoxetine (CYMBALTA) 30 MG capsule Take 1 capsule (30 mg total) by mouth daily. 06/25/14   Debbe Odea, MD  Elastic Bandages & Supports (T.E.D. KNEE LENGTH/S-REGULAR) MISC 1 each by Does not apply route daily. Patient not taking: Reported on 07/05/2014 06/25/14   Debbe Odea, MD  ferrous sulfate 325 (65 FE) MG tablet Take 1 tablet (325 mg total) by mouth daily with breakfast. 06/24/14   Debbe Odea, MD  folic acid (FOLVITE) A999333 MCG tablet Take 400 mcg by mouth every morning.    Historical Provider, MD  furosemide (LASIX) 80 MG tablet Take 1 tablet (80 mg total) by mouth 2 (two) times daily. 07/10/14   Thurnell Lose, MD  insulin aspart (NOVOLOG) 100 UNIT/ML injection Inject 2-10 Units into the skin 3 (three) times daily before meals. SSI: 0-200 = 2 units, 201-250 = 4 units, 251-300 = 6 units, 301-350 = 8 units, 351-400 = 10 units    Historical Provider, MD  insulin glargine (LANTUS) 100 UNIT/ML injection Inject 0.2 mLs (20 Units total) into the skin at bedtime. 06/25/14   Debbe Odea, MD  levETIRAcetam (KEPPRA) 500 MG tablet Take 1 tablet (500 mg total) by mouth 2 (two) times daily. 06/24/14   Debbe Odea, MD  Multiple Vitamins-Minerals (SENIOR MULTIVITAMIN PLUS PO) Take 1 tablet by mouth every morning.    Historical Provider, MD  oxyCODONE-acetaminophen (PERCOCET) 10-325 MG per tablet Take 1 tablet by mouth every 6 (six) hours as needed for pain. 07/22/14   Varney Biles, MD  oxyCODONE-acetaminophen (PERCOCET/ROXICET) 5-325 MG per tablet Take 1 tablet by mouth every 6 (six) hours as needed for moderate pain. 07/10/14   Thurnell Lose, MD  polyethylene  glycol (MIRALAX / GLYCOLAX) packet Take 17 g by mouth 2 (two) times daily.    Historical Provider, MD  silver sulfADIAZINE (SILVADENE) 1 % cream Apply topically daily. 04/14/14   Nishant Dhungel, MD  simethicone (MYLICON) 80 MG chewable tablet Chew 80 mg by mouth every 8 (eight) hours as needed for flatulence.    Historical Provider, MD  sulfamethoxazole-trimethoprim (BACTRIM DS,SEPTRA DS) 800-160 MG per tablet Take 1 tablet by mouth 2 (two) times daily. 07/22/14   Ankit Nanavati, MD   BP 208/109 mmHg  Pulse 91  Temp(Src) 97.4 F (36.3 C) (Oral)  Resp 18  SpO2 100%   Physical Exam  Constitutional: She is oriented to person, place, and time. She appears well-developed and well-nourished. No distress.  HENT:  Head: Normocephalic and atraumatic.  Mouth/Throat: Oropharynx is clear and moist.  Eyes:  Conjunctivae and EOM are normal. Pupils are equal, round, and reactive to light.  Neck: Normal range of motion. Neck supple.  Cardiovascular: Normal rate, regular rhythm and normal heart sounds.   Pulmonary/Chest: Effort normal and breath sounds normal. No respiratory distress. She has no wheezes. She has no rhonchi. She has no rales.  Respirations unlabored, no wheezes or rhonchi, no rales, speaking in full complete sentences without difficulty  Abdominal: Soft. Bowel sounds are normal. There is no tenderness. There is no guarding.  Musculoskeletal: Normal range of motion. She exhibits edema.  Deformity of right elbow 1+ pitting edema BLE; DP pulses intact bilaterally  Neurological: She is alert and oriented to person, place, and time.  Skin: Skin is warm and dry. She is not diaphoretic.  Psychiatric: She has a normal mood and affect.  Nursing note and vitals reviewed.   ED Course  Procedures (including critical care time) Labs Review Labs Reviewed  CBC - Abnormal; Notable for the following:    WBC 2.3 (*)    RBC 3.50 (*)    Hemoglobin 9.8 (*)    HCT 30.2 (*)    All other components  within normal limits  BASIC METABOLIC PANEL - Abnormal; Notable for the following:    Glucose, Bld 467 (*)    BUN 32 (*)    Creatinine, Ser 4.34 (*)    Calcium 8.2 (*)    GFR calc non Af Amer 11 (*)    GFR calc Af Amer 13 (*)    All other components within normal limits  BRAIN NATRIURETIC PEPTIDE - Abnormal; Notable for the following:    B Natriuretic Peptide 490.4 (*)    All other components within normal limits  I-STAT TROPOININ, ED    Imaging Review Dg Chest 2 View  08/09/2014   CLINICAL DATA:  Shortness of breath for 1 week with bilateral leg swelling.  EXAM: CHEST  2 VIEW  COMPARISON:  06/22/2014.  FINDINGS: The cardiac silhouette, mediastinal and hilar contours are within normal limits and stable. The lungs are clear. No findings for CHF, focal infiltrate or pleural effusion. The bony thorax is intact.  IMPRESSION: No acute cardiopulmonary findings.   Electronically Signed   By: Kalman Jewels M.D.   On: 08/09/2014 15:45     EKG Interpretation   Date/Time:  Tuesday August 09 2014 12:27:54 EST Ventricular Rate:  86 PR Interval:  172 QRS Duration: 92 QT Interval:  402 QTC Calculation: 481 R Axis:   65 Text Interpretation:  Normal sinus rhythm Septal infarct , age  undetermined Abnormal ECG Sinus rhythm Sinus rhythm q waves anteriorly No  significant change since last tracing Abnormal ekg Confirmed by Carmin Muskrat  MD [phone]) on 08/09/2014 4:05:46 PM      MDM   Final diagnoses:  Peripheral edema  Chronic kidney disease, unspecified stage  Shortness of breath  Right arm pain   48 year old female with multiple complaints. Notably peripheral edema and exertional shortness of breath this morning. No reported chest pain. Patient has been off her daily lasix for the past 4 days.  Patient also complaining of right arm pain. She has a noted humeral fracture that has been deemed inoperable recently due to low hemoglobin and her current renal function. She still refuses blood  transfusions and dialysis.  Lab work today with elevated at 467-- states has been running high at home despite modifying diet, however when discussing her diet she has been eating a lot of fruit and fattening foods.  Renal function appears baseline.  BNP elevated at 490.  CXR without vascular congestion or pulmonary edema.  Troponin negative.  Patient given dose of lasix in the ED as well as insulin with improvement of CBG to <400.  Anion gap remains WNL at 7, clinically not DKA.  Patient has remained without SOB, labored breathing, or hypoxia in the ED.  BP remains elevated, patient has not taken her afternoon or evening meds, renal function also likely playing a role.  No signs of end organ damage today.  Feel she is stable for discharge.  I have encouraged her to FU with orthopedics regarding her arm as well as PCP regarding other issues.  She was also given referral to cardiology as she does not have one at this time.  Discussed plan with patient, he/she acknowledged understanding and agreed with plan of care.  Return precautions given for new or worsening symptoms.  Case discussed with attending physician, Dr. Vanita Panda, who evaluated patient and agrees with assessment and plan of care.  Larene Pickett, PA-C 08/09/14 [place], MD 08/09/14 [phone]

## 2014-08-09 NOTE — Discharge Instructions (Signed)
I have refilled your lasix, please take this as directed to help control your congestive heart failure and peripheral edema. Follow-up with your primary care physician-- lab work on back for their review. You may see cardiology if you wish-- contact info given. Follow-up with Dr. Mardelle Matte with orthopedics regarding your arm. Return to the ED for new or worsening symptoms.

## 2014-08-11 ENCOUNTER — Ambulatory Visit: Payer: Self-pay | Admitting: Cardiovascular Disease

## 2014-08-15 NOTE — Pre-Procedure Instructions (Addendum)
LETASHA CASTELO  08/15/2014   Your procedure is scheduled on:  Friday, January 22.            (Please call [phone] around 7:30-8:00 to get arrival time to hospital)   Report to [place] (off of [place].)              For any other questions, please call [phone], Monday - Friday 8 AM - 4 PM.                   Remember:   Do not eat food or drink liquids after midnight Thursday, January 21.    Take these medicines the morning of surgery with A SIP OF WATER: carvedilol (COREG), cloNIDine (CATAPRES), DULoxetine (CYMBALTA) levETIRAcetam (KEPPRA).             Take if needed: oxyCODONE-acetaminophen (PERCOCET/ROXICET).                DO not take Insulin the morning of surgery.   Do not wear jewelry, make-up or nail polish.  Do not wear lotions, powders, or perfumes.  Do not shave underarms & legs 48 hours prior to surgery.    Do not bring valuables to the hospital.               [facility] is not responsible  for any belongings or valuables.               Contacts, dentures or bridgework may not be worn into surgery.  Leave suitcase in the car. After surgery it may be brought to your room.  For patients admitted to the hospital, discharge time is determined by your treatment team.             Special Instructions: Review  [facility] - Preparing For Surgery.   Please read over the following fact sheets that you were given: Pain Booklet, Coughing and Deep Breathing and Surgical Site Infection Prevention

## 2014-08-16 ENCOUNTER — Encounter (HOSPITAL_COMMUNITY): Payer: Self-pay | Admitting: Vascular Surgery

## 2014-08-16 ENCOUNTER — Inpatient Hospital Stay (HOSPITAL_COMMUNITY)
Admission: RE | Admit: 2014-08-16 | Discharge: 2014-08-16 | Disposition: A | Discharge status: Home / Self Care | Payer: Self-pay | Source: Ambulatory Visit

## 2014-08-16 ENCOUNTER — Encounter (HOSPITAL_COMMUNITY): Payer: Self-pay

## 2014-08-16 NOTE — Progress Notes (Signed)
error 

## 2014-08-16 NOTE — Progress Notes (Addendum)
Anesthesia Note:  Patient is a 49 year old female scheduled for ulnar nerve transposition, repair of a humerus fracture with iliac autograft with osteotomy ulnar on 08/26/14 by Dr. Marchia Bond. PAT was scheduled for today, but she was a no show and has been rescheduled for tomorrow.  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 She was recently admitted to [facility] on 07/05/14 for RLE cellulitis and acute on chronic CKD stage IV. Notes indicate that she was to follow-up with nephrologist Dr. Mercy Moore. She was seen for an in-patient cardiology consult with Dr. Daneen Schick on 06/22/14 for acute on chronic diastolic CHF due to medication non-compliance. PCP is Dr. Maurice Small.   Meds include Cymbalta, Coreg, Catapress, 65 Fe, Folvite, Lasix, Novolog, Lantus, Keppra, Percocet.   08/09/14 EKG: NSR, septal infarct (age undetermined). No significant change since last tracing. Inferolateral T wave abnormality, improved.  04/22/14 echo: - Left ventricle: The cavity size was normal. There was moderate concentric hypertrophy. Systolic function was normal. The estimated ejection fraction was in the range of 55% to 60%. Wall motion was normal; there were no regional wall motion abnormalities. Doppler parameters are consistent with abnormal left ventricular relaxation (grade 1 diastolic dysfunction). - Aortic valve: Trileaflet; normal thickness leaflets. There was no regurgitation. - Mitral valve: Structurally normal valve. There was mild regurgitation. - Left atrium: The atrium was moderately dilated. - Right ventricle: Systolic function was normal. - Right atrium: The atrium was  normal in size. - Tricuspid valve: There was mild regurgitation. - Pulmonic valve: There was no regurgitation. - Pulmonary arteries: Systolic pressure was within the normal range. PA peak pressure: 35 mm Hg (S). - Pericardium, extracardiac: Mild circumferential pericardial effusion.  02/09/13 Carotid duplex: - The vertebral arteries appear patent with antegrade flow. - Findings consistent with less than 39 percent stenosis involving the right internal carotid artery and the left internal carotid artery. - ICA/CCA ratio. Right = 1.08. left = 0.74.  08/09/14 CXR: No acute cardiopulmonary findings.  She will get labs at her pending PAT visit. A1C on 07/05/14 was 8.3; although her glucose readings in the ED last week were 467 and 500.  During patient's ED visit on 08/19/14 (for right arm pain and requesting surgery that day, but ortho did not feel surgery was emergent) her BP was significantly elevated, most SBP > 200 and DBP > 100.  Her glucose was up to 500.  Her Cr was 4.34.  Labs and BP will be rechecked at PAT. I will go ahead and request PCP and nephrology records.  I left a voice message with Sherri at Dr. Luanna Cole office regarding anticipated need for medical clearance if her BP and labs were not more controlled when re-evaluated at PAT.  If she has not yet seen nephrology, that may need to be done as well.    [facility] Short Stay Center/Anesthesiology Phone [phone] 08/16/2014 2:28 PM  Addendum: Venida Jarvis at Dr. Luanna Cole office called this morning.  Apparently, patient was suppose to see cardiology on 08/11/13 but canceled the appointment. Dr. Etheleen Nicks office also reported that she was never seen at their out-patient clinic. (Her hospital follow-up visit with Dr. Orland Mustard on 07/22/14 indicates that she does not want dialysis and if that's all they are going to  recommend then she doesn't want to be seen there.) She is scheduled to see her PCP Dr. Justin Mend today at  10:45AM.  However, based on her recently missed appointments and uncontrolled hyperglycemia and hypertension, Dr. Mardelle Matte is planning to postpone surgery.  She has a non-union fracture since 04/2014, and ortho does not feel surgery is urgent at this point.    [facility] Hugh [facility] Short Stay Center/Anesthesiology Phone [phone] 08/17/2014 9:40 AM

## 2014-08-16 NOTE — Progress Notes (Signed)
Unable to get a hold of patient.  Her appt was for 1:00pm.   Time is now 1:30pm  Will try and reschedule her for another day.

## 2014-08-17 ENCOUNTER — Encounter (HOSPITAL_COMMUNITY)
Admission: RE | Admit: 2014-08-17 | Discharge: 2014-08-17 | Disposition: A | Discharge status: Home / Self Care | Payer: Medicare Other | Source: Ambulatory Visit | Attending: Orthopedic Surgery | Admitting: Orthopedic Surgery

## 2014-08-24 ENCOUNTER — Other Ambulatory Visit: Payer: Self-pay

## 2014-08-24 ENCOUNTER — Inpatient Hospital Stay (HOSPITAL_COMMUNITY)
Admission: EM | Admit: 2014-08-24 | Discharge: 2014-08-27 | DRG: 305 | Disposition: A | Discharge status: Home / Self Care | Payer: Medicare Other | Attending: Internal Medicine | Admitting: Internal Medicine

## 2014-08-24 ENCOUNTER — Telehealth (HOSPITAL_COMMUNITY): Payer: Self-pay | Admitting: Vascular Surgery

## 2014-08-24 ENCOUNTER — Emergency Department (HOSPITAL_COMMUNITY): Payer: Medicare Other

## 2014-08-24 ENCOUNTER — Encounter (HOSPITAL_COMMUNITY): Payer: Self-pay

## 2014-08-24 DIAGNOSIS — Z794 Long term (current) use of insulin: Secondary | ICD-10-CM

## 2014-08-24 DIAGNOSIS — Z9114 Patient's other noncompliance with medication regimen: Secondary | ICD-10-CM | POA: Diagnosis present

## 2014-08-24 DIAGNOSIS — F259 Schizoaffective disorder, unspecified: Secondary | ICD-10-CM | POA: Diagnosis present

## 2014-08-24 DIAGNOSIS — I5031 Acute diastolic (congestive) heart failure: Secondary | ICD-10-CM

## 2014-08-24 DIAGNOSIS — L03116 Cellulitis of left lower limb: Secondary | ICD-10-CM

## 2014-08-24 DIAGNOSIS — Z8673 Personal history of transient ischemic attack (TIA), and cerebral infarction without residual deficits: Secondary | ICD-10-CM

## 2014-08-24 DIAGNOSIS — F319 Bipolar disorder, unspecified: Secondary | ICD-10-CM | POA: Diagnosis present

## 2014-08-24 DIAGNOSIS — B1920 Unspecified viral hepatitis C without hepatic coma: Secondary | ICD-10-CM | POA: Diagnosis present

## 2014-08-24 DIAGNOSIS — L03115 Cellulitis of right lower limb: Secondary | ICD-10-CM

## 2014-08-24 DIAGNOSIS — E1122 Type 2 diabetes mellitus with diabetic chronic kidney disease: Secondary | ICD-10-CM | POA: Diagnosis present

## 2014-08-24 DIAGNOSIS — R079 Chest pain, unspecified: Secondary | ICD-10-CM

## 2014-08-24 DIAGNOSIS — Z87891 Personal history of nicotine dependence: Secondary | ICD-10-CM

## 2014-08-24 DIAGNOSIS — Z9119 Patient's noncompliance with other medical treatment and regimen: Secondary | ICD-10-CM

## 2014-08-24 DIAGNOSIS — I10 Essential (primary) hypertension: Secondary | ICD-10-CM | POA: Diagnosis not present

## 2014-08-24 DIAGNOSIS — G40909 Epilepsy, unspecified, not intractable, without status epilepticus: Secondary | ICD-10-CM

## 2014-08-24 DIAGNOSIS — Z899 Acquired absence of limb, unspecified: Secondary | ICD-10-CM

## 2014-08-24 DIAGNOSIS — N184 Chronic kidney disease, stage 4 (severe): Secondary | ICD-10-CM | POA: Diagnosis present

## 2014-08-24 DIAGNOSIS — E11628 Type 2 diabetes mellitus with other skin complications: Secondary | ICD-10-CM

## 2014-08-24 DIAGNOSIS — H409 Unspecified glaucoma: Secondary | ICD-10-CM | POA: Diagnosis present

## 2014-08-24 DIAGNOSIS — D509 Iron deficiency anemia, unspecified: Secondary | ICD-10-CM

## 2014-08-24 DIAGNOSIS — L089 Local infection of the skin and subcutaneous tissue, unspecified: Secondary | ICD-10-CM

## 2014-08-24 DIAGNOSIS — I5032 Chronic diastolic (congestive) heart failure: Secondary | ICD-10-CM

## 2014-08-24 DIAGNOSIS — N185 Chronic kidney disease, stage 5: Secondary | ICD-10-CM | POA: Diagnosis present

## 2014-08-24 DIAGNOSIS — Z531 Procedure and treatment not carried out because of patient's decision for reasons of belief and group pressure: Secondary | ICD-10-CM | POA: Diagnosis present

## 2014-08-24 DIAGNOSIS — I429 Cardiomyopathy, unspecified: Secondary | ICD-10-CM

## 2014-08-24 DIAGNOSIS — K625 Hemorrhage of anus and rectum: Secondary | ICD-10-CM

## 2014-08-24 DIAGNOSIS — Z91199 Patient's noncompliance with other medical treatment and regimen due to unspecified reason: Secondary | ICD-10-CM

## 2014-08-24 DIAGNOSIS — D61818 Other pancytopenia: Secondary | ICD-10-CM

## 2014-08-24 DIAGNOSIS — E876 Hypokalemia: Secondary | ICD-10-CM

## 2014-08-24 DIAGNOSIS — E1100 Type 2 diabetes mellitus with hyperosmolarity without nonketotic hyperglycemic-hyperosmolar coma (NKHHC): Secondary | ICD-10-CM

## 2014-08-24 DIAGNOSIS — D649 Anemia, unspecified: Secondary | ICD-10-CM

## 2014-08-24 DIAGNOSIS — G894 Chronic pain syndrome: Secondary | ICD-10-CM | POA: Diagnosis present

## 2014-08-24 DIAGNOSIS — E1140 Type 2 diabetes mellitus with diabetic neuropathy, unspecified: Secondary | ICD-10-CM | POA: Diagnosis present

## 2014-08-24 DIAGNOSIS — B182 Chronic viral hepatitis C: Secondary | ICD-10-CM

## 2014-08-24 DIAGNOSIS — R0789 Other chest pain: Secondary | ICD-10-CM

## 2014-08-24 LAB — CBC WITH DIFFERENTIAL/PLATELET
BASOS PCT: 0 % (ref 0–1)
Basophils Absolute: 0 10*3/uL (ref 0.0–0.1)
EOS ABS: 0.1 10*3/uL (ref 0.0–0.7)
EOS PCT: 2 % (ref 0–5)
HEMATOCRIT: 26.9 % — AB (ref 36.0–46.0)
Hemoglobin: 9.3 g/dL — ABNORMAL LOW (ref 12.0–15.0)
LYMPHS ABS: 1.7 10*3/uL (ref 0.7–4.0)
Lymphocytes Relative: 55 % — ABNORMAL HIGH (ref 12–46)
MCH: 29.4 pg (ref 26.0–34.0)
MCHC: 34.6 g/dL (ref 30.0–36.0)
MCV: 85.1 fL (ref 78.0–100.0)
MONO ABS: 0.2 10*3/uL (ref 0.1–1.0)
MONOS PCT: 8 % (ref 3–12)
NEUTROS PCT: 35 % — AB (ref 43–77)
Neutro Abs: 1.1 10*3/uL — ABNORMAL LOW (ref 1.7–7.7)
Platelets: 157 10*3/uL (ref 150–400)
RBC: 3.16 MIL/uL — AB (ref 3.87–5.11)
RDW: 14.4 % (ref 11.5–15.5)
WBC: 3.1 10*3/uL — ABNORMAL LOW (ref 4.0–10.5)

## 2014-08-24 MED ORDER — FUROSEMIDE 10 MG/ML IJ SOLN
80.0000 mg | Freq: Once | INTRAMUSCULAR | Status: AC
  Administered 2014-08-24: 80 mg via INTRAVENOUS
  Filled 2014-08-24: qty 8

## 2014-08-24 MED ORDER — ASPIRIN 81 MG PO CHEW
324.0000 mg | CHEWABLE_TABLET | Freq: Once | ORAL | Status: AC
  Administered 2014-08-24: 324 mg via ORAL

## 2014-08-24 MED ORDER — NITROGLYCERIN IN D5W 200-5 MCG/ML-% IV SOLN
10.0000 ug/min | INTRAVENOUS | Status: DC
  Administered 2014-08-24: 10 ug/min via INTRAVENOUS
  Filled 2014-08-24: qty 250

## 2014-08-24 MED ORDER — ASPIRIN 81 MG PO CHEW
324.0000 mg | CHEWABLE_TABLET | Freq: Once | ORAL | Status: DC
  Filled 2014-08-24: qty 4

## 2014-08-24 NOTE — Progress Notes (Signed)
Anesthesia update: I received a phone call from Dr. Maurice Small today [phone]).  She had seen patient who was reportedly more engaged and committed to getting her chronic medical conditions under control.  She is scheduled to see cardiology and nephrology within the next week or so. Dr. Justin Mend did make adjustments to her BP and DM regimen and plans to re-evaluate patient next Friday.  Dr. Justin Mend is planning to fax me her office note from today with CC: to Dr. Mardelle Matte as well. Timing of re-scheduling her surgery will depend on cardiology/nephrology recommendations and how she is doing at her follow-up with Dr. Maurice Small.   George Hugh [facility] Short Stay Center/Anesthesiology Phone [phone] 08/24/2014 1:19 PM

## 2014-08-24 NOTE — ED Notes (Signed)
Per EMS - pt from home, c/o chest pressure x2hrs with no relief. Pt took 80mg  lasix around 2100. Pt saw dr this morning about broken right arm; Pt reports swelling in legs that has worsened throughout the day. Pt c/o shortness of breath since yesterday that is relieved by sitting up. Pt hx of CHF, stage 3 kidney failure, no dialysis. Pt clear lung sounds, denies n/v/d, no cough, no abdominal distention, no facial edema. Pt compliant w/ BP meds, states MD increased BP meds this morning but did not fill prescription. VSS, NSR on monitor. CBG 320

## 2014-08-24 NOTE — ED Provider Notes (Signed)
Medical screening exam patient developed anterior chest pain described as pressure onset 1 hour prior to coming here. She reports that her legs have become more swollen over the past one week. She suffers from chronic leg swelling first several months. Presently discomfort in chest is minimal. No treatment prior to coming here brought by EMS on exam lungs clear auscultation heart regular rate and rhythm bilateral lower extremities with 3+ pretibial pitting edema  Orlie Dakin, MD 08/24/14 2306

## 2014-08-24 NOTE — ED Provider Notes (Signed)
CSN: [id number]     Arrival date & time 08/24/14  2240 History  This chart was scribed for Julianne Rice, MD by Delphia Grates, ED Scribe. This patient was seen in room B16C/B16C and the patient's care was started at 11:08 PM.    Chief Complaint  Patient presents with  . Chest Pain    Patient is a 49 y.o. female presenting with chest pain. The history is provided by the patient. No language interpreter was used.  Chest Pain Associated symptoms: shortness of breath   Associated symptoms: no abdominal pain, no back pain, no cough, no dizziness, no fever, no nausea, no numbness, no palpitations, not vomiting and no weakness     HPI Comments: [name] is a 49 y.o. female, with history of HTN, CHF, and DM, brought in by ambulance, who presents to the Emergency Department complaining of sudden onset left sided chest pain that began 1.5-2 hours ago. Patient describes the pain as presssure and states the pain is worse with lying down. There is associated SOB when lying down or ambulating. Patient notes chills, but has history of anemia and suspects this is related. Patient also notes leg swelling is currently on Lasix, but states this medication is no longer effective. She denies history of MI.    Past Medical History  Diagnosis Date  . Neuropathy   . Glaucoma   . Bipolar 1 disorder   . CHF (congestive heart failure)   . Refusal of blood transfusions as patient is Jehovah's Witness   . Hypertension   . Shortness of breath   . TIA (transient ischemic attack) 2010  . Neuromuscular disorder     DIABETIC NEUROPATHY  . MRSA infection ?2014    "on the back of my head; spread throughout my bloodstream"  . Bipolar disorder, unspecified 02/06/2014  . High cholesterol   . Type II diabetes mellitus     INSULIN DEPENDENT  . Hepatitis C   . Chronic pain syndrome   . Chronic kidney disease (CKD), stage III (moderate)   . Schizoaffective disorder, unspecified condition 02/08/2014  . Seizure dx'd  2014    "don't know what kind; last one was ~ 04/2014"   Past Surgical History  Procedure Laterality Date  . Amputation Left 05/21/2013    Procedure: Left Midfoot AMPUTATION;  Surgeon: Newt Minion, MD;  Location: [place] [facility];  Service: Orthopedics;  Laterality: Left;  Left Midfoot Amputation  . Tubal ligation    . Colonoscopy with propofol N/A 06/22/2013    Procedure: COLONOSCOPY WITH PROPOFOL;  Surgeon: Jeryl Columbia, MD;  Location: [facility];  Service: Endoscopy;  Laterality: N/A;  . Abdominal hysterectomy  2014  . Tonsillectomy  1970's  . Refractive surgery Bilateral 2013   Family History  Problem Relation Age of Onset  . Hypertension Mother   . Hypertension Father    History  Substance Use Topics  . Smoking status: Former Smoker -- 0.25 packs/day for 30 years    Types: Cigarettes    Quit date: 06/17/2014  . Smokeless tobacco: Never Used     Comment: "stopped smoking in ~ 2013; restarted last week; stopped again 06/17/2014"  . Alcohol Use: 18.0 oz/week    30 Cans of beer per week     Comment: 06/22/2014 "2-3, 40oz beers; 3 days/wk"   OB History    No data available     Review of Systems  Unable to perform ROS Constitutional: Positive for chills. Negative for fever.  Respiratory: Positive  for shortness of breath. Negative for cough.   Cardiovascular: Positive for chest pain and leg swelling. Negative for palpitations.  Gastrointestinal: Negative for nausea, vomiting and abdominal pain.  Musculoskeletal: Negative for back pain, neck pain and neck stiffness.  Skin: Negative for rash and wound.  Neurological: Negative for dizziness, weakness, light-headedness and numbness.  All other systems reviewed and are negative.     Allergies  Gabapentin; Saphris; Tramadol; and Penicillins  Home Medications   Prior to Admission medications   Medication Sig Start Date End Date Taking? Authorizing Provider  carvedilol (COREG) 25 MG tablet Take 1 tablet (25 mg total) by mouth 2  (two) times daily. 06/24/14  Yes Debbe Odea, MD  cloNIDine (CATAPRES) 0.1 MG tablet Take 1 tablet (0.1 mg total) by mouth 3 (three) times daily as needed (for hypertension). 06/24/14  Yes Debbe Odea, MD  ferrous sulfate 325 (65 FE) MG tablet Take 1 tablet (325 mg total) by mouth daily with breakfast. 06/24/14  Yes Debbe Odea, MD  furosemide (LASIX) 40 MG tablet Take 2 tablets (80 mg total) by mouth 2 (two) times daily. 08/09/14  Yes Larene Pickett, PA-C  insulin aspart (NOVOLOG) 100 UNIT/ML injection Inject 2-10 Units into the skin 3 (three) times daily before meals. SSI: 0-200 = 2 units, 201-250 = 4 units, 251-300 = 6 units, 301-350 = 8 units, 351-400 = 10 units   Yes Historical Provider, MD  insulin glargine (LANTUS) 100 UNIT/ML injection Inject 0.2 mLs (20 Units total) into the skin at bedtime. 06/25/14  Yes Debbe Odea, MD  levETIRAcetam (KEPPRA) 500 MG tablet Take 1 tablet (500 mg total) by mouth 2 (two) times daily. Patient taking differently: Take 1,000 mg by mouth at bedtime.  06/24/14  Yes Debbe Odea, MD  Multiple Vitamins-Minerals (SENIOR MULTIVITAMIN PLUS PO) Take 1 tablet by mouth every morning.   Yes Historical Provider, MD  polyethylene glycol (MIRALAX / GLYCOLAX) packet Take 17 g by mouth 2 (two) times daily.   Yes Historical Provider, MD  simethicone (MYLICON) 80 MG chewable tablet Chew 80 mg by mouth every 8 (eight) hours as needed for flatulence.   Yes Historical Provider, MD  bisacodyl (DULCOLAX) 5 MG EC tablet Take 2 tablets (10 mg total) by mouth daily. Patient not taking: Reported on 08/24/2014 07/10/14   Thurnell Lose, MD  DULoxetine (CYMBALTA) 30 MG capsule Take 1 capsule (30 mg total) by mouth daily. Patient not taking: Reported on 08/24/2014 06/25/14   Debbe Odea, MD  folic acid (FOLVITE) A999333 MCG tablet Take 400 mcg by mouth every morning.    Historical Provider, MD  furosemide (LASIX) 80 MG tablet Take 1 tablet (80 mg total) by mouth 2 (two) times daily. Patient  not taking: Reported on 08/24/2014 07/10/14   Thurnell Lose, MD  oxyCODONE-acetaminophen (PERCOCET) 10-325 MG per tablet Take 1 tablet by mouth every 6 (six) hours as needed for pain. Patient not taking: Reported on 08/09/2014 07/22/14   Varney Biles, MD  oxyCODONE-acetaminophen (PERCOCET/ROXICET) 5-325 MG per tablet Take 1 tablet by mouth every 6 (six) hours as needed for moderate pain. Patient not taking: Reported on 08/09/2014 07/10/14   Thurnell Lose, MD  silver sulfADIAZINE (SILVADENE) 1 % cream Apply topically daily. Patient not taking: Reported on 08/09/2014 04/14/14   Nishant Dhungel, MD  sulfamethoxazole-trimethoprim (BACTRIM DS,SEPTRA DS) 800-160 MG per tablet Take 1 tablet by mouth 2 (two) times daily. Patient not taking: Reported on 08/09/2014 07/22/14   Varney Biles, MD   Triage  Vitals: BP 210/102 mmHg  Pulse 86  Temp(Src) 98.1 F (36.7 C) (Oral)  Resp 16  Ht 5\' 4"  (1.626 m)  Wt 191 lb (86.637 kg)  BMI 32.77 kg/m2  SpO2 99%  Physical Exam  Constitutional: She is oriented to person, place, and time. She appears well-developed and well-nourished. No distress.  HENT:  Head: Normocephalic and atraumatic.  Mouth/Throat: Oropharynx is clear and moist.  Eyes: Conjunctivae and EOM are normal. Pupils are equal, round, and reactive to light.  Neck: Normal range of motion. Neck supple. No tracheal deviation present.  Cardiovascular: Normal rate and regular rhythm.  Exam reveals no gallop and no friction rub.   No murmur heard. Pulmonary/Chest: Effort normal and breath sounds normal. No respiratory distress. She has no wheezes. She has no rales.  Abdominal: Soft. Bowel sounds are normal. She exhibits no distension. There is no tenderness. There is no rebound and no guarding.  Musculoskeletal: Normal range of motion. She exhibits edema. She exhibits no tenderness.  Bilateral 3+ pitting edema. Partial left foot amputation.   Neurological: She is alert and oriented to person, place, and  time.  Moves all extremities without deficit. Sensation is intact.  Skin: Skin is warm and dry. No rash noted. No erythema.  Psychiatric: She has a normal mood and affect. Her behavior is normal.  Nursing note and vitals reviewed.   ED Course  Procedures (including critical care time)  DIAGNOSTIC STUDIES: Oxygen Saturation is 99% on room air, normal by my interpretation.    COORDINATION OF CARE: At 2312 Discussed treatment plan with patient. Patient agrees.   Labs Review Labs Reviewed  CBC WITH DIFFERENTIAL - Abnormal; Notable for the following:    WBC 3.1 (*)    RBC 3.16 (*)    Hemoglobin 9.3 (*)    HCT 26.9 (*)    Neutrophils Relative % 35 (*)    Neutro Abs 1.1 (*)    Lymphocytes Relative 55 (*)    All other components within normal limits  BASIC METABOLIC PANEL - Abnormal; Notable for the following:    Potassium 2.9 (*)    Glucose, Bld 274 (*)    BUN 38 (*)    Creatinine, Ser 4.09 (*)    Calcium 7.7 (*)    GFR calc non Af Amer 12 (*)    GFR calc Af Amer 14 (*)    All other components within normal limits  TROPONIN I    Imaging Review Dg Chest Port 1 View  08/24/2014   CLINICAL DATA:  Left-sided chest pain. Shortness of breath. History of CHF.  EXAM: PORTABLE CHEST - 1 VIEW  COMPARISON:  08/09/2014.  FINDINGS: Mild enlargement of the cardiac silhouette. Normal mediastinal and hilar contours.  Clear lungs.  No pleural effusion or pneumothorax.  Bony thorax is unremarkable.  IMPRESSION: No active disease.   Electronically Signed   By: Lajean Manes M.D.   On: 08/24/2014 23:20     EKG Interpretation   Date/Time:  Wednesday August 24 2014 22:53:44 EST Ventricular Rate:  85 PR Interval:  166 QRS Duration: 92 QT Interval:  420 QTC Calculation: 499 R Axis:   6 Text Interpretation:  Normal sinus rhythm Nonspecific T wave abnormality  Prolonged QT Abnormal ECG Confirmed by Lita Mains  MD, Sondos Wolfman ([id number]) on  08/24/2014 11:45:14 PM      MDM   Final diagnoses:   Chest pain    I personally performed the services described in this documentation, which was scribed in my presence.  The recorded information has been reviewed and is accurate.   Patient states chest pain is now resolved after NTG. Discussed with [facility] hospitalist and will admit.  Julianne Rice, MD 08/25/14 [id number]

## 2014-08-25 ENCOUNTER — Ambulatory Visit: Payer: Self-pay | Admitting: Cardiovascular Disease

## 2014-08-25 DIAGNOSIS — N184 Chronic kidney disease, stage 4 (severe): Secondary | ICD-10-CM

## 2014-08-25 DIAGNOSIS — Z794 Long term (current) use of insulin: Secondary | ICD-10-CM | POA: Diagnosis not present

## 2014-08-25 DIAGNOSIS — Z9114 Patient's other noncompliance with medication regimen: Secondary | ICD-10-CM | POA: Diagnosis present

## 2014-08-25 DIAGNOSIS — E1122 Type 2 diabetes mellitus with diabetic chronic kidney disease: Secondary | ICD-10-CM | POA: Diagnosis present

## 2014-08-25 DIAGNOSIS — E876 Hypokalemia: Secondary | ICD-10-CM

## 2014-08-25 DIAGNOSIS — Z01810 Encounter for preprocedural cardiovascular examination: Secondary | ICD-10-CM

## 2014-08-25 DIAGNOSIS — Z899 Acquired absence of limb, unspecified: Secondary | ICD-10-CM | POA: Diagnosis not present

## 2014-08-25 DIAGNOSIS — D61818 Other pancytopenia: Secondary | ICD-10-CM | POA: Diagnosis present

## 2014-08-25 DIAGNOSIS — F259 Schizoaffective disorder, unspecified: Secondary | ICD-10-CM | POA: Diagnosis present

## 2014-08-25 DIAGNOSIS — R079 Chest pain, unspecified: Secondary | ICD-10-CM | POA: Diagnosis present

## 2014-08-25 DIAGNOSIS — Z8673 Personal history of transient ischemic attack (TIA), and cerebral infarction without residual deficits: Secondary | ICD-10-CM | POA: Diagnosis not present

## 2014-08-25 DIAGNOSIS — E1140 Type 2 diabetes mellitus with diabetic neuropathy, unspecified: Secondary | ICD-10-CM | POA: Diagnosis present

## 2014-08-25 DIAGNOSIS — B1920 Unspecified viral hepatitis C without hepatic coma: Secondary | ICD-10-CM | POA: Diagnosis present

## 2014-08-25 DIAGNOSIS — I10 Essential (primary) hypertension: Secondary | ICD-10-CM | POA: Diagnosis present

## 2014-08-25 DIAGNOSIS — F319 Bipolar disorder, unspecified: Secondary | ICD-10-CM | POA: Diagnosis present

## 2014-08-25 DIAGNOSIS — Z531 Procedure and treatment not carried out because of patient's decision for reasons of belief and group pressure: Secondary | ICD-10-CM | POA: Diagnosis present

## 2014-08-25 DIAGNOSIS — G894 Chronic pain syndrome: Secondary | ICD-10-CM | POA: Diagnosis present

## 2014-08-25 DIAGNOSIS — Z87891 Personal history of nicotine dependence: Secondary | ICD-10-CM | POA: Diagnosis not present

## 2014-08-25 DIAGNOSIS — H409 Unspecified glaucoma: Secondary | ICD-10-CM | POA: Diagnosis present

## 2014-08-25 LAB — MRSA PCR SCREENING: MRSA by PCR: NEGATIVE

## 2014-08-25 LAB — BASIC METABOLIC PANEL
ANION GAP: 9 (ref 5–15)
Anion gap: 8 (ref 5–15)
BUN: 38 mg/dL — ABNORMAL HIGH (ref 6–23)
BUN: 37 mg/dL — AB (ref 6–23)
CALCIUM: 7.4 mg/dL — AB (ref 8.4–10.5)
CHLORIDE: 98 meq/L (ref 96–112)
CHLORIDE: 98 meq/L (ref 96–112)
CO2: 30 mmol/L (ref 19–32)
CO2: 28 mmol/L (ref 19–32)
Calcium: 7.7 mg/dL — ABNORMAL LOW (ref 8.4–10.5)
Creatinine, Ser: 4.09 mg/dL — ABNORMAL HIGH (ref 0.50–1.10)
Creatinine, Ser: 3.94 mg/dL — ABNORMAL HIGH (ref 0.50–1.10)
GFR calc Af Amer: 14 mL/min — ABNORMAL LOW (ref >90)
GFR calc Af Amer: 14 mL/min — ABNORMAL LOW (ref >90)
GFR calc non Af Amer: 12 mL/min — ABNORMAL LOW (ref >90)
GFR, EST NON AFRICAN AMERICAN: 12 mL/min — AB (ref >90)
GLUCOSE: 274 mg/dL — AB (ref 70–99)
Glucose, Bld: 475 mg/dL — ABNORMAL HIGH (ref 70–99)
Potassium: 2.9 mmol/L — ABNORMAL LOW (ref 3.5–5.1)
Potassium: 3.4 mmol/L — ABNORMAL LOW (ref 3.5–5.1)
SODIUM: 137 mmol/L (ref 135–145)
SODIUM: 134 mmol/L — AB (ref 135–145)

## 2014-08-25 LAB — TROPONIN I
TROPONIN I: 0.03 ng/mL (ref <0.031)
Troponin I: 0.03 ng/mL (ref <0.031)

## 2014-08-25 LAB — GLUCOSE, CAPILLARY
Glucose-Capillary: 362 mg/dL — ABNORMAL HIGH (ref 70–99)
Glucose-Capillary: 427 mg/dL — ABNORMAL HIGH (ref 70–99)
Glucose-Capillary: 289 mg/dL — ABNORMAL HIGH (ref 70–99)
Glucose-Capillary: 345 mg/dL — ABNORMAL HIGH (ref 70–99)
Glucose-Capillary: 312 mg/dL — ABNORMAL HIGH (ref 70–99)

## 2014-08-25 LAB — HEMOGLOBIN A1C
HEMOGLOBIN A1C: 9.8 % — AB (ref <5.7)
Mean Plasma Glucose: 235 mg/dL — ABNORMAL HIGH (ref <117)

## 2014-08-25 LAB — MAGNESIUM: Magnesium: 1.4 mg/dL — ABNORMAL LOW (ref 1.5–2.5)

## 2014-08-25 MED ORDER — POLYETHYLENE GLYCOL 3350 17 G PO PACK
17.0000 g | PACK | Freq: Two times a day (BID) | ORAL | Status: DC
  Administered 2014-08-25 – 2014-08-27 (×5): 17 g via ORAL
  Filled 2014-08-25 (×5): qty 1

## 2014-08-25 MED ORDER — ACETAMINOPHEN 325 MG PO TABS
650.0000 mg | ORAL_TABLET | ORAL | Status: DC | PRN
  Administered 2014-08-25 – 2014-08-26 (×3): 650 mg via ORAL
  Filled 2014-08-25 (×3): qty 2

## 2014-08-25 MED ORDER — FERROUS SULFATE 325 (65 FE) MG PO TABS
325.0000 mg | ORAL_TABLET | Freq: Every day | ORAL | Status: DC
  Administered 2014-08-25 – 2014-08-27 (×3): 325 mg via ORAL
  Filled 2014-08-25 (×3): qty 1

## 2014-08-25 MED ORDER — INSULIN GLARGINE 100 UNIT/ML ~~LOC~~ SOLN
20.0000 [IU] | Freq: Every day | SUBCUTANEOUS | Status: DC
  Filled 2014-08-25: qty 0.2

## 2014-08-25 MED ORDER — SENNOSIDES-DOCUSATE SODIUM 8.6-50 MG PO TABS
1.0000 | ORAL_TABLET | Freq: Two times a day (BID) | ORAL | Status: DC | PRN
  Administered 2014-08-25: 1 via ORAL
  Filled 2014-08-25: qty 1

## 2014-08-25 MED ORDER — LEVETIRACETAM 500 MG PO TABS
1000.0000 mg | ORAL_TABLET | Freq: Every day | ORAL | Status: DC
  Administered 2014-08-25 – 2014-08-26 (×2): 1000 mg via ORAL
  Filled 2014-08-25 (×2): qty 2

## 2014-08-25 MED ORDER — MORPHINE SULFATE 2 MG/ML IJ SOLN
2.0000 mg | INTRAMUSCULAR | Status: DC | PRN
  Administered 2014-08-26 (×2): 2 mg via INTRAVENOUS
  Filled 2014-08-25 (×3): qty 1

## 2014-08-25 MED ORDER — CLONIDINE HCL 0.2 MG PO TABS
0.2000 mg | ORAL_TABLET | Freq: Three times a day (TID) | ORAL | Status: DC
  Administered 2014-08-25 – 2014-08-27 (×6): 0.2 mg via ORAL
  Filled 2014-08-25 (×6): qty 1

## 2014-08-25 MED ORDER — CARVEDILOL 25 MG PO TABS
25.0000 mg | ORAL_TABLET | Freq: Two times a day (BID) | ORAL | Status: DC
  Administered 2014-08-25 – 2014-08-27 (×5): 25 mg via ORAL
  Filled 2014-08-25 (×5): qty 1

## 2014-08-25 MED ORDER — INSULIN GLARGINE 100 UNIT/ML ~~LOC~~ SOLN
20.0000 [IU] | Freq: Once | SUBCUTANEOUS | Status: AC
  Administered 2014-08-25: 20 [IU] via SUBCUTANEOUS
  Filled 2014-08-25: qty 0.2

## 2014-08-25 MED ORDER — FUROSEMIDE 10 MG/ML IJ SOLN
80.0000 mg | Freq: Two times a day (BID) | INTRAMUSCULAR | Status: DC
  Administered 2014-08-25 (×2): 80 mg via INTRAVENOUS
  Filled 2014-08-25 (×2): qty 8

## 2014-08-25 MED ORDER — FOLIC ACID 0.5 MG HALF TAB
400.0000 ug | ORAL_TABLET | Freq: Every morning | ORAL | Status: DC
  Administered 2014-08-25 – 2014-08-27 (×3): 0.5 mg via ORAL
  Filled 2014-08-25 (×4): qty 1

## 2014-08-25 MED ORDER — DOXYCYCLINE HYCLATE 100 MG PO TABS
100.0000 mg | ORAL_TABLET | Freq: Two times a day (BID) | ORAL | Status: DC
  Administered 2014-08-25: 100 mg via ORAL
  Filled 2014-08-25: qty 1

## 2014-08-25 MED ORDER — CLONIDINE HCL 0.2 MG PO TABS: 0.2000 mg | ORAL_TABLET | Freq: Three times a day (TID) | ORAL | Status: DC | PRN

## 2014-08-25 MED ORDER — MAGNESIUM SULFATE IN D5W 10-5 MG/ML-% IV SOLN
1.0000 g | Freq: Once | INTRAVENOUS | Status: AC
  Administered 2014-08-25: 1 g via INTRAVENOUS
  Filled 2014-08-25: qty 100

## 2014-08-25 MED ORDER — INSULIN GLARGINE 100 UNIT/ML ~~LOC~~ SOLN
20.0000 [IU] | Freq: Every day | SUBCUTANEOUS | Status: DC
  Administered 2014-08-25: 20 [IU] via SUBCUTANEOUS
  Filled 2014-08-25: qty 0.2

## 2014-08-25 MED ORDER — INSULIN ASPART 100 UNIT/ML ~~LOC~~ SOLN
0.0000 [IU] | Freq: Three times a day (TID) | SUBCUTANEOUS | Status: DC
  Administered 2014-08-25: 7 [IU] via SUBCUTANEOUS
  Administered 2014-08-25: 5 [IU] via SUBCUTANEOUS
  Administered 2014-08-25: 9 [IU] via SUBCUTANEOUS
  Administered 2014-08-26 (×3): 3 [IU] via SUBCUTANEOUS
  Administered 2014-08-27 (×2): 2 [IU] via SUBCUTANEOUS

## 2014-08-25 MED ORDER — ZOLPIDEM TARTRATE 5 MG PO TABS
5.0000 mg | ORAL_TABLET | Freq: Every evening | ORAL | Status: DC | PRN
  Administered 2014-08-26: 5 mg via ORAL
  Filled 2014-08-25: qty 1

## 2014-08-25 MED ORDER — POTASSIUM CHLORIDE CRYS ER 20 MEQ PO TBCR
20.0000 meq | EXTENDED_RELEASE_TABLET | Freq: Once | ORAL | Status: AC
  Administered 2014-08-25: 20 meq via ORAL
  Filled 2014-08-25: qty 1

## 2014-08-25 MED ORDER — HEPARIN SODIUM (PORCINE) 5000 UNIT/ML IJ SOLN
5000.0000 [IU] | Freq: Three times a day (TID) | INTRAMUSCULAR | Status: DC
  Administered 2014-08-25 – 2014-08-27 (×7): 5000 [IU] via SUBCUTANEOUS
  Filled 2014-08-25 (×6): qty 1

## 2014-08-25 MED ORDER — ONDANSETRON HCL 4 MG/2ML IJ SOLN: 4.0000 mg | Freq: Four times a day (QID) | INTRAMUSCULAR | Status: DC | PRN

## 2014-08-25 MED ORDER — CLONIDINE HCL 0.1 MG PO TABS
0.1000 mg | ORAL_TABLET | Freq: Three times a day (TID) | ORAL | Status: DC | PRN
  Administered 2014-08-25: 0.1 mg via ORAL
  Filled 2014-08-25: qty 1

## 2014-08-25 MED ORDER — GI COCKTAIL ~~LOC~~: 30.0000 mL | Freq: Four times a day (QID) | ORAL | Status: DC | PRN

## 2014-08-25 MED ORDER — ASPIRIN EC 325 MG PO TBEC
325.0000 mg | DELAYED_RELEASE_TABLET | Freq: Every day | ORAL | Status: DC
  Administered 2014-08-25 – 2014-08-27 (×3): 325 mg via ORAL
  Filled 2014-08-25 (×3): qty 1

## 2014-08-25 MED ORDER — POTASSIUM CHLORIDE CRYS ER 10 MEQ PO TBCR
30.0000 meq | EXTENDED_RELEASE_TABLET | ORAL | Status: AC
  Administered 2014-08-25: 30 meq via ORAL
  Filled 2014-08-25 (×2): qty 1

## 2014-08-25 NOTE — H&P (Signed)
[facility] History and Physical  [name] [id number] DOB: April 24, 1966 DOA: 08/24/2014  Referring physician: Julianne Rice, MD PCP: [name], MD   Chief Complaint: Chest Pain  HPI: [name] is a 49 y.o. female presents with chest pain. The patient states that the pain started about 3 hours ago. She states that she had just eaten and noted the pain started under the left breast. It did not appear to radiate. She states that she took her BP medications to see if that would help. She did not take an aspirin. She states that she has not had pain like this before. She states that she has a history of CHF. No prior history of heart attacks. 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 Patient is also a diabetic. The pain has now resolved.   Review of Systems:  Constitutional:  No weight loss, night sweats, Fevers, ++chills, ++fatigue.  HEENT:  No headaches, Difficulty swallowing  Cardio-vascular:  ++chest pain, no Orthopnea, PND, anasarca, palpitations  GI:  No heartburn, ++indigestion, no abdominal pain, nausea, vomiting  Resp:  ++shortness of breath with exertion. No coughing up of blood.No change in color of mucus.No wheezing Skin:  no rash or lesions GU:  no dysuria, change in color of urine, no urgency or frequency  Musculoskeletal:  No joint pain or swelling. No decreased range of motion  Psych:  No change in mood or affect. No depression or anxiety   Past Medical History  Diagnosis Date  . Neuropathy   . Glaucoma   . Bipolar 1 disorder   . CHF (congestive heart failure)   . Refusal of blood transfusions as patient is Jehovah's Witness   . Hypertension   . Shortness of breath   . TIA (transient ischemic attack) 2010  . Neuromuscular disorder     DIABETIC NEUROPATHY  . MRSA infection ?2014    "on the back of my head; spread throughout my bloodstream"  . Bipolar disorder, unspecified 02/06/2014  . High cholesterol   . Type II diabetes  mellitus     INSULIN DEPENDENT  . Hepatitis C   . Chronic pain syndrome   . Chronic kidney disease (CKD), stage III (moderate)   . Schizoaffective disorder, unspecified condition 02/08/2014  . Seizure dx'd 2014    "don't know what kind; last one was ~ 04/2014"   Past Surgical History  Procedure Laterality Date  . Amputation Left 05/21/2013    Procedure: Left Midfoot AMPUTATION;  Surgeon: Newt Minion, MD;  Location: [name];  Service: Orthopedics;  Laterality: Left;  Left Midfoot Amputation  . Tubal ligation    . Colonoscopy with propofol N/A 06/22/2013    Procedure: COLONOSCOPY WITH PROPOFOL;  Surgeon: Jeryl Columbia, MD;  Location: [facility];  Service: Endoscopy;  Laterality: N/A;  . Abdominal hysterectomy  2014  . Tonsillectomy  1970's  . Refractive surgery Bilateral 2013   Social History:  reports that she quit smoking about 2 months ago. Her smoking use included Cigarettes. She has a 7.5 pack-year smoking history. She has never used smokeless tobacco. She reports that she drinks about 18.0 oz of alcohol per week. She reports that she uses illicit drugs ("Crack" cocaine).  Allergies  Allergen Reactions  . Gabapentin Itching and Swelling  . Saphris [Asenapine] Swelling    Swelling of the face  . Tramadol Swelling    Causes legs to swell  . Penicillins Hives    Has tolerated Cefepime and  Ceftriaxone.    Family History  Problem Relation Age of Onset  . Hypertension Mother   . Hypertension Father      Prior to Admission medications   Medication Sig Start Date End Date Taking? Authorizing Provider  carvedilol (COREG) 25 MG tablet Take 1 tablet (25 mg total) by mouth 2 (two) times daily. 06/24/14  Yes Debbe Odea, MD  cloNIDine (CATAPRES) 0.1 MG tablet Take 1 tablet (0.1 mg total) by mouth 3 (three) times daily as needed (for hypertension). 06/24/14  Yes Debbe Odea, MD  ferrous sulfate 325 (65 FE) MG tablet Take 1 tablet (325 mg total) by mouth daily with breakfast. 06/24/14   Yes Debbe Odea, MD  furosemide (LASIX) 40 MG tablet Take 2 tablets (80 mg total) by mouth 2 (two) times daily. 08/09/14  Yes Larene Pickett, PA-C  insulin aspart (NOVOLOG) 100 UNIT/ML injection Inject 2-10 Units into the skin 3 (three) times daily before meals. SSI: 0-200 = 2 units, 201-250 = 4 units, 251-300 = 6 units, 301-350 = 8 units, 351-400 = 10 units   Yes Historical Provider, MD  insulin glargine (LANTUS) 100 UNIT/ML injection Inject 0.2 mLs (20 Units total) into the skin at bedtime. 06/25/14  Yes Debbe Odea, MD  levETIRAcetam (KEPPRA) 500 MG tablet Take 1 tablet (500 mg total) by mouth 2 (two) times daily. Patient taking differently: Take 1,000 mg by mouth at bedtime.  06/24/14  Yes Debbe Odea, MD  Multiple Vitamins-Minerals (SENIOR MULTIVITAMIN PLUS PO) Take 1 tablet by mouth every morning.   Yes Historical Provider, MD  polyethylene glycol (MIRALAX / GLYCOLAX) packet Take 17 g by mouth 2 (two) times daily.   Yes Historical Provider, MD  simethicone (MYLICON) 80 MG chewable tablet Chew 80 mg by mouth every 8 (eight) hours as needed for flatulence.   Yes Historical Provider, MD  bisacodyl (DULCOLAX) 5 MG EC tablet Take 2 tablets (10 mg total) by mouth daily. Patient not taking: Reported on 08/24/2014 07/10/14   Thurnell Lose, MD  DULoxetine (CYMBALTA) 30 MG capsule Take 1 capsule (30 mg total) by mouth daily. Patient not taking: Reported on 08/24/2014 06/25/14   Debbe Odea, MD  folic acid (FOLVITE) A999333 MCG tablet Take 400 mcg by mouth every morning.    Historical Provider, MD  furosemide (LASIX) 80 MG tablet Take 1 tablet (80 mg total) by mouth 2 (two) times daily. Patient not taking: Reported on 08/24/2014 07/10/14   Thurnell Lose, MD  oxyCODONE-acetaminophen (PERCOCET) 10-325 MG per tablet Take 1 tablet by mouth every 6 (six) hours as needed for pain. Patient not taking: Reported on 08/09/2014 07/22/14   Varney Biles, MD  oxyCODONE-acetaminophen (PERCOCET/ROXICET) 5-325 MG per  tablet Take 1 tablet by mouth every 6 (six) hours as needed for moderate pain. Patient not taking: Reported on 08/09/2014 07/10/14   Thurnell Lose, MD  silver sulfADIAZINE (SILVADENE) 1 % cream Apply topically daily. Patient not taking: Reported on 08/09/2014 04/14/14   Nishant Dhungel, MD  sulfamethoxazole-trimethoprim (BACTRIM DS,SEPTRA DS) 800-160 MG per tablet Take 1 tablet by mouth 2 (two) times daily. Patient not taking: Reported on 08/09/2014 07/22/14   Varney Biles, MD   Physical Exam: Filed Vitals:   08/24/14 2317 08/24/14 2345 08/25/14 0000 08/25/14 0015  BP: 206/100 175/101 180/96 182/99  Pulse:   85 92  Temp:      TempSrc:      Resp: 18  11 14   Height:      Weight:  SpO2: 99%  99% 99%    Wt Readings from Last 3 Encounters:  08/24/14 86.637 kg (191 lb)  08/17/14 81.693 kg (180 lb 1.6 oz)  07/09/14 83.915 kg (185 lb)    General:  Appears calm and comfortable Eyes: PERRL, normal lids, irises & conjunctiva ENT: grossly normal hearing, lips & tongue Neck: no LAD, masses or thyromegaly Cardiovascular: RRR, no m/r/g. ++LE edema. Respiratory: CTA bilaterally, no w/r/r. Normal respiratory effort. Abdomen: soft, ntnd Skin: no rash or induration seen on limited exam Musculoskeletal: grossly normal tone amputated left foot Psychiatric: grossly normal mood and affect, speech fluent and appropriate Neurologic: grossly non-focal.          Labs on Admission:  Basic Metabolic Panel:  Recent Labs Lab 08/24/14 2319  NA 137  K 2.9*  CL 98  CO2 30  GLUCOSE 274*  BUN 38*  CREATININE 4.09*  CALCIUM 7.7*   Liver Function Tests: No results for input(s): AST, ALT, ALKPHOS, BILITOT, PROT, ALBUMIN in the last 168 hours. No results for input(s): LIPASE, AMYLASE in the last 168 hours. No results for input(s): AMMONIA in the last 168 hours. CBC:  Recent Labs Lab 08/24/14 2319  WBC 3.1*  NEUTROABS 1.1*  HGB 9.3*  HCT 26.9*  MCV 85.1  PLT 157   Cardiac  Enzymes:  Recent Labs Lab 08/24/14 2319  TROPONINI <0.03    BNP (last 3 results)  Recent Labs  02/23/14 0224 04/12/14 0520 06/22/14 1532  PROBNP 2305.0* 12801.0* 28725.0*   CBG: No results for input(s): GLUCAP in the last 168 hours.  Radiological Exams on Admission: Dg Chest Port 1 View  08/24/2014   CLINICAL DATA:  Left-sided chest pain. Shortness of breath. History of CHF.  EXAM: PORTABLE CHEST - 1 VIEW  COMPARISON:  08/09/2014.  FINDINGS: Mild enlargement of the cardiac silhouette. Normal mediastinal and hilar contours.  Clear lungs.  No pleural effusion or pneumothorax.  Bony thorax is unremarkable.  IMPRESSION: No active disease.   Electronically Signed   By: Lajean Manes M.D.   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 Chest Pain -will admit to stepdown unit -patient was started on nitro drip due to pain and also due to high pressures -will check serial enzymes  2. Type 2 Diabetes Mellitus -will continue home medications -will check FSBS -check A1C  3. CKD Stage 4 -monitor labs -monitor fluids IOs  4. Hypokalemia -replace as needed  5. Accelerated Hypertension -will resume home medications -titrate NTG drip as needed    Code Status: Full Code (must indicate code status--if unknown or must be presumed, indicate so) DVT Prophylaxis:Heparin Family Communication: None (indicate person spoken with, if applicable, with phone number if by telephone) Disposition Plan: Home (indicate anticipated LOS)  Time spent: 69min  KHAN,SAADAT A [facility] Pager [phone]

## 2014-08-25 NOTE — Progress Notes (Signed)
Received referral for [facility] Management services. However, patient is already active with [facility]. Unfortunately, [name] has not been able to reach patient. Spoke with patient to make her aware that the [facility] has been trying to contact her on numerous occasions. She has even called her son, [name], to try to reach her. Patient endorses the fact that she has not had reliable telephone service prior but now she does. She states her new phone number is [phone] and that she will have this phone for the foreseeable future. Will make [place] aware of the new number. Patient will receive post hospital discharge call and will be evaluated for monthly home visits. Made inpatient RNCM aware [facility] is following.  [name], MSN- [facility] [phone]

## 2014-08-25 NOTE — Progress Notes (Signed)
UR completed 

## 2014-08-25 NOTE — Consult Note (Signed)
CONSULTATION NOTE  Reason for Consult: Chest pain, accelerated hypertension, pre-operative risk assessment  Requesting Physician: Dr. Eliseo Squires  Cardiologist: None (NEW)  HPI: This is a 49 y.o. female with a past medical history significant for bipolar 1 and schizoaffective disorders, insulin-dependent diabetes, hepatitis C, dyslipidemia, history of TIA, diabetic neuropathy, chronic anemia with refusal of blood transfusions due to her religious preference, chronic pain syndrome, stage 3-4 chronic kidney disease, glaucoma, and prior fractures including right arm with radial nonunion fracture and status post left midfoot amputation for infection. She has had a history of intermittent noncompliance with medications and reports that she "ran out of her medicines" recently leading to a presentation with hypertensive emergency. She presented with chest pain and systolic blood pressures over 200. She was short of breath and had heart failure symptoms. She was supposed to see Dr. Johnsie Cancel today as an outpatient, for preoperative evaluation prior to elective surgery for her right arm. Per her orthopedic surgeon, it is felt that this was not urgent as she's had symptoms for some time. She continues to believe that her right arm is painful and needs to be fixed. During her preoperative visit several days ago it was clear that she was not a good candidate for surgery and her procedure was canceled. She has not had a prior cardiovascular evaluation. Currently she denies any chest pain. She is on a nitroglycerin drip and blood is down to 144/87.  PMHx:  Past Medical History  Diagnosis Date  . Neuropathy   . Glaucoma   . Bipolar 1 disorder   . CHF (congestive heart failure)   . Refusal of blood transfusions as patient is Jehovah's Witness   . Hypertension   . Shortness of breath   . TIA (transient ischemic attack) 2010  . Neuromuscular disorder     DIABETIC NEUROPATHY  . MRSA infection ?2014    "on the  back of my head; spread throughout my bloodstream"  . Bipolar disorder, unspecified 02/06/2014  . High cholesterol   . Type II diabetes mellitus     INSULIN DEPENDENT  . Hepatitis C   . Chronic pain syndrome   . Chronic kidney disease (CKD), stage III (moderate)   . Schizoaffective disorder, unspecified condition 02/08/2014  . Seizure dx'd 2014    "don't know what kind; last one was ~ 04/2014"   Past Surgical History  Procedure Laterality Date  . Amputation Left 05/21/2013    Procedure: Left Midfoot AMPUTATION;  Surgeon: Newt Minion, MD;  Location: [facility];  Service: Orthopedics;  Laterality: Left;  Left Midfoot Amputation  . Tubal ligation    . Colonoscopy with propofol N/A 06/22/2013    Procedure: COLONOSCOPY WITH PROPOFOL;  Surgeon: Jeryl Columbia, MD;  Location: [facility];  Service: Endoscopy;  Laterality: N/A;  . Abdominal hysterectomy  2014  . Tonsillectomy  1970's  . Refractive surgery Bilateral 2013    FAMHx: Family History  Problem Relation Age of Onset  . Hypertension Mother   . Hypertension Father     SOCHx:  reports that she quit smoking about 2 months ago. Her smoking use included Cigarettes. She has a 7.5 pack-year smoking history. She has never used smokeless tobacco. She reports that she drinks about 18.0 oz of alcohol per week. She reports that she uses illicit drugs ("Crack" cocaine).  ALLERGIES: Allergies  Allergen Reactions  . Gabapentin Itching and Swelling  . Saphris [Asenapine] Swelling    Swelling of the face  . Tramadol Swelling  Causes legs to swell  . Penicillins Hives    Has tolerated Cefepime and Ceftriaxone.    ROS: A comprehensive review of systems was negative except for: Respiratory: positive for dyspnea on exertion Cardiovascular: positive for chest pain Musculoskeletal: positive for arm pain  HOME MEDICATIONS: Prescriptions prior to admission  Medication Sig Dispense Refill Last Dose  . carvedilol (COREG) 25 MG tablet Take 1  tablet (25 mg total) by mouth 2 (two) times daily. 60 tablet 0 08/24/2014 at 2100  . cloNIDine (CATAPRES) 0.1 MG tablet Take 1 tablet (0.1 mg total) by mouth 3 (three) times daily as needed (for hypertension). 60 tablet 11 08/23/2014 at Unknown time  . ferrous sulfate 325 (65 FE) MG tablet Take 1 tablet (325 mg total) by mouth daily with breakfast. 30 tablet 0 08/23/2014 at Unknown time  . furosemide (LASIX) 40 MG tablet Take 2 tablets (80 mg total) by mouth 2 (two) times daily. 60 tablet 0 08/24/2014 at Unknown time  . insulin aspart (NOVOLOG) 100 UNIT/ML injection Inject 2-10 Units into the skin 3 (three) times daily before meals. SSI: 0-200 = 2 units, 201-250 = 4 units, 251-300 = 6 units, 301-350 = 8 units, 351-400 = 10 units   08/24/2014 at Unknown time  . insulin glargine (LANTUS) 100 UNIT/ML injection Inject 0.2 mLs (20 Units total) into the skin at bedtime. 10 mL 0 08/23/2014 at Unknown time  . levETIRAcetam (KEPPRA) 500 MG tablet Take 1 tablet (500 mg total) by mouth 2 (two) times daily. (Patient taking differently: Take 1,000 mg by mouth at bedtime. ) 60 tablet 0 08/23/2014 at Unknown time  . Multiple Vitamins-Minerals (SENIOR MULTIVITAMIN PLUS PO) Take 1 tablet by mouth every morning.   08/24/2014 at Unknown time  . polyethylene glycol (MIRALAX / GLYCOLAX) packet Take 17 g by mouth 2 (two) times daily.   08/24/2014 at Unknown time  . simethicone (MYLICON) 80 MG chewable tablet Chew 80 mg by mouth every 8 (eight) hours as needed for flatulence.   unk  . bisacodyl (DULCOLAX) 5 MG EC tablet Take 2 tablets (10 mg total) by mouth daily. (Patient not taking: Reported on 08/24/2014) 30 tablet 0 08/08/2014 at Unknown time  . DULoxetine (CYMBALTA) 30 MG capsule Take 1 capsule (30 mg total) by mouth daily. (Patient not taking: Reported on 08/24/2014) 30 capsule 0 08/09/2014 at Unknown time  . folic acid (FOLVITE) 409 MCG tablet Take 400 mcg by mouth every morning.   08/09/2014 at Unknown time  . furosemide (LASIX) 80 MG  tablet Take 1 tablet (80 mg total) by mouth 2 (two) times daily. (Patient not taking: Reported on 08/24/2014) 60 tablet 0 08/09/2014 at Unknown time  . oxyCODONE-acetaminophen (PERCOCET) 10-325 MG per tablet Take 1 tablet by mouth every 6 (six) hours as needed for pain. (Patient not taking: Reported on 08/09/2014) 12 tablet 0   . oxyCODONE-acetaminophen (PERCOCET/ROXICET) 5-325 MG per tablet Take 1 tablet by mouth every 6 (six) hours as needed for moderate pain. (Patient not taking: Reported on 08/09/2014) 30 tablet 0 07/21/2014 at Unknown time  . silver sulfADIAZINE (SILVADENE) 1 % cream Apply topically daily. (Patient not taking: Reported on 08/09/2014) 50 g 0 07/21/2014 at Unknown time  . sulfamethoxazole-trimethoprim (BACTRIM DS,SEPTRA DS) 800-160 MG per tablet Take 1 tablet by mouth 2 (two) times daily. (Patient not taking: Reported on 08/09/2014) 10 tablet 0     HOSPITAL MEDICATIONS: Scheduled: . aspirin EC  325 mg Oral Daily  . carvedilol  25 mg Oral BID  WC  . cloNIDine  0.2 mg Oral TID  . doxycycline  100 mg Oral Q12H  . ferrous sulfate  325 mg Oral Q breakfast  . folic acid  761 mcg Oral q morning - 10a  . furosemide  80 mg Intravenous BID  . heparin  5,000 Units Subcutaneous 3 times per day  . insulin aspart  0-9 Units Subcutaneous TID WC  . insulin glargine  20 Units Subcutaneous QHS  . levETIRAcetam  1,000 mg Oral QHS  . polyethylene glycol  17 g Oral BID    VITALS: Blood pressure 144/87, pulse 82, temperature 97.9 F (36.6 C), temperature source Oral, resp. rate 17, height $RemoveBe'5\' 4"'YYTaXqHuh$  (1.626 m), weight 188 lb 4.8 oz (85.412 kg), SpO2 100 %.  PHYSICAL EXAM: General appearance: alert and no distress Neck: no carotid bruit and no JVD Lungs: clear to auscultation bilaterally Heart: regular rate and rhythm, S1, S2 normal, no murmur, click, rub or gallop Abdomen: soft, non-tender; bowel sounds normal; no masses,  no organomegaly Extremities: extremities normal, atraumatic, no cyanosis or  edema Pulses: 2+ and symmetric Skin: Skin color, texture, turgor normal. No rashes or lesions Neurologic: Alert and oriented X 3, normal strength and tone. Normal symmetric reflexes. Normal coordination and gait Psych: Poor attention, normal mood, pleasant  LABS: Results for orders placed or performed during the hospital encounter of 08/24/14 (from the past 48 hour(s))  CBC with Differential     Status: Abnormal   Collection Time: 08/24/14 11:19 PM  Result Value Ref Range   WBC 3.1 (L) 4.0 - 10.5 K/uL   RBC 3.16 (L) 3.87 - 5.11 MIL/uL   Hemoglobin 9.3 (L) 12.0 - 15.0 g/dL   HCT 26.9 (L) 36.0 - 46.0 %   MCV 85.1 78.0 - 100.0 fL   MCH 29.4 26.0 - 34.0 pg   MCHC 34.6 30.0 - 36.0 g/dL   RDW 14.4 11.5 - 15.5 %   Platelets 157 150 - 400 K/uL   Neutrophils Relative % 35 (L) 43 - 77 %   Neutro Abs 1.1 (L) 1.7 - 7.7 K/uL   Lymphocytes Relative 55 (H) 12 - 46 %   Lymphs Abs 1.7 0.7 - 4.0 K/uL   Monocytes Relative 8 3 - 12 %   Monocytes Absolute 0.2 0.1 - 1.0 K/uL   Eosinophils Relative 2 0 - 5 %   Eosinophils Absolute 0.1 0.0 - 0.7 K/uL   Basophils Relative 0 0 - 1 %   Basophils Absolute 0.0 0.0 - 0.1 K/uL  Troponin I     Status: None   Collection Time: 08/24/14 11:19 PM  Result Value Ref Range   Troponin I <0.03 <0.031 ng/mL    Comment:        NO INDICATION OF MYOCARDIAL INJURY. Please note change in reference range.   Basic metabolic panel     Status: Abnormal   Collection Time: 08/24/14 11:19 PM  Result Value Ref Range   Sodium 137 135 - 145 mmol/L    Comment: Please note change in reference range.   Potassium 2.9 (L) 3.5 - 5.1 mmol/L    Comment: Please note change in reference range.   Chloride 98 96 - 112 mEq/L   CO2 30 19 - 32 mmol/L   Glucose, Bld 274 (H) 70 - 99 mg/dL   BUN 38 (H) 6 - 23 mg/dL   Creatinine, Ser 4.09 (H) 0.50 - 1.10 mg/dL   Calcium 7.7 (L) 8.4 - 10.5 mg/dL   GFR calc  non Af Amer 12 (L) >90 mL/min   GFR calc Af Amer 14 (L) >90 mL/min    Comment:  (NOTE) The eGFR has been calculated using the CKD EPI equation. This calculation has not been validated in all clinical situations. eGFR's persistently <90 mL/min signify possible Chronic Kidney Disease.    Anion gap 9 5 - 15  MRSA PCR Screening     Status: None   Collection Time: 08/25/14  2:26 AM  Result Value Ref Range   MRSA by PCR NEGATIVE NEGATIVE    Comment:        The GeneXpert MRSA Assay (FDA approved for NASAL specimens only), is one component of a comprehensive MRSA colonization surveillance program. It is not intended to diagnose MRSA infection nor to guide or monitor treatment for MRSA infections.   Glucose, capillary     Status: Abnormal   Collection Time: 08/25/14  2:33 AM  Result Value Ref Range   Glucose-Capillary 362 (H) 70 - 99 mg/dL  Troponin I-serum (0, 3, 6 hours)     Status: None   Collection Time: 08/25/14  4:03 AM  Result Value Ref Range   Troponin I 0.03 <0.031 ng/mL    Comment:        NO INDICATION OF MYOCARDIAL INJURY. Please note change in reference range.   Troponin I-serum (0, 3, 6 hours)     Status: None   Collection Time: 08/25/14  4:03 AM  Result Value Ref Range   Troponin I 0.03 <0.031 ng/mL    Comment:        NO INDICATION OF MYOCARDIAL INJURY. Please note change in reference range.   Hemoglobin A1c     Status: Abnormal   Collection Time: 08/25/14  4:03 AM  Result Value Ref Range   Hgb A1c MFr Bld 9.8 (H) <5.7 %    Comment: (NOTE)                                                                       According to the ADA Clinical Practice Recommendations for 2011, when HbA1c is used as a screening test:  >=6.5%   Diagnostic of Diabetes Mellitus           (if abnormal result is confirmed) 5.7-6.4%   Increased risk of developing Diabetes Mellitus References:Diagnosis and Classification of Diabetes Mellitus,Diabetes SNKN,3976,73(ALPFX 1):S62-S69 and Standards of Medical Care in         Diabetes - 2011,Diabetes TKWI,0973,53  (Suppl 1):S11-S61.    Mean Plasma Glucose 235 (H) <117 mg/dL    Comment: Performed at [facility]  Glucose, capillary     Status: Abnormal   Collection Time: 08/25/14  7:28 AM  Result Value Ref Range   Glucose-Capillary 427 (H) 70 - 99 mg/dL  Basic metabolic panel     Status: Abnormal   Collection Time: 08/25/14  8:25 AM  Result Value Ref Range   Sodium 134 (L) 135 - 145 mmol/L    Comment: Please note change in reference range.   Potassium 3.4 (L) 3.5 - 5.1 mmol/L    Comment: Please note change in reference range.   Chloride 98 96 - 112 mEq/L   CO2 28 19 - 32 mmol/L   Glucose, Bld 475 (  H) 70 - 99 mg/dL   BUN 37 (H) 6 - 23 mg/dL   Creatinine, Ser 3.94 (H) 0.50 - 1.10 mg/dL   Calcium 7.4 (L) 8.4 - 10.5 mg/dL   GFR calc non Af Amer 12 (L) >90 mL/min   GFR calc Af Amer 14 (L) >90 mL/min    Comment: (NOTE) The eGFR has been calculated using the CKD EPI equation. This calculation has not been validated in all clinical situations. eGFR's persistently <90 mL/min signify possible Chronic Kidney Disease.    Anion gap 8 5 - 15  Troponin I     Status: None   Collection Time: 08/25/14  8:25 AM  Result Value Ref Range   Troponin I <0.03 <0.031 ng/mL    Comment:        NO INDICATION OF MYOCARDIAL INJURY. Please note change in reference range.   Magnesium     Status: Abnormal   Collection Time: 08/25/14  8:25 AM  Result Value Ref Range   Magnesium 1.4 (L) 1.5 - 2.5 mg/dL  Glucose, capillary     Status: Abnormal   Collection Time: 08/25/14 11:24 AM  Result Value Ref Range   Glucose-Capillary 289 (H) 70 - 99 mg/dL    IMAGING: Dg Chest Port 1 View  08/24/2014   CLINICAL DATA:  Left-sided chest pain. Shortness of breath. History of CHF.  EXAM: PORTABLE CHEST - 1 VIEW  COMPARISON:  08/09/2014.  FINDINGS: Mild enlargement of the cardiac silhouette. Normal mediastinal and hilar contours.  Clear lungs.  No pleural effusion or pneumothorax.  Bony thorax is unremarkable.   IMPRESSION: No active disease.   Electronically Signed   By: Lajean Manes M.D.   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 Accelerated hypertension - work to wean off of nitroglycerin infusion and restart home medications. Goal blood pressure is 130/80 after 24 hours from admission. Currently she is having no neurologic symptoms or further chest pain. 2. Indeterminate risk for upcoming surgery- currently she is at indeterminate, but at least moderate risk for upcoming surgery. She has significant renal dysfunction and poorly controlled hypertension and diabetes which are major risk factors for and markers for poor outcomes. Cardiac standpoint she has not had prior stress testing and I would recommend a nuclear stress test to further assess her risk. 3. Chest pain- I suspect this is related to hypertensive emergency. Troponins have been negative 3. Stress testing will be recommended to evaluate for the etiology of her chest pain given her multiple cardiac risk factors. If she has a high risk stress test we will strongly to consider whether she would want to have cardiac catheterization given the risk of contrast nephropathy which is quite high and could lead to short-term or permanent dialysis.  Thanks for consulting [place], I will continue to follow with .  Time Spent Directly with Patient: 30 minutes  Pixie Casino, MD, [facility] Attending Cardiologist [facility]  Thecla Forgione C 08/25/2014, 3:03 PM

## 2014-08-25 NOTE — Progress Notes (Addendum)
Patient admitted after midnight.  Will get stat BMP and Mg for K level.  Has received 30 meq so far but also getting lasix.  BP has responded to nitro gtt.  Patient requesting cards consult  apparently patient has been non compliant with meds but has decided she wants surgery for her arm and therefore will need to get her chronic medical issues under-control.  Was to see Dr. Johnsie Cancel today for cardiac eval prior to surgery   Eulogio Bear DO

## 2014-08-25 NOTE — Progress Notes (Signed)
On call NP paged for notification of K of 2.9, CBG of 362, and BP 211/112. NTG gtt drip titrated to 45mcg from 22mcg. Requesting potassium replacement and order for one time dose of insulin. Awaiting response. Patient asymptomatic, will continue to monitor closely.

## 2014-08-26 ENCOUNTER — Other Ambulatory Visit: Payer: Self-pay

## 2014-08-26 ENCOUNTER — Inpatient Hospital Stay (HOSPITAL_COMMUNITY): Admission: RE | Admit: 2014-08-26 | Payer: Medicare Other | Source: Ambulatory Visit | Admitting: Orthopedic Surgery

## 2014-08-26 ENCOUNTER — Encounter (HOSPITAL_COMMUNITY): Admission: RE | Payer: Self-pay | Source: Ambulatory Visit

## 2014-08-26 ENCOUNTER — Inpatient Hospital Stay (HOSPITAL_COMMUNITY): Payer: Medicare Other

## 2014-08-26 DIAGNOSIS — R0789 Other chest pain: Secondary | ICD-10-CM

## 2014-08-26 DIAGNOSIS — I429 Cardiomyopathy, unspecified: Secondary | ICD-10-CM | POA: Insufficient documentation

## 2014-08-26 DIAGNOSIS — R079 Chest pain, unspecified: Secondary | ICD-10-CM

## 2014-08-26 DIAGNOSIS — D61818 Other pancytopenia: Secondary | ICD-10-CM

## 2014-08-26 LAB — CBC
HCT: 23.7 % — ABNORMAL LOW (ref 36.0–46.0)
Hemoglobin: 7.9 g/dL — ABNORMAL LOW (ref 12.0–15.0)
MCH: 29.3 pg (ref 26.0–34.0)
MCHC: 33.3 g/dL (ref 30.0–36.0)
MCV: 87.8 fL (ref 78.0–100.0)
Platelets: 138 10*3/uL — ABNORMAL LOW (ref 150–400)
RBC: 2.7 MIL/uL — ABNORMAL LOW (ref 3.87–5.11)
RDW: 14.5 % (ref 11.5–15.5)
WBC: 2.4 10*3/uL — ABNORMAL LOW (ref 4.0–10.5)

## 2014-08-26 LAB — BASIC METABOLIC PANEL
Anion gap: 9 (ref 5–15)
BUN: 43 mg/dL — AB (ref 6–23)
CHLORIDE: 102 meq/L (ref 96–112)
CO2: 27 mmol/L (ref 19–32)
Calcium: 7.7 mg/dL — ABNORMAL LOW (ref 8.4–10.5)
Creatinine, Ser: 4.19 mg/dL — ABNORMAL HIGH (ref 0.50–1.10)
GFR calc non Af Amer: 12 mL/min — ABNORMAL LOW (ref >90)
GFR, EST AFRICAN AMERICAN: 13 mL/min — AB (ref >90)
Glucose, Bld: 256 mg/dL — ABNORMAL HIGH (ref 70–99)
POTASSIUM: 3.4 mmol/L — AB (ref 3.5–5.1)
SODIUM: 138 mmol/L (ref 135–145)

## 2014-08-26 LAB — GLUCOSE, CAPILLARY
GLUCOSE-CAPILLARY: 240 mg/dL — AB (ref 70–99)
Glucose-Capillary: 202 mg/dL — ABNORMAL HIGH (ref 70–99)
Glucose-Capillary: 241 mg/dL — ABNORMAL HIGH (ref 70–99)

## 2014-08-26 IMAGING — CR DG FOOT COMPLETE 3+V*L*
3 series · 3 of 3 positions shown · non-contrast
Comparison: None.

CLINICAL DATA: Left foot pain and swelling. Plantar ulcer.
Diabetes.

EXAM:
LEFT FOOT - COMPLETE 3+ VIEW

[view not recorded (1 of 3)]
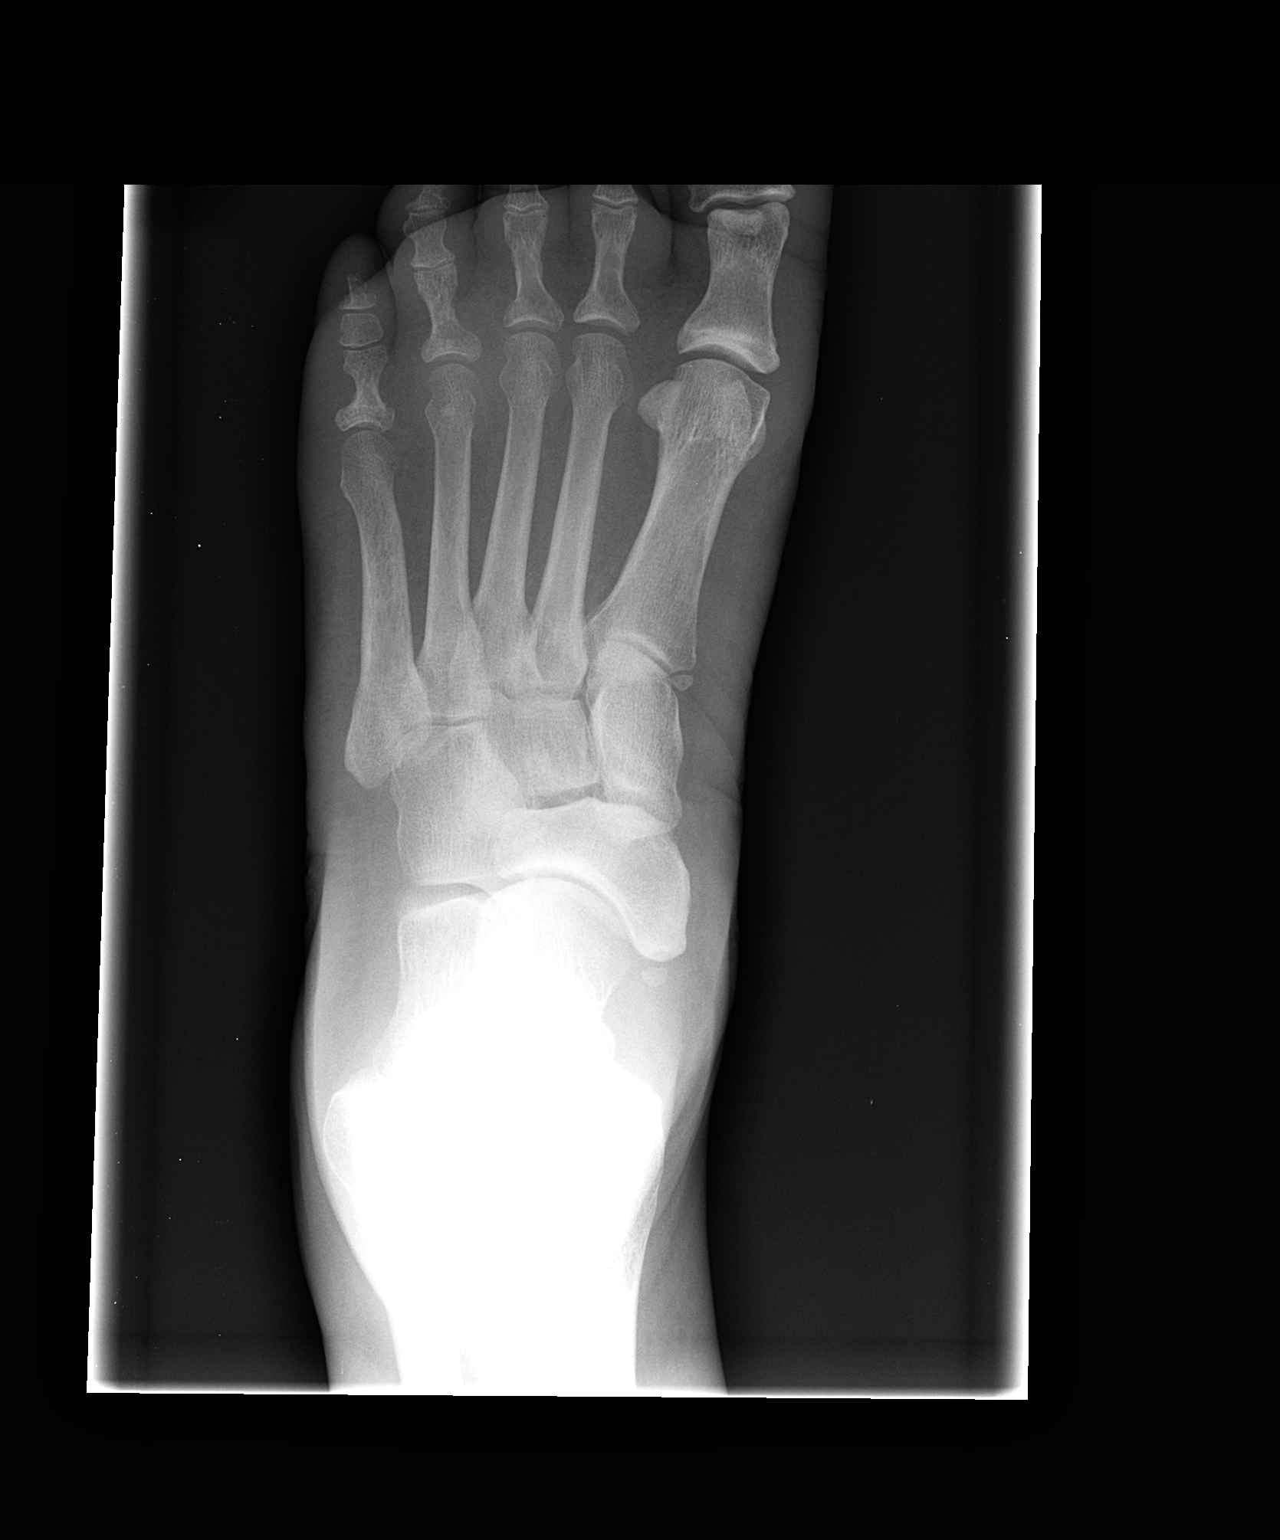

[view not recorded (2 of 3)]
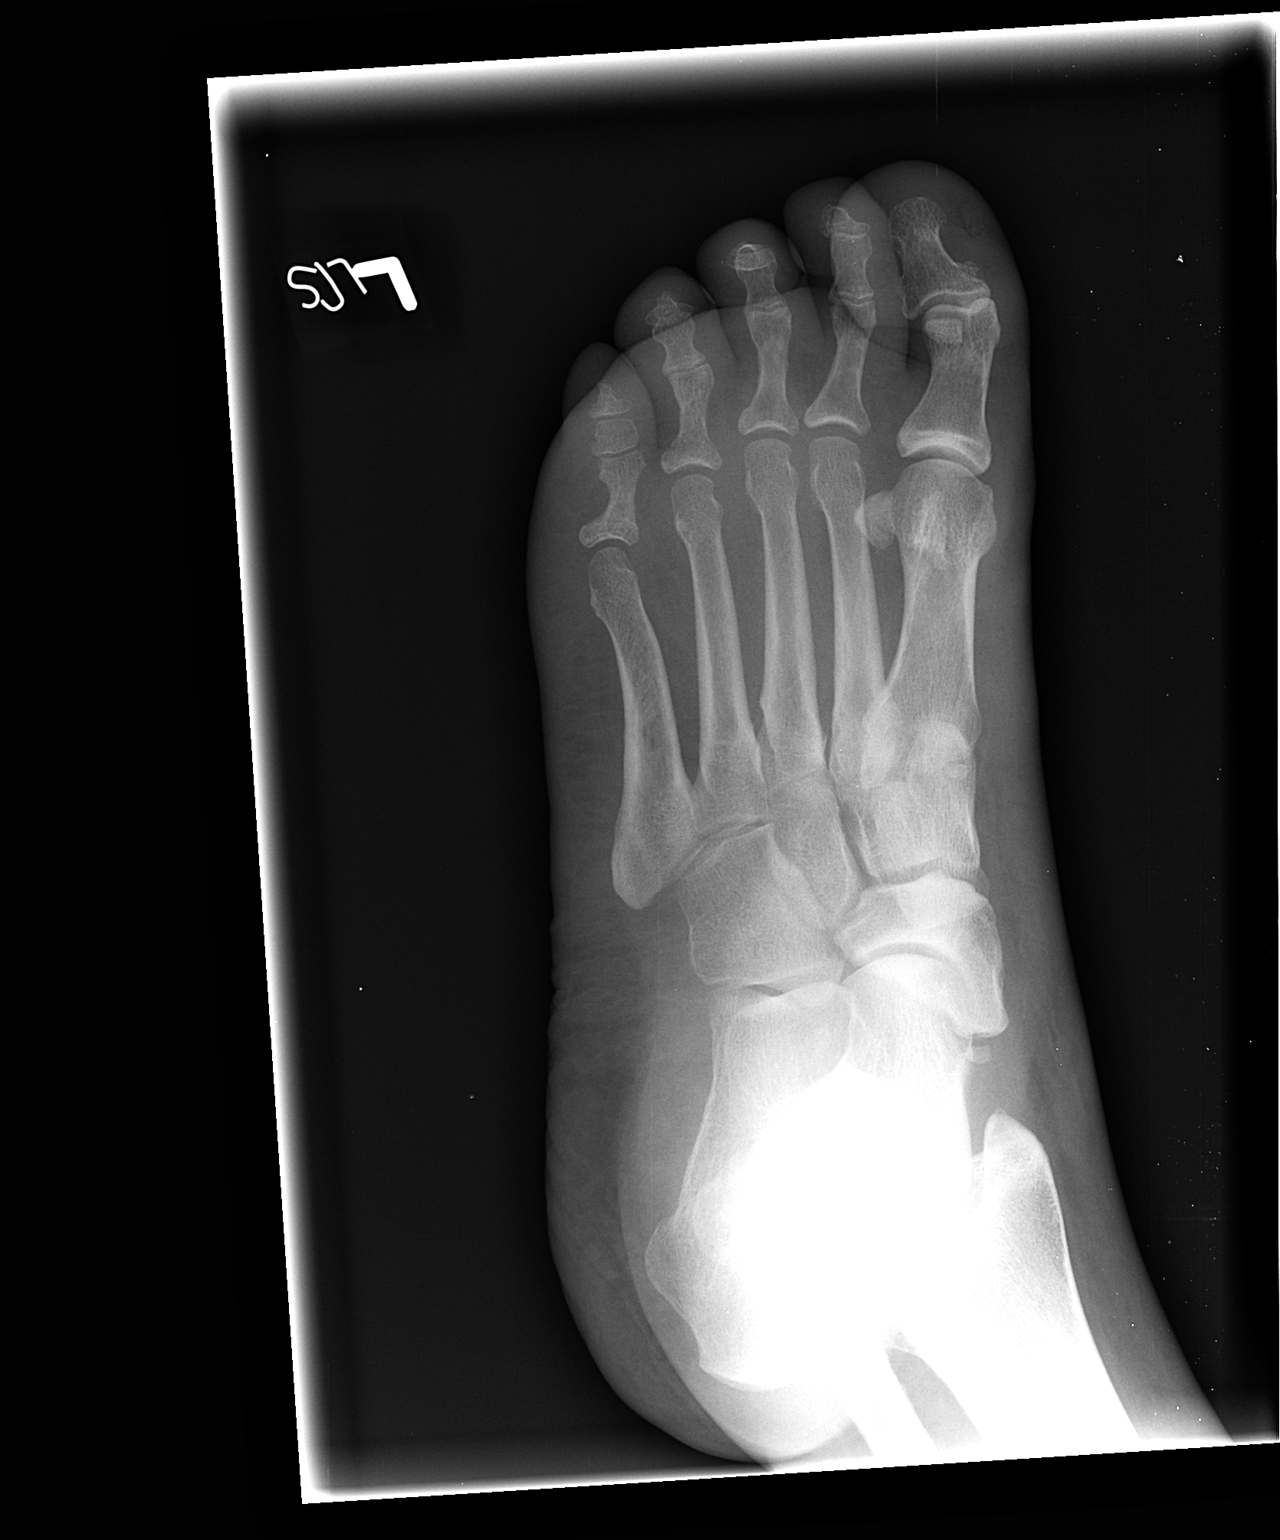

[view not recorded (3 of 3)]
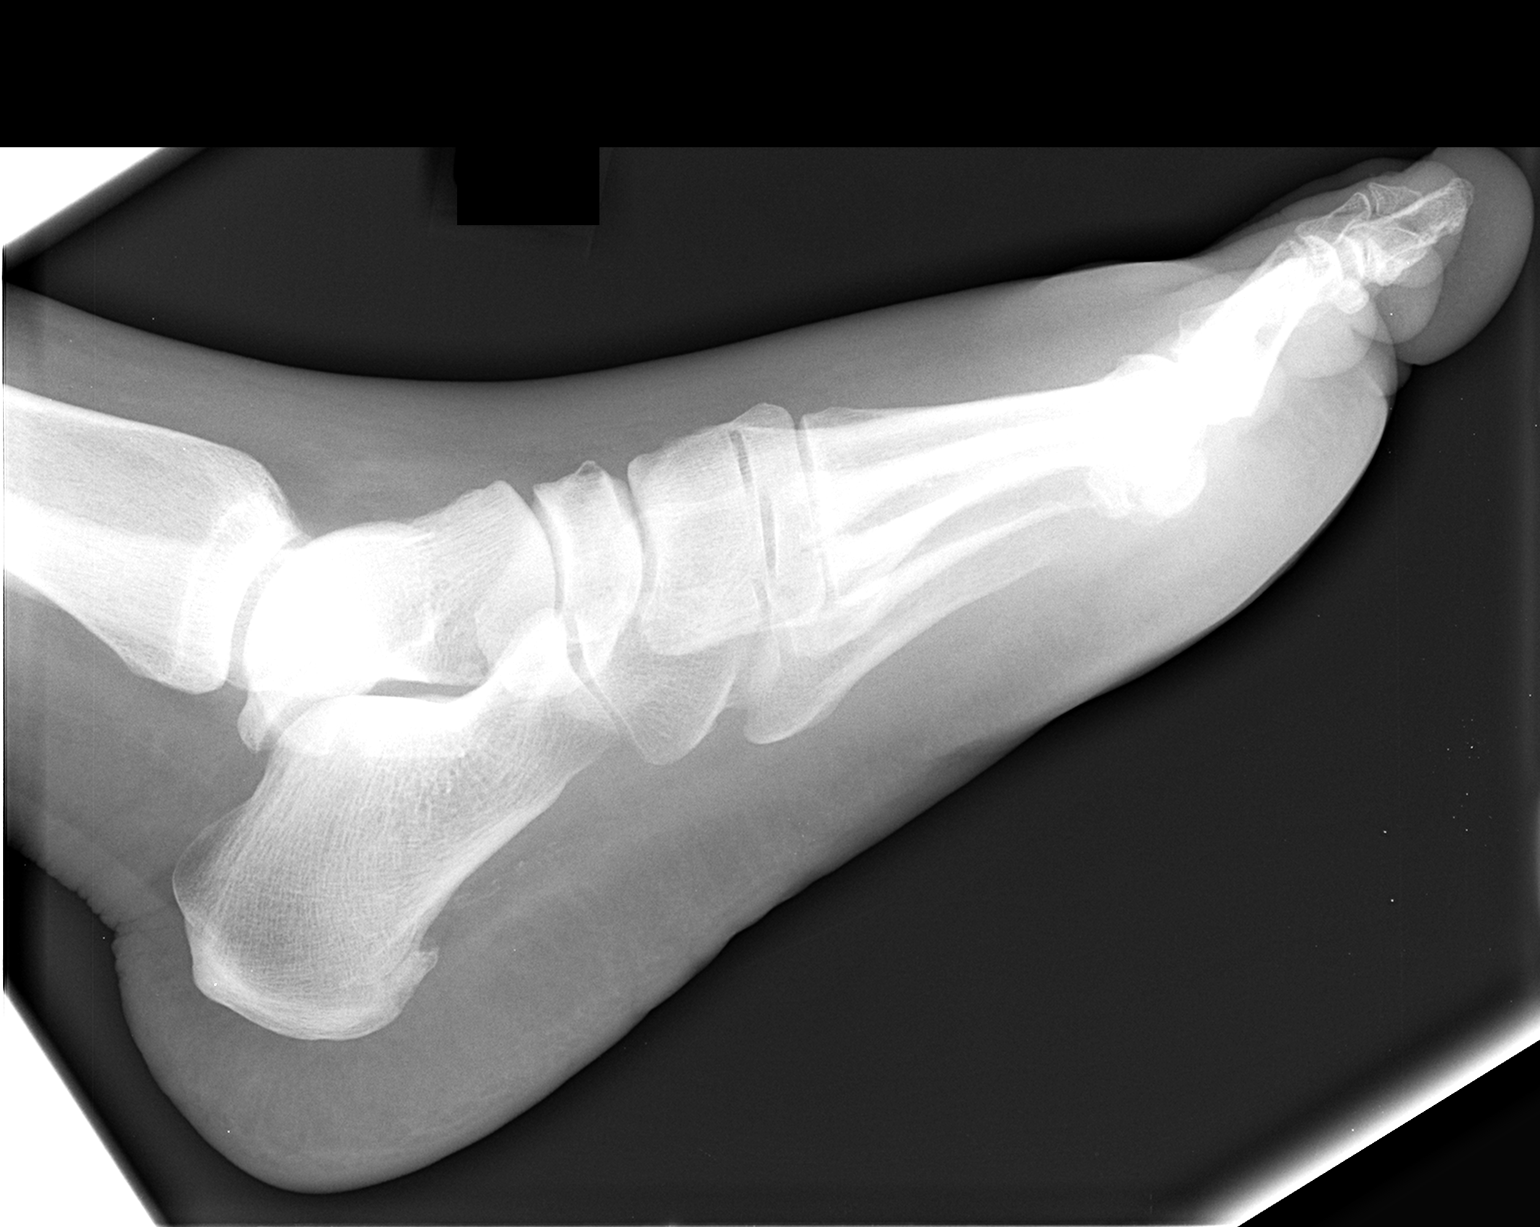

[3 of 3 positions shown; findings below may reference images not displayed]

FINDINGS: There is no evidence of fracture or dislocation. There is no
evidence of arthropathy or other focal bone abnormality. No evidence
of periostitis or osteolysis. No evidence of soft tissue gas. Small
plantar calcaneal bone spur noted as well as mild peripheral
vascular calcification.
IMPRESSION: No acute findings.

Small plantar calcaneal bone spur and mild peripheral vascular
calcification.

## 2014-08-26 IMAGING — CR DG CHEST 2V
2 series · 2 of 2 positions shown · non-contrast
Comparison: February 09, 2013

CLINICAL DATA: hypertension and dyspnea on exertion

EXAM:
CHEST  2 VIEW

[w chest pa]
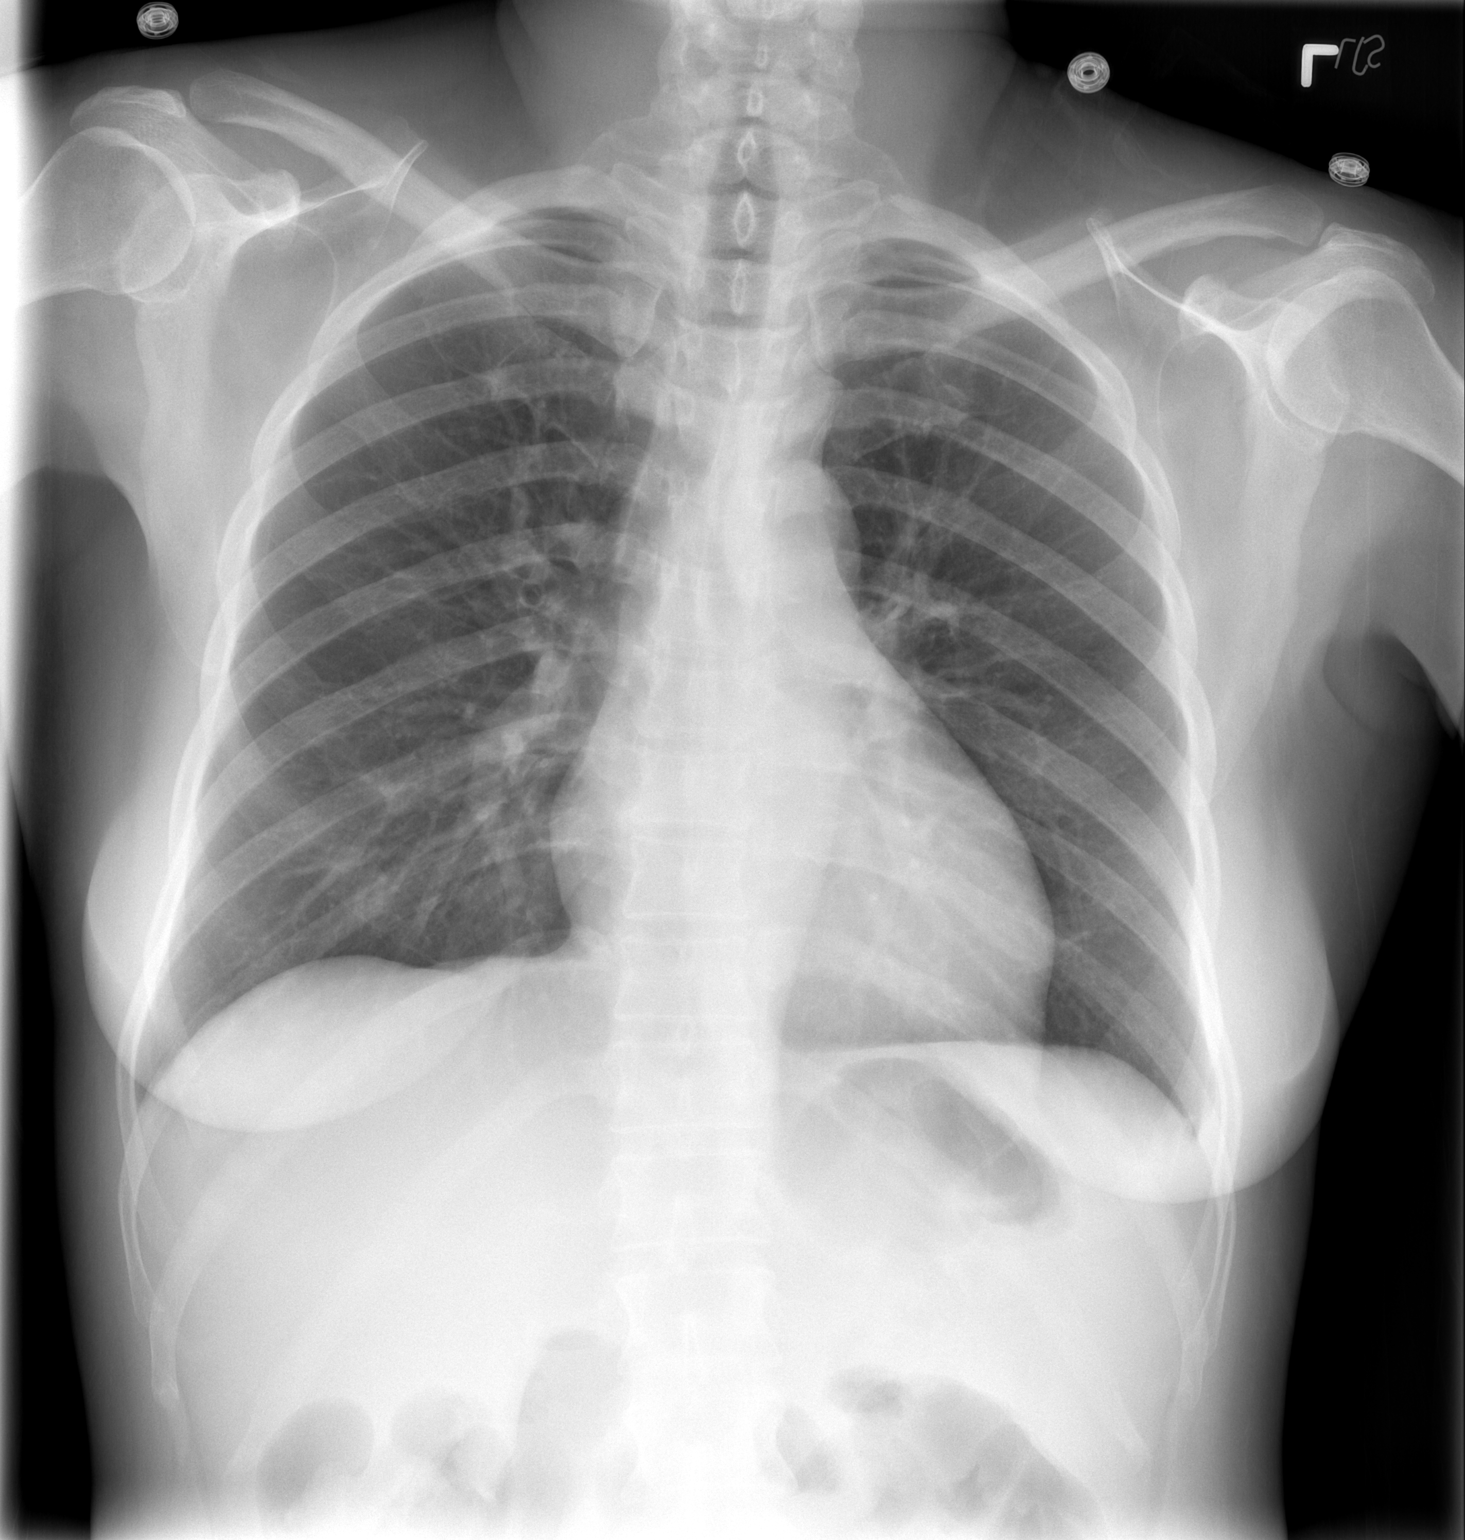

[w chest lat]
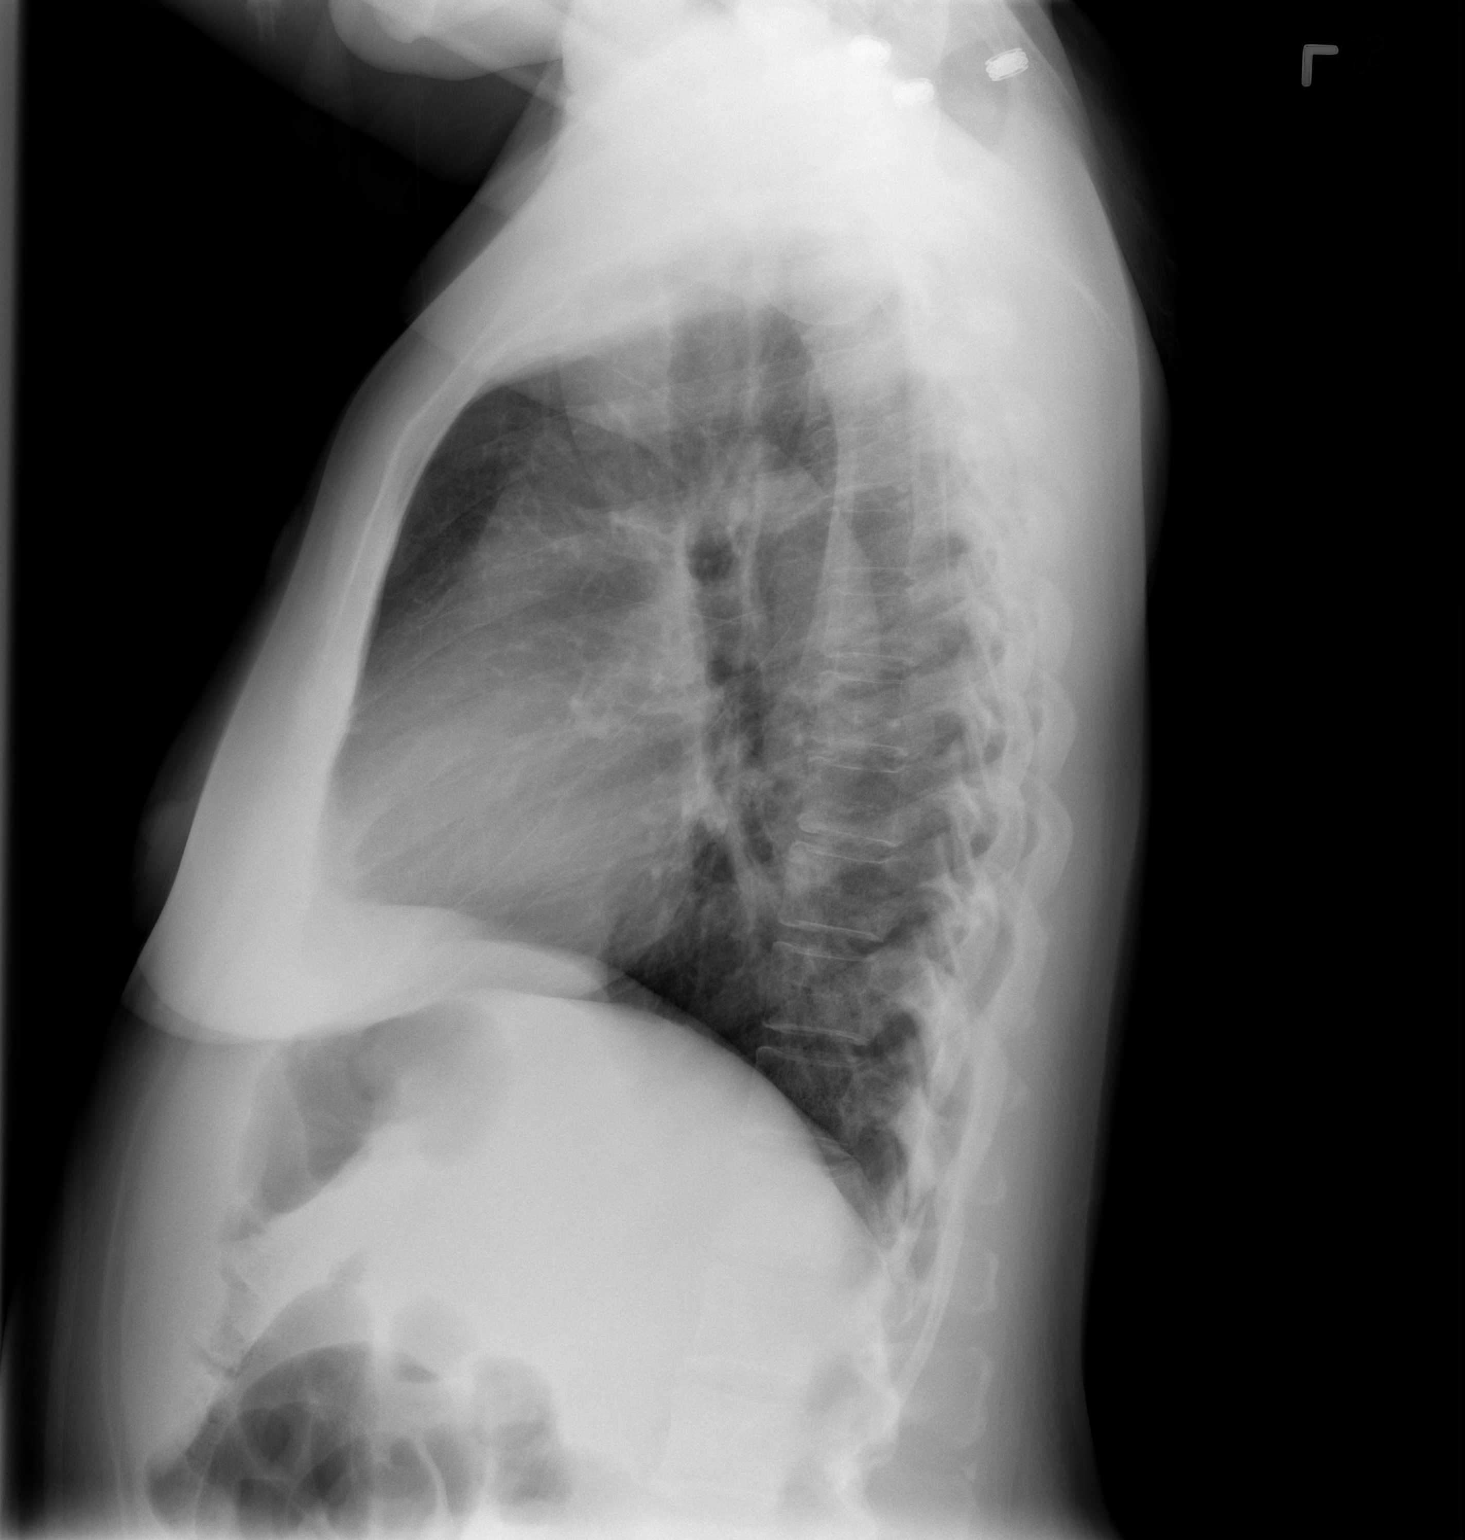

[2 of 2 positions shown; findings below may reference images not displayed]

FINDINGS: The lungs are clear. Heart size and pulmonary vascularity are
normal. No adenopathy. No bone lesions. There are small cervical
ribs bilaterally.
IMPRESSION: No edema or consolidation. Small cervical ribs bilaterally.

## 2014-08-26 SURGERY — ULNAR NERVE DECOMPRESSION/TRANSPOSITION
Anesthesia: General

## 2014-08-26 MED ORDER — POTASSIUM CHLORIDE CRYS ER 20 MEQ PO TBCR
20.0000 meq | EXTENDED_RELEASE_TABLET | Freq: Once | ORAL | Status: AC
  Administered 2014-08-26: 20 meq via ORAL
  Filled 2014-08-26: qty 1

## 2014-08-26 MED ORDER — NITROGLYCERIN IN D5W 200-5 MCG/ML-% IV SOLN: 10.0000 ug/min | INTRAVENOUS | Status: DC

## 2014-08-26 MED ORDER — TECHNETIUM TC 99M SESTAMIBI GENERIC - CARDIOLITE
10.0000 | Freq: Once | INTRAVENOUS | Status: AC | PRN
  Administered 2014-08-26: 10 via INTRAVENOUS

## 2014-08-26 MED ORDER — TECHNETIUM TC 99M SESTAMIBI GENERIC - CARDIOLITE
30.0000 | Freq: Once | INTRAVENOUS | Status: AC | PRN
  Administered 2014-08-26: 30 via INTRAVENOUS

## 2014-08-26 MED ORDER — CEPHALEXIN 500 MG PO CAPS
500.0000 mg | ORAL_CAPSULE | Freq: Two times a day (BID) | ORAL | Status: DC
  Administered 2014-08-26 – 2014-08-27 (×3): 500 mg via ORAL
  Filled 2014-08-26 (×3): qty 1

## 2014-08-26 MED ORDER — REGADENOSON 0.4 MG/5ML IV SOLN
0.4000 mg | Freq: Once | INTRAVENOUS | Status: AC
  Administered 2014-08-26: 0.4 mg via INTRAVENOUS

## 2014-08-26 MED ORDER — INSULIN GLARGINE 100 UNIT/ML ~~LOC~~ SOLN
25.0000 [IU] | Freq: Every day | SUBCUTANEOUS | Status: DC
  Administered 2014-08-26 – 2014-08-27 (×2): 25 [IU] via SUBCUTANEOUS
  Filled 2014-08-26 (×2): qty 0.25

## 2014-08-26 MED ORDER — REGADENOSON 0.4 MG/5ML IV SOLN
INTRAVENOUS | Status: AC
  Filled 2014-08-26: qty 5

## 2014-08-26 MED ORDER — FUROSEMIDE 80 MG PO TABS
80.0000 mg | ORAL_TABLET | Freq: Two times a day (BID) | ORAL | Status: DC
  Administered 2014-08-26 – 2014-08-27 (×3): 80 mg via ORAL
  Filled 2014-08-26 (×3): qty 1

## 2014-08-26 NOTE — Progress Notes (Signed)
  Echocardiogram 2D Echocardiogram has been performed.  [name] 08/26/2014, 8:46 AM

## 2014-08-26 NOTE — Progress Notes (Addendum)
PROGRESS NOTE  [name] [id number] DOB: [birth year] DOA: 08/24/2014 PCP: Maurice Small, D, MD  Assessment/Plan: Chest Pain -resolved -wean off nitro - CE negative -stress test  Type 2 Diabetes Mellitus -SSI -increase lantus  CKD Stage 4 -monitor labs -monitor fluids IOs  Hypokalemia -replace as needed   Accelerated Hypertension -resume home meds -wean off nitro  Puncture wound Right foot -placed on doxy- will change to kelfex- MRSA negative Wound care  Anemia of CD- technically pancytopenic- recheck CBC In AM-- on doxy- will change to another agent -transfuse for < 7 -may need heme-onc consult  Patient has been non-compliant with follow up for the last few months.  She has decided that she wants to have her arm surgery done so she is now agreeable to take meds and have procedures  Code Status: full Family Communication: patient Disposition Plan:    Consultants:  cards  Procedures:      HPI/Subjective: No chest pain Feeling better  Objective: Filed Vitals:   08/26/14 0805  BP: 105/71  Pulse: 78  Temp: 97.7 F (36.5 C)  Resp:     Intake/Output Summary (Last 24 hours) at 08/26/14 0931 Last data filed at 08/26/14 0500  Gross per 24 hour  Intake    618 ml  Output   3500 ml  Net  -2882 ml   Filed Weights   08/24/14 2252 08/25/14 0229 08/26/14 0400  Weight: 86.637 kg (191 lb) 85.412 kg (188 lb 4.8 oz) 84.369 kg (186 lb)    Exam:   General:  A+Ox3, NAD  Cardiovascular: rrr  Respiratory: no wheezing  Abdomen: +BS, soft  Musculoskeletal: left foot mid tarsal amputation   Data Reviewed: Basic Metabolic Panel:  Recent Labs Lab 08/24/14 2319 08/25/14 0825 08/26/14 0312  NA 137 134* 138  K 2.9* 3.4* 3.4*  CL 98 98 102  CO2 30 28 27   GLUCOSE 274* 475* 256*  BUN 38* 37* 43*  CREATININE 4.09* 3.94* 4.19*  CALCIUM 7.7* 7.4* 7.7*  MG  --  1.4*  --    Liver Function Tests: No results for input(s): AST, ALT, ALKPHOS,  BILITOT, PROT, ALBUMIN in the last 168 hours. No results for input(s): LIPASE, AMYLASE in the last 168 hours. No results for input(s): AMMONIA in the last 168 hours. CBC:  Recent Labs Lab 08/24/14 2319 08/26/14 0312  WBC 3.1* 2.4*  NEUTROABS 1.1*  --   HGB 9.3* 7.9*  HCT 26.9* 23.7*  MCV 85.1 87.8  PLT 157 138*   Cardiac Enzymes:  Recent Labs Lab 08/24/14 2319 08/25/14 0403 08/25/14 0825  TROPONINI <0.03 0.03  0.03 <0.03   BNP (last 3 results)  Recent Labs  02/23/14 0224 04/12/14 0520 06/22/14 1532  PROBNP 2305.0* 12801.0* 28725.0*   CBG:  Recent Labs Lab 08/25/14 0728 08/25/14 1124 08/25/14 1615 08/25/14 2121 08/26/14 0802  GLUCAP 427* 289* 345* 312* 202*    Recent Results (from the past 240 hour(s))  MRSA PCR Screening     Status: None   Collection Time: 08/25/14  2:26 AM  Result Value Ref Range Status   MRSA by PCR NEGATIVE NEGATIVE Final    Comment:        The GeneXpert MRSA Assay (FDA approved for NASAL specimens only), is one component of a comprehensive MRSA colonization surveillance program. It is not intended to diagnose MRSA infection nor to guide or monitor treatment for MRSA infections.      Studies: Dg Chest Port 1 View  08/24/2014  CLINICAL DATA:  Left-sided chest pain. Shortness of breath. History of CHF.  EXAM: PORTABLE CHEST - 1 VIEW  COMPARISON:  08/09/2014.  FINDINGS: Mild enlargement of the cardiac silhouette. Normal mediastinal and hilar contours.  Clear lungs.  No pleural effusion or pneumothorax.  Bony thorax is unremarkable.  IMPRESSION: No active disease.   Electronically Signed   By: Lajean Manes M.D.   On: 08/24/2014 23:20    Scheduled Meds: . aspirin EC  325 mg Oral Daily  . carvedilol  25 mg Oral BID WC  . cloNIDine  0.2 mg Oral TID  . doxycycline  100 mg Oral Q12H  . ferrous sulfate  325 mg Oral Q breakfast  . folic acid  [id number] mcg Oral q morning - 10a  . furosemide  80 mg Oral BID  . heparin  5,000 Units  Subcutaneous 3 times per day  . insulin aspart  0-9 Units Subcutaneous TID WC  . insulin glargine  20 Units Subcutaneous Daily  . levETIRAcetam  1,000 mg Oral QHS  . polyethylene glycol  17 g Oral BID  . potassium chloride  20 mEq Oral Once   Continuous Infusions: . 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 If 7PM-7AM, please contact night-coverage at [email or web address], password [facility] 08/26/2014, 9:31 AM  LOS: 2 days

## 2014-08-26 NOTE — Progress Notes (Signed)
Lexiscan nuc completed without complications. Await images. [facility] Radiology to read. Kassandra Meriweather PA-C

## 2014-08-26 NOTE — Care Management Note (Signed)
    Page 1 of 1   08/26/2014     2:10:54 PM CARE MANAGEMENT NOTE 08/26/2014  Patient:  [name], [name]   Account Number:  [id number]  Date Initiated:  08/26/2014  Documentation initiated by:  GRAVES-BIGELOW,Yaseen Gilberg  Subjective/Objective Assessment:   Pt admitted for chest pain. Pt is active with [facility].     Action/Plan:   NO needs per CM at this time.   Anticipated DC Date:  08/28/2014   Anticipated DC Plan:  HOME/SELF CARE         Choice offered to / List presented to:             Status of service:  Completed, signed off Medicare Important Message given?  YES (If response is "NO", the following Medicare IM given date fields will be blank) Date Medicare IM given:  08/26/2014 Medicare IM given by:  GRAVES-BIGELOW,[place] Date Additional Medicare IM given:   Additional Medicare IM given by:    Discharge Disposition:  HOME/SELF CARE  Per UR Regulation:  Reviewed for med. necessity/level of care/duration of stay  If discussed at [place] of Stay Meetings, dates discussed:    Comments:

## 2014-08-26 NOTE — Progress Notes (Signed)
Subjective: Feeling much better.  No CP.  Appears to have orthopnea.  +LEE  Objective: Vital signs in last 24 hours: Temp:  [97.7 F (36.5 C)-98.5 F (36.9 C)] 97.7 F (36.5 C) (01/22 0805) Pulse Rate:  [75-81] 78 (01/22 0805) Resp:  [12-16] 13 (01/22 0400) BP: (105-197)/(67-99) 105/71 mmHg (01/22 0805) SpO2:  [99 %-100 %] 99 % (01/22 0805) Weight:  [186 lb (84.369 kg)] 186 lb (84.369 kg) (01/22 0400) Last BM Date: 08/25/14  Intake/Output from previous day: 01/21 0701 - 01/22 0700 In: 858 [P.O.:840; I.V.:18] Out: 3500 [Urine:3500] Intake/Output this shift:    Medications Current Facility-Administered Medications  Medication Dose Route Frequency Provider Last Rate Last Dose  . acetaminophen (TYLENOL) tablet 650 mg  650 mg Oral Q4H PRN Allyne Gee, MD   650 mg at 08/26/14 0545  . aspirin EC tablet 325 mg  325 mg Oral Daily Allyne Gee, MD   325 mg at 08/25/14 1116  . carvedilol (COREG) tablet 25 mg  25 mg Oral BID WC Allyne Gee, MD   25 mg at 08/25/14 1703  . cloNIDine (CATAPRES) tablet 0.2 mg  0.2 mg Oral TID Geradine Girt, DO   0.2 mg at 08/25/14 2255  . doxycycline (VIBRA-TABS) tablet 100 mg  100 mg Oral Q12H Geradine Girt, DO   100 mg at 08/25/14 2255  . ferrous sulfate tablet 325 mg  325 mg Oral Q breakfast Allyne Gee, MD   325 mg at 08/25/14 0806  . folic acid (FOLVITE) tablet 0.5 mg  400 mcg Oral q morning - 10a Allyne Gee, MD   0.5 mg at 08/25/14 1116  . furosemide (LASIX) tablet 80 mg  80 mg Oral BID Geradine Girt, DO      . gi cocktail (Maalox,Lidocaine,Donnatal)  30 mL Oral QID PRN Allyne Gee, MD      . heparin injection 5,000 Units  5,000 Units Subcutaneous 3 times per day Allyne Gee, MD   5,000 Units at 08/26/14 0544  . insulin aspart (novoLOG) injection 0-9 Units  0-9 Units Subcutaneous TID WC Allyne Gee, MD   7 Units at 08/25/14 1703  . insulin glargine (LANTUS) injection 20 Units  20 Units Subcutaneous Daily Gardiner Barefoot, NP       . levETIRAcetam (KEPPRA) tablet 1,000 mg  1,000 mg Oral QHS Allyne Gee, MD   1,000 mg at 08/25/14 2255  . morphine 2 MG/ML injection 2 mg  2 mg Intravenous Q2H PRN Allyne Gee, MD   2 mg at 08/26/14 0545  . nitroGLYCERIN 50 mg in dextrose 5 % 250 mL (0.2 mg/mL) infusion  10-200 mcg/min Intravenous Titrated Geradine Girt, DO      . ondansetron (ZOFRAN) injection 4 mg  4 mg Intravenous Q6H PRN Allyne Gee, MD      . polyethylene glycol (MIRALAX / GLYCOLAX) packet 17 g  17 g Oral BID Allyne Gee, MD   17 g at 08/25/14 2300  . potassium chloride SA (K-DUR,KLOR-CON) CR tablet 20 mEq  20 mEq Oral Once Jessica U Vann, DO      . senna-docusate (Senokot-S) tablet 1 tablet  1 tablet Oral BID PRN Geradine Girt, DO   1 tablet at 08/25/14 1444  . zolpidem (AMBIEN) tablet 5 mg  5 mg Oral QHS PRN Allyne Gee, MD        PE: General appearance: alert, cooperative and no distress  Lungs: clear to auscultation bilaterally Heart: regular rate and rhythm, S1, S2 normal, no murmur, click, rub or gallop Extremities: 2+ LEE Pulses: 1+ radial pulses.  Skin: Warm and dry  Neurologic: Grossly normal  Lab Results:   Recent Labs  08/24/14 2319 08/26/14 0312  WBC 3.1* 2.4*  HGB 9.3* 7.9*  HCT 26.9* 23.7*  PLT 157 138*   BMET  Recent Labs  08/24/14 2319 08/25/14 0825 08/26/14 0312  NA 137 134* 138  K 2.9* 3.4* 3.4*  CL 98 98 102  CO2 30 28 27   GLUCOSE 274* 475* 256*  BUN 38* 37* 43*  CREATININE 4.09* 3.94* 4.19*  CALCIUM 7.7* 7.4* 7.7*   Cardiac Panel (last 3 results)  Recent Labs  08/24/14 2319 08/25/14 0403 08/25/14 0825  TROPONINI <0.03 0.03  0.03 <0.03     Assessment/Plan  49 y.o. female with a past medical history significant for bipolar 1 and schizoaffective disorders, insulin-dependent diabetes, hepatitis C, dyslipidemia, history of TIA, diabetic neuropathy, chronic anemia with refusal of blood transfusions due to her religious preference, chronic pain syndrome,  stage 3-4 chronic kidney disease, glaucoma, and prior fractures including right arm with radial nonunion fracture and status post left midfoot amputation for infection  Active Problems:   Accelerated hypertension Feels better.  BP better this morning.  Was high at 0100hrs.  Continue to monitor.   Coreg 25 bid, clonidine 0.2mg  TID, lasix 80PO BID.  Wean off NTG.      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  CBGs better.    Hypokalemia  Still a little low.  Being supplemented.    Chest pain  Resolved.  Ruled out for MI.  Nuc planned for today.  Echo:  EF 40% with diffuse hypokinesis.  Mild MR   Acute on systolic HF ON lasix PO 80mg  BID. Diuresing well.  Net fluids: -2.6L/4.0L.  Improving.  EF down from Sept 2015 when it was 55-60%.  Related to HTN or ischemia?  It is diffuse.     Anemia 9.8(08/09/14) 9.3(1/20) to 7.9 today.  Check stool guaiac.     Right supracondylar humerus fracture   LOS: 2 days    Aiven Kampe PA-C 08/26/2014 9:23 AM

## 2014-08-26 NOTE — Progress Notes (Addendum)
Nuclear stress test negative for ischemia. Does not require cardiac cath at this time. Patient made aware.  1. No reversible ischemia or infarction. 2. Nonspecific mild septal hypokinesis. 3. Left ventricular ejection fraction 48% 4. Intermediate-risk stress test findings*.  Wendell Fiebig PA-C

## 2014-08-27 LAB — GLUCOSE, CAPILLARY
GLUCOSE-CAPILLARY: 161 mg/dL — AB (ref 70–99)
Glucose-Capillary: 374 mg/dL — ABNORMAL HIGH (ref 70–99)
Glucose-Capillary: 167 mg/dL — ABNORMAL HIGH (ref 70–99)

## 2014-08-27 LAB — BASIC METABOLIC PANEL
ANION GAP: 8 (ref 5–15)
BUN: 42 mg/dL — AB (ref 6–23)
CALCIUM: 7.9 mg/dL — AB (ref 8.4–10.5)
CHLORIDE: 98 mmol/L (ref 96–112)
CO2: 27 mmol/L (ref 19–32)
Creatinine, Ser: 4.2 mg/dL — ABNORMAL HIGH (ref 0.50–1.10)
GFR, EST AFRICAN AMERICAN: 13 mL/min — AB (ref >90)
GFR, EST NON AFRICAN AMERICAN: 12 mL/min — AB (ref >90)
Glucose, Bld: 375 mg/dL — ABNORMAL HIGH (ref 70–99)
Potassium: 4 mmol/L (ref 3.5–5.1)
Sodium: 133 mmol/L — ABNORMAL LOW (ref 135–145)

## 2014-08-27 LAB — CBC
HEMATOCRIT: 26.6 % — AB (ref 36.0–46.0)
Hemoglobin: 8.8 g/dL — ABNORMAL LOW (ref 12.0–15.0)
MCH: 28.6 pg (ref 26.0–34.0)
MCHC: 33.1 g/dL (ref 30.0–36.0)
MCV: 86.4 fL (ref 78.0–100.0)
Platelets: 155 10*3/uL (ref 150–400)
RBC: 3.08 MIL/uL — ABNORMAL LOW (ref 3.87–5.11)
RDW: 14.7 % (ref 11.5–15.5)
WBC: 2.2 10*3/uL — AB (ref 4.0–10.5)

## 2014-08-27 MED ORDER — ASPIRIN 325 MG PO TBEC: 325.0000 mg | DELAYED_RELEASE_TABLET | Freq: Every day | ORAL | Status: DC

## 2014-08-27 MED ORDER — INSULIN GLARGINE 100 UNIT/ML ~~LOC~~ SOLN: 30.0000 [IU] | Freq: Every day | SUBCUTANEOUS | Status: DC

## 2014-08-27 MED ORDER — MUPIROCIN CALCIUM 2 % EX CREA
TOPICAL_CREAM | Freq: Every day | CUTANEOUS | Status: DC
Start: 2014-08-27 — End: 2015-11-01

## 2014-08-27 MED ORDER — MUPIROCIN CALCIUM 2 % EX CREA: TOPICAL_CREAM | Freq: Two times a day (BID) | CUTANEOUS | Status: DC

## 2014-08-27 MED ORDER — INSULIN ASPART 100 UNIT/ML ~~LOC~~ SOLN
5.0000 [IU] | Freq: Once | SUBCUTANEOUS | Status: AC
  Administered 2014-08-27: 5 [IU] via SUBCUTANEOUS

## 2014-08-27 MED ORDER — CLONIDINE HCL 0.2 MG PO TABS: 0.2000 mg | ORAL_TABLET | Freq: Three times a day (TID) | ORAL | Status: DC

## 2014-08-27 MED ORDER — CEPHALEXIN 500 MG PO CAPS: 500.0000 mg | ORAL_CAPSULE | Freq: Two times a day (BID) | ORAL | Status: DC

## 2014-08-27 MED ORDER — OXYCODONE-ACETAMINOPHEN 5-325 MG PO TABS: 1.0000 | ORAL_TABLET | Freq: Four times a day (QID) | ORAL | Status: DC | PRN

## 2014-08-27 MED ORDER — MUPIROCIN CALCIUM 2 % EX CREA
TOPICAL_CREAM | Freq: Every day | CUTANEOUS | Status: DC
  Filled 2014-08-27: qty 15

## 2014-08-27 NOTE — Consult Note (Signed)
WOC wound consult note Reason for Consult: Consult requested for right foot puncture wound. Wound type: Full thickness Measurement: .3X.3X.1cm Wound bed: Dry yellow callous Drainage (amount, consistency, odor) No odor or drainage Periwound: Surrounded by dry cracked peeling skin Dressing procedure/placement/frequency: Bactroban to provide antimicrobial benefits and promote moist healing.  Bandaid to protect from further injury.  Pt preparing to discharge and verbalizes understanding regarding topical treatment. Please re-consult if further assistance is needed.  Thank-you,  Julien Girt MSN, [place], [place], [place], [place]

## 2014-08-27 NOTE — Discharge Summary (Signed)
Physician Discharge Summary  [name] [id number] DOB: 07/14/1966 DOA: 08/24/2014  PCP: Maurice Small, D, MD  Admit date: 08/24/2014 Discharge date: 08/27/2014  Time spent: 35 minutes  Recommendations for Outpatient Follow-up:  1. BMP, CBC 1 week 2. Outpatient hematology consult if WBC continues to be down 3. 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 female presents with chest pain. The patient states that the pain started about 3 hours ago. She states that she had just eaten and noted the pain started under the left breast. It did not appear to radiate. She states that she took her BP medications to see if that would help. She did not take an aspirin. She states that she has not had pain like this before. She states that she has a history of CHF. No prior history of heart attacks. 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 Patient is also a diabetic. The pain has now resolved  Hospital Course:  Chest Pain -low risk stress test  Type 2 Diabetes Mellitus -SSI -increase lantus  CKD Stage 4 -outpatient renal follow up was diuresed about 7L in hospital (was not taking lasix at home)  Hypokalemia -replace as needed  Accelerated Hypertension -resume home meds -patient was not taking at home  Puncture wound, Right foot -placed on doxy- will change to kelfex- MRSA negative Wound care - follow up Dr. Sharol Given  Anemia of CD-  -defer to renal  Patient has been non-compliant with follow up for the last few months. She has  decided that she wants to have her arm surgery done so she is now agreeable to take meds and have procedures except dialysis  Procedures:  Stress test  Consultations:  cardiology  Discharge Exam: Filed Vitals:   08/27/14 1010  BP: 150/80  Pulse:   Temp:   Resp:     General: A+Ox3, NAD   Discharge Instructions   Discharge Instructions    Diet - low sodium heart healthy    Complete by:  As directed      Diet Carb Modified    Complete by:  As directed      Discharge instructions    Complete by:  As directed   Cbc, bmp 1 week Outpatient referral to hematology if continues to have low WBC count     Increase activity slowly    Complete by:  As directed           Current Discharge Medication List    START taking these medications   Details  aspirin EC 325 MG EC tablet Take 1 tablet (325 mg total) by mouth daily. Qty: 30 tablet, Refills: 0    cephALEXin (KEFLEX) 500 MG capsule Take 1 capsule (500 mg total) by mouth every 12 (twelve) hours. Qty: 14 capsule, Refills: 0      CONTINUE these medications which have CHANGED   Details  cloNIDine (CATAPRES) 0.2 MG tablet Take 1 tablet (0.2 mg total) by mouth 3 (three) times  daily. Qty: 30 tablet, Refills: 0    insulin glargine (LANTUS) 100 UNIT/ML injection Inject 0.3 mLs (30 Units total) into the skin daily. Qty: 10 mL, Refills: 11      CONTINUE these medications which have NOT CHANGED   Details  carvedilol (COREG) 25 MG tablet Take 1 tablet (25 mg total) by mouth 2 (two) times daily. Qty: 60 tablet, Refills: 0    ferrous sulfate 325 (65 FE) MG tablet Take 1 tablet (325 mg total) by mouth daily with breakfast. Qty: 30 tablet, Refills: 0    furosemide (LASIX) 40 MG tablet Take 2 tablets (80 mg total) by mouth 2 (two) times daily. Qty: 60 tablet, Refills: 0    insulin aspart (NOVOLOG) 100 UNIT/ML injection Inject 2-10 Units into the skin 3 (three) times daily before meals. SSI: 0-200 = 2 units, 201-250 = 4 units,  251-300 = 6 units, 301-350 = 8 units, 351-400 = 10 units    levETIRAcetam (KEPPRA) 500 MG tablet Take 1 tablet (500 mg total) by mouth 2 (two) times daily. Qty: 60 tablet, Refills: 0    Multiple Vitamins-Minerals (SENIOR MULTIVITAMIN PLUS PO) Take 1 tablet by mouth every morning.    polyethylene glycol (MIRALAX / GLYCOLAX) packet Take 17 g by mouth 2 (two) times daily.    folic acid (FOLVITE) A999333 MCG tablet Take 400 mcg by mouth every morning.      STOP taking these medications     simethicone (MYLICON) 80 MG chewable tablet      bisacodyl (DULCOLAX) 5 MG EC tablet      DULoxetine (CYMBALTA) 30 MG capsule      oxyCODONE-acetaminophen (PERCOCET) 10-325 MG per tablet      oxyCODONE-acetaminophen (PERCOCET/ROXICET) 5-325 MG per tablet      silver sulfADIAZINE (SILVADENE) 1 % cream      sulfamethoxazole-trimethoprim (BACTRIM DS,SEPTRA DS) 800-160 MG per tablet        Allergies  Allergen Reactions  . Gabapentin Itching and Swelling  . Saphris [Asenapine] Swelling    Swelling of the face  . Tramadol Swelling    Causes legs to swell  . Penicillins Hives    Has tolerated Cefepime and Ceftriaxone.   Follow-up Information    Follow up with WEBB, CAROL, D, MD In 1 week.   Specialty:  Family Medicine   Contact information:   [address] [place] [address] [phone]       Follow up with DETERDING,JAMES L, MD.   Specialty:  Nephrology   Why:  keep appointment   Contact information:   [place] [address] [phone]        The results of significant diagnostics from this hospitalization (including imaging, microbiology, ancillary and laboratory) are listed below for reference.    Significant Diagnostic Studies: Dg Chest 2 View  08/09/2014   CLINICAL DATA:  Shortness of breath for 1 week with bilateral leg swelling.  EXAM: CHEST  2 VIEW  COMPARISON:  06/22/2014.  FINDINGS: The cardiac silhouette, mediastinal and hilar contours  are within normal limits and stable. The lungs are clear. No findings for CHF, focal infiltrate or pleural effusion. The bony thorax is intact.  IMPRESSION: No acute cardiopulmonary findings.   Electronically Signed   By: Kalman Jewels M.D.   On: 08/09/2014 15:45   Nm Myocar Multi W/spect W/wall Motion / Ef  08/26/2014   CLINICAL DATA:  Chest pain  EXAM: MYOCARDIAL IMAGING WITH SPECT (REST AND PHARMACOLOGIC-STRESS)  GATED LEFT  VENTRICULAR WALL MOTION STUDY  LEFT VENTRICULAR EJECTION FRACTION  TECHNIQUE: Standard myocardial SPECT imaging was performed after resting intravenous injection of 10 mCi Tc-38m sestamibi. Subsequently, intravenous infusion of Lexiscan was performed under the supervision of the Cardiology staff. At peak effect of the drug, 30 mCi Tc-3m sestamibi was injected intravenously and standard myocardial SPECT imaging was performed. Quantitative gated imaging was also performed to evaluate left ventricular wall motion, and estimate left ventricular ejection fraction.  COMPARISON:  None.  FINDINGS: Perfusion: No decreased activity in the left ventricle on stress imaging to suggest reversible ischemia or infarction. Minimal attenuation artifact in the inferior wall is noted.  Wall Motion: Mild nonspecific septal hypokinesis.  Left Ventricular Ejection Fraction: 48 %  End diastolic volume [id number] ml  End systolic volume 65 ml  IMPRESSION: 1. No reversible ischemia or infarction.  2. Nonspecific mild septal hypokinesis.  3. Left ventricular ejection fraction 48%  4. Intermediate-risk stress test findings*.  *2012 Appropriate Use Criteria for Coronary Revascularization Focused Update: J Am Coll Cardiol. [id number]. http://content.airportbarriers.com.aspx?articleid=1201161   Electronically Signed   By: Maryclare Bean M.D.   On: 08/26/2014 12:57   Dg Chest Port 1 View  08/24/2014   CLINICAL DATA:  Left-sided chest pain. Shortness of breath. History of CHF.  EXAM: PORTABLE CHEST - 1 VIEW   COMPARISON:  08/09/2014.  FINDINGS: Mild enlargement of the cardiac silhouette. Normal mediastinal and hilar contours.  Clear lungs.  No pleural effusion or pneumothorax.  Bony thorax is unremarkable.  IMPRESSION: No active disease.   Electronically Signed   By: Lajean Manes M.D.   On: 08/24/2014 23:20    Microbiology: Recent Results (from the past 240 hour(s))  MRSA PCR Screening     Status: None   Collection Time: 08/25/14  2:26 AM  Result Value Ref Range Status   MRSA by PCR NEGATIVE NEGATIVE Final    Comment:        The GeneXpert MRSA Assay (FDA approved for NASAL specimens only), is one component of a comprehensive MRSA colonization surveillance program. It is not intended to diagnose MRSA infection nor to guide or monitor treatment for MRSA infections.      Labs: Basic Metabolic Panel:  Recent Labs Lab 08/24/14 2319 08/25/14 0825 08/26/14 0312 08/27/14 0020  NA 137 134* 138 133*  K 2.9* 3.4* 3.4* 4.0  CL 98 98 102 98  CO2 30 28 27 27   GLUCOSE 274* 475* 256* 375*  BUN 38* 37* 43* 42*  CREATININE 4.09* 3.94* 4.19* 4.20*  CALCIUM 7.7* 7.4* 7.7* 7.9*  MG  --  1.4*  --   --    Liver Function Tests: No results for input(s): AST, ALT, ALKPHOS, BILITOT, PROT, ALBUMIN in the last 168 hours. No results for input(s): LIPASE, AMYLASE in the last 168 hours. No results for input(s): AMMONIA in the last 168 hours. CBC:  Recent Labs Lab 08/24/14 2319 08/26/14 0312 08/27/14 0020  WBC 3.1* 2.4* 2.2*  NEUTROABS 1.1*  --   --   HGB 9.3* 7.9* 8.8*  HCT 26.9* 23.7* 26.6*  MCV 85.1 87.8 86.4  PLT 157 138* 155   Cardiac Enzymes:  Recent Labs Lab 08/24/14 2319 08/25/14 0403 08/25/14 0825  TROPONINI <0.03 0.03  0.03 <0.03   BNP: BNP (last 3 results)  Recent Labs  02/23/14 0224 04/12/14 0520 06/22/14 1532  PROBNP 2305.0* 12801.0* 28725.0*   CBG:  Recent Labs Lab 08/26/14 1304 08/26/14 1648 08/26/14 2049 08/27/14 0747 08/27/14 1133  GLUCAP 241* 240*  374* 161* 167*       Signed:  Taesean Reth  [facility] 08/27/2014, 12:33 PM

## 2014-08-28 IMAGING — MR MR FOOT*L* W/O CM
4 of 6 series · 19 of 40 positions shown · non-contrast
Comparison: Radiographs dated 05/17/2013

CLINICAL DATA: Cellulitis with soft tissue ulcerations of the mid
foot on the dorsal and plantar aspects between the 3rd and 4th rays.

EXAM:
MRI OF THE LEFT FOREFOOT WITHOUT CONTRAST
TECHNIQUE: Multiplanar, multisequence MR imaging was performed. No intravenous
contrast was administered.

[Series 4: T1 · coronal · 4.0mm · 0.29mm/px · 8 of 30 slices shown (1 of 2)]
[im 1/30]
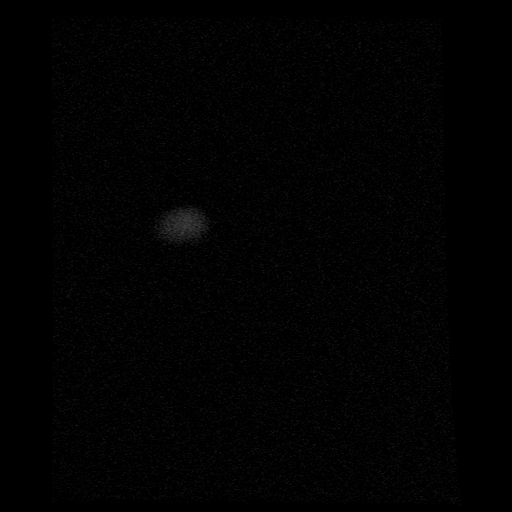
[im 4/30]
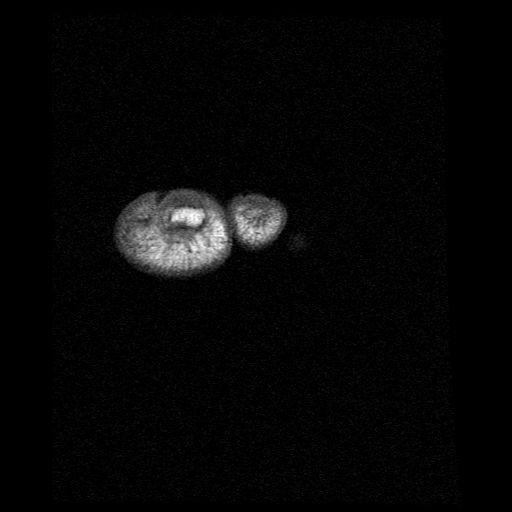
[im 10/30]
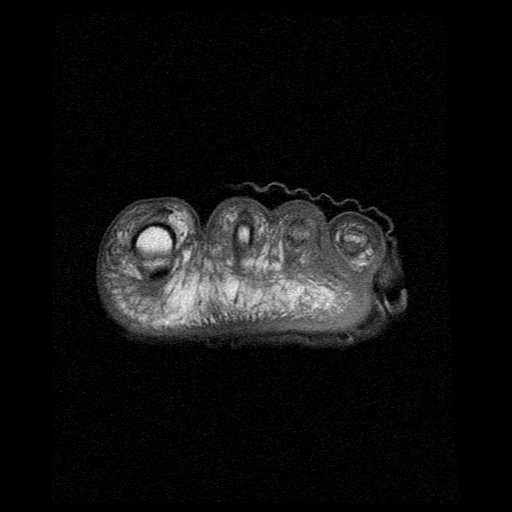
[im 13/30]
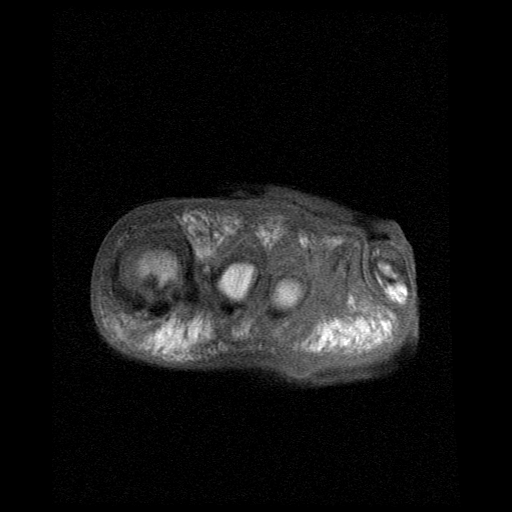
[im 17/30]
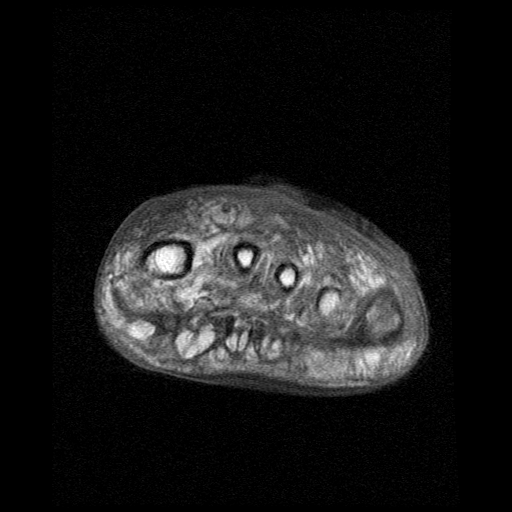
[im 20/30]
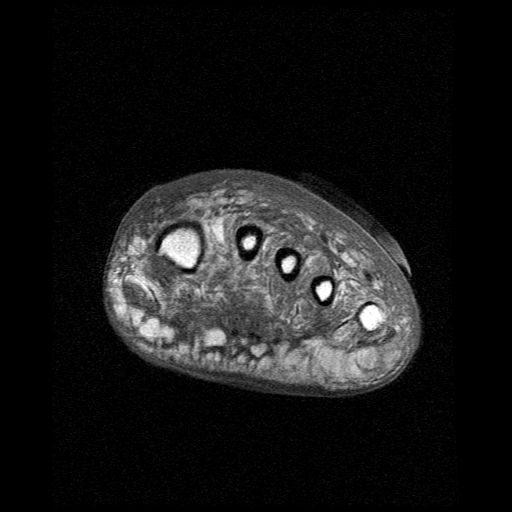
[im 26/30]
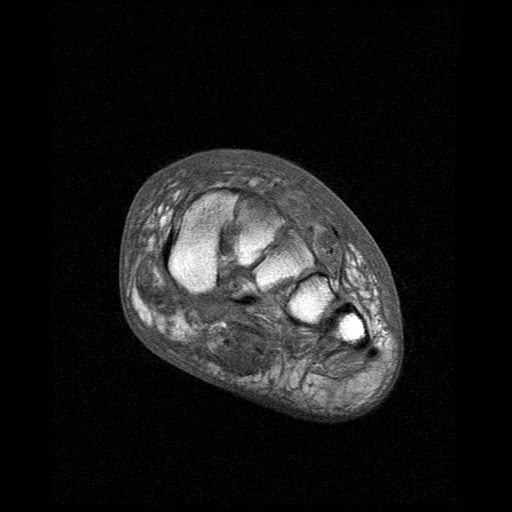
[im 30/30]
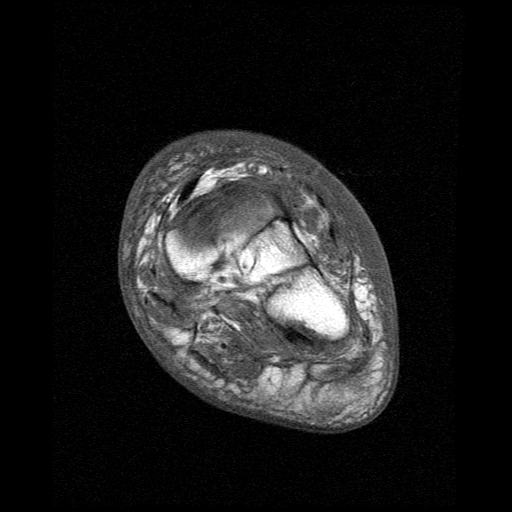

[Series 6: T1 · axial · 4.0mm · 0.29mm/px · z∈[-59,+7]mm · 5 of 16 slices shown (2 of 2)]
[im 1/16]
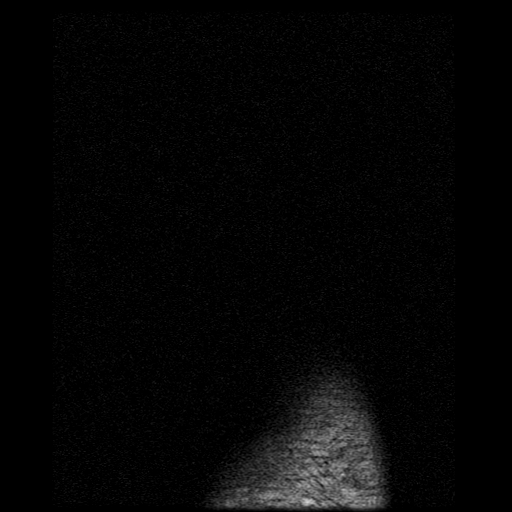
[im 4/16]
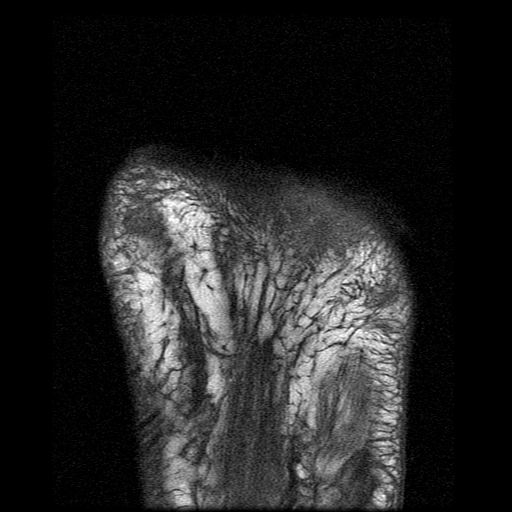
[im 8/16]
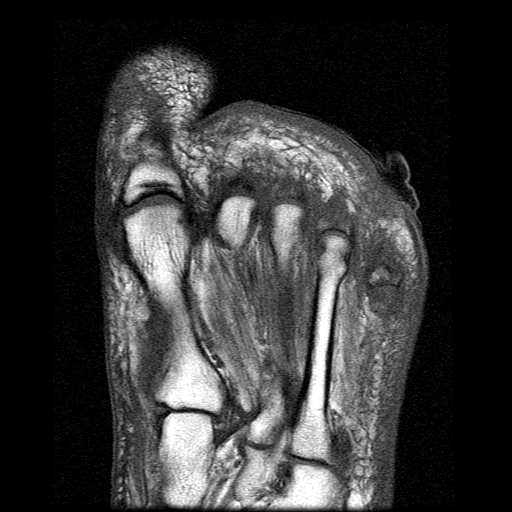
[im 12/16]
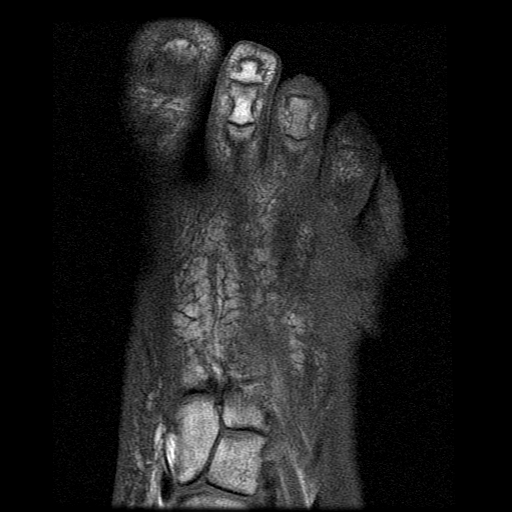
[im 16/16]
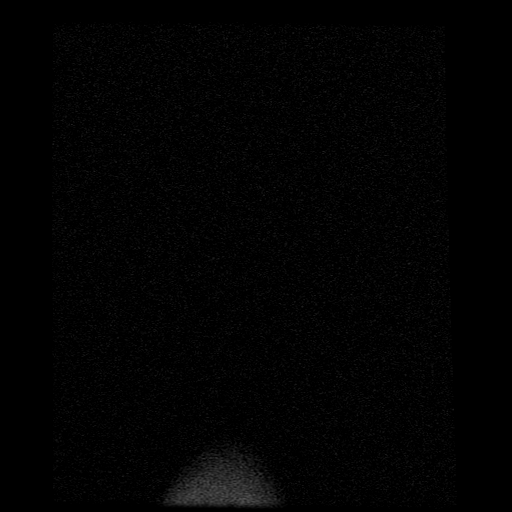

[Series 7: T2 · axial · 4.0mm · 0.29mm/px · z∈[-59,+7]mm · 3 of 16 slices shown (1 of 2)]
[im 1/16]
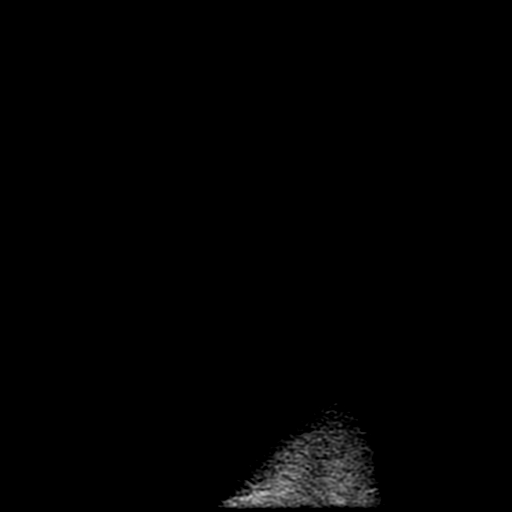
[im 8/16]
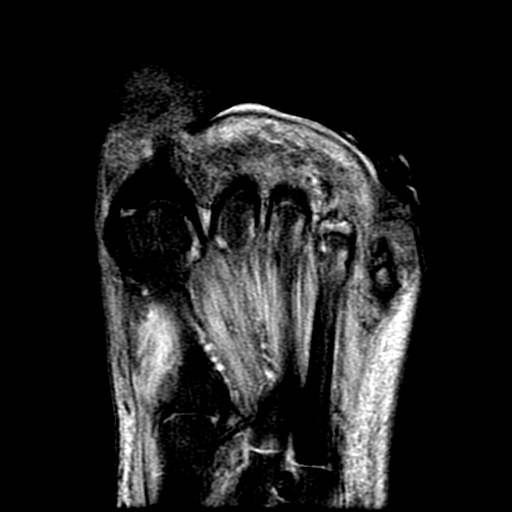
[im 16/16]
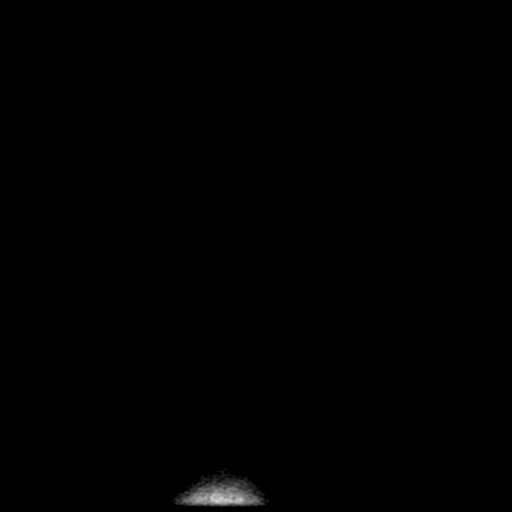

[Series 8: T2 · coronal · 4.0mm · 0.29mm/px · 3 of 30 slices shown (2 of 2)]
[im 4/30]
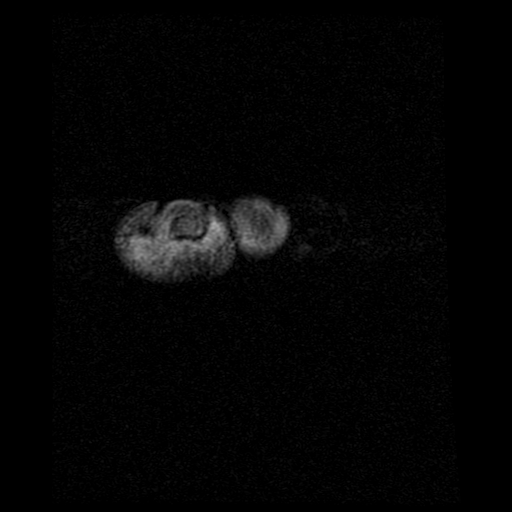
[im 15/30]
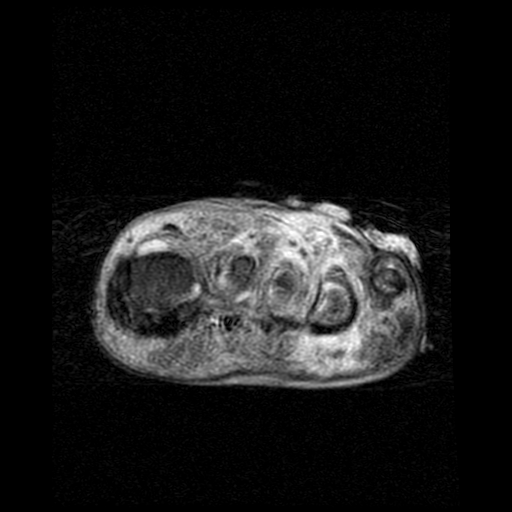
[im 26/30]
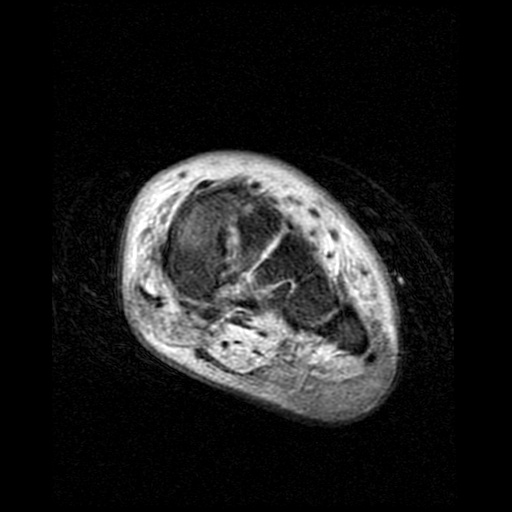

[19 of 40 positions shown; findings below may reference images not displayed]

FINDINGS: There is a osteomyelitis of the proximal phalanx of the 4th toe with
some destruction of the medial cortex of the midshaft. There is also
effusion at the 4th metatarsal phalangeal joint consistent with a
septic joint.

The soft tissue ulcers are noted on the dorsal and plantar aspects
of the foot at approximately the level of the metatarsal phalangeal
joints between the 3rd and 4th rays. There are poorly defined fluid
collections in the soft tissues deep to these ulcerations. The
dorsal pus collection extends approximately 2 cm proximal to the 4th
metatarsal phalangeal joint. There is no more proximal extension of
pus in the foot. There is diffuse subcutaneous edema primarily on
the dorsum of the foot.

The tendons and muscle structures demonstrate no acute
abnormalities.
IMPRESSION: 1. Osteomyelitis of the proximal phalanx of the 4th toe.
2. Cellulitis of the forefoot primarily around the 3rd and 4th
metatarsal phalangeal joints. Small poorly defined pus collections
deep to the dorsal and plantar ulcers. No proximal soft tissue
abscesses.

## 2014-08-29 IMAGING — CR DG ABDOMEN 2V
2 series · 2 of 2 positions shown · non-contrast
Comparison: None.

CLINICAL DATA: Abdominal pain with vomiting today.

EXAM:
ABDOMEN - 2 VIEW

[w abdomen upright]
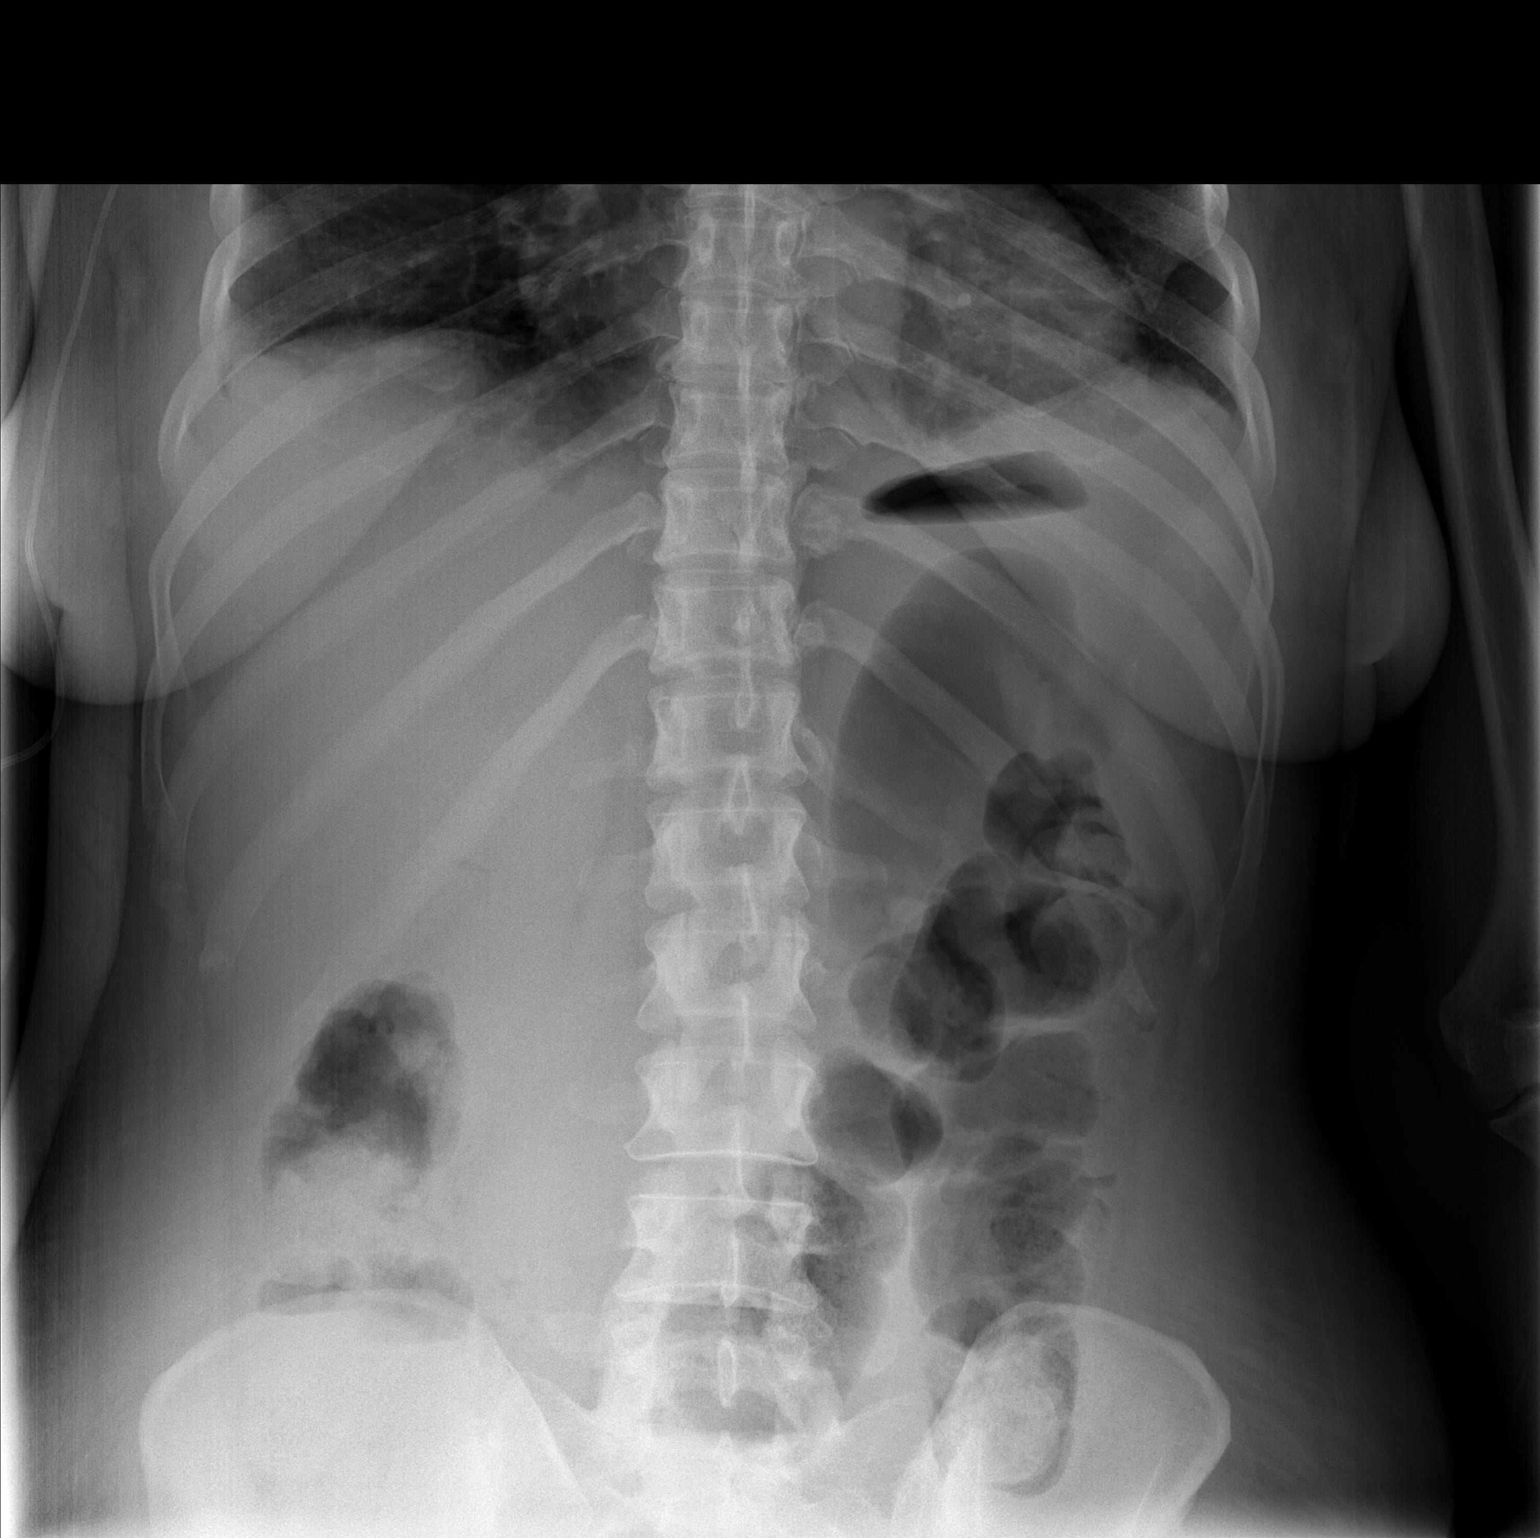

[t abdomen supine]
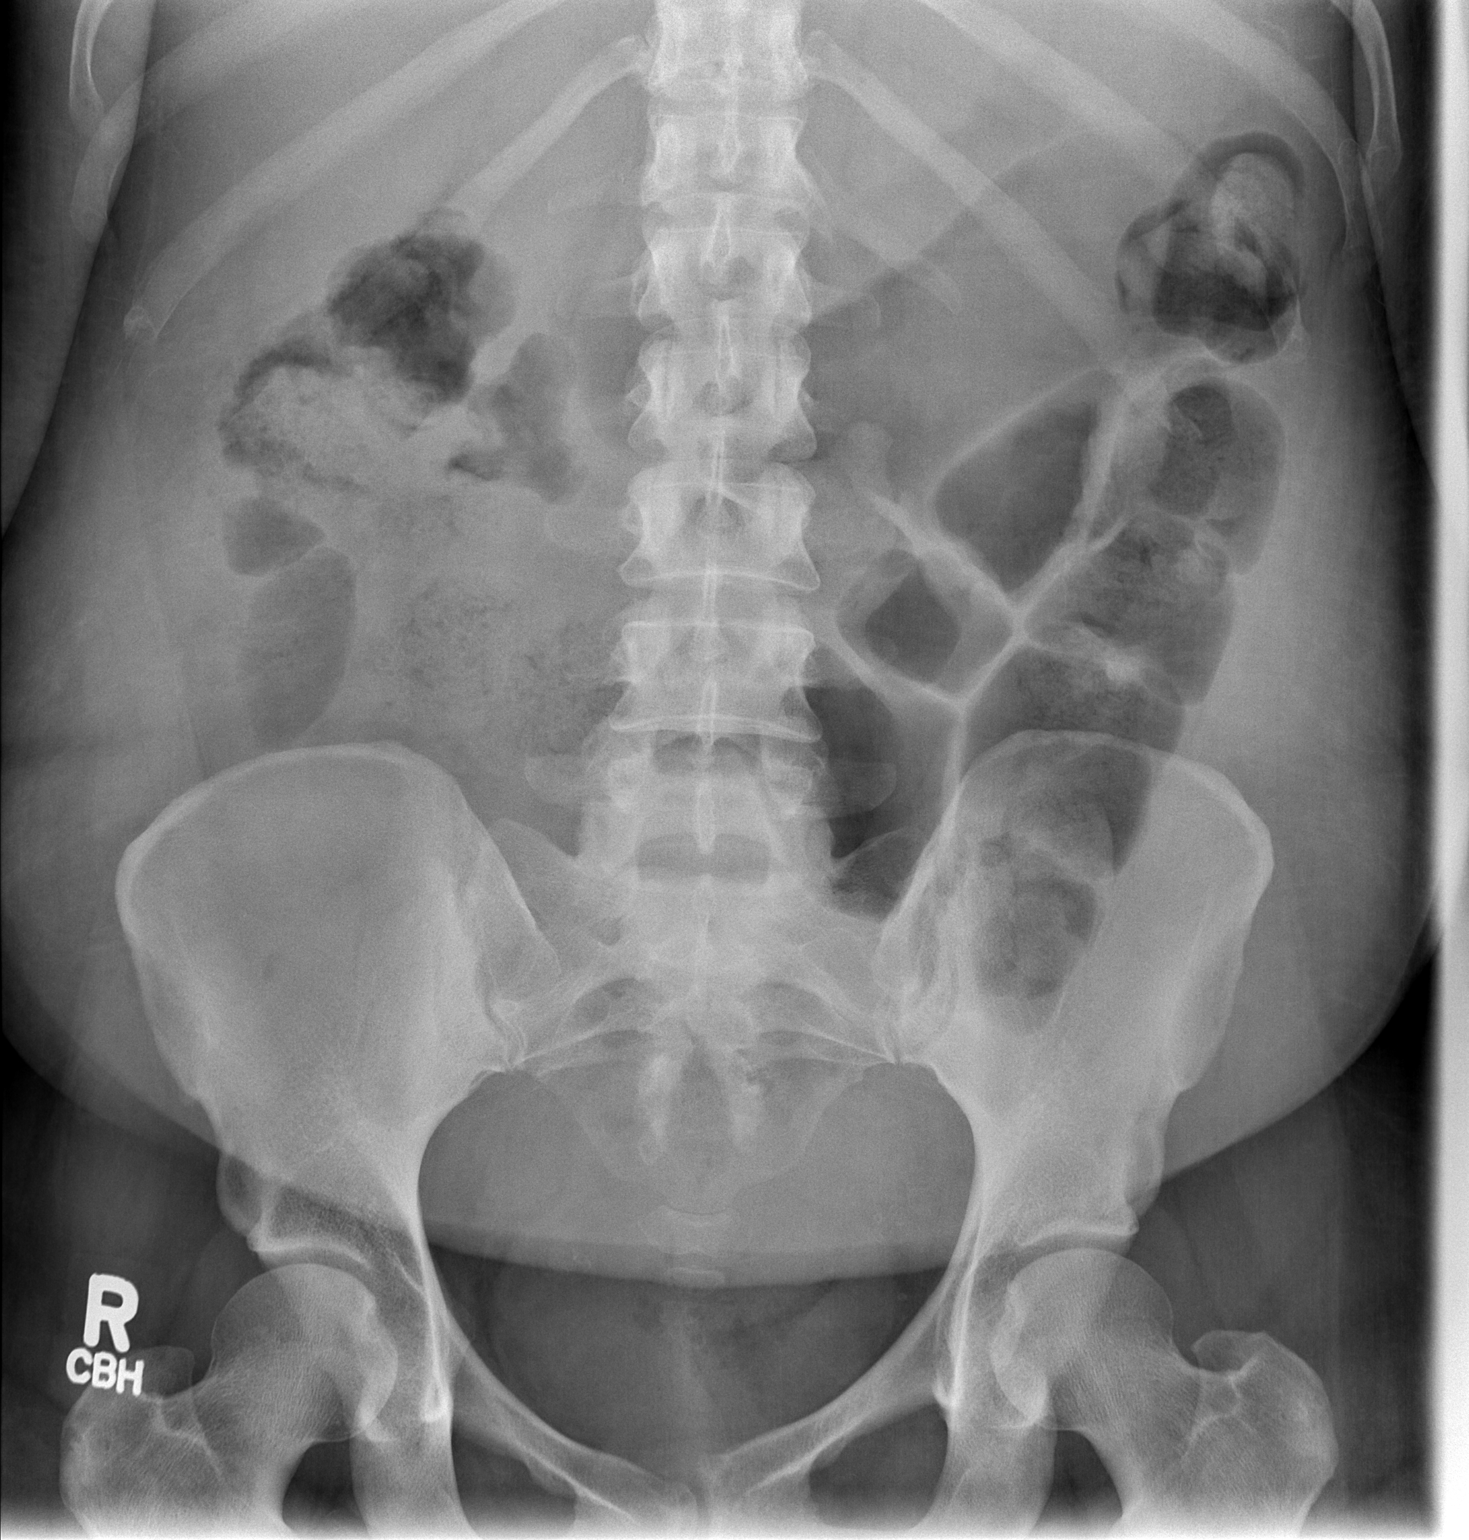

[2 of 2 positions shown; findings below may reference images not displayed]

FINDINGS: The bowel gas pattern is normal. There is no evidence of free air.
No radio-opaque calculi or other significant radiographic
abnormality is seen.
IMPRESSION: Negative.

## 2014-09-11 IMAGING — CR DG FOOT 2V*L*
2 series · 2 of 2 positions shown · non-contrast
Comparison: MRI of the left foot May 19, 2013

CLINICAL DATA: Left foot pain and stump drainage.

EXAM:
LEFT FOOT - 2 VIEW

[x foot ap left]
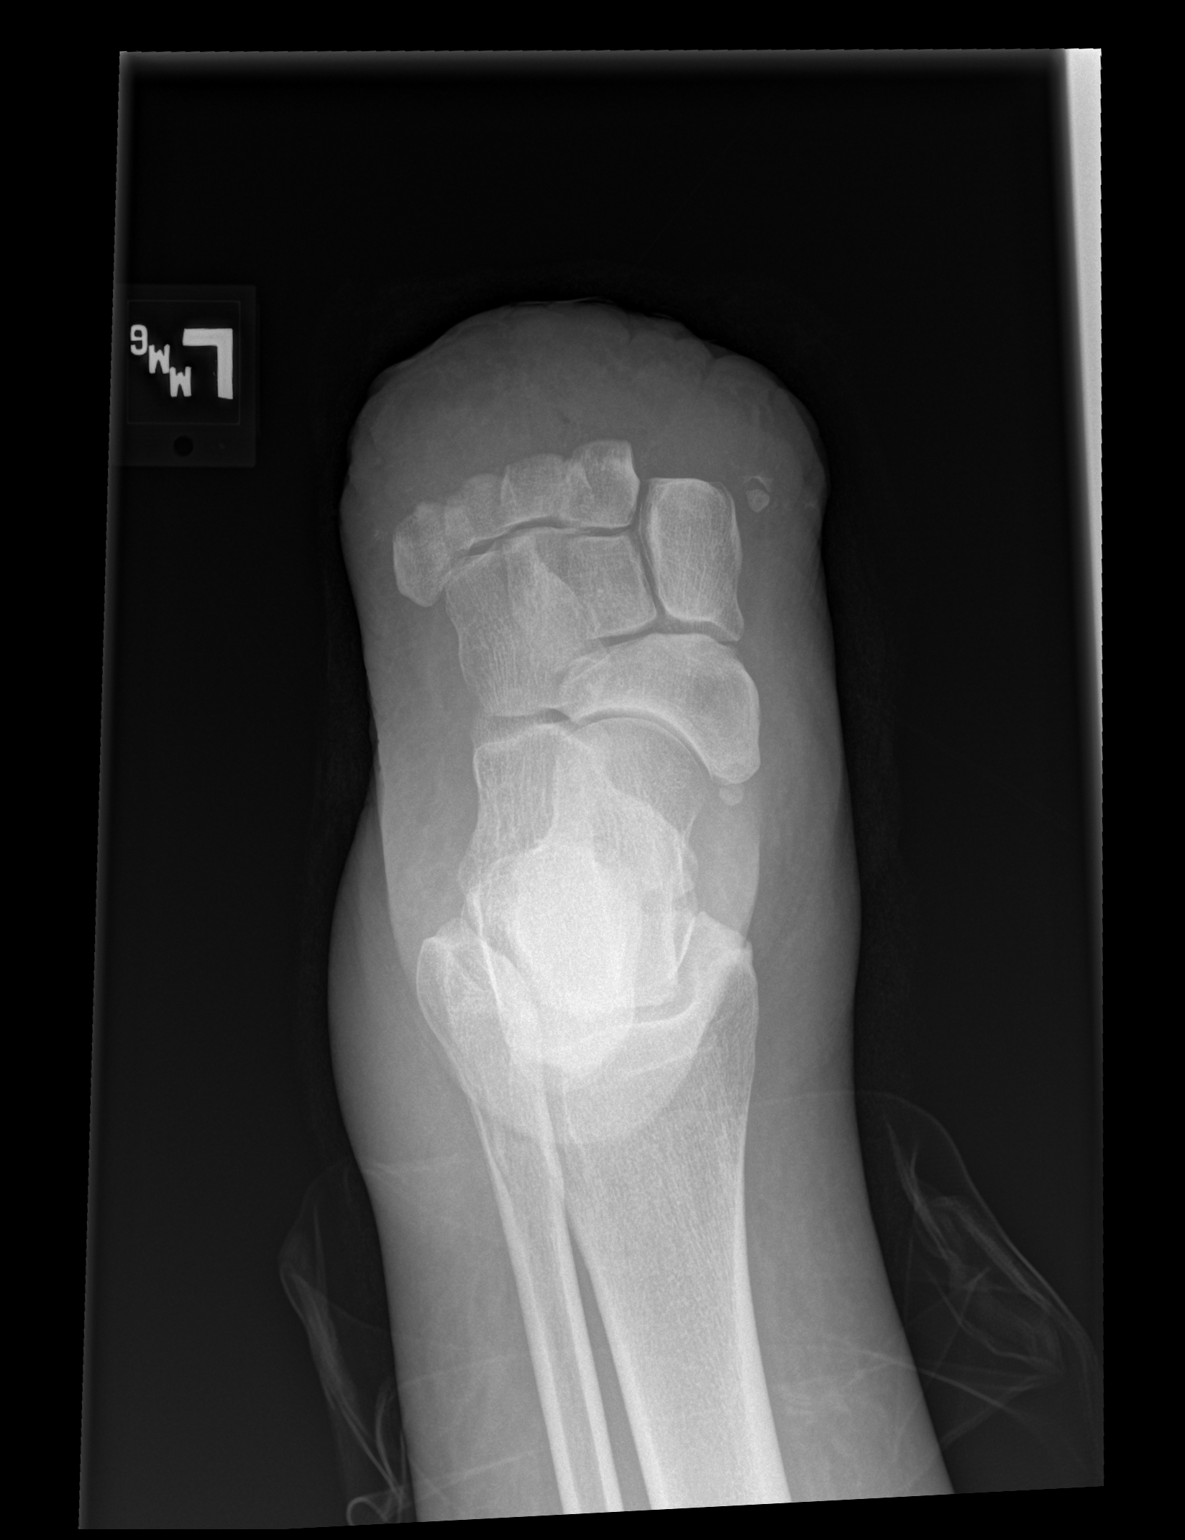

[x foot lat left]
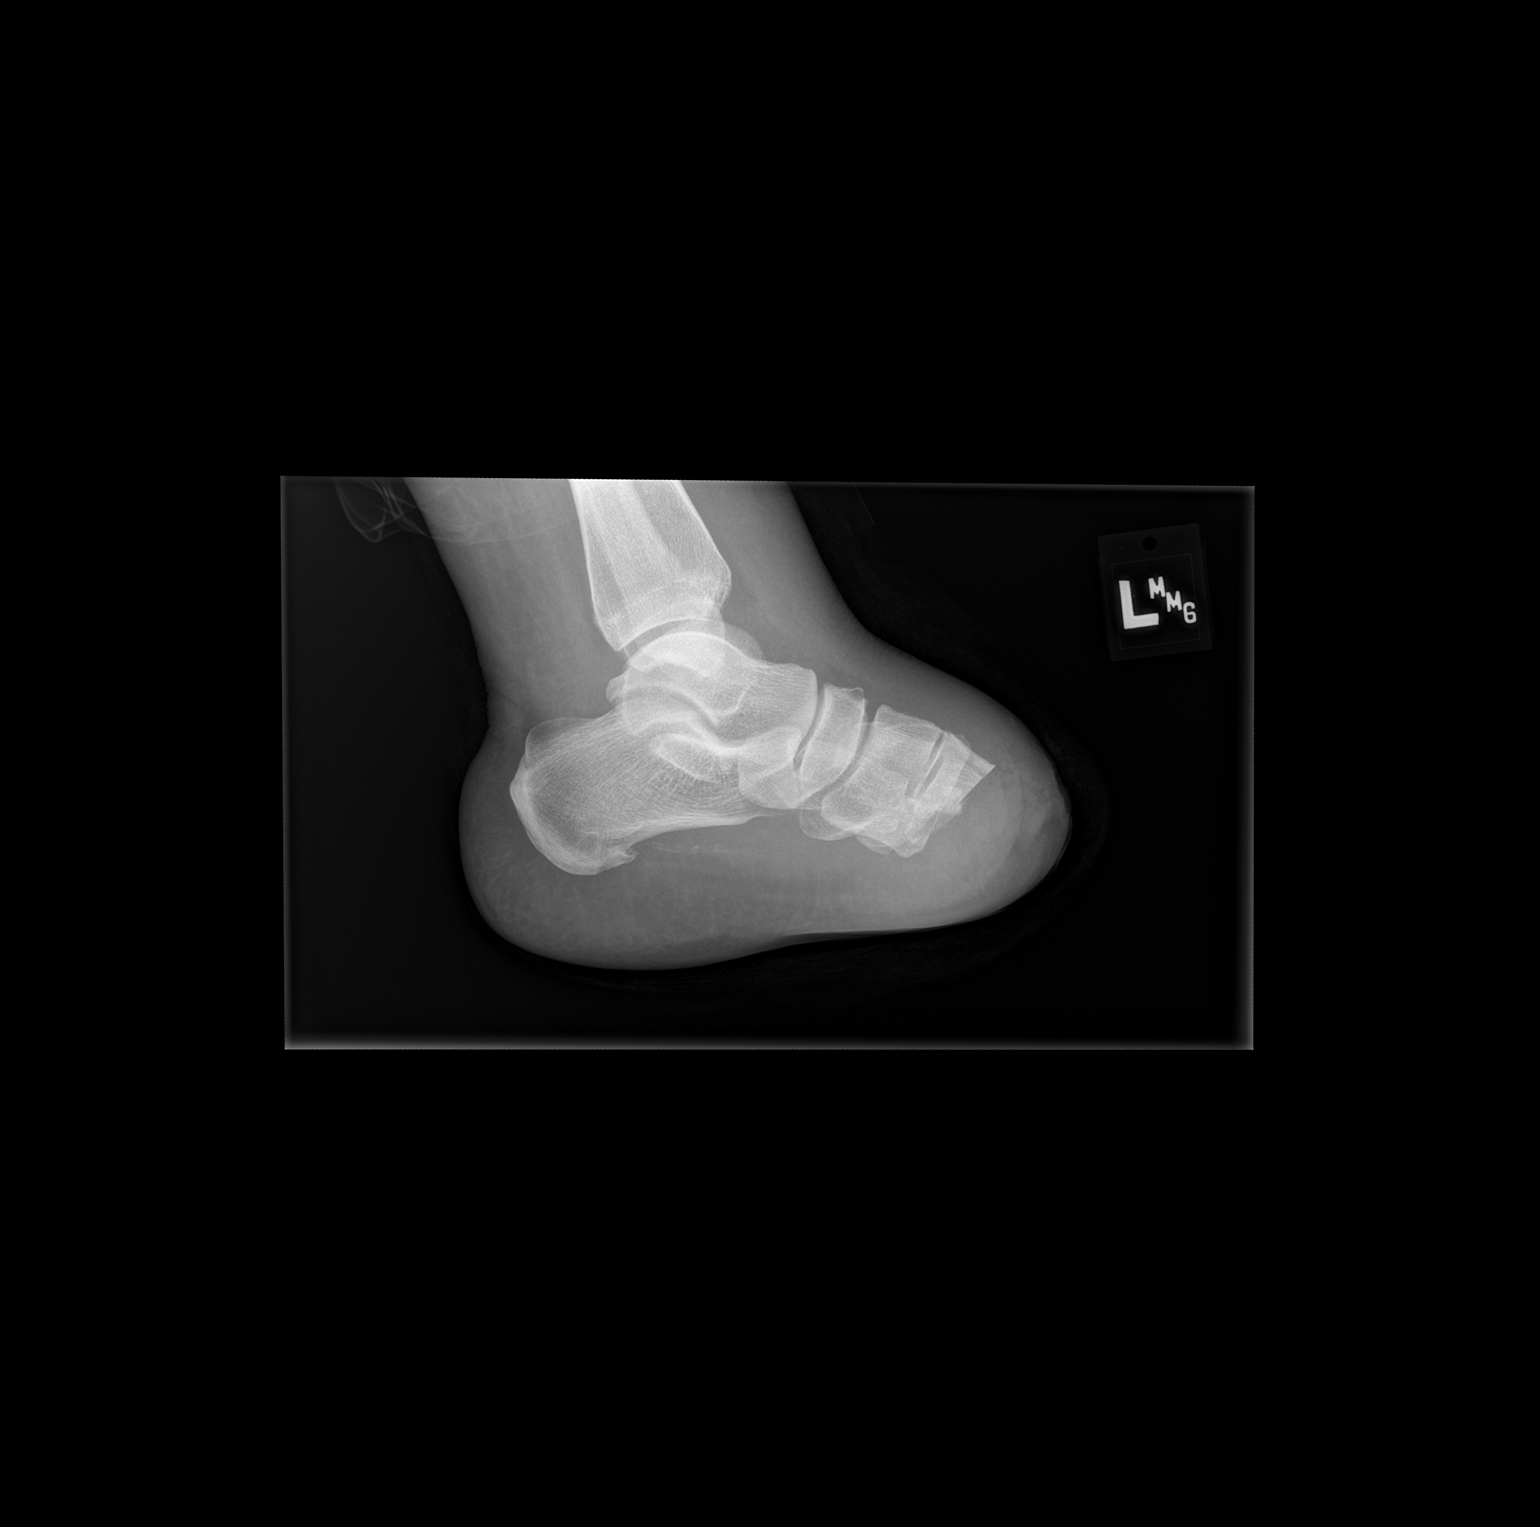

[2 of 2 positions shown; findings below may reference images not displayed]

FINDINGS: Status post transmetatarsal amputation, minimal osteopenia lung the
lateral aspect of the medial cuneiform seen only on the frontal
radiograph. No fracture deformity or dislocation. Type 2 navicular
bone. Mild irregularity of the stomach soft tissues Common a
radiopaque foreign bodies ; ankle soft tissue swelling.
IMPRESSION: Status post transmetatarsal amputation, with expected postoperative
changes; slight lucency along the lateral aspect of the medial
cuneiform, which may be projectional though, if clinical concern for
recurrent osteomyelitis consider repeat MRI of the foot. No acute
fracture deformity or dislocation.

  By: Ferienhaus Erxleben

## 2014-09-12 IMAGING — MR MR FOOT*L* WO/W CM
6 of 8 series · 30 of 40 positions shown · IV contrast (multihance)
Comparison: Radiographs 06/02/2013. Preoperative MRI 05/19/2013.

CLINICAL DATA: Left foot swelling following amputation for
osteomyelitis earlier this month. Patient fell injuring her foot
after surgery. Renal insufficiency.

EXAM:
MRI OF THE LEFT FOREFOOT WITHOUT AND WITH CONTRAST
TECHNIQUE: Multiplanar, multisequence MR imaging was performed both before and
after administration of intravenous contrast.
CONTRAST:  8mL MULTIHANCE GADOBENATE DIMEGLUMINE 529 MG/ML IV SOLN

[Series 6: STIR · sagittal · 4.0mm · 0.33mm/px · 4 of 19 slices shown]
[im 1/19]
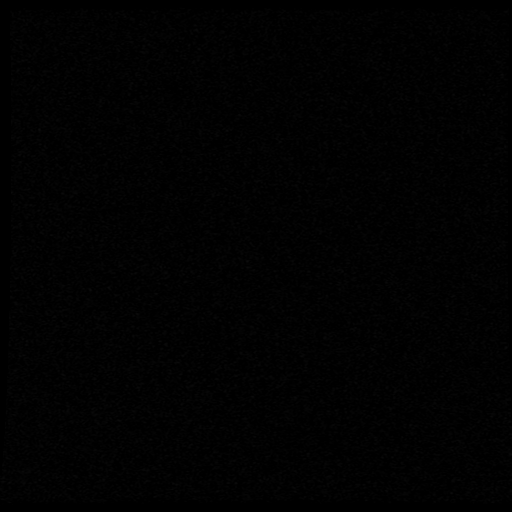
[im 7/19]
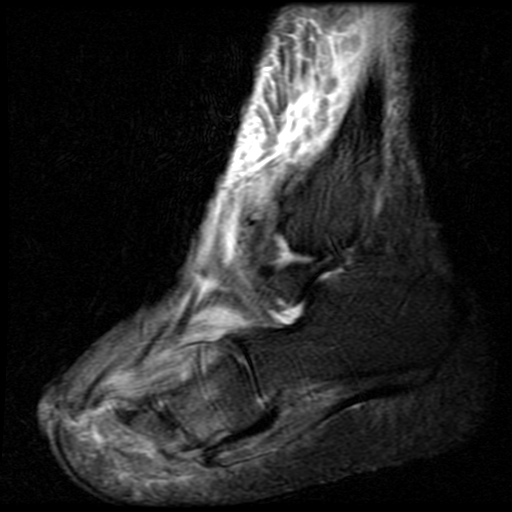
[im 13/19]
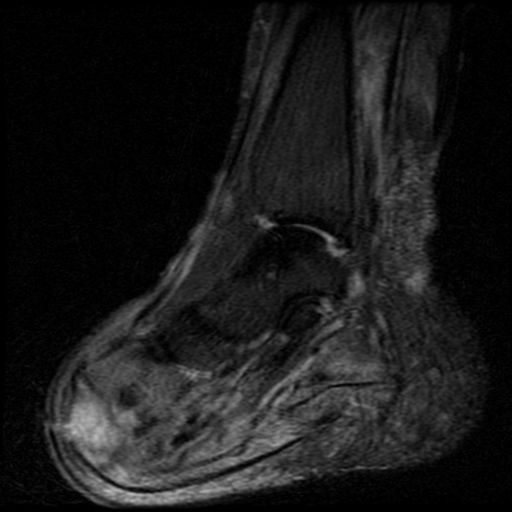
[im 19/19]
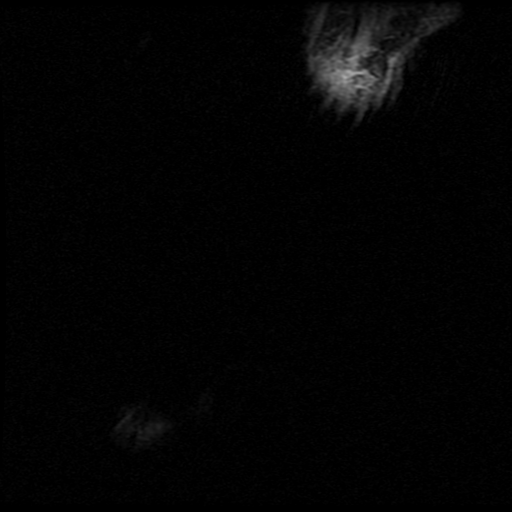

[Series 8: T1 · coronal · 4.0mm · 0.31mm/px · 6 of 34 slices shown (1 of 2)]
[im 1/34]
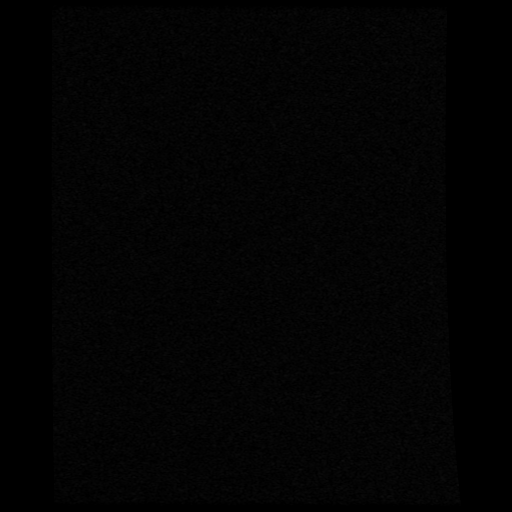
[im 7/34]
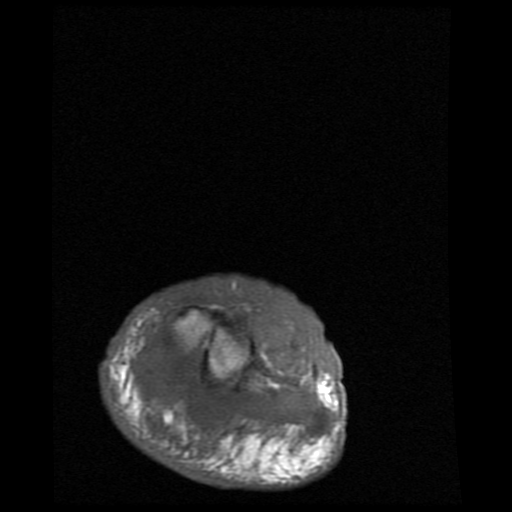
[im 14/34]
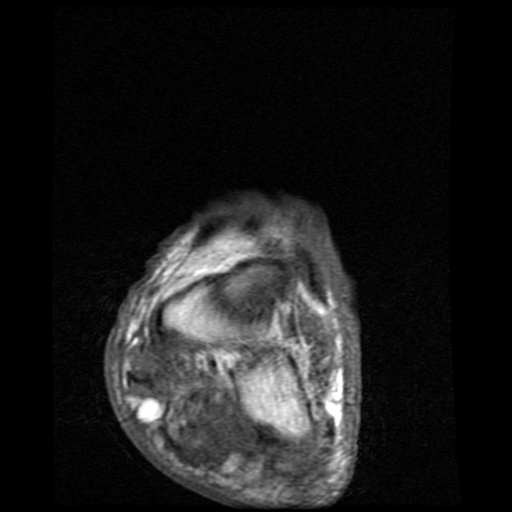
[im 20/34]
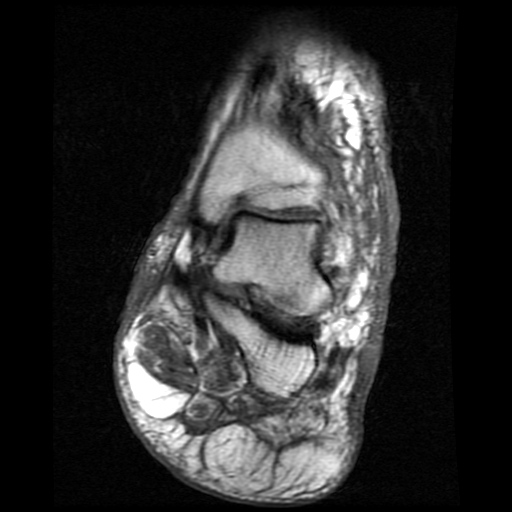
[im 27/34]
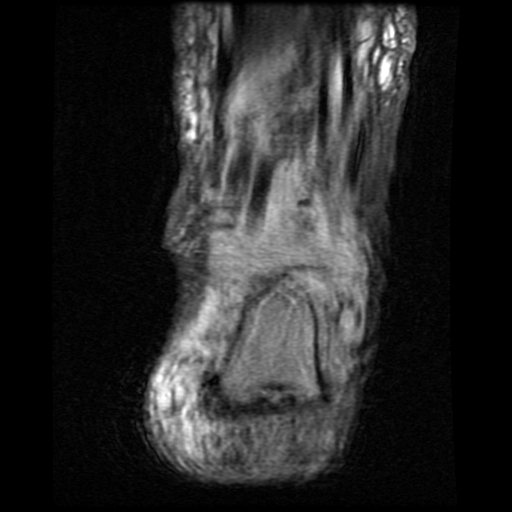
[im 34/34]
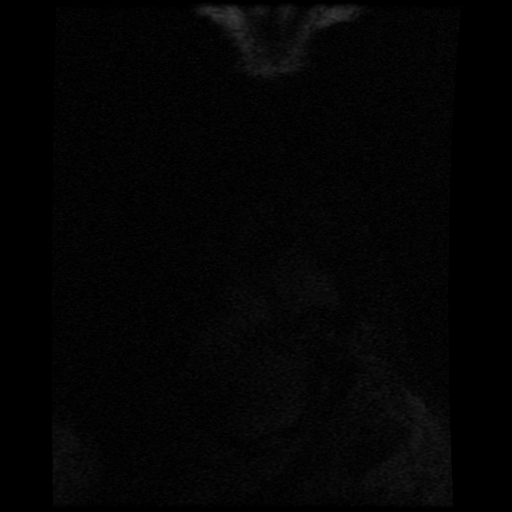

[Series 10: T1 · axial · 4.0mm · 0.35mm/px · z∈[-98,+23]mm · 4 of 26 slices shown (2 of 2)]
[im 1/26]
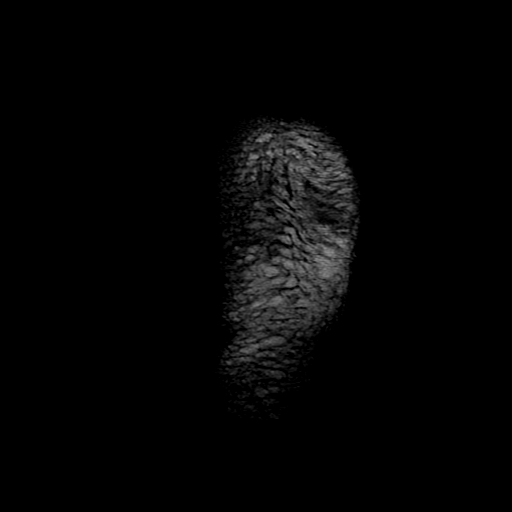
[im 9/26]
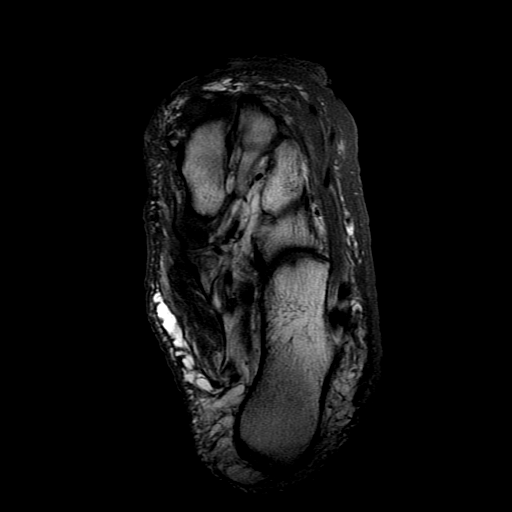
[im 17/26]
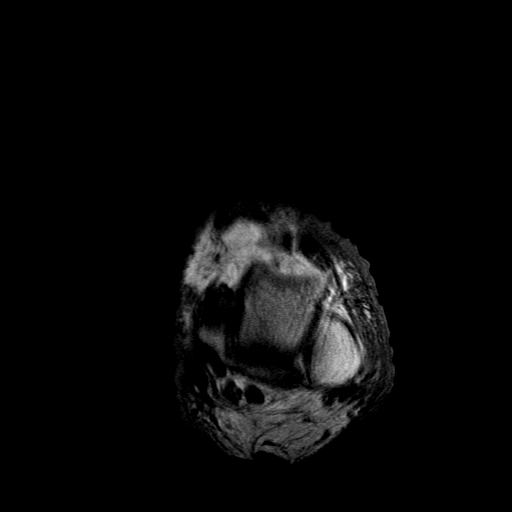
[im 26/26]
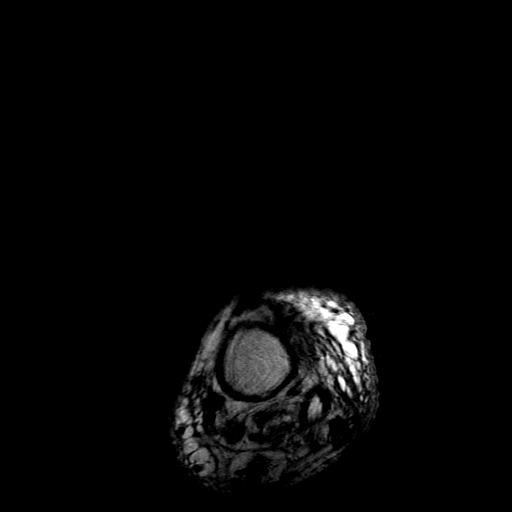

[Series 11: T1 fat-sat · coronal · non-contrast · 4.0mm · 0.62mm/px · 6 of 34 slices shown]
[im 1/34]
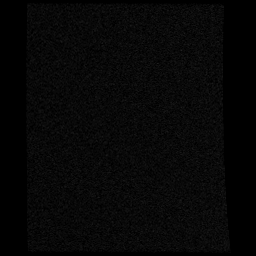
[im 7/34]
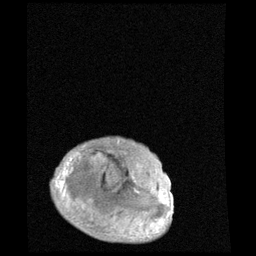
[im 14/34]
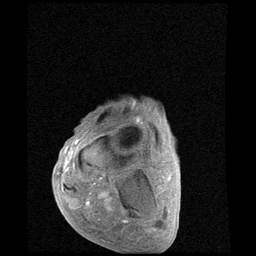
[im 20/34]
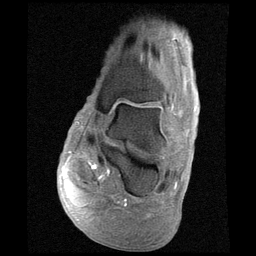
[im 27/34]
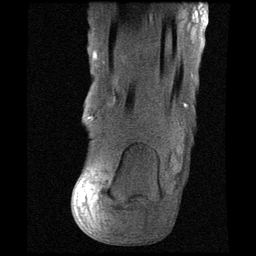
[im 34/34]
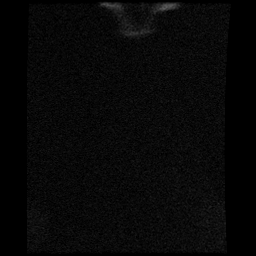

[Series 12: T1 fat-sat post-contrast · coronal · 4.0mm · 0.62mm/px · 6 of 34 slices shown]
[im 1/34]
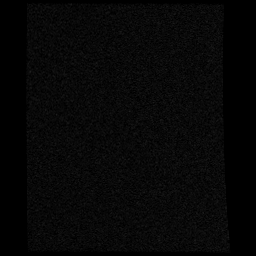
[im 7/34]
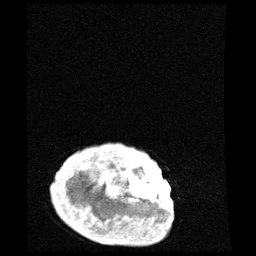
[im 14/34]
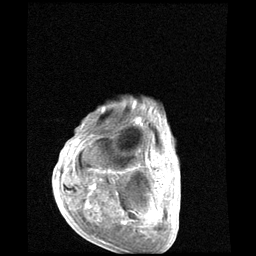
[im 20/34]
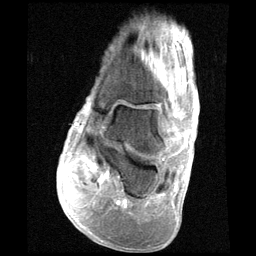
[im 27/34]
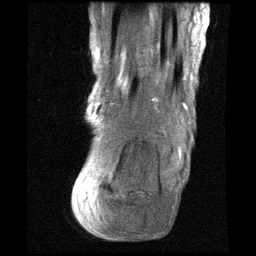
[im 34/34]
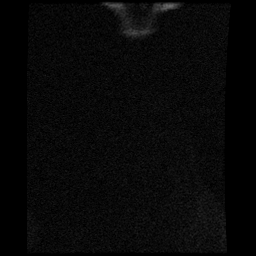

[Series 13: T1 post-contrast · axial · 4.0mm · 0.70mm/px · z∈[-98,+23]mm · 4 of 26 slices shown]
[im 1/26]
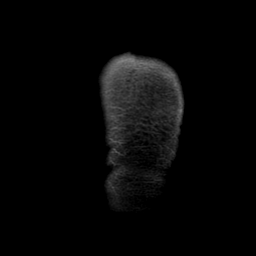
[im 9/26]
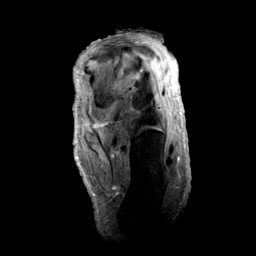
[im 17/26]
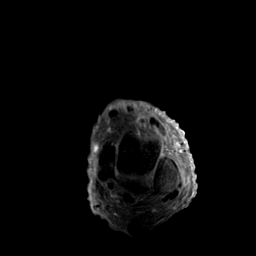
[im 26/26]
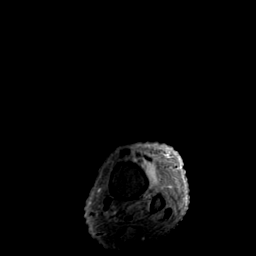

[30 of 40 positions shown; findings below may reference images not displayed]

FINDINGS: Study is mildly motion degraded. The remaining foot is imaged.

The patient has undergone interval transmetatarsal amputation as
demonstrated on the recent radiographs. There is a complex
peripherally enhancing fluid collection within the surgical bed,
measuring approximately 6.3 x 2.6 x 2.5 cm. Enhancing fluid tracks
proximally along the extensor tendon sheaths. No other focal fluid
collections are identified. There is diffuse subcutaneous edema
within the ankle and hindfoot.

There is nonspecific low level edema within the amputated metatarsal
bases. There is low-level edema and enhancement within the cuboid
and cuneiform bones. No cortical destruction is identified. The
talus, navicular and calcaneus appear normal.
IMPRESSION: 1. Postsurgical changes status post recent transmetatarsal
amputation.
2. There is a peripheral enhancing fluid collection within the
surgical bed which is nonspecific this soon following surgery. This
could reflect a postoperative hematoma/ seroma. An abscess cannot be
excluded.
3. Nonspecific low-level marrow edema and enhancement within the
partially amputated metatarsals as well as the cuboid and cuneiform
bones. Some degree of hyperemia in these bones is expected related
to the recent surgery, and there is no cortical destruction to
strongly suggest recurrent osteomyelitis.

## 2014-09-19 NOTE — Progress Notes (Signed)
Cardiology Office Note   Date:  09/20/2014   ID:  [name], DOB March 10, 1966, MRN [id number]  PCP:  [name], MD  Cardiologist:   Sueanne Margarita, MD   Chief Complaint  Patient presents with  . Arm Pain  . preoperative clearance  . Chest Pain      History of Present Illness: [name] is a 49 y.o. female who presents for evaluation of chest pain.  That patient has a history of bipolar d/o with schizoaffective d/o, CHF, type II DM with neuropathy, dyslipidemia, HTN, Hep C and CKD stage III.  She was admitted to [facility] on 08/24/2014 with complaints of CP in setting of hypertensive urgency with SBP >263mmHg.  The pain was under her left breast and started after she ate.  There was no radiation of the pain. She was SOB.   She apparently had not been taking her BP meds at home and her BP was elevated on admission.  She ruled out for MI and it was felt that her CP was due to poorly controlled HTN with demand supply mismatch.  She underwent nuclear stress test showing no ischemia and mild septal HK with EF 48%.  No further cardiac workup was recommended.  She is now here for cardiac clearance for elective arm surgery.  Since her stress test she has not had any further chest pain.  Her BP meds were adjusted and her BP control has been much better.  She has some mild DOE which is chronic.  She has occasionally has some mild LE edema which she has had chronically from her CKD.  She denies any syncope or palpitations.      Past Medical History  Diagnosis Date  . Neuropathy   . Glaucoma   . Bipolar 1 disorder   . Refusal of blood transfusions as patient is Jehovah's Witness   . Hypertension   . Shortness of breath   . TIA (transient ischemic attack) 2010  . Neuromuscular disorder     DIABETIC NEUROPATHY  . MRSA infection ?2014    "on the back of my head; spread throughout my bloodstream"  . Bipolar disorder, unspecified 02/06/2014  . High cholesterol   . Type II diabetes mellitus       INSULIN DEPENDENT  . Hepatitis C   . Chronic pain syndrome   . Chronic kidney disease (CKD), stage III (moderate)   . Schizoaffective disorder, unspecified condition 02/08/2014  . Seizure dx'd 2014    "don't know what kind; last one was ~ 04/2014"  . Chronic combined systolic and diastolic CHF (congestive heart failure)     EF 40% by echo and 48% by stress test    Past Surgical History  Procedure Laterality Date  . Amputation Left 05/21/2013    Procedure: Left Midfoot AMPUTATION;  Surgeon: Newt Minion, MD;  Location: [place];  Service: Orthopedics;  Laterality: Left;  Left Midfoot Amputation  . Tubal ligation    . Colonoscopy with propofol N/A 06/22/2013    Procedure: COLONOSCOPY WITH PROPOFOL;  Surgeon: Jeryl Columbia, MD;  Location: [facility];  Service: Endoscopy;  Laterality: N/A;  . Abdominal hysterectomy  2014  . Tonsillectomy  1970's  . Refractive surgery Bilateral 2013     Current Outpatient Prescriptions  Medication Sig Dispense Refill  . aspirin EC 325 MG EC tablet Take 1 tablet (325 mg total) by mouth daily. 30 tablet 0  . carvedilol (COREG) 25 MG tablet Take 1 tablet (  25 mg total) by mouth 2 (two) times daily. 60 tablet 0  . cephALEXin (KEFLEX) 500 MG capsule Take 1 capsule (500 mg total) by mouth every 12 (twelve) hours. 14 capsule 0  . cloNIDine (CATAPRES) 0.1 MG tablet   0  . ferrous sulfate 325 (65 FE) MG tablet Take 1 tablet (325 mg total) by mouth daily with breakfast. 30 tablet 0  . folic acid (FOLVITE) A999333 MCG tablet Take 400 mcg by mouth every morning.    . furosemide (LASIX) 40 MG tablet Take 2 tablets (80 mg total) by mouth 2 (two) times daily. 60 tablet 0  . insulin aspart (NOVOLOG) 100 UNIT/ML injection Inject 2-10 Units into the skin 3 (three) times daily before meals. SSI: 0-200 = 2 units, 201-250 = 4 units, 251-300 = 6 units, 301-350 = 8 units, 351-400 = 10 units    . insulin glargine (LANTUS) 100 UNIT/ML injection Inject 0.3 mLs (30 Units total) into  the skin daily. 10 mL 11  . levETIRAcetam (KEPPRA) 500 MG tablet Take 1 tablet (500 mg total) by mouth 2 (two) times daily. (Patient taking differently: Take 1,000 mg by mouth at bedtime. ) 60 tablet 0  . Multiple Vitamins-Minerals (SENIOR MULTIVITAMIN PLUS PO) Take 1 tablet by mouth every morning.    . mupirocin cream (BACTROBAN) 2 % Apply topically daily. 15 g 0  . OLANZapine (ZYPREXA) 5 MG tablet   0  . oxyCODONE-acetaminophen (PERCOCET/ROXICET) 5-325 MG per tablet Take 1 tablet by mouth every 6 (six) hours as needed for moderate pain. 15 tablet 0  . polyethylene glycol (MIRALAX / GLYCOLAX) packet Take 17 g by mouth 2 (two) times daily.    . pregabalin (LYRICA) 75 MG capsule Take 75 mg by mouth 2 (two) times daily.    Marland Kitchen zolpidem (AMBIEN) 5 MG tablet Take 5 mg by mouth at bedtime as needed for sleep.     No current facility-administered medications for this visit.    Allergies:   Gabapentin; Saphris; Tramadol; and Penicillins    Social History:  The patient  reports that she quit smoking about 3 months ago. Her smoking use included Cigarettes. She has a 7.5 pack-year smoking history. She has never used smokeless tobacco. She reports that she drinks about 18.0 oz of alcohol per week. She reports that she uses illicit drugs ("Crack" cocaine).   Family History:  The patient's family history includes Hypertension in her father and mother.    ROS:  Please see the history of present illness.   Otherwise, review of systems are positive for none.   All other systems are reviewed and negative.    PHYSICAL EXAM: VS:  BP 116/76 mmHg  Pulse 79  Ht 5\' 4"  (1.626 m)  Wt 181 lb (82.101 kg)  BMI 31.05 kg/m2 , BMI Body mass index is 31.05 kg/(m^2). GEN: Well nourished, well developed, in no acute distress HEENT: normal Neck: no JVD, carotid bruits, or masses Cardiac: RRR; no murmurs, rubs, or gallops, trace edema  Respiratory:  clear to auscultation bilaterally, normal work of breathing GI: soft,  nontender, nondistended, + BS MS: no deformity or atrophy Skin: warm and dry, no rash Neuro:  Strength and sensation are intact Psych: euthymic mood, full affect   EKG:  EKG was ordered today and showed NSR with nonspecific T wave abnormality and prolonged QTc at 460msec.    Recent Labs: 06/22/2014: Pro B Natriuretic peptide (BNP) 28725.0* 07/06/2014: ALT 20 08/09/2014: B Natriuretic Peptide 490.4* 08/25/2014: Magnesium 1.4* 08/27/2014:  BUN 42*; Creatinine 4.20*; Hemoglobin 8.8*; Platelets 155; Potassium 4.0; Sodium 133*    Lipid Panel    Component Value Date/Time   CHOL  02/12/2008 2008    136        ATP III CLASSIFICATION:  <200     mg/dL   Desirable  200-239  mg/dL   Borderline High  >=240    mg/dL   High   TRIG 54 02/12/2008 2008   HDL 76 02/12/2008 2008   CHOLHDL 1.8 02/12/2008 2008   VLDL 11 02/12/2008 2008   [place]  02/12/2008 2008    49        Total Cholesterol/HDL:CHD Risk Coronary Heart Disease Risk Table                     Men   Women  1/2 Average Risk   3.4   3.3      Wt Readings from Last 3 Encounters:  09/20/14 181 lb (82.101 kg)  08/27/14 189 lb 6.4 oz (85.911 kg)  08/17/14 180 lb 1.6 oz (81.693 kg)        ASSESSMENT AND PLAN:  1.  Chest pain - atypical and most likely related to poorly controlled HTN.  She has not had any further episodes since hospitalization last month.  Nuclear stress test showed no ischemia.  I think her CP was from poorly controlled HTN and since BP under control she has not had any further CP.  She is stable from a cardiac standpoint and is low risk to undergo surgery.  Will need to be cautious with fluids during surgery given her history off CHF and continue BP meds.   2.  Arm pain needing preoperative clearance 3.  Chronic combined systolic/diastolic CHF - appear euvolemic on exam.  Continue lasix and BB.  No ACE or ARB due to CKD.   4.  HTN - well controlled.  Continue Coreg/Clonidine   Current medicines are reviewed at  length with the patient today.  The patient does not have concerns regarding medicines.  The following changes have been made:  no change  Labs/ tests ordered today include: None  No orders of the defined types were placed in this encounter.     Disposition:   FU with me  in 6 months   Signed, Sueanne Margarita, MD  09/20/2014 3:16 PM    [facility] [place], [place], [place]  [address] Phone: [phone]; Fax: [phone]

## 2014-09-20 ENCOUNTER — Ambulatory Visit (INDEPENDENT_AMBULATORY_CARE_PROVIDER_SITE_OTHER): Payer: Medicare Other | Admitting: Cardiology

## 2014-09-20 ENCOUNTER — Encounter: Payer: Self-pay | Admitting: Cardiology

## 2014-09-20 VITALS — BP 116/76 | HR 79 | Ht 64.0 in | Wt 181.0 lb

## 2014-09-20 DIAGNOSIS — R079 Chest pain, unspecified: Secondary | ICD-10-CM

## 2014-09-20 DIAGNOSIS — I5042 Chronic combined systolic (congestive) and diastolic (congestive) heart failure: Secondary | ICD-10-CM

## 2014-09-20 DIAGNOSIS — Z01810 Encounter for preprocedural cardiovascular examination: Secondary | ICD-10-CM

## 2014-09-20 DIAGNOSIS — I10 Essential (primary) hypertension: Secondary | ICD-10-CM

## 2014-09-20 DIAGNOSIS — I429 Cardiomyopathy, unspecified: Secondary | ICD-10-CM

## 2014-09-20 DIAGNOSIS — N184 Chronic kidney disease, stage 4 (severe): Secondary | ICD-10-CM

## 2014-09-20 DIAGNOSIS — E1122 Type 2 diabetes mellitus with diabetic chronic kidney disease: Secondary | ICD-10-CM

## 2014-09-20 NOTE — Patient Instructions (Signed)
Your physician recommends that you continue on your current medications as directed. Please refer to the Current Medication list given to you today.  Your physician wants you to follow-up in: 6 months with Dr Turner You will receive a reminder letter in the mail two months in advance. If you don't receive a letter, please call our office to schedule the follow-up appointment.  

## 2014-09-21 ENCOUNTER — Telehealth: Payer: Self-pay | Admitting: Cardiology

## 2014-09-21 NOTE — Telephone Encounter (Signed)
Left message for [name] that surgical clearance has been placed in Medical Records "to be faxed" bin and should arrive at their office today.

## 2014-09-21 NOTE — Telephone Encounter (Signed)
New message     Request for surgical clearance:  What type of surgery is being performed? Arm surgery 1. When is this surgery scheduled? 10-04-14  Are there any medications that need to be held prior to surgery and how long? No----need medical clearance letter 2. Name of physician performing surgery? Dr Marchia Bond  3. What is your office phone and fax number?fax [phone]

## 2014-09-22 ENCOUNTER — Other Ambulatory Visit: Payer: Self-pay | Admitting: Orthopedic Surgery

## 2014-09-26 IMAGING — CR DG FOOT COMPLETE 3+V*L*
3 series · 3 of 3 positions shown · non-contrast
Comparison: 06/03/2013, 06/02/2013

CLINICAL DATA: Recent injury with Re opening of surgical wound

EXAM:
LEFT FOOT - COMPLETE 3+ VIEW

[x foot ap left]
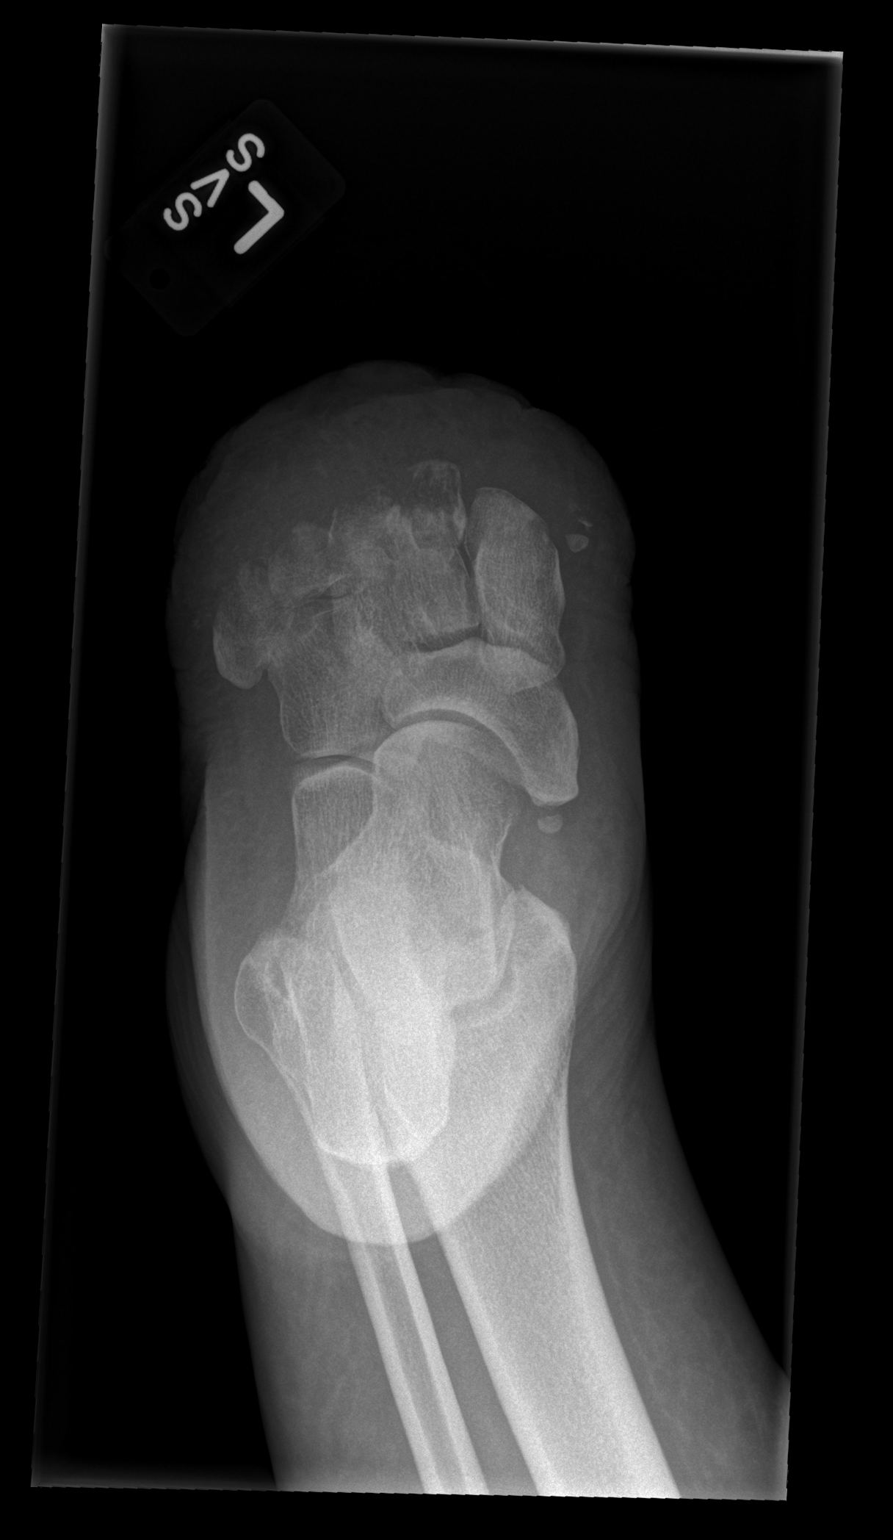

[x foot lat left (1 of 2)]
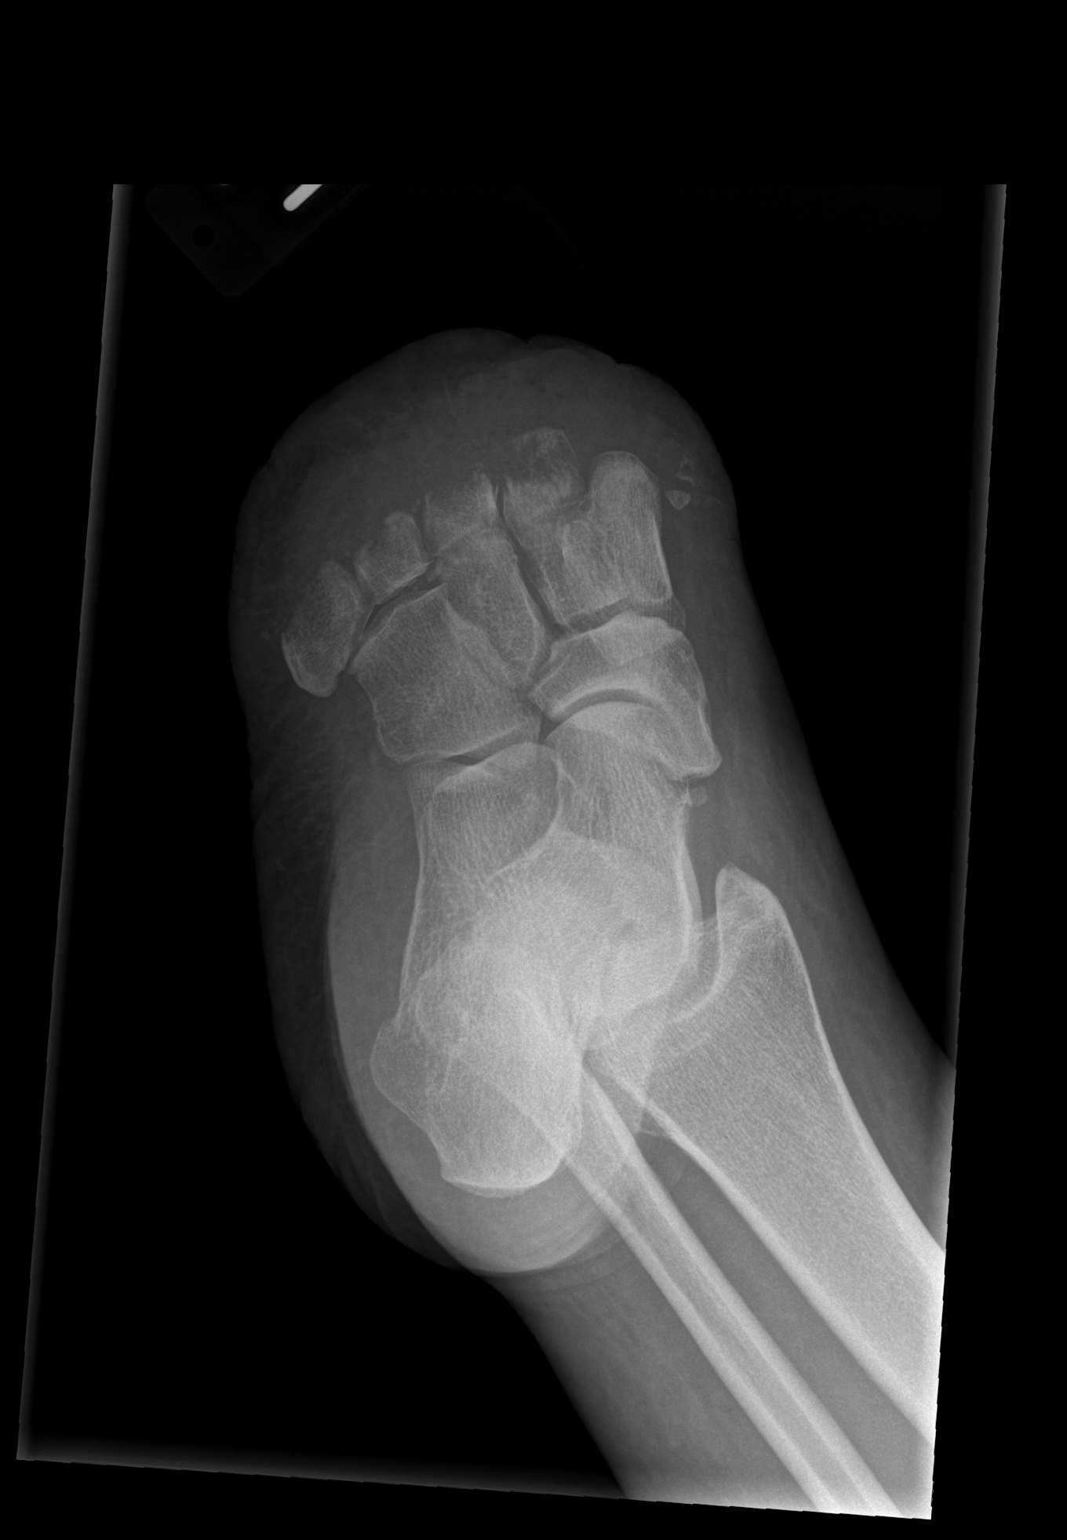

[x foot lat left (2 of 2)]
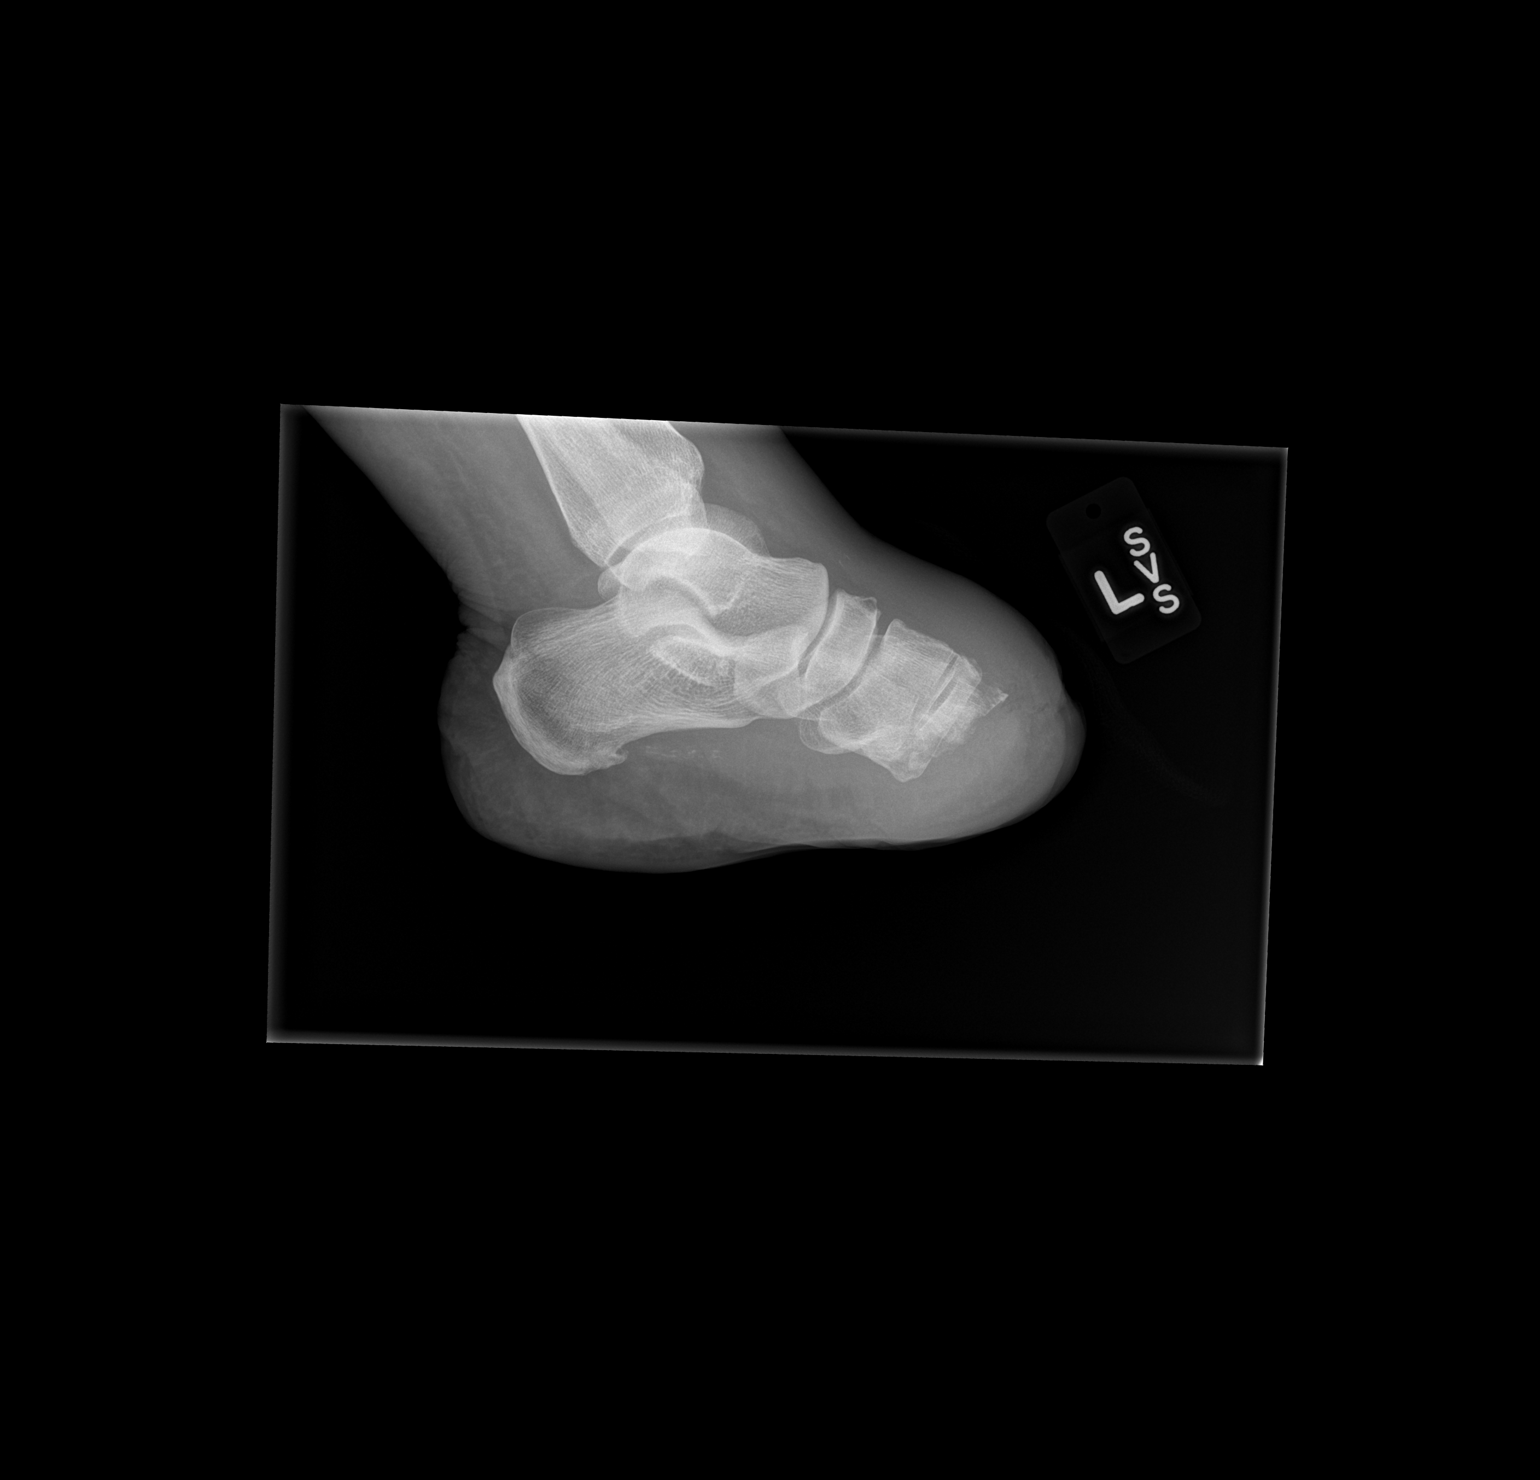

[3 of 3 positions shown; findings below may reference images not displayed]

FINDINGS: Soft tissue swelling is noted at the surgical wound. There is
significant destruction of what is left of the metatarsals when
compare with the prior exams. These changes are consistent with
osteomyelitis. Clinical correlation is recommended. No acute
fracture is noted.
IMPRESSION: Increasing destruction of the residual metatarsal bones suggestive
of osteomyelitis.

## 2014-09-26 NOTE — Discharge Instructions (Signed)
Darbepoetin Alfa injection What is this medicine? DARBEPOETIN ALFA (dar be POE e tin AL fa) helps your body make more red blood cells. It is used to treat anemia caused by chronic kidney failure and chemotherapy. This medicine may be used for other purposes; ask your health care provider or pharmacist if you have questions. COMMON BRAND NAME(S): Aranesp What should I tell my health care provider before I take this medicine? They need to know if you have any of these conditions: -blood clotting disorders or history of blood clots -cancer patient not on chemotherapy -cystic fibrosis -heart disease, such as angina, heart failure, or a history of a heart attack -hemoglobin level of 12 g/dL or greater -high blood pressure -low levels of folate, iron, or vitamin B12 -seizures -an unusual or allergic reaction to darbepoetin, erythropoietin, albumin, hamster proteins, latex, other medicines, foods, dyes, or preservatives -pregnant or trying to get pregnant -breast-feeding How should I use this medicine? This medicine is for injection into a vein or under the skin. It is usually given by a health care professional in a hospital or clinic setting. If you get this medicine at home, you will be taught how to prepare and give this medicine. Do not shake the solution before you withdraw a dose. Use exactly as directed. Take your medicine at regular intervals. Do not take your medicine more often than directed. It is important that you put your used needles and syringes in a special sharps container. Do not put them in a trash can. If you do not have a sharps container, call your pharmacist or healthcare provider to get one. Talk to your pediatrician regarding the use of this medicine in children. While this medicine may be used in children as young as 1 year for selected conditions, precautions do apply. Overdosage: If you think you have taken too much of this medicine contact a poison control center or  emergency room at once. NOTE: This medicine is only for you. Do not share this medicine with others. What if I miss a dose? If you miss a dose, take it as soon as you can. If it is almost time for your next dose, take only that dose. Do not take double or extra doses. What may interact with this medicine? Do not take this medicine with any of the following medications: -epoetin alfa This list may not describe all possible interactions. Give your health care provider a list of all the medicines, herbs, non-prescription drugs, or dietary supplements you use. Also tell them if you smoke, drink alcohol, or use illegal drugs. Some items may interact with your medicine. What should I watch for while using this medicine? Visit your prescriber or health care professional for regular checks on your progress and for the needed blood tests and blood pressure measurements. It is especially important for the doctor to make sure your hemoglobin level is in the desired range, to limit the risk of potential side effects and to give you the best benefit. Keep all appointments for any recommended tests. Check your blood pressure as directed. Ask your doctor what your blood pressure should be and when you should contact him or her. As your body makes more red blood cells, you may need to take iron, folic acid, or vitamin B supplements. Ask your doctor or health care provider which products are right for you. If you have kidney disease continue dietary restrictions, even though this medication can make you feel better. Talk with your doctor or health   care professional about the foods you eat and the vitamins that you take. What side effects may I notice from receiving this medicine? Side effects that you should report to your doctor or health care professional as soon as possible: -allergic reactions like skin rash, itching or hives, swelling of the face, lips, or tongue -breathing problems -changes in vision -chest  pain -confusion, trouble speaking or understanding -feeling faint or lightheaded, falls -high blood pressure -muscle aches or pains -pain, swelling, warmth in the leg -rapid weight gain -severe headaches -sudden numbness or weakness of the face, arm or leg -trouble walking, dizziness, loss of balance or coordination -seizures (convulsions) -swelling of the ankles, feet, hands -unusually weak or tired Side effects that usually do not require medical attention (report to your doctor or health care professional if they continue or are bothersome): -diarrhea -fever, chills (flu-like symptoms) -headaches -nausea, vomiting -redness, stinging, or swelling at site where injected This list may not describe all possible side effects. Call your doctor for medical advice about side effects. You may report side effects to FDA at 1-800-FDA-1088. Where should I keep my medicine? Keep out of the reach of children. Store in a refrigerator between 2 and 8 degrees C (36 and 46 degrees F). Do not freeze. Do not shake. Throw away any unused portion if using a single-dose vial. Throw away any unused medicine after the expiration date. NOTE: This sheet is a summary. It may not cover all possible information. If you have questions about this medicine, talk to your doctor, pharmacist, or health care provider.  2015, Elsevier/Gold Standard. (2008-07-05 10:23:57)  

## 2014-09-27 ENCOUNTER — Inpatient Hospital Stay (HOSPITAL_COMMUNITY)
Admission: RE | Admit: 2014-09-27 | Discharge: 2014-09-27 | Disposition: A | Discharge status: Home / Self Care | Payer: Self-pay | Source: Ambulatory Visit

## 2014-09-27 ENCOUNTER — Other Ambulatory Visit (HOSPITAL_COMMUNITY): Payer: Self-pay | Admitting: *Deleted

## 2014-09-27 ENCOUNTER — Inpatient Hospital Stay (HOSPITAL_COMMUNITY)
Admission: RE | Admit: 2014-09-27 | Discharge: 2014-09-27 | Disposition: A | Discharge status: Home / Self Care | Payer: Self-pay | Source: Ambulatory Visit | Attending: Nephrology | Admitting: Nephrology

## 2014-09-30 ENCOUNTER — Emergency Department (HOSPITAL_COMMUNITY)
Admission: EM | Admit: 2014-09-30 | Discharge: 2014-09-30 | Disposition: A | Discharge status: Home / Self Care | Payer: Medicare Other | Attending: Emergency Medicine | Admitting: Emergency Medicine

## 2014-09-30 ENCOUNTER — Emergency Department (HOSPITAL_COMMUNITY): Payer: Medicare Other

## 2014-09-30 ENCOUNTER — Encounter (HOSPITAL_COMMUNITY)
Admission: RE | Admit: 2014-09-30 | Discharge: 2014-09-30 | Disposition: A | Discharge status: Home / Self Care | Payer: Medicare Other | Source: Ambulatory Visit | Attending: Nephrology | Admitting: Nephrology

## 2014-09-30 ENCOUNTER — Other Ambulatory Visit: Payer: Self-pay

## 2014-09-30 ENCOUNTER — Encounter (HOSPITAL_COMMUNITY): Payer: Self-pay | Admitting: *Deleted

## 2014-09-30 ENCOUNTER — Encounter (HOSPITAL_COMMUNITY): Payer: Self-pay | Admitting: Vascular Surgery

## 2014-09-30 ENCOUNTER — Encounter (HOSPITAL_COMMUNITY)
Admission: RE | Admit: 2014-09-30 | Discharge: 2014-09-30 | Disposition: A | Discharge status: Home / Self Care | Payer: Medicare Other | Source: Ambulatory Visit | Attending: Orthopedic Surgery | Admitting: Orthopedic Surgery

## 2014-09-30 ENCOUNTER — Encounter (HOSPITAL_COMMUNITY): Payer: Self-pay

## 2014-09-30 DIAGNOSIS — N183 Chronic kidney disease, stage 3 unspecified: Secondary | ICD-10-CM

## 2014-09-30 DIAGNOSIS — M21921 Unspecified acquired deformity of right upper arm: Secondary | ICD-10-CM | POA: Diagnosis not present

## 2014-09-30 DIAGNOSIS — S42309A Unspecified fracture of shaft of humerus, unspecified arm, initial encounter for closed fracture: Secondary | ICD-10-CM | POA: Insufficient documentation

## 2014-09-30 DIAGNOSIS — G40909 Epilepsy, unspecified, not intractable, without status epilepticus: Secondary | ICD-10-CM | POA: Insufficient documentation

## 2014-09-30 DIAGNOSIS — D631 Anemia in chronic kidney disease: Secondary | ICD-10-CM | POA: Insufficient documentation

## 2014-09-30 DIAGNOSIS — X58XXXA Exposure to other specified factors, initial encounter: Secondary | ICD-10-CM | POA: Insufficient documentation

## 2014-09-30 DIAGNOSIS — D509 Iron deficiency anemia, unspecified: Secondary | ICD-10-CM

## 2014-09-30 DIAGNOSIS — Z794 Long term (current) use of insulin: Secondary | ICD-10-CM | POA: Insufficient documentation

## 2014-09-30 DIAGNOSIS — E1140 Type 2 diabetes mellitus with diabetic neuropathy, unspecified: Secondary | ICD-10-CM | POA: Diagnosis not present

## 2014-09-30 DIAGNOSIS — R5383 Other fatigue: Secondary | ICD-10-CM | POA: Insufficient documentation

## 2014-09-30 DIAGNOSIS — Z01810 Encounter for preprocedural cardiovascular examination: Secondary | ICD-10-CM | POA: Diagnosis not present

## 2014-09-30 DIAGNOSIS — G894 Chronic pain syndrome: Secondary | ICD-10-CM | POA: Diagnosis not present

## 2014-09-30 DIAGNOSIS — F259 Schizoaffective disorder, unspecified: Secondary | ICD-10-CM | POA: Diagnosis not present

## 2014-09-30 DIAGNOSIS — Z5181 Encounter for therapeutic drug level monitoring: Secondary | ICD-10-CM | POA: Insufficient documentation

## 2014-09-30 DIAGNOSIS — D649 Anemia, unspecified: Secondary | ICD-10-CM | POA: Diagnosis not present

## 2014-09-30 DIAGNOSIS — I129 Hypertensive chronic kidney disease with stage 1 through stage 4 chronic kidney disease, or unspecified chronic kidney disease: Secondary | ICD-10-CM | POA: Insufficient documentation

## 2014-09-30 DIAGNOSIS — Z01812 Encounter for preprocedural laboratory examination: Secondary | ICD-10-CM | POA: Insufficient documentation

## 2014-09-30 DIAGNOSIS — Z79899 Other long term (current) drug therapy: Secondary | ICD-10-CM | POA: Insufficient documentation

## 2014-09-30 DIAGNOSIS — Z87891 Personal history of nicotine dependence: Secondary | ICD-10-CM | POA: Diagnosis not present

## 2014-09-30 DIAGNOSIS — Z7982 Long term (current) use of aspirin: Secondary | ICD-10-CM | POA: Insufficient documentation

## 2014-09-30 DIAGNOSIS — Z792 Long term (current) use of antibiotics: Secondary | ICD-10-CM | POA: Diagnosis not present

## 2014-09-30 DIAGNOSIS — Z880 Allergy status to penicillin: Secondary | ICD-10-CM | POA: Insufficient documentation

## 2014-09-30 DIAGNOSIS — Z8619 Personal history of other infectious and parasitic diseases: Secondary | ICD-10-CM | POA: Diagnosis not present

## 2014-09-30 DIAGNOSIS — N184 Chronic kidney disease, stage 4 (severe): Secondary | ICD-10-CM | POA: Insufficient documentation

## 2014-09-30 DIAGNOSIS — R0602 Shortness of breath: Secondary | ICD-10-CM | POA: Diagnosis present

## 2014-09-30 DIAGNOSIS — F319 Bipolar disorder, unspecified: Secondary | ICD-10-CM | POA: Insufficient documentation

## 2014-09-30 DIAGNOSIS — Z8673 Personal history of transient ischemic attack (TIA), and cerebral infarction without residual deficits: Secondary | ICD-10-CM | POA: Diagnosis not present

## 2014-09-30 DIAGNOSIS — I5042 Chronic combined systolic (congestive) and diastolic (congestive) heart failure: Secondary | ICD-10-CM | POA: Diagnosis not present

## 2014-09-30 HISTORY — DX: Anxiety disorder, unspecified: F41.9

## 2014-09-30 HISTORY — DX: Anemia, unspecified: D64.9

## 2014-09-30 HISTORY — DX: Headache, unspecified: R51.9

## 2014-09-30 HISTORY — DX: Personal history of other diseases of the digestive system: Z87.19

## 2014-09-30 HISTORY — DX: Headache: R51

## 2014-09-30 HISTORY — DX: Depression, unspecified: F32.A

## 2014-09-30 HISTORY — DX: Major depressive disorder, single episode, unspecified: F32.9

## 2014-09-30 HISTORY — DX: Gastro-esophageal reflux disease without esophagitis: K21.9

## 2014-09-30 LAB — COMPREHENSIVE METABOLIC PANEL
ALT: 56 U/L — ABNORMAL HIGH (ref 0–35)
ANION GAP: 4 — AB (ref 5–15)
AST: 59 U/L — AB (ref 0–37)
Albumin: 2.1 g/dL — ABNORMAL LOW (ref 3.5–5.2)
Alkaline Phosphatase: 101 U/L (ref 39–117)
BUN: 50 mg/dL — AB (ref 6–23)
CO2: 22 mmol/L (ref 19–32)
Calcium: 7.9 mg/dL — ABNORMAL LOW (ref 8.4–10.5)
Chloride: 106 mmol/L (ref 96–112)
Creatinine, Ser: 4.73 mg/dL — ABNORMAL HIGH (ref 0.50–1.10)
GFR calc non Af Amer: 10 mL/min — ABNORMAL LOW (ref >90)
GFR, EST AFRICAN AMERICAN: 12 mL/min — AB (ref >90)
Glucose, Bld: 282 mg/dL — ABNORMAL HIGH (ref 70–99)
Potassium: 3.7 mmol/L (ref 3.5–5.1)
Sodium: 132 mmol/L — ABNORMAL LOW (ref 135–145)
TOTAL PROTEIN: 5.6 g/dL — AB (ref 6.0–8.3)
Total Bilirubin: 0.2 mg/dL — ABNORMAL LOW (ref 0.3–1.2)

## 2014-09-30 LAB — RETICULOCYTES
RBC.: 2.26 MIL/uL — ABNORMAL LOW (ref 3.87–5.11)
Retic Count, Absolute: 192.1 10*3/uL — ABNORMAL HIGH (ref 19.0–186.0)
Retic Ct Pct: 8.5 % — ABNORMAL HIGH (ref 0.4–3.1)

## 2014-09-30 LAB — CBC WITH DIFFERENTIAL/PLATELET
BASOS ABS: 0 10*3/uL (ref 0.0–0.1)
BASOS PCT: 0 % (ref 0–1)
EOS ABS: 0.1 10*3/uL (ref 0.0–0.7)
Eosinophils Relative: 4 % (ref 0–5)
HCT: 20.9 % — ABNORMAL LOW (ref 36.0–46.0)
Hemoglobin: 6.9 g/dL — CL (ref 12.0–15.0)
LYMPHS ABS: 0.9 10*3/uL (ref 0.7–4.0)
Lymphocytes Relative: 36 % (ref 12–46)
MCH: 30.5 pg (ref 26.0–34.0)
MCHC: 33 g/dL (ref 30.0–36.0)
MCV: 92.5 fL (ref 78.0–100.0)
Monocytes Absolute: 0.2 10*3/uL (ref 0.1–1.0)
Monocytes Relative: 9 % (ref 3–12)
NEUTROS PCT: 51 % (ref 43–77)
Neutro Abs: 1.3 10*3/uL — ABNORMAL LOW (ref 1.7–7.7)
PLATELETS: 152 10*3/uL (ref 150–400)
RBC: 2.26 MIL/uL — AB (ref 3.87–5.11)
RDW: 17.1 % — AB (ref 11.5–15.5)
WBC: 2.6 10*3/uL — AB (ref 4.0–10.5)

## 2014-09-30 LAB — I-STAT TROPONIN, ED: Troponin i, poc: 0.01 ng/mL (ref 0.00–0.08)

## 2014-09-30 LAB — BRAIN NATRIURETIC PEPTIDE: B Natriuretic Peptide: 61.6 pg/mL (ref 0.0–100.0)

## 2014-09-30 LAB — POCT HEMOGLOBIN-HEMACUE: Hemoglobin: 5.6 g/dL — CL (ref 12.0–15.0)

## 2014-09-30 MED ORDER — DARBEPOETIN ALFA 100 MCG/0.5ML IJ SOSY
100.0000 ug | PREFILLED_SYRINGE | INTRAMUSCULAR | Status: DC
  Administered 2014-09-30: 100 ug via SUBCUTANEOUS

## 2014-09-30 NOTE — Pre-Procedure Instructions (Signed)
[name]  09/30/2014   Your procedure is scheduled on:  Tuesday, October 04, 2014 at 10:35 AM.   Report to [facility] Entrance "A" Admitting Office at 8:30 AM.   Call this number if you have problems the morning of surgery: [phone]               Any questions prior to day of surgery, please call [phone] between 8 & 4 PM.   Remember:   Do not eat food or drink liquids after midnight Monday, 10/03/14.   Take these medicines the morning of surgery with A SIP OF WATER: Carvedilol (Coreg), Clonidine (Catapres), Levetiracetam (Keppra), Pregabalin (Lyrica), Olanzapine (Zyprexa), Oxycodone (Percocet) - if needed  Stop Aspirin and Vitamins as of today.  Do NOT take diabetic medications the morning of surgery.   Do not wear jewelry, make-up or nail polish.  Do not wear lotions, powders, or perfumes. You may wear deodorant.  Do not shave 48 hours prior to surgery.   Do not bring valuables to the hospital.  [facility] is not responsible                  for any belongings or valuables.               Contacts, dentures or bridgework may not be worn into surgery.  Leave suitcase in the car. After surgery it may be brought to your room.  For patients admitted to the hospital, discharge time is determined by your                treatment team.              Special Instructions: [facility] - Preparing for Surgery  Before surgery, you can play an important role.  Because skin is not sterile, your skin needs to be as free of germs as possible.  You can reduce the number of germs on you skin by washing with CHG (chlorahexidine gluconate) soap before surgery.  CHG is an antiseptic cleaner which kills germs and bonds with the skin to continue killing germs even after washing.  Please DO NOT use if you have an allergy to CHG or antibacterial soaps.  If your skin becomes reddened/irritated stop using the CHG and inform your nurse when you arrive at Short Stay.  Do not shave (including  legs and underarms) for at least 48 hours prior to the first CHG shower.  You may shave your face.  Please follow these instructions carefully:   1.  Shower with CHG Soap the night before surgery and the                                morning of Surgery.  2.  If you choose to wash your hair, wash your hair first as usual with your       normal shampoo.  3.  After you shampoo, rinse your hair and body thoroughly to remove the                      Shampoo.  4.  Use CHG as you would any other liquid soap.  You can apply chg directly       to the skin and wash gently with scrungie or a clean washcloth.  5.  Apply the CHG Soap to your body ONLY FROM THE NECK DOWN.  Do not use on open wounds or open sores.  Avoid contact with your eyes, ears, mouth and genitals (private parts).  Wash genitals (private parts) with your normal soap.  6.  Wash thoroughly, paying special attention to the area where your surgery        will be performed.  7.  Thoroughly rinse your body with warm water from the neck down.  8.  DO NOT shower/wash with your normal soap after using and rinsing off       the CHG Soap.  9.  Pat yourself dry with a clean towel.            10.  Wear clean pajamas.            11.  Place clean sheets on your bed the night of your first shower and do not        sleep with pets.  Day of Surgery  Do not apply any lotions the morning of surgery.  Please wear clean clothes to the hospital.     Please read over the following fact sheets that you were given: Pain Booklet, Coughing and Deep Breathing and Surgical Site Infection Prevention

## 2014-09-30 NOTE — Progress Notes (Signed)
Patient came to PAT appointment at 1530 PM after leaving ER. She stated she  just left after waiting to be D/C.. Labs, EKG, and CXR was done in ER  Labs abnormal hgb 6.9, labs not repeated at PAT.

## 2014-09-30 NOTE — Progress Notes (Signed)
Anesthesia Note: Patient is a 49 year old female scheduled for ulnar nerve transposition, repair of a humerus fracture with iliac autograft with osteotomy ulnar on 10/04/14 by Dr. Thea Alken.  Procedure was initially scheduled for 08/26/14 but was cancelled due to need for further medical, cardiology, and nephrology evaluations.  Her PAT appointment was scheduled for this afternoon but was canceled because she was sent to the ED for a Hemacue of 5.6 when she presented for an Aranesp injection at [facility].  OF NOTE, HER CURRENT MRN IS [id number], BUT IS FLAGGED FOR MERGER WITH MRN [id number]. PLEASE REFER TO THE LATER MRN FOR PREVIOUS ED, CARDIOLOGY, ETC NOTES.   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 She was admitted to [facility] on 07/05/14 for RLE cellulitis and acute on chronic CKD stage IV. Most recent hospitalization was on 08/24/14 for accelerated hypertension and chest pain. She had a stress and echo (see below). She has since been cleared with low CV risk for surgery by cardiologist Dr. Fransico Him. She felt the patient's chest pain was likely from poorly controlled HTN.  She recommended to be cautious with fluids during surgery given her CHF history and to continue her BP medications.  PCP is Dr. Maurice Small with [place]. Her last visit was on 09/19/14.  At that time, Dr. Justin Mend felt patient was "moderate risk for elective surgery given her comorbidities. I don't know how much better we can expect her diabetic control to get if she will not increase her insulin. BP improving-will continue to adjust this over the next few weeks. I don't think further testing or delay will change her risk  status but again I will keep working with her on diabetic and blood pressure control. Cleared from my perspective. Please call with any questions [phone]." Nephrology records from Dr. Mercy Moore are pending.   Meds include ASA, clonidine, Coreg, Aranesp, 65 Fe, Folvite, Lasix, Novolog, Lantus, Keppra, Zyrexa, Lyrica, Ambien.   09/30/14 EKG: SR, LVH.  08/26/14 Nuclear stress test:  1. No reversible ischemia or infarction. 2. Nonspecific mild septal hypokinesis. 3. Left ventricular ejection fraction 48% 4. Intermediate-risk stress test findings*.  08/26/14 Echo:  - Left ventricle: The cavity size was moderately dilated. Wall thickness was increased in a pattern of moderate LVH. The estimated ejection fraction was 40%. Diffuse hypokinesis. - Mitral valve: There was mild regurgitation. - Left atrium: The atrium was mildly dilated. - Atrial septum: No defect or patent foramen ovale was identified. - Impressions: Diastolic evaluation and mitral inflow doppler not complete.  02/09/13 Carotid duplex: - The vertebral arteries appear patent with antegrade flow. - Findings consistent with less than 39 percent stenosis involving the right internal carotid artery and the left internal carotid artery. - ICA/CCA ratio. Right = 1.08. left = 0.74.  08/24/14 1V CXR: No active disease.  Surgery is currently scheduled for 10/04/14. Unfortunately, she is now in the ED with significant anemia.  I have left a voice message with Sherri at Dr. Luanna Cole office. I will plan to follow-up Monday for additional input.  [facility] Short Stay Center/Anesthesiology Phone [phone] 09/30/2014 1:44 PM

## 2014-09-30 NOTE — Progress Notes (Signed)
Pt seen for initial Aranesp injection.  Obtained hemocue which showed a hemoglobin of 5.6.  Dr Mattingly's office notified and instructed to send pt to ER.  Pt transferred to ER without difficulty.

## 2014-09-30 NOTE — Pre-Procedure Instructions (Signed)
[name]  09/30/2014   Your procedure is scheduled on:  Tuesday, October 04, 2014 at 10:35 AM.   Report to [facility] Entrance "A" Admitting Office at 8:30 AM.   Call this number if you have problems the morning of surgery: [phone]               Any questions prior to day of surgery, please call [phone] between 8 & 4 PM.   Remember:   Do not eat food or drink liquids after midnight Monday, 10/03/14.   Take these medicines the morning of surgery with A SIP OF WATER: Carvedilol (Coreg), Clonidine (Catapres), Levetiracetam (Keppra), Pregabalin (Lyrica), Olanzapine (Zyprexa), Oxycodone (Percocet) - if needed  Stop Aspirin and Vitamins as of today.  Do NOT take diabetic medications the morning of surgery.   Do not wear jewelry, make-up or nail polish.  Do not wear lotions, powders, or perfumes. You may wear deodorant.  Do not shave 48 hours prior to surgery.   Do not bring valuables to the hospital.  [facility] is not responsible                  for any belongings or valuables.               Contacts, dentures or bridgework may not be worn into surgery.  Leave suitcase in the car. After surgery it may be brought to your room.  For patients admitted to the hospital, discharge time is determined by your                treatment team.              Special Instructions: [facility] - Preparing for Surgery  Before surgery, you can play an important role.  Because skin is not sterile, your skin needs to be as free of germs as possible.  You can reduce the number of germs on you skin by washing with CHG (chlorahexidine gluconate) soap before surgery.  CHG is an antiseptic cleaner which kills germs and bonds with the skin to continue killing germs even after washing.  Please DO NOT use if you have an allergy to CHG or antibacterial soaps.  If your skin becomes reddened/irritated stop using the CHG and inform your nurse when you arrive at Short Stay.  Do not shave (including  legs and underarms) for at least 48 hours prior to the first CHG shower.  You may shave your face.  Please follow these instructions carefully:   1.  Shower with CHG Soap the night before surgery and the                                morning of Surgery.  2.  If you choose to wash your hair, wash your hair first as usual with your       normal shampoo.  3.  After you shampoo, rinse your hair and body thoroughly to remove the                      Shampoo.  4.  Use CHG as you would any other liquid soap.  You can apply chg directly       to the skin and wash gently with scrungie or a clean washcloth.  5.  Apply the CHG Soap to your body ONLY FROM THE NECK DOWN.  Do not use on open wounds or open sores.  Avoid contact with your eyes, ears, mouth and genitals (private parts).  Wash genitals (private parts) with your normal soap.  6.  Wash thoroughly, paying special attention to the area where your surgery        will be performed.  7.  Thoroughly rinse your body with warm water from the neck down.  8.  DO NOT shower/wash with your normal soap after using and rinsing off       the CHG Soap.  9.  Pat yourself dry with a clean towel.            10.  Wear clean pajamas.            11.  Place clean sheets on your bed the night of your first shower and do not        sleep with pets.  Day of Surgery  Do not apply any lotions the morning of surgery.  Please wear clean clothes to the hospital.     Please read over the following fact sheets that you were given: Pain Booklet, Coughing and Deep Breathing and Surgical Site Infection Prevention

## 2014-09-30 NOTE — ED Provider Notes (Signed)
CSN: [id number]     Arrival date & time 09/30/14  1239 History   First MD Initiated Contact with Patient 09/30/14 1255     Chief Complaint  Patient presents with  . Anemia     (Consider location/radiation/quality/duration/timing/severity/associated sxs/prior Treatment) The history is provided by the patient and medical records.    This is a 49 year old female with past medical history significant for bipolar disorder, hypertension, hyperlipidemia, diabetes, hep C, chronic kidney disease, seizure disorder, congestive heart failure, chronic anemia, presenting to the ED for low hemoglobin. Patient states her nephrologist, Dr. Mercy Moore, recently start her on aranesp shots to help with her anemia. States she received her first shot today, she had blood work done which showed a hemoglobin of 5.0. He does not know what her baseline hemoglobin usually is. Patient also admits she has not been taking her iron supplements or B12 supplements for the past 2 weeks. She notes increased fatigue and some occasional shortness of breath. She denies any chest pain. No blood in stools.  Patient will not accept blood transfusions or blood products of any kind at this time as she is a [employer] witness.  VSS on arrival.  Past Medical History  Diagnosis Date  . Neuropathy   . Glaucoma   . Bipolar 1 disorder   . Refusal of blood transfusions as patient is Jehov[employer]'s Witness   . Hypertension   . Shortness of breath   . TIA (transient ischemic attack) 2010  . Neuromuscular disorder     DIABETIC NEUROPATHY  . MRSA infection ?2014    "on the back of my head; spread throughout my bloodstream"  . Bipolar disorder, unspecified 02/06/2014  . High cholesterol   . Type II diabetes mellitus     INSULIN DEPENDENT  . Hepatitis C   . Chronic pain syndrome   . Chronic kidney disease (CKD), stage III (moderate)   . Schizoaffective disorder, unspecified condition 02/08/2014  . Seizure dx'd 2014    "don't know what kind; last  one was ~ 04/2014"  . Chronic combined systolic and diastolic CHF (congestive heart failure)     EF 40% by echo and 48% by stress test  . Anemia    Past Surgical History  Procedure Laterality Date  . Amputation Left 05/21/2013    Procedure: Left Midfoot AMPUTATION;  Surgeon: Newt Minion, MD;  Location: [facility];  Service: Orthopedics;  Laterality: Left;  Left Midfoot Amputation  . Tubal ligation    . Colonoscopy with propofol N/A 06/22/2013    Procedure: COLONOSCOPY WITH PROPOFOL;  Surgeon: Jeryl Columbia, MD;  Location: [facility];  Service: Endoscopy;  Laterality: N/A;  . Abdominal hysterectomy  2014  . Tonsillectomy  1970's  . Refractive surgery Bilateral 2013   Family History  Problem Relation Age of Onset  . Hypertension Mother   . Hypertension Father    History  Substance Use Topics  . Smoking status: Former Smoker -- 0.25 packs/day for 30 years    Types: Cigarettes    Quit date: 06/17/2014  . Smokeless tobacco: Never Used     Comment: "stopped smoking in ~ 2013; restarted last week; stopped again 06/17/2014"  . Alcohol Use: 18.0 oz/week    30 Cans of beer per week     Comment: 06/22/2014 "2-3, 40oz beers; 3 days/wk"   OB History    No data available     Review of Systems  Constitutional: Positive for fatigue.  Respiratory: Positive for shortness of breath.  All other systems reviewed and are negative.     Allergies  Gabapentin; Saphris; Tramadol; and Penicillins  Home Medications   Prior to Admission medications   Medication Sig Start Date End Date Taking? Authorizing Provider  aspirin EC 325 MG EC tablet Take 1 tablet (325 mg total) by mouth daily. 08/27/14   Geradine Girt, DO  carvedilol (COREG) 25 MG tablet Take 1 tablet (25 mg total) by mouth 2 (two) times daily. 06/24/14   Debbe Odea, MD  cephALEXin (KEFLEX) 500 MG capsule Take 1 capsule (500 mg total) by mouth every 12 (twelve) hours. 08/27/14   Geradine Girt, DO  cloNIDine (CATAPRES) 0.1 MG tablet   06/25/14   Historical Provider, MD  ferrous sulfate 325 (65 FE) MG tablet Take 1 tablet (325 mg total) by mouth daily with breakfast. 06/24/14   Debbe Odea, MD  folic acid (FOLVITE) A999333 MCG tablet Take 400 mcg by mouth every morning.    Historical Provider, MD  furosemide (LASIX) 40 MG tablet Take 2 tablets (80 mg total) by mouth 2 (two) times daily. 08/09/14   Larene Pickett, PA-C  insulin aspart (NOVOLOG) 100 UNIT/ML injection Inject 2-10 Units into the skin 3 (three) times daily before meals. SSI: 0-200 = 2 units, 201-250 = 4 units, 251-300 = 6 units, 301-350 = 8 units, 351-400 = 10 units    Historical Provider, MD  insulin glargine (LANTUS) 100 UNIT/ML injection Inject 0.3 mLs (30 Units total) into the skin daily. 08/27/14   Geradine Girt, DO  levETIRAcetam (KEPPRA) 500 MG tablet Take 1 tablet (500 mg total) by mouth 2 (two) times daily. Patient taking differently: Take 1,000 mg by mouth at bedtime.  06/24/14   Debbe Odea, MD  Multiple Vitamins-Minerals (SENIOR MULTIVITAMIN PLUS PO) Take 1 tablet by mouth every morning.    Historical Provider, MD  mupirocin cream (BACTROBAN) 2 % Apply topically daily. 08/27/14   Geradine Girt, DO  OLANZapine (ZYPREXA) 5 MG tablet  07/22/14   Historical Provider, MD  oxyCODONE-acetaminophen (PERCOCET/ROXICET) 5-325 MG per tablet Take 1 tablet by mouth every 6 (six) hours as needed for moderate pain. 08/27/14   Geradine Girt, DO  polyethylene glycol (MIRALAX / GLYCOLAX) packet Take 17 g by mouth 2 (two) times daily.    Historical Provider, MD  pregabalin (LYRICA) 75 MG capsule Take 75 mg by mouth 2 (two) times daily.    Historical Provider, MD  zolpidem (AMBIEN) 5 MG tablet Take 5 mg by mouth at bedtime as needed for sleep.    Historical Provider, MD   BP 174/88 mmHg  Pulse 97  Temp(Src) 97.7 F (36.5 C) (Oral)  Resp 18  Ht 5\' 4"  (1.626 m)  Wt 188 lb (85.276 kg)  BMI 32.25 kg/m2  SpO2 100%   Physical Exam  Constitutional: She is oriented to person,  place, and time. She appears well-developed and well-nourished. No distress.  HENT:  Head: Normocephalic and atraumatic.  Mouth/Throat: Oropharynx is clear and moist.  Eyes: Conjunctivae and EOM are normal. Pupils are equal, round, and reactive to light.  Conjunctiva pale  Neck: Normal range of motion. Neck supple.  Cardiovascular: Normal rate, regular rhythm and normal heart sounds.   Pulmonary/Chest: Effort normal and breath sounds normal. No respiratory distress. She has no wheezes.  Abdominal: Soft. Bowel sounds are normal. There is no tenderness. There is no guarding and no CVA tenderness.  Abdomen soft, non-distended, no focal tenderness or peritoneal signs  Musculoskeletal: Normal range  of motion.  Chronic deformity of right elbow, unchanged from previous  Neurological: She is alert and oriented to person, place, and time.  Skin: Skin is warm and dry. She is not diaphoretic.  Psychiatric: She has a normal mood and affect.  Nursing note and vitals reviewed.   ED Course  Procedures (including critical care time) Labs Review Labs Reviewed  CBC WITH DIFFERENTIAL/PLATELET - Abnormal; Notable for the following:    WBC 2.6 (*)    RBC 2.26 (*)    Hemoglobin 6.9 (*)    HCT 20.9 (*)    RDW 17.1 (*)    Neutro Abs 1.3 (*)    All other components within normal limits  RETICULOCYTES - Abnormal; Notable for the following:    Retic Ct Pct 8.5 (*)    RBC. 2.26 (*)    Retic Count, Manual 192.1 (*)    All other components within normal limits  COMPREHENSIVE METABOLIC PANEL - Abnormal; Notable for the following:    Sodium 132 (*)    Glucose, Bld 282 (*)    BUN 50 (*)    Creatinine, Ser 4.73 (*)    Calcium 7.9 (*)    Total Protein 5.6 (*)    Albumin 2.1 (*)    AST 59 (*)    ALT 56 (*)    Total Bilirubin 0.2 (*)    GFR calc non Af Amer 10 (*)    GFR calc Af Amer 12 (*)    Anion gap 4 (*)    All other components within normal limits  BRAIN NATRIURETIC PEPTIDE  VITAMIN B12  FOLATE   IRON AND TIBC  FERRITIN  I-STAT TROPOININ, ED    Imaging Review Dg Chest 2 View  09/30/2014   CLINICAL DATA:  Anemia.  Decreased hemoglobin.  EXAM: CHEST  2 VIEW  COMPARISON:  None.  FINDINGS: Cardiopericardial silhouette within normal limits. Mediastinal contours normal. Trachea midline. No airspace disease or effusion.  IMPRESSION: No active cardiopulmonary disease.   Electronically Signed   By: Dereck Ligas M.D.   On: 09/30/2014 13:46     EKG Interpretation   Date/Time:  Friday September 30 2014 12:52:31 EST Ventricular Rate:  96 PR Interval:  187 QRS Duration: 85 QT Interval:  371 QTC Calculation: 469 R Axis:   66 Text Interpretation:  Sinus rhythm Consider left ventricular hypertrophy  No significant change since last tracing Confirmed by Christy Gentles  MD, DONALD  [phone]) on 09/30/2014 12:56:23 PM      MDM   Final diagnoses:  SOB (shortness of breath)  Iron deficiency anemia  Other fatigue  CKD (chronic kidney disease) stage 3, GFR 30-59 ml/min   49 year old female with chronic anemia, presenting here for low hemoglobin. Patient received her Aranesp injection earlier today and had labs drawn revealing hemoglobin of 5.  States she was sent here for further evaluation. Patient denies any GI bleeding.  She does note some fatigue and mild shortness of breath.  No chest pain. Vital signs stable on room air. Will obtain blood work, EKG, chest x-ray.  Labwork with hemoglobin of 6.9. Remainder of lab work appears baseline for patient when compared with previous. Her CKD is noted to be progressively worsening.  She refuses dialysis.  Anemia panel pending.  Troponin negative.  Chest x-ray clear.  Given that patient is [employer]'s Witness and not accept blood products and her only acute issue seems to be anemia, do not feel admission will be worthwhile.  Case discussed with nephrology, Dr. Florene Glen,  who agrees and states that if patient is stable without active bleeding, she should continue  her injections and supplements at home.  This was discussed with patient, she is in agreement with this plan of care and states she is ready to go home.  VS have remained stable in the ED, patient ambulatory without difficulty.  Patient will be d/c to FU with PCP and nephrology.  Discussed plan with patient, he/she acknowledged understanding and agreed with plan of care.  Return precautions given for new or worsening symptoms.  Case discussed with attending physician, Dr. Christy Gentles, who agrees with assessment and plan of care.  Larene Pickett, PA-C 09/30/14 [place], MD 10/01/14 [phone]

## 2014-09-30 NOTE — ED Notes (Signed)
Pt was given a "shot for anemia" this am.  However, they drew her blood at the same time and her hgb was 5.  Pt also c/o weakness causing difficulty completing daily activities.

## 2014-09-30 NOTE — Discharge Instructions (Signed)
Continue your aranesp injections.  Start taking your iron and b12 supplements to help with anemia. Follow-up with Dr. Mercy Moore. Return to the ED for new concerns.

## 2014-10-01 LAB — FOLATE: Folate: >20 ng/mL

## 2014-10-01 LAB — VITAMIN B12: Vitamin B-12: 1119 pg/mL — ABNORMAL HIGH (ref 211–911)

## 2014-10-01 LAB — IRON AND TIBC
IRON: 24 ug/dL — AB (ref 42–145)
SATURATION RATIOS: 8 % — AB (ref 20–55)
TIBC: 301 ug/dL (ref 250–470)
UIBC: 277 ug/dL (ref 125–400)

## 2014-10-01 LAB — FERRITIN: FERRITIN: 297 ng/mL — AB (ref 10–291)

## 2014-10-01 MED ORDER — DARBEPOETIN ALFA 100 MCG/0.5ML IJ SOSY
PREFILLED_SYRINGE | INTRAMUSCULAR | Status: AC
  Filled 2014-10-01: qty 0.5

## 2014-10-04 ENCOUNTER — Encounter (HOSPITAL_COMMUNITY): Admission: RE | Payer: Self-pay | Source: Ambulatory Visit

## 2014-10-04 ENCOUNTER — Inpatient Hospital Stay (HOSPITAL_COMMUNITY): Admission: RE | Admit: 2014-10-04 | Payer: Medicare Other | Source: Ambulatory Visit | Admitting: Orthopedic Surgery

## 2014-10-04 SURGERY — OPEN REDUCTION INTERNAL FIXATION (ORIF) DISTAL HUMERUS FRACTURE
Anesthesia: General | Laterality: Right

## 2014-10-06 ENCOUNTER — Emergency Department (HOSPITAL_COMMUNITY)
Admission: EM | Admit: 2014-10-06 | Discharge: 2014-10-06 | Disposition: A | Discharge status: Home / Self Care | Payer: Medicare Other | Attending: Emergency Medicine | Admitting: Emergency Medicine

## 2014-10-06 ENCOUNTER — Encounter (HOSPITAL_COMMUNITY): Payer: Self-pay

## 2014-10-06 ENCOUNTER — Encounter (HOSPITAL_COMMUNITY)
Admission: RE | Admit: 2014-10-06 | Discharge: 2014-10-06 | Disposition: A | Discharge status: Home / Self Care | Payer: Medicare Other | Source: Ambulatory Visit | Attending: Nephrology | Admitting: Nephrology

## 2014-10-06 DIAGNOSIS — F209 Schizophrenia, unspecified: Secondary | ICD-10-CM | POA: Insufficient documentation

## 2014-10-06 DIAGNOSIS — G8929 Other chronic pain: Secondary | ICD-10-CM | POA: Insufficient documentation

## 2014-10-06 DIAGNOSIS — I129 Hypertensive chronic kidney disease with stage 1 through stage 4 chronic kidney disease, or unspecified chronic kidney disease: Secondary | ICD-10-CM | POA: Diagnosis not present

## 2014-10-06 DIAGNOSIS — F419 Anxiety disorder, unspecified: Secondary | ICD-10-CM | POA: Insufficient documentation

## 2014-10-06 DIAGNOSIS — Z8614 Personal history of Methicillin resistant Staphylococcus aureus infection: Secondary | ICD-10-CM | POA: Diagnosis not present

## 2014-10-06 DIAGNOSIS — Z880 Allergy status to penicillin: Secondary | ICD-10-CM | POA: Insufficient documentation

## 2014-10-06 DIAGNOSIS — H409 Unspecified glaucoma: Secondary | ICD-10-CM | POA: Diagnosis not present

## 2014-10-06 DIAGNOSIS — Z8719 Personal history of other diseases of the digestive system: Secondary | ICD-10-CM | POA: Diagnosis not present

## 2014-10-06 DIAGNOSIS — I5042 Chronic combined systolic (congestive) and diastolic (congestive) heart failure: Secondary | ICD-10-CM | POA: Insufficient documentation

## 2014-10-06 DIAGNOSIS — N183 Chronic kidney disease, stage 3 (moderate): Secondary | ICD-10-CM | POA: Diagnosis not present

## 2014-10-06 DIAGNOSIS — D649 Anemia, unspecified: Secondary | ICD-10-CM | POA: Diagnosis present

## 2014-10-06 DIAGNOSIS — Z79899 Other long term (current) drug therapy: Secondary | ICD-10-CM | POA: Diagnosis not present

## 2014-10-06 DIAGNOSIS — Z87891 Personal history of nicotine dependence: Secondary | ICD-10-CM | POA: Insufficient documentation

## 2014-10-06 DIAGNOSIS — F319 Bipolar disorder, unspecified: Secondary | ICD-10-CM | POA: Insufficient documentation

## 2014-10-06 DIAGNOSIS — Z7982 Long term (current) use of aspirin: Secondary | ICD-10-CM | POA: Diagnosis not present

## 2014-10-06 DIAGNOSIS — E119 Type 2 diabetes mellitus without complications: Secondary | ICD-10-CM | POA: Insufficient documentation

## 2014-10-06 DIAGNOSIS — G40909 Epilepsy, unspecified, not intractable, without status epilepticus: Secondary | ICD-10-CM | POA: Insufficient documentation

## 2014-10-06 DIAGNOSIS — Z8673 Personal history of transient ischemic attack (TIA), and cerebral infarction without residual deficits: Secondary | ICD-10-CM | POA: Diagnosis not present

## 2014-10-06 DIAGNOSIS — D631 Anemia in chronic kidney disease: Secondary | ICD-10-CM | POA: Insufficient documentation

## 2014-10-06 DIAGNOSIS — N184 Chronic kidney disease, stage 4 (severe): Secondary | ICD-10-CM | POA: Insufficient documentation

## 2014-10-06 DIAGNOSIS — Z792 Long term (current) use of antibiotics: Secondary | ICD-10-CM | POA: Diagnosis not present

## 2014-10-06 DIAGNOSIS — Z794 Long term (current) use of insulin: Secondary | ICD-10-CM | POA: Insufficient documentation

## 2014-10-06 DIAGNOSIS — G629 Polyneuropathy, unspecified: Secondary | ICD-10-CM | POA: Diagnosis not present

## 2014-10-06 LAB — POCT HEMOGLOBIN-HEMACUE: HEMOGLOBIN: 6.1 g/dL — AB (ref 12.0–15.0)

## 2014-10-06 MED ORDER — DARBEPOETIN ALFA 100 MCG/0.5ML IJ SOSY
PREFILLED_SYRINGE | INTRAMUSCULAR | Status: AC
  Filled 2014-10-06: qty 0.5

## 2014-10-06 MED ORDER — DARBEPOETIN ALFA 100 MCG/0.5ML IJ SOSY
100.0000 ug | PREFILLED_SYRINGE | INTRAMUSCULAR | Status: DC
  Administered 2014-10-06: 100 ug via SUBCUTANEOUS

## 2014-10-06 NOTE — Progress Notes (Signed)
Pt came in today for labs and injection.  PT's HemoCue was 6.1  I called [facility].  Pt stated that we sent her to Emergency Room last week for low hemoglobin, she refused blood.  She is also refusing to go to the emergency room today and is refusing blood.  Pt states that she is [employer] witness and is afraid of contracting HIV trough a transfusion.  [facility] said to give her the shot and explain what could happen if hemoglobin remains low.  Pt verbalized understanding and is going home

## 2014-10-06 NOTE — ED Provider Notes (Signed)
CSN: [id number]     Arrival date & time 10/06/14  [id number] History   First MD Initiated Contact with Patient 10/06/14 [phone]     No chief complaint on file.    (Consider location/radiation/quality/duration/timing/severity/associated sxs/prior Treatment) HPI   49 year old female with history of hepatitis C, chronic kidney disease, iron deficiency anemia, CHF, sent from clinic to ER for management of her anemia.  Pt report she is currently receiving weekly shots of ?Aranesp to help with her anemia.  THis is her second week of getting shots.  Sts her baseline Hgb is around 5.  She did received a shot today but she made mention that she feels weak and the staff recommend pt to come to ER for further evaluation.  Pt report she now feels better after eating some food and receiving the shot.  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  She request to be discharge at this time stating she does not want any further intervention.  Currently denies fever, headache, cp, sob.  Denies blood in stools or abnormal bleeding.    Past Medical History  Diagnosis Date  . Neuropathy   . Glaucoma   . Bipolar 1 disorder   . Refusal of blood transfusions as patient is Jehovah's Witness   . Shortness of breath   . TIA (transient ischemic attack) 2010  . Neuromuscular disorder     DIABETIC NEUROPATHY  . MRSA infection ?2014    "on the back of my head; spread throughout my bloodstream"  . Bipolar disorder, unspecified 02/06/2014  . High cholesterol   . Type II diabetes mellitus     INSULIN DEPENDENT  . Hepatitis C   . Chronic pain syndrome   . Chronic kidney disease (CKD), stage III (moderate)   . Schizoaffective disorder, unspecified condition 02/08/2014  . Seizure dx'd 2014    "don't know what kind; last one was ~ 04/2014"  . Chronic combined systolic and diastolic CHF (congestive heart failure)     EF 40% by echo and 48% by stress test  . Anemia   . Hypertension   . Stroke     hx TIA  .  Neuromuscular disorder     diabetic neuropathy  . Shortness of breath dyspnea   . Schizophrenia   . Seizures   . Hepatitis     hep C  . Bipolar disorder   . Glaucoma   . Chronic pain   . Diabetes mellitus without complication     on Insulin  . Depression   . Anxiety   . Chronic kidney disease     chronic kidney stage 3 Dr Mercy Moore  . GERD (gastroesophageal reflux disease)   . History of hiatal hernia   . Headache     sinus headaches   Past Surgical History  Procedure Laterality Date  . Amputation Left 05/21/2013    Procedure: Left Midfoot AMPUTATION;  Surgeon: Newt Minion, MD;  Location: [facility];  Service: Orthopedics;  Laterality: Left;  Left Midfoot Amputation  . Tubal ligation    . Colonoscopy with propofol N/A 06/22/2013    Procedure: COLONOSCOPY WITH PROPOFOL;  Surgeon: Jeryl Columbia, MD;  Location: [facility];  Service: Endoscopy;  Laterality: N/A;  . Abdominal hysterectomy  2014  . Tonsillectomy  1970's  . Refractive surgery Bilateral 2013  . Tubal ligation  2000  . Abdominal hysterectomy  2010  . Foot amputation Left     due diabetic neuropathy, could not feel ulcer on bottom  of foot  . Eye surgery Bilateral 2010    Lasik   Family History  Problem Relation Age of Onset  . Hypertension Mother   . Hypertension Father    History  Substance Use Topics  . Smoking status: Former Smoker -- 1.00 packs/day for 20 years    Types: Cigarettes    Quit date: 10/01/2011  . Smokeless tobacco: Never Used     Comment: "stopped smoking in ~ 2013; restarted last week; stopped again 06/17/2014"  . Alcohol Use: 18.0 oz/week    30 Cans of beer per week     Comment: 06/22/2014 "2-3, 40oz beers; 3 days/wk"   OB History    Gravida Para Term Preterm AB TAB SAB Ectopic Multiple Living   0 0 0 0 0 0 0 0       Review of Systems  Constitutional: Negative for fever.  Respiratory: Negative for chest tightness and shortness of breath.   Cardiovascular: Negative for chest pain.   Gastrointestinal: Negative for abdominal pain and blood in stool.  Neurological: Positive for weakness. Negative for headaches.  All other systems reviewed and are negative.     Allergies  Gabapentin; Gabapentin; Lyrica; Penicillins; Saphris; Saphris; Tramadol; Tramadol; and Penicillins  Home Medications   Prior to Admission medications   Medication Sig Start Date End Date Taking? Authorizing Provider  amLODipine (NORVASC) 5 MG tablet Take 5 mg by mouth daily.  09/15/14   Historical Provider, MD  aspirin EC 325 MG EC tablet Take 1 tablet (325 mg total) by mouth daily. 08/27/14   Geradine Girt, DO  aspirin EC 81 MG tablet Take 81 mg by mouth daily.    Historical Provider, MD  carvedilol (COREG) 25 MG tablet Take 1 tablet (25 mg total) by mouth 2 (two) times daily. 06/24/14   Debbe Odea, MD  carvedilol (COREG) 25 MG tablet Take 25 mg by mouth 2 (two) times daily with a meal.  08/24/14   Historical Provider, MD  cephALEXin (KEFLEX) 500 MG capsule Take 1 capsule (500 mg total) by mouth every 12 (twelve) hours. 08/27/14   Geradine Girt, DO  cloNIDine (CATAPRES) 0.1 MG tablet  06/25/14   Historical Provider, MD  cloNIDine (CATAPRES) 0.2 MG tablet Take 0.2 mg by mouth at bedtime.  08/28/14   Historical Provider, MD  Fe Bisgly-Vit C-Vit B12-FA 28-60-0.008-0.4 MG CAPS Take 1 tablet by mouth daily.    Historical Provider, MD  ferrous sulfate 325 (65 FE) MG tablet Take 1 tablet (325 mg total) by mouth daily with breakfast. 06/24/14   Debbe Odea, MD  folic acid (FOLVITE) [id number] MCG tablet Take 400 mcg by mouth every morning.    Historical Provider, MD  furosemide (LASIX) 40 MG tablet Take 2 tablets (80 mg total) by mouth 2 (two) times daily. 08/09/14   Larene Pickett, PA-C  furosemide (LASIX) 40 MG tablet Take 80 mg by mouth 2 (two) times daily.  08/24/14   Historical Provider, MD  insulin aspart (NOVOLOG) 100 UNIT/ML injection Inject 2-10 Units into the skin 3 (three) times daily before meals. SSI:  0-200 = 2 units, 201-250 = 4 units, 251-300 = 6 units, 301-350 = 8 units, 351-400 = 10 units    Historical Provider, MD  insulin aspart (NOVOLOG) 100 UNIT/ML injection Inject 2-10 Units into the skin 3 (three) times daily before meals. Sliding scale: CBG 0-200 2 units, 201-250 4 units, 251-300 6 units, 301-350 8 units, 351-400 10 units    Historical Provider, MD  insulin glargine (LANTUS) 100 UNIT/ML injection Inject 0.3 mLs (30 Units total) into the skin daily. 08/27/14   Geradine Girt, DO  insulin glargine (LANTUS) 100 unit/mL SOPN Inject 25 Units into the skin at bedtime.    Historical Provider, MD  Lactobacillus (ACIDOPHILUS) 100 MG CAPS Take 100 mg by mouth daily.    Historical Provider, MD  levETIRAcetam (KEPPRA) 500 MG tablet Take 1 tablet (500 mg total) by mouth 2 (two) times daily. Patient taking differently: Take 1,000 mg by mouth at bedtime.  06/24/14   Debbe Odea, MD  levETIRAcetam (KEPPRA) 500 MG tablet Take 1,000 mg by mouth at bedtime.  06/25/14   Historical Provider, MD  Multiple Vitamin (MULTIVITAMIN WITH MINERALS) TABS tablet Take 1 tablet by mouth daily.    Historical Provider, MD  Multiple Vitamins-Minerals (SENIOR MULTIVITAMIN PLUS PO) Take 1 tablet by mouth every morning.    Historical Provider, MD  mupirocin cream (BACTROBAN) 2 % Apply topically daily. 08/27/14   Geradine Girt, DO  mupirocin ointment (BACTROBAN) 2 % Apply 1 application topically 2 (two) times daily as needed (wound care- buttocks and feet).  08/16/14   Historical Provider, MD  OLANZapine (ZYPREXA) 5 MG tablet  07/22/14   Historical Provider, MD  OLANZapine (ZYPREXA) 5 MG tablet Take 5 mg by mouth at bedtime.  09/19/14   Historical Provider, MD  oxyCODONE-acetaminophen (PERCOCET) 10-325 MG per tablet Take 1 tablet by mouth every 4 (four) hours as needed for pain.  09/22/14   Historical Provider, MD  oxyCODONE-acetaminophen (PERCOCET/ROXICET) 5-325 MG per tablet Take 1 tablet by mouth every 6 (six) hours as needed  for moderate pain. 08/27/14   Geradine Girt, DO  polyethylene glycol (MIRALAX / GLYCOLAX) packet Take 17 g by mouth 2 (two) times daily.    Historical Provider, MD  polyethylene glycol (MIRALAX / GLYCOLAX) packet Take 17 g by mouth 2 (two) times daily. Mix in 8 oz liquid and drink 08/24/14   Historical Provider, MD  pregabalin (LYRICA) 75 MG capsule Take 75 mg by mouth 2 (two) times daily.    Historical Provider, MD  silver sulfADIAZINE (SILVADENE) 1 % cream Apply 1 application topically at bedtime as needed (wound care -right foot).    Historical Provider, MD  zolpidem (AMBIEN) 5 MG tablet Take 5 mg by mouth at bedtime as needed for sleep.    Historical Provider, MD  zolpidem (AMBIEN) 5 MG tablet Take 5 mg by mouth at bedtime as needed for sleep.  09/19/14   Historical Provider, MD   There were no vitals taken for this visit. Physical Exam  Constitutional: She is oriented to person, place, and time. She appears well-developed and well-nourished. No distress.  HENT:  Head: Atraumatic.  Eyes:  Conjunctiva pale  Neck: Neck supple.  Musculoskeletal:  Mechanical brace to R elbow and forearm.  Neurological: She is alert and oriented to person, place, and time.  Skin: No rash noted.  Psychiatric: She has a normal mood and affect.  Nursing note and vitals reviewed.   ED Course  Procedures (including critical care time)  Pt with hx of chronic anemia, and a Jehovah's witness who refuses blood transfusion.  She request to be discharge stating she does not want any additional care in the ER.  She is here at the request of her clinic where she received aranesp.  She has full capacity to make informed decision.  She is hemodynamically stable.  Care discussed with Dr. Tomi Bamberger.    Labs Review  Labs Reviewed - No data to display  Imaging Review No results found.   EKG Interpretation None      MDM   Final diagnoses:  Chronic anemia    BP 140/70 mmHg  Pulse 71  Temp(Src) 97.4 F (36.3 C)  (Oral)  Resp 12  SpO2 100%     Domenic Moras, PA-C 10/06/14 1030  Dorie Rank, MD 10/06/14 1039

## 2014-10-06 NOTE — Discharge Instructions (Signed)
Please continue with your regular injection to help with your anemia  Iron Deficiency Anemia Anemia is a condition in which there are less red blood cells or hemoglobin in the blood than normal. Hemoglobin is the part of red blood cells that carries oxygen. Iron deficiency anemia is anemia caused by too little iron. It is the most common type of anemia. It may leave you tired and short of breath. CAUSES   Lack of iron in the diet.  Poor absorption of iron, as seen with intestinal disorders.  Intestinal bleeding.  Heavy periods. SIGNS AND SYMPTOMS  Mild anemia may not be noticeable. Symptoms may include:  Fatigue.  Headache.  Pale skin.  Weakness.  Tiredness.  Shortness of breath.  Dizziness.  Cold hands and feet.  Fast or irregular heartbeat. DIAGNOSIS  Diagnosis requires a thorough evaluation and physical exam by your health care provider. Blood tests are generally used to confirm iron deficiency anemia. Additional tests may be done to find the underlying cause of your anemia. These may include:  Testing for blood in the stool (fecal occult blood test).  A procedure to see inside the colon and rectum (colonoscopy).  A procedure to see inside the esophagus and stomach (endoscopy). TREATMENT  Iron deficiency anemia is treated by correcting the cause of the deficiency. Treatment may involve:  Adding iron-rich foods to your diet.  Taking iron supplements. Pregnant or breastfeeding women need to take extra iron because their normal diet usually does not provide the required amount.  Taking vitamins. Vitamin C improves the absorption of iron. Your health care provider may recommend that you take your iron tablets with a glass of orange juice or vitamin C supplement.  Medicines to make heavy menstrual flow lighter.  Surgery. HOME CARE INSTRUCTIONS   Take iron as directed by your health care provider.  If you cannot tolerate taking iron supplements by mouth, talk to  your health care provider about taking them through a vein (intravenously) or an injection into a muscle.  For the best iron absorption, iron supplements should be taken on an empty stomach. If you cannot tolerate them on an empty stomach, you may need to take them with food.  Do not drink milk or take antacids at the same time as your iron supplements. Milk and antacids may interfere with the absorption of iron.  Iron supplements can cause constipation. Make sure to include fiber in your diet to prevent constipation. A stool softener may also be recommended.  Take vitamins as directed by your health care provider.  Eat a diet rich in iron. Foods high in iron include liver, lean beef, whole-grain bread, eggs, dried fruit, and dark green leafy vegetables. SEEK IMMEDIATE MEDICAL CARE IF:   You faint. If this happens, do not drive. Call your local emergency services (911 in [place] if no other help is available.  You have chest pain.  You feel nauseous or vomit.  You have severe or increased shortness of breath with activity.  You feel weak.  You have a rapid heartbeat.  You have unexplained sweating.  You become light-headed when getting up from a chair or bed. MAKE SURE YOU:   Understand these instructions.  Will watch your condition.  Will get help right away if you are not doing well or get worse. Document Released: 07/19/2000 Document Revised: 07/27/2013 Document Reviewed: 03/29/2013 [facility] [employer] Patient Information 2015 [place], [place]. This information is not intended to replace advice given to you by your health care provider. Make  sure you discuss any questions you have with your health care provider.

## 2014-10-14 ENCOUNTER — Encounter (HOSPITAL_COMMUNITY): Payer: Self-pay

## 2014-10-27 ENCOUNTER — Encounter (HOSPITAL_COMMUNITY): Payer: Self-pay | Admitting: Emergency Medicine

## 2014-10-27 ENCOUNTER — Emergency Department (HOSPITAL_COMMUNITY)
Admission: EM | Admit: 2014-10-27 | Discharge: 2014-11-01 | Disposition: A | Discharge status: Home / Self Care | Payer: Medicare Other | Attending: Emergency Medicine | Admitting: Emergency Medicine

## 2014-10-27 DIAGNOSIS — Z79899 Other long term (current) drug therapy: Secondary | ICD-10-CM | POA: Insufficient documentation

## 2014-10-27 DIAGNOSIS — H409 Unspecified glaucoma: Secondary | ICD-10-CM | POA: Insufficient documentation

## 2014-10-27 DIAGNOSIS — Z7982 Long term (current) use of aspirin: Secondary | ICD-10-CM | POA: Insufficient documentation

## 2014-10-27 DIAGNOSIS — G894 Chronic pain syndrome: Secondary | ICD-10-CM | POA: Insufficient documentation

## 2014-10-27 DIAGNOSIS — D649 Anemia, unspecified: Secondary | ICD-10-CM | POA: Diagnosis not present

## 2014-10-27 DIAGNOSIS — Z8619 Personal history of other infectious and parasitic diseases: Secondary | ICD-10-CM | POA: Insufficient documentation

## 2014-10-27 DIAGNOSIS — R05 Cough: Secondary | ICD-10-CM | POA: Diagnosis not present

## 2014-10-27 DIAGNOSIS — R079 Chest pain, unspecified: Secondary | ICD-10-CM | POA: Diagnosis not present

## 2014-10-27 DIAGNOSIS — Z87891 Personal history of nicotine dependence: Secondary | ICD-10-CM | POA: Insufficient documentation

## 2014-10-27 DIAGNOSIS — Z8673 Personal history of transient ischemic attack (TIA), and cerebral infarction without residual deficits: Secondary | ICD-10-CM | POA: Diagnosis not present

## 2014-10-27 DIAGNOSIS — Z8719 Personal history of other diseases of the digestive system: Secondary | ICD-10-CM | POA: Diagnosis not present

## 2014-10-27 DIAGNOSIS — Z880 Allergy status to penicillin: Secondary | ICD-10-CM | POA: Diagnosis not present

## 2014-10-27 DIAGNOSIS — R45851 Suicidal ideations: Secondary | ICD-10-CM | POA: Insufficient documentation

## 2014-10-27 DIAGNOSIS — Z792 Long term (current) use of antibiotics: Secondary | ICD-10-CM | POA: Insufficient documentation

## 2014-10-27 DIAGNOSIS — N183 Chronic kidney disease, stage 3 (moderate): Secondary | ICD-10-CM | POA: Diagnosis not present

## 2014-10-27 DIAGNOSIS — R0989 Other specified symptoms and signs involving the circulatory and respiratory systems: Secondary | ICD-10-CM | POA: Insufficient documentation

## 2014-10-27 DIAGNOSIS — G40909 Epilepsy, unspecified, not intractable, without status epilepticus: Secondary | ICD-10-CM | POA: Insufficient documentation

## 2014-10-27 DIAGNOSIS — R45850 Homicidal ideations: Secondary | ICD-10-CM | POA: Diagnosis not present

## 2014-10-27 DIAGNOSIS — I129 Hypertensive chronic kidney disease with stage 1 through stage 4 chronic kidney disease, or unspecified chronic kidney disease: Secondary | ICD-10-CM | POA: Insufficient documentation

## 2014-10-27 DIAGNOSIS — E119 Type 2 diabetes mellitus without complications: Secondary | ICD-10-CM | POA: Diagnosis not present

## 2014-10-27 DIAGNOSIS — F1910 Other psychoactive substance abuse, uncomplicated: Secondary | ICD-10-CM | POA: Diagnosis not present

## 2014-10-27 DIAGNOSIS — I5042 Chronic combined systolic (congestive) and diastolic (congestive) heart failure: Secondary | ICD-10-CM | POA: Insufficient documentation

## 2014-10-27 DIAGNOSIS — F259 Schizoaffective disorder, unspecified: Secondary | ICD-10-CM | POA: Diagnosis not present

## 2014-10-27 DIAGNOSIS — Z794 Long term (current) use of insulin: Secondary | ICD-10-CM | POA: Insufficient documentation

## 2014-10-27 LAB — CBC
HCT: 23.5 % — ABNORMAL LOW (ref 36.0–46.0)
Hemoglobin: 7.4 g/dL — ABNORMAL LOW (ref 12.0–15.0)
MCH: 27.4 pg (ref 26.0–34.0)
MCHC: 31.5 g/dL (ref 30.0–36.0)
MCV: 87 fL (ref 78.0–100.0)
Platelets: 298 10*3/uL (ref 150–400)
RBC: 2.7 MIL/uL — ABNORMAL LOW (ref 3.87–5.11)
RDW: 13.4 % (ref 11.5–15.5)
WBC: 3.9 10*3/uL — AB (ref 4.0–10.5)

## 2014-10-27 LAB — RAPID URINE DRUG SCREEN, HOSP PERFORMED
AMPHETAMINES: NOT DETECTED
BENZODIAZEPINES: NOT DETECTED
Barbiturates: NOT DETECTED
Cocaine: POSITIVE — AB
Opiates: NOT DETECTED
Tetrahydrocannabinol: NOT DETECTED

## 2014-10-27 LAB — BASIC METABOLIC PANEL
ANION GAP: 5 (ref 5–15)
BUN: 57 mg/dL — AB (ref 6–23)
CHLORIDE: 106 mmol/L (ref 96–112)
CO2: 25 mmol/L (ref 19–32)
CREATININE: 4.86 mg/dL — AB (ref 0.50–1.10)
Calcium: 8.5 mg/dL (ref 8.4–10.5)
GFR calc Af Amer: 11 mL/min — ABNORMAL LOW (ref >90)
GFR calc non Af Amer: 10 mL/min — ABNORMAL LOW (ref >90)
GLUCOSE: 413 mg/dL — AB (ref 70–99)
Potassium: 4 mmol/L (ref 3.5–5.1)
Sodium: 136 mmol/L (ref 135–145)

## 2014-10-27 LAB — ETHANOL

## 2014-10-27 LAB — I-STAT TROPONIN, ED: TROPONIN I, POC: 0.01 ng/mL (ref 0.00–0.08)

## 2014-10-27 LAB — BRAIN NATRIURETIC PEPTIDE: B Natriuretic Peptide: 63.7 pg/mL (ref 0.0–100.0)

## 2014-10-27 LAB — SALICYLATE LEVEL: Salicylate Lvl: <4 mg/dL (ref 2.8–20.0)

## 2014-10-27 LAB — ACETAMINOPHEN LEVEL: Acetaminophen (Tylenol), Serum: <10 ug/mL — ABNORMAL LOW (ref 10–30)

## 2014-10-27 NOTE — ED Notes (Signed)
Patient in paper scrubs and wanded by security prt this EMT

## 2014-10-27 NOTE — ED Notes (Addendum)
To ED via private vehicle with c/o chest congestion, productive cough for greenish yellowish sputum, also has hx of anemia - receives "hemoglobin shots" -- pt has stage 4 kidney disease-- refuses dialysis.  Pt states not taking psych medicine-- has been hearing voices -- "just chaos in my head-- the other day- a bunch of voices in different languages were in my head, scared me to where I looked out the door" No meds x 2-3 months. "I just want to die-- no plans on how I am going to carry it out or anything" was in [place] "a few years ago"

## 2014-10-27 NOTE — ED Notes (Signed)
Pt to ED via [facility]

## 2014-10-27 NOTE — ED Provider Notes (Signed)
CSN: [id number]     Arrival date & time 10/27/14  2053 History   First MD Initiated Contact with Patient 10/27/14 2312     Chief Complaint  Patient presents with  . URI  . Cough  . Paranoid  . Suicidal  . Homicidal  . Hallucinations     (Consider location/radiation/quality/duration/timing/severity/associated sxs/prior Treatment) HPI Comments: Patient presents emergency department with chief complaint of chest congestion, cough, and chest pain. Patient states that she is in a downward spiral. She states that she is off for medications. States that she has started using drugs and alcohol again. She reports using crack cocaine. Reports having some chest pain today. She also reports having suicidal and homicidal ideations, though she does not have a plan. She reports both auditory and visual hallucinations. States that she has been hearing multiple languages in her head. She states that she is concerned that she might have a CHF exacerbation. There are no aggravating or alleviating factors.  The history is provided by the patient. No language interpreter was used.    Past Medical History  Diagnosis Date  . Neuropathy   . Glaucoma   . Bipolar 1 disorder   . Refusal of blood transfusions as patient is Jehovah's Witness   . Shortness of breath   . TIA (transient ischemic attack) 2010  . Neuromuscular disorder     DIABETIC NEUROPATHY  . MRSA infection ?2014    "on the back of my head; spread throughout my bloodstream"  . Bipolar disorder, unspecified 02/06/2014  . High cholesterol   . Type II diabetes mellitus     INSULIN DEPENDENT  . Hepatitis C   . Chronic pain syndrome   . Chronic kidney disease (CKD), stage III (moderate)   . Schizoaffective disorder, unspecified condition 02/08/2014  . Seizure dx'd 2014    "don't know what kind; last one was ~ 04/2014"  . Chronic combined systolic and diastolic CHF (congestive heart failure)     EF 40% by echo and 48% by stress test  . Anemia   .  Hypertension   . Stroke     hx TIA  . Neuromuscular disorder     diabetic neuropathy  . Shortness of breath dyspnea   . Schizophrenia   . Seizures   . Hepatitis     hep C  . Bipolar disorder   . Glaucoma   . Chronic pain   . Diabetes mellitus without complication     on Insulin  . Depression   . Anxiety   . Chronic kidney disease     chronic kidney stage 3 Dr Mercy Moore  . GERD (gastroesophageal reflux disease)   . History of hiatal hernia   . Headache     sinus headaches   Past Surgical History  Procedure Laterality Date  . Amputation Left 05/21/2013    Procedure: Left Midfoot AMPUTATION;  Surgeon: Newt Minion, MD;  Location: [facility];  Service: Orthopedics;  Laterality: Left;  Left Midfoot Amputation  . Tubal ligation    . Colonoscopy with propofol N/A 06/22/2013    Procedure: COLONOSCOPY WITH PROPOFOL;  Surgeon: Jeryl Columbia, MD;  Location: [facility];  Service: Endoscopy;  Laterality: N/A;  . Abdominal hysterectomy  2014  . Tonsillectomy  1970's  . Refractive surgery Bilateral 2013  . Tubal ligation  2000  . Abdominal hysterectomy  2010  . Foot amputation Left     due diabetic neuropathy, could not feel ulcer on bottom of foot  .  Eye surgery Bilateral 2010    Lasik   Family History  Problem Relation Age of Onset  . Hypertension Mother   . Hypertension Father    History  Substance Use Topics  . Smoking status: Former Smoker -- 1.00 packs/day for 20 years    Types: Cigarettes    Quit date: 10/01/2011  . Smokeless tobacco: Never Used     Comment: "stopped smoking in ~ 2013; restarted last week; stopped again 06/17/2014"  . Alcohol Use: 18.0 oz/week    30 Cans of beer per week     Comment: "2-3, 40oz beers; 3 days/wk"   OB History    Gravida Para Term Preterm AB TAB SAB Ectopic Multiple Living   0 0 0 0 0 0 0 0       Review of Systems  Constitutional: Negative for fever and chills.  Respiratory: Negative for shortness of breath.   Cardiovascular:  Positive for chest pain.  Gastrointestinal: Negative for nausea, vomiting, diarrhea and constipation.  Genitourinary: Negative for dysuria.  Psychiatric/Behavioral: Positive for suicidal ideas, hallucinations and behavioral problems.  All other systems reviewed and are negative.     Allergies  Gabapentin; Gabapentin; Lyrica; Penicillins; Saphris; Saphris; Tramadol; Tramadol; and Penicillins  Home Medications   Prior to Admission medications   Medication Sig Start Date End Date Taking? Authorizing Provider  amLODipine (NORVASC) 5 MG tablet Take 5 mg by mouth daily.  09/15/14   Historical Provider, MD  aspirin EC 325 MG EC tablet Take 1 tablet (325 mg total) by mouth daily. 08/27/14   Geradine Girt, DO  aspirin EC 81 MG tablet Take 81 mg by mouth daily.    Historical Provider, MD  carvedilol (COREG) 25 MG tablet Take 1 tablet (25 mg total) by mouth 2 (two) times daily. 06/24/14   Debbe Odea, MD  carvedilol (COREG) 25 MG tablet Take 25 mg by mouth 2 (two) times daily with a meal.  08/24/14   Historical Provider, MD  cephALEXin (KEFLEX) 500 MG capsule Take 1 capsule (500 mg total) by mouth every 12 (twelve) hours. 08/27/14   Geradine Girt, DO  cloNIDine (CATAPRES) 0.1 MG tablet  06/25/14   Historical Provider, MD  cloNIDine (CATAPRES) 0.2 MG tablet Take 0.2 mg by mouth at bedtime.  08/28/14   Historical Provider, MD  Fe Bisgly-Vit C-Vit B12-FA 28-60-0.008-0.4 MG CAPS Take 1 tablet by mouth daily.    Historical Provider, MD  ferrous sulfate 325 (65 FE) MG tablet Take 1 tablet (325 mg total) by mouth daily with breakfast. 06/24/14   Debbe Odea, MD  folic acid (FOLVITE) A999333 MCG tablet Take 400 mcg by mouth every morning.    Historical Provider, MD  furosemide (LASIX) 40 MG tablet Take 2 tablets (80 mg total) by mouth 2 (two) times daily. 08/09/14   Larene Pickett, PA-C  furosemide (LASIX) 40 MG tablet Take 80 mg by mouth 2 (two) times daily.  08/24/14   Historical Provider, MD  insulin aspart  (NOVOLOG) 100 UNIT/ML injection Inject 2-10 Units into the skin 3 (three) times daily before meals. SSI: 0-200 = 2 units, 201-250 = 4 units, 251-300 = 6 units, 301-350 = 8 units, 351-400 = 10 units    Historical Provider, MD  insulin aspart (NOVOLOG) 100 UNIT/ML injection Inject 2-10 Units into the skin 3 (three) times daily before meals. Sliding scale: CBG 0-200 2 units, 201-250 4 units, 251-300 6 units, 301-350 8 units, 351-400 10 units    Historical Provider, MD  insulin glargine (LANTUS) 100 UNIT/ML injection Inject 0.3 mLs (30 Units total) into the skin daily. 08/27/14   Geradine Girt, DO  insulin glargine (LANTUS) 100 unit/mL SOPN Inject 25 Units into the skin at bedtime.    Historical Provider, MD  Lactobacillus (ACIDOPHILUS) 100 MG CAPS Take 100 mg by mouth daily.    Historical Provider, MD  levETIRAcetam (KEPPRA) 500 MG tablet Take 1 tablet (500 mg total) by mouth 2 (two) times daily. Patient taking differently: Take 1,000 mg by mouth at bedtime.  06/24/14   Debbe Odea, MD  levETIRAcetam (KEPPRA) 500 MG tablet Take 1,000 mg by mouth at bedtime.  06/25/14   Historical Provider, MD  Multiple Vitamin (MULTIVITAMIN WITH MINERALS) TABS tablet Take 1 tablet by mouth daily.    Historical Provider, MD  Multiple Vitamins-Minerals (SENIOR MULTIVITAMIN PLUS PO) Take 1 tablet by mouth every morning.    Historical Provider, MD  mupirocin cream (BACTROBAN) 2 % Apply topically daily. 08/27/14   Geradine Girt, DO  mupirocin ointment (BACTROBAN) 2 % Apply 1 application topically 2 (two) times daily as needed (wound care- buttocks and feet).  08/16/14   Historical Provider, MD  OLANZapine (ZYPREXA) 5 MG tablet  07/22/14   Historical Provider, MD  OLANZapine (ZYPREXA) 5 MG tablet Take 5 mg by mouth at bedtime.  09/19/14   Historical Provider, MD  oxyCODONE-acetaminophen (PERCOCET) 10-325 MG per tablet Take 1 tablet by mouth every 4 (four) hours as needed for pain.  09/22/14   Historical Provider, MD   oxyCODONE-acetaminophen (PERCOCET/ROXICET) 5-325 MG per tablet Take 1 tablet by mouth every 6 (six) hours as needed for moderate pain. 08/27/14   Geradine Girt, DO  polyethylene glycol (MIRALAX / GLYCOLAX) packet Take 17 g by mouth 2 (two) times daily.    Historical Provider, MD  polyethylene glycol (MIRALAX / GLYCOLAX) packet Take 17 g by mouth 2 (two) times daily. Mix in 8 oz liquid and drink 08/24/14   Historical Provider, MD  pregabalin (LYRICA) 75 MG capsule Take 75 mg by mouth 2 (two) times daily.    Historical Provider, MD  silver sulfADIAZINE (SILVADENE) 1 % cream Apply 1 application topically at bedtime as needed (wound care -right foot).    Historical Provider, MD  zolpidem (AMBIEN) 5 MG tablet Take 5 mg by mouth at bedtime as needed for sleep.    Historical Provider, MD  zolpidem (AMBIEN) 5 MG tablet Take 5 mg by mouth at bedtime as needed for sleep.  09/19/14   Historical Provider, MD   BP 151/82 mmHg  Pulse 93  Temp(Src) 98.2 F (36.8 C) (Oral)  Resp 16  SpO2 100% Physical Exam  Constitutional: She is oriented to person, place, and time. She appears well-developed and well-nourished.  HENT:  Head: Normocephalic and atraumatic.  Eyes: Conjunctivae and EOM are normal. Pupils are equal, round, and reactive to light.  Neck: Normal range of motion. Neck supple.  Cardiovascular: Normal rate and regular rhythm.  Exam reveals no gallop and no friction rub.   No murmur heard. Pulmonary/Chest: Effort normal and breath sounds normal. No respiratory distress. She has no wheezes. She has no rales. She exhibits no tenderness.  Abdominal: Soft. Bowel sounds are normal. She exhibits no distension and no mass. There is no tenderness. There is no rebound and no guarding.  Musculoskeletal: Normal range of motion. She exhibits no edema or tenderness.  Neurological: She is alert and oriented to person, place, and time.  Skin: Skin is warm and  dry.  Psychiatric: She has a normal mood and affect.  Her behavior is normal. Judgment and thought content normal.  Nursing note and vitals reviewed.   ED Course  Procedures (including critical care time) Labs Review Labs Reviewed  CBC - Abnormal; Notable for the following:    WBC 3.9 (*)    RBC 2.70 (*)    Hemoglobin 7.4 (*)    HCT 23.5 (*)    All other components within normal limits  BASIC METABOLIC PANEL - Abnormal; Notable for the following:    Glucose, Bld 413 (*)    BUN 57 (*)    Creatinine, Ser 4.86 (*)    GFR calc non Af Amer 10 (*)    GFR calc Af Amer 11 (*)    All other components within normal limits  URINE RAPID DRUG SCREEN (HOSP PERFORMED) - Abnormal; Notable for the following:    Cocaine POSITIVE (*)    All other components within normal limits  ACETAMINOPHEN LEVEL - Abnormal; Notable for the following:    Acetaminophen (Tylenol), Serum <10.0 (*)    All other components within normal limits  BRAIN NATRIURETIC PEPTIDE  ETHANOL  SALICYLATE LEVEL  I-STAT TROPOININ, ED    Imaging Review No results found.   EKG Interpretation None      MDM   Final diagnoses:  None    Patient has history of diabetes, stage IV CKD, and multiple psychiatric problems. Reports having chest pain today. Initial troponin is negative. Chest x-ray negative. Labs are baseline or better. Because patient did have a crack cocaine use, will check delta troponin. Then will consult TTS for psychiatric evaluation. Patient discussed with Dr. Claudine Mouton, who agrees with the plan. Will give fluids to help drive down blood glucose.   Patient will be medically clear pending negative delta troponin and will be ready for TTS consultation.  Patient signed out to Tysinger, NP, who will follow-up on troponin.     Montine Circle, PA-C 10/28/14 0045  Everlene Balls, MD 10/28/14 1316

## 2014-10-28 ENCOUNTER — Ambulatory Visit (HOSPITAL_COMMUNITY)
Admission: RE | Admit: 2014-10-28 | Discharge: 2014-10-28 | Disposition: A | Discharge status: Home / Self Care | Payer: Medicare Other | Source: Ambulatory Visit | Attending: Nephrology | Admitting: Nephrology

## 2014-10-28 DIAGNOSIS — R45851 Suicidal ideations: Secondary | ICD-10-CM | POA: Diagnosis not present

## 2014-10-28 LAB — I-STAT TROPONIN, ED
Troponin i, poc: 0.01 ng/mL (ref 0.00–0.08)
Troponin i, poc: 0.01 ng/mL (ref 0.00–0.08)

## 2014-10-28 LAB — IRON AND TIBC
IRON: 44 ug/dL (ref 42–145)
Saturation Ratios: 15 % — ABNORMAL LOW (ref 20–55)
TIBC: 288 ug/dL (ref 250–470)
UIBC: 244 ug/dL (ref 125–400)

## 2014-10-28 LAB — POCT HEMOGLOBIN-HEMACUE: Hemoglobin: 6.6 g/dL — CL (ref 12.0–15.0)

## 2014-10-28 LAB — FERRITIN: FERRITIN: 233 ng/mL (ref 10–291)

## 2014-10-28 MED ORDER — DARBEPOETIN ALFA 100 MCG/0.5ML IJ SOSY
100.0000 ug | PREFILLED_SYRINGE | INTRAMUSCULAR | Status: DC
  Administered 2014-10-28: 100 ug via SUBCUTANEOUS

## 2014-10-28 MED ORDER — OLANZAPINE 5 MG PO TABS
5.0000 mg | ORAL_TABLET | Freq: Every day | ORAL | Status: DC
  Administered 2014-10-28 – 2014-10-31 (×5): 5 mg via ORAL
  Filled 2014-10-28 (×6): qty 1

## 2014-10-28 MED ORDER — AMLODIPINE BESYLATE 5 MG PO TABS
5.0000 mg | ORAL_TABLET | Freq: Every day | ORAL | Status: DC
  Administered 2014-10-28 – 2014-11-01 (×5): 5 mg via ORAL
  Filled 2014-10-28 (×5): qty 1

## 2014-10-28 MED ORDER — MORPHINE SULFATE 4 MG/ML IJ SOLN
4.0000 mg | Freq: Once | INTRAMUSCULAR | Status: AC
  Administered 2014-10-28: 4 mg via INTRAVENOUS
  Filled 2014-10-28: qty 1

## 2014-10-28 MED ORDER — ONDANSETRON HCL 4 MG PO TABS: 4.0000 mg | ORAL_TABLET | Freq: Three times a day (TID) | ORAL | Status: DC | PRN

## 2014-10-28 MED ORDER — SODIUM CHLORIDE 0.9 % IV BOLUS (SEPSIS)
1000.0000 mL | Freq: Once | INTRAVENOUS | Status: AC
  Administered 2014-10-28: 1000 mL via INTRAVENOUS

## 2014-10-28 MED ORDER — ZOLPIDEM TARTRATE 5 MG PO TABS: 5.0000 mg | ORAL_TABLET | Freq: Every evening | ORAL | Status: DC | PRN

## 2014-10-28 MED ORDER — ASPIRIN EC 325 MG PO TBEC
325.0000 mg | DELAYED_RELEASE_TABLET | Freq: Every day | ORAL | Status: DC
  Administered 2014-10-28 – 2014-11-01 (×5): 325 mg via ORAL
  Filled 2014-10-28 (×5): qty 1

## 2014-10-28 MED ORDER — ACETAMINOPHEN 325 MG PO TABS
650.0000 mg | ORAL_TABLET | ORAL | Status: DC | PRN
  Administered 2014-10-28 – 2014-10-31 (×7): 650 mg via ORAL
  Filled 2014-10-28 (×7): qty 2

## 2014-10-28 MED ORDER — IBUPROFEN 200 MG PO TABS: 600.0000 mg | ORAL_TABLET | Freq: Three times a day (TID) | ORAL | Status: DC | PRN

## 2014-10-28 MED ORDER — FERROUS SULFATE 325 (65 FE) MG PO TABS
325.0000 mg | ORAL_TABLET | Freq: Every day | ORAL | Status: DC
Start: 2014-10-28 — End: 2014-11-01
  Administered 2014-10-28 – 2014-11-01 (×5): 325 mg via ORAL
  Filled 2014-10-28 (×9): qty 1

## 2014-10-28 MED ORDER — CARVEDILOL 25 MG PO TABS
25.0000 mg | ORAL_TABLET | Freq: Two times a day (BID) | ORAL | Status: DC
  Administered 2014-10-28 – 2014-11-01 (×9): 25 mg via ORAL
  Filled 2014-10-28 (×14): qty 1

## 2014-10-28 MED ORDER — CLONIDINE HCL 0.2 MG PO TABS
0.2000 mg | ORAL_TABLET | Freq: Every day | ORAL | Status: DC
  Administered 2014-10-28 – 2014-10-31 (×5): 0.2 mg via ORAL
  Filled 2014-10-28 (×5): qty 1

## 2014-10-28 MED ORDER — DARBEPOETIN ALFA 100 MCG/0.5ML IJ SOSY
PREFILLED_SYRINGE | INTRAMUSCULAR | Status: AC
  Filled 2014-10-28: qty 0.5

## 2014-10-28 MED ORDER — LORAZEPAM 1 MG PO TABS
1.0000 mg | ORAL_TABLET | Freq: Three times a day (TID) | ORAL | Status: DC | PRN
  Administered 2014-10-30 – 2014-10-31 (×4): 1 mg via ORAL
  Filled 2014-10-28 (×4): qty 1

## 2014-10-28 NOTE — ED Notes (Signed)
Pt. [facility] by Vickey Huger, EMT. Pt is scheduled at 10:00 for her procreat injection.

## 2014-10-28 NOTE — ED Provider Notes (Signed)
00:45 AM: Hand-off report received from [employer], PA-C.  Awaiting Troponin to medically clear. She is currently resting without distress.   01:03 am: Troponin negative, pt medically cleared.  Pt's RN made aware.  Britt Bottom, NP 10/28/14 [id number]  Everlene Balls, MD 10/29/14 1430

## 2014-10-28 NOTE — ED Notes (Signed)
Pt. Ate 100% of breakfast.  Updated pt. On plan of care.  Pt. To [facility] to get her Procreat injection.  She verbalizes understanding and also explained to her that she will be accompanied by [employer].

## 2014-10-28 NOTE — [facility] Counselor (Signed)
Inpt recommended by Arlester Marker, NP. No appropriate ([address] hall) [facility] beds per Adult Unit.   Pt has a lot of medical issues and needs assistance with ADL's, so TTS will seek appropriate placement.   Ramond Dial, [facility] Triage Specialist
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
## 2014-10-28 NOTE — Progress Notes (Signed)
hgb 6.6 [facility] closed today, answering service states they will page Dr. Justin Mend

## 2014-10-28 NOTE — ED Notes (Signed)
Reported to Dr. Wyvonnia Dusky that pt.s HGB was 6.6 when she went to get her injection this am per note.  Short Stay RN was paging Dr. Justin Mend.

## 2014-10-28 NOTE — Progress Notes (Signed)
CSW sought placement at the following facilities: [facility], fax referral for med psych unit [place], fax referral [place], fax referral [facility], fax referral [place], fax referral (waitlist) [facility]- fax referral [facility], fax referral  [facility] [place]- no adult beds [place]- left [facility], at [place], MSW, [place] Work, Disposition Office 10/28/2014

## 2014-10-28 NOTE — ED Notes (Signed)
Spoke to dr rancour about patients appointment to get her procrit injection. He advises pt may go to short stay and get her injection as long as she is escourted by ed staff

## 2014-10-29 DIAGNOSIS — R45851 Suicidal ideations: Secondary | ICD-10-CM | POA: Diagnosis not present

## 2014-10-29 MED ORDER — INSULIN ASPART 100 UNIT/ML ~~LOC~~ SOLN
4.0000 [IU] | Freq: Three times a day (TID) | SUBCUTANEOUS | Status: DC
  Administered 2014-10-29 – 2014-11-01 (×8): 4 [IU] via SUBCUTANEOUS
  Filled 2014-10-29 (×7): qty 1

## 2014-10-29 MED ORDER — INSULIN ASPART 100 UNIT/ML ~~LOC~~ SOLN
0.0000 [IU] | Freq: Three times a day (TID) | SUBCUTANEOUS | Status: DC
  Administered 2014-10-29: 15 [IU] via SUBCUTANEOUS
  Administered 2014-10-30: 8 [IU] via SUBCUTANEOUS
  Administered 2014-10-30: 5 [IU] via SUBCUTANEOUS
  Administered 2014-10-30: 8 [IU] via SUBCUTANEOUS
  Administered 2014-10-31: 11 [IU] via SUBCUTANEOUS
  Administered 2014-10-31 – 2014-11-01 (×3): 15 [IU] via SUBCUTANEOUS
  Filled 2014-10-29 (×7): qty 1

## 2014-10-29 MED ORDER — INSULIN ASPART 100 UNIT/ML ~~LOC~~ SOLN: 2.0000 [IU] | Freq: Three times a day (TID) | SUBCUTANEOUS | Status: DC

## 2014-10-29 NOTE — ED Notes (Signed)
Per [place] PA, pt CBG is trending down and pt will not receive anymore insulin tonight.

## 2014-10-29 NOTE — ED Notes (Signed)
PT REQUESTED A SNACK OF SUGAR FREE COOKIES AND DIET GINGER ALE AND WAS GIVEN THE SAME OK BY RN

## 2014-10-29 NOTE — ED Notes (Signed)
CHECKED CBG 264

## 2014-10-29 NOTE — ED Notes (Signed)
CBG checked 375

## 2014-10-29 NOTE — ED Notes (Signed)
CBG 495. 

## 2014-10-29 NOTE — ED Notes (Signed)
Per [place] PA, will recheck CBG in another hour to ensure it is continuing to trend down.

## 2014-10-29 NOTE — Progress Notes (Signed)
CSW continued inpatient placement for patient.  CSW also followed up on previous referrals and obtained denials from previous referrals.   Faxed To:  Rosana Hoes: Wynona Dove: Janeann Merl: Jamesetta Geralds: Have not reviewed  Left Message: [place] (No Answer)  Denied at Previous Referrals: Cristal Ford (due to no insurance) Boykin Nearing (due to age requirement 38)  At capacity: [place]  [place], [place] Disposition Social Worker [phone]

## 2014-10-30 DIAGNOSIS — F1910 Other psychoactive substance abuse, uncomplicated: Secondary | ICD-10-CM

## 2014-10-30 DIAGNOSIS — R45851 Suicidal ideations: Secondary | ICD-10-CM | POA: Diagnosis not present

## 2014-10-30 DIAGNOSIS — F259 Schizoaffective disorder, unspecified: Secondary | ICD-10-CM | POA: Diagnosis not present

## 2014-10-30 DIAGNOSIS — F29 Unspecified psychosis not due to a substance or known physiological condition: Secondary | ICD-10-CM | POA: Insufficient documentation

## 2014-10-30 LAB — CBG MONITORING, ED
GLUCOSE-CAPILLARY: 553 mg/dL — AB (ref 70–99)
GLUCOSE-CAPILLARY: 375 mg/dL — AB (ref 70–99)
GLUCOSE-CAPILLARY: 264 mg/dL — AB (ref 70–99)
GLUCOSE-CAPILLARY: 245 mg/dL — AB (ref 70–99)
GLUCOSE-CAPILLARY: 273 mg/dL — AB (ref 70–99)
Glucose-Capillary: 495 mg/dL — ABNORMAL HIGH (ref 70–99)
Glucose-Capillary: 520 mg/dL — ABNORMAL HIGH (ref 70–99)

## 2014-10-30 LAB — GLUCOSE, CAPILLARY: GLUCOSE-CAPILLARY: 292 mg/dL — AB (ref 70–99)

## 2014-10-30 MED ORDER — FUROSEMIDE 20 MG PO TABS
80.0000 mg | ORAL_TABLET | Freq: Two times a day (BID) | ORAL | Status: DC
  Administered 2014-10-30 – 2014-11-01 (×5): 80 mg via ORAL
  Filled 2014-10-30 (×5): qty 4

## 2014-10-30 MED ORDER — FOLIC ACID 0.5 MG HALF TAB
400.0000 ug | ORAL_TABLET | Freq: Every morning | ORAL | Status: DC
Start: 2014-10-30 — End: 2014-11-01
  Administered 2014-10-30 – 2014-11-01 (×3): 0.5 mg via ORAL
  Filled 2014-10-30 (×3): qty 1

## 2014-10-30 MED ORDER — ADULT MULTIVITAMIN W/MINERALS CH
1.0000 | ORAL_TABLET | Freq: Every day | ORAL | Status: DC
  Administered 2014-10-30 – 2014-11-01 (×3): 1 via ORAL
  Filled 2014-10-30 (×3): qty 1

## 2014-10-30 MED ORDER — LEVETIRACETAM 500 MG PO TABS
1000.0000 mg | ORAL_TABLET | Freq: Every day | ORAL | Status: DC
  Administered 2014-10-30 – 2014-10-31 (×2): 1000 mg via ORAL
  Filled 2014-10-30 (×3): qty 2

## 2014-10-30 MED ORDER — POLYETHYLENE GLYCOL 3350 17 G PO PACK
17.0000 g | PACK | Freq: Two times a day (BID) | ORAL | Status: DC
  Administered 2014-10-30 – 2014-11-01 (×5): 17 g via ORAL
  Filled 2014-10-30 (×6): qty 1

## 2014-10-30 MED ORDER — ZOLPIDEM TARTRATE 5 MG PO TABS: 5.0000 mg | ORAL_TABLET | Freq: Every evening | ORAL | Status: DC | PRN

## 2014-10-30 NOTE — ED Notes (Signed)
CBG - [phone] 
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
## 2014-10-30 NOTE — ED Notes (Signed)
CBG 292 

## 2014-10-30 NOTE — ED Notes (Addendum)
CBG is 245.

## 2014-10-30 NOTE — ED Notes (Signed)
PT REQUESTING TO HAVE MEDS CHANGED D/T THE ONES SHE IS ON ARE NOT HELPING.

## 2014-10-30 NOTE — BHH Counselor (Signed)
Faxed out to the following facilities with open beds: [facility]  [place], M.S., [facility], [place], [facility] Licensed Professional Counselor Associate  Triage Specialist  [facility]  Therapeutic Triage Services Phone: [phone] Fax: [phone]

## 2014-10-31 DIAGNOSIS — R45851 Suicidal ideations: Secondary | ICD-10-CM | POA: Diagnosis not present

## 2014-10-31 DIAGNOSIS — F1910 Other psychoactive substance abuse, uncomplicated: Secondary | ICD-10-CM | POA: Diagnosis not present

## 2014-10-31 DIAGNOSIS — F259 Schizoaffective disorder, unspecified: Secondary | ICD-10-CM | POA: Diagnosis not present

## 2014-10-31 LAB — CBG MONITORING, ED
GLUCOSE-CAPILLARY: 407 mg/dL — AB (ref 70–99)
GLUCOSE-CAPILLARY: 339 mg/dL — AB (ref 70–99)
Glucose-Capillary: 395 mg/dL — ABNORMAL HIGH (ref 70–99)
Glucose-Capillary: 253 mg/dL — ABNORMAL HIGH (ref 70–99)
Glucose-Capillary: 307 mg/dL — ABNORMAL HIGH (ref 70–99)

## 2014-10-31 LAB — I-STAT CHEM 8, ED
BUN: 64 mg/dL — ABNORMAL HIGH (ref 6–23)
Calcium, Ion: 1.18 mmol/L (ref 1.12–1.23)
Chloride: 100 mmol/L (ref 96–112)
Creatinine, Ser: 4.8 mg/dL — ABNORMAL HIGH (ref 0.50–1.10)
Glucose, Bld: 426 mg/dL — ABNORMAL HIGH (ref 70–99)
HCT: 23 % — ABNORMAL LOW (ref 36.0–46.0)
Hemoglobin: 7.8 g/dL — ABNORMAL LOW (ref 12.0–15.0)
Potassium: 4.2 mmol/L (ref 3.5–5.1)
Sodium: 133 mmol/L — ABNORMAL LOW (ref 135–145)
TCO2: 17 mmol/L (ref 0–100)

## 2014-10-31 MED ORDER — INSULIN GLARGINE 100 UNIT/ML ~~LOC~~ SOLN
30.0000 [IU] | Freq: Every day | SUBCUTANEOUS | Status: DC
  Administered 2014-10-31: 30 [IU] via SUBCUTANEOUS
  Filled 2014-10-31 (×3): qty 0.3

## 2014-10-31 NOTE — ED Notes (Addendum)
Dr Rogene Houston aware of pt blood sugar 407

## 2014-10-31 NOTE — Progress Notes (Addendum)
LCSW reviewed patient's tele-assessment regarding disposition. Patient is eligible for a taxi, however due to late time in the day, patient will most likely not be seen through open access as their hours are (8am-3pm).  Patient has no way into her home per report. Reports her son is not home and he is working. Patient reporting she is going to sue [facility] as they are not helping her.  Patient is too medically acute at this time to be accepted to [facility].  There have been no other bed offers per review of chart. Patient to be discharged home with outpatient services.  Patient reports she has a PCP, but is not taking her psych meds and has not for the last 3 months.  Patient is not wanting to DC home at this time. Reports she is not safe and does not have a way into her home. Asked patient to call son, in which she reports she will, but she wants to stay here another night.    LCSW is inquiring about a [facility] referral to get patient community support for medical problems.  LCSW called with referral information to [place] in effort to see if patient meets criteria.  Patient is being reviewed. Patient can follow up with [facility] on Tuesday March 29th at 8am for open access and to be medially/psychiatrically evaluated for outpatient services.   Patient is agreeable to ACT services and LCSW made referral to [employer] who will evaluate her on 11/01/14 at 8:00am prior to when she leaves.  Patient will remain in ED overnight due to unsafe DC plan and no one at home.    [place] reports she and her team and worked for the last month and February to contact patient and her son to assess for community support.  They have been unable to reach patient on [phone].    Updated numbers given to LCSW:   [phone]      [facility] Son [phone]    Lane Hacker, MSW Clinical Social Work: Emergency Room [phone]

## 2014-10-31 NOTE — ED Notes (Signed)
PER CONRAD AT [place] PT CAN BE DISCHARGED TO [facility]. DUE TO TIME OF DAY PATIENT WOULD NOT BE SEEN TODAY AT [facility]. PT ALSO REPORTS SHE IS  UNABLE TO GET INTO HER HOME DUE TO HER SON BEING AT WORK. DISCUSSED WITH CHARGE NURSE AND WILL HOLD PT TILL MORNING AND WILL DISCHARGE HER BY CAB TO GO TO [facility] IN THE MORNING. DISCUSSED PLAN WITH PATIENT AND SHE IS AGREEABLE

## 2014-10-31 NOTE — ED Notes (Signed)
PATIENT HAS COMPLETED HER TELEPSYCH ASSESSMENT
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
## 2014-11-01 DIAGNOSIS — R45851 Suicidal ideations: Secondary | ICD-10-CM | POA: Diagnosis not present

## 2014-11-01 LAB — CBG MONITORING, ED: GLUCOSE-CAPILLARY: 422 mg/dL — AB (ref 70–99)

## 2014-11-01 NOTE — Progress Notes (Signed)
[facility] completed initial assessment and obtained consents for patient and will begin services on November 02, 2014 at 11:00am at [facility] home.  Gave representative new contact information and phone numbers via facesheet.  No other needs at this time. Patient to be discharged home this morning.  Lane Hacker, MSW Clinical Social Work: Emergency Room [phone]

## 2014-11-01 NOTE — Discharge Instructions (Signed)
Suicidal Feelings, How to Help Yourself °Everyone feels sad or unhappy at times, but depressing thoughts and feelings of hopelessness can lead to thoughts of suicide. It can seem as if life is too tough to handle. If you feel as though you have reached the point where suicide is the only answer, it is time to let someone know immediately.  °HOW TO COPE AND PREVENT SUICIDE °· Let family, friends, teachers, or counselors know. Get help. Try not to isolate yourself from those who care about you. Even though you may not feel sociable, talk with someone every day. It is best if it is face-to-face. Remember, they will want to help you. °· Eat a regularly spaced and well-balanced diet. °· Get plenty of rest. °· Avoid alcohol and drugs because they will only make you feel worse and may also lower your inhibitions. Remove them from the home. If you are thinking of taking an overdose of your prescribed medicines, give your medicines to someone who can give them to you one day at a time. If you are on antidepressants, let your caregiver know of your feelings so he or she can provide a safer medicine, if that is a concern. °· Remove weapons or poisons from your home. °· Try to stick to routines. Follow a schedule and remind yourself that you have to keep that schedule every day. °· Set some realistic goals and achieve them. Make a list and cross things off as you go. Accomplishments give a sense of worth. Wait until you are feeling better before doing things you find difficult or unpleasant to do. °· If you are able, try to start exercising. Even half-hour periods of exercise each day will make you feel better. Getting out in the sun or into nature helps you recover from depression faster. If you have a favorite place to walk, take advantage of that. °· Increase safe activities that have always given you pleasure. This may include playing your favorite music, reading a good book, painting a picture, or playing your favorite  instrument. Do whatever takes your mind off your depression. °· Keep your living space well-lighted. °GET HELP °Contact a suicide hotline, crisis center, or local suicide prevention center for help right away. Local centers may include a hospital, clinic, community service organization, social service provider, or health department. °· Call your local emergency services (911 in the [place]). °· Call a suicide hotline: °¨ 1-800-273-TALK (1-800-273-8255) in the [place]. °¨ 1-800-SUICIDE (1-800-784-2433) in the [place]. °¨ 1-888-628-9454 in the [place] for Spanish-speaking counselors. °¨ 1-800-799-4TTY (1-800-799-4889) in the [place] for TTY users. °· Visit the following websites for information and help: °¨ National Suicide Prevention Lifeline: www.suicidepreventionlifeline.org °¨ Hopeline: www.hopeline.com °¨ American Foundation for Suicide Prevention: www.afsp.org °· For lesbian, gay, bisexual, transgender, or questioning youth, contact The [employer]: °¨ 1-866-4-U-TREVOR (1-866-488-7386) in the [place]. °¨ www.thetrevorproject.org °· In [place], treatment resources are listed in each province with listings available under [employer] or similar titles. Another source for Crisis Centres by Province is located at http://www.suicideprevention.ca/in-crisis-now/find-a-crisis-centre-now/crisis-centres °Document Released: 01/26/2003 Document Revised: 10/14/2011 Document Reviewed: 11/16/2013 °ExitCare® Patient Information ©2015 [employer]. This information is not intended to replace advice given to you by your health care provider. Make sure you discuss any questions you have with your health care provider. ° °

## 2014-11-01 NOTE — ED Notes (Signed)
[facility] representative here to meet and assess patient

## 2014-11-01 NOTE — ED Notes (Signed)
Pt requests to rest till 10 am before she is discharged.

## 2014-11-03 ENCOUNTER — Other Ambulatory Visit (HOSPITAL_COMMUNITY): Payer: Self-pay | Admitting: *Deleted

## 2014-11-04 ENCOUNTER — Encounter (HOSPITAL_COMMUNITY): Payer: Self-pay

## 2014-11-08 ENCOUNTER — Encounter (HOSPITAL_COMMUNITY): Payer: Self-pay

## 2014-11-08 ENCOUNTER — Inpatient Hospital Stay (HOSPITAL_COMMUNITY)
Admission: EM | Admit: 2014-11-08 | Discharge: 2014-11-11 | DRG: 638 | Disposition: A | Discharge status: Home / Self Care | Payer: Medicare Other | Attending: Internal Medicine | Admitting: Internal Medicine

## 2014-11-08 DIAGNOSIS — N184 Chronic kidney disease, stage 4 (severe): Secondary | ICD-10-CM | POA: Diagnosis present

## 2014-11-08 DIAGNOSIS — F419 Anxiety disorder, unspecified: Secondary | ICD-10-CM | POA: Diagnosis present

## 2014-11-08 DIAGNOSIS — K5900 Constipation, unspecified: Secondary | ICD-10-CM | POA: Diagnosis present

## 2014-11-08 DIAGNOSIS — N179 Acute kidney failure, unspecified: Secondary | ICD-10-CM

## 2014-11-08 DIAGNOSIS — I129 Hypertensive chronic kidney disease with stage 1 through stage 4 chronic kidney disease, or unspecified chronic kidney disease: Secondary | ICD-10-CM | POA: Diagnosis present

## 2014-11-08 DIAGNOSIS — E1165 Type 2 diabetes mellitus with hyperglycemia: Secondary | ICD-10-CM | POA: Diagnosis present

## 2014-11-08 DIAGNOSIS — Z9119 Patient's noncompliance with other medical treatment and regimen: Secondary | ICD-10-CM | POA: Diagnosis present

## 2014-11-08 DIAGNOSIS — Z90710 Acquired absence of both cervix and uterus: Secondary | ICD-10-CM

## 2014-11-08 DIAGNOSIS — N185 Chronic kidney disease, stage 5: Secondary | ICD-10-CM | POA: Diagnosis present

## 2014-11-08 DIAGNOSIS — E1143 Type 2 diabetes mellitus with diabetic autonomic (poly)neuropathy: Secondary | ICD-10-CM | POA: Diagnosis present

## 2014-11-08 DIAGNOSIS — E860 Dehydration: Secondary | ICD-10-CM | POA: Diagnosis present

## 2014-11-08 DIAGNOSIS — E1140 Type 2 diabetes mellitus with diabetic neuropathy, unspecified: Secondary | ICD-10-CM | POA: Diagnosis present

## 2014-11-08 DIAGNOSIS — G40909 Epilepsy, unspecified, not intractable, without status epilepticus: Secondary | ICD-10-CM

## 2014-11-08 DIAGNOSIS — R778 Other specified abnormalities of plasma proteins: Secondary | ICD-10-CM | POA: Diagnosis present

## 2014-11-08 DIAGNOSIS — I5042 Chronic combined systolic (congestive) and diastolic (congestive) heart failure: Secondary | ICD-10-CM | POA: Diagnosis present

## 2014-11-08 DIAGNOSIS — G894 Chronic pain syndrome: Secondary | ICD-10-CM | POA: Diagnosis present

## 2014-11-08 DIAGNOSIS — Z888 Allergy status to other drugs, medicaments and biological substances status: Secondary | ICD-10-CM

## 2014-11-08 DIAGNOSIS — R739 Hyperglycemia, unspecified: Secondary | ICD-10-CM

## 2014-11-08 DIAGNOSIS — F17210 Nicotine dependence, cigarettes, uncomplicated: Secondary | ICD-10-CM | POA: Diagnosis present

## 2014-11-08 DIAGNOSIS — I10 Essential (primary) hypertension: Secondary | ICD-10-CM

## 2014-11-08 DIAGNOSIS — Z91199 Patient's noncompliance with other medical treatment and regimen due to unspecified reason: Secondary | ICD-10-CM

## 2014-11-08 DIAGNOSIS — G629 Polyneuropathy, unspecified: Secondary | ICD-10-CM | POA: Diagnosis present

## 2014-11-08 DIAGNOSIS — E1100 Type 2 diabetes mellitus with hyperosmolarity without nonketotic hyperglycemic-hyperosmolar coma (NKHHC): Secondary | ICD-10-CM | POA: Diagnosis present

## 2014-11-08 DIAGNOSIS — B1920 Unspecified viral hepatitis C without hepatic coma: Secondary | ICD-10-CM | POA: Diagnosis present

## 2014-11-08 DIAGNOSIS — K219 Gastro-esophageal reflux disease without esophagitis: Secondary | ICD-10-CM | POA: Diagnosis present

## 2014-11-08 DIAGNOSIS — Z9114 Patient's other noncompliance with medication regimen: Secondary | ICD-10-CM | POA: Diagnosis present

## 2014-11-08 DIAGNOSIS — R42 Dizziness and giddiness: Secondary | ICD-10-CM

## 2014-11-08 DIAGNOSIS — Z794 Long term (current) use of insulin: Secondary | ICD-10-CM

## 2014-11-08 DIAGNOSIS — R112 Nausea with vomiting, unspecified: Secondary | ICD-10-CM | POA: Diagnosis present

## 2014-11-08 DIAGNOSIS — Z79891 Long term (current) use of opiate analgesic: Secondary | ICD-10-CM

## 2014-11-08 DIAGNOSIS — B182 Chronic viral hepatitis C: Secondary | ICD-10-CM | POA: Diagnosis present

## 2014-11-08 DIAGNOSIS — Z8673 Personal history of transient ischemic attack (TIA), and cerebral infarction without residual deficits: Secondary | ICD-10-CM

## 2014-11-08 DIAGNOSIS — R29898 Other symptoms and signs involving the musculoskeletal system: Secondary | ICD-10-CM

## 2014-11-08 DIAGNOSIS — Z89432 Acquired absence of left foot: Secondary | ICD-10-CM

## 2014-11-08 DIAGNOSIS — R7989 Other specified abnormal findings of blood chemistry: Secondary | ICD-10-CM

## 2014-11-08 DIAGNOSIS — F209 Schizophrenia, unspecified: Secondary | ICD-10-CM | POA: Diagnosis present

## 2014-11-08 DIAGNOSIS — E1122 Type 2 diabetes mellitus with diabetic chronic kidney disease: Secondary | ICD-10-CM | POA: Diagnosis present

## 2014-11-08 DIAGNOSIS — B349 Viral infection, unspecified: Secondary | ICD-10-CM | POA: Diagnosis present

## 2014-11-08 DIAGNOSIS — D631 Anemia in chronic kidney disease: Secondary | ICD-10-CM | POA: Diagnosis present

## 2014-11-08 DIAGNOSIS — Z880 Allergy status to penicillin: Secondary | ICD-10-CM

## 2014-11-08 DIAGNOSIS — F319 Bipolar disorder, unspecified: Secondary | ICD-10-CM | POA: Diagnosis present

## 2014-11-08 DIAGNOSIS — E780 Pure hypercholesterolemia: Secondary | ICD-10-CM | POA: Diagnosis present

## 2014-11-08 DIAGNOSIS — K3184 Gastroparesis: Secondary | ICD-10-CM | POA: Diagnosis present

## 2014-11-08 LAB — COMPREHENSIVE METABOLIC PANEL
ALK PHOS: 167 U/L — AB (ref 39–117)
ALT: 53 U/L — AB (ref 0–35)
AST: 40 U/L — AB (ref 0–37)
Albumin: 2.6 g/dL — ABNORMAL LOW (ref 3.5–5.2)
Anion gap: 15 (ref 5–15)
BILIRUBIN TOTAL: 0.5 mg/dL (ref 0.3–1.2)
BUN: 82 mg/dL — ABNORMAL HIGH (ref 6–23)
CHLORIDE: 85 mmol/L — AB (ref 96–112)
CO2: 20 mmol/L (ref 19–32)
Calcium: 8.8 mg/dL (ref 8.4–10.5)
Creatinine, Ser: 5.64 mg/dL — ABNORMAL HIGH (ref 0.50–1.10)
GFR calc Af Amer: 9 mL/min — ABNORMAL LOW (ref >90)
GFR, EST NON AFRICAN AMERICAN: 8 mL/min — AB (ref >90)
Glucose, Bld: 1048 mg/dL (ref 70–99)
Potassium: 4.4 mmol/L (ref 3.5–5.1)
Sodium: 120 mmol/L — ABNORMAL LOW (ref 135–145)
Total Protein: 6.9 g/dL (ref 6.0–8.3)

## 2014-11-08 LAB — CBC WITH DIFFERENTIAL/PLATELET
BASOS ABS: 0 10*3/uL (ref 0.0–0.1)
Basophils Relative: 1 % (ref 0–1)
Eosinophils Absolute: 0 10*3/uL (ref 0.0–0.7)
Eosinophils Relative: 1 % (ref 0–5)
HEMATOCRIT: 23.9 % — AB (ref 36.0–46.0)
HEMOGLOBIN: 7.4 g/dL — AB (ref 12.0–15.0)
LYMPHS PCT: 20 % (ref 12–46)
Lymphs Abs: 0.7 10*3/uL (ref 0.7–4.0)
MCH: 27.8 pg (ref 26.0–34.0)
MCHC: 31 g/dL (ref 30.0–36.0)
MCV: 89.8 fL (ref 78.0–100.0)
Monocytes Absolute: 0.2 10*3/uL (ref 0.1–1.0)
Monocytes Relative: 4 % (ref 3–12)
Neutro Abs: 2.5 10*3/uL (ref 1.7–7.7)
Neutrophils Relative %: 74 % (ref 43–77)
PLATELETS: 206 10*3/uL (ref 150–400)
RBC: 2.66 MIL/uL — AB (ref 3.87–5.11)
RDW: 14.8 % (ref 11.5–15.5)
WBC: 3.4 10*3/uL — ABNORMAL LOW (ref 4.0–10.5)

## 2014-11-08 LAB — LIPASE, BLOOD: Lipase: 45 U/L (ref 11–59)

## 2014-11-08 LAB — TROPONIN I: TROPONIN I: 0.04 ng/mL — AB (ref <0.031)

## 2014-11-08 MED ORDER — SODIUM CHLORIDE 0.9 % IV BOLUS (SEPSIS)
500.0000 mL | Freq: Once | INTRAVENOUS | Status: AC
  Administered 2014-11-08: 500 mL via INTRAVENOUS

## 2014-11-08 MED ORDER — LABETALOL HCL 5 MG/ML IV SOLN
20.0000 mg | Freq: Once | INTRAVENOUS | Status: AC
  Administered 2014-11-08: 20 mg via INTRAVENOUS
  Filled 2014-11-08: qty 4

## 2014-11-08 NOTE — ED Notes (Addendum)
Pt reports numerous complaints.  Onset 2 days vomiting x 4 today; unable to sleep; dizziness x 4 days; pain to left foot, pain to right arm- broke x 6 months.  Pt needs refill on seizure medications

## 2014-11-08 NOTE — ED Provider Notes (Signed)
CSN: [id number]     Arrival date & time 11/08/14  1900 History   First MD Initiated Contact with Patient 11/08/14 2109     Chief Complaint  Patient presents with  . Emesis  . Medication Refill  . Dizziness     (Consider location/radiation/quality/duration/timing/severity/associated sxs/prior Treatment) HPI Comments: Patient presents to the ER for evaluation of multiple problems. She reports that for the last 2 days she has had nausea and vomiting. She has vomited multiple times today. She has been experiencing severe dizziness for 4 days. She reports that she has been passing out. She is not experiencing chest pain or shortness of breath. There is no significant abdominal pain associated with nausea and vomiting. She reports that she has run out of her seizure medication, but does not think that the passing out spells or seizures.  Patient is a 49 y.o. female presenting with vomiting and dizziness.  Emesis Dizziness Associated symptoms: nausea and vomiting     Past Medical History  Diagnosis Date  . Neuropathy   . Glaucoma   . Bipolar 1 disorder   . Refusal of blood transfusions as patient is Jehovah's Witness   . Shortness of breath   . TIA (transient ischemic attack) 2010  . Neuromuscular disorder     DIABETIC NEUROPATHY  . MRSA infection ?2014    "on the back of my head; spread throughout my bloodstream"  . Bipolar disorder, unspecified 02/06/2014  . High cholesterol   . Type II diabetes mellitus     INSULIN DEPENDENT  . Hepatitis C   . Chronic pain syndrome   . Chronic kidney disease (CKD), stage III (moderate)   . Schizoaffective disorder, unspecified condition 02/08/2014  . Seizure dx'd 2014    "don't know what kind; last one was ~ 04/2014"  . Chronic combined systolic and diastolic CHF (congestive heart failure)     EF 40% by echo and 48% by stress test  . Anemia   . Hypertension   . Stroke     hx TIA  . Neuromuscular disorder     diabetic neuropathy  . Shortness of  breath dyspnea   . Schizophrenia   . Seizures   . Hepatitis     hep C  . Bipolar disorder   . Glaucoma   . Chronic pain   . Diabetes mellitus without complication     on Insulin  . Depression   . Anxiety   . Chronic kidney disease     chronic kidney stage 3 Dr Mercy Moore  . GERD (gastroesophageal reflux disease)   . History of hiatal hernia   . Headache     sinus headaches   Past Surgical History  Procedure Laterality Date  . Amputation Left 05/21/2013    Procedure: Left Midfoot AMPUTATION;  Surgeon: Newt Minion, MD;  Location: [facility];  Service: Orthopedics;  Laterality: Left;  Left Midfoot Amputation  . Tubal ligation    . Colonoscopy with propofol N/A 06/22/2013    Procedure: COLONOSCOPY WITH PROPOFOL;  Surgeon: Jeryl Columbia, MD;  Location: [facility];  Service: Endoscopy;  Laterality: N/A;  . Abdominal hysterectomy  2014  . Tonsillectomy  1970's  . Refractive surgery Bilateral 2013  . Tubal ligation  2000  . Abdominal hysterectomy  2010  . Foot amputation Left     due diabetic neuropathy, could not feel ulcer on bottom of foot  . Eye surgery Bilateral 2010    Lasik   Family History  Problem Relation  Age of Onset  . Hypertension Mother   . Hypertension Father    History  Substance Use Topics  . Smoking status: Current Some Day Smoker -- 1.00 packs/day for 20 years    Types: Cigarettes    Last Attempt to Quit: 10/01/2011  . Smokeless tobacco: Never Used     Comment: "stopped smoking in ~ 2013; restarted last week; stopped again 06/17/2014"  . Alcohol Use: Yes     Comment: 3 - 40's q week   OB History    Gravida Para Term Preterm AB TAB SAB Ectopic Multiple Living   0 0 0 0 0 0 0 0       Review of Systems  Constitutional: Positive for fatigue.  Gastrointestinal: Positive for nausea and vomiting.  Neurological: Positive for dizziness and syncope.  All other systems reviewed and are negative.     Allergies  Gabapentin; Gabapentin; Lyrica; Penicillins;  Saphris; Saphris; Tramadol; Tramadol; and Penicillins  Home Medications   Prior to Admission medications   Medication Sig Start Date End Date Taking? Authorizing Provider  acetaminophen (TYLENOL) 500 MG tablet Take 500 mg by mouth every 6 (six) hours as needed for mild pain or moderate pain.   Yes Historical Provider, MD  amLODipine (NORVASC) 5 MG tablet Take 5 mg by mouth daily.  09/15/14  Yes Historical Provider, MD  aspirin EC 325 MG EC tablet Take 1 tablet (325 mg total) by mouth daily. 08/27/14  Yes Geradine Girt, DO  carvedilol (COREG) 25 MG tablet Take 1 tablet (25 mg total) by mouth 2 (two) times daily. 06/24/14  Yes Debbe Odea, MD  cloNIDine (CATAPRES) 0.2 MG tablet Take 0.2 mg by mouth at bedtime.  08/28/14  Yes Historical Provider, MD  Fe Bisgly-Vit C-Vit B12-FA 28-60-0.008-0.4 MG CAPS Take 1 tablet by mouth daily.   Yes Historical Provider, MD  ferrous sulfate 325 (65 FE) MG tablet Take 1 tablet (325 mg total) by mouth daily with breakfast. 06/24/14  Yes Debbe Odea, MD  folic acid (FOLVITE) A999333 MCG tablet Take 200 mcg by mouth every morning.    Yes Historical Provider, MD  furosemide (LASIX) 40 MG tablet Take 2 tablets (80 mg total) by mouth 2 (two) times daily. 08/09/14  Yes Larene Pickett, PA-C  insulin aspart (NOVOLOG) 100 UNIT/ML injection Inject 2-10 Units into the skin 3 (three) times daily before meals. SSI: 0-200 = 2 units, 201-250 = 4 units, 251-300 = 6 units, 301-350 = 8 units, 351-400 = 10 units   Yes Historical Provider, MD  insulin glargine (LANTUS) 100 UNIT/ML injection Inject 0.3 mLs (30 Units total) into the skin daily. Patient taking differently: Inject 25 Units into the skin at bedtime.  08/27/14  Yes Geradine Girt, DO  Lactobacillus (ACIDOPHILUS) 100 MG CAPS Take 100 mg by mouth daily.   Yes Historical Provider, MD  levETIRAcetam (KEPPRA) 500 MG tablet Take 1,000 mg by mouth at bedtime.  06/25/14  Yes Historical Provider, MD  Multiple Vitamin (MULTIVITAMIN WITH  MINERALS) TABS tablet Take 1 tablet by mouth daily.   Yes Historical Provider, MD  mupirocin cream (BACTROBAN) 2 % Apply topically daily. Patient taking differently: Apply 1 application topically daily as needed (itching, skin irritation).  08/27/14  Yes Geradine Girt, DO  polyethylene glycol (MIRALAX / GLYCOLAX) packet Take 17 g by mouth 2 (two) times daily. Mix in 8 oz liquid and drink 08/24/14  Yes Historical Provider, MD  zolpidem (AMBIEN) 5 MG tablet Take 5 mg by  mouth at bedtime.  09/19/14  Yes Historical Provider, MD  cephALEXin (KEFLEX) 500 MG capsule Take 1 capsule (500 mg total) by mouth every 12 (twelve) hours. Patient not taking: Reported on 11/08/2014 08/27/14   Geradine Girt, DO  oxyCODONE-acetaminophen (PERCOCET/ROXICET) 5-325 MG per tablet Take 1 tablet by mouth every 6 (six) hours as needed for moderate pain. Patient not taking: Reported on 11/09/2014 08/27/14   Tomi Bamberger Vann, DO   BP 193/109 mmHg  Pulse 102  Temp(Src) 97.9 F (36.6 C) (Oral)  Resp 16  Ht 5\' 4"  (1.626 m)  Wt 172 lb 9.6 oz (78.291 kg)  BMI 29.61 kg/m2  SpO2 100% Physical Exam  Constitutional: She is oriented to person, place, and time. She appears well-developed and well-nourished. No distress.  HENT:  Head: Normocephalic and atraumatic.  Right Ear: Hearing normal.  Left Ear: Hearing normal.  Nose: Nose normal.  Mouth/Throat: Oropharynx is clear and moist and mucous membranes are normal.  Eyes: Conjunctivae and EOM are normal. Pupils are equal, round, and reactive to light.  Neck: Normal range of motion. Neck supple.  Cardiovascular: Regular rhythm, S1 normal and S2 normal.  Exam reveals no gallop and no friction rub.   No murmur heard. Pulmonary/Chest: Effort normal and breath sounds normal. No respiratory distress. She exhibits no tenderness.  Abdominal: Soft. Normal appearance and bowel sounds are normal. There is no hepatosplenomegaly. There is no tenderness. There is no rebound, no guarding, no  tenderness at McBurney's point and negative Murphy's sign. No hernia.  Musculoskeletal: Normal range of motion.  Neurological: She is alert and oriented to person, place, and time. She has normal strength. No cranial nerve deficit or sensory deficit. Coordination normal. GCS eye subscore is 4. GCS verbal subscore is 5. GCS motor subscore is 6.  Skin: Skin is warm, dry and intact. No rash noted. No cyanosis.  Psychiatric: She has a normal mood and affect. Her speech is normal and behavior is normal. Thought content normal.  Nursing note and vitals reviewed.   ED Course  Procedures (including critical care time) Labs Review Labs Reviewed  CBC WITH DIFFERENTIAL/PLATELET - Abnormal; Notable for the following:    WBC 3.4 (*)    RBC 2.66 (*)    Hemoglobin 7.4 (*)    HCT 23.9 (*)    All other components within normal limits  COMPREHENSIVE METABOLIC PANEL - Abnormal; Notable for the following:    Sodium 120 (*)    Chloride 85 (*)    Glucose, Bld 1048 (*)    BUN 82 (*)    Creatinine, Ser 5.64 (*)    Albumin 2.6 (*)    AST 40 (*)    ALT 53 (*)    Alkaline Phosphatase 167 (*)    GFR calc non Af Amer 8 (*)    GFR calc Af Amer 9 (*)    All other components within normal limits  TROPONIN I - Abnormal; Notable for the following:    Troponin I 0.04 (*)    All other components within normal limits  LIPASE, BLOOD  URINALYSIS, ROUTINE W REFLEX MICROSCOPIC    Imaging Review No results found.   EKG Interpretation   Date/Time:  Tuesday November 08 2014 20:10:07 EDT Ventricular Rate:  91 PR Interval:  170 QRS Duration: 96 QT Interval:  368 QTC Calculation: 452 R Axis:   47 Text Interpretation:  Normal sinus rhythm ST \\T \ T wave abnormality,  consider inferior ischemia Abnormal ECG Confirmed by Raine Elsass  MD,  Diamon Reddinger [phone]) on 11/08/2014 9:26:41 PM      MDM   Final diagnoses:  Dizziness  Hyperglycemia  Essential hypertension    Patient presents to the ER for evaluation of  weakness, dizziness with nausea and vomiting. She has not experiencing abdominal pain. Abdominal exam is benign and nontender. Patient does have a history of diabetes. She has significant hyperglycemia. Blood sugar is over 1000. Patient has stage IV chronic kidney disease, cannot be administered significant amounts of IV fluids. She was given a small fluid bolus and will be placed" stabilizer. She will require hospitalization for further management.  She is complaining of dizziness. This is likely secondary to volume depletion from her significant hyperglycemia. CT scan of head was ordered, pending. She does not have any focal neurologic findings on examination.  Patient was found to be hypertensive here 2. She was administered labetalol for this. She has nonspecific T-wave abnormalities on EKG. Troponin was borderline elevated at 0.04. She'll need to cycle enzymes.  Orpah Greek, MD 11/09/14 [phone]

## 2014-11-08 NOTE — ED Notes (Signed)
Josh, EMT advised pt stopped him in waiting room c/o chest pan and then vomited.  Tech to get EKG.

## 2014-11-09 ENCOUNTER — Encounter (HOSPITAL_COMMUNITY): Payer: Self-pay

## 2014-11-09 ENCOUNTER — Inpatient Hospital Stay (HOSPITAL_COMMUNITY): Payer: Medicare Other

## 2014-11-09 DIAGNOSIS — Z794 Long term (current) use of insulin: Secondary | ICD-10-CM | POA: Diagnosis not present

## 2014-11-09 DIAGNOSIS — R112 Nausea with vomiting, unspecified: Secondary | ICD-10-CM

## 2014-11-09 DIAGNOSIS — K3184 Gastroparesis: Secondary | ICD-10-CM | POA: Diagnosis present

## 2014-11-09 DIAGNOSIS — G40909 Epilepsy, unspecified, not intractable, without status epilepticus: Secondary | ICD-10-CM | POA: Diagnosis present

## 2014-11-09 DIAGNOSIS — D631 Anemia in chronic kidney disease: Secondary | ICD-10-CM | POA: Diagnosis present

## 2014-11-09 DIAGNOSIS — E1143 Type 2 diabetes mellitus with diabetic autonomic (poly)neuropathy: Secondary | ICD-10-CM | POA: Diagnosis present

## 2014-11-09 DIAGNOSIS — E1100 Type 2 diabetes mellitus with hyperosmolarity without nonketotic hyperglycemic-hyperosmolar coma (NKHHC): Principal | ICD-10-CM

## 2014-11-09 DIAGNOSIS — R1110 Vomiting, unspecified: Secondary | ICD-10-CM

## 2014-11-09 DIAGNOSIS — B1920 Unspecified viral hepatitis C without hepatic coma: Secondary | ICD-10-CM | POA: Diagnosis present

## 2014-11-09 DIAGNOSIS — Z79891 Long term (current) use of opiate analgesic: Secondary | ICD-10-CM | POA: Diagnosis not present

## 2014-11-09 DIAGNOSIS — B182 Chronic viral hepatitis C: Secondary | ICD-10-CM | POA: Diagnosis present

## 2014-11-09 DIAGNOSIS — F319 Bipolar disorder, unspecified: Secondary | ICD-10-CM | POA: Diagnosis present

## 2014-11-09 DIAGNOSIS — F209 Schizophrenia, unspecified: Secondary | ICD-10-CM | POA: Diagnosis present

## 2014-11-09 DIAGNOSIS — R7989 Other specified abnormal findings of blood chemistry: Secondary | ICD-10-CM

## 2014-11-09 DIAGNOSIS — Z8673 Personal history of transient ischemic attack (TIA), and cerebral infarction without residual deficits: Secondary | ICD-10-CM | POA: Diagnosis not present

## 2014-11-09 DIAGNOSIS — Z90710 Acquired absence of both cervix and uterus: Secondary | ICD-10-CM | POA: Diagnosis not present

## 2014-11-09 DIAGNOSIS — I129 Hypertensive chronic kidney disease with stage 1 through stage 4 chronic kidney disease, or unspecified chronic kidney disease: Secondary | ICD-10-CM | POA: Diagnosis present

## 2014-11-09 DIAGNOSIS — I10 Essential (primary) hypertension: Secondary | ICD-10-CM | POA: Diagnosis not present

## 2014-11-09 DIAGNOSIS — K219 Gastro-esophageal reflux disease without esophagitis: Secondary | ICD-10-CM | POA: Diagnosis present

## 2014-11-09 DIAGNOSIS — K5900 Constipation, unspecified: Secondary | ICD-10-CM | POA: Diagnosis present

## 2014-11-09 DIAGNOSIS — Z89432 Acquired absence of left foot: Secondary | ICD-10-CM | POA: Diagnosis not present

## 2014-11-09 DIAGNOSIS — I5042 Chronic combined systolic (congestive) and diastolic (congestive) heart failure: Secondary | ICD-10-CM

## 2014-11-09 DIAGNOSIS — Z9119 Patient's noncompliance with other medical treatment and regimen: Secondary | ICD-10-CM | POA: Diagnosis present

## 2014-11-09 DIAGNOSIS — N184 Chronic kidney disease, stage 4 (severe): Secondary | ICD-10-CM | POA: Diagnosis present

## 2014-11-09 DIAGNOSIS — R42 Dizziness and giddiness: Secondary | ICD-10-CM

## 2014-11-09 DIAGNOSIS — E780 Pure hypercholesterolemia: Secondary | ICD-10-CM | POA: Diagnosis present

## 2014-11-09 DIAGNOSIS — Z9114 Patient's other noncompliance with medication regimen: Secondary | ICD-10-CM | POA: Diagnosis present

## 2014-11-09 DIAGNOSIS — E1122 Type 2 diabetes mellitus with diabetic chronic kidney disease: Secondary | ICD-10-CM | POA: Diagnosis present

## 2014-11-09 DIAGNOSIS — E860 Dehydration: Secondary | ICD-10-CM | POA: Diagnosis present

## 2014-11-09 DIAGNOSIS — F419 Anxiety disorder, unspecified: Secondary | ICD-10-CM | POA: Diagnosis present

## 2014-11-09 DIAGNOSIS — Z880 Allergy status to penicillin: Secondary | ICD-10-CM | POA: Diagnosis not present

## 2014-11-09 DIAGNOSIS — G629 Polyneuropathy, unspecified: Secondary | ICD-10-CM | POA: Diagnosis present

## 2014-11-09 DIAGNOSIS — G894 Chronic pain syndrome: Secondary | ICD-10-CM | POA: Diagnosis present

## 2014-11-09 DIAGNOSIS — E1140 Type 2 diabetes mellitus with diabetic neuropathy, unspecified: Secondary | ICD-10-CM | POA: Diagnosis present

## 2014-11-09 DIAGNOSIS — E1165 Type 2 diabetes mellitus with hyperglycemia: Secondary | ICD-10-CM | POA: Diagnosis present

## 2014-11-09 DIAGNOSIS — Z888 Allergy status to other drugs, medicaments and biological substances status: Secondary | ICD-10-CM | POA: Diagnosis not present

## 2014-11-09 DIAGNOSIS — B349 Viral infection, unspecified: Secondary | ICD-10-CM | POA: Diagnosis present

## 2014-11-09 DIAGNOSIS — R778 Other specified abnormalities of plasma proteins: Secondary | ICD-10-CM | POA: Diagnosis present

## 2014-11-09 DIAGNOSIS — F17210 Nicotine dependence, cigarettes, uncomplicated: Secondary | ICD-10-CM | POA: Diagnosis present

## 2014-11-09 DIAGNOSIS — N179 Acute kidney failure, unspecified: Secondary | ICD-10-CM | POA: Diagnosis present

## 2014-11-09 LAB — COMPREHENSIVE METABOLIC PANEL
ALK PHOS: 138 U/L — AB (ref 39–117)
ALT: 48 U/L — AB (ref 0–35)
AST: 39 U/L — AB (ref 0–37)
Albumin: 2.3 g/dL — ABNORMAL LOW (ref 3.5–5.2)
Anion gap: 13 (ref 5–15)
BUN: 78 mg/dL — ABNORMAL HIGH (ref 6–23)
CALCIUM: 8.5 mg/dL (ref 8.4–10.5)
CHLORIDE: 92 mmol/L — AB (ref 96–112)
CO2: 20 mmol/L (ref 19–32)
Creatinine, Ser: 5.41 mg/dL — ABNORMAL HIGH (ref 0.50–1.10)
GFR calc non Af Amer: 9 mL/min — ABNORMAL LOW (ref >90)
GFR, EST AFRICAN AMERICAN: 10 mL/min — AB (ref >90)
Glucose, Bld: 875 mg/dL (ref 70–99)
Potassium: 3.7 mmol/L (ref 3.5–5.1)
SODIUM: 125 mmol/L — AB (ref 135–145)
TOTAL PROTEIN: 6.1 g/dL (ref 6.0–8.3)
Total Bilirubin: 0.4 mg/dL (ref 0.3–1.2)

## 2014-11-09 LAB — CBG MONITORING, ED
GLUCOSE-CAPILLARY: 362 mg/dL — AB (ref 70–99)
GLUCOSE-CAPILLARY: 110 mg/dL — AB (ref 70–99)
Glucose-Capillary: 133 mg/dL — ABNORMAL HIGH (ref 70–99)
Glucose-Capillary: 139 mg/dL — ABNORMAL HIGH (ref 70–99)
Glucose-Capillary: 142 mg/dL — ABNORMAL HIGH (ref 70–99)
Glucose-Capillary: 148 mg/dL — ABNORMAL HIGH (ref 70–99)
Glucose-Capillary: 257 mg/dL — ABNORMAL HIGH (ref 70–99)
Glucose-Capillary: 476 mg/dL — ABNORMAL HIGH (ref 70–99)
Glucose-Capillary: 591 mg/dL (ref 70–99)
Glucose-Capillary: >600 mg/dL (ref 70–99)
Glucose-Capillary: >600 mg/dL (ref 70–99)
Glucose-Capillary: >600 mg/dL (ref 70–99)

## 2014-11-09 LAB — RAPID URINE DRUG SCREEN, HOSP PERFORMED
AMPHETAMINES: NOT DETECTED
BARBITURATES: NOT DETECTED
Benzodiazepines: NOT DETECTED
Cocaine: POSITIVE — AB
Opiates: NOT DETECTED
Tetrahydrocannabinol: NOT DETECTED

## 2014-11-09 LAB — BASIC METABOLIC PANEL
ANION GAP: 9 (ref 5–15)
BUN: 74 mg/dL — ABNORMAL HIGH (ref 6–23)
CO2: 24 mmol/L (ref 19–32)
Calcium: 8.6 mg/dL (ref 8.4–10.5)
Chloride: 100 mmol/L (ref 96–112)
Creatinine, Ser: 5.27 mg/dL — ABNORMAL HIGH (ref 0.50–1.10)
GFR calc non Af Amer: 9 mL/min — ABNORMAL LOW (ref >90)
GFR, EST AFRICAN AMERICAN: 10 mL/min — AB (ref >90)
Glucose, Bld: 219 mg/dL — ABNORMAL HIGH (ref 70–99)
Potassium: 3.1 mmol/L — ABNORMAL LOW (ref 3.5–5.1)
Sodium: 133 mmol/L — ABNORMAL LOW (ref 135–145)

## 2014-11-09 LAB — URINE MICROSCOPIC-ADD ON

## 2014-11-09 LAB — CBC
HEMATOCRIT: 20.4 % — AB (ref 36.0–46.0)
Hemoglobin: 6.7 g/dL — CL (ref 12.0–15.0)
MCH: 27.9 pg (ref 26.0–34.0)
MCHC: 32.8 g/dL (ref 30.0–36.0)
MCV: 85 fL (ref 78.0–100.0)
Platelets: 200 10*3/uL (ref 150–400)
RBC: 2.4 MIL/uL — ABNORMAL LOW (ref 3.87–5.11)
RDW: 14.3 % (ref 11.5–15.5)
WBC: 3.9 10*3/uL — ABNORMAL LOW (ref 4.0–10.5)

## 2014-11-09 LAB — TROPONIN I
TROPONIN I: 0.03 ng/mL (ref <0.031)
TROPONIN I: 0.03 ng/mL (ref <0.031)
TROPONIN I: 0.04 ng/mL — AB (ref <0.031)

## 2014-11-09 LAB — URINALYSIS, ROUTINE W REFLEX MICROSCOPIC
Bilirubin Urine: NEGATIVE
Glucose, UA: >1000 mg/dL — AB
Ketones, ur: NEGATIVE mg/dL
Leukocytes, UA: NEGATIVE
Nitrite: NEGATIVE
Protein, ur: 100 mg/dL — AB
Specific Gravity, Urine: 1.02 (ref 1.005–1.030)
UROBILINOGEN UA: 0.2 mg/dL (ref 0.0–1.0)
pH: 7 (ref 5.0–8.0)

## 2014-11-09 LAB — LIPID PANEL
CHOL/HDL RATIO: 2.1 ratio
Cholesterol: 117 mg/dL (ref 0–200)
HDL: 56 mg/dL (ref >39)
LDL Cholesterol: 45 mg/dL (ref 0–99)
Triglycerides: 82 mg/dL (ref <150)
VLDL: 16 mg/dL (ref 0–40)

## 2014-11-09 LAB — PROTIME-INR
INR: 1.21 (ref 0.00–1.49)
PROTHROMBIN TIME: 15.5 s — AB (ref 11.6–15.2)

## 2014-11-09 LAB — CREATININE, URINE, RANDOM: CREATININE, URINE: 19.51 mg/dL

## 2014-11-09 LAB — GLUCOSE, CAPILLARY
GLUCOSE-CAPILLARY: 163 mg/dL — AB (ref 70–99)
GLUCOSE-CAPILLARY: 223 mg/dL — AB (ref 70–99)
Glucose-Capillary: 134 mg/dL — ABNORMAL HIGH (ref 70–99)

## 2014-11-09 LAB — NO BLOOD PRODUCTS

## 2014-11-09 LAB — TSH: TSH: 4.519 u[IU]/mL — AB (ref 0.350–4.500)

## 2014-11-09 LAB — BRAIN NATRIURETIC PEPTIDE: B Natriuretic Peptide: 242 pg/mL — ABNORMAL HIGH (ref 0.0–100.0)

## 2014-11-09 LAB — PREPARE RBC (CROSSMATCH)

## 2014-11-09 MED ORDER — METOCLOPRAMIDE HCL 5 MG/ML IJ SOLN
10.0000 mg | Freq: Two times a day (BID) | INTRAMUSCULAR | Status: DC
  Administered 2014-11-09: 10 mg via INTRAVENOUS
  Filled 2014-11-09 (×3): qty 2

## 2014-11-09 MED ORDER — DIPHENHYDRAMINE HCL 25 MG PO CAPS: 25.0000 mg | ORAL_CAPSULE | Freq: Once | ORAL | Status: DC

## 2014-11-09 MED ORDER — LEVETIRACETAM 500 MG PO TABS
1000.0000 mg | ORAL_TABLET | Freq: Every day | ORAL | Status: DC
  Administered 2014-11-09 – 2014-11-10 (×2): 1000 mg via ORAL
  Filled 2014-11-09 (×4): qty 2

## 2014-11-09 MED ORDER — MUPIROCIN CALCIUM 2 % EX CREA: 1.0000 "application " | TOPICAL_CREAM | Freq: Every day | CUTANEOUS | Status: DC | PRN

## 2014-11-09 MED ORDER — SODIUM CHLORIDE 0.9 % IV BOLUS (SEPSIS)
1000.0000 mL | Freq: Once | INTRAVENOUS | Status: AC
  Administered 2014-11-09: 1000 mL via INTRAVENOUS

## 2014-11-09 MED ORDER — DEXTROSE 50 % IV SOLN: 25.0000 mL | INTRAVENOUS | Status: DC | PRN

## 2014-11-09 MED ORDER — CLONIDINE HCL 0.2 MG PO TABS
0.2000 mg | ORAL_TABLET | Freq: Every day | ORAL | Status: DC
  Administered 2014-11-09 – 2014-11-10 (×2): 0.2 mg via ORAL
  Filled 2014-11-09 (×3): qty 1

## 2014-11-09 MED ORDER — SODIUM CHLORIDE 0.9 % IJ SOLN
3.0000 mL | Freq: Two times a day (BID) | INTRAMUSCULAR | Status: DC
  Administered 2014-11-09 – 2014-11-10 (×3): 3 mL via INTRAVENOUS

## 2014-11-09 MED ORDER — SODIUM CHLORIDE 0.9 % IV SOLN: Freq: Once | INTRAVENOUS | Status: DC

## 2014-11-09 MED ORDER — CARVEDILOL 25 MG PO TABS
25.0000 mg | ORAL_TABLET | Freq: Two times a day (BID) | ORAL | Status: DC
  Administered 2014-11-09 – 2014-11-11 (×5): 25 mg via ORAL
  Filled 2014-11-09 (×8): qty 1

## 2014-11-09 MED ORDER — SODIUM CHLORIDE 0.9 % IV SOLN
510.0000 mg | INTRAVENOUS | Status: DC
  Filled 2014-11-09: qty 17

## 2014-11-09 MED ORDER — ONDANSETRON HCL 4 MG PO TABS: 4.0000 mg | ORAL_TABLET | Freq: Four times a day (QID) | ORAL | Status: DC | PRN

## 2014-11-09 MED ORDER — METOCLOPRAMIDE HCL 5 MG/ML IJ SOLN
10.0000 mg | Freq: Three times a day (TID) | INTRAMUSCULAR | Status: DC
  Administered 2014-11-09 – 2014-11-10 (×3): 10 mg via INTRAVENOUS
  Filled 2014-11-09 (×5): qty 2

## 2014-11-09 MED ORDER — ZOLPIDEM TARTRATE 5 MG PO TABS
5.0000 mg | ORAL_TABLET | Freq: Every day | ORAL | Status: DC
  Administered 2014-11-09: 5 mg via ORAL
  Filled 2014-11-09 (×2): qty 1

## 2014-11-09 MED ORDER — ACIDOPHILUS 100 MG PO CAPS: 100.0000 mg | ORAL_CAPSULE | Freq: Every day | ORAL | Status: DC

## 2014-11-09 MED ORDER — CEFAZOLIN SODIUM-DEXTROSE 2-3 GM-% IV SOLR: 2.0000 g | INTRAVENOUS | Status: DC

## 2014-11-09 MED ORDER — DEXTROSE-NACL 5-0.45 % IV SOLN
INTRAVENOUS | Status: DC
  Administered 2014-11-09: 10:00:00 via INTRAVENOUS

## 2014-11-09 MED ORDER — HYDRALAZINE HCL 20 MG/ML IJ SOLN: 10.0000 mg | INTRAMUSCULAR | Status: DC | PRN

## 2014-11-09 MED ORDER — ATORVASTATIN CALCIUM 40 MG PO TABS
40.0000 mg | ORAL_TABLET | Freq: Every day | ORAL | Status: DC
  Administered 2014-11-09 – 2014-11-10 (×2): 40 mg via ORAL
  Filled 2014-11-09 (×4): qty 1

## 2014-11-09 MED ORDER — LABETALOL HCL 5 MG/ML IV SOLN
10.0000 mg | Freq: Once | INTRAVENOUS | Status: AC
  Administered 2014-11-09: 10 mg via INTRAVENOUS
  Filled 2014-11-09: qty 4

## 2014-11-09 MED ORDER — INSULIN REGULAR HUMAN 100 UNIT/ML IJ SOLN
INTRAMUSCULAR | Status: DC
  Administered 2014-11-09: 5.4 [IU]/h via INTRAVENOUS
  Filled 2014-11-09: qty 2.5

## 2014-11-09 MED ORDER — SODIUM CHLORIDE 0.9 % IV SOLN
Freq: Once | INTRAVENOUS | Status: AC
  Administered 2014-11-09: 11:00:00 via INTRAVENOUS

## 2014-11-09 MED ORDER — ONDANSETRON HCL 4 MG/2ML IJ SOLN: 4.0000 mg | Freq: Four times a day (QID) | INTRAMUSCULAR | Status: DC | PRN

## 2014-11-09 MED ORDER — HEPARIN SODIUM (PORCINE) 5000 UNIT/ML IJ SOLN
5000.0000 [IU] | Freq: Three times a day (TID) | INTRAMUSCULAR | Status: DC
  Administered 2014-11-09 – 2014-11-10 (×5): 5000 [IU] via SUBCUTANEOUS
  Filled 2014-11-09 (×7): qty 1

## 2014-11-09 MED ORDER — FUROSEMIDE 10 MG/ML IJ SOLN: 20.0000 mg | Freq: Once | INTRAMUSCULAR | Status: DC

## 2014-11-09 MED ORDER — FOLIC ACID 400 MCG PO TABS: 200.0000 ug | ORAL_TABLET | Freq: Every morning | ORAL | Status: DC

## 2014-11-09 MED ORDER — INSULIN ASPART 100 UNIT/ML ~~LOC~~ SOLN
0.0000 [IU] | Freq: Three times a day (TID) | SUBCUTANEOUS | Status: DC
  Administered 2014-11-09: 2 [IU] via SUBCUTANEOUS
  Administered 2014-11-10: 8 [IU] via SUBCUTANEOUS
  Administered 2014-11-10: 5 [IU] via SUBCUTANEOUS
  Administered 2014-11-11: 8 [IU] via SUBCUTANEOUS

## 2014-11-09 MED ORDER — ALUM & MAG HYDROXIDE-SIMETH 200-200-20 MG/5ML PO SUSP
30.0000 mL | Freq: Four times a day (QID) | ORAL | Status: DC | PRN
  Administered 2014-11-10 – 2014-11-11 (×2): 30 mL via ORAL
  Filled 2014-11-09 (×2): qty 30

## 2014-11-09 MED ORDER — SODIUM CHLORIDE 0.9 % IV SOLN
1000.0000 mL | INTRAVENOUS | Status: DC
  Administered 2014-11-09 (×2): 1000 mL via INTRAVENOUS

## 2014-11-09 MED ORDER — OXYCODONE-ACETAMINOPHEN 5-325 MG PO TABS
1.0000 | ORAL_TABLET | Freq: Four times a day (QID) | ORAL | Status: DC | PRN
  Administered 2014-11-09 – 2014-11-10 (×2): 1 via ORAL
  Filled 2014-11-09 (×3): qty 1

## 2014-11-09 MED ORDER — ACETAMINOPHEN 325 MG PO TABS: 650.0000 mg | ORAL_TABLET | Freq: Once | ORAL | Status: DC

## 2014-11-09 MED ORDER — AMLODIPINE BESYLATE 5 MG PO TABS
5.0000 mg | ORAL_TABLET | Freq: Every day | ORAL | Status: DC
  Administered 2014-11-09 – 2014-11-11 (×3): 5 mg via ORAL
  Filled 2014-11-09 (×3): qty 1

## 2014-11-09 MED ORDER — ACETAMINOPHEN 325 MG PO TABS
650.0000 mg | ORAL_TABLET | Freq: Once | ORAL | Status: AC
Start: 2014-11-09 — End: 2014-11-09
  Administered 2014-11-09: 650 mg via ORAL
  Filled 2014-11-09: qty 2

## 2014-11-09 MED ORDER — SODIUM CHLORIDE 0.9 % IV SOLN
500.0000 mg | Freq: Once | INTRAVENOUS | Status: AC
  Administered 2014-11-09: 500 mg via INTRAVENOUS
  Filled 2014-11-09: qty 5

## 2014-11-09 MED ORDER — SENNOSIDES-DOCUSATE SODIUM 8.6-50 MG PO TABS
1.0000 | ORAL_TABLET | Freq: Every evening | ORAL | Status: DC | PRN
  Filled 2014-11-09: qty 1

## 2014-11-09 MED ORDER — INSULIN GLARGINE 100 UNIT/ML ~~LOC~~ SOLN
10.0000 [IU] | Freq: Once | SUBCUTANEOUS | Status: AC
  Administered 2014-11-09: 10 [IU] via SUBCUTANEOUS
  Filled 2014-11-09: qty 0.1

## 2014-11-09 MED ORDER — ASPIRIN EC 325 MG PO TBEC
325.0000 mg | DELAYED_RELEASE_TABLET | Freq: Every day | ORAL | Status: DC
  Administered 2014-11-09 – 2014-11-11 (×3): 325 mg via ORAL
  Filled 2014-11-09 (×3): qty 1

## 2014-11-09 MED ORDER — ADULT MULTIVITAMIN W/MINERALS CH
1.0000 | ORAL_TABLET | Freq: Every day | ORAL | Status: DC
  Administered 2014-11-09 – 2014-11-11 (×3): 1 via ORAL
  Filled 2014-11-09 (×3): qty 1

## 2014-11-09 MED ORDER — INSULIN GLARGINE 100 UNIT/ML ~~LOC~~ SOLN
30.0000 [IU] | Freq: Every day | SUBCUTANEOUS | Status: DC
  Administered 2014-11-09: 30 [IU] via SUBCUTANEOUS
  Filled 2014-11-09: qty 0.3

## 2014-11-09 MED ORDER — NITROGLYCERIN 0.2 MG/HR TD PT24
0.2000 mg | MEDICATED_PATCH | Freq: Every day | TRANSDERMAL | Status: DC
  Administered 2014-11-09 – 2014-11-10 (×3): 0.2 mg via TRANSDERMAL
  Filled 2014-11-09 (×4): qty 1

## 2014-11-09 MED ORDER — NICOTINE 21 MG/24HR TD PT24
21.0000 mg | MEDICATED_PATCH | Freq: Every day | TRANSDERMAL | Status: DC
  Administered 2014-11-09 – 2014-11-10 (×2): 21 mg via TRANSDERMAL
  Filled 2014-11-09 (×3): qty 1

## 2014-11-09 MED ORDER — LACTULOSE 10 GM/15ML PO SOLN
30.0000 g | Freq: Every day | ORAL | Status: DC | PRN
  Filled 2014-11-09: qty 45

## 2014-11-09 MED ORDER — POLYETHYLENE GLYCOL 3350 17 G PO PACK
17.0000 g | PACK | Freq: Two times a day (BID) | ORAL | Status: DC
  Administered 2014-11-10 – 2014-11-11 (×3): 17 g via ORAL
  Filled 2014-11-09 (×6): qty 1

## 2014-11-09 MED ORDER — FE FUMARATE-B12-VIT C-FA-IFC PO CAPS
1.0000 | ORAL_CAPSULE | Freq: Every day | ORAL | Status: DC
  Administered 2014-11-09 – 2014-11-11 (×3): 1 via ORAL
  Filled 2014-11-09 (×3): qty 1

## 2014-11-09 MED ORDER — FERROUS SULFATE 325 (65 FE) MG PO TABS
325.0000 mg | ORAL_TABLET | Freq: Every day | ORAL | Status: DC
  Administered 2014-11-09 – 2014-11-11 (×3): 325 mg via ORAL
  Filled 2014-11-09 (×4): qty 1

## 2014-11-09 MED ORDER — HYDRALAZINE HCL 20 MG/ML IJ SOLN: 5.0000 mg | INTRAMUSCULAR | Status: DC | PRN

## 2014-11-09 MED ORDER — SENNOSIDES-DOCUSATE SODIUM 8.6-50 MG PO TABS
1.0000 | ORAL_TABLET | Freq: Every day | ORAL | Status: DC
  Administered 2014-11-09 – 2014-11-10 (×2): 1 via ORAL
  Filled 2014-11-09 (×2): qty 1

## 2014-11-09 MED ORDER — DIPHENHYDRAMINE HCL 50 MG/ML IJ SOLN
25.0000 mg | Freq: Once | INTRAMUSCULAR | Status: AC
  Administered 2014-11-09: 25 mg via INTRAVENOUS
  Filled 2014-11-09: qty 1

## 2014-11-09 NOTE — ED Notes (Signed)
Pt called RN into room. Reports she DOES want a blood transfusion now. Pt states, she has changed her mind and wants a blood transfusion at this time. Paged Dr. Sloan Leiter. He has been made aware.

## 2014-11-09 NOTE — ED Notes (Signed)
Attempted report 

## 2014-11-09 NOTE — Progress Notes (Signed)
LCSW is aware of patient. Patient to be medically admitted. Patient is known to psych and was recently here the previous week. Patient was referred for ACT services in the community. Agreeable with services with appointment for assessment today at 11:30am. LCSW alerted ACT team that she was admitted and they will come to hospital once room is known to complete assessment.  Patient agreeable at this time, however unit CSW made aware to reaffirm with patient once she gets upstairs as she has a tendency to change her mind.  ACT team:  Strategic Interventions:  [phone] [phone].  Lane Hacker, MSW Clinical Social Work: Emergency Room [phone]

## 2014-11-09 NOTE — Progress Notes (Signed)
PHARMACIST - PHYSICIAN ORDER COMMUNICATION  CONCERNING: P&T Medication Policy on Herbal Medications  DESCRIPTION:  This patient's order for:  Acidophilus  has been noted.  This product(s) is classified as an "herbal" or natural product. Due to a lack of definitive safety studies or FDA approval, nonstandard manufacturing practices, plus the potential risk of unknown drug-drug interactions while on inpatient medications, the Pharmacy and Therapeutics Committee does not permit the use of "herbal" or natural products of this type within [facility].   ACTION TAKEN: The pharmacy department is unable to verify this order at this time and your patient has been informed of this safety policy. Please reevaluate patient's clinical condition at discharge and address if the herbal or natural product(s) should be resumed at that time.

## 2014-11-09 NOTE — ED Notes (Signed)
CBG 133, MD notified.

## 2014-11-09 NOTE — Progress Notes (Signed)
RN called NP with troponn of .04. In the ED, pt had troponin of same. Other troponins have been <.03. Pt is having NO chest pain. Continue present treatment plan. Clance Boll, NP [facility]

## 2014-11-09 NOTE — ED Notes (Signed)
Pt CBG result was 362. Informed RN.

## 2014-11-09 NOTE — ED Notes (Signed)
Notified RN of CBG 110

## 2014-11-09 NOTE — ED Notes (Signed)
Pt CBG result recorded as HI (above 600). Informed Lennette Bihari - RN.

## 2014-11-09 NOTE — Progress Notes (Signed)
11/09/2014 called ed nurse for report at 1132. Per nurse in ED need to call MD about telemetry bed. ED nurse to call nurse back on [facility] RN.

## 2014-11-09 NOTE — Progress Notes (Signed)
Pt admitted to the unit at 1354. Pt mental status is alert and oriented. Pt oriented to room, staff, and call [place]. Skin is intact. Full assessment charted in [facility]. Call [facility] within reach. Visitor guidelines reviewed w/ pt and/or family.

## 2014-11-09 NOTE — ED Notes (Signed)
EKG was taken

## 2014-11-09 NOTE — ED Notes (Addendum)
MD at bedside, notified RN that bed request would be changed to tele.

## 2014-11-09 NOTE — ED Notes (Signed)
Per MD,  BMP to result prior to beginning blood transfusion.

## 2014-11-09 NOTE — ED Notes (Signed)
Per Dr. Blaine Hamper, took CBG on pt. Result registered HI (above 600). Informed Dr. Blaine Hamper and Lennette Bihari - RN.

## 2014-11-09 NOTE — ED Notes (Signed)
Gave pt water, per Dr. Sharol Given.

## 2014-11-09 NOTE — Progress Notes (Signed)
PATIENT DETAILS Name: [name] Age: 49 y.o. Sex: female Date of Birth: January 03, 1966 Admit Date: 11/08/2014 Admitting Physician Ivor Costa, MD [id number], Valla Leaver, MD  Subjective: Still vomiting this morning. But feels overall better than initial presentation.  Assessment/Plan: Principal Problem:   Uncontrolled type 2 diabetes mellitus with hyperosmolar nonketotic hyperglycemia: Suspect secondary to noncompliance to insulin. Presented with CBGs of more than 1000. Started on insulin infusion along with IV fluids. CBGs in the low 100s this morning, insulin drip stopped, start Lantus-it appears that the patient was given 30 units of Lantus at 0220 hours this morning-hence will only give 10 units today. Start SSI and follow CBGs. Last A1c 9.8 on January 2016.  Active Problems:   Nausea with vomiting: Suspected gastroparesis or viral syndrome. Continue supportive care, currently on scheduled Reglan-Will change to 3 times a day. If no further nausea/vomiting, will start clear liquids and slowly advance.    Anemia: Secondary to underlying chronic kidney disease. Suspect drop in hemoglobin is secondary to acute illness and IV fluids dilation. Will transfuse 1 unit of PRBC-please note patient is a Jehovah witness-in the past has refused transfusion- however this morning she consented.    Acute on chronic kidney disease stage IV: Acute renal failure likely secondary to prerenal azotemia from dehydration/uncontrolled diabetes. Hydrate and follow creatinine.    Chronic combined systolic and diastolic heart failure: Currently compensated. Cautiously continue IV fluids. Resume Lasix once volume status is much better.    Hypertension: Continue amlodipine, Coreg, clonidine. Follow BP and adjust medications accordingly    Seizure disorder: Continue Keppra    History of right supracondylar humerus fracture: Followed in the outpatient setting by orthopedics    Chronic pain syndrome: Difficult  situation, given suspicion for gastroparesis-need to minimize narcotics-however since she has chronic narcotic use-stopping narcotics may cause withdrawal symptoms.      History of hepatitis C: Further follow-up/workup/treatment deferred to the outpatient setting  Disposition: Remain inpatient  Antibiotics:  None   Anti-infectives    None      DVT Prophylaxis: Prophylactic Heparin  Code Status: Full code   Family Communication None at bedside  Procedures:  None  CONSULTS:  None  Time spent 40 minutes-which includes 50% of the time with face-to-face with patient/ family and coordinating care related to the above assessment and plan.  MEDICATIONS: Scheduled Meds: . amLODipine  5 mg Oral Daily  . aspirin EC  325 mg Oral Daily  . atorvastatin  40 mg Oral q1800  . carvedilol  25 mg Oral BID WC  . cloNIDine  0.2 mg Oral QHS  . ferrous Q000111Q C-folic acid  1 capsule Oral Daily  . ferrous sulfate  325 mg Oral Q breakfast  . heparin  5,000 Units Subcutaneous 3 times per day  . levETIRAcetam  1,000 mg Oral QHS  . metoCLOPramide (REGLAN) injection  10 mg Intravenous Q12H  . multivitamin with minerals  1 tablet Oral Daily  . nicotine  21 mg Transdermal Daily  . nitroGLYCERIN  0.2 mg Transdermal Daily  . polyethylene glycol  17 g Oral BID  . sodium chloride  3 mL Intravenous Q12H  . zolpidem  5 mg Oral QHS   Continuous Infusions: . sodium chloride 1,000 mL (11/09/14 0022)  . dextrose 5 % and 0.45% NaCl 75 mL/hr at 11/09/14 0932  . insulin (NOVOLIN-R) infusion 0.8 Units/hr (11/09/14 1333)   PRN Meds:.alum & mag hydroxide-simeth, dextrose, hydrALAZINE, mupirocin cream, ondansetron **OR**  ondansetron (ZOFRAN) IV, oxyCODONE-acetaminophen, senna-docusate    PHYSICAL EXAM: Vital signs in last 24 hours: Filed Vitals:   11/09/14 1200 11/09/14 1230 11/09/14 1300 11/09/14 1315  BP: 156/82 147/80 131/78 146/79  Pulse: 85 85 82 81  Temp:      TempSrc:        Resp: 11 15 11 17   Height:      Weight:      SpO2: 100% 100% 100% 100%    Weight change:  Filed Weights   11/08/14 1939  Weight: 78.291 kg (172 lb 9.6 oz)   Body mass index is 29.61 kg/(m^2).   Gen Exam: Awake and alert with clear speech.   Neck: Supple, No JVD.   Chest: B/L Clear.   CVS: S1 S2 Regular, no murmurs.  Abdomen: soft, BS +, non tender, non distended.  Extremities: no edema, lower extremities warm to touch. Neurologic: Non Focal.   Skin: No Rash.   Wounds: N/A.    Intake/Output from previous day:  Intake/Output Summary (Last 24 hours) at 11/09/14 1356 Last data filed at 11/09/14 1235  Gross per 24 hour  Intake   1850 ml  Output   1350 ml  Net    500 ml     LAB RESULTS: CBC  Recent Labs Lab 11/08/14 2214 11/09/14 0252  WBC 3.4* 3.9*  HGB 7.4* 6.7*  HCT 23.9* 20.4*  PLT 206 200  MCV 89.8 85.0  MCH 27.8 27.9  MCHC 31.0 32.8  RDW 14.8 14.3  LYMPHSABS 0.7  --   MONOABS 0.2  --   EOSABS 0.0  --   BASOSABS 0.0  --     Chemistries   Recent Labs Lab 11/08/14 2214 11/09/14 0252 11/09/14 0840  NA 120* 125* 133*  K 4.4 3.7 3.1*  CL 85* 92* 100  CO2 20 20 24   GLUCOSE 1048* 875* 219*  BUN 82* 78* 74*  CREATININE 5.64* 5.41* 5.27*  CALCIUM 8.8 8.5 8.6    CBG:  Recent Labs Lab 11/09/14 0921 11/09/14 1007 11/09/14 1123 11/09/14 1227 11/09/14 1330  GLUCAP 133* 110* 142* 148* 139*    GFR Estimated Creatinine Clearance: 13.2 mL/min (by C-G formula based on Cr of 5.27).  Coagulation profile  Recent Labs Lab 11/09/14 0240  INR 1.21    Cardiac Enzymes  Recent Labs Lab 11/08/14 2214 11/09/14 0240 11/09/14 0745  TROPONINI 0.04* 0.03 0.03    Invalid input(s): POCBNP No results for input(s): DDIMER in the last 72 hours. No results for input(s): HGBA1C in the last 72 hours.  Recent Labs  11/09/14 0248  CHOL 117  HDL 56  LDLCALC 45  TRIG 82  CHOLHDL 2.1    Recent Labs  11/09/14 0745  TSH 4.519*   No results  for input(s): VITAMINB12, FOLATE, FERRITIN, TIBC, IRON, RETICCTPCT in the last 72 hours.  Recent Labs  11/08/14 2214  LIPASE 45    Urine Studies No results for input(s): UHGB, CRYS in the last 72 hours.  Invalid input(s): UACOL, UAPR, USPG, UPH, UTP, UGL, UKET, UBIL, UNIT, UROB, ULEU, UEPI, UWBC, URBC, UBAC, CAST, UCOM, BILUA  MICROBIOLOGY: No results found for this or any previous visit (from the past 240 hour(s)).  RADIOLOGY STUDIES/RESULTS: Ct Head Wo Contrast  11/09/2014   CLINICAL DATA:  Dizziness for 4 days.  Nausea and vomiting.  EXAM: CT HEAD WITHOUT CONTRAST  TECHNIQUE: Contiguous axial images were obtained from the base of the skull through the vertex without intravenous contrast.  COMPARISON:  MRI  brain 02/02/2014.  CT head 02/02/2014.  FINDINGS: Diffuse cerebral atrophy. Prominent atrophy along the sylvian fissure bilaterally. Mild ventricular dilatation consistent with central atrophy. Low-attenuation changes in the deep white matter consistent with small vessel ischemia. Old lacunar infarct in the left basal ganglia. No mass effect or midline shift. No abnormal extra-axial fluid collections. Gray-white matter junctions are distinct. Basal cisterns are not effaced. No evidence of acute intracranial hemorrhage. No depressed skull fractures. Visualized paranasal sinuses and mastoid air cells are not opacified.  IMPRESSION: No acute intracranial abnormalities. Chronic atrophy and small vessel ischemic changes.   Electronically Signed   By: Lucienne Capers M.D.   On: 11/09/2014 01:40   US Renal  11/09/2014   CLINICAL DATA:  Initial evaluation for acute renal injury. History of chronic stage III renal disease, hypertension, diabetes, hep C.  EXAM: RENAL/URINARY TRACT ULTRASOUND COMPLETE  COMPARISON:  Prior study from 07/06/2014  FINDINGS: Right Kidney:  Length: 11.1 cm. Renal cortex diffusely echogenic in appearance, suggesting underlying medical renal disease. No mass or hydronephrosis  visualized.  Left Kidney:  Length: 11.3 cm. Renal cortex diffusely echogenic in appearance, suggesting underlying medical renal disease. No mass or hydronephrosis visualized.  Bladder:  Appears normal for degree of bladder distention. Bilateral ureteral jets present.  IMPRESSION: Echogenic kidneys bilaterally, suggesting underlying medical renal disease. No hydronephrosis or other acute abnormality.   Electronically Signed   By: Jeannine Boga M.D.   On: 11/09/2014 03:08    Oren Binet, MD  [facility] Pager:[phone]  If 7PM-7AM, please contact night-coverage [email or web address] Password [email or web address] 11/09/2014, 1:56 PM   LOS: 0 days

## 2014-11-09 NOTE — ED Notes (Signed)
Pt signed blood product refusal form with this RN as witness

## 2014-11-09 NOTE — ED Notes (Signed)
Notified RN of CBG 148

## 2014-11-09 NOTE — ED Notes (Signed)
Pt refuses to accept blood even thought hgb is low. This RN consulted with pt multiple times and she ademately refuses to accept blood. Admitting Dr Blaine Hamper notified and blood bank notified.

## 2014-11-09 NOTE — Progress Notes (Signed)
PT Cancellation Note  Patient Details Name: [name] MRN: [id number] DOB: March 18, 1966   Cancelled Treatment:    Reason Eval/Treat Not Completed: Medical issues which prohibited therapy Holding PT evaluation as pt hypokalemic with K+ of 3.1. Will follow up next available time or when pt medically stable.  Candy Sledge A 11/09/2014, 3:32 PM Candy Sledge, [place], DPT [phone]

## 2014-11-09 NOTE — H&P (Addendum)
[facility] History and Physical  [name] [id number] DOB: 1965/10/12 DOA: 11/08/2014  Referring physician: ED physician PCP: [name], MD  Specialists:   Chief Complaint: Nausea, vomiting, dizziness  HPI: [name] is a 49 y.o. female with past medical history of hypertension, GERD, depression, anxiety, seizure, congestive heart failure, history of stroke, bipolar, schizophrenia, diabetes mellitus, hepatitis C, CDK-IV, medication noncompliance, who presents with nausea and vomiting and dizziness.  Patient reports that in the past 2 days, she has been having nausea and vomiting. She vomited at least 5 times each day. No blood in the vomitus. Patient does not have diarrhea. She had one episode of abdominal pain, but no abdominal pain currently. Patient reports that she feels lightheaded and has dizziness. She did not pass out. She also reports having burning like chest pain which has resolved now. She has mild shortness of breath, no fever, chills, cough. She has tenderness over stumps of left amputated toes. Patient denies fever, chills, headaches, cough, dysuria, urgency, frequency, hematuria, skin rashes or leg swelling. No unilateral weakness, numbness or tingling sensations. No vision change or hearing loss.  In ED, patient was found to have elevated blood sugar level at 1048, pseudohyponatremia (with sodium 120 and blood sugar 1048), AoCDK, elevated troponin 0.04, lipase 45, temperature normal, blood pressure 218/107. EKG showed T-wave inversion in lead III, aVF and V6, which is similar but worse than previous EKG on 10/27/14. Patient is orthostatic per ED physician. Patient is admitted to inpatient for further evaluation and treatment.  Review of Systems: As presented in the history of presenting illness, rest negative.  Where does patient live?  At home Can patient participate in ADLs? some  Allergy:  Allergies  Allergen Reactions  . Gabapentin Itching and Swelling   . Gabapentin Swelling  . Lyrica [Pregabalin] Swelling    Facial and leg swelling  . Penicillins Hives    Pt tolerates cefepime and ceftriaxone  . Saphris [Asenapine] Swelling    Swelling of the face  . Saphris [Asenapine] Swelling    Facial swelling  . Tramadol Swelling    Causes legs to swell  . Tramadol Swelling    Leg swelling  . Penicillins Hives    Has tolerated Cefepime and Ceftriaxone.    Past Medical History  Diagnosis Date  . Neuropathy   . Glaucoma   . Bipolar 1 disorder   . Refusal of blood transfusions as patient is Jehovah's Witness   . Shortness of breath   . TIA (transient ischemic attack) 2010  . Neuromuscular disorder     DIABETIC NEUROPATHY  . MRSA infection ?2014    "on the back of my head; spread throughout my bloodstream"  . Bipolar disorder, unspecified 02/06/2014  . High cholesterol   . Type II diabetes mellitus     INSULIN DEPENDENT  . Hepatitis C   . Chronic pain syndrome   . Chronic kidney disease (CKD), stage III (moderate)   . Schizoaffective disorder, unspecified condition 02/08/2014  . Seizure dx'd 2014    "don't know what kind; last one was ~ 04/2014"  . Chronic combined systolic and diastolic CHF (congestive heart failure)     EF 40% by echo and 48% by stress test  . Anemia   . Hypertension   . Stroke     hx TIA  . Neuromuscular disorder     diabetic neuropathy  . Shortness of breath dyspnea   . Schizophrenia   . Seizures   .  Hepatitis     hep C  . Bipolar disorder   . Glaucoma   . Chronic pain   . Diabetes mellitus without complication     on Insulin  . Depression   . Anxiety   . Chronic kidney disease     chronic kidney stage 3 Dr Mercy Moore  . GERD (gastroesophageal reflux disease)   . History of hiatal hernia   . Headache     sinus headaches    Past Surgical History  Procedure Laterality Date  . Amputation Left 05/21/2013    Procedure: Left Midfoot AMPUTATION;  Surgeon: Newt Minion, MD;  Location: [place];  Service:  Orthopedics;  Laterality: Left;  Left Midfoot Amputation  . Tubal ligation    . Colonoscopy with propofol N/A 06/22/2013    Procedure: COLONOSCOPY WITH PROPOFOL;  Surgeon: Jeryl Columbia, MD;  Location: [facility];  Service: Endoscopy;  Laterality: N/A;  . Abdominal hysterectomy  2014  . Tonsillectomy  1970's  . Refractive surgery Bilateral 2013  . Tubal ligation  2000  . Abdominal hysterectomy  2010  . Foot amputation Left     due diabetic neuropathy, could not feel ulcer on bottom of foot  . Eye surgery Bilateral 2010    Lasik    Social History:  reports that she has been smoking Cigarettes.  She has a 20 pack-year smoking history. She has never used smokeless tobacco. She reports that she drinks alcohol. She reports that she uses illicit drugs ("Crack" cocaine and Cocaine).  Family History:  Family History  Problem Relation Age of Onset  . Hypertension Mother   . Hypertension Father      Prior to Admission medications   Medication Sig Start Date End Date Taking? Authorizing Provider  acetaminophen (TYLENOL) 500 MG tablet Take 500 mg by mouth every 6 (six) hours as needed for mild pain or moderate pain.   Yes Historical Provider, MD  amLODipine (NORVASC) 5 MG tablet Take 5 mg by mouth daily.  09/15/14  Yes Historical Provider, MD  aspirin EC 325 MG EC tablet Take 1 tablet (325 mg total) by mouth daily. 08/27/14  Yes Geradine Girt, DO  carvedilol (COREG) 25 MG tablet Take 1 tablet (25 mg total) by mouth 2 (two) times daily. 06/24/14  Yes Debbe Odea, MD  cloNIDine (CATAPRES) 0.2 MG tablet Take 0.2 mg by mouth at bedtime.  08/28/14  Yes Historical Provider, MD  Fe Bisgly-Vit C-Vit B12-FA 28-60-0.008-0.4 MG CAPS Take 1 tablet by mouth daily.   Yes Historical Provider, MD  ferrous sulfate 325 (65 FE) MG tablet Take 1 tablet (325 mg total) by mouth daily with breakfast. 06/24/14  Yes Debbe Odea, MD  folic acid (FOLVITE) A999333 MCG tablet Take 200 mcg by mouth every morning.    Yes  Historical Provider, MD  furosemide (LASIX) 40 MG tablet Take 2 tablets (80 mg total) by mouth 2 (two) times daily. 08/09/14  Yes Larene Pickett, PA-C  insulin aspart (NOVOLOG) 100 UNIT/ML injection Inject 2-10 Units into the skin 3 (three) times daily before meals. SSI: 0-200 = 2 units, 201-250 = 4 units, 251-300 = 6 units, 301-350 = 8 units, 351-400 = 10 units   Yes Historical Provider, MD  insulin glargine (LANTUS) 100 UNIT/ML injection Inject 0.3 mLs (30 Units total) into the skin daily. Patient taking differently: Inject 25 Units into the skin at bedtime.  08/27/14  Yes Geradine Girt, DO  Lactobacillus (ACIDOPHILUS) 100 MG CAPS Take 100 mg  by mouth daily.   Yes Historical Provider, MD  levETIRAcetam (KEPPRA) 500 MG tablet Take 1,000 mg by mouth at bedtime.  06/25/14  Yes Historical Provider, MD  Multiple Vitamin (MULTIVITAMIN WITH MINERALS) TABS tablet Take 1 tablet by mouth daily.   Yes Historical Provider, MD  mupirocin cream (BACTROBAN) 2 % Apply topically daily. Patient taking differently: Apply 1 application topically daily as needed (itching, skin irritation).  08/27/14  Yes Geradine Girt, DO  polyethylene glycol (MIRALAX / GLYCOLAX) packet Take 17 g by mouth 2 (two) times daily. Mix in 8 oz liquid and drink 08/24/14  Yes Historical Provider, MD  zolpidem (AMBIEN) 5 MG tablet Take 5 mg by mouth at bedtime.  09/19/14  Yes Historical Provider, MD  cephALEXin (KEFLEX) 500 MG capsule Take 1 capsule (500 mg total) by mouth every 12 (twelve) hours. Patient not taking: Reported on 11/08/2014 08/27/14   Geradine Girt, DO  oxyCODONE-acetaminophen (PERCOCET/ROXICET) 5-325 MG per tablet Take 1 tablet by mouth every 6 (six) hours as needed for moderate pain. Patient not taking: Reported on 11/09/2014 08/27/14   Geradine Girt, DO    Physical Exam: Filed Vitals:   11/09/14 0000 11/09/14 0030 11/09/14 0138 11/09/14 0145  BP: 201/87 206/94 182/87 191/85  Pulse: 100 102 102 102  Temp:      TempSrc:       Resp:   14 16  Height:      Weight:      SpO2: 100% 100% 100% 100%   General: Not in acute distress. Dry mucous in the membrane HEENT:       Eyes: PERRL, EOMI, no scleral icterus       ENT: No discharge from the ears and nose, no pharynx injection, no tonsillar enlargement.        Neck: No JVD, no bruit, no mass felt. Cardiac: S1/S2, RRR, No murmurs, No gallops or rubs Pulm: Good air movement bilaterally. Clear to auscultation bilaterally. No rales, wheezing, rhonchi or rubs. Abd: Soft, nondistended, nontender, no rebound pain, no organomegaly, BS present Ext: No edema bilaterally. 2+DP/PT pulse bilaterally. All toes in left foot was amputated, tenderness over stumps in left foot, but no lesion or drainage. Musculoskeletal: No joint deformities, erythema, or stiffness, ROM full Skin: No rashes.  Neuro: Alert and oriented X3, cranial nerves II-XII grossly intact, muscle strength 5/5 in all extremeties, sensation to light touch intact.  Psych: Patient is not psychotic, no suicidal or hemocidal ideation.  Labs on Admission:  Basic Metabolic Panel:  Recent Labs Lab 11/08/14 2214  NA 120*  K 4.4  CL 85*  CO2 20  GLUCOSE 1048*  BUN 82*  CREATININE 5.64*  CALCIUM 8.8   Liver Function Tests:  Recent Labs Lab 11/08/14 2214  AST 40*  ALT 53*  ALKPHOS 167*  BILITOT 0.5  PROT 6.9  ALBUMIN 2.6*    Recent Labs Lab 11/08/14 2214  LIPASE 45   No results for input(s): AMMONIA in the last 168 hours. CBC:  Recent Labs Lab 11/08/14 2214  WBC 3.4*  NEUTROABS 2.5  HGB 7.4*  HCT 23.9*  MCV 89.8  PLT 206   Cardiac Enzymes:  Recent Labs Lab 11/08/14 2214  TROPONINI 0.04*    BNP (last 3 results)  Recent Labs  08/09/14 1253 09/30/14 1324 10/27/14 2146  BNP 490.4* 61.6 63.7    ProBNP (last 3 results)  Recent Labs  02/23/14 0224 04/12/14 0520 06/22/14 1532  PROBNP 2305.0* 12801.0* 28725.0*    CBG:  Recent Labs Lab 11/09/14 0045 11/09/14 0116  11/09/14 0154  GLUCAP >600* >600* >600*    Radiological Exams on Admission: Ct Head Wo Contrast  11/09/2014   CLINICAL DATA:  Dizziness for 4 days.  Nausea and vomiting.  EXAM: CT HEAD WITHOUT CONTRAST  TECHNIQUE: Contiguous axial images were obtained from the base of the skull through the vertex without intravenous contrast.  COMPARISON:  MRI brain 02/02/2014.  CT head 02/02/2014.  FINDINGS: Diffuse cerebral atrophy. Prominent atrophy along the sylvian fissure bilaterally. Mild ventricular dilatation consistent with central atrophy. Low-attenuation changes in the deep white matter consistent with small vessel ischemia. Old lacunar infarct in the left basal ganglia. No mass effect or midline shift. No abnormal extra-axial fluid collections. Gray-white matter junctions are distinct. Basal cisterns are not effaced. No evidence of acute intracranial hemorrhage. No depressed skull fractures. Visualized paranasal sinuses and mastoid air cells are not opacified.  IMPRESSION: No acute intracranial abnormalities. Chronic atrophy and small vessel ischemic changes.   Electronically Signed   By: Lucienne Capers M.D.   On: 11/09/2014 01:40    EKG: Independently reviewed. T-wave inversion in lead III, aVF and V6, which is similar but worse than previous EKG on 10/27/14.  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 Pt refuses to accept blood transfusion. Consulted with pt multiple times and she ademately refuses to accept blood.   Nausea and vomiting: Etiology is not clear. It is likely  due to possible gastroparesis given her poorly controlled diabetes. Patient is constipated, which may have contributed to. Other possibility, such as viral gastroenteritis cannot be completely ruled out. Patient is severely dehydrated on admission.  -admit to SDU -IV reglan -zofran when necessary for nausea -IVF: 1L of NS, followed by 125 mL per hour (EF 40%, which limited aggressive IV fluids) -treat constipation MiraLAX and Senokot  DM-II: A1c was 9.8 on 08/25/14. Patient has not been compliant to the medications, she intermittently used Lantus 80 units daily. Her initial blood sugar level was 1048. Patient is oriented 3. No elevated AG. Insulin drip was started in the emergency room. -will continue insulin drip now -Lantus 30 units daily, start now.  -CBG q1h, will d/c insulin drip when CBG< 300 -check BMP at 2:00 AM and 5:00 AM -IVF as above  HTN: Patient's blood pressure is 218/107. It is likely due to medication noncompliance. -treated with IV labetalol in emergency room -start nitroglycerin patch 0.2 mg now -Continue home medications: Clonidine, Coreg, amlodipine -Will target lowering blood pressure by 30% tonight (to about SBP 150 to 160) -When necessary hydralazine  Elevated troponin: Troponin 0.04, it is likely due to worsening renal function. She had one episode of chest pain, which has resolved currently. EKG showed slightly worsening T-wave inversion than previous EKG on 10/27/14. -Aspirin, morphine prn, Coreg, -start Lipitor 40 mg daily -Check FLP and TSH  AoCKD-IV: Baseline creatinine is 4-5. Her creatinine is 5.64, which is likely due to prerenal secondary to dehydration. -hold lasix -IVF as above -check FeUrea and US-renal  Combined systolic and diastolic congestive heart failure:  2-D echo on 08/26/14 showed a year of 40%. Patient is taking Lasix 80 mg twice a day. She is dehydrated on admission. -hold lasix -check bnp  Seizure: Patient is taking Keppra at  home. -Continue oral Keppra 1000 mg daily -Given 1 dose of Keppra 500 mg 1 by IV since patient may have not been able to take Keppra orally due to vomiting. -check Keppra level  Tobacco abuse: -nicotine patch  DVT ppx: SQ Heparin    Code Status: Full code Family Communication: None at bed side.    Disposition Plan: Admit to inpatient   Date of Service 11/09/2014    Ivor Costa [facility] Pager [phone]  If 7PM-7AM, please contact night-coverage [email or web address] Password [email or web address] 11/09/2014, 2:08 AM

## 2014-11-10 ENCOUNTER — Inpatient Hospital Stay (HOSPITAL_COMMUNITY): Payer: Medicare Other

## 2014-11-10 LAB — CBC
HCT: 22.9 % — ABNORMAL LOW (ref 36.0–46.0)
HEMOGLOBIN: 7.6 g/dL — AB (ref 12.0–15.0)
MCH: 28.3 pg (ref 26.0–34.0)
MCHC: 33.2 g/dL (ref 30.0–36.0)
MCV: 85.1 fL (ref 78.0–100.0)
Platelets: 196 10*3/uL (ref 150–400)
RBC: 2.69 MIL/uL — ABNORMAL LOW (ref 3.87–5.11)
RDW: 14.9 % (ref 11.5–15.5)
WBC: 3.8 10*3/uL — ABNORMAL LOW (ref 4.0–10.5)

## 2014-11-10 LAB — TYPE AND SCREEN
ABO/RH(D): O POS
ANTIBODY SCREEN: NEGATIVE
Unit division: 0

## 2014-11-10 LAB — BASIC METABOLIC PANEL
ANION GAP: 10 (ref 5–15)
BUN: 66 mg/dL — AB (ref 6–23)
CO2: 20 mmol/L (ref 19–32)
Calcium: 8.1 mg/dL — ABNORMAL LOW (ref 8.4–10.5)
Chloride: 102 mmol/L (ref 96–112)
Creatinine, Ser: 5.1 mg/dL — ABNORMAL HIGH (ref 0.50–1.10)
GFR, EST AFRICAN AMERICAN: 11 mL/min — AB (ref >90)
GFR, EST NON AFRICAN AMERICAN: 9 mL/min — AB (ref >90)
Glucose, Bld: 284 mg/dL — ABNORMAL HIGH (ref 70–99)
POTASSIUM: 3.4 mmol/L — AB (ref 3.5–5.1)
SODIUM: 132 mmol/L — AB (ref 135–145)

## 2014-11-10 LAB — GLUCOSE, CAPILLARY
GLUCOSE-CAPILLARY: 277 mg/dL — AB (ref 70–99)
GLUCOSE-CAPILLARY: 240 mg/dL — AB (ref 70–99)
GLUCOSE-CAPILLARY: 370 mg/dL — AB (ref 70–99)

## 2014-11-10 LAB — UREA NITROGEN, URINE: Urea Nitrogen, Ur: 234 mg/dL

## 2014-11-10 MED ORDER — OXYCODONE-ACETAMINOPHEN 5-325 MG PO TABS
1.0000 | ORAL_TABLET | Freq: Four times a day (QID) | ORAL | Status: DC | PRN
  Administered 2014-11-10: 1 via ORAL
  Filled 2014-11-10: qty 1

## 2014-11-10 MED ORDER — INSULIN GLARGINE 100 UNIT/ML ~~LOC~~ SOLN
30.0000 [IU] | Freq: Every day | SUBCUTANEOUS | Status: DC
  Administered 2014-11-10: 30 [IU] via SUBCUTANEOUS
  Filled 2014-11-10 (×2): qty 0.3

## 2014-11-10 MED ORDER — INSULIN ASPART 100 UNIT/ML ~~LOC~~ SOLN
8.0000 [IU] | Freq: Three times a day (TID) | SUBCUTANEOUS | Status: DC
  Administered 2014-11-10: 8 [IU] via SUBCUTANEOUS

## 2014-11-10 MED ORDER — METOCLOPRAMIDE HCL 5 MG/ML IJ SOLN
10.0000 mg | Freq: Three times a day (TID) | INTRAMUSCULAR | Status: DC | PRN
  Filled 2014-11-10: qty 2

## 2014-11-10 MED ORDER — FAMOTIDINE 20 MG PO TABS
20.0000 mg | ORAL_TABLET | Freq: Two times a day (BID) | ORAL | Status: DC | PRN
  Administered 2014-11-10 – 2014-11-11 (×2): 20 mg via ORAL
  Filled 2014-11-10 (×4): qty 1

## 2014-11-10 NOTE — Care Management Note (Unsigned)
    Page 1 of 1   11/11/2014     11:15:31 AM CARE MANAGEMENT NOTE 11/11/2014  Patient:  [name], [name]   Account Number:  [id number]  Date Initiated:  11/10/2014  Documentation initiated by:  Tomi Bamberger  Subjective/Objective Assessment:   dx uncontrolled dm 2, hyperosmolar nonketototic hyperglycemia  admit- lives with son.     Action/Plan:   pt eval- rec hhpt   Anticipated DC Date:  11/11/2014   Anticipated DC Plan:  [place]  CM consult      [facility] Choice  HOME HEALTH   Choice offered to / List presented to:  C-1 Patient           Status of service:  In process, will continue to follow Medicare Important Message given?  YES (If response is "NO", the following Medicare IM given date fields will be blank) Date Medicare IM given:  11/11/2014 Medicare IM given by:  Carles Collet Date Additional Medicare IM given:   Additional Medicare IM given by:    Discharge Disposition:    Per UR Regulation:  Reviewed for med. necessity/level of care/duration of stay  If discussed at [place] of Stay Meetings, dates discussed:    Comments:  4/8 spoke with patient to see if she chose a home health agency to provide PT for her. she stated that she was going to go to her son's to get her stuff, then going to stay with her sister, but hasn't asked her yet, then stated she may just stay at a battered women's shelter but really wanted to go to a SNF. informed pt that i would send the SW in to speak with her about her discharge, but that we may not be able to send to her SNF unless it is medically necessary and at this time it does not seem to be. [place] updated about situation and also pt's request for a taxi voucher. Carles Collet RN BSN CM  11/10/14 1641 Tomi Bamberger RN, BSN [phone] patient states she does not think she is going to be going home with her son, she will let me know tomorrow, and she will let me know what agency she would like to work  with.

## 2014-11-10 NOTE — Evaluation (Signed)
Physical Therapy Evaluation Patient Details Name: [name] MRN: [id number] DOB: 1966/03/26 Today's Date: 11/10/2014   History of Present Illness  49 y.o. female with past medical history of hypertension, GERD, depression, anxiety, seizure, congestive heart failu47 y.o. female with past medical history of hypertension, GERD, depression, anxiety, seizure, congestive heart failure, history of stroke, bipolar, schizophrenia, diabetes mellitus, hepatitis C, CDK-IV, medication noncompliance, who presents with nausea and vomiting and dizziness.  Clinical Impression  Pt admitted with above diagnosis. Pt currently with functional limitations due to the deficits listed below (see PT Problem List). Pt ambulated 15' in her room, distance limited due to pt feeling poorly. She reports multiple falls in past year, stated she has a walker and cane but doesn't use them.  Pt [name] benefit from skilled PT to increase their independence and safety with mobility to allow discharge to the venue listed below.       Follow Up Recommendations Home health PT    Equipment Recommendations  Rolling walker with 5" wheels    Recommendations for Other Services       Precautions / Restrictions Precautions Precautions: Fall Precaution Comments: pt reports multiple falls in past year, stated they are due to L mid-foot amputation Restrictions Weight Bearing Restrictions: No      Mobility  Bed Mobility               General bed mobility comments: NT- pt sitting up on EOB  Transfers Overall transfer level: Needs assistance Equipment used: None Transfers: Sit to/from Stand Sit to Stand: Min guard         General transfer comment: min/guard due to h/o falls  Ambulation/Gait Ambulation/Gait assistance: Min guard Ambulation Distance (Feet): 15 Feet Assistive device: None Gait Pattern/deviations: Decreased stride length   Gait velocity interpretation: at or above normal speed for age/gender General  Gait Details: pt stated she has a SW and cane but doesn't use them, reports multiple falls, gait distance limited by pt "not feeling well", min/guard for safety, pt tends to reach for support from sink, doorknob, wall, etc.  Stairs            Wheelchair Mobility    Modified Rankin (Stroke Patients Only)       Balance Overall balance assessment: Needs assistance   Sitting balance-Leahy Scale: Good     Standing balance support: Single extremity supported Standing balance-Leahy Scale: Poor                               Pertinent Vitals/Pain Pain Assessment: No/denies pain    Home Living Family/patient expects to be discharged to:: Private residence Living Arrangements: Children Available Help at Discharge: Available PRN/intermittently   Home Access: Stairs to enter   Entrance Stairs-Number of Steps: 5 Home Layout: One level Home Equipment: Walker - standard;Cane - single point Additional Comments: son lives with her, he works nights    Prior Function Level of Independence: Independent               Hand Dominance   Dominant Hand: Left    Extremity/Trunk Assessment   Upper Extremity Assessment: RUE deficits/detail RUE Deficits / Details: pt reports she broke R arm 6 months ago and that it wasn't treated due to her having low blood sugar, pt maintains elbow in flexed position, did not fully assess as pt stated she wasn't feeling well and needed to rest  Lower Extremity Assessment: RLE deficits/detail;LLE deficits/detail RLE Deficits / Details: knee ext 4/5 LLE Deficits / Details: L mid-foot amputation, knee ext -4/5  Cervical / Trunk Assessment: Normal  Communication   Communication: No difficulties  Cognition Arousal/Alertness: Awake/alert Behavior During Therapy: WFL for tasks assessed/performed Overall Cognitive Status: Within Functional Limits for tasks assessed                      General Comments       Exercises        Assessment/Plan    PT Assessment Patient needs continued PT services  PT Diagnosis Difficulty walking;Generalized weakness   PT Problem List Decreased strength;Decreased activity tolerance;Decreased balance;Decreased mobility;Decreased safety awareness  PT Treatment Interventions Gait training;DME instruction;Functional mobility training;Patient/family education;Balance training;Therapeutic exercise   PT Goals (Current goals can be found in the Care Plan section) Acute Rehab PT Goals Patient Stated Goal: to get her R arm fixed PT Goal Formulation: With patient Time For Goal Achievement: 11/24/14 Potential to Achieve Goals: Fair    Frequency Min 3X/week   Barriers to discharge        Co-evaluation               End of Session Equipment Utilized During Treatment: Gait belt Activity Tolerance: Patient limited by fatigue Patient left: in bed;with call [place]/phone within reach Nurse Communication: Mobility status         Time: [id number] PT Time Calculation (min) (ACUTE ONLY): 8 min   Charges:   PT Evaluation $Initial PT Evaluation Tier I: 1 Procedure     PT G Codes:        Philomena Doheny 11/10/2014, 9:27 AM [phone]

## 2014-11-10 NOTE — Progress Notes (Signed)
Notified Baltazar Najjar, NP that pt requesting medication for heartburn. Gave Maalox earlier, pt states it didn't help. Baltazar Najjar, NP gave order for pepcid. Will continue to monitor pt. Hassan Buckler

## 2014-11-10 NOTE — Consult Note (Signed)
   [facility] CM Inpatient Consult   11/10/2014  [name] 1966/07/05 [id number]   Patient evaluated for [place] Management services. Came to bedside to speak with patient. However, [name] was resting and asked writer to come back at later time. Left [facility] Care Management brochure at bedside for patient to review. Informed patient that writer will come back at later time. Will make inpatient RNCM aware that attempts are being made to engage patient for [place] Management.   [name], MSN-Ed, RN,BSN [facility] Liaison [phone]

## 2014-11-10 NOTE — Progress Notes (Signed)
PATIENT DETAILS Name: [name] Age: 49 y.o. Sex: female Date of Birth: [birth year] Admit Date: 11/08/2014 Admitting Physician Ivor Costa, MD [id number], Valla Leaver, MD  Subjective: No vomiting. Claims that when she woke up at 5 am-she thinks that she had some mild weakness in the right leg (seen by this MD at 8:50 am). Speech clear.   Assessment/Plan: Principal Problem:   Uncontrolled type 2 diabetes mellitus with hyperosmolar nonketotic hyperglycemia: Suspect secondary to noncompliance to insulin. Presented with CBGs of more than 1000. Started on insulin infusion along with IV fluids. Once CBGs were in range, she was transitioned to Lantus and SSI. Add 8 units of Novolog with meals.  Follow CBG's and adjust accordingly. Last A1c 9.8 on January 2016.  Active Problems:   Nausea with vomiting: Suspected gastroparesis or viral syndrome.Resolved, will change to Reglan to prn. Diet has been advanced to regular.    ?Right leg weakness: claims she noticed when she woke up at 5 am. This MD rounded on patient around 8:50 am. RUE has a fractured humerus-therefore painful-but strength seems ok, no weakness in RLE noted on my exam. Doubt CVA-but patient has many risk factors, therefore CT head done was neg, await MRI. Continue ASA.    Anemia: Secondary to underlying chronic kidney disease. Suspect drop in hemoglobin is secondary to acute illness and IV fluids dilation. Transfused 1 unit of PRBC-please note patient is a Jehov[employer] witness-in the past has refused transfusion- however  she consented.Hb 7.6 this am.    Acute on chronic kidney disease stage IV: Acute renal failure likely secondary to prerenal azotemia from dehydration/uncontrolled diabetes.Creatinine down trending. Claims to follow with a renal MD as outpatient-and claims that she does not want to go on HD. At this point lytes stable, no indication for HD.    Chronic combined systolic and diastolic heart failure: Currently compensated.   Will resume Lasix in the next few days    Hypertension: Continue amlodipine, Coreg, clonidine. Follow BP and adjust medications accordingly    Seizure disorder: Continue Keppra    History of right supracondylar humerus fracture: Followed in the outpatient setting by orthopedics    Chronic pain syndrome: Difficult situation, given suspicion for gastroparesis-need to minimize narcotics-however since she has chronic narcotic use-stopping narcotics may cause withdrawal symptoms.      History of hepatitis C: Further follow-up/workup/treatment deferred to the outpatient setting  Disposition: Remain inpatient  Antibiotics:  None   Anti-infectives    Start     Dose/Rate Route Frequency Ordered Stop   11/10/14 0600  ceFAZolin (ANCEF) IVPB 2 g/50 mL premix  Status:  Discontinued     2 g 100 mL/hr over 30 Minutes Intravenous On call to O.R. 11/09/14 1356 11/09/14 1423      DVT Prophylaxis: Prophylactic Heparin  Code Status: Full code   Family Communication None at bedside  Procedures:  None  CONSULTS:  None  Time spent 40 minutes-which includes 50% of the time with face-to-face with patient/ family and coordinating care related to the above assessment and plan.  MEDICATIONS: Scheduled Meds: . amLODipine  5 mg Oral Daily  . aspirin EC  325 mg Oral Daily  . atorvastatin  40 mg Oral q1800  . carvedilol  25 mg Oral BID WC  . cloNIDine  0.2 mg Oral QHS  . ferrous [id number] C-folic acid  1 capsule Oral Daily  . ferrous sulfate  325 mg Oral Q breakfast  . heparin  5,000 Units Subcutaneous 3 times per day  . insulin aspart  0-15 Units Subcutaneous TID WC  . insulin glargine  30 Units Subcutaneous Daily  . levETIRAcetam  1,000 mg Oral QHS  . metoCLOPramide (REGLAN) injection  10 mg Intravenous 3 times per day  . multivitamin with minerals  1 tablet Oral Daily  . nicotine  21 mg Transdermal Daily  . nitroGLYCERIN  0.2 mg Transdermal Daily  . polyethylene glycol   17 g Oral BID  . senna-docusate  1 tablet Oral QHS  . sodium chloride  3 mL Intravenous Q12H  . zolpidem  5 mg Oral QHS   Continuous Infusions:   PRN Meds:.alum & mag hydroxide-simeth, dextrose, hydrALAZINE, lactulose, mupirocin cream, ondansetron **OR** ondansetron (ZOFRAN) IV, oxyCODONE-acetaminophen    PHYSICAL EXAM: Vital signs in last 24 hours: Filed Vitals:   11/09/14 1522 11/09/14 2120 11/10/14 0528 11/10/14 1054  BP: 169/94 148/84 132/73 170/81  Pulse:  85 82   Temp: 98.3 F (36.8 C) 98.3 F (36.8 C) 98.5 F (36.9 C)   TempSrc: Oral Oral Oral   Resp: 16 18 18    Height:      Weight:   84 kg (185 lb 3 oz)   SpO2: 100% 100% 100%     Weight change: 5.209 kg (11 lb 7.8 oz) Filed Weights   11/08/14 1939 11/09/14 1401 11/10/14 0528  Weight: 78.291 kg (172 lb 9.6 oz) 83.5 kg (184 lb 1.4 oz) 84 kg (185 lb 3 oz)   Body mass index is 31.77 kg/(m^2).   Gen Exam: Awake and alert with clear speech.   Neck: Supple, No JVD.   Chest: B/L Clear. No rales  CVS: S1 S2 Regular, no murmurs.  Abdomen: soft, BS +, non tender, non distended.  Extremities: no edema, lower extremities warm to touch.Left partial foot amputation/ Neurologic: Non Focal.   Skin: No Rash.   Wounds: N/A.    Intake/Output from previous day:  Intake/Output Summary (Last 24 hours) at 11/10/14 1227 Last data filed at 11/10/14 0910  Gross per 24 hour  Intake 2557.17 ml  Output   1400 ml  Net 1157.17 ml     LAB RESULTS: CBC  Recent Labs Lab 11/08/14 2214 11/09/14 0252 11/10/14 0541  WBC 3.4* 3.9* 3.8*  HGB 7.4* 6.7* 7.6*  HCT 23.9* 20.4* 22.9*  PLT 206 200 196  MCV 89.8 85.0 85.1  MCH 27.8 27.9 28.3  MCHC 31.0 32.8 33.2  RDW 14.8 14.3 14.9  LYMPHSABS 0.7  --   --   MONOABS 0.2  --   --   EOSABS 0.0  --   --   BASOSABS 0.0  --   --     Chemistries   Recent Labs Lab 11/08/14 2214 11/09/14 0252 11/09/14 0840 11/10/14 0541  NA 120* 125* 133* 132*  K 4.4 3.7 3.1* 3.4*  CL 85* 92*  100 102  CO2 20 20 24 20   GLUCOSE 1048* 875* 219* 284*  BUN 82* 78* 74* 66*  CREATININE 5.64* 5.41* 5.27* 5.10*  CALCIUM 8.8 8.5 8.6 8.1*    CBG:  Recent Labs Lab 11/09/14 1330 11/09/14 1411 11/09/14 1813 11/09/14 2117 11/10/14 0806  GLUCAP 139* 163* 134* 223* 277*    GFR Estimated Creatinine Clearance: 14.1 mL/min (by C-G formula based on Cr of 5.1).  Coagulation profile  Recent Labs Lab 11/09/14 0240  INR 1.21    Cardiac Enzymes  Recent Labs Lab 11/09/14 0240 11/09/14 0745 11/09/14 1535  TROPONINI 0.03 0.03  0.04*    Invalid input(s): POCBNP No results for input(s): DDIMER in the last 72 hours. No results for input(s): HGBA1C in the last 72 hours.  Recent Labs  11/09/14 0248  CHOL 117  HDL 56  LDLCALC 45  TRIG 82  CHOLHDL 2.1    Recent Labs  11/09/14 0745  TSH 4.519*   No results for input(s): VITAMINB12, FOLATE, FERRITIN, TIBC, IRON, RETICCTPCT in the last 72 hours.  Recent Labs  11/08/14 2214  LIPASE 45    Urine Studies No results for input(s): UHGB, CRYS in the last 72 hours.  Invalid input(s): UACOL, UAPR, USPG, UPH, UTP, UGL, UKET, UBIL, UNIT, UROB, ULEU, UEPI, UWBC, URBC, UBAC, CAST, UCOM, BILUA  MICROBIOLOGY: No results found for this or any previous visit (from the past 240 hour(s)).  RADIOLOGY STUDIES/RESULTS: Ct Head Wo Contrast  11/10/2014   CLINICAL DATA:  Right leg weakness  EXAM: CT HEAD WITHOUT CONTRAST  TECHNIQUE: Contiguous axial images were obtained from the base of the skull through the vertex without intravenous contrast.  COMPARISON:  11/09/2014  FINDINGS: No skull fracture is noted. Paranasal sinuses and mastoid air cells are unremarkable.  No intracranial hemorrhage, mass effect or midline shift. Again noted old lacunar infarct in left basal ganglia.  Stable mild cerebral atrophy. Stable mild periventricular and patchy subcortical chronic white matter disease. No definite acute cortical infarction. No mass lesion is  noted on this unenhanced scan.  IMPRESSION: No definite acute cortical infarction. Stable atrophy and chronic mild white matter disease. 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   Electronically Signed   By: Lahoma Crocker M.D.   On: 11/10/2014 10:20   Ct Head Wo Contrast  11/09/2014   CLINICAL DATA:  Dizziness for 4 days.  Nausea and vomiting.  EXAM: CT HEAD WITHOUT CONTRAST  TECHNIQUE: Contiguous axial images were obtained from the base of the skull through the vertex without intravenous contrast.  COMPARISON:  MRI brain 02/02/2014.  CT head 02/02/2014.  FINDINGS: Diffuse cerebral atrophy. Prominent atrophy along the sylvian fissure bilaterally. Mild ventricular dilatation consistent with central atrophy. Low-attenuation changes in the deep white matter consistent with small vessel ischemia. Old lacunar infarct in the left basal ganglia. No mass effect or midline shift. No abnormal extra-axial fluid collections. Gray-white matter junctions are distinct. Basal cisterns are not effaced. No evidence of acute intracranial hemorrhage. No depressed skull fractures. Visualized paranasal sinuses and mastoid air cells are not opacified.  IMPRESSION: No acute intracranial abnormalities. Chronic atrophy and small vessel ischemic changes.   Electronically Signed   By: Lucienne Capers M.D.   On: 11/09/2014 01:40   US Renal  11/09/2014   CLINICAL DATA:  Initial evaluation for acute renal injury. History of chronic stage III renal disease, hypertension, diabetes, hep C.  EXAM: RENAL/URINARY TRACT ULTRASOUND COMPLETE  COMPARISON:  Prior study from 07/06/2014  FINDINGS: Right Kidney:  Length: 11.1 cm. Renal cortex diffusely echogenic in appearance, suggesting underlying medical renal disease. No mass or hydronephrosis visualized.  Left Kidney:  Length: 11.3 cm. Renal cortex diffusely echogenic in appearance, suggesting underlying medical renal disease. No mass or hydronephrosis visualized.  Bladder:  Appears normal  for degree of bladder distention. Bilateral ureteral jets present.  IMPRESSION: Echogenic kidneys bilaterally, suggesting underlying medical renal disease. No hydronephrosis or other acute abnormality.   Electronically Signed   By: Jeannine Boga M.D.   On: 11/09/2014 03:08    Oren Binet, MD  [facility] Pager:[phone]  If 7PM-7AM,  please contact night-coverage [email or web address] Password [email or web address] 11/10/2014, 12:27 PM   LOS: 1 day

## 2014-11-10 NOTE — Progress Notes (Signed)
Inpatient Diabetes Program Recommendations  AACE/ADA: New Consensus Statement on Inpatient Glycemic Control (2013)  Target Ranges:  Prepandial:   less than 140 mg/dL      Peak postprandial:   less than 180 mg/dL (1-2 hours)      Critically ill patients:  140 - 180 mg/dL  Results for KALEIGHA, WHAN (MRN [id number]) as of 11/10/2014 14:34  Ref. Range 08/25/2014 04:03  Hemoglobin A1C Latest Range: <5.7 % 9.8 (H)   Reason for Visit:  Spoke to patient regarding home diabetes medications.  She states that she takes Lantus 25 units daily at home and Humalog sliding scale.  States that she see's Dr. Sherle Poe practice.  Will follow.  Thanks, Adah Perl, RN, BC-ADM Inpatient Diabetes Coordinator Pager [phone] (8a-5p)

## 2014-11-10 NOTE — Progress Notes (Signed)
Pt refused bed alarm. Pt states she is not going to get up by her self. Pt told to call for assistance. Pt stated understanding. Will continue to monitor pt. [name]

## 2014-11-10 NOTE — Clinical Social Work Psych Note (Addendum)
Psych CSW was given report from Emergency Room CSW regarding patient's psychiatric needs.  Psychiatry signed off prior to patient being admitted to the medical floor.  ED CSW has set up ACTT services for the patient.    ACT team: [facility]: [phone].  Psych CSW contacted [facility] and provided patient's location to complete assessment for follow-up after discharge. No appointment could be made for this meeting.  Psych CSW requested the evaluator contact psych CSW prior to arriving.  At such time, psych CSW will update the unit.  Psych CSW signing off.  Please re-consult psychiatry if psych need should arise.  Nonnie Done, [place] [phone]  Psychiatric & Orthopedics (5N 1-8) Clinical Social Worker

## 2014-11-10 NOTE — Progress Notes (Signed)
Pt called out complaining in pain. RN went to bedside with pain medication. Pt asleep.

## 2014-11-10 NOTE — Progress Notes (Signed)
Inpatient Diabetes Program Recommendations  AACE/ADA: New Consensus Statement on Inpatient Glycemic Control (2013)  Target Ranges:  Prepandial:   less than 140 mg/dL      Peak postprandial:   less than 180 mg/dL (1-2 hours)      Critically ill patients:  140 - 180 mg/dL   Results for [name], [name] (MRN [id number]) as of 11/10/2014 11:19  Ref. Range 11/09/2014 10:07 11/09/2014 11:23 11/09/2014 12:27 11/09/2014 13:30 11/09/2014 14:11 11/09/2014 18:13 11/09/2014 21:17 11/10/2014 08:06  Glucose-Capillary Latest Range: 70-99 mg/dL 110 (H) 142 (H) 148 (H) 139 (H) 163 (H) 134 (H) 223 (H) 277 (H)  Results for [name], [name] (MRN [id number]) as of 11/10/2014 11:19  Ref. Range 11/08/2014 22:14  Glucose Latest Range: 70-99 mg/dL 1048 (HH)   Diabetes history: DM2 Outpatient Diabetes medications: Lantus 30 units daily, Novolog 2-10 units TID with meals; noted in H&P that patient reports taking Lantus intermittently sometimes up to 80 units Current orders for Inpatient glycemic control: Lantus 30 units daily, Novolog 0-15 units TID with meals  Inpatient Diabetes Program Recommendations Insulin - Basal: Patient received Lantus 30 units at 2:20 am on 4/6 and Lantus 10 units at 12:38 on 4/6. Fasting glucose this morning was 277 mg/dl. Please consider increasing Lantus to 35 units daily. Correction (SSI): Bedtime glucose 223 mg/dl last night but no correction given since no bedtime Novolog correction ordered. Please consider ordering Novolog bedtime correction scale.  Thanks, Barnie Alderman, RN, MSN, CCRN, CDE Diabetes Coordinator Inpatient Diabetes Program [phone] (Team Pager from [place] to [place]) [phone] (AP office) [phone] [facility] office)

## 2014-11-10 NOTE — Evaluation (Signed)
Occupational Therapy Evaluation Patient Details Name: [name] MRN: [id number] DOB: 1966-02-12 Today's Date: 11/10/2014    History of Present Illness 49 y.o. female with past medical history of hypertension, GERD, depression, anxiety, seizure, congestive heart failu37 y.o. female with past medical history of hypertension, GERD, depression, anxiety, seizure, congestive heart failure, history of stroke, bipolar, schizophrenia, diabetes mellitus, hepatitis C, CDK-IV, medication noncompliance, who presents with nausea and vomiting and dizziness.   Clinical Impression   PTA pt lived at home and was independent with ADLs. She reports difficulty with showering and mobility due to dizziness and weakness. Pt was minimally able to participate today due to feeling nauseous but is min guard for ADLs at this time. Pt will benefit from continued OT to address functional use of RUE and increased independence with ADLs.      Follow Up Recommendations  No OT follow up;Supervision/Assistance - 24 hour    Equipment Recommendations  3 in 1 bedside comode    Recommendations for Other Services       Precautions / Restrictions Precautions Precautions: Fall Precaution Comments: pt reports multiple falls in past year, stated they are due to L mid-foot amputation Restrictions Weight Bearing Restrictions: No      Mobility Bed Mobility Overal bed mobility: Modified Independent             General bed mobility comments: NT- pt sitting up on EOB  Transfers Overall transfer level: Needs assistance Equipment used: None Transfers: Sit to/from Stand Sit to Stand: Min guard         General transfer comment: min/guard due to h/o falls and mild dizziness         ADL Overall ADL's : Needs assistance/impaired                                       General ADL Comments: Pt minimally able to participate due to feeling poorly, however able to sit EOB and demonstrated ability to  reach socks. She reports her RUE was broken and has limited use however she "manages." Pt is left handed. Pt requires min guard for ADLs at this time due to c/o dizziness. Pt noted to be orthostatic in ED.      Vision Additional Comments: No change from baseline.           Pertinent Vitals/Pain Pain Assessment: No/denies pain ("just feel sick")     Hand Dominance Left   Extremity/Trunk Assessment Upper Extremity Assessment Upper Extremity Assessment: RUE deficits/detail RUE Deficits / Details: pt reports she broke R arm 6 months ago and that it wasn't treated due to her having low blood sugar, pt maintains elbow in flexed position, did not fully assess as pt stated she wasn't feeling well and needed to rest   Lower Extremity Assessment Lower Extremity Assessment: Defer to PT evaluation RLE Deficits / Details: knee ext 4/5 RLE Sensation: decreased light touch (R ball of foot) LLE Deficits / Details: L mid-foot amputation, knee ext -4/5   Cervical / Trunk Assessment Cervical / Trunk Assessment: Normal   Communication Communication Communication: No difficulties   Cognition Arousal/Alertness: Awake/alert (awake but tired) Behavior During Therapy: WFL for tasks assessed/performed Overall Cognitive Status: Within Functional Limits for tasks assessed  Home Living Family/patient expects to be discharged to:: Private residence Living Arrangements: Children Available Help at Discharge: Available PRN/intermittently Type of Home: House Home Access: Stairs to enter [employer] of Steps: 5   Home Layout: One level     Bathroom Shower/Tub: Teacher, early years/pre: Standard     Home Equipment: Environmental consultant - standard;Cane - single point   Additional Comments: son lives with her, he works nights      Prior Functioning/Environment Level of Independence: Independent             OT Diagnosis: Generalized  weakness;Acute pain   OT Problem List: Decreased strength;Decreased range of motion;Decreased activity tolerance;Impaired balance (sitting and/or standing);Impaired UE functional use;Pain   OT Treatment/Interventions: Self-care/ADL training;Therapeutic exercise;Energy conservation;DME and/or AE instruction;Therapeutic activities;Patient/family education;Balance training    OT Goals(Current goals can be found in the care plan section) Acute Rehab OT Goals Patient Stated Goal: to get her R arm fixed and feel better OT Goal Formulation: With patient Time For Goal Achievement: 11/24/14  OT Frequency: Min 2X/week    End of Session  Activity Tolerance: Patient limited by fatigue;Other (comment) (pt limited by nausea and not feeling well) Patient left: in bed;with call [place]/phone within reach;with bed alarm set   Time: [id number] OT Time Calculation (min): 12 min Charges:  OT General Charges $OT Visit: 1 Procedure OT Evaluation $Initial OT Evaluation Tier I: 1 Procedure G-Codes:    Juluis Rainier Nov 19, 2014, 11:01 AM  Cyndie Chime, OTR/L Occupational Therapist [phone] (pager)

## 2014-11-11 LAB — BASIC METABOLIC PANEL
Anion gap: 5 (ref 5–15)
BUN: 60 mg/dL — ABNORMAL HIGH (ref 6–23)
CALCIUM: 8 mg/dL — AB (ref 8.4–10.5)
CO2: 24 mmol/L (ref 19–32)
Chloride: 101 mmol/L (ref 96–112)
Creatinine, Ser: 4.98 mg/dL — ABNORMAL HIGH (ref 0.50–1.10)
GFR calc Af Amer: 11 mL/min — ABNORMAL LOW (ref >90)
GFR, EST NON AFRICAN AMERICAN: 9 mL/min — AB (ref >90)
GLUCOSE: 367 mg/dL — AB (ref 70–99)
POTASSIUM: 4.2 mmol/L (ref 3.5–5.1)
SODIUM: 130 mmol/L — AB (ref 135–145)

## 2014-11-11 LAB — GLUCOSE, CAPILLARY
GLUCOSE-CAPILLARY: 315 mg/dL — AB (ref 70–99)
Glucose-Capillary: 271 mg/dL — ABNORMAL HIGH (ref 70–99)

## 2014-11-11 MED ORDER — INSULIN GLARGINE 100 UNIT/ML ~~LOC~~ SOLN
36.0000 [IU] | Freq: Every day | SUBCUTANEOUS | Status: DC
  Administered 2014-11-11: 36 [IU] via SUBCUTANEOUS
  Filled 2014-11-11: qty 0.36

## 2014-11-11 MED ORDER — OXYCODONE-ACETAMINOPHEN 5-325 MG PO TABS: 1.0000 | ORAL_TABLET | Freq: Four times a day (QID) | ORAL | Status: DC | PRN

## 2014-11-11 MED ORDER — ATORVASTATIN CALCIUM 40 MG PO TABS: 40.0000 mg | ORAL_TABLET | Freq: Every day | ORAL | Status: DC

## 2014-11-11 MED ORDER — INSULIN GLARGINE 100 UNIT/ML ~~LOC~~ SOLN: 36.0000 [IU] | Freq: Every day | SUBCUTANEOUS | Status: DC

## 2014-11-11 MED ORDER — METOCLOPRAMIDE HCL 10 MG PO TABS: 10.0000 mg | ORAL_TABLET | Freq: Three times a day (TID) | ORAL | Status: DC | PRN

## 2014-11-11 MED ORDER — INSULIN ASPART 100 UNIT/ML ~~LOC~~ SOLN
12.0000 [IU] | Freq: Three times a day (TID) | SUBCUTANEOUS | Status: DC
  Administered 2014-11-11: 12 [IU] via SUBCUTANEOUS

## 2014-11-11 NOTE — Progress Notes (Signed)
Notified Baltazar Najjar, NP that pt refusing to put telemetry back on. Pt states, " it is pulling my skin and I am not putting it back on". Notified central telemetry that pt refusing telemetry. Will continue to monitor pt. Ranelle Oyster, RN

## 2014-11-11 NOTE — Consult Note (Signed)
   [facility] CM Inpatient Consult   11/11/2014  PIPPA TALLARICO Apr 16, 1966 [id number]   Came to bedside to follow up with patient regarding [facility] services. Unfortunately, she had already been discharged prior to visit. Confirmed discharge with inpatient Licensed CSW.  [name], MSN-Ed, RN,BSN [facility] Liaison [phone]

## 2014-11-11 NOTE — Progress Notes (Signed)
Pt. Received discharge instructions and prescriptions from Charge RN (Ginger G.). CSW gave pt. A cab voulcher and cab was called. Removed IV. No skin issues noted. All questions answered. No further needs noted at this time.

## 2014-11-11 NOTE — Discharge Summary (Signed)
PATIENT DETAILS Name: [name] Age: 49 y.o. Sex: female Date of Birth: 07-27-1966 MRN: [id number]. Admitting Physician: Ivor Costa, MD [id number], Valla Leaver, MD  Admit Date: 11/08/2014 Discharge date: 11/11/2014  Recommendations for Outpatient Follow-up:  1. 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 Neuropathy   . Glaucoma   . Bipolar 1 disorder   . Refusal of blood transfusions as patient is Jehovah's Witness   . Shortness of breath   . TIA (transient ischemic attack) 2010  . Neuromuscular disorder     DIABETIC NEUROPATHY  . MRSA infection ?2014    "on the back of my head; spread throughout my bloodstream"  . Bipolar disorder, unspecified 02/06/2014  . High cholesterol   . Type II diabetes mellitus     INSULIN DEPENDENT  . Hepatitis C   . Chronic pain syndrome   . Chronic kidney disease (CKD), stage III (moderate)   . Schizoaffective disorder, unspecified condition 02/08/2014  . Seizure dx'd 2014    "don't know what kind; last one was ~ 04/2014"  . Chronic combined systolic and diastolic CHF (congestive heart failure)     EF 40% by echo and 48% by stress test  . Anemia   . Hypertension   . Stroke     hx TIA  . Neuromuscular disorder     diabetic neuropathy  . Shortness of breath dyspnea   . Schizophrenia   . Seizures   . Hepatitis     hep C  . Bipolar disorder   . Glaucoma     . Chronic pain   . Diabetes mellitus without complication     on Insulin  . Depression   . Anxiety   . Chronic kidney disease     chronic kidney stage 3 Dr Mercy Moore  . GERD (gastroesophageal reflux disease)   . History of hiatal hernia   . Headache     sinus headaches    DISCHARGE MEDICATIONS: Current Discharge Medication List    START taking these medications   Details  atorvastatin (LIPITOR) 40 MG tablet Take 1 tablet (40 mg total) by mouth daily at 6 PM. Qty: 30 tablet, Refills: 0    metoCLOPramide (REGLAN) 10 MG tablet Take 1 tablet (10 mg total) by mouth every 8 (eight) hours as needed for nausea. Qty: 30 tablet, Refills: 0      CONTINUE these medications which have CHANGED   Details  insulin glargine (LANTUS) 100 UNIT/ML injection Inject 0.36 mLs (36 Units total) into the skin daily. Qty: 10 mL, Refills: 11    oxyCODONE-acetaminophen (PERCOCET/ROXICET) 5-325 MG per tablet Take 1 tablet by mouth every 6 (six) hours as needed for moderate pain. Qty: 30 tablet, Refills: 0      CONTINUE these medications which have NOT CHANGED   Details  acetaminophen (TYLENOL) 500 MG tablet Take 500 mg by mouth every  6 (six) hours as needed for mild pain or moderate pain.    amLODipine (NORVASC) 5 MG tablet Take 5 mg by mouth daily.  Refills: 6    aspirin EC 325 MG EC tablet Take 1 tablet (325 mg total) by mouth daily. Qty: 30 tablet, Refills: 0    carvedilol (COREG) 25 MG tablet Take 1 tablet (25 mg total) by mouth 2 (two) times daily. Qty: 60 tablet, Refills: 0    cloNIDine (CATAPRES) 0.2 MG tablet Take 0.2 mg by mouth at bedtime.  Refills: 0    Fe Bisgly-Vit C-Vit B12-FA 28-60-0.008-0.4 MG CAPS Take 1 tablet by mouth daily.    ferrous sulfate 325 (65 FE) MG tablet Take 1 tablet (325 mg total) by mouth daily with breakfast. Qty: 30 tablet, Refills: 0    folic acid (FOLVITE) A999333 MCG tablet Take 200 mcg by mouth every morning.     furosemide (LASIX) 40 MG tablet Take 2  tablets (80 mg total) by mouth 2 (two) times daily. Qty: 60 tablet, Refills: 0    insulin aspart (NOVOLOG) 100 UNIT/ML injection Inject 2-10 Units into the skin 3 (three) times daily before meals. SSI: 0-200 = 2 units, 201-250 = 4 units, 251-300 = 6 units, 301-350 = 8 units, 351-400 = 10 units    Lactobacillus (ACIDOPHILUS) 100 MG CAPS Take 100 mg by mouth daily.    levETIRAcetam (KEPPRA) 500 MG tablet Take 1,000 mg by mouth at bedtime.  Refills: 0    Multiple Vitamin (MULTIVITAMIN WITH MINERALS) TABS tablet Take 1 tablet by mouth daily.    mupirocin cream (BACTROBAN) 2 % Apply topically daily. Qty: 15 g, Refills: 0    polyethylene glycol (MIRALAX / GLYCOLAX) packet Take 17 g by mouth 2 (two) times daily. Mix in 8 oz liquid and drink Refills: 11    zolpidem (AMBIEN) 5 MG tablet Take 5 mg by mouth at bedtime.  Refills: 1      STOP taking these medications     cephALEXin (KEFLEX) 500 MG capsule         ALLERGIES:   Allergies  Allergen Reactions  . Gabapentin Itching and Swelling  . Gabapentin Swelling  . Lyrica [Pregabalin] Swelling    Facial and leg swelling  . Penicillins Hives    Pt tolerates cefepime and ceftriaxone  . Saphris [Asenapine] Swelling    Swelling of the face  . Saphris [Asenapine] Swelling    Facial swelling  . Tramadol Swelling    Causes legs to swell  . Tramadol Swelling    Leg swelling  . Penicillins Hives    Has tolerated Cefepime and Ceftriaxone.    BRIEF HPI:  See H&P, Labs, Consult and Test reports for all details in brief, patient is a 49 year old female with history of hypertension, gaseous esophageal reflux disease, anemia of chronic disease, chronic disease stage IV, medical noncompliance presented with nausea vomiting and was found to have CBGs of more than 1000. Patient was admitted for further evaluation and treatment  CONSULTATIONS:   None  PERTINENT RADIOLOGIC STUDIES: Ct Head Wo Contrast  11/10/2014   CLINICAL DATA:  Right leg  weakness  EXAM: CT HEAD WITHOUT CONTRAST  TECHNIQUE: Contiguous axial images were obtained from the base of the skull through the vertex without intravenous contrast.  COMPARISON:  11/09/2014  FINDINGS: No skull fracture is noted. Paranasal sinuses and mastoid air cells are unremarkable.  No intracranial hemorrhage, mass effect or midline shift. Again noted old lacunar infarct in left basal ganglia.  Stable mild cerebral atrophy. Stable mild periventricular and patchy subcortical chronic white matter disease. No definite acute cortical infarction. No mass lesion is noted on this unenhanced scan.  IMPRESSION: No definite acute cortical infarction. Stable atrophy and chronic mild white matter disease. 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   Electronically Signed   By: Lahoma Crocker M.D.   On: 11/10/2014 10:20   Ct Head Wo Contrast  11/09/2014   CLINICAL DATA:  Dizziness for 4 days.  Nausea and vomiting.  EXAM: CT HEAD WITHOUT CONTRAST  TECHNIQUE: Contiguous axial images were obtained from the base of the skull through the vertex without intravenous contrast.  COMPARISON:  MRI brain 02/02/2014.  CT head 02/02/2014.  FINDINGS: Diffuse cerebral atrophy. Prominent atrophy along the sylvian fissure bilaterally. Mild ventricular dilatation consistent with central atrophy. Low-attenuation changes in the deep white matter consistent with small vessel ischemia. Old lacunar infarct in the left basal ganglia. No mass effect or midline shift. No abnormal extra-axial fluid collections. Gray-white matter junctions are distinct. Basal cisterns are not effaced. No evidence of acute intracranial hemorrhage. No depressed skull fractures. Visualized paranasal sinuses and mastoid air cells are not opacified.  IMPRESSION: No acute intracranial abnormalities. Chronic atrophy and small vessel ischemic changes.   Electronically Signed   By: Lucienne Capers M.D.   On: 11/09/2014 01:40   Mr Brain Wo Contrast  11/10/2014    CLINICAL DATA:  Seizure disorder. Nausea and vomiting. Prior history of stroke. Initial encounter.  EXAM: MRI HEAD WITHOUT CONTRAST  TECHNIQUE: Multiplanar, multiecho pulse sequences of the brain and surrounding structures were obtained without intravenous contrast.  COMPARISON:  CT head 11/10/2014.  MR head 02/02/2014.  FINDINGS: No evidence for acute infarction, hemorrhage, mass lesion, hydrocephalus, or extra-axial fluid.  Fairly symmetric temporoparietal atrophy with marked prominence of the central sulcus and sylvian fissures. Mild to moderate small vessel disease is stable. Flow voids are maintained throughout the carotid, basilar, and vertebral arteries. There are no areas of chronic hemorrhage. Pituitary, pineal, and cerebellar tonsils unremarkable. No upper cervical lesions. No asymmetric Hippocampal insult or volume loss.  IMPRESSION: Severe atrophy is stable.  No acute intracranial findings.   Electronically Signed   By: Rolla Flatten M.D.   On: 11/10/2014 13:16   US Renal  11/09/2014   CLINICAL DATA:  Initial evaluation for acute renal injury. History of chronic stage III renal disease, hypertension, diabetes, hep C.  EXAM: RENAL/URINARY TRACT ULTRASOUND COMPLETE  COMPARISON:  Prior study from 07/06/2014  FINDINGS: Right Kidney:  Length: 11.1 cm. Renal cortex diffusely echogenic in appearance, suggesting underlying medical renal disease. No mass or hydronephrosis visualized.  Left Kidney:  Length: 11.3 cm. Renal cortex diffusely echogenic in appearance, suggesting underlying medical renal disease. No mass or hydronephrosis visualized.  Bladder:  Appears normal for degree of bladder distention. Bilateral ureteral jets present.  IMPRESSION: Echogenic kidneys bilaterally, suggesting underlying medical renal disease. No hydronephrosis or other acute abnormality.   Electronically Signed   By: Jeannine Boga M.D.   On: 11/09/2014 03:08     PERTINENT LAB RESULTS: CBC:  Recent Labs  11/09/14 0252  11/10/14 0541  WBC 3.9* 3.8*  HGB 6.7* 7.6*  HCT 20.4* 22.9*  PLT 200 196   CMET CMP     Component Value Date/Time   NA 130* 11/11/2014 0404   K 4.2 11/11/2014 0404   CL 101 11/11/2014 0404   CO2 24 11/11/2014 0404   GLUCOSE 367* 11/11/2014 0404  BUN 60* 11/11/2014 0404   CREATININE 4.98* 11/11/2014 0404   CALCIUM 8.0* 11/11/2014 0404   PROT 6.1 11/09/2014 0252   ALBUMIN 2.3* 11/09/2014 0252   AST 39* 11/09/2014 0252   ALT 48* 11/09/2014 0252   ALKPHOS 138* 11/09/2014 0252   BILITOT 0.4 11/09/2014 0252   GFRNONAA 9* 11/11/2014 0404   GFRAA 11* 11/11/2014 0404    GFR Estimated Creatinine Clearance: 14.6 mL/min (by C-G formula based on Cr of 4.98).  Recent Labs  11/08/14 2214  LIPASE 45    Recent Labs  11/09/14 0240 11/09/14 0745 11/09/14 1535  TROPONINI 0.03 0.03 0.04*   Invalid input(s): POCBNP No results for input(s): DDIMER in the last 72 hours. No results for input(s): HGBA1C in the last 72 hours.  Recent Labs  11/09/14 0248  CHOL 117  HDL 56  LDLCALC 45  TRIG 82  CHOLHDL 2.1    Recent Labs  11/09/14 0745  TSH 4.519*   No results for input(s): VITAMINB12, FOLATE, FERRITIN, TIBC, IRON, RETICCTPCT in the last 72 hours. Coags:  Recent Labs  11/09/14 0240  INR 1.21   Microbiology: No results found for this or any previous visit (from the past 240 hour(s)).   BRIEF HOSPITAL COURSE:  Uncontrolled type 2 diabetes mellitus with hyperosmolar nonketotic hyperglycemia: Suspect secondary to noncompliance to insulin. Presented with CBGs of more than 1000. Started on insulin infusion along with IV fluids. Once CBGs were in range, she was transitioned to Lantus and SSI. We will discharge on Lantus 36 units and continue SSI. Further optimization deferred to the outpatient setting to be done by her PCP.  Last A1c 9.8 on January 2016.  Active Problems:  Nausea with vomiting: Suspected gastroparesis or viral syndrome.Resolved, will change to Reglan to  prn. Diet has been advanced to regular-she has been tolerating  ?Right leg weakness: claims she noticed when she woke up at 5 am. This MD rounded on patient around 8:50 am. RUE has a fractured humerus-therefore painful-but strength seems ok, no weakness in RLE noted on my exam. Doubt CVA-but patient has many risk factors, therefore CT head done was neg, this was followed by MRI of the brain which was negative as well. Since very low suspicion for TIA/EIA, defer further workup at this time. Continue with aspirin.    Anemia: Secondary to underlying chronic kidney disease. Suspect drop in hemoglobin is secondary to acute illness and IV fluids dilation. Transfused 1 unit of PRBC-please note patient is a Jehovah witness-in the past has refused transfusion- however she consented.Hb 7.6 this am.   Acute on chronic kidney disease stage IV: Acute renal failure likely secondary to prerenal azotemia from dehydration/uncontrolled diabetes.Creatinine down trending. Claims to follow with a renal MD as outpatient-and claims that she does not want to go on HD. At this point lytes stable, no indication for HD. Patient instructed to follow-up with her primary nephrologist in the next 1-2 weeks.   Chronic combined systolic and diastolic heart failure: Currently compensated. Will resume Lasix on discharge.   Hypertension: Continue amlodipine, Coreg, clonidine. Further optimization deferred to the outpatient setting.   Seizure disorder: Continue Keppra   History of right supracondylar humerus fracture: Followed in the outpatient setting by orthopedics.   Chronic pain syndrome: Difficult situation, given suspicion for gastroparesis-need to minimize narcotics-however since she has chronic narcotic use-stopping narcotics may cause withdrawal symptoms. She also has a right arm fracture for the past 6 months. She apparently has an appointment with the pain clinic on  May 13, and will be provided only 30 tablets of  Percocet on discharge. Further narcotic prescriptions will be deferred to her primary care practitioner    History of hepatitis C: Further follow-up/workup/treatment deferred to the outpatient setting   TODAY-DAY OF DISCHARGE:  Subjective:   [name] today has no headache,no chest abdominal pain,no nausea, vomiting.  Objective:   Blood pressure 189/96, pulse 77, temperature 98 F (36.7 C), temperature source Oral, resp. rate 18, height 5\' 4"  (1.626 m), weight 85.4 kg (188 lb 4.4 oz), SpO2 98 %.  Intake/Output Summary (Last 24 hours) at 11/11/14 0944 Last data filed at 11/10/14 1933  Gross per 24 hour  Intake    240 ml  Output    350 ml  Net   -110 ml   Filed Weights   11/09/14 1401 11/10/14 0528 11/11/14 0517  Weight: 83.5 kg (184 lb 1.4 oz) 84 kg (185 lb 3 oz) 85.4 kg (188 lb 4.4 oz)    Exam Awake Alert, Oriented *3, No new F.N deficits, Normal affect [place].AT,PERRAL Supple Neck,No JVD, No cervical lymphadenopathy appriciated.  Symmetrical Chest wall movement, Good air movement bilaterally, CTAB RRR,No Gallops,Rubs or new Murmurs, No Parasternal Heave +ve B.Sounds, Abd Soft, Non tender, No organomegaly appriciated, No rebound -guarding or rigidity. No Cyanosis, Clubbing or edema, No new Rash or bruise  DISCHARGE CONDITION: Stable  DISPOSITION: Home with home health services  DISCHARGE INSTRUCTIONS:    Activity:  As tolerated with Full fall precautions use walker/cane & assistance as needed  Diet recommendation: Diabetic Diet Heart Healthy diet  Discharge Instructions    Diet - low sodium heart healthy    Complete by:  As directed      Diet Carb Modified    Complete by:  As directed      Increase activity slowly    Complete by:  As directed            Follow-up Information    Follow up with WEBB, CAROL D, MD. Schedule an appointment as soon as possible for a visit in 1 week.   Specialty:  Family Medicine   Contact information:   [place] [id number] [phone]       Total Time spent on discharge equals 45 minutes.  SignedOren Binet 11/11/2014 9:44 AM

## 2014-11-13 LAB — LEVETIRACETAM LEVEL: Levetiracetam Lvl: NOT DETECTED ug/mL (ref 10.0–40.0)

## 2014-11-18 ENCOUNTER — Ambulatory Visit (HOSPITAL_COMMUNITY)
Admission: RE | Admit: 2014-11-18 | Discharge: 2014-11-18 | Disposition: A | Discharge status: Home / Self Care | Payer: Medicare Other | Source: Ambulatory Visit | Attending: Nephrology | Admitting: Nephrology

## 2014-11-18 DIAGNOSIS — N184 Chronic kidney disease, stage 4 (severe): Secondary | ICD-10-CM | POA: Insufficient documentation

## 2014-11-18 DIAGNOSIS — D631 Anemia in chronic kidney disease: Secondary | ICD-10-CM | POA: Diagnosis not present

## 2014-11-18 DIAGNOSIS — Z79899 Other long term (current) drug therapy: Secondary | ICD-10-CM | POA: Diagnosis not present

## 2014-11-18 DIAGNOSIS — Z5181 Encounter for therapeutic drug level monitoring: Secondary | ICD-10-CM | POA: Insufficient documentation

## 2014-11-18 LAB — POCT HEMOGLOBIN-HEMACUE: HEMOGLOBIN: 8.6 g/dL — AB (ref 12.0–15.0)

## 2014-11-18 MED ORDER — SODIUM CHLORIDE 0.9 % IV SOLN
510.0000 mg | INTRAVENOUS | Status: DC
  Administered 2014-11-18: 510 mg via INTRAVENOUS
  Filled 2014-11-18: qty 17

## 2014-11-18 MED ORDER — DARBEPOETIN ALFA 100 MCG/0.5ML IJ SOSY: 100.0000 ug | PREFILLED_SYRINGE | INTRAMUSCULAR | Status: DC

## 2014-11-18 MED ORDER — DARBEPOETIN ALFA 100 MCG/0.5ML IJ SOSY
PREFILLED_SYRINGE | INTRAMUSCULAR | Status: AC
  Administered 2014-11-18: 100 ug via SUBCUTANEOUS
  Filled 2014-11-18: qty 0.5

## 2014-11-22 ENCOUNTER — Observation Stay (HOSPITAL_COMMUNITY)
Admission: EM | Admit: 2014-11-22 | Discharge: 2014-11-24 | Disposition: A | Discharge status: Home / Self Care | Payer: Medicare Other | Attending: Internal Medicine | Admitting: Internal Medicine

## 2014-11-22 ENCOUNTER — Emergency Department (HOSPITAL_COMMUNITY): Payer: Medicare Other

## 2014-11-22 ENCOUNTER — Observation Stay (HOSPITAL_COMMUNITY): Payer: Medicare Other

## 2014-11-22 ENCOUNTER — Encounter (HOSPITAL_COMMUNITY): Payer: Self-pay

## 2014-11-22 DIAGNOSIS — F319 Bipolar disorder, unspecified: Secondary | ICD-10-CM | POA: Diagnosis not present

## 2014-11-22 DIAGNOSIS — G319 Degenerative disease of nervous system, unspecified: Secondary | ICD-10-CM | POA: Insufficient documentation

## 2014-11-22 DIAGNOSIS — G40909 Epilepsy, unspecified, not intractable, without status epilepticus: Secondary | ICD-10-CM

## 2014-11-22 DIAGNOSIS — H538 Other visual disturbances: Secondary | ICD-10-CM | POA: Diagnosis not present

## 2014-11-22 DIAGNOSIS — E1140 Type 2 diabetes mellitus with diabetic neuropathy, unspecified: Secondary | ICD-10-CM | POA: Diagnosis not present

## 2014-11-22 DIAGNOSIS — N179 Acute kidney failure, unspecified: Secondary | ICD-10-CM | POA: Insufficient documentation

## 2014-11-22 DIAGNOSIS — H409 Unspecified glaucoma: Secondary | ICD-10-CM | POA: Diagnosis not present

## 2014-11-22 DIAGNOSIS — K219 Gastro-esophageal reflux disease without esophagitis: Secondary | ICD-10-CM | POA: Insufficient documentation

## 2014-11-22 DIAGNOSIS — I10 Essential (primary) hypertension: Secondary | ICD-10-CM | POA: Diagnosis present

## 2014-11-22 DIAGNOSIS — N185 Chronic kidney disease, stage 5: Secondary | ICD-10-CM | POA: Diagnosis not present

## 2014-11-22 DIAGNOSIS — D649 Anemia, unspecified: Secondary | ICD-10-CM | POA: Diagnosis present

## 2014-11-22 DIAGNOSIS — I5042 Chronic combined systolic (congestive) and diastolic (congestive) heart failure: Secondary | ICD-10-CM | POA: Diagnosis present

## 2014-11-22 DIAGNOSIS — Z791 Long term (current) use of non-steroidal anti-inflammatories (NSAID): Secondary | ICD-10-CM | POA: Diagnosis not present

## 2014-11-22 DIAGNOSIS — E1165 Type 2 diabetes mellitus with hyperglycemia: Secondary | ICD-10-CM | POA: Diagnosis not present

## 2014-11-22 DIAGNOSIS — R5383 Other fatigue: Secondary | ICD-10-CM | POA: Diagnosis present

## 2014-11-22 DIAGNOSIS — N189 Chronic kidney disease, unspecified: Secondary | ICD-10-CM

## 2014-11-22 DIAGNOSIS — E785 Hyperlipidemia, unspecified: Secondary | ICD-10-CM | POA: Diagnosis not present

## 2014-11-22 DIAGNOSIS — Z87891 Personal history of nicotine dependence: Secondary | ICD-10-CM | POA: Diagnosis not present

## 2014-11-22 DIAGNOSIS — G894 Chronic pain syndrome: Secondary | ICD-10-CM | POA: Diagnosis not present

## 2014-11-22 DIAGNOSIS — I120 Hypertensive chronic kidney disease with stage 5 chronic kidney disease or end stage renal disease: Secondary | ICD-10-CM | POA: Insufficient documentation

## 2014-11-22 DIAGNOSIS — Z8673 Personal history of transient ischemic attack (TIA), and cerebral infarction without residual deficits: Secondary | ICD-10-CM | POA: Diagnosis not present

## 2014-11-22 DIAGNOSIS — I639 Cerebral infarction, unspecified: Secondary | ICD-10-CM

## 2014-11-22 DIAGNOSIS — B182 Chronic viral hepatitis C: Secondary | ICD-10-CM | POA: Insufficient documentation

## 2014-11-22 DIAGNOSIS — D631 Anemia in chronic kidney disease: Principal | ICD-10-CM | POA: Insufficient documentation

## 2014-11-22 DIAGNOSIS — Z794 Long term (current) use of insulin: Secondary | ICD-10-CM | POA: Insufficient documentation

## 2014-11-22 DIAGNOSIS — D509 Iron deficiency anemia, unspecified: Secondary | ICD-10-CM | POA: Diagnosis present

## 2014-11-22 DIAGNOSIS — E119 Type 2 diabetes mellitus without complications: Secondary | ICD-10-CM

## 2014-11-22 DIAGNOSIS — H5460 Unqualified visual loss, one eye, unspecified: Secondary | ICD-10-CM | POA: Diagnosis not present

## 2014-11-22 LAB — I-STAT CG4 LACTIC ACID, ED
LACTIC ACID, VENOUS: 1.33 mmol/L (ref 0.5–2.0)
Lactic Acid, Venous: 0.69 mmol/L (ref 0.5–2.0)

## 2014-11-22 LAB — COMPREHENSIVE METABOLIC PANEL
ALT: 63 U/L — ABNORMAL HIGH (ref 0–35)
AST: 67 U/L — AB (ref 0–37)
Albumin: 2.3 g/dL — ABNORMAL LOW (ref 3.5–5.2)
Alkaline Phosphatase: 124 U/L — ABNORMAL HIGH (ref 39–117)
Anion gap: 13 (ref 5–15)
BUN: 93 mg/dL — AB (ref 6–23)
CALCIUM: 8.3 mg/dL — AB (ref 8.4–10.5)
CO2: 22 mmol/L (ref 19–32)
CREATININE: 6.03 mg/dL — AB (ref 0.50–1.10)
Chloride: 92 mmol/L — ABNORMAL LOW (ref 96–112)
GFR calc Af Amer: 9 mL/min — ABNORMAL LOW (ref >90)
GFR calc non Af Amer: 7 mL/min — ABNORMAL LOW (ref >90)
Glucose, Bld: 394 mg/dL — ABNORMAL HIGH (ref 70–99)
Potassium: 4.1 mmol/L (ref 3.5–5.1)
Sodium: 127 mmol/L — ABNORMAL LOW (ref 135–145)
Total Bilirubin: 0.7 mg/dL (ref 0.3–1.2)
Total Protein: 5.9 g/dL — ABNORMAL LOW (ref 6.0–8.3)

## 2014-11-22 LAB — CBC
HEMATOCRIT: 22.7 % — AB (ref 36.0–46.0)
Hemoglobin: 7.6 g/dL — ABNORMAL LOW (ref 12.0–15.0)
MCH: 28.7 pg (ref 26.0–34.0)
MCHC: 33.5 g/dL (ref 30.0–36.0)
MCV: 85.7 fL (ref 78.0–100.0)
Platelets: 172 10*3/uL (ref 150–400)
RBC: 2.65 MIL/uL — ABNORMAL LOW (ref 3.87–5.11)
RDW: 14.6 % (ref 11.5–15.5)
WBC: 3.8 10*3/uL — ABNORMAL LOW (ref 4.0–10.5)

## 2014-11-22 LAB — DIFFERENTIAL
BASOS ABS: 0 10*3/uL (ref 0.0–0.1)
BASOS PCT: 0 % (ref 0–1)
Eosinophils Absolute: 0.1 10*3/uL (ref 0.0–0.7)
Eosinophils Relative: 2 % (ref 0–5)
Lymphocytes Relative: 41 % (ref 12–46)
Lymphs Abs: 1.6 10*3/uL (ref 0.7–4.0)
Monocytes Absolute: 0.3 10*3/uL (ref 0.1–1.0)
Monocytes Relative: 8 % (ref 3–12)
Neutro Abs: 1.9 10*3/uL (ref 1.7–7.7)
Neutrophils Relative %: 49 % (ref 43–77)

## 2014-11-22 LAB — I-STAT TROPONIN, ED: TROPONIN I, POC: 0.01 ng/mL (ref 0.00–0.08)

## 2014-11-22 LAB — GLUCOSE, CAPILLARY
GLUCOSE-CAPILLARY: 282 mg/dL — AB (ref 70–99)
Glucose-Capillary: 342 mg/dL — ABNORMAL HIGH (ref 70–99)

## 2014-11-22 LAB — APTT: APTT: 26 s (ref 24–37)

## 2014-11-22 LAB — PROTIME-INR
INR: 1.15 (ref 0.00–1.49)
Prothrombin Time: 14.8 seconds (ref 11.6–15.2)

## 2014-11-22 LAB — MRSA PCR SCREENING: MRSA BY PCR: NEGATIVE

## 2014-11-22 LAB — POC OCCULT BLOOD, ED: Fecal Occult Bld: POSITIVE — AB

## 2014-11-22 LAB — CBG MONITORING, ED: GLUCOSE-CAPILLARY: 305 mg/dL — AB (ref 70–99)

## 2014-11-22 MED ORDER — CLONIDINE HCL 0.2 MG PO TABS
0.2000 mg | ORAL_TABLET | Freq: Every day | ORAL | Status: DC
  Administered 2014-11-22 – 2014-11-23 (×2): 0.2 mg via ORAL
  Filled 2014-11-22 (×3): qty 1

## 2014-11-22 MED ORDER — OXYCODONE-ACETAMINOPHEN 5-325 MG PO TABS
1.0000 | ORAL_TABLET | Freq: Four times a day (QID) | ORAL | Status: DC | PRN
Start: 2014-11-22 — End: 2014-11-22
  Administered 2014-11-22: 1 via ORAL
  Filled 2014-11-22: qty 1

## 2014-11-22 MED ORDER — OXYCODONE-ACETAMINOPHEN 5-325 MG PO TABS
2.0000 | ORAL_TABLET | Freq: Four times a day (QID) | ORAL | Status: DC | PRN
  Administered 2014-11-22 – 2014-11-24 (×3): 2 via ORAL
  Filled 2014-11-22 (×3): qty 2

## 2014-11-22 MED ORDER — HEPARIN SODIUM (PORCINE) 5000 UNIT/ML IJ SOLN
5000.0000 [IU] | Freq: Three times a day (TID) | INTRAMUSCULAR | Status: DC
  Administered 2014-11-22 – 2014-11-24 (×6): 5000 [IU] via SUBCUTANEOUS
  Filled 2014-11-22 (×8): qty 1

## 2014-11-22 MED ORDER — FUROSEMIDE 80 MG PO TABS
80.0000 mg | ORAL_TABLET | Freq: Two times a day (BID) | ORAL | Status: DC
  Administered 2014-11-22 – 2014-11-24 (×4): 80 mg via ORAL
  Filled 2014-11-22 (×6): qty 1

## 2014-11-22 MED ORDER — HYDRALAZINE HCL 20 MG/ML IJ SOLN: 5.0000 mg | INTRAMUSCULAR | Status: DC | PRN

## 2014-11-22 MED ORDER — INSULIN ASPART 100 UNIT/ML ~~LOC~~ SOLN
0.0000 [IU] | Freq: Every day | SUBCUTANEOUS | Status: DC
  Administered 2014-11-22: 3 [IU] via SUBCUTANEOUS
  Administered 2014-11-23: 4 [IU] via SUBCUTANEOUS

## 2014-11-22 MED ORDER — INSULIN ASPART 100 UNIT/ML ~~LOC~~ SOLN
0.0000 [IU] | Freq: Three times a day (TID) | SUBCUTANEOUS | Status: DC
  Administered 2014-11-22 – 2014-11-23 (×3): 7 [IU] via SUBCUTANEOUS
  Administered 2014-11-23: 5 [IU] via SUBCUTANEOUS
  Administered 2014-11-23: 7 [IU] via SUBCUTANEOUS

## 2014-11-22 MED ORDER — AMLODIPINE BESYLATE 5 MG PO TABS
5.0000 mg | ORAL_TABLET | Freq: Every day | ORAL | Status: DC
  Administered 2014-11-22 – 2014-11-24 (×3): 5 mg via ORAL
  Filled 2014-11-22 (×3): qty 1

## 2014-11-22 MED ORDER — SODIUM CHLORIDE 0.9 % IV BOLUS (SEPSIS)
1000.0000 mL | Freq: Once | INTRAVENOUS | Status: AC
Start: 2014-11-22 — End: 2014-11-22
  Administered 2014-11-22: 1000 mL via INTRAVENOUS

## 2014-11-22 MED ORDER — FOLIC ACID 400 MCG PO TABS: 200.0000 ug | ORAL_TABLET | Freq: Every morning | ORAL | Status: DC

## 2014-11-22 MED ORDER — ATORVASTATIN CALCIUM 40 MG PO TABS
40.0000 mg | ORAL_TABLET | Freq: Every day | ORAL | Status: DC
Start: 2014-11-22 — End: 2014-11-24
  Administered 2014-11-22 – 2014-11-23 (×2): 40 mg via ORAL
  Filled 2014-11-22 (×3): qty 1

## 2014-11-22 MED ORDER — INSULIN GLARGINE 100 UNIT/ML ~~LOC~~ SOLN
36.0000 [IU] | Freq: Every day | SUBCUTANEOUS | Status: DC
  Administered 2014-11-22: 36 [IU] via SUBCUTANEOUS
  Filled 2014-11-22 (×2): qty 0.36

## 2014-11-22 MED ORDER — CARVEDILOL 25 MG PO TABS
25.0000 mg | ORAL_TABLET | Freq: Two times a day (BID) | ORAL | Status: DC
  Administered 2014-11-22 – 2014-11-24 (×5): 25 mg via ORAL
  Filled 2014-11-22 (×7): qty 1

## 2014-11-22 MED ORDER — FERROUS SULFATE 325 (65 FE) MG PO TABS
325.0000 mg | ORAL_TABLET | Freq: Every day | ORAL | Status: DC
  Administered 2014-11-23 – 2014-11-24 (×2): 325 mg via ORAL
  Filled 2014-11-22 (×3): qty 1

## 2014-11-22 MED ORDER — METOCLOPRAMIDE HCL 10 MG PO TABS
10.0000 mg | ORAL_TABLET | Freq: Three times a day (TID) | ORAL | Status: DC | PRN
  Administered 2014-11-23: 10 mg via ORAL
  Filled 2014-11-22: qty 1

## 2014-11-22 MED ORDER — ADULT MULTIVITAMIN W/MINERALS CH
1.0000 | ORAL_TABLET | Freq: Every day | ORAL | Status: DC
  Administered 2014-11-22 – 2014-11-24 (×3): 1 via ORAL
  Filled 2014-11-22 (×3): qty 1

## 2014-11-22 MED ORDER — ACETAMINOPHEN 500 MG PO TABS: 500.0000 mg | ORAL_TABLET | Freq: Four times a day (QID) | ORAL | Status: DC | PRN

## 2014-11-22 NOTE — ED Notes (Signed)
Pt. Reports that she has been having rt. Eye vision changes and rt. Side headache.  She also reports having bilateral leg heaviness since she has had anemia.  Pt. Went to bed last night at 22:00 and woke up at 1:00 am and felt that her rt. Eye was blurred.  She placed eye drops into her rt. Eye and again woke up with feeling that her rt. Eye vision was getting smaller.  Upon arrival to ED,  She is alert and oriented X4.  She is able to move all extremeties and she moved over from stretcher to stretcher.  Dr. Christy Gentles assessed pt. Prior to going to the Ct scan, Airway patent.

## 2014-11-22 NOTE — ED Provider Notes (Signed)
CSN: [id number]     Arrival date & time 11/22/14  [id number] History   First MD Initiated Contact with Patient 11/22/14 [phone]     Chief Complaint  Patient presents with  . Code Stroke    An emergency department physician performed an initial assessment on this suspected stroke patient at 0730. (Consider location/radiation/quality/duration/timing/severity/associated sxs/prior Treatment) Patient is a 49 y.o. female presenting with eye problem. The history is provided by the patient.  Eye Problem Location:  R eye Severity:  Moderate Onset quality:  Gradual Timing:  Constant Progression:  Worsening Chronicity:  Recurrent Context: not scratch and not tanning booth use   Relieved by:  Nothing Worsened by:  Nothing tried Associated symptoms: decreased vision ("everything is going white")   Associated symptoms: no nausea, no swelling and no vomiting     Past Medical History  Diagnosis Date  . Neuropathy   . Glaucoma   . Bipolar 1 disorder   . Refusal of blood transfusions as patient is Jehovah's Witness   . Shortness of breath   . TIA (transient ischemic attack) 2010  . Neuromuscular disorder     DIABETIC NEUROPATHY  . MRSA infection ?2014    "on the back of my head; spread throughout my bloodstream"  . Bipolar disorder, unspecified 02/06/2014  . High cholesterol   . Type II diabetes mellitus     INSULIN DEPENDENT  . Hepatitis C   . Chronic pain syndrome   . Chronic kidney disease (CKD), stage III (moderate)   . Schizoaffective disorder, unspecified condition 02/08/2014  . Seizure dx'd 2014    "don't know what kind; last one was ~ 04/2014"  . Chronic combined systolic and diastolic CHF (congestive heart failure)     EF 40% by echo and 48% by stress test  . Anemia   . Hypertension   . Stroke     hx TIA  . Neuromuscular disorder     diabetic neuropathy  . Shortness of breath dyspnea   . Schizophrenia   . Seizures   . Hepatitis     hep C  . Bipolar disorder   . Glaucoma   .  Chronic pain   . Diabetes mellitus without complication     on Insulin  . Depression   . Anxiety   . Chronic kidney disease     chronic kidney stage 3 Dr Mercy Moore  . GERD (gastroesophageal reflux disease)   . History of hiatal hernia   . Headache     sinus headaches   Past Surgical History  Procedure Laterality Date  . Amputation Left 05/21/2013    Procedure: Left Midfoot AMPUTATION;  Surgeon: Newt Minion, MD;  Location: [facility];  Service: Orthopedics;  Laterality: Left;  Left Midfoot Amputation  . Tubal ligation    . Colonoscopy with propofol N/A 06/22/2013    Procedure: COLONOSCOPY WITH PROPOFOL;  Surgeon: Jeryl Columbia, MD;  Location: [facility];  Service: Endoscopy;  Laterality: N/A;  . Abdominal hysterectomy  2014  . Tonsillectomy  1970's  . Refractive surgery Bilateral 2013  . Tubal ligation  2000  . Abdominal hysterectomy  2010  . Foot amputation Left     due diabetic neuropathy, could not feel ulcer on bottom of foot  . Eye surgery Bilateral 2010    Lasik   Family History  Problem Relation Age of Onset  . Hypertension Mother   . Hypertension Father    History  Substance Use Topics  . Smoking status: Former Smoker --  1.00 packs/day for 20 years    Types: Cigarettes    Quit date: 11/05/2014  . Smokeless tobacco: Never Used     Comment: "stopped smoking in ~ 2013; restarted last week; stopped again 06/17/2014"  . Alcohol Use: Yes     Comment: 3 - 40's q week   OB History    Gravida Para Term Preterm AB TAB SAB Ectopic Multiple Living   0 0 0 0 0 0 0 0       Review of Systems  Constitutional: Positive for fatigue. Negative for fever.  Respiratory: Negative for cough and shortness of breath.   Gastrointestinal: Negative for nausea and vomiting.  All other systems reviewed and are negative.     Allergies  Gabapentin; Gabapentin; Lyrica; Penicillins; Saphris; Saphris; Tramadol; Tramadol; and Penicillins  Home Medications   Prior to Admission  medications   Medication Sig Start Date End Date Taking? Authorizing Provider  acetaminophen (TYLENOL) 500 MG tablet Take 500 mg by mouth every 6 (six) hours as needed for mild pain or moderate pain.   Yes Historical Provider, MD  amLODipine (NORVASC) 5 MG tablet Take 5 mg by mouth daily.  09/15/14  Yes Historical Provider, MD  aspirin EC 325 MG EC tablet Take 1 tablet (325 mg total) by mouth daily. 08/27/14  Yes Geradine Girt, DO  atorvastatin (LIPITOR) 40 MG tablet Take 1 tablet (40 mg total) by mouth daily at 6 PM. 11/11/14  Yes Shanker Kristeen Mans, MD  carvedilol (COREG) 25 MG tablet Take 1 tablet (25 mg total) by mouth 2 (two) times daily. 06/24/14  Yes Debbe Odea, MD  cloNIDine (CATAPRES) 0.2 MG tablet Take 0.2 mg by mouth at bedtime.  08/28/14  Yes Historical Provider, MD  Fe Bisgly-Vit C-Vit B12-FA 28-60-0.008-0.4 MG CAPS Take 1 tablet by mouth daily.   Yes Historical Provider, MD  ferrous sulfate 325 (65 FE) MG tablet Take 1 tablet (325 mg total) by mouth daily with breakfast. 06/24/14  Yes Debbe Odea, MD  folic acid (FOLVITE) A999333 MCG tablet Take 200 mcg by mouth every morning.    Yes Historical Provider, MD  furosemide (LASIX) 40 MG tablet Take 2 tablets (80 mg total) by mouth 2 (two) times daily. 08/09/14  Yes Larene Pickett, PA-C  insulin aspart (NOVOLOG) 100 UNIT/ML injection Inject 2-10 Units into the skin 3 (three) times daily before meals. SSI: 0-200 = 2 units, 201-250 = 4 units, 251-300 = 6 units, 301-350 = 8 units, 351-400 = 10 units   Yes Historical Provider, MD  insulin glargine (LANTUS) 100 UNIT/ML injection Inject 0.36 mLs (36 Units total) into the skin daily. Patient taking differently: Inject 36 Units into the skin at bedtime.  11/11/14  Yes Shanker Kristeen Mans, MD  Lactobacillus (ACIDOPHILUS) 100 MG CAPS Take 100 mg by mouth daily.   Yes Historical Provider, MD  metoCLOPramide (REGLAN) 10 MG tablet Take 1 tablet (10 mg total) by mouth every 8 (eight) hours as needed for nausea.  11/11/14  Yes Shanker Kristeen Mans, MD  Multiple Vitamin (MULTIVITAMIN WITH MINERALS) TABS tablet Take 1 tablet by mouth daily.   Yes Historical Provider, MD  mupirocin cream (BACTROBAN) 2 % Apply topically daily. Patient taking differently: Apply 1 application topically daily as needed (itching, skin irritation).  08/27/14  Yes Geradine Girt, DO  naproxen sodium (ANAPROX) 220 MG tablet Take 220 mg by mouth daily as needed (pain).   Yes Historical Provider, MD  oxyCODONE-acetaminophen (PERCOCET/ROXICET) 5-325 MG per tablet Take  1 tablet by mouth every 6 (six) hours as needed for moderate pain. 11/11/14  Yes Shanker Kristeen Mans, MD  polyethylene glycol (MIRALAX / GLYCOLAX) packet Take 17 g by mouth 2 (two) times daily. Mix in 8 oz liquid and drink 08/24/14  Yes Historical Provider, MD   BP 168/87 mmHg  Pulse 103  Temp(Src) 98.3 F (36.8 C) (Oral)  Resp 15  SpO2 100% Physical Exam  Constitutional: She is oriented to person, place, and time. She appears well-developed and well-nourished. No distress.  HENT:  Head: Normocephalic and atraumatic.  Mouth/Throat: Oropharynx is clear and moist.  Eyes: EOM are normal. Pupils are equal, round, and reactive to light.  Neck: Normal range of motion. Neck supple.  Cardiovascular: Normal rate and regular rhythm.  Exam reveals no friction rub.   No murmur heard. Pulmonary/Chest: Effort normal and breath sounds normal. No respiratory distress. She has no wheezes. She has no rales.  Abdominal: Soft. She exhibits no distension. There is no tenderness. There is no rebound.  Musculoskeletal: Normal range of motion. She exhibits no edema.  Neurological: She is alert and oriented to person, place, and time.  Skin: No rash noted. She is not diaphoretic.  Nursing note and vitals reviewed.   ED Course  Procedures (including critical care time) Labs Review Labs Reviewed  PROTIME-INR  APTT  CBC  DIFFERENTIAL  COMPREHENSIVE METABOLIC PANEL  I-STAT TROPOININ, ED     Imaging Review Ct Head Wo Contrast  11/22/2014   CLINICAL DATA:  Code stroke. Leg weakness and heaviness. Right-sided headache. Abnormal vision right eye.  EXAM: CT HEAD WITHOUT CONTRAST  TECHNIQUE: Contiguous axial images were obtained from the base of the skull through the vertex without intravenous contrast.  COMPARISON:  MRI 11/10/2014.  CT 11/10/2014  FINDINGS: Symmetric enlargement of the sylvian fissure bilaterally compatible with significant atrophy. This is unchanged. Negative for hydrocephalus. Benign cyst in the left basal ganglia stable. Mild chronic microvascular ischemic changes in the white matter. Chronic encephalomalacia left occipital lobe unchanged.  Negative for acute infarct. Negative for hemorrhage or mass. No change from prior studies.  IMPRESSION: Atrophy and chronic microvascular ischemic changes. No acute abnormality and no change from the prior exams.  Critical Value/emergent results were called by telephone at the time of interpretation on 11/22/2014 at 7:52 am to Dr. Jim Like , who verbally acknowledged these results.   Electronically Signed   By: Franchot Gallo M.D.   On: 11/22/2014 07:52     EKG Interpretation   Date/Time:  Tuesday November 22 2014 07:55:48 EDT Ventricular Rate:  103 PR Interval:  165 QRS Duration: 89 QT Interval:  362 QTC Calculation: 474 R Axis:   49 Text Interpretation:  Sinus tachycardia No significant change since last  tracing Confirmed by Mingo Amber  MD, [place] ([id number]) on 11/22/2014 10:54:55 AM      MDM   Final diagnoses:  Fatigue  Anemia, unspecified anemia type  Acute on chronic renal failure    36F here with decreased vision in the right eye. Described as, "white out vision". History of this before in [place], but it was both eyes. She states it was due to medication she was on. She is not currently taking that medication, but she cannot room or the name either. She was seen as a code stroke by Dr. Janann Colonel. He believes that she  does not have a stroke and canceled the code stroke activation. Head CT is normal. She does report some generalized fatigue, similar to when  she needed a blood transfusion. She does have a history of iron deficiency. Hgb 4 days ago was 8.6. Today it is 7.6.  BMP shows creatinine of 6.03. She is hyponatremic down to 127. Creatinine is worse than her previous admission. Hemoglobin of 7.6 is low for her. Last time she was transient issue 7.1. She has bright red blood on stool exam, however she has active hemorrhoids.  Will consult [facility] for admission.  Of note, she is accepting blood transfusions. She has history of being a Jehovah's witness.  Evelina Bucy, MD 11/22/14 1055

## 2014-11-22 NOTE — ED Notes (Signed)
Pt arrived to room from CT.

## 2014-11-22 NOTE — ED Notes (Signed)
Pt undressed, in gown, on monitor, continuous pulse oximetry and blood pressure cuff 

## 2014-11-22 NOTE — H&P (Signed)
[facility] History and Physical                                                                                    [name], is a 49 y.o. female  MRN: [id number]   DOB - July 15, 1966  Admit Date - 11/22/2014  Outpatient Primary MD for the patient is WEBB, Valla Leaver, MD  With History of -  Past Medical History  Diagnosis Date  . Neuropathy   . Glaucoma   . Bipolar 1 disorder   . Refusal of blood transfusions as patient is Jehovah's Witness   . Shortness of breath   . TIA (transient ischemic attack) 2010  . Neuromuscular disorder     DIABETIC NEUROPATHY  . MRSA infection ?2014    "on the back of my head; spread throughout my bloodstream"  . Bipolar disorder, unspecified 02/06/2014  . High cholesterol   . Type II diabetes mellitus     INSULIN DEPENDENT  . Hepatitis C   . Chronic pain syndrome   . Chronic kidney disease (CKD), stage III (moderate)   . Schizoaffective disorder, unspecified condition 02/08/2014  . Seizure dx'd 2014    "don't know what kind; last one was ~ 04/2014"  . Chronic combined systolic and diastolic CHF (congestive heart failure)     EF 40% by echo and 48% by stress test  . Anemia   . Hypertension   . Stroke     hx TIA  . Neuromuscular disorder     diabetic neuropathy  . Shortness of breath dyspnea   . Schizophrenia   . Seizures   . Hepatitis     hep C  . Bipolar disorder   . Glaucoma   . Chronic pain   . Diabetes mellitus without complication     on Insulin  . Depression   . Anxiety   . Chronic kidney disease     chronic kidney stage 3 Dr Mercy Moore  . GERD (gastroesophageal reflux disease)   . History of hiatal hernia   . Headache     sinus headaches      Past Surgical History  Procedure Laterality Date  . Amputation Left 05/21/2013    Procedure: Left Midfoot AMPUTATION;  Surgeon: Newt Minion, MD;  Location: [facility];  Service: Orthopedics;  Laterality: Left;  Left Midfoot Amputation  . Tubal ligation    . Colonoscopy with  propofol N/A 06/22/2013    Procedure: COLONOSCOPY WITH PROPOFOL;  Surgeon: Jeryl Columbia, MD;  Location: [facility];  Service: Endoscopy;  Laterality: N/A;  . Abdominal hysterectomy  2014  . Tonsillectomy  1970's  . Refractive surgery Bilateral 2013  . Tubal ligation  2000  . Abdominal hysterectomy  2010  . Foot amputation Left     due diabetic neuropathy, could not feel ulcer on bottom of foot  . Eye surgery Bilateral 2010    Lasik    in for   Chief Complaint  Patient presents with  . Code Stroke     HPI 49 year old female patient with multiple medical problems including chronic kidney disease stage V with associated chronic anemia with iron deficiency component. She also  has diabetes mellitus and is on insulin, hypertension, bipolar disorder and schizoaffective disorder, hepatitis C, chronic pain and history of prior TIA. Patient persisted to the ER reports of acute onset of blurred vision right eye and gait instability. She reports she was last normal yesterday evening around 10 PM when she went to bed. She initially awakened at 1 AM and noticed blurred vision in the right eye and felt that the visual field was narrowing. She went back to bed and when she awakened this morning her vision was worse. She reported difficulty walking related to bilateral leg heaviness but reports this is also a chronic issue related to her chronic anemia. At time of presentation to the ER the patient's systolic blood pressure was greater than 200 and her CBG was 468.  Since arrival to the ER patient has been evaluated by neurology. P CT of the head was unremarkable. Neurology reports no further stroke workup indicated at this time. Recommended considering ophthalmologically evaluation for unilateral blurred vision. Additional evaluation by the ER physician revealed slightly worsened anemia. Renal function was also slightly worsened with a BUN of 93 and a creatinine of 6.3 where one week prior creatinine had  been 4.98 and a BUN of 60. Hemoglobin was also lower at 7.6. And although her stool was brown and it was Hemoccult positive. Patient also had hyponatremia with a sodium of 127. Her lactic acid was normal at 1.33 and troponin was normal at 0.01. Her chest x-ray did not demonstrate any heart failure. In addition to the above complaints patient is also reporting generalized fatigue with ambulation without shortness of breath or chest pain. He denies orthopnea or dyspnea on exertion. She's not had any black or bloody stools. She she does report darker stools in the setting of taking oral iron regularly. She reports she is scheduled to see Dr. Mercy Moore next week.  Review of Systems   In addition to the HPI above,  No Fever-chills or other constitutional symptoms No Headache, changes with hearing, new weakness, tingling, numbness in any extremity, No problems swallowing food or Liquids, indigestion/reflux No Chest pain, Cough or Shortness of Breath, palpitations, orthopnea or DOE No Abdominal pain, N/V; no melena or hematochezia, no dark tarry stools, Bowel movements are regular, No dysuria, hematuria or flank pain No new skin rashes, lesions, masses or bruises, No new joints pains-aches No recent weight gain or loss No polyuria, polydypsia or polyphagia,  *A full 10 point Review of Systems was done, except as stated above, all other Review of Systems were negative.  Social History History  Substance Use Topics  . Smoking status: Former Smoker -- 1.00 packs/day for 20 years    Types: Cigarettes    Quit date: 11/05/2014  . Smokeless tobacco: Never Used     Comment: "stopped smoking in ~ 2013; restarted last week; stopped again 06/17/2014"  . Alcohol Use: Yes     Comment: 3 - 40's q week    Family History Family History  Problem Relation Age of Onset  . Hypertension Mother   . Hypertension Father     Prior to Admission medications   Medication Sig Start Date End Date Taking? Authorizing  Provider  acetaminophen (TYLENOL) 500 MG tablet Take 500 mg by mouth every 6 (six) hours as needed for mild pain or moderate pain.   Yes Historical Provider, MD  amLODipine (NORVASC) 5 MG tablet Take 5 mg by mouth daily.  09/15/14  Yes Historical Provider, MD  aspirin EC 325 MG  EC tablet Take 1 tablet (325 mg total) by mouth daily. 08/27/14  Yes Geradine Girt, DO  atorvastatin (LIPITOR) 40 MG tablet Take 1 tablet (40 mg total) by mouth daily at 6 PM. 11/11/14  Yes Shanker Kristeen Mans, MD  carvedilol (COREG) 25 MG tablet Take 1 tablet (25 mg total) by mouth 2 (two) times daily. 06/24/14  Yes Debbe Odea, MD  cloNIDine (CATAPRES) 0.2 MG tablet Take 0.2 mg by mouth at bedtime.  08/28/14  Yes Historical Provider, MD  Fe Bisgly-Vit C-Vit B12-FA 28-60-0.008-0.4 MG CAPS Take 1 tablet by mouth daily.   Yes Historical Provider, MD  ferrous sulfate 325 (65 FE) MG tablet Take 1 tablet (325 mg total) by mouth daily with breakfast. 06/24/14  Yes Debbe Odea, MD  folic acid (FOLVITE) A999333 MCG tablet Take 200 mcg by mouth every morning.    Yes Historical Provider, MD  furosemide (LASIX) 40 MG tablet Take 2 tablets (80 mg total) by mouth 2 (two) times daily. 08/09/14  Yes Larene Pickett, PA-C  insulin aspart (NOVOLOG) 100 UNIT/ML injection Inject 2-10 Units into the skin 3 (three) times daily before meals. SSI: 0-200 = 2 units, 201-250 = 4 units, 251-300 = 6 units, 301-350 = 8 units, 351-400 = 10 units   Yes Historical Provider, MD  insulin glargine (LANTUS) 100 UNIT/ML injection Inject 0.36 mLs (36 Units total) into the skin daily. Patient taking differently: Inject 36 Units into the skin at bedtime.  11/11/14  Yes Shanker Kristeen Mans, MD  Lactobacillus (ACIDOPHILUS) 100 MG CAPS Take 100 mg by mouth daily.   Yes Historical Provider, MD  metoCLOPramide (REGLAN) 10 MG tablet Take 1 tablet (10 mg total) by mouth every 8 (eight) hours as needed for nausea. 11/11/14  Yes Shanker Kristeen Mans, MD  Multiple Vitamin (MULTIVITAMIN WITH  MINERALS) TABS tablet Take 1 tablet by mouth daily.   Yes Historical Provider, MD  mupirocin cream (BACTROBAN) 2 % Apply topically daily. Patient taking differently: Apply 1 application topically daily as needed (itching, skin irritation).  08/27/14  Yes Geradine Girt, DO  naproxen sodium (ANAPROX) 220 MG tablet Take 220 mg by mouth daily as needed (pain).   Yes Historical Provider, MD  oxyCODONE-acetaminophen (PERCOCET/ROXICET) 5-325 MG per tablet Take 1 tablet by mouth every 6 (six) hours as needed for moderate pain. 11/11/14  Yes Shanker Kristeen Mans, MD  polyethylene glycol (MIRALAX / GLYCOLAX) packet Take 17 g by mouth 2 (two) times daily. Mix in 8 oz liquid and drink 08/24/14  Yes Historical Provider, MD    Allergies  Allergen Reactions  . Gabapentin Itching and Swelling  . Gabapentin Swelling  . Lyrica [Pregabalin] Swelling    Facial and leg swelling  . Penicillins Hives    Pt tolerates cefepime and ceftriaxone  . Saphris [Asenapine] Swelling    Swelling of the face  . Saphris [Asenapine] Swelling    Facial swelling  . Tramadol Swelling    Causes legs to swell  . Tramadol Swelling    Leg swelling  . Penicillins Hives    Has tolerated Cefepime and Ceftriaxone.    Physical Exam  Vitals  Blood pressure 174/88, pulse 100, temperature 98.3 F (36.8 C), temperature source Oral, resp. rate 17, height 5\' 4"  (1.626 m), weight 188 lb (85.276 kg), SpO2 100 %.   General:  In no acute distress, appears healthy and well nourished  Psych:  Normal affect, Denies Suicidal or Homicidal ideations, Awake Alert, Oriented X 3. Speech and thought  patterns are clear and appropriate, no apparent short term memory deficits  Neuro:   No focal neurological deficits, CN II through XII intact, Strength 5/5 all 4 extremities, Sensation intact all 4 extremities. Neurology is documented decreased visual acuity peripheral right eye. Patient was having difficulty performing EOM exam basically did not move eyes  at all in an attempt to not move head.  ENT:  Ears and Eyes appear Normal, Conjunctivae clear, PER. Moist oral mucosa without erythema or exudates.  Neck:  Supple, No lymphadenopathy appreciated  Respiratory:  Symmetrical chest wall movement, Good air movement bilaterally, CTAB. Room Air  Cardiac:  RRR, No Murmurs, no LE edema noted, no JVD, No carotid bruits, peripheral pulses palpable at 2+  Abdomen:  Positive bowel sounds, Soft, Non tender, Non distended,  No masses appreciated, no obvious hepatosplenomegaly  Skin:  No Cyanosis, Normal Skin Turgor, No Skin Rash or Bruise.  Extremities: Symmetrical without obvious trauma or injury,  no effusions. Prior left transmetatarsal amputation.  Data Review  CBC  Recent Labs Lab 11/18/14 0854 11/22/14 0734  WBC  --  3.8*  HGB 8.6* 7.6*  HCT  --  22.7*  PLT  --  172  MCV  --  85.7  MCH  --  28.7  MCHC  --  33.5  RDW  --  14.6  LYMPHSABS  --  1.6  MONOABS  --  0.3  EOSABS  --  0.1  BASOSABS  --  0.0    Chemistries   Recent Labs Lab 11/22/14 0734  NA 127*  K 4.1  CL 92*  CO2 22  GLUCOSE 394*  BUN 93*  CREATININE 6.03*  CALCIUM 8.3*  AST 67*  ALT 63*  ALKPHOS 124*  BILITOT 0.7    estimated creatinine clearance is 12 mL/min (by C-G formula based on Cr of 6.03).  No results for input(s): TSH, T4TOTAL, T3FREE, THYROIDAB in the last 72 hours.  Invalid input(s): FREET3  Coagulation profile  Recent Labs Lab 11/22/14 0734  INR 1.15    No results for input(s): DDIMER in the last 72 hours.  Cardiac Enzymes No results for input(s): CKMB, TROPONINI, MYOGLOBIN in the last 168 hours.  Invalid input(s): CK  Invalid input(s): POCBNP  Urinalysis    Component Value Date/Time   COLORURINE YELLOW 11/08/2014 [place] 11/08/2014 2345   LABSPEC 1.020 11/08/2014 2345   PHURINE 7.0 11/08/2014 2345   GLUCOSEU >1000* 11/08/2014 2345   HGBUR MODERATE* 11/08/2014 2345   BILIRUBINUR NEGATIVE 11/08/2014  2345   KETONESUR NEGATIVE 11/08/2014 2345   PROTEINUR 100* 11/08/2014 2345   UROBILINOGEN 0.2 11/08/2014 2345   NITRITE NEGATIVE 11/08/2014 2345   LEUKOCYTESUR NEGATIVE 11/08/2014 2345    Imaging results:   Dg Chest 2 View  11/22/2014   CLINICAL DATA:  Fatigue off and on for 1 year worsening, headache for 2 days, visual changes RIGHT side, history kidney disease, anemia, CHF, diabetes, hypertension, stroke, hepatitis-C  EXAM: CHEST  2 VIEW  COMPARISON:  09/30/2014  FINDINGS: Upper normal heart size.  Mediastinal contours and pulmonary vascularity normal.  Lungs clear.  No pleural effusion or pneumothorax.  Bones unremarkable.  IMPRESSION: No acute abnormalities.   Electronically Signed   By: Lavonia Dana M.D.   On: 11/22/2014 09:20   Ct Head Wo Contrast  11/22/2014   CLINICAL DATA:  Code stroke. Leg weakness and heaviness. Right-sided headache. Abnormal vision right eye.  EXAM: CT HEAD WITHOUT CONTRAST  TECHNIQUE: Contiguous axial images were  obtained from the base of the skull through the vertex without intravenous contrast.  COMPARISON:  MRI 11/10/2014.  CT 11/10/2014  FINDINGS: Symmetric enlargement of the sylvian fissure bilaterally compatible with significant atrophy. This is unchanged. Negative for hydrocephalus. Benign cyst in the left basal ganglia stable. Mild chronic microvascular ischemic changes in the white matter. Chronic encephalomalacia left occipital lobe unchanged.  Negative for acute infarct. Negative for hemorrhage or mass. No change from prior studies.  IMPRESSION: Atrophy and chronic microvascular ischemic changes. No acute abnormality and no change from the prior exams.  Critical Value/emergent results were called by telephone at the time of interpretation on 11/22/2014 at 7:52 am to Dr. Jim Like , who verbally acknowledged these results.   Electronically Signed   By: Franchot Gallo M.D.   On: 11/22/2014 07:52   Ct Head Wo Contrast  11/10/2014   CLINICAL DATA:  Right leg  weakness  EXAM: CT HEAD WITHOUT CONTRAST  TECHNIQUE: Contiguous axial images were obtained from the base of the skull through the vertex without intravenous contrast.  COMPARISON:  11/09/2014  FINDINGS: No skull fracture is noted. Paranasal sinuses and mastoid air cells are unremarkable.  No intracranial hemorrhage, mass effect or midline shift. Again noted old lacunar infarct in left basal ganglia.  Stable mild cerebral atrophy. Stable mild periventricular and patchy subcortical chronic white matter disease. No definite acute cortical infarction. No mass lesion is noted on this unenhanced scan.  IMPRESSION: No definite acute cortical infarction. Stable atrophy and chronic mild white matter disease. 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   Electronically Signed   By: Lahoma Crocker M.D.   On: 11/10/2014 10:20   Ct Head Wo Contrast  11/09/2014   CLINICAL DATA:  Dizziness for 4 days.  Nausea and vomiting.  EXAM: CT HEAD WITHOUT CONTRAST  TECHNIQUE: Contiguous axial images were obtained from the base of the skull through the vertex without intravenous contrast.  COMPARISON:  MRI brain 02/02/2014.  CT head 02/02/2014.  FINDINGS: Diffuse cerebral atrophy. Prominent atrophy along the sylvian fissure bilaterally. Mild ventricular dilatation consistent with central atrophy. Low-attenuation changes in the deep white matter consistent with small vessel ischemia. Old lacunar infarct in the left basal ganglia. No mass effect or midline shift. No abnormal extra-axial fluid collections. Gray-white matter junctions are distinct. Basal cisterns are not effaced. No evidence of acute intracranial hemorrhage. No depressed skull fractures. Visualized paranasal sinuses and mastoid air cells are not opacified.  IMPRESSION: No acute intracranial abnormalities. Chronic atrophy and small vessel ischemic changes.   Electronically Signed   By: Lucienne Capers M.D.   On: 11/09/2014 01:40   Mr Brain Wo Contrast  11/10/2014    CLINICAL DATA:  Seizure disorder. Nausea and vomiting. Prior history of stroke. Initial encounter.  EXAM: MRI HEAD WITHOUT CONTRAST  TECHNIQUE: Multiplanar, multiecho pulse sequences of the brain and surrounding structures were obtained without intravenous contrast.  COMPARISON:  CT head 11/10/2014.  MR head 02/02/2014.  FINDINGS: No evidence for acute infarction, hemorrhage, mass lesion, hydrocephalus, or extra-axial fluid.  Fairly symmetric temporoparietal atrophy with marked prominence of the central sulcus and sylvian fissures. Mild to moderate small vessel disease is stable. Flow voids are maintained throughout the carotid, basilar, and vertebral arteries. There are no areas of chronic hemorrhage. Pituitary, pineal, and cerebellar tonsils unremarkable. No upper cervical lesions. No asymmetric Hippocampal insult or volume loss.  IMPRESSION: Severe atrophy is stable.  No acute intracranial findings.   Electronically Signed  By: Rolla Flatten M.D.   On: 11/10/2014 13:16   US Renal  11/09/2014   CLINICAL DATA:  Initial evaluation for acute renal injury. History of chronic stage III renal disease, hypertension, diabetes, hep C.  EXAM: RENAL/URINARY TRACT ULTRASOUND COMPLETE  COMPARISON:  Prior study from 07/06/2014  FINDINGS: Right Kidney:  Length: 11.1 cm. Renal cortex diffusely echogenic in appearance, suggesting underlying medical renal disease. No mass or hydronephrosis visualized.  Left Kidney:  Length: 11.3 cm. Renal cortex diffusely echogenic in appearance, suggesting underlying medical renal disease. No mass or hydronephrosis visualized.  Bladder:  Appears normal for degree of bladder distention. Bilateral ureteral jets present.  IMPRESSION: Echogenic kidneys bilaterally, suggesting underlying medical renal disease. No hydronephrosis or other acute abnormality.   Electronically Signed   By: Jeannine Boga M.D.   On: 11/09/2014 03:08     EKG: Sinus tachycardia   Assessment & Plan  Principal  Problem:   Symptomatic anemia/anemia in CKD/iron deficiency anemia -Admit to medical floor observation status -Suspect lower hemoglobin more reflective of hemodilution state and progressive kidney disease -EDP has consulted gastroenterology -Patient reports does have issues with hemorrhoids -Follow CBC -We'll defer on transfusion until evaluated by nephrology in the event patient truly not anemic but actually volume overloaded secondary to progressive kidney disease and actually needs better diuresis -Despite being Jehovah witness patient does agree to proceeding with blood transfusions if indicated  Active Problems:   Unilateral visual loss -CT negative -Vision loss persists so we'll check MRI/MRA brain -No other focal neurological symptoms suggestive of stroke -Likely will need outpatient ophthalmologic evaluation; hopefully this is just reflective of patient's poor glycemic control and/or of evolving diabetic retinopathy    CKD (chronic kidney disease) stage 5, GFR less than 15 ml/min -Increasing BUN and creatinine with decreasing sodium and subtle increases and LFTs concerning for slightly more volume overload and resultant hepatic congestion; has known systolic dysfunction as well -Patient currently on a relatively low dose of Lasix at home 80 mg twice a day; given extremely low GFR patient may benefit from higher dosages of Lasix -We'll consult nephrology for input    Chronic combined systolic and diastolic CHF (congestive heart failure) -Seems compensated in regards to lack of symptoms -See above regarding chronic kidney disease and possible diuretic adjustment   Diabetes mellitus 2 on insulin, poorly controlled -Just admitted last week for [facility] K -Patient reports CBGs better but still greater than 300 noting CBG was 468 at presentation -Continue sliding scale insulin -Continue usual Lantus -Patient denies difficulty accessing diabetic medications    Benign essential  HTN -Poorly controlled -Continue home medications -May also be reflective of need for better diuresis    Seizure disorder -Continue medications    Hep C w/o coma, chronic -Mild transaminitis     Bipolar disorder -Continue home medications   DVT Prophylaxis: Subcutaneous heparin  Family Communication:   No family at bedside  Code Status:  Full Code  Condition:  Stable  Time spent in minutes : 60   ELLIS,ALLISON L. ANP on 11/22/2014 at 11:39 AM  Between 7am to 7pm - Pager - [phone]  After 7pm go to [email or web address] - password [email or web address]  And look for the night coverage person covering me after hours  [facility]  Patient Seen and Examined. Agree with Ms Allison's assessment and plan.   A 49 year old lady with h/o DM, bipolar, ESRD not on HD( pt refusing to be on HD), insulin dependent DM hyperlipidemia, hypertension comes  in for fluid overload, right sided visual loss, and generalized weakness. Her hemoglobin appears at baseline around 7.6, but with her h/o heme positive stools, GI consulted by EDP.  She does not appear to uremic and she continues to refuse any intervention from nephrology. Would resume her lasix for now and monitor her urine output. Have to keep in mind she is non compliant to her medications.   As for her visual loss, spoke to Dr Anderson Malta opthalmology, who requested to call her back when MRI brain results are back.   Resume the rest of the medications.   [name] Bosak,MD [phone].

## 2014-11-22 NOTE — ED Notes (Signed)
Admitting MD  At the bedside.

## 2014-11-22 NOTE — Consult Note (Signed)
Stroke Consult    Chief Complaint: blurred vision  HPI: [name] is an 49 y.o. female hx of glaucoma, bipolar disorder, DM, HTN presenting with acute onset of blurred vision in her right eye and gait instability. She reports going to bed at 2200 in her normal state of health. Woke at 0100 and noted some blurred vision in her right eye. Felt that the vision was getting smaller. Went back to bed, woke in the morning and noted the vision was even worse. Also noted some difficulty walking at that time due to bilateral leg heaviness. She notes this is a chronic issue due to anemia.   Upon EMS arrival noted to have SBP > 200 and CBG of 468  Date last known well: 11/21/2014 Time last known well: 2200 tPA Given: no, outside IV tPA window. Initial NIHSS of 0  Past Medical History  Diagnosis Date  . Neuropathy   . Glaucoma   . Bipolar 1 disorder   . Refusal of blood transfusions as patient is Jehovah's Witness   . Shortness of breath   . TIA (transient ischemic attack) 2010  . Neuromuscular disorder     DIABETIC NEUROPATHY  . MRSA infection ?2014    "on the back of my head; spread throughout my bloodstream"  . Bipolar disorder, unspecified 02/06/2014  . High cholesterol   . Type II diabetes mellitus     INSULIN DEPENDENT  . Hepatitis C   . Chronic pain syndrome   . Chronic kidney disease (CKD), stage III (moderate)   . Schizoaffective disorder, unspecified condition 02/08/2014  . Seizure dx'd 2014    "don't know what kind; last one was ~ 04/2014"  . Chronic combined systolic and diastolic CHF (congestive heart failure)     EF 40% by echo and 48% by stress test  . Anemia   . Hypertension   . Stroke     hx TIA  . Neuromuscular disorder     diabetic neuropathy  . Shortness of breath dyspnea   . Schizophrenia   . Seizures   . Hepatitis     hep C  . Bipolar disorder   . Glaucoma   . Chronic pain   . Diabetes mellitus without complication     on Insulin  . Depression   . Anxiety    . Chronic kidney disease     chronic kidney stage 3 Dr Mercy Moore  . GERD (gastroesophageal reflux disease)   . History of hiatal hernia   . Headache     sinus headaches    Past Surgical History  Procedure Laterality Date  . Amputation Left 05/21/2013    Procedure: Left Midfoot AMPUTATION;  Surgeon: Newt Minion, MD;  Location: [facility];  Service: Orthopedics;  Laterality: Left;  Left Midfoot Amputation  . Tubal ligation    . Colonoscopy with propofol N/A 06/22/2013    Procedure: COLONOSCOPY WITH PROPOFOL;  Surgeon: Jeryl Columbia, MD;  Location: [facility];  Service: Endoscopy;  Laterality: N/A;  . Abdominal hysterectomy  2014  . Tonsillectomy  1970's  . Refractive surgery Bilateral 2013  . Tubal ligation  2000  . Abdominal hysterectomy  2010  . Foot amputation Left     due diabetic neuropathy, could not feel ulcer on bottom of foot  . Eye surgery Bilateral 2010    Lasik    Family History  Problem Relation Age of Onset  . Hypertension Mother   . Hypertension Father    Social History:  reports that she quit smoking about 2 weeks ago. Her smoking use included Cigarettes. She has a 20 pack-year smoking history. She has never used smokeless tobacco. She reports that she drinks alcohol. She reports that she uses illicit drugs ("Crack" cocaine and Cocaine).  Allergies:  Allergies  Allergen Reactions  . Gabapentin Itching and Swelling  . Gabapentin Swelling  . Lyrica [Pregabalin] Swelling    Facial and leg swelling  . Penicillins Hives    Pt tolerates cefepime and ceftriaxone  . Saphris [Asenapine] Swelling    Swelling of the face  . Saphris [Asenapine] Swelling    Facial swelling  . Tramadol Swelling    Causes legs to swell  . Tramadol Swelling    Leg swelling  . Penicillins Hives    Has tolerated Cefepime and Ceftriaxone.     (Not in a hospital admission)  ROS: Out of a complete 14 system review, the patient complains of only the following symptoms, and all other  reviewed systems are negative. +blurred vision  CT head imaging reviewed, shows no acute infarct   Physical Examination: There were no vitals filed for this visit. Physical Exam  Constitutional: He appears well-developed and well-nourished.  Psych: Affect appropriate to situation Eyes: No scleral injection HENT: No OP obstrucion Head: Normocephalic.  Cardiovascular: Normal rate and regular rhythm.  Respiratory: Effort normal and breath sounds normal.  GI: Soft. Bowel sounds are normal. No distension. There is no tenderness.  Skin: WDI   Neurologic Examination: Mental Status: Alert, oriented, thought content appropriate.  Speech fluent without evidence of aphasia.  No dysarthria. Able to follow 3 step commands without difficulty. Cranial Nerves: II: unable to visualize optic discs bilaterally, PERRL bilaterally, Impaired peripheral vision nasal/temporal fields OD, intact OS. VA centrally 20/20 in OS and OD III,IV, VI: ptosis not present, extra-ocular motions intact bilaterally V,VII: smile symmetric, facial light touch sensation normal bilaterally VIII: hearing normal bilaterally IX,X: gag reflex present XI: trapezius strength/neck flexion strength normal bilaterally XII: tongue strength normal  Motor: Right : Upper extremity    Left:     Upper extremity 5/5 deltoid       5/5 deltoid 5/5 biceps      5/5 biceps  5/5 triceps      5/5 triceps 5/5 hand grip      5/5 hand grip  Lower extremity     Lower extremity 5/5 hip flexor      5/5 hip flexor 5/5 quadricep      5/5 quadriceps  5/5 hamstrings     5/5 hamstrings 5/5 plantar flexion       5/5 plantar flexion 5/5 plantar extension     5/5 plantar extension Tone and bulk:normal tone throughout; no atrophy noted Sensory: Pinprick and light touch intact throughout, bilaterally Deep Tendon Reflexes: 2+ and symmetric throughout Plantars: Right: downgoing   Left: downgoing Cerebellar: normal finger-to-nose, and normal  heel-to-shin test Gait: deferred  Laboratory Studies:   Basic Metabolic Panel: No results for input(s): NA, K, CL, CO2, GLUCOSE, BUN, CREATININE, CALCIUM, MG, PHOS in the last 168 hours.  Liver Function Tests: No results for input(s): AST, ALT, ALKPHOS, BILITOT, PROT, ALBUMIN in the last 168 hours. No results for input(s): LIPASE, AMYLASE in the last 168 hours. No results for input(s): AMMONIA in the last 168 hours.  CBC:  Recent Labs Lab 11/18/14 0854  HGB 8.6*    Cardiac Enzymes: No results for input(s): CKTOTAL, CKMB, CKMBINDEX, TROPONINI in the last 168 hours.  BNP: Invalid input(s):  POCBNP  CBG: No results for input(s): GLUCAP in the last 168 hours.  Microbiology: Results for orders placed or performed during the hospital encounter of 08/24/14  MRSA PCR Screening     Status: None   Collection Time: 08/25/14  2:26 AM  Result Value Ref Range Status   MRSA by PCR NEGATIVE NEGATIVE Final    Comment:        The GeneXpert MRSA Assay (FDA approved for NASAL specimens only), is one component of a comprehensive MRSA colonization surveillance program. It is not intended to diagnose MRSA infection nor to guide or monitor treatment for MRSA infections.     Coagulation Studies: No results for input(s): LABPROT, INR in the last 72 hours.  Urinalysis: No results for input(s): COLORURINE, LABSPEC, PHURINE, GLUCOSEU, HGBUR, BILIRUBINUR, KETONESUR, PROTEINUR, UROBILINOGEN, NITRITE, LEUKOCYTESUR in the last 168 hours.  Invalid input(s): APPERANCEUR  Lipid Panel:     Component Value Date/Time   CHOL 117 11/09/2014 0248   TRIG 82 11/09/2014 0248   HDL 56 11/09/2014 0248   CHOLHDL 2.1 11/09/2014 0248   VLDL 16 11/09/2014 0248   LDLCALC 45 11/09/2014 0248    HgbA1C:  Lab Results  Component Value Date   HGBA1C 9.8* 08/25/2014    Urine Drug Screen:     Component Value Date/Time   LABOPIA NONE DETECTED 11/09/2014 0457   COCAINSCRNUR POSITIVE* 11/09/2014 0457    LABBENZ NONE DETECTED 11/09/2014 0457   AMPHETMU NONE DETECTED 11/09/2014 0457   THCU NONE DETECTED 11/09/2014 0457   LABBARB NONE DETECTED 11/09/2014 0457    Alcohol Level: No results for input(s): ETH in the last 168 hours.  Other results:  Imaging: No results found.  Assessment: 49 y.o. female hx of bipolar disorder, DM, HTN, presenting with acute onset of blurred vision in right eye and subjective difficulty walking. NIHSS of 0 in ED. Exam pertinent for decreased peripheral vision in right eye. VA 20/20 bilaterally. History and presentation not consistent with CVA/TIA. CT head shows no acute infarct.   -no further stroke workup indicated at this time -continue on ASA 325mg  daily -risk factor management per primary care doctor -consider ophthalmology evaluation for unilateral blurred vision   Jim Like, DO [facility] [phone]  If 7pm- 7am, please page neurology on call as listed in [place]. 11/22/2014, 7:55 AM

## 2014-11-22 NOTE — ED Notes (Signed)
Pt. Also reports that she would like to have her Rt, elbow repaired.  She reports that she has been here 8 times to have her Rt. Elbow surgically repaired and it has not happened.

## 2014-11-22 NOTE — Progress Notes (Signed)
Code stroke called at 0723, arrived at [facility] ED via [place] [facility] [place].  AS per patient both legs have been feeling heavy, started on 11/21/14.  Woke at 0100 with trouble with her vision, unable to see and this am felt it was getting worse.  LSN 11/21/14, 2200.  NIHSS 0. Stroke cancelled at [phone]

## 2014-11-22 NOTE — ED Notes (Signed)
Came into the Room and pt,. Was eating a chicken salad sandwich.  Explained to pt. That she should not eat any thing.

## 2014-11-22 NOTE — ED Notes (Signed)
She also reports that she is having chest pain.

## 2014-11-23 DIAGNOSIS — D631 Anemia in chronic kidney disease: Secondary | ICD-10-CM | POA: Diagnosis not present

## 2014-11-23 DIAGNOSIS — D649 Anemia, unspecified: Secondary | ICD-10-CM | POA: Diagnosis not present

## 2014-11-23 LAB — GLUCOSE, CAPILLARY
GLUCOSE-CAPILLARY: 308 mg/dL — AB (ref 70–99)
GLUCOSE-CAPILLARY: 315 mg/dL — AB (ref 70–99)
Glucose-Capillary: 289 mg/dL — ABNORMAL HIGH (ref 70–99)
Glucose-Capillary: 313 mg/dL — ABNORMAL HIGH (ref 70–99)

## 2014-11-23 LAB — PREPARE RBC (CROSSMATCH)

## 2014-11-23 LAB — CBC
HCT: 23.5 % — ABNORMAL LOW (ref 36.0–46.0)
Hemoglobin: 7.8 g/dL — ABNORMAL LOW (ref 12.0–15.0)
MCH: 28.5 pg (ref 26.0–34.0)
MCHC: 33.2 g/dL (ref 30.0–36.0)
MCV: 85.8 fL (ref 78.0–100.0)
Platelets: 154 10*3/uL (ref 150–400)
RBC: 2.74 MIL/uL — ABNORMAL LOW (ref 3.87–5.11)
RDW: 14.7 % (ref 11.5–15.5)
WBC: 3.3 10*3/uL — ABNORMAL LOW (ref 4.0–10.5)

## 2014-11-23 LAB — BASIC METABOLIC PANEL
ANION GAP: 13 (ref 5–15)
BUN: 94 mg/dL — ABNORMAL HIGH (ref 6–23)
CHLORIDE: 96 mmol/L (ref 96–112)
CO2: 21 mmol/L (ref 19–32)
Calcium: 8.1 mg/dL — ABNORMAL LOW (ref 8.4–10.5)
Creatinine, Ser: 5.87 mg/dL — ABNORMAL HIGH (ref 0.50–1.10)
GFR calc Af Amer: 9 mL/min — ABNORMAL LOW (ref >90)
GFR calc non Af Amer: 8 mL/min — ABNORMAL LOW (ref >90)
Glucose, Bld: 294 mg/dL — ABNORMAL HIGH (ref 70–99)
Potassium: 4.1 mmol/L (ref 3.5–5.1)
SODIUM: 130 mmol/L — AB (ref 135–145)

## 2014-11-23 MED ORDER — PANTOPRAZOLE SODIUM 40 MG PO TBEC
40.0000 mg | DELAYED_RELEASE_TABLET | Freq: Two times a day (BID) | ORAL | Status: DC
  Administered 2014-11-23 – 2014-11-24 (×3): 40 mg via ORAL
  Filled 2014-11-23 (×3): qty 1

## 2014-11-23 MED ORDER — SODIUM CHLORIDE 0.9 % IV SOLN
Freq: Once | INTRAVENOUS | Status: AC
  Administered 2014-11-23: 18:00:00 via INTRAVENOUS

## 2014-11-23 MED ORDER — INSULIN ASPART 100 UNIT/ML ~~LOC~~ SOLN
4.0000 [IU] | Freq: Three times a day (TID) | SUBCUTANEOUS | Status: DC
  Administered 2014-11-23 – 2014-11-24 (×2): 4 [IU] via SUBCUTANEOUS

## 2014-11-23 MED ORDER — HYPROMELLOSE (GONIOSCOPIC) 2.5 % OP SOLN
1.0000 [drp] | Freq: Four times a day (QID) | OPHTHALMIC | Status: DC | PRN
  Filled 2014-11-23: qty 15

## 2014-11-23 MED ORDER — DIPHENHYDRAMINE HCL 25 MG PO CAPS
25.0000 mg | ORAL_CAPSULE | Freq: Four times a day (QID) | ORAL | Status: DC | PRN
  Administered 2014-11-23: 25 mg via ORAL
  Filled 2014-11-23: qty 1

## 2014-11-23 MED ORDER — INSULIN GLARGINE 100 UNIT/ML ~~LOC~~ SOLN
45.0000 [IU] | Freq: Every day | SUBCUTANEOUS | Status: DC
  Administered 2014-11-23: 45 [IU] via SUBCUTANEOUS
  Filled 2014-11-23 (×2): qty 0.45

## 2014-11-23 MED ORDER — DOCUSATE SODIUM 100 MG PO CAPS
100.0000 mg | ORAL_CAPSULE | Freq: Two times a day (BID) | ORAL | Status: DC
  Administered 2014-11-23 – 2014-11-24 (×3): 100 mg via ORAL
  Filled 2014-11-23 (×5): qty 1

## 2014-11-23 NOTE — Consult Note (Signed)
   [facility] CM Inpatient Consult   11/23/2014  [name] Dec 30, 1965 [id number] Patient was previously being contacted by [facility] but was unable to maintain contact. Patient evaluated for community based chronic disease management services with [facility] as a benefit of patient's [employer]. Spoke with patient at bedside to explain [facility] services.  Patient declined services stating she has no needs and did not wish to have these services. Left contact information and [facility] literature with the patient.  Will sign off.  For additional questions or referrals please contact:  Natividad Brood, RN BSN [facility] Liaison  [phone] business mobile phone

## 2014-11-23 NOTE — Care Management Note (Addendum)
    Page 1 of 1   11/24/2014     11:20:53 AM CARE MANAGEMENT NOTE 11/24/2014  Patient:  [name], [name]   Account Number:  [id number]  Date Initiated:  11/23/2014  Documentation initiated by:  Carles Collet  Subjective/Objective Assessment:   admitted with anemia and stroke symptoms. extensive medical history with frequent visits. has home health aid. lives home alone     Action/Plan:   given [facility] information, per patient request, will follow for additional needs.   Anticipated DC Date:  11/25/2014   Anticipated DC Plan:  [place]  CM consult  Follow-up appt scheduled      Choice offered to / List presented to:  C-1 Patient        [place] arranged  [place] - 11 Patient Refused      Status of service:  Completed, signed off Medicare Important Message given?   (If response is "NO", the following Medicare IM given date fields will be blank) Date Medicare IM given:   Medicare IM given by:   Date Additional Medicare IM given:   Additional Medicare IM given by:    Discharge Disposition:  HOME/SELF CARE  Per UR Regulation:  Reviewed for med. necessity/level of care/duration of stay  If discussed at [place] of Stay Meetings, dates discussed:    Comments:  11-24-14 appointment made for [facility] Next Tuesday (per pt preference) at 09:30am. Pt refused [facility]. Pt to use pharmacy at [facility], aware they will not fill pain medication, pt aggreeable and very appreciative for follow up there. Carles Collet RN BSN CM  11-23-14 Given [facility] information will follow for additional needs for discharge. Carles Collet RN BSN CM

## 2014-11-23 NOTE — Evaluation (Addendum)
Physical Therapy Evaluation Patient Details Name: [name] MRN: [id number] DOB: [birth year] Today's Date: 11/23/2014   History of Present Illness  49 y.o. female with past medical history of hypertension, GERD, depression, anxiety, seizure, congestive heart failu11 y.o. female with past medical history of hypertension, GERD, depression, anxiety, seizure, congestive heart failure, history of stroke, bipolar, schizophrenia, diabetes mellitus, hepatitis C, CDK-IV, medication noncompliance, who presents with symptomatic anemia.  Clinical Impression  Patient demonstrates deficits in functional mobility as indicated below. Will need continued skilled PT to address deficits and maximize function. Will see as indicated and progress as tolerated.    Follow Up Recommendations Home health PT    Equipment Recommendations     Recommendations for Other Services OT consult RE: arm fx    Precautions / Restrictions Precautions Precautions: Fall Precaution Comments: history of falls secondary to prior amputation Restrictions Weight Bearing Restrictions: No      Mobility  Bed Mobility Overal bed mobility: Modified Independent                Transfers Overall transfer level: Needs assistance Equipment used: None Transfers: Sit to/from Stand Sit to Stand: Min guard         General transfer comment: min/guard due to h/o falls and mild dizziness  Ambulation/Gait Ambulation/Gait assistance: Supervision Ambulation Distance (Feet): 20 Feet Assistive device: None Gait Pattern/deviations: Decreased weight shift to left;Narrow base of support Gait velocity: decreased   General Gait Details: patient with modest instability noted, alterred gait secondary to hx of amputation. No physical assist required  Stairs            Wheelchair Mobility    Modified Rankin (Stroke Patients Only)       Balance Overall balance assessment: Needs assistance   Sitting balance-Leahy  Scale: Good     Standing balance support: No upper extremity supported Standing balance-Leahy Scale: Fair                               Pertinent Vitals/Pain Pain Assessment: No/denies pain    Home Living Family/patient expects to be discharged to:: Private residence Living Arrangements: Children Available Help at Discharge: Available PRN/intermittently Type of Home: House Home Access: Stairs to enter Entrance Stairs-Rails: Can reach both Entrance Stairs-Number of Steps: 5 Home Layout: One level Home Equipment: Walker - standard;Cane - single point Additional Comments: son lives with her, he works nights    Prior Function Level of Independence: Independent               Hand Dominance   Dominant Hand: Left    Extremity/Trunk Assessment   Upper Extremity Assessment: Defer to OT evaluation           Lower Extremity Assessment: Generalized weakness   LLE Deficits / Details: L mid-foot amputation, knee ext -4/5  Cervical / Trunk Assessment: Normal  Communication   Communication: No difficulties  Cognition Arousal/Alertness: Awake/alert (awake but tired) Behavior During Therapy: WFL for tasks assessed/performed Overall Cognitive Status: Within Functional Limits for tasks assessed                      General Comments      Exercises        Assessment/Plan    PT Assessment Patient needs continued PT services  PT Diagnosis Difficulty walking;Generalized weakness   PT Problem List Decreased strength;Decreased activity tolerance;Decreased balance;Decreased mobility;Decreased safety awareness  PT Treatment Interventions Gait training;DME  instruction;Functional mobility training;Patient/family education;Balance training;Therapeutic exercise   PT Goals (Current goals can be found in the Care Plan section) Acute Rehab PT Goals Patient Stated Goal: to get her R arm fixed and feel better PT Goal Formulation: With patient Time For Goal  Achievement: 11/30/14 Potential to Achieve Goals: Fair    Frequency Min 3X/week   Barriers to discharge        Co-evaluation               End of Session Equipment Utilized During Treatment: Gait belt Activity Tolerance: Patient limited by fatigue Patient left: in bed;with call Bottcher/phone within reach;with nursing/sitter in room Nurse Communication: Mobility status         Time: [id number] PT Time Calculation (min) (ACUTE ONLY): 11 min   Charges:   PT Evaluation $Initial PT Evaluation Tier I: 1 Procedure     PT G CodesDuncan Dull 11-24-2014, 4:15 PM Alben Deeds, [place] DPT  [phone]

## 2014-11-23 NOTE — Progress Notes (Signed)
Patient Demographics  [name], is a 49 y.o. female, DOB - January 07, 1966, [id number]  Admit date - 11/22/2014   Admitting Physician Hosie Poisson, MD  Outpatient Primary MD for the patient is WEBB, Valla Leaver, MD  LOS -    Chief Complaint  Patient presents with  . Code Stroke        Subjective:   [name] today has, No headache, No chest pain, No abdominal pain - No Nausea, No new weakness tingling or numbness, No Cough - SOB. Her right eye vision is back to normal. She does feel weak overall.  Assessment & Plan    1. Symptomatic anemia. Combination of iron deficiency and anemia of chronic disease. She did use NSAIDs liberally, weakly Hemoccult positive, anemia panel is stable, will be placed on oral PPI, NSAID discontinued, she is agreeable to taking 1 unit of packed RBC which will be provided. Outpatient GI follow-up.   2. 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 Baseline creatinine of close to 3.5. She is much worse due to #1 above, transfuse, stop NSAIDs, repeat BMP, renal ultrasound without any acute changes. Renal on board.   3. Blurry vision upon admission. Seen by neurology. Stroke workup unremarkable which included CT head, MRI/MRA brain. Was also seen by ophthalmology just who cleared her. Vision is back to normal. Continue on statin and aspirin. Outpatient ophthalmology follow-up. Question due to hyperglycemia and poor glycemic control.   4. DM type II. On Lantus and sliding scale will continue to monitor. Insulin adjusted for better control.  Lab Results  Component Value Date   HGBA1C 9.8* 08/25/2014    CBG (last 3)   Recent Labs  11/22/14 2120 11/23/14 0830 11/23/14 1226  GLUCAP 282* 289* 313*    5. Essential hypertension. Blood pressure in good control,  continue combination of clonidine, Norvasc and diuretic.   6. Dyslipidemia on statin.    Code Status: Full  Family Communication: none  Disposition Plan: TBD   Procedures   CT head, MRI/MRA brain, renal ultrasound   Consults  Neuro, renal   Medications  Scheduled Meds: . sodium chloride   Intravenous Once  . amLODipine  5 mg Oral Daily  . atorvastatin  40 mg Oral q1800  . carvedilol  25 mg Oral BID  . cloNIDine  0.2 mg Oral QHS  . docusate sodium  100 mg Oral BID  . ferrous sulfate  325 mg Oral Q breakfast  . furosemide  80 mg Oral BID  . heparin  5,000 Units Subcutaneous 3 times per day  . insulin aspart  0-5 Units Subcutaneous QHS  . insulin aspart  0-9 Units Subcutaneous TID WC  . insulin glargine  36 Units Subcutaneous QHS  . multivitamin with minerals  1 tablet Oral Daily   Continuous Infusions:  PRN Meds:.acetaminophen, hydrALAZINE, metoCLOPramide, oxyCODONE-acetaminophen  DVT Prophylaxis  Heparin    Lab Results  Component Value Date   PLT 154 11/23/2014    Antibiotics    Anti-infectives    None          Objective:   Filed Vitals:   11/22/14 2143 11/23/14 0532 11/23/14 0620 11/23/14 1106  BP: 146/85 162/89  147/85  Pulse:  77    Temp:  98.2 F (36.8 C) 98.2 F (36.8 C)    TempSrc: Oral Oral    Resp: 16 20    Height:      Weight:   74.571 kg (164 lb 6.4 oz)   SpO2: 100% 100%      Wt Readings from Last 3 Encounters:  11/23/14 74.571 kg (164 lb 6.4 oz)  11/11/14 85.4 kg (188 lb 4.4 oz)  09/30/14 85.276 kg (188 lb)     Intake/Output Summary (Last 24 hours) at 11/23/14 1424 Last data filed at 11/23/14 0932  Gross per 24 hour  Intake    560 ml  Output      0 ml  Net    560 ml     Physical Exam  Awake Alert, Oriented X 3, No new F.N deficits, Normal affect .AT,PERRAL Supple Neck,No JVD, No cervical lymphadenopathy appriciated.  Symmetrical Chest wall movement, Good air movement bilaterally, CTAB RRR,No Gallops,Rubs or  new Murmurs, No Parasternal Heave +ve B.Sounds, Abd Soft, No tenderness, No organomegaly appriciated, No rebound - guarding or rigidity. No Cyanosis, Clubbing or edema, No new Rash or bruise      Data Review   Micro Results Recent Results (from the past 240 hour(s))  MRSA PCR Screening     Status: None   Collection Time: 11/22/14 12:50 PM  Result Value Ref Range Status   MRSA by PCR NEGATIVE NEGATIVE Final    Comment:        The GeneXpert MRSA Assay (FDA approved for NASAL specimens only), is one component of a comprehensive MRSA colonization surveillance program. It is not intended to diagnose MRSA infection nor to guide or monitor treatment for MRSA infections.     Radiology Reports Dg Chest 2 View  11/22/2014   CLINICAL DATA:  Fatigue off and on for 1 year worsening, headache for 2 days, visual changes RIGHT side, history kidney disease, anemia, CHF, diabetes, hypertension, stroke, hepatitis-C  EXAM: CHEST  2 VIEW  COMPARISON:  09/30/2014  FINDINGS: Upper normal heart size.  Mediastinal contours and pulmonary vascularity normal.  Lungs clear.  No pleural effusion or pneumothorax.  Bones unremarkable.  IMPRESSION: No acute abnormalities.   Electronically Signed   By: Lavonia Dana M.D.   On: 11/22/2014 09:20   Ct Head Wo Contrast  11/22/2014   CLINICAL DATA:  Code stroke. Leg weakness and heaviness. Right-sided headache. Abnormal vision right eye.  EXAM: CT HEAD WITHOUT CONTRAST  TECHNIQUE: Contiguous axial images were obtained from the base of the skull through the vertex without intravenous contrast.  COMPARISON:  MRI 11/10/2014.  CT 11/10/2014  FINDINGS: Symmetric enlargement of the sylvian fissure bilaterally compatible with significant atrophy. This is unchanged. Negative for hydrocephalus. Benign cyst in the left basal ganglia stable. Mild chronic microvascular ischemic changes in the white matter. Chronic encephalomalacia left occipital lobe unchanged.  Negative for acute  infarct. Negative for hemorrhage or mass. No change from prior studies.  IMPRESSION: Atrophy and chronic microvascular ischemic changes. No acute abnormality and no change from the prior exams.  Critical Value/emergent results were called by telephone at the time of interpretation on 11/22/2014 at 7:52 am to Dr. Jim Like , who verbally acknowledged these results.   Electronically Signed   By: Franchot Gallo M.D.   On: 11/22/2014 07:52   Ct Head Wo Contrast  11/10/2014   CLINICAL DATA:  Right leg weakness  EXAM: CT HEAD WITHOUT CONTRAST  TECHNIQUE: Contiguous axial images were obtained from the base of the skull  through the vertex without intravenous contrast.  COMPARISON:  11/09/2014  FINDINGS: No skull fracture is noted. Paranasal sinuses and mastoid air cells are unremarkable.  No intracranial hemorrhage, mass effect or midline shift. Again noted old lacunar infarct in left basal ganglia.  Stable mild cerebral atrophy. Stable mild periventricular and patchy subcortical chronic white matter disease. No definite acute cortical infarction. No mass lesion is noted on this unenhanced scan.  IMPRESSION: No definite acute cortical infarction. Stable atrophy and chronic mild white matter disease. 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   Electronically Signed   By: Lahoma Crocker M.D.   On: 11/10/2014 10:20   Ct Head Wo Contrast  11/09/2014   CLINICAL DATA:  Dizziness for 4 days.  Nausea and vomiting.  EXAM: CT HEAD WITHOUT CONTRAST  TECHNIQUE: Contiguous axial images were obtained from the base of the skull through the vertex without intravenous contrast.  COMPARISON:  MRI brain 02/02/2014.  CT head 02/02/2014.  FINDINGS: Diffuse cerebral atrophy. Prominent atrophy along the sylvian fissure bilaterally. Mild ventricular dilatation consistent with central atrophy. Low-attenuation changes in the deep white matter consistent with small vessel ischemia. Old lacunar infarct in the left basal ganglia. No  mass effect or midline shift. No abnormal extra-axial fluid collections. Gray-white matter junctions are distinct. Basal cisterns are not effaced. No evidence of acute intracranial hemorrhage. No depressed skull fractures. Visualized paranasal sinuses and mastoid air cells are not opacified.  IMPRESSION: No acute intracranial abnormalities. Chronic atrophy and small vessel ischemic changes.   Electronically Signed   By: Lucienne Capers M.D.   On: 11/09/2014 01:40   Mr Jodene Nam Head Wo Contrast  11/22/2014   CLINICAL DATA:  Acute onset of blurred vision in the RIGHT eye with gait instability. History of bipolar disorder, diabetes, hypertension, and glaucoma. Initial encounter.  EXAM: MRI HEAD WITHOUT CONTRAST  MRA HEAD WITHOUT CONTRAST  TECHNIQUE: Multiplanar, multiecho pulse sequences of the brain and surrounding structures were obtained without intravenous contrast. Angiographic images of the head were obtained using MRA technique without contrast.  COMPARISON:  CT head earlier today. MR head 11/10/2014. Also prior MR head 02/02/2014. In  FINDINGS: MRI HEAD FINDINGS  Unchanged symmetric temporoparietal atrophy with marked prominence of the central sulcus and sylvian fissures. Mild to moderate small vessel disease is also stable. No vascular occlusion. No areas of chronic hemorrhage. No midline abnormality. In extracranial soft tissues unremarkable. BILATERAL cataract extraction without visible acute abnormality of the globe.  MRA HEAD FINDINGS  The internal carotid arteries are widely patent. The basilar artery is widely patent with vertebrals codominant. No intracranial stenosis or aneurysm.  IMPRESSION: Stable severe atrophy.  No acute intracranial findings.  Negative MRA intracranial.   Electronically Signed   By: Rolla Flatten M.D.   On: 11/22/2014 19:38   Mr Brain Wo Contrast  11/22/2014   CLINICAL DATA:  Acute onset of blurred vision in the RIGHT eye with gait instability. History of bipolar disorder,  diabetes, hypertension, and glaucoma. Initial encounter.  EXAM: MRI HEAD WITHOUT CONTRAST  MRA HEAD WITHOUT CONTRAST  TECHNIQUE: Multiplanar, multiecho pulse sequences of the brain and surrounding structures were obtained without intravenous contrast. Angiographic images of the head were obtained using MRA technique without contrast.  COMPARISON:  CT head earlier today. MR head 11/10/2014. Also prior MR head 02/02/2014. In  FINDINGS: MRI HEAD FINDINGS  Unchanged symmetric temporoparietal atrophy with marked prominence of the central sulcus and sylvian fissures. Mild to moderate small vessel disease is  also stable. No vascular occlusion. No areas of chronic hemorrhage. No midline abnormality. In extracranial soft tissues unremarkable. BILATERAL cataract extraction without visible acute abnormality of the globe.  MRA HEAD FINDINGS  The internal carotid arteries are widely patent. The basilar artery is widely patent with vertebrals codominant. No intracranial stenosis or aneurysm.  IMPRESSION: Stable severe atrophy.  No acute intracranial findings.  Negative MRA intracranial.   Electronically Signed   By: Rolla Flatten M.D.   On: 11/22/2014 19:38   Mr Brain Wo Contrast  11/10/2014   CLINICAL DATA:  Seizure disorder. Nausea and vomiting. Prior history of stroke. Initial encounter.  EXAM: MRI HEAD WITHOUT CONTRAST  TECHNIQUE: Multiplanar, multiecho pulse sequences of the brain and surrounding structures were obtained without intravenous contrast.  COMPARISON:  CT head 11/10/2014.  MR head 02/02/2014.  FINDINGS: No evidence for acute infarction, hemorrhage, mass lesion, hydrocephalus, or extra-axial fluid.  Fairly symmetric temporoparietal atrophy with marked prominence of the central sulcus and sylvian fissures. Mild to moderate small vessel disease is stable. Flow voids are maintained throughout the carotid, basilar, and vertebral arteries. There are no areas of chronic hemorrhage. Pituitary, pineal, and cerebellar  tonsils unremarkable. No upper cervical lesions. No asymmetric Hippocampal insult or volume loss.  IMPRESSION: Severe atrophy is stable.  No acute intracranial findings.   Electronically Signed   By: Rolla Flatten M.D.   On: 11/10/2014 13:16   US Renal  11/09/2014   CLINICAL DATA:  Initial evaluation for acute renal injury. History of chronic stage III renal disease, hypertension, diabetes, hep C.  EXAM: RENAL/URINARY TRACT ULTRASOUND COMPLETE  COMPARISON:  Prior study from 07/06/2014  FINDINGS: Right Kidney:  Length: 11.1 cm. Renal cortex diffusely echogenic in appearance, suggesting underlying medical renal disease. No mass or hydronephrosis visualized.  Left Kidney:  Length: 11.3 cm. Renal cortex diffusely echogenic in appearance, suggesting underlying medical renal disease. No mass or hydronephrosis visualized.  Bladder:  Appears normal for degree of bladder distention. Bilateral ureteral jets present.  IMPRESSION: Echogenic kidneys bilaterally, suggesting underlying medical renal disease. No hydronephrosis or other acute abnormality.   Electronically Signed   By: Jeannine Boga M.D.   On: 11/09/2014 03:08     CBC  Recent Labs Lab 11/18/14 0854 11/22/14 0734 11/23/14 0729  WBC  --  3.8* 3.3*  HGB 8.6* 7.6* 7.8*  HCT  --  22.7* 23.5*  PLT  --  172 154  MCV  --  85.7 85.8  MCH  --  28.7 28.5  MCHC  --  33.5 33.2  RDW  --  14.6 14.7  LYMPHSABS  --  1.6  --   MONOABS  --  0.3  --   EOSABS  --  0.1  --   BASOSABS  --  0.0  --     Chemistries   Recent Labs Lab 11/22/14 0734 11/23/14 0729  NA 127* 130*  K 4.1 4.1  CL 92* 96  CO2 22 21  GLUCOSE 394* 294*  BUN 93* 94*  CREATININE 6.03* 5.87*  CALCIUM 8.3* 8.1*  AST 67*  --   ALT 63*  --   ALKPHOS 124*  --   BILITOT 0.7  --    ------------------------------------------------------------------------------------------------------------------ estimated creatinine clearance is 11.6 mL/min (by C-G formula based on Cr of  5.87). ------------------------------------------------------------------------------------------------------------------ No results for input(s): HGBA1C in the last 72 hours. ------------------------------------------------------------------------------------------------------------------ No results for input(s): CHOL, HDL, LDLCALC, TRIG, CHOLHDL, LDLDIRECT in the last 72 hours. ------------------------------------------------------------------------------------------------------------------ No results for input(s): TSH,  T4TOTAL, T3FREE, THYROIDAB in the last 72 hours.  Invalid input(s): FREET3 ------------------------------------------------------------------------------------------------------------------ No results for input(s): VITAMINB12, FOLATE, FERRITIN, TIBC, IRON, RETICCTPCT in the last 72 hours.  Coagulation profile  Recent Labs Lab 11/22/14 0734  INR 1.15    No results for input(s): DDIMER in the last 72 hours.  Cardiac Enzymes No results for input(s): CKMB, TROPONINI, MYOGLOBIN in the last 168 hours.  Invalid input(s): CK ------------------------------------------------------------------------------------------------------------------ Invalid input(s): POCBNP     Time Spent in minutes  35   Elya Tarquinio K M.D on 11/23/2014 at 2:24 PM  Between 7am to 7pm - Pager - [phone]  After 7pm go to [email or web address] - password [facility]  [facility]   Office  [phone]

## 2014-11-23 NOTE — Consult Note (Signed)
Reason for Consult:AKI/CKD Referring Physician: Candiss Norse, MD  [name] is an 49 y.o. female.  HPI: Pt is a55yo F with PMH sig for bipolar disorder, DM, HTN, CHF, Hepatitis C, and CKD stage 4 (followed by Dr. Mercy Moore), who was admitted on 11/22/14 with blurred vision in the right eye.  She initially awakened at 1 AM and noticed blurred vision in the right eye and felt that the visual field was narrowing. She went back to bed and when she awakened this morning her vision was worse. She reported difficulty walking related to bilateral leg heaviness but reports this is also a chronic issue related to her chronic anemia. She admits to taking Alleve for the last week or so due to right arm pain and did not know that she was to avoid NSAIDs/Cox-II I's.  She has met with Dr. Mercy Moore and is aware of her advanced CKD but is not willing to pursue RRT.    Trend in Creatinine: CREATININE, SER  Date/Time Value Ref Range Status  11/23/2014 07:29 AM 5.87* 0.50 - 1.10 mg/dL Final  11/22/2014 07:34 AM 6.03* 0.50 - 1.10 mg/dL Final  11/11/2014 04:04 AM 4.98* 0.50 - 1.10 mg/dL Final  11/10/2014 05:41 AM 5.10* 0.50 - 1.10 mg/dL Final  11/09/2014 08:40 AM 5.27* 0.50 - 1.10 mg/dL Final  11/09/2014 02:52 AM 5.41* 0.50 - 1.10 mg/dL Final  11/08/2014 10:14 PM 5.64* 0.50 - 1.10 mg/dL Final  10/31/2014 12:58 PM 4.80* 0.50 - 1.10 mg/dL Final  10/27/2014 09:46 PM 4.86* 0.50 - 1.10 mg/dL Final  09/30/2014 01:24 PM 4.73* 0.50 - 1.10 mg/dL Final  08/27/2014 12:20 AM 4.20* 0.50 - 1.10 mg/dL Final  08/26/2014 03:12 AM 4.19* 0.50 - 1.10 mg/dL Final  08/25/2014 08:25 AM 3.94* 0.50 - 1.10 mg/dL Final  08/24/2014 11:19 PM 4.09* 0.50 - 1.10 mg/dL Final  08/09/2014 12:53 PM 4.34* 0.50 - 1.10 mg/dL Final  07/22/2014 04:44 AM 5.00* 0.50 - 1.10 mg/dL Final  07/10/2014 05:00 AM 4.42* 0.50 - 1.10 mg/dL Final  07/09/2014 07:33 AM 4.30* 0.50 - 1.10 mg/dL Final  07/08/2014 04:40 AM 4.24* 0.50 - 1.10 mg/dL Final  07/07/2014 05:34 AM  4.07* 0.50 - 1.10 mg/dL Final  07/06/2014 06:56 AM 3.80* 0.50 - 1.10 mg/dL Final  07/05/2014 08:01 AM 3.47* 0.50 - 1.10 mg/dL Final  06/25/2014 04:25 AM 3.57* 0.50 - 1.10 mg/dL Final  06/24/2014 04:04 AM 3.47* 0.50 - 1.10 mg/dL Final  06/23/2014 08:16 AM 3.22* 0.50 - 1.10 mg/dL Final  06/22/2014 08:50 PM 3.11* 0.50 - 1.10 mg/dL Final  06/22/2014 03:22 PM 3.01* 0.50 - 1.10 mg/dL Final  04/27/2014 08:26 PM 2.61* 0.50 - 1.10 mg/dL Final  04/25/2014 05:00 AM 2.90* 0.50 - 1.10 mg/dL Final  04/23/2014 05:20 AM 2.43* 0.50 - 1.10 mg/dL Final  04/22/2014 07:31 AM 2.20* 0.50 - 1.10 mg/dL Final  04/14/2014 04:41 AM 2.95* 0.50 - 1.10 mg/dL Final  04/13/2014 04:24 AM 2.67* 0.50 - 1.10 mg/dL Final  04/12/2014 05:15 AM 2.43* 0.50 - 1.10 mg/dL Final  04/11/2014 11:44 PM 2.20* 0.50 - 1.10 mg/dL Final  04/11/2014 04:58 PM 2.37* 0.50 - 1.10 mg/dL Final  02/23/2014 02:24 AM 2.01* 0.50 - 1.10 mg/dL Final  02/21/2014 12:24 AM 2.20* 0.50 - 1.10 mg/dL Final  02/10/2014 04:35 AM 2.45* 0.50 - 1.10 mg/dL Final  02/09/2014 04:15 AM 2.38* 0.50 - 1.10 mg/dL Final  02/08/2014 03:07 PM 2.50* 0.50 - 1.10 mg/dL Final  02/08/2014 04:25 AM 2.63* 0.50 - 1.10 mg/dL Final  02/07/2014 04:59 AM  2.12* 0.50 - 1.10 mg/dL Final  02/06/2014 04:40 AM 1.73* 0.50 - 1.10 mg/dL Final  02/05/2014 04:26 AM 1.84* 0.50 - 1.10 mg/dL Final  02/04/2014 04:53 AM 1.95* 0.50 - 1.10 mg/dL Final  02/03/2014 04:00 AM 2.23* 0.50 - 1.10 mg/dL Final  02/02/2014 04:09 PM 2.11* 0.50 - 1.10 mg/dL Final  02/02/2014 10:50 AM 2.23* 0.50 - 1.10 mg/dL Final  02/02/2014 08:18 AM 2.21* 0.50 - 1.10 mg/dL Final  02/02/2014 06:30 AM 2.17* 0.50 - 1.10 mg/dL Final  02/02/2014 04:35 AM 2.08* 0.50 - 1.10 mg/dL Final    PMH:   Past Medical History  Diagnosis Date  . Neuropathy   . Glaucoma   . Bipolar 1 disorder   . Refusal of blood transfusions as patient is Jehovah's Witness   . Shortness of breath   . TIA (transient ischemic attack) 2010  .  Neuromuscular disorder     DIABETIC NEUROPATHY  . MRSA infection ?2014    "on the back of my head; spread throughout my bloodstream"  . Bipolar disorder, unspecified 02/06/2014  . High cholesterol   . Type II diabetes mellitus     INSULIN DEPENDENT  . Hepatitis C   . Chronic pain syndrome   . Chronic kidney disease (CKD), stage III (moderate)   . Schizoaffective disorder, unspecified condition 02/08/2014  . Seizure dx'd 2014    "don't know what kind; last one was ~ 04/2014"  . Chronic combined systolic and diastolic CHF (congestive heart failure)     EF 40% by echo and 48% by stress test  . Anemia   . Hypertension   . Stroke     hx TIA  . Neuromuscular disorder     diabetic neuropathy  . Shortness of breath dyspnea   . Schizophrenia   . Seizures   . Hepatitis     hep C  . Bipolar disorder   . Glaucoma   . Chronic pain   . Diabetes mellitus without complication     on Insulin  . Depression   . Anxiety   . Chronic kidney disease     chronic kidney stage 3 Dr Mercy Moore  . GERD (gastroesophageal reflux disease)   . History of hiatal hernia   . Headache     sinus headaches    PSH:   Past Surgical History  Procedure Laterality Date  . Amputation Left 05/21/2013    Procedure: Left Midfoot AMPUTATION;  Surgeon: Newt Minion, MD;  Location: [facility];  Service: Orthopedics;  Laterality: Left;  Left Midfoot Amputation  . Tubal ligation    . Colonoscopy with propofol N/A 06/22/2013    Procedure: COLONOSCOPY WITH PROPOFOL;  Surgeon: Jeryl Columbia, MD;  Location: [facility];  Service: Endoscopy;  Laterality: N/A;  . Abdominal hysterectomy  2014  . Tonsillectomy  1970's  . Refractive surgery Bilateral 2013  . Tubal ligation  2000  . Abdominal hysterectomy  2010  . Foot amputation Left     due diabetic neuropathy, could not feel ulcer on bottom of foot  . Eye surgery Bilateral 2010    Lasik    Allergies:  Allergies  Allergen Reactions  . Gabapentin Itching and Swelling  .  Gabapentin Swelling  . Lyrica [Pregabalin] Swelling    Facial and leg swelling  . Penicillins Hives    Pt tolerates cefepime and ceftriaxone  . Saphris [Asenapine] Swelling    Swelling of the face  . Saphris [Asenapine] Swelling    Facial swelling  . Tramadol  Swelling    Causes legs to swell  . Tramadol Swelling    Leg swelling  . Penicillins Hives    Has tolerated Cefepime and Ceftriaxone.    Medications:   Prior to Admission medications   Medication Sig Start Date End Date Taking? Authorizing Provider  acetaminophen (TYLENOL) 500 MG tablet Take 500 mg by mouth every 6 (six) hours as needed for mild pain or moderate pain.   Yes Historical Provider, MD  amLODipine (NORVASC) 5 MG tablet Take 5 mg by mouth daily.  09/15/14  Yes Historical Provider, MD  aspirin EC 325 MG EC tablet Take 1 tablet (325 mg total) by mouth daily. 08/27/14  Yes Geradine Girt, DO  atorvastatin (LIPITOR) 40 MG tablet Take 1 tablet (40 mg total) by mouth daily at 6 PM. 11/11/14  Yes Shanker Kristeen Mans, MD  carvedilol (COREG) 25 MG tablet Take 1 tablet (25 mg total) by mouth 2 (two) times daily. 06/24/14  Yes Debbe Odea, MD  cloNIDine (CATAPRES) 0.2 MG tablet Take 0.2 mg by mouth at bedtime.  08/28/14  Yes Historical Provider, MD  Fe Bisgly-Vit C-Vit B12-FA 28-60-0.008-0.4 MG CAPS Take 1 tablet by mouth daily.   Yes Historical Provider, MD  ferrous sulfate 325 (65 FE) MG tablet Take 1 tablet (325 mg total) by mouth daily with breakfast. 06/24/14  Yes Debbe Odea, MD  folic acid (FOLVITE) 811 MCG tablet Take 200 mcg by mouth every morning.    Yes Historical Provider, MD  furosemide (LASIX) 40 MG tablet Take 2 tablets (80 mg total) by mouth 2 (two) times daily. 08/09/14  Yes Larene Pickett, PA-C  insulin aspart (NOVOLOG) 100 UNIT/ML injection Inject 2-10 Units into the skin 3 (three) times daily before meals. SSI: 0-200 = 2 units, 201-250 = 4 units, 251-300 = 6 units, 301-350 = 8 units, 351-400 = 10 units   Yes Historical  Provider, MD  insulin glargine (LANTUS) 100 UNIT/ML injection Inject 0.36 mLs (36 Units total) into the skin daily. Patient taking differently: Inject 36 Units into the skin at bedtime.  11/11/14  Yes Shanker Kristeen Mans, MD  Lactobacillus (ACIDOPHILUS) 100 MG CAPS Take 100 mg by mouth daily.   Yes Historical Provider, MD  metoCLOPramide (REGLAN) 10 MG tablet Take 1 tablet (10 mg total) by mouth every 8 (eight) hours as needed for nausea. 11/11/14  Yes Shanker Kristeen Mans, MD  Multiple Vitamin (MULTIVITAMIN WITH MINERALS) TABS tablet Take 1 tablet by mouth daily.   Yes Historical Provider, MD  mupirocin cream (BACTROBAN) 2 % Apply topically daily. Patient taking differently: Apply 1 application topically daily as needed (itching, skin irritation).  08/27/14  Yes Geradine Girt, DO  naproxen sodium (ANAPROX) 220 MG tablet Take 220 mg by mouth daily as needed (pain).   Yes Historical Provider, MD  oxyCODONE-acetaminophen (PERCOCET/ROXICET) 5-325 MG per tablet Take 1 tablet by mouth every 6 (six) hours as needed for moderate pain. 11/11/14  Yes Shanker Kristeen Mans, MD  polyethylene glycol (MIRALAX / GLYCOLAX) packet Take 17 g by mouth 2 (two) times daily. Mix in 8 oz liquid and drink 08/24/14  Yes Historical Provider, MD    Inpatient medications: . amLODipine  5 mg Oral Daily  . atorvastatin  40 mg Oral q1800  . carvedilol  25 mg Oral BID  . cloNIDine  0.2 mg Oral QHS  . docusate sodium  100 mg Oral BID  . ferrous sulfate  325 mg Oral Q breakfast  . furosemide  80  mg Oral BID  . heparin  5,000 Units Subcutaneous 3 times per day  . insulin aspart  0-5 Units Subcutaneous QHS  . insulin aspart  0-9 Units Subcutaneous TID WC  . insulin glargine  36 Units Subcutaneous QHS  . multivitamin with minerals  1 tablet Oral Daily    Discontinued Meds:   Medications Discontinued During This Encounter  Medication Reason  . levETIRAcetam (KEPPRA) 500 MG tablet Not available  . zolpidem (AMBIEN) 5 MG tablet Not  available  . folic acid (FOLVITE) tablet 200 mcg Not available  . oxyCODONE-acetaminophen (PERCOCET/ROXICET) 5-325 MG per tablet 1 tablet     Social History:  reports that she quit smoking about 2 weeks ago. Her smoking use included Cigarettes. She has a 20 pack-year smoking history. She has never used smokeless tobacco. She reports that she drinks alcohol. She reports that she uses illicit drugs ("Crack" cocaine and Cocaine).  Family History:   Family History  Problem Relation Age of Onset  . Hypertension Mother   . Hypertension Father     Pertinent items are noted in HPI. Weight change:   Intake/Output Summary (Last 24 hours) at 11/23/14 0824 Last data filed at 11/22/14 2050  Gross per 24 hour  Intake    440 ml  Output      0 ml  Net    440 ml   BP 162/89 mmHg  Pulse 77  Temp(Src) 98.2 F (36.8 C) (Oral)  Resp 20  Ht $R'5\' 4"'de$  (1.626 m)  Wt 74.571 kg (164 lb 6.4 oz)  BMI 28.21 kg/m2  SpO2 100% Filed Vitals:   11/22/14 1615 11/22/14 2143 11/23/14 0532 11/23/14 0620  BP: 157/80 146/85 162/89   Pulse: 86  77   Temp:  98.2 F (36.8 C) 98.2 F (36.8 C)   TempSrc:  Oral Oral   Resp:  16 20   Height:      Weight:    74.571 kg (164 lb 6.4 oz)  SpO2: 100% 100% 100%      General appearance: no distress and slowed mentation Head: Normocephalic, without obvious abnormality, atraumatic Resp: clear to auscultation bilaterally Cardio: no rub GI: soft, non-tender; bowel sounds normal; no masses,  no organomegaly Extremities: minimal pretibial edema, s/p left transmet amputation  Labs: Basic Metabolic Panel:  Recent Labs Lab 11/22/14 0734 11/23/14 0729  NA 127* 130*  K 4.1 4.1  CL 92* 96  CO2 22 21  GLUCOSE 394* 294*  BUN 93* 94*  CREATININE 6.03* 5.87*  ALBUMIN 2.3*  --   CALCIUM 8.3* 8.1*   Liver Function Tests:  Recent Labs Lab 11/22/14 0734  AST 67*  ALT 63*  ALKPHOS 124*  BILITOT 0.7  PROT 5.9*  ALBUMIN 2.3*   No results for input(s): LIPASE,  AMYLASE in the last 168 hours. No results for input(s): AMMONIA in the last 168 hours. CBC:  Recent Labs Lab 11/18/14 0854 11/22/14 0734 11/23/14 0729  WBC  --  3.8* 3.3*  NEUTROABS  --  1.9  --   HGB 8.6* 7.6* 7.8*  HCT  --  22.7* 23.5*  MCV  --  85.7 85.8  PLT  --  172 154   PT/INR: $Remove'@[id number]'cBBTEVa$ (inr:5) Cardiac Enzymes: )No results for input(s): CKTOTAL, CKMB, CKMBINDEX, TROPONINI in the last 168 hours. CBG:  Recent Labs Lab 11/22/14 1214 11/22/14 1615 11/22/14 2120  GLUCAP 305* 342* 282*    Iron Studies: No results for input(s): IRON, TIBC, TRANSFERRIN, FERRITIN in the last 168 hours.  Xrays/Other Studies: Dg Chest 2 View  11/22/2014   CLINICAL DATA:  Fatigue off and on for 1 year worsening, headache for 2 days, visual changes RIGHT side, history kidney disease, anemia, CHF, diabetes, hypertension, stroke, hepatitis-C  EXAM: CHEST  2 VIEW  COMPARISON:  09/30/2014  FINDINGS: Upper normal heart size.  Mediastinal contours and pulmonary vascularity normal.  Lungs clear.  No pleural effusion or pneumothorax.  Bones unremarkable.  IMPRESSION: No acute abnormalities.   Electronically Signed   By: Lavonia Dana M.D.   On: 11/22/2014 09:20   Ct Head Wo Contrast  11/22/2014   CLINICAL DATA:  Code stroke. Leg weakness and heaviness. Right-sided headache. Abnormal vision right eye.  EXAM: CT HEAD WITHOUT CONTRAST  TECHNIQUE: Contiguous axial images were obtained from the base of the skull through the vertex without intravenous contrast.  COMPARISON:  MRI 11/10/2014.  CT 11/10/2014  FINDINGS: Symmetric enlargement of the sylvian fissure bilaterally compatible with significant atrophy. This is unchanged. Negative for hydrocephalus. Benign cyst in the left basal ganglia stable. Mild chronic microvascular ischemic changes in the white matter. Chronic encephalomalacia left occipital lobe unchanged.  Negative for acute infarct. Negative for hemorrhage or mass. No change from prior studies.   IMPRESSION: Atrophy and chronic microvascular ischemic changes. No acute abnormality and no change from the prior exams.  Critical Value/emergent results were called by telephone at the time of interpretation on 11/22/2014 at 7:52 am to Dr. Jim Like , who verbally acknowledged these results.   Electronically Signed   By: Franchot Gallo M.D.   On: 11/22/2014 07:52   Mr Jodene Nam Head Wo Contrast  11/22/2014   CLINICAL DATA:  Acute onset of blurred vision in the RIGHT eye with gait instability. History of bipolar disorder, diabetes, hypertension, and glaucoma. Initial encounter.  EXAM: MRI HEAD WITHOUT CONTRAST  MRA HEAD WITHOUT CONTRAST  TECHNIQUE: Multiplanar, multiecho pulse sequences of the brain and surrounding structures were obtained without intravenous contrast. Angiographic images of the head were obtained using MRA technique without contrast.  COMPARISON:  CT head earlier today. MR head 11/10/2014. Also prior MR head 02/02/2014. In  FINDINGS: MRI HEAD FINDINGS  Unchanged symmetric temporoparietal atrophy with marked prominence of the central sulcus and sylvian fissures. Mild to moderate small vessel disease is also stable. No vascular occlusion. No areas of chronic hemorrhage. No midline abnormality. In extracranial soft tissues unremarkable. BILATERAL cataract extraction without visible acute abnormality of the globe.  MRA HEAD FINDINGS  The internal carotid arteries are widely patent. The basilar artery is widely patent with vertebrals codominant. No intracranial stenosis or aneurysm.  IMPRESSION: Stable severe atrophy.  No acute intracranial findings.  Negative MRA intracranial.   Electronically Signed   By: Rolla Flatten M.D.   On: 11/22/2014 19:38   Mr Brain Wo Contrast  11/22/2014   CLINICAL DATA:  Acute onset of blurred vision in the RIGHT eye with gait instability. History of bipolar disorder, diabetes, hypertension, and glaucoma. Initial encounter.  EXAM: MRI HEAD WITHOUT CONTRAST  MRA HEAD  WITHOUT CONTRAST  TECHNIQUE: Multiplanar, multiecho pulse sequences of the brain and surrounding structures were obtained without intravenous contrast. Angiographic images of the head were obtained using MRA technique without contrast.  COMPARISON:  CT head earlier today. MR head 11/10/2014. Also prior MR head 02/02/2014. In  FINDINGS: MRI HEAD FINDINGS  Unchanged symmetric temporoparietal atrophy with marked prominence of the central sulcus and sylvian fissures. Mild to moderate small vessel disease is also stable. No vascular occlusion. No  areas of chronic hemorrhage. No midline abnormality. In extracranial soft tissues unremarkable. BILATERAL cataract extraction without visible acute abnormality of the globe.  MRA HEAD FINDINGS  The internal carotid arteries are widely patent. The basilar artery is widely patent with vertebrals codominant. No intracranial stenosis or aneurysm.  IMPRESSION: Stable severe atrophy.  No acute intracranial findings.  Negative MRA intracranial.   Electronically Signed   By: Rolla Flatten M.D.   On: 11/22/2014 19:38     Assessment/Plan: 1. AKI/CKD- pt has been followed by Dr. Mercy Moore as an outpt and has stated very plainly and repeatedly that she is not interested in any renal replacement therapy and would agree to hospice if her renal function were to continue to decline.  Her most recent episode of AKI/CKD may be related to her use of NSAIDs for her right arm pain as well as worsening anemia/ischemic ATN.  She is a Sales promotion account executive witness but stated that she would agree to a blood transfusion 1. Stop NSAIDs 2. Continue with supportive care and renal dose medications 2. Anemia of chronic disease- already on aranesp as an outpt 3. HTN- under better control 4. Bipolar/schizoaffective disorder- stable 5. Right arm fracture- repair was placed on hold due to her reluctance for blood transfusions, however she is now amenable and may benefit from intervention during this  hospitalization. 6. Vision loss/change- w/u per primary svc 7. Hep C- has some abnormal LFT's 8. DM- per primary svc   Veniamin Kincaid A 11/23/2014, 8:24 AM

## 2014-11-23 NOTE — Progress Notes (Signed)
UR completed 

## 2014-11-23 NOTE — Progress Notes (Signed)
Inpatient Diabetes Program Recommendations  AACE/ADA: New Consensus Statement on Inpatient Glycemic Control (2013)  Target Ranges:  Prepandial:   less than 140 mg/dL      Peak postprandial:   less than 180 mg/dL (1-2 hours)      Critically ill patients:  140 - 180 mg/dL   Results for [name], [name] (MRN [id number]) as of 11/23/2014 11:33  Ref. Range 11/22/2014 12:14 11/22/2014 16:15 11/22/2014 21:20 11/23/2014 08:30  Glucose-Capillary Latest Ref Range: 70-99 mg/dL 305 (H) 342 (H) 282 (H) 289 (H)    Diabetes history: DM2 Outpatient Diabetes medications: Lantus 36 units daily, Novolog 2-10 units TID with meals Current orders for Inpatient glycemic control: Lantus 36 units QHS, Novolog 0-9 units with meals, Novolog 0-5 units HS  Inpatient Diabetes Program Recommendations Insulin - Basal: Please consider increasing Lantus to 40 units QHS. Correction (SSI): Please consider increasing Novolog correction to moderate scale. Insulin - Meal Coverage: If patient is eating at least 50% of meals, please consider ordering Novolog 3 units TID with meals (in addition to Novolog correction).  Thanks, Barnie Alderman, RN, MSN, CCRN, CDE Diabetes Coordinator Inpatient Diabetes Program [phone] (Team Pager from [place] to [facility]) [phone] ([facility] office) [phone] [facility] office)

## 2014-11-24 ENCOUNTER — Encounter: Payer: Self-pay | Admitting: Physician Assistant

## 2014-11-24 DIAGNOSIS — D631 Anemia in chronic kidney disease: Secondary | ICD-10-CM | POA: Diagnosis not present

## 2014-11-24 DIAGNOSIS — D649 Anemia, unspecified: Secondary | ICD-10-CM | POA: Diagnosis not present

## 2014-11-24 LAB — CBC
HCT: 24.6 % — ABNORMAL LOW (ref 36.0–46.0)
HCT: 30.1 % — ABNORMAL LOW (ref 36.0–46.0)
HEMOGLOBIN: 10.1 g/dL — AB (ref 12.0–15.0)
HEMOGLOBIN: 8.2 g/dL — AB (ref 12.0–15.0)
MCH: 29.4 pg (ref 26.0–34.0)
MCH: 29 pg (ref 26.0–34.0)
MCHC: 33.6 g/dL (ref 30.0–36.0)
MCHC: 33.3 g/dL (ref 30.0–36.0)
MCV: 87.5 fL (ref 78.0–100.0)
MCV: 86.9 fL (ref 78.0–100.0)
PLATELETS: 193 10*3/uL (ref 150–400)
Platelets: 155 10*3/uL (ref 150–400)
RBC: 2.83 MIL/uL — ABNORMAL LOW (ref 3.87–5.11)
RBC: 3.44 MIL/uL — AB (ref 3.87–5.11)
RDW: 14.3 % (ref 11.5–15.5)
RDW: 14.6 % (ref 11.5–15.5)
WBC: 3.6 10*3/uL — ABNORMAL LOW (ref 4.0–10.5)
WBC: 4.4 10*3/uL (ref 4.0–10.5)

## 2014-11-24 LAB — COMPREHENSIVE METABOLIC PANEL
ALT: 55 U/L — ABNORMAL HIGH (ref 0–35)
ANION GAP: 12 (ref 5–15)
AST: 51 U/L — ABNORMAL HIGH (ref 0–37)
Albumin: 2.5 g/dL — ABNORMAL LOW (ref 3.5–5.2)
Alkaline Phosphatase: 81 U/L (ref 39–117)
BILIRUBIN TOTAL: 0.6 mg/dL (ref 0.3–1.2)
BUN: 92 mg/dL — AB (ref 6–23)
CALCIUM: 8.7 mg/dL (ref 8.4–10.5)
CHLORIDE: 98 mmol/L (ref 96–112)
CO2: 24 mmol/L (ref 19–32)
CREATININE: 6.1 mg/dL — AB (ref 0.50–1.10)
GFR calc Af Amer: 9 mL/min — ABNORMAL LOW (ref >90)
GFR, EST NON AFRICAN AMERICAN: 7 mL/min — AB (ref >90)
Glucose, Bld: 122 mg/dL — ABNORMAL HIGH (ref 70–99)
Potassium: 3.7 mmol/L (ref 3.5–5.1)
Sodium: 134 mmol/L — ABNORMAL LOW (ref 135–145)
Total Protein: 6.4 g/dL (ref 6.0–8.3)

## 2014-11-24 LAB — TYPE AND SCREEN
ABO/RH(D): O POS
Antibody Screen: NEGATIVE
UNIT DIVISION: 0

## 2014-11-24 LAB — GLUCOSE, CAPILLARY: Glucose-Capillary: 81 mg/dL (ref 70–99)

## 2014-11-24 LAB — PHOSPHORUS: Phosphorus: 6.1 mg/dL — ABNORMAL HIGH (ref 2.3–4.6)

## 2014-11-24 LAB — MAGNESIUM: MAGNESIUM: 2 mg/dL (ref 1.5–2.5)

## 2014-11-24 MED ORDER — OXYCODONE-ACETAMINOPHEN 5-325 MG PO TABS: 1.0000 | ORAL_TABLET | Freq: Four times a day (QID) | ORAL | Status: DC | PRN

## 2014-11-24 MED ORDER — PANTOPRAZOLE SODIUM 40 MG PO TBEC: 40.0000 mg | DELAYED_RELEASE_TABLET | Freq: Two times a day (BID) | ORAL | Status: DC

## 2014-11-24 NOTE — Progress Notes (Signed)
Patient states that she will need help with a ride home as well as a way to get medications once she is discharge. Will pass on to day RN to get social work consult.

## 2014-11-24 NOTE — Discharge Summary (Signed)
[name], is a 49 y.o. female  DOB [birth year]  MRN [id number].  Admission date:  11/22/2014  Admitting Physician  Hosie Poisson, MD  Discharge Date:  11/24/2014   Primary MD  [name], MD  Recommendations for primary care physician for things to follow:   The CBC BMP closely. Needs outpatient GI, orthopedics follow-up along with close follow-up with her nephrologist.   Admission Diagnosis  Fatigue [R53.83] CVA (cerebral infarction) [I63.9] Acute on chronic renal failure [N17.9, N18.9] Unilateral visual loss [H54.60] Anemia, unspecified anemia type [D64.9]   Discharge Diagnosis  Fatigue [R53.83] CVA (cerebral infarction) [I63.9] Acute on chronic renal failure [N17.9, N18.9] Unilateral visual loss [H54.60] Anemia, unspecified anemia type [D64.9]     Principal Problem:   Symptomatic anemia Active Problems:   Seizure disorder   Hep C w/o coma, chronic   CKD (chronic kidney disease) stage 5, GFR less than 15 ml/min   Anemia in CKD (chronic kidney disease)   Bipolar disorder   Anemia, iron deficiency   Chronic combined systolic and diastolic CHF (congestive heart failure)   Benign essential HTN   Unilateral visual loss   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      Past Medical History  Diagnosis Date  . Neuropathy   . Glaucoma   . Bipolar 1 disorder   . Refusal of blood transfusions as patient is Jehovah's Witness   . Shortness of breath   . TIA (transient ischemic attack) 2010  . Neuromuscular disorder     DIABETIC NEUROPATHY  . MRSA infection ?2014    "on the back of my head; spread throughout my bloodstream"  . Bipolar disorder, unspecified 02/06/2014  . High cholesterol   . Type II diabetes mellitus     INSULIN DEPENDENT  . Hepatitis C   . Chronic pain syndrome   . Chronic  kidney disease (CKD), stage III (moderate)   . Schizoaffective disorder, unspecified condition 02/08/2014  . Seizure dx'd 2014    "don't know what kind; last one was ~ 04/2014"  . Chronic combined systolic and diastolic CHF (congestive heart failure)     EF 40% by echo and 48% by stress test  . Anemia   . Hypertension   . Stroke     hx TIA  . Neuromuscular disorder     diabetic neuropathy  . Shortness of breath dyspnea   . Schizophrenia   . Seizures   . Hepatitis     hep C  . Bipolar disorder   . Glaucoma   . Chronic pain   . Diabetes mellitus without complication     on Insulin  . Depression   . Anxiety   . Chronic kidney disease     chronic kidney stage 3 Dr Mercy Moore  . GERD (gastroesophageal reflux disease)   . History of hiatal hernia   . Headache     sinus headaches    Past Surgical History  Procedure Laterality Date  . Amputation Left 05/21/2013    Procedure:  Left Midfoot AMPUTATION;  Surgeon: Newt Minion, MD;  Location: [facility];  Service: Orthopedics;  Laterality: Left;  Left Midfoot Amputation  . Tubal ligation    . Colonoscopy with propofol N/A 06/22/2013    Procedure: COLONOSCOPY WITH PROPOFOL;  Surgeon: Jeryl Columbia, MD;  Location: [facility];  Service: Endoscopy;  Laterality: N/A;  . Abdominal hysterectomy  2014  . Tonsillectomy  1970's  . Refractive surgery Bilateral 2013  . Tubal ligation  2000  . Abdominal hysterectomy  2010  . Foot amputation Left     due diabetic neuropathy, could not feel ulcer on bottom of foot  . Eye surgery Bilateral 2010    Lasik       History of present illness and  Hospital Course:     Kindly see H&P for history of present illness and admission details, please review complete Labs, Consult reports and Test reports for all details in brief  HPI  from the history and physical done on the day of admission  49 year old female patient with multiple medical problems including chronic kidney disease stage V with associated  chronic anemia with iron deficiency component. She also has diabetes mellitus and is on insulin, hypertension, bipolar disorder and schizoaffective disorder, hepatitis C, chronic pain and history of prior TIA. Patient persisted to the ER reports of acute onset of blurred vision right eye and gait instability. She reports she was last normal yesterday evening around 10 PM when she went to bed. She initially awakened at 1 AM and noticed blurred vision in the right eye and felt that the visual field was narrowing. She went back to bed and when she awakened this morning her vision was worse. She reported difficulty walking related to bilateral leg heaviness but reports this is also a chronic issue related to her chronic anemia. At time of presentation to the ER the patient's systolic blood pressure was greater than 200 and her CBG was 468.  Since arrival to the ER patient has been evaluated by neurology. P CT of the head was unremarkable. Neurology reports no further stroke workup indicated at this time. Recommended considering ophthalmologically evaluation for unilateral blurred vision. Additional evaluation by the ER physician revealed slightly worsened anemia. Renal function was also slightly worsened with a BUN of 93 and a creatinine of 6.3 where one week prior creatinine had been 4.98 and a BUN of 60. Hemoglobin was also lower at 7.6. And although her stool was brown and it was Hemoccult positive. Patient also had hyponatremia with a sodium of 127. Her lactic acid was normal at 1.33 and troponin was normal at 0.01. Her chest x-ray did not demonstrate any heart failure. In addition to the above complaints patient is also reporting generalized fatigue with ambulation without shortness of breath or chest pain. He denies orthopnea or dyspnea on exertion. She's not had any black or bloody stools. She she does report darker stools in the setting of taking oral iron regularly. She reports she is scheduled to see Dr.  Mercy Moore next week.  Hospital Course   1. Symptomatic anemia. Combination of iron deficiency and anemia of chronic disease. She did use NSAIDs liberally, weakly Hemoccult positive, anemia panel is stable, will be placed on oral PPI, NSAID discontinued, and new home and supplementation, this admission she was agreeable for 1 unit of packed RBC transfusion which was given on 11/23/2014 and subsequent H&H is stable. Will be discharged on PPI and iron with outpatient GI follow-up.  2. 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 Baseline creatinine of close to 4.5 to 5.  He declined further due to anemia and likely NSAID use. Has been transfused and NSAID has been discontinued. She has been refusing dialysis and refuses dialysis even today, she says she would rather die than undergo dialysis. Renal function seems to have plateaued current creatinine is 6.1. Potassium stable she is not uremic, we'll discharge her with close outpatient renal follow-up.   3. Blurry vision upon admission. Seen by neurology. Stroke workup unremarkable which included CT head, MRI/MRA brain. Was also seen by ophthalmology just who cleared her. Vision is back to normal. Continue on statin and aspirin. Outpatient ophthalmology follow-up as needed. Question blurry vision due to hyperglycemia and poor glycemic control.   4. DM type II. On Lantus and  NovoLog, in poor control due to poor compliance A1c was 9.8. Request PCP to monitor CBGs closely..            5. Essential hypertension. Blood pressure in good control, continue combination of clonidine, Norvasc and diuretic.   6. Dyslipidemia on statin          Discharge Condition: Stable   Follow UP  Follow-up Information    Follow up with WEBB, CAROL D, MD. Schedule an appointment as soon as possible for a visit on 12/02/2014.   Specialty:  Family Medicine   Why:  Appointment with Dr. Justin Mend is on 12/02/14 at 2pm   Contact information:   [address] [address] [phone]       Follow up with Johnny Bridge, MD. Schedule an appointment as soon as possible for a visit on 11/28/2014.   Specialty:  Orthopedic Surgery   Why:  R elbow fracture/  Appointment with Dr. Mardelle Matte is on 11/28/14 at 9:45am   Contact information:   [address] [address] [phone]       Follow up with Silvano Rusk, MD. Schedule an appointment as soon as possible for a visit on 12/12/2014.   Specialty:  Gastroenterology   Why:  Blood in stool/   Appointment with Dr. Trellis Paganini  is on 12/12/14 at 10:30    Contact information:   [address] [place] [address] [phone]       Follow up with MATTINGLY,MICHAEL T, MD. Schedule an appointment as soon as possible for a visit in 1 week.   Specialty:  Nephrology   Why:  CKD 5/ Doctor office will call patient with an appointment   Contact information:   [place] [address] [phone]       Follow up with [facility]     On 11/29/2014.   Why:  09:30 am   Contact information:   [place] [phone]        Discharge Instructions  and  Discharge Medications      Discharge Instructions    Discharge instructions    Complete by:  As directed   Follow with Primary MD WEBB, CAROL D, MD in 7 days   Get CBC, CMP, 2 view Chest X ray checked  by Primary MD next visit.    Activity: As tolerated with Full fall precautions use walker/cane & assistance as needed   Disposition Home     Diet: Renal - Check your Weight same time everyday, if you gain over 2 pounds, or you develop in  leg swelling, experience more shortness of breath or chest pain, call your Primary MD immediately. Follow Cardiac Low Salt Diet and 1.5 lit/day fluid restriction.   On your next visit with your primary care physician please Get Medicines reviewed and adjusted.   Please  request your Prim.MD to go over all Hospital Tests and Procedure/Radiological results at the follow up, please get all Hospital records sent to your Prim MD by signing hospital release before you go home.   If you experience worsening of your admission symptoms, develop shortness of breath, life threatening emergency, suicidal or homicidal thoughts you must seek medical attention immediately by calling 911 or calling your MD immediately  if symptoms less severe.  You Must read complete instructions/literature along with all the possible adverse reactions/side effects for all the Medicines you take and that have been prescribed to you. Take any new Medicines after you have completely understood and accpet all the possible adverse reactions/side effects.   Do not drive, operating heavy machinery, perform activities at heights, swimming or participation in water activities or provide baby sitting services if your were admitted for syncope or siezures until you have seen by Primary MD or a Neurologist and advised to do so again.  Do not drive when taking Pain medications.    Do not take more than prescribed Pain, Sleep and Anxiety Medications  Special Instructions: If you have smoked or chewed Tobacco  in the last 2 yrs please stop smoking, stop any regular Alcohol  and or any Recreational drug use.  Wear Seat belts while driving.   Please note  You were cared for by a hospitalist during your hospital stay. If you have any questions about your discharge medications or the care you received while you were in the hospital after you are discharged, you can call the unit and asked to speak with the hospitalist on call if the hospitalist that took care of you is not available. Once you are discharged, your primary care physician will handle any further medical issues. Please note that NO REFILLS for any discharge medications will be authorized once you are discharged, as it is imperative that you return  to your primary care physician (or establish a relationship with a primary care physician if you do not have one) for your aftercare needs so that they can reassess your need for medications and monitor your lab values.     Increase activity slowly    Complete by:  As directed             Medication List    STOP taking these medications        naproxen sodium 220 MG tablet  Commonly known as:  ANAPROX      TAKE these medications        acetaminophen 500 MG tablet  Commonly known as:  TYLENOL  Take 500 mg by mouth every 6 (six) hours as needed for mild pain or moderate pain.     Acidophilus 100 MG Caps  Take 100 mg by mouth daily.     amLODipine 5 MG tablet  Commonly known as:  NORVASC  Take 5 mg by mouth daily.     aspirin 325 MG EC tablet  Take 1 tablet (325 mg total) by mouth daily.     atorvastatin 40 MG tablet  Commonly known as:  LIPITOR  Take 1 tablet (40 mg total) by mouth daily at 6 PM.     carvedilol 25 MG tablet  Commonly known  as:  COREG  Take 1 tablet (25 mg total) by mouth 2 (two) times daily.     cloNIDine 0.2 MG tablet  Commonly known as:  CATAPRES  Take 0.2 mg by mouth at bedtime.     Fe Bisgly-Vit C-Vit B12-FA 28-60-0.008-0.4 MG Caps  Take 1 tablet by mouth daily.     ferrous sulfate 325 (65 FE) MG tablet  Take 1 tablet (325 mg total) by mouth daily with breakfast.     folic acid A999333 MCG tablet  Commonly known as:  FOLVITE  Take 200 mcg by mouth every morning.     furosemide 40 MG tablet  Commonly known as:  LASIX  Take 2 tablets (80 mg total) by mouth 2 (two) times daily.     insulin aspart 100 UNIT/ML injection  Commonly known as:  novoLOG  Inject 2-10 Units into the skin 3 (three) times daily before meals. SSI: 0-200 = 2 units, 201-250 = 4 units, 251-300 = 6 units, 301-350 = 8 units, 351-400 = 10 units     insulin glargine 100 UNIT/ML injection  Commonly known as:  LANTUS  Inject 0.36 mLs (36 Units total) into the skin daily.      metoCLOPramide 10 MG tablet  Commonly known as:  REGLAN  Take 1 tablet (10 mg total) by mouth every 8 (eight) hours as needed for nausea.     multivitamin with minerals Tabs tablet  Take 1 tablet by mouth daily.     mupirocin cream 2 %  Commonly known as:  BACTROBAN  Apply topically daily.     oxyCODONE-acetaminophen 5-325 MG per tablet  Commonly known as:  PERCOCET/ROXICET  Take 1 tablet by mouth every 6 (six) hours as needed for moderate pain.     pantoprazole 40 MG tablet  Commonly known as:  PROTONIX  Take 1 tablet (40 mg total) by mouth 2 (two) times daily.     polyethylene glycol packet  Commonly known as:  MIRALAX / GLYCOLAX  Take 17 g by mouth 2 (two) times daily. Mix in 8 oz liquid and drink          Diet and Activity recommendation: See Discharge Instructions above   Consults obtained - renal   Major procedures and Radiology Reports - PLEASE review detailed and final reports for all details, in brief -       Dg Chest 2 View  11/22/2014   CLINICAL DATA:  Fatigue off and on for 1 year worsening, headache for 2 days, visual changes RIGHT side, history kidney disease, anemia, CHF, diabetes, hypertension, stroke, hepatitis-C  EXAM: CHEST  2 VIEW  COMPARISON:  09/30/2014  FINDINGS: Upper normal heart size.  Mediastinal contours and pulmonary vascularity normal.  Lungs clear.  No pleural effusion or pneumothorax.  Bones unremarkable.  IMPRESSION: No acute abnormalities.   Electronically Signed   By: Lavonia Dana M.D.   On: 11/22/2014 09:20   Ct Head Wo Contrast  11/22/2014   CLINICAL DATA:  Code stroke. Leg weakness and heaviness. Right-sided headache. Abnormal vision right eye.  EXAM: CT HEAD WITHOUT CONTRAST  TECHNIQUE: Contiguous axial images were obtained from the base of the skull through the vertex without intravenous contrast.  COMPARISON:  MRI 11/10/2014.  CT 11/10/2014  FINDINGS: Symmetric enlargement of the sylvian fissure bilaterally compatible with significant  atrophy. This is unchanged. Negative for hydrocephalus. Benign cyst in the left basal ganglia stable. Mild chronic microvascular ischemic changes in the white matter. Chronic encephalomalacia left occipital lobe  unchanged.  Negative for acute infarct. Negative for hemorrhage or mass. No change from prior studies.  IMPRESSION: Atrophy and chronic microvascular ischemic changes. No acute abnormality and no change from the prior exams.  Critical Value/emergent results were called by telephone at the time of interpretation on 11/22/2014 at 7:52 am to Dr. Jim Like , who verbally acknowledged these results.   Electronically Signed   By: Franchot Gallo M.D.   On: 11/22/2014 07:52   Ct Head Wo Contrast  11/10/2014   CLINICAL DATA:  Right leg weakness  EXAM: CT HEAD WITHOUT CONTRAST  TECHNIQUE: Contiguous axial images were obtained from the base of the skull through the vertex without intravenous contrast.  COMPARISON:  11/09/2014  FINDINGS: No skull fracture is noted. Paranasal sinuses and mastoid air cells are unremarkable.  No intracranial hemorrhage, mass effect or midline shift. Again noted old lacunar infarct in left basal ganglia.  Stable mild cerebral atrophy. Stable mild periventricular and patchy subcortical chronic white matter disease. No definite acute cortical infarction. No mass lesion is noted on this unenhanced scan.  IMPRESSION: No definite acute cortical infarction. Stable atrophy and chronic mild white matter disease. 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   Electronically Signed   By: Lahoma Crocker M.D.   On: 11/10/2014 10:20   Ct Head Wo Contrast  11/09/2014   CLINICAL DATA:  Dizziness for 4 days.  Nausea and vomiting.  EXAM: CT HEAD WITHOUT CONTRAST  TECHNIQUE: Contiguous axial images were obtained from the base of the skull through the vertex without intravenous contrast.  COMPARISON:  MRI brain 02/02/2014.  CT head 02/02/2014.  FINDINGS: Diffuse cerebral atrophy. Prominent  atrophy along the sylvian fissure bilaterally. Mild ventricular dilatation consistent with central atrophy. Low-attenuation changes in the deep white matter consistent with small vessel ischemia. Old lacunar infarct in the left basal ganglia. No mass effect or midline shift. No abnormal extra-axial fluid collections. Gray-white matter junctions are distinct. Basal cisterns are not effaced. No evidence of acute intracranial hemorrhage. No depressed skull fractures. Visualized paranasal sinuses and mastoid air cells are not opacified.  IMPRESSION: No acute intracranial abnormalities. Chronic atrophy and small vessel ischemic changes.   Electronically Signed   By: Lucienne Capers M.D.   On: 11/09/2014 01:40   Mr Jodene Nam Head Wo Contrast  11/22/2014   CLINICAL DATA:  Acute onset of blurred vision in the RIGHT eye with gait instability. History of bipolar disorder, diabetes, hypertension, and glaucoma. Initial encounter.  EXAM: MRI HEAD WITHOUT CONTRAST  MRA HEAD WITHOUT CONTRAST  TECHNIQUE: Multiplanar, multiecho pulse sequences of the brain and surrounding structures were obtained without intravenous contrast. Angiographic images of the head were obtained using MRA technique without contrast.  COMPARISON:  CT head earlier today. MR head 11/10/2014. Also prior MR head 02/02/2014. In  FINDINGS: MRI HEAD FINDINGS  Unchanged symmetric temporoparietal atrophy with marked prominence of the central sulcus and sylvian fissures. Mild to moderate small vessel disease is also stable. No vascular occlusion. No areas of chronic hemorrhage. No midline abnormality. In extracranial soft tissues unremarkable. BILATERAL cataract extraction without visible acute abnormality of the globe.  MRA HEAD FINDINGS  The internal carotid arteries are widely patent. The basilar artery is widely patent with vertebrals codominant. No intracranial stenosis or aneurysm.  IMPRESSION: Stable severe atrophy.  No acute intracranial findings.  Negative MRA  intracranial.   Electronically Signed   By: Rolla Flatten M.D.   On: 11/22/2014 19:38   Mr Brain Lottie Dawson  Contrast  11/22/2014   CLINICAL DATA:  Acute onset of blurred vision in the RIGHT eye with gait instability. History of bipolar disorder, diabetes, hypertension, and glaucoma. Initial encounter.  EXAM: MRI HEAD WITHOUT CONTRAST  MRA HEAD WITHOUT CONTRAST  TECHNIQUE: Multiplanar, multiecho pulse sequences of the brain and surrounding structures were obtained without intravenous contrast. Angiographic images of the head were obtained using MRA technique without contrast.  COMPARISON:  CT head earlier today. MR head 11/10/2014. Also prior MR head 02/02/2014. In  FINDINGS: MRI HEAD FINDINGS  Unchanged symmetric temporoparietal atrophy with marked prominence of the central sulcus and sylvian fissures. Mild to moderate small vessel disease is also stable. No vascular occlusion. No areas of chronic hemorrhage. No midline abnormality. In extracranial soft tissues unremarkable. BILATERAL cataract extraction without visible acute abnormality of the globe.  MRA HEAD FINDINGS  The internal carotid arteries are widely patent. The basilar artery is widely patent with vertebrals codominant. No intracranial stenosis or aneurysm.  IMPRESSION: Stable severe atrophy.  No acute intracranial findings.  Negative MRA intracranial.   Electronically Signed   By: Rolla Flatten M.D.   On: 11/22/2014 19:38   Mr Brain Wo Contrast  11/10/2014   CLINICAL DATA:  Seizure disorder. Nausea and vomiting. Prior history of stroke. Initial encounter.  EXAM: MRI HEAD WITHOUT CONTRAST  TECHNIQUE: Multiplanar, multiecho pulse sequences of the brain and surrounding structures were obtained without intravenous contrast.  COMPARISON:  CT head 11/10/2014.  MR head 02/02/2014.  FINDINGS: No evidence for acute infarction, hemorrhage, mass lesion, hydrocephalus, or extra-axial fluid.  Fairly symmetric temporoparietal atrophy with marked prominence of the central  sulcus and sylvian fissures. Mild to moderate small vessel disease is stable. Flow voids are maintained throughout the carotid, basilar, and vertebral arteries. There are no areas of chronic hemorrhage. Pituitary, pineal, and cerebellar tonsils unremarkable. No upper cervical lesions. No asymmetric Hippocampal insult or volume loss.  IMPRESSION: Severe atrophy is stable.  No acute intracranial findings.   Electronically Signed   By: Rolla Flatten M.D.   On: 11/10/2014 13:16   US Renal  11/09/2014   CLINICAL DATA:  Initial evaluation for acute renal injury. History of chronic stage III renal disease, hypertension, diabetes, hep C.  EXAM: RENAL/URINARY TRACT ULTRASOUND COMPLETE  COMPARISON:  Prior study from 07/06/2014  FINDINGS: Right Kidney:  Length: 11.1 cm. Renal cortex diffusely echogenic in appearance, suggesting underlying medical renal disease. No mass or hydronephrosis visualized.  Left Kidney:  Length: 11.3 cm. Renal cortex diffusely echogenic in appearance, suggesting underlying medical renal disease. No mass or hydronephrosis visualized.  Bladder:  Appears normal for degree of bladder distention. Bilateral ureteral jets present.  IMPRESSION: Echogenic kidneys bilaterally, suggesting underlying medical renal disease. No hydronephrosis or other acute abnormality.   Electronically Signed   By: Jeannine Boga M.D.   On: 11/09/2014 03:08    Micro Results      Recent Results (from the past 240 hour(s))  MRSA PCR Screening     Status: None   Collection Time: 11/22/14 12:50 PM  Result Value Ref Range Status   MRSA by PCR NEGATIVE NEGATIVE Final    Comment:        The GeneXpert MRSA Assay (FDA approved for NASAL specimens only), is one component of a comprehensive MRSA colonization surveillance program. It is not intended to diagnose MRSA infection nor to guide or monitor treatment for MRSA infections.        Today   Subjective:   [name] today  has no headache,no chest  abdominal pain,no new weakness tingling or numbness, feels much better wants to go home today.    Objective:   Blood pressure 139/86, pulse 80, temperature 97.4 F (36.3 C), temperature source Oral, resp. rate 18, height 5\' 4"  (1.626 m), weight 72.9 kg (160 lb 11.5 oz), SpO2 100 %.   Intake/Output Summary (Last 24 hours) at 11/24/14 1102 Last data filed at 11/24/14 0920  Gross per 24 hour  Intake   1036 ml  Output   1050 ml  Net    -14 ml    Exam Awake Alert, Oriented x 3, No new F.N deficits, Normal affect [place].AT,PERRAL Supple Neck,No JVD, No cervical lymphadenopathy appriciated.  Symmetrical Chest wall movement, Good air movement bilaterally, CTAB RRR,No Gallops,Rubs or new Murmurs, No Parasternal Heave +ve B.Sounds, Abd Soft, Non tender, No organomegaly appriciated, No rebound -guarding or rigidity. No Cyanosis, Clubbing or edema, No new Rash or bruise  Data Review   CBC w Diff: Lab Results  Component Value Date   WBC 4.4 11/24/2014   WBC 5.0 07/20/2013   HGB 10.1* 11/24/2014   HGB 9.3* 07/20/2013   HCT 30.1* 11/24/2014   HCT 28.8* 07/20/2013   PLT 193 11/24/2014   PLT 271 07/20/2013   LYMPHOPCT 41 11/22/2014   LYMPHOPCT 45.4 07/20/2013   MONOPCT 8 11/22/2014   MONOPCT 6.4 07/20/2013   EOSPCT 2 11/22/2014   EOSPCT 1.6 07/20/2013   BASOPCT 0 11/22/2014   BASOPCT 0.7 07/20/2013    CMP: Lab Results  Component Value Date   NA 134* 11/24/2014   K 3.7 11/24/2014   CL 98 11/24/2014   CO2 24 11/24/2014   BUN 92* 11/24/2014   CREATININE 6.10* 11/24/2014   PROT 6.4 11/24/2014   ALBUMIN 2.5* 11/24/2014   BILITOT 0.6 11/24/2014   ALKPHOS 81 11/24/2014   AST 51* 11/24/2014   ALT 55* 11/24/2014  .  Lab Results  Component Value Date   HGBA1C 9.8* 08/25/2014     Total Time in preparing paper work, data evaluation and todays exam - 35 minutes  Thurnell Lose M.D on 11/24/2014 at 11:02 AM  [facility]   Office  [phone]

## 2014-11-24 NOTE — Progress Notes (Signed)
NURSING PROGRESS NOTE  DAMIAH WORD [id number] Discharge Data: 11/24/2014 12:04 PM Attending Provider: Thurnell Lose, MD [id number], Valla Leaver, MD     [name] to be D/C'd Home per MD order.  Discussed with the patient the After Visit Summary and all questions fully answered. All IV's discontinued with no bleeding noted. All belongings returned to patient for patient to take home.   Last Vital Signs:  Blood pressure 139/86, pulse 80, temperature 97.4 F (36.3 C), temperature source Oral, resp. rate 18, height 5\' 4"  (1.626 m), weight 72.9 kg (160 lb 11.5 oz), SpO2 100 %.  Discharge Medication List   Medication List    STOP taking these medications        naproxen sodium 220 MG tablet  Commonly known as:  ANAPROX      TAKE these medications        acetaminophen 500 MG tablet  Commonly known as:  TYLENOL  Take 500 mg by mouth every 6 (six) hours as needed for mild pain or moderate pain.     Acidophilus 100 MG Caps  Take 100 mg by mouth daily.     amLODipine 5 MG tablet  Commonly known as:  NORVASC  Take 5 mg by mouth daily.     aspirin 325 MG EC tablet  Take 1 tablet (325 mg total) by mouth daily.     atorvastatin 40 MG tablet  Commonly known as:  LIPITOR  Take 1 tablet (40 mg total) by mouth daily at 6 PM.     carvedilol 25 MG tablet  Commonly known as:  COREG  Take 1 tablet (25 mg total) by mouth 2 (two) times daily.     cloNIDine 0.2 MG tablet  Commonly known as:  CATAPRES  Take 0.2 mg by mouth at bedtime.     Fe Bisgly-Vit C-Vit B12-FA 28-60-0.008-0.4 MG Caps  Take 1 tablet by mouth daily.     ferrous sulfate 325 (65 FE) MG tablet  Take 1 tablet (325 mg total) by mouth daily with breakfast.     folic acid A999333 MCG tablet  Commonly known as:  FOLVITE  Take 200 mcg by mouth every morning.     furosemide 40 MG tablet  Commonly known as:  LASIX  Take 2 tablets (80 mg total) by mouth 2 (two) times daily.     insulin aspart 100 UNIT/ML injection  Commonly  known as:  novoLOG  Inject 2-10 Units into the skin 3 (three) times daily before meals. SSI: 0-200 = 2 units, 201-250 = 4 units, 251-300 = 6 units, 301-350 = 8 units, 351-400 = 10 units     insulin glargine 100 UNIT/ML injection  Commonly known as:  LANTUS  Inject 0.36 mLs (36 Units total) into the skin daily.     metoCLOPramide 10 MG tablet  Commonly known as:  REGLAN  Take 1 tablet (10 mg total) by mouth every 8 (eight) hours as needed for nausea.     multivitamin with minerals Tabs tablet  Take 1 tablet by mouth daily.     mupirocin cream 2 %  Commonly known as:  BACTROBAN  Apply topically daily.     oxyCODONE-acetaminophen 5-325 MG per tablet  Commonly known as:  PERCOCET/ROXICET  Take 1 tablet by mouth every 6 (six) hours as needed for moderate pain.     pantoprazole 40 MG tablet  Commonly known as:  PROTONIX  Take 1 tablet (40 mg total) by mouth 2 (two) times  daily.     polyethylene glycol packet  Commonly known as:  MIRALAX / GLYCOLAX  Take 17 g by mouth 2 (two) times daily. Mix in 8 oz liquid and drink         Leibish Mcgregor, RN

## 2014-11-24 NOTE — Discharge Instructions (Signed)
Follow with Primary MD WEBB, CAROL D, MD in 7 days   Get CBC, CMP, 2 view Chest X ray checked  by Primary MD next visit.    Activity: As tolerated with Full fall precautions use walker/cane & assistance as needed   Disposition Home     Diet: Renal - Check your Weight same time everyday, if you gain over 2 pounds, or you develop in leg swelling, experience more shortness of breath or chest pain, call your Primary MD immediately. Follow Cardiac Low Salt Diet and 1.5 lit/day fluid restriction.   On your next visit with your primary care physician please Get Medicines reviewed and adjusted.   Please request your Prim.MD to go over all Hospital Tests and Procedure/Radiological results at the follow up, please get all Hospital records sent to your Prim MD by signing hospital release before you go home.   If you experience worsening of your admission symptoms, develop shortness of breath, life threatening emergency, suicidal or homicidal thoughts you must seek medical attention immediately by calling 911 or calling your MD immediately  if symptoms less severe.  You Must read complete instructions/literature along with all the possible adverse reactions/side effects for all the Medicines you take and that have been prescribed to you. Take any new Medicines after you have completely understood and accpet all the possible adverse reactions/side effects.   Do not drive, operating heavy machinery, perform activities at heights, swimming or participation in water activities or provide baby sitting services if your were admitted for syncope or siezures until you have seen by Primary MD or a Neurologist and advised to do so again.  Do not drive when taking Pain medications.    Do not take more than prescribed Pain, Sleep and Anxiety Medications  Special Instructions: If you have smoked or chewed Tobacco  in the last 2 yrs please stop smoking, stop any regular Alcohol  and or any Recreational drug  use.  Wear Seat belts while driving.   Please note  You were cared for by a hospitalist during your hospital stay. If you have any questions about your discharge medications or the care you received while you were in the hospital after you are discharged, you can call the unit and asked to speak with the hospitalist on call if the hospitalist that took care of you is not available. Once you are discharged, your primary care physician will handle any further medical issues. Please note that NO REFILLS for any discharge medications will be authorized once you are discharged, as it is imperative that you return to your primary care physician (or establish a relationship with a primary care physician if you do not have one) for your aftercare needs so that they can reassess your need for medications and monitor your lab values.

## 2014-11-25 ENCOUNTER — Ambulatory Visit (HOSPITAL_COMMUNITY)
Admission: RE | Admit: 2014-11-25 | Discharge: 2014-11-25 | Disposition: A | Discharge status: Home / Self Care | Payer: Medicare Other | Source: Ambulatory Visit | Attending: Nephrology | Admitting: Nephrology

## 2014-11-25 DIAGNOSIS — Z79899 Other long term (current) drug therapy: Secondary | ICD-10-CM | POA: Diagnosis not present

## 2014-11-25 DIAGNOSIS — D631 Anemia in chronic kidney disease: Secondary | ICD-10-CM | POA: Diagnosis not present

## 2014-11-25 DIAGNOSIS — D509 Iron deficiency anemia, unspecified: Secondary | ICD-10-CM | POA: Insufficient documentation

## 2014-11-25 DIAGNOSIS — Z5181 Encounter for therapeutic drug level monitoring: Secondary | ICD-10-CM | POA: Diagnosis not present

## 2014-11-25 DIAGNOSIS — N184 Chronic kidney disease, stage 4 (severe): Secondary | ICD-10-CM | POA: Diagnosis not present

## 2014-11-25 LAB — IRON AND TIBC
IRON: 77 ug/dL (ref 42–145)
SATURATION RATIOS: 29 % (ref 20–55)
TIBC: 267 ug/dL (ref 250–470)
UIBC: 190 ug/dL (ref 125–400)

## 2014-11-25 LAB — POCT HEMOGLOBIN-HEMACUE: Hemoglobin: 9.3 g/dL — ABNORMAL LOW (ref 12.0–15.0)

## 2014-11-25 MED ORDER — DARBEPOETIN ALFA 100 MCG/0.5ML IJ SOSY
100.0000 ug | PREFILLED_SYRINGE | INTRAMUSCULAR | Status: DC
  Administered 2014-11-25: 100 ug via SUBCUTANEOUS

## 2014-11-25 MED ORDER — DARBEPOETIN ALFA 100 MCG/0.5ML IJ SOSY
PREFILLED_SYRINGE | INTRAMUSCULAR | Status: AC
  Administered 2014-11-25: 100 ug via SUBCUTANEOUS
  Filled 2014-11-25: qty 0.5

## 2014-11-25 MED ORDER — FERUMOXYTOL INJECTION 510 MG/17 ML
510.0000 mg | INTRAVENOUS | Status: AC
  Administered 2014-11-25: 510 mg via INTRAVENOUS
  Filled 2014-11-25: qty 17

## 2014-11-26 LAB — FERRITIN: Ferritin: 1070 ng/mL — ABNORMAL HIGH (ref 10–291)

## 2014-11-28 ENCOUNTER — Other Ambulatory Visit: Payer: Self-pay | Admitting: Internal Medicine

## 2014-11-29 ENCOUNTER — Inpatient Hospital Stay: Payer: Self-pay | Admitting: Family Medicine

## 2014-11-30 ENCOUNTER — Inpatient Hospital Stay: Payer: Self-pay | Admitting: Family Medicine

## 2014-12-02 ENCOUNTER — Encounter (HOSPITAL_COMMUNITY): Payer: Self-pay

## 2014-12-06 ENCOUNTER — Inpatient Hospital Stay: Payer: Self-pay | Admitting: Family Medicine

## 2014-12-08 ENCOUNTER — Inpatient Hospital Stay (HOSPITAL_COMMUNITY): Admission: RE | Admit: 2014-12-08 | Payer: Self-pay | Source: Ambulatory Visit

## 2014-12-12 ENCOUNTER — Ambulatory Visit: Payer: Self-pay | Admitting: Physician Assistant

## 2014-12-13 ENCOUNTER — Encounter: Payer: Self-pay | Admitting: Family Medicine

## 2014-12-13 ENCOUNTER — Ambulatory Visit: Payer: Medicare Other | Attending: Family Medicine | Admitting: Family Medicine

## 2014-12-13 VITALS — BP 169/94 | HR 75 | Temp 97.8°F | Resp 18 | Ht 64.0 in | Wt 170.0 lb

## 2014-12-13 DIAGNOSIS — Z794 Long term (current) use of insulin: Secondary | ICD-10-CM | POA: Diagnosis not present

## 2014-12-13 DIAGNOSIS — E119 Type 2 diabetes mellitus without complications: Secondary | ICD-10-CM | POA: Diagnosis present

## 2014-12-13 DIAGNOSIS — F259 Schizoaffective disorder, unspecified: Secondary | ICD-10-CM | POA: Insufficient documentation

## 2014-12-13 DIAGNOSIS — D631 Anemia in chronic kidney disease: Secondary | ICD-10-CM | POA: Diagnosis not present

## 2014-12-13 DIAGNOSIS — S42201K Unspecified fracture of upper end of right humerus, subsequent encounter for fracture with nonunion: Secondary | ICD-10-CM | POA: Diagnosis not present

## 2014-12-13 DIAGNOSIS — N189 Chronic kidney disease, unspecified: Secondary | ICD-10-CM

## 2014-12-13 DIAGNOSIS — N185 Chronic kidney disease, stage 5: Secondary | ICD-10-CM | POA: Diagnosis not present

## 2014-12-13 DIAGNOSIS — I10 Essential (primary) hypertension: Secondary | ICD-10-CM | POA: Insufficient documentation

## 2014-12-13 DIAGNOSIS — Z89432 Acquired absence of left foot: Secondary | ICD-10-CM | POA: Insufficient documentation

## 2014-12-13 DIAGNOSIS — F3160 Bipolar disorder, current episode mixed, unspecified: Secondary | ICD-10-CM | POA: Diagnosis not present

## 2014-12-13 DIAGNOSIS — I639 Cerebral infarction, unspecified: Secondary | ICD-10-CM

## 2014-12-13 DIAGNOSIS — K5909 Other constipation: Secondary | ICD-10-CM | POA: Insufficient documentation

## 2014-12-13 LAB — POCT URINALYSIS DIPSTICK
BILIRUBIN UA: NEGATIVE
Glucose, UA: 500
Ketones, UA: NEGATIVE
Leukocytes, UA: NEGATIVE
Nitrite, UA: NEGATIVE
PH UA: 6
Protein, UA: >=300
Spec Grav, UA: 1.02
Urobilinogen, UA: 0.2

## 2014-12-13 LAB — POCT GLYCOSYLATED HEMOGLOBIN (HGB A1C): Hemoglobin A1C: 9.4

## 2014-12-13 LAB — GLUCOSE, POCT (MANUAL RESULT ENTRY)
POC Glucose: 416 mg/dl — AB (ref 70–99)
POC Glucose: 379 mg/dl — AB (ref 70–99)

## 2014-12-13 MED ORDER — CLONIDINE HCL 0.2 MG PO TABS: 0.2000 mg | ORAL_TABLET | Freq: Every day | ORAL | Status: DC

## 2014-12-13 MED ORDER — LACTULOSE 10 GM/15ML PO SOLN
10.0000 g | Freq: Three times a day (TID) | ORAL | Status: DC
Start: 2014-12-13 — End: 2015-07-17

## 2014-12-13 MED ORDER — ACCU-CHEK AVIVA PLUS W/DEVICE KIT: 1.0000 | PACK | Freq: Three times a day (TID) | Status: DC

## 2014-12-13 MED ORDER — CLONIDINE HCL 0.1 MG PO TABS
0.1000 mg | ORAL_TABLET | Freq: Once | ORAL | Status: AC
  Administered 2014-12-13: 0.1 mg via ORAL

## 2014-12-13 MED ORDER — AMLODIPINE BESYLATE 10 MG PO TABS: 10.0000 mg | ORAL_TABLET | Freq: Every day | ORAL | Status: DC

## 2014-12-13 MED ORDER — INSULIN ASPART 100 UNIT/ML ~~LOC~~ SOLN
10.0000 [IU] | Freq: Once | SUBCUTANEOUS | Status: AC
  Administered 2014-12-13: 10 [IU] via SUBCUTANEOUS

## 2014-12-13 MED ORDER — GLUCOSE BLOOD VI STRP
ORAL_STRIP | Status: DC
Start: 2014-12-13 — End: 2015-07-17

## 2014-12-13 MED ORDER — INSULIN GLARGINE 100 UNIT/ML ~~LOC~~ SOLN: 36.0000 [IU] | Freq: Every day | SUBCUTANEOUS | Status: DC

## 2014-12-13 MED ORDER — ACCU-CHEK SOFTCLIX LANCET DEV MISC: Status: DC

## 2014-12-13 NOTE — Progress Notes (Signed)
Patient hospitalized 2-3 weeks ago for hyperglycemia, shortness of breath and right arm is "still broken, they are trying to get my hemoglobin up so they can fix it". Patient reports pain in right arm, at level 9, described as sharp pain "to the bone". Patient reports stomach pain at level 5, described as cramping. Last bowel movement 3 days ago. Patient thinks stomach pain is because she needs to have bowel movement. Patient needs new glucometer, can't find machine. Patient has not had insulin this morning and has not had anything to eat. Currently has chewing gum in mouth.

## 2014-12-13 NOTE — Progress Notes (Signed)
Quick Note:  Labs addressed at office as well as medications and patient was made aware. ______ 

## 2014-12-13 NOTE — Patient Instructions (Signed)

## 2014-12-13 NOTE — Progress Notes (Signed)
Subjective:    Patient ID: [name], female    DOB: 1966/01/19, 49 y.o.   MRN: [id number]  HPI  Admit Date:11/22/14 Discharge Date: 11/24/14  [name] had presented to the ED with complaints of acute onset of blurred vision right eye and gait instability. She initially awakened and noticed blurred vision in the right eye and felt that the visual field was narrowing. She went back to bed and when she awakened her vision was worse. She reported difficulty walking.   At time of presentation to the ER the patient's systolic blood pressure was greater than 200 and her CBG was 468.CT of the head was unremarkable. Neurology suggested no further stroke workup and recommended considering ophthalmologically evaluation for unilateral blurred vision.  Labs revealed renal function was also slightly worsened with a BUN of 93 and a creatinine of 6.3 while one week prior creatinine had been 4.98 and a BUN of 60. Hemoglobin was also lower at 7.6. And although her stool was brown and it was Hemoccult positive. Patient also had hyponatremia with a sodium of 127. Her lactic acid was normal at 1.33 and troponin was normal at 0.01. Her chest x-ray did not demonstrate any heart failure.   She was maintained on Lantus and Novolog sliding scale and was agreeable to receiving 1 unit PRBC; she was placed on a PPI, iron supplementation and advised against NSAID use with recommendation for outpatient GI follow up.She refused Dialysis for management of CKD and was advised to follow up with Nephrology. Visual and ambulation symptoms resolved prior to discharge.    Interval History: States her blood pressure is elevated due to the fact that her right arm is broken and they wouldn't perform surgery because she is anemic and refuses a blood transfusion. She took a fall 6 months ago while at nursing facility with  Her CBG is elevated today which she attributes to not taking insulin as she did not eat. Denies visual symptoms  or ambulation difficulties. Was seeing a Teacher, music at [facility] for Schizoaffective disorder whom she does not like and requests a new referral.  Past Medical History  Diagnosis Date  . Neuropathy   . Glaucoma   . Bipolar 1 disorder   . Refusal of blood transfusions as patient is Jehovah's Witness   . Shortness of breath   . TIA (transient ischemic attack) 2010  . Neuromuscular disorder     DIABETIC NEUROPATHY  . MRSA infection ?2014    "on the back of my head; spread throughout my bloodstream"  . Bipolar disorder, unspecified 02/06/2014  . High cholesterol   . Type II diabetes mellitus     INSULIN DEPENDENT  . Hepatitis C   . Chronic pain syndrome   . Chronic kidney disease (CKD), stage III (moderate)   . Schizoaffective disorder, unspecified condition 02/08/2014  . Seizure dx'd 2014    "don't know what kind; last one was ~ 04/2014"  . Chronic combined systolic and diastolic CHF (congestive heart failure)     EF 40% by echo and 48% by stress test  . Anemia   . Hypertension   . Stroke     hx TIA  . Neuromuscular disorder     diabetic neuropathy  . Shortness of breath dyspnea   . Schizophrenia   . Seizures   . Hepatitis     hep C  . Bipolar disorder   . Glaucoma   . Chronic pain   . Diabetes mellitus without complication  on Insulin  . Depression   . Anxiety   . Chronic kidney disease     chronic kidney stage 3 Dr Mercy Moore  . GERD (gastroesophageal reflux disease)   . History of hiatal hernia   . Headache     sinus headaches    Past Surgical History  Procedure Laterality Date  . Amputation Left 05/21/2013    Procedure: Left Midfoot AMPUTATION;  Surgeon: Newt Minion, MD;  Location: [place];  Service: Orthopedics;  Laterality: Left;  Left Midfoot Amputation  . Tubal ligation    . Colonoscopy with propofol N/A 06/22/2013    Procedure: COLONOSCOPY WITH PROPOFOL;  Surgeon: Jeryl Columbia, MD;  Location: [facility];  Service: Endoscopy;  Laterality: N/A;  .  Abdominal hysterectomy  2014  . Tonsillectomy  1970's  . Refractive surgery Bilateral 2013  . Tubal ligation  2000  . Abdominal hysterectomy  2010  . Foot amputation Left     due diabetic neuropathy, could not feel ulcer on bottom of foot  . Eye surgery Bilateral 2010    Lasik    Family History  Problem Relation Age of Onset  . Hypertension Mother   . Hypertension Father    History   Social History  . Marital Status: Unknown    Spouse Name: N/A  . Number of Children: N/A  . Years of Education: N/A   Occupational History  . Not on file.   Social History Main Topics  . Smoking status: Light Tobacco Smoker -- 1.00 packs/day for 20 years    Types: Cigarettes  . Smokeless tobacco: Never Used     Comment: "stopped smoking in ~ 2013; restarted last week; stopped again 06/17/2014"  . Alcohol Use: Yes     Comment: 3 - 40's q week, 12/13/14 patient denies alcohol use.  . Drug Use: Yes    Special: "Crack" cocaine, Cocaine     Comment: last smoked crack 11-07-14  . Sexual Activity: Not Currently    Birth Control/ Protection: Abstinence   Other Topics Concern  . Not on file   Social History Narrative   ** Merged History Encounter **        Allergies  Allergen Reactions  . Gabapentin Itching and Swelling  . Gabapentin Swelling  . Lyrica [Pregabalin] Swelling    Facial and leg swelling  . Penicillins Hives    Pt tolerates cefepime and ceftriaxone  . Saphris [Asenapine] Swelling    Swelling of the face  . Saphris [Asenapine] Swelling    Facial swelling  . Tramadol Swelling    Causes legs to swell  . Tramadol Swelling    Leg swelling  . Penicillins Hives    Has tolerated Cefepime and Ceftriaxone.    Current Outpatient Prescriptions on File Prior to Visit  Medication Sig Dispense Refill  . acetaminophen (TYLENOL) 500 MG tablet Take 500 mg by mouth every 6 (six) hours as needed for mild pain or moderate pain.    Marland Kitchen aspirin EC 325 MG EC tablet Take 1 tablet (325 mg  total) by mouth daily. 30 tablet 0  . carvedilol (COREG) 25 MG tablet Take 1 tablet (25 mg total) by mouth 2 (two) times daily. 60 tablet 0  . ferrous sulfate 325 (65 FE) MG tablet Take 1 tablet (325 mg total) by mouth daily with breakfast. 30 tablet 0  . folic acid (FOLVITE) A999333 MCG tablet Take 200 mcg by mouth every morning.     . furosemide (LASIX) 40 MG tablet  Take 2 tablets (80 mg total) by mouth 2 (two) times daily. 60 tablet 0  . insulin aspart (NOVOLOG) 100 UNIT/ML injection Inject 2-10 Units into the skin 3 (three) times daily before meals. SSI: 0-200 = 2 units, 201-250 = 4 units, 251-300 = 6 units, 301-350 = 8 units, 351-400 = 10 units    . Multiple Vitamin (MULTIVITAMIN WITH MINERALS) TABS tablet Take 1 tablet by mouth daily.    . mupirocin cream (BACTROBAN) 2 % Apply topically daily. (Patient taking differently: Apply 1 application topically daily as needed (itching, skin irritation). ) 15 g 0  . oxyCODONE-acetaminophen (PERCOCET/ROXICET) 5-325 MG per tablet Take 1 tablet by mouth every 6 (six) hours as needed for moderate pain. 30 tablet 0  . pantoprazole (PROTONIX) 40 MG tablet Take 1 tablet (40 mg total) by mouth 2 (two) times daily. 60 tablet 2  . polyethylene glycol (MIRALAX / GLYCOLAX) packet Take 17 g by mouth 2 (two) times daily. Mix in 8 oz liquid and drink  11  . atorvastatin (LIPITOR) 40 MG tablet Take 1 tablet (40 mg total) by mouth daily at 6 PM. (Patient not taking: Reported on 12/13/2014) 30 tablet 0  . Fe Bisgly-Vit C-Vit B12-FA 28-60-0.008-0.4 MG CAPS Take 1 tablet by mouth daily.    . Lactobacillus (ACIDOPHILUS) 100 MG CAPS Take 100 mg by mouth daily.    . metoCLOPramide (REGLAN) 10 MG tablet Take 1 tablet (10 mg total) by mouth every 8 (eight) hours as needed for nausea. (Patient not taking: Reported on 12/13/2014) 30 tablet 0   No current facility-administered medications on file prior to visit.      Review of Systems  Constitutional: Negative for activity change,  appetite change and fatigue.  HENT: Negative for congestion, sinus pressure and sore throat.   Eyes: Negative for visual disturbance.  Respiratory: Negative for cough, chest tightness, shortness of breath and wheezing.   Cardiovascular: Negative for chest pain and palpitations.  Gastrointestinal: Positive for abdominal pain and constipation. Negative for abdominal distention.  Endocrine: Negative for polydipsia.  Genitourinary: Negative for dysuria and frequency.  Musculoskeletal:       See HPI  Skin: Negative for rash.  Neurological: Negative for tremors, light-headedness and numbness.  Hematological: Does not bruise/bleed easily.  Psychiatric/Behavioral: Negative for behavioral problems and agitation.         Objective:  Filed Vitals:   12/13/14 1049  BP: 191/111  Pulse: 75  Temp: 97.8 F (36.6 C)  Resp: 18      Physical Exam  Constitutional: She is oriented to person, place, and time. She appears well-developed and well-nourished. No distress.  HENT:  Head: Normocephalic.  Right Ear: External ear normal.  Left Ear: External ear normal.  Nose: Nose normal.  Mouth/Throat: Oropharynx is clear and moist.  Eyes: Conjunctivae and EOM are normal. Pupils are equal, round, and reactive to light.  Neck: Normal range of motion. No JVD present.  Cardiovascular: Normal rate, regular rhythm, normal heart sounds and intact distal pulses.  Exam reveals no gallop.   No murmur heard. Pulmonary/Chest: Effort normal and breath sounds normal. No respiratory distress. She has no wheezes. She has no rales. She exhibits no tenderness.  Abdominal: Soft. Bowel sounds are normal. She exhibits no distension and no mass. There is no tenderness.  Musculoskeletal:  Right lateral aspect of elbow with bony prominence, evidence of malunion, mildly tender to palpation and reduced ROM. Left arm is normal.   Neurological: She is alert and oriented to  person, place, and time. She has normal reflexes.    Skin: Skin is warm and dry. She is not diaphoretic.  Psychiatric: She has a normal mood and affect.          Assessment & Plan:  49 year old female with a history of non compliance with medications, uncontrolled DM, uncontrolled hypertension, CKD, Schizoaffective disorder here to establish care.  Type 2 DM: Uncontrolled with A!1c of 9.4, CBG of 417 10 units of Novolog given and repeat CBG performed. Advised on compliance and will review blood sugar log at her next office visit. Will refer to ophthalmology at next visit for annual eye exam and given previous visual symptoms.  Hypertension: Uncontrolled, poor medication adherence largely contributory Clonidine 0.1mg  given Increased dose of Amlodipine and refilled Clonidine Reassess BP at next office visit.  CKD As per Nephrology  Constipation; Placed on Lactulose; dietary modification advised.  Anemia: Likely due to chronic disease but in the setting of hemoccult positive stool will refer to GI.  Schizoaffective disorder: Not compliant with medications Referred to Psych  Non union of humerus fracture: Chart review indicates she missed an appointment with Ortho on 11/28/14

## 2014-12-14 ENCOUNTER — Ambulatory Visit: Payer: Self-pay | Admitting: Physician Assistant

## 2014-12-16 ENCOUNTER — Encounter (HOSPITAL_COMMUNITY)
Admission: RE | Admit: 2014-12-16 | Discharge: 2014-12-16 | Disposition: A | Discharge status: Home / Self Care | Payer: Medicare Other | Source: Ambulatory Visit | Attending: Nephrology | Admitting: Nephrology

## 2014-12-16 DIAGNOSIS — Z79899 Other long term (current) drug therapy: Secondary | ICD-10-CM | POA: Diagnosis not present

## 2014-12-16 DIAGNOSIS — D631 Anemia in chronic kidney disease: Secondary | ICD-10-CM | POA: Insufficient documentation

## 2014-12-16 DIAGNOSIS — Z5181 Encounter for therapeutic drug level monitoring: Secondary | ICD-10-CM | POA: Insufficient documentation

## 2014-12-16 DIAGNOSIS — N184 Chronic kidney disease, stage 4 (severe): Secondary | ICD-10-CM | POA: Diagnosis not present

## 2014-12-16 LAB — POCT HEMOGLOBIN-HEMACUE: Hemoglobin: 8.7 g/dL — ABNORMAL LOW (ref 12.0–15.0)

## 2014-12-16 MED ORDER — DARBEPOETIN ALFA 100 MCG/0.5ML IJ SOSY
PREFILLED_SYRINGE | INTRAMUSCULAR | Status: AC
  Filled 2014-12-16: qty 0.5

## 2014-12-16 MED ORDER — DARBEPOETIN ALFA 100 MCG/0.5ML IJ SOSY
100.0000 ug | PREFILLED_SYRINGE | INTRAMUSCULAR | Status: DC
  Administered 2014-12-16: 100 ug via SUBCUTANEOUS

## 2014-12-21 ENCOUNTER — Encounter (HOSPITAL_COMMUNITY)
Admission: RE | Admit: 2014-12-21 | Discharge: 2014-12-21 | Disposition: A | Discharge status: Home / Self Care | Payer: Medicare Other | Source: Ambulatory Visit | Attending: Nephrology | Admitting: Nephrology

## 2014-12-21 DIAGNOSIS — N184 Chronic kidney disease, stage 4 (severe): Secondary | ICD-10-CM | POA: Diagnosis not present

## 2014-12-21 LAB — POCT HEMOGLOBIN-HEMACUE: HEMOGLOBIN: 9.1 g/dL — AB (ref 12.0–15.0)

## 2014-12-21 MED ORDER — DARBEPOETIN ALFA 100 MCG/0.5ML IJ SOSY
PREFILLED_SYRINGE | INTRAMUSCULAR | Status: AC
  Filled 2014-12-21: qty 0.5

## 2014-12-21 MED ORDER — DARBEPOETIN ALFA 100 MCG/0.5ML IJ SOSY
100.0000 ug | PREFILLED_SYRINGE | INTRAMUSCULAR | Status: DC
  Administered 2014-12-21: 100 ug via SUBCUTANEOUS

## 2014-12-23 ENCOUNTER — Encounter: Payer: Self-pay | Admitting: Cardiology

## 2014-12-27 ENCOUNTER — Ambulatory Visit: Payer: Self-pay | Admitting: Family Medicine

## 2014-12-30 ENCOUNTER — Encounter (HOSPITAL_COMMUNITY)
Admission: RE | Admit: 2014-12-30 | Discharge: 2014-12-30 | Disposition: A | Discharge status: Home / Self Care | Payer: Medicare Other | Source: Ambulatory Visit | Attending: Nephrology | Admitting: Nephrology

## 2014-12-30 DIAGNOSIS — N184 Chronic kidney disease, stage 4 (severe): Secondary | ICD-10-CM | POA: Diagnosis not present

## 2014-12-30 LAB — IRON AND TIBC
Iron: 90 ug/dL (ref 28–170)
SATURATION RATIOS: 31 % (ref 10.4–31.8)
TIBC: 291 ug/dL (ref 250–450)
UIBC: 201 ug/dL

## 2014-12-30 LAB — HEMOGLOBIN AND HEMATOCRIT, BLOOD
HEMATOCRIT: 30.4 % — AB (ref 36.0–46.0)
Hemoglobin: 9.9 g/dL — ABNORMAL LOW (ref 12.0–15.0)

## 2014-12-30 LAB — FERRITIN: FERRITIN: 298 ng/mL (ref 11–307)

## 2014-12-30 MED ORDER — DARBEPOETIN ALFA 100 MCG/0.5ML IJ SOSY
100.0000 ug | PREFILLED_SYRINGE | INTRAMUSCULAR | Status: DC
  Administered 2014-12-30: 100 ug via SUBCUTANEOUS

## 2014-12-30 MED ORDER — DARBEPOETIN ALFA 100 MCG/0.5ML IJ SOSY
PREFILLED_SYRINGE | INTRAMUSCULAR | Status: AC
  Filled 2014-12-30: qty 0.5

## 2014-12-30 NOTE — Progress Notes (Signed)
Hemocue machine broken. Crystal at [facility] aware. Orders received. H&H drawn.

## 2015-01-04 ENCOUNTER — Other Ambulatory Visit (HOSPITAL_COMMUNITY): Payer: Self-pay | Admitting: *Deleted

## 2015-01-05 ENCOUNTER — Telehealth: Payer: Self-pay | Admitting: Clinical

## 2015-01-05 ENCOUNTER — Encounter (HOSPITAL_COMMUNITY): Payer: Self-pay

## 2015-01-05 NOTE — Telephone Encounter (Signed)
No message, voicemail full

## 2015-01-13 ENCOUNTER — Ambulatory Visit (HOSPITAL_COMMUNITY)
Admission: RE | Admit: 2015-01-13 | Discharge: 2015-01-13 | Disposition: A | Discharge status: Home / Self Care | Payer: Medicare Other | Source: Ambulatory Visit | Attending: Nephrology | Admitting: Nephrology

## 2015-01-13 ENCOUNTER — Emergency Department (HOSPITAL_COMMUNITY): Payer: Medicare Other

## 2015-01-13 ENCOUNTER — Emergency Department (HOSPITAL_COMMUNITY)
Admission: EM | Admit: 2015-01-13 | Discharge: 2015-01-13 | Disposition: A | Discharge status: Home / Self Care | Payer: Medicare Other | Attending: Emergency Medicine | Admitting: Emergency Medicine

## 2015-01-13 ENCOUNTER — Encounter (HOSPITAL_COMMUNITY): Payer: Self-pay | Admitting: Family Medicine

## 2015-01-13 DIAGNOSIS — E119 Type 2 diabetes mellitus without complications: Secondary | ICD-10-CM | POA: Insufficient documentation

## 2015-01-13 DIAGNOSIS — R700 Elevated erythrocyte sedimentation rate: Secondary | ICD-10-CM | POA: Diagnosis not present

## 2015-01-13 DIAGNOSIS — D500 Iron deficiency anemia secondary to blood loss (chronic): Secondary | ICD-10-CM | POA: Insufficient documentation

## 2015-01-13 DIAGNOSIS — Z79899 Other long term (current) drug therapy: Secondary | ICD-10-CM | POA: Insufficient documentation

## 2015-01-13 DIAGNOSIS — K644 Residual hemorrhoidal skin tags: Secondary | ICD-10-CM | POA: Diagnosis not present

## 2015-01-13 DIAGNOSIS — K922 Gastrointestinal hemorrhage, unspecified: Secondary | ICD-10-CM | POA: Diagnosis not present

## 2015-01-13 DIAGNOSIS — Z794 Long term (current) use of insulin: Secondary | ICD-10-CM | POA: Diagnosis not present

## 2015-01-13 DIAGNOSIS — Z880 Allergy status to penicillin: Secondary | ICD-10-CM | POA: Insufficient documentation

## 2015-01-13 DIAGNOSIS — D591 Other autoimmune hemolytic anemias: Secondary | ICD-10-CM | POA: Diagnosis not present

## 2015-01-13 DIAGNOSIS — Z5181 Encounter for therapeutic drug level monitoring: Secondary | ICD-10-CM | POA: Diagnosis not present

## 2015-01-13 DIAGNOSIS — Z89432 Acquired absence of left foot: Secondary | ICD-10-CM

## 2015-01-13 DIAGNOSIS — Z8673 Personal history of transient ischemic attack (TIA), and cerebral infarction without residual deficits: Secondary | ICD-10-CM | POA: Diagnosis not present

## 2015-01-13 DIAGNOSIS — Z7982 Long term (current) use of aspirin: Secondary | ICD-10-CM | POA: Insufficient documentation

## 2015-01-13 DIAGNOSIS — I5042 Chronic combined systolic (congestive) and diastolic (congestive) heart failure: Secondary | ICD-10-CM | POA: Diagnosis not present

## 2015-01-13 DIAGNOSIS — I10 Essential (primary) hypertension: Secondary | ICD-10-CM

## 2015-01-13 DIAGNOSIS — N184 Chronic kidney disease, stage 4 (severe): Secondary | ICD-10-CM | POA: Insufficient documentation

## 2015-01-13 DIAGNOSIS — D649 Anemia, unspecified: Secondary | ICD-10-CM

## 2015-01-13 DIAGNOSIS — N185 Chronic kidney disease, stage 5: Secondary | ICD-10-CM

## 2015-01-13 DIAGNOSIS — K219 Gastro-esophageal reflux disease without esophagitis: Secondary | ICD-10-CM | POA: Insufficient documentation

## 2015-01-13 DIAGNOSIS — E1165 Type 2 diabetes mellitus with hyperglycemia: Secondary | ICD-10-CM | POA: Insufficient documentation

## 2015-01-13 DIAGNOSIS — G894 Chronic pain syndrome: Secondary | ICD-10-CM | POA: Insufficient documentation

## 2015-01-13 DIAGNOSIS — M79672 Pain in left foot: Secondary | ICD-10-CM | POA: Insufficient documentation

## 2015-01-13 DIAGNOSIS — Z720 Tobacco use: Secondary | ICD-10-CM | POA: Diagnosis not present

## 2015-01-13 DIAGNOSIS — Z8659 Personal history of other mental and behavioral disorders: Secondary | ICD-10-CM | POA: Insufficient documentation

## 2015-01-13 DIAGNOSIS — D631 Anemia in chronic kidney disease: Secondary | ICD-10-CM | POA: Diagnosis not present

## 2015-01-13 DIAGNOSIS — Z8619 Personal history of other infectious and parasitic diseases: Secondary | ICD-10-CM | POA: Insufficient documentation

## 2015-01-13 DIAGNOSIS — I120 Hypertensive chronic kidney disease with stage 5 chronic kidney disease or end stage renal disease: Secondary | ICD-10-CM | POA: Diagnosis not present

## 2015-01-13 LAB — CBC WITH DIFFERENTIAL/PLATELET
BASOS ABS: 0 10*3/uL (ref 0.0–0.1)
BASOS PCT: 1 % (ref 0–1)
EOS ABS: 0.1 10*3/uL (ref 0.0–0.7)
EOS PCT: 3 % (ref 0–5)
HEMATOCRIT: 22.7 % — AB (ref 36.0–46.0)
HEMOGLOBIN: 7.3 g/dL — AB (ref 12.0–15.0)
LYMPHS PCT: 29 % (ref 12–46)
Lymphs Abs: 0.7 10*3/uL (ref 0.7–4.0)
MCH: 28.9 pg (ref 26.0–34.0)
MCHC: 32.2 g/dL (ref 30.0–36.0)
MCV: 89.7 fL (ref 78.0–100.0)
Monocytes Absolute: 0.1 10*3/uL (ref 0.1–1.0)
Monocytes Relative: 6 % (ref 3–12)
Neutro Abs: 1.4 10*3/uL — ABNORMAL LOW (ref 1.7–7.7)
Neutrophils Relative %: 61 % (ref 43–77)
Platelets: 158 10*3/uL (ref 150–400)
RBC: 2.53 MIL/uL — ABNORMAL LOW (ref 3.87–5.11)
RDW: 14.4 % (ref 11.5–15.5)
WBC: 2.3 10*3/uL — ABNORMAL LOW (ref 4.0–10.5)

## 2015-01-13 LAB — URINALYSIS, ROUTINE W REFLEX MICROSCOPIC
BILIRUBIN URINE: NEGATIVE
Glucose, UA: 500 mg/dL — AB
Ketones, ur: NEGATIVE mg/dL
Leukocytes, UA: NEGATIVE
Nitrite: NEGATIVE
PH: 5 (ref 5.0–8.0)
SPECIFIC GRAVITY, URINE: 1.017 (ref 1.005–1.030)
Urobilinogen, UA: 0.2 mg/dL (ref 0.0–1.0)

## 2015-01-13 LAB — BASIC METABOLIC PANEL
ANION GAP: 11 (ref 5–15)
Anion gap: 10 (ref 5–15)
BUN: 60 mg/dL — ABNORMAL HIGH (ref 6–20)
BUN: 58 mg/dL — ABNORMAL HIGH (ref 6–20)
CALCIUM: 7.8 mg/dL — AB (ref 8.9–10.3)
CHLORIDE: 109 mmol/L (ref 101–111)
CO2: 15 mmol/L — AB (ref 22–32)
CO2: 16 mmol/L — ABNORMAL LOW (ref 22–32)
Calcium: 7.8 mg/dL — ABNORMAL LOW (ref 8.9–10.3)
Chloride: 108 mmol/L (ref 101–111)
Creatinine, Ser: 5.58 mg/dL — ABNORMAL HIGH (ref 0.44–1.00)
Creatinine, Ser: 5.59 mg/dL — ABNORMAL HIGH (ref 0.44–1.00)
GFR calc Af Amer: 10 mL/min — ABNORMAL LOW (ref >60)
GFR calc non Af Amer: 8 mL/min — ABNORMAL LOW (ref >60)
GFR calc non Af Amer: 8 mL/min — ABNORMAL LOW (ref >60)
GFR, EST AFRICAN AMERICAN: 9 mL/min — AB (ref >60)
GLUCOSE: 205 mg/dL — AB (ref 65–99)
GLUCOSE: 381 mg/dL — AB (ref 65–99)
POTASSIUM: 4.7 mmol/L (ref 3.5–5.1)
Potassium: 4.5 mmol/L (ref 3.5–5.1)
SODIUM: 135 mmol/L (ref 135–145)
Sodium: 134 mmol/L — ABNORMAL LOW (ref 135–145)

## 2015-01-13 LAB — I-STAT CG4 LACTIC ACID, ED: Lactic Acid, Venous: 0.55 mmol/L (ref 0.5–2.0)

## 2015-01-13 LAB — URINE MICROSCOPIC-ADD ON

## 2015-01-13 LAB — POCT HEMOGLOBIN-HEMACUE: Hemoglobin: 7.2 g/dL — ABNORMAL LOW (ref 12.0–15.0)

## 2015-01-13 LAB — POC OCCULT BLOOD, ED: Fecal Occult Bld: POSITIVE — AB

## 2015-01-13 LAB — SEDIMENTATION RATE: Sed Rate: 31 mm/hr — ABNORMAL HIGH (ref 0–22)

## 2015-01-13 MED ORDER — HYDROCODONE-ACETAMINOPHEN 5-325 MG PO TABS
1.0000 | ORAL_TABLET | Freq: Once | ORAL | Status: AC
  Administered 2015-01-13: 1 via ORAL
  Filled 2015-01-13: qty 1

## 2015-01-13 MED ORDER — SODIUM CHLORIDE 0.9 % IV BOLUS (SEPSIS)
1000.0000 mL | Freq: Once | INTRAVENOUS | Status: AC
  Administered 2015-01-13: 1000 mL via INTRAVENOUS

## 2015-01-13 MED ORDER — OXYCODONE-ACETAMINOPHEN 5-325 MG PO TABS: 1.0000 | ORAL_TABLET | Freq: Four times a day (QID) | ORAL | Status: DC | PRN

## 2015-01-13 MED ORDER — DARBEPOETIN ALFA 100 MCG/0.5ML IJ SOSY
100.0000 ug | PREFILLED_SYRINGE | INTRAMUSCULAR | Status: DC
  Administered 2015-01-13: 100 ug via SUBCUTANEOUS

## 2015-01-13 MED ORDER — DARBEPOETIN ALFA 100 MCG/0.5ML IJ SOSY
PREFILLED_SYRINGE | INTRAMUSCULAR | Status: AC
  Administered 2015-01-13: 100 ug via SUBCUTANEOUS
  Filled 2015-01-13: qty 0.5

## 2015-01-13 NOTE — ED Notes (Signed)
Patient returned from X-ray 

## 2015-01-13 NOTE — ED Notes (Signed)
Patient transported to X-ray 

## 2015-01-13 NOTE — Discharge Instructions (Signed)
Your anemia is likely from gastrointestinal bleeding. You have refused a blood transfusion here, therefore you should continue to take your iron and all home medications. You need to call to make an appointment with the gastroenterologist to be evaluated for your chronic GI bleeding. Stay well hydrated, take your diabetic medications as directed, monitor your blood sugar regularly. You will need to follow up your nephrologist for ongoing management of your kidney disease and anemia, and with your regular doctor for recheck of your foot in 3 days. Keep the wound covered with a bandage and antibiotic ointment. Use tylenol or motrin for pain, or percocet as needed for severe pain but don't drive while taking this. Monitor for signs of infection which include: swelling, redness, heat to the area, and pus drainage, or fevers. Return to the ER for changes or worsening symptoms.    Anemia, Nonspecific Anemia is a condition in which the concentration of red blood cells or hemoglobin in the blood is below normal. Hemoglobin is a substance in red blood cells that carries oxygen to the tissues of the body. Anemia results in not enough oxygen reaching these tissues.  CAUSES  Common causes of anemia include:   Excessive bleeding. Bleeding may be internal or external. This includes excessive bleeding from periods (in women) or from the intestine.   Poor nutrition.   Chronic kidney, thyroid, and liver disease.  Bone marrow disorders that decrease red blood cell production.  Cancer and treatments for cancer.  HIV, AIDS, and their treatments.  Spleen problems that increase red blood cell destruction.  Blood disorders.  Excess destruction of red blood cells due to infection, medicines, and autoimmune disorders. SIGNS AND SYMPTOMS   Minor weakness.   Dizziness.   Headache.  Palpitations.   Shortness of breath, especially with exercise.   Paleness.  Cold  sensitivity.  Indigestion.  Nausea.  Difficulty sleeping.  Difficulty concentrating. Symptoms may occur suddenly or they may develop slowly.  DIAGNOSIS  Additional blood tests are often needed. These help your health care provider determine the best treatment. Your health care provider will check your stool for blood and look for other causes of blood loss.  TREATMENT  Treatment varies depending on the cause of the anemia. Treatment can include:   Supplements of iron, vitamin 123456, or folic acid.   Hormone medicines.   A blood transfusion. This may be needed if blood loss is severe.   Hospitalization. This may be needed if there is significant continual blood loss.   Dietary changes.  Spleen removal. HOME CARE INSTRUCTIONS Keep all follow-up appointments. It often takes many weeks to correct anemia, and having your health care provider check on your condition and your response to treatment is very important. SEEK IMMEDIATE MEDICAL CARE IF:   You develop extreme weakness, shortness of breath, or chest pain.   You become dizzy or have trouble concentrating.  You develop heavy vaginal bleeding.   You develop a rash.   You have bloody or black, tarry stools.   You faint.   You vomit up blood.   You vomit repeatedly.   You have abdominal pain.  You have a fever or persistent symptoms for more than 2-3 days.   You have a fever and your symptoms suddenly get worse.   You are dehydrated.  MAKE SURE YOU:  Understand these instructions.  Will watch your condition.  Will get help right away if you are not doing well or get worse. Document Released: 08/29/2004 Document Revised:  03/24/2013 Document Reviewed: 01/15/2013 [employer] Patient Information 2015 [place], [place]. This information is not intended to replace advice given to you by your health care provider. Make sure you discuss any questions you have with your health care provider.   Chronic Kidney  Disease Chronic kidney disease occurs when the kidneys are damaged over a long period. The kidneys are two organs that lie on either side of the spine between the middle of the back and the front of the abdomen. The kidneys:   Remove wastes and extra water from the blood.   Produce important hormones. These help keep bones strong, regulate blood pressure, and help create red blood cells.   Balance the fluids and chemicals in the blood and tissues. A small amount of kidney damage may not cause problems, but a large amount of damage may make it difficult or impossible for the kidneys to work the way they should. If steps are not taken to slow down the kidney damage or stop it from getting worse, the kidneys may stop working permanently. Most of the time, chronic kidney disease does not go away. However, it can often be controlled, and those with the disease can usually live normal lives. CAUSES  The most common causes of chronic kidney disease are diabetes and high blood pressure (hypertension). Chronic kidney disease may also be caused by:   Diseases that cause the kidneys' filters to become inflamed.   Diseases that affect the immune system.   Genetic diseases.   Medicines that damage the kidneys, such as anti-inflammatory medicines.  Poisoning or exposure to toxic substances.   A reoccurring kidney or urinary infection.   A problem with urine flow. This may be caused by:   Cancer.   Kidney stones.   An enlarged prostate in males. SIGNS AND SYMPTOMS  Because the kidney damage in chronic kidney disease occurs slowly, symptoms develop slowly and may not be obvious until the kidney damage becomes severe. A person may have a kidney disease for years without showing any symptoms. Symptoms can include:   Swelling (edema) of the legs, ankles, or feet.   Tiredness (lethargy).   Nausea or vomiting.   Confusion.   Problems with urination, such as:   Decreased urine  production.   Frequent urination, especially at night.   Frequent accidents in children who are potty trained.   Muscle twitches and cramps.   Shortness of breath.  Weakness.   Persistent itchiness.   Loss of appetite.  Metallic taste in the mouth.  Trouble sleeping.  Slowed development in children.  Short stature in children. DIAGNOSIS  Chronic kidney disease may be detected and diagnosed by tests, including blood, urine, imaging, or kidney biopsy tests.  TREATMENT  Most chronic kidney diseases cannot be cured. Treatment usually involves relieving symptoms and preventing or slowing the progression of the disease. Treatment may include:   A special diet. You may need to avoid alcohol and foods thatare salty and high in potassium.   Medicines. These may:   Lower blood pressure.   Relieve anemia.   Relieve swelling.   Protect the bones. HOME CARE INSTRUCTIONS   Follow your prescribed diet.   Take medicines only as directed by your health care provider. Do not take any new medicines (prescription, over-the-counter, or nutritional supplements) unless approved by your health care provider. Many medicines can worsen your kidney damage or need to have the dose adjusted.   Quit smoking if you smoke. Talk to your health care provider about  a smoking cessation program.   Keep all follow-up visits as directed by your health care provider. SEEK IMMEDIATE MEDICAL CARE IF:  Your symptoms get worse or you develop new symptoms.   You develop symptoms of end-stage kidney disease. These include:   Headaches.   Abnormally dark or light skin.   Numbness in the hands or feet.   Easy bruising.   Frequent hiccups.   Menstruation stops.   You have a fever.   You have decreased urine production.   You havepain or bleeding when urinating. MAKE SURE YOU:  Understand these instructions.  Will watch your condition.  Will get help right away  if you are not doing well or get worse. FOR MORE INFORMATION   American Association of Kidney Patients: BombTimer.gl  National Kidney Foundation: www.kidney.[place]: https://mathis.com/  [facility]: www.lifeoptions.org and www.kidneyschool.org Document Released: 04/30/2008 Document Revised: 12/06/2013 Document Reviewed: 03/20/2012 [facility] Patient Information 2015 [place], [place]. This information is not intended to replace advice given to you by your health care provider. Make sure you discuss any questions you have with your health care provider.  Food Basics for Chronic Kidney Disease When your kidneys are not working well, they cannot remove waste and excess substances from your blood as effectively as they did before. This can lead to a buildup and imbalance of these substances, which can affect how your body functions. This buildup can also make your kidneys work harder, causing even more damage. You may need to eat less of certain foods that can lead to the buildup of these substances in your body. By making the changes to your diet that are recommended by your dietitian or health care provider, you could possibly help prevent further kidney damage and delay or prevent the need for dialysis. The following information can help give you a basic understanding of these substances and how they affect your bodily functions. The information also gives examples of foods that contain the highest amounts of these substances. WHAT DO I NEED TO KNOW ABOUT SUBSTANCES IN MY FOOD THAT I MAY NEED TO ADJUST? Food adjustments will be different for each person with chronic kidney disease. It is important that you see a dietitian who can help you determine the specific adjustments that you will need to make for each of the following substances: Potassium Potassium affects how steadily your heart beats. If too much potassium builds up in your blood, it can cause an  irregular heartbeat or even a heart attack. Examples of foods rich in potassium include:  Milk.  Fruits.  Vegetables. Phosphorus Phosphorus is a mineral found in your bones. A balance between calcium and phosphorous is needed to build and maintain healthy bones. Too much phosphorus pulls calcium from your bones. This can make your bones weak and more likely to break. Too much phosphorus can also make your skin itch. Examples of foods rich in phosphorus include:  Milk and cheese.  Dried beans.  Peas.  Colas.  Nuts and peanut butter. Animal Protein Animal protein helps you make and keep muscle. It also helps in the repair of your body's cells and tissues. One of the natural breakdown products of protein is a waste product called urea. When your kidneys are not working properly, they cannot remove wastes such as urea like they did before you developed chronic kidney disease. You will likely need to limit the amount of protein you eat to help prevent a buildup of urea in your blood. Examples  of animal protein include:  Meat (all types).  Fish and seafood.  Poultry.  Eggs. Sodium Sodium, which is found in salt, helps maintain a healthy balance of fluids in your body. Too much sodium can increase your blood pressure level and have a negative affect on the function of your heart and lungs. Too much sodium also can cause your body to retain too much fluid, making your kidneys work harder. Examples of foods with high levels of sodium include:  Salt seasonings.  Soy sauce.  Cured and processed meats.  Salted crackers and snack foods.  Fast food.  Canned soups and most canned foods. Glucose Glucose provides energy for your body. If you have diabetes mellitus that is not properly controlled, you have too much glucose in your blood. Too much glucose in your blood can worsen the function of your kidneys by damaging small blood vessels. This prevents enough blood flow to your kidneys  to give them what they need to work. If you have diabetes mellitus and chronic kidney disease, it is important to maintain your blood glucose at a level recommended by your health care provider. SHOULD I TAKE A VITAMIN AND MINERAL SUPPLEMENT? Because you may need to avoid eating certain foods, you may not get all of the vitamins and minerals that would normally come from those foods. Your health care provider or dietitian may recommend that you take a supplement to ensure that you get all of the vitamins and minerals that your body needs.  Document Released: 10/12/2002 Document Revised: 12/06/2013 Document Reviewed: 06/18/2013 [facility] Patient Information 2015 [place], [place]. This information is not intended to replace advice given to you by your health care provider. Make sure you discuss any questions you have with your health care provider.  Iron Deficiency Anemia Anemia is when you have a low number of healthy red blood cells. It is often caused by too little iron. This is called iron deficiency anemia. It may make you tired and short of breath. HOME CARE   Take iron as told by your doctor.  Take vitamins as told by your doctor.  Eat foods that have iron in them. This includes liver, lean beef, whole-grain bread, eggs, dried fruit, and dark green leafy vegetables. GET HELP RIGHT AWAY IF:  You pass out (faint).  You have chest pain.  You feel sick to your stomach (nauseous) or throw up (vomit).  You get very short of breath with activity.  You are weak.  You have a fast heartbeat.  You start to sweat for no reason.  You become light-headed when getting up from a chair or bed. MAKE SURE YOU:  Understand these instructions.  Will watch your condition.  Will get help right away if you are not doing well or get worse. Document Released: 08/24/2010 Document Revised: 07/27/2013 Document Reviewed: 03/29/2013 [facility] Patient Information 2015 [place], [place]. This information is  not intended to replace advice given to you by your health care provider. Make sure you discuss any questions you have with your health care provider.  Rectal Bleeding Rectal bleeding is when blood passes out of the anus. It is usually a sign that something is wrong. It may not be serious, but it should always be evaluated. Rectal bleeding may present as bright red blood or extremely dark stools. The color may range from dark red or maroon to black (like tar). It is important that the cause of rectal bleeding be identified so treatment can be started and the problem corrected.  CAUSES   Hemorrhoids. These are enlarged (dilated) blood vessels or veins in the anal or rectal area.  Fistulas. Theseare abnormal, burrowing channels that usually run from inside the rectum to the skin around the anus. They can bleed.  Anal fissures. This is a tear in the tissue of the anus. Bleeding occurs with bowel movements.  Diverticulosis. This is a condition in which pockets or sacs project from the bowel wall. Occasionally, the sacs can bleed.  Diverticulitis. Thisis an infection involving diverticulosis of the colon.  Proctitis and colitis. These are conditions in which the rectum, colon, or both, can become inflamed and pitted (ulcerated).  Polyps and cancer. Polyps are non-cancerous (benign) growths in the colon that may bleed. Certain types of polyps turn into cancer.  Protrusion of the rectum. Part of the rectum can project from the anus and bleed.  Certain medicines.  Intestinal infections.  Blood vessel abnormalities. HOME CARE INSTRUCTIONS  Eat a high-fiber diet to keep your stool soft.  Limit activity.  Drink enough fluids to keep your urine clear or pale yellow.  Warm baths may be useful to soothe rectal pain.  Follow up with your caregiver as directed. SEEK IMMEDIATE MEDICAL CARE IF:  You develop increased bleeding.  You have black or dark red stools.  You vomit blood or  material that looks like coffee grounds.  You have abdominal pain or tenderness.  You have a fever.  You feel weak, nauseous, or you faint.  You have severe rectal pain or you are unable to have a bowel movement. MAKE SURE YOU:  Understand these instructions.  Will watch your condition.  Will get help right away if you are not doing well or get worse. Document Released: 01/11/2002 Document Revised: 10/14/2011 Document Reviewed: 01/06/2011 [facility] Patient Information 2015 [place], [place]. This information is not intended to replace advice given to you by your health care provider. Make sure you discuss any questions you have with your health care provider.  Type 2 Diabetes Mellitus Type 2 diabetes mellitus is a long-term (chronic) disease. In type 2 diabetes:  The pancreas does not make enough of a hormone called insulin.  The cells in the body do not respond as well to the insulin that is made.  Both of the above can happen. Normally, insulin moves sugars from food into tissue cells. This gives you energy. If you have type 2 diabetes, sugars cannot be moved into tissue cells. This causes high blood sugar (hyperglycemia).  HOME CARE  Have your hemoglobin A1c level checked twice a year. The level shows if your diabetes is under control or out of control.  Test your blood sugar level every day as told by your doctor.  Check your ketone levels by testing your pee (urine) when you are sick and as told.  Take your diabetes or insulin medicine as told by your doctor.  Never run out of insulin.  Adjust how much insulin you give yourself based on how many carbs (carbohydrates) you eat. Carbs are in many foods, such as fruits, vegetables, whole grains, and dairy products.  Have a healthy snack between every healthy meal. Have 3 meals and 3 snacks a day.  Lose weight if you are overweight.  Carry a medical alert card or wear your medical alert jewelry.  Carry a 15-gram carb  snack with you at all times. Examples include:  Glucose pills, 3 or 4.  Glucose gel, 15-gram tube.  Raisins, 2 tablespoons (24 grams).  Jelly beans, 6.  Animal crackers, 8.  Regular (not diet) pop, 4 ounces (120 milliliters).  Gummy treats, 9.  Notice low blood sugar (hypoglycemia) symptoms, such as:  Shaking (tremors).  Trouble thinking clearly.  Sweating.  Faster heart rate.  Headache.  Dry mouth.  Hunger.  Crabbiness (irritability).  Being worried or tense (anxious).  Restless sleep.  A change in speech or coordination.  Confusion.  Treat low blood sugar right away. If you are alert and can swallow, follow the 15:15 rule:  Take 15-20 grams of a rapid-acting glucose or carb. This includes glucose gel, glucose pills, or 4 ounces (120 milliliters) of fruit juice, regular pop, or low-fat milk.  Check your blood sugar level 15 minutes after taking the glucose.  Take 15-20 grams more of glucose if the repeat blood sugar level is still 70 mg/dL (milligrams/deciliter) or below.  Eat a meal or snack within 1 hour of the blood sugar levels going back to normal.  Notice early symptoms of high blood sugar, such as:  Being really thirsty or drinking a lot (polydipsia).  Peeing a lot (polyuria).  Do at least 150 minutes of physical activity a week or as told.  Split the 150 minutes of activity up during the week. Do not do 150 minutes of activity in one day.  Perform exercises, such as weight lifting, at least 2 times a week or as told.  Spend no more than 90 minutes at one time inactive.  Adjust your insulin or food intake as needed if you start a new exercise or sport.  Follow your sick-day plan when you are not able to eat or drink as usual.  Do not smoke, chew tobacco, or use electronic cigarettes.  Women who are not pregnant should drink no more than 1 drink a day. Men should drink no more than 2 drinks a day.  Only drink alcohol with food.  Ask  your doctor if alcohol is safe for you.  Tell your doctor if you drink alcohol several times during the week.  See your doctor regularly.  Schedule an eye exam soon after you are told you have diabetes. Schedule exams once every year.  Check your skin and feet every day. Check for cuts, bruises, redness, nail problems, bleeding, blisters, or sores. A doctor should do a foot exam once a year.  Brush your teeth and gums twice a day. Floss once a day. Visit your dentist regularly.  Share your diabetes plan with your workplace or school.  Stay up-to-date with shots that fight against diseases (immunizations).  Learn how to deal with stress.  Get diabetes education and support as needed.  Ask your doctor for special help if:  You need help to maintain or improve how you do things on your own.  You need help to maintain or improve the quality of your life.  You have foot or hand problems.  You have trouble cleaning yourself, dressing, eating, or doing physical activity. GET HELP IF:  You are unable to eat or drink for more than 6 hours.  You feel sick to your stomach (nauseous) or throw up (vomit) for more than 6 hours.  Your blood sugar level is over 240 mg/dL.  There is a change in mental status.  You get another serious illness.  You have watery poop (diarrhea) for more than 6 hours.  You have been sick or have had a fever for 2 or more days and are not getting better.  You have pain when you are  active. GET HELP RIGHT AWAY IF:  You have trouble breathing.  Your ketone levels are higher than your doctor says they should be. MAKE SURE YOU:  Understand these instructions.  Will watch your condition.  Will get help right away if you are not doing well or get worse. Document Released: 04/30/2008 Document Revised: 12/06/2013 Document Reviewed: 02/21/2012 [facility] Patient Information 2015 [place], [place]. This information is not intended to replace advice given to  you by your health care provider. Make sure you discuss any questions you have with your health care provider.

## 2015-01-13 NOTE — ED Notes (Signed)
Pt sts sent here from PCP with low hgb. sts a 7. 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

## 2015-01-13 NOTE — ED Provider Notes (Signed)
CSN: [id number]     Arrival date & time 01/13/15  1152 History   First MD Initiated Contact with Patient 01/13/15 1314     Chief Complaint  Patient presents with  . low hgb   . Foot Injury     (Consider location/radiation/quality/duration/timing/severity/associated sxs/prior Treatment) HPI Comments: [name] is a 49 y.o. female with a PMHx of DM2, chronic pain, chronic anemia, as well a multiple medical conditions as noted below, who presents to the ED sent here by her nephrologist Dr. Mercy Moore for a Hgb level of 7, down from 10 last week per patient. 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 She states that she had a blood transfusion in March, but that after that she has felt like it was a mistake because she has been sick ever since and does not want a blood transfusion today. She endorses symptoms of lightheadedness with standing and dyspnea on exertion related to her anemia. Additionally she states she has had maroon-colored stools. She states that in the past they were supposed to perform a colonoscopy and endoscopy to see if she had any GI bleeding, but she has not had this workup done yet.  Additionally she reports she has had 4 days of left foot pain which she wants evaluated. She states she has had this foot amputated partially at the midfoot, and that 4 days ago she had a black scab that she removed from the top of it, and it has now been draining bloody fluid. The pain is 10/10 constant sharp and achy, nonradiating, worse with walking, and improved with BC powders. She denies any swelling, erythema, warmth, red streaking, or purulent drainage. She denies any fevers, chills, chest pain, shortness of breath, abdominal pain, nausea, vomiting, diarrhea, constipation, hematochezia, dysuria, hematuria, vaginal bleeding or discharge, numbness, tingling, weakness, dizziness, headaches, or vision changes.  Patient is a 49 y.o. female  presenting with foot injury. The history is provided by the patient. No language interpreter was used.  Foot Injury Location:  Foot Time since incident:  4 days Injury: no   Foot location:  L foot Pain details:    Quality:  Aching and sharp   Radiates to:  Does not radiate   Severity:  Moderate   Onset quality:  Gradual   Duration:  4 days   Timing:  Constant   Progression:  Unchanged Chronicity:  New Prior injury to area:  Yes (s/p amputation) Relieved by:  NSAIDs (BC powders) Worsened by:  Bearing weight Ineffective treatments:  None tried Associated symptoms: no decreased ROM, no fever, no muscle weakness, no numbness, no swelling and no tingling     Past Medical History  Diagnosis Date  . Neuropathy   . Glaucoma   . Bipolar 1 disorder   . Refusal of blood transfusions as patient is Jehovah's Witness   . Shortness of breath   . TIA (transient ischemic attack) 2010  . Neuromuscular disorder     DIABETIC NEUROPATHY  . MRSA infection ?2014    "on the back of my head; spread throughout my bloodstream"  . Bipolar disorder, unspecified 02/06/2014  . High cholesterol   . Type II diabetes mellitus     INSULIN DEPENDENT  . Hepatitis C   . Chronic pain syndrome   . Chronic kidney disease (CKD), stage III (moderate)   . Schizoaffective disorder, unspecified condition 02/08/2014  . Seizure dx'd 2014    "don't know what kind;  last one was ~ 04/2014"  . Chronic combined systolic and diastolic CHF (congestive heart failure)     EF 40% by echo and 48% by stress test  . Anemia   . Hypertension   . Stroke     hx TIA  . Neuromuscular disorder     diabetic neuropathy  . Shortness of breath dyspnea   . Schizophrenia   . Seizures   . Hepatitis     hep C  . Bipolar disorder   . Glaucoma   . Chronic pain   . Diabetes mellitus without complication     on Insulin  . Depression   . Anxiety   . Chronic kidney disease     chronic kidney stage 3 Dr Mercy Moore  . GERD  (gastroesophageal reflux disease)   . History of hiatal hernia   . Headache     sinus headaches   Past Surgical History  Procedure Laterality Date  . Amputation Left 05/21/2013    Procedure: Left Midfoot AMPUTATION;  Surgeon: Newt Minion, MD;  Location: [place] [facility];  Service: Orthopedics;  Laterality: Left;  Left Midfoot Amputation  . Tubal ligation    . Colonoscopy with propofol N/A 06/22/2013    Procedure: COLONOSCOPY WITH PROPOFOL;  Surgeon: Jeryl Columbia, MD;  Location: [facility];  Service: Endoscopy;  Laterality: N/A;  . Abdominal hysterectomy  2014  . Tonsillectomy  1970's  . Refractive surgery Bilateral 2013  . Tubal ligation  2000  . Abdominal hysterectomy  2010  . Foot amputation Left     due diabetic neuropathy, could not feel ulcer on bottom of foot  . Eye surgery Bilateral 2010    Lasik   Family History  Problem Relation Age of Onset  . Hypertension Mother   . Hypertension Father    History  Substance Use Topics  . Smoking status: Light Tobacco Smoker -- 1.00 packs/day for 20 years    Types: Cigarettes  . Smokeless tobacco: Never Used     Comment: "stopped smoking in ~ 2013; restarted last week; stopped again 06/17/2014"  . Alcohol Use: Yes     Comment: 3 - 40's q week, 12/13/14 patient denies alcohol use.   OB History    Gravida Para Term Preterm AB TAB SAB Ectopic Multiple Living   0 0 0 0 0 0 0 0       Review of Systems  Constitutional: Negative for fever and chills.  Eyes: Negative for visual disturbance.  Respiratory: Negative for shortness of breath.        +DOE  Cardiovascular: Negative for chest pain and leg swelling.  Gastrointestinal: Positive for blood in stool (maroon). Negative for nausea, vomiting, abdominal pain, diarrhea, constipation and rectal pain.  Genitourinary: Negative for dysuria, hematuria, vaginal bleeding and vaginal discharge.  Musculoskeletal: Positive for arthralgias (L foot). Negative for myalgias and joint swelling.  Skin:  Positive for wound (L foot ulcer). Negative for color change.  Allergic/Immunologic: Positive for immunocompromised state (diabetic).  Neurological: Positive for light-headedness (with standing). Negative for dizziness, weakness, numbness and headaches.  Psychiatric/Behavioral: Negative for confusion.   10 Systems reviewed and are negative for acute change except as noted in the HPI.    Allergies  Gabapentin; Gabapentin; Lyrica; Penicillins; Saphris; Saphris; Tramadol; Tramadol; and Penicillins  Home Medications   Prior to Admission medications   Medication Sig Start Date End Date Taking? Authorizing Provider  acetaminophen (TYLENOL) 500 MG tablet Take 500 mg by mouth every 6 (six) hours as needed  for mild pain or moderate pain.    Historical Provider, MD  amLODipine (NORVASC) 10 MG tablet Take 1 tablet (10 mg total) by mouth daily. 12/13/14   Arnoldo Morale, MD  aspirin EC 325 MG EC tablet Take 1 tablet (325 mg total) by mouth daily. 08/27/14   Geradine Girt, DO  atorvastatin (LIPITOR) 40 MG tablet Take 1 tablet (40 mg total) by mouth daily at 6 PM. Patient not taking: Reported on 12/13/2014 11/11/14   Jonetta Osgood, MD  Blood Glucose Monitoring Suppl (ACCU-CHEK AVIVA PLUS) W/DEVICE KIT 1 each by Does not apply route 4 (four) times daily - after meals and at bedtime. 12/13/14   Arnoldo Morale, MD  carvedilol (COREG) 25 MG tablet Take 1 tablet (25 mg total) by mouth 2 (two) times daily. 06/24/14   Debbe Odea, MD  cloNIDine (CATAPRES) 0.2 MG tablet Take 1 tablet (0.2 mg total) by mouth at bedtime. 12/13/14   Arnoldo Morale, MD  Fe Bisgly-Vit C-Vit B12-FA 28-60-0.008-0.4 MG CAPS Take 1 tablet by mouth daily.    Historical Provider, MD  ferrous sulfate 325 (65 FE) MG tablet Take 1 tablet (325 mg total) by mouth daily with breakfast. 06/24/14   Debbe Odea, MD  folic acid (FOLVITE) 641 MCG tablet Take 200 mcg by mouth every morning.     Historical Provider, MD  furosemide (LASIX) 40 MG tablet Take 2  tablets (80 mg total) by mouth 2 (two) times daily. 08/09/14   Larene Pickett, PA-C  glucose blood (ACCU-CHEK AVIVA PLUS) test strip Use three times before meals and at bedtime 12/13/14   Arnoldo Morale, MD  insulin aspart (NOVOLOG) 100 UNIT/ML injection Inject 2-10 Units into the skin 3 (three) times daily before meals. SSI: 0-200 = 2 units, 201-250 = 4 units, 251-300 = 6 units, 301-350 = 8 units, 351-400 = 10 units    Historical Provider, MD  insulin glargine (LANTUS) 100 UNIT/ML injection Inject 0.36 mLs (36 Units total) into the skin at bedtime. 12/13/14   Arnoldo Morale, MD  Lactobacillus (ACIDOPHILUS) 100 MG CAPS Take 100 mg by mouth daily.    Historical Provider, MD  lactulose (CHRONULAC) 10 GM/15ML solution Take 15 mLs (10 g total) by mouth 3 (three) times daily. 12/13/14   Arnoldo Morale, MD  Lancet Devices (ACCU-CHEK [facility]) lancets Use as instructed 12/13/14   Arnoldo Morale, MD  metoCLOPramide (REGLAN) 10 MG tablet Take 1 tablet (10 mg total) by mouth every 8 (eight) hours as needed for nausea. Patient not taking: Reported on 12/13/2014 11/11/14   Jonetta Osgood, MD  Multiple Vitamin (MULTIVITAMIN WITH MINERALS) TABS tablet Take 1 tablet by mouth daily.    Historical Provider, MD  mupirocin cream (BACTROBAN) 2 % Apply topically daily. Patient taking differently: Apply 1 application topically daily as needed (itching, skin irritation).  08/27/14   Geradine Girt, DO  oxyCODONE-acetaminophen (PERCOCET/ROXICET) 5-325 MG per tablet Take 1 tablet by mouth every 6 (six) hours as needed for moderate pain. 11/24/14   Thurnell Lose, MD  pantoprazole (PROTONIX) 40 MG tablet Take 1 tablet (40 mg total) by mouth 2 (two) times daily. 11/24/14   Thurnell Lose, MD  polyethylene glycol (MIRALAX / GLYCOLAX) packet Take 17 g by mouth 2 (two) times daily. Mix in 8 oz liquid and drink 08/24/14   Historical Provider, MD   BP 165/86 mmHg  Pulse 95  Temp(Src) 97.7 F (36.5 C) (Oral)  Resp 16  Ht $R'5\' 4"'XI$  (1.626 m)  Wt  172 lb (78.019 kg)  BMI 29.51 kg/m2  SpO2 100% Physical Exam  Constitutional: She is oriented to person, place, and time. Vital signs are normal. She appears well-developed and well-nourished.  Non-toxic appearance. No distress.  Afebrile, nontoxic, NAD  HENT:  Head: Normocephalic and atraumatic.  Mouth/Throat: Oropharynx is clear and moist and mucous membranes are normal.  Eyes: Conjunctivae and EOM are normal. Right eye exhibits no discharge. Left eye exhibits no discharge.  Scleral pallor  Neck: Normal range of motion. Neck supple.  Cardiovascular: Normal rate, regular rhythm, normal heart sounds and intact distal pulses.  Exam reveals no gallop and no friction rub.   No murmur heard. Pulmonary/Chest: Effort normal and breath sounds normal. No respiratory distress. She has no decreased breath sounds. She has no wheezes. She has no rhonchi. She has no rales.  CTAB in all lung fields, no w/r/r, no hypoxia or increased WOB, speaking in full sentences, SpO2 100% on RA   Abdominal: Soft. Normal appearance and bowel sounds are normal. She exhibits no distension. There is no tenderness. There is no rigidity, no rebound and no guarding.  Genitourinary: Rectal exam shows external hemorrhoid (old hemorrhoidal skin tags). Rectal exam shows no internal hemorrhoid, no fissure, no mass, no tenderness and anal tone normal. Guaiac positive stool. Pelvic exam was performed with patient in the knee-chest position.  Chaperone present No gross blood noted on rectal exam, normal tone, no tenderness, no mass or fissure, no internal hemorrhoids palpable, old ext hemorrhoidal skin tag present with no TTP or active bleeding. FOBT +. No fecal impaction. Stool somewhat dark  Musculoskeletal: Normal range of motion.       Left foot: There is tenderness, bony tenderness and laceration (ulcer). There is normal range of motion and no swelling.  L foot s/p partial mid-foot amputation with ~1cm ulceration to the distal end,  no swelling or erythema, no warmth, no drainage, mild TTP to this area. Strength and sensation grossly intact, distal pulses intact. SEE PICTURE BELOW  Neurological: She is alert and oriented to person, place, and time. She has normal strength. No sensory deficit.  Skin: Skin is warm and dry. No rash noted.  L foot ulcer with scab as noted above and as seen in picture below  Psychiatric: She has a normal mood and affect.  Nursing note and vitals reviewed.     ED Course  Procedures (including critical care time) Labs Review Labs Reviewed  CBC WITH DIFFERENTIAL/PLATELET - Abnormal; Notable for the following:    WBC 2.3 (*)    RBC 2.53 (*)    Hemoglobin 7.3 (*)    HCT 22.7 (*)    Neutro Abs 1.4 (*)    All other components within normal limits  BASIC METABOLIC PANEL - Abnormal; Notable for the following:    Sodium 134 (*)    CO2 16 (*)    Glucose, Bld 381 (*)    BUN 58 (*)    Creatinine, Ser 5.59 (*)    Calcium 7.8 (*)    GFR calc non Af Amer 8 (*)    GFR calc Af Amer 9 (*)    All other components within normal limits  SEDIMENTATION RATE - Abnormal; Notable for the following:    Sed Rate 31 (*)    All other components within normal limits  URINALYSIS, ROUTINE W REFLEX MICROSCOPIC (NOT AT [facility]) - Abnormal; Notable for the following:    Glucose, UA 500 (*)    Hgb urine dipstick TRACE (*)  Protein, ur >300 (*)    All other components within normal limits  URINE MICROSCOPIC-ADD ON - Abnormal; Notable for the following:    Squamous Epithelial / LPF FEW (*)    Bacteria, UA FEW (*)    Casts GRANULAR CAST (*)    All other components within normal limits  POC OCCULT BLOOD, ED - Abnormal; Notable for the following:    Fecal Occult Bld POSITIVE (*)    All other components within normal limits  I-STAT CG4 LACTIC ACID, ED    Imaging Review Dg Foot Complete Left  01/13/2015   CLINICAL DATA:  Left foot pain. Discharge from the amputation stump.  EXAM: LEFT FOOT - COMPLETE 3+ VIEW   COMPARISON:  04/22/2014  FINDINGS: Again noted is a left transmetatarsal amputation with a small amount of residual metatarsal bone. Subtle areas of soft tissue lucency at the amputation site but no gross abnormality. There is mild irregularity of the residual metatarsal bones but not significantly changed from the previous examination. No new areas of periosteal reaction or cortical destruction. Again noted is a small calcaneal spur.  IMPRESSION: Stable post amputation changes in the left foot. No radiographic findings to suggest osteomyelitis. If there is high clinical concern for osteomyelitis, recommend further characterization with MR.   Electronically Signed   By: Markus Daft M.D.   On: 01/13/2015 14:19     EKG Interpretation None      MDM   Final diagnoses:  Left foot pain  Iron deficiency anemia due to chronic blood loss  Chronic GI bleeding  Symptomatic anemia  Nontraumatic amputation of left foot  CKD (chronic kidney disease) stage 5, GFR less than 15 ml/min  Hyperglycemia due to type 2 diabetes mellitus  Elevated erythrocyte sedimentation rate  Residual hemorrhoidal skin tags  HTN (hypertension), benign    49 y.o. female here for low hgb sent by her nephrologist, and also here for L foot pain. Refuses blood transfusion. Hgb reported to be 7, down from 10 last week. Endorses maroon stool. On exam, scleral pallor and external hemorrhoids. L foot with small ulceration, superficial, without erythema or warmth. Mild tenderness, will obtain xray to eval for underlying osteo. Does not appear infected but pt with multiple medical comorbidities therefore will get labs and xray. Will order pain meds. Will get FOBT which in the past has been positive, likely the cause of her ongoing anemia. Needs GI outpt work up. Will reassess shortly.   4:47 PM FOBT positive, likely the source of her chronic anemia. Chart review reveals last week Hgb 9.1 and today 7.3, likely of GI source. Pt refuses to  have transfusion here. Advised that she f/up with GI outpt for colonoscopy. Continue taking home meds for anemia. CBC w/diff showing no leukocytosis. BMP showing chronic low sodium at 134, chronic hyperglycemia of 381 today, bicarb 16 but no anion gap and U/A without ketones, 1L NS given already therefore will just have her take her regular insulin at home. BUN and Cr chronically elevated, near baseline. Of note, per chart review, she has refused dialysis in the past. ESR 31 which is less than in the past. Foot xray neg for osteomyelitis. The wound on her foot does not appear infected, discussed keeping clean and dry with topical abx ointment but doubt need for PO abx. Pt requesting pain meds for home, states she ran out of percocet. Advised that we don't do chronic pain meds, states she has f/up with chronic pain clinic but it isn't  for "a while". Agreed to give her a one day supply. Discussed ice/heat and elevation. Will have her f/up with PCP in 3 days for wound recheck, and f/up with nephrologist in 3 days for recheck of labs, as well as GI f/up ASAP. I explained the diagnosis and have given explicit precautions to return to the ER including for any other new or worsening symptoms. The patient understands and accepts the medical plan as it's been dictated and I have answered their questions. Discharge instructions concerning home care and prescriptions have been given. The patient is STABLE and is discharged to home in good condition.  BP 158/91 mmHg  Pulse 100  Temp(Src) 97.7 F (36.5 C) (Oral)  Resp 16  Ht $R'5\' 4"'zB$  (1.626 m)  Wt 172 lb (78.019 kg)  BMI 29.51 kg/m2  SpO2 100%  Meds ordered this encounter  Medications  . sodium chloride 0.9 % bolus 1,000 mL    Sig:   . HYDROcodone-acetaminophen (NORCO/VICODIN) 5-325 MG per tablet 1 tablet    Sig:   . oxyCODONE-acetaminophen (PERCOCET) 5-325 MG per tablet    Sig: Take 1 tablet by mouth every 6 (six) hours as needed for severe pain.    Dispense:   6 tablet    Refill:  0    Order Specific Question:  Supervising Provider    Answer:  Noemi Chapel [[id number]]     Keelee Yankey Camprubi-Soms, PA-C 01/13/15 1654  Alfonzo Beers, MD 01/13/15 1655

## 2015-01-20 ENCOUNTER — Ambulatory Visit (HOSPITAL_COMMUNITY)
Admission: RE | Admit: 2015-01-20 | Discharge: 2015-01-20 | Disposition: A | Discharge status: Home / Self Care | Payer: Medicare Other | Source: Ambulatory Visit | Attending: Nephrology | Admitting: Nephrology

## 2015-01-20 DIAGNOSIS — N184 Chronic kidney disease, stage 4 (severe): Secondary | ICD-10-CM | POA: Diagnosis present

## 2015-01-20 DIAGNOSIS — Z5181 Encounter for therapeutic drug level monitoring: Secondary | ICD-10-CM | POA: Insufficient documentation

## 2015-01-20 DIAGNOSIS — Z79899 Other long term (current) drug therapy: Secondary | ICD-10-CM | POA: Insufficient documentation

## 2015-01-20 DIAGNOSIS — D631 Anemia in chronic kidney disease: Secondary | ICD-10-CM | POA: Insufficient documentation

## 2015-01-20 LAB — IRON AND TIBC
Iron: 221 ug/dL — ABNORMAL HIGH (ref 28–170)
Saturation Ratios: 72 % — ABNORMAL HIGH (ref 10.4–31.8)
TIBC: 308 ug/dL (ref 250–450)
UIBC: 87 ug/dL

## 2015-01-20 LAB — FERRITIN: Ferritin: 106 ng/mL (ref 11–307)

## 2015-01-20 LAB — POCT HEMOGLOBIN-HEMACUE: Hemoglobin: 7.4 g/dL — ABNORMAL LOW (ref 12.0–15.0)

## 2015-01-20 MED ORDER — DARBEPOETIN ALFA 150 MCG/0.3ML IJ SOSY
150.0000 ug | PREFILLED_SYRINGE | Freq: Once | INTRAMUSCULAR | Status: AC
Start: 2015-01-20 — End: 2015-01-20
  Administered 2015-01-20: 150 ug via SUBCUTANEOUS

## 2015-01-20 MED ORDER — DARBEPOETIN ALFA 100 MCG/0.5ML IJ SOSY: 100.0000 ug | PREFILLED_SYRINGE | INTRAMUSCULAR | Status: DC

## 2015-01-20 MED ORDER — DARBEPOETIN ALFA 150 MCG/0.3ML IJ SOSY
PREFILLED_SYRINGE | INTRAMUSCULAR | Status: AC
  Filled 2015-01-20: qty 0.3

## 2015-01-20 NOTE — Progress Notes (Signed)
Hemoglobin reported to Crystal at [facility]. Patient states she refuses to take blood products. Crystal called back with orders from Dr. Moshe Cipro. Patient aware.

## 2015-01-24 ENCOUNTER — Encounter: Payer: Self-pay | Admitting: Cardiology

## 2015-01-27 ENCOUNTER — Encounter (HOSPITAL_COMMUNITY)
Admission: RE | Admit: 2015-01-27 | Discharge: 2015-01-27 | Disposition: A | Discharge status: Home / Self Care | Payer: Medicare Other | Source: Ambulatory Visit | Attending: Nephrology | Admitting: Nephrology

## 2015-01-27 DIAGNOSIS — Z79899 Other long term (current) drug therapy: Secondary | ICD-10-CM | POA: Diagnosis not present

## 2015-01-27 DIAGNOSIS — N184 Chronic kidney disease, stage 4 (severe): Secondary | ICD-10-CM | POA: Diagnosis present

## 2015-01-27 DIAGNOSIS — Z5181 Encounter for therapeutic drug level monitoring: Secondary | ICD-10-CM | POA: Diagnosis not present

## 2015-01-27 DIAGNOSIS — D631 Anemia in chronic kidney disease: Secondary | ICD-10-CM | POA: Diagnosis not present

## 2015-01-27 LAB — FERRITIN: Ferritin: 104 ng/mL (ref 11–307)

## 2015-01-27 LAB — IRON AND TIBC
IRON: 205 ug/dL — AB (ref 28–170)
Saturation Ratios: 65 % — ABNORMAL HIGH (ref 10.4–31.8)
TIBC: 315 ug/dL (ref 250–450)
UIBC: 110 ug/dL

## 2015-01-27 LAB — POCT HEMOGLOBIN-HEMACUE: Hemoglobin: 8.1 g/dL — ABNORMAL LOW (ref 12.0–15.0)

## 2015-01-27 MED ORDER — DARBEPOETIN ALFA 150 MCG/0.3ML IJ SOSY
150.0000 ug | PREFILLED_SYRINGE | INTRAMUSCULAR | Status: DC
  Administered 2015-01-27: 150 ug via SUBCUTANEOUS

## 2015-01-27 MED ORDER — DARBEPOETIN ALFA 150 MCG/0.3ML IJ SOSY
PREFILLED_SYRINGE | INTRAMUSCULAR | Status: AC
  Filled 2015-01-27: qty 0.3

## 2015-02-03 ENCOUNTER — Encounter (HOSPITAL_COMMUNITY)
Admission: RE | Admit: 2015-02-03 | Discharge: 2015-02-03 | Disposition: A | Discharge status: Home / Self Care | Payer: Medicare Other | Source: Ambulatory Visit | Attending: Nephrology | Admitting: Nephrology

## 2015-02-03 DIAGNOSIS — N184 Chronic kidney disease, stage 4 (severe): Secondary | ICD-10-CM | POA: Insufficient documentation

## 2015-02-03 DIAGNOSIS — D631 Anemia in chronic kidney disease: Secondary | ICD-10-CM | POA: Diagnosis not present

## 2015-02-03 DIAGNOSIS — Z5181 Encounter for therapeutic drug level monitoring: Secondary | ICD-10-CM | POA: Diagnosis not present

## 2015-02-03 DIAGNOSIS — Z79899 Other long term (current) drug therapy: Secondary | ICD-10-CM | POA: Diagnosis not present

## 2015-02-03 LAB — POCT HEMOGLOBIN-HEMACUE: HEMOGLOBIN: 8.9 g/dL — AB (ref 12.0–15.0)

## 2015-02-03 MED ORDER — DARBEPOETIN ALFA 150 MCG/0.3ML IJ SOSY
PREFILLED_SYRINGE | INTRAMUSCULAR | Status: AC
  Filled 2015-02-03: qty 0.3

## 2015-02-03 MED ORDER — DARBEPOETIN ALFA 150 MCG/0.3ML IJ SOSY
150.0000 ug | PREFILLED_SYRINGE | INTRAMUSCULAR | Status: DC
  Administered 2015-02-03: 150 ug via SUBCUTANEOUS

## 2015-02-10 ENCOUNTER — Encounter (HOSPITAL_COMMUNITY)
Admission: RE | Admit: 2015-02-10 | Discharge: 2015-02-10 | Disposition: A | Discharge status: Home / Self Care | Payer: Medicare Other | Source: Ambulatory Visit | Attending: Nephrology | Admitting: Nephrology

## 2015-02-10 DIAGNOSIS — N184 Chronic kidney disease, stage 4 (severe): Secondary | ICD-10-CM | POA: Diagnosis not present

## 2015-02-10 LAB — POCT HEMOGLOBIN-HEMACUE: Hemoglobin: 9.3 g/dL — ABNORMAL LOW (ref 12.0–15.0)

## 2015-02-10 MED ORDER — DARBEPOETIN ALFA 150 MCG/0.3ML IJ SOSY
150.0000 ug | PREFILLED_SYRINGE | INTRAMUSCULAR | Status: DC
  Administered 2015-02-10: 150 ug via SUBCUTANEOUS

## 2015-02-10 MED ORDER — DARBEPOETIN ALFA 150 MCG/0.3ML IJ SOSY
PREFILLED_SYRINGE | INTRAMUSCULAR | Status: AC
  Filled 2015-02-10: qty 0.3

## 2015-02-17 ENCOUNTER — Encounter (HOSPITAL_COMMUNITY): Payer: Self-pay

## 2015-02-21 ENCOUNTER — Encounter (HOSPITAL_COMMUNITY): Payer: Self-pay

## 2015-02-24 ENCOUNTER — Encounter (HOSPITAL_COMMUNITY): Payer: Self-pay

## 2015-02-24 ENCOUNTER — Encounter (HOSPITAL_COMMUNITY)
Admission: RE | Admit: 2015-02-24 | Discharge: 2015-02-24 | Disposition: A | Discharge status: Home / Self Care | Payer: Medicare Other | Source: Ambulatory Visit | Attending: Nephrology | Admitting: Nephrology

## 2015-02-24 DIAGNOSIS — N184 Chronic kidney disease, stage 4 (severe): Secondary | ICD-10-CM | POA: Diagnosis not present

## 2015-02-24 LAB — IRON AND TIBC
Iron: 230 ug/dL — ABNORMAL HIGH (ref 28–170)
Saturation Ratios: 67 % — ABNORMAL HIGH (ref 10.4–31.8)
TIBC: 343 ug/dL (ref 250–450)
UIBC: 113 ug/dL

## 2015-02-24 LAB — POCT HEMOGLOBIN-HEMACUE: Hemoglobin: 7.5 g/dL — ABNORMAL LOW (ref 12.0–15.0)

## 2015-02-24 LAB — FERRITIN: FERRITIN: 45 ng/mL (ref 11–307)

## 2015-02-24 MED ORDER — DARBEPOETIN ALFA 150 MCG/0.3ML IJ SOSY
150.0000 ug | PREFILLED_SYRINGE | INTRAMUSCULAR | Status: DC
  Administered 2015-02-24: 150 ug via SUBCUTANEOUS

## 2015-02-24 MED ORDER — DARBEPOETIN ALFA 150 MCG/0.3ML IJ SOSY
PREFILLED_SYRINGE | INTRAMUSCULAR | Status: AC
  Filled 2015-02-24: qty 0.3

## 2015-03-03 ENCOUNTER — Encounter (HOSPITAL_COMMUNITY): Payer: Self-pay

## 2015-03-24 ENCOUNTER — Ambulatory Visit (HOSPITAL_COMMUNITY)
Admission: RE | Admit: 2015-03-24 | Discharge: 2015-03-24 | Disposition: A | Discharge status: Home / Self Care | Payer: Medicare Other | Source: Ambulatory Visit | Attending: Nephrology | Admitting: Nephrology

## 2015-03-24 DIAGNOSIS — D631 Anemia in chronic kidney disease: Secondary | ICD-10-CM | POA: Diagnosis not present

## 2015-03-24 DIAGNOSIS — N184 Chronic kidney disease, stage 4 (severe): Secondary | ICD-10-CM | POA: Insufficient documentation

## 2015-03-24 DIAGNOSIS — Z5181 Encounter for therapeutic drug level monitoring: Secondary | ICD-10-CM | POA: Insufficient documentation

## 2015-03-24 DIAGNOSIS — Z79899 Other long term (current) drug therapy: Secondary | ICD-10-CM | POA: Insufficient documentation

## 2015-03-24 LAB — IRON AND TIBC
Iron: 31 ug/dL (ref 28–170)
SATURATION RATIOS: 11 % (ref 10.4–31.8)
TIBC: 293 ug/dL (ref 250–450)
UIBC: 262 ug/dL

## 2015-03-24 LAB — FERRITIN: Ferritin: 88 ng/mL (ref 11–307)

## 2015-03-24 LAB — POCT HEMOGLOBIN-HEMACUE: HEMOGLOBIN: 8 g/dL — AB (ref 12.0–15.0)

## 2015-03-24 MED ORDER — DARBEPOETIN ALFA 150 MCG/0.3ML IJ SOSY
150.0000 ug | PREFILLED_SYRINGE | INTRAMUSCULAR | Status: DC
  Administered 2015-03-24: 150 ug via SUBCUTANEOUS

## 2015-03-24 MED ORDER — DARBEPOETIN ALFA 150 MCG/0.3ML IJ SOSY
PREFILLED_SYRINGE | INTRAMUSCULAR | Status: AC
  Administered 2015-03-24: 150 ug via SUBCUTANEOUS
  Filled 2015-03-24: qty 0.3

## 2015-03-28 NOTE — Progress Notes (Signed)
This encounter was created in error - please disregard.

## 2015-03-29 ENCOUNTER — Encounter: Payer: Self-pay | Admitting: Cardiology

## 2015-03-30 ENCOUNTER — Other Ambulatory Visit (HOSPITAL_COMMUNITY): Payer: Self-pay | Admitting: *Deleted

## 2015-03-31 ENCOUNTER — Encounter (HOSPITAL_COMMUNITY)
Admission: RE | Admit: 2015-03-31 | Discharge: 2015-03-31 | Disposition: A | Discharge status: Home / Self Care | Payer: Medicare Other | Source: Ambulatory Visit | Attending: Nephrology | Admitting: Nephrology

## 2015-03-31 DIAGNOSIS — N184 Chronic kidney disease, stage 4 (severe): Secondary | ICD-10-CM | POA: Diagnosis not present

## 2015-03-31 DIAGNOSIS — D631 Anemia in chronic kidney disease: Secondary | ICD-10-CM | POA: Diagnosis not present

## 2015-03-31 DIAGNOSIS — Z79899 Other long term (current) drug therapy: Secondary | ICD-10-CM | POA: Insufficient documentation

## 2015-03-31 DIAGNOSIS — D509 Iron deficiency anemia, unspecified: Secondary | ICD-10-CM | POA: Insufficient documentation

## 2015-03-31 DIAGNOSIS — Z5181 Encounter for therapeutic drug level monitoring: Secondary | ICD-10-CM | POA: Insufficient documentation

## 2015-03-31 LAB — POCT HEMOGLOBIN-HEMACUE: Hemoglobin: 8.5 g/dL — ABNORMAL LOW (ref 12.0–15.0)

## 2015-03-31 MED ORDER — DARBEPOETIN ALFA 150 MCG/0.3ML IJ SOSY
PREFILLED_SYRINGE | INTRAMUSCULAR | Status: AC
  Filled 2015-03-31: qty 0.3

## 2015-03-31 MED ORDER — DARBEPOETIN ALFA 150 MCG/0.3ML IJ SOSY
150.0000 ug | PREFILLED_SYRINGE | INTRAMUSCULAR | Status: DC
  Administered 2015-03-31: 150 ug via SUBCUTANEOUS

## 2015-03-31 MED ORDER — SODIUM CHLORIDE 0.9 % IV SOLN
510.0000 mg | INTRAVENOUS | Status: DC
  Administered 2015-03-31: 510 mg via INTRAVENOUS
  Filled 2015-03-31: qty 17

## 2015-04-05 ENCOUNTER — Other Ambulatory Visit (HOSPITAL_COMMUNITY): Payer: Self-pay | Admitting: *Deleted

## 2015-04-06 ENCOUNTER — Encounter (HOSPITAL_COMMUNITY)
Admission: RE | Admit: 2015-04-06 | Discharge: 2015-04-06 | Disposition: A | Discharge status: Home / Self Care | Payer: Medicare Other | Source: Ambulatory Visit | Attending: Nephrology | Admitting: Nephrology

## 2015-04-06 DIAGNOSIS — D631 Anemia in chronic kidney disease: Secondary | ICD-10-CM | POA: Insufficient documentation

## 2015-04-06 DIAGNOSIS — N184 Chronic kidney disease, stage 4 (severe): Secondary | ICD-10-CM | POA: Diagnosis present

## 2015-04-06 DIAGNOSIS — Z5181 Encounter for therapeutic drug level monitoring: Secondary | ICD-10-CM | POA: Diagnosis not present

## 2015-04-06 DIAGNOSIS — D509 Iron deficiency anemia, unspecified: Secondary | ICD-10-CM | POA: Insufficient documentation

## 2015-04-06 DIAGNOSIS — Z79899 Other long term (current) drug therapy: Secondary | ICD-10-CM | POA: Diagnosis not present

## 2015-04-06 MED ORDER — SODIUM CHLORIDE 0.9 % IV SOLN
510.0000 mg | INTRAVENOUS | Status: AC
  Administered 2015-04-06: 510 mg via INTRAVENOUS
  Filled 2015-04-06: qty 17

## 2015-04-06 MED ORDER — DARBEPOETIN ALFA 150 MCG/0.3ML IJ SOSY
PREFILLED_SYRINGE | INTRAMUSCULAR | Status: AC
  Administered 2015-04-06: 150 ug via SUBCUTANEOUS
  Filled 2015-04-06: qty 0.3

## 2015-04-06 MED ORDER — DARBEPOETIN ALFA 150 MCG/0.3ML IJ SOSY
150.0000 ug | PREFILLED_SYRINGE | INTRAMUSCULAR | Status: DC
  Administered 2015-04-06: 150 ug via SUBCUTANEOUS

## 2015-04-07 LAB — POCT HEMOGLOBIN-HEMACUE: Hemoglobin: 9 g/dL — ABNORMAL LOW (ref 12.0–15.0)

## 2015-04-14 ENCOUNTER — Inpatient Hospital Stay (HOSPITAL_COMMUNITY): Admission: RE | Admit: 2015-04-14 | Payer: Self-pay | Source: Ambulatory Visit

## 2015-04-20 ENCOUNTER — Encounter (HOSPITAL_COMMUNITY): Payer: Self-pay

## 2015-05-01 ENCOUNTER — Emergency Department (HOSPITAL_COMMUNITY): Payer: Medicare Other

## 2015-05-01 ENCOUNTER — Encounter (HOSPITAL_COMMUNITY): Payer: Self-pay | Admitting: Emergency Medicine

## 2015-05-01 ENCOUNTER — Inpatient Hospital Stay (HOSPITAL_COMMUNITY)
Admission: EM | Admit: 2015-05-01 | Discharge: 2015-05-07 | DRG: 674 | Disposition: A | Discharge status: Home / Self Care | Payer: Medicare Other | Attending: Internal Medicine | Admitting: Internal Medicine

## 2015-05-01 DIAGNOSIS — E1122 Type 2 diabetes mellitus with diabetic chronic kidney disease: Secondary | ICD-10-CM | POA: Diagnosis present

## 2015-05-01 DIAGNOSIS — Z90710 Acquired absence of both cervix and uterus: Secondary | ICD-10-CM | POA: Diagnosis not present

## 2015-05-01 DIAGNOSIS — D72819 Decreased white blood cell count, unspecified: Secondary | ICD-10-CM | POA: Diagnosis present

## 2015-05-01 DIAGNOSIS — E46 Unspecified protein-calorie malnutrition: Secondary | ICD-10-CM | POA: Diagnosis present

## 2015-05-01 DIAGNOSIS — N189 Chronic kidney disease, unspecified: Secondary | ICD-10-CM

## 2015-05-01 DIAGNOSIS — F3181 Bipolar II disorder: Secondary | ICD-10-CM | POA: Diagnosis present

## 2015-05-01 DIAGNOSIS — I132 Hypertensive heart and chronic kidney disease with heart failure and with stage 5 chronic kidney disease, or end stage renal disease: Secondary | ICD-10-CM | POA: Diagnosis present

## 2015-05-01 DIAGNOSIS — F259 Schizoaffective disorder, unspecified: Secondary | ICD-10-CM

## 2015-05-01 DIAGNOSIS — D509 Iron deficiency anemia, unspecified: Secondary | ICD-10-CM | POA: Diagnosis present

## 2015-05-01 DIAGNOSIS — E785 Hyperlipidemia, unspecified: Secondary | ICD-10-CM | POA: Diagnosis present

## 2015-05-01 DIAGNOSIS — E1165 Type 2 diabetes mellitus with hyperglycemia: Secondary | ICD-10-CM | POA: Diagnosis present

## 2015-05-01 DIAGNOSIS — R1011 Right upper quadrant pain: Secondary | ICD-10-CM

## 2015-05-01 DIAGNOSIS — E876 Hypokalemia: Secondary | ICD-10-CM

## 2015-05-01 DIAGNOSIS — D631 Anemia in chronic kidney disease: Secondary | ICD-10-CM | POA: Diagnosis present

## 2015-05-01 DIAGNOSIS — Z91199 Patient's noncompliance with other medical treatment and regimen due to unspecified reason: Secondary | ICD-10-CM

## 2015-05-01 DIAGNOSIS — S42201K Unspecified fracture of upper end of right humerus, subsequent encounter for fracture with nonunion: Secondary | ICD-10-CM

## 2015-05-01 DIAGNOSIS — I5042 Chronic combined systolic (congestive) and diastolic (congestive) heart failure: Secondary | ICD-10-CM | POA: Diagnosis present

## 2015-05-01 DIAGNOSIS — E872 Acidosis, unspecified: Secondary | ICD-10-CM

## 2015-05-01 DIAGNOSIS — N181 Chronic kidney disease, stage 1: Secondary | ICD-10-CM

## 2015-05-01 DIAGNOSIS — N179 Acute kidney failure, unspecified: Principal | ICD-10-CM | POA: Diagnosis present

## 2015-05-01 DIAGNOSIS — Z880 Allergy status to penicillin: Secondary | ICD-10-CM | POA: Diagnosis not present

## 2015-05-01 DIAGNOSIS — N185 Chronic kidney disease, stage 5: Secondary | ICD-10-CM | POA: Diagnosis not present

## 2015-05-01 DIAGNOSIS — I2510 Atherosclerotic heart disease of native coronary artery without angina pectoris: Secondary | ICD-10-CM | POA: Diagnosis present

## 2015-05-01 DIAGNOSIS — E871 Hypo-osmolality and hyponatremia: Secondary | ICD-10-CM | POA: Diagnosis present

## 2015-05-01 DIAGNOSIS — E1140 Type 2 diabetes mellitus with diabetic neuropathy, unspecified: Secondary | ICD-10-CM | POA: Diagnosis present

## 2015-05-01 DIAGNOSIS — D649 Anemia, unspecified: Secondary | ICD-10-CM

## 2015-05-01 DIAGNOSIS — E119 Type 2 diabetes mellitus without complications: Secondary | ICD-10-CM

## 2015-05-01 DIAGNOSIS — Z8673 Personal history of transient ischemic attack (TIA), and cerebral infarction without residual deficits: Secondary | ICD-10-CM | POA: Diagnosis not present

## 2015-05-01 DIAGNOSIS — E860 Dehydration: Secondary | ICD-10-CM | POA: Diagnosis present

## 2015-05-01 DIAGNOSIS — H5460 Unqualified visual loss, one eye, unspecified: Secondary | ICD-10-CM

## 2015-05-01 DIAGNOSIS — N186 End stage renal disease: Secondary | ICD-10-CM | POA: Diagnosis present

## 2015-05-01 DIAGNOSIS — Z888 Allergy status to other drugs, medicaments and biological substances status: Secondary | ICD-10-CM | POA: Diagnosis not present

## 2015-05-01 DIAGNOSIS — E1121 Type 2 diabetes mellitus with diabetic nephropathy: Secondary | ICD-10-CM | POA: Diagnosis present

## 2015-05-01 DIAGNOSIS — K449 Diaphragmatic hernia without obstruction or gangrene: Secondary | ICD-10-CM | POA: Diagnosis present

## 2015-05-01 DIAGNOSIS — Z87891 Personal history of nicotine dependence: Secondary | ICD-10-CM

## 2015-05-01 DIAGNOSIS — G40909 Epilepsy, unspecified, not intractable, without status epilepticus: Secondary | ICD-10-CM | POA: Diagnosis present

## 2015-05-01 DIAGNOSIS — Z89432 Acquired absence of left foot: Secondary | ICD-10-CM

## 2015-05-01 DIAGNOSIS — Z992 Dependence on renal dialysis: Secondary | ICD-10-CM

## 2015-05-01 DIAGNOSIS — K8020 Calculus of gallbladder without cholecystitis without obstruction: Secondary | ICD-10-CM | POA: Diagnosis present

## 2015-05-01 DIAGNOSIS — Z79899 Other long term (current) drug therapy: Secondary | ICD-10-CM

## 2015-05-01 DIAGNOSIS — Z794 Long term (current) use of insulin: Secondary | ICD-10-CM | POA: Diagnosis not present

## 2015-05-01 DIAGNOSIS — F3110 Bipolar disorder, current episode manic without psychotic features, unspecified: Secondary | ICD-10-CM

## 2015-05-01 DIAGNOSIS — I10 Essential (primary) hypertension: Secondary | ICD-10-CM

## 2015-05-01 DIAGNOSIS — B182 Chronic viral hepatitis C: Secondary | ICD-10-CM | POA: Diagnosis present

## 2015-05-01 DIAGNOSIS — I160 Hypertensive urgency: Secondary | ICD-10-CM

## 2015-05-01 DIAGNOSIS — N19 Unspecified kidney failure: Secondary | ICD-10-CM

## 2015-05-01 DIAGNOSIS — K219 Gastro-esophageal reflux disease without esophagitis: Secondary | ICD-10-CM | POA: Diagnosis present

## 2015-05-01 DIAGNOSIS — G894 Chronic pain syndrome: Secondary | ICD-10-CM | POA: Diagnosis present

## 2015-05-01 DIAGNOSIS — Z9119 Patient's noncompliance with other medical treatment and regimen: Secondary | ICD-10-CM

## 2015-05-01 DIAGNOSIS — R109 Unspecified abdominal pain: Secondary | ICD-10-CM

## 2015-05-01 LAB — COMPREHENSIVE METABOLIC PANEL
ALT: 64 U/L — AB (ref 14–54)
AST: 47 U/L — AB (ref 15–41)
Albumin: 2.4 g/dL — ABNORMAL LOW (ref 3.5–5.0)
Alkaline Phosphatase: 97 U/L (ref 38–126)
Anion gap: 10 (ref 5–15)
BILIRUBIN TOTAL: 0.6 mg/dL (ref 0.3–1.2)
BUN: 69 mg/dL — AB (ref 6–20)
CO2: 11 mmol/L — ABNORMAL LOW (ref 22–32)
CREATININE: 8.23 mg/dL — AB (ref 0.44–1.00)
Calcium: 8.3 mg/dL — ABNORMAL LOW (ref 8.9–10.3)
Chloride: 105 mmol/L (ref 101–111)
GFR, EST AFRICAN AMERICAN: 6 mL/min — AB (ref >60)
GFR, EST NON AFRICAN AMERICAN: 5 mL/min — AB (ref >60)
Glucose, Bld: 422 mg/dL — ABNORMAL HIGH (ref 65–99)
Potassium: 3.5 mmol/L (ref 3.5–5.1)
Sodium: 126 mmol/L — ABNORMAL LOW (ref 135–145)
TOTAL PROTEIN: 7.2 g/dL (ref 6.5–8.1)

## 2015-05-01 LAB — BLOOD GAS, VENOUS
ACID-BASE DEFICIT: 15.1 mmol/L — AB (ref 0.0–2.0)
Bicarbonate: 12.3 mEq/L — ABNORMAL LOW (ref 20.0–24.0)
FIO2: 0.21
O2 Saturation: 27.2 %
PATIENT TEMPERATURE: 98.6
TCO2: 12.1 mmol/L (ref 0–100)
pCO2, Ven: 35.1 mmHg — ABNORMAL LOW (ref 45.0–50.0)
pH, Ven: 7.17 — CL (ref 7.250–7.300)
pO2, Ven: 22.8 mmHg — CL (ref 30.0–45.0)

## 2015-05-01 LAB — URINALYSIS, ROUTINE W REFLEX MICROSCOPIC
BILIRUBIN URINE: NEGATIVE
KETONES UR: NEGATIVE mg/dL
Leukocytes, UA: NEGATIVE
Nitrite: NEGATIVE
PH: 5.5 (ref 5.0–8.0)
Specific Gravity, Urine: 1.012 (ref 1.005–1.030)
Urobilinogen, UA: 0.2 mg/dL (ref 0.0–1.0)

## 2015-05-01 LAB — CBC WITH DIFFERENTIAL/PLATELET
BASOS ABS: 0 10*3/uL (ref 0.0–0.1)
Basophils Relative: 1 %
EOS PCT: 1 %
Eosinophils Absolute: 0 10*3/uL (ref 0.0–0.7)
HEMATOCRIT: 34.1 % — AB (ref 36.0–46.0)
Hemoglobin: 11.3 g/dL — ABNORMAL LOW (ref 12.0–15.0)
LYMPHS ABS: 0.5 10*3/uL — AB (ref 0.7–4.0)
LYMPHS PCT: 16 %
MCH: 27.2 pg (ref 26.0–34.0)
MCHC: 33.1 g/dL (ref 30.0–36.0)
MCV: 82 fL (ref 78.0–100.0)
MONO ABS: 0.3 10*3/uL (ref 0.1–1.0)
Monocytes Relative: 10 %
NEUTROS ABS: 2.2 10*3/uL (ref 1.7–7.7)
Neutrophils Relative %: 72 %
PLATELETS: 176 10*3/uL (ref 150–400)
RBC: 4.16 MIL/uL (ref 3.87–5.11)
RDW: 16.2 % — AB (ref 11.5–15.5)
WBC: 3 10*3/uL — ABNORMAL LOW (ref 4.0–10.5)

## 2015-05-01 LAB — BASIC METABOLIC PANEL
ANION GAP: 10 (ref 5–15)
BUN: 65 mg/dL — AB (ref 6–20)
CHLORIDE: 105 mmol/L (ref 101–111)
CO2: 13 mmol/L — ABNORMAL LOW (ref 22–32)
Calcium: 8.4 mg/dL — ABNORMAL LOW (ref 8.9–10.3)
Creatinine, Ser: 7.78 mg/dL — ABNORMAL HIGH (ref 0.44–1.00)
GFR, EST AFRICAN AMERICAN: 6 mL/min — AB (ref >60)
GFR, EST NON AFRICAN AMERICAN: 5 mL/min — AB (ref >60)
Glucose, Bld: 388 mg/dL — ABNORMAL HIGH (ref 65–99)
POTASSIUM: 3.4 mmol/L — AB (ref 3.5–5.1)
SODIUM: 128 mmol/L — AB (ref 135–145)

## 2015-05-01 LAB — CBG MONITORING, ED
GLUCOSE-CAPILLARY: 396 mg/dL — AB (ref 65–99)
GLUCOSE-CAPILLARY: 354 mg/dL — AB (ref 65–99)
GLUCOSE-CAPILLARY: 399 mg/dL — AB (ref 65–99)
Glucose-Capillary: 293 mg/dL — ABNORMAL HIGH (ref 65–99)

## 2015-05-01 LAB — URINE MICROSCOPIC-ADD ON

## 2015-05-01 LAB — I-STAT CG4 LACTIC ACID, ED
LACTIC ACID, VENOUS: 0.42 mmol/L — AB (ref 0.5–2.0)
LACTIC ACID, VENOUS: 0.57 mmol/L (ref 0.5–2.0)

## 2015-05-01 LAB — GLUCOSE, CAPILLARY
GLUCOSE-CAPILLARY: 282 mg/dL — AB (ref 65–99)
GLUCOSE-CAPILLARY: 189 mg/dL — AB (ref 65–99)

## 2015-05-01 LAB — LIPASE, BLOOD: LIPASE: 70 U/L — AB (ref 22–51)

## 2015-05-01 MED ORDER — CETYLPYRIDINIUM CHLORIDE 0.05 % MT LIQD
7.0000 mL | Freq: Two times a day (BID) | OROMUCOSAL | Status: DC
  Administered 2015-05-03 – 2015-05-07 (×8): 7 mL via OROMUCOSAL

## 2015-05-01 MED ORDER — CARVEDILOL 25 MG PO TABS
25.0000 mg | ORAL_TABLET | Freq: Two times a day (BID) | ORAL | Status: DC
  Administered 2015-05-01 – 2015-05-07 (×10): 25 mg via ORAL
  Filled 2015-05-01 (×11): qty 1

## 2015-05-01 MED ORDER — POTASSIUM CHLORIDE 10 MEQ/100ML IV SOLN
10.0000 meq | INTRAVENOUS | Status: DC
  Filled 2015-05-01: qty 100

## 2015-05-01 MED ORDER — ONDANSETRON HCL 4 MG/2ML IJ SOLN
4.0000 mg | Freq: Once | INTRAMUSCULAR | Status: AC
  Administered 2015-05-01: 4 mg via INTRAVENOUS
  Filled 2015-05-01: qty 2

## 2015-05-01 MED ORDER — SODIUM CHLORIDE 0.9 % IV SOLN
INTRAVENOUS | Status: DC
  Administered 2015-05-01: 2.9 [IU]/h via INTRAVENOUS
  Filled 2015-05-01: qty 2.5

## 2015-05-01 MED ORDER — FOLIC ACID 1 MG PO TABS
0.5000 mg | ORAL_TABLET | Freq: Every day | ORAL | Status: DC
  Administered 2015-05-02 – 2015-05-07 (×4): 0.5 mg via ORAL
  Filled 2015-05-01 (×5): qty 1

## 2015-05-01 MED ORDER — MORPHINE SULFATE (PF) 4 MG/ML IV SOLN
4.0000 mg | Freq: Once | INTRAVENOUS | Status: AC
  Administered 2015-05-01: 4 mg via INTRAVENOUS
  Filled 2015-05-01: qty 1

## 2015-05-01 MED ORDER — CHLORHEXIDINE GLUCONATE 0.12 % MT SOLN
15.0000 mL | Freq: Two times a day (BID) | OROMUCOSAL | Status: DC
  Administered 2015-05-01 – 2015-05-07 (×10): 15 mL via OROMUCOSAL
  Filled 2015-05-01 (×10): qty 15

## 2015-05-01 MED ORDER — HYDROMORPHONE HCL 1 MG/ML IJ SOLN
1.0000 mg | INTRAMUSCULAR | Status: DC | PRN
  Administered 2015-05-01 – 2015-05-07 (×32): 1 mg via INTRAVENOUS
  Filled 2015-05-01 (×30): qty 1

## 2015-05-01 MED ORDER — SODIUM CHLORIDE 0.9 % IV BOLUS (SEPSIS)
1000.0000 mL | Freq: Once | INTRAVENOUS | Status: AC
  Administered 2015-05-01: 1000 mL via INTRAVENOUS

## 2015-05-01 MED ORDER — MORPHINE SULFATE (PF) 2 MG/ML IV SOLN: 2.0000 mg | INTRAVENOUS | Status: DC | PRN

## 2015-05-01 MED ORDER — ONDANSETRON HCL 4 MG/2ML IJ SOLN
4.0000 mg | Freq: Four times a day (QID) | INTRAMUSCULAR | Status: DC | PRN
  Administered 2015-05-01 – 2015-05-02 (×2): 4 mg via INTRAVENOUS
  Filled 2015-05-01 (×2): qty 2

## 2015-05-01 MED ORDER — ONDANSETRON HCL 4 MG PO TABS
4.0000 mg | ORAL_TABLET | Freq: Four times a day (QID) | ORAL | Status: DC | PRN
Start: 2015-05-01 — End: 2015-05-07
  Administered 2015-05-05 – 2015-05-06 (×2): 4 mg via ORAL
  Filled 2015-05-01 (×2): qty 1

## 2015-05-01 MED ORDER — FOLIC ACID 400 MCG PO TABS: 200.0000 ug | ORAL_TABLET | Freq: Every morning | ORAL | Status: DC

## 2015-05-01 MED ORDER — INSULIN GLARGINE 100 UNIT/ML ~~LOC~~ SOLN
36.0000 [IU] | Freq: Every day | SUBCUTANEOUS | Status: DC
  Administered 2015-05-01 – 2015-05-02 (×2): 36 [IU] via SUBCUTANEOUS
  Filled 2015-05-01 (×3): qty 0.36

## 2015-05-01 MED ORDER — FUROSEMIDE 80 MG PO TABS: 80.0000 mg | ORAL_TABLET | Freq: Two times a day (BID) | ORAL | Status: DC

## 2015-05-01 MED ORDER — CARVEDILOL 25 MG PO TABS: 25.0000 mg | ORAL_TABLET | Freq: Two times a day (BID) | ORAL | Status: DC

## 2015-05-01 MED ORDER — AMLODIPINE BESYLATE 10 MG PO TABS
10.0000 mg | ORAL_TABLET | Freq: Once | ORAL | Status: AC
  Administered 2015-05-01: 10 mg via ORAL
  Filled 2015-05-01: qty 1

## 2015-05-01 MED ORDER — SODIUM CHLORIDE 0.9 % IV SOLN
INTRAVENOUS | Status: AC
  Administered 2015-05-01: 1000 mL via INTRAVENOUS

## 2015-05-01 MED ORDER — HYDRALAZINE HCL 20 MG/ML IJ SOLN: 5.0000 mg | INTRAMUSCULAR | Status: DC | PRN

## 2015-05-01 MED ORDER — FERROUS SULFATE 325 (65 FE) MG PO TABS
325.0000 mg | ORAL_TABLET | Freq: Every day | ORAL | Status: DC
  Administered 2015-05-02: 325 mg via ORAL
  Filled 2015-05-01: qty 1

## 2015-05-01 MED ORDER — DEXTROSE-NACL 5-0.45 % IV SOLN: INTRAVENOUS | Status: DC

## 2015-05-01 MED ORDER — ENOXAPARIN SODIUM 40 MG/0.4ML ~~LOC~~ SOLN: 40.0000 mg | SUBCUTANEOUS | Status: DC

## 2015-05-01 MED ORDER — HYDRALAZINE HCL 10 MG PO TABS
10.0000 mg | ORAL_TABLET | Freq: Three times a day (TID) | ORAL | Status: DC
  Administered 2015-05-01 – 2015-05-05 (×10): 10 mg via ORAL
  Filled 2015-05-01 (×11): qty 1

## 2015-05-01 MED ORDER — AMLODIPINE BESYLATE 10 MG PO TABS: 10.0000 mg | ORAL_TABLET | Freq: Every day | ORAL | Status: DC

## 2015-05-01 MED ORDER — CLONIDINE HCL 0.1 MG PO TABS
0.1000 mg | ORAL_TABLET | Freq: Three times a day (TID) | ORAL | Status: DC
  Administered 2015-05-01 – 2015-05-04 (×6): 0.1 mg via ORAL
  Filled 2015-05-01 (×7): qty 1

## 2015-05-01 MED ORDER — INFLUENZA VAC SPLIT QUAD 0.5 ML IM SUSY
0.5000 mL | PREFILLED_SYRINGE | INTRAMUSCULAR | Status: AC
  Administered 2015-05-05: 0.5 mL via INTRAMUSCULAR
  Filled 2015-05-01 (×2): qty 0.5

## 2015-05-01 MED ORDER — INSULIN ASPART 100 UNIT/ML ~~LOC~~ SOLN
0.0000 [IU] | Freq: Three times a day (TID) | SUBCUTANEOUS | Status: DC
  Administered 2015-05-02: 1 [IU] via SUBCUTANEOUS

## 2015-05-01 MED ORDER — LEVETIRACETAM 500 MG PO TABS
500.0000 mg | ORAL_TABLET | Freq: Two times a day (BID) | ORAL | Status: DC
  Administered 2015-05-01 – 2015-05-07 (×10): 500 mg via ORAL
  Filled 2015-05-01 (×11): qty 1

## 2015-05-01 MED ORDER — CLONIDINE HCL 0.1 MG PO TABS
0.1000 mg | ORAL_TABLET | Freq: Once | ORAL | Status: AC
  Administered 2015-05-01: 0.1 mg via ORAL
  Filled 2015-05-01: qty 1

## 2015-05-01 MED ORDER — SODIUM CHLORIDE 0.9 % IV SOLN
INTRAVENOUS | Status: DC
  Administered 2015-05-01: 50 mL/h via INTRAVENOUS

## 2015-05-01 NOTE — Consult Note (Signed)
Reason for Consult:   RUQ pain, with eating for 5 days, Nausea and vomiting, with anything PO, nothing to eat or drink since  around 6 PM, last evening. Referring Physician: Dr. Dory Horn  (ED)  [name] is an 49 y.o. female.  HPI:  49 y/o Jehovah Witness who was hospitalized in back in April 2016, with CVA, Acute on chronic renal failure, unilateral vision loss, and anemia.  She is apparently opposed to dialysis and refuses blood.  She has chosen not to do dialysis after seeing her mother on HD.  She reports pain RUQ followed by nausea and vomiting right after she eats anything.  After 5 days of this she came to the ED for evaluation.  She is currently pain free on my exam.  Work up in the ED shows she is afebrile, BP is very elevated.  Na 126, creatinine is 8.23.  The last creatinine on 01/13/15 was 5.59. BUN is 69.  Glucose is 422.  Lipase is also up some at 70.    WBC is 3000, H/H 11.3/34.1 up from 7.3/22.7  01/13/15.  >1000 glucsoe, hemoglobin + protein >300 on  UA.  Gallstones found on RUQ [place]:  Wall thickening, up to 5 mm. No pericholecystic edema..  CBD 4 mm.  We are ask to see.  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 Well healed now   Bipolar 1 disorder\hx of schizophrenia/anxiety/depression   Hx of CVA   Chronic combined systolic and diastolic CHF (congestive heart failure)    EF 40% by echo and 48% by stress test     Chronic pain syndrome   Polysubstance use, cocaine this week, Beer, tobacco   Dyslipidemia    Hx of seizures    Hx of anemia  Hx of MRSA infection ?2014  "on the back of my head; spread throughout my bloodstream"  GERD (gastroesophageal reflux disease)   History  of hiatal hernia   Headache     Past Surgical History  Procedure Laterality Date  Amputation Left 05/21/2013   Procedure: Left Midfoot AMPUTATION;  Surgeon: Newt Minion, MD;  Location: [place];  Service: Orthopedics;  Laterality: Left;  Left Midfoot Amputation  Tubal ligation    Colonoscopy with propofol N/A 06/22/2013   Procedure: COLONOSCOPY WITH PROPOFOL;  Surgeon: Jeryl Columbia, MD;  Location: [facility];  Service: Endoscopy;  Laterality: N/A;  Abdominal hysterectomy  2014  Tonsillectomy  1970's  Refractive surgery  Laser surgery for her vision Bilateral 2013  Tubal ligation  2000  Abdominal hysterectomy  2010  Eye surgery Bilateral 2010   Lasik    Family History  Problem Relation Age of Onset  . Hypertension Mother   . Hypertension Father     Social History:  reports that she quit smoking about 4 weeks ago. Her smoking use included Cigarettes. She has a 20 pack-year smoking history. She has never used smokeless tobacco. She reports that she uses illicit drugs ("Crack" cocaine and Cocaine). She reports that she does not drink alcohol. Tobacco:  27 years < 1PPD  Quit 1 month ago. ETOH:  4-5 40's  per day, quit 1 month ago Drugs:   Crack cocaine, last time about 2 days ago. ON disability/Medicaid   Allergies:  Allergies  Allergen Reactions  Gabapentin Itching and Swelling  Lyrica [Pregabalin] Swelling   Facial and leg swelling   Pt tolerates cefepime and ceftriaxone  Saphris [Asenapine] Swelling   Facial swelling  Tramadol Swelling   Leg swelling  Penicillins Hives   Has tolerated Cefepime and Ceftriaxone.    Prior to Admission medications   Medication Sig Start Date End Date Taking? Authorizing Provider  amLODipine (NORVASC) 10 MG tablet Take 1 tablet (10 mg total) by mouth daily. 12/13/14  Yes Arnoldo Morale, MD  Blood Glucose Monitoring Suppl (ACCU-CHEK AVIVA PLUS) W/DEVICE KIT 1 each by Does not apply route 4 (four) times daily - after meals and at bedtime.  12/13/14  Yes Arnoldo Morale, MD  carvedilol (COREG) 25 MG tablet Take 1 tablet (25 mg total) by mouth 2 (two) times daily. 06/24/14  Yes Debbe Odea, MD  cloNIDine (CATAPRES) 0.1 MG tablet Take 0.1 mg by mouth 3 (three) times daily. 01/25/15  Yes Historical Provider, MD  ferrous sulfate 325 (65 FE) MG tablet Take 1 tablet (325 mg total) by mouth daily with breakfast. 06/24/14  Yes Debbe Odea, MD  folic acid (FOLVITE) 295 MCG tablet Take 200 mcg by mouth every morning.    Yes Historical Provider, MD  furosemide (LASIX) 40 MG tablet Take 2 tablets (80 mg total) by mouth 2 (two) times daily. 08/09/14  Yes Larene Pickett, PA-C  glucose blood (ACCU-CHEK AVIVA PLUS) test strip Use three times before meals and at bedtime 12/13/14  Yes Arnoldo Morale, MD  insulin aspart (NOVOLOG) 100 UNIT/ML injection Inject 2-10 Units into the skin 3 (three) times daily before meals. SSI: 0-200 = 2 units, 201-250 = 4 units, 251-300 = 6 units, 301-350 = 8 units, 351-400 = 10 units   Yes Historical Provider, MD  insulin glargine (LANTUS) 100 UNIT/ML injection Inject 0.36 mLs (36 Units total) into the skin at bedtime. 12/13/14  Yes Arnoldo Morale, MD  lactulose (CHRONULAC) 10 GM/15ML solution Take 15 mLs (10 g total) by mouth 3 (three) times daily. 12/13/14  Yes Arnoldo Morale, MD  Lancet Devices [facility]) lancets Use as instructed 12/13/14  Yes Arnoldo Morale, MD  levETIRAcetam (KEPPRA) 500 MG tablet Take 500 mg by mouth every 12 (twelve) hours. 01/25/15  Yes Historical Provider, MD  Multiple Vitamin (MULTIVITAMIN WITH MINERALS) TABS tablet Take 1 tablet by mouth daily.   Yes Historical Provider, MD  mupirocin cream (BACTROBAN) 2 % Apply topically daily. Patient taking differently: Apply 1 application topically daily as needed (itching, skin irritation).  08/27/14  Yes Geradine Girt, DO  oxyCODONE (OXY IR/ROXICODONE) 5 MG immediate release tablet Take 5 mg by mouth every 6 (six) hours as needed for moderate pain or severe pain.   04/25/15  Yes Historical Provider, MD  pantoprazole (PROTONIX) 40 MG tablet Take 1 tablet (40 mg total) by mouth 2 (two) times daily. 11/24/14  Yes Thurnell Lose, MD  polyethylene glycol (MIRALAX / GLYCOLAX) packet Take 17 g by mouth 2 (two) times daily. Mix in 8 oz liquid and drink 08/24/14  Yes Historical Provider, MD  aspirin EC 325 MG EC tablet Take 1 tablet (325 mg total) by mouth daily. Patient not taking: Reported on 05/01/2015 08/27/14   Geradine Girt, DO  atorvastatin (LIPITOR) 40 MG tablet Take 1 tablet (40 mg total) by mouth daily at 6 PM.  Patient not taking: Reported on 12/13/2014 11/11/14   Jonetta Osgood, MD  cloNIDine (CATAPRES) 0.2 MG tablet Take 1 tablet (0.2 mg total) by mouth at bedtime. Patient not taking: Reported on 05/01/2015 12/13/14   Arnoldo Morale, MD  oxyCODONE-acetaminophen (PERCOCET) 5-325 MG per tablet Take 1 tablet by mouth every 6 (six) hours as needed for severe pain. Patient not taking: Reported on 05/01/2015 01/13/15   Mercedes Camprubi-Soms, PA-C  oxyCODONE-acetaminophen (PERCOCET/ROXICET) 5-325 MG per tablet Take 1 tablet by mouth every 6 (six) hours as needed for moderate pain. Patient not taking: Reported on 05/01/2015 11/24/14   Thurnell Lose, MD     Results for orders placed or performed during the hospital encounter of 05/01/15 (from the past 48 hour(s))  CBC with Differential     Status: Abnormal   Collection Time: 05/01/15 11:12 AM  Result Value Ref Range   WBC 3.0 (L) 4.0 - 10.5 K/uL   RBC 4.16 3.87 - 5.11 MIL/uL   Hemoglobin 11.3 (L) 12.0 - 15.0 g/dL   HCT 34.1 (L) 36.0 - 46.0 %   MCV 82.0 78.0 - 100.0 fL   MCH 27.2 26.0 - 34.0 pg   MCHC 33.1 30.0 - 36.0 g/dL   RDW 16.2 (H) 11.5 - 15.5 %   Platelets 176 150 - 400 K/uL   Neutrophils Relative % 72 %   Neutro Abs 2.2 1.7 - 7.7 K/uL   Lymphocytes Relative 16 %   Lymphs Abs 0.5 (L) 0.7 - 4.0 K/uL   Monocytes Relative 10 %   Monocytes Absolute 0.3 0.1 - 1.0 K/uL   Eosinophils Relative 1 %    Eosinophils Absolute 0.0 0.0 - 0.7 K/uL   Basophils Relative 1 %   Basophils Absolute 0.0 0.0 - 0.1 K/uL  Comprehensive metabolic panel     Status: Abnormal   Collection Time: 05/01/15 11:12 AM  Result Value Ref Range   Sodium 126 (L) 135 - 145 mmol/L   Potassium 3.5 3.5 - 5.1 mmol/L   Chloride 105 101 - 111 mmol/L   CO2 11 (L) 22 - 32 mmol/L   Glucose, Bld 422 (H) 65 - 99 mg/dL   BUN 69 (H) 6 - 20 mg/dL   Creatinine, Ser 8.23 (H) 0.44 - 1.00 mg/dL   Calcium 8.3 (L) 8.9 - 10.3 mg/dL   Total Protein 7.2 6.5 - 8.1 g/dL   Albumin 2.4 (L) 3.5 - 5.0 g/dL   AST 47 (H) 15 - 41 U/L   ALT 64 (H) 14 - 54 U/L   Alkaline Phosphatase 97 38 - 126 U/L   Total Bilirubin 0.6 0.3 - 1.2 mg/dL   GFR calc non Af Amer 5 (L) >60 mL/min   GFR calc Af Amer 6 (L) >60 mL/min    Comment: (NOTE) The eGFR has been calculated using the CKD EPI equation. This calculation has not been validated in all clinical situations. eGFR's persistently <60 mL/min signify possible Chronic Kidney Disease.    Anion gap 10 5 - 15  Lipase, blood     Status: Abnormal   Collection Time: 05/01/15 11:12 AM  Result Value Ref Range   Lipase 70 (H) 22 - 51 U/L  Urinalysis, Routine w reflex microscopic (not at [facility])     Status: Abnormal   Collection Time: 05/01/15 12:52 PM  Result Value Ref Range   Color, Urine YELLOW YELLOW   APPearance CLEAR CLEAR   Specific Gravity, Urine 1.012 1.005 - 1.030   pH 5.5 5.0 -  8.0   Glucose, UA >1000 (A) NEGATIVE mg/dL   Hgb urine dipstick LARGE (A) NEGATIVE   Bilirubin Urine NEGATIVE NEGATIVE   Ketones, ur NEGATIVE NEGATIVE mg/dL   Protein, ur >300 (A) NEGATIVE mg/dL   Urobilinogen, UA 0.2 0.0 - 1.0 mg/dL   Nitrite NEGATIVE NEGATIVE   Leukocytes, UA NEGATIVE NEGATIVE  CBG monitoring, ED     Status: Abnormal   Collection Time: 05/01/15 12:52 PM  Result Value Ref Range   Glucose-Capillary 396 (H) 65 - 99 mg/dL  Urine microscopic-add on     Status: None   Collection Time: 05/01/15 12:52 PM   Result Value Ref Range   Squamous Epithelial / LPF RARE RARE   WBC, UA 0-2 <3 WBC/hpf   RBC / HPF 7-10 <3 RBC/hpf   Bacteria, UA RARE RARE  Blood gas, venous     Status: Abnormal   Collection Time: 05/01/15  1:44 PM  Result Value Ref Range   FIO2 0.21    pH, Ven 7.170 (LL) 7.250 - 7.300    Comment: CRITICAL RESULT CALLED TO, READ BACK BY AND VERIFIED WITH: DR.SLOSHMAN,MD AT 1350 BY SHERRY REEKES,RRT,RCP ON 05/01/2015    pCO2, Ven 35.1 (L) 45.0 - 50.0 mmHg   pO2, Ven 22.8 (LL) 30.0 - 45.0 mmHg    Comment: CRITICAL RESULT CALLED TO, READ BACK BY AND VERIFIED WITH: DR.SLOSHMAN,MD AT 1350 BY SHERRY REEKES, RRT RCP ON 05/01/2015    Bicarbonate 12.3 (L) 20.0 - 24.0 mEq/L   TCO2 12.1 0 - 100 mmol/L   Acid-base deficit 15.1 (H) 0.0 - 2.0 mmol/L   O2 Saturation 27.2 %   Patient temperature 98.6    Collection site VEIN    Drawn by COLLECTED BY LABORATORY    Sample type VEIN   I-Stat CG4 Lactic Acid, ED     Status: Abnormal   Collection Time: 05/01/15  1:48 PM  Result Value Ref Range   Lactic Acid, Venous 0.42 (L) 0.5 - 2.0 mmol/L    US Abdomen Limited Ruq  05/01/2015   CLINICAL DATA:  Right upper quadrant abdominal pain. Nausea and vomiting for 5 days. Hepatitis. Chronic kidney disease.  EXAM: US ABDOMEN LIMITED - RIGHT UPPER QUADRANT  COMPARISON:  Renal ultrasound of 11/09/2014.  FINDINGS: Gallbladder:  Multiple gallstones, measuring up to 9 mm. Wall thickening, up to 5 mm. No pericholecystic edema. Murphy's sign not well evaluated; patient pre-medicated.  Common bile duct:  Diameter: Normal, 4 mm.  Liver:  Mildly heterogeneous hepatic echotexture, nonspecific.  Incidental note is made of renal hyperechogenicity.  IMPRESSION: 1. Gallstones with nonspecific gallbladder wall thickening. Sonographic Percell Miller sign could not the evaluated secondary to patient being pre-medicated. Acute cholecystitis cannot be entirely excluded. CT or nuclear medicine scintigraphy could be informative. 2. Renal  echogenicity, suggesting medical renal disease.   Electronically Signed   By: Abigail Miyamoto M.D.   On: 05/01/2015 12:57    Review of Systems  Constitutional: Positive for chills. Negative for fever, weight loss, malaise/fatigue and diaphoresis.  HENT: Negative.   Eyes: Negative.   Respiratory: Positive for cough (dry cough) and shortness of breath (DOE). Negative for hemoptysis, sputum production and wheezing.   Cardiovascular: Positive for palpitations, leg swelling (some more in left than the right) and PND (occasionally). Negative for chest pain, orthopnea and claudication.  Gastrointestinal: Positive for nausea, vomiting and abdominal pain (Mostly RUQ). Negative for heartburn, diarrhea and blood in stool.  Genitourinary: Positive for frequency.  Musculoskeletal: Positive for back pain and joint  pain.  Skin: Negative.   Neurological: Negative.  Negative for weakness.  Endo/Heme/Allergies: Negative.   Psychiatric/Behavioral: Positive for depression and substance abuse.   Blood pressure 197/81, pulse 79, temperature 98.2 F (36.8 C), temperature source Oral, resp. rate 13, height $RemoveBe'5\' 4"'[id number]$  (1.626 m), weight 79.379 kg (175 lb), SpO2 100 %. Physical Exam  Constitutional: She is oriented to person, place, and time. She appears well-developed and well-nourished. No distress.  She looks much better in person than she does on Paper.  HENT:  Head: Normocephalic and atraumatic.  Nose: Nose normal.  Eyes: Conjunctivae and EOM are normal. Right eye exhibits no discharge. Left eye exhibits no discharge. No scleral icterus.  Neck: Normal range of motion. Neck supple. No JVD present. No tracheal deviation present. No thyromegaly present.  Cardiovascular: Normal rate, regular rhythm, normal heart sounds and intact distal pulses.   No murmur heard. Respiratory: Effort normal and breath sounds normal. No respiratory distress. She has no wheezes. She has no rales. She exhibits no tenderness.  GI: Soft.  Bowel sounds are normal. She exhibits no distension and no mass. There is no tenderness. There is no rebound and no guarding.  No pain currently, but she says pain was in RUQ  Musculoskeletal: She exhibits edema. Tenderness: trace on right, a little more on left, she says this is always swollen some.  Lymphadenopathy:    She has no cervical adenopathy.  Neurological: She is alert and oriented to person, place, and time. No cranial nerve deficit.  Skin: Skin is warm and dry. No rash noted. She is not diaphoretic. No erythema. No pallor.  Psychiatric: She has a normal mood and affect. Her behavior is normal. Judgment and thought content normal.    Assessment/Plan: 1.  RUQ pain, nausea, and vomiting for 5 days  (cholelithiasis on [place]) 2.  Dehydration 3.  Acute on chronic renal failure 4.  AODM ID  Glucose 422 5.  Chronic combined systolic and diastolic CHF (EF 37-10%) 6. Hyponatremia  Na 126 7.  Hypertension 8.  Hepatitis C 9.  Polysubstance abuse 10.  Hx of Bipolar/schizophrenia 11.  Chronic pain syndrome 12. Hx of CVA/Seizures 13.  Hx of anemia (refused transfusion - Jehovah Witness) 14. GERD/Hiatal Hernia  Plan;  There are multiple reasons for her to be sick.   Gallstone is one of them;  with her post prandial pain, nausea and vomiting being very reasonable causes.  Her acute on chronic renal failure, CHF, dehydration, ongoing substance use, and uncontrolled diabetes are the first and foremost issues.  I would rehydrate her, see how she does with hydration.  she complains of frequency so she must be still making a fair amount of urine.  Once she is stable she could have a HIDA scan.  If [facility] we can follow from there. Decision on HD are going to have to be reviewed again.  She could not undergo anesthesia with the current labs. We will follow with you.       JENNINGS,WILLARD 05/01/2015, 2:22 PM

## 2015-05-01 NOTE — ED Notes (Signed)
Bed: WA07 Expected date:  Expected time:  Means of arrival:  Comments: EMS- 49yo F, n/v x several days

## 2015-05-01 NOTE — Progress Notes (Signed)
Stop insulin drip and place on long active insulin per home regimen, add SSI.  [name], MD  [facility] Pager [phone]  If 7PM-7AM, please contact night-coverage [email or web address] Password [email or web address]

## 2015-05-01 NOTE — ED Notes (Signed)
Tried IV start x2, unsuccessful.

## 2015-05-01 NOTE — ED Provider Notes (Signed)
CSN: [id number]     Arrival date & time 05/01/15  0973 History   First MD Initiated Contact with Patient 05/01/15 [id number]     Chief Complaint  Patient presents with  . Nausea  . Emesis     (Consider location/radiation/quality/duration/timing/severity/associated sxs/prior Treatment) Patient is a 50 y.o. female presenting with vomiting. The history is limited by a language barrier.  Emesis Severity:  Moderate Duration:  5 days Timing:  Constant Chronicity:  New Recent urination:  Increased Ineffective treatments:  None tried Associated symptoms: abdominal pain (periumbilical, ruq. tried to take pain medication, but could not keep it down, throwing up all medicine but taking insulin,  "like someone beat me in stomache", constant, pain worsened by eating, ate some chitlins)   Associated symptoms: no cough, no diarrhea (2-3 days ago, now "nothing to poop out"), no fever, no headaches and no sore throat   Risk factors: diabetes, prior abdominal surgery (hysterectomy) and suspect food intake (chitlins)   Risk factors: no sick contacts     Past Medical History  Diagnosis Date  . Neuropathy   . Glaucoma   . Bipolar 1 disorder   . Refusal of blood transfusions as patient is Jehovah's Witness   . Shortness of breath   . TIA (transient ischemic attack) 2010  . Neuromuscular disorder     DIABETIC NEUROPATHY  . MRSA infection ?2014    "on the back of my head; spread throughout my bloodstream"  . Bipolar disorder, unspecified 02/06/2014  . High cholesterol   . Type II diabetes mellitus     INSULIN DEPENDENT  . Hepatitis C   . Chronic pain syndrome   . Chronic kidney disease (CKD), stage III (moderate)   . Schizoaffective disorder, unspecified condition 02/08/2014  . Seizure dx'd 2014    "don't know what kind; last one was ~ 04/2014"  . Chronic combined systolic and diastolic CHF (congestive heart failure)     EF 40% by echo and 48% by stress test  . Anemia   . Hypertension   . Stroke    hx TIA  . Neuromuscular disorder     diabetic neuropathy  . Shortness of breath dyspnea   . Schizophrenia   . Seizures   . Hepatitis     hep C  . Bipolar disorder   . Glaucoma   . Chronic pain   . Diabetes mellitus without complication     on Insulin  . Depression   . Anxiety   . Chronic kidney disease     chronic kidney stage 3 Dr Mercy Moore  . GERD (gastroesophageal reflux disease)   . History of hiatal hernia   . Headache     sinus headaches  . Pancytopenia    Past Surgical History  Procedure Laterality Date  . Amputation Left 05/21/2013    Procedure: Left Midfoot AMPUTATION;  Surgeon: Newt Minion, MD;  Location: [facility];  Service: Orthopedics;  Laterality: Left;  Left Midfoot Amputation  . Tubal ligation    . Colonoscopy with propofol N/A 06/22/2013    Procedure: COLONOSCOPY WITH PROPOFOL;  Surgeon: Jeryl Columbia, MD;  Location: [facility];  Service: Endoscopy;  Laterality: N/A;  . Abdominal hysterectomy  2014  . Tonsillectomy  1970's  . Refractive surgery Bilateral 2013  . Tubal ligation  2000  . Abdominal hysterectomy  2010  . Foot amputation Left     due diabetic neuropathy, could not feel ulcer on bottom of foot  . Eye surgery Bilateral 2010  Lasik   Family History  Problem Relation Age of Onset  . Hypertension Mother   . Hypertension Father    Social History  Substance Use Topics  . Smoking status: Former Smoker -- 1.00 packs/day for 20 years    Types: Cigarettes    Quit date: 03/31/2015  . Smokeless tobacco: Never Used     Comment: "stopped smoking in ~ 2013; restarted last week; stopped again 06/17/2014"  . Alcohol Use: No     Comment: 05/01/15 patient denies alcohol use.   OB History    Gravida Para Term Preterm AB TAB SAB Ectopic Multiple Living   0 0 0 0 0 0 0 0       Review of Systems  Constitutional: Negative for fever.  HENT: Negative for sore throat.   Eyes: Negative for visual disturbance.  Respiratory: Negative for cough and  shortness of breath.   Cardiovascular: Negative for chest pain.  Gastrointestinal: Positive for vomiting and abdominal pain (periumbilical, ruq. tried to take pain medication, but could not keep it down, throwing up all medicine but taking insulin,  "like someone beat me in stomache", constant, pain worsened by eating, ate some chitlins). Negative for diarrhea (2-3 days ago, now "nothing to poop out") and constipation.  Genitourinary: Negative for difficulty urinating.  Musculoskeletal: Negative for back pain and neck pain.  Skin: Negative for rash.  Neurological: Negative for syncope and headaches.      Allergies  Gabapentin; Gabapentin; Lyrica; Other; Penicillins; Saphris; Saphris; Tramadol; Tramadol; and Penicillins  Home Medications   Prior to Admission medications   Medication Sig Start Date End Date Taking? Authorizing Provider  amLODipine (NORVASC) 10 MG tablet Take 1 tablet (10 mg total) by mouth daily. 12/13/14  Yes Jaclyn Shaggy, MD  Blood Glucose Monitoring Suppl (ACCU-CHEK AVIVA PLUS) W/DEVICE KIT 1 each by Does not apply route 4 (four) times daily - after meals and at bedtime. 12/13/14  Yes Jaclyn Shaggy, MD  carvedilol (COREG) 25 MG tablet Take 1 tablet (25 mg total) by mouth 2 (two) times daily. 06/24/14  Yes Calvert Cantor, MD  cloNIDine (CATAPRES) 0.1 MG tablet Take 0.1 mg by mouth 3 (three) times daily. 01/25/15  Yes Historical Provider, MD  ferrous sulfate 325 (65 FE) MG tablet Take 1 tablet (325 mg total) by mouth daily with breakfast. 06/24/14  Yes Calvert Cantor, MD  folic acid (FOLVITE) 400 MCG tablet Take 200 mcg by mouth every morning.    Yes Historical Provider, MD  furosemide (LASIX) 40 MG tablet Take 2 tablets (80 mg total) by mouth 2 (two) times daily. 08/09/14  Yes Garlon Hatchet, PA-C  glucose blood (ACCU-CHEK AVIVA PLUS) test strip Use three times before meals and at bedtime 12/13/14  Yes Jaclyn Shaggy, MD  insulin aspart (NOVOLOG) 100 UNIT/ML injection Inject 2-10 Units  into the skin 3 (three) times daily before meals. SSI: 0-200 = 2 units, 201-250 = 4 units, 251-300 = 6 units, 301-350 = 8 units, 351-400 = 10 units   Yes Historical Provider, MD  insulin glargine (LANTUS) 100 UNIT/ML injection Inject 0.36 mLs (36 Units total) into the skin at bedtime. 12/13/14  Yes Jaclyn Shaggy, MD  lactulose (CHRONULAC) 10 GM/15ML solution Take 15 mLs (10 g total) by mouth 3 (three) times daily. 12/13/14  Yes Jaclyn Shaggy, MD  Lancet Devices [facility]) lancets Use as instructed 12/13/14  Yes Jaclyn Shaggy, MD  levETIRAcetam (KEPPRA) 500 MG tablet Take 500 mg by mouth every 12 (twelve) hours. 01/25/15  Yes Historical Provider, MD  Multiple Vitamin (MULTIVITAMIN WITH MINERALS) TABS tablet Take 1 tablet by mouth daily.   Yes Historical Provider, MD  mupirocin cream (BACTROBAN) 2 % Apply topically daily. Patient taking differently: Apply 1 application topically daily as needed (itching, skin irritation).  08/27/14  Yes Geradine Girt, DO  oxyCODONE (OXY IR/ROXICODONE) 5 MG immediate release tablet Take 5 mg by mouth every 6 (six) hours as needed for moderate pain or severe pain.  04/25/15  Yes Historical Provider, MD  pantoprazole (PROTONIX) 40 MG tablet Take 1 tablet (40 mg total) by mouth 2 (two) times daily. 11/24/14  Yes Thurnell Lose, MD  polyethylene glycol (MIRALAX / GLYCOLAX) packet Take 17 g by mouth 2 (two) times daily. Mix in 8 oz liquid and drink 08/24/14  Yes Historical Provider, MD  aspirin EC 325 MG EC tablet Take 1 tablet (325 mg total) by mouth daily. Patient not taking: Reported on 05/01/2015 08/27/14   Geradine Girt, DO  atorvastatin (LIPITOR) 40 MG tablet Take 1 tablet (40 mg total) by mouth daily at 6 PM. Patient not taking: Reported on 12/13/2014 11/11/14   Jonetta Osgood, MD  cloNIDine (CATAPRES) 0.2 MG tablet Take 1 tablet (0.2 mg total) by mouth at bedtime. Patient not taking: Reported on 05/01/2015 12/13/14   Arnoldo Morale, MD  oxyCODONE-acetaminophen  (PERCOCET) 5-325 MG per tablet Take 1 tablet by mouth every 6 (six) hours as needed for severe pain. Patient not taking: Reported on 05/01/2015 01/13/15   Mercedes Camprubi-Soms, PA-C  oxyCODONE-acetaminophen (PERCOCET/ROXICET) 5-325 MG per tablet Take 1 tablet by mouth every 6 (six) hours as needed for moderate pain. Patient not taking: Reported on 05/01/2015 11/24/14   Thurnell Lose, MD   BP 189/103 mmHg  Pulse 90  Temp(Src) 97.7 F (36.5 C) (Oral)  Resp 18  Ht $R'5\' 4"'kx$  (1.626 m)  Wt 161 lb 9.6 oz (73.3 kg)  BMI 27.72 kg/m2  SpO2 100% Physical Exam  Constitutional: She is oriented to person, place, and time. She appears well-developed and well-nourished. No distress.  HENT:  Head: Normocephalic and atraumatic.  Eyes: Conjunctivae and EOM are normal.  Neck: Normal range of motion.  Cardiovascular: Normal rate, regular rhythm, normal heart sounds and intact distal pulses.  Exam reveals no gallop and no friction rub.   No murmur heard. Pulmonary/Chest: Effort normal and breath sounds normal. No respiratory distress. She has no wheezes. She has no rales.  Abdominal: Soft. She exhibits no distension. There is tenderness in the right upper quadrant. There is no guarding, no CVA tenderness and negative Murphy's sign.  Musculoskeletal: She exhibits no edema or tenderness.  Neurological: She is alert and oriented to person, place, and time.  Skin: Skin is warm and dry. No rash noted. She is not diaphoretic. No erythema.  Nursing note and vitals reviewed.   ED Course  Procedures (including critical care time) Labs Review Labs Reviewed  CBC WITH DIFFERENTIAL/PLATELET - Abnormal; Notable for the following:    WBC 3.0 (*)    Hemoglobin 11.3 (*)    HCT 34.1 (*)    RDW 16.2 (*)    Lymphs Abs 0.5 (*)    All other components within normal limits  COMPREHENSIVE METABOLIC PANEL - Abnormal; Notable for the following:    Sodium 126 (*)    CO2 11 (*)    Glucose, Bld 422 (*)    BUN 69 (*)     Creatinine, Ser 8.23 (*)    Calcium 8.3 (*)  Albumin 2.4 (*)    AST 47 (*)    ALT 64 (*)    GFR calc non Af Amer 5 (*)    GFR calc Af Amer 6 (*)    All other components within normal limits  LIPASE, BLOOD - Abnormal; Notable for the following:    Lipase 70 (*)    All other components within normal limits  URINALYSIS, ROUTINE W REFLEX MICROSCOPIC (NOT AT [facility]) - Abnormal; Notable for the following:    Glucose, UA >1000 (*)    Hgb urine dipstick LARGE (*)    Protein, ur >300 (*)    All other components within normal limits  BLOOD GAS, VENOUS - Abnormal; Notable for the following:    pH, Ven 7.170 (*)    pCO2, Ven 35.1 (*)    pO2, Ven 22.8 (*)    Bicarbonate 12.3 (*)    Acid-base deficit 15.1 (*)    All other components within normal limits  BASIC METABOLIC PANEL - Abnormal; Notable for the following:    Sodium 128 (*)    Potassium 3.4 (*)    CO2 13 (*)    Glucose, Bld 388 (*)    BUN 65 (*)    Creatinine, Ser 7.78 (*)    Calcium 8.4 (*)    GFR calc non Af Amer 5 (*)    GFR calc Af Amer 6 (*)    All other components within normal limits  GLUCOSE, CAPILLARY - Abnormal; Notable for the following:    Glucose-Capillary 282 (*)    All other components within normal limits  GLUCOSE, CAPILLARY - Abnormal; Notable for the following:    Glucose-Capillary 189 (*)    All other components within normal limits  I-STAT CG4 LACTIC ACID, ED - Abnormal; Notable for the following:    Lactic Acid, Venous 0.42 (*)    All other components within normal limits  CBG MONITORING, ED - Abnormal; Notable for the following:    Glucose-Capillary 396 (*)    All other components within normal limits  CBG MONITORING, ED - Abnormal; Notable for the following:    Glucose-Capillary 354 (*)    All other components within normal limits  CBG MONITORING, ED - Abnormal; Notable for the following:    Glucose-Capillary 399 (*)    All other components within normal limits  CBG MONITORING, ED - Abnormal;  Notable for the following:    Glucose-Capillary 293 (*)    All other components within normal limits  MRSA PCR SCREENING  MRSA PCR SCREENING  URINE MICROSCOPIC-ADD ON  CBC  BASIC METABOLIC PANEL  BASIC METABOLIC PANEL  BASIC METABOLIC PANEL  COMPREHENSIVE METABOLIC PANEL  I-STAT CG4 LACTIC ACID, ED    Imaging Review US Abdomen Limited Ruq  05/01/2015   CLINICAL DATA:  Right upper quadrant abdominal pain. Nausea and vomiting for 5 days. Hepatitis. Chronic kidney disease.  EXAM: US ABDOMEN LIMITED - RIGHT UPPER QUADRANT  COMPARISON:  Renal ultrasound of 11/09/2014.  FINDINGS: Gallbladder:  Multiple gallstones, measuring up to 9 mm. Wall thickening, up to 5 mm. No pericholecystic edema. Murphy's sign not well evaluated; patient pre-medicated.  Common bile duct:  Diameter: Normal, 4 mm.  Liver:  Mildly heterogeneous hepatic echotexture, nonspecific.  Incidental note is made of renal hyperechogenicity.  IMPRESSION: 1. Gallstones with nonspecific gallbladder wall thickening. Sonographic Percell Miller sign could not the evaluated secondary to patient being pre-medicated. Acute cholecystitis cannot be entirely excluded. CT or nuclear medicine scintigraphy could be informative. 2. Renal echogenicity, suggesting  medical renal disease.   Electronically Signed   By: Abigail Miyamoto M.D.   On: 05/01/2015 12:57   I have personally reviewed and evaluated these images and lab results as part of my medical decision-making.   EKG Interpretation None      MDM   Final diagnoses:  Abdominal pain  Metabolic acidosis  Chronic renal failure, stage 5  Right upper quadrant pain   49yo female with history of chronic kidney disease stage V (previously been declining dialysis), bipolar disorder, CHF, DM, hypertension, seizure disorder who presents with concern for 5 days of epigastric and right upper quadrant pain, nausea, and vomiting with inability to keep down home medications.  Labs show worsening of CKD, with  increasing Cr. Pt with nonanionn gap metabolic acidosis. RUQ US shows gallstones with wall thickening concerning for possible acute cholecystitis.  General Surgery consulted and will follow pt along, however in setting of worsening renal failure and metabolic acidosis with pH of 7.1 will be admitted to medicine.  Nephrology consulted, and pt now states she is interested in a trial of dialysis.  Pt to be admitted to [facility] for further care.     Gareth Morgan, MD 05/01/15 [phone]

## 2015-05-01 NOTE — ED Notes (Signed)
Unable to collect labs at this time MD in room with patient.

## 2015-05-01 NOTE — H&P (Addendum)
[facility] History and Physical  [name] DPO:[id number] DOB: 1966/03/03 DOA: 05/01/2015  Referring physician: ED physician, Dr. Idolina Primer  PCP: [name], MD   Chief Complaint: RUQ abd pain   HPI:  Patient is 49 year old female with multiple medical conditions including chronic kidney disease stage V with associated anemia of chronic disease and iron deficiency, uncontrolled diabetes mellitus, hypertension, bipolar disorder and schizoaffective disorder, hepatitis C, has refused dialysis in the past. Now presented to [facility] emergency department with main concern of several days duration of progressively worsening right upper quadrant abdominal pain, constant and throbbing, 7 out of 10 in severity when present, radiating to entire abdominal area, worse with eating and with no specific alleviating factors. Patient denies similar events in the past.  No fevers or chills.   In emergency department, blood pressure noted to be 24/106, blood work notable for WBC 3.0, Hg 11.3, sodium 126, bicarbonate 11, creatinine 8.23, glucose 422. Imaging studies notable for gallstones and nonspecific gallbladder wall thickening. Nephrology and surgery team consulted. Admit to step down unit for further evaluation and management.  Assessment and Plan: Principal Problem:   RUQ abdominal pain - non specific findings on abd [place] - per surgery team, plan for HIDA scan and provide conservative and supportive care with IVF and analgesia for now - no indication for surgical intervention at this time   Active Problems:   Acute on CKD (chronic kidney disease) stage 5, GFR less than 15 ml/min - IVF have been provided and Cr is trending down - will hold lasix today and continue IVF - reassess clinical status in Am and resume lasix as indicated - nephrology team consulted     Diabetes mellitus with nephropathy - stop insulin drip that was started in ED - place on home Lantus regimen and add SSI     Hypertensive urgency - continue Norvasc that pt takes at home, Clonidine, Coreg - add hydralazine scheduled and as needed     Acute hyponatremia - holding lasix for now and Na is already up from 126 --> 128 - reassess in AM with BMP    Chronic combined systolic and diastolic CHF (congestive heart failure) - close monitoring ov volume status - may be able to resume Lasix in AM    Metabolic acidosis - improving with IVF - BMP in AM    Hypokalemia - supplement and repeat BMP in AM    Leukopenia - chronic and at baseline  - CBC in AM    Hep C w/o coma, chronic    Bipolar disorder - stable mental status     Anemia of chronic kidney failure - No signs of active bleeding, Hg stable - CBC in AM    DVT prophylaxis - SCD's  Radiological Exams on Admission: US Abdomen Limited Ruq  05/01/2015   CLINICAL DATA:  Right upper quadrant abdominal pain. Nausea and vomiting for 5 days. Hepatitis. Chronic kidney disease.  EXAM: US ABDOMEN LIMITED - RIGHT UPPER QUADRANT  COMPARISON:  Renal ultrasound of 11/09/2014.  FINDINGS: Gallbladder:  Multiple gallstones, measuring up to 9 mm. Wall thickening, up to 5 mm. No pericholecystic edema. Murphy's sign not well evaluated; patient pre-medicated.  Common bile duct:  Diameter: Normal, 4 mm.  Liver:  Mildly heterogeneous hepatic echotexture, nonspecific.  Incidental note is made of renal hyperechogenicity.  IMPRESSION: 1. Gallstones with nonspecific gallbladder wall thickening. Sonographic Percell Miller sign could not the evaluated secondary to patient being pre-medicated. Acute cholecystitis cannot be entirely excluded. CT  or nuclear medicine scintigraphy could be informative. 2. Renal echogenicity, suggesting medical renal disease.   Electronically Signed   By: Abigail Miyamoto M.D.   On: 05/01/2015 12:57     Code Status: Full Family Communication: Pt at bedside Disposition Plan: Admit for further evaluation    Mart Piggs [facility] [phone]   Review of  Systems:  Constitutional: Negative for fever, chills. Negative for diaphoresis.  HENT: Negative for hearing loss, ear pain, nosebleeds, congestion, sore throat, neck pain, tinnitus and ear discharge.   Eyes: Negative for blurred vision, double vision, photophobia, pain, discharge and redness.  Respiratory: Negative for cough, hemoptysis, wheezing and stridor.   Cardiovascular: Negative for chest pain, claudication and leg swelling.  Gastrointestinal: per HPI Genitourinary: Negative for dysuria, urgency, frequency, hematuria and flank pain.  Musculoskeletal: Negative for myalgias, back pain, joint pain and falls.  Skin: Negative for itching and rash.  Neurological: Negative for dizziness and weakness. Endo/Heme/Allergies: Negative for environmental allergies and polydipsia. Does not bruise/bleed easily.  Psychiatric/Behavioral: Negative for suicidal ideas. The patient is not nervous/anxious.      Past Medical History  Diagnosis Date  . Neuropathy   . Glaucoma   . Bipolar 1 disorder   . Refusal of blood transfusions as patient is Jehovah's Witness   . Shortness of breath   . TIA (transient ischemic attack) 2010  . Neuromuscular disorder     DIABETIC NEUROPATHY  . MRSA infection ?2014    "on the back of my head; spread throughout my bloodstream"  . Bipolar disorder, unspecified 02/06/2014  . High cholesterol   . Type II diabetes mellitus     INSULIN DEPENDENT  . Hepatitis C   . Chronic pain syndrome   . Schizoaffective disorder, unspecified condition 02/08/2014  . Seizure dx'd 2014    "don't know what kind; last one was ~ 04/2014"  . Chronic combined systolic and diastolic CHF (congestive heart failure)     EF 40% by echo and 48% by stress test  . Anemia   . Hypertension   . Stroke     hx TIA  . Neuromuscular disorder     diabetic neuropathy  . Shortness of breath dyspnea   . Schizophrenia   . Seizures   . Hepatitis     hep C  . Bipolar disorder   . Glaucoma   . Chronic  pain   . Diabetes mellitus without complication     on Insulin  . Depression   . Anxiety   . Chronic kidney disease     chronic kidney stage 3 Dr Mercy Moore  . GERD (gastroesophageal reflux disease)   . History of hiatal hernia   . Headache     sinus headaches    Past Surgical History  Procedure Laterality Date  . Amputation Left 05/21/2013    Procedure: Left Midfoot AMPUTATION;  Surgeon: Newt Minion, MD;  Location: [facility];  Service: Orthopedics;  Laterality: Left;  Left Midfoot Amputation  . Tubal ligation    . Colonoscopy with propofol N/A 06/22/2013    Procedure: COLONOSCOPY WITH PROPOFOL;  Surgeon: Jeryl Columbia, MD;  Location: [facility];  Service: Endoscopy;  Laterality: N/A;  . Abdominal hysterectomy  2014  . Tonsillectomy  1970's  . Refractive surgery Bilateral 2013  . Tubal ligation  2000  . Abdominal hysterectomy  2010  . Foot amputation Left     due diabetic neuropathy, could not feel ulcer on bottom of foot  . Eye surgery Bilateral 2010  Lasik    Social History:  reports that she quit smoking about 4 weeks ago. Her smoking use included Cigarettes. She has a 20 pack-year smoking history. She has never used smokeless tobacco. She reports that she uses illicit drugs ("Crack" cocaine and Cocaine). She reports that she does not drink alcohol.  Allergies  Allergen Reactions  . Gabapentin Itching and Swelling  . Gabapentin Swelling  . Lyrica [Pregabalin] Swelling    Facial and leg swelling  . Penicillins Hives    Pt tolerates cefepime and ceftriaxone  . Saphris [Asenapine] Swelling    Swelling of the face  . Saphris [Asenapine] Swelling    Facial swelling  . Tramadol Swelling    Causes legs to swell  . Tramadol Swelling    Leg swelling  . Penicillins Hives    Has tolerated Cefepime and Ceftriaxone.    Family History  Problem Relation Age of Onset  . Hypertension Mother   . Hypertension Father     Prior to Admission medications   Medication Sig Start  Date End Date Taking? Authorizing Irvin Bastin  amLODipine (NORVASC) 10 MG tablet Take 1 tablet (10 mg total) by mouth daily. 12/13/14  Yes Arnoldo Morale, MD  Blood Glucose Monitoring Suppl (ACCU-CHEK AVIVA PLUS) W/DEVICE KIT 1 each by Does not apply route 4 (four) times daily - after meals and at bedtime. 12/13/14  Yes Arnoldo Morale, MD  carvedilol (COREG) 25 MG tablet Take 1 tablet (25 mg total) by mouth 2 (two) times daily. 06/24/14  Yes Debbe Odea, MD  cloNIDine (CATAPRES) 0.1 MG tablet Take 0.1 mg by mouth 3 (three) times daily. 01/25/15  Yes Historical Alaura Schippers, MD  ferrous sulfate 325 (65 FE) MG tablet Take 1 tablet (325 mg total) by mouth daily with breakfast. 06/24/14  Yes Debbe Odea, MD  folic acid (FOLVITE) 488 MCG tablet Take 200 mcg by mouth every morning.    Yes Historical Magnus Crescenzo, MD  furosemide (LASIX) 40 MG tablet Take 2 tablets (80 mg total) by mouth 2 (two) times daily. 08/09/14  Yes Larene Pickett, PA-C  glucose blood (ACCU-CHEK AVIVA PLUS) test strip Use three times before meals and at bedtime 12/13/14  Yes Arnoldo Morale, MD  insulin aspart (NOVOLOG) 100 UNIT/ML injection Inject 2-10 Units into the skin 3 (three) times daily before meals. SSI: 0-200 = 2 units, 201-250 = 4 units, 251-300 = 6 units, 301-350 = 8 units, 351-400 = 10 units   Yes Historical Helix Lafontaine, MD  insulin glargine (LANTUS) 100 UNIT/ML injection Inject 0.36 mLs (36 Units total) into the skin at bedtime. 12/13/14  Yes Arnoldo Morale, MD  lactulose (CHRONULAC) 10 GM/15ML solution Take 15 mLs (10 g total) by mouth 3 (three) times daily. 12/13/14  Yes Arnoldo Morale, MD  Lancet Devices [facility]) lancets Use as instructed 12/13/14  Yes Arnoldo Morale, MD  levETIRAcetam (KEPPRA) 500 MG tablet Take 500 mg by mouth every 12 (twelve) hours. 01/25/15  Yes Historical Marlos Carmen, MD  Multiple Vitamin (MULTIVITAMIN WITH MINERALS) TABS tablet Take 1 tablet by mouth daily.   Yes Historical Jameon Deller, MD  mupirocin cream (BACTROBAN) 2 %  Apply topically daily. Patient taking differently: Apply 1 application topically daily as needed (itching, skin irritation).  08/27/14  Yes Geradine Girt, DO  oxyCODONE (OXY IR/ROXICODONE) 5 MG immediate release tablet Take 5 mg by mouth every 6 (six) hours as needed for moderate pain or severe pain.  04/25/15  Yes Historical Loui Massenburg, MD  pantoprazole (PROTONIX) 40 MG tablet Take 1  tablet (40 mg total) by mouth 2 (two) times daily. 11/24/14  Yes Thurnell Lose, MD  polyethylene glycol (MIRALAX / GLYCOLAX) packet Take 17 g by mouth 2 (two) times daily. Mix in 8 oz liquid and drink 08/24/14  Yes Historical Ej Pinson, MD  aspirin EC 325 MG EC tablet Take 1 tablet (325 mg total) by mouth daily. Patient not taking: Reported on 05/01/2015 08/27/14   Geradine Girt, DO  atorvastatin (LIPITOR) 40 MG tablet Take 1 tablet (40 mg total) by mouth daily at 6 PM. Patient not taking: Reported on 12/13/2014 11/11/14   Jonetta Osgood, MD  cloNIDine (CATAPRES) 0.2 MG tablet Take 1 tablet (0.2 mg total) by mouth at bedtime. Patient not taking: Reported on 05/01/2015 12/13/14   Arnoldo Morale, MD  oxyCODONE-acetaminophen (PERCOCET) 5-325 MG per tablet Take 1 tablet by mouth every 6 (six) hours as needed for severe pain. Patient not taking: Reported on 05/01/2015 01/13/15   Mercedes Camprubi-Soms, PA-C  oxyCODONE-acetaminophen (PERCOCET/ROXICET) 5-325 MG per tablet Take 1 tablet by mouth every 6 (six) hours as needed for moderate pain. Patient not taking: Reported on 05/01/2015 11/24/14   Thurnell Lose, MD    Physical Exam: Filed Vitals:   05/01/15 1230 05/01/15 1330 05/01/15 1400 05/01/15 1430  BP: 197/81 184/101 147/91 165/70  Pulse: 79 76 76 78  Temp:      TempSrc:      Resp: $Remo'13 13 16 14  'dCFYN$ Height:      Weight:      SpO2: 100% 100% 100% 100%    Physical Exam  Constitutional: Appears well-developed and well-nourished. No distress.  HENT: Normocephalic. External right and left ear normal. Oropharynx is clear and  moist.  Eyes: Conjunctivae and EOM are normal. PERRLA, no scleral icterus.  Neck: Normal ROM. Neck supple. No JVD. No tracheal deviation. No thyromegaly.  CVS: RRR, S1/S2 +, no gallops, no carotid bruit.  Pulmonary: Effort and breath sounds normal, no stridor, rhonchi, wheezes, rales.  Abdominal: Soft. BS +,  no distension, tenderness in RUQ area, no rebound or guarding.  Musculoskeletal: Normal range of motion.  Lymphadenopathy: No lymphadenopathy noted, cervical, inguinal. Neuro: Alert. Normal reflexes, muscle tone coordination. No cranial nerve deficit. Skin: Skin is warm and dry. No rash noted. Not diaphoretic. No erythema. No pallor.  Psychiatric: Normal mood and affect.   Labs on Admission:  Basic Metabolic Panel:  Recent Labs Lab 05/01/15 1112  NA 126*  K 3.5  CL 105  CO2 11*  GLUCOSE 422*  BUN 69*  CREATININE 8.23*  CALCIUM 8.3*   Liver Function Tests:  Recent Labs Lab 05/01/15 1112  AST 47*  ALT 64*  ALKPHOS 97  BILITOT 0.6  PROT 7.2  ALBUMIN 2.4*    Recent Labs Lab 05/01/15 1112  LIPASE 70*   No results for input(s): AMMONIA in the last 168 hours. CBC:  Recent Labs Lab 05/01/15 1112  WBC 3.0*  NEUTROABS 2.2  HGB 11.3*  HCT 34.1*  MCV 82.0  PLT 176   Cardiac Enzymes: No results for input(s): CKTOTAL, CKMB, CKMBINDEX, TROPONINI in the last 168 hours. BNP: Invalid input(s): POCBNP CBG:  Recent Labs Lab 05/01/15 1252  GLUCAP 396*    RAQ:[id number]    If 7PM-7AM, please contact night-coverage [email or web address] Password [facility] 05/01/2015, 2:41 PM

## 2015-05-01 NOTE — ED Notes (Signed)
Pt came in via EMS with upset stomach and N/V especially after eating for the last 3-4 days.  Pt denies taking any OTC medications to try and alleviate symptoms.  Pt also arrives with glu @ 340, EMS states she ran out of test strips and has been dosing based on past hx instead of taking readings.

## 2015-05-01 NOTE — ED Notes (Signed)
[facility] called.Marland Kitchenklj

## 2015-05-02 ENCOUNTER — Inpatient Hospital Stay (HOSPITAL_COMMUNITY): Payer: Medicare Other

## 2015-05-02 DIAGNOSIS — F3181 Bipolar II disorder: Secondary | ICD-10-CM

## 2015-05-02 DIAGNOSIS — Z992 Dependence on renal dialysis: Secondary | ICD-10-CM

## 2015-05-02 DIAGNOSIS — N189 Chronic kidney disease, unspecified: Secondary | ICD-10-CM

## 2015-05-02 DIAGNOSIS — D631 Anemia in chronic kidney disease: Secondary | ICD-10-CM

## 2015-05-02 DIAGNOSIS — N185 Chronic kidney disease, stage 5: Secondary | ICD-10-CM

## 2015-05-02 LAB — COMPREHENSIVE METABOLIC PANEL
ALBUMIN: 1.8 g/dL — AB (ref 3.5–5.0)
ALK PHOS: 66 U/L (ref 38–126)
ALT: 47 U/L (ref 14–54)
AST: 36 U/L (ref 15–41)
Anion gap: 12 (ref 5–15)
BILIRUBIN TOTAL: 0.5 mg/dL (ref 0.3–1.2)
BUN: 61 mg/dL — AB (ref 6–20)
CALCIUM: 7.6 mg/dL — AB (ref 8.9–10.3)
CO2: 11 mmol/L — ABNORMAL LOW (ref 22–32)
Chloride: 108 mmol/L (ref 101–111)
Creatinine, Ser: 8.12 mg/dL — ABNORMAL HIGH (ref 0.44–1.00)
GFR calc Af Amer: 6 mL/min — ABNORMAL LOW (ref >60)
GFR calc non Af Amer: 5 mL/min — ABNORMAL LOW (ref >60)
GLUCOSE: 135 mg/dL — AB (ref 65–99)
Potassium: 3.4 mmol/L — ABNORMAL LOW (ref 3.5–5.1)
Sodium: 131 mmol/L — ABNORMAL LOW (ref 135–145)
TOTAL PROTEIN: 5.5 g/dL — AB (ref 6.5–8.1)

## 2015-05-02 LAB — CBC
HEMATOCRIT: 28.6 % — AB (ref 36.0–46.0)
HEMOGLOBIN: 9.4 g/dL — AB (ref 12.0–15.0)
MCH: 27.2 pg (ref 26.0–34.0)
MCHC: 32.9 g/dL (ref 30.0–36.0)
MCV: 82.7 fL (ref 78.0–100.0)
Platelets: 160 10*3/uL (ref 150–400)
RBC: 3.46 MIL/uL — ABNORMAL LOW (ref 3.87–5.11)
RDW: 16.4 % — ABNORMAL HIGH (ref 11.5–15.5)
WBC: 2.9 10*3/uL — AB (ref 4.0–10.5)

## 2015-05-02 LAB — IRON AND TIBC
IRON: 40 ug/dL (ref 28–170)
SATURATION RATIOS: 24 % (ref 10.4–31.8)
TIBC: 168 ug/dL — AB (ref 250–450)
UIBC: 128 ug/dL

## 2015-05-02 LAB — GLUCOSE, CAPILLARY
GLUCOSE-CAPILLARY: 65 mg/dL (ref 65–99)
Glucose-Capillary: 145 mg/dL — ABNORMAL HIGH (ref 65–99)
Glucose-Capillary: 125 mg/dL — ABNORMAL HIGH (ref 65–99)
Glucose-Capillary: 191 mg/dL — ABNORMAL HIGH (ref 65–99)

## 2015-05-02 LAB — RAPID HIV SCREEN (HIV 1/2 AB+AG)
HIV 1/2 Antibodies: NONREACTIVE
HIV-1 P24 Antigen - HIV24: NONREACTIVE

## 2015-05-02 LAB — MRSA PCR SCREENING: MRSA BY PCR: NEGATIVE

## 2015-05-02 LAB — FERRITIN: FERRITIN: 316 ng/mL — AB (ref 11–307)

## 2015-05-02 LAB — PHOSPHORUS: Phosphorus: 4.7 mg/dL — ABNORMAL HIGH (ref 2.5–4.6)

## 2015-05-02 MED ORDER — TECHNETIUM TC 99M MEBROFENIN IV KIT
5.0000 | PACK | Freq: Once | INTRAVENOUS | Status: DC | PRN
  Administered 2015-05-02: 5 via INTRAVENOUS
  Filled 2015-05-02: qty 6

## 2015-05-02 MED ORDER — DIPHENHYDRAMINE HCL 25 MG PO CAPS
25.0000 mg | ORAL_CAPSULE | Freq: Once | ORAL | Status: AC
  Administered 2015-05-02: 25 mg via ORAL
  Filled 2015-05-02: qty 1

## 2015-05-02 MED ORDER — DARBEPOETIN ALFA 100 MCG/0.5ML IJ SOSY
100.0000 ug | PREFILLED_SYRINGE | INTRAMUSCULAR | Status: DC
  Administered 2015-05-03: 100 ug via INTRAVENOUS

## 2015-05-02 MED ORDER — DIPHENHYDRAMINE HCL 25 MG PO CAPS
25.0000 mg | ORAL_CAPSULE | Freq: Four times a day (QID) | ORAL | Status: AC | PRN
  Administered 2015-05-02 – 2015-05-03 (×2): 25 mg via ORAL
  Filled 2015-05-02 (×2): qty 1

## 2015-05-02 MED ORDER — RENA-VITE PO TABS
1.0000 | ORAL_TABLET | Freq: Every day | ORAL | Status: DC
  Administered 2015-05-02 – 2015-05-06 (×5): 1 via ORAL
  Filled 2015-05-02 (×5): qty 1

## 2015-05-02 NOTE — Progress Notes (Signed)
[facility] Progress Note    Subjective: Patient sent to [facility] for renal failure and need for dialysis.  She has some epigastric pain and tenderness, but this could be from uremia.  LFT's are normal, WBC is low.  She is afebrile and has no Murphy's sign.  Will need to have dialysis first then we will review HIDA results.  Objective: Vital signs in last 24 hours: Temp:  [97.7 F (36.5 C)-98.8 F (37.1 C)] 98.8 F (37.1 C) (09/27 0400) Pulse Rate:  [76-97] 76 (09/27 0400) Resp:  [12-21] 13 (09/27 0400) BP: (118-243)/(70-110) 118/76 mmHg (09/27 0500) SpO2:  [98 %-100 %] 100 % (09/27 0400) Weight:  [73.3 kg (161 lb 9.6 oz)-79.379 kg (175 lb)] 78.5 kg (173 lb 1 oz) (09/27 0442)    Intake/Output from previous day: 09/26 0701 - 09/27 0700 In: 367.5 [I.V.:367.5] Out: 250 [Urine:250] Intake/Output this shift:    General: Says she is having lots of abdominal pian.  Lungs: Clear  Abd: Soft, good bowel sounds.  Has an excellent appetite.  Extremities: No changes  Neuro: Intact  Lab Results:  WBC 2.9K, hemoglobin 9.4, Platelets are okay.  Acodotic. BMET ) Recent Labs  05/01/15 1647 05/02/15 0330  NA 128* 131*  K 3.4* 3.4*  CL 105 108  CO2 13* 11*  GLUCOSE 388* 135*  BUN 65* 61*  CREATININE 7.78* 8.12*  CALCIUM 8.4* 7.6*   PT/INR No results for input(s): LABPROT, INR in the last 72 hours. ABG  Recent Labs  05/01/15 1344  HCO3 12.3*    Studies/Results: US Abdomen Limited Ruq  05/01/2015   CLINICAL DATA:  Right upper quadrant abdominal pain. Nausea and vomiting for 5 days. Hepatitis. Chronic kidney disease.  EXAM: US ABDOMEN LIMITED - RIGHT UPPER QUADRANT  COMPARISON:  Renal ultrasound of 11/09/2014.  FINDINGS: Gallbladder:  Multiple gallstones, measuring up to 9 mm. Wall thickening, up to 5 mm. No pericholecystic edema. Murphy's sign not well evaluated; patient pre-medicated.  Common bile duct:  Diameter: Normal, 4 mm.  Liver:  Mildly heterogeneous hepatic echotexture,  nonspecific.  Incidental note is made of renal hyperechogenicity.  IMPRESSION: 1. Gallstones with nonspecific gallbladder wall thickening. Sonographic Percell Miller sign could not the evaluated secondary to patient being pre-medicated. Acute cholecystitis cannot be entirely excluded. CT or nuclear medicine scintigraphy could be informative. 2. Renal echogenicity, suggesting medical renal disease.   Electronically Signed   By: Abigail Miyamoto M.D.   On: 05/01/2015 12:57    Anti-infectives: Anti-infectives    None      Assessment/Plan: s/p  Await HIDA scan results  Also, patient has to be resuscitated and dialyzed for optimal condition for possible surgery if HIDA is positive for cystic duct obstruction.  LOS: 1 day   Kathryne Eriksson. [name], MD, FACS [phone] [facility] Surgery 05/02/2015

## 2015-05-02 NOTE — Progress Notes (Signed)
Utilization Review Completed.[name], [name] T9/27/2016  

## 2015-05-02 NOTE — Consult Note (Signed)
[name] is an 49 y.o. female referred by Dr Sherral Hammers   Chief Complaint: Uremia, anemia, sec HPTH HPI: 49yo BF with CKD 5 sec DM who has been refusing HD and was admitted yest with abd pain and N/V x 5 days.  On further questioning she has been having issues with int nausea for weeks along with increasing fatigue.  She has not followed up since 5/16 at which time her Scr was around 5.  Now Scr 8.12.  She has now stated she will do HD and is willing to proceed with catheter and perm access.  Abd US shows gallstones which may be additional reason for N/V.  Past Medical History  Diagnosis Date  . Neuropathy   . Glaucoma   . Bipolar 1 disorder   . Refusal of blood transfusions as patient is Jehovah's Witness   . Shortness of breath   . TIA (transient ischemic attack) 2010  . Neuromuscular disorder     DIABETIC NEUROPATHY  . MRSA infection ?2014    "on the back of my head; spread throughout my bloodstream"  . Bipolar disorder, unspecified 02/06/2014  . High cholesterol   . Type II diabetes mellitus     INSULIN DEPENDENT  . Hepatitis C   . Chronic pain syndrome   . Chronic kidney disease (CKD), stage III (moderate)   . Schizoaffective disorder, unspecified condition 02/08/2014  . Seizure dx'd 2014    "don't know what kind; last one was ~ 04/2014"  . Chronic combined systolic and diastolic CHF (congestive heart failure)     EF 40% by echo and 48% by stress test  . Anemia   . Hypertension   . Stroke     hx TIA  . Neuromuscular disorder     diabetic neuropathy  . Shortness of breath dyspnea   . Schizophrenia   . Seizures   . Hepatitis     hep C  . Bipolar disorder   . Glaucoma   . Chronic pain   . Diabetes mellitus without complication     on Insulin  . Depression   . Anxiety   . Chronic kidney disease     chronic kidney stage 3 Dr Mercy Moore  . GERD (gastroesophageal reflux disease)   . History of hiatal hernia   . Headache     sinus headaches  . Pancytopenia     Past  Surgical History  Procedure Laterality Date  . Amputation Left 05/21/2013    Procedure: Left Midfoot AMPUTATION;  Surgeon: Newt Minion, MD;  Location: [place];  Service: Orthopedics;  Laterality: Left;  Left Midfoot Amputation  . Tubal ligation    . Colonoscopy with propofol N/A 06/22/2013    Procedure: COLONOSCOPY WITH PROPOFOL;  Surgeon: Jeryl [place], MD;  Location: [facility];  Service: Endoscopy;  Laterality: N/A;  . Abdominal hysterectomy  2014  . Tonsillectomy  1970's  . Refractive surgery Bilateral 2013  . Tubal ligation  2000  . Abdominal hysterectomy  2010  . Foot amputation Left     due diabetic neuropathy, could not feel ulcer on bottom of foot  . Eye surgery Bilateral 2010    Lasik    Family History  Problem Relation Age of Onset  . Hypertension Mother   . Hypertension Father   + for ESRD in her mother  Social History:  reports that she quit smoking about 4 weeks ago. Her smoking use included Cigarettes. She has a 20 pack-year smoking history. She has  never used smokeless tobacco. She reports that she uses illicit drugs ("Crack" cocaine and Cocaine). She reports that she does not drink alcohol.  Lives with her aunt.  Allergies:  Allergies  Allergen Reactions  . Gabapentin Itching and Swelling  . Gabapentin Swelling  . Lyrica [Pregabalin] Swelling    Facial and leg swelling  . Other     NO BLOOD PRODUCTS. PT IS A JEHOVAH WITNESS  . Penicillins Hives    Pt tolerates cefepime and ceftriaxone  . Saphris [Asenapine] Swelling    Swelling of the face  . Saphris [Asenapine] Swelling    Facial swelling  . Tramadol Swelling    Causes legs to swell  . Tramadol Swelling    Leg swelling  . Penicillins Hives    Has tolerated Cefepime and Ceftriaxone.    Medications Prior to Admission  Medication Sig Dispense Refill  . amLODipine (NORVASC) 10 MG tablet Take 1 tablet (10 mg total) by mouth daily. 30 tablet 3  . Blood Glucose Monitoring Suppl (ACCU-CHEK AVIVA PLUS)  W/DEVICE KIT 1 each by Does not apply route 4 (four) times daily - after meals and at bedtime. 1 kit 0  . carvedilol (COREG) 25 MG tablet Take 1 tablet (25 mg total) by mouth 2 (two) times daily. 60 tablet 0  . cloNIDine (CATAPRES) 0.1 MG tablet Take 0.1 mg by mouth 3 (three) times daily.  0  . ferrous sulfate 325 (65 FE) MG tablet Take 1 tablet (325 mg total) by mouth daily with breakfast. 30 tablet 0  . folic acid (FOLVITE) 161 MCG tablet Take 200 mcg by mouth every morning.     . furosemide (LASIX) 40 MG tablet Take 2 tablets (80 mg total) by mouth 2 (two) times daily. 60 tablet 0  . glucose blood (ACCU-CHEK AVIVA PLUS) test strip Use three times before meals and at bedtime 100 each 12  . insulin aspart (NOVOLOG) 100 UNIT/ML injection Inject 2-10 Units into the skin 3 (three) times daily before meals. SSI: 0-200 = 2 units, 201-250 = 4 units, 251-300 = 6 units, 301-350 = 8 units, 351-400 = 10 units    . insulin glargine (LANTUS) 100 UNIT/ML injection Inject 0.36 mLs (36 Units total) into the skin at bedtime. 10 mL 11  . lactulose (CHRONULAC) 10 GM/15ML solution Take 15 mLs (10 g total) by mouth 3 (three) times daily. 946 mL 0  . Lancet Devices (ACCU-CHEK SOFTCLIX) lancets Use as instructed 1 each 0  . levETIRAcetam (KEPPRA) 500 MG tablet Take 500 mg by mouth every 12 (twelve) hours.  0  . Multiple Vitamin (MULTIVITAMIN WITH MINERALS) TABS tablet Take 1 tablet by mouth daily.    . mupirocin cream (BACTROBAN) 2 % Apply topically daily. (Patient taking differently: Apply 1 application topically daily as needed (itching, skin irritation). ) 15 g 0  . oxyCODONE (OXY IR/ROXICODONE) 5 MG immediate release tablet Take 5 mg by mouth every 6 (six) hours as needed for moderate pain or severe pain.   0  . pantoprazole (PROTONIX) 40 MG tablet Take 1 tablet (40 mg total) by mouth 2 (two) times daily. 60 tablet 2  . polyethylene glycol (MIRALAX / GLYCOLAX) packet Take 17 g by mouth 2 (two) times daily. Mix in 8  oz liquid and drink  11  . aspirin EC 325 MG EC tablet Take 1 tablet (325 mg total) by mouth daily. (Patient not taking: Reported on 05/01/2015) 30 tablet 0  . atorvastatin (LIPITOR) 40 MG tablet Take 1  tablet (40 mg total) by mouth daily at 6 PM. (Patient not taking: Reported on 12/13/2014) 30 tablet 0  . cloNIDine (CATAPRES) 0.2 MG tablet Take 1 tablet (0.2 mg total) by mouth at bedtime. (Patient not taking: Reported on 05/01/2015) 30 tablet 2  . oxyCODONE-acetaminophen (PERCOCET) 5-325 MG per tablet Take 1 tablet by mouth every 6 (six) hours as needed for severe pain. (Patient not taking: Reported on 05/01/2015) 6 tablet 0  . oxyCODONE-acetaminophen (PERCOCET/ROXICET) 5-325 MG per tablet Take 1 tablet by mouth every 6 (six) hours as needed for moderate pain. (Patient not taking: Reported on 05/01/2015) 30 tablet 0     Lab Results: UA: >300 protein, 7-10 rbc, 0-2 wbc   Recent Labs  05/01/15 1112 05/02/15 0330  WBC 3.0* 2.9*  HGB 11.3* 9.4*  HCT 34.1* 28.6*  PLT 176 160   BMET  Recent Labs  05/01/15 1112 05/01/15 1647 05/02/15 0330  NA 126* 128* 131*  K 3.5 3.4* 3.4*  CL 105 105 108  CO2 11* 13* 11*  GLUCOSE 422* 388* 135*  BUN 69* 65* 61*  CREATININE 8.23* 7.78* 8.12*  CALCIUM 8.3* 8.4* 7.6*   LFT  Recent Labs  05/02/15 0330  PROT 5.5*  ALBUMIN 1.8*  AST 36  ALT 47  ALKPHOS 66  BILITOT 0.5   US Abdomen Limited Ruq  05/01/2015   CLINICAL DATA:  Right upper quadrant abdominal pain. Nausea and vomiting for 5 days. Hepatitis. Chronic kidney disease.  EXAM: US ABDOMEN LIMITED - RIGHT UPPER QUADRANT  COMPARISON:  Renal ultrasound of 11/09/2014.  FINDINGS: Gallbladder:  Multiple gallstones, measuring up to 9 mm. Wall thickening, up to 5 mm. No pericholecystic edema. Murphy's sign not well evaluated; patient pre-medicated.  Common bile duct:  Diameter: Normal, 4 mm.  Liver:  Mildly heterogeneous hepatic echotexture, nonspecific.  Incidental note is made of renal  hyperechogenicity.  IMPRESSION: 1. Gallstones with nonspecific gallbladder wall thickening. Sonographic Percell Miller sign could not the evaluated secondary to patient being pre-medicated. Acute cholecystitis cannot be entirely excluded. CT or nuclear medicine scintigraphy could be informative. 2. Renal echogenicity, suggesting medical renal disease.   Electronically Signed   By: Abigail Miyamoto M.D.   On: 05/01/2015 12:57    ROS: No change in vision No SOB No CP No abd pain currently but says usually comes on after she eats Pain in Rt elbow from chronic dislocation + numbness hands and feet No dysuria  PHYSICAL EXAM: Blood pressure 115/61, pulse 73, temperature 98.3 F (36.8 C), temperature source Oral, resp. rate 13, height 5' 4" (1.626 m), weight 78.5 kg (173 lb 1 oz), SpO2 99 %. HEENT: PERRLA EOMI NECK:No JVD LUNGS:Clear CARDIAC:RRR 1/6 systolic murmur, no m or R ABD:+ BS Soft ND, mild tenderness rt and lt upper quadrant, no rebound EXT:tr edema.  Deformity of Rt elbow with decreased range of motion.  Lt foot with well healed transmet NEURO:CNI, Ox3  No asterixis.  Decreased sensation feet.  Assessment: 1. ESRD sec DM/HTN 2. Anemia had been on aranesp but missed appts 3. Sec HPTH 4. HTN 5. DM 6. Hep C 7. Gallstones 8. Jehovah's Witness but has accepted transfusion in the past PLAN: 1. She is due to get catheter placed today and will do HD 2. Vein mapping.  With issues with her Rt elbow, lt arm would be best for access.  Will ask [facility] to see.  She will need perm access prior to DC as she will not be accepted at outpt unit. 3.  Check hep C RNA as you have ordered 4. Resume aranesp 5. Check iron studies.  DC po iron as will give IV if needed 6. Show dialysis videos 7. Dietician to see 8. Move IV to Rt arm   MATTINGLY,MICHAEL T 05/02/2015, 9:47 AM

## 2015-05-02 NOTE — Consult Note (Signed)
History of Present Illness:  Patient is a 49 y.o. year old female who presents for placement of a permanent hemodialysis access. The patient is right handed.  She states she has used ilisit drugs in the past.   The patient is not currently on hemodialysis.  IR will place catheter today to start HD.  We are being asked to provide permanent access for HD.  Past medical history includes:  chronic kidney disease stage V with associated anemia of chronic disease and iron deficiency, uncontrolled diabetes mellitus, hypertension managed with clonidine, hyperlipidemia managed with lipitor, bipolar disorder and schizoaffective disorder, hepatitis C, has refused dialysis in the past.  Past Medical History  Diagnosis Date  . Neuropathy   . Glaucoma   . Bipolar 1 disorder   . Refusal of blood transfusions as patient is Jehovah's Witness   . Shortness of breath   . TIA (transient ischemic attack) 2010  . Neuromuscular disorder     DIABETIC NEUROPATHY  . MRSA infection ?2014    "on the back of my head; spread throughout my bloodstream"  . Bipolar disorder, unspecified 02/06/2014  . High cholesterol   . Type II diabetes mellitus     INSULIN DEPENDENT  . Hepatitis C   . Chronic pain syndrome   . Chronic kidney disease (CKD), stage III (moderate)   . Schizoaffective disorder, unspecified condition 02/08/2014  . Seizure dx'd 2014    "don't know what kind; last one was ~ 04/2014"  . Chronic combined systolic and diastolic CHF (congestive heart failure)     EF 40% by echo and 48% by stress test  . Anemia   . Hypertension   . Stroke     hx TIA  . Neuromuscular disorder     diabetic neuropathy  . Shortness of breath dyspnea   . Schizophrenia   . Seizures   . Hepatitis     hep C  . Bipolar disorder   . Glaucoma   . Chronic pain   . Diabetes mellitus without complication     on Insulin  . Depression   . Anxiety   . Chronic kidney disease     chronic kidney stage 3 Dr Mercy Moore  . GERD  (gastroesophageal reflux disease)   . History of hiatal hernia   . Headache     sinus headaches  . Pancytopenia     Past Surgical History  Procedure Laterality Date  . Amputation Left 05/21/2013    Procedure: Left Midfoot AMPUTATION;  Surgeon: Newt Minion, MD;  Location: [place];  Service: Orthopedics;  Laterality: Left;  Left Midfoot Amputation  . Tubal ligation    . Colonoscopy with propofol N/A 06/22/2013    Procedure: COLONOSCOPY WITH PROPOFOL;  Surgeon: Jeryl [place], MD;  Location: [facility];  Service: Endoscopy;  Laterality: N/A;  . Abdominal hysterectomy  2014  . Tonsillectomy  1970's  . Refractive surgery Bilateral 2013  . Tubal ligation  2000  . Abdominal hysterectomy  2010  . Foot amputation Left     due diabetic neuropathy, could not feel ulcer on bottom of foot  . Eye surgery Bilateral 2010    Lasik     Social History Social History  Substance Use Topics  . Smoking status: Former Smoker -- 1.00 packs/day for 20 years    Types: Cigarettes    Quit date: 03/31/2015  . Smokeless tobacco: Never Used     Comment: "stopped smoking in ~ 2013; restarted last week; stopped again 06/17/2014"  .  Alcohol Use: No     Comment: 05/01/15 patient denies alcohol use.    Family History Family History  Problem Relation Age of Onset  . Hypertension Mother   . Hypertension Father     Allergies  Allergies  Allergen Reactions  . Gabapentin Itching and Swelling  . Gabapentin Swelling  . Lyrica [Pregabalin] Swelling    Facial and leg swelling  . Other     NO BLOOD PRODUCTS. PT IS A JEHOVAH WITNESS  . Penicillins Hives    Pt tolerates cefepime and ceftriaxone  . Saphris [Asenapine] Swelling    Swelling of the face  . Saphris [Asenapine] Swelling    Facial swelling  . Tramadol Swelling    Causes legs to swell  . Tramadol Swelling    Leg swelling  . Penicillins Hives    Has tolerated Cefepime and Ceftriaxone.     Current Facility-Administered Medications    Medication Dose Route Frequency Provider Last Rate Last Dose  . antiseptic oral rinse (CPC / CETYLPYRIDINIUM CHLORIDE 0.05%) solution 7 mL  7 mL Mouth Rinse q12n4p Theodis Blaze, MD      . carvedilol (COREG) tablet 25 mg  25 mg Oral BID WC Theodis Blaze, MD   25 mg at 05/02/15 0800  . chlorhexidine (PERIDEX) 0.12 % solution 15 mL  15 mL Mouth Rinse BID Theodis Blaze, MD   15 mL at 05/02/15 0931  . cloNIDine (CATAPRES) tablet 0.1 mg  0.1 mg Oral TID Theodis Blaze, MD   0.1 mg at 05/02/15 0930  . Darbepoetin Alfa (ARANESP) injection 100 mcg  100 mcg Intravenous Q Tue-HD Fleet Contras, MD      . folic acid (FOLVITE) tablet 0.5 mg  0.5 mg Oral Daily Theodis Blaze, MD   0.5 mg at 05/02/15 0930  . hydrALAZINE (APRESOLINE) injection 5 mg  5 mg Intravenous Q4H PRN Theodis Blaze, MD      . hydrALAZINE (APRESOLINE) tablet 10 mg  10 mg Oral 3 times per day Theodis Blaze, MD   10 mg at 05/02/15 0600  . HYDROmorphone (DILAUDID) injection 1 mg  1 mg Intravenous Q2H PRN Theodis Blaze, MD   1 mg at 05/02/15 0600  . [START ON 05/03/2015] Influenza vac split quadrivalent PF (FLUARIX) injection 0.5 mL  0.5 mL Intramuscular Tomorrow-1000 Theodis Blaze, MD      . insulin aspart (novoLOG) injection 0-9 Units  0-9 Units Subcutaneous TID WC Theodis Blaze, MD   1 Units at 05/02/15 0902  . insulin glargine (LANTUS) injection 36 Units  36 Units Subcutaneous QHS Theodis Blaze, MD   36 Units at 05/01/15 2113  . levETIRAcetam (KEPPRA) tablet 500 mg  500 mg Oral Q12H Theodis Blaze, MD   500 mg at 05/02/15 0930  . multivitamin (RENA-VIT) tablet 1 tablet  1 tablet Oral QHS Fleet Contras, MD      . ondansetron [facility]) tablet 4 mg  4 mg Oral Q6H PRN Theodis Blaze, MD       Or  . ondansetron [facility]) injection 4 mg  4 mg Intravenous Q6H PRN Theodis Blaze, MD   4 mg at 05/02/15 0339    ROS:   General:  No weight loss, Fever, chills  HEENT: No recent headaches, no nasal bleeding, no visual changes, no sore  throat  Neurologic: No dizziness, blackouts, seizures. No recent symptoms of stroke or mini- stroke. No recent episodes of slurred speech, or temporary blindness.  Cardiac: No recent episodes of chest pain/pressure, no shortness of breath at rest.  No shortness of breath with exertion.  Denies history of atrial fibrillation or irregular heartbeat  Vascular: No history of rest pain in feet.  No history of claudication.  No history of non-healing ulcer, No history of DVT   Pulmonary: No home oxygen, no productive cough, no hemoptysis,  No asthma or wheezing  Musculoskeletal:  [ ]  Arthritis, [ ]  Low back pain,  [ ]  Joint pain  Hematologic:No history of hypercoagulable state.  No history of easy bleeding.  No history of anemia  Gastrointestinal: No hematochezia or melena,  No gastroesophageal reflux, no trouble swallowing Positive N/V, RUQ pain at admission.   Urinary: [ ]  chronic Kidney disease, [ ]  on HD - [ ]  MWF or [ ]  TTHS, [ ]  Burning with urination, [ ]  Frequent urination, [ ]  Difficulty urinating;   Skin: No rashes  Psychological: No history of anxiety,  No history of depression   Physical Examination  Filed Vitals:   05/02/15 0442 05/02/15 0500 05/02/15 0829 05/02/15 0930  BP:  118/76 115/61 115/61  Pulse:   73   Temp:   98.3 F (36.8 C)   TempSrc:   Oral   Resp:   13   Height:      Weight: 173 lb 1 oz (78.5 kg)     SpO2:   99%     Body mass index is 29.69 kg/(m^2).  General:  Alert/ sleepy and oriented, no acute distress HEENT: Normal Neck: No bruit or JVD Pulmonary: Clear to auscultation bilaterally Cardiac: Regular Rate and Rhythm without murmur Gastrointestinal: Soft, RUQ tender, non-distended Skin: No rash Extremity Pulses:  2+ radial, brachial pulses bilaterally Musculoskeletal: No deformity or edema  Neurologic: Upper and lower extremity motor 5/5 and symmetric  DATA:  Pending vein mapping  ASSESSMENT:  CKD stage V  PLAN: IR placement of HD  catheter today Pending vein mapping she is right hand dominant we will plan av fistula verses graft. She is Hep C positive Plan to move peripheral IV to right arm today.   COLLINS, EMMA MAUREEN PA-C [facility] [employer] [facility] with above. Although she is left-handed and she has elbow issues on the right and I agree with Dr. Mercy Moore that it would be best place her access in the left arm. An order has been written to move the IV to the right arm. Her vein mapping is pending. She is having a catheter placed today. I have scheduled her for placement of a left AV fistula or AV graft on Thursday. I have discussed the procedure potential complications and she is agreeable to proceed.  Deitra Mayo, MD, [place] [phone] Office: [phone]

## 2015-05-02 NOTE — Progress Notes (Signed)
[place] TEAM 1 - Stepdown/ICU TEAM Progress Note  [name] [id number] DOB: 01/15/1966 DOA: 05/01/2015 PCP: [name], MD  Admit HPI / Brief Narrative: Patient is 49 year old BF PMHx Bipolar DO, Schizoaffective DO, CKD Stage V (refused dialysis). with associated Anemia of Chronic Dz/ Fe deficiency, Uncontrolled DM Type 2, HTN, hypertension, , Hepatitis C,   Now presented to [facility] emergency department with main concern of several days duration of progressively worsening right upper quadrant abdominal pain, constant and throbbing, 7 out of 10 in severity when present, radiating to entire abdominal area, worse with eating and with no specific alleviating factors. Patient denies similar events in the past. No fevers or chills.   In emergency department, blood pressure noted to be 240/106, blood work notable for WBC 3.0, Hg 11.3, sodium 126, bicarbonate 11, creatinine 8.23, glucose 422. Imaging studies notable for gallstones and nonspecific gallbladder wall thickening. Nephrology and surgery team consulted. Admit to step down unit for further evaluation and management.  HPI/Subjective: 9/27 A/O 4, NAD, negative N/V only complaint is that she's hungry   Assessment and Plan: RUQ abdominal pain -Resolved - non specific findings on abd [place] - high scan pending; surgery consulted  - no indication for surgical intervention at this time   Acute on CKD (chronic kidney disease) stage 5, GFR less than 15 ml/min - IVF have been provided and Cr is trending down - will hold lasix today and continue IVF - reassess clinical status in Am and resume lasix as indicated - nephrology team Will place Dialysis Cath. And start HD   Diabetes mellitus with nephropathy - stop insulin drip that was started in ED - place on home Lantus regimen and add SSI   Hypertensive urgency - continue Norvasc that pt takes at home, Clonidine, Coreg - add hydralazine scheduled and as needed   -Resolved   Acute hyponatremia (mild) - holding lasix for now and Na is already up from 126 --> 128 - Resolving   Chronic combined systolic and diastolic CHF (congestive heart failure) - close monitoring ov volume status - may be able to resume Lasix in AM   Metabolic acidosis - improving with IVF - BMP in AM   Hypokalemia - supplement and repeat BMP in AM   Leukopenia - chronic and at baseline  - CBC in AM   Hep C w/o coma, chronic   Bipolar disorder - stable mental status    Anemia of chronic kidney failure - No signs of active bleeding, Hg stable - CBC in AM      Code Status: FULL Family Communication: no family present at time of exam Disposition Plan: Per nephrology    Consultants: Dr.Christopher Nicole Cella (vascular surgery)  Dr.Michael Mercy Moore (nephrology)  Procedure/Significant Events:    Culture 9/27 HIV pending 9/27 acute hepatitis panel pending 9/27 hepatitis C RNA Quant pending  Antibiotics:   DVT prophylaxis: SCD   Devices    LINES / TUBES:      Continuous Infusions:   Objective: VITAL SIGNS: Temp: 98.2 F (36.8 C) (09/27 2029) Temp Source: Oral (09/27 2029) BP: 123/63 mmHg (09/27 2029) Pulse Rate: 65 (09/27 2029) SPO2; FIO2:   Intake/Output Summary (Last 24 hours) at 05/02/15 2054 Last data filed at 05/02/15 0200  Gross per 24 hour  Intake  367.5 ml  Output    250 ml  Net  117.5 ml     Exam: General: A/O 4, NAD, negative N/V only complaint is that she's hungry, No  acute respiratory distress Eyes: Negative headache, eye pain, double vision,negative scleral hemorrhage ENT: Negative Runny nose, negative ear pain, negative gingival bleeding,* Neck:  Negative scars, masses, torticollis, lymphadenopathy, JVD Lungs: Clear to auscultation bilaterally without wheezes or crackles Cardiovascular: Regular rate and rhythm without murmur gallop or rub normal S1 and S2 Abdomen:negative abdominal pain,  nondistended, positive soft, bowel sounds, no rebound, no ascites, no appreciable mass Extremities: No significant cyanosis, clubbing, or edema bilateral lower extremities Psychiatric:  Negative depression, negative anxiety, negative fatigue, negative mania  Neurologic:  Cranial nerves II through XII intact, tongue/uvula midline, all extremities muscle strength 5/5, sensation intact throughout,  negative dysarthria, negative expressive aphasia, negative receptive aphasia.   Data Reviewed: Basic Metabolic Panel:  Recent Labs Lab 05/01/15 1112 05/01/15 1647 05/02/15 0330 05/02/15 0957  NA 126* 128* 131*  --   K 3.5 3.4* 3.4*  --   CL 105 105 108  --   CO2 11* 13* 11*  --   GLUCOSE 422* 388* 135*  --   BUN 69* 65* 61*  --   CREATININE 8.23* 7.78* 8.12*  --   CALCIUM 8.3* 8.4* 7.6*  --   PHOS  --   --   --  4.7*   Liver Function Tests:  Recent Labs Lab 05/01/15 1112 05/02/15 0330  AST 47* 36  ALT 64* 47  ALKPHOS 97 66  BILITOT 0.6 0.5  PROT 7.2 5.5*  ALBUMIN 2.4* 1.8*    Recent Labs Lab 05/01/15 1112  LIPASE 70*   No results for input(s): AMMONIA in the last 168 hours. CBC:  Recent Labs Lab 05/01/15 1112 05/02/15 0330  WBC 3.0* 2.9*  NEUTROABS 2.2  --   HGB 11.3* 9.4*  HCT 34.1* 28.6*  MCV 82.0 82.7  PLT 176 160   Cardiac Enzymes: No results for input(s): CKTOTAL, CKMB, CKMBINDEX, TROPONINI in the last 168 hours. BNP (last 3 results)  Recent Labs  09/30/14 1324 10/27/14 2146 11/09/14 0251  BNP 61.6 63.7 242.0*    ProBNP (last 3 results)  Recent Labs  06/22/14 1532  PROBNP 28725.0*    CBG:  Recent Labs Lab 05/01/15 2032 05/01/15 2146 05/01/15 2307 05/02/15 0830 05/02/15 1521  GLUCAP 282* 189* 145* 125* 65    Recent Results (from the past 240 hour(s))  MRSA PCR Screening     Status: None   Collection Time: 05/01/15 11:45 PM  Result Value Ref Range Status   MRSA by PCR NEGATIVE NEGATIVE Final    Comment:        The GeneXpert  MRSA Assay (FDA approved for NASAL specimens only), is one component of a comprehensive MRSA colonization surveillance program. It is not intended to diagnose MRSA infection nor to guide or monitor treatment for MRSA infections.      Studies:  Recent x-ray studies have been reviewed in detail by the Attending Physician  Scheduled Meds:  Scheduled Meds: . antiseptic oral rinse  7 mL Mouth Rinse q12n4p  . carvedilol  25 mg Oral BID WC  . chlorhexidine  15 mL Mouth Rinse BID  . cloNIDine  0.1 mg Oral TID  . darbepoetin (ARANESP) injection - DIALYSIS  100 mcg Intravenous Q Tue-HD  . folic acid  0.5 mg Oral Daily  . hydrALAZINE  10 mg Oral 3 times per day  . [START ON 05/03/2015] Influenza vac split quadrivalent PF  0.5 mL Intramuscular Tomorrow-1000  . insulin aspart  0-9 Units Subcutaneous TID WC  . insulin glargine  36 Units  Subcutaneous QHS  . levETIRAcetam  500 mg Oral Q12H  . multivitamin  1 tablet Oral QHS    Time spent on care of this patient: 40 mins   WOODS, Geraldo Docker , MD  [facility] Office  [phone] Pager - [phone]  On-Call/Text Page:      [email or web address]      password [email or web address]  If 7PM-7AM, please contact night-coverage [email or web address] Password [facility] 05/02/2015, 8:54 PM   LOS: 1 day   Care during the described time interval was provided by me .  I have reviewed this patient's available data, including medical history, events of note, physical examination, and all test results as part of my evaluation. I have personally reviewed and interpreted all radiology studies.   Dia Crawford, MD [phone] Pager

## 2015-05-02 NOTE — Progress Notes (Signed)
I had told the hospitalist that called me yest that if pt decided she would do HD the set up perm cath by IR.  She was NPO when I saw her earlier today and I thought that was for the perm cath but apparently it was for the HIDA scan and a perm cath was never ordered.  I ordered the perm cath but it won't be placed til AM

## 2015-05-03 ENCOUNTER — Encounter (HOSPITAL_COMMUNITY): Payer: Self-pay | Admitting: Radiology

## 2015-05-03 ENCOUNTER — Inpatient Hospital Stay (HOSPITAL_COMMUNITY): Payer: Medicare Other

## 2015-05-03 LAB — CBC WITH DIFFERENTIAL/PLATELET
BASOS ABS: 0 10*3/uL (ref 0.0–0.1)
BASOS PCT: 0 %
EOS ABS: 0 10*3/uL (ref 0.0–0.7)
Eosinophils Relative: 1 %
HCT: 31.7 % — ABNORMAL LOW (ref 36.0–46.0)
Hemoglobin: 10 g/dL — ABNORMAL LOW (ref 12.0–15.0)
Lymphocytes Relative: 26 %
Lymphs Abs: 0.6 10*3/uL — ABNORMAL LOW (ref 0.7–4.0)
MCH: 25.9 pg — ABNORMAL LOW (ref 26.0–34.0)
MCHC: 31.5 g/dL (ref 30.0–36.0)
MCV: 82.1 fL (ref 78.0–100.0)
MONO ABS: 0.2 10*3/uL (ref 0.1–1.0)
MONOS PCT: 9 %
NEUTROS PCT: 63 %
Neutro Abs: 1.5 10*3/uL — ABNORMAL LOW (ref 1.7–7.7)
Platelets: 165 10*3/uL (ref 150–400)
RBC: 3.86 MIL/uL — ABNORMAL LOW (ref 3.87–5.11)
RDW: 16.6 % — AB (ref 11.5–15.5)
WBC: 2.4 10*3/uL — ABNORMAL LOW (ref 4.0–10.5)

## 2015-05-03 LAB — PROTIME-INR
INR: 1.37 (ref 0.00–1.49)
PROTHROMBIN TIME: 17 s — AB (ref 11.6–15.2)

## 2015-05-03 LAB — GLUCOSE, CAPILLARY
GLUCOSE-CAPILLARY: 77 mg/dL (ref 65–99)
GLUCOSE-CAPILLARY: 67 mg/dL (ref 65–99)
GLUCOSE-CAPILLARY: 125 mg/dL — AB (ref 65–99)
Glucose-Capillary: 49 mg/dL — ABNORMAL LOW (ref 65–99)
Glucose-Capillary: 48 mg/dL — ABNORMAL LOW (ref 65–99)
Glucose-Capillary: 139 mg/dL — ABNORMAL HIGH (ref 65–99)
Glucose-Capillary: 94 mg/dL (ref 65–99)

## 2015-05-03 LAB — HEPATITIS B SURFACE ANTIGEN: HEP B S AG: NEGATIVE

## 2015-05-03 LAB — LACTIC ACID, PLASMA: LACTIC ACID, VENOUS: 0.6 mmol/L (ref 0.5–2.0)

## 2015-05-03 LAB — HEPATITIS PANEL, ACUTE
HCV Ab: >11 s/co ratio — ABNORMAL HIGH (ref 0.0–0.9)
HEP B S AG: NEGATIVE
Hep A IgM: NEGATIVE
Hep B C IgM: NEGATIVE

## 2015-05-03 LAB — HCV RNA QUANT
HCV QUANT: 1270000 [IU]/mL (ref >50)
HCV Quantitative Log: 6.104 log10 IU/mL (ref >1.70)

## 2015-05-03 MED ORDER — SODIUM CHLORIDE 0.9 % IV SOLN
INTRAVENOUS | Status: AC | PRN
  Administered 2015-05-03: 10 mL/h via INTRAVENOUS

## 2015-05-03 MED ORDER — FENTANYL CITRATE (PF) 100 MCG/2ML IJ SOLN
INTRAMUSCULAR | Status: AC | PRN
  Administered 2015-05-03: 25 ug via INTRAVENOUS

## 2015-05-03 MED ORDER — FENTANYL CITRATE (PF) 100 MCG/2ML IJ SOLN
INTRAMUSCULAR | Status: AC
Start: 2015-05-03 — End: 2015-05-04
  Administered 2015-05-04: 50 ug via INTRAVENOUS
  Filled 2015-05-03: qty 4

## 2015-05-03 MED ORDER — DEXTROSE 50 % IV SOLN
INTRAVENOUS | Status: AC
  Administered 2015-05-03: 50 mL
  Filled 2015-05-03: qty 50

## 2015-05-03 MED ORDER — DARBEPOETIN ALFA 100 MCG/0.5ML IJ SOSY
PREFILLED_SYRINGE | INTRAMUSCULAR | Status: AC
  Filled 2015-05-03: qty 0.5

## 2015-05-03 MED ORDER — MIDAZOLAM HCL 2 MG/2ML IJ SOLN
INTRAMUSCULAR | Status: AC | PRN
  Administered 2015-05-03: 1 mg via INTRAVENOUS

## 2015-05-03 MED ORDER — GELATIN ABSORBABLE 12-7 MM EX MISC
CUTANEOUS | Status: AC
  Filled 2015-05-03: qty 1

## 2015-05-03 MED ORDER — INSULIN ASPART 100 UNIT/ML ~~LOC~~ SOLN
0.0000 [IU] | Freq: Three times a day (TID) | SUBCUTANEOUS | Status: DC
  Administered 2015-05-04: 1 [IU] via SUBCUTANEOUS
  Administered 2015-05-04: 2 [IU] via SUBCUTANEOUS
  Administered 2015-05-05: 7 [IU] via SUBCUTANEOUS

## 2015-05-03 MED ORDER — DEXTROSE-NACL 5-0.45 % IV SOLN
INTRAVENOUS | Status: DC
  Administered 2015-05-05: 04:00:00 via INTRAVENOUS

## 2015-05-03 MED ORDER — HEPARIN SODIUM (PORCINE) 1000 UNIT/ML IJ SOLN
INTRAMUSCULAR | Status: AC
  Filled 2015-05-03: qty 1

## 2015-05-03 MED ORDER — LIDOCAINE HCL 1 % IJ SOLN
INTRAMUSCULAR | Status: AC
  Filled 2015-05-03: qty 20

## 2015-05-03 MED ORDER — DIPHENHYDRAMINE HCL 25 MG PO CAPS
25.0000 mg | ORAL_CAPSULE | Freq: Four times a day (QID) | ORAL | Status: DC | PRN
  Administered 2015-05-07: 25 mg via ORAL
  Filled 2015-05-03: qty 1

## 2015-05-03 MED ORDER — HYDROMORPHONE HCL 1 MG/ML IJ SOLN
INTRAMUSCULAR | Status: AC
  Administered 2015-05-03: 1 mg via INTRAVENOUS
  Filled 2015-05-03: qty 1

## 2015-05-03 MED ORDER — INSULIN ASPART 100 UNIT/ML ~~LOC~~ SOLN: 0.0000 [IU] | SUBCUTANEOUS | Status: DC

## 2015-05-03 MED ORDER — MIDAZOLAM HCL 2 MG/2ML IJ SOLN
INTRAMUSCULAR | Status: AC
  Filled 2015-05-03: qty 4

## 2015-05-03 MED ORDER — VANCOMYCIN HCL IN DEXTROSE 1-5 GM/200ML-% IV SOLN
1000.0000 mg | INTRAVENOUS | Status: AC
  Administered 2015-05-03: 1000 mg via INTRAVENOUS
  Filled 2015-05-03: qty 200

## 2015-05-03 NOTE — Sedation Documentation (Signed)
o2 d/c'd 

## 2015-05-03 NOTE — Sedation Documentation (Signed)
D/c'd co2 monitor due to inaccuracy of pick up due to mouth and lip shape.  Resp easy and non labored NAD.

## 2015-05-03 NOTE — Progress Notes (Signed)
Patient rates pain 9/10 in her right arm. RN administered 1mg  of Hydromorphone (Dilaudid). A few minutes later patient asks for pain medication and insisted that RN did not administer medication. Patient asked to speak to charge nurse. Charge nurse at bedside along with RN. House coverage notified. RN will continue to monitor patient.   Ermalinda Memos, RN

## 2015-05-03 NOTE — Sedation Documentation (Signed)
Poor co2, pt keeping lips closed tightly.

## 2015-05-03 NOTE — Procedures (Signed)
Placement of right jugular HD tunneled catheter. Tip in right atrium and ready to use.  No immediate complication.  Minimal blood loss.

## 2015-05-03 NOTE — Progress Notes (Signed)
05/03/2015 3:59 PM Hemodialysis Outpatient Note; wrong address in hospital record for this patient. Patient states she lives at [facility] , closest to our [place] dialysis unit. Appropriate persons have been notified and paperwork has been forwarded. We will advise when a schedule has been confirmed. Thank you. Gordy Savers

## 2015-05-03 NOTE — Progress Notes (Signed)
Informed by RN that pt is refusing to wear tele.

## 2015-05-03 NOTE — Progress Notes (Signed)
Nutrition Consult/Brief Note  RD consulted for renal diet education.  Pt c/o headache.  Declining review of education materials at this time.  Left Choose-A-Meal Booklet from [place] on pt tray table.  RD to re-attempt education at later date.  Arthur Holms, RD, LDN Pager #: [phone] After-Hours Pager #: [phone]

## 2015-05-03 NOTE — Progress Notes (Signed)
S: CO RUQ pain O:BP 121/84 mmHg  Pulse 62  Temp(Src) 94.4 F (34.7 C) (Rectal)  Resp 12  Ht 5\' 4"  (1.626 m)  Wt 78.5 kg (173 lb 1 oz)  BMI 29.69 kg/m2  SpO2 99%  Intake/Output Summary (Last 24 hours) at 05/03/15 0745 Last data filed at 05/02/15 2300  Gross per 24 hour  Intake    400 ml  Output    450 ml  Net    -50 ml   Weight change:  [id number] and alert CVS:RRR Resp:clear Abd:+bs soft, mild RUQ tenderness, no rebound Ext: tr edema NEURO:CNI Ox3 no asterixis   . antiseptic oral rinse  7 mL Mouth Rinse q12n4p  . carvedilol  25 mg Oral BID WC  . chlorhexidine  15 mL Mouth Rinse BID  . cloNIDine  0.1 mg Oral TID  . darbepoetin (ARANESP) injection - DIALYSIS  100 mcg Intravenous Q Tue-HD  . folic acid  0.5 mg Oral Daily  . hydrALAZINE  10 mg Oral 3 times per day  . Influenza vac split quadrivalent PF  0.5 mL Intramuscular Tomorrow-1000  . insulin aspart  0-9 Units Subcutaneous TID WC  . insulin glargine  36 Units Subcutaneous QHS  . levETIRAcetam  500 mg Oral Q12H  . multivitamin  1 tablet Oral QHS   Nm Hepato W/eject Fract  05/02/2015   CLINICAL DATA:  Right upper quadrant pain, cholelithiasis  EXAM: NUCLEAR MEDICINE HEPATOBILIARY IMAGING  TECHNIQUE: Sequential images of the abdomen were obtained out to 60 minutes following intravenous administration of radiopharmaceutical.  RADIOPHARMACEUTICALS:  Five mCi Tc-17m  Choletec IV  COMPARISON:  05/01/2015  FINDINGS: There is normal uptake throughout the liver immediately following injection. Visualization of the biliary tree is noted at 20 minutes with common bile duct activity at 20 minutes as well. Small bowel activity is noted at 25 minutes. The gallbladder is visualized at 70 minutes with progressive filling over the next 45 minutes.  IMPRESSION: Normal uptake and excretion of biliary tracer.  Right upper Select proper NMHepatobiliary template.   Electronically Signed   By: Inez Catalina M.D.   On: 05/02/2015 16:14   US Abdomen  Limited Ruq  05/01/2015   CLINICAL DATA:  Right upper quadrant abdominal pain. Nausea and vomiting for 5 days. Hepatitis. Chronic kidney disease.  EXAM: US ABDOMEN LIMITED - RIGHT UPPER QUADRANT  COMPARISON:  Renal ultrasound of 11/09/2014.  FINDINGS: Gallbladder:  Multiple gallstones, measuring up to 9 mm. Wall thickening, up to 5 mm. No pericholecystic edema. Murphy's sign not well evaluated; patient pre-medicated.  Common bile duct:  Diameter: Normal, 4 mm.  Liver:  Mildly heterogeneous hepatic echotexture, nonspecific.  Incidental note is made of renal hyperechogenicity.  IMPRESSION: 1. Gallstones with nonspecific gallbladder wall thickening. Sonographic Percell Miller sign could not the evaluated secondary to patient being pre-medicated. Acute cholecystitis cannot be entirely excluded. CT or nuclear medicine scintigraphy could be informative. 2. Renal echogenicity, suggesting medical renal disease.   Electronically Signed   By: Abigail Miyamoto M.D.   On: 05/01/2015 12:57   BMET    Component Value Date/Time   NA 131* 05/02/2015 0330   K 3.4* 05/02/2015 0330   CL 108 05/02/2015 0330   CO2 11* 05/02/2015 0330   GLUCOSE 135* 05/02/2015 0330   BUN 61* 05/02/2015 0330   CREATININE 8.12* 05/02/2015 0330   CALCIUM 7.6* 05/02/2015 0330   GFRNONAA 5* 05/02/2015 0330   GFRAA 6* 05/02/2015 0330   CBC    Component Value Date/Time  WBC 2.4* 05/03/2015 0430   WBC 5.0 07/20/2013 1456   RBC 3.86* 05/03/2015 0430   RBC 2.26* 09/30/2014 1324   RBC 3.18* 07/20/2013 1456   HGB 10.0* 05/03/2015 0430   HGB 9.3* 07/20/2013 1456   HCT 31.7* 05/03/2015 0430   HCT 28.8* 07/20/2013 1456   PLT 165 05/03/2015 0430   PLT 271 07/20/2013 1456   MCV 82.1 05/03/2015 0430   MCV 90.5 07/20/2013 1456   MCH 25.9* 05/03/2015 0430   MCH 29.4 07/20/2013 1456   MCHC 31.5 05/03/2015 0430   MCHC 32.4 07/20/2013 1456   RDW 16.6* 05/03/2015 0430   RDW 15.8* 07/20/2013 1456   LYMPHSABS 0.6* 05/03/2015 0430   LYMPHSABS 2.3  07/20/2013 1456   MONOABS 0.2 05/03/2015 0430   MONOABS 0.3 07/20/2013 1456   EOSABS 0.0 05/03/2015 0430   EOSABS 0.1 07/20/2013 1456   BASOSABS 0.0 05/03/2015 0430   BASOSABS 0.0 07/20/2013 1456     Assessment: 1. New ESRD sec DM/HTN 2. Anemia on aranesp 3. Sec HPTH 4. DM 5. HTN 6. Hep C  Quantitative RNA pend 7. Gallstones  HIDA Ok 8. Jehovah's Witness 9. Bipolar  Plan: 1.  Perm cath today then HD 2.  Perm access tomorrow 3. Arranging outpt schedule   MATTINGLY,MICHAEL T

## 2015-05-03 NOTE — Sedation Documentation (Signed)
Resting quietly, NAD

## 2015-05-03 NOTE — Progress Notes (Signed)
[place] TEAM 1 - Stepdown/ICU TEAM PROGRESS NOTE  LUSERO THIBODEAUX [id number] DOB: 09/29/1965 DOA: 05/01/2015 PCP: [name], MD  Admit HPI / Brief Narrative: 49 year old F Hx Bipolar DO, Schizoaffective DO, CKD Stage V (refused dialysis) with associated Anemia of Chronic Dz, Uncontrolled DM Type 2, HTN, hypertension, and Hepatitis C, who presented to [facility] emergency department with worsening right upper quadrant abdominal pain, constant and throbbing, 7 out of 10 in severity, radiating to entire abdominal area, worse with eating and with no specific alleviating factors.   In emergency department, blood pressure noted to be 240/106, WBC 3.0, Hg 11.3, sodium 126, bicarbonate 11, creatinine 8.23, glucose 422. Imaging studies notable for gallstones and nonspecific gallbladder wall thickening.   HPI/Subjective: The pt is upset because she is NPO.  She denies abdom pain, n/v, or cp today.  She is anxious to eat.    Assessment/Plan:  Acute ESRD on CKD stage 5 due to DM + HTN  -Nephrology following - to begin HD as soon as HD cath placed by IR (has been delayed today apparently)   RUQ abdominal pain -Resolved -non specific findings on abd [place] - HIDA scan unremarkable  -Gen Surgery signing off - no indication for GB procedure / surgical intervention at this time   Diabetes mellitus with nephropathy -some hypoglycemia today due to NPO status - follow w/ gently dextrose IVF   Hypertensive urgency -home meds resolved (which pt likely does not take) -resolved  Acute hyponatremia -Na improving - follow trend - likely due to decompensated CHF w/ ESRD  Chronic combined systolic and diastolic CHF (congestive heart failure) volume control per HD (to begin today)  Hypokalemia -K+ is in a safe range - no further replacement in this pt to start HD today   Leukopenia -chronic and at baseline   Hep C  Bipolar disorder -stable mental status   Anemia of chronic kidney  failure -No signs of active bleeding - Hgb stable   Code Status: FULL Family Communication: no family present at time of exam Disposition Plan: stable for transfer to renal unit   Consultants: Nephrology  Gen Surgery  Procedures:  Antibiotics: none  DVT prophylaxis: SCDs  Objective: Blood pressure 131/68, pulse 65, temperature 98.4 F (36.9 C), temperature source Oral, resp. rate 18, height 5\' 4"  (1.626 m), weight 78.5 kg (173 lb 1 oz), SpO2 100 %.  Intake/Output Summary (Last 24 hours) at 05/03/15 1558 Last data filed at 05/03/15 1300  Gross per 24 hour  Intake    400 ml  Output    550 ml  Net   -150 ml    Exam: General: No acute respiratory distress Lungs: Clear to auscultation bilaterally without wheezes or crackles Cardiovascular: Regular rate and rhythm without murmur gallop or rub normal S1 and S2 Abdomen: Nontender, nondistended, soft, bowel sounds positive, no rebound, no ascites, no appreciable mass Extremities: No significant cyanosis, clubbing, or edema bilateral lower extremities  Data Reviewed: Basic Metabolic Panel:  Recent Labs Lab 05/01/15 1112 05/01/15 1647 05/02/15 0330 05/02/15 0957  NA 126* 128* 131*  --   K 3.5 3.4* 3.4*  --   CL 105 105 108  --   CO2 11* 13* 11*  --   GLUCOSE 422* 388* 135*  --   BUN 69* 65* 61*  --   CREATININE 8.23* 7.78* 8.12*  --   CALCIUM 8.3* 8.4* 7.6*  --   PHOS  --   --   --  4.7*  CBC:  Recent Labs Lab 05/01/15 1112 05/02/15 0330 05/03/15 0430  WBC 3.0* 2.9* 2.4*  NEUTROABS 2.2  --  1.5*  HGB 11.3* 9.4* 10.0*  HCT 34.1* 28.6* 31.7*  MCV 82.0 82.7 82.1  PLT 176 160 165    Liver Function Tests:  Recent Labs Lab 05/01/15 1112 05/02/15 0330  AST 47* 36  ALT 64* 47  ALKPHOS 97 66  BILITOT 0.6 0.5  PROT 7.2 5.5*  ALBUMIN 2.4* 1.8*    Recent Labs Lab 05/01/15 1112  LIPASE 70*   Coags:  Recent Labs Lab 05/03/15 0908  INR 1.37   CBG:  Recent Labs Lab 05/02/15 1521  05/02/15 2025 05/03/15 0850 05/03/15 1156 05/03/15 1503  GLUCAP 65 191* 77 67 49*    Recent Results (from the past 240 hour(s))  MRSA PCR Screening     Status: None   Collection Time: 05/01/15 11:45 PM  Result Value Ref Range Status   MRSA by PCR NEGATIVE NEGATIVE Final    Comment:        The GeneXpert MRSA Assay (FDA approved for NASAL specimens only), is one component of a comprehensive MRSA colonization surveillance program. It is not intended to diagnose MRSA infection nor to guide or monitor treatment for MRSA infections.      Studies:   Recent x-ray studies have been reviewed in detail by the Attending Physician  Scheduled Meds:  Scheduled Meds: . antiseptic oral rinse  7 mL Mouth Rinse q12n4p  . carvedilol  25 mg Oral BID WC  . chlorhexidine  15 mL Mouth Rinse BID  . cloNIDine  0.1 mg Oral TID  . darbepoetin (ARANESP) injection - DIALYSIS  100 mcg Intravenous Q Tue-HD  . folic acid  0.5 mg Oral Daily  . hydrALAZINE  10 mg Oral 3 times per day  . Influenza vac split quadrivalent PF  0.5 mL Intramuscular Tomorrow-1000  . insulin aspart  0-9 Units Subcutaneous TID WC  . insulin glargine  36 Units Subcutaneous QHS  . levETIRAcetam  500 mg Oral Q12H  . multivitamin  1 tablet Oral QHS  . vancomycin  1,000 mg Intravenous to XRAY    Time spent on care of this patient: 35 mins   Shermeka Rutt T , MD   [facility] Office  [phone] Pager - Text Page per Shea Evans as per below:  On-Call/Text Page:      [email or web address]      password [email or web address]  If 7PM-7AM, please contact night-coverage [email or web address] Password [email or web address] 05/03/2015, 3:58 PM   LOS: 2 days

## 2015-05-03 NOTE — Progress Notes (Signed)
RN unable to obtain patient's temperature orally or axillary.  RN took rectal temperature and was 94.0 degrees Farenheit, [facility] text paged with this information.

## 2015-05-03 NOTE — Progress Notes (Signed)
Patient complaining of itching all over body and is requesting IV benadryl.  RN administered PRN dilaudid 1mg  IV push at 0341 for 9/10 right abdomen pain.  Patient stated pain is unchanged at this time and is rating pain 9/10 to the right abdomen area and is the same pain patient has been having per patient report.  [facility] text paged with this information.

## 2015-05-03 NOTE — Progress Notes (Signed)
HIDA scan shows no evidence of acute cholecystitis.  A cholecystectomy at this time is not warranted and may not ever need surgery.  Her abdominal pain is not likely coming frm the GB.  She has substance abuse issues.  LFTs are completely normal.  [name]. [name], MD, [place] [phone] [facility] Surgery

## 2015-05-03 NOTE — H&P (Signed)
Chief Complaint: Patient was seen in consultation today for end stage renal disease Chief Complaint  Patient presents with  . Nausea  . Emesis   at the request of Nephrology- Dr. Mercy Moore   Referring Physician(s): Dr. Mercy Moore   History of Present Illness: [name] is a 49 y.o. female who presented on 9/26 with abdominal pain, nausea and emesis with history of CKD stage 5 previously declined dialysis. Patient with worsening renal function and imaging with concern for acute cholecystitis. HIDA done negative for cholecystitis and evaluated by surgery-no intervention indicated. Nephrology has seen patient and is requesting perm catheter for initiation of HD today. She denies any chest pain or palpitations. She does admit to shortness of breath and still with RUQ abdominal pain. She denies any recent fever or chills. She denies any known complications to sedation.    Past Medical History  Diagnosis Date  . Neuropathy   . Glaucoma   . Bipolar 1 disorder   . Refusal of blood transfusions as patient is Jehovah's Witness   . Shortness of breath   . TIA (transient ischemic attack) 2010  . Neuromuscular disorder     DIABETIC NEUROPATHY  . MRSA infection ?2014    "on the back of my head; spread throughout my bloodstream"  . Bipolar disorder, unspecified 02/06/2014  . High cholesterol   . Type II diabetes mellitus     INSULIN DEPENDENT  . Hepatitis C   . Chronic pain syndrome   . Chronic kidney disease (CKD), stage III (moderate)   . Schizoaffective disorder, unspecified condition 02/08/2014  . Seizure dx'd 2014    "don't know what kind; last one was ~ 04/2014"  . Chronic combined systolic and diastolic CHF (congestive heart failure)     EF 40% by echo and 48% by stress test  . Anemia   . Hypertension   . Stroke     hx TIA  . Neuromuscular disorder     diabetic neuropathy  . Shortness of breath dyspnea   . Schizophrenia   . Seizures   . Hepatitis     hep C  . Bipolar  disorder   . Glaucoma   . Chronic pain   . Diabetes mellitus without complication     on Insulin  . Depression   . Anxiety   . Chronic kidney disease     chronic kidney stage 3 Dr Mercy Moore  . GERD (gastroesophageal reflux disease)   . History of hiatal hernia   . Headache     sinus headaches  . Pancytopenia     Past Surgical History  Procedure Laterality Date  . Amputation Left 05/21/2013    Procedure: Left Midfoot AMPUTATION;  Surgeon: Newt Minion, MD;  Location: [facility];  Service: Orthopedics;  Laterality: Left;  Left Midfoot Amputation  . Tubal ligation    . Colonoscopy with propofol N/A 06/22/2013    Procedure: COLONOSCOPY WITH PROPOFOL;  Surgeon: Jeryl Columbia, MD;  Location: [facility];  Service: Endoscopy;  Laterality: N/A;  . Abdominal hysterectomy  2014  . Tonsillectomy  1970's  . Refractive surgery Bilateral 2013  . Tubal ligation  2000  . Abdominal hysterectomy  2010  . Foot amputation Left     due diabetic neuropathy, could not feel ulcer on bottom of foot  . Eye surgery Bilateral 2010    Lasik    Allergies: Gabapentin; Gabapentin; Lyrica; Other; Penicillins; Saphris; Saphris; Tramadol; Tramadol; and Penicillins  Medications: Prior to Admission  medications   Medication Sig Start Date End Date Taking? Authorizing Provider  amLODipine (NORVASC) 10 MG tablet Take 1 tablet (10 mg total) by mouth daily. 12/13/14  Yes Arnoldo Morale, MD  Blood Glucose Monitoring Suppl (ACCU-CHEK AVIVA PLUS) W/DEVICE KIT 1 each by Does not apply route 4 (four) times daily - after meals and at bedtime. 12/13/14  Yes Arnoldo Morale, MD  carvedilol (COREG) 25 MG tablet Take 1 tablet (25 mg total) by mouth 2 (two) times daily. 06/24/14  Yes Debbe Odea, MD  cloNIDine (CATAPRES) 0.1 MG tablet Take 0.1 mg by mouth 3 (three) times daily. 01/25/15  Yes Historical Provider, MD  ferrous sulfate 325 (65 FE) MG tablet Take 1 tablet (325 mg total) by mouth daily with breakfast. 06/24/14  Yes Debbe Odea, MD  folic acid (FOLVITE) 681 MCG tablet Take 200 mcg by mouth every morning.    Yes Historical Provider, MD  furosemide (LASIX) 40 MG tablet Take 2 tablets (80 mg total) by mouth 2 (two) times daily. 08/09/14  Yes Larene Pickett, PA-C  glucose blood (ACCU-CHEK AVIVA PLUS) test strip Use three times before meals and at bedtime 12/13/14  Yes Arnoldo Morale, MD  insulin aspart (NOVOLOG) 100 UNIT/ML injection Inject 2-10 Units into the skin 3 (three) times daily before meals. SSI: 0-200 = 2 units, 201-250 = 4 units, 251-300 = 6 units, 301-350 = 8 units, 351-400 = 10 units   Yes Historical Provider, MD  insulin glargine (LANTUS) 100 UNIT/ML injection Inject 0.36 mLs (36 Units total) into the skin at bedtime. 12/13/14  Yes Arnoldo Morale, MD  lactulose (CHRONULAC) 10 GM/15ML solution Take 15 mLs (10 g total) by mouth 3 (three) times daily. 12/13/14  Yes Arnoldo Morale, MD  Lancet Devices [facility]) lancets Use as instructed 12/13/14  Yes Arnoldo Morale, MD  levETIRAcetam (KEPPRA) 500 MG tablet Take 500 mg by mouth every 12 (twelve) hours. 01/25/15  Yes Historical Provider, MD  Multiple Vitamin (MULTIVITAMIN WITH MINERALS) TABS tablet Take 1 tablet by mouth daily.   Yes Historical Provider, MD  mupirocin cream (BACTROBAN) 2 % Apply topically daily. Patient taking differently: Apply 1 application topically daily as needed (itching, skin irritation).  08/27/14  Yes Geradine Girt, DO  oxyCODONE (OXY IR/ROXICODONE) 5 MG immediate release tablet Take 5 mg by mouth every 6 (six) hours as needed for moderate pain or severe pain.  04/25/15  Yes Historical Provider, MD  pantoprazole (PROTONIX) 40 MG tablet Take 1 tablet (40 mg total) by mouth 2 (two) times daily. 11/24/14  Yes Thurnell Lose, MD  polyethylene glycol (MIRALAX / GLYCOLAX) packet Take 17 g by mouth 2 (two) times daily. Mix in 8 oz liquid and drink 08/24/14  Yes Historical Provider, MD  aspirin EC 325 MG EC tablet Take 1 tablet (325 mg total) by mouth  daily. Patient not taking: Reported on 05/01/2015 08/27/14   Geradine Girt, DO  atorvastatin (LIPITOR) 40 MG tablet Take 1 tablet (40 mg total) by mouth daily at 6 PM. Patient not taking: Reported on 12/13/2014 11/11/14   Jonetta Osgood, MD  cloNIDine (CATAPRES) 0.2 MG tablet Take 1 tablet (0.2 mg total) by mouth at bedtime. Patient not taking: Reported on 05/01/2015 12/13/14   Arnoldo Morale, MD  oxyCODONE-acetaminophen (PERCOCET) 5-325 MG per tablet Take 1 tablet by mouth every 6 (six) hours as needed for severe pain. Patient not taking: Reported on 05/01/2015 01/13/15   Mercedes Camprubi-Soms, PA-C  oxyCODONE-acetaminophen (PERCOCET/ROXICET) 5-325 MG per  tablet Take 1 tablet by mouth every 6 (six) hours as needed for moderate pain. Patient not taking: Reported on 05/01/2015 11/24/14   Thurnell Lose, MD     Family History  Problem Relation Age of Onset  . Hypertension Mother   . Hypertension Father     Social History   Social History  . Marital Status: Unknown    Spouse Name: N/A  . Number of Children: N/A  . Years of Education: N/A   Social History Main Topics  . Smoking status: Former Smoker -- 1.00 packs/day for 20 years    Types: Cigarettes    Quit date: 03/31/2015  . Smokeless tobacco: Never Used     Comment: "stopped smoking in ~ 2013; restarted last week; stopped again 06/17/2014"  . Alcohol Use: No     Comment: 05/01/15 patient denies alcohol use.  . Drug Use: Yes    Special: "Crack" cocaine, Cocaine     Comment: last smoked crack 11-07-14  . Sexual Activity: Not Currently    Birth Control/ Protection: Abstinence   Other Topics Concern  . None   Social History Narrative   ** Merged History Encounter **        Review of Systems: A 12 point ROS discussed and pertinent positives are indicated in the HPI above.  All other systems are negative.  Review of Systems  Vital Signs: BP 131/68 mmHg  Pulse 65  Temp(Src) 97.8 F (36.6 C) (Oral)  Resp 18  Ht $R'5\' 4"'sI$  (1.626  m)  Wt 173 lb 1 oz (78.5 kg)  BMI 29.69 kg/m2  SpO2 100%  Physical Exam  Constitutional: She is oriented to person, place, and time. No distress.  HENT:  Head: Normocephalic and atraumatic.  Cardiovascular: Normal rate and regular rhythm.  Exam reveals no gallop and no friction rub.   No murmur heard. Pulmonary/Chest: Effort normal and breath sounds normal. No respiratory distress. She has no wheezes. She has no rales.  Abdominal: Soft. Bowel sounds are normal.  Musculoskeletal: She exhibits no edema.  Neurological: She is alert and oriented to person, place, and time.  Tired  Skin: Skin is warm and dry. She is not diaphoretic.    Mallampati Score:  MD Evaluation Airway: WNL Heart: WNL Abdomen: WNL Chest/ Lungs: WNL ASA  Classification: 3 Mallampati/Airway Score: Two  Imaging: Nm Hepato W/eject Fract  05/02/2015   CLINICAL DATA:  Right upper quadrant pain, cholelithiasis  EXAM: NUCLEAR MEDICINE HEPATOBILIARY IMAGING  TECHNIQUE: Sequential images of the abdomen were obtained out to 60 minutes following intravenous administration of radiopharmaceutical.  RADIOPHARMACEUTICALS:  Five mCi Tc-31m  Choletec IV  COMPARISON:  05/01/2015  FINDINGS: There is normal uptake throughout the liver immediately following injection. Visualization of the biliary tree is noted at 20 minutes with common bile duct activity at 20 minutes as well. Small bowel activity is noted at 25 minutes. The gallbladder is visualized at 70 minutes with progressive filling over the next 45 minutes.  IMPRESSION: Normal uptake and excretion of biliary tracer.  Right upper Select proper NMHepatobiliary template.   Electronically Signed   By: Inez Catalina M.D.   On: 05/02/2015 16:14   US Abdomen Limited Ruq  05/01/2015   CLINICAL DATA:  Right upper quadrant abdominal pain. Nausea and vomiting for 5 days. Hepatitis. Chronic kidney disease.  EXAM: US ABDOMEN LIMITED - RIGHT UPPER QUADRANT  COMPARISON:  Renal ultrasound of  11/09/2014.  FINDINGS: Gallbladder:  Multiple gallstones, measuring up to 9 mm. Wall thickening,  up to 5 mm. No pericholecystic edema. Murphy's sign not well evaluated; patient pre-medicated.  Common bile duct:  Diameter: Normal, 4 mm.  Liver:  Mildly heterogeneous hepatic echotexture, nonspecific.  Incidental note is made of renal hyperechogenicity.  IMPRESSION: 1. Gallstones with nonspecific gallbladder wall thickening. Sonographic Percell Miller sign could not the evaluated secondary to patient being pre-medicated. Acute cholecystitis cannot be entirely excluded. CT or nuclear medicine scintigraphy could be informative. 2. Renal echogenicity, suggesting medical renal disease.   Electronically Signed   By: Abigail Miyamoto M.D.   On: 05/01/2015 12:57    Labs:  CBC:  Recent Labs  01/13/15 1433  04/06/15 1010 05/01/15 1112 05/02/15 0330 05/03/15 0430  WBC 2.3*  --   --  3.0* 2.9* 2.4*  HGB 7.3*  < > 9.0* 11.3* 9.4* 10.0*  HCT 22.7*  --   --  34.1* 28.6* 31.7*  PLT 158  --   --  176 160 165  < > = values in this interval not displayed.  COAGS:  Recent Labs  11/09/14 0240 11/22/14 0734  INR 1.21 1.15  APTT  --  26    BMP:  Recent Labs  01/13/15 1433 05/01/15 1112 05/01/15 1647 05/02/15 0330  NA 134* 126* 128* 131*  K 4.7 3.5 3.4* 3.4*  CL 108 105 105 108  CO2 16* 11* 13* 11*  GLUCOSE 381* 422* 388* 135*  BUN 58* 69* 65* 61*  CALCIUM 7.8* 8.3* 8.4* 7.6*  CREATININE 5.59* 8.23* 7.78* 8.12*  GFRNONAA 8* 5* 5* 5*  GFRAA 9* 6* 6* 6*    LIVER FUNCTION TESTS:  Recent Labs  11/22/14 0734 11/24/14 0850 05/01/15 1112 05/02/15 0330  BILITOT 0.7 0.6 0.6 0.5  AST 67* 51* 47* 36  ALT 63* 55* 64* 47  ALKPHOS 124* 81 97 66  PROT 5.9* 6.4 7.2 5.5*  ALBUMIN 2.3* 2.5* 2.4* 1.8*    Assessment and Plan: ESRD requiring HD now Request for image guided perm catheter with sedation today  DM HTN Anemia Hepatitis C The patient has been NPO, no blood thinners taken, labs and vitals  have been reviewed. Risks and Benefits discussed with the patient including, but not limited to bleeding, infection, vascular injury, pneumothorax which may require chest tube placement, air embolism or even death All of the patient's questions were answered, patient is agreeable to proceed. Consent signed and in chart.    Thank you for this interesting consult.  I greatly enjoyed meeting JERNEE MURTAUGH and look forward to participating in their care.  A copy of this report was sent to the requesting provider on this date.  SignedHedy Jacob 05/03/2015, 8:55 AM   I spent a total of 20 Minutes in face to face in clinical consultation, greater than 50% of which was counseling/coordinating care for end stage renal disease.

## 2015-05-03 NOTE — Progress Notes (Signed)
Patient refused to wear telemetry monitor. MD notified. Patient also refused RN to turn on bed alarm. Patient educated about fall risk. Patient verbalizes understanding. RN will continue to monitor patient.   Ermalinda Memos, RN

## 2015-05-03 NOTE — Sedation Documentation (Signed)
O2 2l/[place] started 

## 2015-05-03 NOTE — Progress Notes (Signed)
HD nurse picked up patient and took to HD for dialysis. Report was given at the bedside.

## 2015-05-03 NOTE — Progress Notes (Signed)
VASCULAR SURGERY  Vein map pending. Tentatively plan left AVF/AVG tomorrow.  Deitra Mayo, MD, [place] [phone] Office: [phone]

## 2015-05-03 NOTE — Progress Notes (Signed)
Patient is refusing to wear her heart monitor leads stating they are itching her and driving her crazy and she is nervous about her procedures today.  RN text paged this information to [facility].

## 2015-05-04 ENCOUNTER — Encounter (HOSPITAL_COMMUNITY)
Admission: EM | Disposition: A | Discharge status: Home / Self Care | Payer: Self-pay | Source: Home / Self Care | Attending: Internal Medicine

## 2015-05-04 ENCOUNTER — Encounter (HOSPITAL_COMMUNITY): Payer: Self-pay

## 2015-05-04 ENCOUNTER — Encounter (HOSPITAL_COMMUNITY): Payer: Self-pay | Admitting: Certified Registered"

## 2015-05-04 ENCOUNTER — Inpatient Hospital Stay (HOSPITAL_COMMUNITY): Payer: Medicare Other | Admitting: Certified Registered"

## 2015-05-04 DIAGNOSIS — E1021 Type 1 diabetes mellitus with diabetic nephropathy: Secondary | ICD-10-CM

## 2015-05-04 DIAGNOSIS — N181 Chronic kidney disease, stage 1: Secondary | ICD-10-CM

## 2015-05-04 HISTORY — PX: AV FISTULA PLACEMENT: SHX1204

## 2015-05-04 LAB — CBC
HCT: 28.5 % — ABNORMAL LOW (ref 36.0–46.0)
Hemoglobin: 9.1 g/dL — ABNORMAL LOW (ref 12.0–15.0)
MCH: 26 pg (ref 26.0–34.0)
MCHC: 31.9 g/dL (ref 30.0–36.0)
MCV: 81.4 fL (ref 78.0–100.0)
PLATELETS: 154 10*3/uL (ref 150–400)
RBC: 3.5 MIL/uL — ABNORMAL LOW (ref 3.87–5.11)
RDW: 17 % — AB (ref 11.5–15.5)
WBC: 2.3 10*3/uL — ABNORMAL LOW (ref 4.0–10.5)

## 2015-05-04 LAB — COMPREHENSIVE METABOLIC PANEL
ALT: 55 U/L — AB (ref 14–54)
ANION GAP: 9 (ref 5–15)
AST: 50 U/L — ABNORMAL HIGH (ref 15–41)
Albumin: 2 g/dL — ABNORMAL LOW (ref 3.5–5.0)
Alkaline Phosphatase: 80 U/L (ref 38–126)
BUN: 47 mg/dL — ABNORMAL HIGH (ref 6–20)
CHLORIDE: 104 mmol/L (ref 101–111)
CO2: 18 mmol/L — ABNORMAL LOW (ref 22–32)
CREATININE: 7.64 mg/dL — AB (ref 0.44–1.00)
Calcium: 7.3 mg/dL — ABNORMAL LOW (ref 8.9–10.3)
GFR, EST AFRICAN AMERICAN: 7 mL/min — AB (ref >60)
GFR, EST NON AFRICAN AMERICAN: 6 mL/min — AB (ref >60)
Glucose, Bld: 160 mg/dL — ABNORMAL HIGH (ref 65–99)
Potassium: 3.3 mmol/L — ABNORMAL LOW (ref 3.5–5.1)
SODIUM: 131 mmol/L — AB (ref 135–145)
Total Bilirubin: 0.4 mg/dL (ref 0.3–1.2)
Total Protein: 5.9 g/dL — ABNORMAL LOW (ref 6.5–8.1)

## 2015-05-04 LAB — GLUCOSE, CAPILLARY
GLUCOSE-CAPILLARY: 165 mg/dL — AB (ref 65–99)
GLUCOSE-CAPILLARY: 149 mg/dL — AB (ref 65–99)
Glucose-Capillary: 119 mg/dL — ABNORMAL HIGH (ref 65–99)
Glucose-Capillary: 221 mg/dL — ABNORMAL HIGH (ref 65–99)

## 2015-05-04 LAB — SURGICAL PCR SCREEN
MRSA, PCR: NEGATIVE
STAPHYLOCOCCUS AUREUS: NEGATIVE

## 2015-05-04 SURGERY — ARTERIOVENOUS (AV) FISTULA CREATION
Anesthesia: General | Site: Arm Lower | Laterality: Left

## 2015-05-04 MED ORDER — MIDAZOLAM HCL 2 MG/2ML IJ SOLN
INTRAMUSCULAR | Status: AC
  Filled 2015-05-04: qty 4

## 2015-05-04 MED ORDER — 0.9 % SODIUM CHLORIDE (POUR BTL) OPTIME
TOPICAL | Status: DC | PRN
  Administered 2015-05-04: 1000 mL

## 2015-05-04 MED ORDER — HEPARIN SODIUM (PORCINE) 1000 UNIT/ML IJ SOLN
INTRAMUSCULAR | Status: DC | PRN
  Administered 2015-05-04: 6000 [IU] via INTRAVENOUS

## 2015-05-04 MED ORDER — PHENYLEPHRINE HCL 10 MG/ML IJ SOLN
10.0000 mg | INTRAVENOUS | Status: DC | PRN
  Administered 2015-05-04: 20 ug/min via INTRAVENOUS

## 2015-05-04 MED ORDER — METOPROLOL TARTRATE 1 MG/ML IV SOLN
INTRAVENOUS | Status: AC
  Filled 2015-05-04: qty 5

## 2015-05-04 MED ORDER — PROTAMINE SULFATE 10 MG/ML IV SOLN
INTRAVENOUS | Status: DC | PRN
  Administered 2015-05-04 (×2): 15 mg via INTRAVENOUS

## 2015-05-04 MED ORDER — PROPOFOL 10 MG/ML IV BOLUS
INTRAVENOUS | Status: AC
  Filled 2015-05-04: qty 20

## 2015-05-04 MED ORDER — DARBEPOETIN ALFA 100 MCG/0.5ML IJ SOSY
100.0000 ug | PREFILLED_SYRINGE | INTRAMUSCULAR | Status: DC
Start: 2015-05-10 — End: 2015-05-07

## 2015-05-04 MED ORDER — ONDANSETRON HCL 4 MG/2ML IJ SOLN
INTRAMUSCULAR | Status: DC | PRN
  Administered 2015-05-04: 4 mg via INTRAVENOUS

## 2015-05-04 MED ORDER — LIDOCAINE HCL (PF) 1 % IJ SOLN
INTRAMUSCULAR | Status: DC | PRN
  Administered 2015-05-04: 21 mL via INTRADERMAL

## 2015-05-04 MED ORDER — LIDOCAINE HCL (CARDIAC) 20 MG/ML IV SOLN
INTRAVENOUS | Status: AC
  Filled 2015-05-04: qty 5

## 2015-05-04 MED ORDER — EPHEDRINE SULFATE 50 MG/ML IJ SOLN
INTRAMUSCULAR | Status: AC
  Filled 2015-05-04: qty 1

## 2015-05-04 MED ORDER — PHENYLEPHRINE 40 MCG/ML (10ML) SYRINGE FOR IV PUSH (FOR BLOOD PRESSURE SUPPORT)
PREFILLED_SYRINGE | INTRAVENOUS | Status: AC
  Filled 2015-05-04: qty 10

## 2015-05-04 MED ORDER — FENTANYL CITRATE (PF) 100 MCG/2ML IJ SOLN: 25.0000 ug | INTRAMUSCULAR | Status: DC | PRN

## 2015-05-04 MED ORDER — PAPAVERINE HCL 30 MG/ML IJ SOLN
INTRAMUSCULAR | Status: AC
  Filled 2015-05-04: qty 2

## 2015-05-04 MED ORDER — ONDANSETRON HCL 4 MG/2ML IJ SOLN: 4.0000 mg | Freq: Once | INTRAMUSCULAR | Status: DC | PRN

## 2015-05-04 MED ORDER — OXYCODONE-ACETAMINOPHEN 5-325 MG PO TABS
ORAL_TABLET | ORAL | Status: AC
  Filled 2015-05-04: qty 2

## 2015-05-04 MED ORDER — FENTANYL CITRATE (PF) 100 MCG/2ML IJ SOLN
25.0000 ug | INTRAMUSCULAR | Status: DC | PRN
  Administered 2015-05-04 (×2): 50 ug via INTRAVENOUS

## 2015-05-04 MED ORDER — SODIUM CHLORIDE 0.9 % IJ SOLN
INTRAMUSCULAR | Status: AC
Start: 2015-05-04 — End: 2015-05-04
  Filled 2015-05-04: qty 10

## 2015-05-04 MED ORDER — PROPOFOL 10 MG/ML IV BOLUS
INTRAVENOUS | Status: DC | PRN
  Administered 2015-05-04: 130 mg via INTRAVENOUS

## 2015-05-04 MED ORDER — LIDOCAINE HCL (CARDIAC) 20 MG/ML IV SOLN
INTRAVENOUS | Status: DC | PRN
  Administered 2015-05-04: 40 mg via INTRAVENOUS

## 2015-05-04 MED ORDER — POTASSIUM CHLORIDE CRYS ER 20 MEQ PO TBCR
40.0000 meq | EXTENDED_RELEASE_TABLET | Freq: Once | ORAL | Status: AC
  Administered 2015-05-04: 40 meq via ORAL
  Filled 2015-05-04: qty 2

## 2015-05-04 MED ORDER — THROMBIN 20000 UNITS EX SOLR
CUTANEOUS | Status: AC
  Filled 2015-05-04: qty 20000

## 2015-05-04 MED ORDER — LIDOCAINE HCL (PF) 1 % IJ SOLN
INTRAMUSCULAR | Status: AC
  Filled 2015-05-04: qty 30

## 2015-05-04 MED ORDER — SODIUM CHLORIDE 0.9 % IV SOLN
INTRAVENOUS | Status: DC | PRN
  Administered 2015-05-04: 08:00:00 via INTRAVENOUS

## 2015-05-04 MED ORDER — FENTANYL CITRATE (PF) 100 MCG/2ML IJ SOLN
INTRAMUSCULAR | Status: AC
  Administered 2015-05-04: 50 ug via INTRAVENOUS
  Filled 2015-05-04: qty 2

## 2015-05-04 MED ORDER — OXYCODONE-ACETAMINOPHEN 5-325 MG PO TABS
1.0000 | ORAL_TABLET | ORAL | Status: DC | PRN
  Administered 2015-05-04 – 2015-05-05 (×3): 2 via ORAL
  Filled 2015-05-04 (×3): qty 2

## 2015-05-04 MED ORDER — FENTANYL CITRATE (PF) 250 MCG/5ML IJ SOLN
INTRAMUSCULAR | Status: AC
Start: 2015-05-04 — End: 2015-05-04
  Filled 2015-05-04: qty 5

## 2015-05-04 MED ORDER — SODIUM CHLORIDE 0.9 % IV SOLN
INTRAVENOUS | Status: DC | PRN
  Administered 2015-05-04: 07:00:00

## 2015-05-04 MED ORDER — PHENYLEPHRINE HCL 10 MG/ML IJ SOLN
INTRAMUSCULAR | Status: DC | PRN
  Administered 2015-05-04: 120 ug via INTRAVENOUS
  Administered 2015-05-04: 80 ug via INTRAVENOUS

## 2015-05-04 MED ORDER — MIDAZOLAM HCL 5 MG/5ML IJ SOLN
INTRAMUSCULAR | Status: DC | PRN
  Administered 2015-05-04: 1 mg via INTRAVENOUS

## 2015-05-04 MED ORDER — FENTANYL CITRATE (PF) 100 MCG/2ML IJ SOLN
INTRAMUSCULAR | Status: DC | PRN
  Administered 2015-05-04: 50 ug via INTRAVENOUS

## 2015-05-04 MED ORDER — SUCCINYLCHOLINE CHLORIDE 20 MG/ML IJ SOLN
INTRAMUSCULAR | Status: AC
  Filled 2015-05-04: qty 1

## 2015-05-04 SURGICAL SUPPLY — 36 items
ARMBAND PINK RESTRICT EXTREMIT (MISCELLANEOUS) ×3 IMPLANT
CANISTER SUCTION 2500CC (MISCELLANEOUS) ×3 IMPLANT
CANNULA VESSEL 3MM 2 BLNT TIP (CANNULA) ×3 IMPLANT
CLIP TI MEDIUM 6 (CLIP) ×3 IMPLANT
CLIP TI WIDE RED SMALL 6 (CLIP) ×3 IMPLANT
COVER PROBE W GEL 5X96 (DRAPES) IMPLANT
DECANTER SPIKE VIAL GLASS SM (MISCELLANEOUS) ×3 IMPLANT
ELECT REM PT RETURN 9FT ADLT (ELECTROSURGICAL) ×3
ELECTRODE REM PT RTRN 9FT ADLT (ELECTROSURGICAL) ×1 IMPLANT
GLOVE BIO SURGEON STRL SZ 6.5 (GLOVE) ×1 IMPLANT
GLOVE BIO SURGEON STRL SZ7.5 (GLOVE) ×3 IMPLANT
GLOVE BIO SURGEONS STRL SZ 6.5 (GLOVE) ×1
GLOVE BIOGEL PI IND STRL 6.5 (GLOVE) IMPLANT
GLOVE BIOGEL PI IND STRL 7.5 (GLOVE) IMPLANT
GLOVE BIOGEL PI IND STRL 8 (GLOVE) ×1 IMPLANT
GLOVE BIOGEL PI INDICATOR 6.5 (GLOVE) ×2
GLOVE BIOGEL PI INDICATOR 7.5 (GLOVE) ×2
GLOVE BIOGEL PI INDICATOR 8 (GLOVE) ×2
GLOVE ECLIPSE 6.5 STRL STRAW (GLOVE) ×2 IMPLANT
GLOVE ECLIPSE 7.0 STRL STRAW (GLOVE) ×2 IMPLANT
GOWN STRL REUS W/ TWL LRG LVL3 (GOWN DISPOSABLE) ×3 IMPLANT
GOWN STRL REUS W/TWL LRG LVL3 (GOWN DISPOSABLE) ×9
GRAFT GORETEX STRT 4-7X45 (Vascular Products) ×2 IMPLANT
KIT BASIN OR (CUSTOM PROCEDURE TRAY) ×3 IMPLANT
KIT ROOM TURNOVER OR (KITS) ×3 IMPLANT
LIQUID BAND (GAUZE/BANDAGES/DRESSINGS) ×3 IMPLANT
NS IRRIG 1000ML POUR BTL (IV SOLUTION) ×3 IMPLANT
PACK CV ACCESS (CUSTOM PROCEDURE TRAY) ×3 IMPLANT
PAD ARMBOARD 7.5X6 YLW CONV (MISCELLANEOUS) ×6 IMPLANT
SPONGE SURGIFOAM ABS GEL 100 (HEMOSTASIS) IMPLANT
SUT PROLENE 6 0 BV (SUTURE) ×7 IMPLANT
SUT VIC AB 3-0 SH 27 (SUTURE) ×6
SUT VIC AB 3-0 SH 27X BRD (SUTURE) ×1 IMPLANT
SUT VICRYL 4-0 PS2 18IN ABS (SUTURE) ×5 IMPLANT
UNDERPAD 30X30 INCONTINENT (UNDERPADS AND DIAPERS) ×3 IMPLANT
WATER STERILE IRR 1000ML POUR (IV SOLUTION) ×3 IMPLANT

## 2015-05-04 NOTE — Progress Notes (Signed)
[facility]                                                                              Patient Demographics  Pinkey [name], is a 49 y.o. female, DOB - 11/21/1965, [id number]  Admit date - 05/01/2015   Admitting Physician Theodis Blaze, MD  Outpatient Primary MD for the patient is WEBB, Valla Leaver, MD  LOS - 3   Chief Complaint  Patient presents with  . Nausea  . Emesis       Brief HPI   Patient is 49 year old female with multiple medical conditions including chronic kidney disease stage V with associated anemia of chronic disease and iron deficiency, uncontrolled diabetes mellitus, hypertension, bipolar disorder and schizoaffective disorder, hepatitis C, has refused dialysis in the past. Patient subsequently presented to [facility] emergency department on 9/26 with main concern of several days duration of progressively worsening right upper quadrant abdominal pain, constant and throbbing, 7 out of 10 in severity when present, radiating to entire abdominal area, worse with eating and with no specific alleviating factors.  No fevers or chills.  In emergency department, blood pressure noted to be significantly elevated in 200s. blood work notable for WBC 3.0, Hg 11.3, sodium 126, bicarbonate 11, creatinine 8.23, glucose 422. Imaging studies notable for gallstones and nonspecific gallbladder wall thickening. Nephrology and surgery team consulted. Patient was admitted to stepdown unit for further workup.    Assessment & Plan    Acute ESRD on CKD stage 5 due to DM + HTN  -Nephrology and interventional radiology was consulted. Patient underwent tunneled HD catheter placement on 9/28 by IR.  - Vascular surgery was consulted and patient underwent left brachial forearm AV graft placement on 9/29. - CLIP process to be started by nephrology  RUQ abdominal pain -Resolved -non specific findings on abd [place] - HIDA scan unremarkable  -Gen Surgery was consulted however  recommended that there is no indication for cholecystectomy or any surgical intervention at this time.    Diabetes mellitus with nephropathy -Continue sliding scale insulin, monitor closely for any hypoglycemia   Hypertensive urgency - BP has improved, continue clonidine, Coreg, hydralazine  Acute hyponatremia -Na improving, likely due to decompensated CHF w/ ESRD  Chronic combined systolic and diastolic CHF (congestive heart failure) volume control per HD   Hypokalemia Replace orally today   Leukopenia -chronic and at baseline   Hep C  Bipolar disorder -stable mental status   Anemia of chronic kidney failure -No signs of active bleeding - Hgb stable   History of seizures  Continue Keppra   Code Status:Full code.   Family Communication: Discussed in detail with the patient, all imaging results, lab results explained to the patient   Disposition Plan: Not medically ready  Time Spent in minutes   25 minutes  Procedures  Tunneled HD cath AV graft placement HIDA scan  Consults   General surgery  Nephrology  Vascular surgery   DVT Prophylaxis SCDs  Medications  Scheduled Meds: . antiseptic oral rinse  7 mL Mouth Rinse q12n4p  . carvedilol  25 mg Oral BID WC  . chlorhexidine  15 mL Mouth  Rinse BID  . cloNIDine  0.1 mg Oral TID  . [START ON 05/10/2015] darbepoetin (ARANESP) injection - DIALYSIS  100 mcg Intravenous Q Wed-HD  . folic acid  0.5 mg Oral Daily  . hydrALAZINE  10 mg Oral 3 times per day  . Influenza vac split quadrivalent PF  0.5 mL Intramuscular Tomorrow-1000  . insulin aspart  0-9 Units Subcutaneous TID WC  . levETIRAcetam  500 mg Oral Q12H  . multivitamin  1 tablet Oral QHS  . oxyCODONE-acetaminophen       Continuous Infusions: . dextrose 5 % and 0.45% NaCl     PRN Meds:.diphenhydrAMINE, hydrALAZINE, HYDROmorphone (DILAUDID) injection, ondansetron **OR** ondansetron (ZOFRAN) IV, oxyCODONE-acetaminophen, technetium TC 6M  mebrofenin   Antibiotics   Anti-infectives    Start     Dose/Rate Route Frequency Ordered Stop   05/03/15 1015  vancomycin (VANCOCIN) IVPB 1000 mg/200 mL premix     1,000 mg 200 mL/hr over 60 Minutes Intravenous To Radiology 05/03/15 1004 05/03/15 1807        Subjective:   [name] was seen and examined today.Patient seen after the AV graft procedure, somewhat somnolent otherwise denies any specific complaints. Patient denies dizziness, chest pain, shortness of breath, abdominal pain, N/V/D/C, new weakness, numbess, tingling. No acute events overnight.    Objective:   Blood pressure 128/66, pulse 82, temperature 98.4 F (36.9 C), temperature source Oral, resp. rate 16, height 5\' 4"  (1.626 m), weight 76 kg (167 lb 8.8 oz), SpO2 100 %.  Wt Readings from Last 3 Encounters:  05/03/15 76 kg (167 lb 8.8 oz)  04/06/15 79.379 kg (175 lb)  03/31/15 77.111 kg (170 lb)     Intake/Output Summary (Last 24 hours) at 05/04/15 1156 Last data filed at 05/04/15 0915  Gross per 24 hour  Intake    490 ml  Output   1050 ml  Net   -560 ml    Exam  General: Somewhat somnolent however easy to arouse and oriented  HEENT:  PERRLA, EOMI, Anicteric Sclera, mucous membranes moist.   Neck: Supple, no JVD, no masses  CVS: S1 S2 auscultated, no rubs, murmurs or gallops. Regular rate and rhythm.  Respiratory: Clear to auscultation bilaterally, no wheezing, rales or rhonchi  Abdomen: Soft, nontender, nondistended, + bowel sounds  Ext: no cyanosis clubbing or edema  Neuro: AAOx3, Cr N's II- XII. Strength 5/5 upper and lower extremities bilaterally  Skin: No rashes   Psych:somewhat somnolent but easy to arouse and oriented  Data Review   Micro Results Recent Results (from the past 240 hour(s))  MRSA PCR Screening     Status: None   Collection Time: 05/01/15 11:45 PM  Result Value Ref Range Status   MRSA by PCR NEGATIVE NEGATIVE Final    Comment:        The GeneXpert MRSA Assay  (FDA approved for NASAL specimens only), is one component of a comprehensive MRSA colonization surveillance program. It is not intended to diagnose MRSA infection nor to guide or monitor treatment for MRSA infections.   Surgical pcr screen     Status: None   Collection Time: 05/04/15  5:43 AM  Result Value Ref Range Status   MRSA, PCR NEGATIVE NEGATIVE Final   Staphylococcus aureus NEGATIVE NEGATIVE Final    Comment:        The Xpert SA Assay (FDA approved for NASAL specimens in patients over 4 years of age), is one component of a comprehensive surveillance program.  Test performance has been  validated by [facility] for patients greater than or equal to 24 year old. It is not intended to diagnose infection nor to guide or monitor treatment.     Radiology Reports Nm Hepato W/eject Fract  05/02/2015   CLINICAL DATA:  Right upper quadrant pain, cholelithiasis  EXAM: NUCLEAR MEDICINE HEPATOBILIARY IMAGING  TECHNIQUE: Sequential images of the abdomen were obtained out to 60 minutes following intravenous administration of radiopharmaceutical.  RADIOPHARMACEUTICALS:  Five mCi Tc-51m  Choletec IV  COMPARISON:  05/01/2015  FINDINGS: There is normal uptake throughout the liver immediately following injection. Visualization of the biliary tree is noted at 20 minutes with common bile duct activity at 20 minutes as well. Small bowel activity is noted at 25 minutes. The gallbladder is visualized at 70 minutes with progressive filling over the next 45 minutes.  IMPRESSION: Normal uptake and excretion of biliary tracer.  Right upper Select proper NMHepatobiliary template.   Electronically Signed   By: Inez Catalina M.D.   On: 05/02/2015 16:14   Ir Fluoro Guide Cv Line Right  05/03/2015   INDICATION: 49 year old with end-stage renal disease and starting hemodialysis.  EXAM: FLUOROSCOPIC AND ULTRASOUND GUIDED PLACEMENT OF A TUNNELED DIALYSIS CATHETER  Physician: Stephan Minister. Henn, MD  FLUOROSCOPY TIME:   30 seconds, 11.3 mGy  MEDICATIONS: 1 mg Versed, 25 mcg fentanyl.  1 g vancomycin.  A radiology nurse monitored the patient for moderate sedation.  Vancomycin was given within two hours of incision. Vancomycin was given due to a penicillin allergy.  ANESTHESIA/SEDATION: Moderate sedation time: 30 minutes  PROCEDURE: Informed consent was obtained for placement of a tunneled dialysis catheter. The patient was placed supine on the interventional table. Ultrasound confirmed a patent right internal jugularvein. Ultrasound images were obtained for documentation. The right side of the neck was prepped and draped in a sterile fashion. The right side of the neck neck was anesthetized with 1% lidocaine. Maximal barrier sterile technique was utilized including caps, mask, sterile gowns, sterile gloves, sterile drape, hand hygiene and skin antiseptic. A small incision was made with #11 blade scalpel. A 21 gauge needle directed into the right internal jugular vein with ultrasound guidance. A micropuncture dilator set was placed. A 19 cm tip to cuff Palindrome catheter was selected. The skin below the right clavicle was anesthetized and a small incision was made with an #11 blade scalpel. A subcutaneous tunnel was formed to the vein dermatotomy site. The catheter was brought through the tunnel. The vein dermatotomy site was dilated to accommodate a peel-away sheath. The catheter was placed through the peel-away sheath and directed into the central venous structures. The tip of the catheter was placed in the right atrium with fluoroscopy. Fluoroscopic images were obtained for documentation. Both lumens were found to aspirate and flush well. The proper amount of heparin was flushed in both lumens. The vein dermatotomy site was closed using a single layer of absorbable suture and Dermabond. Gel-Foam was placed in the subcutaneous tract. The catheter was secured to the skin using Prolene suture.  FINDINGS: Catheter tip in the right  atrium.  Estimated blood loss: Minimal  COMPLICATIONS: None  IMPRESSION: Successful placement of a right jugular tunneled dialysis catheter using ultrasound and fluoroscopic guidance.   Electronically Signed   By: Markus Daft M.D.   On: 05/03/2015 18:06   Ir US Guide Vasc Access Right  05/03/2015   INDICATION: 49 year old with end-stage renal disease and starting hemodialysis.  EXAM: FLUOROSCOPIC AND ULTRASOUND GUIDED PLACEMENT OF A TUNNELED DIALYSIS  CATHETER  Physician: Stephan Minister. Henn, MD  FLUOROSCOPY TIME:  30 seconds, 11.3 mGy  MEDICATIONS: 1 mg Versed, 25 mcg fentanyl.  1 g vancomycin.  A radiology nurse monitored the patient for moderate sedation.  Vancomycin was given within two hours of incision. Vancomycin was given due to a penicillin allergy.  ANESTHESIA/SEDATION: Moderate sedation time: 30 minutes  PROCEDURE: Informed consent was obtained for placement of a tunneled dialysis catheter. The patient was placed supine on the interventional table. Ultrasound confirmed a patent right internal jugularvein. Ultrasound images were obtained for documentation. The right side of the neck was prepped and draped in a sterile fashion. The right side of the neck neck was anesthetized with 1% lidocaine. Maximal barrier sterile technique was utilized including caps, mask, sterile gowns, sterile gloves, sterile drape, hand hygiene and skin antiseptic. A small incision was made with #11 blade scalpel. A 21 gauge needle directed into the right internal jugular vein with ultrasound guidance. A micropuncture dilator set was placed. A 19 cm tip to cuff Palindrome catheter was selected. The skin below the right clavicle was anesthetized and a small incision was made with an #11 blade scalpel. A subcutaneous tunnel was formed to the vein dermatotomy site. The catheter was brought through the tunnel. The vein dermatotomy site was dilated to accommodate a peel-away sheath. The catheter was placed through the peel-away sheath and  directed into the central venous structures. The tip of the catheter was placed in the right atrium with fluoroscopy. Fluoroscopic images were obtained for documentation. Both lumens were found to aspirate and flush well. The proper amount of heparin was flushed in both lumens. The vein dermatotomy site was closed using a single layer of absorbable suture and Dermabond. Gel-Foam was placed in the subcutaneous tract. The catheter was secured to the skin using Prolene suture.  FINDINGS: Catheter tip in the right atrium.  Estimated blood loss: Minimal  COMPLICATIONS: None  IMPRESSION: Successful placement of a right jugular tunneled dialysis catheter using ultrasound and fluoroscopic guidance.   Electronically Signed   By: Markus Daft M.D.   On: 05/03/2015 18:06   US Abdomen Limited Ruq  05/01/2015   CLINICAL DATA:  Right upper quadrant abdominal pain. Nausea and vomiting for 5 days. Hepatitis. Chronic kidney disease.  EXAM: US ABDOMEN LIMITED - RIGHT UPPER QUADRANT  COMPARISON:  Renal ultrasound of 11/09/2014.  FINDINGS: Gallbladder:  Multiple gallstones, measuring up to 9 mm. Wall thickening, up to 5 mm. No pericholecystic edema. Murphy's sign not well evaluated; patient pre-medicated.  Common bile duct:  Diameter: Normal, 4 mm.  Liver:  Mildly heterogeneous hepatic echotexture, nonspecific.  Incidental note is made of renal hyperechogenicity.  IMPRESSION: 1. Gallstones with nonspecific gallbladder wall thickening. Sonographic Percell Miller sign could not the evaluated secondary to patient being pre-medicated. Acute cholecystitis cannot be entirely excluded. CT or nuclear medicine scintigraphy could be informative. 2. Renal echogenicity, suggesting medical renal disease.   Electronically Signed   By: Abigail Miyamoto M.D.   On: 05/01/2015 12:57    CBC  Recent Labs Lab 05/01/15 1112 05/02/15 0330 05/03/15 0430 05/04/15 0424  WBC 3.0* 2.9* 2.4* 2.3*  HGB 11.3* 9.4* 10.0* 9.1*  HCT 34.1* 28.6* 31.7* 28.5*  PLT 176  160 165 154  MCV 82.0 82.7 82.1 81.4  MCH 27.2 27.2 25.9* 26.0  MCHC 33.1 32.9 31.5 31.9  RDW 16.2* 16.4* 16.6* 17.0*  LYMPHSABS 0.5*  --  0.6*  --   MONOABS 0.3  --  0.2  --  EOSABS 0.0  --  0.0  --   BASOSABS 0.0  --  0.0  --     Chemistries   Recent Labs Lab 05/01/15 1112 05/01/15 1647 05/02/15 0330 05/04/15 0424  NA 126* 128* 131* 131*  K 3.5 3.4* 3.4* 3.3*  CL 105 105 108 104  CO2 11* 13* 11* 18*  GLUCOSE 422* 388* 135* 160*  BUN 69* 65* 61* 47*  CREATININE 8.23* 7.78* 8.12* 7.64*  CALCIUM 8.3* 8.4* 7.6* 7.3*  AST 47*  --  36 50*  ALT 64*  --  47 55*  ALKPHOS 97  --  66 80  BILITOT 0.6  --  0.5 0.4   ------------------------------------------------------------------------------------------------------------------ estimated creatinine clearance is 9 mL/min (by C-G formula based on Cr of 7.64). ------------------------------------------------------------------------------------------------------------------ No results for input(s): HGBA1C in the last 72 hours. ------------------------------------------------------------------------------------------------------------------ No results for input(s): CHOL, HDL, LDLCALC, TRIG, CHOLHDL, LDLDIRECT in the last 72 hours. ------------------------------------------------------------------------------------------------------------------ No results for input(s): TSH, T4TOTAL, T3FREE, THYROIDAB in the last 72 hours.  Invalid input(s): FREET3 ------------------------------------------------------------------------------------------------------------------  Recent Labs  05/02/15 0957  FERRITIN 316*  TIBC 168*  IRON 40    Coagulation profile  Recent Labs Lab 05/03/15 0908  INR 1.37    No results for input(s): DDIMER in the last 72 hours.  Cardiac Enzymes No results for input(s): CKMB, TROPONINI, MYOGLOBIN in the last 168 hours.  Invalid input(s):  CK ------------------------------------------------------------------------------------------------------------------ Invalid input(s): [place]  05/03/15 1503 05/03/15 1636 05/03/15 1812 05/03/15 2100 05/03/15 2241 05/04/15 0923  GLUCAP 49* 48* 139* 94 125* 119*     [name] M.D. [facility] 05/04/2015, 11:56 AM  Pager: [phone] Between 7am to 7pm - call Pager - [phone]  After 7pm go to [email or web address] - password [email or web address]  Call night coverage person covering after 7pm

## 2015-05-04 NOTE — H&P (View-Only) (Signed)
VASCULAR SURGERY  Vein map pending. Tentatively plan left AVF/AVG tomorrow.  Deitra Mayo, MD, [place] [phone] Office: [phone]

## 2015-05-04 NOTE — Interval H&P Note (Signed)
History and Physical Interval Note:  05/04/2015 7:21 AM  [name]  has presented today for surgery, with the diagnosis of Stage V Chronic Kidney Disease N18.5  The various methods of treatment have been discussed with the patient and family. After consideration of risks, benefits and other options for treatment, the patient has consented to  Procedure(s): ARTERIOVENOUS (AV) FISTULA CREATION VERSUS GRAFT INSERTION (Left) as a surgical intervention .  The patient's history has been reviewed, patient examined, no change in status, stable for surgery.  I have reviewed the patient's chart and labs.  Questions were answered to the patient's satisfaction.     Deitra Mayo

## 2015-05-04 NOTE — Procedures (Signed)
I was present at this session.  I have reviewed the session itself and made appropriate changes.  HD via RIJ PC. bp 110-130.  [id number]. tol well.    DETERDING,JAMES L 9/29/20163:28 PM

## 2015-05-04 NOTE — Op Note (Signed)
    NAME: [name]    MRN: [id number] DOB: [birth year]    DATE OF OPERATION: 05/04/2015  PREOP DIAGNOSIS: Stage V chronic kidney disease  POSTOP DIAGNOSIS: Same  PROCEDURE: Left brachial brachial forearm AV graft (4-7 mm PTFE graft)  SURGEON: Judeth Cornfield. Scot Dock, MD, FACS  ASSIST: Gerri Lins  ANESTHESIA: Gen.   EBL: minimal  INDICATIONS: [name] is a 49 y.o. female who presents for new access. The basilic and cephalic veins did not appear adequate by ultrasound.  FINDINGS: 4 mm brachial vein.  TECHNIQUE: The patient was taken to the operating room and received a general anesthetic. The left upper extremity was prepped and draped in usual sterile fashion. I looked at the forearm, upper arm cephalic veins and the basilic vein with the SonoSite. The veins did not appear adequate. Therefore elected to place an AV graft. A longitudinal incision was made just above the antecubital level. Here the brachial artery and adjacent brachial vein were dissected free and controlled with vessel loops. Using one distal counterincision, a 4-7 mm PTFE graft was tunneled in a loop fashion in the forearm with the venous aspect of the graft along the medial aspect of the forearm in the arterial aspect of the graft along the lateral aspect of the forearm. The patient was heparinized. The brachial artery was clamped proximally and distally and a longitudinal arteriotomy was made. A short segment of the 4 mm end of the graft was excised, the graft slightly spatulated, and sewn end to side to the artery using continuous 60 proline suture. Graft and poorly appropriate length for anastomosis to the brachial vein. The vein was ligated distally and spatulated proximally. The graft was cut the appropriate length, spatulated and sewn end-to-end to the vein using continuous 6-0 proline suture. At the completion was a good thrill in the graft. Heparin was partially reversed with protamine. The wounds were closed  with a deep layer of 3-0 Vicryl and the skin closed with 4-0 Vicryl. Liquid band was applied. The patient tolerated the procedure well and was transferred to the recovery room in stable condition. All needle and sponge counts were correct.  Deitra Mayo, MD, FACS [facility]  DATE OF DICTATION:   05/04/2015

## 2015-05-04 NOTE — Transfer of Care (Signed)
Immediate Anesthesia Transfer of Care Note  Patient: [name]  Procedure(s) Performed: Procedure(s): ARTERIOVENOUS (AV) GRAFT INSERTION (Left)  Patient Location: PACU  Anesthesia Type:General  Level of Consciousness: lethargic and responds to stimulation  Airway & Oxygen Therapy: Patient Spontanous Breathing  Post-op Assessment: Report given to RN  Post vital signs: Reviewed and stable  Last Vitals:  Filed Vitals:   05/04/15 0541  BP: 141/66  Pulse: 80  Temp: 36.7 C  Resp: 18    Complications: No apparent anesthesia complications

## 2015-05-04 NOTE — Progress Notes (Signed)
Patient arrived on unit via bed with dialysis RN. Patient alert and oriented x4. Patient oriented to room, staff, and unit. Patient refused to be placed on telemetry, MD notified. Skin assessment completed with two RN's. Check flowsheet. Patient's IV clean, dry and infusing. Patient rates pain 9/10. Pain medication administered. Safety Fall Prevention Plan was given, discussed and patient refused to have bed alarm on. Orders have been reviewed and implemented. Call light has been placed within reach. RN will continue to monitor the patient.   Nena Polio BSN, RN  Phone Number: [phone]

## 2015-05-04 NOTE — Progress Notes (Signed)
S: Some pain Lt arm O:BP 128/66 mmHg  Pulse 82  Temp(Src) 98.4 F (36.9 C) (Oral)  Resp 16  Ht 5\' 4"  (1.626 m)  Wt 76 kg (167 lb 8.8 oz)  BMI 28.75 kg/m2  SpO2 100%  Intake/Output Summary (Last 24 hours) at 05/04/15 1208 Last data filed at 05/04/15 0915  Gross per 24 hour  Intake    490 ml  Output   1050 ml  Net   -560 ml   Weight change:  [id number] and alert CVS:RRR Resp:clear Abd:+bs soft, mild RUQ tenderness, no rebound Ext: tr edema  Lt AVG + bruit NEURO:CNI Ox3 no asterixis Rt IJ PC   . antiseptic oral rinse  7 mL Mouth Rinse q12n4p  . carvedilol  25 mg Oral BID WC  . chlorhexidine  15 mL Mouth Rinse BID  . cloNIDine  0.1 mg Oral TID  . [START ON 05/10/2015] darbepoetin (ARANESP) injection - DIALYSIS  100 mcg Intravenous Q Wed-HD  . folic acid  0.5 mg Oral Daily  . hydrALAZINE  10 mg Oral 3 times per day  . Influenza vac split quadrivalent PF  0.5 mL Intramuscular Tomorrow-1000  . insulin aspart  0-9 Units Subcutaneous TID WC  . levETIRAcetam  500 mg Oral Q12H  . multivitamin  1 tablet Oral QHS  . oxyCODONE-acetaminophen      . potassium chloride  40 mEq Oral Once   Nm Hepato W/eject Fract  05/02/2015   CLINICAL DATA:  Right upper quadrant pain, cholelithiasis  EXAM: NUCLEAR MEDICINE HEPATOBILIARY IMAGING  TECHNIQUE: Sequential images of the abdomen were obtained out to 60 minutes following intravenous administration of radiopharmaceutical.  RADIOPHARMACEUTICALS:  Five mCi Tc-34m  Choletec IV  COMPARISON:  05/01/2015  FINDINGS: There is normal uptake throughout the liver immediately following injection. Visualization of the biliary tree is noted at 20 minutes with common bile duct activity at 20 minutes as well. Small bowel activity is noted at 25 minutes. The gallbladder is visualized at 70 minutes with progressive filling over the next 45 minutes.  IMPRESSION: Normal uptake and excretion of biliary tracer.  Right upper Select proper NMHepatobiliary template.    Electronically Signed   By: Inez Catalina M.D.   On: 05/02/2015 16:14   Ir Fluoro Guide Cv Line Right  05/03/2015   INDICATION: 49 year old with end-stage renal disease and starting hemodialysis.  EXAM: FLUOROSCOPIC AND ULTRASOUND GUIDED PLACEMENT OF A TUNNELED DIALYSIS CATHETER  Physician: Stephan Minister. Henn, MD  FLUOROSCOPY TIME:  30 seconds, 11.3 mGy  MEDICATIONS: 1 mg Versed, 25 mcg fentanyl.  1 g vancomycin.  A radiology nurse monitored the patient for moderate sedation.  Vancomycin was given within two hours of incision. Vancomycin was given due to a penicillin allergy.  ANESTHESIA/SEDATION: Moderate sedation time: 30 minutes  PROCEDURE: Informed consent was obtained for placement of a tunneled dialysis catheter. The patient was placed supine on the interventional table. Ultrasound confirmed a patent right internal jugularvein. Ultrasound images were obtained for documentation. The right side of the neck was prepped and draped in a sterile fashion. The right side of the neck neck was anesthetized with 1% lidocaine. Maximal barrier sterile technique was utilized including caps, mask, sterile gowns, sterile gloves, sterile drape, hand hygiene and skin antiseptic. A small incision was made with #11 blade scalpel. A 21 gauge needle directed into the right internal jugular vein with ultrasound guidance. A micropuncture dilator set was placed. A 19 cm tip to cuff Palindrome catheter was selected. The skin below  the right clavicle was anesthetized and a small incision was made with an #11 blade scalpel. A subcutaneous tunnel was formed to the vein dermatotomy site. The catheter was brought through the tunnel. The vein dermatotomy site was dilated to accommodate a peel-away sheath. The catheter was placed through the peel-away sheath and directed into the central venous structures. The tip of the catheter was placed in the right atrium with fluoroscopy. Fluoroscopic images were obtained for documentation. Both lumens were  found to aspirate and flush well. The proper amount of heparin was flushed in both lumens. The vein dermatotomy site was closed using a single layer of absorbable suture and Dermabond. Gel-Foam was placed in the subcutaneous tract. The catheter was secured to the skin using Prolene suture.  FINDINGS: Catheter tip in the right atrium.  Estimated blood loss: Minimal  COMPLICATIONS: None  IMPRESSION: Successful placement of a right jugular tunneled dialysis catheter using ultrasound and fluoroscopic guidance.   Electronically Signed   By: Markus Daft M.D.   On: 05/03/2015 18:06   Ir US Guide Vasc Access Right  05/03/2015   INDICATION: 49 year old with end-stage renal disease and starting hemodialysis.  EXAM: FLUOROSCOPIC AND ULTRASOUND GUIDED PLACEMENT OF A TUNNELED DIALYSIS CATHETER  Physician: Stephan Minister. Henn, MD  FLUOROSCOPY TIME:  30 seconds, 11.3 mGy  MEDICATIONS: 1 mg Versed, 25 mcg fentanyl.  1 g vancomycin.  A radiology nurse monitored the patient for moderate sedation.  Vancomycin was given within two hours of incision. Vancomycin was given due to a penicillin allergy.  ANESTHESIA/SEDATION: Moderate sedation time: 30 minutes  PROCEDURE: Informed consent was obtained for placement of a tunneled dialysis catheter. The patient was placed supine on the interventional table. Ultrasound confirmed a patent right internal jugularvein. Ultrasound images were obtained for documentation. The right side of the neck was prepped and draped in a sterile fashion. The right side of the neck neck was anesthetized with 1% lidocaine. Maximal barrier sterile technique was utilized including caps, mask, sterile gowns, sterile gloves, sterile drape, hand hygiene and skin antiseptic. A small incision was made with #11 blade scalpel. A 21 gauge needle directed into the right internal jugular vein with ultrasound guidance. A micropuncture dilator set was placed. A 19 cm tip to cuff Palindrome catheter was selected. The skin below the  right clavicle was anesthetized and a small incision was made with an #11 blade scalpel. A subcutaneous tunnel was formed to the vein dermatotomy site. The catheter was brought through the tunnel. The vein dermatotomy site was dilated to accommodate a peel-away sheath. The catheter was placed through the peel-away sheath and directed into the central venous structures. The tip of the catheter was placed in the right atrium with fluoroscopy. Fluoroscopic images were obtained for documentation. Both lumens were found to aspirate and flush well. The proper amount of heparin was flushed in both lumens. The vein dermatotomy site was closed using a single layer of absorbable suture and Dermabond. Gel-Foam was placed in the subcutaneous tract. The catheter was secured to the skin using Prolene suture.  FINDINGS: Catheter tip in the right atrium.  Estimated blood loss: Minimal  COMPLICATIONS: None  IMPRESSION: Successful placement of a right jugular tunneled dialysis catheter using ultrasound and fluoroscopic guidance.   Electronically Signed   By: Markus Daft M.D.   On: 05/03/2015 18:06   BMET    Component Value Date/Time   NA 131* 05/04/2015 0424   K 3.3* 05/04/2015 0424   CL 104 05/04/2015 0424  CO2 18* 05/04/2015 0424   GLUCOSE 160* 05/04/2015 0424   BUN 47* 05/04/2015 0424   CREATININE 7.64* 05/04/2015 0424   CALCIUM 7.3* 05/04/2015 0424   GFRNONAA 6* 05/04/2015 0424   GFRAA 7* 05/04/2015 0424   CBC    Component Value Date/Time   WBC 2.3* 05/04/2015 0424   WBC 5.0 07/20/2013 1456   RBC 3.50* 05/04/2015 0424   RBC 2.26* 09/30/2014 1324   RBC 3.18* 07/20/2013 1456   HGB 9.1* 05/04/2015 0424   HGB 9.3* 07/20/2013 1456   HCT 28.5* 05/04/2015 0424   HCT 28.8* 07/20/2013 1456   PLT 154 05/04/2015 0424   PLT 271 07/20/2013 1456   MCV 81.4 05/04/2015 0424   MCV 90.5 07/20/2013 1456   MCH 26.0 05/04/2015 0424   MCH 29.4 07/20/2013 1456   MCHC 31.9 05/04/2015 0424   MCHC 32.4 07/20/2013 1456    RDW 17.0* 05/04/2015 0424   RDW 15.8* 07/20/2013 1456   LYMPHSABS 0.6* 05/03/2015 0430   LYMPHSABS 2.3 07/20/2013 1456   MONOABS 0.2 05/03/2015 0430   MONOABS 0.3 07/20/2013 1456   EOSABS 0.0 05/03/2015 0430   EOSABS 0.1 07/20/2013 1456   BASOSABS 0.0 05/03/2015 0430   BASOSABS 0.0 07/20/2013 1456     Assessment: 1. New ESRD sec DM/HTN 2. Anemia on aranesp 3. Sec HPTH  PTH pend 4. DM 5. HTN 6. Hep C  Quantitative RNA 1270000 7. Gallstones  HIDA Ok 8. Jehovah's Witness 9. Bipolar  Plan: 1.  HD today 2. Awaiting outpt spot 3.  Consider GI to see to discuss tx for Hep C  [name],[name]

## 2015-05-04 NOTE — Anesthesia Procedure Notes (Signed)
Procedure Name: LMA Insertion Date/Time: 05/04/2015 7:53 AM Performed by: Sampson Si E Pre-anesthesia Checklist: Patient identified, Emergency Drugs available, Suction available, Patient being monitored and Timeout performed Patient Re-evaluated:Patient Re-evaluated prior to inductionOxygen Delivery Method: Circle system utilized Preoxygenation: Pre-oxygenation with 100% oxygen Intubation Type: IV induction Ventilation: Mask ventilation without difficulty LMA: LMA inserted LMA Size: 4.0 Number of attempts: 1 Placement Confirmation: positive ETCO2 and breath sounds checked- equal and bilateral Tube secured with: Tape Dental Injury: Teeth and Oropharynx as per pre-operative assessment

## 2015-05-04 NOTE — Progress Notes (Signed)
Patient arrived back to unit from OR at this time. No complains or concerns voiced at this this time. VSS. MD at bedside. Patient verbalized she wants to rest at this time. Surgical site CDI. Will continue to monitor.   Ave Filter, RN

## 2015-05-04 NOTE — Anesthesia Postprocedure Evaluation (Signed)
  Anesthesia Post-op Note  Patient: [name]  Procedure(s) Performed: Procedure(s): ARTERIOVENOUS (AV) GRAFT INSERTION (Left)  Patient Location: PACU  Anesthesia Type:General  Level of Consciousness: awake, alert  and oriented  Airway and Oxygen Therapy: Patient Spontanous Breathing  Post-op Pain: mild  Post-op Assessment: Post-op Vital signs reviewed, Patient's Cardiovascular Status Stable, Respiratory Function Stable, Patent Airway and Pain level controlled              Post-op Vital Signs: stable  Last Vitals:  Filed Vitals:   05/04/15 1530  BP: 127/50  Pulse: 80  Temp:   Resp:     Complications: No apparent anesthesia complications

## 2015-05-04 NOTE — Anesthesia Preprocedure Evaluation (Addendum)
Anesthesia Evaluation  Patient identified by MRN, date of birth, ID band  Reviewed: Allergy & Precautions, NPO status , Patient's Chart, lab work & pertinent test results  Airway Mallampati: II  TM Distance: >3 FB Neck ROM: Full    Dental  (+) Partial Upper, Partial Lower, Dental Advisory Given   Pulmonary former smoker,    breath sounds clear to auscultation       Cardiovascular hypertension, Pt. on home beta blockers  Rhythm:Regular     Neuro/Psych  Headaches, Seizures -,  Anxiety Depression Bipolar Disorder Schizophrenia TIA Neuromuscular disease CVA    GI/Hepatic hiatal hernia, GERD  Medicated and Controlled,(+) Hepatitis -, C  Endo/Other  diabetes, Type 2  Renal/GU Renal Insufficiency and DialysisRenal disease     Musculoskeletal   Abdominal   Peds  Hematology   Anesthesia Other Findings   Reproductive/Obstetrics                            Anesthesia Physical Anesthesia Plan  ASA: III  Anesthesia Plan: General   Post-op Pain Management:    Induction: Intravenous  Airway Management Planned: LMA  Additional Equipment:   Intra-op Plan:   Post-operative Plan:   Informed Consent: I have reviewed the patients History and Physical, chart, labs and discussed the procedure including the risks, benefits and alternatives for the proposed anesthesia with the patient or authorized representative who has indicated his/her understanding and acceptance.   Dental advisory given  Plan Discussed with: CRNA and Anesthesiologist  Anesthesia Plan Comments:         Anesthesia Quick Evaluation

## 2015-05-05 ENCOUNTER — Encounter (HOSPITAL_COMMUNITY): Payer: Self-pay | Admitting: Vascular Surgery

## 2015-05-05 DIAGNOSIS — E872 Acidosis: Secondary | ICD-10-CM

## 2015-05-05 DIAGNOSIS — N179 Acute kidney failure, unspecified: Secondary | ICD-10-CM | POA: Diagnosis not present

## 2015-05-05 LAB — RENAL FUNCTION PANEL
ANION GAP: 5 (ref 5–15)
Albumin: 2.2 g/dL — ABNORMAL LOW (ref 3.5–5.0)
BUN: 15 mg/dL (ref 6–20)
CO2: 27 mmol/L (ref 22–32)
Calcium: 7.3 mg/dL — ABNORMAL LOW (ref 8.9–10.3)
Chloride: 101 mmol/L (ref 101–111)
Creatinine, Ser: 3.85 mg/dL — ABNORMAL HIGH (ref 0.44–1.00)
GFR calc non Af Amer: 13 mL/min — ABNORMAL LOW (ref >60)
GFR, EST AFRICAN AMERICAN: 15 mL/min — AB (ref >60)
GLUCOSE: 165 mg/dL — AB (ref 65–99)
POTASSIUM: 3.9 mmol/L (ref 3.5–5.1)
Phosphorus: 2.3 mg/dL — ABNORMAL LOW (ref 2.5–4.6)
Sodium: 133 mmol/L — ABNORMAL LOW (ref 135–145)

## 2015-05-05 LAB — GLUCOSE, CAPILLARY
GLUCOSE-CAPILLARY: 117 mg/dL — AB (ref 65–99)
GLUCOSE-CAPILLARY: 241 mg/dL — AB (ref 65–99)
Glucose-Capillary: 314 mg/dL — ABNORMAL HIGH (ref 65–99)
Glucose-Capillary: 145 mg/dL — ABNORMAL HIGH (ref 65–99)

## 2015-05-05 MED ORDER — INSULIN ASPART 100 UNIT/ML ~~LOC~~ SOLN
0.0000 [IU] | Freq: Every day | SUBCUTANEOUS | Status: DC
  Administered 2015-05-05: 2 [IU] via SUBCUTANEOUS

## 2015-05-05 MED ORDER — POTASSIUM CHLORIDE CRYS ER 20 MEQ PO TBCR
40.0000 meq | EXTENDED_RELEASE_TABLET | Freq: Every day | ORAL | Status: DC
  Administered 2015-05-05: 40 meq via ORAL
  Filled 2015-05-05: qty 2

## 2015-05-05 MED ORDER — INSULIN GLARGINE 100 UNIT/ML ~~LOC~~ SOLN
15.0000 [IU] | Freq: Every day | SUBCUTANEOUS | Status: DC
  Administered 2015-05-05 – 2015-05-06 (×2): 15 [IU] via SUBCUTANEOUS
  Filled 2015-05-05 (×3): qty 0.15

## 2015-05-05 MED ORDER — HYDROMORPHONE HCL 1 MG/ML IJ SOLN
INTRAMUSCULAR | Status: AC
  Administered 2015-05-05: 1 mg via INTRAVENOUS
  Filled 2015-05-05: qty 1

## 2015-05-05 MED ORDER — WHITE PETROLATUM GEL
Status: AC
  Filled 2015-05-05: qty 1

## 2015-05-05 MED ORDER — CLONIDINE HCL 0.1 MG PO TABS: 0.1000 mg | ORAL_TABLET | ORAL | Status: DC | PRN

## 2015-05-05 MED ORDER — AMLODIPINE BESYLATE 5 MG PO TABS
5.0000 mg | ORAL_TABLET | Freq: Every day | ORAL | Status: DC
  Administered 2015-05-05: 5 mg via ORAL
  Filled 2015-05-05: qty 1

## 2015-05-05 MED ORDER — INSULIN ASPART 100 UNIT/ML ~~LOC~~ SOLN
3.0000 [IU] | Freq: Three times a day (TID) | SUBCUTANEOUS | Status: DC
  Administered 2015-05-05 – 2015-05-07 (×5): 3 [IU] via SUBCUTANEOUS

## 2015-05-05 MED ORDER — INSULIN ASPART 100 UNIT/ML ~~LOC~~ SOLN
0.0000 [IU] | Freq: Three times a day (TID) | SUBCUTANEOUS | Status: DC
  Administered 2015-05-05 – 2015-05-06 (×2): 2 [IU] via SUBCUTANEOUS
  Administered 2015-05-06: 15 [IU] via SUBCUTANEOUS
  Administered 2015-05-06: 2 [IU] via SUBCUTANEOUS
  Administered 2015-05-07 (×2): 5 [IU] via SUBCUTANEOUS

## 2015-05-05 NOTE — Progress Notes (Signed)
Ms. [name] was seen today to provide information/education about dialysis. Her sister is on dialysis and is doing well she states, but she doesn't know if she is interested in dialysis for the rest of her life. She lives with her aunt who continues to work and be independent at the age of 37. Dialysis education folder with the options was reviewed as well as a DVD to take home and review. My contact information was also provided.

## 2015-05-05 NOTE — Care Management Important Message (Signed)
Important Message  Patient Details  Name: [name] MRN: [id number] Date of Birth: 1966-01-09   Medicare Important Message Given:  Yes-second notification given    Delorse Lek 05/05/2015, 3:15 PM

## 2015-05-05 NOTE — Progress Notes (Signed)
[facility]                                                                              Patient Demographics  [name], is a 49 y.o. female, DOB - 14-Jan-1966, XO:[id number]  Admit date - 05/01/2015   Admitting Physician Theodis Blaze, MD  Outpatient Primary MD for the patient is WEBB, Valla Leaver, MD  LOS - 4   Chief Complaint  Patient presents with  . Nausea  . Emesis       Brief HPI   Patient is 49 year old female with multiple medical conditions including chronic kidney disease stage V with associated anemia of chronic disease and iron deficiency, uncontrolled diabetes mellitus, hypertension, bipolar disorder and schizoaffective disorder, hepatitis C, has refused dialysis in the past. Patient subsequently presented to [facility] emergency department on 9/26 with main concern of several days duration of progressively worsening right upper quadrant abdominal pain, constant and throbbing, 7 out of 10 in severity when present, radiating to entire abdominal area, worse with eating and with no specific alleviating factors.  No fevers or chills.  In emergency department, blood pressure noted to be significantly elevated in 200s. blood work notable for WBC 3.0, Hg 11.3, sodium 126, bicarbonate 11, creatinine 8.23, glucose 422. Imaging studies notable for gallstones and nonspecific gallbladder wall thickening. Nephrology and surgery team consulted. Patient was admitted to stepdown unit for further workup.    Assessment & Plan    Acute ESRD on CKD stage 5 due to DM + HTN  -Nephrology and interventional radiology was consulted. Patient underwent tunneled HD catheter placement on 9/28 by IR.  - Vascular surgery was consulted and patient underwent left brachial forearm AV graft placement on 9/29. - CLIP process to be started by nephrology  RUQ abdominal pain -Resolved -non specific findings on abd [place] - HIDA scan unremarkable  -Gen Surgery was consulted however  recommended that there is no indication for cholecystectomy or any surgical intervention at this time.    Chronic pain issues, pain in the left arm Patient continues to be tearful and demanding for the pain medications, states that she used to go in a pain clinic which was shut down. She states that prior to admission, she was on Percocet 10mg  as needed.  She is noted to be on Dilaudid 1 mg every 2 hours prn and Percocet 5/325 mg 1-2 tabs every 4 hours prn. I am also concerned for pain medication seeking behavior and explained to the patient that she is on very frequent IV pain medications, increasing further can cause respiratory depression, altered mental status and potentially lead to more harm.  Diabetes mellitus with nephropathy uncontrolled -Restart Lantus 15 units, NovoLog meal coverage with sliding scale insulin, monitor for hypoglycemia  Hypertensive urgency - BP has improved, continue clonidine, Coreg, hydralazine  Acute hyponatremia -Na improving, likely due to decompensated CHF w/ ESRD  Chronic combined systolic and diastolic CHF (congestive heart failure) volume control per HD   Hypokalemia Replace orally today   Leukopenia -chronic and at baseline   Hep C  Bipolar disorder -stable mental status   Anemia of chronic kidney failure -  No signs of active bleeding - Hgb stable   History of seizures  Continue Keppra   Code Status:Full code.   Family Communication: Discussed in detail with the patient, all imaging results, lab results explained to the patient   Disposition Plan: Not medically ready  Time Spent in minutes   25 minutes  Procedures  Tunneled HD cath AV graft placement HIDA scan  Consults   General surgery  Nephrology  Vascular surgery   DVT Prophylaxis SCDs  Medications  Scheduled Meds: . amLODipine  5 mg Oral QHS  . antiseptic oral rinse  7 mL Mouth Rinse q12n4p  . carvedilol  25 mg Oral BID WC  . chlorhexidine  15 mL Mouth Rinse BID    . [START ON 05/10/2015] darbepoetin (ARANESP) injection - DIALYSIS  100 mcg Intravenous Q Wed-HD  . folic acid  0.5 mg Oral Daily  . HYDROmorphone      . insulin aspart  0-9 Units Subcutaneous TID WC  . levETIRAcetam  500 mg Oral Q12H  . multivitamin  1 tablet Oral QHS   Continuous Infusions:   PRN Meds:.cloNIDine, diphenhydrAMINE, HYDROmorphone (DILAUDID) injection, ondansetron **OR** ondansetron (ZOFRAN) IV, oxyCODONE-acetaminophen, technetium TC 100M mebrofenin   Antibiotics   Anti-infectives    Start     Dose/Rate Route Frequency Ordered Stop   05/03/15 1015  vancomycin (VANCOCIN) IVPB 1000 mg/200 mL premix     1,000 mg 200 mL/hr over 60 Minutes Intravenous To Radiology 05/03/15 1004 05/03/15 1807        Subjective:   Emanuelly Bleecker was seen and examined today. Continues to be tearful and demanding for IV pain medications and more frequently. States pain is 10/10, also hurts in the left arm. She states that pain is not well controlled. Patient denies dizziness, chest pain, shortness of breath, abdominal pain, N/V/D/C. No acute events overnight.    Objective:   Blood pressure 161/87, pulse 87, temperature 98.4 F (36.9 C), temperature source Oral, resp. rate 18, height 5\' 4"  (1.626 m), weight 79.7 kg (175 lb 11.3 oz), SpO2 100 %.  Wt Readings from Last 3 Encounters:  05/05/15 79.7 kg (175 lb 11.3 oz)  04/06/15 79.379 kg (175 lb)  03/31/15 77.111 kg (170 lb)     Intake/Output Summary (Last 24 hours) at 05/05/15 1339 Last data filed at 05/05/15 0900  Gross per 24 hour  Intake    480 ml  Output   1000 ml  Net   -520 ml    Exam  General: Alert and oriented 3  HEENT:  PERRLA, EOMI, Anicteric Sclera, mucous membranes moist.   Neck: Supple, no JVD, no masses  CVS: S1 S2 clear, RRR  Respiratory: CTAB  Abdomen: Soft, nontender, nondistended, + bowel sounds  Ext: no cyanosis clubbing or edema  Neuro: no new deficits  Skin: No rashes   Psych: alert and  oriented 3  Data Review   Micro Results Recent Results (from the past 240 hour(s))  MRSA PCR Screening     Status: None   Collection Time: 05/01/15 11:45 PM  Result Value Ref Range Status   MRSA by PCR NEGATIVE NEGATIVE Final    Comment:        The GeneXpert MRSA Assay (FDA approved for NASAL specimens only), is one component of a comprehensive MRSA colonization surveillance program. It is not intended to diagnose MRSA infection nor to guide or monitor treatment for MRSA infections.   Surgical pcr screen     Status: None   Collection  Time: 05/04/15  5:43 AM  Result Value Ref Range Status   MRSA, PCR NEGATIVE NEGATIVE Final   Staphylococcus aureus NEGATIVE NEGATIVE Final    Comment:        The Xpert SA Assay (FDA approved for NASAL specimens in patients over 53 years of age), is one component of a comprehensive surveillance program.  Test performance has been validated by [facility] for patients greater than or equal to 24 year old. It is not intended to diagnose infection nor to guide or monitor treatment.     Radiology Reports Nm Hepato W/eject Fract  05/02/2015   CLINICAL DATA:  Right upper quadrant pain, cholelithiasis  EXAM: NUCLEAR MEDICINE HEPATOBILIARY IMAGING  TECHNIQUE: Sequential images of the abdomen were obtained out to 60 minutes following intravenous administration of radiopharmaceutical.  RADIOPHARMACEUTICALS:  Five mCi Tc-29m  Choletec IV  COMPARISON:  05/01/2015  FINDINGS: There is normal uptake throughout the liver immediately following injection. Visualization of the biliary tree is noted at 20 minutes with common bile duct activity at 20 minutes as well. Small bowel activity is noted at 25 minutes. The gallbladder is visualized at 70 minutes with progressive filling over the next 45 minutes.  IMPRESSION: Normal uptake and excretion of biliary tracer.  Right upper Select proper NMHepatobiliary template.   Electronically Signed   By: Inez Catalina M.D.    On: 05/02/2015 16:14   Ir Fluoro Guide Cv Line Right  05/03/2015   INDICATION: 49 year old with end-stage renal disease and starting hemodialysis.  EXAM: FLUOROSCOPIC AND ULTRASOUND GUIDED PLACEMENT OF A TUNNELED DIALYSIS CATHETER  Physician: Stephan Minister. Henn, MD  FLUOROSCOPY TIME:  30 seconds, 11.3 mGy  MEDICATIONS: 1 mg Versed, 25 mcg fentanyl.  1 g vancomycin.  A radiology nurse monitored the patient for moderate sedation.  Vancomycin was given within two hours of incision. Vancomycin was given due to a penicillin allergy.  ANESTHESIA/SEDATION: Moderate sedation time: 30 minutes  PROCEDURE: Informed consent was obtained for placement of a tunneled dialysis catheter. The patient was placed supine on the interventional table. Ultrasound confirmed a patent right internal jugularvein. Ultrasound images were obtained for documentation. The right side of the neck was prepped and draped in a sterile fashion. The right side of the neck neck was anesthetized with 1% lidocaine. Maximal barrier sterile technique was utilized including caps, mask, sterile gowns, sterile gloves, sterile drape, hand hygiene and skin antiseptic. A small incision was made with #11 blade scalpel. A 21 gauge needle directed into the right internal jugular vein with ultrasound guidance. A micropuncture dilator set was placed. A 19 cm tip to cuff Palindrome catheter was selected. The skin below the right clavicle was anesthetized and a small incision was made with an #11 blade scalpel. A subcutaneous tunnel was formed to the vein dermatotomy site. The catheter was brought through the tunnel. The vein dermatotomy site was dilated to accommodate a peel-away sheath. The catheter was placed through the peel-away sheath and directed into the central venous structures. The tip of the catheter was placed in the right atrium with fluoroscopy. Fluoroscopic images were obtained for documentation. Both lumens were found to aspirate and flush well. The proper  amount of heparin was flushed in both lumens. The vein dermatotomy site was closed using a single layer of absorbable suture and Dermabond. Gel-Foam was placed in the subcutaneous tract. The catheter was secured to the skin using Prolene suture.  FINDINGS: Catheter tip in the right atrium.  Estimated blood loss: Minimal  COMPLICATIONS:  None  IMPRESSION: Successful placement of a right jugular tunneled dialysis catheter using ultrasound and fluoroscopic guidance.   Electronically Signed   By: Markus Daft M.D.   On: 05/03/2015 18:06   Ir US Guide Vasc Access Right  05/03/2015   INDICATION: 49 year old with end-stage renal disease and starting hemodialysis.  EXAM: FLUOROSCOPIC AND ULTRASOUND GUIDED PLACEMENT OF A TUNNELED DIALYSIS CATHETER  Physician: Stephan Minister. Henn, MD  FLUOROSCOPY TIME:  30 seconds, 11.3 mGy  MEDICATIONS: 1 mg Versed, 25 mcg fentanyl.  1 g vancomycin.  A radiology nurse monitored the patient for moderate sedation.  Vancomycin was given within two hours of incision. Vancomycin was given due to a penicillin allergy.  ANESTHESIA/SEDATION: Moderate sedation time: 30 minutes  PROCEDURE: Informed consent was obtained for placement of a tunneled dialysis catheter. The patient was placed supine on the interventional table. Ultrasound confirmed a patent right internal jugularvein. Ultrasound images were obtained for documentation. The right side of the neck was prepped and draped in a sterile fashion. The right side of the neck neck was anesthetized with 1% lidocaine. Maximal barrier sterile technique was utilized including caps, mask, sterile gowns, sterile gloves, sterile drape, hand hygiene and skin antiseptic. A small incision was made with #11 blade scalpel. A 21 gauge needle directed into the right internal jugular vein with ultrasound guidance. A micropuncture dilator set was placed. A 19 cm tip to cuff Palindrome catheter was selected. The skin below the right clavicle was anesthetized and a small  incision was made with an #11 blade scalpel. A subcutaneous tunnel was formed to the vein dermatotomy site. The catheter was brought through the tunnel. The vein dermatotomy site was dilated to accommodate a peel-away sheath. The catheter was placed through the peel-away sheath and directed into the central venous structures. The tip of the catheter was placed in the right atrium with fluoroscopy. Fluoroscopic images were obtained for documentation. Both lumens were found to aspirate and flush well. The proper amount of heparin was flushed in both lumens. The vein dermatotomy site was closed using a single layer of absorbable suture and Dermabond. Gel-Foam was placed in the subcutaneous tract. The catheter was secured to the skin using Prolene suture.  FINDINGS: Catheter tip in the right atrium.  Estimated blood loss: Minimal  COMPLICATIONS: None  IMPRESSION: Successful placement of a right jugular tunneled dialysis catheter using ultrasound and fluoroscopic guidance.   Electronically Signed   By: Markus Daft M.D.   On: 05/03/2015 18:06   US Abdomen Limited Ruq  05/01/2015   CLINICAL DATA:  Right upper quadrant abdominal pain. Nausea and vomiting for 5 days. Hepatitis. Chronic kidney disease.  EXAM: US ABDOMEN LIMITED - RIGHT UPPER QUADRANT  COMPARISON:  Renal ultrasound of 11/09/2014.  FINDINGS: Gallbladder:  Multiple gallstones, measuring up to 9 mm. Wall thickening, up to 5 mm. No pericholecystic edema. Murphy's sign not well evaluated; patient pre-medicated.  Common bile duct:  Diameter: Normal, 4 mm.  Liver:  Mildly heterogeneous hepatic echotexture, nonspecific.  Incidental note is made of renal hyperechogenicity.  IMPRESSION: 1. Gallstones with nonspecific gallbladder wall thickening. Sonographic Percell Miller sign could not the evaluated secondary to patient being pre-medicated. Acute cholecystitis cannot be entirely excluded. CT or nuclear medicine scintigraphy could be informative. 2. Renal echogenicity,  suggesting medical renal disease.   Electronically Signed   By: Abigail Miyamoto M.D.   On: 05/01/2015 12:57    CBC  Recent Labs Lab 05/01/15 1112 05/02/15 0330 05/03/15 0430 05/04/15 0424  WBC  3.0* 2.9* 2.4* 2.3*  HGB 11.3* 9.4* 10.0* 9.1*  HCT 34.1* 28.6* 31.7* 28.5*  PLT 176 160 165 154  MCV 82.0 82.7 82.1 81.4  MCH 27.2 27.2 25.9* 26.0  MCHC 33.1 32.9 31.5 31.9  RDW 16.2* 16.4* 16.6* 17.0*  LYMPHSABS 0.5*  --  0.6*  --   MONOABS 0.3  --  0.2  --   EOSABS 0.0  --  0.0  --   BASOSABS 0.0  --  0.0  --     Chemistries   Recent Labs Lab 05/01/15 1112 05/01/15 1647 05/02/15 0330 05/04/15 0424  NA 126* 128* 131* 131*  K 3.5 3.4* 3.4* 3.3*  CL 105 105 108 104  CO2 11* 13* 11* 18*  GLUCOSE 422* 388* 135* 160*  BUN 69* 65* 61* 47*  CREATININE 8.23* 7.78* 8.12* 7.64*  CALCIUM 8.3* 8.4* 7.6* 7.3*  AST 47*  --  36 50*  ALT 64*  --  47 55*  ALKPHOS 97  --  66 80  BILITOT 0.6  --  0.5 0.4   ------------------------------------------------------------------------------------------------------------------ estimated creatinine clearance is 9.2 mL/min (by C-G formula based on Cr of 7.64). ------------------------------------------------------------------------------------------------------------------ No results for input(s): HGBA1C in the last 72 hours. ------------------------------------------------------------------------------------------------------------------ No results for input(s): CHOL, HDL, LDLCALC, TRIG, CHOLHDL, LDLDIRECT in the last 72 hours. ------------------------------------------------------------------------------------------------------------------ No results for input(s): TSH, T4TOTAL, T3FREE, THYROIDAB in the last 72 hours.  Invalid input(s): FREET3 ------------------------------------------------------------------------------------------------------------------ No results for input(s): VITAMINB12, FOLATE, FERRITIN, TIBC, IRON, RETICCTPCT in the last 72  hours.  Coagulation profile  Recent Labs Lab 05/03/15 0908  INR 1.37    No results for input(s): DDIMER in the last 72 hours.  Cardiac Enzymes No results for input(s): CKMB, TROPONINI, MYOGLOBIN in the last 168 hours.  Invalid input(s): CK ------------------------------------------------------------------------------------------------------------------ Invalid input(s): [place]  05/03/15 2241 05/04/15 0923 05/04/15 1213 05/04/15 1804 05/04/15 2059 05/05/15 0754  GLUCAP 125* 119* 165* 149* 221* 314*     RAI,RIPUDEEP M.D. [facility] 05/05/2015, 1:39 PM  Pager: [phone] Between 7am to 7pm - call Pager - [phone]  After 7pm go to [email or web address] - password [email or web address]  Call night coverage person covering after 7pm

## 2015-05-05 NOTE — Progress Notes (Signed)
[facility] Progress Note  Assessment/Plan: 1. New ESRD sec DM/HTN with hypokalemia - 4 K bath 2. Anemia on aranesp 100  Given 9/28 24% sat ferritin 316- replete Fe 3. Sec HPTH iPTH 121 07/2014- repeat iPTH pend 4. DM 5. HTN - titrating volume down 1 L 9/28 and 9.29 6. Hep C Qual RNA 1270000 7. Gallstones HIDA Ok-LFT sl elevated could also be due to Hep C 8. Jehovah's Witness 9. Bipolar 10. Malnutrition - alb 2  [place] 05/05/2015,8:30 AM  LOS: 4 days  I have seen and examined this patient and agree with plan per Amalia Hailey.  Will plan HD today. Awaiting outpt spot.  I don't think clonidine is a good BP med for her as her compliance is very questionable.  Will use only prn.  Change hydralazine to amlodipine q hs.  Will also lower DW. MATTINGLY,MICHAEL T,MD 05/05/2015 9:08 AM Subjective:   CO pain Lt arm from AVG  Objective Filed Vitals:   05/04/15 1726 05/04/15 1759 05/04/15 2103 05/05/15 0538  BP: 138/75 162/70 135/76 145/69  Pulse: 90 91 90 89  Temp: 98.4 F (36.9 C) 98.6 F (37 C) 98.7 F (37.1 C) 98.9 F (37.2 C)  TempSrc:  Oral Oral Oral  Resp: 20 20 18 17   Height:      Weight:      SpO2: 98% 98% 100% 100%   Physical Exam General:Awake and alert Heart:RRR Lungs:clear Abdomen:+ BS ND minimal abd tenderness Extremities:tr edema   Lt AVG + bruit Dialysis Access:  Rt IJ PC  Additional Objective Labs: Basic Metabolic Panel:  Recent Labs Lab 05/01/15 1647 05/02/15 0330 05/02/15 0957 05/04/15 0424  NA 128* 131*  --  131*  K 3.4* 3.4*  --  3.3*  CL 105 108  --  104  CO2 13* 11*  --  18*  GLUCOSE 388* 135*  --  160*  BUN 65* 61*  --  47*  CREATININE 7.78* 8.12*  --  7.64*  CALCIUM 8.4* 7.6*  --  7.3*  PHOS  --   --  4.7*  --    Liver Function Tests:  Recent Labs Lab 05/01/15 1112 05/02/15 0330 05/04/15 0424  AST 47* 36 50*  ALT 64* 47 55*  ALKPHOS 97 66 80  BILITOT  0.6 0.5 0.4  PROT 7.2 5.5* 5.9*  ALBUMIN 2.4* 1.8* 2.0*    Recent Labs Lab 05/01/15 1112  LIPASE 70*   CBC:  Recent Labs Lab 05/01/15 1112 05/02/15 0330 05/03/15 0430 05/04/15 0424  WBC 3.0* 2.9* 2.4* 2.3*  NEUTROABS 2.2  --  1.5*  --   HGB 11.3* 9.4* 10.0* 9.1*  HCT 34.1* 28.6* 31.7* 28.5*  MCV 82.0 82.7 82.1 81.4  PLT 176 160 165 154   CBG:  Recent Labs Lab 05/04/15 0923 05/04/15 1213 05/04/15 1804 05/04/15 2059 05/05/15 0754  GLUCAP 119* 165* 149* 221* 314*   Iron Studies:  Recent Labs  05/02/15 0957  IRON 40  TIBC 168*  FERRITIN 316*    Studies/Results: Ir Fluoro Guide Cv Line Right  05/03/2015   INDICATION: 49 year old with end-stage renal disease and starting hemodialysis.  EXAM: FLUOROSCOPIC AND ULTRASOUND GUIDED PLACEMENT OF A TUNNELED DIALYSIS CATHETER  Physician: Stephan Minister. Henn, MD  FLUOROSCOPY TIME:  30 seconds, 11.3 mGy  MEDICATIONS: 1 mg Versed, 25 mcg fentanyl.  1 g vancomycin.  A radiology nurse monitored the patient for moderate sedation.  Vancomycin was given within  two hours of incision. Vancomycin was given due to a penicillin allergy.  ANESTHESIA/SEDATION: Moderate sedation time: 30 minutes  PROCEDURE: Informed consent was obtained for placement of a tunneled dialysis catheter. The patient was placed supine on the interventional table. Ultrasound confirmed a patent right internal jugularvein. Ultrasound images were obtained for documentation. The right side of the neck was prepped and draped in a sterile fashion. The right side of the neck neck was anesthetized with 1% lidocaine. Maximal barrier sterile technique was utilized including caps, mask, sterile gowns, sterile gloves, sterile drape, hand hygiene and skin antiseptic. A small incision was made with #11 blade scalpel. A 21 gauge needle directed into the right internal jugular vein with ultrasound guidance. A micropuncture dilator set was placed. A 19 cm tip to cuff Palindrome catheter was  selected. The skin below the right clavicle was anesthetized and a small incision was made with an #11 blade scalpel. A subcutaneous tunnel was formed to the vein dermatotomy site. The catheter was brought through the tunnel. The vein dermatotomy site was dilated to accommodate a peel-away sheath. The catheter was placed through the peel-away sheath and directed into the central venous structures. The tip of the catheter was placed in the right atrium with fluoroscopy. Fluoroscopic images were obtained for documentation. Both lumens were found to aspirate and flush well. The proper amount of heparin was flushed in both lumens. The vein dermatotomy site was closed using a single layer of absorbable suture and Dermabond. Gel-Foam was placed in the subcutaneous tract. The catheter was secured to the skin using Prolene suture.  FINDINGS: Catheter tip in the right atrium.  Estimated blood loss: Minimal  COMPLICATIONS: None  IMPRESSION: Successful placement of a right jugular tunneled dialysis catheter using ultrasound and fluoroscopic guidance.   Electronically Signed   By: Markus Daft M.D.   On: 05/03/2015 18:06   Ir US Guide Vasc Access Right  05/03/2015   INDICATION: 49 year old with end-stage renal disease and starting hemodialysis.  EXAM: FLUOROSCOPIC AND ULTRASOUND GUIDED PLACEMENT OF A TUNNELED DIALYSIS CATHETER  Physician: Stephan Minister. Henn, MD  FLUOROSCOPY TIME:  30 seconds, 11.3 mGy  MEDICATIONS: 1 mg Versed, 25 mcg fentanyl.  1 g vancomycin.  A radiology nurse monitored the patient for moderate sedation.  Vancomycin was given within two hours of incision. Vancomycin was given due to a penicillin allergy.  ANESTHESIA/SEDATION: Moderate sedation time: 30 minutes  PROCEDURE: Informed consent was obtained for placement of a tunneled dialysis catheter. The patient was placed supine on the interventional table. Ultrasound confirmed a patent right internal jugularvein. Ultrasound images were obtained for documentation.  The right side of the neck was prepped and draped in a sterile fashion. The right side of the neck neck was anesthetized with 1% lidocaine. Maximal barrier sterile technique was utilized including caps, mask, sterile gowns, sterile gloves, sterile drape, hand hygiene and skin antiseptic. A small incision was made with #11 blade scalpel. A 21 gauge needle directed into the right internal jugular vein with ultrasound guidance. A micropuncture dilator set was placed. A 19 cm tip to cuff Palindrome catheter was selected. The skin below the right clavicle was anesthetized and a small incision was made with an #11 blade scalpel. A subcutaneous tunnel was formed to the vein dermatotomy site. The catheter was brought through the tunnel. The vein dermatotomy site was dilated to accommodate a peel-away sheath. The catheter was placed through the peel-away sheath and directed into the central venous structures. The tip of the  catheter was placed in the right atrium with fluoroscopy. Fluoroscopic images were obtained for documentation. Both lumens were found to aspirate and flush well. The proper amount of heparin was flushed in both lumens. The vein dermatotomy site was closed using a single layer of absorbable suture and Dermabond. Gel-Foam was placed in the subcutaneous tract. The catheter was secured to the skin using Prolene suture.  FINDINGS: Catheter tip in the right atrium.  Estimated blood loss: Minimal  COMPLICATIONS: None  IMPRESSION: Successful placement of a right jugular tunneled dialysis catheter using ultrasound and fluoroscopic guidance.   Electronically Signed   By: Markus Daft M.D.   On: 05/03/2015 18:06   Medications: . dextrose 5 % and 0.45% NaCl 30 mL/hr at 05/05/15 0427   . antiseptic oral rinse  7 mL Mouth Rinse q12n4p  . carvedilol  25 mg Oral BID WC  . chlorhexidine  15 mL Mouth Rinse BID  . cloNIDine  0.1 mg Oral TID  . [START ON 05/10/2015] darbepoetin (ARANESP) injection - DIALYSIS  100 mcg  Intravenous Q Wed-HD  . folic acid  0.5 mg Oral Daily  . hydrALAZINE  10 mg Oral 3 times per day  . Influenza vac split quadrivalent PF  0.5 mL Intramuscular Tomorrow-1000  . insulin aspart  0-9 Units Subcutaneous TID WC  . levETIRAcetam  500 mg Oral Q12H  . multivitamin  1 tablet Oral QHS

## 2015-05-05 NOTE — Progress Notes (Signed)
Patient gone to dialysis at this time. Alert and in stable condition.

## 2015-05-05 NOTE — Progress Notes (Signed)
   VASCULAR SURGERY ASSESSMENT & PLAN:  * 1 Day Post-Op s/p: New left forearm AVG  *  Graft working well. Can use in 1 month  * Vascular will be available as needed  SUBJECTIVE: Moderate discomfort  PHYSICAL EXAM: Filed Vitals:   05/04/15 1726 05/04/15 1759 05/04/15 2103 05/05/15 0538  BP: 138/75 162/70 135/76 145/69  Pulse: 90 91 90 89  Temp: 98.4 F (36.9 C) 98.6 F (37 C) 98.7 F (37.1 C) 98.9 F (37.2 C)  TempSrc:  Oral Oral Oral  Resp: 20 20 18 17   Height:      Weight:      SpO2: 98% 98% 100% 100%   Good thrill in left forearm AVF Palpable left radial pulse.  LABS: Lab Results  Component Value Date   WBC 2.3* 05/04/2015   HGB 9.1* 05/04/2015   HCT 28.5* 05/04/2015   MCV 81.4 05/04/2015   PLT 154 05/04/2015   Lab Results  Component Value Date   CREATININE 7.64* 05/04/2015   Lab Results  Component Value Date   INR 1.37 05/03/2015   CBG (last 3)   Recent Labs  05/04/15 1213 05/04/15 1804 05/04/15 2059  GLUCAP 165* 149* 221*    Principal Problem:   RUQ abdominal pain Active Problems:   Hep C w/o coma, chronic   CKD (chronic kidney disease) stage 5, GFR less than 15 ml/min   Bipolar disorder   Chronic combined systolic and diastolic CHF (congestive heart failure)   Metabolic acidosis   Diabetes mellitus with nephropathy   Hypokalemia   Hypertensive urgency   Acute hyponatremia   Leukopenia   Anemia of chronic kidney failure   Bipolar 2 disorder   Chronic renal failure, stage 5   Dialysis patient   Right upper quadrant pain    Gae Gallop Beeper: [id number] 05/05/2015

## 2015-05-06 LAB — RENAL FUNCTION PANEL
ALBUMIN: 2 g/dL — AB (ref 3.5–5.0)
Anion gap: 7 (ref 5–15)
BUN: 22 mg/dL — AB (ref 6–20)
CO2: 21 mmol/L — ABNORMAL LOW (ref 22–32)
CREATININE: 5.14 mg/dL — AB (ref 0.44–1.00)
Calcium: 7 mg/dL — ABNORMAL LOW (ref 8.9–10.3)
Chloride: 100 mmol/L — ABNORMAL LOW (ref 101–111)
GFR calc Af Amer: 10 mL/min — ABNORMAL LOW (ref >60)
GFR calc non Af Amer: 9 mL/min — ABNORMAL LOW (ref >60)
GLUCOSE: 326 mg/dL — AB (ref 65–99)
PHOSPHORUS: 3.4 mg/dL (ref 2.5–4.6)
POTASSIUM: 5.4 mmol/L — AB (ref 3.5–5.1)
SODIUM: 128 mmol/L — AB (ref 135–145)

## 2015-05-06 LAB — GLUCOSE, CAPILLARY
GLUCOSE-CAPILLARY: 160 mg/dL — AB (ref 65–99)
Glucose-Capillary: 352 mg/dL — ABNORMAL HIGH (ref 65–99)
Glucose-Capillary: 124 mg/dL — ABNORMAL HIGH (ref 65–99)
Glucose-Capillary: 131 mg/dL — ABNORMAL HIGH (ref 65–99)

## 2015-05-06 LAB — HEMOGLOBIN A1C
HEMOGLOBIN A1C: 10.3 % — AB (ref 4.8–5.6)
Mean Plasma Glucose: 249 mg/dL

## 2015-05-06 LAB — PARATHYROID HORMONE, INTACT (NO CA): PTH: 7 pg/mL — ABNORMAL LOW (ref 15–65)

## 2015-05-06 MED ORDER — POLYETHYLENE GLYCOL 3350 17 G PO PACK
17.0000 g | PACK | Freq: Every day | ORAL | Status: DC | PRN
  Administered 2015-05-06: 17 g via ORAL
  Filled 2015-05-06: qty 1

## 2015-05-06 MED ORDER — DOCUSATE SODIUM 100 MG PO CAPS
100.0000 mg | ORAL_CAPSULE | Freq: Two times a day (BID) | ORAL | Status: DC
  Administered 2015-05-06 – 2015-05-07 (×3): 100 mg via ORAL
  Filled 2015-05-06 (×3): qty 1

## 2015-05-06 MED ORDER — SODIUM CHLORIDE 0.9 % IV SOLN
125.0000 mg | INTRAVENOUS | Status: AC
  Filled 2015-05-06 (×2): qty 10

## 2015-05-06 NOTE — Progress Notes (Signed)
S: Pain better in Lt arm.  No abd pain O:BP 130/71 mmHg  Pulse 88  Temp(Src) 98.5 F (36.9 C) (Oral)  Resp 18  Ht 5\' 4"  (1.626 m)  Wt 77.5 kg (170 lb 13.7 oz)  BMI 29.31 kg/m2  SpO2 99%  Intake/Output Summary (Last 24 hours) at 05/06/15 0808 Last data filed at 05/05/15 1700  Gross per 24 hour  Intake    360 ml  Output   2000 ml  Net  -1640 ml   Weight change:  IN:[id number] and alert CVS:RRR Resp:clear Abd:+bs soft, NTND Ext: tr-1+ edema  Lt AVG + bruit, + edema NEURO:CNI Ox3 no asterixis Rt IJ PC   . amLODipine  5 mg Oral QHS  . antiseptic oral rinse  7 mL Mouth Rinse q12n4p  . carvedilol  25 mg Oral BID WC  . chlorhexidine  15 mL Mouth Rinse BID  . [START ON 05/10/2015] darbepoetin (ARANESP) injection - DIALYSIS  100 mcg Intravenous Q Wed-HD  . folic acid  0.5 mg Oral Daily  . insulin aspart  0-15 Units Subcutaneous TID WC  . insulin aspart  0-5 Units Subcutaneous QHS  . insulin aspart  3 Units Subcutaneous TID WC  . insulin glargine  15 Units Subcutaneous QHS  . levETIRAcetam  500 mg Oral Q12H  . multivitamin  1 tablet Oral QHS  . white petrolatum       No results found. BMET    Component Value Date/Time   NA 128* 05/06/2015 0612   K 5.4* 05/06/2015 0612   CL 100* 05/06/2015 0612   CO2 21* 05/06/2015 0612   GLUCOSE 326* 05/06/2015 0612   BUN 22* 05/06/2015 0612   CREATININE 5.14* 05/06/2015 0612   CALCIUM 7.0* 05/06/2015 0612   GFRNONAA 9* 05/06/2015 0612   GFRAA 10* 05/06/2015 0612   CBC    Component Value Date/Time   WBC 2.3* 05/04/2015 0424   WBC 5.0 07/20/2013 1456   RBC 3.50* 05/04/2015 0424   RBC 2.26* 09/30/2014 1324   RBC 3.18* 07/20/2013 1456   HGB 9.1* 05/04/2015 0424   HGB 9.3* 07/20/2013 1456   HCT 28.5* 05/04/2015 0424   HCT 28.8* 07/20/2013 1456   PLT 154 05/04/2015 0424   PLT 271 07/20/2013 1456   MCV 81.4 05/04/2015 0424   MCV 90.5 07/20/2013 1456   MCH 26.0 05/04/2015 0424   MCH 29.4 07/20/2013 1456   MCHC 31.9 05/04/2015 0424    MCHC 32.4 07/20/2013 1456   RDW 17.0* 05/04/2015 0424   RDW 15.8* 07/20/2013 1456   LYMPHSABS 0.6* 05/03/2015 0430   LYMPHSABS 2.3 07/20/2013 1456   MONOABS 0.2 05/03/2015 0430   MONOABS 0.3 07/20/2013 1456   EOSABS 0.0 05/03/2015 0430   EOSABS 0.1 07/20/2013 1456   BASOSABS 0.0 05/03/2015 0430   BASOSABS 0.0 07/20/2013 1456     Assessment: 1. New ESRD sec DM/HTN  Has spot at [facility] MWF but for next week only will do Tues/wed/fri 2. Anemia on aranesp 3. Sec HPTH  PTH pend 4. DM 5. HTN 6. Hep C  Quantitative RNA 1270000 7. Gallstones  HIDA Ok 8. Jehovah's Witness 9. Bipolar  Plan: 1.  HD today.  She "does not feel mentally prepared to go home today".  Anticipate DC tomorrow.  Spoke with nurse about SW seeing pt to arrange transportation to HD for MWF but for next week TWF.   Kush Farabee T

## 2015-05-06 NOTE — Progress Notes (Signed)
[facility]                                                                              Patient Demographics  [name], is a 49 y.o. female, DOB - 19-Jul-1966, XO:[id number]  Admit date - 05/01/2015   Admitting Physician Theodis Blaze, MD  Outpatient Primary MD for the patient is WEBB, Valla Leaver, MD  LOS - 5   Chief Complaint  Patient presents with  . Nausea  . Emesis       Brief HPI   Patient is 49 year old female with multiple medical conditions including chronic kidney disease stage V with associated anemia of chronic disease and iron deficiency, uncontrolled diabetes mellitus, hypertension, bipolar disorder and schizoaffective disorder, hepatitis C, has refused dialysis in the past. Patient subsequently presented to [facility] emergency department on 9/26 with main concern of several days duration of progressively worsening right upper quadrant abdominal pain, constant and throbbing, 7 out of 10 in severity when present, radiating to entire abdominal area, worse with eating and with no specific alleviating factors.  No fevers or chills.  In emergency department, blood pressure noted to be significantly elevated in 200s. blood work notable for WBC 3.0, Hg 11.3, sodium 126, bicarbonate 11, creatinine 8.23, glucose 422. Imaging studies notable for gallstones and nonspecific gallbladder wall thickening. Nephrology and surgery team consulted. Patient was admitted to stepdown unit for further workup.    Assessment & Plan    Acute ESRD on CKD stage 5 due to DM + HTN  -Nephrology and interventional radiology was consulted. Patient underwent tunneled HD catheter placement on 9/28 by IR.  - Vascular surgery was consulted and patient underwent left brachial forearm AV graft placement on 9/29. - CLIP process started by nephrology, per renal, patient has a spot at [facility] MWF but for next week only, then will do Tues/wed/fri - HD planned today  RUQ abdominal  pain -Resolved -non specific findings on abd [place] - HIDA scan unremarkable  -Gen Surgery was consulted however recommended that there is no indication for cholecystectomy or any surgical intervention at this time.    Chronic pain issues, pain in the left arm Patient continues to be tearful and demanding for the pain medications, states that she used to go in a pain clinic which was shut down. She states that prior to admission, she was on Percocet 10mg  as needed.  She is noted to be on Dilaudid 1 mg every 2 hours prn and Percocet 5/325 mg 1-2 tabs every 4 hours prn. I am also concerned for pain medication seeking behavior and explained to the patient that she is on very frequent IV pain medications, increasing further can cause respiratory depression, altered mental status and potentially lead to more harm. Comfortably resting upon entering the room then she starts asking for pain medications  Diabetes mellitus with nephropathy uncontrolled -Restart Lantus 15 units, NovoLog meal coverage with sliding scale insulin, monitor for hypoglycemia  Hypertensive urgency - BP has improved, continue clonidine, Coreg, hydralazine  Acute hyponatremia -Na improving, likely due to decompensated CHF w/ ESRD  Chronic combined systolic and diastolic CHF (congestive heart failure) volume  control per HD   Hypokalemia Replace orally today   Leukopenia -chronic and at baseline   Hep C  Bipolar disorder -stable mental status   Anemia of chronic kidney failure -No signs of active bleeding - Hgb stable   History of seizures  Continue Keppra   Code Status:Full code.   Family Communication: Discussed in detail with the patient, all imaging results, lab results explained to the patient   Disposition Plan: Patient wants to be discharged tomorrow  Time Spent in minutes   25 minutes  Procedures  Tunneled HD cath AV graft placement HIDA scan  Consults   General surgery  Nephrology  Vascular  surgery   DVT Prophylaxis SCDs  Medications  Scheduled Meds: . amLODipine  5 mg Oral QHS  . antiseptic oral rinse  7 mL Mouth Rinse q12n4p  . carvedilol  25 mg Oral BID WC  . chlorhexidine  15 mL Mouth Rinse BID  . [START ON 05/10/2015] darbepoetin (ARANESP) injection - DIALYSIS  100 mcg Intravenous Q Wed-HD  . folic acid  0.5 mg Oral Daily  . insulin aspart  0-15 Units Subcutaneous TID WC  . insulin aspart  0-5 Units Subcutaneous QHS  . insulin aspart  3 Units Subcutaneous TID WC  . insulin glargine  15 Units Subcutaneous QHS  . levETIRAcetam  500 mg Oral Q12H  . multivitamin  1 tablet Oral QHS   Continuous Infusions:   PRN Meds:.cloNIDine, diphenhydrAMINE, HYDROmorphone (DILAUDID) injection, ondansetron **OR** ondansetron (ZOFRAN) IV, oxyCODONE-acetaminophen, technetium TC 18M mebrofenin   Antibiotics   Anti-infectives    Start     Dose/Rate Route Frequency Ordered Stop   05/03/15 1015  vancomycin (VANCOCIN) IVPB 1000 mg/200 mL premix     1,000 mg 200 mL/hr over 60 Minutes Intravenous To Radiology 05/03/15 1004 05/03/15 1807        Subjective:   Tocara Musser was seen and examined today. She states that pain is not still well controlled despite on every 2 hours of Dilaudid. Patient denies dizziness, chest pain, shortness of breath, abdominal pain, N/V/D/C. No acute events overnight.    Objective:   Blood pressure 175/81, pulse 91, temperature 98.2 F (36.8 C), temperature source Oral, resp. rate 18, height 5\' 4"  (1.626 m), weight 77.5 kg (170 lb 13.7 oz), SpO2 100 %.  Wt Readings from Last 3 Encounters:  05/05/15 77.5 kg (170 lb 13.7 oz)  04/06/15 79.379 kg (175 lb)  03/31/15 77.111 kg (170 lb)     Intake/Output Summary (Last 24 hours) at 05/06/15 1142 Last data filed at 05/06/15 0817  Gross per 24 hour  Intake    240 ml  Output   2000 ml  Net  -1760 ml    Exam  General: Alert and oriented 3  HEENT:  PERRLA, EOMI  Neck: Supple, no JVD, no  masses  CVS: S1 S2 clear, RRR  Respiratory: CTAB  Abdomen: Soft, nontender, nondistended, + bowel sounds  Ext: no cyanosis clubbing or edema  Neuro: no new deficits  Skin: No rashes   Psych: alert and oriented 3  Data Review   Micro Results Recent Results (from the past 240 hour(s))  MRSA PCR Screening     Status: None   Collection Time: 05/01/15 11:45 PM  Result Value Ref Range Status   MRSA by PCR NEGATIVE NEGATIVE Final    Comment:        The GeneXpert MRSA Assay (FDA approved for NASAL specimens only), is one component of a comprehensive MRSA  colonization surveillance program. It is not intended to diagnose MRSA infection nor to guide or monitor treatment for MRSA infections.   Surgical pcr screen     Status: None   Collection Time: 05/04/15  5:43 AM  Result Value Ref Range Status   MRSA, PCR NEGATIVE NEGATIVE Final   Staphylococcus aureus NEGATIVE NEGATIVE Final    Comment:        The Xpert SA Assay (FDA approved for NASAL specimens in patients over 48 years of age), is one component of a comprehensive surveillance program.  Test performance has been validated by [facility] for patients greater than or equal to 77 year old. It is not intended to diagnose infection nor to guide or monitor treatment.     Radiology Reports Nm Hepato W/eject Fract  05/02/2015   CLINICAL DATA:  Right upper quadrant pain, cholelithiasis  EXAM: NUCLEAR MEDICINE HEPATOBILIARY IMAGING  TECHNIQUE: Sequential images of the abdomen were obtained out to 60 minutes following intravenous administration of radiopharmaceutical.  RADIOPHARMACEUTICALS:  Five mCi Tc-5m  Choletec IV  COMPARISON:  05/01/2015  FINDINGS: There is normal uptake throughout the liver immediately following injection. Visualization of the biliary tree is noted at 20 minutes with common bile duct activity at 20 minutes as well. Small bowel activity is noted at 25 minutes. The gallbladder is visualized at 70 minutes  with progressive filling over the next 45 minutes.  IMPRESSION: Normal uptake and excretion of biliary tracer.  Right upper Select proper NMHepatobiliary template.   Electronically Signed   By: Inez Catalina M.D.   On: 05/02/2015 16:14   Ir Fluoro Guide Cv Line Right  05/03/2015   INDICATION: 49 year old with end-stage renal disease and starting hemodialysis.  EXAM: FLUOROSCOPIC AND ULTRASOUND GUIDED PLACEMENT OF A TUNNELED DIALYSIS CATHETER  Physician: Stephan Minister. Henn, MD  FLUOROSCOPY TIME:  30 seconds, 11.3 mGy  MEDICATIONS: 1 mg Versed, 25 mcg fentanyl.  1 g vancomycin.  A radiology nurse monitored the patient for moderate sedation.  Vancomycin was given within two hours of incision. Vancomycin was given due to a penicillin allergy.  ANESTHESIA/SEDATION: Moderate sedation time: 30 minutes  PROCEDURE: Informed consent was obtained for placement of a tunneled dialysis catheter. The patient was placed supine on the interventional table. Ultrasound confirmed a patent right internal jugularvein. Ultrasound images were obtained for documentation. The right side of the neck was prepped and draped in a sterile fashion. The right side of the neck neck was anesthetized with 1% lidocaine. Maximal barrier sterile technique was utilized including caps, mask, sterile gowns, sterile gloves, sterile drape, hand hygiene and skin antiseptic. A small incision was made with #11 blade scalpel. A 21 gauge needle directed into the right internal jugular vein with ultrasound guidance. A micropuncture dilator set was placed. A 19 cm tip to cuff Palindrome catheter was selected. The skin below the right clavicle was anesthetized and a small incision was made with an #11 blade scalpel. A subcutaneous tunnel was formed to the vein dermatotomy site. The catheter was brought through the tunnel. The vein dermatotomy site was dilated to accommodate a peel-away sheath. The catheter was placed through the peel-away sheath and directed into the  central venous structures. The tip of the catheter was placed in the right atrium with fluoroscopy. Fluoroscopic images were obtained for documentation. Both lumens were found to aspirate and flush well. The proper amount of heparin was flushed in both lumens. The vein dermatotomy site was closed using a single layer of absorbable suture  and Dermabond. Gel-Foam was placed in the subcutaneous tract. The catheter was secured to the skin using Prolene suture.  FINDINGS: Catheter tip in the right atrium.  Estimated blood loss: Minimal  COMPLICATIONS: None  IMPRESSION: Successful placement of a right jugular tunneled dialysis catheter using ultrasound and fluoroscopic guidance.   Electronically Signed   By: Markus Daft M.D.   On: 05/03/2015 18:06   Ir US Guide Vasc Access Right  05/03/2015   INDICATION: 49 year old with end-stage renal disease and starting hemodialysis.  EXAM: FLUOROSCOPIC AND ULTRASOUND GUIDED PLACEMENT OF A TUNNELED DIALYSIS CATHETER  Physician: Stephan Minister. Henn, MD  FLUOROSCOPY TIME:  30 seconds, 11.3 mGy  MEDICATIONS: 1 mg Versed, 25 mcg fentanyl.  1 g vancomycin.  A radiology nurse monitored the patient for moderate sedation.  Vancomycin was given within two hours of incision. Vancomycin was given due to a penicillin allergy.  ANESTHESIA/SEDATION: Moderate sedation time: 30 minutes  PROCEDURE: Informed consent was obtained for placement of a tunneled dialysis catheter. The patient was placed supine on the interventional table. Ultrasound confirmed a patent right internal jugularvein. Ultrasound images were obtained for documentation. The right side of the neck was prepped and draped in a sterile fashion. The right side of the neck neck was anesthetized with 1% lidocaine. Maximal barrier sterile technique was utilized including caps, mask, sterile gowns, sterile gloves, sterile drape, hand hygiene and skin antiseptic. A small incision was made with #11 blade scalpel. A 21 gauge needle directed into the  right internal jugular vein with ultrasound guidance. A micropuncture dilator set was placed. A 19 cm tip to cuff Palindrome catheter was selected. The skin below the right clavicle was anesthetized and a small incision was made with an #11 blade scalpel. A subcutaneous tunnel was formed to the vein dermatotomy site. The catheter was brought through the tunnel. The vein dermatotomy site was dilated to accommodate a peel-away sheath. The catheter was placed through the peel-away sheath and directed into the central venous structures. The tip of the catheter was placed in the right atrium with fluoroscopy. Fluoroscopic images were obtained for documentation. Both lumens were found to aspirate and flush well. The proper amount of heparin was flushed in both lumens. The vein dermatotomy site was closed using a single layer of absorbable suture and Dermabond. Gel-Foam was placed in the subcutaneous tract. The catheter was secured to the skin using Prolene suture.  FINDINGS: Catheter tip in the right atrium.  Estimated blood loss: Minimal  COMPLICATIONS: None  IMPRESSION: Successful placement of a right jugular tunneled dialysis catheter using ultrasound and fluoroscopic guidance.   Electronically Signed   By: Markus Daft M.D.   On: 05/03/2015 18:06   US Abdomen Limited Ruq  05/01/2015   CLINICAL DATA:  Right upper quadrant abdominal pain. Nausea and vomiting for 5 days. Hepatitis. Chronic kidney disease.  EXAM: US ABDOMEN LIMITED - RIGHT UPPER QUADRANT  COMPARISON:  Renal ultrasound of 11/09/2014.  FINDINGS: Gallbladder:  Multiple gallstones, measuring up to 9 mm. Wall thickening, up to 5 mm. No pericholecystic edema. Murphy's sign not well evaluated; patient pre-medicated.  Common bile duct:  Diameter: Normal, 4 mm.  Liver:  Mildly heterogeneous hepatic echotexture, nonspecific.  Incidental note is made of renal hyperechogenicity.  IMPRESSION: 1. Gallstones with nonspecific gallbladder wall thickening. Sonographic  Percell Miller sign could not the evaluated secondary to patient being pre-medicated. Acute cholecystitis cannot be entirely excluded. CT or nuclear medicine scintigraphy could be informative. 2. Renal echogenicity, suggesting medical renal disease.  Electronically Signed   By: Abigail Miyamoto M.D.   On: 05/01/2015 12:57    CBC  Recent Labs Lab 05/01/15 1112 05/02/15 0330 05/03/15 0430 05/04/15 0424  WBC 3.0* 2.9* 2.4* 2.3*  HGB 11.3* 9.4* 10.0* 9.1*  HCT 34.1* 28.6* 31.7* 28.5*  PLT 176 160 165 154  MCV 82.0 82.7 82.1 81.4  MCH 27.2 27.2 25.9* 26.0  MCHC 33.1 32.9 31.5 31.9  RDW 16.2* 16.4* 16.6* 17.0*  LYMPHSABS 0.5*  --  0.6*  --   MONOABS 0.3  --  0.2  --   EOSABS 0.0  --  0.0  --   BASOSABS 0.0  --  0.0  --     Chemistries   Recent Labs Lab 05/01/15 1112 05/01/15 1647 05/02/15 0330 05/04/15 0424 05/05/15 1646 05/06/15 0612  NA 126* 128* 131* 131* 133* 128*  K 3.5 3.4* 3.4* 3.3* 3.9 5.4*  CL 105 105 108 104 101 100*  CO2 11* 13* 11* 18* 27 21*  GLUCOSE 422* 388* 135* 160* 165* 326*  BUN 69* 65* 61* 47* 15 22*  CREATININE 8.23* 7.78* 8.12* 7.64* 3.85* 5.14*  CALCIUM 8.3* 8.4* 7.6* 7.3* 7.3* 7.0*  AST 47*  --  36 50*  --   --   ALT 64*  --  47 55*  --   --   ALKPHOS 97  --  66 80  --   --   BILITOT 0.6  --  0.5 0.4  --   --    ------------------------------------------------------------------------------------------------------------------ estimated creatinine clearance is 13.5 mL/min (by C-G formula based on Cr of 5.14). ------------------------------------------------------------------------------------------------------------------  Recent Labs  05/05/15 1646  HGBA1C 10.3*   ------------------------------------------------------------------------------------------------------------------ No results for input(s): CHOL, HDL, LDLCALC, TRIG, CHOLHDL, LDLDIRECT in the last 72  hours. ------------------------------------------------------------------------------------------------------------------ No results for input(s): TSH, T4TOTAL, T3FREE, THYROIDAB in the last 72 hours.  Invalid input(s): FREET3 ------------------------------------------------------------------------------------------------------------------ No results for input(s): VITAMINB12, FOLATE, FERRITIN, TIBC, IRON, RETICCTPCT in the last 72 hours.  Coagulation profile  Recent Labs Lab 05/03/15 0908  INR 1.37    No results for input(s): DDIMER in the last 72 hours.  Cardiac Enzymes No results for input(s): CKMB, TROPONINI, MYOGLOBIN in the last 168 hours.  Invalid input(s): CK ------------------------------------------------------------------------------------------------------------------ Invalid input(s): [place]  05/05/15 0754 05/05/15 1441 05/05/15 1638 05/05/15 2039 05/06/15 0813 05/06/15 [place]* 117* 145* 241* 352* 124*     Ethelean Colla M.D. [facility] 05/06/2015, 11:42 AM  Pager: [phone] Between 7am to 7pm - call Pager - [phone]  After 7pm go to [email or web address] - password [email or web address]  Call night coverage person covering after 7pm

## 2015-05-06 NOTE — Progress Notes (Deleted)
[facility] Progress Note  Assessment/Plan: 1. New ESRD sec DM/HTN with hypokalemia - 4 K bath pre Hd K was 3.9 - hold po KCl for now and follow K; need to keep her meds as simple as possible. Laddie Aquas talked with her about dialysis yesterday HD again today 2. Anemia on aranesp Hgb 9.1 9/29 100 Given 9/28 24% sat ferritin 316- replete Fe 3. Sec HPTH iPTH 121 07/2014- repeat iPTH pend-drawn 9/27 4. DM - per primary 5. HTN - titrating volume down 1 L 9/28 and 9.29, 2 L off 9/30 down to 77.5 - simplifiy meds as much as possible- on coreg 25 bid and norvasc 5; still needs volume down 6. Hep C Qual RNA 1270000 7. Gallstones HIDA Ok-LFT sl elevated could also be due to Hep C, liver congestion 8. Jehovah's Witness 9. Bipolar - contributes to her lack of insight; reinforced HD is all or nothing 10. Malnutrition - alb 2- add supplements  Myriam Jacobson, PA-C [facility] Beeper [phone] 05/06/2015,7:32 AM  LOS: 5 days   Subjective:   Sister is Lilla Shook at [facility] - doesn't want to be on the same shift as her.  Doesn't like Nephrologist telling her she has to stay on HD  Objective Filed Vitals:   05/05/15 1455 05/05/15 1724 05/05/15 2041 05/06/15 0408  BP: 172/86 148/72 142/71 130/71  Pulse: 86 95 88 88  Temp: 97.5 F (36.4 C) 98.2 F (36.8 C) 97.8 F (36.6 C) 98.5 F (36.9 C)  TempSrc: Oral Oral    Resp: 18 18 18 18   Height:      Weight: 77.5 kg (170 lb 13.7 oz)     SpO2: 100% 100% 100% 99%   Physical Exam General: NAD Heart: RRR Lungs: no rales Abdomen: soft Extremities:+ LE edema Dialysis Access: right IJ and left AVGG + bruit -mild-mod swelling  Dialysis Orders: new start  Additional Objective Labs: Basic Metabolic Panel:  Recent Labs Lab 05/02/15 0330 05/02/15 0957 05/04/15 0424 05/05/15 1646  NA 131*  --  131* 133*  K 3.4*  --  3.3* 3.9  CL 108  --  104 101  CO2 11*  --  18* 27  GLUCOSE 135*  --  160* 165*  BUN 61*   --  47* 15  CREATININE 8.12*  --  7.64* 3.85*  CALCIUM 7.6*  --  7.3* 7.3*  PHOS  --  4.7*  --  2.3*   Liver Function Tests:  Recent Labs Lab 05/01/15 1112 05/02/15 0330 05/04/15 0424 05/05/15 1646  AST 47* 36 50*  --   ALT 64* 47 55*  --   ALKPHOS 97 66 80  --   BILITOT 0.6 0.5 0.4  --   PROT 7.2 5.5* 5.9*  --   ALBUMIN 2.4* 1.8* 2.0* 2.2*    Recent Labs Lab 05/01/15 1112  LIPASE 70*   CBC:  Recent Labs Lab 05/01/15 1112 05/02/15 0330 05/03/15 0430 05/04/15 0424  WBC 3.0* 2.9* 2.4* 2.3*  NEUTROABS 2.2  --  1.5*  --   HGB 11.3* 9.4* 10.0* 9.1*  HCT 34.1* 28.6* 31.7* 28.5*  MCV 82.0 82.7 82.1 81.4  PLT 176 160 165 154   CBG:  Recent Labs Lab 05/04/15 2059 05/05/15 0754 05/05/15 1441 05/05/15 1638 05/05/15 2039  GLUCAP 221* 314* 117* 145* 241*   IMedications:   . amLODipine  5 mg Oral QHS  . antiseptic oral rinse  7 mL Mouth Rinse q12n4p  . carvedilol  25 mg Oral BID WC  . chlorhexidine  15 mL Mouth Rinse BID  . [START ON 05/10/2015] darbepoetin (ARANESP) injection - DIALYSIS  100 mcg Intravenous Q Wed-HD  . folic acid  0.5 mg Oral Daily  . insulin aspart  0-15 Units Subcutaneous TID WC  . insulin aspart  0-5 Units Subcutaneous QHS  . insulin aspart  3 Units Subcutaneous TID WC  . insulin glargine  15 Units Subcutaneous QHS  . levETIRAcetam  500 mg Oral Q12H  . multivitamin  1 tablet Oral QHS  . potassium chloride  40 mEq Oral Daily  . white petrolatum

## 2015-05-07 DIAGNOSIS — F3174 Bipolar disorder, in full remission, most recent episode manic: Secondary | ICD-10-CM

## 2015-05-07 DIAGNOSIS — E1121 Type 2 diabetes mellitus with diabetic nephropathy: Secondary | ICD-10-CM

## 2015-05-07 LAB — BASIC METABOLIC PANEL
Anion gap: 7 (ref 5–15)
BUN: 15 mg/dL (ref 6–20)
CALCIUM: 7.3 mg/dL — AB (ref 8.9–10.3)
CO2: 25 mmol/L (ref 22–32)
Chloride: 99 mmol/L — ABNORMAL LOW (ref 101–111)
Creatinine, Ser: 3.69 mg/dL — ABNORMAL HIGH (ref 0.44–1.00)
GFR calc Af Amer: 16 mL/min — ABNORMAL LOW (ref >60)
GFR, EST NON AFRICAN AMERICAN: 14 mL/min — AB (ref >60)
GLUCOSE: 258 mg/dL — AB (ref 65–99)
Potassium: 3.7 mmol/L (ref 3.5–5.1)
Sodium: 131 mmol/L — ABNORMAL LOW (ref 135–145)

## 2015-05-07 LAB — GLUCOSE, CAPILLARY
Glucose-Capillary: 230 mg/dL — ABNORMAL HIGH (ref 65–99)
Glucose-Capillary: 229 mg/dL — ABNORMAL HIGH (ref 65–99)

## 2015-05-07 MED ORDER — INSULIN GLARGINE 100 UNIT/ML ~~LOC~~ SOLN: 25.0000 [IU] | Freq: Every day | SUBCUTANEOUS | Status: DC

## 2015-05-07 MED ORDER — INSULIN ASPART 100 UNIT/ML ~~LOC~~ SOLN: 2.0000 [IU] | Freq: Three times a day (TID) | SUBCUTANEOUS | Status: DC

## 2015-05-07 MED ORDER — OXYCODONE HCL 5 MG PO TABS: 5.0000 mg | ORAL_TABLET | Freq: Four times a day (QID) | ORAL | Status: DC | PRN

## 2015-05-07 MED ORDER — DIPHENHYDRAMINE HCL 25 MG PO CAPS: 25.0000 mg | ORAL_CAPSULE | Freq: Four times a day (QID) | ORAL | Status: DC | PRN

## 2015-05-07 MED ORDER — OXYCODONE HCL 5 MG PO TABS: 5.0000 mg | ORAL_TABLET | ORAL | Status: DC | PRN

## 2015-05-07 MED ORDER — PROMETHAZINE HCL 12.5 MG PO TABS: 12.5000 mg | ORAL_TABLET | Freq: Four times a day (QID) | ORAL | Status: DC | PRN

## 2015-05-07 MED ORDER — INSULIN GLARGINE 100 UNIT/ML ~~LOC~~ SOLN
20.0000 [IU] | Freq: Every day | SUBCUTANEOUS | Status: DC
  Filled 2015-05-07: qty 0.2

## 2015-05-07 MED ORDER — AMLODIPINE BESYLATE 5 MG PO TABS: 5.0000 mg | ORAL_TABLET | Freq: Every day | ORAL | Status: DC

## 2015-05-07 MED ORDER — CARVEDILOL 25 MG PO TABS: 25.0000 mg | ORAL_TABLET | Freq: Two times a day (BID) | ORAL | Status: DC

## 2015-05-07 MED ORDER — ATORVASTATIN CALCIUM 40 MG PO TABS: 40.0000 mg | ORAL_TABLET | Freq: Every day | ORAL | Status: DC

## 2015-05-07 MED ORDER — INSULIN ASPART 100 UNIT/ML ~~LOC~~ SOLN
5.0000 [IU] | Freq: Three times a day (TID) | SUBCUTANEOUS | Status: DC
  Administered 2015-05-07 (×2): 5 [IU] via SUBCUTANEOUS

## 2015-05-07 NOTE — Care Management Note (Signed)
Case Management Note  Patient Details  Name: [name] MRN: [id number] Date of Birth: 09/23/1965  Subjective/Objective:                    Action/Plan: CM consulted concerning assistance with transportation for Tuesday 10/4 at 10:30 for initial HD treatment at [facility]. Met with patient she states, that she does not know how to get there. 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 Patient states, she is living with her aunt, records update with address and phone number, Discussed Medicaid Transportation [phone], patient states, she was unaware of the services. CM contacted [facility] Transportation via email regarding pick up for 10/4 for10:30 appt at HD patient also given information to contact the office on Monday to confirm. Patient also states, that she does not have the funds to get her medication,but she states she has enough until next. CM explained to patient that she would not be eligible for any medication assistance, but she could speak with her Pharmacy and ask to waive copay until her funds are available. CM call patient pharmacy [facility] on [address], spoke with Ronalee Belts he would be willing to assist her. Informed patient of this information. Patient verbalized understanding, and appreciation teach back done.  Updated Carlyn Reichert RN on 6E.  Patient will be transported home via private vehicle. No further CM needs identified.  Expected Discharge Date:   (unknown)               Expected Discharge Plan:  Home/Self Care  In-House Referral:     Discharge planning Services  CM Consult  Post Acute Care Choice:    Choice offered to:     DME Arranged:    DME Agency:     HH Arranged:    [place] Agency:     Status of Service:  Completed, signed off  Medicare Important Message Given:  Yes-second notification given Date Medicare IM Given:    Medicare IM give by:    Date Additional Medicare IM Given:    Additional Medicare Important Message give by:     If discussed at [place] of Stay Meetings, dates discussed:    Additional CommentsLaurena Slimmer, RN 05/07/2015, 4:05 PM

## 2015-05-07 NOTE — Discharge Summary (Signed)
Physician Discharge Summary   Patient ID: [name] MRN: [id number] DOB/AGE: September 20, 1965 49 y.o.  Admit date: 05/01/2015 Discharge date: 05/07/2015  Primary Care Physician:  [name], MD  Discharge Diagnoses:    . New end-stage renal disease, started on hemodialysis. . Hypertensive urgency . Hep C w/o coma, chronic ([place]) . Bipolar disorder ([place]) . Chronic combined systolic and diastolic CHF (congestive heart failure) ([place]) . Diabetes mellitus with nephropathy ([place]) . Hypokalemia . RUQ abdominal pain . Hypertensive urgency . Acute hyponatremia . Leukopenia . Anemia of chronic kidney disease  . Bipolar 2 disorder [facility])   Consults:  Nephrology, Dr. Roni Bread. surgery Vascular surgery  Recommendations for Outpatient Follow-up:  Patient has outpatient hemodialysis spot available, start at [facility] 10:30 am Tuesday and then MWF schedule.  Clonidine has been discontinued. As patient has been started on new hemodialysis, high antihypertensives will need to be titrated.  Lantus is decreased to 25 units daily (was taking 36 units daily.) Continue sliding scale insulin. Insulin regimen will also need titration.  Patient needs to be followed by pain clinic.    Vascular surgery placed left brachial forearm AV graft on 9/29.  TESTS THAT NEED FOLLOW-UP BMET, CBC   DIET: carb Modified diet    Allergies:   Allergies  Allergen Reactions  . Gabapentin Itching and Swelling  . Gabapentin Swelling  . Lyrica [Pregabalin] Swelling    Facial and leg swelling  . Other     NO BLOOD PRODUCTS. PT IS A JEHOVAH WITNESS  . Penicillins Hives    Pt tolerates cefepime and ceftriaxone  . Saphris [Asenapine] Swelling    Swelling of the face  . Saphris [Asenapine] Swelling    Facial swelling  . Tramadol Swelling    Causes legs to swell  . Tramadol Swelling    Leg swelling  . Penicillins Hives    Has tolerated Cefepime and Ceftriaxone.     Discharge Medications:    Medication List    STOP taking these medications        cloNIDine 0.1 MG tablet  Commonly known as:  CATAPRES     cloNIDine 0.2 MG tablet  Commonly known as:  CATAPRES     furosemide 40 MG tablet  Commonly known as:  LASIX     oxyCODONE-acetaminophen 5-325 MG tablet  Commonly known as:  PERCOCET      TAKE these medications        ACCU-CHEK AVIVA PLUS W/DEVICE Kit  1 each by Does not apply route 4 (four) times daily - after meals and at bedtime.     accu-chek softclix lancets  Use as instructed     amLODipine 5 MG tablet  Commonly known as:  NORVASC  Take 1 tablet (5 mg total) by mouth at bedtime.     aspirin 325 MG EC tablet  Take 1 tablet (325 mg total) by mouth daily.     atorvastatin 40 MG tablet  Commonly known as:  LIPITOR  Take 1 tablet (40 mg total) by mouth at bedtime.     carvedilol 25 MG tablet  Commonly known as:  COREG  Take 1 tablet (25 mg total) by mouth 2 (two) times daily.     diphenhydrAMINE 25 mg capsule  Commonly known as:  BENADRYL  Take 1 capsule (25 mg total) by mouth every 6 (six) hours as needed for itching.     ferrous sulfate 325 (65 FE) MG tablet  Take 1 tablet (325 mg total) by mouth  daily with breakfast.     folic acid 423 MCG tablet  Commonly known as:  FOLVITE  Take 200 mcg by mouth every morning.     glucose blood test strip  Commonly known as:  ACCU-CHEK AVIVA PLUS  Use three times before meals and at bedtime     insulin aspart 100 UNIT/ML injection  Commonly known as:  novoLOG  Inject 2-10 Units into the skin 3 (three) times daily before meals. SSI: 0-200 = 2 units, 201-250 = 4 units, 251-300 = 6 units, 301-350 = 8 units, 351-400 = 10 units     insulin glargine 100 UNIT/ML injection  Commonly known as:  LANTUS  Inject 0.25 mLs (25 Units total) into the skin at bedtime.     lactulose 10 GM/15ML solution  Commonly known as:  CHRONULAC  Take 15 mLs (10 g total) by mouth 3 (three) times daily.     levETIRAcetam 500 MG  tablet  Commonly known as:  KEPPRA  Take 500 mg by mouth every 12 (twelve) hours.     multivitamin with minerals Tabs tablet  Take 1 tablet by mouth daily.     mupirocin cream 2 %  Commonly known as:  BACTROBAN  Apply topically daily.     oxyCODONE 5 MG immediate release tablet  Commonly known as:  Oxy IR/ROXICODONE  Take 1-2 tablets (5-10 mg total) by mouth every 6 (six) hours as needed for moderate pain or severe pain.     pantoprazole 40 MG tablet  Commonly known as:  PROTONIX  Take 1 tablet (40 mg total) by mouth 2 (two) times daily.     polyethylene glycol packet  Commonly known as:  MIRALAX / GLYCOLAX  Take 17 g by mouth 2 (two) times daily. Mix in 8 oz liquid and drink     promethazine 12.5 MG tablet  Commonly known as:  PHENERGAN  Take 1 tablet (12.5 mg total) by mouth every 6 (six) hours as needed for nausea or vomiting.         Brief H and P: For complete details please refer to admission H and P, but in brief Patient is 49 year old female with multiple medical conditions including chronic kidney disease stage V with associated anemia of chronic disease and iron deficiency, uncontrolled diabetes mellitus, hypertension, bipolar disorder and schizoaffective disorder, hepatitis C, has refused dialysis in the past. Patient subsequently presented to [facility] emergency department on 9/26 with main concern of several days duration of progressively worsening right upper quadrant abdominal pain, constant and throbbing, 7 out of 10 in severity when present, radiating to entire abdominal area, worse with eating and with no specific alleviating factors. No fevers or chills.  In emergency department, blood pressure noted to be significantly elevated in 200s. blood work notable for WBC 3.0, Hg 11.3, sodium 126, bicarbonate 11, creatinine 8.23, glucose 422. Imaging studies notable for gallstones and nonspecific gallbladder wall thickening. Nephrology and surgery team consulted.  Patient was admitted to stepdown unit for further workup.   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 Patient was admitted on 09/26 with abdominal pain and nausea vomiting for 5 days prior to admission. She did admit to having issues with nausea for weeks along with increasing fatigue. She had not followed up with the nephrology since May 16 when her creatinine  was around 5, now admitted with creatinine of 8.12. Patient was willing to do hemodialysis this time. Nephrology and interventional radiology was consulted. Patient underwent tunneled HD catheter placement on 9/28 by IR. Vascular surgery was consulted and patient underwent left brachial forearm AV graft placement on 9/29. - CLIP process was started by nephrology. Patient has a spot at [facility] for Monday Wednesday and Friday, however this week start 10:30 AM Tuesday.  RUQ abdominal pain -Resolved. Patient is tolerating solid diet without any difficulty. HIDA scan was normal. --Gen Surgery was consulted however recommended that there is no indication for cholecystectomy or any surgical intervention at this time.   Chronic pain issues, pain in the left arm Patient appears to have chronic pain issues and per her statement, she followed pain clinic which was closed. He subsequently she has appointment in new pain clinic in a month. Patient was given prescription for oxycodone 5 mg (#45 tablets with no refills) to last until her pain clinic appointment.  Diabetes mellitus with nephropathy uncontrolled - Continue sliding scale insulin and Lantus 25 units daily. Titrate as per the blood sugar readings.  Hypertensive urgency - BP has improved. Patient was continued on Norvasc and Coreg. Since patient is started on hemodialysis now, clonidine has been discontinued. This will be followed closely  by nephrology at the hemodialysis center.  Acute hyponatremia -Na improving, likely due to decompensated CHF w/ ESRD  Chronic combined systolic and diastolic CHF (congestive heart failure) volume control per HD   Leukopenia -chronic and at baseline   Hep C  Bipolar disorder -stable mental status   Anemia of chronic kidney failure -No signs of active bleeding - Hgb stable   History of seizures  Continue Keppra    Day of Discharge BP 157/70 mmHg  Pulse 96  Temp(Src) 99 F (37.2 C) (Oral)  Resp 18  Ht 5' 4" (1.626 m)  Wt 77.7 kg (171 lb 4.8 oz)  BMI 29.39 kg/m2  SpO2 100%  Physical Exam: General: Alert and awake oriented x3 not in any acute distress. HEENT: anicteric sclera, pupils reactive to light and accommodation CVS: S1-S2 clear no murmur rubs or gallops Chest: clear to auscultation bilaterally, no wheezing rales or rhonchi Abdomen: soft nontender, nondistended, normal bowel sounds Extremities: no cyanosis, clubbing, 1+ edema noted bilaterally. Left lower AV fistula +bruit Neuro: Cranial nerves II-XII intact, no focal neurological deficits   The results of significant diagnostics from this hospitalization (including imaging, microbiology, ancillary and laboratory) are listed below for reference.    LAB RESULTS: Basic Metabolic Panel:  Recent Labs Lab 05/06/15 0612 05/07/15 0850  NA 128* 131*  K 5.4* 3.7  CL 100* 99*  CO2 21* 25  GLUCOSE 326* 258*  BUN 22* 15  CREATININE 5.14* 3.69*  CALCIUM 7.0* 7.3*  PHOS 3.4  --    Liver Function Tests:  Recent Labs Lab 05/02/15 0330 05/04/15 0424 05/05/15 1646 05/06/15 0612  AST 36 50*  --   --   ALT 47 55*  --   --   ALKPHOS 66 80  --   --   BILITOT 0.5 0.4  --   --   PROT 5.5* 5.9*  --   --   ALBUMIN 1.8* 2.0* 2.2* 2.0*    Recent Labs Lab 05/01/15 1112  LIPASE 70*   No results for input(s): AMMONIA in the last 168 hours. CBC:  Recent Labs Lab 05/03/15 0430 05/04/15 0424  WBC 2.4*  2.3*  NEUTROABS 1.5*  --  HGB 10.0* 9.1*  HCT 31.7* 28.5*  MCV 82.1 81.4  PLT 165 154   Cardiac Enzymes: No results for input(s): CKTOTAL, CKMB, CKMBINDEX, TROPONINI in the last 168 hours. BNP: Invalid input(s): POCBNP CBG:  Recent Labs Lab 05/06/15 2105 05/07/15 0728  GLUCAP 160* 230*    Significant Diagnostic Studies:  Nm Hepato W/eject Fract  05/02/2015   CLINICAL DATA:  Right upper quadrant pain, cholelithiasis  EXAM: NUCLEAR MEDICINE HEPATOBILIARY IMAGING  TECHNIQUE: Sequential images of the abdomen were obtained out to 60 minutes following intravenous administration of radiopharmaceutical.  RADIOPHARMACEUTICALS:  Five mCi Tc-67m Choletec IV  COMPARISON:  05/01/2015  FINDINGS: There is normal uptake throughout the liver immediately following injection. Visualization of the biliary tree is noted at 20 minutes with common bile duct activity at 20 minutes as well. Small bowel activity is noted at 25 minutes. The gallbladder is visualized at 70 minutes with progressive filling over the next 45 minutes.  IMPRESSION: Normal uptake and excretion of biliary tracer.  Right upper Select proper NMHepatobiliary template.   Electronically Signed   By: MInez CatalinaM.D.   On: 05/02/2015 16:14   [place]domen Limited Ruq  05/01/2015   CLINICAL DATA:  Right upper quadrant abdominal pain. Nausea and vomiting for 5 days. Hepatitis. Chronic kidney disease.  EXAM: [place]DOMEN LIMITED - RIGHT UPPER QUADRANT  COMPARISON:  Renal ultrasound of 11/09/2014.  FINDINGS: Gallbladder:  Multiple gallstones, measuring up to 9 mm. Wall thickening, up to 5 mm. No pericholecystic edema. Murphy's sign not well evaluated; patient pre-medicated.  Common bile duct:  Diameter: Normal, 4 mm.  Liver:  Mildly heterogeneous hepatic echotexture, nonspecific.  Incidental note is made of renal hyperechogenicity.  IMPRESSION: 1. Gallstones with nonspecific gallbladder wall thickening. Sonographic MPercell Millersign could not the evaluated  secondary to patient being pre-medicated. Acute cholecystitis cannot be entirely excluded. CT or nuclear medicine scintigraphy could be informative. 2. Renal echogenicity, suggesting medical renal disease.   Electronically Signed   By: KAbigail MiyamotoM.D.   On: 05/01/2015 12:57    2D ECHO:   Disposition and Follow-up: Discharge Instructions    Diet Carb Modified    Complete by:  As directed      Discharge instructions    Complete by:  As directed   Please titrate your lantus according to your blood sugar readings at home.   It is VERY IMPORTANT that you follow up with a PCP on a regular basis.  Check your blood glucoses before each meal and at bedtime and maintain a log of your readings.  Bring this log with you when you follow up with your PCP so that he or she can adjust your insulin at your follow up visit.   You have a dialysis port available[facility] and then MWF schedule.     Increase activity slowly    Complete by:  As directed             DISPOSITION: home    DISCHARGE FOLLOW-UP     Follow-up Information    Follow up with WEBB, CAROL D, MD. Schedule an appointment as soon as possible for a visit in 10 days.   Specialty:  Family Medicine   Why:  for hospital follow-up.    Contact information:   [phone]       Time spent on Discharge: 35 mins   Signed:   RAI,RIPUDEEP M.D. [facility] 05/07/2015, 9:57 AM Pager: [phone]

## 2015-05-07 NOTE — Progress Notes (Signed)
Patient Discharge:  Disposition: Pt discharged home with family  Education: Pt educated on medications, follow up appointments, and all discharge instructions. Pt verbalized understanding.   IV: Removed  Telemetry: N/A  Follow-up appointments:Reviewed with pt.  Prescriptions: Scripts given to pt  Transportation: Pt transported home by family  Belongings: All belongings taken with pt.

## 2015-05-07 NOTE — Progress Notes (Signed)
[facility] Progress Note  Assessment/Plan: 1. New ESRD sec DM/HTN with hypokalemia - K 5.4 yesterday - KCl stopped; need to keep her meds as simple as possible. Laddie Aquas talked with her about dialysis Friday.  D/c plan start at [facility] 10:30 am Tuesday and then MWF schedule. Her sister Layloni Albaugh is on HD at [facility] MWF1 2. Anemia on aranesp Hgb 9.1 9/29 100 Given 9/28 24% sat ferritin 316- replete Fe 3. Sec HPTH iPTH 121 07/2014- repeat iPTH pend-drawn 9/27 4. DM - per primary 5. HTN - titrating volume down 1 L 9/28 and 9.29, 2 L off 9/30 down to 77.5 1.9 off 10/1 to 77.7- simplifiy meds as much as possible- on coreg 25 bid and norvasc 5; - continue to lower volume on HD - if BP becomes an issue acn ^ norvasc or add back clonidine. Seems high IDWG- will need repetitive couseling after d/c. 6. Hep C Qual RNA 1270000 7. Gallstones HIDA Ok-LFT sl elevated could also be due to Hep C, liver congestion 8. Jehovah's Witness 9. Bipolar - contributes to her lack of insight; reinforced HD is all or nothing 10. Malnutrition - alb 2- add supplements 11. Disp - d/c today  Myriam Jacobson, PA-C [place] [phone] 05/07/2015,8:23 AM  LOS: 6 days  I have seen and examined this patient and agree with plan per Amalia Hailey.  She feels much better and is ready for DC from renal standpoint. Lauranne Beyersdorf T,MD 05/07/2015 8:51 AM Subjective:   Dialysis went well last night. Slept through it.  Objective Filed Vitals:   05/07/15 0600 05/07/15 0618 05/07/15 0640 05/07/15 0700  BP: 164/82 152/82 152/82 157/70  Pulse: 93 93 86 96  Temp:  98.8 F (37.1 C)  99 F (37.2 C)  TempSrc:  Oral  Oral  Resp: 20 18 20 18   Height:      Weight:   77.7 kg (171 lb 4.8 oz)   SpO2:  99%  100%   Physical Exam General: good spirits Heart: RRR Lungs: no rales Abdomen: soft NT Extremities: 1+ LEedema  Dialysis Access:  Left lower AVF + bruit - arm swelling seems a  little better today right IJ  Dialysis Orders: new start  Additional Objective Labs: Basic Metabolic Panel:  Recent Labs Lab 05/02/15 0957 05/04/15 0424 05/05/15 1646 05/06/15 0612  NA  --  131* 133* 128*  K  --  3.3* 3.9 5.4*  CL  --  104 101 100*  CO2  --  18* 27 21*  GLUCOSE  --  160* 165* 326*  BUN  --  47* 15 22*  CREATININE  --  7.64* 3.85* 5.14*  CALCIUM  --  7.3* 7.3* 7.0*  PHOS 4.7*  --  2.3* 3.4   Liver Function Tests:  Recent Labs Lab 05/01/15 1112 05/02/15 0330 05/04/15 0424 05/05/15 1646 05/06/15 0612  AST 47* 36 50*  --   --   ALT 64* 47 55*  --   --   ALKPHOS 97 66 80  --   --   BILITOT 0.6 0.5 0.4  --   --   PROT 7.2 5.5* 5.9*  --   --   ALBUMIN 2.4* 1.8* 2.0* 2.2* 2.0*    Recent Labs Lab 05/01/15 1112  LIPASE 70*   CBC:  Recent Labs Lab 05/01/15 1112 05/02/15 0330 05/03/15 0430 05/04/15 0424  WBC 3.0* 2.9* 2.4* 2.3*  NEUTROABS 2.2  --  1.5*  --   HGB  11.3* 9.4* 10.0* 9.1*  HCT 34.1* 28.6* 31.7* 28.5*  MCV 82.0 82.7 82.1 81.4  PLT 176 160 165 154   CBG:  Recent Labs Lab 05/06/15 0813 05/06/15 1129 05/06/15 1704 05/06/15 2105 05/07/15 0728  GLUCAP 352* 124* 131* 160* 230*   Medications:   . amLODipine  5 mg Oral QHS  . antiseptic oral rinse  7 mL Mouth Rinse q12n4p  . carvedilol  25 mg Oral BID WC  . chlorhexidine  15 mL Mouth Rinse BID  . [START ON 05/10/2015] darbepoetin (ARANESP) injection - DIALYSIS  100 mcg Intravenous Q Wed-HD  . docusate sodium  100 mg Oral BID  . folic acid  0.5 mg Oral Daily  . insulin aspart  0-15 Units Subcutaneous TID WC  . insulin aspart  0-5 Units Subcutaneous QHS  . insulin aspart  5 Units Subcutaneous TID WC  . insulin glargine  20 Units Subcutaneous QHS  . levETIRAcetam  500 mg Oral Q12H  . multivitamin  1 tablet Oral QHS

## 2015-05-09 ENCOUNTER — Inpatient Hospital Stay (HOSPITAL_COMMUNITY)
Admission: EM | Admit: 2015-05-09 | Discharge: 2015-05-12 | DRG: 444 | Disposition: A | Discharge status: Home / Self Care | Payer: Medicare Other | Attending: Internal Medicine | Admitting: Internal Medicine

## 2015-05-09 ENCOUNTER — Encounter (HOSPITAL_COMMUNITY): Payer: Self-pay | Admitting: Emergency Medicine

## 2015-05-09 DIAGNOSIS — I5042 Chronic combined systolic (congestive) and diastolic (congestive) heart failure: Secondary | ICD-10-CM | POA: Diagnosis present

## 2015-05-09 DIAGNOSIS — D61818 Other pancytopenia: Secondary | ICD-10-CM | POA: Diagnosis present

## 2015-05-09 DIAGNOSIS — R109 Unspecified abdominal pain: Secondary | ICD-10-CM

## 2015-05-09 DIAGNOSIS — H409 Unspecified glaucoma: Secondary | ICD-10-CM | POA: Diagnosis present

## 2015-05-09 DIAGNOSIS — Z531 Procedure and treatment not carried out because of patient's decision for reasons of belief and group pressure: Secondary | ICD-10-CM | POA: Diagnosis present

## 2015-05-09 DIAGNOSIS — M79632 Pain in left forearm: Secondary | ICD-10-CM

## 2015-05-09 DIAGNOSIS — Z8673 Personal history of transient ischemic attack (TIA), and cerebral infarction without residual deficits: Secondary | ICD-10-CM

## 2015-05-09 DIAGNOSIS — R1011 Right upper quadrant pain: Secondary | ICD-10-CM | POA: Diagnosis not present

## 2015-05-09 DIAGNOSIS — Z794 Long term (current) use of insulin: Secondary | ICD-10-CM

## 2015-05-09 DIAGNOSIS — E11649 Type 2 diabetes mellitus with hypoglycemia without coma: Secondary | ICD-10-CM | POA: Diagnosis present

## 2015-05-09 DIAGNOSIS — E1122 Type 2 diabetes mellitus with diabetic chronic kidney disease: Secondary | ICD-10-CM | POA: Diagnosis present

## 2015-05-09 DIAGNOSIS — G40909 Epilepsy, unspecified, not intractable, without status epilepticus: Secondary | ICD-10-CM

## 2015-05-09 DIAGNOSIS — I132 Hypertensive heart and chronic kidney disease with heart failure and with stage 5 chronic kidney disease, or end stage renal disease: Secondary | ICD-10-CM | POA: Diagnosis present

## 2015-05-09 DIAGNOSIS — E1140 Type 2 diabetes mellitus with diabetic neuropathy, unspecified: Secondary | ICD-10-CM | POA: Diagnosis present

## 2015-05-09 DIAGNOSIS — N2581 Secondary hyperparathyroidism of renal origin: Secondary | ICD-10-CM | POA: Diagnosis present

## 2015-05-09 DIAGNOSIS — K8050 Calculus of bile duct without cholangitis or cholecystitis without obstruction: Principal | ICD-10-CM | POA: Diagnosis present

## 2015-05-09 DIAGNOSIS — E785 Hyperlipidemia, unspecified: Secondary | ICD-10-CM | POA: Diagnosis present

## 2015-05-09 DIAGNOSIS — N189 Chronic kidney disease, unspecified: Secondary | ICD-10-CM

## 2015-05-09 DIAGNOSIS — N186 End stage renal disease: Secondary | ICD-10-CM | POA: Diagnosis present

## 2015-05-09 DIAGNOSIS — F319 Bipolar disorder, unspecified: Secondary | ICD-10-CM | POA: Diagnosis present

## 2015-05-09 DIAGNOSIS — T383X5A Adverse effect of insulin and oral hypoglycemic [antidiabetic] drugs, initial encounter: Secondary | ICD-10-CM | POA: Diagnosis present

## 2015-05-09 DIAGNOSIS — D631 Anemia in chronic kidney disease: Secondary | ICD-10-CM | POA: Diagnosis present

## 2015-05-09 DIAGNOSIS — F259 Schizoaffective disorder, unspecified: Secondary | ICD-10-CM | POA: Diagnosis present

## 2015-05-09 DIAGNOSIS — E160 Drug-induced hypoglycemia without coma: Secondary | ICD-10-CM | POA: Diagnosis present

## 2015-05-09 DIAGNOSIS — E871 Hypo-osmolality and hyponatremia: Secondary | ICD-10-CM | POA: Diagnosis present

## 2015-05-09 DIAGNOSIS — Z91199 Patient's noncompliance with other medical treatment and regimen due to unspecified reason: Secondary | ICD-10-CM

## 2015-05-09 DIAGNOSIS — K5900 Constipation, unspecified: Secondary | ICD-10-CM | POA: Diagnosis present

## 2015-05-09 DIAGNOSIS — Z9119 Patient's noncompliance with other medical treatment and regimen: Secondary | ICD-10-CM

## 2015-05-09 DIAGNOSIS — B182 Chronic viral hepatitis C: Secondary | ICD-10-CM | POA: Diagnosis present

## 2015-05-09 DIAGNOSIS — F419 Anxiety disorder, unspecified: Secondary | ICD-10-CM | POA: Diagnosis present

## 2015-05-09 DIAGNOSIS — Z87891 Personal history of nicotine dependence: Secondary | ICD-10-CM

## 2015-05-09 DIAGNOSIS — E119 Type 2 diabetes mellitus without complications: Secondary | ICD-10-CM

## 2015-05-09 DIAGNOSIS — K210 Gastro-esophageal reflux disease with esophagitis: Secondary | ICD-10-CM | POA: Diagnosis present

## 2015-05-09 DIAGNOSIS — Z992 Dependence on renal dialysis: Secondary | ICD-10-CM

## 2015-05-09 DIAGNOSIS — K649 Unspecified hemorrhoids: Secondary | ICD-10-CM | POA: Diagnosis present

## 2015-05-09 DIAGNOSIS — K859 Acute pancreatitis without necrosis or infection, unspecified: Secondary | ICD-10-CM

## 2015-05-09 DIAGNOSIS — N185 Chronic kidney disease, stage 5: Secondary | ICD-10-CM

## 2015-05-09 DIAGNOSIS — E1121 Type 2 diabetes mellitus with diabetic nephropathy: Secondary | ICD-10-CM | POA: Diagnosis present

## 2015-05-09 DIAGNOSIS — Z7982 Long term (current) use of aspirin: Secondary | ICD-10-CM

## 2015-05-09 DIAGNOSIS — E1165 Type 2 diabetes mellitus with hyperglycemia: Secondary | ICD-10-CM | POA: Diagnosis present

## 2015-05-09 HISTORY — DX: End stage renal disease: Z99.2

## 2015-05-09 HISTORY — DX: Dependence on renal dialysis: N18.6

## 2015-05-09 NOTE — ED Provider Notes (Signed)
CSN: [id number]     Arrival date & time 05/09/15  2152 History   First MD Initiated Contact with Patient 05/09/15 2216     Chief Complaint  Patient presents with  . Vascular Access Problem     (Consider location/radiation/quality/duration/timing/severity/associated sxs/prior Treatment) HPI  This is a 49 year old female with a history of bipolar disorder, Jehovah's Witness and refusal of blood transfusions, insulin-dependent diabetes, renal failure, just of heart failure, who presents today stating that she wants her hemodialysis fistula and central access removed. She states that she was in the hospital last week for right upper quadrant pain. She states she was told that she had kidney stones and needed to be dialyzed or to be stabilized for surgery. She states that they put the hemodialysis this catheter in and the AV fistula in her left arm without her knowing they're going to put the AV fistula in. She states she has had increased pain and swelling in left arm. She states that she has continued to have nausea, vomiting, and right upper quadrant pain that she had before. She states that she was scheduled to go for outpatient dialysis for the first time today but did not go and now does not want to have dialysis. She reports just being discharged 2 days ago and states the pain and swelling in the left arm have worsened in that time. She has not had fevers. She states that the nausea and right upper quadrant pain never really got better but she has not eaten much all the hospital. She describes pain as ongoing and the nausea and vomiting as worsening.  Past Medical History  Diagnosis Date  . Neuropathy ([place])   . Glaucoma   . Bipolar 1 disorder ([place])   . Refusal of blood transfusions as patient is Jehovah's Witness   . Shortness of breath   . TIA (transient ischemic attack) 2010  . Neuromuscular disorder ([place])     DIABETIC NEUROPATHY  . MRSA infection ?2014    "on the back of my head; spread  throughout my bloodstream"  . Bipolar disorder, unspecified ([place]) 02/06/2014  . High cholesterol   . 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 Hepatitis C   . Chronic pain syndrome   . Chronic kidney disease (CKD), stage III (moderate)   . Schizoaffective disorder, unspecified condition 02/08/2014  . Seizure [facility]) dx'd 2014    "don't know what kind; last one was ~ 04/2014"  . Chronic combined systolic and diastolic CHF (congestive heart failure) (HCC)     EF 40% by echo and 48% by stress test  . Anemia   . Hypertension   . Stroke ([place])     hx TIA  . Neuromuscular disorder ([place])     diabetic neuropathy  . Shortness of breath dyspnea   . Schizophrenia ([place])   . Seizures ([place])   . Hepatitis     hep C  . Bipolar disorder ([place])   . Glaucoma   . Chronic pain   . Diabetes mellitus without complication (HCC)     on Insulin  . Depression   . Anxiety   . Chronic kidney disease     chronic kidney stage 3 Dr Mercy Moore  . GERD (gastroesophageal reflux disease)   . History of hiatal hernia   . Headache     sinus headaches  . Pancytopenia [facility])    Past Surgical History  Procedure Laterality Date  . Amputation Left 05/21/2013  Procedure: Left Midfoot AMPUTATION;  Surgeon: Newt Minion, MD;  Location: [facility];  Service: Orthopedics;  Laterality: Left;  Left Midfoot Amputation  . Tubal ligation    . Colonoscopy with propofol N/A 06/22/2013    Procedure: COLONOSCOPY WITH PROPOFOL;  Surgeon: Jeryl Columbia, MD;  Location: [facility] END[facility];  Service: Endoscopy;  Laterality: N/A;  . Abdominal hysterectomy  2014  . Tonsillectomy  1970's  . Refractive surgery Bilateral 2013  . Tubal ligation  2000  . Abdominal hysterectomy  2010  . Foot amputation Left     due diabetic neuropathy, could not feel ulcer on bottom of foot  . Eye surgery Bilateral 2010    Lasik  . Av fistula placement Left 05/04/2015    Procedure: ARTERIOVENOUS (AV) GRAFT INSERTION;  Surgeon: Angelia Mould,  MD;  Location: [facility] OR;  Service: Vascular;  Laterality: Left;   Family History  Problem Relation Age of Onset  . Hypertension Mother   . Hypertension Father    Social History  Substance Use Topics  . Smoking status: Former Smoker -- 1.00 packs/day for 20 years    Types: Cigarettes    Quit date: 03/31/2015  . Smokeless tobacco: Never Used     Comment: "stopped smoking in ~ 2013; restarted last week; stopped again 06/17/2014"  . Alcohol Use: No     Comment: 05/01/15 patient denies alcohol use.   OB History    Gravida Para Term Preterm AB TAB SAB Ectopic Multiple Living   0 0 0 0 0 0 0 0       Review of Systems  All other systems reviewed and are negative.     Allergies  Gabapentin; Gabapentin; Lyrica; Other; Penicillins; Saphris; Saphris; Tramadol; Tramadol; and Penicillins  Home Medications   Prior to Admission medications   Medication Sig Start Date End Date Taking? Authorizing Provider  amLODipine (NORVASC) 5 MG tablet Take 1 tablet (5 mg total) by mouth at bedtime. 05/07/15   Ripudeep Krystal Eaton, MD  aspirin EC 325 MG EC tablet Take 1 tablet (325 mg total) by mouth daily. Patient not taking: Reported on 05/01/2015 08/27/14   Geradine Girt, DO  atorvastatin (LIPITOR) 40 MG tablet Take 1 tablet (40 mg total) by mouth at bedtime. 05/07/15   Ripudeep Krystal Eaton, MD  Blood Glucose Monitoring Suppl (ACCU-CHEK AVIVA PLUS) W/DEVICE KIT 1 each by Does not apply route 4 (four) times daily - after meals and at bedtime. 12/13/14   Arnoldo Morale, MD  carvedilol (COREG) 25 MG tablet Take 1 tablet (25 mg total) by mouth 2 (two) times daily. 05/07/15   Ripudeep Krystal Eaton, MD  diphenhydrAMINE (BENADRYL) 25 mg capsule Take 1 capsule (25 mg total) by mouth every 6 (six) hours as needed for itching. 05/07/15   Ripudeep Krystal Eaton, MD  ferrous sulfate 325 (65 FE) MG tablet Take 1 tablet (325 mg total) by mouth daily with breakfast. 06/24/14   Debbe Odea, MD  folic acid (FOLVITE) 449 MCG tablet Take 200 mcg by mouth  every morning.     Historical Provider, MD  glucose blood (ACCU-CHEK AVIVA PLUS) test strip Use three times before meals and at bedtime 12/13/14   Arnoldo Morale, MD  insulin aspart (NOVOLOG) 100 UNIT/ML injection Inject 2-10 Units into the skin 3 (three) times daily before meals. SSI: 0-200 = 2 units, 201-250 = 4 units, 251-300 = 6 units, 301-350 = 8 units, 351-400 = 10 units 05/07/15   Ripudeep Krystal Eaton, MD  insulin  glargine (LANTUS) 100 UNIT/ML injection Inject 0.25 mLs (25 Units total) into the skin at bedtime. 05/07/15   Ripudeep Krystal Eaton, MD  lactulose (CHRONULAC) 10 GM/15ML solution Take 15 mLs (10 g total) by mouth 3 (three) times daily. 12/13/14   Arnoldo Morale, MD  Lancet Devices (ACCU-CHEK SOFTCLIX) lancets Use as instructed 12/13/14   Arnoldo Morale, MD  levETIRAcetam (KEPPRA) 500 MG tablet Take 500 mg by mouth every 12 (twelve) hours. 01/25/15   Historical Provider, MD  Multiple Vitamin (MULTIVITAMIN WITH MINERALS) TABS tablet Take 1 tablet by mouth daily.    Historical Provider, MD  mupirocin cream (BACTROBAN) 2 % Apply topically daily. Patient taking differently: Apply 1 application topically daily as needed (itching, skin irritation).  08/27/14   Geradine Girt, DO  oxyCODONE (OXY IR/ROXICODONE) 5 MG immediate release tablet Take 1-2 tablets (5-10 mg total) by mouth every 6 (six) hours as needed for moderate pain or severe pain. 05/07/15   Ripudeep Krystal Eaton, MD  pantoprazole (PROTONIX) 40 MG tablet Take 1 tablet (40 mg total) by mouth 2 (two) times daily. 11/24/14   Thurnell Lose, MD  polyethylene glycol (MIRALAX / GLYCOLAX) packet Take 17 g by mouth 2 (two) times daily. Mix in 8 oz liquid and drink 08/24/14   Historical Provider, MD  promethazine (PHENERGAN) 12.5 MG tablet Take 1 tablet (12.5 mg total) by mouth every 6 (six) hours as needed for nausea or vomiting. 05/07/15   Ripudeep K Rai, MD   BP 183/86 mmHg  Pulse 87  Temp(Src) 97.6 F (36.4 C) (Oral)  Resp 6  SpO2 100% Physical Exam    Constitutional: She appears well-developed and well-nourished. No distress.  HENT:  Head: Normocephalic and atraumatic.  Right Ear: External ear normal.  Left Ear: External ear normal.  Nose: Nose normal.  Mouth/Throat: Oropharynx is clear and moist.  Eyes: Conjunctivae and EOM are normal. Pupils are equal, round, and reactive to light.  Neck: Normal range of motion. Neck supple.  Cardiovascular: Normal rate, regular rhythm, normal heart sounds and intact distal pulses.   Pulmonary/Chest: Effort normal and breath sounds normal.    Hd catheter in place  Abdominal: Soft. There is tenderness.    Musculoskeletal: She exhibits edema and tenderness.       Arms: ttp left forearm, intact radial pulse, well healing sutures, mild warmth  Nursing note and vitals reviewed.   ED Course  Procedures (including critical care time) Labs Review Labs Reviewed  CBC WITH DIFFERENTIAL/PLATELET - Abnormal; Notable for the following:    WBC 3.5 (*)    RBC 3.74 (*)    Hemoglobin 9.8 (*)    HCT 31.2 (*)    RDW 15.7 (*)    Lymphs Abs 0.4 (*)    All other components within normal limits  COMPREHENSIVE METABOLIC PANEL - Abnormal; Notable for the following:    Sodium 127 (*)    Chloride 91 (*)    Glucose, Bld 314 (*)    BUN 28 (*)    Creatinine, Ser 6.61 (*)    Calcium 8.1 (*)    Albumin 2.2 (*)    AST 44 (*)    GFR calc non Af Amer 7 (*)    GFR calc Af Amer 8 (*)    All other components within normal limits  LIPASE, BLOOD - Abnormal; Notable for the following:    Lipase 150 (*)    All other components within normal limits    Imaging Review No results found. I  have personally reviewed and evaluated these images and lab results as part of my medical decision-making.   EKG Interpretation None      MDM    1- abdominal pain/ruq pain- labs pending.Today with ruq pain and ongoing nausea and vomiting. WBC and lft normal but lipase elevated at 150.    Patient with previous elevated lft  thought likely secondary to hep c.  Hida scan performed last admission without acute abnormality although US showed gs with wall thickening.  May need endocopy for gs pancreatitis.   2- renal failure- patient states she did not go to dialysis,   3- pain left forearm at site of recent av fistula placed 9/29 by Dr. Scot Dock- no definite infection.  Discussed with Dr. Oneida Alar, does not feel that antibiotics should be started at this time.  Vascular surgery will need to be consulted for evaluation in patient.  4- hyperglycemia- plan iv fluids, normal anion gap. 5- difficult iv access- patient may require picc line.  She has hd catheter tunneled right [place] and av fistula left forearm.  Discussed with Dr. Blaine Hamper and plan admission to telemetry bed.  Pattricia Boss, MD 05/10/15 [phone]

## 2015-05-09 NOTE — ED Notes (Signed)
Pt s/p AV graft left upper arm.  Arm is painful and swollen to fingertips.  Pt wants this graft taken out.  Pt has temporary dialysis site on right upper chest and wants this removed as well.  Pt still with c/o RUQ abd pain, nausea and vomiting.

## 2015-05-10 ENCOUNTER — Encounter (HOSPITAL_COMMUNITY): Payer: Self-pay | Admitting: *Deleted

## 2015-05-10 ENCOUNTER — Inpatient Hospital Stay (HOSPITAL_COMMUNITY): Payer: Medicare Other

## 2015-05-10 DIAGNOSIS — M79632 Pain in left forearm: Secondary | ICD-10-CM | POA: Diagnosis not present

## 2015-05-10 DIAGNOSIS — D631 Anemia in chronic kidney disease: Secondary | ICD-10-CM | POA: Diagnosis present

## 2015-05-10 DIAGNOSIS — N2581 Secondary hyperparathyroidism of renal origin: Secondary | ICD-10-CM | POA: Diagnosis present

## 2015-05-10 DIAGNOSIS — E1122 Type 2 diabetes mellitus with diabetic chronic kidney disease: Secondary | ICD-10-CM

## 2015-05-10 DIAGNOSIS — Z8673 Personal history of transient ischemic attack (TIA), and cerebral infarction without residual deficits: Secondary | ICD-10-CM | POA: Diagnosis not present

## 2015-05-10 DIAGNOSIS — E1140 Type 2 diabetes mellitus with diabetic neuropathy, unspecified: Secondary | ICD-10-CM | POA: Diagnosis present

## 2015-05-10 DIAGNOSIS — F419 Anxiety disorder, unspecified: Secondary | ICD-10-CM | POA: Diagnosis present

## 2015-05-10 DIAGNOSIS — E871 Hypo-osmolality and hyponatremia: Secondary | ICD-10-CM | POA: Diagnosis present

## 2015-05-10 DIAGNOSIS — N189 Chronic kidney disease, unspecified: Secondary | ICD-10-CM

## 2015-05-10 DIAGNOSIS — K5900 Constipation, unspecified: Secondary | ICD-10-CM | POA: Diagnosis present

## 2015-05-10 DIAGNOSIS — Z531 Procedure and treatment not carried out because of patient's decision for reasons of belief and group pressure: Secondary | ICD-10-CM | POA: Diagnosis present

## 2015-05-10 DIAGNOSIS — I5042 Chronic combined systolic (congestive) and diastolic (congestive) heart failure: Secondary | ICD-10-CM | POA: Diagnosis not present

## 2015-05-10 DIAGNOSIS — K210 Gastro-esophageal reflux disease with esophagitis: Secondary | ICD-10-CM | POA: Diagnosis present

## 2015-05-10 DIAGNOSIS — K8050 Calculus of bile duct without cholangitis or cholecystitis without obstruction: Secondary | ICD-10-CM | POA: Diagnosis present

## 2015-05-10 DIAGNOSIS — Z87891 Personal history of nicotine dependence: Secondary | ICD-10-CM | POA: Diagnosis not present

## 2015-05-10 DIAGNOSIS — H409 Unspecified glaucoma: Secondary | ICD-10-CM | POA: Diagnosis present

## 2015-05-10 DIAGNOSIS — D61818 Other pancytopenia: Secondary | ICD-10-CM | POA: Diagnosis present

## 2015-05-10 DIAGNOSIS — T383X5A Adverse effect of insulin and oral hypoglycemic [antidiabetic] drugs, initial encounter: Secondary | ICD-10-CM | POA: Diagnosis present

## 2015-05-10 DIAGNOSIS — E160 Drug-induced hypoglycemia without coma: Secondary | ICD-10-CM | POA: Diagnosis present

## 2015-05-10 DIAGNOSIS — F319 Bipolar disorder, unspecified: Secondary | ICD-10-CM | POA: Diagnosis present

## 2015-05-10 DIAGNOSIS — N186 End stage renal disease: Secondary | ICD-10-CM | POA: Diagnosis present

## 2015-05-10 DIAGNOSIS — Z794 Long term (current) use of insulin: Secondary | ICD-10-CM | POA: Diagnosis not present

## 2015-05-10 DIAGNOSIS — E11649 Type 2 diabetes mellitus with hypoglycemia without coma: Secondary | ICD-10-CM | POA: Diagnosis present

## 2015-05-10 DIAGNOSIS — I132 Hypertensive heart and chronic kidney disease with heart failure and with stage 5 chronic kidney disease, or end stage renal disease: Secondary | ICD-10-CM | POA: Diagnosis present

## 2015-05-10 DIAGNOSIS — G40909 Epilepsy, unspecified, not intractable, without status epilepticus: Secondary | ICD-10-CM | POA: Diagnosis present

## 2015-05-10 DIAGNOSIS — R1011 Right upper quadrant pain: Secondary | ICD-10-CM

## 2015-05-10 DIAGNOSIS — N185 Chronic kidney disease, stage 5: Secondary | ICD-10-CM | POA: Diagnosis not present

## 2015-05-10 DIAGNOSIS — E785 Hyperlipidemia, unspecified: Secondary | ICD-10-CM | POA: Diagnosis present

## 2015-05-10 DIAGNOSIS — K649 Unspecified hemorrhoids: Secondary | ICD-10-CM | POA: Diagnosis present

## 2015-05-10 DIAGNOSIS — F259 Schizoaffective disorder, unspecified: Secondary | ICD-10-CM | POA: Diagnosis present

## 2015-05-10 DIAGNOSIS — Z992 Dependence on renal dialysis: Secondary | ICD-10-CM | POA: Diagnosis not present

## 2015-05-10 DIAGNOSIS — E1165 Type 2 diabetes mellitus with hyperglycemia: Secondary | ICD-10-CM | POA: Diagnosis present

## 2015-05-10 DIAGNOSIS — B182 Chronic viral hepatitis C: Secondary | ICD-10-CM | POA: Diagnosis present

## 2015-05-10 DIAGNOSIS — E1121 Type 2 diabetes mellitus with diabetic nephropathy: Secondary | ICD-10-CM | POA: Diagnosis present

## 2015-05-10 DIAGNOSIS — Z7982 Long term (current) use of aspirin: Secondary | ICD-10-CM | POA: Diagnosis not present

## 2015-05-10 DIAGNOSIS — M79602 Pain in left arm: Secondary | ICD-10-CM | POA: Diagnosis not present

## 2015-05-10 LAB — COMPREHENSIVE METABOLIC PANEL
ALBUMIN: 2.1 g/dL — AB (ref 3.5–5.0)
ALBUMIN: 2.2 g/dL — AB (ref 3.5–5.0)
ALK PHOS: 112 U/L (ref 38–126)
ALT: 51 U/L (ref 14–54)
ALT: 53 U/L (ref 14–54)
ANION GAP: 12 (ref 5–15)
AST: 44 U/L — ABNORMAL HIGH (ref 15–41)
AST: 49 U/L — AB (ref 15–41)
Alkaline Phosphatase: 103 U/L (ref 38–126)
Anion gap: 9 (ref 5–15)
BUN: 28 mg/dL — ABNORMAL HIGH (ref 6–20)
BUN: 32 mg/dL — AB (ref 6–20)
CALCIUM: 8.1 mg/dL — AB (ref 8.9–10.3)
CHLORIDE: 96 mmol/L — AB (ref 101–111)
CHLORIDE: 91 mmol/L — AB (ref 101–111)
CO2: 22 mmol/L (ref 22–32)
CO2: 24 mmol/L (ref 22–32)
Calcium: 8.1 mg/dL — ABNORMAL LOW (ref 8.9–10.3)
Creatinine, Ser: 6.61 mg/dL — ABNORMAL HIGH (ref 0.44–1.00)
Creatinine, Ser: 6.76 mg/dL — ABNORMAL HIGH (ref 0.44–1.00)
GFR calc Af Amer: 8 mL/min — ABNORMAL LOW (ref >60)
GFR calc non Af Amer: 7 mL/min — ABNORMAL LOW (ref >60)
GFR calc non Af Amer: 7 mL/min — ABNORMAL LOW (ref >60)
GFR, EST AFRICAN AMERICAN: 8 mL/min — AB (ref >60)
GLUCOSE: 298 mg/dL — AB (ref 65–99)
GLUCOSE: 314 mg/dL — AB (ref 65–99)
POTASSIUM: 4.3 mmol/L (ref 3.5–5.1)
POTASSIUM: 4 mmol/L (ref 3.5–5.1)
SODIUM: 127 mmol/L — AB (ref 135–145)
SODIUM: 127 mmol/L — AB (ref 135–145)
Total Bilirubin: 0.8 mg/dL (ref 0.3–1.2)
Total Bilirubin: 1 mg/dL (ref 0.3–1.2)
Total Protein: 6.8 g/dL (ref 6.5–8.1)
Total Protein: 7.1 g/dL (ref 6.5–8.1)

## 2015-05-10 LAB — CBC
HEMATOCRIT: 32 % — AB (ref 36.0–46.0)
HEMOGLOBIN: 10.6 g/dL — AB (ref 12.0–15.0)
MCH: 27.2 pg (ref 26.0–34.0)
MCHC: 33.1 g/dL (ref 30.0–36.0)
MCV: 82.1 fL (ref 78.0–100.0)
Platelets: 139 10*3/uL — ABNORMAL LOW (ref 150–400)
RBC: 3.9 MIL/uL (ref 3.87–5.11)
RDW: 15.8 % — ABNORMAL HIGH (ref 11.5–15.5)
WBC: 3.5 10*3/uL — AB (ref 4.0–10.5)

## 2015-05-10 LAB — CBC WITH DIFFERENTIAL/PLATELET
BASOS PCT: 1 %
Basophils Absolute: 0 10*3/uL (ref 0.0–0.1)
EOS ABS: 0.1 10*3/uL (ref 0.0–0.7)
EOS PCT: 2 %
HCT: 31.2 % — ABNORMAL LOW (ref 36.0–46.0)
HEMOGLOBIN: 9.8 g/dL — AB (ref 12.0–15.0)
LYMPHS ABS: 0.4 10*3/uL — AB (ref 0.7–4.0)
Lymphocytes Relative: 12 %
MCH: 26.2 pg (ref 26.0–34.0)
MCHC: 31.4 g/dL (ref 30.0–36.0)
MCV: 83.4 fL (ref 78.0–100.0)
MONOS PCT: 8 %
Monocytes Absolute: 0.3 10*3/uL (ref 0.1–1.0)
NEUTROS PCT: 77 %
Neutro Abs: 2.7 10*3/uL (ref 1.7–7.7)
PLATELETS: 177 10*3/uL (ref 150–400)
RBC: 3.74 MIL/uL — ABNORMAL LOW (ref 3.87–5.11)
RDW: 15.7 % — ABNORMAL HIGH (ref 11.5–15.5)
WBC: 3.5 10*3/uL — AB (ref 4.0–10.5)

## 2015-05-10 LAB — LIPID PANEL
CHOL/HDL RATIO: 3.1 ratio
Cholesterol: 71 mg/dL (ref 0–200)
HDL: 23 mg/dL — ABNORMAL LOW (ref >40)
LDL CALC: 36 mg/dL (ref 0–99)
TRIGLYCERIDES: 62 mg/dL (ref <150)
VLDL: 12 mg/dL (ref 0–40)

## 2015-05-10 LAB — GLUCOSE, CAPILLARY
GLUCOSE-CAPILLARY: 152 mg/dL — AB (ref 65–99)
GLUCOSE-CAPILLARY: 90 mg/dL (ref 65–99)
Glucose-Capillary: 310 mg/dL — ABNORMAL HIGH (ref 65–99)
Glucose-Capillary: 292 mg/dL — ABNORMAL HIGH (ref 65–99)

## 2015-05-10 LAB — RAPID URINE DRUG SCREEN, HOSP PERFORMED
Amphetamines: NOT DETECTED
Barbiturates: NOT DETECTED
Benzodiazepines: NOT DETECTED
Cocaine: POSITIVE — AB
OPIATES: POSITIVE — AB
TETRAHYDROCANNABINOL: NOT DETECTED

## 2015-05-10 LAB — APTT: APTT: 32 s (ref 24–37)

## 2015-05-10 LAB — PROTIME-INR
INR: 1.41 (ref 0.00–1.49)
Prothrombin Time: 17.4 seconds — ABNORMAL HIGH (ref 11.6–15.2)

## 2015-05-10 LAB — LIPASE, BLOOD: Lipase: 150 U/L — ABNORMAL HIGH (ref 22–51)

## 2015-05-10 MED ORDER — OXYCODONE HCL 5 MG PO TABS
5.0000 mg | ORAL_TABLET | Freq: Four times a day (QID) | ORAL | Status: DC | PRN
  Administered 2015-05-11 – 2015-05-12 (×3): 10 mg via ORAL
  Filled 2015-05-10 (×3): qty 2

## 2015-05-10 MED ORDER — ADULT MULTIVITAMIN W/MINERALS CH
1.0000 | ORAL_TABLET | Freq: Every day | ORAL | Status: DC
  Administered 2015-05-10: 1 via ORAL
  Filled 2015-05-10: qty 1

## 2015-05-10 MED ORDER — DIPHENHYDRAMINE HCL 25 MG PO CAPS
25.0000 mg | ORAL_CAPSULE | Freq: Once | ORAL | Status: AC
  Administered 2015-05-10: 25 mg via ORAL
  Filled 2015-05-10: qty 1

## 2015-05-10 MED ORDER — ONDANSETRON HCL 4 MG/2ML IJ SOLN: 4.0000 mg | Freq: Three times a day (TID) | INTRAMUSCULAR | Status: DC | PRN

## 2015-05-10 MED ORDER — SODIUM CHLORIDE 0.9 % IV SOLN
INTRAVENOUS | Status: DC
Start: 2015-05-10 — End: 2015-05-10

## 2015-05-10 MED ORDER — FERROUS SULFATE 325 (65 FE) MG PO TABS
325.0000 mg | ORAL_TABLET | Freq: Every day | ORAL | Status: DC
  Administered 2015-05-10: 325 mg via ORAL
  Filled 2015-05-10: qty 1

## 2015-05-10 MED ORDER — LEVETIRACETAM 500 MG PO TABS
500.0000 mg | ORAL_TABLET | Freq: Two times a day (BID) | ORAL | Status: DC
  Administered 2015-05-10 – 2015-05-12 (×5): 500 mg via ORAL
  Filled 2015-05-10 (×5): qty 1

## 2015-05-10 MED ORDER — LACTULOSE 10 GM/15ML PO SOLN
10.0000 g | Freq: Three times a day (TID) | ORAL | Status: DC
Start: 2015-05-10 — End: 2015-05-12
  Administered 2015-05-10 – 2015-05-12 (×8): 10 g via ORAL
  Filled 2015-05-10 (×8): qty 15

## 2015-05-10 MED ORDER — SODIUM CHLORIDE 0.9 % IJ SOLN
3.0000 mL | Freq: Two times a day (BID) | INTRAMUSCULAR | Status: DC
  Administered 2015-05-10 – 2015-05-11 (×5): 3 mL via INTRAVENOUS

## 2015-05-10 MED ORDER — HYDRALAZINE HCL 20 MG/ML IJ SOLN
10.0000 mg | Freq: Once | INTRAMUSCULAR | Status: AC
  Administered 2015-05-10: 10 mg via INTRAVENOUS
  Filled 2015-05-10: qty 1

## 2015-05-10 MED ORDER — MORPHINE SULFATE (PF) 4 MG/ML IV SOLN
4.0000 mg | Freq: Once | INTRAVENOUS | Status: AC
  Administered 2015-05-10: 4 mg via INTRAMUSCULAR
  Filled 2015-05-10: qty 1

## 2015-05-10 MED ORDER — HEPARIN SODIUM (PORCINE) 5000 UNIT/ML IJ SOLN
5000.0000 [IU] | Freq: Three times a day (TID) | INTRAMUSCULAR | Status: DC
  Administered 2015-05-10 – 2015-05-12 (×7): 5000 [IU] via SUBCUTANEOUS
  Filled 2015-05-10 (×6): qty 1

## 2015-05-10 MED ORDER — AMLODIPINE BESYLATE 10 MG PO TABS
10.0000 mg | ORAL_TABLET | Freq: Every day | ORAL | Status: DC
  Administered 2015-05-10 – 2015-05-11 (×2): 10 mg via ORAL
  Filled 2015-05-10 (×2): qty 1

## 2015-05-10 MED ORDER — DIPHENHYDRAMINE HCL 25 MG PO CAPS: 25.0000 mg | ORAL_CAPSULE | Freq: Four times a day (QID) | ORAL | Status: DC | PRN

## 2015-05-10 MED ORDER — INSULIN GLARGINE 100 UNIT/ML ~~LOC~~ SOLN
25.0000 [IU] | Freq: Every day | SUBCUTANEOUS | Status: DC
  Administered 2015-05-10: 25 [IU] via SUBCUTANEOUS
  Filled 2015-05-10 (×2): qty 0.25

## 2015-05-10 MED ORDER — PANTOPRAZOLE SODIUM 40 MG PO TBEC
40.0000 mg | DELAYED_RELEASE_TABLET | Freq: Two times a day (BID) | ORAL | Status: DC
  Administered 2015-05-10 – 2015-05-12 (×5): 40 mg via ORAL
  Filled 2015-05-10 (×5): qty 1

## 2015-05-10 MED ORDER — FOLIC ACID 400 MCG PO TABS: 200.0000 ug | ORAL_TABLET | Freq: Every morning | ORAL | Status: DC

## 2015-05-10 MED ORDER — POLYETHYLENE GLYCOL 3350 17 G PO PACK
17.0000 g | PACK | Freq: Two times a day (BID) | ORAL | Status: DC
  Administered 2015-05-11 – 2015-05-12 (×2): 17 g via ORAL
  Filled 2015-05-10 (×4): qty 1

## 2015-05-10 MED ORDER — SODIUM CHLORIDE 0.9 % IV BOLUS (SEPSIS): 500.0000 mL | Freq: Once | INTRAVENOUS | Status: DC

## 2015-05-10 MED ORDER — SODIUM CHLORIDE 0.9 % IV SOLN
125.0000 mg | INTRAVENOUS | Status: DC
  Administered 2015-05-12: 125 mg via INTRAVENOUS
  Filled 2015-05-10 (×4): qty 10

## 2015-05-10 MED ORDER — HYDROMORPHONE HCL 1 MG/ML IJ SOLN
1.0000 mg | INTRAMUSCULAR | Status: DC | PRN
  Administered 2015-05-10 – 2015-05-12 (×6): 1 mg via INTRAVENOUS
  Filled 2015-05-10 (×6): qty 1

## 2015-05-10 MED ORDER — OXYCODONE HCL 5 MG PO TABS
5.0000 mg | ORAL_TABLET | Freq: Four times a day (QID) | ORAL | Status: DC | PRN
  Administered 2015-05-10: 10 mg via ORAL
  Filled 2015-05-10: qty 2

## 2015-05-10 MED ORDER — HYDROMORPHONE HCL 1 MG/ML IJ SOLN: 1.0000 mg | INTRAMUSCULAR | Status: DC | PRN

## 2015-05-10 MED ORDER — MORPHINE SULFATE (PF) 2 MG/ML IV SOLN
2.0000 mg | INTRAVENOUS | Status: DC | PRN
  Administered 2015-05-10: 2 mg via INTRAVENOUS
  Filled 2015-05-10: qty 1

## 2015-05-10 MED ORDER — HYDRALAZINE HCL 20 MG/ML IJ SOLN: 5.0000 mg | INTRAMUSCULAR | Status: DC | PRN

## 2015-05-10 MED ORDER — HYDROXYZINE HCL 50 MG/ML IM SOLN: 25.0000 mg | Freq: Four times a day (QID) | INTRAMUSCULAR | Status: DC | PRN

## 2015-05-10 MED ORDER — FOLIC ACID 1 MG PO TABS
0.5000 mg | ORAL_TABLET | Freq: Every day | ORAL | Status: DC
  Administered 2015-05-10 – 2015-05-12 (×3): 0.5 mg via ORAL
  Filled 2015-05-10 (×3): qty 1

## 2015-05-10 MED ORDER — CARVEDILOL 25 MG PO TABS
25.0000 mg | ORAL_TABLET | Freq: Two times a day (BID) | ORAL | Status: DC
  Administered 2015-05-10 – 2015-05-12 (×5): 25 mg via ORAL
  Filled 2015-05-10 (×5): qty 1

## 2015-05-10 MED ORDER — ATORVASTATIN CALCIUM 40 MG PO TABS
40.0000 mg | ORAL_TABLET | Freq: Every day | ORAL | Status: DC
  Administered 2015-05-10 – 2015-05-11 (×2): 40 mg via ORAL
  Filled 2015-05-10 (×2): qty 1

## 2015-05-10 MED ORDER — HYDROMORPHONE HCL 1 MG/ML IJ SOLN
1.0000 mg | Freq: Once | INTRAMUSCULAR | Status: AC
  Administered 2015-05-10: 1 mg via INTRAVENOUS
  Filled 2015-05-10: qty 1

## 2015-05-10 MED ORDER — INSULIN ASPART 100 UNIT/ML ~~LOC~~ SOLN
0.0000 [IU] | Freq: Three times a day (TID) | SUBCUTANEOUS | Status: DC
  Administered 2015-05-10: 5 [IU] via SUBCUTANEOUS
  Administered 2015-05-10: 2 [IU] via SUBCUTANEOUS
  Administered 2015-05-11: 5 [IU] via SUBCUTANEOUS
  Administered 2015-05-12: 9 [IU] via SUBCUTANEOUS
  Administered 2015-05-12: 2 [IU] via SUBCUTANEOUS

## 2015-05-10 MED ORDER — MUPIROCIN CALCIUM 2 % EX CREA
1.0000 "application " | TOPICAL_CREAM | Freq: Every day | CUTANEOUS | Status: DC | PRN
  Filled 2015-05-10: qty 15

## 2015-05-10 NOTE — ED Notes (Addendum)
6E RN updated that pt to [place] and to floor after.

## 2015-05-10 NOTE — Progress Notes (Signed)
Pt arrived to floor with U/S RN. Pt is alert, oriented and able to walk from stretcher to bed with no complications. Vital signs stable.

## 2015-05-10 NOTE — Progress Notes (Addendum)
PROGRESS NOTE    [name] [id number] DOB: [birth year] DOA: 05/09/2015 PCP: [name], MD  HPI/Brief narrative 49 y.o. female with PMH of hypertension, hyperlipidemia, diabetes mellitus, GERD, depression, anxiety, seizure, combined systolic and diastolic congestive heart failure (EF of 40%), schizophrenia, bipolar disorder, anemia, CKD-V (started HD recently), gallstone, who presents with abdominal pain , nausea, vomiting and left arm pain. She underwent tunneled HD catheter placement on 9/28 by IR and had left brachial forearm AV graft placement on 9/29 by Dr. Scot Dock. Reports that she has been having abdominal pain over right upper quadrant. It started in her previous admission (9/26-10/2). She had history of gallstone. She did HIDA on 05/02/15 which was negative. Gen Surgery was consulted however recommended that there was no indication for cholecystectomy or any surgical intervention at that time.In ED, patient was found to have elevated lipase 150, WBCs 3.5, hemoglobin 9.1on 9/29-->9.8, temperature normal, no tachycardia, creatinine 6.61, BUN 28, ALT 112, AST 44, ALT 53, total bilirubin 1.0. Patient is admitted to inpatient for further evaluation and treatment.   Assessment/Plan:   RUQ abdominal pain -  DD : biliary disease, pancreatitis - seems less likely,  Gastritis/esophagitis/PUD ,uremia maybe contributing versus other etiologies -  treated supportively with bowel rest / liquids, IV fluids and pain management -  Abdominal ultrasound: cholelithiasis , ? Cirrhosis and no hydronephrosis. -  Recent HIDA scan negative -  GI input appreciated: suspect biliary colic as the most likely explanation off for chronic symptoms ,  Plan EGD  10/6 and if EGD negative recommend surgery to consider cholecystectomy.   Cholelithiasis -  As above   ESRD -  Nephrology consulted for HD needs. Missed HD  10/ 4 -  Status post tunneled HD catheter placement 9/28 and left brachial forearm AV  graft placement 9/29   Bipolar disorder -  Seems stable. Does not seem to be on any medications at home. No suicidal or homicidal ideations or delusions or hallucinations reported.   Hepatitis C by history and ? Cirrhosis by ultrasound -  Not clear. Checking hepatitis C viral load.  Uncontrolled DM 2 with renal complications -  Continue Lantus and SSI. Monitor and adjust as needed -  Last A1c : 10.3 on 05/05/15 suggesting poor control.  Essential hypertension -  Controlled on amlodipine (increased from 5-10 mg ) and carvedilol.  Pancytopenia -  Unclear etiology. Follow CBCs. Anemia may be due to chronic disease/chronic kidney disease. Continue iron supplements and folate.   Hyponatremia -  Probably related to ESRD. Monitor across HD.  Left forearm pain -  At site of recent AV fistula placement. No acute findings. Symptomatic treatment -  EDP discussed with Dr. Ruta Hinds  Chronic combined systolic and diastolic CHF -  LVEF by 2-D echo 08/26/14 :  40%. Mild volume overload- manage across dialysis  Seizure disorder -  Continue Keppra   GERD -  Continue PPI    DVT prophylaxis: Subcutaneous heparin Code Status:  Full Family Communication:  None at bedside Disposition Plan:  DC home when medically stable   Consultants:   [facility]   Nephrology  Procedures:   none  Antibiotics:   none   Subjective: Continues to complain of abdominal pain and at the same time asks for regular consistency food and seen ambulating comfortably in the room without painful distress.  Objective: Filed Vitals:   05/10/15 0140 05/10/15 0200 05/10/15 0425 05/10/15 1000  BP: 191/87 169/81 110/72 119/67  Pulse: 91 89  90 88  Temp:  97.7 F (36.5 C) 98 F (36.7 C) 98.2 F (36.8 C)  TempSrc:  Oral Oral Oral  Resp: 16 16 17 18   Weight:  79.2 kg (174 lb 9.7 oz)    SpO2: 100% 100% 100% 100%    Intake/Output Summary (Last 24 hours) at 05/10/15 1605 Last data filed at 05/10/15 1300   Gross per 24 hour  Intake    600 ml  Output      0 ml  Net    600 ml   Filed Weights   05/10/15 0200  Weight: 79.2 kg (174 lb 9.7 oz)     Exam:  General exam:  Pleasant young female seen ambulating comfortably in the room Respiratory system: Clear. No increased work of breathing. Cardiovascular system: S1 & S2 heard, RRR. No JVD, murmurs, gallops, clicks or pedal edema. Telemetry: sinus rhythm Gastrointestinal system: Abdomen is nondistended, soft and nontender. Normal bowel sounds heard. Central nervous system: Alert and oriented. No focal neurological deficits. Extremities: Symmetric 5 x 5 power. Left forearm AVG with positive bruit , mild postop edema but good distal peripheral pulses. No signs to suggest infection.   Data Reviewed: Basic Metabolic Panel:  Recent Labs Lab 05/05/15 1646 05/06/15 0612 05/07/15 0850 05/10/15 0011 05/10/15 0318  NA 133* 128* 131* 127* 127*  K 3.9 5.4* 3.7 4.0 4.3  CL 101 100* 99* 91* 96*  CO2 27 21* 25 24 22   GLUCOSE 165* 326* 258* 314* 298*  BUN 15 22* 15 28* 32*  CREATININE 3.85* 5.14* 3.69* 6.61* 6.76*  CALCIUM 7.3* 7.0* 7.3* 8.1* 8.1*  PHOS 2.3* 3.4  --   --   --    Liver Function Tests:  Recent Labs Lab 05/04/15 0424 05/05/15 1646 05/06/15 0612 05/10/15 0011 05/10/15 0318  AST 50*  --   --  44* 49*  ALT 55*  --   --  53 51  ALKPHOS 80  --   --  112 103  BILITOT 0.4  --   --  1.0 0.8  PROT 5.9*  --   --  6.8 7.1  ALBUMIN 2.0* 2.2* 2.0* 2.2* 2.1*    Recent Labs Lab 05/10/15 0011  LIPASE 150*   No results for input(s): AMMONIA in the last 168 hours. CBC:  Recent Labs Lab 05/04/15 0424 05/10/15 0011 05/10/15 0318  WBC 2.3* 3.5* 3.5*  NEUTROABS  --  2.7  --   HGB 9.1* 9.8* 10.6*  HCT 28.5* 31.2* 32.0*  MCV 81.4 83.4 82.1  PLT 154 177 139*   Cardiac Enzymes: No results for input(s): CKTOTAL, CKMB, CKMBINDEX, TROPONINI in the last 168 hours. BNP (last 3 results)  Recent Labs  06/22/14 1532  PROBNP  28725.0*   CBG:  Recent Labs Lab 05/07/15 0728 05/07/15 1144 05/10/15 0243 05/10/15 0744 05/10/15 1129  GLUCAP 230* 229* 310* 292* 152*    Recent Results (from the past 240 hour(s))  MRSA PCR Screening     Status: None   Collection Time: 05/01/15 11:45 PM  Result Value Ref Range Status   MRSA by PCR NEGATIVE NEGATIVE Final    Comment:        The GeneXpert MRSA Assay (FDA approved for NASAL specimens only), is one component of a comprehensive MRSA colonization surveillance program. It is not intended to diagnose MRSA infection nor to guide or monitor treatment for MRSA infections.   Surgical pcr screen     Status: None   Collection Time: 05/04/15  5:43 AM  Result Value Ref Range Status   MRSA, PCR NEGATIVE NEGATIVE Final   Staphylococcus aureus NEGATIVE NEGATIVE Final    Comment:        The Xpert SA Assay (FDA approved for NASAL specimens in patients over 25 years of age), is one component of a comprehensive surveillance program.  Test performance has been validated by [facility] for patients greater than or equal to 31 year old. It is not intended to diagnose infection nor to guide or monitor treatment.          Studies: US Abdomen Complete  05/10/2015   CLINICAL DATA:  Right upper quadrant pain, nausea and vomiting for 2 weeks.  EXAM: ULTRASOUND ABDOMEN COMPLETE  COMPARISON:  None.  FINDINGS: Gallbladder: There is a 10 mm gallstone in the gallbladder lumen. There also is a small volume intraluminal sludge. There is mild gallbladder mural thickening, 4.6 mm. There is no pericholecystic fluid. The patient was not tender over the gallbladder.  Common bile duct: Diameter: 5 mm  Liver: There is generalized irregular coarsening of hepatic parenchymal echotexture without discrete lesion. There are subtle morphologic irregularities at the surface of the liver which raise the question of cirrhosis. There is no intrahepatic bile duct dilatation.  IVC: No abnormality  visualized.  Pancreas: Unremarkable  Spleen: Upper normal in size.  No focal lesions.  Right Kidney: Length: 11.1 cm. Increased echogenicity suggesting medical renal disease. No hydronephrosis.  Left Kidney: Length: 11.3 cm. Increased echogenicity suggesting medical renal disease. No hydronephrosis.  Abdominal aorta: Upper and mid portions are normal. Distal abdominal aorta not visible.  Other findings: No ascites  IMPRESSION: 1. Cholelithiasis. Mild gallbladder mural thickening or without pericholecystic fluid or Murphy's sign. 2. Coarsened hepatic parenchymal echotexture and subtle morphologic irregularities raising the question of cirrhosis. No focal liver lesions. No bile duct dilatation. 3. Increased echogenicity of the renal parenchyma bilaterally, suggesting medical renal disease. No hydronephrosis.   Electronically Signed   By: Andreas Newport M.D.   On: 05/10/2015 02:33        Scheduled Meds: . amLODipine  10 mg Oral QHS  . atorvastatin  40 mg Oral QHS  . carvedilol  25 mg Oral BID  . ferric gluconate (FERRLECIT/NULECIT) IV  125 mg Intravenous Q M,W,F-HD  . ferrous sulfate  325 mg Oral Q breakfast  . folic acid  0.5 mg Oral Daily  . heparin  5,000 Units Subcutaneous 3 times per day  . insulin aspart  0-9 Units Subcutaneous TID WC  . insulin glargine  25 Units Subcutaneous QHS  . lactulose  10 g Oral TID  . levETIRAcetam  500 mg Oral Q12H  . multivitamin with minerals  1 tablet Oral Daily  . pantoprazole  40 mg Oral BID  . polyethylene glycol  17 g Oral BID  . sodium chloride  3 mL Intravenous Q12H   Continuous Infusions:    Principal Problem:   RUQ abdominal pain Active Problems:   Seizure disorder (HCC)   Hep C w/o coma, chronic (HCC)   Bipolar disorder (HCC)   Schizoaffective disorder, unspecified type (HCC)   Chronic combined systolic and diastolic CHF (congestive heart failure) (HCC)   Anemia of chronic kidney failure   Chronic renal failure, stage 5 (HCC)    Pancreatitis   Left arm pain   Diabetes mellitus with stage 5 chronic kidney disease ([place])    Time spent:  37 minutes    HONGALGI,ANAND, MD, FACP, FHM. [facility] Pager [phone]  If 7PM-7AM, please contact night-coverage [email or web address] Password [email or web address] 05/10/2015, 4:05 PM    LOS: 0 days

## 2015-05-10 NOTE — ED Notes (Signed)
Pt sts she feels itchy.  Patient is actively scratching herself.  MD made aware.

## 2015-05-10 NOTE — ED Notes (Signed)
Admitting at bedside 

## 2015-05-10 NOTE — Consult Note (Signed)
[facility] CONSULT Reason for consult: abdominal pain Referring Physician: [facility] hospitalist. PCP: Dr. Webb. Primary G.I.: none  [name] is an 49 y.o. female.  HPI: she has a multitude of chronic problems including type II diabetes, hypertension, hyperlipidemia, anxiety and depression, congestive heart failure. She carries a diagnosis of bipolar disorder. She has had persistent CKD stage V and recently was admitted to the hospital with progressive rising creatinine and dialysis was initiated. Just a week ago she was having severe upper abdominal pain and had ultrasound which showed gallstones that had negative HIDA scan. She was seen by general surgery who did not feel that her pain was due to her gallbladder. She was discharged home is continued to have upper abdominal pain ever since worsened with leading. She describes epigastric and right upper quadrant pain occurring 15 to 20 minutes after eating often radiating to the right flank and associated with nausea. She has heartburn occasionally and that's been worse recently. She actually skipped her dialysis due to feeling so bad and ended up coming to the emergency room. Lipase was elevated at 150. She has also been complaining of increasing constipation and occasional problems with hemorrhoids and bright blood with wiping. The patient carries a history of hepatitis C but is unable to tell me if she's ever been treated for this.  Past Medical History  Diagnosis Date  . Neuropathy (HCC)   . Glaucoma   . Bipolar 1 disorder (HCC)   . Refusal of blood transfusions as patient is Jehovah's Witness   . Shortness of breath   . TIA (transient ischemic attack) 2010  . Neuromuscular disorder (HCC)     DIABETIC NEUROPATHY  . MRSA infection ?2014    "on the back of my head; spread throughout my bloodstream"  . Bipolar disorder, unspecified (HCC) 02/06/2014  . High cholesterol   . 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  Hepatitis C   . Chronic pain syndrome   . Chronic kidney disease (CKD), stage III (moderate)   . Schizoaffective disorder, unspecified condition 02/08/2014  . Seizure (HCC) dx'd 2014    "don't know what kind; last one was ~ 04/2014"  . Chronic combined systolic and diastolic CHF (congestive heart failure) (HCC)     EF 40% by echo and 48% by stress test  . Anemia   . Hypertension   . Stroke (HCC)     hx TIA  . Neuromuscular disorder (HCC)     diabetic neuropathy  . Shortness of breath dyspnea   . Schizophrenia (HCC)   . Seizures (HCC)   . Hepatitis     hep C  . Bipolar disorder (HCC)   . Glaucoma   . Chronic pain   . Diabetes mellitus without complication (HCC)     on Insulin  . Depression   . Anxiety   . Chronic kidney disease     chronic kidney stage 3 Dr Mattingly  . GERD (gastroesophageal reflux disease)   . History of hiatal hernia   . Headache     sinus headaches  . Pancytopenia (HCC)     Past Surgical History  Procedure Laterality Date  . Amputation Left 05/21/2013    Procedure: Left Midfoot AMPUTATION;  Surgeon: Marcus V Duda, MD;  Location: [facility] OR;  Service: Orthopedics;  Laterality: Left;  Left Midfoot Amputation  . Tubal ligation    . Colonoscopy with propofol N/A 06/22/2013    Procedure: COLONOSCOPY WITH PROPOFOL;    Surgeon: Marc E Magod, MD;  Location: [facility];  Service: Endoscopy;  Laterality: N/A;  . Abdominal hysterectomy  2014  . Tonsillectomy  1970's  . Refractive surgery Bilateral 2013  . Tubal ligation  2000  . Abdominal hysterectomy  2010  . Foot amputation Left     due diabetic neuropathy, could not feel ulcer on bottom of foot  . Eye surgery Bilateral 2010    Lasik  . Av fistula placement Left 05/04/2015    Procedure: ARTERIOVENOUS (AV) GRAFT INSERTION;  Surgeon: Christopher S Dickson, MD;  Location: [facility] OR;  Service: Vascular;  Laterality: Left;    Family History  Problem Relation Age of Onset  . Hypertension Mother   . Hypertension  Father     Social History:  reports that she quit smoking about 5 weeks ago. Her smoking use included Cigarettes. She has a 20 pack-year smoking history. She has never used smokeless tobacco. She reports that she uses illicit drugs ("Crack" cocaine and Cocaine). She reports that she does not drink alcohol.  Allergies:  Allergies  Allergen Reactions  . Gabapentin Itching and Swelling  . Gabapentin Swelling  . Lyrica [Pregabalin] Swelling    Facial and leg swelling  . Other     NO BLOOD PRODUCTS. PT IS A JEHOVAH WITNESS  . Penicillins Hives    Pt tolerates cefepime and ceftriaxone  . Saphris [Asenapine] Swelling    Swelling of the face  . Saphris [Asenapine] Swelling    Facial swelling  . Tramadol Swelling    Causes legs to swell  . Tramadol Swelling    Leg swelling  . Penicillins Hives    Has tolerated Cefepime and Ceftriaxone.    Medications; Prior to Admission medications   Medication Sig Start Date End Date Taking? Authorizing Provider  amLODipine (NORVASC) 5 MG tablet Take 1 tablet (5 mg total) by mouth at bedtime. 05/07/15  Yes Ripudeep K Rai, MD  atorvastatin (LIPITOR) 40 MG tablet Take 1 tablet (40 mg total) by mouth at bedtime. 05/07/15  Yes Ripudeep K Rai, MD  Blood Glucose Monitoring Suppl (ACCU-CHEK AVIVA PLUS) W/DEVICE KIT 1 each by Does not apply route 4 (four) times daily - after meals and at bedtime. 12/13/14  Yes Enobong Amao, MD  carvedilol (COREG) 25 MG tablet Take 1 tablet (25 mg total) by mouth 2 (two) times daily. 05/07/15  Yes Ripudeep K Rai, MD  diphenhydrAMINE (BENADRYL) 25 mg capsule Take 1 capsule (25 mg total) by mouth every 6 (six) hours as needed for itching. 05/07/15  Yes Ripudeep K Rai, MD  ferrous sulfate 325 (65 FE) MG tablet Take 1 tablet (325 mg total) by mouth daily with breakfast. 06/24/14  Yes Saima Rizwan, MD  folic acid (FOLVITE) 400 MCG tablet Take 200 mcg by mouth every morning.    Yes Historical Provider, MD  glucose blood (ACCU-CHEK AVIVA  PLUS) test strip Use three times before meals and at bedtime 12/13/14  Yes Enobong Amao, MD  insulin aspart (NOVOLOG) 100 UNIT/ML injection Inject 2-10 Units into the skin 3 (three) times daily before meals. SSI: 0-200 = 2 units, 201-250 = 4 units, 251-300 = 6 units, 301-350 = 8 units, 351-400 = 10 units 05/07/15  Yes Ripudeep K Rai, MD  insulin glargine (LANTUS) 100 UNIT/ML injection Inject 0.25 mLs (25 Units total) into the skin at bedtime. 05/07/15  Yes Ripudeep K Rai, MD  lactulose (CHRONULAC) 10 GM/15ML solution Take 15 mLs (10 g total) by mouth 3 (three) times daily.   12/13/14  Yes Enobong Amao, MD  Lancet Devices (ACCU-CHEK SOFTCLIX) lancets Use as instructed 12/13/14  Yes Enobong Amao, MD  levETIRAcetam (KEPPRA) 500 MG tablet Take 500 mg by mouth every 12 (twelve) hours. 01/25/15  Yes Historical Provider, MD  Multiple Vitamin (MULTIVITAMIN WITH MINERALS) TABS tablet Take 1 tablet by mouth daily.   Yes Historical Provider, MD  mupirocin cream (BACTROBAN) 2 % Apply topically daily. Patient taking differently: Apply 1 application topically daily as needed (itching, skin irritation).  08/27/14  Yes Jessica U Vann, DO  oxyCODONE (OXY IR/ROXICODONE) 5 MG immediate release tablet Take 1-2 tablets (5-10 mg total) by mouth every 6 (six) hours as needed for moderate pain or severe pain. 05/07/15  Yes Ripudeep K Rai, MD  pantoprazole (PROTONIX) 40 MG tablet Take 1 tablet (40 mg total) by mouth 2 (two) times daily. 11/24/14  Yes Prashant K Singh, MD  polyethylene glycol (MIRALAX / GLYCOLAX) packet Take 17 g by mouth 2 (two) times daily. Mix in 8 oz liquid and drink 08/24/14  Yes Historical Provider, MD  promethazine (PHENERGAN) 12.5 MG tablet Take 1 tablet (12.5 mg total) by mouth every 6 (six) hours as needed for nausea or vomiting. 05/07/15  Yes Ripudeep K Rai, MD  aspirin EC 325 MG EC tablet Take 1 tablet (325 mg total) by mouth daily. Patient not taking: Reported on 05/01/2015 08/27/14   Jessica U Vann, DO   .  amLODipine  10 mg Oral QHS  . atorvastatin  40 mg Oral QHS  . carvedilol  25 mg Oral BID  . ferrous sulfate  325 mg Oral Q breakfast  . folic acid  0.5 mg Oral Daily  . heparin  5,000 Units Subcutaneous 3 times per day  . insulin aspart  0-9 Units Subcutaneous TID WC  . insulin glargine  25 Units Subcutaneous QHS  . lactulose  10 g Oral TID  . levETIRAcetam  500 mg Oral Q12H  . multivitamin with minerals  1 tablet Oral Daily  . pantoprazole  40 mg Oral BID  . polyethylene glycol  17 g Oral BID  . sodium chloride  3 mL Intravenous Q12H   PRN Meds diphenhydrAMINE, hydrALAZINE, hydrOXYzine, morphine injection, mupirocin cream, oxyCODONE Results for orders placed or performed during the hospital encounter of 05/09/15 (from the past 48 hour(s))  CBC with Differential/Platelet     Status: Abnormal   Collection Time: 05/10/15 12:11 AM  Result Value Ref Range   WBC 3.5 (L) 4.0 - 10.5 K/uL   RBC 3.74 (L) 3.87 - 5.11 MIL/uL   Hemoglobin 9.8 (L) 12.0 - 15.0 g/dL   HCT 31.2 (L) 36.0 - 46.0 %   MCV 83.4 78.0 - 100.0 fL   MCH 26.2 26.0 - 34.0 pg   MCHC 31.4 30.0 - 36.0 g/dL   RDW 15.7 (H) 11.5 - 15.5 %   Platelets 177 150 - 400 K/uL   Neutrophils Relative % 77 %   Neutro Abs 2.7 1.7 - 7.7 K/uL   Lymphocytes Relative 12 %   Lymphs Abs 0.4 (L) 0.7 - 4.0 K/uL   Monocytes Relative 8 %   Monocytes Absolute 0.3 0.1 - 1.0 K/uL   Eosinophils Relative 2 %   Eosinophils Absolute 0.1 0.0 - 0.7 K/uL   Basophils Relative 1 %   Basophils Absolute 0.0 0.0 - 0.1 K/uL  Comprehensive metabolic panel     Status: Abnormal   Collection Time: 05/10/15 12:11 AM  Result Value Ref Range   Sodium 127 (  L) 135 - 145 mmol/L   Potassium 4.0 3.5 - 5.1 mmol/L   Chloride 91 (L) 101 - 111 mmol/L   CO2 24 22 - 32 mmol/L   Glucose, Bld 314 (H) 65 - 99 mg/dL   BUN 28 (H) 6 - 20 mg/dL   Creatinine, Ser 6.61 (H) 0.44 - 1.00 mg/dL   Calcium 8.1 (L) 8.9 - 10.3 mg/dL   Total Protein 6.8 6.5 - 8.1 g/dL   Albumin 2.2 (L)  3.5 - 5.0 g/dL   AST 44 (H) 15 - 41 U/L   ALT 53 14 - 54 U/L   Alkaline Phosphatase 112 38 - 126 U/L   Total Bilirubin 1.0 0.3 - 1.2 mg/dL   GFR calc non Af Amer 7 (L) >60 mL/min   GFR calc Af Amer 8 (L) >60 mL/min    Comment: (NOTE) The eGFR has been calculated using the CKD EPI equation. This calculation has not been validated in all clinical situations. eGFR's persistently <60 mL/min signify possible Chronic Kidney Disease.    Anion gap 12 5 - 15  Lipase, blood     Status: Abnormal   Collection Time: 05/10/15 12:11 AM  Result Value Ref Range   Lipase 150 (H) 22 - 51 U/L  Glucose, capillary     Status: Abnormal   Collection Time: 05/10/15  2:43 AM  Result Value Ref Range   Glucose-Capillary 310 (H) 65 - 99 mg/dL  Urine rapid drug screen (hosp performed)     Status: Abnormal   Collection Time: 05/10/15  2:50 AM  Result Value Ref Range   Opiates POSITIVE (A) NONE DETECTED   Cocaine POSITIVE (A) NONE DETECTED   Benzodiazepines NONE DETECTED NONE DETECTED   Amphetamines NONE DETECTED NONE DETECTED   Tetrahydrocannabinol NONE DETECTED NONE DETECTED   Barbiturates NONE DETECTED NONE DETECTED    Comment:        DRUG SCREEN FOR MEDICAL PURPOSES ONLY.  IF CONFIRMATION IS NEEDED FOR ANY PURPOSE, NOTIFY LAB WITHIN 5 DAYS.        LOWEST DETECTABLE LIMITS FOR URINE DRUG SCREEN Drug Class       Cutoff (ng/mL) Amphetamine      1000 Barbiturate      200 Benzodiazepine   449 Tricyclics       675 Opiates          300 Cocaine          300 THC              50   Protime-INR     Status: Abnormal   Collection Time: 05/10/15  3:18 AM  Result Value Ref Range   Prothrombin Time 17.4 (H) 11.6 - 15.2 seconds   INR 1.41 0.00 - 1.49  APTT     Status: None   Collection Time: 05/10/15  3:18 AM  Result Value Ref Range   aPTT 32 24 - 37 seconds  Comprehensive metabolic panel     Status: Abnormal   Collection Time: 05/10/15  3:18 AM  Result Value Ref Range   Sodium 127 (L) 135 - 145 mmol/L    Potassium 4.3 3.5 - 5.1 mmol/L   Chloride 96 (L) 101 - 111 mmol/L   CO2 22 22 - 32 mmol/L   Glucose, Bld 298 (H) 65 - 99 mg/dL   BUN 32 (H) 6 - 20 mg/dL   Creatinine, Ser 6.76 (H) 0.44 - 1.00 mg/dL   Calcium 8.1 (L) 8.9 - 10.3 mg/dL   Total Protein  7.1 6.5 - 8.1 g/dL   Albumin 2.1 (L) 3.5 - 5.0 g/dL   AST 49 (H) 15 - 41 U/L   ALT 51 14 - 54 U/L   Alkaline Phosphatase 103 38 - 126 U/L   Total Bilirubin 0.8 0.3 - 1.2 mg/dL   GFR calc non Af Amer 7 (L) >60 mL/min   GFR calc Af Amer 8 (L) >60 mL/min    Comment: (NOTE) The eGFR has been calculated using the CKD EPI equation. This calculation has not been validated in all clinical situations. eGFR's persistently <60 mL/min signify possible Chronic Kidney Disease.    Anion gap 9 5 - 15  CBC     Status: Abnormal   Collection Time: 05/10/15  3:18 AM  Result Value Ref Range   WBC 3.5 (L) 4.0 - 10.5 K/uL   RBC 3.90 3.87 - 5.11 MIL/uL   Hemoglobin 10.6 (L) 12.0 - 15.0 g/dL   HCT 32.0 (L) 36.0 - 46.0 %   MCV 82.1 78.0 - 100.0 fL   MCH 27.2 26.0 - 34.0 pg   MCHC 33.1 30.0 - 36.0 g/dL   RDW 15.8 (H) 11.5 - 15.5 %   Platelets 139 (L) 150 - 400 K/uL    Comment: SPECIMEN CHECKED FOR CLOTS REPEATED TO VERIFY PLATELET COUNT CONFIRMED BY SMEAR   Lipid panel     Status: Abnormal   Collection Time: 05/10/15  3:18 AM  Result Value Ref Range   Cholesterol 71 0 - 200 mg/dL   Triglycerides 62 <150 mg/dL   HDL 23 (L) >40 mg/dL   Total CHOL/HDL Ratio 3.1 RATIO   VLDL 12 0 - 40 mg/dL   LDL Cholesterol 36 0 - 99 mg/dL    Comment:        Total Cholesterol/HDL:CHD Risk Coronary Heart Disease Risk Table                     Men   Women  1/2 Average Risk   3.4   3.3  Average Risk       5.0   4.4  2 X Average Risk   9.6   7.1  3 X Average Risk  23.4   11.0        Use the calculated Patient Ratio above and the CHD Risk Table to determine the patient's CHD Risk.        ATP III CLASSIFICATION (LDL):  <100     mg/dL   Optimal  100-129  mg/dL    Near or Above                    Optimal  130-159  mg/dL   Borderline  160-189  mg/dL   High  >190     mg/dL   Very High   Glucose, capillary     Status: Abnormal   Collection Time: 05/10/15  7:44 AM  Result Value Ref Range   Glucose-Capillary 292 (H) 65 - 99 mg/dL    US Abdomen Complete  05/10/2015   CLINICAL DATA:  Right upper quadrant pain, nausea and vomiting for 2 weeks.  EXAM: ULTRASOUND ABDOMEN COMPLETE  COMPARISON:  None.  FINDINGS: Gallbladder: There is a 10 mm gallstone in the gallbladder lumen. There also is a small volume intraluminal sludge. There is mild gallbladder mural thickening, 4.6 mm. There is no pericholecystic fluid. The patient was not tender over the gallbladder.  Common bile duct: Diameter: 5 mm  Liver: There is generalized  irregular coarsening of hepatic parenchymal echotexture without discrete lesion. There are subtle morphologic irregularities at the surface of the liver which raise the question of cirrhosis. There is no intrahepatic bile duct dilatation.  IVC: No abnormality visualized.  Pancreas: Unremarkable  Spleen: Upper normal in size.  No focal lesions.  Right Kidney: Length: 11.1 cm. Increased echogenicity suggesting medical renal disease. No hydronephrosis.  Left Kidney: Length: 11.3 cm. Increased echogenicity suggesting medical renal disease. No hydronephrosis.  Abdominal aorta: Upper and mid portions are normal. Distal abdominal aorta not visible.  Other findings: No ascites  IMPRESSION: 1. Cholelithiasis. Mild gallbladder mural thickening or without pericholecystic fluid or Murphy's sign. 2. Coarsened hepatic parenchymal echotexture and subtle morphologic irregularities raising the question of cirrhosis. No focal liver lesions. No bile duct dilatation. 3. Increased echogenicity of the renal parenchyma bilaterally, suggesting medical renal disease. No hydronephrosis.   Electronically Signed   By: Andreas Newport M.D.   On: 05/10/2015 02:33                Blood pressure 110/72, pulse 90, temperature 98 F (36.7 C), temperature source Oral, resp. rate 17, weight 79.2 kg (174 lb 9.7 oz), SpO2 100 %.  Physical exam:   General-- talkative African-American female in no acute distress ENT-- sclera nonicteric mucous membranes moist Neck-- no lymphadenopathy Heart-- regular rate and rhythm without murmurs gallops Lungs-- clear Abdomen-- soft and nontender and nondistended with few bowel sounds present. Patient indicates right upper quadrant is area of her discomfort. Rectal --does have hemorrhoids and skin tags. Digital exam reveals no masses very little stool is soft and 2+ hemoccult positive by myself Psych--. Talkative but appears appropriate   Assessment: 1. Right upper quadrant pain. The nature of her symptoms and location does suggest gallbladder disease. She has had ultrasounds confirming gallstones but the HIDA was negative last week. Feel that biliary colic is still the most likely explanation for her chronic symptoms. 2. Cholelithiasis 3. Stool positive for FOB. This could be due to a multitude of things including hemorrhoids etc. In view of right upper quadrant pain that's unexplained I do think we should go ahead and evaluate for ulcer or  some upper G.I. Source. 4. ESR D. Patient starting now on hemodialysis 5. Anxiety/depression/psychiatric disorder 6. Hepatitis C by history. Workup of this unclear. Patient unable to tell me if she has ever been treated.  Plan: 1. We will go ahead and plan EGD later today. Hopefully will be able to do this after her dialysis. If EGD is negative, what surgery to reconsider cholecystectomy. 2. Will check hepatitis C viral load   Hiran Leard JR,Ajamu Maxon L 05/10/2015, 8:02 AM   Pager: [phone] If no answer or after hours call [phone]

## 2015-05-10 NOTE — Evaluation (Signed)
Physical Therapy Evaluation Patient Details Name: [name] MRN: [id number] DOB: June 24, 1966 Today's Date: 05/10/2015   History of Present Illness  [name] is a 49 y.o. female with PMH of hypertension, hyperlipidemia, diabetes mellitus, GERD, depression, anxiety, seizure, combined systolic and diastolic congestive heart failure (EF of 40%), schizophrenia, bipolar disorder, anemia, CKD-V (started HD recently), gallstone, who presents with abdominal pain and left arm pain.  Clinical Impression   Patient evaluated by Physical Therapy with no further acute PT needs identified. All education has been completed and the patient has no further questions.  See below for any follow-up Physical Therapy or equipment needs. PT is signing off. Thank you for this referral.     Follow Up Recommendations Outpatient PT    Equipment Recommendations  None recommended by PT    Recommendations for Other Services       Precautions / Restrictions        Mobility  Bed Mobility                  Transfers Overall transfer level: Independent                  Ambulation/Gait Ambulation/Gait assistance: Modified independent (Device/Increase time) Ambulation Distance (Feet): 300 Feet Assistive device: None (and occasional use of hallway rail) Gait Pattern/deviations: Step-through pattern;Decreased stance time - left   Gait velocity interpretation: at or above normal speed for age/gender General Gait Details: Noted mild gait deviations due to L forefoot amputation; no loss of balance noted; used hallway rails appropriately  Stairs            Wheelchair Mobility    Modified Rankin (Stroke Patients Only)       Balance                                             Pertinent Vitals/Pain Pain Assessment: No/denies pain    Home Living Family/patient expects to be discharged to:: Private residence Living Arrangements: Other relatives (aunt) Available  Help at Discharge: Available PRN/intermittently Type of Home: House Home Access: Stairs to enter Entrance Stairs-Rails: Can reach both Entrance Stairs-Number of Steps: 5 Home Layout: One level Home Equipment: Walker - standard;Cane - single point Additional Comments: son lives with her, he works nights    Prior Function Level of Independence: Independent               Hand Dominance   Dominant Hand: Left    Extremity/Trunk Assessment   Upper Extremity Assessment: Overall WFL for tasks assessed (Noted R elbow deformity pt attributes to fracture about a year ago; painful RUE with fistula)           Lower Extremity Assessment: LLE deficits/detail   LLE Deficits / Details: previous semi-forefoot amputation; hip, knee WFL  Cervical / Trunk Assessment: Normal  Communication   Communication: No difficulties  Cognition Arousal/Alertness: Awake/alert Behavior During Therapy: WFL for tasks assessed/performed Overall Cognitive Status: Within Functional Limits for tasks assessed                      General Comments General comments (skin integrity, edema, etc.): Pt on clear liquid diet but REALLY wanting to eat    Exercises        Assessment/Plan    PT Assessment All further PT needs can be met in the next venue of  care  PT Diagnosis Generalized weakness   PT Problem List Decreased balance;Other (comment) (Gait deviations)  PT Treatment Interventions     PT Goals (Current goals can be found in the Care Plan section) Acute Rehab PT Goals Patient Stated Goal: Did not state except that she wants to eat PT Goal Formulation: All assessment and education complete, DC therapy    Frequency     Barriers to discharge        Co-evaluation               End of Session   Activity Tolerance: Patient tolerated treatment well Patient left: in bed;with call [place]/phone within reach Nurse Communication: Mobility status         Time: 8337-4451 PT Time  Calculation (min) (ACUTE ONLY): 16 min   Charges:   PT Evaluation $Initial PT Evaluation Tier I: 1 Procedure     PT G CodesRoney Marion Hamff 05/10/2015, 5:06 PM  Roney Marion, [place] Pager [phone] Office [phone]

## 2015-05-10 NOTE — Consult Note (Signed)
   [facility] CM Inpatient Consult   05/10/2015  ALLURE IGOE 06/08/1966 [id number]   Patient evaluated for [facility] services for readmissions. Spoke with Ms. Sorell at bedside to discuss [facility] services. She has been active with [facility] in the past. However, it became difficult to maintain contact with her. She states her number is [phone] and that it is working currently. Agreeable to [facility] follow up again. Consents signed. States she lives with her aunt and other family members. Denies having issues with transportation but states she sometimes has issues with affording medications. She endorses her medications are sometimes free since she has Medicaid. She reports she will have HD on Mondays, Wednesdays, and Fridays. States HD will be new to her. Will request for patient to be assigned to [facility] Made inpatient RNCM aware.  [name], MSN-Ed, RN,BSN [facility] Liaison [phone]

## 2015-05-10 NOTE — Consult Note (Signed)
Renal Service Consult Note [place] 05/10/2015 Sol Blazing Requesting Physician:  Dr Algis Liming  Reason for Consult:  ESRD patient with n/v and abd pain HPI: The patient is a 49 y.o. year-old with hx of DM2, neuropathy, glaucoma, bipolar d/o, TIA, HL, hep C, HTN who was admitted from 9/26 - 10/2 here with RUQ pain, n/v.  She had gallstones but negative HIDA and surgeons saw here and did not recommend surgery. She had been refusing HD but now was agreeable and was started on HD with TDC and L forearm aVG placement.  She had some inpatient HD and was dc'd 3 days ago to continue HD in OP setting. She missed HD Monday and was admitted last night with    Chart review: Sept '16 - as above Apr '16 - symptomatic anemia rx with PPI, stopping nsaids, rec'd 1u prbc, po iron started. CKD stage 5. Refused dialysis. Blurred vision with negative CVA w/u (MRI, CT, MRA) Apr '16 - N/V, HONK, CKD, anemia, sdCHF.  Jan '16  - CP low risk stress test, DM2, CKD stage 4, accel HTN, punture wound R foot   ROS  no fever  no CP  no SOB  no chills  no rash  Past Medical History  Past Medical History  Diagnosis Date  . Neuropathy ([place])   . Glaucoma   . Bipolar 1 disorder ([place])   . Refusal of blood transfusions as patient is Jehovah's Witness   . TIA (transient ischemic attack) 2010  . MRSA infection ?2014    "on the back of my head; spread throughout my bloodstream"  . Bipolar disorder, unspecified ([place]) 02/06/2014  . High cholesterol   . 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 Hepatitis C   . Chronic pain syndrome   . ESRD on hemodialysis ([place])   . Schizoaffective disorder, unspecified condition 02/08/2014  . Seizure [facility]) dx'd 2014    "don't know what kind; last one was ~ 04/2014"  . Chronic combined systolic and diastolic CHF (congestive heart failure) (HCC)     EF 40% by echo and 48% by stress test  . Anemia   . Hypertension   . Depression   .  Anxiety   . GERD (gastroesophageal reflux disease)   . History of hiatal hernia   . Headache     sinus headaches   Past Surgical History  Past Surgical History  Procedure Laterality Date  . Amputation Left 05/21/2013    Procedure: Left Midfoot AMPUTATION;  Surgeon: Newt Minion, MD;  Location: [place];  Service: Orthopedics;  Laterality: Left;  Left Midfoot Amputation  . Tubal ligation    . Colonoscopy with propofol N/A 06/22/2013    Procedure: COLONOSCOPY WITH PROPOFOL;  Surgeon: Jeryl Columbia, MD;  Location: [facility] ENDOS[facility];  Service: Endoscopy;  Laterality: N/A;  . Abdominal hysterectomy  2014  . Tonsillectomy  1970's  . Refractive surgery Bilateral 2013  . Tubal ligation  2000  . Abdominal hysterectomy  2010  . Foot amputation Left     due diabetic neuropathy, could not feel ulcer on bottom of foot  . Eye surgery Bilateral 2010    Lasik  . Av fistula placement Left 05/04/2015    Procedure: ARTERIOVENOUS (AV) GRAFT INSERTION;  Surgeon: Angelia Mould, MD;  Location: [facility] OR;  Service: Vascular;  Laterality: Left;   Family History  Family History  Problem Relation Age of Onset  . Hypertension Mother   .  Hypertension Father    Social History  reports that she quit smoking about 5 weeks ago. Her smoking use included Cigarettes. She has a 20 pack-year smoking history. She has never used smokeless tobacco. She reports that she uses illicit drugs ("Crack" cocaine and Cocaine). She reports that she does not drink alcohol. Allergies  Allergies  Allergen Reactions  . Gabapentin Itching and Swelling  . Gabapentin Swelling  . Lyrica [Pregabalin] Swelling    Facial and leg swelling  . Other     NO BLOOD PRODUCTS. PT IS A JEHOVAH WITNESS  . Penicillins Hives    Pt tolerates cefepime and ceftriaxone  . Saphris [Asenapine] Swelling    Swelling of the face  . Saphris [Asenapine] Swelling    Facial swelling  . Tramadol Swelling    Causes legs to swell  . Tramadol Swelling    Leg  swelling  . Penicillins Hives    Has tolerated Cefepime and Ceftriaxone.   Home medications Prior to Admission medications   Medication Sig Start Date End Date Taking? Authorizing Provider  amLODipine (NORVASC) 5 MG tablet Take 1 tablet (5 mg total) by mouth at bedtime. 05/07/15  Yes Ripudeep Krystal Eaton, MD  atorvastatin (LIPITOR) 40 MG tablet Take 1 tablet (40 mg total) by mouth at bedtime. 05/07/15  Yes Ripudeep Krystal Eaton, MD  Blood Glucose Monitoring Suppl (ACCU-CHEK AVIVA PLUS) W/DEVICE KIT 1 each by Does not apply route 4 (four) times daily - after meals and at bedtime. 12/13/14  Yes Arnoldo Morale, MD  carvedilol (COREG) 25 MG tablet Take 1 tablet (25 mg total) by mouth 2 (two) times daily. 05/07/15  Yes Ripudeep Krystal Eaton, MD  diphenhydrAMINE (BENADRYL) 25 mg capsule Take 1 capsule (25 mg total) by mouth every 6 (six) hours as needed for itching. 05/07/15  Yes Ripudeep Krystal Eaton, MD  ferrous sulfate 325 (65 FE) MG tablet Take 1 tablet (325 mg total) by mouth daily with breakfast. 06/24/14  Yes Debbe Odea, MD  folic acid (FOLVITE) 174 MCG tablet Take 200 mcg by mouth every morning.    Yes Historical Provider, MD  glucose blood (ACCU-CHEK AVIVA PLUS) test strip Use three times before meals and at bedtime 12/13/14  Yes Arnoldo Morale, MD  insulin aspart (NOVOLOG) 100 UNIT/ML injection Inject 2-10 Units into the skin 3 (three) times daily before meals. SSI: 0-200 = 2 units, 201-250 = 4 units, 251-300 = 6 units, 301-350 = 8 units, 351-400 = 10 units 05/07/15  Yes Ripudeep K Rai, MD  insulin glargine (LANTUS) 100 UNIT/ML injection Inject 0.25 mLs (25 Units total) into the skin at bedtime. 05/07/15  Yes Ripudeep Krystal Eaton, MD  lactulose (CHRONULAC) 10 GM/15ML solution Take 15 mLs (10 g total) by mouth 3 (three) times daily. 12/13/14  Yes Arnoldo Morale, MD  Lancet Devices [facility]) lancets Use as instructed 12/13/14  Yes Arnoldo Morale, MD  levETIRAcetam (KEPPRA) 500 MG tablet Take 500 mg by mouth every 12 (twelve) hours.  01/25/15  Yes Historical Provider, MD  Multiple Vitamin (MULTIVITAMIN WITH MINERALS) TABS tablet Take 1 tablet by mouth daily.   Yes Historical Provider, MD  mupirocin cream (BACTROBAN) 2 % Apply topically daily. Patient taking differently: Apply 1 application topically daily as needed (itching, skin irritation).  08/27/14  Yes Geradine Girt, DO  oxyCODONE (OXY IR/ROXICODONE) 5 MG immediate release tablet Take 1-2 tablets (5-10 mg total) by mouth every 6 (six) hours as needed for moderate pain or severe pain. 05/07/15  Yes Ripudeep  Krystal Eaton, MD  pantoprazole (PROTONIX) 40 MG tablet Take 1 tablet (40 mg total) by mouth 2 (two) times daily. 11/24/14  Yes Thurnell Lose, MD  polyethylene glycol (MIRALAX / GLYCOLAX) packet Take 17 g by mouth 2 (two) times daily. Mix in 8 oz liquid and drink 08/24/14  Yes Historical Provider, MD  promethazine (PHENERGAN) 12.5 MG tablet Take 1 tablet (12.5 mg total) by mouth every 6 (six) hours as needed for nausea or vomiting. 05/07/15  Yes Ripudeep Krystal Eaton, MD  aspirin EC 325 MG EC tablet Take 1 tablet (325 mg total) by mouth daily. Patient not taking: Reported on 05/01/2015 08/27/14   Geradine Girt, DO   Liver Function Tests  Recent Labs Lab 05/04/15 0424  05/06/15 0612 05/10/15 0011 05/10/15 0318  AST 50*  --   --  44* 49*  ALT 55*  --   --  53 51  ALKPHOS 80  --   --  112 103  BILITOT 0.4  --   --  1.0 0.8  PROT 5.9*  --   --  6.8 7.1  ALBUMIN 2.0*  < > 2.0* 2.2* 2.1*  < > = values in this interval not displayed.  Recent Labs Lab 05/10/15 0011  LIPASE 150*   CBC  Recent Labs Lab 05/04/15 0424 05/10/15 0011 05/10/15 0318  WBC 2.3* 3.5* 3.5*  NEUTROABS  --  2.7  --   HGB 9.1* 9.8* 10.6*  HCT 28.5* 31.2* 32.0*  MCV 81.4 83.4 82.1  PLT 154 177 811*   Basic Metabolic Panel  Recent Labs Lab 05/04/15 0424 05/05/15 1646 05/06/15 0612 05/07/15 0850 05/10/15 0011 05/10/15 0318  NA 131* 133* 128* 131* 127* 127*  K 3.3* 3.9 5.4* 3.7 4.0 4.3  CL  104 101 100* 99* 91* 96*  CO2 18* 27 21* $Remo'25 24 22  'IgfKY$ GLUCOSE 160* 165* 326* 258* 314* 298*  BUN 47* 15 22* 15 28* 32*  CREATININE 7.64* 3.85* 5.14* 3.69* 6.61* 6.76*  CALCIUM 7.3* 7.3* 7.0* 7.3* 8.1* 8.1*  PHOS  --  2.3* 3.4  --   --   --     Filed Vitals:   05/10/15 0140 05/10/15 0200 05/10/15 0425 05/10/15 1000  BP: 191/87 169/81 110/72 119/67  Pulse: 91 89 90 88  Temp:  97.7 F (36.5 C) 98 F (36.7 C) 98.2 F (36.8 C)  TempSrc:  Oral Oral Oral  Resp: $Remo'16 16 17 18  'vxtdw$ Weight:  79.2 kg (174 lb 9.7 oz)    SpO2: 100% 100% 100% 100%   Exam Alert, no distress, calm No rash, cyanosis or gangrene Sclera anicteric, throat clear No jvd Chest clear bilat RRR no MRG Abd soft ntnd no mass or ascites GU deferred MS 1+ LE edema L forearm AVG +bruit, mild post-op edema and bruising, wounds intact R chest HD cath Neuro alert, ox 3  [facility]   4h  77kg  2/2.5 bath Heparin 5000  HD cath (maturing LFA AVG) Aranesp 100 ug to start today Venofer 100 x 9 to start today   Assessment: 1 Abd pain/ n/v - per primary. N/V may be due to uremia in part, she has only had 2 HD sessions since starting last week 2 ESRD recent start to HD last week 3 Vol excess - mild 4 DM2 5 HTN on norvasc 6 Anemia Hb 10.6, give IV Fe 7 MBD check phos. Ca 8.1   Plan- HD today, max UF as tolerated, 4h, use HD cath.  Kelly Splinter MD (pgr) [phone]    [phone] 05/10/2015, 11:04 AM

## 2015-05-10 NOTE — ED Notes (Signed)
IV team at bedside 

## 2015-05-10 NOTE — H&P (Addendum)
[facility] History and Physical  [name] EBX:[id number] DOB: [birth year] DOA: 05/09/2015  Referring physician: ED physician PCP: [name], MD  Specialists:   Chief Complaint: Abdominal pain, left arm pain  HPI: [name] is a 49 y.o. female with PMH of hypertension, hyperlipidemia, diabetes mellitus, GERD, depression, anxiety, seizure, combined systolic and diastolic congestive heart failure (EF of 40%), schizophrenia, bipolar disorder, anemia, CKD-V (started HD recently), gallstone, who presents with abdominal pain and left arm pain.  Patient underwent tunneled HD catheter placement on 9/28 by IR and had left brachial forearm AV graft placement on 9/29 by Dr. Scot Dock. She states that her arm pain over surgical site has been persistent, 10/10 in severity. It has been worsened and now involves all L arm. She has chills, but no fever. She also reports that she has been having abdominal pain over right upper quadrant. It started in her previous admission (9/26-10/2). She had history of gallstone. She did HIDA on 05/02/15 which was negative. Gen Surgery was consulted however recommended that there was no indication for cholecystectomy or any surgical intervention at that time. Her abdominal pain is located in the right upper quadrant, constant, nonradiating, 10 out of 10 in severity. It is associated with nausea and vomiting. She vomited 4 times today without blood in the vomitus. She states that she drinks alcohol occasionally, approximately once a month. She drank 2 beers. She skip HD today due to feeling very sick. Patient does not have diarrhea, chest pain, shortness breath, coughing, rashes, unilateral weakness.   In ED, patient was found to have elevated lipase 150, WBCs 3.5, hemoglobin 9.1on 9/29-->9.8, temperature normal, no tachycardia, creatinine 6.61, BUN 28, ALT 112, AST 44, ALT 53, total bilirubin 1.0. Patient is admitted to inpatient for further evaluation and  treatment.  Where does patient live?   At home    Can patient participate in ADLs?  Some   Review of Systems:   General: no fevers, has chills, no changes in body weight, has poor appetite, has fatigue HEENT: no blurry vision, hearing changes or sore throat Pulm: no dyspnea, coughing, wheezing CV: no chest pain, palpitations Abd: has nausea, vomiting, abdominal pain, no diarrhea, constipation GU: no dysuria, burning on urination, increased urinary frequency, hematuria  Ext: no leg edema. Has left arm pain. Neuro: no unilateral weakness, numbness, or tingling, no vision change or hearing loss Skin: no rash MSK: No muscle spasm, no deformity, no limitation of range of movement in spin Heme: No easy bruising.  Travel history: No recent long distant travel.  Allergy:  Allergies  Allergen Reactions  . Gabapentin Itching and Swelling  . Gabapentin Swelling  . Lyrica [Pregabalin] Swelling    Facial and leg swelling  . Other     NO BLOOD PRODUCTS. PT IS A JEHOVAH WITNESS  . Penicillins Hives    Pt tolerates cefepime and ceftriaxone  . Saphris [Asenapine] Swelling    Swelling of the face  . Saphris [Asenapine] Swelling    Facial swelling  . Tramadol Swelling    Causes legs to swell  . Tramadol Swelling    Leg swelling  . Penicillins Hives    Has tolerated Cefepime and Ceftriaxone.    Past Medical History  Diagnosis Date  . Neuropathy ([place])   . Glaucoma   . Bipolar 1 disorder ([place])   . Refusal of blood transfusions as patient is Jehovah's Witness   . Shortness of breath   . TIA (transient ischemic attack) 2010  .  Neuromuscular disorder ([place])     DIABETIC NEUROPATHY  . MRSA infection ?2014    "on the back of my head; spread throughout my bloodstream"  . Bipolar disorder, unspecified ([place]) 02/06/2014  . High cholesterol   . 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 Hepatitis C   . Chronic pain syndrome   . Chronic kidney disease (CKD), stage III  (moderate)   . Schizoaffective disorder, unspecified condition 02/08/2014  . Seizure [facility]) dx'd 2014    "don't know what kind; last one was ~ 04/2014"  . Chronic combined systolic and diastolic CHF (congestive heart failure) (HCC)     EF 40% by echo and 48% by stress test  . Anemia   . Hypertension   . Stroke ([place])     hx TIA  . Neuromuscular disorder ([place])     diabetic neuropathy  . Shortness of breath dyspnea   . Schizophrenia ([place])   . Seizures ([place])   . Hepatitis     hep C  . Bipolar disorder ([place])   . Glaucoma   . Chronic pain   . Diabetes mellitus without complication (HCC)     on Insulin  . Depression   . Anxiety   . Chronic kidney disease     chronic kidney stage 3 Dr Mercy Moore  . GERD (gastroesophageal reflux disease)   . History of hiatal hernia   . Headache     sinus headaches  . Pancytopenia [facility])     Past Surgical History  Procedure Laterality Date  . Amputation Left 05/21/2013    Procedure: Left Midfoot AMPUTATION;  Surgeon: Newt Minion, MD;  Location: [facility];  Service: Orthopedics;  Laterality: Left;  Left Midfoot Amputation  . Tubal ligation    . Colonoscopy with propofol N/A 06/22/2013    Procedure: COLONOSCOPY WITH PROPOFOL;  Surgeon: Jeryl Columbia, MD;  Location: [facility];  Service: Endoscopy;  Laterality: N/A;  . Abdominal hysterectomy  2014  . Tonsillectomy  1970's  . Refractive surgery Bilateral 2013  . Tubal ligation  2000  . Abdominal hysterectomy  2010  . Foot amputation Left     due diabetic neuropathy, could not feel ulcer on bottom of foot  . Eye surgery Bilateral 2010    Lasik  . Av fistula placement Left 05/04/2015    Procedure: ARTERIOVENOUS (AV) GRAFT INSERTION;  Surgeon: Angelia Mould, MD;  Location: [facility];  Service: Vascular;  Laterality: Left;    Social History:  reports that she quit smoking about 5 weeks ago. Her smoking use included Cigarettes. She has a 20 pack-year smoking history. She has never used smokeless  tobacco. She reports that she uses illicit drugs ("Crack" cocaine and Cocaine). She reports that she does not drink alcohol.  Family History:  Family History  Problem Relation Age of Onset  . Hypertension Mother   . Hypertension Father      Prior to Admission medications   Medication Sig Start Date End Date Taking? Authorizing Provider  amLODipine (NORVASC) 5 MG tablet Take 1 tablet (5 mg total) by mouth at bedtime. 05/07/15   Ripudeep Krystal Eaton, MD  aspirin EC 325 MG EC tablet Take 1 tablet (325 mg total) by mouth daily. Patient not taking: Reported on 05/01/2015 08/27/14   Geradine Girt, DO  atorvastatin (LIPITOR) 40 MG tablet Take 1 tablet (40 mg total) by mouth at bedtime. 05/07/15   Ripudeep Krystal Eaton, MD  Blood Glucose Monitoring Suppl (  ACCU-CHEK AVIVA PLUS) W/DEVICE KIT 1 each by Does not apply route 4 (four) times daily - after meals and at bedtime. 12/13/14   Arnoldo Morale, MD  carvedilol (COREG) 25 MG tablet Take 1 tablet (25 mg total) by mouth 2 (two) times daily. 05/07/15   Ripudeep Krystal Eaton, MD  diphenhydrAMINE (BENADRYL) 25 mg capsule Take 1 capsule (25 mg total) by mouth every 6 (six) hours as needed for itching. 05/07/15   Ripudeep Krystal Eaton, MD  ferrous sulfate 325 (65 FE) MG tablet Take 1 tablet (325 mg total) by mouth daily with breakfast. 06/24/14   Debbe Odea, MD  folic acid (FOLVITE) 767 MCG tablet Take 200 mcg by mouth every morning.     Historical Provider, MD  glucose blood (ACCU-CHEK AVIVA PLUS) test strip Use three times before meals and at bedtime 12/13/14   Arnoldo Morale, MD  insulin aspart (NOVOLOG) 100 UNIT/ML injection Inject 2-10 Units into the skin 3 (three) times daily before meals. SSI: 0-200 = 2 units, 201-250 = 4 units, 251-300 = 6 units, 301-350 = 8 units, 351-400 = 10 units 05/07/15   Ripudeep K Rai, MD  insulin glargine (LANTUS) 100 UNIT/ML injection Inject 0.25 mLs (25 Units total) into the skin at bedtime. 05/07/15   Ripudeep Krystal Eaton, MD  lactulose (CHRONULAC) 10 GM/15ML  solution Take 15 mLs (10 g total) by mouth 3 (three) times daily. 12/13/14   Arnoldo Morale, MD  Lancet Devices (ACCU-CHEK SOFTCLIX) lancets Use as instructed 12/13/14   Arnoldo Morale, MD  levETIRAcetam (KEPPRA) 500 MG tablet Take 500 mg by mouth every 12 (twelve) hours. 01/25/15   Historical Provider, MD  Multiple Vitamin (MULTIVITAMIN WITH MINERALS) TABS tablet Take 1 tablet by mouth daily.    Historical Provider, MD  mupirocin cream (BACTROBAN) 2 % Apply topically daily. Patient taking differently: Apply 1 application topically daily as needed (itching, skin irritation).  08/27/14   Geradine Girt, DO  oxyCODONE (OXY IR/ROXICODONE) 5 MG immediate release tablet Take 1-2 tablets (5-10 mg total) by mouth every 6 (six) hours as needed for moderate pain or severe pain. 05/07/15   Ripudeep Krystal Eaton, MD  pantoprazole (PROTONIX) 40 MG tablet Take 1 tablet (40 mg total) by mouth 2 (two) times daily. 11/24/14   Thurnell Lose, MD  polyethylene glycol (MIRALAX / GLYCOLAX) packet Take 17 g by mouth 2 (two) times daily. Mix in 8 oz liquid and drink 08/24/14   Historical Provider, MD  promethazine (PHENERGAN) 12.5 MG tablet Take 1 tablet (12.5 mg total) by mouth every 6 (six) hours as needed for nausea or vomiting. 05/07/15   Ripudeep Krystal Eaton, MD    Physical Exam: Filed Vitals:   05/09/15 2330 05/10/15 0106 05/10/15 0140 05/10/15 0200  BP: 189/97 192/102 191/87 169/81  Pulse: 97 85 91 89  Temp:    97.7 F (36.5 C)  TempSrc:    Oral  Resp:   16 16  Weight:    79.2 kg (174 lb 9.7 oz)  SpO2: 100% 100% 100% 100%   General: Not in acute distress HEENT:       Eyes: PERRL, EOMI, no scleral icterus.       ENT: No discharge from the ears and nose, no pharynx injection, no tonsillar enlargement.        Neck: No JVD, no bruit, no mass felt. Heme: No neck lymph node enlargement. Cardiac: S1/S2, RRR, No murmurs, No gallops or rubs. Pulm: No rales, wheezing, rhonchi or rubs. Has HD  cath over R upper chest with clean  surroundings. Abd: Soft, nondistended, nontender, no rebound pain, no organomegaly, BS present. Ext: No pitting leg edema bilaterally. 2+DP/PT pulse bilaterally.  Musculoskeletal: No joint deformities, No joint redness or warmth, no limitation of ROM in spin. Skin: No rashes. She is s/p of AVF placement over left arm. There is TTP to left medial elbow and forearm, with intact radial pulse, well healing sutures, mild warmth, no redness.  Neuro: Alert, oriented X3, cranial nerves II-XII grossly intact, muscle strength 5/5 in all extremities,  Psych: Patient is not psychotic, no suicidal or hemocidal ideation.  Labs on Admission:  Basic Metabolic Panel:  Recent Labs Lab 05/04/15 0424 05/05/15 1646 05/06/15 0612 05/07/15 0850 05/10/15 0011  NA 131* 133* 128* 131* 127*  K 3.3* 3.9 5.4* 3.7 4.0  CL 104 101 100* 99* 91*  CO2 18* 27 21* 25 24  GLUCOSE 160* 165* 326* 258* 314*  BUN 47* 15 22* 15 28*  CREATININE 7.64* 3.85* 5.14* 3.69* 6.61*  CALCIUM 7.3* 7.3* 7.0* 7.3* 8.1*  PHOS  --  2.3* 3.4  --   --    Liver Function Tests:  Recent Labs Lab 05/04/15 0424 05/05/15 1646 05/06/15 0612 05/10/15 0011  AST 50*  --   --  44*  ALT 55*  --   --  53  ALKPHOS 80  --   --  112  BILITOT 0.4  --   --  1.0  PROT 5.9*  --   --  6.8  ALBUMIN 2.0* 2.2* 2.0* 2.2*    Recent Labs Lab 05/10/15 0011  LIPASE 150*   No results for input(s): AMMONIA in the last 168 hours. CBC:  Recent Labs Lab 05/03/15 0430 05/04/15 0424 05/10/15 0011  WBC 2.4* 2.3* 3.5*  NEUTROABS 1.5*  --  2.7  HGB 10.0* 9.1* 9.8*  HCT 31.7* 28.5* 31.2*  MCV 82.1 81.4 83.4  PLT 165 154 177   Cardiac Enzymes: No results for input(s): CKTOTAL, CKMB, CKMBINDEX, TROPONINI in the last 168 hours.  BNP (last 3 results)  Recent Labs  09/30/14 1324 10/27/14 2146 11/09/14 0251  BNP 61.6 63.7 242.0*    ProBNP (last 3 results)  Recent Labs  06/22/14 1532  PROBNP 28725.0*    CBG:  Recent Labs Lab  05/06/15 1704 05/06/15 2105 05/07/15 0728 05/07/15 1144 05/10/15 0243  GLUCAP 131* 160* 230* 229* 310*    Radiological Exams on Admission: US Abdomen Complete  05/10/2015   CLINICAL DATA:  Right upper quadrant pain, nausea and vomiting for 2 weeks.  EXAM: ULTRASOUND ABDOMEN COMPLETE  COMPARISON:  None.  FINDINGS: Gallbladder: There is a 10 mm gallstone in the gallbladder lumen. There also is a small volume intraluminal sludge. There is mild gallbladder mural thickening, 4.6 mm. There is no pericholecystic fluid. The patient was not tender over the gallbladder.  Common bile duct: Diameter: 5 mm  Liver: There is generalized irregular coarsening of hepatic parenchymal echotexture without discrete lesion. There are subtle morphologic irregularities at the surface of the liver which raise the question of cirrhosis. There is no intrahepatic bile duct dilatation.  IVC: No abnormality visualized.  Pancreas: Unremarkable  Spleen: Upper normal in size.  No focal lesions.  Right Kidney: Length: 11.1 cm. Increased echogenicity suggesting medical renal disease. No hydronephrosis.  Left Kidney: Length: 11.3 cm. Increased echogenicity suggesting medical renal disease. No hydronephrosis.  Abdominal aorta: Upper and mid portions are normal. Distal abdominal aorta not visible.  Other findings: No  ascites  IMPRESSION: 1. Cholelithiasis. Mild gallbladder mural thickening or without pericholecystic fluid or Murphy's sign. 2. Coarsened hepatic parenchymal echotexture and subtle morphologic irregularities raising the question of cirrhosis. No focal liver lesions. No bile duct dilatation. 3. Increased echogenicity of the renal parenchyma bilaterally, suggesting medical renal disease. No hydronephrosis.   Electronically Signed   By: Andreas Newport M.D.   On: 05/10/2015 02:33    EKG: Not done in ED, will get one.   Assessment/Plan Principal Problem:   RUQ abdominal pain Active Problems:   Seizure disorder (HCC)   Hep  C w/o coma, chronic (HCC)   Bipolar disorder (HCC)   Schizoaffective disorder, unspecified type (HCC)   Chronic combined systolic and diastolic CHF (congestive heart failure) (HCC)   Anemia of chronic kidney failure   Chronic renal failure, stage 5 (HCC)   Pancreatitis   Left arm pain   Diabetes mellitus with stage 5 chronic kidney disease (HCC)  RUQ abdominal pain: Likely due to pancreatitis given the elevated lipase. Etiology is not clear, patient states that she drinks alcohol occasionally. Patient is not septic. Hemodynamically stable.  -will admit to med-surg bed -NPO for pancreatitis -IVF: 500 cc NS and then at 75 cc/hr -IV morphine for pain control, IV hydroxyzine for nausea -abdominal ultrasound to re-evaluate pancreas, gallbladder, and bile ducts  -check lipid profile to rule out hypertriglyceridemia as triggering factor - I called GI in AM, Dr. Buccini's partner will see pt today.    Left arm pain: she has left forearm at site of recent av fistula placed 9/29 by Dr. Scot Dock. Does not seem be infected. No fever or leukocytosis.  EDP discussed with Dr. Oneida Alar, who did not feel that antibiotics should be started at this time. -hold Abx now -pain control -need to consult her vascular surgery in AM  ESRD on CKD stage 5 due to DM nephropathy and HTN:  She is s/p of tunneled HD catheter placement on 9/28 and s/p of left brachial forearm AV graft placement on 9/29. CLIP process was started by nephrology. She skipped dialysis today. Creatinine 6.61, BUN 28, potassium 4.0, bicarbonate 24 on admission -left message to renal for HD tomorrow.  Diabetes mellitus with nephropathy uncontrolled - Continue sliding scale insulin and Lantus 25 units daily. Titrate as per the blood sugar readings.  HTN: Blood pressure is elevated at 192/102 on admission. Likely due to pain. - increased her amlodipine from 5 to 10 mg daily -Continue Coreg -IV hydralazine when necessary  Chronic combined  systolic and diastolic CHF (congestive heart failure): 2-D echo on 08/26/14 showed EF of 40%. Patient does not have leg edema. CHF is compensated. - volume control per HD   Bipolar disorder -stable mental status   Anemia of chronic kidney failure: Hemoglobin stable. -Follow-up CBC  History of seizures  -Continue Keppra  -check keppra level  GERD: -Protonix  HLD: Last LDL was 45 on 11/09/14 -Continue home medications: Zocor  DM-II: Last A1c 10.3 on 05/05/15, poor controled. Patient is taking NovoLog and Lantus at home -Continue home dose Lantus 25 units daily -SSI  DVT ppx: SQ Heparin    Code Status: Full code Family Communication: None at bed side.     Disposition Plan: Admit to inpatient   Date of Service 05/10/2015    Ivor Costa [facility] Pager [phone]  If 7PM-7AM, please contact night-coverage [email or web address] Password [email or web address] 05/10/2015, 3:42 AM

## 2015-05-10 NOTE — ED Notes (Signed)
This RN attempted IV.  Unable to successfully obtain.  Will wait to put in consult until [facility]

## 2015-05-11 ENCOUNTER — Encounter (HOSPITAL_COMMUNITY)
Admission: EM | Disposition: A | Discharge status: Home / Self Care | Payer: Self-pay | Source: Home / Self Care | Attending: Internal Medicine

## 2015-05-11 ENCOUNTER — Encounter (HOSPITAL_COMMUNITY): Payer: Self-pay

## 2015-05-11 DIAGNOSIS — M79602 Pain in left arm: Secondary | ICD-10-CM

## 2015-05-11 DIAGNOSIS — Z992 Dependence on renal dialysis: Secondary | ICD-10-CM

## 2015-05-11 DIAGNOSIS — N186 End stage renal disease: Secondary | ICD-10-CM

## 2015-05-11 DIAGNOSIS — D61818 Other pancytopenia: Secondary | ICD-10-CM

## 2015-05-11 HISTORY — PX: ESOPHAGOGASTRODUODENOSCOPY: SHX5428

## 2015-05-11 LAB — GLUCOSE, CAPILLARY
GLUCOSE-CAPILLARY: 59 mg/dL — AB (ref 65–99)
GLUCOSE-CAPILLARY: 61 mg/dL — AB (ref 65–99)
GLUCOSE-CAPILLARY: 310 mg/dL — AB (ref 65–99)
Glucose-Capillary: 100 mg/dL — ABNORMAL HIGH (ref 65–99)
Glucose-Capillary: 73 mg/dL (ref 65–99)
Glucose-Capillary: 117 mg/dL — ABNORMAL HIGH (ref 65–99)
Glucose-Capillary: 294 mg/dL — ABNORMAL HIGH (ref 65–99)

## 2015-05-11 LAB — COMPREHENSIVE METABOLIC PANEL
ALK PHOS: 78 U/L (ref 38–126)
ALT: 43 U/L (ref 14–54)
ANION GAP: 6 (ref 5–15)
AST: 47 U/L — ABNORMAL HIGH (ref 15–41)
Albumin: 1.8 g/dL — ABNORMAL LOW (ref 3.5–5.0)
BILIRUBIN TOTAL: 0.5 mg/dL (ref 0.3–1.2)
BUN: 6 mg/dL (ref 6–20)
CALCIUM: 7.2 mg/dL — AB (ref 8.9–10.3)
CO2: 31 mmol/L (ref 22–32)
CREATININE: 3.16 mg/dL — AB (ref 0.44–1.00)
Chloride: 99 mmol/L — ABNORMAL LOW (ref 101–111)
GFR, EST AFRICAN AMERICAN: 19 mL/min — AB (ref >60)
GFR, EST NON AFRICAN AMERICAN: 16 mL/min — AB (ref >60)
Glucose, Bld: 60 mg/dL — ABNORMAL LOW (ref 65–99)
Potassium: 3.2 mmol/L — ABNORMAL LOW (ref 3.5–5.1)
SODIUM: 136 mmol/L (ref 135–145)
TOTAL PROTEIN: 5.6 g/dL — AB (ref 6.5–8.1)

## 2015-05-11 LAB — LIPASE, BLOOD: LIPASE: 47 U/L (ref 22–51)

## 2015-05-11 SURGERY — EGD (ESOPHAGOGASTRODUODENOSCOPY)
Anesthesia: Moderate Sedation

## 2015-05-11 MED ORDER — FENTANYL CITRATE (PF) 100 MCG/2ML IJ SOLN
INTRAMUSCULAR | Status: DC | PRN
  Administered 2015-05-11: 25 ug via INTRAVENOUS

## 2015-05-11 MED ORDER — LIDOCAINE HCL (PF) 1 % IJ SOLN: 5.0000 mL | INTRAMUSCULAR | Status: DC | PRN

## 2015-05-11 MED ORDER — RENA-VITE PO TABS
1.0000 | ORAL_TABLET | Freq: Every day | ORAL | Status: DC
  Administered 2015-05-11: 1 via ORAL
  Filled 2015-05-11: qty 1

## 2015-05-11 MED ORDER — DEXTROSE 50 % IV SOLN
INTRAVENOUS | Status: AC
  Administered 2015-05-11: 25 mL
  Filled 2015-05-11: qty 50

## 2015-05-11 MED ORDER — BUTAMBEN-TETRACAINE-BENZOCAINE 2-2-14 % EX AERO
INHALATION_SPRAY | CUTANEOUS | Status: DC | PRN
  Administered 2015-05-11: 2 via TOPICAL

## 2015-05-11 MED ORDER — LIDOCAINE-PRILOCAINE 2.5-2.5 % EX CREA
1.0000 | TOPICAL_CREAM | CUTANEOUS | Status: DC | PRN
Start: 2015-05-11 — End: 2015-05-12

## 2015-05-11 MED ORDER — SODIUM CHLORIDE 0.9 % IV SOLN: 100.0000 mL | INTRAVENOUS | Status: DC | PRN

## 2015-05-11 MED ORDER — INSULIN GLARGINE 100 UNIT/ML ~~LOC~~ SOLN
15.0000 [IU] | Freq: Every day | SUBCUTANEOUS | Status: DC
  Administered 2015-05-11: 15 [IU] via SUBCUTANEOUS
  Filled 2015-05-11 (×2): qty 0.15

## 2015-05-11 MED ORDER — HEPARIN SODIUM (PORCINE) 1000 UNIT/ML DIALYSIS: 2000.0000 [IU] | INTRAMUSCULAR | Status: DC | PRN

## 2015-05-11 MED ORDER — FENTANYL CITRATE (PF) 100 MCG/2ML IJ SOLN
INTRAMUSCULAR | Status: AC
  Filled 2015-05-11: qty 2

## 2015-05-11 MED ORDER — HEPARIN SODIUM (PORCINE) 1000 UNIT/ML DIALYSIS: 1000.0000 [IU] | INTRAMUSCULAR | Status: DC | PRN

## 2015-05-11 MED ORDER — DARBEPOETIN ALFA 100 MCG/0.5ML IJ SOSY
100.0000 ug | PREFILLED_SYRINGE | INTRAMUSCULAR | Status: DC
  Administered 2015-05-12: 100 ug via INTRAVENOUS
  Filled 2015-05-11: qty 0.5

## 2015-05-11 MED ORDER — ALTEPLASE 2 MG IJ SOLR
2.0000 mg | Freq: Once | INTRAMUSCULAR | Status: DC | PRN
  Filled 2015-05-11: qty 2

## 2015-05-11 MED ORDER — PENTAFLUOROPROP-TETRAFLUOROETH EX AERO: 1.0000 "application " | INHALATION_SPRAY | CUTANEOUS | Status: DC | PRN

## 2015-05-11 MED ORDER — DEXTROSE 50 % IV SOLN
INTRAVENOUS | Status: AC
  Filled 2015-05-11: qty 50

## 2015-05-11 MED ORDER — MIDAZOLAM HCL 5 MG/ML IJ SOLN
INTRAMUSCULAR | Status: AC
  Filled 2015-05-11: qty 2

## 2015-05-11 MED ORDER — MIDAZOLAM HCL 10 MG/2ML IJ SOLN
INTRAMUSCULAR | Status: DC | PRN
  Administered 2015-05-11: 2 mg via INTRAVENOUS

## 2015-05-11 MED ORDER — DIPHENHYDRAMINE HCL 50 MG/ML IJ SOLN
INTRAMUSCULAR | Status: AC
  Filled 2015-05-11: qty 1

## 2015-05-11 NOTE — Progress Notes (Signed)
Subjective:   "sleepy" no complaints- just back from endoscopy  Objective Filed Vitals:   05/11/15 0800 05/11/15 0805 05/11/15 0810 05/11/15 0832  BP: 167/62  162/57 126/66  Pulse: 73 73 74 82  Temp:    97.6 F (36.4 C)  TempSrc:    Axillary  Resp: 14 15 16 14   Weight:      SpO2: 95% 94% 95% 94%   Physical Exam General: drowsy, arousable. No acute distress  Heart: RRR  Lungs: CTA, unlabored Abdomen: soft, nontender  Extremities: trace LE edema  Dialysis Access:  R IJ cath and L AVF +b/t  [facility] 4h 77kg 2/2.5 bath Heparin 5000 HD cath (maturing LFA AVG) Aranesp 100 ug to start today Venofer 100 x 9 to start today   Assessment/Plan: 1. abd pain- just returned from upper endoscopy. Negative HIDA scan last week. History of Heme + stools. Hep C workup 2. ESRD - MWF [place]. HD pending tomorrow. Recent start 3. Anemia -  hgb 10.3 yesterday. Cont ESA and Fe 4. Secondary hyperparathyroidism - last PTH 7- no Vit d.  Phos 3.4 5. HTN/volume - Bp variable. norvasc and coreg 6. Nutrition - alb 1.8- clear liquid. vitamin  Shelle Iron, NP [place] [phone] 05/11/2015,9:25 AM  LOS: 1 day   Pt seen, examined and agree w A/P as above.  Kelly Splinter MD pager [phone]    cell [phone] 05/11/2015, 12:20 PM    Additional Objective Labs: Basic Metabolic Panel:  Recent Labs Lab 05/05/15 1646 05/06/15 0612  05/10/15 0011 05/10/15 0318 05/11/15 0613  NA 133* 128*  < > 127* 127* 136  K 3.9 5.4*  < > 4.0 4.3 3.2*  CL 101 100*  < > 91* 96* 99*  CO2 27 21*  < > 24 22 31   GLUCOSE 165* 326*  < > 314* 298* 60*  BUN 15 22*  < > 28* 32* 6  CREATININE 3.85* 5.14*  < > 6.61* 6.76* 3.16*  CALCIUM 7.3* 7.0*  < > 8.1* 8.1* 7.2*  PHOS 2.3* 3.4  --   --   --   --   < > = values in this interval not displayed. Liver Function Tests:  Recent Labs Lab 05/10/15 0011 05/10/15 0318 05/11/15 0613  AST 44* 49* 47*  ALT 53 51 43  ALKPHOS 112 103 78   BILITOT 1.0 0.8 0.5  PROT 6.8 7.1 5.6*  ALBUMIN 2.2* 2.1* 1.8*    Recent Labs Lab 05/10/15 0011 05/11/15 0613  LIPASE 150* 47   CBC:  Recent Labs Lab 05/10/15 0011 05/10/15 0318  WBC 3.5* 3.5*  NEUTROABS 2.7  --   HGB 9.8* 10.6*  HCT 31.2* 32.0*  MCV 83.4 82.1  PLT 177 139*   Blood Culture    Component Value Date/Time   SDES URINE, RANDOM 07/06/2014 2215   SPECREQUEST NONE 07/06/2014 2215   CULT NO GROWTH Performed at [facility]  07/06/2014 2215   REPTSTATUS 07/08/2014 FINAL 07/06/2014 2215    Cardiac Enzymes: No results for input(s): CKTOTAL, CKMB, CKMBINDEX, TROPONINI in the last 168 hours. CBG:  Recent Labs Lab 05/10/15 1129 05/10/15 2134 05/11/15 0637 05/11/15 0701 05/11/15 0832  GLUCAP 152* 90 59* 100* 73   Iron Studies: No results for input(s): IRON, TIBC, TRANSFERRIN, FERRITIN in the last 72 hours. @lablastinr3 @ Studies/Results: US Abdomen Complete  05/10/2015   CLINICAL DATA:  Right upper quadrant pain, nausea and vomiting for 2 weeks.  EXAM: ULTRASOUND ABDOMEN COMPLETE  COMPARISON:  None.  FINDINGS: Gallbladder: There is a 10 mm gallstone in the gallbladder lumen. There also is a small volume intraluminal sludge. There is mild gallbladder mural thickening, 4.6 mm. There is no pericholecystic fluid. The patient was not tender over the gallbladder.  Common bile duct: Diameter: 5 mm  Liver: There is generalized irregular coarsening of hepatic parenchymal echotexture without discrete lesion. There are subtle morphologic irregularities at the surface of the liver which raise the question of cirrhosis. There is no intrahepatic bile duct dilatation.  IVC: No abnormality visualized.  Pancreas: Unremarkable  Spleen: Upper normal in size.  No focal lesions.  Right Kidney: Length: 11.1 cm. Increased echogenicity suggesting medical renal disease. No hydronephrosis.  Left Kidney: Length: 11.3 cm. Increased echogenicity suggesting medical renal disease. No  hydronephrosis.  Abdominal aorta: Upper and mid portions are normal. Distal abdominal aorta not visible.  Other findings: No ascites  IMPRESSION: 1. Cholelithiasis. Mild gallbladder mural thickening or without pericholecystic fluid or Murphy's sign. 2. Coarsened hepatic parenchymal echotexture and subtle morphologic irregularities raising the question of cirrhosis. No focal liver lesions. No bile duct dilatation. 3. Increased echogenicity of the renal parenchyma bilaterally, suggesting medical renal disease. No hydronephrosis.   Electronically Signed   By: Andreas Newport M.D.   On: 05/10/2015 02:33   Medications:   . amLODipine  10 mg Oral QHS  . atorvastatin  40 mg Oral QHS  . carvedilol  25 mg Oral BID  . ferric gluconate (FERRLECIT/NULECIT) IV  125 mg Intravenous Q M,W,F-HD  . ferrous sulfate  325 mg Oral Q breakfast  . folic acid  0.5 mg Oral Daily  . heparin  5,000 Units Subcutaneous 3 times per day  . insulin aspart  0-9 Units Subcutaneous TID WC  . insulin glargine  25 Units Subcutaneous QHS  . lactulose  10 g Oral TID  . levETIRAcetam  500 mg Oral Q12H  . multivitamin with minerals  1 tablet Oral Daily  . pantoprazole  40 mg Oral BID  . polyethylene glycol  17 g Oral BID  . sodium chloride  3 mL Intravenous Q12H

## 2015-05-11 NOTE — Progress Notes (Signed)
PROGRESS NOTE    [name] [id number] DOB: 1965-11-23 DOA: 05/09/2015 PCP: [name], MD  HPI/Brief narrative 49 y.o. female with PMH of hypertension, hyperlipidemia, diabetes mellitus, GERD, depression, anxiety, seizure, combined systolic and diastolic congestive heart failure (EF of 40%), schizophrenia, bipolar disorder, anemia, CKD-V (started HD recently), gallstone, who presents with abdominal pain , nausea, vomiting and left arm pain. She underwent tunneled HD catheter placement on 9/28 by IR and had left brachial forearm AV graft placement on 9/29 by Dr. Scot Dock. Reports that she has been having abdominal pain over right upper quadrant. It started in her previous admission (9/26-10/2). She had history of gallstone. She did HIDA on 05/02/15 which was negative. Gen Surgery was consulted however recommended that there was no indication for cholecystectomy or any surgical intervention at that time.In ED, patient was found to have elevated lipase 150, WBCs 3.5, hemoglobin 9.1on 9/29-->9.8, temperature normal, no tachycardia, creatinine 6.61, BUN 28, ALT 112, AST 44, ALT 53, total bilirubin 1.0. Patient is admitted to inpatient for further evaluation and treatment.   Assessment/Plan:   RUQ abdominal pain -  DD : biliary disease, pancreatitis - seems less likely, Gastritis/esophagitis/PUD, uremia maybe contributing versus other etiologies -  treated supportively with bowel rest / liquids, IV fluids and pain management -  Abdominal ultrasound: cholelithiasis , ? Cirrhosis and no hydronephrosis. -  Recent HIDA scan negative -  GI input appreciated: suspect biliary colic as the most likely explanation off for chronic symptoms  - EGD negative. GI recommends surgical reevaluation-called. Lipase has normalized.   Cholelithiasis -  As above   ESRD -  Nephrology consulted for HD needs. Missed HD  10/ 4 -  Status post tunneled HD catheter placement 9/28 and left brachial forearm AV graft  placement 9/29 (informed Dr. Scot Dock 10/6 that patient is hospitalized in case he wants to look at surgical site)   Bipolar disorder -  Seems stable. Does not seem to be on any medications at home. No suicidal or homicidal ideations or delusions or hallucinations reported.   Hepatitis C by history and ? Cirrhosis by ultrasound -  Not clear. HCV RNA and antibody pending.  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 Monitor and adjust as needed -  Last A1c : 10.3 on 05/05/15 suggesting poor control. - Hypoglycemia secondary to insulin's and poor oral intake from dietary restrictions related to abdominal pain. Reduce Lantus from 25 units to 15 units at bedtime and continue sensitive SSI. Monitor closely.  Essential hypertension -  Controlled on amlodipine (increased from 5-10 mg ) and carvedilol.  Pancytopenia -  Unclear etiology. Follow CBCs. Anemia may be due to chronic disease/chronic kidney disease. Continue iron supplements and folate.   Hyponatremia -  Probably related to ESRD. Monitor across HD.  Left forearm pain -  At site of recent AV fistula placement. No acute findings. Symptomatic treatment -  EDP discussed with Dr. Ruta Hinds  Chronic combined systolic and diastolic CHF -  LVEF by 2-D echo 08/26/14 :  40%. Mild volume overload- manage across dialysis  Seizure disorder -  Continue Keppra   GERD -  Continue PPI    DVT prophylaxis: Subcutaneous heparin Code Status:  Full Family Communication:  None at bedside Disposition Plan:  DC home when medically stable   Consultants:   [facility]   Nephrology  Procedures:  EGD 10/6: ENDOSCOPIC IMPRESSION: 1. Right Upper Quadrant Pain/stool positive for FOB. EGD normal.  Suspect that the abdominal pain is from her gallbladder. RECOMMENDATIONS: suggest that she be reevaluated by surgery. Continue PPI.  Antibiotics:   none   Subjective: Seen after EGD. Complains of headache  and abdominal pain.  Objective: Filed Vitals:   05/11/15 0800 05/11/15 0805 05/11/15 0810 05/11/15 0832  BP: 167/62  162/57 126/66  Pulse: 73 73 74 82  Temp:    97.6 F (36.4 C)  TempSrc:    Axillary  Resp: 14 15 16 14   Weight:      SpO2: 95% 94% 95% 94%    Intake/Output Summary (Last 24 hours) at 05/11/15 1205 Last data filed at 05/11/15 0910  Gross per 24 hour  Intake    360 ml  Output   3000 ml  Net  -2640 ml   Filed Weights   05/10/15 1628 05/10/15 2115 05/10/15 2135  Weight: 80.5 kg (177 lb 7.5 oz) 77.6 kg (171 lb 1.2 oz) 77.6 kg (171 lb 1.2 oz)     Exam:  General exam:  Pleasant young female lying comfortably supine in bed. Respiratory system: Clear. No increased work of breathing. Cardiovascular system: S1 & S2 heard, RRR. No JVD, murmurs, gallops, clicks or pedal edema.  Gastrointestinal system: Abdomen is nondistended, soft and nontender. Normal bowel sounds heard. Central nervous system: Alert and oriented. No focal neurological deficits. Extremities: Symmetric 5 x 5 power. Left forearm AVG with positive bruit , mild postop edema but good distal peripheral pulses. No signs to suggest infection.   Data Reviewed: Basic Metabolic Panel:  Recent Labs Lab 05/05/15 1646 05/06/15 0612 05/07/15 0850 05/10/15 0011 05/10/15 0318 05/11/15 0613  NA 133* 128* 131* 127* 127* 136  K 3.9 5.4* 3.7 4.0 4.3 3.2*  CL 101 100* 99* 91* 96* 99*  CO2 27 21* 25 24 22 31   GLUCOSE 165* 326* 258* 314* 298* 60*  BUN 15 22* 15 28* 32* 6  CREATININE 3.85* 5.14* 3.69* 6.61* 6.76* 3.16*  CALCIUM 7.3* 7.0* 7.3* 8.1* 8.1* 7.2*  PHOS 2.3* 3.4  --   --   --   --    Liver Function Tests:  Recent Labs Lab 05/05/15 1646 05/06/15 0612 05/10/15 0011 05/10/15 0318 05/11/15 0613  AST  --   --  44* 49* 47*  ALT  --   --  53 51 43  ALKPHOS  --   --  112 103 78  BILITOT  --   --  1.0 0.8 0.5  PROT  --   --  6.8 7.1 5.6*  ALBUMIN 2.2* 2.0* 2.2* 2.1* 1.8*    Recent Labs Lab  05/10/15 0011 05/11/15 0613  LIPASE 150* 47   No results for input(s): AMMONIA in the last 168 hours. CBC:  Recent Labs Lab 05/10/15 0011 05/10/15 0318  WBC 3.5* 3.5*  NEUTROABS 2.7  --   HGB 9.8* 10.6*  HCT 31.2* 32.0*  MCV 83.4 82.1  PLT 177 139*   Cardiac Enzymes: No results for input(s): CKTOTAL, CKMB, CKMBINDEX, TROPONINI in the last 168 hours. BNP (last 3 results)  Recent Labs  06/22/14 1532  PROBNP 28725.0*   CBG:  Recent Labs Lab 05/10/15 2134 05/11/15 0637 05/11/15 0701 05/11/15 0832 05/11/15 1143  GLUCAP 90 59* 100* 73 61*    Recent Results (from the past 240 hour(s))  MRSA PCR Screening     Status: None   Collection Time: 05/01/15 11:45 PM  Result Value Ref Range Status   MRSA by PCR NEGATIVE NEGATIVE Final  Comment:        The GeneXpert MRSA Assay (FDA approved for NASAL specimens only), is one component of a comprehensive MRSA colonization surveillance program. It is not intended to diagnose MRSA infection nor to guide or monitor treatment for MRSA infections.   Surgical pcr screen     Status: None   Collection Time: 05/04/15  5:43 AM  Result Value Ref Range Status   MRSA, PCR NEGATIVE NEGATIVE Final   Staphylococcus aureus NEGATIVE NEGATIVE Final    Comment:        The Xpert SA Assay (FDA approved for NASAL specimens in patients over 41 years of age), is one component of a comprehensive surveillance program.  Test performance has been validated by [facility] for patients greater than or equal to 31 year old. It is not intended to diagnose infection nor to guide or monitor treatment.          Studies: US Abdomen Complete  05/10/2015   CLINICAL DATA:  Right upper quadrant pain, nausea and vomiting for 2 weeks.  EXAM: ULTRASOUND ABDOMEN COMPLETE  COMPARISON:  None.  FINDINGS: Gallbladder: There is a 10 mm gallstone in the gallbladder lumen. There also is a small volume intraluminal sludge. There is mild gallbladder mural  thickening, 4.6 mm. There is no pericholecystic fluid. The patient was not tender over the gallbladder.  Common bile duct: Diameter: 5 mm  Liver: There is generalized irregular coarsening of hepatic parenchymal echotexture without discrete lesion. There are subtle morphologic irregularities at the surface of the liver which raise the question of cirrhosis. There is no intrahepatic bile duct dilatation.  IVC: No abnormality visualized.  Pancreas: Unremarkable  Spleen: Upper normal in size.  No focal lesions.  Right Kidney: Length: 11.1 cm. Increased echogenicity suggesting medical renal disease. No hydronephrosis.  Left Kidney: Length: 11.3 cm. Increased echogenicity suggesting medical renal disease. No hydronephrosis.  Abdominal aorta: Upper and mid portions are normal. Distal abdominal aorta not visible.  Other findings: No ascites  IMPRESSION: 1. Cholelithiasis. Mild gallbladder mural thickening or without pericholecystic fluid or Murphy's sign. 2. Coarsened hepatic parenchymal echotexture and subtle morphologic irregularities raising the question of cirrhosis. No focal liver lesions. No bile duct dilatation. 3. Increased echogenicity of the renal parenchyma bilaterally, suggesting medical renal disease. No hydronephrosis.   Electronically Signed   By: Andreas Newport M.D.   On: 05/10/2015 02:33        Scheduled Meds: . amLODipine  10 mg Oral QHS  . atorvastatin  40 mg Oral QHS  . carvedilol  25 mg Oral BID  . [START ON 05/12/2015] darbepoetin (ARANESP) injection - DIALYSIS  100 mcg Intravenous Q Fri-HD  . dextrose      . ferric gluconate (FERRLECIT/NULECIT) IV  125 mg Intravenous Q M,W,F-HD  . ferrous sulfate  325 mg Oral Q breakfast  . folic acid  0.5 mg Oral Daily  . heparin  5,000 Units Subcutaneous 3 times per day  . insulin aspart  0-9 Units Subcutaneous TID WC  . insulin glargine  15 Units Subcutaneous QHS  . lactulose  10 g Oral TID  . levETIRAcetam  500 mg Oral Q12H  . multivitamin   1 tablet Oral QHS  . pantoprazole  40 mg Oral BID  . polyethylene glycol  17 g Oral BID  . sodium chloride  3 mL Intravenous Q12H   Continuous Infusions:    Principal Problem:   RUQ abdominal pain Active Problems:   Seizure disorder ([place])  Hep C w/o coma, chronic (HCC)   Bipolar disorder (HCC)   Schizoaffective disorder, unspecified type ([place])   Chronic combined systolic and diastolic CHF (congestive heart failure) (HCC)   Anemia of chronic kidney failure   Chronic renal failure, stage 5 (HCC)   Pancreatitis   Left arm pain   Diabetes mellitus with stage 5 chronic kidney disease ([place])    Time spent:  63 minutes    Jaysie Benthall, MD, FACP, FHM. [facility] Pager [phone]  If 7PM-7AM, please contact night-coverage [email or web address] Password [email or web address] 05/11/2015, 12:05 PM    LOS: 1 day

## 2015-05-11 NOTE — Interval H&P Note (Signed)
History and Physical Interval Note:  05/11/2015 7:30 AM  [name]  has presented today for surgery, with the diagnosis of RUQ pain  The various methods of treatment have been discussed with the patient and family. After consideration of risks, benefits and other options for treatment, the patient has consented to  Procedure(s): ESOPHAGOGASTRODUODENOSCOPY (EGD) (N/A) as a surgical intervention .  The patient's history has been reviewed, patient examined, no change in status, stable for surgery.  I have reviewed the patient's chart and labs.  Questions were answered to the patient's satisfaction.     Dresden Ament JR,Jaidalyn Schillo L

## 2015-05-11 NOTE — Progress Notes (Signed)
Hypoglycemic Event  CBG: 59  Treatment: D50 IV 25 mL  Symptoms: Hungry  Follow-up CBG: Time:07:01 CBG Result:100  Possible Reasons for Event: Inadequate meal intake  Comments/MD notified: Patient transported for EGD afterwards.     [name], Maryalyce Sanjuan

## 2015-05-11 NOTE — Op Note (Signed)
[facility] [place], [id number]   ENDOSCOPY PROCEDURE REPORT  PATIENT: [name], [name]  MR#: [id number] BIRTHDATE: [birth year] , 48  yrs. old GENDER: female ENDOSCOPIST:Keval Nam Oletta Lamas, MD REFERRED BY: [facility]. PCP: Dr. Justin Mend. Nephrology PROCEDURE DATE:  05/11/2015 PROCEDURE:   EGD ASA CLASS:    class III INDICATIONS: hemodialysis patient with right upper quadrant pain and gallstones on ultrasound. Has he been positive stool on physical exam. MEDICATION: fentanyl 50 g, Versed 4 mg IV TOPICAL ANESTHETIC:   Cetacaine Spray  DESCRIPTION OF PROCEDURE:   After the risks and benefits of the procedure were explained, informed consent was obtained.  The PENTAX GASTOROSCOPE [id number]  endoscope was introduced through the mouth  and advanced to the second portion of the duodenum .  The instrument was slowly withdrawn as the mucosa was fully examined. Estimated blood loss is zero unless otherwise noted in this procedure report. The duodenum including the bulb and 2nd portion were completely normal with no ulceration or inflammation. The pyloric channel antrum and body of the stomach were seen well and were normal. Cardia seen well in the retro flex view and was normal. No varices were seen. The esophagus was normal with no signs of ulceration or esophagitis. The patient tolerated the procedure well.    The scope was then withdrawn from the patient and the procedure completed.  COMPLICATIONS: There were no immediate complications.  ENDOSCOPIC IMPRESSION: 1. Right Upper Quadrant Pain/stool positive for FOB. EGD normal. Suspect that the abdominal pain is from her gallbladder. RECOMMENDATIONS: suggest that she be reevaluated by surgery. Continue PPI.   _______________________________ Lorrin MaisLaurence Spates, MD 05/11/2015 8:03 AM     cc: Dr. Justin Mend, Dr. Jonnie Finner  CPT CODES: ICD CODES:  The ICD and CPT codes recommended by this  software are interpretations from the data that the clinical staff has captured with the software.  The verification of the translation of this report to the ICD and CPT codes and modifiers is the sole responsibility of the health care institution and practicing physician where this report was generated.  [employer] will not be held responsible for the validity of the ICD and CPT codes included on this report.  AMA assumes no liability for data contained or not contained herein. CPT is a Designer, television/film set of the [employer].  PATIENT NAME:  [name], [name] MR#: [id number]

## 2015-05-11 NOTE — Progress Notes (Signed)
Inpatient Diabetes Program Recommendations  AACE/ADA: New Consensus Statement on Inpatient Glycemic Control (2015)  Target Ranges:  Prepandial:   less than 140 mg/dL      Peak postprandial:   less than 180 mg/dL (1-2 hours)      Critically ill patients:  140 - 180 mg/dL  Results for [name], [name] (MRN [id number]) as of 05/11/2015 10:18  Ref. Range 05/10/2015 02:43 05/10/2015 07:44 05/10/2015 11:29 05/10/2015 21:34 05/11/2015 06:37 05/11/2015 07:01 05/11/2015 08:32  Glucose-Capillary Latest Ref Range: 65-99 mg/dL 310 (H) 292 (H) 152 (H) 90 59 (L) 100 (H) 73   Review of Glycemic Control  Current orders for Inpatient glycemic control: Lantus 25 units QHs, Novolog 0-9 units TID with meals  Inpatient Diabetes Program Recommendations: Insulin - Basal: Patient received Lantus 25 units last night and fasting glucose is 59 mg/dl this morning. Please consider decreasing Lantus to 20 units QHS.  Thanks, Barnie Alderman, RN, MSN, CCRN, CDE Diabetes Coordinator  Inpatient Diabetes Program [phone] (Team Pager from [place] to [place]) [phone] ([place] office) [phone] [facility] office) [phone] [facility] office)

## 2015-05-11 NOTE — Significant Event (Addendum)
Hypoglycemic Event  CBG: 61  Treatment: 15 GM carbohydrate snack  Symptoms: None but does c/o hungry.  Follow-up CBG: Time:1222 CBG Result:117  Possible Reasons for Event: Inadequate meal intake  Comments/MD notified: patient has been NPO returned from EGD. Patient has a diet a this time.     Sanjuana Mae

## 2015-05-11 NOTE — Consult Note (Signed)
[facility] Progress Note  Day of Surgery  Subjective: C/o feeling sleepy, says she has 10/10 pain, but falling in and out of sleep.  She needed to be constantly aroused to ask her any questions.  She appears very comfortable.  Not very cooperative, minimally answers my questions.  Says she has nausea, said she vomited 3 days ago.  Urinating, Last BM unknown.    Objective: Vital signs in last 24 hours: Temp:  [97.6 F (36.4 C)-99 F (37.2 C)] 97.6 F (36.4 C) (10/06 0832) Pulse Rate:  [73-99] 82 (10/06 0832) Resp:  [4-20] 14 (10/06 0832) BP: (96-212)/(52-82) 126/66 mmHg (10/06 0832) SpO2:  [94 %-100 %] 94 % (10/06 0832) Weight:  [77.6 kg (171 lb 1.2 oz)-80.5 kg (177 lb 7.5 oz)] 77.6 kg (171 lb 1.2 oz) (10/05 2135)    Intake/Output from previous day: 10/05 0701 - 10/06 0700 In: 720 [P.O.:720] Out: 3000  Intake/Output this shift:    PE: Gen:  Alert, NAD, pleasant Card:  RRR, no M/G/R heard Pulm:  CTA, no W/R/R Abd: Soft, NT to deep palpation, ND, +BS, no HSM Ext:  Edematous, multiple scarred lesions of both extremities (IVDU? Or from picking?)   Lab Results:   Recent Labs  05/10/15 0011 05/10/15 0318  WBC 3.5* 3.5*  HGB 9.8* 10.6*  HCT 31.2* 32.0*  PLT 177 139*   BMET  Recent Labs  05/10/15 0318 05/11/15 0613  NA 127* 136  K 4.3 3.2*  CL 96* 99*  CO2 22 31  GLUCOSE 298* 60*  BUN 32* 6  CREATININE 6.76* 3.16*  CALCIUM 8.1* 7.2*   PT/INR  Recent Labs  05/10/15 0318  LABPROT 17.4*  INR 1.41   CMP     Component Value Date/Time   NA 136 05/11/2015 0613   K 3.2* 05/11/2015 0613   CL 99* 05/11/2015 0613   CO2 31 05/11/2015 0613   GLUCOSE 60* 05/11/2015 0613   BUN 6 05/11/2015 0613   CREATININE 3.16* 05/11/2015 0613   CALCIUM 7.2* 05/11/2015 0613   PROT 5.6* 05/11/2015 0613   ALBUMIN 1.8* 05/11/2015 0613   AST 47* 05/11/2015 0613   ALT 43 05/11/2015 0613   ALKPHOS 78 05/11/2015 0613   BILITOT 0.5 05/11/2015 0613   GFRNONAA 16*  05/11/2015 0613   GFRAA 19* 05/11/2015 0613   Lipase     Component Value Date/Time   LIPASE 47 05/11/2015 0613       Studies/Results: US Abdomen Complete  05/10/2015   CLINICAL DATA:  Right upper quadrant pain, nausea and vomiting for 2 weeks.  EXAM: ULTRASOUND ABDOMEN COMPLETE  COMPARISON:  None.  FINDINGS: Gallbladder: There is a 10 mm gallstone in the gallbladder lumen. There also is a small volume intraluminal sludge. There is mild gallbladder mural thickening, 4.6 mm. There is no pericholecystic fluid. The patient was not tender over the gallbladder.  Common bile duct: Diameter: 5 mm  Liver: There is generalized irregular coarsening of hepatic parenchymal echotexture without discrete lesion. There are subtle morphologic irregularities at the surface of the liver which raise the question of cirrhosis. There is no intrahepatic bile duct dilatation.  IVC: No abnormality visualized.  Pancreas: Unremarkable  Spleen: Upper normal in size.  No focal lesions.  Right Kidney: Length: 11.1 cm. Increased echogenicity suggesting medical renal disease. No hydronephrosis.  Left Kidney: Length: 11.3 cm. Increased echogenicity suggesting medical renal disease. No hydronephrosis.  Abdominal aorta: Upper and mid portions are normal. Distal abdominal aorta not visible.  Other  findings: No ascites  IMPRESSION: 1. Cholelithiasis. Mild gallbladder mural thickening or without pericholecystic fluid or Murphy's sign. 2. Coarsened hepatic parenchymal echotexture and subtle morphologic irregularities raising the question of cirrhosis. No focal liver lesions. No bile duct dilatation. 3. Increased echogenicity of the renal parenchyma bilaterally, suggesting medical renal disease. No hydronephrosis.   Electronically Signed   By: Andreas Newport M.D.   On: 05/10/2015 02:33    Anti-infectives: Anti-infectives    None       Assessment/Plan Chronic abdominal pain -Patient readmitted for abdominal pain and arm pain.   She has already been seen by serveral of our surgeons who did not feel her gallbaldder is the cause of her pain.   US shows gallstones, wall thickening without fluid and negative murphys sign.  This could be explained by her cirrhosis.  Her HIDA is negative.  LFT are completely normal.  She may have mild pancreatitis.  She is a polysubstance abuser.  UGI was negative. -Her clinical exam is completely unimpressive with minimal distraction.  She c/o 10/10 pain, but does not grimace or wince with palpation.  -Will let Dr. Brantley Stage decide if surgical intervention is necessary/needed. Pancreatitis -Conservative management Hep C/Cirrhosis  Polysubstance abuse New ESRD requiring HD s/p AV graft left upper arm Jehovah's witness Bipolar DM/HTN     LOS: 1 day    Nat Christen 05/11/2015, 9:37 AM Pager: [phone]

## 2015-05-11 NOTE — H&P (View-Only) (Signed)
[facility] CONSULT Reason for consult: abdominal pain Referring Physician: [facility] hospitalist. PCP: Dr. Justin Mend. Primary G.I.: none  [name] is an 49 y.o. female.  HPI: she has a multitude of chronic problems including type II diabetes, hypertension, hyperlipidemia, anxiety and depression, congestive heart failure. She carries a diagnosis of bipolar disorder. She has had persistent CKD stage V and recently was admitted to the hospital with progressive rising creatinine and dialysis was initiated. Just a week ago she was having severe upper abdominal pain and had ultrasound which showed gallstones that had negative HIDA scan. She was seen by general surgery who did not feel that her pain was due to her gallbladder. She was discharged home is continued to have upper abdominal pain ever since worsened with leading. She describes epigastric and right upper quadrant pain occurring 15 to 20 minutes after eating often radiating to the right flank and associated with nausea. She has heartburn occasionally and that's been worse recently. She actually skipped her dialysis due to feeling so bad and ended up coming to the emergency room. Lipase was elevated at 150. She has also been complaining of increasing constipation and occasional problems with hemorrhoids and bright blood with wiping. The patient carries a history of hepatitis C but is unable to tell me if she's ever been treated for this.  Past Medical History  Diagnosis Date  . Neuropathy ([place])   . Glaucoma   . Bipolar 1 disorder ([place])   . Refusal of blood transfusions as patient is Jehovah's Witness   . Shortness of breath   . TIA (transient ischemic attack) 2010  . Neuromuscular disorder ([place])     DIABETIC NEUROPATHY  . MRSA infection ?2014    "on the back of my head; spread throughout my bloodstream"  . Bipolar disorder, unspecified ([place]) 02/06/2014  . High cholesterol   . 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  Hepatitis C   . Chronic pain syndrome   . Chronic kidney disease (CKD), stage III (moderate)   . Schizoaffective disorder, unspecified condition 02/08/2014  . Seizure [facility]) dx'd 2014    "don't know what kind; last one was ~ 04/2014"  . Chronic combined systolic and diastolic CHF (congestive heart failure) (HCC)     EF 40% by echo and 48% by stress test  . Anemia   . Hypertension   . Stroke ([place])     hx TIA  . Neuromuscular disorder ([place])     diabetic neuropathy  . Shortness of breath dyspnea   . Schizophrenia ([place])   . Seizures ([place])   . Hepatitis     hep C  . Bipolar disorder ([place])   . Glaucoma   . Chronic pain   . Diabetes mellitus without complication (HCC)     on Insulin  . Depression   . Anxiety   . Chronic kidney disease     chronic kidney stage 3 Dr Mercy Moore  . GERD (gastroesophageal reflux disease)   . History of hiatal hernia   . Headache     sinus headaches  . Pancytopenia [facility])     Past Surgical History  Procedure Laterality Date  . Amputation Left 05/21/2013    Procedure: Left Midfoot AMPUTATION;  Surgeon: Newt Minion, MD;  Location: [facility];  Service: Orthopedics;  Laterality: Left;  Left Midfoot Amputation  . Tubal ligation    . Colonoscopy with propofol N/A 06/22/2013    Procedure: COLONOSCOPY WITH PROPOFOL;  Surgeon: Marc E Magod, MD;  Location: [facility];  Service: Endoscopy;  Laterality: N/A;  . Abdominal hysterectomy  2014  . Tonsillectomy  1970's  . Refractive surgery Bilateral 2013  . Tubal ligation  2000  . Abdominal hysterectomy  2010  . Foot amputation Left     due diabetic neuropathy, could not feel ulcer on bottom of foot  . Eye surgery Bilateral 2010    Lasik  . Av fistula placement Left 05/04/2015    Procedure: ARTERIOVENOUS (AV) GRAFT INSERTION;  Surgeon: Christopher S Dickson, MD;  Location: [facility] OR;  Service: Vascular;  Laterality: Left;    Family History  Problem Relation Age of Onset  . Hypertension Mother   . Hypertension  Father     Social History:  reports that she quit smoking about 5 weeks ago. Her smoking use included Cigarettes. She has a 20 pack-year smoking history. She has never used smokeless tobacco. She reports that she uses illicit drugs ("Crack" cocaine and Cocaine). She reports that she does not drink alcohol.  Allergies:  Allergies  Allergen Reactions  . Gabapentin Itching and Swelling  . Gabapentin Swelling  . Lyrica [Pregabalin] Swelling    Facial and leg swelling  . Other     NO BLOOD PRODUCTS. PT IS A JEHOVAH WITNESS  . Penicillins Hives    Pt tolerates cefepime and ceftriaxone  . Saphris [Asenapine] Swelling    Swelling of the face  . Saphris [Asenapine] Swelling    Facial swelling  . Tramadol Swelling    Causes legs to swell  . Tramadol Swelling    Leg swelling  . Penicillins Hives    Has tolerated Cefepime and Ceftriaxone.    Medications; Prior to Admission medications   Medication Sig Start Date End Date Taking? Authorizing Provider  amLODipine (NORVASC) 5 MG tablet Take 1 tablet (5 mg total) by mouth at bedtime. 05/07/15  Yes Ripudeep K Rai, MD  atorvastatin (LIPITOR) 40 MG tablet Take 1 tablet (40 mg total) by mouth at bedtime. 05/07/15  Yes Ripudeep K Rai, MD  Blood Glucose Monitoring Suppl (ACCU-CHEK AVIVA PLUS) W/DEVICE KIT 1 each by Does not apply route 4 (four) times daily - after meals and at bedtime. 12/13/14  Yes Enobong Amao, MD  carvedilol (COREG) 25 MG tablet Take 1 tablet (25 mg total) by mouth 2 (two) times daily. 05/07/15  Yes Ripudeep K Rai, MD  diphenhydrAMINE (BENADRYL) 25 mg capsule Take 1 capsule (25 mg total) by mouth every 6 (six) hours as needed for itching. 05/07/15  Yes Ripudeep K Rai, MD  ferrous sulfate 325 (65 FE) MG tablet Take 1 tablet (325 mg total) by mouth daily with breakfast. 06/24/14  Yes Saima Rizwan, MD  folic acid (FOLVITE) 400 MCG tablet Take 200 mcg by mouth every morning.    Yes Historical Provider, MD  glucose blood (ACCU-CHEK AVIVA  PLUS) test strip Use three times before meals and at bedtime 12/13/14  Yes Enobong Amao, MD  insulin aspart (NOVOLOG) 100 UNIT/ML injection Inject 2-10 Units into the skin 3 (three) times daily before meals. SSI: 0-200 = 2 units, 201-250 = 4 units, 251-300 = 6 units, 301-350 = 8 units, 351-400 = 10 units 05/07/15  Yes Ripudeep K Rai, MD  insulin glargine (LANTUS) 100 UNIT/ML injection Inject 0.25 mLs (25 Units total) into the skin at bedtime. 05/07/15  Yes Ripudeep K Rai, MD  lactulose (CHRONULAC) 10 GM/15ML solution Take 15 mLs (10 g total) by mouth 3 (three) times daily.   12/13/14  Yes Enobong Amao, MD  Lancet Devices (ACCU-CHEK SOFTCLIX) lancets Use as instructed 12/13/14  Yes Enobong Amao, MD  levETIRAcetam (KEPPRA) 500 MG tablet Take 500 mg by mouth every 12 (twelve) hours. 01/25/15  Yes Historical Provider, MD  Multiple Vitamin (MULTIVITAMIN WITH MINERALS) TABS tablet Take 1 tablet by mouth daily.   Yes Historical Provider, MD  mupirocin cream (BACTROBAN) 2 % Apply topically daily. Patient taking differently: Apply 1 application topically daily as needed (itching, skin irritation).  08/27/14  Yes Jessica U Vann, DO  oxyCODONE (OXY IR/ROXICODONE) 5 MG immediate release tablet Take 1-2 tablets (5-10 mg total) by mouth every 6 (six) hours as needed for moderate pain or severe pain. 05/07/15  Yes Ripudeep K Rai, MD  pantoprazole (PROTONIX) 40 MG tablet Take 1 tablet (40 mg total) by mouth 2 (two) times daily. 11/24/14  Yes Prashant K Singh, MD  polyethylene glycol (MIRALAX / GLYCOLAX) packet Take 17 g by mouth 2 (two) times daily. Mix in 8 oz liquid and drink 08/24/14  Yes Historical Provider, MD  promethazine (PHENERGAN) 12.5 MG tablet Take 1 tablet (12.5 mg total) by mouth every 6 (six) hours as needed for nausea or vomiting. 05/07/15  Yes Ripudeep K Rai, MD  aspirin EC 325 MG EC tablet Take 1 tablet (325 mg total) by mouth daily. Patient not taking: Reported on 05/01/2015 08/27/14   Jessica U Vann, DO   .  amLODipine  10 mg Oral QHS  . atorvastatin  40 mg Oral QHS  . carvedilol  25 mg Oral BID  . ferrous sulfate  325 mg Oral Q breakfast  . folic acid  0.5 mg Oral Daily  . heparin  5,000 Units Subcutaneous 3 times per day  . insulin aspart  0-9 Units Subcutaneous TID WC  . insulin glargine  25 Units Subcutaneous QHS  . lactulose  10 g Oral TID  . levETIRAcetam  500 mg Oral Q12H  . multivitamin with minerals  1 tablet Oral Daily  . pantoprazole  40 mg Oral BID  . polyethylene glycol  17 g Oral BID  . sodium chloride  3 mL Intravenous Q12H   PRN Meds diphenhydrAMINE, hydrALAZINE, hydrOXYzine, morphine injection, mupirocin cream, oxyCODONE Results for orders placed or performed during the hospital encounter of 05/09/15 (from the past 48 hour(s))  CBC with Differential/Platelet     Status: Abnormal   Collection Time: 05/10/15 12:11 AM  Result Value Ref Range   WBC 3.5 (L) 4.0 - 10.5 K/uL   RBC 3.74 (L) 3.87 - 5.11 MIL/uL   Hemoglobin 9.8 (L) 12.0 - 15.0 g/dL   HCT 31.2 (L) 36.0 - 46.0 %   MCV 83.4 78.0 - 100.0 fL   MCH 26.2 26.0 - 34.0 pg   MCHC 31.4 30.0 - 36.0 g/dL   RDW 15.7 (H) 11.5 - 15.5 %   Platelets 177 150 - 400 K/uL   Neutrophils Relative % 77 %   Neutro Abs 2.7 1.7 - 7.7 K/uL   Lymphocytes Relative 12 %   Lymphs Abs 0.4 (L) 0.7 - 4.0 K/uL   Monocytes Relative 8 %   Monocytes Absolute 0.3 0.1 - 1.0 K/uL   Eosinophils Relative 2 %   Eosinophils Absolute 0.1 0.0 - 0.7 K/uL   Basophils Relative 1 %   Basophils Absolute 0.0 0.0 - 0.1 K/uL  Comprehensive metabolic panel     Status: Abnormal   Collection Time: 05/10/15 12:11 AM  Result Value Ref Range   Sodium 127 (  L) 135 - 145 mmol/L   Potassium 4.0 3.5 - 5.1 mmol/L   Chloride 91 (L) 101 - 111 mmol/L   CO2 24 22 - 32 mmol/L   Glucose, Bld 314 (H) 65 - 99 mg/dL   BUN 28 (H) 6 - 20 mg/dL   Creatinine, Ser 6.61 (H) 0.44 - 1.00 mg/dL   Calcium 8.1 (L) 8.9 - 10.3 mg/dL   Total Protein 6.8 6.5 - 8.1 g/dL   Albumin 2.2 (L)  3.5 - 5.0 g/dL   AST 44 (H) 15 - 41 U/L   ALT 53 14 - 54 U/L   Alkaline Phosphatase 112 38 - 126 U/L   Total Bilirubin 1.0 0.3 - 1.2 mg/dL   GFR calc non Af Amer 7 (L) >60 mL/min   GFR calc Af Amer 8 (L) >60 mL/min    Comment: (NOTE) The eGFR has been calculated using the CKD EPI equation. This calculation has not been validated in all clinical situations. eGFR's persistently <60 mL/min signify possible Chronic Kidney Disease.    Anion gap 12 5 - 15  Lipase, blood     Status: Abnormal   Collection Time: 05/10/15 12:11 AM  Result Value Ref Range   Lipase 150 (H) 22 - 51 U/L  Glucose, capillary     Status: Abnormal   Collection Time: 05/10/15  2:43 AM  Result Value Ref Range   Glucose-Capillary 310 (H) 65 - 99 mg/dL  Urine rapid drug screen (hosp performed)     Status: Abnormal   Collection Time: 05/10/15  2:50 AM  Result Value Ref Range   Opiates POSITIVE (A) NONE DETECTED   Cocaine POSITIVE (A) NONE DETECTED   Benzodiazepines NONE DETECTED NONE DETECTED   Amphetamines NONE DETECTED NONE DETECTED   Tetrahydrocannabinol NONE DETECTED NONE DETECTED   Barbiturates NONE DETECTED NONE DETECTED    Comment:        DRUG SCREEN FOR MEDICAL PURPOSES ONLY.  IF CONFIRMATION IS NEEDED FOR ANY PURPOSE, NOTIFY LAB WITHIN 5 DAYS.        LOWEST DETECTABLE LIMITS FOR URINE DRUG SCREEN Drug Class       Cutoff (ng/mL) Amphetamine      1000 Barbiturate      200 Benzodiazepine   449 Tricyclics       675 Opiates          300 Cocaine          300 THC              50   Protime-INR     Status: Abnormal   Collection Time: 05/10/15  3:18 AM  Result Value Ref Range   Prothrombin Time 17.4 (H) 11.6 - 15.2 seconds   INR 1.41 0.00 - 1.49  APTT     Status: None   Collection Time: 05/10/15  3:18 AM  Result Value Ref Range   aPTT 32 24 - 37 seconds  Comprehensive metabolic panel     Status: Abnormal   Collection Time: 05/10/15  3:18 AM  Result Value Ref Range   Sodium 127 (L) 135 - 145 mmol/L    Potassium 4.3 3.5 - 5.1 mmol/L   Chloride 96 (L) 101 - 111 mmol/L   CO2 22 22 - 32 mmol/L   Glucose, Bld 298 (H) 65 - 99 mg/dL   BUN 32 (H) 6 - 20 mg/dL   Creatinine, Ser 6.76 (H) 0.44 - 1.00 mg/dL   Calcium 8.1 (L) 8.9 - 10.3 mg/dL   Total Protein  7.1 6.5 - 8.1 g/dL   Albumin 2.1 (L) 3.5 - 5.0 g/dL   AST 49 (H) 15 - 41 U/L   ALT 51 14 - 54 U/L   Alkaline Phosphatase 103 38 - 126 U/L   Total Bilirubin 0.8 0.3 - 1.2 mg/dL   GFR calc non Af Amer 7 (L) >60 mL/min   GFR calc Af Amer 8 (L) >60 mL/min    Comment: (NOTE) The eGFR has been calculated using the CKD EPI equation. This calculation has not been validated in all clinical situations. eGFR's persistently <60 mL/min signify possible Chronic Kidney Disease.    Anion gap 9 5 - 15  CBC     Status: Abnormal   Collection Time: 05/10/15  3:18 AM  Result Value Ref Range   WBC 3.5 (L) 4.0 - 10.5 K/uL   RBC 3.90 3.87 - 5.11 MIL/uL   Hemoglobin 10.6 (L) 12.0 - 15.0 g/dL   HCT 32.0 (L) 36.0 - 46.0 %   MCV 82.1 78.0 - 100.0 fL   MCH 27.2 26.0 - 34.0 pg   MCHC 33.1 30.0 - 36.0 g/dL   RDW 15.8 (H) 11.5 - 15.5 %   Platelets 139 (L) 150 - 400 K/uL    Comment: SPECIMEN CHECKED FOR CLOTS REPEATED TO VERIFY PLATELET COUNT CONFIRMED BY SMEAR   Lipid panel     Status: Abnormal   Collection Time: 05/10/15  3:18 AM  Result Value Ref Range   Cholesterol 71 0 - 200 mg/dL   Triglycerides 62 <150 mg/dL   HDL 23 (L) >40 mg/dL   Total CHOL/HDL Ratio 3.1 RATIO   VLDL 12 0 - 40 mg/dL   LDL Cholesterol 36 0 - 99 mg/dL    Comment:        Total Cholesterol/HDL:CHD Risk Coronary Heart Disease Risk Table                     Men   Women  1/2 Average Risk   3.4   3.3  Average Risk       5.0   4.4  2 X Average Risk   9.6   7.1  3 X Average Risk  23.4   11.0        Use the calculated Patient Ratio above and the CHD Risk Table to determine the patient's CHD Risk.        ATP III CLASSIFICATION (LDL):  <100     mg/dL   Optimal  100-129  mg/dL    Near or Above                    Optimal  130-159  mg/dL   Borderline  160-189  mg/dL   High  >190     mg/dL   Very High   Glucose, capillary     Status: Abnormal   Collection Time: 05/10/15  7:44 AM  Result Value Ref Range   Glucose-Capillary 292 (H) 65 - 99 mg/dL    US Abdomen Complete  05/10/2015   CLINICAL DATA:  Right upper quadrant pain, nausea and vomiting for 2 weeks.  EXAM: ULTRASOUND ABDOMEN COMPLETE  COMPARISON:  None.  FINDINGS: Gallbladder: There is a 10 mm gallstone in the gallbladder lumen. There also is a small volume intraluminal sludge. There is mild gallbladder mural thickening, 4.6 mm. There is no pericholecystic fluid. The patient was not tender over the gallbladder.  Common bile duct: Diameter: 5 mm  Liver: There is generalized  irregular coarsening of hepatic parenchymal echotexture without discrete lesion. There are subtle morphologic irregularities at the surface of the liver which raise the question of cirrhosis. There is no intrahepatic bile duct dilatation.  IVC: No abnormality visualized.  Pancreas: Unremarkable  Spleen: Upper normal in size.  No focal lesions.  Right Kidney: Length: 11.1 cm. Increased echogenicity suggesting medical renal disease. No hydronephrosis.  Left Kidney: Length: 11.3 cm. Increased echogenicity suggesting medical renal disease. No hydronephrosis.  Abdominal aorta: Upper and mid portions are normal. Distal abdominal aorta not visible.  Other findings: No ascites  IMPRESSION: 1. Cholelithiasis. Mild gallbladder mural thickening or without pericholecystic fluid or Murphy's sign. 2. Coarsened hepatic parenchymal echotexture and subtle morphologic irregularities raising the question of cirrhosis. No focal liver lesions. No bile duct dilatation. 3. Increased echogenicity of the renal parenchyma bilaterally, suggesting medical renal disease. No hydronephrosis.   Electronically Signed   By: Andreas Newport M.D.   On: 05/10/2015 02:33                Blood pressure 110/72, pulse 90, temperature 98 F (36.7 C), temperature source Oral, resp. rate 17, weight 79.2 kg (174 lb 9.7 oz), SpO2 100 %.  Physical exam:   General-- talkative African-American female in no acute distress ENT-- sclera nonicteric mucous membranes moist Neck-- no lymphadenopathy Heart-- regular rate and rhythm without murmurs gallops Lungs-- clear Abdomen-- soft and nontender and nondistended with few bowel sounds present. Patient indicates right upper quadrant is area of her discomfort. Rectal --does have hemorrhoids and skin tags. Digital exam reveals no masses very little stool is soft and 2+ hemoccult positive by myself Psych--. Talkative but appears appropriate   Assessment: 1. Right upper quadrant pain. The nature of her symptoms and location does suggest gallbladder disease. She has had ultrasounds confirming gallstones but the HIDA was negative last week. Feel that biliary colic is still the most likely explanation for her chronic symptoms. 2. Cholelithiasis 3. Stool positive for FOB. This could be due to a multitude of things including hemorrhoids etc. In view of right upper quadrant pain that's unexplained I do think we should go ahead and evaluate for ulcer or  some upper G.I. Source. 4. ESR D. Patient starting now on hemodialysis 5. Anxiety/depression/psychiatric disorder 6. Hepatitis C by history. Workup of this unclear. Patient unable to tell me if she has ever been treated.  Plan: 1. We will go ahead and plan EGD later today. Hopefully will be able to do this after her dialysis. If EGD is negative, what surgery to reconsider cholecystectomy. 2. Will check hepatitis C viral load   Kandi Brusseau JR,Kodi Guerrera L 05/10/2015, 8:02 AM   Pager: [phone] If no answer or after hours call [phone]

## 2015-05-11 NOTE — Progress Notes (Signed)
Patient returned from ENDO at this time. VSS BG of 73. Patient appears lethargic but able to response with voice. No other distress noted. Will continue to monitor.   Ave Filter, RN

## 2015-05-11 NOTE — Patient Outreach (Signed)
[facility]) Care Management  05/11/2015  SHALAE LAMKE 30-May-1966 [id number]   Referral from [name], RN to assign Community RN, assigned Tomasa Rand, RN.  Thanks, Ronnell Freshwater. [place], [place] Assistant Phone: [phone] Fax: [phone]

## 2015-05-11 NOTE — Progress Notes (Signed)
   VASCULAR SURGERY ASSESSMENT & PLAN:  * Notified of patients admission.  * Left forearm AVG working well. Incisions healing nicely.   * Vascular will be available as needed.   SUBJECTIVE: No pain left arm.  PHYSICAL EXAM: Filed Vitals:   05/11/15 0800 05/11/15 0805 05/11/15 0810 05/11/15 0832  BP: 167/62  162/57 126/66  Pulse: 73 73 74 82  Temp:    97.6 F (36.4 C)  TempSrc:    Axillary  Resp: 14 15 16 14   Weight:      SpO2: 95% 94% 95% 94%   Let FA AVG with good bruit and thrill Incisions healing nicely.   LABS: Lab Results  Component Value Date   WBC 3.5* 05/10/2015   HGB 10.6* 05/10/2015   HCT 32.0* 05/10/2015   MCV 82.1 05/10/2015   PLT 139* 05/10/2015   Lab Results  Component Value Date   CREATININE 3.16* 05/11/2015   Lab Results  Component Value Date   INR 1.41 05/10/2015   CBG (last 3)   Recent Labs  05/11/15 0832 05/11/15 1143 05/11/15 1222  GLUCAP 73 61* 117*    Principal Problem:   RUQ abdominal pain Active Problems:   Seizure disorder (HCC)   Hep C w/o coma, chronic (HCC)   Bipolar disorder (HCC)   Schizoaffective disorder, unspecified type (HCC)   Chronic combined systolic and diastolic CHF (congestive heart failure) (HCC)   Anemia of chronic kidney failure   Chronic renal failure, stage 5 (HCC)   Pancreatitis   Left arm pain   Diabetes mellitus with stage 5 chronic kidney disease ([place])   Gae Gallop [id number] 05/11/2015

## 2015-05-12 ENCOUNTER — Inpatient Hospital Stay (HOSPITAL_COMMUNITY): Payer: Medicare Other

## 2015-05-12 ENCOUNTER — Encounter (HOSPITAL_COMMUNITY): Payer: Self-pay | Admitting: Gastroenterology

## 2015-05-12 DIAGNOSIS — M79632 Pain in left forearm: Secondary | ICD-10-CM

## 2015-05-12 DIAGNOSIS — I5042 Chronic combined systolic (congestive) and diastolic (congestive) heart failure: Secondary | ICD-10-CM

## 2015-05-12 DIAGNOSIS — E1121 Type 2 diabetes mellitus with diabetic nephropathy: Secondary | ICD-10-CM

## 2015-05-12 DIAGNOSIS — N185 Chronic kidney disease, stage 5: Secondary | ICD-10-CM

## 2015-05-12 DIAGNOSIS — D631 Anemia in chronic kidney disease: Secondary | ICD-10-CM

## 2015-05-12 LAB — RENAL FUNCTION PANEL
ALBUMIN: 2 g/dL — AB (ref 3.5–5.0)
ANION GAP: 8 (ref 5–15)
BUN: 13 mg/dL (ref 6–20)
CHLORIDE: 97 mmol/L — AB (ref 101–111)
CO2: 27 mmol/L (ref 22–32)
CREATININE: 4.68 mg/dL — AB (ref 0.44–1.00)
Calcium: 7.6 mg/dL — ABNORMAL LOW (ref 8.9–10.3)
GFR, EST AFRICAN AMERICAN: 12 mL/min — AB (ref >60)
GFR, EST NON AFRICAN AMERICAN: 10 mL/min — AB (ref >60)
GLUCOSE: 105 mg/dL — AB (ref 65–99)
Phosphorus: 4.5 mg/dL (ref 2.5–4.6)
Potassium: 4.1 mmol/L (ref 3.5–5.1)
SODIUM: 132 mmol/L — AB (ref 135–145)

## 2015-05-12 LAB — CBC
HCT: 26 % — ABNORMAL LOW (ref 36.0–46.0)
HCT: 34.2 % — ABNORMAL LOW (ref 36.0–46.0)
HEMOGLOBIN: 7.8 g/dL — AB (ref 12.0–15.0)
Hemoglobin: 10.2 g/dL — ABNORMAL LOW (ref 12.0–15.0)
MCH: 26.5 pg (ref 26.0–34.0)
MCH: 26.4 pg (ref 26.0–34.0)
MCHC: 30 g/dL (ref 30.0–36.0)
MCHC: 29.8 g/dL — ABNORMAL LOW (ref 30.0–36.0)
MCV: 88.4 fL (ref 78.0–100.0)
MCV: 88.6 fL (ref 78.0–100.0)
PLATELETS: 116 10*3/uL — AB (ref 150–400)
Platelets: 121 10*3/uL — ABNORMAL LOW (ref 150–400)
RBC: 2.94 MIL/uL — AB (ref 3.87–5.11)
RBC: 3.86 MIL/uL — AB (ref 3.87–5.11)
RDW: 16.9 % — ABNORMAL HIGH (ref 11.5–15.5)
RDW: 16.7 % — AB (ref 11.5–15.5)
WBC: 2.3 10*3/uL — AB (ref 4.0–10.5)
WBC: 2.4 10*3/uL — AB (ref 4.0–10.5)

## 2015-05-12 LAB — GLUCOSE, CAPILLARY
GLUCOSE-CAPILLARY: 371 mg/dL — AB (ref 65–99)
Glucose-Capillary: 168 mg/dL — ABNORMAL HIGH (ref 65–99)

## 2015-05-12 LAB — LEVETIRACETAM LEVEL: Levetiracetam Lvl: 13.8 ug/mL (ref 10.0–40.0)

## 2015-05-12 LAB — HEPATITIS C ANTIBODY: HCV Ab: >11 s/co ratio — ABNORMAL HIGH (ref 0.0–0.9)

## 2015-05-12 MED ORDER — SODIUM CHLORIDE 0.9 % IV SOLN
100.0000 mL | INTRAVENOUS | Status: DC | PRN
Start: 2015-05-12 — End: 2015-05-12

## 2015-05-12 MED ORDER — AMLODIPINE BESYLATE 5 MG PO TABS: 10.0000 mg | ORAL_TABLET | Freq: Every day | ORAL | Status: DC

## 2015-05-12 MED ORDER — PENTAFLUOROPROP-TETRAFLUOROETH EX AERO: 1.0000 "application " | INHALATION_SPRAY | CUTANEOUS | Status: DC | PRN

## 2015-05-12 MED ORDER — SODIUM CHLORIDE 0.9 % IV SOLN: 100.0000 mL | INTRAVENOUS | Status: DC | PRN

## 2015-05-12 MED ORDER — ALTEPLASE 2 MG IJ SOLR: 2.0000 mg | Freq: Once | INTRAMUSCULAR | Status: DC | PRN

## 2015-05-12 MED ORDER — IOHEXOL 300 MG/ML  SOLN
25.0000 mL | INTRAMUSCULAR | Status: AC
  Administered 2015-05-12 (×2): 25 mL via ORAL

## 2015-05-12 MED ORDER — LIDOCAINE HCL (PF) 1 % IJ SOLN
5.0000 mL | INTRAMUSCULAR | Status: DC | PRN
Start: 2015-05-12 — End: 2015-05-12

## 2015-05-12 MED ORDER — HYDROMORPHONE HCL 1 MG/ML IJ SOLN
INTRAMUSCULAR | Status: AC
  Filled 2015-05-12: qty 1

## 2015-05-12 MED ORDER — IOHEXOL 300 MG/ML  SOLN
100.0000 mL | Freq: Once | INTRAMUSCULAR | Status: AC | PRN
  Administered 2015-05-12: 100 mL via INTRAVENOUS

## 2015-05-12 MED ORDER — HEPARIN SODIUM (PORCINE) 1000 UNIT/ML DIALYSIS
5000.0000 [IU] | Freq: Once | INTRAMUSCULAR | Status: DC
  Filled 2015-05-12: qty 5

## 2015-05-12 MED ORDER — BISACODYL 10 MG RE SUPP: 10.0000 mg | Freq: Every day | RECTAL | Status: DC | PRN

## 2015-05-12 MED ORDER — HEPARIN SODIUM (PORCINE) 1000 UNIT/ML DIALYSIS: 1000.0000 [IU] | INTRAMUSCULAR | Status: DC | PRN

## 2015-05-12 MED ORDER — DARBEPOETIN ALFA 100 MCG/0.5ML IJ SOSY
PREFILLED_SYRINGE | INTRAMUSCULAR | Status: AC
  Administered 2015-05-12: 100 ug via INTRAVENOUS
  Filled 2015-05-12: qty 0.5

## 2015-05-12 MED ORDER — LIDOCAINE-PRILOCAINE 2.5-2.5 % EX CREA: 1.0000 "application " | TOPICAL_CREAM | CUTANEOUS | Status: DC | PRN

## 2015-05-12 NOTE — Discharge Summary (Signed)
Physician Discharge Summary  [name] TKW:[id number] DOB: 12-Nov-1965 DOA: 05/09/2015  PCP: [name], MD  Admit date: 05/09/2015 Discharge date: 05/12/2015  Time spent: Greater than 30 minutes  Recommendations for Outpatient Follow-up:  1. Maurice Small, PCP in 5 days. 2. Hemodialysis Center: keep regular appointments on MWF. 3. Recommend outpatient infectious disease consultation and follow-up regarding evaluation and management of hepatitis C.  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  Filed Weights   05/11/15 2100 05/12/15 0758 05/12/15 1214  Weight: 77.264 kg (170 lb 5.4 oz) 79.8 kg (175 lb 14.8 oz) 76.2 kg (167 lb 15.9 oz)    History of present illness:  49 y.o. female with PMH of hypertension, hyperlipidemia, diabetes mellitus, GERD, depression, anxiety, seizure, combined systolic and diastolic congestive heart failure (EF of 40%), schizophrenia, bipolar disorder, anemia, CKD-V (started HD recently), gallstone, who presents with abdominal pain , nausea, vomiting and left arm pain. She underwent tunneled HD catheter placement on 9/28 by IR and had left brachial forearm AV graft placement on 9/29 by Dr. Scot Dock. Reports that she has been having abdominal pain over right upper quadrant. It started in her previous admission (9/26-10/2). She had history of gallstone. She did HIDA on 05/02/15 which  was negative. Gen Surgery was consulted however recommended that there was no indication for cholecystectomy or any surgical intervention at that time.In ED, patient was found to have elevated lipase 150, WBCs 3.5, hemoglobin 9.1on 9/29-->9.8, temperature normal, no tachycardia, creatinine 6.61, BUN 28, ALT 112, AST 44, ALT 53, total bilirubin 1.0. Patient is admitted to inpatient for further evaluation and treatment.  Hospital Course:   RUQ abdominal pain/possible chronic pain syndrome - DD : biliary disease, pancreatitis - seems less likely, Gastritis/esophagitis/PUD, uremia maybe contributing versus other etiologies - treated supportively with bowel rest / liquids, IV fluids and pain management - Abdominal ultrasound: cholelithiasis , ? Cirrhosis and no hydronephrosis. - Recent HIDA scan negative - GI input appreciated: suspect biliary colic as the most likely explanation off for chronic symptoms  - EGD negative. GI recommends surgical reevaluation-called. Lipase has normalized. - Surgery evaluated and did not see any surgical needs and signed off. - To further evaluate, obtained CT of the abdomen after discussion with nephrology. There were no acute findings to explain abdominal pain except fairly large amount of stool in the rectosigmoid. To the nephrologist she volunteered regarding "chronic pain syndrome" and expressed concern regarding getting pain medications. She apparently is followed at the pain clinic. - Her abdominal pain may also be related to constipation. Although patient kept complaining of significant degree of pain, she was seen ambulating comfortably in the room without pain and tolerated diet. On day of discharge, her pain had significantly improved or even resolved. Counseled regarding outpatient bowel regimen.  Cholelithiasis - As above. Surgery did not see a role for cholecystectomy.  ESRD - Nephrology  consulted for HD needs. Missed HD 10/4 - Status post  tunneled HD catheter placement 9/28 and left brachial forearm AV graft placement 9/29 - Dr. Scot Dock saw patient in the hospital and indicated that the left forearm AVG was working well.  Bipolar disorder - Seems stable. Does not seem to be on any medications at home. No suicidal or homicidal ideations or delusions or hallucinations reported.  Hepatitis C by history and ? Cirrhosis by ultrasound - HCV antibody >11 (positive). HCV RNA quantitative:3510000 IU/ml. Hepatitis C genotype: 1a. 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 Monitor and adjust as needed - Last A1c : 10.3 on 05/05/15 suggesting poor control. - Hypoglycemia secondary to insulin's and poor oral intake from dietary restrictions related to abdominal pain. Reduce Lantus from 25 units to 15 units at bedtime and continue sensitive SSI. Monitor closely. - She then became hyperglycemic. Patient will return on prior home dose of insulin's.  Essential hypertension - Controlled on amlodipine (increased from 5-10 mg ) and carvedilol.  Pancytopenia - Unclear etiology. Follow CBCs. Anemia may be due to chronic disease/chronic kidney disease. Continue iron supplements and folate.Stable. Close outpatient follow up, evaluation and management across dialysis. Discussed with Dr. Jonnie Finner who will arrange f/u at HD.  Hyponatremia - Probably related to ESRD. Monitor across HD.Stable.   Left forearm pain - At site of recent AV fistula placement. No acute findings. Symptomatic treatment - see above.   Chronic combined systolic and diastolic CHF - LVEF by 2-D echo 08/26/14 : 40%. Mild volume overload- manage across dialysis  Seizure disorder - Continue Keppra  GERD - Continue PPI    Consultants:  [facility]  Nephrology  Procedures:  EGD 10/6: ENDOSCOPIC IMPRESSION: 1. Right Upper Quadrant Pain/stool  positive for FOB. EGD normal. Suspect that the abdominal pain is from her gallbladder. RECOMMENDATIONS: suggest that she be reevaluated by surgery. Continue PPI.  Antibiotics:  none   Discharge Exam:  Complaints: Abdominal pain improved or even resolved. Tolerating diet. Flatus +. BM couple days ago.   Filed Vitals:   05/12/15 1100 05/12/15 1130 05/12/15 1200 05/12/15 1214  BP: 138/76 131/67 124/63 154/76  Pulse: 86 88 87 97  Temp:    98.1 F (36.7 C)  TempSrc:    Oral  Resp:    16  Weight:    76.2 kg (167 lb 15.9 oz)  SpO2:    97%    General exam: Pleasant young female lying comfortably ambulating in room . Respiratory system: Clear. No increased work of breathing. Cardiovascular system: S1 & S2 heard, RRR. No JVD, murmurs, gallops, clicks or pedal edema.  Gastrointestinal system: Abdomen is nondistended, soft and nontender. Normal bowel sounds heard. Central nervous system: Alert and oriented. No focal neurological deficits. Extremities: Symmetric 5 x 5 power. Left forearm AVG with positive bruit , mild postop edema but good distal peripheral pulses. No signs to suggest infection.  Discharge Instructions      Discharge Instructions    AMB Referral to [place] [facility]    Complete by:  As directed   Please assign to [place]. Has had x4 admits in past 6 months. Consents signed. Will be new to outpatient HD when she discharges which is supposed to be on M,W,F. Multiple co-morbidities warranting post transition of care. Currently in hospital. Please call with questions. [name], [place], [facility] [phone]  Reason for consult:  Please  assign to [place]  Diagnoses of:  Diabetes  Expected date of contact:  1-3 days (reserved for hospital discharges)     Call MD for:  difficulty breathing, headache or visual disturbances    Complete by:  As directed      Call MD for:  extreme fatigue    Complete by:  As directed      Call MD  for:  hives    Complete by:  As directed      Call MD for:  persistant dizziness or light-headedness    Complete by:  As directed      Call MD for:  persistant nausea and vomiting    Complete by:  As directed      Call MD for:  redness, tenderness, or signs of infection (pain, swelling, redness, odor or green/yellow discharge around incision site)    Complete by:  As directed      Call MD for:  severe uncontrolled pain    Complete by:  As directed      Call MD for:  temperature >100.4    Complete by:  As directed      Diet - low sodium heart healthy    Complete by:  As directed      Diet Carb Modified    Complete by:  As directed      Increase activity slowly    Complete by:  As directed             Medication List    STOP taking these medications        aspirin 325 MG EC tablet      TAKE these medications        ACCU-CHEK AVIVA PLUS W/DEVICE Kit  1 each by Does not apply route 4 (four) times daily - after meals and at bedtime.     accu-chek softclix lancets  Use as instructed     amLODipine 5 MG tablet  Commonly known as:  NORVASC  Take 2 tablets (10 mg total) by mouth at bedtime.     atorvastatin 40 MG tablet  Commonly known as:  LIPITOR  Take 1 tablet (40 mg total) by mouth at bedtime.     bisacodyl 10 MG suppository  Commonly known as:  DULCOLAX  Place 1 suppository (10 mg total) rectally daily as needed for moderate constipation.     carvedilol 25 MG tablet  Commonly known as:  COREG  Take 1 tablet (25 mg total) by mouth 2 (two) times daily.     diphenhydrAMINE 25 mg capsule  Commonly known as:  BENADRYL  Take 1 capsule (25 mg total) by mouth every 6 (six) hours as needed for itching.     ferrous sulfate 325 (65 FE) MG tablet  Take 1 tablet (325 mg total) by mouth daily with breakfast.     folic acid 045 MCG tablet  Commonly known as:  FOLVITE  Take 200 mcg by mouth every morning.     glucose blood test strip  Commonly known as:  ACCU-CHEK AVIVA  PLUS  Use three times before meals and at bedtime     insulin aspart 100 UNIT/ML injection  Commonly known as:  novoLOG  Inject 2-10 Units into the skin 3 (three) times daily before meals. SSI: 0-200 = 2 units, 201-250 = 4 units, 251-300 = 6 units, 301-350 = 8 units, 351-400 = 10 units     insulin glargine 100 UNIT/ML injection  Commonly known as:  LANTUS  Inject 0.25 mLs (25 Units total) into the skin at bedtime.     lactulose 10 GM/15ML solution  Commonly known as:  CHRONULAC  Take 15 mLs (10 g total) by mouth 3 (three) times daily.     levETIRAcetam 500 MG tablet  Commonly known as:  KEPPRA  Take 500 mg by mouth every 12 (twelve) hours.     multivitamin with minerals Tabs tablet  Take 1 tablet by mouth daily.     mupirocin cream 2 %  Commonly known as:  BACTROBAN  Apply topically daily.     oxyCODONE 5 MG immediate release tablet  Commonly known as:  Oxy IR/ROXICODONE  Take 1-2 tablets (5-10 mg total) by mouth every 6 (six) hours as needed for moderate pain or severe pain.     pantoprazole 40 MG tablet  Commonly known as:  PROTONIX  Take 1 tablet (40 mg total) by mouth 2 (two) times daily.     polyethylene glycol packet  Commonly known as:  MIRALAX / GLYCOLAX  Take 17 g by mouth 2 (two) times daily. Mix in 8 oz liquid and drink     promethazine 12.5 MG tablet  Commonly known as:  PHENERGAN  Take 1 tablet (12.5 mg total) by mouth every 6 (six) hours as needed for nausea or vomiting.       Follow-up Information    Follow up with WEBB, CAROL D, MD. Schedule an appointment as soon as possible for a visit in 5 days.   Specialty:  Family Medicine   Contact information:   [facility] [address] [address] [phone]       Follow up with Hemodialysis Center.   Why:  Keep regular dialysis appointments on Mons/Weds/Fri's.       The results of significant diagnostics from this hospitalization (including imaging, microbiology, ancillary and  laboratory) are listed below for reference.    Significant Diagnostic Studies: US Abdomen Complete  05/10/2015   CLINICAL DATA:  Right upper quadrant pain, nausea and vomiting for 2 weeks.  EXAM: ULTRASOUND ABDOMEN COMPLETE  COMPARISON:  None.  FINDINGS: Gallbladder: There is a 10 mm gallstone in the gallbladder lumen. There also is a small volume intraluminal sludge. There is mild gallbladder mural thickening, 4.6 mm. There is no pericholecystic fluid. The patient was not tender over the gallbladder.  Common bile duct: Diameter: 5 mm  Liver: There is generalized irregular coarsening of hepatic parenchymal echotexture without discrete lesion. There are subtle morphologic irregularities at the surface of the liver which raise the question of cirrhosis. There is no intrahepatic bile duct dilatation.  IVC: No abnormality visualized.  Pancreas: Unremarkable  Spleen: Upper normal in size.  No focal lesions.  Right Kidney: Length: 11.1 cm. Increased echogenicity suggesting medical renal disease. No hydronephrosis.  Left Kidney: Length: 11.3 cm. Increased echogenicity suggesting medical renal disease. No hydronephrosis.  Abdominal aorta: Upper and mid portions are normal. Distal abdominal aorta not visible.  Other findings: No ascites  IMPRESSION: 1. Cholelithiasis. Mild gallbladder mural thickening or without pericholecystic fluid or Murphy's sign. 2. Coarsened hepatic parenchymal echotexture and subtle morphologic irregularities raising the question of cirrhosis. No focal liver lesions. No bile duct dilatation. 3. Increased echogenicity of the renal parenchyma bilaterally, suggesting medical renal disease. No hydronephrosis.   Electronically Signed   By: Andreas Newport M.D.   On: 05/10/2015 02:33   Ct Abdomen Pelvis W Contrast  05/12/2015   CLINICAL DATA:  Right flank pain over the last  2 weeks. Dialysis patient.  EXAM: CT ABDOMEN AND PELVIS WITH CONTRAST  TECHNIQUE: Multidetector CT imaging of the abdomen and  pelvis was performed using the standard protocol following bolus administration of intravenous contrast.  CONTRAST:  156mL OMNIPAQUE IOHEXOL 300 MG/ML  SOLN  COMPARISON:  Ultrasound 05/01/2015 and previous  FINDINGS: Lung bases are clear except for mild scarring.  The liver has a normal appearance. No focal lesions. The gallbladder is collapsed. There is a small stone in the gallbladder neck. The spleen is at the upper limits of normal in size the pancreas is normal. The adrenal glands are normal the kidneys are fairly normal in size but are poorly/nonfunctional. No evidence of renal stone disease or obstruction. The aorta and IVC are normal. There are small retroperitoneal lymph nodes, probably not significant. Bladder is largely collapsed. There is a large amount of fecal matter in the rectosigmoid. No other bowel finding of note. There may be a small amount of free fluid in the pelvis, nonspecific. The appendix is normal. There appears to have been previous hysterectomy. No significant bone finding.  IMPRESSION: No acute finding to explain right flank pain.  The gallbladder is collapsed and contains at least 2 few small gallstones. No CT evidence of cystic duct obstruction or cholecystitis.  Poorly functioning/ non functioning kidneys. No acute renal process evident. No sign of stone disease or obstruction.  Fairly large amount of stool in the rectosigmoid region. No other bowel finding. Normal appearing appendix.   Electronically Signed   By: Nelson Chimes M.D.   On: 05/12/2015 17:51   Nm Hepato W/eject Fract  05/02/2015   CLINICAL DATA:  Right upper quadrant pain, cholelithiasis  EXAM: NUCLEAR MEDICINE HEPATOBILIARY IMAGING  TECHNIQUE: Sequential images of the abdomen were obtained out to 60 minutes following intravenous administration of radiopharmaceutical.  RADIOPHARMACEUTICALS:  Five mCi Tc-6m  Choletec IV  COMPARISON:  05/01/2015  FINDINGS: There is normal uptake throughout the liver immediately  following injection. Visualization of the biliary tree is noted at 20 minutes with common bile duct activity at 20 minutes as well. Small bowel activity is noted at 25 minutes. The gallbladder is visualized at 70 minutes with progressive filling over the next 45 minutes.  IMPRESSION: Normal uptake and excretion of biliary tracer.  Right upper Select proper NMHepatobiliary template.   Electronically Signed   By: Inez Catalina M.D.   On: 05/02/2015 16:14   Ir Fluoro Guide Cv Line Right  05/03/2015   INDICATION: 49 year old with end-stage renal disease and starting hemodialysis.  EXAM: FLUOROSCOPIC AND ULTRASOUND GUIDED PLACEMENT OF A TUNNELED DIALYSIS CATHETER  Physician: Stephan Minister. Henn, MD  FLUOROSCOPY TIME:  30 seconds, 11.3 mGy  MEDICATIONS: 1 mg Versed, 25 mcg fentanyl.  1 g vancomycin.  A radiology nurse monitored the patient for moderate sedation.  Vancomycin was given within two hours of incision. Vancomycin was given due to a penicillin allergy.  ANESTHESIA/SEDATION: Moderate sedation time: 30 minutes  PROCEDURE: Informed consent was obtained for placement of a tunneled dialysis catheter. The patient was placed supine on the interventional table. Ultrasound confirmed a patent right internal jugularvein. Ultrasound images were obtained for documentation. The right side of the neck was prepped and draped in a sterile fashion. The right side of the neck neck was anesthetized with 1% lidocaine. Maximal barrier sterile technique was utilized including caps, mask, sterile gowns, sterile gloves, sterile drape, hand hygiene and skin antiseptic. A small incision was made with #11 blade scalpel. A 21 gauge needle  directed into the right internal jugular vein with ultrasound guidance. A micropuncture dilator set was placed. A 19 cm tip to cuff Palindrome catheter was selected. The skin below the right clavicle was anesthetized and a small incision was made with an #11 blade scalpel. A subcutaneous tunnel was formed to  the vein dermatotomy site. The catheter was brought through the tunnel. The vein dermatotomy site was dilated to accommodate a peel-away sheath. The catheter was placed through the peel-away sheath and directed into the central venous structures. The tip of the catheter was placed in the right atrium with fluoroscopy. Fluoroscopic images were obtained for documentation. Both lumens were found to aspirate and flush well. The proper amount of heparin was flushed in both lumens. The vein dermatotomy site was closed using a single layer of absorbable suture and Dermabond. Gel-Foam was placed in the subcutaneous tract. The catheter was secured to the skin using Prolene suture.  FINDINGS: Catheter tip in the right atrium.  Estimated blood loss: Minimal  COMPLICATIONS: None  IMPRESSION: Successful placement of a right jugular tunneled dialysis catheter using ultrasound and fluoroscopic guidance.   Electronically Signed   By: Markus Daft M.D.   On: 05/03/2015 18:06   Ir US Guide Vasc Access Right  05/03/2015   INDICATION: 49 year old with end-stage renal disease and starting hemodialysis.  EXAM: FLUOROSCOPIC AND ULTRASOUND GUIDED PLACEMENT OF A TUNNELED DIALYSIS CATHETER  Physician: Stephan Minister. Henn, MD  FLUOROSCOPY TIME:  30 seconds, 11.3 mGy  MEDICATIONS: 1 mg Versed, 25 mcg fentanyl.  1 g vancomycin.  A radiology nurse monitored the patient for moderate sedation.  Vancomycin was given within two hours of incision. Vancomycin was given due to a penicillin allergy.  ANESTHESIA/SEDATION: Moderate sedation time: 30 minutes  PROCEDURE: Informed consent was obtained for placement of a tunneled dialysis catheter. The patient was placed supine on the interventional table. Ultrasound confirmed a patent right internal jugularvein. Ultrasound images were obtained for documentation. The right side of the neck was prepped and draped in a sterile fashion. The right side of the neck neck was anesthetized with 1% lidocaine. Maximal  barrier sterile technique was utilized including caps, mask, sterile gowns, sterile gloves, sterile drape, hand hygiene and skin antiseptic. A small incision was made with #11 blade scalpel. A 21 gauge needle directed into the right internal jugular vein with ultrasound guidance. A micropuncture dilator set was placed. A 19 cm tip to cuff Palindrome catheter was selected. The skin below the right clavicle was anesthetized and a small incision was made with an #11 blade scalpel. A subcutaneous tunnel was formed to the vein dermatotomy site. The catheter was brought through the tunnel. The vein dermatotomy site was dilated to accommodate a peel-away sheath. The catheter was placed through the peel-away sheath and directed into the central venous structures. The tip of the catheter was placed in the right atrium with fluoroscopy. Fluoroscopic images were obtained for documentation. Both lumens were found to aspirate and flush well. The proper amount of heparin was flushed in both lumens. The vein dermatotomy site was closed using a single layer of absorbable suture and Dermabond. Gel-Foam was placed in the subcutaneous tract. The catheter was secured to the skin using Prolene suture.  FINDINGS: Catheter tip in the right atrium.  Estimated blood loss: Minimal  COMPLICATIONS: None  IMPRESSION: Successful placement of a right jugular tunneled dialysis catheter using ultrasound and fluoroscopic guidance.   Electronically Signed   By: Markus Daft M.D.   On: 05/03/2015  18:06   US Abdomen Limited Ruq  05/01/2015   CLINICAL DATA:  Right upper quadrant abdominal pain. Nausea and vomiting for 5 days. Hepatitis. Chronic kidney disease.  EXAM: US ABDOMEN LIMITED - RIGHT UPPER QUADRANT  COMPARISON:  Renal ultrasound of 11/09/2014.  FINDINGS: Gallbladder:  Multiple gallstones, measuring up to 9 mm. Wall thickening, up to 5 mm. No pericholecystic edema. Murphy's sign not well evaluated; patient pre-medicated.  Common bile duct:   Diameter: Normal, 4 mm.  Liver:  Mildly heterogeneous hepatic echotexture, nonspecific.  Incidental note is made of renal hyperechogenicity.  IMPRESSION: 1. Gallstones with nonspecific gallbladder wall thickening. Sonographic Percell Miller sign could not the evaluated secondary to patient being pre-medicated. Acute cholecystitis cannot be entirely excluded. CT or nuclear medicine scintigraphy could be informative. 2. Renal echogenicity, suggesting medical renal disease.   Electronically Signed   By: Abigail Miyamoto M.D.   On: 05/01/2015 12:57    Microbiology: Recent Results (from the past 240 hour(s))  Surgical pcr screen     Status: None   Collection Time: 05/04/15  5:43 AM  Result Value Ref Range Status   MRSA, PCR NEGATIVE NEGATIVE Final   Staphylococcus aureus NEGATIVE NEGATIVE Final    Comment:        The Xpert SA Assay (FDA approved for NASAL specimens in patients over 73 years of age), is one component of a comprehensive surveillance program.  Test performance has been validated by [facility] for patients greater than or equal to 59 year old. It is not intended to diagnose infection nor to guide or monitor treatment.      Labs: Basic Metabolic Panel:  Recent Labs Lab 05/06/15 0612 05/07/15 0850 05/10/15 0011 05/10/15 0318 05/11/15 0613 05/12/15 0815  NA 128* 131* 127* 127* 136 132*  K 5.4* 3.7 4.0 4.3 3.2* 4.1  CL 100* 99* 91* 96* 99* 97*  CO2 21* $Remov'25 24 22 31 27  'mNCExI$ GLUCOSE 326* 258* 314* 298* 60* 105*  BUN 22* 15 28* 32* 6 13  CREATININE 5.14* 3.69* 6.61* 6.76* 3.16* 4.68*  CALCIUM 7.0* 7.3* 8.1* 8.1* 7.2* 7.6*  PHOS 3.4  --   --   --   --  4.5   Liver Function Tests:  Recent Labs Lab 05/06/15 0612 05/10/15 0011 05/10/15 0318 05/11/15 0613 05/12/15 0815  AST  --  44* 49* 47*  --   ALT  --  53 51 43  --   ALKPHOS  --  112 103 78  --   BILITOT  --  1.0 0.8 0.5  --   PROT  --  6.8 7.1 5.6*  --   ALBUMIN 2.0* 2.2* 2.1* 1.8* 2.0*    Recent Labs Lab  05/10/15 0011 05/11/15 0613  LIPASE 150* 47   No results for input(s): AMMONIA in the last 168 hours. CBC:  Recent Labs Lab 05/10/15 0011 05/10/15 0318 05/12/15 0511 05/12/15 1405  WBC 3.5* 3.5* 2.3* 2.4*  NEUTROABS 2.7  --   --   --   HGB 9.8* 10.6* 7.8* 10.2*  HCT 31.2* 32.0* 26.0* 34.2*  MCV 83.4 82.1 88.4 88.6  PLT 177 139* 121* 116*   Cardiac Enzymes: No results for input(s): CKTOTAL, CKMB, CKMBINDEX, TROPONINI in the last 168 hours. BNP: BNP (last 3 results)  Recent Labs  09/30/14 1324 10/27/14 2146 11/09/14 0251  BNP 61.6 63.7 242.0*    ProBNP (last 3 results)  Recent Labs  06/22/14 1532  PROBNP 28725.0*    CBG:  Recent Labs  Lab 05/11/15 1222 05/11/15 1712 05/11/15 2058 05/12/15 1254 05/12/15 1743  GLUCAP 117* 294* 310* 168* 371*      Signed:  Vernell Leep, MD, FACP, FHM. [facility] Pager [phone]  If 7PM-7AM, please contact night-coverage [email or web address] Password [email or web address] 05/12/2015, 6:16 PM

## 2015-05-12 NOTE — Procedures (Addendum)
STable, chronic abd pain, "I have chronic pain syndrome".  She is worried about getting pain medication and doesn't have pain clinic appt until November. She will not get any pain Rx's at outpatient dialysis.  For dc after HD today. The Hb has dropped in the hospital due to blood draws most likely but she is having abd pain and hasn't ever had an abdominal CT in our system. Have d/w primary, will get abd CT today, r/o RPH other source of bleeding/ pain.    I was present at this dialysis session, have reviewed the session itself and made  appropriate changes Kelly Splinter MD (pgr) [phone]    [phone] 05/12/2015, 12:35 PM

## 2015-05-12 NOTE — Discharge Instructions (Signed)

## 2015-05-12 NOTE — Discharge Summary (Signed)
[name] to be D/C'd Home per MD order.  Discussed with the patient and all questions fully answered.  VSS, Skin clean, dry and intact without evidence of skin break down, no evidence of skin tears noted. IV catheter discontinued intact. Site without signs and symptoms of complications. Dressing and pressure applied.  An After Visit Summary was printed and given to the patient..  D/c education completed with patient/family including follow up instructions, medication list, d/c activities limitations if indicated, with other d/c instructions as indicated by MD - patient able to verbalize understanding, all questions fully answered.   Patient instructed to return to ED, call 911, or call MD for any changes in condition.   Patient escorted via [place], and D/C home via private auto.  Audria Nine F 05/12/2015 7:10 PM

## 2015-05-14 IMAGING — DX DG CHEST 1V PORT
1 series · 1 of 1 positions shown · non-contrast
Comparison: 06/01/2013

CLINICAL DATA: Hyperglycemia

EXAM:
PORTABLE CHEST - 1 VIEW

[chest ap]
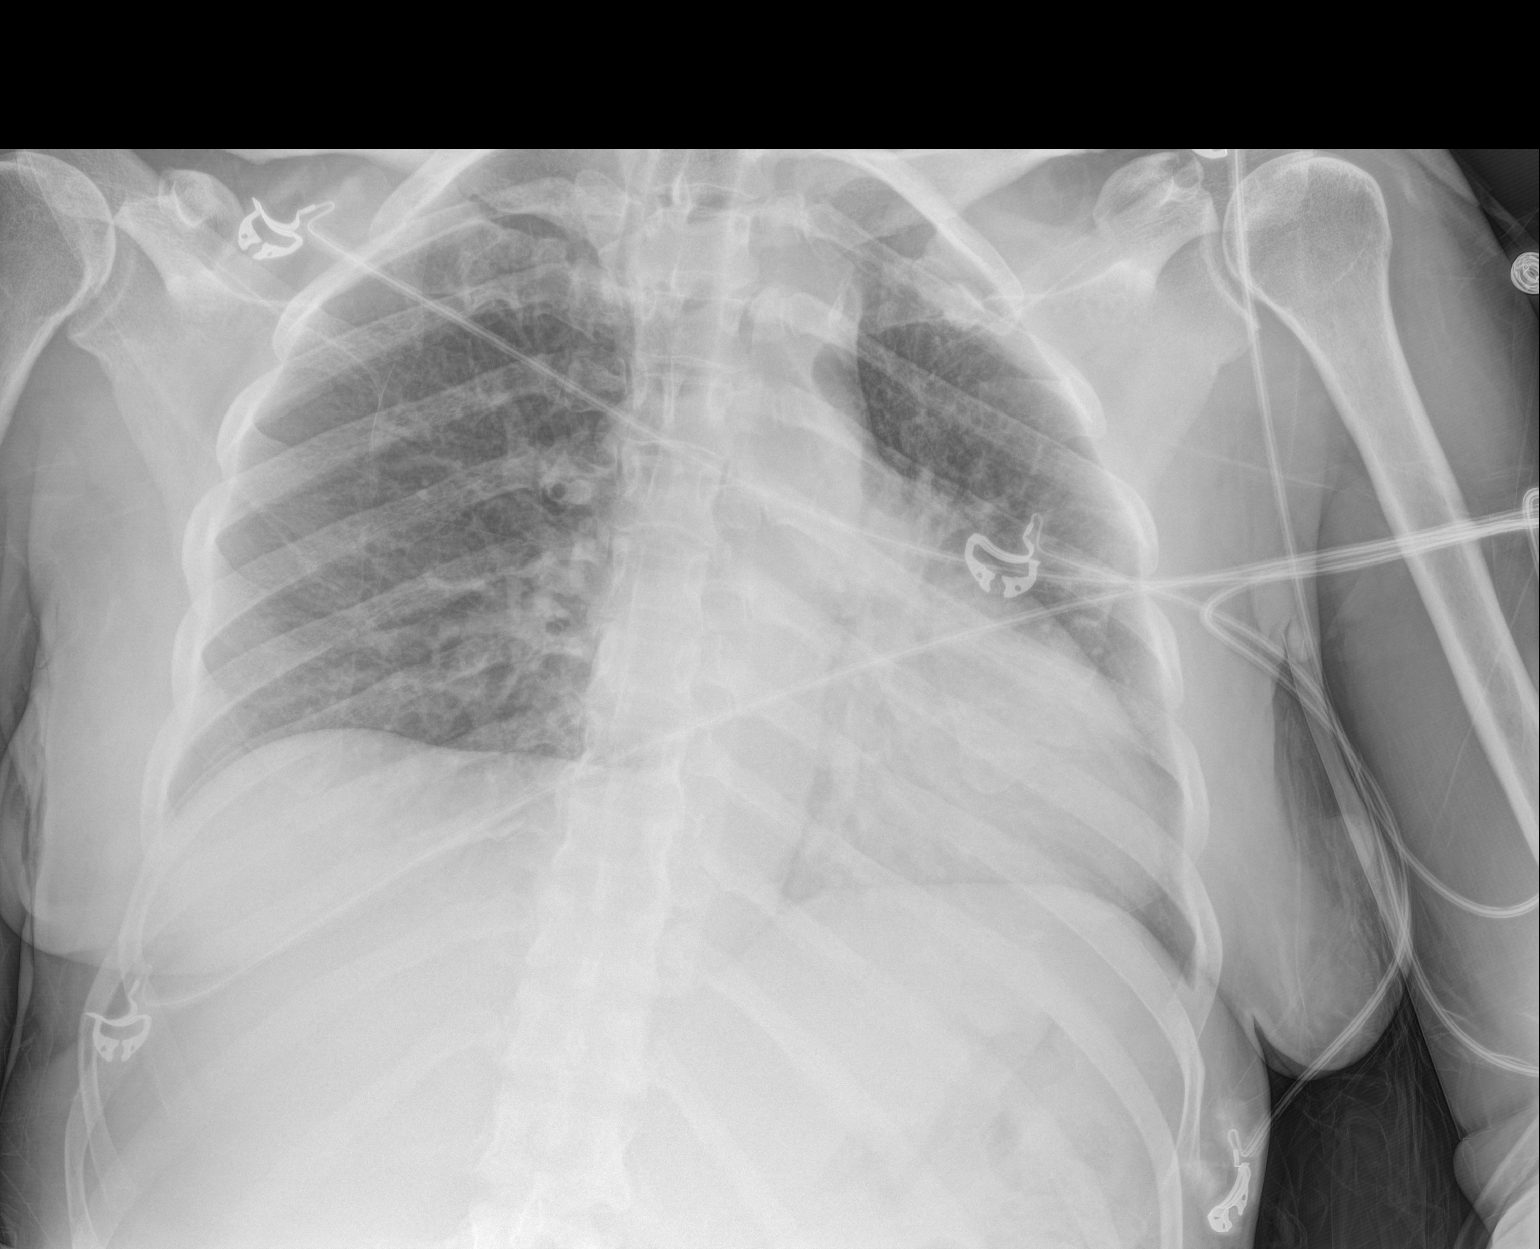

[1 of 1 positions shown; findings below may reference images not displayed]

FINDINGS: Cardiac enlargement without heart failure. Mild left lower lobe
atelectasis. Negative for pneumonia or effusion.
IMPRESSION: Mild left lower lobe atelectasis.

## 2015-05-14 IMAGING — MR MR HEAD W/O CM
8 of 11 series · 34 of 48 positions shown · non-contrast
Comparison: Head CT 02/02/2014 and MRI 02/13/2008

CLINICAL DATA: Seizure.  Altered mental status.

EXAM:
MRI HEAD WITHOUT CONTRAST
TECHNIQUE: Multiplanar, multiecho pulse sequences of the brain and surrounding
structures were obtained without intravenous contrast.

[Series 8: DWI · axial · 5.0mm · 1.09mm/px · z∈[-57,+87]mm · 7 of 60 slices shown (1 of 4)]
[im 1/60]
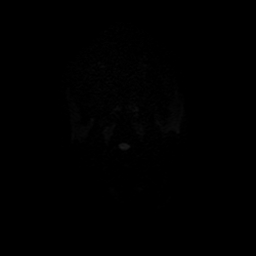
[im 10/60]
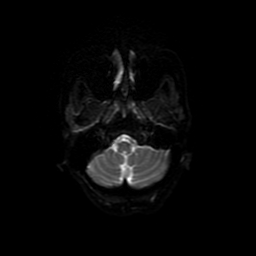
[im 20/60]
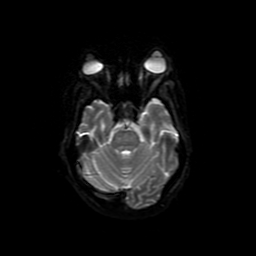
[im 30/60]
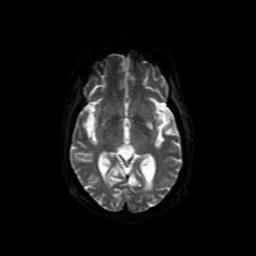
[im 40/60]
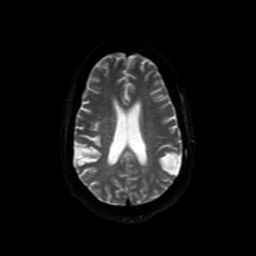
[im 50/60]
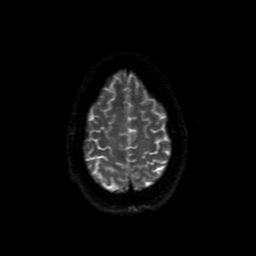
[im 60/60]
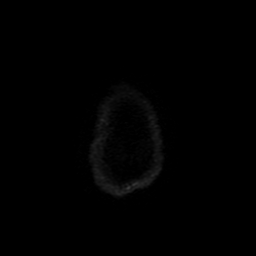

[Series 9: DWI · coronal · 6.0mm · 1.09mm/px · 7 of 60 slices shown (2 of 4)]
[im 1/60]
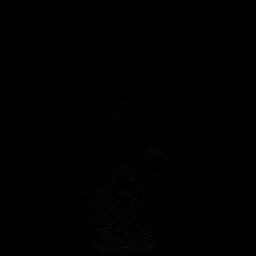
[im 10/60]
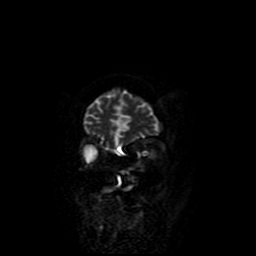
[im 20/60]
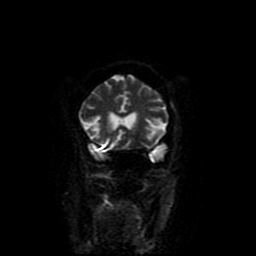
[im 30/60]
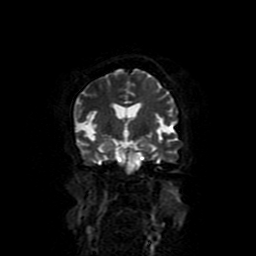
[im 40/60]
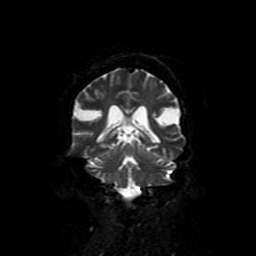
[im 50/60]
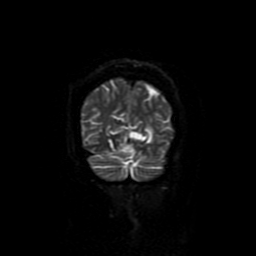
[im 60/60]
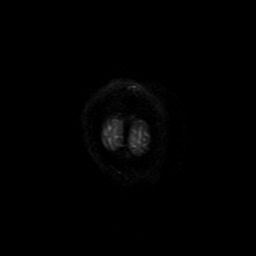

[Series 10: T2 · axial · 5.0mm · 0.43mm/px · z∈[-54,+93]mm · 3 of 24 slices shown (1 of 3)]
[im 1/24]
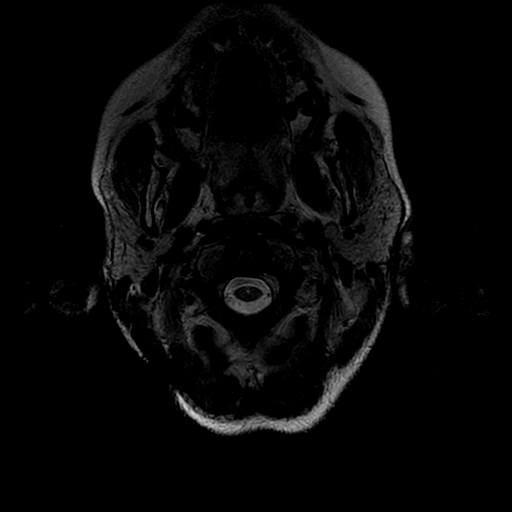
[im 12/24]
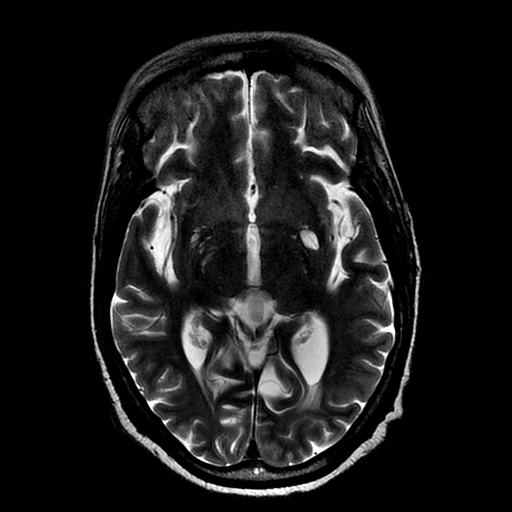
[im 24/24]
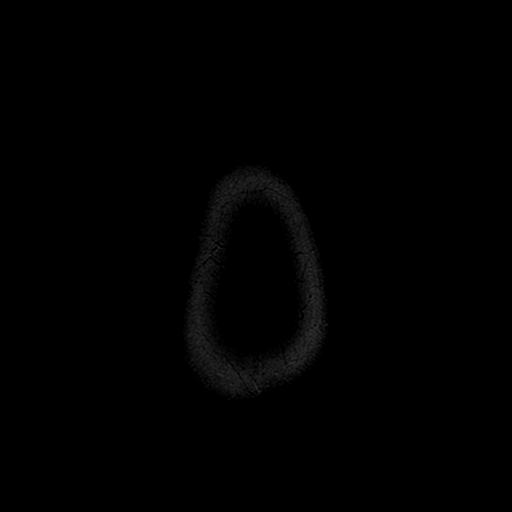

[Series 11: FLAIR · axial · 5.0mm · 0.43mm/px · z∈[-54,+93]mm · 3 of 24 slices shown]
[im 1/24]
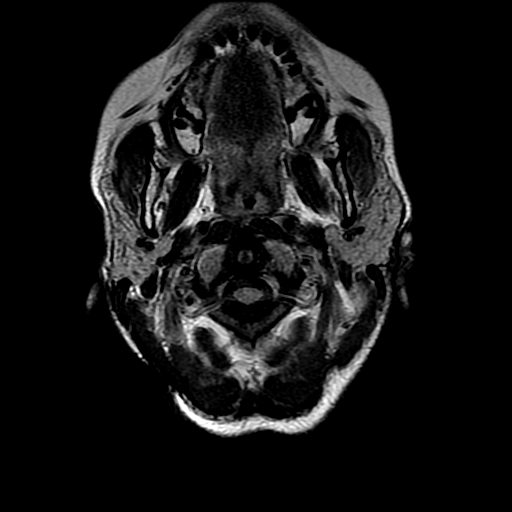
[im 12/24]
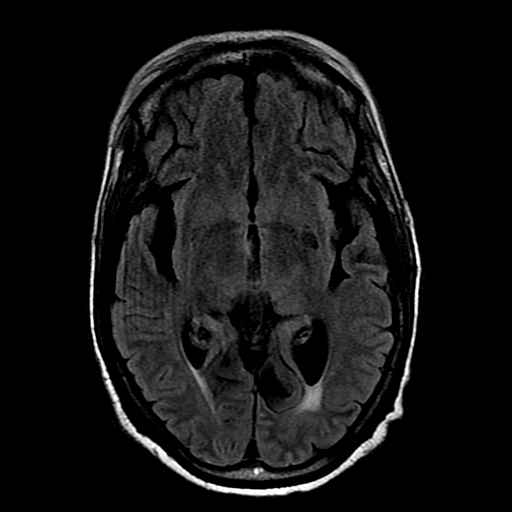
[im 24/24]
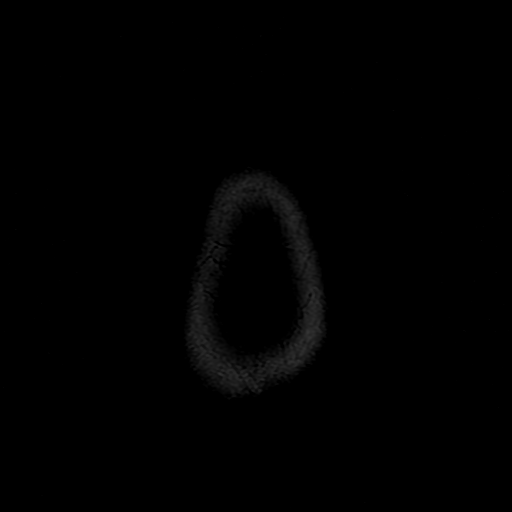

[Series 14: T2 · coronal · 5.0mm · 0.45mm/px · 3 of 28 slices shown (2 of 3)]
[im 1/28]
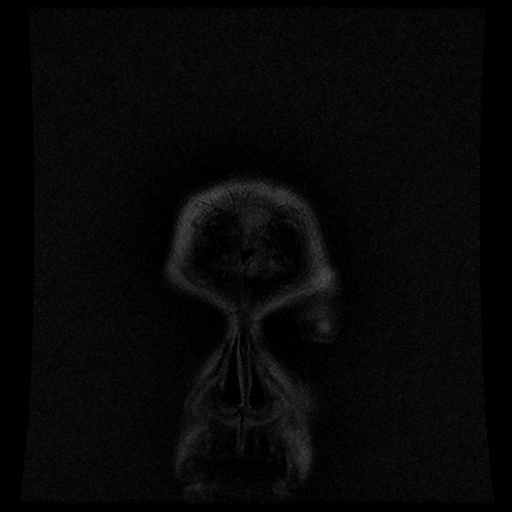
[im 14/28]
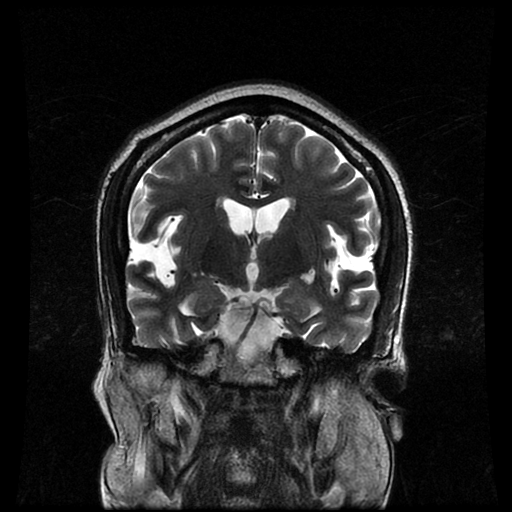
[im 28/28]
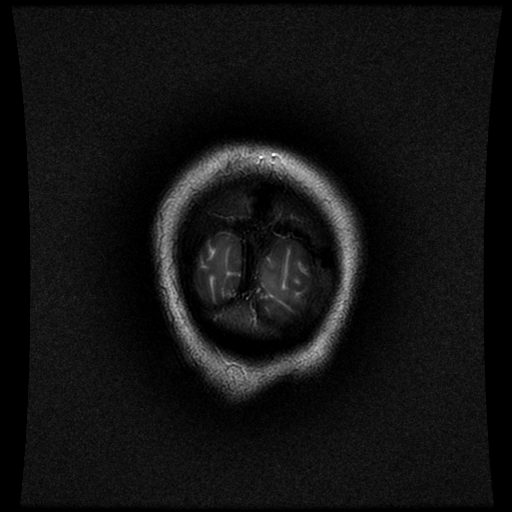

[Series 15: T2 · coronal · 3.0mm · 0.39mm/px · 3 of 33 slices shown (3 of 3)]
[im 1/33]
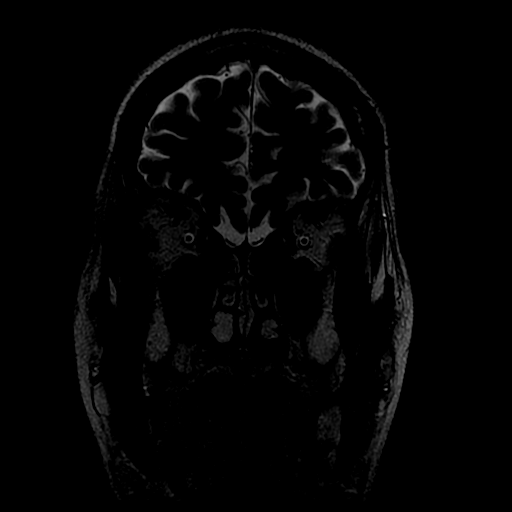
[im 11/33]
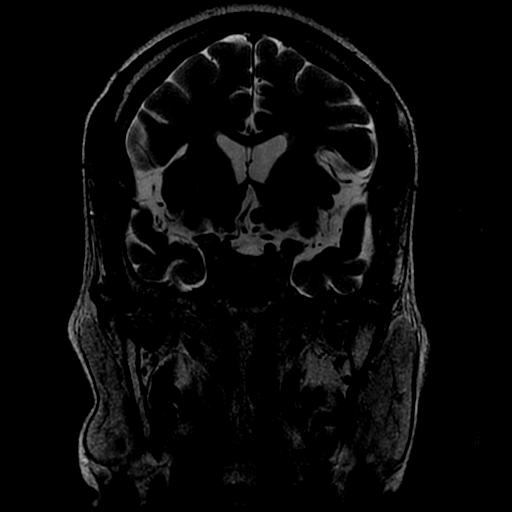
[im 22/33]
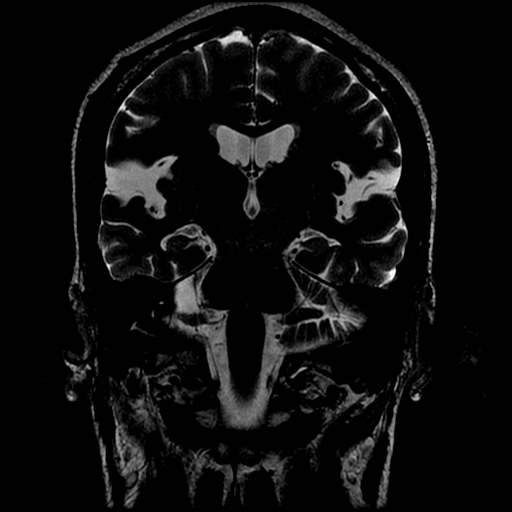

[Series 800: DWI · axial · 5.0mm · 1.09mm/px · z∈[-57,+87]mm · 4 of 30 slices shown (3 of 4)]
[im 1/30]
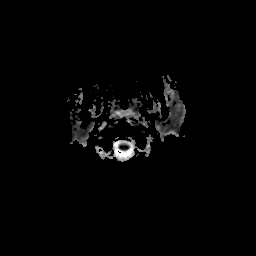
[im 10/30]
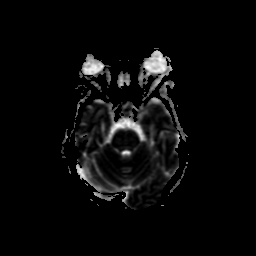
[im 20/30]
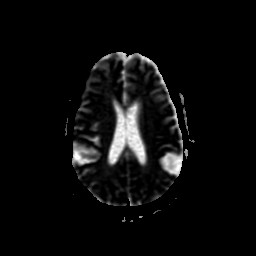
[im 30/30]
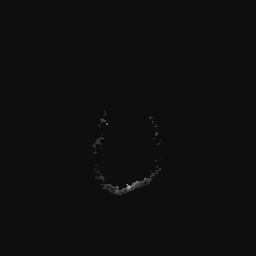

[Series 900: DWI · coronal · 6.0mm · 1.09mm/px · 4 of 30 slices shown (4 of 4)]
[im 1/30]
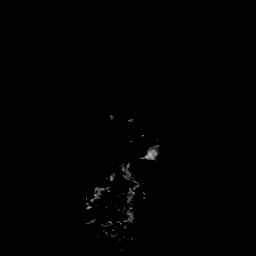
[im 10/30]
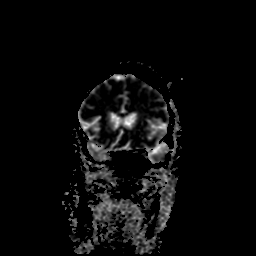
[im 20/30]
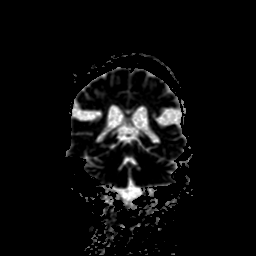
[im 30/30]
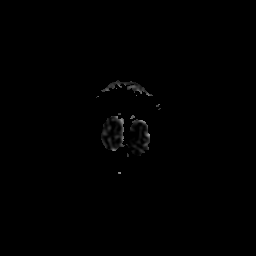

[34 of 48 positions shown; findings below may reference images not displayed]

FINDINGS: Dedicated thin section imaging through the temporal lobes
demonstrates normal volume and signal of the hippocampi. There is no
evidence of heterotopia.

There is no evidence of acute infarct, intracranial hemorrhage,
mass, midline shift, or extra-axial fluid collection. Symmetric,
temporoparietal predominant atrophy with enlargement of the sylvian
fissures is again seen and slightly progressed from the prior MRI.
Foci of T2 hyperintensity in the periventricular white matter and
pons are similar to the prior MRI and nonspecific but compatible
with mild chronic small vessel ischemic disease. Dilated
perivascular space at the inferior left basal ganglia is unchanged.

Prior bilateral cataract extraction is noted. Major intracranial
vascular flow voids are preserved. Paranasal sinuses and mastoid air
cells are clear.
IMPRESSION: 1. No evidence of acute intracranial abnormality or mass.
2. Cerebral atrophy as above, mildly progressed from prior MRI.

## 2015-05-14 IMAGING — US US RENAL
1 series · 14 of 25 positions shown · non-contrast
Comparison: None.

CLINICAL DATA: Acute renal failure.

EXAM:
RENAL/URINARY TRACT ULTRASOUND COMPLETE

[Series 1: us renal · 0.22mm/px · 14 of 42 slices shown]
[im 1/42]
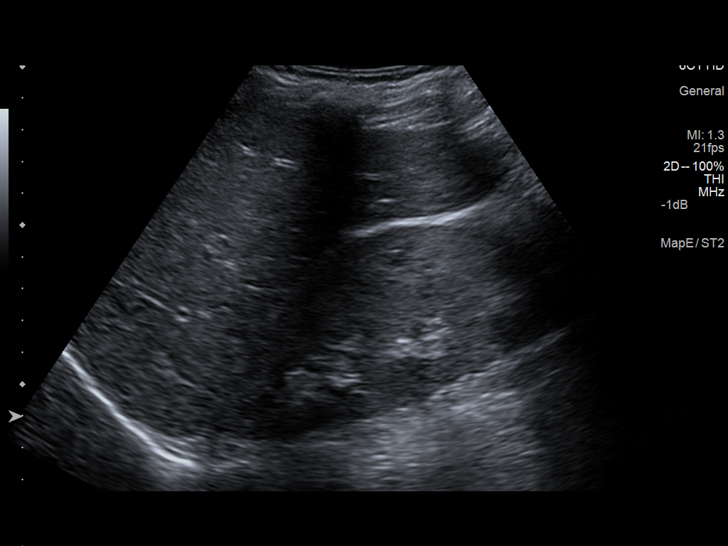
[im 4/42]
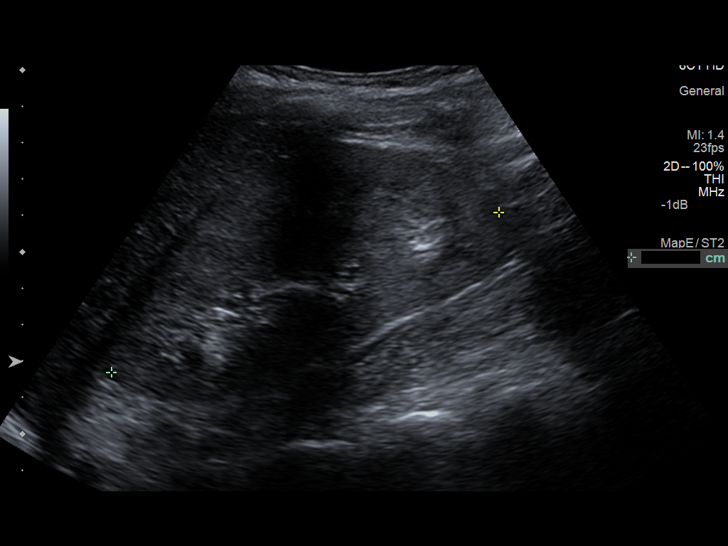
[im 7/42]
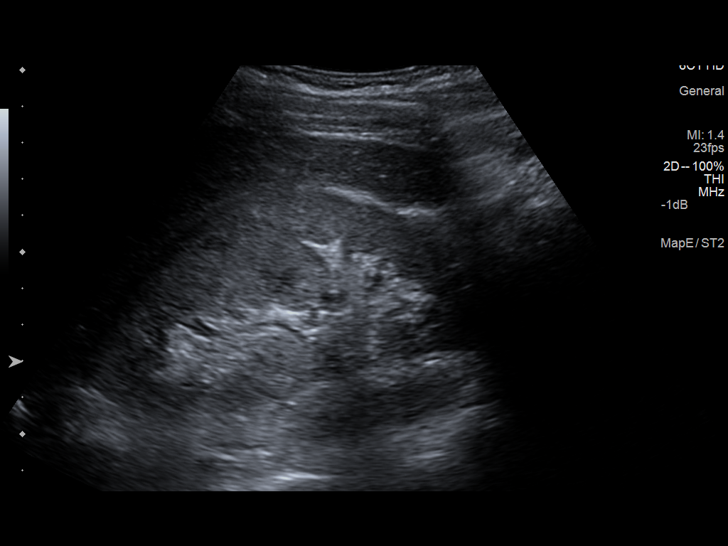
[im 11/42]
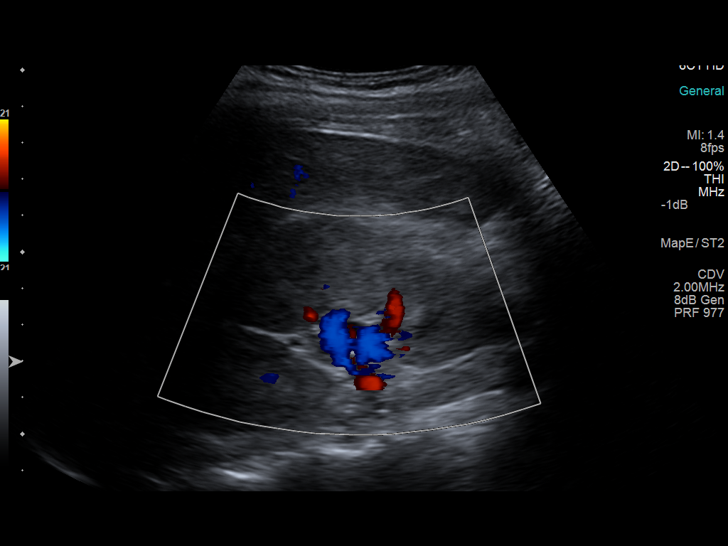
[im 14/42]
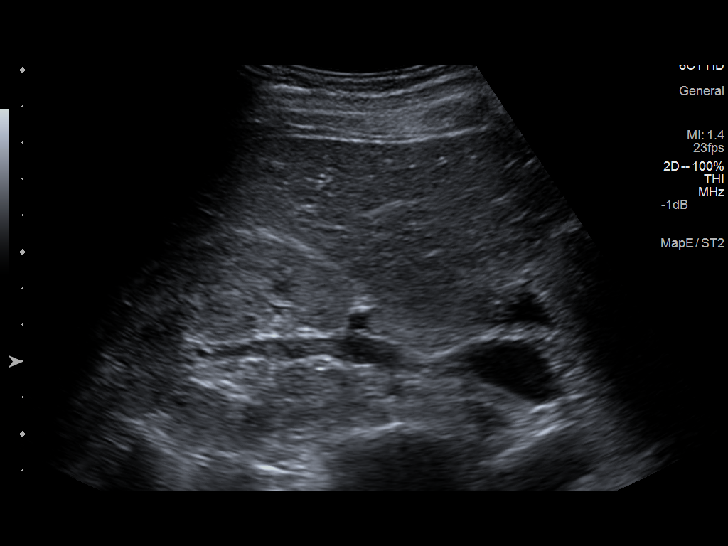
[im 16/42]
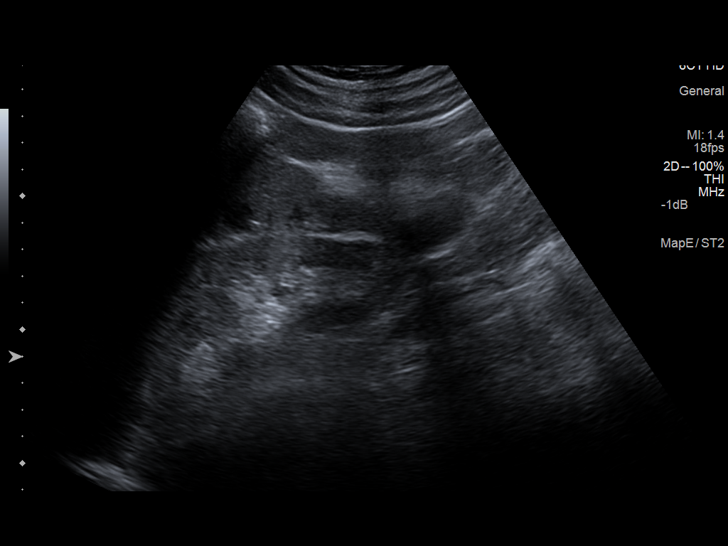
[im 19/42]
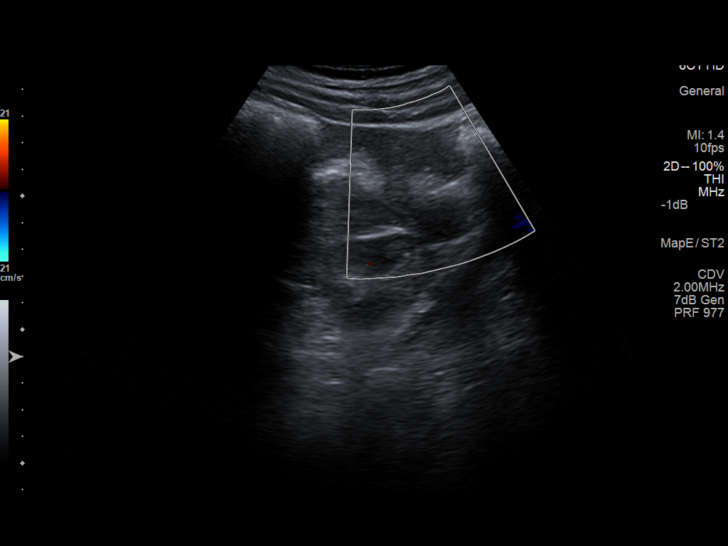
[im 23/42]
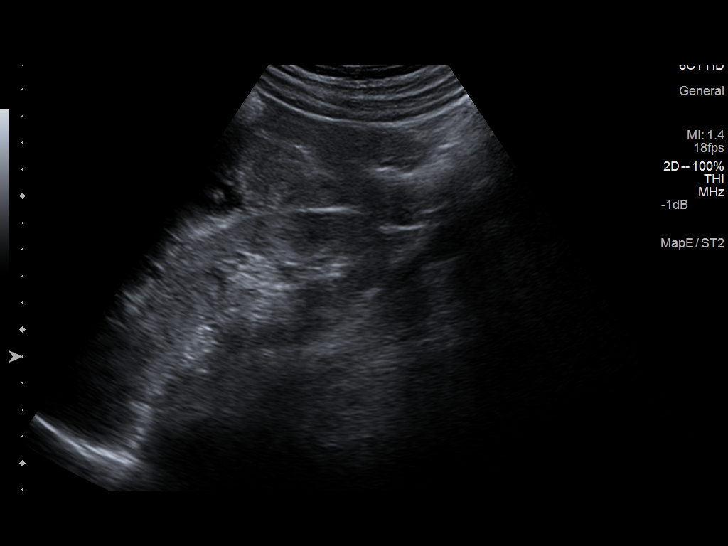
[im 26/42]
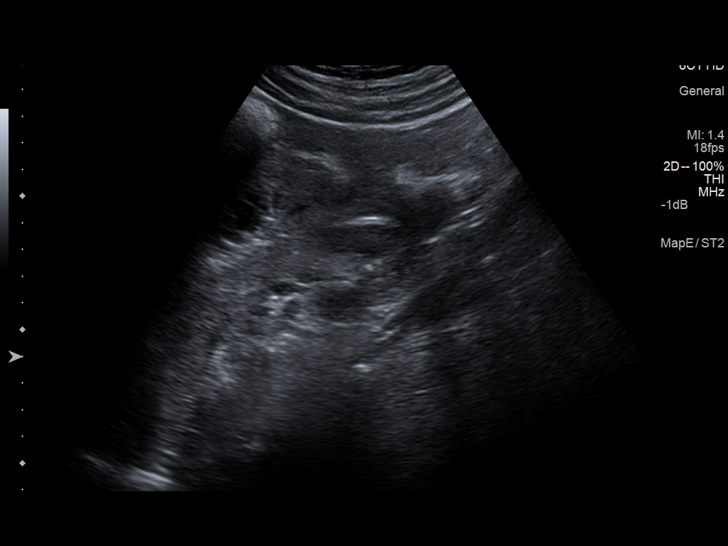
[im 28/42]
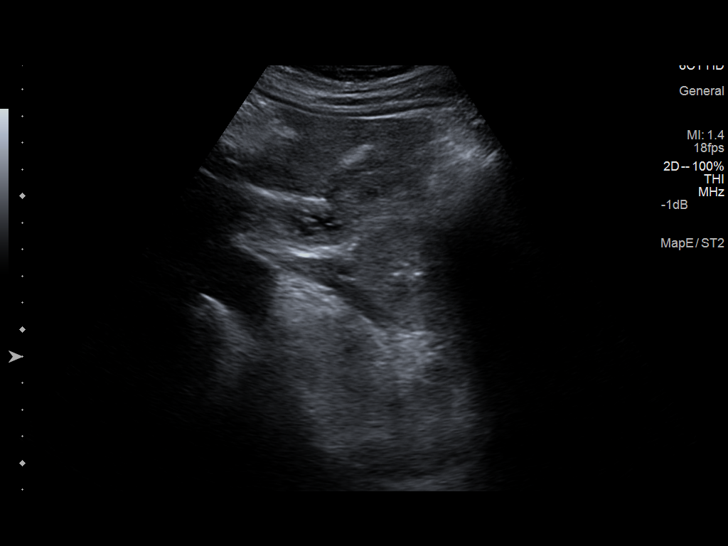
[im 31/42]
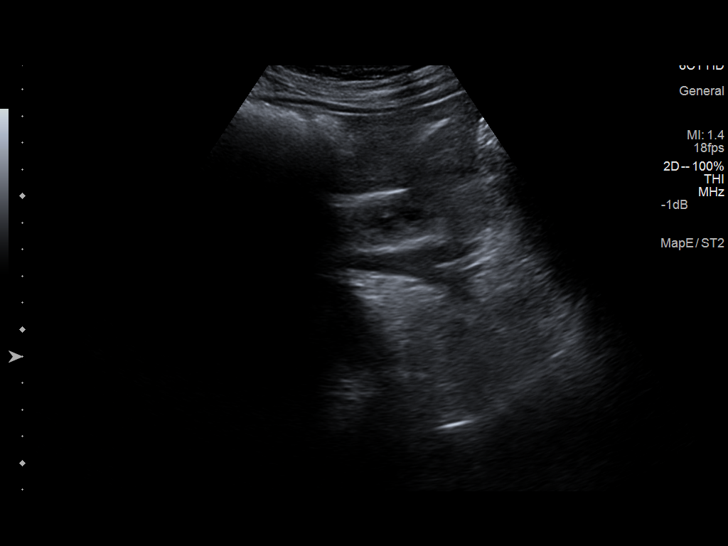
[im 35/42]
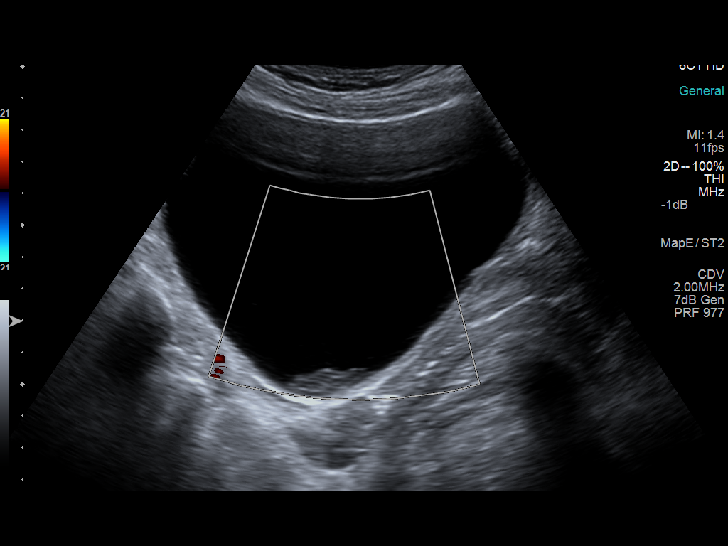
[im 38/42]
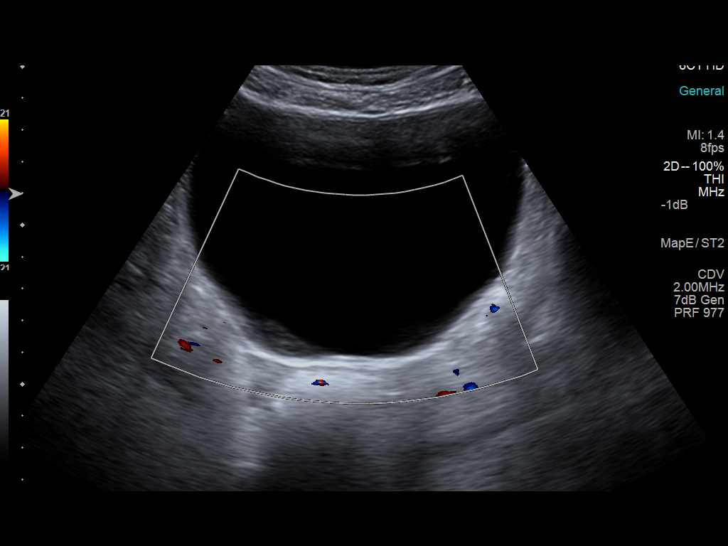
[im 42/42]
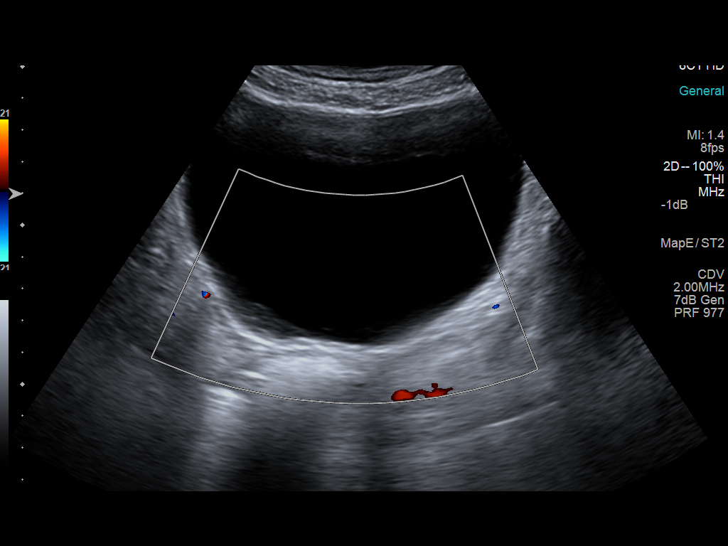

[14 of 25 positions shown; findings below may reference images not displayed]

FINDINGS: Right Kidney:

Length: 11.5 cm. Renal parenchyma is slightly echogenic suggesting
medical renal disease. No mass or hydronephrosis visualized.

Left Kidney:

Length: 10.6 cm. Renal parenchyma is slightly echogenic suggesting
medical renal disease. No mass or hydronephrosis visualized.

Bladder:

Is fully distended, with echogenic material seen in the base of the
bladder. No ureteral jets are noted.
IMPRESSION: Renal parenchyma is slightly echogenic bilaterally suggesting
medical renal disease. No hydronephrosis or renal obstruction is
noted.

## 2015-05-14 IMAGING — CT CT HEAD W/O CM
2 series · 16 of 30 positions shown, 20 images · non-contrast
Comparison: 02/09/2013

CLINICAL DATA: Seizure.  Altered mental status.

EXAM:
CT HEAD WITHOUT CONTRAST
TECHNIQUE: Contiguous axial images were obtained from the base of the skull
through the vertex without intravenous contrast.

[Series 2: head w/o · axial · non-contrast · 0.45mm/px · z∈[-135,-5]mm · 13 of 32 slices shown, 17 images]
[im 3/32  brain]
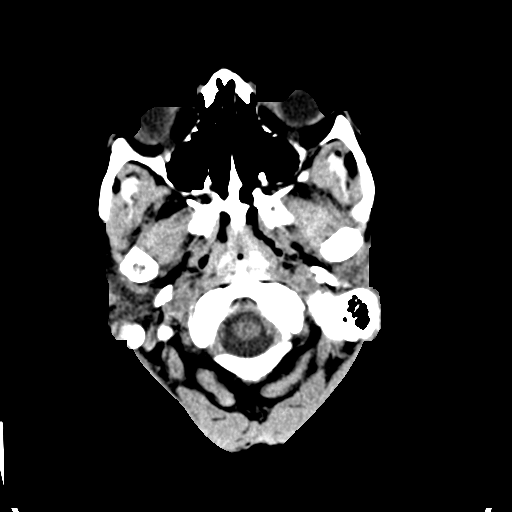
[im 3/32  bone]
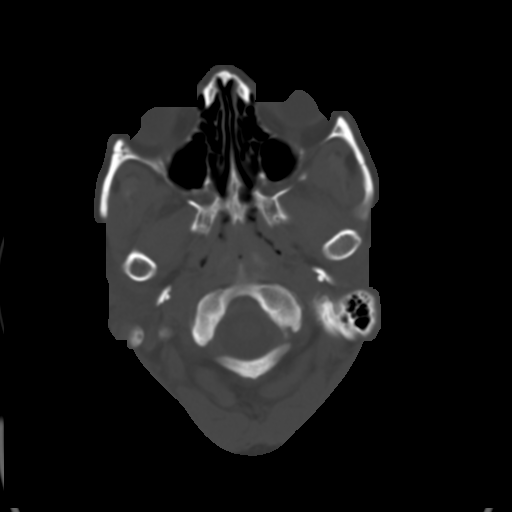
[im 5/32  brain]
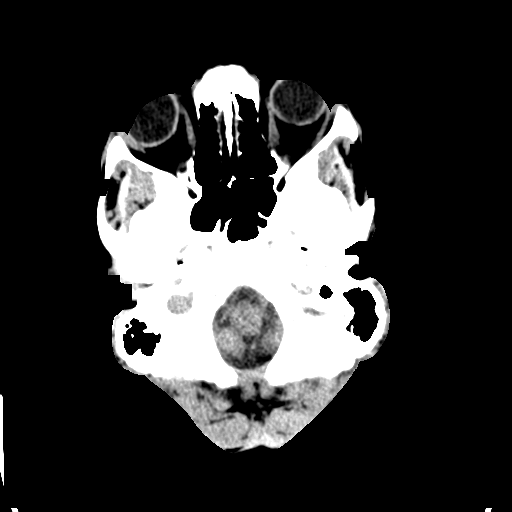
[im 7/32  brain]
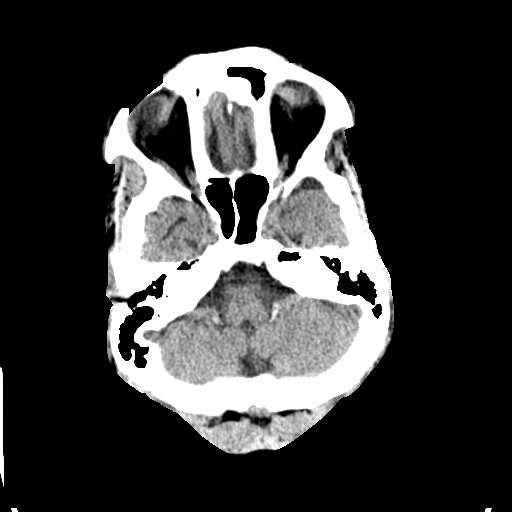
[im 9/32  brain]
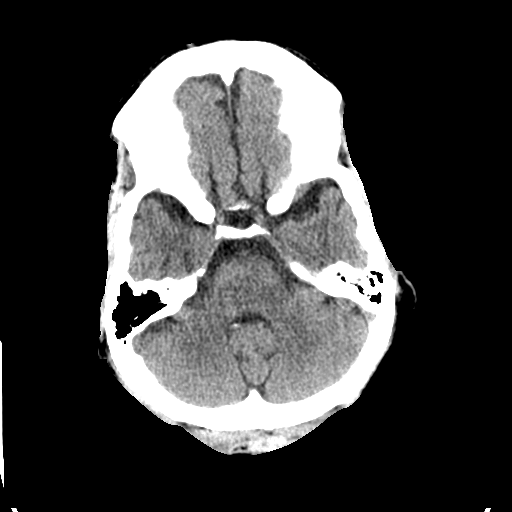
[im 12/32  brain]
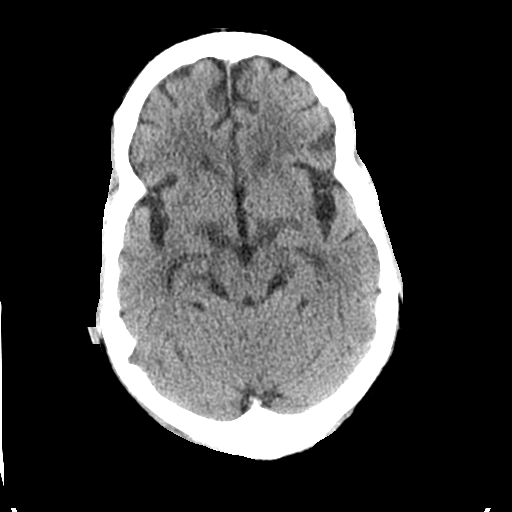
[im 12/32  bone]
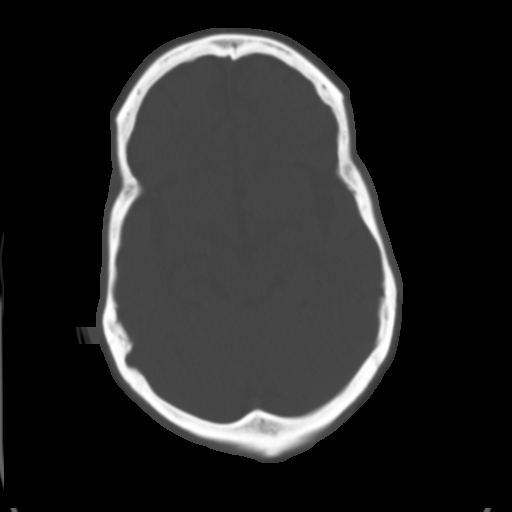
[im 14/32  brain]
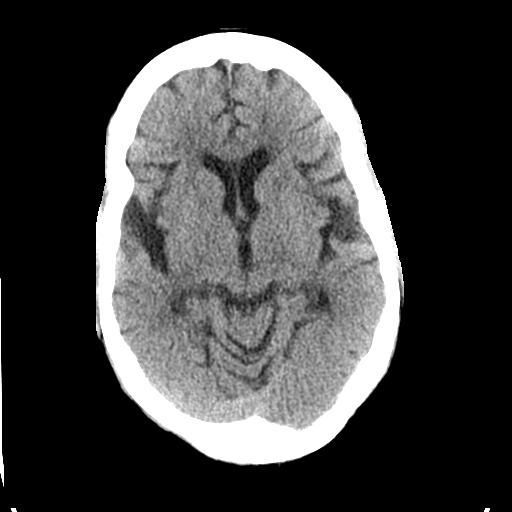
[im 16/32  brain]
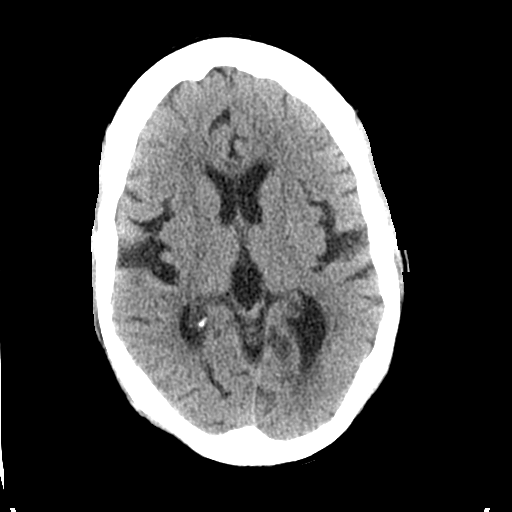
[im 18/32  brain]
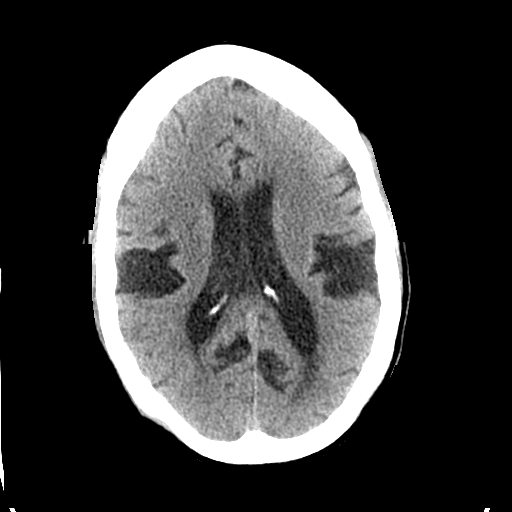
[im 20/32  brain]
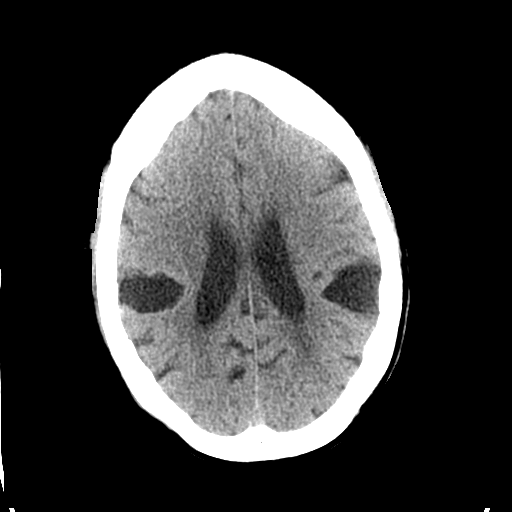
[im 20/32  bone]
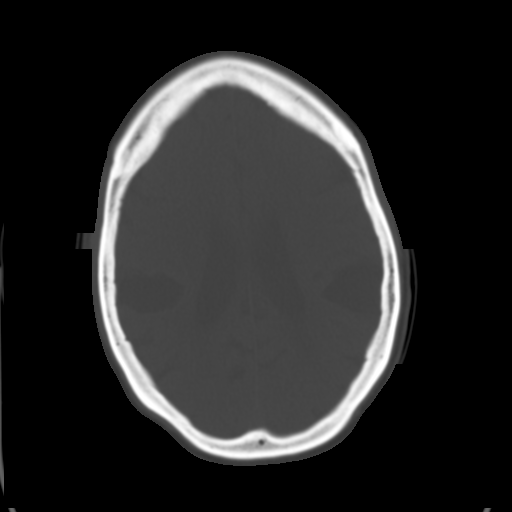
[im 23/32  brain]
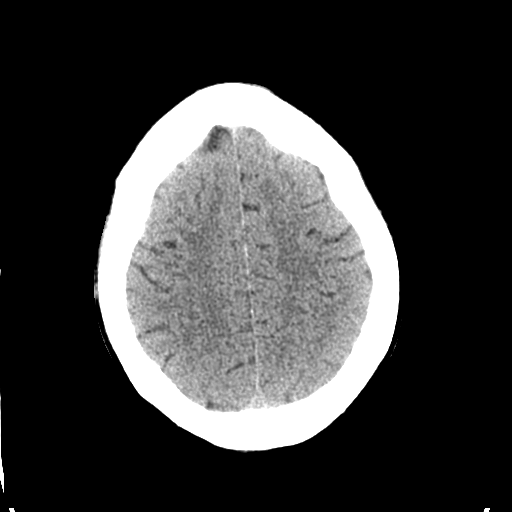
[im 25/32  brain]
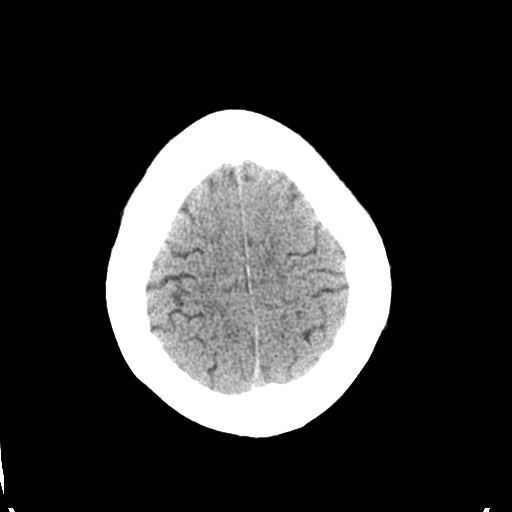
[im 27/32  brain]
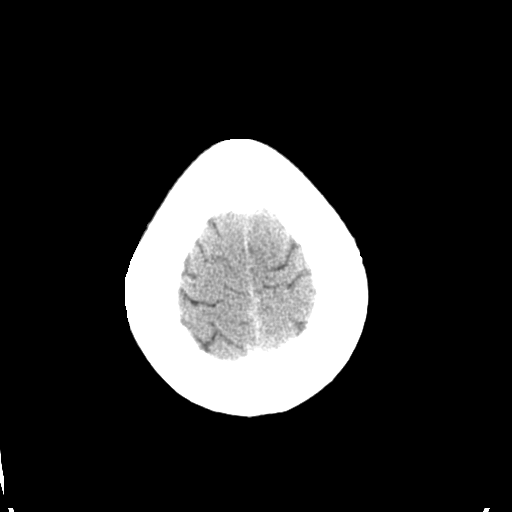
[im 29/32  brain]
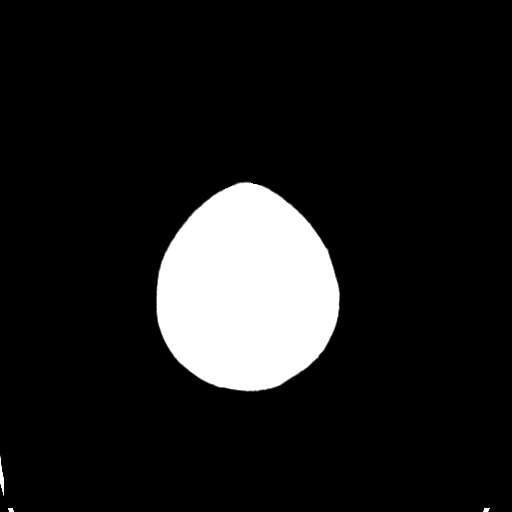
[im 29/32  bone]
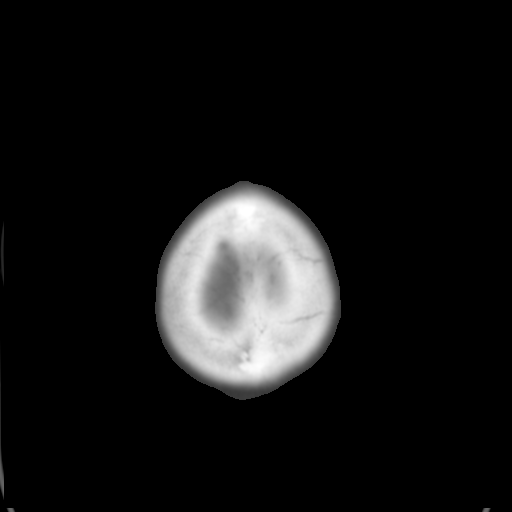

[Series 3: bone windows · axial · 0.45mm/px · z∈[-135,-90]mm · 3 of 32 slices shown]
[im 3/32  bone]
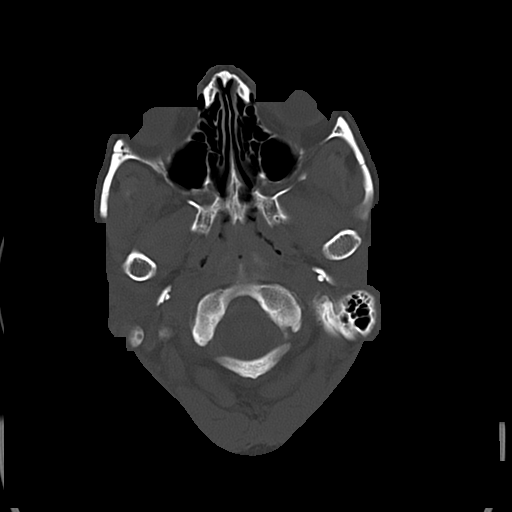
[im 7/32  bone]
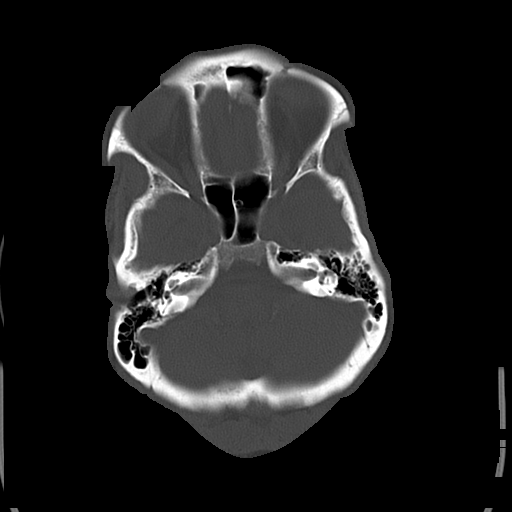
[im 12/32  bone]
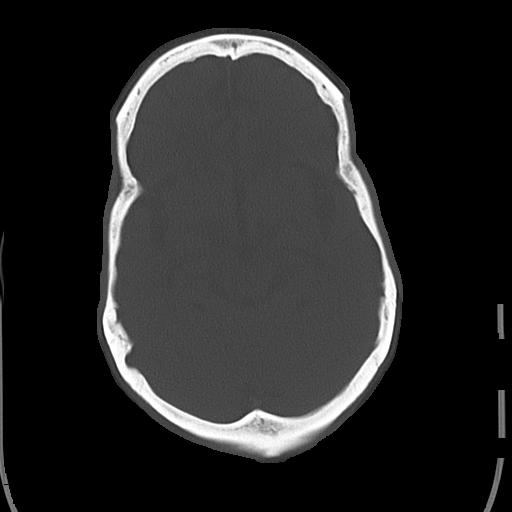

[16 of 30 positions shown; findings below may reference images not displayed]

FINDINGS: Skull and Sinuses:Negative for fracture or destructive process. The
mastoids, middle ears, and imaged paranasal sinuses are clear.

Orbits: Bilateral cataract resection.

Brain: No evidence of acute abnormality, such as acute infarction,
hemorrhage, hydrocephalus, or mass lesion/mass effect. There is
chronic enlargement of the sylvian fissures bilaterally. On brain
MRI from 0116 there was no definitive polymicrogyria in this region.
There is a prominent medial left occipital lobe sulcus, also stable.
Dilated perivascular space noted in the low left cerebrum.
IMPRESSION: 1. No acute intracranial findings.
2. Brain atrophy with a perisylvian predominance.

## 2015-05-15 ENCOUNTER — Other Ambulatory Visit: Payer: Self-pay

## 2015-05-15 LAB — HEPATITIS C GENOTYPE

## 2015-05-15 LAB — HCV RNA QUANT RFLX ULTRA OR GENOTYP
HCV RNA Qnt(log copy/mL): 6.545 log10 IU/mL
HepC Qn: 3510000 IU/mL

## 2015-05-15 NOTE — Patient Outreach (Signed)
New referral:  Placed initial transition of care call to patient. No answer. No machine.  PLAN: Will attempt again.  Tomasa Rand, RN, BSN, CEN [facility] [employer] [phone]

## 2015-05-17 ENCOUNTER — Other Ambulatory Visit: Payer: Self-pay | Admitting: *Deleted

## 2015-05-17 NOTE — Patient Outreach (Signed)
[facility]) Care Management  05/17/2015  [name] 01-Jan-1966 [id number]  Placed transition of care call, No answer, no answering machine, verified telephone number received from [facility] note.Noted patient has Hemodialysis on Monday, Wednesday and Friday.  Plan I will attempt to reach patient on later today or on Thursday  Joylene Draft, RN, [place] Management [phone]- Mobile [phone]- [place]

## 2015-05-22 ENCOUNTER — Other Ambulatory Visit: Payer: Self-pay

## 2015-05-22 NOTE — Patient Outreach (Signed)
3rd attempt at Transition of care :  Attempted to reach patient  x2 phone numbers. Unsuccessful. PLAN: Will Corporate treasurer and continue to attempt to reach patient.  Tomasa Rand, RN, BSN, CEN [facility] [employer] [phone]

## 2015-05-27 IMAGING — CR DG ELBOW COMPLETE 3+V*R*
5 series · 5 of 5 positions shown · non-contrast
Comparison: None.

CLINICAL DATA: Fall

EXAM:
RIGHT ELBOW - COMPLETE 3+ VIEW

[x elbow ap right]
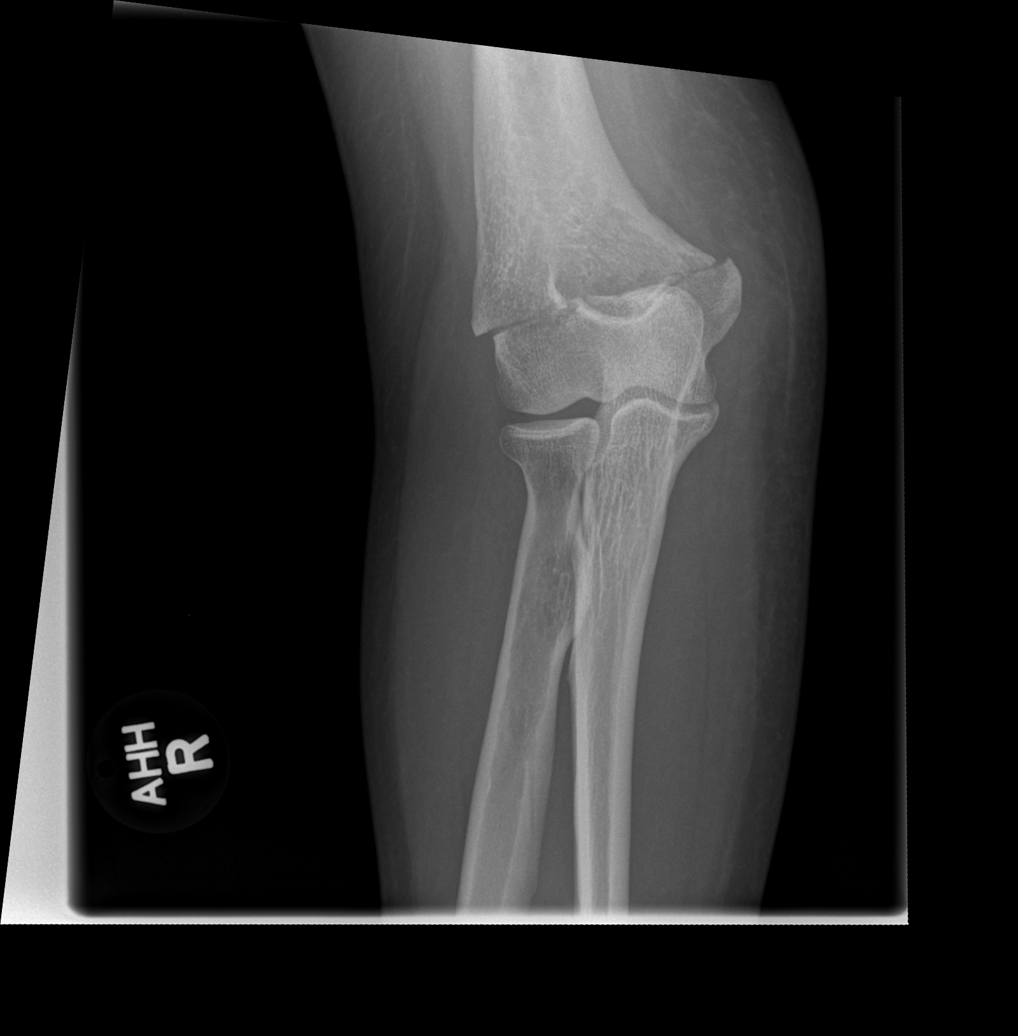

[x elbow lat right (1 of 4)]
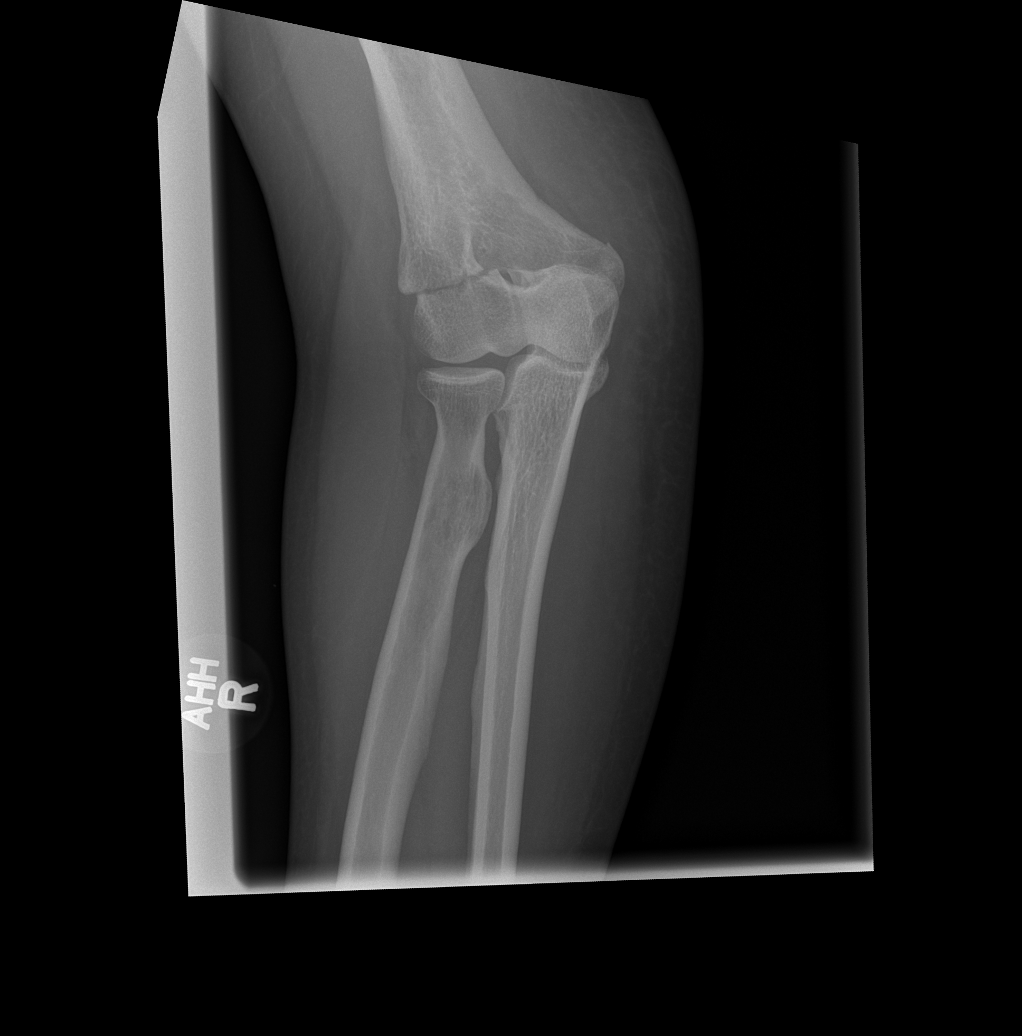

[x elbow lat right (2 of 4)]
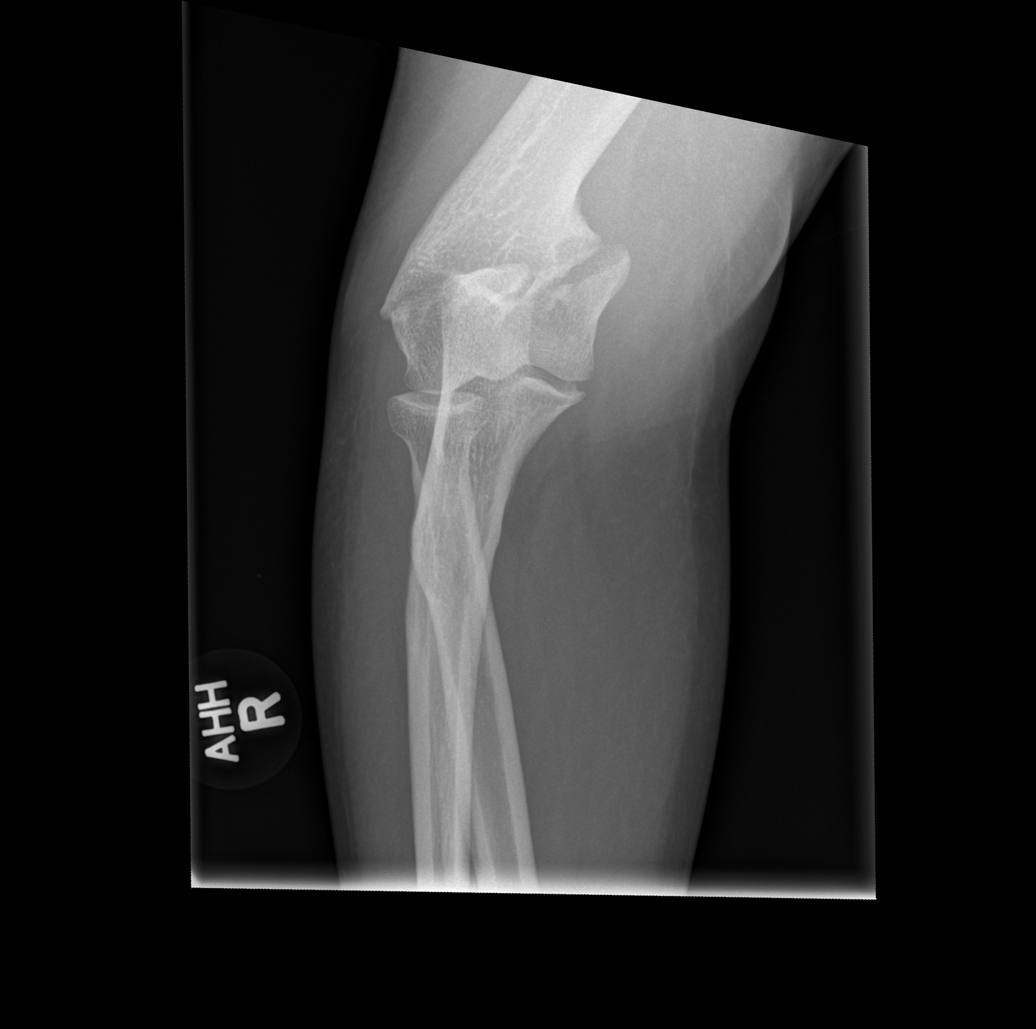

[x elbow lat right (3 of 4)]
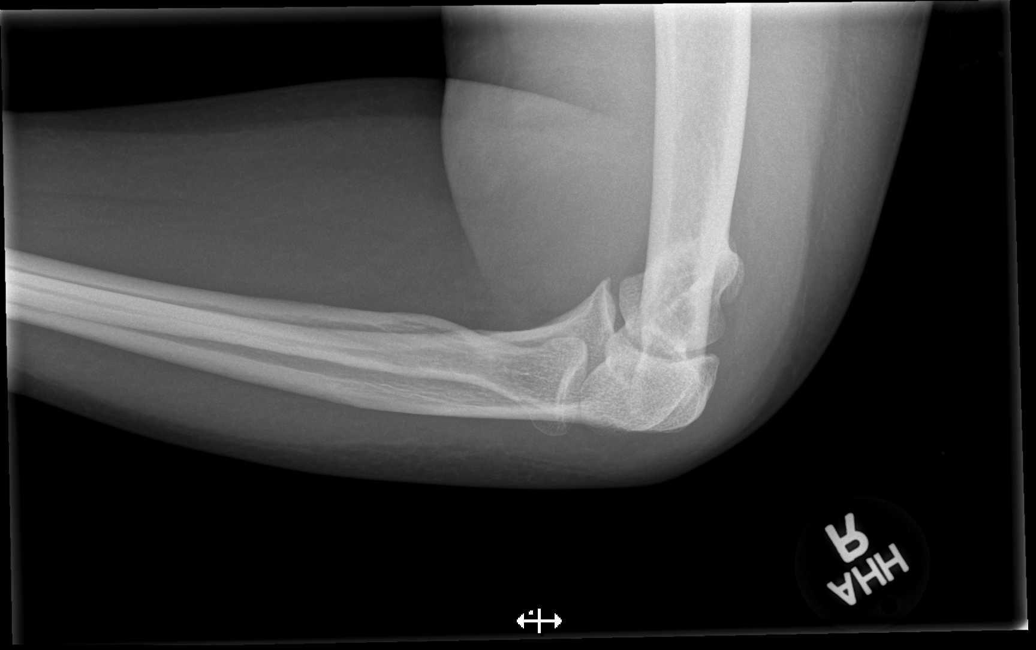

[x elbow lat right (4 of 4)]
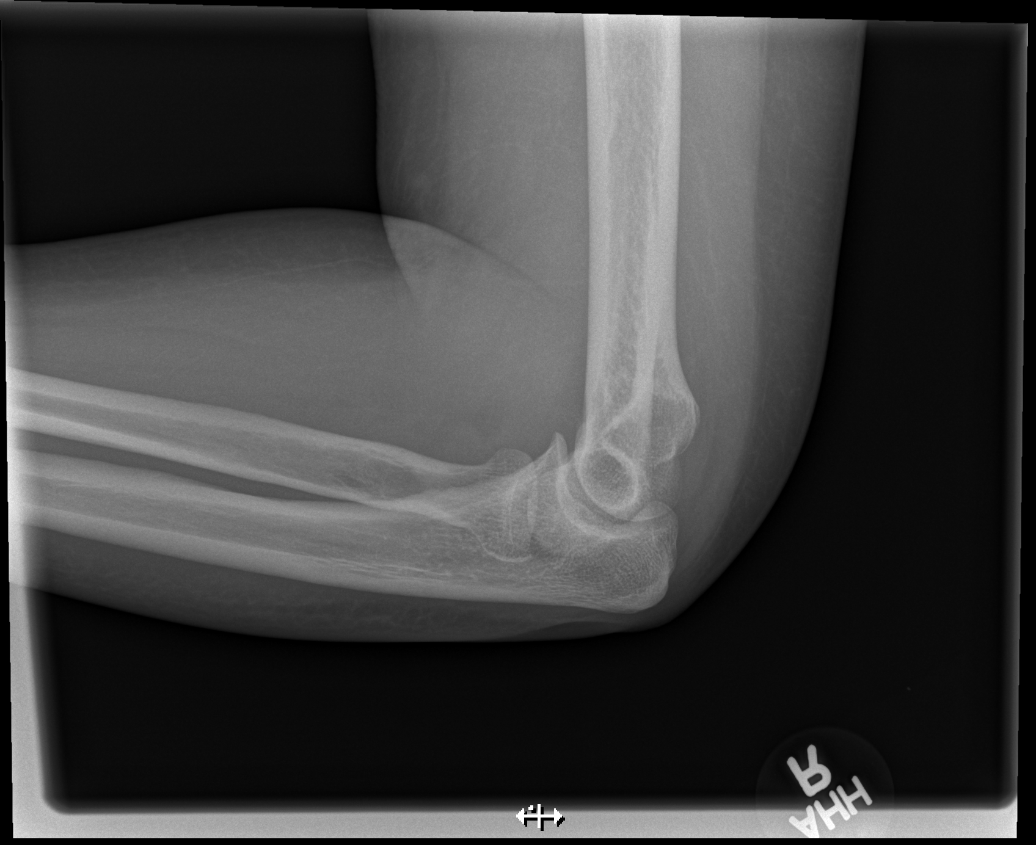

[5 of 5 positions shown; findings below may reference images not displayed]

FINDINGS: There is a minimally displaced transcondylar right distal humeral
fracture. There is no dislocation. There is a small joint effusion.
The soft tissues are normal.
IMPRESSION: Minimally displaced transcondylar right distal humeral fracture.

## 2015-06-02 IMAGING — CR DG CHEST 2V
2 series · 2 of 2 positions shown · non-contrast
Comparison: Prior radiograph from 02/02/2014

CLINICAL DATA: shortness of breath

EXAM:
CHEST  2 VIEW

[x chest ap]
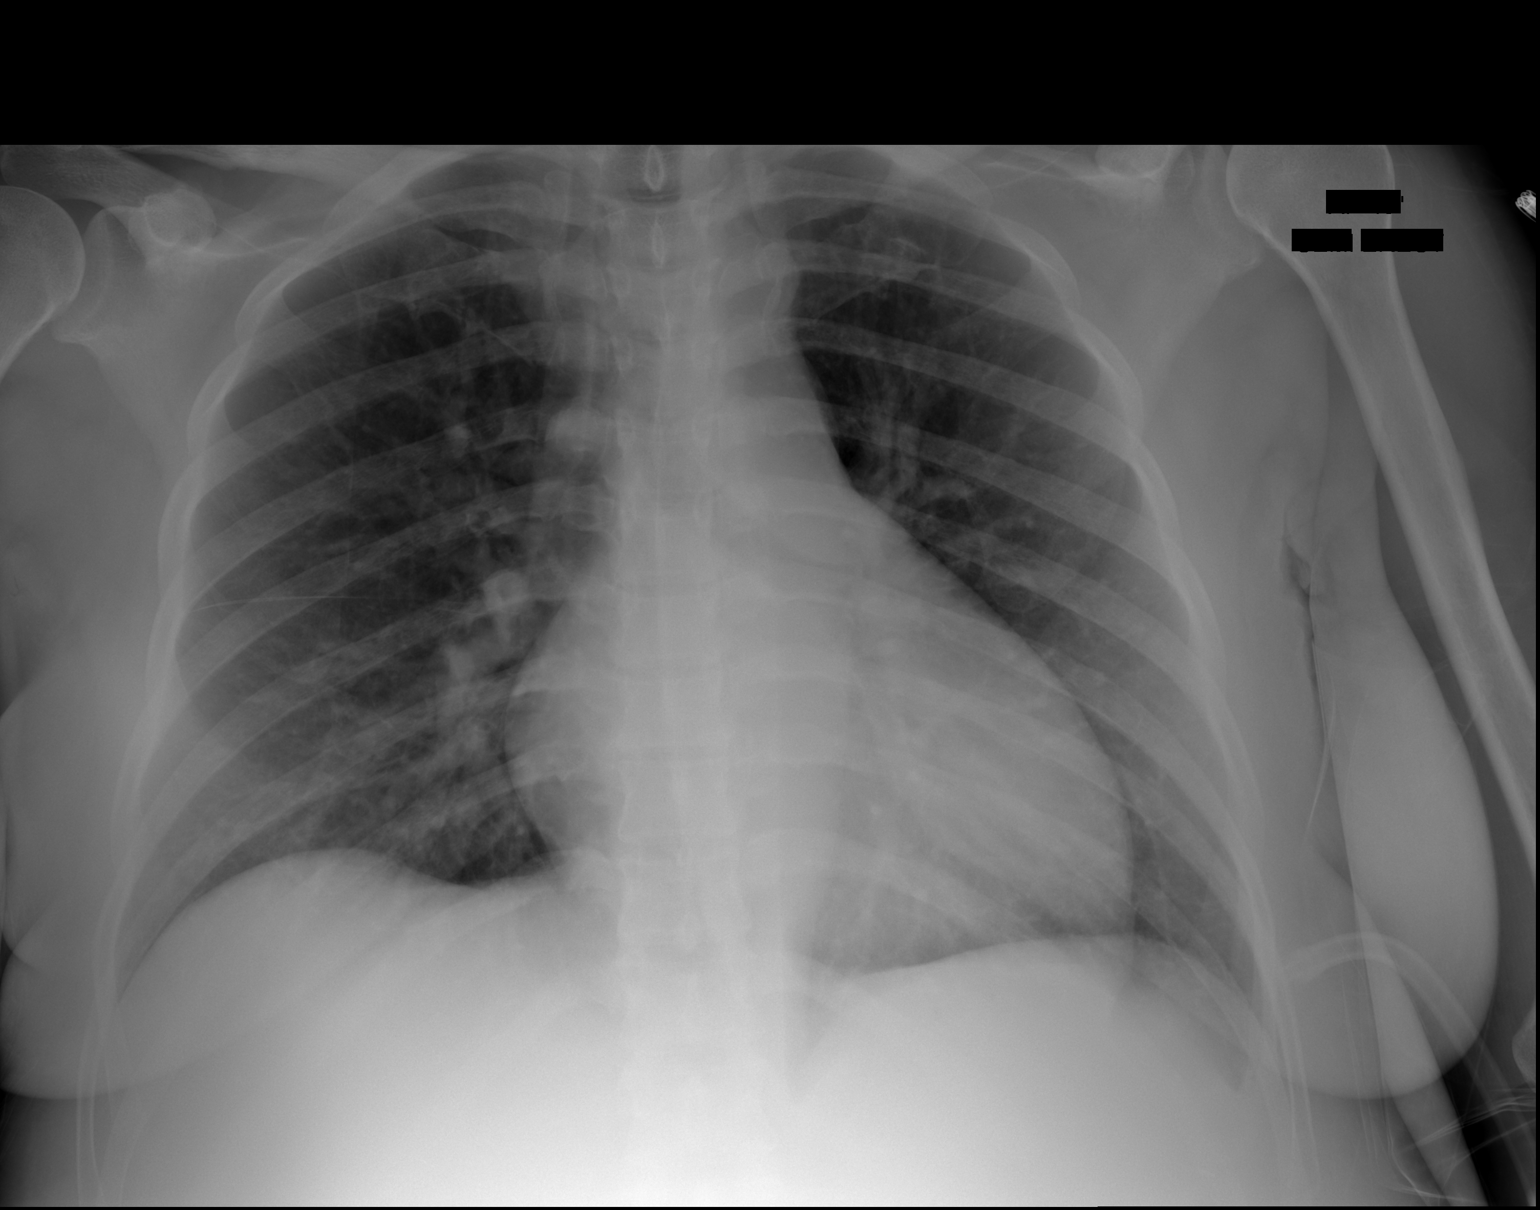

[w chest lat]
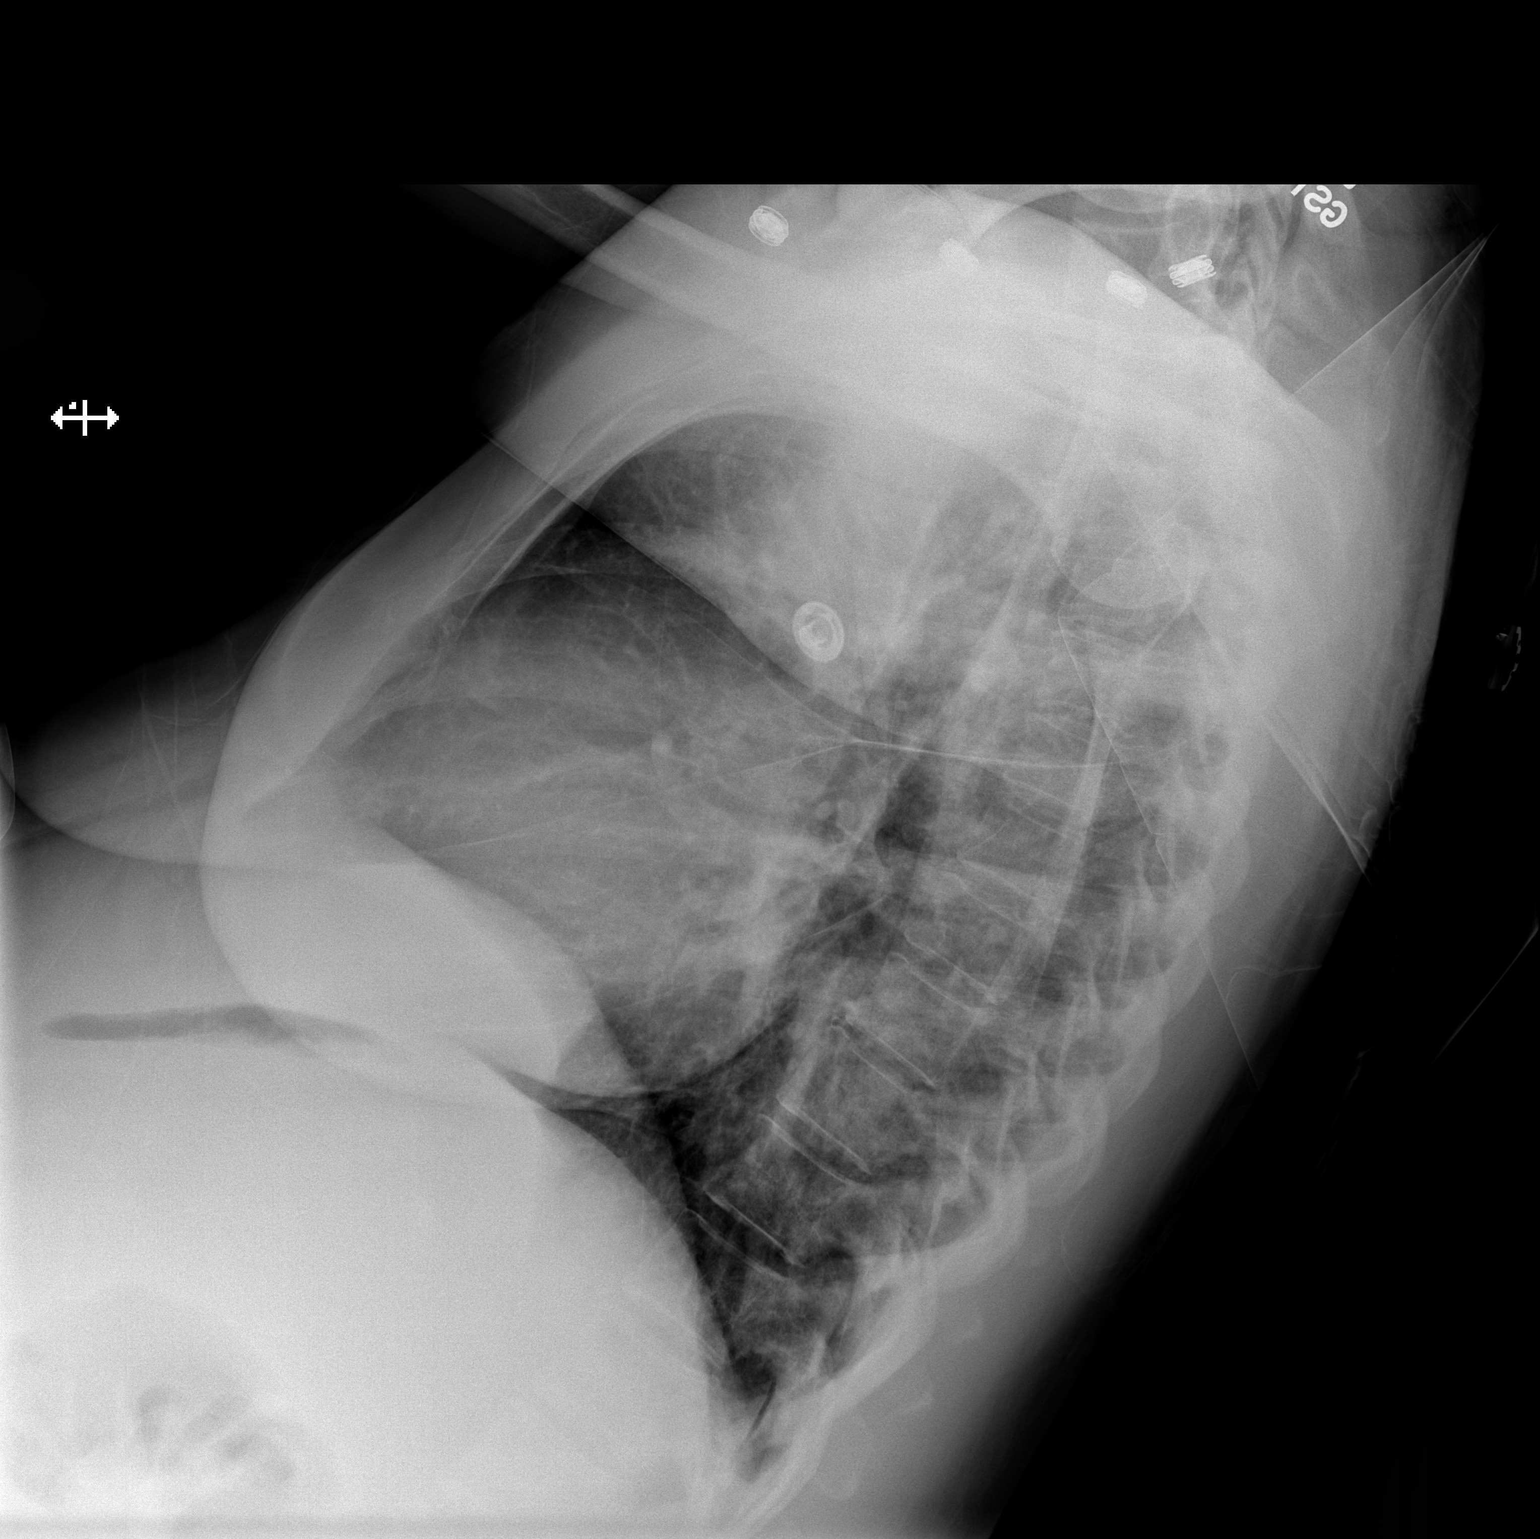

[2 of 2 positions shown; findings below may reference images not displayed]

FINDINGS: The cardiac and mediastinal silhouettes are stable in size and
contour, and remain within normal limits.

The lungs are normally inflated. Scattered opacities within the
medial right lung base on frontal projection are favored to reflect
the normal bronchovascular markings. No airspace consolidation,
pleural effusion, or pulmonary edema is identified. There is no
pneumothorax.

No acute osseous abnormality identified.
IMPRESSION: No active cardiopulmonary disease.

## 2015-06-02 IMAGING — CR DG WRIST COMPLETE 3+V*R*
4 series · 4 of 4 positions shown · non-contrast
Comparison: None.

CLINICAL DATA: Right wrist pain.

EXAM:
RIGHT WRIST - COMPLETE 3+ VIEW

[x wrist pa right]
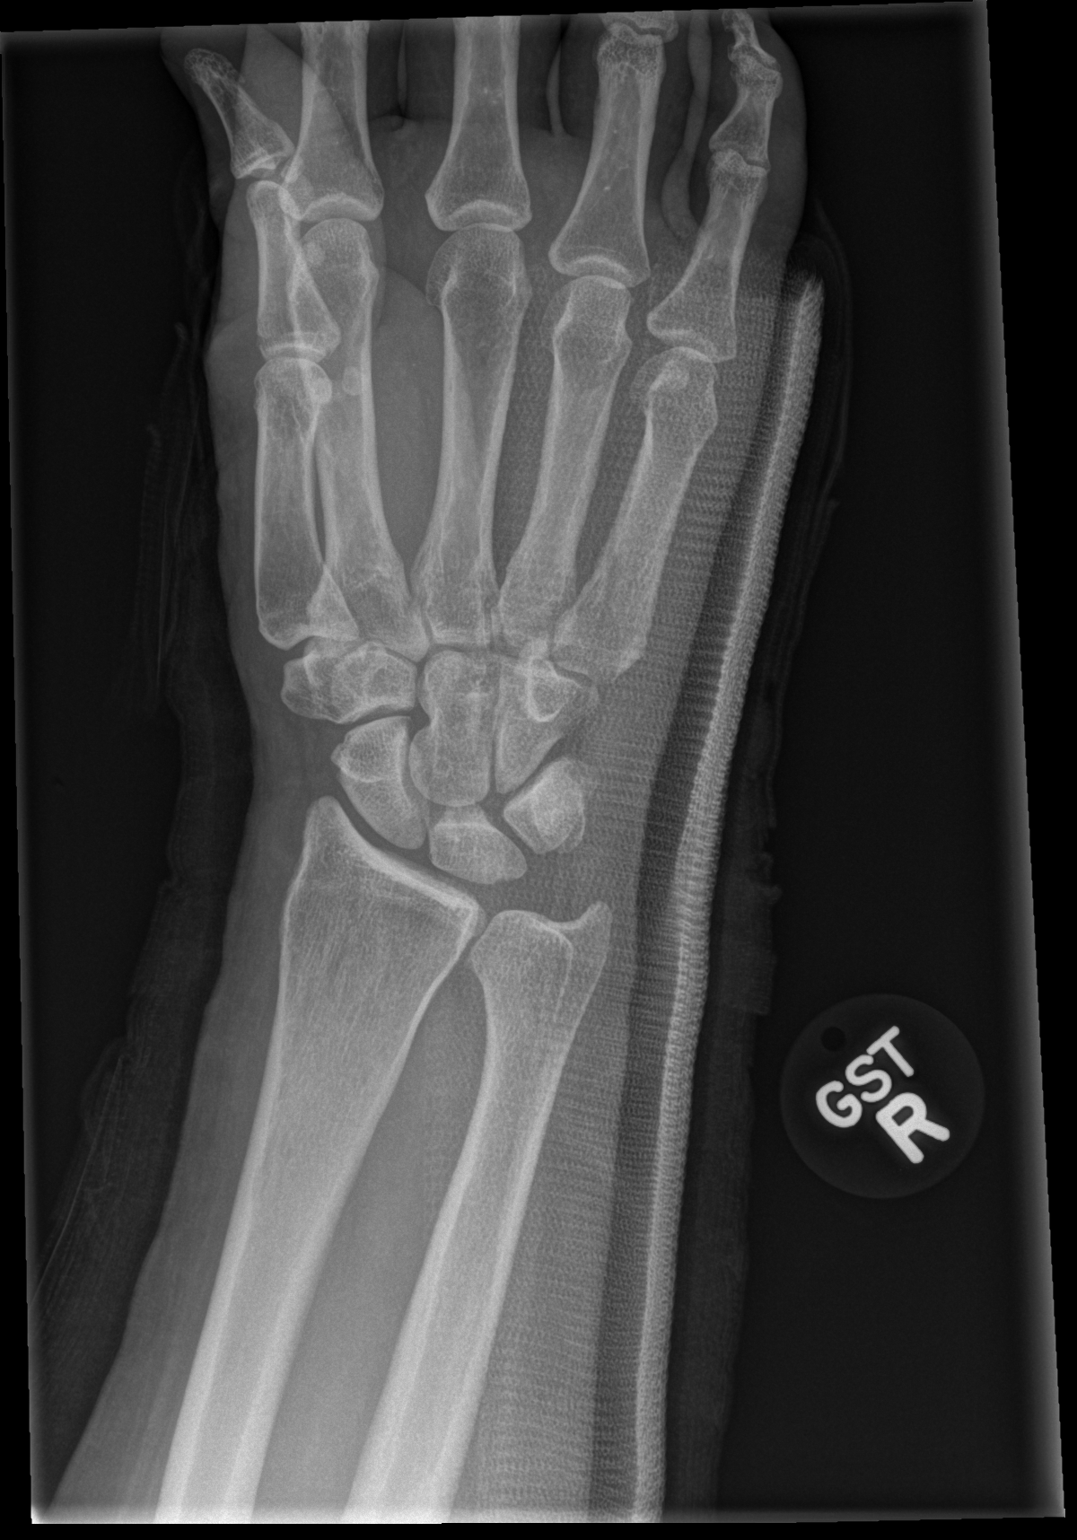

[x wrist obl right]
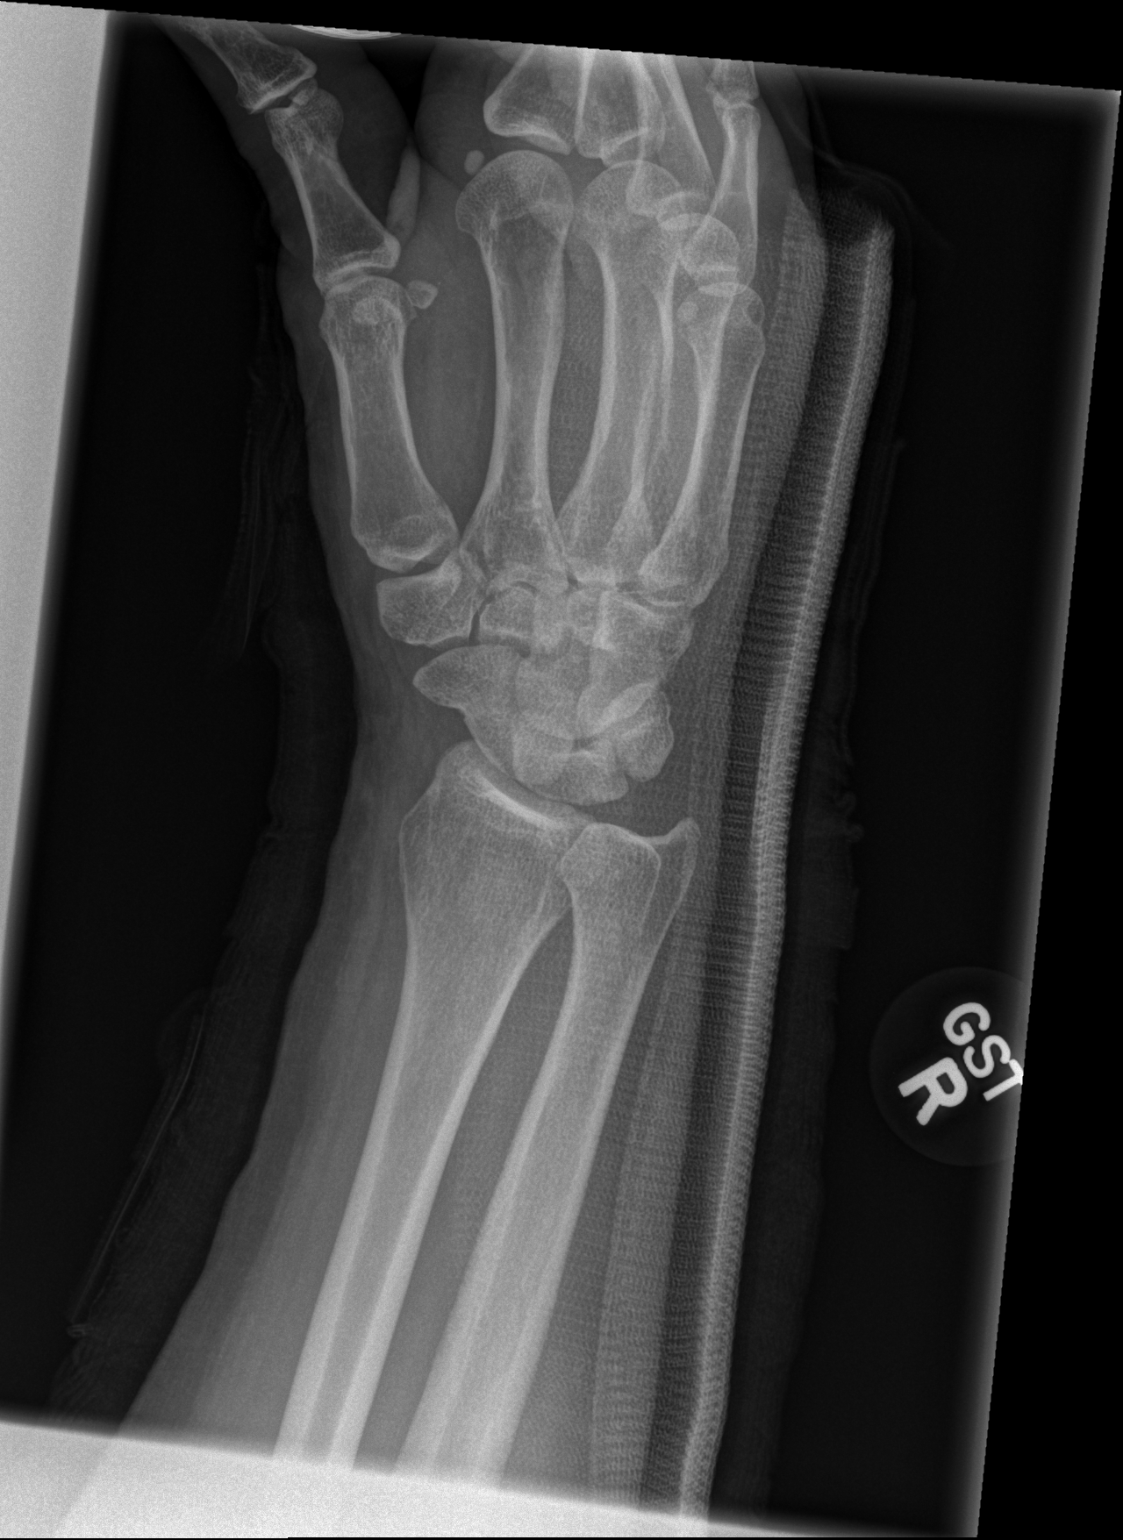

[x wrist navicular view right]
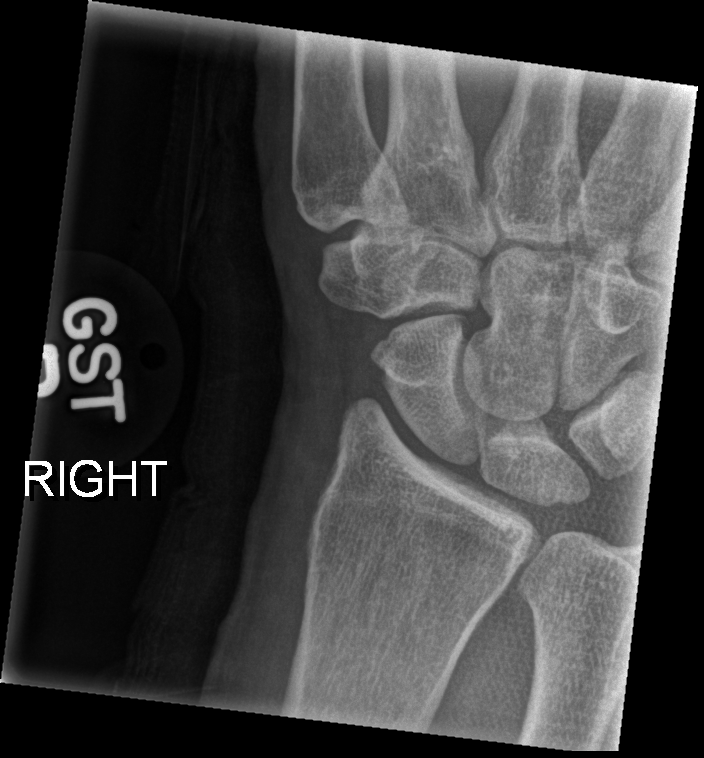

[x wrist lat right]
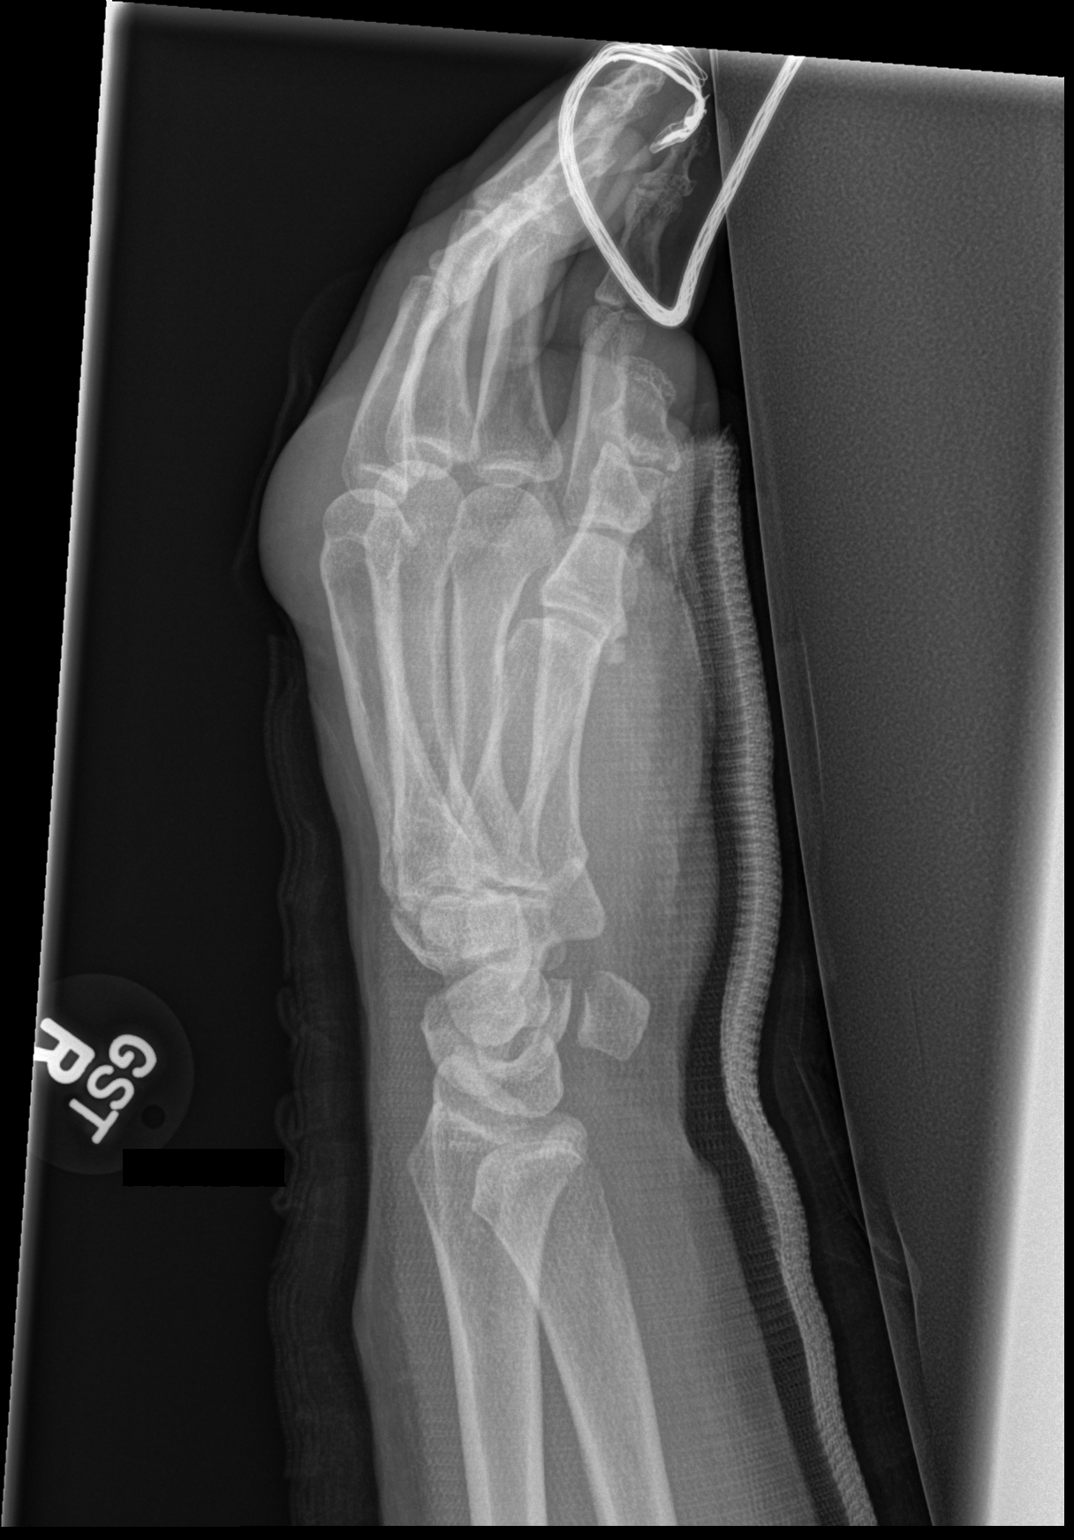

[4 of 4 positions shown; findings below may reference images not displayed]

FINDINGS: There is no evidence of fracture or dislocation. The carpal rows are
intact, and demonstrate normal alignment. The joint spaces are
preserved.

Evaluation of the soft tissues is mildly suboptimal due to an
overlying splint.
IMPRESSION: No evidence of fracture or dislocation.

## 2015-06-02 IMAGING — CR DG SHOULDER 2+V*R*
2 series · 2 of 2 positions shown · non-contrast
Comparison: None.

CLINICAL DATA: Shoulder pain.  No acute injury.

EXAM:
RIGHT SHOULDER - 2+ VIEW

[x shoulder ap right]
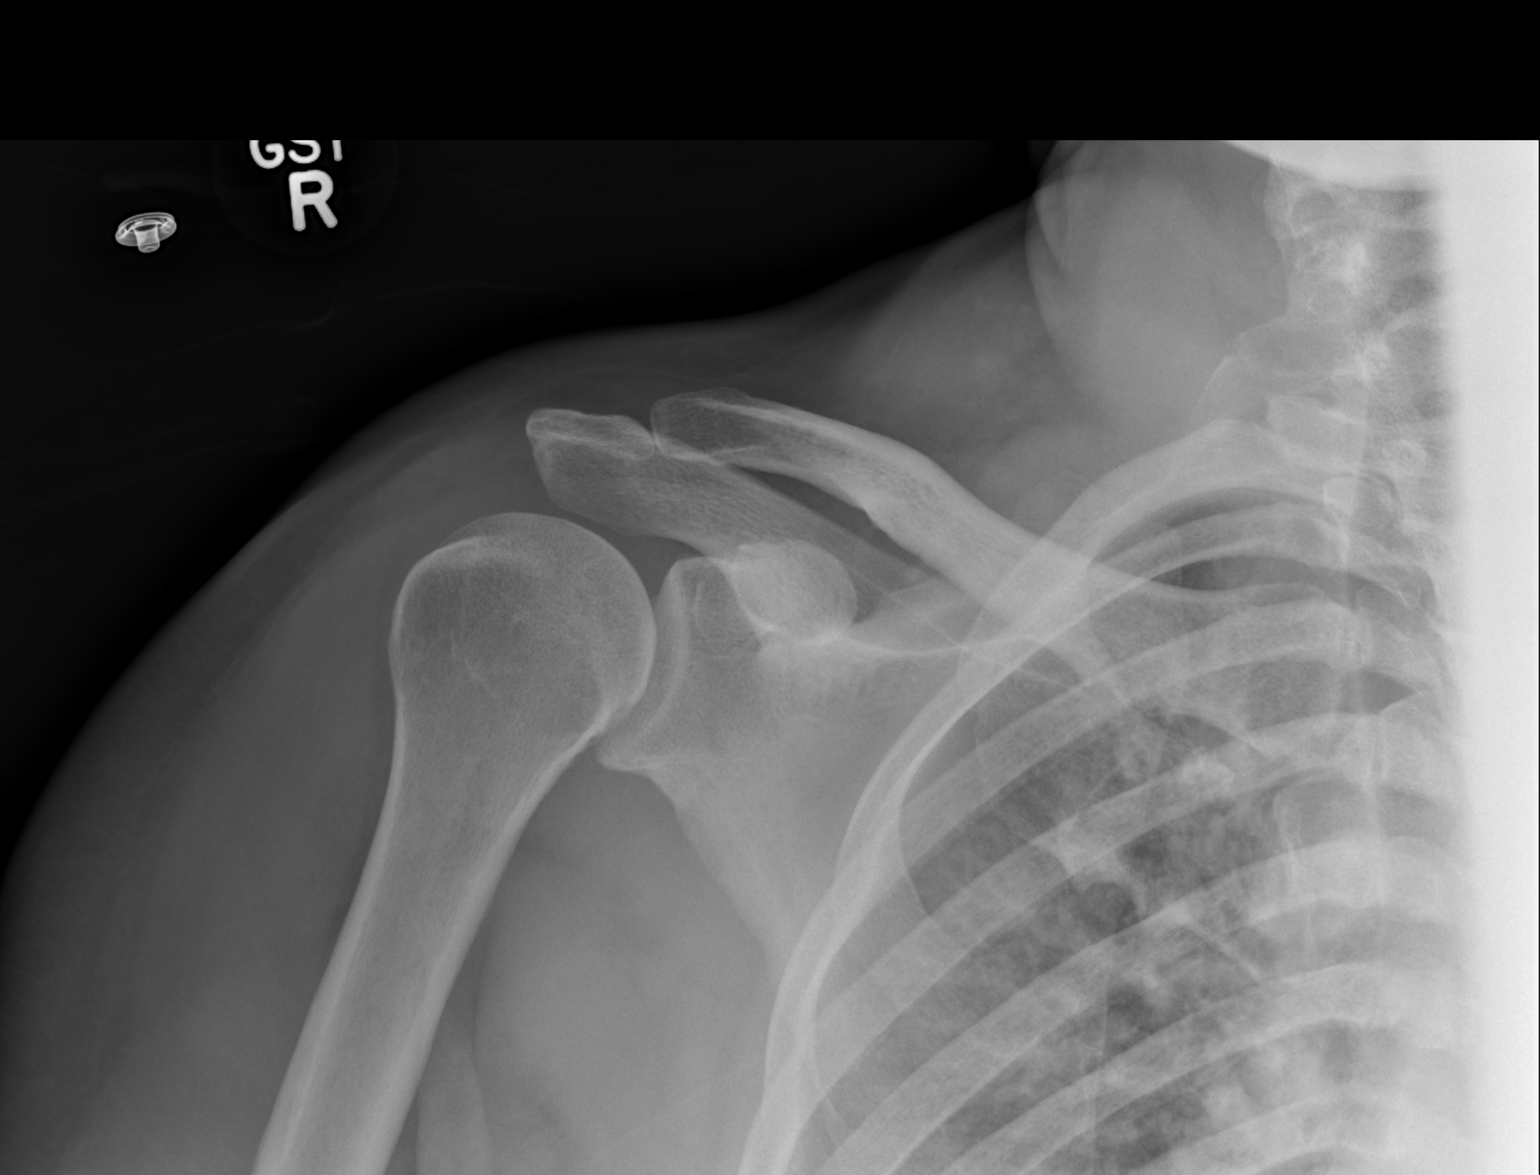

[x scapula y-view right]
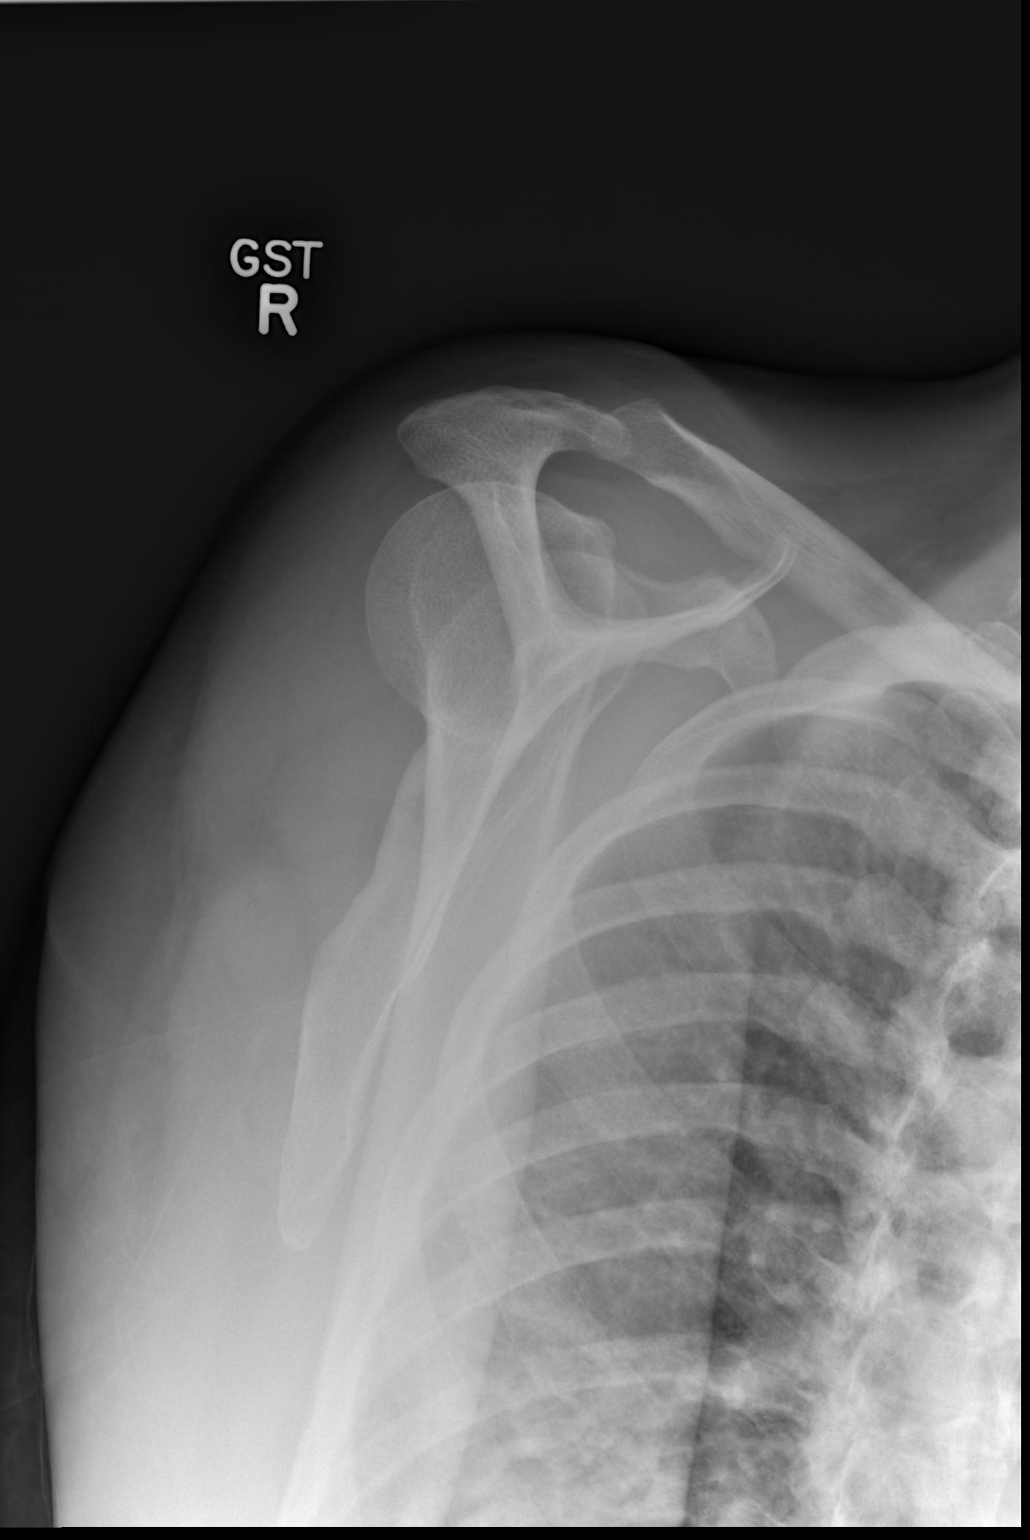

[2 of 2 positions shown; findings below may reference images not displayed]

FINDINGS: The mineralization and alignment are normal. There is no evidence of
acute fracture or dislocation. The subacromial space is preserved.
IMPRESSION: No acute osseous findings.

## 2015-06-03 IMAGING — CR DG CHEST 2V
2 series · 2 of 2 positions shown · non-contrast
Comparison: Chest radiograph performed 02/21/2014

CLINICAL DATA: Leg swelling and shortness of breath.

EXAM:
CHEST  2 VIEW

[x chest ap]
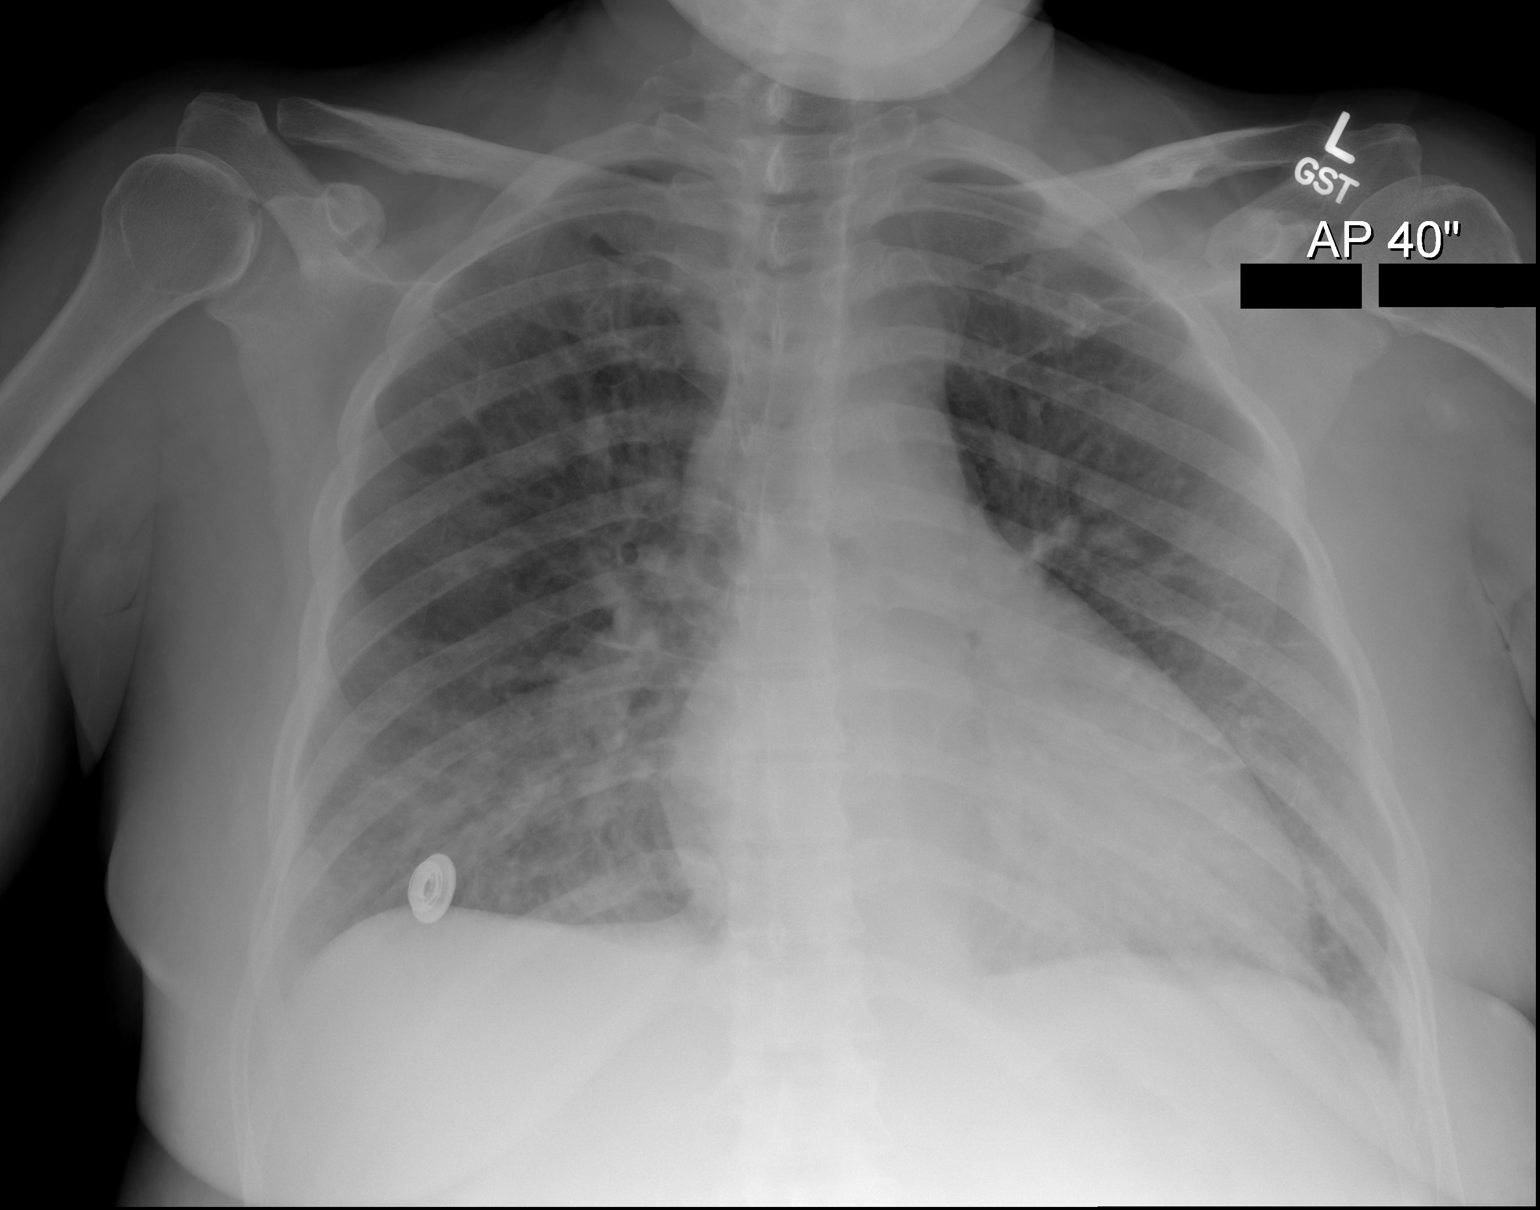

[w chest lat]
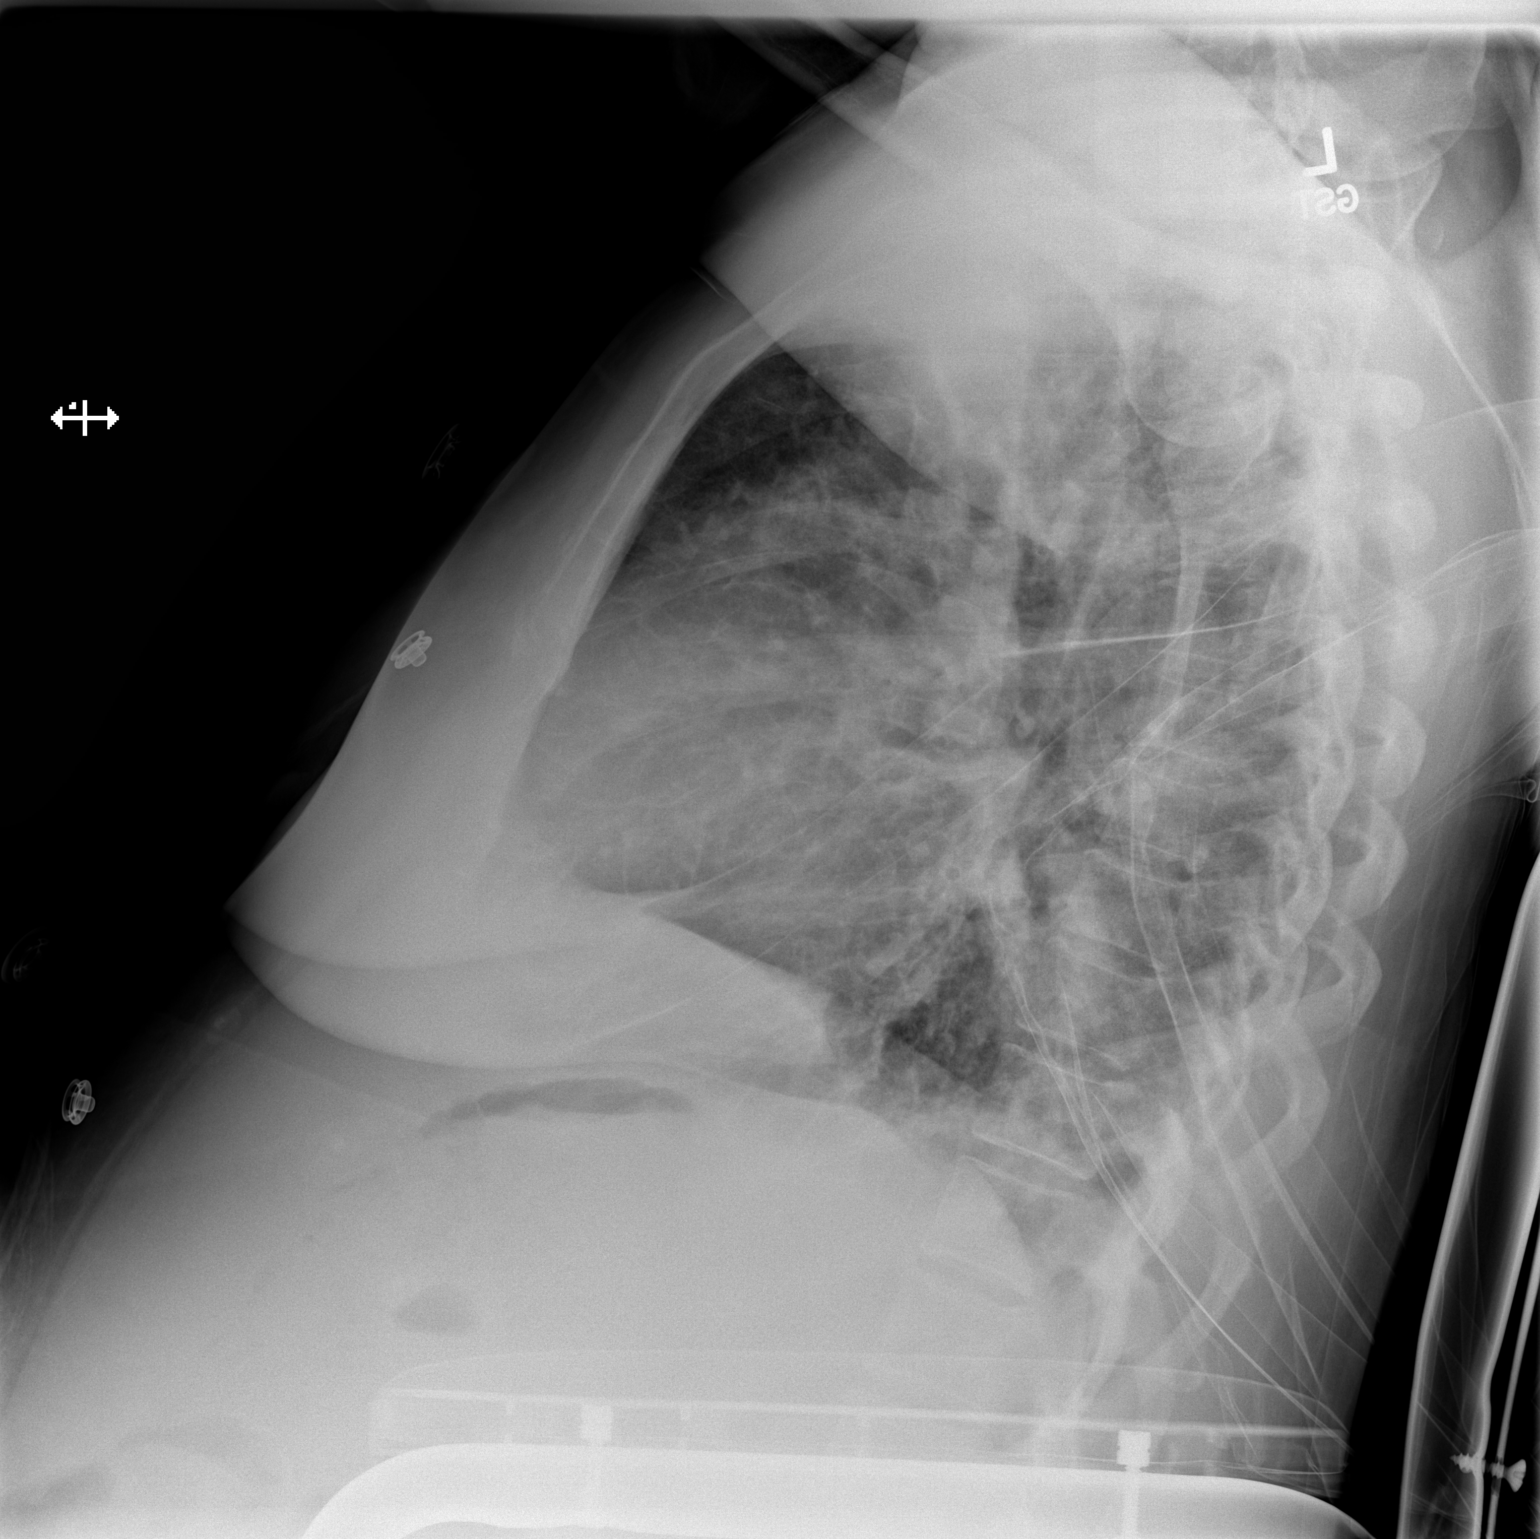

[2 of 2 positions shown; findings below may reference images not displayed]

FINDINGS: The lungs are well-aerated. Vascular congestion is noted, with
mildly increased interstitial markings, raising concern for mild
pulmonary edema. No pleural effusion or pneumothorax is seen.

The heart is borderline normal in size; the mediastinal contour is
within normal limits. No acute osseous abnormalities are seen.
IMPRESSION: Vascular congestion, with mildly increased interstitial markings,
raising concern for mild interstitial edema.

## 2015-06-05 NOTE — Patient Outreach (Signed)
[facility]) Care Management  06/05/2015  [name] August 02, 1966 [id number]   Case Closure:  Referral for transition of care: Attempted x 3 to reach patient without success.  Sent letter without a response.  Plan: Will close case as unable to reach. Will notify MD.  Tomasa Rand, RN, BSN, CEN [place] Coordinator [phone]

## 2015-06-12 NOTE — Patient Outreach (Signed)
[facility]) Care Management  06/12/2015  ADINE MACEK 1966/03/01 [id number]   Notification from Tomasa Rand, RN to close case due to unable to contact patient for [place] Management services.  Thanks, Ronnell Freshwater. [place], [place] Assistant Phone: [phone] Fax: [phone]

## 2015-06-16 IMAGING — CR DG ELBOW COMPLETE 3+V*R*
4 series · 4 of 4 positions shown · non-contrast
Comparison: 02/15/2014

CLINICAL DATA: Worsening pain.  Recent fracture.

EXAM:
RIGHT ELBOW - COMPLETE 3+ VIEW

[x elbow obl right (1 of 3)]
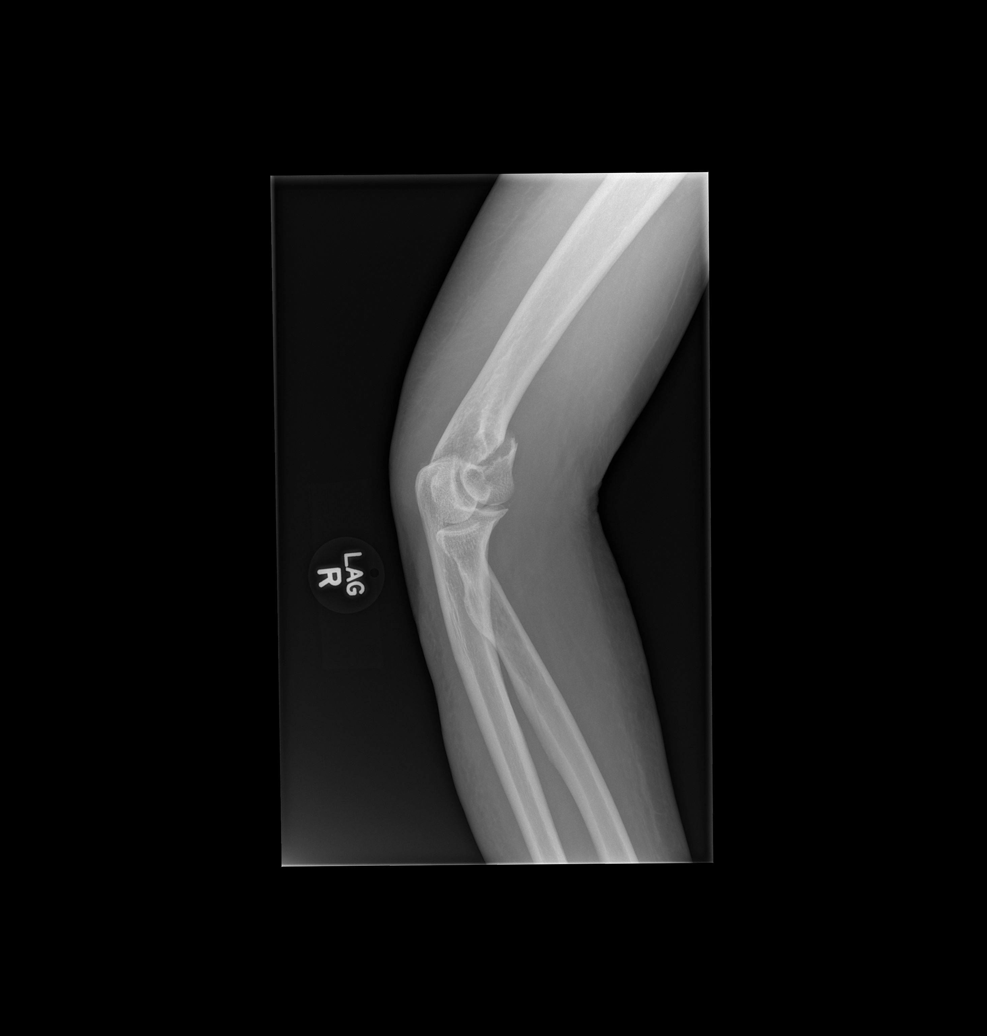

[x elbow obl right (2 of 3)]
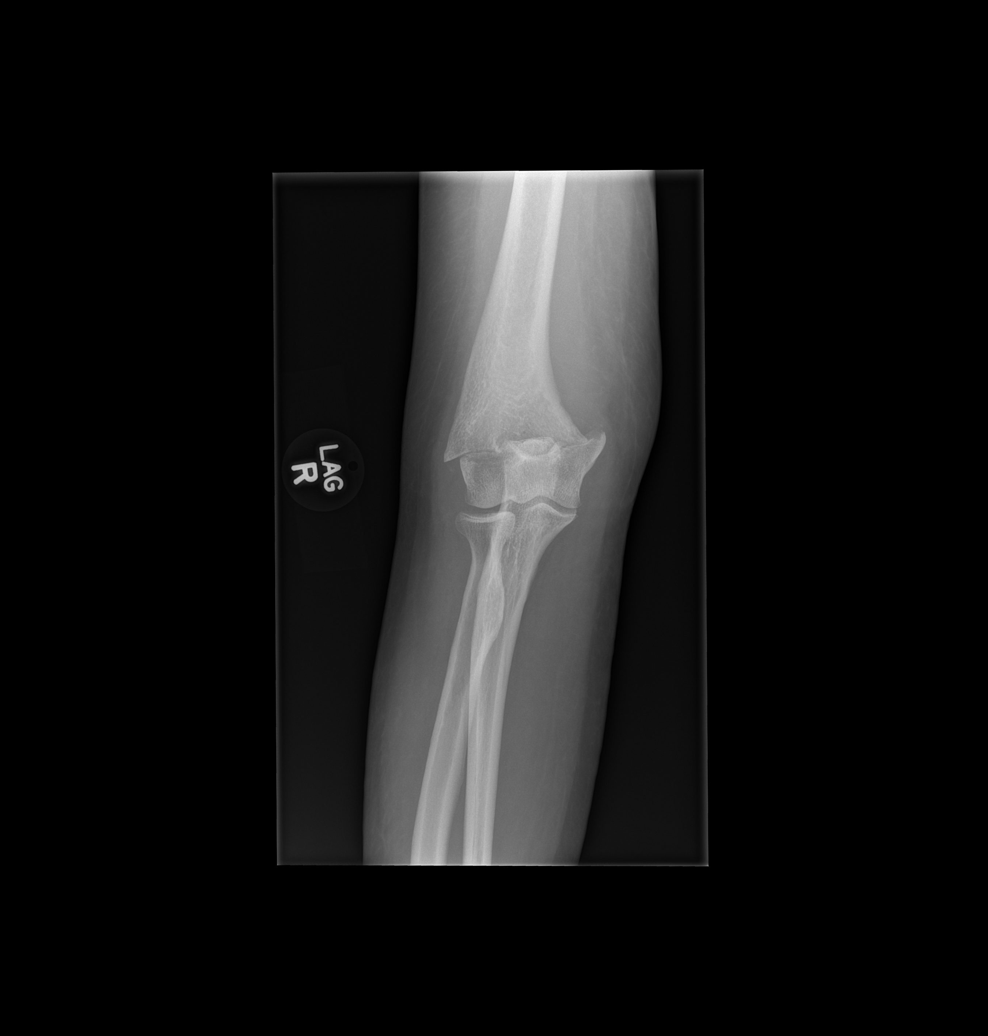

[x elbow obl right (3 of 3)]
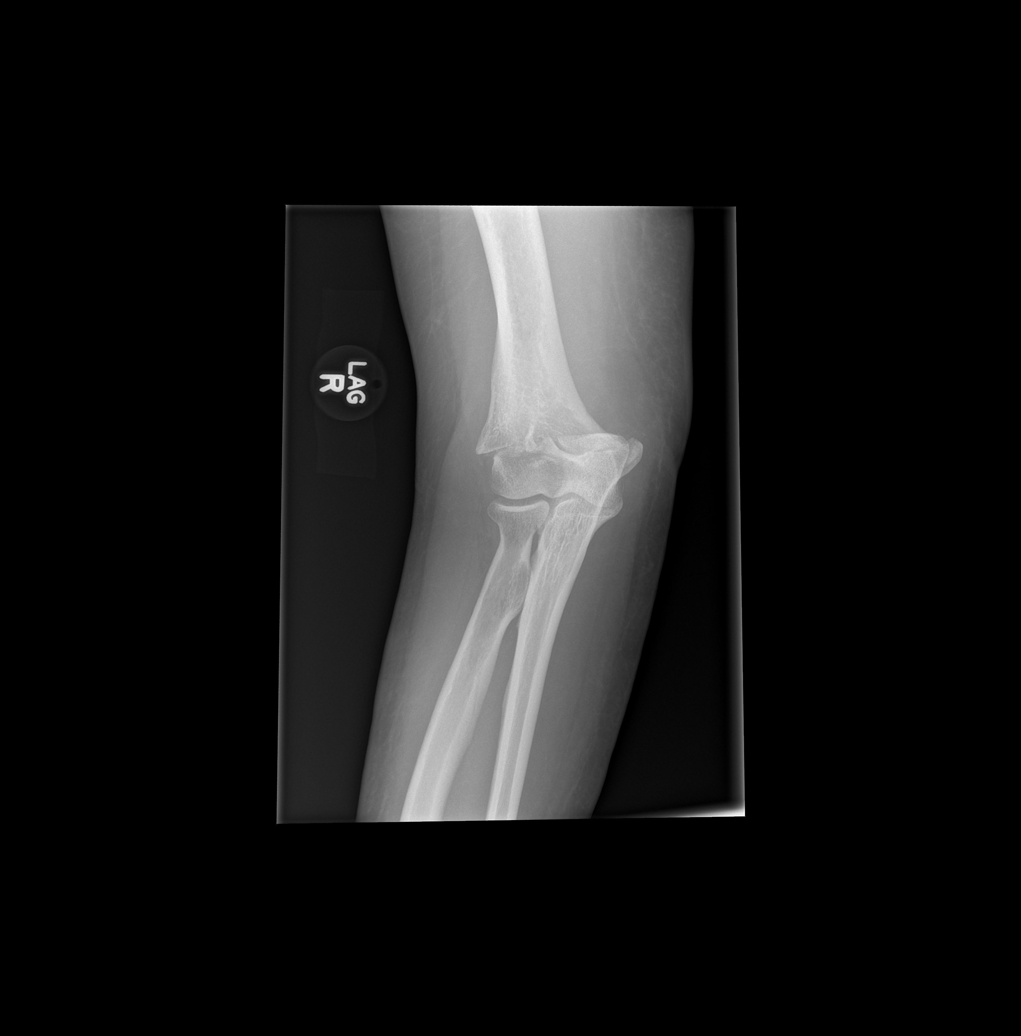

[x elbow lat right]
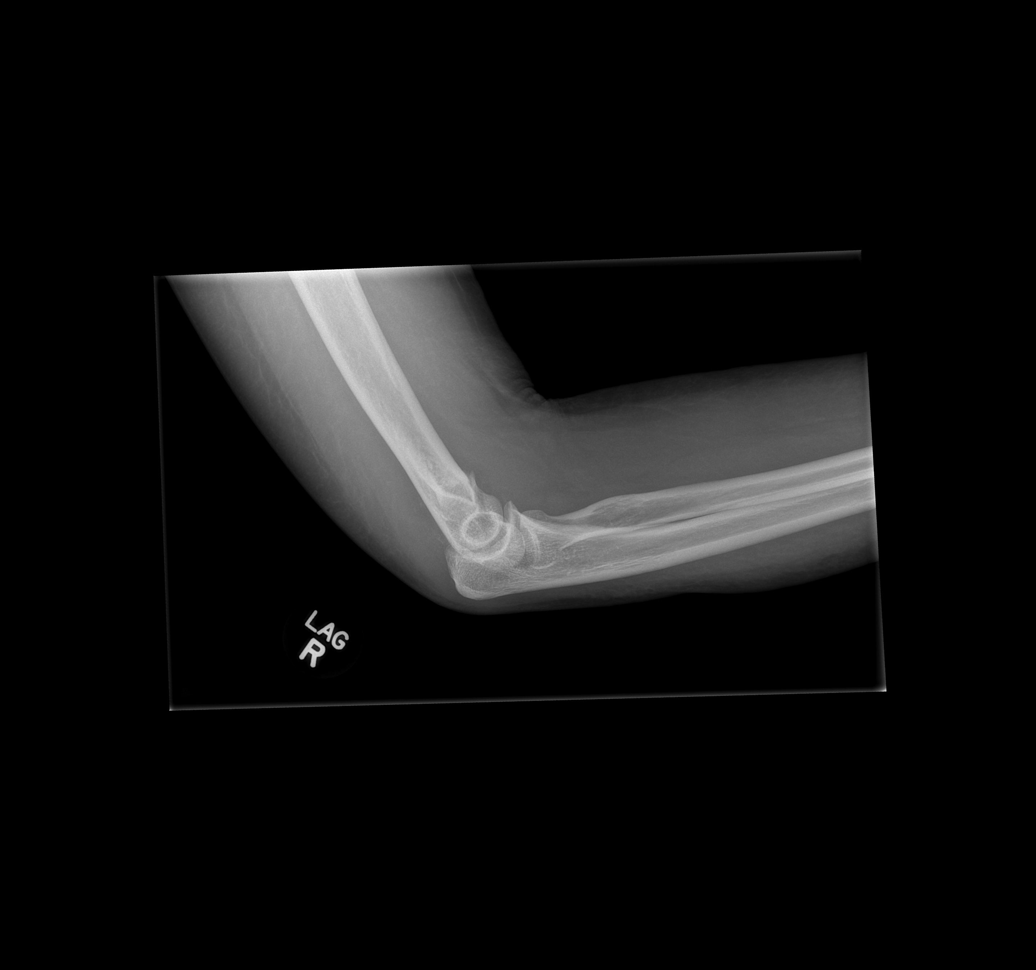

[4 of 4 positions shown; findings below may reference images not displayed]

FINDINGS: There is a transverse fracture through the supracondylar humerus. No
definitive changes of osseous healing. There is medial displacement
which is increased from prior, now up to 7 mm in the frontal
projection. On going surrounding soft tissue swelling and joint
effusion.
IMPRESSION: Subacute supracondylar humerus fracture. Medial displacement is
increased from 02/15/2014.

## 2015-06-19 ENCOUNTER — Ambulatory Visit: Payer: Medicare Other | Attending: Pain Medicine | Admitting: Pain Medicine

## 2015-06-19 ENCOUNTER — Encounter: Payer: Self-pay | Admitting: Pain Medicine

## 2015-06-19 ENCOUNTER — Other Ambulatory Visit: Payer: Self-pay | Admitting: Pain Medicine

## 2015-06-19 VITALS — BP 194/85 | HR 88 | Temp 98.5°F | Resp 18 | Ht 64.0 in | Wt 174.0 lb

## 2015-06-19 DIAGNOSIS — I120 Hypertensive chronic kidney disease with stage 5 chronic kidney disease or end stage renal disease: Secondary | ICD-10-CM | POA: Insufficient documentation

## 2015-06-19 DIAGNOSIS — Z992 Dependence on renal dialysis: Secondary | ICD-10-CM | POA: Insufficient documentation

## 2015-06-19 DIAGNOSIS — F319 Bipolar disorder, unspecified: Secondary | ICD-10-CM | POA: Diagnosis not present

## 2015-06-19 DIAGNOSIS — E559 Vitamin D deficiency, unspecified: Secondary | ICD-10-CM | POA: Insufficient documentation

## 2015-06-19 DIAGNOSIS — F1190 Opioid use, unspecified, uncomplicated: Secondary | ICD-10-CM | POA: Diagnosis not present

## 2015-06-19 DIAGNOSIS — Z79891 Long term (current) use of opiate analgesic: Secondary | ICD-10-CM | POA: Insufficient documentation

## 2015-06-19 DIAGNOSIS — M79673 Pain in unspecified foot: Secondary | ICD-10-CM | POA: Diagnosis present

## 2015-06-19 DIAGNOSIS — Z87891 Personal history of nicotine dependence: Secondary | ICD-10-CM | POA: Diagnosis not present

## 2015-06-19 DIAGNOSIS — G40909 Epilepsy, unspecified, not intractable, without status epilepticus: Secondary | ICD-10-CM | POA: Diagnosis not present

## 2015-06-19 DIAGNOSIS — Z8639 Personal history of other endocrine, nutritional and metabolic disease: Secondary | ICD-10-CM

## 2015-06-19 DIAGNOSIS — G894 Chronic pain syndrome: Secondary | ICD-10-CM | POA: Insufficient documentation

## 2015-06-19 DIAGNOSIS — M25521 Pain in right elbow: Secondary | ICD-10-CM | POA: Insufficient documentation

## 2015-06-19 DIAGNOSIS — M79672 Pain in left foot: Secondary | ICD-10-CM

## 2015-06-19 DIAGNOSIS — G8929 Other chronic pain: Secondary | ICD-10-CM | POA: Insufficient documentation

## 2015-06-19 DIAGNOSIS — B182 Chronic viral hepatitis C: Secondary | ICD-10-CM | POA: Diagnosis not present

## 2015-06-19 DIAGNOSIS — Z8673 Personal history of transient ischemic attack (TIA), and cerebral infarction without residual deficits: Secondary | ICD-10-CM | POA: Diagnosis not present

## 2015-06-19 DIAGNOSIS — Z79899 Other long term (current) drug therapy: Secondary | ICD-10-CM

## 2015-06-19 DIAGNOSIS — R079 Chest pain, unspecified: Secondary | ICD-10-CM | POA: Diagnosis present

## 2015-06-19 DIAGNOSIS — F1410 Cocaine abuse, uncomplicated: Secondary | ICD-10-CM | POA: Diagnosis not present

## 2015-06-19 DIAGNOSIS — E119 Type 2 diabetes mellitus without complications: Secondary | ICD-10-CM | POA: Insufficient documentation

## 2015-06-19 DIAGNOSIS — F259 Schizoaffective disorder, unspecified: Secondary | ICD-10-CM | POA: Diagnosis not present

## 2015-06-19 DIAGNOSIS — F1120 Opioid dependence, uncomplicated: Secondary | ICD-10-CM

## 2015-06-19 DIAGNOSIS — F1990 Other psychoactive substance use, unspecified, uncomplicated: Secondary | ICD-10-CM

## 2015-06-19 MED ORDER — OXYCODONE HCL 10 MG PO TABS: 10.0000 mg | ORAL_TABLET | Freq: Four times a day (QID) | ORAL | Status: DC | PRN

## 2015-06-19 NOTE — Progress Notes (Signed)
Patient's Name: [name] MRN: [id number] DOB: April 05, 1966 DOS: 06/19/2015  Primary Reason(s) for Visit: Encounter for Medication Management. CC: Arm Pain; Foot Pain; Chest Pain; and Joint Pain   HPI:   [name] is a 49 y.o. year old, female patient, who returns today as an established patient. She has Seizure disorder ([place]); Hep C w/o coma, chronic (HCC); CKD (chronic kidney disease) stage 5, GFR less than 15 ml/min ([place]); Noncompliance; Bipolar disorder (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 ([place]); Nontraumatic amputation of left foot ([place]); Closed fracture of part of upper end of humerus; Acute renal failure ([place]); Metabolic acidosis; Diabetes mellitus with nephropathy ([place]); Hypokalemia; RUQ abdominal pain; Hypertensive urgency; Acute hyponatremia; Leukopenia; Anemia of chronic kidney failure; Chronic renal failure, stage 5 ([place]); Dialysis patient [facility]); Pancreatitis; Left arm pain; Diabetes mellitus with stage 5 chronic kidney disease ([place]); Left forearm pain; ESRD (end stage renal disease) on dialysis ([place]); Chronic pain; Chronic pain syndrome; Long term current use of opiate analgesic; Cocaine abuse; Chronic right elbow pain; Chronic left foot pain; History of TIA (transient ischemic attack); History of stroke; History of vitamin D deficiency; Substance use disorder Risk: "Very High"; Long term prescription opiate use; Opiate use; and Opiate dependence, continuous ([place]) on her problem list.. Her primarily concern today is the Arm Pain; Foot Pain; Chest Pain; and Joint Pain    The patient's last urine drug screen test came back positive for cocaine. Again we asked her today if she had been taking any illegal drugs and she denied it. I was very clear today in to her  that the use of any type of illegal substances will not be tolerated and if today some UDS comes back positive, we will immediately discontinue the use of the controlled substances. This was the patient's last warning. Even though the patient describes her pain today as a 10 over 10, clinically she appears to be more like a 1-2/10. There is clear symptom exaggeration here. Today's Pain Score: 10-Worst pain ever Clear symptom exaggeration. Reported level of pain is incompatible with clinical observations. Pain Type: Chronic pain Pain Location:  (right arm, left foot, chest, joint pain) Pain Descriptors / Indicators:  (i have different pain in different spots.) Pain Frequency: Constant  Date of Last Visit: Date of Last Visit: 03/27/15 Service Provided on Last Visit: Service Provided on Last Visit: Med Refill  Pharmacotherapy Review:   Side-effects or Adverse reactions: None reported. Effectiveness: Described as relatively effective, allowing for increase in activities of daily living (ADL). Onset of action: Within expected pharmacological parameters. Duration of action: The patient complains that that medication is not strong enough and it doesn't last long enough. However, she has end-stage renal disease and therefore you would expect to complete opposite. Peak effect: Timing and results are as within normal expected parameters. [place] PMP: [place] PMP results show that the patient had a prescription filled on 04/25/2015 which I had written for her on her last visit on 03/25/2015 for oxycodone. Unfortunately, 12 days later the patient had another prescriptions failed for oxycodone written on 10-16 by another practitioner. Today when we asked the patient if she had obtained medications from anybody else as of last visit, she denied it. She also indicated that she had been occasionally taking 2 tablets at a time instead of 1 claiming that in the  past we had her on oxycodone 10 mg which upon  reviewing the PMP this is clearly not true. The last time the patient was on 10 mg of oxycodone the patient today prescription had been written by Dr. Mardelle Matte in February 2016. Last UDS available in the system:     Component Value Date/Time   LABOPIA POSITIVE* 05/10/2015 0250   COCAINSCRNUR POSITIVE* 05/10/2015 0250   LABBENZ NONE DETECTED 05/10/2015 0250   AMPHETMU NONE DETECTED 05/10/2015 0250   THCU NONE DETECTED 05/10/2015 0250   LABBARB NONE DETECTED 05/10/2015 0250    UDS: Abnormal results non-compliant with practice regulations. The patient's last UDS was positive for cocaine on 05/10/2015. Lab work: Lab work ordered today. No results available at this time. (Pending) Treatment compliance: Non-compliant. Steps taken to remind the patient of the seriousness of adequate therapy compliance. Substance Use Disorder (SUD) Risk Level: Very high risk with clear evidence of noncompliance. Planned course of action: Today we've provided the patient with a prescription for 10 mg of oxycodone to see if this is an issue of pseudo-addiction, or she simply noncompliant diabetes in the system. Today we obtained an other urine drug screening test and we asked the patient if she had been taking any illegal substances, which she denied. If I find that she was on truthful to me, she will be immediately discharged from our practice.  Allergies: [name] is allergic to gabapentin; gabapentin; lyrica; other; penicillins; saphris; saphris; tramadol; tramadol; and penicillins.  Meds: The patient has a current medication list which includes the following prescription(s): amlodipine, atorvastatin, bisacodyl, accu-chek aviva plus, carvedilol, diphenhydramine, ferrous sulfate, folic acid, glucose blood, insulin aspart, insulin glargine, lactulose, accu-chek softclix, levetiracetam, multivitamin with minerals, mupirocin cream, pantoprazole, polyethylene glycol, promethazine, and oxycodone hcl. Requested Prescriptions    Signed Prescriptions Disp Refills  . Oxycodone HCl 10 MG TABS 120 tablet 0    Sig: Take 1 tablet (10 mg total) by mouth every 6 (six) hours as needed.    ROS: Constitutional: Afebrile, no chills, well hydrated and well nourished Gastrointestinal: negative Musculoskeletal:negative Neurological: negative Behavioral/Psych: negative  PFSH: Medical:  [name]  has a past medical history of Neuropathy ([place]); Glaucoma; Bipolar 1 disorder ([place]); Refusal of blood transfusions as patient is Jehovah's Witness; TIA (transient ischemic attack) (2010); MRSA infection (?2014); Bipolar disorder, unspecified ([place]) (02/06/2014); High cholesterol; Type II diabetes mellitus ([place]); Hepatitis C; Chronic pain syndrome; ESRD on hemodialysis ([place]); Schizoaffective disorder, unspecified condition (02/08/2014); Seizure ([place]) (dx'd 2014); Chronic combined systolic and diastolic CHF (congestive heart failure) ([place]); Anemia; Hypertension; Depression; Anxiety; GERD (gastroesophageal reflux disease); History of hiatal hernia; Headache; and History of vitamin D deficiency (06/20/2015). Family: family history includes Hypertension in her father and mother. Surgical:  has past surgical history that includes Amputation (Left, 05/21/2013); Tubal ligation; Colonoscopy with propofol (N/A, 06/22/2013); Abdominal hysterectomy (2014); Tonsillectomy (1970's); Refractive surgery (Bilateral, 2013); Tubal ligation (2000); Abdominal hysterectomy (2010); Foot Amputation (Left); Eye surgery (Bilateral, 2010); AV fistula placement (Left, 05/04/2015); and Esophagogastroduodenoscopy (N/A, 05/11/2015). Tobacco:  reports that she quit smoking about 2 months ago. Her smoking use included Cigarettes. She has a 20 pack-year smoking history. She has never used smokeless tobacco. Alcohol:  reports that she does not drink alcohol. Drug:  reports that she does not use illicit drugs.  Physical Exam: Vitals:  Today's Vitals   06/19/15 0837 06/19/15 0838   BP:  194/85  Pulse:  88  Temp:  98.5 F (36.9 C)  Resp:  18  Height: 5\' 4"  (  1.626 m)   Weight: 174 lb (78.926 kg)   SpO2:  100%  PainSc: 10-Worst pain ever 10-Worst pain ever  Calculated BMI: Body mass index is 29.85 kg/(m^2). General appearance: alert, appears stated age, distracted, no distress and mildly obese Eyes: conjunctivae/corneas clear. PERRL, EOM's intact. Fundi benign. Lungs: No evidence respiratory distress, no audible rales or ronchi and no use of accessory muscles of respiration Neck: no adenopathy, no carotid bruit, no JVD, supple, symmetrical, trachea midline and thyroid not enlarged, symmetric, no tenderness/mass/nodules Back: symmetric, no curvature. ROM normal. No CVA tenderness. Extremities: Obvious deformity of her right humerus secondary to an old fracture. In addition, the patient presents with some swelling of her left lower extremity. Skin lesions arm evident in all 4 extremities. Its etiology is uncertain. Pulses: 2+ and symmetric Skin: Skin color, texture, turgor normal. No rashes or lesions Neurologic: Grossly normal    Assessment: Encounter Diagnosis:  Primary Diagnosis: Chronic pain [G89.29]  Plan: Cystal was seen today for arm pain, foot pain, chest pain and joint pain.  Diagnoses and all orders for this visit:  Chronic pain -     Oxycodone HCl 10 MG TABS; Take 1 tablet (10 mg total) by mouth every 6 (six) hours as needed. -     COMPLETE METABOLIC PANEL WITH GFR; Future -     C-reactive protein; Future -     Vitamin D2,D3 Panel; Future -     Sedimentation rate; Future  Chronic pain syndrome  Long term current use of opiate analgesic -     Drugs of abuse screen w/o alc, rtn urine-sln -     Drugs of abuse screen w/o alc, rtn urine-sln; Standing  Cocaine abuse  Chronic right elbow pain  Chronic left foot pain  History of TIA (transient ischemic attack)  History of stroke  History of vitamin D deficiency  Substance use disorder Risk:  "Very High"  Long term prescription opiate use  Opiate use  Opiate dependence, continuous (HCC)     There are no Patient Instructions on file for this visit. Medications discontinued today:  Medications Discontinued During This Encounter  Medication Reason  . oxyCODONE (OXY IR/ROXICODONE) 5 MG immediate release tablet Change in therapy   Medications administered today:  Ms. Rizzuto had no medications administered during this visit.  Primary Care Physician: [name], MD Location: [facility] Facility Note by: Kathlen Brunswick Dossie Arbour, M.D, DABA, DABAPM, DABPM, DABIPP, FIPP

## 2015-06-19 NOTE — Progress Notes (Signed)
Safety precautions to be maintained throughout the outpatient stay will include: orient to surroundings, keep bed in low position, maintain call [place] within reach at all times, provide assistance with transfer out of bed and ambulation. Patient states she has been out of Oxycodone x 1 week

## 2015-06-20 ENCOUNTER — Encounter: Payer: Self-pay | Admitting: Pain Medicine

## 2015-06-20 DIAGNOSIS — F1120 Opioid dependence, uncomplicated: Secondary | ICD-10-CM | POA: Insufficient documentation

## 2015-06-20 DIAGNOSIS — Z79891 Long term (current) use of opiate analgesic: Secondary | ICD-10-CM | POA: Insufficient documentation

## 2015-06-20 DIAGNOSIS — F1190 Opioid use, unspecified, uncomplicated: Secondary | ICD-10-CM | POA: Insufficient documentation

## 2015-06-20 DIAGNOSIS — Z8639 Personal history of other endocrine, nutritional and metabolic disease: Secondary | ICD-10-CM

## 2015-06-20 HISTORY — DX: Personal history of other endocrine, nutritional and metabolic disease: Z86.39

## 2015-06-28 LAB — TOXASSURE SELECT 13 (MW), URINE: PDF: 0

## 2015-07-04 ENCOUNTER — Encounter: Payer: Self-pay | Admitting: Pain Medicine

## 2015-07-04 NOTE — Progress Notes (Signed)
Quick Note:  The findings of this UDT are unacceptable and incompatible with appropriate, responsible use of controlled substances. This represents non-compliance with the safe use of controlled substances. We will no longer offer this therapy as a treatment option for this patient. ______

## 2015-07-06 ENCOUNTER — Encounter (HOSPITAL_COMMUNITY): Payer: Self-pay | Admitting: *Deleted

## 2015-07-06 ENCOUNTER — Emergency Department (HOSPITAL_COMMUNITY): Payer: Medicare Other

## 2015-07-06 ENCOUNTER — Emergency Department (HOSPITAL_COMMUNITY)
Admission: EM | Admit: 2015-07-06 | Discharge: 2015-07-06 | Disposition: A | Discharge status: Home / Self Care | Payer: Medicare Other | Attending: Emergency Medicine | Admitting: Emergency Medicine

## 2015-07-06 DIAGNOSIS — G894 Chronic pain syndrome: Secondary | ICD-10-CM | POA: Insufficient documentation

## 2015-07-06 DIAGNOSIS — Z992 Dependence on renal dialysis: Secondary | ICD-10-CM | POA: Diagnosis not present

## 2015-07-06 DIAGNOSIS — K8020 Calculus of gallbladder without cholecystitis without obstruction: Secondary | ICD-10-CM | POA: Insufficient documentation

## 2015-07-06 DIAGNOSIS — E119 Type 2 diabetes mellitus without complications: Secondary | ICD-10-CM | POA: Insufficient documentation

## 2015-07-06 DIAGNOSIS — I5042 Chronic combined systolic (congestive) and diastolic (congestive) heart failure: Secondary | ICD-10-CM | POA: Diagnosis not present

## 2015-07-06 DIAGNOSIS — Z79899 Other long term (current) drug therapy: Secondary | ICD-10-CM | POA: Insufficient documentation

## 2015-07-06 DIAGNOSIS — Z8619 Personal history of other infectious and parasitic diseases: Secondary | ICD-10-CM | POA: Diagnosis not present

## 2015-07-06 DIAGNOSIS — Z880 Allergy status to penicillin: Secondary | ICD-10-CM | POA: Insufficient documentation

## 2015-07-06 DIAGNOSIS — Z8614 Personal history of Methicillin resistant Staphylococcus aureus infection: Secondary | ICD-10-CM | POA: Diagnosis not present

## 2015-07-06 DIAGNOSIS — Z792 Long term (current) use of antibiotics: Secondary | ICD-10-CM | POA: Diagnosis not present

## 2015-07-06 DIAGNOSIS — Z794 Long term (current) use of insulin: Secondary | ICD-10-CM | POA: Diagnosis not present

## 2015-07-06 DIAGNOSIS — K219 Gastro-esophageal reflux disease without esophagitis: Secondary | ICD-10-CM | POA: Insufficient documentation

## 2015-07-06 DIAGNOSIS — I120 Hypertensive chronic kidney disease with stage 5 chronic kidney disease or end stage renal disease: Secondary | ICD-10-CM | POA: Diagnosis not present

## 2015-07-06 DIAGNOSIS — R030 Elevated blood-pressure reading, without diagnosis of hypertension: Secondary | ICD-10-CM

## 2015-07-06 DIAGNOSIS — Z87891 Personal history of nicotine dependence: Secondary | ICD-10-CM | POA: Diagnosis not present

## 2015-07-06 DIAGNOSIS — Z8659 Personal history of other mental and behavioral disorders: Secondary | ICD-10-CM | POA: Insufficient documentation

## 2015-07-06 DIAGNOSIS — D649 Anemia, unspecified: Secondary | ICD-10-CM | POA: Diagnosis not present

## 2015-07-06 DIAGNOSIS — R109 Unspecified abdominal pain: Secondary | ICD-10-CM | POA: Diagnosis present

## 2015-07-06 DIAGNOSIS — Z8673 Personal history of transient ischemic attack (TIA), and cerebral infarction without residual deficits: Secondary | ICD-10-CM | POA: Insufficient documentation

## 2015-07-06 DIAGNOSIS — E7800 Pure hypercholesterolemia, unspecified: Secondary | ICD-10-CM | POA: Diagnosis not present

## 2015-07-06 DIAGNOSIS — R000 Tachycardia, unspecified: Secondary | ICD-10-CM | POA: Diagnosis not present

## 2015-07-06 DIAGNOSIS — IMO0001 Reserved for inherently not codable concepts without codable children: Secondary | ICD-10-CM

## 2015-07-06 DIAGNOSIS — N186 End stage renal disease: Secondary | ICD-10-CM | POA: Insufficient documentation

## 2015-07-06 LAB — COMPREHENSIVE METABOLIC PANEL
ALK PHOS: 73 U/L (ref 38–126)
ALT: 34 U/L (ref 14–54)
ANION GAP: 11 (ref 5–15)
AST: 44 U/L — ABNORMAL HIGH (ref 15–41)
Albumin: 2.6 g/dL — ABNORMAL LOW (ref 3.5–5.0)
BILIRUBIN TOTAL: 0.9 mg/dL (ref 0.3–1.2)
BUN: 38 mg/dL — ABNORMAL HIGH (ref 6–20)
CALCIUM: 9 mg/dL (ref 8.9–10.3)
CO2: 30 mmol/L (ref 22–32)
Chloride: 95 mmol/L — ABNORMAL LOW (ref 101–111)
Creatinine, Ser: 6.39 mg/dL — ABNORMAL HIGH (ref 0.44–1.00)
GFR calc Af Amer: 8 mL/min — ABNORMAL LOW (ref >60)
GFR, EST NON AFRICAN AMERICAN: 7 mL/min — AB (ref >60)
GLUCOSE: 152 mg/dL — AB (ref 65–99)
POTASSIUM: 3.4 mmol/L — AB (ref 3.5–5.1)
Sodium: 136 mmol/L (ref 135–145)
TOTAL PROTEIN: 6.5 g/dL (ref 6.5–8.1)

## 2015-07-06 LAB — LIPASE, BLOOD: Lipase: 42 U/L (ref 11–51)

## 2015-07-06 LAB — URINALYSIS, ROUTINE W REFLEX MICROSCOPIC
BILIRUBIN URINE: NEGATIVE
Glucose, UA: 250 mg/dL — AB
Ketones, ur: 15 mg/dL — AB
Leukocytes, UA: NEGATIVE
NITRITE: NEGATIVE
PH: 7.5 (ref 5.0–8.0)
Protein, ur: >300 mg/dL — AB
SPECIFIC GRAVITY, URINE: 1.016 (ref 1.005–1.030)

## 2015-07-06 LAB — URINE MICROSCOPIC-ADD ON

## 2015-07-06 LAB — CBC
HEMATOCRIT: 32.3 % — AB (ref 36.0–46.0)
HEMOGLOBIN: 10.1 g/dL — AB (ref 12.0–15.0)
MCH: 29.3 pg (ref 26.0–34.0)
MCHC: 31.3 g/dL (ref 30.0–36.0)
MCV: 93.6 fL (ref 78.0–100.0)
Platelets: 171 10*3/uL (ref 150–400)
RBC: 3.45 MIL/uL — ABNORMAL LOW (ref 3.87–5.11)
RDW: 16.1 % — AB (ref 11.5–15.5)
WBC: 3.4 10*3/uL — AB (ref 4.0–10.5)

## 2015-07-06 LAB — CBG MONITORING, ED: Glucose-Capillary: 133 mg/dL — ABNORMAL HIGH (ref 65–99)

## 2015-07-06 MED ORDER — PANTOPRAZOLE SODIUM 40 MG IV SOLR
40.0000 mg | Freq: Once | INTRAVENOUS | Status: AC
  Administered 2015-07-06: 40 mg via INTRAVENOUS
  Filled 2015-07-06: qty 40

## 2015-07-06 MED ORDER — HYDRALAZINE HCL 20 MG/ML IJ SOLN
10.0000 mg | Freq: Once | INTRAMUSCULAR | Status: AC
  Administered 2015-07-06: 10 mg via INTRAVENOUS
  Filled 2015-07-06: qty 1

## 2015-07-06 MED ORDER — PROMETHAZINE HCL 12.5 MG PO TABS: 12.5000 mg | ORAL_TABLET | Freq: Four times a day (QID) | ORAL | Status: DC | PRN

## 2015-07-06 MED ORDER — ONDANSETRON HCL 4 MG/2ML IJ SOLN
4.0000 mg | Freq: Once | INTRAMUSCULAR | Status: AC
  Administered 2015-07-06: 4 mg via INTRAVENOUS
  Filled 2015-07-06: qty 2

## 2015-07-06 MED ORDER — MORPHINE SULFATE (PF) 4 MG/ML IV SOLN
4.0000 mg | Freq: Once | INTRAVENOUS | Status: AC
  Administered 2015-07-06: 4 mg via INTRAVENOUS
  Filled 2015-07-06: qty 1

## 2015-07-06 NOTE — Discharge Instructions (Signed)
Medications: Phenergan as needed for nausea, continue usual home medications. Please take your blood pressure medication as prescribed.  Follow Up: Please call and make appointment with the surgery clinic listed above for discussion about today's diagnosis and for further evaluation and management. Please follow up with your primary doctor in 3 days as wellt; Please return to the ER for any new or worsening symptoms, any additional concerns.

## 2015-07-06 NOTE — Progress Notes (Signed)
CSW provided pt with taxi voucher transport home.

## 2015-07-06 NOTE — ED Notes (Signed)
Per EMS- Pt has had abdominal pain with N/V. Pt reports needing her gallbladder removed. Pt is a dialysis pt last treatment was Monday.

## 2015-07-06 NOTE — ED Provider Notes (Signed)
CSN: [id number]     Arrival date & time 07/06/15  1041 History   First MD Initiated Contact with Patient 07/06/15 1043     Chief Complaint  Patient presents with  . Abdominal Pain  . Nausea     (Consider location/radiation/quality/duration/timing/severity/associated sxs/prior Treatment) Patient is a 49 y.o. female presenting with abdominal pain. The history is provided by the patient and medical records. No language interpreter was used.  Abdominal Pain Associated symptoms: nausea and vomiting   Associated symptoms: no constipation, no cough, no diarrhea, no dysuria, no shortness of breath and no sore throat    [name] is a 49 y.o. female  with a PMH of ESRD, DM2, Bipolar d/o, seizures who presents to the Emergency Department complaining of acute onset of persistent n/v since yesterday am. Pt. Also complaining of abdominal pain described as a burning in central abdomen. Pain radiates toward RUQ. Pt. Tried to take BP meds yesterday and today but vomited immediately after.  Last dialysis was Monday - regular schedule is MWF, however she missed yesterday 2/2 not feeling well.    Past Medical History  Diagnosis Date  . Neuropathy ([place])   . Glaucoma   . Bipolar 1 disorder ([place])   . Refusal of blood transfusions as patient is Jehovah's Witness   . TIA (transient ischemic attack) 2010  . MRSA infection ?2014    "on the back of my head; spread throughout my bloodstream"  . Bipolar disorder, unspecified ([place]) 02/06/2014  . High cholesterol   . 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 Hepatitis C   . Chronic pain syndrome   . ESRD on hemodialysis ([place])   . Schizoaffective disorder, unspecified condition 02/08/2014  . Seizure [facility]) dx'd 2014    "don't know what kind; last one was ~ 04/2014"  . Chronic combined systolic and diastolic CHF (congestive heart failure) (HCC)     EF 40% by echo and 48% by stress test  . Anemia   . Hypertension   . Depression   . Anxiety   .  GERD (gastroesophageal reflux disease)   . History of hiatal hernia   . Headache     sinus headaches  . History of vitamin D deficiency 06/20/2015   Past Surgical History  Procedure Laterality Date  . Amputation Left 05/21/2013    Procedure: Left Midfoot AMPUTATION;  Surgeon: Newt Minion, MD;  Location: [facility];  Service: Orthopedics;  Laterality: Left;  Left Midfoot Amputation  . Tubal ligation    . Colonoscopy with propofol N/A 06/22/2013    Procedure: COLONOSCOPY WITH PROPOFOL;  Surgeon: Jeryl Columbia, MD;  Location: [facility] ENDOSCOPY;  Service: Endoscopy;  Laterality: N/A;  . Abdominal hysterectomy  2014  . Tonsillectomy  1970's  . Refractive surgery Bilateral 2013  . Tubal ligation  2000  . Abdominal hysterectomy  2010  . Foot amputation Left     due diabetic neuropathy, could not feel ulcer on bottom of foot  . Eye surgery Bilateral 2010    Lasik  . Av fistula placement Left 05/04/2015    Procedure: ARTERIOVENOUS (AV) GRAFT INSERTION;  Surgeon: Angelia Mould, MD;  Location: [facility];  Service: Vascular;  Laterality: Left;  . Esophagogastroduodenoscopy N/A 05/11/2015    Procedure: ESOPHAGOGASTRODUODENOSCOPY (EGD);  Surgeon: Laurence Spates, MD;  Location[facility] ENDOSCOPY;  Service: Endoscopy;  Laterality: N/A;   Family History  Problem Relation Age of Onset  . Hypertension Mother   .  Hypertension Father    Social History  Substance Use Topics  . Smoking status: Former Smoker -- 1.00 packs/day for 20 years    Types: Cigarettes    Quit date: 03/31/2015  . Smokeless tobacco: Never Used     Comment: "stopped smoking in ~ 2013; restarted last week; stopped again 06/17/2014"  . Alcohol Use: No     Comment: 05/01/15 patient denies alcohol use.   OB History    Gravida Para Term Preterm AB TAB SAB Ectopic Multiple Living   0 0 0 0 0 0 0 0       Review of Systems  Constitutional: Negative.   HENT: Negative for congestion, rhinorrhea and sore throat.   Eyes: Negative for visual  disturbance.  Respiratory: Negative for cough, shortness of breath and wheezing.   Cardiovascular: Negative.   Gastrointestinal: Positive for nausea, vomiting and abdominal pain. Negative for diarrhea and constipation.  Genitourinary: Negative for dysuria, urgency and frequency.  Musculoskeletal: Positive for myalgias. Negative for back pain, arthralgias and neck pain.  Skin: Negative for rash.  Neurological: Negative for dizziness, weakness and headaches.      Allergies  Gabapentin; Gabapentin; Lyrica; Other; Penicillins; Saphris; Saphris; Tramadol; and Tramadol  Home Medications   Prior to Admission medications   Medication Sig Start Date End Date Taking? Authorizing Provider  amLODipine (NORVASC) 5 MG tablet Take 2 tablets (10 mg total) by mouth at bedtime. Patient taking differently: Take 5 mg by mouth at bedtime.  05/12/15  Yes Modena Jansky, MD  amphetamine-dextroamphetamine (ADDERALL XR) 30 MG 24 hr capsule Take 30 mg by mouth daily.   Yes Historical Provider, MD  atorvastatin (LIPITOR) 40 MG tablet Take 1 tablet (40 mg total) by mouth at bedtime. 05/07/15  Yes Ripudeep Krystal Eaton, MD  carvedilol (COREG) 25 MG tablet Take 1 tablet (25 mg total) by mouth 2 (two) times daily. 05/07/15  Yes Ripudeep Krystal Eaton, MD  diphenhydrAMINE (BENADRYL) 25 mg capsule Take 1 capsule (25 mg total) by mouth every 6 (six) hours as needed for itching. 05/07/15  Yes Ripudeep Krystal Eaton, MD  ferrous sulfate 325 (65 FE) MG tablet Take 1 tablet (325 mg total) by mouth daily with breakfast. 06/24/14  Yes Debbe Odea, MD  folic acid (FOLVITE) 834 MCG tablet Take 200 mcg by mouth every morning.    Yes Historical Provider, MD  insulin aspart (NOVOLOG) 100 UNIT/ML injection Inject 2-10 Units into the skin 3 (three) times daily before meals. SSI: 0-200 = 2 units, 201-250 = 4 units, 251-300 = 6 units, 301-350 = 8 units, 351-400 = 10 units 05/07/15  Yes Ripudeep K Rai, MD  insulin glargine (LANTUS) 100 UNIT/ML injection Inject  0.25 mLs (25 Units total) into the skin at bedtime. 05/07/15  Yes Ripudeep Krystal Eaton, MD  levETIRAcetam (KEPPRA) 500 MG tablet Take 500 mg by mouth every 12 (twelve) hours. 01/25/15  Yes Historical Provider, MD  Multiple Vitamin (MULTIVITAMIN WITH MINERALS) TABS tablet Take 1 tablet by mouth daily.   Yes Historical Provider, MD  mupirocin cream (BACTROBAN) 2 % Apply topically daily. Patient taking differently: Apply 1 application topically daily as needed (itching, skin irritation).  08/27/14  Yes Geradine Girt, DO  Oxycodone HCl 10 MG TABS Take 1 tablet (10 mg total) by mouth every 6 (six) hours as needed. 06/19/15  Yes Milinda Pointer, MD  pantoprazole (PROTONIX) 40 MG tablet Take 1 tablet (40 mg total) by mouth 2 (two) times daily. 11/24/14  Yes Thurnell Lose,  MD  polyethylene glycol (MIRALAX / GLYCOLAX) packet Take 17 g by mouth 2 (two) times daily. Mix in 8 oz liquid and drink 08/24/14  Yes Historical Provider, MD  bisacodyl (DULCOLAX) 10 MG suppository Place 1 suppository (10 mg total) rectally daily as needed for moderate constipation. Patient not taking: Reported on 07/06/2015 05/12/15   Modena Jansky, MD  Blood Glucose Monitoring Suppl (ACCU-CHEK AVIVA PLUS) W/DEVICE KIT 1 each by Does not apply route 4 (four) times daily - after meals and at bedtime. 12/13/14   Arnoldo Morale, MD  glucose blood (ACCU-CHEK AVIVA PLUS) test strip Use three times before meals and at bedtime 12/13/14   Arnoldo Morale, MD  lactulose (CHRONULAC) 10 GM/15ML solution Take 15 mLs (10 g total) by mouth 3 (three) times daily. Patient not taking: Reported on 07/06/2015 12/13/14   Arnoldo Morale, MD  Lancet Devices (ACCU-CHEK [facility]) lancets Use as instructed 12/13/14   Arnoldo Morale, MD  promethazine (PHENERGAN) 12.5 MG tablet Take 1 tablet (12.5 mg total) by mouth every 6 (six) hours as needed for nausea. 07/06/15   Jaime Pilcher Ward, PA-C   BP 203/103 mmHg  Pulse 100  Temp(Src) 98.8 F (37.1 C) (Oral)  Resp 19  SpO2 98%   LMP  (LMP Unknown) Physical Exam  Constitutional: She is oriented to person, place, and time. She appears well-developed and well-nourished.  Alert and in no acute distress  HENT:  Head: Normocephalic and atraumatic.  Cardiovascular: Normal heart sounds and intact distal pulses.  Exam reveals no gallop and no friction rub.   No murmur heard. Tacky but regular Good thrill L extremity   Pulmonary/Chest: Effort normal and breath sounds normal. No respiratory distress. She has no wheezes. She has no rales. She exhibits no tenderness.  Abdominal: She exhibits no mass. There is no rebound and no guarding.  Abdomen soft, non-distended Generalized abdominal tenderness, worst suprapubic Negative Murphy's Bowel sounds positive in all four quadrants  Musculoskeletal: She exhibits no edema.  Neurological: She is alert and oriented to person, place, and time.  Skin: Skin is warm and dry. No rash noted.  Psychiatric: She has a normal mood and affect. Her behavior is normal. Judgment and thought content normal.  Nursing note and vitals reviewed.   ED Course  Procedures (including critical care time) Labs Review Labs Reviewed  COMPREHENSIVE METABOLIC PANEL - Abnormal; Notable for the following:    Potassium 3.4 (*)    Chloride 95 (*)    Glucose, Bld 152 (*)    BUN 38 (*)    Creatinine, Ser 6.39 (*)    Albumin 2.6 (*)    AST 44 (*)    GFR calc non Af Amer 7 (*)    GFR calc Af Amer 8 (*)    All other components within normal limits  CBC - Abnormal; Notable for the following:    WBC 3.4 (*)    RBC 3.45 (*)    Hemoglobin 10.1 (*)    HCT 32.3 (*)    RDW 16.1 (*)    All other components within normal limits  URINALYSIS, ROUTINE W REFLEX MICROSCOPIC (NOT AT [facility]) - Abnormal; Notable for the following:    APPearance CLOUDY (*)    Glucose, UA 250 (*)    Hgb urine dipstick MODERATE (*)    Ketones, ur 15 (*)    Protein, ur >300 (*)    All other components within normal limits  URINE  MICROSCOPIC-ADD ON - Abnormal; Notable for the following:  Squamous Epithelial / LPF 6-30 (*)    Bacteria, UA RARE (*)    All other components within normal limits  CBG MONITORING, ED - Abnormal; Notable for the following:    Glucose-Capillary 133 (*)    All other components within normal limits  LIPASE, BLOOD    Imaging Review US Abdomen Limited  07/06/2015  CLINICAL DATA:  Abdominal pain.  Hepatitis-C. EXAM: US ABDOMEN LIMITED - RIGHT UPPER QUADRANT COMPARISON:  Abdominal ultrasound May 10, 2015; CT abdomen and pelvis May 12, 2015 FINDINGS: Gallbladder: There is mild sludge within the gallbladder. There are several echogenic foci which move and shadow consistent with gallstones. Largest individual gallstone measures 10 mm in length. No gallbladder wall thickening or pericholecystic fluid collection. No sonographic Murphy sign noted. Common bile duct: Diameter: 6 mm. No intrahepatic or extrahepatic biliary duct dilatation. Liver: No focal lesion identified. Liver echogenicity is rather coarse, a finding noted previously. IMPRESSION: Cholelithiasis and mild sludge in the gallbladder. Gallbladder wall is not thickened, and there is no pericholecystic fluid collection. The liver echogenicity is somewhat coarse, a finding that may be seen with underlying parenchymal disease such as hepatitis. No focal liver lesions are identified. No biliary duct dilatation appreciable. Electronically Signed   By: Lowella Grip III M.D.   On: 07/06/2015 14:23   I have personally reviewed and evaluated these images and lab results as part of my medical decision-making.   EKG Interpretation None      MDM   Final diagnoses:  Calculus of gallbladder without cholecystitis without obstruction  Elevated blood pressure  Dialysis patient ([place])   [name] is a dialysis patient who presents with abdominal pain, n/v. Labs reviewed. Negative CT on 10/17 U/S shows cholelithiasis and mild sludge, but no  thickening or fluid -  C/w previous US. Will recommend OP surgery f/up   1:45 PM - Patient re-evaluated, U/S results discussed. States pain has improved, but still present. Will give PO challenge to ensure no further emesis before dc home. BP has improved.   No episodes of emesis in ED, will dc to home with phenergan for nausea and recommend surgery follow up. Stressed the importance of taking BP medication regularly and following up with PCP. Patient has been informed by myself, Dr. Regenia Skeeter, and nursing staff that we do not have a reason to give her home pain medication. She is followed by pain management.  Patient seen by and discussed with Dr. Regenia Skeeter who agrees with treatment plan.    [facility] Ward, PA-C 07/06/15 8937  Sherwood Gambler, MD 07/07/15 [phone]

## 2015-07-07 ENCOUNTER — Telehealth: Payer: Self-pay | Admitting: Clinical

## 2015-07-07 NOTE — Telephone Encounter (Signed)
Someone answered phone by screaming "what?", then said it was the wrong number. Call to other number provided was unanswered. Attempt to f/u with pt, no message left.

## 2015-07-11 ENCOUNTER — Other Ambulatory Visit: Payer: Self-pay | Admitting: Pain Medicine

## 2015-07-14 ENCOUNTER — Other Ambulatory Visit: Payer: Self-pay | Admitting: Pain Medicine

## 2015-07-17 ENCOUNTER — Ambulatory Visit: Payer: Medicare Other | Attending: Pain Medicine | Admitting: Pain Medicine

## 2015-07-17 ENCOUNTER — Encounter: Payer: Self-pay | Admitting: Pain Medicine

## 2015-07-17 VITALS — BP 185/102 | HR 100 | Temp 98.2°F | Resp 18 | Ht 64.0 in | Wt 176.0 lb

## 2015-07-17 DIAGNOSIS — F259 Schizoaffective disorder, unspecified: Secondary | ICD-10-CM | POA: Insufficient documentation

## 2015-07-17 DIAGNOSIS — F329 Major depressive disorder, single episode, unspecified: Secondary | ICD-10-CM | POA: Diagnosis not present

## 2015-07-17 DIAGNOSIS — F419 Anxiety disorder, unspecified: Secondary | ICD-10-CM | POA: Diagnosis not present

## 2015-07-17 DIAGNOSIS — F319 Bipolar disorder, unspecified: Secondary | ICD-10-CM | POA: Diagnosis not present

## 2015-07-17 DIAGNOSIS — F1190 Opioid use, unspecified, uncomplicated: Secondary | ICD-10-CM | POA: Diagnosis not present

## 2015-07-17 DIAGNOSIS — F1410 Cocaine abuse, uncomplicated: Secondary | ICD-10-CM | POA: Diagnosis not present

## 2015-07-17 DIAGNOSIS — M25579 Pain in unspecified ankle and joints of unspecified foot: Secondary | ICD-10-CM | POA: Diagnosis present

## 2015-07-17 DIAGNOSIS — Z87891 Personal history of nicotine dependence: Secondary | ICD-10-CM | POA: Diagnosis not present

## 2015-07-17 DIAGNOSIS — B182 Chronic viral hepatitis C: Secondary | ICD-10-CM | POA: Insufficient documentation

## 2015-07-17 DIAGNOSIS — G40909 Epilepsy, unspecified, not intractable, without status epilepticus: Secondary | ICD-10-CM | POA: Insufficient documentation

## 2015-07-17 DIAGNOSIS — Z79891 Long term (current) use of opiate analgesic: Secondary | ICD-10-CM

## 2015-07-17 DIAGNOSIS — I120 Hypertensive chronic kidney disease with stage 5 chronic kidney disease or end stage renal disease: Secondary | ICD-10-CM | POA: Diagnosis not present

## 2015-07-17 DIAGNOSIS — G8929 Other chronic pain: Secondary | ICD-10-CM | POA: Diagnosis not present

## 2015-07-17 DIAGNOSIS — Z8673 Personal history of transient ischemic attack (TIA), and cerebral infarction without residual deficits: Secondary | ICD-10-CM | POA: Diagnosis not present

## 2015-07-17 DIAGNOSIS — F1990 Other psychoactive substance use, unspecified, uncomplicated: Secondary | ICD-10-CM

## 2015-07-17 DIAGNOSIS — M79603 Pain in arm, unspecified: Secondary | ICD-10-CM | POA: Diagnosis not present

## 2015-07-17 DIAGNOSIS — R892 Abnormal level of other drugs, medicaments and biological substances in specimens from other organs, systems and tissues: Secondary | ICD-10-CM

## 2015-07-17 DIAGNOSIS — Z992 Dependence on renal dialysis: Secondary | ICD-10-CM | POA: Diagnosis not present

## 2015-07-17 DIAGNOSIS — K219 Gastro-esophageal reflux disease without esophagitis: Secondary | ICD-10-CM | POA: Diagnosis not present

## 2015-07-17 DIAGNOSIS — I509 Heart failure, unspecified: Secondary | ICD-10-CM | POA: Insufficient documentation

## 2015-07-17 DIAGNOSIS — F1120 Opioid dependence, uncomplicated: Secondary | ICD-10-CM

## 2015-07-17 DIAGNOSIS — Z79899 Other long term (current) drug therapy: Secondary | ICD-10-CM

## 2015-07-17 NOTE — Progress Notes (Signed)
Safety precautions to be maintained throughout the outpatient stay will include: orient to surroundings, keep bed in low position, maintain call [place] within reach at all times, provide assistance with transfer out of bed and ambulation.  hERE TODAY FOR MED REFILL Med remaining 0/120 filled 06/20/15 states she thinks her daughter took them

## 2015-07-17 NOTE — Progress Notes (Signed)
Patient's Name: [name] MRN: [id number] DOB: 04-15-1966 DOS: 07/17/2015  Primary Reason(s) for Visit: Encounter for Medication Management CC: Foot Pain and Arm Pain   HPI:   Ms. Schalow is a 49 y.o. year old, female patient, who returns today as an established patient. 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 ([place]); Nontraumatic amputation of left foot ([place]); Closed fracture of part of upper end of humerus; Acute renal failure ([place]); Metabolic acidosis; Diabetes mellitus with nephropathy ([place]); Hypokalemia; RUQ abdominal pain; Hypertensive urgency; Acute hyponatremia; Leukopenia; Anemia of chronic kidney failure; Chronic renal failure, stage 5 ([place]); Dialysis patient [facility]); Pancreatitis; Left arm pain; Diabetes mellitus with stage 5 chronic kidney disease ([place]); Left forearm pain; ESRD (end stage renal disease) on dialysis ([place]); Chronic pain; Chronic pain syndrome; Long term current use of opiate analgesic; Cocaine abuse; Chronic right elbow pain; Chronic left foot pain; History of TIA (transient ischemic attack); History of stroke; History of vitamin D deficiency; Substance use disorder Risk: "Very High"; Long term prescription opiate use; Opiate use; Opiate dependence, continuous ([place]); and Abnormal drug screen (06/19/2015) (Cocaine matabolites detected) on her problem list.. Her primarily concern today is the Foot Pain and Arm Pain     The patient comes into the clinic today for pharmacological management of her current chronic pain. However, her last urine drug screen test was positive for Benzoylecgonine, a  cocaine metabolite. Our questionnaire revealed that the patient had denied the use of any illegal substances. Patient indicates that she does not take any cocaine and this most of being a false-positive. However, the standard assay used to detect cocaine is likely the most accurate of the DOA screening tests. The specificity of these immunoassays, which detect the cocaine metabolite benzoylecgonine, is extremely high, and false-positive results are extremely uncommon. Having said this, the urine drug screen test that we use at our facility is conducted by [employer] using mass spectrometry and liquid chromatography. Because of this, I am confident that the source of this cocaine metabolite was from cocaine itself and not a false-positive from other medication or from the fact that she has end-stage renal disease, as the patient would want [place] to believe. At the patient being honest with me however given her another opportunity, however because she chose to lie to me, I will not be this benevolent. I have personally informed the patient that she is being discharged from our service and that we will remain available for for emergencies only for the next 30 days.   Today's Pain Score: 10-Worst pain ever, clinically she looks like a 1-2/10. Clear symptom exaggeration. Reported level of pain is incompatible with clinical observations. Pain Type: Chronic pain Pain Location: Foot (right arm) Pain Orientation: Right, Left Pain Descriptors / Indicators: Constant Pain Frequency: Constant  Date of Last Visit: 06/19/15 Service Provided on Last Visit: Med Refill  Pharmacotherapy Review:   Side-effects or Adverse reactions: None reported Effectiveness: Described as ineffective and would like to make some changes. The patient today requesting an increase in her pain medication which we have denied. Onset of action: Within expected pharmacological parameters Duration of action: Within normal limits for  medication Peak effect: Timing and results are as  within normal expected parameters [place] PMP: Noncompliant. The patient had a prescription written by Dr. Tana Coast Ripudeep, in violation of our medication agreement. Hospital associated UDS Results:  Lab Results  Component Value Date   THCU NONE DETECTED 05/10/2015   COCAINSCRNUR POSITIVE* 05/10/2015   AMPHETMU NONE DETECTED 05/10/2015   UDS Results: The patient's last UDS on 06/19/2015 came back positive for Benzoylecgonine at 24,003 nanograms per mL. This is a very specific metabolite of cocaine with no chance of a false positive. Therefore, the patient is not only noncompliant, but she lied to me. UDS Interpretation: Patient appears to be compliant with practice rules and regulations Medication Assessment Form: Discrepancies found between patient's report and information collected. The patient had indicated that she did not take any illegal substances but clearly this was not true. Treatment compliance: Noncompliant. Substance Use Disorder (SUD) Risk Level: Very high risk level. Pharmacologic Plan: Due to the patient's noncompliance with the medication regiment and the use of any illegal substance, we will no longer be prescribing controlled substances. Furthermore, because the patient elected to be this honest with me and no longer feel that we have a viable patient physician relationship and therefore I will be discharging her from my service. The patient was personally informed of my choice today.  Last Available Lab Work: Admission on 07/06/2015, Discharged on 07/06/2015  Component Date Value Ref Range Status  . Lipase 07/06/2015 42  11 - 51 U/L Final  . Sodium 07/06/2015 136  135 - 145 mmol/L Final  . Potassium 07/06/2015 3.4* 3.5 - 5.1 mmol/L Final  . Chloride 07/06/2015 95* 101 - 111 mmol/L Final  . CO2 07/06/2015 30  22 - 32 mmol/L Final  . Glucose, Bld 07/06/2015 152* 65 - 99 mg/dL Final  . BUN 07/06/2015 38* 6 - 20 mg/dL Final  .  Creatinine, Ser 07/06/2015 6.39* 0.44 - 1.00 mg/dL Final  . Calcium 07/06/2015 9.0  8.9 - 10.3 mg/dL Final  . Total Protein 07/06/2015 6.5  6.5 - 8.1 g/dL Final  . Albumin 07/06/2015 2.6* 3.5 - 5.0 g/dL Final  . AST 07/06/2015 44* 15 - 41 U/L Final  . ALT 07/06/2015 34  14 - 54 U/L Final  . Alkaline Phosphatase 07/06/2015 73  38 - 126 U/L Final  . Total Bilirubin 07/06/2015 0.9  0.3 - 1.2 mg/dL Final  . GFR calc non Af Amer 07/06/2015 7* >60 mL/min Final  . GFR calc Af Amer 07/06/2015 8* >60 mL/min Final  . Anion gap 07/06/2015 11  5 - 15 Final  . WBC 07/06/2015 3.4* 4.0 - 10.5 K/uL Final  . RBC 07/06/2015 3.45* 3.87 - 5.11 MIL/uL Final  . Hemoglobin 07/06/2015 10.1* 12.0 - 15.0 g/dL Final  . HCT 07/06/2015 32.3* 36.0 - 46.0 % Final  . MCV 07/06/2015 93.6  78.0 - 100.0 fL Final  . MCH 07/06/2015 29.3  26.0 - 34.0 pg Final  . MCHC 07/06/2015 31.3  30.0 - 36.0 g/dL Final  . RDW 07/06/2015 16.1* 11.5 - 15.5 % Final  . Platelets 07/06/2015 171  150 - 400 K/uL Final  . Color, Urine 07/06/2015 YELLOW  YELLOW Final  . APPearance 07/06/2015 CLOUDY* CLEAR Final  . Specific Gravity, Urine 07/06/2015 1.016  1.005 - 1.030 Final  . pH 07/06/2015 7.5  5.0 - 8.0 Final  . Glucose, UA 07/06/2015 250* NEGATIVE mg/dL Final  . Hgb urine dipstick 07/06/2015 MODERATE* NEGATIVE Final  . Bilirubin Urine 07/06/2015 NEGATIVE  NEGATIVE Final  . Ketones, ur 07/06/2015  15* NEGATIVE mg/dL Final  . Protein, ur 07/06/2015 >300* NEGATIVE mg/dL Final  . Nitrite 07/06/2015 NEGATIVE  NEGATIVE Final  . Leukocytes, UA 07/06/2015 NEGATIVE  NEGATIVE Final  . Glucose-Capillary 07/06/2015 133* 65 - 99 mg/dL Final  . Squamous Epithelial / LPF 07/06/2015 6-30* NONE SEEN Final  . WBC, UA 07/06/2015 0-5  0 - 5 WBC/hpf Final  . RBC / HPF 07/06/2015 0-5  0 - 5 RBC/hpf Final  . Bacteria, UA 07/06/2015 RARE* NONE SEEN Final  Orders Only on 06/19/2015  Component Date Value Ref Range Status  . Report Summary 06/19/2015 FINAL    Final  . PDF 06/19/2015 .   Final  Admission on 05/09/2015, Discharged on 05/12/2015  Component Date Value Ref Range Status  . WBC 05/10/2015 3.5* 4.0 - 10.5 K/uL Final  . RBC 05/10/2015 3.74* 3.87 - 5.11 MIL/uL Final  . Hemoglobin 05/10/2015 9.8* 12.0 - 15.0 g/dL Final  . HCT 05/10/2015 31.2* 36.0 - 46.0 % Final  . MCV 05/10/2015 83.4  78.0 - 100.0 fL Final  . MCH 05/10/2015 26.2  26.0 - 34.0 pg Final  . MCHC 05/10/2015 31.4  30.0 - 36.0 g/dL Final  . RDW 05/10/2015 15.7* 11.5 - 15.5 % Final  . Platelets 05/10/2015 177  150 - 400 K/uL Final  . Neutrophils Relative % 05/10/2015 77   Final  . Neutro Abs 05/10/2015 2.7  1.7 - 7.7 K/uL Final  . Lymphocytes Relative 05/10/2015 12   Final  . Lymphs Abs 05/10/2015 0.4* 0.7 - 4.0 K/uL Final  . Monocytes Relative 05/10/2015 8   Final  . Monocytes Absolute 05/10/2015 0.3  0.1 - 1.0 K/uL Final  . Eosinophils Relative 05/10/2015 2   Final  . Eosinophils Absolute 05/10/2015 0.1  0.0 - 0.7 K/uL Final  . Basophils Relative 05/10/2015 1   Final  . Basophils Absolute 05/10/2015 0.0  0.0 - 0.1 K/uL Final  . Sodium 05/10/2015 127* 135 - 145 mmol/L Final  . Potassium 05/10/2015 4.0  3.5 - 5.1 mmol/L Final  . Chloride 05/10/2015 91* 101 - 111 mmol/L Final  . CO2 05/10/2015 24  22 - 32 mmol/L Final  . Glucose, Bld 05/10/2015 314* 65 - 99 mg/dL Final  . BUN 05/10/2015 28* 6 - 20 mg/dL Final  . Creatinine, Ser 05/10/2015 6.61* 0.44 - 1.00 mg/dL Final  . Calcium 05/10/2015 8.1* 8.9 - 10.3 mg/dL Final  . Total Protein 05/10/2015 6.8  6.5 - 8.1 g/dL Final  . Albumin 05/10/2015 2.2* 3.5 - 5.0 g/dL Final  . AST 05/10/2015 44* 15 - 41 U/L Final  . ALT 05/10/2015 53  14 - 54 U/L Final  . Alkaline Phosphatase 05/10/2015 112  38 - 126 U/L Final  . Total Bilirubin 05/10/2015 1.0  0.3 - 1.2 mg/dL Final  . GFR calc non Af Amer 05/10/2015 7* >60 mL/min Final  . GFR calc Af Amer 05/10/2015 8* >60 mL/min Final  . Anion gap 05/10/2015 12  5 - 15 Final  . Lipase  05/10/2015 150* 22 - 51 U/L Final  . Opiates 05/10/2015 POSITIVE* NONE DETECTED Final  . Cocaine 05/10/2015 POSITIVE* NONE DETECTED Final  . Benzodiazepines 05/10/2015 NONE DETECTED  NONE DETECTED Final  . Amphetamines 05/10/2015 NONE DETECTED  NONE DETECTED Final  . Tetrahydrocannabinol 05/10/2015 NONE DETECTED  NONE DETECTED Final  . Barbiturates 05/10/2015 NONE DETECTED  NONE DETECTED Final  . Levetiracetam Lvl 05/10/2015 13.8  10.0 - 40.0 ug/mL Final  . Prothrombin Time 05/10/2015 17.4* 11.6 -  15.2 seconds Final  . INR 05/10/2015 1.41  0.00 - 1.49 Final  . aPTT 05/10/2015 32  24 - 37 seconds Final  . Sodium 05/10/2015 127* 135 - 145 mmol/L Final  . Potassium 05/10/2015 4.3  3.5 - 5.1 mmol/L Final  . Chloride 05/10/2015 96* 101 - 111 mmol/L Final  . CO2 05/10/2015 22  22 - 32 mmol/L Final  . Glucose, Bld 05/10/2015 298* 65 - 99 mg/dL Final  . BUN 05/10/2015 32* 6 - 20 mg/dL Final  . Creatinine, Ser 05/10/2015 6.76* 0.44 - 1.00 mg/dL Final  . Calcium 05/10/2015 8.1* 8.9 - 10.3 mg/dL Final  . Total Protein 05/10/2015 7.1  6.5 - 8.1 g/dL Final  . Albumin 05/10/2015 2.1* 3.5 - 5.0 g/dL Final  . AST 05/10/2015 49* 15 - 41 U/L Final  . ALT 05/10/2015 51  14 - 54 U/L Final  . Alkaline Phosphatase 05/10/2015 103  38 - 126 U/L Final  . Total Bilirubin 05/10/2015 0.8  0.3 - 1.2 mg/dL Final  . GFR calc non Af Amer 05/10/2015 7* >60 mL/min Final  . GFR calc Af Amer 05/10/2015 8* >60 mL/min Final  . Anion gap 05/10/2015 9  5 - 15 Final  . WBC 05/10/2015 3.5* 4.0 - 10.5 K/uL Final  . RBC 05/10/2015 3.90  3.87 - 5.11 MIL/uL Final  . Hemoglobin 05/10/2015 10.6* 12.0 - 15.0 g/dL Final  . HCT 05/10/2015 32.0* 36.0 - 46.0 % Final  . MCV 05/10/2015 82.1  78.0 - 100.0 fL Final  . MCH 05/10/2015 27.2  26.0 - 34.0 pg Final  . MCHC 05/10/2015 33.1  30.0 - 36.0 g/dL Final  . RDW 05/10/2015 15.8* 11.5 - 15.5 % Final  . Platelets 05/10/2015 139* 150 - 400 K/uL Final  . Glucose-Capillary 05/10/2015 310*  65 - 99 mg/dL Final  . Cholesterol 05/10/2015 71  0 - 200 mg/dL Final  . Triglycerides 05/10/2015 62  <150 mg/dL Final  . HDL 05/10/2015 23* >40 mg/dL Final  . Total CHOL/HDL Ratio 05/10/2015 3.1   Final  . VLDL 05/10/2015 12  0 - 40 mg/dL Final  . LDL Cholesterol 05/10/2015 36  0 - 99 mg/dL Final  . Glucose-Capillary 05/10/2015 292* 65 - 99 mg/dL Final  . Glucose-Capillary 05/10/2015 152* 65 - 99 mg/dL Final  . HCV Ab 05/11/2015 >11.0* 0.0 - 0.9 s/co ratio Final  . HCV RNA Qnt(log copy/mL) 05/11/2015 6.545   Corrected  . HepC Qn 05/11/2015 3510000   Final  . Test Information 05/11/2015 Comment   Final  . Hcv Genotype 05/11/2015 Comment   Final  . Sodium 05/11/2015 136  135 - 145 mmol/L Final  . Potassium 05/11/2015 3.2* 3.5 - 5.1 mmol/L Final  . Chloride 05/11/2015 99* 101 - 111 mmol/L Final  . CO2 05/11/2015 31  22 - 32 mmol/L Final  . Glucose, Bld 05/11/2015 60* 65 - 99 mg/dL Final  . BUN 05/11/2015 6  6 - 20 mg/dL Final  . Creatinine, Ser 05/11/2015 3.16* 0.44 - 1.00 mg/dL Final  . Calcium 05/11/2015 7.2* 8.9 - 10.3 mg/dL Final  . Total Protein 05/11/2015 5.6* 6.5 - 8.1 g/dL Final  . Albumin 05/11/2015 1.8* 3.5 - 5.0 g/dL Final  . AST 05/11/2015 47* 15 - 41 U/L Final  . ALT 05/11/2015 43  14 - 54 U/L Final  . Alkaline Phosphatase 05/11/2015 78  38 - 126 U/L Final  . Total Bilirubin 05/11/2015 0.5  0.3 - 1.2 mg/dL Final  . GFR calc  non Af Amer 05/11/2015 16* >60 mL/min Final  . GFR calc Af Amer 05/11/2015 19* >60 mL/min Final  . Anion gap 05/11/2015 6  5 - 15 Final  . Lipase 05/11/2015 47  22 - 51 U/L Final  . Glucose-Capillary 05/10/2015 90  65 - 99 mg/dL Final  . Glucose-Capillary 05/11/2015 59* 65 - 99 mg/dL Final  . Glucose-Capillary 05/11/2015 100* 65 - 99 mg/dL Final  . Glucose-Capillary 05/11/2015 73  65 - 99 mg/dL Final  . Glucose-Capillary 05/11/2015 61* 65 - 99 mg/dL Final  . Glucose-Capillary 05/11/2015 117* 65 - 99 mg/dL Final  . WBC 05/12/2015 2.3* 4.0 - 10.5  K/uL Final  . RBC 05/12/2015 2.94* 3.87 - 5.11 MIL/uL Final  . Hemoglobin 05/12/2015 7.8* 12.0 - 15.0 g/dL Final  . HCT 05/12/2015 26.0* 36.0 - 46.0 % Final  . MCV 05/12/2015 88.4  78.0 - 100.0 fL Final  . MCH 05/12/2015 26.5  26.0 - 34.0 pg Final  . MCHC 05/12/2015 30.0  30.0 - 36.0 g/dL Final  . RDW 05/12/2015 16.9* 11.5 - 15.5 % Final  . Platelets 05/12/2015 121* 150 - 400 K/uL Final  . Glucose-Capillary 05/11/2015 294* 65 - 99 mg/dL Final  . Glucose-Capillary 05/11/2015 310* 65 - 99 mg/dL Final  . Sodium 05/12/2015 132* 135 - 145 mmol/L Final  . Potassium 05/12/2015 4.1  3.5 - 5.1 mmol/L Final  . Chloride 05/12/2015 97* 101 - 111 mmol/L Final  . CO2 05/12/2015 27  22 - 32 mmol/L Final  . Glucose, Bld 05/12/2015 105* 65 - 99 mg/dL Final  . BUN 05/12/2015 13  6 - 20 mg/dL Final  . Creatinine, Ser 05/12/2015 4.68* 0.44 - 1.00 mg/dL Final  . Calcium 05/12/2015 7.6* 8.9 - 10.3 mg/dL Final  . Phosphorus 05/12/2015 4.5  2.5 - 4.6 mg/dL Final  . Albumin 05/12/2015 2.0* 3.5 - 5.0 g/dL Final  . GFR calc non Af Amer 05/12/2015 10* >60 mL/min Final  . GFR calc Af Amer 05/12/2015 12* >60 mL/min Final  . Anion gap 05/12/2015 8  5 - 15 Final  . Glucose-Capillary 05/12/2015 168* 65 - 99 mg/dL Final  . WBC 05/12/2015 2.4* 4.0 - 10.5 K/uL Final  . RBC 05/12/2015 3.86* 3.87 - 5.11 MIL/uL Final  . Hemoglobin 05/12/2015 10.2* 12.0 - 15.0 g/dL Final  . HCT 05/12/2015 34.2* 36.0 - 46.0 % Final  . MCV 05/12/2015 88.6  78.0 - 100.0 fL Final  . MCH 05/12/2015 26.4  26.0 - 34.0 pg Final  . MCHC 05/12/2015 29.8* 30.0 - 36.0 g/dL Final  . RDW 05/12/2015 16.7* 11.5 - 15.5 % Final  . Platelets 05/12/2015 116* 150 - 400 K/uL Final  . Glucose-Capillary 05/12/2015 371* 65 - 99 mg/dL Final  . Comment 1 05/12/2015 Document in Chart   Final  . Hepatitis C Genotype 05/11/2015 1a   Final  . Please Note (HCV): 05/11/2015 Comment   Final  Admission on 05/01/2015, Discharged on 05/07/2015  No results displayed  because visit has over 200 results.      Allergies: Ms. Reisler is allergic to gabapentin; gabapentin; lyrica; other; penicillins; saphris; saphris; tramadol; and tramadol.  Meds: The patient has a current medication list which includes the following prescription(s): amlodipine, atorvastatin, accu-chek aviva plus, vitamin d3, coq10, b-12, diphenhydramine, ferrous sulfate, folic acid, insulin aspart, insulin glargine, accu-chek softclix, levetiracetam, multivitamin with minerals, mupirocin cream, niacin, oxycodone hcl, polyethylene glycol, promethazine, vitamin a, amphetamine-dextroamphetamine, bisacodyl, carvedilol, folic acid, glucose blood, lactulose, and pantoprazole. Requested Prescriptions  No prescriptions requested or ordered in this encounter    ROS: Constitutional: Afebrile, no chills, well hydrated and well nourished Gastrointestinal: negative Musculoskeletal:negative Neurological: negative Behavioral/Psych: negative  PFSH: Medical:  Ms. Vitello  has a past medical history of Neuropathy ([place]); Glaucoma; Bipolar 1 disorder ([place]); Refusal of blood transfusions as patient is Jehovah's Witness; TIA (transient ischemic attack) (2010); MRSA infection (?2014); Bipolar disorder, unspecified ([place]) (02/06/2014); High cholesterol; Type II diabetes mellitus ([place]); Hepatitis C; Chronic pain syndrome; ESRD on hemodialysis ([place]); Schizoaffective disorder, unspecified condition (02/08/2014); Seizure ([place]) (dx'd 2014); Chronic combined systolic and diastolic CHF (congestive heart failure) ([place]); Anemia; Hypertension; Depression; Anxiety; GERD (gastroesophageal reflux disease); History of hiatal hernia; Headache; History of vitamin D deficiency (06/20/2015); and Stroke [facility]). Family: family history includes Hypertension in her father and mother. Surgical:  has past surgical history that includes Amputation (Left, 05/21/2013); Tubal ligation; Colonoscopy with propofol (N/A, 06/22/2013); Abdominal  hysterectomy (2014); Tonsillectomy (1970's); Refractive surgery (Bilateral, 2013); Tubal ligation (2000); Abdominal hysterectomy (2010); Foot Amputation (Left); Eye surgery (Bilateral, 2010); AV fistula placement (Left, 05/04/2015); and Esophagogastroduodenoscopy (N/A, 05/11/2015). Tobacco:  reports that she quit smoking about 3 months ago. Her smoking use included Cigarettes. She has a 20 pack-year smoking history. She has never used smokeless tobacco. Alcohol:  reports that she does not drink alcohol. Drug:  reports that she does not use illicit drugs.  Physical Exam: Vitals:  Today's Vitals   07/17/15 0929 07/17/15 0932  BP: 185/102   Pulse: 100   Temp: 98.2 F (36.8 C)   TempSrc: Oral   Resp: 18   Height: 5\' 4"  (1.626 m)   Weight: 176 lb (79.833 kg)   SpO2: 99%   PainSc:  10-Worst pain ever  Calculated BMI: Body mass index is 30.2 kg/(m^2). General appearance: alert, cooperative, appears stated age and no distress Eyes: PERLA Respiratory: No evidence respiratory distress, no audible rales or ronchi and no use of accessory muscles of respiration Neck: no adenopathy, no carotid bruit, no JVD, supple, symmetrical, trachea midline and thyroid not enlarged, symmetric, no tenderness/mass/nodules  Cervical Spine ROM: Adequate for flexion, extension, rotation, and lateral bending Palpation: No palpable trigger points  Upper Extremities ROM: Adequate bilaterally Strength: 5/5 for all flexors and extensors of the upper extremity, bilaterally Pulses: Palpable bilaterally Neurologic: No allodynia, No hyperesthesia, No hyperpathia and No sensory abnormalities  Lumbar Spine ROM: Adequate for flexion, extension, rotation, and lateral bending Palpation: No palpable trigger points Lumbar Hyperextension and rotation: Non-contributory Patrick's Maneuver: Non-contributory  Lower Extremities ROM: Adequate bilaterally Strength: 5/5 for all flexors and extensors of the lower extremity,  bilaterally Pulses: Palpable bilaterally Neurologic: No allodynia, No hyperesthesia, No hyperpathia, No sensory abnormalities and No antalgic gait or posture  Assessment: Encounter Diagnosis:  Primary Diagnosis: Chronic pain [G89.29]  Plan: Interventional Therapies: None at this point. The patient is being discharged from our services.  Lamoni was seen today for foot pain and arm pain.  Diagnoses and all orders for this visit:  Chronic pain  Substance use disorder Risk: "Very High"  Opiate use  Cocaine abuse  Opiate dependence, continuous ([place])  Long term prescription opiate use  Long term current use of opiate analgesic -     Drugs of abuse screen w/o alc, rtn urine-sln  Abnormal drug screen (06/19/2015) (Cocaine matabolites detected)     There are no Patient Instructions on file for this visit. Medications discontinued today:  There are no discontinued medications. Medications administered today:  Ms. Walles does not currently have medications  on file.  Primary Care Physician: [name], MD Location: [facility] Note by: Kathlen Brunswick Dossie Arbour, M.D, DABA, DABAPM, DABPM, DABIPP, FIPP

## 2015-07-21 IMAGING — CR DG FOOT COMPLETE 3+V*L*
3 series · 3 of 3 positions shown · non-contrast
Comparison: 02/21/2014

CLINICAL DATA: Left foot pain and bruising

EXAM:
LEFT FOOT - COMPLETE 3+ VIEW

[x foot ap left]
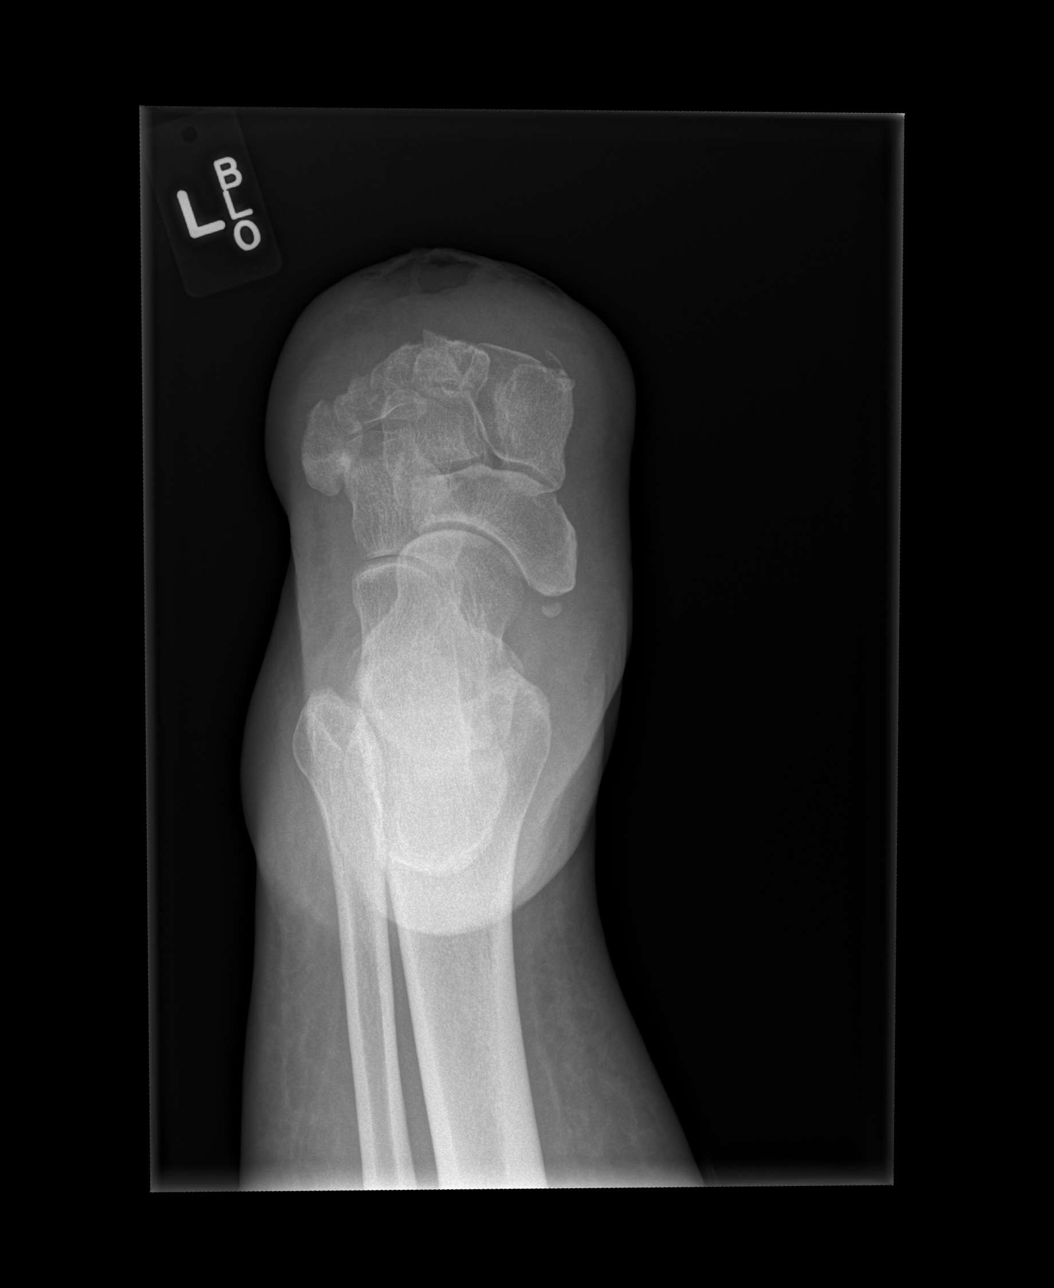

[x foot obl left]
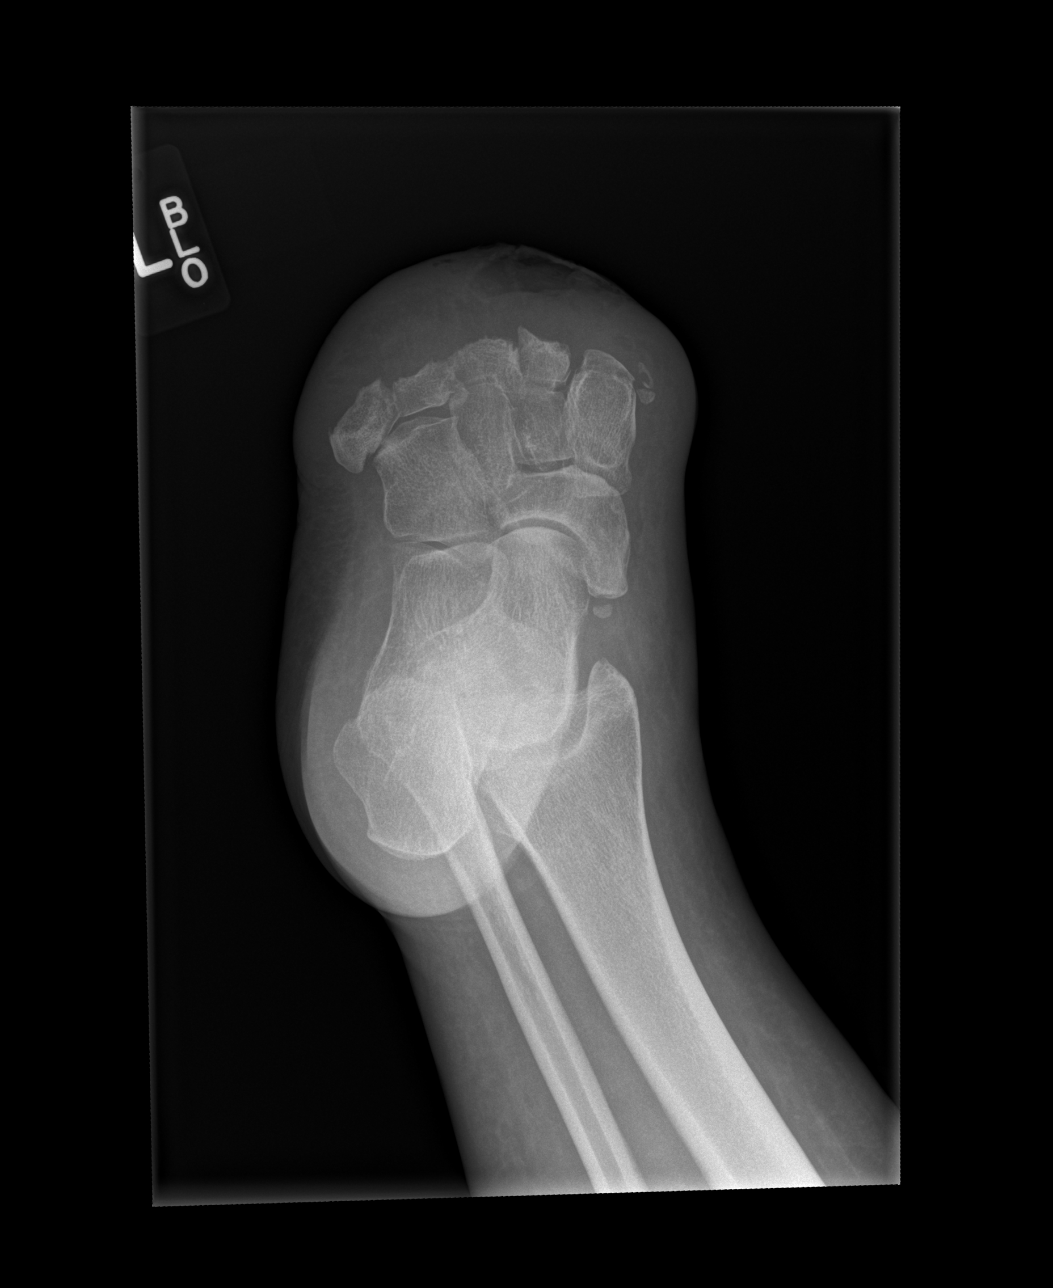

[x foot lat left]
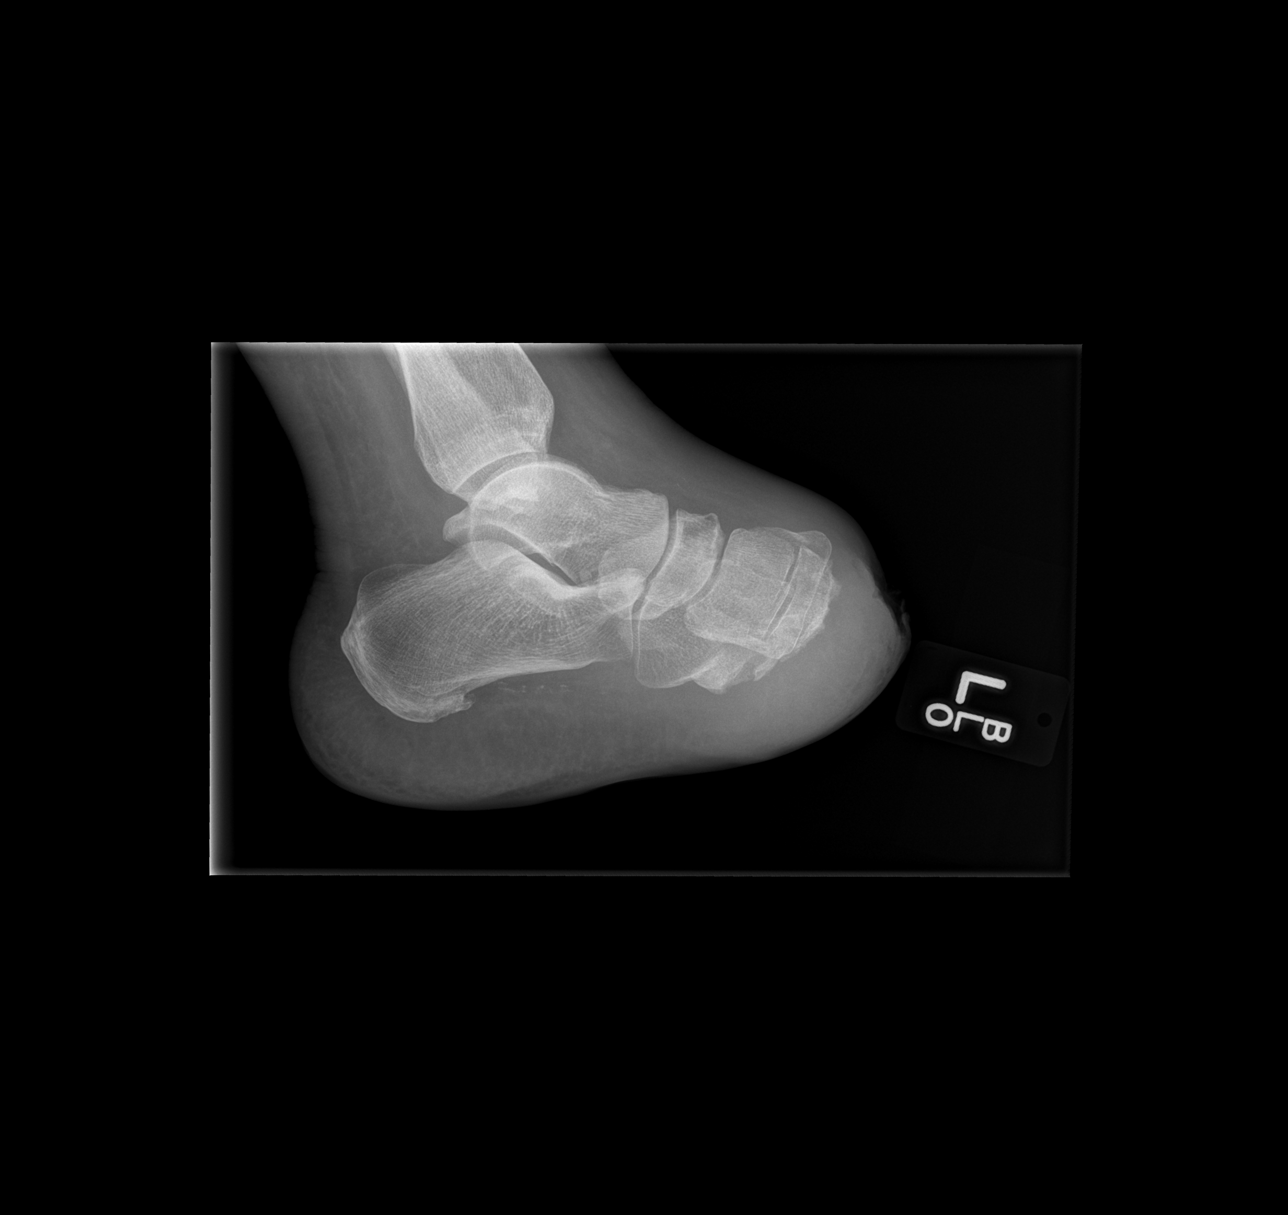

[3 of 3 positions shown; findings below may reference images not displayed]

FINDINGS: Evidence of pan-transmetatarsal amputation reidentified. Soft tissue
defect is noted at the distal aspect of the stump. An osseous
fragment is noted adjacent to the medial cuneiform. This is stable.
No new osseous destruction or radiopaque foreign body.
IMPRESSION: Stable evidence of transmetatarsal amputation with soft tissue
defect at the distal aspect of the stump.

## 2015-07-21 IMAGING — CR DG SHOULDER 2+V*R*
2 series · 2 of 2 positions shown · non-contrast
Comparison: Previous imaging, please see prior reports from today
and 03/07/2014

CLINICAL DATA: Fall, right arm pain

EXAM:
RIGHT SHOULDER - 2+ VIEW

[w shoulder external right]
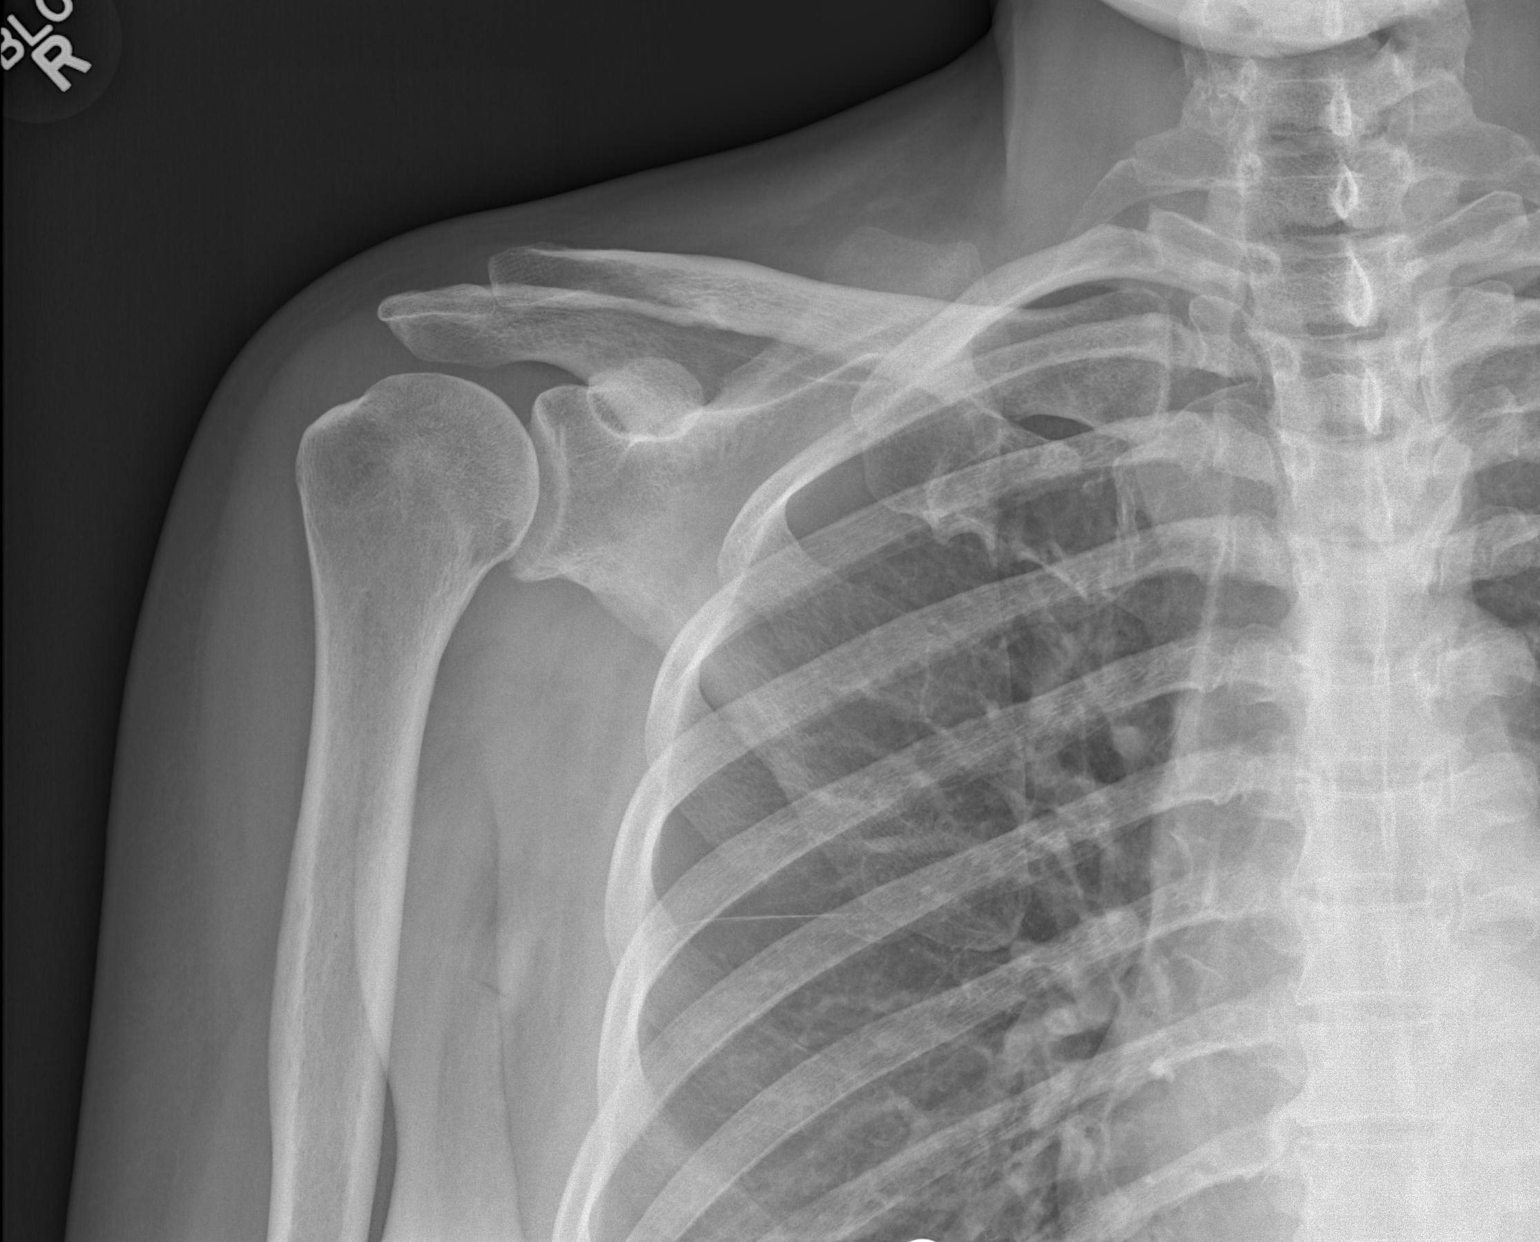

[w shoulder y-view right]
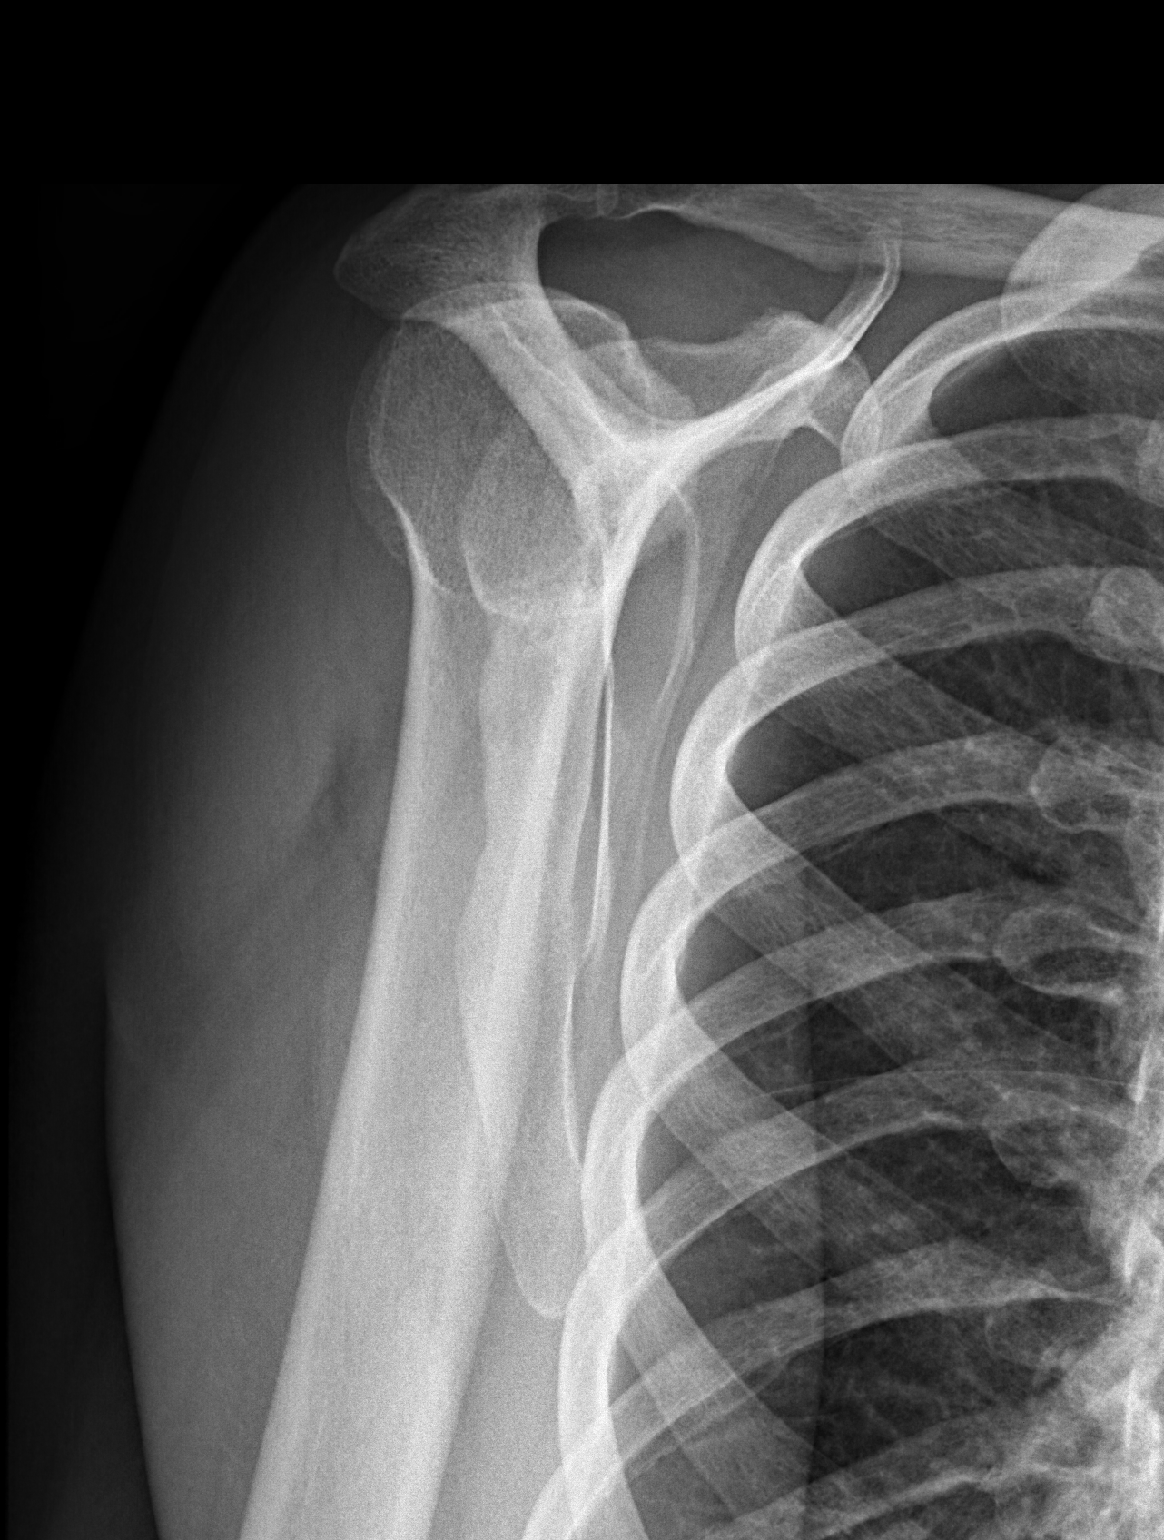

[2 of 2 positions shown; findings below may reference images not displayed]

FINDINGS: There is no evidence of fracture or dislocation involving the
proximal humerus. There is no evidence of arthropathy or other focal
bone abnormality. Soft tissues are unremarkable.
IMPRESSION: Negative.

## 2015-07-21 IMAGING — CR DG HUMERUS 2V *R*
2 series · 2 of 2 positions shown · non-contrast
Comparison: Elbow radiographs same date, dictated separately

ADDENDUM:
Comparison is now available to previous radiographs 03/07/2014 and
the supracondylar fracture is not acute. I discussed these findings
with Tonisha, the physician's assistant taking care of the patient,
on 04/11/2014 at [DATE] p.m..
CLINICAL DATA: Fall, right humerus pain

EXAM:
RIGHT HUMERUS - 2+ VIEW

[w humerus ap right]
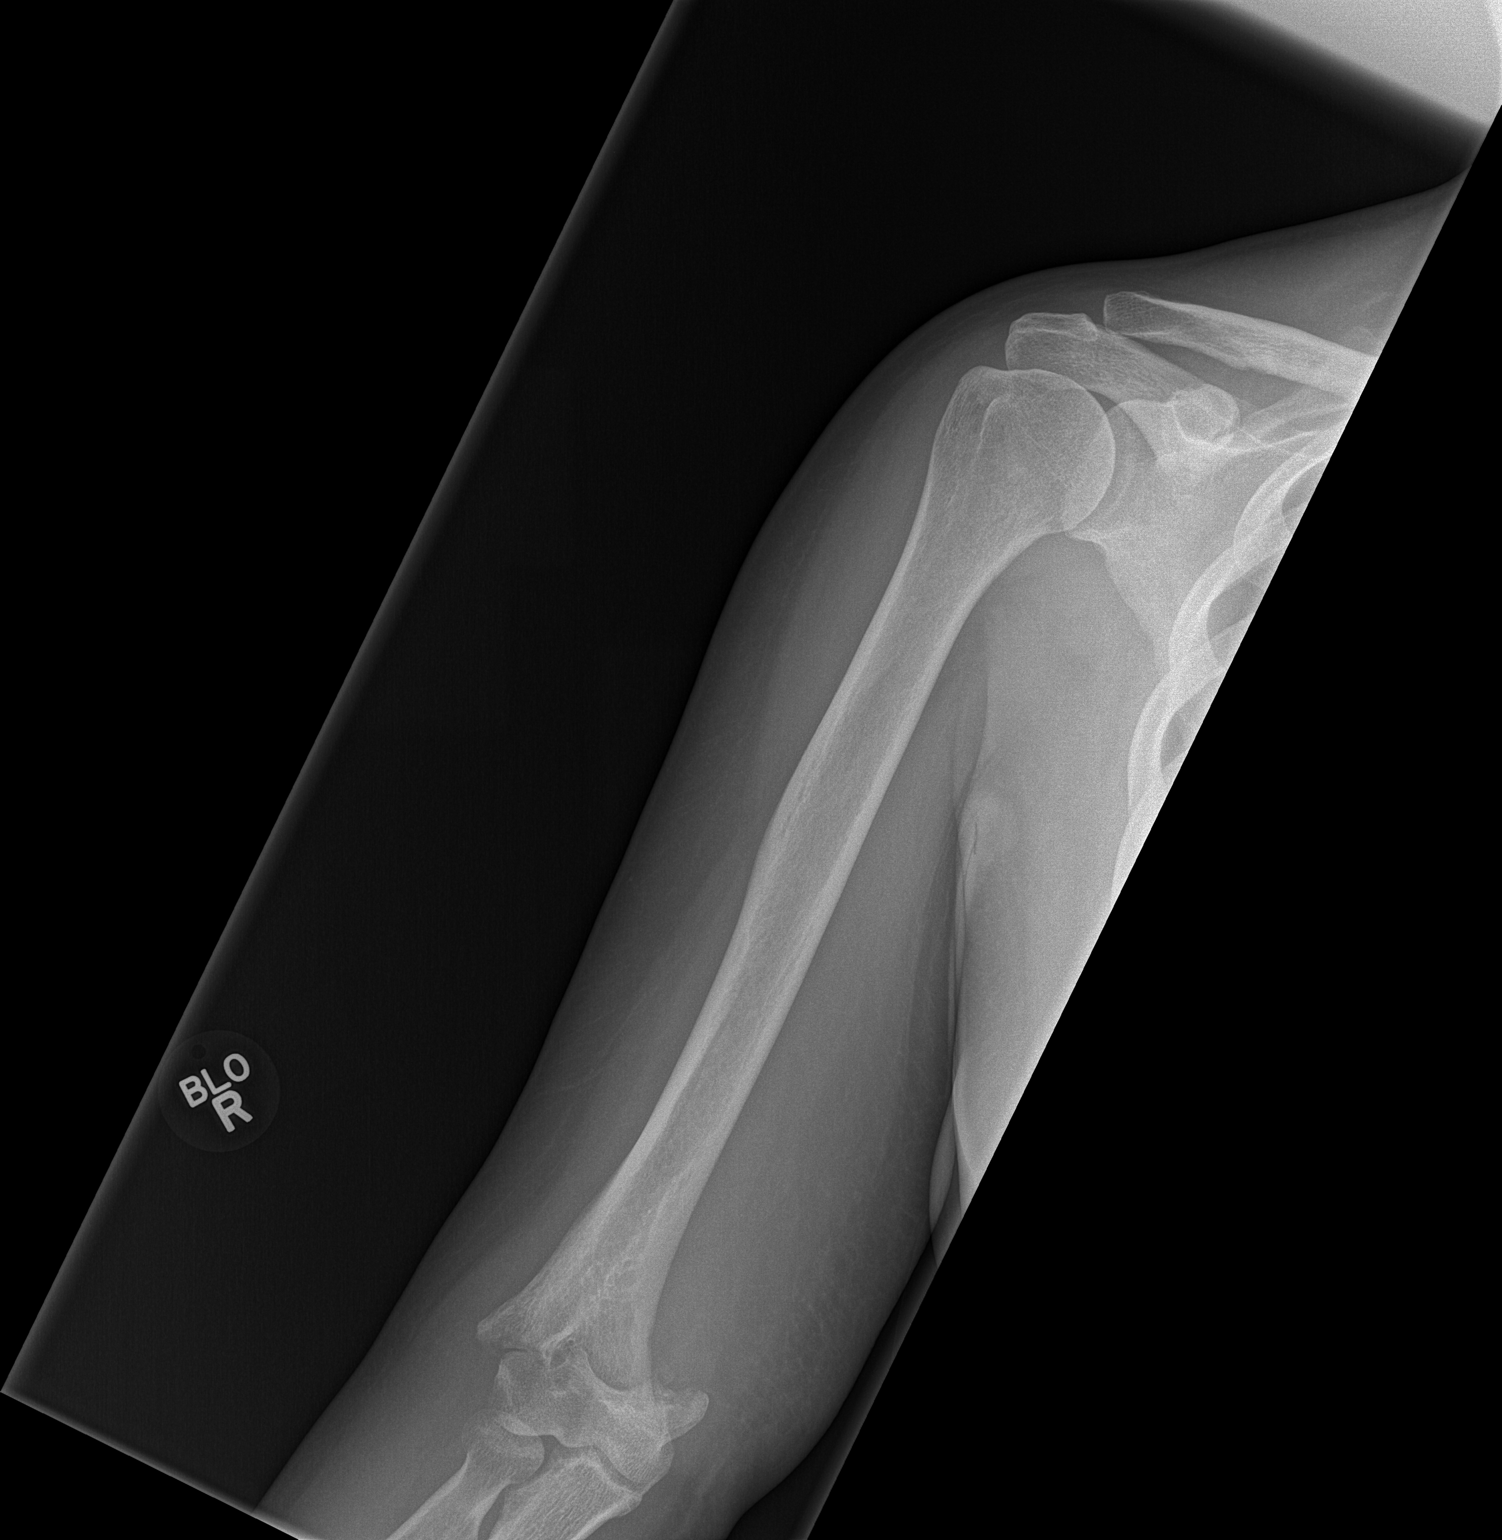

[w humerus lat right]
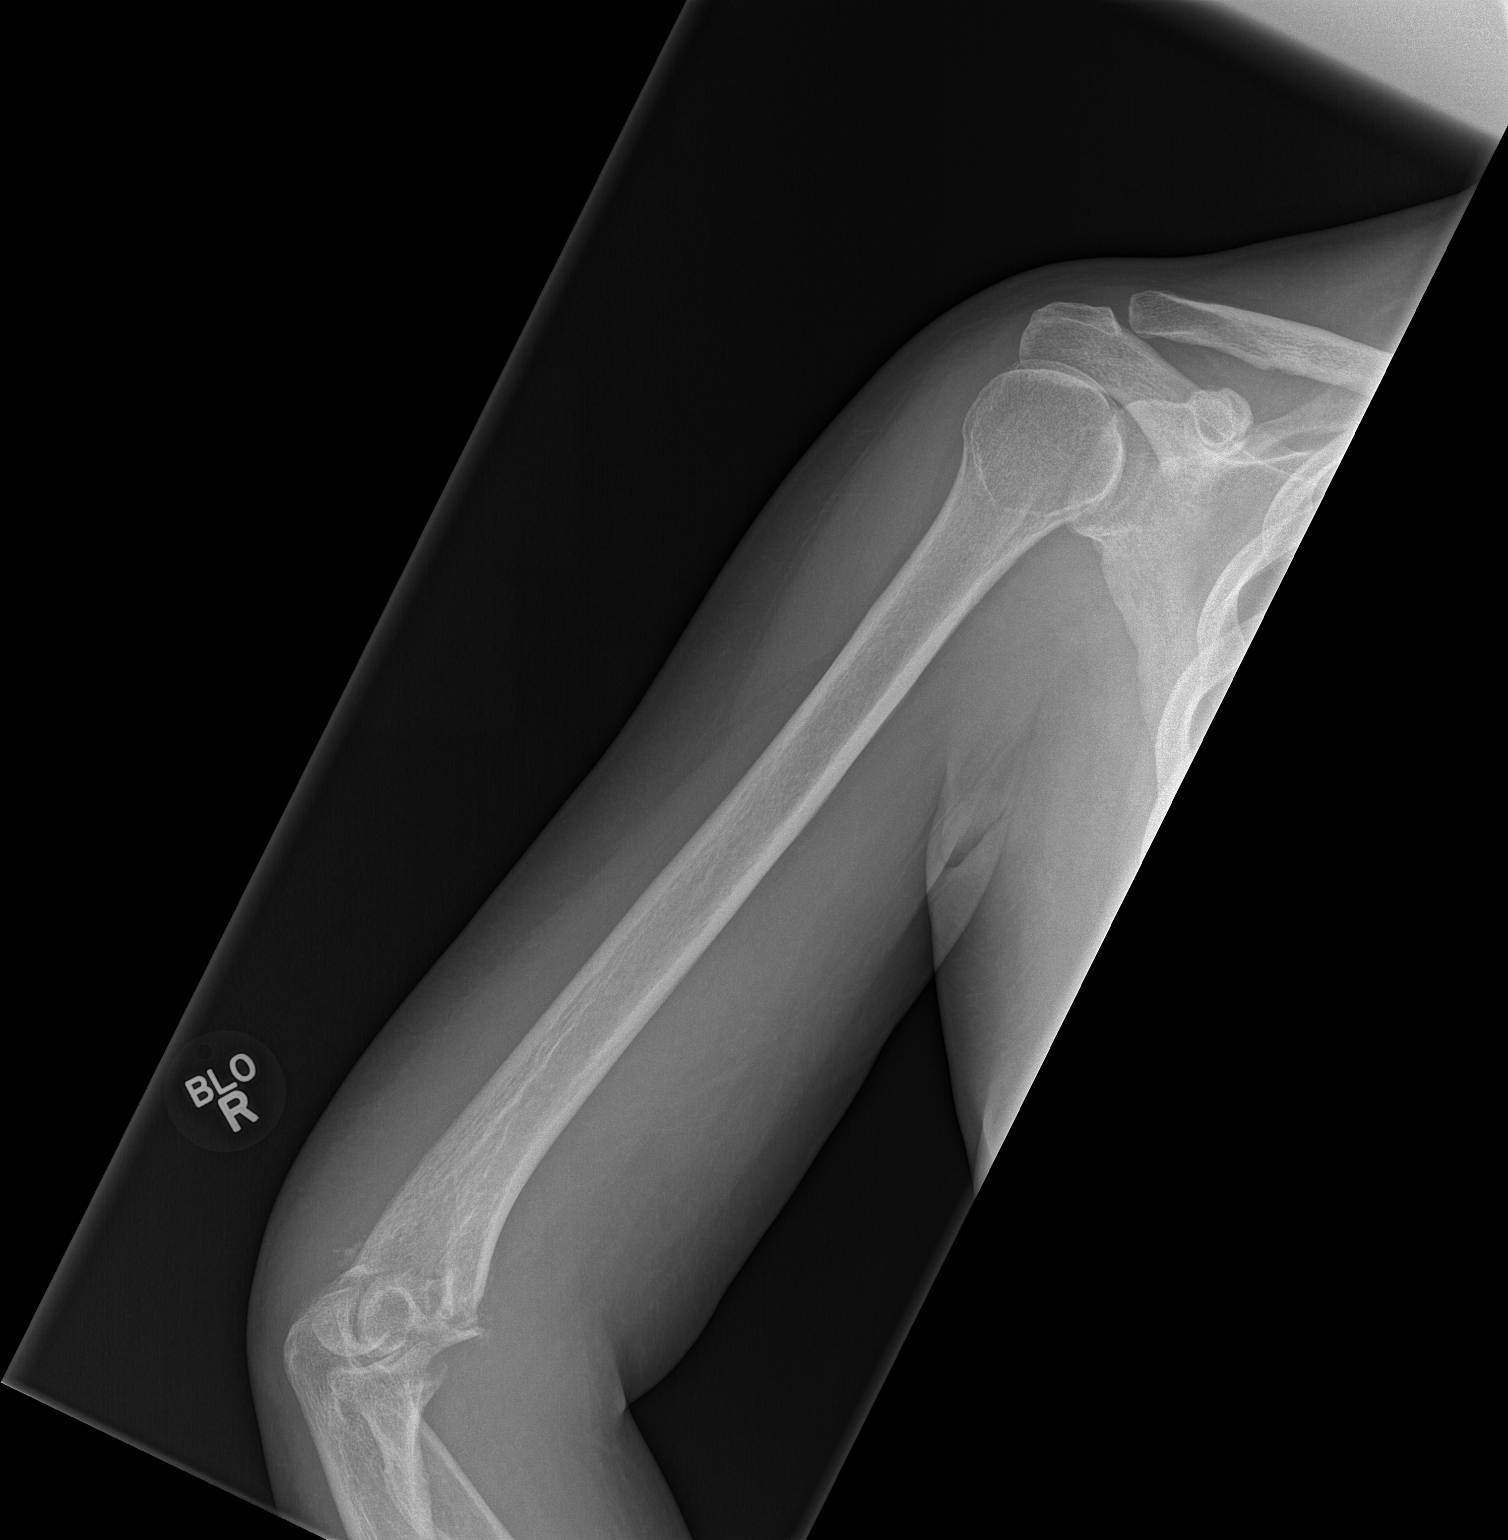

[2 of 2 positions shown; findings below may reference images not displayed]

FINDINGS: There is a minimally impacted transverse right humeral supracondylar
fracture with mild apex volar angulation of the fracture fragments.
Overlying soft tissue swelling is evident.
IMPRESSION: Acute right humeral supracondylar fracture.

## 2015-07-21 IMAGING — CR DG CHEST 2V
2 series · 2 of 2 positions shown · non-contrast
Comparison: 02/22/2014

CLINICAL DATA: Wound check left foot. Right humerus fracture.
Smoker.

EXAM:
CHEST  2 VIEW

[w chest pa]
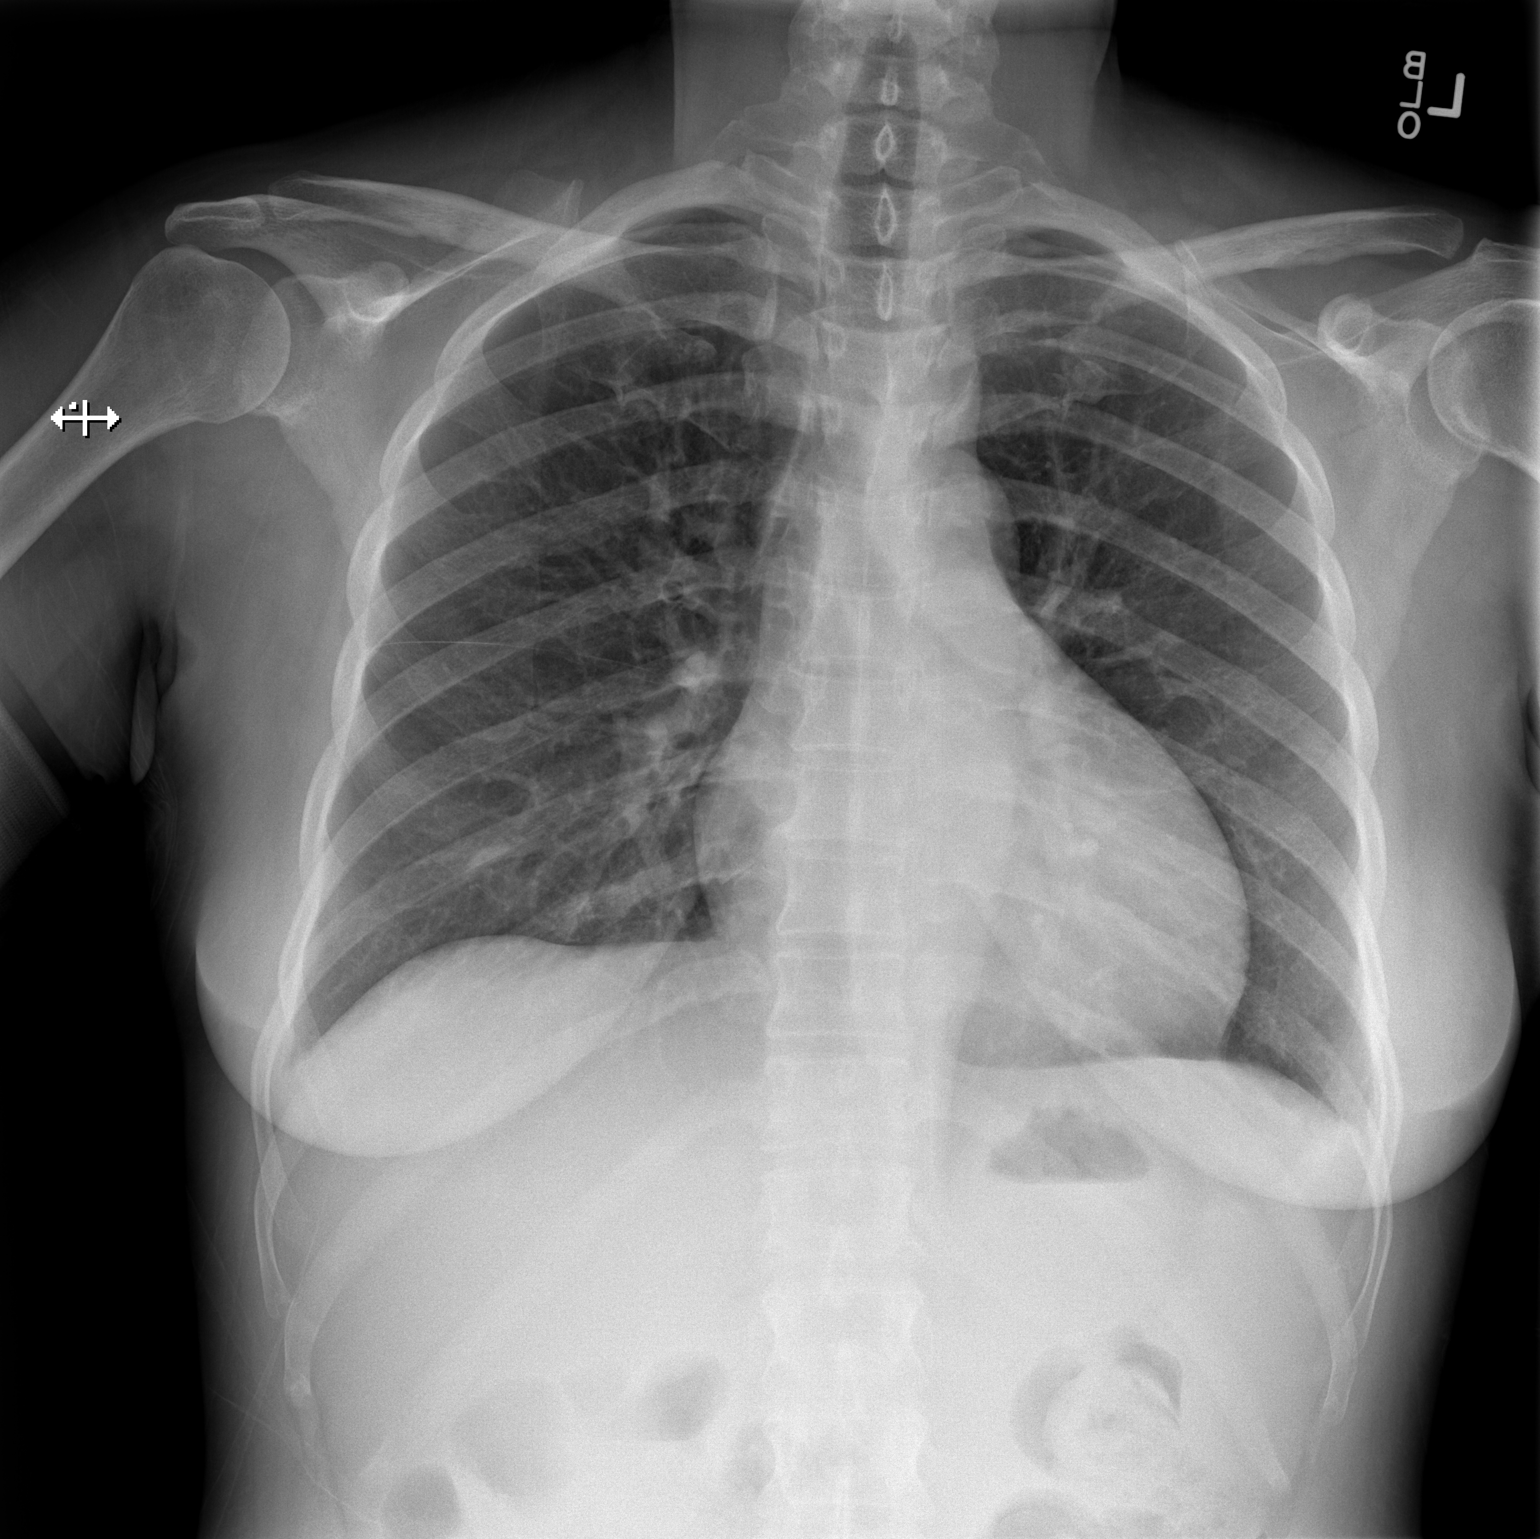

[w chest lat]
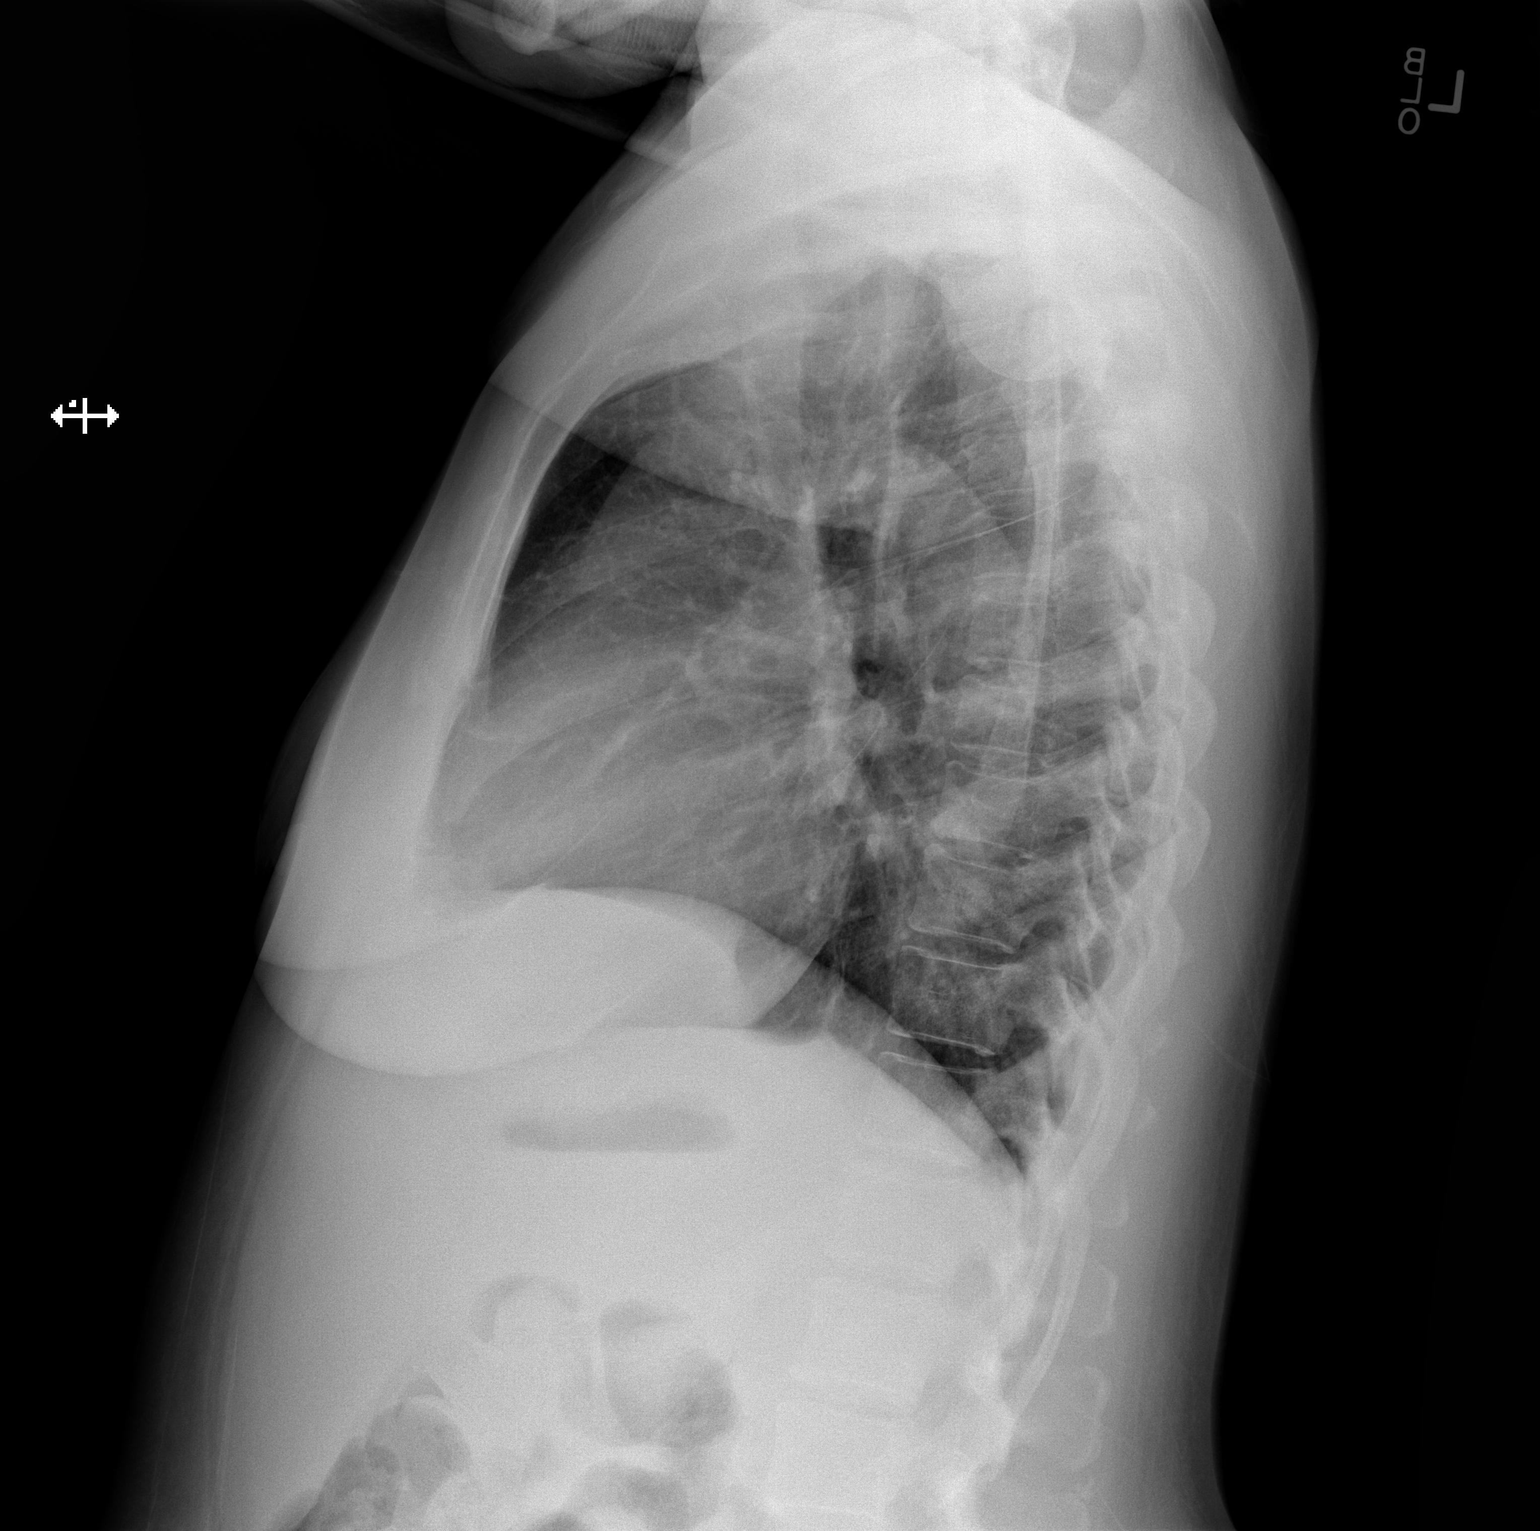

[2 of 2 positions shown; findings below may reference images not displayed]

FINDINGS: The heart size and mediastinal contours are within normal limits.
Both lungs are clear. The visualized skeletal structures are
unremarkable.
IMPRESSION: No active cardiopulmonary disease.

## 2015-07-21 IMAGING — CR DG ELBOW COMPLETE 3+V*R*
4 series · 4 of 4 positions shown · non-contrast
Comparison: Humerus radiographs same date, elbow radiographs
03/07/2014

CLINICAL DATA: Right elbow pain, fall

EXAM:
RIGHT ELBOW - COMPLETE 3+ VIEW

[x elbow ap right]
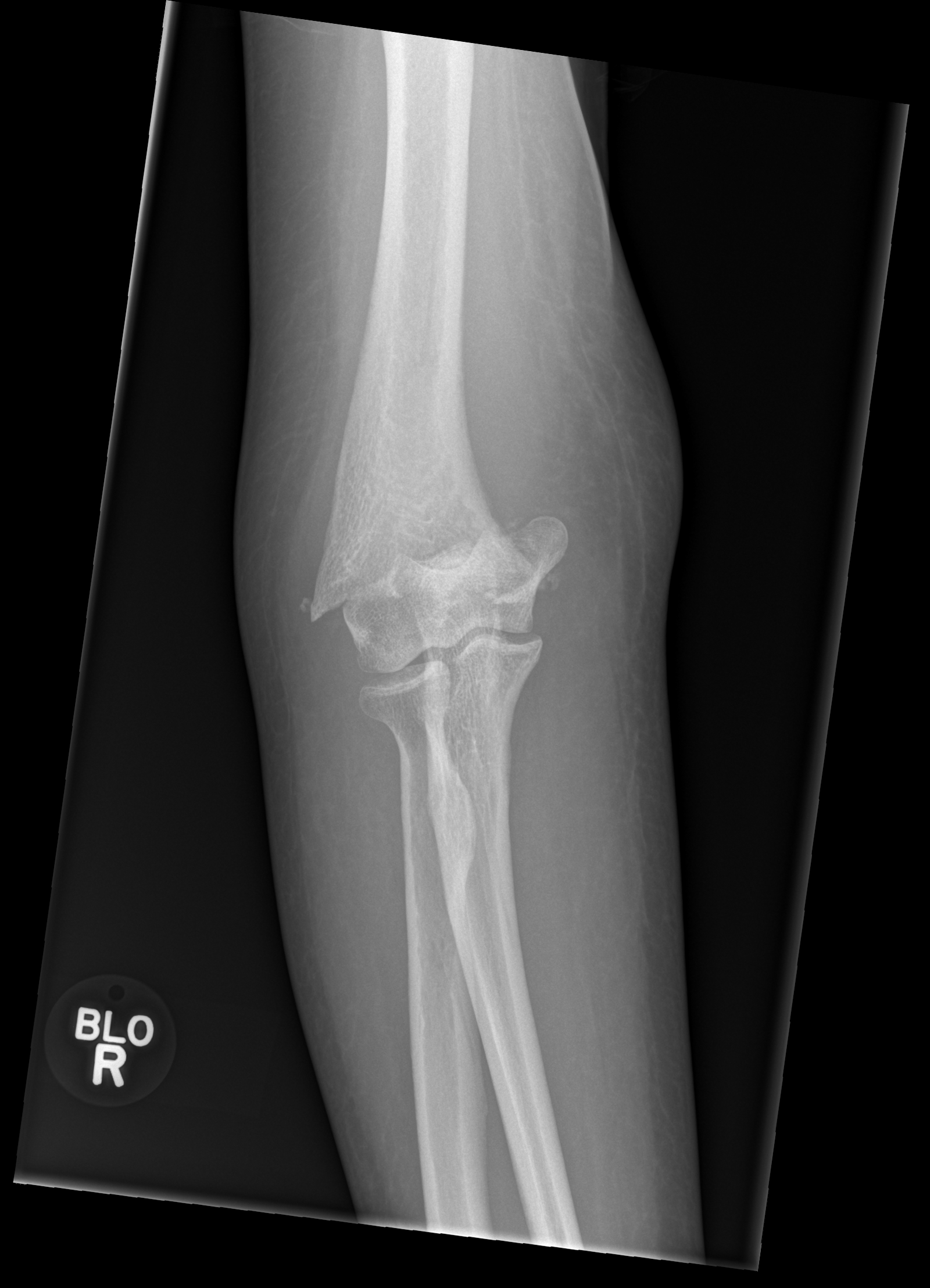

[x elbow obl right (1 of 2)]
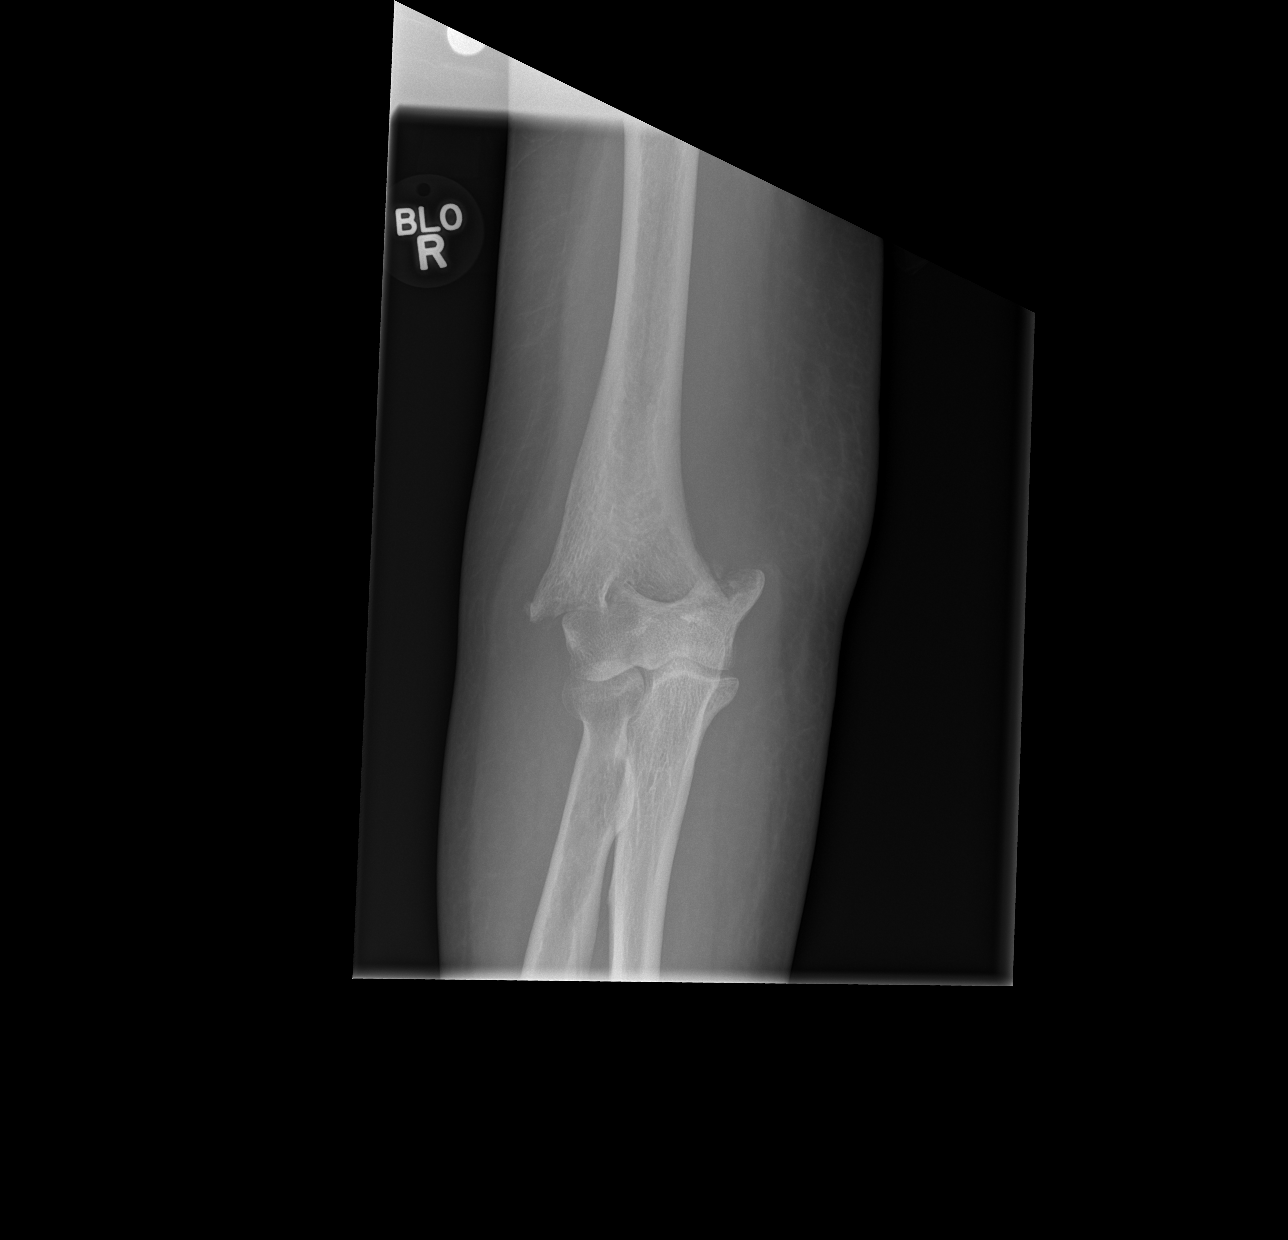

[x elbow obl right (2 of 2)]
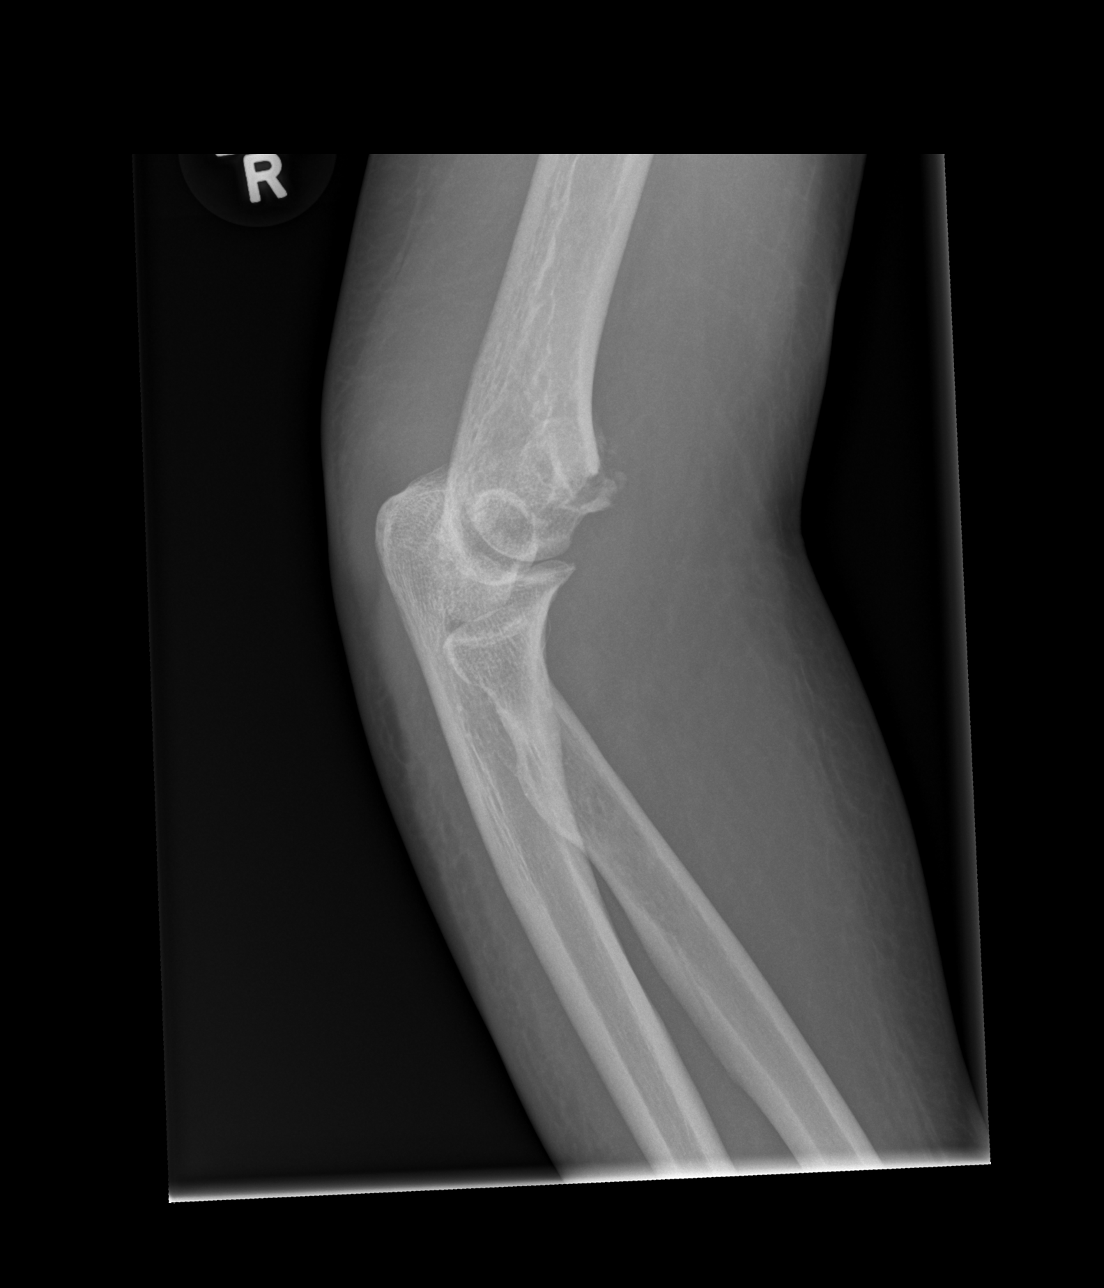

[x elbow lat right]
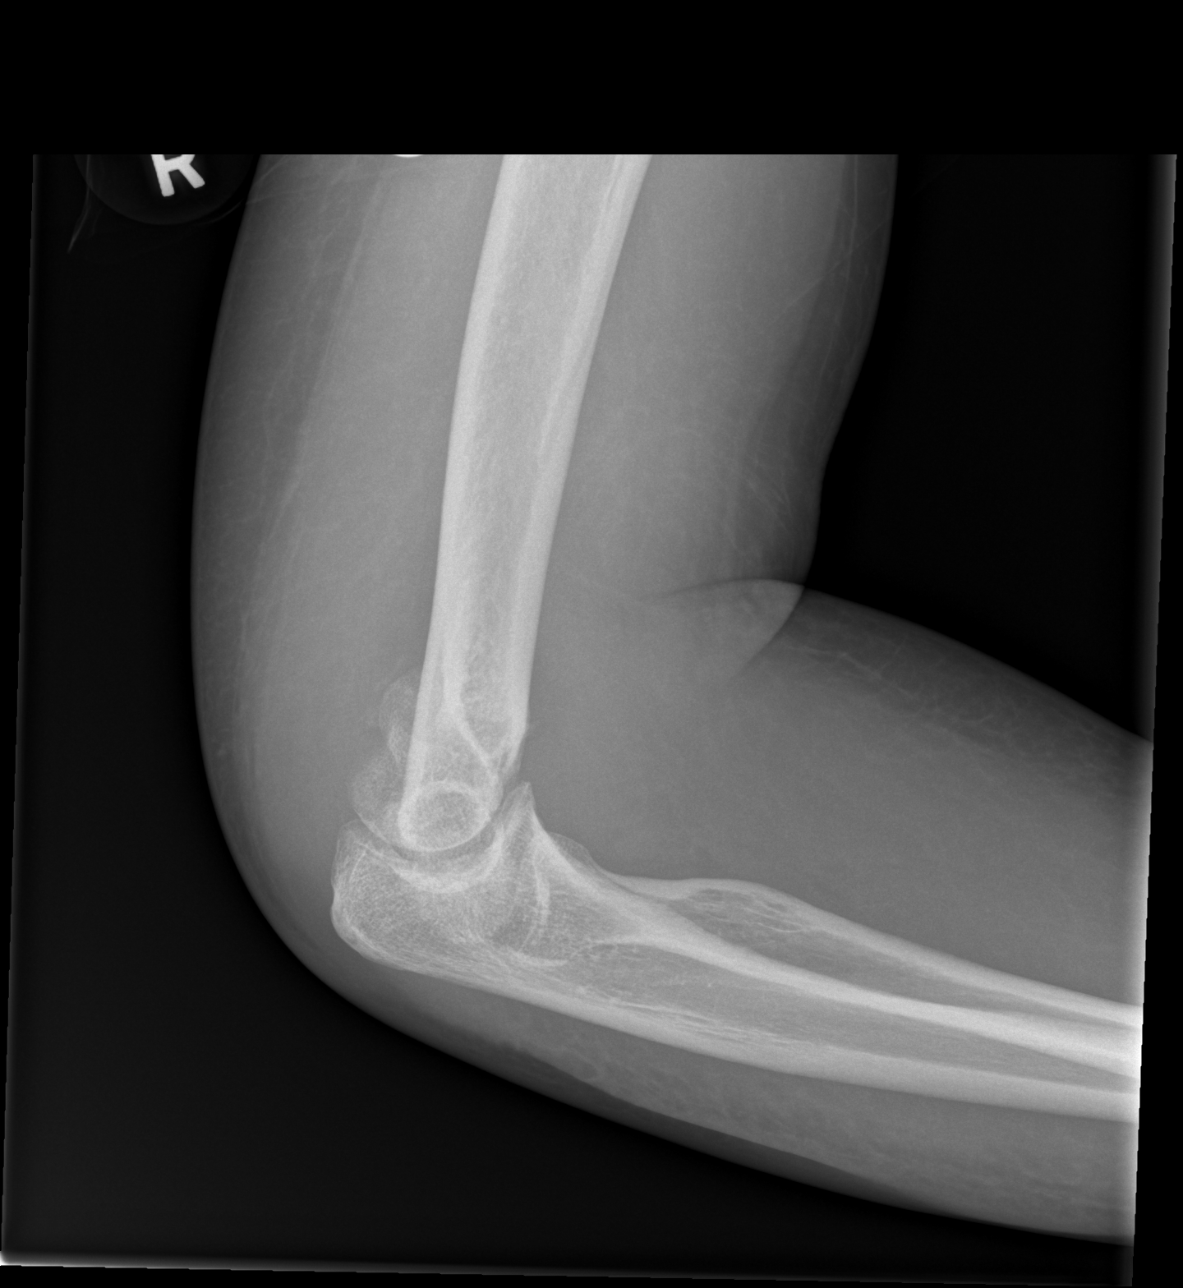

[4 of 4 positions shown; findings below may reference images not displayed]

FINDINGS: Right supracondylar humeral fracture is reidentified with mild
impaction and mild apex volar angulation of the fracture fragments.
IMPRESSION: Right humeral supracondylar fracture reidentified.

## 2015-07-23 IMAGING — RF DG ESOPHAGUS
9 of 17 series · 12 of 24 positions shown · non-contrast
Comparison: None.

CLINICAL DATA: Liquid and solid dysphagia for several months.

EXAM:
ESOPHOGRAM / BARIUM SWALLOW / BARIUM TABLET STUDY
TECHNIQUE: Combined double contrast and single contrast examination performed
using effervescent crystals, thick barium liquid, and thin barium
liquid. The patient was observed with fluoroscopy swallowing a 13mm
barium sulphate tablet.
FLUOROSCOPY TIME:  1 min 48 seconds

[Series 2: run · 1 of 1 slices shown (1 of 9)]
[im 1/1]
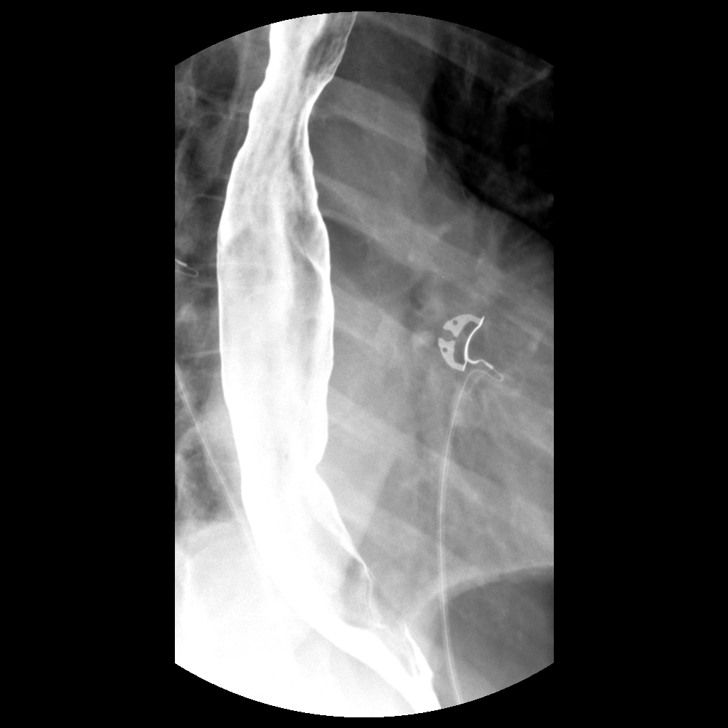

[Series 4: run · 1 of 1 slices shown (2 of 9)]
[im 1/1]
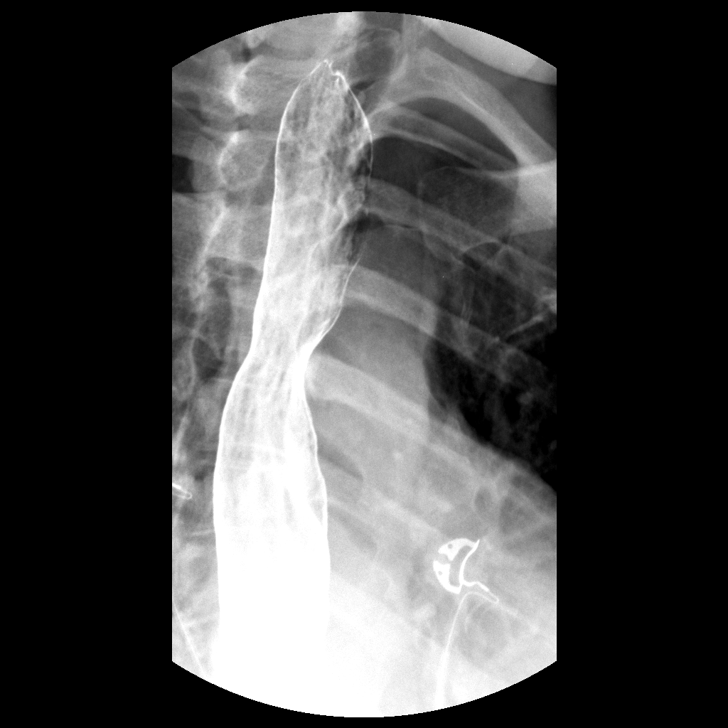

[Series 6: run · 1 of 1 slices shown (3 of 9)]
[im 1/1]
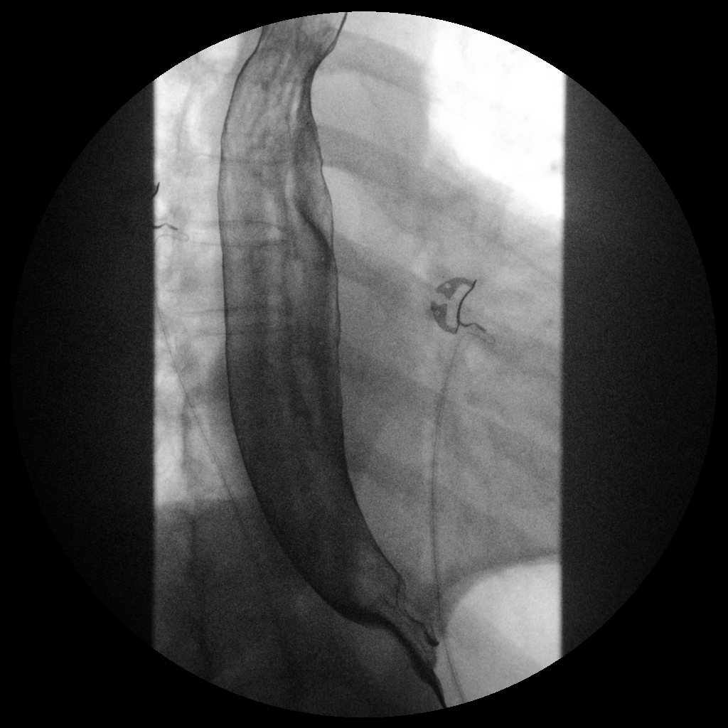

[Series 8: run · 1 of 1 slices shown (4 of 9)]
[im 1/1]
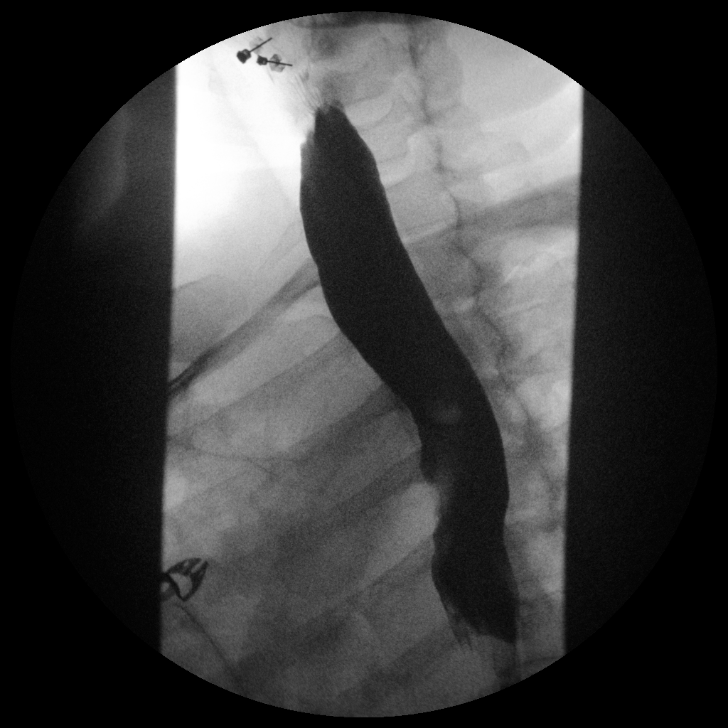

[Series 10: run · 1 of 1 slices shown (5 of 9)]
[im 1/1]
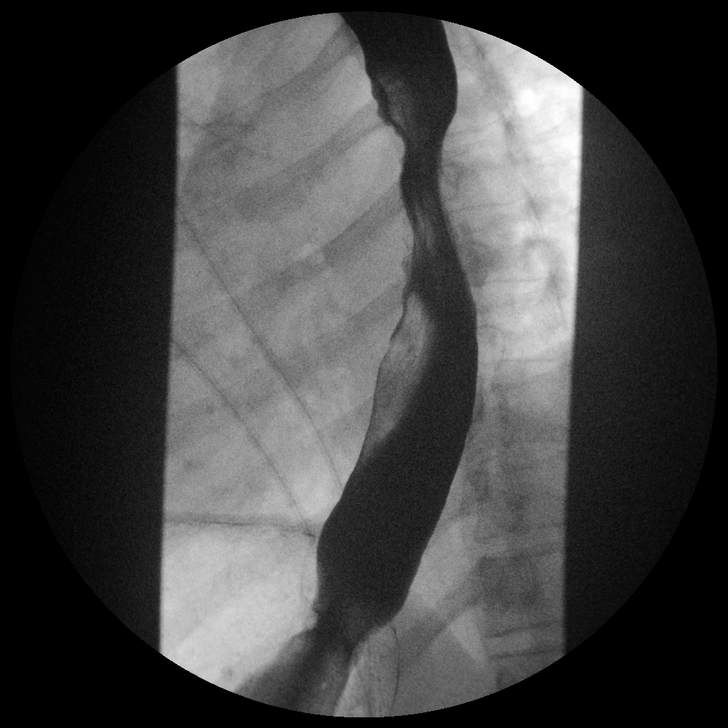

[Series 12: run · 1 of 1 slices shown (6 of 9)]
[im 1/1]
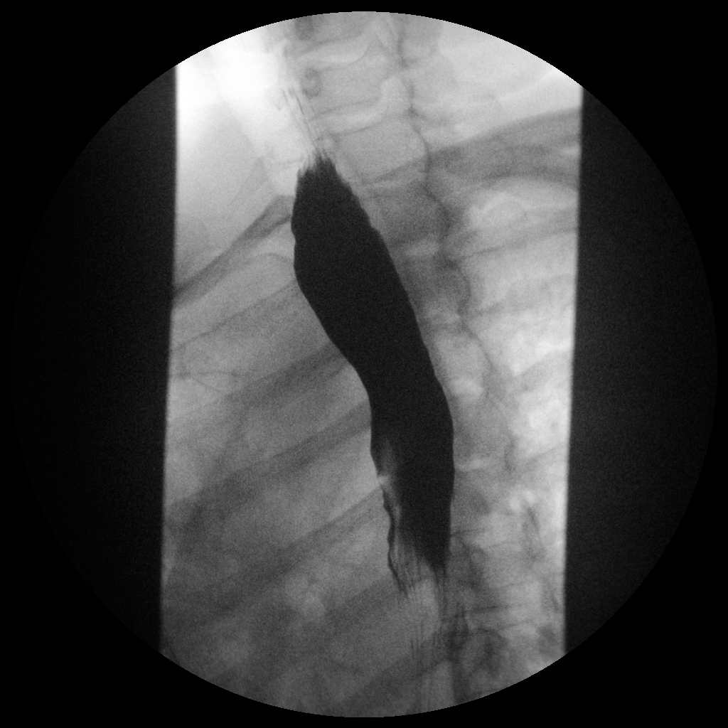

[Series 14: run · 1 of 1 slices shown (7 of 9)]
[im 1/1]
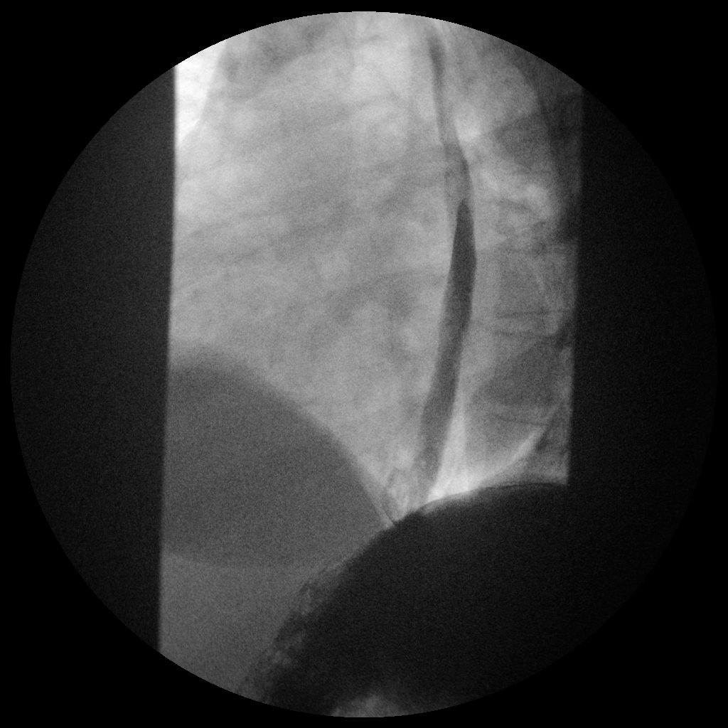

[Series 16: run · 2 of 9 slices shown (8 of 9)]
[im 1/9]
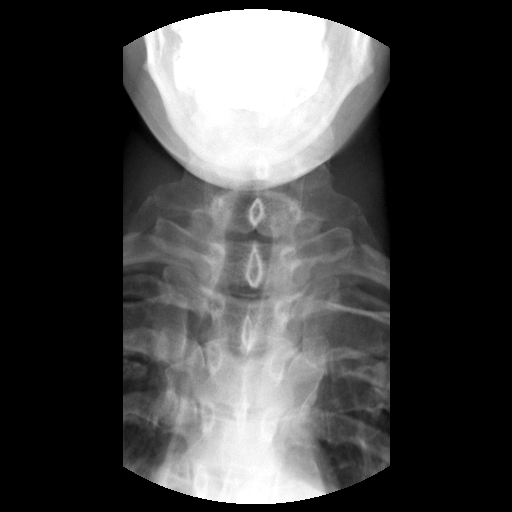
[im 9/9]
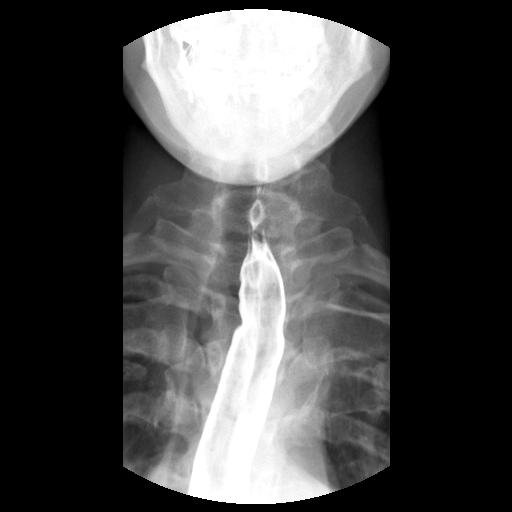

[Series 17: run · 3 of 11 slices shown (9 of 9)]
[im 3/11]
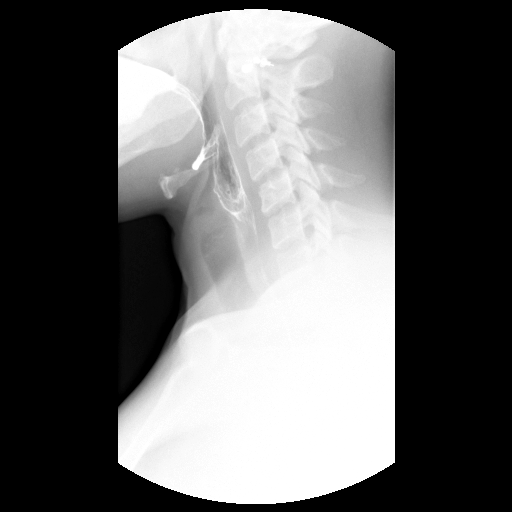
[im 7/11]
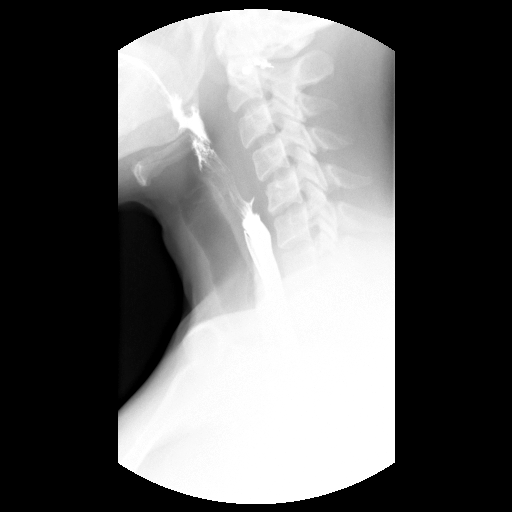
[im 11/11]
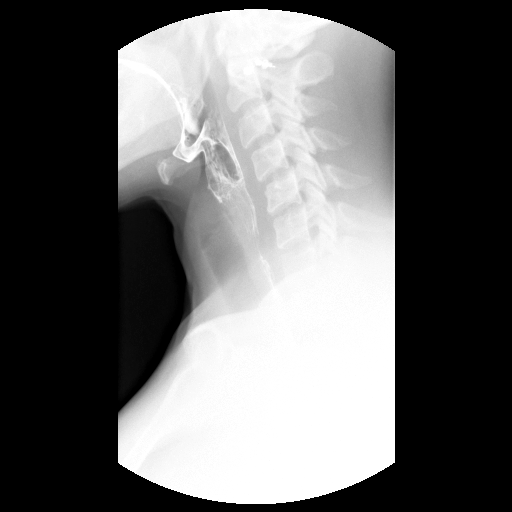

[12 of 24 positions shown; findings below may reference images not displayed]

FINDINGS: No gross mucosal or submucosal lesion is identified during
double-contrast examination of the thoracic esophagus. Evaluation of
esophageal motility on single swallows demonstrated intermittent
proximal release of the contrast bolus and secondary contrast stasis
in the upper thoracic esophagus. No tertiary contractions were seen.

Thoracic esophagus was normal in contour without evidence of
stricture or mass. No hiatal hernia was seen. Prominent
gastroesophageal reflux was seen to the level of the carina during
the water siphon test. Rapid sequence imaging of the cervical
esophagus was unremarkable. A barium tablet passed without delay.
IMPRESSION: 1. No esophageal stricture.
2. Mild, nonspecific esophageal dysmotility.
3. Gastroesophageal reflux during the water siphon test.

## 2015-08-01 IMAGING — CR DG CHEST 2V
2 series · 2 of 2 positions shown · non-contrast
Comparison: 04/11/2014.

CLINICAL DATA: Dyspnea on exertion.

EXAM:
CHEST  2 VIEW

[w chest pa]
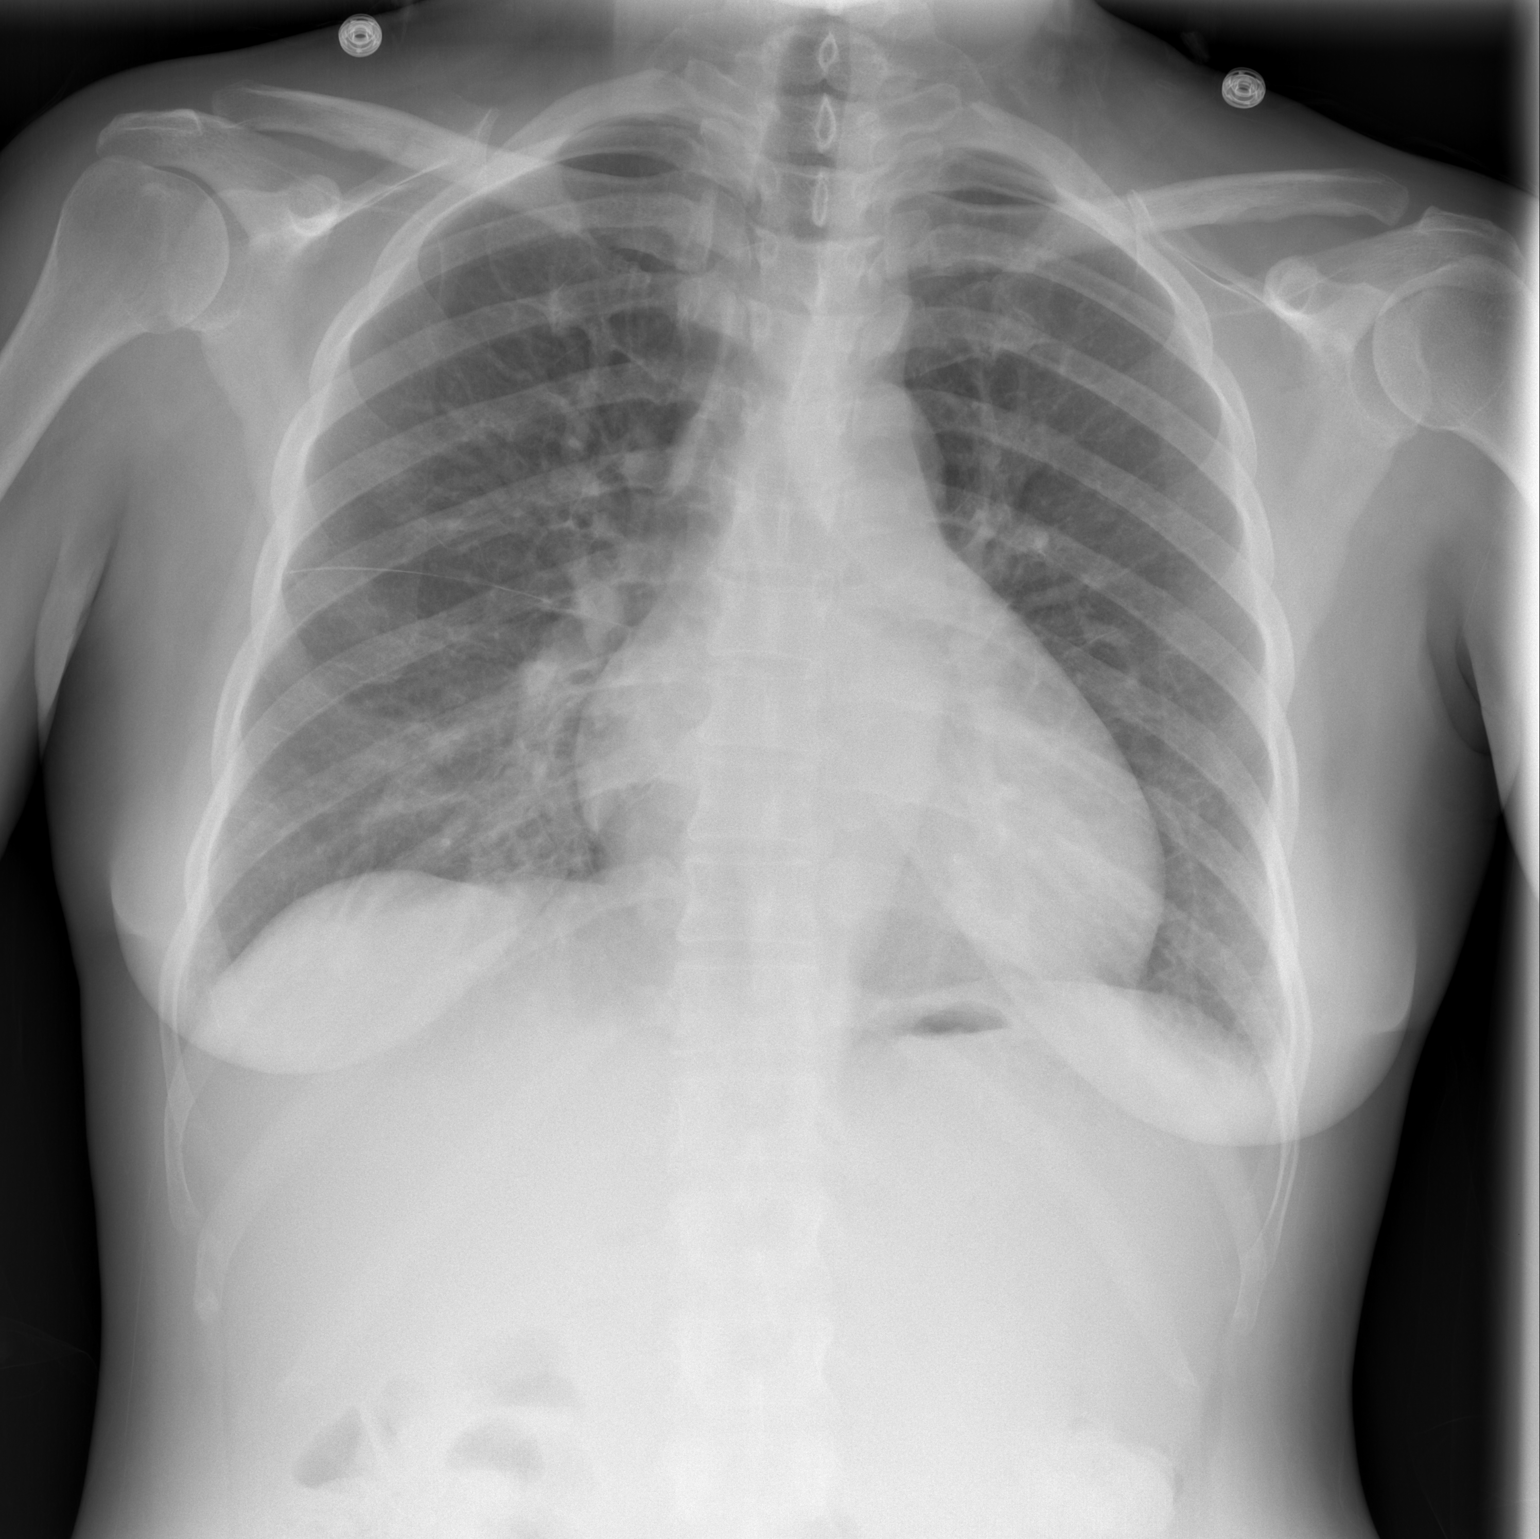

[w chest lat]
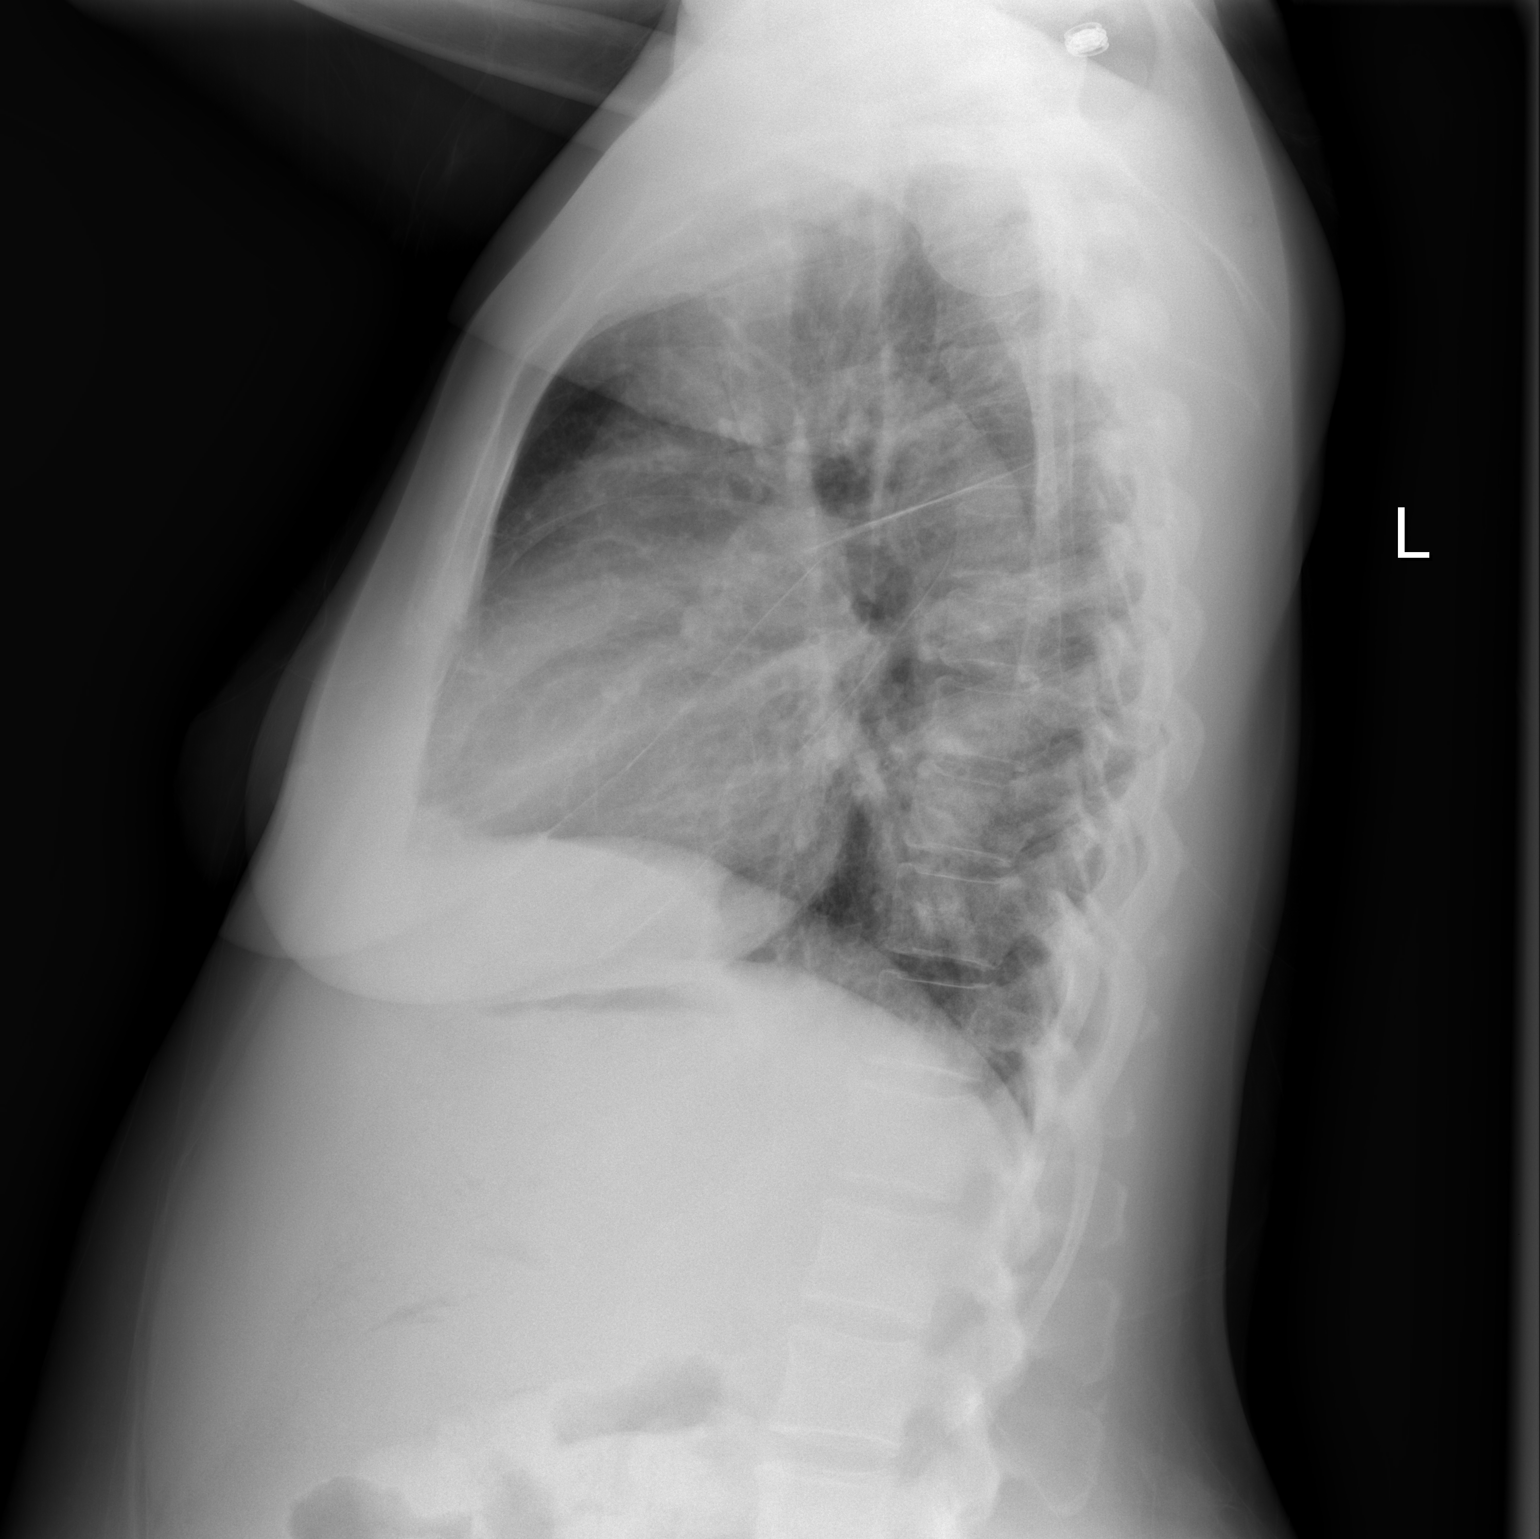

[2 of 2 positions shown; findings below may reference images not displayed]

FINDINGS: Cardiac silhouette is mildly enlarged. Aorta is mildly uncoiled. No
mediastinal or hilar masses. No evidence of adenopathy

Clear lungs.  No pleural effusion or pneumothorax.

Bony thorax is intact.
IMPRESSION: No acute cardiopulmonary disease. No change from the recent prior
study.

## 2015-08-01 IMAGING — CR DG HUMERUS 2V *R*
2 series · 2 of 2 positions shown · non-contrast
Comparison: [DATE] [DATE], [DATE] ; [DATE] [DATE], [DATE].

CLINICAL DATA: Distal humeral fracture.

EXAM:
RIGHT HUMERUS - 2+ VIEW

[w humerus ap right *]
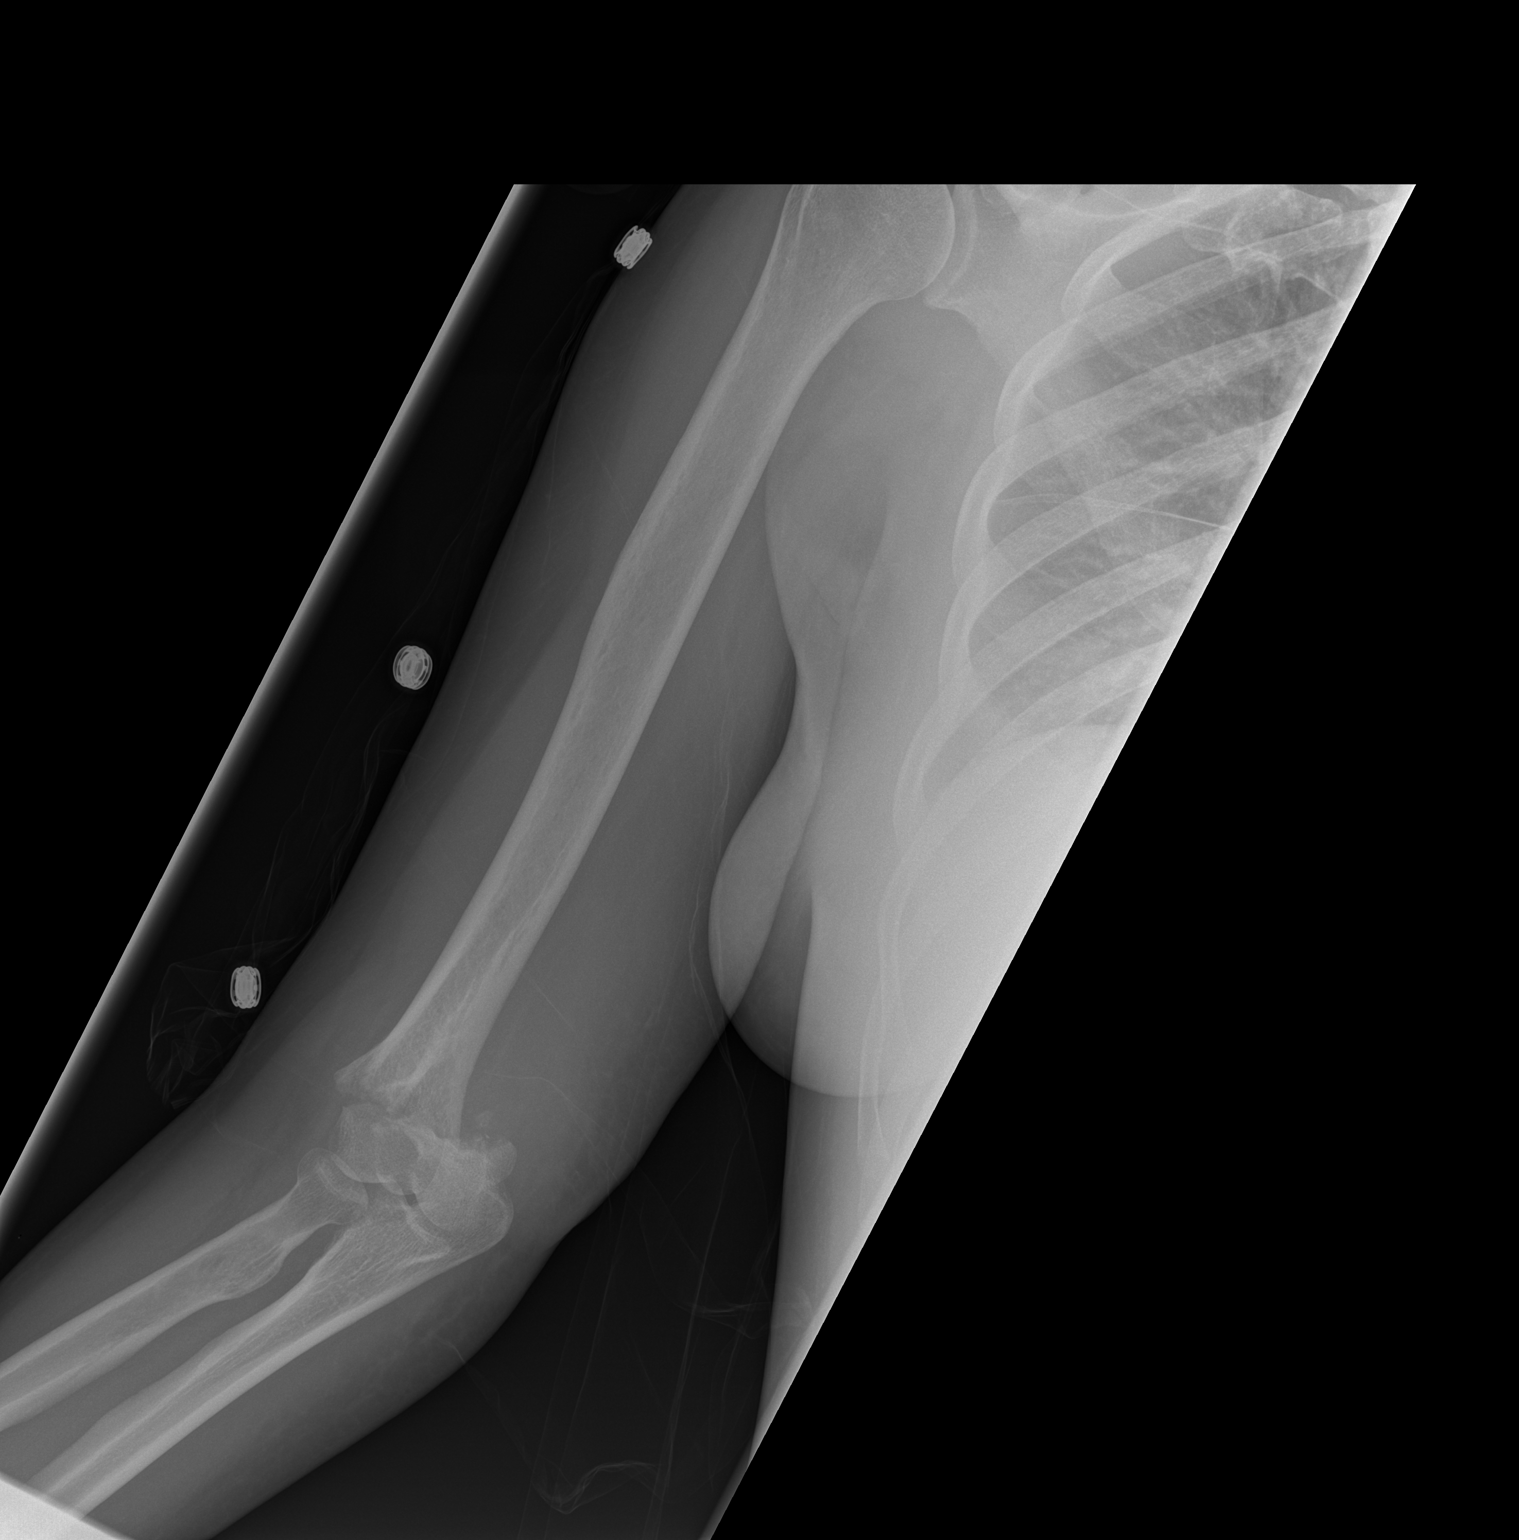

[w humerus lat right]
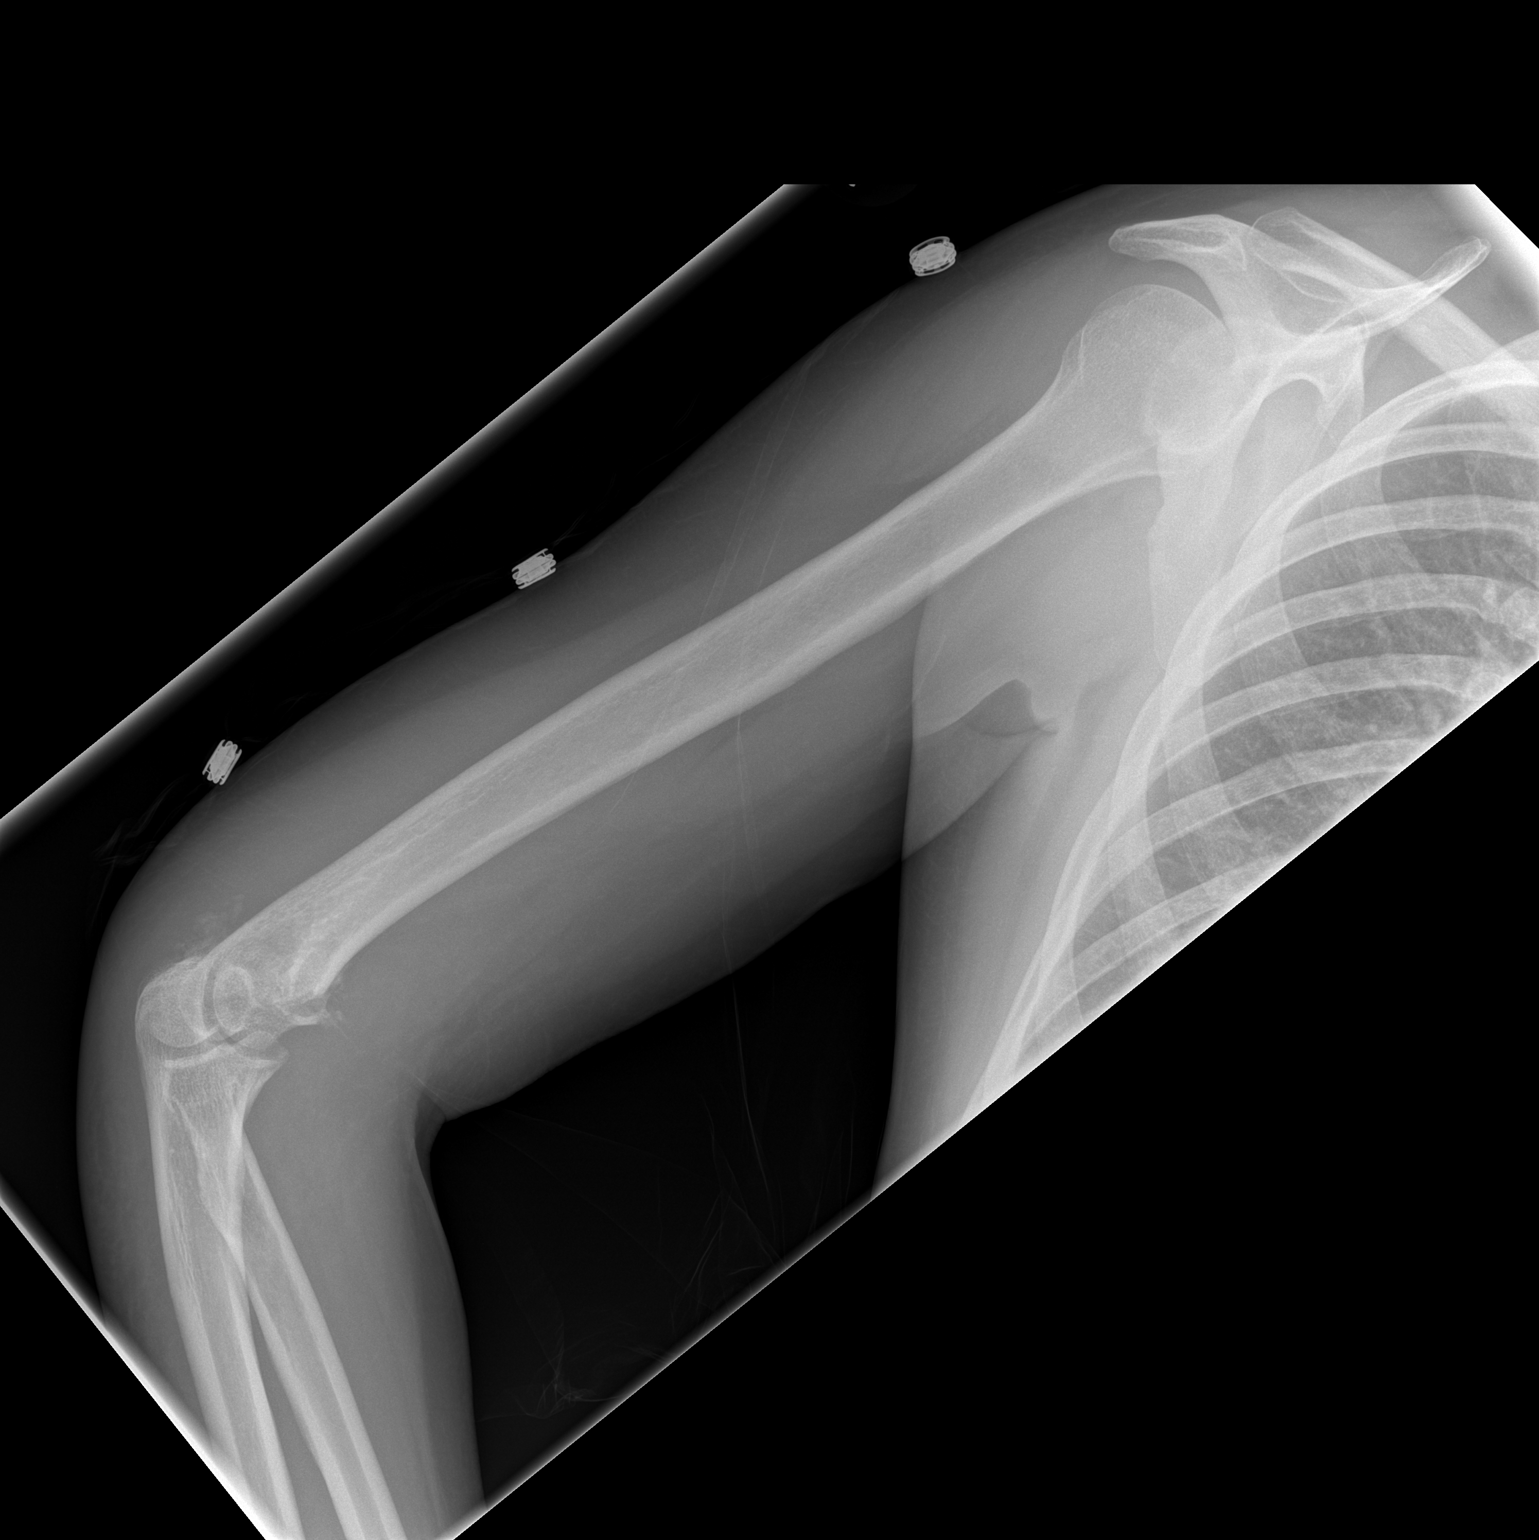

[2 of 2 positions shown; findings below may reference images not displayed]

FINDINGS: Continued presence of moderately displaced supracondylar fracture of
distal right humerus is noted. Some callus formation is noted, but
persistent nonunion remains.
IMPRESSION: Persistent nonunion of moderately displaced distal right
supracondylar humeral fracture.

## 2015-08-01 IMAGING — CR DG FOOT COMPLETE 3+V*L*
3 series · 3 of 3 positions shown · non-contrast
Comparison: 04/11/2014

CLINICAL DATA: Distal left foot ulcer

EXAM:
LEFT FOOT - COMPLETE 3+ VIEW

[x foot ap left]
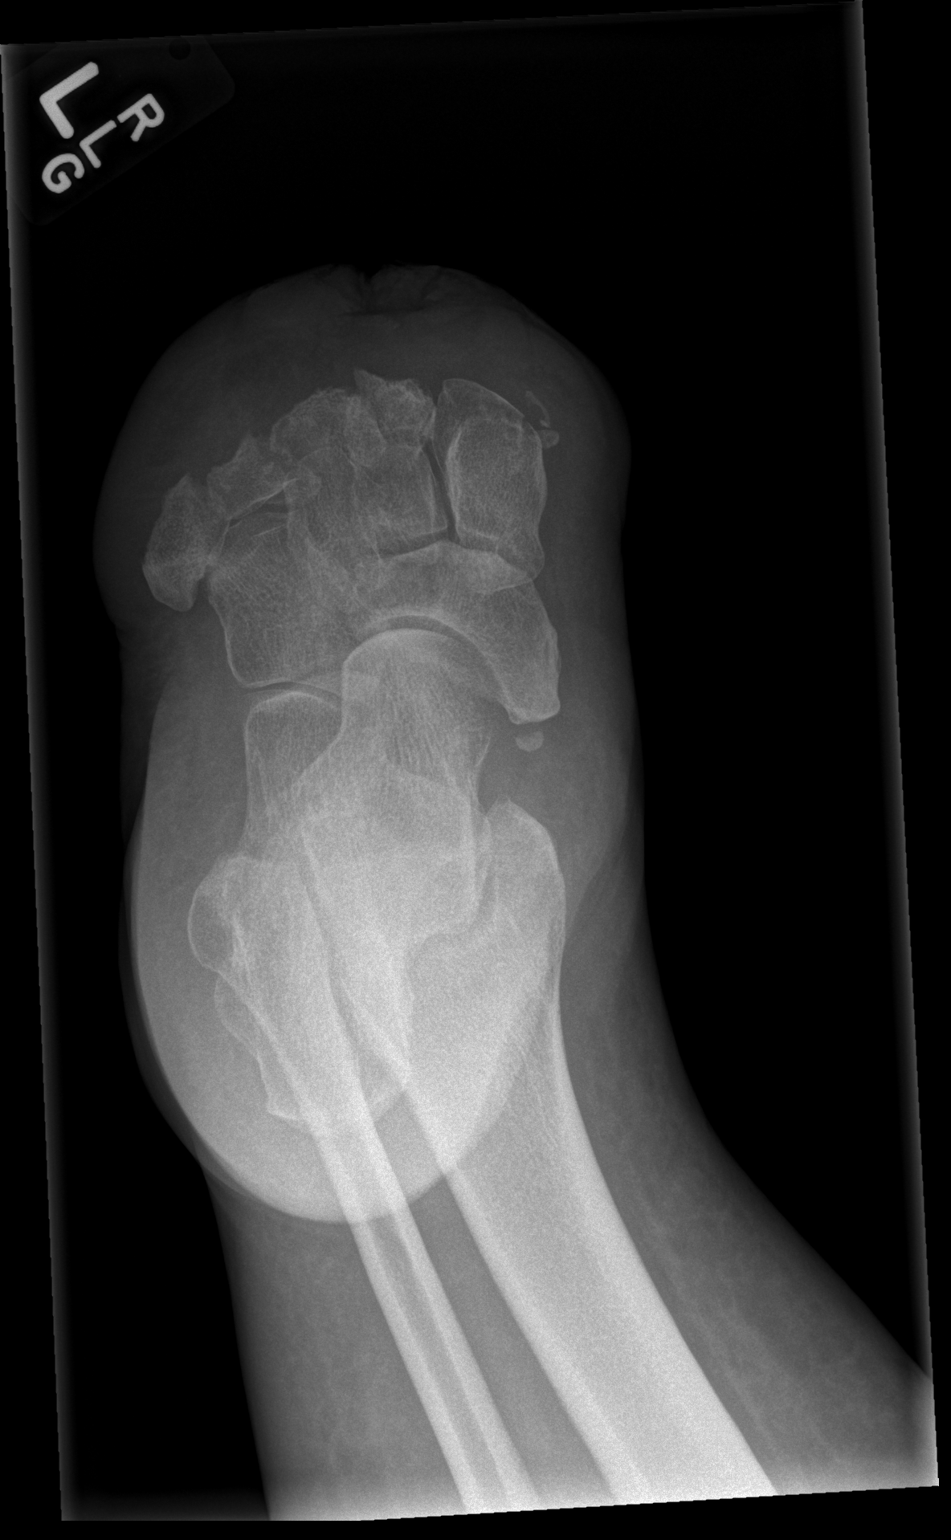

[x foot obl left]
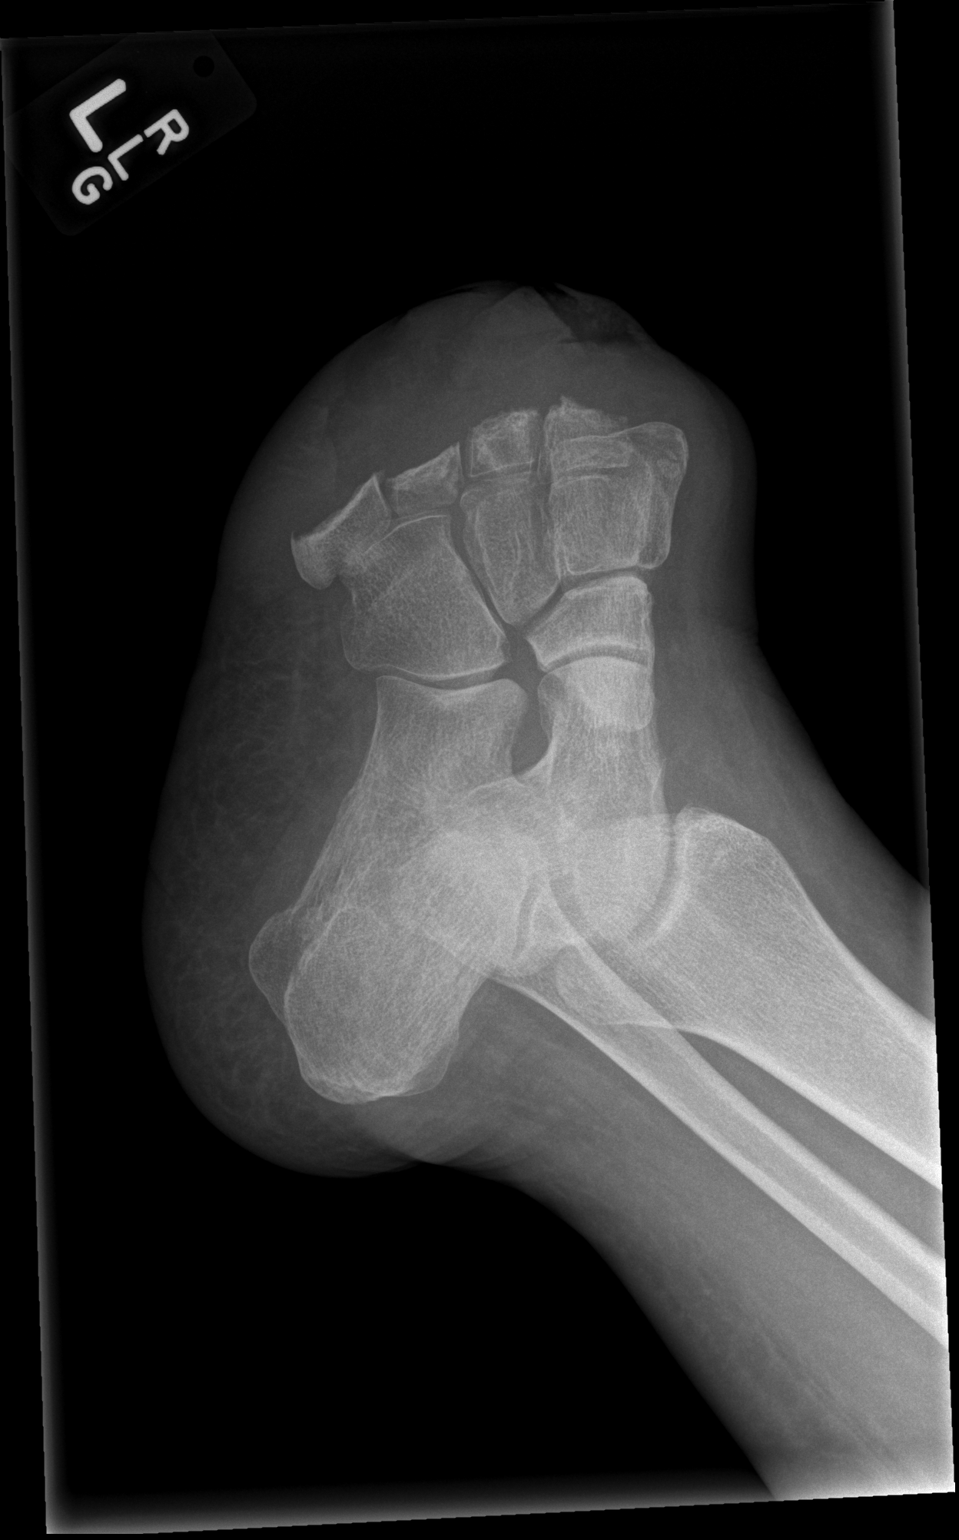

[x foot lat left]
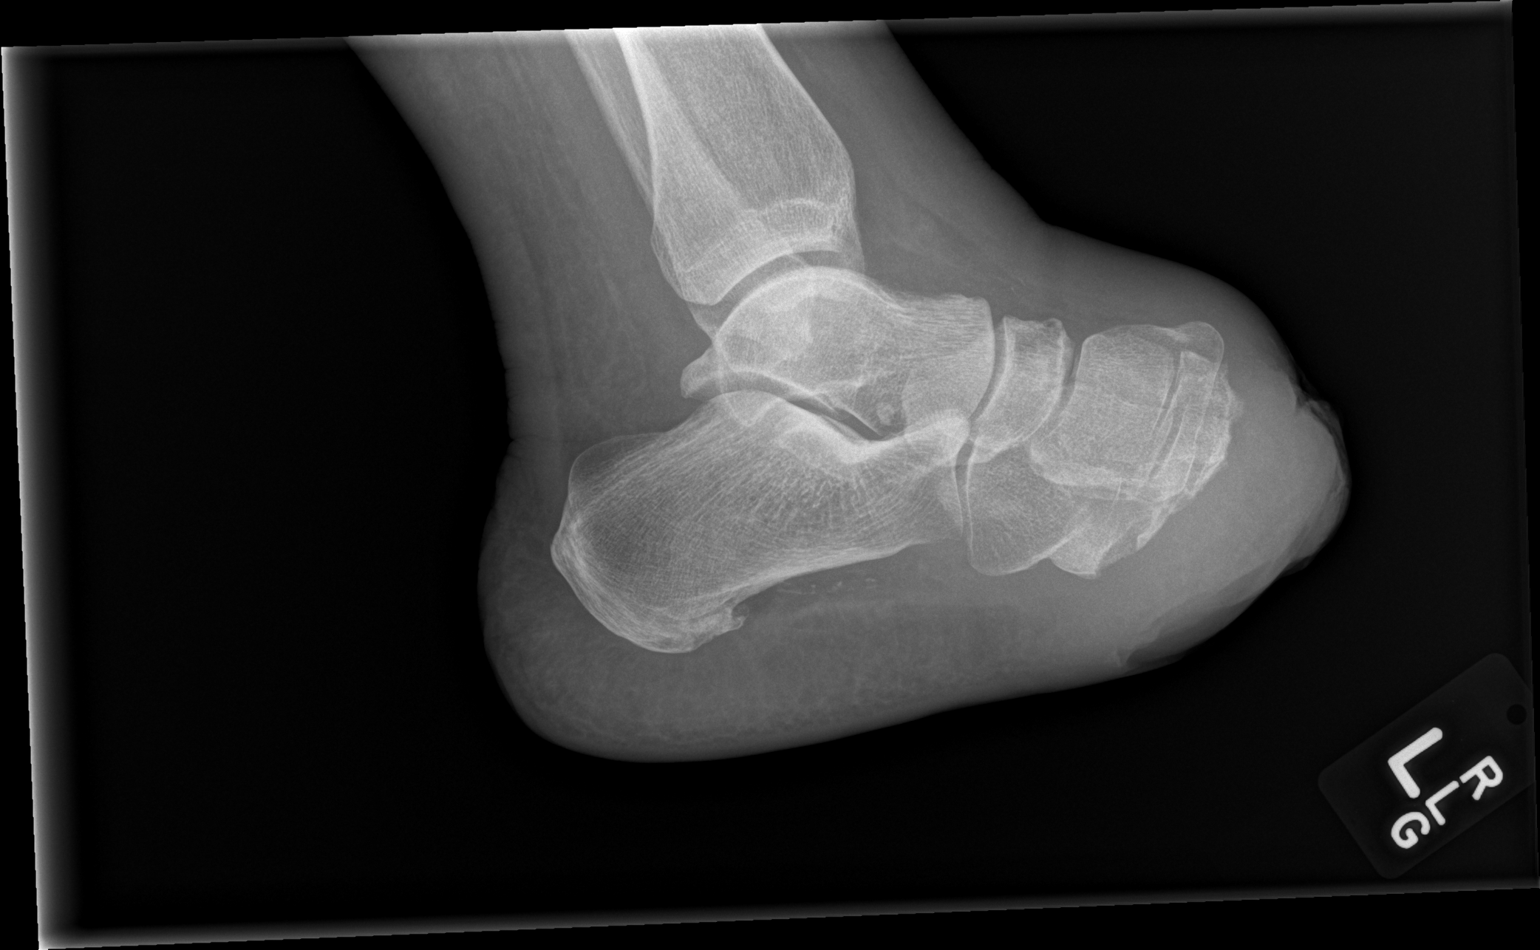

[3 of 3 positions shown; findings below may reference images not displayed]

FINDINGS: There again noted changes consistent with transmetatarsal
amputation. The overall appearance of the bony structures is stable
soft tissue defect is noted overlying the stump similar to that seen
on the prior exam. No definitive bony erosion is seen to suggest
osteomyelitis.
IMPRESSION: Postoperative changes as described.

Persistent soft tissue defect is noted distally without definitive
osteomyelitis.

## 2015-08-06 IMAGING — CR DG CHEST 2V
2 series · 2 of 2 positions shown · non-contrast
Comparison: 04/22/2014

CLINICAL DATA: Abnormal labs.  Hypertension and CHF.

EXAM:
CHEST  2 VIEW

[w chest pa]
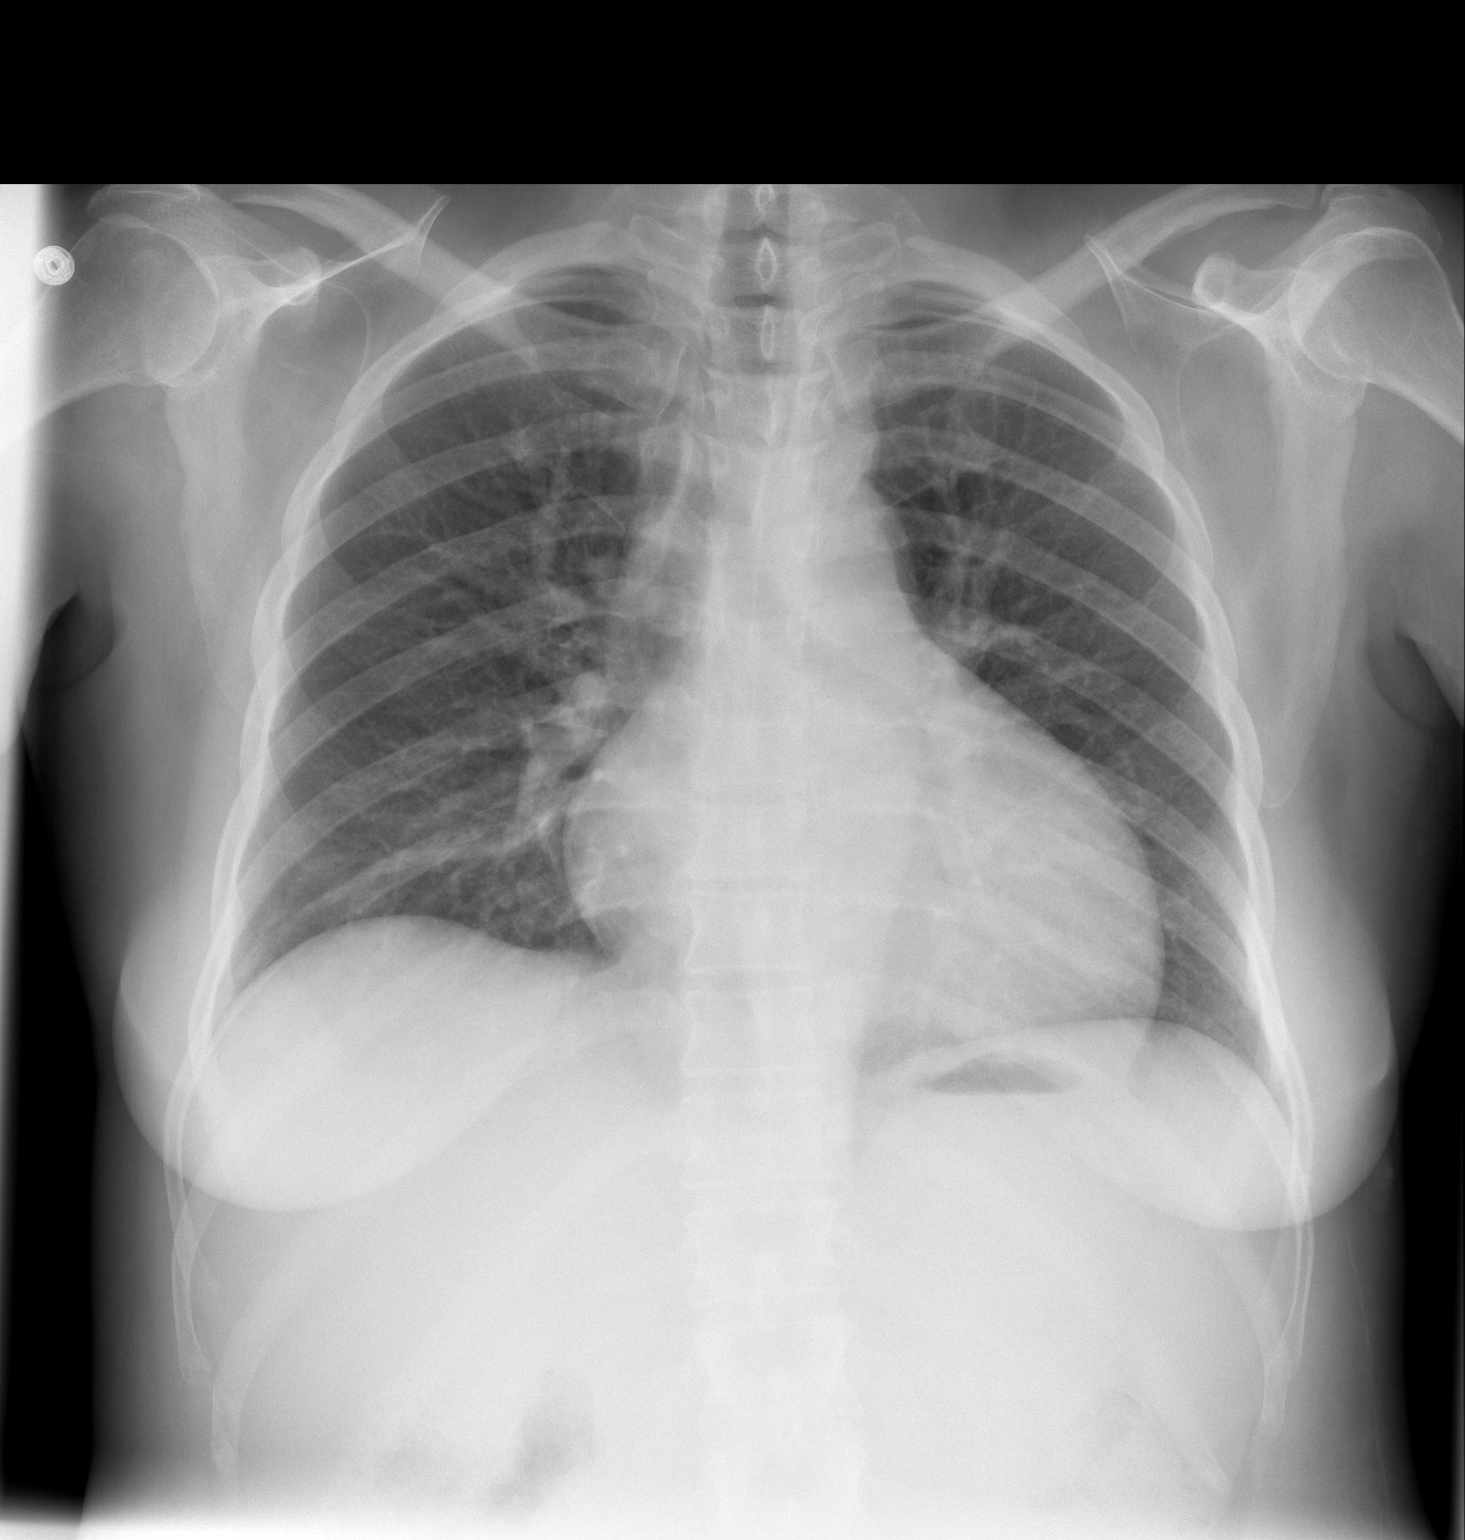

[w chest lat]
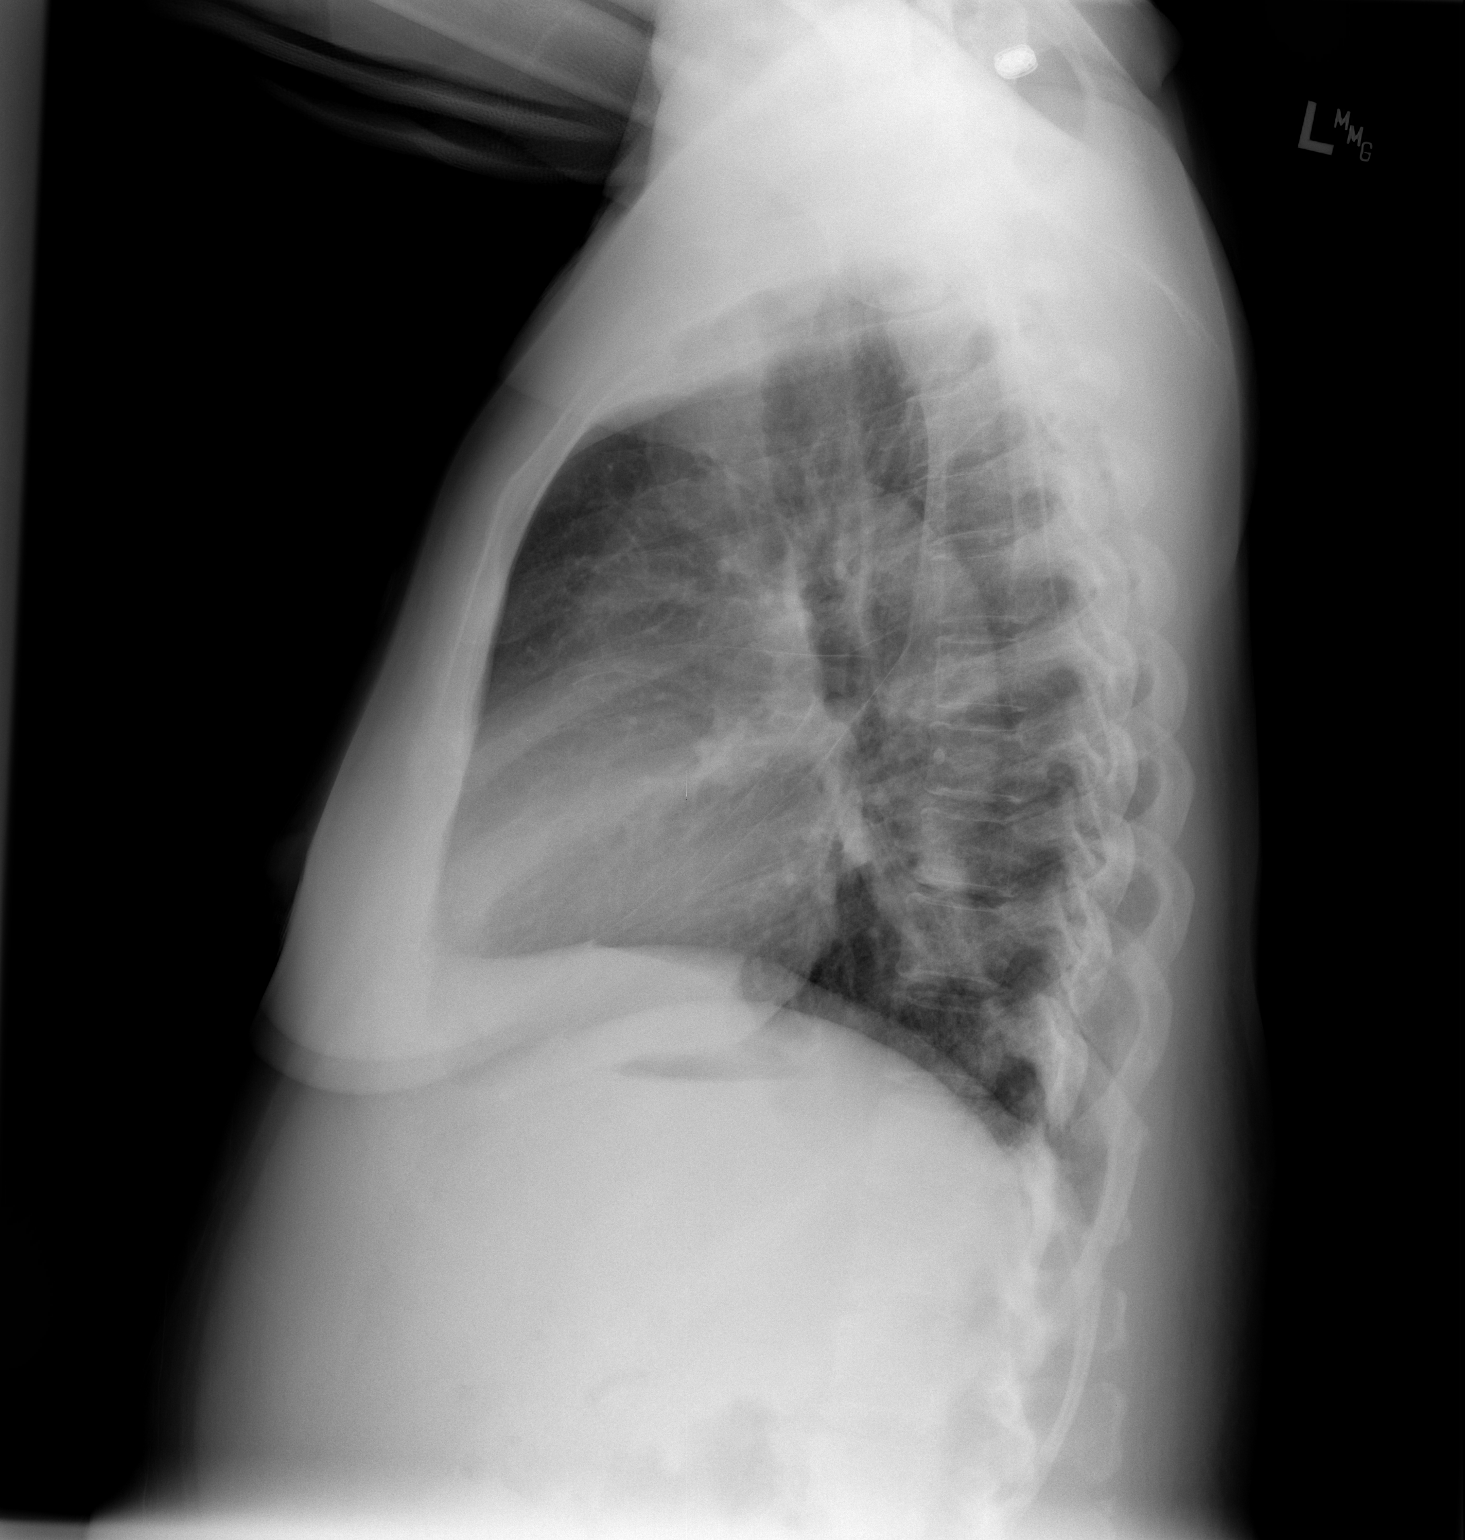

[2 of 2 positions shown; findings below may reference images not displayed]

FINDINGS: There is mild cardiomegaly which is stable from previous. Upper
mediastinal contours are negative. There is no edema, consolidation,
effusion, or pneumothorax.
IMPRESSION: 1. No active cardiopulmonary disease.
2. Mild cardiomegaly.

## 2015-09-16 DIAGNOSIS — G40909 Epilepsy, unspecified, not intractable, without status epilepticus: Secondary | ICD-10-CM | POA: Diagnosis present

## 2015-09-16 DIAGNOSIS — Z992 Dependence on renal dialysis: Secondary | ICD-10-CM

## 2015-09-16 DIAGNOSIS — N186 End stage renal disease: Secondary | ICD-10-CM | POA: Diagnosis present

## 2015-09-16 DIAGNOSIS — F1410 Cocaine abuse, uncomplicated: Secondary | ICD-10-CM | POA: Diagnosis present

## 2015-09-16 DIAGNOSIS — Z515 Encounter for palliative care: Secondary | ICD-10-CM | POA: Diagnosis present

## 2015-09-16 DIAGNOSIS — Z794 Long term (current) use of insulin: Secondary | ICD-10-CM

## 2015-09-16 DIAGNOSIS — Z79899 Other long term (current) drug therapy: Secondary | ICD-10-CM

## 2015-09-16 DIAGNOSIS — S42411A Displaced simple supracondylar fracture without intercondylar fracture of right humerus, initial encounter for closed fracture: Secondary | ICD-10-CM | POA: Diagnosis present

## 2015-09-16 DIAGNOSIS — W19XXXA Unspecified fall, initial encounter: Secondary | ICD-10-CM | POA: Diagnosis present

## 2015-09-16 DIAGNOSIS — Z89432 Acquired absence of left foot: Secondary | ICD-10-CM

## 2015-09-16 DIAGNOSIS — E7800 Pure hypercholesterolemia, unspecified: Secondary | ICD-10-CM | POA: Diagnosis present

## 2015-09-16 DIAGNOSIS — R627 Adult failure to thrive: Secondary | ICD-10-CM | POA: Diagnosis present

## 2015-09-16 DIAGNOSIS — Z8673 Personal history of transient ischemic attack (TIA), and cerebral infarction without residual deficits: Secondary | ICD-10-CM

## 2015-09-16 DIAGNOSIS — K219 Gastro-esophageal reflux disease without esophagitis: Secondary | ICD-10-CM | POA: Diagnosis present

## 2015-09-16 DIAGNOSIS — E11649 Type 2 diabetes mellitus with hypoglycemia without coma: Secondary | ICD-10-CM | POA: Diagnosis present

## 2015-09-16 DIAGNOSIS — F17210 Nicotine dependence, cigarettes, uncomplicated: Secondary | ICD-10-CM | POA: Diagnosis present

## 2015-09-16 DIAGNOSIS — H409 Unspecified glaucoma: Secondary | ICD-10-CM | POA: Diagnosis present

## 2015-09-16 DIAGNOSIS — K921 Melena: Secondary | ICD-10-CM | POA: Diagnosis not present

## 2015-09-16 DIAGNOSIS — I5042 Chronic combined systolic (congestive) and diastolic (congestive) heart failure: Secondary | ICD-10-CM | POA: Diagnosis present

## 2015-09-16 DIAGNOSIS — F319 Bipolar disorder, unspecified: Secondary | ICD-10-CM | POA: Diagnosis present

## 2015-09-16 DIAGNOSIS — D631 Anemia in chronic kidney disease: Secondary | ICD-10-CM | POA: Diagnosis present

## 2015-09-16 DIAGNOSIS — F259 Schizoaffective disorder, unspecified: Secondary | ICD-10-CM | POA: Diagnosis present

## 2015-09-16 DIAGNOSIS — Z9115 Patient's noncompliance with renal dialysis: Secondary | ICD-10-CM

## 2015-09-16 DIAGNOSIS — E1140 Type 2 diabetes mellitus with diabetic neuropathy, unspecified: Secondary | ICD-10-CM | POA: Diagnosis present

## 2015-09-16 DIAGNOSIS — Z8614 Personal history of Methicillin resistant Staphylococcus aureus infection: Secondary | ICD-10-CM

## 2015-09-16 DIAGNOSIS — E875 Hyperkalemia: Secondary | ICD-10-CM | POA: Diagnosis present

## 2015-09-16 DIAGNOSIS — R5381 Other malaise: Secondary | ICD-10-CM | POA: Diagnosis present

## 2015-09-16 DIAGNOSIS — F419 Anxiety disorder, unspecified: Secondary | ICD-10-CM | POA: Diagnosis present

## 2015-09-16 DIAGNOSIS — D62 Acute posthemorrhagic anemia: Secondary | ICD-10-CM | POA: Diagnosis present

## 2015-09-16 DIAGNOSIS — E1121 Type 2 diabetes mellitus with diabetic nephropathy: Secondary | ICD-10-CM | POA: Diagnosis present

## 2015-09-16 DIAGNOSIS — E1122 Type 2 diabetes mellitus with diabetic chronic kidney disease: Secondary | ICD-10-CM | POA: Diagnosis present

## 2015-09-16 DIAGNOSIS — I132 Hypertensive heart and chronic kidney disease with heart failure and with stage 5 chronic kidney disease, or end stage renal disease: Secondary | ICD-10-CM | POA: Diagnosis present

## 2015-09-16 DIAGNOSIS — G894 Chronic pain syndrome: Secondary | ICD-10-CM | POA: Diagnosis present

## 2015-09-16 DIAGNOSIS — Z79891 Long term (current) use of opiate analgesic: Secondary | ICD-10-CM

## 2015-09-16 DIAGNOSIS — B1920 Unspecified viral hepatitis C without hepatic coma: Secondary | ICD-10-CM | POA: Diagnosis present

## 2015-09-17 ENCOUNTER — Emergency Department (HOSPITAL_COMMUNITY): Payer: Medicare Other

## 2015-09-17 ENCOUNTER — Encounter (HOSPITAL_COMMUNITY): Payer: Self-pay | Admitting: Emergency Medicine

## 2015-09-17 ENCOUNTER — Inpatient Hospital Stay (HOSPITAL_COMMUNITY): Payer: Medicare Other

## 2015-09-17 ENCOUNTER — Inpatient Hospital Stay (HOSPITAL_COMMUNITY)
Admission: EM | Admit: 2015-09-17 | Discharge: 2015-09-20 | DRG: 377 | Disposition: A | Discharge status: Skilled Nursing Facility | Payer: Medicare Other | Attending: Internal Medicine | Admitting: Internal Medicine

## 2015-09-17 DIAGNOSIS — W19XXXA Unspecified fall, initial encounter: Secondary | ICD-10-CM

## 2015-09-17 DIAGNOSIS — F319 Bipolar disorder, unspecified: Secondary | ICD-10-CM | POA: Diagnosis present

## 2015-09-17 DIAGNOSIS — D649 Anemia, unspecified: Secondary | ICD-10-CM | POA: Insufficient documentation

## 2015-09-17 DIAGNOSIS — F419 Anxiety disorder, unspecified: Secondary | ICD-10-CM | POA: Diagnosis present

## 2015-09-17 DIAGNOSIS — R627 Adult failure to thrive: Secondary | ICD-10-CM | POA: Diagnosis present

## 2015-09-17 DIAGNOSIS — N189 Chronic kidney disease, unspecified: Secondary | ICD-10-CM

## 2015-09-17 DIAGNOSIS — N186 End stage renal disease: Secondary | ICD-10-CM | POA: Diagnosis present

## 2015-09-17 DIAGNOSIS — F259 Schizoaffective disorder, unspecified: Secondary | ICD-10-CM | POA: Diagnosis present

## 2015-09-17 DIAGNOSIS — Z8614 Personal history of Methicillin resistant Staphylococcus aureus infection: Secondary | ICD-10-CM | POA: Diagnosis not present

## 2015-09-17 DIAGNOSIS — D62 Acute posthemorrhagic anemia: Secondary | ICD-10-CM | POA: Diagnosis present

## 2015-09-17 DIAGNOSIS — Z89432 Acquired absence of left foot: Secondary | ICD-10-CM | POA: Diagnosis not present

## 2015-09-17 DIAGNOSIS — E119 Type 2 diabetes mellitus without complications: Secondary | ICD-10-CM

## 2015-09-17 DIAGNOSIS — Z8673 Personal history of transient ischemic attack (TIA), and cerebral infarction without residual deficits: Secondary | ICD-10-CM | POA: Diagnosis not present

## 2015-09-17 DIAGNOSIS — D631 Anemia in chronic kidney disease: Secondary | ICD-10-CM | POA: Diagnosis present

## 2015-09-17 DIAGNOSIS — N185 Chronic kidney disease, stage 5: Secondary | ICD-10-CM | POA: Diagnosis not present

## 2015-09-17 DIAGNOSIS — E1121 Type 2 diabetes mellitus with diabetic nephropathy: Secondary | ICD-10-CM

## 2015-09-17 DIAGNOSIS — S42411A Displaced simple supracondylar fracture without intercondylar fracture of right humerus, initial encounter for closed fracture: Secondary | ICD-10-CM | POA: Diagnosis present

## 2015-09-17 DIAGNOSIS — F329 Major depressive disorder, single episode, unspecified: Secondary | ICD-10-CM | POA: Diagnosis not present

## 2015-09-17 DIAGNOSIS — K922 Gastrointestinal hemorrhage, unspecified: Secondary | ICD-10-CM | POA: Diagnosis not present

## 2015-09-17 DIAGNOSIS — B1920 Unspecified viral hepatitis C without hepatic coma: Secondary | ICD-10-CM | POA: Diagnosis present

## 2015-09-17 DIAGNOSIS — Z79899 Other long term (current) drug therapy: Secondary | ICD-10-CM | POA: Diagnosis not present

## 2015-09-17 DIAGNOSIS — G894 Chronic pain syndrome: Secondary | ICD-10-CM | POA: Diagnosis present

## 2015-09-17 DIAGNOSIS — K921 Melena: Secondary | ICD-10-CM

## 2015-09-17 DIAGNOSIS — Z79891 Long term (current) use of opiate analgesic: Secondary | ICD-10-CM | POA: Diagnosis not present

## 2015-09-17 DIAGNOSIS — Z9115 Patient's noncompliance with renal dialysis: Secondary | ICD-10-CM | POA: Diagnosis not present

## 2015-09-17 DIAGNOSIS — Z7189 Other specified counseling: Secondary | ICD-10-CM | POA: Diagnosis not present

## 2015-09-17 DIAGNOSIS — Z515 Encounter for palliative care: Secondary | ICD-10-CM | POA: Diagnosis present

## 2015-09-17 DIAGNOSIS — Z992 Dependence on renal dialysis: Secondary | ICD-10-CM | POA: Diagnosis not present

## 2015-09-17 DIAGNOSIS — E875 Hyperkalemia: Secondary | ICD-10-CM | POA: Diagnosis present

## 2015-09-17 DIAGNOSIS — Z794 Long term (current) use of insulin: Secondary | ICD-10-CM

## 2015-09-17 DIAGNOSIS — E11649 Type 2 diabetes mellitus with hypoglycemia without coma: Secondary | ICD-10-CM | POA: Diagnosis present

## 2015-09-17 DIAGNOSIS — R5381 Other malaise: Secondary | ICD-10-CM | POA: Diagnosis present

## 2015-09-17 DIAGNOSIS — E1122 Type 2 diabetes mellitus with diabetic chronic kidney disease: Secondary | ICD-10-CM | POA: Diagnosis present

## 2015-09-17 DIAGNOSIS — G8929 Other chronic pain: Secondary | ICD-10-CM

## 2015-09-17 DIAGNOSIS — I5042 Chronic combined systolic (congestive) and diastolic (congestive) heart failure: Secondary | ICD-10-CM | POA: Diagnosis present

## 2015-09-17 DIAGNOSIS — E7800 Pure hypercholesterolemia, unspecified: Secondary | ICD-10-CM | POA: Diagnosis present

## 2015-09-17 DIAGNOSIS — G40909 Epilepsy, unspecified, not intractable, without status epilepticus: Secondary | ICD-10-CM | POA: Diagnosis present

## 2015-09-17 DIAGNOSIS — F1410 Cocaine abuse, uncomplicated: Secondary | ICD-10-CM | POA: Diagnosis present

## 2015-09-17 DIAGNOSIS — F17210 Nicotine dependence, cigarettes, uncomplicated: Secondary | ICD-10-CM | POA: Diagnosis present

## 2015-09-17 DIAGNOSIS — H409 Unspecified glaucoma: Secondary | ICD-10-CM | POA: Diagnosis present

## 2015-09-17 DIAGNOSIS — K219 Gastro-esophageal reflux disease without esophagitis: Secondary | ICD-10-CM | POA: Diagnosis present

## 2015-09-17 DIAGNOSIS — E1140 Type 2 diabetes mellitus with diabetic neuropathy, unspecified: Secondary | ICD-10-CM | POA: Diagnosis present

## 2015-09-17 DIAGNOSIS — I132 Hypertensive heart and chronic kidney disease with heart failure and with stage 5 chronic kidney disease, or end stage renal disease: Secondary | ICD-10-CM | POA: Diagnosis present

## 2015-09-17 DIAGNOSIS — R109 Unspecified abdominal pain: Secondary | ICD-10-CM

## 2015-09-17 LAB — CBC
HCT: 16 % — ABNORMAL LOW (ref 36.0–46.0)
HCT: 31.6 % — ABNORMAL LOW (ref 36.0–46.0)
HEMATOCRIT: 18 % — AB (ref 36.0–46.0)
HEMATOCRIT: 27.5 % — AB (ref 36.0–46.0)
HEMOGLOBIN: 5.5 g/dL — AB (ref 12.0–15.0)
HEMOGLOBIN: 4.8 g/dL — AB (ref 12.0–15.0)
HEMOGLOBIN: 8.9 g/dL — AB (ref 12.0–15.0)
Hemoglobin: 10.2 g/dL — ABNORMAL LOW (ref 12.0–15.0)
MCH: 29.1 pg (ref 26.0–34.0)
MCH: 28.6 pg (ref 26.0–34.0)
MCH: 29.2 pg (ref 26.0–34.0)
MCH: 29.5 pg (ref 26.0–34.0)
MCHC: 30.6 g/dL (ref 30.0–36.0)
MCHC: 30 g/dL (ref 30.0–36.0)
MCHC: 32.4 g/dL (ref 30.0–36.0)
MCHC: 32.3 g/dL (ref 30.0–36.0)
MCV: 95.2 fL (ref 78.0–100.0)
MCV: 95.2 fL (ref 78.0–100.0)
MCV: 90.2 fL (ref 78.0–100.0)
MCV: 91.3 fL (ref 78.0–100.0)
PLATELETS: 238 10*3/uL (ref 150–400)
Platelets: 257 10*3/uL (ref 150–400)
Platelets: 194 10*3/uL (ref 150–400)
Platelets: 182 10*3/uL (ref 150–400)
RBC: 1.89 MIL/uL — AB (ref 3.87–5.11)
RBC: 1.68 MIL/uL — AB (ref 3.87–5.11)
RBC: 3.05 MIL/uL — ABNORMAL LOW (ref 3.87–5.11)
RBC: 3.46 MIL/uL — AB (ref 3.87–5.11)
RDW: 16.7 % — AB (ref 11.5–15.5)
RDW: 16.6 % — ABNORMAL HIGH (ref 11.5–15.5)
RDW: 15.2 % (ref 11.5–15.5)
RDW: 15.6 % — AB (ref 11.5–15.5)
WBC: 4.1 10*3/uL (ref 4.0–10.5)
WBC: 4 10*3/uL (ref 4.0–10.5)
WBC: 3.3 10*3/uL — AB (ref 4.0–10.5)
WBC: 4.2 10*3/uL (ref 4.0–10.5)

## 2015-09-17 LAB — COMPREHENSIVE METABOLIC PANEL
ALT: 29 U/L (ref 14–54)
ANION GAP: 21 — AB (ref 5–15)
AST: 32 U/L (ref 15–41)
Albumin: 2.9 g/dL — ABNORMAL LOW (ref 3.5–5.0)
Alkaline Phosphatase: 59 U/L (ref 38–126)
BILIRUBIN TOTAL: 0.9 mg/dL (ref 0.3–1.2)
BUN: 85 mg/dL — ABNORMAL HIGH (ref 6–20)
CHLORIDE: 96 mmol/L — AB (ref 101–111)
CO2: 23 mmol/L (ref 22–32)
Calcium: 8.2 mg/dL — ABNORMAL LOW (ref 8.9–10.3)
Creatinine, Ser: 13.19 mg/dL — ABNORMAL HIGH (ref 0.44–1.00)
GFR, EST AFRICAN AMERICAN: 3 mL/min — AB (ref >60)
GFR, EST NON AFRICAN AMERICAN: 3 mL/min — AB (ref >60)
Glucose, Bld: 80 mg/dL (ref 65–99)
POTASSIUM: 6.6 mmol/L — AB (ref 3.5–5.1)
Sodium: 140 mmol/L (ref 135–145)
TOTAL PROTEIN: 6.2 g/dL — AB (ref 6.5–8.1)

## 2015-09-17 LAB — RAPID URINE DRUG SCREEN, HOSP PERFORMED
AMPHETAMINES: NOT DETECTED
BARBITURATES: NOT DETECTED
BENZODIAZEPINES: NOT DETECTED
COCAINE: POSITIVE — AB
Opiates: NOT DETECTED
Tetrahydrocannabinol: NOT DETECTED

## 2015-09-17 LAB — TROPONIN I
TROPONIN I: 0.05 ng/mL — AB (ref <0.031)
Troponin I: 0.06 ng/mL — ABNORMAL HIGH (ref <0.031)
Troponin I: 0.05 ng/mL — ABNORMAL HIGH (ref <0.031)

## 2015-09-17 LAB — BASIC METABOLIC PANEL
ANION GAP: 18 — AB (ref 5–15)
BUN: 89 mg/dL — ABNORMAL HIGH (ref 6–20)
CHLORIDE: 98 mmol/L — AB (ref 101–111)
CO2: 24 mmol/L (ref 22–32)
Calcium: 8.1 mg/dL — ABNORMAL LOW (ref 8.9–10.3)
Creatinine, Ser: 13.07 mg/dL — ABNORMAL HIGH (ref 0.44–1.00)
GFR calc non Af Amer: 3 mL/min — ABNORMAL LOW (ref >60)
GFR, EST AFRICAN AMERICAN: 3 mL/min — AB (ref >60)
GLUCOSE: 119 mg/dL — AB (ref 65–99)
Potassium: 5.9 mmol/L — ABNORMAL HIGH (ref 3.5–5.1)
Sodium: 140 mmol/L (ref 135–145)

## 2015-09-17 LAB — IRON AND TIBC
Iron: 84 ug/dL (ref 28–170)
Saturation Ratios: 26 % (ref 10.4–31.8)
TIBC: 326 ug/dL (ref 250–450)
UIBC: 242 ug/dL

## 2015-09-17 LAB — CBG MONITORING, ED
GLUCOSE-CAPILLARY: 48 mg/dL — AB (ref 65–99)
GLUCOSE-CAPILLARY: 120 mg/dL — AB (ref 65–99)
GLUCOSE-CAPILLARY: 93 mg/dL (ref 65–99)
Glucose-Capillary: 43 mg/dL — CL (ref 65–99)
Glucose-Capillary: 61 mg/dL — ABNORMAL LOW (ref 65–99)
Glucose-Capillary: 73 mg/dL (ref 65–99)

## 2015-09-17 LAB — PREPARE RBC (CROSSMATCH)

## 2015-09-17 LAB — APTT: aPTT: 30 seconds (ref 24–37)

## 2015-09-17 LAB — URINALYSIS, ROUTINE W REFLEX MICROSCOPIC
Bilirubin Urine: NEGATIVE
Glucose, UA: NEGATIVE mg/dL
KETONES UR: NEGATIVE mg/dL
LEUKOCYTES UA: NEGATIVE
NITRITE: NEGATIVE
PH: 8 (ref 5.0–8.0)
Protein, ur: >300 mg/dL — AB
Specific Gravity, Urine: 1.013 (ref 1.005–1.030)

## 2015-09-17 LAB — PROTIME-INR
INR: 1.26 (ref 0.00–1.49)
Prothrombin Time: 16 seconds — ABNORMAL HIGH (ref 11.6–15.2)

## 2015-09-17 LAB — URINE MICROSCOPIC-ADD ON

## 2015-09-17 LAB — GLUCOSE, CAPILLARY
GLUCOSE-CAPILLARY: 62 mg/dL — AB (ref 65–99)
GLUCOSE-CAPILLARY: 105 mg/dL — AB (ref 65–99)
GLUCOSE-CAPILLARY: 51 mg/dL — AB (ref 65–99)

## 2015-09-17 LAB — HIV ANTIBODY (ROUTINE TESTING W REFLEX): HIV SCREEN 4TH GENERATION: NONREACTIVE

## 2015-09-17 LAB — MRSA PCR SCREENING: MRSA BY PCR: NEGATIVE

## 2015-09-17 MED ORDER — OXYCODONE-ACETAMINOPHEN 5-325 MG PO TABS
ORAL_TABLET | ORAL | Status: AC
  Filled 2015-09-17: qty 1

## 2015-09-17 MED ORDER — OXYCODONE HCL 5 MG PO TABS
10.0000 mg | ORAL_TABLET | Freq: Four times a day (QID) | ORAL | Status: DC | PRN
  Administered 2015-09-17 (×2): 10 mg via ORAL
  Filled 2015-09-17 (×2): qty 2

## 2015-09-17 MED ORDER — HEPARIN SODIUM (PORCINE) 1000 UNIT/ML DIALYSIS
1000.0000 [IU] | INTRAMUSCULAR | Status: DC | PRN
  Filled 2015-09-17: qty 1

## 2015-09-17 MED ORDER — DIPHENHYDRAMINE HCL 25 MG PO CAPS: 25.0000 mg | ORAL_CAPSULE | Freq: Four times a day (QID) | ORAL | Status: DC | PRN

## 2015-09-17 MED ORDER — HYDROXYZINE HCL 25 MG PO TABS
ORAL_TABLET | ORAL | Status: AC
  Filled 2015-09-17: qty 1

## 2015-09-17 MED ORDER — VITAMIN D 1000 UNITS PO TABS
1000.0000 [IU] | ORAL_TABLET | Freq: Every day | ORAL | Status: DC
  Administered 2015-09-18 – 2015-09-20 (×3): 1000 [IU] via ORAL
  Filled 2015-09-17 (×4): qty 1

## 2015-09-17 MED ORDER — DARBEPOETIN ALFA 200 MCG/0.4ML IJ SOSY: 200.0000 ug | PREFILLED_SYRINGE | INTRAMUSCULAR | Status: DC

## 2015-09-17 MED ORDER — DEXTROSE-NACL 5-0.45 % IV SOLN
INTRAVENOUS | Status: DC
  Administered 2015-09-17: 06:00:00 via INTRAVENOUS

## 2015-09-17 MED ORDER — CALCIUM GLUCONATE 10 % IV SOLN
1.0000 g | Freq: Once | INTRAVENOUS | Status: AC
  Administered 2015-09-17: 1 g via INTRAVENOUS
  Filled 2015-09-17: qty 10

## 2015-09-17 MED ORDER — MUPIROCIN CALCIUM 2 % EX CREA
1.0000 "application " | TOPICAL_CREAM | Freq: Every day | CUTANEOUS | Status: DC | PRN
  Filled 2015-09-17: qty 15

## 2015-09-17 MED ORDER — HYDROXYZINE HCL 50 MG/ML IM SOLN: 25.0000 mg | Freq: Four times a day (QID) | INTRAMUSCULAR | Status: DC | PRN

## 2015-09-17 MED ORDER — DEXTROSE 5 % IV SOLN: INTRAVENOUS | Status: DC

## 2015-09-17 MED ORDER — HYDRALAZINE HCL 20 MG/ML IJ SOLN
5.0000 mg | Freq: Once | INTRAMUSCULAR | Status: AC
  Administered 2015-09-17: 5 mg via INTRAVENOUS
  Filled 2015-09-17: qty 1

## 2015-09-17 MED ORDER — SODIUM CHLORIDE 0.9 % IV SOLN: 1.0000 g | Freq: Once | INTRAVENOUS | Status: DC

## 2015-09-17 MED ORDER — SODIUM CHLORIDE 0.9 % IV SOLN
500.0000 mg | Freq: Two times a day (BID) | INTRAVENOUS | Status: DC
  Administered 2015-09-17 – 2015-09-18 (×2): 500 mg via INTRAVENOUS
  Filled 2015-09-17 (×6): qty 5

## 2015-09-17 MED ORDER — DARBEPOETIN ALFA 200 MCG/0.4ML IJ SOSY
200.0000 ug | PREFILLED_SYRINGE | Freq: Once | INTRAMUSCULAR | Status: DC
  Filled 2015-09-17: qty 0.4

## 2015-09-17 MED ORDER — VITAMIN A 8000 UNITS PO CAPS: 8000.0000 [IU] | ORAL_CAPSULE | Freq: Every day | ORAL | Status: DC

## 2015-09-17 MED ORDER — FOLIC ACID 1 MG PO TABS
1.0000 mg | ORAL_TABLET | Freq: Every day | ORAL | Status: DC
  Administered 2015-09-18 – 2015-09-20 (×3): 1 mg via ORAL
  Filled 2015-09-17 (×4): qty 1

## 2015-09-17 MED ORDER — INSULIN ASPART 100 UNIT/ML IV SOLN
5.0000 [IU] | Freq: Once | INTRAVENOUS | Status: AC
  Administered 2015-09-17: 5 [IU] via INTRAVENOUS
  Filled 2015-09-17: qty 1

## 2015-09-17 MED ORDER — FERROUS SULFATE 325 (65 FE) MG PO TABS
325.0000 mg | ORAL_TABLET | Freq: Every day | ORAL | Status: DC
  Administered 2015-09-18: 325 mg via ORAL
  Filled 2015-09-17 (×2): qty 1

## 2015-09-17 MED ORDER — ACETAMINOPHEN 650 MG RE SUPP: 650.0000 mg | Freq: Four times a day (QID) | RECTAL | Status: DC | PRN

## 2015-09-17 MED ORDER — NIACIN 500 MG PO TABS
500.0000 mg | ORAL_TABLET | Freq: Every day | ORAL | Status: DC
  Administered 2015-09-17 – 2015-09-19 (×3): 500 mg via ORAL
  Filled 2015-09-17 (×5): qty 1

## 2015-09-17 MED ORDER — DEXTROSE 50 % IV SOLN
1.0000 | Freq: Once | INTRAVENOUS | Status: AC
  Administered 2015-09-17: 50 mL via INTRAVENOUS
  Filled 2015-09-17: qty 50

## 2015-09-17 MED ORDER — LORAZEPAM 2 MG/ML IJ SOLN
0.5000 mg | INTRAMUSCULAR | Status: DC | PRN
  Administered 2015-09-17 (×2): 0.5 mg via INTRAVENOUS
  Filled 2015-09-17 (×2): qty 1

## 2015-09-17 MED ORDER — HYDROXYZINE HCL 50 MG/ML IM SOLN
25.0000 mg | Freq: Four times a day (QID) | INTRAMUSCULAR | Status: DC | PRN
  Administered 2015-09-17: 25 mg via INTRAMUSCULAR
  Filled 2015-09-17 (×3): qty 0.5

## 2015-09-17 MED ORDER — SODIUM CHLORIDE 0.9% FLUSH
3.0000 mL | Freq: Two times a day (BID) | INTRAVENOUS | Status: DC
  Administered 2015-09-17 – 2015-09-20 (×6): 3 mL via INTRAVENOUS

## 2015-09-17 MED ORDER — ACETAMINOPHEN 325 MG PO TABS
650.0000 mg | ORAL_TABLET | Freq: Four times a day (QID) | ORAL | Status: DC | PRN
  Administered 2015-09-17: 650 mg via ORAL
  Filled 2015-09-17: qty 2

## 2015-09-17 MED ORDER — PANTOPRAZOLE SODIUM 40 MG IV SOLR
40.0000 mg | Freq: Two times a day (BID) | INTRAVENOUS | Status: DC
  Administered 2015-09-17 – 2015-09-18 (×3): 40 mg via INTRAVENOUS
  Filled 2015-09-17 (×4): qty 40

## 2015-09-17 MED ORDER — LIDOCAINE HCL (PF) 1 % IJ SOLN: 5.0000 mL | INTRAMUSCULAR | Status: DC | PRN

## 2015-09-17 MED ORDER — B-12 2500 MCG PO TABS: 1.0000 | ORAL_TABLET | Freq: Every day | ORAL | Status: DC

## 2015-09-17 MED ORDER — SODIUM CHLORIDE 0.9 % IV SOLN: 100.0000 mL | INTRAVENOUS | Status: DC | PRN

## 2015-09-17 MED ORDER — PENTAFLUOROPROP-TETRAFLUOROETH EX AERO: 1.0000 "application " | INHALATION_SPRAY | CUTANEOUS | Status: DC | PRN

## 2015-09-17 MED ORDER — HYDRALAZINE HCL 20 MG/ML IJ SOLN
5.0000 mg | Freq: Four times a day (QID) | INTRAMUSCULAR | Status: DC | PRN
  Administered 2015-09-17 – 2015-09-20 (×5): 5 mg via INTRAVENOUS
  Filled 2015-09-17 (×5): qty 1

## 2015-09-17 MED ORDER — COQ10 100 MG PO CAPS: 1.0000 | ORAL_CAPSULE | Freq: Every day | ORAL | Status: DC

## 2015-09-17 MED ORDER — SODIUM BICARBONATE 8.4 % IV SOLN
50.0000 meq | Freq: Once | INTRAVENOUS | Status: AC
  Administered 2015-09-17: 50 meq via INTRAVENOUS
  Filled 2015-09-17: qty 50

## 2015-09-17 MED ORDER — FENTANYL CITRATE (PF) 100 MCG/2ML IJ SOLN
50.0000 ug | Freq: Once | INTRAMUSCULAR | Status: AC
  Administered 2015-09-17: 50 ug via INTRAVENOUS
  Filled 2015-09-17: qty 2

## 2015-09-17 MED ORDER — SODIUM CHLORIDE 0.9 % IV SOLN
10.0000 mL/h | Freq: Once | INTRAVENOUS | Status: AC
  Administered 2015-09-17: 10 mL/h via INTRAVENOUS

## 2015-09-17 MED ORDER — MORPHINE SULFATE (PF) 2 MG/ML IV SOLN
2.0000 mg | Freq: Once | INTRAVENOUS | Status: AC
  Administered 2015-09-17: 2 mg via INTRAVENOUS
  Filled 2015-09-17: qty 1

## 2015-09-17 MED ORDER — DIPHENHYDRAMINE HCL 25 MG PO CAPS
25.0000 mg | ORAL_CAPSULE | Freq: Four times a day (QID) | ORAL | Status: DC | PRN
  Administered 2015-09-17 – 2015-09-18 (×2): 25 mg via ORAL
  Filled 2015-09-17 (×2): qty 1

## 2015-09-17 MED ORDER — AMLODIPINE BESYLATE 5 MG PO TABS
5.0000 mg | ORAL_TABLET | Freq: Every day | ORAL | Status: DC
  Administered 2015-09-17 – 2015-09-19 (×3): 5 mg via ORAL
  Filled 2015-09-17 (×3): qty 1

## 2015-09-17 MED ORDER — AMLODIPINE BESYLATE 5 MG PO TABS
5.0000 mg | ORAL_TABLET | Freq: Every day | ORAL | Status: DC
  Administered 2015-09-17: 5 mg via ORAL
  Filled 2015-09-17: qty 1

## 2015-09-17 MED ORDER — MORPHINE SULFATE (PF) 4 MG/ML IV SOLN
4.0000 mg | INTRAVENOUS | Status: DC | PRN
  Administered 2015-09-17 – 2015-09-18 (×2): 4 mg via INTRAVENOUS
  Filled 2015-09-17 (×2): qty 1

## 2015-09-17 MED ORDER — DEXTROSE 50 % IV SOLN
INTRAVENOUS | Status: AC
  Administered 2015-09-17: 25 mL
  Filled 2015-09-17: qty 50

## 2015-09-17 MED ORDER — INSULIN ASPART 100 UNIT/ML ~~LOC~~ SOLN
0.0000 [IU] | Freq: Three times a day (TID) | SUBCUTANEOUS | Status: DC
  Administered 2015-09-18: 2 [IU] via SUBCUTANEOUS
  Administered 2015-09-18 – 2015-09-19 (×3): 3 [IU] via SUBCUTANEOUS
  Administered 2015-09-19 – 2015-09-20 (×3): 2 [IU] via SUBCUTANEOUS
  Administered 2015-09-20: 7 [IU] via SUBCUTANEOUS

## 2015-09-17 MED ORDER — OXYCODONE-ACETAMINOPHEN 5-325 MG PO TABS
1.0000 | ORAL_TABLET | Freq: Once | ORAL | Status: AC
  Administered 2015-09-17: 1 via ORAL

## 2015-09-17 MED ORDER — INSULIN GLARGINE 100 UNIT/ML ~~LOC~~ SOLN: 10.0000 [IU] | Freq: Every day | SUBCUTANEOUS | Status: DC

## 2015-09-17 MED ORDER — VITAMIN C 500 MG PO TABS
2500.0000 mg | ORAL_TABLET | Freq: Every day | ORAL | Status: DC
  Filled 2015-09-17: qty 5

## 2015-09-17 MED ORDER — ADULT MULTIVITAMIN W/MINERALS CH
1.0000 | ORAL_TABLET | Freq: Every day | ORAL | Status: DC
  Administered 2015-09-17 – 2015-09-20 (×4): 1 via ORAL
  Filled 2015-09-17 (×4): qty 1

## 2015-09-17 MED ORDER — HYDRALAZINE HCL 20 MG/ML IJ SOLN: 5.0000 mg | INTRAMUSCULAR | Status: DC | PRN

## 2015-09-17 MED ORDER — ATORVASTATIN CALCIUM 40 MG PO TABS
40.0000 mg | ORAL_TABLET | Freq: Every day | ORAL | Status: DC
  Administered 2015-09-17 – 2015-09-19 (×3): 40 mg via ORAL
  Filled 2015-09-17 (×3): qty 1

## 2015-09-17 MED ORDER — NICOTINE 21 MG/24HR TD PT24
21.0000 mg | MEDICATED_PATCH | Freq: Every day | TRANSDERMAL | Status: DC
  Administered 2015-09-18 – 2015-09-19 (×2): 21 mg via TRANSDERMAL
  Filled 2015-09-17 (×4): qty 1

## 2015-09-17 MED ORDER — DEXTROSE-NACL 5-0.9 % IV SOLN
INTRAVENOUS | Status: DC
  Administered 2015-09-17: 23:00:00 via INTRAVENOUS

## 2015-09-17 MED ORDER — LIDOCAINE-PRILOCAINE 2.5-2.5 % EX CREA: 1.0000 "application " | TOPICAL_CREAM | CUTANEOUS | Status: DC | PRN

## 2015-09-17 MED ORDER — ALTEPLASE 2 MG IJ SOLR
2.0000 mg | Freq: Once | INTRAMUSCULAR | Status: DC | PRN
  Filled 2015-09-17: qty 2

## 2015-09-17 NOTE — Progress Notes (Signed)
Hypoglycemic Event  CBG: 51 @ 1946  Treatment: half amp of D50  Symptoms: none  Follow-up CBG: Time: 2028 CBG Result: 108  Possible Reasons for Event: unknown  Comments/MD notified: MD notified     [name]

## 2015-09-17 NOTE — ED Notes (Signed)
PER DR. Blaine Hamper GIVE ONLY ONE UNIT OF BLOOD D/T ELEVATED POTASSIUM.

## 2015-09-17 NOTE — ED Notes (Signed)
Patient reports last time she went to dialysis was Wed 08/05/2014. Normal schedule is Mon/Wed/Fri.

## 2015-09-17 NOTE — ED Notes (Signed)
Patient agreed to receive blood after talking with Dr. Blaine Hamper, who will order IV pain medicine.

## 2015-09-17 NOTE — H&P (Addendum)
[facility] History and Physical  [name] QQP:[id number] DOB: Feb 24, 1966 DOA: 09/17/2015  Referring physician: ED physician PCP: [name], MD  Specialists:   Chief Complaint: rectal bleeding, left foot and right elbow pain after fall.   HPI: [name] is a 50 y.o. female with PMH of Jehovh's witness (she agreed with blood transfusion toady), hypertension, hyperlipidemia, diabetes mellitus, GERD, depression, anxiety, anemia, GI bleeding, TIA, stroke, bipolar disorder, schizophrenia, HCV, ESRD-HD (dialysis noncompliance, last dialysis on 08/05/14), seizure, combined systolic and diastolic congestive heart failure, who presents with rectal bleeding, seeking for help to detox, left foot and right elbow pain after fall.  Patient reports that she has intermittent GI bleeding for more than 3 months, which has been worsening in the past several days. She has a large rectal bleeding today with bright red colored stool and dark clots in the stool. She had nausea and vomited 3 times without blood in her vomitus, but no abdominal pain.  She said she has been using cocaine, and used it yesterday. She is here also for seeking help to detox. She continues to smoke cigarettes occasionally per patient.   She reports that she had a fall last week, causing injury to her left foot and right elbow. She denies any head injury. She has pain over left foot and right elbow. She also has a chronic back pain. She has mild cough and mild shortness breath on exertion, but no shortness of breath currently. She had mild chest pain, which has resolved. She states that she did not do dialysis since 08/05/14 since she did not like it. Now she wants to do HD today. Patient does not have hematuria, symptoms of UTI, unilateral weakness, numbness or tingling sensations.  In ED, patient was found to have hemoglobin drop from 10.1 on 07/06/15-5.5, positive UDS for cocaine, negative urinalysis, potassium 6.6 with T-wave  peaking, creatinine 13. quinine, BUN 85. Patient is admitted to inpatient for further eval and treatment. GI was consulted by EDP, Dr. Leonides Schanz.  EKG: Independently reviewed. QTC 513, T-wave peaking.  Where does patient live?   At home  Can patient participate in ADLs? Some   Review of Systems:   General: no fevers, chills, no changes in body weight, has poor appetite, has fatigue HEENT: no blurry vision, hearing changes or sore throat Pulm: has dyspnea, coughing, no wheezing CV: no chest pain, palpitations Abd: has nausea, vomiting, no abdominal pain, diarrhea, constipation GU: no dysuria, burning on urination, increased urinary frequency, hematuria  Ext: has ankle edema Neuro: no unilateral weakness, numbness, or tingling, no vision change or hearing loss Skin: no rash MSK: No muscle spasm, no deformity, no limitation of range of movement in spin Heme: No easy bruising.  Travel history: No recent long distant travel.  Allergy:  Allergies  Allergen Reactions  . Gabapentin Itching and Swelling  . Gabapentin Swelling  . Lyrica [Pregabalin] Swelling    Facial and leg swelling  . Other     NO BLOOD PRODUCTS. PT IS A JEHOVAH WITNESS  . Penicillins Hives    Has patient had a PCN reaction causing immediate rash, facial/tongue/throat swelling, SOB or lightheadedness with hypotension: Yes Has patient had a PCN reaction causing severe rash involving mucus membranes or skin necrosis: No Has patient had a PCN reaction that required hospitalization No Has patient had a PCN reaction occurring within the last 10 years: No If all of the above answers are "NO", then may proceed with Cephalosporin use. Pt  tolerates cefepime and ceftriaxone  . Saphris [Asenapine] Swelling    Swelling of the face  . Saphris [Asenapine] Swelling    Facial swelling  . Tramadol Swelling    Causes legs to swell  . Tramadol Swelling    Leg swelling    Past Medical History  Diagnosis Date  . Neuropathy ([place])    . Glaucoma   . Bipolar 1 disorder ([place])   . Refusal of blood transfusions as patient is Jehovah's Witness   . TIA (transient ischemic attack) 2010  . MRSA infection ?2014    "on the back of my head; spread throughout my bloodstream"  . Bipolar disorder, unspecified ([place]) 02/06/2014  . High cholesterol   . 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 Hepatitis C   . Chronic pain syndrome   . ESRD on hemodialysis ([place])   . Schizoaffective disorder, unspecified condition 02/08/2014  . Seizure [facility]) dx'd 2014    "don't know what kind; last one was ~ 04/2014"  . Chronic combined systolic and diastolic CHF (congestive heart failure) (HCC)     EF 40% by echo and 48% by stress test  . Anemia   . Hypertension   . Depression   . Anxiety   . GERD (gastroesophageal reflux disease)   . History of hiatal hernia   . Headache     sinus headaches  . History of vitamin D deficiency 06/20/2015  . Stroke [facility])     TIA 2009    Past Surgical History  Procedure Laterality Date  . Amputation Left 05/21/2013    Procedure: Left Midfoot AMPUTATION;  Surgeon: Newt Minion, MD;  Location: [place];  Service: Orthopedics;  Laterality: Left;  Left Midfoot Amputation  . Tubal ligation    . Colonoscopy with propofol N/A 06/22/2013    Procedure: COLONOSCOPY WITH PROPOFOL;  Surgeon: Jeryl Columbia, MD;  Location: [facility] ENDOSCOPY;  Service: Endoscopy;  Laterality: N/A;  . Abdominal hysterectomy  2014  . Tonsillectomy  1970's  . Refractive surgery Bilateral 2013  . Tubal ligation  2000  . Abdominal hysterectomy  2010  . Foot amputation Left     due diabetic neuropathy, could not feel ulcer on bottom of foot  . Eye surgery Bilateral 2010    Lasik  . Av fistula placement Left 05/04/2015    Procedure: ARTERIOVENOUS (AV) GRAFT INSERTION;  Surgeon: Angelia Mould, MD;  Location: [place];  Service: Vascular;  Laterality: Left;  . Esophagogastroduodenoscopy N/A 05/11/2015    Procedure:  ESOPHAGOGASTRODUODENOSCOPY (EGD);  Surgeon: Laurence Spates, MD;  Location: [facility] ENDOSCOPY;  Service: Endoscopy;  Laterality: N/A;    Social History:  reports that she has been smoking Cigarettes.  She has a 20 pack-year smoking history. She has never used smokeless tobacco. She reports that she uses illicit drugs ("Crack" cocaine and Cocaine). She reports that she does not drink alcohol.  Family History:  Family History  Problem Relation Age of Onset  . Hypertension Mother   . Hypertension Father      Prior to Admission medications   Medication Sig Start Date End Date Taking? Authorizing Provider  amLODipine (NORVASC) 5 MG tablet Take 2 tablets (10 mg total) by mouth at bedtime. Patient taking differently: Take 5 mg by mouth at bedtime.  05/12/15  Yes Modena Jansky, MD  atorvastatin (LIPITOR) 40 MG tablet Take 1 tablet (40 mg total) by mouth at bedtime. 05/07/15  Yes Ripudeep Krystal Eaton, MD  Cholecalciferol (  VITAMIN D3) 1000 UNITS CAPS Take 1 capsule by mouth daily.   Yes Historical Provider, MD  Coenzyme Q10 (COQ10) 100 MG CAPS Take 1 capsule by mouth daily.   Yes Historical Provider, MD  Cyanocobalamin (B-12) 2500 MCG TABS Take 1 capsule by mouth daily.   Yes Historical Provider, MD  diphenhydrAMINE (BENADRYL) 25 mg capsule Take 1 capsule (25 mg total) by mouth every 6 (six) hours as needed for itching. 05/07/15  Yes Ripudeep Krystal Eaton, MD  ferrous sulfate 325 (65 FE) MG tablet Take 1 tablet (325 mg total) by mouth daily with breakfast. 06/24/14  Yes Debbe Odea, MD  folic acid (FOLVITE) 884 MCG tablet Take 800 mcg by mouth daily.   Yes Historical Provider, MD  insulin aspart (NOVOLOG) 100 UNIT/ML injection Inject 2-10 Units into the skin 3 (three) times daily before meals. SSI: 0-200 = 2 units, 201-250 = 4 units, 251-300 = 6 units, 301-350 = 8 units, 351-400 = 10 units 05/07/15  Yes Ripudeep K Rai, MD  insulin glargine (LANTUS) 100 UNIT/ML injection Inject 0.25 mLs (25 Units total) into the skin at  bedtime. 05/07/15  Yes Ripudeep Krystal Eaton, MD  levETIRAcetam (KEPPRA) 500 MG tablet Take 500 mg by mouth every 12 (twelve) hours. 01/25/15  Yes Historical Provider, MD  Multiple Vitamin (MULTIVITAMIN WITH MINERALS) TABS tablet Take 1 tablet by mouth daily.   Yes Historical Provider, MD  mupirocin cream (BACTROBAN) 2 % Apply topically daily. Patient taking differently: Apply 1 application topically daily as needed (itching, skin irritation).  08/27/14  Yes Geradine Girt, DO  niacin 500 MG tablet Take 500 mg by mouth at bedtime.   Yes Historical Provider, MD  Oxycodone HCl 10 MG TABS Take 1 tablet (10 mg total) by mouth every 6 (six) hours as needed. 06/19/15  Yes Milinda Pointer, MD  polyethylene glycol (MIRALAX / GLYCOLAX) packet Take 17 g by mouth 2 (two) times daily. Mix in 8 oz liquid and drink 08/24/14  Yes Historical Provider, MD  promethazine (PHENERGAN) 12.5 MG tablet Take 1 tablet (12.5 mg total) by mouth every 6 (six) hours as needed for nausea. 07/06/15  Yes Jaime Pilcher Ward, PA-C  vitamin A 8000 UNIT capsule Take 8,000 Units by mouth daily.   Yes Historical Provider, MD  Blood Glucose Monitoring Suppl (ACCU-CHEK AVIVA PLUS) W/DEVICE KIT 1 each by Does not apply route 4 (four) times daily - after meals and at bedtime. 12/13/14   Arnoldo Morale, MD  Lancet Devices [facility]) lancets Use as instructed 12/13/14   Arnoldo Morale, MD    Physical Exam: Filed Vitals:   09/17/15 0600 09/17/15 0615 09/17/15 0630 09/17/15 0645  BP: 131/63 119/60 134/62 135/64  Pulse: 72 70 70 62  Temp:      TempSrc:      Resp: '12 13 12 19  '$ SpO2: 100% 100% 99% 100%   General: Not in acute distress. Pale looking. HEENT:       Eyes: PERRL, EOMI, no scleral icterus.       ENT: No discharge from the ears and nose, no pharynx injection, no tonsillar enlargement.        Neck: No JVD, no bruit, no mass felt. Heme: No neck lymph node enlargement. Cardiac: S1/S2, RRR, No murmurs, No gallops or rubs. Pulm: No  rales, wheezing, rhonchi or rubs. Abd: Soft, nondistended, nontender, no rebound pain, no organomegaly, BS present. Ext: No pitting leg edema bilaterally. 2+DP/PT pulse bilaterally. Has functioning AV fistula over left arm Musculoskeletal:  No joint deformities, No joint redness or warmth, no limitation of ROM in spin. Skin: No rashes.  Neuro: Alert, oriented X3, cranial nerves II-XII grossly intact, moves all extremities normally. Psych: no suicidal or hemocidal ideation.  Labs on Admission:  Basic Metabolic Panel:  Recent Labs Lab 09/17/15 0107 09/17/15 0439  NA 140 140  K 6.6* 5.9*  CL 96* 98*  CO2 23 24  GLUCOSE 80 119*  BUN 85* 89*  CREATININE 13.19* 13.07*  CALCIUM 8.2* 8.1*   Liver Function Tests:  Recent Labs Lab 09/17/15 0107  AST 32  ALT 29  ALKPHOS 59  BILITOT 0.9  PROT 6.2*  ALBUMIN 2.9*   No results for input(s): LIPASE, AMYLASE in the last 168 hours. No results for input(s): AMMONIA in the last 168 hours. CBC:  Recent Labs Lab 09/17/15 0107 09/17/15 0531  WBC 4.1 4.0  HGB 5.5* 4.8*  HCT 18.0* 16.0*  MCV 95.2 95.2  PLT 257 194   Cardiac Enzymes:  Recent Labs Lab 09/17/15 0439  TROPONINI 0.06*    BNP (last 3 results)  Recent Labs  09/30/14 1324 10/27/14 2146 11/09/14 0251  BNP 61.6 63.7 242.0*    ProBNP (last 3 results) No results for input(s): PROBNP in the last 8760 hours.  CBG:  Recent Labs Lab 09/17/15 0512 09/17/15 0650  GLUCAP 25* 61*    Radiological Exams on Admission: Dg Elbow Complete Right  09/17/2015  CLINICAL DATA:  50 year old female with fall EXAM: RIGHT ELBOW - COMPLETE 3+ VIEW COMPARISON:  Radiograph dated 07/22/2014 FINDINGS: There is an old supracondylar fracture of right humerus with with widening of the fracture line and nonunion. There is fragmentation and non bridging callus along the fracture. There is approximately 1.5 cm lateral displacement of the distal fracture fragment similar to prior study. No  new fracture identified. There is soft tissue swelling of the elbow with a small joint effusion. 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 Stable 1.5 cm lateral displacement of the distal fracture fragment. Electronically Signed   By: Anner Crete M.D.   On: 09/17/2015 03:38   Dg Chest Port 1 View  09/17/2015  CLINICAL DATA:  History of end-stage renal disease, missed multiple dialysis appointments. History of diabetes, hypertension. EXAM: PORTABLE CHEST 1 VIEW COMPARISON:  Chest radiograph November 22, 2014 FINDINGS: Cardiac silhouette is mildly enlarged, mediastinal silhouette is unremarkable. Pulmonary vasculature appears normal. No pleural effusion or focal consolidation. No pneumothorax. Soft tissue planes and included osseous structures are nonsuspicious. IMPRESSION: Mild cardiomegaly, no acute pulmonary process. Electronically Signed   By: Elon Alas M.D.   On: 09/17/2015 02:41   Dg Foot Complete Left  09/17/2015  CLINICAL DATA:  50 year old female with fall. EXAM: LEFT FOOT - COMPLETE 3+ VIEW COMPARISON:  Radiograph dated 16 16 FINDINGS: There is stable appearing transmetatarsal amputation of the left foot. A small bone fragment noted adjacent to the base of the first metatarsal similar to prior study. There is no acute fracture. No dislocation. There is diffuse soft tissue thickening at the amputation site with mild diffuse subcutaneous edema. No soft tissue gas identified. 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 No acute osseous pathology. Electronically Signed   By: Anner Crete M.D.   On: 09/17/2015 03:41    Assessment/Plan Principal Problem:   GI bleeding Active Problems:   Seizure disorder (HCC)   Bipolar disorder (HCC)   Schizoaffective disorder, unspecified type ([place])  Chronic combined systolic and diastolic CHF (congestive heart failure) (HCC)   Diabetes  mellitus with nephropathy (HCC)   Anemia of chronic kidney failure   ESRD (end stage renal disease) on dialysis (HCC)   Chronic pain syndrome   Long term current use of opiate analgesic   Cocaine abuse   History of stroke   Hyperkalemia   GIB (gastrointestinal bleeding)   Fall   GI bleeding: Hemoglobin dropped from 10.1 on 07/06/15--> 5.5-->4.8. She is hemodynamically stable. Mental status okay. Patient had EGD on 05/11/15 by Dr. Percell Miller, which was negative. Patient also had colonoscopy on 06/22/13 by Dr. Watt Climes, which showed no signs of GI bleeding in the colon, 2 small internal and external thyroid with some small tear observed. Otherwise normal study though preparation was poor. GI was consulted by EDP  - will admit to SDU - Transfuse 3 units of blood now - GI consulted by Ed, will follow up recommendations - NPO for possible EGD - Start IV pantoprazole 40 mg bib - prn hydroxyzine IV for nausea - Avoid NSAIDs and SQ heparin - Maintain IV access (2 large bore IVs if possible). - Monitor closely and follow q6h cbc, transfuse as necessary. - LaB: INR, PTT, troponin x 1, FOBT  Seizure: -seizure precaution. -switch oral Keppra to IV -Check Keppra level  Bipolar disorder (HCC) and Schizoaffective disorder: Not on medications at home. Patient is agitated, but no suicidal homicidal ideations. -May consult to or f/u with psychiatry.  Chronic combined systolic and diastolic CHF (congestive heart failure) ([place]): 2-D echo on 08/26/14 showed EF of 40%. Volume is managed by dialysis. Patient did not have dialysis since 08/05/14. She is clinically dry. His CHF is compensated. -Portable management per renal  DM-II: Last A1c 10.3 on 05/05/15, poorly controled. Patient is taking Lantus and NovoLog at home -will decrease Lantus dose from 25-10 units daily  -SSI  ESRD (end stage renal disease) on dialysis [facility]): Did not have dialysis since 08/05/14. Potassium 6.6, bicarbonate 23, creatinine is 13.19,  BUN 85. Mental status okay. -Consulted renal for urgent dialysis -Treated hyperkalemia  Hyperkalemia: Potassium 6.6 with T-wave peaking in. -Treated with insulin, carbonated and gluconate. - Urgent dialysis per renal  Polysubstance abuse: Including tobacco abuse and cocaine abuse. Denies drinking alcohol recently. UDS positive for cocaine today. -Did counseling about importance of quitting substance abuse. -Nicotine patch  Fall and injury to Left foot and right elbow: denies head injury. No focal neurologic findings. X-ray of R elbow showed chronic supracondylar fracture of the right elbow with widened fracture gap and nonunion similar to prior exam. Stable 1.5 cm lateral displacement of the distal fracture fragment. And X-ray of left foot showed stable appearing transmetatarsal amputation of the left foot. No acute osseous pathology. -continue home prn oxycodone -When necessary Tylenol  HTN; -Amlodipine -IV hydralazine when necessary  HLD: Last LDL was 36 on 05/10/15 -Continue home medications: Lipitor  Anemia duo blood loss and ESRD: transfuse 3 units of blood -continue iron supplement -Aranesp per renal  DVT ppx: SCD  Code Status: Full code Family Communication: None at bed side.  Disposition Plan: Admit to inpatient   Date of Service 09/17/2015    Ivor Costa [facility] Pager [phone]  If 7PM-7AM, please contact night-coverage [email or web address] Password [facility] 09/17/2015, 7:29 AM

## 2015-09-17 NOTE — ED Notes (Signed)
Pt continues to request pain meds.  Notified that her blood sugar at least needs to be wnl before we give her pain meds.

## 2015-09-17 NOTE — ED Notes (Signed)
Patient states that she missed 4 treatments of dialysis ("I don't like going"). Reports that her legs are swelling and causing a lot of pain. Also states that she is here for detox - but requests that the doctor give her "a shot of that morphine or dilaudid or whatever."

## 2015-09-17 NOTE — ED Notes (Signed)
Opened room to round on patient and she is standing in front of the sink, completely nude, washing herself with washcloths. Asked patient why she was bathing, and she reports that she is extremely itchy and "probably needs a bath because she has crawling bugs." No bugs in sight at this time.

## 2015-09-17 NOTE — Procedures (Signed)
  I was present at this dialysis session, have reviewed the session itself and made  appropriate changes Kelly Splinter MD [place] pager [phone]    cell [phone] 09/17/2015, 3:31 PM

## 2015-09-17 NOTE — Consult Note (Signed)
[facility] Consult Note  Referring Provider: No ref. provider found Primary Care Physician:  [name], MD Primary Gastroenterologist:  Dr.  Laurel Dimmer Complaint: Rectal bleeding HPI: [name] is an 50 y.o. black female  with a history of end-stage renal disease and schizophrenia who presents with intermittent rectal bleeding with fairly large volume hematochezia witnessed today, vomiting with no hematemesis. No blood was 5.5 with hemoglobin ostomy one month ago of 10. As of the time of this dictation she has not passed any stool last 3 hours. EGD in 2016 was negative and colonoscopy in 2014 was essentially unrevealing  Past Medical History  Diagnosis Date  . Neuropathy ([place])   . Glaucoma   . Bipolar 1 disorder ([place])   . Refusal of blood transfusions as patient is Jehovah's Witness   . TIA (transient ischemic attack) 2010  . MRSA infection ?2014    "on the back of my head; spread throughout my bloodstream"  . Bipolar disorder, unspecified ([place]) 02/06/2014  . High cholesterol   . 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 Hepatitis C   . Chronic pain syndrome   . ESRD on hemodialysis ([place])   . Schizoaffective disorder, unspecified condition 02/08/2014  . Seizure [facility]) dx'd 2014    "don't know what kind; last one was ~ 04/2014"  . Chronic combined systolic and diastolic CHF (congestive heart failure) (HCC)     EF 40% by echo and 48% by stress test  . Anemia   . Hypertension   . Depression   . Anxiety   . GERD (gastroesophageal reflux disease)   . History of hiatal hernia   . Headache     sinus headaches  . History of vitamin D deficiency 06/20/2015  . Stroke [facility])     TIA 2009    Past Surgical History  Procedure Laterality Date  . Amputation Left 05/21/2013    Procedure: Left Midfoot AMPUTATION;  Surgeon: Newt Minion, MD;  Location: [place];  Service: Orthopedics;  Laterality: Left;  Left Midfoot Amputation  . Tubal ligation    . Colonoscopy  with propofol N/A 06/22/2013    Procedure: COLONOSCOPY WITH PROPOFOL;  Surgeon: Jeryl Columbia, MD;  Location: [facility] ENDOSCOPY;  Service: Endoscopy;  Laterality: N/A;  . Abdominal hysterectomy  2014  . Tonsillectomy  1970's  . Refractive surgery Bilateral 2013  . Tubal ligation  2000  . Abdominal hysterectomy  2010  . Foot amputation Left     due diabetic neuropathy, could not feel ulcer on bottom of foot  . Eye surgery Bilateral 2010    Lasik  . Av fistula placement Left 05/04/2015    Procedure: ARTERIOVENOUS (AV) GRAFT INSERTION;  Surgeon: Angelia Mould, MD;  Location: [place];  Service: Vascular;  Laterality: Left;  . Esophagogastroduodenoscopy N/A 05/11/2015    Procedure: ESOPHAGOGASTRODUODENOSCOPY (EGD);  Surgeon: Laurence Spates, MD;  Location: [facility] ENDOSCOPY;  Service: Endoscopy;  Laterality: N/A;     (Not in a hospital admission)  Allergies:  Allergies  Allergen Reactions  . Gabapentin Itching and Swelling  . Gabapentin Swelling  . Lyrica [Pregabalin] Swelling    Facial and leg swelling  . Other     NO BLOOD PRODUCTS. PT IS A JEHOVAH WITNESS  . Penicillins Hives    Has patient had a PCN reaction causing immediate rash, facial/tongue/throat swelling, SOB or lightheadedness with hypotension: Yes Has patient had a PCN reaction causing severe rash involving mucus membranes or  skin necrosis: No Has patient had a PCN reaction that required hospitalization No Has patient had a PCN reaction occurring within the last 10 years: No If all of the above answers are "NO", then may proceed with Cephalosporin use. Pt tolerates cefepime and ceftriaxone  . Saphris [Asenapine] Swelling    Swelling of the face  . Saphris [Asenapine] Swelling    Facial swelling  . Tramadol Swelling    Causes legs to swell  . Tramadol Swelling    Leg swelling    Family History  Problem Relation Age of Onset  . Hypertension Mother   . Hypertension Father     Social History:  reports that she has been  smoking Cigarettes.  She has a 20 pack-year smoking history. She has never used smokeless tobacco. She reports that she uses illicit drugs ("Crack" cocaine and Cocaine). She reports that she does not drink alcohol.  Review of Systems: negative except as above  Blood pressure 119/56, pulse 68, temperature 97.9 F (36.6 C), temperature source Oral, resp. rate 13, SpO2 100 %. Head: Normocephalic, without obvious abnormality, atraumatic Neck: no adenopathy, no carotid bruit, no JVD, supple, symmetrical, trachea midline and thyroid not enlarged, symmetric, no tenderness/mass/nodules Resp: clear to auscultation bilaterally Cardio: regular rate and rhythm, S1, S2 normal, no murmur, click, rub or gallop GI: Abdomen soft nontender nondistended  Extremities: extremities normal, atraumatic, no cyanosis or edema  Results for orders placed or performed during the hospital encounter of 09/17/15 (from the past 48 hour(s))  Comprehensive metabolic panel     Status: Abnormal   Collection Time: 09/17/15  1:07 AM  Result Value Ref Range   Sodium 140 135 - 145 mmol/L   Potassium 6.6 (HH) 3.5 - 5.1 mmol/L    Comment: NO VISIBLE HEMOLYSIS CRITICAL RESULT CALLED TO, READ BACK BY AND VERIFIED WITH: BELCHER,B RN 0209 2.12.17 MCADOO,G    Chloride 96 (L) 101 - 111 mmol/L   CO2 23 22 - 32 mmol/L   Glucose, Bld 80 65 - 99 mg/dL   BUN 85 (H) 6 - 20 mg/dL   Creatinine, Ser 13.19 (H) 0.44 - 1.00 mg/dL   Calcium 8.2 (L) 8.9 - 10.3 mg/dL   Total Protein 6.2 (L) 6.5 - 8.1 g/dL   Albumin 2.9 (L) 3.5 - 5.0 g/dL   AST 32 15 - 41 U/L   ALT 29 14 - 54 U/L   Alkaline Phosphatase 59 38 - 126 U/L   Total Bilirubin 0.9 0.3 - 1.2 mg/dL   GFR calc non Af Amer 3 (L) >60 mL/min   GFR calc Af Amer 3 (L) >60 mL/min    Comment: (NOTE) The eGFR has been calculated using the CKD EPI equation. This calculation has not been validated in all clinical situations. eGFR's persistently <60 mL/min signify possible Chronic  Kidney Disease.    Anion gap 21 (H) 5 - 15    Comment: REPEATED TO VERIFY  CBC     Status: Abnormal   Collection Time: 09/17/15  1:07 AM  Result Value Ref Range   WBC 4.1 4.0 - 10.5 K/uL   RBC 1.89 (L) 3.87 - 5.11 MIL/uL   Hemoglobin 5.5 (LL) 12.0 - 15.0 g/dL    Comment: CRITICAL RESULT CALLED TO, READ BACK BY AND VERIFIED WITH: BELCHER,B RN 09/17/2015 0140 JORDANS REPEATED TO VERIFY SPECIMEN CHECKED FOR CLOTS    HCT 18.0 (L) 36.0 - 46.0 %   MCV 95.2 78.0 - 100.0 fL   MCH 29.1 26.0 - 34.0  pg   MCHC 30.6 30.0 - 36.0 g/dL   RDW 16.7 (H) 11.5 - 15.5 %   Platelets 257 150 - 400 K/uL  Urinalysis, Routine w reflex microscopic (not at [facility])     Status: Abnormal   Collection Time: 09/17/15  1:08 AM  Result Value Ref Range   Color, Urine YELLOW YELLOW   APPearance CLEAR CLEAR   Specific Gravity, Urine 1.013 1.005 - 1.030   pH 8.0 5.0 - 8.0   Glucose, UA NEGATIVE NEGATIVE mg/dL   Hgb urine dipstick SMALL (A) NEGATIVE   Bilirubin Urine NEGATIVE NEGATIVE   Ketones, ur NEGATIVE NEGATIVE mg/dL   Protein, ur >300 (A) NEGATIVE mg/dL   Nitrite NEGATIVE NEGATIVE   Leukocytes, UA NEGATIVE NEGATIVE  Urine rapid drug screen (hosp performed) (Not at [facility])     Status: Abnormal   Collection Time: 09/17/15  1:08 AM  Result Value Ref Range   Opiates NONE DETECTED NONE DETECTED   Cocaine POSITIVE (A) NONE DETECTED   Benzodiazepines NONE DETECTED NONE DETECTED   Amphetamines NONE DETECTED NONE DETECTED   Tetrahydrocannabinol NONE DETECTED NONE DETECTED   Barbiturates NONE DETECTED NONE DETECTED    Comment:        DRUG SCREEN FOR MEDICAL PURPOSES ONLY.  IF CONFIRMATION IS NEEDED FOR ANY PURPOSE, NOTIFY LAB WITHIN 5 DAYS.        LOWEST DETECTABLE LIMITS FOR URINE DRUG SCREEN Drug Class       Cutoff (ng/mL) Amphetamine      1000 Barbiturate      200 Benzodiazepine   309 Tricyclics       407 Opiates          300 Cocaine          300 THC              50   Urine microscopic-add on      Status: Abnormal   Collection Time: 09/17/15  1:08 AM  Result Value Ref Range   Squamous Epithelial / LPF 6-30 (A) NONE SEEN   WBC, UA 0-5 0 - 5 WBC/hpf   RBC / HPF 0-5 0 - 5 RBC/hpf   Bacteria, UA RARE (A) NONE SEEN  Type and screen     Status: None (Preliminary result)   Collection Time: 09/17/15  2:58 AM  Result Value Ref Range   ABO/RH(D) O POS    Antibody Screen NEG    Sample Expiration 09/20/2015    Unit Number [id number]    Blood Component Type RED CELLS,LR    Unit division 00    Status of Unit ISSUED    Transfusion Status OK TO TRANSFUSE    Crossmatch Result Compatible    Unit Number [id number]    Blood Component Type RBC LR PHER1    Unit division 00    Status of Unit ALLOCATED    Transfusion Status OK TO TRANSFUSE    Crossmatch Result Compatible    Unit Number [id number]    Blood Component Type RED CELLS,LR    Unit division 00    Status of Unit ISSUED    Transfusion Status OK TO TRANSFUSE    Crossmatch Result Compatible   Prepare RBC     Status: None   Collection Time: 09/17/15  3:50 AM  Result Value Ref Range   Order Confirmation ORDER PROCESSED BY BLOOD BANK   APTT     Status: None   Collection Time: 09/17/15  4:05 AM  Result Value Ref Range  aPTT 30 24 - 37 seconds  Protime-INR     Status: Abnormal   Collection Time: 09/17/15  4:05 AM  Result Value Ref Range   Prothrombin Time 16.0 (H) 11.6 - 15.2 seconds   INR 1.26 0.00 - 7.61  Basic metabolic panel     Status: Abnormal   Collection Time: 09/17/15  4:39 AM  Result Value Ref Range   Sodium 140 135 - 145 mmol/L   Potassium 5.9 (H) 3.5 - 5.1 mmol/L   Chloride 98 (L) 101 - 111 mmol/L   CO2 24 22 - 32 mmol/L   Glucose, Bld 119 (H) 65 - 99 mg/dL   BUN 89 (H) 6 - 20 mg/dL   Creatinine, Ser 13.07 (H) 0.44 - 1.00 mg/dL   Calcium 8.1 (L) 8.9 - 10.3 mg/dL   GFR calc non Af Amer 3 (L) >60 mL/min   GFR calc Af Amer 3 (L) >60 mL/min    Comment: (NOTE) The eGFR has been calculated using the CKD EPI  equation. This calculation has not been validated in all clinical situations. eGFR's persistently <60 mL/min signify possible Chronic Kidney Disease.    Anion gap 18 (H) 5 - 15  Troponin I     Status: Abnormal   Collection Time: 09/17/15  4:39 AM  Result Value Ref Range   Troponin I 0.06 (H) <0.031 ng/mL    Comment:        PERSISTENTLY INCREASED TROPONIN VALUES IN THE RANGE OF 0.04-0.49 ng/mL CAN BE SEEN IN:       -UNSTABLE ANGINA       -CONGESTIVE HEART FAILURE       -MYOCARDITIS       -CHEST TRAUMA       -ARRYHTHMIAS       -LATE PRESENTING MYOCARDIAL INFARCTION       -COPD   CLINICAL FOLLOW-UP RECOMMENDED.   CBG monitoring, ED     Status: Abnormal   Collection Time: 09/17/15  5:12 AM  Result Value Ref Range   Glucose-Capillary 43 (LL) 65 - 99 mg/dL  CBC     Status: Abnormal   Collection Time: 09/17/15  5:31 AM  Result Value Ref Range   WBC 4.0 4.0 - 10.5 K/uL   RBC 1.68 (L) 3.87 - 5.11 MIL/uL   Hemoglobin 4.8 (LL) 12.0 - 15.0 g/dL    Comment: CRITICAL RESULT CALLED TO, READ BACK BY AND VERIFIED WITH: BELCHER,B RN 09/17/2015 0632 JORDANS REPEATED TO VERIFY    HCT 16.0 (L) 36.0 - 46.0 %   MCV 95.2 78.0 - 100.0 fL   MCH 28.6 26.0 - 34.0 pg   MCHC 30.0 30.0 - 36.0 g/dL   RDW 16.6 (H) 11.5 - 15.5 %   Platelets 194 150 - 400 K/uL  CBG monitoring, ED     Status: Abnormal   Collection Time: 09/17/15  6:50 AM  Result Value Ref Range   Glucose-Capillary 61 (L) 65 - 99 mg/dL  CBG monitoring, ED     Status: Abnormal   Collection Time: 09/17/15  8:14 AM  Result Value Ref Range   Glucose-Capillary 48 (L) 65 - 99 mg/dL   Dg Elbow Complete Right  09/17/2015  CLINICAL DATA:  50 year old female with fall EXAM: RIGHT ELBOW - COMPLETE 3+ VIEW COMPARISON:  Radiograph dated 07/22/2014 FINDINGS: There is an old supracondylar fracture of right humerus with with widening of the fracture line and nonunion. There is fragmentation and non bridging callus along the fracture. There is  approximately 1.5  cm lateral displacement of the distal fracture fragment similar to prior study. No new fracture identified. There is soft tissue swelling of the elbow with a small joint effusion. 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 Stable 1.5 cm lateral displacement of the distal fracture fragment. Electronically Signed   By: Anner Crete M.D.   On: 09/17/2015 03:38   Dg Chest Port 1 View  09/17/2015  CLINICAL DATA:  History of end-stage renal disease, missed multiple dialysis appointments. History of diabetes, hypertension. EXAM: PORTABLE CHEST 1 VIEW COMPARISON:  Chest radiograph November 22, 2014 FINDINGS: Cardiac silhouette is mildly enlarged, mediastinal silhouette is unremarkable. Pulmonary vasculature appears normal. No pleural effusion or focal consolidation. No pneumothorax. Soft tissue planes and included osseous structures are nonsuspicious. IMPRESSION: Mild cardiomegaly, no acute pulmonary process. Electronically Signed   By: Elon Alas M.D.   On: 09/17/2015 02:41   Dg Foot Complete Left  09/17/2015  CLINICAL DATA:  50 year old female with fall. EXAM: LEFT FOOT - COMPLETE 3+ VIEW COMPARISON:  Radiograph dated 16 16 FINDINGS: There is stable appearing transmetatarsal amputation of the left foot. A small bone fragment noted adjacent to the base of the first metatarsal similar to prior study. There is no acute fracture. No dislocation. There is diffuse soft tissue thickening at the amputation site with mild diffuse subcutaneous edema. No soft tissue gas identified. 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 No acute osseous pathology. Electronically Signed   By: Anner Crete M.D.   On: 09/17/2015 03:41    Assessment: Lower GI bleed, etiology unclear based on previous colonoscopy  Plan:  Transfuse, supportive care, dialysis as scheduled. Probable repeat colonoscopy  this admission. Will follow with you. Would keep on clear liquids for now.  Tashya Alberty C 09/17/2015, 8:21 AM  Pager [phone] If no answer or after 5 PM call [phone]

## 2015-09-17 NOTE — Progress Notes (Signed)
PT Cancellation Note  Patient Details Name: [name] VANTASSEL MRN: [id number] DOB: 08/19/1965   Cancelled Treatment:    Reason Eval/Treat Not Completed: Patient not medically ready.  Noted Hgb of 4.8 this am.  Will hold PT and mobility at this time and f/u as appropriate.     Zaleigh Bermingham, Thornton Papas 09/17/2015, 6:50 AM

## 2015-09-17 NOTE — ED Notes (Signed)
Patient becoming more stubborn about staying and receiving blood and other medications. Patient states that she wants more IV pain medicine, yet she initially came for detox. Pt states that none of the pain medication we've given her has worked and she is "fed up with this place for not doing anything." Reassured patient that the blood will be done in about an hour. Pt drifting off to sleep, but wakes up asking for pain medicine again.

## 2015-09-17 NOTE — Progress Notes (Addendum)
PROGRESS NOTE    [name]  [id number]  DOB: 08-Feb-1966  DOA: 09/17/2015 PCP: [name], MD Outpatient Specialists:   Nephrology: Dr. Jefm Bryant course: 50 year old female patient with history of bipolar disorder/schizoaffective, Jehovah's Witness (has consented to blood transfusion this admission), HLD, DM 2/IDDM, hepatitis C, chronic pain syndrome, ESRD on TTS HD, noncompliant with HD (missed multiple-9 of last 12), seizure disorder, chronic combined CHF, anemia, HTN, TIA/stroke, substance abuse/cocaine, presented to [facility] ED on 09/17/15 with rectal bleeding. Hemoglobin 5.5, potassium 6.6, BUN 85, creatinine 13.19. Admitted to step down for management of lower GI bleeding, ESRD and hyperkalemia in the context of missed HD. Nephrology and [facility] consulted. Hemodynamically stable. Transfusing 3 units PRBC. Follow posttransfusion CBC and dialysis per nephrology.  Assessment & Plan:   1. Acute on chronic lower GI bleed: Hemodynamically stable. EGD 05/11/15 by Dr. Laurence Spates was negative. Colonoscopy 06/22/13 by Dr. Watt Climes without signs of GI bleeding. [facility] consulted. Recommend continued clear liquid diet and possible repeat colonoscopy this admission. Continue IV PPI. No NSAIDs and avoid heparin. 2. Acute blood loss anemia complicating anemia of chronic kidney disease: Acute anemia secondary to GI bleeding. Patient is a Sales promotion account executive Witness but has consented to transfusion during this admission. She had received a unit of PRBC in the ED and was supposed to receive additional 2 units subsequently. Follow posttransfusion hemoglobin and transfuse to keep hemoglobin >7 g per DL. Monitor CBCs closely. 3. ESRD on TTS HD/hyperkalemia: Discussed with nephrology and their input appreciated. Missed several outpatient dialysis/noncompliant. Plan for dialysis today. 4. Seizure disorder: Continue seizure precautions and Keppra 5. Bipolar disorder/schizoaffective disorder: Not on  medications at home. No suicidal or homicidal ideations. This may be contribute into her medical noncompliance. 6. DM 2/IDDM with hypoglycemia: DC Lantus. Treat per hypoglycemia protocol. May use sensitive NovoLog SSI as needed. A1c 10.3 on 05/05/15 suggesting poor control. May resume low-dose Lantus when CBG stabilizes or start consistently rising. 7. Chronic combined CHF: Missed several outpatient dialysis. 2-D echo 08/26/14: LVEF 40%. Does not appear extremely volume overloaded. Volume management across dialysis. 8. Polysubstance abuse-tobacco & cocaine: UDS positive for cocaine. Cessation counseled. Nicotine patch. 109. Fall with left foot, right elbow pain: X-ray of right elbow showed chronic supracondylar fracture of the right elbow with widened fracture gap and nonunion similar to prior exam. X-ray of left foot showed stable appearing transmetatarsal amputation. No acute findings. Judicious pain management. 10. Essential hypertension: Mildly uncontrolled. Continue amlodipine and when necessary IV hydralazine.  11. Hyperlipidemia: Statins. 12. Hepatitis C: Outpatient follow-up.    DVT prophylaxis: SCDs  Code Status: Full  Family Communication: None at bedside  Disposition Plan: Admitted to stepdown unit. DC home when medically stable.  Consultants:  Nephrology   [facility]   Procedures:  None   Antimicrobials:  None   Subjective: No rectal bleeding or vomiting since arrival to hospital. Complaints of left foot pain. Discussed with RN in ED, CBG 48 mg per DL this morning. Had received first unit of PRBC and in the process of setting second unit.   Objective: Filed Vitals:   09/17/15 1123 09/17/15 1130 09/17/15 1145 09/17/15 1215  BP:  156/85 144/80   Pulse:  83 81   Temp: 96.5 F (35.8 C)   98.1 F (36.7 C)  TempSrc: Temporal   Oral  Resp:  11 12   SpO2:  100% 98%     Intake/Output Summary (Last 24 hours) at 09/17/15 1237 Last  data filed at 09/17/15 1108  Gross per 24  hour  Intake   1440 ml  Output      0 ml  Net   1440 ml   There were no vitals filed for this visit.  Exam:  General exam: Moderately built and nourished pleasant young female, chronically ill-looking, lying comfortably supine in the gurney in the ED this morning. Respiratory system: Clear. No increased work of breathing. Cardiovascular system: S1 & S2 heard, RRR. No JVD, murmurs, gallops, clicks or pedal edema. telemetry: Sinus rhythm.  Gastrointestinal system: Abdomen is nondistended, soft and nontender. Normal bowel sounds heard. Central nervous system: somnolent but easily arousable and oriented. No focal neurological deficits. Extremities: Symmetric 5 x 5 power.Left forefoot amputation site healed without acute findings. Superficial abrasions on right foot. Bilateral dorsalis pedis pulsations well felt.   Data Reviewed: Basic Metabolic Panel:  Recent Labs Lab 09/17/15 0107 09/17/15 0439  NA 140 140  K 6.6* 5.9*  CL 96* 98*  CO2 23 24  GLUCOSE 80 119*  BUN 85* 89*  CREATININE 13.19* 13.07*  CALCIUM 8.2* 8.1*   Liver Function Tests:  Recent Labs Lab 09/17/15 0107  AST 32  ALT 29  ALKPHOS 59  BILITOT 0.9  PROT 6.2*  ALBUMIN 2.9*   No results for input(s): LIPASE, AMYLASE in the last 168 hours. No results for input(s): AMMONIA in the last 168 hours. CBC:  Recent Labs Lab 09/17/15 0107 09/17/15 0531  WBC 4.1 4.0  HGB 5.5* 4.8*  HCT 18.0* 16.0*  MCV 95.2 95.2  PLT 257 194   Cardiac Enzymes:  Recent Labs Lab 09/17/15 0439  TROPONINI 0.06*   BNP (last 3 results) No results for input(s): PROBNP in the last 8760 hours. CBG:  Recent Labs Lab 09/17/15 0650 09/17/15 0814 09/17/15 0851 09/17/15 1013 09/17/15 1214  GLUCAP 61* 48* 120* 93 73    No results found for this or any previous visit (from the past 240 hour(s)).       Studies: Dg Elbow Complete Right  09/17/2015  CLINICAL DATA:  50 year old female with fall EXAM: RIGHT ELBOW -  COMPLETE 3+ VIEW COMPARISON:  Radiograph dated 07/22/2014 FINDINGS: There is an old supracondylar fracture of right humerus with with widening of the fracture line and nonunion. There is fragmentation and non bridging callus along the fracture. There is approximately 1.5 cm lateral displacement of the distal fracture fragment similar to prior study. No new fracture identified. There is soft tissue swelling of the elbow with a small joint effusion. 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 Stable 1.5 cm lateral displacement of the distal fracture fragment. Electronically Signed   By: Anner Crete M.D.   On: 09/17/2015 03:38   Dg Chest Port 1 View  09/17/2015  CLINICAL DATA:  History of end-stage renal disease, missed multiple dialysis appointments. History of diabetes, hypertension. EXAM: PORTABLE CHEST 1 VIEW COMPARISON:  Chest radiograph November 22, 2014 FINDINGS: Cardiac silhouette is mildly enlarged, mediastinal silhouette is unremarkable. Pulmonary vasculature appears normal. No pleural effusion or focal consolidation. No pneumothorax. Soft tissue planes and included osseous structures are nonsuspicious. IMPRESSION: Mild cardiomegaly, no acute pulmonary process. Electronically Signed   By: Elon Alas M.D.   On: 09/17/2015 02:41   Dg Foot Complete Left  09/17/2015  CLINICAL DATA:  50 year old female with fall. EXAM: LEFT FOOT - COMPLETE 3+ VIEW COMPARISON:  Radiograph dated 16 16 FINDINGS: There is stable  appearing transmetatarsal amputation of the left foot. A small bone fragment noted adjacent to the base of the first metatarsal similar to prior study. There is no acute fracture. No dislocation. There is diffuse soft tissue thickening at the amputation site with mild diffuse subcutaneous edema. No soft tissue gas identified. 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 No acute osseous  pathology. Electronically Signed   By: Anner Crete M.D.   On: 09/17/2015 03:41        Scheduled Meds: . amLODipine  5 mg Oral QHS  . atorvastatin  40 mg Oral QHS  . cholecalciferol  1,000 Units Oral Daily  . [START ON 09/23/2015] darbepoetin (ARANESP) injection - DIALYSIS  200 mcg Intravenous Q Sat-HD  . darbepoetin (ARANESP) injection - DIALYSIS  200 mcg Intravenous Once in dialysis  . ferrous sulfate  325 mg Oral Q breakfast  . folic acid  1 mg Oral Daily  . insulin aspart  0-9 Units Subcutaneous TID WC  . multivitamin with minerals  1 tablet Oral Daily  . niacin  500 mg Oral QHS  . nicotine  21 mg Transdermal Daily  . pantoprazole (PROTONIX) IV  40 mg Intravenous Q12H  . sodium chloride flush  3 mL Intravenous Q12H  . vitamin A  8,000 Units Oral Daily  . vitamin C  2,500 mg Oral Daily   Continuous Infusions: . levETIRAcetam Stopped (09/17/15 0706)    Principal Problem:   GI bleeding Active Problems:   Seizure disorder (HCC)   Bipolar disorder (HCC)   Schizoaffective disorder, unspecified type ([place])   Chronic combined systolic and diastolic CHF (congestive heart failure) (HCC)   Diabetes mellitus with nephropathy (HCC)   Anemia of chronic kidney failure   ESRD (end stage renal disease) on dialysis (HCC)   Chronic pain syndrome   Long term current use of opiate analgesic   Cocaine abuse   History of stroke   Hyperkalemia   GIB (gastrointestinal bleeding)   Fall    Time spent: 40 minutes.    Vernell Leep, MD, FACP, FHM. [facility] Pager [phone]  If 7PM-7AM, please contact night-coverage [email or web address] Password [facility] 09/17/2015, 12:37 PM    LOS: 0 days

## 2015-09-17 NOTE — ED Notes (Signed)
Called dialysis.  They stated they should be ready to dialyse pt at 1430.

## 2015-09-17 NOTE — ED Notes (Signed)
Patient continuing to call out, requesting more pain medication. Admitting MD paged.

## 2015-09-17 NOTE — ED Notes (Signed)
Per Dr. Blaine Hamper, only one unit of blood is to be given before patient has dialysis. The other two units will be given afterward.

## 2015-09-17 NOTE — ED Notes (Signed)
Pt requesting pain meds for chronic L foot pain.

## 2015-09-17 NOTE — ED Notes (Signed)
Nephrology and admitting MD both at bedside. Both informed of critical Hgb of 4.8.

## 2015-09-17 NOTE — ED Notes (Signed)
Patient has now refused the blood. This RN went to ask patient why, and patient states, "I want to eat a sandwich and I if I don't get some IV pain medicine that puts me to sleep, then I'm going to leave." Attempted to explain to patient her absolute need for the blood and why she is NPO right now, and patient stated that she does not care - she "would rather die than lay here like this." Dr. Blaine Hamper made aware and is currently at the bedside talking to patient.

## 2015-09-17 NOTE — Progress Notes (Signed)
PHARMACIST - PHYSICIAN ORDER COMMUNICATION  CONCERNING: P&T Medication Policy on Herbal Medications  DESCRIPTION:  This patient's order for:  Co-Q 10  has been noted.  This product(s) is classified as an "herbal" or natural product. Due to a lack of definitive safety studies or FDA approval, nonstandard manufacturing practices, plus the potential risk of unknown drug-drug interactions while on inpatient medications, the Pharmacy and Therapeutics Committee does not permit the use of "herbal" or natural products of this type within [facility].   ACTION TAKEN: The pharmacy department is unable to verify this order at this time. Please reevaluate patient's clinical condition at discharge and address if the herbal or natural product(s) should be resumed at that time.   Kelvin Cellar, [place] Pager [phone] 09/17/2015 12:23 PM

## 2015-09-17 NOTE — ED Provider Notes (Signed)
By signing my name below, I, [name], attest that this documentation has been prepared under the direction and in the presence of [place], DO.  Electronically Signed: Forrestine [name], ED Scribe. 09/17/2015. 3:02 AM.   TIME SEEN: 2:53 AM   CHIEF COMPLAINT:  Chief Complaint  Patient presents with  . Leg Pain  . Addiction Problem    HPI:  HPI Comments: [name] brought in by EMS is a 50 y.o. female with a PMHx of Bipolar 1 Disorder, IDDM, ESRD on HD, HTN, and stroke who presents to the Emergency Department complaining of constant, ongoing L foot pain onset few days (history of metatarsal amputation). Pt also reports ongoing R elbow pain (history of chronic injury).  Reports she had a mechanical fall several days ago which is what increased her chronic left foot and right elbow pain.  Denies head injury. Not on anticoagulation. Denies numbness, tingling or focal weakness.   [name] admits to missing her dialysis treatment today. Last successful treatment 09/06/15. Denies chest pain or shortness of breath.   States she used crack cocaine yesterday and admits to alcohol consumption earlier today. Pt is requesting detox this evening. Denies known history of esophageal varices.   Reports she has had months of intermittent bright red blood per rectum but noticed increased today. No dizziness. Last endoscopy October 2016 with [name] which was normal.  States she is not sure if she had a colonoscopy.    PCP: [name] Bellows, MD   NEPHROLOGIST: [name] Contras, MD  ROS: See HPI Constitutional: no fever  Eyes: no drainage  ENT: no runny nose   Cardiovascular:  no chest pain  Resp: no SOB  GI: no vomiting. Blood in stools GU: no dysuria Integumentary: no rash  Allergy: no hives  Musculoskeletal: no leg swelling. Positive back pain and arthralgias  Neurological: no slurred speech ROS otherwise negative  PAST MEDICAL HISTORY/PAST SURGICAL HISTORY:  Past  Medical History  Diagnosis Date  . Neuropathy ([place])   . Glaucoma   . Bipolar 1 disorder ([place])   . Refusal of blood transfusions as patient is Jehovah's Witness   . TIA (transient ischemic attack) 2010  . MRSA infection ?2014    "on the back of my head; spread throughout my bloodstream"  . Bipolar disorder, unspecified ([place]) 02/06/2014  . High cholesterol   . 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 Hepatitis C   . Chronic pain syndrome   . ESRD on hemodialysis ([place])   . Schizoaffective disorder, unspecified condition 02/08/2014  . Seizure [facility]) dx'd 2014    "don't know what kind; last one was ~ 04/2014"  . Chronic combined systolic and diastolic CHF (congestive heart failure) (HCC)     EF 40% by echo and 48% by stress test  . Anemia   . Hypertension   . Depression   . Anxiety   . GERD (gastroesophageal reflux disease)   . History of hiatal hernia   . Headache     sinus headaches  . History of vitamin D deficiency 06/20/2015  . Stroke [facility]     TIA 2009    MEDICATIONS:  Prior to Admission medications   Medication Sig Start Date End Date Taking? Authorizing Provider  amLODipine (NORVASC) 5 MG tablet Take 2 tablets (10 mg total) by mouth at bedtime. Patient taking differently: Take 5 mg by mouth at bedtime.  05/12/15  Yes [name] Jansky, MD  atorvastatin (LIPITOR) 40 MG tablet Take 1 tablet (40 mg total) by mouth at bedtime. 05/07/15  Yes [name] Krystal Eaton, MD  Cholecalciferol (VITAMIN D3) 1000 UNITS CAPS Take 1 capsule by mouth daily.   Yes Historical Provider, MD  Coenzyme Q10 (COQ10) 100 MG CAPS Take 1 capsule by mouth daily.   Yes Historical Provider, MD  Cyanocobalamin (B-12) 2500 MCG TABS Take 1 capsule by mouth daily.   Yes Historical Provider, MD  diphenhydrAMINE (BENADRYL) 25 mg capsule Take 1 capsule (25 mg total) by mouth every 6 (six) hours as needed for itching. 05/07/15  Yes [name] Krystal Eaton, MD  ferrous sulfate 325 (65 FE) MG tablet Take 1 tablet (325 mg  total) by mouth daily with breakfast. 06/24/14  Yes [name] Odea, MD  folic acid (FOLVITE) 585 MCG tablet Take 800 mcg by mouth daily.   Yes Historical Provider, MD  insulin aspart (NOVOLOG) 100 UNIT/ML injection Inject 2-10 Units into the skin 3 (three) times daily before meals. SSI: 0-200 = 2 units, 201-250 = 4 units, 251-300 = 6 units, 301-350 = 8 units, 351-400 = 10 units 05/07/15  Yes [name] K Rai, MD  insulin glargine (LANTUS) 100 UNIT/ML injection Inject 0.25 mLs (25 Units total) into the skin at bedtime. 05/07/15  Yes [name] Krystal Eaton, MD  levETIRAcetam (KEPPRA) 500 MG tablet Take 500 mg by mouth every 12 (twelve) hours. 01/25/15  Yes Historical Provider, MD  Multiple Vitamin (MULTIVITAMIN WITH MINERALS) TABS tablet Take 1 tablet by mouth daily.   Yes Historical Provider, MD  mupirocin cream (BACTROBAN) 2 % Apply topically daily. Patient taking differently: Apply 1 application topically daily as needed (itching, skin irritation).  08/27/14  Yes [name] Girt, DO  niacin 500 MG tablet Take 500 mg by mouth at bedtime.   Yes Historical Provider, MD  Oxycodone HCl 10 MG TABS Take 1 tablet (10 mg total) by mouth every 6 (six) hours as needed. 06/19/15  Yes [name] Pointer, MD  polyethylene glycol (MIRALAX / GLYCOLAX) packet Take 17 g by mouth 2 (two) times daily. Mix in 8 oz liquid and drink 08/24/14  Yes Historical Provider, MD  promethazine (PHENERGAN) 12.5 MG tablet Take 1 tablet (12.5 mg total) by mouth every 6 (six) hours as needed for nausea. 07/06/15  Yes [name] Pilcher Abenezer Odonell, PA-C  vitamin A 8000 UNIT capsule Take 8,000 Units by mouth daily.   Yes Historical Provider, MD  Blood Glucose Monitoring Suppl (ACCU-CHEK AVIVA PLUS) W/DEVICE KIT 1 each by Does not apply route 4 (four) times daily - after meals and at bedtime. 12/13/14   [name] Morale, MD  Lancet Devices [facility]) lancets Use as instructed 12/13/14   [name] Morale, MD    ALLERGIES:  Allergies  Allergen Reactions  . Gabapentin  Itching and Swelling  . Gabapentin Swelling  . Lyrica [Pregabalin] Swelling    Facial and leg swelling  . Other     NO BLOOD PRODUCTS. PT IS A JEHOVAH WITNESS  . Penicillins Hives    Has patient had a PCN reaction causing immediate rash, facial/tongue/throat swelling, SOB or lightheadedness with hypotension: Yes Has patient had a PCN reaction causing severe rash involving mucus membranes or skin necrosis: No Has patient had a PCN reaction that required hospitalization No Has patient had a PCN reaction occurring within the last 10 years: No If all of the above answers are "NO", then may proceed with Cephalosporin use. Pt tolerates cefepime and ceftriaxone  . Saphris [Asenapine] Swelling  Swelling of the face  . Saphris [Asenapine] Swelling    Facial swelling  . Tramadol Swelling    Causes legs to swell  . Tramadol Swelling    Leg swelling    SOCIAL HISTORY:  Social History  Substance Use Topics  . Smoking status: Former Smoker -- 1.00 packs/day for 20 years    Types: Cigarettes    Quit date: 03/31/2015  . Smokeless tobacco: Never Used     Comment: "stopped smoking in ~ 2013; restarted last week; stopped again 06/17/2014"  . Alcohol Use: No     Comment: 05/01/15 patient denies alcohol use.    FAMILY HISTORY: Family History  Problem Relation Age of Onset  . Hypertension Mother   . Hypertension Father     EXAM: BP 186/88 mmHg  Pulse 90  Temp(Src) 98 F (36.7 C) (Oral)  Resp 22  SpO2 100%  LMP  (LMP Unknown) CONSTITUTIONAL: Alert and oriented and responds appropriately to questions. Chronically ill-appearing HEAD: Normocephalic, atraumatic EYES: Conjunctivae clear, PERRL, conjunctival pallor ENT: normal nose; no rhinorrhea; moist mucous membranes; pharynx without lesions noted NECK: Supple, no meningismus, no LAD  CARD: RRR; S1 and S2 appreciated; no murmurs, no clicks, no rubs, no gallops RESP: Normal chest excursion without splinting or tachypnea; breath sounds  clear and equal bilaterally; no wheezes, no rhonchi, no rales, no hypoxia or respiratory distress, speaking full sentences ABD/GI: Normal bowel sounds; non-distended; soft, non-tender, no rebound, no guarding, no peritoneal signs RECTAL:  Multiple large external nonthrombosed and nonbleeding hemorrhoids, maroon colored blood in the rectal vault, normal rectal tone BACK:  The back appears normal and is non-tender to palpation, there is no CVA tenderness EXT: Chronic deformity of the right elbow with decreased range of motion this joint with no ecchymosis or swelling. Patient has a left transmetatarsal amputation with no bony deformity but is tender throughout this area. Otherwise Normal ROM in all joints; otherwise extremities are non-tender to palpation; no edema; normal capillary refill; no cyanosis, no calf tenderness or swelling    SKIN: Normal color for age and race; warm NEURO: Moves all extremities equally, sensation to light touch intact diffusely, cranial nerves II through XII intact PSYCH: The patient's mood and manner are appropriate. Grooming and personal hygiene are appropriate.  MEDICAL DECISION MAKING: Patient here with multiple complaints. She is anemic with a hemoglobin of 5.5 and has active rectal bleeding. Hemodynamically stable. 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 Will give 3 units of packed red blood cells and consult gastroenterology.   Patient has also not had dialysis in 10 days. She does not appear significantly volume overloaded. She is minimally hypertensive which is her baseline. We'll allow hypertension given she is having active GI bleeding. Chest x-ray shows no pulmonary edema or infiltrate. I feel she can receive dialysis non-emergently. She is hyperkalemic with potassium of 6.6 but has no EKG changes. She chronically has peaked T waves.   As for patient's fall she is complaining of  worsening right elbow and left foot pain. We'll proceed with x-rays. Denies head injury. No other sign of trauma on exam.  ED PROGRESS: Patient's x-rays of her right elbow and left foot show no acute injury. Discussed with Dr. Blaine Hamper with hospitalist service who agrees on admission. Discussed with Dr. Amedeo Plenty with [facility] gastroenterology. GI will see the patient in the morning. Will admit to step down per hospitalist request. Care transferred  to hospitalist service.  Dr. Blaine Hamper will call nephrology and order medications for hyperkalemia.  I will give calcium although peaked T waves appear chronic. No interval changes.     EKG Interpretation  Date/Time:  Sunday September 17 2015 02:28:37 EST Ventricular Rate:  90 PR Interval:  172 QRS Duration: 111 QT Interval:  419 QTC Calculation: 513 R Axis:   94 Text Interpretation:  Sinus rhythm Borderline right axis deviation Left ventricular hypertrophy Anterior ST elevation, probably due to LVH Prolonged QT interval No significant change since last tracing Confirmed by Harjas Biggins,  DO, Odeth Bry ([id number]) on 09/17/2015 2:32:17 AM        CRITICAL CARE Performed by: Nyra Jabs   Total critical care time: 45 minutes  Critical care time was exclusive of separately billable procedures and treating other patients.  Critical care was necessary to treat or prevent imminent or life-threatening deterioration.  Critical care was time spent personally by me on the following activities: development of treatment plan with patient and/or surrogate as well as nursing, discussions with consultants, evaluation of patient's response to treatment, examination of patient, obtaining history from patient or surrogate, ordering and performing treatments and interventions, ordering and review of laboratory studies, ordering and review of radiographic studies, pulse oximetry and re-evaluation of patient's condition.     I personally performed the services described in this  documentation, which was scribed in my presence. The recorded information has been reviewed and is accurate.   [place], DO 09/17/15 [phone]

## 2015-09-17 NOTE — Consult Note (Signed)
Renal Service Consult Note [place] 09/17/2015 [name] Requesting Physician:  Dr. Tana Coast  Reason for Consult:  ESRD pt with rectal bleeding/ anemia / high K HPI: The patient is a 50 y.o. year-old with hx of bipolar [name]/o, hep C, chronic pain, DM on insulin, seizure [name]/o and ESRD on HD [place]  Has missed multiple HD sessions (9 of last 12 e.g.), stating her reason is that " I don't like it".  She presented with rectal bleeding, Hb 5.5, leg swelling.  No SOB , CP or fevers.  No abd pain/ n/v/[name].  Asked to see for dialysis.   CXR clear. EKG peaked T's and NSR.  Has only been on HD for about 4 months. Has psych history.   Past Medical History  Past Medical History  Diagnosis Date  . Neuropathy ([place])   . Glaucoma   . Bipolar 1 disorder ([place])   . Refusal of blood transfusions as patient is Jehovah's Witness   . TIA (transient ischemic attack) 2010  . MRSA infection ?2014    "on the back of my head; spread throughout my bloodstream"  . Bipolar disorder, unspecified ([place]) 02/06/2014  . High cholesterol   . 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 Hepatitis C   . Chronic pain syndrome   . ESRD on hemodialysis ([place])   . Schizoaffective disorder, unspecified condition 02/08/2014  . Seizure [facility]) dx'[name] 2014    "don't know what kind; last one was ~ 04/2014"  . Chronic combined systolic and diastolic CHF (congestive heart failure) (HCC)     EF 40% by echo and 48% by stress test  . Anemia   . Hypertension   . Depression   . Anxiety   . GERD (gastroesophageal reflux disease)   . History of hiatal hernia   . Headache     sinus headaches  . History of vitamin [name] deficiency 06/20/2015  . Stroke [facility])     TIA 2009   Past Surgical History  Past Surgical History  Procedure Laterality Date  . Amputation Left 05/21/2013    Procedure: Left Midfoot AMPUTATION;  Surgeon: Newt Minion, MD;  Location: [employer];  Service: Orthopedics;  Laterality:  Left;  Left Midfoot Amputation  . Tubal ligation    . Colonoscopy with propofol N/A 06/22/2013    Procedure: COLONOSCOPY WITH PROPOFOL;  Surgeon: Jeryl Columbia, MD;  Location: [facility] ENDOSCOPY;  Service: Endoscopy;  Laterality: N/A;  . Abdominal hysterectomy  2014  . Tonsillectomy  1970's  . Refractive surgery Bilateral 2013  . Tubal ligation  2000  . Abdominal hysterectomy  2010  . Foot amputation Left     due diabetic neuropathy, could not feel ulcer on bottom of foot  . Eye surgery Bilateral 2010    Lasik  . Av fistula placement Left 05/04/2015    Procedure: ARTERIOVENOUS (AV) GRAFT INSERTION;  Surgeon: Angelia Mould, MD;  Location: [place];  Service: Vascular;  Laterality: Left;  . Esophagogastroduodenoscopy N/A 05/11/2015    Procedure: ESOPHAGOGASTRODUODENOSCOPY (EGD);  Surgeon: Laurence Spates, MD;  Location: [facility] ENDOSCOPY;  Service: Endoscopy;  Laterality: N/A;   Family History  Family History  Problem Relation Age of Onset  . Hypertension Mother   . Hypertension Father    Social History  reports that she has been smoking Cigarettes.  She has a 20 pack-year smoking history. She has never used smokeless tobacco. She reports that she uses  illicit drugs ("Crack" cocaine and Cocaine). She reports that she does not drink alcohol. Allergies  Allergies  Allergen Reactions  . Gabapentin Itching and Swelling  . Gabapentin Swelling  . Lyrica [Pregabalin] Swelling    Facial and leg swelling  . Other     NO BLOOD PRODUCTS. PT IS A JEHOVAH WITNESS  . Penicillins Hives    Has patient had a PCN reaction causing immediate rash, facial/tongue/throat swelling, SOB or lightheadedness with hypotension: Yes Has patient had a PCN reaction causing severe rash involving mucus membranes or skin necrosis: No Has patient had a PCN reaction that required hospitalization No Has patient had a PCN reaction occurring within the last 10 years: No If all of the above answers are "NO", then may proceed with  Cephalosporin use. Pt tolerates cefepime and ceftriaxone  . Saphris [Asenapine] Swelling    Swelling of the face  . Saphris [Asenapine] Swelling    Facial swelling  . Tramadol Swelling    Causes legs to swell  . Tramadol Swelling    Leg swelling   Home medications Prior to Admission medications   Medication Sig Start Date End Date Taking? Authorizing Provider  amLODipine (NORVASC) 5 MG tablet Take 2 tablets (10 mg total) by mouth at bedtime. Patient taking differently: Take 5 mg by mouth at bedtime.  05/12/15  Yes Modena Jansky, MD  atorvastatin (LIPITOR) 40 MG tablet Take 1 tablet (40 mg total) by mouth at bedtime. 05/07/15  Yes Ripudeep Krystal Eaton, MD  Cholecalciferol (VITAMIN D3) 1000 UNITS CAPS Take 1 capsule by mouth daily.   Yes Historical Provider, MD  Coenzyme Q10 (COQ10) 100 MG CAPS Take 1 capsule by mouth daily.   Yes Historical Provider, MD  Cyanocobalamin (B-12) 2500 MCG TABS Take 1 capsule by mouth daily.   Yes Historical Provider, MD  diphenhydrAMINE (BENADRYL) 25 mg capsule Take 1 capsule (25 mg total) by mouth every 6 (six) hours as needed for itching. 05/07/15  Yes Ripudeep Krystal Eaton, MD  ferrous sulfate 325 (65 FE) MG tablet Take 1 tablet (325 mg total) by mouth daily with breakfast. 06/24/14  Yes Debbe Odea, MD  folic acid (FOLVITE) 762 MCG tablet Take 800 mcg by mouth daily.   Yes Historical Provider, MD  insulin aspart (NOVOLOG) 100 UNIT/ML injection Inject 2-10 Units into the skin 3 (three) times daily before meals. SSI: 0-200 = 2 units, 201-250 = 4 units, 251-300 = 6 units, 301-350 = 8 units, 351-400 = 10 units 05/07/15  Yes Ripudeep K Rai, MD  insulin glargine (LANTUS) 100 UNIT/ML injection Inject 0.25 mLs (25 Units total) into the skin at bedtime. 05/07/15  Yes Ripudeep Krystal Eaton, MD  levETIRAcetam (KEPPRA) 500 MG tablet Take 500 mg by mouth every 12 (twelve) hours. 01/25/15  Yes Historical Provider, MD  Multiple Vitamin (MULTIVITAMIN WITH MINERALS) TABS tablet Take 1 tablet by  mouth daily.   Yes Historical Provider, MD  mupirocin cream (BACTROBAN) 2 % Apply topically daily. Patient taking differently: Apply 1 application topically daily as needed (itching, skin irritation).  08/27/14  Yes Geradine Girt, DO  niacin 500 MG tablet Take 500 mg by mouth at bedtime.   Yes Historical Provider, MD  Oxycodone HCl 10 MG TABS Take 1 tablet (10 mg total) by mouth every 6 (six) hours as needed. 06/19/15  Yes Milinda Pointer, MD  polyethylene glycol (MIRALAX / GLYCOLAX) packet Take 17 g by mouth 2 (two) times daily. Mix in 8 oz liquid and drink 08/24/14  Yes Historical Provider, MD  promethazine (PHENERGAN) 12.5 MG tablet Take 1 tablet (12.5 mg total) by mouth every 6 (six) hours as needed for nausea. 07/06/15  Yes Jaime Pilcher Ward, PA-C  vitamin A 8000 UNIT capsule Take 8,000 Units by mouth daily.   Yes Historical Provider, MD  Blood Glucose Monitoring Suppl (ACCU-CHEK AVIVA PLUS) W/DEVICE KIT 1 each by Does not apply route 4 (four) times daily - after meals and at bedtime. 12/13/14   Arnoldo Morale, MD  Lancet Devices [facility]) lancets Use as instructed 12/13/14   Arnoldo Morale, MD   Liver Function Tests  Recent Labs Lab 09/17/15 0107  AST 32  ALT 29  ALKPHOS 59  BILITOT 0.9  PROT 6.2*  ALBUMIN 2.9*   No results for input(s): LIPASE, AMYLASE in the last 168 hours. CBC  Recent Labs Lab 09/17/15 0107 09/17/15 0531  WBC 4.1 4.0  HGB 5.5* 4.8*  HCT 18.0* 16.0*  MCV 95.2 95.2  PLT 257 580   Basic Metabolic Panel  Recent Labs Lab 09/17/15 0107 09/17/15 0439  NA 140 140  K 6.6* 5.9*  CL 96* 98*  CO2 23 24  GLUCOSE 80 119*  BUN 85* 89*  CREATININE 13.19* 13.07*  CALCIUM 8.2* 8.1*    Filed Vitals:   09/17/15 0545 09/17/15 0558 09/17/15 0600 09/17/15 0615  BP: 141/75 163/70 131/63 119/60  Pulse: 75 73 72 70  Temp:  97.9 F (36.6 C)    TempSrc:  Oral    Resp: _0 SpO2: 100% 99% 100% 100%   Exam Drowsy young AAF not in distress No  rash, cyanosis or gangrene Sclera anicteric, throat clear No jvd Chest clear bilat no rales RRR no mrg Abd soft ntnd no mass or ascites MS L TMA well healed Ext 2+ pitting edema bilat LE's Neuro drowsy, nonfocal, Ox 3    [facility]  3[facility] 5mn  72.5kg  2/2.5 bath  Hep 5000  LFA AVG 8.2/ 16%/ 295 > mircera 225 last 1/25, venofer 100 mg has had 8 of 9 doses last 2/1 9.3/ 5.2/ 263 > no binder or vit [name] Norvasc 562m coreg 25 mg > BP high, missed 9 of last 12 sessions  Assessment: 1 Rectal bleeding 2 Anemia of CKD/ ABL 3 Hyperkalemia 4 Severe noncompliance w HD 5 Vol overload/ LE edema  6 DM on insulin 7 Hep C 8 Chron pain/ bipolar disorder   Plan - HD today, will give some of the blood transfusions with HD today, get K down.  No heparin. Palliative care consult for GOC.    RoKelly SplinterD CaNewell Rubbermaidager [phone]  cell [phone]/07/2016, 6:42 AM

## 2015-09-17 NOTE — ED Notes (Signed)
Patient here via EMS with complaint of polysubstance abuse and seeking detox, additionally patient has ESRD with missed dialysis x4 treatments.

## 2015-09-18 DIAGNOSIS — G894 Chronic pain syndrome: Secondary | ICD-10-CM

## 2015-09-18 DIAGNOSIS — D62 Acute posthemorrhagic anemia: Secondary | ICD-10-CM

## 2015-09-18 LAB — POCT I-STAT, CHEM 8
BUN: 82 mg/dL — AB (ref 6–20)
BUN: 24 mg/dL — ABNORMAL HIGH (ref 6–20)
CALCIUM ION: 1.14 mmol/L (ref 1.12–1.23)
CHLORIDE: 102 mmol/L (ref 101–111)
CREATININE: 13.3 mg/dL — AB (ref 0.44–1.00)
CREATININE: 3.9 mg/dL — AB (ref 0.44–1.00)
Calcium, Ion: 0.96 mmol/L — ABNORMAL LOW (ref 1.12–1.23)
Chloride: 100 mmol/L — ABNORMAL LOW (ref 101–111)
GLUCOSE: 103 mg/dL — AB (ref 65–99)
GLUCOSE: 98 mg/dL (ref 65–99)
HCT: 29 % — ABNORMAL LOW (ref 36.0–46.0)
HEMATOCRIT: 33 % — AB (ref 36.0–46.0)
HEMOGLOBIN: 11.2 g/dL — AB (ref 12.0–15.0)
HEMOGLOBIN: 9.9 g/dL — AB (ref 12.0–15.0)
POTASSIUM: 3.2 mmol/L — AB (ref 3.5–5.1)
Potassium: 5.6 mmol/L — ABNORMAL HIGH (ref 3.5–5.1)
Sodium: 137 mmol/L (ref 135–145)
Sodium: 142 mmol/L (ref 135–145)
TCO2: 23 mmol/L (ref 0–100)
TCO2: 28 mmol/L (ref 0–100)

## 2015-09-18 LAB — TYPE AND SCREEN
ABO/RH(D): O POS
Antibody Screen: NEGATIVE
UNIT DIVISION: 0
UNIT DIVISION: 0
Unit division: 0

## 2015-09-18 LAB — CBC
HCT: 27.1 % — ABNORMAL LOW (ref 36.0–46.0)
HCT: 28.8 % — ABNORMAL LOW (ref 36.0–46.0)
HEMOGLOBIN: 9 g/dL — AB (ref 12.0–15.0)
Hemoglobin: 8.7 g/dL — ABNORMAL LOW (ref 12.0–15.0)
MCH: 29.4 pg (ref 26.0–34.0)
MCH: 29 pg (ref 26.0–34.0)
MCHC: 32.1 g/dL (ref 30.0–36.0)
MCHC: 31.3 g/dL (ref 30.0–36.0)
MCV: 91.6 fL (ref 78.0–100.0)
MCV: 92.9 fL (ref 78.0–100.0)
PLATELETS: 195 10*3/uL (ref 150–400)
Platelets: 168 10*3/uL (ref 150–400)
RBC: 2.96 MIL/uL — ABNORMAL LOW (ref 3.87–5.11)
RBC: 3.1 MIL/uL — AB (ref 3.87–5.11)
RDW: 16 % — AB (ref 11.5–15.5)
RDW: 16.3 % — ABNORMAL HIGH (ref 11.5–15.5)
WBC: 3.3 10*3/uL — ABNORMAL LOW (ref 4.0–10.5)
WBC: 3.6 10*3/uL — AB (ref 4.0–10.5)

## 2015-09-18 LAB — BASIC METABOLIC PANEL
Anion gap: 12 (ref 5–15)
BUN: 41 mg/dL — ABNORMAL HIGH (ref 6–20)
CALCIUM: 7.8 mg/dL — AB (ref 8.9–10.3)
CO2: 24 mmol/L (ref 22–32)
CREATININE: 8.46 mg/dL — AB (ref 0.44–1.00)
Chloride: 99 mmol/L — ABNORMAL LOW (ref 101–111)
GFR calc non Af Amer: 5 mL/min — ABNORMAL LOW (ref >60)
GFR, EST AFRICAN AMERICAN: 6 mL/min — AB (ref >60)
Glucose, Bld: 308 mg/dL — ABNORMAL HIGH (ref 65–99)
Potassium: 4.3 mmol/L (ref 3.5–5.1)
SODIUM: 135 mmol/L (ref 135–145)

## 2015-09-18 LAB — LEVETIRACETAM LEVEL: Levetiracetam Lvl: NOT DETECTED ug/mL (ref 10.0–40.0)

## 2015-09-18 LAB — GLUCOSE, CAPILLARY
GLUCOSE-CAPILLARY: 75 mg/dL (ref 65–99)
Glucose-Capillary: 248 mg/dL — ABNORMAL HIGH (ref 65–99)
Glucose-Capillary: 185 mg/dL — ABNORMAL HIGH (ref 65–99)
Glucose-Capillary: 261 mg/dL — ABNORMAL HIGH (ref 65–99)

## 2015-09-18 MED ORDER — LEVETIRACETAM 500 MG PO TABS
500.0000 mg | ORAL_TABLET | Freq: Two times a day (BID) | ORAL | Status: DC
  Administered 2015-09-18 – 2015-09-20 (×5): 500 mg via ORAL
  Filled 2015-09-18 (×5): qty 1

## 2015-09-18 MED ORDER — OXYCODONE HCL 5 MG PO TABS
10.0000 mg | ORAL_TABLET | Freq: Four times a day (QID) | ORAL | Status: DC | PRN
  Administered 2015-09-18 – 2015-09-19 (×3): 10 mg via ORAL
  Filled 2015-09-18 (×3): qty 2

## 2015-09-18 MED ORDER — DIPHENHYDRAMINE HCL 12.5 MG/5ML PO ELIX
12.5000 mg | ORAL_SOLUTION | Freq: Three times a day (TID) | ORAL | Status: DC | PRN
  Administered 2015-09-18 – 2015-09-19 (×3): 12.5 mg via ORAL
  Filled 2015-09-18: qty 5
  Filled 2015-09-18 (×3): qty 10
  Filled 2015-09-18: qty 5

## 2015-09-18 MED ORDER — PANTOPRAZOLE SODIUM 40 MG PO TBEC
40.0000 mg | DELAYED_RELEASE_TABLET | Freq: Every day | ORAL | Status: DC
  Administered 2015-09-19 – 2015-09-20 (×2): 40 mg via ORAL
  Filled 2015-09-18 (×2): qty 1

## 2015-09-18 MED ORDER — MORPHINE SULFATE (PF) 2 MG/ML IV SOLN: 2.0000 mg | INTRAVENOUS | Status: DC | PRN

## 2015-09-18 MED ORDER — MORPHINE SULFATE (PF) 2 MG/ML IV SOLN
1.0000 mg | Freq: Four times a day (QID) | INTRAVENOUS | Status: DC | PRN
  Administered 2015-09-18 – 2015-09-20 (×4): 1 mg via INTRAVENOUS
  Filled 2015-09-18 (×4): qty 1

## 2015-09-18 MED ORDER — DARBEPOETIN ALFA 200 MCG/0.4ML IJ SOSY
200.0000 ug | PREFILLED_SYRINGE | INTRAMUSCULAR | Status: DC
  Administered 2015-09-19: 200 ug via INTRAVENOUS
  Filled 2015-09-18: qty 0.4

## 2015-09-18 MED ORDER — NA FERRIC GLUC CPLX IN SUCROSE 12.5 MG/ML IV SOLN
125.0000 mg | INTRAVENOUS | Status: AC
  Administered 2015-09-19: 125 mg via INTRAVENOUS
  Filled 2015-09-18 (×3): qty 10

## 2015-09-18 NOTE — Progress Notes (Signed)
Attempt report 

## 2015-09-18 NOTE — Progress Notes (Signed)
Around 2200 last night pt was going to leave if we did not give her food (she is on a clear liquid diet). She was about to walk out the door.  RN gave her a sandwich and then she went to sleep. She continuously asks for pain medicine but then falls asleep instantly.

## 2015-09-18 NOTE — Progress Notes (Signed)
PROGRESS NOTE    [name]  [id number]  DOB: 1966-07-12  DOA: 09/17/2015 PCP: [name], MD Outpatient Specialists:   Nephrology: Dr. Fleet Contras  Psychiatry: States that she goes to mental health.  Hospital course: 50 year old female patient with history of bipolar disorder/schizoaffective, Jehovah's Witness (has consented to blood transfusion this admission), HLD, DM 2/IDDM, hepatitis C, chronic pain syndrome, ESRD on TTS HD, noncompliant with HD (missed multiple-9 of last 12), seizure disorder, chronic combined CHF, anemia, HTN, TIA/stroke, substance abuse/cocaine, presented to [facility] ED on 09/17/15 with rectal bleeding. Hemoglobin 5.5, potassium 6.6, BUN 85, creatinine 13.19. Admitted to step down for management of lower GI bleeding, ESRD and hyperkalemia in the context of missed HD. Nephrology and [facility] consulted. Hemodynamically stable. Status post 3 units PRBC. Hemoglobin improved. GI advancing diet and have signed off.  Assessment & Plan:   1. Acute on chronic lower GI bleed: Hemodynamically stable. EGD 05/11/15 by Dr. Laurence Spates was negative. Colonoscopy 06/22/13 by Dr. Watt Climes without signs of GI bleeding. [facility] consulted. Patient was initially placed on clear liquid diet. GI initially indicated that they may consider colonoscopy during this hospitalization. However GI has seen again today, no BM's or bleeding since admission, advancing diet and have signed off. Continue PPI. 2. Acute blood loss anemia complicating anemia of chronic kidney disease: Acute anemia secondary to GI bleeding. Patient is a Sales promotion account executive Witness but consented to transfusion during this admission. She received 3 units PRBCs this hospitalization and hemoglobin has improved to 9 g per DL. Follow CBC in a.m. Continue Aranesp and a dose of iron per nephrology. 3. ESRD on TTS HD/hyperkalemia: Missed several outpatient dialysis/ severely noncompliant. Had HD on 2/12 and will be repeated on 2/14. No  heparin. Hyperkalemia resolved. 4. Seizure disorder: Continue seizure precautions and Keppra 5. Bipolar disorder/schizoaffective disorder: Not on medications at home. No suicidal or homicidal ideations. This may be contribute into her medical noncompliance. 6. DM 2/IDDM with hypoglycemia: DC Lantus. May use sensitive NovoLog SSI as needed. A1c 10.3 on 05/05/15 suggesting poor control. May resume low-dose Lantus when CBG stabilizes or start consistently rising. 7. Chronic combined CHF: Missed several outpatient dialysis. 2-D echo 08/26/14: LVEF 40%. Does not appear extremely volume overloaded. Volume management across dialysis. 8. Polysubstance abuse-tobacco & cocaine: UDS positive for cocaine. Cessation counseled. Nicotine patch. 54. Fall with left foot, right elbow pain: X-ray of right elbow showed chronic supracondylar fracture of the right elbow with widened fracture gap and nonunion similar to prior exam. X-ray of left foot showed stable appearing transmetatarsal amputation. No acute findings. Judicious pain management. Will discuss with orthopedics.  10. Essential hypertension: Mildly uncontrolled. Continue amlodipine and when necessary IV hydralazine.  11. Hyperlipidemia: Statins. 12. Hepatitis C: Outpatient follow-up. 30. Adult failure to thrive: Multifactorial. Nephrology recommends palliative care consultation for goals of care-we'll request.     DVT prophylaxis: SCDs  Code Status: Full  Family Communication: None at bedside  Disposition Plan: transfer to medical bed. DC home when medically stable.  Consultants:  Nephrology   [facility] -signed off   Procedures:  HD  Antimicrobials:  None   Subjective: No complaints of rectal bleeding, coffee-ground emesis or vomiting blood. Ongoing intermittent pain issues. Seen this morning and demanding regular food.   Objective: Filed Vitals:   09/18/15 0238 09/18/15 0432 09/18/15 0738 09/18/15 1200  BP: 95/58 145/81 153/82 164/92    Pulse: 83 87 88 97  Temp:  97.8 F (36.6 C) 97.9 F (  36.6 C) 97.8 F (36.6 C)  TempSrc:  Oral Oral Oral  Resp: 16 13 10 14   Height:      Weight:      SpO2: 98% 97% 98%     Intake/Output Summary (Last 24 hours) at 09/18/15 1517 Last data filed at 09/18/15 0943  Gross per 24 hour  Intake    433 ml  Output   2425 ml  Net  -1992 ml   Filed Weights   09/17/15 1849  Weight: 69 kg (152 lb 1.9 oz)    Exam:  patient seen with her female RN in room.  General exam: Moderately built and nourished pleasant young female, chronically ill-looking, lying comfortably supine in bed. Respiratory system: Clear. No increased work of breathing. Cardiovascular system: S1 & S2 heard, RRR. No JVD, murmurs, gallops, clicks or pedal edema. Telemetry: Sinus rhythm.  Gastrointestinal system: Abdomen is nondistended, soft and nontender. Normal bowel sounds heard. Central nervous system: Alert and oriented. No focal neurological deficits. Extremities: Symmetric 5 x 5 power.Left forefoot amputation site healed without acute findings. Superficial abrasions on right foot. Bilateral dorsalis pedis pulsations well felt.   Data Reviewed: Basic Metabolic Panel:  Recent Labs Lab 09/17/15 0107 09/17/15 0439 09/17/15 1444 09/17/15 1726 09/18/15 0430  NA 140 140 137 142 135  K 6.6* 5.9* 5.6* 3.2* 4.3  CL 96* 98* 100* 102 99*  CO2 23 24  --   --  24  GLUCOSE 80 119* 103* 98 308*  BUN 85* 89* 82* 24* 41*  CREATININE 13.19* 13.07* 13.30* 3.90* 8.46*  CALCIUM 8.2* 8.1*  --   --  7.8*   Liver Function Tests:  Recent Labs Lab 09/17/15 0107  AST 32  ALT 29  ALKPHOS 59  BILITOT 0.9  PROT 6.2*  ALBUMIN 2.9*   No results for input(s): LIPASE, AMYLASE in the last 168 hours. No results for input(s): AMMONIA in the last 168 hours. CBC:  Recent Labs Lab 09/17/15 0531  09/17/15 1726 09/17/15 1806 09/17/15 2237 09/18/15 0430 09/18/15 1031  WBC 4.0  --   --  3.3* 4.2 3.3* 3.6*  HGB 4.8*  < > 9.9*  8.9* 10.2* 8.7* 9.0*  HCT 16.0*  < > 29.0* 27.5* 31.6* 27.1* 28.8*  MCV 95.2  --   --  90.2 91.3 91.6 92.9  PLT 194  --   --  182 238 168 195  < > = values in this interval not displayed. Cardiac Enzymes:  Recent Labs Lab 09/17/15 0439 09/17/15 1320 09/17/15 1847  TROPONINI 0.06* 0.05* 0.05*   BNP (last 3 results) No results for input(s): PROBNP in the last 8760 hours. CBG:  Recent Labs Lab 09/17/15 1852 09/17/15 1946 09/17/15 2028 09/18/15 0726 09/18/15 1300  GLUCAP 62* 51* 105* 248* 75    Recent Results (from the past 240 hour(s))  MRSA PCR Screening     Status: None   Collection Time: 09/17/15  6:00 PM  Result Value Ref Range Status   MRSA by PCR NEGATIVE NEGATIVE Final    Comment:        The GeneXpert MRSA Assay (FDA approved for NASAL specimens only), is one component of a comprehensive MRSA colonization surveillance program. It is not intended to diagnose MRSA infection nor to guide or monitor treatment for MRSA infections.          Studies: Dg Elbow Complete Right  09/17/2015  CLINICAL DATA:  50 year old female with fall EXAM: RIGHT ELBOW - COMPLETE 3+ VIEW COMPARISON:  Radiograph dated 07/22/2014 FINDINGS: There is an old supracondylar fracture of right humerus with with widening of the fracture line and nonunion. There is fragmentation and non bridging callus along the fracture. There is approximately 1.5 cm lateral displacement of the distal fracture fragment similar to prior study. No new fracture identified. There is soft tissue swelling of the elbow with a small joint effusion. 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 Stable 1.5 cm lateral displacement of the distal fracture fragment. Electronically Signed   By: Anner Crete M.D.   On: 09/17/2015 03:38   Dg Chest Port 1 View  09/17/2015  CLINICAL DATA:  History of end-stage renal disease, missed multiple dialysis  appointments. History of diabetes, hypertension. EXAM: PORTABLE CHEST 1 VIEW COMPARISON:  Chest radiograph November 22, 2014 FINDINGS: Cardiac silhouette is mildly enlarged, mediastinal silhouette is unremarkable. Pulmonary vasculature appears normal. No pleural effusion or focal consolidation. No pneumothorax. Soft tissue planes and included osseous structures are nonsuspicious. IMPRESSION: Mild cardiomegaly, no acute pulmonary process. Electronically Signed   By: Elon Alas M.D.   On: 09/17/2015 02:41   Dg Abd Portable 1v  09/17/2015  CLINICAL DATA:  Abdominal pain and rectal bleeding. EXAM: PORTABLE ABDOMEN - 1 VIEW COMPARISON:  CT abdomen and pelvis 05/12/2015 FINDINGS: Scattered gas and stool throughout the colon. No small or large bowel distention. Calcifications in the pelvis are likely vascular. No radiopaque stones. Visualized bones appear intact. IMPRESSION: Normal nonobstructive bowel gas pattern. Electronically Signed   By: Lucienne Capers M.D.   On: 09/17/2015 21:15   Dg Foot Complete Left  09/17/2015  CLINICAL DATA:  50 year old female with fall. EXAM: LEFT FOOT - COMPLETE 3+ VIEW COMPARISON:  Radiograph dated 16 16 FINDINGS: There is stable appearing transmetatarsal amputation of the left foot. A small bone fragment noted adjacent to the base of the first metatarsal similar to prior study. There is no acute fracture. No dislocation. There is diffuse soft tissue thickening at the amputation site with mild diffuse subcutaneous edema. No soft tissue gas identified. 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 No acute osseous pathology. Electronically Signed   By: Anner Crete M.D.   On: 09/17/2015 03:41        Scheduled Meds: . amLODipine  5 mg Oral QHS  . atorvastatin  40 mg Oral QHS  . cholecalciferol  1,000 Units Oral Daily  . [START ON 09/19/2015] darbepoetin (ARANESP) injection - DIALYSIS  200 mcg Intravenous Q Tue-HD  . [START ON 09/19/2015]  ferric gluconate (FERRLECIT/NULECIT) IV  125 mg Intravenous Q T,Th,Sa-HD  . folic acid  1 mg Oral Daily  . insulin aspart  0-9 Units Subcutaneous TID WC  . levETIRAcetam  500 mg Intravenous Q12H  . multivitamin with minerals  1 tablet Oral Daily  . niacin  500 mg Oral QHS  . nicotine  21 mg Transdermal Daily  . [START ON 09/19/2015] pantoprazole  40 mg Oral Q0600  . sodium chloride flush  3 mL Intravenous Q12H   Continuous Infusions: . dextrose 5 % and 0.9% NaCl 10 mL/hr at 09/17/15 2245    Principal Problem:   GI bleeding Active Problems:   Seizure disorder (HCC)   Bipolar disorder (HCC)   Schizoaffective disorder, unspecified type (HCC)   Chronic combined systolic and diastolic CHF (congestive heart failure) (HCC)   Diabetes mellitus with nephropathy (HCC)   Anemia of chronic kidney failure   ESRD (end stage  renal disease) on dialysis [facility])   Chronic pain syndrome   Long term current use of opiate analgesic   Cocaine abuse   History of stroke   Hyperkalemia   GIB (gastrointestinal bleeding)   Fall    Time spent: 30 minutes.    Vernell Leep, MD, FACP, FHM. [facility] Pager [phone]  If 7PM-7AM, please contact night-coverage [email or web address] Password [email or web address] 09/18/2015, 3:17 PM    LOS: 1 day

## 2015-09-18 NOTE — Progress Notes (Addendum)
[facility] Rounding Note Subjective:  Falls asleep when I talk to her - then opens her eyes and says "I'm sorry" Can't or won't converse with me other than to say sleepy and no current discomfort Had morphine earlier this AM  Objective Vital signs in last 24 hours: Filed Vitals:   09/17/15 2341 09/18/15 0238 09/18/15 0432 09/18/15 0738  BP: 92/51 95/58 145/81 153/82  Pulse:  83 87 88  Temp: 98.5 F (36.9 C)  97.8 F (36.6 C) 97.9 F (36.6 C)  TempSrc: Oral  Oral Oral  Resp: 14 16 13 10   Height:      Weight:      SpO2: 100% 98% 97% 98%   Weight change:   Intake/Output Summary (Last 24 hours) at 09/18/15 0911 Last data filed at 09/18/15 0600  Gross per 24 hour  Intake   1404 ml  Output   2725 ml  Net  -1321 ml   Physical Exam:  BP 153/82 mmHg  Pulse 88  Temp(Src) 97.9 F (36.6 C) (Oral)  Resp 10  Ht 5\' 4"  (1.626 m)  Wt 69 kg (152 lb 1.9 oz)  BMI 26.10 kg/m2  SpO2 98%  LMP  (LMP Unknown)  Young AAF not in distress Wt of 69 is below outpt EDW of 72.5 (which needs to be lower based on exam) Sclera anicteric No jvd Chest clear  Regular S1S2 No S3 Abd soft ntnd no mass or tenderness MS L TMA well healed Ext 1+ pitting edema bilat LE's Nonfocal, Ox 3   Labs: Basic Metabolic Panel:  Recent Labs Lab 09/17/15 0107 09/17/15 0439 09/18/15 0430  NA 140 140 135  K 6.6* 5.9* 4.3  CL 96* 98* 99*  CO2 23 24 24   GLUCOSE 80 119* 308*  BUN 85* 89* 41*  CREATININE 13.19* 13.07* 8.46*  CALCIUM 8.2* 8.1* 7.8*   Liver Function Tests:  Recent Labs Lab 09/17/15 0107  AST 32  ALT 29  ALKPHOS 59  BILITOT 0.9  PROT 6.2*  ALBUMIN 2.9*   CBC:  Recent Labs Lab 09/17/15 0531 09/17/15 1806 09/17/15 2237 09/18/15 0430  WBC 4.0 3.3* 4.2 3.3*  HGB 4.8* 8.9* 10.2* 8.7*  HCT 16.0* 27.5* 31.6* 27.1*  MCV 95.2 90.2 91.3 91.6  PLT 194 182 238 168   :  Recent Labs Lab 09/17/15 0439 09/17/15 1320 09/17/15 1847  TROPONINI 0.06* 0.05* 0.05*   CBG:  Recent  Labs Lab 09/17/15 1214 09/17/15 1852 09/17/15 1946 09/17/15 2028 09/18/15 0726  GLUCAP 73 62* 51* 105* 248*    Iron Studies:  Recent Labs Lab 09/17/15 1321  IRON 84  TIBC 326   Studies/Results: Dg Elbow Complete Right  09/17/2015  CLINICAL DATA:  50 year old female with fall EXAM: RIGHT ELBOW - COMPLETE 3+ VIEW COMPARISON:  Radiograph dated 07/22/2014 FINDINGS: There is an old supracondylar fracture of right humerus with with widening of the fracture line and nonunion. There is fragmentation and non bridging callus along the fracture. There is approximately 1.5 cm lateral displacement of the distal fracture fragment similar to prior study. No new fracture identified. There is soft tissue swelling of the elbow with a small joint effusion. 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 Stable 1.5 cm lateral displacement of the distal fracture fragment. Electronically Signed   By: Anner Crete M.D.   On: 09/17/2015 03:38   Dg Chest Port 1 View  09/17/2015  CLINICAL DATA:  History of  end-stage renal disease, missed multiple dialysis appointments. History of diabetes, hypertension. EXAM: PORTABLE CHEST 1 VIEW COMPARISON:  Chest radiograph November 22, 2014 FINDINGS: Cardiac silhouette is mildly enlarged, mediastinal silhouette is unremarkable. Pulmonary vasculature appears normal. No pleural effusion or focal consolidation. No pneumothorax. Soft tissue planes and included osseous structures are nonsuspicious. IMPRESSION: Mild cardiomegaly, no acute pulmonary process. Electronically Signed   By: Elon Alas M.D.   On: 09/17/2015 02:41   Dg Abd Portable 1v  09/17/2015  CLINICAL DATA:  Abdominal pain and rectal bleeding. EXAM: PORTABLE ABDOMEN - 1 VIEW COMPARISON:  CT abdomen and pelvis 05/12/2015 FINDINGS: Scattered gas and stool throughout the colon. No small or large bowel distention. Calcifications in the pelvis  are likely vascular. No radiopaque stones. Visualized bones appear intact. IMPRESSION: Normal nonobstructive bowel gas pattern. Electronically Signed   By: Lucienne Capers M.D.   On: 09/17/2015 21:15   Dg Foot Complete Left  09/17/2015  CLINICAL DATA:  50 year old female with fall. EXAM: LEFT FOOT - COMPLETE 3+ VIEW COMPARISON:  Radiograph dated 16 16 FINDINGS: There is stable appearing transmetatarsal amputation of the left foot. A small bone fragment noted adjacent to the base of the first metatarsal similar to prior study. There is no acute fracture. No dislocation. There is diffuse soft tissue thickening at the amputation site with mild diffuse subcutaneous edema. No soft tissue gas identified. 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 No acute osseous pathology. Electronically Signed   By: Anner Crete M.D.   On: 09/17/2015 03:41   Medications: . dextrose 5 % and 0.9% NaCl 10 mL/hr at 09/17/15 2245   . amLODipine  5 mg Oral QHS  . atorvastatin  40 mg Oral QHS  . cholecalciferol  1,000 Units Oral Daily  . [START ON 09/23/2015] darbepoetin (ARANESP) injection - DIALYSIS  200 mcg Intravenous Q Sat-HD  . darbepoetin (ARANESP) injection - DIALYSIS  200 mcg Intravenous Once in dialysis  . ferrous sulfate  325 mg Oral Q breakfast  . folic acid  1 mg Oral Daily  . insulin aspart  0-9 Units Subcutaneous TID WC  . levETIRAcetam  500 mg Intravenous Q12H  . multivitamin with minerals  1 tablet Oral Daily  . niacin  500 mg Oral QHS  . nicotine  21 mg Transdermal Daily  . pantoprazole (PROTONIX) IV  40 mg Intravenous Q12H  . sodium chloride flush  3 mL Intravenous Q12H   Dialysis: [facility] 3h 50min 72.5kg 2/2.5 bath Hep 5000 LFA AVG 8.2/ 16%/ 295 > mircera 225 last 1/25, venofer 100 mg has had 8 of 9 doses last 2/1 9.3/ 5.2/ 263 > no binder or vit D Norvasc 5mg / coreg 25 mg > BP high, missed 9 of last 12 sessions  Assessment: 1. Rectal bleeding -  transfused 2/12 3 units for Hb 5.5 (actually was as low as 4.8). GI says probable colonoscopy this admission. They advised continuing clear liquids. Pt threatening to leave last PM unless got "real food" so RN gave her a sandwich. Not sure what the plan is at this time..  2. Anemia of CKD/ ABL - blood loss plus misses mircera and iron d/t no attendance at HD. 3 units PRBC's. Aranesp 200 per week while here. Was ordered but I can't see where given. Give dose with HD tomorrow then weekly. Give dose of FE (missed at HD and recheck Fe studies pre HD to see if additional doses needed)  3. ESRD TTS [place] with severe noncompliance  w HD (missed 9 of last 12 outpt sessions). Had HD yesterday. Net UF 2 liters. 2K bath. Will be due for HD again tomorrow. Orders written. No heparin.  4. DM on insulin - hypoglycemia during the night 5. Hep C 6. Bipolar disorder/schizoaffective disorder - on no meds 7. Chronic pain disorder - asking for pain meds continuously this adm 8. Jehovah witness (but consented to transfusion this admission 9. Chronic combined CHF. ECHO 08/26/14 LVEF 40% 10. Sz disorder 11. R supracondylar elbow fracture with non-union. Pain mgmt. ? If ortho has seen   12. HTN - meds/volume control 13. HLD - atorvastatin 14. Dialysis non-attendance "I don't like it". (really - nobody does...so not a good reason not to attend) Palliative care consult for [place] would seem reasonable to me....    Jamal Maes, MD [facility] [phone] pager 09/18/2015, 9:11 AM

## 2015-09-18 NOTE — Evaluation (Signed)
Occupational Therapy Evaluation Patient Details Name: [name] MRN: [id number] DOB: [birth year] Today's Date: 09/18/2015    History of Present Illness 50 yo female admitted with rectal bleeding, L foot and R elbow pain s/p fall. PMH: hypertension, hyperlipidemia, diabetes mellitus, GERD, depression, anxiety, anemia, GI bleeding, TIA, stroke, bipolar disorder, schizophrenia, HCV, ESRD-HD (dialysis noncompliance, last dialysis on 08/05/14), seizure, combined systolic and diastolic congestive heart failure   Clinical Impression   PT admitted with rectal bleeding and s/p fall. Pt currently with functional limitiations due to the deficits listed below (see OT problem list). PTA living at home with family (A) and reports multiple falls. Pt reports falling 2 times per day.  Pt will benefit from skilled OT to increase their independence and safety with adls and balance to allow discharge SNF.     Follow Up Recommendations  SNF    Equipment Recommendations       Recommendations for Other Services       Precautions / Restrictions Precautions Precautions: Fall      Mobility Bed Mobility Overal bed mobility: Modified Independent                Transfers Overall transfer level: Needs assistance   Transfers: Sit to/from Stand Sit to Stand: Min assist              Balance                                            ADL Overall ADL's : Needs assistance/impaired     Grooming: Wash/dry face;Oral care;Wash/dry hands;Minimal assistance;Standing       Lower Body Bathing: Min guard;Sit to/from stand           Toilet Transfer: Ambulation;Minimal Patent examiner Details (indicate cue type and reason): LOB ambulating to 3n1           General ADL Comments: provided peri care at sink level with UE support and mesh panties with liner. pt very upset about care and reports she needs to talk to someone because no one has looked at her  rectum. Director notified for the need to provided the patient advocy handbook to patient. Pt made aware and also requesting SW to help with ALF placement     Vision Additional Comments: nystagmus noted at rest. pt reports "sometimes she has double vision" questions upward beating but exam limited by patients attention level and participation   Perception     Praxis      Pertinent Vitals/Pain Pain Assessment:  (dizzy)     Hand Dominance Left   Extremity/Trunk Assessment Upper Extremity Assessment Upper Extremity Assessment: Overall WFL for tasks assessed   Lower Extremity Assessment Lower Extremity Assessment: Defer to PT evaluation   Cervical / Trunk Assessment Cervical / Trunk Assessment: Normal   Communication Communication Communication: No difficulties   Cognition Arousal/Alertness: Lethargic Behavior During Therapy: Flat affect Overall Cognitive Status: No family/caregiver present to determine baseline cognitive functioning                     General Comments       Exercises       Shoulder Instructions      Home Living Family/patient expects to be discharged to:: Private residence Living Arrangements: Other relatives;Other (Comment) (aunt) Available Help at Discharge: Available PRN/intermittently   Home Access: Stairs to enter  Home Layout: One level     Bathroom Shower/Tub: Teacher, early years/pre: Standard     Home Equipment: Cane - single point;Walker - 2 wheels   Additional Comments: falling down , 2x falls per day. reports falling just moving around house and becoming dizzy.       Prior Functioning/Environment Level of Independence: Independent             OT Diagnosis: Generalized weakness;Acute pain   OT Problem List: Decreased strength;Decreased activity tolerance;Decreased safety awareness;Decreased knowledge of use of DME or AE;Decreased knowledge of precautions;Pain   OT Treatment/Interventions:  Self-care/ADL training;Therapeutic exercise;DME and/or AE instruction;Therapeutic activities;Patient/family education;Balance training    OT Goals(Current goals can be found in the care plan section) Acute Rehab OT Goals Patient Stated Goal: to go to an ALF because i keep falling OT Goal Formulation: With patient Time For Goal Achievement: 10/02/15 Potential to Achieve Goals: Good  OT Frequency: Min 2X/week   Barriers to D/C:            Co-evaluation              End of Session Equipment Utilized During Treatment: Gait belt Nurse Communication: Mobility status;Precautions  Activity Tolerance: Other (comment) (limited by dizzines) Patient left: in bed;with call Giebler/phone within reach;with bed alarm set   Time: 1201-1225 OT Time Calculation (min): 24 min Charges:  OT General Charges $OT Visit: 1 Procedure OT Evaluation $OT Eval Moderate Complexity: 1 Procedure G-Codes:    Parke Poisson B 2015-10-03, 3:50 PM    Jeri Modena   OTR/L Pager: [phone] Office: [phone] .

## 2015-09-18 NOTE — Progress Notes (Signed)
Report given to Monica, RN.

## 2015-09-18 NOTE — Progress Notes (Signed)
Hgb basically stable.  Per nursing, no BM's or bleeding since adm.  Have d/c'd q 6 hr bld draws (will check cbc in a.m), advanced diet, and changed Protonix to po.  Will sign off; please call if we can be of further assistance.  Cleotis Nipper, M.D. Pager [phone] If no answer or after 5 PM call [phone]

## 2015-09-18 NOTE — Evaluation (Signed)
Physical Therapy Evaluation Patient Details Name: [name] MRN: [id number] DOB: 07-Dec-1965 Today's Date: 09/18/2015   History of Present Illness  50 yo female admitted with rectal bleeding, L foot and R elbow pain s/p fall. PMH: hypertension, hyperlipidemia, diabetes mellitus, GERD, depression, anxiety, anemia, GI bleeding, TIA, stroke, bipolar disorder, schizophrenia, HCV, ESRD-HD (dialysis noncompliance, last dialysis on 08/05/14), seizure, combined systolic and diastolic congestive heart failure  Clinical Impression  Pt admitted with above diagnosis. Pt currently with functional limitations due to the deficits listed below (see PT Problem List). Pt was able to ambulate in room but unsteady.  Reports falls at home and one minute states she lives with aunt and next minute states she needs A living.  Pt will need 24 hour care on d/c so recommend SNF and transition to A living as pt tolerates.  Will follow acutely.   Pt will benefit from skilled PT to increase their independence and safety with mobility to allow discharge to the venue listed below.      Follow Up Recommendations SNF;Supervision/Assistance - 24 hour    Equipment Recommendations  Other (comment) (tBA)    Recommendations for Other Services       Precautions / Restrictions Precautions Precautions: Fall Restrictions Weight Bearing Restrictions: No      Mobility  Bed Mobility Overal bed mobility: Modified Independent                Transfers Overall transfer level: Needs assistance   Transfers: Sit to/from Stand Sit to Stand: Min assist         General transfer comment: Generally unsteady on her feet.  Needed guard assist due to pt moves quickly with poor safety awareness.   Ambulation/Gait Ambulation/Gait assistance: Min assist;+2 safety/equipment Ambulation Distance (Feet): 23 Feet Assistive device: 1 person hand held assist;None Gait Pattern/deviations: Step-through pattern;Decreased stride  length;Scissoring;Ataxic;Staggering left;Staggering right   Gait velocity interpretation: Below normal speed for age/gender General Gait Details: Pt needed steadying assist with gait as she staggers at times.  Moves quickly and causes unsteady gait.    Stairs            Wheelchair Mobility    Modified Rankin (Stroke Patients Only)       Balance Overall balance assessment: Needs assistance;History of Falls Sitting-balance support: No upper extremity supported;Feet supported Sitting balance-Leahy Scale: Fair     Standing balance support: No upper extremity supported;During functional activity Standing balance-Leahy Scale: Poor Standing balance comment: requiring steadying assist at times due to poor static stance and definitely needs UE support at times.                High Level Balance Comments: cannot withstand challenges to balance.               Pertinent Vitals/Pain Pain Assessment:  (dizzy)  VSS    Home Living Family/patient expects to be discharged to:: Private residence Living Arrangements: Other relatives;Other (Comment) (aunt) Available Help at Discharge: Available PRN/intermittently   Home Access: Level entry     Home Layout: One level Home Equipment: Cane - single point;Walker - 2 wheels Additional Comments: falling down , 2x falls per day. reports falling just moving around house and becoming dizzy.     Prior Function Level of Independence: Independent         Comments: Initially stated she was staying with aunt but at end of session, stated  she needed to talk to SW about going to A living.  Hand Dominance   Dominant Hand: Left    Extremity/Trunk Assessment   Upper Extremity Assessment: Defer to OT evaluation           Lower Extremity Assessment: Generalized weakness      Cervical / Trunk Assessment: Normal  Communication   Communication: No difficulties  Cognition Arousal/Alertness: Lethargic Behavior During  Therapy: Flat affect Overall Cognitive Status: No family/caregiver present to determine baseline cognitive functioning                      General Comments General comments (skin integrity, edema, etc.): Tried to perform vestibular eval due to pt c/o dizziness however was unable to perform as pt was unable to follow commands for testing.  Questionable left beating nystagmus but difficult to assess due to pt declines more testing.      Exercises        Assessment/Plan    PT Assessment Patient needs continued PT services  PT Diagnosis Generalized weakness   PT Problem List Decreased activity tolerance;Decreased balance;Decreased mobility;Decreased strength;Decreased safety awareness;Decreased knowledge of precautions  PT Treatment Interventions DME instruction;Gait training;Functional mobility training;Therapeutic activities;Therapeutic exercise;Balance training;Patient/family education   PT Goals (Current goals can be found in the Care Plan section) Acute Rehab PT Goals Patient Stated Goal: to go to an ALF because i keep falling PT Goal Formulation: With patient Time For Goal Achievement: 10/02/15 Potential to Achieve Goals: Good    Frequency Min 3X/week   Barriers to discharge Decreased caregiver support      Co-evaluation PT/OT/SLP Co-Evaluation/Treatment: Yes Reason for Co-Treatment: For patient/therapist safety PT goals addressed during session: Mobility/safety with mobility         End of Session Equipment Utilized During Treatment: Gait belt Activity Tolerance: Patient limited by fatigue Patient left: in bed;with call [place]/phone within reach;with bed alarm set Nurse Communication: Mobility status         Time: [id number] PT Time Calculation (min) (ACUTE ONLY): 23 min   Charges:   PT Evaluation $PT Eval Moderate Complexity: 1 Procedure PT Treatments $Gait Training: 8-22 mins   PT G CodesIrwin Brakeman F 09-30-15, 4:10 PM [place]  [name],PT Acute Rehabilitation [phone] (pager)

## 2015-09-19 DIAGNOSIS — Z7189 Other specified counseling: Secondary | ICD-10-CM

## 2015-09-19 DIAGNOSIS — Z515 Encounter for palliative care: Secondary | ICD-10-CM

## 2015-09-19 DIAGNOSIS — F329 Major depressive disorder, single episode, unspecified: Secondary | ICD-10-CM

## 2015-09-19 LAB — RENAL FUNCTION PANEL
ALBUMIN: 2.2 g/dL — AB (ref 3.5–5.0)
ANION GAP: 15 (ref 5–15)
BUN: 46 mg/dL — ABNORMAL HIGH (ref 6–20)
CO2: 22 mmol/L (ref 22–32)
Calcium: 8 mg/dL — ABNORMAL LOW (ref 8.9–10.3)
Chloride: 99 mmol/L — ABNORMAL LOW (ref 101–111)
Creatinine, Ser: 9.44 mg/dL — ABNORMAL HIGH (ref 0.44–1.00)
GFR calc Af Amer: 5 mL/min — ABNORMAL LOW (ref >60)
GFR calc non Af Amer: 4 mL/min — ABNORMAL LOW (ref >60)
GLUCOSE: 233 mg/dL — AB (ref 65–99)
PHOSPHORUS: 7.2 mg/dL — AB (ref 2.5–4.6)
POTASSIUM: 4.9 mmol/L (ref 3.5–5.1)
Sodium: 136 mmol/L (ref 135–145)

## 2015-09-19 LAB — GLUCOSE, CAPILLARY
Glucose-Capillary: 179 mg/dL — ABNORMAL HIGH (ref 65–99)
Glucose-Capillary: 203 mg/dL — ABNORMAL HIGH (ref 65–99)
Glucose-Capillary: 187 mg/dL — ABNORMAL HIGH (ref 65–99)
Glucose-Capillary: 189 mg/dL — ABNORMAL HIGH (ref 65–99)
Glucose-Capillary: 213 mg/dL — ABNORMAL HIGH (ref 65–99)

## 2015-09-19 LAB — CBC
HEMATOCRIT: 29.8 % — AB (ref 36.0–46.0)
HEMOGLOBIN: 9.6 g/dL — AB (ref 12.0–15.0)
MCH: 30.4 pg (ref 26.0–34.0)
MCHC: 32.2 g/dL (ref 30.0–36.0)
MCV: 94.3 fL (ref 78.0–100.0)
Platelets: 177 10*3/uL (ref 150–400)
RBC: 3.16 MIL/uL — ABNORMAL LOW (ref 3.87–5.11)
RDW: 15.5 % (ref 11.5–15.5)
WBC: 3.8 10*3/uL — ABNORMAL LOW (ref 4.0–10.5)

## 2015-09-19 LAB — IRON AND TIBC
IRON: 26 ug/dL — AB (ref 28–170)
SATURATION RATIOS: 9 % — AB (ref 10.4–31.8)
TIBC: 281 ug/dL (ref 250–450)
UIBC: 255 ug/dL

## 2015-09-19 MED ORDER — DARBEPOETIN ALFA 200 MCG/0.4ML IJ SOSY
PREFILLED_SYRINGE | INTRAMUSCULAR | Status: AC
  Filled 2015-09-19: qty 0.4

## 2015-09-19 NOTE — Procedures (Signed)
I have personally attended this patient's dialysis session.   L AVF 400 no issues UF goal 2 liters BP stable No heparin (d/t GIB) Tolerating well so far  Jamal Maes, MD [place] Pager 09/19/2015, 8:46 AM

## 2015-09-19 NOTE — Progress Notes (Signed)
[facility] Rounding Note  Subjective:   Awake today and we talked about why she doesn't go to HD Says she hates it - hates that life - family members tell her she should "stay on HD" but then says has no family support at all. We talked about Hospice/Palliative care option - I actually think she would consider - she thanked me for talking frankly with her Says still sees some blood in BM's  Objective Vital signs in last 24 hours: Filed Vitals:   09/19/15 0536 09/19/15 0700 09/19/15 0725 09/19/15 0726  BP: 145/71 190/108 169/93 169/93  Pulse:  104 99 99  Temp:  98.1 F (36.7 C)    TempSrc:  Oral    Resp:  17 21   Height:      Weight:  71.1 kg (156 lb 12 oz)    SpO2:  100%     Weight change: 2.1 kg (4 lb 10.1 oz)  Intake/Output Summary (Last 24 hours) at 09/19/15 0836 Last data filed at 09/18/15 0943  Gross per 24 hour  Intake      3 ml  Output      0 ml  Net      3 ml   Physical Exam:  BP 169/93 mmHg  Pulse 99  Temp(Src) 98.1 F (36.7 C) (Oral)  Resp 21  Ht 5\' 4"  (1.626 m)  Wt 71.1 kg (156 lb 12 oz)  BMI 26.89 kg/m2  SpO2 100%  LMP  (LMP Unknown)  Young AAF NAD Seen on HD Wt 71.1 (69 yesterday) is below outpt EDW of 72.5 (which needs to be lower based on exam) Sclera anicteric No jvd Chest clear  Regular S1S2 No S3 Abd soft ntnd no mass or tenderness MS L TMA well healed but with some scabbing over top from "bumping it" Ext No sig edema Nonfocal, Ox 3   Labs: Basic Metabolic Panel:  Recent Labs Lab 09/17/15 0107 09/17/15 0439 09/17/15 1444 09/17/15 1726 09/18/15 0430  NA 140 140 137 142 135  K 6.6* 5.9* 5.6* 3.2* 4.3  CL 96* 98* 100* 102 99*  CO2 23 24  --   --  24  GLUCOSE 80 119* 103* 98 308*  BUN 85* 89* 82* 24* 41*  CREATININE 13.19* 13.07* 13.30* 3.90* 8.46*  CALCIUM 8.2* 8.1*  --   --  7.8*   Liver Function Tests:  Recent Labs Lab 09/17/15 0107  AST 32  ALT 29  ALKPHOS 59  BILITOT 0.9  PROT 6.2*  ALBUMIN 2.9*   CBC:  Recent  Labs Lab 09/17/15 2237 09/18/15 0430 09/18/15 1031 09/19/15 0627  WBC 4.2 3.3* 3.6* 3.8*  HGB 10.2* 8.7* 9.0* 9.6*  HCT 31.6* 27.1* 28.8* 29.8*  MCV 91.3 91.6 92.9 94.3  PLT 238 168 195 177   :  Recent Labs Lab 09/17/15 0439 09/17/15 1320 09/17/15 1847  TROPONINI 0.06* 0.05* 0.05*   CBG:  Recent Labs Lab 09/18/15 0726 09/18/15 1300 09/18/15 1744 09/18/15 2210 09/19/15 0624  GLUCAP 248* 75 185* 261* 179*    Iron Studies:   Recent Labs Lab 09/17/15 1321  IRON 84  TIBC 326   Studies/Results: Dg Abd Portable 1v  09/17/2015  CLINICAL DATA:  Abdominal pain and rectal bleeding. EXAM: PORTABLE ABDOMEN - 1 VIEW COMPARISON:  CT abdomen and pelvis 05/12/2015 FINDINGS: Scattered gas and stool throughout the colon. No small or large bowel distention. Calcifications in the pelvis are likely vascular. No radiopaque stones. Visualized bones appear intact. IMPRESSION: Normal nonobstructive  bowel gas pattern. Electronically Signed   By: Lucienne Capers M.D.   On: 09/17/2015 21:15   Medications: . dextrose 5 % and 0.9% NaCl 10 mL/hr at 09/17/15 2245   . amLODipine  5 mg Oral QHS  . atorvastatin  40 mg Oral QHS  . cholecalciferol  1,000 Units Oral Daily  . darbepoetin (ARANESP) injection - DIALYSIS  200 mcg Intravenous Q Tue-HD  . ferric gluconate (FERRLECIT/NULECIT) IV  125 mg Intravenous Q T,Th,Sa-HD  . folic acid  1 mg Oral Daily  . insulin aspart  0-9 Units Subcutaneous TID WC  . levETIRAcetam  500 mg Oral BID  . multivitamin with minerals  1 tablet Oral Daily  . niacin  500 mg Oral QHS  . nicotine  21 mg Transdermal Daily  . pantoprazole  40 mg Oral Q0600  . sodium chloride flush  3 mL Intravenous Q12H   Dialysis: [facility] 14min  72.5kg will have lower EDW at discharge 2/2.5 bath Hep 5000 LFA AVG 8.2/ 16%/ 295 > mircera 225 last 1/25, venofer 100 mg has had 8 of 9 doses last 2/1 9.3/ 5.2/ 263 > no binder or vit D Norvasc 5mg / coreg 25 mg > BP high, missed  9 of last 12 outpt sessions  Assessment: 1. Rectal bleeding - transfused 2/12 for 3 units for Hb 5.5 (actually was as low as 4.8). GI initially said probable colonoscopy this admission, but they have now signed off.  Pt says still seeing some blood. Last Hb 9.6. Will see where she ends up. 2. Anemia of CKD/ ABL - blood loss plus misses mircera and iron d/t no attendance at HD. 3 units PRBC's. Aranesp 200 per week while here. Was ordered but I can't see where given. Give dose with HD today then weekly. Give dose of FE (missed at HD and recheck Fe studies pre HD to see if additional doses needed)  3. ESRD [facility] [place] with severe noncompliance w HD (missed 9 of last 12 outpt sessions). Getting HD today - no heparin - 2 K bath - labs pending - on schedule.   4. DM on insulin - per primary 5. Hep C+ 6. Bipolar disorder/schizoaffective disorder - on no meds 7. Chronic pain disorder  8. Jehovah witness (but consented to transfusion this admission) 9. Chronic combined CHF. ECHO 08/26/14 LVEF 40% 10. Sz disorder 11. R supracondylar elbow fracture with non-union. Pain mgmt. ? If ortho has seen   12. HTN - meds/volume control 13. HLD - atorvastatin 14. Dialysis non-attendance "I don't like it". Says she hates it - hates that life - family members tell her she should "stay on HD" but then says has no family support at all - they don't even come around she says. We talked about Hospice/Palliative care option - I actually think she would consider - she thanked me for talking frankly with her.  Palliative care consult for [facility] would seem reasonable to me....    Jamal Maes, MD [facility] [phone] pager 09/19/2015, 8:36 AM

## 2015-09-19 NOTE — Progress Notes (Signed)
Rounded on patient yesterday and she voiced concerns about discharge. She felt like it was unsafe for her to return to her home environment. I made nurse aware and paged MD.  Received voicemail from patient this am stating that she would be unsafe at home and if she returned home she would die.  Paged MD awaiting a return call. Placed a Social Work consult and will make the nurse caring for her today aware as well.

## 2015-09-19 NOTE — Progress Notes (Addendum)
PROGRESS NOTE    [name]  [id number]  DOB: 1966/04/20  DOA: 09/17/2015 PCP: [name], MD Outpatient Specialists:   Nephrology: Dr. Fleet Contras  Psychiatry: States that she goes to mental health.  Hospital course: 50 year old female patient with history of bipolar disorder/schizoaffective, Jehovah's Witness (has consented to blood transfusion this admission), HLD, DM 2/IDDM, hepatitis C, chronic pain syndrome, ESRD on TTS HD, noncompliant with HD (missed multiple-9 of last 12), seizure disorder, chronic combined CHF, anemia, HTN, TIA/stroke, substance abuse/cocaine, presented to [facility] ED on 09/17/15 with rectal bleeding. Hemoglobin 5.5, potassium 6.6, BUN 85, creatinine 13.19. Admitted to step down for management of lower GI bleeding, ESRD and hyperkalemia in the context of missed HD. Nephrology and [facility] GI consulted. Hemodynamically stable. Status post 3 units PRBC. Hemoglobin improved. GI advancing diet and have signed off.  Assessment & Plan:   1. Acute on chronic lower GI bleed: Hemodynamically stable. EGD 05/11/15 by Dr. Laurence Spates was negative. Colonoscopy 06/22/13 by Dr. Watt Climes without signs of GI bleeding. [facility] consulted. Patient was initially placed on clear liquid diet. GI initially indicated that they may consider colonoscopy during this hospitalization. However GI has seen again today, no BM's or bleeding since admission, advancing diet and have signed off. Continue PPI. H&H continue to monitor so far is actually rising. 2. Acute blood loss anemia complicating anemia of chronic kidney disease: Acute anemia secondary to GI bleeding. Patient is a Sales promotion account executive Witness but consented to transfusion during this admission. She received 3 units PRBCs this hospitalization and hemoglobin has improved to 9.6 g per DL. Monitor clinically Continue Aranesp and a dose of iron per nephrology. 3. ESRD on [facility]TS [facility]/hyperkalemia: Missed several outpatient dialysis/ severely  noncompliant. Had HD on 2/12 and will be repeated on 2/14. No heparin. Hyperkalemia resolved. Discussed with nephrologist Dr. Lorrene Reid on 09/19/2015, he recommends getting palliative care involved, palliative care has been called. Patient agreeable to it as well. 4. Seizure disorder: Continue seizure precautions and Keppra. 5. Bipolar disorder/schizoaffective disorder: Not on medications at home. No suicidal or homicidal ideations. This may be contribute into her medical noncompliance. 6. DM 2/IDDM with hypoglycemia: DC Lantus. May use sensitive NovoLog SSI as needed. A1c 10.3 on 05/05/15 suggesting poor control. Resumed low-dose Lantus & ISS.  Lab Results  Component Value Date   HGBA1C 10.3* 05/05/2015    CBG (last 3)   Recent Labs  09/18/15 1744 09/18/15 2210 09/19/15 0624  GLUCAP 185* 261* 179*   7. Chronic combined CHF: Missed several outpatient dialysis. 2-D echo 08/26/14: LVEF 40%. Does not appear extremely volume overloaded. Volume management across dialysis. 8. Polysubstance abuse-tobacco & cocaine: UDS positive for cocaine. Cessation counseled. Nicotine patch. 19. Fall with left foot, right elbow pain: X-ray of right elbow showed chronic supracondylar fracture of the right elbow with widened fracture gap and nonunion similar to prior exam. X-ray of left foot showed stable appearing transmetatarsal amputation. No acute findings. Supportive care, patient to follow with Dr Mardelle Matte for this problem decided previous admit.  10. Essential hypertension: Mildly uncontrolled. Continue amlodipine and when necessary IV hydralazine.  11. Hyperlipidemia: Statins. 12. Hepatitis C: Outpatient follow-up. 13. Adult failure to thrive: Multifactorial. Nephrology recommends palliative care consultation for goals of care-palliative care called, will be seen today. Patient is agreeable for this as well.    DVT prophylaxis: SCDs  Code Status: Full  Family Communication: None at bedside  Disposition Plan:  DC home when medically stable.  Consultants:  Nephrology  [facility] -signed off   Palliative care called  Procedures:  HD  Antimicrobials:  None   Subjective:  Patient had been getting dialysis, denies any headache or chest pain, no abdominal pain, no focal weakness.   Objective: Filed Vitals:   09/19/15 0900 09/19/15 0930 09/19/15 1000 09/19/15 1030  BP: 178/93 175/87 144/80 155/83  Pulse: 98 94 94 99  Temp:      TempSrc:      Resp:      Height:      Weight:      SpO2:       No intake or output data in the 24 hours ending 09/19/15 1100 Filed Weights   09/17/15 1849 09/19/15 0700  Weight: 69 kg (152 lb 1.9 oz) 71.1 kg (156 lb 12 oz)    Exam:   patient seen with her female RN in room.  General exam: Moderately built and nourished pleasant young female, chronically ill-looking, lying comfortably supine in bed. Respiratory system: Clear. No increased work of breathing. Cardiovascular system: S1 & S2 heard, RRR. No JVD, murmurs, gallops, clicks or pedal edema. Telemetry: Sinus rhythm.  Gastrointestinal system: Abdomen is nondistended, soft and nontender. Normal bowel sounds heard. Central nervous system: Alert and oriented. No focal neurological deficits. Extremities: Symmetric 5 x 5 power.Left forefoot amputation site healed without acute findings. Superficial abrasions on right foot. Bilateral dorsalis pedis pulsations well felt.   Data Reviewed: Basic Metabolic Panel:  Recent Labs Lab 09/17/15 0107 09/17/15 0439 09/17/15 1444 09/17/15 1726 09/18/15 0430 09/19/15 0730  NA 140 140 137 142 135 136  K 6.6* 5.9* 5.6* 3.2* 4.3 4.9  CL 96* 98* 100* 102 99* 99*  CO2 23 24  --   --  24 22  GLUCOSE 80 119* 103* 98 308* 233*  BUN 85* 89* 82* 24* 41* 46*  CREATININE 13.19* 13.07* 13.30* 3.90* 8.46* 9.44*  CALCIUM 8.2* 8.1*  --   --  7.8* 8.0*  PHOS  --   --   --   --   --  7.2*   Liver Function Tests:  Recent Labs Lab 09/17/15 0107 09/19/15 0730  AST  32  --   ALT 29  --   ALKPHOS 59  --   BILITOT 0.9  --   PROT 6.2*  --   ALBUMIN 2.9* 2.2*   No results for input(s): LIPASE, AMYLASE in the last 168 hours. No results for input(s): AMMONIA in the last 168 hours. CBC:  Recent Labs Lab 09/17/15 1806 09/17/15 2237 09/18/15 0430 09/18/15 1031 09/19/15 0627  WBC 3.3* 4.2 3.3* 3.6* 3.8*  HGB 8.9* 10.2* 8.7* 9.0* 9.6*  HCT 27.5* 31.6* 27.1* 28.8* 29.8*  MCV 90.2 91.3 91.6 92.9 94.3  PLT 182 238 168 195 177   Cardiac Enzymes:  Recent Labs Lab 09/17/15 0439 09/17/15 1320 09/17/15 1847  TROPONINI 0.06* 0.05* 0.05*   BNP (last 3 results) No results for input(s): PROBNP in the last 8760 hours. CBG:  Recent Labs Lab 09/18/15 0726 09/18/15 1300 09/18/15 1744 09/18/15 2210 09/19/15 0624  GLUCAP 248* 75 185* 261* 179*    Recent Results (from the past 240 hour(s))  MRSA PCR Screening     Status: None   Collection Time: 09/17/15  6:00 PM  Result Value Ref Range Status   MRSA by PCR NEGATIVE NEGATIVE Final    Comment:        The GeneXpert MRSA Assay (FDA approved for NASAL specimens only), is one component of a  comprehensive MRSA colonization surveillance program. It is not intended to diagnose MRSA infection nor to guide or monitor treatment for MRSA infections.      Studies: Dg Abd Portable 1v  09/17/2015  CLINICAL DATA:  Abdominal pain and rectal bleeding. EXAM: PORTABLE ABDOMEN - 1 VIEW COMPARISON:  CT abdomen and pelvis 05/12/2015 FINDINGS: Scattered gas and stool throughout the colon. No small or large bowel distention. Calcifications in the pelvis are likely vascular. No radiopaque stones. Visualized bones appear intact. IMPRESSION: Normal nonobstructive bowel gas pattern. Electronically Signed   By: Lucienne Capers M.D.   On: 09/17/2015 21:15   Scheduled Meds: . amLODipine  5 mg Oral QHS  . atorvastatin  40 mg Oral QHS  . cholecalciferol  1,000 Units Oral Daily  . Darbepoetin Alfa      . darbepoetin  (ARANESP) injection - DIALYSIS  200 mcg Intravenous Q Tue-HD  . ferric gluconate (FERRLECIT/NULECIT) IV  125 mg Intravenous Q T,Th,Sa-HD  . folic acid  1 mg Oral Daily  . insulin aspart  0-9 Units Subcutaneous TID WC  . levETIRAcetam  500 mg Oral BID  . multivitamin with minerals  1 tablet Oral Daily  . niacin  500 mg Oral QHS  . nicotine  21 mg Transdermal Daily  . pantoprazole  40 mg Oral Q0600  . sodium chloride flush  3 mL Intravenous Q12H   Continuous Infusions: . dextrose 5 % and 0.9% NaCl 10 mL/hr at 09/17/15 2245    Principal Problem:   GI bleeding Active Problems:   Seizure disorder (HCC)   Bipolar disorder (HCC)   Schizoaffective disorder, unspecified type (HCC)   Chronic combined systolic and diastolic CHF (congestive heart failure) (HCC)   Diabetes mellitus with nephropathy (HCC)   Anemia of chronic kidney failure   ESRD (end stage renal disease) on dialysis (HCC)   Chronic pain syndrome   Long term current use of opiate analgesic   Cocaine abuse   History of stroke   Hyperkalemia   GIB (gastrointestinal bleeding)   Fall    Time spent: 30 minutes.  Signature  Thurnell Lose M.D on 09/19/2015 at 11:02 AM  Between 7am to 7pm - Pager - [phone], After 7pm go to [email or web address] - password [email or web address]  [phone]    LOS: 2 days

## 2015-09-19 NOTE — Progress Notes (Signed)
Inpatient Diabetes Program Recommendations  AACE/ADA: New Consensus Statement on Inpatient Glycemic Control (2015)  Target Ranges:  Prepandial:   less than 140 mg/dL      Peak postprandial:   less than 180 mg/dL (1-2 hours)      Critically ill patients:  140 - 180 mg/dL   Results for [name], [name] (MRN [id number]) as of 09/19/2015 12:42  Ref. Range 09/19/2015 06:24 09/19/2015 12:19 09/19/2015 12:32  Glucose-Capillary Latest Ref Range: 65-99 mg/dL 179 (H) 203 (H) 187 (H)     Home Insulin: Lantus 25 units QHS              Novolog 2-10 units tid  Current Orders: Novolog Sensitive SSI (0-9 units) TID AC      MD- Please consider restarting a portion of patient's home dose of Lantus-  Lantus 12 units QHS (50% of home dose)     --Will follow patient during hospitalization--  Wyn Quaker RN, MSN, CDE Diabetes Coordinator Inpatient Glycemic Control Team Team Pager: [phone] (8a-5p)

## 2015-09-19 NOTE — Consult Note (Signed)
Consultation Note Date: 09/19/2015   Patient Name: [name]  DOB: [birth year]  MRN: [id number]  Age / Sex: 50 y.o., female  PCP: Maurice Small, MD Referring Physician: Thurnell Lose, MD  Reason for Consultation: Establishing goals of care    Clinical Assessment/Narrative: 50 yo female with ESRD on HD for the past 5 months per her report.  Also h/o bipolar, schizoaffective do, CVA, and hep C.  She was admitted for GI bleeding which has stabilized after receiving 3 units of PRBCs.  The patient is very concerned that she will contract HIV from the transfusion.  When I first walked into the room I observed her walking.  She appeared relatively well so I explained that Palliative Medicine helped with the transition to comfort care and I asked her why I was called to see her.  She told me she wanted to stop hemo-dialysis.  She states that it makes her feel bad and she hates her quality of life on HD.  She described distrust of her medical doctors and believes that she was put on HD so that the nephrologists could make more money.  She states she still urinates and feels her kidneys may get better.  She says she wants to live as long as possible and does not want to change her code status to DNR.  She tells me she wants to stop HD, but feels she will become afraid when she begins to get really sick and not be able to go thru with going to the Hospice home to die.    I asked her about her family and children.  She says she lives with her aunt but has no support at home.  She mentions that she is depressed.  She had a stay at [facility] several months ago, but had no follow up afterward.  She feels she needs treatment for her psychiatric illnesses.  She mentions that she knows she should stop drinking alcohol and smoking and she feels that if she did her kidneys would improve.  I described what will happen to her after she stops  hemo-dialysis.  We discussed going to the Hospice home and over time becoming uremic and going to sleep.  We discussed that she would likely have only weeks to live - months at most if her kidneys still have some function.  I left her my card and a "hard choices" booklet and asked her to contact me if she decided that she truly wanted to stop HD and transition to hospice to pass.  Contacts/Participants in Discussion: the patient, Ms. Ensz Relationship to Patient: self  SUMMARY OF RECOMMENDATIONS  Code Status/Advance Care Planning: Full code   Additional Recommendations (Limitations, Scope, Preferences):  Patient would benefit from outpatient psychiatric treatment and on-going psychological counseling.  Palliative Medicine will follow peripherally.  Please call if we can be of assistance.  Psycho-social/Spiritual:  Support System: Poor  Prognosis: < 2 weeks if she actually stopped hemo-dialysis  Discharge Planning:  To SNF with psychiatry follow up.   Chief Complaint/ Primary Diagnoses: Present on Admission:  . Bipolar disorder ([place]) . Schizoaffective disorder, unspecified type ([place]) . Chronic pain syndrome . Cocaine abuse . Chronic combined systolic and diastolic CHF (congestive heart failure) ([place]) . Diabetes mellitus with nephropathy ([place]) . Anemia of chronic kidney failure . GI bleeding . Hyperkalemia . GIB (gastrointestinal bleeding) . Fall  I have reviewed the medical record, interviewed the patient and family, and examined the patient. The following aspects are pertinent.  Past Medical History  Diagnosis Date  . Neuropathy ([place])   . Glaucoma   . Bipolar 1 disorder ([place])   . Refusal of blood transfusions as patient is Jehovah's Witness   . TIA (transient ischemic attack) 2010  . MRSA infection ?2014    "on the back of my head; spread throughout my bloodstream"  . Bipolar disorder, unspecified ([place]) 02/06/2014  . High cholesterol   . 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 Hepatitis C   . Chronic pain syndrome   . ESRD on hemodialysis ([place])   . Schizoaffective disorder, unspecified condition 02/08/2014  . Seizure [facility]) dx'd 2014    "don't know what kind; last one was ~ 04/2014"  . Chronic combined systolic and diastolic CHF (congestive heart failure) (HCC)     EF 40% by echo and 48% by stress test  . Anemia   . Hypertension   . Depression   . Anxiety   . GERD (gastroesophageal reflux disease)   . History of hiatal hernia   . Headache     sinus headaches  . History of vitamin D deficiency 06/20/2015  . Stroke [facility])     TIA 2009   Social History   Social History  . Marital Status: Unknown    Spouse Name: N/A  . Number of Children: N/A  . Years of Education: N/A   Social History Main Topics  . Smoking status: Current Some Day Smoker -- 1.00 packs/day for 20 years    Types: Cigarettes    Last Attempt to Quit: 03/31/2015  . Smokeless tobacco: Never Used     Comment: "stopped smoking in ~ 2013; restarted last week; stopped again 06/17/2014"  . Alcohol Use: No     Comment: 05/01/15 patient denies alcohol use.  . Drug Use: Yes    Special: "Crack" cocaine, Cocaine     Comment: last smoked crack 11-07-14  . Sexual Activity: Not Currently    Birth Control/ Protection: Abstinence   Other Topics Concern  . None   Social History Narrative   ** Merged History Encounter **       Family History  Problem Relation Age of Onset  . Hypertension Mother   . Hypertension Father    Scheduled Meds: . amLODipine  5 mg Oral QHS  . atorvastatin  40 mg Oral QHS  . cholecalciferol  1,000 Units Oral Daily  . Darbepoetin Alfa      . darbepoetin (ARANESP) injection - DIALYSIS  200 mcg Intravenous Q Tue-HD  . folic acid  1 mg Oral Daily  . insulin aspart  0-9 Units Subcutaneous TID WC  . levETIRAcetam  500 mg Oral BID  . multivitamin with minerals  1 tablet Oral Daily  . niacin  500 mg Oral QHS  . nicotine  21 mg Transdermal  Daily  . pantoprazole  40 mg Oral Q0600  . sodium chloride flush  3 mL Intravenous Q12H   Continuous Infusions: . dextrose 5 % and 0.9% NaCl 10 mL/hr at 09/17/15 2245   PRN Meds:.sodium chloride, sodium chloride, acetaminophen **OR** acetaminophen, alteplase, diphenhydrAMINE, hydrALAZINE, lidocaine (PF), lidocaine-prilocaine, morphine injection, mupirocin cream, oxyCODONE, pentafluoroprop-tetrafluoroeth Medications Prior to Admission:  Prior to Admission medications   Medication Sig Start Date End Date Taking? Authorizing Provider  amLODipine (NORVASC) 5 MG tablet Take 2 tablets (10 mg total) by mouth at bedtime. Patient taking differently: Take 5 mg by mouth at bedtime.  05/12/15  Yes Anand D Hongalgi,  MD  atorvastatin (LIPITOR) 40 MG tablet Take 1 tablet (40 mg total) by mouth at bedtime. 05/07/15  Yes Ripudeep Krystal Eaton, MD  Cholecalciferol (VITAMIN D3) 1000 UNITS CAPS Take 1 capsule by mouth daily.   Yes Historical Provider, MD  Coenzyme Q10 (COQ10) 100 MG CAPS Take 1 capsule by mouth daily.   Yes Historical Provider, MD  Cyanocobalamin (B-12) 2500 MCG TABS Take 1 capsule by mouth daily.   Yes Historical Provider, MD  diphenhydrAMINE (BENADRYL) 25 mg capsule Take 1 capsule (25 mg total) by mouth every 6 (six) hours as needed for itching. 05/07/15  Yes Ripudeep Krystal Eaton, MD  ferrous sulfate 325 (65 FE) MG tablet Take 1 tablet (325 mg total) by mouth daily with breakfast. 06/24/14  Yes Debbe Odea, MD  folic acid (FOLVITE) 161 MCG tablet Take 800 mcg by mouth daily.   Yes Historical Provider, MD  insulin aspart (NOVOLOG) 100 UNIT/ML injection Inject 2-10 Units into the skin 3 (three) times daily before meals. SSI: 0-200 = 2 units, 201-250 = 4 units, 251-300 = 6 units, 301-350 = 8 units, 351-400 = 10 units 05/07/15  Yes Ripudeep K Rai, MD  insulin glargine (LANTUS) 100 UNIT/ML injection Inject 0.25 mLs (25 Units total) into the skin at bedtime. 05/07/15  Yes Ripudeep Krystal Eaton, MD  levETIRAcetam (KEPPRA) 500  MG tablet Take 500 mg by mouth every 12 (twelve) hours. 01/25/15  Yes Historical Provider, MD  Multiple Vitamin (MULTIVITAMIN WITH MINERALS) TABS tablet Take 1 tablet by mouth daily.   Yes Historical Provider, MD  mupirocin cream (BACTROBAN) 2 % Apply topically daily. Patient taking differently: Apply 1 application topically daily as needed (itching, skin irritation).  08/27/14  Yes Geradine Girt, DO  niacin 500 MG tablet Take 500 mg by mouth at bedtime.   Yes Historical Provider, MD  Oxycodone HCl 10 MG TABS Take 1 tablet (10 mg total) by mouth every 6 (six) hours as needed. 06/19/15  Yes Milinda Pointer, MD  polyethylene glycol (MIRALAX / GLYCOLAX) packet Take 17 g by mouth 2 (two) times daily. Mix in 8 oz liquid and drink 08/24/14  Yes Historical Provider, MD  promethazine (PHENERGAN) 12.5 MG tablet Take 1 tablet (12.5 mg total) by mouth every 6 (six) hours as needed for nausea. 07/06/15  Yes Jaime Pilcher Ward, PA-C  vitamin A 8000 UNIT capsule Take 8,000 Units by mouth daily.   Yes Historical Provider, MD  Blood Glucose Monitoring Suppl (ACCU-CHEK AVIVA PLUS) W/DEVICE KIT 1 each by Does not apply route 4 (four) times daily - after meals and at bedtime. 12/13/14   Arnoldo Morale, MD  Lancet Devices [facility]) lancets Use as instructed 12/13/14   Arnoldo Morale, MD   Allergies  Allergen Reactions  . Gabapentin Itching and Swelling  . Gabapentin Swelling  . Lyrica [Pregabalin] Swelling    Facial and leg swelling  . Other     NO BLOOD PRODUCTS. PT IS A JEHOVAH WITNESS  . Penicillins Hives    Has patient had a PCN reaction causing immediate rash, facial/tongue/throat swelling, SOB or lightheadedness with hypotension: Yes Has patient had a PCN reaction causing severe rash involving mucus membranes or skin necrosis: No Has patient had a PCN reaction that required hospitalization No Has patient had a PCN reaction occurring within the last 10 years: No If all of the above answers are "NO",  then may proceed with Cephalosporin use. Pt tolerates cefepime and ceftriaxone  . Saphris [Asenapine] Swelling    Swelling  of the face  . Saphris [Asenapine] Swelling    Facial swelling  . Tramadol Swelling    Causes legs to swell  . Tramadol Swelling    Leg swelling    Review of Systems  Constitutional: Positive for activity change and fatigue.  HENT: Negative.   Eyes: Negative.   Respiratory: Negative.   Cardiovascular: Negative.   Gastrointestinal: Positive for blood in stool.  Endocrine: Negative.   Genitourinary: Negative for difficulty urinating.  Musculoskeletal: Negative.   Skin: Negative.   Neurological: Positive for weakness.  Hematological: Negative.   Psychiatric/Behavioral: Positive for dysphoric mood and agitation.    Physical Exam  Wd female, up hobbling from the door to her bed.   Resp:  No increased work of breathing. Psych:  Patient reports depression,  A&O, expresses distrust for her physicians Extremities:  Right elbow swelling.  Left lower transmetatarsal amputation   Vital Signs: BP 159/75 mmHg  Pulse 105  Temp(Src) 98.5 F (36.9 C) (Oral)  Resp 160  Ht '5\' 4"'$  (1.626 m)  Wt 68.6 kg (151 lb 3.8 oz)  BMI 25.95 kg/m2  SpO2 100%  LMP  (LMP Unknown)  SpO2: SpO2: 100 % O2 Device:SpO2: 100 % O2 Flow Rate: .   IO: Intake/output summary:   Intake/Output Summary (Last 24 hours) at 09/19/15 9678 Last data filed at 09/19/15 1216  Gross per 24 hour  Intake    240 ml  Output   1898 ml  Net  -1658 ml    LBM: Last BM Date: 09/17/15 Baseline Weight: Weight: 69 kg (152 lb 1.9 oz) Most recent weight: Weight: 68.6 kg (151 lb 3.8 oz)      Palliative Assessment/Data:  Flowsheet Rows        Most Recent Value   Intake Tab    Referral Department  Nephrology   Unit at Time of Referral  Med/Surg Unit   Palliative Care Primary Diagnosis  Nephrology   Date Notified  09/18/15   Palliative Care Type  New Palliative care   Reason for referral  Non-pain  Symptom, Clarify Goals of Care   Date of Admission  09/17/15   Date first seen by Palliative Care  09/19/15   # of days Palliative referral response time  1 Day(s)   # of days IP prior to Palliative referral  1   Clinical Assessment    Palliative Performance Scale Score  60%   Anxiety Max Last 24 Hours  8   Anxiety Min Last 24 Hours  3   Psychosocial & Spiritual Assessment    Palliative Care Outcomes    Patient/Family meeting held?  Yes   Who was at the meeting?  Patient   Palliative Care Outcomes  Clarified goals of care      Additional Data Reviewed:  CBC:    Component Value Date/Time   WBC 3.8* 09/19/2015 0627   WBC 5.0 07/20/2013 1456   HGB 9.6* 09/19/2015 0627   HGB 9.3* 07/20/2013 1456   HCT 29.8* 09/19/2015 0627   HCT 28.8* 07/20/2013 1456   PLT 177 09/19/2015 0627   PLT 271 07/20/2013 1456   MCV 94.3 09/19/2015 0627   MCV 90.5 07/20/2013 1456   NEUTROABS 2.7 05/10/2015 0011   NEUTROABS 2.3 07/20/2013 1456   LYMPHSABS 0.4* 05/10/2015 0011   LYMPHSABS 2.3 07/20/2013 1456   MONOABS 0.3 05/10/2015 0011   MONOABS 0.3 07/20/2013 1456   EOSABS 0.1 05/10/2015 0011   EOSABS 0.1 07/20/2013 1456   BASOSABS 0.0 05/10/2015 0011  BASOSABS 0.0 07/20/2013 1456   Comprehensive Metabolic Panel:    Component Value Date/Time   NA 136 09/19/2015 0730   K 4.9 09/19/2015 0730   CL 99* 09/19/2015 0730   CO2 22 09/19/2015 0730   BUN 46* 09/19/2015 0730   CREATININE 9.44* 09/19/2015 0730   GLUCOSE 233* 09/19/2015 0730   CALCIUM 8.0* 09/19/2015 0730   AST 32 09/17/2015 0107   ALT 29 09/17/2015 0107   ALKPHOS 59 09/17/2015 0107   BILITOT 0.9 09/17/2015 0107   PROT 6.2* 09/17/2015 0107   ALBUMIN 2.2* 09/19/2015 0730     Time In: 2:30 Time Out: 3:20 Time Total: 50 min. Greater than 50%  of this time was spent counseling and coordinating care related to the above assessment and plan.  Signed by:  Imogene Burn, PA-C Palliative Medicine Pager:  [phone]   09/19/2015, 6:52 PM  Please contact Palliative Medicine Team phone at [phone] for questions and concerns.

## 2015-09-20 ENCOUNTER — Other Ambulatory Visit: Payer: Self-pay | Admitting: *Deleted

## 2015-09-20 DIAGNOSIS — E1121 Type 2 diabetes mellitus with diabetic nephropathy: Secondary | ICD-10-CM

## 2015-09-20 DIAGNOSIS — N185 Chronic kidney disease, stage 5: Secondary | ICD-10-CM

## 2015-09-20 DIAGNOSIS — D631 Anemia in chronic kidney disease: Secondary | ICD-10-CM

## 2015-09-20 LAB — GLUCOSE, CAPILLARY
GLUCOSE-CAPILLARY: 164 mg/dL — AB (ref 65–99)
GLUCOSE-CAPILLARY: 314 mg/dL — AB (ref 65–99)
Glucose-Capillary: 175 mg/dL — ABNORMAL HIGH (ref 65–99)
Glucose-Capillary: 173 mg/dL — ABNORMAL HIGH (ref 65–99)

## 2015-09-20 MED ORDER — BISACODYL 10 MG RE SUPP: 10.0000 mg | Freq: Every day | RECTAL | Status: DC | PRN

## 2015-09-20 MED ORDER — OXYCODONE HCL 10 MG PO TABS: 10.0000 mg | ORAL_TABLET | Freq: Four times a day (QID) | ORAL | Status: DC | PRN

## 2015-09-20 MED ORDER — HYDROCORTISONE ACETATE 25 MG RE SUPP
25.0000 mg | Freq: Two times a day (BID) | RECTAL | Status: DC
  Filled 2015-09-20 (×2): qty 1

## 2015-09-20 MED ORDER — INSULIN GLARGINE 100 UNIT/ML ~~LOC~~ SOLN
5.0000 [IU] | Freq: Every day | SUBCUTANEOUS | Status: DC
Start: 2015-09-20 — End: 2016-01-08

## 2015-09-20 MED ORDER — SORBITOL 70 % SOLN
30.0000 mL | Freq: Once | Status: AC
  Administered 2015-09-20: 30 mL via ORAL
  Filled 2015-09-20: qty 30

## 2015-09-20 MED ORDER — ONDANSETRON HCL 4 MG/2ML IJ SOLN
INTRAMUSCULAR | Status: AC
  Administered 2015-09-20: 13:00:00
  Filled 2015-09-20: qty 2

## 2015-09-20 MED ORDER — ACETAMINOPHEN 325 MG PO TABS: 650.0000 mg | ORAL_TABLET | Freq: Four times a day (QID) | ORAL | Status: DC | PRN

## 2015-09-20 MED ORDER — INSULIN ASPART 100 UNIT/ML ~~LOC~~ SOLN: 0.0000 [IU] | Freq: Three times a day (TID) | SUBCUTANEOUS | Status: DC

## 2015-09-20 MED ORDER — SODIUM CHLORIDE 0.9 % IV SOLN: 125.0000 mg | INTRAVENOUS | Status: DC

## 2015-09-20 MED ORDER — AMLODIPINE BESYLATE 10 MG PO TABS: 10.0000 mg | ORAL_TABLET | Freq: Every day | ORAL | Status: DC

## 2015-09-20 MED ORDER — ONDANSETRON HCL 4 MG/2ML IJ SOLN
4.0000 mg | Freq: Once | INTRAMUSCULAR | Status: AC
  Administered 2015-09-20: 4 mg via INTRAVENOUS

## 2015-09-20 NOTE — Clinical Social Work Placement (Signed)
   CLINICAL SOCIAL WORK PLACEMENT  NOTE  Date:  09/20/2015  Patient Details  Name: [name] MRN: [id number] Date of Birth: April 02, 1966  Clinical Social Work is seeking post-discharge placement for this patient at the [place] level of care (*CSW will initial, date and re-position this form in  chart as items are completed):  Yes   Patient/family provided with [place] Work Department's list of facilities offering this level of care within the geographic area requested by the patient (or if unable, by the patient's family).  Yes   Patient/family informed of their freedom to choose among providers that offer the needed level of care, that participate in Medicare, Medicaid or managed care program needed by the patient, have an available bed and are willing to accept the patient.  Yes   Patient/family informed of [place]'s ownership interest in [facility] and [facility], as well as of the fact that they are under no obligation to receive care at these facilities.  PASRR submitted to EDS on       PASRR number received on       Existing PASRR number confirmed on 09/20/15     FL2 transmitted to all facilities in geographic area requested by pt/family on 09/20/15     FL2 transmitted to all facilities within larger geographic area on 09/20/15     Patient informed that his/her managed care company has contracts with or will negotiate with certain facilities, including the following:        Yes   Patient/family informed of bed offers received.  Patient chooses bed at       Physician recommends and patient chooses bed at      Patient to be transferred to   on  .  Patient to be transferred to facility by       Patient family notified on   of transfer.  Name of family member notified:        PHYSICIAN Please sign FL2, Please prepare prescriptions     Additional Comment:    _______________________________________________ Dulcy Fanny, LCSW 09/20/2015, 9:12 AM

## 2015-09-20 NOTE — Clinical Social Work Note (Signed)
Clinical Social Work Assessment  Patient Details  Name: [name] MRN: [id number] Date of Birth: 12/11/1965  Date of referral:  09/20/15               Reason for consult:  Facility Placement                Permission sought to share information with:  Facility Contact Representative Permission granted to share information::  Yes, Verbal Permission Granted  Name::        Agency::   ([facility] [place] [facility])  Relationship::     Contact Information:     Housing/Transportation Living arrangements for the past 2 months:  Skilled Nursing Facility Source of Information:  Patient Patient Interpreter Needed:  None Criminal Activity/Legal Involvement Pertinent to Current Situation/Hospitalization:  No - Comment as needed Significant Relationships:  Other Family Members Lives with:    Do you feel safe going back to the place where you live?  No Need for family participation in patient care:  No (Coment)  Care giving concerns:  No caregivers present at time of discharge   Social Worker assessment / plan:  CSW met with patient at bedside to introduce self and role.  PT is recommending SNF at time of discharge for STR.  Patient is aware and agreeable to these plans.  Patient states she is from home with family and is able to return once discharged.  Patient states she will need PTAR transportation to [facility].  CSW will arrange.    Employment status:  Disabled (Comment on whether or not currently receiving Disability) Insurance information:  Medicare PT Recommendations:  Skilled Nursing Facility Information / Referral to community resources:  Skilled Nursing Facility  Patient/Family's Response to care:  Patient is agreeable to SNF.  Patient/Family's Understanding of and Emotional Response to Diagnosis, Current Treatment, and Prognosis:  Patient is realistic regarding level of care needed at time of discharge.  CSW provided education regarding SA and self  harm being non-compliant with HD tx plans.  Patient receptive to information and education.    Emotional Assessment Appearance:  Appears older than stated age Attitude/Demeanor/Rapport:   (appropriate) Affect (typically observed):  Accepting, Adaptable Orientation:  Oriented to Self, Oriented to Place, Oriented to  Time, Oriented to Situation Alcohol / Substance use:  Not Applicable Psych involvement (Current and /or in the community):  No (Comment)  Discharge Needs  Concerns to be addressed:  No discharge needs identified Readmission within the last 30 days:  No Current discharge risk:  None Barriers to Discharge:  No Barriers Identified   ,  C, LCSW 09/20/2015, 11:43 AM  

## 2015-09-20 NOTE — Consult Note (Signed)
   [facility] CM Inpatient Consult   09/20/2015  [name] [birth year] [id number]   Patient screened for [place] Management services. Went to bedside to discuss and offer [place] Management services. She is agreeable and written consent signed. Reports she is going to [facility] today. She states this will likely be short term.  [facility] Care Management packet along with disclosure letter and contact information left at bedside. Explained that [place] Management will not interfere or replace services provided by home health or services provided at SNF. Confirmed Primary Care MD as Dr. Arbie Cookey Web. Confirmed best contact number as [phone] and that her cell phone will be turned back on on 09/24/15.Explained that [facility] Licensed CSW will follow up while SNF. Will request for patient to be assigned to [facility] Licensed and to [place] for when patient returns home. Made inpatient RNCM aware.   [name], MSN-Ed, RN,BSN [facility] Liaison [phone]

## 2015-09-20 NOTE — Care Management Important Message (Signed)
Important Message  Patient Details  Name: [name] MRN: [id number] Date of Birth: 12-Jul-1966   Medicare Important Message Given:  Yes    Devinn Hurwitz P April Colter 09/20/2015, 5:10 PM

## 2015-09-20 NOTE — Discharge Planning (Signed)
Report called to [place] at [place].

## 2015-09-20 NOTE — Clinical Social Work Note (Addendum)
CSW contacted [place] [phone] to confirm patient is active. Patient last HD was 09/06/2015.  Patient is agreeable to SNF and has a bed at [facility] today.  CSW contacted [facility] to make sure they were aware that the patient needed transportation to HD on MWF.  SNF is aware and agreeable to transport.    Patient will discharge today per MD order. Patient will discharge to: [facility] RN to call report prior to transportation to: [phone] Transportation: PTAR- to be scheduled at 12:30pm  CSW sent discharge summary to SNF for review.  Packet is complete.  RN, patient and family aware of discharge plans.  Nonnie Done, LCSW [phone]  5N1-9, 2S 15-16 and Psychiatric Service Line  Licensed Clinical Social Worker

## 2015-09-20 NOTE — Clinical Social Work Placement (Signed)
   CLINICAL SOCIAL WORK PLACEMENT  NOTE  Date:  09/20/2015  Patient Details  Name: [name] MRN: [id number] Date of Birth: March 11, 1966  Clinical Social Work is seeking post-discharge placement for this patient at the [place] level of care (*CSW will initial, date and re-position this form in  chart as items are completed):  Yes   Patient/family provided with [place] Work Department's list of facilities offering this level of care within the geographic area requested by the patient (or if unable, by the patient's family).  Yes   Patient/family informed of their freedom to choose among providers that offer the needed level of care, that participate in Medicare, Medicaid or managed care program needed by the patient, have an available bed and are willing to accept the patient.  Yes   Patient/family informed of 's ownership interest in [facility] and [facility], as well as of the fact that they are under no obligation to receive care at these facilities.  PASRR submitted to EDS on       PASRR number received on       Existing PASRR number confirmed on 09/20/15     FL2 transmitted to all facilities in geographic area requested by pt/family on 09/20/15     FL2 transmitted to all facilities within larger geographic area on 09/20/15     Patient informed that his/her managed care company has contracts with or will negotiate with certain facilities, including the following:        Yes   Patient/family informed of bed offers received.  [place] [facility] [place] at [place] recommends and patient chooses bed at      Patient to be transferred to [facility] on 09/20/15.  Patient to be transferred to facility by PTAR     Patient family notified on 09/20/15 of transfer.  Name of family member notified:  patient is alert and oriented x4.  No family was contacted- patient states she will  contact family and update as necessary     PHYSICIAN Please sign FL2, Please prepare prescriptions     Additional Comment:    _______________________________________________ Dulcy Fanny, LCSW 09/20/2015, 11:46 AM

## 2015-09-20 NOTE — Progress Notes (Signed)
Physical Therapy Treatment Patient Details Name: [name] MRN: [id number] DOB: February 15, 1966 Today's Date: 09/20/2015    History of Present Illness 50 yo female admitted with rectal bleeding, L foot and R elbow pain s/p fall. PMH: hypertension, hyperlipidemia, diabetes mellitus, GERD, depression, anxiety, anemia, GI bleeding, TIA, stroke, bipolar disorder, schizophrenia, HCV, ESRD-HD (dialysis noncompliance, last dialysis on 08/05/14), seizure, combined systolic and diastolic congestive heart failure    PT Comments    Patient limited by c/o pain this session. Continue to progress as tolerated with anticipated d/c to SNF for further skilled PT services.    Follow Up Recommendations  SNF;Supervision/Assistance - 24 hour     Equipment Recommendations  Other (comment) (tBA)    Recommendations for Other Services       Precautions / Restrictions Precautions Precautions: Fall Restrictions Weight Bearing Restrictions: No    Mobility  Bed Mobility Overal bed mobility: Modified Independent Bed Mobility: Supine to Sit;Sit to Supine     Supine to sit: Modified independent (Device/Increase time) Sit to supine: Modified independent (Device/Increase time)   General bed mobility comments: increased time needed; no physical assist  Transfers Overall transfer level: Needs assistance Equipment used: 1 person hand held assist Transfers: Sit to/from Stand Sit to Stand: Min assist         General transfer comment: assist for balance upon standing; vc for hand placement and technique; pt demonstrated impulsive behavior   Ambulation/Gait Ambulation/Gait assistance: Min assist Ambulation Distance (Feet): 20 Feet Assistive device: 1 person hand held assist;None Gait Pattern/deviations: Step-through pattern;Decreased stride length;Staggering left;Staggering right     General Gait Details: assist to maintain balance and vc for sequencing and for increased safety awareness; pt refused  further ambulation due to c/o pain    Stairs            Wheelchair Mobility    Modified Rankin (Stroke Patients Only)       Balance   Sitting-balance support: No upper extremity supported;Feet supported Sitting balance-Leahy Scale: Fair     Standing balance support: Single extremity supported Standing balance-Leahy Scale: Poor                      Cognition Arousal/Alertness: Awake/alert Behavior During Therapy: Agitated;Impulsive;Flat affect Overall Cognitive Status: No family/caregiver present to determine baseline cognitive functioning                      Exercises General Exercises - Lower Extremity Ankle Circles/Pumps: AROM;Both;10 reps;Supine Long Arc Quad: AROM;Both;10 reps;Supine Heel Slides: AROM;Both;10 reps;Supine    General Comments General comments (skin integrity, edema, etc.): pt with vomitting at end of session; RN aware; pt with impulsive behavior at times and decreased safety awareness      Pertinent Vitals/Pain Pain Assessment: Faces Faces Pain Scale: Hurts little more Pain Location: stomach Pain Descriptors / Indicators: Aching Pain Intervention(s): Monitored during session;Patient requesting pain meds-RN notified    Home Living                      Prior Function            PT Goals (current goals can now be found in the care plan section) Acute Rehab PT Goals Patient Stated Goal: to go to an ALF because i keep falling PT Goal Formulation: With patient Time For Goal Achievement: 10/02/15 Potential to Achieve Goals: Good Progress towards PT goals: Progressing toward goals    Frequency  Min 3X/week  PT Plan Current plan remains appropriate    Co-evaluation PT/OT/SLP Co-Evaluation/Treatment: Yes           End of Session Equipment Utilized During Treatment: Gait belt Activity Tolerance: Patient limited by pain Patient left: in bed;with call Hochstetler/phone within reach;with bed alarm set      Time: [id number] PT Time Calculation (min) (ACUTE ONLY): 18 min  Charges:  $Gait Training: 8-22 mins                    G Codes:      Salina April, PTA Pager: [phone]   09/20/2015, 3:47 PM

## 2015-09-20 NOTE — Discharge Summary (Signed)
Physician Discharge Summary  [name] [id number] DOB: 04/29/1966 DOA: 09/17/2015  PCP: [name], MD  Admit date: 09/17/2015 Discharge date: 09/20/2015  Time spent: greater than 30 minutes  Recommendations for Outpatient Follow-up:  1. To SNF 2. Continue dialysis qTTS 3. If patient decides to stop dialysis, would refer to hospice 4. Monitor blood glucose qac and hs   Discharge Diagnoses:  Principal Problem:   hematochezia Active Problems: Anemia secondary to acute blood loss and CKD   Seizure disorder (HCC)   Bipolar disorder (HCC)   Schizoaffective disorder, unspecified type (HCC)   Chronic combined systolic and diastolic CHF (congestive heart failure) ([place]), compensated   Diabetes mellitus with nephropathy (HCC)   ESRD (end stage renal disease) on dialysis (HCC)   Chronic pain syndrome   Long term current use of opiate analgesic   Cocaine abuse   History of stroke   Hyperkalemia   Palliative care encounter   Advanced directives, counseling/discussion   Discharge Condition: Stable  Diet recommendation: Renal/heart healthy  Filed Weights   09/17/15 1849 09/19/15 0700 09/19/15 1111  Weight: 69 kg (152 lb 1.9 oz) 71.1 kg (156 lb 12 oz) 68.6 kg (151 lb 3.8 oz)    History of present illness:  50 year old female patient with history of bipolar disorder/schizoaffective, Jehovah's Witness (has consented to blood transfusion this admission), HLD, DM 2/IDDM, hepatitis C, chronic pain syndrome, ESRD on TTS HD, noncompliant with HD (missed multiple-9 of last 12), seizure disorder, chronic combined CHF, anemia, HTN, TIA/stroke, substance abuse/cocaine, presented to [facility] ED on 09/17/15 with rectal bleeding. Hemoglobin 5.5, potassium 6.6, BUN 85, creatinine 13.19. Admitted to step down for management of lower GI bleeding, ESRD and hyperkalemia in the context of missed HD. Nephrology and [facility] GI consulted  Hospital Course:  1. Acute on chronic lower GI bleed: Hemodynamically  stable. EGD 05/11/15 by Dr. Laurence Spates was negative. Colonoscopy 06/22/13 by Dr. Watt Climes without signs of GI bleeding. [facility] consulted.  GI has seen again today, no BM's or bleeding since admission, advancing diet and have signed off.  2. Acute blood loss anemia complicating anemia of chronic kidney disease: Acute anemia secondary to GI bleeding. Patient is a Sales promotion account executive Witness but consented to transfusion during this admission. She received 3 units PRBCs this hospitalization and hemoglobin has improved to 9.6 g per DL. Continue Aranesp and a iron 3. ESRD on TTS HD/hyperkalemia: Missed several outpatient dialysis/ severely noncompliant. Had HD on 2/12 and will be repeated on 2/14. No heparin. Hyperkalemia resolved. Palliative care consulted. Patient willing to continue with dialysis for now, but may decide in the future to stop. If that's the case, would refer to hospice at that time. 4. Seizure disorder: Continue Keppra 5. Bipolar disorder/schizoaffective disorder: Not on medications at home. No suicidal or homicidal ideations. This may be contribute into her medical noncompliance. 6. DM 2/IDDM with hypoglycemia: was on Lantus 25 units nightly which resulted in hypoglycemia. Have decreased to 5 units nightly and sensitive sliding scale NovoLog. Would continue at skilled nursing facility  7. Chronic combined CHF: Missed several outpatient dialysis. 2-D echo 08/26/14: LVEF 40%. Does not appear extremely volume overloaded.  8. Polysubstance abuse-tobacco & cocaine: UDS positive for cocaine. Cessation counseled.  59. Fall with left foot, right elbow pain: X-ray of right elbow showed chronic supracondylar fracture of the right elbow with widened fracture gap and nonunion similar to prior exam. X-ray of left foot showed stable appearing transmetatarsal amputation. may continue oxycodone as needed.  10.  Essential hypertension:  uncontrolled. Have increased amlodipine to tell milligrams  11. Hyperlipidemia:  Statins. 12. Hepatitis C: Outpatient follow-up.    deconditioning: Patient worked with physical therapy and occupational therapy who recommended skilled nursing facility. Patient agrees and is stable for transfer once arrangements made.   Procedures:  Hemodialysis   Consultations:  Nephrology, [facility]   Discharge Exam: Filed Vitals:   09/19/15 1900 09/20/15 0300  BP: 118/77 184/94  Pulse: 97 99  Temp: 98.4 F (36.9 C) 99.4 F (37.4 C)  Resp: 16 16    General: asleep. Arousable. Comfortable. Cooperative.  Cardiovascular: regular rate rhythm without murmurs gallops rubs  Respiratory: clear to auscultation bilaterally without wheezes rhonchi or rales Abdomen soft nontender nondistended Extremities trace edema   psychiatric: Calm and cooperative with normal affect.  Discharge Instructions   Discharge Instructions    Walk with assistance    Complete by:  As directed           Current Discharge Medication List    START taking these medications   Details  acetaminophen (TYLENOL) 325 MG tablet Take 2 tablets (650 mg total) by mouth every 6 (six) hours as needed for mild pain (or Fever >/= 101).      CONTINUE these medications which have CHANGED   Details  amLODipine (NORVASC) 10 MG tablet Take 1 tablet (10 mg total) by mouth at bedtime.    insulin aspart (NOVOLOG) 100 UNIT/ML injection Inject 0-9 Units into the skin 3 (three) times daily with meals. Per sliding scale Qty: 10 mL, Refills: 11    insulin glargine (LANTUS) 100 UNIT/ML injection Inject 0.05 mLs (5 Units total) into the skin at bedtime. Qty: 10 mL, Refills: 11   Associated Diagnoses: 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 (HCC)    Oxycodone HCl 10 MG TABS Take 1 tablet (10 mg total) by mouth every 6 (six) hours as needed. Qty: 15 tablet, Refills: 0   Associated Diagnoses: Chronic pain      CONTINUE these medications which have NOT CHANGED   Details  atorvastatin (LIPITOR) 40 MG tablet Take 1  tablet (40 mg total) by mouth at bedtime. Qty: 30 tablet, Refills: 2    Cholecalciferol (VITAMIN D3) 1000 UNITS CAPS Take 1 capsule by mouth daily.    Coenzyme Q10 (COQ10) 100 MG CAPS Take 1 capsule by mouth daily.    Cyanocobalamin (B-12) 2500 MCG TABS Take 1 capsule by mouth daily.    diphenhydrAMINE (BENADRYL) 25 mg capsule Take 1 capsule (25 mg total) by mouth every 6 (six) hours as needed for itching. Qty: 30 capsule, Refills: 0    ferrous sulfate 325 (65 FE) MG tablet Take 1 tablet (325 mg total) by mouth daily with breakfast. Qty: 30 tablet, Refills: 0    folic acid (FOLVITE) 474 MCG tablet Take 800 mcg by mouth daily.    levETIRAcetam (KEPPRA) 500 MG tablet Take 500 mg by mouth every 12 (twelve) hours. Refills: 0    Multiple Vitamin (MULTIVITAMIN WITH MINERALS) TABS tablet Take 1 tablet by mouth daily.    mupirocin cream (BACTROBAN) 2 % Apply topically daily. Qty: 15 g, Refills: 0    niacin 500 MG tablet Take 500 mg by mouth at bedtime.    polyethylene glycol (MIRALAX / GLYCOLAX) packet Take 17 g by mouth 2 (two) times daily. Mix in 8 oz liquid and drink Refills: 11    promethazine (PHENERGAN) 12.5 MG tablet Take 1 tablet (12.5 mg total) by mouth every 6 (six)  hours as needed for nausea. Qty: 20 tablet, Refills: 0    vitamin A 8000 UNIT capsule Take 8,000 Units by mouth daily.      STOP taking these medications     Blood Glucose Monitoring Suppl (ACCU-CHEK AVIVA PLUS) W/DEVICE KIT      Lancet Devices (ACCU-CHEK SOFTCLIX) lancets        Allergies  Allergen Reactions  . Gabapentin Itching and Swelling  . Gabapentin Swelling  . Lyrica [Pregabalin] Swelling    Facial and leg swelling  . Other     NO BLOOD PRODUCTS. PT IS A JEHOVAH WITNESS  . Penicillins Hives    Has patient had a PCN reaction causing immediate rash, facial/tongue/throat swelling, SOB or lightheadedness with hypotension: Yes Has patient had a PCN reaction causing severe rash involving mucus  membranes or skin necrosis: No Has patient had a PCN reaction that required hospitalization No Has patient had a PCN reaction occurring within the last 10 years: No If all of the above answers are "NO", then may proceed with Cephalosporin use. Pt tolerates cefepime and ceftriaxone  . Saphris [Asenapine] Swelling    Swelling of the face  . Saphris [Asenapine] Swelling    Facial swelling  . Tramadol Swelling    Causes legs to swell  . Tramadol Swelling    Leg swelling   Follow-up Information    Follow up with WEBB, CAROL D, MD. Schedule an appointment as soon as possible for a visit in 3 days.   Specialty:  Family Medicine   Contact information:   [address] [place] [address] [phone]       Follow up with Johnny Bridge, MD. Schedule an appointment as soon as possible for a visit in 3 days.   Specialty:  Orthopedic Surgery   Why:  R elbow injury   Contact information:   [address] [place] [address] [phone]        The results of significant diagnostics from this hospitalization (including imaging, microbiology, ancillary and laboratory) are listed below for reference.    Significant Diagnostic Studies: Dg Elbow Complete Right  09/17/2015  CLINICAL DATA:  50 year old female with fall EXAM: RIGHT ELBOW - COMPLETE 3+ VIEW COMPARISON:  Radiograph dated 07/22/2014 FINDINGS: There is an old supracondylar fracture of right humerus with with widening of the fracture line and nonunion. There is fragmentation and non bridging callus along the fracture. There is approximately 1.5 cm lateral displacement of the distal fracture fragment similar to prior study. No new fracture identified. There is soft tissue swelling of the elbow with a small joint effusion. 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 Stable 1.5 cm lateral displacement of the distal  fracture fragment. Electronically Signed   By: Anner Crete M.D.   On: 09/17/2015 03:38   Dg Chest Port 1 View  09/17/2015  CLINICAL DATA:  History of end-stage renal disease, missed multiple dialysis appointments. History of diabetes, hypertension. EXAM: PORTABLE CHEST 1 VIEW COMPARISON:  Chest radiograph November 22, 2014 FINDINGS: Cardiac silhouette is mildly enlarged, mediastinal silhouette is unremarkable. Pulmonary vasculature appears normal. No pleural effusion or focal consolidation. No pneumothorax. Soft tissue planes and included osseous structures are nonsuspicious. IMPRESSION: Mild cardiomegaly, no acute pulmonary process. Electronically Signed   By: Elon Alas M.D.   On: 09/17/2015 02:41   Dg Abd Portable 1v  09/17/2015  CLINICAL DATA:  Abdominal pain and  rectal bleeding. EXAM: PORTABLE ABDOMEN - 1 VIEW COMPARISON:  CT abdomen and pelvis 05/12/2015 FINDINGS: Scattered gas and stool throughout the colon. No small or large bowel distention. Calcifications in the pelvis are likely vascular. No radiopaque stones. Visualized bones appear intact. IMPRESSION: Normal nonobstructive bowel gas pattern. Electronically Signed   By: Lucienne Capers M.D.   On: 09/17/2015 21:15   Dg Foot Complete Left  09/17/2015  CLINICAL DATA:  50 year old female with fall. EXAM: LEFT FOOT - COMPLETE 3+ VIEW COMPARISON:  Radiograph dated 16 16 FINDINGS: There is stable appearing transmetatarsal amputation of the left foot. A small bone fragment noted adjacent to the base of the first metatarsal similar to prior study. There is no acute fracture. No dislocation. There is diffuse soft tissue thickening at the amputation site with mild diffuse subcutaneous edema. No soft tissue gas identified. 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 No acute osseous pathology. Electronically Signed   By: Anner Crete M.D.   On: 09/17/2015 03:41    Microbiology: Recent Results (from the past  240 hour(s))  MRSA PCR Screening     Status: None   Collection Time: 09/17/15  6:00 PM  Result Value Ref Range Status   MRSA by PCR NEGATIVE NEGATIVE Final    Comment:        The GeneXpert MRSA Assay (FDA approved for NASAL specimens only), is one component of a comprehensive MRSA colonization surveillance program. It is not intended to diagnose MRSA infection nor to guide or monitor treatment for MRSA infections.      Labs: Basic Metabolic Panel:  Recent Labs Lab 09/17/15 0107 09/17/15 0439 09/17/15 1444 09/17/15 1726 09/18/15 0430 09/19/15 0730  NA 140 140 137 142 135 136  K 6.6* 5.9* 5.6* 3.2* 4.3 4.9  CL 96* 98* 100* 102 99* 99*  CO2 23 24  --   --  24 22  GLUCOSE 80 119* 103* 98 308* 233*  BUN 85* 89* 82* 24* 41* 46*  CREATININE 13.19* 13.07* 13.30* 3.90* 8.46* 9.44*  CALCIUM 8.2* 8.1*  --   --  7.8* 8.0*  PHOS  --   --   --   --   --  7.2*   Liver Function Tests:  Recent Labs Lab 09/17/15 0107 09/19/15 0730  AST 32  --   ALT 29  --   ALKPHOS 59  --   BILITOT 0.9  --   PROT 6.2*  --   ALBUMIN 2.9* 2.2*   No results for input(s): LIPASE, AMYLASE in the last 168 hours. No results for input(s): AMMONIA in the last 168 hours. CBC:  Recent Labs Lab 09/17/15 1806 09/17/15 2237 09/18/15 0430 09/18/15 1031 09/19/15 0627  WBC 3.3* 4.2 3.3* 3.6* 3.8*  HGB 8.9* 10.2* 8.7* 9.0* 9.6*  HCT 27.5* 31.6* 27.1* 28.8* 29.8*  MCV 90.2 91.3 91.6 92.9 94.3  PLT 182 238 168 195 177   Cardiac Enzymes:  Recent Labs Lab 09/17/15 0439 09/17/15 1320 09/17/15 1847  TROPONINI 0.06* 0.05* 0.05*   BNP: BNP (last 3 results)  Recent Labs  09/30/14 1324 10/27/14 2146 11/09/14 0251  BNP 61.6 63.7 242.0*    ProBNP (last 3 results) No results for input(s): PROBNP in the last 8760 hours.  CBG:  Recent Labs Lab 09/19/15 1232 09/19/15 1614 09/19/15 2252 09/20/15 0556 09/20/15 0642  GLUCAP 187* 189* 213* 175* 173*       Signed:  Delfina Redwood MD [facility] 09/20/2015, 9:07 AM

## 2015-09-20 NOTE — Progress Notes (Signed)
OT Cancellation Note  Patient Details Name: [name] MRN: [id number] DOB: 04/27/1966   Cancelled Treatment:    Reason Eval/Treat Not Completed: Pain limiting ability to participate.  Benito Mccreedy OTR/L [id number] 09/20/2015, 9:56 AM

## 2015-09-20 NOTE — NC FL2 (Signed)
[place] LEVEL OF CARE SCREENING TOOL     IDENTIFICATION  Patient Name: [name] Birthdate: 1966-06-09 Sex: female Admission Date (Current Location): 09/17/2015  [facility] and [place] Number:  Herbalist and Address:  [facility] [place], [place] [address] [place], [place] [address]      Provider Number: [id number]  Attending Physician Name and Address:  Delfina Redwood, MD  Relative Name and Phone Number:       Current Level of Care: Hospital Recommended Level of Care: [place] Prior Approval Number:    Date Approved/Denied:   PASRR Number: [id number]  Discharge Plan: SNF    Current Diagnoses: Patient Active Problem List   Diagnosis Date Noted  . Depression   . Palliative care encounter   . Advanced directives, counseling/discussion   . GI bleeding 09/17/2015  . Hyperkalemia 09/17/2015  . GIB (gastrointestinal bleeding) 09/17/2015  . Fall 09/17/2015  . Absolute anemia   . Schizoaffective disorder ([place])   . Abnormal drug screen (06/19/2015) (Cocaine matabolites detected) 07/04/2015  . Chronic right elbow pain 06/20/2015  . Chronic left foot pain 06/20/2015  . History of TIA (transient ischemic attack) 06/20/2015  . History of stroke 06/20/2015  . History of vitamin D deficiency 06/20/2015  . Substance use disorder Risk: "Very High" 06/20/2015  . Long term prescription opiate use 06/20/2015  . Opiate use 06/20/2015  . Opiate dependence, continuous ([place]) 06/20/2015  . Chronic pain 06/19/2015  . Chronic pain syndrome 06/19/2015  . Long term current use of opiate analgesic 06/19/2015  . Cocaine abuse 06/19/2015  . Left forearm pain   . ESRD (end stage renal disease) on dialysis ([place])   . Pancreatitis 05/10/2015  . Left arm pain 05/10/2015  . Diabetes mellitus with stage 5 chronic kidney disease ([place]) 05/10/2015  . Chronic renal failure, stage 5 (HCC)   . Dialysis patient ([place])   . Acute renal  failure ([place]) 05/01/2015  . Metabolic acidosis [id number]  . Diabetes mellitus with nephropathy ([place]) 05/01/2015  . Hypokalemia 05/01/2015  . RUQ abdominal pain 05/01/2015  . Hypertensive urgency 05/01/2015  . Acute hyponatremia 05/01/2015  . Leukopenia 05/01/2015  . Anemia of chronic kidney failure 05/01/2015  . Transcondylar fracture of distal humerus 12/26/2014  . Impaired renal function 12/26/2014  . Closed fracture of part of upper end of humerus 12/14/2014  . Nontraumatic amputation of left foot ([place]) 12/13/2014  . Unilateral visual loss 11/22/2014  . Symptomatic anemia 11/22/2014  . 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 ([place]) 11/22/2014  . Benign essential HTN 09/20/2014  . Chronic combined systolic and diastolic CHF (congestive heart failure) ([place])   . Anemia, iron deficiency 04/13/2014  . Schizoaffective disorder, unspecified type ([place]) 02/08/2014  . Bipolar disorder ([place]) 02/06/2014  . Noncompliance 02/02/2014  . Hep C w/o coma, chronic ([place]) 05/18/2013  . CKD (chronic kidney disease) stage 5, GFR less than 15 ml/min (HCC) 05/18/2013  . Seizure disorder ([place]) 02/09/2013    Orientation RESPIRATION BLADDER Height & Weight     Self, Time, Situation, Place  Normal Continent Weight: 151 lb 3.8 oz (68.6 kg) Height:  5\' 4"  (D34-534 cm)  BEHAVIORAL SYMPTOMS/MOOD NEUROLOGICAL BOWEL NUTRITION STATUS      Continent  (please check dc summary for dietary needs)  AMBULATORY STATUS COMMUNICATION OF NEEDS Skin   Extensive Assist Verbally Normal  Personal Care Assistance Level of Assistance  Dressing, Bathing Bathing Assistance: Limited assistance Feeding assistance: Independent Dressing Assistance: Limited assistance     Functional Limitations Info             SPECIAL CARE FACTORS FREQUENCY  PT (By licensed PT), OT (By licensed OT)     PT Frequency: daily OT Frequency: daily            Contractures Contractures Info: Not present     Additional Factors Info  Allergies   Allergies Info: gabapentin, lyrica, other, penicillins, saphris, tramadol           Current Medications (09/20/2015):  This is the current hospital active medication list Current Facility-Administered Medications  Medication Dose Route Frequency Provider Last Rate Last Dose  . 0.9 %  sodium chloride infusion  100 mL Intravenous PRN Roney Jaffe, MD      . 0.9 %  sodium chloride infusion  100 mL Intravenous PRN Roney Jaffe, MD      . acetaminophen (TYLENOL) tablet 650 mg  650 mg Oral Q6H PRN Ivor Costa, MD   650 mg at 09/17/15 1221   Or  . acetaminophen (TYLENOL) suppository 650 mg  650 mg Rectal Q6H PRN Ivor Costa, MD      . alteplase (CATHFLO ACTIVASE) injection 2 mg  2 mg Intracatheter Once PRN Roney Jaffe, MD      . amLODipine (NORVASC) tablet 5 mg  5 mg Oral QHS Hewitt Shorts Harduk, PA-C   5 mg at 09/19/15 2305  . atorvastatin (LIPITOR) tablet 40 mg  40 mg Oral QHS Ivor Costa, MD   40 mg at 09/19/15 2305  . cholecalciferol (VITAMIN D) tablet 1,000 Units  1,000 Units Oral Daily Ivor Costa, MD   1,000 Units at 09/19/15 1227  . Darbepoetin Alfa (ARANESP) injection 200 mcg  200 mcg Intravenous Q Tue-HD Jamal Maes, MD   200 mcg at 09/19/15 1034  . dextrose 5 %-0.9 % sodium chloride infusion   Intravenous Continuous Hewitt Shorts Harduk, PA-C 10 mL/hr at 09/17/15 2245    . diphenhydrAMINE (BENADRYL) 12.5 MG/5ML elixir 12.5 mg  12.5 mg Oral Q8H PRN Modena Jansky, MD   12.5 mg at 09/19/15 1445  . folic acid (FOLVITE) tablet 1 mg  1 mg Oral Daily Ivor Costa, MD   1 mg at 09/19/15 1227  . hydrALAZINE (APRESOLINE) injection 5 mg  5 mg Intravenous Q6H PRN Modena Jansky, MD   5 mg at 09/20/15 0408  . insulin aspart (novoLOG) injection 0-9 Units  0-9 Units Subcutaneous TID WC Ivor Costa, MD   2 Units at 09/20/15 [phone]  . levETIRAcetam (KEPPRA) tablet 500 mg  500 mg Oral BID Modena Jansky, MD   500 mg at 09/20/15 0847  . lidocaine (PF) (XYLOCAINE) 1 %  injection 5 mL  5 mL Intradermal PRN Roney Jaffe, MD      . lidocaine-prilocaine (EMLA) cream 1 application  1 application Topical PRN Roney Jaffe, MD      . morphine 2 MG/ML injection 1 mg  1 mg Intravenous Q6H PRN Modena Jansky, MD   1 mg at 09/19/15 2305  . multivitamin with minerals tablet 1 tablet  1 tablet Oral Daily Ivor Costa, MD   1 tablet at 09/19/15 1226  . mupirocin cream (BACTROBAN) 2 % 1 application  1 application Topical Daily PRN Ivor Costa, MD      . niacin tablet 500 mg  500 mg Oral QHS Ivor Costa,  MD   500 mg at 09/19/15 2305  . nicotine (NICODERM CQ - dosed in mg/24 hours) patch 21 mg  21 mg Transdermal Daily Ivor Costa, MD   21 mg at 09/19/15 1228  . oxyCODONE (Oxy IR/ROXICODONE) immediate release tablet 10 mg  10 mg Oral Q6H PRN Modena Jansky, MD   10 mg at 09/19/15 1226  . pantoprazole (PROTONIX) EC tablet 40 mg  40 mg Oral Q0600 Ronald Lobo, MD   40 mg at 09/20/15 0546  . pentafluoroprop-tetrafluoroeth (GEBAUERS) aerosol 1 application  1 application Topical PRN Roney Jaffe, MD      . sodium chloride flush (NS) 0.9 % injection 3 mL  3 mL Intravenous Q12H Ivor Costa, MD   3 mL at 09/19/15 2200  . sorbitol 70 % solution 30 mL  30 mL Oral Once Delfina Redwood, MD         Discharge Medications: Please see discharge summary for a list of discharge medications.  Relevant Imaging Results:  Relevant Lab Results:   Additional Information SSN: [id number]  Dulcy Fanny, LCSW

## 2015-09-20 NOTE — Progress Notes (Signed)
[facility] Rounding Note  Subjective:   Notes reviewed She reiterated to Palliative Care much of what she told me yesterday AM Today she is complaining of some rectal discomfort and needs to have a BM Appears SNF search is underway  Objective Vital signs in last 24 hours: Filed Vitals:   09/19/15 1111 09/19/15 1300 09/19/15 1900 09/20/15 0300  BP: 169/89 159/75 118/77 184/94  Pulse: 97 105 97 99  Temp: 98.3 F (36.8 C) 98.5 F (36.9 C) 98.4 F (36.9 C) 99.4 F (37.4 C)  TempSrc: Oral  Oral Oral  Resp: 14 160 16 16  Height:      Weight: 68.6 kg (151 lb 3.8 oz)     SpO2: 100% 100% 100% 100%   Weight change: -2.5 kg (-5 lb 8.2 oz)  Intake/Output Summary (Last 24 hours) at 09/20/15 1104 Last data filed at 09/20/15 0900  Gross per 24 hour  Intake    483 ml  Output   1898 ml  Net  -1415 ml   Physical Exam:  BP 184/94 mmHg  Pulse 99  Temp(Src) 99.4 F (37.4 C) (Oral)  Resp 16  Ht 5\' 4"  (1.626 m)  Wt 68.6 kg (151 lb 3.8 oz)  BMI 25.95 kg/m2  SpO2 100%  LMP  (LMP Unknown)  Young AAF NAD Wt 68.6 post HD yesterday (will be new EDW) Sclera anicteric No jvd Chest clear  Regular S1S2 No S3 Abd soft ntnd no mass or tenderness MS L TMA well healed but with some scabbing over top from "bumping it" Ext No sig edema Nonfocal, Ox 3   Labs: Basic Metabolic Panel:  Recent Labs Lab 09/17/15 0107 09/17/15 0439 09/17/15 1444 09/17/15 1726 09/18/15 0430 09/19/15 0730  NA 140 140 137 142 135 136  K 6.6* 5.9* 5.6* 3.2* 4.3 4.9  CL 96* 98* 100* 102 99* 99*  CO2 23 24  --   --  24 22  GLUCOSE 80 119* 103* 98 308* 233*  BUN 85* 89* 82* 24* 41* 46*  CREATININE 13.19* 13.07* 13.30* 3.90* 8.46* 9.44*  CALCIUM 8.2* 8.1*  --   --  7.8* 8.0*  PHOS  --   --   --   --   --  7.2*   Liver Function Tests:  Recent Labs Lab 09/17/15 0107 09/19/15 0730  AST 32  --   ALT 29  --   ALKPHOS 59  --   BILITOT 0.9  --   PROT 6.2*  --   ALBUMIN 2.9* 2.2*   CBC:  Recent Labs Lab  09/17/15 2237 09/18/15 0430 09/18/15 1031 09/19/15 0627  WBC 4.2 3.3* 3.6* 3.8*  HGB 10.2* 8.7* 9.0* 9.6*  HCT 31.6* 27.1* 28.8* 29.8*  MCV 91.3 91.6 92.9 94.3  PLT 238 168 195 177   :  Recent Labs Lab 09/17/15 0439 09/17/15 1320 09/17/15 1847  TROPONINI 0.06* 0.05* 0.05*   CBG:  Recent Labs Lab 09/19/15 1232 09/19/15 1614 09/19/15 2252 09/20/15 0556 09/20/15 0642  GLUCAP 187* 189* 213* 175* 173*    Iron Studies:  Results for DIONA, RUISI (MRN [id number]) as of 09/20/2015 11:25  Ref. Range 09/19/2015 07:30  Iron Latest Ref Range: 28-170 ug/dL 26 (L)  UIBC Latest Units: ug/dL 255  TIBC Latest Ref Range: 250-450 ug/dL 281  Saturation Ratios Latest Ref Range: 10.4-31.8 % 9 (L)    Studies/Results: No results found. Medications: . dextrose 5 % and 0.9% NaCl 10 mL/hr at 09/17/15 2245   . amLODipine  5 mg Oral QHS  . atorvastatin  40 mg Oral QHS  . cholecalciferol  1,000 Units Oral Daily  . darbepoetin (ARANESP) injection - DIALYSIS  200 mcg Intravenous Q Tue-HD  . folic acid  1 mg Oral Daily  . insulin aspart  0-9 Units Subcutaneous TID WC  . levETIRAcetam  500 mg Oral BID  . multivitamin with minerals  1 tablet Oral Daily  . niacin  500 mg Oral QHS  . nicotine  21 mg Transdermal Daily  . pantoprazole  40 mg Oral Q0600  . sodium chloride flush  3 mL Intravenous Q12H   Dialysis: [facility] 3h 33min  72.5kg will have lower EDW at discharge - probably around 68.5 kg 2/2.5 bath Hep 5000 LFA AVG 8.2/ 16%/ 295 > mircera 225 last 1/25, venofer 100 mg has had 8 of 9 doses last 2/1 9.3/ 5.2/ 263 > no binder or vit D Norvasc 5mg / coreg 25 mg > BP high, missed 9 of last 12 outpt sessions  Assessment: 1. Rectal bleeding - transfused 2/12 for 3 units for Hb 4.8). GI initially said probable colonoscopy this admission, but they signed off.  Pt says still seeing some blood and having rectal pain (also says needs to have BM) . Will give dulcolax suppository.  2. Anemia  of CKD/ ABL - blood loss plus misses mircera and iron d/t no attendance at HD. 3 units PRBC's for nadir Hb of 4.8. Last Hb 9.6. Aranesp 200 per week while here. Gave dose with HD 2/13 . Rec'd Fe last HD - needs load for tsat of 9%. Give 7 more doses ferrelicit  3. ESRD [facility] [place] with severe noncompliance w HD (missed 9 of last 12 outpt sessions). Next HD on Thursday. No heparin. 2K bath pending labs.  On schedule.  New lower EDW (looking like it will be 68.5 kg) 4. DM on insulin - per primary 5. Hep C+ 6. Bipolar disorder/schizoaffective disorder - on no meds 7. Chronic pain disorder  8. Jehovah witness (but consented to transfusion this admission) 9. Chronic combined CHF. ECHO 08/26/14 LVEF 40% 10. Sz disorder 11. R supracondylar elbow fracture with non-union. Pain mgmt. ? If ortho has seen   12. HTN - meds/volume control 13. HLD - atorvastatin 14. Dialysis non-attendance "I don't like it". Says she hates it - hates that life - family members tell her she should "stay on HD" but then says has no family support at all - they don't even come around she says. We talked about Hospice/Palliative care option and she thanked me for talking frankly with her.  Palliative care consult done yesterday - will continue HD for now but if desires to stop should transition to [facility].     Jamal Maes, MD [facility] [phone] pager 09/20/2015, 11:04 AM

## 2015-09-21 ENCOUNTER — Non-Acute Institutional Stay (SKILLED_NURSING_FACILITY): Payer: Medicare Other | Admitting: Internal Medicine

## 2015-09-21 ENCOUNTER — Encounter: Payer: Self-pay | Admitting: Internal Medicine

## 2015-09-21 DIAGNOSIS — Z9119 Patient's noncompliance with other medical treatment and regimen: Secondary | ICD-10-CM | POA: Diagnosis not present

## 2015-09-21 DIAGNOSIS — F250 Schizoaffective disorder, bipolar type: Secondary | ICD-10-CM | POA: Diagnosis not present

## 2015-09-21 DIAGNOSIS — E1122 Type 2 diabetes mellitus with diabetic chronic kidney disease: Secondary | ICD-10-CM | POA: Diagnosis not present

## 2015-09-21 DIAGNOSIS — G40909 Epilepsy, unspecified, not intractable, without status epilepticus: Secondary | ICD-10-CM

## 2015-09-21 DIAGNOSIS — Z79891 Long term (current) use of opiate analgesic: Secondary | ICD-10-CM

## 2015-09-21 DIAGNOSIS — Z992 Dependence on renal dialysis: Secondary | ICD-10-CM

## 2015-09-21 DIAGNOSIS — R5381 Other malaise: Secondary | ICD-10-CM | POA: Diagnosis not present

## 2015-09-21 DIAGNOSIS — I5042 Chronic combined systolic (congestive) and diastolic (congestive) heart failure: Secondary | ICD-10-CM

## 2015-09-21 DIAGNOSIS — Z91199 Patient's noncompliance with other medical treatment and regimen due to unspecified reason: Secondary | ICD-10-CM

## 2015-09-21 DIAGNOSIS — N186 End stage renal disease: Secondary | ICD-10-CM

## 2015-09-21 DIAGNOSIS — N185 Chronic kidney disease, stage 5: Secondary | ICD-10-CM

## 2015-09-21 DIAGNOSIS — B182 Chronic viral hepatitis C: Secondary | ICD-10-CM | POA: Diagnosis not present

## 2015-09-21 DIAGNOSIS — D509 Iron deficiency anemia, unspecified: Secondary | ICD-10-CM | POA: Diagnosis not present

## 2015-09-21 DIAGNOSIS — Z8673 Personal history of transient ischemic attack (TIA), and cerebral infarction without residual deficits: Secondary | ICD-10-CM | POA: Diagnosis not present

## 2015-09-21 DIAGNOSIS — G894 Chronic pain syndrome: Secondary | ICD-10-CM

## 2015-09-21 NOTE — Progress Notes (Signed)
Patient ID: [name], female   DOB: 08-18-1965, 50 y.o.   MRN: [id number]    HISTORY AND PHYSICAL   DATE: 09/21/15  Location:  [facility]    Place of Service: SNF (31)   Extended Emergency Contact Information Primary Emergency Contact: Clent Demark, [place] [address] [place] Phone: [phone] Relation: Aunt Secondary Emergency Contact: Candis Schatz [place] Phone: [phone] Mobile Phone: [phone] Relation: Son  Advanced Directive information Does patient have an advance directive?: Yes, Type of Advance Directive: Living will  Chief Complaint  Patient presents with  . New Admit To SNF    HPI:  50 yo female seen today as a new admission into SNF following hospital stay for GI bleed, ESRD on HD TThSa, seizure d/o, bipolar d/o, schizoaffective d/o, DM, chronic combined HF, chronic pain syndrome, cocaine abuse, hyperkalemia, hx stroke. She is a Jehovah witness but consented to blood transfusion - total 3 units PRBCs given. She was given HD due to multiple missed appts at HD. EGD/colonoscopy in past  both neg for acute cause of bleed. GI saw pt and advanced diet. Anemia thought to be due to ESRD. Hgb 9.6 at d/c.  Today she reports feeling anxious at times and scratched herself. No sz's. She has left ankle pain since her fall about 1 week ago. xray in hospital revealed no acute process. She also has right elbow pain and xrays showed chronic elbow fx with 1.5cm lateral displacement of distal fx fragment.     DM - lantus reduced in hospital due to hypoglycemia. She receives 5 units qhs  sz d/o - stable on keppra. No sz's since admission into SNF  Bipolar d/o/schizoafective d/o - not on meds at home. No IS/HI  HF - last EF 40%. Stable  Polysubstance abuse hx - UDS (+) cocaine  HTN - BP stable on amloipine  Hyperlipidemia - stable on statin  Hx Hep C - followed o/p  Past Medical History    Diagnosis Date  . Neuropathy ([place])   . Glaucoma   . Bipolar 1 disorder ([place])   . Refusal of blood transfusions as patient is Jehovah's Witness   . TIA (transient ischemic attack) 2010  . MRSA infection ?2014    "on the back of my head; spread throughout my bloodstream"  . Bipolar disorder, unspecified ([place]) 02/06/2014  . High cholesterol   . 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 Hepatitis C   . Chronic pain syndrome   . ESRD on hemodialysis ([place])   . Schizoaffective disorder, unspecified condition 02/08/2014  . Seizure [facility]) dx'd 2014    "don't know what kind; last one was ~ 04/2014"  . Chronic combined systolic and diastolic CHF (congestive heart failure) (HCC)     EF 40% by echo and 48% by stress test  . Anemia   . Hypertension   . Depression   . Anxiety   . GERD (gastroesophageal reflux disease)   . History of hiatal hernia   . Headache     sinus headaches  . History of vitamin D deficiency 06/20/2015  . Stroke [facility])     TIA 2009    Past Surgical History  Procedure Laterality Date  . Amputation Left 05/21/2013    Procedure: Left Midfoot AMPUTATION;  Surgeon: Newt Minion, MD;  Location: [place];  Service: Orthopedics;  Laterality: Left;  Left  Midfoot Amputation  . Tubal ligation    . Colonoscopy with propofol N/A 06/22/2013    Procedure: COLONOSCOPY WITH PROPOFOL;  Surgeon: Jeryl Columbia, MD;  Location: [facility] ENDOSCOPY;  Service: Endoscopy;  Laterality: N/A;  . Abdominal hysterectomy  2014  . Tonsillectomy  1970's  . Refractive surgery Bilateral 2013  . Tubal ligation  2000  . Abdominal hysterectomy  2010  . Foot amputation Left     due diabetic neuropathy, could not feel ulcer on bottom of foot  . Eye surgery Bilateral 2010    Lasik  . Av fistula placement Left 05/04/2015    Procedure: ARTERIOVENOUS (AV) GRAFT INSERTION;  Surgeon: Angelia Mould, MD;  Location: [place];  Service: Vascular;  Laterality: Left;  . Esophagogastroduodenoscopy N/A  05/11/2015    Procedure: ESOPHAGOGASTRODUODENOSCOPY (EGD);  Surgeon: Laurence Spates, MD;  Location: [facility] ENDOSCOPY;  Service: Endoscopy;  Laterality: N/A;    Patient Care Team: Maurice Small, MD as PCP - General (Family Medicine) Greg Cutter, LCSW as [place] Management (Licensed Clinical Social Worker) Valente David, RN as [facility]  Social History   Social History  . Marital Status: Unknown    Spouse Name: N/A  . Number of Children: N/A  . Years of Education: N/A   Occupational History  . Not on file.   Social History Main Topics  . Smoking status: Current Some Day Smoker -- 1.00 packs/day for 20 years    Types: Cigarettes    Last Attempt to Quit: 03/31/2015  . Smokeless tobacco: Never Used     Comment: "stopped smoking in ~ 2013; restarted last week; stopped again 06/17/2014"  . Alcohol Use: No     Comment: 05/01/15 patient denies alcohol use.  . Drug Use: Yes    Special: "Crack" cocaine, Cocaine     Comment: last smoked crack 11-07-14  . Sexual Activity: Not Currently    Birth Control/ Protection: Abstinence   Other Topics Concern  . Not on file   Social History Narrative   ** Merged History Encounter **         reports that she has been smoking Cigarettes.  She has a 20 pack-year smoking history. She has never used smokeless tobacco. She reports that she uses illicit drugs ("Crack" cocaine and Cocaine). She reports that she does not drink alcohol.  Family History  Problem Relation Age of Onset  . Hypertension Mother   . Hypertension Father    Family Status  Relation Status Death Age  . Mother Alive   . Father Deceased     Immunization History  Administered Date(s) Administered  . Influenza,inj,Quad PF,36+ Mos 05/19/2013, 04/23/2014, 05/05/2015  . Pneumococcal Polysaccharide-23 02/09/2013, 06/03/2013  . Tdap 05/19/2013    Allergies  Allergen Reactions  . Gabapentin Itching and Swelling  . Gabapentin  Swelling  . Lyrica [Pregabalin] Swelling    Facial and leg swelling  . Other     NO BLOOD PRODUCTS. PT IS A JEHOVAH WITNESS  . Penicillins Hives    Has patient had a PCN reaction causing immediate rash, facial/tongue/throat swelling, SOB or lightheadedness with hypotension: Yes Has patient had a PCN reaction causing severe rash involving mucus membranes or skin necrosis: No Has patient had a PCN reaction that required hospitalization No Has patient had a PCN reaction occurring within the last 10 years: No If all of the above answers are "NO", then may proceed with Cephalosporin use. Pt tolerates cefepime and ceftriaxone  .  Saphris [Asenapine] Swelling    Swelling of the face  . Saphris [Asenapine] Swelling    Facial swelling  . Tramadol Swelling    Causes legs to swell  . Tramadol Swelling    Leg swelling    Medications: Patient's Medications  New Prescriptions   No medications on file  Previous Medications   ACETAMINOPHEN (TYLENOL) 325 MG TABLET    Take 2 tablets (650 mg total) by mouth every 6 (six) hours as needed for mild pain (or Fever >/= 101).   AMLODIPINE (NORVASC) 10 MG TABLET    Take 1 tablet (10 mg total) by mouth at bedtime.   ATORVASTATIN (LIPITOR) 40 MG TABLET    Take 1 tablet (40 mg total) by mouth at bedtime.   CHOLECALCIFEROL (VITAMIN D3) 1000 UNITS CAPS    Take 1 capsule by mouth daily.   COENZYME Q10 (COQ10) 100 MG CAPS    Take 1 capsule by mouth daily.   CYANOCOBALAMIN (B-12) 2500 MCG TABS    Take 1 capsule by mouth daily.   DIPHENHYDRAMINE (BENADRYL) 25 MG CAPSULE    Take 1 capsule (25 mg total) by mouth every 6 (six) hours as needed for itching.   FERROUS SULFATE 325 (65 FE) MG TABLET    Take 1 tablet (325 mg total) by mouth daily with breakfast.   FOLIC ACID (FOLVITE) 676 MCG TABLET    Take 800 mcg by mouth daily.   INSULIN ASPART (NOVOLOG) 100 UNIT/ML INJECTION    Inject 0-9 Units into the skin 3 (three) times daily with meals. Per sliding scale   INSULIN  GLARGINE (LANTUS) 100 UNIT/ML INJECTION    Inject 0.05 mLs (5 Units total) into the skin at bedtime.   LEVETIRACETAM (KEPPRA) 500 MG TABLET    Take 500 mg by mouth every 12 (twelve) hours.   MULTIPLE VITAMIN (MULTIVITAMIN WITH MINERALS) TABS TABLET    Take 1 tablet by mouth daily.   MUPIROCIN CREAM (BACTROBAN) 2 %    Apply topically daily.   NIACIN 500 MG TABLET    Take 500 mg by mouth at bedtime.   OXYCODONE HCL 10 MG TABS    Take 1 tablet (10 mg total) by mouth every 6 (six) hours as needed.   POLYETHYLENE GLYCOL (MIRALAX / GLYCOLAX) PACKET    Take 17 g by mouth 2 (two) times daily. Mix in 8 oz liquid and drink   PROMETHAZINE (PHENERGAN) 12.5 MG TABLET    Take 1 tablet (12.5 mg total) by mouth every 6 (six) hours as needed for nausea.   VITAMIN A 8000 UNIT CAPSULE    Take 8,000 Units by mouth daily.  Modified Medications   No medications on file  Discontinued Medications   No medications on file    Review of Systems  Unable to perform ROS: Psychiatric disorder    Filed Vitals:   09/21/15 1145  BP: 123/69  Pulse: 90  Temp: 97.6 F (36.4 C)  TempSrc: Oral  Resp: 18  SpO2: 98%   There is no weight on file to calculate BMI.  Physical Exam  Constitutional: She appears well-developed.  Lying in bed resting in NAD. Looks pale, frail appearing.  HENT:  Mouth/Throat: Oropharynx is clear and moist. No oropharyngeal exudate.  Eyes: Pupils are equal, round, and reactive to light. No scleral icterus.  Neck: Neck supple. Carotid bruit is not present. No tracheal deviation present. No thyromegaly present.  Cardiovascular: Regular rhythm, normal heart sounds and intact distal pulses.  Tachycardia present.  Exam reveals no gallop and no friction rub.   No murmur heard. Left TMA. No LE edema b/l. No calf TTP. Left forearm AVG with audible bruit, palpable thrill  Pulmonary/Chest: Effort normal and breath sounds normal. No stridor. No respiratory distress. She has no wheezes. She has no rales.   Abdominal: Soft. Bowel sounds are normal. She exhibits no distension and no mass. There is no hepatomegaly. There is no tenderness. There is no rebound and no guarding.  Musculoskeletal: She exhibits edema and tenderness.  Right elbow TTP with reduced ROM. Left TMA  Lymphadenopathy:    She has no cervical adenopathy.  Neurological: She is alert.  Skin: Skin is warm and dry. No rash noted.  Multiple excoriations on abdomen. No secondary signs of infection  Psychiatric: Her behavior is normal. Her mood appears anxious.     Labs reviewed: Admission on 09/17/2015, Discharged on 09/20/2015  No results displayed because visit has over 200 results.  CBC Latest Ref Rng 09/19/2015 09/18/2015 09/18/2015  WBC 4.0 - 10.5 K/uL 3.8(L) 3.6(L) 3.3(L)  Hemoglobin 12.0 - 15.0 g/dL 9.6(L) 9.0(L) 8.7(L)  Hematocrit 36.0 - 46.0 % 29.8(L) 28.8(L) 27.1(L)  Platelets 150 - 400 K/uL 177 195 168    CMP Latest Ref Rng 09/19/2015 09/18/2015 09/17/2015  Glucose 65 - 99 mg/dL 233(H) 308(H) 98  BUN 6 - 20 mg/dL 46(H) 41(H) 24(H)  Creatinine 0.44 - 1.00 mg/dL 9.44(H) 8.46(H) 3.90(H)  Sodium 135 - 145 mmol/L 136 135 142  Potassium 3.5 - 5.1 mmol/L 4.9 4.3 3.2(L)  Chloride 101 - 111 mmol/L 99(L) 99(L) 102  CO2 22 - 32 mmol/L 22 24 -  Calcium 8.9 - 10.3 mg/dL 8.0(L) 7.8(L) -  Total Protein 6.5 - 8.1 g/dL - - -  Total Bilirubin 0.3 - 1.2 mg/dL - - -  Alkaline Phos 38 - 126 U/L - - -  AST 15 - 41 U/L - - -  ALT 14 - 54 U/L - - -      Admission on 07/06/2015, Discharged on 07/06/2015  Component Date Value Ref Range Status  . Lipase 07/06/2015 42  11 - 51 U/L Final  . Sodium 07/06/2015 136  135 - 145 mmol/L Final  . Potassium 07/06/2015 3.4* 3.5 - 5.1 mmol/L Final  . Chloride 07/06/2015 95* 101 - 111 mmol/L Final  . CO2 07/06/2015 30  22 - 32 mmol/L Final  . Glucose, Bld 07/06/2015 152* 65 - 99 mg/dL Final  . BUN 07/06/2015 38* 6 - 20 mg/dL Final  . Creatinine, Ser 07/06/2015 6.39* 0.44 - 1.00 mg/dL Final  .  Calcium 07/06/2015 9.0  8.9 - 10.3 mg/dL Final  . Total Protein 07/06/2015 6.5  6.5 - 8.1 g/dL Final  . Albumin 07/06/2015 2.6* 3.5 - 5.0 g/dL Final  . AST 07/06/2015 44* 15 - 41 U/L Final  . ALT 07/06/2015 34  14 - 54 U/L Final  . Alkaline Phosphatase 07/06/2015 73  38 - 126 U/L Final  . Total Bilirubin 07/06/2015 0.9  0.3 - 1.2 mg/dL Final  . GFR calc non Af Amer 07/06/2015 7* >60 mL/min Final  . GFR calc Af Amer 07/06/2015 8* >60 mL/min Final   Comment: (NOTE) The eGFR has been calculated using the CKD EPI equation. This calculation has not been validated in all clinical situations. eGFR's persistently <60 mL/min signify possible Chronic Kidney Disease.   . Anion gap 07/06/2015 11  5 - 15 Final  . WBC 07/06/2015 3.4* 4.0 - 10.5 K/uL Final  .  RBC 07/06/2015 3.45* 3.87 - 5.11 MIL/uL Final  . Hemoglobin 07/06/2015 10.1* 12.0 - 15.0 g/dL Final  . HCT 07/06/2015 32.3* 36.0 - 46.0 % Final  . MCV 07/06/2015 93.6  78.0 - 100.0 fL Final  . MCH 07/06/2015 29.3  26.0 - 34.0 pg Final  . MCHC 07/06/2015 31.3  30.0 - 36.0 g/dL Final  . RDW 07/06/2015 16.1* 11.5 - 15.5 % Final  . Platelets 07/06/2015 171  150 - 400 K/uL Final  . Color, Urine 07/06/2015 YELLOW  YELLOW Final  . APPearance 07/06/2015 CLOUDY* CLEAR Final  . Specific Gravity, Urine 07/06/2015 1.016  1.005 - 1.030 Final  . pH 07/06/2015 7.5  5.0 - 8.0 Final  . Glucose, UA 07/06/2015 250* NEGATIVE mg/dL Final  . Hgb urine dipstick 07/06/2015 MODERATE* NEGATIVE Final  . Bilirubin Urine 07/06/2015 NEGATIVE  NEGATIVE Final  . Ketones, ur 07/06/2015 15* NEGATIVE mg/dL Final  . Protein, ur 07/06/2015 >300* NEGATIVE mg/dL Final  . Nitrite 07/06/2015 NEGATIVE  NEGATIVE Final  . Leukocytes, UA 07/06/2015 NEGATIVE  NEGATIVE Final  . Glucose-Capillary 07/06/2015 133* 65 - 99 mg/dL Final  . Squamous Epithelial / LPF 07/06/2015 6-30* NONE SEEN Final  . WBC, UA 07/06/2015 0-5  0 - 5 WBC/hpf Final  . RBC / HPF 07/06/2015 0-5  0 - 5 RBC/hpf  Final  . Bacteria, UA 07/06/2015 RARE* NONE SEEN Final    Dg Elbow Complete Right  09/17/2015  CLINICAL DATA:  50 year old female with fall EXAM: RIGHT ELBOW - COMPLETE 3+ VIEW COMPARISON:  Radiograph dated 07/22/2014 FINDINGS: There is an old supracondylar fracture of right humerus with with widening of the fracture line and nonunion. There is fragmentation and non bridging callus along the fracture. There is approximately 1.5 cm lateral displacement of the distal fracture fragment similar to prior study. No new fracture identified. There is soft tissue swelling of the elbow with a small joint effusion. 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 Stable 1.5 cm lateral displacement of the distal fracture fragment. Electronically Signed   By: Anner Crete M.D.   On: 09/17/2015 03:38   Dg Chest Port 1 View  09/17/2015  CLINICAL DATA:  History of end-stage renal disease, missed multiple dialysis appointments. History of diabetes, hypertension. EXAM: PORTABLE CHEST 1 VIEW COMPARISON:  Chest radiograph November 22, 2014 FINDINGS: Cardiac silhouette is mildly enlarged, mediastinal silhouette is unremarkable. Pulmonary vasculature appears normal. No pleural effusion or focal consolidation. No pneumothorax. Soft tissue planes and included osseous structures are nonsuspicious. IMPRESSION: Mild cardiomegaly, no acute pulmonary process. Electronically Signed   By: Elon Alas M.D.   On: 09/17/2015 02:41   Dg Abd Portable 1v  09/17/2015  CLINICAL DATA:  Abdominal pain and rectal bleeding. EXAM: PORTABLE ABDOMEN - 1 VIEW COMPARISON:  CT abdomen and pelvis 05/12/2015 FINDINGS: Scattered gas and stool throughout the colon. No small or large bowel distention. Calcifications in the pelvis are likely vascular. No radiopaque stones. Visualized bones appear intact. IMPRESSION: Normal nonobstructive bowel gas pattern. Electronically Signed    By: Lucienne Capers M.D.   On: 09/17/2015 21:15   Dg Foot Complete Left  09/17/2015  CLINICAL DATA:  50 year old female with fall. EXAM: LEFT FOOT - COMPLETE 3+ VIEW COMPARISON:  Radiograph dated 16 16 FINDINGS: There is stable appearing transmetatarsal amputation of the left foot. A small bone fragment noted adjacent to the base of the first metatarsal similar to prior study. There is no  acute fracture. No dislocation. There is diffuse soft tissue thickening at the amputation site with mild diffuse subcutaneous edema. No soft tissue gas identified. 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 No acute osseous pathology. Electronically Signed   By: Anner Crete M.D.   On: 09/17/2015 03:41     Assessment/Plan   ICD-9-CM ICD-10-CM   1. Physical deconditioning 799.3 R53.81   2. Anemia, iron deficiency 280.9 D50.9   3. ESRD (end stage renal disease) on dialysis ([place]) 585.6 N18.6    V45.11 Z99.2   4. Chronic pain syndrome 338.4 G89.4    especially left foot and right elbow  5. Diabetes mellitus with stage 5 chronic kidney disease (HCC) 250.40 E11.22    585.5 N18.5   6. Chronic combined systolic and diastolic CHF (congestive heart failure) (HCC) 428.42 I50.42    428.0    7. History of stroke V12.54 Z86.73   8. Hep C w/o coma, chronic (HCC) 070.54 B18.2   9. Long term current use of opiate analgesic; hx polysubstance abuse V58.69 Z79.891   10. Noncompliance V15.81 Z91.19   11. Schizoaffective disorder, bipolar type ([place]) 295.70 F25.0   12. Seizure disorder ([place]) 345.90 G40.909     Cont current meds as ordered  PT/OT/ST as ordered  Psych services to follow  F/u with HD as scheduled. Pt states she usually goes MWF!  F/u with specialists as scheduled  GOAL: short term rehab and d/c home when medically appropriate. Communicated with pt and nursing.  Will follow  Chantia Amalfitano S. Perlie Gold  [facility] [address] [place], [place] [address] [phone] Cell (Monday-Friday 8 AM - 5 PM) [phone] After 5 PM and follow prompts

## 2015-09-22 ENCOUNTER — Other Ambulatory Visit: Payer: Self-pay | Admitting: Licensed Clinical Social Worker

## 2015-09-22 NOTE — Patient Outreach (Signed)
[facility]) Care Management  09/22/2015  [name] August 05, 1966 [id number]   Assessment-CSW completed SNF visit on 09/22/15. CSW was notified that patient is currently receiving dialysis treatment. Patient receives dialysis Mondays, Wednesdays and Fridays (2nd shift.) Nurse informed CSW that a good time with be during the morning on those days. Patient will reschedule this SNF visit for 09/25/15 at 10:00 am. CSW still unable to reach patient by phone. Patient informed [facility] Liaison that her phone will not be working around until 09/24/15. CSW spoke to SNF discharge planer before leaving. She states that she has concerns for her discharge plan as patient reported to her she has no where to stay. Discharge planner states that patient does not wish return to her aunt's house but has no plans to find stable housing.CSW provided contact information to SNF discharge planner.  Plan-CSW will complete SNF visit on 09/25/15.  Eula Fried, BSW, [email or web address], [email or web address] Phone: [phone] Fax: [phone]

## 2015-09-25 ENCOUNTER — Other Ambulatory Visit: Payer: Self-pay | Admitting: Licensed Clinical Social Worker

## 2015-09-25 ENCOUNTER — Encounter: Payer: Self-pay | Admitting: Licensed Clinical Social Worker

## 2015-09-25 NOTE — Patient Outreach (Signed)
[facility]) Care Management  [facility] Social Work  09/25/2015  [name] Dec 04, 1965 [id number]   Current Medications:  Current Outpatient Prescriptions  Medication Sig Dispense Refill  . acetaminophen (TYLENOL) 325 MG tablet Take 2 tablets (650 mg total) by mouth every 6 (six) hours as needed for mild pain (or Fever >/= 101).    Marland Kitchen amLODipine (NORVASC) 10 MG tablet Take 1 tablet (10 mg total) by mouth at bedtime.    Marland Kitchen atorvastatin (LIPITOR) 40 MG tablet Take 1 tablet (40 mg total) by mouth at bedtime. 30 tablet 2  . Cholecalciferol (VITAMIN D3) 1000 UNITS CAPS Take 1 capsule by mouth daily.    . Coenzyme Q10 (COQ10) 100 MG CAPS Take 1 capsule by mouth daily.    . Cyanocobalamin (B-12) 2500 MCG TABS Take 1 capsule by mouth daily.    . diphenhydrAMINE (BENADRYL) 25 mg capsule Take 1 capsule (25 mg total) by mouth every 6 (six) hours as needed for itching. 30 capsule 0  . ferrous sulfate 325 (65 FE) MG tablet Take 1 tablet (325 mg total) by mouth daily with breakfast. 30 tablet 0  . folic acid (FOLVITE) Q000111Q MCG tablet Take 800 mcg by mouth daily.    . insulin aspart (NOVOLOG) 100 UNIT/ML injection Inject 0-9 Units into the skin 3 (three) times daily with meals. Per sliding scale 10 mL 11  . insulin glargine (LANTUS) 100 UNIT/ML injection Inject 0.05 mLs (5 Units total) into the skin at bedtime. 10 mL 11  . levETIRAcetam (KEPPRA) 500 MG tablet Take 500 mg by mouth every 12 (twelve) hours.  0  . Multiple Vitamin (MULTIVITAMIN WITH MINERALS) TABS tablet Take 1 tablet by mouth daily.    . mupirocin cream (BACTROBAN) 2 % Apply topically daily. (Patient taking differently: Apply 1 application topically daily as needed (itching, skin irritation). ) 15 g 0  . niacin 500 MG tablet Take 500 mg by mouth at bedtime.    . Oxycodone HCl 10 MG TABS Take 1 tablet (10 mg total) by mouth every 6 (six) hours as needed. 15 tablet 0  . polyethylene glycol (MIRALAX / GLYCOLAX) packet Take 17 g  by mouth 2 (two) times daily. Mix in 8 oz liquid and drink  11  . promethazine (PHENERGAN) 12.5 MG tablet Take 1 tablet (12.5 mg total) by mouth every 6 (six) hours as needed for nausea. 20 tablet 0  . vitamin A 8000 UNIT capsule Take 8,000 Units by mouth daily.     No current facility-administered medications for this visit.    Functional Status:  In your present state of health, do you have any difficulty performing the following activities: 09/18/2015 05/10/2015  Hearing? N -  Vision? N -  Difficulty concentrating or making decisions? N -  Walking or climbing stairs? Y -  Dressing or bathing? N -  Doing errands, shopping? N N    Fall/Depression Screening:  PHQ 2/9 Scores 09/25/2015 06/19/2015 12/13/2014  PHQ - 2 Score 6 0 4  PHQ- 9 Score 15 - 16    Assessment: CSW contacted SNF before visit to ensure that patient was available in order to complete assessment. CSW spoke with patient and HIPPA verifications were received. CSW introduced self, reason for call and of [facility] services. Patient agreeable to this visit. Consent signed. CSW arrived at [facility] but patient was not in room. Patient was in physical therapy. Physical therapist suggested that CSW could complete assessment during physical therapy session. Patient was agreeable. Nurse  informed CSW that last week patient was up and walking around facility. However, patient missed dialysis on Saturday (patient became ill) and therefore did not receive treatment. Patient is a single female who has ongoing health and mental health concerns. Patient reports that she is not well today and that she is extremely overwhelmed. Nurse states that patient has gained excess fluid in her stomach area and has been having stomach issues today. Patient reports "I need to get surgery done on my arm now. I broke my arm a year ago." Nurse states that they were unable to complete surgery before due to her hemoglobin being too low. Physical therapist states that nurse will  be assisting her with getting a MD appointment. Patient has a history of medical noncompliance. She had a Hospice and Palliative care consult while in the hospital. Palliative care follow recommended. Patient informs CSW "I hate getting dialysis but I'm going to do it." Patient has mental health issues and she is very open about this. She states "Why can't I see a psychiatrist here?" Patient states that she has received therapy in the past but has not been on any medications for mental health. She reports interest in being on medications. CSW will send in basket message to PCP to notify her. Patient reports feeling anxious and depressed. She denies any suicidal or homicidal ideations at this time. CSW provided patient with a list of mental health resources. Patient states that she may lose list. CSW informs her that she can provide list again if she loses document. Patient is in need of outpatient mental health treatment. She states that she has a substance abuse history. She admits that she uses both crack/cocaine and alcohol sporadically. Patient reports that she has used alcohol and crack/cocaine 5 to 6 times within the last 12 months. Patient admits to using crack/cocaine before her last hospitalization. Patient does not wish to have a list of local AA/NA meetings with the [facility] area. Patient reports that she lives at her aunt's house but that she does not wish to live there anymore. She states that she does not have a plan in place to gain stable housing once she discharges. Patient states that she does not want to reside at a homeless shelter. CSW provided a list of housing resources to patient and took time to review them with her. She was appreciative for this assistance. Patient is a Jehovah witness and reports that she is having a difficult time with having a blood transfusion while in the hospital stating "I had a bad feeling about it but I did it." Patient is having a difficult time  participating in therapy due to her current health state. However, she was able to participate in physical therapy activities for the most part. Patient made a request that physical therapy be spaced out throughout the week so that she does not have to complete one hour sessions. CSW unable to finish tobacco and risk assessment as patient became overwhelmed with physical therapy. CSW will coordinate care with patient and SNF discharge planner.  Plan: CSW will assist patient in community resources. CSW will work with discharge planner to ensure patient has a safe and stable discharge. CSW will send involvement letter to PCP and a staff in basket message in regards to patients mental health needs.  Eula Fried, BSW, MSW, [email or web address] Phone: [phone] Fax: [phone]

## 2015-09-28 LAB — TSH: TSH: 2.3 u[IU]/mL (ref <=5.90)

## 2015-10-01 IMAGING — DX DG CHEST 2V
2 series · 2 of 2 positions shown · non-contrast
Comparison: 04/27/2014

CLINICAL DATA: Shortness of breath, migraine for 4 days, no bowel
movement

EXAM:
CHEST  2 VIEW

[chest pa]
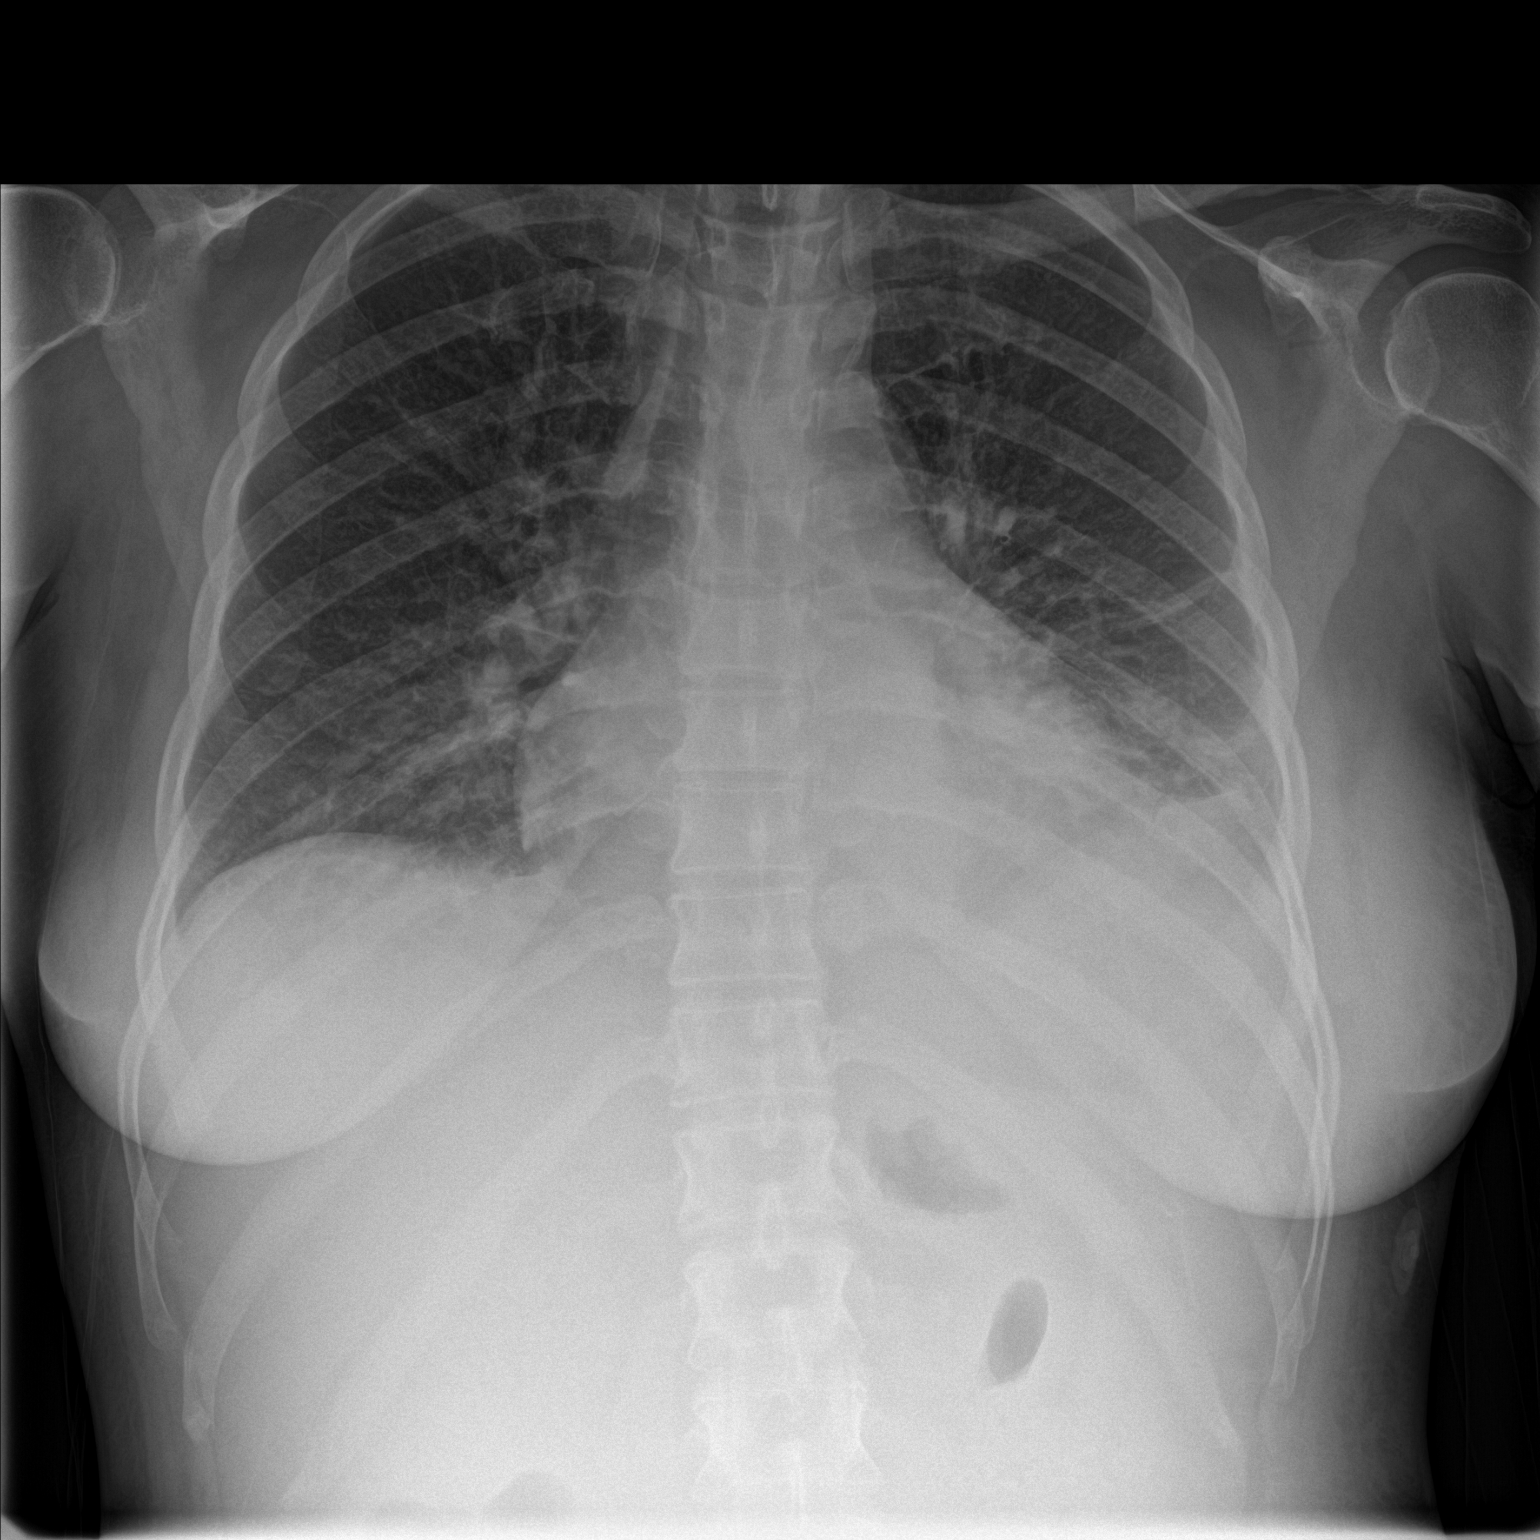

[chest lat]
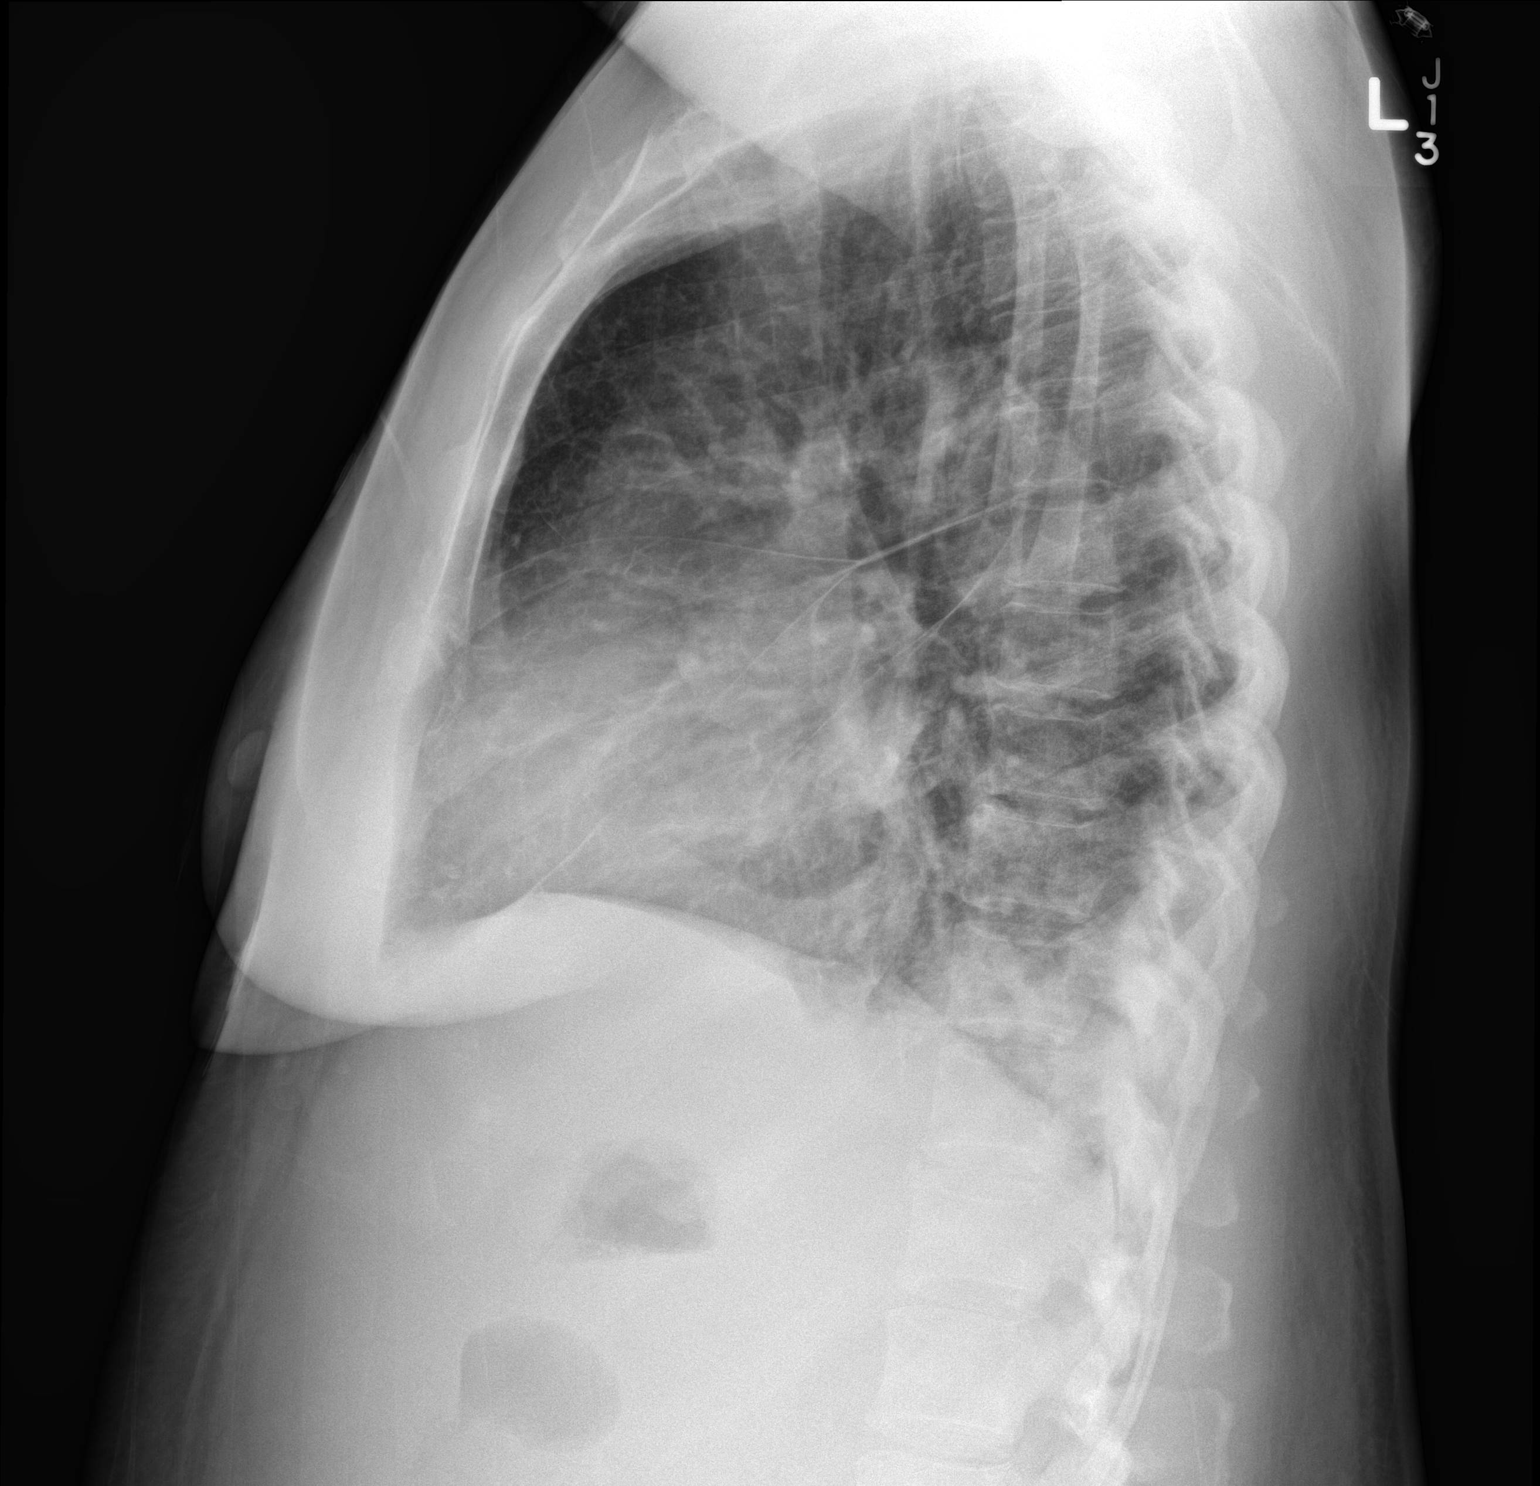

[2 of 2 positions shown; findings below may reference images not displayed]

FINDINGS: There is bilateral diffuse interstitial thickening. There is left
lower lobe airspace disease. There is no significant pleural
effusion or pneumothorax. There is stable cardiomegaly. The osseous
structures are unremarkable.
IMPRESSION: Bilateral diffuse interstitial thickening and cardiomegaly most
concerning for mild pulmonary edema. There is hazy left lower lobe
airspace disease which may reflect atelectasis versus developing
pneumonia.

## 2015-10-02 ENCOUNTER — Other Ambulatory Visit: Payer: Self-pay | Admitting: Licensed Clinical Social Worker

## 2015-10-02 NOTE — Patient Outreach (Signed)
[facility]) Care Management  10/02/2015  CHI CRISE [birth year] [id number]   Assessment-CSW completed outreach to [facility] discharge planner on 10/02/15. Discharge planner states that psych completed appointment with patient at facility last week and that she was prescribed new medications. Discharge planner reports "Cameran is wanting to stay for at least another four months so that she can have surgery on her arm. She is suppose to see her doctor and they will let her know if she can have surgery and when she can have it which will determine how long she can stay." Discharge planner states that she informed patient that she will need to turn over disability check in order to stay at [facility] for multiple month which patient is agreeable with. CSW states that she has informed their business administrative office of this request. SNF discharge planner states that patient's compliance has improve and that she has been attending all scheduled medical appointments including physical therapy and dialysis.   Plan-CSW will complete next outreach on 10/09/15.  Eula Fried, BSW, MSW, [email or web address] Phone: [phone] Fax: [phone]

## 2015-10-05 ENCOUNTER — Encounter: Payer: Self-pay | Admitting: Internal Medicine

## 2015-10-05 NOTE — Progress Notes (Signed)
Patient ID: [name], female   DOB: [birth year], 50 y.o.   MRN: [id number]    DATE:  Location:  [facility]    Place of Service: SNF (31)   Extended Emergency Contact Information Primary Emergency Contact: Clent Demark, [place] [address] [place] Phone: [phone] Relation: Aunt Secondary Emergency Contact: Candis Schatz [place] Phone: [phone] Mobile Phone: [phone] Relation: Son  Advanced Directive information Does patient have an advance directive?: Yes, Type of Advance Directive: Living will, Does patient want to make changes to advanced directive?: No - Patient declined  Chief Complaint  Patient presents with  . Acute Visit    HPI:   Past Medical History  Diagnosis Date  . Neuropathy ([place])   . Glaucoma   . Bipolar 1 disorder ([place])   . Refusal of blood transfusions as patient is Jehovah's Witness   . TIA (transient ischemic attack) 2010  . MRSA infection ?2014    "on the back of my head; spread throughout my bloodstream"  . Bipolar disorder, unspecified ([place]) 02/06/2014  . High cholesterol   . 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 Hepatitis C   . Chronic pain syndrome   . ESRD on hemodialysis ([facility])   . Schizoaffective disorder, unspecified condition 02/08/2014  . Seizure [facility]) dx'd 2014    "don't know what kind; last one was ~ 04/2014"  . Chronic combined systolic and diastolic CHF (congestive heart failure) (HCC)     EF 40% by echo and 48% by stress test  . Anemia   . Hypertension   . Depression   . Anxiety   . GERD (gastroesophageal reflux disease)   . History of hiatal hernia   . Headache     sinus headaches  . History of vitamin D deficiency 06/20/2015  . Stroke [facility])     TIA 2009    Past Surgical History  Procedure Laterality Date  . Amputation Left 05/21/2013    Procedure: Left Midfoot AMPUTATION;  Surgeon: Newt Minion, MD;  Location: [place];  Service: Orthopedics;  Laterality: Left;  Left Midfoot Amputation  . Tubal ligation    . Colonoscopy with propofol N/A 06/22/2013    Procedure: COLONOSCOPY WITH PROPOFOL;  Surgeon: Jeryl Columbia, MD;  Location: [facility] ENDOSCOPY;  Service: Endoscopy;  Laterality: N/A;  . Abdominal hysterectomy  2014  . Tonsillectomy  1970's  . Refractive surgery Bilateral 2013  . Tubal ligation  2000  . Abdominal hysterectomy  2010  . Foot amputation Left     due diabetic neuropathy, could not feel ulcer on bottom of foot  . Eye surgery Bilateral 2010    Lasik  . Av fistula placement Left 05/04/2015    Procedure: ARTERIOVENOUS (AV) GRAFT INSERTION;  Surgeon: Angelia Mould, MD;  Location: [place];  Service: Vascular;  Laterality: Left;  . Esophagogastroduodenoscopy N/A 05/11/2015    Procedure: ESOPHAGOGASTRODUODENOSCOPY (EGD);  Surgeon: Laurence Spates, MD;  Location: [facility] ENDOSCOPY;  Service: Endoscopy;  Laterality: N/A;    Patient Care Team: Maurice Small, MD as PCP - General (Family Medicine) Greg Cutter, LCSW as [place] Management (Licensed Clinical Social Worker) Valente David, RN as [facility]  Social History   Social History  . Marital Status: Unknown    Spouse Name: N/A  . Number of Children: N/A  . Years of  Education: N/A   Occupational History  . Not on file.   Social History Main Topics  . Smoking status: Current Some Day Smoker -- 1.00 packs/day for 20 years    Types: Cigarettes    Last Attempt to Quit: 03/31/2015  . Smokeless tobacco: Never Used     Comment: "stopped smoking in ~ 2013; restarted last week; stopped again 06/17/2014"  . Alcohol Use: No     Comment: 05/01/15 patient denies alcohol use.  . Drug Use: Yes    Special: "Crack" cocaine, Cocaine     Comment: last smoked crack 11-07-14  . Sexual Activity: Not Currently    Birth Control/ Protection: Abstinence   Other Topics Concern  . Not on file   Social History  Narrative   ** Merged History Encounter **         reports that she has been smoking Cigarettes.  She has a 20 pack-year smoking history. She has never used smokeless tobacco. She reports that she uses illicit drugs ("Crack" cocaine and Cocaine). She reports that she does not drink alcohol.  Immunization History  Administered Date(s) Administered  . Influenza,inj,Quad PF,36+ Mos 05/19/2013, 04/23/2014, 05/05/2015  . Pneumococcal Polysaccharide-23 02/09/2013, 06/03/2013  . Tdap 05/19/2013    Allergies  Allergen Reactions  . Gabapentin Itching and Swelling  . Gabapentin Swelling  . Lyrica [Pregabalin] Swelling    Facial and leg swelling  . Other     NO BLOOD PRODUCTS. PT IS A JEHOVAH WITNESS  . Penicillins Hives    Has patient had a PCN reaction causing immediate rash, facial/tongue/throat swelling, SOB or lightheadedness with hypotension: Yes Has patient had a PCN reaction causing severe rash involving mucus membranes or skin necrosis: No Has patient had a PCN reaction that required hospitalization No Has patient had a PCN reaction occurring within the last 10 years: No If all of the above answers are "NO", then may proceed with Cephalosporin use. Pt tolerates cefepime and ceftriaxone  . Saphris [Asenapine] Swelling    Swelling of the face  . Saphris [Asenapine] Swelling    Facial swelling  . Tramadol Swelling    Causes legs to swell  . Tramadol Swelling    Leg swelling    Medications: Patient's Medications  New Prescriptions   No medications on file  Previous Medications   ACETAMINOPHEN (TYLENOL) 325 MG TABLET    Take 2 tablets (650 mg total) by mouth every 6 (six) hours as needed for mild pain (or Fever >/= 101).   AMLODIPINE (NORVASC) 10 MG TABLET    Take 1 tablet (10 mg total) by mouth at bedtime.   ATORVASTATIN (LIPITOR) 40 MG TABLET    Take 1 tablet (40 mg total) by mouth at bedtime.   CHOLECALCIFEROL (VITAMIN D3) 1000 UNITS CAPS    Take 1 capsule by mouth daily.     COENZYME Q10 (COQ10) 100 MG CAPS    Take 1 capsule by mouth daily.   CYANOCOBALAMIN (B-12) 2500 MCG TABS    Take 1 capsule by mouth daily.   DIPHENHYDRAMINE (BENADRYL) 25 MG CAPSULE    Take 1 capsule (25 mg total) by mouth every 6 (six) hours as needed for itching.   FERROUS SULFATE 325 (65 FE) MG TABLET    Take 1 tablet (325 mg total) by mouth daily with breakfast.   FOLIC ACID (FOLVITE) Q000111Q MCG TABLET    Take 800 mcg by mouth daily.   INSULIN ASPART (NOVOLOG) 100 UNIT/ML INJECTION    Inject 0-9 Units  into the skin 3 (three) times daily with meals. Per sliding scale   INSULIN GLARGINE (LANTUS) 100 UNIT/ML INJECTION    Inject 0.05 mLs (5 Units total) into the skin at bedtime.   LEVETIRACETAM (KEPPRA) 500 MG TABLET    Take 500 mg by mouth every 12 (twelve) hours.   MULTIPLE VITAMIN (MULTIVITAMIN WITH MINERALS) TABS TABLET    Take 1 tablet by mouth daily.   MUPIROCIN CREAM (BACTROBAN) 2 %    Apply topically daily.   NIACIN 500 MG TABLET    Take 500 mg by mouth at bedtime.   OXYCODONE HCL 10 MG TABS    Take 1 tablet (10 mg total) by mouth every 6 (six) hours as needed.   POLYETHYLENE GLYCOL (MIRALAX / GLYCOLAX) PACKET    Take 17 g by mouth 2 (two) times daily. Mix in 8 oz liquid and drink   PROMETHAZINE (PHENERGAN) 12.5 MG TABLET    Take 1 tablet (12.5 mg total) by mouth every 6 (six) hours as needed for nausea.   VITAMIN A 8000 UNIT CAPSULE    Take 8,000 Units by mouth daily.  Modified Medications   No medications on file  Discontinued Medications   No medications on file    Review of Systems  Filed Vitals:   10/05/15 1553  BP: 179/108   There is no weight on file to calculate BMI.  Physical Exam   Labs reviewed: Admission on 09/17/2015, Discharged on 09/20/2015  No results displayed because visit has over 200 results.      Dg Elbow Complete Right  09/17/2015  CLINICAL DATA:  50 year old female with fall EXAM: RIGHT ELBOW - COMPLETE 3+ VIEW COMPARISON:  Radiograph dated  07/22/2014 FINDINGS: There is an old supracondylar fracture of right humerus with with widening of the fracture line and nonunion. There is fragmentation and non bridging callus along the fracture. There is approximately 1.5 cm lateral displacement of the distal fracture fragment similar to prior study. No new fracture identified. There is soft tissue swelling of the elbow with a small joint effusion. 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 Stable 1.5 cm lateral displacement of the distal fracture fragment. Electronically Signed   By: Anner Crete M.D.   On: 09/17/2015 03:38   Dg Chest Port 1 View  09/17/2015  CLINICAL DATA:  History of end-stage renal disease, missed multiple dialysis appointments. History of diabetes, hypertension. EXAM: PORTABLE CHEST 1 VIEW COMPARISON:  Chest radiograph November 22, 2014 FINDINGS: Cardiac silhouette is mildly enlarged, mediastinal silhouette is unremarkable. Pulmonary vasculature appears normal. No pleural effusion or focal consolidation. No pneumothorax. Soft tissue planes and included osseous structures are nonsuspicious. IMPRESSION: Mild cardiomegaly, no acute pulmonary process. Electronically Signed   By: Elon Alas M.D.   On: 09/17/2015 02:41   Dg Abd Portable 1v  09/17/2015  CLINICAL DATA:  Abdominal pain and rectal bleeding. EXAM: PORTABLE ABDOMEN - 1 VIEW COMPARISON:  CT abdomen and pelvis 05/12/2015 FINDINGS: Scattered gas and stool throughout the colon. No small or large bowel distention. Calcifications in the pelvis are likely vascular. No radiopaque stones. Visualized bones appear intact. IMPRESSION: Normal nonobstructive bowel gas pattern. Electronically Signed   By: Lucienne Capers M.D.   On: 09/17/2015 21:15   Dg Foot Complete Left  09/17/2015  CLINICAL DATA:  50 year old female with fall. EXAM: LEFT FOOT - COMPLETE 3+ VIEW COMPARISON:  Radiograph dated 16 16  FINDINGS: There is stable appearing  transmetatarsal amputation of the left foot. A small bone fragment noted adjacent to the base of the first metatarsal similar to prior study. There is no acute fracture. No dislocation. There is diffuse soft tissue thickening at the amputation site with mild diffuse subcutaneous edema. No soft tissue gas identified. 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 No acute osseous pathology. Electronically Signed   By: Anner Crete M.D.   On: 09/17/2015 03:41     Assessment/Plan

## 2015-10-08 NOTE — Progress Notes (Signed)
This encounter was created in error - please disregard.

## 2015-10-12 ENCOUNTER — Other Ambulatory Visit: Payer: Self-pay | Admitting: Licensed Clinical Social Worker

## 2015-10-12 NOTE — Patient Outreach (Signed)
[facility]) Care Management  10/12/2015  [name] [birth year] [id number]   Assessment-CSW completed outreach to SNF social worker on 10/12/15 but was unable to reach her. CSW left a HIPPA compliant voice message encouraging return call.  Plan-CSW will await to hear back from SNF social worker in order to gain updates on care plan for patient.  Eula Fried, BSW, MSW, [email or web address] Phone: [phone] Fax: [phone]

## 2015-10-15 IMAGING — US US EXTREM LOW*R* LIMITED
1 series · 14 of 18 positions shown · non-contrast
Comparison: None.

CLINICAL DATA: Posterior RIGHT leg and calf pain. Palpable
abnormality. Initial encounter

EXAM:
ULTRASOUND RIGHT LOWER EXTREMITY LIMITED
TECHNIQUE: Ultrasound examination of the lower extremity soft tissues was
performed in the area of clinical concern.

[Series 1: us extrem low*right* limited · 0.06mm/px · 14 of 18 slices shown]
[im 1/18]
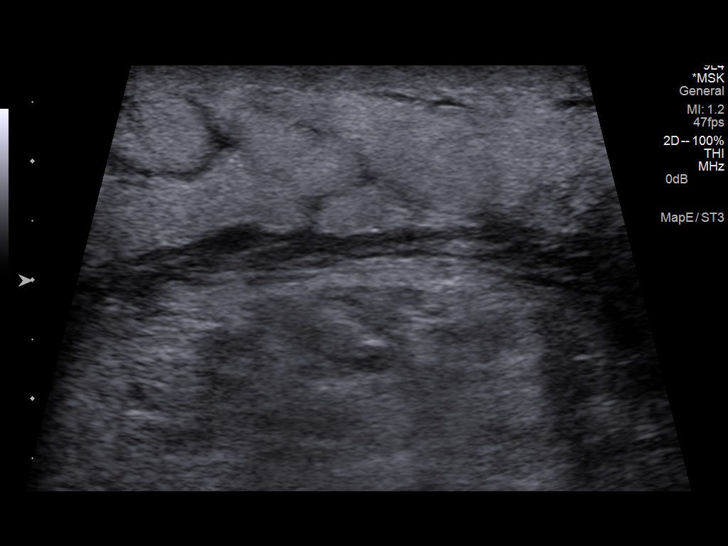
[im 2/18]
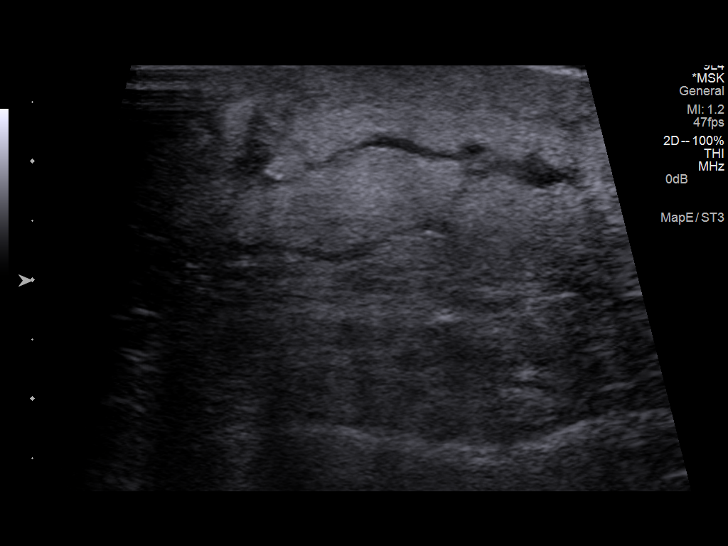
[im 4/18]
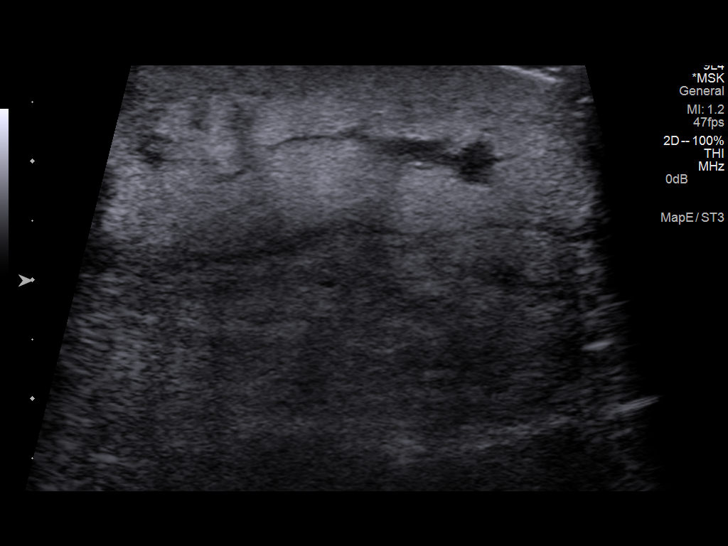
[im 5/18]
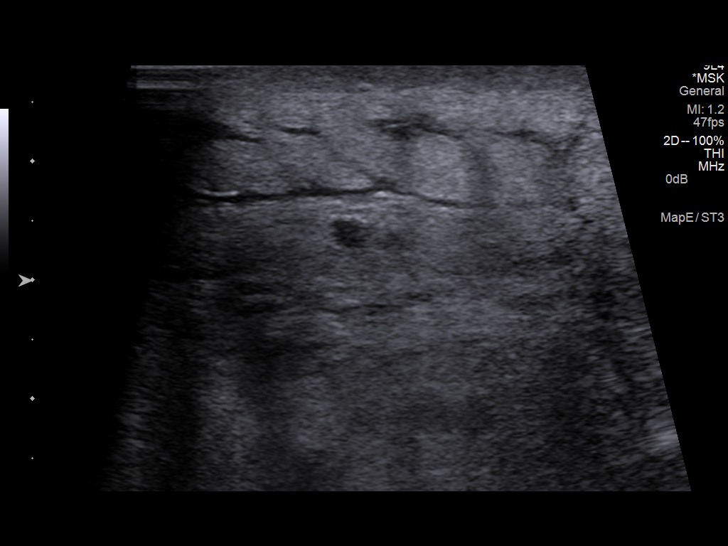
[im 6/18]
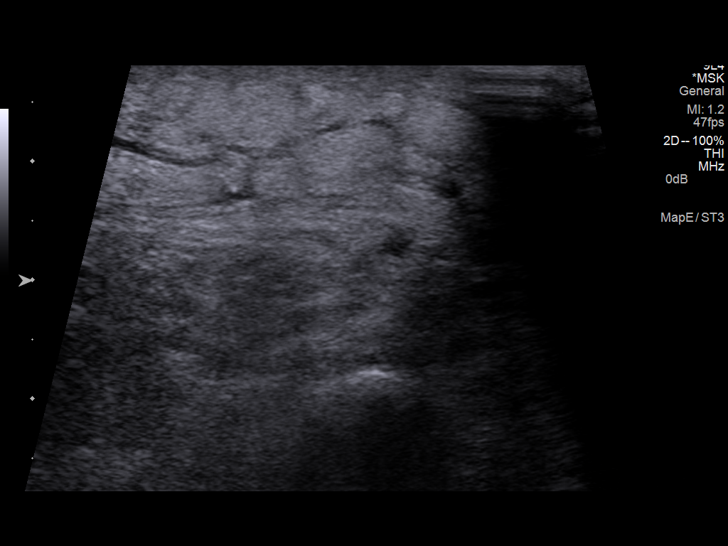
[im 8/18]
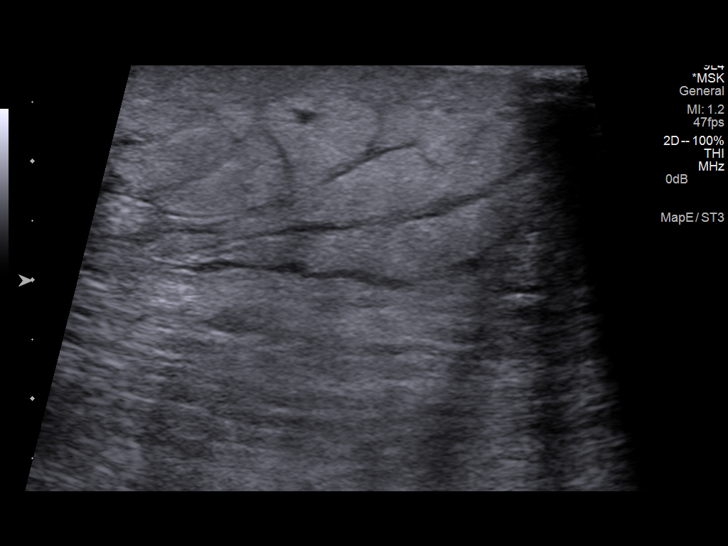
[im 9/18]
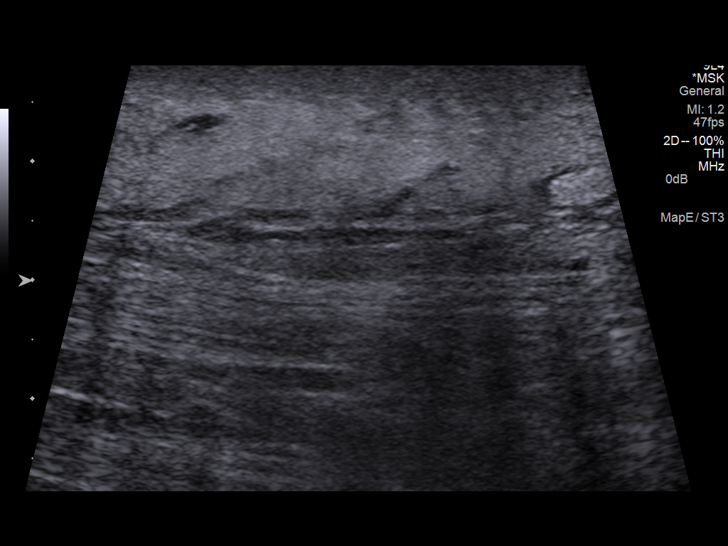
[im 10/18]
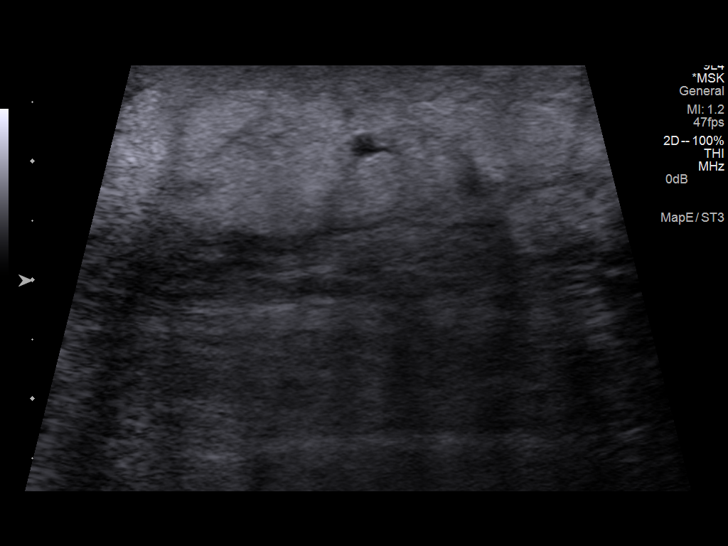
[im 11/18]
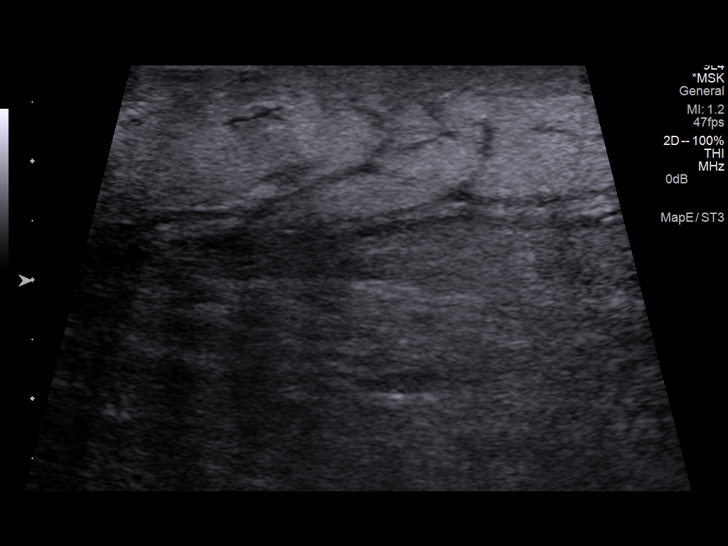
[im 13/18]
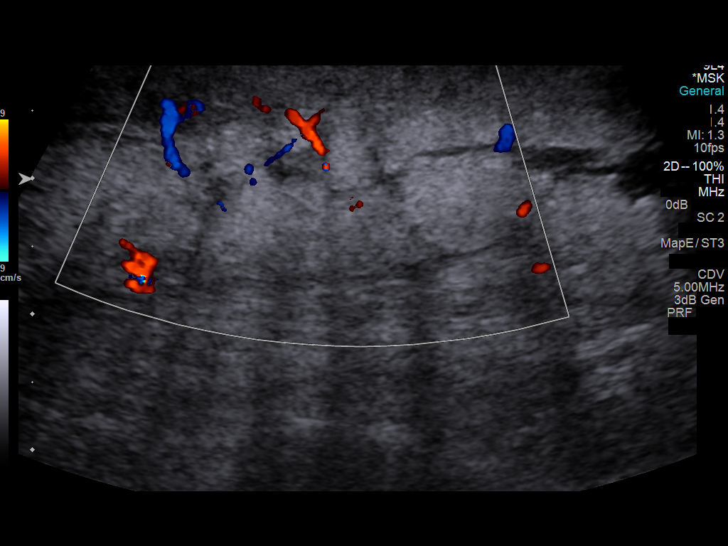
[im 14/18]
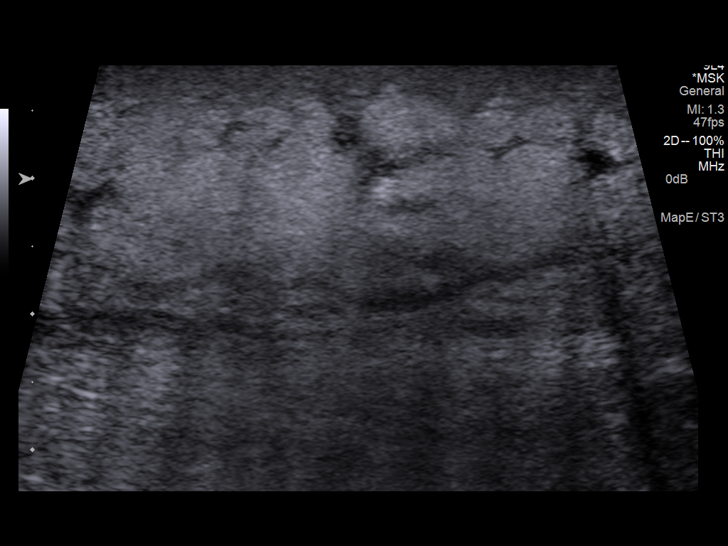
[im 15/18]
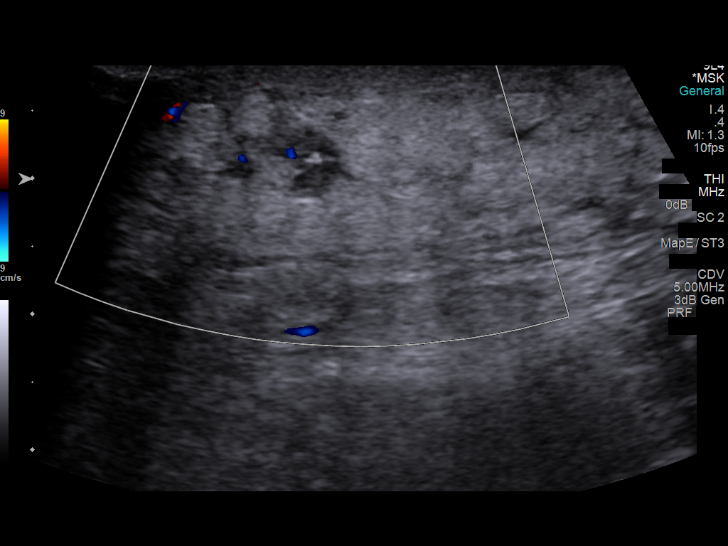
[im 17/18]
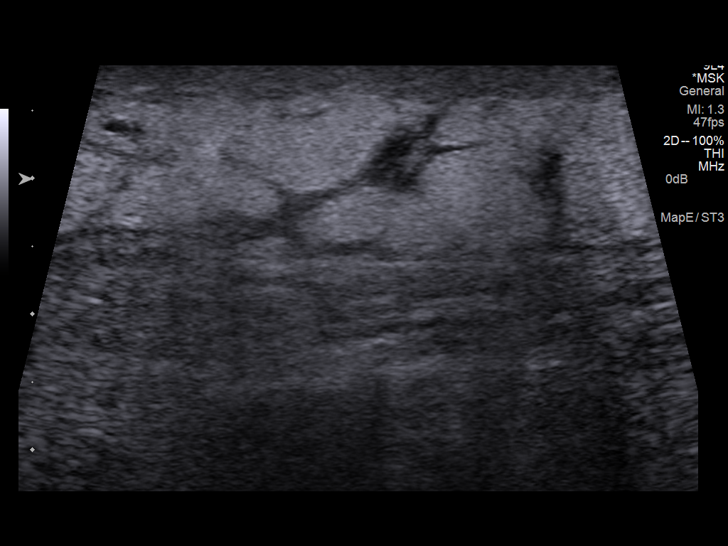
[im 18/18]
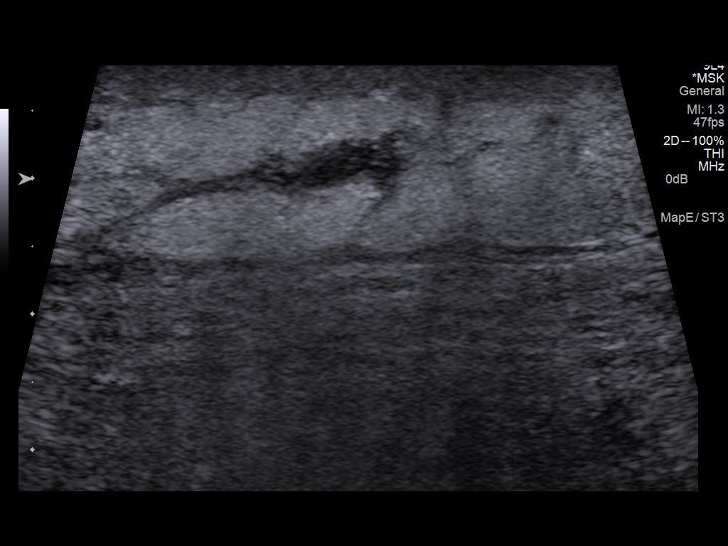

[14 of 18 positions shown; findings below may reference images not displayed]

FINDINGS: Subcutaneous edema is present in the RIGHT calf. No abscess or focal
fluid collections.
IMPRESSION: Subcutaneous edema of the RIGHT leg.

## 2015-10-15 IMAGING — US US RENAL
1 series · 14 of 25 positions shown · non-contrast
Comparison: June 05, 2014

CLINICAL DATA: Acute renal failure

EXAM:
RENAL/URINARY TRACT ULTRASOUND COMPLETE

[Series 1: us renal · 0.24mm/px · 14 of 39 slices shown]
[im 1/39]
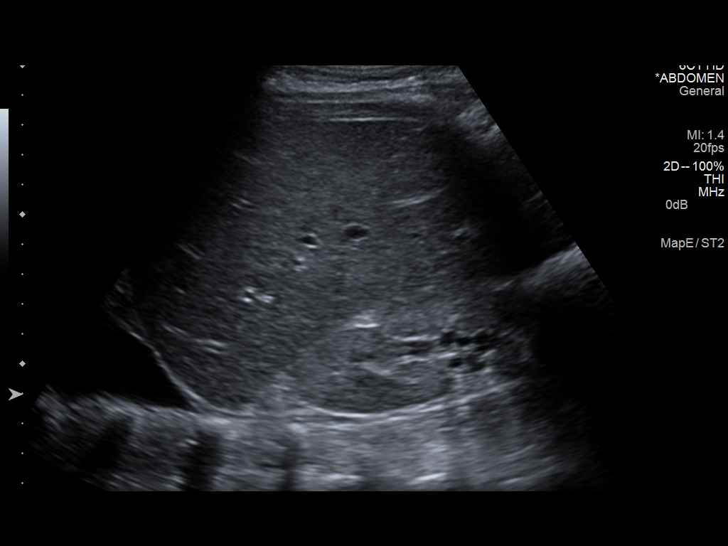
[im 4/39]
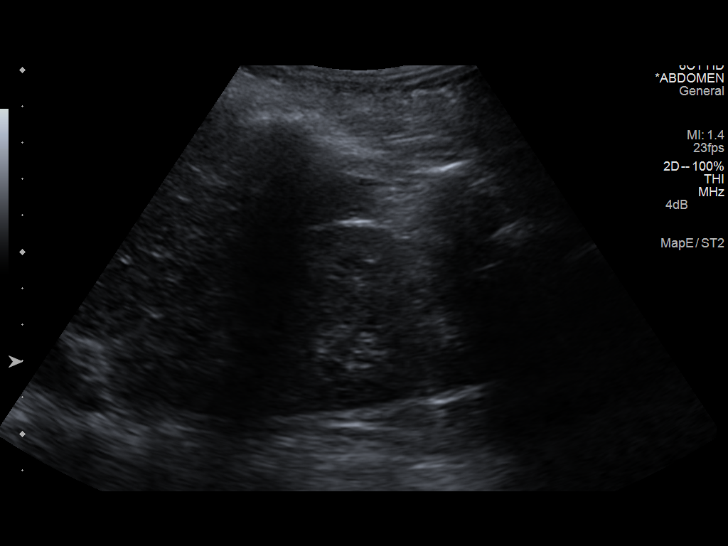
[im 7/39]
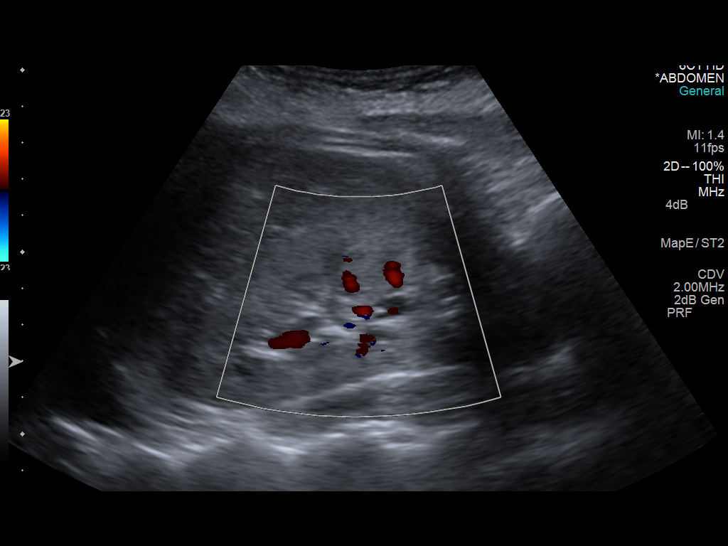
[im 10/39]
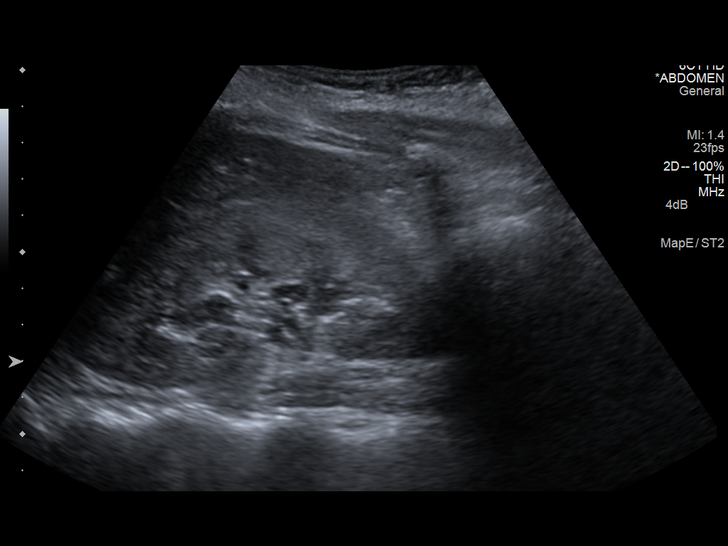
[im 13/39]
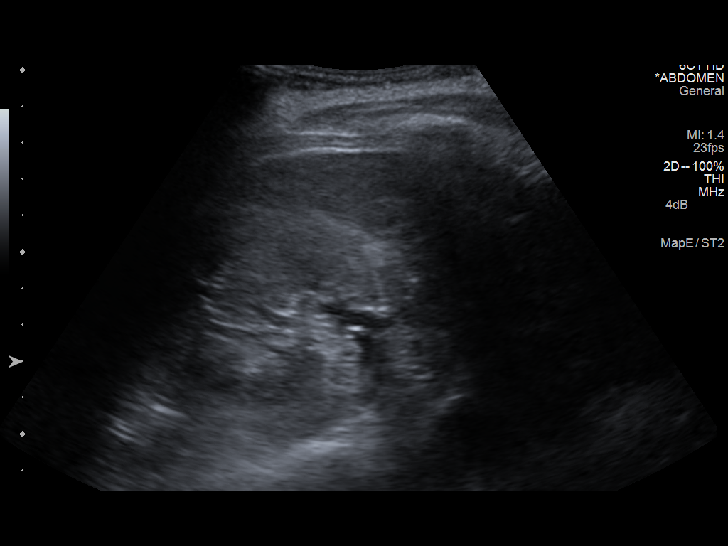
[im 15/39]
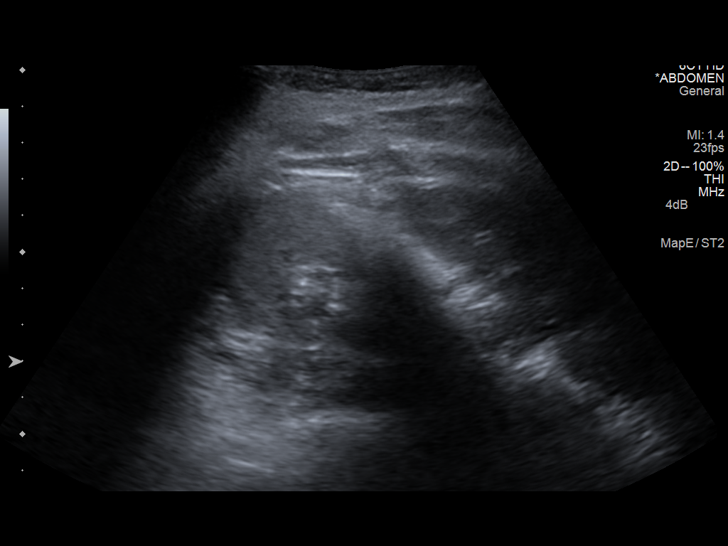
[im 18/39]
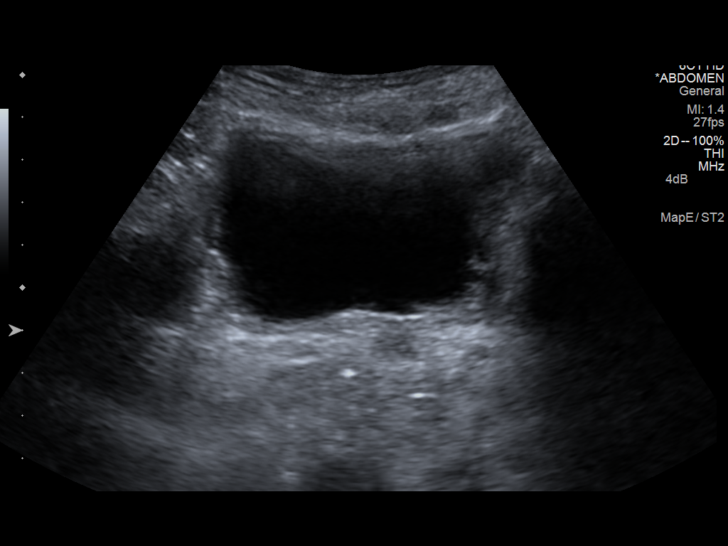
[im 21/39]
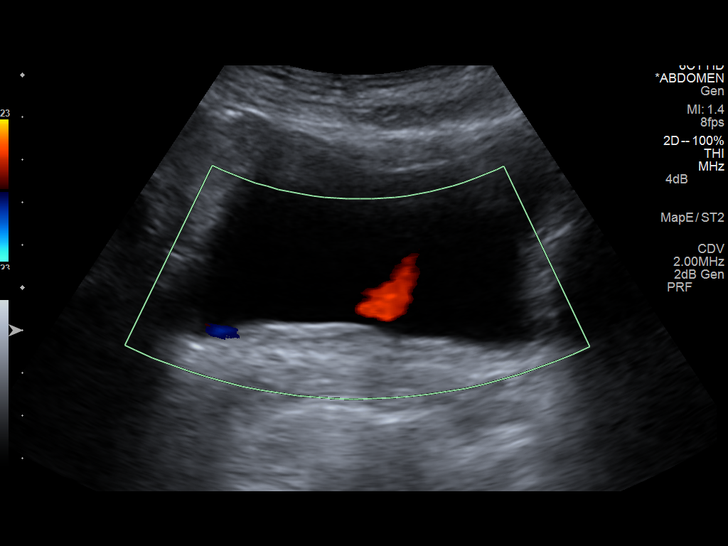
[im 24/39]
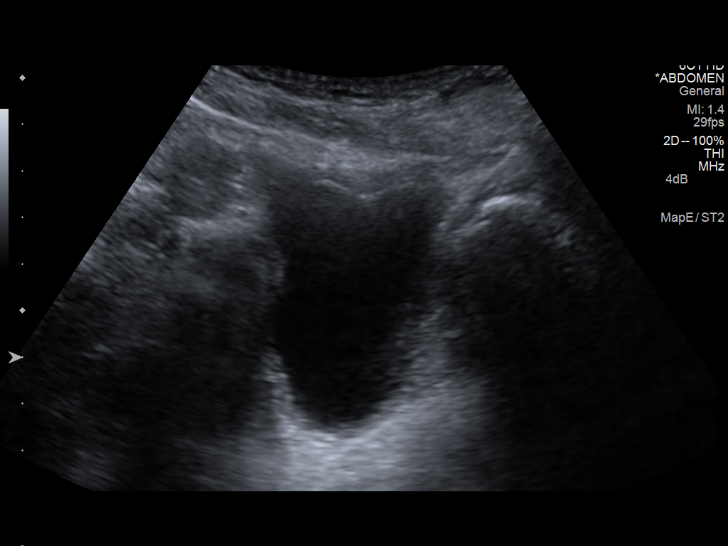
[im 26/39]
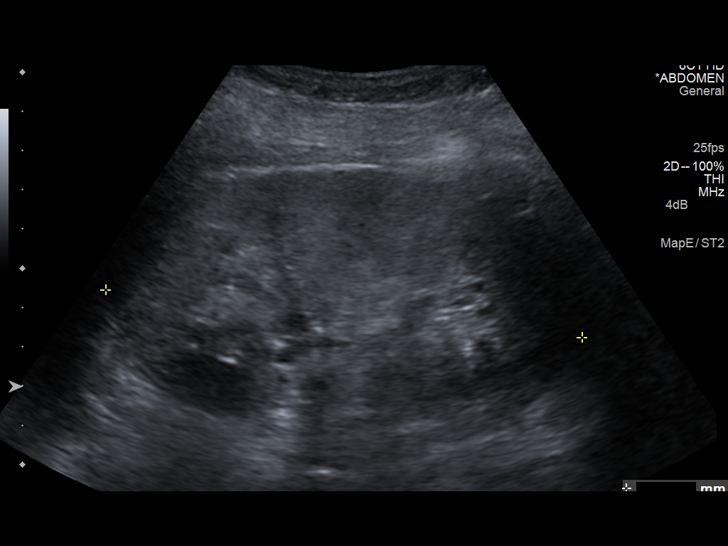
[im 29/39]
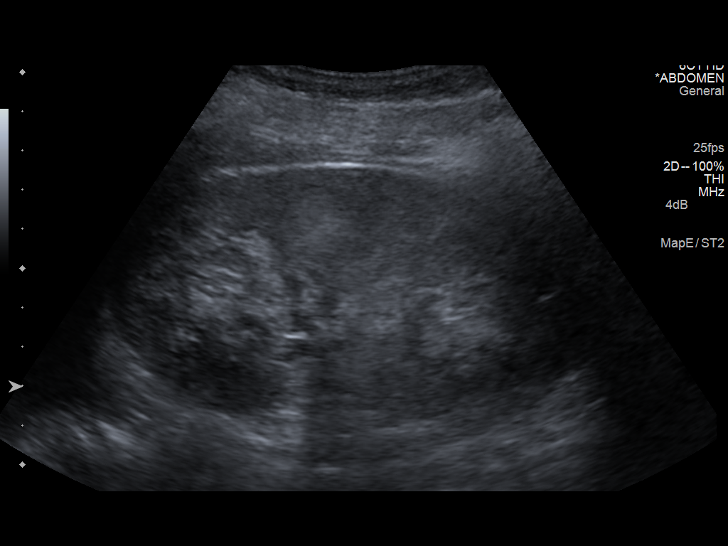
[im 32/39]
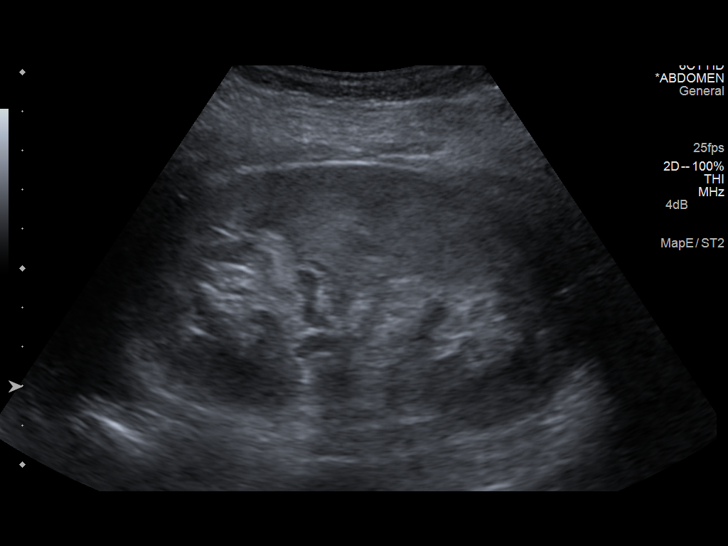
[im 35/39]
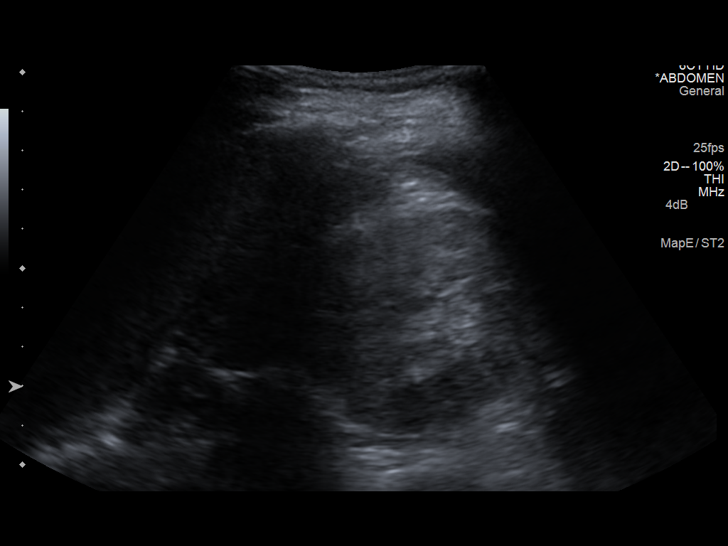
[im 39/39]
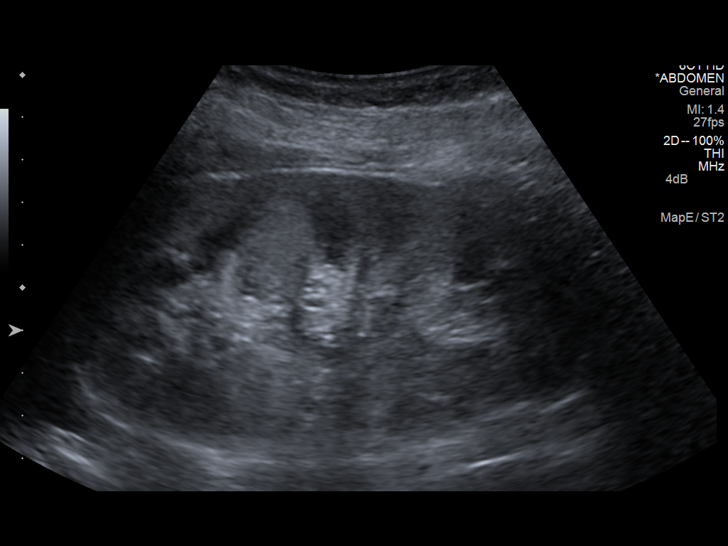

[14 of 25 positions shown; findings below may reference images not displayed]

FINDINGS: Right Kidney:

Length: 12.3 cm. Echogenicity is increased. Renal cortical thickness
is normal. No mass, perinephric fluid, or hydronephrosis visualized.
No sonographically demonstrable calculus or ureterectasis.

Left Kidney:

Length: 12.2 cm. Echogenicity is increased. Renal cortical thickness
is normal. No mass, perinephric fluid, or hydronephrosis visualized.
No sonographically demonstrable calculus or ureterectasis.

Bladder:

Appears normal for degree of bladder distention.

There is mild ascites adjacent to the spleen. There are small
pleural effusions noted bilaterally.
IMPRESSION: Echogenic kidneys, a finding that may be associated with medical
renal disease. This finding was also noted on prior study. No
obstructing foci are identified in either kidney. There is mild
ascites as well as small pleural effusions bilaterally.

## 2015-10-16 ENCOUNTER — Other Ambulatory Visit: Payer: Self-pay | Admitting: Licensed Clinical Social Worker

## 2015-10-16 IMAGING — DX DG ELBOW COMPLETE 3+V*R*
4 series · 4 of 4 positions shown · non-contrast
Comparison: 04/22/2014

CLINICAL DATA: Chronic right elbow pain

EXAM:
RIGHT ELBOW - COMPLETE 3+ VIEW

[elbow ap]
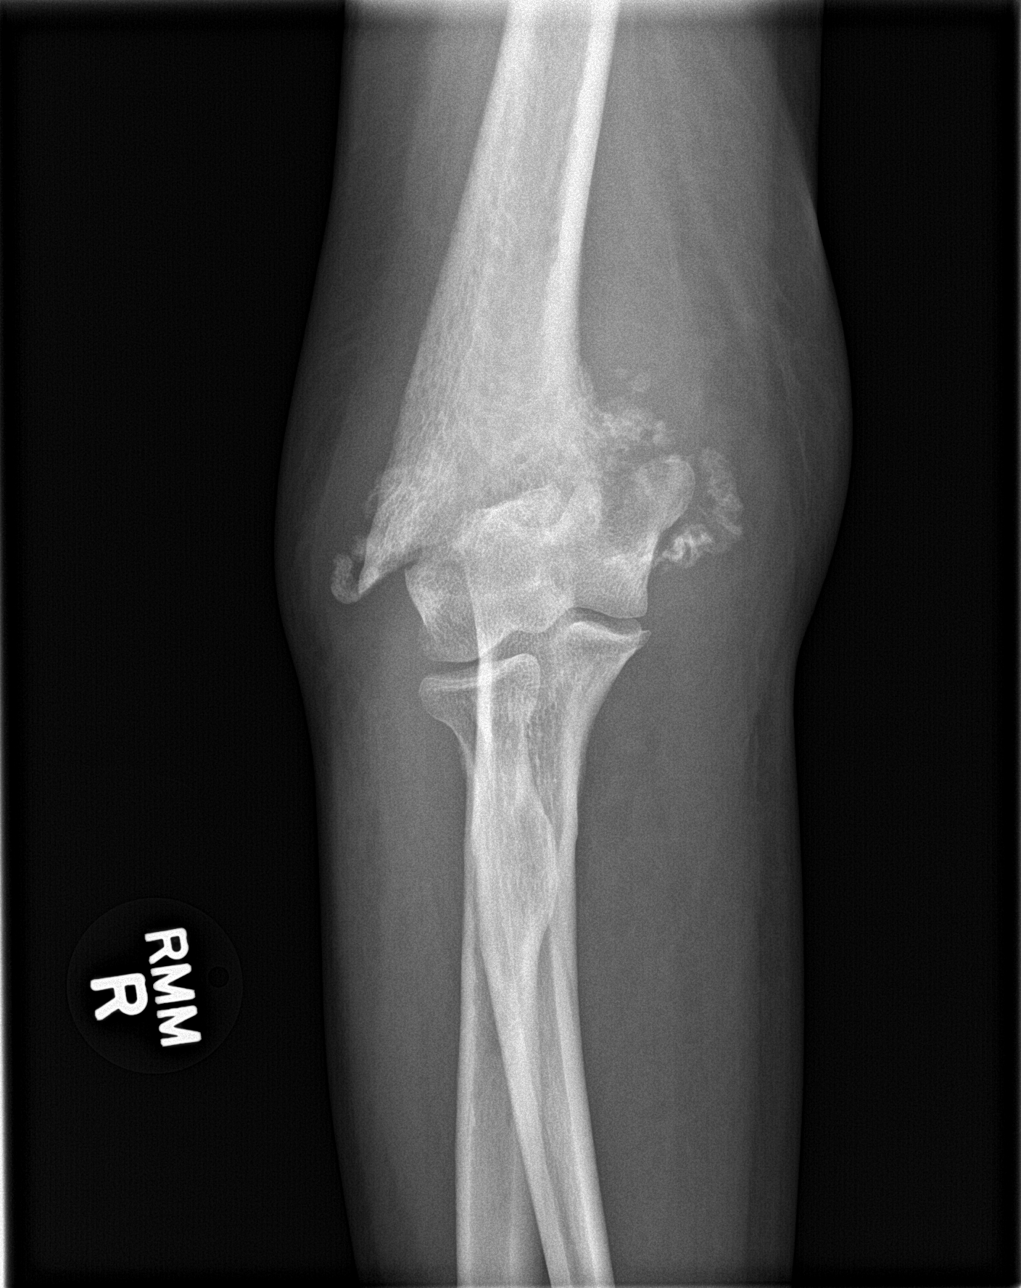

[elbow obl (1 of 2)]
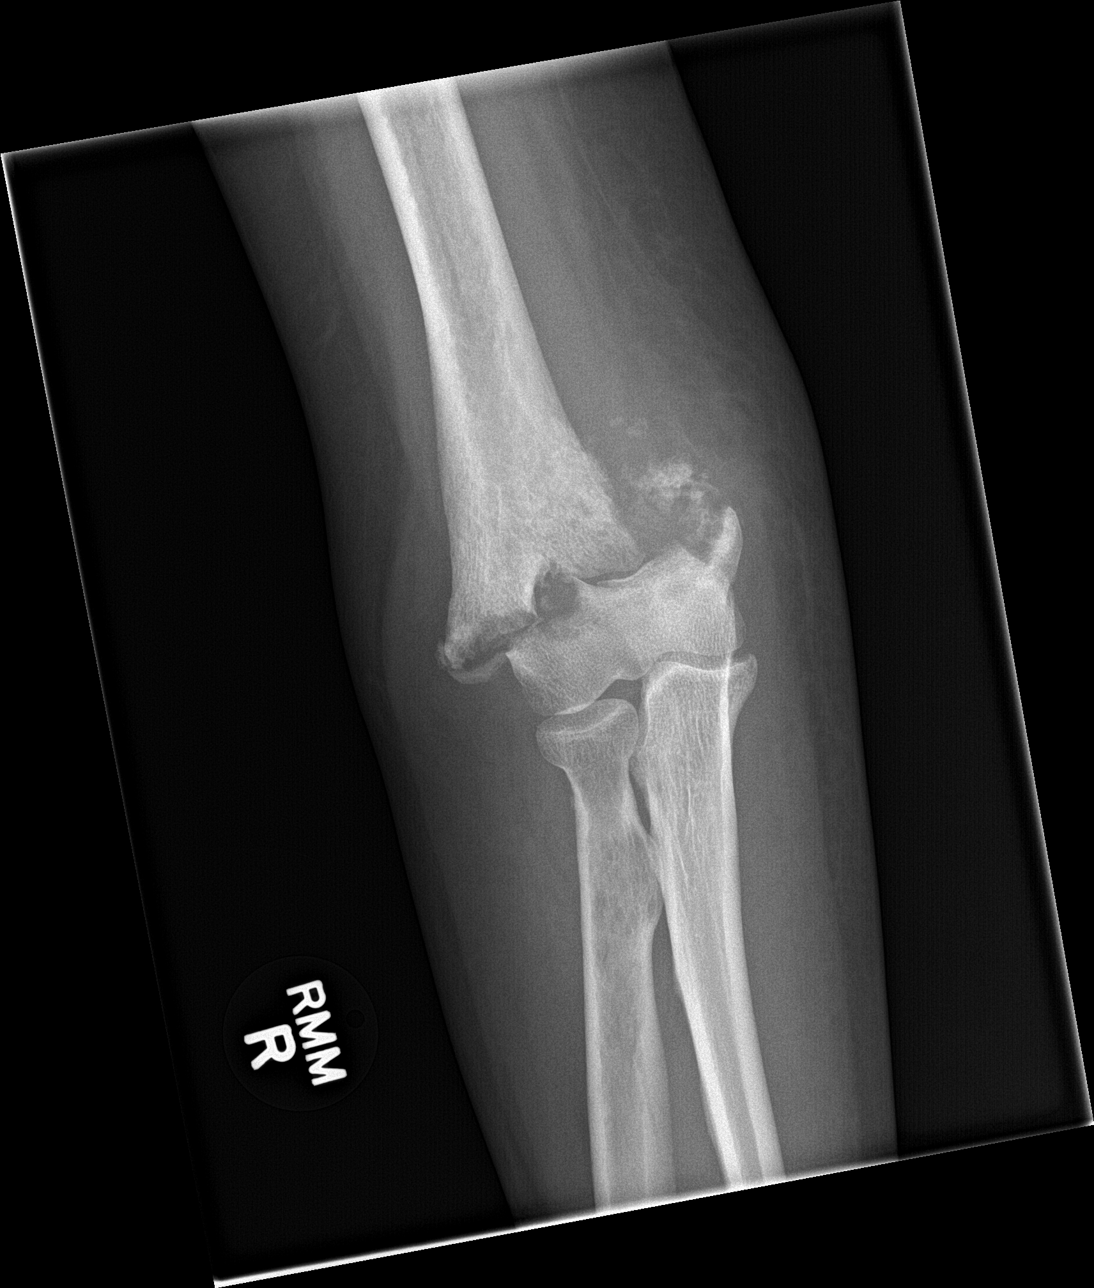

[elbow obl (2 of 2)]
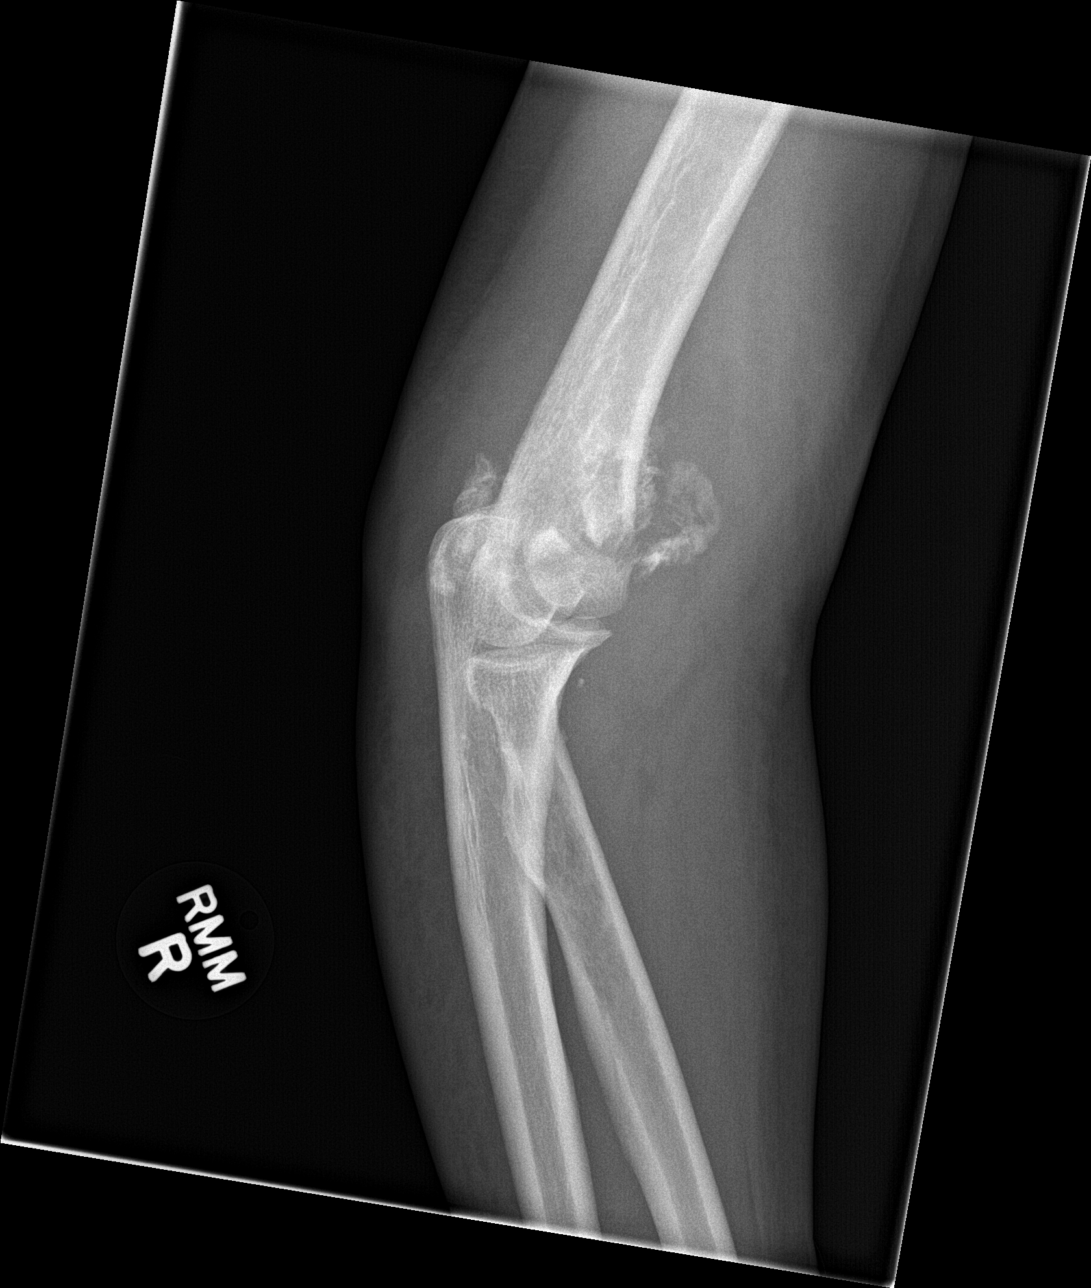

[elbow lat]
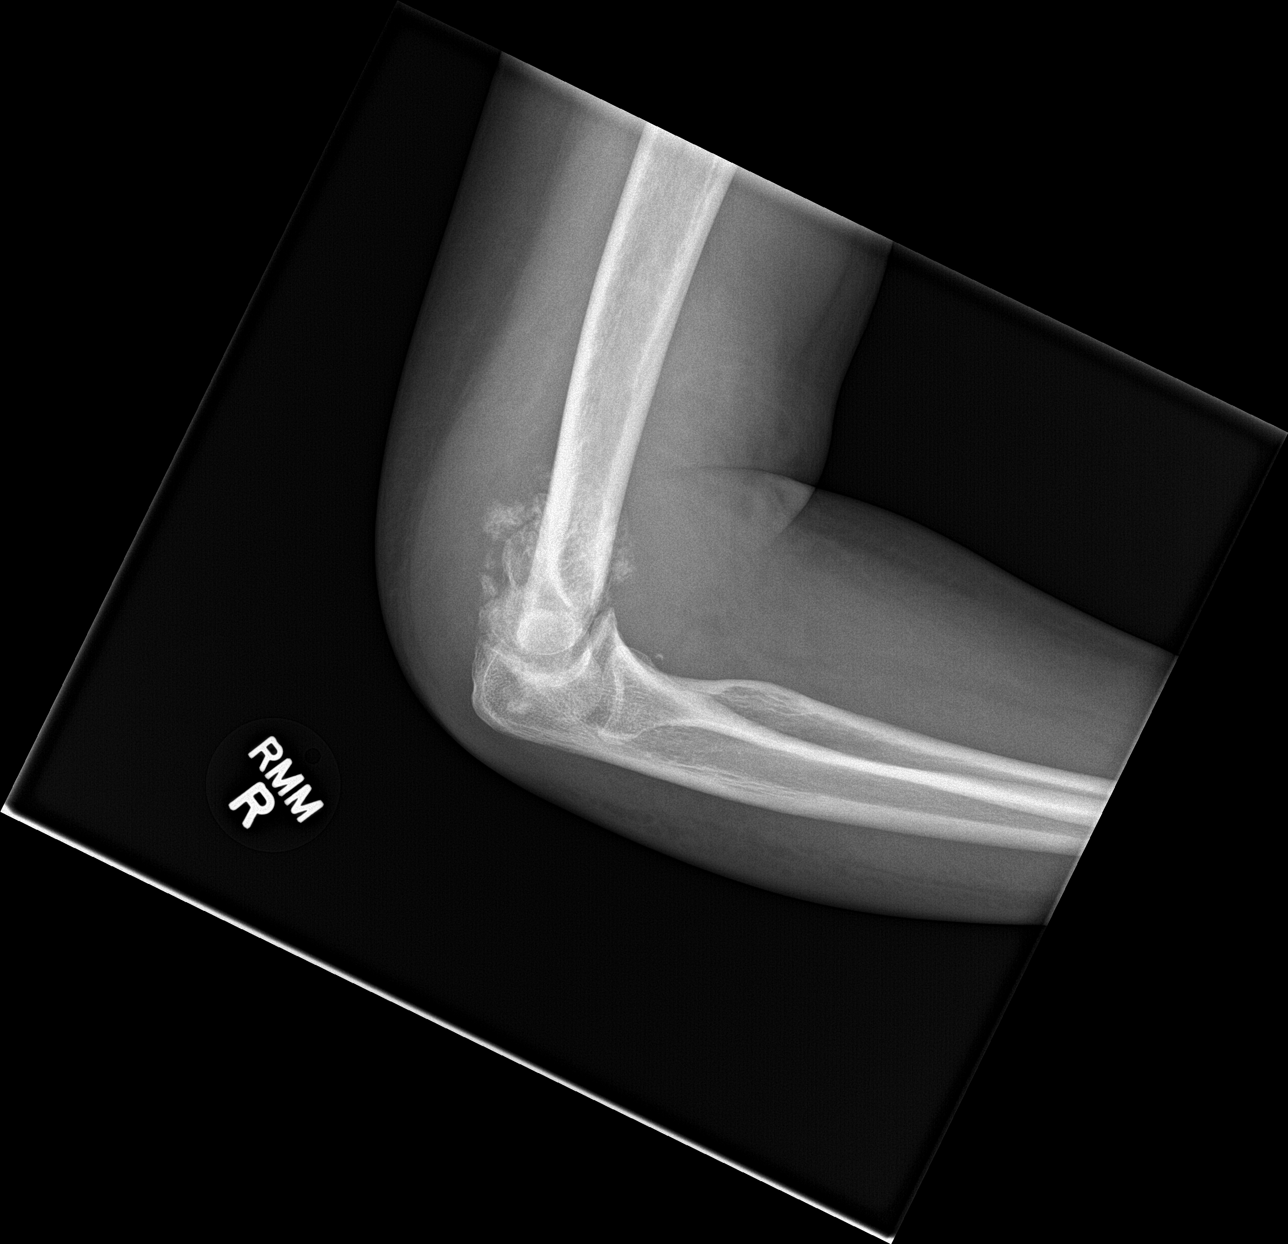

[4 of 4 positions shown; findings below may reference images not displayed]

FINDINGS: Four views of the right elbow submitted. Persistent mild displaced
supracondylar floor fracture in distal right humerus with nonunion.
There is increasing callus formation.
IMPRESSION: Persistent mild displaced supracondylar fracture in distal right
humerus with nonunion.

## 2015-10-16 NOTE — Patient Outreach (Signed)
[facility] Care Management  10/16/2015  NAI MAGAZINE 1965-09-04 [id number]   Assessment-CSW completed outreach to SNF social worker and she answered. SNF social worker states that her last day at facility will be on 10/17/15 but that her assistant will take her place and is aware of the case and of [facility] social worker. She reports that Darleny informed her that she is unable to see physician to schedule surgery for arm as they are no available appointments within the next 30 days. Patient plans to stay at SNF and has successfully applied for long term Medicaid that will allow her to stay at SNF for up to six months.  Plan-CSW will discuss case further with new SNF social worker. CSW will update RNCM.  Eula Fried, BSW, MSW, [email or web address] Phone: [phone] Fax: [phone]

## 2015-10-25 ENCOUNTER — Other Ambulatory Visit: Payer: Self-pay | Admitting: Licensed Clinical Social Worker

## 2015-10-25 NOTE — Patient Outreach (Signed)
[facility]) Care Management  10/25/2015  [name] [birth year] [id number]   Assessment- CSW completed outreach to new social worker at SNF on 10/25/15. CSW unable to reach her but left a HIPPA compliant voice message encouraging her to return call once available.  Plan-CSW will await for return call in order to gain discharge updates on patient.  Eula Fried, BSW, MSW, [email or web address] Phone: [phone] Fax: [phone]

## 2015-10-26 ENCOUNTER — Other Ambulatory Visit: Payer: Self-pay | Admitting: Licensed Clinical Social Worker

## 2015-10-26 NOTE — Patient Outreach (Signed)
[place] [facility]) Care Management  10/26/2015  SELISA PEASE [birth year] [id number]   Assessment-CSW received voice message from new SNF social worker stating that there are no plans for discharge at this time and if there any other questions to contact her back. CSW completed call to SNF social worker and left a HIPPA compliant voice message asking for further details and if patient will stay long term at facility.  Plan-CSW has already notified RNCM. RNCM has discharged patient from caseload. CSW will await to hear back from SNF discharge planner.  Eula Fried, BSW, MSW, [email or web address] Phone: [phone] Fax: [phone]

## 2015-10-28 LAB — BASIC METABOLIC PANEL
CREATININE: 9.6 mg/dL — AB (ref 0.5–1.1)
GLUCOSE: 266 mg/dL
POTASSIUM: 5.9 mmol/L — AB (ref 3.4–5.3)
SODIUM: 126 mmol/L — AB (ref 137–147)

## 2015-10-28 LAB — CBC AND DIFFERENTIAL
HCT: 34 % — AB (ref 36–46)
HEMOGLOBIN: 10.6 g/dL — AB (ref 12.0–16.0)
Platelets: 115 10*3/uL — AB (ref 150–399)
WBC: 2.8 10^3/mL

## 2015-10-28 LAB — HEPATIC FUNCTION PANEL
ALT: 30 U/L (ref 7–35)
AST: 29 U/L (ref 13–35)
BILIRUBIN, TOTAL: 0.3 mg/dL
Bilirubin, Direct: 0.14 mg/dL (ref 0.01–0.4)

## 2015-10-31 IMAGING — DX DG ELBOW COMPLETE 3+V*R*
4 series · 4 of 4 positions shown · non-contrast
Comparison: 07/07/2014

CLINICAL DATA: Root elbow deformity

EXAM:
RIGHT ELBOW - COMPLETE 3+ VIEW

[elbow ap]
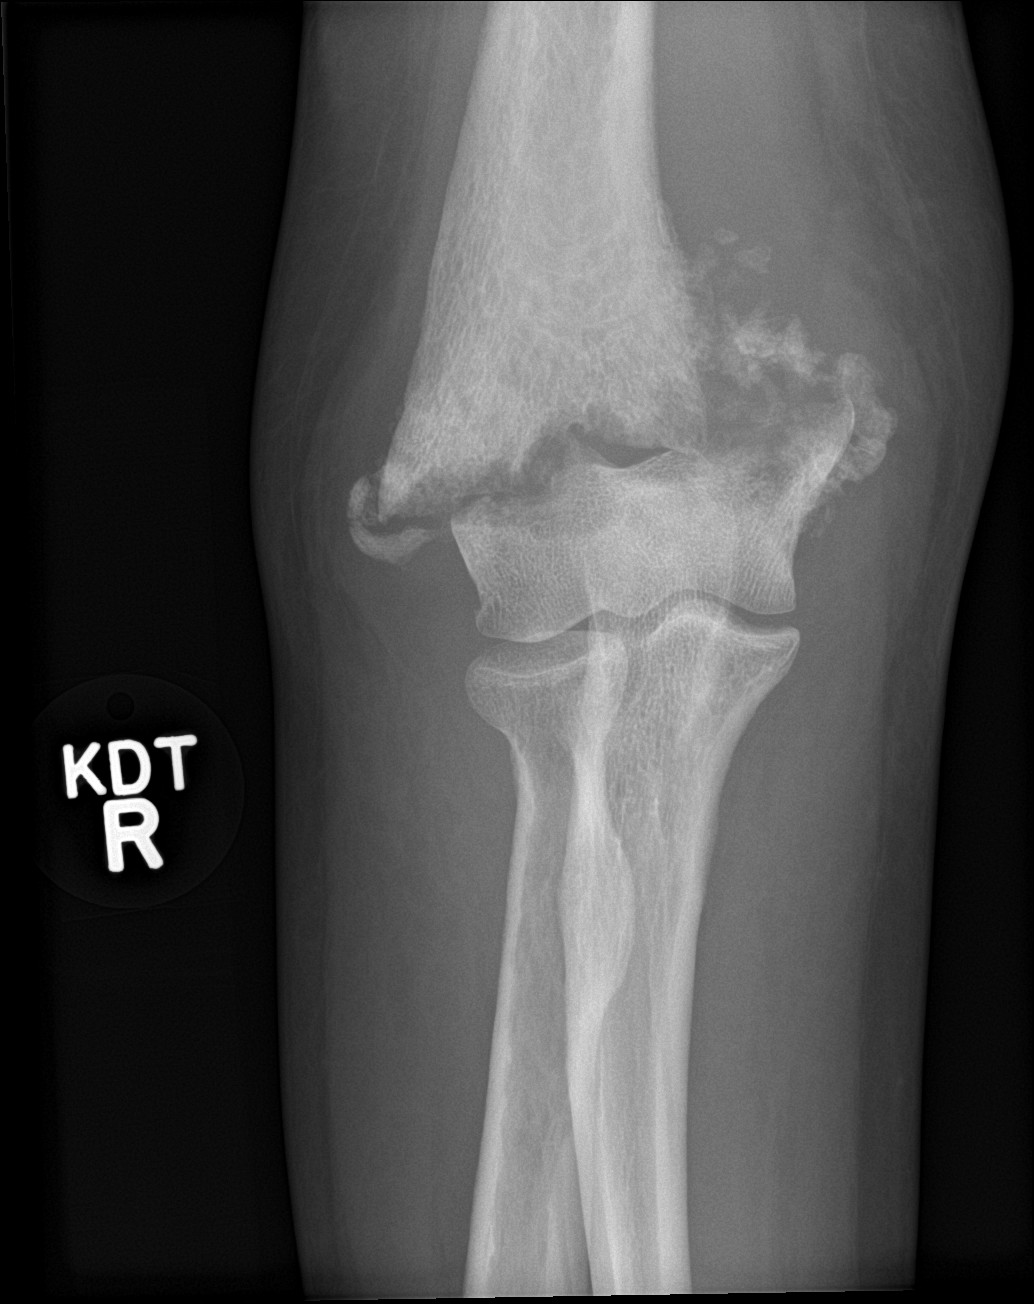

[elbow obl (1 of 2)]
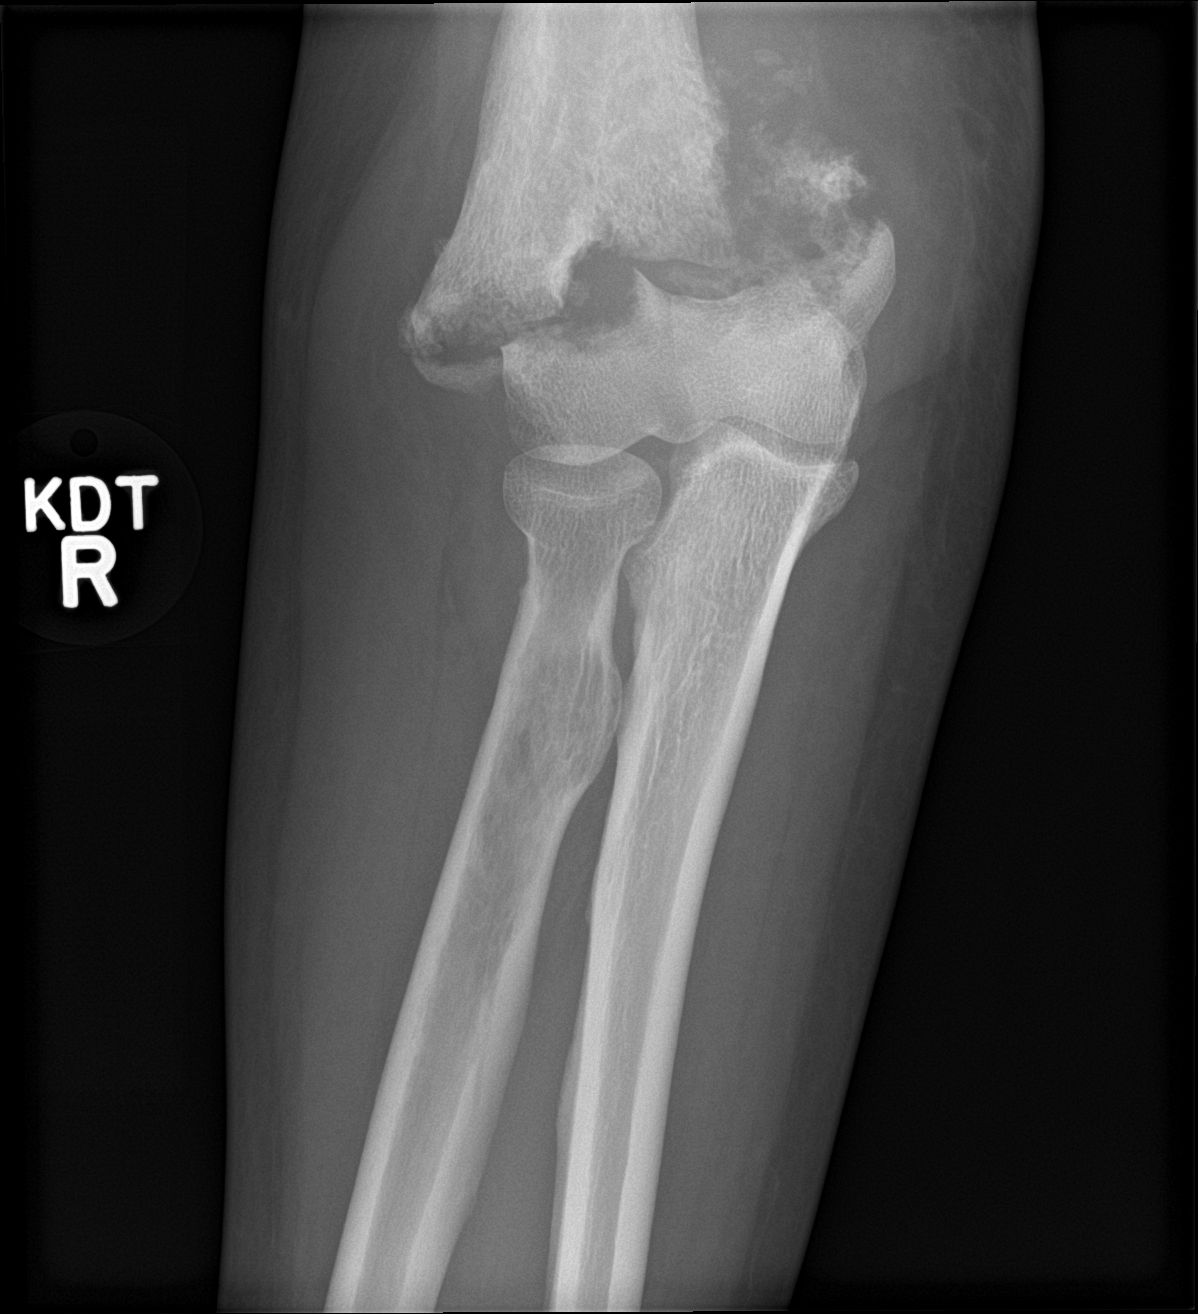

[elbow obl (2 of 2)]
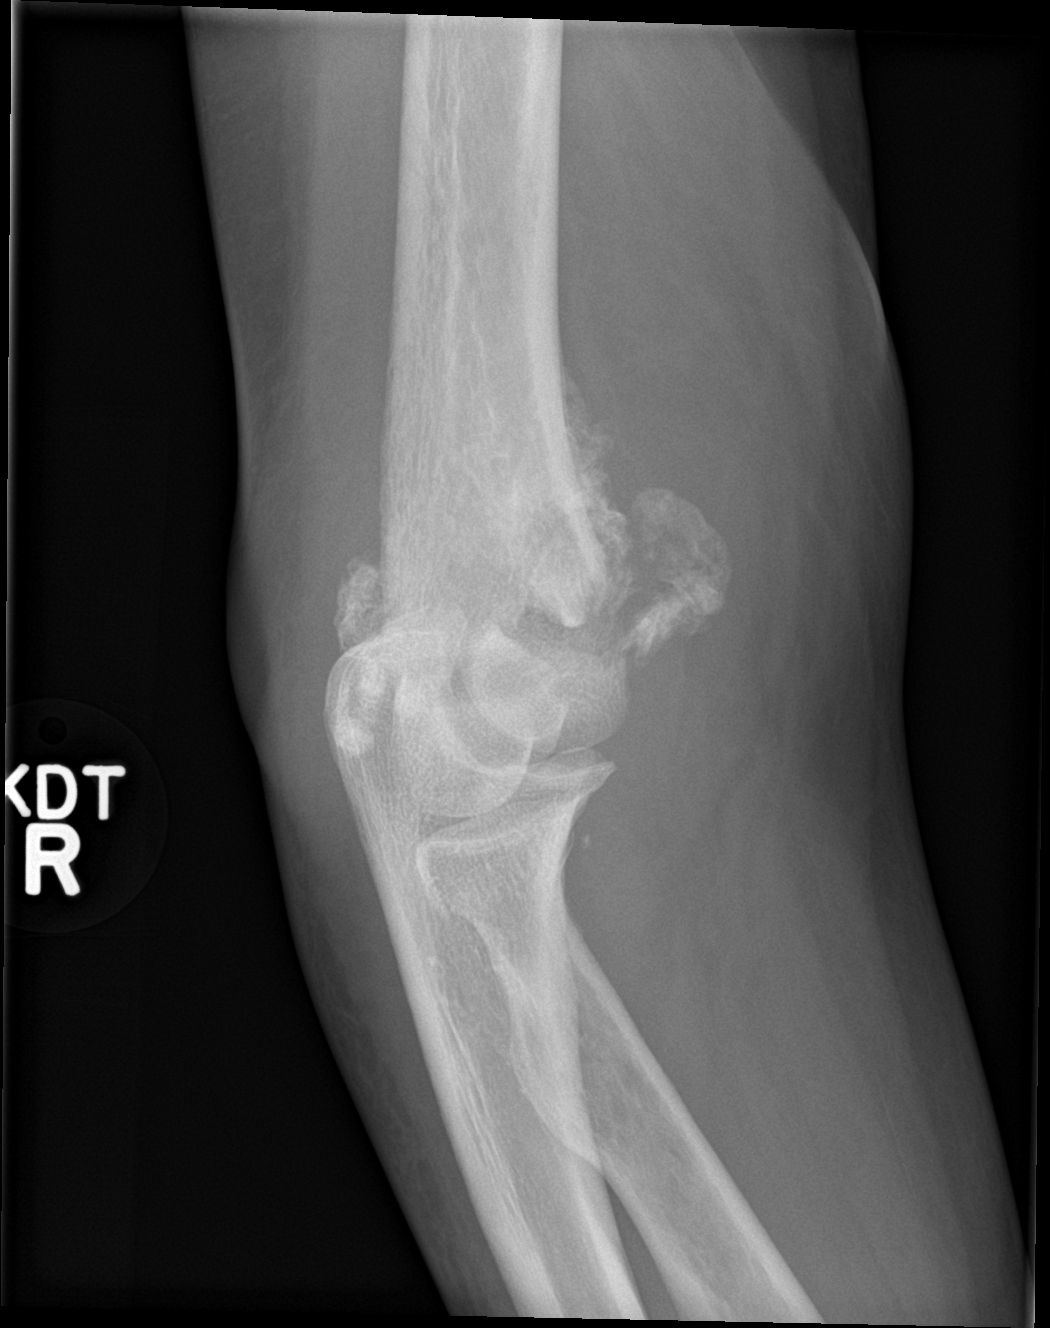

[elbow lat]
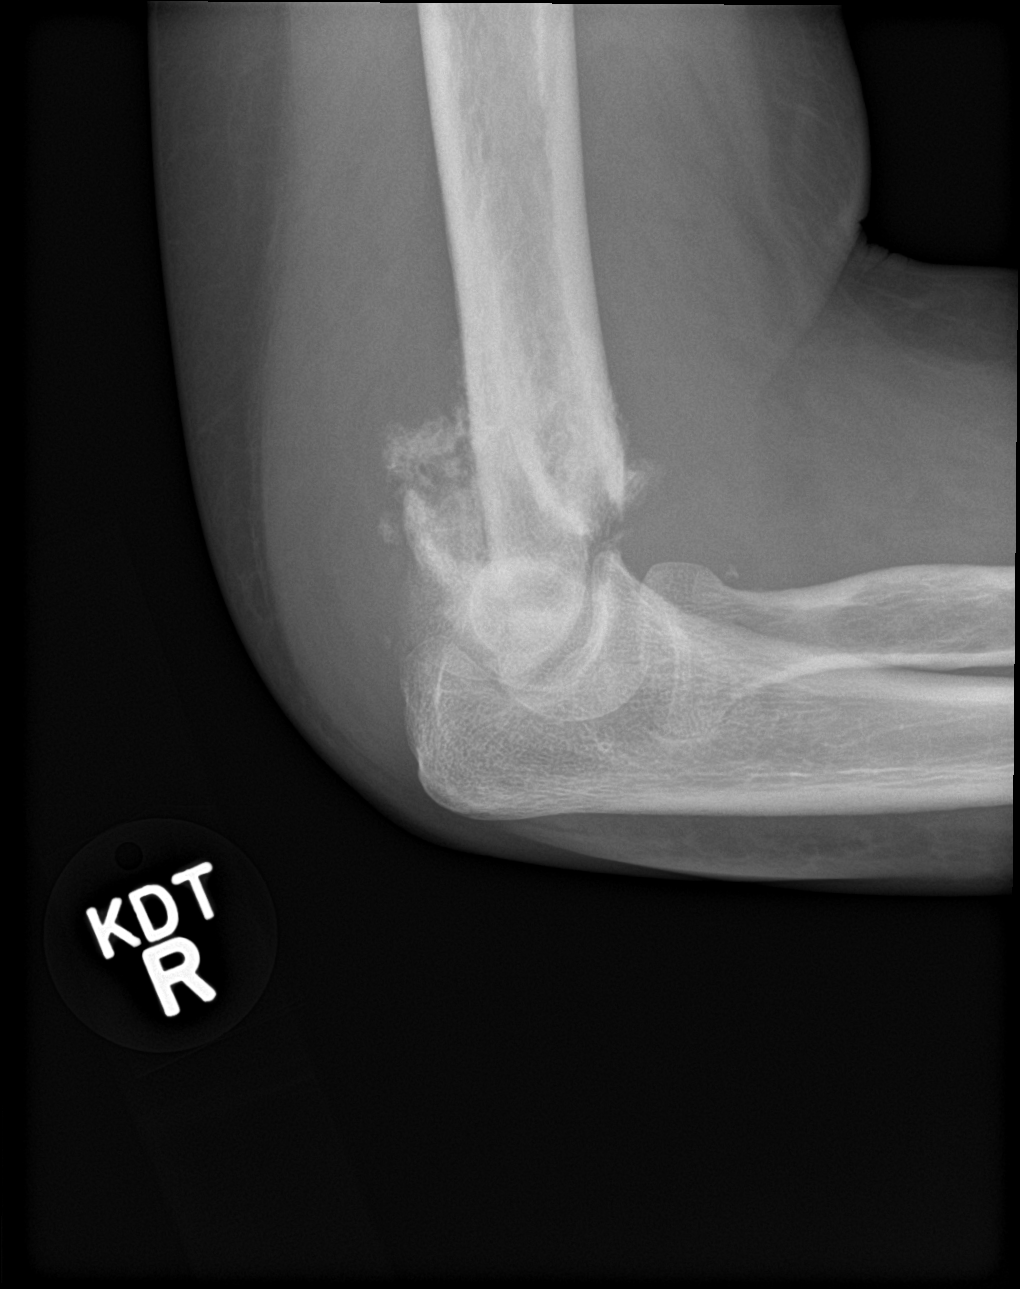

[4 of 4 positions shown; findings below may reference images not displayed]

FINDINGS: There is a wide nonunion of a supracondylar humerus fracture with
fragmentation and non bridging callus. There is unchanged lateral
displacement of the distal fragment by 1 cm. There is posterior
displacement which is also similar to prior. Swelling and elbow
joint effusion is present. These changes could all be related to
lack of immobilization, although bone infection cannot be excluded
given extensive demineralization.
IMPRESSION: Unchanged nonunion of a displaced supracondylar humerus fracture.
Given the marked demineralization, consider the possibility of
associated infection.

## 2015-11-03 ENCOUNTER — Other Ambulatory Visit: Payer: Self-pay | Admitting: Licensed Clinical Social Worker

## 2015-11-03 LAB — CBC AND DIFFERENTIAL: HEMOGLOBIN: 11.3 g/dL — AB (ref 12.0–16.0)

## 2015-11-03 LAB — BASIC METABOLIC PANEL: BUN: 76 mg/dL — AB (ref 4–21)

## 2015-11-03 NOTE — Patient Outreach (Signed)
[place] [facility]) Care Management  11/03/2015  KATREENA ACHEY 1965/10/03 [id number]   Assessment-CSW received return call from SNF social worker. Social worker states that she has no updates on whether surgery was scheduled for arm but that patient is expected to stay for up to 3-4 more months. Social worker has no further updates on discharge. Social worker states that patient's overall mood has improved since she has arrived at [place] stating "She is up and out more often than she was when she first got here. She is talking to more people and doesn't seem as down as she used to be."  Plan-CSW will complete next outreach within one week for additional updates on care plan.  Eula Fried, BSW, MSW, [email or web address] Phone: [phone] Fax: [phone]

## 2015-11-03 NOTE — Patient Outreach (Signed)
[place] [facility]) Care Management  11/03/2015  KYNLEI HARGUS [birth year] [id number]   Assessment-CSW completed outreach to SNF social worker in order to gain updates on patient and care plan. SNF social worker unable to be reached. CSW left a HIPPA compliant voice message encouraging her to return call once available.  Plan-CSW will await return call from SNF social worker or complete an additional call.  Eula Fried, BSW, MSW, [email or web address] Phone: [phone] Fax: [phone]

## 2015-11-10 ENCOUNTER — Encounter: Payer: Self-pay | Admitting: Adult Health

## 2015-11-10 ENCOUNTER — Non-Acute Institutional Stay (SKILLED_NURSING_FACILITY): Payer: Medicare Other | Admitting: Adult Health

## 2015-11-10 DIAGNOSIS — N186 End stage renal disease: Secondary | ICD-10-CM | POA: Diagnosis not present

## 2015-11-10 DIAGNOSIS — I5042 Chronic combined systolic (congestive) and diastolic (congestive) heart failure: Secondary | ICD-10-CM

## 2015-11-10 DIAGNOSIS — E1122 Type 2 diabetes mellitus with diabetic chronic kidney disease: Secondary | ICD-10-CM | POA: Diagnosis not present

## 2015-11-10 DIAGNOSIS — Z8673 Personal history of transient ischemic attack (TIA), and cerebral infarction without residual deficits: Secondary | ICD-10-CM

## 2015-11-10 DIAGNOSIS — I110 Hypertensive heart disease with heart failure: Secondary | ICD-10-CM

## 2015-11-10 DIAGNOSIS — G894 Chronic pain syndrome: Secondary | ICD-10-CM

## 2015-11-10 DIAGNOSIS — I5040 Unspecified combined systolic (congestive) and diastolic (congestive) heart failure: Secondary | ICD-10-CM | POA: Diagnosis not present

## 2015-11-10 DIAGNOSIS — G40909 Epilepsy, unspecified, not intractable, without status epilepticus: Secondary | ICD-10-CM | POA: Diagnosis not present

## 2015-11-10 DIAGNOSIS — N185 Chronic kidney disease, stage 5: Secondary | ICD-10-CM | POA: Diagnosis not present

## 2015-11-10 DIAGNOSIS — E785 Hyperlipidemia, unspecified: Secondary | ICD-10-CM

## 2015-11-10 DIAGNOSIS — Z992 Dependence on renal dialysis: Secondary | ICD-10-CM

## 2015-11-10 DIAGNOSIS — F250 Schizoaffective disorder, bipolar type: Secondary | ICD-10-CM | POA: Diagnosis not present

## 2015-11-10 DIAGNOSIS — E1169 Type 2 diabetes mellitus with other specified complication: Secondary | ICD-10-CM

## 2015-11-10 NOTE — Progress Notes (Signed)
Patient ID: [name], female   DOB: 1966/05/07, 50 y.o.   MRN: [id number]   Facility: [facility]       Allergies  Allergen Reactions  . Gabapentin Itching and Swelling  . Gabapentin Swelling  . Lyrica [Pregabalin] Swelling    Facial and leg swelling  . Other     NO BLOOD PRODUCTS. PT IS A JEHOVAH WITNESS  . Penicillins Hives    Has patient had a PCN reaction causing immediate rash, facial/tongue/throat swelling, SOB or lightheadedness with hypotension: Yes Has patient had a PCN reaction causing severe rash involving mucus membranes or skin necrosis: No Has patient had a PCN reaction that required hospitalization No Has patient had a PCN reaction occurring within the last 10 years: No If all of the above answers are "NO", then may proceed with Cephalosporin use. Pt tolerates cefepime and ceftriaxone  . Saphris [Asenapine] Swelling    Swelling of the face  . Saphris [Asenapine] Swelling    Facial swelling  . Tramadol Swelling    Causes legs to swell  . Tramadol Swelling    Leg swelling    Chief Complaint  Patient presents with  . Medical Management of Chronic Issues    HPI:  She is a resident of this facility being seen for the management of her chronic illnesses. Overall there is little change in her status. She is unable to fully participate in the hpi or ros. There are no nursing concerns at this time.    Past Medical History  Diagnosis Date  . Neuropathy ([place])   . Glaucoma   . Bipolar 1 disorder ([place])   . Refusal of blood transfusions as patient is Jehovah's Witness   . TIA (transient ischemic attack) 2010  . MRSA infection ?2014    "on the back of my head; spread throughout my bloodstream"  . Bipolar disorder, unspecified ([place]) 02/06/2014  . High cholesterol   . 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 Hepatitis C   . Chronic pain syndrome   . ESRD on hemodialysis ([place])   . Schizoaffective disorder, unspecified condition  02/08/2014  . Seizure [facility]) dx'd 2014    "don't know what kind; last one was ~ 04/2014"  . Chronic combined systolic and diastolic CHF (congestive heart failure) (HCC)     EF 40% by echo and 48% by stress test  . Anemia   . Hypertension   . Depression   . Anxiety   . GERD (gastroesophageal reflux disease)   . History of hiatal hernia   . Headache     sinus headaches  . History of vitamin D deficiency 06/20/2015  . Stroke [facility])     TIA 2009    Past Surgical History  Procedure Laterality Date  . Amputation Left 05/21/2013    Procedure: Left Midfoot AMPUTATION;  Surgeon: Newt Minion, MD;  Location: [place];  Service: Orthopedics;  Laterality: Left;  Left Midfoot Amputation  . Tubal ligation    . Colonoscopy with propofol N/A 06/22/2013    Procedure: COLONOSCOPY WITH PROPOFOL;  Surgeon: Jeryl Columbia, MD;  Location: [facility]COPY;  Service: Endoscopy;  Laterality: N/A;  . Abdominal hysterectomy  2014  . Tonsillectomy  1970's  . Refractive surgery Bilateral 2013  . Tubal ligation  2000  . Abdominal hysterectomy  2010  . Foot amputation Left     due diabetic neuropathy, could not feel ulcer on bottom of foot  .  Eye surgery Bilateral 2010    Lasik  . Av fistula placement Left 05/04/2015    Procedure: ARTERIOVENOUS (AV) GRAFT INSERTION;  Surgeon: Angelia Mould, MD;  Location: [place];  Service: Vascular;  Laterality: Left;  . Esophagogastroduodenoscopy N/A 05/11/2015    Procedure: ESOPHAGOGASTRODUODENOSCOPY (EGD);  Surgeon: Laurence Spates, MD;  Location: [facility] ENDOSCOPY;  Service: Endoscopy;  Laterality: N/A;    VITAL SIGNS BP 130/72 mmHg  Pulse 88  Temp(Src) 98 F (36.7 C) (Oral)  Resp 20  Ht 5\' 4"  (1.626 m)  Wt 159 lb 5 oz (72.264 kg)  BMI 27.33 kg/m2  LMP  (LMP Unknown)  Patient's Medications  New Prescriptions   No medications on file  Previous Medications   ACETAMINOPHEN (TYLENOL) 325 MG TABLET    Take 2 tablets (650 mg total) by mouth every 6 (six) hours as needed for  mild pain (or Fever >/= 101).   AMLODIPINE (NORVASC) 10 MG TABLET    Take 1 tablet (10 mg total) by mouth at bedtime.   ATORVASTATIN (LIPITOR) 40 MG TABLET    Take 1 tablet (40 mg total) by mouth at bedtime.   COENZYME Q10 (COQ10) 100 MG CAPS    Take 1 capsule by mouth daily.   CYANOCOBALAMIN (B-12) 2500 MCG TABS    Take 1 capsule by mouth daily.   DIPHENHYDRAMINE (BENADRYL) 25 MG CAPSULE    Take 1 capsule (25 mg total) by mouth every 6 (six) hours as needed for itching.   DOCUSATE SODIUM (COLACE) 100 MG CAPSULE    Take 100 mg by mouth 2 (two) times daily.   INSULIN ASPART (NOVOLOG) 100 UNIT/ML INJECTION    Inject 0-9 Units into the skin 3 (three) times daily with meals. Per sliding scale   INSULIN GLARGINE (LANTUS) 100 UNIT/ML INJECTION    Inject 0.05 mLs (5 Units total) into the skin at bedtime.   LEVETIRACETAM (KEPPRA) 500 MG TABLET    Take 500 mg by mouth every 12 (twelve) hours.   LORAZEPAM (ATIVAN) 0.5 MG TABLET    Take 0.25 mg by mouth every 8 (eight) hours as needed for anxiety.   MELATONIN 3 MG TABS    Take 1 tablet by mouth at bedtime.   MULTIPLE VITAMIN (MULTIVITAMIN WITH MINERALS) TABS TABLET    Take 1 tablet by mouth daily.   NIACIN 500 MG TABLET    Take 500 mg by mouth at bedtime.   OLANZAPINE (ZYPREXA) 5 MG TABLET    Take 5 mg by mouth at bedtime.   OXYCODONE HCL 10 MG TABS    Take 1 tablet (10 mg total) by mouth every 6 (six) hours as needed.   PROMETHAZINE (PHENERGAN) 12.5 MG TABLET    Take 1 tablet (12.5 mg total) by mouth every 6 (six) hours as needed for nausea.   SEVELAMER CARBONATE (RENVELA) 800 MG TABLET    Take 800 mg by mouth 3 (three) times daily with meals.   SORBITOL PO    Take 30 mLs by mouth every 2 (two) hours as needed (for constipation.).  Modified Medications   No medications on file  Discontinued Medications   No medications on file     SIGNIFICANT DIAGNOSTIC EXAMS  10-25-15: Kub: nonobstructive bowel gas pattern. Mild degree of constipation. No  significant abdominal pathology     LABS REVIEWED;   09-17-15: wbc 4.1; hgb 5.5; hct 18.0; plt 95.2 ;plt 257; glucose 80; bun 85; creat 13.19; k+ 6.6; na++140; liver normal albumin 2.9 drug screen +  cocaine 09-28-15: vit B12; 1664; folate >24.0; vit D 29; tsh 2.30     Review of Systems  Unable to perform ROS: psychiatric disorder     Physical Exam  Constitutional: No distress.  Frail   Eyes: Conjunctivae are normal.  Neck: Neck supple. No JVD present. No thyromegaly present.  Cardiovascular: Normal rate, regular rhythm and intact distal pulses.   Respiratory: Effort normal and breath sounds normal. No respiratory distress. She has no wheezes.  GI: Soft. Bowel sounds are normal. She exhibits no distension. There is no tenderness.  Musculoskeletal: She exhibits no edema.  Able to move all extremities   Lymphadenopathy:    She has no cervical adenopathy.  Neurological: She is alert.  Skin: Skin is warm and dry. She is not diaphoretic.  Left arm A/V shunt +thrill +bruit   Psychiatric: She has a normal mood and affect.      ASSESSMENT/ PLAN:  1. ESRD: hemodialysis 3 days per week; will continue renvela 800 mg three times daily   2. Hypertension: will continue norvasc 10 mg daily   3. Dyslipidemia: will continue lipitor 40 mg daily will continue niacin 500 mg daily   4. Constipation: will continue colace twice daily and sorbitol 30 cc as needed   5. Diabetes: will continue lantus 5 units nightly will continue  novolog SSI  6. Seizures: no reports of recent seizure activity: will continue keppra 500 mg twice daily   7. Schizophrenia: will continue zyprexa 5 mg nightly has ativan 0.25 mg every 8 hours as needed for anxiety is taking melatonin 3 mg nightly for sleep   8. CVA: is neurologically stable is presently not on medications; will monitor  9. Combine diastolic and systolic heart failure: is presently not on medications will continue to monitor her status. EF is  40%  10. Chronic pain syndrome: will continue oxycodone 10 mg every 6 hours as needed   Time spent with patient  45  minutes >50% time spent counseling; reviewing medical record; tests; labs; and developing future plan of care     Ok Edwards NP [facility]  Contact [phone] Monday through Friday 8am- 5pm  After hours call [phone]

## 2015-11-16 ENCOUNTER — Encounter: Payer: Self-pay | Admitting: Licensed Clinical Social Worker

## 2015-11-16 ENCOUNTER — Other Ambulatory Visit: Payer: Self-pay | Admitting: Licensed Clinical Social Worker

## 2015-11-16 NOTE — Patient Outreach (Signed)
[place] [facility]) Care Management  11/16/2015  [name] [birth year] [id number]   Assessment-CSW completed outreach to SNF d/c planner. CSW unable to reach her but left a HIPPA compliant voice message requesting updates on patient.  Plan-CSW will await for return call from SNF d/c planner in order to gain discharge planning updates.  Eula Fried, BSW, MSW, [email or web address] Phone: [phone] Fax: [phone]

## 2015-11-16 NOTE — Patient Outreach (Addendum)
[place] [facility]) Care Management  11/16/2015  GENIENE ESCOBEDO Dec 28, 1965 [id number]   Assessment-CSW received return call from [facility] discharge planner at [facility]. Tillie Rung shares that there are no updates for patient. Patient will still reside at facility until she has surgery on arm but no plans for surgery have been made and patient has not seen physician yet to schedule surgery. Discussion on possible long term care. Patient has been at [facility] since 09/20/15. CSW will complete case closure at this time.   Plan-CSW will notify PCP and [facility] Management assistant.  Eula Fried, BSW, MSW, [email or web address] Phone: [phone] Fax: [phone]

## 2015-11-17 LAB — BASIC METABOLIC PANEL: BUN: 65 mg/dL — AB (ref 4–21)

## 2015-11-17 LAB — CBC AND DIFFERENTIAL: HEMOGLOBIN: 12.4 g/dL (ref 12.0–16.0)

## 2015-11-18 IMAGING — DX DG CHEST 2V
2 series · 2 of 2 positions shown · non-contrast
Comparison: none

[chest pa]
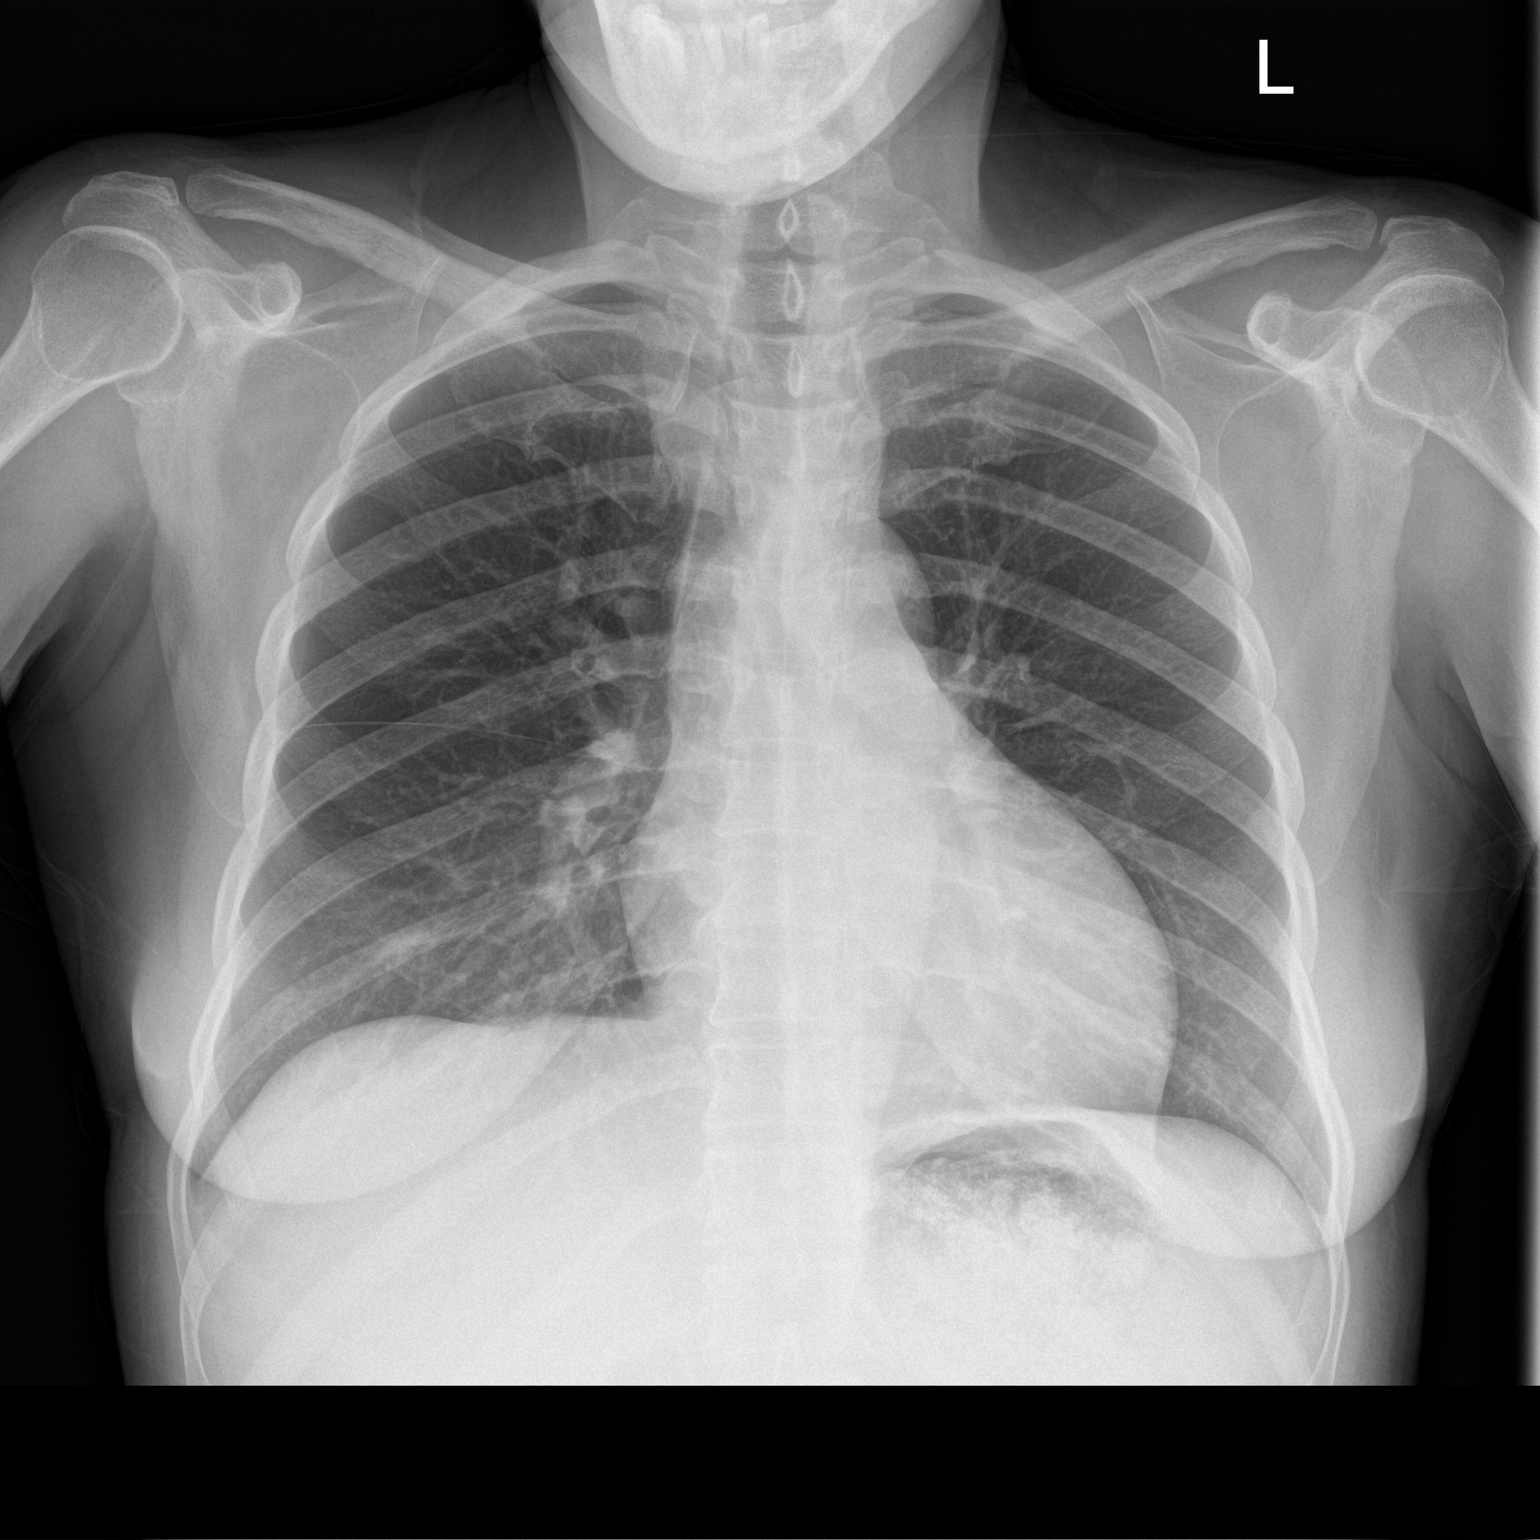

[chest lat]
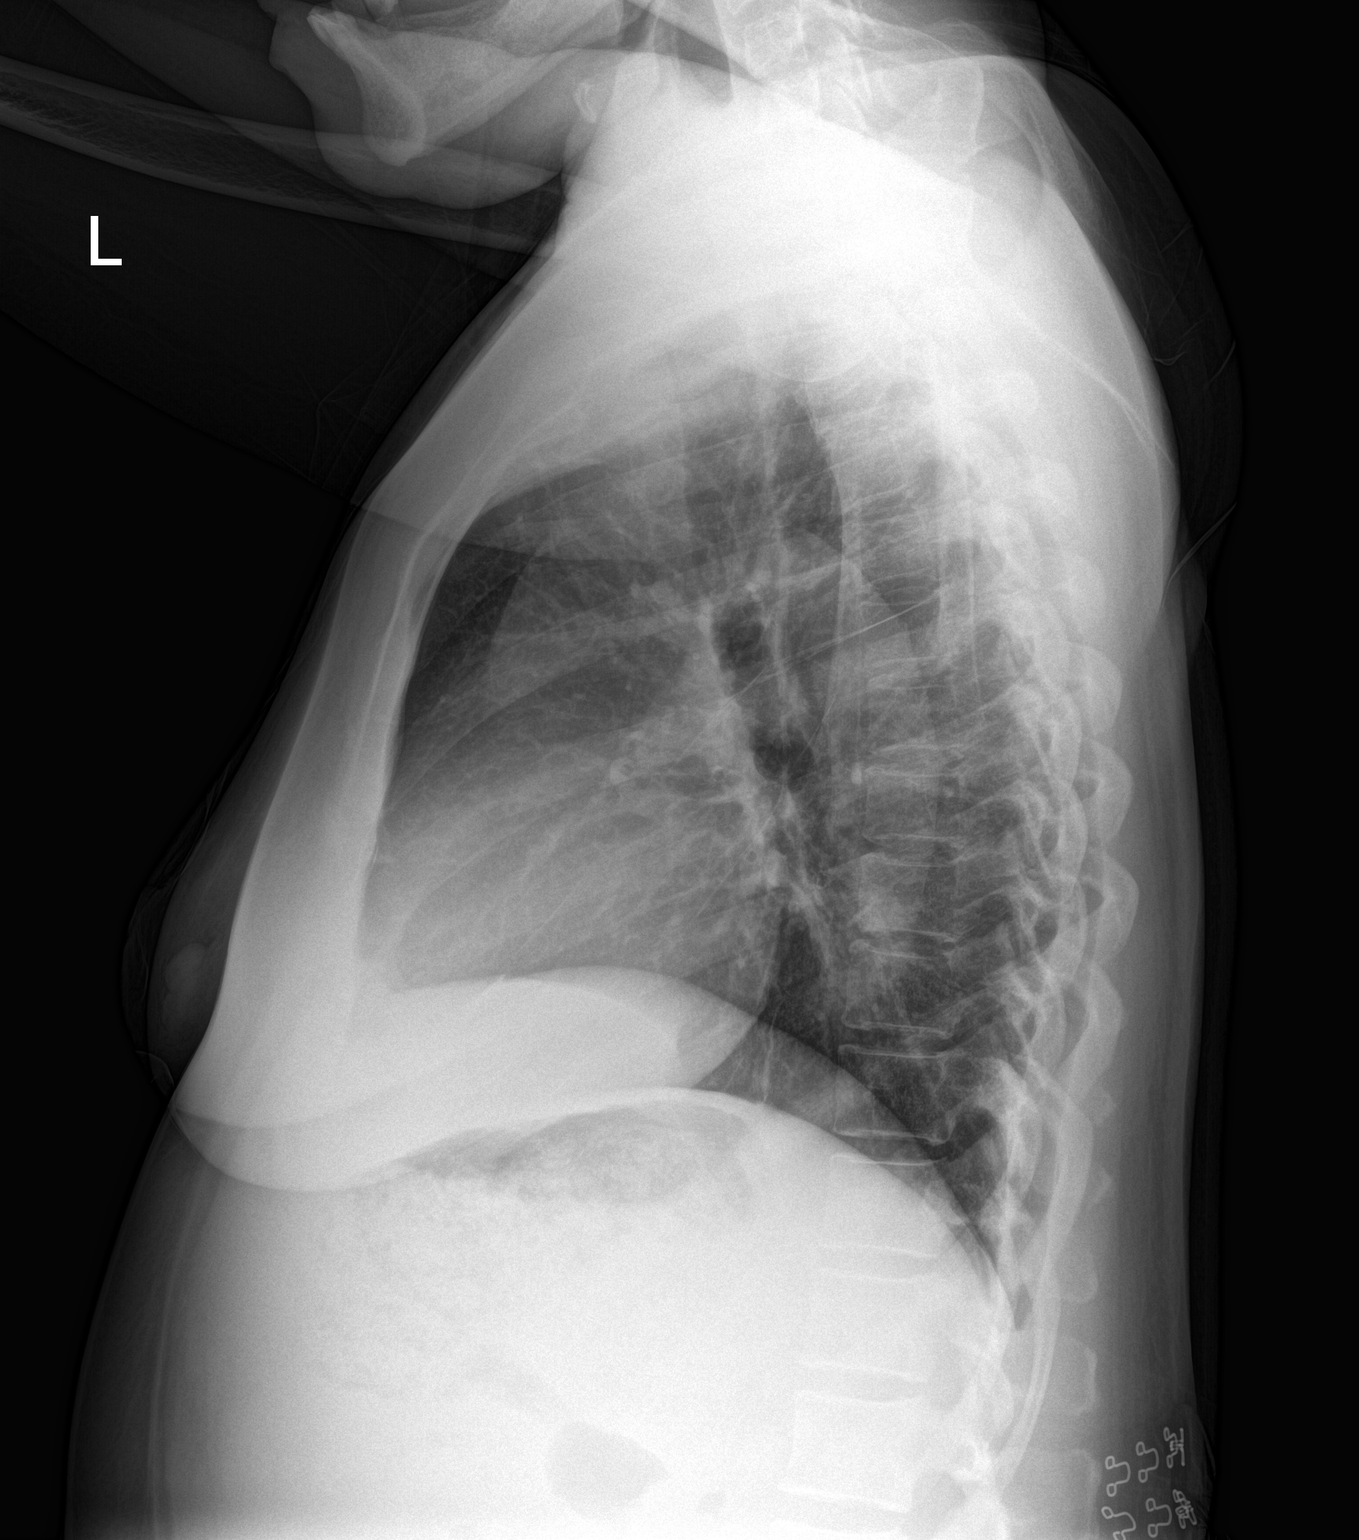

[2 of 2 positions shown; findings below may reference images not displayed]

Canned report from images found in remote index.

Refer to host system for actual result text.

## 2015-11-20 ENCOUNTER — Encounter: Payer: Self-pay | Admitting: Internal Medicine

## 2015-11-20 ENCOUNTER — Non-Acute Institutional Stay (SKILLED_NURSING_FACILITY): Payer: Medicare Other | Admitting: Internal Medicine

## 2015-11-20 DIAGNOSIS — F250 Schizoaffective disorder, bipolar type: Secondary | ICD-10-CM

## 2015-11-20 DIAGNOSIS — F329 Major depressive disorder, single episode, unspecified: Secondary | ICD-10-CM | POA: Diagnosis not present

## 2015-11-20 DIAGNOSIS — I5040 Unspecified combined systolic (congestive) and diastolic (congestive) heart failure: Secondary | ICD-10-CM | POA: Diagnosis not present

## 2015-11-20 DIAGNOSIS — Z992 Dependence on renal dialysis: Secondary | ICD-10-CM

## 2015-11-20 DIAGNOSIS — F32A Depression, unspecified: Secondary | ICD-10-CM

## 2015-11-20 DIAGNOSIS — I110 Hypertensive heart disease with heart failure: Secondary | ICD-10-CM | POA: Diagnosis not present

## 2015-11-20 DIAGNOSIS — N186 End stage renal disease: Secondary | ICD-10-CM

## 2015-11-20 NOTE — Progress Notes (Signed)
Patient ID: [name], female   DOB: [birth year], 50 y.o.   MRN: [id number]    DATE: 11/20/15  Location:  [facility] Room Number: [id number] A Place of Service: SNF (31)   Extended Emergency Contact Information Primary Emergency Contact: Clent Demark, [place] [address] [place] Phone: [phone] Relation: Aunt Secondary Emergency Contact: Candis Schatz [place] Phone: [phone] Mobile Phone: [phone] Relation: Son  Advanced Directive information Does patient have an advance directive?: No, Does patient want to make changes to advanced directive?: No - Patient declined FULL CODE Chief Complaint  Patient presents with  . Acute Visit    Depression    HPI:  50 yo female with hx schizoaffective d/o, bipolar type seen today for depression. She c/o increased depression since beginning zyprexa 5mg  qhs. She takes prn ativan which helps anxiety. She noticed vivid dreams, increased crying, feeling sad and anhedonia. She has trouble sleeping at night. No relief with melatonin. Denies SI/HI. She still has auditory hallucinations. psych services is following her. She missed HD appt today as she was too sad to go. explained to pt the importance of keeping her HD appts to prevent exacerbation of CHF.    She is a poor historian due to psych d/o. Hx obtained from chart and nursing.  Past Medical History  Diagnosis Date  . Neuropathy ([place])   . Glaucoma   . Bipolar 1 disorder ([place])   . Refusal of blood transfusions as patient is Jehovah's Witness   . TIA (transient ischemic attack) 2010  . MRSA infection ?2014    "on the back of my head; spread throughout my bloodstream"  . Bipolar disorder, unspecified ([place]) 02/06/2014  . High cholesterol   . 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 Hepatitis C   . Chronic pain syndrome   . ESRD on hemodialysis ([place])   . Schizoaffective disorder,  unspecified condition 02/08/2014  . Seizure [facility]) dx'd 2014    "don't know what kind; last one was ~ 04/2014"  . Chronic combined systolic and diastolic CHF (congestive heart failure) (HCC)     EF 40% by echo and 48% by stress test  . Anemia   . Hypertension   . Depression   . Anxiety   . GERD (gastroesophageal reflux disease)   . History of hiatal hernia   . Headache     sinus headaches  . History of vitamin D deficiency 06/20/2015  . Stroke [facility])     TIA 2009    Past Surgical History  Procedure Laterality Date  . Amputation Left 05/21/2013    Procedure: Left Midfoot AMPUTATION;  Surgeon: Newt Minion, MD;  Location: [facility];  Service: Orthopedics;  Laterality: Left;  Left Midfoot Amputation  . Tubal ligation    . Colonoscopy with propofol N/A 06/22/2013    Procedure: COLONOSCOPY WITH PROPOFOL;  Surgeon: Jeryl Columbia, MD;  Location: [facility] ENDOSCOPY;  Service: Endoscopy;  Laterality: N/A;  . Abdominal hysterectomy  2014  . Tonsillectomy  1970's  . Refractive surgery Bilateral 2013  . Tubal ligation  2000  . Abdominal hysterectomy  2010  . Foot amputation Left     due diabetic neuropathy, could not feel ulcer on bottom of foot  . Eye surgery Bilateral 2010    Lasik  . Av fistula placement Left 05/04/2015    Procedure: ARTERIOVENOUS (AV)  GRAFT INSERTION;  Surgeon: Angelia Mould, MD;  Location: [facility] OR;  Service: Vascular;  Laterality: Left;  . Esophagogastroduodenoscopy N/A 05/11/2015    Procedure: ESOPHAGOGASTRODUODENOSCOPY (EGD);  Surgeon: Laurence Spates, MD;  Location: [facility] ENDOSCOPY;  Service: Endoscopy;  Laterality: N/A;    Patient Care Team: Maurice Small, MD as PCP - General (Family Medicine)  Social History   Social History  . Marital Status: Unknown    Spouse Name: N/A  . Number of Children: N/A  . Years of Education: N/A   Occupational History  . Not on file.   Social History Main Topics  . Smoking status: Current Some Day Smoker -- 1.00 packs/day for 20 years     Types: Cigarettes    Last Attempt to Quit: 03/31/2015  . Smokeless tobacco: Never Used     Comment: "stopped smoking in ~ 2013; restarted last week; stopped again 06/17/2014"  . Alcohol Use: No     Comment: 05/01/15 patient denies alcohol use.  . Drug Use: Yes    Special: "Crack" cocaine, Cocaine     Comment: last smoked crack 11-07-14  . Sexual Activity: Not Currently    Birth Control/ Protection: Abstinence   Other Topics Concern  . Not on file   Social History Narrative   ** Merged History Encounter **         reports that she has been smoking Cigarettes.  She has a 20 pack-year smoking history. She has never used smokeless tobacco. She reports that she uses illicit drugs ("Crack" cocaine and Cocaine). She reports that she does not drink alcohol.  Immunization History  Administered Date(s) Administered  . Influenza,inj,Quad PF,36+ Mos 05/19/2013, 04/23/2014, 05/05/2015  . PPD Test 09/20/2015  . Pneumococcal Polysaccharide-23 02/09/2013, 06/03/2013  . Tdap 05/19/2013    Allergies  Allergen Reactions  . Gabapentin Itching and Swelling  . Gabapentin Swelling  . Lyrica [Pregabalin] Swelling    Facial and leg swelling  . Other     NO BLOOD PRODUCTS. PT IS A JEHOVAH WITNESS  . Penicillins Hives    Has patient had a PCN reaction causing immediate rash, facial/tongue/throat swelling, SOB or lightheadedness with hypotension: Yes Has patient had a PCN reaction causing severe rash involving mucus membranes or skin necrosis: No Has patient had a PCN reaction that required hospitalization No Has patient had a PCN reaction occurring within the last 10 years: No If all of the above answers are "NO", then may proceed with Cephalosporin use. Pt tolerates cefepime and ceftriaxone  . Saphris [Asenapine] Swelling    Swelling of the face  . Saphris [Asenapine] Swelling    Facial swelling  . Tramadol Swelling    Causes legs to swell  . Tramadol Swelling    Leg swelling     Medications: Patient's Medications  New Prescriptions   No medications on file  Previous Medications   ACETAMINOPHEN (TYLENOL) 325 MG TABLET    Take 2 tablets (650 mg total) by mouth every 6 (six) hours as needed for mild pain (or Fever >/= 101).   AMLODIPINE (NORVASC) 10 MG TABLET    Take 1 tablet (10 mg total) by mouth at bedtime.   ATORVASTATIN (LIPITOR) 40 MG TABLET    Take 1 tablet (40 mg total) by mouth at bedtime.   CARVEDILOL (COREG) 3.125 MG TABLET    Take 1 tablet by mouth twice daily as needed for blood pressure > 150/80. Hold for HR< 60.   COENZYME Q10 (COQ10) 100 MG CAPS  Take 1 capsule by mouth daily.   CYANOCOBALAMIN (B-12) 2500 MCG TABS    Take 1 capsule by mouth daily.   DIPHENHYDRAMINE (BENADRYL) 25 MG CAPSULE    Take 1 capsule (25 mg total) by mouth every 6 (six) hours as needed for itching.   DOCUSATE SODIUM (COLACE) 100 MG CAPSULE    Take 100 mg by mouth 2 (two) times daily.   INSULIN ASPART (NOVOLOG) 100 UNIT/ML INJECTION    Inject 0-9 Units into the skin 3 (three) times daily with meals. Per sliding scale   INSULIN GLARGINE (LANTUS) 100 UNIT/ML INJECTION    Inject 0.05 mLs (5 Units total) into the skin at bedtime.   LEVETIRACETAM (KEPPRA) 500 MG TABLET    Take 500 mg by mouth every 12 (twelve) hours.   LORAZEPAM (ATIVAN) 0.5 MG TABLET    Take 0.25 mg by mouth every 8 (eight) hours as needed for anxiety.   MELATONIN 5 MG TABS    Take 1 tablet by mouth at bedtime.   MULTIPLE VITAMIN (MULTIVITAMIN WITH MINERALS) TABS TABLET    Take 1 tablet by mouth daily.   NIACIN 500 MG TABLET    Take 500 mg by mouth at bedtime.   OLANZAPINE (ZYPREXA) 5 MG TABLET    Take 5 mg by mouth at bedtime.   OXYCODONE HCL 10 MG TABS    Take 1 tablet (10 mg total) by mouth every 6 (six) hours as needed.   POLYETHYLENE GLYCOL (MIRALAX / GLYCOLAX) PACKET    Mix 1 capful (17 GM) in 8 oz of water and drink by mouth twice daily for constipation.   POLYVINYL ALCOHOL (LIQUIFILM TEARS) 1.4 %  OPHTHALMIC SOLUTION    Place 1 drop into both eyes every 6 (six) hours as needed for dry eyes. Wait 3-5 minutes between different drops. **May keep at bedside for self administration.   PROMETHAZINE (PHENERGAN) 12.5 MG TABLET    Take 1 tablet (12.5 mg total) by mouth every 6 (six) hours as needed for nausea.   SEVELAMER CARBONATE (RENVELA) 800 MG TABLET    Take 800 mg by mouth 3 (three) times daily with meals.   SORBITOL PO    Take 30 mLs by mouth every 2 (two) hours as needed (for constipation.).  Modified Medications   No medications on file  Discontinued Medications   MELATONIN 3 MG TABS    Take 1 tablet by mouth at bedtime.    Review of Systems  Unable to perform ROS: Psychiatric disorder    Filed Vitals:   11/20/15 1359  BP: 170/100  Pulse: 90  Temp: 96.5 F (35.8 C)  TempSrc: Oral  Resp: 18  Height: 5\' 4"  (1.626 m)  Weight: 157 lb 8 oz (71.442 kg)   Body mass index is 27.02 kg/(m^2).  Physical Exam  Constitutional: She appears well-developed.  Looks anxious in NAD, flat affect. Frail appearing sitting on edge of bed  Cardiovascular: Normal rate, regular rhythm and intact distal pulses.  Exam reveals gallop (s4).   Murmur (1/6 SEM) heard. +1 pitting LE edema b/l. No calf TTP  Pulmonary/Chest: Effort normal and breath sounds normal. No respiratory distress. She has no wheezes. She has no rales. She exhibits no tenderness.  Neurological: She is alert.  Skin: Skin is warm and dry. No rash noted.  Psychiatric: Her speech is normal. She is not agitated and not actively hallucinating. She exhibits a depressed mood. She expresses no homicidal and no suicidal ideation. She expresses no suicidal plans and  no homicidal plans.     Labs reviewed: Nursing Home on 11/10/2015  Component Date Value Ref Range Status  . TSH 09/28/2015 2.30  .41 - 5.90 uIU/mL Final  Abstract on 11/01/2015  Component Date Value Ref Range Status  . HM Colonoscopy 06/22/2013 Dr. Watt Climes: no GI bleeds    Final  . TSH 09/28/2012 2.30  0.41 - 5.90 uIU/mL Final  Admission on 09/17/2015, Discharged on 09/20/2015  No results displayed because visit has over 200 results.  CBC Latest Ref Rng 09/19/2015 09/18/2015 09/18/2015  WBC 4.0 - 10.5 K/uL 3.8(L) 3.6(L) 3.3(L)  Hemoglobin 12.0 - 15.0 g/dL 9.6(L) 9.0(L) 8.7(L)  Hematocrit 36.0 - 46.0 % 29.8(L) 28.8(L) 27.1(L)  Platelets 150 - 400 K/uL 177 195 168    CMP Latest Ref Rng 09/19/2015 09/18/2015 09/17/2015  Glucose 65 - 99 mg/dL 233(H) 308(H) 98  BUN 6 - 20 mg/dL 46(H) 41(H) 24(H)  Creatinine 0.44 - 1.00 mg/dL 9.44(H) 8.46(H) 3.90(H)  Sodium 135 - 145 mmol/L 136 135 142  Potassium 3.5 - 5.1 mmol/L 4.9 4.3 3.2(L)  Chloride 101 - 111 mmol/L 99(L) 99(L) 102  CO2 22 - 32 mmol/L 22 24 -  Calcium 8.9 - 10.3 mg/dL 8.0(L) 7.8(L) -  Total Protein 6.5 - 8.1 g/dL - - -  Total Bilirubin 0.3 - 1.2 mg/dL - - -  Alkaline Phos 38 - 126 U/L - - -  AST 15 - 41 U/L - - -  ALT 14 - 54 U/L - - -    Lab Results  Component Value Date   HGBA1C 10.3* 05/05/2015       No results found.   Assessment/Plan   ICD-9-CM ICD-10-CM   1. Schizoaffective disorder, bipolar type ([place]) - failing to change as expected 295.70 F25.0   2. Depression -uncontrolled 311 F32.9   3. ESRD (end stage renal disease) on dialysis ([place]) -qMWF 585.6 N18.6    V45.11 Z99.2   4. 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 Will increase zyprexa 10mg  po qhs  Psych to follow  Cont other meds as ordered  F/u with HD as scheduled MWF. She missed today's appt  Will follow  Lilit Cinelli S. Perlie Gold  [facility] [address] [place], [place] [address] [phone] Cell (Monday-Friday 8 AM - 5 PM) [phone] After 5 PM and follow prompts

## 2015-11-22 ENCOUNTER — Emergency Department (HOSPITAL_COMMUNITY): Payer: Medicare Other

## 2015-11-22 ENCOUNTER — Encounter (HOSPITAL_COMMUNITY): Payer: Self-pay | Admitting: Neurology

## 2015-11-22 ENCOUNTER — Other Ambulatory Visit: Payer: Self-pay | Admitting: *Deleted

## 2015-11-22 ENCOUNTER — Non-Acute Institutional Stay (HOSPITAL_COMMUNITY)
Admission: EM | Admit: 2015-11-22 | Discharge: 2015-11-22 | Disposition: A | Discharge status: Skilled Nursing Facility | Payer: Medicare Other | Attending: Emergency Medicine | Admitting: Emergency Medicine

## 2015-11-22 DIAGNOSIS — G8929 Other chronic pain: Secondary | ICD-10-CM

## 2015-11-22 DIAGNOSIS — Z8673 Personal history of transient ischemic attack (TIA), and cerebral infarction without residual deficits: Secondary | ICD-10-CM | POA: Diagnosis not present

## 2015-11-22 DIAGNOSIS — E875 Hyperkalemia: Secondary | ICD-10-CM | POA: Diagnosis not present

## 2015-11-22 DIAGNOSIS — Z794 Long term (current) use of insulin: Secondary | ICD-10-CM | POA: Insufficient documentation

## 2015-11-22 DIAGNOSIS — E1140 Type 2 diabetes mellitus with diabetic neuropathy, unspecified: Secondary | ICD-10-CM | POA: Diagnosis not present

## 2015-11-22 DIAGNOSIS — I132 Hypertensive heart and chronic kidney disease with heart failure and with stage 5 chronic kidney disease, or end stage renal disease: Secondary | ICD-10-CM | POA: Diagnosis present

## 2015-11-22 DIAGNOSIS — I5043 Acute on chronic combined systolic (congestive) and diastolic (congestive) heart failure: Secondary | ICD-10-CM | POA: Diagnosis not present

## 2015-11-22 DIAGNOSIS — Z880 Allergy status to penicillin: Secondary | ICD-10-CM | POA: Insufficient documentation

## 2015-11-22 DIAGNOSIS — Z9115 Patient's noncompliance with renal dialysis: Secondary | ICD-10-CM | POA: Diagnosis not present

## 2015-11-22 DIAGNOSIS — Z8614 Personal history of Methicillin resistant Staphylococcus aureus infection: Secondary | ICD-10-CM | POA: Diagnosis not present

## 2015-11-22 DIAGNOSIS — Z89432 Acquired absence of left foot: Secondary | ICD-10-CM | POA: Insufficient documentation

## 2015-11-22 DIAGNOSIS — Z992 Dependence on renal dialysis: Secondary | ICD-10-CM | POA: Insufficient documentation

## 2015-11-22 DIAGNOSIS — Z87891 Personal history of nicotine dependence: Secondary | ICD-10-CM | POA: Insufficient documentation

## 2015-11-22 DIAGNOSIS — E8779 Other fluid overload: Secondary | ICD-10-CM

## 2015-11-22 LAB — COMPREHENSIVE METABOLIC PANEL
ALBUMIN: 3.3 g/dL — AB (ref 3.5–5.0)
ALK PHOS: 175 U/L — AB (ref 38–126)
ALT: 32 U/L (ref 14–54)
AST: 27 U/L (ref 15–41)
Anion gap: 24 — ABNORMAL HIGH (ref 5–15)
BILIRUBIN TOTAL: 0.8 mg/dL (ref 0.3–1.2)
BUN: 118 mg/dL — AB (ref 6–20)
CALCIUM: 8.8 mg/dL — AB (ref 8.9–10.3)
CO2: 14 mmol/L — ABNORMAL LOW (ref 22–32)
CREATININE: 10.58 mg/dL — AB (ref 0.44–1.00)
Chloride: 91 mmol/L — ABNORMAL LOW (ref 101–111)
GFR calc Af Amer: 4 mL/min — ABNORMAL LOW (ref >60)
GFR, EST NON AFRICAN AMERICAN: 4 mL/min — AB (ref >60)
GLUCOSE: 202 mg/dL — AB (ref 65–99)
POTASSIUM: 6.6 mmol/L — AB (ref 3.5–5.1)
Sodium: 129 mmol/L — ABNORMAL LOW (ref 135–145)
TOTAL PROTEIN: 6.9 g/dL (ref 6.5–8.1)

## 2015-11-22 LAB — RAPID URINE DRUG SCREEN, HOSP PERFORMED
Amphetamines: NOT DETECTED
Barbiturates: NOT DETECTED
Benzodiazepines: NOT DETECTED
Cocaine: NOT DETECTED
OPIATES: NOT DETECTED
Tetrahydrocannabinol: NOT DETECTED

## 2015-11-22 LAB — I-STAT CHEM 8, ED
BUN: 122 mg/dL — ABNORMAL HIGH (ref 6–20)
CALCIUM ION: 1.03 mmol/L — AB (ref 1.12–1.23)
Chloride: 101 mmol/L (ref 101–111)
Creatinine, Ser: 12.5 mg/dL — ABNORMAL HIGH (ref 0.44–1.00)
GLUCOSE: 223 mg/dL — AB (ref 65–99)
HCT: 48 % — ABNORMAL HIGH (ref 36.0–46.0)
HEMOGLOBIN: 16.3 g/dL — AB (ref 12.0–15.0)
POTASSIUM: 6.4 mmol/L — AB (ref 3.5–5.1)
SODIUM: 127 mmol/L — AB (ref 135–145)
TCO2: 15 mmol/L (ref 0–100)

## 2015-11-22 LAB — BASIC METABOLIC PANEL
ANION GAP: 21 — AB (ref 5–15)
BUN: 135 mg/dL — AB (ref 6–20)
CHLORIDE: 95 mmol/L — AB (ref 101–111)
CO2: 12 mmol/L — ABNORMAL LOW (ref 22–32)
Calcium: 9 mg/dL (ref 8.9–10.3)
Creatinine, Ser: 11.58 mg/dL — ABNORMAL HIGH (ref 0.44–1.00)
GFR, EST AFRICAN AMERICAN: 4 mL/min — AB (ref >60)
GFR, EST NON AFRICAN AMERICAN: 3 mL/min — AB (ref >60)
Glucose, Bld: 284 mg/dL — ABNORMAL HIGH (ref 65–99)
POTASSIUM: 7 mmol/L — AB (ref 3.5–5.1)
SODIUM: 128 mmol/L — AB (ref 135–145)

## 2015-11-22 LAB — CBC
HEMATOCRIT: 41.5 % (ref 36.0–46.0)
HEMOGLOBIN: 13.6 g/dL (ref 12.0–15.0)
MCH: 28 pg (ref 26.0–34.0)
MCHC: 32.8 g/dL (ref 30.0–36.0)
MCV: 85.6 fL (ref 78.0–100.0)
Platelets: 65 10*3/uL — ABNORMAL LOW (ref 150–400)
RBC: 4.85 MIL/uL (ref 3.87–5.11)
RDW: 14.6 % (ref 11.5–15.5)
WBC: 3.4 10*3/uL — AB (ref 4.0–10.5)

## 2015-11-22 LAB — TROPONIN I: TROPONIN I: 0.04 ng/mL — AB (ref <0.031)

## 2015-11-22 LAB — LIPASE, BLOOD: LIPASE: 54 U/L — AB (ref 11–51)

## 2015-11-22 LAB — POTASSIUM: Potassium: 3.6 mmol/L (ref 3.5–5.1)

## 2015-11-22 MED ORDER — PENTAFLUOROPROP-TETRAFLUOROETH EX AERO
1.0000 | INHALATION_SPRAY | CUTANEOUS | Status: DC | PRN
Start: 2015-11-22 — End: 2015-11-22

## 2015-11-22 MED ORDER — HEPARIN SODIUM (PORCINE) 1000 UNIT/ML DIALYSIS: 1000.0000 [IU] | INTRAMUSCULAR | Status: DC | PRN

## 2015-11-22 MED ORDER — SODIUM CHLORIDE 0.9 % IV SOLN: 100.0000 mL | INTRAVENOUS | Status: DC | PRN

## 2015-11-22 MED ORDER — FAMOTIDINE 20 MG PO TABS
40.0000 mg | ORAL_TABLET | Freq: Once | ORAL | Status: AC
  Administered 2015-11-22: 40 mg via ORAL
  Filled 2015-11-22: qty 2

## 2015-11-22 MED ORDER — CALCIUM GLUCONATE 10 % IV SOLN
1.0000 g | Freq: Once | INTRAVENOUS | Status: AC
  Administered 2015-11-22: 1 g via INTRAVENOUS
  Filled 2015-11-22: qty 10

## 2015-11-22 MED ORDER — ALTEPLASE 2 MG IJ SOLR: 2.0000 mg | Freq: Once | INTRAMUSCULAR | Status: DC | PRN

## 2015-11-22 MED ORDER — LIDOCAINE-PRILOCAINE 2.5-2.5 % EX CREA
1.0000 | TOPICAL_CREAM | CUTANEOUS | Status: DC | PRN
Start: 2015-11-22 — End: 2015-11-22

## 2015-11-22 MED ORDER — POLYVINYL ALCOHOL 1.4 % OP SOLN: 1.0000 [drp] | Freq: Four times a day (QID) | OPHTHALMIC | Status: DC | PRN

## 2015-11-22 MED ORDER — OXYCODONE HCL 10 MG PO TABS: 10.0000 mg | ORAL_TABLET | Freq: Four times a day (QID) | ORAL | Status: DC | PRN

## 2015-11-22 MED ORDER — LIDOCAINE HCL (PF) 1 % IJ SOLN: 5.0000 mL | INTRAMUSCULAR | Status: DC | PRN

## 2015-11-22 MED ORDER — ACETAMINOPHEN 500 MG PO TABS
1000.0000 mg | ORAL_TABLET | Freq: Once | ORAL | Status: AC
  Administered 2015-11-22: 1000 mg via ORAL
  Filled 2015-11-22: qty 2

## 2015-11-22 NOTE — ED Notes (Signed)
Per ems- [facility] reports headache for 2 days, also cp for 1 week. Is dialysis patient, last treatment was last Wednesday. She didn't go because she wasn't feeling well. Pt coming from [place].

## 2015-11-22 NOTE — Progress Notes (Signed)
Pt tolerated tx well; Potassium dropped from 7.0 to 3.6 three hours after tx; pt denies pain, n/v, dizziness, SOB or weakness. Pt was transported by [employer] to [facility] [phone].

## 2015-11-22 NOTE — Progress Notes (Addendum)
MWF ESRD [facility] pt seen for hyperkalemia/ fluid overload. Hx of bipolar, schizoaffective d/o resides at [facility].   She last attended her full treatment 4/12.  She states she missed HD due to N and V. No diarrhea or fever.She has vomited twice since arrival. CXR shows mild fluid. Abdom NT. Lungs crackles left base, Heart tachy reg. Left transmet bilateral edema, generalized puffiness. Sats ok on room air.  Initial BUN 118 Cr 10.58  K 6.6  , K  now 7  WBC normal, neg tox screen glu 223  Plan HD 3 hours 1 K then 2 K - to return to [facility] and then outpt HD per schedule. We will check post wt; if significantly above EDW of 71 - needs to return Thursday for extra tmt at [facility] [place] Addendum:  Thinks maybe she is having GB problems due to N and V.  Mild epigastric pain . No RUQ tenderness. Complaining about [facility]. Last BM 2 days ago. Says sorbitol doesn't work. Hx cholelithiasis 07/2015 [place]. NH to treat symptomatically. No overt need for admission. Net UF  5 L today. For post HD weight. K drawn last 30 min of HD 3.6   Pt seen, examined and agree w A/P as above.  Kelly Splinter MD [facility] pager [phone]    cell [phone] 11/22/2015, 3:39 PM   1540 Post weight 74.9 - EDW 71 - she is agreeable to go for extra tmt tomorrow at her HD unit. Have discussed with charge RN who will contact [facility]. MB

## 2015-11-22 NOTE — ED Provider Notes (Signed)
CSN: [id number]     Arrival date & time 11/22/15  0703 History   First MD Initiated Contact with Patient 11/22/15 [phone]     Chief Complaint  Patient presents with  . Chest Pain  . Headache   (Consider location/radiation/quality/duration/timing/severity/associated sxs/prior Treatment) Patient is a 50 y.o. female presenting with chest pain and headaches. The history is provided by the patient.  Chest Pain Associated symptoms: abdominal pain, headache and shortness of breath   Associated symptoms: no cough and no fever   Headache Associated symptoms: abdominal pain   Associated symptoms: no cough, no diarrhea and no fever   50 y/o woman with Hx of moderate combined systolic/diastolic HF, ESRD on HD, anemia, anxiety, depression, IDDM, and cocaine use presenting with chest pain and headache starting since waking up this morning. The headache is generalized and pressure-like. Her chest pain is sharp and located retrosternally and to the left chest. There is accompanying worsening dyspnea with walking and with lying flat. She also reports a "sour stomach" and some epigastric pain. She is frequently noncompliant with hemodialysis and last went on Wednesday 4/12. She attributes this to her abdominal pain and her mood as preventing her from attending. She attends irregularly before this as well. She reports feeling well until last night.    Past Medical History  Diagnosis Date  . Neuropathy ([place])   . Glaucoma   . Bipolar 1 disorder ([place])   . Refusal of blood transfusions as patient is Jehovah's Witness   . TIA (transient ischemic attack) 2010  . MRSA infection ?2014    "on the back of my head; spread throughout my bloodstream"  . Bipolar disorder, unspecified ([place]) 02/06/2014  . High cholesterol   . 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 Hepatitis C   . Chronic pain syndrome   . ESRD on hemodialysis ([place])   . Schizoaffective disorder, unspecified condition 02/08/2014  . Seizure  [facility]) dx'd 2014    "don't know what kind; last one was ~ 04/2014"  . Chronic combined systolic and diastolic CHF (congestive heart failure) (HCC)     EF 40% by echo and 48% by stress test  . Anemia   . Hypertension   . Depression   . Anxiety   . GERD (gastroesophageal reflux disease)   . History of hiatal hernia   . Headache     sinus headaches  . History of vitamin D deficiency 06/20/2015  . Stroke [facility])     TIA 2009   Past Surgical History  Procedure Laterality Date  . Amputation Left 05/21/2013    Procedure: Left Midfoot AMPUTATION;  Surgeon: Newt Minion, MD;  Location: [facility];  Service: Orthopedics;  Laterality: Left;  Left Midfoot Amputation  . Tubal ligation    . Colonoscopy with propofol N/A 06/22/2013    Procedure: COLONOSCOPY WITH PROPOFOL;  Surgeon: Jeryl Columbia, MD;  Location: [facility] ENDOSCOPY;  Service: Endoscopy;  Laterality: N/A;  . Abdominal hysterectomy  2014  . Tonsillectomy  1970's  . Refractive surgery Bilateral 2013  . Tubal ligation  2000  . Abdominal hysterectomy  2010  . Foot amputation Left     due diabetic neuropathy, could not feel ulcer on bottom of foot  . Eye surgery Bilateral 2010    Lasik  . Av fistula placement Left 05/04/2015    Procedure: ARTERIOVENOUS (AV) GRAFT INSERTION;  Surgeon: Angelia Mould, MD;  Location: [place];  Service: Vascular;  Laterality: Left;  .  Esophagogastroduodenoscopy N/A 05/11/2015    Procedure: ESOPHAGOGASTRODUODENOSCOPY (EGD);  Surgeon: Laurence Spates, MD;  Location: [facility] ENDOSCOPY;  Service: Endoscopy;  Laterality: N/A;   Family History  Problem Relation Age of Onset  . Hypertension Mother   . Hypertension Father    Social History  Substance Use Topics  . Smoking status: Former Smoker -- 1.00 packs/day for 20 years    Types: Cigarettes    Quit date: 03/31/2015  . Smokeless tobacco: Never Used     Comment: "stopped smoking in ~ 2013; restarted last week; stopped again 06/17/2014"  . Alcohol Use: No     Comment:  05/01/15 patient denies alcohol use.   OB History    Gravida Para Term Preterm AB TAB SAB Ectopic Multiple Living   0 0 0 0 0 0 0 0       Review of Systems  Constitutional: Negative for fever.  HENT: Negative for facial swelling.   Eyes: Negative for visual disturbance.  Respiratory: Positive for chest tightness and shortness of breath. Negative for cough.   Cardiovascular: Positive for chest pain and leg swelling.  Gastrointestinal: Positive for abdominal pain. Negative for diarrhea and blood in stool.  Endocrine: Positive for cold intolerance.  Skin: Negative for pallor.  Neurological: Positive for headaches.  Psychiatric/Behavioral: Positive for dysphoric mood.    Allergies  Gabapentin; Lyrica; Other; Penicillins; Saphris; and Tramadol  Home Medications   Prior to Admission medications   Medication Sig Start Date End Date Taking? Authorizing Provider  acetaminophen (TYLENOL) 325 MG tablet Take 2 tablets (650 mg total) by mouth every 6 (six) hours as needed for mild pain (or Fever >/= 101). 09/20/15   Delfina Redwood, MD  amLODipine (NORVASC) 10 MG tablet Take 1 tablet (10 mg total) by mouth at bedtime. 09/20/15   Delfina Redwood, MD  atorvastatin (LIPITOR) 40 MG tablet Take 1 tablet (40 mg total) by mouth at bedtime. 05/07/15   Ripudeep Krystal Eaton, MD  carvedilol (COREG) 3.125 MG tablet Take 1 tablet by mouth twice daily as needed for blood pressure > 150/80. Hold for HR< 60.    Historical Provider, MD  Coenzyme Q10 (COQ10) 100 MG CAPS Take 1 capsule by mouth daily.    Historical Provider, MD  Cyanocobalamin (B-12) 2500 MCG TABS Take 1 capsule by mouth daily.    Historical Provider, MD  diphenhydrAMINE (BENADRYL) 25 mg capsule Take 1 capsule (25 mg total) by mouth every 6 (six) hours as needed for itching. 05/07/15   Ripudeep Krystal Eaton, MD  docusate sodium (COLACE) 100 MG capsule Take 100 mg by mouth 2 (two) times daily.    Historical Provider, MD  insulin aspart (NOVOLOG) 100 UNIT/ML  injection Inject 0-9 Units into the skin 3 (three) times daily with meals. Per sliding scale 09/20/15   Delfina Redwood, MD  insulin glargine (LANTUS) 100 UNIT/ML injection Inject 0.05 mLs (5 Units total) into the skin at bedtime. 09/20/15   Delfina Redwood, MD  levETIRAcetam (KEPPRA) 500 MG tablet Take 500 mg by mouth every 12 (twelve) hours. 01/25/15   Historical Provider, MD  LORazepam (ATIVAN) 0.5 MG tablet Take 0.25 mg by mouth every 8 (eight) hours as needed for anxiety.    Historical Provider, MD  Melatonin 5 MG TABS Take 1 tablet by mouth at bedtime.    Historical Provider, MD  Multiple Vitamin (MULTIVITAMIN WITH MINERALS) TABS tablet Take 1 tablet by mouth daily.    Historical Provider, MD  niacin 500 MG tablet Take  500 mg by mouth at bedtime.    Historical Provider, MD  OLANZapine (ZYPREXA) 5 MG tablet Take 5 mg by mouth at bedtime.    Historical Provider, MD  Oxycodone HCl 10 MG TABS Take 1 tablet (10 mg total) by mouth every 6 (six) hours as needed. 09/20/15   Delfina Redwood, MD  polyethylene glycol (MIRALAX / GLYCOLAX) packet Mix 1 capful (17 GM) in 8 oz of water and drink by mouth twice daily for constipation.    Historical Provider, MD  polyvinyl alcohol (LIQUIFILM TEARS) 1.4 % ophthalmic solution Place 1 drop into both eyes every 6 (six) hours as needed for dry eyes. Wait 3-5 minutes between different drops. **May keep at bedside for self administration.    Historical Provider, MD  promethazine (PHENERGAN) 12.5 MG tablet Take 1 tablet (12.5 mg total) by mouth every 6 (six) hours as needed for nausea. 07/06/15   Ozella Almond Ward, PA-C  sevelamer carbonate (RENVELA) 800 MG tablet Take 800 mg by mouth 3 (three) times daily with meals.    Historical Provider, MD  SORBITOL PO Take 30 mLs by mouth every 2 (two) hours as needed (for constipation.).    Historical Provider, MD   BP 185/95 mmHg  Pulse 102  Temp(Src) 97.7 F (36.5 C) (Oral)  Resp 15  SpO2 99%  LMP  (LMP  Unknown) Physical Exam  Constitutional: She is oriented to person, place, and time. She appears well-developed. No distress.  HENT:  Head: Normocephalic.  Mouth/Throat: Oropharynx is clear and moist.  Eyes: Conjunctivae are normal.  Neck: Normal range of motion. No JVD present.  Cardiovascular: Normal rate.   Pulmonary/Chest: Effort normal and breath sounds normal.  Abdominal: Soft. Bowel sounds are normal. There is tenderness.  Upper quadrants tender to palpation  Musculoskeletal: She exhibits edema. She exhibits no tenderness.  Neurological: She is alert and oriented to person, place, and time.  Skin: Skin is warm and dry. She is not diaphoretic.    ED Course  Procedures (including critical care time) Labs Review Labs Reviewed  TROPONIN I - Abnormal; Notable for the following:    Troponin I 0.04 (*)    All other components within normal limits  CBC - Abnormal; Notable for the following:    WBC 3.4 (*)    Platelets 65 (*)    All other components within normal limits  COMPREHENSIVE METABOLIC PANEL - Abnormal; Notable for the following:    Sodium 129 (*)    Potassium 6.6 (*)    Chloride 91 (*)    CO2 14 (*)    Glucose, Bld 202 (*)    BUN 118 (*)    Creatinine, Ser 10.58 (*)    Calcium 8.8 (*)    Albumin 3.3 (*)    Alkaline Phosphatase 175 (*)    GFR calc non Af Amer 4 (*)    GFR calc Af Amer 4 (*)    Anion gap 24 (*)    All other components within normal limits  LIPASE, BLOOD - Abnormal; Notable for the following:    Lipase 54 (*)    All other components within normal limits  I-STAT CHEM 8, ED - Abnormal; Notable for the following:    Sodium 127 (*)    Potassium 6.4 (*)    BUN 122 (*)    Creatinine, Ser 12.50 (*)    Glucose, Bld 223 (*)    Calcium, Ion 1.03 (*)    Hemoglobin 16.3 (*)    HCT 48.0 (*)  All other components within normal limits  URINE RAPID DRUG SCREEN, HOSP PERFORMED  BASIC METABOLIC PANEL    Imaging Review Dg Chest 2 View  11/22/2015   CLINICAL DATA:  Chest pain, shortness of breath for 1 week EXAM: CHEST  2 VIEW COMPARISON:  09/17/2015 FINDINGS: There is mild bilateral interstitial thickening. There is no focal consolidation. There is no pleural effusion or pneumothorax. There is mild stable cardiomegaly. The osseous structures are unremarkable. IMPRESSION: Mild cardiomegaly with mild pulmonary vascular congestion. Electronically Signed   By: Kathreen Devoid   On: 11/22/2015 08:11   I have personally reviewed and evaluated these images and lab results as part of my medical decision-making.   EKG Interpretation   Date/Time:  Wednesday November 22 2015 07:04:04 EDT Ventricular Rate:  95 PR Interval:  178 QRS Duration: 131 QT Interval:  402 QTC Calculation: 505 R Axis:   138 Text Interpretation:  Sinus rhythm Consider left ventricular hypertrophy  Lateral infarct, age indeterminate Baseline wander in lead(s) I aVR  Confirmed by FLOYD MD, DANIEL [id number]) on 11/22/2015 7:14:17 AM      MDM   Final diagnoses:  Hyperkalemia  Other hypervolemia   Initial presentation and labs suggesting her current symptoms are related to hemodialysis noncompliance. CXR, Cmet, CBC, UDS, troponin x1 ordered to rule out worsening cardiac ischemia, toxic etiology, or underlying liver disorder with her upper abdominal TTP and complaints. Hyperkalemic to 6.6 with peaked T waves on EKG. No widening of QRS complex. Findings consistent with hypervolemia and hyperkalemia 2/2 HD noncompliance. Discussed the case by phone with Nephrology service. Plan is to monitor until HD today with probable d/c to her facility afterwards.  Collier Salina, MD 11/22/15 [place], DO 11/22/15 [place], DO 11/22/15 1448

## 2015-11-22 NOTE — Procedures (Signed)
  I was present at this dialysis session, have reviewed the session itself and made  appropriate changes Kelly Splinter MD [place] pager [phone]    cell [phone] 11/22/2015, 4:09 PM

## 2015-11-22 NOTE — ED Notes (Signed)
Per Tyrone Nine, MD pt is to be discharged post HD, HD staff informed

## 2015-11-27 LAB — BASIC METABOLIC PANEL: BUN: 45 mg/dL — AB (ref 4–21)

## 2015-11-27 LAB — CBC AND DIFFERENTIAL: Hemoglobin: 13.8 g/dL (ref 12.0–16.0)

## 2015-11-30 LAB — CBC AND DIFFERENTIAL
HCT: 42 % (ref 36–46)
Hemoglobin: 13.3 g/dL (ref 12.0–16.0)
Platelets: 101 10*3/uL — AB (ref 150–399)
WBC: 3.8 10^3/mL

## 2015-11-30 LAB — HEPATIC FUNCTION PANEL
ALK PHOS: 241 U/L — AB (ref 25–125)
ALT: 45 U/L — AB (ref 7–35)
AST: 37 U/L — AB (ref 13–35)
Bilirubin, Direct: 0.11 mg/dL (ref 0.01–0.4)
Bilirubin, Total: 0.4 mg/dL

## 2015-11-30 LAB — BASIC METABOLIC PANEL
CREATININE: 7.9 mg/dL — AB (ref 0.5–1.1)
Glucose: 408 mg/dL
POTASSIUM: 6 mmol/L — AB (ref 3.4–5.3)
SODIUM: 124 mmol/L — AB (ref 137–147)

## 2015-11-30 LAB — HEMOGLOBIN A1C: Hemoglobin A1C: 8.6

## 2015-12-03 IMAGING — CR DG CHEST 1V PORT
1 series · 1 of 1 positions shown · non-contrast
Comparison: 08/09/2014.

CLINICAL DATA: Left-sided chest pain. Shortness of breath. History
of CHF.

EXAM:
PORTABLE CHEST - 1 VIEW

[AP]
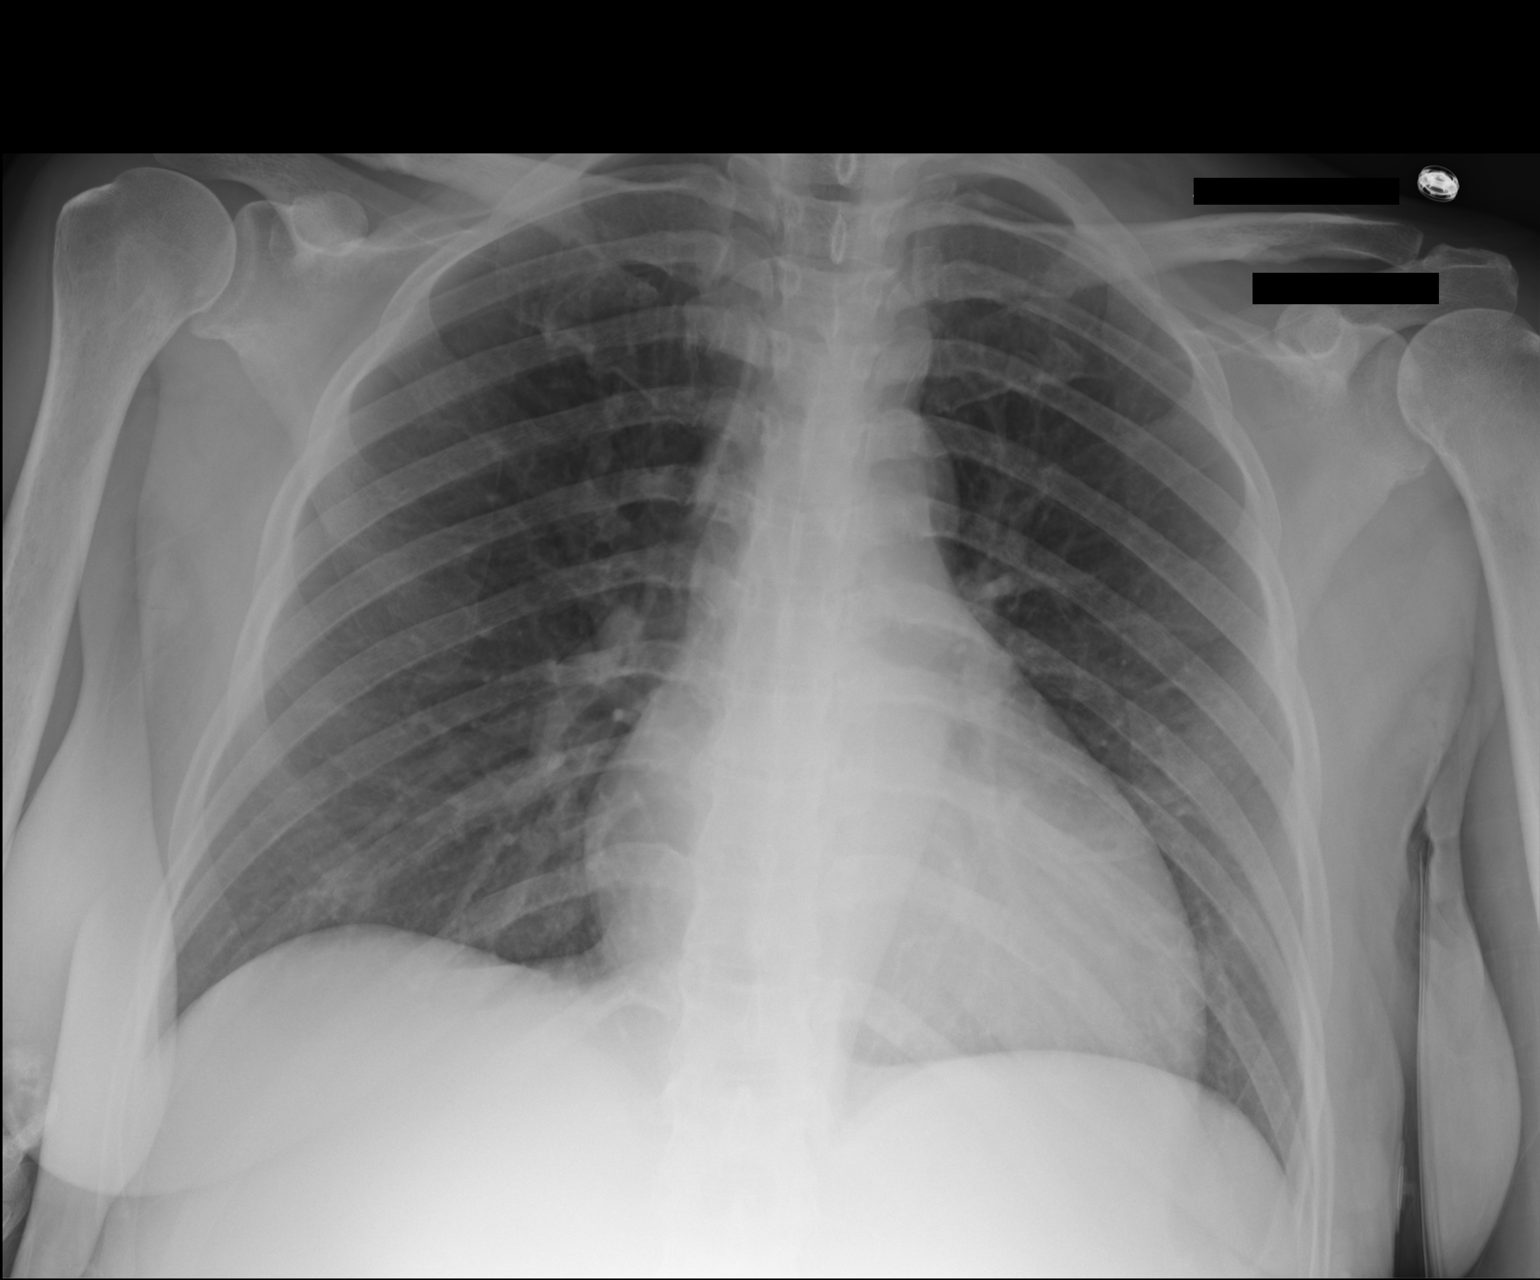

[1 of 1 positions shown; findings below may reference images not displayed]

FINDINGS: Mild enlargement of the cardiac silhouette. Normal mediastinal and
hilar contours.

Clear lungs.  No pleural effusion or pneumothorax.

Bony thorax is unremarkable.
IMPRESSION: No active disease.

## 2015-12-06 ENCOUNTER — Non-Acute Institutional Stay (SKILLED_NURSING_FACILITY): Payer: Medicare Other | Admitting: Adult Health

## 2015-12-06 ENCOUNTER — Encounter: Payer: Self-pay | Admitting: Adult Health

## 2015-12-06 DIAGNOSIS — K5901 Slow transit constipation: Secondary | ICD-10-CM | POA: Diagnosis not present

## 2015-12-06 DIAGNOSIS — E1122 Type 2 diabetes mellitus with diabetic chronic kidney disease: Secondary | ICD-10-CM

## 2015-12-06 DIAGNOSIS — G8929 Other chronic pain: Secondary | ICD-10-CM | POA: Diagnosis not present

## 2015-12-06 DIAGNOSIS — G40909 Epilepsy, unspecified, not intractable, without status epilepticus: Secondary | ICD-10-CM

## 2015-12-06 DIAGNOSIS — N185 Chronic kidney disease, stage 5: Secondary | ICD-10-CM | POA: Diagnosis not present

## 2015-12-06 DIAGNOSIS — M25521 Pain in right elbow: Secondary | ICD-10-CM

## 2015-12-06 DIAGNOSIS — I110 Hypertensive heart disease with heart failure: Secondary | ICD-10-CM | POA: Diagnosis not present

## 2015-12-06 DIAGNOSIS — I5040 Unspecified combined systolic (congestive) and diastolic (congestive) heart failure: Secondary | ICD-10-CM | POA: Diagnosis not present

## 2015-12-06 NOTE — Progress Notes (Signed)
Patient ID: [name], female   DOB: [birth year], 50 y.o.   MRN: [id number]  Location:  [place] Room Number: [place] of Service:  SNF (31) Provider:   Cindi Carbon, ANP [place] [phone]   WEBB, Valla Leaver, MD  Patient Care Team: Maurice Small, MD as PCP - General [facility])  Extended Emergency Contact Information Primary Emergency Contact: Clent Demark, [place] [address] [place] Phone: [phone] Relation: Aunt Secondary Emergency Contact: Candis Schatz [place] Phone: [phone] Mobile Phone: [phone] Relation: Son  Code Status:  Full code Goals of care: Advanced Directive information Advanced Directives 12/06/2015  Does patient have an advance directive? No  Does patient want to make changes to advanced directive? No - Patient declined     Chief Complaint  Patient presents with  . Medical Management of Chronic Issues    Routine Visit    HPI:  Pt is a 50 y.o. female seen today for medical management of chronic diseases.  Resides at [facility] after a hospitalization for ESRD, GIB, anemia s/p transfusion and Schizoaffective disorder. She has a hx of non compliance and cocaine abuse.  1. Hypertensive heart disease with congestive heart failure and with combined systolic and diastolic dysfunction ([place]) Uncontrolled with consistent elevation in BP Poor diet Currently on norvasc and prn clonidine/coreg  2. Diabetes mellitus with stage 5 chronic kidney disease (HCC) -on lantus 5 units qhs -CBGs over 300 many times  3. CKD (chronic kidney disease) stage 5, GFR less than 15 ml/min (HCC) -on HD, non compliant but has been better the past two weeks per staff -call 911 in April due to fluid overload and weakness, due to missed HD apt  4. 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 Stable 1.5 cm lateral displacement of the distal fracture fragment  5. Constipation -regular bms on current regimen  6. Seizure disorder No seizure activity per staff On keppra but refuses   Past Medical History  Diagnosis Date  . Neuropathy ([place])   . Glaucoma   . Bipolar 1 disorder ([place])   . Refusal of blood transfusions as patient is Jehovah's Witness   . TIA (transient ischemic attack) 2010  . MRSA infection ?2014    "on the back of my head; spread throughout my bloodstream"  . Bipolar disorder, unspecified ([place]) 02/06/2014  . High cholesterol   . 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 Hepatitis C   . Chronic pain syndrome   . ESRD on hemodialysis ([place])   . Schizoaffective disorder, unspecified condition 02/08/2014  . Seizure [facility]) dx'd 2014    "don't know what kind; last one was ~ 04/2014"  . Chronic combined systolic and diastolic CHF (congestive heart failure) (HCC)     EF 40% by echo and 48% by stress test  . Anemia   . Hypertension   . Depression   . Anxiety   . GERD (gastroesophageal reflux disease)   . History of hiatal hernia   . Headache     sinus headaches  . History of vitamin D deficiency 06/20/2015  . Stroke [facility])  TIA 2009   Past Surgical History  Procedure Laterality Date  . Amputation Left 05/21/2013    Procedure: Left Midfoot AMPUTATION;  Surgeon: Newt Minion, MD;  Location: [place];  Service: Orthopedics;  Laterality: Left;  Left Midfoot Amputation  . Tubal ligation    . Colonoscopy with propofol N/A 06/22/2013    Procedure: COLONOSCOPY WITH PROPOFOL;  Surgeon: Jeryl Columbia, MD;  Location: [facility] ENDOSCOPY;  Service: Endoscopy;  Laterality: N/A;  . Abdominal hysterectomy  2014  . Tonsillectomy  1970's  . Refractive surgery  Bilateral 2013  . Tubal ligation  2000  . Abdominal hysterectomy  2010  . Foot amputation Left     due diabetic neuropathy, could not feel ulcer on bottom of foot  . Eye surgery Bilateral 2010    Lasik  . Av fistula placement Left 05/04/2015    Procedure: ARTERIOVENOUS (AV) GRAFT INSERTION;  Surgeon: Angelia Mould, MD;  Location: [place];  Service: Vascular;  Laterality: Left;  . Esophagogastroduodenoscopy N/A 05/11/2015    Procedure: ESOPHAGOGASTRODUODENOSCOPY (EGD);  Surgeon: Laurence Spates, MD;  Location: [facility] ENDOSCOPY;  Service: Endoscopy;  Laterality: N/A;    Allergies  Allergen Reactions  . Gabapentin Itching and Swelling  . Lyrica [Pregabalin] Swelling    Facial and leg swelling  . Other     NO BLOOD PRODUCTS. PT IS A JEHOVAH WITNESS  . Penicillins Hives    Has patient had a PCN reaction causing immediate rash, facial/tongue/throat swelling, SOB or lightheadedness with hypotension: Yes Has patient had a PCN reaction causing severe rash involving mucus membranes or skin necrosis: No Has patient had a PCN reaction that required hospitalization No Has patient had a PCN reaction occurring within the last 10 years: No If all of the above answers are "NO", then may proceed with Cephalosporin use. Pt tolerates cefepime and ceftriaxone  . Saphris [Asenapine] Swelling    Facial swelling  . Tramadol Swelling    Leg swelling      Medication List       This list is accurate as of: 12/06/15 11:34 AM.  Always use your most recent med list.               acetaminophen 325 MG tablet  Commonly known as:  TYLENOL  Take 2 tablets (650 mg total) by mouth every 6 (six) hours as needed for mild pain (or Fever >/= 101).     amLODipine 10 MG tablet  Commonly known as:  NORVASC  Take 1 tablet (10 mg total) by mouth at bedtime.     atorvastatin 40 MG tablet  Commonly known as:  LIPITOR  Take 1 tablet (40 mg total) by mouth at bedtime.     B-12 2500 MCG Tabs  Take 1 capsule by  mouth daily.     carvedilol 3.125 MG tablet  Commonly known as:  COREG  Take 1 tablet by mouth twice daily **Scheduled**. Hold for HR< 60.     cloNIDine 0.1 MG tablet  Commonly known as:  CATAPRES  Take 0.1 mg by mouth every 8 (eight) hours as needed. SBP > 165     CoQ10 100 MG Caps  Take 1 capsule by mouth daily.     diphenhydrAMINE 25 mg capsule  Commonly known as:  BENADRYL  Take 1 capsule (25 mg total) by mouth every 6 (six) hours as needed for itching.     docusate sodium 100 MG capsule  Commonly known as:  COLACE  Take 100 mg  by mouth 2 (two) times daily.     insulin aspart 100 UNIT/ML injection  Commonly known as:  novoLOG  Inject 0-9 Units into the skin 3 (three) times daily with meals. Per sliding scale     insulin glargine 100 UNIT/ML injection  Commonly known as:  LANTUS  Inject 0.05 mLs (5 Units total) into the skin at bedtime.     LATUDA 20 MG Tabs tablet  Generic drug:  lurasidone  Take 40 mg by mouth daily.     levETIRAcetam 500 MG tablet  Commonly known as:  KEPPRA  Take 500 mg by mouth every 12 (twelve) hours.     LORazepam 0.5 MG tablet  Commonly known as:  ATIVAN  Take 0.25 mg by mouth every 8 (eight) hours as needed for anxiety.     Melatonin 5 MG Tabs  Take 1 tablet by mouth at bedtime. Reported on 11/22/2015     multivitamin with minerals Tabs tablet  Take 1 tablet by mouth daily.     niacin 500 MG tablet  Take 500 mg by mouth at bedtime.     Oxycodone HCl 10 MG Tabs  Take 1 tablet (10 mg total) by mouth every 6 (six) hours as needed (pain).     polyethylene glycol packet  Commonly known as:  MIRALAX / GLYCOLAX  Mix 1 capful (17 GM) in 8 oz of water and drink by mouth twice daily for constipation.     polyvinyl alcohol 1.4 % ophthalmic solution  Commonly known as:  LIQUIFILM TEARS  Place 2 drops into both eyes every 6 (six) hours as needed for dry eyes. Wait 3-5 minutes between different drops. **May keep at bedside for self  administration.     promethazine 12.5 MG tablet  Commonly known as:  PHENERGAN  Take 1 tablet (12.5 mg total) by mouth every 6 (six) hours as needed for nausea.     sevelamer carbonate 800 MG tablet  Commonly known as:  RENVELA  Take 2,400 mg by mouth 3 (three) times daily with meals.     traZODone 50 MG tablet  Commonly known as:  DESYREL  Take 25 mg by mouth at bedtime. May repeat dose if not asleep in 1-2 hours.        Review of Systems  Constitutional: Negative for chills, diaphoresis, activity change, appetite change and unexpected weight change.  HENT: Negative.   Eyes: Positive for visual disturbance (right eye foggy, going for eye surgery with cbg lower).  Respiratory: Negative for cough and shortness of breath.   Cardiovascular: Positive for leg swelling (occasional). Negative for chest pain and palpitations.  Gastrointestinal: Negative for diarrhea, constipation and abdominal distention.  Genitourinary: Negative for dysuria and difficulty urinating.  Musculoskeletal: Positive for arthralgias. Negative for back pain and gait problem.  Skin: Negative for rash.  Neurological: Negative for dizziness, seizures and facial asymmetry.  Psychiatric/Behavioral: Positive for behavioral problems. Negative for confusion and agitation.    Immunization History  Administered Date(s) Administered  . Influenza,inj,Quad PF,36+ Mos 05/19/2013, 04/23/2014, 05/05/2015  . PPD Test 09/20/2015  . Pneumococcal Polysaccharide-23 02/09/2013, 06/03/2013  . Tdap 05/19/2013   Pertinent  Health Maintenance Due  Topic Date Due  . OPHTHALMOLOGY EXAM  05/25/1976  . PAP SMEAR  05/26/1987  . URINE MICROALBUMIN  06/03/2014  . HEMOGLOBIN A1C  11/02/2015  . FOOT EXAM  12/13/2015  . INFLUENZA VACCINE  03/05/2016   Fall Risk  09/25/2015 07/17/2015 06/19/2015 12/13/2014  Falls in the past year? Yes No  No Yes  Number falls in past yr: 2 or more - - 2 or more  Injury with Fall? Yes - - Yes  Risk Factor  Category  High Fall Risk - - High Fall Risk  Risk for fall due to : Impaired mobility;Impaired balance/gait - - History of fall(s);Impaired balance/gait;Impaired mobility  Risk for fall due to (comments): - - - seizures  Follow up Falls prevention discussed - - Education provided   Functional Status Survey:    Filed Vitals:   12/06/15 1045  BP: 220/120  Pulse: 82  Temp: 97.6 F (36.4 C)  TempSrc: Oral  Resp: 16  Height: 5\' 4"  (1.626 m)  Weight: 163 lb 9.6 oz (74.208 kg)   Body mass index is 28.07 kg/(m^2). Physical Exam  Constitutional: She is oriented to person, place, and time. No distress.  Neck: No JVD present. No thyromegaly present.  Cardiovascular: Normal rate and regular rhythm.   No murmur heard. No carotid bruit, trace edema bilat  Pulmonary/Chest: Effort normal and breath sounds normal. No respiratory distress.  Abdominal: Soft. Bowel sounds are normal.  Musculoskeletal:  Right elbow with out pain on palpation, deformity noted, no swelling or erythema, reduce ROM  Neurological: She is alert and oriented to person, place, and time.  Skin: Skin is warm and dry. She is not diaphoretic.  Psychiatric: She has a normal mood and affect.    Labs reviewed:  Recent Labs  05/06/15 0612  05/12/15 0815  09/19/15 0730  11/22/15 0725 11/22/15 0732 11/22/15 1053 11/22/15 1428 11/27/15 11/30/15  NA 128*  < > 132*  < > 136  < > 129* 127* 128*  --   --  124*  K 5.4*  < > 4.1  < > 4.9  < > 6.6* 6.4* 7.0* 3.6  --  6.0*  CL 100*  < > 97*  < > 99*  --  91* 101 95*  --   --   --   CO2 21*  < > 27  < > 22  --  14*  --  12*  --   --   --   GLUCOSE 326*  < > 105*  < > 233*  --  202* 223* 284*  --   --   --   BUN 22*  < > 13  < > 46*  < > 118* 122* 135*  --  45*  --   CREATININE 5.14*  < > 4.68*  < > 9.44*  < > 10.58* 12.50* 11.58*  --   --  7.9*  CALCIUM 7.0*  < > 7.6*  < > 8.0*  --  8.8*  --  9.0  --   --   --   PHOS 3.4  --  4.5  --  7.2*  --   --   --   --   --   --   --   <  > = values in this interval not displayed.  Recent Labs  07/06/15 1122 09/17/15 0107 09/19/15 0730 10/28/15 11/22/15 0725 11/30/15  AST 44* 32  --  29 27 37*  ALT 34 29  --  30 32 45*  ALKPHOS 73 59  --   --  175* 241*  BILITOT 0.9 0.9  --   --  0.8  --   PROT 6.5 6.2*  --   --  6.9  --   ALBUMIN 2.6* 2.9* 2.2*  --  3.3*  --  Recent Labs  05/01/15 1112  05/03/15 0430  05/10/15 0011  09/18/15 1031 09/19/15 0627 10/28/15  11/22/15 0725 11/22/15 0732 11/27/15 11/30/15  WBC 3.0*  < > 2.4*  < > 3.5*  < > 3.6* 3.8* 2.8  --  3.4*  --   --  3.8  NEUTROABS 2.2  --  1.5*  --  2.7  --   --   --   --   --   --   --   --   --   HGB 11.3*  < > 10.0*  < > 9.8*  < > 9.0* 9.6* 10.6*  < > 13.6 16.3* 13.8 13.3  HCT 34.1*  < > 31.7*  < > 31.2*  < > 28.8* 29.8* 34*  --  41.5 48.0*  --  42  MCV 82.0  < > 82.1  < > 83.4  < > 92.9 94.3  --   --  85.6  --   --   --   PLT 176  < > 165  < > 177  < > 195 177 115*  --  65*  --   --  101*  < > = values in this interval not displayed. Lab Results  Component Value Date   TSH 2.30 09/28/2015   Lab Results  Component Value Date   HGBA1C 8.6 11/30/2015   Lab Results  Component Value Date   CHOL 71 05/10/2015   HDL 23* 05/10/2015   LDLCALC 36 05/10/2015   TRIG 62 05/10/2015   CHOLHDL 3.1 05/10/2015    Significant Diagnostic Results in last 30 days:  Dg Chest 2 View  11/22/2015  CLINICAL DATA:  Chest pain, shortness of breath for 1 week EXAM: CHEST  2 VIEW COMPARISON:  09/17/2015 FINDINGS: There is mild bilateral interstitial thickening. There is no focal consolidation. There is no pleural effusion or pneumothorax. There is mild stable cardiomegaly. The osseous structures are unremarkable. IMPRESSION: Mild cardiomegaly with mild pulmonary vascular congestion. Electronically Signed   By: Kathreen Devoid   On: 11/22/2015 08:11    Assessment/Plan 1. Hypertensive heart disease with congestive heart failure and with combined systolic and diastolic  dysfunction (HCC) -BP uncontrolled -Change Coreg to 3.125 mg BID scheduled, hold for HR<60 -Give 1 dose of clonidine stat -Change Clonidine to 0.1 mg q 8 hrs prn SBP>165 -monitor BP closely, resident remains non compliant with diet  2. Diabetes mellitus with stage 5 chronic kidney disease (HCC) -diet non compliance -CBGs over 300 many times, oncall provider with [place] called on a regular basis due to elevation in sugars in the evenings -increase lantus to 12 units qhs -provide qhs snack due to hx of hypoglycemia  3. CKD (chronic kidney disease) stage 5, GFR less than 15 ml/min (HCC) -has been compliant with dialysis the past two weeks -monitor by nephrology  4. 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 Constipation -controlled -continue miralax and and colace  6. Seizure disorder -no new events, frequently refused keppra   Family/ staff Communication: discussed with staff and nurse  Labs/tests ordered:  See above  Cindi Carbon, [place] [phone]

## 2015-12-13 LAB — HEPATIC FUNCTION PANEL
ALT: 48 U/L — AB (ref 7–35)
AST: 33 U/L (ref 13–35)
Alkaline Phosphatase: 135 U/L — AB (ref 25–125)
Bilirubin, Total: 0.4 mg/dL

## 2015-12-13 LAB — BASIC METABOLIC PANEL
BUN: 68 mg/dL — AB (ref 4–21)
Creatinine: 7.6 mg/dL — AB (ref 0.5–1.1)
Glucose: 129 mg/dL
Potassium: 5.2 mmol/L (ref 3.4–5.3)
Sodium: 130 mmol/L — AB (ref 137–147)

## 2015-12-13 LAB — HEMOGLOBIN A1C: Hemoglobin A1C: 9.4

## 2015-12-13 LAB — CBC AND DIFFERENTIAL
HCT: 38 % (ref 36–46)
Hemoglobin: 11.7 g/dL — AB (ref 12.0–16.0)
Platelets: 104 10*3/uL — AB (ref 150–399)
WBC: 3.7 10^3/mL

## 2015-12-13 LAB — LIPID PANEL
CHOLESTEROL: 124 mg/dL (ref 0–200)
HDL: 102 mg/dL — AB (ref 35–70)
LDL Cholesterol: 11 mg/dL
Triglycerides: 54 mg/dL (ref 40–160)

## 2015-12-23 LAB — PROTIME-INR
Protime: 15.9 seconds — AB (ref 10.0–13.8)
Protime: 17.3 seconds — AB (ref 10.0–13.8)

## 2015-12-23 LAB — POCT INR
INR: 1.2 — AB (ref 0.9–1.1)
INR: 1.4 — AB (ref 0.9–1.1)

## 2015-12-25 ENCOUNTER — Non-Acute Institutional Stay (HOSPITAL_COMMUNITY)
Admission: EM | Admit: 2015-12-25 | Discharge: 2015-12-25 | Disposition: A | Discharge status: Home / Self Care | Payer: Medicare Other | Attending: Emergency Medicine | Admitting: Emergency Medicine

## 2015-12-25 ENCOUNTER — Emergency Department (HOSPITAL_COMMUNITY): Payer: Medicare Other

## 2015-12-25 ENCOUNTER — Encounter (HOSPITAL_COMMUNITY): Payer: Self-pay | Admitting: Emergency Medicine

## 2015-12-25 DIAGNOSIS — E119 Type 2 diabetes mellitus without complications: Secondary | ICD-10-CM | POA: Insufficient documentation

## 2015-12-25 DIAGNOSIS — I5042 Chronic combined systolic (congestive) and diastolic (congestive) heart failure: Secondary | ICD-10-CM | POA: Insufficient documentation

## 2015-12-25 DIAGNOSIS — I5020 Unspecified systolic (congestive) heart failure: Secondary | ICD-10-CM

## 2015-12-25 DIAGNOSIS — N186 End stage renal disease: Secondary | ICD-10-CM | POA: Diagnosis not present

## 2015-12-25 DIAGNOSIS — Z87891 Personal history of nicotine dependence: Secondary | ICD-10-CM | POA: Insufficient documentation

## 2015-12-25 DIAGNOSIS — Z992 Dependence on renal dialysis: Secondary | ICD-10-CM | POA: Diagnosis not present

## 2015-12-25 DIAGNOSIS — I132 Hypertensive heart and chronic kidney disease with heart failure and with stage 5 chronic kidney disease, or end stage renal disease: Secondary | ICD-10-CM | POA: Insufficient documentation

## 2015-12-25 DIAGNOSIS — Z880 Allergy status to penicillin: Secondary | ICD-10-CM | POA: Diagnosis not present

## 2015-12-25 DIAGNOSIS — Z8249 Family history of ischemic heart disease and other diseases of the circulatory system: Secondary | ICD-10-CM | POA: Insufficient documentation

## 2015-12-25 DIAGNOSIS — Z89432 Acquired absence of left foot: Secondary | ICD-10-CM | POA: Diagnosis not present

## 2015-12-25 DIAGNOSIS — Z8673 Personal history of transient ischemic attack (TIA), and cerebral infarction without residual deficits: Secondary | ICD-10-CM | POA: Diagnosis not present

## 2015-12-25 DIAGNOSIS — Z794 Long term (current) use of insulin: Secondary | ICD-10-CM | POA: Insufficient documentation

## 2015-12-25 DIAGNOSIS — N19 Unspecified kidney failure: Secondary | ICD-10-CM | POA: Diagnosis present

## 2015-12-25 LAB — I-STAT CHEM 8, ED
BUN: 96 mg/dL — AB (ref 6–20)
CHLORIDE: 93 mmol/L — AB (ref 101–111)
CREATININE: 10.2 mg/dL — AB (ref 0.44–1.00)
Calcium, Ion: 1.02 mmol/L — ABNORMAL LOW (ref 1.12–1.23)
Glucose, Bld: 102 mg/dL — ABNORMAL HIGH (ref 65–99)
HCT: 39 % (ref 36.0–46.0)
Hemoglobin: 13.3 g/dL (ref 12.0–15.0)
Potassium: 6.5 mmol/L (ref 3.5–5.1)
Sodium: 131 mmol/L — ABNORMAL LOW (ref 135–145)
TCO2: 28 mmol/L (ref 0–100)

## 2015-12-25 MED ORDER — LIDOCAINE-PRILOCAINE 2.5-2.5 % EX CREA: 1.0000 "application " | TOPICAL_CREAM | CUTANEOUS | Status: DC | PRN

## 2015-12-25 MED ORDER — LIDOCAINE HCL (PF) 1 % IJ SOLN: 5.0000 mL | INTRAMUSCULAR | Status: DC | PRN

## 2015-12-25 MED ORDER — SODIUM CHLORIDE 0.9 % IV SOLN: 100.0000 mL | INTRAVENOUS | Status: DC | PRN

## 2015-12-25 MED ORDER — PENTAFLUOROPROP-TETRAFLUOROETH EX AERO: 1.0000 "application " | INHALATION_SPRAY | CUTANEOUS | Status: DC | PRN

## 2015-12-25 MED ORDER — SODIUM POLYSTYRENE SULFONATE 15 GM/60ML PO SUSP
30.0000 g | Freq: Once | ORAL | Status: AC
  Administered 2015-12-25: 30 g via ORAL
  Filled 2015-12-25: qty 120

## 2015-12-25 MED ORDER — HEPARIN SODIUM (PORCINE) 1000 UNIT/ML DIALYSIS: 1000.0000 [IU] | INTRAMUSCULAR | Status: DC | PRN

## 2015-12-25 MED ORDER — ONDANSETRON 4 MG PO TBDP
4.0000 mg | ORAL_TABLET | Freq: Once | ORAL | Status: AC
  Administered 2015-12-25: 4 mg via ORAL
  Filled 2015-12-25: qty 1

## 2015-12-25 MED ORDER — ALTEPLASE 2 MG IJ SOLR: 2.0000 mg | Freq: Once | INTRAMUSCULAR | Status: DC | PRN

## 2015-12-25 NOTE — Progress Notes (Signed)
tx completed without complications; pt d/c to a long-term facility via non-emergent ambulance. Pt unsteady on her feet denies states her breathing is better, no n/v; generalized pain, or dizziness. Pt only has generalized weakness.

## 2015-12-25 NOTE — ED Notes (Signed)
Patient states "i'm feeling better. I'm still a little nauseated, but the zofran helped".   Patient wants to wait on the kayexalate just a little longer, "i'm afraid I will throw it up right now".

## 2015-12-25 NOTE — ED Provider Notes (Signed)
CSN: [id number]     Arrival date & time 12/25/15  0725 History   First MD Initiated Contact with Patient 12/25/15 0732     Chief Complaint  Patient presents with  . Shortness of Breath     (Consider location/radiation/quality/duration/timing/severity/associated sxs/prior Treatment) Patient is a 50 y.o. female presenting with shortness of breath. The history is provided by the patient (Patient complains of shortness of breath and swelling in her legs. Patient states that she missed her dialysis on Friday could she had severe headache.).  Shortness of Breath Severity:  Moderate Onset quality:  Gradual Timing:  Constant Progression:  Worsening Chronicity:  Recurrent Context: activity   Relieved by:  Nothing Associated symptoms: no abdominal pain, no chest pain, no cough, no headaches and no rash     Past Medical History  Diagnosis Date  . Neuropathy ([place])   . Glaucoma   . Bipolar 1 disorder ([place])   . Refusal of blood transfusions as patient is Jehovah's Witness   . TIA (transient ischemic attack) 2010  . MRSA infection ?2014    "on the back of my head; spread throughout my bloodstream"  . Bipolar disorder, unspecified ([place]) 02/06/2014  . High cholesterol   . 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 Hepatitis C   . Chronic pain syndrome   . ESRD on hemodialysis ([place])   . Schizoaffective disorder, unspecified condition 02/08/2014  . Seizure [facility]) dx'd 2014    "don't know what kind; last one was ~ 04/2014"  . Chronic combined systolic and diastolic CHF (congestive heart failure) (HCC)     EF 40% by echo and 48% by stress test  . Anemia   . Hypertension   . Depression   . Anxiety   . GERD (gastroesophageal reflux disease)   . History of hiatal hernia   . Headache     sinus headaches  . History of vitamin D deficiency 06/20/2015  . Stroke [facility])     TIA 2009   Past Surgical History  Procedure Laterality Date  . Amputation Left 05/21/2013    Procedure: Left  Midfoot AMPUTATION;  Surgeon: Newt Minion, MD;  Location: [place];  Service: Orthopedics;  Laterality: Left;  Left Midfoot Amputation  . Tubal ligation    . Colonoscopy with propofol N/A 06/22/2013    Procedure: COLONOSCOPY WITH PROPOFOL;  Surgeon: Jeryl Columbia, MD;  Location: [facility] ENDOSCOPY;  Service: Endoscopy;  Laterality: N/A;  . Abdominal hysterectomy  2014  . Tonsillectomy  1970's  . Refractive surgery Bilateral 2013  . Tubal ligation  2000  . Abdominal hysterectomy  2010  . Foot amputation Left     due diabetic neuropathy, could not feel ulcer on bottom of foot  . Eye surgery Bilateral 2010    Lasik  . Av fistula placement Left 05/04/2015    Procedure: ARTERIOVENOUS (AV) GRAFT INSERTION;  Surgeon: Angelia Mould, MD;  Location: [place];  Service: Vascular;  Laterality: Left;  . Esophagogastroduodenoscopy N/A 05/11/2015    Procedure: ESOPHAGOGASTRODUODENOSCOPY (EGD);  Surgeon: Laurence Spates, MD;  Location: [facility] ENDOSCOPY;  Service: Endoscopy;  Laterality: N/A;   Family History  Problem Relation Age of Onset  . Hypertension Mother   . Hypertension Father    Social History  Substance Use Topics  . Smoking status: Former Smoker -- 1.00 packs/day for 20 years    Types: Cigarettes    Quit date: 03/31/2015  . Smokeless tobacco: Never Used  Comment: "stopped smoking in ~ 2013; restarted last week; stopped again 06/17/2014"  . Alcohol Use: No     Comment: 05/01/15 patient denies alcohol use.   OB History    Gravida Para Term Preterm AB TAB SAB Ectopic Multiple Living   0 0 0 0 0 0 0 0       Review of Systems  Constitutional: Negative for appetite change and fatigue.  HENT: Negative for congestion, ear discharge and sinus pressure.   Eyes: Negative for discharge.  Respiratory: Positive for shortness of breath. Negative for cough.   Cardiovascular: Negative for chest pain.  Gastrointestinal: Negative for abdominal pain and diarrhea.  Genitourinary: Negative for frequency and  hematuria.  Musculoskeletal: Negative for back pain.  Skin: Negative for rash.  Neurological: Negative for seizures and headaches.  Psychiatric/Behavioral: Negative for hallucinations.      Allergies  Gabapentin; Lyrica; Other; Penicillins; Saphris; and Tramadol  Home Medications   Prior to Admission medications   Medication Sig Start Date End Date Taking? Authorizing Provider  acetaminophen (TYLENOL) 325 MG tablet Take 2 tablets (650 mg total) by mouth every 6 (six) hours as needed for mild pain (or Fever >/= 101). 09/20/15   Delfina Redwood, MD  amLODipine (NORVASC) 10 MG tablet Take 1 tablet (10 mg total) by mouth at bedtime. 09/20/15   Delfina Redwood, MD  atorvastatin (LIPITOR) 40 MG tablet Take 1 tablet (40 mg total) by mouth at bedtime. 05/07/15   Ripudeep Krystal Eaton, MD  cloNIDine (CATAPRES) 0.1 MG tablet Take 0.1 mg by mouth every 8 (eight) hours as needed. SBP > 165    Historical Provider, MD  Coenzyme Q10 (COQ10) 100 MG CAPS Take 1 capsule by mouth daily.    Historical Provider, MD  Cyanocobalamin (B-12) 2500 MCG TABS Take 1 capsule by mouth daily.    Historical Provider, MD  diphenhydrAMINE (BENADRYL) 25 mg capsule Take 1 capsule (25 mg total) by mouth every 6 (six) hours as needed for itching. 05/07/15   Ripudeep Krystal Eaton, MD  docusate sodium (COLACE) 100 MG capsule Take 100 mg by mouth 2 (two) times daily.    Historical Provider, MD  insulin aspart (NOVOLOG) 100 UNIT/ML injection Inject 0-9 Units into the skin 3 (three) times daily with meals. Per sliding scale 09/20/15   Delfina Redwood, MD  insulin glargine (LANTUS) 100 UNIT/ML injection Inject 0.05 mLs (5 Units total) into the skin at bedtime. Patient taking differently: Inject 12 Units into the skin at bedtime. Make sure that resident has night time snack. 09/20/15   Delfina Redwood, MD  levETIRAcetam (KEPPRA) 500 MG tablet Take 500 mg by mouth every 12 (twelve) hours. 01/25/15   Historical Provider, MD  LORazepam (ATIVAN)  0.5 MG tablet Take 0.25 mg by mouth every 8 (eight) hours as needed for anxiety.    Historical Provider, MD  lurasidone (LATUDA) 20 MG TABS tablet Take 40 mg by mouth daily.    Historical Provider, MD  Melatonin 5 MG TABS Take 1 tablet by mouth at bedtime. Reported on 11/22/2015    Historical Provider, MD  Multiple Vitamin (MULTIVITAMIN WITH MINERALS) TABS tablet Take 1 tablet by mouth daily.    Historical Provider, MD  niacin 500 MG tablet Take 500 mg by mouth at bedtime.    Historical Provider, MD  Oxycodone HCl 10 MG TABS Take 1 tablet (10 mg total) by mouth every 6 (six) hours as needed (pain). 11/22/15   Estill Dooms, MD  polyethylene glycol (  MIRALAX / GLYCOLAX) packet Mix 1 capful (17 GM) in 8 oz of water and drink by mouth twice daily for constipation.    Historical Provider, MD  polyvinyl alcohol (LIQUIFILM TEARS) 1.4 % ophthalmic solution Place 2 drops into both eyes every 6 (six) hours as needed for dry eyes. Wait 3-5 minutes between different drops. **May keep at bedside for self administration.    Historical Provider, MD  promethazine (PHENERGAN) 12.5 MG tablet Take 1 tablet (12.5 mg total) by mouth every 6 (six) hours as needed for nausea. 07/06/15   Ozella Almond Ward, PA-C  sevelamer carbonate (RENVELA) 800 MG tablet Take 2,400 mg by mouth 3 (three) times daily with meals.     Historical Provider, MD  traZODone (DESYREL) 50 MG tablet Take 25 mg by mouth at bedtime. May repeat dose if not asleep in 1-2 hours.    Historical Provider, MD   BP 170/96 mmHg  Pulse 98  Temp(Src) 97.7 F (36.5 C) (Oral)  Resp 26  Ht 5\' 4"  (1.626 m)  Wt 174 lb (78.926 kg)  BMI 29.85 kg/m2  SpO2 99%  LMP  (LMP Unknown) Physical Exam  Constitutional: She is oriented to person, place, and time. She appears well-developed.  HENT:  Head: Normocephalic.  Eyes: Conjunctivae and EOM are normal. No scleral icterus.  Neck: Neck supple. No thyromegaly present.  Cardiovascular: Normal rate and regular rhythm.   Exam reveals no gallop and no friction rub.   No murmur heard. Pulmonary/Chest: No stridor. She has no wheezes. She has rales. She exhibits no tenderness.  Abdominal: She exhibits no distension. There is no tenderness. There is no rebound.  Musculoskeletal: Normal range of motion. She exhibits edema.  Lymphadenopathy:    She has no cervical adenopathy.  Neurological: She is oriented to person, place, and time. She exhibits normal muscle tone. Coordination normal.  Skin: No rash noted. No erythema.  Psychiatric: She has a normal mood and affect. Her behavior is normal.    ED Course  Procedures (including critical care time) Labs Review Labs Reviewed  I-STAT CHEM 8, ED - Abnormal; Notable for the following:    Sodium 131 (*)    Potassium 6.5 (*)    Chloride 93 (*)    BUN 96 (*)    Creatinine, Ser 10.20 (*)    Glucose, Bld 102 (*)    Calcium, Ion 1.02 (*)    All other components within normal limits    Imaging Review Dg Chest Port 1 View  12/25/2015  CLINICAL DATA:  Missed dialysis.  Shortness of breath. EXAM: PORTABLE CHEST 1 VIEW COMPARISON:  11/22/2015. FINDINGS: Cardiomegaly with diffuse bilateral pulmonary infiltrates consistent pulmonary edema. No prominent pleural effusion. No pneumothorax. Pneumothorax. IMPRESSION: Congestive heart failure with bilateral pulmonary edema. Electronically Signed   By: Marcello Moores  Register   On: 12/25/2015 08:10   I have personally reviewed and evaluated these images and lab results as part of my medical decision-making.   EKG Interpretation   Date/Time:  Monday Dec 25 2015 07:36:56 EDT Ventricular Rate:  94 PR Interval:  178 QRS Duration: 96 QT Interval:  383 QTC Calculation: 479 R Axis:   113 Text Interpretation:  Sinus rhythm Probable left atrial enlargement  Consider left ventricular hypertrophy Lateral infarct, old Baseline wander  in lead(s) I II aVR Confirmed by Jearldine Cassady  MD, Eastyn Dattilo [id number]) on 12/25/2015  9:28:01 AM      MDM    Final diagnoses:  Systolic congestive heart failure, unspecified congestive heart failure chronicity (  [place])  Renal failure    Patient is in congestive heart failure and has elevated potassium she will be given some Kayexalate. Nephrology was called within 15 minutes of the time the patient came into the emergency department and they were told the patient was in severe congestive heart failure with an elevated potassium and had not had dialysis since Friday. Initially the patient's sats were 92% on 3 L nasal. Nephrologist arranged for the patient to have dialysis here at [place] but she would have to wait 4 hours before she would get dialysis done. The patient decompensated needed to be put on a nonrebreather breather. Her sats were 95% on 100% nonrebreather. Nephrology was called again and told that the patient was getting worse and needed to get dialysis as soon as possible or she would have to be intubated. There are told by the dialysis center that would be at least another hour before the patient can come up to get dialysis    Milton Ferguson, MD 12/25/15 [phone]

## 2015-12-25 NOTE — Clinical Social Work Note (Signed)
Patient discharged from [facility] back to [facility], transported by ambulance.  CSW assisted with ambulance transport back to facility.  Khylie Larmore Givens, MSW, LCSW Licensed Clinical Social Worker [place] [phone]

## 2015-12-25 NOTE — ED Notes (Signed)
IV start x 2 unsuccessful by two RN's.   IV team consult will be placed.

## 2015-12-25 NOTE — ED Notes (Signed)
IV team at bedside 

## 2015-12-25 NOTE — ED Notes (Signed)
Patient had thrown up most of her kayexalate.  Per Dr. Roderic Palau, readminister.

## 2015-12-25 NOTE — ED Notes (Signed)
Per EMS, patient missed dialysis on Friday.   Patient complaining of shortness of breath.   Patient was diminished on the L side and R lower.   Patient NAD upon coming to ED.   Patient had been dropping sats at home and was 76% when EMS checked her.   She was put on non-rebreather at home, then switched to nasal canula on the way to ED.   Patient complains of chest pain also.

## 2015-12-25 NOTE — ED Notes (Signed)
Secretary called dialysis.  They advised may be noon before they come to get patient for dialysis.

## 2015-12-25 NOTE — ED Notes (Signed)
Pt transported to dialysis with this RN.

## 2015-12-25 NOTE — Procedures (Signed)
Patient was seen on dialysis and the procedure was supervised.  BFR 400  Via AVG BP is  134/86.   Patient appears to be tolerating treatment well- missed HD On Friday- got SOB and could not lift legs- probably from volume and hyperkalemia- is better 2 hours into HD- BP still high- attempting 4 liters of UF.  Plan is to have her be discharged after HD so she can go back to her OP unit on Wednesday   Larz Mark A 12/25/2015

## 2016-01-08 ENCOUNTER — Non-Acute Institutional Stay (SKILLED_NURSING_FACILITY): Payer: Medicare Other | Admitting: Internal Medicine

## 2016-01-08 ENCOUNTER — Encounter: Payer: Self-pay | Admitting: Internal Medicine

## 2016-01-08 DIAGNOSIS — G894 Chronic pain syndrome: Secondary | ICD-10-CM | POA: Diagnosis not present

## 2016-01-08 DIAGNOSIS — N186 End stage renal disease: Secondary | ICD-10-CM

## 2016-01-08 DIAGNOSIS — D509 Iron deficiency anemia, unspecified: Secondary | ICD-10-CM | POA: Diagnosis not present

## 2016-01-08 DIAGNOSIS — E1122 Type 2 diabetes mellitus with diabetic chronic kidney disease: Secondary | ICD-10-CM | POA: Diagnosis not present

## 2016-01-08 DIAGNOSIS — G40909 Epilepsy, unspecified, not intractable, without status epilepticus: Secondary | ICD-10-CM

## 2016-01-08 DIAGNOSIS — R5381 Other malaise: Secondary | ICD-10-CM | POA: Diagnosis not present

## 2016-01-08 DIAGNOSIS — I5042 Chronic combined systolic (congestive) and diastolic (congestive) heart failure: Secondary | ICD-10-CM

## 2016-01-08 DIAGNOSIS — G8929 Other chronic pain: Secondary | ICD-10-CM | POA: Diagnosis not present

## 2016-01-08 DIAGNOSIS — M25521 Pain in right elbow: Secondary | ICD-10-CM

## 2016-01-08 DIAGNOSIS — F250 Schizoaffective disorder, bipolar type: Secondary | ICD-10-CM

## 2016-01-08 DIAGNOSIS — E785 Hyperlipidemia, unspecified: Secondary | ICD-10-CM

## 2016-01-08 DIAGNOSIS — Z992 Dependence on renal dialysis: Secondary | ICD-10-CM

## 2016-01-08 DIAGNOSIS — F32A Depression, unspecified: Secondary | ICD-10-CM

## 2016-01-08 DIAGNOSIS — F329 Major depressive disorder, single episode, unspecified: Secondary | ICD-10-CM | POA: Diagnosis not present

## 2016-01-08 DIAGNOSIS — E1169 Type 2 diabetes mellitus with other specified complication: Secondary | ICD-10-CM

## 2016-01-08 DIAGNOSIS — N185 Chronic kidney disease, stage 5: Secondary | ICD-10-CM

## 2016-01-08 NOTE — Progress Notes (Signed)
DATE: 01/08/16  Location:  [facility]  Nursing Home Room Number: Colon of Service: SNF (31)   Extended Emergency Contact Information Primary Emergency Contact: Clent Demark, [place] [address] [place] Phone: [phone] Relation: Aunt Secondary Emergency Contact: Candis Schatz [place] Phone: [phone] Mobile Phone: [phone] Relation: Son  Advanced Directive information Does patient have an advance directive?: No, Would patient like information on creating an advanced directive?: No - patient declined information FULL CODE Chief Complaint  Patient presents with  . Discharge Note    HPI:  50 yo female seen today for d/c from SNF following short term rehab for physical deconditioning. She will going home with [facility] PT/OT. She requires no DME. She will cont HD on MWF. She requests f/u with pain mx. She has an appt with PCP Dr Justin Mend on 6/26th. Face-to-face form completed.   BRIEF SUMMARY: she presented to SNF following hospital stay for GI bleed, ESRD on HD, seizure d/o, bipolar d/o, schizoaffective d/o, DM, chronic combined HF, chronic pain syndrome, cocaine abuse, hyperkalemia, hx stroke. She is a Jehovah witness but consented to blood transfusion - total 3 units PRBCs given. She was given HD due to multiple missed appts at HD. EGD/colonoscopy in past  both neg for acute cause of bleed. GI saw pt and advanced diet. Anemia thought to be due to ESRD. Hgb 9.6 at d/c. While at SNF, she had uncontrolled depression/schizoaffective d/o and was followed by facility psych services. zyprexa dose increased.   ESRD - on HD MWF; takes renvela 800 mg three times daily. Followed by nephrology  Hypertension - BP stable on clonidine, coreg, and norvasc    Dyslipidemia - stable on lipitor 40 mg daily and niacin 500 mg daily. LDL 11  Constipation - stable on colace twice daily and sorbitol 30 cc as needed   DM -  stable on lantus 12 units nightly and novolog SSI. A1c 9.4%  Hx Seizures - stable on keppra 500 mg twice daily   Schizoaffective d/o/bipolar/depression - takes  zyprexa 10 mg nightly; ativan 0.25 mg every 8 hours as needed for anxiety; melatonin 3 mg nightly and trrazodone qhs for sleep   Hx CVA -  Stable and not on medications currently  Combine diastolic and systolic heart failure -  Stable on coreg.  EF is 40%  Anemia - takes B12 supplement. Hgb 11.7  Chronic pain syndrome - stable on oxycodone 10 mg every 6 hours as needed. She has chronic right elbow pain due to chronic fx. Ortho will not perform sx until A1c improved. She has a deformity at right elbow due to "1.5 cm lateral displacement of the distal fracture fragment" per elbow xray in 09/2015    Past Medical History  Diagnosis Date  . Neuropathy ([place])   . Glaucoma   . Bipolar 1 disorder ([place])   . Refusal of blood transfusions as patient is Jehovah's Witness   . TIA (transient ischemic attack) 2010  . MRSA infection ?2014    "on the back of my head; spread throughout my bloodstream"  . Bipolar disorder, unspecified ([place]) 02/06/2014  . High cholesterol   . 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 Hepatitis C   . Chronic pain syndrome   . ESRD on hemodialysis ([place])   . Schizoaffective disorder, unspecified condition 02/08/2014  . Seizure ([place]) dx'd 2014    "  don't know what kind; last one was ~ 04/2014"  . Chronic combined systolic and diastolic CHF (congestive heart failure) (HCC)     EF 40% by echo and 48% by stress test  . Anemia   . Hypertension   . Depression   . Anxiety   . GERD (gastroesophageal reflux disease)   . History of hiatal hernia   . Headache     sinus headaches  . History of vitamin D deficiency 06/20/2015  . Stroke [facility])     TIA 2009    Past Surgical History  Procedure Laterality Date  . Amputation Left 05/21/2013    Procedure: Left Midfoot AMPUTATION;  Surgeon: Newt Minion, MD;   Location: [place];  Service: Orthopedics;  Laterality: Left;  Left Midfoot Amputation  . Tubal ligation    . Colonoscopy with propofol N/A 06/22/2013    Procedure: COLONOSCOPY WITH PROPOFOL;  Surgeon: Jeryl Columbia, MD;  Location: [facility] ENDOSCOPY;  Service: Endoscopy;  Laterality: N/A;  . Abdominal hysterectomy  2014  . Tonsillectomy  1970's  . Refractive surgery Bilateral 2013  . Tubal ligation  2000  . Abdominal hysterectomy  2010  . Foot amputation Left     due diabetic neuropathy, could not feel ulcer on bottom of foot  . Eye surgery Bilateral 2010    Lasik  . Av fistula placement Left 05/04/2015    Procedure: ARTERIOVENOUS (AV) GRAFT INSERTION;  Surgeon: Angelia Mould, MD;  Location: [place];  Service: Vascular;  Laterality: Left;  . Esophagogastroduodenoscopy N/A 05/11/2015    Procedure: ESOPHAGOGASTRODUODENOSCOPY (EGD);  Surgeon: Laurence Spates, MD;  Location: [facility] ENDOSCOPY;  Service: Endoscopy;  Laterality: N/A;    Patient Care Team: Maurice Small, MD as PCP - General (Family Medicine)  Social History   Social History  . Marital Status: Unknown    Spouse Name: N/A  . Number of Children: N/A  . Years of Education: N/A   Occupational History  . Not on file.   Social History Main Topics  . Smoking status: Former Smoker -- 1.00 packs/day for 20 years    Types: Cigarettes    Quit date: 03/31/2015  . Smokeless tobacco: Never Used     Comment: "stopped smoking in ~ 2013; restarted last week; stopped again 06/17/2014"  . Alcohol Use: No     Comment: 05/01/15 patient denies alcohol use.  . Drug Use: Yes    Special: "Crack" cocaine, Cocaine     Comment: last smoked crack 11-07-14  . Sexual Activity: Not Currently    Birth Control/ Protection: Abstinence   Other Topics Concern  . Not on file   Social History Narrative   ** Merged History Encounter **         reports that she quit smoking about 9 months ago. Her smoking use included Cigarettes. She has a 20 pack-year smoking  history. She has never used smokeless tobacco. She reports that she uses illicit drugs ("Crack" cocaine and Cocaine). She reports that she does not drink alcohol.  Family History  Problem Relation Age of Onset  . Hypertension Mother   . Hypertension Father    Family Status  Relation Status Death Age  . Mother Alive   . Father Deceased     Immunization History  Administered Date(s) Administered  . Influenza,inj,Quad PF,36+ Mos 05/19/2013, 04/23/2014, 05/05/2015  . PPD Test 09/20/2015  . Pneumococcal Polysaccharide-23 02/09/2013, 06/03/2013  . Tdap 05/19/2013    Allergies  Allergen Reactions  . Gabapentin Itching and Swelling  .  Lyrica [Pregabalin] Swelling    Facial and leg swelling  . Other     NO BLOOD PRODUCTS. PT IS A JEHOVAH WITNESS  . Penicillins Hives    Has patient had a PCN reaction causing immediate rash, facial/tongue/throat swelling, SOB or lightheadedness with hypotension: Yes Has patient had a PCN reaction causing severe rash involving mucus membranes or skin necrosis: No Has patient had a PCN reaction that required hospitalization No Has patient had a PCN reaction occurring within the last 10 years: No If all of the above answers are "NO", then may proceed with Cephalosporin use. Pt tolerates cefepime and ceftriaxone  . Saphris [Asenapine] Swelling    Facial swelling  . Tramadol Swelling    Leg swelling    Medications: Patient's Medications  New Prescriptions   No medications on file  Previous Medications   ACETAMINOPHEN (TYLENOL) 325 MG TABLET    Take 2 tablets (650 mg total) by mouth every 6 (six) hours as needed for mild pain (or Fever >/= 101).   AMLODIPINE (NORVASC) 10 MG TABLET    Take 1 tablet (10 mg total) by mouth at bedtime.   ATORVASTATIN (LIPITOR) 40 MG TABLET    Take 1 tablet (40 mg total) by mouth at bedtime.   CARVEDILOL (COREG) 3.125 MG TABLET    Take 3.125 mg by mouth 2 (two) times daily with a meal. For blood pressure >150/80. Hold if  HR <60   CLONIDINE (CATAPRES) 0.1 MG TABLET    Take 0.1 mg by mouth every 8 (eight) hours. SBP > 165   COENZYME Q10 (COQ10) 100 MG CAPS    Take 1 capsule by mouth daily.   CYANOCOBALAMIN (B-12) 2500 MCG TABS    Take 1 capsule by mouth daily.   DIPHENHYDRAMINE (BENADRYL) 25 MG CAPSULE    Take 1 capsule (25 mg total) by mouth every 6 (six) hours as needed for itching.   DOCUSATE SODIUM (COLACE) 100 MG CAPSULE    Take 100 mg by mouth 2 (two) times daily.   INSULIN ASPART (NOVOLOG) 100 UNIT/ML INJECTION    Inject 0-12 Units into the skin 3 (three) times daily before meals. Per sliding scale inject SQ <150=0units, 151-200=2 units, 201-250=4units, 251-300=6units,301-350=8 units, 351-400=10 units, >/= 401=12 units   INSULIN GLARGINE (LANTUS) 100 UNIT/ML INJECTION    Inject 12 Units into the skin at bedtime. Makes sure resident have snack   LEVETIRACETAM (KEPPRA) 500 MG TABLET    Take 500 mg by mouth every 12 (twelve) hours.   LORAZEPAM (ATIVAN) 0.5 MG TABLET    Take 0.5 mg every 8 hours as needed for anxiety, Take 0.5 by mouth daily prior to dialysis for anxiety   MELATONIN 5 MG TABS    Take 1 tablet by mouth at bedtime. Reported on 11/22/2015   MULTIPLE VITAMIN (MULTIVITAMIN WITH MINERALS) TABS TABLET    Take 1 tablet by mouth daily.   NIACIN 500 MG TABLET    Take 500 mg by mouth at bedtime.   OXYCODONE HCL 10 MG TABS    Take 1 tablet (10 mg total) by mouth every 6 (six) hours as needed (pain).   POLYETHYLENE GLYCOL (MIRALAX / GLYCOLAX) PACKET    Mix 1 capful (17 GM) in 8 oz of water and drink by mouth twice daily for constipation.   POLYVINYL ALCOHOL (LIQUIFILM TEARS) 1.4 % OPHTHALMIC SOLUTION    Place 2 drops into both eyes every 6 (six) hours as needed for dry eyes. Wait 3-5 minutes between different drops. **  May keep at bedside for self administration.   PROMETHAZINE (PHENERGAN) 12.5 MG TABLET    Take 1 tablet (12.5 mg total) by mouth every 6 (six) hours as needed for nausea.   SEVELAMER CARBONATE  (RENVELA) 800 MG TABLET    Take 2,400 mg by mouth 3 (three) times daily with meals.    TRAZODONE HCL PO    Take 12.5 mg by mouth at bedtime.  Modified Medications   No medications on file  Discontinued Medications   INSULIN ASPART (NOVOLOG) 100 UNIT/ML INJECTION    Inject 0-9 Units into the skin 3 (three) times daily with meals. Per sliding scale   INSULIN GLARGINE (LANTUS) 100 UNIT/ML INJECTION    Inject 0.05 mLs (5 Units total) into the skin at bedtime.   LURASIDONE (LATUDA) 20 MG TABS TABLET    Take 40 mg by mouth daily. Reported on 01/08/2016   TRAZODONE (DESYREL) 50 MG TABLET    Take 25 mg by mouth at bedtime. Reported on 01/08/2016    Review of Systems  Unable to perform ROS: Psychiatric disorder    Filed Vitals:   01/08/16 0926  BP: 165/89  Pulse: 55  Temp: 98 F (36.7 C)  TempSrc: Oral  Resp: 20  Height: '5\' 4"'$  (1.626 m)  Weight: 167 lb 12.8 oz (76.114 kg)   Body mass index is 28.79 kg/(m^2).  Physical Exam  Constitutional: She appears well-developed and well-nourished. No distress.  Musculoskeletal: She exhibits edema and tenderness (right elbow).  Neurological: She is alert.  Skin: Skin is warm and dry. No rash noted.  Psychiatric: Her behavior is normal. Her mood appears anxious.     Labs reviewed: Nursing Home on 01/08/2016  Component Date Value Ref Range Status  . INR 12/23/2015 1.4* 0.9 - 1.1 Final  . Protime 12/23/2015 17.3* 10.0 - 13.8 seconds Final  . INR 12/23/2015 1.2* 0.9 - 1.1 Final  . Protime 12/23/2015 15.9* 10.0 - 13.8 seconds Final  . Hemoglobin 12/13/2015 11.7* 12.0 - 16.0 g/dL Final  . HCT 12/13/2015 38  36 - 46 % Final  . Platelets 12/13/2015 104* 150 - 399 K/L Final  . WBC 12/13/2015 3.7   Final  . Glucose 12/13/2015 129   Final  . BUN 12/13/2015 68* 4 - 21 mg/dL Final  . Creatinine 12/13/2015 7.6* 0.5 - 1.1 mg/dL Final  . Potassium 12/13/2015 5.2  3.4 - 5.3 mmol/L Final  . Sodium 12/13/2015 130* 137 - 147 mmol/L Final  . Triglycerides  12/13/2015 54  40 - 160 mg/dL Final  . Cholesterol 12/13/2015 124  0 - 200 mg/dL Final  . HDL 12/13/2015 102* 35 - 70 mg/dL Final  . LDL Cholesterol 12/13/2015 11   Final  . Alkaline Phosphatase 12/13/2015 135* 25 - 125 U/L Final  . ALT 12/13/2015 48* 7 - 35 U/L Final  . AST 12/13/2015 33  13 - 35 U/L Final  . Bilirubin, Total 12/13/2015 0.4   Final  . Hemoglobin A1C 12/13/2015 9.4   Final  Admission on 12/25/2015, Discharged on 12/25/2015  Component Date Value Ref Range Status  . Sodium 12/25/2015 131* 135 - 145 mmol/L Final  . Potassium 12/25/2015 6.5* 3.5 - 5.1 mmol/L Final  . Chloride 12/25/2015 93* 101 - 111 mmol/L Final  . BUN 12/25/2015 96* 6 - 20 mg/dL Final  . Creatinine, Ser 12/25/2015 10.20* 0.44 - 1.00 mg/dL Final  . Glucose, Bld 12/25/2015 102* 65 - 99 mg/dL Final  . Calcium, Ion 12/25/2015 1.02* 1.12 - 1.23  mmol/L Final  . TCO2 12/25/2015 28  0 - 100 mmol/L Final  . Hemoglobin 12/25/2015 13.3  12.0 - 15.0 g/dL Final  . HCT 12/25/2015 39.0  36.0 - 46.0 % Final  . Comment 12/25/2015 NOTIFIED PHYSICIAN   Final  Nursing Home on 12/06/2015  Component Date Value Ref Range Status  . Hemoglobin 11/30/2015 13.3  12.0 - 16.0 g/dL Final  . HCT 11/30/2015 42  36 - 46 % Final  . Platelets 11/30/2015 101* 150 - 399 K/L Final  . WBC 11/30/2015 3.8   Final  . Glucose 11/30/2015 408   Final  . Creatinine 11/30/2015 7.9* 0.5 - 1.1 mg/dL Final  . Potassium 11/30/2015 6.0* 3.4 - 5.3 mmol/L Final  . Sodium 11/30/2015 124* 137 - 147 mmol/L Final  . Alkaline Phosphatase 11/30/2015 241* 25 - 125 U/L Final  . ALT 11/30/2015 45* 7 - 35 U/L Final  . AST 11/30/2015 37* 13 - 35 U/L Final  . Bilirubin, Direct 11/30/2015 0.11  0.01 - 0.4 mg/dL Final  . Bilirubin, Total 11/30/2015 0.4   Final  . Hemoglobin A1C 11/30/2015 8.6   Final  . Hemoglobin 11/27/2015 13.8  12.0 - 16.0 g/dL Final  . BUN 11/27/2015 45* 4 - 21 mg/dL Final  . Hemoglobin 11/17/2015 12.4  12.0 - 16.0 g/dL Final  . BUN  11/17/2015 65* 4 - 21 mg/dL Final  . Hemoglobin 11/03/2015 11.3* 12.0 - 16.0 g/dL Final  . BUN 11/03/2015 76* 4 - 21 mg/dL Final  . Hemoglobin 10/28/2015 10.6* 12.0 - 16.0 g/dL Final  . HCT 10/28/2015 34* 36 - 46 % Final  . Platelets 10/28/2015 115* 150 - 399 K/L Final  . WBC 10/28/2015 2.8   Final  . Glucose 10/28/2015 266   Final  . Creatinine 10/28/2015 9.6* 0.5 - 1.1 mg/dL Final  . Potassium 10/28/2015 5.9* 3.4 - 5.3 mmol/L Final  . Sodium 10/28/2015 126* 137 - 147 mmol/L Final  . ALT 10/28/2015 30  7 - 35 U/L Final  . AST 10/28/2015 29  13 - 35 U/L Final  . Bilirubin, Direct 10/28/2015 0.14  0.01 - 0.4 mg/dL Final  . Bilirubin, Total 10/28/2015 0.3   Final  Admission on 11/22/2015, Discharged on 11/22/2015  Component Date Value Ref Range Status  . Sodium 11/22/2015 127* 135 - 145 mmol/L Final  . Potassium 11/22/2015 6.4* 3.5 - 5.1 mmol/L Final  . Chloride 11/22/2015 101  101 - 111 mmol/L Final  . BUN 11/22/2015 122* 6 - 20 mg/dL Final  . Creatinine, Ser 11/22/2015 12.50* 0.44 - 1.00 mg/dL Final  . Glucose, Bld 11/22/2015 223* 65 - 99 mg/dL Final  . Calcium, Ion 11/22/2015 1.03* 1.12 - 1.23 mmol/L Final  . TCO2 11/22/2015 15  0 - 100 mmol/L Final  . Hemoglobin 11/22/2015 16.3* 12.0 - 15.0 g/dL Final  . HCT 11/22/2015 48.0* 36.0 - 46.0 % Final  . Comment 11/22/2015 NOTIFIED PHYSICIAN   Final  . Troponin I 11/22/2015 0.04* <0.031 ng/mL Final   Comment:        PERSISTENTLY INCREASED TROPONIN VALUES IN THE RANGE OF 0.04-0.49 ng/mL CAN BE SEEN IN:       -UNSTABLE ANGINA       -CONGESTIVE HEART FAILURE       -MYOCARDITIS       -CHEST TRAUMA       -ARRYHTHMIAS       -LATE PRESENTING MYOCARDIAL INFARCTION       -COPD   CLINICAL FOLLOW-UP RECOMMENDED.   Marland Kitchen  WBC 11/22/2015 3.4* 4.0 - 10.5 K/uL Final  . RBC 11/22/2015 4.85  3.87 - 5.11 MIL/uL Final  . Hemoglobin 11/22/2015 13.6  12.0 - 15.0 g/dL Final  . HCT 11/22/2015 41.5  36.0 - 46.0 % Final  . MCV 11/22/2015 85.6  78.0 -  100.0 fL Final  . MCH 11/22/2015 28.0  26.0 - 34.0 pg Final  . MCHC 11/22/2015 32.8  30.0 - 36.0 g/dL Final  . RDW 11/22/2015 14.6  11.5 - 15.5 % Final  . Platelets 11/22/2015 65* 150 - 400 K/uL Final   PLATELET COUNT CONFIRMED BY SMEAR  . Sodium 11/22/2015 129* 135 - 145 mmol/L Final  . Potassium 11/22/2015 6.6* 3.5 - 5.1 mmol/L Final   Comment: CRITICAL RESULT CALLED TO, READ BACK BY AND VERIFIED WITH: S.FLACK,RN 0805 11/22/15 CLARK,S   . Chloride 11/22/2015 91* 101 - 111 mmol/L Final  . CO2 11/22/2015 14* 22 - 32 mmol/L Final  . Glucose, Bld 11/22/2015 202* 65 - 99 mg/dL Final  . BUN 11/22/2015 118* 6 - 20 mg/dL Final  . Creatinine, Ser 11/22/2015 10.58* 0.44 - 1.00 mg/dL Final  . Calcium 11/22/2015 8.8* 8.9 - 10.3 mg/dL Final  . Total Protein 11/22/2015 6.9  6.5 - 8.1 g/dL Final  . Albumin 11/22/2015 3.3* 3.5 - 5.0 g/dL Final  . AST 11/22/2015 27  15 - 41 U/L Final  . ALT 11/22/2015 32  14 - 54 U/L Final  . Alkaline Phosphatase 11/22/2015 175* 38 - 126 U/L Final  . Total Bilirubin 11/22/2015 0.8  0.3 - 1.2 mg/dL Final  . GFR calc non Af Amer 11/22/2015 4* >60 mL/min Final  . GFR calc Af Amer 11/22/2015 4* >60 mL/min Final   Comment: (NOTE) The eGFR has been calculated using the CKD EPI equation. This calculation has not been validated in all clinical situations. eGFR's persistently <60 mL/min signify possible Chronic Kidney Disease.   . Anion gap 11/22/2015 24* 5 - 15 Final  . Opiates 11/22/2015 NONE DETECTED  NONE DETECTED Final  . Cocaine 11/22/2015 NONE DETECTED  NONE DETECTED Final  . Benzodiazepines 11/22/2015 NONE DETECTED  NONE DETECTED Final  . Amphetamines 11/22/2015 NONE DETECTED  NONE DETECTED Final  . Tetrahydrocannabinol 11/22/2015 NONE DETECTED  NONE DETECTED Final  . Barbiturates 11/22/2015 NONE DETECTED  NONE DETECTED Final   Comment:        DRUG SCREEN FOR MEDICAL PURPOSES ONLY.  IF CONFIRMATION IS NEEDED FOR ANY PURPOSE, NOTIFY LAB WITHIN 5 DAYS.          LOWEST DETECTABLE LIMITS FOR URINE DRUG SCREEN Drug Class       Cutoff (ng/mL) Amphetamine      1000 Barbiturate      200 Benzodiazepine   093 Tricyclics       818 Opiates          300 Cocaine          300 THC              50   . Lipase 11/22/2015 54* 11 - 51 U/L Final  . Sodium 11/22/2015 128* 135 - 145 mmol/L Final  . Potassium 11/22/2015 7.0* 3.5 - 5.1 mmol/L Final   Comment: NO VISIBLE HEMOLYSIS CRITICAL RESULT CALLED TO, READ BACK BY AND VERIFIED WITH: D.MAGODO,RN [id number] 11/22/15 CLARK,S   . Chloride 11/22/2015 95* 101 - 111 mmol/L Final  . CO2 11/22/2015 12* 22 - 32 mmol/L Final  . Glucose, Bld 11/22/2015 284* 65 - 99 mg/dL Final  .  BUN 11/22/2015 135* 6 - 20 mg/dL Final  . Creatinine, Ser 11/22/2015 11.58* 0.44 - 1.00 mg/dL Final  . Calcium 11/22/2015 9.0  8.9 - 10.3 mg/dL Final  . GFR calc non Af Amer 11/22/2015 3* >60 mL/min Final  . GFR calc Af Amer 11/22/2015 4* >60 mL/min Final   Comment: (NOTE) The eGFR has been calculated using the CKD EPI equation. This calculation has not been validated in all clinical situations. eGFR's persistently <60 mL/min signify possible Chronic Kidney Disease.   . Anion gap 11/22/2015 21* 5 - 15 Final  . Potassium 11/22/2015 3.6  3.5 - 5.1 mmol/L Final  Nursing Home on 11/10/2015  Component Date Value Ref Range Status  . TSH 09/28/2015 2.30  .41 - 5.90 uIU/mL Final  Abstract on 11/01/2015  Component Date Value Ref Range Status  . HM Colonoscopy 06/22/2013 Dr. Watt Climes: no GI bleeds   Final  . TSH 09/28/2012 2.30  0.41 - 5.90 uIU/mL Final    Dg Chest Port 1 View  12/25/2015  CLINICAL DATA:  Missed dialysis.  Shortness of breath. EXAM: PORTABLE CHEST 1 VIEW COMPARISON:  11/22/2015. FINDINGS: Cardiomegaly with diffuse bilateral pulmonary infiltrates consistent pulmonary edema. No prominent pleural effusion. No pneumothorax. Pneumothorax. IMPRESSION: Congestive heart failure with bilateral pulmonary edema. Electronically Signed   By: Marcello Moores   Register   On: 12/25/2015 08:10     Assessment/Plan   ICD-9-CM ICD-10-CM   1. Physical deconditioning - improved 799.3 R53.81   2. Chronic right elbow pain - unchanged 719.42 M25.521    338.29 G89.29   3. Diabetes mellitus with stage 5 chronic kidney disease (HCC) 250.40 E11.22    585.5 N18.5   4. Schizoaffective disorder, bipolar type ([place]) - stable 295.70 F25.0   5. ESRD (end stage renal disease) on dialysis ([place]) 585.6 N18.6    V45.11 Z99.2   6. Chronic pain syndrome 338.4 G89.4   7. Dyslipidemia associated with type 2 diabetes mellitus (HCC) 250.80 E11.69    272.4 E78.5   8. Depression - improved 311 F32.9   9. Chronic combined systolic and diastolic CHF (congestive heart failure) (HCC) 428.42 I50.42    428.0    10. Seizure disorder ([place]) 345.90 G40.909   11. Anemia, iron deficiency 280.9 D50.9    Refer to pain management  Patient is being discharged with home health services:  PT/OT  Patient is being discharged with the following durable medical equipment: None   Patient has been advised to f/u with their PCP in 1-2 weeks to bring them up to date on their rehab stay.  She has an appt on 6/26th with Dr Justin Mend. They were provided with a 30 day supply of scripts for prescription medications and refills must be obtained from their PCP.  TIME SPENT (MINUTES): [place]. Perlie Gold  [facility] [address] [place], [place] [address] [phone] Cell (Monday-Friday 8 AM - 5 PM) [phone] After 5 PM and follow prompts

## 2016-01-09 IMAGING — DX DG CHEST 2V
2 series · 2 of 2 positions shown · non-contrast
Comparison: None.

CLINICAL DATA: Anemia.  Decreased hemoglobin.

EXAM:
CHEST  2 VIEW

[chest pa]
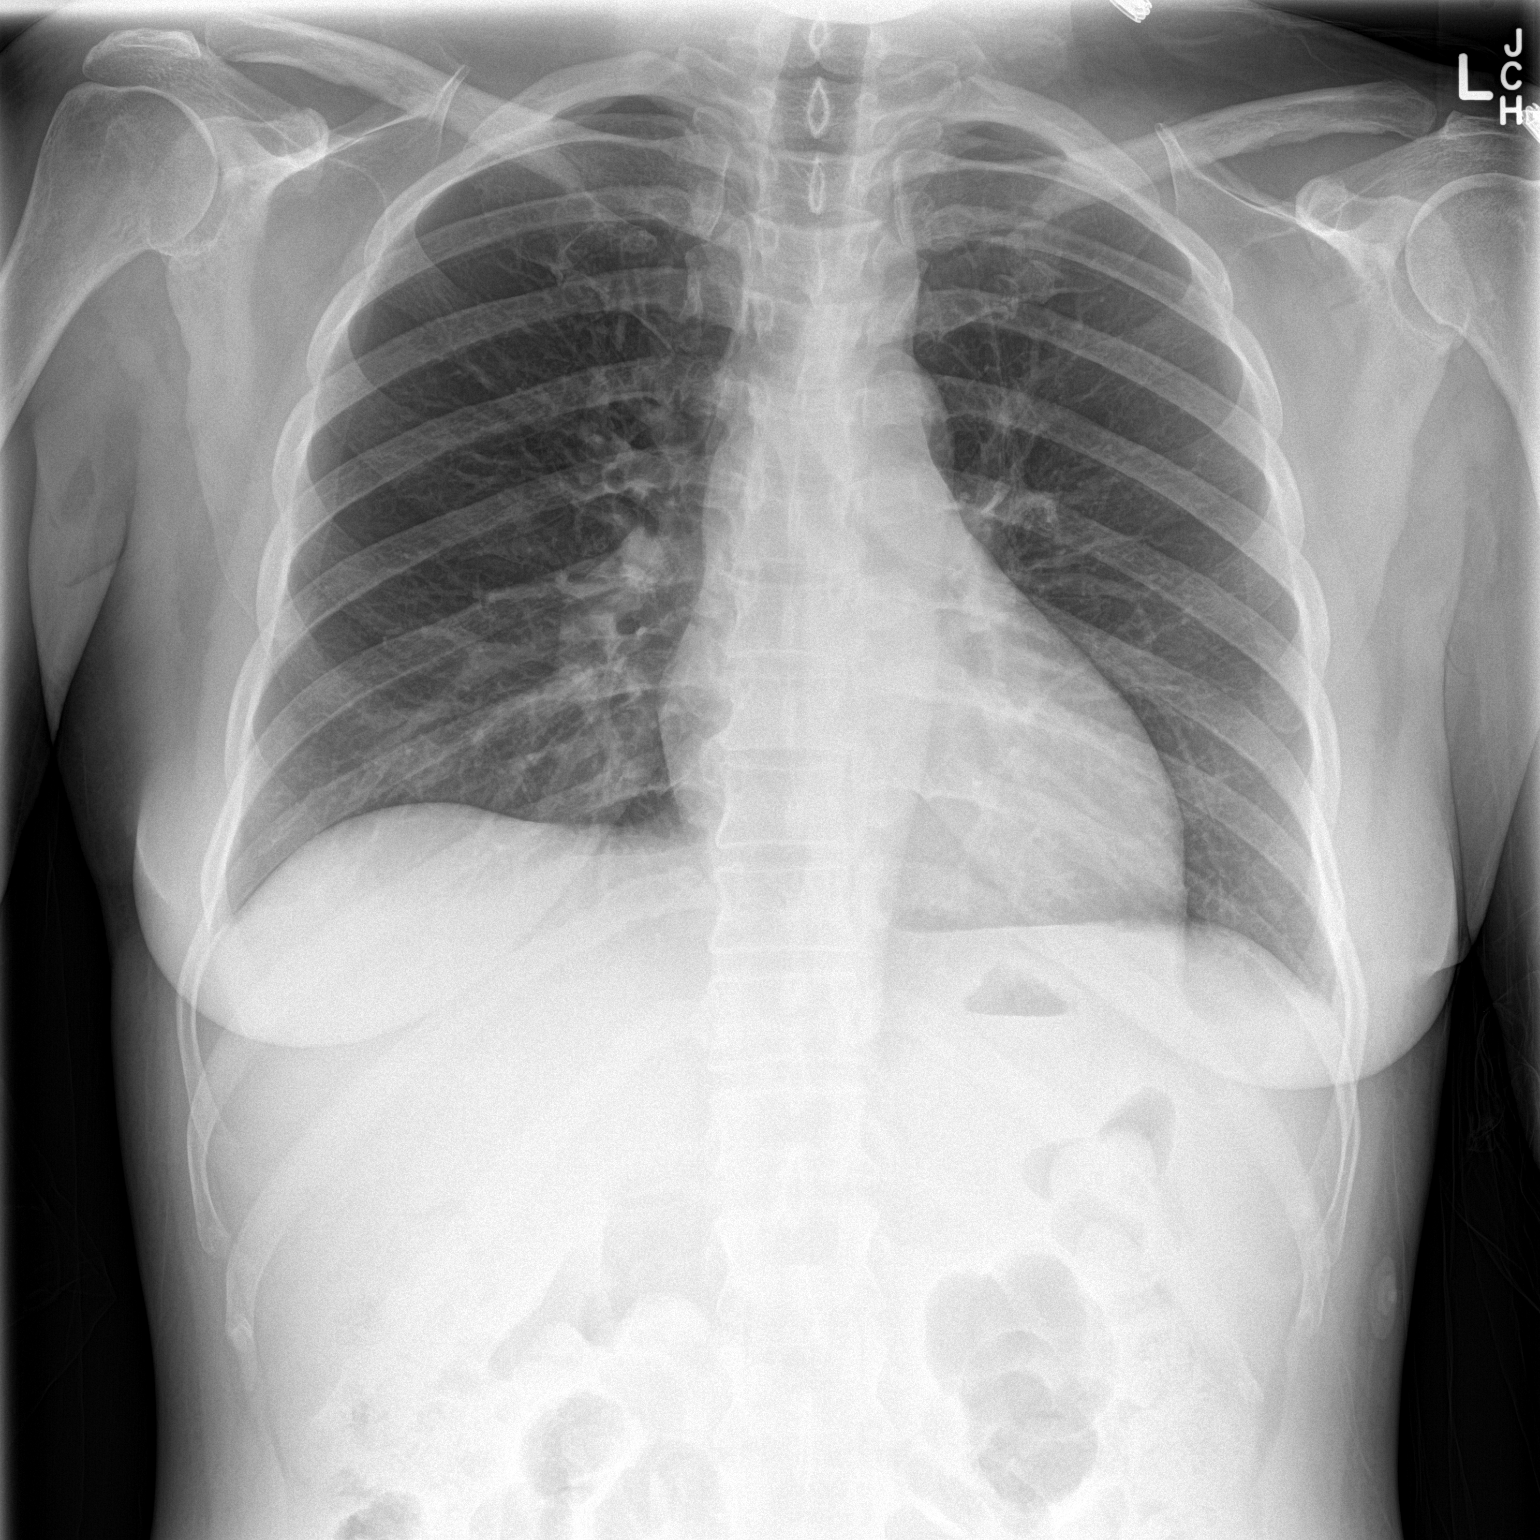

[chest lat]
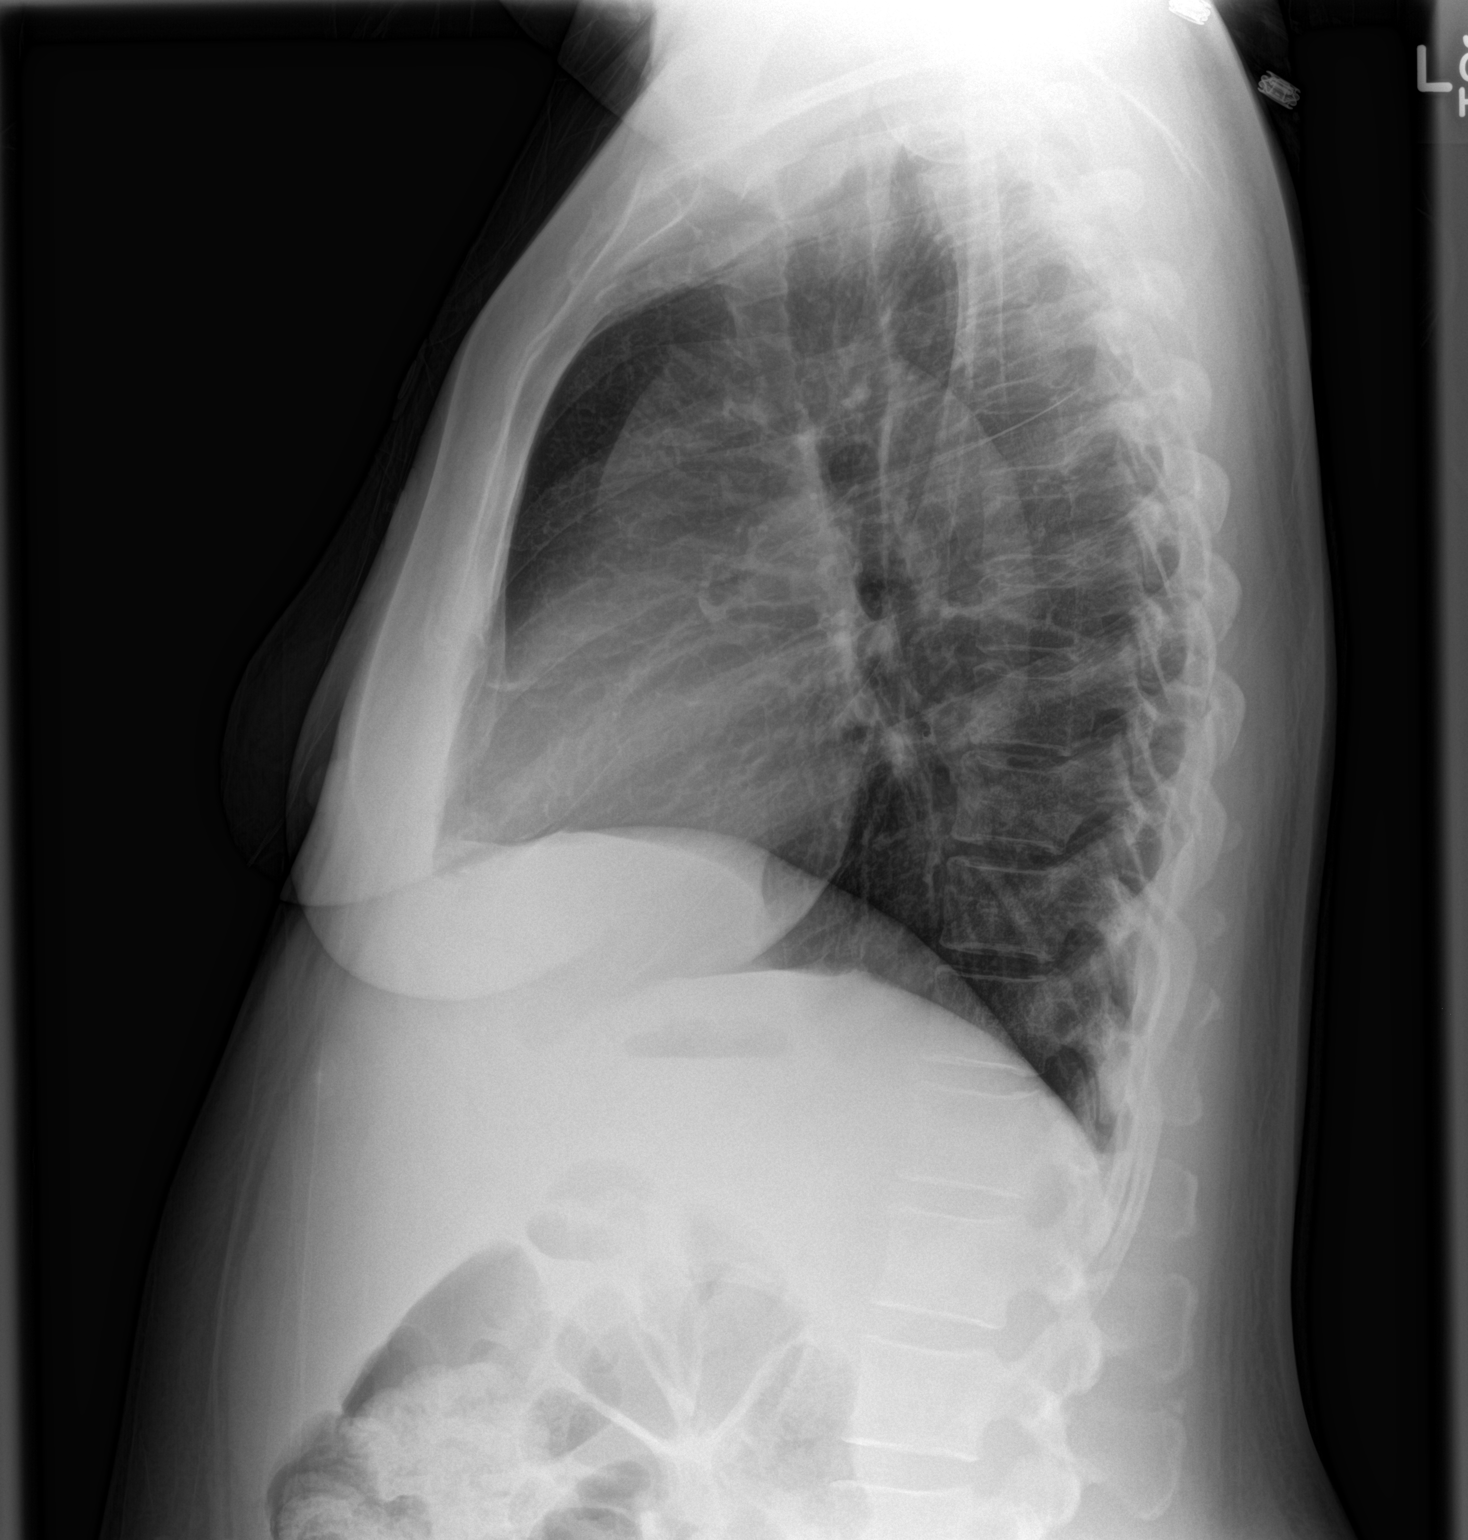

[2 of 2 positions shown; findings below may reference images not displayed]

FINDINGS: Cardiopericardial silhouette within normal limits. Mediastinal
contours normal. Trachea midline. No airspace disease or effusion.
IMPRESSION: No active cardiopulmonary disease.

## 2016-01-13 ENCOUNTER — Emergency Department (HOSPITAL_COMMUNITY): Payer: Medicare Other

## 2016-01-13 ENCOUNTER — Inpatient Hospital Stay (HOSPITAL_COMMUNITY)
Admission: EM | Admit: 2016-01-13 | Discharge: 2016-01-17 | DRG: 177 | Disposition: A | Discharge status: Home / Self Care | Payer: Medicare Other | Attending: Internal Medicine | Admitting: Internal Medicine

## 2016-01-13 ENCOUNTER — Encounter (HOSPITAL_COMMUNITY): Payer: Self-pay

## 2016-01-13 DIAGNOSIS — F1910 Other psychoactive substance abuse, uncomplicated: Secondary | ICD-10-CM | POA: Diagnosis present

## 2016-01-13 DIAGNOSIS — E1122 Type 2 diabetes mellitus with diabetic chronic kidney disease: Secondary | ICD-10-CM | POA: Diagnosis present

## 2016-01-13 DIAGNOSIS — I5042 Chronic combined systolic (congestive) and diastolic (congestive) heart failure: Secondary | ICD-10-CM | POA: Diagnosis present

## 2016-01-13 DIAGNOSIS — R0602 Shortness of breath: Secondary | ICD-10-CM | POA: Diagnosis present

## 2016-01-13 DIAGNOSIS — J810 Acute pulmonary edema: Secondary | ICD-10-CM | POA: Diagnosis not present

## 2016-01-13 DIAGNOSIS — Z794 Long term (current) use of insulin: Secondary | ICD-10-CM | POA: Diagnosis not present

## 2016-01-13 DIAGNOSIS — G40909 Epilepsy, unspecified, not intractable, without status epilepticus: Secondary | ICD-10-CM | POA: Diagnosis present

## 2016-01-13 DIAGNOSIS — H409 Unspecified glaucoma: Secondary | ICD-10-CM | POA: Diagnosis present

## 2016-01-13 DIAGNOSIS — F319 Bipolar disorder, unspecified: Secondary | ICD-10-CM | POA: Diagnosis present

## 2016-01-13 DIAGNOSIS — J189 Pneumonia, unspecified organism: Secondary | ICD-10-CM | POA: Diagnosis not present

## 2016-01-13 DIAGNOSIS — K219 Gastro-esophageal reflux disease without esophagitis: Secondary | ICD-10-CM | POA: Diagnosis present

## 2016-01-13 DIAGNOSIS — R042 Hemoptysis: Secondary | ICD-10-CM | POA: Diagnosis present

## 2016-01-13 DIAGNOSIS — N19 Unspecified kidney failure: Secondary | ICD-10-CM

## 2016-01-13 DIAGNOSIS — G894 Chronic pain syndrome: Secondary | ICD-10-CM | POA: Diagnosis present

## 2016-01-13 DIAGNOSIS — E785 Hyperlipidemia, unspecified: Secondary | ICD-10-CM | POA: Diagnosis present

## 2016-01-13 DIAGNOSIS — J811 Chronic pulmonary edema: Secondary | ICD-10-CM

## 2016-01-13 DIAGNOSIS — F259 Schizoaffective disorder, unspecified: Secondary | ICD-10-CM | POA: Diagnosis present

## 2016-01-13 DIAGNOSIS — Z91199 Patient's noncompliance with other medical treatment and regimen due to unspecified reason: Secondary | ICD-10-CM

## 2016-01-13 DIAGNOSIS — Z9114 Patient's other noncompliance with medication regimen: Secondary | ICD-10-CM

## 2016-01-13 DIAGNOSIS — S42411A Displaced simple supracondylar fracture without intercondylar fracture of right humerus, initial encounter for closed fracture: Secondary | ICD-10-CM | POA: Diagnosis present

## 2016-01-13 DIAGNOSIS — J690 Pneumonitis due to inhalation of food and vomit: Principal | ICD-10-CM | POA: Diagnosis present

## 2016-01-13 DIAGNOSIS — Z992 Dependence on renal dialysis: Secondary | ICD-10-CM

## 2016-01-13 DIAGNOSIS — B1920 Unspecified viral hepatitis C without hepatic coma: Secondary | ICD-10-CM | POA: Diagnosis present

## 2016-01-13 DIAGNOSIS — Z9119 Patient's noncompliance with other medical treatment and regimen: Secondary | ICD-10-CM | POA: Diagnosis not present

## 2016-01-13 DIAGNOSIS — N2581 Secondary hyperparathyroidism of renal origin: Secondary | ICD-10-CM | POA: Diagnosis present

## 2016-01-13 DIAGNOSIS — K5909 Other constipation: Secondary | ICD-10-CM | POA: Diagnosis not present

## 2016-01-13 DIAGNOSIS — I132 Hypertensive heart and chronic kidney disease with heart failure and with stage 5 chronic kidney disease, or end stage renal disease: Secondary | ICD-10-CM | POA: Diagnosis present

## 2016-01-13 DIAGNOSIS — Z8673 Personal history of transient ischemic attack (TIA), and cerebral infarction without residual deficits: Secondary | ICD-10-CM

## 2016-01-13 DIAGNOSIS — E1140 Type 2 diabetes mellitus with diabetic neuropathy, unspecified: Secondary | ICD-10-CM | POA: Diagnosis present

## 2016-01-13 DIAGNOSIS — E875 Hyperkalemia: Secondary | ICD-10-CM

## 2016-01-13 DIAGNOSIS — N186 End stage renal disease: Secondary | ICD-10-CM | POA: Diagnosis present

## 2016-01-13 DIAGNOSIS — E119 Type 2 diabetes mellitus without complications: Secondary | ICD-10-CM

## 2016-01-13 DIAGNOSIS — Z87891 Personal history of nicotine dependence: Secondary | ICD-10-CM

## 2016-01-13 DIAGNOSIS — N185 Chronic kidney disease, stage 5: Secondary | ICD-10-CM

## 2016-01-13 DIAGNOSIS — S42201P Unspecified fracture of upper end of right humerus, subsequent encounter for fracture with malunion: Secondary | ICD-10-CM | POA: Diagnosis not present

## 2016-01-13 DIAGNOSIS — I509 Heart failure, unspecified: Secondary | ICD-10-CM | POA: Diagnosis not present

## 2016-01-13 LAB — BASIC METABOLIC PANEL
Anion gap: 17 — ABNORMAL HIGH (ref 5–15)
BUN: 119 mg/dL — AB (ref 6–20)
CHLORIDE: 95 mmol/L — AB (ref 101–111)
CO2: 22 mmol/L (ref 22–32)
Calcium: 8.2 mg/dL — ABNORMAL LOW (ref 8.9–10.3)
Creatinine, Ser: 14.72 mg/dL — ABNORMAL HIGH (ref 0.44–1.00)
GFR calc non Af Amer: 3 mL/min — ABNORMAL LOW (ref >60)
GFR, EST AFRICAN AMERICAN: 3 mL/min — AB (ref >60)
GLUCOSE: 230 mg/dL — AB (ref 65–99)
POTASSIUM: 5.4 mmol/L — AB (ref 3.5–5.1)
Sodium: 134 mmol/L — ABNORMAL LOW (ref 135–145)

## 2016-01-13 LAB — CBC
HEMATOCRIT: 26.5 % — AB (ref 36.0–46.0)
Hemoglobin: 8.4 g/dL — ABNORMAL LOW (ref 12.0–15.0)
MCH: 26.9 pg (ref 26.0–34.0)
MCHC: 31.7 g/dL (ref 30.0–36.0)
MCV: 84.9 fL (ref 78.0–100.0)
Platelets: 99 10*3/uL — ABNORMAL LOW (ref 150–400)
RBC: 3.12 MIL/uL — ABNORMAL LOW (ref 3.87–5.11)
RDW: 18.7 % — AB (ref 11.5–15.5)
WBC: 2.2 10*3/uL — AB (ref 4.0–10.5)

## 2016-01-13 LAB — I-STAT TROPONIN, ED: Troponin i, poc: 0 ng/mL (ref 0.00–0.08)

## 2016-01-13 LAB — GLUCOSE, CAPILLARY: Glucose-Capillary: 71 mg/dL (ref 65–99)

## 2016-01-13 MED ORDER — POLYETHYLENE GLYCOL 3350 17 G PO PACK
17.0000 g | PACK | Freq: Every day | ORAL | Status: DC
  Administered 2016-01-14 – 2016-01-17 (×3): 17 g via ORAL

## 2016-01-13 MED ORDER — POLYETHYLENE GLYCOL 3350 17 G PO PACK
17.0000 g | PACK | Freq: Every day | ORAL | Status: DC
  Administered 2016-01-13 – 2016-01-16 (×2): 17 g via ORAL
  Filled 2016-01-13 (×5): qty 1

## 2016-01-13 MED ORDER — LEVETIRACETAM 500 MG PO TABS
500.0000 mg | ORAL_TABLET | Freq: Two times a day (BID) | ORAL | Status: DC
  Filled 2016-01-13: qty 1

## 2016-01-13 MED ORDER — ACETAMINOPHEN 650 MG RE SUPP: 650.0000 mg | Freq: Four times a day (QID) | RECTAL | Status: DC | PRN

## 2016-01-13 MED ORDER — ALBUTEROL SULFATE (2.5 MG/3ML) 0.083% IN NEBU
2.5000 mg | INHALATION_SOLUTION | RESPIRATORY_TRACT | Status: DC | PRN
  Administered 2016-01-14: 2.5 mg via RESPIRATORY_TRACT

## 2016-01-13 MED ORDER — VANCOMYCIN HCL IN DEXTROSE 1-5 GM/200ML-% IV SOLN: 1000.0000 mg | Freq: Once | INTRAVENOUS | Status: DC

## 2016-01-13 MED ORDER — CARVEDILOL 3.125 MG PO TABS
3.1250 mg | ORAL_TABLET | Freq: Two times a day (BID) | ORAL | Status: DC
  Administered 2016-01-14 – 2016-01-17 (×5): 3.125 mg via ORAL
  Filled 2016-01-13 (×6): qty 1

## 2016-01-13 MED ORDER — B-12 2500 MCG PO TABS: 1.0000 | ORAL_TABLET | Freq: Every day | ORAL | Status: DC

## 2016-01-13 MED ORDER — VANCOMYCIN HCL IN DEXTROSE 750-5 MG/150ML-% IV SOLN: 750.0000 mg | INTRAVENOUS | Status: DC

## 2016-01-13 MED ORDER — DIPHENHYDRAMINE HCL 25 MG PO CAPS: 25.0000 mg | ORAL_CAPSULE | Freq: Four times a day (QID) | ORAL | Status: DC | PRN

## 2016-01-13 MED ORDER — DEXTROSE 5 % IV SOLN
1.0000 g | Freq: Every day | INTRAVENOUS | Status: DC
  Administered 2016-01-14: 1 g via INTRAVENOUS
  Filled 2016-01-13 (×2): qty 1

## 2016-01-13 MED ORDER — CLONIDINE HCL 0.1 MG PO TABS
0.1000 mg | ORAL_TABLET | Freq: Three times a day (TID) | ORAL | Status: DC
  Administered 2016-01-14 – 2016-01-16 (×8): 0.1 mg via ORAL
  Filled 2016-01-13 (×9): qty 1

## 2016-01-13 MED ORDER — ALBUTEROL SULFATE (2.5 MG/3ML) 0.083% IN NEBU: 2.5000 mg | INHALATION_SOLUTION | Freq: Four times a day (QID) | RESPIRATORY_TRACT | Status: DC

## 2016-01-13 MED ORDER — ACETAMINOPHEN 325 MG PO TABS: 650.0000 mg | ORAL_TABLET | Freq: Four times a day (QID) | ORAL | Status: DC | PRN

## 2016-01-13 MED ORDER — OXYCODONE HCL 5 MG PO TABS
10.0000 mg | ORAL_TABLET | Freq: Four times a day (QID) | ORAL | Status: DC | PRN
  Administered 2016-01-14 – 2016-01-17 (×7): 10 mg via ORAL
  Filled 2016-01-13 (×6): qty 2

## 2016-01-13 MED ORDER — MORPHINE SULFATE (PF) 2 MG/ML IV SOLN
2.0000 mg | INTRAVENOUS | Status: DC | PRN
Start: 2016-01-13 — End: 2016-01-17
  Filled 2016-01-13: qty 1

## 2016-01-13 MED ORDER — DEXTROSE 5 % IV SOLN: 2.0000 g | Freq: Once | INTRAVENOUS | Status: DC

## 2016-01-13 MED ORDER — MELATONIN 5 MG PO TABS: 1.0000 | ORAL_TABLET | Freq: Every day | ORAL | Status: DC

## 2016-01-13 MED ORDER — ZOLPIDEM TARTRATE 5 MG PO TABS: 5.0000 mg | ORAL_TABLET | Freq: Every evening | ORAL | Status: DC | PRN

## 2016-01-13 MED ORDER — SODIUM CHLORIDE 0.9% FLUSH
3.0000 mL | INTRAVENOUS | Status: DC | PRN
  Administered 2016-01-15: 3 mL via INTRAVENOUS
  Filled 2016-01-13: qty 3

## 2016-01-13 MED ORDER — INSULIN ASPART 100 UNIT/ML ~~LOC~~ SOLN
5.0000 [IU] | Freq: Once | SUBCUTANEOUS | Status: AC
  Administered 2016-01-13: 5 [IU] via SUBCUTANEOUS
  Filled 2016-01-13 (×2): qty 1

## 2016-01-13 MED ORDER — ADULT MULTIVITAMIN W/MINERALS CH
1.0000 | ORAL_TABLET | Freq: Every day | ORAL | Status: DC
  Administered 2016-01-14 – 2016-01-17 (×4): 1 via ORAL
  Filled 2016-01-13 (×4): qty 1

## 2016-01-13 MED ORDER — SEVELAMER CARBONATE 800 MG PO TABS
2400.0000 mg | ORAL_TABLET | Freq: Three times a day (TID) | ORAL | Status: DC
  Administered 2016-01-14 – 2016-01-15 (×4): 2400 mg via ORAL
  Administered 2016-01-16 (×3): 800 mg via ORAL
  Administered 2016-01-17: 1600 mg via ORAL
  Filled 2016-01-13 (×8): qty 3

## 2016-01-13 MED ORDER — PRO-STAT SUGAR FREE PO LIQD
30.0000 mL | Freq: Two times a day (BID) | ORAL | Status: DC
Start: 2016-01-13 — End: 2016-01-13

## 2016-01-13 MED ORDER — INSULIN ASPART 100 UNIT/ML ~~LOC~~ SOLN
0.0000 [IU] | Freq: Every day | SUBCUTANEOUS | Status: DC
  Administered 2016-01-16: 2 [IU] via SUBCUTANEOUS

## 2016-01-13 MED ORDER — AMLODIPINE BESYLATE 10 MG PO TABS
10.0000 mg | ORAL_TABLET | Freq: Every day | ORAL | Status: DC
  Administered 2016-01-14 – 2016-01-16 (×3): 10 mg via ORAL
  Filled 2016-01-13 (×4): qty 1

## 2016-01-13 MED ORDER — OXYCODONE HCL 5 MG PO TABS: 5.0000 mg | ORAL_TABLET | ORAL | Status: DC | PRN

## 2016-01-13 MED ORDER — DEXTROSE 5 % IV SOLN
1.0000 g | Freq: Once | INTRAVENOUS | Status: DC
  Filled 2016-01-13: qty 1

## 2016-01-13 MED ORDER — SODIUM CHLORIDE 0.9 % IV SOLN: 250.0000 mL | INTRAVENOUS | Status: DC | PRN

## 2016-01-13 MED ORDER — NIACIN 500 MG PO TABS: 500.0000 mg | ORAL_TABLET | Freq: Every day | ORAL | Status: DC

## 2016-01-13 MED ORDER — ATORVASTATIN CALCIUM 40 MG PO TABS
40.0000 mg | ORAL_TABLET | Freq: Every day | ORAL | Status: DC
Start: 2016-01-14 — End: 2016-01-17
  Administered 2016-01-14 – 2016-01-16 (×3): 40 mg via ORAL
  Filled 2016-01-13 (×4): qty 1

## 2016-01-13 MED ORDER — COQ10 100 MG PO CAPS: 1.0000 | ORAL_CAPSULE | Freq: Every day | ORAL | Status: DC

## 2016-01-13 MED ORDER — INSULIN ASPART 100 UNIT/ML ~~LOC~~ SOLN
0.0000 [IU] | Freq: Three times a day (TID) | SUBCUTANEOUS | Status: DC
  Administered 2016-01-14: 5 [IU] via SUBCUTANEOUS
  Administered 2016-01-15: 3 [IU] via SUBCUTANEOUS
  Administered 2016-01-15: 5 [IU] via SUBCUTANEOUS
  Administered 2016-01-16: 3 [IU] via SUBCUTANEOUS
  Administered 2016-01-16 (×2): 5 [IU] via SUBCUTANEOUS
  Administered 2016-01-17: 3 [IU] via SUBCUTANEOUS

## 2016-01-13 MED ORDER — DOCUSATE SODIUM 100 MG PO CAPS: 100.0000 mg | ORAL_CAPSULE | Freq: Two times a day (BID) | ORAL | Status: DC

## 2016-01-13 MED ORDER — PROMETHAZINE HCL 25 MG PO TABS: 12.5000 mg | ORAL_TABLET | Freq: Four times a day (QID) | ORAL | Status: DC | PRN

## 2016-01-13 MED ORDER — INSULIN GLARGINE 100 UNIT/ML ~~LOC~~ SOLN
12.0000 [IU] | Freq: Every day | SUBCUTANEOUS | Status: DC
  Filled 2016-01-13 (×2): qty 0.12

## 2016-01-13 MED ORDER — TRAZODONE HCL 50 MG PO TABS: 50.0000 mg | ORAL_TABLET | Freq: Every day | ORAL | Status: DC

## 2016-01-13 MED ORDER — HEPARIN SODIUM (PORCINE) 5000 UNIT/ML IJ SOLN
5000.0000 [IU] | Freq: Two times a day (BID) | INTRAMUSCULAR | Status: DC
Start: 2016-01-14 — End: 2016-01-17
  Administered 2016-01-14 – 2016-01-17 (×7): 5000 [IU] via SUBCUTANEOUS
  Filled 2016-01-13 (×7): qty 1

## 2016-01-13 MED ORDER — LORAZEPAM 0.5 MG PO TABS
0.5000 mg | ORAL_TABLET | Freq: Four times a day (QID) | ORAL | Status: DC | PRN
  Administered 2016-01-15 – 2016-01-17 (×4): 0.5 mg via ORAL
  Filled 2016-01-13 (×4): qty 1

## 2016-01-13 MED ORDER — FLEET ENEMA 7-19 GM/118ML RE ENEM
1.0000 | ENEMA | Freq: Once | RECTAL | Status: DC | PRN
  Filled 2016-01-13 (×2): qty 1

## 2016-01-13 MED ORDER — POLYVINYL ALCOHOL 1.4 % OP SOLN
2.0000 [drp] | Freq: Four times a day (QID) | OPHTHALMIC | Status: DC | PRN
  Filled 2016-01-13: qty 15

## 2016-01-13 MED ORDER — SODIUM CHLORIDE 0.9 % IV SOLN
1500.0000 mg | Freq: Once | INTRAVENOUS | Status: DC
  Filled 2016-01-13: qty 1500

## 2016-01-13 MED ORDER — SODIUM CHLORIDE 0.9% FLUSH
3.0000 mL | Freq: Two times a day (BID) | INTRAVENOUS | Status: DC
  Administered 2016-01-14 – 2016-01-17 (×6): 3 mL via INTRAVENOUS

## 2016-01-13 MED ORDER — AZTREONAM 1 G IJ SOLR
1.0000 g | INTRAMUSCULAR | Status: DC
Start: 2016-01-14 — End: 2016-01-13

## 2016-01-13 NOTE — H&P (Signed)
[facility]                                                                                    Patient Demographics  [name], is a 50 y.o. female  CSN: [id number]  MRN: [id number]  DOB - [birth year]  Admit Date - 01/13/2016  Outpatient Primary MD for the patient is WEBB, Valla Leaver, MD   With History of -  Past Medical History  Diagnosis Date  . Neuropathy ([place])   . Glaucoma   . Bipolar 1 disorder ([place])   . Refusal of blood transfusions as patient is Jehovah's Witness   . TIA (transient ischemic attack) 2010  . MRSA infection ?2014    "on the back of my head; spread throughout my bloodstream"  . Bipolar disorder, unspecified ([place]) 02/06/2014  . High cholesterol   . 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 Hepatitis C   . Chronic pain syndrome   . ESRD on hemodialysis ([place])   . Schizoaffective disorder, unspecified condition 02/08/2014  . Seizure [facility]) dx'd 2014    "don't know what kind; last one was ~ 04/2014"  . Chronic combined systolic and diastolic CHF (congestive heart failure) (HCC)     EF 40% by echo and 48% by stress test  . Anemia   . Hypertension   . Depression   . Anxiety   . GERD (gastroesophageal reflux disease)   . History of hiatal hernia   . Headache     sinus headaches  . History of vitamin D deficiency 06/20/2015  . Stroke [facility])     TIA 2009      Past Surgical History  Procedure Laterality Date  . Amputation Left 05/21/2013    Procedure: Left Midfoot AMPUTATION;  Surgeon: Newt Minion, MD;  Location: [place];  Service: Orthopedics;  Laterality: Left;  Left Midfoot Amputation  . Tubal ligation    . Colonoscopy with propofol N/A 06/22/2013    Procedure: COLONOSCOPY WITH PROPOFOL;  Surgeon: Jeryl Columbia, MD;  Location: [facility] ENDOSCOPY;  Service: Endoscopy;  Laterality: N/A;  . Abdominal hysterectomy  2014  . Tonsillectomy  1970's  . Refractive surgery Bilateral 2013  . Tubal ligation  2000  . Abdominal hysterectomy   2010  . Foot amputation Left     due diabetic neuropathy, could not feel ulcer on bottom of foot  . Eye surgery Bilateral 2010    Lasik  . Av fistula placement Left 05/04/2015    Procedure: ARTERIOVENOUS (AV) GRAFT INSERTION;  Surgeon: Angelia Mould, MD;  Location: [place];  Service: Vascular;  Laterality: Left;  . Esophagogastroduodenoscopy N/A 05/11/2015    Procedure: ESOPHAGOGASTRODUODENOSCOPY (EGD);  Surgeon: Laurence Spates, MD;  Location: [facility] ENDOSCOPY;  Service: Endoscopy;  Laterality: N/A;    in for   Chief Complaint  Patient presents with  . Shortness of Breath     HPI  [name]  is a 50 y.o. female, With a past medical history significant for end-stage renal disease on hemodialysis, bipolar disorder TIA, hepatitis C and diabetes mellitus, presenting with increasing shortness of breath with episodes of nausea and vomiting.  The patient did not have any dialysis last week and missed 3 sessions that she attributes to lack of transportation. Patient denies any chest pains, fever or chills but reports episodes of nausea and vomiting in the last couple of days and severe constipation. Patient denies any palpitations, headache or loss of consciousness. Her work of breathing has increased and now she has to sleep sitting up at home. In the emergency room right lower lobe patchy infiltrate was suspected and I was called to evaluate for pneumonia probably due to aspiration .    Review of Systems    In addition to the HPI above,  No Fever-chills, No Headache, No changes with Vision or hearing, No problems swallowing food or Liquids, No Abdominal pain, patient reports episodes of nausea and vomiting and severe constipation, No Blood in stool or Urine, No dysuria, No new skin rashes or bruises, No new joints pains-aches,  No new weakness, tingling, numbness in any extremity, No recent weight gain or loss, No polyuria, polydypsia or polyphagia, No significant Mental  Stressors.  A full 10 point Review of Systems was done, except as stated above, all other Review of Systems were negative.   Social History Social History  Substance Use Topics  . Smoking status: Former Smoker -- 1.00 packs/day for 20 years    Types: Cigarettes    Quit date: 03/31/2015  . Smokeless tobacco: Never Used     Comment: "stopped smoking in ~ 2013; restarted last week; stopped again 06/17/2014"  . Alcohol Use: 1.8 oz/week    3 Standard drinks or equivalent per week     Comment: 05/01/15 patient denies alcohol use.     Family History Family History  Problem Relation Age of Onset  . Hypertension Mother   . Hypertension Father      Prior to Admission medications   Medication Sig Start Date End Date Taking? Authorizing Provider  acetaminophen (TYLENOL) 325 MG tablet Take 2 tablets (650 mg total) by mouth every 6 (six) hours as needed for mild pain (or Fever >/= 101). 09/20/15  Yes Delfina Redwood, MD  amLODipine (NORVASC) 10 MG tablet Take 1 tablet (10 mg total) by mouth at bedtime. 09/20/15  Yes Delfina Redwood, MD  atorvastatin (LIPITOR) 40 MG tablet Take 1 tablet (40 mg total) by mouth at bedtime. 05/07/15  Yes Ripudeep Krystal Eaton, MD  carvedilol (COREG) 3.125 MG tablet Take 3.125 mg by mouth 2 (two) times daily with a meal. Hold if HR <60   Yes Historical Provider, MD  cloNIDine (CATAPRES) 0.1 MG tablet Take 0.1 mg by mouth every 8 (eight) hours.    Yes Historical Provider, MD  diphenhydrAMINE (BENADRYL) 25 mg capsule Take 1 capsule (25 mg total) by mouth every 6 (six) hours as needed for itching. 05/07/15  Yes Ripudeep Krystal Eaton, MD  insulin aspart (NOVOLOG) 100 UNIT/ML injection Inject 0-12 Units into the skin 3 (three) times daily before meals. Per sliding scale inject SQ <150=0units, 151-200=2 units, 201-250=4units, 251-300=6units,301-350=8 units, 351-400=10 units, >/= 401=12 units   Yes Historical Provider, MD  insulin glargine (LANTUS) 100 UNIT/ML injection Inject 12 Units  into the skin at bedtime.    Yes Historical Provider, MD  LORazepam (ATIVAN) 0.5 MG tablet Take 0.5 mg by mouth See admin instructions. Take 1 tablet (0.5 mg) by mouth prior to dialysis and at bedtime as needed for sleep   Yes Historical Provider, MD  Oxycodone HCl 10 MG TABS Take 1 tablet (10 mg total) by mouth  every 6 (six) hours as needed (pain). 11/22/15  Yes Estill Dooms, MD  polyethylene glycol (MIRALAX / GLYCOLAX) packet Mix 1 capful (17 GM) in 8 oz of water and drink by mouth twice daily for constipation.   Yes Historical Provider, MD  polyvinyl alcohol (LIQUIFILM TEARS) 1.4 % ophthalmic solution Place 2 drops into both eyes every 6 (six) hours as needed for dry eyes.    Yes Historical Provider, MD  sevelamer carbonate (RENVELA) 800 MG tablet Take 2,400 mg by mouth 3 (three) times daily with meals.    Yes Historical Provider, MD  promethazine (PHENERGAN) 12.5 MG tablet Take 1 tablet (12.5 mg total) by mouth every 6 (six) hours as needed for nausea. Patient not taking: Reported on 01/13/2016 07/06/15   Ozella Almond Ward, PA-C    Allergies  Allergen Reactions  . Gabapentin Itching and Swelling  . Lyrica [Pregabalin] Swelling    Facial and leg swelling  . Other     NO BLOOD PRODUCTS. PT IS A JEHOVAH WITNESS  . Penicillins Hives    Has patient had a PCN reaction causing immediate rash, facial/tongue/throat swelling, SOB or lightheadedness with hypotension: Yes Has patient had a PCN reaction causing severe rash involving mucus membranes or skin necrosis: No Has patient had a PCN reaction that required hospitalization No Has patient had a PCN reaction occurring within the last 10 years: No If all of the above answers are "NO", then may proceed with Cephalosporin use. Pt tolerates cefepime and ceftriaxone  . Saphris [Asenapine] Swelling    Facial swelling  . Tramadol Swelling    Leg swelling    Physical Exam  Vitals  Blood pressure 175/86, pulse 79, temperature 98.6 F (37 C),  temperature source Oral, resp. rate 21, SpO2 96 %.   1. General Chronically ill female, looks tired  2. Normal affect and insight, Not Suicidal or Homicidal, Awake Alert, Oriented X 3.  3. No F.N deficits, grossly   4. Ears and Eyes appear Normal, Conjunctivae clear, PERRLA. Moist Oral Mucosa.  5. Supple Neck, No JVD, No Carotid Bruits.  6. Symmetrical Chest wall movement, bilateral basilar crackles.  7. RRR, No Gallops, Rubs or Murmurs, No Parasternal Heave.  8. Positive Bowel Sounds, Abdomen Soft, Non tender, No organomegaly appriciated,No rebound -guarding or rigidity.  9.  No Cyanosis, Normal Skin Turgor, No Skin Rash or Bruise.  10. Good muscle tone,  joints appear normal , no effusions, Normal ROM.    Data Review  CBC  Recent Labs Lab 01/13/16 1812  WBC 2.2*  HGB 8.4*  HCT 26.5*  PLT 99*  MCV 84.9  MCH 26.9  MCHC 31.7  RDW 18.7*   ------------------------------------------------------------------------------------------------------------------  Chemistries   Recent Labs Lab 01/13/16 1812  NA 134*  K 5.4*  CL 95*  CO2 22  GLUCOSE 230*  BUN 119*  CREATININE 14.72*  CALCIUM 8.2*   ------------------------------------------------------------------------------------------------------------------ estimated creatinine clearance is 4.6 mL/min (by C-G formula based on Cr of 14.72). ------------------------------------------------------------------------------------------------------------------ No results for input(s): TSH, T4TOTAL, T3FREE, THYROIDAB in the last 72 hours.  Invalid input(s): FREET3   Coagulation profile No results for input(s): INR, PROTIME in the last 168 hours. ------------------------------------------------------------------------------------------------------------------- No results for input(s): DDIMER in the last 72  hours. -------------------------------------------------------------------------------------------------------------------  Cardiac Enzymes No results for input(s): CKMB, TROPONINI, MYOGLOBIN in the last 168 hours.  Invalid input(s): CK ------------------------------------------------------------------------------------------------------------------ Invalid input(s): POCBNP   ---------------------------------------------------------------------------------------------------------------  Urinalysis    Component Value Date/Time   COLORURINE YELLOW 09/17/2015 0108   APPEARANCEUR  CLEAR 09/17/2015 0108   LABSPEC 1.013 09/17/2015 0108   PHURINE 8.0 09/17/2015 0108   GLUCOSEU NEGATIVE 09/17/2015 0108   HGBUR SMALL* 09/17/2015 0108   BILIRUBINUR NEGATIVE 09/17/2015 0108   BILIRUBINUR neg 12/13/2014 1127   KETONESUR NEGATIVE 09/17/2015 0108   PROTEINUR >300* 09/17/2015 0108   PROTEINUR >=300 12/13/2014 1127   UROBILINOGEN 0.2 05/01/2015 1252   UROBILINOGEN 0.2 12/13/2014 1127   NITRITE NEGATIVE 09/17/2015 0108   NITRITE neg 12/13/2014 1127   LEUKOCYTESUR NEGATIVE 09/17/2015 0108    ----------------------------------------------------------------------------------------------------------------  Imaging results:   Dg Chest 2 View  01/13/2016  CLINICAL DATA:  50 year old female with shortness of breath and chest pain EXAM: CHEST  2 VIEW COMPARISON:  Chest radiograph dated 12/25/2015 FINDINGS: Two views of the chest demonstrate mild cardiomegaly with increased vascular and interstitial prominence compatible with congestive changes/edema. An area of increased density at the right lung base is concerning for superimposed pneumonia. Clinical correlation is recommended. Trace pleural effusion noted along the right major fissure. There is no pneumothorax. No acute osseous pathology. IMPRESSION: Mild cardiomegaly with cough congestive changes and edema. Right lung base hazy densities concerning  for superimposed pneumonia. Clinical correlation and follow-up is recommended. Electronically Signed   By: Anner Crete M.D.   On: 01/13/2016 20:02   Dg Chest Port 1 View  12/25/2015  CLINICAL DATA:  Missed dialysis.  Shortness of breath. EXAM: PORTABLE CHEST 1 VIEW COMPARISON:  11/22/2015. FINDINGS: Cardiomegaly with diffuse bilateral pulmonary infiltrates consistent pulmonary edema. No prominent pleural effusion. No pneumothorax. Pneumothorax. IMPRESSION: Congestive heart failure with bilateral pulmonary edema. Electronically Signed   By: Marcello Moores  Register   On: 12/25/2015 08:10      Assessment & Plan  1. Right lower lobe infiltrate possibly pneumonia due to aspiration 2. End-stage renal disease noncompliant with treatment missed last 3 sessions of dialysis 3. Mild hyperkalemia 4. Bipolar disorder 5. Diabetes mellitus 6. Glaucoma  Plan  Admit to [facility] IV cefepime Consult nephrology for dialysis in a.m.   DVT Prophylaxis Heparin   AM Labs Ordered, also please review Full Orders  Code Status full  Disposition Plan: Home  Time spent in minutes : 36 minutes  Condition GUARDED   @SIGNATURE @

## 2016-01-13 NOTE — ED Notes (Signed)
O2 applied for comfort and shortness of breath and O2 sats lower than 94%

## 2016-01-13 NOTE — ED Notes (Signed)
Care hand off given by Sherryl Manges, RN

## 2016-01-13 NOTE — ED Notes (Addendum)
Patient came with [facility] today. Complaining of shortness of breath, chest pain, and a headache. Patient complains of ongoing secretions. Patient is a MWF dialysis and has missed the past 3 treatments due to lack of transportation but states she has secured transportation for Mondays treatment. O2 91% on room air, lungs clear bilaterally and vitals stable.

## 2016-01-13 NOTE — Progress Notes (Signed)
Pharmacy Antibiotic Note  [name] is a 50 y.o. female admitted on 01/13/2016 with pneumonia.  Pharmacy has been consulted for Aztreonam and Vancomycin dosing.  HD on MWF  Plan: Vancomycin 1500 mg iv X 1 now Vancomycin 750 mg iv Q HD  Aztreonam 1 gram iv Q 24 hours (after HD on HD days)   Temp (24hrs), Avg:98.6 F (37 C), Min:98.6 F (37 C), Max:98.6 F (37 C)   Recent Labs Lab 01/13/16 1812  WBC 2.2*  CREATININE 14.72*    Estimated Creatinine Clearance: 4.6 mL/min (by C-G formula based on Cr of 14.72).    Allergies  Allergen Reactions  . Gabapentin Itching and Swelling  . Lyrica [Pregabalin] Swelling    Facial and leg swelling  . Other     NO BLOOD PRODUCTS. PT IS A JEHOVAH WITNESS  . Penicillins Hives    Has patient had a PCN reaction causing immediate rash, facial/tongue/throat swelling, SOB or lightheadedness with hypotension: Yes Has patient had a PCN reaction causing severe rash involving mucus membranes or skin necrosis: No Has patient had a PCN reaction that required hospitalization No Has patient had a PCN reaction occurring within the last 10 years: No If all of the above answers are "NO", then may proceed with Cephalosporin use. Pt tolerates cefepime and ceftriaxone  . Saphris [Asenapine] Swelling    Facial swelling  . Tramadol Swelling    Leg swelling     Thank you for allowing pharmacy to be a part of this patient's care. Anette Guarneri, PharmD [phone] 01/13/2016 9:40 PM

## 2016-01-13 NOTE — ED Provider Notes (Signed)
CSN: [id number]     Arrival date & time 01/13/16  1727 History   First MD Initiated Contact with Patient 01/13/16 1836     Chief Complaint  Patient presents with  . Shortness of Breath   HPI Patient came with [facility] today. Complaining of shortness of breath and chest pain. Endorses a cough but no fevers or chills. Patient complains of ongoing secretions. Cough is intermittent but typical of when she has a lot of fluid, as is chest pain. Sob is worse with exertion. Does not radiate. Does have some mild hemoptysis. No new leg pain or asymmetry. Not on ac.   Endorses headache, states typical of when she has high blood pressure. No weakness or numbness unilaterally. No eye pain or visual changes.   Patient is a MWF dialysis and has missed the past 3 treatments due to lack of transportation and being at a nursing home s/p amputation one year ago that was re-injured.    States she has secured transportation for Mondays treatment.   No bm in 4 days. Thinks she is impacted, felt lightheaded while straining today. Noticed bright red blood when she strained. No abdominal pain. Not taking anything for constipation.   Past Medical History  Diagnosis Date  . Neuropathy ([place])   . Glaucoma   . Bipolar 1 disorder ([place])   . Refusal of blood transfusions as patient is Jehovah's Witness   . TIA (transient ischemic attack) 2010  . MRSA infection ?2014    "on the back of my head; spread throughout my bloodstream"  . Bipolar disorder, unspecified ([place]) 02/06/2014  . High cholesterol   . 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 Hepatitis C   . Chronic pain syndrome   . ESRD on hemodialysis ([place])   . Schizoaffective disorder, unspecified condition 02/08/2014  . Seizure [facility]) dx'd 2014    "don't know what kind; last one was ~ 04/2014"  . Chronic combined systolic and diastolic CHF (congestive heart failure) (HCC)     EF 40% by echo and 48% by stress test  . Anemia   . Hypertension   .  Depression   . Anxiety   . GERD (gastroesophageal reflux disease)   . History of hiatal hernia   . Headache     sinus headaches  . History of vitamin D deficiency 06/20/2015  . Stroke [facility])     TIA 2009   Past Surgical History  Procedure Laterality Date  . Amputation Left 05/21/2013    Procedure: Left Midfoot AMPUTATION;  Surgeon: Newt Minion, MD;  Location: [place];  Service: Orthopedics;  Laterality: Left;  Left Midfoot Amputation  . Tubal ligation    . Colonoscopy with propofol N/A 06/22/2013    Procedure: COLONOSCOPY WITH PROPOFOL;  Surgeon: Jeryl Columbia, MD;  Location: [facility];  Service: Endoscopy;  Laterality: N/A;  . Abdominal hysterectomy  2014  . Tonsillectomy  1970's  . Refractive surgery Bilateral 2013  . Tubal ligation  2000  . Abdominal hysterectomy  2010  . Foot amputation Left     due diabetic neuropathy, could not feel ulcer on bottom of foot  . Eye surgery Bilateral 2010    Lasik  . Av fistula placement Left 05/04/2015    Procedure: ARTERIOVENOUS (AV) GRAFT INSERTION;  Surgeon: Angelia Mould, MD;  Location: [place];  Service: Vascular;  Laterality: Left;  . Esophagogastroduodenoscopy N/A 05/11/2015    Procedure: ESOPHAGOGASTRODUODENOSCOPY (EGD);  Surgeon: Laurence Spates,  MD;  Location: [place] [facility];  Service: Endoscopy;  Laterality: N/A;   Family History  Problem Relation Age of Onset  . Hypertension Mother   . Hypertension Father    Social History  Substance Use Topics  . Smoking status: Former Smoker -- 1.00 packs/day for 20 years    Types: Cigarettes    Quit date: 03/31/2015  . Smokeless tobacco: Never Used     Comment: "stopped smoking in ~ 2013; restarted last week; stopped again 06/17/2014"  . Alcohol Use: 1.8 oz/week    3 Standard drinks or equivalent per week     Comment: 05/01/15 patient denies alcohol use.   OB History    Gravida Para Term Preterm AB TAB SAB Ectopic Multiple Living   0 0 0 0 0 0 0 0       Review of Systems   Constitutional: Negative for fever.  Allergic/Immunologic: Negative for immunocompromised state.  All other systems reviewed and are negative.  Allergies  Gabapentin; Lyrica; Other; Penicillins; Saphris; and Tramadol  Home Medications   Prior to Admission medications   Medication Sig Start Date End Date Taking? Authorizing Provider  acetaminophen (TYLENOL) 325 MG tablet Take 2 tablets (650 mg total) by mouth every 6 (six) hours as needed for mild pain (or Fever >/= 101). 09/20/15  Yes Delfina Redwood, MD  amLODipine (NORVASC) 10 MG tablet Take 1 tablet (10 mg total) by mouth at bedtime. 09/20/15  Yes Delfina Redwood, MD  atorvastatin (LIPITOR) 40 MG tablet Take 1 tablet (40 mg total) by mouth at bedtime. 05/07/15  Yes Ripudeep Krystal Eaton, MD  carvedilol (COREG) 3.125 MG tablet Take 3.125 mg by mouth 2 (two) times daily with a meal. Hold if HR <60   Yes Historical Provider, MD  cloNIDine (CATAPRES) 0.1 MG tablet Take 0.1 mg by mouth every 8 (eight) hours.    Yes Historical Provider, MD  diphenhydrAMINE (BENADRYL) 25 mg capsule Take 1 capsule (25 mg total) by mouth every 6 (six) hours as needed for itching. 05/07/15  Yes Ripudeep Krystal Eaton, MD  insulin aspart (NOVOLOG) 100 UNIT/ML injection Inject 0-12 Units into the skin 3 (three) times daily before meals. Per sliding scale inject SQ <150=0units, 151-200=2 units, 201-250=4units, 251-300=6units,301-350=8 units, 351-400=10 units, >/= 401=12 units   Yes Historical Provider, MD  insulin glargine (LANTUS) 100 UNIT/ML injection Inject 12 Units into the skin at bedtime.    Yes Historical Provider, MD  LORazepam (ATIVAN) 0.5 MG tablet Take 0.5 mg by mouth See admin instructions. Take 1 tablet (0.5 mg) by mouth prior to dialysis and at bedtime as needed for sleep   Yes Historical Provider, MD  Oxycodone HCl 10 MG TABS Take 1 tablet (10 mg total) by mouth every 6 (six) hours as needed (pain). 11/22/15  Yes Estill Dooms, MD  polyethylene glycol (MIRALAX /  GLYCOLAX) packet Mix 1 capful (17 GM) in 8 oz of water and drink by mouth twice daily for constipation.   Yes Historical Provider, MD  polyvinyl alcohol (LIQUIFILM TEARS) 1.4 % ophthalmic solution Place 2 drops into both eyes every 6 (six) hours as needed for dry eyes.    Yes Historical Provider, MD  sevelamer carbonate (RENVELA) 800 MG tablet Take 2,400 mg by mouth 3 (three) times daily with meals.    Yes Historical Provider, MD  promethazine (PHENERGAN) 12.5 MG tablet Take 1 tablet (12.5 mg total) by mouth every 6 (six) hours as needed for nausea. Patient not taking: Reported on 01/13/2016 07/06/15  Jaime Pilcher Ward, PA-C   BP 180/92 mmHg  Pulse 80  Temp(Src) 98.6 F (37 C) (Oral)  Resp 13  SpO2 93%  LMP  (LMP Unknown) Physical Exam  Constitutional: She appears well-developed and well-nourished. No distress.  HENT:  Head: Normocephalic and atraumatic.  Eyes: Conjunctivae are normal. Right eye exhibits no discharge. Left eye exhibits no discharge.  Neck: Normal range of motion. Neck supple.  Cardiovascular: Normal rate, regular rhythm, normal heart sounds and intact distal pulses.   No murmur heard. Pulmonary/Chest: Effort normal and breath sounds normal. No respiratory distress. She has no wheezes. She has no rales. She exhibits no tenderness.  On 2L sating 96%  Abdominal: Soft. Bowel sounds are normal. She exhibits no distension and no mass. There is no tenderness. There is no rebound and no guarding.  Genitourinary:  Not impacted, large hemorrhoid, no melena or gross red blood  Musculoskeletal: She exhibits no edema (No lower extremity asymmetry or edema) or tenderness (no calf tenderness).  Neurological: She is alert.  Skin: Skin is warm. No rash noted.  Psychiatric: She has a normal mood and affect.  Nursing note and vitals reviewed.   ED Course  Procedures (including critical care time) Labs Review Labs Reviewed  BASIC METABOLIC PANEL - Abnormal; Notable for the  following:    Sodium 134 (*)    Potassium 5.4 (*)    Chloride 95 (*)    Glucose, Bld 230 (*)    BUN 119 (*)    Creatinine, Ser 14.72 (*)    Calcium 8.2 (*)    GFR calc non Af Amer 3 (*)    GFR calc Af Amer 3 (*)    Anion gap 17 (*)    All other components within normal limits  CBC - Abnormal; Notable for the following:    WBC 2.2 (*)    RBC 3.12 (*)    Hemoglobin 8.4 (*)    HCT 26.5 (*)    RDW 18.7 (*)    Platelets 99 (*)    All other components within normal limits  BASIC METABOLIC PANEL - Abnormal; Notable for the following:    Chloride 97 (*)    CO2 21 (*)    BUN 121 (*)    Creatinine, Ser 15.08 (*)    Calcium 8.5 (*)    GFR calc non Af Amer 2 (*)    GFR calc Af Amer 3 (*)    Anion gap 17 (*)    All other components within normal limits  CBC - Abnormal; Notable for the following:    WBC 2.8 (*)    RBC 3.05 (*)    Hemoglobin 8.2 (*)    HCT 26.1 (*)    RDW 18.9 (*)    Platelets 104 (*)    All other components within normal limits  CULTURE, BLOOD (ROUTINE X 2)  CULTURE, BLOOD (ROUTINE X 2)  CULTURE, EXPECTORATED SPUTUM-ASSESSMENT  GRAM STAIN  GLUCOSE, CAPILLARY  STREP PNEUMONIAE URINARY ANTIGEN  I-STAT TROPOININ, ED    Imaging Review Dg Chest 2 View  01/13/2016  CLINICAL DATA:  50 year old female with shortness of breath and chest pain EXAM: CHEST  2 VIEW COMPARISON:  Chest radiograph dated 12/25/2015 FINDINGS: Two views of the chest demonstrate mild cardiomegaly with increased vascular and interstitial prominence compatible with congestive changes/edema. An area of increased density at the right lung base is concerning for superimposed pneumonia. Clinical correlation is recommended. Trace pleural effusion noted along the right major fissure. There is  no pneumothorax. No acute osseous pathology. IMPRESSION: Mild cardiomegaly with cough congestive changes and edema. Right lung base hazy densities concerning for superimposed pneumonia. Clinical correlation and follow-up  is recommended. Electronically Signed   By: Anner Crete M.D.   On: 01/13/2016 20:02   I have personally reviewed and evaluated these images and lab results as part of my medical decision-making.   EKG Interpretation   Date/Time:  Saturday January 13 2016 17:39:25 EDT Ventricular Rate:  80 PR Interval:  158 QRS Duration: 101 QT Interval:  430 QTC Calculation: 496 R Axis:   102 Text Interpretation:  Sinus rhythm Right axis deviation Consider left  ventricular hypertrophy Borderline prolonged QT interval No significant  change since last tracing Confirmed by [facility] MD, [place] ([id number]) on  01/13/2016 7:23:51 PM      MDM   Final diagnoses:  Healthcare-associated pneumonia  Hyperkalemia   Patient presents with shortness of breath and chest pain typical of her symptoms when she misses dialysis. Patient is mildly hyperkalemic and insulin provided. EKG without hyperkalemic changes so no Ca gluconate administered. Discussed case with nephrology who did not feel patient needed emergent dialysis. Patient however has a mild pneumonia. In the weekend, patient unable to get dialysis until Monday. She is also very constipated so Kayexalate is likely not a good alternative. Discussed case with hospitalist. Will admit for further monitoring and treatment of her volume LOAD and pneumonia. Antibiotics administered. She is in agreement with plan.   Karma Greaser, MD 01/14/16 [id number]  Gareth Morgan, MD 01/14/16 [phone]

## 2016-01-14 ENCOUNTER — Encounter (HOSPITAL_COMMUNITY): Payer: Self-pay | Admitting: *Deleted

## 2016-01-14 ENCOUNTER — Inpatient Hospital Stay (HOSPITAL_COMMUNITY): Payer: Medicare Other

## 2016-01-14 DIAGNOSIS — Z794 Long term (current) use of insulin: Secondary | ICD-10-CM

## 2016-01-14 DIAGNOSIS — Z9119 Patient's noncompliance with other medical treatment and regimen: Secondary | ICD-10-CM

## 2016-01-14 DIAGNOSIS — E119 Type 2 diabetes mellitus without complications: Secondary | ICD-10-CM

## 2016-01-14 DIAGNOSIS — B182 Chronic viral hepatitis C: Secondary | ICD-10-CM

## 2016-01-14 DIAGNOSIS — K5909 Other constipation: Secondary | ICD-10-CM

## 2016-01-14 LAB — CBC
HCT: 26.1 % — ABNORMAL LOW (ref 36.0–46.0)
HEMOGLOBIN: 8.2 g/dL — AB (ref 12.0–15.0)
MCH: 26.9 pg (ref 26.0–34.0)
MCHC: 31.4 g/dL (ref 30.0–36.0)
MCV: 85.6 fL (ref 78.0–100.0)
Platelets: 104 10*3/uL — ABNORMAL LOW (ref 150–400)
RBC: 3.05 MIL/uL — ABNORMAL LOW (ref 3.87–5.11)
RDW: 18.9 % — ABNORMAL HIGH (ref 11.5–15.5)
WBC: 2.8 10*3/uL — AB (ref 4.0–10.5)

## 2016-01-14 LAB — BASIC METABOLIC PANEL
ANION GAP: 17 — AB (ref 5–15)
BUN: 121 mg/dL — ABNORMAL HIGH (ref 6–20)
CALCIUM: 8.5 mg/dL — AB (ref 8.9–10.3)
CO2: 21 mmol/L — ABNORMAL LOW (ref 22–32)
Chloride: 97 mmol/L — ABNORMAL LOW (ref 101–111)
Creatinine, Ser: 15.08 mg/dL — ABNORMAL HIGH (ref 0.44–1.00)
GFR, EST AFRICAN AMERICAN: 3 mL/min — AB (ref >60)
GFR, EST NON AFRICAN AMERICAN: 2 mL/min — AB (ref >60)
GLUCOSE: 93 mg/dL (ref 65–99)
Potassium: 4.7 mmol/L (ref 3.5–5.1)
Sodium: 135 mmol/L (ref 135–145)

## 2016-01-14 LAB — STREP PNEUMONIAE URINARY ANTIGEN: STREP PNEUMO URINARY ANTIGEN: NEGATIVE

## 2016-01-14 LAB — GLUCOSE, CAPILLARY
Glucose-Capillary: 114 mg/dL — ABNORMAL HIGH (ref 65–99)
Glucose-Capillary: 203 mg/dL — ABNORMAL HIGH (ref 65–99)
Glucose-Capillary: 167 mg/dL — ABNORMAL HIGH (ref 65–99)

## 2016-01-14 LAB — PROCALCITONIN: Procalcitonin: 0.24 ng/mL

## 2016-01-14 LAB — MRSA PCR SCREENING: MRSA BY PCR: NEGATIVE

## 2016-01-14 LAB — PHOSPHORUS: PHOSPHORUS: 6.2 mg/dL — AB (ref 2.5–4.6)

## 2016-01-14 MED ORDER — DARBEPOETIN ALFA 100 MCG/0.5ML IJ SOSY
100.0000 ug | PREFILLED_SYRINGE | INTRAMUSCULAR | Status: DC
  Administered 2016-01-15: 100 ug via INTRAVENOUS
  Filled 2016-01-14: qty 0.5

## 2016-01-14 MED ORDER — BISACODYL 10 MG RE SUPP
10.0000 mg | Freq: Once | RECTAL | Status: AC
  Administered 2016-01-14: 10 mg via RECTAL
  Filled 2016-01-14: qty 1

## 2016-01-14 MED ORDER — DEXTROSE 5 % IV SOLN
2.0000 g | Freq: Once | INTRAVENOUS | Status: AC
  Administered 2016-01-14: 2 g via INTRAVENOUS
  Filled 2016-01-14 (×2): qty 2

## 2016-01-14 MED ORDER — DEXTROSE 5 % IV SOLN: 2.0000 g | INTRAVENOUS | Status: DC

## 2016-01-14 MED ORDER — OXYCODONE HCL 5 MG PO TABS
ORAL_TABLET | ORAL | Status: AC
  Filled 2016-01-14: qty 2

## 2016-01-14 MED ORDER — ALBUTEROL SULFATE (2.5 MG/3ML) 0.083% IN NEBU
INHALATION_SOLUTION | RESPIRATORY_TRACT | Status: AC
  Administered 2016-01-14: 2.5 mg via RESPIRATORY_TRACT
  Filled 2016-01-14: qty 3

## 2016-01-14 MED ORDER — SIMETHICONE 80 MG PO CHEW
80.0000 mg | CHEWABLE_TABLET | Freq: Once | ORAL | Status: AC
  Administered 2016-01-14: 80 mg via ORAL
  Filled 2016-01-14: qty 1

## 2016-01-14 MED ORDER — IPRATROPIUM BROMIDE 0.02 % IN SOLN: 0.5000 mg | RESPIRATORY_TRACT | Status: DC | PRN

## 2016-01-14 MED ORDER — INSULIN GLARGINE 100 UNIT/ML ~~LOC~~ SOLN
5.0000 [IU] | Freq: Every day | SUBCUTANEOUS | Status: DC
  Administered 2016-01-14 – 2016-01-15 (×2): 5 [IU] via SUBCUTANEOUS
  Filled 2016-01-14 (×3): qty 0.05

## 2016-01-14 NOTE — Progress Notes (Signed)
PROGRESS NOTE  [name] [id number] DOB: 06/08/1966 DOA: 01/13/2016 PCP: [name], MD  Brief History:  bipolar disorder/schizoaffective, Jehovah's Witness (has consented to blood transfusion this admission), HLD, DM 2/IDDM, hepatitis C, chronic pain syndrome, ESRD on TTS HD, noncompliant with HD (missed past week), seizure disorder, chronic combined CHF, anemia, HTN, TIA/stroke, substance abuse/cocaine, presented with increasing sob and N/V.  The patient states that she was discharged from the nursing home on 01/09/2016. Since that time, she has not been able to arrange transportation to get to dialysis.  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  -2-D echo 08/26/14: LVEF 40%. Does not appear extremely volume overloaded  Polysubstance abuse -tobacco & cocaine -last UDS on 11/22/15 neg for cocaine -pt denies any further cocaine  Seizure disorder -no seizures >1 year -has not taken keppra > 6 months -d/c keppra and monitor clinically--pt not taking AED anyway  Hep C  -Further workup/treatment deferred to the outpatient setting    Disposition Plan:   Home in  6/12 or 6/13 if stable  Family Communication:   No Family at  beside  Consultants:  nephrology  Code Status:  FULL    Subjective: Patient complains of low back pain but denies any leg weakness or bowel incontinence. Complains of constipation. Denies any fevers, chills, chest pain, shortness breath, headache, neck pain. No abdominal pain. She states that she is breathing better.  Objective: Filed Vitals:   01/14/16 1155 01/14/16 1157 01/14/16 1222 01/14/16 1250  BP: 211/109 197/97 185/91 196/80  Pulse:  87 91 89  Temp:    97.8 F (36.6 C)  TempSrc:    Oral  Resp:  15 16 18   Weight:   75.2 kg (165 lb 12.6 oz)   SpO2:  99%  93%    Intake/Output Summary (Last 24 hours) at 01/14/16 1435 Last data filed at 01/14/16 1250  Gross per 24 hour  Intake    183 ml  Output   3000 ml  Net  -2817 ml   Weight change:  Exam:   General:  Pt is alert, follows commands appropriately, not in acute distress  HEENT: No icterus, No thrush, No neck mass, [place]/AT  Cardiovascular: RRR, S1/S2, no rubs, no gallops  Respiratory: Bibasilar crackles, right greater than left. No wheezing  Abdomen: Soft/+BS, non tender, non distended, no guarding  Extremities: No edema, No lymphangitis, No petechiae, No rashes, no synovitis; left foot without any draining wounds.   Data Reviewed: I have personally reviewed following labs and imaging studies Basic Metabolic Panel:  Recent Labs Lab 01/13/16 1812 01/14/16 0013 01/14/16 0751  NA 134* 135  --   K 5.4* 4.7  --   CL 95* 97*  --   CO2 22 21*  --   GLUCOSE 230* 93  --   BUN 119* 121*  --   CREATININE 14.72*  15.08*  --   CALCIUM 8.2* 8.5*  --   PHOS  --   --  6.2*   Liver Function Tests: No results for input(s): AST, ALT, ALKPHOS, BILITOT, PROT, ALBUMIN in the last 168 hours. No results for input(s): LIPASE, AMYLASE in the last 168 hours. No results for input(s): AMMONIA in the last 168 hours. Coagulation Profile: No results for input(s): INR, PROTIME in the last 168 hours. CBC:  Recent Labs Lab  01/13/16 1812 01/14/16 0013  WBC 2.2* 2.8*  HGB 8.4* 8.2*  HCT 26.5* 26.1*  MCV 84.9 85.6  PLT 99* 104*   Cardiac Enzymes: No results for input(s): CKTOTAL, CKMB, CKMBINDEX, TROPONINI in the last 168 hours. BNP: Invalid input(s): POCBNP CBG:  Recent Labs Lab 01/13/16 2319 01/14/16 1249  GLUCAP 71 114*   HbA1C: No results for input(s): HGBA1C in the last 72 hours. Urine analysis:    Component Value Date/Time   COLORURINE YELLOW 09/17/2015 0108   APPEARANCEUR CLEAR 09/17/2015 0108   LABSPEC 1.013 09/17/2015 0108   PHURINE 8.0 09/17/2015 0108   GLUCOSEU NEGATIVE 09/17/2015 0108   HGBUR SMALL* 09/17/2015 0108   BILIRUBINUR NEGATIVE 09/17/2015 0108   BILIRUBINUR neg 12/13/2014 1127   KETONESUR NEGATIVE 09/17/2015 0108   PROTEINUR >300* 09/17/2015 0108   PROTEINUR >=300 12/13/2014 1127   UROBILINOGEN 0.2 05/01/2015 1252   UROBILINOGEN 0.2 12/13/2014 1127   NITRITE NEGATIVE 09/17/2015 0108   NITRITE neg 12/13/2014 1127   LEUKOCYTESUR NEGATIVE 09/17/2015 0108   Sepsis Labs: @LABRCNTIP (procalcitonin:4,lacticidven:4) )No results found for this or any previous visit (from the past 240 hour(s)).   Scheduled Meds: . amLODipine  10 mg Oral QHS  . atorvastatin  40 mg Oral QHS  . carvedilol  3.125 mg Oral BID WC  . [START ON 01/15/2016] ceFEPime (MAXIPIME) IV  2 g Intravenous Q M,W,F-HD  . ceFEPime (MAXIPIME) IV  2 g Intravenous Once  . cloNIDine  0.1 mg Oral Q8H  . [START ON 01/15/2016] darbepoetin (ARANESP) injection - DIALYSIS  100 mcg Intravenous Q Mon-HD  . heparin  5,000 Units Subcutaneous Q12H  . insulin aspart  0-15 Units Subcutaneous TID WC  . insulin aspart  0-5 Units Subcutaneous QHS  . insulin glargine  12 Units Subcutaneous QHS  . multivitamin with minerals  1 tablet Oral Daily  . oxyCODONE      . polyethylene glycol  17 g Oral Daily  . polyethylene glycol  17 g Oral Daily  . sevelamer carbonate  2,400 mg Oral TID WC  . sodium chloride flush  3 mL  Intravenous Q12H   Continuous Infusions:   Procedures/Studies: Dg Chest 2 View  01/13/2016  CLINICAL DATA:  50 year old female with shortness of breath and chest pain EXAM: CHEST  2 VIEW COMPARISON:  Chest radiograph dated 12/25/2015 FINDINGS: Two views of the chest demonstrate mild cardiomegaly with increased vascular and interstitial prominence compatible with congestive changes/edema. An area of increased density at the right lung base is concerning for superimposed pneumonia. Clinical correlation is recommended. Trace pleural effusion noted along the right major fissure. There is no pneumothorax. No acute osseous pathology. IMPRESSION: Mild cardiomegaly with cough congestive changes and edema. Right lung base hazy densities concerning for superimposed pneumonia. Clinical correlation and follow-up is recommended. Electronically Signed   By: Anner Crete M.D.   On: 01/13/2016 20:02   Dg Chest Port 1 View  12/25/2015  CLINICAL DATA:  Missed dialysis.  Shortness of breath. EXAM: PORTABLE CHEST 1 VIEW COMPARISON:  11/22/2015. FINDINGS:  Cardiomegaly with diffuse bilateral pulmonary infiltrates consistent pulmonary edema. No prominent pleural effusion. No pneumothorax. Pneumothorax. IMPRESSION: Congestive heart failure with bilateral pulmonary edema. Electronically Signed   By: Marcello Moores  Register   On: 12/25/2015 08:10    Alvina Strother, DO  [facility] Pager [phone]  If 7PM-7AM, please contact night-coverage [email or web address] Password [email or web address] 01/14/2016, 2:35 PM   LOS: 1 day

## 2016-01-14 NOTE — Progress Notes (Signed)
Hemodialysis- Tolerated well. Total UF 3L. Unable to pull more d/t cramping. BP remains elevated. Given clonidine prior to leaving HD. Also medicated for headache. Pt currently on RA, sats 92-98%. Report called to 6E.

## 2016-01-14 NOTE — Progress Notes (Signed)
Pharmacy Antibiotic Note  [name] is a 50 y.o. female admitted on 01/13/2016 with pneumonia.  Pharmacy has been consulted for cefepime dosing. Patient is ESRD on HD, getting extra session of HD today. Plan to resume home schedule of HD MWF tomorrow.  Plan: - Change to Cefepime 2g IV qHD - Monitor C&S, CBC, and clinical progression - Could consider adding anaerobic coverage if truly aspiration pneumonia  Weight: 173 lb 8 oz (78.7 kg)  Temp (24hrs), Avg:97.7 F (36.5 C), Min:97 F (36.1 C), Max:98.6 F (37 C)   Recent Labs Lab 01/13/16 1812 01/14/16 0013  WBC 2.2* 2.8*  CREATININE 14.72* 15.08*    Estimated Creatinine Clearance: 4.6 mL/min (by C-G formula based on Cr of 15.08).    Allergies  Allergen Reactions  . Gabapentin Itching and Swelling  . Lyrica [Pregabalin] Swelling    Facial and leg swelling  . Other     NO BLOOD PRODUCTS. PT IS A JEHOVAH WITNESS  . Penicillins Hives    Has patient had a PCN reaction causing immediate rash, facial/tongue/throat swelling, SOB or lightheadedness with hypotension: Yes Has patient had a PCN reaction causing severe rash involving mucus membranes or skin necrosis: No Has patient had a PCN reaction that required hospitalization No Has patient had a PCN reaction occurring within the last 10 years: No If all of the above answers are "NO", then may proceed with Cephalosporin use. Pt tolerates cefepime and ceftriaxone  . Saphris [Asenapine] Swelling    Facial swelling  . Tramadol Swelling    Leg swelling    Antimicrobials this admission: Vancomycin and Aztreonam ordered but never given Cefepime 6/11>>  Microbiology results: 6/11 Spneumo Ag: neg 6/11 MRSA PCR -  6/11 BCx -  6/11 Sputum Cx -   Thank you for allowing pharmacy to be a part of this patient's care.  Dimitri Ped, PharmD. PGY-1 Pharmacy Resident Pager: [phone] 01/14/2016 8:42 AM

## 2016-01-14 NOTE — Consult Note (Signed)
ILYN [name] is an 50 y.o. female referred by Dr Tat   Chief Complaint: SOB, ESRD, Sec HPTH HPI: 50yo BF with ESRD says she was DC from [facility] but no transportation was arranged for her to get to her Dx center and thus she has not had HD in a week.  She never called the SW at the Dx center.  On HD MWF x 4hr and [place].  Says DW 75kg  Past Medical History  Diagnosis Date  . Neuropathy ([place])   . Glaucoma   . Bipolar 1 disorder ([place])   . Refusal of blood transfusions as patient is Jehovah's Witness   . TIA (transient ischemic attack) 2010  . MRSA infection ?2014    "on the back of my head; spread throughout my bloodstream"  . Bipolar disorder, unspecified ([place]) 02/06/2014  . High cholesterol   . 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 Hepatitis C   . Chronic pain syndrome   . ESRD on hemodialysis ([place])   . Schizoaffective disorder, unspecified condition 02/08/2014  . Seizure [facility]) dx'd 2014    "don't know what kind; last one was ~ 04/2014"  . Chronic combined systolic and diastolic CHF (congestive heart failure) (HCC)     EF 40% by echo and 48% by stress test  . Anemia   . Hypertension   . Depression   . Anxiety   . GERD (gastroesophageal reflux disease)   . History of hiatal hernia   . Headache     sinus headaches  . History of vitamin D deficiency 06/20/2015  . Stroke [facility])     TIA 2009    Past Surgical History  Procedure Laterality Date  . Amputation Left 05/21/2013    Procedure: Left Midfoot AMPUTATION;  Surgeon: Newt Minion, MD;  Location: [place];  Service: Orthopedics;  Laterality: Left;  Left Midfoot Amputation  . Tubal ligation    . Colonoscopy with propofol N/A 06/22/2013    Procedure: COLONOSCOPY WITH PROPOFOL;  Surgeon: Jeryl Columbia, MD;  Location: [facility] ENDOSCOPY;  Service: Endoscopy;  Laterality: N/A;  . Abdominal hysterectomy  2014  . Tonsillectomy  1970's  . Refractive surgery Bilateral 2013  . Tubal ligation  2000  . Abdominal hysterectomy   2010  . Foot amputation Left     due diabetic neuropathy, could not feel ulcer on bottom of foot  . Eye surgery Bilateral 2010    Lasik  . Av fistula placement Left 05/04/2015    Procedure: ARTERIOVENOUS (AV) GRAFT INSERTION;  Surgeon: Angelia Mould, MD;  Location: [place];  Service: Vascular;  Laterality: Left;  . Esophagogastroduodenoscopy N/A 05/11/2015    Procedure: ESOPHAGOGASTRODUODENOSCOPY (EGD);  Surgeon: Laurence Spates, MD;  Location: [facility] ENDOSCOPY;  Service: Endoscopy;  Laterality: N/A;    Family History  Problem Relation Age of Onset  . Hypertension Mother   . Hypertension Father   FH + for ESRD in father, mother and sister sec DM/HTN  Social History:  reports that she quit smoking about 9 months ago. Her smoking use included Cigarettes. She has a 20 pack-year smoking history. She has never used smokeless tobacco. She reports that she drinks about 1.8 oz of alcohol per week. She reports that she uses illicit drugs ("Crack" cocaine and Cocaine). Now back at home living with her child  Allergies:  Allergies  Allergen Reactions  . Gabapentin Itching and Swelling  . Lyrica [Pregabalin] Swelling    Facial and  leg swelling  . Other     NO BLOOD PRODUCTS. PT IS A JEHOVAH WITNESS  . Penicillins Hives    Has patient had a PCN reaction causing immediate rash, facial/tongue/throat swelling, SOB or lightheadedness with hypotension: Yes Has patient had a PCN reaction causing severe rash involving mucus membranes or skin necrosis: No Has patient had a PCN reaction that required hospitalization No Has patient had a PCN reaction occurring within the last 10 years: No If all of the above answers are "NO", then may proceed with Cephalosporin use. Pt tolerates cefepime and ceftriaxone  . Saphris [Asenapine] Swelling    Facial swelling  . Tramadol Swelling    Leg swelling    Medications Prior to Admission  Medication Sig Dispense Refill  . acetaminophen (TYLENOL) 325 MG tablet Take  2 tablets (650 mg total) by mouth every 6 (six) hours as needed for mild pain (or Fever >/= 101).    Marland Kitchen amLODipine (NORVASC) 10 MG tablet Take 1 tablet (10 mg total) by mouth at bedtime.    Marland Kitchen atorvastatin (LIPITOR) 40 MG tablet Take 1 tablet (40 mg total) by mouth at bedtime. 30 tablet 2  . carvedilol (COREG) 3.125 MG tablet Take 3.125 mg by mouth 2 (two) times daily with a meal. Hold if HR <60    . cloNIDine (CATAPRES) 0.1 MG tablet Take 0.1 mg by mouth every 8 (eight) hours.     . diphenhydrAMINE (BENADRYL) 25 mg capsule Take 1 capsule (25 mg total) by mouth every 6 (six) hours as needed for itching. 30 capsule 0  . insulin aspart (NOVOLOG) 100 UNIT/ML injection Inject 0-12 Units into the skin 3 (three) times daily before meals. Per sliding scale inject SQ <150=0units, 151-200=2 units, 201-250=4units, 251-300=6units,301-350=8 units, 351-400=10 units, >/= 401=12 units    . insulin glargine (LANTUS) 100 UNIT/ML injection Inject 12 Units into the skin at bedtime.     Marland Kitchen LORazepam (ATIVAN) 0.5 MG tablet Take 0.5 mg by mouth See admin instructions. Take 1 tablet (0.5 mg) by mouth prior to dialysis and at bedtime as needed for sleep    . Oxycodone HCl 10 MG TABS Take 1 tablet (10 mg total) by mouth every 6 (six) hours as needed (pain). 120 tablet 0  . polyethylene glycol (MIRALAX / GLYCOLAX) packet Mix 1 capful (17 GM) in 8 oz of water and drink by mouth twice daily for constipation.    . polyvinyl alcohol (LIQUIFILM TEARS) 1.4 % ophthalmic solution Place 2 drops into both eyes every 6 (six) hours as needed for dry eyes.     . sevelamer carbonate (RENVELA) 800 MG tablet Take 2,400 mg by mouth 3 (three) times daily with meals.     . promethazine (PHENERGAN) 12.5 MG tablet Take 1 tablet (12.5 mg total) by mouth every 6 (six) hours as needed for nausea. (Patient not taking: Reported on 01/13/2016) 20 tablet 0     Lab Results: UA: ND  Recent Labs  01/13/16 1812 01/14/16 0013  WBC 2.2* 2.8*  HGB 8.4*  8.2*  HCT 26.5* 26.1*  PLT 99* 104*   BMET  Recent Labs  01/13/16 1812 01/14/16 0013  NA 134* 135  K 5.4* 4.7  CL 95* 97*  CO2 22 21*  GLUCOSE 230* 93  BUN 119* 121*  CREATININE 14.72* 15.08*  CALCIUM 8.2* 8.5*   LFT No results for input(s): PROT, ALBUMIN, AST, ALT, ALKPHOS, BILITOT, BILIDIR, IBILI in the last 72 hours. Dg Chest 2 View  01/13/2016  CLINICAL  DATA:  50 year old female with shortness of breath and chest pain EXAM: CHEST  2 VIEW COMPARISON:  Chest radiograph dated 12/25/2015 FINDINGS: Two views of the chest demonstrate mild cardiomegaly with increased vascular and interstitial prominence compatible with congestive changes/edema. An area of increased density at the right lung base is concerning for superimposed pneumonia. Clinical correlation is recommended. Trace pleural effusion noted along the right major fissure. There is no pneumothorax. No acute osseous pathology. IMPRESSION: Mild cardiomegaly with cough congestive changes and edema. Right lung base hazy densities concerning for superimposed pneumonia. Clinical correlation and follow-up is recommended. Electronically Signed   By: Anner Crete M.D.   On: 01/13/2016 20:02    ROS: Appetite decreased + SOB + CP for 3 d + constipation Chronic pain in "every joint" No dysuria No change in vision  PHYSICAL EXAM: Blood pressure 186/80, pulse 76, temperature 97.4 F (36.3 C), temperature source Oral, resp. rate 20, SpO2 97 %. HEENT: PERRLA EOMI NECK:Milf JVD LUNGS:Decreased BS bases, few basilar crackles CARDIAC:RRR 2/6 systolic M ABD:+ BS NTND No HSM EXT:No edema  Lt midfoot amputation, well healed.  Lt Arm AVG + bruit NEURO:CNI M&SI Ox3 No asterixis  Assessment: 1. Mild pulm edema 2. Mild uremia 3. Anemia 4. Sec HPTH 5. HTN  6. DM 7. ? PNA  On cefipime PLAN: 1. HD today and again tomorrow to get back on schedule 2. Resume EPO 3. Cont PO4 binders and check PO4 level 4. Discussed the need for her  to be compliant with HD.  Transportation should have been arranged prior to DC from NH and I told her she can always call SW at Dx unit to help   Kaye Luoma T 01/14/2016, 7:11 AM

## 2016-01-14 NOTE — Procedures (Signed)
Pt seen on HD.  Ap 110  VP 210.  BFR 400.  SBP 197.  Mild cramping after just 2L removal.

## 2016-01-15 ENCOUNTER — Inpatient Hospital Stay (HOSPITAL_COMMUNITY): Payer: Medicare Other

## 2016-01-15 DIAGNOSIS — S42201P Unspecified fracture of upper end of right humerus, subsequent encounter for fracture with malunion: Secondary | ICD-10-CM

## 2016-01-15 LAB — CBC WITH DIFFERENTIAL/PLATELET
Basophils Absolute: 0 10*3/uL (ref 0.0–0.1)
Basophils Relative: 1 %
EOS PCT: 3 %
Eosinophils Absolute: 0.1 10*3/uL (ref 0.0–0.7)
HCT: 26.9 % — ABNORMAL LOW (ref 36.0–46.0)
Hemoglobin: 8.4 g/dL — ABNORMAL LOW (ref 12.0–15.0)
LYMPHS ABS: 0.8 10*3/uL (ref 0.7–4.0)
LYMPHS PCT: 34 %
MCH: 27.2 pg (ref 26.0–34.0)
MCHC: 31.2 g/dL (ref 30.0–36.0)
MCV: 87.1 fL (ref 78.0–100.0)
MONO ABS: 0.2 10*3/uL (ref 0.1–1.0)
MONOS PCT: 10 %
Neutro Abs: 1.2 10*3/uL — ABNORMAL LOW (ref 1.7–7.7)
Neutrophils Relative %: 52 %
PLATELETS: 99 10*3/uL — AB (ref 150–400)
RBC: 3.09 MIL/uL — ABNORMAL LOW (ref 3.87–5.11)
RDW: 18.5 % — AB (ref 11.5–15.5)
WBC: 2.4 10*3/uL — ABNORMAL LOW (ref 4.0–10.5)

## 2016-01-15 LAB — GLUCOSE, CAPILLARY
GLUCOSE-CAPILLARY: 214 mg/dL — AB (ref 65–99)
Glucose-Capillary: 129 mg/dL — ABNORMAL HIGH (ref 65–99)
Glucose-Capillary: 140 mg/dL — ABNORMAL HIGH (ref 65–99)

## 2016-01-15 LAB — RENAL FUNCTION PANEL
Albumin: 2.8 g/dL — ABNORMAL LOW (ref 3.5–5.0)
Anion gap: 11 (ref 5–15)
BUN: 39 mg/dL — AB (ref 6–20)
CHLORIDE: 97 mmol/L — AB (ref 101–111)
CO2: 25 mmol/L (ref 22–32)
CREATININE: 7.46 mg/dL — AB (ref 0.44–1.00)
Calcium: 8.5 mg/dL — ABNORMAL LOW (ref 8.9–10.3)
GFR calc non Af Amer: 6 mL/min — ABNORMAL LOW (ref >60)
GFR, EST AFRICAN AMERICAN: 7 mL/min — AB (ref >60)
GLUCOSE: 219 mg/dL — AB (ref 65–99)
Phosphorus: 4.9 mg/dL — ABNORMAL HIGH (ref 2.5–4.6)
Potassium: 4.4 mmol/L (ref 3.5–5.1)
Sodium: 133 mmol/L — ABNORMAL LOW (ref 135–145)

## 2016-01-15 MED ORDER — INSULIN NPH ISOPHANE & REGULAR (70-30) 100 UNIT/ML ~~LOC~~ SUSP: 8.0000 [IU] | Freq: Two times a day (BID) | SUBCUTANEOUS | Status: DC

## 2016-01-15 MED ORDER — DARBEPOETIN ALFA 100 MCG/0.5ML IJ SOSY
PREFILLED_SYRINGE | INTRAMUSCULAR | Status: AC
  Administered 2016-01-15: 100 ug via INTRAVENOUS
  Filled 2016-01-15: qty 0.5

## 2016-01-15 NOTE — Care Management Important Message (Signed)
Important Message  Patient Details  Name: LASONJA SPEHAR MRN: [id number] Date of Birth: 1965/09/26   Medicare Important Message Given:  Yes    Loann Quill 01/15/2016, 1:57 PM

## 2016-01-15 NOTE — Progress Notes (Addendum)
PROGRESS NOTE  [name] [id number] DOB: 1966/03/29 DOA: 01/13/2016 PCP: [name], MD  Brief History:  bipolar disorder/schizoaffective, Jehovah's Witness (has consented to blood transfusion this admission), HLD, DM 2/IDDM, hepatitis C, chronic pain syndrome, ESRD on TTS HD, noncompliant with HD (missed past week), seizure disorder, chronic combined CHF, anemia, HTN, TIA/stroke, substance abuse/cocaine, presented with increasing sob and N/V. The patient states that she was discharged from the nursing home on 01/09/2016. Since that time, she has not been able to arrange transportation to get to dialysis. She also states has not taken any of her medications due to financial difficulty. It was verified that the patient had Medicare and Medicaid through care management. As a result, no further assistance was able to be provided at this time. The patient was educated regarding getting her insulin through [employer]  Assessment/Plan: Pulmonary Edema/Fluid Overload -secondary to noncompliance with outpt HD -dyspnea improved with HD--the patient had dialysis on consecutive days with improvement of her dyspnea -6/11 repeat CXR-mild edma but improvement in RLL opacity -appreciate nephrology -01/15/16--pt remained mildly hypoxemic after HD with SPO2= 90%RA-->repeat CXR-->may need further HD fluid removal -daily weights -echo  Pulmonary infiltrate -?RLL infiltrate -repeat CXR as above -check procalcitonin--0.24 -As a result, the patient's antibiotics were discontinued, and the patient was monitored clinically.   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  -2-D echo 08/26/14: LVEF 40%. Does not appear extremely volume overloaded  Polysubstance abuse -tobacco & cocaine -last UDS on 11/22/15 neg for cocaine -pt denies any further cocaine  Seizure disorder -no seizures >1 year -has not taken keppra > 6 months -d/c keppra and monitor clinically--pt not taking AED anyway  Hep C  -Further workup/treatment deferred to the outpatient setting   Disposition Plan:   Home 01/16/16 if stable Family Communication:   No Family at beside  Consultants:  Nephrology  Code Status:  FULL    Subjective: Patient said that she feels a little lightheaded and some shortness of breath after dialysis today. Denies any fevers, chills, headache, neck pain, chest pain, vomiting, diarrhea.  Objective: Filed Vitals:   01/15/16 1150 01/15/16 1216 01/15/16 1243 01/15/16 1750  BP: 160/87  154/73 161/62  Pulse: 79  79 80  Temp:   98.5 F (36.9 C) 98.9 F (37.2 C)  TempSrc:   Oral Oral  Resp: 20  19 18   Weight: 72.5 kg (159 lb 13.3 oz)     SpO2: 99% 90% 98% 97%    Intake/Output Summary (Last 24 hours) at 01/15/16 1833 Last data filed at 01/15/16 1750  Gross per 24 hour  Intake    363 ml  Output   2901 ml  Net  -2538 ml   Weight change:  Exam:   General:  Pt is alert, follows commands appropriately, not in acute distress  HEENT: No icterus, No thrush, No neck mass, [place]/AT  Cardiovascular: RRR, S1/S2, no rubs, no gallops  Respiratory: Bibasilar crackles. No wheezes  Abdomen: Soft/+BS, non tender, non distended, no guarding  Extremities: No edema, No  lymphangitis, No petechiae, No rashes, no synovitis   Data Reviewed: I have personally reviewed following labs and imaging studies Basic Metabolic Panel:  Recent Labs Lab 01/13/16 1812 01/14/16 0013  01/14/16 0751 01/15/16 0745  NA 134* 135  --  133*  K 5.4* 4.7  --  4.4  CL 95* 97*  --  97*  CO2 22 21*  --  25  GLUCOSE 230* 93  --  219*  BUN 119* 121*  --  39*  CREATININE 14.72* 15.08*  --  7.46*  CALCIUM 8.2* 8.5*  --  8.5*  PHOS  --   --  6.2* 4.9*   Liver Function Tests:  Recent Labs Lab 01/15/16 0745  ALBUMIN 2.8*   No results for input(s): LIPASE, AMYLASE in the last 168 hours. No results for input(s): AMMONIA in the last 168 hours. Coagulation Profile: No results for input(s): INR, PROTIME in the last 168 hours. CBC:  Recent Labs Lab 01/13/16 1812 01/14/16 0013 01/15/16 0745  WBC 2.2* 2.8* 2.4*  NEUTROABS  --   --  1.2*  HGB 8.4* 8.2* 8.4*  HCT 26.5* 26.1* 26.9*  MCV 84.9 85.6 87.1  PLT 99* 104* 99*   Cardiac Enzymes: No results for input(s): CKTOTAL, CKMB, CKMBINDEX, TROPONINI in the last 168 hours. BNP: Invalid input(s): POCBNP CBG:  Recent Labs Lab 01/14/16 1249 01/14/16 1713 01/14/16 2036 01/15/16 1240 01/15/16 1732  GLUCAP 114* 203* 167* 129* 214*   HbA1C: No results for input(s): HGBA1C in the last 72 hours. Urine analysis:    Component Value Date/Time   COLORURINE YELLOW 09/17/2015 0108   APPEARANCEUR CLEAR 09/17/2015 0108   LABSPEC 1.013 09/17/2015 0108   PHURINE 8.0 09/17/2015 0108   GLUCOSEU NEGATIVE 09/17/2015 0108   HGBUR SMALL* 09/17/2015 0108   BILIRUBINUR NEGATIVE 09/17/2015 0108   BILIRUBINUR neg 12/13/2014 1127   KETONESUR NEGATIVE 09/17/2015 0108   PROTEINUR >300* 09/17/2015 0108   PROTEINUR >=300 12/13/2014 1127   UROBILINOGEN 0.2 05/01/2015 1252   UROBILINOGEN 0.2 12/13/2014 1127   NITRITE NEGATIVE 09/17/2015 0108   NITRITE neg 12/13/2014 1127   LEUKOCYTESUR NEGATIVE 09/17/2015 0108   Sepsis Labs: @LABRCNTIP (procalcitonin:4,lacticidven:4) ) Recent Results (from the past 240 hour(s))  Culture, blood (routine x 2) Call MD if unable to obtain prior to antibiotics being given     Status: None (Preliminary  result)   Collection Time: 01/14/16 12:13 AM  Result Value Ref Range Status   Specimen Description BLOOD RIGHT HAND  Final   Special Requests IN PEDIATRIC BOTTLE 1CC  Final   Culture NO GROWTH 1 DAY  Final   Report Status PENDING  Incomplete  Culture, blood (routine x 2) Call MD if unable to obtain prior to antibiotics being given     Status: None (Preliminary result)   Collection Time: 01/14/16 12:24 AM  Result Value Ref Range Status   Specimen Description BLOOD RIGHT HAND  Final   Special Requests IN PEDIATRIC BOTTLE 3CC  Final   Culture NO GROWTH 1 DAY  Final   Report Status PENDING  Incomplete  MRSA PCR Screening     Status: Abnormal   Collection Time: 01/14/16  1:55 AM  Result Value Ref Range Status   MRSA by PCR (A) NEGATIVE Final    INVALID, UNABLE TO DETERMINE THE PRESENCE OF TARGET DNA DUE TO SPECIMEN INTEGRITY. RECOLLECTION REQUESTED.    Comment:        The GeneXpert MRSA Assay (FDA approved for NASAL specimens only), is one component of  a comprehensive MRSA colonization surveillance program. It is not intended to diagnose MRSA infection nor to guide or monitor treatment for MRSA infections. CALLED ALBERT RIO ON 01/14/16 AT 4 BY K[facility]        The GeneXpert MRSA Assay (FDA approved for NASAL specimens only), is one component of a comprehensive MRSA colonization surveillance program. It is not intended to diagnose MRSA infection nor to guide or monitor treatment for MRSA infections.   MRSA PCR Screening     Status: None   Collection Time: 01/14/16  4:38 PM  Result Value Ref Range Status   MRSA by PCR NEGATIVE NEGATIVE Final    Comment:        The GeneXpert MRSA Assay (FDA approved for NASAL specimens only), is one component of a comprehensive MRSA colonization surveillance program. It is not intended to diagnose MRSA infection nor to guide or monitor treatment for MRSA infections.      Scheduled Meds: . amLODipine  10 mg Oral QHS  . atorvastatin  40  mg Oral QHS  . carvedilol  3.125 mg Oral BID WC  . cloNIDine  0.1 mg Oral Q8H  . darbepoetin (ARANESP) injection - DIALYSIS  100 mcg Intravenous Q Mon-HD  . heparin  5,000 Units Subcutaneous Q12H  . insulin aspart  0-15 Units Subcutaneous TID WC  . insulin aspart  0-5 Units Subcutaneous QHS  . insulin glargine  5 Units Subcutaneous QHS  . multivitamin with minerals  1 tablet Oral Daily  . polyethylene glycol  17 g Oral Daily  . polyethylene glycol  17 g Oral Daily  . sevelamer carbonate  2,400 mg Oral TID WC  . sodium chloride flush  3 mL Intravenous Q12H   Continuous Infusions:   Procedures/Studies: Dg Chest 2 View  01/13/2016  CLINICAL DATA:  50 year old female with shortness of breath and chest pain EXAM: CHEST  2 VIEW COMPARISON:  Chest radiograph dated 12/25/2015 FINDINGS: Two views of the chest demonstrate mild cardiomegaly with increased vascular and interstitial prominence compatible with congestive changes/edema. An area of increased density at the right lung base is concerning for superimposed pneumonia. Clinical correlation is recommended. Trace pleural effusion noted along the right major fissure. There is no pneumothorax. No acute osseous pathology. IMPRESSION: Mild cardiomegaly with cough congestive changes and edema. Right lung base hazy densities concerning for superimposed pneumonia. Clinical correlation and follow-up is recommended. Electronically Signed   By: Anner Crete M.D.   On: 01/13/2016 20:02   Dg Chest Port 1 View  01/14/2016  CLINICAL DATA:  followup of pulmonary edema. EXAM: PORTABLE CHEST 1 VIEW COMPARISON:  1 day prior FINDINGS: Midline trachea. Cardiomegaly accentuated by AP portable technique. Numerous leads and wires project over the chest. No pleural effusion or pneumothorax. Mild interstitial edema is similar, given differences in technique. The previously described right base airspace disease is less distinct today. There may be mild bibasilar  atelectasis. IMPRESSION: Similar mild congestive heart failure. Electronically Signed   By: Abigail Miyamoto M.D.   On: 01/14/2016 15:46   Dg Chest Port 1 View  12/25/2015  CLINICAL DATA:  Missed dialysis.  Shortness of breath. EXAM: PORTABLE CHEST 1 VIEW COMPARISON:  11/22/2015. FINDINGS: Cardiomegaly with diffuse bilateral pulmonary infiltrates consistent pulmonary edema. No prominent pleural effusion. No pneumothorax. Pneumothorax. IMPRESSION: Congestive heart failure with bilateral pulmonary edema. Electronically Signed   By: Marcello Moores  Register   On: 12/25/2015 08:10    Syna Gad, DO  [facility] Pager [phone]  If 7PM-7AM, please  contact night-coverage [email or web address] Password [facility] 01/15/2016, 6:33 PM   LOS: 2 days

## 2016-01-15 NOTE — Procedures (Signed)
Breathing better.  No chills, fever, afebrile.  Breathing problems resolved w dialysis (and abx). Never had fevers or ^WBC, suspect vol excess from missed HD.  Ok for [employer] after HD today. Lower dry wt if possible today.    I was present at this dialysis session, have reviewed the session itself and made  appropriate changes Kelly Splinter MD [place] pager [phone]    cell [phone] 01/15/2016, 10:44 AM

## 2016-01-15 NOTE — Procedures (Deleted)
Breathing better.  No chills, fever, afebrile.  Breathing problems resolved w dialysis (and abx). Never had fevers or ^WBC, suspect vol excess from missed HD.  Ok for [employer] after HD today. Lower dry wt if possible today.    I was present at this dialysis session, have reviewed the session itself and made  appropriate changes Kelly Splinter MD [place] pager [phone]    cell [phone] 01/15/2016, 10:42 AM

## 2016-01-15 NOTE — Progress Notes (Addendum)
Hemodialysis- Tolerated treatment well. Total UF 2.9L. BP 168/85. 02 Sats on RA 90%. Pt states she "feel better with oxygen on.". Placed back on 2L [place]. Reports some dizziness when standing to weigh after HD. Relieved with sitting. Report called to primary RN. Notified Renal PA.

## 2016-01-15 NOTE — Consult Note (Signed)
   [facility] CM Inpatient Consult   01/15/2016  JALI COCKFIELD [birth year] [id number]    Patient evaluated for long-term disease management services with [facility] program. Martin Majestic to bedside to discuss and offer [facility] services. Patient is agreeable and written consent signed. She was followed by [facility] in the past. However, she has been in SNF for a period of time. Explained to patient that she will receive post hospital transition of care calls and will be evaluated for monthly home visits. Ms. Deeter states she rather not have anyone come to her house because her son, whom she now lives with, has a dog. Discussed visits at the MD office if possible. Confirmed Primary Care MD as Justin Mend.  Confirmed best contact number as [phone]. Ms. Strawderman endorses she has issues with affording medications. She states she has exhausted all of her funds to move in with her son. States she was recently discharged from SNF. She goes to [facility] on [address]. She reports she uses Medicaid transportation. States she will not be able to afford her medications until next month. Ms. Seman also states her son is at work currently and she will be unable to get into the house today. Discussed with inpatient RNCM. Left [facility] Care Management packet and contact information at bedside. Made inpatient RNCM aware that patient will be followed by [facility] post hospital discharge.   Will request for patient to be assigned for [facility] RNCM, [facility] LCSW, and [facility] Pharmacist. She also goes to HD on Mondays, Wednesdays, and Fridays. She has a history of end-stage renal disease on hemodialysis, bipolar disorder TIA, hepatitis C and diabetes mellitus, CHF.   [name], MSN-Ed, RN,BSN [facility] Liaison [phone]

## 2016-01-15 NOTE — Progress Notes (Signed)
Had a huge bowel movement a post enema.

## 2016-01-16 ENCOUNTER — Inpatient Hospital Stay (HOSPITAL_COMMUNITY): Payer: Medicare Other

## 2016-01-16 ENCOUNTER — Other Ambulatory Visit: Payer: Self-pay

## 2016-01-16 DIAGNOSIS — E1122 Type 2 diabetes mellitus with diabetic chronic kidney disease: Secondary | ICD-10-CM

## 2016-01-16 DIAGNOSIS — I509 Heart failure, unspecified: Secondary | ICD-10-CM

## 2016-01-16 DIAGNOSIS — N185 Chronic kidney disease, stage 5: Secondary | ICD-10-CM

## 2016-01-16 LAB — GLUCOSE, CAPILLARY
GLUCOSE-CAPILLARY: 234 mg/dL — AB (ref 65–99)
Glucose-Capillary: 240 mg/dL — ABNORMAL HIGH (ref 65–99)
Glucose-Capillary: 190 mg/dL — ABNORMAL HIGH (ref 65–99)

## 2016-01-16 LAB — ECHOCARDIOGRAM COMPLETE
E decel time: 246 msec
EERAT: 14.12
FS: 20 % — AB (ref 28–44)
IV/PV OW: 1.03
LA ID, A-P, ES: 35 mm
LA diam index: 1.96 cm/m2
LA vol A4C: 69 ml
LAVOL: 84.4 mL
LAVOLIN: 47.2 mL/m2
LEFT ATRIUM END SYS DIAM: 35 mm
LV PW d: 15.8 mm — AB (ref 0.6–1.1)
LVEEAVG: 14.12
LVEEMED: 14.12
LVELAT: 7.72 cm/s
LVOT area: 4.52 cm2
LVOT diameter: 24 mm
MV Dec: 246
MV pk E vel: 109 m/s
MVPG: 5 mmHg
MVPKAVEL: 76.2 m/s
TDI e' lateral: 7.72
TDI e' medial: 5.77
WEIGHTICAEL: 2609.6 [oz_av]

## 2016-01-16 LAB — PROCALCITONIN: PROCALCITONIN: 0.6 ng/mL

## 2016-01-16 MED ORDER — CARVEDILOL 3.125 MG PO TABS: 3.1250 mg | ORAL_TABLET | Freq: Two times a day (BID) | ORAL | Status: DC

## 2016-01-16 MED ORDER — ATORVASTATIN CALCIUM 40 MG PO TABS: 40.0000 mg | ORAL_TABLET | Freq: Every day | ORAL | Status: DC

## 2016-01-16 MED ORDER — CLONIDINE HCL 0.1 MG PO TABS: 0.1000 mg | ORAL_TABLET | Freq: Three times a day (TID) | ORAL | Status: DC

## 2016-01-16 MED ORDER — AMLODIPINE BESYLATE 10 MG PO TABS: 10.0000 mg | ORAL_TABLET | Freq: Every day | ORAL | Status: DC

## 2016-01-16 MED ORDER — PRO-STAT SUGAR FREE PO LIQD
30.0000 mL | Freq: Two times a day (BID) | ORAL | Status: DC
  Administered 2016-01-16 – 2016-01-17 (×3): 30 mL via ORAL
  Filled 2016-01-16 (×2): qty 30

## 2016-01-16 MED ORDER — SEVELAMER CARBONATE 800 MG PO TABS: 2400.0000 mg | ORAL_TABLET | Freq: Three times a day (TID) | ORAL | Status: DC

## 2016-01-16 MED ORDER — INSULIN GLARGINE 100 UNIT/ML ~~LOC~~ SOLN
10.0000 [IU] | Freq: Every day | SUBCUTANEOUS | Status: DC
  Administered 2016-01-16: 10 [IU] via SUBCUTANEOUS
  Filled 2016-01-16 (×2): qty 0.1

## 2016-01-16 MED ORDER — INSULIN GLARGINE 100 UNIT/ML ~~LOC~~ SOLN: 12.0000 [IU] | Freq: Every day | SUBCUTANEOUS | Status: DC

## 2016-01-16 MED ORDER — INSULIN ASPART 100 UNIT/ML ~~LOC~~ SOLN: 0.0000 [IU] | Freq: Three times a day (TID) | SUBCUTANEOUS | Status: DC

## 2016-01-16 MED FILL — ATORVASTATIN 40 MG TABLET: 40 | 30 days supply | Qty: 30 | Fill #0

## 2016-01-16 MED FILL — RENVELA 800 MG TABLET: 800 | 30 days supply | Qty: 270 | Fill #0

## 2016-01-16 MED FILL — AMLODIPINE BESYLATE 10 MG T: 10 | 30 days supply | Qty: 30 | Fill #0

## 2016-01-16 MED FILL — CARVEDILOL 3.125 MG TABLET: 3.125 | 30 days supply | Qty: 60 | Fill #0

## 2016-01-16 MED FILL — cloNIDine HCL 0.1 MG TABS: 0.1 | 30 days supply | Qty: 90 | Fill #0

## 2016-01-16 MED FILL — LANTUS 100 UNITS/ML VIAL: 100 | 28 days supply | Qty: 10 | Fill #0

## 2016-01-16 NOTE — Patient Outreach (Signed)
I received a message from [name], RN, hospital liaison, in regards to [name].  Ms. Marcos is currently still hospitalized.  She has indicated to [place] that she cannot afford her medications once she is discharged due to using up all her funds to move in with her son.  Due to her high risk of rehospitalization, she will qualify for the Pharmacy Emergency Fund.  I will purchase a month supply of her medications using the fund. I have asked Orrin Brigham to notify the inpatient care team that she qualifies for the fund and to send prescriptions for her noncontrolled substances to [facility].  I will follow up in a week to make sure she has all her medications and will be able to purchase them next month.   Deanne Coffer, PharmD, [place] [phone]

## 2016-01-16 NOTE — Care Management Note (Addendum)
Case Management Note  Patient Details  Name: [name] MRN: [id number] Date of Birth: 19-May-1966  Subjective/Objective:        CM following for progression and d/c planning.             Action/Plan: 01/16/2016 Continue to follow for needs, pt with ongoing concerns re ability to obtain medications, however this pt has Medicare and Medicaid therefore CM is unable to assist with meds. Pt is being followed by [facility], and per Orrin Brigham they will assist pt with her copays for meds. All prescriptions must be sent to [place] for the pt to receive this benefit. Pt RN A Kirkman notified and she stated that she would notify Dr Tat of this. CSW , Shelle Iron to speak with pt re transportation needs. No HH or DME needs identified.  Info re need for new prescriptions to be faxed to [facility], texted to Dr Tat.   Expected Discharge Date:                  Expected Discharge Plan:  Home/Self Care  In-House Referral:  Clinical Social Work  Discharge planning Services  CM Consult  Post Acute Care Choice:  NA Choice offered to:   NA  DME Arranged:   NA DME Agency:   NA  HH Arranged:   NA HH Agency:   NA  Status of Service:  In process, will continue to follow  Medicare Important Message Given:  Yes Date Medicare IM Given:    Medicare IM give by:    Date Additional Medicare IM Given:    Additional Medicare Important Message give by:     If discussed at [place] of Stay Meetings, dates discussed:    Additional Comments:  Adron Bene, RN 01/16/2016, 2:30 PM

## 2016-01-16 NOTE — Progress Notes (Signed)
  Echocardiogram 2D Echocardiogram has been performed.  [name] 01/16/2016, 10:11 AM

## 2016-01-16 NOTE — Clinical Social Work Note (Signed)
CSW talked with patient regarding her dialysis transportation and per Ms. Blondell Reveal transportation (via [facility]) will be picking her on Friday at 10 am. There was a breakdown in the transportation before her admission to the hospital which caused her to miss 3 days of dialysis, however per patient, this has been resolved with [facility] transportation and her transport is confirmed.  Lannah Koike Givens, MSW, LCSW Licensed Clinical Social Worker [place] [phone]

## 2016-01-16 NOTE — Progress Notes (Signed)
[facility] Progress Note   Subjective: "I think I need some penicillin..I'm cold and I still get SOB..." Lying flat in bed on nasal cannula, no WOB, NAD.   Objective Filed Vitals:   01/15/16 1750 01/15/16 2011 01/16/16 0510 01/16/16 0756  BP: 161/62 157/72 136/68 159/70  Pulse: 80 79 75 71  Temp: 98.9 F (37.2 C) 99 F (37.2 C) 98.2 F (36.8 C) 98 F (36.7 C)  TempSrc: Oral Oral Oral Oral  Resp: 18 18 18 17   Weight:  73.982 kg (163 lb 1.6 oz)    SpO2: 97% 95% 100% 95%   Physical Exam General: well nourished, NAD Heart: S1, S2, RRR  Lungs: Bilateral breath sounds few scattered crackles posteriorly.  Abdomen: soft, non-tender, active BS Extremities: No LE edema Dialysis Access: LFA AVG + bruit  Dialysis Orders: [facility] MWF 3:45 400/800 EDW 75 kg 2.0K/2.5 Ca No heparin Mircera 150 mcg IV q 2 weeks (last dose 01/03/16) Venofer 50 mg IV q week (last dose 01/03/16)   Additional Objective Labs: Basic Metabolic Panel:  Recent Labs Lab 01/13/16 1812 01/14/16 0013 01/14/16 0751 01/15/16 0745  NA 134* 135  --  133*  K 5.4* 4.7  --  4.4  CL 95* 97*  --  97*  CO2 22 21*  --  25  GLUCOSE 230* 93  --  219*  BUN 119* 121*  --  39*  CREATININE 14.72* 15.08*  --  7.46*  CALCIUM 8.2* 8.5*  --  8.5*  PHOS  --   --  6.2* 4.9*   Liver Function Tests:  Recent Labs Lab 01/15/16 0745  ALBUMIN 2.8*   No results for input(s): LIPASE, AMYLASE in the last 168 hours. CBC:  Recent Labs Lab 01/13/16 1812 01/14/16 0013 01/15/16 0745  WBC 2.2* 2.8* 2.4*  NEUTROABS  --   --  1.2*  HGB 8.4* 8.2* 8.4*  HCT 26.5* 26.1* 26.9*  MCV 84.9 85.6 87.1  PLT 99* 104* 99*   Blood Culture    Component Value Date/Time   SDES BLOOD RIGHT HAND 01/14/2016 0024   SPECREQUEST IN PEDIATRIC BOTTLE 3CC 01/14/2016 0024   CULT NO GROWTH 1 DAY 01/14/2016 0024   REPTSTATUS PENDING 01/14/2016 0024    Cardiac Enzymes: No results for input(s): CKTOTAL, CKMB, CKMBINDEX,  TROPONINI in the last 168 hours. CBG:  Recent Labs Lab 01/14/16 2036 01/15/16 1240 01/15/16 1732 01/15/16 2009 01/16/16 0754  GLUCAP 167* 129* 214* 140* 240*   Iron Studies: No results for input(s): IRON, TIBC, TRANSFERRIN, FERRITIN in the last 72 hours. @lablastinr3 @ Studies/Results: Dg Chest 2 View  01/15/2016  CLINICAL DATA:  Followup for pulmonary edema. EXAM: CHEST  2 VIEW COMPARISON:  01/14/2016 pain 01/13/2016. FINDINGS: Mild enlargement of the cardiopericardial silhouette. No mediastinal or hilar masses or evidence of adenopathy. Interstitial thickening has improved over the last 2 days. There is persistent lung base opacity most evident in the right lower lobe, which partly silhouettes the posterior right hemidiaphragm. There is a probable minimal right pleural effusion. No new lung opacities.  No pneumothorax. IMPRESSION: 1. Findings are consistent with improved pulmonary edema. 2. Residual opacity the right lower lobe is most likely atelectasis combination with a minimal effusion. Pneumonia is possible but felt less likely. Electronically Signed   By: Lajean Manes M.D.   On: 01/15/2016 20:26   Dg Chest Port 1 View  01/14/2016  CLINICAL DATA:  followup of pulmonary edema. EXAM: PORTABLE CHEST 1 VIEW COMPARISON:  1 day prior FINDINGS:  Midline trachea. Cardiomegaly accentuated by AP portable technique. Numerous leads and wires project over the chest. No pleural effusion or pneumothorax. Mild interstitial edema is similar, given differences in technique. The previously described right base airspace disease is less distinct today. There may be mild bibasilar atelectasis. IMPRESSION: Similar mild congestive heart failure. Electronically Signed   By: Abigail Miyamoto M.D.   On: 01/14/2016 15:46   Medications:   . amLODipine  10 mg Oral QHS  . atorvastatin  40 mg Oral QHS  . carvedilol  3.125 mg Oral BID WC  . cloNIDine  0.1 mg Oral Q8H  . darbepoetin (ARANESP) injection - DIALYSIS  100  mcg Intravenous Q Mon-HD  . heparin  5,000 Units Subcutaneous Q12H  . insulin aspart  0-15 Units Subcutaneous TID WC  . insulin aspart  0-5 Units Subcutaneous QHS  . insulin glargine  5 Units Subcutaneous QHS  . multivitamin with minerals  1 tablet Oral Daily  . polyethylene glycol  17 g Oral Daily  . polyethylene glycol  17 g Oral Daily  . sevelamer carbonate  2,400 mg Oral TID WC  . sodium chloride flush  3 mL Intravenous Q12H   Assessment/Plan: 1. Volume Overload/Pulmonary Edema: 2/2 noncompliance with hemodialysis. Was supposed to be [place] home yesterday, however O2 Sats 90% on RA. Repeat CXR 01/15/16 showed improved pulmonary edema, RLL opacity atelectasias combined with minimal effusion. Still C/O SOB. Check O2 sat on RA. Antibiotics Dc'd per primary.  2. ESRD -MWF @ [facility]. Has had serial HD past 2 days. 3. Anemia - HGB 8.4 last ESA dose 01/15/16 Aranesp 100 mcg IV. Follow HGB 4. Secondary hyperparathyroidism - Cont binders phos down to 4.9 Ca 8.5 5. HTN/volume -Serial HD 01/14/16 and 01/15/16. Pre wt yesterday 75.6 kg Net UF 2901 Post wt 72.5.  Lower EDW upon DC. Better control of BP.  6. Nutrition - Albumin 2.8 renal/carb mod diet. Add prostat/renal vit 7. DM: per primary.   Rita H. Brown NP-C 01/16/2016, 11:34 AM  [facility] [phone]  Pt seen, examined and agree w A/P as above. F/U CXR shows resolution of pulm edema.  Should not be hypoxic at this point, would check RA sat.   Kelly Splinter MD [facility] pager [phone]    cell [phone] 01/16/2016, 2:44 PM

## 2016-01-16 NOTE — Discharge Summary (Signed)
Physician Discharge Summary  [name] [id number] DOB: 08-Jan-1966 DOA: 01/13/2016  PCP: [name], MD  Admit date: 01/13/2016 Discharge date: 01/17/16 Admitted From: Home Disposition:  Home  Recommendations for Outpatient Follow-up:  1. Follow up with PCP in 1-2 weeks 2. Please obtain BMP/CBC in one week   Home Health:NO Equipment/Devices:none  Discharge Condition:stable CODE STATUS:FULL Diet recommendation: Renal with 1200 cc fluid restriction  Brief/Interim Summary 50 year old female patient with history bipolar disorder/schizoaffective, Jehovah's Witness (has consented to blood transfusion this admission), HLD, DM 2/IDDM, hepatitis C, chronic pain syndrome, ESRD on TTS HD, noncompliant with HD (missed past week), seizure disorder, chronic combined CHF, anemia, HTN, TIA/stroke, substance abuse/cocaine, presented with increasing sob and N/V. The patient states that she was discharged from the nursing home on 01/09/2016. Since that time, she has not been able to arrange transportation to get to dialysis. She also states has not taken any of her medications due to financial difficulty.  It was verified that the patient had Medicare and Medicaid through care management. As a result, [facility] was consulted further assistance was able to be provided at this time. [facility] will pay for the patient's co-pays if she gets the meds at the [facility]. The patient was educated regarding getting her insulin through [facility] in the future  Discharge Diagnoses:  Pulmonary Edema/Fluid Overload -secondary to noncompliance with outpt HD -dyspnea improved with HD--the patient had dialysis on consecutive days with improvement of her dyspnea -6/12 repeat CXR-improving edema -appreciate nephrology -01/16/16--oxygen sat 96-97 % on RA  Pulmonary infiltrate -?RLL infiltrate -repeat CXR as above -check procalcitonin--0.24 -As a result, the patient's antibiotics were discontinued, and the  patient was monitored clinically. 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  -2-D echo 08/26/14: LVEF 40%. Does not appear extremely volume overloaded  Polysubstance abuse -tobacco & cocaine -last UDS on 11/22/15 neg for cocaine -pt denies any further cocaine  Seizure disorder -no seizures >1 year -has not taken keppra > 6 months -d/c keppra and monitor clinically--pt not taking AED anyway  Hep C  -Further workup/treatment deferred to the outpatient setting   Hx of GIB -Hgb stable -EGD 05/11/15 by Dr. Laurence Spates was negative. Colonoscopy 06/22/13 by Dr. Watt Climes without signs of GI bleeding   Discharge Instructions      Discharge Instructions    AMB Referral to [facility]    Complete by:  As directed   Please assign to [place] for transition of care. History of CHF, DM. [facility] LCSW for social concerns and community resources. [facility] Pharmacist for trouble affording medications. Goes to [facility] on [address].  Please see notes. Written consent signed. Please call with questions. Thanks. [name], [place], RN,BSN [facility] [id number]  Reason for consult:  Please assign to [facility] RNCM, [facility] LCSW, and [facility] Pharmacist  Diagnoses of:   Heart Failure Diabetes    Expected date of contact:  1-3 days (reserved for hospital discharges)            Medication List    STOP taking these medications        promethazine  12.5 MG tablet  Commonly known as:  PHENERGAN        TAKE these medications        acetaminophen 325 MG tablet  Commonly known as:  TYLENOL  Take 2 tablets (650 mg total) by mouth every 6 (six) hours as needed for mild pain (or Fever >/= 101).     amLODipine 10 MG tablet  Commonly known as:  NORVASC  Take 1 tablet (10 mg total) by mouth at bedtime.     atorvastatin 40 MG tablet  Commonly known as:  LIPITOR  Take 1 tablet (40 mg total) by mouth at bedtime.     carvedilol 3.125 MG tablet  Commonly known as:  COREG  Take 1 tablet (3.125 mg total) by mouth 2 (two) times daily with a meal. Hold if HR <60     cloNIDine 0.1 MG tablet  Commonly known as:  CATAPRES  Take 1 tablet (0.1 mg total) by mouth every 8 (eight) hours.     diphenhydrAMINE 25 mg capsule  Commonly known as:  BENADRYL  Take 1 capsule (25 mg total) by mouth every 6 (six) hours as needed for itching.     insulin aspart 100 UNIT/ML injection  Commonly known as:  novoLOG  Inject 0-12 Units into the skin 3 (three) times daily before meals. Per sliding scale inject SQ <150=0units, 151-200=2 units, 201-250=4units, 251-300=6units,301-350=8 units, 351-400=10 units, >/= 401=12 units     insulin glargine 100 UNIT/ML injection  Commonly known as:  LANTUS  Inject 0.12 mLs (12 Units total) into the skin at bedtime.     LORazepam 0.5 MG tablet  Commonly known as:  ATIVAN  Take 0.5 mg by mouth See admin instructions. Take 1 tablet (0.5 mg) by mouth prior to dialysis and at bedtime as needed for sleep     Oxycodone HCl 10 MG Tabs  Take 1 tablet (10 mg total) by mouth every 6 (six) hours as needed (pain).     polyethylene glycol packet  Commonly known as:  MIRALAX / GLYCOLAX  Mix 1 capful (17 GM) in 8 oz of water and drink by mouth twice daily for constipation.     polyvinyl alcohol 1.4 % ophthalmic solution  Commonly known as:  LIQUIFILM TEARS  Place 2 drops into both eyes every 6 (six) hours as needed for dry eyes.     sevelamer carbonate 800 MG tablet  Commonly known  as:  RENVELA  Take 3 tablets (2,400 mg total) by mouth 3 (three) times daily with meals.        Allergies  Allergen Reactions  . Gabapentin Itching and Swelling  . Lyrica [Pregabalin] Swelling    Facial and leg swelling  . Other     NO BLOOD PRODUCTS. PT IS A JEHOVAH WITNESS  . Penicillins Hives    Has patient had a PCN reaction causing immediate rash, facial/tongue/throat swelling, SOB or lightheadedness with hypotension: Yes Has patient had a PCN reaction causing severe rash involving mucus membranes or skin necrosis: No Has patient had a PCN reaction that required hospitalization No Has patient had a PCN reaction occurring within the last 10 years: No If all of the above answers are "NO", then may proceed with Cephalosporin use. Pt tolerates cefepime and ceftriaxone  . Saphris [Asenapine] Swelling    Facial swelling  . Tramadol Swelling    Leg swelling    Consultations:  Nephrology   Procedures/Studies: Dg Chest 2 View  01/15/2016  CLINICAL DATA:  Followup for  pulmonary edema. EXAM: CHEST  2 VIEW COMPARISON:  01/14/2016 pain 01/13/2016. FINDINGS: Mild enlargement of the cardiopericardial silhouette. No mediastinal or hilar masses or evidence of adenopathy. Interstitial thickening has improved over the last 2 days. There is persistent lung base opacity most evident in the right lower lobe, which partly silhouettes the posterior right hemidiaphragm. There is a probable minimal right pleural effusion. No new lung opacities.  No pneumothorax. IMPRESSION: 1. Findings are consistent with improved pulmonary edema. 2. Residual opacity the right lower lobe is most likely atelectasis combination with a minimal effusion. Pneumonia is possible but felt less likely. Electronically Signed   By: Lajean Manes M.D.   On: 01/15/2016 20:26   Dg Chest 2 View  01/13/2016  CLINICAL DATA:  50 year old female with shortness of breath and chest pain EXAM: CHEST  2 VIEW COMPARISON:  Chest radiograph  dated 12/25/2015 FINDINGS: Two views of the chest demonstrate mild cardiomegaly with increased vascular and interstitial prominence compatible with congestive changes/edema. An area of increased density at the right lung base is concerning for superimposed pneumonia. Clinical correlation is recommended. Trace pleural effusion noted along the right major fissure. There is no pneumothorax. No acute osseous pathology. IMPRESSION: Mild cardiomegaly with cough congestive changes and edema. Right lung base hazy densities concerning for superimposed pneumonia. Clinical correlation and follow-up is recommended. Electronically Signed   By: Anner Crete M.D.   On: 01/13/2016 20:02   Dg Chest Port 1 View  01/14/2016  CLINICAL DATA:  followup of pulmonary edema. EXAM: PORTABLE CHEST 1 VIEW COMPARISON:  1 day prior FINDINGS: Midline trachea. Cardiomegaly accentuated by AP portable technique. Numerous leads and wires project over the chest. No pleural effusion or pneumothorax. Mild interstitial edema is similar, given differences in technique. The previously described right base airspace disease is less distinct today. There may be mild bibasilar atelectasis. IMPRESSION: Similar mild congestive heart failure. Electronically Signed   By: Abigail Miyamoto M.D.   On: 01/14/2016 15:46   Dg Chest Port 1 View  12/25/2015  CLINICAL DATA:  Missed dialysis.  Shortness of breath. EXAM: PORTABLE CHEST 1 VIEW COMPARISON:  11/22/2015. FINDINGS: Cardiomegaly with diffuse bilateral pulmonary infiltrates consistent pulmonary edema. No prominent pleural effusion. No pneumothorax. Pneumothorax. IMPRESSION: Congestive heart failure with bilateral pulmonary edema. Electronically Signed   By: Marcello Moores  Register   On: 12/25/2015 08:10       Discharge Exam: Filed Vitals:   01/16/16 0756 01/16/16 1430  BP: 159/70 150/60  Pulse: 71   Temp: 98 F (36.7 C)   Resp: 17    Filed Vitals:   01/15/16 2011 01/16/16 0510 01/16/16 0756 01/16/16  1430  BP: 157/72 136/68 159/70 150/60  Pulse: 79 75 71   Temp: 99 F (37.2 C) 98.2 F (36.8 C) 98 F (36.7 C)   TempSrc: Oral Oral Oral   Resp: 18 18 17    Weight: 73.982 kg (163 lb 1.6 oz)     SpO2: 95% 100% 95%     General: Pt is alert, awake, not in acute distress Cardiovascular: RRR, S1/S2 +, no rubs, no gallops Respiratory: Bibasilar crackles Abdominal: Soft, NT, ND, bowel sounds + Extremities: no edema, no cyanosis   The results of significant diagnostics from this hospitalization (including imaging, microbiology, ancillary and laboratory) are listed below for reference.    Significant Diagnostic Studies: Dg Chest 2 View  01/15/2016  CLINICAL DATA:  Followup for pulmonary edema. EXAM: CHEST  2 VIEW COMPARISON:  01/14/2016 pain 01/13/2016. FINDINGS: Mild enlargement of  the cardiopericardial silhouette. No mediastinal or hilar masses or evidence of adenopathy. Interstitial thickening has improved over the last 2 days. There is persistent lung base opacity most evident in the right lower lobe, which partly silhouettes the posterior right hemidiaphragm. There is a probable minimal right pleural effusion. No new lung opacities.  No pneumothorax. IMPRESSION: 1. Findings are consistent with improved pulmonary edema. 2. Residual opacity the right lower lobe is most likely atelectasis combination with a minimal effusion. Pneumonia is possible but felt less likely. Electronically Signed   By: Lajean Manes M.D.   On: 01/15/2016 20:26   Dg Chest 2 View  01/13/2016  CLINICAL DATA:  50 year old female with shortness of breath and chest pain EXAM: CHEST  2 VIEW COMPARISON:  Chest radiograph dated 12/25/2015 FINDINGS: Two views of the chest demonstrate mild cardiomegaly with increased vascular and interstitial prominence compatible with congestive changes/edema. An area of increased density at the right lung base is concerning for superimposed pneumonia. Clinical correlation is recommended. Trace  pleural effusion noted along the right major fissure. There is no pneumothorax. No acute osseous pathology. IMPRESSION: Mild cardiomegaly with cough congestive changes and edema. Right lung base hazy densities concerning for superimposed pneumonia. Clinical correlation and follow-up is recommended. Electronically Signed   By: Anner Crete M.D.   On: 01/13/2016 20:02   Dg Chest Port 1 View  01/14/2016  CLINICAL DATA:  followup of pulmonary edema. EXAM: PORTABLE CHEST 1 VIEW COMPARISON:  1 day prior FINDINGS: Midline trachea. Cardiomegaly accentuated by AP portable technique. Numerous leads and wires project over the chest. No pleural effusion or pneumothorax. Mild interstitial edema is similar, given differences in technique. The previously described right base airspace disease is less distinct today. There may be mild bibasilar atelectasis. IMPRESSION: Similar mild congestive heart failure. Electronically Signed   By: Abigail Miyamoto M.D.   On: 01/14/2016 15:46   Dg Chest Port 1 View  12/25/2015  CLINICAL DATA:  Missed dialysis.  Shortness of breath. EXAM: PORTABLE CHEST 1 VIEW COMPARISON:  11/22/2015. FINDINGS: Cardiomegaly with diffuse bilateral pulmonary infiltrates consistent pulmonary edema. No prominent pleural effusion. No pneumothorax. Pneumothorax. IMPRESSION: Congestive heart failure with bilateral pulmonary edema. Electronically Signed   By: Marcello Moores  Register   On: 12/25/2015 08:10     Microbiology: Recent Results (from the past 240 hour(s))  Culture, blood (routine x 2) Call MD if unable to obtain prior to antibiotics being given     Status: None (Preliminary result)   Collection Time: 01/14/16 12:13 AM  Result Value Ref Range Status   Specimen Description BLOOD RIGHT HAND  Final   Special Requests IN PEDIATRIC BOTTLE [place]  Final   Culture NO GROWTH 1 DAY  Final   Report Status PENDING  Incomplete  Culture, blood (routine x 2) Call MD if unable to obtain prior to antibiotics being given      Status: None (Preliminary result)   Collection Time: 01/14/16 12:24 AM  Result Value Ref Range Status   Specimen Description BLOOD RIGHT HAND  Final   Special Requests IN PEDIATRIC BOTTLE 3CC  Final   Culture NO GROWTH 1 DAY  Final   Report Status PENDING  Incomplete  MRSA PCR Screening     Status: Abnormal   Collection Time: 01/14/16  1:55 AM  Result Value Ref Range Status   MRSA by PCR (A) NEGATIVE Final    INVALID, UNABLE TO DETERMINE THE PRESENCE OF TARGET DNA DUE TO SPECIMEN INTEGRITY. RECOLLECTION REQUESTED.  Comment:        The GeneXpert MRSA Assay (FDA approved for NASAL specimens only), is one component of a comprehensive MRSA colonization surveillance program. It is not intended to diagnose MRSA infection nor to guide or monitor treatment for MRSA infections. CALLED ALBERT RIO ON 01/14/16 AT 60 BY [facility]        The GeneXpert MRSA Assay (FDA approved for NASAL specimens only), is one component of a comprehensive MRSA colonization surveillance program. It is not intended to diagnose MRSA infection nor to guide or monitor treatment for MRSA infections.   MRSA PCR Screening     Status: None   Collection Time: 01/14/16  4:38 PM  Result Value Ref Range Status   MRSA by PCR NEGATIVE NEGATIVE Final    Comment:        The GeneXpert MRSA Assay (FDA approved for NASAL specimens only), is one component of a comprehensive MRSA colonization surveillance program. It is not intended to diagnose MRSA infection nor to guide or monitor treatment for MRSA infections.      Labs: Basic Metabolic Panel:  Recent Labs Lab 01/13/16 1812 01/14/16 0013 01/14/16 0751 01/15/16 0745  NA 134* 135  --  133*  K 5.4* 4.7  --  4.4  CL 95* 97*  --  97*  CO2 22 21*  --  25  GLUCOSE 230* 93  --  219*  BUN 119* 121*  --  39*  CREATININE 14.72* 15.08*  --  7.46*  CALCIUM 8.2* 8.5*  --  8.5*  PHOS  --   --  6.2* 4.9*   Liver Function Tests:  Recent Labs Lab 01/15/16 0745   ALBUMIN 2.8*   No results for input(s): LIPASE, AMYLASE in the last 168 hours. No results for input(s): AMMONIA in the last 168 hours. CBC:  Recent Labs Lab 01/13/16 1812 01/14/16 0013 01/15/16 0745  WBC 2.2* 2.8* 2.4*  NEUTROABS  --   --  1.2*  HGB 8.4* 8.2* 8.4*  HCT 26.5* 26.1* 26.9*  MCV 84.9 85.6 87.1  PLT 99* 104* 99*   Cardiac Enzymes: No results for input(s): CKTOTAL, CKMB, CKMBINDEX, TROPONINI in the last 168 hours. BNP: Invalid input(s): POCBNP CBG:  Recent Labs Lab 01/15/16 1240 01/15/16 1732 01/15/16 2009 01/16/16 0754 01/16/16 1141  GLUCAP 129* 214* 140* 240* 190*    Time coordinating discharge:  Greater than 30 minutes  Signed:  Jymir Dunaj, DO [facility] Pager: [phone] 01/16/2016, 3:46 PM

## 2016-01-16 NOTE — Consult Note (Signed)
   [facility] CM Inpatient Consult   01/16/2016  [name] Oct 17, 1965 [id number]   Received call from Deanne Coffer, [facility] Pharmacist. Made aware that [place] Management will be able to assist patient with paying for her medications upon hospital discharge. Patient indicated yesterday that she will not be able to afford her medications. [name] stated she will not be able to afford her medications until next month. She reported she used up all of her money to move in with her son after her recent discharge from SNF. Call made to inpatient RNCM, [facility], to make aware that prescriptions will be paid for by [place] Management. Asked that prescriptions please be sent to [place]. Will follow up with [name] to make her aware of this. For further questions or concerns please contact:  [name], [place], RN,BSN [facility] Liaison [phone]

## 2016-01-16 NOTE — Progress Notes (Signed)
Inpatient Diabetes Program Recommendations  AACE/ADA: New Consensus Statement on Inpatient Glycemic Control (2015)  Target Ranges:  Prepandial:   less than 140 mg/dL      Peak postprandial:   less than 180 mg/dL (1-2 hours)      Critically ill patients:  140 - 180 mg/dL   Lab Results  Component Value Date   GLUCAP 240* 01/16/2016   HGBA1C 9.4 12/13/2015    Review of Glycemic Control  Diabetes history: DM 2 Outpatient Diabetes medications: Lantus 12 units, Novolog 0-12 units TID Current orders for Inpatient glycemic control: Lantus 5 units QHS, Novolog Moderate + HS scale  Inpatient Diabetes Program Recommendations: Insulin - Basal: Fasting glucose 240 mg/dl this am. Patient takes Lantus 12 units at home. Due to patient's finances, may want to go ahead and convert patient from Lantus 12 units to equivalent 70/30 dose 9 units BID while inpatient.   Thanks,  Tama Headings RN, MSN, [facility] Inpatient Diabetes Coordinator Team Pager [phone] (8a-5p)

## 2016-01-16 NOTE — Consult Note (Signed)
   [facility] CM Inpatient Consult   01/16/2016  [name] 30-Mar-1966 [id number]   [facility] Care Management follow up-- spoke with [name] at bedside to make aware that [place] Management will pay for her medications post hospital discharge. Made her aware that the medications will be at [facility]. [name] expressed appreciation of this. Spoke with Dr. Carles Collet to ask that patient's prescriptions please be sent to [place]. Made aware that discharge will be for tomorrow. Spoke with patient's nurse, inpatient RNCM, and inpatient Licensed CSW of the above. Made patient aware that [place] Management cannot pay for her narcotics however.   [name], MSN-Ed, RN,BSN [facility] Liaison [phone]

## 2016-01-16 NOTE — Evaluation (Signed)
Physical Therapy Evaluation Patient Details Name: [name] MRN: [id number] DOB: [birth year] Today's Date: 01/16/2016   History of Present Illness  [name] has a PMH of bipolar disorder/schizoaffective, Jehovah's Witness (has consented to blood transfusion this admission), HLD, DM 2/IDDM, hepatitis C, chronic pain syndrome, ESRD on TTS HD, noncompliant with HD (missed past week), seizure disorder, chronic combined CHF, anemia, HTN, TIA/stroke, substance abuse/cocaine, presented with increasing sob and N/V. The patient states that she was discharged from the nursing home on 01/09/2016. Since that time, she has not been able to arrange transportation to get to dialysis. She also states has not taken any of her medications due to financial difficulty. It was verified that the patient had Medicare and Medicaid through care management. As a result, no further assistance was able to be provided at this time. The patient was educated regarding getting her insulin through [name]   Pt admitted with above diagnosis. Pt currently with functional limitations due to the deficits listed below (see PT Problem List).  Pt will benefit from skilled PT to increase their independence and safety with mobility to allow discharge to the venue listed below.       Follow Up Recommendations Home health PT;Supervision - Intermittent    Equipment Recommendations  Rolling walker with 5" wheels    Recommendations for Other Services       Precautions / Restrictions        Mobility  Bed Mobility Overal bed mobility: Independent                Transfers Overall transfer level: Independent                  Ambulation/Gait Ambulation/Gait assistance: Supervision Ambulation Distance (Feet): 200 Feet Assistive device:  (pushing Dinamap) Gait Pattern/deviations: Step-through pattern   Gait velocity interpretation: Below normal speed for age/gender General Gait Details:  Cues to self-monitor for activity tolerance; Walked on Room air and O2 sats remained greater than or equal to 94%  Stairs            Wheelchair Mobility    Modified Rankin (Stroke Patients Only)       Balance Overall balance assessment: Needs assistance           Standing balance-Leahy Scale: Fair Standing balance comment: Benefited from UE suport with walking by pushing Dinamap                             Pertinent Vitals/Pain Pain Assessment: No/denies pain    Home Living Family/patient expects to be discharged to:: Private residence Living Arrangements: Children (Son) Available Help at Discharge: Available PRN/intermittently Type of Home: House Home Access: Stairs to enter Entrance Stairs-Rails: Can reach both Entrance Stairs-Number of Steps: 5 Home Layout: One level Home Equipment: Cane - single point;Walker - 2 wheels      Prior Function Level of Independence: Independent with assistive device(s)         Comments: walks with a cane     Hand Dominance   Dominant Hand: Left    Extremity/Trunk Assessment   Upper Extremity Assessment: Overall WFL for tasks assessed           Lower Extremity Assessment: Overall WFL for tasks assessed         Communication   Communication: No difficulties  Cognition Arousal/Alertness: Awake/alert Behavior During Therapy: WFL for tasks assessed/performed Overall Cognitive Status: Within Functional Limits for tasks  assessed (for simple mobility activities)                      General Comments General comments (skin integrity, edema, etc.): Pt expressed concern that she feels fatigued, tired and short-winded    Exercises        Assessment/Plan    PT Assessment Patient needs continued PT services  PT Diagnosis Generalized weakness;Other (comment) (Fatigue, Decr functional capacity)   PT Problem List Decreased activity tolerance;Decreased balance  PT Treatment Interventions DME  instruction;Gait training;Stair training;Functional mobility training;Therapeutic activities;Therapeutic exercise;Patient/family education   PT Goals (Current goals can be found in the Care Plan section) Acute Rehab PT Goals Patient Stated Goal: Hopes to be home early in the day tomorrow PT Goal Formulation: With patient Time For Goal Achievement: 01/23/16 Potential to Achieve Goals: Good    Frequency Min 3X/week   Barriers to discharge        Co-evaluation               End of Session Equipment Utilized During Treatment: Gait belt Activity Tolerance: Patient tolerated treatment well Patient left: in bed;with call Persad/phone within reach Nurse Communication: Mobility status         Time: 1420-1430 PT Time Calculation (min) (ACUTE ONLY): 10 min   Charges:   PT Evaluation $PT Eval Low Complexity: 1 Procedure     PT G CodesQuin Hoop 01/16/2016, 4:09 PM  Roney Marion, PT  Acute Rehabilitation Services Pager [phone] Office [phone]

## 2016-01-17 ENCOUNTER — Other Ambulatory Visit: Payer: Self-pay | Admitting: *Deleted

## 2016-01-17 ENCOUNTER — Other Ambulatory Visit: Payer: Self-pay

## 2016-01-17 DIAGNOSIS — Z992 Dependence on renal dialysis: Secondary | ICD-10-CM

## 2016-01-17 DIAGNOSIS — I5042 Chronic combined systolic (congestive) and diastolic (congestive) heart failure: Secondary | ICD-10-CM

## 2016-01-17 DIAGNOSIS — J810 Acute pulmonary edema: Secondary | ICD-10-CM

## 2016-01-17 LAB — RENAL FUNCTION PANEL
Albumin: 2.7 g/dL — ABNORMAL LOW (ref 3.5–5.0)
Anion gap: 10 (ref 5–15)
BUN: 36 mg/dL — ABNORMAL HIGH (ref 6–20)
CO2: 29 mmol/L (ref 22–32)
Calcium: 8.8 mg/dL — ABNORMAL LOW (ref 8.9–10.3)
Chloride: 95 mmol/L — ABNORMAL LOW (ref 101–111)
Creatinine, Ser: 6.93 mg/dL — ABNORMAL HIGH (ref 0.44–1.00)
GFR calc Af Amer: 7 mL/min — ABNORMAL LOW (ref >60)
GFR calc non Af Amer: 6 mL/min — ABNORMAL LOW (ref >60)
Glucose, Bld: 136 mg/dL — ABNORMAL HIGH (ref 65–99)
Phosphorus: 5.4 mg/dL — ABNORMAL HIGH (ref 2.5–4.6)
Potassium: 4.2 mmol/L (ref 3.5–5.1)
Sodium: 134 mmol/L — ABNORMAL LOW (ref 135–145)

## 2016-01-17 LAB — CBC
HCT: 29.4 % — ABNORMAL LOW (ref 36.0–46.0)
Hemoglobin: 9.1 g/dL — ABNORMAL LOW (ref 12.0–15.0)
MCH: 27.5 pg (ref 26.0–34.0)
MCHC: 31 g/dL (ref 30.0–36.0)
MCV: 88.8 fL (ref 78.0–100.0)
Platelets: 113 K/uL — ABNORMAL LOW (ref 150–400)
RBC: 3.31 MIL/uL — ABNORMAL LOW (ref 3.87–5.11)
RDW: 18.4 % — ABNORMAL HIGH (ref 11.5–15.5)
WBC: 2.8 10*3/uL — ABNORMAL LOW (ref 4.0–10.5)

## 2016-01-17 LAB — GLUCOSE, CAPILLARY
GLUCOSE-CAPILLARY: 160 mg/dL — AB (ref 65–99)
Glucose-Capillary: 167 mg/dL — ABNORMAL HIGH (ref 65–99)

## 2016-01-17 MED ORDER — LIDOCAINE-PRILOCAINE 2.5-2.5 % EX CREA: 1.0000 "application " | TOPICAL_CREAM | CUTANEOUS | Status: DC | PRN

## 2016-01-17 MED ORDER — PENTAFLUOROPROP-TETRAFLUOROETH EX AERO: 1.0000 "application " | INHALATION_SPRAY | CUTANEOUS | Status: DC | PRN

## 2016-01-17 MED ORDER — SODIUM CHLORIDE 0.9 % IV SOLN: 100.0000 mL | INTRAVENOUS | Status: DC | PRN

## 2016-01-17 MED ORDER — LIDOCAINE HCL (PF) 1 % IJ SOLN: 5.0000 mL | INTRAMUSCULAR | Status: DC | PRN

## 2016-01-17 MED FILL — ULTICARE SYR 0.3 ML 30GX5/1: 30G X 5/16" | 25 days supply | Qty: 100 | Fill #0

## 2016-01-17 MED FILL — NovoLOG 100 UNIT/ML SOLN: 100 | 27 days supply | Qty: 10 | Fill #0

## 2016-01-17 NOTE — Progress Notes (Signed)
   Patient seen during HD. No overnight issues. Discharge summary completed on 6/13. Vitals stable  on exam: NAD  chest: clear b/l  CVS: NS1&S2 ABD: soft, NT, ND  Acute pulmonary edema  in the setting of missed HD. counseled on adherence  Chronic pain  have asked her to schedule appt with PCP so that she can get short term pain meds and be referred to pain clinic  Had HD this morning. Stable for d/c

## 2016-01-17 NOTE — Progress Notes (Signed)
01/17/2016 12:35 PM  Reviewed discharge paperwork with patient, including her follow-up appointments/calls with [facility], her medications, discharge instructions, etc.  Provided patient with paper copy of her prescription for insulin, the rest have been sent electronically to the [facility], where the patient will be picking up her medications.  Pt verbalized understanding of where to go to obtain her medications and was encouraged to take them as prescribed, to which she verbalized understanding.  IV and telemetry removed.  Pt gathered her belongings in a bag, and is currently waiting on her lunch to come so she can eat before she leaves.  Pt son is at the bedside, who is taking the patient home.  Discharge instructions and prescription left with patient.    Princella Pellegrini

## 2016-01-17 NOTE — Progress Notes (Signed)
[facility] Progress Note   Subjective: alert, no distress, "i feel a lot better"  Objective Filed Vitals:   01/17/16 0930 01/17/16 1000 01/17/16 1030 01/17/16 1100  BP: 149/71 127/79 132/72 138/74  Pulse: 72 74 73 72  Temp:      TempSrc:      Resp:      Weight:      SpO2:       Physical Exam General: well nourished, NAD Heart: S1, S2, RRR  Lungs: Bilateral breath sounds few scattered crackles posteriorly.  Abdomen: soft, non-tender, active BS Extremities: No LE edema Dialysis Access: LFA AVG + bruit  Dialysis Orders: [facility] MWF 3:45 400/800 EDW 75 kg 2.0K/2.5 Ca No heparin Mircera 150 mcg IV q 2 weeks (last dose 01/03/16) Venofer 50 mg IV q week (last dose 01/03/16)   Additional Objective Labs: Basic Metabolic Panel:  Recent Labs Lab 01/14/16 0013 01/14/16 0751 01/15/16 0745 01/17/16 0537  NA 135  --  133* 134*  K 4.7  --  4.4 4.2  CL 97*  --  97* 95*  CO2 21*  --  25 29  GLUCOSE 93  --  219* 136*  BUN 121*  --  39* 36*  CREATININE 15.08*  --  7.46* 6.93*  CALCIUM 8.5*  --  8.5* 8.8*  PHOS  --  6.2* 4.9* 5.4*   Liver Function Tests:  Recent Labs Lab 01/15/16 0745 01/17/16 0537  ALBUMIN 2.8* 2.7*   No results for input(s): LIPASE, AMYLASE in the last 168 hours. CBC:  Recent Labs Lab 01/13/16 1812 01/14/16 0013 01/15/16 0745 01/17/16 0537  WBC 2.2* 2.8* 2.4* 2.8*  NEUTROABS  --   --  1.2*  --   HGB 8.4* 8.2* 8.4* 9.1*  HCT 26.5* 26.1* 26.9* 29.4*  MCV 84.9 85.6 87.1 88.8  PLT 99* 104* 99* 113*   Blood Culture    Component Value Date/Time   SDES BLOOD RIGHT HAND 01/14/2016 0024   SPECREQUEST IN PEDIATRIC BOTTLE 3CC 01/14/2016 0024   CULT NO GROWTH 2 DAYS 01/14/2016 0024   REPTSTATUS PENDING 01/14/2016 0024    Cardiac Enzymes: No results for input(s): CKTOTAL, CKMB, CKMBINDEX, TROPONINI in the last 168 hours. CBG:  Recent Labs Lab 01/15/16 2009 01/16/16 0754 01/16/16 1141 01/16/16 1657 01/16/16 2029  GLUCAP  140* 240* 190* 234* 160*   Iron Studies: No results for input(s): IRON, TIBC, TRANSFERRIN, FERRITIN in the last 72 hours. @lablastinr3 @ Studies/Results: Dg Chest 2 View  01/15/2016  CLINICAL DATA:  Followup for pulmonary edema. EXAM: CHEST  2 VIEW COMPARISON:  01/14/2016 pain 01/13/2016. FINDINGS: Mild enlargement of the cardiopericardial silhouette. No mediastinal or hilar masses or evidence of adenopathy. Interstitial thickening has improved over the last 2 days. There is persistent lung base opacity most evident in the right lower lobe, which partly silhouettes the posterior right hemidiaphragm. There is a probable minimal right pleural effusion. No new lung opacities.  No pneumothorax. IMPRESSION: 1. Findings are consistent with improved pulmonary edema. 2. Residual opacity the right lower lobe is most likely atelectasis combination with a minimal effusion. Pneumonia is possible but felt less likely. Electronically Signed   By: Lajean Manes M.D.   On: 01/15/2016 20:26   Medications:   . amLODipine  10 mg Oral QHS  . atorvastatin  40 mg Oral QHS  . carvedilol  3.125 mg Oral BID WC  . cloNIDine  0.1 mg Oral Q8H  . darbepoetin (ARANESP) injection - DIALYSIS  100 mcg Intravenous Q  Mon-HD  . feeding supplement (PRO-STAT SUGAR FREE 64)  30 mL Oral BID  . heparin  5,000 Units Subcutaneous Q12H  . insulin aspart  0-15 Units Subcutaneous TID WC  . insulin aspart  0-5 Units Subcutaneous QHS  . insulin glargine  10 Units Subcutaneous QHS  . multivitamin with minerals  1 tablet Oral Daily  . polyethylene glycol  17 g Oral Daily  . polyethylene glycol  17 g Oral Daily  . sevelamer carbonate  2,400 mg Oral TID WC  . sodium chloride flush  3 mL Intravenous Q12H   Assessment: 1. Vol excess /pulm edema: resolved, due to wt loss and/or poor compliance w HD and fluid restriction.  Lowering dry wt to 72.5kg.  For dc today after HD  2. ESRD -MWF @ [place] 3. Anemia - HGB 8.4 last ESA dose 01/15/16 Aranesp  100 mcg IV 4. HTN stable 5. DM: per primary.   Plan - dc after HD today   Kelly Splinter MD [place] pager [phone]    cell [phone] 01/17/2016, 11:24 AM

## 2016-01-17 NOTE — Patient Outreach (Signed)
Chart reviewed for hospital discharge. Patient discharged home on 01/17/2016. Patient will be outreached within 24-72 hours for transition of care.  Tish Men, RN, Copywriter, advertising, CCM

## 2016-01-18 ENCOUNTER — Other Ambulatory Visit: Payer: Self-pay

## 2016-01-18 NOTE — Patient Outreach (Signed)
Initial transition of care call made for assessment of care coordination needs. Patient identified herself by providing date of birth and address.  Patient consented to participating in this initial assessment. Patient requested no home visit, requested we meet at the post acute care discharge visit with her primary care physician later this month.  Patient asked this RNCM to call her Monday for information on the date and time of her appointment.  Findings during this home visit: Patient is on hemodialysis at center on [facility] on Mondays, Wednesdays and Fridays. Patient uses Medicaid Transportation for medical appointments. Patient lives in home visit her son and grandchildren. Patient states he son has a bad dog and she does not want home visit  Plan: Make telephone contact with patient Monday morning to confirms appointment date with her primary care physician.

## 2016-01-19 LAB — CULTURE, BLOOD (ROUTINE X 2)
CULTURE: NO GROWTH
Culture: NO GROWTH

## 2016-01-22 ENCOUNTER — Other Ambulatory Visit: Payer: Self-pay

## 2016-01-22 ENCOUNTER — Other Ambulatory Visit: Payer: Self-pay | Admitting: Licensed Clinical Social Worker

## 2016-01-22 NOTE — Patient Outreach (Signed)
[facility]) Care Management  01/22/2016  MAYNARD HOESE 1966/01/08 [id number]   Assessment- CSW completed outreach to patient after receiving referral from inpatient. Patient is in need of community resources. Per [facility] Liaison she does not wish for home visit because of a dog that they have. CSW completed outreach but was unable to reach her successfully. HIPPA compliant voice mess left for patient encouraging her to return call once available.  Plan-CSW will await for returned call or will complete additional outreach by 01/26/16.  Eula Fried, BSW, MSW, [email or web address] Phone: [phone] Fax: [phone]

## 2016-01-23 ENCOUNTER — Ambulatory Visit: Payer: Self-pay

## 2016-01-23 ENCOUNTER — Other Ambulatory Visit: Payer: Self-pay

## 2016-01-23 NOTE — Patient Outreach (Signed)
Telephone contact with patient to confirm home visit for later this month. Patient identified herself by providing her date of birth and address.  Patient confirmed visit to be held with this RNCM, however, patient has requested the meeting be held at her doctors office.  Patient states her son's dog is bad and she does not want anybody to come there.  Patient and this RNCM collaborated to form her case management care plan.   Patient reports she would like to work on knowing more about heart failure, fluid overload.  Plan: Meet with patient later this month at her doctors appointment.

## 2016-01-23 NOTE — Patient Outreach (Signed)
I called [name] to follow up on her medications and to determine if she was going to be able to afford her medications next month.  I had to leave a HIPPA compliant message for the patient to call me back.  If I do not hear back from the patient today, I will call the patient back at a later date.   Deanne Coffer, PharmD, [place] [phone]

## 2016-01-24 ENCOUNTER — Other Ambulatory Visit: Payer: Self-pay

## 2016-01-24 NOTE — Patient Outreach (Signed)
01/24/16  Subjective [name] is a 50 year old female who was referred to me by [name], RN, [facility] hospital liaison due to Ms. Baez not being able to afford her medications upon discharge. She was hospitalized from 01/13/16-01/17/16 with SOB/nausea/vomiting due to missing her dialysis for a week.  I was able to purchase her medications using the Pharmacy Emergency Fund.  Today I am calling her to see if she was able to get all of her medications and to see if she is going to be able to afford her medications next month.  She was getting her dialysis when I called and an alarm was beeping.  She stated she did pick up all her medications and she will be able to afford them next month when she gets her check.  She was really appreciative that we were able to help her out.    Objective Outpatient Encounter Prescriptions as of 01/24/2016  Medication Sig  . acetaminophen (TYLENOL) 325 MG tablet Take 2 tablets (650 mg total) by mouth every 6 (six) hours as needed for mild pain (or Fever >/= 101).  Marland Kitchen amLODipine (NORVASC) 10 MG tablet Take 1 tablet (10 mg total) by mouth at bedtime.  Marland Kitchen atorvastatin (LIPITOR) 40 MG tablet Take 1 tablet (40 mg total) by mouth at bedtime.  . carvedilol (COREG) 3.125 MG tablet Take 1 tablet (3.125 mg total) by mouth 2 (two) times daily with a meal. Hold if HR <60  . cloNIDine (CATAPRES) 0.1 MG tablet Take 1 tablet (0.1 mg total) by mouth every 8 (eight) hours.  . diphenhydrAMINE (BENADRYL) 25 mg capsule Take 1 capsule (25 mg total) by mouth every 6 (six) hours as needed for itching.  . insulin aspart (NOVOLOG) 100 UNIT/ML injection Inject 0-12 Units into the skin 3 (three) times daily before meals. Per sliding scale inject SQ <150=0units, 151-200=2 units, 201-250=4units, 251-300=6units,301-350=8 units, 351-400=10 units, >/= 401=12 units  . insulin glargine (LANTUS) 100 UNIT/ML injection Inject 0.12 mLs (12 Units total) into the skin at bedtime.  Marland Kitchen LORazepam (ATIVAN) 0.5 MG tablet  Take 0.5 mg by mouth See admin instructions. Take 1 tablet (0.5 mg) by mouth prior to dialysis and at bedtime as needed for sleep  . Oxycodone HCl 10 MG TABS Take 1 tablet (10 mg total) by mouth every 6 (six) hours as needed (pain).  . polyethylene glycol (MIRALAX / GLYCOLAX) packet Reported on 01/24/2016  . polyvinyl alcohol (LIQUIFILM TEARS) 1.4 % ophthalmic solution Place 2 drops into both eyes every 6 (six) hours as needed for dry eyes.   . sevelamer carbonate (RENVELA) 800 MG tablet Take 3 tablets (2,400 mg total) by mouth 3 (three) times daily with meals.   No facility-administered encounter medications on file as of 01/24/2016.   Assessment Patient was recently discharged from hospital and all medications have been reviewed.  Drugs sorted by system:  Neurologic/Psychologic: lorazepam  Cardiovascular: amlodipine, atorvastatin, carvedilol, clonidine  Pulmonary/Allergy: diphenhydramine  Gastrointestinal: none  Endocrine: Novolog  Renal: sevelamer carbonate  Topical: none  Pain: acetaminophen, oxycodone  Vitamins/Minerals: none  Infectious Diseases: none  Miscellaneous: liquifilm tears  Duplications in therapy: none Gaps in therapy: she is not on an ACE inhibitor or ARB currently; she was on one in 2015 but it was stopped due to rising creatinine.   Medications to avoid in the elderly: not applicable Drug interactions: none Other issues noted: none  Plan: 1.  Ms. Hoopingarner has all of her medication and no major issues around pharmacy.  She will be able to afford her medications next month.  I will close her to pharmacy since all of her pharmacy needs have been met.    Deanne Coffer, PharmD, [place] [phone]

## 2016-01-26 ENCOUNTER — Other Ambulatory Visit: Payer: Self-pay | Admitting: Licensed Clinical Social Worker

## 2016-01-26 NOTE — Patient Outreach (Signed)
[facility]) Care Management  01/26/2016  RANEE EICKHOFF [birth year] [id number]   Assessment-CSW completed second outreach attempt today. CSW unable to reach patient successfully. CSW left a HIPPA compliant voice message encouraging patient to return call once available.  Plan-CSW will await return call or complete an additional outreach if needed.  Eula Fried, BSW, MSW, [email or web address] Phone: [phone] Fax: [phone]

## 2016-01-30 ENCOUNTER — Other Ambulatory Visit: Payer: Self-pay | Admitting: Licensed Clinical Social Worker

## 2016-01-30 NOTE — Patient Outreach (Signed)
[facility]) Care Management  01/30/2016  [name] 1966-01-21 [id number]   Assessment- CSW completed third outreach attempt today and patient answered. CSW has previously worked with this patient. Patient answered phone and CSW reintroduced self and HIPPA verifications were provided. Patient reports that she has food stamps, stable transportation and stable housing. Patient continues to receive dialysis three days per week. Patient was educated on [place] work services. Patient states "I just want you to send me everything you have. I got a lot going on right now." Patient does not wish for CSW to complete home visit and review these resources as she lives with her son who has a dog that cannot be around people. Patient reports that this CSW can mail her out information. CSW took time educate patient on resources that will be mailed to her residence which include: mental health resources, housing resources, transportation resources, where to receive a free meal in the community, Marketing executive, senior resources, Retail banker for [employer] and financial resources. CSW spent a lot of time education patient on these resources even though patient has some of these already set up. Patient wishes to change mental health providers and CSW has a list of 8 pages of Mental Health agencies that accept Medicaid per [employer] site. Patient also stated that she had lost food pantry information and would benefit from this again. Patient reports that she lives in a stable home with her son but would eventually like to live by herself. CSW will mail her housing resources as well as the web site she can use to put in her budget and find nearby available residences for her to rent. Patient is appreciative of information. Patient is agreeable to contacting this CSW if she has any questions if regards to these resources.  Plan-CSW will not open case as patient does not wish for CSW to  complete home visits and prefers that resources be mailed to her residence only. CSW will mail out all of these resources on 02/02/16 and will discharge patient from caseload. CSW has updated RNCM.  Eula Fried, BSW, MSW, [email or web address] Phone: [phone] Fax: [phone]

## 2016-02-02 ENCOUNTER — Other Ambulatory Visit: Payer: Self-pay | Admitting: Licensed Clinical Social Worker

## 2016-02-02 NOTE — Patient Outreach (Signed)
[facility]) Care Management  02/02/2016  [name] January 10, 1966 [id number]   Assessment- CSW successfully mailed resources today to patient for her to review. CSW completed education on these resources over the phone.   Plan-CSW will not open case.  Eula Fried, BSW, MSW, [email or web address] Phone: [phone] Fax: [phone]

## 2016-02-09 ENCOUNTER — Other Ambulatory Visit: Payer: Self-pay

## 2016-02-09 NOTE — Patient Outreach (Signed)
Unsuccessful attempt made to contact patient via telephone for assessment of community care coordination needs. Will make attempt to contact patient next week to assess needs.

## 2016-02-12 ENCOUNTER — Other Ambulatory Visit: Payer: Self-pay

## 2016-02-18 IMAGING — US US RENAL
1 series · 14 of 25 positions shown · non-contrast
Comparison: Prior study from 07/06/2014

CLINICAL DATA: Initial evaluation for acute renal injury. History
of chronic stage III renal disease, hypertension, diabetes, hep C.

EXAM:
RENAL/URINARY TRACT ULTRASOUND COMPLETE

[Series 1: us renal · 0.18mm/px · 14 of 30 slices shown]
[im 1/30]
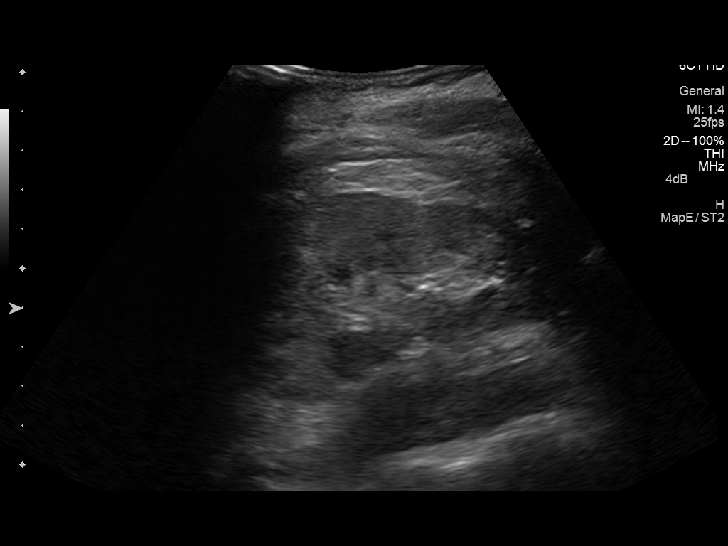
[im 3/30]
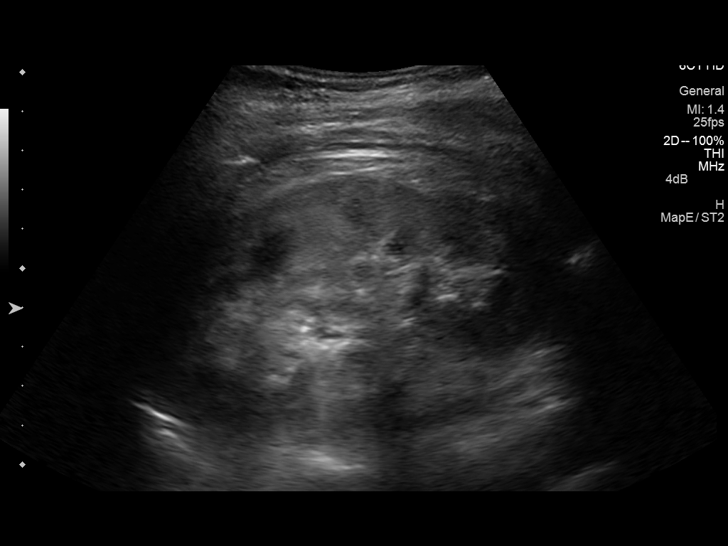
[im 5/30]
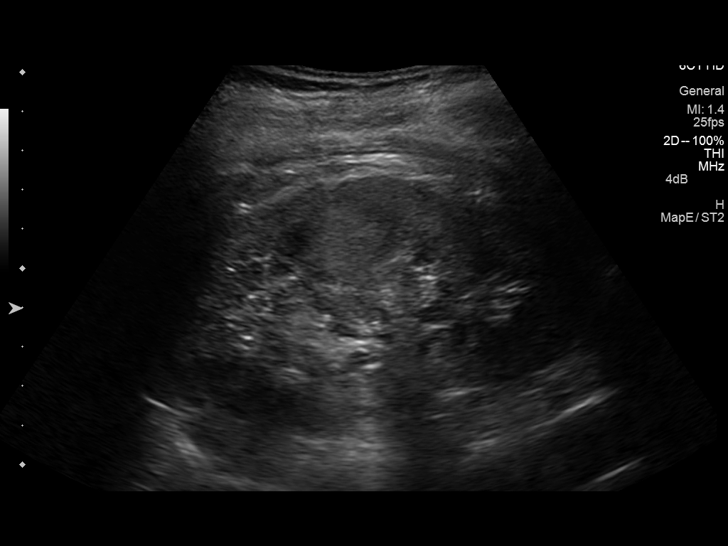
[im 8/30]
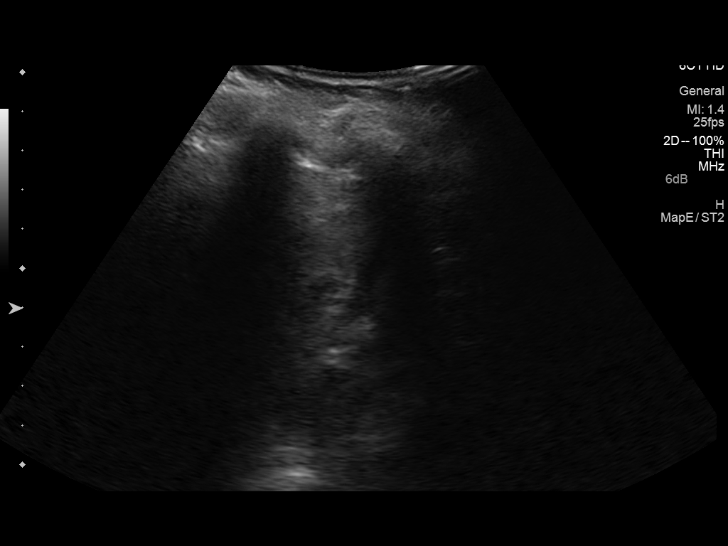
[im 10/30]
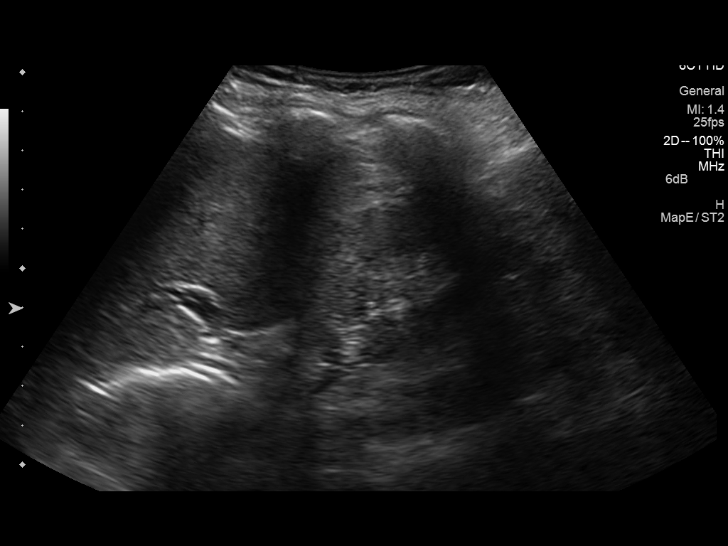
[im 11/30]
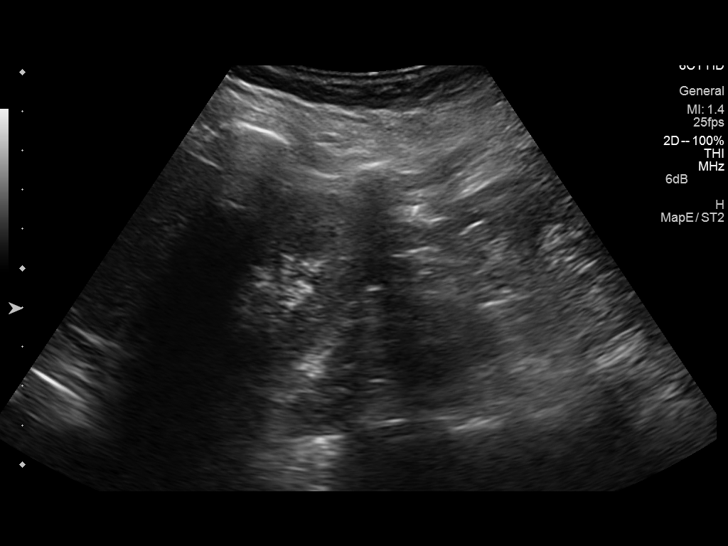
[im 14/30]
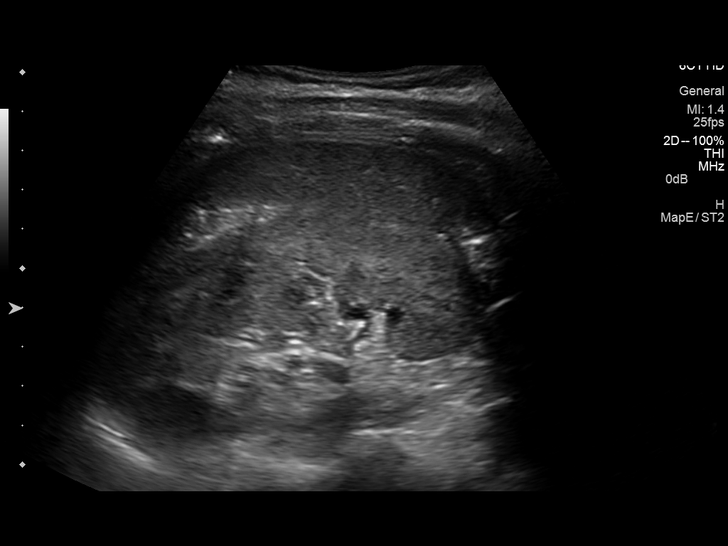
[im 16/30]
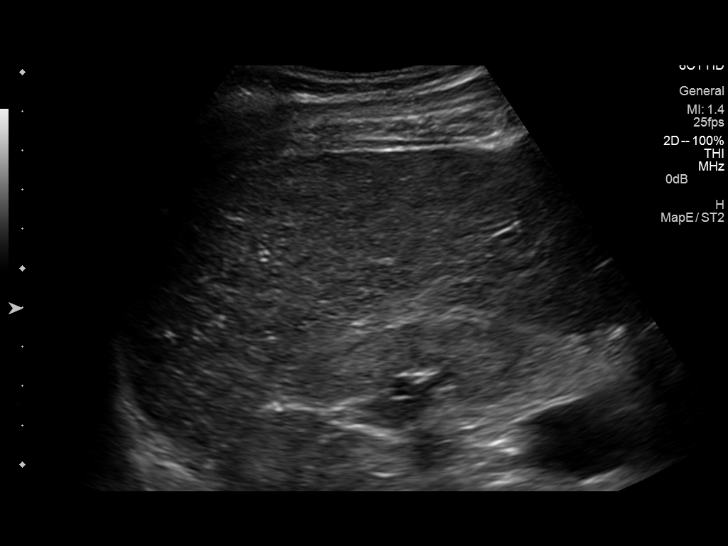
[im 19/30]
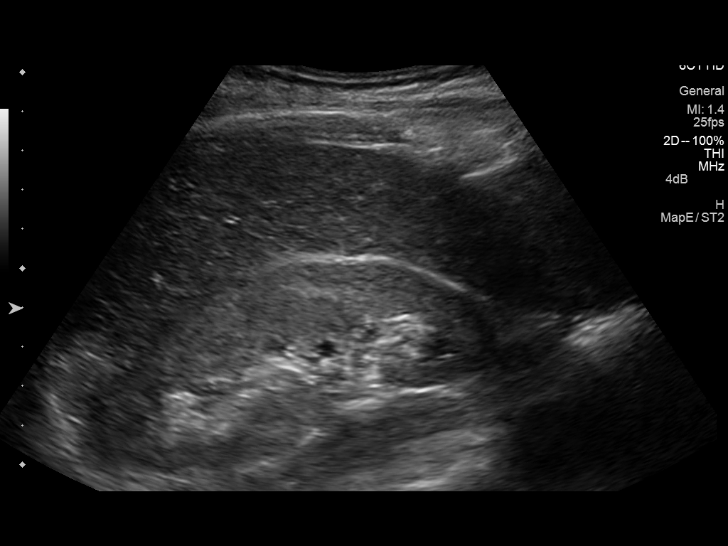
[im 20/30]
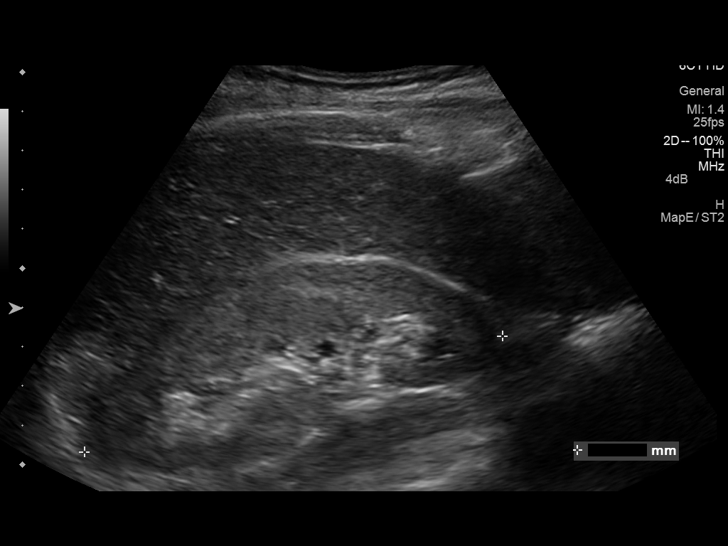
[im 22/30]
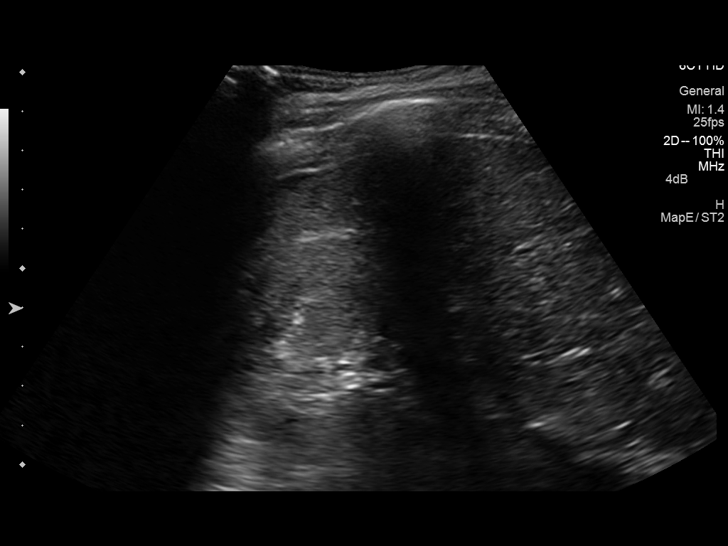
[im 25/30]
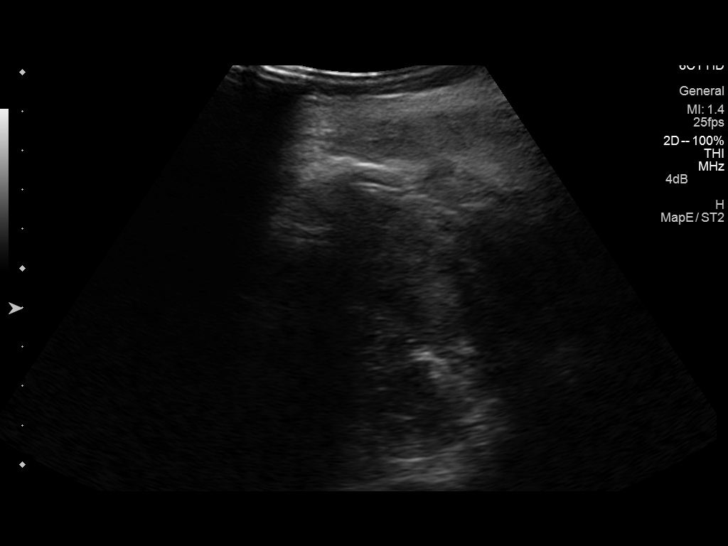
[im 27/30]
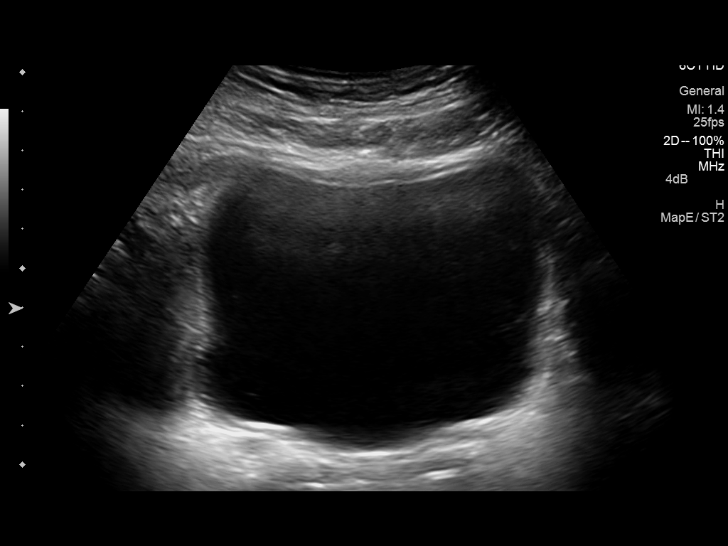
[im 30/30]
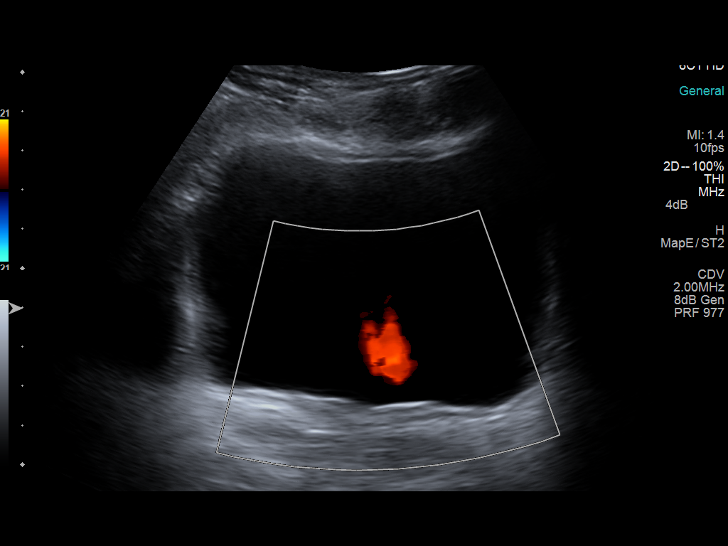

[14 of 25 positions shown; findings below may reference images not displayed]

FINDINGS: Right Kidney:

Length: 11.1 cm. Renal cortex diffusely echogenic in appearance,
suggesting underlying medical renal disease. No mass or
hydronephrosis visualized.

Left Kidney:

Length: 11.3 cm. Renal cortex diffusely echogenic in appearance,
suggesting underlying medical renal disease. No mass or
hydronephrosis visualized.

Bladder:

Appears normal for degree of bladder distention. Bilateral ureteral
jets present.
IMPRESSION: Echogenic kidneys bilaterally, suggesting underlying medical renal
disease. No hydronephrosis or other acute abnormality.

## 2016-02-19 IMAGING — CT CT HEAD W/O CM
1 series · 16 of 30 positions shown, 20 images · non-contrast
Comparison: 11/09/2014

CLINICAL DATA: Right leg weakness

EXAM:
CT HEAD WITHOUT CONTRAST
TECHNIQUE: Contiguous axial images were obtained from the base of the skull
through the vertex without intravenous contrast.

[Series 2: head 5.0 h30s · axial · 0.48mm/px · z∈[+1280,+1425]mm · 16 of 33 slices shown, 20 images]
[im 2/33  brain]
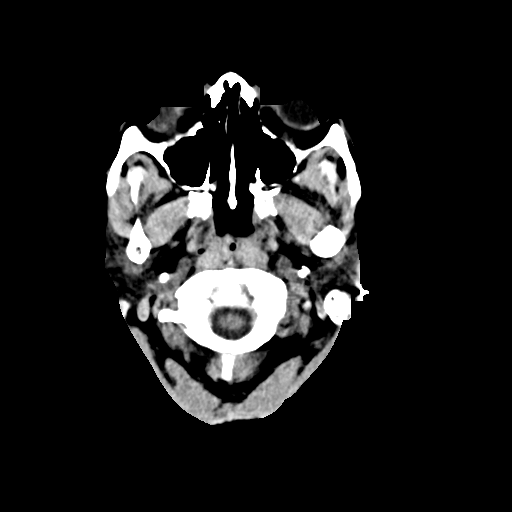
[im 2/33  bone]
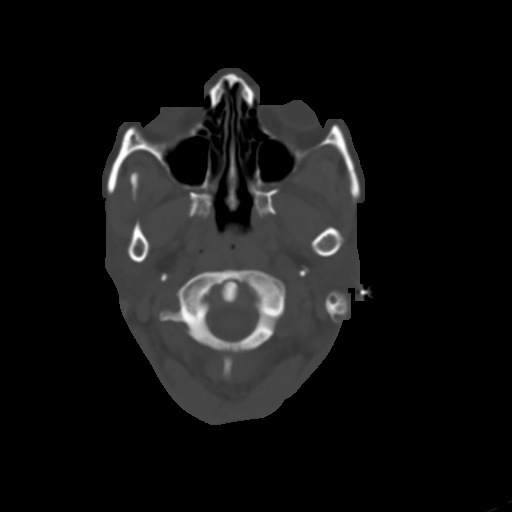
[im 4/33  brain]
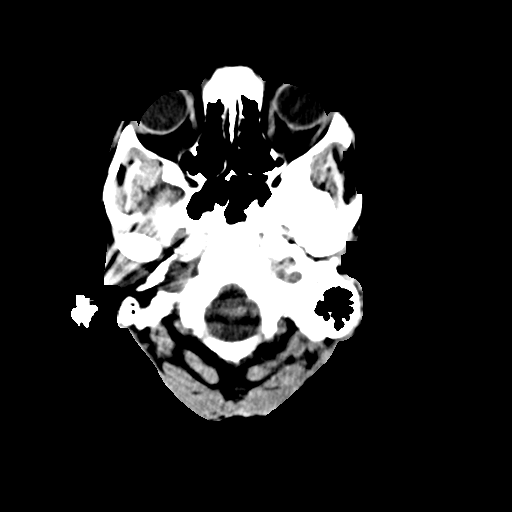
[im 6/33  brain]
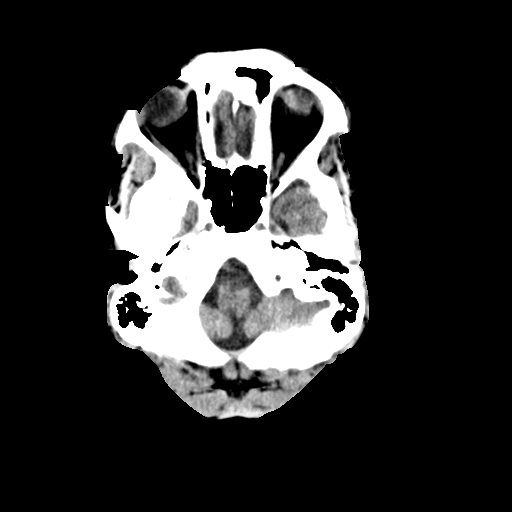
[im 8/33  brain]
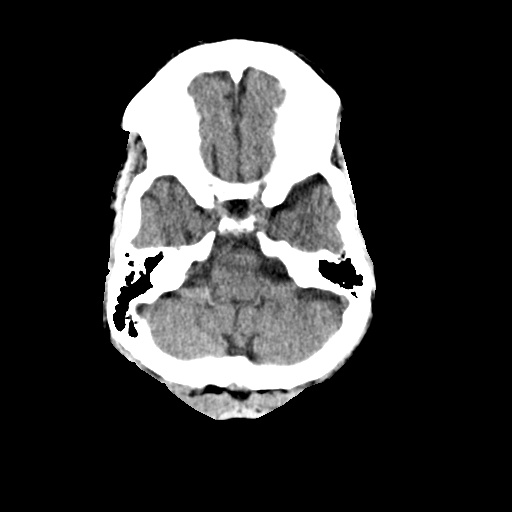
[im 9/33  brain]
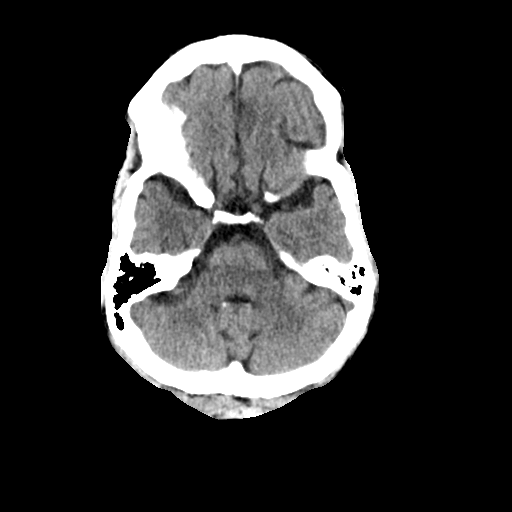
[im 9/33  bone]
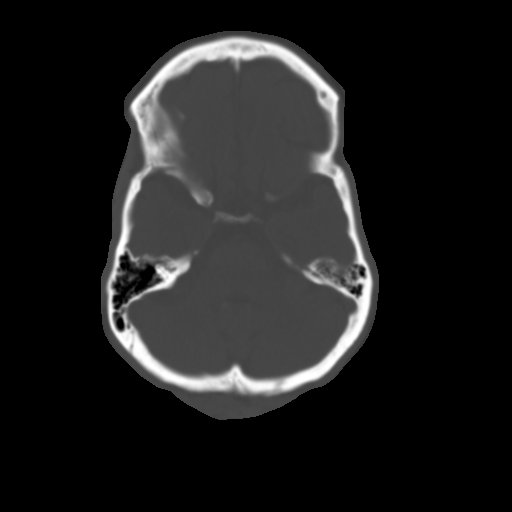
[im 12/33  brain]
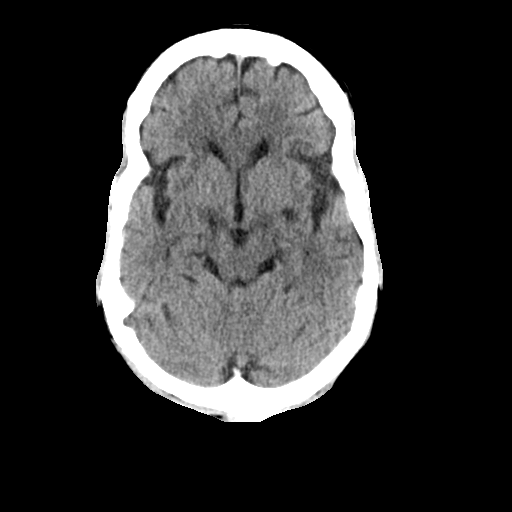
[im 14/33  brain]
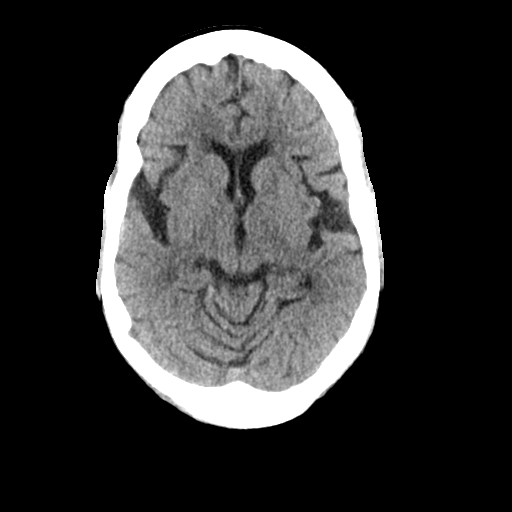
[im 16/33  brain]
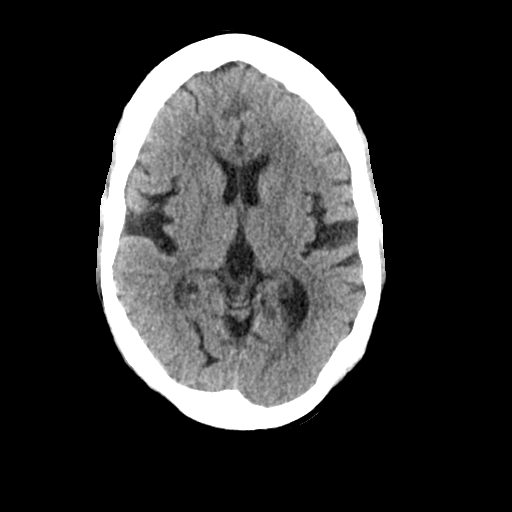
[im 17/33  brain]
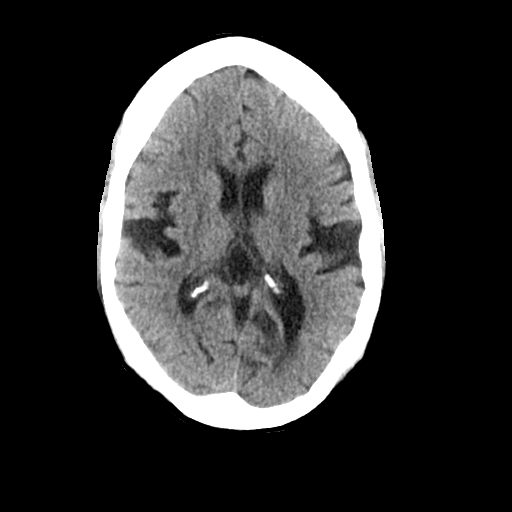
[im 17/33  bone]
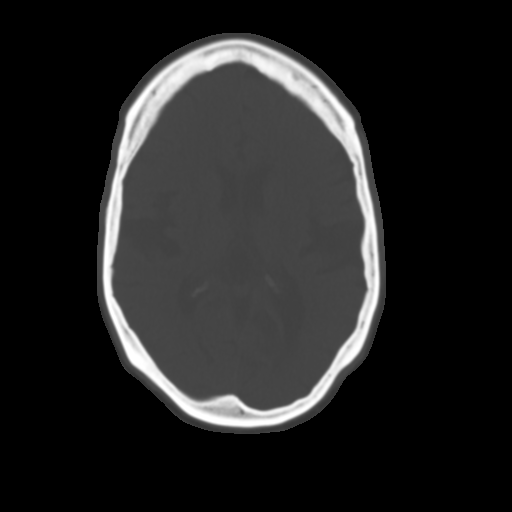
[im 19/33  brain]
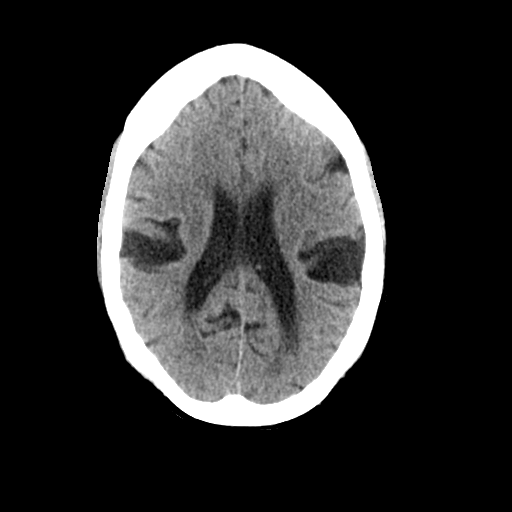
[im 21/33  brain]
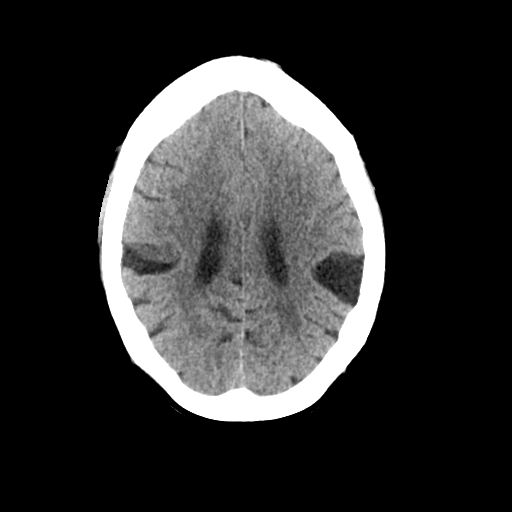
[im 24/33  brain]
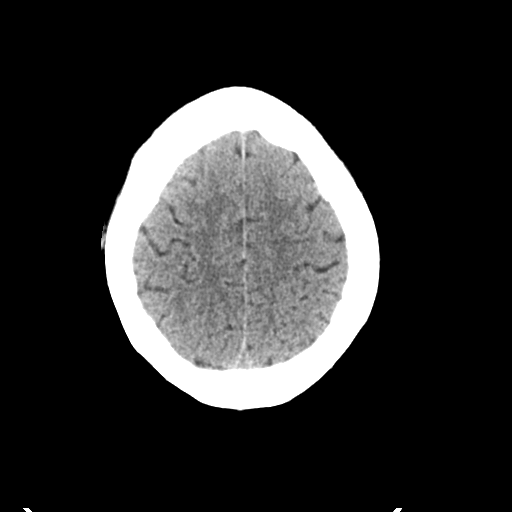
[im 25/33  brain]
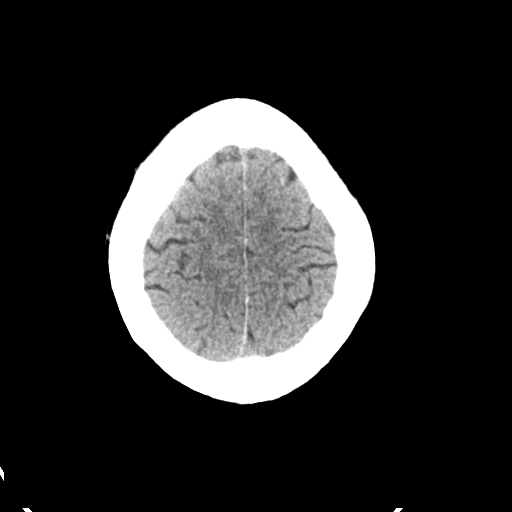
[im 25/33  bone]
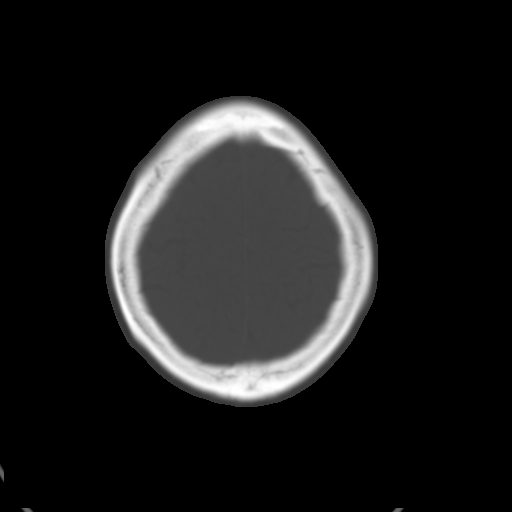
[im 27/33  brain]
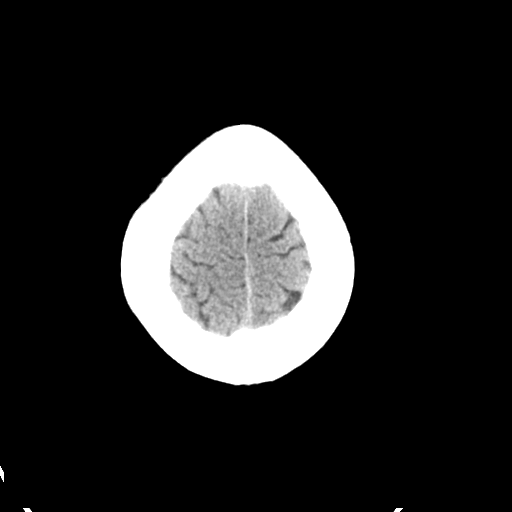
[im 29/33  brain]
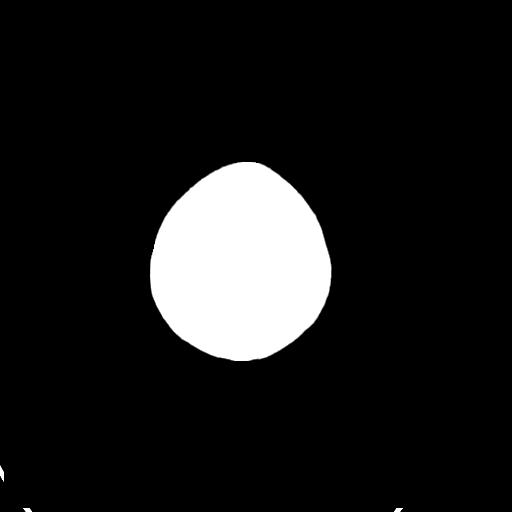
[im 31/33  brain]
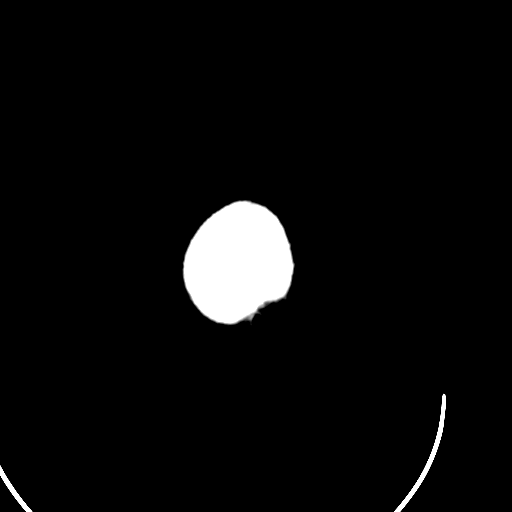

[16 of 30 positions shown; findings below may reference images not displayed]

FINDINGS: No skull fracture is noted. Paranasal sinuses and mastoid air cells
are unremarkable.

No intracranial hemorrhage, mass effect or midline shift. Again
noted old lacunar infarct in left basal ganglia.

Stable mild cerebral atrophy. Stable mild periventricular and patchy
subcortical chronic white matter disease. No definite acute cortical
infarction. No mass lesion is noted on this unenhanced scan.
IMPRESSION: No definite acute cortical infarction. Stable atrophy and chronic
mild white matter disease. Stable small old lacunar infarct in left
basal ganglia.

## 2016-02-19 IMAGING — MR MR HEAD W/O CM
9 of 11 series · 32 of 48 positions shown · non-contrast
Comparison: CT head 11/10/2014.  MR head 02/02/2014.

CLINICAL DATA: Seizure disorder. Nausea and vomiting. Prior history
of stroke. Initial encounter.

EXAM:
MRI HEAD WITHOUT CONTRAST
TECHNIQUE: Multiplanar, multiecho pulse sequences of the brain and surrounding
structures were obtained without intravenous contrast.

[Series 2: FLAIR · sagittal · 5.0mm · 0.47mm/px · 1 of 23 slices shown (1 of 2)]
[im 1/23]
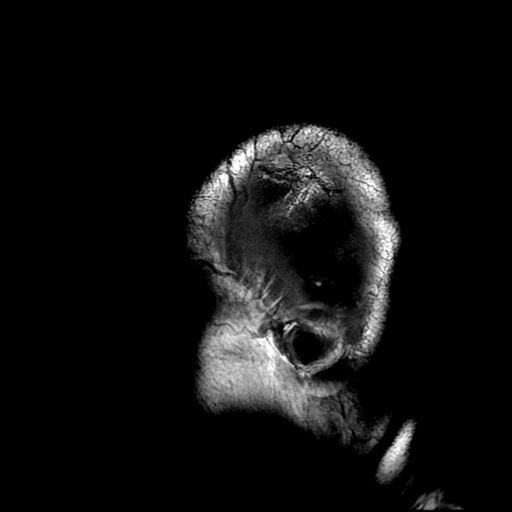

[Series 4: DWI · axial · 3.0mm · 0.94mm/px · z∈[-91,+43]mm · 7 of 96 slices shown (1 of 4)]
[im 1/96]
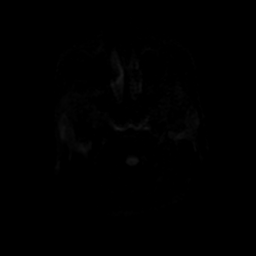
[im 16/96]
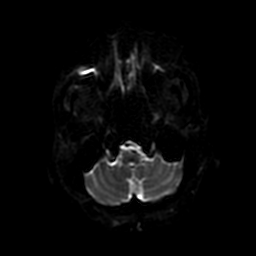
[im 32/96]
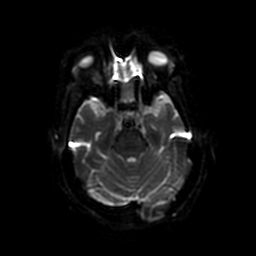
[im 48/96]
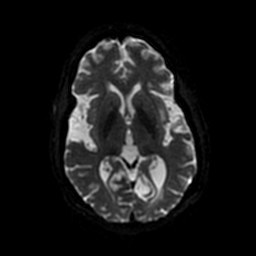
[im 64/96]
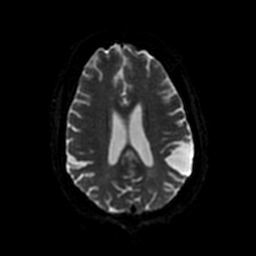
[im 80/96]
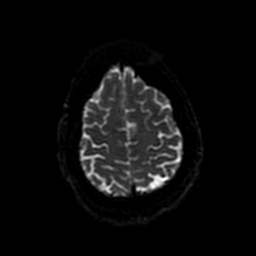
[im 96/96]
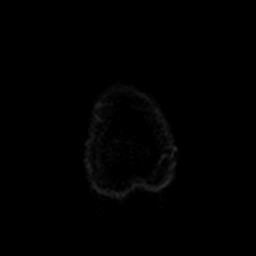

[Series 5: T2 · axial · 5.0mm · 0.47mm/px · z∈[-96,+45]mm · 2 of 26 slices shown (1 of 2)]
[im 1/26]
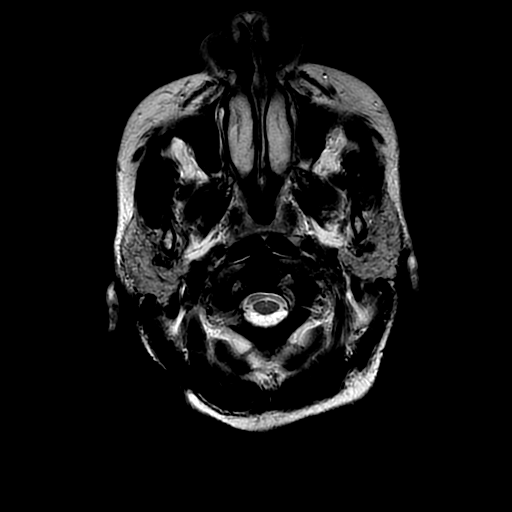
[im 26/26]
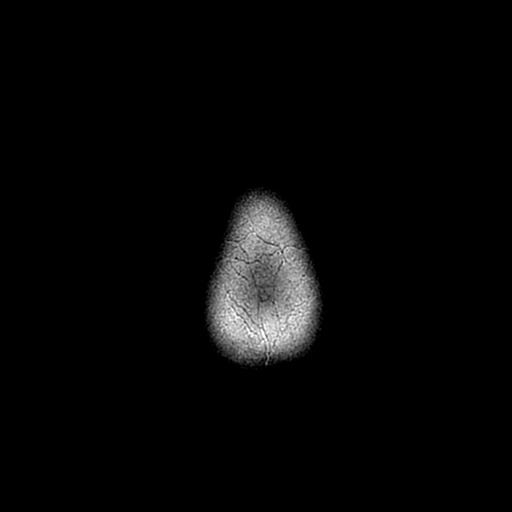

[Series 6: FLAIR · axial · 5.0mm · 0.47mm/px · z∈[-96,+45]mm · 2 of 26 slices shown (2 of 2)]
[im 1/26]
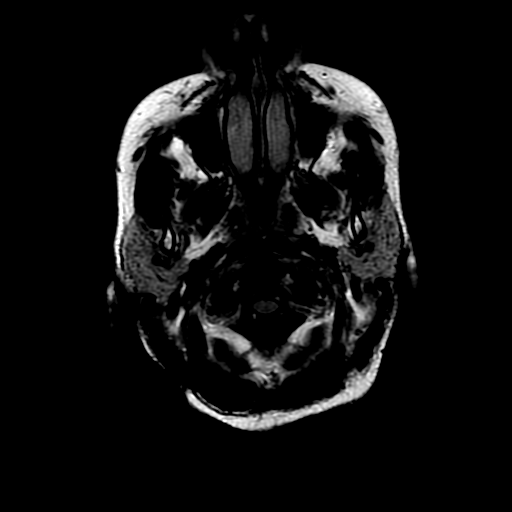
[im 26/26]
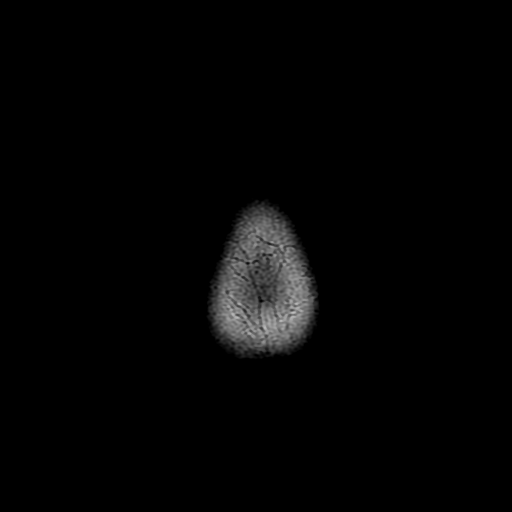

[Series 7: DWI · coronal · 5.0mm · 0.94mm/px · 5 of 68 slices shown (2 of 4)]
[im 1/68]
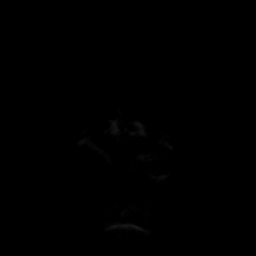
[im 17/68]
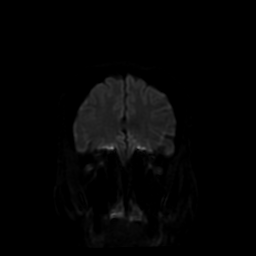
[im 34/68]
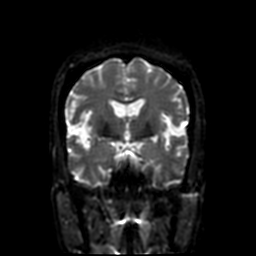
[im 51/68]
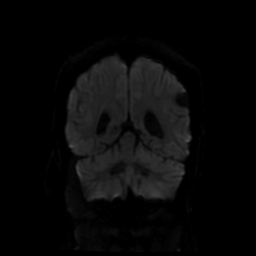
[im 68/68]
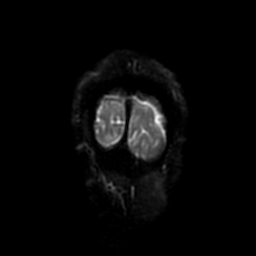

[Series 8: (person_name) · axial · 3.0mm · 0.47mm/px · z∈[-89,+11]mm · 6 of 100 slices shown]
[im 1/100]
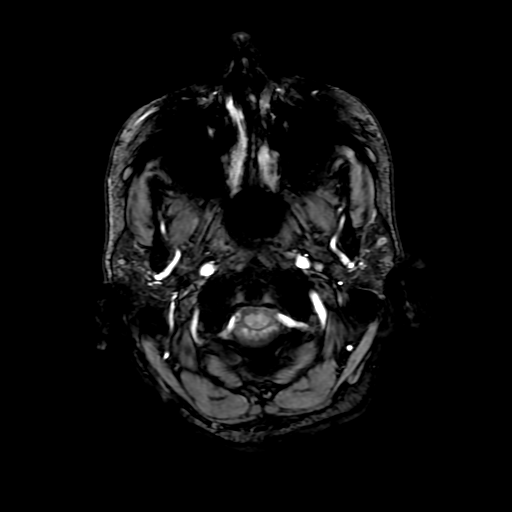
[im 15/100]
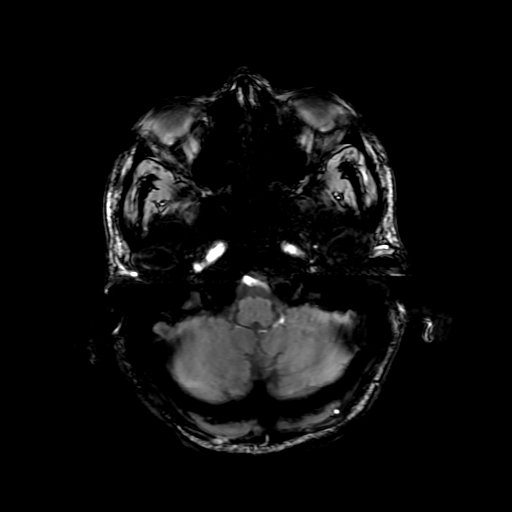
[im 29/100]
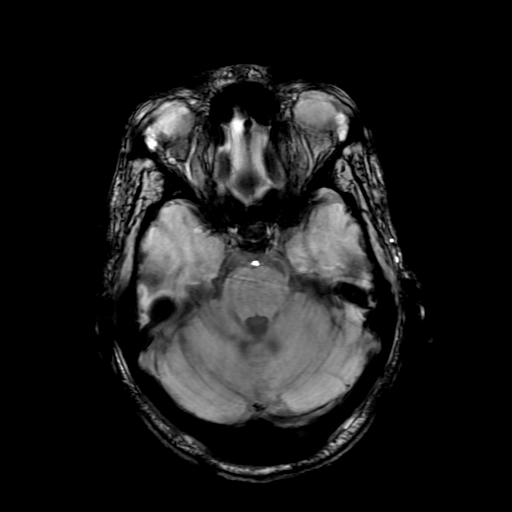
[im 43/100]
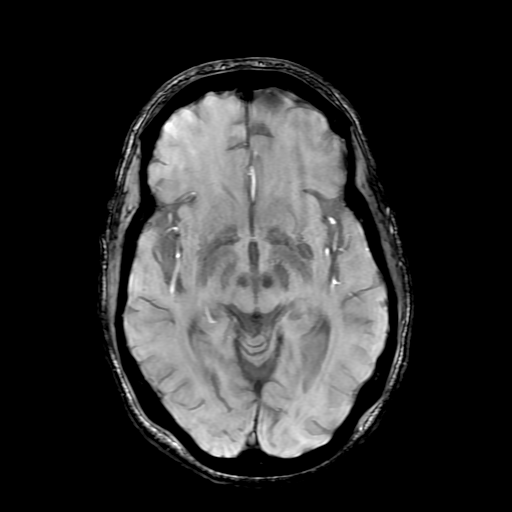
[im 57/100]
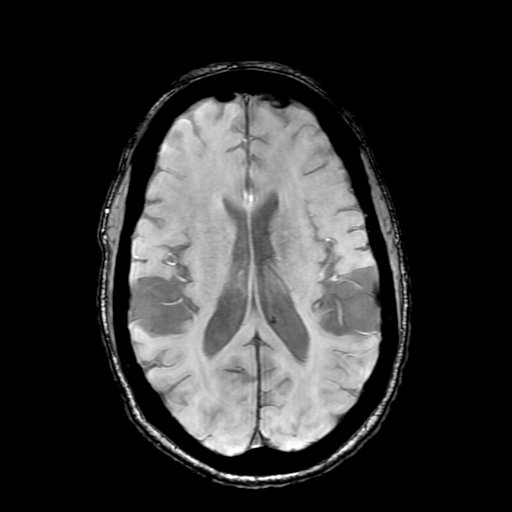
[im 71/100]
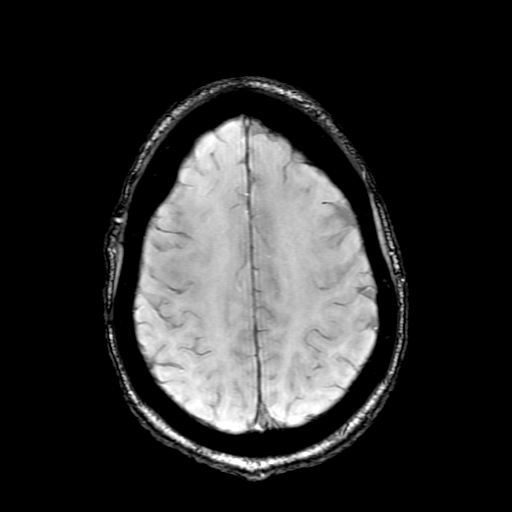

[Series 10: T2 · coronal · 5.0mm · 0.39mm/px · 2 of 30 slices shown (2 of 2)]
[im 1/30]
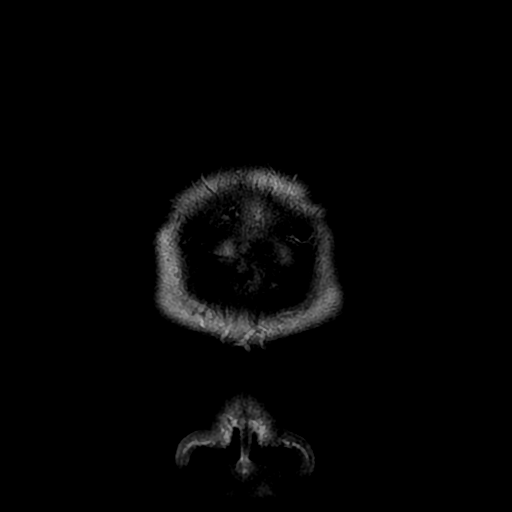
[im 30/30]
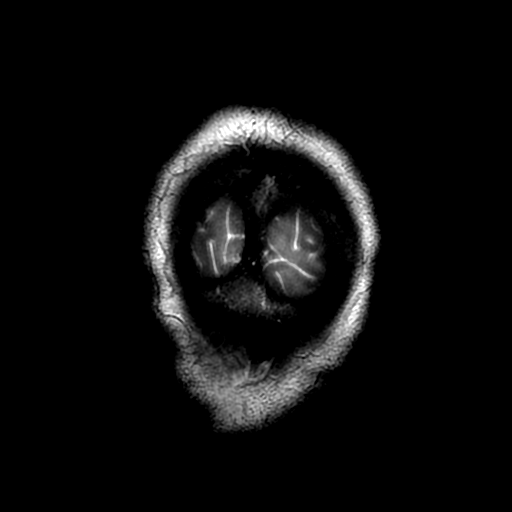

[Series 400: DWI · axial · 3.0mm · 0.94mm/px · z∈[-91,+43]mm · 4 of 48 slices shown (3 of 4)]
[im 1/48]
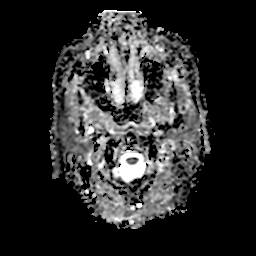
[im 16/48]
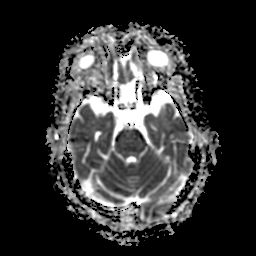
[im 32/48]
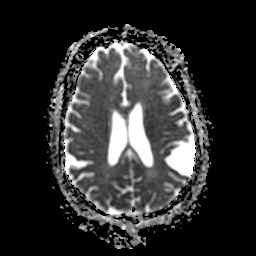
[im 48/48]
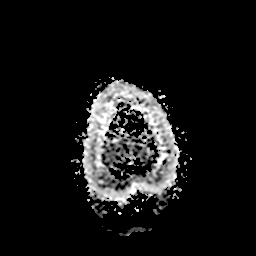

[Series 700: DWI · coronal · 5.0mm · 0.94mm/px · 3 of 34 slices shown (4 of 4)]
[im 1/34]
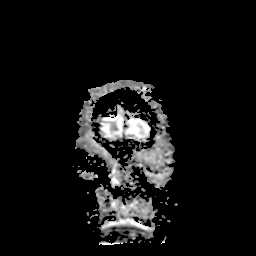
[im 17/34]
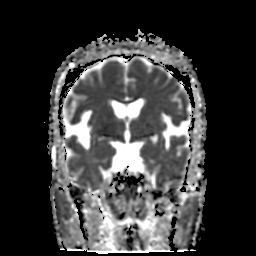
[im 34/34]
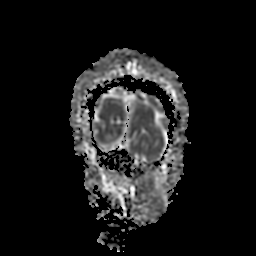

[32 of 48 positions shown; findings below may reference images not displayed]

FINDINGS: No evidence for acute infarction, hemorrhage, mass lesion,
hydrocephalus, or extra-axial fluid.

Fairly symmetric temporoparietal atrophy with marked prominence of
the central sulcus and sylvian fissures. Mild to moderate small
vessel disease is stable. Flow voids are maintained throughout the
carotid, basilar, and vertebral arteries. There are no areas of
chronic hemorrhage. Pituitary, pineal, and cerebellar tonsils
unremarkable. No upper cervical lesions. No asymmetric Hippocampal
insult or volume loss.
IMPRESSION: Severe atrophy is stable.  No acute intracranial findings.

## 2016-03-02 IMAGING — CR DG CHEST 2V
2 series · 2 of 2 positions shown · non-contrast
Comparison: 09/30/2014

CLINICAL DATA: Fatigue off and on for 1 year worsening, headache
for 2 days, visual changes RIGHT side, history kidney disease,
anemia, CHF, diabetes, hypertension, stroke, hepatitis-C

EXAM:
CHEST  2 VIEW

[chest lat]
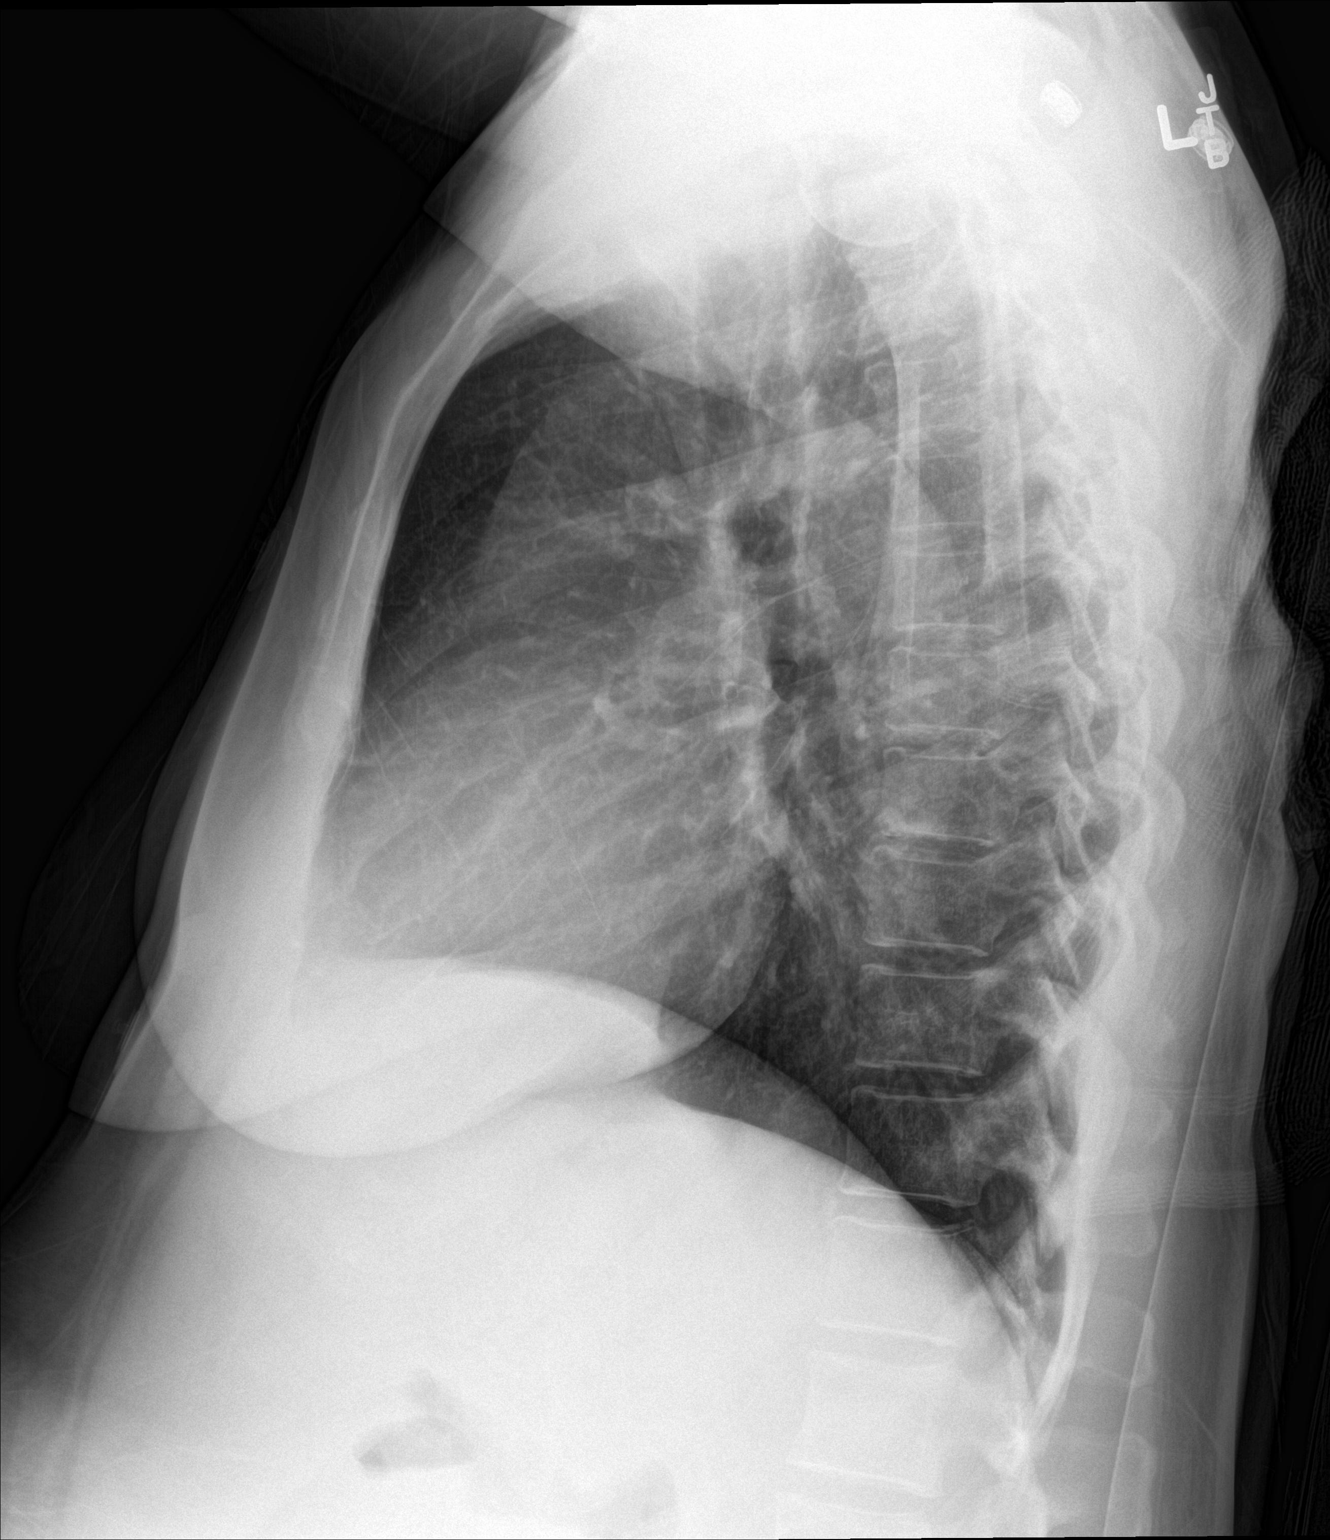

[chest ap]
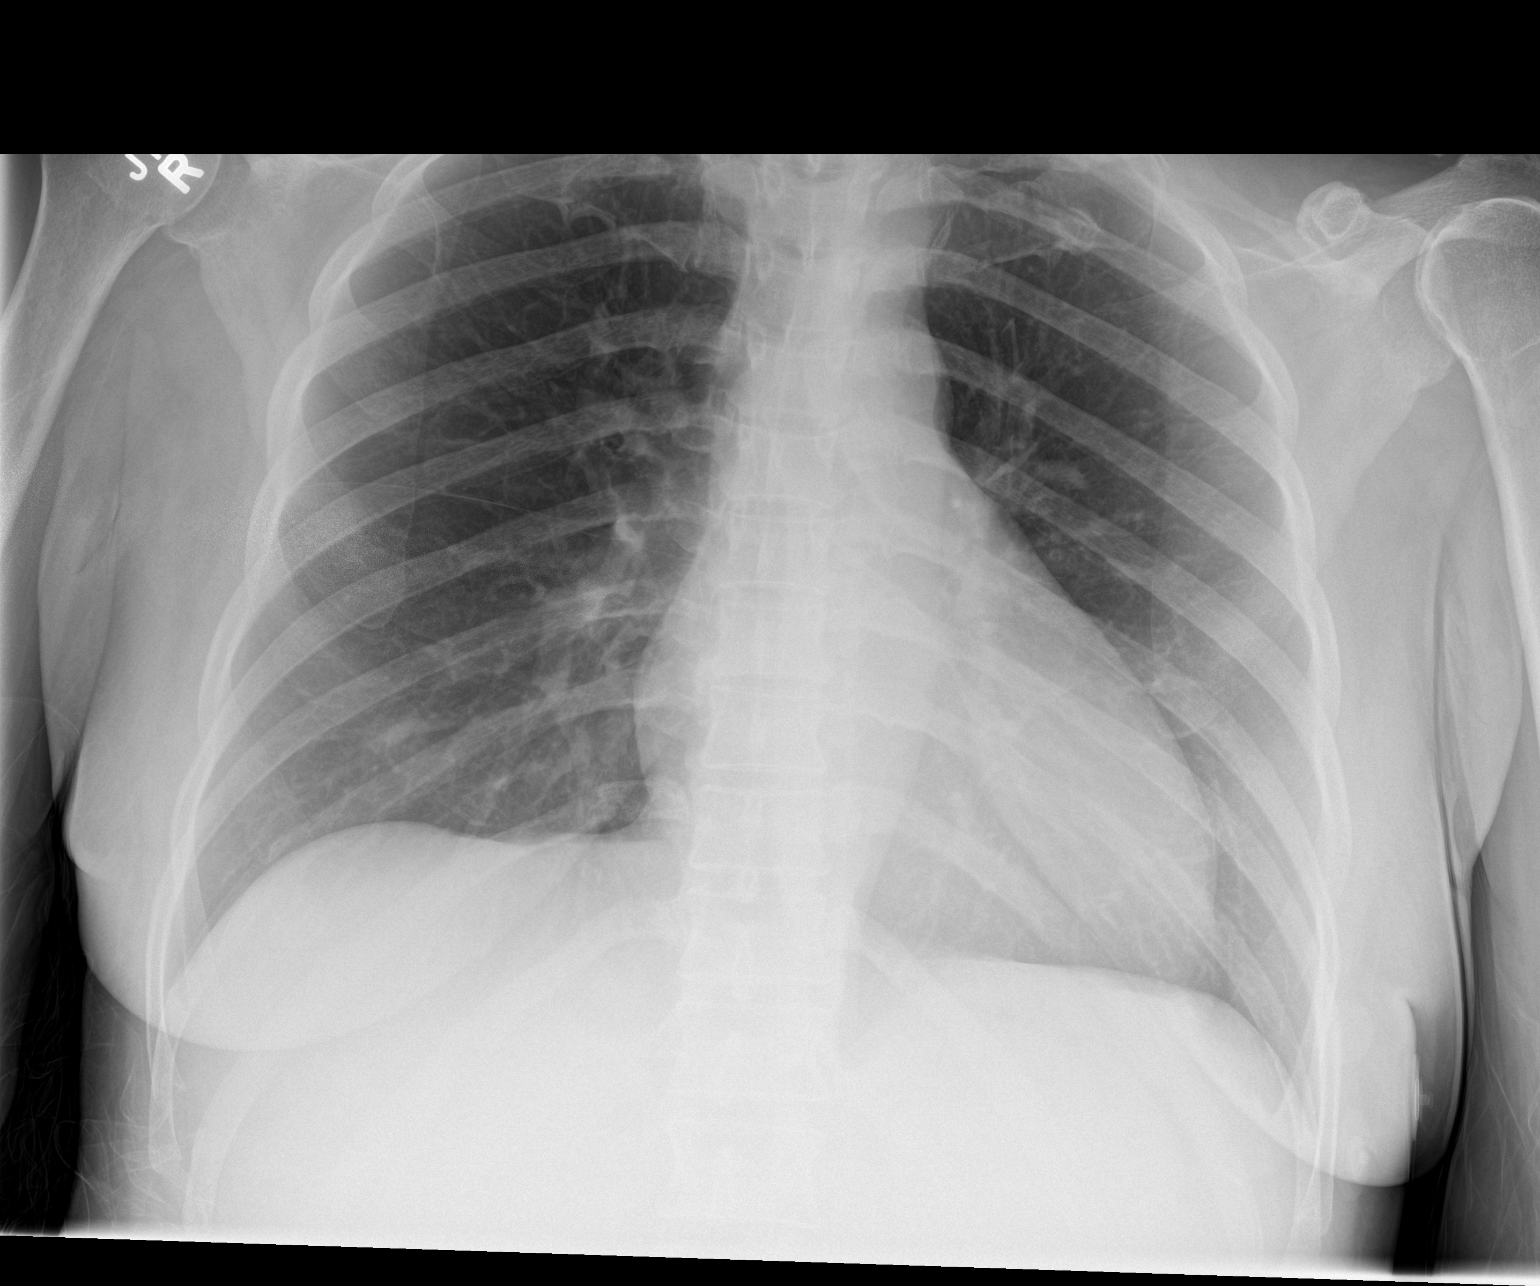

[2 of 2 positions shown; findings below may reference images not displayed]

FINDINGS: Upper normal heart size.

Mediastinal contours and pulmonary vascularity normal.

Lungs clear.

No pleural effusion or pneumothorax.

Bones unremarkable.
IMPRESSION: No acute abnormalities.

## 2016-03-02 IMAGING — MR MR HEAD W/O CM
10 of 12 series · 29 of 48 positions shown · non-contrast
Comparison: CT head earlier today. MR head 11/10/2014. Also prior
MR head 02/02/2014. In

CLINICAL DATA: Acute onset of blurred vision in the RIGHT eye with
gait instability. History of bipolar disorder, diabetes,
hypertension, and glaucoma. Initial encounter.

EXAM:
MRI HEAD WITHOUT CONTRAST
MRA HEAD WITHOUT CONTRAST
TECHNIQUE: Multiplanar, multiecho pulse sequences of the brain and surrounding
structures were obtained without intravenous contrast. Angiographic
images of the head were obtained using MRA technique without
contrast.

[Series 2: FLAIR · sagittal · 5.0mm · 0.47mm/px · 1 of 23 slices shown (1 of 3)]
[im 1/23]
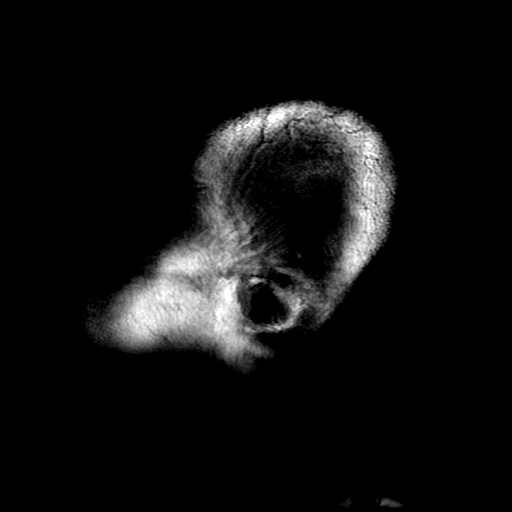

[Series 4: DWI · axial · 3.0mm · 0.94mm/px · z∈[-66,+66]mm · 6 of 94 slices shown (1 of 4)]
[im 1/94]
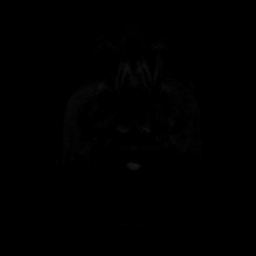
[im 19/94]
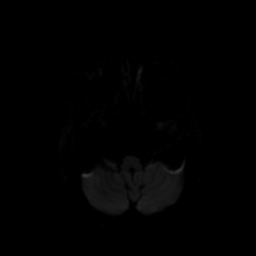
[im 38/94]
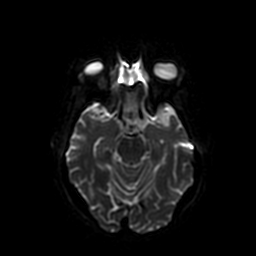
[im 56/94]
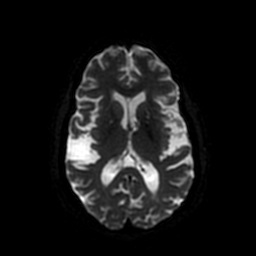
[im 75/94]
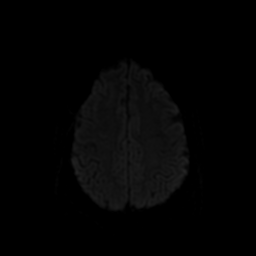
[im 94/94]
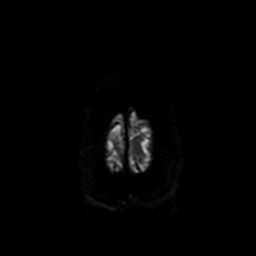

[Series 5: T2 · axial · 5.0mm · 0.47mm/px · z∈[-66,+66]mm · 2 of 24 slices shown (1 of 2)]
[im 1/24]
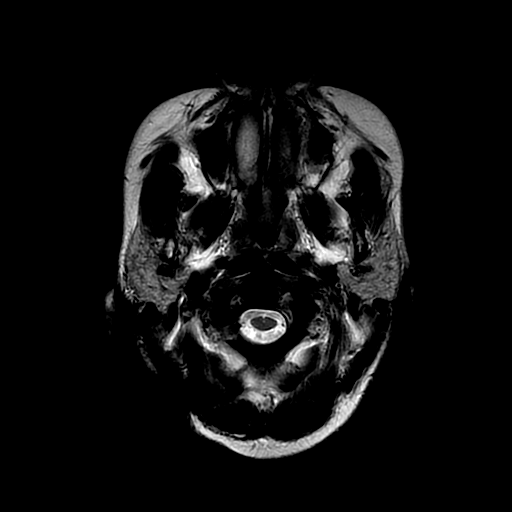
[im 24/24]
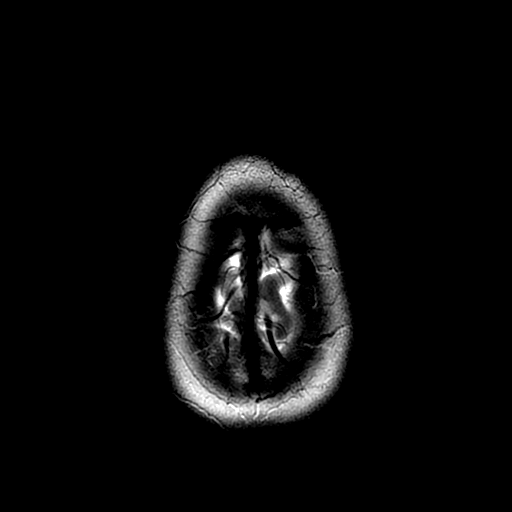

[Series 6: ax (id) 2 · axial · 1.2mm · 0.43mm/px · z∈[-86,-20]mm · 5 of 200 slices shown]
[im 1/200]
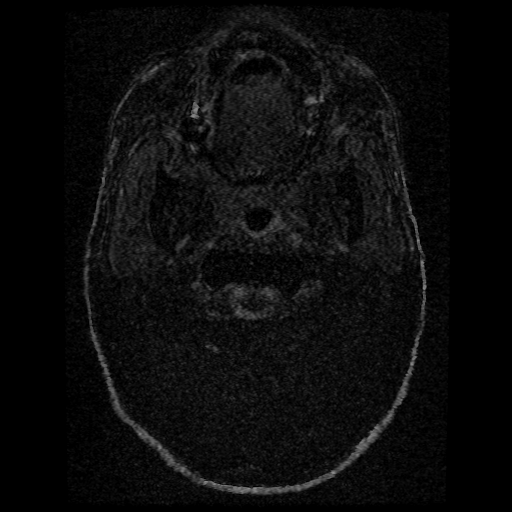
[im 34/200]
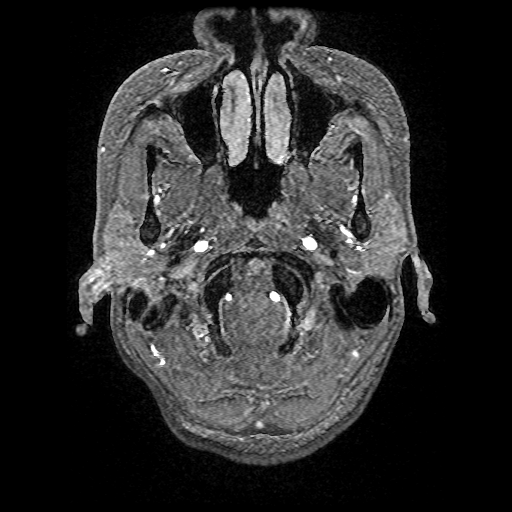
[im 67/200]
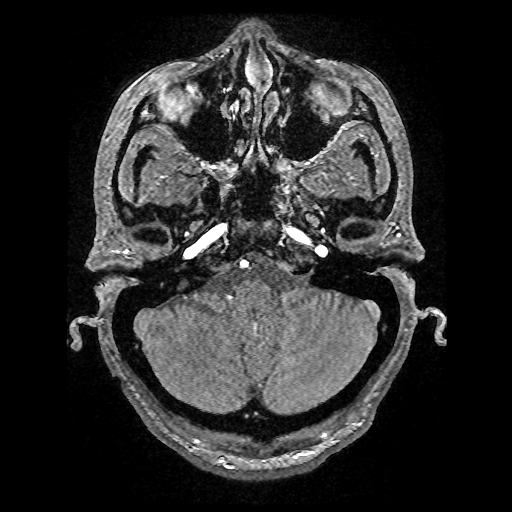
[im 83/200]
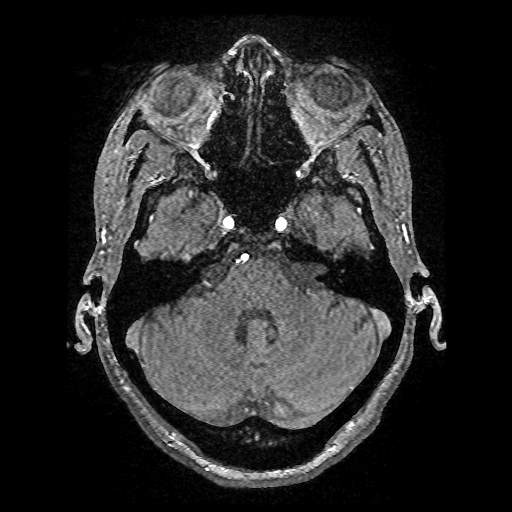
[im 117/200]
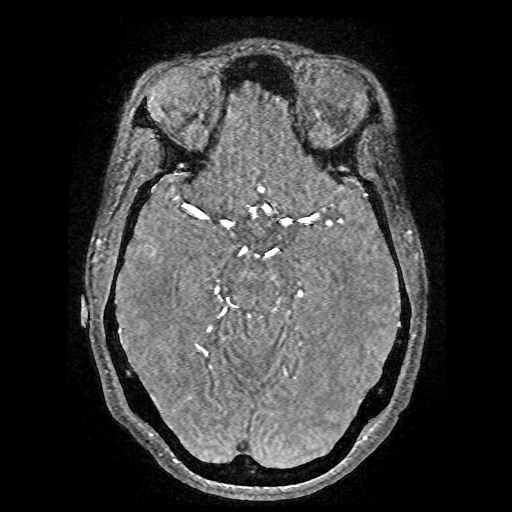

[Series 7: FLAIR · axial · 5.0mm · 0.47mm/px · z∈[-66,+66]mm · 2 of 24 slices shown (2 of 3)]
[im 1/24]
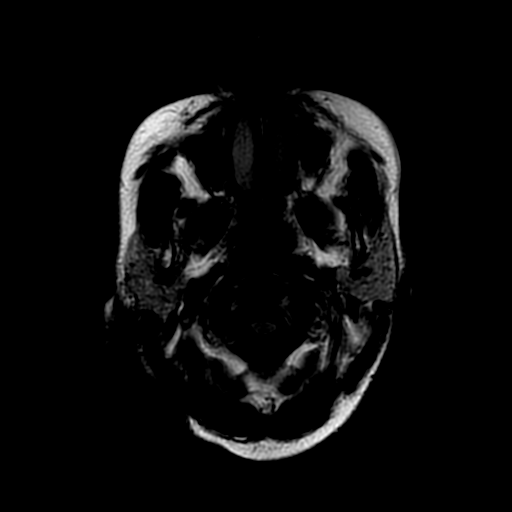
[im 24/24]
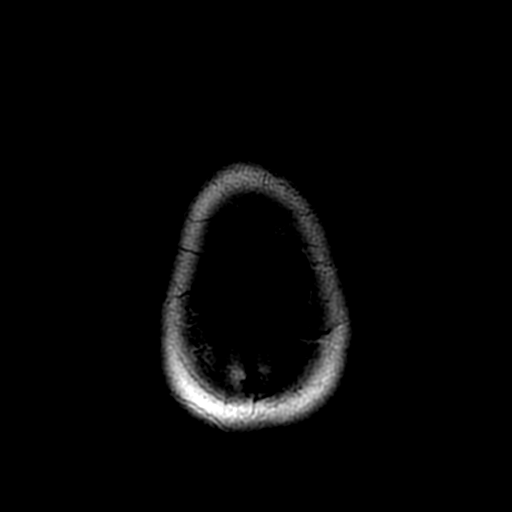

[Series 8: DWI · coronal · 5.0mm · 0.94mm/px · 4 of 66 slices shown (2 of 4)]
[im 1/66]
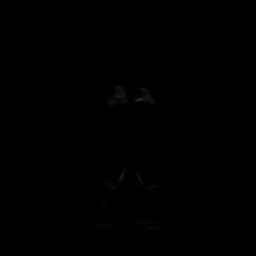
[im 22/66]
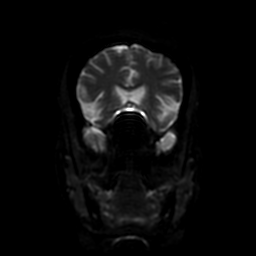
[im 44/66]
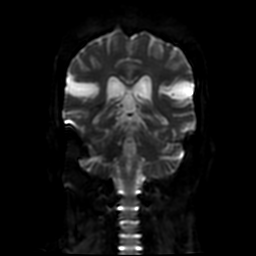
[im 66/66]
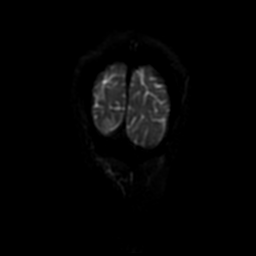

[Series 11: FLAIR · sagittal · 5.0mm · 0.47mm/px · 2 of 23 slices shown (3 of 3)]
[im 1/23]
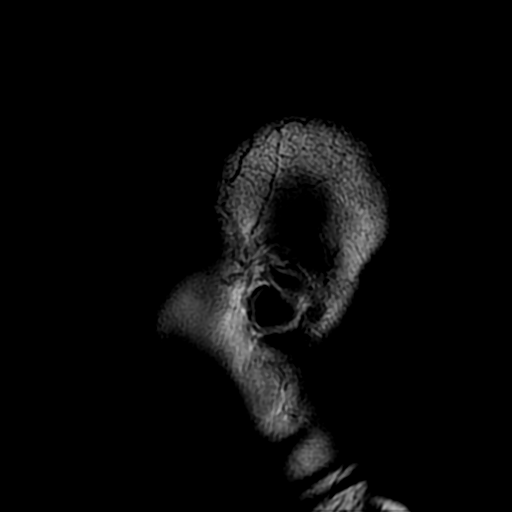
[im 23/23]
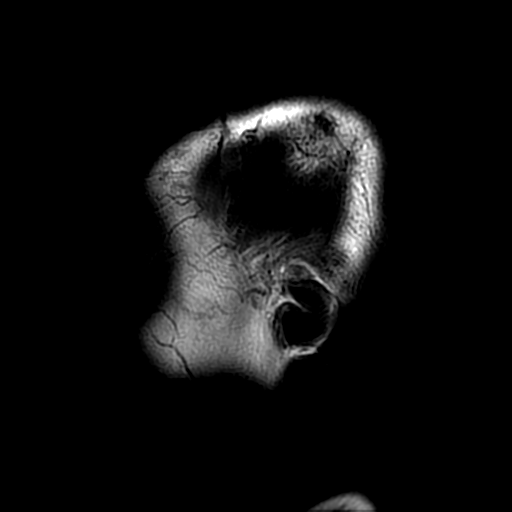

[Series 12: T2 · coronal · 5.0mm · 0.47mm/px · 2 of 28 slices shown (2 of 2)]
[im 1/28]
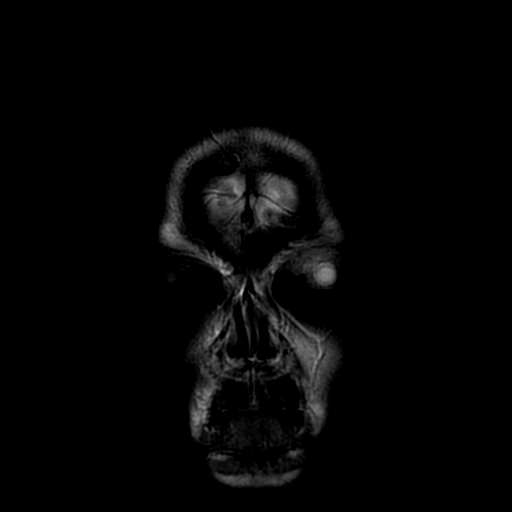
[im 28/28]
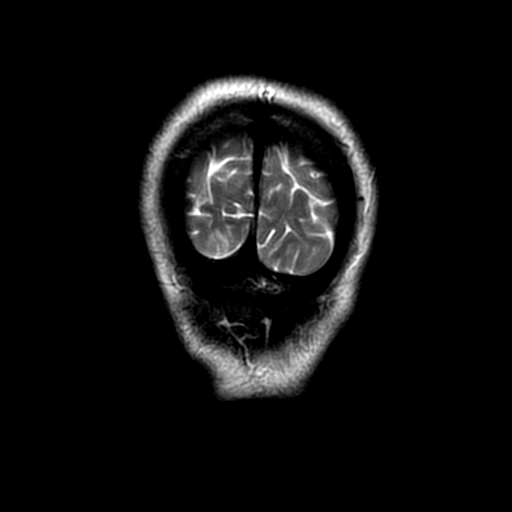

[Series 400: DWI · axial · 3.0mm · 0.94mm/px · z∈[-66,+66]mm · 3 of 47 slices shown (3 of 4)]
[im 1/47]
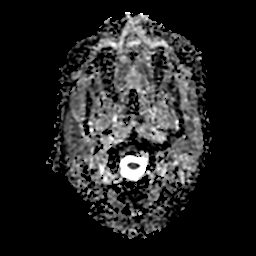
[im 24/47]
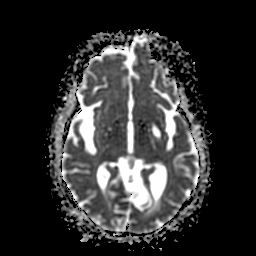
[im 47/47]
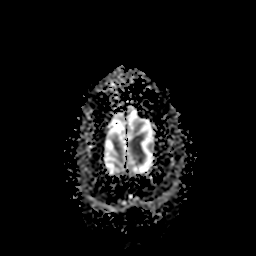

[Series 800: DWI · coronal · 5.0mm · 0.94mm/px · 2 of 33 slices shown (4 of 4)]
[im 1/33]
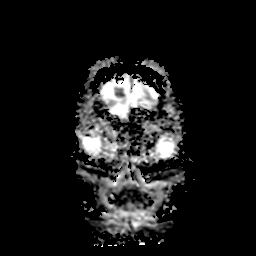
[im 33/33]
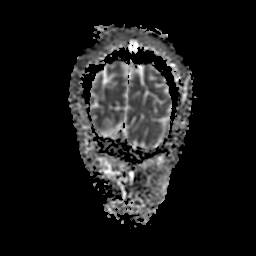

[29 of 48 positions shown; findings below may reference images not displayed]

FINDINGS: MRI HEAD FINDINGS

Unchanged symmetric temporoparietal atrophy with marked prominence
of the central sulcus and sylvian fissures. Mild to moderate small
vessel disease is also stable. No vascular occlusion. No areas of
chronic hemorrhage. No midline abnormality. In extracranial soft
tissues unremarkable. BILATERAL cataract extraction without visible
acute abnormality of the globe.

MRA HEAD FINDINGS

The internal carotid arteries are widely patent. The basilar artery
is widely patent with vertebrals codominant. No intracranial
stenosis or aneurysm.
IMPRESSION: Stable severe atrophy.  No acute intracranial findings.

Negative MRA intracranial.

## 2016-03-02 IMAGING — CT CT HEAD W/O CM
2 series · 15 of 30 positions shown, 19 images · non-contrast
Comparison: MRI 11/10/2014.  CT 11/10/2014

CLINICAL DATA: Code stroke. Leg weakness and heaviness. Right-sided
headache. Abnormal vision right eye.

EXAM:
CT HEAD WITHOUT CONTRAST
TECHNIQUE: Contiguous axial images were obtained from the base of the skull
through the vertex without intravenous contrast.

[Series 201: head w/o, idose (1) · axial · non-contrast · 0.49mm/px · z∈[+129,+259]mm · 13 of 32 slices shown, 17 images]
[im 3/32  brain]
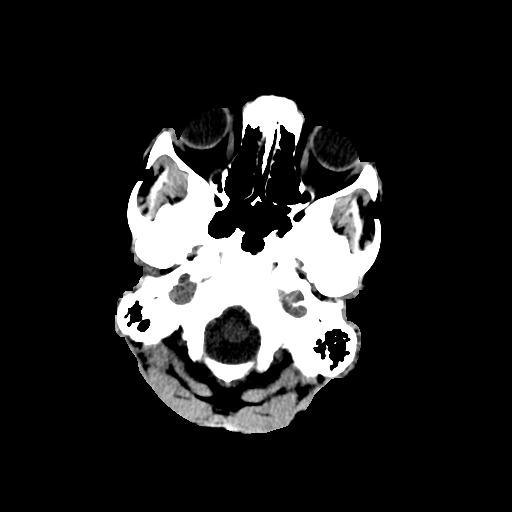
[im 3/32  bone]
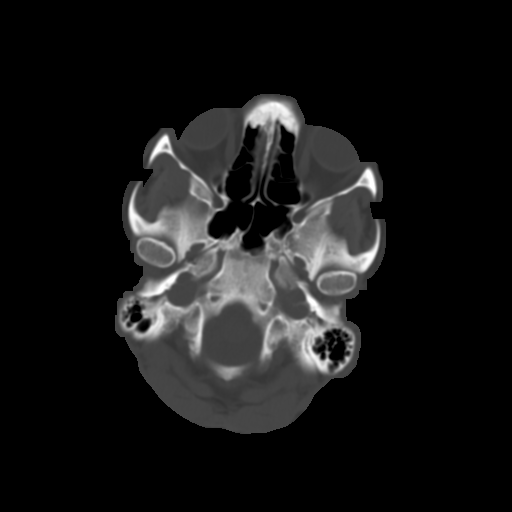
[im 5/32  brain]
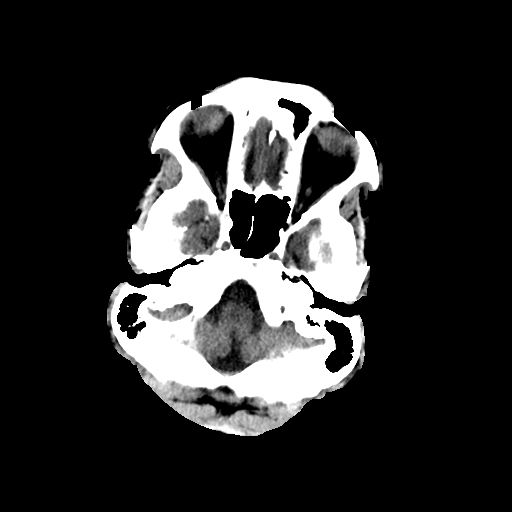
[im 7/32  brain]
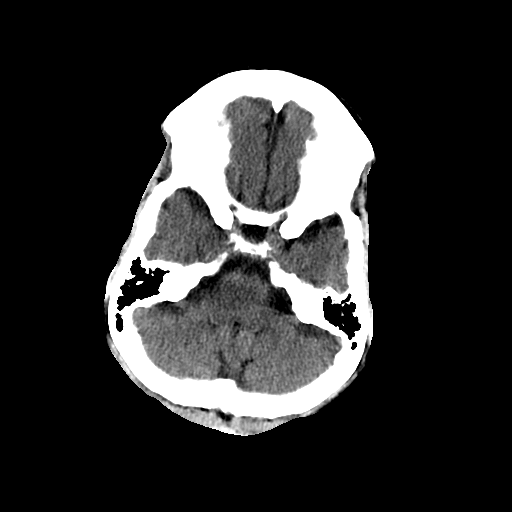
[im 9/32  brain]
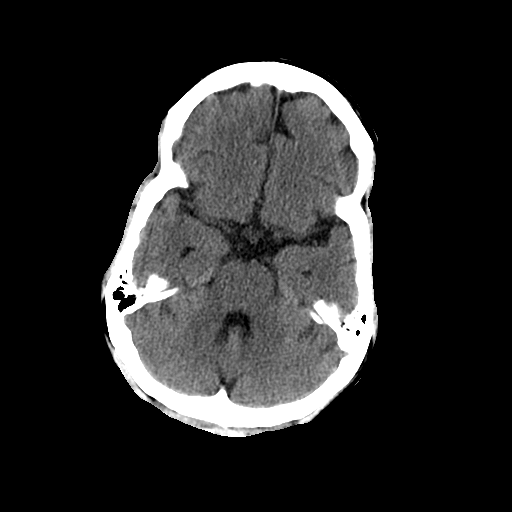
[im 12/32  brain]
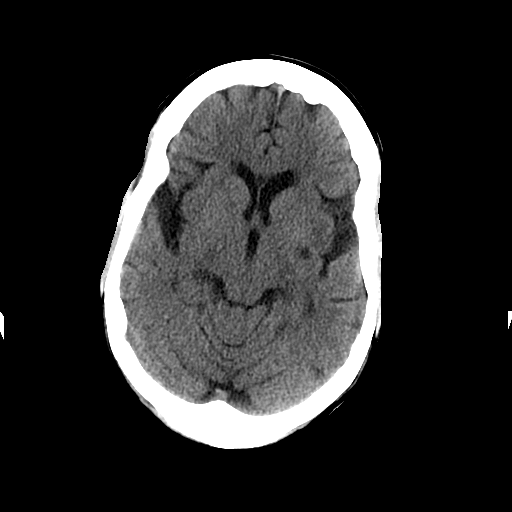
[im 12/32  bone]
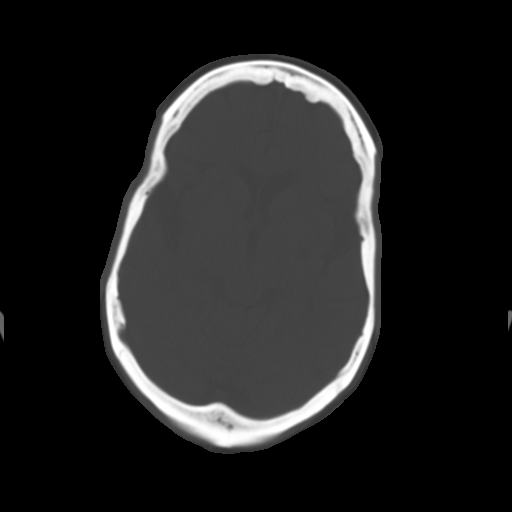
[im 14/32  brain]
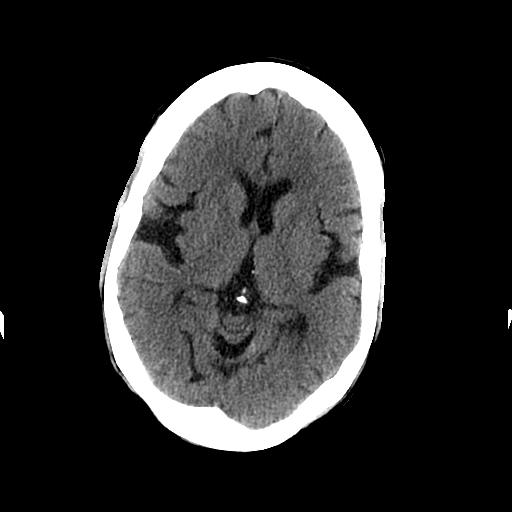
[im 16/32  brain]
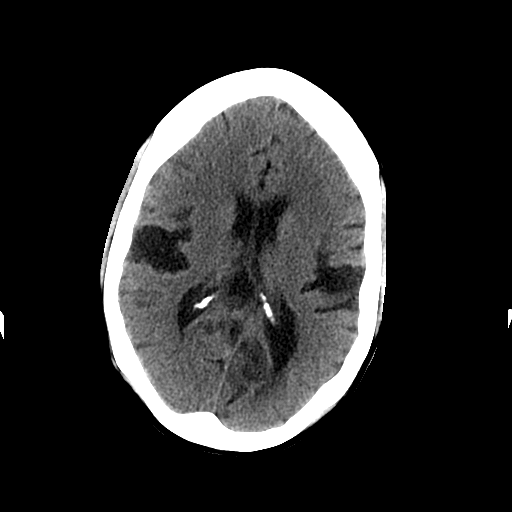
[im 18/32  brain]
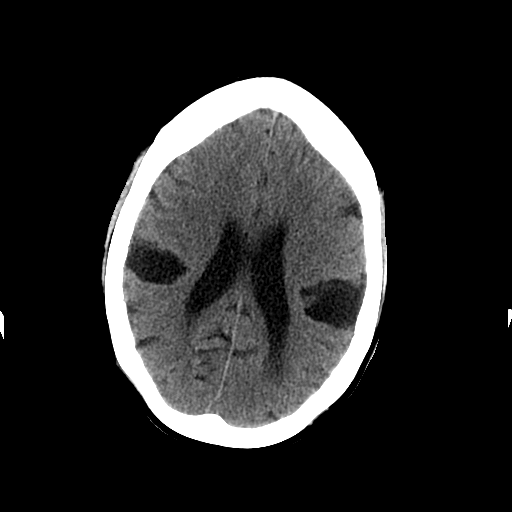
[im 20/32  brain]
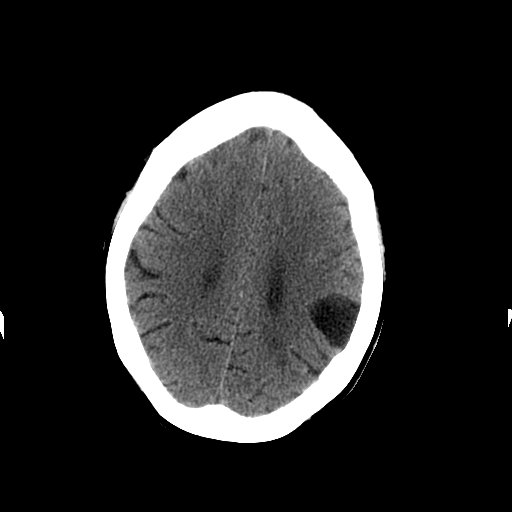
[im 20/32  bone]
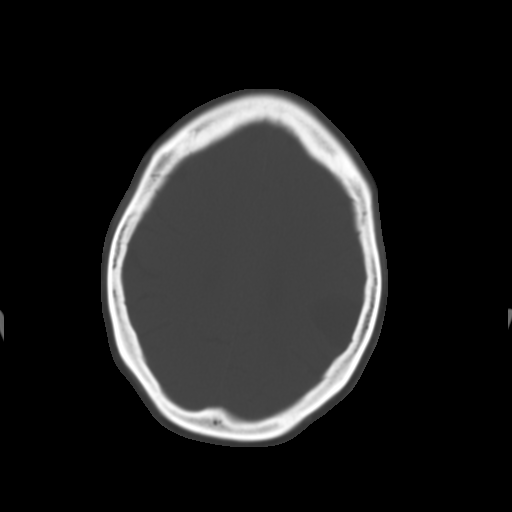
[im 23/32  brain]
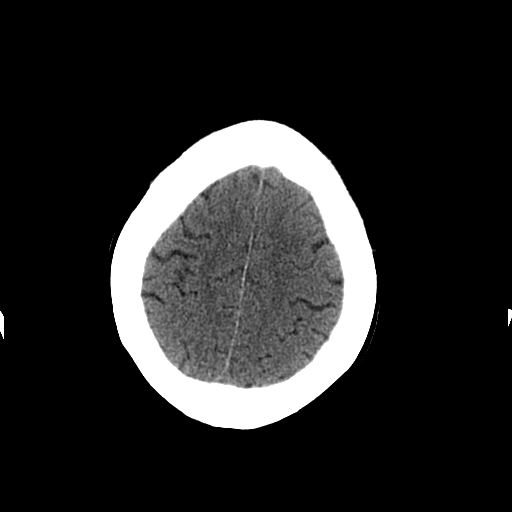
[im 25/32  brain]
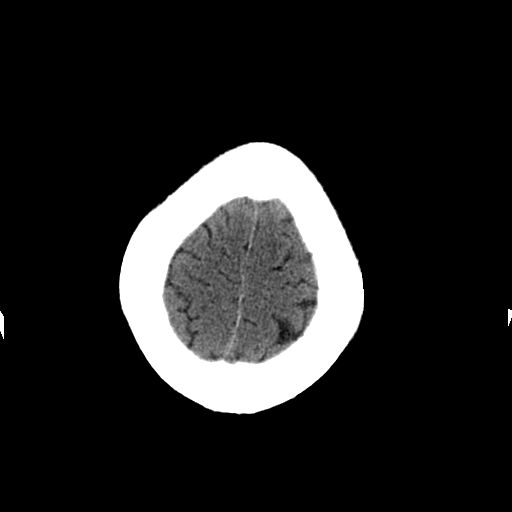
[im 27/32  brain]
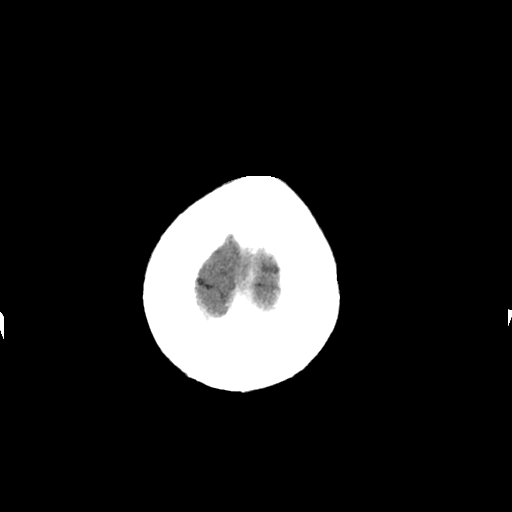
[im 29/32  brain]
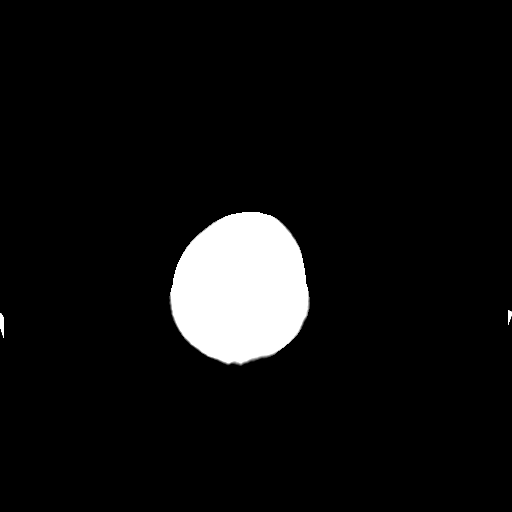
[im 29/32  bone]
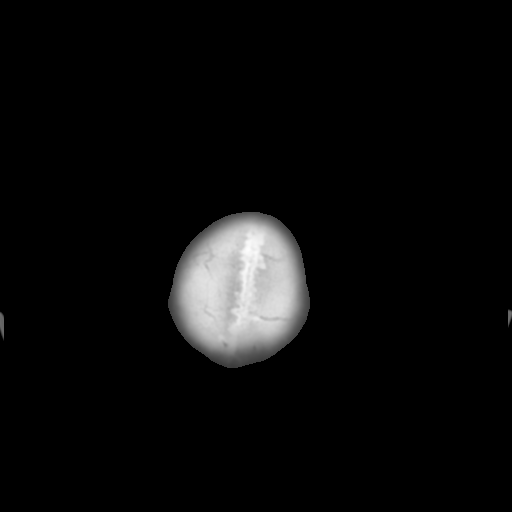

[Series 202: head w/o bone, idose (1) · axial · non-contrast · 0.49mm/px · z∈[+129,+149]mm · 2 of 32 slices shown]
[im 3/32  bone]
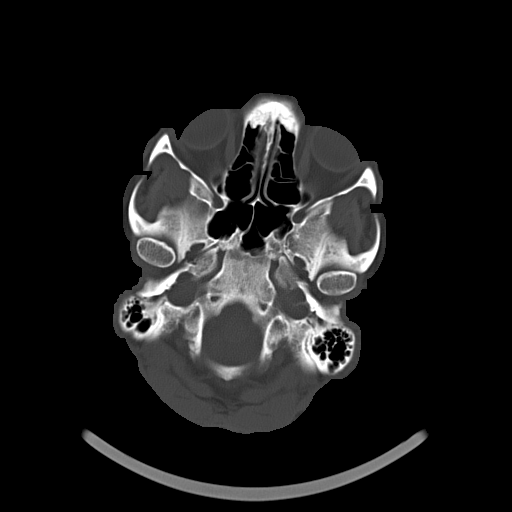
[im 7/32  bone]
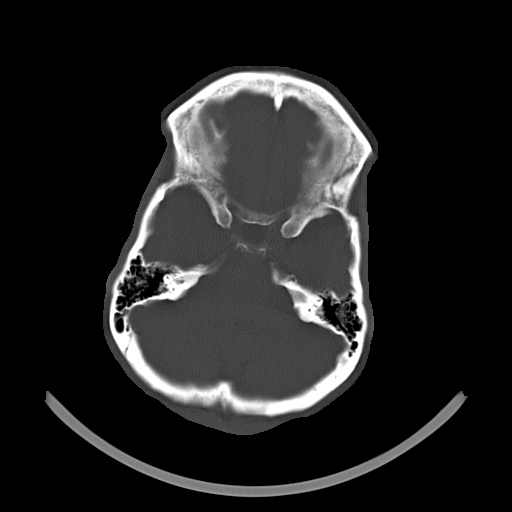

[15 of 30 positions shown; findings below may reference images not displayed]

FINDINGS: Symmetric enlargement of the sylvian fissure bilaterally compatible
with significant atrophy. This is unchanged. Negative for
hydrocephalus. Benign cyst in the left basal ganglia stable. Mild
chronic microvascular ischemic changes in the white matter. Chronic
encephalomalacia left occipital lobe unchanged.

Negative for acute infarct. Negative for hemorrhage or mass. No
change from prior studies.
IMPRESSION: Atrophy and chronic microvascular ischemic changes. No acute
abnormality and no change from the prior exams.

Critical Value/emergent results were called by telephone at the time
of interpretation on 11/22/2014 at [DATE] to Dr. CONCHY DAHLQUIST , who
verbally acknowledged these results.

## 2016-03-03 ENCOUNTER — Encounter (HOSPITAL_COMMUNITY): Payer: Self-pay | Admitting: Emergency Medicine

## 2016-03-03 ENCOUNTER — Inpatient Hospital Stay (HOSPITAL_COMMUNITY)
Admission: EM | Admit: 2016-03-03 | Discharge: 2016-03-05 | DRG: 640 | Disposition: A | Discharge status: Home / Self Care | Payer: Medicare Other | Attending: Internal Medicine | Admitting: Internal Medicine

## 2016-03-03 ENCOUNTER — Emergency Department (HOSPITAL_COMMUNITY): Payer: Medicare Other

## 2016-03-03 DIAGNOSIS — J810 Acute pulmonary edema: Secondary | ICD-10-CM | POA: Diagnosis present

## 2016-03-03 DIAGNOSIS — Z79891 Long term (current) use of opiate analgesic: Secondary | ICD-10-CM | POA: Diagnosis not present

## 2016-03-03 DIAGNOSIS — G40909 Epilepsy, unspecified, not intractable, without status epilepticus: Secondary | ICD-10-CM | POA: Diagnosis present

## 2016-03-03 DIAGNOSIS — Z6830 Body mass index (BMI) 30.0-30.9, adult: Secondary | ICD-10-CM

## 2016-03-03 DIAGNOSIS — E8770 Fluid overload, unspecified: Secondary | ICD-10-CM | POA: Diagnosis present

## 2016-03-03 DIAGNOSIS — G894 Chronic pain syndrome: Secondary | ICD-10-CM | POA: Diagnosis present

## 2016-03-03 DIAGNOSIS — E1140 Type 2 diabetes mellitus with diabetic neuropathy, unspecified: Secondary | ICD-10-CM | POA: Diagnosis present

## 2016-03-03 DIAGNOSIS — Z992 Dependence on renal dialysis: Secondary | ICD-10-CM

## 2016-03-03 DIAGNOSIS — Z9119 Patient's noncompliance with other medical treatment and regimen: Secondary | ICD-10-CM

## 2016-03-03 DIAGNOSIS — Z89432 Acquired absence of left foot: Secondary | ICD-10-CM | POA: Diagnosis not present

## 2016-03-03 DIAGNOSIS — Z8673 Personal history of transient ischemic attack (TIA), and cerebral infarction without residual deficits: Secondary | ICD-10-CM | POA: Diagnosis not present

## 2016-03-03 DIAGNOSIS — F319 Bipolar disorder, unspecified: Secondary | ICD-10-CM | POA: Diagnosis present

## 2016-03-03 DIAGNOSIS — F259 Schizoaffective disorder, unspecified: Secondary | ICD-10-CM | POA: Diagnosis present

## 2016-03-03 DIAGNOSIS — K5909 Other constipation: Secondary | ICD-10-CM | POA: Diagnosis present

## 2016-03-03 DIAGNOSIS — Z8249 Family history of ischemic heart disease and other diseases of the circulatory system: Secondary | ICD-10-CM

## 2016-03-03 DIAGNOSIS — R0902 Hypoxemia: Secondary | ICD-10-CM

## 2016-03-03 DIAGNOSIS — S42201P Unspecified fracture of upper end of right humerus, subsequent encounter for fracture with malunion: Secondary | ICD-10-CM | POA: Diagnosis not present

## 2016-03-03 DIAGNOSIS — I132 Hypertensive heart and chronic kidney disease with heart failure and with stage 5 chronic kidney disease, or end stage renal disease: Secondary | ICD-10-CM | POA: Diagnosis present

## 2016-03-03 DIAGNOSIS — Z87891 Personal history of nicotine dependence: Secondary | ICD-10-CM

## 2016-03-03 DIAGNOSIS — D649 Anemia, unspecified: Secondary | ICD-10-CM | POA: Diagnosis present

## 2016-03-03 DIAGNOSIS — H409 Unspecified glaucoma: Secondary | ICD-10-CM | POA: Diagnosis present

## 2016-03-03 DIAGNOSIS — E785 Hyperlipidemia, unspecified: Secondary | ICD-10-CM | POA: Diagnosis present

## 2016-03-03 DIAGNOSIS — E1165 Type 2 diabetes mellitus with hyperglycemia: Secondary | ICD-10-CM | POA: Diagnosis present

## 2016-03-03 DIAGNOSIS — Z79899 Other long term (current) drug therapy: Secondary | ICD-10-CM | POA: Diagnosis not present

## 2016-03-03 DIAGNOSIS — Z794 Long term (current) use of insulin: Secondary | ICD-10-CM

## 2016-03-03 DIAGNOSIS — I5042 Chronic combined systolic (congestive) and diastolic (congestive) heart failure: Secondary | ICD-10-CM | POA: Diagnosis present

## 2016-03-03 DIAGNOSIS — F419 Anxiety disorder, unspecified: Secondary | ICD-10-CM | POA: Diagnosis present

## 2016-03-03 DIAGNOSIS — N186 End stage renal disease: Secondary | ICD-10-CM | POA: Diagnosis present

## 2016-03-03 DIAGNOSIS — J9601 Acute respiratory failure with hypoxia: Secondary | ICD-10-CM | POA: Diagnosis present

## 2016-03-03 DIAGNOSIS — N2581 Secondary hyperparathyroidism of renal origin: Secondary | ICD-10-CM | POA: Diagnosis present

## 2016-03-03 DIAGNOSIS — K219 Gastro-esophageal reflux disease without esophagitis: Secondary | ICD-10-CM | POA: Diagnosis present

## 2016-03-03 DIAGNOSIS — B1921 Unspecified viral hepatitis C with hepatic coma: Secondary | ICD-10-CM | POA: Diagnosis present

## 2016-03-03 DIAGNOSIS — F3174 Bipolar disorder, in full remission, most recent episode manic: Secondary | ICD-10-CM | POA: Diagnosis not present

## 2016-03-03 DIAGNOSIS — F1410 Cocaine abuse, uncomplicated: Secondary | ICD-10-CM | POA: Diagnosis present

## 2016-03-03 DIAGNOSIS — S42401A Unspecified fracture of lower end of right humerus, initial encounter for closed fracture: Secondary | ICD-10-CM | POA: Diagnosis present

## 2016-03-03 DIAGNOSIS — Z531 Procedure and treatment not carried out because of patient's decision for reasons of belief and group pressure: Secondary | ICD-10-CM | POA: Diagnosis present

## 2016-03-03 LAB — CBC WITH DIFFERENTIAL/PLATELET
Basophils Absolute: 0 10*3/uL (ref 0.0–0.1)
Basophils Relative: 0 %
EOS ABS: 0.1 10*3/uL (ref 0.0–0.7)
Eosinophils Relative: 3 %
HEMATOCRIT: 38.2 % (ref 36.0–46.0)
HEMOGLOBIN: 11.7 g/dL — AB (ref 12.0–15.0)
LYMPHS ABS: 1.1 10*3/uL (ref 0.7–4.0)
Lymphocytes Relative: 41 %
MCH: 30.1 pg (ref 26.0–34.0)
MCHC: 30.6 g/dL (ref 30.0–36.0)
MCV: 98.2 fL (ref 78.0–100.0)
Monocytes Absolute: 0.1 10*3/uL (ref 0.1–1.0)
Monocytes Relative: 4 %
NEUTROS ABS: 1.4 10*3/uL — AB (ref 1.7–7.7)
NEUTROS PCT: 52 %
Platelets: 132 10*3/uL — ABNORMAL LOW (ref 150–400)
RBC: 3.89 MIL/uL (ref 3.87–5.11)
RDW: 16.3 % — ABNORMAL HIGH (ref 11.5–15.5)
WBC: 2.8 10*3/uL — ABNORMAL LOW (ref 4.0–10.5)

## 2016-03-03 LAB — GLUCOSE, CAPILLARY
GLUCOSE-CAPILLARY: 358 mg/dL — AB (ref 65–99)
GLUCOSE-CAPILLARY: 326 mg/dL — AB (ref 65–99)

## 2016-03-03 LAB — I-STAT CHEM 8, ED
BUN: 37 mg/dL — AB (ref 6–20)
CREATININE: 8.1 mg/dL — AB (ref 0.44–1.00)
Calcium, Ion: 0.99 mmol/L — ABNORMAL LOW (ref 1.13–1.30)
Chloride: 93 mmol/L — ABNORMAL LOW (ref 101–111)
Glucose, Bld: 338 mg/dL — ABNORMAL HIGH (ref 65–99)
HEMATOCRIT: 40 % (ref 36.0–46.0)
Hemoglobin: 13.6 g/dL (ref 12.0–15.0)
POTASSIUM: 4 mmol/L (ref 3.5–5.1)
Sodium: 134 mmol/L — ABNORMAL LOW (ref 135–145)
TCO2: 29 mmol/L (ref 0–100)

## 2016-03-03 LAB — BASIC METABOLIC PANEL
Anion gap: 16 — ABNORMAL HIGH (ref 5–15)
BUN: 35 mg/dL — ABNORMAL HIGH (ref 6–20)
CHLORIDE: 91 mmol/L — AB (ref 101–111)
CO2: 28 mmol/L (ref 22–32)
CREATININE: 8.34 mg/dL — AB (ref 0.44–1.00)
Calcium: 8.9 mg/dL (ref 8.9–10.3)
GFR calc non Af Amer: 5 mL/min — ABNORMAL LOW (ref >60)
GFR, EST AFRICAN AMERICAN: 6 mL/min — AB (ref >60)
Glucose, Bld: 336 mg/dL — ABNORMAL HIGH (ref 65–99)
Potassium: 4 mmol/L (ref 3.5–5.1)
Sodium: 135 mmol/L (ref 135–145)

## 2016-03-03 LAB — MRSA PCR SCREENING: MRSA by PCR: INVALID — AB

## 2016-03-03 LAB — I-STAT TROPONIN, ED: Troponin i, poc: 0 ng/mL (ref 0.00–0.08)

## 2016-03-03 LAB — BRAIN NATRIURETIC PEPTIDE: B Natriuretic Peptide: 1298.5 pg/mL — ABNORMAL HIGH (ref 0.0–100.0)

## 2016-03-03 MED ORDER — LORAZEPAM 0.5 MG PO TABS: 0.5000 mg | ORAL_TABLET | ORAL | Status: DC

## 2016-03-03 MED ORDER — ACETAMINOPHEN 650 MG RE SUPP: 650.0000 mg | Freq: Four times a day (QID) | RECTAL | Status: DC | PRN

## 2016-03-03 MED ORDER — OXYCODONE HCL 5 MG PO TABS
5.0000 mg | ORAL_TABLET | Freq: Four times a day (QID) | ORAL | Status: DC | PRN
  Administered 2016-03-03 – 2016-03-04 (×4): 5 mg via ORAL
  Filled 2016-03-03 (×4): qty 1

## 2016-03-03 MED ORDER — AMLODIPINE BESYLATE 10 MG PO TABS
10.0000 mg | ORAL_TABLET | Freq: Every day | ORAL | Status: DC
  Administered 2016-03-03 – 2016-03-04 (×2): 10 mg via ORAL
  Filled 2016-03-03 (×2): qty 1

## 2016-03-03 MED ORDER — INSULIN GLARGINE 100 UNIT/ML ~~LOC~~ SOLN
25.0000 [IU] | Freq: Every day | SUBCUTANEOUS | Status: DC
  Administered 2016-03-03 – 2016-03-04 (×2): 25 [IU] via SUBCUTANEOUS
  Filled 2016-03-03 (×3): qty 0.25

## 2016-03-03 MED ORDER — CLONIDINE HCL 0.1 MG PO TABS
0.1000 mg | ORAL_TABLET | Freq: Three times a day (TID) | ORAL | Status: DC
  Administered 2016-03-03 – 2016-03-04 (×3): 0.1 mg via ORAL
  Filled 2016-03-03 (×3): qty 1

## 2016-03-03 MED ORDER — ACETAMINOPHEN 325 MG PO TABS
650.0000 mg | ORAL_TABLET | Freq: Four times a day (QID) | ORAL | Status: DC | PRN
Start: 2016-03-03 — End: 2016-03-05
  Filled 2016-03-03 (×2): qty 2

## 2016-03-03 MED ORDER — ATORVASTATIN CALCIUM 40 MG PO TABS
40.0000 mg | ORAL_TABLET | Freq: Every day | ORAL | Status: DC
  Administered 2016-03-03 – 2016-03-04 (×2): 40 mg via ORAL
  Filled 2016-03-03 (×2): qty 1

## 2016-03-03 MED ORDER — ONDANSETRON HCL 4 MG PO TABS: 4.0000 mg | ORAL_TABLET | Freq: Four times a day (QID) | ORAL | Status: DC | PRN

## 2016-03-03 MED ORDER — CARVEDILOL 3.125 MG PO TABS
3.1250 mg | ORAL_TABLET | Freq: Two times a day (BID) | ORAL | Status: DC
  Administered 2016-03-03 – 2016-03-04 (×3): 3.125 mg via ORAL
  Filled 2016-03-03 (×3): qty 1

## 2016-03-03 MED ORDER — NITROGLYCERIN 0.4 MG SL SUBL
0.4000 mg | SUBLINGUAL_TABLET | SUBLINGUAL | Status: DC | PRN
  Administered 2016-03-03 (×2): 0.4 mg via SUBLINGUAL
  Filled 2016-03-03: qty 1

## 2016-03-03 MED ORDER — INSULIN ASPART 100 UNIT/ML ~~LOC~~ SOLN
2.0000 [IU] | Freq: Three times a day (TID) | SUBCUTANEOUS | Status: DC
  Administered 2016-03-03 – 2016-03-05 (×5): 2 [IU] via SUBCUTANEOUS

## 2016-03-03 MED ORDER — POLYETHYLENE GLYCOL 3350 17 G PO PACK: 17.0000 g | PACK | Freq: Every day | ORAL | Status: DC | PRN

## 2016-03-03 MED ORDER — LIDOCAINE-PRILOCAINE 2.5-2.5 % EX CREA: 1.0000 "application " | TOPICAL_CREAM | CUTANEOUS | Status: DC | PRN

## 2016-03-03 MED ORDER — INSULIN ASPART 100 UNIT/ML ~~LOC~~ SOLN
0.0000 [IU] | Freq: Three times a day (TID) | SUBCUTANEOUS | Status: DC
  Administered 2016-03-03: 9 [IU] via SUBCUTANEOUS
  Administered 2016-03-04: 1 [IU] via SUBCUTANEOUS
  Administered 2016-03-04 (×2): 2 [IU] via SUBCUTANEOUS
  Administered 2016-03-05: 1 [IU] via SUBCUTANEOUS
  Administered 2016-03-05: 3 [IU] via SUBCUTANEOUS

## 2016-03-03 MED ORDER — DIPHENHYDRAMINE HCL 25 MG PO CAPS
25.0000 mg | ORAL_CAPSULE | Freq: Four times a day (QID) | ORAL | Status: DC | PRN
Start: 2016-03-03 — End: 2016-03-05
  Administered 2016-03-05: 25 mg via ORAL
  Filled 2016-03-03: qty 1

## 2016-03-03 MED ORDER — POLYVINYL ALCOHOL 1.4 % OP SOLN: 2.0000 [drp] | Freq: Four times a day (QID) | OPHTHALMIC | Status: DC | PRN

## 2016-03-03 MED ORDER — LORAZEPAM 0.5 MG PO TABS
0.5000 mg | ORAL_TABLET | Freq: Two times a day (BID) | ORAL | Status: DC | PRN
  Administered 2016-03-04: 0.5 mg via ORAL
  Filled 2016-03-03: qty 1

## 2016-03-03 MED ORDER — ACETAMINOPHEN 325 MG PO TABS
650.0000 mg | ORAL_TABLET | Freq: Four times a day (QID) | ORAL | Status: DC | PRN
  Administered 2016-03-04 (×2): 650 mg via ORAL

## 2016-03-03 MED ORDER — ONDANSETRON HCL 4 MG/2ML IJ SOLN: 4.0000 mg | Freq: Four times a day (QID) | INTRAMUSCULAR | Status: DC | PRN

## 2016-03-03 MED ORDER — TRAZODONE HCL 50 MG PO TABS: 25.0000 mg | ORAL_TABLET | Freq: Every evening | ORAL | Status: DC | PRN

## 2016-03-03 MED ORDER — HEPARIN SODIUM (PORCINE) 5000 UNIT/ML IJ SOLN
5000.0000 [IU] | Freq: Three times a day (TID) | INTRAMUSCULAR | Status: DC
  Administered 2016-03-03 – 2016-03-04 (×4): 5000 [IU] via SUBCUTANEOUS
  Filled 2016-03-03 (×5): qty 1

## 2016-03-03 MED ORDER — SEVELAMER CARBONATE 800 MG PO TABS
2400.0000 mg | ORAL_TABLET | Freq: Three times a day (TID) | ORAL | Status: DC
  Administered 2016-03-03 – 2016-03-05 (×4): 2400 mg via ORAL
  Filled 2016-03-03 (×4): qty 3

## 2016-03-03 NOTE — H&P (Signed)
History and Physical    [name] [id number] DOB: 12-03-1965 DOA: 03/03/2016  PCP: [name], MD  Patient coming from:   Home    Chief Complaint: Shortness of breath  HPI: [name] is a 50 y.o. 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  She often cuts sessions short, starts feeling bad. Patient presents to the emergency department with a three-day history of dyspnea, even at rest. Breathing worse when lying flat . She reports a cough with pink tinged sputum. She tried Vicks aerosol without relief . No fevers. Some diffuse chest discomfort when she coughs. Patient has been on dialysis for 6 months, she still makes urine.. Patient states she takes Lasix on an as-needed basis. Her aunt picked up last prescription one month ago and apparently took the Lasix herself.  Patient broke her right arm a year ago, it was unable to be surgically fixed due to anemia per patient. The arm causes her frequent discomfort but patient has been out of pain medications for a month  ED Course:  Tachycardic up to 110, normotensive, normal oxygen saturation on room air Pulmonary edema on chest x-ray POC troponin negative, being at 1298, potassium 4, WBC 2.8 at baseline, including 13 point 6 Given sublingual nitroglycerin times 2 , 324 mg aspirin, nebulizers  Review of Systems: As per HPI, otherwise 10 point review of systems negative.    Past Medical History:  Diagnosis Date  . Anemia   . Anxiety   . Bipolar 1 disorder ([place])   . Bipolar disorder, unspecified ([place]) 02/06/2014  . Chronic combined systolic and diastolic CHF (congestive heart failure) (H[facility])    EF 40% by echo and 48% by stress test  . Chronic pain syndrome   . Depression   . ESRD on hemodialysis ([place])   . GERD (gastroesophageal reflux disease)   . Glaucoma   . Headache    sinus headaches  . Hepatitis C   . High cholesterol   .  History of hiatal hernia   . History of vitamin D deficiency 06/20/2015  . Hypertension   . MRSA infection ?2014   "on the back of my head; spread throughout my bloodstream"  . Neuropathy ([place])   . Refusal of blood transfusions as patient is Jehovah's Witness   . Schizoaffective disorder, unspecified condition 02/08/2014  . Seizure [facility]) dx'd 2014   "don't know what kind; last one was ~ 04/2014"  . Stroke [facility])    TIA 2009  . TIA (transient ischemic attack) 2010  . Type II diabetes mellitus ([place])    INSULIN DEPENDENT    Past Surgical History:  Procedure Laterality Date  . ABDOMINAL HYSTERECTOMY  2014  . ABDOMINAL HYSTERECTOMY  2010  . AMPUTATION Left 05/21/2013   Procedure: Left Midfoot AMPUTATION;  Surgeon: Newt Minion, MD;  Location: [place];  Service: Orthopedics;  Laterality: Left;  Left Midfoot Amputation  . AV FISTULA PLACEMENT Left 05/04/2015   Procedure: ARTERIOVENOUS (AV) GRAFT INSERTION;  Surgeon: Angelia Mould, MD;  Location: [place];  Service: Vascular;  Laterality: Left;  . COLONOSCOPY WITH PROPOFOL N/A 06/22/2013   Procedure: COLONOSCOPY WITH PROPOFOL;  Surgeon: Jeryl Columbia, MD;  Location: [facility] ENDOSCOPY;  Service: Endoscopy;  Laterality: N/A;  . ESOPHAGOGASTRODUODENOSCOPY N/A 05/11/2015   Procedure: ESOPHAGOGASTRODUODENOSCOPY (EGD);  Surgeon: Laurence Spates, MD;  Location: [facility] ENDOSCOPY;  Service: Endoscopy;  Laterality: N/A;  .  EYE SURGERY Bilateral 2010   Lasik  . FOOT AMPUTATION Left    due diabetic neuropathy, could not feel ulcer on bottom of foot  . REFRACTIVE SURGERY Bilateral 2013  . TONSILLECTOMY  1970's  . TUBAL LIGATION    . TUBAL LIGATION  2000    Social History   Social History  . Marital status: Unknown    Spouse name: N/A  . Number of children: N/A  . Years of education: N/A   Occupational History  . Not on file.   Social History Main Topics  . Smoking status: Former Smoker    Packs/day: 1.00    Years: 20.00    Types: Cigarettes     Quit date: 03/31/2015  . Smokeless tobacco: Never Used     Comment: "stopped smoking in ~ 2013; restarted last week; stopped again 06/17/2014"  . Alcohol use 1.8 oz/week    3 Standard drinks or equivalent per week     Comment: 05/01/15 patient denies alcohol use.  . Drug use:     Types: "Crack" cocaine, Cocaine     Comment: last smoked crack 11-07-14  . Sexual activity: Not Currently    Birth control/ protection: Abstinence   Other Topics Concern  . Not on file   Social History Narrative   ** Merged History Encounter **      Patient lives at home. She sometimes uses a cane for ambulation  Allergies  Allergen Reactions  . Gabapentin Itching and Swelling  . Lyrica [Pregabalin] Swelling    Facial and leg swelling  . Other     NO BLOOD PRODUCTS. PT IS A JEHOVAH WITNESS  . Penicillins Hives    Has patient had a PCN reaction causing immediate rash, facial/tongue/throat swelling, SOB or lightheadedness with hypotension: Yes Has patient had a PCN reaction causing severe rash involving mucus membranes or skin necrosis: No Has patient had a PCN reaction that required hospitalization No Has patient had a PCN reaction occurring within the last 10 years: No If all of the above answers are "NO", then may proceed with Cephalosporin use. Pt tolerates cefepime and ceftriaxone  . Saphris [Asenapine] Swelling    Facial swelling  . Tramadol Swelling    Leg swelling    Family History  Problem Relation Age of Onset  . Hypertension Mother   . Hypertension Father     Prior to Admission medications   Medication Sig Start Date End Date Taking? Authorizing Provider  acetaminophen (TYLENOL) 325 MG tablet Take 2 tablets (650 mg total) by mouth every 6 (six) hours as needed for mild pain (or Fever >/= 101). 09/20/15   Delfina Redwood, MD  amLODipine (NORVASC) 10 MG tablet Take 1 tablet (10 mg total) by mouth at bedtime. 01/16/16   Orson Eva, MD  atorvastatin (LIPITOR) 40 MG tablet Take 1 tablet (40  mg total) by mouth at bedtime. 01/16/16   Orson Eva, MD  carvedilol (COREG) 3.125 MG tablet Take 1 tablet (3.125 mg total) by mouth 2 (two) times daily with a meal. Hold if HR <60 01/16/16   Orson Eva, MD  cloNIDine (CATAPRES) 0.1 MG tablet Take 1 tablet (0.1 mg total) by mouth every 8 (eight) hours. 01/16/16   Orson Eva, MD  diphenhydrAMINE (BENADRYL) 25 mg capsule Take 1 capsule (25 mg total) by mouth every 6 (six) hours as needed for itching. 05/07/15   Ripudeep Krystal Eaton, MD  insulin aspart (NOVOLOG) 100 UNIT/ML injection Inject 0-12 Units into the skin 3 (three)  times daily before meals. Per sliding scale inject SQ <150=0units, 151-200=2 units, 201-250=4units, 251-300=6units,301-350=8 units, 351-400=10 units, >/= 401=12 units 01/16/16   Orson Eva, MD  insulin glargine (LANTUS) 100 UNIT/ML injection Inject 0.12 mLs (12 Units total) into the skin at bedtime. 01/16/16   Orson Eva, MD  LORazepam (ATIVAN) 0.5 MG tablet Take 0.5 mg by mouth See admin instructions. Take 1 tablet (0.5 mg) by mouth prior to dialysis and at bedtime as needed for sleep    Historical Provider, MD  polyethylene glycol (MIRALAX / GLYCOLAX) packet Reported on 01/24/2016    Historical Provider, MD  polyvinyl alcohol (LIQUIFILM TEARS) 1.4 % ophthalmic solution Place 2 drops into both eyes every 6 (six) hours as needed for dry eyes.     Historical Provider, MD  sevelamer carbonate (RENVELA) 800 MG tablet Take 3 tablets (2,400 mg total) by mouth 3 (three) times daily with meals. 01/16/16   Orson Eva, MD    Physical Exam: Vitals:   03/03/16 1345 03/03/16 1400 03/03/16 1415 03/03/16 1430  BP: 156/79 157/75 149/72 154/75  Pulse: 103 99 97 96  Resp: 15 15 13 16   TempSrc:      SpO2: 97% 97% 97% 98%  Weight:      Height:        Constitutional:  Pleasant, well-developed black female in NAD, calm, comfortable Vitals:   03/03/16 1345 03/03/16 1400 03/03/16 1415 03/03/16 1430  BP: 156/79 157/75 149/72 154/75  Pulse: 103 99 97 96  Resp: 15  15 13 16   TempSrc:      SpO2: 97% 97% 97% 98%  Weight:      Height:       Eyes: PER, lids and conjunctivae normal ENMT: Mucous membranes are moist. Posterior pharynx clear of any exudate or lesions..  Neck: normal, supple, no masses Respiratory: Bibasilar crackles, no wheezing, normal respiratory effort. No accessory muscle use.  Cardiovascular: Regular rate and rhythm, no murmurs / rubs / gallops. No extremity edema. 2+ pedal pulses.   Abdomen: no tenderness, no masses palpated. No hepatomegaly. Bowel sounds positive.  Musculoskeletal: no clubbing / cyanosis. Partial amputation of right food. Trace BLE edema. Right elbow bone deformity.  Normal muscle tone.  Skin: no rashes, lesions, ulcers.  Neurologic: CN 2-12 grossly intact. Sensation intact, Strength 5/5 in all 4.  Psychiatric: Normal judgment and insight. Alert and oriented x 3. Normal mood.   Labs on Admission: I have personally reviewed following labs and imaging studies   Urine analysis:    Component Value Date/Time   COLORURINE YELLOW 09/17/2015 0108   APPEARANCEUR CLEAR 09/17/2015 0108   LABSPEC 1.013 09/17/2015 0108   PHURINE 8.0 09/17/2015 0108   GLUCOSEU NEGATIVE 09/17/2015 0108   HGBUR SMALL (A) 09/17/2015 0108   BILIRUBINUR NEGATIVE 09/17/2015 0108   BILIRUBINUR neg 12/13/2014 1127   KETONESUR NEGATIVE 09/17/2015 0108   PROTEINUR >300 (A) 09/17/2015 0108   UROBILINOGEN 0.2 05/01/2015 1252   NITRITE NEGATIVE 09/17/2015 0108   LEUKOCYTESUR NEGATIVE 09/17/2015 0108    Radiological Exams on Admission: Dg Chest Port 1 View  Result Date: 03/03/2016 CLINICAL DATA:  Shortness breath, chest pain and cough for 3 days. EXAM: PORTABLE CHEST 1 VIEW COMPARISON:  01/15/2016 FINDINGS: Cardiomediastinal silhouette is enlarged. Mediastinal contours appear intact. There is no evidence of pneumothorax. There is interstitial pulmonary edema. However there is a more confluent airspace consolidation in the right lower lobe. Osseous  structures are without acute abnormality. Soft tissues are grossly normal. IMPRESSION: Right lower lobe airspace  opacity which may represent pneumonia versus asymmetric pulmonary edema. Enlarged cardiac silhouette with interstitial pulmonary edema. Electronically Signed   By: Fidela Salisbury M.D.   On: 03/03/2016 12:17   EKG: Independently reviewed.   EKG Interpretation  Date/Time:  Sunday March 03 2016 11:47:43 EDT Ventricular Rate:  107 PR Interval:    QRS Duration: 105 QT Interval:  373 QTC Calculation: 498 R Axis:   105 Text Interpretation:  Sinus tachycardia Left atrial enlargement Right axis deviation Consider left ventricular hypertrophy Abnormal T, consider ischemia, lateral leads New T wave inversions in V5 and V6 compared to previous tracing January 13, 2016 Confirmed by [place], Raquel Sarna ([id number]) on 03/03/2016 12:31:31 PM     EKG Reviewed with Lottie Mussel, MD   EchoStudy Conclusions  - Left ventricle: The cavity size was normal. There was moderate   concentric hypertrophy. Systolic function was mildly reduced. The   estimated ejection fraction was in the range of 45% to 50%.   Diffuse hypokinesis. Features are consistent with a pseudonormal   left ventricular filling pattern, with concomitant abnormal   relaxation and increased filling pressure (grade 2 diastolic   dysfunction). - Mitral valve: There was mild regurgitation. - Left atrium: The atrium was mildly dilated. Volume/bsa, S: 45.7   ml/m^2. - Atrial septum: A patent foramen ovale cannot be excluded.  Impressions: - Compared to the prior study, there has been no significant   interval change.   Assessment/Plan   Active Problems:   Bipolar disorder (HCC)   Chronic combined systolic and diastolic CHF (congestive heart failure) (HCC)   Closed fracture of part of upper end of humerus   ESRD (end stage renal disease) on dialysis (H[facility])   Chronic pain   Chronic pain syndrome   Depression   Pulmonary edema    Respiratory failure with hypoxia (HCC)      Acute respiratory failure / pulmonary edema in patient with end-stage renal disease on hemodialysis. Cough with pink tinged sputum associated with mild diffuse chest pain. Per EDP patient hypoxic on room air, now in nineties on 3 L nasal cannula.           -Admit to telemetry -Renal already notified by EDP. Patient will be dialyzed today -Continue oxygen per nasal cannula, wean as tolerated  End-stage renal disease on hemodialysis M/W/F. Frequently cuts sessions short.  Presents with dyspnea / pulmonary edema . -For dialysis today  Chronic combined systolic and diastolic heart failure.  Grade 2 diastolic dysfx / EF mildly reduced on echo in June.  -takes lasix prn -fluid management by dialysis -continue coreg, antihypertensives, statins  Uncontrolled diabetes. Hemoglobin 9.4 early May of this year .  -continue home Novolog 2 units before meals -sensitive SSI  -continue home Lantus 25 units HS  Hyperlipidemia.  -continue home statin  Hypertension. BP stable in ED.  -Continue home blood pressure medications  Chronic constipation -Continue home MiraLAX  Hx of bipolar disorder, not on meds  Chronic pain . Chronic right elbow fracture. Out of home oxycodone Rx  x 1 month.  -Oxy IR 5mg  Q6 prn  Jehovah's Witness - no blood transfusion.   Hx of seizures, not on medications now. No recent seizures    DVT prophylaxis:    SQ Heparin    Code Status:     Full code  Family Communication:    none..  Disposition Plan:   Discharge home in 2 days              Consults  called:  Nephrology  Admission status:   Admission -  Telemetry    Tye Savoy NP [facility] Pager [phone]  If 7PM-7AM, please contact night-coverage [email or web address] Password [email or web address]  03/03/2016, 2:48 PM

## 2016-03-03 NOTE — ED Notes (Signed)
Report attempted, nurse to call back

## 2016-03-03 NOTE — ED Notes (Signed)
Pt was removed from neb treatment and oxygen saturation dropped to 83%. MD made aware and 3L [place] applied to pt. o2 sats up to 93%. Pt has normal respirations at this time 18 RR. Hr 110.

## 2016-03-03 NOTE — Consult Note (Signed)
Renal Service Consult Note [facility]  [name] 03/03/2016 Sol Blazing Requesting Physician:  Dr Janne Napoleon  Reason for Consult:  ESRD pt with SOB and pulm edema HPI: The patient is a 50 y.o. year-old with chest pain and SOB for the last 2 days.  Feels like she has " a lot of fluid".  Missed HD on Wed this past week and has been signing off early according to HD records.  Has chron abd pain problems.  Asked to see for dialysis.    Jan 2016 - accel HTN, CKD 4, CM, chest pain April 2016 - seizures, hep C w coma, CKD4, DM2, anemia, HONK, combined s/d CHF, n/v Sept 2016 - HTN urgency, new start to HD, hep C, bipolar, s/d CHF, DM Oct 2016 - abd pain, not clear cause; esrd on HD, bipolar, hep C ?cirrhosis by [place] Feb 2017 - BRBPR, anemia, bipolar/ seizure d/o, combined CHF, esrd, DM, chron pain on opiate, pall care encounter Jun 2017 -  pulm edema/ vol excess rx with HD, ESRD, HTN poor control, DM2, tobacco/ cocaine   ROS  no joint pain   no HA  no blurry vision  no rash  no diarrhea  no nausea/ vomiting  no dysuria  no difficulty voiding  no change in urine color    Past Medical History  Past Medical History:  Diagnosis Date  . Anemia   . Anxiety   . Bipolar 1 disorder ([place])   . Bipolar disorder, unspecified ([place]) 02/06/2014  . Chronic combined systolic and diastolic CHF (congestive heart failure) (HCC)    EF 40% by echo and 48% by stress test  . Chronic pain syndrome   . Depression   . ESRD on hemodialysis ([place])   . GERD (gastroesophageal reflux disease)   . Glaucoma   . Headache    sinus headaches  . Hepatitis C   . High cholesterol   . History of hiatal hernia   . History of vitamin D deficiency 06/20/2015  . Hypertension   . MRSA infection ?2014   "on the back of my head; spread throughout my bloodstream"  . Neuropathy ([place])   . Refusal of blood transfusions as patient is Jehovah's Witness   . Schizoaffective disorder, unspecified condition  02/08/2014  . Seizure [facility]) dx'd 2014   "don't know what kind; last one was ~ 04/2014"  . Stroke [facility])    TIA 2009  . TIA (transient ischemic attack) 2010  . Type II diabetes mellitus ([place])    INSULIN DEPENDENT   Past Surgical History  Past Surgical History:  Procedure Laterality Date  . ABDOMINAL HYSTERECTOMY  2014  . ABDOMINAL HYSTERECTOMY  2010  . AMPUTATION Left 05/21/2013   Procedure: Left Midfoot AMPUTATION;  Surgeon: Newt Minion, MD;  Location: [place];  Service: Orthopedics;  Laterality: Left;  Left Midfoot Amputation  . AV FISTULA PLACEMENT Left 05/04/2015   Procedure: ARTERIOVENOUS (AV) GRAFT INSERTION;  Surgeon: Angelia Mould, MD;  Location: [place];  Service: Vascular;  Laterality: Left;  . COLONOSCOPY WITH PROPOFOL N/A 06/22/2013   Procedure: COLONOSCOPY WITH PROPOFOL;  Surgeon: Jeryl Columbia, MD;  [facility] ENDOSCOPY;  Service: Endoscopy;  Laterality: N/A;  . ESOPHAGOGASTRODUODENOSCOPY N/A 05/11/2015   Procedure: ESOPHAGOGASTRODUODENOSCOPY (EGD);  Surgeon: Laurence Spates, MD;  Location: [facility] ENDOSCOPY;  Service: Endoscopy;  Laterality: N/A;  . EYE SURGERY Bilateral 2010   Lasik  . FOOT AMPUTATION Left    due diabetic neuropathy, could not feel ulcer  on bottom of foot  . REFRACTIVE SURGERY Bilateral 2013  . TONSILLECTOMY  1970's  . TUBAL LIGATION    . TUBAL LIGATION  2000   Family History  Family History  Problem Relation Age of Onset  . Hypertension Mother   . Hypertension Father    Social History  reports that she quit smoking about 11 months ago. Her smoking use included Cigarettes. She has a 20.00 pack-year smoking history. She has never used smokeless tobacco. She reports that she drinks about 1.8 oz of alcohol per week . She reports that she uses drugs, including "Crack" cocaine and Cocaine. Allergies  Allergies  Allergen Reactions  . Gabapentin Itching and Swelling  . Lyrica [Pregabalin] Swelling    Facial and leg swelling  . Other     NO BLOOD  PRODUCTS. PT IS A JEHOVAH WITNESS  . Penicillins Hives    Has patient had a PCN reaction causing immediate rash, facial/tongue/throat swelling, SOB or lightheadedness with hypotension: Yes Has patient had a PCN reaction causing severe rash involving mucus membranes or skin necrosis: No Has patient had a PCN reaction that required hospitalization No Has patient had a PCN reaction occurring within the last 10 years: No If all of the above answers are "NO", then may proceed with Cephalosporin use. Pt tolerates cefepime and ceftriaxone  . Saphris [Asenapine] Swelling    Facial swelling  . Tramadol Swelling    Leg swelling   Home medications Prior to Admission medications   Medication Sig Start Date End Date Taking? Authorizing Provider  acetaminophen (TYLENOL) 325 MG tablet Take 2 tablets (650 mg total) by mouth every 6 (six) hours as needed for mild pain (or Fever >/= 101). 09/20/15  Yes Delfina Redwood, MD  amLODipine (NORVASC) 10 MG tablet Take 1 tablet (10 mg total) by mouth at bedtime. 01/16/16  Yes Orson Eva, MD  atorvastatin (LIPITOR) 40 MG tablet Take 1 tablet (40 mg total) by mouth at bedtime. 01/16/16  Yes Orson Eva, MD  B Complex-C-Folic Acid (RENAL VITAMIN PO) Take 1 tablet by mouth at bedtime.   Yes Historical Provider, MD  carvedilol (COREG) 3.125 MG tablet Take 1 tablet (3.125 mg total) by mouth 2 (two) times daily with a meal. Hold if HR <60 01/16/16  Yes Orson Eva, MD  cloNIDine (CATAPRES) 0.1 MG tablet Take 1 tablet (0.1 mg total) by mouth every 8 (eight) hours. 01/16/16  Yes Orson Eva, MD  diphenhydrAMINE (BENADRYL) 25 mg capsule Take 1 capsule (25 mg total) by mouth every 6 (six) hours as needed for itching. 05/07/15  Yes Ripudeep Krystal Eaton, MD  insulin aspart (NOVOLOG) 100 UNIT/ML injection Inject 0-12 Units into the skin 3 (three) times daily before meals. Per sliding scale inject SQ <150=0units, 151-200=2 units, 201-250=4units, 251-300=6units,301-350=8 units, 351-400=10 units, >/=  401=12 units Patient taking differently: Inject 2-12 Units into the skin 3 (three) times daily before meals. Inject 2 units subcutaneously daily before meals plus sliding scale adjustment based on CBG 01/16/16  Yes David Tat, MD  insulin glargine (LANTUS) 100 UNIT/ML injection Inject 0.12 mLs (12 Units total) into the skin at bedtime. Patient taking differently: Inject 25 Units into the skin at bedtime.  01/16/16  Yes Orson Eva, MD  lidocaine-prilocaine (EMLA) cream Apply 1 application topically See admin instructions. Apply to dialysis site one hour prior to dialysis   Yes Historical Provider, MD  LORazepam (ATIVAN) 0.5 MG tablet Take 0.5 mg by mouth See admin instructions. Take 1 tablet (0.5 mg)  by mouth prior to dialysis and at bedtime as needed for sleep   Yes Historical Provider, MD  polyethylene glycol (MIRALAX / GLYCOLAX) packet Take 17 g by mouth daily as needed (constipation). Mix in 8 oz liquid and drink   Yes Historical Provider, MD  polyvinyl alcohol (LIQUIFILM TEARS) 1.4 % ophthalmic solution Place 2 drops into both eyes every 6 (six) hours as needed for dry eyes.    Yes Historical Provider, MD  sevelamer carbonate (RENVELA) 800 MG tablet Take 3 tablets (2,400 mg total) by mouth 3 (three) times daily with meals. 01/16/16  Yes Orson Eva, MD  Oxycodone HCl 10 MG TABS Take 1 tablet (10 mg total) by mouth every 6 (six) hours as needed (pain). Patient not taking: Reported on 03/03/2016 11/22/15   Estill Dooms, MD   Liver Function Tests No results for input(s): AST, ALT, ALKPHOS, BILITOT, PROT, ALBUMIN in the last 168 hours. No results for input(s): LIPASE, AMYLASE in the last 168 hours. CBC  Recent Labs Lab 03/03/16 1228 03/03/16 1231  WBC 2.8*  --   NEUTROABS 1.4*  --   HGB 11.7* 13.6  HCT 38.2 40.0  MCV 98.2  --   PLT 132*  --    Basic Metabolic Panel  Recent Labs Lab 03/03/16 1228 03/03/16 1231  NA 135 134*  K 4.0 4.0  CL 91* 93*  CO2 28  --   GLUCOSE 336* 338*  BUN 35*  37*  CREATININE 8.34* 8.10*  CALCIUM 8.9  --    Iron/TIBC/Ferritin/ %Sat    Component Value Date/Time   IRON 26 (L) 09/19/2015 0730   TIBC 281 09/19/2015 0730   FERRITIN 316 (H) 05/02/2015 0957   IRONPCTSAT 9 (L) 09/19/2015 0730    Vitals:   03/03/16 1542 03/03/16 1730 03/03/16 2003 03/03/16 2123  BP: (!) 180/81 (!) 187/90 (!) 180/86 (!) 153/70  Pulse: 99  85 86  Resp: 18  20 20   Temp: 97.7 F (36.5 C)  98.1 F (36.7 C) 97.7 F (36.5 C)  TempSrc: Oral  Oral Oral  SpO2: 100%  100% 98%  Weight:      Height:       Exam General: well nourished, NAD Heart: S1, S2, RRR  Lungs: bilat insp rales 1/2 up post Abdomen: soft, swollen, nontender, poss ascites Extremities: trace LE edema Dialysis Access: LFA AVG + bruit    Dialysis: [place] MWF 3h 93min  72.5kg  2/2.25 bath  LFA AVG  Hep none Venofer 50/wk, mircera 225 last 7/19, due 8/2 Hb 11.5, tssat 34%  P 4.6  pth 487 renvela 3 ac tid  Assessment: 1.  Pulm edema/ SOB/ vol overload- for HD tonight 2.  ESRD MWF hd 3.  Hep C 4.  HTN 5.  DM2 6.  Bipolar d/o 7.  Abd pain - signs off early on HD because of abd pain per pt, if she could lower her fluid intake, that would probably could take care of this   Plan - HD tonight, reassess on Monday for further HD as needed  Kelly Splinter MD [place] pager [phone]    cell [phone] 03/03/2016, 10:58 PM

## 2016-03-03 NOTE — ED Notes (Signed)
Admitting at bedside 

## 2016-03-03 NOTE — Progress Notes (Signed)
This RN called the dialysis and talk to BO to inquiry about  when the patient can be picked up for her treatment.

## 2016-03-03 NOTE — ED Notes (Signed)
Pt helped onto the bedside commode.

## 2016-03-03 NOTE — Progress Notes (Signed)
Pt arrived to unit from ER.  Telemetry placed and CCMD notified.  Pt oriented to room including call light and telephone.  Pt denies chest pain at this time.  Pt c/o generalized pain that she states is d/t her chronic pain syndrome.  Rates pain 8/10.  Will notify attending and continue to monitor.

## 2016-03-03 NOTE — ED Triage Notes (Signed)
Pt reports shortness of breath associated with chest pain for the last 2 days. Pt states she feels ,"like I have a lot of fluid of me." Pt reports MWF dialysis-didn't finish treatment on Friday. Pt is alert and ox4. MD at bedside. Able to speak in complete sentences. Pt receiving neb treatment at this time. Pt given 324mg  ASA, 10mg  albuterol, .5 Atrovent, and 1 SL nitro tablet. Pt states chest pain is 6/10 "all over chest" worse with coughing and laying flat.

## 2016-03-03 NOTE — ED Provider Notes (Signed)
[facility] DEPT Provider Note   CSN: [id number] Arrival date & time: 03/03/16  1143  First Provider Contact:  None       History   Chief Complaint Chief Complaint  Patient presents with  . Respiratory Distress    HPI [name] is a 49 y.o. female.  HPI  Patient with history of CHF ESRD on dialysis Monday was a Friday, presents for shortness of breath. Been going on for approximately 2 days, worse today, this is associated with some chest pain.  Last dialysis was 2 days ago, she did not complete dialysis because she didn't feel well the end. She reports this is how dialysis typically goes and she is never able to finish.  EMS thought that she was wheezing and gave her 2 duonebs  Past Medical History:  Diagnosis Date  . Anemia   . Anxiety   . Bipolar 1 disorder ([place])   . Bipolar disorder, unspecified ([place]) 02/06/2014  . Chronic combined systolic and diastolic CHF (congestive heart failure) (HCC)    EF 40% by echo and 48% by stress test  . Chronic pain syndrome   . Depression   . ESRD on hemodialysis ([place])   . GERD (gastroesophageal reflux disease)   . Glaucoma   . Headache    sinus headaches  . Hepatitis C   . High cholesterol   . History of hiatal hernia   . History of vitamin D deficiency 06/20/2015  . Hypertension   . MRSA infection ?2014   "on the back of my head; spread throughout my bloodstream"  . Neuropathy ([place])   . Refusal of blood transfusions as patient is Jehovah's Witness   . Schizoaffective disorder, unspecified condition 02/08/2014  . Seizure [facility]) dx'd 2014   "don't know what kind; last one was ~ 04/2014"  . Stroke [facility])    TIA 2009  . TIA (transient ischemic attack) 2010  . Type II diabetes mellitus ([place])    INSULIN DEPENDENT    Patient Active Problem List   Diagnosis Date Noted  . Acute pulmonary edema ([place]) 01/14/2016  . Pneumonia 01/13/2016  . Hyperkalemia 12/25/2015  . Constipation 12/06/2015  . 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 Dyslipidemia associated with type 2 diabetes mellitus ([place]) 11/10/2015  . Depression   . Palliative care encounter   . Advanced directives, counseling/discussion   . GI bleeding 09/17/2015  . GIB (gastrointestinal bleeding) 09/17/2015  . Schizoaffective disorder ([place])   . Abnormal drug screen (06/19/2015) (Cocaine matabolites detected) 07/04/2015  . Chronic right elbow pain 06/20/2015  . Chronic left foot pain 06/20/2015  . History of TIA (transient ischemic attack) 06/20/2015  . History of stroke 06/20/2015  . History of vitamin D deficiency 06/20/2015  . Substance use disorder Risk: "Very High" 06/20/2015  . Long term prescription opiate use 06/20/2015  . Chronic pain 06/19/2015  . Chronic pain syndrome 06/19/2015  . Cocaine abuse 06/19/2015  . Left forearm pain   . ESRD (end stage renal disease) on dialysis ([place])   . Pancreatitis 05/10/2015  . Left arm pain 05/10/2015  . Diabetes mellitus with stage 5 chronic kidney disease ([place]) 05/10/2015  . Chronic renal failure, stage 5 (HCC)   . Dialysis patient ([place])   . Acute renal failure ([place]) 05/01/2015  . Metabolic acidosis 123456  . Diabetes mellitus with nephropathy ([place]) 05/01/2015  . RUQ abdominal pain 05/01/2015  . Hypertensive urgency 05/01/2015  .  Anemia of chronic kidney failure 05/01/2015  . Transcondylar fracture of distal humerus 12/26/2014  . Impaired renal function 12/26/2014  . Closed fracture of part of upper end of humerus 12/14/2014  . Nontraumatic amputation of left foot ([place]) 12/13/2014  . Unilateral visual loss 11/22/2014  . 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 ([place]) 11/22/2014  . Chronic combined systolic and diastolic CHF (congestive heart failure) ([place])   . Anemia, iron deficiency 04/13/2014  . Schizoaffective disorder, unspecified type ([place]) 02/08/2014  . Bipolar disorder ([place]) 02/06/2014  .  Noncompliance 02/02/2014  . Hep C w/o coma, chronic ([place]) 05/18/2013  . CKD (chronic kidney disease) stage 5, GFR less than 15 ml/min (HCC) 05/18/2013  . Seizure disorder ([place]) 02/09/2013    Past Surgical History:  Procedure Laterality Date  . ABDOMINAL HYSTERECTOMY  2014  . ABDOMINAL HYSTERECTOMY  2010  . AMPUTATION Left 05/21/2013   Procedure: Left Midfoot AMPUTATION;  Surgeon: Newt Minion, MD;  Location: [place];  Service: Orthopedics;  Laterality: Left;  Left Midfoot Amputation  . AV FISTULA PLACEMENT Left 05/04/2015   Procedure: ARTERIOVENOUS (AV) GRAFT INSERTION;  Surgeon: Angelia Mould, MD;  Location: [place];  Service: Vascular;  Laterality: Left;  . COLONOSCOPY WITH PROPOFOL N/A 06/22/2013   Procedure: COLONOSCOPY WITH PROPOFOL;  Surgeon: Jeryl Columbia, MD;  [facility];  Service: Endoscopy;  Laterality: N/A;  . ESOPHAGOGASTRODUODENOSCOPY N/A 05/11/2015   Procedure: ESOPHAGOGASTRODUODENOSCOPY (EGD);  Surgeon: Laurence Spates, MD;  Location: [facility] ENDOSCOPY;  Service: Endoscopy;  Laterality: N/A;  . EYE SURGERY Bilateral 2010   Lasik  . FOOT AMPUTATION Left    due diabetic neuropathy, could not feel ulcer on bottom of foot  . REFRACTIVE SURGERY Bilateral 2013  . TONSILLECTOMY  1970's  . TUBAL LIGATION    . TUBAL LIGATION  2000    OB History    Gravida Para Term Preterm AB Living   0 0 0 0 0     SAB TAB Ectopic Multiple Live Births   0 0 0           Home Medications    Prior to Admission medications   Medication Sig Start Date End Date Taking? Authorizing Provider  acetaminophen (TYLENOL) 325 MG tablet Take 2 tablets (650 mg total) by mouth every 6 (six) hours as needed for mild pain (or Fever >/= 101). 09/20/15   Delfina Redwood, MD  amLODipine (NORVASC) 10 MG tablet Take 1 tablet (10 mg total) by mouth at bedtime. 01/16/16   Orson Eva, MD  atorvastatin (LIPITOR) 40 MG tablet Take 1 tablet (40 mg total) by mouth at bedtime. 01/16/16   Orson Eva, MD    carvedilol (COREG) 3.125 MG tablet Take 1 tablet (3.125 mg total) by mouth 2 (two) times daily with a meal. Hold if HR <60 01/16/16   Orson Eva, MD  cloNIDine (CATAPRES) 0.1 MG tablet Take 1 tablet (0.1 mg total) by mouth every 8 (eight) hours. 01/16/16   Orson Eva, MD  diphenhydrAMINE (BENADRYL) 25 mg capsule Take 1 capsule (25 mg total) by mouth every 6 (six) hours as needed for itching. 05/07/15   Ripudeep Krystal Eaton, MD  insulin aspart (NOVOLOG) 100 UNIT/ML injection Inject 0-12 Units into the skin 3 (three) times daily before meals. Per sliding scale inject SQ <150=0units, 151-200=2 units, 201-250=4units, 251-300=6units,301-350=8 units, 351-400=10 units, >/= 401=12 units 01/16/16   Orson Eva, MD  insulin glargine (LANTUS) 100 UNIT/ML injection Inject 0.12 mLs (12 Units total) into the  skin at bedtime. 01/16/16   Orson Eva, MD  LORazepam (ATIVAN) 0.5 MG tablet Take 0.5 mg by mouth See admin instructions. Take 1 tablet (0.5 mg) by mouth prior to dialysis and at bedtime as needed for sleep    Historical Provider, MD  Oxycodone HCl 10 MG TABS Take 1 tablet (10 mg total) by mouth every 6 (six) hours as needed (pain). 11/22/15   Estill Dooms, MD  polyethylene glycol Merril Abbe / Floria Raveling) packet Reported on 01/24/2016    Historical Provider, MD  polyvinyl alcohol (LIQUIFILM TEARS) 1.4 % ophthalmic solution Place 2 drops into both eyes every 6 (six) hours as needed for dry eyes.     Historical Provider, MD  sevelamer carbonate (RENVELA) 800 MG tablet Take 3 tablets (2,400 mg total) by mouth 3 (three) times daily with meals. 01/16/16   Orson Eva, MD    Family History Family History  Problem Relation Age of Onset  . Hypertension Mother   . Hypertension Father     Social History Social History  Substance Use Topics  . Smoking status: Former Smoker    Packs/day: 1.00    Years: 20.00    Types: Cigarettes    Quit date: 03/31/2015  . Smokeless tobacco: Never Used     Comment: "stopped smoking in ~ 2013;  restarted last week; stopped again 06/17/2014"  . Alcohol use 1.8 oz/week    3 Standard drinks or equivalent per week     Comment: 05/01/15 patient denies alcohol use.     Allergies   Gabapentin; Lyrica [pregabalin]; Other; Penicillins; Saphris [asenapine]; and Tramadol   Review of Systems Review of Systems  Constitutional: Negative for chills and fever.  HENT: Negative for ear pain and sore throat.   Eyes: Negative for pain and visual disturbance.  Respiratory: Positive for shortness of breath. Negative for cough.   Cardiovascular: Positive for chest pain. Negative for palpitations.  Gastrointestinal: Negative for abdominal pain and vomiting.  Genitourinary: Negative for dysuria and hematuria.  Musculoskeletal: Negative for arthralgias and back pain.  Skin: Negative for color change and rash.  Neurological: Negative for seizures and syncope.  All other systems reviewed and are negative.    Physical Exam Updated Vital Signs BP 193/95 (BP Location: Right Arm)   Pulse 108   Resp 24   Ht 5\' 4"  (1.626 m)   Wt 79.4 kg   LMP  (LMP Unknown)   SpO2 96%   BMI 30.04 kg/m   Physical Exam  Constitutional: She appears well-developed and well-nourished. No distress.  HENT:  Head: Normocephalic and atraumatic.  Eyes: Conjunctivae are normal.  Neck: Neck supple.  Cardiovascular: Normal rate and regular rhythm.   No murmur heard. Pulmonary/Chest: Effort normal. No respiratory distress. She has rales.  Abdominal: Soft. There is no tenderness.  Musculoskeletal: She exhibits no edema.  L partial foot amputation  Neurological: She is alert.  Skin: Skin is warm and dry.  Psychiatric: She has a normal mood and affect.  Nursing note and vitals reviewed.    ED Treatments / Results  Labs (all labs ordered are listed, but only abnormal results are displayed) Labs Reviewed  BASIC METABOLIC PANEL  CBC WITH DIFFERENTIAL/PLATELET  BRAIN [place], ED  I-STAT CHEM 8, ED    EKG  EKG Interpretation None       Radiology No results found.  Procedures Procedures (including critical care time)  Medications Ordered in ED Medications  nitroGLYCERIN (NITROSTAT)  SL tablet 0.4 mg (not administered)     Initial Impression / Assessment and Plan / ED Course  I have reviewed the triage vital signs and the nursing notes.  Pertinent labs & imaging results that were available during my care of the patient were reviewed by me and considered in my medical decision making (see chart for details).  Clinical Course    Assessment this patient was medically stable. She was satting in the 80s on room air but in the 90s on 3 L nasal cannula. Chest x-ray is performed did show interstitial infiltrates consistent with pulmonary edema. This consistent patient's physical exam presentation of volume overload. Labs are assessed and the potassium was 4.0. Previous patient's etiology of shortness of breath is acute fluid overload requiring dialysis. Nephrologist consulted and will dialysis patient. Nephrology would like me to have this patient admitted given that she was in pulmonary edema and was hypoxic on room air.  Hospitalist consulted for admission this patient.  Final Clinical Impressions(s) / ED Diagnoses   Final diagnoses:  None    New Prescriptions New Prescriptions   No medications on file     Levada Schilling, MD 03/03/16 Iona Nguyen, MD 03/04/16 2058

## 2016-03-03 NOTE — Progress Notes (Signed)
Lab has called and report that the MESA PCR will come up invalid and they will order a nasal culture which may resulted in 2 days.

## 2016-03-04 ENCOUNTER — Other Ambulatory Visit: Payer: Self-pay

## 2016-03-04 DIAGNOSIS — S42201P Unspecified fracture of upper end of right humerus, subsequent encounter for fracture with malunion: Secondary | ICD-10-CM

## 2016-03-04 DIAGNOSIS — Z992 Dependence on renal dialysis: Secondary | ICD-10-CM

## 2016-03-04 DIAGNOSIS — J9601 Acute respiratory failure with hypoxia: Secondary | ICD-10-CM

## 2016-03-04 DIAGNOSIS — N186 End stage renal disease: Secondary | ICD-10-CM

## 2016-03-04 DIAGNOSIS — F3174 Bipolar disorder, in full remission, most recent episode manic: Secondary | ICD-10-CM

## 2016-03-04 DIAGNOSIS — I5042 Chronic combined systolic (congestive) and diastolic (congestive) heart failure: Secondary | ICD-10-CM

## 2016-03-04 LAB — BASIC METABOLIC PANEL
ANION GAP: 8 (ref 5–15)
BUN: 17 mg/dL (ref 6–20)
CO2: 30 mmol/L (ref 22–32)
Calcium: 8.4 mg/dL — ABNORMAL LOW (ref 8.9–10.3)
Chloride: 97 mmol/L — ABNORMAL LOW (ref 101–111)
Creatinine, Ser: 5.12 mg/dL — ABNORMAL HIGH (ref 0.44–1.00)
GFR calc Af Amer: 10 mL/min — ABNORMAL LOW (ref >60)
GFR, EST NON AFRICAN AMERICAN: 9 mL/min — AB (ref >60)
GLUCOSE: 149 mg/dL — AB (ref 65–99)
POTASSIUM: 5 mmol/L (ref 3.5–5.1)
Sodium: 135 mmol/L (ref 135–145)

## 2016-03-04 LAB — GLUCOSE, CAPILLARY
Glucose-Capillary: 197 mg/dL — ABNORMAL HIGH (ref 65–99)
Glucose-Capillary: 189 mg/dL — ABNORMAL HIGH (ref 65–99)
Glucose-Capillary: 122 mg/dL — ABNORMAL HIGH (ref 65–99)
Glucose-Capillary: 117 mg/dL — ABNORMAL HIGH (ref 65–99)

## 2016-03-04 MED ORDER — SIMETHICONE 40 MG/0.6ML PO SUSP
40.0000 mg | Freq: Four times a day (QID) | ORAL | Status: DC | PRN
  Administered 2016-03-04 (×2): 40 mg via ORAL
  Filled 2016-03-04 (×4): qty 0.6

## 2016-03-04 MED ORDER — ACETAMINOPHEN 325 MG PO TABS
ORAL_TABLET | ORAL | Status: AC
  Filled 2016-03-04: qty 2

## 2016-03-04 NOTE — Progress Notes (Signed)
PROGRESS NOTE    [name]  [id number] DOB: Dec 14, 1965 DOA: 03/03/2016 PCP: [name], MD   Brief Narrative:  Pt is a 50 y.o. yo female with bipolar, substance abuse, HTN, DM who was admitted on 03/03/2016 with SOB/pulmonary edema associated with signing off earlier of HD  and missing treatments. Patient also endorses some diet indiscretion.  Assessment & Plan:  Acute respiratory failure / pulmonary edema in patient with end-stage renal disease on hemodialysis. Cough with pink tinged sputum associated with mild diffuse chest pain. on Admission patient hypoxic on room air, now in mid nineties on 2 L nasal cannula.           -Renal service assisted with HD treatment -will wean oxygen as tolerated -patient advise/encouraged to be compliant with HD treatments.  End-stage renal disease on hemodialysis M/W/F. Frequently cuts sessions short and missed treatments. -had a good treatment on 7/30 night (almost 4L removed) -no SOB now -plan is for short HD treatment on 8/1 and resumption of outpatient schedule on 03/06/16 -appreciate assistance and help by renal service   Chronic combined systolic and diastolic heart failure.  Grade 2 diastolic dysfx / EF mildly reduced on echo in June.  -fluid management by dialysis -continue coreg -advise to be compliant with HD, to check weight on daily basis and to follow low sodium diet   Uncontrolled diabetes. Hemoglobin 9.4 early May 2017.  -continue home Novolog 2 units before meals -sensitive SSI and continue home Lantus 25 units HS -carb modified diet ordered   Hyperlipidemia.  -continue home statin -advise to follow heart healthy diet   Hypertension. BP stable in ED.  -Continue home blood pressure medications -HD will also assist with BP control  Chronic constipation -Continue home MiraLAX  Hx of bipolar disorder,  -not on meds -currently w/o SI or hallucinations  Chronic pain . Chronic right elbow fracture. Out of home  oxycodone Rx  x 1 month.  -Oxy IR 5mg  Q6 prn while inpatient -will need outpatient follow up with orthopedic service  Jehovah's Witness - no blood transfusion.   Hx of seizures, not on medications now.  -No recent seizures reported -will monitor   DVT prophylaxis: Heparin Code Status:  Full Family Communication: no family at bedside  Disposition Plan: home; most likely tomorrow. Will have another short HD treatment and plans to resume outpatient schedule on Wednesday.   Consultants:   Renal service    Procedures:  Inpatient HD See below for x-ray reports    Antimicrobials:  None    Subjective: Feeling much better and breathing easier. Denies CP.  Objective: Vitals:   03/04/16 0300 03/04/16 0334 03/04/16 0605 03/04/16 1346  BP: 123/70 135/78 (!) 145/72 (!) 152/76  Pulse: 79 78 80 77  Resp: 19 20 18 18   Temp:  98 F (36.7 C) 98 F (36.7 C) 97.7 F (36.5 C)  TempSrc:  Oral Oral Oral  SpO2: 96% 96% 100% 98%  Weight:  75.5 kg (166 lb 7.2 oz) 73.4 kg (161 lb 12.8 oz)   Height:        Intake/Output Summary (Last 24 hours) at 03/04/16 1733 Last data filed at 03/04/16 1230  Gross per 24 hour  Intake              600 ml  Output             4000 ml  Net            -3400 ml  Filed Weights   03/03/16 2344 03/04/16 0334 03/04/16 0605  Weight: 79.2 kg (174 lb 9.7 oz) 75.5 kg (166 lb 7.2 oz) 73.4 kg (161 lb 12.8 oz)    Examination: General exam: Appears calm and comfortable; reports very mild sensation of SOB. But able to speak in almost complete full sentences and with great O2 sat on 2L [place] Respiratory system: no wheezing; good air movement, decrease BS at bases, no frank crackles Cardiovascular system: S1 & S2 heard, RRR. No JVD, murmurs, rubs, gallops or clicks. Trace Le edema bilaterally. Gastrointestinal system: Abdomen is nondistended, soft and nontender. No organomegaly or masses felt. Normal bowel sounds heard. Central nervous system: Alert and oriented.  No focal neurological deficits. Extremities: Symmetric 5 x 5 power. Skin: No rashes, lesions or ulcers; left upper graft  Psychiatry: Judgement and insight appear normal. Mood & affect appropriate.    Data Reviewed: I have personally reviewed following labs and imaging studies  CBC:  Recent Labs Lab 03/03/16 1228 03/03/16 1231 03/04/16 0300  WBC 2.8*  --  2.8*  NEUTROABS 1.4*  --   --   HGB 11.7* 13.6 10.6*  HCT 38.2 40.0 34.2*  MCV 98.2  --  96.9  PLT 132*  --  123456*   Basic Metabolic Panel:  Recent Labs Lab 03/03/16 1228 03/03/16 1231 03/04/16 0651  NA 135 134* 135  K 4.0 4.0 5.0  CL 91* 93* 97*  CO2 28  --  30  GLUCOSE 336* 338* 149*  BUN 35* 37* 17  CREATININE 8.34* 8.10* 5.12*  CALCIUM 8.9  --  8.4*   GFR: Estimated Creatinine Clearance: 13.1 mL/min (by C-G formula based on SCr of 5.12 mg/dL).  CBG:  Recent Labs Lab 03/03/16 1629 03/03/16 2125 03/04/16 0829 03/04/16 1139 03/04/16 1642  GLUCAP 358* 326* 197* 189* 122*    Recent Results (from the past 240 hour(s))  MRSA PCR Screening     Status: Abnormal   Collection Time: 03/03/16  5:28 PM  Result Value Ref Range Status   MRSA by PCR INVALID RESULTS, SPECIMEN SENT FOR CULTURE (A) NEGATIVE Final    Comment:        The GeneXpert MRSA Assay (FDA approved for NASAL specimens only), is one component of a comprehensive MRSA colonization surveillance program. It is not intended to diagnose MRSA infection nor to guide or monitor treatment for MRSA infections. RESULT CALLED TO, READ BACK BY AND VERIFIED WITH: SAYE,S RN 03/03/16 2015 WOOTEN,K   MRSA culture     Status: None (Preliminary result)   Collection Time: 03/03/16  5:28 PM  Result Value Ref Range Status   Specimen Description NASAL SWAB  Final   Special Requests NONE  Final   Culture CULTURE REINCUBATED FOR BETTER GROWTH  Final   Report Status PENDING  Incomplete     Radiology Studies: Dg Chest Port 1 View  Result Date:  03/03/2016 CLINICAL DATA:  Shortness breath, chest pain and cough for 3 days. EXAM: PORTABLE CHEST 1 VIEW COMPARISON:  01/15/2016 FINDINGS: Cardiomediastinal silhouette is enlarged. Mediastinal contours appear intact. There is no evidence of pneumothorax. There is interstitial pulmonary edema. However there is a more confluent airspace consolidation in the right lower lobe. Osseous structures are without acute abnormality. Soft tissues are grossly normal. IMPRESSION: Right lower lobe airspace opacity which may represent pneumonia versus asymmetric pulmonary edema. Enlarged cardiac silhouette with interstitial pulmonary edema. Electronically Signed   By: Fidela Salisbury M.D.   On: 03/03/2016 12:17  Scheduled Meds: . amLODipine  10 mg Oral QHS  . atorvastatin  40 mg Oral QHS  . carvedilol  3.125 mg Oral BID WC  . heparin  5,000 Units Subcutaneous Q8H  . insulin aspart  0-9 Units Subcutaneous TID WC  . insulin aspart  2 Units Subcutaneous TID WC  . insulin glargine  25 Units Subcutaneous QHS  . sevelamer carbonate  2,400 mg Oral TID WC   Continuous Infusions:    LOS: 1 day    Time spent: 30 minutes    Barton Dubois, MD [facility] Pager [phone]  If 7PM-7AM, please contact night-coverage [email or web address] Password [facility] 03/04/2016, 5:33 PM

## 2016-03-04 NOTE — Care Management Important Message (Signed)
Important Message  Patient Details  Name: [name] MRN: [id number] Date of Birth: [birth year]   Medicare Important Message Given:  Yes    Pressley Tadesse, [place]oy [place] 03/04/2016, 3:17 PM

## 2016-03-04 NOTE — Progress Notes (Signed)
Pt is in dialysis, CCMD notified that pt is on bay 8.

## 2016-03-04 NOTE — Patient Outreach (Signed)
    Unsuccessful attempt made to patient via telephone. Unsuccessful attempt also made on July 14 (late entry)  Plan: Discharge patient from my caseload Send patient and primary care provider letters notifying them of discharge

## 2016-03-04 NOTE — Progress Notes (Signed)
Subjective:  Received HD in the middle of the night- UF 4000 Objective Vital signs in last 24 hours: Vitals:   03/04/16 0230 03/04/16 0300 03/04/16 0334 03/04/16 0605  BP: 130/80 123/70 135/78 (!) 145/72  Pulse: 80 79 78 80  Resp: 15 19 20 18   Temp:   98 F (36.7 C) 98 F (36.7 C)  TempSrc:   Oral Oral  SpO2: 98% 96% 96% 100%  Weight:   75.5 kg (166 lb 7.2 oz) 73.4 kg (161 lb 12.8 oz)  Height:       Weight change:   Intake/Output Summary (Last 24 hours) at 03/04/16 0905 Last data filed at 03/04/16 0334  Gross per 24 hour  Intake                0 ml  Output             4000 ml  Net            -4000 ml   Dialysis: [place] MWF 3h 74min  72.5kg  2/2.25 bath  LFA AVG  Hep none Venofer 50/wk, mircera 225 last 7/19, due 8/2 Hb 11.5, tssat 34%  P 4.6  pth 487 renvela 3 ac tid  Assessment/ Plan: Pt is a 50 y.o. yo female with bipolar, substance abuse, HTN, DM who was admitted on 03/03/2016 with SOB/pulmonary edema associated with signing off of HD  Assessment/Plan: 1. SOB/pulm edema- improved with HD- on O2- wean as able 2. ESRD - normally MWF via AVG- got HD in the middle of the night - will do another tx off schedule on Tuesday- then Wednesday to get back on schedule (if off O2 tomorrow- may be able to be discharged ) 3. Anemia- hgb greater than 10- received mircera 7/19- due this week 4. Secondary hyperparathyroidism- contnue renvela 5. HTN/volume- had emergent HD in the middle of the night - removed 4000 - doing better/BP better as well- supposedly on norvasc/coreg/clonidine as OP- suspect noncompliance  [name] A    Labs: Basic Metabolic Panel:  Recent Labs Lab 03/03/16 1228 03/03/16 1231 03/04/16 0651  NA 135 134* 135  K 4.0 4.0 5.0  CL 91* 93* 97*  CO2 28  --  30  GLUCOSE 336* 338* 149*  BUN 35* 37* 17  CREATININE 8.34* 8.10* 5.12*  CALCIUM 8.9  --  8.4*   Liver Function Tests: No results for input(s): AST, ALT, ALKPHOS, BILITOT, PROT, ALBUMIN in the  last 168 hours. No results for input(s): LIPASE, AMYLASE in the last 168 hours. No results for input(s): AMMONIA in the last 168 hours. CBC:  Recent Labs Lab 03/03/16 1228 03/03/16 1231 03/04/16 0300  WBC 2.8*  --  2.8*  NEUTROABS 1.4*  --   --   HGB 11.7* 13.6 10.6*  HCT 38.2 40.0 34.2*  MCV 98.2  --  96.9  PLT 132*  --  149*   Cardiac Enzymes: No results for input(s): CKTOTAL, CKMB, CKMBINDEX, TROPONINI in the last 168 hours. CBG:  Recent Labs Lab 03/03/16 1629 03/03/16 2125 03/04/16 0829  GLUCAP 358* 326* 197*    Iron Studies: No results for input(s): IRON, TIBC, TRANSFERRIN, FERRITIN in the last 72 hours. Studies/Results: Dg Chest Port 1 View  Result Date: 03/03/2016 CLINICAL DATA:  Shortness breath, chest pain and cough for 3 days. EXAM: PORTABLE CHEST 1 VIEW COMPARISON:  01/15/2016 FINDINGS: Cardiomediastinal silhouette is enlarged. Mediastinal contours appear intact. There is no evidence of pneumothorax. There is interstitial pulmonary edema. However there is  a more confluent airspace consolidation in the right lower lobe. Osseous structures are without acute abnormality. Soft tissues are grossly normal. IMPRESSION: Right lower lobe airspace opacity which may represent pneumonia versus asymmetric pulmonary edema. Enlarged cardiac silhouette with interstitial pulmonary edema. Electronically Signed   By: Fidela Salisbury M.D.   On: 03/03/2016 12:17  Medications: Infusions:    Scheduled Medications: . amLODipine  10 mg Oral QHS  . atorvastatin  40 mg Oral QHS  . carvedilol  3.125 mg Oral BID WC  . cloNIDine  0.1 mg Oral Q8H  . heparin  5,000 Units Subcutaneous Q8H  . insulin aspart  0-9 Units Subcutaneous TID WC  . insulin aspart  2 Units Subcutaneous TID WC  . insulin glargine  25 Units Subcutaneous QHS  . sevelamer carbonate  2,400 mg Oral TID WC    have reviewed scheduled and prn medications.  Physical Exam: General: I feel much better- "im going to pray  about my cravings"  "maybe I need oxygen at home" Heart: RRR Lungs: more air movement Abdomen: soft,non tender Extremities: ne edema Dialysis Access: left upper graft     03/04/2016,9:05 AM  LOS: 1 day

## 2016-03-05 DIAGNOSIS — G894 Chronic pain syndrome: Secondary | ICD-10-CM

## 2016-03-05 DIAGNOSIS — F329 Major depressive disorder, single episode, unspecified: Secondary | ICD-10-CM

## 2016-03-05 LAB — CBC
HEMATOCRIT: 34.2 % — AB (ref 36.0–46.0)
Hemoglobin: 10.6 g/dL — ABNORMAL LOW (ref 12.0–15.0)
MCH: 30 pg (ref 26.0–34.0)
MCHC: 31 g/dL (ref 30.0–36.0)
MCV: 96.9 fL (ref 78.0–100.0)
Platelets: 149 10*3/uL — ABNORMAL LOW (ref 150–400)
RBC: 3.53 MIL/uL — AB (ref 3.87–5.11)
RDW: 16.3 % — ABNORMAL HIGH (ref 11.5–15.5)
WBC: 2.8 10*3/uL — AB (ref 4.0–10.5)

## 2016-03-05 LAB — GLUCOSE, CAPILLARY
GLUCOSE-CAPILLARY: 201 mg/dL — AB (ref 65–99)
Glucose-Capillary: 124 mg/dL — ABNORMAL HIGH (ref 65–99)

## 2016-03-05 LAB — MRSA CULTURE

## 2016-03-05 LAB — HEPATITIS B SURFACE ANTIGEN: HEP B S AG: NEGATIVE

## 2016-03-05 MED ORDER — SODIUM CHLORIDE 0.9 % IV SOLN: 100.0000 mL | INTRAVENOUS | Status: DC | PRN

## 2016-03-05 MED ORDER — HEPARIN SODIUM (PORCINE) 1000 UNIT/ML DIALYSIS: 1000.0000 [IU] | INTRAMUSCULAR | Status: DC | PRN

## 2016-03-05 MED ORDER — INSULIN GLARGINE 100 UNIT/ML ~~LOC~~ SOLN: 25.0000 [IU] | Freq: Every day | SUBCUTANEOUS | Status: DC

## 2016-03-05 MED ORDER — ALBUTEROL SULFATE (2.5 MG/3ML) 0.083% IN NEBU
2.5000 mg | INHALATION_SOLUTION | Freq: Once | RESPIRATORY_TRACT | Status: AC
  Administered 2016-03-05: 2.5 mg via RESPIRATORY_TRACT

## 2016-03-05 MED ORDER — HEPARIN SODIUM (PORCINE) 1000 UNIT/ML DIALYSIS: 20.0000 [IU]/kg | INTRAMUSCULAR | Status: DC | PRN

## 2016-03-05 MED ORDER — ALTEPLASE 2 MG IJ SOLR: 2.0000 mg | Freq: Once | INTRAMUSCULAR | Status: DC | PRN

## 2016-03-05 MED ORDER — LIDOCAINE HCL (PF) 1 % IJ SOLN: 5.0000 mL | INTRAMUSCULAR | Status: DC | PRN

## 2016-03-05 MED ORDER — ALBUTEROL SULFATE (2.5 MG/3ML) 0.083% IN NEBU
INHALATION_SOLUTION | RESPIRATORY_TRACT | Status: AC
  Filled 2016-03-05: qty 3

## 2016-03-05 MED ORDER — LIDOCAINE-PRILOCAINE 2.5-2.5 % EX CREA: 1.0000 "application " | TOPICAL_CREAM | CUTANEOUS | Status: DC | PRN

## 2016-03-05 MED ORDER — PENTAFLUOROPROP-TETRAFLUOROETH EX AERO: 1.0000 "application " | INHALATION_SPRAY | CUTANEOUS | Status: DC | PRN

## 2016-03-05 NOTE — Procedures (Signed)
Patient was seen on dialysis and the procedure was supervised.  BFR 400  Via AVG BP is  125/75.   Patient appears to be tolerating treatment well  [name] A 03/05/2016

## 2016-03-05 NOTE — Progress Notes (Signed)
Pt. To dialysis via bed by transport team-Cheryl Noberto [facility]; no problems noted.

## 2016-03-05 NOTE — Discharge Summary (Signed)
Physician Discharge Summary  [name] [id number] DOB: 1966-01-21 DOA: 03/03/2016  PCP: [name], MD  Admit date: 03/03/2016 Discharge date: 03/05/2016  Time spent: 35 minutes  Recommendations for Outpatient Follow-up:  Repeat BMET to follow up on electrolytes Please refer to outpatient orthopedic practice of her choice and reassess compliance/pain contract for chronic pain (maybe referral if needed to pain clinic).  Discharge Diagnoses:  Active Problems:   Bipolar disorder (HCC)   Chronic combined systolic and diastolic CHF (congestive heart failure) (HCC)   Closed fracture of part of upper end of humerus   ESRD (end stage renal disease) on dialysis [facility])   Chronic pain   Chronic pain syndrome   Depression   Pulmonary edema   Respiratory failure with hypoxia ([place])   Discharge Condition: stable and improved. Discharge home with instructions to follow up with PCP in 10 days.  Diet recommendation: heart healthy/low sodium diet and modified carbohydrates   Filed Weights   03/05/16 0514 03/05/16 0714 03/05/16 1027  Weight: 74.4 kg (164 lb 1.6 oz) 74.3 kg (163 lb 12.8 oz) 70.3 kg (154 lb 15.7 oz)    History of present illness:  As per H&P written by Dr. Janne Napoleon and NP Tye Savoy on 03/03/16. But briefly; Pt is a 50 y.o.yo femalewith bipolar, substance abuse, HTN, DM who was admitted on 7/30/2017with SOB/pulmonary edema associated with signing off earlier of HD and missing treatments. Patient also endorses some diet indiscretion.  Hospital Course:  Acute respiratory failure / pulmonary edema in patient with end-stage renal disease on hemodialysis. Cough with pink tinged sputum associated with mild diffuse chest pain.on Admission patient hypoxic on room air. -Renal service assisted with HD treatment -oxygen weaned as tolerated and at discharge with excellent O2 sat on RA -patient advise/encouraged to be compliant with HD treatments and to follow low sodium  diet.  End-stage renal disease on hemodialysis M/W/F. Frequently cuts sessions short and missed treatments. -had a good treatment on 7/30 night (almost 4L removed) and then Short HD treatment on 8/1; with plans for resumption of outpatient HD schedule on 03/06/16 -appreciate assistance and help by renal service  -no SOB and good O2 sat on Ra at discharge  Chronic combined systolic and diastolic heart failure.Grade 2 diastolic dysfx / EF mildly reduced on echo in June.  -fluid management by dialysis -continue coreg -advise to be compliant with HD, to check weight on daily basis and to follow low sodium diet   Uncontrolled diabetes. Hemoglobin 9.4 early May 2017.  -continuehome Novolog2 units before meals and continue home Lantus 25 units HS -carb modified diet recommended   Hyperlipidemia.  -continuehome statins -advise to follow heart healthy diet   Hypertension. BP stable in ED.  -Continue home blood pressure medications -HD will also assist with BP control -advise to follow low sodium diet   Chronic constipation -Continue home MiraLAX  Hx of bipolar disorder,  -not on any meds -currently w/o SI or hallucinations  Chronic pain. Chronic right elbow fracture. Out of home oxycodone Rx x 1 month.  -reportedly taken off pain meds, after UDS was positive -recommended to use tylenol as needed for pain -no narcotics prescribed  -will need outpatient follow up with orthopedic service  Chronic anemia in Jehovah's Witness- no blood transfusion.  -continue iron and epogen therapy as per renal service recommendations  Hx of seizures, not on medications now.  -No recent seizures reported and no seizure appreciated during admission   Procedures:  See below for  x-ray reports   Inpatient HD treatments (7/30 and 8/1)  Consultations:  Renal service   Discharge Exam: Vitals:   03/05/16 1000 03/05/16 1027  BP: 137/76 135/75  Pulse: 89 83  Resp: 16 16  Temp:   97.8 F (36.6 C)   General exam: Appears calm and comfortable; reports no CP and no SOB. Good O2 sat on RA.  Respiratory system: no wheezing; good air movement, no crackles. Cardiovascular system: S1 & S2 heard, RRR. No JVD, murmurs, rubs, gallops or clicks. Trace LE edema bilaterally. Gastrointestinal system: Abdomen is nondistended, soft and nontender. No organomegaly or masses felt. Normal bowel sounds heard. Central nervous system: Alert and oriented. No focal neurological deficits. Extremities: Symmetric 5 x 5 power. Skin: No rashes, lesions or ulcers; left upper graft  Psychiatry: Judgement and insight appear normal. Mood & affect appropriate.    Discharge Instructions   Discharge Instructions    Diet - low sodium heart healthy    Complete by:  As directed   Discharge instructions    Complete by:  As directed   Follow low sodium diet Take medications as prescribed Be compliant with your HD treatments Arrange follow up with PCP in 10 days   Increase activity slowly    Complete by:  As directed     Current Discharge Medication List    CONTINUE these medications which have CHANGED   Details  insulin glargine (LANTUS) 100 UNIT/ML injection Inject 0.25 mLs (25 Units total) into the skin at bedtime.      CONTINUE these medications which have NOT CHANGED   Details  acetaminophen (TYLENOL) 325 MG tablet Take 2 tablets (650 mg total) by mouth every 6 (six) hours as needed for mild pain (or Fever >/= 101).    amLODipine (NORVASC) 10 MG tablet Take 1 tablet (10 mg total) by mouth at bedtime. Qty: 30 tablet, Refills: 0    atorvastatin (LIPITOR) 40 MG tablet Take 1 tablet (40 mg total) by mouth at bedtime. Qty: 30 tablet, Refills: 0    B Complex-C-Folic Acid (RENAL VITAMIN PO) Take 1 tablet by mouth at bedtime.    carvedilol (COREG) 3.125 MG tablet Take 1 tablet (3.125 mg total) by mouth 2 (two) times daily with a meal. Hold if HR <60 Qty: 60 tablet, Refills: 0    cloNIDine  (CATAPRES) 0.1 MG tablet Take 1 tablet (0.1 mg total) by mouth every 8 (eight) hours. Qty: 90 tablet, Refills: 0    diphenhydrAMINE (BENADRYL) 25 mg capsule Take 1 capsule (25 mg total) by mouth every 6 (six) hours as needed for itching. Qty: 30 capsule, Refills: 0    insulin aspart (NOVOLOG) 100 UNIT/ML injection Inject 0-12 Units into the skin 3 (three) times daily before meals. Per sliding scale inject SQ <150=0units, 151-200=2 units, 201-250=4units, 251-300=6units,301-350=8 units, 351-400=10 units, >/= 401=12 units Qty: 10 mL, Refills: 0    lidocaine-prilocaine (EMLA) cream Apply 1 application topically See admin instructions. Apply to dialysis site one hour prior to dialysis    LORazepam (ATIVAN) 0.5 MG tablet Take 0.5 mg by mouth See admin instructions. Take 1 tablet (0.5 mg) by mouth prior to dialysis and at bedtime as needed for sleep    polyethylene glycol (MIRALAX / GLYCOLAX) packet Take 17 g by mouth daily as needed (constipation). Mix in 8 oz liquid and drink    polyvinyl alcohol (LIQUIFILM TEARS) 1.4 % ophthalmic solution Place 2 drops into both eyes every 6 (six) hours as needed for dry eyes.  sevelamer carbonate (RENVELA) 800 MG tablet Take 3 tablets (2,400 mg total) by mouth 3 (three) times daily with meals. Qty: 270 tablet, Refills: 0      STOP taking these medications     Oxycodone HCl 10 MG TABS        Allergies  Allergen Reactions  . Gabapentin Itching and Swelling  . Lyrica [Pregabalin] Swelling    Facial and leg swelling  . Other     NO BLOOD PRODUCTS. PT IS A [employer]EHOVAH WITNESS  . Penicillins Hives    Has patient had a PCN reaction causing immediate rash, facial/tongue/throat swelling, SOB or lightheadedness with hypotension: Yes Has patient had a PCN reaction causing severe rash involving mucus membranes or skin necrosis: No Has patient had a PCN reaction that required hospitalization No Has patient had a PCN reaction occurring within the last 10 years:  No If all of the above answers are "NO", then may proceed with Cephalosporin use. Pt tolerates cefepime and ceftriaxone  . Saphris [Asenapine] Swelling    Facial swelling  . Tramadol Swelling    Leg swelling   Follow-up Information    WEBB, CAROL D, MD. Schedule an appointment as soon as possible for a visit in 10 day(s).   Specialty:  Family Medicine Contact information: [address] [place] [address] [phone]           The results of significant diagnostics from this hospitalization (including imaging, microbiology, ancillary and laboratory) are listed below for reference.    Significant Diagnostic Studies: Dg Chest Port 1 View  Result Date: 03/03/2016 CLINICAL DATA:  Shortness breath, chest pain and cough for 3 days. EXAM: PORTABLE CHEST 1 VIEW COMPARISON:  01/15/2016 FINDINGS: Cardiomediastinal silhouette is enlarged. Mediastinal contours appear intact. There is no evidence of pneumothorax. There is interstitial pulmonary edema. However there is a more confluent airspace consolidation in the right lower lobe. Osseous structures are without acute abnormality. Soft tissues are grossly normal. IMPRESSION: Right lower lobe airspace opacity which may represent pneumonia versus asymmetric pulmonary edema. Enlarged cardiac silhouette with interstitial pulmonary edema. Electronically Signed   By: Fidela Salisbury M.D.   On: 03/03/2016 12:17   Microbiology: Recent Results (from the past 240 hour(s))  MRSA PCR Screening     Status: Abnormal   Collection Time: 03/03/16  5:28 PM  Result Value Ref Range Status   MRSA by PCR INVALID RESULTS, SPECIMEN SENT FOR CULTURE (A) NEGATIVE Final    Comment:        The GeneXpert MRSA Assay (FDA approved for NASAL specimens only), is one component of a comprehensive MRSA colonization surveillance program. It is not intended to diagnose MRSA infection nor to guide or monitor treatment for MRSA infections. RESULT  CALLED TO, READ BACK BY AND VERIFIED WITH: SAYE,S RN 03/03/16 2015 WOOTEN,K   MRSA culture     Status: None   Collection Time: 03/03/16  5:28 PM  Result Value Ref Range Status   Specimen Description NASAL SWAB  Final   Special Requests NONE  Final   Culture NO STAPHYLOCOCCUS AUREUS ISOLATED  Final   Report Status 03/05/2016 FINAL  Final     Labs: Basic Metabolic Panel:  Recent Labs Lab 03/03/16 1228 03/03/16 1231 03/04/16 0651  NA 135 134* 135  K 4.0 4.0 5.0  CL 91* 93* 97*  CO2 28  --  30  GLUCOSE 336* 338* 149*  BUN 35* 37* 17  CREATININE 8.34* 8.10* 5.12*  CALCIUM 8.9  --  8.4*   CBC:  Recent Labs Lab 03/03/16 1228 03/03/16 1231 03/04/16 0300  WBC 2.8*  --  2.8*  NEUTROABS 1.4*  --   --   HGB 11.7* 13.6 10.6*  HCT 38.2 40.0 34.2*  MCV 98.2  --  96.9  PLT 132*  --  149*   BNP (last 3 results)  Recent Labs  03/03/16 1228  BNP 1,298.5*   CBG:  Recent Labs Lab 03/04/16 1139 03/04/16 1642 03/04/16 2139 03/05/16 0616 03/05/16 1123  GLUCAP 189* 122* 117* 124* 201*    Signed:  Barton Dubois MD.  [facility] 03/05/2016, 1:53 PM

## 2016-03-05 NOTE — Progress Notes (Signed)
Pt returned from HD. Pt O2 saturation 96% on room air. Pt denies pain. Telemetry notified. Call light within reach, will continue to monitor.   Fritz Pickerel, RN

## 2016-03-05 NOTE — Progress Notes (Signed)
Pt discharged. Discussed with the patient and all questioned fully answered. She will call me if any problems arise.  Educated patient on importance of completing dialysis treatments. Pt verbalizes that she does feel much better after her treatments. Educated on daily weights. Pt verbalizes understanding.    Fritz Pickerel, RN

## 2016-03-05 NOTE — Progress Notes (Signed)
Subjective:  There is a sat of 95% on RA in chart- on O2 here in HD- approaching the end with 3 liters removed so far  Objective Vital signs in last 24 hours: Vitals:   03/05/16 0724 03/05/16 0800 03/05/16 0830 03/05/16 0900  BP: (!) 169/96 (!) 165/99 (!) 184/105 (!) 146/88  Pulse: 85 84 91 86  Resp: 18  17 18   Temp:      TempSrc:      SpO2:      Weight:      Height:       Weight change: -4.944 kg (-10 lb 14.4 oz)  Intake/Output Summary (Last 24 hours) at 03/05/16 [id number] Last data filed at 03/04/16 1230  Gross per 24 hour  Intake              240 ml  Output                0 ml  Net              240 ml   Dialysis: [facility] MWF 3h 54min  72.5kg  2/2.25 bath  LFA AVG  Hep none Venofer 50/wk, mircera 225 last 7/19, due 8/2 Hb 11.5, tssat 34%  P 4.6  pth 487 renvela 3 ac tid  Assessment/ Plan: Pt is a 50 y.o. yo female with bipolar, substance abuse, HTN, DM who was admitted on 03/03/2016 with SOB/pulmonary edema associated with signing off of HD  Assessment/Plan: 1. SOB/pulm edema- improved with HD- on O2- wean as able 2. ESRD - normally MWF via AVG- got HD in the middle of the night Sunday -  another tx off schedule today- then Wednesday to get back on schedule (if off O2 later  may be able to be discharged and get her HD at her home unit tomorrow) 3. Anemia- hgb greater than 10- received mircera 7/19- due this week 4. Secondary hyperparathyroidism- contnue renvela 5. HTN/volume- had emergent HD in the middle of the night - removed 4000 - doing better/BP better as well- supposedly on norvasc/coreg/clonidine as OP- suspect noncompliance- I actually stopped her clonidine because it is bad med for her given her noncompliance   Aleksia Freiman A    Labs: Basic Metabolic Panel:  Recent Labs Lab 03/03/16 1228 03/03/16 1231 03/04/16 0651  NA 135 134* 135  K 4.0 4.0 5.0  CL 91* 93* 97*  CO2 28  --  30  GLUCOSE 336* 338* 149*  BUN 35* 37* 17  CREATININE 8.34* 8.10* 5.12*   CALCIUM 8.9  --  8.4*   Liver Function Tests: No results for input(s): AST, ALT, ALKPHOS, BILITOT, PROT, ALBUMIN in the last 168 hours. No results for input(s): LIPASE, AMYLASE in the last 168 hours. No results for input(s): AMMONIA in the last 168 hours. CBC:  Recent Labs Lab 03/03/16 1228 03/03/16 1231 03/04/16 0300  WBC 2.8*  --  2.8*  NEUTROABS 1.4*  --   --   HGB 11.7* 13.6 10.6*  HCT 38.2 40.0 34.2*  MCV 98.2  --  96.9  PLT 132*  --  149*   Cardiac Enzymes: No results for input(s): CKTOTAL, CKMB, CKMBINDEX, TROPONINI in the last 168 hours. CBG:  Recent Labs Lab 03/04/16 0829 03/04/16 1139 03/04/16 1642 03/04/16 2139 03/05/16 0616  GLUCAP 197* 189* 122* 117* 124*    Iron Studies: No results for input(s): IRON, TIBC, TRANSFERRIN, FERRITIN in the last 72 hours. Studies/Results: Dg Chest Port 1 View  Result Date: 03/03/2016 CLINICAL DATA:  Shortness breath, chest pain and cough for 3 days. EXAM: PORTABLE CHEST 1 VIEW COMPARISON:  01/15/2016 FINDINGS: Cardiomediastinal silhouette is enlarged. Mediastinal contours appear intact. There is no evidence of pneumothorax. There is interstitial pulmonary edema. However there is a more confluent airspace consolidation in the right lower lobe. Osseous structures are without acute abnormality. Soft tissues are grossly normal. IMPRESSION: Right lower lobe airspace opacity which may represent pneumonia versus asymmetric pulmonary edema. Enlarged cardiac silhouette with interstitial pulmonary edema. Electronically Signed   By: Fidela Salisbury M.D.   On: 03/03/2016 12:17  Medications: Infusions:    Scheduled Medications: . albuterol      . amLODipine  10 mg Oral QHS  . atorvastatin  40 mg Oral QHS  . carvedilol  3.125 mg Oral BID WC  . heparin  5,000 Units Subcutaneous Q8H  . insulin aspart  0-9 Units Subcutaneous TID WC  . insulin aspart  2 Units Subcutaneous TID WC  . insulin glargine  25 Units Subcutaneous QHS  .  sevelamer carbonate  2,400 mg Oral TID WC    have reviewed scheduled and prn medications.  Physical Exam: General: I feel much better-  Heart: RRR Lungs: more air movement Abdomen: soft,non tender Extremities: ne edema Dialysis Access: left arm graft     03/05/2016,9:23 AM  LOS: 2 days

## 2016-03-05 NOTE — Plan of Care (Signed)
Problem: Fluid Volume: Goal: Ability to maintain a balanced intake and output will improve Outcome: Progressing Pt. Watched some videos from education channel, and importance of dialysis explained to pt.

## 2016-03-27 ENCOUNTER — Encounter (HOSPITAL_COMMUNITY): Payer: Self-pay | Admitting: Neurology

## 2016-03-27 ENCOUNTER — Emergency Department (HOSPITAL_COMMUNITY): Payer: Medicare Other

## 2016-03-27 ENCOUNTER — Emergency Department (HOSPITAL_COMMUNITY)
Admission: EM | Admit: 2016-03-27 | Discharge: 2016-03-28 | Disposition: A | Discharge status: Home / Self Care | Payer: Medicare Other | Attending: Emergency Medicine | Admitting: Emergency Medicine

## 2016-03-27 DIAGNOSIS — I132 Hypertensive heart and chronic kidney disease with heart failure and with stage 5 chronic kidney disease, or end stage renal disease: Secondary | ICD-10-CM | POA: Diagnosis not present

## 2016-03-27 DIAGNOSIS — Z87891 Personal history of nicotine dependence: Secondary | ICD-10-CM | POA: Insufficient documentation

## 2016-03-27 DIAGNOSIS — E1140 Type 2 diabetes mellitus with diabetic neuropathy, unspecified: Secondary | ICD-10-CM | POA: Diagnosis not present

## 2016-03-27 DIAGNOSIS — Z794 Long term (current) use of insulin: Secondary | ICD-10-CM | POA: Insufficient documentation

## 2016-03-27 DIAGNOSIS — I5042 Chronic combined systolic (congestive) and diastolic (congestive) heart failure: Secondary | ICD-10-CM | POA: Diagnosis not present

## 2016-03-27 DIAGNOSIS — E1122 Type 2 diabetes mellitus with diabetic chronic kidney disease: Secondary | ICD-10-CM | POA: Diagnosis not present

## 2016-03-27 DIAGNOSIS — M79672 Pain in left foot: Secondary | ICD-10-CM | POA: Insufficient documentation

## 2016-03-27 DIAGNOSIS — Z992 Dependence on renal dialysis: Secondary | ICD-10-CM | POA: Diagnosis not present

## 2016-03-27 DIAGNOSIS — Z8673 Personal history of transient ischemic attack (TIA), and cerebral infarction without residual deficits: Secondary | ICD-10-CM | POA: Insufficient documentation

## 2016-03-27 DIAGNOSIS — N186 End stage renal disease: Secondary | ICD-10-CM | POA: Insufficient documentation

## 2016-03-27 LAB — CBC WITH DIFFERENTIAL/PLATELET
BASOS PCT: 0 %
Basophils Absolute: 0 10*3/uL (ref 0.0–0.1)
EOS ABS: 0.1 10*3/uL (ref 0.0–0.7)
EOS PCT: 2 %
HCT: 35.5 % — ABNORMAL LOW (ref 36.0–46.0)
HEMOGLOBIN: 11 g/dL — AB (ref 12.0–15.0)
Lymphocytes Relative: 23 %
Lymphs Abs: 1.2 10*3/uL (ref 0.7–4.0)
MCH: 30.1 pg (ref 26.0–34.0)
MCHC: 31 g/dL (ref 30.0–36.0)
MCV: 97 fL (ref 78.0–100.0)
MONOS PCT: 8 %
Monocytes Absolute: 0.4 10*3/uL (ref 0.1–1.0)
NEUTROS PCT: 67 %
Neutro Abs: 3.4 10*3/uL (ref 1.7–7.7)
PLATELETS: 182 10*3/uL (ref 150–400)
RBC: 3.66 MIL/uL — ABNORMAL LOW (ref 3.87–5.11)
RDW: 15.8 % — ABNORMAL HIGH (ref 11.5–15.5)
WBC: 5.1 10*3/uL (ref 4.0–10.5)

## 2016-03-27 LAB — CBG MONITORING, ED: GLUCOSE-CAPILLARY: 248 mg/dL — AB (ref 65–99)

## 2016-03-27 LAB — BASIC METABOLIC PANEL
Anion gap: 14 (ref 5–15)
BUN: 87 mg/dL — ABNORMAL HIGH (ref 6–20)
CALCIUM: 9.1 mg/dL (ref 8.9–10.3)
CHLORIDE: 94 mmol/L — AB (ref 101–111)
CO2: 25 mmol/L (ref 22–32)
CREATININE: 9.89 mg/dL — AB (ref 0.44–1.00)
GFR, EST AFRICAN AMERICAN: 5 mL/min — AB (ref >60)
GFR, EST NON AFRICAN AMERICAN: 4 mL/min — AB (ref >60)
Glucose, Bld: 259 mg/dL — ABNORMAL HIGH (ref 65–99)
Potassium: 5.5 mmol/L — ABNORMAL HIGH (ref 3.5–5.1)
SODIUM: 133 mmol/L — AB (ref 135–145)

## 2016-03-27 MED ORDER — ACETAMINOPHEN 325 MG PO TABS
650.0000 mg | ORAL_TABLET | Freq: Once | ORAL | Status: AC
  Administered 2016-03-27: 650 mg via ORAL
  Filled 2016-03-27: qty 2

## 2016-03-27 MED ORDER — AMLODIPINE BESYLATE 5 MG PO TABS
10.0000 mg | ORAL_TABLET | Freq: Once | ORAL | Status: AC
  Administered 2016-03-27: 10 mg via ORAL
  Filled 2016-03-27: qty 2

## 2016-03-27 NOTE — ED Provider Notes (Signed)
Emergency Department Provider Note   I have reviewed the triage vital signs and the nursing notes.   HISTORY  Chief Complaint Foot Pain   HPI [name] is a 50 y.o. female with PMH of HTN, DM, CHF, and HLD presents to the ED for evaluation of wound on the bottom of her left foot. She reports frequent pain in this area but has noticed worsening pain and foul odor from the wound. She is on HD for ESRD and missed her HD today because of pain in the foot. There was a partial amputation of the left anterior foot in 2015 but she has not continued to see her orthopedist, podiatrist, or PCP regarding her foot care, DM, or HTN. Denies fever, chills, chest pain, SOB, or nausea.   Past Medical History:  Diagnosis Date  . Anemia   . Anxiety   . Bipolar 1 disorder ([place])   . Bipolar disorder, unspecified ([place]) 02/06/2014  . Chronic combined systolic and diastolic CHF (congestive heart failure) (HCC)    EF 40% by echo and 48% by stress test  . Chronic pain syndrome   . Depression   . ESRD on hemodialysis ([place])   . GERD (gastroesophageal reflux disease)   . Glaucoma   . Headache    sinus headaches  . Hepatitis C   . High cholesterol   . History of hiatal hernia   . History of vitamin D deficiency 06/20/2015  . Hypertension   . MRSA infection ?2014   "on the back of my head; spread throughout my bloodstream"  . Neuropathy ([place])   . Refusal of blood transfusions as patient is Jehovah's Witness   . Schizoaffective disorder, unspecified condition 02/08/2014  . Seizure [facility]) dx'd 2014   "don't know what kind; last one was ~ 04/2014"  . Stroke [facility])    TIA 2009  . TIA (transient ischemic attack) 2010  . Type II diabetes mellitus ([place])    INSULIN DEPENDENT    Patient Active Problem List   Diagnosis Date Noted  . Pulmonary edema 03/03/2016  . Acute respiratory failure ([place]) 03/03/2016  . Respiratory failure with hypoxia ([place]) 03/03/2016  . Acute pulmonary edema ([place]) 01/14/2016  .  Pneumonia 01/13/2016  . Hyperkalemia 12/25/2015  . Constipation 12/06/2015  . 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 Dyslipidemia associated with type 2 diabetes mellitus ([place]) 11/10/2015  . Depression   . Palliative care encounter   . Advanced directives, counseling/discussion   . GI bleeding 09/17/2015  . GIB (gastrointestinal bleeding) 09/17/2015  . Schizoaffective disorder ([place])   . Abnormal drug screen (06/19/2015) (Cocaine matabolites detected) 07/04/2015  . Chronic right elbow pain 06/20/2015  . Chronic left foot pain 06/20/2015  . History of TIA (transient ischemic attack) 06/20/2015  . History of stroke 06/20/2015  . History of vitamin D deficiency 06/20/2015  . Substance use disorder Risk: "Very High" 06/20/2015  . Jessye Imhoff term prescription opiate use 06/20/2015  . Chronic pain 06/19/2015  . Chronic pain syndrome 06/19/2015  . Cocaine abuse 06/19/2015  . Left forearm pain   . ESRD (end stage renal disease) on dialysis ([place])   . Pancreatitis 05/10/2015  . Left arm pain 05/10/2015  . Diabetes mellitus with stage 5 chronic kidney disease ([place]) 05/10/2015  . Chronic renal failure, stage 5 (HCC)   . Dialysis patient ([place])   . Acute renal failure ([place]) 05/01/2015  . Metabolic acidosis [id number]  .  Diabetes mellitus with nephropathy ([place]) 05/01/2015  . RUQ abdominal pain 05/01/2015  . Hypertensive urgency 05/01/2015  . Anemia of chronic kidney failure 05/01/2015  . Transcondylar fracture of distal humerus 12/26/2014  . Impaired renal function 12/26/2014  . Closed fracture of part of upper end of humerus 12/14/2014  . Nontraumatic amputation of left foot ([place]) 12/13/2014  . Unilateral visual loss 11/22/2014  . 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 ([place]) 11/22/2014  . Chronic combined systolic and diastolic CHF (congestive heart failure) ([place])   . Anemia, iron deficiency  04/13/2014  . Schizoaffective disorder, unspecified type ([place]) 02/08/2014  . Bipolar disorder ([place]) 02/06/2014  . Noncompliance 02/02/2014  . Hep C w/o coma, chronic ([place]) 05/18/2013  . CKD (chronic kidney disease) stage 5, GFR less than 15 ml/min (HCC) 05/18/2013  . Seizure disorder ([place]) 02/09/2013    Past Surgical History:  Procedure Laterality Date  . ABDOMINAL HYSTERECTOMY  2014  . ABDOMINAL HYSTERECTOMY  2010  . AMPUTATION Left 05/21/2013   Procedure: Left Midfoot AMPUTATION;  Surgeon: Newt Minion, MD;  Location: [place];  Service: Orthopedics;  Laterality: Left;  Left Midfoot Amputation  . AV FISTULA PLACEMENT Left 05/04/2015   Procedure: ARTERIOVENOUS (AV) GRAFT INSERTION;  Surgeon: Angelia Mould, MD;  Location: [place];  Service: Vascular;  Laterality: Left;  . COLONOSCOPY WITH PROPOFOL N/A 06/22/2013   Procedure: COLONOSCOPY WITH PROPOFOL;  Surgeon: Jeryl Columbia, MD;  Location: [facility];  Service: Endoscopy;  Laterality: N/A;  . ESOPHAGOGASTRODUODENOSCOPY N/A 05/11/2015   Procedure: ESOPHAGOGASTRODUODENOSCOPY (EGD);  Surgeon: Laurence Spates, MD;  Location[facility] ENDOSCOPY;  Service: Endoscopy;  Laterality: N/A;  . EYE SURGERY Bilateral 2010   Lasik  . FOOT AMPUTATION Left    due diabetic neuropathy, could not feel ulcer on bottom of foot  . REFRACTIVE SURGERY Bilateral 2013  . TONSILLECTOMY  1970's  . TUBAL LIGATION    . TUBAL LIGATION  2000    Current Outpatient Rx  . Order #: [id number] Class: No Print  . Order #: [id number] Class: Normal  . Order #: [id number] Class: Normal  . Order #: [id number] Class: Historical Med  . Order #: [id number] Class: Normal  . Order #: [id number] Class: Normal  . Order #: [id number] Class: Print  . Order #: [id number] Class: Print  . Order #: [id number] Class: No Print  . Order #: [id number] Class: Historical Med  . Order #: [id number] Class: Historical Med  . Order #: [id number] Class: Historical Med  . Order #: [id number] Class: Historical Med  .  Order #: [id number] Class: Normal    Allergies Gabapentin; Lyrica [pregabalin]; Other; Penicillins; Saphris [asenapine]; and Tramadol  Family History  Problem Relation Age of Onset  . Hypertension Mother   . Hypertension Father     Social History Social History  Substance Use Topics  . Smoking status: Former Smoker    Packs/day: 1.00    Years: 20.00    Types: Cigarettes    Quit date: 03/31/2015  . Smokeless tobacco: Never Used     Comment: "stopped smoking in ~ 2013; restarted last week; stopped again 06/17/2014"  . Alcohol use 1.8 oz/week    3 Standard drinks or equivalent per week     Comment: 05/01/15 patient denies alcohol use.    Review of Systems  Constitutional: No fever/chills Eyes: No visual changes. ENT: No sore throat. Cardiovascular: Denies chest pain. Respiratory: Denies shortness of breath. Gastrointestinal: No abdominal pain.  No nausea, no vomiting.  No diarrhea.  No constipation. Genitourinary: Negative for dysuria. Musculoskeletal: Negative for back pain.  Left foot pain and ulceration.  Skin: Negative for rash. Neurological: Negative for headaches, focal weakness or numbness.  10-point ROS otherwise negative.  ____________________________________________   PHYSICAL EXAM:  VITAL SIGNS: ED Triage Vitals  Enc Vitals Group     BP 03/27/16 1729 178/83     Pulse Rate 03/27/16 1729 83     Resp 03/27/16 1729 16     Temp 03/27/16 1729 98.3 F (36.8 C)     Temp Source 03/27/16 1729 Oral     SpO2 03/27/16 1729 100 %     Pain Score 03/27/16 1727 10   Constitutional: Alert and oriented. Well appearing and in no acute distress. Eyes: Conjunctivae are normal.  Head: Atraumatic. Nose: No congestion/rhinnorhea. Mouth/Throat: Mucous membranes are moist.  Oropharynx non-erythematous. Neck: No stridor.   Cardiovascular: Normal rate, regular rhythm. Good peripheral circulation. Grossly normal heart sounds.   Respiratory: Normal respiratory effort.  No  retractions. Lungs CTAB. Gastrointestinal: Soft and nontender. No distention.  Musculoskeletal: No lower extremity tenderness nor edema. Partial amputation of left foot with plantar, circular ulceration. No biofilm No crepitus. No erythema or induration. No evidence of abscess. No exposed deep tissue or bone. Neurologic:  Normal speech and language. No gross focal neurologic deficits are appreciated.  Skin:  Skin is warm, dry and intact. No rash noted. Psychiatric: Mood and affect are normal. Speech and behavior are normal.  ____________________________________________   LABS (all labs ordered are listed, but only abnormal results are displayed)  Labs Reviewed  CBC WITH DIFFERENTIAL/PLATELET - Abnormal; Notable for the following:       Result Value   RBC 3.66 (*)    Hemoglobin 11.0 (*)    HCT 35.5 (*)    RDW 15.8 (*)    All other components within normal limits  BASIC METABOLIC PANEL - Abnormal; Notable for the following:    Sodium 133 (*)    Potassium 5.5 (*)    Chloride 94 (*)    Glucose, Bld 259 (*)    BUN 87 (*)    Creatinine, Ser 9.89 (*)    GFR calc non Af Amer 4 (*)    GFR calc Af Amer 5 (*)    All other components within normal limits  CBG MONITORING, ED - Abnormal; Notable for the following:    Glucose-Capillary 248 (*)    All other components within normal limits   ____________________________________________  EKG   EKG Interpretation  Date/Time:  Wednesday March 27 2016 21:32:41 EDT Ventricular Rate:  85 PR Interval:    QRS Duration: 98 QT Interval:  405 QTC Calculation: 482 R Axis:   63 Text Interpretation:  Sinus rhythm Left atrial enlargement Left ventricular hypertrophy No STEMI. Similar to prior.  Confirmed by Kamareon Sciandra MD, [place] [phone]) on 03/27/2016 9:39:58 PM Also confirmed by Abdulai Blaylock MD, Nadia Torr [phone]), editor [place] CT, Leda Gauze [phone])  on 03/28/2016 7:32:27 AM       ____________________________________________  RADIOLOGY  Dg Foot Complete  Left  Result Date: 03/27/2016 CLINICAL DATA:  Left foot ulcer. Patient reports ulcer appeared last night. EXAM: LEFT FOOT - COMPLETE 3+ VIEW COMPARISON:  Radiographs 09/17/2015 FINDINGS: Post transmetatarsal amputation. There is air in the soft tissues about the mid plantar aspect. No tracking soft tissue air. No periosteal reaction or bony destructive change. No radiopaque foreign body. Os navicular again seen. IMPRESSION: Post transmetatarsal amputation. Air within the soft tissues of the resection bed likely site of ulcer. No radiographic findings of osteomyelitis or tracking soft tissue air.  Electronically Signed   By: Jeb Levering M.D.   On: 03/27/2016 22:25    ____________________________________________   PROCEDURES  Procedure(s) performed:   Procedures  None ____________________________________________   INITIAL IMPRESSION / ASSESSMENT AND PLAN / ED COURSE  Pertinent labs & imaging results that were available during my care of the patient were reviewed by me and considered in my medical decision making (see chart for details).  Patient with ESRD on HD and PAD s/p partial amputation of the left foot presents to the ED with worsening ulceration of the sole of the left foot. There is some slightly boggy skin but ulcer is dry with no drainage at this time. No extension to the bone or muscle of the foot. Low suspicion for osteomyelitis but will obtain x-ray for further evaluation. Patient with some hyperkalemia on labs after missing HD today. EKG unchanged from prior with no evidence of hyper-K changes.   11:20 PM No evidence of osteomyelitis in the foot combined with low clinical suspicion. Will discharge home with podiatry referral and wound care follow up along with information on PCP. Patient will attend scheduled HD. No indication for emergent/urgent HD despite missing session today. Will give podiatry referral and PCP information.   At this time, I do not feel there is any  life-threatening condition present. I have reviewed and discussed all results (EKG, imaging, lab, urine as appropriate), exam findings with patient. I have reviewed nursing notes and appropriate previous records.  I feel the patient is safe to be discharged home without further emergent workup. Discussed usual and customary return precautions. Patient and family (if present) verbalize understanding and are comfortable with this plan.  Patient will follow-up with their primary care provider. If they do not have a primary care provider, information for follow-up has been provided to them. All questions have been answered.  ____________________________________________  FINAL CLINICAL IMPRESSION(S) / ED DIAGNOSES  Final diagnoses:  Foot pain, left     MEDICATIONS GIVEN DURING THIS VISIT:  Medications  amLODipine (NORVASC) tablet 10 mg (10 mg Oral Given 03/27/16 2257)  acetaminophen (TYLENOL) tablet 650 mg (650 mg Oral Given 03/27/16 2258)     NEW OUTPATIENT MEDICATIONS STARTED DURING THIS VISIT:  None   Note:  This document was prepared using Dragon voice recognition software and may include unintentional dictation errors.  Nanda Quinton, MD Emergency Medicine   Margette Fast, MD 03/28/16 [phone]

## 2016-03-27 NOTE — Discharge Instructions (Signed)
You were seen in the ED today with foot ulcer. There is no sign of deep infection but given your history you will need to be followed by a foot specialist. Return to the ED with any fever, chills, vomiting, chest pain, or difficulty breathing.   You will also need to return to dialysis at your regularly scheduled time.

## 2016-03-27 NOTE — ED Triage Notes (Signed)
Pt reports callus to bottom of her left foot that started bleeding yesterday and has gotten bigger. Pt is a diabetic and already had her toes and partial foot amputation to left foot. Wound is malodorous and painful. Is dialysis pt and didn't go to her session today because of her foot.

## 2016-04-23 IMAGING — CR DG FOOT COMPLETE 3+V*L*
3 series · 3 of 3 positions shown · non-contrast
Comparison: 04/22/2014

CLINICAL DATA: Left foot pain. Discharge from the amputation stump.

EXAM:
LEFT FOOT - COMPLETE 3+ VIEW

[foot ap]
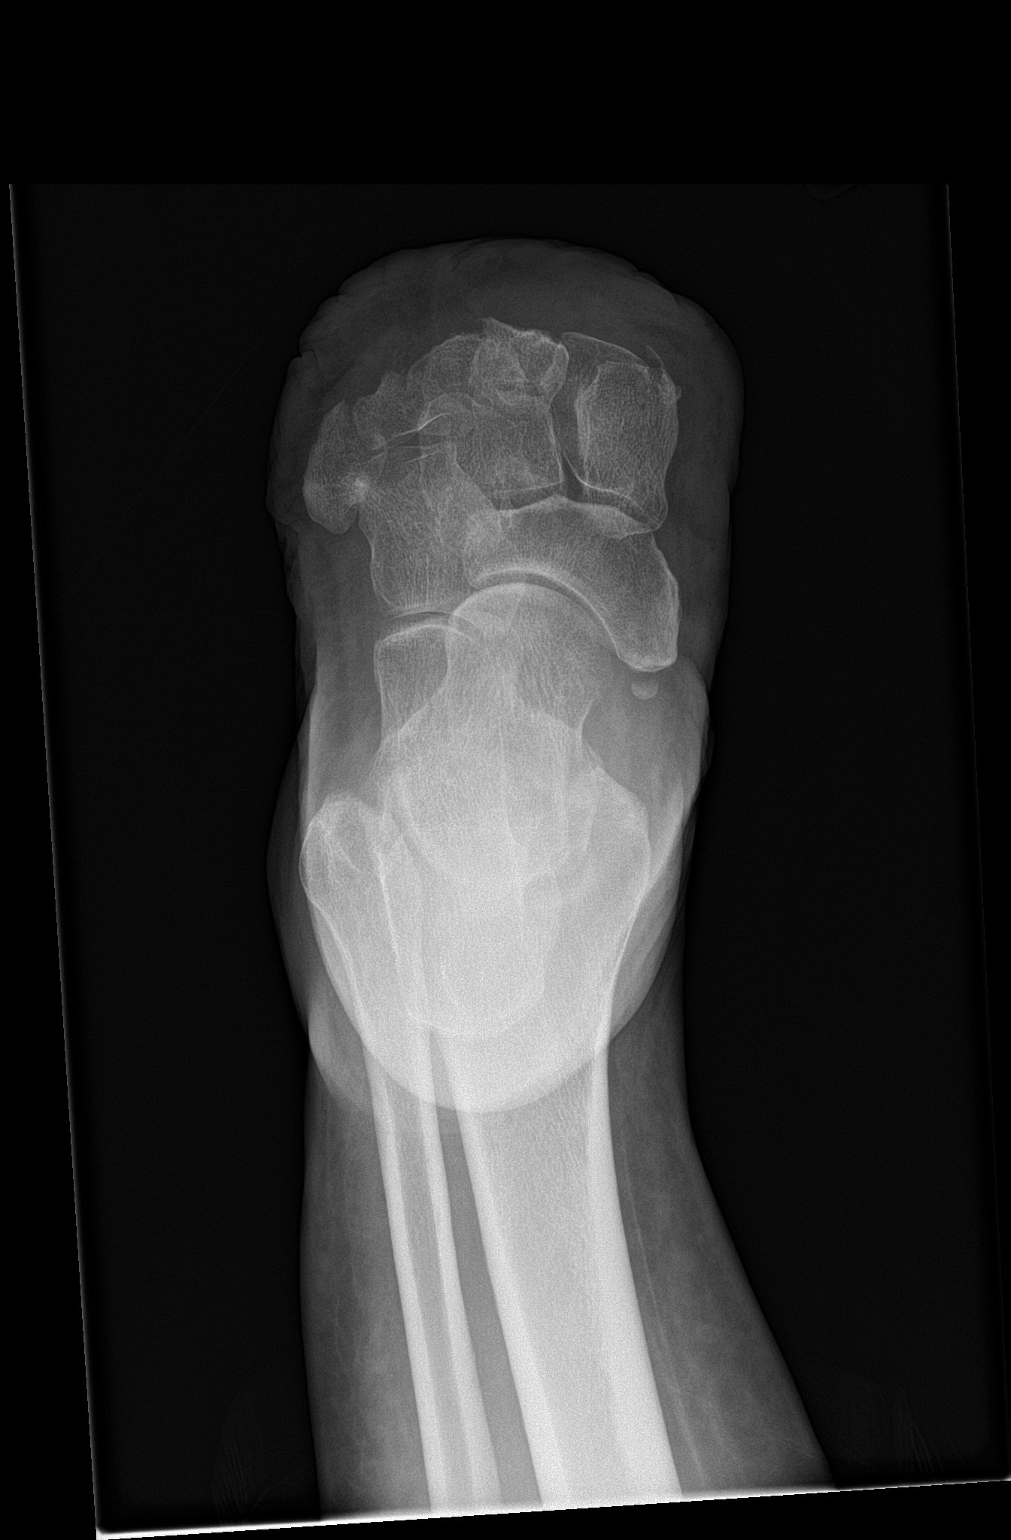

[foot obl]
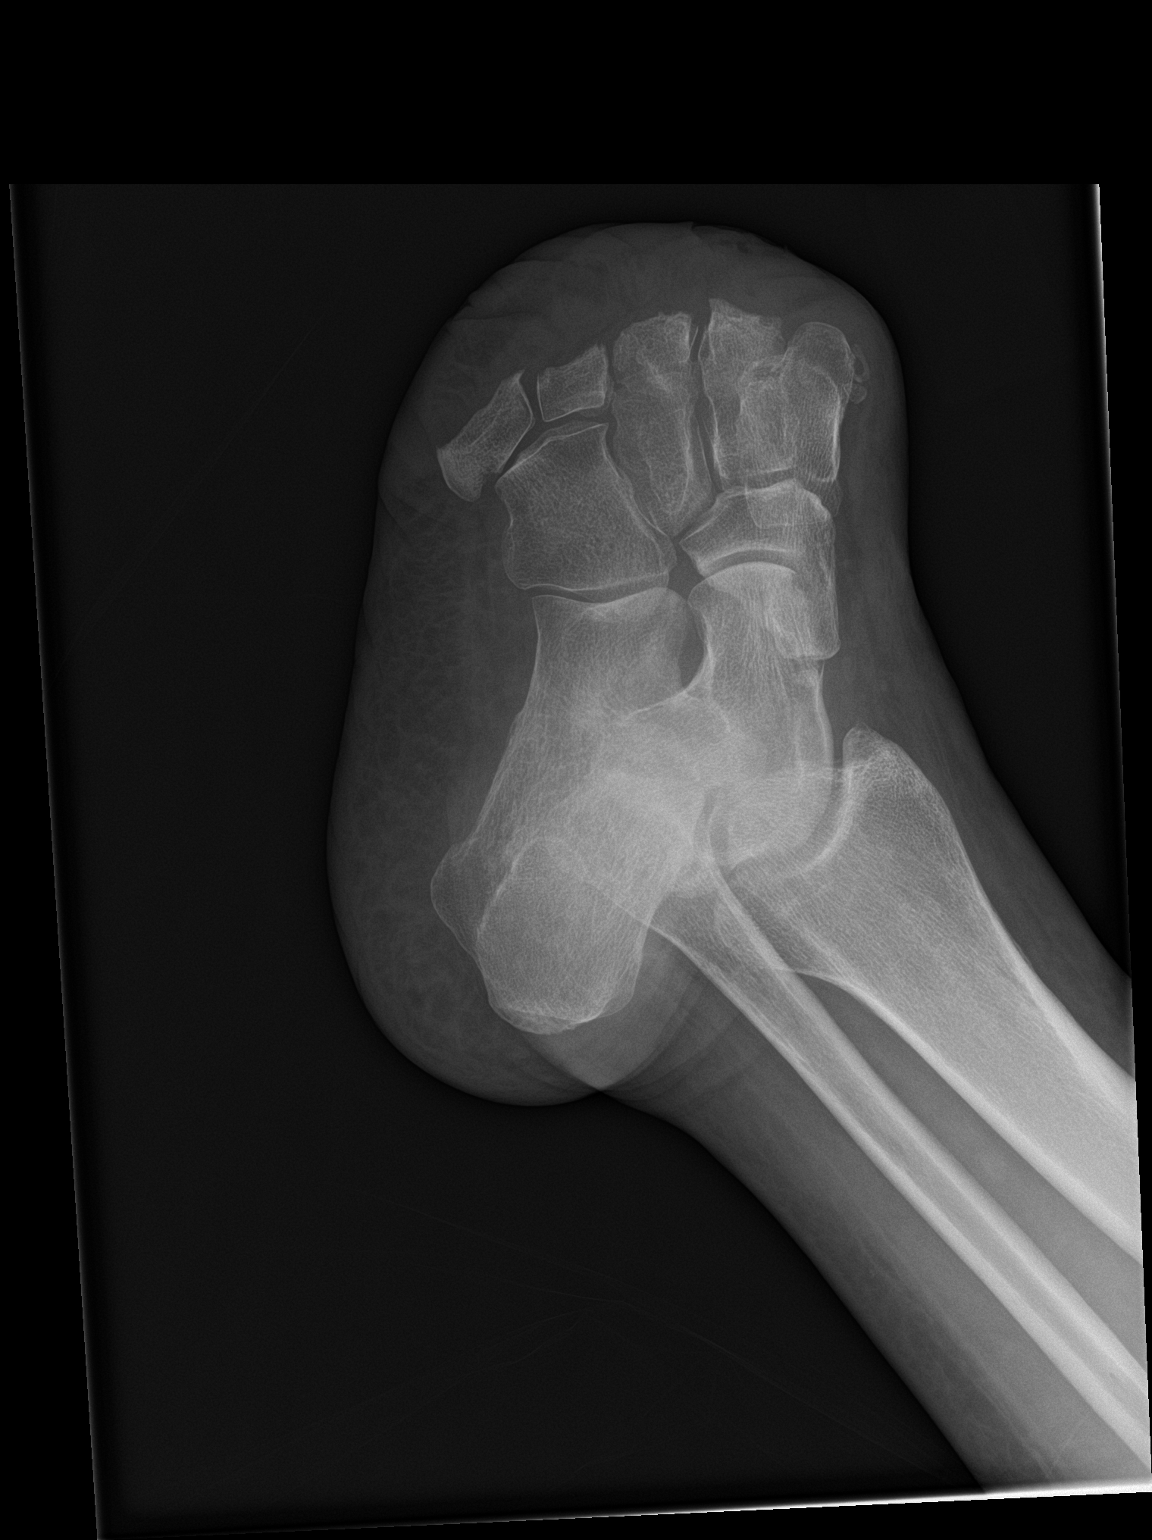

[foot lat]
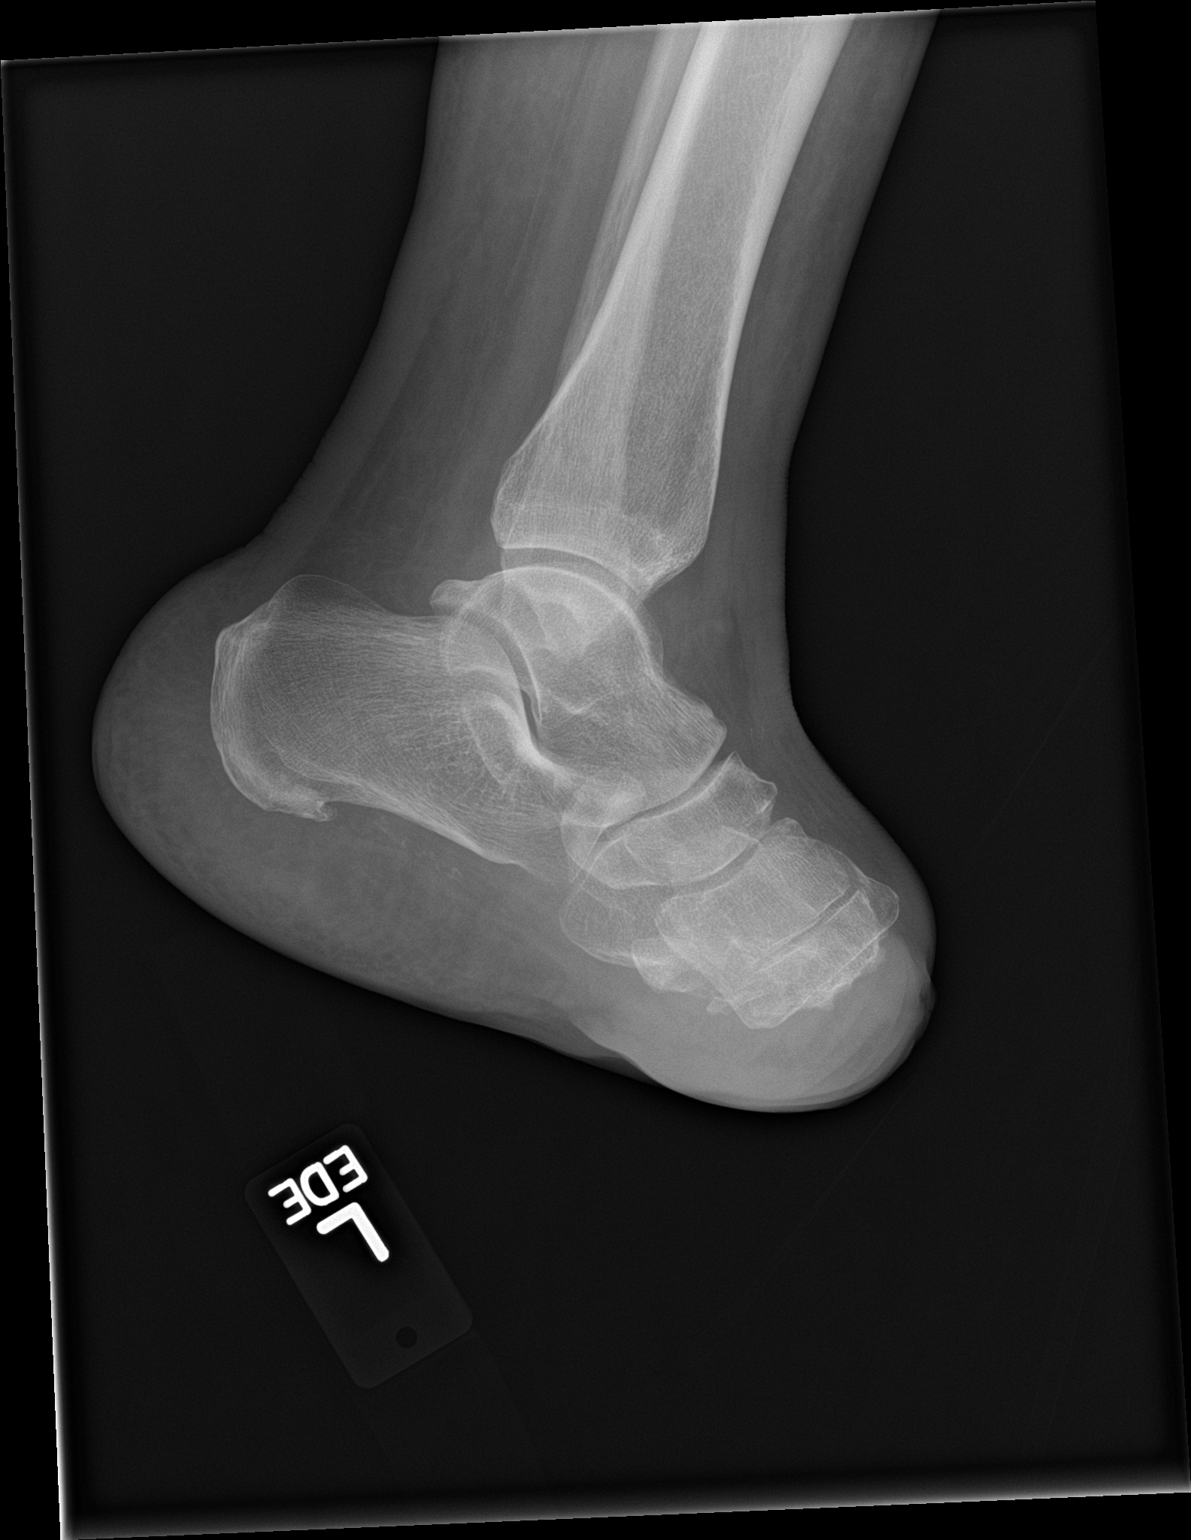

[3 of 3 positions shown; findings below may reference images not displayed]

FINDINGS: Again noted is a left transmetatarsal amputation with a small amount
of residual metatarsal bone. Subtle areas of soft tissue lucency at
the amputation site but no gross abnormality. There is mild
irregularity of the residual metatarsal bones but not significantly
changed from the previous examination. No new areas of periosteal
reaction or cortical destruction. Again noted is a small calcaneal
spur.
IMPRESSION: Stable post amputation changes in the left foot. No radiographic
findings to suggest osteomyelitis. If there is high clinical concern
for osteomyelitis, recommend further characterization with MR.

## 2016-07-16 ENCOUNTER — Encounter: Payer: Self-pay | Admitting: Physician Assistant

## 2016-07-21 ENCOUNTER — Observation Stay (HOSPITAL_COMMUNITY): Payer: Medicare Other

## 2016-07-21 ENCOUNTER — Inpatient Hospital Stay (HOSPITAL_COMMUNITY)
Admission: EM | Admit: 2016-07-21 | Discharge: 2016-07-26 | DRG: 377 | Disposition: A | Discharge status: Home / Self Care | Payer: Medicare Other | Attending: Internal Medicine | Admitting: Internal Medicine

## 2016-07-21 ENCOUNTER — Encounter (HOSPITAL_COMMUNITY): Payer: Self-pay

## 2016-07-21 ENCOUNTER — Emergency Department (HOSPITAL_COMMUNITY): Payer: Medicare Other

## 2016-07-21 DIAGNOSIS — Z888 Allergy status to other drugs, medicaments and biological substances status: Secondary | ICD-10-CM

## 2016-07-21 DIAGNOSIS — E7800 Pure hypercholesterolemia, unspecified: Secondary | ICD-10-CM | POA: Diagnosis present

## 2016-07-21 DIAGNOSIS — E1140 Type 2 diabetes mellitus with diabetic neuropathy, unspecified: Secondary | ICD-10-CM | POA: Diagnosis present

## 2016-07-21 DIAGNOSIS — F319 Bipolar disorder, unspecified: Secondary | ICD-10-CM | POA: Diagnosis present

## 2016-07-21 DIAGNOSIS — D509 Iron deficiency anemia, unspecified: Secondary | ICD-10-CM | POA: Diagnosis present

## 2016-07-21 DIAGNOSIS — D631 Anemia in chronic kidney disease: Secondary | ICD-10-CM | POA: Diagnosis present

## 2016-07-21 DIAGNOSIS — Z794 Long term (current) use of insulin: Secondary | ICD-10-CM

## 2016-07-21 DIAGNOSIS — J9601 Acute respiratory failure with hypoxia: Secondary | ICD-10-CM

## 2016-07-21 DIAGNOSIS — K625 Hemorrhage of anus and rectum: Secondary | ICD-10-CM

## 2016-07-21 DIAGNOSIS — E1122 Type 2 diabetes mellitus with diabetic chronic kidney disease: Secondary | ICD-10-CM | POA: Diagnosis present

## 2016-07-21 DIAGNOSIS — J9691 Respiratory failure, unspecified with hypoxia: Secondary | ICD-10-CM | POA: Diagnosis not present

## 2016-07-21 DIAGNOSIS — F259 Schizoaffective disorder, unspecified: Secondary | ICD-10-CM | POA: Diagnosis present

## 2016-07-21 DIAGNOSIS — N186 End stage renal disease: Secondary | ICD-10-CM | POA: Diagnosis not present

## 2016-07-21 DIAGNOSIS — Z880 Allergy status to penicillin: Secondary | ICD-10-CM

## 2016-07-21 DIAGNOSIS — E8770 Fluid overload, unspecified: Secondary | ICD-10-CM

## 2016-07-21 DIAGNOSIS — S91101A Unspecified open wound of right great toe without damage to nail, initial encounter: Secondary | ICD-10-CM | POA: Diagnosis present

## 2016-07-21 DIAGNOSIS — G40909 Epilepsy, unspecified, not intractable, without status epilepticus: Secondary | ICD-10-CM | POA: Diagnosis present

## 2016-07-21 DIAGNOSIS — G894 Chronic pain syndrome: Secondary | ICD-10-CM | POA: Diagnosis present

## 2016-07-21 DIAGNOSIS — K921 Melena: Secondary | ICD-10-CM | POA: Diagnosis not present

## 2016-07-21 DIAGNOSIS — E785 Hyperlipidemia, unspecified: Secondary | ICD-10-CM | POA: Diagnosis present

## 2016-07-21 DIAGNOSIS — A084 Viral intestinal infection, unspecified: Secondary | ICD-10-CM | POA: Diagnosis present

## 2016-07-21 DIAGNOSIS — R112 Nausea with vomiting, unspecified: Secondary | ICD-10-CM

## 2016-07-21 DIAGNOSIS — Z87891 Personal history of nicotine dependence: Secondary | ICD-10-CM

## 2016-07-21 DIAGNOSIS — E559 Vitamin D deficiency, unspecified: Secondary | ICD-10-CM | POA: Diagnosis present

## 2016-07-21 DIAGNOSIS — Z992 Dependence on renal dialysis: Secondary | ICD-10-CM

## 2016-07-21 DIAGNOSIS — S92401A Displaced unspecified fracture of right great toe, initial encounter for closed fracture: Secondary | ICD-10-CM | POA: Diagnosis present

## 2016-07-21 DIAGNOSIS — X58XXXA Exposure to other specified factors, initial encounter: Secondary | ICD-10-CM | POA: Diagnosis present

## 2016-07-21 DIAGNOSIS — K529 Noninfective gastroenteritis and colitis, unspecified: Secondary | ICD-10-CM | POA: Diagnosis present

## 2016-07-21 DIAGNOSIS — K219 Gastro-esophageal reflux disease without esophagitis: Secondary | ICD-10-CM | POA: Diagnosis present

## 2016-07-21 DIAGNOSIS — E875 Hyperkalemia: Secondary | ICD-10-CM | POA: Diagnosis present

## 2016-07-21 DIAGNOSIS — F1410 Cocaine abuse, uncomplicated: Secondary | ICD-10-CM

## 2016-07-21 DIAGNOSIS — R0989 Other specified symptoms and signs involving the circulatory and respiratory systems: Secondary | ICD-10-CM

## 2016-07-21 DIAGNOSIS — D638 Anemia in other chronic diseases classified elsewhere: Secondary | ICD-10-CM | POA: Diagnosis present

## 2016-07-21 DIAGNOSIS — B182 Chronic viral hepatitis C: Secondary | ICD-10-CM | POA: Diagnosis present

## 2016-07-21 DIAGNOSIS — I5042 Chronic combined systolic (congestive) and diastolic (congestive) heart failure: Secondary | ICD-10-CM | POA: Diagnosis present

## 2016-07-21 DIAGNOSIS — Z79899 Other long term (current) drug therapy: Secondary | ICD-10-CM

## 2016-07-21 DIAGNOSIS — S92424A Nondisplaced fracture of distal phalanx of right great toe, initial encounter for closed fracture: Secondary | ICD-10-CM | POA: Diagnosis present

## 2016-07-21 DIAGNOSIS — R197 Diarrhea, unspecified: Secondary | ICD-10-CM

## 2016-07-21 DIAGNOSIS — Z8673 Personal history of transient ischemic attack (TIA), and cerebral infarction without residual deficits: Secondary | ICD-10-CM

## 2016-07-21 DIAGNOSIS — I132 Hypertensive heart and chronic kidney disease with heart failure and with stage 5 chronic kidney disease, or end stage renal disease: Secondary | ICD-10-CM | POA: Diagnosis present

## 2016-07-21 DIAGNOSIS — E1165 Type 2 diabetes mellitus with hyperglycemia: Secondary | ICD-10-CM | POA: Diagnosis present

## 2016-07-21 DIAGNOSIS — K922 Gastrointestinal hemorrhage, unspecified: Secondary | ICD-10-CM | POA: Diagnosis not present

## 2016-07-21 DIAGNOSIS — E119 Type 2 diabetes mellitus without complications: Secondary | ICD-10-CM

## 2016-07-21 DIAGNOSIS — F419 Anxiety disorder, unspecified: Secondary | ICD-10-CM | POA: Diagnosis present

## 2016-07-21 DIAGNOSIS — D500 Iron deficiency anemia secondary to blood loss (chronic): Secondary | ICD-10-CM

## 2016-07-21 DIAGNOSIS — Z9115 Patient's noncompliance with renal dialysis: Secondary | ICD-10-CM

## 2016-07-21 DIAGNOSIS — N2581 Secondary hyperparathyroidism of renal origin: Secondary | ICD-10-CM | POA: Diagnosis present

## 2016-07-21 DIAGNOSIS — H409 Unspecified glaucoma: Secondary | ICD-10-CM | POA: Diagnosis present

## 2016-07-21 DIAGNOSIS — K5909 Other constipation: Secondary | ICD-10-CM | POA: Diagnosis present

## 2016-07-21 DIAGNOSIS — Z9114 Patient's other noncompliance with medication regimen: Secondary | ICD-10-CM

## 2016-07-21 DIAGNOSIS — Z89432 Acquired absence of left foot: Secondary | ICD-10-CM

## 2016-07-21 DIAGNOSIS — E8779 Other fluid overload: Secondary | ICD-10-CM

## 2016-07-21 DIAGNOSIS — N185 Chronic kidney disease, stage 5: Secondary | ICD-10-CM

## 2016-07-21 DIAGNOSIS — Z79891 Long term (current) use of opiate analgesic: Secondary | ICD-10-CM

## 2016-07-21 LAB — COMPREHENSIVE METABOLIC PANEL
ALBUMIN: 3.4 g/dL — AB (ref 3.5–5.0)
ALT: 22 U/L (ref 14–54)
AST: 35 U/L (ref 15–41)
Alkaline Phosphatase: 83 U/L (ref 38–126)
Anion gap: 19 — ABNORMAL HIGH (ref 5–15)
BUN: 44 mg/dL — AB (ref 6–20)
CHLORIDE: 85 mmol/L — AB (ref 101–111)
CO2: 33 mmol/L — AB (ref 22–32)
CREATININE: 9.23 mg/dL — AB (ref 0.44–1.00)
Calcium: 8.5 mg/dL — ABNORMAL LOW (ref 8.9–10.3)
GFR calc Af Amer: 5 mL/min — ABNORMAL LOW (ref >60)
GFR calc non Af Amer: 4 mL/min — ABNORMAL LOW (ref >60)
Glucose, Bld: 303 mg/dL — ABNORMAL HIGH (ref 65–99)
POTASSIUM: 5.4 mmol/L — AB (ref 3.5–5.1)
SODIUM: 137 mmol/L (ref 135–145)
Total Bilirubin: 1.1 mg/dL (ref 0.3–1.2)
Total Protein: 7.4 g/dL (ref 6.5–8.1)

## 2016-07-21 LAB — PROTIME-INR
INR: 1.35
Prothrombin Time: 16.8 seconds — ABNORMAL HIGH (ref 11.4–15.2)

## 2016-07-21 LAB — CBC
HEMATOCRIT: 27 % — AB (ref 36.0–46.0)
Hemoglobin: 8.1 g/dL — ABNORMAL LOW (ref 12.0–15.0)
MCH: 29.5 pg (ref 26.0–34.0)
MCHC: 30 g/dL (ref 30.0–36.0)
MCV: 98.2 fL (ref 78.0–100.0)
PLATELETS: 191 10*3/uL (ref 150–400)
RBC: 2.75 MIL/uL — ABNORMAL LOW (ref 3.87–5.11)
RDW: 16.3 % — AB (ref 11.5–15.5)
WBC: 4.4 10*3/uL (ref 4.0–10.5)

## 2016-07-21 LAB — POC OCCULT BLOOD, ED: Fecal Occult Bld: POSITIVE — AB

## 2016-07-21 LAB — GLUCOSE, CAPILLARY: GLUCOSE-CAPILLARY: 206 mg/dL — AB (ref 65–99)

## 2016-07-21 MED ORDER — RENA-VITE PO TABS
ORAL_TABLET | Freq: Every day | ORAL | Status: DC
  Administered 2016-07-21 – 2016-07-25 (×5): 1 via ORAL
  Filled 2016-07-21 (×5): qty 1

## 2016-07-21 MED ORDER — DIPHENHYDRAMINE HCL 25 MG PO CAPS
25.0000 mg | ORAL_CAPSULE | Freq: Four times a day (QID) | ORAL | Status: DC | PRN
  Administered 2016-07-21 – 2016-07-24 (×4): 25 mg via ORAL
  Filled 2016-07-21 (×6): qty 1

## 2016-07-21 MED ORDER — ALTEPLASE 2 MG IJ SOLR: 2.0000 mg | Freq: Once | INTRAMUSCULAR | Status: DC | PRN

## 2016-07-21 MED ORDER — INSULIN GLARGINE 100 UNIT/ML ~~LOC~~ SOLN
13.0000 [IU] | Freq: Every day | SUBCUTANEOUS | Status: DC
  Administered 2016-07-21 – 2016-07-25 (×5): 13 [IU] via SUBCUTANEOUS
  Filled 2016-07-21 (×6): qty 0.13

## 2016-07-21 MED ORDER — AMLODIPINE BESYLATE 10 MG PO TABS
10.0000 mg | ORAL_TABLET | Freq: Every day | ORAL | Status: DC
  Administered 2016-07-21 – 2016-07-25 (×5): 10 mg via ORAL
  Filled 2016-07-21 (×5): qty 1

## 2016-07-21 MED ORDER — INSULIN ASPART 100 UNIT/ML ~~LOC~~ SOLN
0.0000 [IU] | Freq: Three times a day (TID) | SUBCUTANEOUS | Status: DC
  Administered 2016-07-22: 1 [IU] via SUBCUTANEOUS
  Administered 2016-07-23: 2 [IU] via SUBCUTANEOUS
  Administered 2016-07-23 – 2016-07-24 (×2): 3 [IU] via SUBCUTANEOUS
  Administered 2016-07-25: 5 [IU] via SUBCUTANEOUS
  Administered 2016-07-26: 3 [IU] via SUBCUTANEOUS
  Administered 2016-07-26: 5 [IU] via SUBCUTANEOUS

## 2016-07-21 MED ORDER — CLONIDINE HCL 0.1 MG PO TABS
0.1000 mg | ORAL_TABLET | Freq: Three times a day (TID) | ORAL | Status: DC
  Administered 2016-07-21 – 2016-07-26 (×13): 0.1 mg via ORAL
  Filled 2016-07-21 (×13): qty 1

## 2016-07-21 MED ORDER — ALBUTEROL SULFATE (2.5 MG/3ML) 0.083% IN NEBU
5.0000 mg | INHALATION_SOLUTION | Freq: Once | RESPIRATORY_TRACT | Status: AC
  Administered 2016-07-21: 5 mg via RESPIRATORY_TRACT
  Filled 2016-07-21: qty 6

## 2016-07-21 MED ORDER — SODIUM CHLORIDE 0.9% FLUSH
3.0000 mL | Freq: Two times a day (BID) | INTRAVENOUS | Status: DC
  Administered 2016-07-21 – 2016-07-22 (×2): 3 mL via INTRAVENOUS

## 2016-07-21 MED ORDER — SEVELAMER CARBONATE 800 MG PO TABS
2400.0000 mg | ORAL_TABLET | Freq: Three times a day (TID) | ORAL | Status: DC
  Administered 2016-07-22 – 2016-07-24 (×5): 2400 mg via ORAL
  Administered 2016-07-24 – 2016-07-25 (×2): 1600 mg via ORAL
  Administered 2016-07-26: 2400 mg via ORAL
  Administered 2016-07-26: 800 mg via ORAL
  Filled 2016-07-21 (×9): qty 3

## 2016-07-21 MED ORDER — POLYVINYL ALCOHOL 1.4 % OP SOLN: 2.0000 [drp] | Freq: Four times a day (QID) | OPHTHALMIC | Status: DC | PRN

## 2016-07-21 MED ORDER — SODIUM CHLORIDE 0.9 % IV SOLN: 100.0000 mL | INTRAVENOUS | Status: DC | PRN

## 2016-07-21 MED ORDER — LORAZEPAM 0.5 MG PO TABS
0.5000 mg | ORAL_TABLET | Freq: Every evening | ORAL | Status: DC | PRN
  Administered 2016-07-21 – 2016-07-22 (×2): 0.5 mg via ORAL
  Filled 2016-07-21 (×2): qty 1

## 2016-07-21 MED ORDER — CLONIDINE HCL 0.1 MG PO TABS
0.1000 mg | ORAL_TABLET | Freq: Once | ORAL | Status: AC
  Administered 2016-07-21: 0.1 mg via ORAL
  Filled 2016-07-21: qty 1

## 2016-07-21 MED ORDER — LIDOCAINE-PRILOCAINE 2.5-2.5 % EX CREA: 1.0000 "application " | TOPICAL_CREAM | CUTANEOUS | Status: DC | PRN

## 2016-07-21 MED ORDER — ACETAMINOPHEN 325 MG PO TABS
650.0000 mg | ORAL_TABLET | Freq: Four times a day (QID) | ORAL | Status: DC | PRN
Start: 2016-07-21 — End: 2016-07-26
  Administered 2016-07-24: 650 mg via ORAL
  Filled 2016-07-21: qty 2

## 2016-07-21 MED ORDER — OXYCODONE HCL 5 MG PO TABS
5.0000 mg | ORAL_TABLET | ORAL | Status: DC | PRN
  Administered 2016-07-21 – 2016-07-24 (×9): 10 mg via ORAL
  Filled 2016-07-21 (×7): qty 2

## 2016-07-21 MED ORDER — MORPHINE SULFATE (PF) 4 MG/ML IV SOLN
2.0000 mg | Freq: Once | INTRAVENOUS | Status: AC
  Administered 2016-07-21: 2 mg via INTRAVENOUS
  Filled 2016-07-21: qty 1

## 2016-07-21 MED ORDER — PENTAFLUOROPROP-TETRAFLUOROETH EX AERO: 1.0000 "application " | INHALATION_SPRAY | CUTANEOUS | Status: DC | PRN

## 2016-07-21 MED ORDER — HEPARIN SODIUM (PORCINE) 1000 UNIT/ML DIALYSIS: 1000.0000 [IU] | INTRAMUSCULAR | Status: DC | PRN

## 2016-07-21 MED ORDER — LIDOCAINE HCL (PF) 1 % IJ SOLN: 5.0000 mL | INTRAMUSCULAR | Status: DC | PRN

## 2016-07-21 MED ORDER — LORAZEPAM 0.5 MG PO TABS: 0.5000 mg | ORAL_TABLET | ORAL | Status: DC

## 2016-07-21 MED ORDER — SENNOSIDES-DOCUSATE SODIUM 8.6-50 MG PO TABS
1.0000 | ORAL_TABLET | Freq: Every evening | ORAL | Status: DC | PRN
  Administered 2016-07-24: 1 via ORAL
  Filled 2016-07-21: qty 1

## 2016-07-21 MED ORDER — ATORVASTATIN CALCIUM 40 MG PO TABS
40.0000 mg | ORAL_TABLET | Freq: Every day | ORAL | Status: DC
  Administered 2016-07-21 – 2016-07-25 (×5): 40 mg via ORAL
  Filled 2016-07-21 (×5): qty 1

## 2016-07-21 MED ORDER — CARVEDILOL 3.125 MG PO TABS
3.1250 mg | ORAL_TABLET | Freq: Two times a day (BID) | ORAL | Status: DC
  Administered 2016-07-21 – 2016-07-26 (×8): 3.125 mg via ORAL
  Filled 2016-07-21 (×8): qty 1

## 2016-07-21 MED ORDER — TRAZODONE HCL 50 MG PO TABS
25.0000 mg | ORAL_TABLET | Freq: Every evening | ORAL | Status: DC | PRN
  Filled 2016-07-21 (×2): qty 1

## 2016-07-21 MED ORDER — LORAZEPAM 0.5 MG PO TABS
0.5000 mg | ORAL_TABLET | ORAL | Status: DC
  Administered 2016-07-25: 0.5 mg via ORAL
  Filled 2016-07-21: qty 1

## 2016-07-21 NOTE — Consult Note (Signed)
Renal Service Consult Note [place] 07/21/2016 [place] Requesting Physician:  Dr Janne Napoleon  Reason for Consult:  ESRD pt with SOB , missed HD HPI: The patient is a 50 y.o. year-old with history of DM2, CVA, HTN, Jehovah's Witness, hepatitis C, combined CHF, bipolar d/o who presents to ER today.  She had some diarrhea and N/V for the last several days and due to this missed HD x 2.  Here with SOB.  CXR with pulm edema and LLL opacity.  No cp, no prod cough, no abd pain.  Asked to see for HD.    Grew up in [place], went to all the high schools, graduated at [employer].  We were "poor", mother was a single parent, 7 kids in the family.  Her mother washed the kids clothes in the bathtub.  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  Retired w onset of dialysis.  Has 3 kids, oldest 35.  No tob / etoh, quit yrs ago.    ROS  denies CP  no joint pain   no HA  no blurry vision  no rash  no diarrhea  no nausea/ vomiting  no dysuria  no difficulty voiding  no change in urine color    Past Medical History  Past Medical History:  Diagnosis Date  . Anemia   . Anxiety   . Bipolar 1 disorder ([place])   . Bipolar disorder, unspecified 02/06/2014  . Chronic combined systolic and diastolic CHF (congestive heart failure) (H[facility])    EF 40% by echo and 48% by stress test  . Chronic pain syndrome   . Depression   . ESRD on hemodialysis ([place])   . GERD (gastroesophageal reflux disease)   . Glaucoma   . Headache    sinus headaches  . Hepatitis C   . High cholesterol   . History of hiatal hernia   . History of vitamin D deficiency 06/20/2015  . Hypertension   . MRSA infection ?2014   "on the back of my head; spread throughout my bloodstream"  . Neuropathy ([place])   . Refusal of blood transfusions as patient is Jehovah's Witness   . Schizoaffective disorder, unspecified  condition 02/08/2014  . Seizure [facility]) dx'd 2014   "don't know what kind; last one was ~ 04/2014"  . Stroke [facility])    TIA 2009  . TIA (transient ischemic attack) 2010  . Type II diabetes mellitus ([place])    INSULIN DEPENDENT   Past Surgical History  Past Surgical History:  Procedure Laterality Date  . ABDOMINAL HYSTERECTOMY  2014  . ABDOMINAL HYSTERECTOMY  2010  . AMPUTATION Left 05/21/2013   Procedure: Left Midfoot AMPUTATION;  Surgeon: Newt Minion, MD;  Location: [facility];  Service: Orthopedics;  Laterality: Left;  Left Midfoot Amputation  . AV FISTULA PLACEMENT Left 05/04/2015   Procedure: ARTERIOVENOUS (AV) GRAFT INSERTION;  Surgeon: Angelia Mould, MD;  Location: [place];  Service: Vascular;  Laterality: Left;  . COLONOSCOPY WITH PROPOFOL N/A 06/22/2013   Procedure: COLONOSCOPY WITH PROPOFOL;  Surgeon: Jeryl Columbia, MD;  Location: [facility] ENDOSCOPY;  Service: Endoscopy;  Laterality: N/A;  . ESOPHAGOGASTRODUODENOSCOPY N/A 05/11/2015   Procedure: ESOPHAGOGASTRODUODENOSCOPY (EGD);  Surgeon: Laurence Spates, MD;  Location: [facility] ENDOSCOPY;  Service: Endoscopy;  Laterality: N/A;  . EYE SURGERY Bilateral 2010   Lasik  . FOOT AMPUTATION Left    due diabetic neuropathy,  could not feel ulcer on bottom of foot  . REFRACTIVE SURGERY Bilateral 2013  . TONSILLECTOMY  1970's  . TUBAL LIGATION    . TUBAL LIGATION  2000   Family History  Family History  Problem Relation Age of Onset  . Hypertension Mother   . Hypertension Father    Social History  reports that she quit smoking about 15 months ago. Her smoking use included Cigarettes. She has a 20.00 pack-year smoking history. She has never used smokeless tobacco. She reports that she drinks about 1.8 oz of alcohol per week . She reports that she uses drugs, including "Crack" cocaine and Cocaine. Allergies  Allergies  Allergen Reactions  . Penicillins Hives and Other (See Comments)    Has patient had a PCN reaction causing immediate rash,  facial/tongue/throat swelling, SOB or lightheadedness with hypotension: Yes Has patient had a PCN reaction causing severe rash involving mucus membranes or skin necrosis: No Has patient had a PCN reaction that required hospitalization No Has patient had a PCN reaction occurring within the last 10 years: No If all of the above answers are "NO", then may proceed with Cephalosporin use. Pt tolerates cefepime and ceftriaxone  . Gabapentin Itching and Swelling  . Lyrica [Pregabalin] Swelling and Other (See Comments)    Facial and leg swelling  . Saphris [Asenapine] Swelling and Other (See Comments)    Facial swelling  . Tramadol Swelling and Other (See Comments)    Leg swelling  . Other Other (See Comments)    NO BLOOD PRODUCTS. PT IS A [place] medications Prior to Admission medications   Medication Sig Start Date End Date Taking? Authorizing Provider  amLODipine (NORVASC) 10 MG tablet Take 1 tablet (10 mg total) by mouth at bedtime. 01/16/16  Yes Orson Eva, MD  atorvastatin (LIPITOR) 40 MG tablet Take 1 tablet (40 mg total) by mouth at bedtime. 01/16/16  Yes Orson Eva, MD  B Complex-C-Folic Acid (RENAL VITAMIN PO) Take 1 tablet by mouth at bedtime.   Yes Historical Provider, MD  carvedilol (COREG) 3.125 MG tablet Take 1 tablet (3.125 mg total) by mouth 2 (two) times daily with a meal. Hold if HR <60 Patient taking differently: Take 25 mg by mouth 2 (two) times daily with a meal. Hold if HR <60 01/16/16  Yes Orson Eva, MD  cloNIDine (CATAPRES) 0.1 MG tablet Take 1 tablet (0.1 mg total) by mouth every 8 (eight) hours. 01/16/16  Yes Orson Eva, MD  diphenhydrAMINE (BENADRYL) 25 mg capsule Take 1 capsule (25 mg total) by mouth every 6 (six) hours as needed for itching. 05/07/15  Yes Ripudeep Krystal Eaton, MD  insulin aspart (NOVOLOG) 100 UNIT/ML injection Inject 0-12 Units into the skin 3 (three) times daily before meals. Per sliding scale inject SQ <150=0units, 151-200=2 units, 201-250=4units,  251-300=6units,301-350=8 units, 351-400=10 units, >/= 401=12 units Patient taking differently: Inject 2-12 Units into the skin 3 (three) times daily before meals. Inject 2 units subcutaneously daily before meals plus sliding scale adjustment based on CBG 01/16/16  Yes David Tat, MD  insulin glargine (LANTUS) 100 UNIT/ML injection Inject 0.25 mLs (25 Units total) into the skin at bedtime. 03/05/16  Yes Barton Dubois, MD  lidocaine-prilocaine (EMLA) cream Apply 1 application topically See admin instructions. Apply to dialysis site one hour prior to dialysis   Yes Historical Provider, MD  LORazepam (ATIVAN) 0.5 MG tablet Take 0.5 mg by mouth See admin instructions. Take 1 tablet (0.5 mg) by mouth prior to dialysis  and at bedtime as needed for sleep   Yes Historical Provider, MD  polyvinyl alcohol (LIQUIFILM TEARS) 1.4 % ophthalmic solution Place 2 drops into both eyes every 6 (six) hours as needed for dry eyes.    Yes Historical Provider, MD  sevelamer carbonate (RENVELA) 800 MG tablet Take 3 tablets (2,400 mg total) by mouth 3 (three) times daily with meals. 01/16/16  Yes Orson Eva, MD  acetaminophen (TYLENOL) 325 MG tablet Take 2 tablets (650 mg total) by mouth every 6 (six) hours as needed for mild pain (or Fever >/= 101). 09/20/15   Delfina Redwood, MD  polyethylene glycol (MIRALAX / GLYCOLAX) packet Take 17 g by mouth daily as needed (constipation). Mix in 8 oz liquid and drink    Historical Provider, MD   Liver Function Tests  Recent Labs Lab 07/21/16 0915  AST 35  ALT 22  ALKPHOS 83  BILITOT 1.1  PROT 7.4  ALBUMIN 3.4*   No results for input(s): LIPASE, AMYLASE in the last 168 hours. CBC  Recent Labs Lab 07/21/16 0915  WBC 4.4  HGB 8.1*  HCT 27.0*  MCV 98.2  PLT 671   Basic Metabolic Panel  Recent Labs Lab 07/21/16 0915  NA 137  K 5.4*  CL 85*  CO2 33*  GLUCOSE 303*  BUN 44*  CREATININE 9.23*  CALCIUM 8.5*   Iron/TIBC/Ferritin/ %Sat    Component Value Date/Time    IRON 26 (L) 09/19/2015 0730   TIBC 281 09/19/2015 0730   FERRITIN 316 (H) 05/02/2015 0957   IRONPCTSAT 9 (L) 09/19/2015 0730    Vitals:   07/21/16 1215 07/21/16 1230 07/21/16 1245 07/21/16 1300  BP: 182/86 189/92 191/84 179/91  Pulse: 84 83 81 79  Resp: 25 18 17  (!) 27  Temp:      TempSrc:      SpO2: 100% 99% 100% 100%   Exam Gen alert, on nasal O2, ^WOB mild-mod No rash, cyanosis or gangrene Sclera anicteric, throat clear +JVD, no bruits Chest rales and bronchial BS on L, rales 2/3 up on R RRR no MRG  Abd soft ntnd no mass or ascites +bs  GU defer MS no joint effusions or deformity Ext 1-2+ pretib edema / R great toe ulcer dry no drainage L foot TMA well-healed Neuro is alert, Ox 3 , nf LFA AVF+bruit   Dialysis: TTS  3.5h   72kg  2/2.25 bath  Hep none  LFA AVF  Assessment: 1  Dyspnea / pulm edema - diffuse pulm edema and LLL opacity, likely effusion.  Needs HD today acutely.  May need bipap if deteriorates before HD can be started (1-2 hours).   2  ESRD HD TTS, missed HD x 2 for GI illnesss 3  DM2 on insulin 4  HTN on medication 5  Hx CVA 6  Jehovah's witness 7  Hx bipolar    Plan -  HD today upstairs, may need temporary bipap if gets worse soon  Kelly Splinter MD [place] pager [phone]   07/21/2016, 1:52 PM

## 2016-07-21 NOTE — Progress Notes (Signed)
Dialysis treatment completed.  4000 mL ultrafiltrated and net fluid removal 3500 mL.    Patient status unchanged. Lung sounds diminished to ausculation in all fields. No edema. Cardiac: NSR.  Disconnected lines and removed needles.  Pressure held for 10 minutes and band aid/gauze dressing applied.  Report given to bedside RN, Claiborne Billings.

## 2016-07-21 NOTE — Progress Notes (Signed)
Patient arrived to unit per bed.  Reviewed treatment plan and this RN agrees.  Report received from bedside RN, Claiborne Billings.  Consent obtained.  Patient A & O X 4. Lung sounds diminished to ausculation in all fields. No edema. Cardiac: NSR.  Prepped LLAVG with alcohol and cannulated with two 15 gauge needles.  Pulsation of blood noted.  Flushed access well with saline per protocol.  Connected and secured lines and initiated tx at 1550.  UF goal of 4000 mL and net fluid removal of 3500 mL.  Will continue to monitor.

## 2016-07-21 NOTE — H&P (Signed)
History and Physical    [name] WJX:[id number] DOB: [birth year] DOA: 07/21/2016   PCP: [name], MD   Patient coming from:  Home    Chief Complaint: Nausea, vomiting, diarrhea , rectal beed, shortness of breath   HPI: [name] is a 50 y.o. female with very extensive medical history listed below brought by EMS due to nausea, vomiting and diarrhea,  over the last 2 days with intermittent blogging her stools. No apparent sick contacts. She denies any food poisoning. She does complain of cramp. Some of her stools are bloody. Of note, she was to see a G.I. physician next week for evaluation of similar G.I. symptoms. She denies any fever chills or night sweats. She denies any dizziness or vertigo. She does have neuropathy, and she has an open wound on her right great toe after hitting her heater yesterday due to lack of sensation in her toes. Patient has not been compliant with her meds and has not been attending to her HD sessions due to the above complaints.    ED Course:  BP 177/72   Pulse 91   Temp 98.6 F (37 C) (Oral)   Resp 21   LMP  (LMP Unknown)   SpO2 99%    EKG without abnormalities  chest x-ray is clear  Her O sats  were low in the 80s on room air, and her blood pressure was elevated to 228/110 prior to arrival, with elevated CBG is at 413, in the setting of no medicine  compliance. She was on 4 L of oxygen on arrival.  Currently her VSS and afebrile, her Osats are at 99 in 2 L   sodium 137 potassium 5.4 BUN 44 creatinine 9.23 Anion Gap 19  T bili 1.1  WbC 4.4  Hb 8.1 and plt 191  Glu 303  Hcult positive,, but has had + Hcult for over 1 year   Systolic function was mildly reduced.  EKG  45% to 50%   Diffuse hypokinesis.  grade 2 diastolic  dysfunction .  Review of Systems: As per HPI otherwise 10 point review of systems negative.   Past Medical History:  Diagnosis Date  . Anemia   . Anxiety   . Bipolar 1 disorder ([place])   . Bipolar disorder, unspecified  02/06/2014  . Chronic combined systolic and diastolic CHF (congestive heart failure) (HCC)    EF 40% by echo and 48% by stress test  . Chronic pain syndrome   . Depression   . ESRD on hemodialysis ([place])   . GERD (gastroesophageal reflux disease)   . Glaucoma   . Headache    sinus headaches  . Hepatitis C   . High cholesterol   . History of hiatal hernia   . History of vitamin D deficiency 06/20/2015  . Hypertension   . MRSA infection ?2014   "on the back of my head; spread throughout my bloodstream"  . Neuropathy ([place])   . Refusal of blood transfusions as patient is Jehovah's Witness   . Schizoaffective disorder, unspecified condition 02/08/2014  . Seizure [facility]) dx'd 2014   "don't know what kind; last one was ~ 04/2014"  . Stroke [facility])    TIA 2009  . TIA (transient ischemic attack) 2010  . Type II diabetes mellitus ([place])    INSULIN DEPENDENT    Past Surgical History:  Procedure Laterality Date  . ABDOMINAL HYSTERECTOMY  2014  . ABDOMINAL HYSTERECTOMY  2010  . AMPUTATION Left 05/21/2013  Procedure: Left Midfoot AMPUTATION;  Surgeon: Newt Minion, MD;  Location: [facility];  Service: Orthopedics;  Laterality: Left;  Left Midfoot Amputation  . AV FISTULA PLACEMENT Left 05/04/2015   Procedure: ARTERIOVENOUS (AV) GRAFT INSERTION;  Surgeon: Angelia Mould, MD;  Location: [facility];  Service: Vascular;  Laterality: Left;  . COLONOSCOPY WITH PROPOFOL N/A 06/22/2013   Procedure: COLONOSCOPY WITH PROPOFOL;  Surgeon: Jeryl Columbia, MD;  Location: [facility] ENDOSCOPY;  Service: Endoscopy;  Laterality: N/A;  . ESOPHAGOGASTRODUODENOSCOPY N/A 05/11/2015   Procedure: ESOPHAGOGASTRODUODENOSCOPY (EGD);  Surgeon: Laurence Spates, MD;  Location: [facility] ENDOSCOPY;  Service: Endoscopy;  Laterality: N/A;  . EYE SURGERY Bilateral 2010   Lasik  . FOOT AMPUTATION Left    due diabetic neuropathy, could not feel ulcer on bottom of foot  . REFRACTIVE SURGERY Bilateral 2013  . TONSILLECTOMY  1970's  . TUBAL LIGATION      . TUBAL LIGATION  2000    Social History Social History   Social History  . Marital status: Single    Spouse name: N/A  . Number of children: N/A  . Years of education: N/A   Occupational History  . Not on file.   Social History Main Topics  . Smoking status: Former Smoker    Packs/day: 1.00    Years: 20.00    Types: Cigarettes    Quit date: 03/31/2015  . Smokeless tobacco: Never Used     Comment: "stopped smoking in ~ 2013; restarted last week; stopped again 06/17/2014"  . Alcohol use 1.8 oz/week    3 Standard drinks or equivalent per week     Comment: 05/01/15 patient denies alcohol use.  . Drug use:     Types: "Crack" cocaine, Cocaine     Comment: last smoked crack 11-07-14  . Sexual activity: Not Currently    Birth control/ protection: Abstinence   Other Topics Concern  . Not on file   Social History Narrative   ** Merged History Encounter **         Allergies  Allergen Reactions  . Penicillins Hives and Other (See Comments)    Has patient had a PCN reaction causing immediate rash, facial/tongue/throat swelling, SOB or lightheadedness with hypotension: Yes Has patient had a PCN reaction causing severe rash involving mucus membranes or skin necrosis: No Has patient had a PCN reaction that required hospitalization No Has patient had a PCN reaction occurring within the last 10 years: No If all of the above answers are "NO", then may proceed with Cephalosporin use. Pt tolerates cefepime and ceftriaxone  . Gabapentin Itching and Swelling  . Lyrica [Pregabalin] Swelling and Other (See Comments)    Facial and leg swelling  . Saphris [Asenapine] Swelling and Other (See Comments)    Facial swelling  . Tramadol Swelling and Other (See Comments)    Leg swelling  . Other Other (See Comments)    NO BLOOD PRODUCTS. 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 Hypertension Mother   . Hypertension Father       Prior to Admission  medications   Medication Sig Start Date End Date Taking? Authorizing Provider  amLODipine (NORVASC) 10 MG tablet Take 1 tablet (10 mg total) by mouth at bedtime. 01/16/16  Yes Orson Eva, MD  atorvastatin (LIPITOR) 40 MG tablet Take 1 tablet (40 mg total) by mouth at bedtime. 01/16/16  Yes Orson Eva, MD  B Complex-C-Folic Acid (RENAL VITAMIN PO) Take 1  tablet by mouth at bedtime.   Yes Historical Provider, MD  carvedilol (COREG) 3.125 MG tablet Take 1 tablet (3.125 mg total) by mouth 2 (two) times daily with a meal. Hold if HR <60 Patient taking differently: Take 25 mg by mouth 2 (two) times daily with a meal. Hold if HR <60 01/16/16  Yes Orson Eva, MD  cloNIDine (CATAPRES) 0.1 MG tablet Take 1 tablet (0.1 mg total) by mouth every 8 (eight) hours. 01/16/16  Yes Orson Eva, MD  diphenhydrAMINE (BENADRYL) 25 mg capsule Take 1 capsule (25 mg total) by mouth every 6 (six) hours as needed for itching. 05/07/15  Yes Ripudeep Krystal Eaton, MD  insulin aspart (NOVOLOG) 100 UNIT/ML injection Inject 0-12 Units into the skin 3 (three) times daily before meals. Per sliding scale inject SQ <150=0units, 151-200=2 units, 201-250=4units, 251-300=6units,301-350=8 units, 351-400=10 units, >/= 401=12 units Patient taking differently: Inject 2-12 Units into the skin 3 (three) times daily before meals. Inject 2 units subcutaneously daily before meals plus sliding scale adjustment based on CBG 01/16/16  Yes David Tat, MD  insulin glargine (LANTUS) 100 UNIT/ML injection Inject 0.25 mLs (25 Units total) into the skin at bedtime. 03/05/16  Yes Barton Dubois, MD  lidocaine-prilocaine (EMLA) cream Apply 1 application topically See admin instructions. Apply to dialysis site one hour prior to dialysis   Yes Historical Provider, MD  LORazepam (ATIVAN) 0.5 MG tablet Take 0.5 mg by mouth See admin instructions. Take 1 tablet (0.5 mg) by mouth prior to dialysis and at bedtime as needed for sleep   Yes Historical Provider, MD  polyvinyl alcohol  (LIQUIFILM TEARS) 1.4 % ophthalmic solution Place 2 drops into both eyes every 6 (six) hours as needed for dry eyes.    Yes Historical Provider, MD  sevelamer carbonate (RENVELA) 800 MG tablet Take 3 tablets (2,400 mg total) by mouth 3 (three) times daily with meals. 01/16/16  Yes Orson Eva, MD  acetaminophen (TYLENOL) 325 MG tablet Take 2 tablets (650 mg total) by mouth every 6 (six) hours as needed for mild pain (or Fever >/= 101). 09/20/15   Delfina Redwood, MD  polyethylene glycol (MIRALAX / GLYCOLAX) packet Take 17 g by mouth daily as needed (constipation). Mix in 8 oz liquid and drink    Historical Provider, MD    Physical Exam:    Vitals:   07/21/16 1035 07/21/16 1045 07/21/16 1100 07/21/16 1121  BP:  (!) 202/98 (!) 208/87 177/72  Pulse:  90 91   Resp:  (!) 28 21   Temp:      TempSrc:      SpO2: 98% 100% 99%        Constitutional: NAD, anxious, uncomfortable due to nausea and SoB  Vitals:   07/21/16 1035 07/21/16 1045 07/21/16 1100 07/21/16 1121  BP:  (!) 202/98 (!) 208/87 177/72  Pulse:  90 91   Resp:  (!) 28 21   Temp:      TempSrc:      SpO2: 98% 100% 99%    Eyes: PERRL, lids and conjunctivae normal ENMT: Mucous membranes are moist. Posterior pharynx clear of any exudate or lesions.Normal dentition.  Neck: normal, supple, no masses, no thyromegaly Respiratory: crackles noted difusely, no wheezing or rhonchi  Normal respiratory effort. No accessory muscle use.  Cardiovascular: Regular rate and rhythm, no murmurs / rubs / gallops. No extremity edema. 2+ pedal pulses. No carotid bruits.  Abdomen: no tenderness, no masses palpated. No hepatosplenomegaly. Bowel sounds active .  Musculoskeletal: +  clubbing / cyanosis. No joint deformity upper and lower extremities. Good ROM, no contractures. Normal muscle tone.  Skin: Remarkable for right food with right great toe wound, with mild citizen continues drainage, but no purely material noted, toe somewhat tender, but no  erythema is seen. Left great toe amputation. Neurologic: CN 2-12 grossly intact. Sensation intact, DTR normal. Strength 5/5 in all 4.  Psychiatric: Normal judgment and insight. Alert and oriented x 3. Anxious l mood.     Labs on Admission: I have personally reviewed following labs and imaging studies  CBC:  Recent Labs Lab 07/21/16 0915  WBC 4.4  HGB 8.1*  HCT 27.0*  MCV 98.2  PLT 789    Basic Metabolic Panel:  Recent Labs Lab 07/21/16 0915  NA 137  K 5.4*  CL 85*  CO2 33*  GLUCOSE 303*  BUN 44*  CREATININE 9.23*  CALCIUM 8.5*    GFR: CrCl cannot be calculated (Unknown ideal weight.).  Liver Function Tests:  Recent Labs Lab 07/21/16 0915  AST 35  ALT 22  ALKPHOS 83  BILITOT 1.1  PROT 7.4  ALBUMIN 3.4*   No results for input(s): LIPASE, AMYLASE in the last 168 hours. No results for input(s): AMMONIA in the last 168 hours.  Coagulation Profile:  Recent Labs Lab 07/21/16 0915  INR 1.35    Cardiac Enzymes: No results for input(s): CKTOTAL, CKMB, CKMBINDEX, TROPONINI in the last 168 hours.  BNP (last 3 results) No results for input(s): PROBNP in the last 8760 hours.  HbA1C: No results for input(s): HGBA1C in the last 72 hours.  CBG: No results for input(s): GLUCAP in the last 168 hours.  Lipid Profile: No results for input(s): CHOL, HDL, LDLCALC, TRIG, CHOLHDL, LDLDIRECT in the last 72 hours.  Thyroid Function Tests: No results for input(s): TSH, T4TOTAL, FREET4, T3FREE, THYROIDAB in the last 72 hours.  Anemia Panel: No results for input(s): VITAMINB12, FOLATE, FERRITIN, TIBC, IRON, RETICCTPCT in the last 72 hours.  Urine analysis:    Component Value Date/Time   COLORURINE YELLOW 09/17/2015 0108   APPEARANCEUR CLEAR 09/17/2015 0108   LABSPEC 1.013 09/17/2015 0108   PHURINE 8.0 09/17/2015 0108   GLUCOSEU NEGATIVE 09/17/2015 0108   HGBUR SMALL (A) 09/17/2015 0108   BILIRUBINUR NEGATIVE 09/17/2015 0108   BILIRUBINUR neg 12/13/2014  1127   KETONESUR NEGATIVE 09/17/2015 0108   PROTEINUR >300 (A) 09/17/2015 0108   UROBILINOGEN 0.2 05/01/2015 1252   NITRITE NEGATIVE 09/17/2015 0108   LEUKOCYTESUR NEGATIVE 09/17/2015 0108    Sepsis Labs: @LABRCNTIP (procalcitonin:4,lacticidven:4) )No results found for this or any previous visit (from the past 240 hour(s)).   Radiological Exams on Admission: Dg Chest Portable 1 View  Result Date: 07/21/2016 CLINICAL DATA:  Cough, volume overload EXAM: PORTABLE CHEST 1 VIEW COMPARISON:  03/03/2016 FINDINGS: Cardiomegaly with vascular congestion. Left lower lobe atelectasis or infiltrate with left effusion. Suspect mild pulmonary edema. No effusion on the right. IMPRESSION: Cardiomegaly with vascular congestion. Suspect mild pulmonary edema. Probable small left pleural effusion with left lower lobe atelectasis or infiltrate. Electronically Signed   By: Rolm Baptise M.D.   On: 07/21/2016 10:05   Dg Toe Great Right  Result Date: 07/21/2016 CLINICAL DATA:  Right toe pain, injury last week. EXAM: RIGHT GREAT TOE COMPARISON:  None FINDINGS: Nondisplaced fracture through the distal phalanx of the right great toe. No additional acute bony abnormality. No subluxation or dislocation. Soft tissues are intact. IMPRESSION: Nondisplaced fracture through the distal phalanx of the right great toe. Electronically  Signed   By: Rolm Baptise M.D.   On: 07/21/2016 10:07    EKG: Independently reviewed.  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 She was on 4 L of oxygen on arrival.  Currently her VSS and afebrile, her Osats are at 99 in 2 L   sodium 137 potassium 5.4 BUN 44 creatinine 9.23 Anion Gap 19  T bili 1.1  WBC 4.4  Hb 8.1 and PLT 191  Glu 303  Hcult positive,, but has had + Hcult for over 1 year   Systolic function was mildly reduced.  EKG  45% to 50%   Diffuse hypokinesis.  grade 2 diastolic  dysfunction .  Review of Systems: As per HPI otherwise 10 point review of systems negative.      Lower GI bleed in the setting of  Iron deficiency anemia and Anemia of Chronic disease. H/o GIB over 1 year and she is scheduled to see a GI physician soon as OP  Hcult +  Hb  8  . She does not wish transfusion as she is Jehova's witness. SHe receives regularly Fe and Aranesp as OP as per Renal  Admit to telemetry obs   NPO until HD and monitor for symptoms, advance to Clear liquids  Hold IVF until after HD   Protonix 40mg  IV  GI consult if bleeding worsens     ESRD on HD MWF TThS, Cr 9.29  Primary Nephrologist Dr Mercy Moore .  Nephrology involved, Dr. Jonnie Finner notified and will proceed with HD today  Renal Diet when patient is able to tolerate po  Other plans as per Nephrology Check CMET in am   Chronic combined systolic and diastolic CHF  last 2D echo in June 6945 showed Systolic function was mildly reduced.  EKG  45% to 50%   Diffuse hypokinesis.  grade 2 DD .CXR Cardiomegaly with vascular congestion.  Check BNP . Also had some 99 in 2 L. Weight is 70.3 kg, in the setting of volume overload due to missing dialysis. -continue meds once completion of  dialysis takes place -monitor I/Os -daily weights -prn 02  Nausea, vomiting diarrhea, unlikely due to viral GE but to  to metabolic issues due to fluid overload due to missed HD. No recent hospitalizations or fevers  to suspect C diff  Treatment as above  Antimetics prn  GERD, Continue PPI   Hypertension BP 177/72   Pulse 91   Controlled Continue home anti-hypertensive medications after HD    Hyperlipidemia Continue home statins   Type II Diabetes  With Neuropathy Current blood sugar level is 303, missed  her meds for several days  Lab Results  Component Value Date   HGBA1C 9.4 12/13/2015  Hgb A1C SSI   History of Bipolar disorder  Continue her home meds   Right great toe wound, due to neuropathy and fracture noted per foot X ray. No osteomyelitis is suspected Wound Care consult      DVT prophylaxis:  SCD's    Code Status:   Full     Family Communication:  Discussed with patient Disposition Plan: Expect patient to be discharged to home after condition improves Consults called:   Renal  Admission status:Tele  Obs      Angad Nabers E, PA-C [facility]   07/21/2016, 12:55 PM

## 2016-07-21 NOTE — Procedures (Signed)
  I was present at this dialysis session, have reviewed the session itself and made  appropriate changes Kelly Splinter MD [place] pager [phone]   07/21/2016, 6:15 PM

## 2016-07-21 NOTE — ED Provider Notes (Addendum)
[facility] DEPT Provider Note   CSN: [id number] Arrival date & time: 07/21/16  3716     History   Chief Complaint Chief Complaint  Patient presents with  . Rectal Bleeding  . Shortness of Breath    HPI [name] is a 50 y.o. female.  HPI This 50 year old female on dialysis with last dialysis on Tuesday who presents today complaining of rectal bleeding. She states that she had 2 bloody stools on Tuesday while she was at dialysis. She also vomited 2 that day without any blood in it. She states that she continues to vomit and have bowel movement anytime she takes anything in by mouth. As the emesis as yellow [place] in nature. She states she has had food in her stool with some flecks of blood. She denies any fever, chills, or dyspnea. She states that she is now having some epigastric discomfort with vomiting. She denies any previous episodes of similar symptoms. Her primary care is at [facility] but she has not been seen by them yet. Past Medical History:  Diagnosis Date  . Anemia   . Anxiety   . Bipolar 1 disorder ([place])   . Bipolar disorder, unspecified 02/06/2014  . Chronic combined systolic and diastolic CHF (congestive heart failure) (HCC)    EF 40% by echo and 48% by stress test  . Chronic pain syndrome   . Depression   . ESRD on hemodialysis ([place])   . GERD (gastroesophageal reflux disease)   . Glaucoma   . Headache    sinus headaches  . Hepatitis C   . High cholesterol   . History of hiatal hernia   . History of vitamin D deficiency 06/20/2015  . Hypertension   . MRSA infection ?2014   "on the back of my head; spread throughout my bloodstream"  . Neuropathy ([place])   . Refusal of blood transfusions as patient is Jehovah's Witness   . Schizoaffective disorder, unspecified condition 02/08/2014  . Seizure [facility]) dx'd 2014   "don't know what kind; last one was ~ 04/2014"  . Stroke [facility])    TIA 2009  . TIA (transient ischemic attack) 2010  . Type II diabetes mellitus  ([place])    INSULIN DEPENDENT    Patient Active Problem List   Diagnosis Date Noted  . Pulmonary edema 03/03/2016  . Acute respiratory failure ([place]) 03/03/2016  . Respiratory failure with hypoxia ([place]) 03/03/2016  . Acute pulmonary edema ([place]) 01/14/2016  . Pneumonia 01/13/2016  . Hyperkalemia 12/25/2015  . Constipation 12/06/2015  . 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 Dyslipidemia associated with type 2 diabetes mellitus ([place]) 11/10/2015  . Depression   . Palliative care encounter   . Advanced directives, counseling/discussion   . GI bleeding 09/17/2015  . GIB (gastrointestinal bleeding) 09/17/2015  . Schizoaffective disorder ([place])   . Abnormal drug screen (06/19/2015) (Cocaine matabolites detected) 07/04/2015  . Chronic right elbow pain 06/20/2015  . Chronic left foot pain 06/20/2015  . History of TIA (transient ischemic attack) 06/20/2015  . History of stroke 06/20/2015  . History of vitamin D deficiency 06/20/2015  . Substance use disorder Risk: "Very High" 06/20/2015  . Long term prescription opiate use 06/20/2015  . Chronic pain 06/19/2015  . Chronic pain syndrome 06/19/2015  . Cocaine abuse 06/19/2015  . Left forearm pain   . ESRD (end stage renal disease) on dialysis ([place])   . Pancreatitis 05/10/2015  . Left arm pain  05/10/2015  . Diabetes mellitus with stage 5 chronic kidney disease ([place]) 05/10/2015  . Chronic renal failure, stage 5 (HCC)   . Dialysis patient ([place])   . Acute renal failure ([place]) 05/01/2015  . Metabolic acidosis 16/05/9603  . Diabetes mellitus with nephropathy ([place]) 05/01/2015  . RUQ abdominal pain 05/01/2015  . Hypertensive urgency 05/01/2015  . Anemia of chronic kidney failure 05/01/2015  . Transcondylar fracture of distal humerus 12/26/2014  . Impaired renal function 12/26/2014  . Closed fracture of part of upper end of humerus 12/14/2014  .  Nontraumatic amputation of left foot ([place]) 12/13/2014  . Unilateral visual loss 11/22/2014  . 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 ([place]) 11/22/2014  . Chronic combined systolic and diastolic CHF (congestive heart failure) ([place])   . Anemia, iron deficiency 04/13/2014  . Schizoaffective disorder, unspecified type ([place]) 02/08/2014  . Bipolar disorder ([place]) 02/06/2014  . Noncompliance 02/02/2014  . Hep C w/o coma, chronic ([place]) 05/18/2013  . CKD (chronic kidney disease) stage 5, GFR less than 15 ml/min (HCC) 05/18/2013  . Seizure disorder ([place]) 02/09/2013    Past Surgical History:  Procedure Laterality Date  . ABDOMINAL HYSTERECTOMY  2014  . ABDOMINAL HYSTERECTOMY  2010  . AMPUTATION Left 05/21/2013   Procedure: Left Midfoot AMPUTATION;  Surgeon: Newt Minion, MD;  Location: [facility];  Service: Orthopedics;  Laterality: Left;  Left Midfoot Amputation  . AV FISTULA PLACEMENT Left 05/04/2015   Procedure: ARTERIOVENOUS (AV) GRAFT INSERTION;  Surgeon: Angelia Mould, MD;  Location: [facility];  Service: Vascular;  Laterality: Left;  . COLONOSCOPY WITH PROPOFOL N/A 06/22/2013   Procedure: COLONOSCOPY WITH PROPOFOL;  Surgeon: Jeryl Columbia, MD;  Location: [facility] ENDOSCOPY;  Service: Endoscopy;  Laterality: N/A;  . ESOPHAGOGASTRODUODENOSCOPY N/A 05/11/2015   Procedure: ESOPHAGOGASTRODUODENOSCOPY (EGD);  Surgeon: Laurence Spates, MD;  Location: [facility] ENDOSCOPY;  Service: Endoscopy;  Laterality: N/A;  . EYE SURGERY Bilateral 2010   Lasik  . FOOT AMPUTATION Left    due diabetic neuropathy, could not feel ulcer on bottom of foot  . REFRACTIVE SURGERY Bilateral 2013  . TONSILLECTOMY  1970's  . TUBAL LIGATION    . TUBAL LIGATION  2000    OB History    Gravida Para Term Preterm AB Living   0 0 0 0 0     SAB TAB Ectopic Multiple Live Births   0 0 0           Home Medications    Prior to Admission medications   Medication Sig Start Date End Date Taking? Authorizing Provider  amLODipine  (NORVASC) 10 MG tablet Take 1 tablet (10 mg total) by mouth at bedtime. 01/16/16  Yes Orson Eva, MD  atorvastatin (LIPITOR) 40 MG tablet Take 1 tablet (40 mg total) by mouth at bedtime. 01/16/16  Yes Orson Eva, MD  B Complex-C-Folic Acid (RENAL VITAMIN PO) Take 1 tablet by mouth at bedtime.   Yes Historical Provider, MD  carvedilol (COREG) 3.125 MG tablet Take 1 tablet (3.125 mg total) by mouth 2 (two) times daily with a meal. Hold if HR <60 Patient taking differently: Take 25 mg by mouth 2 (two) times daily with a meal. Hold if HR <60 01/16/16  Yes Orson Eva, MD  cloNIDine (CATAPRES) 0.1 MG tablet Take 1 tablet (0.1 mg total) by mouth every 8 (eight) hours. 01/16/16  Yes Orson Eva, MD  diphenhydrAMINE (BENADRYL) 25 mg capsule Take 1 capsule (25 mg total) by mouth every 6 (six) hours as needed  for itching. 05/07/15  Yes Ripudeep Krystal Eaton, MD  insulin aspart (NOVOLOG) 100 UNIT/ML injection Inject 0-12 Units into the skin 3 (three) times daily before meals. Per sliding scale inject SQ <150=0units, 151-200=2 units, 201-250=4units, 251-300=6units,301-350=8 units, 351-400=10 units, >/= 401=12 units Patient taking differently: Inject 2-12 Units into the skin 3 (three) times daily before meals. Inject 2 units subcutaneously daily before meals plus sliding scale adjustment based on CBG 01/16/16  Yes David Tat, MD  insulin glargine (LANTUS) 100 UNIT/ML injection Inject 0.25 mLs (25 Units total) into the skin at bedtime. 03/05/16  Yes Barton Dubois, MD  lidocaine-prilocaine (EMLA) cream Apply 1 application topically See admin instructions. Apply to dialysis site one hour prior to dialysis   Yes Historical Provider, MD  LORazepam (ATIVAN) 0.5 MG tablet Take 0.5 mg by mouth See admin instructions. Take 1 tablet (0.5 mg) by mouth prior to dialysis and at bedtime as needed for sleep   Yes Historical Provider, MD  polyvinyl alcohol (LIQUIFILM TEARS) 1.4 % ophthalmic solution Place 2 drops into both eyes every 6 (six) hours as  needed for dry eyes.    Yes Historical Provider, MD  sevelamer carbonate (RENVELA) 800 MG tablet Take 3 tablets (2,400 mg total) by mouth 3 (three) times daily with meals. 01/16/16  Yes Orson Eva, MD  acetaminophen (TYLENOL) 325 MG tablet Take 2 tablets (650 mg total) by mouth every 6 (six) hours as needed for mild pain (or Fever >/= 101). 09/20/15   Delfina Redwood, MD  polyethylene glycol (MIRALAX / GLYCOLAX) packet Take 17 g by mouth daily as needed (constipation). Mix in 8 oz liquid and drink    Historical Provider, MD    Family History Family History  Problem Relation Age of Onset  . Hypertension Mother   . Hypertension Father     Social History Social History  Substance Use Topics  . Smoking status: Former Smoker    Packs/day: 1.00    Years: 20.00    Types: Cigarettes    Quit date: 03/31/2015  . Smokeless tobacco: Never Used     Comment: "stopped smoking in ~ 2013; restarted last week; stopped again 06/17/2014"  . Alcohol use 1.8 oz/week    3 Standard drinks or equivalent per week     Comment: 05/01/15 patient denies alcohol use.     Allergies   Penicillins; Gabapentin; Lyrica [pregabalin]; Saphris [asenapine]; Tramadol; and Other   Review of Systems Review of Systems  All other systems reviewed and are negative.    Physical Exam Updated Vital Signs BP 196/98   Pulse 91   Temp 98.6 F (37 C) (Oral)   Resp 22   LMP  (LMP Unknown)   SpO2 98%   Physical Exam  Constitutional: She is oriented to person, place, and time. She appears well-developed. No distress.  HENT:  Head: Normocephalic and atraumatic.  Eyes: Pupils are equal, round, and reactive to light.  Neck: Normal range of motion. Neck supple.  Cardiovascular: Normal rate.   Pulmonary/Chest: Effort normal.  Abdominal: Soft. Bowel sounds are normal.  Genitourinary:  Genitourinary Comments: Rectal done but no stool noted in rectal vault  Musculoskeletal: Normal range of motion.  Partial amputation  left foot Right foot with wound distal great toe, full thickness Dialysis access left upper extremity  Neurological: She is alert and oriented to person, place, and time. No cranial nerve deficit.  Skin: Skin is warm. Capillary refill takes less than 2 seconds.  Psychiatric: She has a normal mood  and affect.  Vitals reviewed.    ED Treatments / Results  Labs (all labs ordered are listed, but only abnormal results are displayed) Labs Reviewed  COMPREHENSIVE METABOLIC PANEL - Abnormal; Notable for the following:       Result Value   Potassium 5.4 (*)    Chloride 85 (*)    CO2 33 (*)    Glucose, Bld 303 (*)    BUN 44 (*)    Creatinine, Ser 9.23 (*)    Calcium 8.5 (*)    Albumin 3.4 (*)    GFR calc non Af Amer 4 (*)    GFR calc Af Amer 5 (*)    Anion gap 19 (*)    All other components within normal limits  CBC - Abnormal; Notable for the following:    RBC 2.75 (*)    Hemoglobin 8.1 (*)    HCT 27.0 (*)    RDW 16.3 (*)    All other components within normal limits  PROTIME-INR - Abnormal; Notable for the following:    Prothrombin Time 16.8 (*)    All other components within normal limits  POC OCCULT BLOOD, ED    EKG  EKG Interpretation None       Radiology Dg Chest Portable 1 View  Result Date: 07/21/2016 CLINICAL DATA:  Cough, volume overload EXAM: PORTABLE CHEST 1 VIEW COMPARISON:  03/03/2016 FINDINGS: Cardiomegaly with vascular congestion. Left lower lobe atelectasis or infiltrate with left effusion. Suspect mild pulmonary edema. No effusion on the right. IMPRESSION: Cardiomegaly with vascular congestion. Suspect mild pulmonary edema. Probable small left pleural effusion with left lower lobe atelectasis or infiltrate. Electronically Signed   By: Rolm Baptise M.D.   On: 07/21/2016 10:05   Dg Toe Great Right  Result Date: 07/21/2016 CLINICAL DATA:  Right toe pain, injury last week. EXAM: RIGHT GREAT TOE COMPARISON:  None FINDINGS: Nondisplaced fracture through the  distal phalanx of the right great toe. No additional acute bony abnormality. No subluxation or dislocation. Soft tissues are intact. IMPRESSION: Nondisplaced fracture through the distal phalanx of the right great toe. Electronically Signed   By: Rolm Baptise M.D.   On: 07/21/2016 10:07    Procedures Procedures (including critical care time)  Medications Ordered in ED Medications  albuterol (PROVENTIL) (2.5 MG/3ML) 0.083% nebulizer solution 5 mg (5 mg Nebulization Given 07/21/16 1032)     Initial Impression / Assessment and Plan / ED Course  I have reviewed the triage vital signs and the nursing notes.  Pertinent labs & imaging results that were available during my care of the patient were reviewed by me and considered in my medical decision making (see chart for details).  Clinical Course   1- volume overload- patient with increased dyspnea here with some relief on oxygen but continues tachypneic, she has missed dialysis on Thursday and Saturday 2- hyperkalemia- ekg pending, patient will need dialysis for #1 3- vomiting and diarrhea- ? Rectal bleeding, no active vomiitng or diarrhea here.  Will test stool for blood if she stools 4- anemia- hemoglobin 8.1- but no evidence of hypotension, patient with hisotry of anemia on dialysis but also reporting rectal bleeding. Per records patient is Jehovah's witness and will not accept blood transfusion 5- right great toe wound and fracture- will require careful observation, no s/s infection currently.  Plan dressing, non weight bearing.   Discussed with hospitalist and nephrology.  Hospitalist will admit and Dr. Jonnie Finner to dialyze.  Final Clinical Impressions(s) / ED Diagnoses   Final  diagnoses:  Nausea vomiting and diarrhea  Other hypervolemia  Dialysis patient, noncompliant [facility])    New Prescriptions New Prescriptions   No medications on file     Pattricia Boss, MD 07/21/16 1155    Pattricia Boss, MD 07/21/16 1157

## 2016-07-21 NOTE — ED Notes (Signed)
Pt. Wishes to not have son in her room. Charge RN and [employer] made aware. Pt. Son upset saying he does not trust hospitals or the government. [employer] with him.

## 2016-07-21 NOTE — Progress Notes (Signed)
Received report and patient from the ER. New Admission Note:    Arrival Method: Stretcher Mental Orientation: A&O  Assessment:  Skin: IV: Pain: Safety Measures: Admission: Not complete 6E Orientation: Family:  None at bedside  Orders have been reviewed and implemented.  Will continue to monitor the patient.

## 2016-07-21 NOTE — ED Triage Notes (Signed)
Pt. Coming from home via [facility] for rectal bleeding since Tuesday. Pt. At dialysis when she noted bright red clots in her stool. GI appt. Next week. Pt. Started vomiting 2 days ago. Pt. Has not taken any medications since Tuesday or gone to dialysis because she said she is sick and doesn't need her medicines or fluid taken off. Pt. C/o SOB, HA, and CP. EMS reports 12 lead unremarkable and lung sounds clear. Pt. Oxygen Saturation upper 80's on room air. Pt. BP 220/110 and CBG 413. Pt. Placed on 4L oxygen via [place] en route.

## 2016-07-22 DIAGNOSIS — Z992 Dependence on renal dialysis: Secondary | ICD-10-CM | POA: Diagnosis not present

## 2016-07-22 DIAGNOSIS — S92401A Displaced unspecified fracture of right great toe, initial encounter for closed fracture: Secondary | ICD-10-CM | POA: Diagnosis present

## 2016-07-22 DIAGNOSIS — X58XXXA Exposure to other specified factors, initial encounter: Secondary | ICD-10-CM | POA: Diagnosis present

## 2016-07-22 DIAGNOSIS — I5042 Chronic combined systolic (congestive) and diastolic (congestive) heart failure: Secondary | ICD-10-CM | POA: Diagnosis present

## 2016-07-22 DIAGNOSIS — J9691 Respiratory failure, unspecified with hypoxia: Secondary | ICD-10-CM | POA: Diagnosis not present

## 2016-07-22 DIAGNOSIS — E1122 Type 2 diabetes mellitus with diabetic chronic kidney disease: Secondary | ICD-10-CM | POA: Diagnosis present

## 2016-07-22 DIAGNOSIS — S91101A Unspecified open wound of right great toe without damage to nail, initial encounter: Secondary | ICD-10-CM | POA: Diagnosis present

## 2016-07-22 DIAGNOSIS — F3174 Bipolar disorder, in full remission, most recent episode manic: Secondary | ICD-10-CM

## 2016-07-22 DIAGNOSIS — K625 Hemorrhage of anus and rectum: Secondary | ICD-10-CM | POA: Diagnosis not present

## 2016-07-22 DIAGNOSIS — K921 Melena: Secondary | ICD-10-CM | POA: Diagnosis present

## 2016-07-22 DIAGNOSIS — N186 End stage renal disease: Secondary | ICD-10-CM | POA: Diagnosis not present

## 2016-07-22 DIAGNOSIS — E785 Hyperlipidemia, unspecified: Secondary | ICD-10-CM | POA: Diagnosis present

## 2016-07-22 DIAGNOSIS — N2581 Secondary hyperparathyroidism of renal origin: Secondary | ICD-10-CM | POA: Diagnosis present

## 2016-07-22 DIAGNOSIS — E1165 Type 2 diabetes mellitus with hyperglycemia: Secondary | ICD-10-CM | POA: Diagnosis present

## 2016-07-22 DIAGNOSIS — F259 Schizoaffective disorder, unspecified: Secondary | ICD-10-CM | POA: Diagnosis present

## 2016-07-22 DIAGNOSIS — K922 Gastrointestinal hemorrhage, unspecified: Secondary | ICD-10-CM

## 2016-07-22 DIAGNOSIS — F319 Bipolar disorder, unspecified: Secondary | ICD-10-CM | POA: Diagnosis present

## 2016-07-22 DIAGNOSIS — F1410 Cocaine abuse, uncomplicated: Secondary | ICD-10-CM | POA: Diagnosis present

## 2016-07-22 DIAGNOSIS — K219 Gastro-esophageal reflux disease without esophagitis: Secondary | ICD-10-CM | POA: Diagnosis present

## 2016-07-22 DIAGNOSIS — I132 Hypertensive heart and chronic kidney disease with heart failure and with stage 5 chronic kidney disease, or end stage renal disease: Secondary | ICD-10-CM | POA: Diagnosis present

## 2016-07-22 DIAGNOSIS — D509 Iron deficiency anemia, unspecified: Secondary | ICD-10-CM | POA: Diagnosis present

## 2016-07-22 DIAGNOSIS — E1140 Type 2 diabetes mellitus with diabetic neuropathy, unspecified: Secondary | ICD-10-CM | POA: Diagnosis present

## 2016-07-22 DIAGNOSIS — E875 Hyperkalemia: Secondary | ICD-10-CM | POA: Diagnosis present

## 2016-07-22 DIAGNOSIS — D631 Anemia in chronic kidney disease: Secondary | ICD-10-CM | POA: Diagnosis present

## 2016-07-22 DIAGNOSIS — D638 Anemia in other chronic diseases classified elsewhere: Secondary | ICD-10-CM | POA: Diagnosis present

## 2016-07-22 DIAGNOSIS — K5909 Other constipation: Secondary | ICD-10-CM | POA: Diagnosis present

## 2016-07-22 DIAGNOSIS — B182 Chronic viral hepatitis C: Secondary | ICD-10-CM | POA: Diagnosis present

## 2016-07-22 DIAGNOSIS — S92424A Nondisplaced fracture of distal phalanx of right great toe, initial encounter for closed fracture: Secondary | ICD-10-CM | POA: Diagnosis present

## 2016-07-22 LAB — COMPREHENSIVE METABOLIC PANEL
ALBUMIN: 3 g/dL — AB (ref 3.5–5.0)
ALT: 21 U/L (ref 14–54)
ANION GAP: 12 (ref 5–15)
AST: 29 U/L (ref 15–41)
Alkaline Phosphatase: 67 U/L (ref 38–126)
BUN: 23 mg/dL — ABNORMAL HIGH (ref 6–20)
CHLORIDE: 90 mmol/L — AB (ref 101–111)
CO2: 34 mmol/L — AB (ref 22–32)
Calcium: 8.4 mg/dL — ABNORMAL LOW (ref 8.9–10.3)
Creatinine, Ser: 6.27 mg/dL — ABNORMAL HIGH (ref 0.44–1.00)
GFR calc non Af Amer: 7 mL/min — ABNORMAL LOW (ref >60)
GFR, EST AFRICAN AMERICAN: 8 mL/min — AB (ref >60)
GLUCOSE: 124 mg/dL — AB (ref 65–99)
POTASSIUM: 4.8 mmol/L (ref 3.5–5.1)
SODIUM: 136 mmol/L (ref 135–145)
Total Bilirubin: 1 mg/dL (ref 0.3–1.2)
Total Protein: 6.4 g/dL — ABNORMAL LOW (ref 6.5–8.1)

## 2016-07-22 LAB — CBC
HEMATOCRIT: 22.5 % — AB (ref 36.0–46.0)
HEMOGLOBIN: 6.7 g/dL — AB (ref 12.0–15.0)
MCH: 29.3 pg (ref 26.0–34.0)
MCHC: 29.8 g/dL — AB (ref 30.0–36.0)
MCV: 98.3 fL (ref 78.0–100.0)
Platelets: 155 10*3/uL (ref 150–400)
RBC: 2.29 MIL/uL — ABNORMAL LOW (ref 3.87–5.11)
RDW: 16.3 % — ABNORMAL HIGH (ref 11.5–15.5)
WBC: 4 10*3/uL (ref 4.0–10.5)

## 2016-07-22 LAB — MRSA PCR SCREENING: MRSA BY PCR: NEGATIVE

## 2016-07-22 LAB — GLUCOSE, CAPILLARY
GLUCOSE-CAPILLARY: 146 mg/dL — AB (ref 65–99)
GLUCOSE-CAPILLARY: 115 mg/dL — AB (ref 65–99)
Glucose-Capillary: 125 mg/dL — ABNORMAL HIGH (ref 65–99)
Glucose-Capillary: 173 mg/dL — ABNORMAL HIGH (ref 65–99)

## 2016-07-22 LAB — HEMOGLOBIN A1C
HEMOGLOBIN A1C: 8.7 % — AB (ref 4.8–5.6)
Mean Plasma Glucose: 203 mg/dL

## 2016-07-22 MED ORDER — SODIUM CHLORIDE 0.9 % IV SOLN: 510.0000 mg | Freq: Once | INTRAVENOUS | Status: DC

## 2016-07-22 MED ORDER — SODIUM CHLORIDE 0.9 % IV SOLN: Freq: Once | INTRAVENOUS | Status: DC

## 2016-07-22 MED ORDER — LORAZEPAM 0.5 MG PO TABS
ORAL_TABLET | ORAL | Status: AC
  Filled 2016-07-22: qty 1

## 2016-07-22 MED ORDER — DIPHENHYDRAMINE HCL 25 MG PO CAPS
ORAL_CAPSULE | ORAL | Status: AC
  Filled 2016-07-22: qty 1

## 2016-07-22 MED ORDER — LORAZEPAM 0.5 MG PO TABS
0.5000 mg | ORAL_TABLET | Freq: Once | ORAL | Status: AC
  Administered 2016-07-22: 0.5 mg via ORAL

## 2016-07-22 MED ORDER — OXYCODONE HCL 5 MG PO TABS
ORAL_TABLET | ORAL | Status: AC
  Filled 2016-07-22: qty 2

## 2016-07-22 MED ORDER — DARBEPOETIN ALFA 200 MCG/0.4ML IJ SOSY
PREFILLED_SYRINGE | INTRAMUSCULAR | Status: AC
  Filled 2016-07-22: qty 0.4

## 2016-07-22 MED ORDER — NA FERRIC GLUC CPLX IN SUCROSE 12.5 MG/ML IV SOLN
250.0000 mg | INTRAVENOUS | Status: DC
  Administered 2016-07-22: 250 mg via INTRAVENOUS
  Filled 2016-07-22 (×3): qty 20

## 2016-07-22 MED ORDER — DARBEPOETIN ALFA 200 MCG/0.4ML IJ SOSY
200.0000 ug | PREFILLED_SYRINGE | INTRAMUSCULAR | Status: DC
  Administered 2016-07-22: 200 ug via INTRAVENOUS
  Filled 2016-07-22: qty 0.4

## 2016-07-22 MED ORDER — SILVER SULFADIAZINE 1 % EX CREA
TOPICAL_CREAM | Freq: Every day | CUTANEOUS | Status: DC
  Administered 2016-07-22 – 2016-07-26 (×5): via TOPICAL
  Filled 2016-07-22 (×2): qty 85

## 2016-07-22 NOTE — Progress Notes (Signed)
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 Will have patient sign form when she returns from HD. MD is aware

## 2016-07-22 NOTE — Consult Note (Signed)
[facility] Consult: 12:03 PM 07/22/2016  LOS: 0 days    Referring Provider: Dr Broadus John of [facility]  Primary Care Physician:  [name], MD Primary Gastroenterologist:  Previously [facility], dismissed by the practice.         Reason for Consultation:  FOBT + anemia.     HPI: [name] is a 50 y.o. female. She is a Restaurant manager, fast food. Patient with end-stage renal disease, HD on MWF.  IDDM. Anemia. Bipolar and schizoaffective disorder.  TIA. 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 Peripheral neuropathy.  Chronic polyarticular and all over body muscular pain managed with stable doses of oxycodone.  Diastolic and systolic heart failure. R supracondylar elbow fracture with non-union.  Hx Hepatitis C.  Hx blood loss anemia in 09/2015, at that time hgb 4.8 and she did allow transfusion of PRBC x 3.    06/2013 colonoscopy, Dr. Watt Climes. For hematochezia.  Poor prep limited visualization, so lesions could have been missed however no fresh or old blood visualized. Found small internal and external hemorrhoids with a small associated, but non-bleeding, tear, likely source of the reported red blood. 05/11/15 EGD, Dr. Laurence Spates.  For heme positive stool and right upper quadrant pain.  Study unremarkable. Pain attributed to gallstones.  Patient has chronic constipation. In the past she had prescription for MiraLAX and when she uses this daily she was having regular uneventful bowel movements. However she ran out of this about 2 months ago and did not realize that, though she does not have a primary care M.D., that her renal doctors could prescribe MiraLAX. Thus she developed constipation and is lucky if she moves her bowels every 3 or 4 days. On 12/11 she passed a very large bowel movement this was not bloody, it was formed and  brown. The following day, 12/12, she passed a couple of large clots of blood into her clothing.. After that she began having non-radiating, mid abdominal and mid pelvic pain along with 3-4 episodes of watery diarrhea mixed with small to moderate amounts of blood daily.   She felt the volume of diarrhea was large enough that she did not need to go to dialysis because she was already losing so much fluid. However on about 12/15 she developed nausea or vomiting and was unable to keep down any by mouth. Within 15 minutes of eating or drinking, she vomited gastric contents of the undigested food. She was not vomiting any coffee ground or bloody material.  EMS brought her to the emergency room yesterday.  She underwent emergent hemodialysis with removal of 3.5 L to address pulmonary edema and is slated for repeat hemodialysis today. Hemoglobin over the past several months has ranged anywhere from 9.1-11.7. It was 11 on 03/27/2016. It was 8.1 yesterday, 6.7 this morning.  MCV 98.  Coags normal.  Renal plan to give  parenteral iron and Aranesp today with HD, she receives parenteral iron and some form of Neupogen at dialysis regularly. She will not take blood transfusion due to her religious believes.  Patient doesn't use NSAIDs.  She doesn't drink alcohol. She denies dysphagia, heartburn. Up until last week she hadn't seen any bleeding per rectum.  In the past she has cut short her dialysis sessions due to abdominal pain. She generally doesn't have issues with nausea or vomiting and no recent excessive bleeding or vigorous bruising. No epistaxis.   There is no family history of colon cancer, GI bleeding or ulcer disease. She does not know of any liver disease. She has many close relatives who are dialysis patients.     Past Medical History:  Diagnosis Date  . Anemia   . Anxiety   . Bipolar 1 disorder ([place])   . Bipolar disorder, unspecified 02/06/2014  . Chronic combined systolic and diastolic CHF (congestive heart  failure) (H[facility])    EF 40% by echo and 48% by stress test  . Chronic pain syndrome   . Depression   . ESRD on hemodialysis ([place])   . GERD (gastroesophageal reflux disease)   . Glaucoma   . Headache    sinus headaches  . Hepatitis C   . High cholesterol   . History of hiatal hernia   . History of vitamin D deficiency 06/20/2015  . Hypertension   . MRSA infection ?2014   "on the back of my head; spread throughout my bloodstream"  . Neuropathy ([place])   . Refusal of blood transfusions as patient is Jehovah's Witness   . Schizoaffective disorder, unspecified condition 02/08/2014  . Seizure [facility]) dx'd 2014   "don't know what kind; last one was ~ 04/2014"  . Stroke [facility])    TIA 2009  . TIA (transient ischemic attack) 2010  . Type II diabetes mellitus ([place])    INSULIN DEPENDENT    Past Surgical History:  Procedure Laterality Date  . ABDOMINAL HYSTERECTOMY  2014  . ABDOMINAL HYSTERECTOMY  2010  . AMPUTATION Left 05/21/2013   Procedure: Left Midfoot AMPUTATION;  Surgeon: Newt Minion, MD;  Location: [facility];  Service: Orthopedics;  Laterality: Left;  Left Midfoot Amputation  . AV FISTULA PLACEMENT Left 05/04/2015   Procedure: ARTERIOVENOUS (AV) GRAFT INSERTION;  Surgeon: Angelia Mould, MD;  Location: [place];  Service: Vascular;  Laterality: Left;  . COLONOSCOPY WITH PROPOFOL N/A 06/22/2013   Procedure: COLONOSCOPY WITH PROPOFOL;  Surgeon: Jeryl Columbia, MD;  Location: [facility] ENDOSCOPY;  Service: Endoscopy;  Laterality: N/A;  . ESOPHAGOGASTRODUODENOSCOPY N/A 05/11/2015   Procedure: ESOPHAGOGASTRODUODENOSCOPY (EGD);  Surgeon: Laurence Spates, MD;  Location: [facility] ENDOSCOPY;  Service: Endoscopy;  Laterality: N/A;  . EYE SURGERY Bilateral 2010   Lasik  . FOOT AMPUTATION Left    due diabetic neuropathy, could not feel ulcer on bottom of foot  . REFRACTIVE SURGERY Bilateral 2013  . TONSILLECTOMY  1970's  . TUBAL LIGATION    . TUBAL LIGATION  2000    Prior to Admission medications   Medication  Sig Start Date End Date Taking? Authorizing Provider  amLODipine (NORVASC) 10 MG tablet Take 1 tablet (10 mg total) by mouth at bedtime. 01/16/16  Yes Orson Eva, MD  atorvastatin (LIPITOR) 40 MG tablet Take 1 tablet (40 mg total) by mouth at bedtime. 01/16/16  Yes Orson Eva, MD  B Complex-C-Folic Acid (RENAL VITAMIN PO) Take 1 tablet by mouth at bedtime.   Yes Historical Provider, MD  carvedilol (COREG) 3.125 MG tablet Take 1 tablet (3.125 mg total) by mouth 2 (two) times daily with a meal. Hold if HR <60 Patient taking differently: Take 25 mg by mouth 2 (two) times daily  with a meal. Hold if HR <60 01/16/16  Yes Orson Eva, MD  cloNIDine (CATAPRES) 0.1 MG tablet Take 1 tablet (0.1 mg total) by mouth every 8 (eight) hours. 01/16/16  Yes Orson Eva, MD  diphenhydrAMINE (BENADRYL) 25 mg capsule Take 1 capsule (25 mg total) by mouth every 6 (six) hours as needed for itching. 05/07/15  Yes Ripudeep Krystal Eaton, MD  insulin aspart (NOVOLOG) 100 UNIT/ML injection Inject 0-12 Units into the skin 3 (three) times daily before meals. Per sliding scale inject SQ <150=0units, 151-200=2 units, 201-250=4units, 251-300=6units,301-350=8 units, 351-400=10 units, >/= 401=12 units Patient taking differently: Inject 2-12 Units into the skin 3 (three) times daily before meals. Inject 2 units subcutaneously daily before meals plus sliding scale adjustment based on CBG 01/16/16  Yes David Tat, MD  insulin glargine (LANTUS) 100 UNIT/ML injection Inject 0.25 mLs (25 Units total) into the skin at bedtime. 03/05/16  Yes Barton Dubois, MD  lidocaine-prilocaine (EMLA) cream Apply 1 application topically See admin instructions. Apply to dialysis site one hour prior to dialysis   Yes Historical Provider, MD  LORazepam (ATIVAN) 0.5 MG tablet Take 0.5 mg by mouth See admin instructions. Take 1 tablet (0.5 mg) by mouth prior to dialysis and at bedtime as needed for sleep   Yes Historical Provider, MD  polyvinyl alcohol (LIQUIFILM TEARS) 1.4 %  ophthalmic solution Place 2 drops into both eyes every 6 (six) hours as needed for dry eyes.    Yes Historical Provider, MD  sevelamer carbonate (RENVELA) 800 MG tablet Take 3 tablets (2,400 mg total) by mouth 3 (three) times daily with meals. 01/16/16  Yes Orson Eva, MD  acetaminophen (TYLENOL) 325 MG tablet Take 2 tablets (650 mg total) by mouth every 6 (six) hours as needed for mild pain (or Fever >/= 101). 09/20/15   Delfina Redwood, MD  polyethylene glycol (MIRALAX / GLYCOLAX) packet Take 17 g by mouth daily as needed (constipation). Mix in 8 oz liquid and drink    Historical Provider, MD    Scheduled Meds: . sodium chloride   Intravenous Once  . amLODipine  10 mg Oral QHS  . atorvastatin  40 mg Oral QHS  . carvedilol  3.125 mg Oral BID WC  . cloNIDine  0.1 mg Oral Q8H  . darbepoetin (ARANESP) injection - DIALYSIS  200 mcg Intravenous Q Mon-HD  . ferric gluconate (FERRLECIT/NULECIT) IV  250 mg Intravenous Q M,W,F-HD  . insulin aspart  0-9 Units Subcutaneous TID WC  . insulin glargine  13 Units Subcutaneous QHS  . LORazepam  0.5 mg Oral Q T,Th,Sa-HD  . multivitamin   Oral QHS  . sevelamer carbonate  2,400 mg Oral TID WC  . sodium chloride flush  3 mL Intravenous Q12H   Infusions:  PRN Meds: acetaminophen, diphenhydrAMINE, LORazepam, oxyCODONE, polyvinyl alcohol, senna-docusate, traZODone   Allergies as of 07/21/2016 - Review Complete 07/21/2016  Allergen Reaction Noted  . Penicillins Hives and Other (See Comments) 09/30/2014  . Gabapentin Itching and Swelling 02/09/2013  . Lyrica [pregabalin] Swelling and Other (See Comments) 09/30/2014  . Saphris [asenapine] Swelling and Other (See Comments) 09/30/2014  . Tramadol Swelling and Other (See Comments) 09/30/2014  . Other Other (See Comments) 05/01/2015    Family History  Problem Relation Age of Onset  . Hypertension Mother   . Hypertension Father     Social History   Social History  . Marital status: Single    Spouse  name: N/A  . Number of children: N/A  .  Years of education: N/A   Occupational History  . Not on file.   Social History Main Topics  . Smoking status: Former Smoker    Packs/day: 1.00    Years: 20.00    Types: Cigarettes    Quit date: 03/31/2015  . Smokeless tobacco: Never Used     Comment: "stopped smoking in ~ 2013; restarted last week; stopped again 06/17/2014"  . Alcohol use 1.8 oz/week    3 Standard drinks or equivalent per week     Comment: 05/01/15 patient denies alcohol use.  . Drug use:     Types: "Crack" cocaine, Cocaine     Comment: last smoked crack 11-07-14  . Sexual activity: Not Currently    Birth control/ protection: Abstinence   Other Topics Concern  . Not on file   Social History Narrative   ** Merged History Encounter **        REVIEW OF SYSTEMS: Constitutional:  Fatigue and weakness in recent days. ENT:  No nose bleeds Pulm:  Shortness of breath, improved following dialysis yesterday. CV:  No palpitations, no LE edema. No chest pain GU: No hematuria. GI:  Per HPI Heme:  Per HPI.   Transfusions:  In past, but makes her feel bad that she did this.  Neuro:  No headaches, no peripheral tingling or numbness Derm:  Chronic allover her body pruritus. Intermittent pustules on upper body.   Endocrine:  No sweats or chills.  No polyuria or dysuria Immunization:  Did not inquire as to recent or prior vaccinations. Travel:  None beyond local counties in last few months.    PHYSICAL EXAM: Vital signs in last 24 hours: Vitals:   07/22/16 0700 07/22/16 0900  BP: (!) 152/79 (!) 156/74  Pulse: 80 74  Resp: 16   Temp: 97.5 F (36.4 C)    Wt Readings from Last 3 Encounters:  07/21/16 75.7 kg (166 lb 14.4 oz)  03/05/16 70.3 kg (154 lb 15.7 oz)  01/17/16 71 kg (156 lb 8.4 oz)    General: Pleasant, chronically ill appearing, alert, comfortable AAF. Head:  No facial asymmetry or swelling.  Eyes:  No scleral icterus, no conjunctival pallor. EOMI. Ears:  Not  HOH.  Nose:  No discharge or congestion Mouth:  Oral mucosa is moist and clear. Tongue is midline. Neck:  No masses, no JVD, no TMJ. Lungs:  Greatly diminished breath sounds on the left base, rales on the right base. Breath sounds become normal about halfway up in both lung fields. Heart: RRR. No MRG. S1, S2 present. Abdomen:  Soft. Minimally distended. No masses, no HSM. Minimal tenderness which is not focal. No masses. Bowel sounds normal but somewhat hypoactive.  Rectal: Deferred the rectal exam because she is in the hemodialysis unit.   Musc/Skeltl: No joint redness, swelling or gross deformities other than mid foot amputation on the left. Extremities:  No CCE.  Post fracture deformity of right elbow, can not fully straighten her arm.  Neurologic:  Patient is alert. She is oriented 3. Good historian. No tremor, no limb weakness. Skin: Punctate lesions of the upper extremities. Signs of superficial scratching on her back and arms. She has rigged up to Combs and and in order to scratch her back. Tattoos:  None observed Nodes:  No cervical adenopathy  Psych:  Calm, pleasant, cooperative.   LAB RESULTS:  Recent Labs  07/21/16 0915 07/22/16 0614  WBC 4.4 4.0  HGB 8.1* 6.7*  HCT 27.0* 22.5*  PLT 191 155  BMET Lab Results  Component Value Date   NA 136 07/22/2016   NA 137 07/21/2016   NA 133 (L) 03/27/2016   K 4.8 07/22/2016   K 5.4 (H) 07/21/2016   K 5.5 (H) 03/27/2016   CL 90 (L) 07/22/2016   CL 85 (L) 07/21/2016   CL 94 (L) 03/27/2016   CO2 34 (H) 07/22/2016   CO2 33 (H) 07/21/2016   CO2 25 03/27/2016   GLUCOSE 124 (H) 07/22/2016   GLUCOSE 303 (H) 07/21/2016   GLUCOSE 259 (H) 03/27/2016   BUN 23 (H) 07/22/2016   BUN 44 (H) 07/21/2016   BUN 87 (H) 03/27/2016   CREATININE 6.27 (H) 07/22/2016   CREATININE 9.23 (H) 07/21/2016   CREATININE 9.89 (H) 03/27/2016   CALCIUM 8.4 (L) 07/22/2016   CALCIUM 8.5 (L) 07/21/2016   CALCIUM 9.1 03/27/2016   LFT  Recent Labs   07/21/16 0915 07/22/16 0614  PROT 7.4 6.4*  ALBUMIN 3.4* 3.0*  AST 35 29  ALT 22 21  ALKPHOS 83 67  BILITOT 1.1 1.0   PT/INR Lab Results  Component Value Date   INR 1.35 07/21/2016   INR 1.4 (A) 12/23/2015   INR 1.2 (A) 12/23/2015   PROTIME 17.3 (A) 12/23/2015   PROTIME 15.9 (A) 12/23/2015   Hepatitis Panel No results for input(s): HEPBSAG, HCVAB, HEPAIGM, HEPBIGM in the last 72 hours. C-Diff No components found for: CDIFF Lipase     Component Value Date/Time   LIPASE 54 (H) 11/22/2015 0725    Drugs of Abuse     Component Value Date/Time   LABOPIA NONE DETECTED 11/22/2015 0720   COCAINSCRNUR NONE DETECTED 11/22/2015 0720   LABBENZ NONE DETECTED 11/22/2015 0720   AMPHETMU NONE DETECTED 11/22/2015 0720   THCU NONE DETECTED 11/22/2015 0720   LABBARB NONE DETECTED 11/22/2015 0720     RADIOLOGY STUDIES: X-ray Chest Pa And Lateral  Result Date: 07/21/2016 CLINICAL DATA:  Pt reports cough x 3 days and she missed her dialysis treatment; h/o DM, HTN, and TIA; former smoker EXAM: CHEST  2 VIEW COMPARISON:  07/21/2016 FINDINGS: Heart size is enlarged. There are diffuse bilateral airspace filling opacities with a perihilar distribution. Opacification at the left lung base obscures the left hemidiaphragm. There are bilateral pleural effusions. IMPRESSION: 1. Findings consistent with CHF. 2. Consolidation at the left lung base is suspicious for infectious process. Electronically Signed   By: Nolon Nations M.D.   On: 07/21/2016 13:39   Dg Chest Portable 1 View  Result Date: 07/21/2016 CLINICAL DATA:  Cough, volume overload EXAM: PORTABLE CHEST 1 VIEW COMPARISON:  03/03/2016 FINDINGS: Cardiomegaly with vascular congestion. Left lower lobe atelectasis or infiltrate with left effusion. Suspect mild pulmonary edema. No effusion on the right. IMPRESSION: Cardiomegaly with vascular congestion. Suspect mild pulmonary edema. Probable small left pleural effusion with left lower lobe  atelectasis or infiltrate. Electronically Signed   By: Rolm Baptise M.D.   On: 07/21/2016 10:05   Dg Toe Great Right  Result Date: 07/21/2016 CLINICAL DATA:  Right toe pain, injury last week. EXAM: RIGHT GREAT TOE COMPARISON:  None FINDINGS: Nondisplaced fracture through the distal phalanx of the right great toe. No additional acute bony abnormality. No subluxation or dislocation. Soft tissues are intact. IMPRESSION: Nondisplaced fracture through the distal phalanx of the right great toe. Electronically Signed   By: Rolm Baptise M.D.   On: 07/21/2016 10:07     IMPRESSION:   *  Hematochezia and diarrhea in pt with previous constipation.  ?  Hemorrhoidal vs diverticular bleeding.  Given the presence of abdominal pain, ischemic colitis is also possible.   *  ESRD, on dialysis since at least fall 2016. .   *  IDDM, poor control.  A1c 8.7   *  Hepatitis C.  Genotype 1A.  Quantitative viral count 3,510,000 in 05/2015 Normal appearance to the liver on latest CT scan of 05/2015.  She received hepatitis C treatment with "a shot and a pill" (? Ribaviran/Interferon ?) when she was living in [place] several years ago. 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   On 07/2015 abdominal ultrasound liver texture described as "somewhat coarse"  *  Chronic opiates for chronic musc-skeletal pain.      PLAN:     *  Per Dr Ardis Hughs.     Azucena Freed  07/22/2016, 12:03 PM Pager: [phone]  ________________________________________________________________________  [facility] MD note:  I personally examined the patient, reviewed the data and agree with the assessment and plan described above.  She started having overt red rectal bleeding following a single very large tough to move stool.  She's still in dialysis, so not comfortable doing a rectal exam just yet but I think it is very unlikely that the bleeding she saw is from anything serious, neoplastic. She has had NO BM in  about 36 hours and no bleeding either.  She knows her Hb is dangerously low but she will not accept blood transfusion. She said her conscience really bothered her after taking blood transfusion for a bleeding episode 1-2 years ago and she will never do that again.  I recommend she be observed clinically for now. NO plans for invasive procedures unless significant overt rebleeding.   Owens Loffler, MD [facility] Pager [phone]

## 2016-07-22 NOTE — Progress Notes (Signed)
PT Cancellation Note  Patient Details Name: [name] MRN: [id number] DOB: [birth year]   Cancelled Treatment:    Reason Eval/Treat Not Completed: Patient declined, no reason specified.  Pt declining due to not enough energy and getting ready to go to HD soon. 07/22/2016  Donnella Sham, [place] [phone]  (pager)   Tessie Fass Rekita Miotke 07/22/2016, 12:33 PM

## 2016-07-22 NOTE — Progress Notes (Addendum)
PROGRESS NOTE    [name]  VVO:[id number] DOB: 16-Dec-1965 DOA: 07/21/2016 PCP: [name], MD  Brief Narrative: The patient is a 50 y.o. year-old with history of DM2, CVA, HTN, Jehovah's Witness, hepatitis C, combined CHF, bipolar d/o who presented to ED, 12/17 , she reports dark red blood per rectum Tuesday and smaller amounts there after.  She also had some N/V for the last several days and due to this missed HD x 2.  Here with SOB.  CXR with pulm edema and LLL opacity.  Hb down to 8.1 heme positive  Assessment & Plan:   Lower GI bleed in the setting of  Iron deficiency anemia and Anemia of Chronic disease.  -hemoccult positive and Hb down to 6.7 this am, reports dark red blood per rectum on Tuesday -refuses blood transfusion-Jehovah's witness, explained risks -continue maximal IV Iron and Epo with HD -GI consulted, colonoscopy 2014 - with hemorrhoids only and EGD -2016 was normal  -possibly would be hemorrhoidal bleeding alone, although history not suggestive of such  Anemia -due to CKD, and small component of blood loss and hemodilution from volume overload -monitor Hb, declines blood -EPo and Iron with HD  ESRD on HD MWF TThS -missed HD x2 -extra HD per Renal  Chronic combined systolic and diastolic CHF -  last 2D echo in June 2694 showed Systolic function was mildly reduced.  45% to 50% Diffuse hypokinesis.  grade 2 DD -volume managed with HD  Nausea, vomiting diarrhea,  -could be  to viral gastroenteritis compounded by uremia and missed HD, monitor -no further N/V at this time  Type II Diabetes  With Neuropathy  -poor med compliance -last Hba1c 9.4  GERD, Continue PPI  Hypertension  -Continue home anti-hypertensive medications  History of Bipolar disorder  Continue her home meds   Right great toe wound, due to neuropathy and fracture noted per foot X ray. No osteomyelitis is suspected Wound Care consulted  DVT prophylaxis:  SCD's      Code Status:   Full     Family Communication:  Discussed with patient Disposition Plan: home when symptoms better    Consultants:   [facility]    Subjective: Feels weak  Objective: Vitals:   07/21/16 1928 07/21/16 2125 07/22/16 0700 07/22/16 0900  BP:  (!) 188/84 (!) 152/79 (!) 156/74  Pulse:  87 80 74  Resp:  16 16   Temp:  98 F (36.7 C) 97.5 F (36.4 C)   TempSrc:  Oral Oral   SpO2: 93% 100% 99% 98%  Weight:  75.7 kg (166 lb 14.4 oz)    Height:  5\' 4"  (1.626 m)      Intake/Output Summary (Last 24 hours) at 07/22/16 1035 Last data filed at 07/22/16 0900  Gross per 24 hour  Intake              720 ml  Output             3500 ml  Net            -2780 ml   Filed Weights   07/21/16 1540 07/21/16 1850 07/21/16 2125  Weight: 76.1 kg (167 lb 12.3 oz) 72.6 kg (160 lb 0.9 oz) 75.7 kg (166 lb 14.4 oz)    Examination:  General exam: Appears calm and comfortable, AAOx3, no distress Respiratory system: basilar crackles noted Cardiovascular system: S1 & S2 heard, RRR. No JVD, murmurs, rubs, gallops or clicks. No pedal edema. Gastrointestinal system: Abdomen is  nondistended, soft and nontender. No organomegaly or masses felt. Normal bowel sounds heard. Central nervous system: Alert and oriented. No focal neurological deficits. Extremities: 2plus edema Skin: No rashes, lesions or ulcers Psychiatry: Judgement and insight appear normal. Mood & affect appropriate.     Data Reviewed: I have personally reviewed following labs and imaging studies  CBC:  Recent Labs Lab 07/21/16 0915 07/22/16 0614  WBC 4.4 4.0  HGB 8.1* 6.7*  HCT 27.0* 22.5*  MCV 98.2 98.3  PLT 191 671   Basic Metabolic Panel:  Recent Labs Lab 07/21/16 0915 07/22/16 0614  NA 137 136  K 5.4* 4.8  CL 85* 90*  CO2 33* 34*  GLUCOSE 303* 124*  BUN 44* 23*  CREATININE 9.23* 6.27*  CALCIUM 8.5* 8.4*   GFR: Estimated Creatinine Clearance: 10.7 mL/min (by C-G formula based on SCr of 6.27 mg/dL  (H)). Liver Function Tests:  Recent Labs Lab 07/21/16 0915 07/22/16 0614  AST 35 29  ALT 22 21  ALKPHOS 83 67  BILITOT 1.1 1.0  PROT 7.4 6.4*  ALBUMIN 3.4* 3.0*   No results for input(s): LIPASE, AMYLASE in the last 168 hours. No results for input(s): AMMONIA in the last 168 hours. Coagulation Profile:  Recent Labs Lab 07/21/16 0915  INR 1.35   Cardiac Enzymes: No results for input(s): CKTOTAL, CKMB, CKMBINDEX, TROPONINI in the last 168 hours. BNP (last 3 results) No results for input(s): PROBNP in the last 8760 hours. HbA1C: No results for input(s): HGBA1C in the last 72 hours. CBG:  Recent Labs Lab 07/21/16 2107 07/22/16 0734  GLUCAP 206* 125*   Lipid Profile: No results for input(s): CHOL, HDL, LDLCALC, TRIG, CHOLHDL, LDLDIRECT in the last 72 hours. Thyroid Function Tests: No results for input(s): TSH, T4TOTAL, FREET4, T3FREE, THYROIDAB in the last 72 hours. Anemia Panel: No results for input(s): VITAMINB12, FOLATE, FERRITIN, TIBC, IRON, RETICCTPCT in the last 72 hours. Urine analysis:    Component Value Date/Time   COLORURINE YELLOW 09/17/2015 0108   APPEARANCEUR CLEAR 09/17/2015 0108   LABSPEC 1.013 09/17/2015 0108   PHURINE 8.0 09/17/2015 0108   GLUCOSEU NEGATIVE 09/17/2015 0108   HGBUR SMALL (A) 09/17/2015 0108   BILIRUBINUR NEGATIVE 09/17/2015 0108   BILIRUBINUR neg 12/13/2014 1127   KETONESUR NEGATIVE 09/17/2015 0108   PROTEINUR >300 (A) 09/17/2015 0108   UROBILINOGEN 0.2 05/01/2015 1252   NITRITE NEGATIVE 09/17/2015 0108   LEUKOCYTESUR NEGATIVE 09/17/2015 0108   Sepsis Labs: @LABRCNTIP (procalcitonin:4,lacticidven:4)  ) Recent Results (from the past 240 hour(s))  MRSA PCR Screening     Status: None   Collection Time: 07/22/16  6:24 AM  Result Value Ref Range Status   MRSA by PCR NEGATIVE NEGATIVE Final    Comment:        The GeneXpert MRSA Assay (FDA approved for NASAL specimens only), is one component of a comprehensive MRSA  colonization surveillance program. It is not intended to diagnose MRSA infection nor to guide or monitor treatment for MRSA infections.          Radiology Studies: X-ray Chest Pa And Lateral  Result Date: 07/21/2016 CLINICAL DATA:  Pt reports cough x 3 days and she missed her dialysis treatment; h/o DM, HTN, and TIA; former smoker EXAM: CHEST  2 VIEW COMPARISON:  07/21/2016 FINDINGS: Heart size is enlarged. There are diffuse bilateral airspace filling opacities with a perihilar distribution. Opacification at the left lung base obscures the left hemidiaphragm. There are bilateral pleural effusions. IMPRESSION: 1. Findings consistent with CHF. 2. Consolidation  at the left lung base is suspicious for infectious process. Electronically Signed   By: Nolon Nations M.D.   On: 07/21/2016 13:39   Dg Chest Portable 1 View  Result Date: 07/21/2016 CLINICAL DATA:  Cough, volume overload EXAM: PORTABLE CHEST 1 VIEW COMPARISON:  03/03/2016 FINDINGS: Cardiomegaly with vascular congestion. Left lower lobe atelectasis or infiltrate with left effusion. Suspect mild pulmonary edema. No effusion on the right. IMPRESSION: Cardiomegaly with vascular congestion. Suspect mild pulmonary edema. Probable small left pleural effusion with left lower lobe atelectasis or infiltrate. Electronically Signed   By: Rolm Baptise M.D.   On: 07/21/2016 10:05   Dg Toe Great Right  Result Date: 07/21/2016 CLINICAL DATA:  Right toe pain, injury last week. EXAM: RIGHT GREAT TOE COMPARISON:  None FINDINGS: Nondisplaced fracture through the distal phalanx of the right great toe. No additional acute bony abnormality. No subluxation or dislocation. Soft tissues are intact. IMPRESSION: Nondisplaced fracture through the distal phalanx of the right great toe. Electronically Signed   By: Rolm Baptise M.D.   On: 07/21/2016 10:07        Scheduled Meds: . sodium chloride   Intravenous Once  . amLODipine  10 mg Oral QHS  .  atorvastatin  40 mg Oral QHS  . carvedilol  3.125 mg Oral BID WC  . cloNIDine  0.1 mg Oral Q8H  . darbepoetin (ARANESP) injection - DIALYSIS  200 mcg Intravenous Q Mon-HD  . ferric gluconate (FERRLECIT/NULECIT) IV  250 mg Intravenous Q M,W,F-HD  . insulin aspart  0-9 Units Subcutaneous TID WC  . insulin glargine  13 Units Subcutaneous QHS  . LORazepam  0.5 mg Oral Q T,Th,Sa-HD  . multivitamin   Oral QHS  . sevelamer carbonate  2,400 mg Oral TID WC  . sodium chloride flush  3 mL Intravenous Q12H   Continuous Infusions:   LOS: 0 days    Time spent: 34min    Domenic Polite, MD [facility] Pager [phone]  If 7PM-7AM, please contact night-coverage [email or web address] Password [email or web address] 07/22/2016, 10:35 AM

## 2016-07-22 NOTE — Progress Notes (Signed)
[facility] Progress Note   Subjective:  Still dyspneic and tired today, O2 sats in 70's without oxygen. S/p 3.5L removed at HD yesterday, will need more dialysis today. Hgb very low (6.7) and + guaiacs. She is refusing blood d/t Jehovah's Witness status, discussed options. She has accepted blood in the past, but it "weighed on my soul." Will plan to give IV iron and Aranesp today, but this will be slow acting, she understands.  Objective Vitals:   07/21/16 1928 07/21/16 2125 07/22/16 0700 07/22/16 0900  BP:  (!) 188/84 (!) 152/79 (!) 156/74  Pulse:  87 80 74  Resp:  16 16   Temp:  98 F (36.7 C) 97.5 F (36.4 C)   TempSrc:  Oral Oral   SpO2: 93% 100% 99% 98%  Weight:  75.7 kg (166 lb 14.4 oz)    Height:  5\' 4"  (1.626 m)     Physical Exam General: Well appearing female, on nasal oxygen. NAD. Heart: RRR; no murmur. Lungs: Diminished breath sounds in B bases. No rales or wheezing. Abdomen: Soft, non-tender Extremities: 2+ LE edema Dialysis Access: L forearm AVG + thrill/bruit  Additional Objective Labs: Basic Metabolic Panel:  Recent Labs Lab 07/21/16 0915 07/22/16 0614  NA 137 136  K 5.4* 4.8  CL 85* 90*  CO2 33* 34*  GLUCOSE 303* 124*  BUN 44* 23*  CREATININE 9.23* 6.27*  CALCIUM 8.5* 8.4*   Liver Function Tests:  Recent Labs Lab 07/21/16 0915 07/22/16 0614  AST 35 29  ALT 22 21  ALKPHOS 83 67  BILITOT 1.1 1.0  PROT 7.4 6.4*  ALBUMIN 3.4* 3.0*   CBC:  Recent Labs Lab 07/21/16 0915 07/22/16 0614  WBC 4.4 4.0  HGB 8.1* 6.7*  HCT 27.0* 22.5*  MCV 98.2 98.3  PLT 191 155  CBG:  Recent Labs Lab 07/21/16 2107 07/22/16 0734  GLUCAP 206* 125*   Studies/Results: X-ray Chest Pa And Lateral  Result Date: 07/21/2016 CLINICAL DATA:  Pt reports cough x 3 days and she missed her dialysis treatment; h/o DM, HTN, and TIA; former smoker EXAM: CHEST  2 VIEW COMPARISON:  07/21/2016 FINDINGS: Heart size is enlarged. There are diffuse  bilateral airspace filling opacities with a perihilar distribution. Opacification at the left lung base obscures the left hemidiaphragm. There are bilateral pleural effusions. IMPRESSION: 1. Findings consistent with CHF. 2. Consolidation at the left lung base is suspicious for infectious process. Electronically Signed   By: Nolon Nations M.D.   On: 07/21/2016 13:39   Dg Chest Portable 1 View  Result Date: 07/21/2016 CLINICAL DATA:  Cough, volume overload EXAM: PORTABLE CHEST 1 VIEW COMPARISON:  03/03/2016 FINDINGS: Cardiomegaly with vascular congestion. Left lower lobe atelectasis or infiltrate with left effusion. Suspect mild pulmonary edema. No effusion on the right. IMPRESSION: Cardiomegaly with vascular congestion. Suspect mild pulmonary edema. Probable small left pleural effusion with left lower lobe atelectasis or infiltrate. Electronically Signed   By: Rolm Baptise M.D.   On: 07/21/2016 10:05   Dg Toe Great Right  Result Date: 07/21/2016 CLINICAL DATA:  Right toe pain, injury last week. EXAM: RIGHT GREAT TOE COMPARISON:  None FINDINGS: Nondisplaced fracture through the distal phalanx of the right great toe. No additional acute bony abnormality. No subluxation or dislocation. Soft tissues are intact. IMPRESSION: Nondisplaced fracture through the distal phalanx of the right great toe. Electronically Signed   By: Rolm Baptise M.D.   On: 07/21/2016 10:07   Medications:  . sodium chloride  Intravenous Once  . amLODipine  10 mg Oral QHS  . atorvastatin  40 mg Oral QHS  . carvedilol  3.125 mg Oral BID WC  . cloNIDine  0.1 mg Oral Q8H  . insulin aspart  0-9 Units Subcutaneous TID WC  . insulin glargine  13 Units Subcutaneous QHS  . LORazepam  0.5 mg Oral Q T,Th,Sa-HD  . multivitamin   Oral QHS  . sevelamer carbonate  2,400 mg Oral TID WC  . sodium chloride flush  3 mL Intravenous Q12H    Dialysis Orders: TTS, 3:30hours, 72kg, 2K/2.25Ca bath, No heparin, L AVG  Assessment/Plan: 1.  Dyspnea/Pulmonary edema: CXR with diffuse edema and LLL opacity. S/p 3L fluid removed 12/18 with HD, remains symptomatic. Planning on HD again today. 2. ESRD: Usually TTS schedule. Will need another HD today d/t pulmonary edema. 3. HTN/volume: See above. BP high with volume excess. 4. Anemia/?LGIB: Hgb 6.7 with + guaiacs. GI being consulted. Refusing blood products due to Jehovah's Witness status. Will give IV iron and Aranesp today with HD, discussed that will not correct anemia immediately. 5. Secondary hyperparathyroidism: Ca ok. Continue Renvela with meals. 6. Nutrition: Alb 3.0, will add supplements after GI evaluates. 7. Hx CVA: Per primary. 8. Type 2 DM: On insulin. Per primary. 9. Hx Bipolar: Per primary.  Veneta Penton, PA-C 07/22/2016, 9:34 AM  [facility] Pager: [phone]  Renal Attending: I agree with note as written above.  She refuses blood so we will attempt to stimulate erythropoiesis.  Dialyze for volume.Khyran Riera C

## 2016-07-22 NOTE — Progress Notes (Signed)
CRITICAL VALUE ALERT  Critical value received:  Hgb = 6.7  Date of notification:  07-22-2016  Time of notification:  7:47  Critical value read back:Yes.    Nurse who received alert:  Genelle Bal, RN  MD notified (1st page):  Broadus John  Time of first page:  07:51  MD notified (2nd page):  Time of second page:  Responding MD:  Broadus John  Time MD responded:  07:52
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
## 2016-07-23 DIAGNOSIS — D509 Iron deficiency anemia, unspecified: Secondary | ICD-10-CM

## 2016-07-23 LAB — RENAL FUNCTION PANEL
ANION GAP: 12 (ref 5–15)
Albumin: 3.3 g/dL — ABNORMAL LOW (ref 3.5–5.0)
BUN: 7 mg/dL (ref 6–20)
CALCIUM: 9 mg/dL (ref 8.9–10.3)
CHLORIDE: 95 mmol/L — AB (ref 101–111)
CO2: 32 mmol/L (ref 22–32)
Creatinine, Ser: 3.08 mg/dL — ABNORMAL HIGH (ref 0.44–1.00)
GFR calc Af Amer: 19 mL/min — ABNORMAL LOW (ref >60)
GFR calc non Af Amer: 17 mL/min — ABNORMAL LOW (ref >60)
GLUCOSE: 147 mg/dL — AB (ref 65–99)
Phosphorus: 2.7 mg/dL (ref 2.5–4.6)
Potassium: 3.8 mmol/L (ref 3.5–5.1)
SODIUM: 139 mmol/L (ref 135–145)

## 2016-07-23 LAB — GLUCOSE, CAPILLARY
GLUCOSE-CAPILLARY: 99 mg/dL (ref 65–99)
GLUCOSE-CAPILLARY: 143 mg/dL — AB (ref 65–99)
Glucose-Capillary: 223 mg/dL — ABNORMAL HIGH (ref 65–99)
Glucose-Capillary: 162 mg/dL — ABNORMAL HIGH (ref 65–99)

## 2016-07-23 LAB — BASIC METABOLIC PANEL
Anion gap: 12 (ref 5–15)
BUN: 15 mg/dL (ref 6–20)
CALCIUM: 8.5 mg/dL — AB (ref 8.9–10.3)
CHLORIDE: 91 mmol/L — AB (ref 101–111)
CO2: 32 mmol/L (ref 22–32)
CREATININE: 4.6 mg/dL — AB (ref 0.44–1.00)
GFR calc Af Amer: 12 mL/min — ABNORMAL LOW (ref >60)
GFR calc non Af Amer: 10 mL/min — ABNORMAL LOW (ref >60)
GLUCOSE: 176 mg/dL — AB (ref 65–99)
Potassium: 3.8 mmol/L (ref 3.5–5.1)
Sodium: 135 mmol/L (ref 135–145)

## 2016-07-23 LAB — CBC
HCT: 23.3 % — ABNORMAL LOW (ref 36.0–46.0)
HEMATOCRIT: 26 % — AB (ref 36.0–46.0)
HEMOGLOBIN: 8 g/dL — AB (ref 12.0–15.0)
Hemoglobin: 7.1 g/dL — ABNORMAL LOW (ref 12.0–15.0)
MCH: 30.1 pg (ref 26.0–34.0)
MCH: 30.3 pg (ref 26.0–34.0)
MCHC: 30.5 g/dL (ref 30.0–36.0)
MCHC: 30.8 g/dL (ref 30.0–36.0)
MCV: 98.7 fL (ref 78.0–100.0)
MCV: 98.5 fL (ref 78.0–100.0)
PLATELETS: 177 10*3/uL (ref 150–400)
Platelets: 242 10*3/uL (ref 150–400)
RBC: 2.36 MIL/uL — AB (ref 3.87–5.11)
RBC: 2.64 MIL/uL — ABNORMAL LOW (ref 3.87–5.11)
RDW: 16.5 % — AB (ref 11.5–15.5)
RDW: 16.2 % — ABNORMAL HIGH (ref 11.5–15.5)
WBC: 4.1 10*3/uL (ref 4.0–10.5)
WBC: 6 10*3/uL (ref 4.0–10.5)

## 2016-07-23 LAB — NO BLOOD PRODUCTS

## 2016-07-23 MED ORDER — SODIUM CHLORIDE 0.9 % IV SOLN
250.0000 mg | INTRAVENOUS | Status: AC
  Administered 2016-07-24: 250 mg via INTRAVENOUS
  Filled 2016-07-23 (×2): qty 20

## 2016-07-23 MED ORDER — PENTAFLUOROPROP-TETRAFLUOROETH EX AERO: 1.0000 "application " | INHALATION_SPRAY | CUTANEOUS | Status: DC | PRN

## 2016-07-23 MED ORDER — SODIUM CHLORIDE 0.9 % IV SOLN: 100.0000 mL | INTRAVENOUS | Status: DC | PRN

## 2016-07-23 MED ORDER — SODIUM CHLORIDE 0.9 % IV SOLN
250.0000 mg | INTRAVENOUS | Status: DC
  Filled 2016-07-23: qty 20

## 2016-07-23 MED ORDER — ALTEPLASE 2 MG IJ SOLR: 2.0000 mg | Freq: Once | INTRAMUSCULAR | Status: DC | PRN

## 2016-07-23 MED ORDER — CALCIUM ACETATE (PHOS BINDER) 667 MG PO CAPS
1334.0000 mg | ORAL_CAPSULE | Freq: Three times a day (TID) | ORAL | Status: DC
  Administered 2016-07-23 – 2016-07-26 (×6): 1334 mg via ORAL
  Filled 2016-07-23 (×6): qty 2

## 2016-07-23 MED ORDER — HEPARIN SODIUM (PORCINE) 1000 UNIT/ML DIALYSIS
20.0000 [IU]/kg | INTRAMUSCULAR | Status: DC | PRN
Start: 2016-07-23 — End: 2016-07-24

## 2016-07-23 MED ORDER — LIDOCAINE-PRILOCAINE 2.5-2.5 % EX CREA: 1.0000 "application " | TOPICAL_CREAM | CUTANEOUS | Status: DC | PRN

## 2016-07-23 MED ORDER — LIDOCAINE HCL (PF) 1 % IJ SOLN: 5.0000 mL | INTRAMUSCULAR | Status: DC | PRN

## 2016-07-23 MED ORDER — OXYCODONE HCL 5 MG PO TABS
ORAL_TABLET | ORAL | Status: AC
  Administered 2016-07-23: 10 mg via ORAL
  Filled 2016-07-23: qty 10

## 2016-07-23 MED ORDER — SODIUM CHLORIDE 0.9 % IV SOLN
250.0000 mg | INTRAVENOUS | Status: AC
  Administered 2016-07-23: 250 mg via INTRAVENOUS
  Filled 2016-07-23 (×2): qty 20

## 2016-07-23 MED ORDER — HEPARIN SODIUM (PORCINE) 1000 UNIT/ML DIALYSIS: 1000.0000 [IU] | INTRAMUSCULAR | Status: DC | PRN

## 2016-07-23 NOTE — Procedures (Signed)
On dialysis due to vol xs, some hypoxemia this AM.  Given IV iron. Hgb 7.1 today.  Will plan another HD in AM. Misty Foutz C

## 2016-07-23 NOTE — Progress Notes (Signed)
Patient signed ''Refusal of All Blood And/Or Blood Products Form " for religious reasons.

## 2016-07-23 NOTE — Progress Notes (Signed)
PT Cancellation Note  Patient Details Name: [name] MRN: [id number] DOB: [birth year]   Cancelled Treatment:    Reason Eval/Treat Not Completed: Fatigue/lethargy limiting ability to participate; fatigue after dialysis and with low hemoglobin.  Will attempt later as time permits as pt reports will need a cane for home.    Reginia Naas 07/23/2016, 2:47 PM  Magda Kiel, [place] 07/23/2016

## 2016-07-23 NOTE — Progress Notes (Signed)
PROGRESS NOTE    [name]  RDE:[id number] DOB: 1966-02-21 DOA: 07/21/2016 PCP: [name], MD  Brief Narrative: The patient is a 50 y.o. year-old with history of DM2, CVA, HTN, Jehovah's Witness, hepatitis C, combined CHF, bipolar d/o who presented to ED, 12/17 , she reports dark red blood per rectum Tuesday and smaller amounts there after. She also had some N/V for the last several days and due to this missed HD x 2.   CXR with pulm edema ,Hb down to 8.1->6.7 heme positive. Hypoxic getting extra HD and GI consulting  Assessment & Plan:   Lower GI bleed in the setting of  Iron deficiency anemia and Anemia of Chronic disease.  -hemoccult positive /Hb down to 6.7, reports dark red blood per rectum on Tuesday -refused blood transfusion-Jehovah's witness, explained risks -continue maximal IV Iron and Epo with HD -GI consulted, colonoscopy 2014 - with hemorrhoids only and EGD -2016 was normal  -possibly would be hemorrhoidal bleeding alone, although history not clearly suggestive of such -appreciate GI input, observe for now -CBC in am  Anemia -due to CKD, and small component of blood loss and hemodilution from volume overload -monitor Hb, declines blood-discussed again  -continue maximal EPo and Iron with HD  Volume overload/ESRD on HD MWF TThS -with hypoxia -missed HD x2, extra HD per Renal, extra fluid removal, wean O2 down as tolerated  Chronic combined systolic and diastolic CHF -  last 2D echo in June 6314 showed Systolic function was mildly reduced.  45% to 50% Diffuse hypokinesis.  grade 2 DD - volume managed with HD  Nausea, vomiting diarrhea,  -could be  to viral gastroenteritis compounded by uremia and missed HD,  -resolved -no further N/V at this time  Type II Diabetes  With Neuropathy  -poor med compliance -last Hba1c 9.4  GERD, Continue PPI  Hypertension  -Continue home anti-hypertensive medications  History of Bipolar disorder  -Continue  her home meds   Right great toe wound,  -due to neuropathy and fracture noted per foot X ray. No osteomyelitis is suspected -Wound Care consulted  DVT prophylaxis:  SCD's    Code Status:   Full     Family Communication:  Discussed with patient Disposition Plan: home in 1-2days    Consultants:   [facility]    Subjective: Feels weak, dyspneic with activity, O2 dropped to 79% this am  Objective: Vitals:   07/23/16 1200 07/23/16 1230 07/23/16 1255 07/23/16 1347  BP: (!) 207/81 (!) 194/77 (!) 189/59 134/75  Pulse: 80 84 85 90  Resp: (!) 22 13 11 15   Temp:   98.2 F (36.8 C)   TempSrc:   Oral   SpO2:    95%  Weight:   69.9 kg (154 lb 1.6 oz)   Height:        Intake/Output Summary (Last 24 hours) at 07/23/16 1440 Last data filed at 07/23/16 1255  Gross per 24 hour  Intake              840 ml  Output             6000 ml  Net            -5160 ml   Filed Weights   07/22/16 2115 07/23/16 0909 07/23/16 1255  Weight: 71.4 kg (157 lb 6.5 oz) 73 kg (160 lb 15 oz) 69.9 kg (154 lb 1.6 oz)    Examination:  General exam: Appears calm and comfortable, AAOx3, no distress  Respiratory system: basilar crackles noted Cardiovascular system: S1 & S2 heard, RRR. No JVD, murmurs, rubs, gallops or clicks. No pedal edema. Gastrointestinal system: Abdomen is nondistended, soft and nontender. No organomegaly or masses felt. Normal bowel sounds heard. Central nervous system: Alert and oriented. No focal neurological deficits. Extremities: 2plus edema Skin: No rashes, lesions or ulcers Psychiatry: Judgement and insight appear normal. Mood & affect appropriate.     Data Reviewed: I have personally reviewed following labs and imaging studies  CBC:  Recent Labs Lab 07/21/16 0915 07/22/16 0614 07/23/16 0419  WBC 4.4 4.0 4.1  HGB 8.1* 6.7* 7.1*  HCT 27.0* 22.5* 23.3*  MCV 98.2 98.3 98.7  PLT 191 155 413   Basic Metabolic Panel:  Recent Labs Lab 07/21/16 0915 07/22/16 0614  07/23/16 0419  NA 137 136 135  K 5.4* 4.8 3.8  CL 85* 90* 91*  CO2 33* 34* 32  GLUCOSE 303* 124* 176*  BUN 44* 23* 15  CREATININE 9.23* 6.27* 4.60*  CALCIUM 8.5* 8.4* 8.5*   GFR: Estimated Creatinine Clearance: 14 mL/min (by C-G formula based on SCr of 4.6 mg/dL (H)). Liver Function Tests:  Recent Labs Lab 07/21/16 0915 07/22/16 0614  AST 35 29  ALT 22 21  ALKPHOS 83 67  BILITOT 1.1 1.0  PROT 7.4 6.4*  ALBUMIN 3.4* 3.0*   No results for input(s): LIPASE, AMYLASE in the last 168 hours. No results for input(s): AMMONIA in the last 168 hours. Coagulation Profile:  Recent Labs Lab 07/21/16 0915  INR 1.35   Cardiac Enzymes: No results for input(s): CKTOTAL, CKMB, CKMBINDEX, TROPONINI in the last 168 hours. BNP (last 3 results) No results for input(s): PROBNP in the last 8760 hours. HbA1C:  Recent Labs  07/21/16 1507  HGBA1C 8.7*   CBG:  Recent Labs Lab 07/22/16 1140 07/22/16 1820 07/22/16 2115 07/23/16 0748 07/23/16 1342  GLUCAP 146* 115* 173* 223* 99   Lipid Profile: No results for input(s): CHOL, HDL, LDLCALC, TRIG, CHOLHDL, LDLDIRECT in the last 72 hours. Thyroid Function Tests: No results for input(s): TSH, T4TOTAL, FREET4, T3FREE, THYROIDAB in the last 72 hours. Anemia Panel: No results for input(s): VITAMINB12, FOLATE, FERRITIN, TIBC, IRON, RETICCTPCT in the last 72 hours. Urine analysis:    Component Value Date/Time   COLORURINE YELLOW 09/17/2015 0108   APPEARANCEUR CLEAR 09/17/2015 0108   LABSPEC 1.013 09/17/2015 0108   PHURINE 8.0 09/17/2015 0108   GLUCOSEU NEGATIVE 09/17/2015 0108   HGBUR SMALL (A) 09/17/2015 0108   BILIRUBINUR NEGATIVE 09/17/2015 0108   BILIRUBINUR neg 12/13/2014 1127   KETONESUR NEGATIVE 09/17/2015 0108   PROTEINUR >300 (A) 09/17/2015 0108   UROBILINOGEN 0.2 05/01/2015 1252   NITRITE NEGATIVE 09/17/2015 0108   LEUKOCYTESUR NEGATIVE 09/17/2015 0108   Sepsis  Labs: @LABRCNTIP (procalcitonin:4,lacticidven:4)  ) Recent Results (from the past 240 hour(s))  MRSA PCR Screening     Status: None   Collection Time: 07/22/16  6:24 AM  Result Value Ref Range Status   MRSA by PCR NEGATIVE NEGATIVE Final    Comment:        The GeneXpert MRSA Assay (FDA approved for NASAL specimens only), is one component of a comprehensive MRSA colonization surveillance program. It is not intended to diagnose MRSA infection nor to guide or monitor treatment for MRSA infections.          Radiology Studies: No results found.      Scheduled Meds: . sodium chloride   Intravenous Once  . amLODipine  10 mg Oral QHS  .  atorvastatin  40 mg Oral QHS  . calcium acetate  1,334 mg Oral TID WC  . carvedilol  3.125 mg Oral BID WC  . cloNIDine  0.1 mg Oral Q8H  . darbepoetin (ARANESP) injection - DIALYSIS  200 mcg Intravenous Q Mon-HD  . [START ON 07/24/2016] ferric gluconate (FERRLECIT/NULECIT) IV  250 mg Intravenous Q M,W,F-HD  . insulin aspart  0-9 Units Subcutaneous TID WC  . insulin glargine  13 Units Subcutaneous QHS  . LORazepam  0.5 mg Oral Q T,Th,Sa-HD  . multivitamin   Oral QHS  . sevelamer carbonate  2,400 mg Oral TID WC  . silver sulfADIAZINE   Topical Daily  . sodium chloride flush  3 mL Intravenous Q12H   Continuous Infusions:   LOS: 1 day    Time spent: 28min    Domenic Polite, MD [facility] Pager [phone]  If 7PM-7AM, please contact night-coverage [email or web address] Password [email or web address] 07/23/2016, 2:40 PM

## 2016-07-23 NOTE — Progress Notes (Signed)
Signed form of ''Refusal of All Blood And/Or Blood Products Form '' faxed to blood bank.

## 2016-07-23 NOTE — Progress Notes (Signed)
[facility] Progress Note    Since last GI note: No overt GI bleeding. NO BM of any type in 2-3 days now. She feels weak overall.  Objective: Vital signs in last 24 hours: Temp:  [97.7 F (36.5 C)-98.5 F (36.9 C)] 98.2 F (36.8 C) (12/19 1255) Pulse Rate:  [78-95] 90 (12/19 1347) Resp:  [11-22] 15 (12/19 1347) BP: (134-207)/(59-88) 134/75 (12/19 1347) SpO2:  [79 %-100 %] 95 % (12/19 1347) Weight:  [154 lb 1.6 oz (69.9 kg)-160 lb 15 oz (73 kg)] 154 lb 1.6 oz (69.9 kg) (12/19 1255) Last BM Date: 07/20/16 General: alert and oriented times 3 Heart: regular rate and rythm Abdomen: soft, non-tender, non-distended, normal bowel sounds   Lab Results:  Recent Labs  07/21/16 0915 07/22/16 0614 07/23/16 0419  WBC 4.4 4.0 4.1  HGB 8.1* 6.7* 7.1*  PLT 191 155 177  MCV 98.2 98.3 98.7    Recent Labs  07/21/16 0915 07/22/16 0614 07/23/16 0419  NA 137 136 135  K 5.4* 4.8 3.8  CL 85* 90* 91*  CO2 33* 34* 32  GLUCOSE 303* 124* 176*  BUN 44* 23* 15  CREATININE 9.23* 6.27* 4.60*  CALCIUM 8.5* 8.4* 8.5*    Recent Labs  07/21/16 0915 07/22/16 0614  PROT 7.4 6.4*  ALBUMIN 3.4* 3.0*  AST 35 29  ALT 22 21  ALKPHOS 83 67  BILITOT 1.1 1.0    Recent Labs  07/21/16 0915  INR 1.35     Studies/Results: No results found.   Medications: Scheduled Meds: . sodium chloride   Intravenous Once  . amLODipine  10 mg Oral QHS  . atorvastatin  40 mg Oral QHS  . calcium acetate  1,334 mg Oral TID WC  . carvedilol  3.125 mg Oral BID WC  . cloNIDine  0.1 mg Oral Q8H  . darbepoetin (ARANESP) injection - DIALYSIS  200 mcg Intravenous Q Mon-HD  . [START ON 07/24/2016] ferric gluconate (FERRLECIT/NULECIT) IV  250 mg Intravenous Q M,W,F-HD  . insulin aspart  0-9 Units Subcutaneous TID WC  . insulin glargine  13 Units Subcutaneous QHS  . LORazepam  0.5 mg Oral Q T,Th,Sa-HD  . multivitamin   Oral QHS  . sevelamer carbonate  2,400 mg Oral TID WC  . silver  sulfADIAZINE   Topical Daily  . sodium chloride flush  3 mL Intravenous Q12H   Continuous Infusions: PRN Meds:.acetaminophen, diphenhydrAMINE, LORazepam, oxyCODONE, polyvinyl alcohol, senna-docusate, traZODone    Assessment/Plan: 50 y.o. female with minor lower GI bleed  Red rectal bleeding in setting of large, tough to move BM.  She had colonoscopy and upper endoscopy in past 2-3 years by [facility].  I don't think either needs to be repeated since her bleeding has stopped. She declines blood transfusion for religious reasons and understands that it will take weeks for her blood counts to normalize.  Iron transfusion during HD is reasonable.  No plans for further GI testing unless she has obvious overt bleeding.  Please call or page with any further questions or concerns.     [name] Banister, MD  07/23/2016, 2:47 PM [facility] Pager [phone]

## 2016-07-24 ENCOUNTER — Ambulatory Visit: Payer: Self-pay | Admitting: Physician Assistant

## 2016-07-24 LAB — RENAL FUNCTION PANEL
ALBUMIN: 3 g/dL — AB (ref 3.5–5.0)
Anion gap: 7 (ref 5–15)
BUN: 10 mg/dL (ref 6–20)
CHLORIDE: 96 mmol/L — AB (ref 101–111)
CO2: 33 mmol/L — ABNORMAL HIGH (ref 22–32)
Calcium: 8.4 mg/dL — ABNORMAL LOW (ref 8.9–10.3)
Creatinine, Ser: 3.28 mg/dL — ABNORMAL HIGH (ref 0.44–1.00)
GFR, EST AFRICAN AMERICAN: 18 mL/min — AB (ref >60)
GFR, EST NON AFRICAN AMERICAN: 15 mL/min — AB (ref >60)
Glucose, Bld: 138 mg/dL — ABNORMAL HIGH (ref 65–99)
PHOSPHORUS: 2.2 mg/dL — AB (ref 2.5–4.6)
POTASSIUM: 3.8 mmol/L (ref 3.5–5.1)
Sodium: 136 mmol/L (ref 135–145)

## 2016-07-24 LAB — GLUCOSE, CAPILLARY
GLUCOSE-CAPILLARY: 220 mg/dL — AB (ref 65–99)
GLUCOSE-CAPILLARY: 102 mg/dL — AB (ref 65–99)
Glucose-Capillary: 101 mg/dL — ABNORMAL HIGH (ref 65–99)

## 2016-07-24 LAB — CBC
HEMATOCRIT: 20.7 % — AB (ref 36.0–46.0)
HEMATOCRIT: 25.6 % — AB (ref 36.0–46.0)
HEMOGLOBIN: 6.3 g/dL — AB (ref 12.0–15.0)
Hemoglobin: 7.7 g/dL — ABNORMAL LOW (ref 12.0–15.0)
MCH: 30 pg (ref 26.0–34.0)
MCH: 29.6 pg (ref 26.0–34.0)
MCHC: 30.4 g/dL (ref 30.0–36.0)
MCHC: 30.1 g/dL (ref 30.0–36.0)
MCV: 98.6 fL (ref 78.0–100.0)
MCV: 98.5 fL (ref 78.0–100.0)
PLATELETS: 253 10*3/uL (ref 150–400)
Platelets: 173 10*3/uL (ref 150–400)
RBC: 2.1 MIL/uL — AB (ref 3.87–5.11)
RBC: 2.6 MIL/uL — AB (ref 3.87–5.11)
RDW: 16.3 % — AB (ref 11.5–15.5)
RDW: 16.1 % — AB (ref 11.5–15.5)
WBC: 4 10*3/uL (ref 4.0–10.5)
WBC: 5.5 10*3/uL (ref 4.0–10.5)

## 2016-07-24 MED ORDER — CALCIUM ACETATE (PHOS BINDER) 667 MG PO CAPS: 1334.0000 mg | ORAL_CAPSULE | Freq: Three times a day (TID) | ORAL | 0 refills | Status: DC

## 2016-07-24 MED ORDER — SODIUM CHLORIDE 0.9 % IV SOLN
250.0000 mg | INTRAVENOUS | Status: AC
  Administered 2016-07-25: 250 mg via INTRAVENOUS
  Filled 2016-07-24 (×2): qty 20

## 2016-07-24 MED ORDER — PRO-STAT SUGAR FREE PO LIQD
30.0000 mL | Freq: Two times a day (BID) | ORAL | Status: DC
  Administered 2016-07-24 – 2016-07-25 (×2): 30 mL via ORAL
  Filled 2016-07-24 (×2): qty 30

## 2016-07-24 MED ORDER — SENNOSIDES-DOCUSATE SODIUM 8.6-50 MG PO TABS: 1.0000 | ORAL_TABLET | Freq: Two times a day (BID) | ORAL | 0 refills | Status: DC

## 2016-07-24 MED ORDER — HYDROCODONE-ACETAMINOPHEN 5-325 MG PO TABS
1.0000 | ORAL_TABLET | Freq: Once | ORAL | Status: AC
  Administered 2016-07-25: 1 via ORAL
  Filled 2016-07-24: qty 1

## 2016-07-24 MED ORDER — PRO-STAT SUGAR FREE PO LIQD: 30.0000 mL | Freq: Two times a day (BID) | ORAL | 0 refills | Status: DC

## 2016-07-24 MED ORDER — POLYETHYLENE GLYCOL 3350 17 G PO PACK: 17.0000 g | PACK | Freq: Every day | ORAL | 0 refills | Status: DC

## 2016-07-24 MED ORDER — FOLIC ACID 1 MG PO TABS
1.0000 mg | ORAL_TABLET | Freq: Two times a day (BID) | ORAL | Status: DC
  Administered 2016-07-25 – 2016-07-26 (×2): 1 mg via ORAL
  Filled 2016-07-24 (×2): qty 1

## 2016-07-24 NOTE — Progress Notes (Signed)
[facility] Progress Note   Subjective:  Dyspnea a little improved today, still tired. No CP. GI has evaluated her, no plans for EGD/colonoscopy at this time since bleeding has resolved. She feels like she is getting a little constipated now.  Objective Vitals:   07/23/16 1651 07/23/16 2035 07/24/16 0519 07/24/16 0834  BP: (!) 154/80 (!) 192/78 (!) 166/75   Pulse: 92 91 86 90  Resp: 16 18 16    Temp: 97.7 F (36.5 C) 98.5 F (36.9 C) 98.8 F (37.1 C)   TempSrc: Oral     SpO2: 100% 100% 93%   Weight:      Height:       Physical Exam General: Well appearing female, on nasal oxygen. NAD. Heart: RRR; no murmur. Lungs: Rales in B bases Abdomen: Soft, non-tender Extremities: Trace LE edema Dialysis Access: L forearm AVG + thrill/bruit  Additional Objective Labs: Basic Metabolic Panel:  Recent Labs Lab 07/22/16 0614 07/23/16 0419 07/23/16 2104  NA 136 135 139  K 4.8 3.8 3.8  CL 90* 91* 95*  CO2 34* 32 32  GLUCOSE 124* 176* 147*  BUN 23* 15 7  CREATININE 6.27* 4.60* 3.08*  CALCIUM 8.4* 8.5* 9.0  PHOS  --   --  2.7   Liver Function Tests:  Recent Labs Lab 07/21/16 0915 07/22/16 0614 07/23/16 2104  AST 35 29  --   ALT 22 21  --   ALKPHOS 83 67  --   BILITOT 1.1 1.0  --   PROT 7.4 6.4*  --   ALBUMIN 3.4* 3.0* 3.3*   CBC:  Recent Labs Lab 07/21/16 0915 07/22/16 0614 07/23/16 0419 07/23/16 2104  WBC 4.4 4.0 4.1 6.0  HGB 8.1* 6.7* 7.1* 8.0*  HCT 27.0* 22.5* 23.3* 26.0*  MCV 98.2 98.3 98.7 98.5  PLT 191 155 177 242  CBG:  Recent Labs Lab 07/23/16 0748 07/23/16 1342 07/23/16 1647 07/23/16 2112 07/24/16 0742  GLUCAP 223* 99 162* 143* 220*   Medications:  . sodium chloride   Intravenous Once  . amLODipine  10 mg Oral QHS  . atorvastatin  40 mg Oral QHS  . calcium acetate  1,334 mg Oral TID WC  . carvedilol  3.125 mg Oral BID WC  . cloNIDine  0.1 mg Oral Q8H  . darbepoetin (ARANESP) injection - DIALYSIS  200 mcg Intravenous Q  Mon-HD  . ferric gluconate (FERRLECIT/NULECIT) IV  250 mg Intravenous Q M,W,F-HD  . insulin aspart  0-9 Units Subcutaneous TID WC  . insulin glargine  13 Units Subcutaneous QHS  . LORazepam  0.5 mg Oral Q T,Th,Sa-HD  . multivitamin   Oral QHS  . sevelamer carbonate  2,400 mg Oral TID WC  . silver sulfADIAZINE   Topical Daily  . sodium chloride flush  3 mL Intravenous Q12H    Dialysis Orders: TTS at [facility], 3:30hours, 72kg (will be much lower on d/c), 2K/2.25Ca bath, No heparin, L AVG  Assessment/Plan: 1. Dyspnea/Pulmonary edema: Admission CXR with diffuse edema and LLL opacity. Improving with serial HD sessions. 2. ESRD: Usually TTS schedule, daily HD until volume corrected. 3. HTN/volume: See above. BP high with volume excess. Will need EDW lowered on d/c. 4. Anemia/?LGIB: Hgb 6.7 with + guaiacs on admit, up to 8 today (hemodilution was likely playing role). GI consulted, no plan for scope. Refusing blood products due to Jehovah's Witness status. Getting course of IV iron and given Aranesp 12/18, knows that will not correct anemia immediately. 5. Secondary  hyperparathyroidism: Ca/Phos ok. Continue Renvela with meals. 6. Nutrition: Alb 3.3, adding protein supps. 7. Hx CVA: Per primary. 8. Type 2 DM: On insulin. Per primary. 9. Hx Bipolar: Per primary.   [name] Penton, PA-C 07/24/2016, 10:11 AM  [facility] Pager: [phone]   Renal Attending: Much improved with volume removal.  Hbg rising due to hemoconcentration. Will establish new EDW. Loria Lacina C

## 2016-07-24 NOTE — Progress Notes (Signed)
CRITICAL VALUE ALERT  Critical value received:  Hgb 6.3  Date of notification:  07/24/16  Time of notification:  7948  Critical value read back: yes  Nurse who received alert:  Wallace Cullens   MD notified (1st page):  Florene Glen   Time of first page:  60 MD notified (2nd page):  Time of second page  Responding MD:  Florene Glen  Time MD responded:  0165

## 2016-07-24 NOTE — Progress Notes (Signed)
PROGRESS NOTE    [name]  NAT:[id number] DOB: Oct 13, 1965 DOA: 07/21/2016 PCP: [name], MD  Brief Narrative: The patient is a 50 y.o. year-old with history of DM2, CVA, HTN, Jehovah's Witness, hepatitis C, combined CHF, bipolar d/o who presented to ED, 12/17 , she reports dark red blood per rectum Tuesday and smaller amounts there after. She also had some N/V for the last several days and due to this missed HD x 2.   CXR with pulm edema ,Hb down to 8.1->6.7 heme positive. Hypoxic getting extra HD and GI consulting  Assessment & Plan:   Lower GI bleed in the setting of  Iron deficiency anemia and Anemia of Chronic disease.  -hemoccult positive /Hb down to 6.3, reports dark red blood per rectum on Tuesday -refused blood transfusion-Jehovah's witness, explained risks -continue maximal IV Iron and Epo with HD -GI consulted, colonoscopy 2014 - with hemorrhoids only and EGD -2016 was normal  -possibly would be hemorrhoidal bleeding alone, although history not clearly suggestive of such -appreciate GI input, observe for now -if CBC continues to drop, we will discuss with GI again to evaluate the patient.  Not on any antiplatelet due to this.  Anemia -due to CKD, and small component of blood loss and hemodilution from volume overload -monitor Hb, declines blood-discussed again  -continue maximal EPo and Iron with HD  Volume overload/ESRD on HD MWF TThS -with hypoxia -missed HD x2, extra HD per Renal, extra fluid removal, wean O2 down as tolerated  Chronic combined systolic and diastolic CHF -  last 2D echo in June 4270 showed Systolic function was mildly reduced.  45% to 50% Diffuse hypokinesis.  grade 2 DD - volume managed with HD  Nausea, vomiting diarrhea,  -could be  to viral gastroenteritis compounded by uremia and missed HD,  -resolved -no further N/V at this time  Type II Diabetes  With Neuropathy  -poor med compliance -last Hba1c 9.4  GERD, Continue  PPI  Hypertension  -Continue home anti-hypertensive medications  History of Bipolar disorder  -Continue her home meds   Right great toe wound,  -due to neuropathy and fracture noted per foot X ray. No osteomyelitis is suspected -Wound Care consulted  Pain control. Patient complains about generalized body ache and requesting her "home pain medication". On [place] control substances review, patient has never received any pain medication prescriptions and last 1 year. Currently we will use Tylenol only.  DVT prophylaxis:  SCD's    Code Status:   Full     Family Communication:  Discussed with patient Disposition Plan: home in 1-2days  Consultants:   [facility]  Subjective: Has multiple complaints, generalized body ache. Diffuse fatigue and tired. Concerned that patient is getting constipated. Requesting her "home pain medication"  Objective: Vitals:   07/24/16 1700 07/24/16 1730 07/24/16 1748 07/24/16 1822  BP: (!) 175/80 (!) 167/74 (!) 181/84 (!) 206/78  Pulse: 84 84 86 92  Resp:   (!) 24 19  Temp:   98.3 F (36.8 C) 98.2 F (36.8 C)  TempSrc:   Oral Oral  SpO2:   100% 100%  Weight:   65 kg (143 lb 4.8 oz)   Height:        Intake/Output Summary (Last 24 hours) at 07/24/16 1833 Last data filed at 07/24/16 1748  Gross per 24 hour  Intake              530 ml  Output  3005 ml  Net            -2475 ml   Filed Weights   07/23/16 1255 07/24/16 1407 07/24/16 1748  Weight: 69.9 kg (154 lb 1.6 oz) 68 kg (149 lb 14.6 oz) 65 kg (143 lb 4.8 oz)    Examination:  General exam: Appears calm and comfortable, AAOx3, no distress Respiratory system: basilar crackles noted Cardiovascular system: S1 & S2 heard, RRR. No JVD, murmurs, rubs, gallops or clicks. No pedal edema. Gastrointestinal system: Abdomen is nondistended, soft and nontender. No organomegaly or masses felt. Normal bowel sounds heard. Central nervous system: Alert and oriented. No focal neurological  deficits. Extremities: 2plus edema Skin: No rashes, lesions or ulcers Psychiatry: Judgement and insight appear normal. Mood & affect appropriate.     Data Reviewed: I have personally reviewed following labs and imaging studies  CBC:  Recent Labs Lab 07/21/16 0915 07/22/16 0614 07/23/16 0419 07/23/16 2104 07/24/16 1439  WBC 4.4 4.0 4.1 6.0 4.0  HGB 8.1* 6.7* 7.1* 8.0* 6.3*  HCT 27.0* 22.5* 23.3* 26.0* 20.7*  MCV 98.2 98.3 98.7 98.5 98.6  PLT 191 155 177 242 426   Basic Metabolic Panel:  Recent Labs Lab 07/21/16 0915 07/22/16 0614 07/23/16 0419 07/23/16 2104 07/24/16 1439  NA 137 136 135 139 136  K 5.4* 4.8 3.8 3.8 3.8  CL 85* 90* 91* 95* 96*  CO2 33* 34* 32 32 33*  GLUCOSE 303* 124* 176* 147* 138*  BUN 44* 23* 15 7 10   CREATININE 9.23* 6.27* 4.60* 3.08* 3.28*  CALCIUM 8.5* 8.4* 8.5* 9.0 8.4*  PHOS  --   --   --  2.7 2.2*   GFR: Estimated Creatinine Clearance: 17.7 mL/min (by C-G formula based on SCr of 3.28 mg/dL (H)). Liver Function Tests:  Recent Labs Lab 07/21/16 0915 07/22/16 0614 07/23/16 2104 07/24/16 1439  AST 35 29  --   --   ALT 22 21  --   --   ALKPHOS 83 67  --   --   BILITOT 1.1 1.0  --   --   PROT 7.4 6.4*  --   --   ALBUMIN 3.4* 3.0* 3.3* 3.0*   No results for input(s): LIPASE, AMYLASE in the last 168 hours. No results for input(s): AMMONIA in the last 168 hours. Coagulation Profile:  Recent Labs Lab 07/21/16 0915  INR 1.35   Cardiac Enzymes: No results for input(s): CKTOTAL, CKMB, CKMBINDEX, TROPONINI in the last 168 hours. BNP (last 3 results) No results for input(s): PROBNP in the last 8760 hours. HbA1C: No results for input(s): HGBA1C in the last 72 hours. CBG:  Recent Labs Lab 07/23/16 1647 07/23/16 2112 07/24/16 0742 07/24/16 1136 07/24/16 1825  GLUCAP 162* 143* 220* 102* 101*   Lipid Profile: No results for input(s): CHOL, HDL, LDLCALC, TRIG, CHOLHDL, LDLDIRECT in the last 72 hours. Thyroid Function Tests: No  results for input(s): TSH, T4TOTAL, FREET4, T3FREE, THYROIDAB in the last 72 hours. Anemia Panel: No results for input(s): VITAMINB12, FOLATE, FERRITIN, TIBC, IRON, RETICCTPCT in the last 72 hours. Urine analysis:    Component Value Date/Time   COLORURINE YELLOW 09/17/2015 0108   APPEARANCEUR CLEAR 09/17/2015 0108   LABSPEC 1.013 09/17/2015 0108   PHURINE 8.0 09/17/2015 0108   GLUCOSEU NEGATIVE 09/17/2015 0108   HGBUR SMALL (A) 09/17/2015 0108   BILIRUBINUR NEGATIVE 09/17/2015 0108   BILIRUBINUR neg 12/13/2014 1127   KETONESUR NEGATIVE 09/17/2015 0108   PROTEINUR >300 (A) 09/17/2015 0108   UROBILINOGEN  0.2 05/01/2015 1252   NITRITE NEGATIVE 09/17/2015 0108   LEUKOCYTESUR NEGATIVE 09/17/2015 0108   Recent Results (from the past 240 hour(s))  MRSA PCR Screening     Status: None   Collection Time: 07/22/16  6:24 AM  Result Value Ref Range Status   MRSA by PCR NEGATIVE NEGATIVE Final    Comment:        The GeneXpert MRSA Assay (FDA approved for NASAL specimens only), is one component of a comprehensive MRSA colonization surveillance program. It is not intended to diagnose MRSA infection nor to guide or monitor treatment for MRSA infections.    Radiology Studies: No results found.   Scheduled Meds: . sodium chloride   Intravenous Once  . amLODipine  10 mg Oral QHS  . atorvastatin  40 mg Oral QHS  . calcium acetate  1,334 mg Oral TID WC  . carvedilol  3.125 mg Oral BID WC  . cloNIDine  0.1 mg Oral Q8H  . darbepoetin (ARANESP) injection - DIALYSIS  200 mcg Intravenous Q Mon-HD  . feeding supplement (PRO-STAT SUGAR FREE 64)  30 mL Oral BID  . [START ON 07/25/2016] ferric gluconate (FERRLECIT/NULECIT) IV  250 mg Intravenous Q T,Th,Sa-HD  . folic acid  1 mg Oral BID WC  . insulin aspart  0-9 Units Subcutaneous TID WC  . insulin glargine  13 Units Subcutaneous QHS  . LORazepam  0.5 mg Oral Q T,Th,Sa-HD  . multivitamin   Oral QHS  . sevelamer carbonate  2,400 mg Oral TID  WC  . silver sulfADIAZINE   Topical Daily  . sodium chloride flush  3 mL Intravenous Q12H   Continuous Infusions:   LOS: 2 days    Time spent: 7min  Author:  Berle Mull, MD [facility] Pager: [phone] 07/24/2016 6:34 PM    If 7PM-7AM, please contact night-coverage [email or web address] Password [email or web address] 07/24/2016, 6:33 PM

## 2016-07-24 NOTE — Progress Notes (Signed)
PT Cancellation Note  Patient Details Name: [name] MRN: [id number] DOB: 12-29-1965   Cancelled Treatment:    Reason Eval/Treat Not Completed: Patient declined, no reason specified.  Pt stated she could not do anything prior to HD, but if this therapist was around after HD, she would do a little. 07/24/2016  Donnella Sham, [place] [phone]  (pager)   Tessie Fass Chantilly Linskey 07/24/2016, 12:11 PM

## 2016-07-25 LAB — GLUCOSE, CAPILLARY
GLUCOSE-CAPILLARY: 124 mg/dL — AB (ref 65–99)
GLUCOSE-CAPILLARY: 123 mg/dL — AB (ref 65–99)
Glucose-Capillary: 263 mg/dL — ABNORMAL HIGH (ref 65–99)
Glucose-Capillary: 197 mg/dL — ABNORMAL HIGH (ref 65–99)

## 2016-07-25 MED ORDER — POLYETHYLENE GLYCOL 3350 17 G PO PACK
17.0000 g | PACK | Freq: Every day | ORAL | Status: DC
  Administered 2016-07-26: 17 g via ORAL
  Filled 2016-07-25: qty 1

## 2016-07-25 MED ORDER — FOLIC ACID 1 MG PO TABS: 1.0000 mg | ORAL_TABLET | Freq: Two times a day (BID) | ORAL | 0 refills | Status: DC

## 2016-07-25 NOTE — Progress Notes (Signed)
[facility] Progress Note   Subjective:  Dyspnea improved, walking around room without oxygen. No CP. Hgb remains low, but improved from yesterday.  Objective Vitals:   07/24/16 1822 07/24/16 2049 07/25/16 0526 07/25/16 0914  BP: (!) 206/78 (!) 171/69 (!) 154/69 (!) 174/75  Pulse: 92  76 84  Resp: 19 18 18 18   Temp: 98.2 F (36.8 C) 99.2 F (37.3 C) 98.2 F (36.8 C) 98 F (36.7 C)  TempSrc: Oral Oral Oral Oral  SpO2: 100% 100% 98% 99%  Weight:  67.5 kg (148 lb 12.8 oz)    Height:  5\' 4"  (1.626 m)     Physical Exam General: Well appearing female, on nasal oxygen. NAD. Heart: RRR; no murmur. Lungs: Rales in B bases Abdomen: Soft, non-tender Extremities: Trace LE edema Dialysis Access: L forearm AVG + thrill/bruit  Additional Objective Labs: Basic Metabolic Panel:  Recent Labs Lab 07/23/16 0419 07/23/16 2104 07/24/16 1439  NA 135 139 136  K 3.8 3.8 3.8  CL 91* 95* 96*  CO2 32 32 33*  GLUCOSE 176* 147* 138*  BUN 15 7 10   CREATININE 4.60* 3.08* 3.28*  CALCIUM 8.5* 9.0 8.4*  PHOS  --  2.7 2.2*   Liver Function Tests:  Recent Labs Lab 07/21/16 0915 07/22/16 0614 07/23/16 2104 07/24/16 1439  AST 35 29  --   --   ALT 22 21  --   --   ALKPHOS 83 67  --   --   BILITOT 1.1 1.0  --   --   PROT 7.4 6.4*  --   --   ALBUMIN 3.4* 3.0* 3.3* 3.0*   CBC:  Recent Labs Lab 07/22/16 0614 07/23/16 0419 07/23/16 2104 07/24/16 1439 07/24/16 1903  WBC 4.0 4.1 6.0 4.0 5.5  HGB 6.7* 7.1* 8.0* 6.3* 7.7*  HCT 22.5* 23.3* 26.0* 20.7* 25.6*  MCV 98.3 98.7 98.5 98.6 98.5  PLT 155 177 242 173 253   CBG:  Recent Labs Lab 07/23/16 2112 07/24/16 0742 07/24/16 1136 07/24/16 1825 07/25/16 0813  GLUCAP 143* 220* 102* 101* 263*   Medications:  . sodium chloride   Intravenous Once  . amLODipine  10 mg Oral QHS  . atorvastatin  40 mg Oral QHS  . calcium acetate  1,334 mg Oral TID WC  . carvedilol  3.125 mg Oral BID WC  . cloNIDine  0.1 mg Oral Q8H   . darbepoetin (ARANESP) injection - DIALYSIS  200 mcg Intravenous Q Mon-HD  . feeding supplement (PRO-STAT SUGAR FREE 64)  30 mL Oral BID  . ferric gluconate (FERRLECIT/NULECIT) IV  250 mg Intravenous Q T,Th,Sa-HD  . folic acid  1 mg Oral BID WC  . insulin aspart  0-9 Units Subcutaneous TID WC  . insulin glargine  13 Units Subcutaneous QHS  . LORazepam  0.5 mg Oral Q T,Th,Sa-HD  . multivitamin   Oral QHS  . sevelamer carbonate  2,400 mg Oral TID WC  . silver sulfADIAZINE   Topical Daily  . sodium chloride flush  3 mL Intravenous Q12H    Dialysis Orders: TTS at [facility], 3:30hours, 72kg (will be much lower on d/c), 2K/2.25Ca bath, No heparin, L AVG  Assessment/Plan: 1. Dyspnea/Pulmonary edema: Admission CXR with diffuse edema and LLL opacity. Improving with serial HD sessions. 2. ESRD: Usually TTS schedule, daily HD until volume corrected. Will be dialyzed today (12/21). 3. HTN/volume: See above. BP high with volume excess. Will need EDW lowered on d/c. 4. Anemia/?LGIB: Hgb 6.7with +  guaiacs on admit, 7.7 today. GI consulted, no plan for scope. Refusing blood products due to Jehovah's Witness status. Getting course of IV iron and given Aranesp 12/18, knows that will not correct anemia immediately. 5. Secondary hyperparathyroidism: Ca/Phos ok. Continue Renvela with meals. 6. Nutrition: Alb 3.0, continue protein supps. 7. Hx CVA: Per primary. 8. Type 2 DM: On insulin. Per primary. 9. Hx Bipolar: Per primary.   Veneta Penton, PA-C 07/25/2016, 10:37 AM  [facility] Pager: [phone]  Renal Attending: I agree with above note. She is much better with a lower EDW now. Anemia persists. OK for discharge after HD today. Shivonne Schwartzman C

## 2016-07-25 NOTE — Progress Notes (Signed)
PROGRESS NOTE    [name]  NWG:[id number] DOB: April 20, 1966 DOA: 07/21/2016 PCP: [name], MD  Brief Narrative: The patient is a 50 y.o. year-old with history of DM2, CVA, HTN, Jehovah's Witness, hepatitis C, combined CHF, bipolar d/o who presented to ED, 12/17 , she reports dark red blood per rectum Tuesday and smaller amounts there after. She also had some N/V for the last several days and due to this missed HD x 2.   CXR with pulm edema ,Hb down to 8.1->6.7 heme positive. Hypoxic getting extra HD and GI consulting  Assessment & Plan:   Lower GI bleed in the setting of  Iron deficiency anemia and Anemia of Chronic disease.  -hemoccult positive /Hb down to 6.3, reports dark red blood per rectum on Tuesday -refused blood transfusion-Jehovah's witness, explained risks -continue maximal IV Iron and Epo with HD -GI consulted, colonoscopy 2014 - with hemorrhoids only and EGD -2016 was normal  -possibly would be hemorrhoidal bleeding alone, although history not clearly suggestive of such -appreciate GI input, observe for now -Patient had one large BM today with streaks of red blood. We will recheck CBC. Patient is symptomatic with dizziness.  Anemia -due to CKD, and small component of blood loss and hemodilution from volume overload -monitor Hb, declines blood-discussed again  -continue maximal EPo and Iron with HD  Volume overload/ESRD on HD MWF TThS -with hypoxia -missed HD x2, extra HD per Renal, extra fluid removal, wean O2 down as tolerated  Chronic combined systolic and diastolic CHF -  last 2D echo in June 5784 showed Systolic function was mildly reduced.  45% to 50% Diffuse hypokinesis.  grade 2 DD - volume managed with HD  Nausea, vomiting diarrhea,  -could be  to viral gastroenteritis compounded by uremia and missed HD,  -resolved -no further N/V at this time  Type II Diabetes  With Neuropathy  -poor med compliance -last Hba1c 9.4  GERD, Continue  PPI  Hypertension  -Continue home anti-hypertensive medications  History of Bipolar disorder  -Continue her home meds   Right great toe wound,  -due to neuropathy and fracture noted per foot X ray. No osteomyelitis is suspected -Wound Care consulted  Pain control. Patient complains about generalized body ache and requesting her "home pain medication". On [place] control substances review, patient has never received any pain medication prescriptions and last 1 year. Currently we will use Tylenol only.  DVT prophylaxis:  SCD's    Code Status:   Full     Family Communication:  Discussed with patient Disposition Plan: home in 1-2days  Consultants:   [facility]  Subjective: Constipation resolved, had one large BM with some blood. Feeling dizzy after the BM.  Objective: Vitals:   07/25/16 1600 07/25/16 1630 07/25/16 1704 07/25/16 1825  BP: (!) 164/80 (!) 147/72 (!) 154/71 123/69  Pulse: 87 86 88 91  Resp: 14 15 16 17   Temp:   98 F (36.7 C) 98 F (36.7 C)  TempSrc:   Oral Oral  SpO2:   98% 99%  Weight:   64.5 kg (142 lb 3.2 oz)   Height:        Intake/Output Summary (Last 24 hours) at 07/25/16 1845 Last data filed at 07/25/16 1825  Gross per 24 hour  Intake              860 ml  Output             3625 ml  Net            -  2765 ml   Filed Weights   07/24/16 2049 07/25/16 1334 07/25/16 1704  Weight: 67.5 kg (148 lb 12.8 oz) 68.1 kg (150 lb 2.1 oz) 64.5 kg (142 lb 3.2 oz)    Examination:  General exam: Appears calm and comfortable, AAOx3, no distress Respiratory system: basilar crackles noted Cardiovascular system: S1 & S2 heard, RRR. No JVD, murmurs, rubs, gallops or clicks. No pedal edema. Gastrointestinal system: Abdomen is nondistended, soft and nontender. No organomegaly or masses felt. Normal bowel sounds heard. Central nervous system: Alert and oriented. No focal neurological deficits. Extremities: 2plus edema Skin: No rashes, lesions or  ulcers Psychiatry: Judgement and insight appear normal. Mood & affect appropriate.     Data Reviewed: I have personally reviewed following labs and imaging studies  CBC:  Recent Labs Lab 07/22/16 0614 07/23/16 0419 07/23/16 2104 07/24/16 1439 07/24/16 1903  WBC 4.0 4.1 6.0 4.0 5.5  HGB 6.7* 7.1* 8.0* 6.3* 7.7*  HCT 22.5* 23.3* 26.0* 20.7* 25.6*  MCV 98.3 98.7 98.5 98.6 98.5  PLT 155 177 242 173 413   Basic Metabolic Panel:  Recent Labs Lab 07/21/16 0915 07/22/16 0614 07/23/16 0419 07/23/16 2104 07/24/16 1439  NA 137 136 135 139 136  K 5.4* 4.8 3.8 3.8 3.8  CL 85* 90* 91* 95* 96*  CO2 33* 34* 32 32 33*  GLUCOSE 303* 124* 176* 147* 138*  BUN 44* 23* 15 7 10   CREATININE 9.23* 6.27* 4.60* 3.08* 3.28*  CALCIUM 8.5* 8.4* 8.5* 9.0 8.4*  PHOS  --   --   --  2.7 2.2*   GFR: Estimated Creatinine Clearance: 17.7 mL/min (by C-G formula based on SCr of 3.28 mg/dL (H)). Liver Function Tests:  Recent Labs Lab 07/21/16 0915 07/22/16 0614 07/23/16 2104 07/24/16 1439  AST 35 29  --   --   ALT 22 21  --   --   ALKPHOS 83 67  --   --   BILITOT 1.1 1.0  --   --   PROT 7.4 6.4*  --   --   ALBUMIN 3.4* 3.0* 3.3* 3.0*   No results for input(s): LIPASE, AMYLASE in the last 168 hours. No results for input(s): AMMONIA in the last 168 hours. Coagulation Profile:  Recent Labs Lab 07/21/16 0915  INR 1.35   Cardiac Enzymes: No results for input(s): CKTOTAL, CKMB, CKMBINDEX, TROPONINI in the last 168 hours. BNP (last 3 results) No results for input(s): PROBNP in the last 8760 hours. HbA1C: No results for input(s): HGBA1C in the last 72 hours. CBG:  Recent Labs Lab 07/24/16 0742 07/24/16 1136 07/24/16 1825 07/25/16 0813 07/25/16 1146  GLUCAP 220* 102* 101* 263* 124*   Lipid Profile: No results for input(s): CHOL, HDL, LDLCALC, TRIG, CHOLHDL, LDLDIRECT in the last 72 hours. Thyroid Function Tests: No results for input(s): TSH, T4TOTAL, FREET4, T3FREE, THYROIDAB in  the last 72 hours. Anemia Panel: No results for input(s): VITAMINB12, FOLATE, FERRITIN, TIBC, IRON, RETICCTPCT in the last 72 hours. Urine analysis:    Component Value Date/Time   COLORURINE YELLOW 09/17/2015 0108   APPEARANCEUR CLEAR 09/17/2015 0108   LABSPEC 1.013 09/17/2015 0108   PHURINE 8.0 09/17/2015 0108   GLUCOSEU NEGATIVE 09/17/2015 0108   HGBUR SMALL (A) 09/17/2015 0108   BILIRUBINUR NEGATIVE 09/17/2015 0108   BILIRUBINUR neg 12/13/2014 1127   KETONESUR NEGATIVE 09/17/2015 0108   PROTEINUR >300 (A) 09/17/2015 0108   UROBILINOGEN 0.2 05/01/2015 1252   NITRITE NEGATIVE 09/17/2015 0108   LEUKOCYTESUR NEGATIVE 09/17/2015 0108  Recent Results (from the past 240 hour(s))  MRSA PCR Screening     Status: None   Collection Time: 07/22/16  6:24 AM  Result Value Ref Range Status   MRSA by PCR NEGATIVE NEGATIVE Final    Comment:        The GeneXpert MRSA Assay (FDA approved for NASAL specimens only), is one component of a comprehensive MRSA colonization surveillance program. It is not intended to diagnose MRSA infection nor to guide or monitor treatment for MRSA infections.    Radiology Studies: No results found.   Scheduled Meds: . sodium chloride   Intravenous Once  . amLODipine  10 mg Oral QHS  . atorvastatin  40 mg Oral QHS  . calcium acetate  1,334 mg Oral TID WC  . carvedilol  3.125 mg Oral BID WC  . cloNIDine  0.1 mg Oral Q8H  . darbepoetin (ARANESP) injection - DIALYSIS  200 mcg Intravenous Q Mon-HD  . feeding supplement (PRO-STAT SUGAR FREE 64)  30 mL Oral BID  . folic acid  1 mg Oral BID WC  . insulin aspart  0-9 Units Subcutaneous TID WC  . insulin glargine  13 Units Subcutaneous QHS  . LORazepam  0.5 mg Oral Q T,Th,Sa-HD  . multivitamin   Oral QHS  . polyethylene glycol  17 g Oral Daily  . sevelamer carbonate  2,400 mg Oral TID WC  . silver sulfADIAZINE   Topical Daily  . sodium chloride flush  3 mL Intravenous Q12H   Continuous Infusions:    LOS: 3 days    Time spent: 16min  Author:  Berle Mull, MD [facility] Pager: [phone] 07/25/2016 6:45 PM    If 7PM-7AM, please contact night-coverage [email or web address] Password [facility] 07/25/2016, 6:45 PM

## 2016-07-25 NOTE — Care Management Note (Signed)
Case Management Note  Patient Details  Name: [name] MRN: [id number] Date of Birth: 06/11/1966  Subjective/Objective:            CM following for progression and d/c planning.         Action/Plan: 07/25/2016 Met with pt re HHPT, pt agrees that she would benefit from HHPT, pt selected [facility] for HHPT. [facility] notified and given pt phone number [phone]. [facility] informed that pt has HD on TTS, and made aware that pt son has pit bull in the house.   Expected Discharge Date:       07/25/2016           Expected Discharge Plan:  Home w Home Health Services  In-House Referral:  NA  Discharge planning Services  CM Consult  Post Acute Care Choice:  Home Health Choice offered to:     DME Arranged:  N/A DME Agency:  NA  HH Arranged:  PT HH Agency:  [facility]  Status of Service:  Completed, signed off  If discussed at Long Length of Stay Meetings, dates discussed:    Additional Comments:  ,  U, RN 07/25/2016, 11:02 AM  

## 2016-07-25 NOTE — Progress Notes (Addendum)
PT Cancellation Note  Patient Details Name: [name] MRN: [id number] DOB: [birth year]   Cancelled Treatment:    Reason Eval/Treat Not Completed: PT screened, no needs identified, will sign off.  Over the last few days, pt has had ample chances to work with PT, but politely refused when PT available then was later not available due to in HD.  Pt to d/c today.  Case management stated could order HHPT without PT in this instance.  MD aware.  I believe pt could benefit from short term HHPT.  Will screen out and sign off. 07/25/2016  Donnella Sham, PT [phone]  (pager)   [name] 07/25/2016, 6:49 PM

## 2016-07-25 NOTE — Consult Note (Signed)
[name] CM Inpatient Consult   07/25/2016  [name] 1965/08/11 [id number]   Referral received from inpatient for follow up.  Patient was assessed for [facility] for community services for restart of services.   Patient was previously active with [facility] with nursing, pharmacy,and social work. Chart reveals per HPI: [name] is a 50 y.o. female with very extensive medical history listed below brought by EMS due to nausea, vomiting and diarrhea,  over the last 2 days prior to admission,  with intermittent blogging her stools. No apparent sick contacts. She denies any food poisoning. She does complain of cramp. Some of her stools are bloody. Patient has a HX of Diabetes II, CVA, HTN, Jehovah Witness, HEP C, with HF.  H/O Bipolar D/O. Patient has not been compliant with her meds and has not been attending to her HD sessions due to the above complaints.   Met with patient at bedside regarding being restarted with [facility] services.  Patient states she was trying manage at home and states, "I think I waited too long before asking for help."  Explained that [facility] had some difficulty maintaining contact with her.  She states her current phone number is [phone].  Consent form signed is on file and brochure with [facility] information given.  Patient states her dialysis schedule is T, TH  Sat.  Of note, [facility] Care Management services does not replace or interfere with any services that are arranged by inpatient case management or social work. For additional questions or referrals please contact:  Natividad Brood, RN BSN [facility] Liaison  [phone] business mobile phone Toll free office [phone]

## 2016-07-26 DIAGNOSIS — K625 Hemorrhage of anus and rectum: Secondary | ICD-10-CM

## 2016-07-26 LAB — CBC
HEMATOCRIT: 26.3 % — AB (ref 36.0–46.0)
Hemoglobin: 7.9 g/dL — ABNORMAL LOW (ref 12.0–15.0)
MCH: 30.2 pg (ref 26.0–34.0)
MCHC: 30 g/dL (ref 30.0–36.0)
MCV: 100.4 fL — AB (ref 78.0–100.0)
PLATELETS: 237 10*3/uL (ref 150–400)
RBC: 2.62 MIL/uL — ABNORMAL LOW (ref 3.87–5.11)
RDW: 17 % — AB (ref 11.5–15.5)
WBC: 5.9 10*3/uL (ref 4.0–10.5)

## 2016-07-26 LAB — GLUCOSE, CAPILLARY
Glucose-Capillary: 244 mg/dL — ABNORMAL HIGH (ref 65–99)
Glucose-Capillary: 258 mg/dL — ABNORMAL HIGH (ref 65–99)

## 2016-07-26 MED ORDER — HYDRALAZINE HCL 20 MG/ML IJ SOLN: 5.0000 mg | Freq: Once | INTRAMUSCULAR | Status: DC

## 2016-07-26 NOTE — Final Progress Note (Signed)
Patient discharge teaching given, including activity, diet, follow-up appoints, and medications. Patient verbalized understanding of all discharge instructions. IV access was d/c'd. Vitals are stable. Skin is intact except as charted in most recent assessments. Pt to be escorted out by volunteer, to be driven home by a taxi.

## 2016-07-26 NOTE — Clinical Social Work Note (Signed)
CSW provided cab voucher home, a coat, and shoes.  CSW signing off. Consult again if any social work needs arise.  Dayton Scrape, [place]

## 2016-07-26 NOTE — Progress Notes (Signed)
Patient's BP 176/76. On call NP, Baltazar Najjar, notified. RN awaiting orders.  Patient also took cardiac monitor off and refuses to put back on. On call NP, Baltazar Najjar, notified. RN will continue to monitor patient.  Ermalinda Memos, RN

## 2016-07-26 NOTE — Progress Notes (Signed)
[facility] Progress Note   Subjective: Up in room getting dressed. Said she was supposed to go home yesterday but her family wouldn't come get her. Upset because she is still having dark stools, doesn't feel like she's being treated appropriately. Still C/O abdominal bloating.   Objective Vitals:   07/25/16 1825 07/25/16 2053 07/26/16 0600 07/26/16 0832  BP: 123/69 (!) 167/81 (!) 176/76 (!) 138/55  Pulse: 91 94 88 90  Resp: 17 18 19 18   Temp: 98 F (36.7 C) 99.1 F (37.3 C) 99.4 F (37.4 C) 99.3 F (37.4 C)  TempSrc: Oral Oral Oral Oral  SpO2: 99% 100% 100% 100%  Weight:  64.4 kg (141 lb 15.6 oz)    Height:       Physical Exam General: Appears anxious, up in room. NAD Heart: S1,S2, RRR No M/G/R Lungs: BBS CTAB A/P Abdomen: slightly protuberant, soft, active BS Extremities: No LE edema Dialysis Access: LFA AVF + bruit  Additional Objective Labs: Basic Metabolic Panel:  Recent Labs Lab 07/23/16 0419 07/23/16 2104 07/24/16 1439  NA 135 139 136  K 3.8 3.8 3.8  CL 91* 95* 96*  CO2 32 32 33*  GLUCOSE 176* 147* 138*  BUN 15 7 10   CREATININE 4.60* 3.08* 3.28*  CALCIUM 8.5* 9.0 8.4*  PHOS  --  2.7 2.2*   Liver Function Tests:  Recent Labs Lab 07/21/16 0915 07/22/16 0614 07/23/16 2104 07/24/16 1439  AST 35 29  --   --   ALT 22 21  --   --   ALKPHOS 83 67  --   --   BILITOT 1.1 1.0  --   --   PROT 7.4 6.4*  --   --   ALBUMIN 3.4* 3.0* 3.3* 3.0*   No results for input(s): LIPASE, AMYLASE in the last 168 hours. CBC:  Recent Labs Lab 07/23/16 0419 07/23/16 2104 07/24/16 1439 07/24/16 1903 07/26/16 0606  WBC 4.1 6.0 4.0 5.5 5.9  HGB 7.1* 8.0* 6.3* 7.7* 7.9*  HCT 23.3* 26.0* 20.7* 25.6* 26.3*  MCV 98.7 98.5 98.6 98.5 100.4*  PLT 177 242 173 253 237   Blood Culture    Component Value Date/Time   SDES NASAL SWAB 03/03/2016 1728   SPECREQUEST NONE 03/03/2016 1728   CULT NO STAPHYLOCOCCUS AUREUS ISOLATED 03/03/2016 1728   REPTSTATUS  03/05/2016 FINAL 03/03/2016 1728    Cardiac Enzymes: No results for input(s): CKTOTAL, CKMB, CKMBINDEX, TROPONINI in the last 168 hours. CBG:  Recent Labs Lab 07/25/16 0813 07/25/16 1146 07/25/16 1843 07/25/16 2051 07/26/16 0753  GLUCAP 263* 124* 123* 197* 244*   Iron Studies: No results for input(s): IRON, TIBC, TRANSFERRIN, FERRITIN in the last 72 hours. @lablastinr3 @ Studies/Results: No results found. Medications:  . sodium chloride   Intravenous Once  . amLODipine  10 mg Oral QHS  . atorvastatin  40 mg Oral QHS  . calcium acetate  1,334 mg Oral TID WC  . carvedilol  3.125 mg Oral BID WC  . cloNIDine  0.1 mg Oral Q8H  . darbepoetin (ARANESP) injection - DIALYSIS  200 mcg Intravenous Q Mon-HD  . feeding supplement (PRO-STAT SUGAR FREE 64)  30 mL Oral BID  . folic acid  1 mg Oral BID WC  . insulin aspart  0-9 Units Subcutaneous TID WC  . insulin glargine  13 Units Subcutaneous QHS  . LORazepam  0.5 mg Oral Q T,Th,Sa-HD  . multivitamin   Oral QHS  . polyethylene glycol  17 g Oral Daily  .  sevelamer carbonate  2,400 mg Oral TID WC  . silver sulfADIAZINE   Topical Daily  . sodium chloride flush  3 mL Intravenous Q12H    Dialysis Orders: TTS at [facility], 3:30hours, 72kg (will be much lower on d/c), 2K/2.25Ca bath, No heparin, L AVG  Assessment/Plan: 1. Dyspnea/Pulmonary edema: Admission CXR with diffuse edema and LLL opacity. Improving with serial HD sessions. Says she is going home-no DC note in chart.  2. ESRD: Usually TTS schedule, daily HD until volume corrected. Next HD tomorrow on schedule at center if Dc'd home.  3. HTN/volume: See above. BP high with volume excess. Will need EDW lowered on d/c. 4. Anemia/?LGIB: Hgb 6.7with + guaiacs on admit, 7.9 today. GI consulted, no plan for scope. Refusing blood products due to Jehovah's Witness status. Getting course of IV iron and given Aranesp 12/18, knows that will not correct anemia immediately. 5.  Secondary hyperparathyroidism: Ca/Phosok. Continue Renvela with meals. 6. Nutrition: Alb 3.0, continue protein supps. 7. Hx CVA: Per primary. 8. Type 2 DM: On insulin. Per primary. 9. Hx Bipolar: Per primary.  Disposition: Hgb improving. Stable for DC.   Rita H. Brown NP-C 07/26/2016, 9:07 AM  [facility] [phone]  Renal Attending: She is better volume wise and will be discharged with a lower EDW.  I have encouraged her to attend txs and stay on machine.  Ronnald Shedden C

## 2016-07-26 NOTE — Care Management Important Message (Signed)
Important Message  Patient Details  Name: [name] MRN: [id number] Date of Birth: [birth year]   Medicare Important Message Given:  Yes    [name] [address] 07/26/2016, 10:39 AM

## 2016-07-29 NOTE — Discharge Summary (Signed)
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 Follow-up with PCP in one week with CBC. 2. Establish With GI   Follow-up Information    WEBB, CAROL D, MD. Schedule an appointment as soon as possible for a visit in 1 week(s).   Specialty:  Family Medicine Why:  Appointment Date:  Patient was dismissed from office on 04/25/2016, but can be reinstated for future appointments if she calls office to speak with supervisor. Thank you.  Contact information: [place] [address] [phone]        [facility]. Call in 2 month(s).   Specialty:  Gastroenterology Why:  as needed and to establish care, seen by Dr Ardis Hughs in hospital.  Contact information: [place] [address] [phone]         Diet recommendation: Renal diet  Activity: The patient is advised to gradually reintroduce usual activities.  Discharge Condition: good  Code Status: Full code  History of present illness: As per the H and P dictated on  admission, "[name] is a 50 y.o. female with very extensive medical history listed below brought by EMS due to nausea, vomiting and diarrhea,  over the last 2 days with intermittent blogging her stools. No apparent sick contacts. She denies any food poisoning. She does complain of cramp. Some of her stools are bloody. Of note, she was to see a G.I. physician next week for evaluation of similar G.I. symptoms. She denies any fever chills or night sweats. She denies any dizziness or vertigo. She does have neuropathy, and she has an open wound on her right great toe after hitting her heater yesterday due to lack of sensation in her toes. Patient has not been compliant with her meds and has not been attending to her HD sessions due to the above complaints.  "  Hospital Course:  Summary of her active problems in the hospital is as following. Lower GI bleed in the setting of Iron deficiency anemia and Anemia of Chronic disease. -hemoccult positive /Hb down to 6.3, reports dark red blood per rectum on Tuesday -refused blood transfusion-Jehovah's witness, explained risks -continue maximal IV Iron and Epo with HD -GI consulted, colonoscopy 2014 - with hemorrhoids only and EGD -2016 was normal  -possibly would be hemorrhoidal bleeding alone, although history not clearly suggestive of such -appreciate GI input, no inpatient procedure.  Anemia -due to CKD, and small component of blood loss and hemodilution from volume overload -monitor Hb, declines blood-discussed again  -continue maximal EPo and Iron with HD  Volume overload/ESRD on HD MWF TThS -with hypoxia -missed HD x2, extra HD per Renal, extra fluid removal,  Resolved  Chronic combined systolic and diastolic CHF - last 2D echo in June 4627 showed Systolic function was mildly reduced. 45% to 50% Diffuse hypokinesis. grade 2 DD - volume managed with HD  Nausea, vomiting diarrhea,  -could be  to viral gastroenteritis compounded by  uremia and missed HD,  -resolved -no further N/V at this time  Type II Diabetes With Neuropathy  -poor med compliance -last Hba1c 9.4  GERD, Continue PPI  Hypertension  -Continue home anti-hypertensive medications  History of Bipolar disorder  -Continue her home meds   Right great toe wound,  -due to neuropathy and fracture noted per foot X ray. No osteomyelitis is suspected -Wound Care consulted  Pain control. Patient complains about generalized body ache and requesting her "home pain medication". On [place] control substances review, patient has never received any pain medication prescriptions and last 1 year. Currently we will use Tylenol only    All other chronic medical condition were stable during the hospitalization.  Patient was seen by physical therapy, who recommended home health, which was arranged by Education officer, museum and case Freight forwarder. On the day of the discharge the patient's vitals are stable, and no other acute medical condition were reported by patient. the patient was felt safe to be discharge at home with home health.  Procedures and Results:  ESRD HD   Consultations:  Nephrology  Gastroenterology  DISCHARGE MEDICATION: Discharge Medication List as of 07/26/2016  1:02 PM    START taking these medications   Details  Amino Acids-Protein Hydrolys (FEEDING SUPPLEMENT, PRO-STAT SUGAR FREE 64,) LIQD Take 30 mLs by mouth 2 (two) times daily., Starting Wed 07/24/2016, Normal    calcium acetate (PHOSLO) 667 MG capsule Take 2 capsules (1,334 mg total) by mouth 3 (three) times daily with meals., Starting Wed 03/50/0938, Normal    folic acid (FOLVITE) 1 MG tablet Take 1 tablet (1 mg total) by mouth 2 (two) times daily with a meal., Starting Thu 07/25/2016, Normal    senna-docusate (SENOKOT-S) 8.6-50 MG tablet Take 1 tablet by mouth 2 (two) times daily., Starting Wed 07/24/2016, Normal      CONTINUE these medications which have CHANGED   Details    polyethylene glycol (MIRALAX / GLYCOLAX) packet Take 17 g by mouth daily., Starting Wed 07/24/2016, Normal      CONTINUE these medications which have NOT CHANGED   Details  amLODipine (NORVASC) 10 MG tablet Take 1 tablet (10 mg total) by mouth at bedtime., Starting Tue 01/16/2016, Normal    atorvastatin (LIPITOR) 40 MG tablet Take 1 tablet (40 mg total) by mouth at bedtime., Starting Tue 01/16/2016, Normal    B Complex-C-Folic Acid (RENAL VITAMIN PO) Take 1 tablet by mouth at bedtime., Historical Med    carvedilol (COREG) 3.125 MG tablet Take 1 tablet (3.125 mg total) by mouth 2 (two) times daily with a meal. Hold if HR <60, Starting Tue 01/16/2016, Normal    cloNIDine (CATAPRES) 0.1 MG tablet Take 1 tablet (0.1 mg total) by mouth every 8 (eight) hours., Starting Tue 01/16/2016, Normal    diphenhydrAMINE (BENADRYL) 25 mg capsule Take 1 capsule (25 mg total) by mouth every 6 (six) hours as needed for itching., Starting Sun 05/07/2015, Print    insulin aspart (NOVOLOG) 100 UNIT/ML injection Inject 0-12 Units into the skin 3 (three) times daily before meals. Per sliding scale inject SQ <150=0units, 151-200=2 units, 201-250=4units, 251-300=6units,301-350=8  units, 351-400=10 units, >/= 401=12 units, Starting Tue 01/16/2016, Print    insulin glargine (LANTUS) 100 UNIT/ML injection Inject 0.25 mLs (25 Units total) into the skin at bedtime., Starting Tue 03/05/2016, No Print    lidocaine-prilocaine (EMLA) cream Apply 1 application topically See admin instructions. Apply to dialysis site one hour prior to dialysis, Historical Med    LORazepam (ATIVAN) 0.5 MG tablet Take 0.5 mg by mouth See admin instructions. Take 1 tablet (0.5 mg) by mouth prior to dialysis and at bedtime as needed for sleep, Historical Med    polyvinyl alcohol (LIQUIFILM TEARS) 1.4 % ophthalmic solution Place 2 drops into both eyes every 6 (six) hours as needed for dry eyes. , Historical Med    sevelamer carbonate (RENVELA) 800 MG  tablet Take 3 tablets (2,400 mg total) by mouth 3 (three) times daily with meals., Starting Tue 01/16/2016, Normal    acetaminophen (TYLENOL) 325 MG tablet Take 2 tablets (650 mg total) by mouth every 6 (six) hours as needed for mild pain (or Fever >/= 101)., Starting Wed 09/20/2015, No Print       Allergies  Allergen Reactions  . Penicillins Hives and Other (See Comments)    Has patient had a PCN reaction causing immediate rash, facial/tongue/throat swelling, SOB or lightheadedness with hypotension: Yes Has patient had a PCN reaction causing severe rash involving mucus membranes or skin necrosis: No Has patient had a PCN reaction that required hospitalization No Has patient had a PCN reaction occurring within the last 10 years: No If all of the above answers are "NO", then may proceed with Cephalosporin use. Pt tolerates cefepime and ceftriaxone  . Gabapentin Itching and Swelling  . Lyrica [Pregabalin] Swelling and Other (See Comments)    Facial and leg swelling  . Saphris [Asenapine] Swelling and Other (See Comments)    Facial swelling  . Tramadol Swelling and Other (See Comments)    Leg swelling  . Other Other (See Comments)    NO BLOOD PRODUCTS. PT IS A JEHOVAH WITNESS   Discharge Instructions    AMB Referral to [facility] Care Management    Complete by:  As directed    Reason for consult:  Post hospital monitoring  Patient has HD on T, TH, Sat   Diagnoses of:   Kidney Failure Heart Failure     Expected date of contact:  1-3 days (reserved for hospital discharges)   Please assign the patient to telephonic nurse for [facility], patient's son has a bulldog, that can't be around people.  Restart services for post hospital follow up.  Her phone number is: [phone].  Active consent on file.   Ambulatory referral to Gastroenterology    Complete by:  As directed    Diet renal [id number]    Complete by:  As directed    Discharge instructions    Complete by:  As directed    It is important  that you read following instructions as well as go over your medication list with RN to help you understand your care after this hospitalization.  Discharge Instructions: Please follow-up with PCP in one week  Please request your primary care physician to go over all Hospital Tests and Procedure/Radiological results at the follow up,  Please get all Hospital records sent to your PCP by signing hospital release before you go home.   Do not take more than prescribed Pain, Sleep and Anxiety Medications. You were cared for by a hospitalist during your hospital stay. If you have any questions  about your discharge medications or the care you received while you were in the hospital after you are discharged, you can call the unit and ask to speak with the hospitalist on call if the hospitalist that took care of you is not available.  Once you are discharged, your primary care physician will handle any further medical issues. Please note that NO REFILLS for any discharge medications will be authorized once you are discharged, as it is imperative that you return to your primary care physician (or establish a relationship with a primary care physician if you do not have one) for your aftercare needs so that they can reassess your need for medications and monitor your lab values. You Must read complete instructions/literature along with all the possible adverse reactions/side effects for all the Medicines you take and that have been prescribed to you. Take any new Medicines after you have completely understood and accept all the possible adverse reactions/side effects. Wear Seat belts while driving. If you have smoked or chewed Tobacco in the last 2 yrs please stop smoking and/or stop any Recreational drug use.   Increase activity slowly    Complete by:  As directed      Discharge Exam: Filed Weights   07/25/16 1334 07/25/16 1704 07/25/16 2053  Weight: 68.1 kg (150 lb 2.1 oz) 64.5 kg (142 lb 3.2 oz) 64.4  kg (141 lb 15.6 oz)   Vitals:   07/26/16 0600 07/26/16 0832  BP: (!) 176/76 (!) 138/55  Pulse: 88 90  Resp: 19 18  Temp: 99.4 F (37.4 C) 99.3 F (37.4 C)   General: Appear in no distress, no Rash; Oral Mucosa moist. Cardiovascular: S1 and S2 Present, no Murmur, no JVD Respiratory: Bilateral Air entry present and Clear to Auscultation, no Crackles, no wheezes Abdomen: Bowel Sound present, Soft and no tenderness Extremities: no Pedal edema, no calf tenderness Neurology: Grossly no focal neuro deficit.  The results of significant diagnostics from this hospitalization (including imaging, microbiology, ancillary and laboratory) are listed below for reference.    Significant Diagnostic Studies: X-ray Chest Pa And Lateral  Result Date: 07/21/2016 CLINICAL DATA:  Pt reports cough x 3 days and she missed her dialysis treatment; h/o DM, HTN, and TIA; former smoker EXAM: CHEST  2 VIEW COMPARISON:  07/21/2016 FINDINGS: Heart size is enlarged. There are diffuse bilateral airspace filling opacities with a perihilar distribution. Opacification at the left lung base obscures the left hemidiaphragm. There are bilateral pleural effusions. IMPRESSION: 1. Findings consistent with CHF. 2. Consolidation at the left lung base is suspicious for infectious process. Electronically Signed   By: Nolon Nations M.D.   On: 07/21/2016 13:39   Dg Chest Portable 1 View  Result Date: 07/21/2016 CLINICAL DATA:  Cough, volume overload EXAM: PORTABLE CHEST 1 VIEW COMPARISON:  03/03/2016 FINDINGS: Cardiomegaly with vascular congestion. Left lower lobe atelectasis or infiltrate with left effusion. Suspect mild pulmonary edema. No effusion on the right. IMPRESSION: Cardiomegaly with vascular congestion. Suspect mild pulmonary edema. Probable small left pleural effusion with left lower lobe atelectasis or infiltrate. Electronically Signed   By: Rolm Baptise M.D.   On: 07/21/2016 10:05   Dg Toe Great Right  Result Date:  07/21/2016 CLINICAL DATA:  Right toe pain, injury last week. EXAM: RIGHT GREAT TOE COMPARISON:  None FINDINGS: Nondisplaced fracture through the distal phalanx of the right great toe. No additional acute bony abnormality. No subluxation or dislocation. Soft tissues are intact. IMPRESSION: Nondisplaced fracture through the distal phalanx of the  right great toe. Electronically Signed   By: Rolm Baptise M.D.   On: 07/21/2016 10:07    Microbiology: Recent Results (from the past 240 hour(s))  MRSA PCR Screening     Status: None   Collection Time: 07/22/16  6:24 AM  Result Value Ref Range Status   MRSA by PCR NEGATIVE NEGATIVE Final    Comment:        The GeneXpert MRSA Assay (FDA approved for NASAL specimens only), is one component of a comprehensive MRSA colonization surveillance program. It is not intended to diagnose MRSA infection nor to guide or monitor treatment for MRSA infections.      Labs: CBC:  Recent Labs Lab 07/23/16 0419 07/23/16 2104 07/24/16 1439 07/24/16 1903 07/26/16 0606  WBC 4.1 6.0 4.0 5.5 5.9  HGB 7.1* 8.0* 6.3* 7.7* 7.9*  HCT 23.3* 26.0* 20.7* 25.6* 26.3*  MCV 98.7 98.5 98.6 98.5 100.4*  PLT 177 242 173 253 592   Basic Metabolic Panel:  Recent Labs Lab 07/23/16 0419 07/23/16 2104 07/24/16 1439  NA 135 139 136  K 3.8 3.8 3.8  CL 91* 95* 96*  CO2 32 32 33*  GLUCOSE 176* 147* 138*  BUN 15 7 10   CREATININE 4.60* 3.08* 3.28*  CALCIUM 8.5* 9.0 8.4*  PHOS  --  2.7 2.2*   Liver Function Tests:  Recent Labs Lab 07/23/16 2104 07/24/16 1439  ALBUMIN 3.3* 3.0*   No results for input(s): LIPASE, AMYLASE in the last 168 hours. No results for input(s): AMMONIA in the last 168 hours. Cardiac Enzymes: No results for input(s): CKTOTAL, CKMB, CKMBINDEX, TROPONINI in the last 168 hours. BNP (last 3 results)  Recent Labs  03/03/16 1228  BNP 1,298.5*   CBG:  Recent Labs Lab 07/25/16 1146 07/25/16 1843 07/25/16 2051 07/26/16 0753  07/26/16 1214  GLUCAP 124* 123* 197* 244* 258*   Time spent: 30 minutes  Signed:  Fredy Gladu  [facility] 07/26/2016 , 9:09 AM

## 2016-07-31 ENCOUNTER — Other Ambulatory Visit: Payer: Self-pay

## 2016-07-31 NOTE — Patient Outreach (Signed)
[facility]) Care Management  07/31/2016  [name] [birth year] [id number]     Transition of Care Referral  Referral Date: 07/25/16 Referral Source: [facility] hospital liaison Date of Discharge: 07/26/16 Facility: [place]: Medicare and Medicaid   Outreach attempt # 1 to patient. Spoke with patient who stated she was still asleep and requested to be contacted later in the day(after 2pm).    Plan: RN CM will make outreach attempt to patient within one business day.    Enzo Montgomery, RN,BSN,CCM [place] Management Telephonic Care Management Coordinator Direct Phone: [phone] Toll Free: [phone] Fax: [phone]

## 2016-07-31 NOTE — Patient Outreach (Addendum)
[facility]) Care Management  07/31/2016  [name] 08/14/1965 [id number]   Transition of Care Referral  Referral Date: 07/25/16 Referral Source: [facility] hospital liaison Date of Discharge: 07/26/16 Facility: [place]: Medicare and Medicaid    Outreach attempt to patient again per her previous request. Spoke with patient and reviewed [facility] services and discussed TOC program. Advised patient that she had spoken with [facility] hospital liaison while in the hospital and agreed to services.  Patient reported that she had "too much stuff going on right now" and shares how she is in the process of moving out of her son's place. She states she has too much to deal with and does not desire [facility] services at this time. She voices that maybe "once things settle and calm down" she may be agreeable to [facility] services. She was agreeable to RN CM mailing her [facility] brochure and magnet for future reference.    Plan: RN CM will notify [facility] administrative assistant of case status. RN CM unable to send MD case closure letter as patient has no PCP listed in system.   Enzo Montgomery, RN,BSN,CCM [place] Management Telephonic Care Management Coordinator Direct Phone: [phone] Toll Free: [phone] Fax: [phone]

## 2016-08-10 IMAGING — NM NM HEPATO W/GB/PHARM/[PERSON_NAME]
2 series · 12 of 12 positions shown · non-contrast
Comparison: 05/01/2015

CLINICAL DATA: Right upper quadrant pain, cholelithiasis

EXAM:
NUCLEAR MEDICINE HEPATOBILIARY IMAGING
TECHNIQUE: Sequential images of the abdomen were obtained [DATE] minutes
following intravenous administration of radiopharmaceutical.
RADIOPHARMACEUTICALS:  Five mCi Rc-IIm  Choletec IV

[he hepatobiliary · 3.43mm/px · 6 of 60 frames shown (1 of 2)]
[frame 6/60]
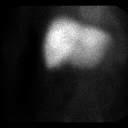
[frame 16/60]
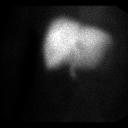
[frame 26/60]
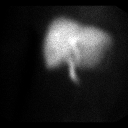
[frame 36/60]
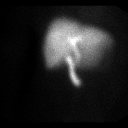
[frame 46/60]
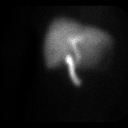
[frame 56/60]
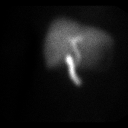

[he hepatobiliary · 3.43mm/px · 6 of 54 frames shown (2 of 2)]
[frame 5/54]
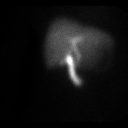
[frame 14/54]
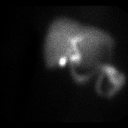
[frame 23/54]
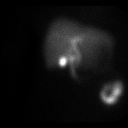
[frame 32/54]
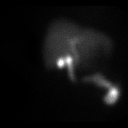
[frame 41/54]
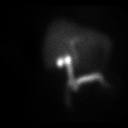
[frame 50/54]
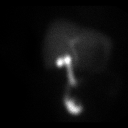

[12 of 12 positions shown; findings below may reference images not displayed]

FINDINGS: There is normal uptake throughout the liver immediately following
injection. Visualization of the biliary tree is noted at 20 minutes
with common bile duct activity at 20 minutes as well. Small bowel
activity is noted at 25 minutes. The gallbladder is visualized at 70
minutes with progressive filling over the next 45 minutes.
IMPRESSION: Normal uptake and excretion of biliary tracer.

Right upper Select proper NMHepatobiliary template.

## 2016-08-11 IMAGING — US IR FLUORO GUIDE CV LINE*R*
1 series · 1 of 1 positions shown · non-contrast
Comparison: none

INDICATION: 48-year-old with end-stage renal disease and starting hemodialysis.

[Series 1: ir fluoro/shunt/fist · 1 of 1 slices shown]
[im 1/1]
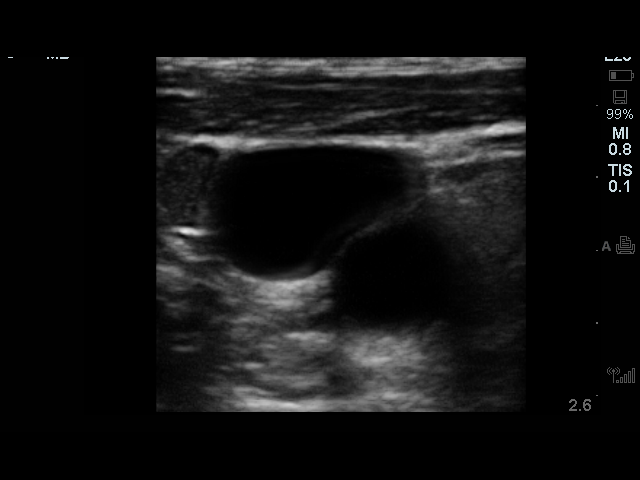

[1 of 1 positions shown; findings below may reference images not displayed]

EXAM:
FLUOROSCOPIC AND ULTRASOUND GUIDED PLACEMENT OF A TUNNELED DIALYSIS
CATHETER

FLUOROSCOPY TIME:  30 seconds, 11.3 mGy

MEDICATIONS:
1 mg Versed, 25 mcg fentanyl.  1 g vancomycin.

A radiology nurse monitored the patient for moderate sedation.

Vancomycin was given within two hours of incision. Vancomycin was
given due to a penicillin allergy.

ANESTHESIA/SEDATION:
Moderate sedation time: 30 minutes

PROCEDURE:
Informed consent was obtained for placement of a tunneled dialysis
catheter. The patient was placed supine on the interventional table.
Ultrasound confirmed a patent right internal jugularvein. Ultrasound
images were obtained for documentation. The right side of the neck
was prepped and draped in a sterile fashion. The right side of the
neck neck was anesthetized with 1% lidocaine. Maximal barrier
sterile technique was utilized including caps, mask, sterile gowns,
sterile gloves, sterile drape, hand hygiene and skin antiseptic. A
small incision was made with #11 blade scalpel. A 21 gauge needle
directed into the right internal jugular vein with ultrasound
guidance. A micropuncture dilator set was placed. A 19 cm tip to
cuff Palindrome catheter was selected. The skin below the right
clavicle was anesthetized and a small incision was made with an #11
blade scalpel. A subcutaneous tunnel was formed to the vein
dermatotomy site. The catheter was brought through the tunnel. The
vein dermatotomy site was dilated to accommodate a peel-away sheath.
The catheter was placed through the peel-away sheath and directed
into the central venous structures. The tip of the catheter was
placed in the right atrium with fluoroscopy. Fluoroscopic images
were obtained for documentation. Both lumens were found to aspirate
and flush well. The proper amount of heparin was flushed in both
lumens. The vein dermatotomy site was closed using a single layer of
absorbable suture and Dermabond. Gel-Foam was placed in the
subcutaneous tract. The catheter was secured to the skin using
Prolene suture.
FINDINGS: Catheter tip in the right atrium.

Estimated blood loss: Minimal

COMPLICATIONS:
None
IMPRESSION: Successful placement of a right jugular tunneled dialysis catheter
using ultrasound and fluoroscopic guidance.

## 2016-08-15 ENCOUNTER — Emergency Department (HOSPITAL_COMMUNITY): Payer: Medicare Other

## 2016-08-15 ENCOUNTER — Emergency Department (HOSPITAL_COMMUNITY)
Admission: EM | Admit: 2016-08-15 | Discharge: 2016-08-16 | Disposition: A | Discharge status: Home / Self Care | Payer: Medicare Other | Attending: Emergency Medicine | Admitting: Emergency Medicine

## 2016-08-15 DIAGNOSIS — N186 End stage renal disease: Secondary | ICD-10-CM | POA: Insufficient documentation

## 2016-08-15 DIAGNOSIS — S92413A Displaced fracture of proximal phalanx of unspecified great toe, initial encounter for closed fracture: Secondary | ICD-10-CM

## 2016-08-15 DIAGNOSIS — Y939 Activity, unspecified: Secondary | ICD-10-CM | POA: Insufficient documentation

## 2016-08-15 DIAGNOSIS — S99921A Unspecified injury of right foot, initial encounter: Secondary | ICD-10-CM | POA: Diagnosis present

## 2016-08-15 DIAGNOSIS — R51 Headache: Secondary | ICD-10-CM | POA: Insufficient documentation

## 2016-08-15 DIAGNOSIS — Y929 Unspecified place or not applicable: Secondary | ICD-10-CM | POA: Insufficient documentation

## 2016-08-15 DIAGNOSIS — X58XXXA Exposure to other specified factors, initial encounter: Secondary | ICD-10-CM | POA: Diagnosis not present

## 2016-08-15 DIAGNOSIS — Z992 Dependence on renal dialysis: Secondary | ICD-10-CM | POA: Insufficient documentation

## 2016-08-15 DIAGNOSIS — E1140 Type 2 diabetes mellitus with diabetic neuropathy, unspecified: Secondary | ICD-10-CM | POA: Insufficient documentation

## 2016-08-15 DIAGNOSIS — Z8673 Personal history of transient ischemic attack (TIA), and cerebral infarction without residual deficits: Secondary | ICD-10-CM | POA: Insufficient documentation

## 2016-08-15 DIAGNOSIS — E1122 Type 2 diabetes mellitus with diabetic chronic kidney disease: Secondary | ICD-10-CM | POA: Diagnosis not present

## 2016-08-15 DIAGNOSIS — R21 Rash and other nonspecific skin eruption: Secondary | ICD-10-CM | POA: Diagnosis not present

## 2016-08-15 DIAGNOSIS — S92411A Displaced fracture of proximal phalanx of right great toe, initial encounter for closed fracture: Secondary | ICD-10-CM | POA: Insufficient documentation

## 2016-08-15 DIAGNOSIS — I5042 Chronic combined systolic (congestive) and diastolic (congestive) heart failure: Secondary | ICD-10-CM | POA: Insufficient documentation

## 2016-08-15 DIAGNOSIS — Y999 Unspecified external cause status: Secondary | ICD-10-CM | POA: Insufficient documentation

## 2016-08-15 DIAGNOSIS — I132 Hypertensive heart and chronic kidney disease with heart failure and with stage 5 chronic kidney disease, or end stage renal disease: Secondary | ICD-10-CM | POA: Insufficient documentation

## 2016-08-15 DIAGNOSIS — Z794 Long term (current) use of insulin: Secondary | ICD-10-CM | POA: Diagnosis not present

## 2016-08-15 LAB — COMPREHENSIVE METABOLIC PANEL
ALK PHOS: 110 U/L (ref 38–126)
ALT: 29 U/L (ref 14–54)
AST: 34 U/L (ref 15–41)
Albumin: 3.7 g/dL (ref 3.5–5.0)
Anion gap: 14 (ref 5–15)
BILIRUBIN TOTAL: 1.3 mg/dL — AB (ref 0.3–1.2)
BUN: 22 mg/dL — ABNORMAL HIGH (ref 6–20)
CALCIUM: 8.7 mg/dL — AB (ref 8.9–10.3)
CO2: 26 mmol/L (ref 22–32)
Chloride: 95 mmol/L — ABNORMAL LOW (ref 101–111)
Creatinine, Ser: 4.99 mg/dL — ABNORMAL HIGH (ref 0.44–1.00)
GFR, EST AFRICAN AMERICAN: 11 mL/min — AB (ref >60)
GFR, EST NON AFRICAN AMERICAN: 9 mL/min — AB (ref >60)
GLUCOSE: 118 mg/dL — AB (ref 65–99)
Potassium: 3.8 mmol/L (ref 3.5–5.1)
Sodium: 135 mmol/L (ref 135–145)
TOTAL PROTEIN: 7.8 g/dL (ref 6.5–8.1)

## 2016-08-15 LAB — CBC WITH DIFFERENTIAL/PLATELET
Basophils Absolute: 0 10*3/uL (ref 0.0–0.1)
Basophils Relative: 0 %
Eosinophils Absolute: 0.1 10*3/uL (ref 0.0–0.7)
Eosinophils Relative: 2 %
HEMATOCRIT: 32.3 % — AB (ref 36.0–46.0)
HEMOGLOBIN: 10 g/dL — AB (ref 12.0–15.0)
LYMPHS ABS: 0.8 10*3/uL (ref 0.7–4.0)
LYMPHS PCT: 37 %
MCH: 28.9 pg (ref 26.0–34.0)
MCHC: 31 g/dL (ref 30.0–36.0)
MCV: 93.4 fL (ref 78.0–100.0)
MONOS PCT: 6 %
Monocytes Absolute: 0.1 10*3/uL (ref 0.1–1.0)
NEUTROS ABS: 1.2 10*3/uL — AB (ref 1.7–7.7)
NEUTROS PCT: 54 %
Platelets: 106 10*3/uL — ABNORMAL LOW (ref 150–400)
RBC: 3.46 MIL/uL — ABNORMAL LOW (ref 3.87–5.11)
RDW: 16.5 % — ABNORMAL HIGH (ref 11.5–15.5)
WBC: 2.2 10*3/uL — ABNORMAL LOW (ref 4.0–10.5)

## 2016-08-15 LAB — MAGNESIUM: Magnesium: 2.5 mg/dL — ABNORMAL HIGH (ref 1.7–2.4)

## 2016-08-15 LAB — SEDIMENTATION RATE: Sed Rate: 10 mm/hr (ref 0–22)

## 2016-08-15 LAB — C-REACTIVE PROTEIN: CRP: <0.8 mg/dL (ref <1.0)

## 2016-08-15 LAB — PHOSPHORUS: Phosphorus: 2.9 mg/dL (ref 2.5–4.6)

## 2016-08-15 MED ORDER — MORPHINE SULFATE (PF) 4 MG/ML IV SOLN
4.0000 mg | Freq: Once | INTRAVENOUS | Status: AC
  Administered 2016-08-15: 4 mg via INTRAVENOUS
  Filled 2016-08-15: qty 1

## 2016-08-15 MED ORDER — METOCLOPRAMIDE HCL 5 MG/ML IJ SOLN
10.0000 mg | Freq: Once | INTRAMUSCULAR | Status: AC
  Administered 2016-08-15: 10 mg via INTRAVENOUS
  Filled 2016-08-15: qty 2

## 2016-08-15 MED ORDER — HYDROCODONE-ACETAMINOPHEN 5-325 MG PO TABS: 1.0000 | ORAL_TABLET | Freq: Four times a day (QID) | ORAL | 0 refills | Status: DC | PRN

## 2016-08-15 MED ORDER — DIPHENHYDRAMINE HCL 50 MG/ML IJ SOLN
25.0000 mg | Freq: Once | INTRAMUSCULAR | Status: AC
  Administered 2016-08-15: 25 mg via INTRAVENOUS
  Filled 2016-08-15: qty 1

## 2016-08-15 NOTE — ED Notes (Signed)
Patient transported to CT 

## 2016-08-15 NOTE — Care Management (Signed)
ED CM met with patient at bedside to discuss recommendations for transitional care on ED evaluation patient does not meet criteria for inpatient admission. Patient verbalized understanding teach back done. Discussed [facility] services as option, patient is agreeable.  Offered choice patient selected [facility]. Referral was faxed via [facility] faxing. No further questions or concerns verbalized Dr. Maryan Rued EDP updated on transitional care plan, she is agreeable. No additional ED CM needs identified.

## 2016-08-15 NOTE — ED Notes (Signed)
ED Provider at bedside. 

## 2016-08-15 NOTE — ED Triage Notes (Signed)
Pt to ED from dialysis center with graft in right arm accessed. Pt c/o leg pain and weakness-- states is unable to walk up stairs-- and is asking to go back to the nursing home. Was at [facility] and left against advice.

## 2016-08-15 NOTE — ED Provider Notes (Signed)
[facility] DEPT Provider Note   CSN: [id number] Arrival date & time: 08/15/16  1554     History   Chief Complaint Chief Complaint  Patient presents with  . Headache  . sores on body  . Leg Pain    HPI [name] is a 51 y.o. female.  Patient is a 51 year old female with a history of end-stage renal disease on dialysis, CHF, anemia, hepatitis C, hypertension, seizure and stroke presenting today from dialysis with generalized weakness, painful rash diffusely over her body and severe left-sided headache. Patient states she was able to walk into dialysis but her legs are extremely weak and she is unable to get up from a seated position independently. She states approximately one week ago she started breaking out with painful sores. They're on her bilateral lower extremities, upper extremities, abdomen, back and scalp. They do not itch but break open and bleed. Nobody else in her home with similar symptoms. She has never had anything like this before. But states that ever since the started she became weaker and weaker. Today she also developed a left-sided headache with tunnel vision. She does have a history of migraines but is concern for a stroke because she has had a history of stroke. She denies any unilateral weakness numbness or speech difficulty. She's had no nausea or vomiting, chest pain or shortness of breath. Last dialysis was today.      Past Medical History:  Diagnosis Date  . Anemia   . Anxiety   . Bipolar 1 disorder ([place])   . Bipolar disorder, unspecified 02/06/2014  . Chronic combined systolic and diastolic CHF (congestive heart failure) (HCC)    EF 40% by echo and 48% by stress test  . Chronic pain syndrome   . Depression   . ESRD on hemodialysis ([place])   . GERD (gastroesophageal reflux disease)   . Glaucoma   . Headache    sinus headaches  . Hepatitis C   . High cholesterol   . History of hiatal hernia   . History of vitamin D deficiency 06/20/2015  .  Hypertension   . MRSA infection ?2014   "on the back of my head; spread throughout my bloodstream"  . Neuropathy ([place])   . Refusal of blood transfusions as patient is Jehovah's Witness   . Schizoaffective disorder, unspecified condition 02/08/2014  . Seizure [facility]) dx'd 2014   "don't know what kind; last one was ~ 04/2014"  . Stroke [facility])    TIA 2009  . TIA (transient ischemic attack) 2010  . Type II diabetes mellitus ([place])    INSULIN DEPENDENT    Patient Active Problem List   Diagnosis Date Noted  . Volume overload 07/21/2016  . Toe fracture, right 07/21/2016  . Pulmonary edema 03/03/2016  . Acute respiratory failure ([place]) 03/03/2016  . Respiratory failure with hypoxia ([place]) 03/03/2016  . Acute pulmonary edema ([place]) 01/14/2016  . Pneumonia 01/13/2016  . Hyperkalemia 12/25/2015  . Constipation 12/06/2015  . 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 Dyslipidemia associated with type 2 diabetes mellitus ([place]) 11/10/2015  . Depression   . Palliative care encounter   . Advanced directives, counseling/discussion   . GI bleeding 09/17/2015  . GIB (gastrointestinal bleeding) 09/17/2015  . Schizoaffective disorder ([place])   . Abnormal drug screen (06/19/2015) (Cocaine matabolites detected) 07/04/2015  . Chronic right elbow pain 06/20/2015  . Chronic left foot pain 06/20/2015  . History of TIA (  transient ischemic attack) 06/20/2015  . History of stroke 06/20/2015  . History of vitamin D deficiency 06/20/2015  . Substance use disorder Risk: "Very High" 06/20/2015  . Long term prescription opiate use 06/20/2015  . Chronic pain 06/19/2015  . Chronic pain syndrome 06/19/2015  . Cocaine abuse 06/19/2015  . Left forearm pain   . ESRD (end stage renal disease) on dialysis ([place])   . Pancreatitis 05/10/2015  . Left arm pain 05/10/2015  . Diabetes mellitus with stage 5 chronic kidney disease ([place])  05/10/2015  . Chronic renal failure, stage 5 (HCC)   . Dialysis patient ([place])   . Acute renal failure ([place]) 05/01/2015  . Metabolic acidosis 65/46/5035  . Diabetes mellitus with nephropathy ([place]) 05/01/2015  . RUQ abdominal pain 05/01/2015  . Hypertensive urgency 05/01/2015  . Anemia of chronic kidney failure 05/01/2015  . Transcondylar fracture of distal humerus 12/26/2014  . Impaired renal function 12/26/2014  . Closed fracture of part of upper end of humerus 12/14/2014  . Nontraumatic amputation of left foot ([place]) 12/13/2014  . Unilateral visual loss 11/22/2014  . 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 ([place]) 11/22/2014  . Chronic combined systolic and diastolic CHF (congestive heart failure) ([place])   . Anemia, iron deficiency 04/13/2014  . Schizoaffective disorder, unspecified type ([place]) 02/08/2014  . Bipolar disorder ([place]) 02/06/2014  . Noncompliance 02/02/2014  . Hep C w/o coma, chronic ([place]) 05/18/2013  . CKD (chronic kidney disease) stage 5, GFR less than 15 ml/min (HCC) 05/18/2013  . Seizure disorder ([place]) 02/09/2013    Past Surgical History:  Procedure Laterality Date  . ABDOMINAL HYSTERECTOMY  2014  . ABDOMINAL HYSTERECTOMY  2010  . AMPUTATION Left 05/21/2013   Procedure: Left Midfoot AMPUTATION;  Surgeon: Newt Minion, MD;  Location: [facility];  Service: Orthopedics;  Laterality: Left;  Left Midfoot Amputation  . AV FISTULA PLACEMENT Left 05/04/2015   Procedure: ARTERIOVENOUS (AV) GRAFT INSERTION;  Surgeon: Angelia Mould, MD;  Location: [facility];  Service: Vascular;  Laterality: Left;  . COLONOSCOPY WITH PROPOFOL N/A 06/22/2013   Procedure: COLONOSCOPY WITH PROPOFOL;  Surgeon: Jeryl Columbia, MD;  Location: [facility] ENDOSCOPY;  Service: Endoscopy;  Laterality: N/A;  . ESOPHAGOGASTRODUODENOSCOPY N/A 05/11/2015   Procedure: ESOPHAGOGASTRODUODENOSCOPY (EGD);  Surgeon: Laurence Spates, MD;  Location[facility] ENDOSCOPY;  Service: Endoscopy;  Laterality: N/A;  . EYE SURGERY Bilateral  2010   Lasik  . FOOT AMPUTATION Left    due diabetic neuropathy, could not feel ulcer on bottom of foot  . REFRACTIVE SURGERY Bilateral 2013  . TONSILLECTOMY  1970's  . TUBAL LIGATION    . TUBAL LIGATION  2000    OB History    Gravida Para Term Preterm AB Living   0 0 0 0 0     SAB TAB Ectopic Multiple Live Births   0 0 0           Home Medications    Prior to Admission medications   Medication Sig Start Date End Date Taking? Authorizing Provider  acetaminophen (TYLENOL) 325 MG tablet Take 2 tablets (650 mg total) by mouth every 6 (six) hours as needed for mild pain (or Fever >/= 101). 09/20/15   Delfina Redwood, MD  Amino Acids-Protein Hydrolys (FEEDING SUPPLEMENT, PRO-STAT SUGAR FREE 64,) LIQD Take 30 mLs by mouth 2 (two) times daily. 07/24/16   Lavina Hamman, MD  amLODipine (NORVASC) 10 MG tablet Take 1 tablet (10 mg total) by mouth at bedtime. 01/16/16   Shanon Brow  Tat, MD  atorvastatin (LIPITOR) 40 MG tablet Take 1 tablet (40 mg total) by mouth at bedtime. 01/16/16   Orson Eva, MD  B Complex-C-Folic Acid (RENAL VITAMIN PO) Take 1 tablet by mouth at bedtime.    Historical Provider, MD  calcium acetate (PHOSLO) 667 MG capsule Take 2 capsules (1,334 mg total) by mouth 3 (three) times daily with meals. 07/24/16   Lavina Hamman, MD  carvedilol (COREG) 3.125 MG tablet Take 1 tablet (3.125 mg total) by mouth 2 (two) times daily with a meal. Hold if HR <60 Patient taking differently: Take 25 mg by mouth 2 (two) times daily with a meal. Hold if HR <60 01/16/16   Orson Eva, MD  cloNIDine (CATAPRES) 0.1 MG tablet Take 1 tablet (0.1 mg total) by mouth every 8 (eight) hours. 01/16/16   Orson Eva, MD  diphenhydrAMINE (BENADRYL) 25 mg capsule Take 1 capsule (25 mg total) by mouth every 6 (six) hours as needed for itching. 05/07/15   Ripudeep Krystal Eaton, MD  folic acid (FOLVITE) 1 MG tablet Take 1 tablet (1 mg total) by mouth 2 (two) times daily with a meal. 07/25/16   Lavina Hamman, MD  insulin aspart  (NOVOLOG) 100 UNIT/ML injection Inject 0-12 Units into the skin 3 (three) times daily before meals. Per sliding scale inject SQ <150=0units, 151-200=2 units, 201-250=4units, 251-300=6units,301-350=8 units, 351-400=10 units, >/= 401=12 units Patient taking differently: Inject 2-12 Units into the skin 3 (three) times daily before meals. Inject 2 units subcutaneously daily before meals plus sliding scale adjustment based on CBG 01/16/16   Orson Eva, MD  insulin glargine (LANTUS) 100 UNIT/ML injection Inject 0.25 mLs (25 Units total) into the skin at bedtime. 03/05/16   Barton Dubois, MD  lidocaine-prilocaine (EMLA) cream Apply 1 application topically See admin instructions. Apply to dialysis site one hour prior to dialysis    Historical Provider, MD  LORazepam (ATIVAN) 0.5 MG tablet Take 0.5 mg by mouth See admin instructions. Take 1 tablet (0.5 mg) by mouth prior to dialysis and at bedtime as needed for sleep    Historical Provider, MD  polyethylene glycol (MIRALAX / GLYCOLAX) packet Take 17 g by mouth daily. 07/24/16   Lavina Hamman, MD  polyvinyl alcohol (LIQUIFILM TEARS) 1.4 % ophthalmic solution Place 2 drops into both eyes every 6 (six) hours as needed for dry eyes.     Historical Provider, MD  senna-docusate (SENOKOT-S) 8.6-50 MG tablet Take 1 tablet by mouth 2 (two) times daily. 07/24/16   Lavina Hamman, MD  sevelamer carbonate (RENVELA) 800 MG tablet Take 3 tablets (2,400 mg total) by mouth 3 (three) times daily with meals. 01/16/16   Orson Eva, MD    Family History Family History  Problem Relation Age of Onset  . Hypertension Mother   . Hypertension Father     Social History Social History  Substance Use Topics  . Smoking status: Former Smoker    Packs/day: 1.00    Years: 20.00    Types: Cigarettes    Quit date: 03/31/2015  . Smokeless tobacco: Never Used     Comment: "stopped smoking in ~ 2013; restarted last week; stopped again 06/17/2014"  . Alcohol use 1.8 oz/week    3 Standard  drinks or equivalent per week     Comment: 05/01/15 patient denies alcohol use.     Allergies   Penicillins; Gabapentin; Lyrica [pregabalin]; Saphris [asenapine]; Tramadol; and Other   Review of Systems Review of Systems  All other systems  reviewed and are negative.    Physical Exam Updated Vital Signs BP 186/93 (BP Location: Right Arm)   Pulse 88   Temp 97.4 F (36.3 C) (Oral)   Resp 18   LMP  (LMP Unknown)   SpO2 100%   Physical Exam  Constitutional: She is oriented to person, place, and time. She appears well-developed and well-nourished. No distress.  HENT:  Head: Normocephalic and atraumatic.  Mouth/Throat: Oropharynx is clear and moist.  Eyes: Conjunctivae and EOM are normal. Pupils are equal, round, and reactive to light.  Neck: Normal range of motion. Neck supple.  Cardiovascular: Normal rate, regular rhythm and intact distal pulses.   No murmur heard. Pulmonary/Chest: Effort normal and breath sounds normal. No respiratory distress. She has no wheezes. She has no rales.  Abdominal: Soft. She exhibits no distension. There is no tenderness. There is no rebound and no guarding.  Musculoskeletal: Normal range of motion. She exhibits tenderness. She exhibits no edema.  Fistula present in the left upper extremity. Palpable thrill present. Currently accessed.  Right great toe with wound present. No drainage or significant erythema but tender to touch.  Left mid foot amputation  Neurological: She is alert and oriented to person, place, and time. No cranial nerve deficit or sensory deficit. Coordination and gait normal.  Moving upper and lower ext equally but mild weakness in the lower ext bilaterally 4/5  Skin: Skin is warm and dry. Rash noted. No erythema.  Scattered lesions over that from, upper and lower extremities. They vary in size and are in the area stages of healing. Some are freshly open wounds others are scabbed over. They're mildly tender to touch but no  surrounding erythema. No drainage from the lesions.  Psychiatric: She has a normal mood and affect. Her behavior is normal.  Nursing note and vitals reviewed.    ED Treatments / Results  Labs (all labs ordered are listed, but only abnormal results are displayed) Labs Reviewed  CBC WITH DIFFERENTIAL/PLATELET - Abnormal; Notable for the following:       Result Value   WBC 2.2 (*)    RBC 3.46 (*)    Hemoglobin 10.0 (*)    HCT 32.3 (*)    RDW 16.5 (*)    Platelets 106 (*)    Neutro Abs 1.2 (*)    All other components within normal limits  COMPREHENSIVE METABOLIC PANEL - Abnormal; Notable for the following:    [place] 95 (*)    Glucose, Bld 118 (*)    BUN 22 (*)    Creatinine, Ser 4.99 (*)    Calcium 8.7 (*)    Total Bilirubin 1.3 (*)    GFR calc non Af Amer 9 (*)    GFR calc Af Amer 11 (*)    All other components within normal limits  MAGNESIUM - Abnormal; Notable for the following:    Magnesium 2.5 (*)    All other components within normal limits  C-REACTIVE PROTEIN  SEDIMENTATION RATE  PHOSPHORUS    EKG  EKG Interpretation None       Radiology Ct Head Wo Contrast  Result Date: 08/15/2016 CLINICAL DATA:  Visual changes in the right eye starting yesterday. Headache. EXAM: CT HEAD WITHOUT CONTRAST TECHNIQUE: Contiguous axial images were obtained from the base of the skull through the vertex without intravenous contrast. COMPARISON:  Brain MRI 11/22/2014.  Head CT 11/22/2014. FINDINGS: Brain: There is no evidence for acute hemorrhage, hydrocephalus, mass lesion, or abnormal extra-axial fluid collection. No definite  CT evidence for acute infarction. Diffuse loss of parenchymal volume is consistent with atrophy. Symmetric enlargement of the sylvian fissure bilaterally is stable and compatible with the underlying atrophy. Encephalomalacia in the medial left occipital lobe is unchanged. Lacunar infarct or benign cyst identified left basal ganglia. Patchy low attenuation in the  deep hemispheric and periventricular white matter is nonspecific, but likely reflects chronic microvascular ischemic demyelination. Vascular: Atherosclerotic calcification is visualized in the carotid arteries. No dense MCA sign. Major dural sinuses are unremarkable. Skull: No evidence for fracture. No worrisome lytic or sclerotic lesion. Sinuses/Orbits: The visualized paranasal sinuses and mastoid air cells are clear. Visualized portions of the globes and intraorbital fat are unremarkable. Other: None. IMPRESSION: 1. Stable.  No acute intracranial abnormality. 2. Atrophy with chronic small vessel white matter ischemic disease. Electronically Signed   By: Misty Stanley M.D.   On: 08/15/2016 20:03   Dg Toe Great Right  Result Date: 08/15/2016 CLINICAL DATA:  Sore/wounds with unknown origin.  Right foot pain. EXAM: RIGHT GREAT TOE COMPARISON:  07/21/2016 FINDINGS: There is a healing minimally displaced fracture of the mid phalanx of the right great toe. No other osseous abnormality seen. Vascular calcifications are noted. IMPRESSION: Healing minimally displaced fracture of the mid phalanx of the right great toe. Vascular calcifications. Electronically Signed   By: Fidela Salisbury M.D.   On: 08/15/2016 20:23    Procedures Procedures (including critical care time)  Medications Ordered in ED Medications  metoCLOPramide (REGLAN) injection 10 mg (not administered)  diphenhydrAMINE (BENADRYL) injection 25 mg (not administered)     Initial Impression / Assessment and Plan / ED Course  I have reviewed the triage vital signs and the nursing notes.  Pertinent labs & imaging results that were available during my care of the patient were reviewed by me and considered in my medical decision making (see chart for details).  Clinical Course     Patient is a 51 year old female with multiple medical problems presenting today with generalized weakness, painful sores diffusely over the body and headache.  Rash is not consistent with bedbugs or scabies. Patient's sister did recently have shingles however she says she has not had contact with her sister. Nothing that looks like shingles but potential for varicella outbreak versus lesions from calcium deposits also a wound on the right great toe concerning for new ulcer and possible underlying osteo.  Patient also is complaining of headache which seems more migrainous. She is not displaying any signs of stroke at this time. Patient given headache cocktail as well as pain medication for the lesions.  9:10 PM Labs within normal CRP, sedimentation rate and calcium level. CMP without acute findings. CBC with mild leukopenia of 2.2 and new thrombocytopenia 106. Patient's phosphorus levels within normal limits and mildly decreased magnesium level. CT of the head negative for acute findings and x-ray of the toe shows no sign of osseous but a healing minimally displaced fracture. Patient does not have signs significant for cellulitis and no drainage from the wound on her toe however she will need to keep a close eye on it. Unsure the etiology of the lesions possibility for atypical varus but patient does not have fever here. She does not have any signs of encephalitis or respiratory symptoms. Minimal improvement with a headache cocktail patient was given pain control. Patient risk requesting nursing home placement because she left a months ago [place] and has not been doing well at home. At this time there is no acute  reason for admission but will have social work see the patient.  10:49 PM Home health set up as well as  Social work for possible placement in the future.  No findings to admit tonight.  Pt taken back to her home where she lives with sister.  Will need to f/u with pCP and given pain control  Final Clinical Impressions(s) / ED Diagnoses   Final diagnoses:  Rash and nonspecific skin eruption  Displaced fracture of proximal phalanx of  unspecified great toe, initial encounter for closed fracture    New Prescriptions New Prescriptions   HYDROCODONE-ACETAMINOPHEN (NORCO/VICODIN) 5-325 MG TABLET    Take 1-2 tablets by mouth every 6 (six) hours as needed.     Blanchie Dessert, MD 08/15/16 2250

## 2016-08-16 NOTE — ED Notes (Signed)
Pt understands discharge instructions and is leaving via [facility]

## 2016-08-16 NOTE — ED Notes (Signed)
Unable to get discharge signature due to no signature pad due to pt being in hallway. Pt states understanding discharge instructions

## 2016-08-20 IMAGING — CT CT ABD-PELV W/ CM
2 of 5 series · 16 of 46 positions shown, 18 images · IV contrast (Omni 300)
Comparison: Ultrasound 05/01/2015 and previous

CLINICAL DATA: Right flank pain over the last 2 weeks. Dialysis
patient.

EXAM:
CT ABDOMEN AND PELVIS WITH CONTRAST
TECHNIQUE: Multidetector CT imaging of the abdomen and pelvis was performed
using the standard protocol following bolus administration of
intravenous contrast.
CONTRAST:  100mL OMNIPAQUE IOHEXOL 300 MG/ML  SOLN

[Series 2: a/p w/ 5mm · axial · 0.79mm/px · z∈[+833,+1263]mm · 13 of 100 slices shown, 15 images]
[im 7/100  soft-tissue]
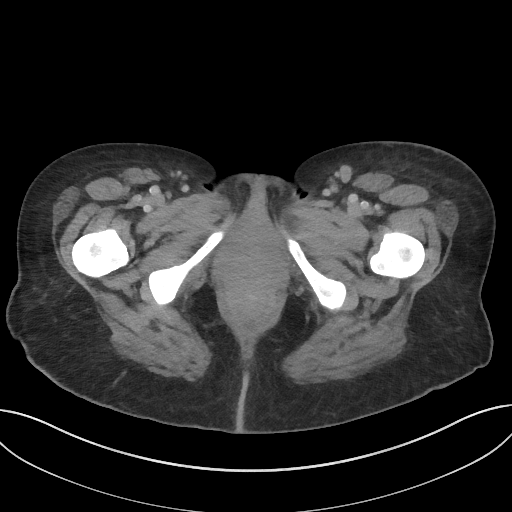
[im 7/100  bone]
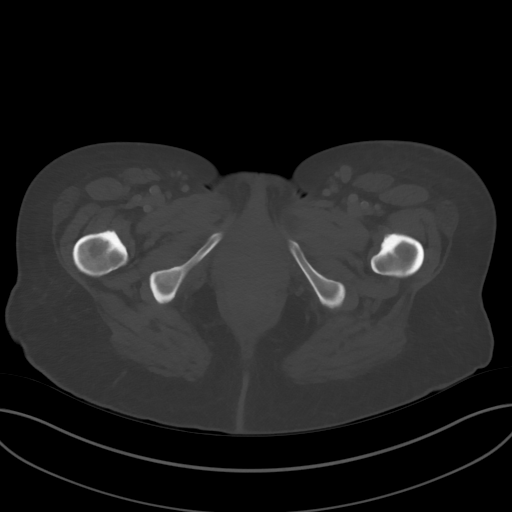
[im 13/100  soft-tissue]
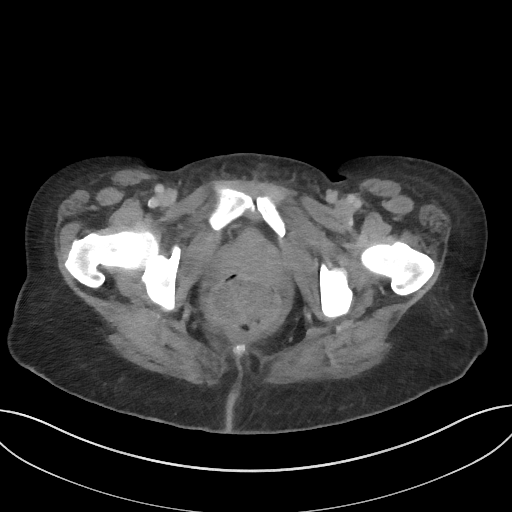
[im 19/100  soft-tissue]
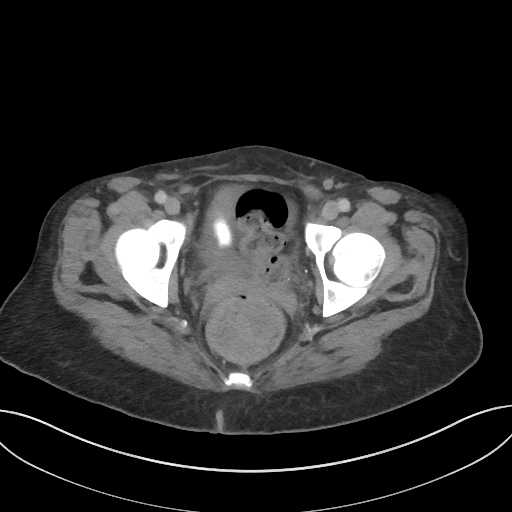
[im 31/100  soft-tissue]
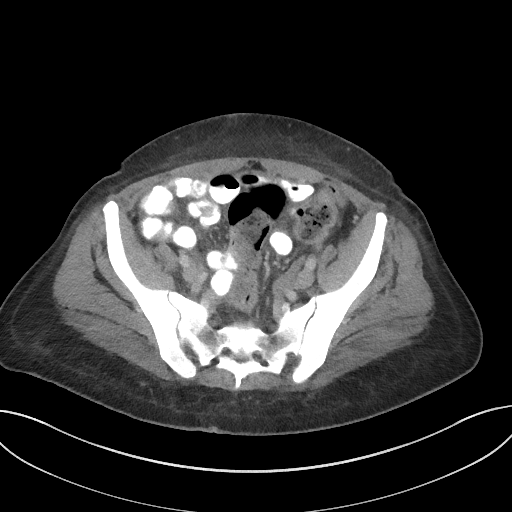
[im 38/100  soft-tissue]
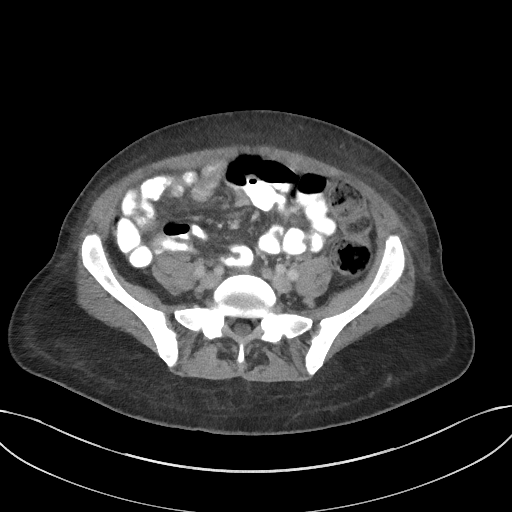
[im 44/100  soft-tissue]
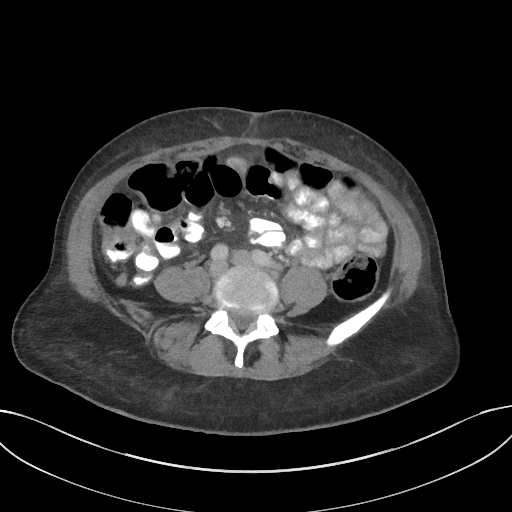
[im 50/100  soft-tissue]
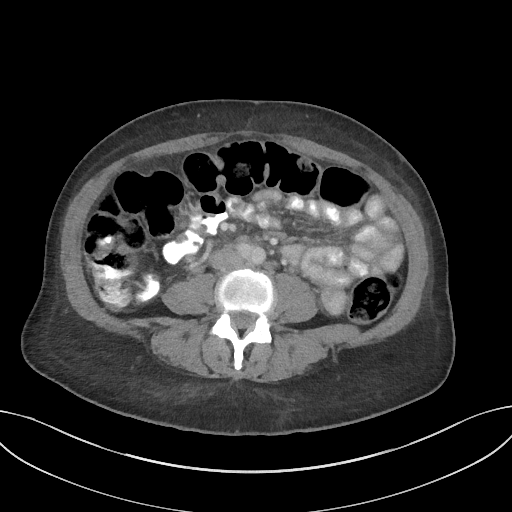
[im 56/100  soft-tissue]
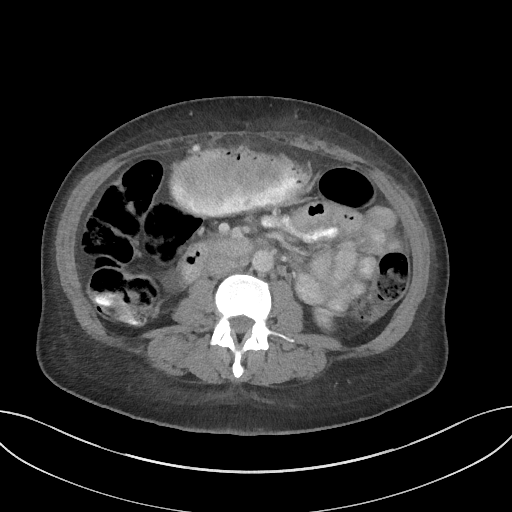
[im 62/100  soft-tissue]
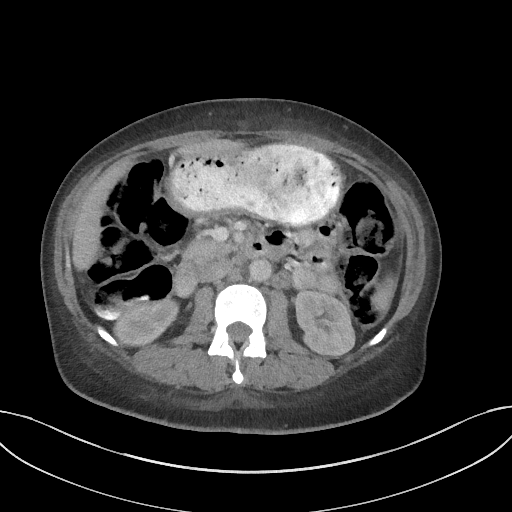
[im 62/100  bone]
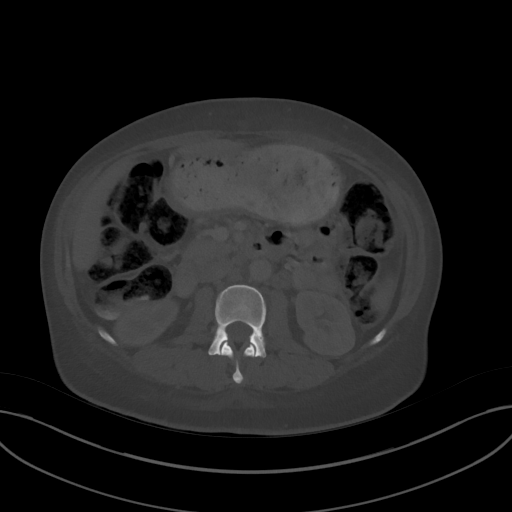
[im 69/100  soft-tissue]
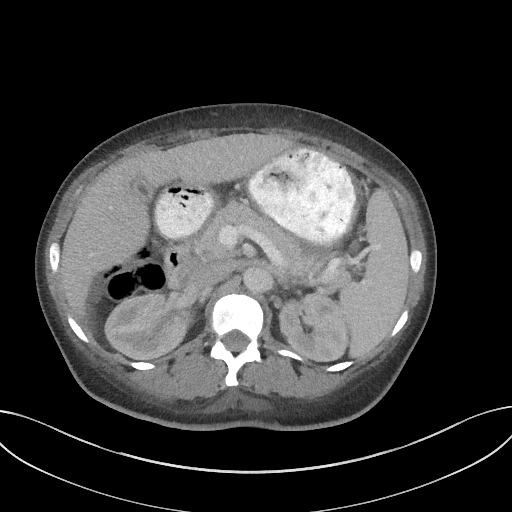
[im 81/100  soft-tissue]
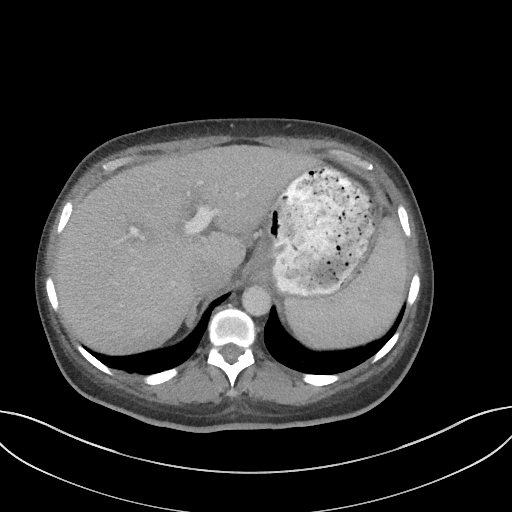
[im 87/100  soft-tissue]
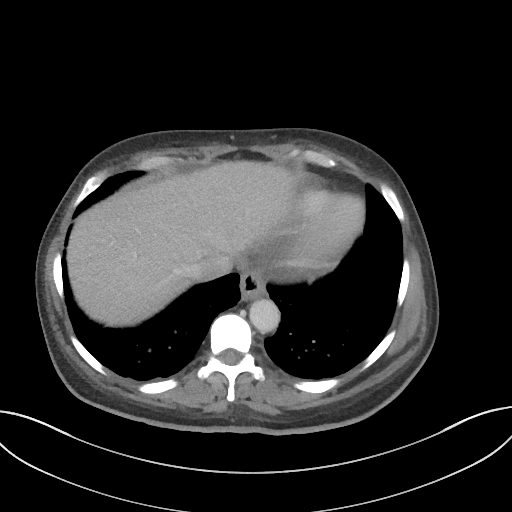
[im 93/100  soft-tissue]
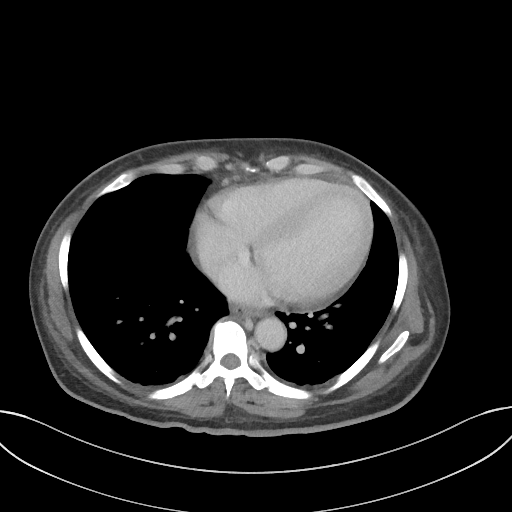

[Series 5: a/p w/ cor · coronal · 0.71mm/px · 3 of 147 slices shown]
[im 49/147  soft-tissue]
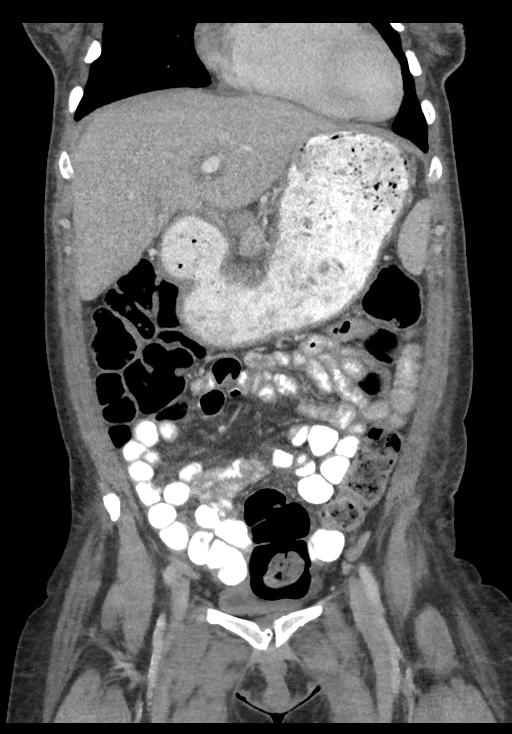
[im 65/147  soft-tissue]
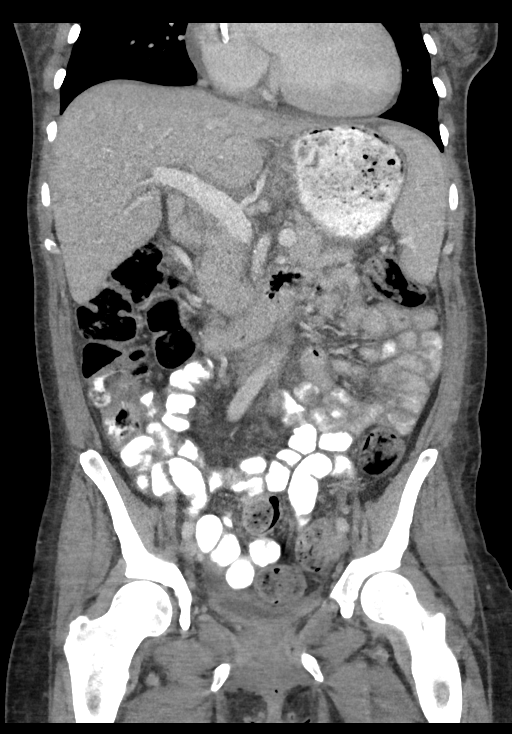
[im 82/147  soft-tissue]
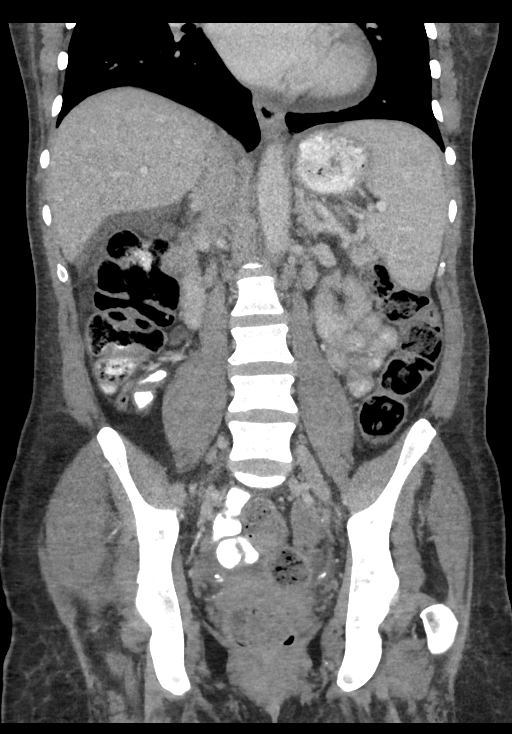

[16 of 46 positions shown; findings below may reference images not displayed]

FINDINGS: Lung bases are clear except for mild scarring.

The liver has a normal appearance. No focal lesions. The gallbladder
is collapsed. There is a small stone in the gallbladder neck. The
spleen is at the upper limits of normal in size the pancreas is
normal. The adrenal glands are normal the kidneys are fairly normal
in size but are poorly/nonfunctional. No evidence of renal stone
disease or obstruction. The aorta and IVC are normal. There are
small retroperitoneal lymph nodes, probably not significant. Bladder
is largely collapsed. There is a large amount of fecal matter in the
rectosigmoid. No other bowel finding of note. There may be a small
amount of free fluid in the pelvis, nonspecific. The appendix is
normal. There appears to have been previous hysterectomy. No
significant bone finding.
IMPRESSION: No acute finding to explain right flank pain.

The gallbladder is collapsed and contains at least 2 few small
gallstones. No CT evidence of cystic duct obstruction or
cholecystitis.

Poorly functioning/ non functioning kidneys. No acute renal process
evident. No sign of stone disease or obstruction.

Fairly large amount of stool in the rectosigmoid region. No other
bowel finding. Normal appearing appendix.

## 2016-08-25 ENCOUNTER — Emergency Department (HOSPITAL_COMMUNITY)
Admission: EM | Admit: 2016-08-25 | Discharge: 2016-08-28 | Disposition: A | Discharge status: Other Acute Inpatient Hospital | Payer: Medicare Other | Attending: Emergency Medicine | Admitting: Emergency Medicine

## 2016-08-25 ENCOUNTER — Encounter (HOSPITAL_COMMUNITY): Payer: Self-pay | Admitting: Emergency Medicine

## 2016-08-25 DIAGNOSIS — E1140 Type 2 diabetes mellitus with diabetic neuropathy, unspecified: Secondary | ICD-10-CM | POA: Insufficient documentation

## 2016-08-25 DIAGNOSIS — F1910 Other psychoactive substance abuse, uncomplicated: Secondary | ICD-10-CM | POA: Diagnosis not present

## 2016-08-25 DIAGNOSIS — Z794 Long term (current) use of insulin: Secondary | ICD-10-CM | POA: Insufficient documentation

## 2016-08-25 DIAGNOSIS — D631 Anemia in chronic kidney disease: Secondary | ICD-10-CM | POA: Insufficient documentation

## 2016-08-25 DIAGNOSIS — Z992 Dependence on renal dialysis: Secondary | ICD-10-CM | POA: Insufficient documentation

## 2016-08-25 DIAGNOSIS — I132 Hypertensive heart and chronic kidney disease with heart failure and with stage 5 chronic kidney disease, or end stage renal disease: Secondary | ICD-10-CM | POA: Insufficient documentation

## 2016-08-25 DIAGNOSIS — I5042 Chronic combined systolic (congestive) and diastolic (congestive) heart failure: Secondary | ICD-10-CM | POA: Insufficient documentation

## 2016-08-25 DIAGNOSIS — R0602 Shortness of breath: Secondary | ICD-10-CM

## 2016-08-25 DIAGNOSIS — R0600 Dyspnea, unspecified: Secondary | ICD-10-CM

## 2016-08-25 DIAGNOSIS — E1122 Type 2 diabetes mellitus with diabetic chronic kidney disease: Secondary | ICD-10-CM | POA: Diagnosis not present

## 2016-08-25 DIAGNOSIS — Z87891 Personal history of nicotine dependence: Secondary | ICD-10-CM | POA: Diagnosis not present

## 2016-08-25 DIAGNOSIS — Z79899 Other long term (current) drug therapy: Secondary | ICD-10-CM | POA: Diagnosis not present

## 2016-08-25 DIAGNOSIS — Z8673 Personal history of transient ischemic attack (TIA), and cerebral infarction without residual deficits: Secondary | ICD-10-CM | POA: Insufficient documentation

## 2016-08-25 DIAGNOSIS — R45851 Suicidal ideations: Secondary | ICD-10-CM | POA: Insufficient documentation

## 2016-08-25 DIAGNOSIS — Z90710 Acquired absence of both cervix and uterus: Secondary | ICD-10-CM | POA: Diagnosis not present

## 2016-08-25 DIAGNOSIS — E119 Type 2 diabetes mellitus without complications: Secondary | ICD-10-CM

## 2016-08-25 DIAGNOSIS — N186 End stage renal disease: Secondary | ICD-10-CM | POA: Diagnosis not present

## 2016-08-25 DIAGNOSIS — F250 Schizoaffective disorder, bipolar type: Secondary | ICD-10-CM | POA: Diagnosis not present

## 2016-08-25 DIAGNOSIS — F4321 Adjustment disorder with depressed mood: Secondary | ICD-10-CM | POA: Diagnosis not present

## 2016-08-25 MED ORDER — GI COCKTAIL ~~LOC~~
30.0000 mL | Freq: Once | ORAL | Status: AC
  Administered 2016-08-26: 30 mL via ORAL
  Filled 2016-08-25: qty 30

## 2016-08-25 MED ORDER — PANTOPRAZOLE SODIUM 40 MG PO TBEC
40.0000 mg | DELAYED_RELEASE_TABLET | Freq: Once | ORAL | Status: AC
  Administered 2016-08-26: 40 mg via ORAL
  Filled 2016-08-25: qty 1

## 2016-08-25 MED ORDER — ONDANSETRON 4 MG PO TBDP
8.0000 mg | ORAL_TABLET | Freq: Once | ORAL | Status: AC
  Administered 2016-08-26: 8 mg via ORAL
  Filled 2016-08-25: qty 2

## 2016-08-25 NOTE — ED Provider Notes (Signed)
[facility] DEPT Provider Note   CSN: [id number] Arrival date & time: 08/25/16  2334  By signing my name below, I, [name], attest that this documentation has been prepared under the direction and in the presence of [name], MD. Electronically Signed: Oleh [name], Scribe. 08/25/16. 11:37 PM.   History   Chief Complaint No chief complaint on file.   HPI [name] is a 51 y.o. female with history of seizures, schizoaffective disorder, prior cocaine use, ESRD on hemodialysis MWF, IDDM, and CHF presents to the ED with multiple complaints in the setting of missed dialysis. This patient presents today stating that she is experiencing chest pain with dyspnea since yesterday. Denies any diaphoresis. She is also reporting abdominal discomfort with nausea. Pain is worse following PO intake. However denies any vomiting. She missed her scheduled dialysis session 2 days ago, however she did attend a dialysis session 3 days ago; this was scheduled after the patient missed a dialysis appointment earlier in the week. She has no other concerns at this time. She has no taken any OTC therapy for her symptoms.   The history is provided by the patient. No language interpreter was used.    Past Medical History:  Diagnosis Date  . Anemia   . Anxiety   . Bipolar 1 disorder ([place])   . Bipolar disorder, unspecified 02/06/2014  . Chronic combined systolic and diastolic CHF (congestive heart failure) (HCC)    EF 40% by echo and 48% by stress test  . Chronic pain syndrome   . Depression   . ESRD on hemodialysis ([place])   . GERD (gastroesophageal reflux disease)   . Glaucoma   . Headache    sinus headaches  . Hepatitis C   . High cholesterol   . History of hiatal hernia   . History of vitamin D deficiency 06/20/2015  . Hypertension   . MRSA infection ?2014   "on the back of my head; spread throughout my bloodstream"  . Neuropathy ([place])   . Refusal of blood transfusions as patient is  [employer]   . Schizoaffective disorder, unspecified condition 02/08/2014  . Seizure [facility]) dx'd 2014   "don't know what kind; last one was ~ 04/2014"  . Stroke [facility])    TIA 2009  . TIA (transient ischemic attack) 2010  . Type II diabetes mellitus ([place])    INSULIN DEPENDENT    Patient Active Problem List   Diagnosis Date Noted  . Volume overload 07/21/2016  . Toe fracture, right 07/21/2016  . Pulmonary edema 03/03/2016  . Acute respiratory failure ([place]) 03/03/2016  . Respiratory failure with hypoxia ([place]) 03/03/2016  . Acute pulmonary edema ([place]) 01/14/2016  . Pneumonia 01/13/2016  . Hyperkalemia 12/25/2015  . Constipation 12/06/2015  . 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 Dyslipidemia associated with type 2 diabetes mellitus ([place]) 11/10/2015  . Depression   . Palliative care encounter   . Advanced directives, counseling/discussion   . GI bleeding 09/17/2015  . GIB (gastrointestinal bleeding) 09/17/2015  . Schizoaffective disorder ([place])   . Abnormal drug screen (06/19/2015) (Cocaine matabolites detected) 07/04/2015  . Chronic right elbow pain 06/20/2015  . Chronic left foot pain 06/20/2015  . History of TIA (transient ischemic attack) 06/20/2015  . History of stroke 06/20/2015  . History of vitamin D deficiency 06/20/2015  . Substance use disorder Risk: "Very High" 06/20/2015  . Long term prescription opiate use 06/20/2015  . Chronic  pain 06/19/2015  . Chronic pain syndrome 06/19/2015  . Cocaine abuse 06/19/2015  . Left forearm pain   . ESRD (end stage renal disease) on dialysis ([place])   . Pancreatitis 05/10/2015  . Left arm pain 05/10/2015  . Diabetes mellitus with stage 5 chronic kidney disease ([place]) 05/10/2015  . Chronic renal failure, stage 5 (HCC)   . Dialysis patient ([place])   . Acute renal failure ([place]) 05/01/2015  . Metabolic acidosis 18/84/1660  . Diabetes mellitus  with nephropathy ([place]) 05/01/2015  . RUQ abdominal pain 05/01/2015  . Hypertensive urgency 05/01/2015  . Anemia of chronic kidney failure 05/01/2015  . Transcondylar fracture of distal humerus 12/26/2014  . Impaired renal function 12/26/2014  . Closed fracture of part of upper end of humerus 12/14/2014  . Nontraumatic amputation of left foot ([place]) 12/13/2014  . Unilateral visual loss 11/22/2014  . 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 ([place]) 11/22/2014  . Chronic combined systolic and diastolic CHF (congestive heart failure) ([place])   . Anemia, iron deficiency 04/13/2014  . Schizoaffective disorder, unspecified type ([place]) 02/08/2014  . Bipolar disorder ([place]) 02/06/2014  . Noncompliance 02/02/2014  . Hep C w/o coma, chronic ([place]) 05/18/2013  . CKD (chronic kidney disease) stage 5, GFR less than 15 ml/min (HCC) 05/18/2013  . Seizure disorder ([place]) 02/09/2013    Past Surgical History:  Procedure Laterality Date  . ABDOMINAL HYSTERECTOMY  2014  . ABDOMINAL HYSTERECTOMY  2010  . AMPUTATION Left 05/21/2013   Procedure: Left Midfoot AMPUTATION;  Surgeon: Newt Minion, MD;  Location: [facility];  Service: Orthopedics;  Laterality: Left;  Left Midfoot Amputation  . AV FISTULA PLACEMENT Left 05/04/2015   Procedure: ARTERIOVENOUS (AV) GRAFT INSERTION;  Surgeon: Angelia Mould, MD;  Location: [place];  Service: Vascular;  Laterality: Left;  . COLONOSCOPY WITH PROPOFOL N/A 06/22/2013   Procedure: COLONOSCOPY WITH PROPOFOL;  Surgeon: Jeryl Columbia, MD;  Location: [facility];  Service: Endoscopy;  Laterality: N/A;  . ESOPHAGOGASTRODUODENOSCOPY N/A 05/11/2015   Procedure: ESOPHAGOGASTRODUODENOSCOPY (EGD);  Surgeon: Laurence Spates, MD;  Location: [facility]COPY;  Service: Endoscopy;  Laterality: N/A;  . EYE SURGERY Bilateral 2010   Lasik  . FOOT AMPUTATION Left    due diabetic neuropathy, could not feel ulcer on bottom of foot  . REFRACTIVE SURGERY Bilateral 2013  . TONSILLECTOMY  1970's  .  TUBAL LIGATION    . TUBAL LIGATION  2000    OB History    Gravida Para Term Preterm AB Living   0 0 0 0 0     SAB TAB Ectopic Multiple Live Births   0 0 0           Home Medications    Prior to Admission medications   Medication Sig Start Date End Date Taking? Authorizing Provider  acetaminophen (TYLENOL) 325 MG tablet Take 2 tablets (650 mg total) by mouth every 6 (six) hours as needed for mild pain (or Fever >/= 101). 09/20/15   Delfina Redwood, MD  Amino Acids-Protein Hydrolys (FEEDING SUPPLEMENT, PRO-STAT SUGAR FREE 64,) LIQD Take 30 mLs by mouth 2 (two) times daily. 07/24/16   Lavina Hamman, MD  amLODipine (NORVASC) 10 MG tablet Take 1 tablet (10 mg total) by mouth at bedtime. 01/16/16   Orson Eva, MD  atorvastatin (LIPITOR) 40 MG tablet Take 1 tablet (40 mg total) by mouth at bedtime. 01/16/16   Orson Eva, MD  B Complex-C-Folic Acid (RENAL VITAMIN PO) Take 1 tablet by mouth at bedtime.  Historical Provider, MD  calcium acetate (PHOSLO) 667 MG capsule Take 2 capsules (1,334 mg total) by mouth 3 (three) times daily with meals. 07/24/16   Lavina Hamman, MD  carvedilol (COREG) 3.125 MG tablet Take 1 tablet (3.125 mg total) by mouth 2 (two) times daily with a meal. Hold if HR <60 Patient taking differently: Take 25 mg by mouth 2 (two) times daily with a meal. Hold if HR <60 01/16/16   Orson Eva, MD  cloNIDine (CATAPRES) 0.1 MG tablet Take 1 tablet (0.1 mg total) by mouth every 8 (eight) hours. 01/16/16   Orson Eva, MD  diphenhydrAMINE (BENADRYL) 25 mg capsule Take 1 capsule (25 mg total) by mouth every 6 (six) hours as needed for itching. 05/07/15   Ripudeep Krystal Eaton, MD  folic acid (FOLVITE) 1 MG tablet Take 1 tablet (1 mg total) by mouth 2 (two) times daily with a meal. 07/25/16   Lavina Hamman, MD  HYDROcodone-acetaminophen (NORCO/VICODIN) 5-325 MG tablet Take 1-2 tablets by mouth every 6 (six) hours as needed. 08/15/16   Blanchie Dessert, MD  insulin aspart (NOVOLOG) 100 UNIT/ML  injection Inject 0-12 Units into the skin 3 (three) times daily before meals. Per sliding scale inject SQ <150=0units, 151-200=2 units, 201-250=4units, 251-300=6units,301-350=8 units, 351-400=10 units, >/= 401=12 units Patient taking differently: Inject 2-12 Units into the skin 3 (three) times daily before meals. Inject 2 units subcutaneously daily before meals plus sliding scale adjustment based on CBG 01/16/16   Orson Eva, MD  insulin glargine (LANTUS) 100 UNIT/ML injection Inject 0.25 mLs (25 Units total) into the skin at bedtime. 03/05/16   Barton Dubois, MD  lidocaine-prilocaine (EMLA) cream Apply 1 application topically See admin instructions. Apply to dialysis site one hour prior to dialysis    Historical Provider, MD  LORazepam (ATIVAN) 0.5 MG tablet Take 0.5 mg by mouth See admin instructions. Take 1 tablet (0.5 mg) by mouth prior to dialysis and at bedtime as needed for sleep    Historical Provider, MD  polyethylene glycol (MIRALAX / GLYCOLAX) packet Take 17 g by mouth daily. 07/24/16   Lavina Hamman, MD  polyvinyl alcohol (LIQUIFILM TEARS) 1.4 % ophthalmic solution Place 2 drops into both eyes every 6 (six) hours as needed for dry eyes.     Historical Provider, MD  senna-docusate (SENOKOT-S) 8.6-50 MG tablet Take 1 tablet by mouth 2 (two) times daily. 07/24/16   Lavina Hamman, MD  sevelamer carbonate (RENVELA) 800 MG tablet Take 3 tablets (2,400 mg total) by mouth 3 (three) times daily with meals. 01/16/16   Orson Eva, MD    Family History Family History  Problem Relation Age of Onset  . Hypertension Mother   . Hypertension Father     Social History Social History  Substance Use Topics  . Smoking status: Former Smoker    Packs/day: 1.00    Years: 20.00    Types: Cigarettes    Quit date: 03/31/2015  . Smokeless tobacco: Never Used     Comment: "stopped smoking in ~ 2013; restarted last week; stopped again 06/17/2014"  . Alcohol use 1.8 oz/week    3 Standard drinks or equivalent  per week     Comment: 05/01/15 patient denies alcohol use.     Allergies   Penicillins; Gabapentin; Lyrica [pregabalin]; Saphris [asenapine]; Tramadol; and Other   Review of Systems Review of Systems  Respiratory: Positive for shortness of breath.   Cardiovascular: Positive for chest pain.       No diaphoresis  Gastrointestinal: Positive for abdominal pain and nausea. Negative for vomiting.  All other systems reviewed and are negative.    Physical Exam Updated Vital Signs LMP  (LMP Unknown)   SpO2 98%   Physical Exam  Constitutional: She is oriented to person, place, and time. She appears well-developed and well-nourished.  HENT:  Head: Normocephalic and atraumatic.  Eyes: EOM are normal. Pupils are equal, round, and reactive to light.  Neck: Normal range of motion. Neck supple. No JVD present.  Cardiovascular: Normal rate, regular rhythm and normal heart sounds.   No murmur heard. Pulmonary/Chest: Effort normal and breath sounds normal. She has no wheezes. She has no rales. She exhibits no tenderness.  Abdominal: Soft. Bowel sounds are normal. She exhibits no distension and no mass.  There is mild epigastric tenderness.   Musculoskeletal: Normal range of motion.  There is a AV fistula in the L forearm with thrill present. There is 2+ pretibial edema bilaterally.   Lymphadenopathy:    She has no cervical adenopathy.  Neurological: She is alert and oriented to person, place, and time. No cranial nerve deficit. She exhibits normal muscle tone. Coordination normal.  Skin: Skin is warm and dry.  There are multiple lesions present that appear to be well healing pustules.   Psychiatric: She has a normal mood and affect. Her behavior is normal. Judgment and thought content normal.  Nursing note and vitals reviewed.   ED Treatments / Results  DIAGNOSTIC STUDIES: Oxygen Saturation is 98 percent on room air which is normal by my interpretation.    COORDINATION OF CARE: 11:37  PM Discussed treatment plan with pt at bedside and pt agreed to plan.  Labs (all labs ordered are listed, but only abnormal results are displayed) Labs Reviewed  COMPREHENSIVE METABOLIC PANEL - Abnormal; Notable for the following:       Result Value   Sodium 130 (*)    Chloride 90 (*)    Glucose, Bld 583 (*)    BUN 47 (*)    Creatinine, Ser 7.16 (*)    Calcium 7.9 (*)    Albumin 3.0 (*)    Alkaline Phosphatase 146 (*)    GFR calc non Af Amer 6 (*)    GFR calc Af Amer 7 (*)    All other components within normal limits  CBC WITH DIFFERENTIAL/PLATELET - Abnormal; Notable for the following:    WBC 2.8 (*)    RBC 2.65 (*)    Hemoglobin 7.8 (*)    HCT 24.9 (*)    RDW 17.3 (*)    Platelets 118 (*)    All other components within normal limits  LIPASE, BLOOD - Abnormal; Notable for the following:    Lipase 66 (*)    All other components within normal limits    EKG  EKG Interpretation  Date/Time:  Sunday August 25 2016 23:52:29 EST Ventricular Rate:  74 PR Interval:    QRS Duration: 88 QT Interval:  439 QTC Calculation: 488 R Axis:   61 Text Interpretation:  Sinus rhythm Borderline repolarization abnormality Borderline prolonged QT interval When compared with ECG of 07/21/2016, QT has shortened Confirmed by Roxanne Mins  MD, Denesha Brouse ([id number]) on 08/25/2016 11:56:18 PM       Radiology Dg Chest 2 View  Result Date: 08/26/2016 CLINICAL DATA:  Dyspnea EXAM: CHEST  2 VIEW COMPARISON:  07/21/2016 FINDINGS: Stable cardiomegaly with vascular congestion. No aortic aneurysm. No suspicious osseous lesions. Chronic opacity at the left lung base consistent left effusion and  probable compressive atelectasis or pulmonary consolidation. IMPRESSION: Cardiomegaly with mild CHF. Chronic consolidation at the left lung base with probable left effusion. Electronically Signed   By: Ashley Royalty M.D.   On: 08/26/2016 01:22    Procedures Procedures (including critical care time)  Medications Ordered in  ED Medications  alum & mag hydroxide-simeth (MAALOX/MYLANTA) 200-200-20 MG/5ML suspension 30 mL (not administered)  ondansetron (ZOFRAN) tablet 4 mg (not administered)  zolpidem (AMBIEN) tablet 5 mg (not administered)  acetaminophen (TYLENOL) tablet 650 mg (not administered)  LORazepam (ATIVAN) tablet 1 mg (not administered)  nicotine (NICODERM CQ - dosed in mg/24 hours) patch 14 mg (not administered)  gi cocktail (Maalox,Lidocaine,Donnatal) (30 mLs Oral Given 08/26/16 0049)  pantoprazole (PROTONIX) EC tablet 40 mg (40 mg Oral Given 08/26/16 0049)  ondansetron (ZOFRAN-ODT) disintegrating tablet 8 mg (8 mg Oral Given 08/26/16 0049)  insulin aspart (novoLOG) injection 10 Units (10 Units Intravenous Given 08/26/16 0314)     Initial Impression / Assessment and Plan / ED Course  I have reviewed the triage vital signs and the nursing notes.  Pertinent labs & imaging results that were available during my care of the patient were reviewed by me and considered in my medical decision making (see chart for details).  Dialysis patient who states that she missed dialysis and is in need of emergent dialysis. She does show evidence of fluid overload, but lungs are clear and oxygen saturation is normal. Workup is done in the ED showing a clear chest x-ray and no hypokalemia. Anemia is present and unchanged from baseline. Patient was ambulated in the ED and that did not show any evidence of oxygen desaturation. I explained this to the patient and tried to arrange for discharge but she stated that she has mental illness and chronic pain and would not make it back for dialysis and stated that she was suicidal. She stated she wanted to be admitted to [facility] behavioral health. She denied any specific suicidal plan. Will arrange TTS consultation.  TTS consultation appreciated. Patient meets inpatient criteria. She will need to go to a medical psych facility where she can get her dialysis. In the meantime, she is placed in  psychiatric holding. Nephrology will be consult for her dialysis needs.  Final Clinical Impressions(s) / ED Diagnoses   Final diagnoses:  Suicidal ideation  Dyspnea, unspecified type  End-stage renal disease on hemodialysis (HCC)  Anemia in chronic kidney disease, on chronic dialysis [facility])    New Prescriptions New Prescriptions   No medications on file  I personally performed the services described in this documentation, which was scribed in my presence. The recorded information has been reviewed and is accurate.      [name], MD 65/[id number]

## 2016-08-25 NOTE — ED Triage Notes (Signed)
Per [facility]  Pt coming from home. C/o ABD and CP for 2 days. Missed dialysis yesterday. Didn't go due to pain and depression. CBG 595. Ate 1 hr ago. SOB upon ambulation. L arm restricted. No IV access.

## 2016-08-26 ENCOUNTER — Emergency Department (HOSPITAL_COMMUNITY): Payer: Medicare Other

## 2016-08-26 DIAGNOSIS — Z90710 Acquired absence of both cervix and uterus: Secondary | ICD-10-CM | POA: Diagnosis not present

## 2016-08-26 DIAGNOSIS — I132 Hypertensive heart and chronic kidney disease with heart failure and with stage 5 chronic kidney disease, or end stage renal disease: Secondary | ICD-10-CM | POA: Diagnosis not present

## 2016-08-26 DIAGNOSIS — Z880 Allergy status to penicillin: Secondary | ICD-10-CM

## 2016-08-26 DIAGNOSIS — Z888 Allergy status to other drugs, medicaments and biological substances status: Secondary | ICD-10-CM

## 2016-08-26 DIAGNOSIS — F250 Schizoaffective disorder, bipolar type: Secondary | ICD-10-CM | POA: Diagnosis not present

## 2016-08-26 DIAGNOSIS — Z87891 Personal history of nicotine dependence: Secondary | ICD-10-CM

## 2016-08-26 DIAGNOSIS — Z9851 Tubal ligation status: Secondary | ICD-10-CM

## 2016-08-26 DIAGNOSIS — F1910 Other psychoactive substance abuse, uncomplicated: Secondary | ICD-10-CM | POA: Diagnosis not present

## 2016-08-26 DIAGNOSIS — Z79899 Other long term (current) drug therapy: Secondary | ICD-10-CM

## 2016-08-26 DIAGNOSIS — F4321 Adjustment disorder with depressed mood: Secondary | ICD-10-CM | POA: Diagnosis not present

## 2016-08-26 DIAGNOSIS — Z8249 Family history of ischemic heart disease and other diseases of the circulatory system: Secondary | ICD-10-CM

## 2016-08-26 DIAGNOSIS — R45851 Suicidal ideations: Secondary | ICD-10-CM

## 2016-08-26 DIAGNOSIS — Z9889 Other specified postprocedural states: Secondary | ICD-10-CM

## 2016-08-26 LAB — RAPID URINE DRUG SCREEN, HOSP PERFORMED
AMPHETAMINES: NOT DETECTED
Barbiturates: NOT DETECTED
Benzodiazepines: NOT DETECTED
COCAINE: POSITIVE — AB
Opiates: NOT DETECTED
Tetrahydrocannabinol: NOT DETECTED

## 2016-08-26 LAB — COMPREHENSIVE METABOLIC PANEL
ALBUMIN: 3 g/dL — AB (ref 3.5–5.0)
ALT: 24 U/L (ref 14–54)
AST: 26 U/L (ref 15–41)
Alkaline Phosphatase: 146 U/L — ABNORMAL HIGH (ref 38–126)
Anion gap: 15 (ref 5–15)
BUN: 47 mg/dL — AB (ref 6–20)
CHLORIDE: 90 mmol/L — AB (ref 101–111)
CO2: 25 mmol/L (ref 22–32)
CREATININE: 7.16 mg/dL — AB (ref 0.44–1.00)
Calcium: 7.9 mg/dL — ABNORMAL LOW (ref 8.9–10.3)
GFR calc Af Amer: 7 mL/min — ABNORMAL LOW (ref >60)
GFR, EST NON AFRICAN AMERICAN: 6 mL/min — AB (ref >60)
GLUCOSE: 583 mg/dL — AB (ref 65–99)
Potassium: 5.1 mmol/L (ref 3.5–5.1)
Sodium: 130 mmol/L — ABNORMAL LOW (ref 135–145)
Total Bilirubin: 0.8 mg/dL (ref 0.3–1.2)
Total Protein: 6.7 g/dL (ref 6.5–8.1)

## 2016-08-26 LAB — CBC WITH DIFFERENTIAL/PLATELET
Basophils Absolute: 0 10*3/uL (ref 0.0–0.1)
Basophils Relative: 0 %
EOS ABS: 0.1 10*3/uL (ref 0.0–0.7)
Eosinophils Relative: 2 %
HCT: 24.9 % — ABNORMAL LOW (ref 36.0–46.0)
Hemoglobin: 7.8 g/dL — ABNORMAL LOW (ref 12.0–15.0)
LYMPHS ABS: 0.7 10*3/uL (ref 0.7–4.0)
LYMPHS PCT: 24 %
MCH: 29.4 pg (ref 26.0–34.0)
MCHC: 31.3 g/dL (ref 30.0–36.0)
MCV: 94 fL (ref 78.0–100.0)
MONOS PCT: 5 %
Monocytes Absolute: 0.1 10*3/uL (ref 0.1–1.0)
Neutro Abs: 1.9 10*3/uL (ref 1.7–7.7)
Neutrophils Relative %: 69 %
PLATELETS: 118 10*3/uL — AB (ref 150–400)
RBC: 2.65 MIL/uL — AB (ref 3.87–5.11)
RDW: 17.3 % — AB (ref 11.5–15.5)
WBC: 2.8 10*3/uL — AB (ref 4.0–10.5)

## 2016-08-26 LAB — URINALYSIS, ROUTINE W REFLEX MICROSCOPIC
Bilirubin Urine: NEGATIVE
Glucose, UA: >=500 mg/dL — AB
Hgb urine dipstick: NEGATIVE
KETONES UR: NEGATIVE mg/dL
Leukocytes, UA: NEGATIVE
Nitrite: NEGATIVE
PROTEIN: 100 mg/dL — AB
Specific Gravity, Urine: 1.016 (ref 1.005–1.030)
pH: 6 (ref 5.0–8.0)

## 2016-08-26 LAB — CBG MONITORING, ED
GLUCOSE-CAPILLARY: 550 mg/dL — AB (ref 65–99)
Glucose-Capillary: 530 mg/dL (ref 65–99)
Glucose-Capillary: 266 mg/dL — ABNORMAL HIGH (ref 65–99)
Glucose-Capillary: 290 mg/dL — ABNORMAL HIGH (ref 65–99)

## 2016-08-26 LAB — LIPASE, BLOOD: LIPASE: 66 U/L — AB (ref 11–51)

## 2016-08-26 LAB — ETHANOL

## 2016-08-26 MED ORDER — CLONIDINE HCL 0.2 MG PO TABS
0.1000 mg | ORAL_TABLET | Freq: Three times a day (TID) | ORAL | Status: DC
  Administered 2016-08-26 – 2016-08-28 (×6): 0.1 mg via ORAL
  Filled 2016-08-26 (×6): qty 1

## 2016-08-26 MED ORDER — INSULIN GLARGINE 100 UNIT/ML ~~LOC~~ SOLN
25.0000 [IU] | Freq: Every day | SUBCUTANEOUS | Status: DC
  Administered 2016-08-26 – 2016-08-27 (×2): 25 [IU] via SUBCUTANEOUS
  Filled 2016-08-26 (×3): qty 0.25

## 2016-08-26 MED ORDER — CALCIUM ACETATE (PHOS BINDER) 667 MG PO CAPS
1334.0000 mg | ORAL_CAPSULE | Freq: Three times a day (TID) | ORAL | Status: DC
  Administered 2016-08-26 – 2016-08-28 (×7): 1334 mg via ORAL
  Filled 2016-08-26 (×10): qty 2

## 2016-08-26 MED ORDER — NICOTINE 14 MG/24HR TD PT24: 14.0000 mg | MEDICATED_PATCH | Freq: Once | TRANSDERMAL | Status: DC

## 2016-08-26 MED ORDER — ACETAMINOPHEN 325 MG PO TABS
650.0000 mg | ORAL_TABLET | ORAL | Status: DC | PRN
  Administered 2016-08-26: 650 mg via ORAL
  Filled 2016-08-26: qty 2

## 2016-08-26 MED ORDER — ONDANSETRON HCL 4 MG PO TABS: 4.0000 mg | ORAL_TABLET | Freq: Three times a day (TID) | ORAL | Status: DC | PRN

## 2016-08-26 MED ORDER — RENA-VITE PO TABS
1.0000 | ORAL_TABLET | Freq: Every day | ORAL | Status: DC
  Administered 2016-08-26 – 2016-08-27 (×2): 1 via ORAL
  Filled 2016-08-26 (×2): qty 1

## 2016-08-26 MED ORDER — SEVELAMER CARBONATE 800 MG PO TABS
2400.0000 mg | ORAL_TABLET | Freq: Three times a day (TID) | ORAL | Status: DC
  Administered 2016-08-26 – 2016-08-28 (×7): 2400 mg via ORAL
  Filled 2016-08-26 (×12): qty 3

## 2016-08-26 MED ORDER — INSULIN ASPART 100 UNIT/ML ~~LOC~~ SOLN
20.0000 [IU] | Freq: Once | SUBCUTANEOUS | Status: AC
  Administered 2016-08-26: 20 [IU] via SUBCUTANEOUS
  Filled 2016-08-26: qty 1

## 2016-08-26 MED ORDER — ZOLPIDEM TARTRATE 5 MG PO TABS
5.0000 mg | ORAL_TABLET | Freq: Every evening | ORAL | Status: DC | PRN
  Administered 2016-08-27: 5 mg via ORAL
  Filled 2016-08-26: qty 1

## 2016-08-26 MED ORDER — PRO-STAT SUGAR FREE PO LIQD
30.0000 mL | Freq: Two times a day (BID) | ORAL | Status: DC
  Administered 2016-08-26 – 2016-08-27 (×3): 30 mL via ORAL
  Administered 2016-08-28: 01:00:00 via ORAL
  Administered 2016-08-28: 30 mL via ORAL
  Filled 2016-08-26 (×8): qty 30

## 2016-08-26 MED ORDER — SENNOSIDES-DOCUSATE SODIUM 8.6-50 MG PO TABS
1.0000 | ORAL_TABLET | Freq: Two times a day (BID) | ORAL | Status: DC
  Administered 2016-08-26 – 2016-08-28 (×5): 1 via ORAL
  Filled 2016-08-26 (×9): qty 1

## 2016-08-26 MED ORDER — CARVEDILOL 12.5 MG PO TABS
25.0000 mg | ORAL_TABLET | Freq: Two times a day (BID) | ORAL | Status: DC
  Administered 2016-08-26 – 2016-08-28 (×5): 25 mg via ORAL
  Filled 2016-08-26 (×5): qty 2

## 2016-08-26 MED ORDER — LORAZEPAM 1 MG PO TABS
1.0000 mg | ORAL_TABLET | Freq: Three times a day (TID) | ORAL | Status: DC | PRN
  Administered 2016-08-26 – 2016-08-28 (×4): 1 mg via ORAL
  Filled 2016-08-26 (×4): qty 1

## 2016-08-26 MED ORDER — FUROSEMIDE 20 MG PO TABS
120.0000 mg | ORAL_TABLET | Freq: Every day | ORAL | Status: DC
  Administered 2016-08-26 – 2016-08-28 (×3): 120 mg via ORAL
  Filled 2016-08-26 (×3): qty 6

## 2016-08-26 MED ORDER — POLYVINYL ALCOHOL 1.4 % OP SOLN: 2.0000 [drp] | Freq: Four times a day (QID) | OPHTHALMIC | Status: DC | PRN

## 2016-08-26 MED ORDER — DOXERCALCIFEROL 4 MCG/2ML IV SOLN
1.0000 ug | INTRAVENOUS | Status: DC
  Administered 2016-08-26: 1 ug via INTRAVENOUS
  Filled 2016-08-26: qty 2

## 2016-08-26 MED ORDER — HYDROCODONE-ACETAMINOPHEN 5-325 MG PO TABS
1.0000 | ORAL_TABLET | Freq: Four times a day (QID) | ORAL | Status: DC | PRN
  Administered 2016-08-26: 1 via ORAL
  Administered 2016-08-27 – 2016-08-28 (×3): 2 via ORAL
  Filled 2016-08-26: qty 2
  Filled 2016-08-26: qty 1
  Filled 2016-08-26 (×2): qty 2

## 2016-08-26 MED ORDER — DOXERCALCIFEROL 4 MCG/2ML IV SOLN
INTRAVENOUS | Status: AC
  Administered 2016-08-26: 1 ug via INTRAVENOUS
  Filled 2016-08-26: qty 2

## 2016-08-26 MED ORDER — DIPHENHYDRAMINE HCL 25 MG PO CAPS
25.0000 mg | ORAL_CAPSULE | Freq: Four times a day (QID) | ORAL | Status: DC | PRN
  Administered 2016-08-26 – 2016-08-28 (×2): 25 mg via ORAL
  Filled 2016-08-26: qty 1

## 2016-08-26 MED ORDER — FOLIC ACID 1 MG PO TABS
1.0000 mg | ORAL_TABLET | Freq: Two times a day (BID) | ORAL | Status: DC
  Administered 2016-08-26 – 2016-08-28 (×5): 1 mg via ORAL
  Filled 2016-08-26 (×5): qty 1

## 2016-08-26 MED ORDER — INSULIN ASPART 100 UNIT/ML ~~LOC~~ SOLN: 2.0000 [IU] | Freq: Three times a day (TID) | SUBCUTANEOUS | Status: DC

## 2016-08-26 MED ORDER — DIPHENHYDRAMINE HCL 25 MG PO CAPS
ORAL_CAPSULE | ORAL | Status: AC
  Administered 2016-08-26: 25 mg via ORAL
  Filled 2016-08-26: qty 1

## 2016-08-26 MED ORDER — AMLODIPINE BESYLATE 5 MG PO TABS
10.0000 mg | ORAL_TABLET | Freq: Every day | ORAL | Status: DC
Start: 2016-08-26 — End: 2016-08-28
  Administered 2016-08-26 – 2016-08-27 (×2): 10 mg via ORAL
  Filled 2016-08-26 (×2): qty 2

## 2016-08-26 MED ORDER — POLYETHYLENE GLYCOL 3350 17 G PO PACK
17.0000 g | PACK | Freq: Every day | ORAL | Status: DC
  Administered 2016-08-26 – 2016-08-28 (×3): 17 g via ORAL
  Filled 2016-08-26 (×3): qty 1

## 2016-08-26 MED ORDER — INSULIN ASPART 100 UNIT/ML ~~LOC~~ SOLN
0.0000 [IU] | Freq: Three times a day (TID) | SUBCUTANEOUS | Status: DC
  Administered 2016-08-26: 8 [IU] via SUBCUTANEOUS
  Administered 2016-08-28: 5 [IU] via SUBCUTANEOUS
  Administered 2016-08-28: 8 [IU] via SUBCUTANEOUS
  Filled 2016-08-26 (×4): qty 1

## 2016-08-26 MED ORDER — ALUM & MAG HYDROXIDE-SIMETH 200-200-20 MG/5ML PO SUSP: 30.0000 mL | ORAL | Status: DC | PRN

## 2016-08-26 MED ORDER — ATORVASTATIN CALCIUM 40 MG PO TABS
40.0000 mg | ORAL_TABLET | Freq: Every day | ORAL | Status: DC
  Administered 2016-08-26 – 2016-08-27 (×2): 40 mg via ORAL
  Filled 2016-08-26 (×2): qty 1

## 2016-08-26 MED ORDER — INSULIN ASPART 100 UNIT/ML IV SOLN
10.0000 [IU] | Freq: Once | INTRAVENOUS | Status: AC
Start: 2016-08-26 — End: 2016-08-26
  Administered 2016-08-26: 10 [IU] via INTRAVENOUS

## 2016-08-26 NOTE — ED Notes (Signed)
Pt refusing to stay on cardiac/monitoring due to scratching/picking at arms and trunk.

## 2016-08-26 NOTE — ED Triage Notes (Signed)
Pt returned to room from dialysis.Pt was assisted to hospital bed in room.

## 2016-08-26 NOTE — BHH Counselor (Signed)
Per Silva Bandy RN at [facility] inpatient- pt blood sugars are too high for admission after review by Dr. Jerilee Hoh. Pt must have levels below 350 to be considered at [facility] unit.  [address] [place], [place]

## 2016-08-26 NOTE — ED Provider Notes (Addendum)
Dr Jonnie Finner is aware of patient's need for dialysis and will dialyze while they are in ED as a psych holding patient   Sherwood Gambler, MD 08/26/16 0830  Psych looking for placement, likely placement today.   Sherwood Gambler, MD 08/26/16 1250

## 2016-08-26 NOTE — ED Notes (Signed)
While ambulating pt pulse ox and heart rate remained WNL. Pt reports that she wants something to eat and if she was to be discharged home she wants to be seen by mental health. Pt reports, "If I go home I will die because I can't breathe when bend over or when trying to walk to the bus stop." "I need one of those breathing machines that you put the medicine in." Dr Roxanne Mins made aware of pt remarks.
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
## 2016-08-26 NOTE — ED Notes (Signed)
Medications ordered from pharmacy.

## 2016-08-26 NOTE — ED Notes (Signed)
Contacted Staffing regarding SI sitter placement - states sitter will be staffed at 1100 to replace [place] staff sitter.
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
## 2016-08-26 NOTE — Procedures (Addendum)
ESRD pt holding in ED for inpatient psych admission, asked to see for regular HD.  MWF schedule.  HD orders written.     I was present at this dialysis session, have reviewed the session itself and made  appropriate changes Kelly Splinter MD [place] pager [phone]   08/26/2016, 2:48 PM

## 2016-08-26 NOTE — ED Triage Notes (Signed)
PT off the floor for dialysis. 1200 and 1400 meds not given.

## 2016-08-26 NOTE — ED Notes (Signed)
Reported to nnurse that pt.bloodsugar is 550mg .

## 2016-08-26 NOTE — ED Triage Notes (Signed)
PT is in Dialysis for treatment.

## 2016-08-26 NOTE — ED Notes (Signed)
Pt blood glucose 209, Dr. Roxanne Mins made aware.

## 2016-08-27 ENCOUNTER — Emergency Department (HOSPITAL_COMMUNITY): Payer: Medicare Other

## 2016-08-27 DIAGNOSIS — I132 Hypertensive heart and chronic kidney disease with heart failure and with stage 5 chronic kidney disease, or end stage renal disease: Secondary | ICD-10-CM | POA: Diagnosis not present

## 2016-08-27 LAB — RENAL FUNCTION PANEL
Albumin: 3.1 g/dL — ABNORMAL LOW (ref 3.5–5.0)
Anion gap: 14 (ref 5–15)
BUN: 17 mg/dL (ref 6–20)
CALCIUM: 8.3 mg/dL — AB (ref 8.9–10.3)
CHLORIDE: 94 mmol/L — AB (ref 101–111)
CO2: 27 mmol/L (ref 22–32)
CREATININE: 3.14 mg/dL — AB (ref 0.44–1.00)
GFR, EST AFRICAN AMERICAN: 19 mL/min — AB (ref >60)
GFR, EST NON AFRICAN AMERICAN: 16 mL/min — AB (ref >60)
Glucose, Bld: 293 mg/dL — ABNORMAL HIGH (ref 65–99)
PHOSPHORUS: 3 mg/dL (ref 2.5–4.6)
Potassium: 3.9 mmol/L (ref 3.5–5.1)
Sodium: 135 mmol/L (ref 135–145)

## 2016-08-27 LAB — CBC
HCT: 22.2 % — ABNORMAL LOW (ref 36.0–46.0)
Hemoglobin: 6.9 g/dL — CL (ref 12.0–15.0)
MCH: 29.5 pg (ref 26.0–34.0)
MCHC: 31.1 g/dL (ref 30.0–36.0)
MCV: 94.9 fL (ref 78.0–100.0)
PLATELETS: 120 10*3/uL — AB (ref 150–400)
RBC: 2.34 MIL/uL — AB (ref 3.87–5.11)
RDW: 17.2 % — ABNORMAL HIGH (ref 11.5–15.5)
WBC: 2.7 10*3/uL — ABNORMAL LOW (ref 4.0–10.5)

## 2016-08-27 LAB — CBG MONITORING, ED
GLUCOSE-CAPILLARY: 143 mg/dL — AB (ref 65–99)
GLUCOSE-CAPILLARY: 113 mg/dL — AB (ref 65–99)
Glucose-Capillary: 483 mg/dL — ABNORMAL HIGH (ref 65–99)

## 2016-08-27 MED ORDER — LIDOCAINE HCL (PF) 1 % IJ SOLN: 5.0000 mL | INTRAMUSCULAR | Status: DC | PRN

## 2016-08-27 MED ORDER — HEPARIN SODIUM (PORCINE) 1000 UNIT/ML DIALYSIS
1000.0000 [IU] | INTRAMUSCULAR | Status: DC | PRN
  Filled 2016-08-27: qty 1

## 2016-08-27 MED ORDER — SODIUM CHLORIDE 0.9 % IV SOLN: 100.0000 mL | INTRAVENOUS | Status: DC | PRN

## 2016-08-27 MED ORDER — PENTAFLUOROPROP-TETRAFLUOROETH EX AERO: 1.0000 "application " | INHALATION_SPRAY | CUTANEOUS | Status: DC | PRN

## 2016-08-27 MED ORDER — INSULIN ASPART 100 UNIT/ML ~~LOC~~ SOLN
25.0000 [IU] | Freq: Once | SUBCUTANEOUS | Status: AC
  Administered 2016-08-27: 25 [IU] via SUBCUTANEOUS

## 2016-08-27 MED ORDER — HEPARIN SODIUM (PORCINE) 1000 UNIT/ML DIALYSIS
20.0000 [IU]/kg | INTRAMUSCULAR | Status: DC | PRN
  Filled 2016-08-27: qty 2

## 2016-08-27 MED ORDER — ALTEPLASE 2 MG IJ SOLR: 2.0000 mg | Freq: Once | INTRAMUSCULAR | Status: DC | PRN

## 2016-08-27 MED ORDER — LIDOCAINE-PRILOCAINE 2.5-2.5 % EX CREA
1.0000 "application " | TOPICAL_CREAM | CUTANEOUS | Status: DC | PRN
  Filled 2016-08-27: qty 5

## 2016-08-27 MED ORDER — DARBEPOETIN ALFA 200 MCG/0.4ML IJ SOSY: 200.0000 ug | PREFILLED_SYRINGE | INTRAMUSCULAR | Status: DC

## 2016-08-27 NOTE — ED Notes (Signed)
Renal Diet was ordered for Lunch, But a  perferred  Meal was listed at Pt. request.

## 2016-08-27 NOTE — ED Notes (Signed)
Dr Roxanne Mins aware pt's CBG 935 after eating 25% of breakfast. Order received for Novolog 25 units now. Readback for verification.

## 2016-08-27 NOTE — ED Notes (Signed)
Pt returned from dialysis. Pt alert, oriented, calm, cooperative. Pt eating meal.

## 2016-08-27 NOTE — ED Notes (Signed)
Pt on phone at nurses' desk. 

## 2016-08-27 NOTE — Care Management (Signed)
Patient awaiting placement.

## 2016-08-27 NOTE — ED Notes (Signed)
Report from [place], Dialysis - pt was on machine x 3.5 hours - 3.5 liters of fluid removed. BP 15/74, HR 71.

## 2016-08-27 NOTE — ED Notes (Signed)
Pt aware may possibly be accepted to [facility] once CBG is less than 350 for 24 hours.

## 2016-08-27 NOTE — ED Notes (Signed)
Pt returned from xray

## 2016-08-27 NOTE — ED Notes (Signed)
Pt being transported to x-ray via w/c.

## 2016-08-27 NOTE — ED Notes (Addendum)
Contacted Ashley at [facility] regarding pt's transfer to [facility] for continued care. Orders for [facility] are in pt's Signed & Held at this time, but per Caryl Pina, pt has not been accepted by [facility] at this time. States she will attempt to find out more when she is able. If unable, will follow up with social worker in the AM.

## 2016-08-27 NOTE — ED Notes (Signed)
Per Dane, RN - dialysis - performing pt's labs ordered at 1320 while pt in dialysis - renal function panel and CBC. Saa, Phlebotomist, aware.

## 2016-08-27 NOTE — ED Notes (Signed)
Apple sauce given as requested d/t being given insulin and Renvela.

## 2016-08-27 NOTE — ED Notes (Signed)
Dr Roderic Palau assessed pt's toe - order received to cleanse it twice daily, apply ointment and cover w/bandage. Pt voices agreement and understanding of tx plan.

## 2016-08-27 NOTE — ED Notes (Signed)
Pt woke up and reported pain to the RN. Mostly chronic generalized pain, but most specifically a headache and R arm pain.

## 2016-08-27 NOTE — ED Notes (Signed)
Pt being transported to dialysis. Sandwich sent w/pt d/t will miss lunch.

## 2016-08-27 NOTE — Progress Notes (Signed)
[facility] Schedule / [facility].= [place]   3.5 hrs  TTS  UF Profile 2   EDW 64.5 kg ( has not made in Jan BUT Only 2 complete txs in Jan)                                                                       Bath= 2k, 2.25 ca    No Hep. Hectorol 1 mcg  / Mircera 225 mcg  q 2wks  (last given 08/1116)    Ernest Haber, PA-C [facility] Beeper [phone] 08/27/2016,8:51 AM  LOS: 0 days   Pt seen, examined and agree w A/P as above.  Kelly Splinter MD [employer] pager [phone]   08/27/2016, 3:49 PM    Labs: Basic Metabolic Panel:  Recent Labs Lab 08/26/16 0005  NA 130*  K 5.1  CL 90*  CO2 25  GLUCOSE 583*  BUN 47*  CREATININE 7.16*  CALCIUM 7.9*   Liver Function Tests:  Recent Labs Lab 08/26/16 0005  AST 26  ALT 24  ALKPHOS 146*  BILITOT 0.8  PROT 6.7  ALBUMIN 3.0*    Recent Labs Lab 08/26/16 0005  LIPASE 66*   No results for input(s): AMMONIA in the last 168 hours. CBC:  Recent Labs Lab 08/26/16 0005  WBC 2.8*  NEUTROABS 1.9  HGB 7.8*  HCT 24.9*  MCV 94.0  PLT 118*   Cardiac Enzymes: No results for input(s): CKTOTAL, CKMB, CKMBINDEX, TROPONINI in the last 168 hours. CBG:  Recent Labs Lab 08/26/16 0754 08/26/16 0856 08/26/16 2052 08/26/16 2207 08/27/16 0807  GLUCAP 530* 550* 266* 290* 483*    Studies/Results: Dg Chest 2 View  Result Date: 08/26/2016 CLINICAL DATA:  Dyspnea EXAM: CHEST  2 VIEW COMPARISON:  07/21/2016 FINDINGS: Stable cardiomegaly with vascular congestion. No aortic aneurysm. No suspicious osseous lesions. Chronic opacity at the left lung base consistent left effusion and probable compressive atelectasis or pulmonary consolidation. IMPRESSION: Cardiomegaly with mild CHF. Chronic consolidation at the left lung base with probable left effusion. Electronically Signed   By: Ashley Royalty M.D.   On: 08/26/2016 01:22   Medications:  . amLODipine  10 mg Oral QHS  . atorvastatin  40 mg Oral QHS  .  calcium acetate  1,334 mg Oral TID WC  . carvedilol  25 mg Oral BID WC  . cloNIDine  0.1 mg Oral Q8H  . doxercalciferol  1 mcg Intravenous Q M,W,F-HD  . feeding supplement (PRO-STAT SUGAR FREE 64)  30 mL Oral BID  . folic acid  1 mg Oral BID WC  . furosemide  120 mg Oral Daily  . insulin aspart  0-15 Units Subcutaneous TID WC  . insulin glargine  25 Units Subcutaneous QHS  . multivitamin  1 tablet Oral QHS  . nicotine  14 mg Transdermal Once  . polyethylene glycol  17 g Oral Daily  . senna-docusate  1 tablet Oral BID  . sevelamer carbonate  2,400 mg Oral TID WC

## 2016-08-27 NOTE — Procedures (Signed)
  I was present at this dialysis session, have reviewed the session itself and made  appropriate changes Kelly Splinter MD [place] pager [phone]   08/27/2016, 3:50 PM

## 2016-08-27 NOTE — ED Notes (Signed)
Requested Pharm Tech to review pt's meds as pt stated she does not take Norco - states she takes Percocet 10mg  w/o Acetaminophen. Pharm Tech reviewed w/pt - states pt has not taken Percocet in over 1 month and she removed Norco from med list.

## 2016-08-27 NOTE — ED Notes (Signed)
Offered pt to shower x 2. States she will wash herself off.

## 2016-08-27 NOTE — ED Notes (Addendum)
Dr Roderic Palau aware pt asking for medicine for open area to right great toe - no bleeding noted.

## 2016-08-28 ENCOUNTER — Inpatient Hospital Stay
Admission: AD | Admit: 2016-08-28 | Discharge: 2016-09-04 | DRG: 881 | Disposition: A | Discharge status: Home / Self Care | Payer: Medicare Other | Source: Intra-hospital | Attending: Psychiatry | Admitting: Psychiatry

## 2016-08-28 DIAGNOSIS — G4710 Hypersomnia, unspecified: Secondary | ICD-10-CM | POA: Diagnosis present

## 2016-08-28 DIAGNOSIS — F1420 Cocaine dependence, uncomplicated: Secondary | ICD-10-CM | POA: Diagnosis present

## 2016-08-28 DIAGNOSIS — Z62810 Personal history of physical and sexual abuse in childhood: Secondary | ICD-10-CM | POA: Diagnosis present

## 2016-08-28 DIAGNOSIS — L97511 Non-pressure chronic ulcer of other part of right foot limited to breakdown of skin: Secondary | ICD-10-CM | POA: Diagnosis present

## 2016-08-28 DIAGNOSIS — I5042 Chronic combined systolic (congestive) and diastolic (congestive) heart failure: Secondary | ICD-10-CM | POA: Diagnosis present

## 2016-08-28 DIAGNOSIS — E1165 Type 2 diabetes mellitus with hyperglycemia: Secondary | ICD-10-CM | POA: Diagnosis present

## 2016-08-28 DIAGNOSIS — E11621 Type 2 diabetes mellitus with foot ulcer: Secondary | ICD-10-CM | POA: Diagnosis present

## 2016-08-28 DIAGNOSIS — G894 Chronic pain syndrome: Secondary | ICD-10-CM | POA: Diagnosis present

## 2016-08-28 DIAGNOSIS — E559 Vitamin D deficiency, unspecified: Secondary | ICD-10-CM | POA: Diagnosis present

## 2016-08-28 DIAGNOSIS — Z915 Personal history of self-harm: Secondary | ICD-10-CM

## 2016-08-28 DIAGNOSIS — Z8639 Personal history of other endocrine, nutritional and metabolic disease: Secondary | ICD-10-CM

## 2016-08-28 DIAGNOSIS — Z880 Allergy status to penicillin: Secondary | ICD-10-CM

## 2016-08-28 DIAGNOSIS — N189 Chronic kidney disease, unspecified: Secondary | ICD-10-CM

## 2016-08-28 DIAGNOSIS — R2681 Unsteadiness on feet: Secondary | ICD-10-CM | POA: Diagnosis present

## 2016-08-28 DIAGNOSIS — Z794 Long term (current) use of insulin: Secondary | ICD-10-CM

## 2016-08-28 DIAGNOSIS — G4700 Insomnia, unspecified: Secondary | ICD-10-CM | POA: Diagnosis present

## 2016-08-28 DIAGNOSIS — E7800 Pure hypercholesterolemia, unspecified: Secondary | ICD-10-CM | POA: Diagnosis present

## 2016-08-28 DIAGNOSIS — F329 Major depressive disorder, single episode, unspecified: Principal | ICD-10-CM | POA: Diagnosis present

## 2016-08-28 DIAGNOSIS — H547 Unspecified visual loss: Secondary | ICD-10-CM | POA: Diagnosis present

## 2016-08-28 DIAGNOSIS — F250 Schizoaffective disorder, bipolar type: Secondary | ICD-10-CM

## 2016-08-28 DIAGNOSIS — Z9111 Patient's noncompliance with dietary regimen: Secondary | ICD-10-CM

## 2016-08-28 DIAGNOSIS — D509 Iron deficiency anemia, unspecified: Secondary | ICD-10-CM | POA: Diagnosis present

## 2016-08-28 DIAGNOSIS — Z91199 Patient's noncompliance with other medical treatment and regimen due to unspecified reason: Secondary | ICD-10-CM

## 2016-08-28 DIAGNOSIS — R569 Unspecified convulsions: Secondary | ICD-10-CM | POA: Diagnosis present

## 2016-08-28 DIAGNOSIS — N2581 Secondary hyperparathyroidism of renal origin: Secondary | ICD-10-CM | POA: Diagnosis present

## 2016-08-28 DIAGNOSIS — L97509 Non-pressure chronic ulcer of other part of unspecified foot with unspecified severity: Secondary | ICD-10-CM

## 2016-08-28 DIAGNOSIS — F17210 Nicotine dependence, cigarettes, uncomplicated: Secondary | ICD-10-CM | POA: Diagnosis present

## 2016-08-28 DIAGNOSIS — E1140 Type 2 diabetes mellitus with diabetic neuropathy, unspecified: Secondary | ICD-10-CM | POA: Diagnosis present

## 2016-08-28 DIAGNOSIS — K219 Gastro-esophageal reflux disease without esophagitis: Secondary | ICD-10-CM | POA: Diagnosis present

## 2016-08-28 DIAGNOSIS — F419 Anxiety disorder, unspecified: Secondary | ICD-10-CM | POA: Diagnosis present

## 2016-08-28 DIAGNOSIS — E119 Type 2 diabetes mellitus without complications: Secondary | ICD-10-CM

## 2016-08-28 DIAGNOSIS — Z91410 Personal history of adult physical and sexual abuse: Secondary | ICD-10-CM

## 2016-08-28 DIAGNOSIS — D631 Anemia in chronic kidney disease: Secondary | ICD-10-CM | POA: Diagnosis present

## 2016-08-28 DIAGNOSIS — H409 Unspecified glaucoma: Secondary | ICD-10-CM | POA: Diagnosis present

## 2016-08-28 DIAGNOSIS — E1169 Type 2 diabetes mellitus with other specified complication: Secondary | ICD-10-CM | POA: Diagnosis present

## 2016-08-28 DIAGNOSIS — L97209 Non-pressure chronic ulcer of unspecified calf with unspecified severity: Secondary | ICD-10-CM

## 2016-08-28 DIAGNOSIS — L97211 Non-pressure chronic ulcer of right calf limited to breakdown of skin: Secondary | ICD-10-CM | POA: Diagnosis present

## 2016-08-28 DIAGNOSIS — E11649 Type 2 diabetes mellitus with hypoglycemia without coma: Secondary | ICD-10-CM | POA: Diagnosis not present

## 2016-08-28 DIAGNOSIS — Z90710 Acquired absence of both cervix and uterus: Secondary | ICD-10-CM

## 2016-08-28 DIAGNOSIS — N186 End stage renal disease: Secondary | ICD-10-CM | POA: Diagnosis present

## 2016-08-28 DIAGNOSIS — E11622 Type 2 diabetes mellitus with other skin ulcer: Secondary | ICD-10-CM | POA: Diagnosis present

## 2016-08-28 DIAGNOSIS — E1122 Type 2 diabetes mellitus with diabetic chronic kidney disease: Secondary | ICD-10-CM | POA: Diagnosis present

## 2016-08-28 DIAGNOSIS — R45851 Suicidal ideations: Secondary | ICD-10-CM | POA: Diagnosis present

## 2016-08-28 DIAGNOSIS — Z992 Dependence on renal dialysis: Secondary | ICD-10-CM

## 2016-08-28 DIAGNOSIS — Z9115 Patient's noncompliance with renal dialysis: Secondary | ICD-10-CM

## 2016-08-28 DIAGNOSIS — N185 Chronic kidney disease, stage 5: Secondary | ICD-10-CM | POA: Diagnosis present

## 2016-08-28 DIAGNOSIS — I639 Cerebral infarction, unspecified: Secondary | ICD-10-CM

## 2016-08-28 DIAGNOSIS — F332 Major depressive disorder, recurrent severe without psychotic features: Secondary | ICD-10-CM | POA: Diagnosis not present

## 2016-08-28 DIAGNOSIS — Z8614 Personal history of Methicillin resistant Staphylococcus aureus infection: Secondary | ICD-10-CM

## 2016-08-28 DIAGNOSIS — B1920 Unspecified viral hepatitis C without hepatic coma: Secondary | ICD-10-CM

## 2016-08-28 DIAGNOSIS — I132 Hypertensive heart and chronic kidney disease with heart failure and with stage 5 chronic kidney disease, or end stage renal disease: Secondary | ICD-10-CM | POA: Diagnosis present

## 2016-08-28 DIAGNOSIS — Z531 Procedure and treatment not carried out because of patient's decision for reasons of belief and group pressure: Secondary | ICD-10-CM | POA: Diagnosis present

## 2016-08-28 DIAGNOSIS — Z8673 Personal history of transient ischemic attack (TIA), and cerebral infarction without residual deficits: Secondary | ICD-10-CM

## 2016-08-28 DIAGNOSIS — Z8249 Family history of ischemic heart disease and other diseases of the circulatory system: Secondary | ICD-10-CM

## 2016-08-28 DIAGNOSIS — I959 Hypotension, unspecified: Secondary | ICD-10-CM | POA: Diagnosis not present

## 2016-08-28 DIAGNOSIS — Z79899 Other long term (current) drug therapy: Secondary | ICD-10-CM

## 2016-08-28 DIAGNOSIS — H5460 Unqualified visual loss, one eye, unspecified: Secondary | ICD-10-CM | POA: Diagnosis present

## 2016-08-28 DIAGNOSIS — Z9114 Patient's other noncompliance with medication regimen: Secondary | ICD-10-CM

## 2016-08-28 DIAGNOSIS — Z89432 Acquired absence of left foot: Secondary | ICD-10-CM

## 2016-08-28 DIAGNOSIS — Z888 Allergy status to other drugs, medicaments and biological substances status: Secondary | ICD-10-CM

## 2016-08-28 DIAGNOSIS — F4321 Adjustment disorder with depressed mood: Secondary | ICD-10-CM

## 2016-08-28 DIAGNOSIS — G40909 Epilepsy, unspecified, not intractable, without status epilepticus: Secondary | ICD-10-CM

## 2016-08-28 DIAGNOSIS — Z9119 Patient's noncompliance with other medical treatment and regimen: Secondary | ICD-10-CM

## 2016-08-28 DIAGNOSIS — E785 Hyperlipidemia, unspecified: Secondary | ICD-10-CM | POA: Diagnosis present

## 2016-08-28 LAB — CBG MONITORING, ED
GLUCOSE-CAPILLARY: 596 mg/dL — AB (ref 65–99)
GLUCOSE-CAPILLARY: 286 mg/dL — AB (ref 65–99)
Glucose-Capillary: 589 mg/dL (ref 65–99)
Glucose-Capillary: 75 mg/dL (ref 65–99)
Glucose-Capillary: 213 mg/dL — ABNORMAL HIGH (ref 65–99)
Glucose-Capillary: 279 mg/dL — ABNORMAL HIGH (ref 65–99)

## 2016-08-28 LAB — HEPATITIS B SURFACE ANTIGEN: Hepatitis B Surface Ag: NEGATIVE

## 2016-08-28 LAB — GLUCOSE, CAPILLARY: Glucose-Capillary: 255 mg/dL — ABNORMAL HIGH (ref 65–99)

## 2016-08-28 MED ORDER — ZOLPIDEM TARTRATE 5 MG PO TABS
5.0000 mg | ORAL_TABLET | Freq: Every evening | ORAL | Status: DC | PRN
  Filled 2016-08-28: qty 1

## 2016-08-28 MED ORDER — INSULIN ASPART 100 UNIT/ML ~~LOC~~ SOLN
0.0000 [IU] | Freq: Every day | SUBCUTANEOUS | Status: DC
  Administered 2016-08-28: 3 [IU] via SUBCUTANEOUS
  Administered 2016-09-02: 8 [IU] via SUBCUTANEOUS
  Administered 2016-09-03: 2 [IU] via SUBCUTANEOUS
  Filled 2016-08-28: qty 3
  Filled 2016-08-28: qty 8
  Filled 2016-08-28: qty 2
  Filled 2016-08-28: qty 3

## 2016-08-28 MED ORDER — INSULIN ASPART 100 UNIT/ML ~~LOC~~ SOLN
20.0000 [IU] | Freq: Once | SUBCUTANEOUS | Status: AC
  Administered 2016-08-28: 20 [IU] via SUBCUTANEOUS
  Filled 2016-08-28: qty 1

## 2016-08-28 MED ORDER — PRO-STAT SUGAR FREE PO LIQD
30.0000 mL | Freq: Two times a day (BID) | ORAL | Status: DC
  Administered 2016-08-29 – 2016-09-04 (×12): 30 mL via ORAL

## 2016-08-28 MED ORDER — OXYCODONE-ACETAMINOPHEN 5-325 MG PO TABS: 1.0000 | ORAL_TABLET | Freq: Three times a day (TID) | ORAL | Status: DC | PRN

## 2016-08-28 MED ORDER — CLONIDINE HCL 0.1 MG PO TABS
0.1000 mg | ORAL_TABLET | Freq: Three times a day (TID) | ORAL | Status: DC
  Administered 2016-08-29 – 2016-09-04 (×15): 0.1 mg via ORAL
  Filled 2016-08-28 (×16): qty 1

## 2016-08-28 MED ORDER — AMLODIPINE BESYLATE 5 MG PO TABS
10.0000 mg | ORAL_TABLET | Freq: Every day | ORAL | Status: DC
  Administered 2016-08-29 – 2016-09-03 (×6): 10 mg via ORAL
  Filled 2016-08-28 (×7): qty 2

## 2016-08-28 MED ORDER — FUROSEMIDE 40 MG PO TABS
120.0000 mg | ORAL_TABLET | Freq: Every day | ORAL | Status: DC
  Administered 2016-08-29: 120 mg via ORAL
  Filled 2016-08-28: qty 3

## 2016-08-28 MED ORDER — INSULIN ASPART 100 UNIT/ML ~~LOC~~ SOLN: 20.0000 [IU] | Freq: Once | SUBCUTANEOUS | Status: DC

## 2016-08-28 MED ORDER — CALCIUM ACETATE (PHOS BINDER) 667 MG PO CAPS
1334.0000 mg | ORAL_CAPSULE | Freq: Three times a day (TID) | ORAL | Status: DC
  Administered 2016-08-29 – 2016-09-04 (×18): 1334 mg via ORAL
  Filled 2016-08-28 (×20): qty 2

## 2016-08-28 MED ORDER — ALTEPLASE 2 MG IJ SOLR: 2.0000 mg | Freq: Once | INTRAMUSCULAR | Status: DC | PRN

## 2016-08-28 MED ORDER — INSULIN ASPART 100 UNIT/ML ~~LOC~~ SOLN
25.0000 [IU] | Freq: Once | SUBCUTANEOUS | Status: AC
  Administered 2016-08-28: 25 [IU] via SUBCUTANEOUS
  Filled 2016-08-28: qty 1

## 2016-08-28 MED ORDER — ONDANSETRON HCL 4 MG PO TABS
4.0000 mg | ORAL_TABLET | Freq: Three times a day (TID) | ORAL | Status: DC | PRN
Start: 2016-08-28 — End: 2016-09-03

## 2016-08-28 MED ORDER — NICOTINE 14 MG/24HR TD PT24: 14.0000 mg | MEDICATED_PATCH | Freq: Every day | TRANSDERMAL | Status: DC

## 2016-08-28 MED ORDER — INSULIN ASPART 100 UNIT/ML ~~LOC~~ SOLN
3.0000 [IU] | Freq: Three times a day (TID) | SUBCUTANEOUS | Status: DC
  Administered 2016-08-29: 3 [IU] via SUBCUTANEOUS
  Filled 2016-08-28: qty 3

## 2016-08-28 MED ORDER — INSULIN ASPART 100 UNIT/ML ~~LOC~~ SOLN
0.0000 [IU] | Freq: Three times a day (TID) | SUBCUTANEOUS | Status: DC
  Administered 2016-08-29: 3 [IU] via SUBCUTANEOUS
  Administered 2016-08-29 (×2): 8 [IU] via SUBCUTANEOUS
  Administered 2016-08-29: 5 [IU] via SUBCUTANEOUS
  Administered 2016-08-30: 3 [IU] via SUBCUTANEOUS
  Administered 2016-08-30: 5 [IU] via SUBCUTANEOUS
  Administered 2016-08-31: 15 [IU] via SUBCUTANEOUS
  Administered 2016-09-01: 3 [IU] via SUBCUTANEOUS
  Administered 2016-09-01 – 2016-09-02 (×2): 8 [IU] via SUBCUTANEOUS
  Administered 2016-09-03: 5 [IU] via SUBCUTANEOUS
  Administered 2016-09-03: 2 [IU] via SUBCUTANEOUS
  Administered 2016-09-04: 3 [IU] via SUBCUTANEOUS
  Filled 2016-08-28 (×2): qty 3
  Filled 2016-08-28 (×3): qty 8
  Filled 2016-08-28 (×2): qty 3
  Filled 2016-08-28 (×2): qty 5

## 2016-08-28 MED ORDER — INSULIN GLARGINE 100 UNIT/ML ~~LOC~~ SOLN
25.0000 [IU] | Freq: Once | SUBCUTANEOUS | Status: AC
  Administered 2016-08-28: 25 [IU] via SUBCUTANEOUS
  Filled 2016-08-28: qty 0.25

## 2016-08-28 MED ORDER — INSULIN GLARGINE 100 UNIT/ML ~~LOC~~ SOLN
25.0000 [IU] | Freq: Every day | SUBCUTANEOUS | Status: DC
  Filled 2016-08-28: qty 0.25

## 2016-08-28 MED ORDER — CARVEDILOL 25 MG PO TABS
25.0000 mg | ORAL_TABLET | Freq: Two times a day (BID) | ORAL | Status: DC
  Administered 2016-08-29 – 2016-09-04 (×12): 25 mg via ORAL
  Filled 2016-08-28 (×14): qty 1

## 2016-08-28 MED ORDER — ATORVASTATIN CALCIUM 20 MG PO TABS
40.0000 mg | ORAL_TABLET | Freq: Every day | ORAL | Status: DC
  Administered 2016-08-29 – 2016-09-03 (×6): 40 mg via ORAL
  Filled 2016-08-28 (×6): qty 2

## 2016-08-28 MED ORDER — ALUM & MAG HYDROXIDE-SIMETH 200-200-20 MG/5ML PO SUSP: 30.0000 mL | ORAL | Status: DC | PRN

## 2016-08-28 MED ORDER — DOXERCALCIFEROL 4 MCG/2ML IV SOLN: 1.0000 ug | INTRAVENOUS | Status: DC

## 2016-08-28 MED ORDER — SEVELAMER CARBONATE 800 MG PO TABS
2400.0000 mg | ORAL_TABLET | Freq: Three times a day (TID) | ORAL | Status: DC
  Administered 2016-08-29 (×3): 2400 mg via ORAL
  Filled 2016-08-28 (×3): qty 3

## 2016-08-28 MED ORDER — ACETAMINOPHEN 325 MG PO TABS
650.0000 mg | ORAL_TABLET | Freq: Four times a day (QID) | ORAL | Status: DC | PRN
  Administered 2016-08-29: 650 mg via ORAL
  Filled 2016-08-28 (×2): qty 2

## 2016-08-28 NOTE — ED Notes (Signed)
Spoke with Calvin at [facility] intake regarding patient's orders and need for transfer. States Dr. Jerilee Hoh requests CBG prior to pt eating dinner and will review chart at 1800 to determine if she may be transferred without waiting for diabetes coordinator consult.

## 2016-08-28 NOTE — Progress Notes (Signed)
Pt accepted to [facility] behavioral unit. Accepting physician requested consult for management of CBG prior to transfer. CSW contacted [facility]- was informed sliding scale recommendations present in [facility]. CSW faxed MAR to [facility] for review.   Sharren Bridge, MSW, LCSW Clinical Social Work, Disposition  08/28/2016 [phone]

## 2016-08-28 NOTE — Progress Notes (Signed)
This Probation officer was informed by Kerry Dory at [facility] the was is ready for transfer today after 8:30pm and this was communicated to [place], [facility].      Chesley Noon, MSW, Loura Pardon [facility] Triage Specialist [phone]

## 2016-08-28 NOTE — ED Notes (Signed)
Pt up to use telephone

## 2016-08-28 NOTE — Consult Note (Signed)
Medical Consultation   [name]  MGN:[id number]  DOB: August 30, 1965  DOA: 08/25/2016  PCP: [name] Patient  Requesting physician: ED PA  Reason for consultation: Uncontrolled DM-hyperglycemia   History of Present Illness: [name] is an 51 y.o. female with PMH of ESRD on HD MWF, Brittle DM, Chronic systolic CHF, Depression, anxiety, bipolar d/o, chronic pain syndrome, schizoaffective d/o, polysubstance abuse was brought to the ED due to depression, suicidal ideation and missing HD treatments and feeling hopeless, seen by psych in ED, got  Hemodialysis on 1/21 and 1/23 per Renal and is awaiting Tx to Psych hospital at [facility]. TRH consulted for poorly controlled DM, CBGs in 500s to 75 now. Admits drinking soda in ER and eating whatever, poorly compliant with meds at home too. Today got 25units Novolog foll by 20units and then 8units , now CBG is 75. Feels depressed otherwise asymptomatic from this  Review of Systems:  ROS As per HPI otherwise 10 point review of systems negative.    Past Medical History: Past Medical History:  Diagnosis Date  . Anemia   . Anxiety   . Bipolar 1 disorder ([place])   . Bipolar disorder, unspecified 02/06/2014  . Chronic combined systolic and diastolic CHF (congestive heart failure) (HCC)    EF 40% by echo and 48% by stress test  . Chronic pain syndrome   . Depression   . ESRD on hemodialysis ([place])   . GERD (gastroesophageal reflux disease)   . Glaucoma   . Headache    sinus headaches  . Hepatitis C   . High cholesterol   . History of hiatal hernia   . History of vitamin D deficiency 06/20/2015  . Hypertension   . MRSA infection ?2014   "on the back of my head; spread throughout my bloodstream"  . Neuropathy ([place])   . Refusal of blood transfusions as patient is Jehovah's Witness   . Schizoaffective disorder, unspecified condition 02/08/2014  . Seizure [facility]) dx'd 2014   "don't know what kind; last one was ~ 04/2014"  . Stroke  [facility])    TIA 2009  . TIA (transient ischemic attack) 2010  . Type II diabetes mellitus ([place])    INSULIN DEPENDENT    Past Surgical History: Past Surgical History:  Procedure Laterality Date  . ABDOMINAL HYSTERECTOMY  2014  . ABDOMINAL HYSTERECTOMY  2010  . AMPUTATION Left 05/21/2013   Procedure: Left Midfoot AMPUTATION;  Surgeon: Newt Minion, MD;  Location: [place];  Service: Orthopedics;  Laterality: Left;  Left Midfoot Amputation  . AV FISTULA PLACEMENT Left 05/04/2015   Procedure: ARTERIOVENOUS (AV) GRAFT INSERTION;  Surgeon: Angelia Mould, MD;  Location: [place];  Service: Vascular;  Laterality: Left;  . COLONOSCOPY WITH PROPOFOL N/A 06/22/2013   Procedure: COLONOSCOPY WITH PROPOFOL;  Surgeon: Jeryl Columbia, MD;  Location: [facility] ENDOSCOPY;  Service: Endoscopy;  Laterality: N/A;  . ESOPHAGOGASTRODUODENOSCOPY N/A 05/11/2015   Procedure: ESOPHAGOGASTRODUODENOSCOPY (EGD);  Surgeon: Laurence Spates, MD;  Location: [facility] ENDOSCOPY;  Service: Endoscopy;  Laterality: N/A;  . EYE SURGERY Bilateral 2010   Lasik  . FOOT AMPUTATION Left    due diabetic neuropathy, could not feel ulcer on bottom of foot  . REFRACTIVE SURGERY Bilateral 2013  . TONSILLECTOMY  1970's  . TUBAL LIGATION    . TUBAL LIGATION  2000     Allergies:   Allergies  Allergen Reactions  .  Penicillins Hives and Other (See Comments)    Has patient had a PCN reaction causing immediate rash, facial/tongue/throat swelling, SOB or lightheadedness with hypotension: Yes Has patient had a PCN reaction causing severe rash involving mucus membranes or skin necrosis: No Has patient had a PCN reaction that required hospitalization No Has patient had a PCN reaction occurring within the last 10 years: No If all of the above answers are "NO", then may proceed with Cephalosporin use. Pt tolerates cefepime and ceftriaxone  . Gabapentin Itching and Swelling  . Lyrica [Pregabalin] Swelling and Other (See Comments)    Facial and leg  swelling  . Saphris [Asenapine] Swelling and Other (See Comments)    Facial swelling  . Tramadol Swelling and Other (See Comments)    Leg swelling  . Other Other (See Comments)    NO BLOOD PRODUCTS. PT IS A JEHOVAH WITNESS     Social History:  reports that she quit smoking about 16 months ago. Her smoking use included Cigarettes. She has a 20.00 pack-year smoking history. She has never used smokeless tobacco. She reports that she drinks about 1.8 oz of alcohol per week . She reports that she uses drugs, including "Crack" cocaine and Cocaine.   Family History: Family History  Problem Relation Age of Onset  . Hypertension Mother   . Hypertension Father     Physical Exam: Vitals:   08/27/16 1836 08/28/16 0107 08/28/16 0625 08/28/16 1211  BP: 145/78 156/72 152/65 161/70  Pulse: 81 80 78 73  Resp: 18 16 16 18   Temp: 98.4 F (36.9 C) 98.4 F (36.9 C) 98.2 F (36.8 C) 97.5 F (36.4 C)  TempSrc: Oral Oral Oral Oral  SpO2: 99% 98% 100% 96%  Weight:      Height:        Constitutional: chronically ill, malnourished female, no distress, AAOx3 Eyes: PERLA, EOMI, irises appear normal, anicteric sclera,  Neck: neck appears normal, no masses, normal ROM, no thyromegaly, no JVD  CVS: S1-S2 clear, no murmur rubs or gallops, no LE edema, normal pedal pulses  Respiratory:  clear to auscultation bilaterally,, Respiratory effort normal. No accessory muscle use.  Abdomen: soft nontender, nondistended, normal bowel sounds, no hepatosplenomegaly, no hernias  Musculoskeletal: : no cyanosis, clubbing or edema noted bilaterally                    R elbow deformed Neuro: Cranial nerves II-XII intact, strength, sensation, reflexes Psych: judgement and insight appear normal, stable mood and affect, mental status Skin: diffuse hyperpigmened scars  Data reviewed:  I have personally reviewed following labs and imaging studies Labs:  CBC:  Recent Labs Lab 08/26/16 0005 08/27/16 1320  WBC 2.8*  2.7*  NEUTROABS 1.9  --   HGB 7.8* 6.9*  HCT 24.9* 22.2*  MCV 94.0 94.9  PLT 118* 120*    Basic Metabolic Panel:  Recent Labs Lab 08/26/16 0005 08/27/16 2009  NA 130* 135  K 5.1 3.9  CL 90* 94*  CO2 25 27  GLUCOSE 583* 293*  BUN 47* 17  CREATININE 7.16* 3.14*  CALCIUM 7.9* 8.3*  PHOS  --  3.0   GFR Estimated Creatinine Clearance: 20.9 mL/min (by C-G formula based on SCr of 3.14 mg/dL (H)). Liver Function Tests:  Recent Labs Lab 08/26/16 0005 08/27/16 2009  AST 26  --   ALT 24  --   ALKPHOS 146*  --   BILITOT 0.8  --   PROT 6.7  --   ALBUMIN  3.0* 3.1*    Recent Labs Lab 08/26/16 0005  LIPASE 66*   No results for input(s): AMMONIA in the last 168 hours. Coagulation profile No results for input(s): INR, PROTIME in the last 168 hours.  Cardiac Enzymes: No results for input(s): CKTOTAL, CKMB, CKMBINDEX, TROPONINI in the last 168 hours. BNP: Invalid input(s): POCBNP CBG:  Recent Labs Lab 08/27/16 1639 08/28/16 0628 08/28/16 0728 08/28/16 0925 08/28/16 1323  GLUCAP 113* 589* 596* 286* 75   D-Dimer No results for input(s): DDIMER in the last 72 hours. Hgb A1c No results for input(s): HGBA1C in the last 72 hours. Lipid Profile No results for input(s): CHOL, HDL, LDLCALC, TRIG, CHOLHDL, LDLDIRECT in the last 72 hours. Thyroid function studies No results for input(s): TSH, T4TOTAL, T3FREE, THYROIDAB in the last 72 hours.  Invalid input(s): FREET3 Anemia work up No results for input(s): VITAMINB12, FOLATE, FERRITIN, TIBC, IRON, RETICCTPCT in the last 72 hours. Urinalysis    Component Value Date/Time   COLORURINE YELLOW 08/26/2016 1045   APPEARANCEUR CLOUDY (A) 08/26/2016 1045   LABSPEC 1.016 08/26/2016 1045   PHURINE 6.0 08/26/2016 1045   GLUCOSEU >=500 (A) 08/26/2016 1045   HGBUR NEGATIVE 08/26/2016 [place] 08/26/2016 1045   BILIRUBINUR neg 12/13/2014 1127   KETONESUR NEGATIVE 08/26/2016 1045   PROTEINUR 100 (A)  08/26/2016 1045   UROBILINOGEN 0.2 05/01/2015 1252   NITRITE NEGATIVE 08/26/2016 [place] 08/26/2016 1045     Microbiology No results found for this or any previous visit (from the past 240 hour(s)).     Inpatient Medications:   Scheduled Meds: . amLODipine  10 mg Oral QHS  . atorvastatin  40 mg Oral QHS  . calcium acetate  1,334 mg Oral TID WC  . carvedilol  25 mg Oral BID WC  . cloNIDine  0.1 mg Oral Q8H  . [START ON 08/29/2016] darbepoetin (ARANESP) injection - DIALYSIS  200 mcg Intravenous Q Thu-HD  . doxercalciferol  1 mcg Intravenous Q M,W,F-HD  . feeding supplement (PRO-STAT SUGAR FREE 64)  30 mL Oral BID  . folic acid  1 mg Oral BID WC  . furosemide  120 mg Oral Daily  . insulin aspart  0-15 Units Subcutaneous TID WC  . insulin glargine  25 Units Subcutaneous QHS  . multivitamin  1 tablet Oral QHS  . nicotine  14 mg Transdermal Once  . polyethylene glycol  17 g Oral Daily  . senna-docusate  1 tablet Oral BID  . sevelamer carbonate  2,400 mg Oral TID WC   Continuous Infusions: . sodium chloride    . sodium chloride       Radiological Exams on Admission: Dg Chest 2 View  Result Date: 08/27/2016 CLINICAL DATA:  Shortness of breath. EXAM: CHEST  2 VIEW COMPARISON:  Radiographs of August 26, 2016. FINDINGS: Stable cardiomegaly. No pneumothorax is noted. Stable mild central pulmonary vascular congestion. Right lung is clear. Stable left basilar opacity is noted concerning for atelectasis or infiltrate with associated pleural effusion. Bony thorax is unremarkable. IMPRESSION: Stable left basilar atelectasis or infiltrate with associated pleural effusion. Electronically Signed   By: Marijo Conception, M.D.   On: 08/27/2016 11:42    Impression/Recommendations    Uncontrolled DM -Brittle Diabetic with wide fluctuations in CBGs today, supposed to be on lantus 25units and SSI TID AC at home -last Hba1c last month was 8.7 -Today's CBGs 589-> 596-> 286->  75 now -already got 25units then 20units of Novolog this  am, followed by 8units, will keep on this regimen today for fear of stacking of Insulin and hypoglycemia tonight, and not increase dose. Will not add meal coverage yet since PO intake unreliable -Continue lantus, pt was drinking sodas and eating crackers etc which can contribute to this -changed to Renal carb mod diet, advised pt abt sodas  ESRd on HD -HD per Renal, awaiting transfer to [facility] for Psych/med care    Adjustment disorder/  Schizoaffective disorder, bipolar type (HCC)/  Polysubstance abuse -per Psych at [facility]  Chronic pain syndrome -on Percocet TID PRN at home  Thank you for this consultation.  Our [facility] hospitalist team will follow the patient with you.   Time Spent: 97min  Caymen Dubray M.D. [facility] 08/28/2016, 4:23 PM

## 2016-08-28 NOTE — ED Notes (Signed)
Contacted Dr. Roxanne Mins regarding pt's elevated CBG reading. MD to place order.

## 2016-08-28 NOTE — ED Notes (Signed)
Appropriate food for renal/carb modified diet meal tray arrived for pt who refused to eat most of the tray, except the bread, cheese, and meat off the salad.  This RN, food service response, the sitter and the pt came to an agreement on a new food tray to be served, which was placed on a rush order.  Pt is unhappy about not being allowed the food she wants in the quantity she would like it in.  She also states she feels bad when her CBG is WDL, "that is too low for me."

## 2016-08-28 NOTE — BH Assessment (Addendum)
Patient has been accepted to [facility].  Accepting physician is Dr. Jerilee Hoh.  Attending Physician will be Dr. Jerilee Hoh.  Patient has been assigned to room 310, by [place] F.  Call report to [phone].  Representative/Transfer Coordinator is Dispensing optician Patient pre-admitted by [facility] Patient Access (Shamika P.)  [place] Staff Starleen Blue, RN) made aware of acceptance. [facility] ER Staff Claudette Head, TTS/Soical Worker) made aware of acceptance.  Patient can arrive anytime after 8:30pm.  Assigned room is being clean.

## 2016-08-28 NOTE — ED Notes (Signed)
Meal tray ordered 

## 2016-08-28 NOTE — ED Notes (Signed)
Contacted [facility] to update on status, spoke with Kerry Dory to update on order for Diabetes Coordinator consult.

## 2016-08-28 NOTE — ED Notes (Signed)
Attempt made to call report to [facility]; RN to call back

## 2016-08-28 NOTE — ED Notes (Signed)
Pt's sitter informed this RN that pt asked her to warm a meal tray from last night, including mashed potatoes and collards.  Pt additionally had extra crackers in her room from previous snacks that she began to eat.  This Retail banker both spent time speaking with pt about blood sugar control and appropriate food intake.

## 2016-08-28 NOTE — ED Provider Notes (Signed)
CBG it has been markedly elevated to over 500. She received a dose of insulin, but repeat glucose had not come down. She is given an additional dose of NovoLog.   [name], MD [id number]

## 2016-08-28 NOTE — Care Management (Signed)
Patient will be transfer to [facility] today as per CSW. No ED CM needs identified.

## 2016-08-28 NOTE — ED Notes (Signed)
A Renal Carb Modified Diet was ordered for Lunch, and Patient was given to cups of ice with Diet Ginger Ale.

## 2016-08-28 NOTE — ED Provider Notes (Signed)
  Physical Exam  BP 156/66 (BP Location: Right Arm)   Pulse 72   Temp 97.5 F (36.4 C) (Axillary)   Resp 18   Ht 5\' 4"  (1.626 m)   Wt 159 lb 13.3 oz (72.5 kg)   LMP  (LMP Unknown)   SpO2 100%   BMI 27.44 kg/m   Physical Exam  ED Course  Procedures  MDM Patient is accepted to [facility] under Dr. Jerilee Hoh. Has been getting dialysis while being here. Glucose better under controlled with insulin regimen.       Drenda Freeze, MD 08/28/16 2040

## 2016-08-28 NOTE — BH Assessment (Signed)
Following morning "Advertising copywriter spoke with, attending physician (Dr. Henderson [place]), [facility] BMU Charge RN Meredith Mody F.) and Surveyor, quantity Marliss Coots B.) about patient. At that time, patient's last CBG was 596. Patient is still to be admitted but CBG will need to be stable.  Writer spoke with [facility] Disposition Social Worker ([phone]) in regards to patient being admitted to [facility] BMU. Informed her bed is still available, but due to admission criteria she will not be able to transfer until Blood Sugars are stable. Received "Voluntary Admission and Consent for Treatment" via fax."  Writer received phone call from [place] Social Worker ([phone]) stating the patient's Glucuose sugars were 75. [place] wanted to know if patient can now transfer to [facility] or if she needed to wait the 24 hours, as indicated in admission criteria.  Writer spoke with [facility] Attending Physician (Dr. Jerilee Hoh) and she voiced concern about fluctuation in patient's numbers. Requested Hospitalists Consult. Received phone call from Social Worker [employer]) stating, EDP wasn't willing to do so, due to having orders already in place insulin. Spoke with Attending Physician (Dr. Jerilee Hoh) and she reported concerns about patient levels fluctuation with current orders, their were no adjustments made.  While in Attending Physician office, writer contacted [facility] Disposition Social Worker, via speaker phone. Attending physician explain why she wanted the consult. Social worker stated she will contact [place] again.  Attending Physician (Dr. Jerilee Hoh) and writer followed up with patient's RN Raquel Sarna B.) via speaker phone in regards to the consult. [facility] Disposition Social Worker Landscape architect) was gone for the day and their was no coverage at the time. She stated the consult was order but wasn't completed at the time.   Writer received phone call from patient's nurse [facility]) stating the recommendations from the consult.  Writer spoke with Attending Physician and updated her on what was said. Was advised to follow up with her after patient's next CBG reading (before dinner).  Writer contacted patient's RN and informed her of the conversation with Attending Physician, the bed was still available but needed to to know the results of her next CBG, it will determine when it's safe to transfer patient to [facility].  Writer received phone call from Attending Physician, asking about CBG results, they were not in the chart at the time. Writer made attempts to contact RN but was unsuccessful.  CBG results posted at 18:01 and Probation officer spoke with Attending Physician about results. She states the patients results were appropriate to move forward with admission.  Writer contacted patient's RN and informed her of the discussion and gave her bed assignment. Spoke with [facility] to update them as well.

## 2016-08-29 ENCOUNTER — Encounter: Payer: Self-pay | Admitting: Psychiatry

## 2016-08-29 DIAGNOSIS — B1920 Unspecified viral hepatitis C without hepatic coma: Secondary | ICD-10-CM

## 2016-08-29 DIAGNOSIS — F332 Major depressive disorder, recurrent severe without psychotic features: Secondary | ICD-10-CM

## 2016-08-29 DIAGNOSIS — L97209 Non-pressure chronic ulcer of unspecified calf with unspecified severity: Secondary | ICD-10-CM

## 2016-08-29 DIAGNOSIS — I639 Cerebral infarction, unspecified: Secondary | ICD-10-CM

## 2016-08-29 DIAGNOSIS — E11621 Type 2 diabetes mellitus with foot ulcer: Secondary | ICD-10-CM

## 2016-08-29 DIAGNOSIS — F1420 Cocaine dependence, uncomplicated: Secondary | ICD-10-CM

## 2016-08-29 DIAGNOSIS — E11622 Type 2 diabetes mellitus with other skin ulcer: Secondary | ICD-10-CM

## 2016-08-29 DIAGNOSIS — L97509 Non-pressure chronic ulcer of other part of unspecified foot with unspecified severity: Secondary | ICD-10-CM

## 2016-08-29 LAB — CBC
HEMATOCRIT: 22.9 % — AB (ref 35.0–47.0)
HEMOGLOBIN: 7.4 g/dL — AB (ref 12.0–16.0)
MCH: 30.1 pg (ref 26.0–34.0)
MCHC: 32.4 g/dL (ref 32.0–36.0)
MCV: 92.9 fL (ref 80.0–100.0)
Platelets: 130 10*3/uL — ABNORMAL LOW (ref 150–440)
RBC: 2.47 MIL/uL — ABNORMAL LOW (ref 3.80–5.20)
RDW: 18.5 % — AB (ref 11.5–14.5)
WBC: 2.9 10*3/uL — AB (ref 3.6–11.0)

## 2016-08-29 LAB — RENAL FUNCTION PANEL
ALBUMIN: 2.8 g/dL — AB (ref 3.5–5.0)
ANION GAP: 11 (ref 5–15)
BUN: 64 mg/dL — AB (ref 6–20)
CHLORIDE: 93 mmol/L — AB (ref 101–111)
CO2: 27 mmol/L (ref 22–32)
Calcium: 8.1 mg/dL — ABNORMAL LOW (ref 8.9–10.3)
Creatinine, Ser: 5.62 mg/dL — ABNORMAL HIGH (ref 0.44–1.00)
GFR calc Af Amer: 9 mL/min — ABNORMAL LOW (ref >60)
GFR, EST NON AFRICAN AMERICAN: 8 mL/min — AB (ref >60)
Glucose, Bld: 256 mg/dL — ABNORMAL HIGH (ref 65–99)
PHOSPHORUS: 3.8 mg/dL (ref 2.5–4.6)
POTASSIUM: 4 mmol/L (ref 3.5–5.1)
Sodium: 131 mmol/L — ABNORMAL LOW (ref 135–145)

## 2016-08-29 LAB — GLUCOSE, CAPILLARY
GLUCOSE-CAPILLARY: 131 mg/dL — AB (ref 65–99)
Glucose-Capillary: 272 mg/dL — ABNORMAL HIGH (ref 65–99)
Glucose-Capillary: 232 mg/dL — ABNORMAL HIGH (ref 65–99)
Glucose-Capillary: 156 mg/dL — ABNORMAL HIGH (ref 65–99)

## 2016-08-29 LAB — MRSA PCR SCREENING: MRSA by PCR: NEGATIVE

## 2016-08-29 MED ORDER — FUROSEMIDE 40 MG PO TABS
80.0000 mg | ORAL_TABLET | Freq: Two times a day (BID) | ORAL | Status: DC
  Administered 2016-08-29 – 2016-09-04 (×11): 80 mg via ORAL
  Filled 2016-08-29 (×14): qty 2

## 2016-08-29 MED ORDER — INSULIN GLARGINE 100 UNIT/ML ~~LOC~~ SOLN
28.0000 [IU] | Freq: Every day | SUBCUTANEOUS | Status: DC
  Administered 2016-08-29: 28 [IU] via SUBCUTANEOUS
  Filled 2016-08-29 (×2): qty 0.28

## 2016-08-29 MED ORDER — RENA-VITE PO TABS
1.0000 | ORAL_TABLET | Freq: Every day | ORAL | Status: DC
  Administered 2016-08-29 – 2016-09-03 (×6): 1 via ORAL
  Filled 2016-08-29 (×6): qty 1

## 2016-08-29 MED ORDER — INSULIN ASPART 100 UNIT/ML ~~LOC~~ SOLN
5.0000 [IU] | Freq: Three times a day (TID) | SUBCUTANEOUS | Status: DC
  Administered 2016-08-29 – 2016-09-04 (×14): 5 [IU] via SUBCUTANEOUS
  Filled 2016-08-29 (×11): qty 5

## 2016-08-29 MED ORDER — ACETAMINOPHEN 500 MG PO TABS: 1000.0000 mg | ORAL_TABLET | Freq: Four times a day (QID) | ORAL | Status: DC | PRN

## 2016-08-29 MED ORDER — TRAMADOL HCL 50 MG PO TABS: 50.0000 mg | ORAL_TABLET | Freq: Four times a day (QID) | ORAL | Status: DC | PRN

## 2016-08-29 MED ORDER — OXYCODONE HCL 5 MG PO TABS
5.0000 mg | ORAL_TABLET | Freq: Four times a day (QID) | ORAL | Status: DC | PRN
  Administered 2016-08-29 – 2016-09-04 (×9): 5 mg via ORAL
  Filled 2016-08-29 (×9): qty 1

## 2016-08-29 MED ORDER — DOXERCALCIFEROL 4 MCG/2ML IV SOLN
1.0000 ug | INTRAVENOUS | Status: DC
  Administered 2016-08-29 – 2016-09-03 (×3): 1 ug via INTRAVENOUS
  Filled 2016-08-29 (×3): qty 2

## 2016-08-29 MED ORDER — TRAZODONE HCL 50 MG PO TABS
25.0000 mg | ORAL_TABLET | Freq: Every day | ORAL | Status: DC
  Administered 2016-08-29 – 2016-09-03 (×6): 25 mg via ORAL
  Filled 2016-08-29 (×3): qty 1
  Filled 2016-08-29: qty 2
  Filled 2016-08-29 (×2): qty 1

## 2016-08-29 MED ORDER — TRAZODONE HCL 100 MG PO TABS: 100.0000 mg | ORAL_TABLET | Freq: Every day | ORAL | Status: DC

## 2016-08-29 MED ORDER — DIPHENHYDRAMINE HCL 25 MG PO CAPS
25.0000 mg | ORAL_CAPSULE | Freq: Once | ORAL | Status: AC
  Administered 2016-08-29: 25 mg via ORAL

## 2016-08-29 MED ORDER — SERTRALINE HCL 50 MG PO TABS
50.0000 mg | ORAL_TABLET | Freq: Every day | ORAL | Status: DC
  Administered 2016-08-29 – 2016-09-03 (×6): 50 mg via ORAL
  Filled 2016-08-29 (×6): qty 1
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
## 2016-08-29 NOTE — BHH Group Notes (Signed)
[place] Group Notes:  (Nursing/MHT/Case Management/Adjunct)  Date:  08/29/2016  Time:  6:03 PM  Type of Therapy:  Psychoeducational Skills  Participation Level:  Did Not Attend  Charise Killian 08/29/2016, 6:03 PM
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
## 2016-08-29 NOTE — BHH Group Notes (Signed)
[place] LCSW Group Therapy Note  Date/Time 08/29/16 1300  Type of Therapy/Topic:  Group Therapy:  Balance in Life  Participation Level:  Pt did not attend group  Description of Group:    This group will address the concept of balance and how it feels and looks when one is unbalanced. Patients will be encouraged to process areas in their lives that are out of balance, and identify reasons for remaining unbalanced. Facilitators will guide patients utilizing problem- solving interventions to address and correct the stressor making their life unbalanced. Understanding and applying boundaries will be explored and addressed for obtaining  and maintaining a balanced life. Patients will be encouraged to explore ways to assertively make their unbalanced needs known to significant others in their lives, using other group members and facilitator for support and feedback.  Therapeutic Goals: 1. Patient will identify two or more emotions or situations they have that consume much of in their lives. 2. Patient will identify signs/triggers that life has become out of balance:  3. Patient will identify two ways to set boundaries in order to achieve balance in their lives:  4. Patient will demonstrate ability to communicate their needs through discussion and/or role plays  Summary of Patient Progress:     Therapeutic Modalities:   Cognitive Behavioral Therapy Solution-Focused Therapy Assertiveness Training  Lurline Idol, LCSW

## 2016-08-29 NOTE — Progress Notes (Signed)
Nephrologist on call paged regarding consult. Dr. Candiss Norse responded and verbalizes understanding of consult.

## 2016-08-29 NOTE — Progress Notes (Signed)
Pt noted to have wounds in multiple areas. Multiple raised, closed sores all over her back and neck.One wound to right lower leg/calf area, no drainage noted. One wound to tip of right great toe, no drainage noted. Right great toe appears to be swollen. Left foot half amputation noted. Pt is able to ambulate without assistance, steady gait. Pt also has L arm shunt for dialysis.

## 2016-08-29 NOTE — Consult Note (Signed)
Date: 08/29/2016                  Patient Name:  [name]  MRN: [id number]  DOB: 05/15/1966  Age / Sex: 50 y.o., female         PCP: [name] Patient                 Service Requesting Consult: Behavioral health                 Reason for Consult: ESRD            History of Present Illness: Patient is a 51 y.o. female with medical problems of end-stage renal disease, on hemodialysis, who was admitted to [facility] on 08/28/2016 for evaluation of schizoaffective disorder. . Patient has been on dialysis for 8 months. She dialyzes in [place], Tuesday, Thursday, Saturday for 3-1/2 hours She states her estimated dry weight is 65 kg  Patient seen during dialysis Tolerating well    HEMODIALYSIS FLOWSHEET:  Blood Flow Rate (mL/min): 400 mL/min Arterial Pressure (mmHg): -110 mmHg Venous Pressure (mmHg): 210 mmHg Transmembrane Pressure (mmHg): 80 mmHg Ultrafiltration Rate (mL/min): 1310 mL/min Dialysate Flow Rate (mL/min): 800 ml/min Conductivity: Machine : 13.9 Conductivity: Machine : 13.9 Dialysis Fluid Bolus: Normal Saline Bolus Amount (mL): 250 mL (prime) Dialysate Change: 2K Intra-Hemodialysis Comments: post HD vitals stable    Medications: Outpatient medications: Prescriptions Prior to Admission  Medication Sig Dispense Refill Last Dose  . acetaminophen (TYLENOL) 325 MG tablet Take 2 tablets (650 mg total) by mouth every 6 (six) hours as needed for mild pain (or Fever >/= 101).   unk  . Amino Acids-Protein Hydrolys (FEEDING SUPPLEMENT, PRO-STAT SUGAR FREE 64,) LIQD Take 30 mLs by mouth 2 (two) times daily. 900 mL 0 Past Week at Unknown time  . amLODipine (NORVASC) 10 MG tablet Take 1 tablet (10 mg total) by mouth at bedtime. 30 tablet 0 Past Week at Unknown time  . atorvastatin (LIPITOR) 40 MG tablet Take 1 tablet (40 mg total) by mouth at bedtime. 30 tablet 0 Past Week at Unknown time  . B Complex-C-Folic Acid (RENAL VITAMIN PO) Take 1 tablet by mouth at bedtime.    Past Week at Unknown time  . calcium acetate (PHOSLO) 667 MG capsule Take 2 capsules (1,334 mg total) by mouth 3 (three) times daily with meals. 120 capsule 0 Past Week at Unknown time  . carvedilol (COREG) 25 MG tablet Take 25 mg by mouth 2 (two) times daily with a meal.  3 08/25/2016 at 1600  . cloNIDine (CATAPRES) 0.1 MG tablet Take 1 tablet (0.1 mg total) by mouth every 8 (eight) hours. 90 tablet 0 08/25/2016 at Unknown time  . diphenhydrAMINE (BENADRYL) 25 mg capsule Take 1 capsule (25 mg total) by mouth every 6 (six) hours as needed for itching. 30 capsule 0 unk  . folic acid (FOLVITE) 1 MG tablet Take 1 tablet (1 mg total) by mouth 2 (two) times daily with a meal. 60 tablet 0 Past Week at Unknown time  . furosemide (LASIX) 40 MG tablet Take 120 mg by mouth daily.   08/25/2016 at Unknown time  . HYDROcodone-acetaminophen (NORCO/VICODIN) 5-325 MG tablet Take 1-2 tablets by mouth every 6 (six) hours as needed. (Patient not taking: Reported on 08/27/2016) 10 tablet 0 Not Taking at Unknown time  . insulin aspart (NOVOLOG) 100 UNIT/ML injection Inject 0-12 Units into the skin 3 (three) times daily before meals. Per sliding scale inject SQ <  150=0units, 151-200=2 units, 201-250=4units, 251-300=6units,301-350=8 units, 351-400=10 units, >/= 401=12 units (Patient taking differently: Inject 2-12 Units into the skin 3 (three) times daily before meals. Inject 2 units subcutaneously daily before meals plus sliding scale adjustment based on CBG) 10 mL 0 Past Week at Unknown time  . insulin glargine (LANTUS) 100 UNIT/ML injection Inject 0.25 mLs (25 Units total) into the skin at bedtime.   Past Week at Unknown time  . lidocaine-prilocaine (EMLA) cream Apply 1 application topically See admin instructions. Apply to dialysis site one hour prior to dialysis   Past Week at Unknown time  . LORazepam (ATIVAN) 0.5 MG tablet Take 0.5 mg by mouth See admin instructions. Take 1 tablet (0.5 mg) by mouth prior to dialysis and at  bedtime as needed for sleep   Past Week at Unknown time  . polyethylene glycol (MIRALAX / GLYCOLAX) packet Take 17 g by mouth daily. 14 each 0 Past Week at Unknown time  . polyvinyl alcohol (LIQUIFILM TEARS) 1.4 % ophthalmic solution Place 2 drops into both eyes every 6 (six) hours as needed for dry eyes.    unk  . senna-docusate (SENOKOT-S) 8.6-50 MG tablet Take 1 tablet by mouth 2 (two) times daily. 30 tablet 0 Past Week at Unknown time  . sevelamer carbonate (RENVELA) 800 MG tablet Take 3 tablets (2,400 mg total) by mouth 3 (three) times daily with meals. 270 tablet 0 Past Week at Unknown time    Current medications: Current Facility-Administered Medications  Medication Dose Route Frequency Provider Last Rate Last Dose  . acetaminophen (TYLENOL) tablet 1,000 mg  1,000 mg Oral Q6H PRN Hildred Priest, MD      . alum & mag hydroxide-simeth (MAALOX/MYLANTA) 200-200-20 MG/5ML suspension 30 mL  30 mL Oral Q4H PRN Hildred Priest, MD      . amLODipine (NORVASC) tablet 10 mg  10 mg Oral QHS Hildred Priest, MD      . atorvastatin (LIPITOR) tablet 40 mg  40 mg Oral QHS Hildred Priest, MD      . calcium acetate (PHOSLO) capsule 1,334 mg  1,334 mg Oral TID WC Hildred Priest, MD   1,334 mg at 08/29/16 1207  . carvedilol (COREG) tablet 25 mg  25 mg Oral BID WC Hildred Priest, MD   25 mg at 08/29/16 1026  . cloNIDine (CATAPRES) tablet 0.1 mg  0.1 mg Oral Q8H Hildred Priest, MD   0.1 mg at 08/29/16 1026  . doxercalciferol (HECTOROL) injection 1 mcg  1 mcg Intravenous Q T,Th,Sa-HD Hildred Priest, MD   1 mcg at 08/29/16 1432  . feeding supplement (PRO-STAT SUGAR FREE 64) liquid 30 mL  30 mL Oral BID Hildred Priest, MD   30 mL at 08/29/16 1146  . furosemide (LASIX) tablet 120 mg  120 mg Oral Daily Hildred Priest, MD   120 mg at 08/29/16 1026  . insulin aspart (novoLOG) injection 0-15 Units  0-15 Units  Subcutaneous TID WC Hildred Priest, MD   5 Units at 08/29/16 1207  . insulin aspart (novoLOG) injection 0-5 Units  0-5 Units Subcutaneous QHS Hildred Priest, MD   3 Units at 08/28/16 2317  . insulin aspart (novoLOG) injection 5 Units  5 Units Subcutaneous TID WC Hildred Priest, MD   5 Units at 08/29/16 0500  . insulin glargine (LANTUS) injection 28 Units  28 Units Subcutaneous QHS Hildred Priest, MD      . multivitamin (RENA-VIT) tablet 1 tablet  1 tablet Oral QHS Hildred Priest, MD      .  ondansetron (ZOFRAN) tablet 4 mg  4 mg Oral Q8H PRN Hildred Priest, MD      . oxyCODONE (Oxy IR/ROXICODONE) immediate release tablet 5 mg  5 mg Oral Q6H PRN Hildred Priest, MD   5 mg at 08/29/16 1207  . sertraline (ZOLOFT) tablet 50 mg  50 mg Oral Daily Hildred Priest, MD      . sevelamer carbonate (RENVELA) tablet 2,400 mg  2,400 mg Oral TID WC Hildred Priest, MD   2,400 mg at 08/29/16 1208  . traZODone (DESYREL) tablet 25 mg  25 mg Oral QHS Hildred Priest, MD          Allergies: Allergies  Allergen Reactions  . Penicillins Hives and Other (See Comments)    Has patient had a PCN reaction causing immediate rash, facial/tongue/throat swelling, SOB or lightheadedness with hypotension: Yes Has patient had a PCN reaction causing severe rash involving mucus membranes or skin necrosis: No Has patient had a PCN reaction that required hospitalization No Has patient had a PCN reaction occurring within the last 10 years: No If all of the above answers are "NO", then may proceed with Cephalosporin use. Pt tolerates cefepime and ceftriaxone  . Gabapentin Itching and Swelling  . Lyrica [Pregabalin] Swelling and Other (See Comments)    Facial and leg swelling  . Saphris [Asenapine] Swelling and Other (See Comments)    Facial swelling  . Tramadol Swelling and Other (See Comments)    Leg swelling  . Other  Other (See Comments)    NO BLOOD PRODUCTS. PT IS A JEHOVAH WITNESS      Past Medical History: Past Medical History:  Diagnosis Date  . Anemia   . Anxiety   . Bipolar 1 disorder ([place])   . Bipolar disorder, unspecified 02/06/2014  . Chronic combined systolic and diastolic CHF (congestive heart failure) (HCC)    EF 40% by echo and 48% by stress test  . Chronic pain syndrome   . Depression   . ESRD on hemodialysis ([place])   . GERD (gastroesophageal reflux disease)   . Glaucoma   . Headache    sinus headaches  . Hepatitis C   . High cholesterol   . History of hiatal hernia   . History of vitamin D deficiency 06/20/2015  . Hypertension   . MRSA infection ?2014   "on the back of my head; spread throughout my bloodstream"  . Neuropathy ([place])   . Refusal of blood transfusions as patient is Jehovah's Witness   . Schizoaffective disorder, unspecified condition 02/08/2014  . Seizure [facility]) dx'd 2014   "don't know what kind; last one was ~ 04/2014"  . Stroke [facility])    TIA 2009  . TIA (transient ischemic attack) 2010  . Type II diabetes mellitus ([place])    INSULIN DEPENDENT     Past Surgical History: Past Surgical History:  Procedure Laterality Date  . ABDOMINAL HYSTERECTOMY  2014  . ABDOMINAL HYSTERECTOMY  2010  . AMPUTATION Left 05/21/2013   Procedure: Left Midfoot AMPUTATION;  Surgeon: Newt Minion, MD;  Location: [place];  Service: Orthopedics;  Laterality: Left;  Left Midfoot Amputation  . AV FISTULA PLACEMENT Left 05/04/2015   Procedure: ARTERIOVENOUS (AV) GRAFT INSERTION;  Surgeon: Angelia Mould, MD;  Location: [place];  Service: Vascular;  Laterality: Left;  . COLONOSCOPY WITH PROPOFOL N/A 06/22/2013   Procedure: COLONOSCOPY WITH PROPOFOL;  Surgeon: Jeryl Columbia, MD;  Location: [facility] ENDOSCOPY;  Service: Endoscopy;  Laterality: N/A;  . ESOPHAGOGASTRODUODENOSCOPY N/A 05/11/2015  Procedure: ESOPHAGOGASTRODUODENOSCOPY (EGD);  Surgeon: Laurence Spates, MD;  Location: [facility] ENDOSCOPY;   Service: Endoscopy;  Laterality: N/A;  . EYE SURGERY Bilateral 2010   Lasik  . FOOT AMPUTATION Left    due diabetic neuropathy, could not feel ulcer on bottom of foot  . REFRACTIVE SURGERY Bilateral 2013  . TONSILLECTOMY  1970's  . TUBAL LIGATION    . TUBAL LIGATION  2000     Family History: Family History  Problem Relation Age of Onset  . Hypertension Mother   . Hypertension Father      Social History: Social History   Social History  . Marital status: Single    Spouse name: N/A  . Number of children: N/A  . Years of education: N/A   Occupational History  . Not on file.   Social History Main Topics  . Smoking status: Former Smoker    Packs/day: 1.00    Years: 20.00    Types: Cigarettes    Quit date: 03/31/2015  . Smokeless tobacco: Never Used     Comment: "stopped smoking in ~ 2013; restarted last week; stopped again 06/17/2014"  . Alcohol use 1.8 oz/week    3 Standard drinks or equivalent per week     Comment: 05/01/15 patient denies alcohol use.  . Drug use: Yes    Types: "Crack" cocaine, Cocaine     Comment: last smoked crack 08/21/16  . Sexual activity: Not Currently    Birth control/ protection: Abstinence   Other Topics Concern  . Not on file   Social History Narrative   ** Merged History Encounter **         Review of Systems: Gen: no fevers or chills HEENT: no vision or hearing problems CV: some dyspnea on exertion, lower extremity swelling Resp: no cough or sputum FV:CBSWHQPR is good GU : no problems with voiding MS: no acute joint pains or swelling Derm:  no complaints Psych:no complaints Heme: no complaints Neuro: no complaints Endocrine.  No complaints  Vital Signs: Blood pressure (!) 170/75, pulse 78, temperature 97.7 F (36.5 C), temperature source Oral, resp. rate (!) 25, height 5\' 4"  (1.626 m), weight 77.2 kg (170 lb 3.1 oz), SpO2 99 %.   Intake/Output Summary (Last 24 hours) at 08/29/16 1636 Last data filed at 08/29/16  1242  Gross per 24 hour  Intake              480 ml  Output                0 ml  Net              480 ml    Weight trends: [employer]   08/28/16 2242 08/29/16 1309  Weight: 75.3 kg (166 lb) 77.2 kg (170 lb 3.1 oz)    Physical Exam: General:  no acute distress, sitting up in dialysis chair  HEENT Anicteric, moist oral mucous membranes  Neck:  supple  Lungs: Normal effort, clear to auscultation  Heart::  regular, soft systolic murmur  Abdomen: Soft, nontender, nondistended  Extremities:  2+ pitting edema  Neurologic: Alert, oriented  Skin: No acute rashes  Access: AVG          Lab results: Basic Metabolic Panel:  Recent Labs Lab 08/26/16 0005 08/27/16 2009 08/29/16 1320  NA 130* 135 131*  K 5.1 3.9 4.0  CL 90* 94* 93*  CO2 25 27 27   GLUCOSE 583* 293* 256*  BUN 47* 17 64*  CREATININE 7.16* 3.14* 5.62*  CALCIUM 7.9* 8.3* 8.1*  PHOS  --  3.0 3.8    Liver Function Tests:  Recent Labs Lab 08/26/16 0005  08/29/16 1320  AST 26  --   --   ALT 24  --   --   ALKPHOS 146*  --   --   BILITOT 0.8  --   --   PROT 6.7  --   --   ALBUMIN 3.0*  < > 2.8*  < > = values in this interval not displayed.  Recent Labs Lab 08/26/16 0005  LIPASE 66*   No results for input(s): AMMONIA in the last 168 hours.  CBC:  Recent Labs Lab 08/26/16 0005 08/27/16 1320 08/29/16 1320  WBC 2.8* 2.7* 2.9*  NEUTROABS 1.9  --   --   HGB 7.8* 6.9* 7.4*  HCT 24.9* 22.2* 22.9*  MCV 94.0 94.9 92.9  PLT 118* 120* 130*    Cardiac Enzymes: No results for input(s): CKTOTAL, TROPONINI in the last 168 hours.  BNP: Invalid input(s): POCBNP  CBG:  Recent Labs Lab 08/28/16 1801 08/28/16 2053 08/28/16 2304 08/29/16 0508 08/29/16 1205  GLUCAP 213* 279* 255* 272* 232*    Microbiology: Recent Results (from the past 720 hour(s))  MRSA PCR Screening     Status: None   Collection Time: 08/29/16  9:27 AM  Result Value Ref Range Status   MRSA by PCR NEGATIVE NEGATIVE Final     Comment:        The GeneXpert MRSA Assay (FDA approved for NASAL specimens only), is one component of a comprehensive MRSA colonization surveillance program. It is not intended to diagnose MRSA infection nor to guide or monitor treatment for MRSA infections.      Coagulation Studies: No results for input(s): LABPROT, INR in the last 72 hours.  Urinalysis: No results for input(s): COLORURINE, LABSPEC, PHURINE, GLUCOSEU, HGBUR, BILIRUBINUR, KETONESUR, PROTEINUR, UROBILINOGEN, NITRITE, LEUKOCYTESUR in the last 72 hours.  Invalid input(s): APPERANCEUR      Imaging:  No results found.   Assessment & Plan: Pt is a 51 y.o. caucasian female with End-stage renal disease, bipolar disorder, chronic systolic and diastolic congestive heart failure, EF 40%, chronic pain syndrome, depression, GERD, hepatitis C, hyperlipidemia, diabetes  [address] [place] [facility]/ TTS/ 3.5 hrs/ EDW ? 65 kg 1.  ESRD 2. AOCKD 3. LE Edema 4. SHPTH  Plan: Dialysis today Optimize volume and blood pressure Discontinue renvela, continue PhosLo per patient request Monitor phosphorus during hospital stay

## 2016-08-29 NOTE — BHH Suicide Risk Assessment (Signed)
[place] INPATIENT:  Family/Significant Other Suicide Prevention Education  Suicide Prevention Education:  Patient Refusal for Family/Significant Other Suicide Prevention Education: The patient [name] has refused to provide written consent for family/significant other to be provided Family/Significant Other Suicide Prevention Education during admission and/or prior to discharge.  Physician notified.  Emilie Rutter, MSW, LCSW-A 08/29/2016, 12:11 PM

## 2016-08-29 NOTE — Tx Team (Signed)
Initial Treatment Plan 08/29/2016 2:52 AM [name] DVO:[id number]    PATIENT STRESSORS: Health problems Medication change or noncompliance Occupational concerns Substance abuse   PATIENT STRENGTHS: Ability for insight Capable of independent living Supportive family/friends   PATIENT IDENTIFIED PROBLEMS: Mood Insatbility  Suicidal Ideation  Substance Abuse                 DISCHARGE CRITERIA:  Improved stabilization in mood, thinking, and/or behavior Motivation to continue treatment in a less acute level of care Reduction of life-threatening or endangering symptoms to within safe limits  PRELIMINARY DISCHARGE PLAN: Outpatient therapy Return to previous living arrangement  PATIENT/FAMILY INVOLVEMENT: This treatment plan has been presented to and reviewed with the patient, LAKINA MCINTIRE.  The patient has been given the opportunity to ask questions and make suggestions.  Electa Sniff, RN 08/29/2016, 2:52 AM

## 2016-08-29 NOTE — Progress Notes (Signed)
Inpatient Diabetes Program Recommendations  AACE/ADA: New Consensus Statement on Inpatient Glycemic Control (2015)  Target Ranges:  Prepandial:   less than 140 mg/dL      Peak postprandial:   less than 180 mg/dL (1-2 hours)      Critically ill patients:  140 - 180 mg/dL   Results for [name], [name] (MRN [id number]) as of 08/29/2016 09:31  Ref. Range 08/28/2016 06:28 08/28/2016 07:28 08/28/2016 09:25 08/28/2016 13:23 08/28/2016 18:01 08/28/2016 20:53 08/28/2016 23:04 08/29/2016 05:08  Glucose-Capillary Latest Ref Range: 65 - 99 mg/dL 589 (HH)  Novolog 25 units 596 (HH)  Novolog 20 units 286 (H)  Novolog 8 units 75 213 (H)  Novolog 5 units 279 (H)   255 (H)  Novolog 3 units  Lantus 25 units 272 (H)  Novolog 8 units   Review of Glycemic Control  Diabetes history: DM2 Outpatient Diabetes medications: Lantus 25 units QHS, Novolog 0-12 units TID with meals Current orders for Inpatient glycemic control: Lantus 25 units QHS, Novolog 3 units TID with meals, Novolog 0-15 units TID with meals, Novolog 0-5 units QHS  Inpatient Diabetes Program Recommendations: Insulin - Basal: Please consider increasing Lantus to 28 units QHS. Insulin - Meal Coverage: Please consider increasing meal coverage to Novolog 5 units TID with meals if patient eats at least 50% of meals.  Thanks, Barnie Alderman, RN, MSN, CDE Diabetes Coordinator Inpatient Diabetes Program [phone] (Team Pager from 8am to 5pm)

## 2016-08-29 NOTE — Progress Notes (Signed)
Start of hd 

## 2016-08-29 NOTE — Progress Notes (Signed)
Recreation Therapy Notes  Date: 01.25.18 Time: 9:30 am Location: Craft Room  Group Topic: Leisure Education  Goal Area(s) Addresses:  Patient will identify things they are grateful for. Patient will identify how being grateful can influenced decision making.  Behavioral Response: Attentive, Left early  Intervention: Grateful Wheel  Activity: Patients were given an I Am Grateful For worksheet and were instructed to write things they are grateful for under each category.  Education: LRT educated patients on leisure.  Education Outcome: Patient left before LRT educated group.  Clinical Observations/Feedback: Patient wrote things she was grateful for. Patient left group at approximately 9:43 am and did not return to group.  Leonette Monarch, LRT/CTRS 08/29/2016 10:04 AM

## 2016-08-29 NOTE — Consult Note (Signed)
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  Is on hemodialysis Wound type:Nonhealing neuropathic Pressure Injury POA: Yes Measurement:Right toe:  0.5 cm wound 100% dry scabbed lesion no drainage Right lateral lower leg near malleolus 1 cm x 1 cm lesion  Dry scabbed no drainage. Wound bed:100% devitalized tissue.  Dry and intact Drainage (amount, consistency, odor) none Periwound:intact  Dressing procedure/placement/frequency:Paint wounds to right great toe and right lateral lower leg with betadine swab daily.  Leave open to air.  Will not follow at this time.  Please re-consult if needed.  Domenic Moras RN BSN [place] Pager [phone]

## 2016-08-29 NOTE — H&P (Addendum)
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   MDD (major depressive disorder) Active Problems:   Seizure disorder (HCC)   CKD (chronic kidney disease) stage 5, GFR less than 15 ml/min (HCC)   Noncompliance   Anemia, iron deficiency   Chronic combined systolic and diastolic CHF (congestive heart failure) (HCC)   Unilateral visual loss   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 ([place])   transmetatarsal amputation of the left foot   Anemia of chronic kidney failure   Chronic pain syndrome   History of vitamin D deficiency   Dyslipidemia associated with type 2 diabetes mellitus (HCC)   Cocaine use disorder, severe, dependence ([place])   Hepatitis C   Diabetic ulcer of calf (HCC)   Diabetic ulcer  of toe ([place])   Cerebrovascular accident (CVA) ([place])  Long Term Goal(s): Improvement in symptoms so as ready for discharge  Short Term Goals: Ability to identify changes in lifestyle to reduce recurrence of condition will improve and Ability to demonstrate self-control will improve  I certify that inpatient services furnished can reasonably be expected to improve the patient's condition.    Hildred Priest, MD 1/25/20182:06 PM

## 2016-08-29 NOTE — Progress Notes (Signed)
Pre hd assessment  

## 2016-08-29 NOTE — Progress Notes (Signed)
Pre hd info 

## 2016-08-29 NOTE — Plan of Care (Signed)
Problem: Food- and Nutrition-Related Knowledge Deficit (NB-1.1) Goal: Nutrition education Formal process to instruct or train a patient/client in a skill or to impart knowledge to help patients/clients voluntarily manage or modify food choices and eating behavior to maintain or improve health. Outcome: Completed/Met Date Met: 08/29/16  RD consulted for nutrition education regarding diabetes.   Lab Results  Component Value Date   HGBA1C 8.7 (H) 07/21/2016   CBG: 75-596 since admission  Spoke with patient in day room. Patient reports she was diagnosed with DM 20 years ago but has not ever seen a Dietitian regarding carbohydrate counting. She reports she receives HD T/Th/Sa for the past 8 months and does see a Dietitian at dialysis. Patient reports due to pain and other factors she has been missing a lot of dialysis sessions since she cannot get out of bed. She reports she does not have a typical meal pattern and each day is different. She has trouble getting to a store to purchase foods so she eats what she can and occasionally her sister brings her food. She reports eating greens, soup, and chicken occasionally. Patient reports she watched the documentary "What the Health" and now does not want to eat meat or dairy products. This RD attempted to explain that that documentary is not to be trusted over recommendations from her RD and that she needs to consume meat to obtain the protein her body needs. Here, patient has been eating snacks consisting of crackers and soda, which has led to her elevated CBGs. Patient reports this is not typically a concern at home since she has limited access to food. After completing education with patient, RD assisted in ordering snacks TID that will be brought to unit from dining services.   RD provided "Carbohydrate Counting for People with Diabetes" handout from the Academy of Nutrition and Dietetics. Discussed different food groups and their effects on blood sugar,  emphasizing carbohydrate-containing foods. Provided list of carbohydrates and recommended serving sizes of common foods.  RD provided "Using Nutrition Labels: Carbohydrates" handout from the Academy of Nutrition and Dietetics. Reviewed how to read a food label by looking at serving size and grams of carbohydrate to determine how many carbohydrates that food contains. Encouraged use of handout and chart when eating foods that have a food label.  Discussed importance of controlled and consistent carbohydrate intake throughout the day. Provided examples of ways to balance meals/snacks and encouraged intake of high-fiber, complex carbohydrates. Teach back method used.  Expect fair compliance.  Body mass index is 29.21 kg/m. Pt meets criteria for Overweight based on current BMI.  Current diet order is Renal/Carbohydrate Modified, patient is consuming approximately 90-100% of meals at this time. Labs and medications reviewed. No further nutrition interventions warranted at this time. RD contact information provided. If additional nutrition issues arise, please re-consult RD.  Willey Blade, MS, RD, LDN Pager: [phone] After Hours Pager: [phone]

## 2016-08-29 NOTE — Progress Notes (Signed)
Chaplain received a consult to visit with pt in room 310. Pt had suicidal thoughts because her health was deteriorating.  Provided the ministry of prayer and provided a bible to the pt.    08/29/16 6770  Clinical Encounter Type  Visited With Patient  Visit Type Initial;Spiritual support  Referral From Nurse  Consult/Referral To Chaplain  Spiritual Encounters  Spiritual Needs Prayer

## 2016-08-29 NOTE — Progress Notes (Signed)
Post HD assessment unchanged  

## 2016-08-29 NOTE — Progress Notes (Signed)
HD completed without issue. 4L goal met. Patient tolerated well.

## 2016-08-30 LAB — GLUCOSE, CAPILLARY
GLUCOSE-CAPILLARY: 82 mg/dL (ref 65–99)
GLUCOSE-CAPILLARY: 187 mg/dL — AB (ref 65–99)
GLUCOSE-CAPILLARY: 194 mg/dL — AB (ref 65–99)
Glucose-Capillary: 33 mg/dL — CL (ref 65–99)
Glucose-Capillary: 204 mg/dL — ABNORMAL HIGH (ref 65–99)
Glucose-Capillary: 112 mg/dL — ABNORMAL HIGH (ref 65–99)

## 2016-08-30 MED ORDER — INSULIN GLARGINE 100 UNIT/ML ~~LOC~~ SOLN
22.0000 [IU] | Freq: Every day | SUBCUTANEOUS | Status: DC
  Administered 2016-08-30 – 2016-09-03 (×5): 22 [IU] via SUBCUTANEOUS
  Filled 2016-08-30 (×7): qty 0.22

## 2016-08-30 MED ORDER — POVIDONE-IODINE 10 % OINT PACKET
TOPICAL_OINTMENT | Freq: Every day | CUTANEOUS | Status: DC
  Administered 2016-08-31 – 2016-09-04 (×5): via TOPICAL
  Filled 2016-08-30 (×6): qty 1

## 2016-08-30 NOTE — Progress Notes (Signed)
HD STARTED  

## 2016-08-30 NOTE — [facility] Group Notes (Signed)
[facility] Notes:  (Nursing/MHT/Case Management/Adjunct)  Date:  08/30/2016  Time:  5:43 AM  Type of Therapy:  Psychoeducational Skills  Participation Level:  Did Not Attend  Summary of Progress/Problems:  Reece Agar 08/30/2016, 5:43 AM

## 2016-08-30 NOTE — Progress Notes (Signed)
Hemodialysis completed. 

## 2016-08-30 NOTE — Progress Notes (Signed)
Patient ID: [name], female   DOB: 1966-05-02, 50 y.o.   MRN: [id number] "I am exhausted, tired after hemodialysis, I am always tired and my paing won't go away! I have pain and soreness all over .Marland Kitchen.." Net UF 4,000 ml removed per chart record; A&Ox3, pleasant, isolated to room, medication compliant, CBG=156 at bedtime, Oxycodone IR 5 mg given again for 10/10 generalized aching, soreness and discomfort @ 0034; will continue to monitor.

## 2016-08-30 NOTE — Progress Notes (Signed)
Hypoglycemic Event  CBG: 33 @ 0440  Treatment: 15 GM carbohydrate snack  Symptoms: Sweaty, Hungry and Nervous/irritable  Follow-up CBG: IRWE:[id number] CBG Result:82  Possible Reasons for Event: Unknown  Comments/MD notified:Dr P contacted, will continue to monitor  A review of Lantus & SSI recommended  Fontella Shan T Avery [place]lingbeil

## 2016-08-30 NOTE — Progress Notes (Signed)
Inpatient Diabetes Program Recommendations  AACE/ADA: New Consensus Statement on Inpatient Glycemic Control (2015)  Target Ranges:  Prepandial:   less than 140 mg/dL      Peak postprandial:   less than 180 mg/dL (1-2 hours)      Critically ill patients:  140 - 180 mg/dL   Results for [name], [name] (MRN [id number]) as of 08/30/2016 07:37  Ref. Range 08/29/2016 05:08 08/29/2016 12:05 08/29/2016 17:10 08/29/2016 20:23 08/30/2016 04:40 08/30/2016 05:00 08/30/2016 06:57  Glucose-Capillary Latest Ref Range: 65 - 99 mg/dL 272 (H) 232 (H) 131 (H) 156 (H) 33 (LL) 82 204 (H)   Review of Glycemic Control  Diabetes history: DM2 Outpatient Diabetes medications: Lantus 25 units QHS, Novolog 0-12 units TID with meals Current orders for Inpatient glycemic control: Lantus 28 units QHS, Novolog 5 units TID with meals, Novolog 0-15 units TID with meals, Novolog 0-5 units QHS  Inpatient Diabetes Program Recommendations:  Insulin - Basal: Glucose down to 33 mg/dl at 4:40 am today. Patient received Lantus 28 units at bedtime on 1/26 and also noted patient received hemodialysis on 1/26 which was completed around 16:50. Please consider decreasing Lantus to 22 units QHS. Patient may end up needing 2 basal insulin dosages (one for HD days and one for non HD days). Inpatient Consult: May want to consider consulting hospitalist for assistance with glycemic control as Diabetes Coordinator will not be on campus over the weekend.  Thanks, Barnie Alderman, RN, MSN, CDE Diabetes Coordinator Inpatient Diabetes Program [phone] (Team Pager from 8am to 5pm)

## 2016-08-30 NOTE — Progress Notes (Signed)
Recreation Therapy Notes  Date: 01.26.18 Time: 9:30 am Location: Craft Room  Group Topic: Coping Skills  Goal Area(s) Addresses:  Patient will participate in healthy coping skill. Patient will verbalize benefit of using art as a coping skill.  Behavioral Response: Did not attend  Intervention: Coloring  Activity: Patients were given coloring sheets and were instructed to think about the emotions they were feeling and what their minds were focused on.  Education: LRT educated patients on healthy coping skills.  Education Outcome: Patient did not attend group.   Clinical Observations/Feedback: Patient did not attend group.  Leonette Monarch, LRT/CTRS 08/30/2016 10:22 AM

## 2016-08-30 NOTE — Progress Notes (Signed)
Post hd tx 

## 2016-08-30 NOTE — Progress Notes (Signed)
20:00: Patient in and out of her room. Walks frequently. Alert and oriented but anxious and restless. CBG 198. Has no major concern at this moment. Emotional support and encouragement provided. Therapeutic milieu promoted. Safety precautions reinforced.

## 2016-08-30 NOTE — Progress Notes (Signed)
Patient left to go to dialysis with sitter.

## 2016-08-30 NOTE — Plan of Care (Signed)
Problem: Activity: Goal: Sleeping patterns will improve Outcome: Progressing Patient slept for Estimated Hours of 6.15; every 15 minutes safety round maintained, no injury or falls during this shift.

## 2016-08-30 NOTE — Progress Notes (Signed)
Subjective:   Patient seen during dialysis Tolerating well   HEMODIALYSIS FLOWSHEET:  Blood Flow Rate (mL/min): 400 mL/min Arterial Pressure (mmHg): -110 mmHg Venous Pressure (mmHg): 160 mmHg Transmembrane Pressure (mmHg): 80 mmHg Ultrafiltration Rate (mL/min): 1190 mL/min Dialysate Flow Rate (mL/min): 800 ml/min Conductivity: Machine : 14 Conductivity: Machine : 14 Dialysis Fluid Bolus: Normal Saline Bolus Amount (mL): 250 mL Dialysate Change: 2K Intra-Hemodialysis Comments: 4035ml./..tx completed.    Objective:  Vital signs in last 24 hours:  Temp:  [97.5 F (36.4 C)-98 F (36.7 C)] 98 F (36.7 C) (01/26 1346) Pulse Rate:  [73-79] 74 (01/26 1346) Resp:  [16-25] 18 (01/26 1346) BP: (111-170)/(57-76) 137/76 (01/26 1346) SpO2:  [98 %-100 %] 100 % (01/26 0950) Weight:  [69.2 kg (152 lb 8.9 oz)-72.4 kg (159 lb 9.8 oz)] 69.2 kg (152 lb 8.9 oz) (01/26 1346)  Weight change: 1.903 kg (4 lb 3.1 oz) Filed Weights   08/29/16 1649 08/30/16 0950 08/30/16 1346  Weight: 72.4 kg (159 lb 9.8 oz) 72.2 kg (159 lb 2.8 oz) 69.2 kg (152 lb 8.9 oz)    Intake/Output:    Intake/Output Summary (Last 24 hours) at 08/30/16 1602 Last data filed at 08/30/16 1346  Gross per 24 hour  Intake              360 ml  Output             7500 ml  Net            -7140 ml     Physical Exam: General: No acute distress, sitting in dialysis chair   HEENT Pale conjunctiva, moist oral mucous membranes   Neck Supple   Pulm/lungs Normal breathing effort, decreased breath sounds at bases otherwise clear   CVS/Heart Regular rate and rhythm, no rub or gallop   Abdomen:  Soft, nontender, nondistended   Extremities: 1-2+ pitting edema bilaterally   Neurologic: Alert, oriented   Skin: No acute rashes   Access: Left arm AV graft        Basic Metabolic Panel:   Recent Labs Lab 08/26/16 0005 08/27/16 2009 08/29/16 1320  NA 130* 135 131*  K 5.1 3.9 4.0  CL 90* 94* 93*  CO2 25 27 27   GLUCOSE  583* 293* 256*  BUN 47* 17 64*  CREATININE 7.16* 3.14* 5.62*  CALCIUM 7.9* 8.3* 8.1*  PHOS  --  3.0 3.8     CBC:  Recent Labs Lab 08/26/16 0005 08/27/16 1320 08/29/16 1320  WBC 2.8* 2.7* 2.9*  NEUTROABS 1.9  --   --   HGB 7.8* 6.9* 7.4*  HCT 24.9* 22.2* 22.9*  MCV 94.0 94.9 92.9  PLT 118* 120* 130*      Microbiology:  Recent Results (from the past 720 hour(s))  MRSA PCR Screening     Status: None   Collection Time: 08/29/16  9:27 AM  Result Value Ref Range Status   MRSA by PCR NEGATIVE NEGATIVE Final    Comment:        The GeneXpert MRSA Assay (FDA approved for NASAL specimens only), is one component of a comprehensive MRSA colonization surveillance program. It is not intended to diagnose MRSA infection nor to guide or monitor treatment for MRSA infections.     Coagulation Studies: No results for input(s): LABPROT, INR in the last 72 hours.  Urinalysis: No results for input(s): COLORURINE, LABSPEC, PHURINE, GLUCOSEU, HGBUR, BILIRUBINUR, KETONESUR, PROTEINUR, UROBILINOGEN, NITRITE, LEUKOCYTESUR in the last 72 hours.  Invalid input(s): APPERANCEUR  Imaging: No results found.   Medications:    . amLODipine  10 mg Oral QHS  . atorvastatin  40 mg Oral QHS  . calcium acetate  1,334 mg Oral TID WC  . carvedilol  25 mg Oral BID WC  . cloNIDine  0.1 mg Oral Q8H  . doxercalciferol  1 mcg Intravenous Q T,Th,Sa-HD  . feeding supplement (PRO-STAT SUGAR FREE 64)  30 mL Oral BID  . furosemide  80 mg Oral BID  . insulin aspart  0-15 Units Subcutaneous TID WC  . insulin aspart  0-5 Units Subcutaneous QHS  . insulin aspart  5 Units Subcutaneous TID WC  . insulin glargine  22 Units Subcutaneous QHS  . multivitamin  1 tablet Oral QHS  . povidone-iodine   Topical Daily  . sertraline  50 mg Oral Daily  . traZODone  25 mg Oral QHS   acetaminophen, alum & mag hydroxide-simeth, ondansetron, oxyCODONE  Assessment/ Plan:  51 y.o.AA female with End-stage renal  disease, bipolar disorder, chronic systolic and diastolic congestive heart failure, EF 40%, chronic pain syndrome, depression, GERD, hepatitis C, hyperlipidemia, diabetes  [address] [place] [facility]/ TTS/ 3.5 hrs/ EDW ? 65 kg  1.  ESRD 2. AOCKD 3. LE Edema, Volume overload 4. SHPTH  Plan: Extra Dialysis today to address volume overload. So far, 7500 cc of fluid has been removed with dialysis in the last 2 days Optimize volume and blood pressure Discontinue renvela; continue PhosLo per patient request Monitor phosphorus during hospital stay Dialysis again tomorrow as it is her regular dialysis day  Down to 69 kg post HD (standing scale)   LOS: 2 Aryeh Butterfield 1/26/20184:02 PM

## 2016-08-30 NOTE — Progress Notes (Signed)
Report called to nurse, Patient had dialysis treatment completed for today.

## 2016-08-30 NOTE — Progress Notes (Signed)
[facility] MD Progress Note  08/30/2016 9:53 AM [name]  MRN:  [id number] Subjective:  [name] a 51 y.o.femalewith history of CHF, ESRF, DM and cerebrovascular disease and substance abuse presented to [facility] ER on 1/21 with multiple complaints in the setting of missed dialysis. Patient has multiple visits to the emergency department. She has history of poor compliance with medications and with dialysis.   Her main concern initially was shortness of breath however later on she also voiced depression secondary to the severity of her chronic medical issues. Patient complained of hating her quality of life. She complains of crying spells isolation, anhedonia and fatigue, irritability poor concentration insomnia and poor appetite and feelings of hopelessness, helplessness and worthlessness. She reported picking whole in her skin. She had thoughts about shooting herself. She reports a history of at least three previous suicide attempts by overdose.  Pt reported she lives with her sister and seventeen-year-old nephew in a two story house. Pt says her legs are weak and she has difficulty sometimes climbing stairs. She says she uses a cane or walker sometimes when ambulating. She says she feels like a burden. She has three children but they have told her they cannot care for her. She cannot identify anyone in her life who is supportive.   Patient blames doctors at [facility] for putting her on dialysis. She complaints that nobody cares about her problems.  Says she is suppose to be on on oxycodone and adderall and none of her doctors want to prescribe those to her. She says she is only using cocaine because she is in pain.   In addition to depression pt reports hearing voices at times but denied having hallucinations upon admission.  As far as substance abuse the patient has history of alcoholism but she denied any recent use. Her alcohol level was below the detection limit. Patient also has a  history of cocaine dependence. Urine toxicology was positive for cocaine. Patient stated that her last use was about 5 days ago.   1/26 patient had an episode of severe hypoglycemia last night around 4:30 AM. Her glucose was 33. Hypoglycemia resolved quickly. The recommendations have been received by the diabetes coordinator and Lantus will be decreased. Patient also appears to have fluid overload and even though she had dialysis yesterday she was to schedule again for dialysis today. Patient was seen this morning during dialysis. She reports sleeping well last night with the help of the trazodone. She feels that the medication causes her to feel sedated in the morning. Wonders if the medication can be given to her earlier. Patient says she doesn't know where she continues to have thoughts of suicide as she has been very sleepy and tired "I have not been awake enough". Patient is willing to continue zoloft and give it a try. He continues to request an increased dose of pain medication would like to have the opiates to schedule every 4 hours.  Per nursing: "I am exhausted, tired after hemodialysis, I am always tired and my paing won't go away! I have pain and soreness all over .Marland Kitchen.." Net UF 4,000 ml removed per chart record; A&Ox3, pleasant, isolated to room, medication compliant, CBG=156 at bedtime, Oxycodone IR 5 mg given again for 10/10 generalized aching, soreness and discomfort @ 0034; will continue to monitor.  Principal Problem: MDD (major depressive disorder) Diagnosis:   Patient Active Problem List   Diagnosis Date Noted  . Cocaine use disorder, severe, dependence ([place]) [F14.20]  08/29/2016  . Hepatitis C [B19.20] 08/29/2016  . Diabetic ulcer of calf ([place]) [L85.909, L97.209] 08/29/2016  . Diabetic ulcer of toe ([place]) [P11.216, K44.695] 08/29/2016  . Cerebrovascular accident (CVA) ([place]) [I63.9] 08/29/2016  . MDD (major depressive disorder) [F32.9] 08/28/2016  . Dyslipidemia associated with type  2 diabetes mellitus ([place]) [E11.69, E78.5] 11/10/2015  . History of vitamin D deficiency [Z86.39] 06/20/2015  . Long term prescription opiate use [Z79.891] 06/20/2015  . Chronic pain syndrome [G89.4] 06/19/2015  . Anemia of chronic kidney failure [N18.9, D63.1] 05/01/2015  . transmetatarsal amputation of the left foot [Z89.432] 12/13/2014  . Unilateral visual loss [H54.60] 11/22/2014  . 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 ([place]) [E11.9, Z79.4] 11/22/2014  . Chronic combined systolic and diastolic CHF (congestive heart failure) ([place]) [I50.42]   . Anemia, iron deficiency [D50.9] 04/13/2014  . Noncompliance [Z91.19] 02/02/2014  . CKD (chronic kidney disease) stage 5, GFR less than 15 ml/min (HCC) [N18.5] 05/18/2013  . Seizure disorder [facility]) [Q72.257] 02/09/2013   Total Time spent with patient: 30 minutes  Past Psychiatric History: Pt says she does not currently have any mental health providers. She cannot remember what psychiatric medications she was prescribed in the past. She reports [facility] for [facility] attempts.  Past Medical History:  Past Medical History:  Diagnosis Date  . Anemia   . Anxiety   . Bipolar 1 disorder ([place])   . Bipolar disorder, unspecified 02/06/2014  . Chronic combined systolic and diastolic CHF (congestive heart failure) (HCC)    EF 40% by echo and 48% by stress test  . Chronic pain syndrome   . Depression   . ESRD on hemodialysis ([place])   . GERD (gastroesophageal reflux disease)   . Glaucoma   . Headache    sinus headaches  . Hepatitis C   . High cholesterol   . History of hiatal hernia   . History of vitamin D deficiency 06/20/2015  . Hypertension   . MRSA infection ?2014   "on the back of my head; spread throughout my bloodstream"  . Neuropathy ([place])   . Refusal of blood transfusions as patient is Jehovah's Witness   . Schizoaffective disorder, unspecified  condition 02/08/2014  . Seizure [facility]) dx'd 2014   "don't know what kind; last one was ~ 04/2014"  . Stroke [facility])    TIA 2009  . TIA (transient ischemic attack) 2010  . Type II diabetes mellitus ([place])    INSULIN DEPENDENT    Past Surgical History:  Procedure Laterality Date  . ABDOMINAL HYSTERECTOMY  2014  . ABDOMINAL HYSTERECTOMY  2010  . AMPUTATION Left 05/21/2013   Procedure: Left Midfoot AMPUTATION;  Surgeon: Newt Minion, MD;  Location: [facility];  Service: Orthopedics;  Laterality: Left;  Left Midfoot Amputation  . AV FISTULA PLACEMENT Left 05/04/2015   Procedure: ARTERIOVENOUS (AV) GRAFT INSERTION;  Surgeon: Angelia Mould, MD;  Location: [place];  Service: Vascular;  Laterality: Left;  . COLONOSCOPY WITH PROPOFOL N/A 06/22/2013   Procedure: COLONOSCOPY WITH PROPOFOL;  Surgeon: Jeryl Columbia, MD;  Location: [facility] ENDOSCOPY;  Service: Endoscopy;  Laterality: N/A;  . ESOPHAGOGASTRODUODENOSCOPY N/A 05/11/2015   Procedure: ESOPHAGOGASTRODUODENOSCOPY (EGD);  Surgeon: Laurence Spates, MD;  Location: [facility] ENDOSCOPY;  Service: Endoscopy;  Laterality: N/A;  . EYE SURGERY Bilateral 2010   Lasik  . FOOT AMPUTATION Left    due diabetic neuropathy, could not feel ulcer on bottom of foot  .  REFRACTIVE SURGERY Bilateral 2013  . TONSILLECTOMY  1970's  . TUBAL LIGATION    . TUBAL LIGATION  2000   Family History:  Family History  Problem Relation Age of Onset  . Hypertension Mother   . Hypertension Father    Family Psychiatric  History:  Social History:  History  Alcohol Use  . 1.8 oz/week  . 3 Standard drinks or equivalent per week    Comment: 05/01/15 patient denies alcohol use.     History  Drug Use  . Types: "Crack" cocaine, Cocaine    Comment: last smoked crack 08/21/16    Social History   Social History  . Marital status: Single    Spouse name: N/A  . Number of children: N/A  . Years of education: N/A   Social History Main Topics  . Smoking status: Former Smoker    Packs/day:  1.00    Years: 20.00    Types: Cigarettes    Quit date: 03/31/2015  . Smokeless tobacco: Never Used     Comment: "stopped smoking in ~ 2013; restarted last week; stopped again 06/17/2014"  . Alcohol use 1.8 oz/week    3 Standard drinks or equivalent per week     Comment: 05/01/15 patient denies alcohol use.  . Drug use: Yes    Types: "Crack" cocaine, Cocaine     Comment: last smoked crack 08/21/16  . Sexual activity: Not Currently    Birth control/ protection: Abstinence   Other Topics Concern  . None   Social History Narrative   ** Merged History Encounter **         Current Medications: Current Facility-Administered Medications  Medication Dose Route Frequency Provider Last Rate Last Dose  . acetaminophen (TYLENOL) tablet 1,000 mg  1,000 mg Oral Q6H PRN  Hernandez-Gonzalez, MD      . alum & mag hydroxide-simeth (MAALOX/MYLANTA) 200-200-20 MG/5ML suspension 30 mL  30 mL Oral Q4H PRN  Hernandez-Gonzalez, MD      . amLODipine (NORVASC) tablet 10 mg  10 mg Oral QHS  Hernandez-Gonzalez, MD   10 mg at 08/29/16 2101  . atorvastatin (LIPITOR) tablet 40 mg  40 mg Oral QHS  Hernandez-Gonzalez, MD   40 mg at 08/29/16 2100  . calcium acetate (PHOSLO) capsule 1,334 mg  1,334 mg Oral TID WC  Hernandez-Gonzalez, MD   1,334 mg at 08/30/16 0900  . carvedilol (COREG) tablet 25 mg  25 mg Oral BID WC  Hernandez-Gonzalez, MD   25 mg at 08/30/16 0859  . cloNIDine (CATAPRES) tablet 0.1 mg  0.1 mg Oral Q8H  Hernandez-Gonzalez, MD   0.1 mg at 08/30/16 0649  . doxercalciferol (HECTOROL) injection 1 mcg  1 mcg Intravenous Q T,Th,Sa-HD  Hernandez-Gonzalez, MD   1 mcg at 08/29/16 1432  . feeding supplement (PRO-STAT SUGAR FREE 64) liquid 30 mL  30 mL Oral BID  Hernandez-Gonzalez, MD   30 mL at 08/30/16 0904  . furosemide (LASIX) tablet 80 mg  80 mg Oral BID Harmeet Singh, MD   80 mg at 08/30/16 0859  . insulin aspart (novoLOG) injection 0-15 Units  0-15  Units Subcutaneous TID WC  Hernandez-Gonzalez, MD   5 Units at 08/30/16 0904  . insulin aspart (novoLOG) injection 0-5 Units  0-5 Units Subcutaneous QHS  Hernandez-Gonzalez, MD   3 Units at 08/28/16 2317  . insulin aspart (novoLOG) injection 5 Units  5 Units Subcutaneous TID WC  Hernandez-Gonzalez, MD   5 Units at 08/29/16 1716  . insulin glargine (LANTUS) injection   22 Units  22 Units Subcutaneous QHS  Hernandez-Gonzalez, MD      . multivitamin (RENA-VIT) tablet 1 tablet  1 tablet Oral QHS  Hernandez-Gonzalez, MD   1 tablet at 08/29/16 2200  . ondansetron (ZOFRAN) tablet 4 mg  4 mg Oral Q8H PRN  Hernandez-Gonzalez, MD      . oxyCODONE (Oxy IR/ROXICODONE) immediate release tablet 5 mg  5 mg Oral Q6H PRN  Hernandez-Gonzalez, MD   5 mg at 08/30/16 0034  . sertraline (ZOLOFT) tablet 50 mg  50 mg Oral Daily  Hernandez-Gonzalez, MD   50 mg at 08/29/16 1712  . traZODone (DESYREL) tablet 25 mg  25 mg Oral QHS  Hernandez-Gonzalez, MD   25 mg at 08/29/16 2200    Lab Results:  Results for orders placed or performed during the hospital encounter of 08/28/16 (from the past 48 hour(s))  Glucose, capillary     Status: Abnormal   Collection Time: 08/28/16 11:04 PM  Result Value Ref Range   Glucose-Capillary 255 (H) 65 - 99 mg/dL  Glucose, capillary     Status: Abnormal   Collection Time: 08/29/16  5:08 AM  Result Value Ref Range   Glucose-Capillary 272 (H) 65 - 99 mg/dL  MRSA PCR Screening     Status: None   Collection Time: 08/29/16  9:27 AM  Result Value Ref Range   MRSA by PCR NEGATIVE NEGATIVE    Comment:        The GeneXpert MRSA Assay (FDA approved for NASAL specimens only), is one component of a comprehensive MRSA colonization surveillance program. It is not intended to diagnose MRSA infection nor to guide or monitor treatment for MRSA infections.   Glucose, capillary     Status: Abnormal   Collection Time: 08/29/16 12:05 PM  Result  Value Ref Range   Glucose-Capillary 232 (H) 65 - 99 mg/dL  CBC     Status: Abnormal   Collection Time: 08/29/16  1:20 PM  Result Value Ref Range   WBC 2.9 (L) 3.6 - 11.0 K/uL   RBC 2.47 (L) 3.80 - 5.20 MIL/uL   Hemoglobin 7.4 (L) 12.0 - 16.0 g/dL   HCT 22.9 (L) 35.0 - 47.0 %   MCV 92.9 80.0 - 100.0 fL   MCH 30.1 26.0 - 34.0 pg   MCHC 32.4 32.0 - 36.0 g/dL   RDW 18.5 (H) 11.5 - 14.5 %   Platelets 130 (L) 150 - 440 K/uL  Renal function panel     Status: Abnormal   Collection Time: 08/29/16  1:20 PM  Result Value Ref Range   Sodium 131 (L) 135 - 145 mmol/L   Potassium 4.0 3.5 - 5.1 mmol/L   Chloride 93 (L) 101 - 111 mmol/L   CO2 27 22 - 32 mmol/L   Glucose, Bld 256 (H) 65 - 99 mg/dL   BUN 64 (H) 6 - 20 mg/dL   Creatinine, Ser 5.62 (H) 0.44 - 1.00 mg/dL   Calcium 8.1 (L) 8.9 - 10.3 mg/dL   Phosphorus 3.8 2.5 - 4.6 mg/dL   Albumin 2.8 (L) 3.5 - 5.0 g/dL   GFR calc non Af Amer 8 (L) >60 mL/min   GFR calc Af Amer 9 (L) >60 mL/min    Comment: (NOTE) The eGFR has been calculated using the CKD EPI equation. This calculation has not been validated in all clinical situations. eGFR's persistently <60 mL/min signify possible Chronic Kidney Disease.    Anion gap 11 5 - 15  Glucose, capillary       Status: Abnormal   Collection Time: 08/29/16  5:10 PM  Result Value Ref Range   Glucose-Capillary 131 (H) 65 - 99 mg/dL  Glucose, capillary     Status: Abnormal   Collection Time: 08/29/16  8:23 PM  Result Value Ref Range   Glucose-Capillary 156 (H) 65 - 99 mg/dL  Glucose, capillary     Status: Abnormal   Collection Time: 08/30/16  4:40 AM  Result Value Ref Range   Glucose-Capillary 33 (LL) 65 - 99 mg/dL  Glucose, capillary     Status: None   Collection Time: 08/30/16  5:00 AM  Result Value Ref Range   Glucose-Capillary 82 65 - 99 mg/dL  Glucose, capillary     Status: Abnormal   Collection Time: 08/30/16  6:57 AM  Result Value Ref Range   Glucose-Capillary 204 (H) 65 - 99 mg/dL     Blood Alcohol level:  Lab Results  Component Value Date   ETH <5 08/26/2016   ETH <5 10/27/2014    Metabolic Disorder Labs: Lab Results  Component Value Date   HGBA1C 8.7 (H) 07/21/2016   MPG 203 07/21/2016   MPG 249 05/05/2015   No results found for: PROLACTIN Lab Results  Component Value Date   CHOL 124 12/13/2015   TRIG 54 12/13/2015   HDL 102 (A) 12/13/2015   CHOLHDL 3.1 05/10/2015   VLDL 12 05/10/2015   LDLCALC 11 12/13/2015   LDLCALC 36 05/10/2015    Physical Findings: AIMS:  , ,  ,  ,    CIWA:    COWS:     Musculoskeletal: Strength & Muscle Tone: decreased Gait & Station: unsteady Patient leans: N/A  Psychiatric Specialty Exam: Physical Exam  Constitutional: She is oriented to person, place, and time. She appears well-developed and well-nourished.  HENT:  Head: Atraumatic.  Eyes: EOM are normal.  Neck: Normal range of motion.  Respiratory: Effort normal.  Musculoskeletal: Normal range of motion.  Walks with walker  Neurological: She is alert and oriented to person, place, and time.    Review of Systems  Constitutional: Positive for malaise/fatigue.  HENT: Negative.   Eyes: Negative.   Respiratory: Negative.   Cardiovascular: Negative.   Gastrointestinal: Negative.   Genitourinary: Negative.   Musculoskeletal: Positive for joint pain.  Skin: Negative.   Neurological: Negative.   Endo/Heme/Allergies: Negative.   Psychiatric/Behavioral: Positive for depression and substance abuse.    Blood pressure 127/63, pulse 79, temperature 97.5 F (36.4 C), temperature source Oral, resp. rate 20, height 5' 4" (1.626 m), weight 72.4 kg (159 lb 9.8 oz), SpO2 100 %.Body mass index is 27.4 kg/m.  General Appearance: Fairly Groomed  Eye Contact:  Good  Speech:  Clear and Coherent  Volume:  Normal  Mood:  Dysphoric  Affect:  Appropriate and Congruent  Thought Process:  Linear and Descriptions of Associations: Intact  Orientation:  Full (Time, Place, and  Person)  Thought Content:  Hallucinations: None  Suicidal Thoughts:  No  Homicidal Thoughts:  No  Memory:  Immediate;   Fair Recent;   Fair Remote;   Fair  Judgement:  Poor  Insight:  Shallow  Psychomotor Activity:  Decreased  Concentration:  Concentration: Fair and Attention Span: Fair  Recall:  Fair  Fund of Knowledge:  Fair  Language:  Good  Akathisia:  No  Handed:    AIMS (if indicated):     Assets:  Communication Skills Financial Resources/Insurance Housing  ADL's:  Intact  Cognition:  WNL  Sleep:  Number   of Hours: 6.15     Treatment Plan Summary:  Patient is a 51 year old African-American female with a multitude of medical problems along with depression and substance abuse. Patient reports that her mood has worsened significantly since she was started on dialysis about a month ago. She complains of how having chronic pain and not receiving the appropriate treatment for it. She requests to be started back on Adderall which helped her in the past and OxyContin which was prescribed to her for pain in the past she feels that with the right medications she can move on with her life.  Patient has been in the emergency department a multitude of times. She has history of substance abuse mainly cocaine use, and noncompliance with dialysis, insulin or antihypertensives. There is documentation of her having a MI transportation and also a stroke. Also has diabetic ulcers and already suffered an amputation of her left foot.  In 2017 the patient was placed in a assisted living facility after the surgery on her foot but left after a while. We have offered the patient assistance with placing her back in a assisted living facility where she can receive assistance but patient refuses. Says that she is planning on leaving to [place] in about a month to live with another sister.   Major depressive disorder the patient  has been started onsertraline 50 mg by mouth daily. The sertraline will be  given after dialysis.  Insomnia: For insomnia continue trazodone 25 mg by mouth daily but will give those a little earlier to decrease daytime sedation   Hypertension continue Norvasc 10 mg by mouth daily, continue Coreg 25 mg twice a day. Continue clonidine 0.1 mg every 8 hours.  Congestive heart failure continue Lasix 120 mg by mouth daily  Endstage renal fair on dialysis: Nephrology has been consulted/seen on 1/25. Last dialysis was given on January 25.  Due to fluid overload the patient also received dialysis today.  Hyperphosphatemia continue PhosLo 1334 mg 3 times a day   Diabetes: Patient has been started on a diabetic diet. Orders have been given for supplemental insulin. Hypoglicemic at 4 am this morning. Will decrease lantus to 22 as per DM coordinator recommendations. Continue novolog 5 U tid. Last Hba1c in dec 2017 8.7  Diabetic ulcers: Patient has been found to have an ulcer in her toe and calf. Seen by wound care on 1/25. Recommended betadine daily to ulcers  Chronic pain: Patient is currently not follow-up by a primary care doctor and does not have pain management either. She is actively abusing substances. She does appear to have clear causes for chronic pain. She has an old fracture in her right elbow that has not being repaired surgically because of her low hemoglobin and platelet count. She also had amputation of her left foot due to poorly controlled diabetes. Very likely she also suffers from diabetic neuropathy. For now we'll start her on roxicodone 5 mg every 6 hours when necessary. She told me she has a follow-up with a new primary care doctor at [facility] on February 14.  Dyslipidemia continue Lipitor 40 mg by mouth daily  Tobacco use disorder continue nicotine patch 14 mg a day  Unsteady gait patient to use a walker. She has been placed on fall precautions  Diet: Patient is on a renal diet with low carbohydrates. Seen by Dietician on 1/25  Vital signs  are to be checked twice a day  Precautions every 15 minute checks. Patient is also on fall precautions  Disposition: will return to live with sister in [place].  Pt has offered assistance with placement in an ALF but refuses.   Follow-up: will need to be set up to f/u with psychiatrist in [place].  Will order home health. Has a f/u with [facility] in Feb for primary care.  Records from prior ER and outpt visits have been reviewed.  Spoke with nephrology, and dietician today.  Case discussed with nursing and SW.   Possible d/c in the next 5 days.   Hildred Priest, MD 08/30/2016, 9:53 AM

## 2016-08-30 NOTE — Progress Notes (Signed)
Pre-hd tx 

## 2016-08-30 NOTE — [facility] Group Notes (Signed)
[facility] LCSW Group Therapy Note  Date/Time: 08/30/16 1300  Type of Therapy and Topic:  Group Therapy:  Feelings around Relapse and Recovery  Participation Level:  Did not attend  Mood:  Description of Group:    Patients in this group will discuss emotions they experience before and after a relapse. They will process how experiencing these feelings, or avoidance of experiencing them, relates to having a relapse. Facilitator will guide patients to explore emotions they have related to recovery. Patients will be encouraged to process which emotions are more powerful. They will be guided to discuss the emotional reaction significant others in their lives may have to patients' relapse or recovery. Patients will be assisted in exploring ways to respond to the emotions of others without this contributing to a relapse.  Therapeutic Goals: 1. Patient will identify two or more emotions that lead to relapse for them:  2. Patient will identify two emotions that result when they relapse:  3. Patient will identify two emotions related to recovery:  4. Patient will demonstrate ability to communicate their needs through discussion and/or role plays.   Summary of Patient Progress:        Therapeutic Modalities:   Cognitive Behavioral Therapy Solution-Focused Therapy Assertiveness Training Relapse Prevention Therapy   Lurline Idol, LCSW

## 2016-08-30 NOTE — Progress Notes (Signed)
Patient has been resting on and off, medicated once for leg pain with improved results, Patient has had good appetite today, blood sugars have been stable this shift, she is cooperative and states that she feels better, smiles at nurse, Patient with q 15 minute checks. Patient denies Si/hi or avh.

## 2016-08-30 NOTE — Tx Team (Signed)
Interdisciplinary Treatment and Diagnostic Plan Update  08/30/2016 Time of Session: 10:30 AM [name] MRN: [id number]  Principal Diagnosis: MDD (major depressive disorder)  Secondary Diagnoses: Principal Problem:   MDD (major depressive disorder) Active Problems:   Seizure disorder ([place])   CKD (chronic kidney disease) stage 5, GFR less than 15 ml/min (HCC)   Noncompliance   Anemia, iron deficiency   Chronic combined systolic and diastolic CHF (congestive heart failure) (HCC)   Unilateral visual loss   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 ([place])   transmetatarsal amputation of the left foot   Anemia of chronic kidney failure   Chronic pain syndrome   History of vitamin D deficiency   Dyslipidemia associated with type 2 diabetes mellitus (HCC)   Cocaine use disorder, severe, dependence ([place])   Hepatitis C   Diabetic ulcer of calf (HCC)   Diabetic ulcer of toe ([place])   Cerebrovascular accident (CVA) ([place])   Current Medications:  Current Facility-Administered Medications  Medication Dose Route Frequency Provider Last Rate Last Dose  . acetaminophen (TYLENOL) tablet 1,000 mg  1,000 mg Oral Q6H PRN Hildred Priest, MD      . alum & mag hydroxide-simeth (MAALOX/MYLANTA) 200-200-20 MG/5ML suspension 30 mL  30 mL Oral Q4H PRN Hildred Priest, MD      . amLODipine (NORVASC) tablet 10 mg  10 mg Oral QHS Hildred Priest, MD   10 mg at 08/29/16 2101  . atorvastatin (LIPITOR) tablet 40 mg  40 mg Oral QHS Hildred Priest, MD   40 mg at 08/29/16 2100  . calcium acetate (PHOSLO) capsule 1,334 mg  1,334 mg Oral TID WC Hildred Priest, MD   1,334 mg at 08/30/16 0900  . carvedilol (COREG) tablet 25 mg  25 mg Oral BID WC Hildred Priest, MD   25 mg at 08/30/16 0859  . cloNIDine (CATAPRES) tablet 0.1 mg  0.1 mg Oral Q8H Hildred Priest, MD   0.1 mg at 08/30/16 0649  . doxercalciferol (HECTOROL) injection 1 mcg  1 mcg  Intravenous Q T,Th,Sa-HD Hildred Priest, MD   1 mcg at 08/29/16 1432  . feeding supplement (PRO-STAT SUGAR FREE 64) liquid 30 mL  30 mL Oral BID Hildred Priest, MD   30 mL at 08/30/16 0904  . furosemide (LASIX) tablet 80 mg  80 mg Oral BID Murlean Iba, MD   80 mg at 08/30/16 0859  . insulin aspart (novoLOG) injection 0-15 Units  0-15 Units Subcutaneous TID WC Hildred Priest, MD   5 Units at 08/30/16 0904  . insulin aspart (novoLOG) injection 0-5 Units  0-5 Units Subcutaneous QHS Hildred Priest, MD   3 Units at 08/28/16 2317  . insulin aspart (novoLOG) injection 5 Units  5 Units Subcutaneous TID WC Hildred Priest, MD   5 Units at 08/29/16 1716  . insulin glargine (LANTUS) injection 22 Units  22 Units Subcutaneous QHS Hildred Priest, MD      . multivitamin (RENA-VIT) tablet 1 tablet  1 tablet Oral QHS Hildred Priest, MD   1 tablet at 08/29/16 2200  . ondansetron (ZOFRAN) tablet 4 mg  4 mg Oral Q8H PRN Hildred Priest, MD      . oxyCODONE (Oxy IR/ROXICODONE) immediate release tablet 5 mg  5 mg Oral Q6H PRN Hildred Priest, MD   5 mg at 08/30/16 0034  . sertraline (ZOLOFT) tablet 50 mg  50 mg Oral Daily Hildred Priest, MD   50 mg at 08/29/16 1712  . traZODone (DESYREL) tablet 25 mg  25 mg  Oral QHS Hildred Priest, MD   25 mg at 08/29/16 2200   PTA Medications: Prescriptions Prior to Admission  Medication Sig Dispense Refill Last Dose  . acetaminophen (TYLENOL) 325 MG tablet Take 2 tablets (650 mg total) by mouth every 6 (six) hours as needed for mild pain (or Fever >/= 101).   unk  . Amino Acids-Protein Hydrolys (FEEDING SUPPLEMENT, PRO-STAT SUGAR FREE 64,) LIQD Take 30 mLs by mouth 2 (two) times daily. 900 mL 0 Past Week at Unknown time  . amLODipine (NORVASC) 10 MG tablet Take 1 tablet (10 mg total) by mouth at bedtime. 30 tablet 0 Past Week at Unknown time  . atorvastatin  (LIPITOR) 40 MG tablet Take 1 tablet (40 mg total) by mouth at bedtime. 30 tablet 0 Past Week at Unknown time  . B Complex-C-Folic Acid (RENAL VITAMIN PO) Take 1 tablet by mouth at bedtime.   Past Week at Unknown time  . calcium acetate (PHOSLO) 667 MG capsule Take 2 capsules (1,334 mg total) by mouth 3 (three) times daily with meals. 120 capsule 0 Past Week at Unknown time  . carvedilol (COREG) 25 MG tablet Take 25 mg by mouth 2 (two) times daily with a meal.  3 08/25/2016 at 1600  . cloNIDine (CATAPRES) 0.1 MG tablet Take 1 tablet (0.1 mg total) by mouth every 8 (eight) hours. 90 tablet 0 08/25/2016 at Unknown time  . diphenhydrAMINE (BENADRYL) 25 mg capsule Take 1 capsule (25 mg total) by mouth every 6 (six) hours as needed for itching. 30 capsule 0 unk  . folic acid (FOLVITE) 1 MG tablet Take 1 tablet (1 mg total) by mouth 2 (two) times daily with a meal. 60 tablet 0 Past Week at Unknown time  . furosemide (LASIX) 40 MG tablet Take 120 mg by mouth daily.   08/25/2016 at Unknown time  . HYDROcodone-acetaminophen (NORCO/VICODIN) 5-325 MG tablet Take 1-2 tablets by mouth every 6 (six) hours as needed. (Patient not taking: Reported on 08/27/2016) 10 tablet 0 Not Taking at Unknown time  . insulin aspart (NOVOLOG) 100 UNIT/ML injection Inject 0-12 Units into the skin 3 (three) times daily before meals. Per sliding scale inject SQ <150=0units, 151-200=2 units, 201-250=4units, 251-300=6units,301-350=8 units, 351-400=10 units, >/= 401=12 units (Patient taking differently: Inject 2-12 Units into the skin 3 (three) times daily before meals. Inject 2 units subcutaneously daily before meals plus sliding scale adjustment based on CBG) 10 mL 0 Past Week at Unknown time  . insulin glargine (LANTUS) 100 UNIT/ML injection Inject 0.25 mLs (25 Units total) into the skin at bedtime.   Past Week at Unknown time  . lidocaine-prilocaine (EMLA) cream Apply 1 application topically See admin instructions. Apply to dialysis site one  hour prior to dialysis   Past Week at Unknown time  . LORazepam (ATIVAN) 0.5 MG tablet Take 0.5 mg by mouth See admin instructions. Take 1 tablet (0.5 mg) by mouth prior to dialysis and at bedtime as needed for sleep   Past Week at Unknown time  . polyethylene glycol (MIRALAX / GLYCOLAX) packet Take 17 g by mouth daily. 14 each 0 Past Week at Unknown time  . polyvinyl alcohol (LIQUIFILM TEARS) 1.4 % ophthalmic solution Place 2 drops into both eyes every 6 (six) hours as needed for dry eyes.    unk  . senna-docusate (SENOKOT-S) 8.6-50 MG tablet Take 1 tablet by mouth 2 (two) times daily. 30 tablet 0 Past Week at Unknown time  . sevelamer carbonate (RENVELA) 800 MG tablet Take  3 tablets (2,400 mg total) by mouth 3 (three) times daily with meals. 270 tablet 0 Past Week at Unknown time    Patient Stressors: Health problems Medication change or noncompliance Occupational concerns Substance abuse  Patient Strengths: Ability for insight Capable of independent living Supportive family/friends  Treatment Modalities: Medication Management, Group therapy, Case management,  1 to 1 session with clinician, Psychoeducation, Recreational therapy.   Physician Treatment Plan for Primary Diagnosis: MDD (major depressive disorder) Long Term Goal(s): Improvement in symptoms so as ready for discharge Improvement in symptoms so as ready for discharge   Short Term Goals: Ability to identify changes in lifestyle to reduce recurrence of condition will improve Ability to verbalize feelings will improve Ability to disclose and discuss suicidal ideas Ability to demonstrate self-control will improve Ability to identify and develop effective coping behaviors will improve Compliance with prescribed medications will improve Ability to identify triggers associated with substance abuse/mental health issues will improve Ability to identify changes in lifestyle to reduce recurrence of condition will improve Ability to  demonstrate self-control will improve  Medication Management: Evaluate patient's response, side effects, and tolerance of medication regimen.  Therapeutic Interventions: 1 to 1 sessions, Unit Group sessions and Medication administration.  Evaluation of Outcomes: Progressing  Physician Treatment Plan for Secondary Diagnosis: Principal Problem:   MDD (major depressive disorder) Active Problems:   Seizure disorder (HCC)   CKD (chronic kidney disease) stage 5, GFR less than 15 ml/min (HCC)   Noncompliance   Anemia, iron deficiency   Chronic combined systolic and diastolic CHF (congestive heart failure) (HCC)   Unilateral visual loss   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 ([place])   transmetatarsal amputation of the left foot   Anemia of chronic kidney failure   Chronic pain syndrome   History of vitamin D deficiency   Dyslipidemia associated with type 2 diabetes mellitus (HCC)   Cocaine use disorder, severe, dependence ([place])   Hepatitis C   Diabetic ulcer of calf (HCC)   Diabetic ulcer of toe ([place])   Cerebrovascular accident (CVA) ([place])  Long Term Goal(s): Improvement in symptoms so as ready for discharge Improvement in symptoms so as ready for discharge   Short Term Goals: Ability to identify changes in lifestyle to reduce recurrence of condition will improve Ability to verbalize feelings will improve Ability to disclose and discuss suicidal ideas Ability to demonstrate self-control will improve Ability to identify and develop effective coping behaviors will improve Compliance with prescribed medications will improve Ability to identify triggers associated with substance abuse/mental health issues will improve Ability to identify changes in lifestyle to reduce recurrence of condition will improve Ability to demonstrate self-control will improve     Medication Management: Evaluate patient's response, side effects, and tolerance of medication regimen.  Therapeutic  Interventions: 1 to 1 sessions, Unit Group sessions and Medication administration.  Evaluation of Outcomes: Progressing   RN Treatment Plan for Primary Diagnosis: MDD (major depressive disorder) Long Term Goal(s): Knowledge of disease and therapeutic regimen to maintain health will improve  Short Term Goals: Ability to remain free from injury will improve, Ability to demonstrate self-control, Ability to participate in decision making will improve, Ability to disclose and discuss suicidal ideas and Compliance with prescribed medications will improve  Medication Management: RN will administer medications as ordered by provider, will assess and evaluate patient's response and provide education to patient for prescribed medication. RN will report any adverse and/or side effects to prescribing provider.  Therapeutic Interventions: 1 on 1 counseling  sessions, Psychoeducation, Medication administration, Evaluate responses to treatment, Monitor vital signs and CBGs as ordered, Perform/monitor CIWA, COWS, AIMS and Fall Risk screenings as ordered, Perform wound care treatments as ordered.  Evaluation of Outcomes: Progressing   LCSW Treatment Plan for Primary Diagnosis: MDD (major depressive disorder) Long Term Goal(s): Safe transition to appropriate next level of care at discharge, Engage patient in therapeutic group addressing interpersonal concerns.  Short Term Goals: Engage patient in aftercare planning with referrals and resources, Increase social support, Increase ability to appropriately verbalize feelings, Identify triggers associated with mental health/substance abuse issues and Increase skills for wellness and recovery  Therapeutic Interventions: Assess for all discharge needs, 1 to 1 time with Social worker, Explore available resources and support systems, Assess for adequacy in community support network, Educate family and significant other(s) on suicide prevention, Complete Psychosocial  Assessment, Interpersonal group therapy.  Evaluation of Outcomes: Progressing   Progress in Treatment: Attending groups: No. Participating in groups: No. Taking medication as prescribed: Yes. Toleration medication: Yes. Family/Significant other contact made: No, will contact:  Pt refused family contact. Patient understands diagnosis: Yes. Discussing patient identified problems/goals with staff: Yes. Medical problems stabilized or resolved: Yes. Denies suicidal/homicidal ideation: Yes. Issues/concerns per patient self-inventory: No.  New problem(s) identified: No, Describe:  None identified.  Discharge Plan or Barriers: Pt refused ALF placement.  Reason for Continuation of Hospitalization: Anxiety Depression Suicidal ideation  Estimated Length of Stay: 3-5 days   Attendees: Patient: [name]  08/30/2016 10:52 AM  Physician: Dr. Merlyn Albert, MD  08/30/2016 10:52 AM  Nursing: Elige Radon, BSN, RN 08/30/2016 10:52 AM  RN Care Manager: 08/30/2016 10:52 AM  Social Worker: Glorious Peach, MSW, LCSW-A 08/30/2016 10:52 AM  Recreational Therapist: Everitt Amber, LRT, [place]  08/30/2016 10:52 AM    Scribe for Treatment Team: Emilie Rutter, [place] 08/30/2016 10:52 AM

## 2016-08-31 LAB — GLUCOSE, CAPILLARY
GLUCOSE-CAPILLARY: 209 mg/dL — AB (ref 65–99)
GLUCOSE-CAPILLARY: 200 mg/dL — AB (ref 65–99)
Glucose-Capillary: 401 mg/dL — ABNORMAL HIGH (ref 65–99)
Glucose-Capillary: 321 mg/dL — ABNORMAL HIGH (ref 65–99)
Glucose-Capillary: 81 mg/dL (ref 65–99)
Glucose-Capillary: 116 mg/dL — ABNORMAL HIGH (ref 65–99)

## 2016-08-31 LAB — TSH: TSH: 2.58 u[IU]/mL (ref 0.350–4.500)

## 2016-08-31 MED ORDER — LORAZEPAM 2 MG/ML IJ SOLN
2.0000 mg | INTRAMUSCULAR | Status: DC
  Administered 2016-09-03: 2 mg via INTRAVENOUS
  Filled 2016-08-31: qty 1

## 2016-08-31 NOTE — Progress Notes (Signed)
Pre HD  

## 2016-08-31 NOTE — Progress Notes (Signed)
Pre HD assessment  

## 2016-08-31 NOTE — Progress Notes (Signed)
[facility] MD Progress Note  08/31/2016 1:23 PM KILEIGH ORTMANN  MRN:  [id number] Subjective:  [name] a 51 y.o.femalewith history of CHF, ESRF, DM and cerebrovascular disease and substance abuse presented to [facility] ER on 1/21 with multiple complaints in the setting of missed dialysis. Patient has multiple visits to the emergency department. She has history of poor compliance with medications and with dialysis.   Her main concern initially was shortness of breath however later on she also voiced depression secondary to the severity of her chronic medical issues. Patient complained of hating her quality of life. She complains of crying spells isolation, anhedonia and fatigue, irritability poor concentration insomnia and poor appetite and feelings of hopelessness, helplessness and worthlessness. She reported picking whole in her skin. She had thoughts about shooting herself. She reports a history of at least three previous suicide attempts by overdose.  Pt reported she lives with her sister and seventeen-year-old nephew in a two story house. Pt says her legs are weak and she has difficulty sometimes climbing stairs. She says she uses a cane or walker sometimes when ambulating. She says she feels like a burden. She has three children but they have told her they cannot care for her. She cannot identify anyone in her life who is supportive.   Patient blames doctors at [facility] for putting her on dialysis. She complaints that nobody cares about her problems.  Says she is suppose to be on on oxycodone and adderall and none of her doctors want to prescribe those to her. She says she is only using cocaine because she is in pain.   In addition to depression pt reports hearing voices at times but denied having hallucinations upon admission.  As far as substance abuse the patient has history of alcoholism but she denied any recent use. Her alcohol level was below the detection limit. Patient also has a  history of cocaine dependence. Urine toxicology was positive for cocaine. Patient stated that her last use was about 5 days ago.   1/26 patient had an episode of severe hypoglycemia last night around 4:30 AM. Her glucose was 33. Hypoglycemia resolved quickly. The recommendations have been received by the diabetes coordinator and Lantus will be decreased. Patient also appears to have fluid overload and even though she had dialysis yesterday she was to schedule again for dialysis today. Patient was seen this morning during dialysis. She reports sleeping well last night with the help of the trazodone. She feels that the medication causes her to feel sedated in the morning. Wonders if the medication can be given to her earlier. Patient says she doesn't know where she continues to have thoughts of suicide as she has been very sleepy and tired "I have not been awake enough". Patient is willing to continue zoloft and give it a try. He continues to request an increased dose of pain medication would like to have the opiates to schedule every 4 hours.  1/27 patient was seen this morning. She is a schedule for dialysis once again today. This is the 4rd time this week. Patient had fluid overload and had an extra dialysis on Friday. She tells me that he's been very difficult for her to adjust to dialysis. She dislikes it very much. She will continue doing dialysis for now with the hope that she one day will no longer required the dialysis. She tells me she continues to have on and off thoughts about suicide. He had thoughts of suicide earlier  this morning she says that every time she has dialysis she starts thinking about suicide. She denies auditory or visual hallucinations. He tells me that she has been is sleeping better with the trazodone. She denies any other issues or concerns. Denies side effects.  Per nursing: Patient chose to end treatment with 1 hour 51 minutes remaining. "I just cant do no more this week."  Patient currently denies complaints. Had one episode of symptomatic hypotension, resolved with saline bolus.   0700: patient's CBG 401. MD was called: recommended to give  Scheduled sliding scale. 15 units were given. MD to reach out Diabetes Consult today. Patient currently in room, calm and cooperative. Safety precautions maintained.   Principal Problem: MDD (major depressive disorder) Diagnosis:   Patient Active Problem List   Diagnosis Date Noted  . Cocaine use disorder, severe, dependence ([place]) [F14.20] 08/29/2016  . Hepatitis C [B19.20] 08/29/2016  . Diabetic ulcer of calf ([place]) [S06.301, L97.209] 08/29/2016  . Diabetic ulcer of toe ([place]) [S01.093, A35.573] 08/29/2016  . Cerebrovascular accident (CVA) ([place]) [I63.9] 08/29/2016  . MDD (major depressive disorder) [F32.9] 08/28/2016  . Dyslipidemia associated with type 2 diabetes mellitus ([place]) [E11.69, E78.5] 11/10/2015  . History of vitamin D deficiency [Z86.39] 06/20/2015  . Long term prescription opiate use [Z79.891] 06/20/2015  . Chronic pain syndrome [G89.4] 06/19/2015  . Anemia of chronic kidney failure [N18.9, D63.1] 05/01/2015  . transmetatarsal amputation of the left foot [Z89.432] 12/13/2014  . Unilateral visual loss [H54.60] 11/22/2014  . 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 ([place]) [E11.9, Z79.4] 11/22/2014  . Chronic combined systolic and diastolic CHF (congestive heart failure) ([place]) [I50.42]   . Anemia, iron deficiency [D50.9] 04/13/2014  . Noncompliance [Z91.19] 02/02/2014  . CKD (chronic kidney disease) stage 5, GFR less than 15 ml/min (HCC) [N18.5] 05/18/2013  . Seizure disorder [facility]) [U20.254] 02/09/2013   Total Time spent with patient: 30 minutes  Past Psychiatric History: Pt says she does not currently have any mental health providers. She cannot remember what psychiatric medications she was prescribed in the past. She reports [facility]  [facility] for suicide attempts.  Past Medical History:  Past Medical History:  Diagnosis Date  . Anemia   . Anxiety   . Bipolar 1 disorder ([place])   . Bipolar disorder, unspecified 02/06/2014  . Chronic combined systolic and diastolic CHF (congestive heart failure) (HCC)    EF 40% by echo and 48% by stress test  . Chronic pain syndrome   . Depression   . ESRD on hemodialysis ([place])   . GERD (gastroesophageal reflux disease)   . Glaucoma   . Headache    sinus headaches  . Hepatitis C   . High cholesterol   . History of hiatal hernia   . History of vitamin D deficiency 06/20/2015  . Hypertension   . MRSA infection ?2014   "on the back of my head; spread throughout my bloodstream"  . Neuropathy ([place])   . Refusal of blood transfusions as patient is Jehovah's Witness   . Schizoaffective disorder, unspecified condition 02/08/2014  . Seizure [facility]) dx'd 2014   "don't know what kind; last one was ~ 04/2014"  . Stroke [facility])    TIA 2009  . TIA (transient ischemic attack) 2010  . Type II diabetes mellitus ([place])    INSULIN DEPENDENT    Past Surgical History:  Procedure Laterality Date  . ABDOMINAL HYSTERECTOMY  2014  . ABDOMINAL HYSTERECTOMY  2010  . AMPUTATION Left 05/21/2013   Procedure: Left Midfoot AMPUTATION;  Surgeon: Newt Minion, MD;  Location: [place];  Service: Orthopedics;  Laterality: Left;  Left Midfoot Amputation  . AV FISTULA PLACEMENT Left 05/04/2015   Procedure: ARTERIOVENOUS (AV) GRAFT INSERTION;  Surgeon: Angelia Mould, MD;  Location: [place];  Service: Vascular;  Laterality: Left;  . COLONOSCOPY WITH PROPOFOL N/A 06/22/2013   Procedure: COLONOSCOPY WITH PROPOFOL;  Surgeon: Jeryl Columbia, MD;  Location: [facility] ENDOSCOPY;  Service: Endoscopy;  Laterality: N/A;  . ESOPHAGOGASTRODUODENOSCOPY N/A 05/11/2015   Procedure: ESOPHAGOGASTRODUODENOSCOPY (EGD);  Surgeon: Laurence Spates, MD;  Location: [facility] ENDOSCOPY;  Service: Endoscopy;  Laterality: N/A;  . EYE SURGERY  Bilateral 2010   Lasik  . FOOT AMPUTATION Left    due diabetic neuropathy, could not feel ulcer on bottom of foot  . REFRACTIVE SURGERY Bilateral 2013  . TONSILLECTOMY  1970's  . TUBAL LIGATION    . TUBAL LIGATION  2000   Family History:  Family History  Problem Relation Age of Onset  . Hypertension Mother   . Hypertension Father    Family Psychiatric  History:  Social History:  History  Alcohol Use  . 1.8 oz/week  . 3 Standard drinks or equivalent per week    Comment: 05/01/15 patient denies alcohol use.     History  Drug Use  . Types: "Crack" cocaine, Cocaine    Comment: last smoked crack 08/21/16    Social History   Social History  . Marital status: Single    Spouse name: N/A  . Number of children: N/A  . Years of education: N/A   Social History Main Topics  . Smoking status: Former Smoker    Packs/day: 1.00    Years: 20.00    Types: Cigarettes    Quit date: 03/31/2015  . Smokeless tobacco: Never Used     Comment: "stopped smoking in ~ 2013; restarted last week; stopped again 06/17/2014"  . Alcohol use 1.8 oz/week    3 Standard drinks or equivalent per week     Comment: 05/01/15 patient denies alcohol use.  . Drug use: Yes    Types: "Crack" cocaine, Cocaine     Comment: last smoked crack 08/21/16  . Sexual activity: Not Currently    Birth control/ protection: Abstinence   Other Topics Concern  . None   Social History Narrative   ** Merged History Encounter **         Current Medications: Current Facility-Administered Medications  Medication Dose Route Frequency Provider Last Rate Last Dose  . acetaminophen (TYLENOL) tablet 1,000 mg  1,000 mg Oral Q6H PRN Hildred Priest, MD      . alum & mag hydroxide-simeth (MAALOX/MYLANTA) 200-200-20 MG/5ML suspension 30 mL  30 mL Oral Q4H PRN Hildred Priest, MD      . amLODipine (NORVASC) tablet 10 mg  10 mg Oral QHS Hildred Priest, MD   10 mg at 08/30/16 2138  . atorvastatin  (LIPITOR) tablet 40 mg  40 mg Oral QHS Hildred Priest, MD   40 mg at 08/30/16 2136  . calcium acetate (PHOSLO) capsule 1,334 mg  1,334 mg Oral TID WC Hildred Priest, MD   1,334 mg at 08/31/16 1204  . carvedilol (COREG) tablet 25 mg  25 mg Oral BID WC Hildred Priest, MD   25 mg at 08/31/16 0742  . cloNIDine (CATAPRES) tablet 0.1 mg  0.1 mg Oral Q8H Hildred Priest, MD   0.1 mg at 08/31/16 0656  .  doxercalciferol (HECTOROL) injection 1 mcg  1 mcg Intravenous Q T,Th,Sa-HD Hildred Priest, MD   1 mcg at 08/31/16 1057  . feeding supplement (PRO-STAT SUGAR FREE 64) liquid 30 mL  30 mL Oral BID Hildred Priest, MD   30 mL at 08/31/16 1028  . furosemide (LASIX) tablet 80 mg  80 mg Oral BID Murlean Iba, MD   80 mg at 08/31/16 0742  . insulin aspart (novoLOG) injection 0-15 Units  0-15 Units Subcutaneous TID WC Hildred Priest, MD   15 Units at 08/31/16 [phone]  . insulin aspart (novoLOG) injection 0-5 Units  0-5 Units Subcutaneous QHS Hildred Priest, MD   3 Units at 08/28/16 2317  . insulin aspart (novoLOG) injection 5 Units  5 Units Subcutaneous TID WC Hildred Priest, MD   5 Units at 08/31/16 1242  . insulin glargine (LANTUS) injection 22 Units  22 Units Subcutaneous QHS Hildred Priest, MD   22 Units at 08/30/16 2140  . multivitamin (RENA-VIT) tablet 1 tablet  1 tablet Oral QHS Hildred Priest, MD   1 tablet at 08/30/16 2136  . ondansetron (ZOFRAN) tablet 4 mg  4 mg Oral Q8H PRN Hildred Priest, MD      . oxyCODONE (Oxy IR/ROXICODONE) immediate release tablet 5 mg  5 mg Oral Q6H PRN Hildred Priest, MD   5 mg at 08/30/16 1443  . povidone-iodine (BETADINE) 10 % ointment   Topical Daily Hildred Priest, MD      . sertraline (ZOLOFT) tablet 50 mg  50 mg Oral Daily Hildred Priest, MD   50 mg at 08/30/16 1443  . traZODone (DESYREL) tablet 25 mg  25 mg Oral QHS  Hildred Priest, MD   25 mg at 08/30/16 2143    Lab Results:  Results for orders placed or performed during the hospital encounter of 08/28/16 (from the past 48 hour(s))  Glucose, capillary     Status: Abnormal   Collection Time: 08/29/16  5:10 PM  Result Value Ref Range   Glucose-Capillary 131 (H) 65 - 99 mg/dL  Glucose, capillary     Status: Abnormal   Collection Time: 08/29/16  8:23 PM  Result Value Ref Range   Glucose-Capillary 156 (H) 65 - 99 mg/dL  Glucose, capillary     Status: Abnormal   Collection Time: 08/30/16  4:40 AM  Result Value Ref Range   Glucose-Capillary 33 (LL) 65 - 99 mg/dL  Glucose, capillary     Status: None   Collection Time: 08/30/16  5:00 AM  Result Value Ref Range   Glucose-Capillary 82 65 - 99 mg/dL  Glucose, capillary     Status: Abnormal   Collection Time: 08/30/16  6:57 AM  Result Value Ref Range   Glucose-Capillary 204 (H) 65 - 99 mg/dL  Glucose, capillary     Status: Abnormal   Collection Time: 08/30/16  2:36 PM  Result Value Ref Range   Glucose-Capillary 112 (H) 65 - 99 mg/dL  Glucose, capillary     Status: Abnormal   Collection Time: 08/30/16  5:17 PM  Result Value Ref Range   Glucose-Capillary 187 (H) 65 - 99 mg/dL  Glucose, capillary     Status: Abnormal   Collection Time: 08/30/16  8:35 PM  Result Value Ref Range   Glucose-Capillary 194 (H) 65 - 99 mg/dL   Comment 1 Notify RN    Comment 2 Document in Chart   Glucose, capillary     Status: Abnormal   Collection Time: 08/31/16  6:40 AM  Result Value Ref  Range   Glucose-Capillary 401 (H) 65 - 99 mg/dL   Comment 1 Notify RN    Comment 2 Document in Chart   TSH     Status: None   Collection Time: 08/31/16  6:50 AM  Result Value Ref Range   TSH 2.580 0.350 - 4.500 uIU/mL    Comment: Performed by a 3rd Generation assay with a functional sensitivity of <=0.01 uIU/mL.  Glucose, capillary     Status: Abnormal   Collection Time: 08/31/16  7:45 AM  Result Value Ref Range    Glucose-Capillary 321 (H) 65 - 99 mg/dL  Glucose, capillary     Status: Abnormal   Collection Time: 08/31/16  8:46 AM  Result Value Ref Range   Glucose-Capillary 209 (H) 65 - 99 mg/dL  Glucose, capillary     Status: None   Collection Time: 08/31/16 12:02 PM  Result Value Ref Range   Glucose-Capillary 81 65 - 99 mg/dL    Blood Alcohol level:  Lab Results  Component Value Date   ETH <5 08/26/2016   ETH <5 73/53/2992    Metabolic Disorder Labs: Lab Results  Component Value Date   HGBA1C 8.7 (H) 07/21/2016   MPG 203 07/21/2016   MPG 249 05/05/2015   No results found for: PROLACTIN Lab Results  Component Value Date   CHOL 124 12/13/2015   TRIG 54 12/13/2015   HDL 102 (A) 12/13/2015   CHOLHDL 3.1 05/10/2015   VLDL 12 05/10/2015   LDLCALC 11 12/13/2015   LDLCALC 36 05/10/2015    Physical Findings: AIMS:  , ,  ,  ,    CIWA:    COWS:     Musculoskeletal: Strength & Muscle Tone: decreased Gait & Station: unsteady Patient leans: N/A  Psychiatric Specialty Exam: Physical Exam  Constitutional: She is oriented to person, place, and time. She appears well-developed and well-nourished.  HENT:  Head: Atraumatic.  Eyes: EOM are normal.  Neck: Normal range of motion.  Respiratory: Effort normal.  Musculoskeletal: Normal range of motion.  Walks with walker  Neurological: She is alert and oriented to person, place, and time.    Review of Systems  Constitutional: Positive for malaise/fatigue.  HENT: Negative.   Eyes: Negative.   Respiratory: Negative.   Cardiovascular: Negative.   Gastrointestinal: Negative.   Genitourinary: Negative.   Musculoskeletal: Positive for joint pain.  Skin: Negative.   Neurological: Negative.   Endo/Heme/Allergies: Negative.   Psychiatric/Behavioral: Positive for depression, substance abuse and suicidal ideas.    Blood pressure (!) 141/69, pulse 74, temperature 98.2 F (36.8 C), resp. rate 15, height 5\' 4"  (1.626 m), weight 67.9 kg (149  lb 11.1 oz), SpO2 100 %.Body mass index is 25.69 kg/m.  General Appearance: Fairly Groomed  Eye Contact:  Good  Speech:  Clear and Coherent  Volume:  Normal  Mood:  Dysphoric  Affect:  Appropriate and Congruent  Thought Process:  Linear and Descriptions of Associations: Intact  Orientation:  Full (Time, Place, and Person)  Thought Content:  Hallucinations: None  Suicidal Thoughts:  Yes.  without intent/plan  Homicidal Thoughts:  No  Memory:  Immediate;   Fair Recent;   Fair Remote;   Fair  Judgement:  Poor  Insight:  Shallow  Psychomotor Activity:  Decreased  Concentration:  Concentration: Fair and Attention Span: Fair  Recall:  [employer] of Knowledge:  Fair  Language:  Good  Akathisia:  No  Handed:    AIMS (if indicated):     Assets:  Agricultural consultant Housing  ADL's:  Intact  Cognition:  WNL  Sleep:  Number of Hours: 6.45     Treatment Plan Summary:  Patient is a 51 year old African-American female with a multitude of medical problems along with depression and substance abuse. Patient reports that her mood has worsened significantly since she was started on dialysis about a month ago. She complains of how having chronic pain and not receiving the appropriate treatment for it. She requests to be started back on Adderall which helped her in the past and OxyContin which was prescribed to her for pain in the past she feels that with the right medications she can move on with her life.  Patient has been in the emergency department a multitude of times. She has history of substance abuse mainly cocaine use, and noncompliance with dialysis, insulin or antihypertensives. There is documentation of her having a MI transportation and also a stroke. Also has diabetic ulcers and already suffered an amputation of her left foot.  In 2017 the patient was placed in a assisted living facility after the surgery on her foot but left after a while. We have offered  the patient assistance with placing her back in a assisted living facility where she can receive assistance but patient refuses. Says that she is planning on leaving to [place] in about a month to live with another sister.   Major depressive disorder the patient  has been started onsertraline 50 mg by mouth daily. The sertraline will be given after dialysis.  Insomnia: For insomnia continue trazodone 25 mg by mouth at bed time  Hypertension continue Norvasc 10 mg by mouth daily, continue Coreg 25 mg twice a day. Continue clonidine 0.1 mg every 8 hours.  Congestive heart failure continue Lasix 120 mg by mouth daily  Endstage renal fair on dialysis: Nephrology has been consulted/seen on 1/25. Last dialysis was given on January 25.  Due to fluid overload the patient also received dialysis today.  Hyperphosphatemia continue PhosLo 1334 mg 3 times a day   Diabetes: Patient has been started on a diabetic diet. Orders have been given for supplemental insulin. Hypoglicemic at 4 am on 2/50. . Lantus was decreased to 22 U. Continue novolog 5 U tid. Last Hba1c in dec 2017 8.7 this morning her glucose was 401. I will order him a hospitalist consult.  Diabetic ulcers: Patient has been found to have an ulcer in her toe and calf. Seen by wound care on 1/25. Recommended betadine daily to ulcers  Chronic pain: Patient is currently not follow-up by a primary care doctor and does not have pain management either. She is actively abusing substances. She does appear to have clear causes for chronic pain. She has an old fracture in her right elbow that has not being repaired surgically because of her low hemoglobin and platelet count. She also had amputation of her left foot due to poorly controlled diabetes. Very likely she also suffers from diabetic neuropathy. For now we'll start her on roxicodone 5 mg every 6 hours when necessary. She told me she has a follow-up with a new primary care doctor at [facility]  on February 14.  Dyslipidemia continue Lipitor 40 mg by mouth daily  Tobacco use disorder continue nicotine patch 14 mg a day  Unsteady gait patient to use a walker. She has been placed on fall precautions  Diet: Patient is on a renal diet with low carbohydrates. Seen by Dietician on 1/25  Vital signs are to be  checked twice a day  Precautions every 15 minute checks. Patient is also on fall precautions  Disposition: will return to live with sister in [place].  Pt has offered assistance with placement in an ALF but refuses.   Follow-up: will need to be set up to f/u with psychiatrist in [place].  Will order home health. Has a f/u with [facility] in Feb for primary care.  Records from prior ER and outpt visits have been reviewed.  Spoke with nephrology, and dietician today.  Case discussed with nursing and SW.   Possible d/c in the next 3- 5 days.   Hildred Priest, MD 08/31/2016, 1:23 PM

## 2016-08-31 NOTE — H&P (Signed)
[name] spent most of shift in bed resting. She had minimal interaction with peers and staff. She reports still being depressed, she was medication compliant, will continue to monitor patient. No behavioral issues to report on shit at this time.

## 2016-08-31 NOTE — Plan of Care (Signed)
Problem: Activity: Goal: Risk for activity intolerance will decrease Outcome: Progressing Patient walks around as tolerated

## 2016-08-31 NOTE — Progress Notes (Signed)
HD initiated without issue  

## 2016-08-31 NOTE — Progress Notes (Signed)
Patient chose to end treatment with 1 hour 51 minutes remaining. "I just cant do no more this week." Patient currently denies complaints. Had one episode of symptomatic hypotension, resolved with saline bolus.

## 2016-08-31 NOTE — Progress Notes (Signed)
Post HD assessment unchanged  

## 2016-08-31 NOTE — Plan of Care (Signed)
Problem: Coping: Goal: Ability to demonstrate self-control will improve Outcome: Progressing Pt voices desire to eat healthier and walk more today. Is seen walking in hallways. Blood sugar better at lunch.

## 2016-08-31 NOTE — BHH Group Notes (Signed)
[place] Group Notes:  (Nursing/MHT/Case Management/Adjunct)  Date:  08/31/2016  Time:  5:55 AM  Type of Therapy:  Psychoeducational Skills  Participation Level:  Did Not Attend   Summary of Progress/Problems:  Reece Agar 08/31/2016, 5:55 AM

## 2016-08-31 NOTE — Progress Notes (Signed)
Pt awake and alert today. Appropriately interacts with staff/peers. Continues to express depression and "poor quality of life" related to dialysis treatments. Denies SI/HI/AVH. States, "i've just got to get my mental health and my pain under control. Pt voiced desire to eat better, stay healthier, and walk more today. Did not attend group due to being tired/weak after dialysis. Appetite good, eats meals. Good hygiene with adequate ADLs. Pt is medication compliant.   Support and encouragement provided with use of therapeutic communication. Medications administered as ordered with education. Safety maintained with every 15 minute checks. Will continue to monitor.

## 2016-08-31 NOTE — Plan of Care (Signed)
Problem: Coping: Goal: Ability to verbalize frustrations and anger appropriately will improve Outcome: Progressing Able to verbalize her thoughts and feelings as needed

## 2016-08-31 NOTE — BHH Group Notes (Signed)
[place] LCSW Group Therapy  08/31/2016 2:08 PM (Note Entry for Claudine M. Bandi, LCSW)  Type of Therapy:  Group Therapy  Participation Level:  Patient did not attend group. CSW invited patient to group.   Summary of Progress/Problems: Balance in life: Patients will discuss the concept of balance and how it looks and feels to be unbalanced. Pt will identify areas in their life that is unbalanced and ways to become more balanced. They discussed what aspects in their lives has influenced their self care. Patients also discussed self care in the areas of self regulation/control, hygiene/appearance, sleep/relaxation, healthy leisure, healthy eating habits, exercise, inner peace/spirituality, self improvement, sobriety, and health management. They were challenged to identify changes that are needed in order to improve self care.  Scribe for Group Therapy Note Nicolis Boody [place]. [place], [employer] [place] 08/31/2016, 2:09 PM

## 2016-08-31 NOTE — BHH Group Notes (Signed)
[place] Group Notes:  (Nursing/MHT/Case Management/Adjunct)  Date:  08/31/2016  Time:  10:43 PM  Type of Therapy:  Evening Wrap-up Group  Participation Level:  Did Not Attend  Participation Quality:  N/A  Affect:  N/A  Cognitive:  N/A  Insight:  None  Engagement in Group:  Did Not Attend  Modes of Intervention:  Discussion  Summary of Progress/Problems:  Levonne Spiller 08/31/2016, 10:43 PM

## 2016-08-31 NOTE — Progress Notes (Signed)
Subjective:   Patient seen during dialysis Tolerating well   HEMODIALYSIS FLOWSHEET:  Blood Flow Rate (mL/min): 400 mL/min Arterial Pressure (mmHg): -80 mmHg Venous Pressure (mmHg): 220 mmHg Transmembrane Pressure (mmHg): 60 mmHg Ultrafiltration Rate (mL/min): 630 mL/min Dialysate Flow Rate (mL/min): 800 ml/min Conductivity: Machine : 13.5 Conductivity: Machine : 13.5 Dialysis Fluid Bolus: Normal Saline Bolus Amount (mL): 100 mL Dialysate Change:  (3k 2.5ca) Intra-Hemodialysis Comments: 1760 mL removed. Patient chose to end treatment early d/t not feeling well. "I just cant do any more this week. " will notify md    Objective:  Vital signs in last 24 hours:  Temp:  [98 F (36.7 C)-98.9 F (37.2 C)] 98.2 F (36.8 C) (01/27 1057) Pulse Rate:  [73-80] 74 (01/27 1114) Resp:  [15-20] 15 (01/27 1114) BP: (103-166)/(52-81) 141/69 (01/27 1114) SpO2:  [100 %] 100 % (01/27 1114) Weight:  [67.9 kg (149 lb 11.1 oz)-71.2 kg (156 lb 15.5 oz)] 67.9 kg (149 lb 11.1 oz) (01/27 1057)  Weight change: -5 kg (-11 lb 0.4 oz) Filed Weights   08/30/16 1346 08/31/16 0907 08/31/16 1057  Weight: 69.2 kg (152 lb 8.9 oz) 71.2 kg (156 lb 15.5 oz) 67.9 kg (149 lb 11.1 oz)    Intake/Output:    Intake/Output Summary (Last 24 hours) at 08/31/16 1253 Last data filed at 08/31/16 1057  Gross per 24 hour  Intake              480 ml  Output             4660 ml  Net            -4180 ml     Physical Exam: General: No acute distress, sitting in dialysis chair   HEENT Pale conjunctiva, moist oral mucous membranes   Neck Supple   Pulm/lungs Normal breathing effort,    CVS/Heart Regular rate and rhythm, no rub or gallop   Abdomen:  Soft, nontender, nondistended   Extremities: trace pitting edema bilaterally   Neurologic: Alert, oriented   Skin: No acute rashes   Access: Left arm AV graft        Basic Metabolic Panel:   Recent Labs Lab 08/26/16 0005 08/27/16 2009 08/29/16 1320  NA 130*  135 131*  K 5.1 3.9 4.0  CL 90* 94* 93*  CO2 25 27 27   GLUCOSE 583* 293* 256*  BUN 47* 17 64*  CREATININE 7.16* 3.14* 5.62*  CALCIUM 7.9* 8.3* 8.1*  PHOS  --  3.0 3.8     CBC:  Recent Labs Lab 08/26/16 0005 08/27/16 1320 08/29/16 1320  WBC 2.8* 2.7* 2.9*  NEUTROABS 1.9  --   --   HGB 7.8* 6.9* 7.4*  HCT 24.9* 22.2* 22.9*  MCV 94.0 94.9 92.9  PLT 118* 120* 130*      Microbiology:  Recent Results (from the past 720 hour(s))  MRSA PCR Screening     Status: None   Collection Time: 08/29/16  9:27 AM  Result Value Ref Range Status   MRSA by PCR NEGATIVE NEGATIVE Final    Comment:        The GeneXpert MRSA Assay (FDA approved for NASAL specimens only), is one component of a comprehensive MRSA colonization surveillance program. It is not intended to diagnose MRSA infection nor to guide or monitor treatment for MRSA infections.     Coagulation Studies: No results for input(s): LABPROT, INR in the last 72 hours.  Urinalysis: No results for input(s): COLORURINE, LABSPEC, [place], [name], [place],  BILIRUBINUR, KETONESUR, PROTEINUR, UROBILINOGEN, NITRITE, LEUKOCYTESUR in the last 72 hours.  Invalid input(s): APPERANCEUR    Imaging: No results found.   Medications:    . amLODipine  10 mg Oral QHS  . atorvastatin  40 mg Oral QHS  . calcium acetate  1,334 mg Oral TID WC  . carvedilol  25 mg Oral BID WC  . cloNIDine  0.1 mg Oral Q8H  . doxercalciferol  1 mcg Intravenous Q T,Th,Sa-HD  . feeding supplement (PRO-STAT SUGAR FREE 64)  30 mL Oral BID  . furosemide  80 mg Oral BID  . insulin aspart  0-15 Units Subcutaneous TID WC  . insulin aspart  0-5 Units Subcutaneous QHS  . insulin aspart  5 Units Subcutaneous TID WC  . insulin glargine  22 Units Subcutaneous QHS  . multivitamin  1 tablet Oral QHS  . povidone-iodine   Topical Daily  . sertraline  50 mg Oral Daily  . traZODone  25 mg Oral QHS   acetaminophen, alum & mag hydroxide-simeth, ondansetron,  oxyCODONE  Assessment/ Plan:  51 y.o.AA female with End-stage renal disease, bipolar disorder, chronic systolic and diastolic congestive heart failure, EF 40%, chronic pain syndrome, depression, GERD, hepatitis C, hyperlipidemia, diabetes  [address] [place] [facility]/ TTS/ 3.5 hrs/ EDW  69 kg  1. ESRD 2. AOCKD 3. LE Edema, Volume overload 4. SHPTH  Plan: Routine Dialysis today to address volume overload. Patient stopped her treatment early due to cramping So far, 8500 cc of fluid has been removed with dialysis in the last 3 days  Her  Discontinue renvela; continue PhosLo per patient request Monitor phosphorus during hospital stay EDW is more likely 68-69 kg  Will set that as her target weight while in patient   LOS: 3 [name] 1/27/201812:53 PM

## 2016-08-31 NOTE — Progress Notes (Signed)
Patient has been sleeping. No sign of discomfort. Safety precautions maintained.

## 2016-08-31 NOTE — Progress Notes (Signed)
0700: patient's CBG 401. MD was called: recommended to give  Scheduled sliding scale. 15 units were given. MD to reach out Diabetes Consult today. Patient currently in room, calm and cooperative. Safety precautions maintained.

## 2016-09-01 LAB — GLUCOSE, CAPILLARY
GLUCOSE-CAPILLARY: 174 mg/dL — AB (ref 65–99)
GLUCOSE-CAPILLARY: 97 mg/dL (ref 65–99)
GLUCOSE-CAPILLARY: 113 mg/dL — AB (ref 65–99)
Glucose-Capillary: 274 mg/dL — ABNORMAL HIGH (ref 65–99)

## 2016-09-01 MED ORDER — HALOPERIDOL 0.5 MG PO TABS
0.5000 mg | ORAL_TABLET | Freq: Three times a day (TID) | ORAL | Status: DC
  Administered 2016-09-01 – 2016-09-02 (×5): 0.5 mg via ORAL
  Filled 2016-09-01 (×6): qty 1

## 2016-09-01 NOTE — Plan of Care (Signed)
Problem: Medication: Goal: Compliance with prescribed medication regimen will improve Outcome: Progressing Pt is compliant with medications. Knowledgeable about medication regimen prescribed. Verbalizes understanding.

## 2016-09-01 NOTE — [facility] Group Notes (Signed)
[facility] LCSW Group Therapy  1/28/20181:00 PM(Note Entry for [name] Ghazarian M. Milayah Krell, LCSW)  Type of Therapy: Group Therapy  Participation Level: Patient did not attend group. CSW invited patient to group.   Summary of Progress/Problems:The topic for group today was emotional regulation. This group focused on both positive and negative emotion identification and allowed group members to process ways to identify feelings, regulate negative emotions, and find healthy ways to manage internal/external emotions. Group members were asked to reflect on a time when their reaction to an emotion led to a negative outcome and explored how alternative responses using emotion regulation would have benefited them. Group members were also asked to discuss a time when emotion regulation was utilized when a negative emotion was experienced.   Wafa Martes LCSW  

## 2016-09-01 NOTE — Progress Notes (Signed)
[facility] MD Progress Note  09/01/2016 1:22 PM [name]  MRN:  [id number] Subjective:  [name] a 51 y.o.femalewith history of CHF, ESRF, DM and cerebrovascular disease and substance abuse presented to [facility] ER on 1/21 with multiple complaints in the setting of missed dialysis. Patient has multiple visits to the emergency department. She has history of poor compliance with medications and with dialysis.   Her main concern initially was shortness of breath however later on she also voiced depression secondary to the severity of her chronic medical issues. Patient complained of hating her quality of life. She complains of crying spells isolation, anhedonia and fatigue, irritability poor concentration insomnia and poor appetite and feelings of hopelessness, helplessness and worthlessness. She reported picking whole in her skin. She had thoughts about shooting herself. She reports a history of at least three previous suicide attempts by overdose.  Pt reported she lives with her sister and seventeen-year-old nephew in a two story house. Pt says her legs are weak and she has difficulty sometimes climbing stairs. She says she uses a cane or walker sometimes when ambulating. She says she feels like a burden. She has three children but they have told her they cannot care for her. She cannot identify anyone in her life who is supportive.   Patient blames doctors at [facility] for putting her on dialysis. She complaints that nobody cares about her problems.  Says she is suppose to be on on oxycodone and adderall and none of her doctors want to prescribe those to her. She says she is only using cocaine because she is in pain.   In addition to depression pt reports hearing voices at times but denied having hallucinations upon admission.  As far as substance abuse the patient has history of alcoholism but she denied any recent use. Her alcohol level was below the detection limit. Patient also has a  history of cocaine dependence. Urine toxicology was positive for cocaine. Patient stated that her last use was about 5 days ago.   1/26 patient had an episode of severe hypoglycemia last night around 4:30 AM. Her glucose was 33. Hypoglycemia resolved quickly. The recommendations have been received by the diabetes coordinator and Lantus will be decreased. Patient also appears to have fluid overload and even though she had dialysis yesterday she was to schedule again for dialysis today. Patient was seen this morning during dialysis. She reports sleeping well last night with the help of the trazodone. She feels that the medication causes her to feel sedated in the morning. Wonders if the medication can be given to her earlier. Patient says she doesn't know where she continues to have thoughts of suicide as she has been very sleepy and tired "I have not been awake enough". Patient is willing to continue zoloft and give it a try. He continues to request an increased dose of pain medication would like to have the opiates to schedule every 4 hours.  1/27 patient was seen this morning. She is a schedule for dialysis once again today. This is the 4rd time this week. Patient had fluid overload and had an extra dialysis on Friday. She tells me that he's been very difficult for her to adjust to dialysis. She dislikes it very much. She will continue doing dialysis for now with the hope that she one day will no longer required the dialysis. She tells me she continues to have on and off thoughts about suicide. He had thoughts of suicide earlier  this morning she says that every time she has dialysis she starts thinking about suicide. She denies auditory or visual hallucinations. He tells me that she has been is sleeping better with the trazodone. She denies any other issues or concerns. Denies side effects.  1/28 patient says that she didn't have a good day yesterday. She became very lightheaded and dizzy after dialysis  yesterday. She couldn't tolerate the procedure and left a little earlier. She said that she tried to calm herself down and pray but her mind was racing and she couldn't concentrate. She feels very hopeless and helpless. She continues to voice depression and no desire to continue living in this conditions.  Per nursing: Darlinda spent most of shift in bed resting. She had minimal interaction with peers and staff. She reports still being depressed, she was medication compliant, will continue to monitor patient. No behavioral issues to report on shit at this time.   Principal Problem: MDD (major depressive disorder) Diagnosis:   Patient Active Problem List   Diagnosis Date Noted  . Cocaine use disorder, severe, dependence ([place]) [F14.20] 08/29/2016  . Hepatitis C [B19.20] 08/29/2016  . Diabetic ulcer of calf ([place]) [Z61.096, L97.209] 08/29/2016  . Diabetic ulcer of toe ([place]) [E45.409, W11.914] 08/29/2016  . Cerebrovascular accident (CVA) ([place]) [I63.9] 08/29/2016  . MDD (major depressive disorder) [F32.9] 08/28/2016  . Dyslipidemia associated with type 2 diabetes mellitus ([place]) [E11.69, E78.5] 11/10/2015  . History of vitamin D deficiency [Z86.39] 06/20/2015  . Long term prescription opiate use [Z79.891] 06/20/2015  . Chronic pain syndrome [G89.4] 06/19/2015  . Anemia of chronic kidney failure [N18.9, D63.1] 05/01/2015  . transmetatarsal amputation of the left foot [Z89.432] 12/13/2014  . Unilateral visual loss [H54.60] 11/22/2014  . 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 ([place]) [E11.9, Z79.4] 11/22/2014  . Chronic combined systolic and diastolic CHF (congestive heart failure) ([place]) [I50.42]   . Anemia, iron deficiency [D50.9] 04/13/2014  . Noncompliance [Z91.19] 02/02/2014  . CKD (chronic kidney disease) stage 5, GFR less than 15 ml/min (HCC) [N18.5] 05/18/2013  . Seizure disorder [facility]) [N82.956] 02/09/2013   Total Time spent with patient: 30 minutes  Past Psychiatric History: Pt says  she does not currently have any mental health providers. She cannot remember what psychiatric medications she was prescribed in the past. She reports she [facility] for suicide attempts.  Past Medical History:  Past Medical History:  Diagnosis Date  . Anemia   . Anxiety   . Bipolar 1 disorder ([place])   . Bipolar disorder, unspecified 02/06/2014  . Chronic combined systolic and diastolic CHF (congestive heart failure) (HCC)    EF 40% by echo and 48% by stress test  . Chronic pain syndrome   . Depression   . ESRD on hemodialysis ([place])   . GERD (gastroesophageal reflux disease)   . Glaucoma   . Headache    sinus headaches  . Hepatitis C   . High cholesterol   . History of hiatal hernia   . History of vitamin D deficiency 06/20/2015  . Hypertension   . MRSA infection ?2014   "on the back of my head; spread throughout my bloodstream"  . Neuropathy ([place])   . Refusal of blood transfusions as patient is Jehovah's Witness   . Schizoaffective disorder, unspecified condition 02/08/2014  . Seizure [facility]) dx'd 2014   "don't know what kind; last one was ~ 04/2014"  . Stroke [facility])  TIA 2009  . TIA (transient ischemic attack) 2010  . Type II diabetes mellitus ([place])    INSULIN DEPENDENT    Past Surgical History:  Procedure Laterality Date  . ABDOMINAL HYSTERECTOMY  2014  . ABDOMINAL HYSTERECTOMY  2010  . AMPUTATION Left 05/21/2013   Procedure: Left Midfoot AMPUTATION;  Surgeon: Newt Minion, MD;  Location: [place];  Service: Orthopedics;  Laterality: Left;  Left Midfoot Amputation  . AV FISTULA PLACEMENT Left 05/04/2015   Procedure: ARTERIOVENOUS (AV) GRAFT INSERTION;  Surgeon: Angelia Mould, MD;  Location: [place];  Service: Vascular;  Laterality: Left;  . COLONOSCOPY WITH PROPOFOL N/A 06/22/2013   Procedure: COLONOSCOPY WITH PROPOFOL;  Surgeon: Jeryl Columbia, MD;  Location: [facility] ENDOSCOPY;  Service: Endoscopy;   Laterality: N/A;  . ESOPHAGOGASTRODUODENOSCOPY N/A 05/11/2015   Procedure: ESOPHAGOGASTRODUODENOSCOPY (EGD);  Surgeon: Laurence Spates, MD;  Location: [facility] ENDOSCOPY;  Service: Endoscopy;  Laterality: N/A;  . EYE SURGERY Bilateral 2010   Lasik  . FOOT AMPUTATION Left    due diabetic neuropathy, could not feel ulcer on bottom of foot  . REFRACTIVE SURGERY Bilateral 2013  . TONSILLECTOMY  1970's  . TUBAL LIGATION    . TUBAL LIGATION  2000   Family History:  Family History  Problem Relation Age of Onset  . Hypertension Mother   . Hypertension Father    Family Psychiatric  History:  Social History:  History  Alcohol Use  . 1.8 oz/week  . 3 Standard drinks or equivalent per week    Comment: 05/01/15 patient denies alcohol use.     History  Drug Use  . Types: "Crack" cocaine, Cocaine    Comment: last smoked crack 08/21/16    Social History   Social History  . Marital status: Single    Spouse name: N/A  . Number of children: N/A  . Years of education: N/A   Social History Main Topics  . Smoking status: Former Smoker    Packs/day: 1.00    Years: 20.00    Types: Cigarettes    Quit date: 03/31/2015  . Smokeless tobacco: Never Used     Comment: "stopped smoking in ~ 2013; restarted last week; stopped again 06/17/2014"  . Alcohol use 1.8 oz/week    3 Standard drinks or equivalent per week     Comment: 05/01/15 patient denies alcohol use.  . Drug use: Yes    Types: "Crack" cocaine, Cocaine     Comment: last smoked crack 08/21/16  . Sexual activity: Not Currently    Birth control/ protection: Abstinence   Other Topics Concern  . None   Social History Narrative   ** Merged History Encounter **         Current Medications: Current Facility-Administered Medications  Medication Dose Route Frequency Provider Last Rate Last Dose  . acetaminophen (TYLENOL) tablet 1,000 mg  1,000 mg Oral Q6H PRN Hildred Priest, MD      . alum & mag hydroxide-simeth (MAALOX/MYLANTA)  200-200-20 MG/5ML suspension 30 mL  30 mL Oral Q4H PRN Hildred Priest, MD      . amLODipine (NORVASC) tablet 10 mg  10 mg Oral QHS Hildred Priest, MD   10 mg at 08/31/16 2129  . atorvastatin (LIPITOR) tablet 40 mg  40 mg Oral QHS Hildred Priest, MD   40 mg at 08/31/16 2129  . calcium acetate (PHOSLO) capsule 1,334 mg  1,334 mg Oral TID WC Hildred Priest, MD   1,334 mg at 09/01/16 1145  . carvedilol (COREG) tablet  25 mg  25 mg Oral BID WC Hildred Priest, MD   25 mg at 09/01/16 0746  . cloNIDine (CATAPRES) tablet 0.1 mg  0.1 mg Oral Q8H Hildred Priest, MD   0.1 mg at 09/01/16 0600  . doxercalciferol (HECTOROL) injection 1 mcg  1 mcg Intravenous Q T,Th,Sa-HD Hildred Priest, MD   1 mcg at 08/31/16 1057  . feeding supplement (PRO-STAT SUGAR FREE 64) liquid 30 mL  30 mL Oral BID Hildred Priest, MD   30 mL at 09/01/16 0800  . furosemide (LASIX) tablet 80 mg  80 mg Oral BID Murlean Iba, MD   80 mg at 09/01/16 0746  . haloperidol (HALDOL) tablet 0.5 mg  0.5 mg Oral TID Hildred Priest, MD   0.5 mg at 09/01/16 1145  . insulin aspart (novoLOG) injection 0-15 Units  0-15 Units Subcutaneous TID WC Hildred Priest, MD   3 Units at 09/01/16 1144  . insulin aspart (novoLOG) injection 0-5 Units  0-5 Units Subcutaneous QHS Hildred Priest, MD   3 Units at 08/28/16 2317  . insulin aspart (novoLOG) injection 5 Units  5 Units Subcutaneous TID WC Hildred Priest, MD   5 Units at 09/01/16 1145  . insulin glargine (LANTUS) injection 22 Units  22 Units Subcutaneous QHS Hildred Priest, MD   22 Units at 08/31/16 2200  . [START ON 09/03/2016] LORazepam (ATIVAN) injection 2 mg  2 mg Intravenous Q T,Th,Sa-HD Hildred Priest, MD      . multivitamin (RENA-VIT) tablet 1 tablet  1 tablet Oral QHS Hildred Priest, MD   1 tablet at 08/31/16 2129  . ondansetron (ZOFRAN) tablet 4  mg  4 mg Oral Q8H PRN Hildred Priest, MD      . oxyCODONE (Oxy IR/ROXICODONE) immediate release tablet 5 mg  5 mg Oral Q6H PRN Hildred Priest, MD   5 mg at 08/31/16 1329  . povidone-iodine (BETADINE) 10 % ointment   Topical Daily Hildred Priest, MD      . sertraline (ZOLOFT) tablet 50 mg  50 mg Oral Daily Hildred Priest, MD   50 mg at 08/31/16 1634  . traZODone (DESYREL) tablet 25 mg  25 mg Oral QHS Hildred Priest, MD   25 mg at 08/31/16 2129    Lab Results:  Results for orders placed or performed during the hospital encounter of 08/28/16 (from the past 48 hour(s))  Glucose, capillary     Status: Abnormal   Collection Time: 08/30/16  2:36 PM  Result Value Ref Range   Glucose-Capillary 112 (H) 65 - 99 mg/dL  Glucose, capillary     Status: Abnormal   Collection Time: 08/30/16  5:17 PM  Result Value Ref Range   Glucose-Capillary 187 (H) 65 - 99 mg/dL  Glucose, capillary     Status: Abnormal   Collection Time: 08/30/16  8:35 PM  Result Value Ref Range   Glucose-Capillary 194 (H) 65 - 99 mg/dL   Comment 1 Notify RN    Comment 2 Document in Chart   Glucose, capillary     Status: Abnormal   Collection Time: 08/31/16  6:40 AM  Result Value Ref Range   Glucose-Capillary 401 (H) 65 - 99 mg/dL   Comment 1 Notify RN    Comment 2 Document in Chart   TSH     Status: None   Collection Time: 08/31/16  6:50 AM  Result Value Ref Range   TSH 2.580 0.350 - 4.500 uIU/mL    Comment: Performed by a 3rd Generation assay with a functional  sensitivity of <=0.01 uIU/mL.  Glucose, capillary     Status: Abnormal   Collection Time: 08/31/16  7:45 AM  Result Value Ref Range   Glucose-Capillary 321 (H) 65 - 99 mg/dL  Glucose, capillary     Status: Abnormal   Collection Time: 08/31/16  8:46 AM  Result Value Ref Range   Glucose-Capillary 209 (H) 65 - 99 mg/dL  Glucose, capillary     Status: None   Collection Time: 08/31/16 12:02 PM  Result Value Ref  Range   Glucose-Capillary 81 65 - 99 mg/dL  Glucose, capillary     Status: Abnormal   Collection Time: 08/31/16  3:48 PM  Result Value Ref Range   Glucose-Capillary 116 (H) 65 - 99 mg/dL  Glucose, capillary     Status: Abnormal   Collection Time: 08/31/16  8:18 PM  Result Value Ref Range   Glucose-Capillary 200 (H) 65 - 99 mg/dL   Comment 1 Notify RN   Glucose, capillary     Status: Abnormal   Collection Time: 09/01/16  6:36 AM  Result Value Ref Range   Glucose-Capillary 274 (H) 65 - 99 mg/dL   Comment 1 Notify RN   Glucose, capillary     Status: Abnormal   Collection Time: 09/01/16 11:31 AM  Result Value Ref Range   Glucose-Capillary 174 (H) 65 - 99 mg/dL    Blood Alcohol level:  Lab Results  Component Value Date   ETH <5 08/26/2016   ETH <5 42/70/6237    Metabolic Disorder Labs: Lab Results  Component Value Date   HGBA1C 8.7 (H) 07/21/2016   MPG 203 07/21/2016   MPG 249 05/05/2015   No results found for: PROLACTIN Lab Results  Component Value Date   CHOL 124 12/13/2015   TRIG 54 12/13/2015   HDL 102 (A) 12/13/2015   CHOLHDL 3.1 05/10/2015   VLDL 12 05/10/2015   LDLCALC 11 12/13/2015   LDLCALC 36 05/10/2015    Physical Findings: AIMS:  , ,  ,  ,    CIWA:    COWS:     Musculoskeletal: Strength & Muscle Tone: decreased Gait & Station: unsteady Patient leans: N/A  Psychiatric Specialty Exam: Physical Exam  Constitutional: She is oriented to person, place, and time. She appears well-developed and well-nourished.  HENT:  Head: Atraumatic.  Eyes: EOM are normal.  Neck: Normal range of motion.  Respiratory: Effort normal.  Musculoskeletal: Normal range of motion.  Walks with walker  Neurological: She is alert and oriented to person, place, and time.    Review of Systems  Constitutional: Positive for malaise/fatigue.  HENT: Negative.   Eyes: Negative.   Respiratory: Negative.   Cardiovascular: Negative.   Gastrointestinal: Negative.    Genitourinary: Negative.   Musculoskeletal: Positive for joint pain.  Skin: Negative.   Neurological: Negative.   Endo/Heme/Allergies: Negative.   Psychiatric/Behavioral: Positive for depression, substance abuse and suicidal ideas.    Blood pressure 128/62, pulse 75, temperature 98.4 F (36.9 C), temperature source Oral, resp. rate 16, height 5\' 4"  (1.626 m), weight 67.9 kg (149 lb 11.1 oz), SpO2 100 %.Body mass index is 25.69 kg/m.  General Appearance: Fairly Groomed  Eye Contact:  Good  Speech:  Clear and Coherent  Volume:  Normal  Mood:  Dysphoric  Affect:  Appropriate and Congruent  Thought Process:  Linear and Descriptions of Associations: Intact  Orientation:  Full (Time, Place, and Person)  Thought Content:  Hallucinations: None  Suicidal Thoughts:  Yes.  without intent/plan  Homicidal Thoughts:  No  Memory:  Immediate;   Fair Recent;   Fair Remote;   Fair  Judgement:  Poor  Insight:  Shallow  Psychomotor Activity:  Decreased  Concentration:  Concentration: Fair and Attention Span: Fair  Recall:  [employer] of Knowledge:  Fair  Language:  Good  Akathisia:  No  Handed:    AIMS (if indicated):     Assets:  Agricultural consultant Housing  ADL's:  Intact  Cognition:  WNL  Sleep:  Number of Hours: 3.25     Treatment Plan Summary:  Patient is a 51 year old African-American female with a multitude of medical problems along with depression and substance abuse. Patient reports that her mood has worsened significantly since she was started on dialysis about a month ago. She complains of how having chronic pain and not receiving the appropriate treatment for it. She requests to be started back on Adderall which helped her in the past and OxyContin which was prescribed to her for pain in the past she feels that with the right medications she can move on with her life.  Patient has been in the emergency department a multitude of times. She has  history of substance abuse mainly cocaine use, and noncompliance with dialysis, insulin or antihypertensives. There is documentation of her having a MI transportation and also a stroke. Also has diabetic ulcers and already suffered an amputation of her left foot.  In 2017 the patient was placed in a assisted living facility after the surgery on her foot but left after a while. We have offered the patient assistance with placing her back in a assisted living facility where she can receive assistance but patient refuses. Says that she is planning on leaving to [place] in about a month to live with another sister.   Major depressive disorder the patient  has been started onsertraline 50 mg by mouth daily. The sertraline will be given after dialysis.  Anxiety: I will order haloperidol 0.5 mg 3 times a day to help with her complaints of agitation and anxiety. Patient reported that she had racing thoughts yesterday and was unable to even pray.  I will offer her Ativan 2 mg IV prior to dialysis in order to help reduce the tension and nervousness prior to each procedure  Insomnia: For insomnia continue trazodone 25 mg by mouth at bed time  Hypertension continue Norvasc 10 mg by mouth daily, continue Coreg 25 mg twice a day. Continue clonidine 0.1 mg every 8 hours.  Congestive heart failure continue Lasix 120 mg by mouth daily  Endstage renal fair on dialysis: Nephrology has been consulted/seen on 1/25. Last dialysis was given on January 25.  Due to fluid overload the patient also received dialysis today.  Hyperphosphatemia continue PhosLo 1334 mg 3 times a day   Diabetes: Patient has been started on a diabetic diet. Orders have been given for supplemental insulin. Continue Lantus at bedtime and NovoLog 3 times a day. Appears capillary blood glucose has been better control over the last 24 hours.   Diabetic ulcers: Patient has been found to have an ulcer in her toe and calf. Seen by wound care on  1/25. Recommended betadine daily to ulcers  Chronic pain: Patient is currently not follow-up by a primary care doctor and does not have pain management either. She is actively abusing substances. She does appear to have clear causes for chronic pain. She has an old fracture in her right elbow that has not being  repaired surgically because of her low hemoglobin and platelet count. She also had amputation of her left foot due to poorly controlled diabetes. Very likely she also suffers from diabetic neuropathy. For now we'll start her on roxicodone 5 mg every 6 hours when necessary. She told me she has a follow-up with a new primary care doctor at [facility] on February 14.  Dyslipidemia continue Lipitor 40 mg by mouth daily  Tobacco use disorder continue nicotine patch 14 mg a day  Unsteady gait patient to use a walker. She has been placed on fall precautions  Diet: Patient is on a renal diet with low carbohydrates. Seen by Dietician on 1/25  Vital signs are to be checked twice a day  Precautions every 15 minute checks. Patient is also on fall precautions  Disposition: will return to live with sister in [place].  Pt has offered assistance with placement in an ALF but refuses.   Follow-up: will need to be set up to f/u with psychiatrist in [place].  Will order home health. Has a f/u with [facility] in Feb for primary care.  Records from prior ER and outpt visits have been reviewed.  Spoke with nephrology, and dietician today.  Case discussed with nursing and SW.   Possible d/c in the next 3- 5 days.   Hildred Priest, MD 09/01/2016, 1:22 PM

## 2016-09-01 NOTE — Progress Notes (Signed)
Pt awake, alert, oriented today. Stays in her room except to come for meals/meds. Medication compliant. Continues to voice depression and racing thoughts, stating "i'm just ready to get my mind right and I'll be alright." Denies SI/HI/AVH. Pt does voice wanting to "do better." Pt is pleasant, calm and cooperative. Good appetite, poor sleep last night. Observed in bed most of the day.   Support and encouragement provided with use of therapeutic communication. Medications administered as ordered with education. Safety maintained with every 15 minute checks. Will continue to monitor.

## 2016-09-02 LAB — GLUCOSE, CAPILLARY
Glucose-Capillary: 260 mg/dL — ABNORMAL HIGH (ref 65–99)
Glucose-Capillary: 203 mg/dL — ABNORMAL HIGH (ref 65–99)
Glucose-Capillary: 77 mg/dL (ref 65–99)
Glucose-Capillary: 204 mg/dL — ABNORMAL HIGH (ref 65–99)

## 2016-09-02 MED ORDER — CALCIUM ACETATE (PHOS BINDER) 667 MG PO CAPS: 1334.0000 mg | ORAL_CAPSULE | Freq: Three times a day (TID) | ORAL | 0 refills | Status: DC

## 2016-09-02 MED ORDER — LORAZEPAM 2 MG/ML IJ SOLN: 2.0000 mg | INTRAMUSCULAR | 0 refills | Status: DC

## 2016-09-02 MED ORDER — AMLODIPINE BESYLATE 10 MG PO TABS: 10.0000 mg | ORAL_TABLET | Freq: Every day | ORAL | 0 refills | Status: DC

## 2016-09-02 MED ORDER — DOXERCALCIFEROL 4 MCG/2ML IV SOLN: 1.0000 ug | INTRAVENOUS | 0 refills | Status: DC

## 2016-09-02 MED ORDER — SERTRALINE HCL 50 MG PO TABS: 50.0000 mg | ORAL_TABLET | Freq: Every day | ORAL | 0 refills | Status: DC

## 2016-09-02 MED ORDER — TRAZODONE HCL 50 MG PO TABS: 25.0000 mg | ORAL_TABLET | Freq: Every day | ORAL | 0 refills | Status: DC

## 2016-09-02 MED ORDER — RENA-VITE PO TABS: 1.0000 | ORAL_TABLET | Freq: Every day | ORAL | 0 refills | Status: DC

## 2016-09-02 MED ORDER — POLYETHYLENE GLYCOL 3350 17 G PO PACK
17.0000 g | PACK | Freq: Two times a day (BID) | ORAL | Status: DC
  Administered 2016-09-02 – 2016-09-04 (×3): 17 g via ORAL
  Filled 2016-09-02 (×3): qty 1

## 2016-09-02 MED ORDER — CLONIDINE HCL 0.1 MG PO TABS: 0.1000 mg | ORAL_TABLET | Freq: Three times a day (TID) | ORAL | 11 refills | Status: DC

## 2016-09-02 MED ORDER — OXYCODONE HCL 5 MG PO TABS: 5.0000 mg | ORAL_TABLET | Freq: Three times a day (TID) | ORAL | 0 refills | Status: DC | PRN

## 2016-09-02 MED ORDER — CARVEDILOL 25 MG PO TABS: 25.0000 mg | ORAL_TABLET | Freq: Two times a day (BID) | ORAL | 0 refills | Status: DC

## 2016-09-02 MED ORDER — FUROSEMIDE 80 MG PO TABS: 80.0000 mg | ORAL_TABLET | Freq: Two times a day (BID) | ORAL | 0 refills | Status: DC

## 2016-09-02 MED ORDER — ATORVASTATIN CALCIUM 40 MG PO TABS: 40.0000 mg | ORAL_TABLET | Freq: Every day | ORAL | 0 refills | Status: DC

## 2016-09-02 NOTE — Plan of Care (Signed)
Problem: Self-Concept: Goal: Ability to verbalize positive feelings about self will improve Outcome: Progressing Pt does verbalize that she is "trying," but remains focused on poor quality of life related to dialysis.

## 2016-09-02 NOTE — [facility] Group Notes (Signed)
[place] Group Notes:  (Nursing/MHT/Case Management/Adjunct)  Date:  09/02/2016  Time:  9:30 PM  Type of Therapy:  Psychoeducational Skills  Participation Level:  Did Not Attend  Participation Quality:  Modes of Intervention:    Summary of Progress/Problems:  [name] 09/02/2016, 9:30 PM

## 2016-09-02 NOTE — Plan of Care (Signed)
Problem: Skin Integrity: Goal: Risk for impaired skin integrity will decrease Outcome: Progressing Instructed patient on protecting skin integrity as a diabetic to check feet daily for wounds. Patient verbalizes understanding Museum/gallery curator

## 2016-09-02 NOTE — Progress Notes (Signed)
[name] MD Progress Note  09/02/2016 12:15 PM [name]  MRN:  [id number] Subjective:  [name] a 51 y.o.femalewith history of CHF, ESRF, DM and cerebrovascular disease and substance abuse presented to [facility] ER on 1/21 with multiple complaints in the setting of missed dialysis. Patient has multiple visits to the emergency department. She has history of poor compliance with medications and with dialysis.   Her main concern initially was shortness of breath however later on she also voiced depression secondary to the severity of her chronic medical issues. Patient complained of hating her quality of life. She complains of crying spells isolation, anhedonia and fatigue, irritability poor concentration insomnia and poor appetite and feelings of hopelessness, helplessness and worthlessness. She reported picking whole in her skin. She had thoughts about shooting herself. She reports a history of at least three previous suicide attempts by overdose.  Pt reported she lives with her sister and seventeen-year-old nephew in a two story house. Pt says her legs are weak and she has difficulty sometimes climbing stairs. She says she uses a cane or walker sometimes when ambulating. She says she feels like a burden. She has three children but they have told her they cannot care for her. She cannot identify anyone in her life who is supportive.   Patient blames doctors at [name] for putting her on dialysis. She complaints that nobody cares about her problems.  Says she is suppose to be on on oxycodone and adderall and none of her doctors want to prescribe those to her. She says she is only using cocaine because she is in pain.   In addition to depression pt reports hearing voices at times but denied having hallucinations upon admission.  As far as substance abuse the patient has history of alcoholism but she denied any recent use. Her alcohol level was below the detection limit. Patient also has a  history of cocaine dependence. Urine toxicology was positive for cocaine. Patient stated that her last use was about 5 days ago.   1/26 patient had an episode of severe hypoglycemia last night around 4:30 AM. Her glucose was 33. Hypoglycemia resolved quickly. The recommendations have been received by the diabetes coordinator and Lantus will be decreased. Patient also appears to have fluid overload and even though she had dialysis yesterday she was to schedule again for dialysis today. Patient was seen this morning during dialysis. She reports sleeping well last night with the help of the trazodone. She feels that the medication causes her to feel sedated in the morning. Wonders if the medication can be given to her earlier. Patient says she doesn't know where she continues to have thoughts of suicide as she has been very sleepy and tired "I have not been awake enough". Patient is willing to continue zoloft and give it a try. He continues to request an increased dose of pain medication would like to have the opiates to schedule every 4 hours.  1/27 patient was seen this morning. She is a schedule for dialysis once again today. This is the 4rd time this week. Patient had fluid overload and had an extra dialysis on Friday. She tells me that he's been very difficult for her to adjust to dialysis. She dislikes it very much. She will continue doing dialysis for now with the hope that she one day will no longer required the dialysis. She tells me she continues to have on and off thoughts about suicide. He had thoughts of suicide earlier  this morning she says that every time she has dialysis she starts thinking about suicide. She denies auditory or visual hallucinations. He tells me that she has been is sleeping better with the trazodone. She denies any other issues or concerns. Denies side effects.  1/28 patient says that she didn't have a good day yesterday. She became very lightheaded and dizzy after dialysis  yesterday. She couldn't tolerate the procedure and left a little earlier. She said that she tried to calm herself down and pray but her mind was racing and she couldn't concentrate. She feels very hopeless and helpless. She continues to voice depression and no desire to continue living in this conditions.  1/29 patient reports feeling a little bit better after started on haloperidol. She says that after she took it she actually was able to concentrate and was able to pray. She didn't not feel that her mind was racing as much. She continues to feel depressed, and very discouraged as she needs to continue with dialysis. She says she is already dreading going to dialysis tomorrow. She agrees that receiving a sedative prior to each dialysis could be beneficial.   Patient continues to have passive suicidal ideation, no hallucinations. She has been is sleeping well. Has maintained appropriate oral intake. She has been compliant with medications. Her capillary blood glucose continues to be in the upper 200s. She acknowledged that even that she is aware that she cannot hit certain snack she has been eating graham crackers at night.  Per nursing: D: Patient is alert and oriented on the unit this shift. Patient attended and   participated in groups today. Patient denies suicidal ideation, homicidal ideation, auditory or visual hallucinations at the present time.  A: Scheduled medications are administered to patient as per MD orders. Emotional support and encouragement are provided. Patient is maintained on q.15 minute safety checks. Patient is informed to notify staff with questions or concerns. R: No adverse medication reactions are noted. Patient is cooperative with medication administration and treatment plan today. Patient is receptive, calm and cooperative on the unit at this time. Patient isolative  with others on the unit this shift. Patient contracts for safety at this time. Patient remains safe at this  time. Depression 8/10 Anxiety 5/10   Principal Problem: MDD (major depressive disorder) Diagnosis:   Patient Active Problem List   Diagnosis Date Noted  . Cocaine use disorder, severe, dependence ([place]) [F14.20] 08/29/2016  . Hepatitis C [B19.20] 08/29/2016  . Diabetic ulcer of calf ([place]) [I43.329, L97.209] 08/29/2016  . Diabetic ulcer of toe ([place]) [J18.841, Y60.630] 08/29/2016  . Cerebrovascular accident (CVA) ([place]) [I63.9] 08/29/2016  . MDD (major depressive disorder) [F32.9] 08/28/2016  . Dyslipidemia associated with type 2 diabetes mellitus ([place]) [E11.69, E78.5] 11/10/2015  . History of vitamin D deficiency [Z86.39] 06/20/2015  . Long term prescription opiate use [Z79.891] 06/20/2015  . Chronic pain syndrome [G89.4] 06/19/2015  . Anemia of chronic kidney failure [N18.9, D63.1] 05/01/2015  . transmetatarsal amputation of the left foot [Z89.432] 12/13/2014  . Unilateral visual loss [H54.60] 11/22/2014  . 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 ([place]) [E11.9, Z79.4] 11/22/2014  . Chronic combined systolic and diastolic CHF (congestive heart failure) ([place]) [I50.42]   . Anemia, iron deficiency [D50.9] 04/13/2014  . Noncompliance [Z91.19] 02/02/2014  . CKD (chronic kidney disease) stage 5, GFR less than 15 ml/min (HCC) [N18.5] 05/18/2013  . Seizure disorder ([place]) [Z60.109] 02/09/2013   Total Time spent with patient: 30 minutes  Past Psychiatric History: Pt  says she does not currently have any mental health providers. She cannot remember what psychiatric medications she was prescribed in the past. She reports she has [name] least three times for suicide attempts.  Past Medical History:  Past Medical History:  Diagnosis Date  . Anemia   . Anxiety   . Bipolar 1 disorder ([place])   . Bipolar disorder, unspecified 02/06/2014  . Chronic combined systolic and diastolic CHF (congestive heart failure) (HCC)    EF 40% by  echo and 48% by stress test  . Chronic pain syndrome   . Depression   . ESRD on hemodialysis ([place])   . GERD (gastroesophageal reflux disease)   . Glaucoma   . Headache    sinus headaches  . Hepatitis C   . High cholesterol   . History of hiatal hernia   . History of vitamin D deficiency 06/20/2015  . Hypertension   . MRSA infection ?2014   "on the back of my head; spread throughout my bloodstream"  . Neuropathy ([place])   . Refusal of blood transfusions as patient is Jehovah's Witness   . Schizoaffective disorder, unspecified condition 02/08/2014  . Seizure [facility]) dx'd 2014   "don't know what kind; last one was ~ 04/2014"  . Stroke [facility])    TIA 2009  . TIA (transient ischemic attack) 2010  . Type II diabetes mellitus ([place])    INSULIN DEPENDENT    Past Surgical History:  Procedure Laterality Date  . ABDOMINAL HYSTERECTOMY  2014  . ABDOMINAL HYSTERECTOMY  2010  . AMPUTATION Left 05/21/2013   Procedure: Left Midfoot AMPUTATION;  Surgeon: Newt Minion, MD;  Location: [place];  Service: Orthopedics;  Laterality: Left;  Left Midfoot Amputation  . AV FISTULA PLACEMENT Left 05/04/2015   Procedure: ARTERIOVENOUS (AV) GRAFT INSERTION;  Surgeon: Angelia Mould, MD;  Location: [place];  Service: Vascular;  Laterality: Left;  . COLONOSCOPY WITH PROPOFOL N/A 06/22/2013   Procedure: COLONOSCOPY WITH PROPOFOL;  Surgeon: Jeryl Columbia, MD;  Location: [facility] ENDOSCOPY;  Service: Endoscopy;  Laterality: N/A;  . ESOPHAGOGASTRODUODENOSCOPY N/A 05/11/2015   Procedure: ESOPHAGOGASTRODUODENOSCOPY (EGD);  Surgeon: Laurence Spates, MD;  Location: [name] ENDOSCOPY;  Service: Endoscopy;  Laterality: N/A;  . EYE SURGERY Bilateral 2010   Lasik  . FOOT AMPUTATION Left    due diabetic neuropathy, could not feel ulcer on bottom of foot  . REFRACTIVE SURGERY Bilateral 2013  . TONSILLECTOMY  1970's  . TUBAL LIGATION    . TUBAL LIGATION  2000   Family History:  Family History  Problem Relation Age of Onset  .  Hypertension Mother   . Hypertension Father    Family Psychiatric  History:  Social History:  History  Alcohol Use  . 1.8 oz/week  . 3 Standard drinks or equivalent per week    Comment: 05/01/15 patient denies alcohol use.     History  Drug Use  . Types: "Crack" cocaine, Cocaine    Comment: last smoked crack 08/21/16    Social History   Social History  . Marital status: Single    Spouse name: N/A  . Number of children: N/A  . Years of education: N/A   Social History Main Topics  . Smoking status: Former Smoker    Packs/day: 1.00    Years: 20.00    Types: Cigarettes    Quit date: 03/31/2015  . Smokeless tobacco: Never Used     Comment: "stopped smoking in ~ 2013;  restarted last week; stopped again 06/17/2014"  . Alcohol use 1.8 oz/week    3 Standard drinks or equivalent per week     Comment: 05/01/15 patient denies alcohol use.  . Drug use: Yes    Types: "Crack" cocaine, Cocaine     Comment: last smoked crack 08/21/16  . Sexual activity: Not Currently    Birth control/ protection: Abstinence   Other Topics Concern  . None   Social History Narrative   ** Merged History Encounter **         Current Medications: Current Facility-Administered Medications  Medication Dose Route Frequency Provider Last Rate Last Dose  . acetaminophen (TYLENOL) tablet 1,000 mg  1,000 mg Oral Q6H PRN Hildred Priest, MD      . alum & mag hydroxide-simeth (MAALOX/MYLANTA) 200-200-20 MG/5ML suspension 30 mL  30 mL Oral Q4H PRN Hildred Priest, MD      . amLODipine (NORVASC) tablet 10 mg  10 mg Oral QHS Hildred Priest, MD   10 mg at 09/01/16 2151  . atorvastatin (LIPITOR) tablet 40 mg  40 mg Oral QHS Hildred Priest, MD   40 mg at 09/01/16 2151  . calcium acetate (PHOSLO) capsule 1,334 mg  1,334 mg Oral TID WC Hildred Priest, MD   1,334 mg at 09/02/16 1137  . carvedilol (COREG) tablet 25 mg  25 mg Oral BID WC Hildred Priest, MD    25 mg at 09/02/16 0747  . cloNIDine (CATAPRES) tablet 0.1 mg  0.1 mg Oral Q8H Hildred Priest, MD   0.1 mg at 09/01/16 2152  . doxercalciferol (HECTOROL) injection 1 mcg  1 mcg Intravenous Q T,Th,Sa-HD Hildred Priest, MD   1 mcg at 08/31/16 1057  . feeding supplement (PRO-STAT SUGAR FREE 64) liquid 30 mL  30 mL Oral BID Hildred Priest, MD   30 mL at 09/02/16 0800  . furosemide (LASIX) tablet 80 mg  80 mg Oral BID Murlean Iba, MD   80 mg at 09/02/16 0747  . haloperidol (HALDOL) tablet 0.5 mg  0.5 mg Oral TID Hildred Priest, MD   0.5 mg at 09/02/16 1137  . insulin aspart (novoLOG) injection 0-15 Units  0-15 Units Subcutaneous TID WC Hildred Priest, MD   8 Units at 09/02/16 1138  . insulin aspart (novoLOG) injection 0-5 Units  0-5 Units Subcutaneous QHS Hildred Priest, MD   8 Units at 09/02/16 [phone]  . insulin aspart (novoLOG) injection 5 Units  5 Units Subcutaneous TID WC Hildred Priest, MD   5 Units at 09/02/16 1137  . insulin glargine (LANTUS) injection 22 Units  22 Units Subcutaneous QHS Hildred Priest, MD   22 Units at 09/01/16 2200  . [START ON 09/03/2016] LORazepam (ATIVAN) injection 2 mg  2 mg Intravenous Q T,Th,Sa-HD Hildred Priest, MD      . multivitamin (RENA-VIT) tablet 1 tablet  1 tablet Oral QHS Hildred Priest, MD   1 tablet at 09/01/16 2152  . ondansetron (ZOFRAN) tablet 4 mg  4 mg Oral Q8H PRN Hildred Priest, MD      . oxyCODONE (Oxy IR/ROXICODONE) immediate release tablet 5 mg  5 mg Oral Q6H PRN Hildred Priest, MD   5 mg at 09/01/16 2153  . povidone-iodine (BETADINE) 10 % ointment   Topical Daily Hildred Priest, MD      . sertraline (ZOLOFT) tablet 50 mg  50 mg Oral Daily Hildred Priest, MD   50 mg at 09/01/16 1637  . traZODone (DESYREL) tablet 25 mg  25 mg Oral QHS Hildred Priest,  MD   25 mg at 09/01/16 2154    Lab  Results:  Results for orders placed or performed during the [name] encounter of 08/28/16 (from the past 48 hour(s))  Glucose, capillary     Status: Abnormal   Collection Time: 08/31/16  3:48 PM  Result Value Ref Range   Glucose-Capillary 116 (H) 65 - 99 mg/dL  Glucose, capillary     Status: Abnormal   Collection Time: 08/31/16  8:18 PM  Result Value Ref Range   Glucose-Capillary 200 (H) 65 - 99 mg/dL   Comment 1 Notify RN   Glucose, capillary     Status: Abnormal   Collection Time: 09/01/16  6:36 AM  Result Value Ref Range   Glucose-Capillary 274 (H) 65 - 99 mg/dL   Comment 1 Notify RN   Glucose, capillary     Status: Abnormal   Collection Time: 09/01/16 11:31 AM  Result Value Ref Range   Glucose-Capillary 174 (H) 65 - 99 mg/dL  Glucose, capillary     Status: None   Collection Time: 09/01/16  4:35 PM  Result Value Ref Range   Glucose-Capillary 97 65 - 99 mg/dL  Glucose, capillary     Status: Abnormal   Collection Time: 09/01/16  9:18 PM  Result Value Ref Range   Glucose-Capillary 113 (H) 65 - 99 mg/dL   Comment 1 Notify RN   Glucose, capillary     Status: Abnormal   Collection Time: 09/02/16  6:44 AM  Result Value Ref Range   Glucose-Capillary 260 (H) 65 - 99 mg/dL  Glucose, capillary     Status: Abnormal   Collection Time: 09/02/16 11:35 AM  Result Value Ref Range   Glucose-Capillary 203 (H) 65 - 99 mg/dL    Blood Alcohol level:  Lab Results  Component Value Date   ETH <5 08/26/2016   ETH <5 14/43/1540    Metabolic Disorder Labs: Lab Results  Component Value Date   HGBA1C 8.7 (H) 07/21/2016   MPG 203 07/21/2016   MPG 249 05/05/2015   No results found for: PROLACTIN Lab Results  Component Value Date   CHOL 124 12/13/2015   TRIG 54 12/13/2015   HDL 102 (A) 12/13/2015   CHOLHDL 3.1 05/10/2015   VLDL 12 05/10/2015   LDLCALC 11 12/13/2015   LDLCALC 36 05/10/2015    Physical Findings: AIMS:  , ,  ,  ,    CIWA:    COWS:     Musculoskeletal: Strength  & Muscle Tone: decreased Gait & Station: unsteady Patient leans: N/A  Psychiatric Specialty Exam: Physical Exam  Constitutional: She is oriented to person, place, and time. She appears well-developed and well-nourished.  HENT:  Head: Atraumatic.  Eyes: EOM are normal.  Neck: Normal range of motion.  Respiratory: Effort normal.  Musculoskeletal: Normal range of motion.  Walks with walker  Neurological: She is alert and oriented to person, place, and time.    Review of Systems  Constitutional: Positive for malaise/fatigue.  HENT: Negative.   Eyes: Negative.   Respiratory: Negative.   Cardiovascular: Negative.   Gastrointestinal: Negative.   Genitourinary: Negative.   Musculoskeletal: Positive for joint pain.  Skin: Negative.   Neurological: Negative.   Endo/Heme/Allergies: Negative.   Psychiatric/Behavioral: Positive for depression, substance abuse and suicidal ideas.    Blood pressure 131/64, pulse 71, temperature 98.1 F (36.7 C), temperature source Oral, resp. rate 18, height 5\' 4"  (1.626 m), weight 67.9 kg (149 lb 11.1 oz), SpO2 100 %.Body mass index is  25.69 kg/m.  General Appearance: Fairly Groomed  Eye Contact:  Good  Speech:  Clear and Coherent  Volume:  Normal  Mood:  Dysphoric  Affect:  Appropriate and Congruent  Thought Process:  Linear and Descriptions of Associations: Intact  Orientation:  Full (Time, Place, and Person)  Thought Content:  Hallucinations: None  Suicidal Thoughts:  Yes.  without intent/plan  Homicidal Thoughts:  No  Memory:  Immediate;   Fair Recent;   Fair Remote;   Fair  Judgement:  Poor  Insight:  Shallow  Psychomotor Activity:  Decreased  Concentration:  Concentration: Fair and Attention Span: Fair  Recall:  [employer] of Knowledge:  Fair  Language:  Good  Akathisia:  No  Handed:    AIMS (if indicated):     Assets:  Agricultural consultant Housing  ADL's:  Intact  Cognition:  WNL  Sleep:  Number of  Hours: 6.15     Treatment Plan Summary:  Patient is a 51 year old African-American female with a multitude of medical problems along with depression and substance abuse. Patient reports that her mood has worsened significantly since she was started on dialysis about a month ago. She complains of how having chronic pain and not receiving the appropriate treatment for it. She requests to be started back on Adderall which helped her in the past and OxyContin which was prescribed to her for pain in the past she feels that with the right medications she can move on with her life.  Patient has been in the emergency department a multitude of times. She has history of substance abuse mainly cocaine use, and noncompliance with dialysis, insulin or antihypertensives. There is documentation of her having a MI transportation and also a stroke. Also has diabetic ulcers and already suffered an amputation of her left foot.  In 2017 the patient was placed in a assisted living facility after the surgery on her foot but left after a while. We have offered the patient assistance with placing her back in a assisted living facility where she can receive assistance but patient refuses. Says that she is planning on leaving to [place] in about a month to live with another sister.   Major depressive disorder the patient  has been started on sertraline 50 mg by mouth daily. The sertraline will be given after dialysis.  Anxiety: continue haloperidol 0.5 mg 3 times a day to help with her complaints of agitation and anxiety.  Found it beneficial.  Ordered Ativan 2 mg IV prior to dialysis in order to help reduce the tension and nervousness prior to each procedure  Insomnia: For insomnia continue trazodone 25 mg by mouth at bed time  Hypertension continue Norvasc 10 mg by mouth daily, continue Coreg 25 mg twice a day. Continue clonidine 0.1 mg every 8 hours.  Congestive heart failure continue Lasix 120 mg by mouth  daily  Endstage renal fair on dialysis: Nephrology has been consulted/seen on 1/25. Last dialysis was given on January 27.    Hyperphosphatemia continue PhosLo 1334 mg 3 times a day   Diabetes: Patient has been started on a diabetic diet. Orders have been given for supplemental insulin. Continue Lantus at bedtime and NovoLog 3 times a day. Appears capillary blood glucose has been better control over the last few days. Patient however is frequently noncompliant with dietary restrictions. She has been eating snacks other than the ones order to her which are appropriate for diabetes. Patient states she was eating graham crackers last night.  Diabetic ulcers: Patient has been found to have an ulcer in her toe and calf. Seen by wound care on 1/25. Recommended betadine daily to ulcers  Chronic pain: Patient is currently not follow-up by a primary care doctor and does not have pain management either. She is actively abusing substances. She does appear to have clear causes for chronic pain. She has an old fracture in her right elbow that has not being repaired surgically because of her low hemoglobin and platelet count. She also had amputation of her left foot due to poorly controlled diabetes. Very likely she also suffers from diabetic neuropathy. For now she has been started  on roxicodone 5 mg every 6 hours when necessary. She told me she has a follow-up with a new primary care doctor at [facility] on February 14.  Dyslipidemia continue Lipitor 40 mg by mouth daily  Tobacco use disorder continue nicotine patch 14 mg a day  Unsteady gait patient to use a walker. She has been placed on fall precautions  Diet: Patient is on a renal diet with low carbohydrates. Seen by Dietician on 1/25  Vital signs are to be checked twice a day  Precautions every 15 minute checks. Patient is also on fall precautions  Disposition: will return to live with sister in [place].  Pt has offered assistance  with placement in an ALF but refuses.   Follow-up: will need to be set up to f/u with psychiatrist in [place].  Will order home health. Has a f/u with [facility] in Feb for primary care.  Records from prior ER and outpt visits have been reviewed.  Spoke with nephrology, and dietician today.  Case discussed with nursing and SW.   Possible d/c in the next 3 days.   Hildred Priest, MD 09/02/2016, 12:15 PM

## 2016-09-02 NOTE — [facility] Group Notes (Signed)
[facility] Group Notes:  (Nursing/MHT/Case Management/Adjunct)  Date:  09/02/2016  Time:  3:42 PM  Type of Therapy:  Psychoeducational Skills  Participation Level:  Did Not Attend  Adela Lank [facility] 09/02/2016, 3:42 PM

## 2016-09-02 NOTE — [facility] Group Notes (Signed)
[facility] Group Notes:  (Nursing/MHT/Case Management/Adjunct)  Date:  09/02/2016  Time:  3:59 AM  Type of Therapy:  Group Therapy  Participation Level:  Active  Participation Quality:  Appropriate  Affect:  Appropriate  Cognitive:  Appropriate  Insight:  Appropriate  Engagement in Group:  Engaged  Modes of Intervention:  n/a  Summary of Progress/Problems:  [name] 09/02/2016, 3:59 AM

## 2016-09-02 NOTE — Progress Notes (Signed)
D: Patient is alert and oriented on the unit this shift. Patient attended and   participated in groups today. Patient denies suicidal ideation, homicidal ideation, auditory or visual hallucinations at the present time.  A: Scheduled medications are administered to patient as per MD orders. Emotional support and encouragement are provided. Patient is maintained on q.15 minute safety checks. Patient is informed to notify staff with questions or concerns. R: No adverse medication reactions are noted. Patient is cooperative with medication administration and treatment plan today. Patient is receptive, calm and cooperative on the unit at this time. Patient isolative  with others on the unit this shift. Patient contracts for safety at this time. Patient remains safe at this time. Depression 8/10 Anxiety 5/10

## 2016-09-02 NOTE — Progress Notes (Signed)
Recreation Therapy Notes  Date: 01.29.18 Time: 9:30 am Location: Craft Room  Group Topic: Self-expression  Goal Area(s) Addresses:  Patient will effectively use art as a means of self-expression. Patient will recognize positive benefit of self-expression. Patient will be able to identify one emotion experienced during group session. Patient will identify use of art as a coping skill.  Behavioral Response: Did not attend  Intervention: Two Faces of Me  Activity: Patients were given a blank face worksheet and were instructed to draw a line down the middle. On one side of the worksheet, patients were instructed to draw or write how they felt when they were admitted. On the other side, patients were instructed to draw or write how they want to feel when they are d/c.   Education: LRT educated patients on different forms of self-expression.   Education Outcome: Patient did not attend group.  Clinical Observations/Feedback: Patient did not attend group.  Leonette Monarch, LRT/CTRS 09/02/2016 9:59 AM

## 2016-09-02 NOTE — Progress Notes (Signed)
Per Kathrene Bongo, RN from night shift with witness Freddi Che, Rn, nurse administered 8 units novolog insulin subcutaneously as ordered for sliding scale due to pt's blood glucose being 260 this morning prior to shift change. Was not documented in [facility].

## 2016-09-02 NOTE — Progress Notes (Signed)
Chaplain received an order that Pt. Requested to meet with Chaplain because she experienced a major transition in her life. She requested a prayer, but when Chaplain arrived she changed her mind. She did not want to meet with Chaplain anymore.

## 2016-09-02 NOTE — Plan of Care (Signed)
Problem: Safety: Goal: Ability to remain free from injury will improve Outcome: Progressing Patient has been able to remain free of injury CTownsendRN

## 2016-09-02 NOTE — Progress Notes (Signed)
Pt awake, alert, oriented and up on unit today. Complains of feeling light headed and dizzy. VS stable. Blood sugars stable. Pt reports that she feels her now stable blood pressure "is too low for me." Pt is medication compliant. Regarding thoughts today, pt reports "I feel worse unless I'm asleep. I was able to pray longer last night before I went to bed though." Pt does appear to be improving, brightens with interactions, very pleasant. She does voice, however that she continues to be depressed, sad about dialysis and her quality of life. Denies SI/HI/AVH. Spiritual care consult acknowledged, chaplain did come due to request of the pt, but pt refused to meet with him. Encouragement was provided, but she continued to refuse.  Support and encouragement provided with use of therapeutic communication. Medications administered as ordered with education. Safety maintained with every 15 minute checks. Will continue to monitor.

## 2016-09-03 LAB — GLUCOSE, CAPILLARY
GLUCOSE-CAPILLARY: 135 mg/dL — AB (ref 65–99)
GLUCOSE-CAPILLARY: 251 mg/dL — AB (ref 65–99)
Glucose-Capillary: 210 mg/dL — ABNORMAL HIGH (ref 65–99)
Glucose-Capillary: 112 mg/dL — ABNORMAL HIGH (ref 65–99)

## 2016-09-03 LAB — PHOSPHORUS: Phosphorus: 6.2 mg/dL — ABNORMAL HIGH (ref 2.5–4.6)

## 2016-09-03 MED ORDER — INSULIN GLARGINE 100 UNIT/ML ~~LOC~~ SOLN: 22.0000 [IU] | Freq: Every day | SUBCUTANEOUS | 0 refills | Status: DC

## 2016-09-03 MED ORDER — HALOPERIDOL 0.5 MG PO TABS
0.5000 mg | ORAL_TABLET | Freq: Three times a day (TID) | ORAL | Status: DC | PRN
  Administered 2016-09-04: 0.5 mg via ORAL
  Filled 2016-09-03: qty 1

## 2016-09-03 MED ORDER — INSULIN ASPART 100 UNIT/ML ~~LOC~~ SOLN: 5.0000 [IU] | Freq: Three times a day (TID) | SUBCUTANEOUS | 0 refills | Status: DC

## 2016-09-03 MED ORDER — POVIDONE-IODINE 10 % OINT PACKET: 1.0000 "application " | TOPICAL_OINTMENT | Freq: Four times a day (QID) | CUTANEOUS | 0 refills | Status: AC | PRN

## 2016-09-03 MED ORDER — HALOPERIDOL 0.5 MG PO TABS: 0.5000 mg | ORAL_TABLET | Freq: Three times a day (TID) | ORAL | 0 refills | Status: DC | PRN

## 2016-09-03 MED ORDER — TUBERCULIN PPD 5 UNIT/0.1ML ID SOLN
5.0000 [IU] | Freq: Once | INTRADERMAL | Status: DC
  Administered 2016-09-03: 5 [IU] via INTRADERMAL
  Filled 2016-09-03: qty 0.1

## 2016-09-03 NOTE — Progress Notes (Signed)
Pre-hd tx 

## 2016-09-03 NOTE — Progress Notes (Signed)
[facility] MD Progress Note  09/03/2016 2:51 PM [name]  MRN:  [id number] Subjective:  [name] a 51 y.o.femalewith history of CHF, ESRF, DM and cerebrovascular disease and substance abuse presented to [facility] ER on 1/21 with multiple complaints in the setting of missed dialysis. Patient has multiple visits to the emergency department. She has history of poor compliance with medications and with dialysis.   Her main concern initially was shortness of breath however later on she also voiced depression secondary to the severity of her chronic medical issues. Patient complained of hating her quality of life. She complains of crying spells isolation, anhedonia and fatigue, irritability poor concentration insomnia and poor appetite and feelings of hopelessness, helplessness and worthlessness. She reported picking whole in her skin. She had thoughts about shooting herself. She reports a history of at least three previous suicide attempts by overdose.  Pt reported she lives with her sister and seventeen-year-old nephew in a two story house. Pt says her legs are weak and she has difficulty sometimes climbing stairs. She says she uses a cane or walker sometimes when ambulating. She says she feels like a burden. She has three children but they have told her they cannot care for her. She cannot identify anyone in her life who is supportive.   Patient blames doctors at [facility] for putting her on dialysis. She complaints that nobody cares about her problems.  Says she is suppose to be on on oxycodone and adderall and none of her doctors want to prescribe those to her. She says she is only using cocaine because she is in pain.   In addition to depression pt reports hearing voices at times but denied having hallucinations upon admission.  As far as substance abuse the patient has history of alcoholism but she denied any recent use. Her alcohol level was below the detection limit. Patient also has a  history of cocaine dependence. Urine toxicology was positive for cocaine. Patient stated that her last use was about 5 days ago.   1/26 patient had an episode of severe hypoglycemia last night around 4:30 AM. Her glucose was 33. Hypoglycemia resolved quickly. The recommendations have been received by the diabetes coordinator and Lantus will be decreased. Patient also appears to have fluid overload and even though she had dialysis yesterday she was to schedule again for dialysis today. Patient was seen this morning during dialysis. She reports sleeping well last night with the help of the trazodone. She feels that the medication causes her to feel sedated in the morning. Wonders if the medication can be given to her earlier. Patient says she doesn't know where she continues to have thoughts of suicide as she has been very sleepy and tired "I have not been awake enough". Patient is willing to continue zoloft and give it a try. He continues to request an increased dose of pain medication would like to have the opiates to schedule every 4 hours.  1/27 patient was seen this morning. She is a schedule for dialysis once again today. This is the 4rd time this week. Patient had fluid overload and had an extra dialysis on Friday. She tells me that he's been very difficult for her to adjust to dialysis. She dislikes it very much. She will continue doing dialysis for now with the hope that she one day will no longer required the dialysis. She tells me she continues to have on and off thoughts about suicide. He had thoughts of suicide earlier  this morning she says that every time she has dialysis she starts thinking about suicide. She denies auditory or visual hallucinations. He tells me that she has been is sleeping better with the trazodone. She denies any other issues or concerns. Denies side effects.  1/28 patient says that she didn't have a good day yesterday. She became very lightheaded and dizzy after dialysis  yesterday. She couldn't tolerate the procedure and left a little earlier. She said that she tried to calm herself down and pray but her mind was racing and she couldn't concentrate. She feels very hopeless and helpless. She continues to voice depression and no desire to continue living in this conditions.  1/29 patient reports feeling a little bit better after started on haloperidol. She says that after she took it she actually was able to concentrate and was able to pray. She didn't not feel that her mind was racing as much. She continues to feel depressed, and very discouraged as she needs to continue with dialysis. She says she is already dreading going to dialysis tomorrow. She agrees that receiving a sedative prior to each dialysis could be beneficial.   Patient continues to have passive suicidal ideation, no hallucinations. She has been is sleeping well. Has maintained appropriate oral intake. She has been compliant with medications. Her capillary blood glucose continues to be in the upper 200s. She acknowledged that even that she is aware that she cannot hit certain snack she has been eating graham crackers at night.  1/30 Doing better as far as mood, appetite, energy and concentration.  Denies major issues with sleep. Tolerating meds well, w/o SE.  Denies SI/HI or hallucinations.  Today she requested to be d/c to ALF (which she had refused multiple times before). Will discuss with SW.  ALF is best option for her due to her h/o non compliance with meds and dialysis. Also less likely to abuse substances in a controlled environment.  Per nursing: A&Ox3, VSS, denied pain, CBG monitored, mood and affect sad, flat, not optimistic; she is worried about disposition and HD after discharge; denied SI/HI, denied AV/H. Overall appearance: neat.  Pt does verbalize that she is "trying," but remains focused on poor quality of life related to dialysis.   Principal Problem: MDD (major depressive  disorder) Diagnosis:   Patient Active Problem List   Diagnosis Date Noted  . Cocaine use disorder, severe, dependence ([place]) [F14.20] 08/29/2016  . Hepatitis C [B19.20] 08/29/2016  . Diabetic ulcer of calf ([place]) [S93.734, L97.209] 08/29/2016  . Diabetic ulcer of toe ([place]) [K87.681, L57.262] 08/29/2016  . Cerebrovascular accident (CVA) ([place]) [I63.9] 08/29/2016  . MDD (major depressive disorder) [F32.9] 08/28/2016  . Dyslipidemia associated with type 2 diabetes mellitus ([place]) [E11.69, E78.5] 11/10/2015  . History of vitamin D deficiency [Z86.39] 06/20/2015  . Long term prescription opiate use [Z79.891] 06/20/2015  . Chronic pain syndrome [G89.4] 06/19/2015  . Anemia of chronic kidney failure [N18.9, D63.1] 05/01/2015  . transmetatarsal amputation of the left foot [Z89.432] 12/13/2014  . Unilateral visual loss [H54.60] 11/22/2014  . 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 ([place]) [E11.9, Z79.4] 11/22/2014  . Chronic combined systolic and diastolic CHF (congestive heart failure) ([place]) [I50.42]   . Anemia, iron deficiency [D50.9] 04/13/2014  . Noncompliance [Z91.19] 02/02/2014  . CKD (chronic kidney disease) stage 5, GFR less than 15 ml/min (HCC) [N18.5] 05/18/2013  . Seizure disorder ([place]) [M35.597] 02/09/2013   Total Time spent with patient: 30 minutes  Past Psychiatric History: Pt says she does  not currently have any mental health providers. She cannot remember what psychiatric medications she was prescribed in the past. She reports [facility] for [facility].  Past Medical History:  Past Medical History:  Diagnosis Date  . Anemia   . Anxiety   . Bipolar 1 disorder ([place])   . Bipolar disorder, unspecified 02/06/2014  . Chronic combined systolic and diastolic CHF (congestive heart failure) (HCC)    EF 40% by echo and 48% by stress test  . Chronic pain syndrome   . Depression   . ESRD on  hemodialysis ([place])   . GERD (gastroesophageal reflux disease)   . Glaucoma   . Headache    sinus headaches  . Hepatitis C   . High cholesterol   . History of hiatal hernia   . History of vitamin D deficiency 06/20/2015  . Hypertension   . MRSA infection ?2014   "on the back of my head; spread throughout my bloodstream"  . Neuropathy ([place])   . Refusal of blood transfusions as patient is Jehovah's Witness   . Schizoaffective disorder, unspecified condition 02/08/2014  . Seizure [facility]) dx'd 2014   "don't know what kind; last one was ~ 04/2014"  . Stroke [facility])    TIA 2009  . TIA (transient ischemic attack) 2010  . Type II diabetes mellitus ([place])    INSULIN DEPENDENT    Past Surgical History:  Procedure Laterality Date  . ABDOMINAL HYSTERECTOMY  2014  . ABDOMINAL HYSTERECTOMY  2010  . AMPUTATION Left 05/21/2013   Procedure: Left Midfoot AMPUTATION;  Surgeon: Newt Minion, MD;  Location: [facility];  Service: Orthopedics;  Laterality: Left;  Left Midfoot Amputation  . AV FISTULA PLACEMENT Left 05/04/2015   Procedure: ARTERIOVENOUS (AV) GRAFT INSERTION;  Surgeon: Angelia Mould, MD;  Location: [facility];  Service: Vascular;  Laterality: Left;  . COLONOSCOPY WITH PROPOFOL N/A 06/22/2013   Procedure: COLONOSCOPY WITH PROPOFOL;  Surgeon: Jeryl Columbia, MD;  Location: [facility];  Service: Endoscopy;  Laterality: N/A;  . ESOPHAGOGASTRODUODENOSCOPY N/A 05/11/2015   Procedure: ESOPHAGOGASTRODUODENOSCOPY (EGD);  Surgeon: Laurence Spates, MD;  Location: [facility] ENDOSCOPY;  Service: Endoscopy;  Laterality: N/A;  . EYE SURGERY Bilateral 2010   Lasik  . FOOT AMPUTATION Left    due diabetic neuropathy, could not feel ulcer on bottom of foot  . REFRACTIVE SURGERY Bilateral 2013  . TONSILLECTOMY  1970's  . TUBAL LIGATION    . TUBAL LIGATION  2000   Family History:  Family History  Problem Relation Age of Onset  . Hypertension Mother   . Hypertension Father    Family Psychiatric  History:  Social  History:  History  Alcohol Use  . 1.8 oz/week  . 3 Standard drinks or equivalent per week    Comment: 05/01/15 patient denies alcohol use.     History  Drug Use  . Types: "Crack" cocaine, Cocaine    Comment: last smoked crack 08/21/16    Social History   Social History  . Marital status: Single    Spouse name: N/A  . Number of children: N/A  . Years of education: N/A   Social History Main Topics  . Smoking status: Former Smoker    Packs/day: 1.00    Years: 20.00    Types: Cigarettes    Quit date: 03/31/2015  . Smokeless tobacco: Never Used     Comment: "stopped smoking in ~ 2013; restarted last week;  stopped again 06/17/2014"  . Alcohol use 1.8 oz/week    3 Standard drinks or equivalent per week     Comment: 05/01/15 patient denies alcohol use.  . Drug use: Yes    Types: "Crack" cocaine, Cocaine     Comment: last smoked crack 08/21/16  . Sexual activity: Not Currently    Birth control/ protection: Abstinence   Other Topics Concern  . None   Social History Narrative   ** Merged History Encounter **         Current Medications: Current Facility-Administered Medications  Medication Dose Route Frequency Provider Last Rate Last Dose  . acetaminophen (TYLENOL) tablet 1,000 mg  1,000 mg Oral Q6H PRN Hildred Priest, MD      . alum & mag hydroxide-simeth (MAALOX/MYLANTA) 200-200-20 MG/5ML suspension 30 mL  30 mL Oral Q4H PRN Hildred Priest, MD      . amLODipine (NORVASC) tablet 10 mg  10 mg Oral QHS Hildred Priest, MD   10 mg at 09/02/16 2119  . atorvastatin (LIPITOR) tablet 40 mg  40 mg Oral QHS Hildred Priest, MD   40 mg at 09/02/16 2120  . calcium acetate (PHOSLO) capsule 1,334 mg  1,334 mg Oral TID WC Hildred Priest, MD   Stopped at 09/03/16 1200  . carvedilol (COREG) tablet 25 mg  25 mg Oral BID WC Hildred Priest, MD   Stopped at 09/03/16 0910  . cloNIDine (CATAPRES) tablet 0.1 mg  0.1 mg Oral Q8H Hildred Priest, MD   Stopped at 09/03/16 1497  . doxercalciferol (HECTOROL) injection 1 mcg  1 mcg Intravenous Q T,Th,Sa-HD Hildred Priest, MD   1 mcg at 09/03/16 1051  . feeding supplement (PRO-STAT SUGAR FREE 64) liquid 30 mL  30 mL Oral BID Hildred Priest, MD   Stopped at 09/03/16 0910  . furosemide (LASIX) tablet 80 mg  80 mg Oral BID Murlean Iba, MD   Stopped at 09/03/16 0910  . haloperidol (HALDOL) tablet 0.5 mg  0.5 mg Oral TID Hildred Priest, MD   Stopped at 09/03/16 [phone]  . insulin aspart (novoLOG) injection 0-15 Units  0-15 Units Subcutaneous TID WC Hildred Priest, MD   Stopped at 09/03/16 1200  . insulin aspart (novoLOG) injection 0-5 Units  0-5 Units Subcutaneous QHS Hildred Priest, MD   8 Units at 09/02/16 [phone]  . insulin aspart (novoLOG) injection 5 Units  5 Units Subcutaneous TID WC Hildred Priest, MD   Stopped at 09/03/16 1201  . insulin glargine (LANTUS) injection 22 Units  22 Units Subcutaneous QHS Hildred Priest, MD   22 Units at 09/02/16 2145  . LORazepam (ATIVAN) injection 2 mg  2 mg Intravenous Q T,Th,Sa-HD Hildred Priest, MD   2 mg at 09/03/16 1101  . multivitamin (RENA-VIT) tablet 1 tablet  1 tablet Oral QHS Hildred Priest, MD   1 tablet at 09/02/16 2120  . ondansetron (ZOFRAN) tablet 4 mg  4 mg Oral Q8H PRN Hildred Priest, MD      . oxyCODONE (Oxy IR/ROXICODONE) immediate release tablet 5 mg  5 mg Oral Q6H PRN Hildred Priest, MD   5 mg at 09/01/16 2153  . polyethylene glycol (MIRALAX / GLYCOLAX) packet 17 g  17 g Oral BID Hildred Priest, MD   Stopped at 09/03/16 8502  . povidone-iodine (BETADINE) 10 % ointment   Topical Daily Hildred Priest, MD      . sertraline (ZOLOFT) tablet 50 mg  50 mg Oral Daily Hildred Priest, MD   50 mg at 09/02/16 1622  .  traZODone (DESYREL) tablet 25 mg  25 mg Oral QHS Hildred Priest, MD   25 mg at 09/02/16 2120  . tuberculin injection 5 Units  5 Units Intradermal Once Hildred Priest, MD        Lab Results:  Results for orders placed or performed during the hospital encounter of 08/28/16 (from the past 48 hour(s))  Glucose, capillary     Status: None   Collection Time: 09/01/16  4:35 PM  Result Value Ref Range   Glucose-Capillary 97 65 - 99 mg/dL  Glucose, capillary     Status: Abnormal   Collection Time: 09/01/16  9:18 PM  Result Value Ref Range   Glucose-Capillary 113 (H) 65 - 99 mg/dL   Comment 1 Notify RN   Glucose, capillary     Status: Abnormal   Collection Time: 09/02/16  6:44 AM  Result Value Ref Range   Glucose-Capillary 260 (H) 65 - 99 mg/dL  Glucose, capillary     Status: Abnormal   Collection Time: 09/02/16 11:35 AM  Result Value Ref Range   Glucose-Capillary 203 (H) 65 - 99 mg/dL  Glucose, capillary     Status: None   Collection Time: 09/02/16  4:20 PM  Result Value Ref Range   Glucose-Capillary 77 65 - 99 mg/dL  Glucose, capillary     Status: Abnormal   Collection Time: 09/02/16  8:45 PM  Result Value Ref Range   Glucose-Capillary 204 (H) 65 - 99 mg/dL  Glucose, capillary     Status: Abnormal   Collection Time: 09/03/16  6:57 AM  Result Value Ref Range   Glucose-Capillary 210 (H) 65 - 99 mg/dL  Phosphorus     Status: Abnormal   Collection Time: 09/03/16 10:16 AM  Result Value Ref Range   Phosphorus 6.2 (H) 2.5 - 4.6 mg/dL  Glucose, capillary     Status: Abnormal   Collection Time: 09/03/16  2:21 PM  Result Value Ref Range   Glucose-Capillary 112 (H) 65 - 99 mg/dL    Blood Alcohol level:  Lab Results  Component Value Date   ETH <5 08/26/2016   ETH <5 63/08/6008    Metabolic Disorder Labs: Lab Results  Component Value Date   HGBA1C 8.7 (H) 07/21/2016   MPG 203 07/21/2016   MPG 249 05/05/2015   No results found for: PROLACTIN Lab Results  Component Value Date   CHOL 124 12/13/2015   TRIG 54  12/13/2015   HDL 102 (A) 12/13/2015   CHOLHDL 3.1 05/10/2015   VLDL 12 05/10/2015   LDLCALC 11 12/13/2015   LDLCALC 36 05/10/2015    Physical Findings: AIMS:  , ,  ,  ,    CIWA:    COWS:     Musculoskeletal: Strength & Muscle Tone: decreased Gait & Station: unsteady Patient leans: N/A  Psychiatric Specialty Exam: Physical Exam  Constitutional: She is oriented to person, place, and time. She appears well-developed and well-nourished.  HENT:  Head: Atraumatic.  Eyes: EOM are normal.  Neck: Normal range of motion.  Respiratory: Effort normal.  Musculoskeletal: Normal range of motion.  Walks with walker  Neurological: She is alert and oriented to person, place, and time.    Review of Systems  Constitutional: Positive for malaise/fatigue. Negative for chills, diaphoresis, fever and weight loss.  HENT: Negative.   Eyes: Negative.   Respiratory: Negative.   Cardiovascular: Negative.   Gastrointestinal: Negative.   Genitourinary: Negative.   Musculoskeletal: Positive for joint pain.  Skin: Negative.   Neurological:  Negative.  Negative for weakness.  Endo/Heme/Allergies: Negative.   Psychiatric/Behavioral: Positive for depression, substance abuse and suicidal ideas.    Blood pressure 131/61, pulse 72, temperature 98.3 F (36.8 C), temperature source Oral, resp. rate 18, height 5\' 4"  (1.626 m), weight 71.9 kg (158 lb 8.2 oz), SpO2 100 %.Body mass index is 27.21 kg/m.  General Appearance: Fairly Groomed  Eye Contact:  Good  Speech:  Clear and Coherent  Volume:  Normal  Mood:  Dysphoric  Affect:  Appropriate and Congruent  Thought Process:  Linear and Descriptions of Associations: Intact  Orientation:  Full (Time, Place, and Person)  Thought Content:  Hallucinations: None  Suicidal Thoughts:  Yes.  without intent/plan  Homicidal Thoughts:  No  Memory:  Immediate;   Fair Recent;   Fair Remote;   Fair  Judgement:  Poor  Insight:  Shallow  Psychomotor Activity:   Decreased  Concentration:  Concentration: Fair and Attention Span: Fair  Recall:  [employer] of Knowledge:  Fair  Language:  Good  Akathisia:  No  Handed:    AIMS (if indicated):     Assets:  Agricultural consultant Housing  ADL's:  Intact  Cognition:  WNL  Sleep:  Number of Hours: 8     Treatment Plan Summary:  Patient is a 51 year old African-American female with a multitude of medical problems along with depression and substance abuse. Patient reports that her mood has worsened significantly since she was started on dialysis about a month ago. She complains of how having chronic pain and not receiving the appropriate treatment for it. She requests to be started back on Adderall which helped her in the past and OxyContin which was prescribed to her for pain in the past she feels that with the right medications she can move on with her life.  Patient has been in the emergency department a multitude of times. She has history of substance abuse mainly cocaine use, and noncompliance with dialysis, insulin or antihypertensives. There is documentation of her having a MI transportation and also a stroke. Also has diabetic ulcers and already suffered an amputation of her left foot.  In 2017 the patient was placed in a assisted living facility after the surgery on her foot but left after a while. We have offered the patient assistance with placing her back in a assisted living facility where she can receive assistance but patient refuses. Says that she is planning on leaving to [place] in about a month to live with another sister.   Major depressive disorder the patient  has been started on sertraline 50 mg by mouth daily. The sertraline will be given after dialysis.  Anxiety: continue haloperidol 0.5 mg 3 times a day prn  to help with her complaints of agitation and anxiety.  Found it beneficial.  Ordered Ativan 2 mg IV prior to dialysis in order to help reduce the  tension and nervousness prior to each procedure  Insomnia: For insomnia continue trazodone 25 mg by mouth at bed time  Hypertension continue Norvasc 10 mg by mouth daily, continue Coreg 25 mg twice a day. Continue clonidine 0.1 mg every 8 hours.  Congestive heart failure continue Lasix 120 mg by mouth daily  Endstage renal fair on dialysis: Nephrology has been consulted/seen on 1/25. Last dialysis was given on January 30  Hyperphosphatemia continue PhosLo 1334 mg 3 times a day   Diabetes: Patient has been started on a diabetic diet. Orders have been given for supplemental insulin. Continue  Lantus 22U at bedtime and NovoLog 5 U 3 times a day. Appears capillary blood glucose has been better control over the last few days. Patient however is frequently noncompliant with dietary restrictions. She has been eating snacks other than the ones order to her which are appropriate for diabetes.   Diabetic ulcers: Patient has been found to have an ulcer in her toe and calf. Seen by wound care on 1/25. Recommended betadine daily to ulcers  Chronic pain: Patient is currently not follow-up by a primary care doctor and does not have pain management either. She is actively abusing substances. She does appear to have clear causes for chronic pain. She has an old fracture in her right elbow that has not being repaired surgically because of her low hemoglobin and platelet count. She also had amputation of her left foot due to poorly controlled diabetes. Very likely she also suffers from diabetic neuropathy. For now she has been started  on roxicodone 5 mg every 6 hours when necessary. 20 tabs will be given upon discharge. She told me she has a follow-up with a new primary care doctor at [facility] on February 14.  Dyslipidemia continue Lipitor 40 mg by mouth daily  Tobacco use disorder continue nicotine patch 14 mg a day  Unsteady gait patient to use a walker. She has been placed on fall  precautions  Diet: Patient is on a renal diet with low carbohydrates. Seen by Dietician on 1/25  Disposition: Pt has offered assistance with placement in an ALF but refused. Today she is saying she wants to go to [facility], an [facility]F in [place]  Follow-up: will need to be set up to f/u with psychiatrist in [place].  Has a f/u with [facility] in Feb for primary care.  Today she is requesting to go to ALF. PPD will be ordered. SW has been made aware  Records from prior ER and outpt visits have been reviewed.  Spoke with nephrology, and dietician today.  Case discussed with nursing and SW.    Hildred Priest, MD 09/03/2016, 2:51 PM

## 2016-09-03 NOTE — Progress Notes (Signed)
Patient ID: [name], female   DOB: 02/11/1966, 51 y.o.   MRN: [id number] A&Ox3, VSS, denied pain, CBG monitored, mood and affect sad, flat, not optimistic; she is worried about disposition and HD after discharge; denied SI/HI, denied AV/H. Overall appearance: neat.

## 2016-09-03 NOTE — Progress Notes (Signed)
Behavioral med technician chairside with patient.

## 2016-09-03 NOTE — Progress Notes (Signed)
Hemodialysis treatment started. 

## 2016-09-03 NOTE — Progress Notes (Signed)
Post hemodialysis treatment:completed 3.5hours of tx with 2liters net removal.Scheduled Ativan and Hectorol given during tx.Hemastasis achieved,sites dressed with gauze/taped.

## 2016-09-03 NOTE — Progress Notes (Signed)
D: Patient denies SI/HI and A/V hallucinations  A: Monitored q 15 minutes; patient encouraged to attend groups; patient educated about medications as needed; patient given medications per physician orders; patient encouraged to express feelings and/or concerns  R: Patient is pleasant and cooperative; patient is appropriate to circumstances; patient wanted to lay down after dialysis; patient refused her 1400 hr medications and vitals; patient stated she wanted to rest; will reassess at next medication time

## 2016-09-03 NOTE — Progress Notes (Signed)
Subjective:  Patient completed hemodialysis today. Tolerated well. Her weight at the moment is 71.9 kg.    Objective:  Vital signs in last 24 hours:  Temp:  [98.3 F (36.8 C)-98.8 F (37.1 C)] 98.3 F (36.8 C) (01/30 1336) Pulse Rate:  [66-72] 72 (01/30 1336) Resp:  [16-18] 18 (01/30 1336) BP: (107-138)/(57-67) 131/61 (01/30 1336) SpO2:  [99 %-100 %] 100 % (01/30 1336) Weight:  [71.9 kg (158 lb 8.2 oz)-73.3 kg (161 lb 9.6 oz)] 71.9 kg (158 lb 8.2 oz) (01/30 1336)  Weight change:  Filed Weights   08/31/16 1057 09/03/16 0945 09/03/16 1336  Weight: 67.9 kg (149 lb 11.1 oz) 73.3 kg (161 lb 9.6 oz) 71.9 kg (158 lb 8.2 oz)    Intake/Output:    Intake/Output Summary (Last 24 hours) at 09/03/16 1531 Last data filed at 09/03/16 1336  Gross per 24 hour  Intake              240 ml  Output             2000 ml  Net            -1760 ml     Physical Exam: General: No acute distress, sitting in dialysis chair   HEENT Pale conjunctiva, moist oral mucous membranes   Neck Supple   Pulm/lungs Normal breathing effort,    CVS/Heart Regular rate and rhythm, no rub or gallop   Abdomen:  Soft, nontender, nondistended   Extremities: trace pitting edema bilaterally   Neurologic: Alert, oriented   Skin: No acute rashes   Access: Left arm AV graft        Basic Metabolic Panel:   Recent Labs Lab 08/27/16 2009 08/29/16 1320 09/03/16 1016  NA 135 131*  --   K 3.9 4.0  --   CL 94* 93*  --   CO2 27 27  --   GLUCOSE 293* 256*  --   BUN 17 64*  --   CREATININE 3.14* 5.62*  --   CALCIUM 8.3* 8.1*  --   PHOS 3.0 3.8 6.2*     CBC:  Recent Labs Lab 08/29/16 1320  WBC 2.9*  HGB 7.4*  HCT 22.9*  MCV 92.9  PLT 130*      Microbiology:  Recent Results (from the past 720 hour(s))  MRSA PCR Screening     Status: None   Collection Time: 08/29/16  9:27 AM  Result Value Ref Range Status   MRSA by PCR NEGATIVE NEGATIVE Final    Comment:        The GeneXpert MRSA Assay  (FDA approved for NASAL specimens only), is one component of a comprehensive MRSA colonization surveillance program. It is not intended to diagnose MRSA infection nor to guide or monitor treatment for MRSA infections.     Coagulation Studies: No results for input(s): LABPROT, INR in the last 72 hours.  Urinalysis: No results for input(s): COLORURINE, LABSPEC, PHURINE, GLUCOSEU, HGBUR, BILIRUBINUR, KETONESUR, PROTEINUR, UROBILINOGEN, NITRITE, LEUKOCYTESUR in the last 72 hours.  Invalid input(s): APPERANCEUR    Imaging: No results found.   Medications:    . amLODipine  10 mg Oral QHS  . atorvastatin  40 mg Oral QHS  . calcium acetate  1,334 mg Oral TID WC  . carvedilol  25 mg Oral BID WC  . cloNIDine  0.1 mg Oral Q8H  . doxercalciferol  1 mcg Intravenous Q T,Th,Sa-HD  . feeding supplement (PRO-STAT SUGAR FREE 64)  30 mL Oral BID  .  furosemide  80 mg Oral BID  . insulin aspart  0-15 Units Subcutaneous TID WC  . insulin aspart  0-5 Units Subcutaneous QHS  . insulin aspart  5 Units Subcutaneous TID WC  . insulin glargine  22 Units Subcutaneous QHS  . LORazepam  2 mg Intravenous Q T,Th,Sa-HD  . multivitamin  1 tablet Oral QHS  . polyethylene glycol  17 g Oral BID  . povidone-iodine   Topical Daily  . sertraline  50 mg Oral Daily  . traZODone  25 mg Oral QHS  . tuberculin  5 Units Intradermal Once   acetaminophen, alum & mag hydroxide-simeth, haloperidol, oxyCODONE  Assessment/ Plan:  51 y.o.AA female with End-stage renal disease, bipolar disorder, chronic systolic and diastolic congestive heart failure, EF 40%, chronic pain syndrome, depression, GERD, hepatitis C, hyperlipidemia, diabetes  [address] [place] [facility]/ TTS/ 3.5 hrs/ EDW  69 kg  1. ESRD 2. AOCKD 3. LE Edema, Volume overload 4. SHPTH  Plan: Patient completed hemodialysis today.  Her weight currently is 71.9 kg.  We may need to consider an extra dialysis session this week.  Patient has been  hesitant to perform this in the past.  Phosphorus high at 6.2.  We will continue to monitor serum phosphorus and consider increasing calcium acetate if this remains high.  Otherwise care as per psychiatry.   LOS: 6 Ilham Roughton 1/30/20183:31 PM

## 2016-09-03 NOTE — Care Management Note (Signed)
Case Management Note  Patient Details  Name: [name] MRN: [id number] Date of Birth: August 24, 1965  Subjective/Objective:      Home Care to address wound care for Diabetic foot wounds and medication compliance              Action/Plan: Call [facility]   Expected Discharge Date:  09/04/16 Actual Discharge              Expected Discharge Plan:  [place]  In-House Referral:     Discharge planning Services  CM Consult  Post Acute Care Choice:  Durable Medical Equipment, Home Health Choice offered to:  Patient  DME Arranged:  [facility] DME Agency:  [place] Arranged:    [facility] Agency:  [place]  Status of Service:     If discussed at [place] of Stay Meetings, dates discussed:    Additional Comments:  Orlean Bradford, RN 09/03/2016, 1:49 PM

## 2016-09-03 NOTE — Progress Notes (Signed)
Post hd tx 

## 2016-09-03 NOTE — Progress Notes (Signed)
Hemodialysis completed. 

## 2016-09-03 NOTE — Progress Notes (Signed)
Recreation Therapy Notes  Date: 01.30.18 Time: 9:30 am Location: Craft Room  Group Topic: Self-expression  Goal Area(s) Addresses:  Patient will identify one color per emotion listed on wheel. Patient will verbalize one emotion experienced during session. Patient will be educated on other forms of self-expression.  Behavioral Response: Did not attend  Intervention: Emotion Wheel  Activity: Patients were given an Emotion Wheel worksheet and were instructed to pick a color for each emotion listed on the wheel.  Education: LRT educated patients on different forms of self-expression.  Education Outcome: Patient did not attend group.  Clinical Observations/Feedback: Patient did not attend group.  Leonette Monarch, LRT/CTRS 09/03/2016 10:05 AM

## 2016-09-03 NOTE — Plan of Care (Signed)
Problem: Activity: Goal: Sleeping patterns will improve Outcome: Progressing HD prepared; patient slept for Estimated Hours of 8; every 15 minutes safety round maintained, no injury or falls during this shift.
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
## 2016-09-04 LAB — GLUCOSE, CAPILLARY
Glucose-Capillary: 186 mg/dL — ABNORMAL HIGH (ref 65–99)
Glucose-Capillary: 80 mg/dL (ref 65–99)

## 2016-09-04 NOTE — Progress Notes (Signed)
Patient with appropriate affect, cooperative behavior with meals, meds and plan of care. No SI/HI at this time. Meets with MD ans Education officer, museum. Patient to discharge today. Safety maintained.

## 2016-09-04 NOTE — NC FL2 (Signed)
[place] LEVEL OF CARE SCREENING TOOL     IDENTIFICATION  Patient Name: [name] Birthdate: 27-Apr-1966 Sex: female Admission Date (Current Location): 08/28/2016  [place] and [place] Number:  [name] [id number] [place] and Address:  [facility], [address] [place], [place] [address]      Provider Number: [id number]  Attending Physician Name and Address:  Golden Hurter*  Relative Name and Phone Number:       Current Level of Care: Hospital Recommended Level of Care: [place] Prior Approval Number:    Date Approved/Denied:   PASRR Number:    Discharge Plan: Other (Comment) (ALF)    Current Diagnoses: Patient Active Problem List   Diagnosis Date Noted  . Cocaine use disorder, severe, dependence ([place]) 08/29/2016  . Hepatitis C 08/29/2016  . Diabetic ulcer of calf ([place]) 08/29/2016  . Diabetic ulcer of toe ([place]) 08/29/2016  . Cerebrovascular accident (CVA) ([place]) 08/29/2016  . MDD (major depressive disorder) 08/28/2016  . Dyslipidemia associated with type 2 diabetes mellitus ([place]) 11/10/2015  . History of vitamin D deficiency 06/20/2015  . Long term prescription opiate use 06/20/2015  . Chronic pain syndrome 06/19/2015  . Anemia of chronic kidney failure 05/01/2015  . transmetatarsal amputation of the left foot 12/13/2014  . Unilateral visual loss 11/22/2014  . 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 ([place]) 11/22/2014  . Chronic combined systolic and diastolic CHF (congestive heart failure) ([place])   . Anemia, iron deficiency 04/13/2014  . Noncompliance 02/02/2014  . CKD (chronic kidney disease) stage 5, GFR less than 15 ml/min (HCC) 05/18/2013  . Seizure disorder ([place]) 02/09/2013    Orientation RESPIRATION BLADDER Height & Weight     Self, Time, Place, Situation (X4)  Normal Continent Weight: 158 lb 8.2 oz (71.9 kg) Height:  5\' 4"  (162.6 cm)  BEHAVIORAL SYMPTOMS/MOOD NEUROLOGICAL  BOWEL NUTRITION STATUS     (N/A) Continent Diet  AMBULATORY STATUS COMMUNICATION OF NEEDS Skin   Independent Verbally Other (Comment) (Scars due to dialysis treatment)                       Personal Care Assistance Level of Assistance   (N/A)           Functional Limitations Info             SPECIAL CARE FACTORS FREQUENCY                       Contractures      Additional Factors Info  Psychotropic     Psychotropic Info: Antidepressant for depression          Current Medications (09/04/2016):  This is the current hospital active medication list Current Facility-Administered Medications  Medication Dose Route Frequency Provider Last Rate Last Dose  . acetaminophen (TYLENOL) tablet 1,000 mg  1,000 mg Oral Q6H PRN Hildred Priest, MD      . alum & mag hydroxide-simeth (MAALOX/MYLANTA) 200-200-20 MG/5ML suspension 30 mL  30 mL Oral Q4H PRN Hildred Priest, MD      . amLODipine (NORVASC) tablet 10 mg  10 mg Oral QHS Hildred Priest, MD   10 mg at 09/03/16 2140  . atorvastatin (LIPITOR) tablet 40 mg  40 mg Oral QHS Hildred Priest, MD   40 mg at 09/03/16 2140  . calcium acetate (PHOSLO) capsule 1,334 mg  1,334 mg Oral TID WC Hildred Priest, MD   1,334 mg at 09/04/16 0748  .  carvedilol (COREG) tablet 25 mg  25 mg Oral BID WC Hildred Priest, MD   25 mg at 09/04/16 0748  . cloNIDine (CATAPRES) tablet 0.1 mg  0.1 mg Oral Q8H Hildred Priest, MD   0.1 mg at 09/04/16 6945  . doxercalciferol (HECTOROL) injection 1 mcg  1 mcg Intravenous Q T,Th,Sa-HD Hildred Priest, MD   1 mcg at 09/03/16 1051  . feeding supplement (PRO-STAT SUGAR FREE 64) liquid 30 mL  30 mL Oral BID Hildred Priest, MD   30 mL at 09/03/16 1710  . furosemide (LASIX) tablet 80 mg  80 mg Oral BID Murlean Iba, MD   80 mg at 09/04/16 0748  . haloperidol (HALDOL) tablet 0.5 mg  0.5 mg Oral Q8H PRN Hildred Priest, MD   0.5 mg at 09/04/16 0748  . insulin aspart (novoLOG) injection 0-15 Units  0-15 Units Subcutaneous TID WC Hildred Priest, MD   3 Units at 09/04/16 0749  . insulin aspart (novoLOG) injection 0-5 Units  0-5 Units Subcutaneous QHS Hildred Priest, MD   2 Units at 09/03/16 2140  . insulin aspart (novoLOG) injection 5 Units  5 Units Subcutaneous TID WC Hildred Priest, MD   5 Units at 09/04/16 0749  . insulin glargine (LANTUS) injection 22 Units  22 Units Subcutaneous QHS Hildred Priest, MD   22 Units at 09/03/16 2141  . LORazepam (ATIVAN) injection 2 mg  2 mg Intravenous Q T,Th,Sa-HD Hildred Priest, MD   2 mg at 09/03/16 1101  . multivitamin (RENA-VIT) tablet 1 tablet  1 tablet Oral QHS Hildred Priest, MD   1 tablet at 09/03/16 2142  . oxyCODONE (Oxy IR/ROXICODONE) immediate release tablet 5 mg  5 mg Oral Q6H PRN Hildred Priest, MD   5 mg at 09/01/16 2153  . polyethylene glycol (MIRALAX / GLYCOLAX) packet 17 g  17 g Oral BID Hildred Priest, MD   17 g at 09/04/16 0748  . povidone-iodine (BETADINE) 10 % ointment   Topical Daily Hildred Priest, MD      . sertraline (ZOLOFT) tablet 50 mg  50 mg Oral Daily Hildred Priest, MD   50 mg at 09/03/16 1711  . traZODone (DESYREL) tablet 25 mg  25 mg Oral QHS Hildred Priest, MD   25 mg at 09/03/16 2142  . tuberculin injection 5 Units  5 Units Intradermal Once Hildred Priest, MD   5 Units at 09/03/16 1714     Discharge Medications: Please see discharge summary for a list of discharge medications.  Relevant Imaging Results:  Relevant Lab Results:   Additional Information SSN: [id number]  Emilie Rutter, LCSWA

## 2016-09-04 NOTE — Progress Notes (Signed)
Patient ID: [name], female   DOB: 08/31/1965, 51 y.o.   MRN: [id number]  A&Ox3, flat, sad affect, stayed in her room most of the shift, "my blood pressure is too low, they only removed 2 liters during hemodialysis .Marland Kitchen.." CGB = 251 @ bedtime, complied with medications, denied SI/HI.

## 2016-09-04 NOTE — BHH Group Notes (Signed)
[place] Group Notes:  (Nursing/MHT/Case Management/Adjunct)  Date:  09/04/2016  Time:  1:28 AM  Type of Therapy:  Group Therapy  Participation Level:  Active  Participation Quality:  Appropriate  Affect:  Appropriate  Cognitive:  Appropriate  Insight:  Appropriate  Engagement in Group:  Engaged  Modes of Intervention:  Discussion  Summary of Progress/Problems: Pt stated that the dialysis is making her feel worse then she had when she first arrived. Pt feels that she is not ready to be discharged. Staff advised pt to talk to her doctor about these feelings.   Jenetta Downer Lyndon Chapel 09/04/2016, 1:28 AM

## 2016-09-04 NOTE — Progress Notes (Signed)
Recreation Therapy Notes  Date: 01.31.18 Time: 9:30 am Location: Craft Room  Group Topic: Self-esteem  Goal Area(s) Addresses:  Patient will write at least one positive trait about self. Patient will verbalize benefit of having healthy self-esteem.  Behavioral Response: Did not attend  Intervention: I Am  Activity: Patients were given worksheets with the letter I on it and were instructed to write as many positive traits inside the letter.  Education: LRT educated patients on ways to increase their self-esteem.  Education Outcome: Patient did not attend group.  Clinical Observations/Feedback: Patient did not attend group.  Leonette Monarch, LRT/CTRS 09/04/2016 10:21 AM

## 2016-09-04 NOTE — Plan of Care (Signed)
Problem: Activity: Goal: Sleeping patterns will improve Outcome: Progressing Patient slept for Estimated Hours of 7.45; every 15 minutes safety round maintained, no injury or falls during this shift.

## 2016-09-04 NOTE — Tx Team (Signed)
Interdisciplinary Treatment and Diagnostic Plan Update  09/04/2016 Time of Session: 10:30 AM [name] MRN: [id number]  Principal Diagnosis: MDD (major depressive disorder)  Secondary Diagnoses: Principal Problem:   MDD (major depressive disorder) Active Problems:   Seizure disorder ([place])   CKD (chronic kidney disease) stage 5, GFR less than 15 ml/min (HCC)   Noncompliance   Anemia, iron deficiency   Chronic combined systolic and diastolic CHF (congestive heart failure) (HCC)   Unilateral visual loss   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 ([place])   transmetatarsal amputation of the left foot   Anemia of chronic kidney failure   Chronic pain syndrome   History of vitamin D deficiency   Dyslipidemia associated with type 2 diabetes mellitus (HCC)   Cocaine use disorder, severe, dependence ([place])   Hepatitis C   Diabetic ulcer of calf (HCC)   Diabetic ulcer of toe ([place])   Cerebrovascular accident (CVA) ([place])   Current Medications:  Current Facility-Administered Medications  Medication Dose Route Frequency Provider Last Rate Last Dose  . acetaminophen (TYLENOL) tablet 1,000 mg  1,000 mg Oral Q6H PRN Hildred Priest, MD      . alum & mag hydroxide-simeth (MAALOX/MYLANTA) 200-200-20 MG/5ML suspension 30 mL  30 mL Oral Q4H PRN Hildred Priest, MD      . amLODipine (NORVASC) tablet 10 mg  10 mg Oral QHS Hildred Priest, MD   10 mg at 09/03/16 2140  . atorvastatin (LIPITOR) tablet 40 mg  40 mg Oral QHS Hildred Priest, MD   40 mg at 09/03/16 2140  . calcium acetate (PHOSLO) capsule 1,334 mg  1,334 mg Oral TID WC Hildred Priest, MD   1,334 mg at 09/04/16 0748  . carvedilol (COREG) tablet 25 mg  25 mg Oral BID WC Hildred Priest, MD   25 mg at 09/04/16 0748  . cloNIDine (CATAPRES) tablet 0.1 mg  0.1 mg Oral Q8H Hildred Priest, MD   0.1 mg at 09/04/16 8101  . doxercalciferol (HECTOROL) injection 1 mcg  1 mcg  Intravenous Q T,Th,Sa-HD Hildred Priest, MD   1 mcg at 09/03/16 1051  . feeding supplement (PRO-STAT SUGAR FREE 64) liquid 30 mL  30 mL Oral BID Hildred Priest, MD   30 mL at 09/03/16 1710  . furosemide (LASIX) tablet 80 mg  80 mg Oral BID Murlean Iba, MD   80 mg at 09/04/16 0748  . haloperidol (HALDOL) tablet 0.5 mg  0.5 mg Oral Q8H PRN Hildred Priest, MD   0.5 mg at 09/04/16 0748  . insulin aspart (novoLOG) injection 0-15 Units  0-15 Units Subcutaneous TID WC Hildred Priest, MD   3 Units at 09/04/16 0749  . insulin aspart (novoLOG) injection 0-5 Units  0-5 Units Subcutaneous QHS Hildred Priest, MD   2 Units at 09/03/16 2140  . insulin aspart (novoLOG) injection 5 Units  5 Units Subcutaneous TID WC Hildred Priest, MD   5 Units at 09/04/16 0749  . insulin glargine (LANTUS) injection 22 Units  22 Units Subcutaneous QHS Hildred Priest, MD   22 Units at 09/03/16 2141  . LORazepam (ATIVAN) injection 2 mg  2 mg Intravenous Q T,Th,Sa-HD Hildred Priest, MD   2 mg at 09/03/16 1101  . multivitamin (RENA-VIT) tablet 1 tablet  1 tablet Oral QHS Hildred Priest, MD   1 tablet at 09/03/16 2142  . oxyCODONE (Oxy IR/ROXICODONE) immediate release tablet 5 mg  5 mg Oral Q6H PRN Hildred Priest, MD   5 mg at 09/01/16 2153  . polyethylene  glycol (MIRALAX / GLYCOLAX) packet 17 g  17 g Oral BID Hildred Priest, MD   17 g at 09/04/16 0748  . povidone-iodine (BETADINE) 10 % ointment   Topical Daily Hildred Priest, MD      . sertraline (ZOLOFT) tablet 50 mg  50 mg Oral Daily Hildred Priest, MD   50 mg at 09/03/16 1711  . traZODone (DESYREL) tablet 25 mg  25 mg Oral QHS Hildred Priest, MD   25 mg at 09/03/16 2142  . tuberculin injection 5 Units  5 Units Intradermal Once Hildred Priest, MD   5 Units at 09/03/16 1714   PTA Medications: Prescriptions Prior to  Admission  Medication Sig Dispense Refill Last Dose  . acetaminophen (TYLENOL) 325 MG tablet Take 2 tablets (650 mg total) by mouth every 6 (six) hours as needed for mild pain (or Fever >/= 101).   unk  . Amino Acids-Protein Hydrolys (FEEDING SUPPLEMENT, PRO-STAT SUGAR FREE 64,) LIQD Take 30 mLs by mouth 2 (two) times daily. 900 mL 0 Past Week at Unknown time  . amLODipine (NORVASC) 10 MG tablet Take 1 tablet (10 mg total) by mouth at bedtime. 30 tablet 0 Past Week at Unknown time  . atorvastatin (LIPITOR) 40 MG tablet Take 1 tablet (40 mg total) by mouth at bedtime. 30 tablet 0 Past Week at Unknown time  . B Complex-C-Folic Acid (RENAL VITAMIN PO) Take 1 tablet by mouth at bedtime.   Past Week at Unknown time  . calcium acetate (PHOSLO) 667 MG capsule Take 2 capsules (1,334 mg total) by mouth 3 (three) times daily with meals. 120 capsule 0 Past Week at Unknown time  . carvedilol (COREG) 25 MG tablet Take 25 mg by mouth 2 (two) times daily with a meal.  3 08/25/2016 at 1600  . cloNIDine (CATAPRES) 0.1 MG tablet Take 1 tablet (0.1 mg total) by mouth every 8 (eight) hours. 90 tablet 0 08/25/2016 at Unknown time  . diphenhydrAMINE (BENADRYL) 25 mg capsule Take 1 capsule (25 mg total) by mouth every 6 (six) hours as needed for itching. 30 capsule 0 unk  . folic acid (FOLVITE) 1 MG tablet Take 1 tablet (1 mg total) by mouth 2 (two) times daily with a meal. 60 tablet 0 Past Week at Unknown time  . furosemide (LASIX) 40 MG tablet Take 120 mg by mouth daily.   08/25/2016 at Unknown time  . HYDROcodone-acetaminophen (NORCO/VICODIN) 5-325 MG tablet Take 1-2 tablets by mouth every 6 (six) hours as needed. (Patient not taking: Reported on 08/27/2016) 10 tablet 0 Not Taking at Unknown time  . insulin aspart (NOVOLOG) 100 UNIT/ML injection Inject 0-12 Units into the skin 3 (three) times daily before meals. Per sliding scale inject SQ <150=0units, 151-200=2 units, 201-250=4units, 251-300=6units,301-350=8 units,  351-400=10 units, >/= 401=12 units (Patient taking differently: Inject 2-12 Units into the skin 3 (three) times daily before meals. Inject 2 units subcutaneously daily before meals plus sliding scale adjustment based on CBG) 10 mL 0 Past Week at Unknown time  . insulin glargine (LANTUS) 100 UNIT/ML injection Inject 0.25 mLs (25 Units total) into the skin at bedtime.   Past Week at Unknown time  . lidocaine-prilocaine (EMLA) cream Apply 1 application topically See admin instructions. Apply to dialysis site one hour prior to dialysis   Past Week at Unknown time  . LORazepam (ATIVAN) 0.5 MG tablet Take 0.5 mg by mouth See admin instructions. Take 1 tablet (0.5 mg) by mouth prior to dialysis and at bedtime as needed  for sleep   Past Week at Unknown time  . polyethylene glycol (MIRALAX / GLYCOLAX) packet Take 17 g by mouth daily. 14 each 0 Past Week at Unknown time  . polyvinyl alcohol (LIQUIFILM TEARS) 1.4 % ophthalmic solution Place 2 drops into both eyes every 6 (six) hours as needed for dry eyes.    unk  . senna-docusate (SENOKOT-S) 8.6-50 MG tablet Take 1 tablet by mouth 2 (two) times daily. 30 tablet 0 Past Week at Unknown time  . sevelamer carbonate (RENVELA) 800 MG tablet Take 3 tablets (2,400 mg total) by mouth 3 (three) times daily with meals. 270 tablet 0 Past Week at Unknown time    Patient Stressors: Health problems Medication change or noncompliance Occupational concerns Substance abuse  Patient Strengths: Ability for insight Capable of independent living Supportive family/friends  Treatment Modalities: Medication Management, Group therapy, Case management,  1 to 1 session with clinician, Psychoeducation, Recreational therapy.   Physician Treatment Plan for Primary Diagnosis: MDD (major depressive disorder) Long Term Goal(s): Improvement in symptoms so as ready for discharge Improvement in symptoms so as ready for discharge   Short Term Goals: Ability to identify changes in  lifestyle to reduce recurrence of condition will improve Ability to verbalize feelings will improve Ability to disclose and discuss suicidal ideas Ability to demonstrate self-control will improve Ability to identify and develop effective coping behaviors will improve Compliance with prescribed medications will improve Ability to identify triggers associated with substance abuse/mental health issues will improve Ability to identify changes in lifestyle to reduce recurrence of condition will improve Ability to demonstrate self-control will improve  Medication Management: Evaluate patient's response, side effects, and tolerance of medication regimen.  Therapeutic Interventions: 1 to 1 sessions, Unit Group sessions and Medication administration.  Evaluation of Outcomes: Adequate for discharge.  Physician Treatment Plan for Secondary Diagnosis: Principal Problem:   MDD (major depressive disorder) Active Problems:   Seizure disorder (HCC)   CKD (chronic kidney disease) stage 5, GFR less than 15 ml/min (HCC)   Noncompliance   Anemia, iron deficiency   Chronic combined systolic and diastolic CHF (congestive heart failure) (HCC)   Unilateral visual loss   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 ([place])   transmetatarsal amputation of the left foot   Anemia of chronic kidney failure   Chronic pain syndrome   History of vitamin D deficiency   Dyslipidemia associated with type 2 diabetes mellitus (HCC)   Cocaine use disorder, severe, dependence ([place])   Hepatitis C   Diabetic ulcer of calf (HCC)   Diabetic ulcer of toe ([place])   Cerebrovascular accident (CVA) ([place])  Long Term Goal(s): Improvement in symptoms so as ready for discharge Improvement in symptoms so as ready for discharge   Short Term Goals: Ability to identify changes in lifestyle to reduce recurrence of condition will improve Ability to verbalize feelings will improve Ability to disclose and discuss suicidal ideas Ability  to demonstrate self-control will improve Ability to identify and develop effective coping behaviors will improve Compliance with prescribed medications will improve Ability to identify triggers associated with substance abuse/mental health issues will improve Ability to identify changes in lifestyle to reduce recurrence of condition will improve Ability to demonstrate self-control will improve     Medication Management: Evaluate patient's response, side effects, and tolerance of medication regimen.  Therapeutic Interventions: 1 to 1 sessions, Unit Group sessions and Medication administration.  Evaluation of Outcomes: Adequate for discharge.   RN Treatment Plan for Primary Diagnosis: MDD (major  depressive disorder) Long Term Goal(s): Knowledge of disease and therapeutic regimen to maintain health will improve  Short Term Goals: Ability to remain free from injury will improve, Ability to demonstrate self-control, Ability to participate in decision making will improve, Ability to disclose and discuss suicidal ideas and Compliance with prescribed medications will improve  Medication Management: RN will administer medications as ordered by provider, will assess and evaluate patient's response and provide education to patient for prescribed medication. RN will report any adverse and/or side effects to prescribing provider.  Therapeutic Interventions: 1 on 1 counseling sessions, Psychoeducation, Medication administration, Evaluate responses to treatment, Monitor vital signs and CBGs as ordered, Perform/monitor CIWA, COWS, AIMS and Fall Risk screenings as ordered, Perform wound care treatments as ordered.  Evaluation of Outcomes: Adequate for discharge.   LCSW Treatment Plan for Primary Diagnosis: MDD (major depressive disorder) Long Term Goal(s): Safe transition to appropriate next level of care at discharge, Engage patient in therapeutic group addressing interpersonal concerns.  Short Term  Goals: Engage patient in aftercare planning with referrals and resources, Increase social support, Increase ability to appropriately verbalize feelings, Identify triggers associated with mental health/substance abuse issues and Increase skills for wellness and recovery  Therapeutic Interventions: Assess for all discharge needs, 1 to 1 time with Social worker, Explore available resources and support systems, Assess for adequacy in community support network, Educate family and significant other(s) on suicide prevention, Complete Psychosocial Assessment, Interpersonal group therapy.  Evaluation of Outcomes: Adequate for discharge.   Progress in Treatment: Attending groups: No. Participating in groups: No. Taking medication as prescribed: Yes. Toleration medication: Yes. Family/Significant other contact made: No, will contact:  Pt refused family contact. Patient understands diagnosis: Yes. Discussing patient identified problems/goals with staff: Yes. Medical problems stabilized or resolved: Yes. Denies suicidal/homicidal ideation: Yes. Issues/concerns per patient self-inventory: No.  New problem(s) identified: No, Describe:  None identified.  Discharge Plan or Barriers: Pt refused ALF placement.  Reason for Continuation of Hospitalization: Anxiety Depression Suicidal ideation  Estimated Length of Stay: D/C 09/04/2016  Attendees: Patient: [name]  09/04/2016 10:51 AM  Physician: Dr. Merlyn Albert, MD  09/04/2016 10:51 AM  Nursing: Polly Cobia, RN 09/04/2016 10:51 AM  RN Care Manager: 09/04/2016 10:51 AM  Social Worker: Glorious Peach, MSW, LCSW-A 09/04/2016 10:51 AM  Recreational Therapist: Everitt Amber, LRT, CTRS  09/04/2016 10:51 AM    Scribe for Treatment Team: Emilie Rutter, [place] 09/04/2016 10:51 AM

## 2016-09-04 NOTE — Progress Notes (Signed)
  [facility] Adult Case Management Discharge Plan :  Will you be returning to the same living situation after discharge:  Yes,  home with sister. At discharge, do you have transportation home?: Yes,  cab voucher issued. Do you have the ability to pay for your medications: Yes,  Medicaid/Medicare.  Release of information consent forms completed and in the chart;  Patient's signature needed at discharge.  Patient to Follow up at: Follow-up Information    WEBB, CAROL D, MD Follow up.   Specialty:  Family Medicine Contact information: [place] [address] [place] at [place]. Go on 09/11/2016.   Why:  Appointment on this date with therapist Jan Fireman at 10:00am. Arrive at 9am to complete paperwork. Bring insurance card, photo I.D., current medications, and discharge summary to this appointment.  Contact information: [address] [place]  [place],  [place]  [address]  P: [phone] F: [phone] Crisis #: [facility] [place]. Go on 09/30/2016.   Why:  Follow-up appointment on this date with Dr. Adele Schilder at [facility] for medication management.  Contact information: [address], [place]  [place],  [place]  [address]  P: [phone] F: [phone] Crisis #: [place] Follow up on 09/18/2016.   Specialty:  Internal Medicine Why:  primary care visit. Clerance Lav  Physician Assistant Contact information: [address] [place] [address] [phone]           Next level of care provider has access to [place] and Suicide Prevention discussed: Yes,  SPE completed with patient.  Have you used any form of tobacco in the last 30 days? (Cigarettes, Smokeless Tobacco, Cigars, and/or Pipes): Yes  Has patient been referred to the Quitline?: Patient  refused referral  Patient has been referred for addiction treatment: Pt. refused referral  Emilie Rutter, MSW, LCSW-A 09/04/2016, 9:56 AM

## 2016-09-11 ENCOUNTER — Ambulatory Visit (HOSPITAL_COMMUNITY): Payer: Self-pay | Admitting: Psychology

## 2016-09-30 ENCOUNTER — Ambulatory Visit (HOSPITAL_COMMUNITY): Payer: Self-pay | Admitting: Psychiatry

## 2016-10-15 ENCOUNTER — Emergency Department (HOSPITAL_COMMUNITY)
Admission: EM | Admit: 2016-10-15 | Discharge: 2016-10-15 | Disposition: A | Discharge status: Home / Self Care | Payer: Medicare Other | Attending: Emergency Medicine | Admitting: Emergency Medicine

## 2016-10-15 ENCOUNTER — Emergency Department (HOSPITAL_COMMUNITY): Payer: Medicare Other

## 2016-10-15 DIAGNOSIS — E1165 Type 2 diabetes mellitus with hyperglycemia: Secondary | ICD-10-CM | POA: Diagnosis not present

## 2016-10-15 DIAGNOSIS — N186 End stage renal disease: Secondary | ICD-10-CM | POA: Diagnosis not present

## 2016-10-15 DIAGNOSIS — R05 Cough: Secondary | ICD-10-CM | POA: Insufficient documentation

## 2016-10-15 DIAGNOSIS — R739 Hyperglycemia, unspecified: Secondary | ICD-10-CM

## 2016-10-15 DIAGNOSIS — I132 Hypertensive heart and chronic kidney disease with heart failure and with stage 5 chronic kidney disease, or end stage renal disease: Secondary | ICD-10-CM | POA: Diagnosis not present

## 2016-10-15 DIAGNOSIS — I5042 Chronic combined systolic (congestive) and diastolic (congestive) heart failure: Secondary | ICD-10-CM | POA: Insufficient documentation

## 2016-10-15 DIAGNOSIS — Z87891 Personal history of nicotine dependence: Secondary | ICD-10-CM | POA: Diagnosis not present

## 2016-10-15 DIAGNOSIS — Z8673 Personal history of transient ischemic attack (TIA), and cerebral infarction without residual deficits: Secondary | ICD-10-CM | POA: Insufficient documentation

## 2016-10-15 DIAGNOSIS — Z794 Long term (current) use of insulin: Secondary | ICD-10-CM | POA: Insufficient documentation

## 2016-10-15 DIAGNOSIS — R059 Cough, unspecified: Secondary | ICD-10-CM

## 2016-10-15 LAB — CBC WITH DIFFERENTIAL/PLATELET
BASOS PCT: 1 %
Basophils Absolute: 0 10*3/uL (ref 0.0–0.1)
EOS ABS: 0.1 10*3/uL (ref 0.0–0.7)
Eosinophils Relative: 3 %
HCT: 30.5 % — ABNORMAL LOW (ref 36.0–46.0)
HEMOGLOBIN: 9.6 g/dL — AB (ref 12.0–15.0)
LYMPHS ABS: 0.8 10*3/uL (ref 0.7–4.0)
Lymphocytes Relative: 25 %
MCH: 30.1 pg (ref 26.0–34.0)
MCHC: 31.5 g/dL (ref 30.0–36.0)
MCV: 95.6 fL (ref 78.0–100.0)
MONO ABS: 0.2 10*3/uL (ref 0.1–1.0)
MONOS PCT: 7 %
Neutro Abs: 2 10*3/uL (ref 1.7–7.7)
Neutrophils Relative %: 64 %
Platelets: 194 10*3/uL (ref 150–400)
RBC: 3.19 MIL/uL — ABNORMAL LOW (ref 3.87–5.11)
RDW: 16 % — ABNORMAL HIGH (ref 11.5–15.5)
WBC: 3.1 10*3/uL — ABNORMAL LOW (ref 4.0–10.5)

## 2016-10-15 LAB — BASIC METABOLIC PANEL
Anion gap: 13 (ref 5–15)
BUN: 26 mg/dL — AB (ref 6–20)
CALCIUM: 8.1 mg/dL — AB (ref 8.9–10.3)
CHLORIDE: 92 mmol/L — AB (ref 101–111)
CO2: 25 mmol/L (ref 22–32)
CREATININE: 7.52 mg/dL — AB (ref 0.44–1.00)
GFR calc non Af Amer: 6 mL/min — ABNORMAL LOW (ref >60)
GFR, EST AFRICAN AMERICAN: 7 mL/min — AB (ref >60)
Glucose, Bld: 505 mg/dL (ref 65–99)
Potassium: 5.3 mmol/L — ABNORMAL HIGH (ref 3.5–5.1)
SODIUM: 130 mmol/L — AB (ref 135–145)

## 2016-10-15 LAB — CBG MONITORING, ED
GLUCOSE-CAPILLARY: 369 mg/dL — AB (ref 65–99)
Glucose-Capillary: 435 mg/dL — ABNORMAL HIGH (ref 65–99)

## 2016-10-15 MED ORDER — ALBUTEROL SULFATE HFA 108 (90 BASE) MCG/ACT IN AERS: 2.0000 | INHALATION_SPRAY | RESPIRATORY_TRACT | 0 refills | Status: DC | PRN

## 2016-10-15 MED ORDER — INSULIN GLARGINE 100 UNIT/ML ~~LOC~~ SOLN
22.0000 [IU] | Freq: Once | SUBCUTANEOUS | Status: AC
Start: 2016-10-15 — End: 2016-10-15
  Administered 2016-10-15: 22 [IU] via SUBCUTANEOUS
  Filled 2016-10-15: qty 0.22

## 2016-10-15 MED ORDER — INSULIN ASPART 100 UNIT/ML ~~LOC~~ SOLN
5.0000 [IU] | Freq: Once | SUBCUTANEOUS | Status: AC
  Administered 2016-10-15: 5 [IU] via SUBCUTANEOUS
  Filled 2016-10-15: qty 1

## 2016-10-15 MED ORDER — ALBUTEROL SULFATE HFA 108 (90 BASE) MCG/ACT IN AERS
2.0000 | INHALATION_SPRAY | Freq: Once | RESPIRATORY_TRACT | Status: AC
  Administered 2016-10-15: 2 via RESPIRATORY_TRACT
  Filled 2016-10-15: qty 6.7

## 2016-10-15 NOTE — ED Notes (Signed)
Verified with MD Horton that pt could be discharged with current  vitals

## 2016-10-15 NOTE — ED Provider Notes (Signed)
[facility] DEPT Provider Note   CSN: [id number] Arrival date & time: 10/15/16  1696  By signing my name below, I, [name], attest that this documentation has been prepared under the direction and in the presence of [name] Hacker, MD. Electronically Signed: Oleh [name], Scribe. 10/15/16. 1:21 AM.   History   Chief Complaint Chief Complaint  Patient presents with  . Cough    HPI [name] is a 51 y.o. 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 The patient states that for the last 3 days she has had a productive cough with green sputum. Today she also had onset of intermittent dyspnea. At interview, the patient states that she "drank some green tea" pre-arrival and now has no coughing complaints. She denies any dyspnea currently. No chest pain. No fevers or chills. She denies any respiratory history. She quit smoking tobacco. Her last dialysis session was 3 days ago and full treatment.   The history is provided by the patient. No language interpreter was used.  Cough  This is a new problem. The current episode started 2 days ago. The problem occurs every few minutes. Progression since onset: Resolved prior to arrival. The cough is productive of sputum. Associated symptoms include shortness of breath. Pertinent negatives include no chest pain and no chills. Treatments tried: "tea"    Past Medical History:  Diagnosis Date  . Anemia   . Anxiety   . Bipolar 1 disorder ([place])   . Bipolar disorder, unspecified 02/06/2014  . Chronic combined systolic and diastolic CHF (congestive heart failure) (HCC)    EF 40% by echo and 48% by stress test  . Chronic pain syndrome   . Depression   . ESRD on hemodialysis ([place])   . GERD (gastroesophageal reflux disease)   . Glaucoma   . Headache    sinus headaches  . Hepatitis C   . High cholesterol   . History of hiatal hernia   . History of vitamin D  deficiency 06/20/2015  . Hypertension   . MRSA infection ?2014   "on the back of my head; spread throughout my bloodstream"  . Neuropathy ([employer])   . Refusal of blood transfusions as patient is Jehovah's Witness   . Schizoaffective disorder, unspecified condition 02/08/2014  . Seizure [facility]) dx'd 2014   "don't know what kind; last one was ~ 04/2014"  . Stroke [facility])    TIA 2009  . TIA (transient ischemic attack) 2010  . Type II diabetes mellitus ([place])    INSULIN DEPENDENT    Patient Active Problem List   Diagnosis Date Noted  . Cocaine use disorder, severe, dependence ([place]) 08/29/2016  . Hepatitis C 08/29/2016  . Diabetic ulcer of calf ([place]) 08/29/2016  . Diabetic ulcer of toe ([place]) 08/29/2016  . Cerebrovascular accident (CVA) ([place]) 08/29/2016  . MDD (major depressive disorder) 08/28/2016  . Dyslipidemia associated with type 2 diabetes mellitus ([place]) 11/10/2015  . History of vitamin D deficiency 06/20/2015  . Long term prescription opiate use 06/20/2015  . Chronic pain syndrome 06/19/2015  . Anemia of chronic kidney failure 05/01/2015  . transmetatarsal amputation of the left foot 12/13/2014  . Unilateral visual loss 11/22/2014  . 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 ([place]) 11/22/2014  . Chronic combined systolic and diastolic CHF (congestive heart failure) ([place])   . Anemia, iron deficiency 04/13/2014  . Noncompliance 02/02/2014  . CKD (chronic kidney disease) stage 5,  GFR less than 15 ml/min (HCC) 05/18/2013  . Seizure disorder ([place]) 02/09/2013    Past Surgical History:  Procedure Laterality Date  . ABDOMINAL HYSTERECTOMY  2014  . ABDOMINAL HYSTERECTOMY  2010  . AMPUTATION Left 05/21/2013   Procedure: Left Midfoot AMPUTATION;  Surgeon: Newt Minion, MD;  Location: [place];  Service: Orthopedics;  Laterality: Left;  Left Midfoot Amputation  . AV FISTULA PLACEMENT Left 05/04/2015   Procedure: ARTERIOVENOUS (AV) GRAFT INSERTION;  Surgeon: Angelia Mould, MD;   Location: [place];  Service: Vascular;  Laterality: Left;  . COLONOSCOPY WITH PROPOFOL N/A 06/22/2013   Procedure: COLONOSCOPY WITH PROPOFOL;  Surgeon: Jeryl Columbia, MD;  Location: [facility];  Service: Endoscopy;  Laterality: N/A;  . ESOPHAGOGASTRODUODENOSCOPY N/A 05/11/2015   Procedure: ESOPHAGOGASTRODUODENOSCOPY (EGD);  Surgeon: Laurence Spates, MD;  Location[facility] ENDOSCOPY;  Service: Endoscopy;  Laterality: N/A;  . EYE SURGERY Bilateral 2010   Lasik  . FOOT AMPUTATION Left    due diabetic neuropathy, could not feel ulcer on bottom of foot  . REFRACTIVE SURGERY Bilateral 2013  . TONSILLECTOMY  1970's  . TUBAL LIGATION    . TUBAL LIGATION  2000    OB History    Gravida Para Term Preterm AB Living   0 0 0 0 0     SAB TAB Ectopic Multiple Live Births   0 0 0           Home Medications    Prior to Admission medications   Medication Sig Start Date End Date Taking? Authorizing Provider  albuterol (PROVENTIL HFA;VENTOLIN HFA) 108 (90 Base) MCG/ACT inhaler Inhale 2 puffs into the lungs every 4 (four) hours as needed for wheezing or shortness of breath. 10/15/16   [name] Hacker, MD  amLODipine (NORVASC) 10 MG tablet Take 1 tablet (10 mg total) by mouth at bedtime. 09/02/16   Hildred Priest, MD  atorvastatin (LIPITOR) 40 MG tablet Take 1 tablet (40 mg total) by mouth at bedtime. 09/02/16   Hildred Priest, MD  calcium acetate (PHOSLO) 667 MG capsule Take 2 capsules (1,334 mg total) by mouth 3 (three) times daily with meals. 09/02/16   Hildred Priest, MD  carvedilol (COREG) 25 MG tablet Take 1 tablet (25 mg total) by mouth 2 (two) times daily with a meal. 09/02/16   Hildred Priest, MD  cloNIDine (CATAPRES) 0.1 MG tablet Take 1 tablet (0.1 mg total) by mouth every 8 (eight) hours. 09/02/16   Hildred Priest, MD  doxercalciferol (HECTOROL) 4 MCG/2ML injection Inject 0.5 mLs (1 mcg total) into the vein Every Tuesday,Thursday,and Saturday with  dialysis. 09/03/16   Hildred Priest, MD  furosemide (LASIX) 80 MG tablet Take 1 tablet (80 mg total) by mouth 2 (two) times daily. 09/02/16   Hildred Priest, MD  haloperidol (HALDOL) 0.5 MG tablet Take 1 tablet (0.5 mg total) by mouth every 8 (eight) hours as needed for agitation. 09/03/16   Hildred Priest, MD  insulin aspart (NOVOLOG) 100 UNIT/ML injection Inject 5 Units into the skin 3 (three) times daily with meals. 09/03/16   Hildred Priest, MD  insulin glargine (LANTUS) 100 UNIT/ML injection Inject 0.22 mLs (22 Units total) into the skin at bedtime. 09/03/16   Hildred Priest, MD  lidocaine-prilocaine (EMLA) cream Apply 1 application topically See admin instructions. Apply to dialysis site one hour prior to dialysis    Historical Provider, MD  LORazepam (ATIVAN) 2 MG/ML injection Inject 1 mL (2 mg total) into the vein Every Tuesday,Thursday,and Saturday with dialysis. 09/03/16  Hildred Priest, MD  multivitamin (RENA-VIT) TABS tablet Take 1 tablet by mouth at bedtime. 09/02/16   Hildred Priest, MD  oxyCODONE (OXY IR/ROXICODONE) 5 MG immediate release tablet Take 1 tablet (5 mg total) by mouth 3 (three) times daily between meals as needed for severe pain or breakthrough pain. 09/02/16   Hildred Priest, MD  sertraline (ZOLOFT) 50 MG tablet Take 1 tablet (50 mg total) by mouth daily. 09/02/16   Hildred Priest, MD  traZODone (DESYREL) 50 MG tablet Take 0.5 tablets (25 mg total) by mouth at bedtime. 09/02/16   Hildred Priest, MD    Family History Family History  Problem Relation Age of Onset  . Hypertension Mother   . Hypertension Father     Social History Social History  Substance Use Topics  . Smoking status: Former Smoker    Packs/day: 1.00    Years: 20.00    Types: Cigarettes    Quit date: 03/31/2015  . Smokeless tobacco: Never Used     Comment: "stopped smoking in ~ 2013; restarted last  week; stopped again 06/17/2014"  . Alcohol use 1.8 oz/week    3 Standard drinks or equivalent per week     Comment: 05/01/15 patient denies alcohol use.     Allergies   Penicillins; Gabapentin; Lyrica [pregabalin]; Saphris [asenapine]; Tramadol; and Other   Review of Systems Review of Systems  Constitutional: Negative for chills and fever.  HENT: Negative for congestion.   Respiratory: Positive for cough and shortness of breath.   Cardiovascular: Negative for chest pain.  Gastrointestinal: Negative for abdominal pain, nausea and vomiting.  All other systems reviewed and are negative.    Physical Exam Updated Vital Signs BP 163/71   Pulse 73   Temp 98.1 F (36.7 C) (Oral)   Resp 18   LMP  (LMP Unknown)   SpO2 100%   Physical Exam  Constitutional: She is oriented to person, place, and time. No distress.  Chronically ill-appearing, no acute distress  HENT:  Head: Normocephalic and atraumatic.  Mouth/Throat: Oropharynx is clear and moist.  Neck: Neck supple.  Cardiovascular: Normal rate, regular rhythm and normal heart sounds.   No murmur heard. Fistula left upper extremity  Pulmonary/Chest: Effort normal. No respiratory distress. She has wheezes.  Fine crackles bilateral bases  Abdominal: Soft. Bowel sounds are normal. There is no tenderness. There is no guarding.  Musculoskeletal: She exhibits edema.  1+ bilateral lower extremity edema  Neurological: She is alert and oriented to person, place, and time.  Skin: Skin is warm and dry.  Psychiatric: She has a normal mood and affect.  Nursing note and vitals reviewed.    ED Treatments / Results  DIAGNOSTIC STUDIES: Oxygen Saturation is 100 percent on room air which is normal by my interpretation.    COORDINATION OF CARE: 1:12 AM Discussed treatment plan with pt at bedside and pt agreed to plan.  Labs (all labs ordered are listed, but only abnormal results are displayed) Labs Reviewed  CBC WITH  DIFFERENTIAL/PLATELET - Abnormal; Notable for the following:       Result Value   WBC 3.1 (*)    RBC 3.19 (*)    Hemoglobin 9.6 (*)    HCT 30.5 (*)    RDW 16.0 (*)    All other components within normal limits  BASIC METABOLIC PANEL - Abnormal; Notable for the following:    Sodium 130 (*)    Potassium 5.3 (*)    Chloride 92 (*)    Glucose, Bld  505 (*)    BUN 26 (*)    Creatinine, Ser 7.52 (*)    Calcium 8.1 (*)    GFR calc non Af Amer 6 (*)    GFR calc Af Amer 7 (*)    All other components within normal limits  CBG MONITORING, ED - Abnormal; Notable for the following:    Glucose-Capillary 435 (*)    All other components within normal limits  CBG MONITORING, ED - Abnormal; Notable for the following:    Glucose-Capillary 369 (*)    All other components within normal limits    EKG  EKG Interpretation  Date/Time:  Tuesday October 15 2016 01:34:32 EDT Ventricular Rate:  73 PR Interval:    QRS Duration: 98 QT Interval:  457 QTC Calculation: 504 R Axis:   104 Text Interpretation:  Sinus rhythm Left atrial enlargement Right axis deviation Consider left ventricular hypertrophy Borderline T abnormalities, lateral leads Borderline prolonged QT interval Baseline wander in lead(s) III aVL aVF Confirmed by Dina Rich  MD, Hagen Tidd ([id number]) on 10/15/2016 2:03:18 AM       Radiology Dg Chest 2 View  Result Date: 10/15/2016 CLINICAL DATA:  Productive cough for 3 days. History of diabetes, CHF and end-stage renal disease on dialysis. EXAM: CHEST  2 VIEW COMPARISON:  Chest radiograph August 27, 2016 FINDINGS: Cardiac silhouette is mildly enlarged unchanged. Mediastinal silhouette is nonsuspicious. Mild pulmonary vascular congestion. Strandy densities in the lung bases with LEFT lung base scarring and pleural thickening. No pleural effusion. No pneumothorax. Soft tissue planes and included osseous structures are normal. Small C7 ribs. IMPRESSION: Stable cardiomegaly and pulmonary vascular congestion.  Bibasilar atelectasis, less likely pneumonia or confluent edema. Electronically Signed   By: Elon Alas M.D.   On: 10/15/2016 02:34    Procedures Procedures (including critical care time)  Medications Ordered in ED Medications  insulin glargine (LANTUS) injection 22 Units (22 Units Subcutaneous Given 10/15/16 0252)  insulin aspart (novoLOG) injection 5 Units (5 Units Subcutaneous Given 10/15/16 0221)     Initial Impression / Assessment and Plan / ED Course  I have reviewed the triage vital signs and the nursing notes.  Pertinent labs & imaging results that were available during my care of the patient were reviewed by me and considered in my medical decision making (see chart for details).     Patient presents with cough. Reports one-week history of cough. Denies any other respiratory complaints or upper respiratory symptoms. Denies fevers. She is nontoxic. Chronically ill-appearing but otherwise nontoxic. She is in no respiratory distress. Workup notable for a blood glucose of 505. Patient states that she has not taken her insulin today. She was given her nightly Lantus and 5 units of short-acting insulin. Will hold fluids as patient is scheduled for dialysis later today. Chest x-ray does not show any evidence of pneumonia but does show some mild vascular congestion. This could certainly contribute to cough. Patient is also a smoker. Will provide with an inhaler. Repeat blood sugar after insulin administration 369. We'll have patient continue to monitor at home and take insulin as directed. She needs to go to her scheduled dialysis appointment later today.  After history, exam, and medical workup I feel the patient has been appropriately medically screened and is safe for discharge home. Pertinent diagnoses were discussed with the patient. Patient was given return precautions.   Final Clinical Impressions(s) / ED Diagnoses   Final diagnoses:  Cough  Hyperglycemia    New  Prescriptions New Prescriptions  ALBUTEROL (PROVENTIL HFA;VENTOLIN HFA) 108 (90 BASE) MCG/ACT INHALER    Inhale 2 puffs into the lungs every 4 (four) hours as needed for wheezing or shortness of breath.   I personally performed the services described in this documentation, which was scribed in my presence. The recorded information has been reviewed and is accurate.    [name] Hacker, MD 10/15/16 [phone]

## 2016-10-15 NOTE — ED Notes (Signed)
MD at bedside. 

## 2016-10-15 NOTE — ED Notes (Signed)
Pt in X-Ray ?

## 2016-10-15 NOTE — ED Triage Notes (Signed)
Pt presents from home w/ productive cough x 1 week. N/V. No subjective fever. Alert and oriented.

## 2016-10-15 NOTE — Discharge Instructions (Signed)
You were seen today for cough. Your workup is reassuring. You do look like you have some fluid on her lungs that this is likely related to your need for dialysis. You will be given an inhaler given that you smoke. If you develop fevers or any other respiratory symptoms, you need to be reevaluated. Proceed to dialysis today as scheduled. You also need to continue to monitor your blood sugars at home and take your insulin as to record.

## 2016-12-26 IMAGING — DX DG ELBOW COMPLETE 3+V*R*
4 series · 4 of 4 positions shown · non-contrast
Comparison: Radiograph dated 07/22/2014

CLINICAL DATA: 49-year-old female with fall

EXAM:
RIGHT ELBOW - COMPLETE 3+ VIEW

[elbow ap]
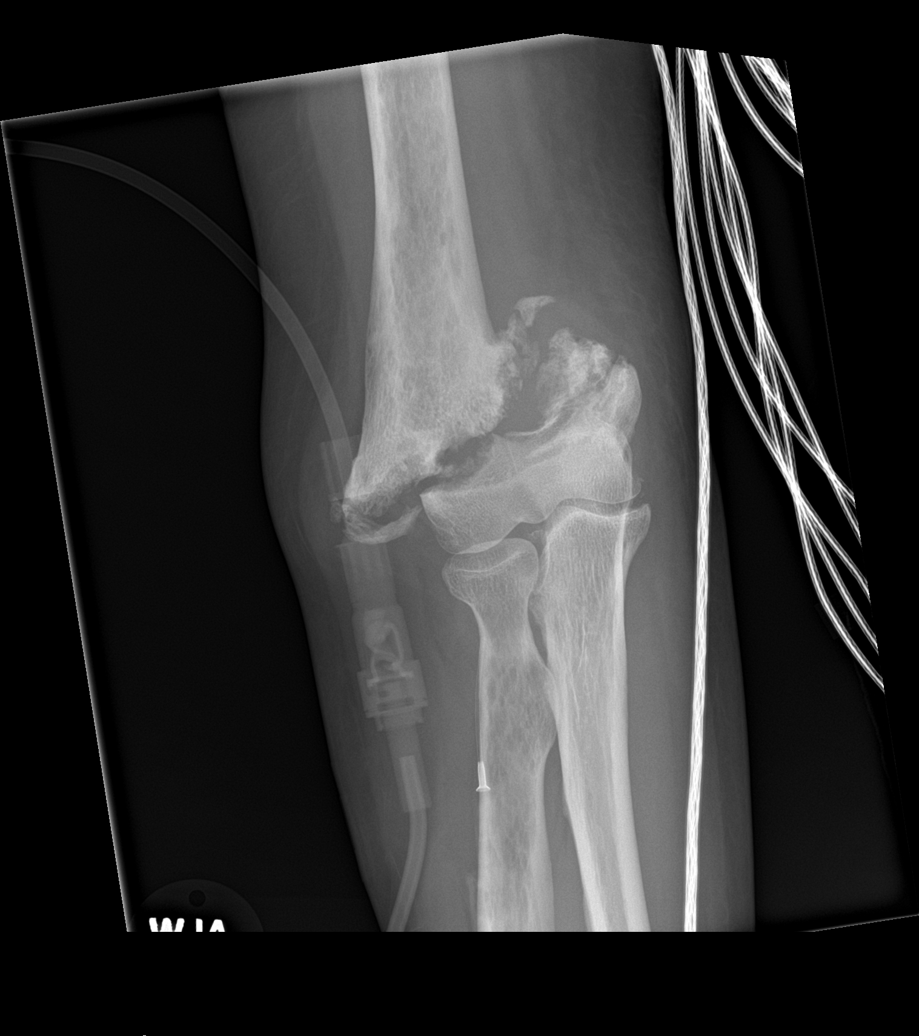

[elbow obl (1 of 2)]
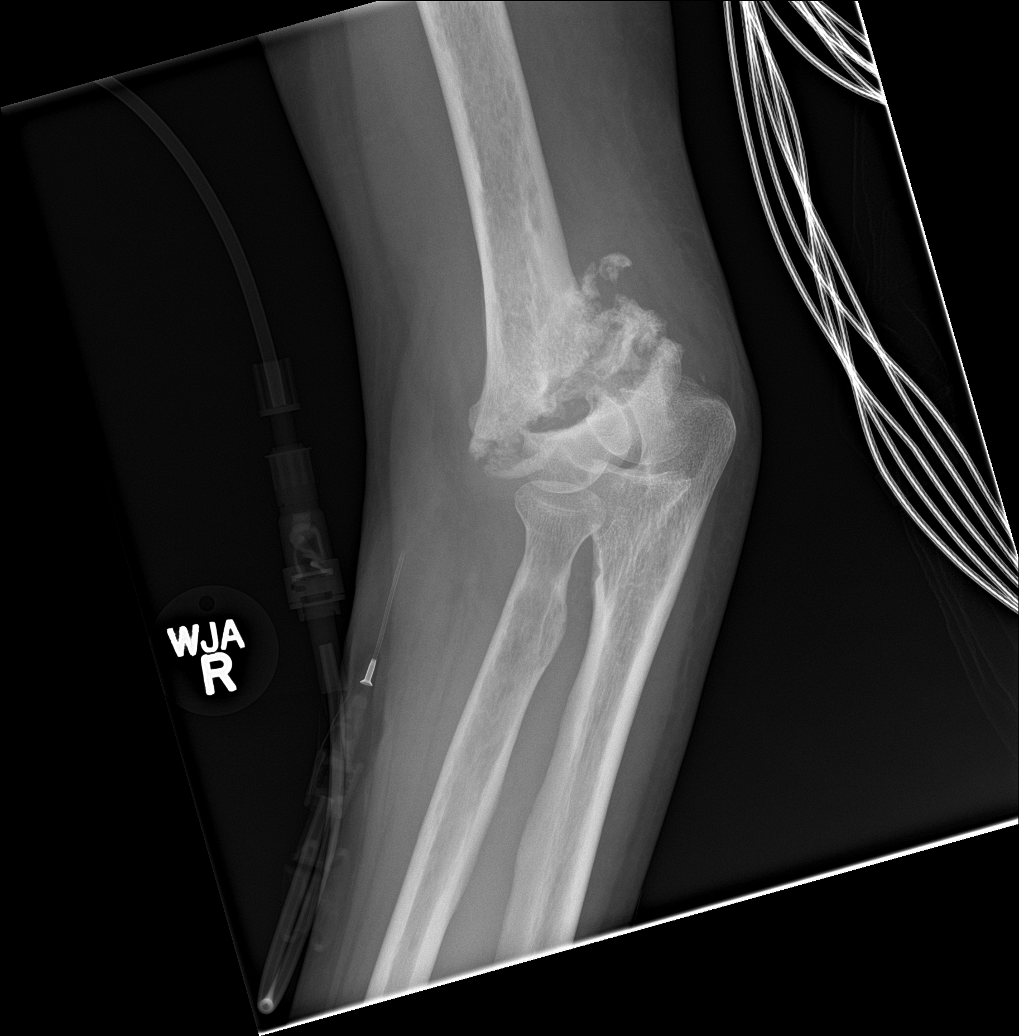

[elbow obl (2 of 2)]
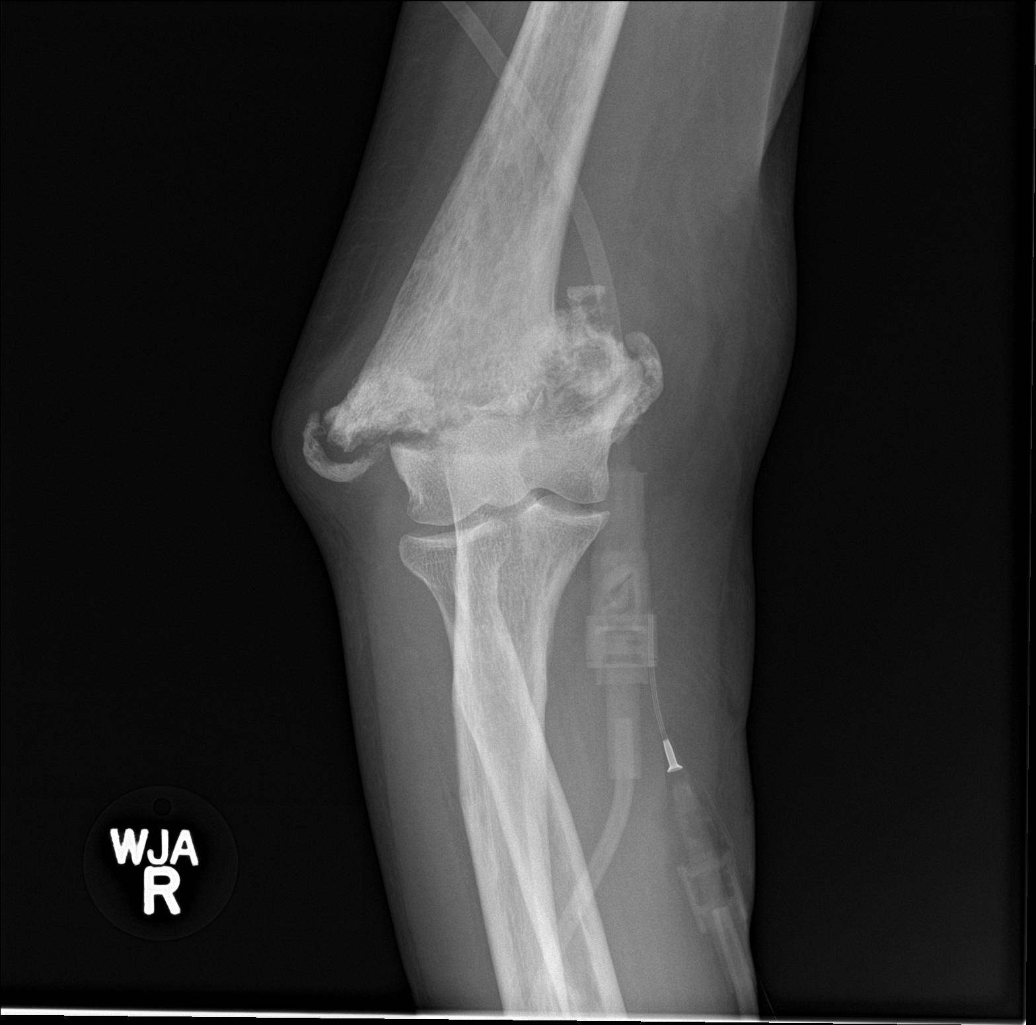

[elbow lat]
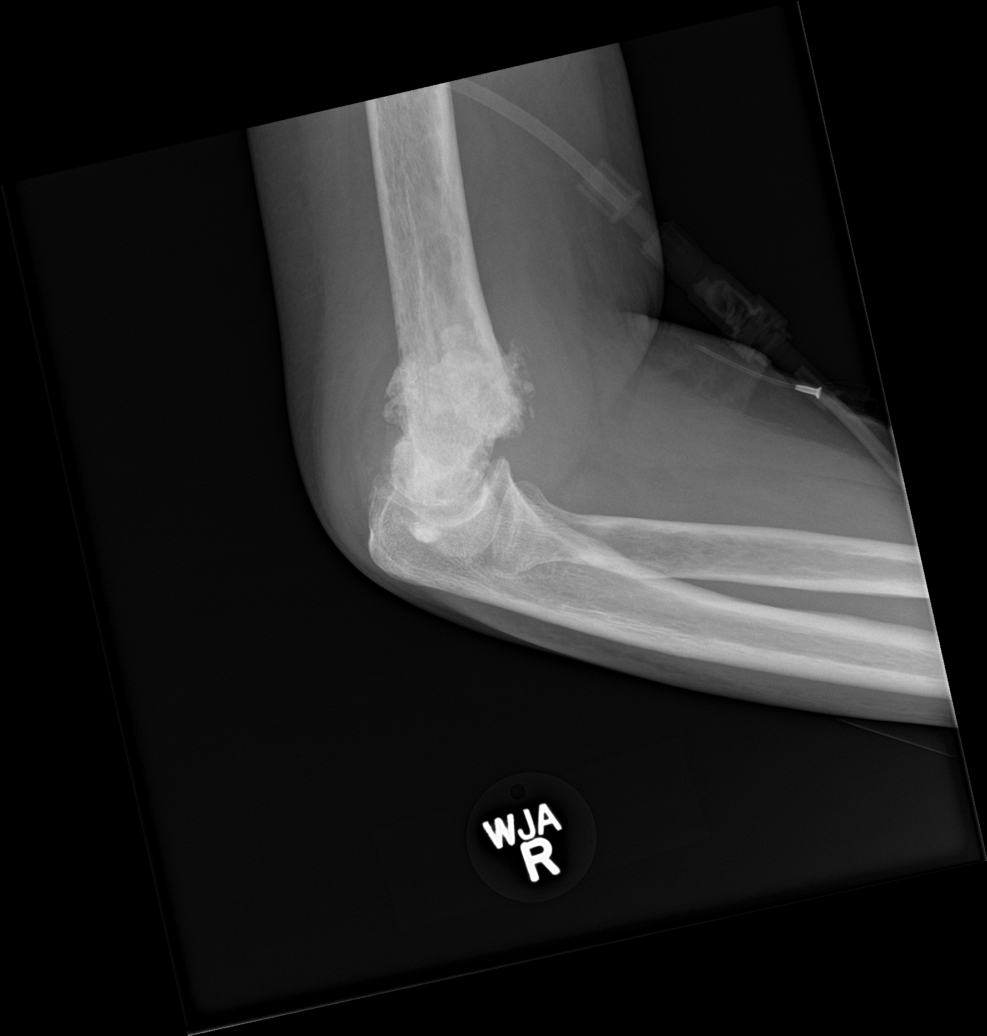

[4 of 4 positions shown; findings below may reference images not displayed]

FINDINGS: There is an old supracondylar fracture of right humerus with with
widening of the fracture line and nonunion. There is fragmentation
and non bridging callus along the fracture. There is approximately
1.5 cm lateral displacement of the distal fracture fragment similar
to prior study. No new fracture identified. There is soft tissue
swelling of the elbow with a small joint effusion.
IMPRESSION: Chronic supracondylar fracture of the right elbow with widened
fracture gap and nonunion similar to prior exam. Stable 1.5 cm
lateral displacement of the distal fracture fragment.

## 2016-12-26 IMAGING — DX DG FOOT COMPLETE 3+V*L*
3 series · 3 of 3 positions shown · non-contrast
Comparison: Radiograph dated [DATE]

CLINICAL DATA: 49-year-old female with fall.

EXAM:
LEFT FOOT - COMPLETE 3+ VIEW

[foot ap]
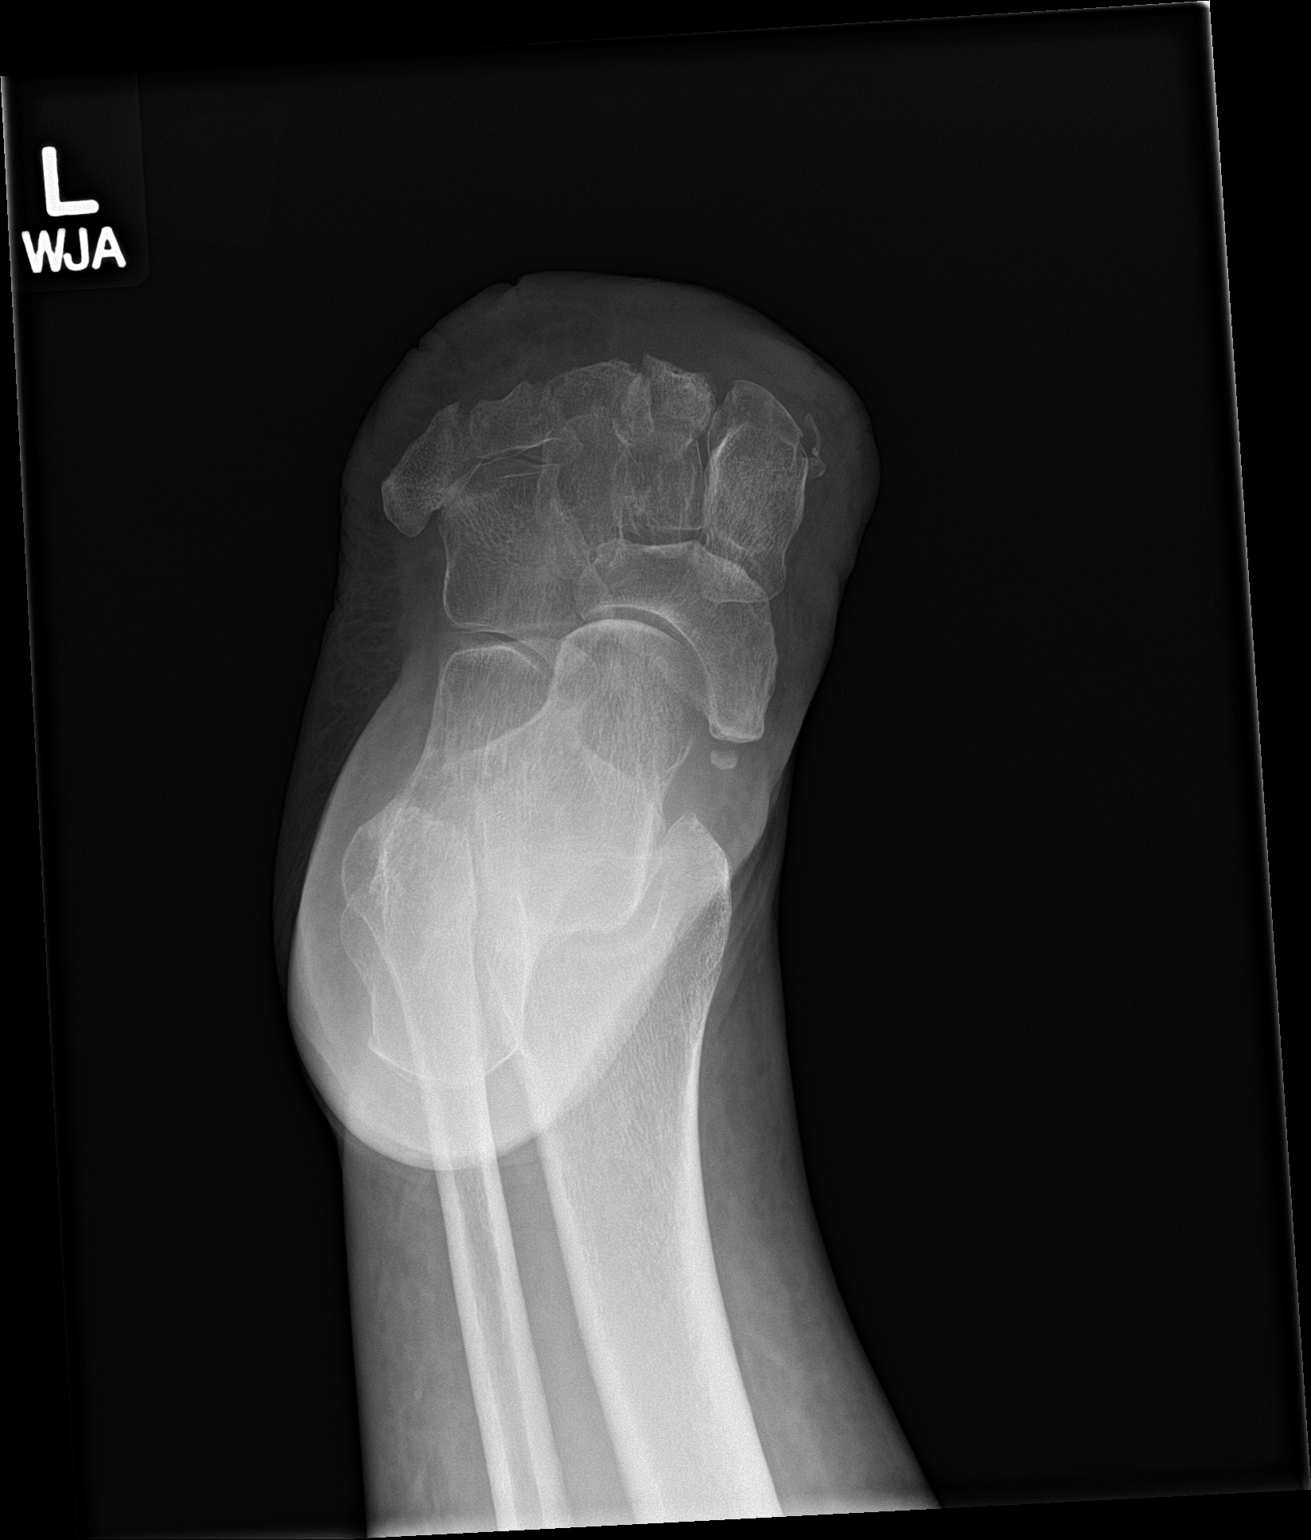

[foot obl]
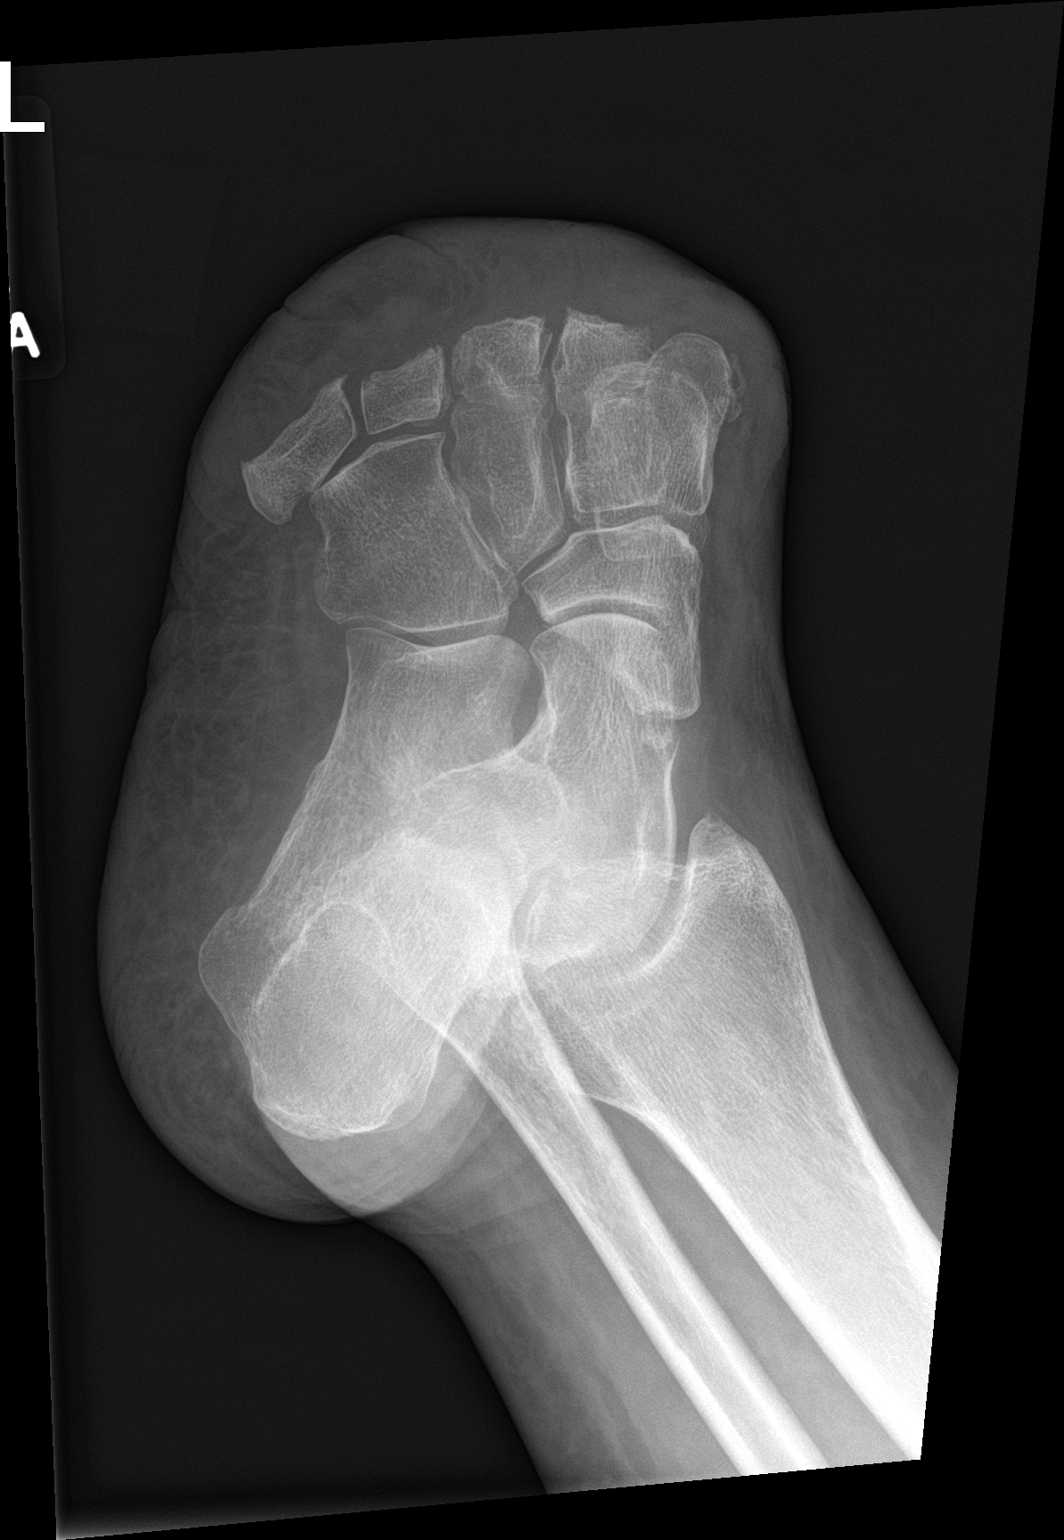

[foot lat]
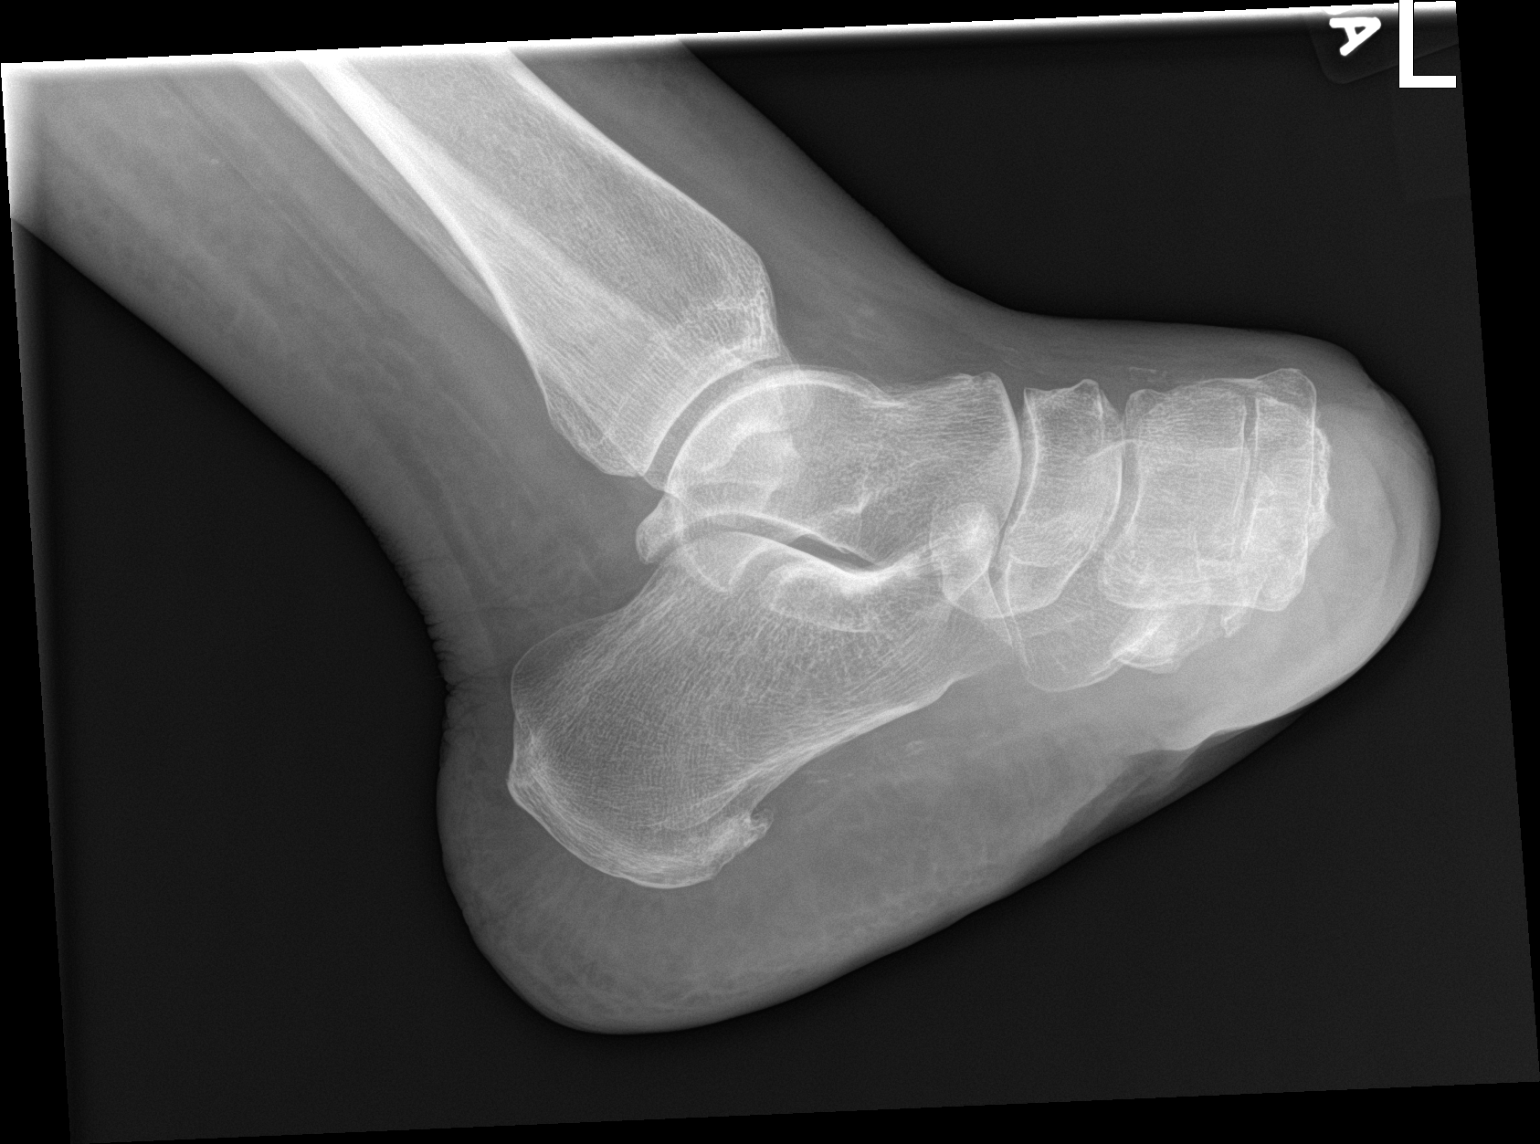

[3 of 3 positions shown; findings below may reference images not displayed]

FINDINGS: There is stable appearing transmetatarsal amputation of the left
foot. A small bone fragment noted adjacent to the base of the first
metatarsal similar to prior study. There is no acute fracture. No
dislocation. There is diffuse soft tissue thickening at the
amputation site with mild diffuse subcutaneous edema. No soft tissue
gas identified.
IMPRESSION: Stable appearing transmetatarsal amputation of the left foot. No
acute osseous pathology.

## 2016-12-26 IMAGING — CR DG ABD PORTABLE 1V
2 series · 2 of 2 positions shown · non-contrast
Comparison: CT abdomen and pelvis 05/12/2015

CLINICAL DATA: Abdominal pain and rectal bleeding.

EXAM:
PORTABLE ABDOMEN - 1 VIEW

[AP (1 of 2)]
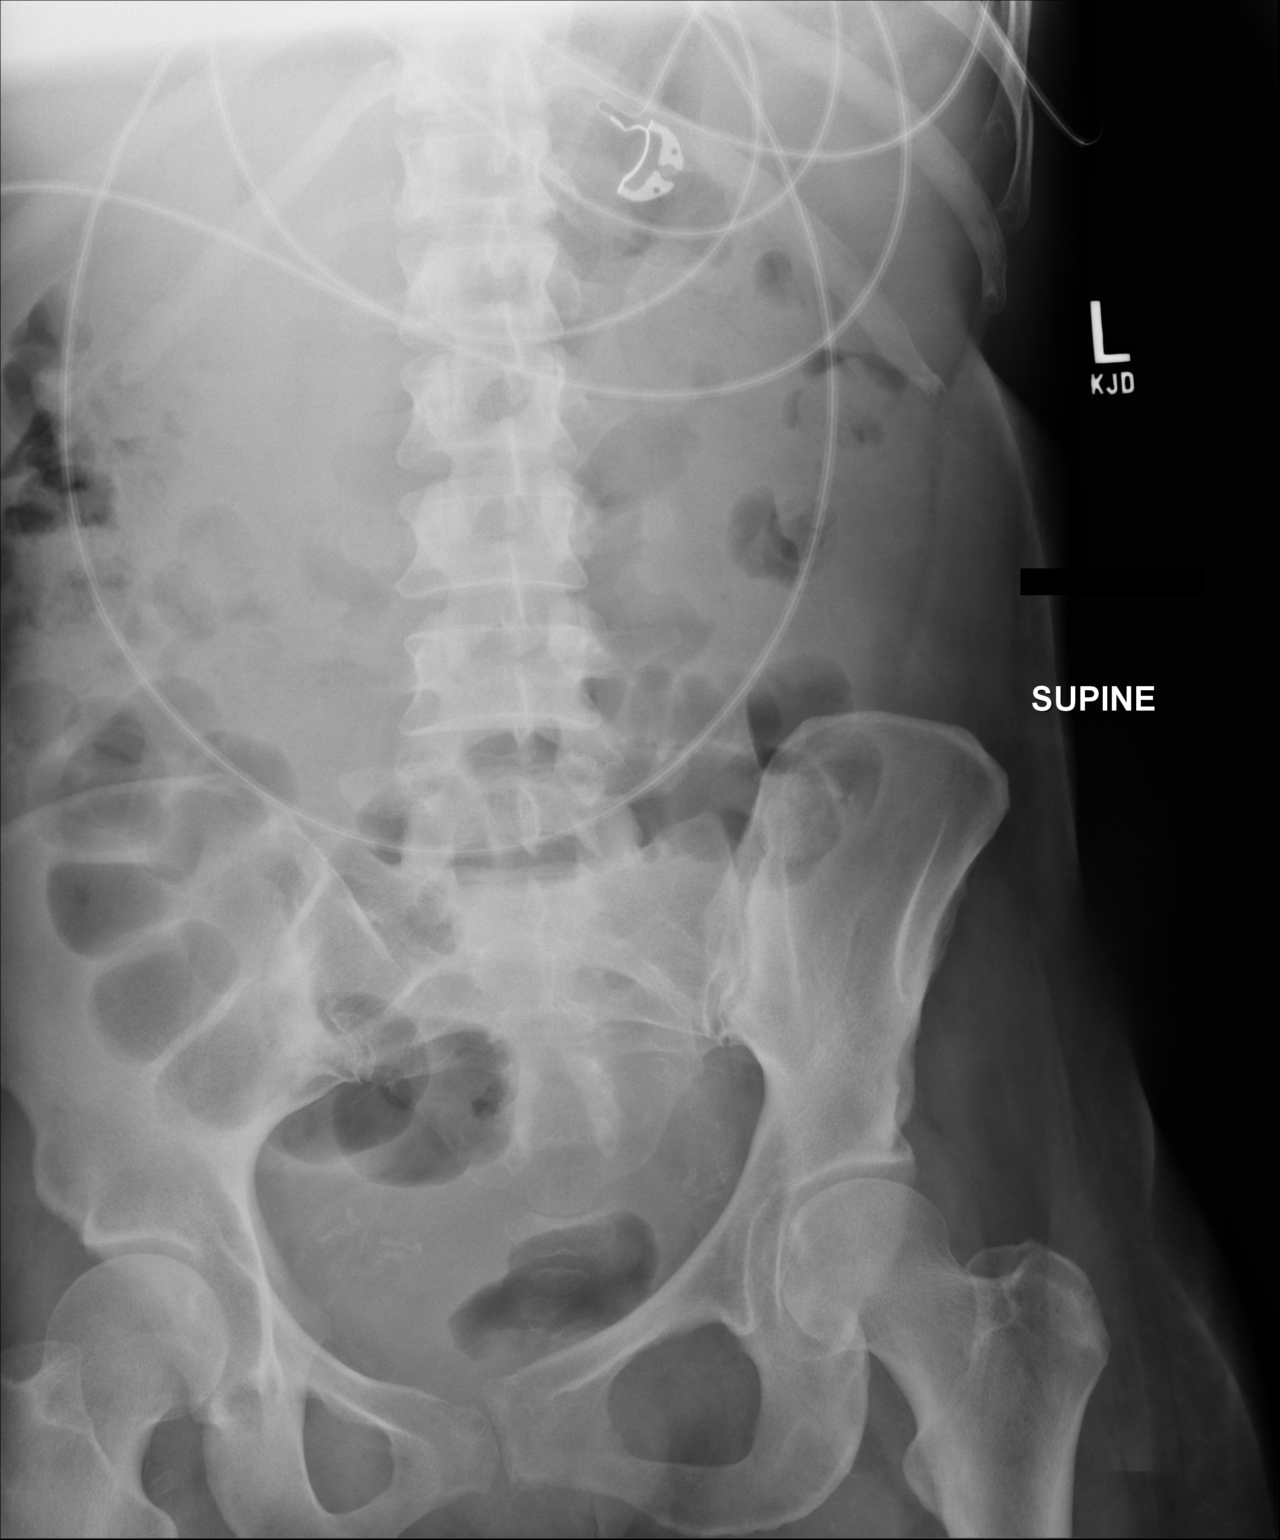

[AP (2 of 2)]
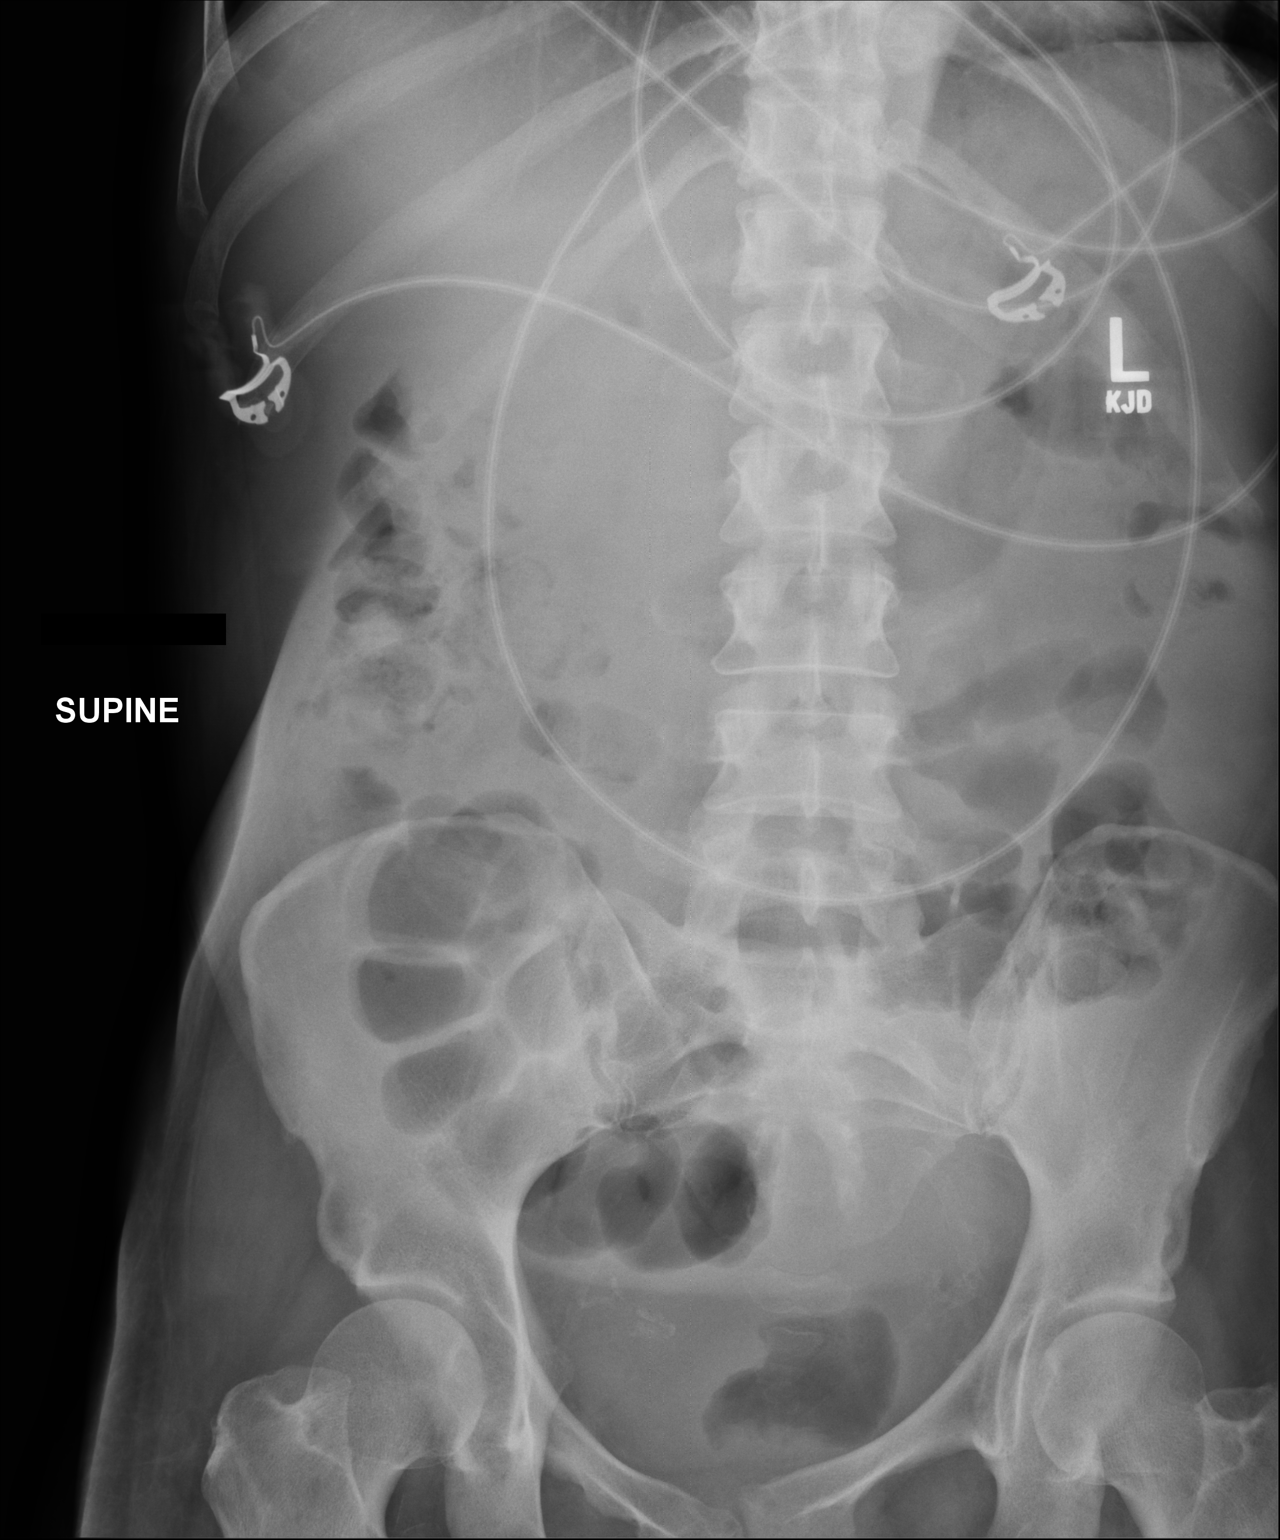

[2 of 2 positions shown; findings below may reference images not displayed]

FINDINGS: Scattered gas and stool throughout the colon. No small or large
bowel distention. Calcifications in the pelvis are likely vascular.
No radiopaque stones. Visualized bones appear intact.
IMPRESSION: Normal nonobstructive bowel gas pattern.

## 2016-12-26 IMAGING — CR DG CHEST 1V PORT
1 series · 1 of 1 positions shown · non-contrast
Comparison: Chest radiograph November 22, 2014

CLINICAL DATA: History of end-stage renal disease, missed multiple
dialysis appointments. History of diabetes, hypertension.

EXAM:
PORTABLE CHEST 1 VIEW

[AP]
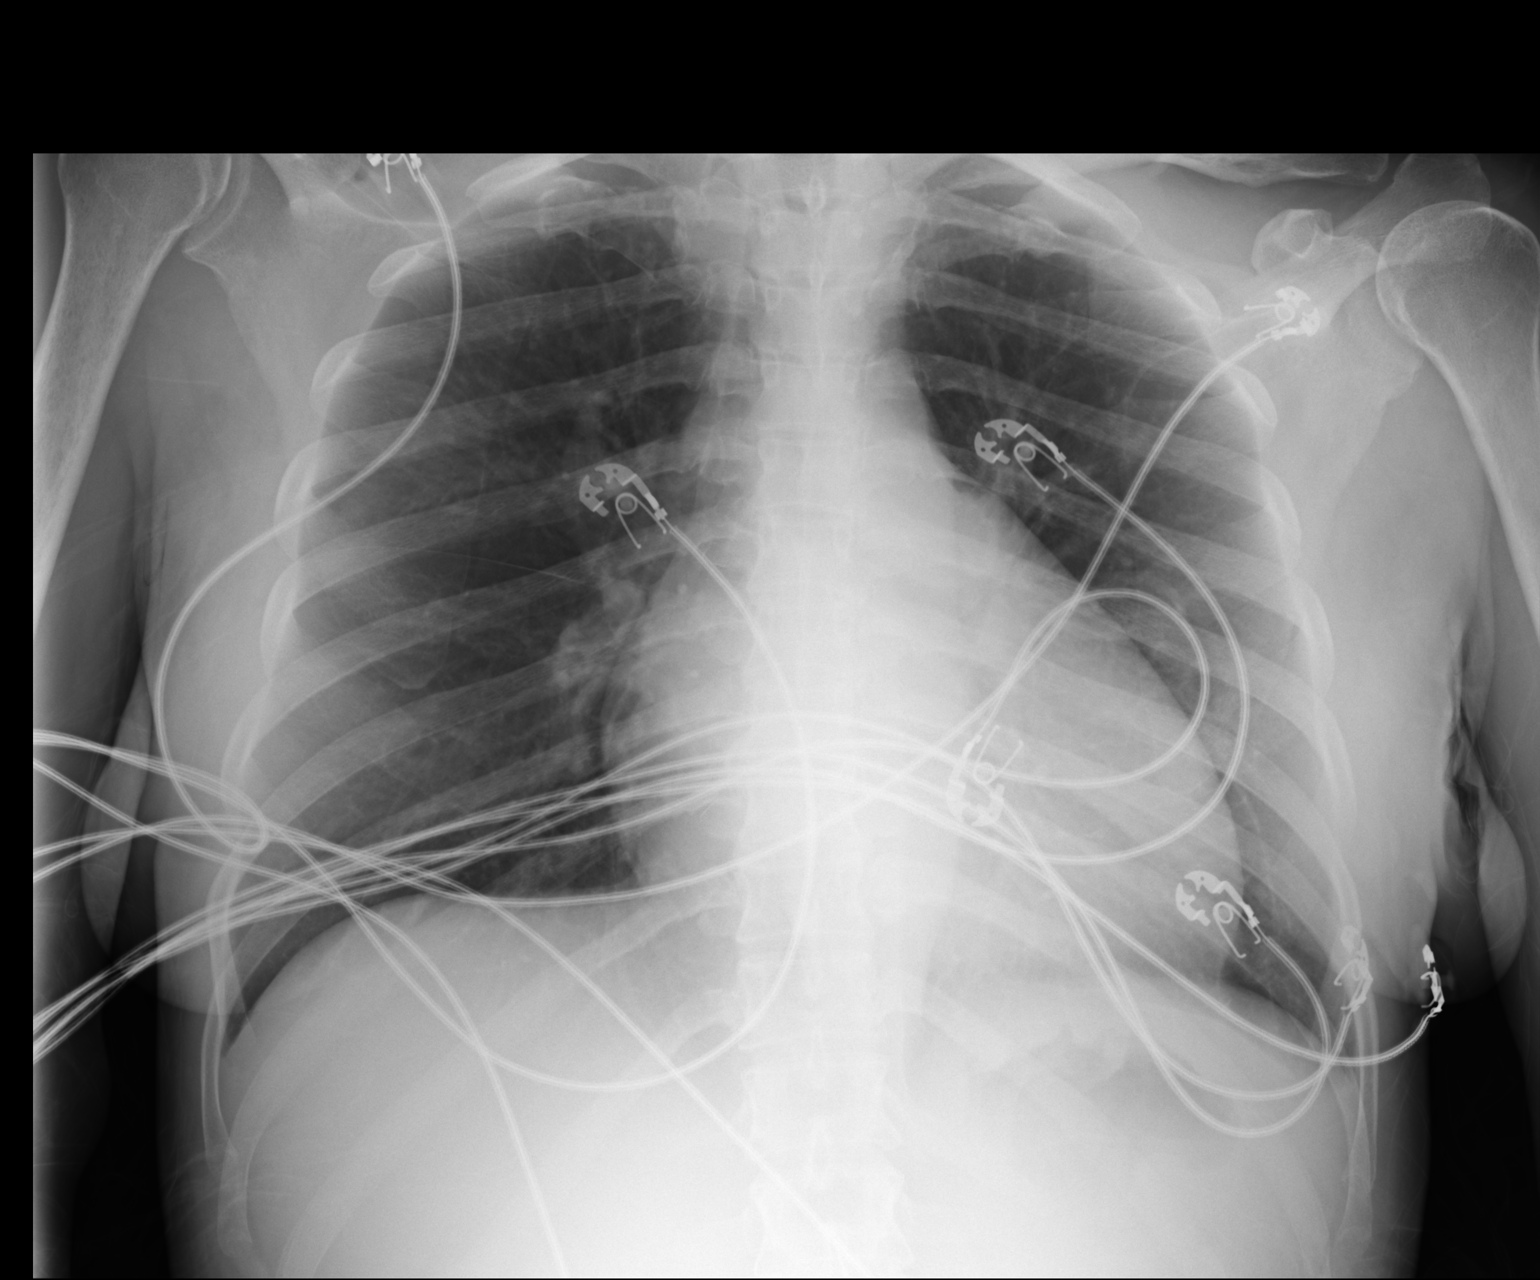

[1 of 1 positions shown; findings below may reference images not displayed]

FINDINGS: Cardiac silhouette is mildly enlarged, mediastinal silhouette is
unremarkable. Pulmonary vasculature appears normal. No pleural
effusion or focal consolidation. No pneumothorax. Soft tissue planes
and included osseous structures are nonsuspicious.
IMPRESSION: Mild cardiomegaly, no acute pulmonary process.

## 2016-12-31 ENCOUNTER — Emergency Department (HOSPITAL_COMMUNITY): Payer: Medicare Other

## 2016-12-31 ENCOUNTER — Inpatient Hospital Stay (HOSPITAL_COMMUNITY)
Admission: EM | Admit: 2016-12-31 | Discharge: 2017-01-02 | DRG: 602 | Disposition: A | Discharge status: Home / Self Care | Payer: Medicare Other | Attending: Family Medicine | Admitting: Family Medicine

## 2016-12-31 ENCOUNTER — Encounter (HOSPITAL_COMMUNITY): Payer: Self-pay

## 2016-12-31 DIAGNOSIS — Z880 Allergy status to penicillin: Secondary | ICD-10-CM

## 2016-12-31 DIAGNOSIS — L03116 Cellulitis of left lower limb: Secondary | ICD-10-CM | POA: Diagnosis not present

## 2016-12-31 DIAGNOSIS — Z8614 Personal history of Methicillin resistant Staphylococcus aureus infection: Secondary | ICD-10-CM

## 2016-12-31 DIAGNOSIS — M79605 Pain in left leg: Secondary | ICD-10-CM

## 2016-12-31 DIAGNOSIS — E119 Type 2 diabetes mellitus without complications: Secondary | ICD-10-CM

## 2016-12-31 DIAGNOSIS — M79652 Pain in left thigh: Secondary | ICD-10-CM

## 2016-12-31 DIAGNOSIS — E1165 Type 2 diabetes mellitus with hyperglycemia: Secondary | ICD-10-CM | POA: Diagnosis present

## 2016-12-31 DIAGNOSIS — G629 Polyneuropathy, unspecified: Secondary | ICD-10-CM | POA: Diagnosis present

## 2016-12-31 DIAGNOSIS — B1920 Unspecified viral hepatitis C without hepatic coma: Secondary | ICD-10-CM | POA: Diagnosis present

## 2016-12-31 DIAGNOSIS — E8770 Fluid overload, unspecified: Secondary | ICD-10-CM

## 2016-12-31 DIAGNOSIS — Z794 Long term (current) use of insulin: Secondary | ICD-10-CM

## 2016-12-31 DIAGNOSIS — R0902 Hypoxemia: Secondary | ICD-10-CM | POA: Diagnosis present

## 2016-12-31 DIAGNOSIS — Z8673 Personal history of transient ischemic attack (TIA), and cerebral infarction without residual deficits: Secondary | ICD-10-CM

## 2016-12-31 DIAGNOSIS — F419 Anxiety disorder, unspecified: Secondary | ICD-10-CM | POA: Diagnosis present

## 2016-12-31 DIAGNOSIS — E1122 Type 2 diabetes mellitus with diabetic chronic kidney disease: Secondary | ICD-10-CM | POA: Diagnosis present

## 2016-12-31 DIAGNOSIS — Z885 Allergy status to narcotic agent status: Secondary | ICD-10-CM

## 2016-12-31 DIAGNOSIS — M609 Myositis, unspecified: Secondary | ICD-10-CM | POA: Diagnosis present

## 2016-12-31 DIAGNOSIS — I5042 Chronic combined systolic (congestive) and diastolic (congestive) heart failure: Secondary | ICD-10-CM | POA: Diagnosis present

## 2016-12-31 DIAGNOSIS — F259 Schizoaffective disorder, unspecified: Secondary | ICD-10-CM | POA: Diagnosis present

## 2016-12-31 DIAGNOSIS — G894 Chronic pain syndrome: Secondary | ICD-10-CM | POA: Diagnosis present

## 2016-12-31 DIAGNOSIS — W19XXXA Unspecified fall, initial encounter: Secondary | ICD-10-CM

## 2016-12-31 DIAGNOSIS — L03115 Cellulitis of right lower limb: Secondary | ICD-10-CM

## 2016-12-31 DIAGNOSIS — D631 Anemia in chronic kidney disease: Secondary | ICD-10-CM | POA: Diagnosis present

## 2016-12-31 DIAGNOSIS — F1410 Cocaine abuse, uncomplicated: Secondary | ICD-10-CM | POA: Diagnosis present

## 2016-12-31 DIAGNOSIS — E1140 Type 2 diabetes mellitus with diabetic neuropathy, unspecified: Secondary | ICD-10-CM | POA: Diagnosis present

## 2016-12-31 DIAGNOSIS — N2581 Secondary hyperparathyroidism of renal origin: Secondary | ICD-10-CM | POA: Diagnosis present

## 2016-12-31 DIAGNOSIS — Z87891 Personal history of nicotine dependence: Secondary | ICD-10-CM

## 2016-12-31 DIAGNOSIS — J189 Pneumonia, unspecified organism: Secondary | ICD-10-CM

## 2016-12-31 DIAGNOSIS — R739 Hyperglycemia, unspecified: Secondary | ICD-10-CM

## 2016-12-31 DIAGNOSIS — A419 Sepsis, unspecified organism: Secondary | ICD-10-CM

## 2016-12-31 DIAGNOSIS — H409 Unspecified glaucoma: Secondary | ICD-10-CM | POA: Diagnosis present

## 2016-12-31 DIAGNOSIS — Z992 Dependence on renal dialysis: Secondary | ICD-10-CM

## 2016-12-31 DIAGNOSIS — I132 Hypertensive heart and chronic kidney disease with heart failure and with stage 5 chronic kidney disease, or end stage renal disease: Secondary | ICD-10-CM | POA: Diagnosis present

## 2016-12-31 DIAGNOSIS — Z9119 Patient's noncompliance with other medical treatment and regimen: Secondary | ICD-10-CM

## 2016-12-31 DIAGNOSIS — F319 Bipolar disorder, unspecified: Secondary | ICD-10-CM | POA: Diagnosis present

## 2016-12-31 DIAGNOSIS — Z79899 Other long term (current) drug therapy: Secondary | ICD-10-CM

## 2016-12-31 DIAGNOSIS — Z888 Allergy status to other drugs, medicaments and biological substances status: Secondary | ICD-10-CM

## 2016-12-31 DIAGNOSIS — E8889 Other specified metabolic disorders: Secondary | ICD-10-CM | POA: Diagnosis present

## 2016-12-31 DIAGNOSIS — N186 End stage renal disease: Secondary | ICD-10-CM | POA: Diagnosis present

## 2016-12-31 DIAGNOSIS — E7800 Pure hypercholesterolemia, unspecified: Secondary | ICD-10-CM | POA: Diagnosis present

## 2016-12-31 LAB — C-REACTIVE PROTEIN: CRP: 6.7 mg/dL — ABNORMAL HIGH (ref <1.0)

## 2016-12-31 LAB — COMPREHENSIVE METABOLIC PANEL
ALK PHOS: 120 U/L (ref 38–126)
ALT: 18 U/L (ref 14–54)
AST: 25 U/L (ref 15–41)
Albumin: 3 g/dL — ABNORMAL LOW (ref 3.5–5.0)
Anion gap: 18 — ABNORMAL HIGH (ref 5–15)
BUN: 41 mg/dL — ABNORMAL HIGH (ref 6–20)
CALCIUM: 8.8 mg/dL — AB (ref 8.9–10.3)
CHLORIDE: 81 mmol/L — AB (ref 101–111)
CO2: 29 mmol/L (ref 22–32)
CREATININE: 7.77 mg/dL — AB (ref 0.44–1.00)
GFR, EST AFRICAN AMERICAN: 6 mL/min — AB (ref >60)
GFR, EST NON AFRICAN AMERICAN: 5 mL/min — AB (ref >60)
Glucose, Bld: 509 mg/dL (ref 65–99)
Potassium: 4.6 mmol/L (ref 3.5–5.1)
SODIUM: 128 mmol/L — AB (ref 135–145)
Total Bilirubin: 0.9 mg/dL (ref 0.3–1.2)
Total Protein: 7.6 g/dL (ref 6.5–8.1)

## 2016-12-31 LAB — CK: Total CK: 295 U/L — ABNORMAL HIGH (ref 38–234)

## 2016-12-31 LAB — SEDIMENTATION RATE: SED RATE: 40 mm/h — AB (ref 0–22)

## 2016-12-31 LAB — CBC WITH DIFFERENTIAL/PLATELET
BASOS ABS: 0 10*3/uL (ref 0.0–0.1)
Basophils Relative: 1 %
Eosinophils Absolute: 0.1 10*3/uL (ref 0.0–0.7)
Eosinophils Relative: 2 %
HCT: 36.2 % (ref 36.0–46.0)
HEMOGLOBIN: 12 g/dL (ref 12.0–15.0)
LYMPHS ABS: 0.9 10*3/uL (ref 0.7–4.0)
LYMPHS PCT: 27 %
MCH: 28.4 pg (ref 26.0–34.0)
MCHC: 33.1 g/dL (ref 30.0–36.0)
MCV: 85.8 fL (ref 78.0–100.0)
Monocytes Absolute: 0.3 10*3/uL (ref 0.1–1.0)
Monocytes Relative: 9 %
NEUTROS PCT: 61 %
Neutro Abs: 2 10*3/uL (ref 1.7–7.7)
PLATELETS: 311 10*3/uL (ref 150–400)
RBC: 4.22 MIL/uL (ref 3.87–5.11)
RDW: 16.3 % — ABNORMAL HIGH (ref 11.5–15.5)
WBC: 3.2 10*3/uL — AB (ref 4.0–10.5)

## 2016-12-31 LAB — LIPASE, BLOOD: Lipase: 29 U/L (ref 11–51)

## 2016-12-31 MED ORDER — ONDANSETRON 4 MG PO TBDP
4.0000 mg | ORAL_TABLET | Freq: Once | ORAL | Status: AC
  Administered 2016-12-31: 4 mg via ORAL
  Filled 2016-12-31: qty 1

## 2016-12-31 MED ORDER — DEXTROSE 5 % IV SOLN
2.0000 g | Freq: Once | INTRAVENOUS | Status: AC
  Administered 2017-01-01: 2 g via INTRAVENOUS
  Filled 2016-12-31: qty 2

## 2016-12-31 MED ORDER — HYDROCODONE-ACETAMINOPHEN 5-325 MG PO TABS
1.0000 | ORAL_TABLET | Freq: Once | ORAL | Status: AC
  Administered 2016-12-31: 1 via ORAL
  Filled 2016-12-31: qty 1

## 2016-12-31 MED ORDER — CLINDAMYCIN PHOSPHATE 600 MG/50ML IV SOLN
600.0000 mg | Freq: Once | INTRAVENOUS | Status: AC
  Administered 2017-01-01: 600 mg via INTRAVENOUS
  Filled 2016-12-31: qty 50

## 2016-12-31 MED ORDER — VANCOMYCIN HCL 10 G IV SOLR
1500.0000 mg | Freq: Once | INTRAVENOUS | Status: AC
  Administered 2017-01-01: 1500 mg via INTRAVENOUS
  Filled 2016-12-31: qty 1500

## 2016-12-31 NOTE — ED Provider Notes (Signed)
[place] DEPT Provider Note   CSN: [id number] Arrival date & time: 12/31/16  1755     History   Chief Complaint Chief Complaint  Patient presents with  . Leg Pain    HPI [name] is a 51 y.o. female.  The history is provided by the patient.  Leg Pain   The current episode started 2 days ago. The problem occurs daily. The problem has been gradually worsening. The pain is present in the left upper leg. The quality of the pain is described as aching and dull (Patient states that left thing with increasing pain and swelling over the last few days that is worse with walking. Hx of DM and ESRD and missed dialysis today.). The pain is at a severity of 6/10. The pain is moderate. Pertinent negatives include no numbness, full range of motion, no stiffness and no tingling. The symptoms are aggravated by activity. She has tried nothing for the symptoms. The treatment provided no relief. There has been no history of extremity trauma. Family history is significant for no rheumatoid arthritis and no gout.    Past Medical History:  Diagnosis Date  . Anemia   . Anxiety   . Bipolar 1 disorder ([place])   . Bipolar disorder, unspecified ([place]) 02/06/2014  . Chronic combined systolic and diastolic CHF (congestive heart failure) (HCC)    EF 40% by echo and 48% by stress test  . Chronic pain syndrome   . Depression   . ESRD on hemodialysis ([place])   . GERD (gastroesophageal reflux disease)   . Glaucoma   . Headache    sinus headaches  . Hepatitis C   . High cholesterol   . History of hiatal hernia   . History of vitamin D deficiency 06/20/2015  . Hypertension   . MRSA infection ?2014   "on the back of my head; spread throughout my bloodstream"  . Neuropathy   . Refusal of blood transfusions as patient is Jehovah's Witness   . Schizoaffective disorder, unspecified condition 02/08/2014  . Seizure [facility]) dx'd 2014   "don't know what kind; last one was ~ 04/2014"  . Stroke [facility])    TIA 2009    . TIA (transient ischemic attack) 2010  . Type II diabetes mellitus ([place])    INSULIN DEPENDENT    Patient Active Problem List   Diagnosis Date Noted  . Cocaine use disorder, severe, dependence ([place]) 08/29/2016  . Hepatitis C 08/29/2016  . Diabetic ulcer of calf ([place]) 08/29/2016  . Diabetic ulcer of toe ([place]) 08/29/2016  . Cerebrovascular accident (CVA) ([place]) 08/29/2016  . MDD (major depressive disorder) 08/28/2016  . Dyslipidemia associated with type 2 diabetes mellitus ([place]) 11/10/2015  . History of vitamin D deficiency 06/20/2015  . Long term prescription opiate use 06/20/2015  . Chronic pain syndrome 06/19/2015  . Anemia of chronic kidney failure 05/01/2015  . transmetatarsal amputation of the left foot 12/13/2014  . Unilateral visual loss 11/22/2014  . 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 ([place]) 11/22/2014  . Chronic combined systolic and diastolic CHF (congestive heart failure) ([place])   . Anemia, iron deficiency 04/13/2014  . Noncompliance 02/02/2014  . CKD (chronic kidney disease) stage 5, GFR less than 15 ml/min (HCC) 05/18/2013  . Seizure disorder ([place]) 02/09/2013    Past Surgical History:  Procedure Laterality Date  . ABDOMINAL HYSTERECTOMY  2014  . ABDOMINAL HYSTERECTOMY  2010  . AMPUTATION Left 05/21/2013   Procedure: Left Midfoot AMPUTATION;  Surgeon: Newt Minion,  MD;  Location: [facility];  Service: Orthopedics;  Laterality: Left;  Left Midfoot Amputation  . AV FISTULA PLACEMENT Left 05/04/2015   Procedure: ARTERIOVENOUS (AV) GRAFT INSERTION;  Surgeon: Angelia Mould, MD;  Location: [facility];  Service: Vascular;  Laterality: Left;  . COLONOSCOPY WITH PROPOFOL N/A 06/22/2013   Procedure: COLONOSCOPY WITH PROPOFOL;  Surgeon: Jeryl Columbia, MD;  Location: [facility] ENDOSCOPY;  Service: Endoscopy;  Laterality: N/A;  . ESOPHAGOGASTRODUODENOSCOPY N/A 05/11/2015   Procedure: ESOPHAGOGASTRODUODENOSCOPY (EGD);  Surgeon: Laurence Spates, MD;  Location: [facility] ENDOSCOPY;  Service:  Endoscopy;  Laterality: N/A;  . EYE SURGERY Bilateral 2010   Lasik  . FOOT AMPUTATION Left    due diabetic neuropathy, could not feel ulcer on bottom of foot  . REFRACTIVE SURGERY Bilateral 2013  . TONSILLECTOMY  1970's  . TUBAL LIGATION    . TUBAL LIGATION  2000    OB History    Gravida Para Term Preterm AB Living   0 0 0 0 0     SAB TAB Ectopic Multiple Live Births   0 0 0           Home Medications    Prior to Admission medications   Medication Sig Start Date End Date Taking? Authorizing Provider  albuterol (PROVENTIL HFA;VENTOLIN HFA) 108 (90 Base) MCG/ACT inhaler Inhale 2 puffs into the lungs every 4 (four) hours as needed for wheezing or shortness of breath. 10/15/16  Yes Horton, Barbette Hair, MD  amLODipine (NORVASC) 10 MG tablet Take 1 tablet (10 mg total) by mouth at bedtime. 09/02/16  Yes Hildred Priest, MD  atorvastatin (LIPITOR) 40 MG tablet Take 1 tablet (40 mg total) by mouth at bedtime. 09/02/16  Yes Hildred Priest, MD  calcium acetate (PHOSLO) 667 MG capsule Take 2 capsules (1,334 mg total) by mouth 3 (three) times daily with meals. 09/02/16  Yes Hildred Priest, MD  carvedilol (COREG) 25 MG tablet Take 1 tablet (25 mg total) by mouth 2 (two) times daily with a meal. 09/02/16  Yes Hildred Priest, MD  cloNIDine (CATAPRES) 0.1 MG tablet Take 1 tablet (0.1 mg total) by mouth every 8 (eight) hours. 09/02/16  Yes Hildred Priest, MD  doxercalciferol (HECTOROL) 4 MCG/2ML injection Inject 0.5 mLs (1 mcg total) into the vein Every Tuesday,Thursday,and Saturday with dialysis. 09/03/16  Yes Hildred Priest, MD  furosemide (LASIX) 80 MG tablet Take 1 tablet (80 mg total) by mouth 2 (two) times daily. 09/02/16  Yes Hildred Priest, MD  haloperidol (HALDOL) 0.5 MG tablet Take 1 tablet (0.5 mg total) by mouth every 8 (eight) hours as needed for agitation. 09/03/16  Yes Hildred Priest, MD  insulin  aspart (NOVOLOG) 100 UNIT/ML injection Inject 5 Units into the skin 3 (three) times daily with meals. 09/03/16  Yes Hildred Priest, MD  insulin glargine (LANTUS) 100 UNIT/ML injection Inject 0.22 mLs (22 Units total) into the skin at bedtime. 09/03/16  Yes Hildred Priest, MD  lidocaine-prilocaine (EMLA) cream Apply 1 application topically See admin instructions. Apply to dialysis site one hour prior to dialysis   Yes [provider]  LORazepam (ATIVAN) 2 MG/ML injection Inject 1 mL (2 mg total) into the vein Every Tuesday,Thursday,and Saturday with dialysis. 09/03/16  Yes Hildred Priest, MD  multivitamin (RENA-VIT) TABS tablet Take 1 tablet by mouth at bedtime. 09/02/16  Yes Hildred Priest, MD  sertraline (ZOLOFT) 50 MG tablet Take 1 tablet (50 mg total) by mouth daily. 09/02/16  Yes Hildred Priest, MD  traZODone (DESYREL) 50 MG tablet Take  0.5 tablets (25 mg total) by mouth at bedtime. 09/02/16  Yes Hildred Priest, MD    Family History Family History  Problem Relation Age of Onset  . Hypertension Mother   . Hypertension Father     Social History Social History  Substance Use Topics  . Smoking status: Former Smoker    Packs/day: 1.00    Years: 20.00    Types: Cigarettes    Quit date: 03/31/2015  . Smokeless tobacco: Never Used     Comment: "stopped smoking in ~ 2013; restarted last week; stopped again 06/17/2014"  . Alcohol use 1.8 oz/week    3 Standard drinks or equivalent per week     Comment: 05/01/15 patient denies alcohol use.     Allergies   Penicillins; Gabapentin; Lyrica [pregabalin]; Saphris [asenapine]; Tramadol; and Other   Review of Systems Review of Systems  Constitutional: Negative for chills and fever.  HENT: Negative for ear pain and sore throat.   Eyes: Negative for pain and visual disturbance.  Respiratory: Negative for cough and shortness of breath.   Cardiovascular: Negative for  chest pain and palpitations.  Gastrointestinal: Negative for abdominal pain and vomiting.  Genitourinary: Negative for dysuria and hematuria.  Musculoskeletal: Positive for gait problem. Negative for arthralgias, back pain and stiffness.  Skin: Negative for color change and rash.  Neurological: Negative for tingling, seizures, syncope and numbness.  All other systems reviewed and are negative.    Physical Exam Updated Vital Signs  [name]   Physical Exam  Constitutional: She is oriented to person, place, and time. She appears well-developed and well-nourished. No distress.  HENT:  Head: Normocephalic and atraumatic.  Eyes: Conjunctivae and EOM are normal. Pupils are equal, round, and reactive to light.  Neck: Normal range of motion. Neck supple.  Cardiovascular: Normal rate, regular rhythm, normal heart sounds and intact distal pulses.   No murmur heard. Pulmonary/Chest: Effort normal and breath sounds normal. No respiratory distress.  Abdominal: Soft. Bowel sounds are normal. She exhibits no distension. There is no tenderness.  Musculoskeletal: She exhibits no edema.  Neurological: She is alert and oriented to person, place, and time.  Skin: Skin is warm and dry.  Left thigh swelling greater than right and tender to palpation, no crepitus  Psychiatric: She has a normal mood and affect.  Nursing note and vitals reviewed.    ED Treatments / Results  Labs (all labs ordered are listed, but only abnormal results are displayed) Labs Reviewed  CBC WITH DIFFERENTIAL/PLATELET - Abnormal; Notable for the following:       Result Value   WBC 3.2 (*)    RDW 16.3 (*)    All other components within normal limits  COMPREHENSIVE METABOLIC PANEL - Abnormal; Notable for the following:    Sodium 128 (*)    Chloride 81 (*)    Glucose, Bld 509 (*)    BUN 41 (*)    Creatinine, Ser 7.77 (*)    Calcium 8.8 (*)    Albumin 3.0 (*)    GFR calc non Af Amer 5 (*)    GFR calc Af Amer 6 (*)     Anion gap 18 (*)    All other components within normal limits  SEDIMENTATION RATE - Abnormal; Notable for the following:    Sed Rate 40 (*)    All other components within normal limits  C-REACTIVE PROTEIN - Abnormal; Notable for the following:    CRP 6.7 (*)    All other components  within normal limits  CK - Abnormal; Notable for the following:    Total CK 295 (*)    All other components within normal limits  I-STAT VENOUS BLOOD GAS, ED - Abnormal; Notable for the following:    pH, Ven 7.514 (*)    pCO2, Ven 40.2 (*)    pO2, Ven 61.0 (*)    Bicarbonate 32.4 (*)    Acid-Base Excess 9.0 (*)    All other components within normal limits  CULTURE, BLOOD (ROUTINE X 2)  CULTURE, BLOOD (ROUTINE X 2)  URINE CULTURE  LIPASE, BLOOD  BRAIN NATRIURETIC PEPTIDE  URINALYSIS, ROUTINE W REFLEX MICROSCOPIC  MAGNESIUM  PHOSPHORUS  RAPID URINE DRUG SCREEN, HOSP PERFORMED  RAPID URINE DRUG SCREEN, HOSP PERFORMED  CBG MONITORING, ED  I-STAT TROPOININ, ED  I-STAT CG4 LACTIC ACID, ED    EKG  EKG Interpretation None       Radiology Dg Chest Portable 1 View  Result Date: 12/31/2016 CLINICAL DATA:  Acute onset of hypoxia.  Initial encounter. EXAM: PORTABLE CHEST 1 VIEW COMPARISON:  Chest radiograph performed 10/15/2016 FINDINGS: The lungs are well-aerated. Vascular congestion is noted. Hazy right basilar airspace opacity may reflect mild interstitial edema or possibly pneumonia. There is no evidence of pleural effusion or pneumothorax. The cardiomediastinal silhouette is mildly enlarged. No acute osseous abnormalities are seen. IMPRESSION: Vascular congestion and mild cardiomegaly. Hazy right basilar airspace opacity may reflect mild interstitial edema or possibly pneumonia. Electronically Signed   By: Garald Balding M.D.   On: 12/31/2016 23:46   Dg Femur Min 2 Views Left  Result Date: 12/31/2016 CLINICAL DATA:  Initial evaluation for recent fall, pain. EXAM: LEFT FEMUR 2 VIEWS COMPARISON:   None. FINDINGS: No acute fracture or dislocation. Limited views of the hip and knee are grossly unremarkable. No acute soft tissue abnormality. Prominent vascular calcifications noted. IMPRESSION: 1. No acute fracture or dislocation. 2. Prominent soft tissue atherosclerotic changes for patient age. Electronically Signed   By: Jeannine Boga M.D.   On: 12/31/2016 19:02    Procedures Procedures (including critical care time)  Medications Ordered in ED Medications  vancomycin (VANCOCIN) 1,500 mg in sodium chloride 0.9 % 500 mL IVPB (not administered)  ceFEPIme (MAXIPIME) 2 g in dextrose 5 % 50 mL IVPB (not administered)  amLODipine (NORVASC) tablet 10 mg (not administered)  atorvastatin (LIPITOR) tablet 40 mg (not administered)  calcium acetate (PHOSLO) capsule 1,334 mg (not administered)  carvedilol (COREG) tablet 25 mg (not administered)  cloNIDine (CATAPRES) tablet 0.1 mg (not administered)  furosemide (LASIX) tablet 80 mg (not administered)  haloperidol (HALDOL) tablet 0.5 mg (not administered)  insulin glargine (LANTUS) injection 22 Units (not administered)  LORazepam (ATIVAN) injection 2 mg (not administered)  sertraline (ZOLOFT) tablet 50 mg (not administered)  multivitamin (RENA-VIT) tablet 1 tablet (not administered)  traZODone (DESYREL) tablet 25 mg (not administered)  doxercalciferol (HECTOROL) injection 1 mcg (not administered)  albuterol (PROVENTIL) (2.5 MG/3ML) 0.083% nebulizer solution 2.5 mg (not administered)  insulin aspart (novoLOG) injection 0-9 Units (not administered)  HYDROcodone-acetaminophen (NORCO/VICODIN) 5-325 MG per tablet 1 tablet (1 tablet Oral Given 12/31/16 2248)  ondansetron (ZOFRAN-ODT) disintegrating tablet 4 mg (4 mg Oral Given 12/31/16 2247)  clindamycin (CLEOCIN) IVPB 600 mg (600 mg Intravenous New Bag/Given 01/01/17 0002)     Initial Impression / Assessment and Plan / ED Course  I have reviewed the triage vital signs and the nursing  notes.  Pertinent labs & imaging results that were available during my care of the  patient were reviewed by me and considered in my medical decision making (see chart for details).     [name] is a 51 year old female with end-stage renal failure on hemodialysis, diabetes, heart failure, seizure, stroke, high cholesterol who presents to the ED with left thigh pain. Patient's vitals at time of arrival to the ED are significant for hypertension and tachycardia but otherwise are unremarkable and patient is without fever. Patient states that she has had left thigh pain for the last 2-3 days that is worse with ambulation. Patient skipped dialysis today secondary to the pain. Patient denies chest pain, shortness of breath, abdominal pain. Patient states that she has had some cough and sputum production as well. Patient states that she has felt feverish. Patient denies any history of DVT. On exam, patient with diffuse tenderness in the left thigh. Patient has some edema in the left upper leg but not in the lower leg. This swelling is greater than the right. No obvious crepitus. No obvious overlying cellulitis. Patient with clear breath sounds bilaterally. Concern for possible abscess versus myositis of left thigh. Low concern for necrotizing facititis given gradual onset. To evaluate will get CBC, CMP, lipase, ESR, CRP, x-ray of left thigh. Patient given Norco for pain.  Patient was reevaluated and now has temperature of 100.4. Sepsis order set placed with urine studies, chest x-ray, blood cultures. Lactic acid also ordered. Patient empirically started on IV clindamycin, IV vancomycin, IV cefepime. Source likely from leg. Patient had elevation of glucose to 509 with anion gap elevated to 18 however bicarbonate of 29 and doubt DKA. PH was also 7.5. Patient with no leukocytosis but elevation in sedimentation rate, CRP, CK. Lactic acid within normal limits. Fluids were held due to concern for volume overload.  Patient had chest x-ray that showed possible pneumonia versus pulmonary edema and given fever and cough and sputum production possible pneumonia. Blood cultures pending, urinalysis pending an MRI of left thigh pending at time of transfer to care to medicine service. Concern for myositis versus abscess of left thigh versus possible pneumonia versus possible urinary tract infection. Patient hemodynamically stable at time of transfer to medicine service.  Final Clinical Impressions(s) / ED Diagnoses   Final diagnoses:  Left thigh pain  Left leg pain  Sepsis, due to unspecified organism [facility])  Community acquired pneumonia, unspecified laterality  Hyperglycemia  Hypervolemia, unspecified hypervolemia type  Hypoxia    New Prescriptions New Prescriptions   No medications on file     Lennice Sites, DO 01/01/17 0044    Quintella Reichert, MD 01/01/17 1438

## 2016-12-31 NOTE — ED Triage Notes (Signed)
Pt states she has had left leg pain X3-4 days. She does report a fall. Left thigh appears more swollen in comparison to the right thigh. Pt states she was unable to walk PTA.

## 2016-12-31 NOTE — ED Notes (Signed)
Date and time results received: 12/31/16   Test: Glucose  Critical Value: 509

## 2016-12-31 NOTE — ED Notes (Signed)
Pt SP02 consistently remains in the 80's, I placed pt on 2L via nasal cannula.

## 2017-01-01 ENCOUNTER — Other Ambulatory Visit: Payer: Self-pay

## 2017-01-01 ENCOUNTER — Observation Stay (HOSPITAL_COMMUNITY): Payer: Medicare Other

## 2017-01-01 DIAGNOSIS — L02419 Cutaneous abscess of limb, unspecified: Secondary | ICD-10-CM | POA: Diagnosis not present

## 2017-01-01 DIAGNOSIS — Z8673 Personal history of transient ischemic attack (TIA), and cerebral infarction without residual deficits: Secondary | ICD-10-CM | POA: Diagnosis not present

## 2017-01-01 DIAGNOSIS — Z79899 Other long term (current) drug therapy: Secondary | ICD-10-CM | POA: Diagnosis not present

## 2017-01-01 DIAGNOSIS — G894 Chronic pain syndrome: Secondary | ICD-10-CM

## 2017-01-01 DIAGNOSIS — N2581 Secondary hyperparathyroidism of renal origin: Secondary | ICD-10-CM | POA: Diagnosis present

## 2017-01-01 DIAGNOSIS — I132 Hypertensive heart and chronic kidney disease with heart failure and with stage 5 chronic kidney disease, or end stage renal disease: Secondary | ICD-10-CM | POA: Diagnosis present

## 2017-01-01 DIAGNOSIS — Z888 Allergy status to other drugs, medicaments and biological substances status: Secondary | ICD-10-CM | POA: Diagnosis not present

## 2017-01-01 DIAGNOSIS — Z992 Dependence on renal dialysis: Secondary | ICD-10-CM

## 2017-01-01 DIAGNOSIS — E1165 Type 2 diabetes mellitus with hyperglycemia: Secondary | ICD-10-CM | POA: Diagnosis present

## 2017-01-01 DIAGNOSIS — L03116 Cellulitis of left lower limb: Secondary | ICD-10-CM | POA: Diagnosis present

## 2017-01-01 DIAGNOSIS — E1122 Type 2 diabetes mellitus with diabetic chronic kidney disease: Secondary | ICD-10-CM | POA: Diagnosis present

## 2017-01-01 DIAGNOSIS — N186 End stage renal disease: Secondary | ICD-10-CM | POA: Diagnosis present

## 2017-01-01 DIAGNOSIS — E119 Type 2 diabetes mellitus without complications: Secondary | ICD-10-CM

## 2017-01-01 DIAGNOSIS — F1410 Cocaine abuse, uncomplicated: Secondary | ICD-10-CM | POA: Diagnosis present

## 2017-01-01 DIAGNOSIS — Z87891 Personal history of nicotine dependence: Secondary | ICD-10-CM | POA: Diagnosis not present

## 2017-01-01 DIAGNOSIS — L03119 Cellulitis of unspecified part of limb: Secondary | ICD-10-CM | POA: Diagnosis not present

## 2017-01-01 DIAGNOSIS — E1140 Type 2 diabetes mellitus with diabetic neuropathy, unspecified: Secondary | ICD-10-CM | POA: Diagnosis present

## 2017-01-01 DIAGNOSIS — Z794 Long term (current) use of insulin: Secondary | ICD-10-CM | POA: Diagnosis not present

## 2017-01-01 DIAGNOSIS — I5043 Acute on chronic combined systolic (congestive) and diastolic (congestive) heart failure: Secondary | ICD-10-CM | POA: Diagnosis not present

## 2017-01-01 DIAGNOSIS — E8889 Other specified metabolic disorders: Secondary | ICD-10-CM | POA: Diagnosis present

## 2017-01-01 DIAGNOSIS — Z880 Allergy status to penicillin: Secondary | ICD-10-CM | POA: Diagnosis not present

## 2017-01-01 DIAGNOSIS — D631 Anemia in chronic kidney disease: Secondary | ICD-10-CM | POA: Diagnosis present

## 2017-01-01 DIAGNOSIS — F259 Schizoaffective disorder, unspecified: Secondary | ICD-10-CM | POA: Diagnosis present

## 2017-01-01 DIAGNOSIS — M609 Myositis, unspecified: Secondary | ICD-10-CM | POA: Diagnosis present

## 2017-01-01 DIAGNOSIS — I509 Heart failure, unspecified: Secondary | ICD-10-CM | POA: Diagnosis not present

## 2017-01-01 DIAGNOSIS — B1920 Unspecified viral hepatitis C without hepatic coma: Secondary | ICD-10-CM | POA: Diagnosis present

## 2017-01-01 DIAGNOSIS — G629 Polyneuropathy, unspecified: Secondary | ICD-10-CM | POA: Diagnosis present

## 2017-01-01 DIAGNOSIS — I5042 Chronic combined systolic (congestive) and diastolic (congestive) heart failure: Secondary | ICD-10-CM | POA: Diagnosis present

## 2017-01-01 DIAGNOSIS — Z885 Allergy status to narcotic agent status: Secondary | ICD-10-CM | POA: Diagnosis not present

## 2017-01-01 LAB — URINALYSIS, ROUTINE W REFLEX MICROSCOPIC
BILIRUBIN URINE: NEGATIVE
Bacteria, UA: NONE SEEN
KETONES UR: NEGATIVE mg/dL
LEUKOCYTES UA: NEGATIVE
NITRITE: NEGATIVE
PH: 8 (ref 5.0–8.0)
Protein, ur: >=300 mg/dL — AB
SPECIFIC GRAVITY, URINE: 1.013 (ref 1.005–1.030)

## 2017-01-01 LAB — GLUCOSE, CAPILLARY
GLUCOSE-CAPILLARY: 412 mg/dL — AB (ref 65–99)
GLUCOSE-CAPILLARY: 371 mg/dL — AB (ref 65–99)
GLUCOSE-CAPILLARY: 180 mg/dL — AB (ref 65–99)
GLUCOSE-CAPILLARY: 72 mg/dL (ref 65–99)
Glucose-Capillary: 560 mg/dL (ref 65–99)
Glucose-Capillary: 47 mg/dL — ABNORMAL LOW (ref 65–99)
Glucose-Capillary: 72 mg/dL (ref 65–99)
Glucose-Capillary: 75 mg/dL (ref 65–99)

## 2017-01-01 LAB — MRSA PCR SCREENING: MRSA by PCR: INVALID — AB

## 2017-01-01 LAB — I-STAT TROPONIN, ED: Troponin i, poc: 0.02 ng/mL (ref 0.00–0.08)

## 2017-01-01 LAB — BLOOD CULTURE ID PANEL (REFLEXED)
ACINETOBACTER BAUMANNII: NOT DETECTED
CANDIDA ALBICANS: NOT DETECTED
CANDIDA GLABRATA: NOT DETECTED
CANDIDA KRUSEI: NOT DETECTED
CANDIDA PARAPSILOSIS: NOT DETECTED
Candida tropicalis: NOT DETECTED
ENTEROBACTER CLOACAE COMPLEX: NOT DETECTED
ENTEROBACTERIACEAE SPECIES: NOT DETECTED
ESCHERICHIA COLI: NOT DETECTED
Enterococcus species: NOT DETECTED
Haemophilus influenzae: NOT DETECTED
KLEBSIELLA OXYTOCA: NOT DETECTED
Klebsiella pneumoniae: NOT DETECTED
Listeria monocytogenes: NOT DETECTED
Methicillin resistance: DETECTED — AB
Neisseria meningitidis: NOT DETECTED
PSEUDOMONAS AERUGINOSA: NOT DETECTED
Proteus species: NOT DETECTED
STREPTOCOCCUS PNEUMONIAE: NOT DETECTED
Serratia marcescens: NOT DETECTED
Staphylococcus aureus (BCID): NOT DETECTED
Staphylococcus species: DETECTED — AB
Streptococcus agalactiae: NOT DETECTED
Streptococcus pyogenes: NOT DETECTED
Streptococcus species: NOT DETECTED

## 2017-01-01 LAB — RAPID URINE DRUG SCREEN, HOSP PERFORMED
Amphetamines: NOT DETECTED
BARBITURATES: NOT DETECTED
BENZODIAZEPINES: NOT DETECTED
COCAINE: POSITIVE — AB
Opiates: NOT DETECTED
Tetrahydrocannabinol: NOT DETECTED

## 2017-01-01 LAB — I-STAT VENOUS BLOOD GAS, ED
Acid-Base Excess: 9 mmol/L — ABNORMAL HIGH (ref 0.0–2.0)
Bicarbonate: 32.4 mmol/L — ABNORMAL HIGH (ref 20.0–28.0)
O2 SAT: 93 %
PCO2 VEN: 40.2 mmHg — AB (ref 44.0–60.0)
PO2 VEN: 61 mmHg — AB (ref 32.0–45.0)
TCO2: 34 mmol/L (ref 0–100)
pH, Ven: 7.514 — ABNORMAL HIGH (ref 7.250–7.430)

## 2017-01-01 LAB — I-STAT CG4 LACTIC ACID, ED: Lactic Acid, Venous: 1.05 mmol/L (ref 0.5–1.9)

## 2017-01-01 LAB — CK: Total CK: 242 U/L — ABNORMAL HIGH (ref 38–234)

## 2017-01-01 LAB — BRAIN NATRIURETIC PEPTIDE: B Natriuretic Peptide: 4207.8 pg/mL — ABNORMAL HIGH (ref 0.0–100.0)

## 2017-01-01 LAB — MAGNESIUM: Magnesium: 2 mg/dL (ref 1.7–2.4)

## 2017-01-01 LAB — HIV ANTIBODY (ROUTINE TESTING W REFLEX): HIV Screen 4th Generation wRfx: NONREACTIVE

## 2017-01-01 LAB — PHOSPHORUS: Phosphorus: 4.2 mg/dL (ref 2.5–4.6)

## 2017-01-01 MED ORDER — AMLODIPINE BESYLATE 10 MG PO TABS
10.0000 mg | ORAL_TABLET | Freq: Every day | ORAL | Status: DC
  Administered 2017-01-01 (×2): 10 mg via ORAL
  Filled 2017-01-01: qty 1
  Filled 2017-01-01: qty 2

## 2017-01-01 MED ORDER — CALCIUM ACETATE (PHOS BINDER) 667 MG PO CAPS
1334.0000 mg | ORAL_CAPSULE | Freq: Three times a day (TID) | ORAL | Status: DC
  Administered 2017-01-01: 1334 mg via ORAL
  Filled 2017-01-01: qty 2

## 2017-01-01 MED ORDER — LORAZEPAM 2 MG/ML IJ SOLN
2.0000 mg | INTRAMUSCULAR | Status: DC
  Administered 2017-01-02: 2 mg via INTRAVENOUS

## 2017-01-01 MED ORDER — HALOPERIDOL 0.5 MG PO TABS
0.5000 mg | ORAL_TABLET | Freq: Three times a day (TID) | ORAL | Status: DC | PRN
  Filled 2017-01-01: qty 1

## 2017-01-01 MED ORDER — INSULIN ASPART 100 UNIT/ML ~~LOC~~ SOLN
10.0000 [IU] | Freq: Once | SUBCUTANEOUS | Status: AC
Start: 2017-01-01 — End: 2017-01-01
  Administered 2017-01-01: 10 [IU] via SUBCUTANEOUS

## 2017-01-01 MED ORDER — DEXTROSE 5 % IV SOLN: 2.0000 g | INTRAVENOUS | Status: DC

## 2017-01-01 MED ORDER — OXYCODONE HCL 5 MG PO TABS
5.0000 mg | ORAL_TABLET | Freq: Four times a day (QID) | ORAL | Status: DC | PRN
  Administered 2017-01-01 – 2017-01-02 (×2): 10 mg via ORAL
  Administered 2017-01-02: 5 mg via ORAL
  Filled 2017-01-01: qty 2
  Filled 2017-01-01: qty 1
  Filled 2017-01-01: qty 2

## 2017-01-01 MED ORDER — INSULIN ASPART 100 UNIT/ML ~~LOC~~ SOLN
0.0000 [IU] | Freq: Three times a day (TID) | SUBCUTANEOUS | Status: DC
  Administered 2017-01-01: 2 [IU] via SUBCUTANEOUS

## 2017-01-01 MED ORDER — HYDRALAZINE HCL 20 MG/ML IJ SOLN
5.0000 mg | INTRAMUSCULAR | Status: DC | PRN
  Administered 2017-01-01: 20 mg via INTRAVENOUS
  Filled 2017-01-01: qty 1

## 2017-01-01 MED ORDER — ENOXAPARIN SODIUM 40 MG/0.4ML ~~LOC~~ SOLN: 40.0000 mg | SUBCUTANEOUS | Status: DC

## 2017-01-01 MED ORDER — INSULIN GLARGINE 100 UNIT/ML ~~LOC~~ SOLN
22.0000 [IU] | Freq: Every day | SUBCUTANEOUS | Status: DC
  Administered 2017-01-01: 22 [IU] via SUBCUTANEOUS
  Filled 2017-01-01 (×3): qty 0.22

## 2017-01-01 MED ORDER — INSULIN ASPART 100 UNIT/ML ~~LOC~~ SOLN
10.0000 [IU] | Freq: Once | SUBCUTANEOUS | Status: AC
  Administered 2017-01-01: 10 [IU] via SUBCUTANEOUS

## 2017-01-01 MED ORDER — CARVEDILOL 25 MG PO TABS
25.0000 mg | ORAL_TABLET | Freq: Two times a day (BID) | ORAL | Status: DC
Start: 2017-01-01 — End: 2017-01-02
  Administered 2017-01-01 – 2017-01-02 (×4): 25 mg via ORAL
  Filled 2017-01-01 (×4): qty 1

## 2017-01-01 MED ORDER — SEVELAMER CARBONATE 800 MG PO TABS
2400.0000 mg | ORAL_TABLET | Freq: Three times a day (TID) | ORAL | Status: DC
  Administered 2017-01-01 – 2017-01-02 (×2): 2400 mg via ORAL
  Filled 2017-01-01 (×2): qty 3

## 2017-01-01 MED ORDER — FUROSEMIDE 80 MG PO TABS
80.0000 mg | ORAL_TABLET | Freq: Two times a day (BID) | ORAL | Status: DC
  Administered 2017-01-01 – 2017-01-02 (×3): 80 mg via ORAL
  Filled 2017-01-01 (×3): qty 1

## 2017-01-01 MED ORDER — HYDROCODONE-ACETAMINOPHEN 5-325 MG PO TABS
1.0000 | ORAL_TABLET | ORAL | Status: DC | PRN
  Administered 2017-01-01 (×3): 1 via ORAL
  Filled 2017-01-01 (×3): qty 1

## 2017-01-01 MED ORDER — RENA-VITE PO TABS
1.0000 | ORAL_TABLET | Freq: Every day | ORAL | Status: DC
  Administered 2017-01-01 (×2): 1 via ORAL
  Filled 2017-01-01 (×2): qty 1

## 2017-01-01 MED ORDER — VANCOMYCIN HCL IN DEXTROSE 750-5 MG/150ML-% IV SOLN
750.0000 mg | INTRAVENOUS | Status: DC
  Filled 2017-01-01: qty 150

## 2017-01-01 MED ORDER — ATORVASTATIN CALCIUM 40 MG PO TABS
40.0000 mg | ORAL_TABLET | Freq: Every day | ORAL | Status: DC
  Administered 2017-01-01: 40 mg via ORAL
  Filled 2017-01-01: qty 1

## 2017-01-01 MED ORDER — DEXTROSE 5 % IV SOLN
1.0000 g | INTRAVENOUS | Status: DC
  Administered 2017-01-01: 1 g via INTRAVENOUS
  Filled 2017-01-01 (×2): qty 1

## 2017-01-01 MED ORDER — DOXERCALCIFEROL 4 MCG/2ML IV SOLN
1.0000 ug | INTRAVENOUS | Status: DC
  Administered 2017-01-02: 1 ug via INTRAVENOUS
  Filled 2017-01-01: qty 2

## 2017-01-01 MED ORDER — TRAZODONE HCL 50 MG PO TABS
25.0000 mg | ORAL_TABLET | Freq: Every day | ORAL | Status: DC
  Administered 2017-01-01 (×2): 25 mg via ORAL
  Filled 2017-01-01 (×2): qty 1

## 2017-01-01 MED ORDER — OXYCODONE HCL 5 MG PO TABS
5.0000 mg | ORAL_TABLET | ORAL | Status: DC | PRN
  Administered 2017-01-01: 5 mg via ORAL
  Filled 2017-01-01: qty 1

## 2017-01-01 MED ORDER — ALBUTEROL SULFATE (2.5 MG/3ML) 0.083% IN NEBU
2.5000 mg | INHALATION_SOLUTION | RESPIRATORY_TRACT | Status: DC | PRN
Start: 2017-01-01 — End: 2017-01-02

## 2017-01-01 MED ORDER — HYDROCODONE-ACETAMINOPHEN 5-325 MG PO TABS: 1.0000 | ORAL_TABLET | ORAL | Status: DC | PRN

## 2017-01-01 MED ORDER — CLONIDINE HCL 0.1 MG PO TABS
0.1000 mg | ORAL_TABLET | Freq: Three times a day (TID) | ORAL | Status: DC
  Administered 2017-01-01 – 2017-01-02 (×4): 0.1 mg via ORAL
  Filled 2017-01-01 (×4): qty 1

## 2017-01-01 MED ORDER — VANCOMYCIN HCL IN DEXTROSE 750-5 MG/150ML-% IV SOLN
750.0000 mg | INTRAVENOUS | Status: DC
  Administered 2017-01-02: 750 mg via INTRAVENOUS
  Filled 2017-01-01: qty 150

## 2017-01-01 MED ORDER — COLLAGENASE 250 UNIT/GM EX OINT
TOPICAL_OINTMENT | Freq: Every day | CUTANEOUS | Status: DC
  Administered 2017-01-01 – 2017-01-02 (×2): via TOPICAL
  Filled 2017-01-01: qty 30

## 2017-01-01 MED ORDER — SERTRALINE HCL 50 MG PO TABS
50.0000 mg | ORAL_TABLET | Freq: Every day | ORAL | Status: DC
  Administered 2017-01-01 – 2017-01-02 (×2): 50 mg via ORAL
  Filled 2017-01-01 (×3): qty 1

## 2017-01-01 MED ORDER — HEPARIN SODIUM (PORCINE) 5000 UNIT/ML IJ SOLN
5000.0000 [IU] | Freq: Three times a day (TID) | INTRAMUSCULAR | Status: DC
  Administered 2017-01-01 – 2017-01-02 (×4): 5000 [IU] via SUBCUTANEOUS
  Filled 2017-01-01 (×4): qty 1

## 2017-01-01 NOTE — Progress Notes (Signed)
Patient's CBG=560 and BP 198/110.Dr. Alcario Drought notified via text page. Order received. Leolia Vinzant, Wonda Cheng, Therapist, sports

## 2017-01-01 NOTE — Progress Notes (Signed)
New Admission Note:   Arrival Method: Stretcher from ED Mental Orientation: Alert and oriented x4 Telemetry:  Assessment: Completed Skin: see doc flowsheet IV: RFA Pain: See doc flowsheet Tubes: N/A Safety Measures: Safety Fall Prevention Plan has been given, discussed Admission: Completed [address] [place] Orientation: Patient has been orientated to the room, unit and staff.  Family:  Orders have been reviewed and implemented. Will continue to monitor the patient. Call light has been placed within reach and bed alarm has been activated.   [place], RN-BC Phone number: [phone]

## 2017-01-01 NOTE — Consult Note (Signed)
[place] [facility] Nurse wound consult note Reason for Consult:foot wound Wound type: full thickness Pressure Injury POA: N/A Measurement: 3.5cm x 3cm Wound bed: 90% dry hard yellow eschar, 10% black eschar Drainage (amount, consistency, odor) scant Periwound: calloused Dressing procedure/placement/frequency: I have provided nurses with orders,  To wound bed on bottom of left foot, cleanse with NS, pat dry, apply a nickel thick layer of Santyl, cover with NS moistened gauze, dry gauze, adhere, perform daily. We will not follow, but will remain available to this patient, to nursing, and the medical and/or surgical teams.  Please re-consult if we need to assist further.    Fara Olden, RN-C, WTA-C Wound Treatment Associate

## 2017-01-01 NOTE — Progress Notes (Signed)
PROGRESS NOTE  [name]  PJK:[id number] DOB: Dec 09, 1965 DOA: 12/31/2016  Subjective: [name] is a 51 y.o. female with a history of ESRD, cocaine use, chronic pain syndrome, and chronic CHF who presented to the ED 5/29 with severe left anterior thigh aching pain limiting ambulation. She reportedly had a fever to 100.64F. Vancomycin and cefepime were started and hospitalists asked to admit.  Objective: BP (!) 173/74 (BP Location: Right Arm)   Pulse 78   Temp 98.5 F (36.9 C) (Oral)   Resp 18   Ht 5\' 4"  (1.626 m)   Wt 68.7 kg (151 lb 8 oz)   LMP  (LMP Unknown)   SpO2 94%   BMI 26.00 kg/m   General exam: 51 y.o. female in no distress Respiratory system: Non-labored breathing room air. Clear to auscultation bilaterally.  Cardiovascular system: Regular rate and rhythm. No murmur, rub, or gallop. No JVD, and no pedal edema. Gastrointestinal system: Abdomen soft, non-tender, non-distended, with normoactive bowel sounds. No organomegaly or masses felt. Central nervous system: Alert and oriented. No focal neurological deficits. Extremities: Left anterior thigh tender to palpation without significant erythema. Focal induration without fluctuance.  Skin: Diffuse scabs consistent with skin picking. Psychiatry: Judgement and insight appear normal. Mood & affect appropriate.   Assessment & Plan: Left thigh cellulitis/myositis: No abscess or osteo on MRI. No clinical evidence of anterior thigh compartment syndrome.  - Treating presumptively for cellulitis/infectious myositis with vancomycin and cefepime.  - Blood cultures sent - Observe for improvement, and DC on oral abx possibly 5/31.  - Pain control with oxycodone acutely, avoid tylenol with +Hep C.   ESRD: HD at [place] on TTS, missed HD due to pain 5/29.  - HD off schedule today - Suspect CXR findings more consistent with mild pulmonary edema.  HTN: Chronic, elevated at admission. - Plan HD today - Norvasc, coreg, clonidine, lasix  ordered  Chronic combined CHF: June 2017 echo EF 45-50%.  - Continue home medications  T2IDDM: With hyperglycemia, improving.  - Restart home lantus, giving SSI  Cocaine use: +UDS on admission - Cessation counseling provided   [name], MD [facility] Pager [phone]

## 2017-01-01 NOTE — ED Notes (Signed)
Patient transported to Ultrasound 

## 2017-01-01 NOTE — H&P (Addendum)
History and Physical    [name] HLK:[id number] DOB: 06/19/1966 DOA: 12/31/2016  PCP: Patient, [name]  Patient coming from: Home  I have personally briefly reviewed patient's old medical records in [place]  Chief Complaint: R thigh pain  HPI: [name] is a 51 y.o. female with medical history significant of ESRD, cocaine abuse, chronic pain syndrome, CHF.  Patient presents to the ED with c/o 2 day history of worsening L thigh pain and swelling.  Worse with walking.  Aching and dull in quality.  Nothing tried for symptoms.  Denies IVDU (says she smoked crack cocaine last 3 days ago).   ED Course: Tm 100.4 measured by EDP.  Put on cefepime and vanc and hospitalist asked to admit.   Review of Systems: As per HPI otherwise 10 point review of systems negative.   Past Medical History:  Diagnosis Date  . Anemia   . Anxiety   . Bipolar 1 disorder ([place])   . Bipolar disorder, unspecified ([place]) 02/06/2014  . Chronic combined systolic and diastolic CHF (congestive heart failure) (HCC)    EF 40% by echo and 48% by stress test  . Chronic pain syndrome   . Depression   . ESRD on hemodialysis ([place])   . GERD (gastroesophageal reflux disease)   . Glaucoma   . Headache    sinus headaches  . Hepatitis C   . High cholesterol   . History of hiatal hernia   . History of vitamin D deficiency 06/20/2015  . Hypertension   . MRSA infection ?2014   "on the back of my head; spread throughout my bloodstream"  . Neuropathy   . Refusal of blood transfusions as patient is Jehovah's Witness   . Schizoaffective disorder, unspecified condition 02/08/2014  . Seizure [facility]) dx'd 2014   "don't know what kind; last one was ~ 04/2014"  . Stroke [facility])    TIA 2009  . TIA (transient ischemic attack) 2010  . Type II diabetes mellitus ([place])    INSULIN DEPENDENT    Past Surgical History:  Procedure Laterality Date  . ABDOMINAL HYSTERECTOMY  2014  . ABDOMINAL HYSTERECTOMY  2010  . AMPUTATION  Left 05/21/2013   Procedure: Left Midfoot AMPUTATION;  Surgeon: Newt Minion, MD;  Location: [place];  Service: Orthopedics;  Laterality: Left;  Left Midfoot Amputation  . AV FISTULA PLACEMENT Left 05/04/2015   Procedure: ARTERIOVENOUS (AV) GRAFT INSERTION;  Surgeon: Angelia Mould, MD;  Location: [place];  Service: Vascular;  Laterality: Left;  . COLONOSCOPY WITH PROPOFOL N/A 06/22/2013   Procedure: COLONOSCOPY WITH PROPOFOL;  Surgeon: Jeryl Columbia, MD;  Location: [facility] ENDOSCOPY;  Service: Endoscopy;  Laterality: N/A;  . ESOPHAGOGASTRODUODENOSCOPY N/A 05/11/2015   Procedure: ESOPHAGOGASTRODUODENOSCOPY (EGD);  Surgeon: Laurence Spates, MD;  Location[facility] ENDOSCOPY;  Service: Endoscopy;  Laterality: N/A;  . EYE SURGERY Bilateral 2010   Lasik  . FOOT AMPUTATION Left    due diabetic neuropathy, could not feel ulcer on bottom of foot  . REFRACTIVE SURGERY Bilateral 2013  . TONSILLECTOMY  1970's  . TUBAL LIGATION    . TUBAL LIGATION  2000     reports that she quit smoking about 21 months ago. Her smoking use included Cigarettes. She has a 20.00 pack-year smoking history. She has never used smokeless tobacco. She reports that she drinks about 1.8 oz of alcohol per week . She reports that she uses drugs, including "Crack" cocaine and Cocaine.  Allergies  Allergen Reactions  .  Penicillins Hives and Other (See Comments)    Has patient had a PCN reaction causing immediate rash, facial/tongue/throat swelling, SOB or lightheadedness with hypotension: Yes Has patient had a PCN reaction causing severe rash involving mucus membranes or skin necrosis: No Has patient had a PCN reaction that required hospitalization No Has patient had a PCN reaction occurring within the last 10 years: No If all of the above answers are "NO", then may proceed with Cephalosporin use. Pt tolerates cefepime and ceftriaxone  . Gabapentin Itching and Swelling  . Lyrica [Pregabalin] Swelling and Other (See Comments)    Facial and  leg swelling  . Saphris [Asenapine] Swelling and Other (See Comments)    Facial swelling  . Tramadol Swelling and Other (See Comments)    Leg swelling  . Other Other (See Comments)    NO BLOOD PRODUCTS. 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 Hypertension Mother   . Hypertension Father      Prior to Admission medications   Medication Sig Start Date End Date Taking? Authorizing Provider  albuterol (PROVENTIL HFA;VENTOLIN HFA) 108 (90 Base) MCG/ACT inhaler Inhale 2 puffs into the lungs every 4 (four) hours as needed for wheezing or shortness of breath. 10/15/16  Yes Horton, Barbette Hair, MD  amLODipine (NORVASC) 10 MG tablet Take 1 tablet (10 mg total) by mouth at bedtime. 09/02/16  Yes Hildred Priest, MD  atorvastatin (LIPITOR) 40 MG tablet Take 1 tablet (40 mg total) by mouth at bedtime. 09/02/16  Yes Hildred Priest, MD  calcium acetate (PHOSLO) 667 MG capsule Take 2 capsules (1,334 mg total) by mouth 3 (three) times daily with meals. 09/02/16  Yes Hildred Priest, MD  carvedilol (COREG) 25 MG tablet Take 1 tablet (25 mg total) by mouth 2 (two) times daily with a meal. 09/02/16  Yes Hildred Priest, MD  cloNIDine (CATAPRES) 0.1 MG tablet Take 1 tablet (0.1 mg total) by mouth every 8 (eight) hours. 09/02/16  Yes Hildred Priest, MD  doxercalciferol (HECTOROL) 4 MCG/2ML injection Inject 0.5 mLs (1 mcg total) into the vein Every Tuesday,Thursday,and Saturday with dialysis. 09/03/16  Yes Hildred Priest, MD  furosemide (LASIX) 80 MG tablet Take 1 tablet (80 mg total) by mouth 2 (two) times daily. 09/02/16  Yes Hildred Priest, MD  haloperidol (HALDOL) 0.5 MG tablet Take 1 tablet (0.5 mg total) by mouth every 8 (eight) hours as needed for agitation. 09/03/16  Yes Hildred Priest, MD  insulin aspart (NOVOLOG) 100 UNIT/ML injection Inject 5 Units into the skin 3 (three) times daily  with meals. 09/03/16  Yes Hildred Priest, MD  insulin glargine (LANTUS) 100 UNIT/ML injection Inject 0.22 mLs (22 Units total) into the skin at bedtime. 09/03/16  Yes Hildred Priest, MD  lidocaine-prilocaine (EMLA) cream Apply 1 application topically See admin instructions. Apply to dialysis site one hour prior to dialysis   Yes [provider]  LORazepam (ATIVAN) 2 MG/ML injection Inject 1 mL (2 mg total) into the vein Every Tuesday,Thursday,and Saturday with dialysis. 09/03/16  Yes Hildred Priest, MD  multivitamin (RENA-VIT) TABS tablet Take 1 tablet by mouth at bedtime. 09/02/16  Yes Hildred Priest, MD  sertraline (ZOLOFT) 50 MG tablet Take 1 tablet (50 mg total) by mouth daily. 09/02/16  Yes Hildred Priest, MD  traZODone (DESYREL) 50 MG tablet Take 0.5 tablets (25 mg total) by mouth at bedtime. 09/02/16  Yes Hildred Priest, MD    Physical Exam: Vitals:   12/31/16 2215 12/31/16  2245 12/31/16 2354 01/01/17 0051  BP: (!) 209/124 (!) 229/119  (!) 186/95  Pulse: (!) 112 (!) 119 (!) 108   Resp:   (!) 21   Temp:      TempSrc:      SpO2: 93% 90% 96%     Constitutional: NAD, calm, comfortable Eyes: PERRL, lids and conjunctivae normal ENMT: Mucous membranes are moist. Posterior pharynx clear of any exudate or lesions.Normal dentition.  Neck: normal, supple, no masses, no thyromegaly Respiratory: clear to auscultation bilaterally, no wheezing, no crackles. Normal respiratory effort. No accessory muscle use.  Cardiovascular: Regular rate and rhythm, no murmurs / rubs / gallops. No extremity edema. 2+ pedal pulses. No carotid bruits.  Abdomen: no tenderness, no masses palpated. No hepatosplenomegaly. Bowel sounds positive.  Musculoskeletal: no clubbing / cyanosis. No joint deformity upper and lower extremities. Good ROM, no contractures. Normal muscle tone.  Skin: Swelling, erythema, induration of L thigh Neurologic: CN  2-12 grossly intact. Sensation intact, DTR normal. Strength 5/5 in all 4.  Psychiatric: Normal judgment and insight. Alert and oriented x 3. Normal mood.    Labs on Admission: I have personally reviewed following labs and imaging studies  CBC:  Recent Labs Lab 12/31/16 2155  WBC 3.2*  NEUTROABS 2.0  HGB 12.0  HCT 36.2  MCV 85.8  PLT 376   Basic Metabolic Panel:  Recent Labs Lab 12/31/16 2155 12/31/16 2350  NA 128*  --   K 4.6  --   CL 81*  --   CO2 29  --   GLUCOSE 509*  --   BUN 41*  --   CREATININE 7.77*  --   CALCIUM 8.8*  --   MG  --  2.0  PHOS  --  4.2   GFR: CrCl cannot be calculated (Unknown ideal weight.). Liver Function Tests:  Recent Labs Lab 12/31/16 2155  AST 25  ALT 18  ALKPHOS 120  BILITOT 0.9  PROT 7.6  ALBUMIN 3.0*    Recent Labs Lab 12/31/16 2155  LIPASE 29   No results for input(s): AMMONIA in the last 168 hours. Coagulation Profile: No results for input(s): INR, PROTIME in the last 168 hours. Cardiac Enzymes:  Recent Labs Lab 12/31/16 2155  CKTOTAL 295*   BNP (last 3 results) No results for input(s): PROBNP in the last 8760 hours. HbA1C: No results for input(s): HGBA1C in the last 72 hours. CBG: No results for input(s): GLUCAP in the last 168 hours. Lipid Profile: No results for input(s): CHOL, HDL, LDLCALC, TRIG, CHOLHDL, LDLDIRECT in the last 72 hours. Thyroid Function Tests: No results for input(s): TSH, T4TOTAL, FREET4, T3FREE, THYROIDAB in the last 72 hours. Anemia Panel: No results for input(s): VITAMINB12, FOLATE, FERRITIN, TIBC, IRON, RETICCTPCT in the last 72 hours. Urine analysis:    Component Value Date/Time   COLORURINE YELLOW 08/26/2016 1045   APPEARANCEUR CLOUDY (A) 08/26/2016 1045   LABSPEC 1.016 08/26/2016 1045   PHURINE 6.0 08/26/2016 1045   GLUCOSEU >=500 (A) 08/26/2016 1045   HGBUR NEGATIVE 08/26/2016 [place] 08/26/2016 1045   BILIRUBINUR neg 12/13/2014 1127   KETONESUR  NEGATIVE 08/26/2016 1045   PROTEINUR 100 (A) 08/26/2016 1045   UROBILINOGEN 0.2 05/01/2015 1252   NITRITE NEGATIVE 08/26/2016 1045   LEUKOCYTESUR NEGATIVE 08/26/2016 1045    Radiological Exams on Admission: Dg Chest Portable 1 View  Result Date: 12/31/2016 CLINICAL DATA:  Acute onset of hypoxia.  Initial encounter. EXAM: PORTABLE CHEST 1 VIEW COMPARISON:  Chest radiograph  performed 10/15/2016 FINDINGS: The lungs are well-aerated. Vascular congestion is noted. Hazy right basilar airspace opacity may reflect mild interstitial edema or possibly pneumonia. There is no evidence of pleural effusion or pneumothorax. The cardiomediastinal silhouette is mildly enlarged. No acute osseous abnormalities are seen. IMPRESSION: Vascular congestion and mild cardiomegaly. Hazy right basilar airspace opacity may reflect mild interstitial edema or possibly pneumonia. Electronically Signed   By: Garald Balding M.D.   On: 12/31/2016 23:46   Dg Femur Min 2 Views Left  Result Date: 12/31/2016 CLINICAL DATA:  Initial evaluation for recent fall, pain. EXAM: LEFT FEMUR 2 VIEWS COMPARISON:  None. FINDINGS: No acute fracture or dislocation. Limited views of the hip and knee are grossly unremarkable. No acute soft tissue abnormality. Prominent vascular calcifications noted. IMPRESSION: 1. No acute fracture or dislocation. 2. Prominent soft tissue atherosclerotic changes for patient age. Electronically Signed   By: Jeannine Boga M.D.   On: 12/31/2016 19:02    EKG: Independently reviewed.  Assessment/Plan Principal Problem:   Cellulitis of left thigh Active Problems:   Acute on chronic combined systolic and diastolic CHF (congestive heart failure) (HCC)   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 (HCC)   Chronic pain syndrome   ESRD (end stage renal disease) ([place])    1. Cellulitis of L thigh - 1. Will leave patient on cefepime and vanc for now 2. US soft tissue to look for abscess 3. Norco PRN  pain 2. Acute on chronic CHF in setting of ESRD - 1. Overdue for dialysis missed today 2. Dialysis likely tomorrow hopefully 3. Resume all home BP meds now 3. DM2 - 1. Continue home lantus 2. Sensitive scale SSI AC 4. Chronic pain syndrome - 1. Patient notes that no one will prescribe her narcotics 2. Does acknowledge that drug abuse is a part of this 5. Cocaine abuse - checking UDS to see if anything else being used  DVT prophylaxis: Heparin [place] Code Status: Full Family Communication: No family in room Disposition Plan: Home after admit Consults called: Left message with dialysis consult line for dialysis likely later today Admission status: Place in [place], [place] Hospitalists Pager [phone]  If 7AM-7PM, please contact day team taking care of patient [email or web address] Password [email or web address]  01/01/2017, 12:59 AM

## 2017-01-01 NOTE — ED Notes (Signed)
Pt returned from MRI, vital signs stable, A&O x 4.

## 2017-01-01 NOTE — ED Notes (Signed)
Notified by ultrasound that pt is going to MRI. 6E updated on delay.

## 2017-01-01 NOTE — Progress Notes (Signed)
Received report from ED RN. Patient is going for Ultrasound first then MRI per RN. [name], [name], Therapist, sports

## 2017-01-01 NOTE — Consult Note (Signed)
[facility] Renal Consultation Note    Indication for Consultation:  Management of ESRD/hemodialysis; anemia, hypertension/volume and secondary hyperparathyroidism   HPI: [name] is a 51 y.o. female with ESRD on HD, DM Type 2, HTN, Hep C+, chronic combined CHF (EF 45-50% Echo 01/2016), bipolar/schizoaffective disorder, Jehovah's Witness.  She presented to ED yesterday with severe pain and swelling in her left thigh. Pain began 2 days prior. She was unable to attend her sheduled dialysis session on Tuesday because of the pain. Febrile on admission. No fracture on xray. No abscess or osteo on MRI. Empiric antibiotics started.  ED course also significant for R basilar airspace opacity.   Admitted under observation status for further evaluation of cellulitis.   Seen at bedside. Says pain has improved with meds. Denies trauma to leg, but does frequently pick at skin. Appears comfortable. Denies chills, chest pain, SOB, N/V/D. Missed HD yesterday because she could barley stand. Had family members bring her to ED.   Dialyzes at [facility] TTS. Last HD was Saturday 5/26.   Past Medical History:  Diagnosis Date  . Anemia   . Anxiety   . Bipolar 1 disorder ([place])   . Bipolar disorder, unspecified ([place]) 02/06/2014  . Chronic combined systolic and diastolic CHF (congestive heart failure) (HCC)    EF 40% by echo and 48% by stress test  . Chronic pain syndrome   . Depression   . ESRD on hemodialysis ([place])   . GERD (gastroesophageal reflux disease)   . Glaucoma   . Headache    sinus headaches  . Hepatitis C   . High cholesterol   . History of hiatal hernia   . History of vitamin D deficiency 06/20/2015  . Hypertension   . MRSA infection ?2014   "on the back of my head; spread throughout my bloodstream"  . Neuropathy   . Refusal of blood transfusions as patient is Jehovah's Witness   . Schizoaffective disorder, unspecified condition 02/08/2014  . Seizure [facility])  dx'd 2014   "don't know what kind; last one was ~ 04/2014"  . Stroke [facility])    TIA 2009  . TIA (transient ischemic attack) 2010  . Type II diabetes mellitus ([place])    INSULIN DEPENDENT   Past Surgical History:  Procedure Laterality Date  . ABDOMINAL HYSTERECTOMY  2014  . ABDOMINAL HYSTERECTOMY  2010  . AMPUTATION Left 05/21/2013   Procedure: Left Midfoot AMPUTATION;  Surgeon: Newt Minion, MD;  Location: [place];  Service: Orthopedics;  Laterality: Left;  Left Midfoot Amputation  . AV FISTULA PLACEMENT Left 05/04/2015   Procedure: ARTERIOVENOUS (AV) GRAFT INSERTION;  Surgeon: Angelia Mould, MD;  Location: [place];  Service: Vascular;  Laterality: Left;  . COLONOSCOPY WITH PROPOFOL N/A 06/22/2013   Procedure: COLONOSCOPY WITH PROPOFOL;  Surgeon: Jeryl Columbia, MD;  Location: [facility] ENDOSCOPY;  Service: Endoscopy;  Laterality: N/A;  . ESOPHAGOGASTRODUODENOSCOPY N/A 05/11/2015   Procedure: ESOPHAGOGASTRODUODENOSCOPY (EGD);  Surgeon: Laurence Spates, MD;  Location: [facility] ENDOSCOPY;  Service: Endoscopy;  Laterality: N/A;  . EYE SURGERY Bilateral 2010   Lasik  . FOOT AMPUTATION Left    due diabetic neuropathy, could not feel ulcer on bottom of foot  . REFRACTIVE SURGERY Bilateral 2013  . TONSILLECTOMY  1970's  . TUBAL LIGATION    . TUBAL LIGATION  2000   Family History  Problem Relation Age of Onset  . Hypertension Mother   . Hypertension Father    Social History:  reports that she quit smoking about 21 months ago. Her smoking use included Cigarettes. She has a 20.00 pack-year smoking history. She has never used smokeless tobacco. She reports that she drinks about 1.8 oz of alcohol per week . She reports that she uses drugs, including "Crack" cocaine and Cocaine. Allergies  Allergen Reactions  . Penicillins Hives and Other (See Comments)    Has patient had a PCN reaction causing immediate rash, facial/tongue/throat swelling, SOB or lightheadedness with hypotension: Yes Has patient had a PCN  reaction causing severe rash involving mucus membranes or skin necrosis: No Has patient had a PCN reaction that required hospitalization No Has patient had a PCN reaction occurring within the last 10 years: No If all of the above answers are "NO", then may proceed with Cephalosporin use. Pt tolerates cefepime and ceftriaxone  . Gabapentin Itching and Swelling  . Lyrica [Pregabalin] Swelling and Other (See Comments)    Facial and leg swelling  . Saphris [Asenapine] Swelling and Other (See Comments)    Facial swelling  . Tramadol Swelling and Other (See Comments)    Leg swelling  . Other Other (See Comments)    NO BLOOD PRODUCTS. PT IS A JEHOVAH WITNESS   Prior to Admission medications   Medication Sig Start Date End Date Taking? Authorizing Provider  albuterol (PROVENTIL HFA;VENTOLIN HFA) 108 (90 Base) MCG/ACT inhaler Inhale 2 puffs into the lungs every 4 (four) hours as needed for wheezing or shortness of breath. 10/15/16  Yes Horton, Barbette Hair, MD  amLODipine (NORVASC) 10 MG tablet Take 1 tablet (10 mg total) by mouth at bedtime. 09/02/16  Yes Hildred Priest, MD  atorvastatin (LIPITOR) 40 MG tablet Take 1 tablet (40 mg total) by mouth at bedtime. 09/02/16  Yes Hildred Priest, MD  calcium acetate (PHOSLO) 667 MG capsule Take 2 capsules (1,334 mg total) by mouth 3 (three) times daily with meals. 09/02/16  Yes Hildred Priest, MD  carvedilol (COREG) 25 MG tablet Take 1 tablet (25 mg total) by mouth 2 (two) times daily with a meal. 09/02/16  Yes Hildred Priest, MD  cloNIDine (CATAPRES) 0.1 MG tablet Take 1 tablet (0.1 mg total) by mouth every 8 (eight) hours. 09/02/16  Yes Hildred Priest, MD  doxercalciferol (HECTOROL) 4 MCG/2ML injection Inject 0.5 mLs (1 mcg total) into the vein Every Tuesday,Thursday,and Saturday with dialysis. 09/03/16  Yes Hildred Priest, MD  furosemide (LASIX) 80 MG tablet Take 1 tablet (80 mg total) by mouth  2 (two) times daily. 09/02/16  Yes Hildred Priest, MD  haloperidol (HALDOL) 0.5 MG tablet Take 1 tablet (0.5 mg total) by mouth every 8 (eight) hours as needed for agitation. 09/03/16  Yes Hildred Priest, MD  insulin aspart (NOVOLOG) 100 UNIT/ML injection Inject 5 Units into the skin 3 (three) times daily with meals. 09/03/16  Yes Hildred Priest, MD  insulin glargine (LANTUS) 100 UNIT/ML injection Inject 0.22 mLs (22 Units total) into the skin at bedtime. 09/03/16  Yes Hildred Priest, MD  lidocaine-prilocaine (EMLA) cream Apply 1 application topically See admin instructions. Apply to dialysis site one hour prior to dialysis   Yes [provider]  LORazepam (ATIVAN) 2 MG/ML injection Inject 1 mL (2 mg total) into the vein Every Tuesday,Thursday,and Saturday with dialysis. 09/03/16  Yes Hildred Priest, MD  multivitamin (RENA-VIT) TABS tablet Take 1 tablet by mouth at bedtime. 09/02/16  Yes Hildred Priest, MD  sertraline (ZOLOFT) 50 MG tablet Take 1 tablet (50 mg total) by mouth daily. 09/02/16  Yes Hildred Priest,  MD  traZODone (DESYREL) 50 MG tablet Take 0.5 tablets (25 mg total) by mouth at bedtime. 09/02/16  Yes Hildred Priest, MD   Current Facility-Administered Medications  Medication Dose Route Frequency Provider Last Rate Last Dose  . albuterol (PROVENTIL) (2.5 MG/3ML) 0.083% nebulizer solution 2.5 mg  2.5 mg Inhalation Q4H PRN Etta Quill, DO      . amLODipine (NORVASC) tablet 10 mg  10 mg Oral QHS Jennette Kettle M, DO   10 mg at 01/01/17 0051  . atorvastatin (LIPITOR) tablet 40 mg  40 mg Oral QHS Jennette Kettle M, DO   40 mg at 01/01/17 0125  . calcium acetate (PHOSLO) capsule 1,334 mg  1,334 mg Oral TID WC Etta Quill, DO   1,334 mg at 01/01/17 3295  . carvedilol (COREG) tablet 25 mg  25 mg Oral BID WC Jennette Kettle M, DO   25 mg at 01/01/17 1884  . ceFEPIme (MAXIPIME) 1 g in dextrose 5  % 50 mL IVPB  1 g Intravenous Q24H [name] B, MD      . cloNIDine (CATAPRES) tablet 0.1 mg  0.1 mg Oral Q8H Jennette Kettle M, DO   0.1 mg at 01/01/17 0518  . collagenase (SANTYL) ointment   Topical Daily Patrecia Pour, MD      . Derrill Memo ON 01/02/2017] doxercalciferol (HECTOROL) injection 1 mcg  1 mcg Intravenous Q T,Th,Sa-HD Jennette Kettle M, DO      . furosemide (LASIX) tablet 80 mg  80 mg Oral BID Etta Quill, DO   80 mg at 01/01/17 1660  . haloperidol (HALDOL) tablet 0.5 mg  0.5 mg Oral Q8H PRN Etta Quill, DO      . heparin injection 5,000 Units  5,000 Units Subcutaneous Q8H Jennette Kettle M, DO   5,000 Units at 01/01/17 0518  . hydrALAZINE (APRESOLINE) injection 5-20 mg  5-20 mg Intravenous Q4H PRN Etta Quill, DO   20 mg at 01/01/17 0520  . HYDROcodone-acetaminophen (NORCO/VICODIN) 5-325 MG per tablet 1 tablet  1 tablet Oral Q4H PRN Etta Quill, DO   1 tablet at 01/01/17 0850  . insulin aspart (novoLOG) injection 0-9 Units  0-9 Units Subcutaneous TID WC Etta Quill, DO      . insulin glargine (LANTUS) injection 22 Units  22 Units Subcutaneous QHS Etta Quill, DO   22 Units at 01/01/17 [phone]  . [START ON 01/02/2017] LORazepam (ATIVAN) injection 2 mg  2 mg Intravenous Q T,Th,Sa-HD Jennette Kettle M, DO      . multivitamin (RENA-VIT) tablet 1 tablet  1 tablet Oral QHS Etta Quill, DO   1 tablet at 01/01/17 0125  . sertraline (ZOLOFT) tablet 50 mg  50 mg Oral Daily Jennette Kettle M, DO   50 mg at 01/01/17 6010  . traZODone (DESYREL) tablet 25 mg  25 mg Oral QHS Jennette Kettle M, DO   25 mg at 01/01/17 0107  . [START ON 01/02/2017] vancomycin (VANCOCIN) IVPB 750 mg/150 ml premix  750 mg Intravenous Q T,Th,Sa-HD Jennette Kettle M, DO        ROS: As per HPI otherwise negative.  Physical Exam: Vitals:   01/01/17 0145 01/01/17 0357 01/01/17 0427 01/01/17 0939  BP: (!) 191/83 (!) 177/73 (!) 198/110 (!) 173/74  Pulse: 87 84 84 78  Resp: 13 16 16 18   Temp:   98.2  F (36.8 C) 98.5 F (36.9 C)  TempSrc:   Oral Oral  SpO2: 98% 98% 90%  94%  Weight:   68.7 kg (151 lb 8 oz)   Height:   5\' 4"  (1.626 m)      General: WDWN AAF NAD  Head: NCAT sclera not icteric MMM Neck: Supple. No JVD No masses Lungs: CTA bilaterally without wheezes, rales, or rhonchi. Breathing is unlabored. Heart: RRR with S1 S2 Abdomen: soft NT + BS Lower extremities: anterior L thigh tender, fluctuant, warm to touch, no edema  Neuro: A & O  X 3. Moves all extremities spontaneously. Psych:  Responds to questions appropriately with a normal affect. Dialysis Access: LUE AVG +bruit   Labs: Basic Metabolic Panel:  Recent Labs Lab 12/31/16 2155 12/31/16 2350  NA 128*  --   K 4.6  --   CL 81*  --   CO2 29  --   GLUCOSE 509*  --   BUN 41*  --   CREATININE 7.77*  --   CALCIUM 8.8*  --   PHOS  --  4.2   Liver Function Tests:  Recent Labs Lab 12/31/16 2155  AST 25  ALT 18  ALKPHOS 120  BILITOT 0.9  PROT 7.6  ALBUMIN 3.0*    Recent Labs Lab 12/31/16 2155  LIPASE 29   No results for input(s): AMMONIA in the last 168 hours. CBC:  Recent Labs Lab 12/31/16 2155  WBC 3.2*  NEUTROABS 2.0  HGB 12.0  HCT 36.2  MCV 85.8  PLT 311   Cardiac Enzymes:  Recent Labs Lab 12/31/16 2155 12/31/16 2350  CKTOTAL 295* 242*   CBG:  Recent Labs Lab 01/01/17 0432 01/01/17 0750 01/01/17 0849 01/01/17 1203  GLUCAP 560* 412* 371* 180*   Iron Studies: No results for input(s): IRON, TIBC, TRANSFERRIN, FERRITIN in the last 72 hours. Studies/Results: Mr Femur Left Wo Contrast  Result Date: 01/01/2017 CLINICAL DATA:  Increasing left thigh pain and swelling for 2 days in patient with a history of end-stage renal disease and drug abuse. EXAM: MR OF THE LEFT FEMUR WITHOUT CONTRAST TECHNIQUE: Multiplanar, multisequence MR imaging of the left upper leg was performed. No intravenous contrast was administered. COMPARISON:  None. FINDINGS: Bones/Joint/Cartilage The axial and  coronal sequences incidentally include the right upper leg. Bone marrow signal is normal without fracture, stress change or evidence of osteomyelitis. No avascular necrosis of the femoral heads is identified. Ligaments Negative. Muscles and Tendons Intense edema is present throughout the musculature of the anterior compartment of left upper leg. No intramuscular fluid collection is identified. Much milder degree of edema is seen in the distal right vastus lateralis and intermedius. No muscle or tendon tear or strain is identified. Soft tissues Bilateral upper leg subcutaneous edema is much worse on the left. Small bilateral knee joint effusions are identified. IMPRESSION: Edema throughout the anterior compartment musculature of the left thigh most worrisome for rhabdomyolysis. Compartment syndrome and infectious myositis far possible but thought less likely. Much milder degree of edema is present in the right vastus lateralis and intermedius. Negative for abscess or osteomyelitis. Electronically Signed   By: Inge Rise M.D.   On: 01/01/2017 08:18   Dg Chest Portable 1 View  Result Date: 12/31/2016 CLINICAL DATA:  Acute onset of hypoxia.  Initial encounter. EXAM: PORTABLE CHEST 1 VIEW COMPARISON:  Chest radiograph performed 10/15/2016 FINDINGS: The lungs are well-aerated. Vascular congestion is noted. Hazy right basilar airspace opacity may reflect mild interstitial edema or possibly pneumonia. There is no evidence of pleural effusion or pneumothorax. The cardiomediastinal silhouette is mildly enlarged. No acute osseous  abnormalities are seen. IMPRESSION: Vascular congestion and mild cardiomegaly. Hazy right basilar airspace opacity may reflect mild interstitial edema or possibly pneumonia. Electronically Signed   By: Garald Balding M.D.   On: 12/31/2016 23:46   [place] Soft Tissue Non Vascular  Result Date: 01/01/2017 CLINICAL DATA:  Assess left thigh cellulitis.  Initial encounter.  EXAM: ULTRASOUND LEFT LOWER EXTREMITY LIMITED TECHNIQUE: Ultrasound examination of the lower extremity soft tissues was performed in the area of clinical concern. COMPARISON:  None. FINDINGS: Joint Space: Not assessed. Muscles: There is vague edema about the superficial musculature. Tendons: Not assessed. Other Soft Tissue Structures: Mild soft tissue edema is noted tracking about the fat overlying the musculature of the left lower thigh. There is no evidence of hyperemia. There is no evidence of abscess. IMPRESSION: Mild soft tissue edema noted, tracking to the superficial musculature of the left lower thigh. No evidence of abscess or hyperemia. Electronically Signed   By: Garald Balding M.D.   On: 01/01/2017 02:18   Dg Femur Min 2 Views Left  Result Date: 12/31/2016 CLINICAL DATA:  Initial evaluation for recent fall, pain. EXAM: LEFT FEMUR 2 VIEWS COMPARISON:  None. FINDINGS: No acute fracture or dislocation. Limited views of the hip and knee are grossly unremarkable. No acute soft tissue abnormality. Prominent vascular calcifications noted. IMPRESSION: 1. No acute fracture or dislocation. 2. Prominent soft tissue atherosclerotic changes for patient age. Electronically Signed   By: Jeannine Boga M.D.   On: 12/31/2016 19:02    Dialysis Orders:  [facility] 3.5h 180F BFR 400/800 2K/2.25 CA UF Prof 2 EDW 63.5 kg  L AVG No heparin Hectorol 1 mcg IV q HD Mircera 150 mcg IV q 2 weeks (last dose 5/22) Venofer 50mg  IV q week   Assessment/Plan: 1.  Fever/L thigh cellulitis - No osteo/abscess on MRI/ On IV Cefepime/Vanc/ Blood cx pending - per primary  2.  ESRD -  TTS - plan HD off schedule today d/t missed treatment Tuesday but likely will have regular HD Thursday with heavy schedule in HD unit  3.  Hypertension/volume  - BP elevated  Cont home meds should improve with HD/mild pulm edema on CXR 4.  Anemia  - Hgb 12.0 follow  5.  Metabolic bone disease -  Cont VDRA/Renvela binder  6.  Nutrition -  renal diet/fluid rest/ prot supp for low albumin 7. DM - per primary 8. Biploar/schizoaffective disorder  9. Polysubstance abuse/chronic pain disorder   Lynnda Child PA-C [facility] Pager [phone] 01/01/2017, 12:07 PM  Renal Attending:  I agree with note as articulated above. Aprille Sawhney C

## 2017-01-01 NOTE — Progress Notes (Signed)
PHARMACY - PHYSICIAN COMMUNICATION CRITICAL VALUE ALERT - BLOOD CULTURE IDENTIFICATION (BCID)  Results for orders placed or performed during the hospital encounter of 12/31/16  Blood Culture ID Panel (Reflexed) (Collected: 12/31/2016 11:57 PM)  Result Value Ref Range   Enterococcus species NOT DETECTED NOT DETECTED   Listeria monocytogenes NOT DETECTED NOT DETECTED   Staphylococcus species DETECTED (A) NOT DETECTED   Staphylococcus aureus NOT DETECTED NOT DETECTED   Methicillin resistance DETECTED (A) NOT DETECTED   Streptococcus species NOT DETECTED NOT DETECTED   Streptococcus agalactiae NOT DETECTED NOT DETECTED   Streptococcus pneumoniae NOT DETECTED NOT DETECTED   Streptococcus pyogenes NOT DETECTED NOT DETECTED   Acinetobacter baumannii NOT DETECTED NOT DETECTED   Enterobacteriaceae species NOT DETECTED NOT DETECTED   Enterobacter cloacae complex NOT DETECTED NOT DETECTED   Escherichia coli NOT DETECTED NOT DETECTED   Klebsiella oxytoca NOT DETECTED NOT DETECTED   Klebsiella pneumoniae NOT DETECTED NOT DETECTED   Proteus species NOT DETECTED NOT DETECTED   Serratia marcescens NOT DETECTED NOT DETECTED   Haemophilus influenzae NOT DETECTED NOT DETECTED   Neisseria meningitidis NOT DETECTED NOT DETECTED   Pseudomonas aeruginosa NOT DETECTED NOT DETECTED   Candida albicans NOT DETECTED NOT DETECTED   Candida glabrata NOT DETECTED NOT DETECTED   Candida krusei NOT DETECTED NOT DETECTED   Candida parapsilosis NOT DETECTED NOT DETECTED   Candida tropicalis NOT DETECTED NOT DETECTED    Name of physician (or Provider) Contacted:   NA  Changes to prescribed antibiotics required:   None needed.  ESRD on HD, 1/2 Blood cultures growing MR-CoNS, already receiving Vancomycin   Caryl Pina 01/01/2017  11:32 PM

## 2017-01-01 NOTE — Progress Notes (Signed)
Pharmacy Antibiotic Note  [name] is a 51 y.o. female admitted on 12/31/2016 with L thigh cellulitis.  Pharmacy has been consulted for Vancomycin and Cefepime dosing.  Plan: Vancomycin 1500 mg IV now, then 750 mg IV after each HD Cefepime 2 g IV q24h     Temp (24hrs), Avg:98.8 F (37.1 C), Min:98.7 F (37.1 C), Max:98.9 F (37.2 C)   Recent Labs Lab 12/31/16 2155 01/01/17 0007  WBC 3.2*  --   CREATININE 7.77*  --   LATICACIDVEN  --  1.05    CrCl cannot be calculated (Unknown ideal weight.).    Allergies  Allergen Reactions  . Penicillins Hives and Other (See Comments)    Has patient had a PCN reaction causing immediate rash, facial/tongue/throat swelling, SOB or lightheadedness with hypotension: Yes Has patient had a PCN reaction causing severe rash involving mucus membranes or skin necrosis: No Has patient had a PCN reaction that required hospitalization No Has patient had a PCN reaction occurring within the last 10 years: No If all of the above answers are "NO", then may proceed with Cephalosporin use. Pt tolerates cefepime and ceftriaxone  . Gabapentin Itching and Swelling  . Lyrica [Pregabalin] Swelling and Other (See Comments)    Facial and leg swelling  . Saphris [Asenapine] Swelling and Other (See Comments)    Facial swelling  . Tramadol Swelling and Other (See Comments)    Leg swelling  . Other Other (See Comments)    NO BLOOD PRODUCTS. PT IS A JEHOVAH WITNESS    Caryl Pina 01/01/2017 1:22 AM

## 2017-01-02 DIAGNOSIS — I5043 Acute on chronic combined systolic (congestive) and diastolic (congestive) heart failure: Secondary | ICD-10-CM

## 2017-01-02 LAB — CBC
HCT: 30.7 % — ABNORMAL LOW (ref 36.0–46.0)
Hemoglobin: 9.9 g/dL — ABNORMAL LOW (ref 12.0–15.0)
MCH: 27.2 pg (ref 26.0–34.0)
MCHC: 32.2 g/dL (ref 30.0–36.0)
MCV: 84.3 fL (ref 78.0–100.0)
PLATELETS: 280 10*3/uL (ref 150–400)
RBC: 3.64 MIL/uL — ABNORMAL LOW (ref 3.87–5.11)
RDW: 15.7 % — AB (ref 11.5–15.5)
WBC: 5.2 10*3/uL (ref 4.0–10.5)

## 2017-01-02 LAB — HEPATITIS B SURFACE ANTIGEN: HEP B S AG: NEGATIVE

## 2017-01-02 LAB — RENAL FUNCTION PANEL
ALBUMIN: 2.4 g/dL — AB (ref 3.5–5.0)
Anion gap: 15 (ref 5–15)
BUN: 54 mg/dL — AB (ref 6–20)
CHLORIDE: 85 mmol/L — AB (ref 101–111)
CO2: 29 mmol/L (ref 22–32)
CREATININE: 8.51 mg/dL — AB (ref 0.44–1.00)
Calcium: 8.1 mg/dL — ABNORMAL LOW (ref 8.9–10.3)
GFR calc Af Amer: 6 mL/min — ABNORMAL LOW (ref >60)
GFR, EST NON AFRICAN AMERICAN: 5 mL/min — AB (ref >60)
GLUCOSE: 62 mg/dL — AB (ref 65–99)
PHOSPHORUS: 4.9 mg/dL — AB (ref 2.5–4.6)
Potassium: 4.6 mmol/L (ref 3.5–5.1)
Sodium: 129 mmol/L — ABNORMAL LOW (ref 135–145)

## 2017-01-02 LAB — GLUCOSE, CAPILLARY
GLUCOSE-CAPILLARY: 84 mg/dL (ref 65–99)
Glucose-Capillary: 107 mg/dL — ABNORMAL HIGH (ref 65–99)
Glucose-Capillary: 115 mg/dL — ABNORMAL HIGH (ref 65–99)

## 2017-01-02 LAB — URINE CULTURE

## 2017-01-02 MED ORDER — PENTAFLUOROPROP-TETRAFLUOROETH EX AERO: 1.0000 "application " | INHALATION_SPRAY | CUTANEOUS | Status: DC | PRN

## 2017-01-02 MED ORDER — SODIUM CHLORIDE 0.9 % IV SOLN: 100.0000 mL | INTRAVENOUS | Status: DC | PRN

## 2017-01-02 MED ORDER — CLINDAMYCIN HCL 300 MG PO CAPS: 600.0000 mg | ORAL_CAPSULE | Freq: Three times a day (TID) | ORAL | 0 refills | Status: DC

## 2017-01-02 MED ORDER — LORAZEPAM 2 MG/ML IJ SOLN
INTRAMUSCULAR | Status: AC
  Filled 2017-01-02: qty 1

## 2017-01-02 MED ORDER — CLINDAMYCIN HCL 300 MG PO CAPS
450.0000 mg | ORAL_CAPSULE | Freq: Three times a day (TID) | ORAL | Status: DC
Start: 2017-01-02 — End: 2017-01-02
  Administered 2017-01-02: 17:00:00 450 mg via ORAL
  Filled 2017-01-02 (×2): qty 1

## 2017-01-02 MED ORDER — LIDOCAINE HCL (PF) 1 % IJ SOLN
5.0000 mL | INTRAMUSCULAR | Status: DC | PRN
Start: 2017-01-02 — End: 2017-01-02

## 2017-01-02 MED ORDER — VANCOMYCIN HCL IN DEXTROSE 750-5 MG/150ML-% IV SOLN
INTRAVENOUS | Status: AC
  Administered 2017-01-02: 750 mg via INTRAVENOUS
  Filled 2017-01-02: qty 150

## 2017-01-02 MED ORDER — HEPARIN SODIUM (PORCINE) 1000 UNIT/ML DIALYSIS: 1000.0000 [IU] | INTRAMUSCULAR | Status: DC | PRN

## 2017-01-02 MED ORDER — LIDOCAINE-PRILOCAINE 2.5-2.5 % EX CREA: 1.0000 "application " | TOPICAL_CREAM | CUTANEOUS | Status: DC | PRN

## 2017-01-02 MED ORDER — DOXERCALCIFEROL 4 MCG/2ML IV SOLN
INTRAVENOUS | Status: AC
  Administered 2017-01-02: 1 ug via INTRAVENOUS
  Filled 2017-01-02: qty 2

## 2017-01-02 NOTE — Procedures (Signed)
[facility] HD, alert and appropriate, goal 4500cc, BP ok, afebrile. Jennah Satchell C

## 2017-01-02 NOTE — Clinical Social Work Note (Signed)
Patient medically stable for discharge and requested transport home by cab. Conferred with nurse and patient has cellulitis and continues to be in pain. Cab voucher provided to nurse for patient.  Jah Alarid Givens, MSW, LCSW Licensed Clinical Social Worker [place] [phone]

## 2017-01-02 NOTE — Discharge Summary (Addendum)
Physician Discharge Summary  [name]:[id number] DOB: 22-Jun-1966 DOA: 12/31/2016  PCP: Patient, [name]  Admit date: 12/31/2016 Discharge date: 01/02/2017  Admitted From: Home Disposition: Home   Recommendations for Outpatient Follow-up:  1. Follow up with PCP in 1-2 weeks 2. Continue clindamycin for total of 10 days. 3. CoNS growing in 1 of 2 blood cultures at time of discharge. Discussed with ID, this is consistent with contaminant and graft showing no signs of infection. Will defer further treatment or work up.  4. Please obtain BMP/CBC in one week  Home Health: None Equipment/Devices: None Discharge Condition: Stable CODE STATUS: Full Diet recommendation: Renal, heart healthy  Brief/Interim Summary: [name] is a 51 y.o. female with a history of ESRD, cocaine use, chronic pain syndrome, and chronic CHF who presented to the ED 5/29 with severe left anterior thigh aching pain limiting ambulation. She reportedly had a fever to 100.15F. Vancomycin and cefepime were started and hospitalists asked to admit. MRI of the area demonstrated no osteomyelitis or abscess. The induration improved with antibiotics, patient remained afebrile and she was discharged on oral antibiotics.  Discharge Diagnoses:  Principal Problem:   Cellulitis of left thigh Active Problems:   Acute on chronic combined systolic and diastolic CHF (congestive heart failure) (HCC)   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 (HCC)   Chronic pain syndrome   ESRD (end stage renal disease) (HCC)  Left thigh cellulitis/myositis: No abscess or osteo on MRI. No clinical evidence of anterior thigh compartment syndrome.  - Treated with vancomycin and cefepime > clindamycin due to concern for strep and MRSA.  - Blood cultures sent: 1 of 2 CoNS, contaminant.  ESRD: HD at [place] on TTS, missed HD due to pain 5/29. Received HD while inpatient.  Anemia of chronic disease: due to ESRD. No evidence of bleeding. -  Monitor at follow up.   HTN: Chronic, elevated at admission, improved with medications and HS. - Norvasc, coreg, clonidine, lasix    Chronic combined CHF: June 2017 echo EF 45-50%.  - Continue home medications  T2IDDM: With hyperglycemia, improving.  - Restarted home lantus, SSI  Cocaine use: +UDS on admission - Cessation counseling provided  Discharge Instructions Discharge Instructions    Discharge instructions    Complete by:  As directed    You were admitted with an infection in the skin and soft tissue of the leg. This has improved with antibiotics, so you are stable for discharge with the following recommendations:  - Continue antibiotics by taking clindamycin three times daily as directed for the next 8 days. This is very important to take.  - Continue getting routine dialysis - If your symptoms worsen, seek medical attention right away.     Allergies as of 01/02/2017      Reactions   Penicillins Hives, Other (See Comments)   Has patient had a PCN reaction causing immediate rash, facial/tongue/throat swelling, SOB or lightheadedness with hypotension: Yes Has patient had a PCN reaction causing severe rash involving mucus membranes or skin necrosis: No Has patient had a PCN reaction that required hospitalization No Has patient had a PCN reaction occurring within the last 10 years: No If all of the above answers are "NO", then may proceed with Cephalosporin use. Pt tolerates cefepime and ceftriaxone   Gabapentin Itching, Swelling   Lyrica [pregabalin] Swelling, Other (See Comments)   Facial and leg swelling   Saphris [asenapine] Swelling, Other (See Comments)   Facial swelling   Tramadol  Swelling, Other (See Comments)   Leg swelling   Other Other (See Comments)   NO BLOOD PRODUCTS. 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   amLODipine 10 MG tablet Commonly known as:  NORVASC Take 1 tablet (10 mg total) by mouth at bedtime.   atorvastatin 40 MG tablet Commonly known as:  LIPITOR Take 1 tablet (40 mg total) by mouth at bedtime.   calcium acetate 667 MG capsule Commonly known as:  PHOSLO Take 2 capsules (1,334 mg total) by mouth 3 (three) times daily with meals.   carvedilol 25 MG tablet Commonly known as:  COREG Take 1 tablet (25 mg total) by mouth 2 (two) times daily with a meal.   clindamycin 300 MG capsule Commonly known as:  CLEOCIN Take 2 capsules (600 mg total) by mouth 3 (three) times daily.   cloNIDine 0.1 MG tablet Commonly known as:  CATAPRES Take 1 tablet (0.1 mg total) by mouth every 8 (eight) hours.   doxercalciferol 4 MCG/2ML injection Commonly known as:  HECTOROL Inject 0.5 mLs (1 mcg total) into the vein Every Tuesday,Thursday,and Saturday with dialysis.   furosemide 80 MG tablet Commonly known as:  LASIX Take 1 tablet (80 mg total) by mouth 2 (two) times daily.   haloperidol 0.5 MG tablet Commonly known as:  HALDOL Take 1 tablet (0.5 mg total) by mouth every 8 (eight) hours as needed for agitation.   insulin aspart 100 UNIT/ML injection Commonly known as:  novoLOG Inject 5 Units into the skin 3 (three) times daily with meals.   insulin glargine 100 UNIT/ML injection Commonly known as:  LANTUS Inject 0.22 mLs (22 Units total) into the skin at bedtime.   lidocaine-prilocaine cream Commonly known as:  EMLA Apply 1 application topically See admin instructions. Apply to dialysis site one hour prior to dialysis   LORazepam 2 MG/ML injection Commonly known as:  ATIVAN Inject 1 mL (2 mg total) into the vein Every Tuesday,Thursday,and Saturday with dialysis.   multivitamin Tabs tablet Take 1 tablet by mouth at bedtime.   sertraline 50 MG tablet Commonly known as:  ZOLOFT Take 1 tablet (50 mg  total) by mouth daily.   traZODone 50 MG tablet Commonly known as:  DESYREL Take 0.5 tablets (25 mg total) by mouth at bedtime.       Allergies  Allergen Reactions  . Penicillins Hives and Other (See Comments)    Has patient had a PCN reaction causing immediate rash, facial/tongue/throat swelling, SOB or lightheadedness with hypotension: Yes Has patient had a PCN reaction causing severe rash involving mucus membranes or skin necrosis: No Has patient had a PCN reaction that required hospitalization No Has patient had a PCN reaction occurring within the last 10 years: No If all of the above answers are "NO", then may proceed with Cephalosporin use. Pt tolerates cefepime and ceftriaxone  . Gabapentin Itching and Swelling  . Lyrica [Pregabalin] Swelling and Other (See Comments)    Facial and leg swelling  . Saphris [Asenapine] Swelling and Other (See Comments)    Facial swelling  . Tramadol Swelling and Other (See Comments)    Leg swelling  . Other Other (See Comments)    NO BLOOD PRODUCTS. 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 Baxter Flattery, by phone 5/31  Procedures/Studies: Mr Femur Left Wo Contrast  Result Date: 01/01/2017 CLINICAL DATA:  Increasing left thigh pain and swelling for 2 days in patient with a history of end-stage renal disease and drug abuse. EXAM: MR OF THE LEFT FEMUR WITHOUT CONTRAST TECHNIQUE: Multiplanar, multisequence MR imaging of the left upper leg was performed. No intravenous contrast was administered. COMPARISON:  None. FINDINGS: Bones/Joint/Cartilage The axial and coronal sequences incidentally include the right upper leg. Bone marrow signal is normal without fracture, stress change or evidence of osteomyelitis. No avascular necrosis of the femoral heads is identified. Ligaments Negative. Muscles and Tendons Intense edema is present throughout the musculature of the anterior compartment of left upper leg. No intramuscular  fluid collection is identified. Much milder degree of edema is seen in the distal right vastus lateralis and intermedius. No muscle or tendon tear or strain is identified. Soft tissues Bilateral upper leg subcutaneous edema is much worse on the left. Small bilateral knee joint effusions are identified. IMPRESSION: Edema throughout the anterior compartment musculature of the left thigh most worrisome for rhabdomyolysis. Compartment syndrome and infectious myositis far possible but thought less likely. Much milder degree of edema is present in the right vastus lateralis and intermedius. Negative for abscess or osteomyelitis. Electronically Signed   By: Inge Rise M.D.   On: 01/01/2017 08:18   Dg Chest Portable 1 View  Result Date: 12/31/2016 CLINICAL DATA:  Acute onset of hypoxia.  Initial encounter. EXAM: PORTABLE CHEST 1 VIEW COMPARISON:  Chest radiograph performed 10/15/2016 FINDINGS: The lungs are well-aerated. Vascular congestion is noted. Hazy right basilar airspace opacity may reflect mild interstitial edema or possibly pneumonia. There is no evidence of pleural effusion or pneumothorax. The cardiomediastinal silhouette is mildly enlarged. No acute osseous abnormalities are seen. IMPRESSION: Vascular congestion and mild cardiomegaly. Hazy right basilar airspace opacity may reflect mild interstitial edema or possibly pneumonia. Electronically Signed   By: Garald Balding M.D.   On: 12/31/2016 23:46   [place] Soft Tissue Non Vascular  Result Date: 01/01/2017 CLINICAL DATA:  Assess left thigh cellulitis.  Initial encounter. EXAM: ULTRASOUND LEFT LOWER EXTREMITY LIMITED TECHNIQUE: Ultrasound examination of the lower extremity soft tissues was performed in the area of clinical concern. COMPARISON:  None. FINDINGS: Joint Space: Not assessed. Muscles: There is vague edema about the superficial musculature. Tendons: Not assessed. Other Soft Tissue Structures: Mild soft tissue edema is noted  tracking about the fat overlying the musculature of the left lower thigh. There is no evidence of hyperemia. There is no evidence of abscess. IMPRESSION: Mild soft tissue edema noted, tracking to the superficial musculature of the left lower thigh. No evidence of abscess or hyperemia. Electronically Signed   By: Garald Balding M.D.   On: 01/01/2017 02:18   Dg Femur Min 2 Views Left  Result Date: 12/31/2016 CLINICAL DATA:  Initial evaluation for recent fall, pain. EXAM: LEFT FEMUR 2 VIEWS COMPARISON:  None. FINDINGS: No acute fracture or dislocation. Limited views of the hip and knee are grossly unremarkable. No acute soft tissue abnormality. Prominent vascular calcifications noted. IMPRESSION: 1. No acute fracture or dislocation. 2. Prominent soft tissue atherosclerotic changes for patient age. Electronically Signed   By: Jeannine Boga M.D.   On: 12/31/2016 19:02   Subjective: Pain improved, able to ambulate. Eating well. No fevers.   Discharge Exam: BP (!) 146/68 (BP Location: Right Leg)   Pulse 81  Temp 99.4 F (37.4 C) (Oral)   Resp 16   Ht 5\' 4"  (1.626 m)   Wt 66.1 kg (145 lb 11.6 oz)   LMP  (LMP Unknown)   SpO2 92%   BMI 25.01 kg/m   General: Pt is alert, awake, not in acute distress Cardiovascular: RRR, S1/S2 +, no rubs, no gallops Respiratory: CTA bilaterally, no wheezing, no rhonchi Abdominal: Soft, NT, ND, bowel sounds + Ext/Skin: Left anterior thigh tender to palpation without significant erythema. Focal induration without fluctuance. Diffuse scabs consistent with skin picking. LUE AVG +bruit without erythema, warmth, discharge, or tenderness.  Labs: BNP (last 3 results)  Recent Labs  03/03/16 1228 12/31/16 2350  BNP 1,298.5* 3,220.2*   Basic Metabolic Panel:  Recent Labs Lab 12/31/16 2155 12/31/16 2350 01/02/17 0709  NA 128*  --  129*  K 4.6  --  4.6  CL 81*  --  85*  CO2 29  --  29  GLUCOSE 509*  --  62*  BUN 41*  --  54*  CREATININE 7.77*  --   8.51*  CALCIUM 8.8*  --  8.1*  MG  --  2.0  --   PHOS  --  4.2 4.9*   Liver Function Tests:  Recent Labs Lab 12/31/16 2155 01/02/17 0709  AST 25  --   ALT 18  --   ALKPHOS 120  --   BILITOT 0.9  --   PROT 7.6  --   ALBUMIN 3.0* 2.4*    Recent Labs Lab 12/31/16 2155  LIPASE 29   CBC:  Recent Labs Lab 12/31/16 2155 01/02/17 0709  WBC 3.2* 5.2  NEUTROABS 2.0  --   HGB 12.0 9.9*  HCT 36.2 30.7*  MCV 85.8 84.3  PLT 311 280   Cardiac Enzymes:  Recent Labs Lab 12/31/16 2155 12/31/16 2350  CKTOTAL 295* 242*   Urinalysis    Component Value Date/Time   COLORURINE YELLOW 01/01/2017 0054   APPEARANCEUR HAZY (A) 01/01/2017 0054   LABSPEC 1.013 01/01/2017 0054   PHURINE 8.0 01/01/2017 0054   GLUCOSEU >=500 (A) 01/01/2017 0054   HGBUR SMALL (A) 01/01/2017 0054   BILIRUBINUR NEGATIVE 01/01/2017 0054   BILIRUBINUR neg 12/13/2014 1127   KETONESUR NEGATIVE 01/01/2017 0054   PROTEINUR >=300 (A) 01/01/2017 0054   UROBILINOGEN 0.2 05/01/2015 1252   NITRITE NEGATIVE 01/01/2017 0054   LEUKOCYTESUR NEGATIVE 01/01/2017 0054    Microbiology Recent Results (from the past 240 hour(s))  Blood culture (routine x 2)     Status: None (Preliminary result)   Collection Time: 12/31/16 11:50 PM  Result Value Ref Range Status   Specimen Description BLOOD RIGHT WRIST  Final   Special Requests   Final    BOTTLES DRAWN AEROBIC AND ANAEROBIC Blood Culture adequate volume   Culture NO GROWTH 1 DAY  Final   Report Status PENDING  Incomplete  Blood culture (routine x 2)     Status: Abnormal (Preliminary result)   Collection Time: 12/31/16 11:57 PM  Result Value Ref Range Status   Specimen Description BLOOD RIGHT HAND  Final   Special Requests   Final    BOTTLES DRAWN AEROBIC AND ANAEROBIC Blood Culture adequate volume   Culture  Setup Time   Final    GRAM POSITIVE COCCI IN CLUSTERS ANAEROBIC BOTTLE ONLY Organism ID to follow CRITICAL RESULT CALLED TO, READ BACK BY AND VERIFIED  WITH: G ABBOTT PHARMD 2327 01/01/17 A BROWNING    Culture STAPHYLOCOCCUS SPECIES (COAGULASE NEGATIVE) (A)  Final   Report Status PENDING  Incomplete  Blood Culture ID Panel (Reflexed)     Status: Abnormal   Collection Time: 12/31/16 11:57 PM  Result Value Ref Range Status   Enterococcus species NOT DETECTED NOT DETECTED Final   Listeria monocytogenes NOT DETECTED NOT DETECTED Final   Staphylococcus species DETECTED (A) NOT DETECTED Final    Comment: Methicillin (oxacillin) resistant coagulase negative staphylococcus. Possible blood culture contaminant (unless isolated from more than one blood culture draw or clinical case suggests pathogenicity). No antibiotic treatment is indicated for blood  culture contaminants. CRITICAL RESULT CALLED TO, READ BACK BY AND VERIFIED WITH: G ABBOTT PHARMD 2327 01/01/17 A BROWNING    Staphylococcus aureus NOT DETECTED NOT DETECTED Final   Methicillin resistance DETECTED (A) NOT DETECTED Final    Comment: CRITICAL RESULT CALLED TO, READ BACK BY AND VERIFIED WITH: G ABBOTT PHARMD 2327 01/01/17 A BROWNING    Streptococcus species NOT DETECTED NOT DETECTED Final   Streptococcus agalactiae NOT DETECTED NOT DETECTED Final   Streptococcus pneumoniae NOT DETECTED NOT DETECTED Final   Streptococcus pyogenes NOT DETECTED NOT DETECTED Final   Acinetobacter baumannii NOT DETECTED NOT DETECTED Final   Enterobacteriaceae species NOT DETECTED NOT DETECTED Final   Enterobacter cloacae complex NOT DETECTED NOT DETECTED Final   Escherichia coli NOT DETECTED NOT DETECTED Final   Klebsiella oxytoca NOT DETECTED NOT DETECTED Final   Klebsiella pneumoniae NOT DETECTED NOT DETECTED Final   Proteus species NOT DETECTED NOT DETECTED Final   Serratia marcescens NOT DETECTED NOT DETECTED Final   Haemophilus influenzae NOT DETECTED NOT DETECTED Final   Neisseria meningitidis NOT DETECTED NOT DETECTED Final   Pseudomonas aeruginosa NOT DETECTED NOT DETECTED Final   Candida  albicans NOT DETECTED NOT DETECTED Final   Candida glabrata NOT DETECTED NOT DETECTED Final   Candida krusei NOT DETECTED NOT DETECTED Final   Candida parapsilosis NOT DETECTED NOT DETECTED Final   Candida tropicalis NOT DETECTED NOT DETECTED Final  Urine culture     Status: Abnormal   Collection Time: 01/01/17 12:54 AM  Result Value Ref Range Status   Specimen Description URINE, RANDOM  Final   Special Requests NONE  Final   Culture MULTIPLE SPECIES PRESENT, SUGGEST RECOLLECTION (A)  Final   Report Status 01/02/2017 FINAL  Final  MRSA PCR Screening     Status: Abnormal   Collection Time: 01/01/17  4:31 AM  Result Value Ref Range Status   MRSA by PCR INVALID RESULTS, SPECIMEN SENT FOR CULTURE (A) NEGATIVE Final    Comment:        The GeneXpert MRSA Assay (FDA approved for NASAL specimens only), is one component of a comprehensive MRSA colonization surveillance program. It is not intended to diagnose MRSA infection nor to guide or monitor treatment for MRSA infections. Performed at [facility], [place] [address], [place], [place] [address]   MRSA culture     Status: None (Preliminary result)   Collection Time: 01/01/17  2:00 PM  Result Value Ref Range Status   Specimen Description NOSE  Final   Special Requests   Final    NONE Performed at [facility], [place] [address] [place], [place] [address]    Culture TOO YOUNG TO READ  Final   Report Status PENDING  Incomplete    Time coordinating discharge: Approximately 40 minutes  [name], MD  [facility] 01/02/2017, 3:54 PM Pager [phone]

## 2017-01-02 NOTE — Progress Notes (Signed)
Pt discharge to home, discharge instructions, medications and follow up appointments discussed and reviewed with pt, verbalized understanding. IV dc'ed, catheter intact, site clean and dry. Pt requested for a cab voucher as no one can pick her up today, CSW notified and gave voucher to pt. Pt was escorted out of the unit in wheelchair, took all belongings with her.

## 2017-01-03 LAB — MRSA CULTURE: CULTURE: NOT DETECTED

## 2017-01-03 LAB — CULTURE, BLOOD (ROUTINE X 2): Special Requests: ADEQUATE

## 2017-01-06 LAB — CULTURE, BLOOD (ROUTINE X 2)
Culture: NO GROWTH
Special Requests: ADEQUATE

## 2017-01-16 ENCOUNTER — Encounter (HOSPITAL_COMMUNITY): Payer: Self-pay | Admitting: Emergency Medicine

## 2017-01-16 ENCOUNTER — Inpatient Hospital Stay (HOSPITAL_COMMUNITY): Payer: Medicare Other

## 2017-01-16 ENCOUNTER — Emergency Department (HOSPITAL_COMMUNITY): Payer: Medicare Other

## 2017-01-16 ENCOUNTER — Inpatient Hospital Stay (HOSPITAL_COMMUNITY)
Admission: EM | Admit: 2017-01-16 | Discharge: 2017-01-24 | DRG: 602 | Disposition: A | Discharge status: Home / Self Care | Payer: Medicare Other | Attending: Family Medicine | Admitting: Family Medicine

## 2017-01-16 DIAGNOSIS — F259 Schizoaffective disorder, unspecified: Secondary | ICD-10-CM | POA: Diagnosis present

## 2017-01-16 DIAGNOSIS — L03116 Cellulitis of left lower limb: Secondary | ICD-10-CM | POA: Diagnosis present

## 2017-01-16 DIAGNOSIS — Z87891 Personal history of nicotine dependence: Secondary | ICD-10-CM | POA: Diagnosis not present

## 2017-01-16 DIAGNOSIS — K922 Gastrointestinal hemorrhage, unspecified: Secondary | ICD-10-CM | POA: Diagnosis not present

## 2017-01-16 DIAGNOSIS — Z515 Encounter for palliative care: Secondary | ICD-10-CM | POA: Diagnosis not present

## 2017-01-16 DIAGNOSIS — F1410 Cocaine abuse, uncomplicated: Secondary | ICD-10-CM | POA: Diagnosis present

## 2017-01-16 DIAGNOSIS — Z9119 Patient's noncompliance with other medical treatment and regimen: Secondary | ICD-10-CM

## 2017-01-16 DIAGNOSIS — F419 Anxiety disorder, unspecified: Secondary | ICD-10-CM | POA: Diagnosis present

## 2017-01-16 DIAGNOSIS — E875 Hyperkalemia: Secondary | ICD-10-CM | POA: Diagnosis present

## 2017-01-16 DIAGNOSIS — I5042 Chronic combined systolic (congestive) and diastolic (congestive) heart failure: Secondary | ICD-10-CM | POA: Diagnosis present

## 2017-01-16 DIAGNOSIS — N186 End stage renal disease: Secondary | ICD-10-CM | POA: Diagnosis present

## 2017-01-16 DIAGNOSIS — B182 Chronic viral hepatitis C: Secondary | ICD-10-CM | POA: Diagnosis present

## 2017-01-16 DIAGNOSIS — K5730 Diverticulosis of large intestine without perforation or abscess without bleeding: Secondary | ICD-10-CM

## 2017-01-16 DIAGNOSIS — K644 Residual hemorrhoidal skin tags: Secondary | ICD-10-CM | POA: Diagnosis not present

## 2017-01-16 DIAGNOSIS — L0291 Cutaneous abscess, unspecified: Secondary | ICD-10-CM

## 2017-01-16 DIAGNOSIS — G894 Chronic pain syndrome: Secondary | ICD-10-CM | POA: Diagnosis present

## 2017-01-16 DIAGNOSIS — E1165 Type 2 diabetes mellitus with hyperglycemia: Secondary | ICD-10-CM | POA: Diagnosis present

## 2017-01-16 DIAGNOSIS — L02416 Cutaneous abscess of left lower limb: Principal | ICD-10-CM | POA: Diagnosis present

## 2017-01-16 DIAGNOSIS — Z8249 Family history of ischemic heart disease and other diseases of the circulatory system: Secondary | ICD-10-CM

## 2017-01-16 DIAGNOSIS — E1140 Type 2 diabetes mellitus with diabetic neuropathy, unspecified: Secondary | ICD-10-CM | POA: Diagnosis present

## 2017-01-16 DIAGNOSIS — Z7189 Other specified counseling: Secondary | ICD-10-CM

## 2017-01-16 DIAGNOSIS — Z992 Dependence on renal dialysis: Secondary | ICD-10-CM | POA: Diagnosis not present

## 2017-01-16 DIAGNOSIS — S20219A Contusion of unspecified front wall of thorax, initial encounter: Secondary | ICD-10-CM

## 2017-01-16 DIAGNOSIS — D631 Anemia in chronic kidney disease: Secondary | ICD-10-CM | POA: Diagnosis present

## 2017-01-16 DIAGNOSIS — L03119 Cellulitis of unspecified part of limb: Secondary | ICD-10-CM | POA: Diagnosis not present

## 2017-01-16 DIAGNOSIS — E785 Hyperlipidemia, unspecified: Secondary | ICD-10-CM | POA: Diagnosis present

## 2017-01-16 DIAGNOSIS — E1122 Type 2 diabetes mellitus with diabetic chronic kidney disease: Secondary | ICD-10-CM | POA: Diagnosis present

## 2017-01-16 DIAGNOSIS — Z89432 Acquired absence of left foot: Secondary | ICD-10-CM | POA: Diagnosis not present

## 2017-01-16 DIAGNOSIS — Z794 Long term (current) use of insulin: Secondary | ICD-10-CM

## 2017-01-16 DIAGNOSIS — E118 Type 2 diabetes mellitus with unspecified complications: Secondary | ICD-10-CM | POA: Diagnosis not present

## 2017-01-16 DIAGNOSIS — N2581 Secondary hyperparathyroidism of renal origin: Secondary | ICD-10-CM | POA: Diagnosis present

## 2017-01-16 DIAGNOSIS — I132 Hypertensive heart and chronic kidney disease with heart failure and with stage 5 chronic kidney disease, or end stage renal disease: Secondary | ICD-10-CM | POA: Diagnosis present

## 2017-01-16 DIAGNOSIS — D62 Acute posthemorrhagic anemia: Secondary | ICD-10-CM | POA: Diagnosis not present

## 2017-01-16 DIAGNOSIS — E7800 Pure hypercholesterolemia, unspecified: Secondary | ICD-10-CM | POA: Diagnosis present

## 2017-01-16 DIAGNOSIS — K625 Hemorrhage of anus and rectum: Secondary | ICD-10-CM | POA: Diagnosis not present

## 2017-01-16 DIAGNOSIS — Z79899 Other long term (current) drug therapy: Secondary | ICD-10-CM

## 2017-01-16 DIAGNOSIS — M79605 Pain in left leg: Secondary | ICD-10-CM

## 2017-01-16 DIAGNOSIS — Z880 Allergy status to penicillin: Secondary | ICD-10-CM

## 2017-01-16 DIAGNOSIS — N189 Chronic kidney disease, unspecified: Secondary | ICD-10-CM | POA: Diagnosis not present

## 2017-01-16 DIAGNOSIS — R52 Pain, unspecified: Secondary | ICD-10-CM

## 2017-01-16 DIAGNOSIS — E861 Hypovolemia: Secondary | ICD-10-CM | POA: Diagnosis present

## 2017-01-16 DIAGNOSIS — Z888 Allergy status to other drugs, medicaments and biological substances status: Secondary | ICD-10-CM

## 2017-01-16 DIAGNOSIS — M109 Gout, unspecified: Secondary | ICD-10-CM | POA: Diagnosis present

## 2017-01-16 DIAGNOSIS — L02419 Cutaneous abscess of limb, unspecified: Secondary | ICD-10-CM | POA: Diagnosis not present

## 2017-01-16 LAB — CBC WITH DIFFERENTIAL/PLATELET
BASOS PCT: 1 %
Basophils Absolute: 0.1 10*3/uL (ref 0.0–0.1)
Eosinophils Absolute: 0.1 10*3/uL (ref 0.0–0.7)
Eosinophils Relative: 0 %
HEMATOCRIT: 20.6 % — AB (ref 36.0–46.0)
HEMOGLOBIN: 6.6 g/dL — AB (ref 12.0–15.0)
LYMPHS ABS: 1.2 10*3/uL (ref 0.7–4.0)
Lymphocytes Relative: 10 %
MCH: 27.7 pg (ref 26.0–34.0)
MCHC: 32 g/dL (ref 30.0–36.0)
MCV: 86.6 fL (ref 78.0–100.0)
MONOS PCT: 3 %
Monocytes Absolute: 0.4 10*3/uL (ref 0.1–1.0)
NEUTROS ABS: 10.4 10*3/uL — AB (ref 1.7–7.7)
NEUTROS PCT: 86 %
Platelets: 304 10*3/uL (ref 150–400)
RBC: 2.38 MIL/uL — AB (ref 3.87–5.11)
RDW: 18.6 % — ABNORMAL HIGH (ref 11.5–15.5)
WBC: 12.1 10*3/uL — AB (ref 4.0–10.5)

## 2017-01-16 LAB — RENAL FUNCTION PANEL
Albumin: 2.3 g/dL — ABNORMAL LOW (ref 3.5–5.0)
Anion gap: 10 (ref 5–15)
BUN: 30 mg/dL — ABNORMAL HIGH (ref 6–20)
CO2: 26 mmol/L (ref 22–32)
Calcium: 8.2 mg/dL — ABNORMAL LOW (ref 8.9–10.3)
Chloride: 92 mmol/L — ABNORMAL LOW (ref 101–111)
Creatinine, Ser: 4.4 mg/dL — ABNORMAL HIGH (ref 0.44–1.00)
GFR calc Af Amer: 12 mL/min — ABNORMAL LOW (ref >60)
GFR calc non Af Amer: 11 mL/min — ABNORMAL LOW (ref >60)
Glucose, Bld: 193 mg/dL — ABNORMAL HIGH (ref 65–99)
Phosphorus: 2.7 mg/dL (ref 2.5–4.6)
Potassium: 4.6 mmol/L (ref 3.5–5.1)
Sodium: 128 mmol/L — ABNORMAL LOW (ref 135–145)

## 2017-01-16 LAB — CBC
HCT: 18.3 % — ABNORMAL LOW (ref 36.0–46.0)
Hemoglobin: 5.8 g/dL — CL (ref 12.0–15.0)
MCH: 27.4 pg (ref 26.0–34.0)
MCHC: 31.7 g/dL (ref 30.0–36.0)
MCV: 86.3 fL (ref 78.0–100.0)
Platelets: 263 K/uL (ref 150–400)
RBC: 2.12 MIL/uL — ABNORMAL LOW (ref 3.87–5.11)
RDW: 18.8 % — ABNORMAL HIGH (ref 11.5–15.5)
WBC: 8.9 K/uL (ref 4.0–10.5)

## 2017-01-16 LAB — COMPREHENSIVE METABOLIC PANEL
ALBUMIN: 2.6 g/dL — AB (ref 3.5–5.0)
ALK PHOS: 111 U/L (ref 38–126)
ALT: 19 U/L (ref 14–54)
ANION GAP: 13 (ref 5–15)
AST: 26 U/L (ref 15–41)
BILIRUBIN TOTAL: 0.8 mg/dL (ref 0.3–1.2)
BUN: 36 mg/dL — ABNORMAL HIGH (ref 6–20)
CALCIUM: 8.8 mg/dL — AB (ref 8.9–10.3)
CO2: 26 mmol/L (ref 22–32)
CREATININE: 5.55 mg/dL — AB (ref 0.44–1.00)
Chloride: 88 mmol/L — ABNORMAL LOW (ref 101–111)
GFR calc Af Amer: 9 mL/min — ABNORMAL LOW (ref >60)
GFR calc non Af Amer: 8 mL/min — ABNORMAL LOW (ref >60)
GLUCOSE: 340 mg/dL — AB (ref 65–99)
Potassium: 5.9 mmol/L — ABNORMAL HIGH (ref 3.5–5.1)
SODIUM: 127 mmol/L — AB (ref 135–145)
TOTAL PROTEIN: 7.8 g/dL (ref 6.5–8.1)

## 2017-01-16 LAB — SEDIMENTATION RATE: Sed Rate: 100 mm/hr — ABNORMAL HIGH (ref 0–22)

## 2017-01-16 LAB — RETICULOCYTES
RBC.: 2.41 MIL/uL — AB (ref 3.87–5.11)
RETIC COUNT ABSOLUTE: 98.8 10*3/uL (ref 19.0–186.0)
Retic Ct Pct: 4.1 % — ABNORMAL HIGH (ref 0.4–3.1)

## 2017-01-16 LAB — HEPATIC FUNCTION PANEL
ALBUMIN: 2.6 g/dL — AB (ref 3.5–5.0)
ALK PHOS: 107 U/L (ref 38–126)
ALT: 18 U/L (ref 14–54)
AST: 26 U/L (ref 15–41)
Bilirubin, Direct: 0.2 mg/dL (ref 0.1–0.5)
Indirect Bilirubin: 0.7 mg/dL (ref 0.3–0.9)
TOTAL PROTEIN: 8.2 g/dL — AB (ref 6.5–8.1)
Total Bilirubin: 0.9 mg/dL (ref 0.3–1.2)

## 2017-01-16 LAB — VITAMIN B12: Vitamin B-12: 1893 pg/mL — ABNORMAL HIGH (ref 180–914)

## 2017-01-16 LAB — PROTIME-INR
INR: 1.29
Prothrombin Time: 16.2 seconds — ABNORMAL HIGH (ref 11.4–15.2)

## 2017-01-16 LAB — IRON AND TIBC
IRON: 36 ug/dL (ref 28–170)
Saturation Ratios: 17 % (ref 10.4–31.8)
TIBC: 206 ug/dL — ABNORMAL LOW (ref 250–450)
UIBC: 170 ug/dL

## 2017-01-16 LAB — CBG MONITORING, ED: Glucose-Capillary: 383 mg/dL — ABNORMAL HIGH (ref 65–99)

## 2017-01-16 LAB — GLUCOSE, CAPILLARY: GLUCOSE-CAPILLARY: 222 mg/dL — AB (ref 65–99)

## 2017-01-16 LAB — PHOSPHORUS: PHOSPHORUS: 3.7 mg/dL (ref 2.5–4.6)

## 2017-01-16 LAB — FOLATE: Folate: 34 ng/mL (ref >5.9)

## 2017-01-16 LAB — I-STAT CG4 LACTIC ACID, ED: Lactic Acid, Venous: 0.82 mmol/L (ref 0.5–1.9)

## 2017-01-16 LAB — FERRITIN: FERRITIN: 776 ng/mL — AB (ref 11–307)

## 2017-01-16 LAB — CK: Total CK: 158 U/L (ref 38–234)

## 2017-01-16 LAB — C-REACTIVE PROTEIN: CRP: 3.5 mg/dL — AB (ref <1.0)

## 2017-01-16 MED ORDER — CARVEDILOL 25 MG PO TABS
25.0000 mg | ORAL_TABLET | Freq: Two times a day (BID) | ORAL | Status: DC
  Administered 2017-01-17 – 2017-01-24 (×13): 25 mg via ORAL
  Filled 2017-01-16 (×13): qty 1

## 2017-01-16 MED ORDER — PANTOPRAZOLE SODIUM 40 MG IV SOLR: 40.0000 mg | Freq: Two times a day (BID) | INTRAVENOUS | Status: DC

## 2017-01-16 MED ORDER — VANCOMYCIN HCL IN DEXTROSE 750-5 MG/150ML-% IV SOLN: 750.0000 mg | INTRAVENOUS | Status: DC

## 2017-01-16 MED ORDER — SODIUM CHLORIDE 0.9 % IV SOLN
125.0000 mg | INTRAVENOUS | Status: DC
  Administered 2017-01-18 – 2017-01-23 (×3): 125 mg via INTRAVENOUS
  Filled 2017-01-16 (×6): qty 10

## 2017-01-16 MED ORDER — SODIUM CHLORIDE 0.9% FLUSH
3.0000 mL | Freq: Two times a day (BID) | INTRAVENOUS | Status: DC
  Administered 2017-01-16 – 2017-01-22 (×10): 3 mL via INTRAVENOUS

## 2017-01-16 MED ORDER — LIDOCAINE-PRILOCAINE 2.5-2.5 % EX CREA: 1.0000 "application " | TOPICAL_CREAM | CUTANEOUS | Status: DC

## 2017-01-16 MED ORDER — PENTAFLUOROPROP-TETRAFLUOROETH EX AERO: 1.0000 "application " | INHALATION_SPRAY | CUTANEOUS | Status: DC | PRN

## 2017-01-16 MED ORDER — SODIUM CHLORIDE 0.9% FLUSH: 3.0000 mL | INTRAVENOUS | Status: DC | PRN

## 2017-01-16 MED ORDER — PANTOPRAZOLE SODIUM 40 MG IV SOLR
8.0000 mg/h | INTRAVENOUS | Status: DC
  Administered 2017-01-16 – 2017-01-17 (×2): 8 mg/h via INTRAVENOUS
  Filled 2017-01-16 (×8): qty 80

## 2017-01-16 MED ORDER — DOXERCALCIFEROL 4 MCG/2ML IV SOLN
1.0000 ug | INTRAVENOUS | Status: DC
  Administered 2017-01-18 – 2017-01-23 (×3): 1 ug via INTRAVENOUS
  Filled 2017-01-16 (×3): qty 2

## 2017-01-16 MED ORDER — MORPHINE SULFATE (PF) 4 MG/ML IV SOLN
4.0000 mg | INTRAVENOUS | Status: DC | PRN
  Administered 2017-01-16: 4 mg via INTRAVENOUS

## 2017-01-16 MED ORDER — DEXTROSE 5 % IV SOLN
2.0000 g | INTRAVENOUS | Status: DC
  Administered 2017-01-18 – 2017-01-21 (×2): 2 g via INTRAVENOUS
  Filled 2017-01-16 (×2): qty 2

## 2017-01-16 MED ORDER — CALCIUM ACETATE (PHOS BINDER) 667 MG PO CAPS
1334.0000 mg | ORAL_CAPSULE | Freq: Three times a day (TID) | ORAL | Status: DC
  Administered 2017-01-17 – 2017-01-24 (×13): 1334 mg via ORAL
  Filled 2017-01-16 (×18): qty 2

## 2017-01-16 MED ORDER — HEPARIN SODIUM (PORCINE) 1000 UNIT/ML DIALYSIS: 1000.0000 [IU] | INTRAMUSCULAR | Status: DC | PRN

## 2017-01-16 MED ORDER — MORPHINE SULFATE (PF) 4 MG/ML IV SOLN
INTRAVENOUS | Status: AC
  Administered 2017-01-16: 4 mg via INTRAVENOUS
  Filled 2017-01-16: qty 1

## 2017-01-16 MED ORDER — VANCOMYCIN HCL IN DEXTROSE 750-5 MG/150ML-% IV SOLN
INTRAVENOUS | Status: AC
  Administered 2017-01-16: 750 mg via INTRAVENOUS
  Filled 2017-01-16: qty 150

## 2017-01-16 MED ORDER — INSULIN ASPART 100 UNIT/ML ~~LOC~~ SOLN
0.0000 [IU] | Freq: Every day | SUBCUTANEOUS | Status: DC
  Administered 2017-01-16 – 2017-01-18 (×2): 2 [IU] via SUBCUTANEOUS

## 2017-01-16 MED ORDER — INSULIN ASPART 100 UNIT/ML ~~LOC~~ SOLN
0.0000 [IU] | Freq: Three times a day (TID) | SUBCUTANEOUS | Status: DC
  Administered 2017-01-17: 8 [IU] via SUBCUTANEOUS
  Administered 2017-01-18: 3 [IU] via SUBCUTANEOUS
  Administered 2017-01-19: 15 [IU] via SUBCUTANEOUS
  Administered 2017-01-19: 5 [IU] via SUBCUTANEOUS
  Administered 2017-01-19: 3 [IU] via SUBCUTANEOUS
  Administered 2017-01-20: 5 [IU] via SUBCUTANEOUS
  Administered 2017-01-21: 1 [IU] via SUBCUTANEOUS
  Administered 2017-01-22 (×3): 3 [IU] via SUBCUTANEOUS
  Administered 2017-01-23: 5 [IU] via SUBCUTANEOUS
  Administered 2017-01-23: 2 [IU] via SUBCUTANEOUS
  Administered 2017-01-24: 5 [IU] via SUBCUTANEOUS
  Administered 2017-01-24: 3 [IU] via SUBCUTANEOUS
  Administered 2017-01-24: 2 [IU] via SUBCUTANEOUS

## 2017-01-16 MED ORDER — TRAZODONE HCL 50 MG PO TABS
25.0000 mg | ORAL_TABLET | Freq: Every day | ORAL | Status: DC
  Administered 2017-01-16 – 2017-01-23 (×7): 25 mg via ORAL
  Filled 2017-01-16 (×8): qty 1

## 2017-01-16 MED ORDER — ACETAMINOPHEN 325 MG PO TABS
650.0000 mg | ORAL_TABLET | Freq: Four times a day (QID) | ORAL | Status: DC | PRN
  Administered 2017-01-17 – 2017-01-21 (×3): 650 mg via ORAL
  Filled 2017-01-16 (×4): qty 2

## 2017-01-16 MED ORDER — MORPHINE SULFATE (PF) 4 MG/ML IV SOLN
4.0000 mg | INTRAVENOUS | Status: AC | PRN
  Administered 2017-01-16 (×3): 4 mg via INTRAVENOUS
  Filled 2017-01-16 (×2): qty 1

## 2017-01-16 MED ORDER — AMLODIPINE BESYLATE 10 MG PO TABS
10.0000 mg | ORAL_TABLET | Freq: Every day | ORAL | Status: DC
  Administered 2017-01-16 – 2017-01-23 (×7): 10 mg via ORAL
  Filled 2017-01-16 (×10): qty 1

## 2017-01-16 MED ORDER — PANTOPRAZOLE SODIUM 40 MG IV SOLR: 40.0000 mg | Freq: Once | INTRAVENOUS | Status: DC

## 2017-01-16 MED ORDER — SODIUM CHLORIDE 0.9 % IV SOLN: 100.0000 mL | INTRAVENOUS | Status: DC | PRN

## 2017-01-16 MED ORDER — CLONIDINE HCL 0.1 MG PO TABS
0.1000 mg | ORAL_TABLET | Freq: Three times a day (TID) | ORAL | Status: DC
  Administered 2017-01-16 – 2017-01-24 (×18): 0.1 mg via ORAL
  Filled 2017-01-16 (×21): qty 1

## 2017-01-16 MED ORDER — VANCOMYCIN HCL IN DEXTROSE 750-5 MG/150ML-% IV SOLN
750.0000 mg | INTRAVENOUS | Status: DC
  Administered 2017-01-16 – 2017-01-21 (×3): 750 mg via INTRAVENOUS
  Filled 2017-01-16 (×2): qty 150

## 2017-01-16 MED ORDER — DEXTROSE 5 % IV SOLN
2.0000 g | Freq: Once | INTRAVENOUS | Status: AC
  Administered 2017-01-16: 2 g via INTRAVENOUS
  Filled 2017-01-16: qty 2

## 2017-01-16 MED ORDER — ONDANSETRON HCL 4 MG/2ML IJ SOLN
4.0000 mg | Freq: Once | INTRAMUSCULAR | Status: AC
  Administered 2017-01-16: 4 mg via INTRAVENOUS
  Filled 2017-01-16: qty 2

## 2017-01-16 MED ORDER — HALOPERIDOL 0.5 MG PO TABS
0.5000 mg | ORAL_TABLET | Freq: Every day | ORAL | Status: DC
  Administered 2017-01-16 – 2017-01-23 (×7): 0.5 mg via ORAL
  Filled 2017-01-16 (×10): qty 1

## 2017-01-16 MED ORDER — LIDOCAINE HCL (PF) 1 % IJ SOLN: 5.0000 mL | INTRAMUSCULAR | Status: DC | PRN

## 2017-01-16 MED ORDER — VANCOMYCIN HCL IN DEXTROSE 1-5 GM/200ML-% IV SOLN
1000.0000 mg | Freq: Once | INTRAVENOUS | Status: AC
  Administered 2017-01-16: 1000 mg via INTRAVENOUS
  Filled 2017-01-16: qty 200

## 2017-01-16 MED ORDER — SODIUM CHLORIDE 0.9% FLUSH
3.0000 mL | Freq: Two times a day (BID) | INTRAVENOUS | Status: DC
  Administered 2017-01-17 – 2017-01-24 (×8): 3 mL via INTRAVENOUS

## 2017-01-16 MED ORDER — VANCOMYCIN HCL IN DEXTROSE 1-5 GM/200ML-% IV SOLN: 1000.0000 mg | Freq: Once | INTRAVENOUS | Status: DC

## 2017-01-16 MED ORDER — ACETAMINOPHEN 650 MG RE SUPP
650.0000 mg | Freq: Four times a day (QID) | RECTAL | Status: DC | PRN
Start: 2017-01-16 — End: 2017-01-23

## 2017-01-16 MED ORDER — RENA-VITE PO TABS
1.0000 | ORAL_TABLET | Freq: Every day | ORAL | Status: DC
  Administered 2017-01-16 – 2017-01-23 (×7): 1 via ORAL
  Filled 2017-01-16 (×9): qty 1

## 2017-01-16 MED ORDER — INSULIN ASPART 100 UNIT/ML ~~LOC~~ SOLN
10.0000 [IU] | Freq: Once | SUBCUTANEOUS | Status: AC
  Administered 2017-01-16: 10 [IU] via SUBCUTANEOUS
  Filled 2017-01-16: qty 1

## 2017-01-16 MED ORDER — DARBEPOETIN ALFA 200 MCG/0.4ML IJ SOSY
200.0000 ug | PREFILLED_SYRINGE | INTRAMUSCULAR | Status: DC
  Administered 2017-01-16 – 2017-01-23 (×2): 200 ug via INTRAVENOUS
  Filled 2017-01-16: qty 0.4

## 2017-01-16 MED ORDER — LIDOCAINE-PRILOCAINE 2.5-2.5 % EX CREA: 1.0000 "application " | TOPICAL_CREAM | CUTANEOUS | Status: DC | PRN

## 2017-01-16 MED ORDER — LORAZEPAM 2 MG/ML IJ SOLN
2.0000 mg | INTRAMUSCULAR | Status: DC
  Administered 2017-01-18 – 2017-01-23 (×3): 2 mg via INTRAVENOUS
  Filled 2017-01-16: qty 1

## 2017-01-16 MED ORDER — SERTRALINE HCL 50 MG PO TABS
50.0000 mg | ORAL_TABLET | Freq: Every day | ORAL | Status: DC
  Administered 2017-01-17 – 2017-01-24 (×8): 50 mg via ORAL
  Filled 2017-01-16 (×9): qty 1

## 2017-01-16 MED ORDER — ALBUTEROL SULFATE (2.5 MG/3ML) 0.083% IN NEBU: 3.0000 mL | INHALATION_SOLUTION | RESPIRATORY_TRACT | Status: DC | PRN

## 2017-01-16 MED ORDER — SODIUM CHLORIDE 0.9 % IV SOLN: 250.0000 mL | INTRAVENOUS | Status: DC | PRN

## 2017-01-16 MED ORDER — SODIUM CHLORIDE 0.9 % IV SOLN
80.0000 mg | Freq: Once | INTRAVENOUS | Status: AC
  Administered 2017-01-16: 80 mg via INTRAVENOUS
  Filled 2017-01-16: qty 80

## 2017-01-16 MED ORDER — ATORVASTATIN CALCIUM 40 MG PO TABS
40.0000 mg | ORAL_TABLET | Freq: Every day | ORAL | Status: DC
  Administered 2017-01-16 – 2017-01-23 (×7): 40 mg via ORAL
  Filled 2017-01-16 (×9): qty 1

## 2017-01-16 MED ORDER — ALTEPLASE 2 MG IJ SOLR: 2.0000 mg | Freq: Once | INTRAMUSCULAR | Status: DC | PRN

## 2017-01-16 MED ORDER — DARBEPOETIN ALFA 200 MCG/0.4ML IJ SOSY
PREFILLED_SYRINGE | INTRAMUSCULAR | Status: AC
  Filled 2017-01-16: qty 0.4

## 2017-01-16 NOTE — ED Notes (Signed)
Critical care results given to Dr. Jeneen Rinks

## 2017-01-16 NOTE — ED Notes (Signed)
ED Provider at bedside. 

## 2017-01-16 NOTE — Consult Note (Signed)
[facility] Renal Consultation Note    Indication for Consultation:  Management of ESRD/hemodialysis; anemia, hypertension/volume and secondary hyperparathyroidism PCP: Doesn't have PCP on file Nephrologist: Dr. Marval Regal  HPI: [name] is a 51 y.o. female with ESRD on hemodialysis T,Th,S at [facility]. 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 She is a Jehovah Witness and usually refuses blood transfusions.   She recently developed pain/swelling L thigh. MRI done 01/01/2017 which, per report, showed edema throughout the anterior compartment musculature of the left thigh most worrisome for rhabdomyolysis, negative for osteomyelitis and abscess. Patient was treated at empirically with vanc/maxipime as in-patient then [facility] home on oral clindamycin. Patient reports that pain in L thigh has been so severe that she has resorted to street drugs for pain control and had appointment with hospice because it hurts so badly that she doesn't want to live anymore. She came to ED today for evaluation of L thigh and HGB at HD unit of 6.2 01/11/17 down from 9.5 on 01/07/17 . She has been taking Goody-powders for pain and said she developed tarry stools about 2-3 weeks ago. Consequently HGB is currently 6.6. K+ 5.9 Na 127 WBC 12.1 PLT 304 CK total 158. She has been started on Vanc/maxipime and PPI. GI and palliative care consulted.   Patient C/O L lateral thigh exquisitely tender, warm to touch, swelling noted mid lateral thigh. This is her chief complaint. She denies weakness, chest pain, SOB, fever, chills, N, V,D. + tarry/bloody stools of which she is not concerned about-still refuses blood transfusions. Denies headaches, vision or hearing  changes, falls or trauma. She denies suicidal ideations at present, just says she has been hurting so bad, life seems unbearable. Patient very candid about mental illness and substance abuse and believes that no one will help her because "they think I just want to get high!" Admits she was going to pain clinic and tested positive for cocaine but says she needed extra drugs because she was hurting so badly. Patient having some flight of ideas but cooperative and pleasant.   Last Hemodialysis 01/14/17 ran 3.5 hours of 4 hour treatment. Has been doing better staying for treatments but still misses treatments. Usually has high IDWG getting to EDW toward end of week.    Past Medical History:  Diagnosis Date  . Anemia   . Anxiety   . Bipolar 1 disorder ([place])   . Bipolar disorder, unspecified ([place]) 02/06/2014  . Chronic combined systolic and diastolic CHF (congestive heart failure) (HCC)    EF 40% by echo and 48% by stress test  . Chronic pain syndrome   . Depression   . ESRD on hemodialysis ([place])   . GERD (gastroesophageal reflux disease)   . Glaucoma   . Headache    sinus headaches  . Hepatitis C   . High cholesterol   . History of hiatal hernia   . History of vitamin D deficiency 06/20/2015  . Hypertension   . MRSA infection ?2014   "on the back of my head; spread throughout my bloodstream"  . Neuropathy   . Refusal of blood transfusions as patient is Jehovah's Witness   . Schizoaffective disorder, unspecified condition 02/08/2014  . Seizure [facility]) dx'd 2014   "don't know what kind; last one was ~ 04/2014"  .  Stroke [facility])    TIA 2009  . TIA (transient ischemic attack) 2010  . Type II diabetes mellitus ([place])    INSULIN DEPENDENT   Past Surgical History:  Procedure Laterality Date  . ABDOMINAL HYSTERECTOMY  2014  . ABDOMINAL HYSTERECTOMY  2010  . AMPUTATION Left 05/21/2013   Procedure: Left Midfoot AMPUTATION;  Surgeon: Newt Minion, MD;  Location: [place];  Service: Orthopedics;   Laterality: Left;  Left Midfoot Amputation  . AV FISTULA PLACEMENT Left 05/04/2015   Procedure: ARTERIOVENOUS (AV) GRAFT INSERTION;  Surgeon: Angelia Mould, MD;  Location: [place];  Service: Vascular;  Laterality: Left;  . COLONOSCOPY WITH PROPOFOL N/A 06/22/2013   Procedure: COLONOSCOPY WITH PROPOFOL;  Surgeon: Jeryl Columbia, MD;  Location: [facility] ENDOSCOPY;  Service: Endoscopy;  Laterality: N/A;  . ESOPHAGOGASTRODUODENOSCOPY N/A 05/11/2015   Procedure: ESOPHAGOGASTRODUODENOSCOPY (EGD);  Surgeon: Laurence Spates, MD;  Location: [facility] ENDOSCOPY;  Service: Endoscopy;  Laterality: N/A;  . EYE SURGERY Bilateral 2010   Lasik  . FOOT AMPUTATION Left    due diabetic neuropathy, could not feel ulcer on bottom of foot  . REFRACTIVE SURGERY Bilateral 2013  . TONSILLECTOMY  1970's  . TUBAL LIGATION    . TUBAL LIGATION  2000   Family History  Problem Relation Age of Onset  . Hypertension Mother   . Hypertension Father    Social History:  reports that she quit smoking about 21 months ago. Her smoking use included Cigarettes. She has a 20.00 pack-year smoking history. She has never used smokeless tobacco. She reports that she drinks about 1.8 oz of alcohol per week . She reports that she uses drugs, including "Crack" cocaine and Cocaine. Allergies  Allergen Reactions  . Penicillins Hives and Other (See Comments)    Has patient had a PCN reaction causing immediate rash, facial/tongue/throat swelling, SOB or lightheadedness with hypotension: Yes Has patient had a PCN reaction causing severe rash involving mucus membranes or skin necrosis: No Has patient had a PCN reaction that required hospitalization No Has patient had a PCN reaction occurring within the last 10 years: No If all of the above answers are "NO", then may proceed with Cephalosporin use. Pt tolerates cefepime and ceftriaxone  . Gabapentin Itching and Swelling  . Lyrica [Pregabalin] Swelling and Other (See Comments)    Facial and leg  swelling  . Saphris [Asenapine] Swelling and Other (See Comments)    Facial swelling  . Tramadol Swelling and Other (See Comments)    Leg swelling  . Other Other (See Comments)    NO BLOOD PRODUCTS. PT IS A JEHOVAH WITNESS   Prior to Admission medications   Medication Sig Start Date End Date Taking? Authorizing Provider  albuterol (PROVENTIL HFA;VENTOLIN HFA) 108 (90 Base) MCG/ACT inhaler Inhale 2 puffs into the lungs every 4 (four) hours as needed for wheezing or shortness of breath. 10/15/16  Yes Horton, Barbette Hair, MD  amLODipine (NORVASC) 10 MG tablet Take 1 tablet (10 mg total) by mouth at bedtime. 09/02/16  Yes Hildred Priest, MD  atorvastatin (LIPITOR) 40 MG tablet Take 1 tablet (40 mg total) by mouth at bedtime. 09/02/16  Yes Hildred Priest, MD  calcium acetate (PHOSLO) 667 MG capsule Take 2 capsules (1,334 mg total) by mouth 3 (three) times daily with meals. 09/02/16  Yes Hildred Priest, MD  carvedilol (COREG) 25 MG tablet Take 1 tablet (25 mg total) by mouth 2 (two) times daily with a meal. 09/02/16  Yes Hildred Priest, MD  cloNIDine (CATAPRES)  0.1 MG tablet Take 1 tablet (0.1 mg total) by mouth every 8 (eight) hours. 09/02/16  Yes Hildred Priest, MD  furosemide (LASIX) 80 MG tablet Take 1 tablet (80 mg total) by mouth 2 (two) times daily. Patient taking differently: Take 80 mg by mouth 2 (two) times daily as needed for fluid or edema.  09/02/16  Yes Hildred Priest, MD  haloperidol (HALDOL) 0.5 MG tablet Take 1 tablet (0.5 mg total) by mouth every 8 (eight) hours as needed for agitation. Patient taking differently: Take 0.5 mg by mouth at bedtime.  09/03/16  Yes Hildred Priest, MD  insulin aspart (NOVOLOG) 100 UNIT/ML injection Inject 5 Units into the skin 3 (three) times daily with meals. 09/03/16  Yes Hildred Priest, MD  insulin glargine (LANTUS) 100 UNIT/ML injection Inject 0.22 mLs (22 Units  total) into the skin at bedtime. 09/03/16  Yes Hildred Priest, MD  multivitamin (RENA-VIT) TABS tablet Take 1 tablet by mouth at bedtime. 09/02/16  Yes Hildred Priest, MD  sertraline (ZOLOFT) 50 MG tablet Take 1 tablet (50 mg total) by mouth daily. 09/02/16  Yes Hildred Priest, MD  traZODone (DESYREL) 50 MG tablet Take 0.5 tablets (25 mg total) by mouth at bedtime. 09/02/16  Yes Hildred Priest, MD  doxercalciferol (HECTOROL) 4 MCG/2ML injection Inject 0.5 mLs (1 mcg total) into the vein Every Tuesday,Thursday,and Saturday with dialysis. 09/03/16   Hildred Priest, MD  lidocaine-prilocaine (EMLA) cream Apply 1 application topically See admin instructions. Apply to dialysis site one hour prior to dialysis    [provider]  LORazepam (ATIVAN) 2 MG/ML injection Inject 1 mL (2 mg total) into the vein Every Tuesday,Thursday,and Saturday with dialysis. 09/03/16   Hildred Priest, MD   Current Facility-Administered Medications  Medication Dose Route Frequency Provider Last Rate Last Dose  . morphine 4 MG/ML injection 4 mg  4 mg Intravenous Q1H PRN Tanna Furry, MD   4 mg at 01/16/17 1050  . pantoprazole (PROTONIX) 80 mg in sodium chloride 0.9 % 250 mL (0.32 mg/mL) infusion  8 mg/hr Intravenous Continuous Tanna Furry, MD 25 mL/hr at 01/16/17 1140 8 mg/hr at 01/16/17 1140  . [START ON 01/19/2017] pantoprazole (PROTONIX) injection 40 mg  40 mg Intravenous Q12H Tanna Furry, MD       Current Outpatient Prescriptions  Medication Sig Dispense Refill  . albuterol (PROVENTIL HFA;VENTOLIN HFA) 108 (90 Base) MCG/ACT inhaler Inhale 2 puffs into the lungs every 4 (four) hours as needed for wheezing or shortness of breath. 1 Inhaler 0  . amLODipine (NORVASC) 10 MG tablet Take 1 tablet (10 mg total) by mouth at bedtime. 30 tablet 0  . atorvastatin (LIPITOR) 40 MG tablet Take 1 tablet (40 mg total) by mouth at bedtime. 30 tablet 0  . calcium acetate  (PHOSLO) 667 MG capsule Take 2 capsules (1,334 mg total) by mouth 3 (three) times daily with meals. 240 capsule 0  . carvedilol (COREG) 25 MG tablet Take 1 tablet (25 mg total) by mouth 2 (two) times daily with a meal. 60 tablet 0  . cloNIDine (CATAPRES) 0.1 MG tablet Take 1 tablet (0.1 mg total) by mouth every 8 (eight) hours. 90 tablet 11  . furosemide (LASIX) 80 MG tablet Take 1 tablet (80 mg total) by mouth 2 (two) times daily. (Patient taking differently: Take 80 mg by mouth 2 (two) times daily as needed for fluid or edema. ) 60 tablet 0  . haloperidol (HALDOL) 0.5 MG tablet Take 1 tablet (0.5 mg total) by mouth every 8 (  eight) hours as needed for agitation. (Patient taking differently: Take 0.5 mg by mouth at bedtime. ) 30 tablet 0  . insulin aspart (NOVOLOG) 100 UNIT/ML injection Inject 5 Units into the skin 3 (three) times daily with meals. 5 mL 0  . insulin glargine (LANTUS) 100 UNIT/ML injection Inject 0.22 mLs (22 Units total) into the skin at bedtime. 7 mL 0  . multivitamin (RENA-VIT) TABS tablet Take 1 tablet by mouth at bedtime. 30 tablet 0  . sertraline (ZOLOFT) 50 MG tablet Take 1 tablet (50 mg total) by mouth daily. 30 tablet 0  . traZODone (DESYREL) 50 MG tablet Take 0.5 tablets (25 mg total) by mouth at bedtime. 30 tablet 0  . doxercalciferol (HECTOROL) 4 MCG/2ML injection Inject 0.5 mLs (1 mcg total) into the vein Every Tuesday,Thursday,and Saturday with dialysis. 7 mL 0  . lidocaine-prilocaine (EMLA) cream Apply 1 application topically See admin instructions. Apply to dialysis site one hour prior to dialysis    . LORazepam (ATIVAN) 2 MG/ML injection Inject 1 mL (2 mg total) into the vein Every Tuesday,Thursday,and Saturday with dialysis. 13 mL 0   Labs: Basic Metabolic Panel:  Recent Labs Lab 01/16/17 1007  NA 127*  K 5.9*  CL 88*  CO2 26  GLUCOSE 340*  BUN 36*  CREATININE 5.55*  CALCIUM 8.8*   Liver Function Tests:  Recent Labs Lab 01/16/17 1007  AST 26  ALT  19  ALKPHOS 111  BILITOT 0.8  PROT 7.8  ALBUMIN 2.6*   CBC:  Recent Labs Lab 01/16/17 1007  WBC 12.1*  NEUTROABS 10.4*  HGB 6.6*  HCT 20.6*  MCV 86.6  PLT 304   Cardiac Enzymes:  Recent Labs Lab 01/16/17 1007  CKTOTAL 158   CBG:  Recent Labs Lab 01/16/17 1222  GLUCAP 383*    Studies/Results: Dg Femur Min 2 Views Left  Result Date: 01/16/2017 CLINICAL DATA:  Pain, soft tissue mass, ongoing for 2 weeks EXAM: LEFT FEMUR 2 VIEWS COMPARISON:  None. FINDINGS: No fracture or dislocation. No aggressive lytic or sclerotic osseous lesion. Peripheral vascular atherosclerotic disease. No focal soft tissue abnormality. IMPRESSION: No acute osseous injury of the left femur. If there is persisting concern regarding soft tissue abnormality recommend evaluation with MRI. Electronically Signed   By: Kathreen Devoid   On: 01/16/2017 10:40    ROS: As per HPI otherwise negative.   Physical Exam: Vitals:   01/16/17 0930 01/16/17 1118 01/16/17 1130 01/16/17 1215  BP: (!) 190/87 (!) 176/74 (!) 181/77 (!) 184/93  Pulse:      Resp: 16 (!) 23 18 14   Temp:      TempSrc:      SpO2:         General: Chronically ill appearing female in mod distress D/T pain.  Head: Normocephalic, atraumatic, sclera non-icteric, mucus membranes are moist Neck: Supple. JVD not elevated. Lungs: Clear bilaterally to auscultation without wheezes, rales, or rhonchi. Breathing is unlabored. Heart: RRR with S1 S2. No murmurs, rubs, or gallops appreciated. Abdomen: Soft, non-tender, non-distended with normoactive bowel sounds. No rebound/guarding. No obvious abdominal masses. M-S:  Strength and tone appear normal for age. Lower extremities: No LE edema. Swelling noted L lateral mid thigh. Area is warm to touch, exquisitely tender. Amputation L foot.   Neuro: Alert and oriented X 3. Moves all extremities spontaneously. Psych:  Responds to questions appropriately with a normal affect. Dialysis Access: L AVG +  bruit  Dialysis Orders: [place] T,TH,S 3.5 hrs 63.5 kg 400/800  2.0 K/2.25 Ca UF profile 2 -Heparin: NONE -Mircera 200 mcg IV q 2 weeks (last dose 150 mcg IV 01/07/17) -Venofer 50 mg IV weekly (last dose 01/11/2017 Last Fe 36 Ferritin 876 Tsat 18% 12/26/16-rec'd venofer 100 mg IV X 8 doses) -Hectorol 2 mcg IV TIW (Last PTH 593 12/26/16)   BMD meds:  Calcium Acetate 667 mg 2 capsules TID AC Renvela 800 mg 3 tabs PO TID AC   Assessment/Plan: 1.  Pain L thigh: Repeat MRI. Consider Myositis, DVT. Doubt Rhabdo. Repeat MR of femur ordered. Per primary 2.  Tarry/bloody stools/ABL anemia: HGB 6.6. Has been taking goody powders. PPI drip per ED MD. Patient is Jehovah Witness. Still refusing blood. Give Max ESA dose today. Had recent Fe load at HD center. GI consult.  3.  Mild Hyperkalemia: K+ 5.9. HD on schedule. 2.0 K bath.  4.  ESRD - T,Th, S. HD today on schedule.  5.  Hypertension/volume  - Hypertensive in ED. Amlodipine 10 mg Q hs, clonidine 0.1 mg PO BID and carvedilol 3.25 mg PO BID on OP med list. No evidence of volume overload per exam. Attempt 3-3.5 liter UFG today.  6.  Anemia  - HGB 6.6 as noted above 7.  Metabolic bone disease -  Last OP phos 2.9 12/26/16. Ca 8.8. Hold binders-recheck renal function panel. Cont VDRA.  8.  Nutrition - Albumin 2.6. Renal diet with fld restrictions. Renal vit/nepro. 9.  DM: per primary  Rita H. Owens Shark, NP-C 01/16/2017, 12:26 PM  [facility] Beeper [phone]  Pt seen, examined and agree w A/P as above.  Kelly Splinter MD [employer] pager [phone]   01/16/2017, 3:09 PM

## 2017-01-16 NOTE — ED Notes (Signed)
Patient transported to MRI 

## 2017-01-16 NOTE — ED Provider Notes (Addendum)
[facility] DEPT Provider Note   CSN: [id number] Arrival date & time: 01/16/17  0908     History   Chief Complaint Chief Complaint  Patient presents with  . Wound Infection    HPI [name] is a 51 y.o. female. Chief complaint is left leg pain  HPI: 51 year old female with extensive past medical history presents with continued left leg pain.  History of HTN, IDDM-2, Bipolar 1, ESRD on TTS HD, CHF c Echo 40%, MRSA sepsis from head wound infection, Sz, TIA, CVA, Neuropathy, Hep-C, drug abuse. Pt is a Jehovah's Witness.  Patient was admitted 5-29 through 5-31 with left leg infection. Blood cultures were negative. Was treated empirically initially with vancomycin and Maxipime. MRI did not show necrotizing fasciitis. Was discharged after 3 days on by mouth clindamycin. This was changed by one of her outpatient physicians to doxycycline. Her legs to hurt. She is taking doxycycline, as well as Goody powder several times per day. She's noticed facial bloody stools over the last several days. She has worsening left leg pain.  Patient is a Sales promotion account executive Witness and declines blood product transfusion.  Patient states that her pain has been significant to the point that she was considering signing hospice papers actually today at her dialysis. However she states that she "wasn't quite ready to just do that". She presents here "hoping that I can get treatment and help for my leg".  Past Medical History:  Diagnosis Date  . Anemia   . Anxiety   . Bipolar 1 disorder ([place])   . Bipolar disorder, unspecified ([place]) 02/06/2014  . Chronic combined systolic and diastolic CHF (congestive heart failure) (HCC)    EF 40% by echo and 48% by stress test  . Chronic pain syndrome   . Depression   . ESRD on hemodialysis ([place])   . GERD (gastroesophageal reflux disease)   . Glaucoma   . Headache    sinus headaches  . Hepatitis C   . High cholesterol   . History of hiatal hernia   . History of vitamin D  deficiency 06/20/2015  . Hypertension   . MRSA infection ?2014   "on the back of my head; spread throughout my bloodstream"  . Neuropathy   . Refusal of blood transfusions as patient is Jehovah's Witness   . Schizoaffective disorder, unspecified condition 02/08/2014  . Seizure [facility]) dx'd 2014   "don't know what kind; last one was ~ 04/2014"  . Stroke [facility])    TIA 2009  . TIA (transient ischemic attack) 2010  . Type II diabetes mellitus ([place])    INSULIN DEPENDENT    Patient Active Problem List   Diagnosis Date Noted  . Cellulitis of left thigh 01/01/2017  . ESRD (end stage renal disease) ([place]) 01/01/2017  . Cocaine use disorder, severe, dependence ([place]) 08/29/2016  . Hepatitis C 08/29/2016  . Diabetic ulcer of calf ([place]) 08/29/2016  . Diabetic ulcer of toe ([place]) 08/29/2016  . Cerebrovascular accident (CVA) ([place]) 08/29/2016  . MDD (major depressive disorder) 08/28/2016  . Dyslipidemia associated with type 2 diabetes mellitus ([place]) 11/10/2015  . History of vitamin D deficiency 06/20/2015  . Long term prescription opiate use 06/20/2015  . Chronic pain syndrome 06/19/2015  . Anemia of chronic kidney failure 05/01/2015  . transmetatarsal amputation of the left foot 12/13/2014  . Unilateral visual loss 11/22/2014  . 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 ([place]) 11/22/2014  . Acute on chronic combined systolic and diastolic CHF (congestive heart failure) ([place])   .  Anemia, iron deficiency 04/13/2014  . Noncompliance 02/02/2014  . CKD (chronic kidney disease) stage 5, GFR less than 15 ml/min (HCC) 05/18/2013  . Seizure disorder ([place]) 02/09/2013    Past Surgical History:  Procedure Laterality Date  . ABDOMINAL HYSTERECTOMY  2014  . ABDOMINAL HYSTERECTOMY  2010  . AMPUTATION Left 05/21/2013   Procedure: Left Midfoot AMPUTATION;  Surgeon: Newt Minion, MD;  Location: [facility];  Service: Orthopedics;  Laterality: Left;  Left Midfoot Amputation  . AV FISTULA PLACEMENT Left 05/04/2015     Procedure: ARTERIOVENOUS (AV) GRAFT INSERTION;  Surgeon: Angelia Mould, MD;  Location: [place];  Service: Vascular;  Laterality: Left;  . COLONOSCOPY WITH PROPOFOL N/A 06/22/2013   Procedure: COLONOSCOPY WITH PROPOFOL;  Surgeon: Jeryl Columbia, MD;  Location: [facility] ENDOSCOPY;  Service: Endoscopy;  Laterality: N/A;  . ESOPHAGOGASTRODUODENOSCOPY N/A 05/11/2015   Procedure: ESOPHAGOGASTRODUODENOSCOPY (EGD);  Surgeon: Laurence Spates, MD;  Location: [facility] ENDOSCOPY;  Service: Endoscopy;  Laterality: N/A;  . EYE SURGERY Bilateral 2010   Lasik  . FOOT AMPUTATION Left    due diabetic neuropathy, could not feel ulcer on bottom of foot  . REFRACTIVE SURGERY Bilateral 2013  . TONSILLECTOMY  1970's  . TUBAL LIGATION    . TUBAL LIGATION  2000    OB History    Gravida Para Term Preterm AB Living   0 0 0 0 0     SAB TAB Ectopic Multiple Live Births   0 0 0           Home Medications    Prior to Admission medications   Medication Sig Start Date End Date Taking? Authorizing Provider  albuterol (PROVENTIL HFA;VENTOLIN HFA) 108 (90 Base) MCG/ACT inhaler Inhale 2 puffs into the lungs every 4 (four) hours as needed for wheezing or shortness of breath. 10/15/16  Yes Horton, Barbette Hair, MD  amLODipine (NORVASC) 10 MG tablet Take 1 tablet (10 mg total) by mouth at bedtime. 09/02/16  Yes Hildred Priest, MD  atorvastatin (LIPITOR) 40 MG tablet Take 1 tablet (40 mg total) by mouth at bedtime. 09/02/16  Yes Hildred Priest, MD  calcium acetate (PHOSLO) 667 MG capsule Take 2 capsules (1,334 mg total) by mouth 3 (three) times daily with meals. 09/02/16  Yes Hildred Priest, MD  carvedilol (COREG) 25 MG tablet Take 1 tablet (25 mg total) by mouth 2 (two) times daily with a meal. 09/02/16  Yes Hildred Priest, MD  cloNIDine (CATAPRES) 0.1 MG tablet Take 1 tablet (0.1 mg total) by mouth every 8 (eight) hours. 09/02/16  Yes Hildred Priest, MD  furosemide (LASIX) 80  MG tablet Take 1 tablet (80 mg total) by mouth 2 (two) times daily. Patient taking differently: Take 80 mg by mouth 2 (two) times daily as needed for fluid or edema.  09/02/16  Yes Hildred Priest, MD  haloperidol (HALDOL) 0.5 MG tablet Take 1 tablet (0.5 mg total) by mouth every 8 (eight) hours as needed for agitation. Patient taking differently: Take 0.5 mg by mouth at bedtime.  09/03/16  Yes Hildred Priest, MD  insulin aspart (NOVOLOG) 100 UNIT/ML injection Inject 5 Units into the skin 3 (three) times daily with meals. 09/03/16  Yes Hildred Priest, MD  insulin glargine (LANTUS) 100 UNIT/ML injection Inject 0.22 mLs (22 Units total) into the skin at bedtime. 09/03/16  Yes Hildred Priest, MD  multivitamin (RENA-VIT) TABS tablet Take 1 tablet by mouth at bedtime. 09/02/16  Yes Hildred Priest, MD  sertraline (ZOLOFT) 50 MG tablet Take 1  tablet (50 mg total) by mouth daily. 09/02/16  Yes Hildred Priest, MD  traZODone (DESYREL) 50 MG tablet Take 0.5 tablets (25 mg total) by mouth at bedtime. 09/02/16  Yes Hildred Priest, MD  doxercalciferol (HECTOROL) 4 MCG/2ML injection Inject 0.5 mLs (1 mcg total) into the vein Every Tuesday,Thursday,and Saturday with dialysis. 09/03/16   Hildred Priest, MD  lidocaine-prilocaine (EMLA) cream Apply 1 application topically See admin instructions. Apply to dialysis site one hour prior to dialysis    [provider]  LORazepam (ATIVAN) 2 MG/ML injection Inject 1 mL (2 mg total) into the vein Every Tuesday,Thursday,and Saturday with dialysis. 09/03/16   Hildred Priest, MD    Family History Family History  Problem Relation Age of Onset  . Hypertension Mother   . Hypertension Father     Social History Social History  Substance Use Topics  . Smoking status: Former Smoker    Packs/day: 1.00    Years: 20.00    Types: Cigarettes    Quit date: 03/31/2015  .  Smokeless tobacco: Never Used     Comment: "stopped smoking in ~ 2013; restarted last week; stopped again 06/17/2014"  . Alcohol use 1.8 oz/week    3 Standard drinks or equivalent per week     Comment: 05/01/15 patient denies alcohol use.     Allergies   Penicillins; Gabapentin; Lyrica [pregabalin]; Saphris [asenapine]; Tramadol; and Other   Review of Systems Review of Systems  Constitutional: Positive for chills and fatigue. Negative for appetite change, diaphoresis and fever.  HENT: Negative for mouth sores, sore throat and trouble swallowing.   Eyes: Negative for visual disturbance.  Respiratory: Negative for cough, chest tightness, shortness of breath and wheezing.   Cardiovascular: Negative for chest pain.  Gastrointestinal: Positive for abdominal pain and blood in stool. Negative for abdominal distention, diarrhea, nausea and vomiting.  Endocrine: Negative for polydipsia, polyphagia and polyuria.  Genitourinary: Negative for dysuria, frequency and hematuria.  Musculoskeletal: Positive for gait problem and myalgias.  Skin: Negative for color change, pallor and rash.  Neurological: Negative for dizziness, syncope, light-headedness and headaches.  Hematological: Does not bruise/bleed easily.  Psychiatric/Behavioral: Negative for behavioral problems and confusion.     Physical Exam Updated Vital Signs BP (!) 184/93   Pulse 84   Temp 98.6 F (37 C) (Oral)   Resp 14   LMP  (LMP Unknown)   SpO2 100%   Physical Exam  Constitutional: She is oriented to person, place, and time. She appears well-developed and well-nourished. No distress.  HENT:  Head: Normocephalic.  Eyes: Conjunctivae are normal. Pupils are equal, round, and reactive to light. No scleral icterus.  Neck: Normal range of motion. Neck supple. No thyromegaly present.  Cardiovascular: Normal rate and regular rhythm.  Exam reveals no gallop and no friction rub.   No murmur heard. Pulmonary/Chest: Effort normal  and breath sounds normal. No respiratory distress. She has no wheezes. She has no rales.  Abdominal: She exhibits no distension. There is tenderness. There is no guarding.  Black stool. Guaiac positive.  Musculoskeletal: Normal range of motion.  Tenderness with soft tissue induration left anterior thigh. No crepitus. It is not erythematous or hot to the touch. Well-perfused distally.  Neurological: She is alert and oriented to person, place, and time.  Skin: Skin is warm and dry. No rash noted.  Psychiatric: She has a normal mood and affect. Her behavior is normal.     ED Treatments / Results  Labs (all labs ordered are listed, but  only abnormal results are displayed) Labs Reviewed  CBC WITH DIFFERENTIAL/PLATELET - Abnormal; Notable for the following:       Result Value   WBC 12.1 (*)    RBC 2.38 (*)    Hemoglobin 6.6 (*)    HCT 20.6 (*)    RDW 18.6 (*)    Neutro Abs 10.4 (*)    All other components within normal limits  COMPREHENSIVE METABOLIC PANEL - Abnormal; Notable for the following:    Sodium 127 (*)    Potassium 5.9 (*)    Chloride 88 (*)    Glucose, Bld 340 (*)    BUN 36 (*)    Creatinine, Ser 5.55 (*)    Calcium 8.8 (*)    Albumin 2.6 (*)    GFR calc non Af Amer 8 (*)    GFR calc Af Amer 9 (*)    All other components within normal limits  SEDIMENTATION RATE - Abnormal; Notable for the following:    Sed Rate 100 (*)    All other components within normal limits  C-REACTIVE PROTEIN - Abnormal; Notable for the following:    CRP 3.5 (*)    All other components within normal limits  PROTIME-INR - Abnormal; Notable for the following:    Prothrombin Time 16.2 (*)    All other components within normal limits  RETICULOCYTES - Abnormal; Notable for the following:    Retic Ct Pct 4.1 (*)    RBC. 2.41 (*)    All other components within normal limits  CBG MONITORING, ED - Abnormal; Notable for the following:    Glucose-Capillary 383 (*)    All other components within  normal limits  CULTURE, BLOOD (ROUTINE X 2)  CULTURE, BLOOD (ROUTINE X 2)  CK  VITAMIN B12  IRON AND TIBC  FERRITIN  FOLATE  I-STAT CG4 LACTIC ACID, ED    EKG  EKG Interpretation None       Radiology Dg Femur Min 2 Views Left  Result Date: 01/16/2017 CLINICAL DATA:  Pain, soft tissue mass, ongoing for 2 weeks EXAM: LEFT FEMUR 2 VIEWS COMPARISON:  None. FINDINGS: No fracture or dislocation. No aggressive lytic or sclerotic osseous lesion. Peripheral vascular atherosclerotic disease. No focal soft tissue abnormality. IMPRESSION: No acute osseous injury of the left femur. If there is persisting concern regarding soft tissue abnormality recommend evaluation with MRI. Electronically Signed   By: Kathreen Devoid   On: 01/16/2017 10:40    Procedures Procedures (including critical care time)  Medications Ordered in ED Medications  morphine 4 MG/ML injection 4 mg (4 mg Intravenous Given 01/16/17 1050)  pantoprazole (PROTONIX) 80 mg in sodium chloride 0.9 % 250 mL (0.32 mg/mL) infusion (8 mg/hr Intravenous New Bag/Given 01/16/17 1140)  pantoprazole (PROTONIX) injection 40 mg (not administered)  ceFEPIme (MAXIPIME) 2 g in dextrose 5 % 50 mL IVPB (0 g Intravenous Stopped 01/16/17 1120)  ondansetron (ZOFRAN) injection 4 mg (4 mg Intravenous Given 01/16/17 1050)  vancomycin (VANCOCIN) IVPB 1000 mg/200 mL premix (0 mg Intravenous Stopped 01/16/17 1221)  pantoprazole (PROTONIX) 80 mg in sodium chloride 0.9 % 100 mL IVPB (0 mg Intravenous Stopped 01/16/17 1140)  insulin aspart (novoLOG) injection 10 Units (10 Units Subcutaneous Given 01/16/17 1223)     Initial Impression / Assessment and Plan / ED Course  I have reviewed the triage vital signs and the nursing notes.  Pertinent labs & imaging results that were available during my care of the patient were reviewed by me and considered in  my medical decision making (see chart for details).   concern of GI bleed in Jehovah's Witness with end-stage  renal disease. Concern of leg pain and diabetic renal patient. Concern for necrotizing fasciitis. Plan soft tissue imaging lab evaluation risk calculation. Will likely need MRI to rule in or out necrotizing fasciitis. Most recent MRI 5/29 was negative. Cultures. Antibiotics. PPI bolus and infusion.  Final Clinical Impressions(s) / ED Diagnoses   Final diagnoses:  Pain  Pain of left lower extremity  ESRD (end stage renal disease) (HCC)  UGIB (upper gastrointestinal bleed)  Anemia in chronic kidney disease, on chronic dialysis [facility])   Patient is anemic with hemoglobin of 6.6. Potassium 5.9. Clinically not in congestive heart failure. Has LVH on EKG with peaked T waves in lead V4 only. No others EKG findings of hyperkalemia. Also hyperglycemic. given SQ insulin. Rhythm stable. No indications for IV Ca++ at this time, unless dialysis delayed. Will follow. 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 Considered IV Iron, but Hb normocytic and Fe pending. Discussed with [facility] hospitalist. Patient will be admitted. Consult to palliative care, and gastrology pending.    CRITICAL CARE Performed by: Tanna Furry JOSEPH   Total critical care time: 30 minutes  Critical care time was exclusive of separately billable procedures and treating other patients.  Critical care was necessary to treat or prevent imminent or life-threatening deterioration.  Critical care was time spent personally by me on the following activities: development of treatment plan with patient and/or surrogate as well as nursing, discussions with consultants, evaluation of patient's response to treatment, examination of patient, obtaining history from patient or surrogate, ordering and performing treatments and interventions, ordering and review of laboratory studies, ordering and review of radiographic studies, pulse oximetry and re-evaluation of patient's condition.   New  Prescriptions New Prescriptions   No medications on file    11:45: Spoke with Juanell Fairly of [facility]. They will plan dialysis of patient today. 12:44: Spoke with [facility]. They will see patient in consultation.  Tanna Furry, MD 01/16/17 1235    Tanna Furry, MD 01/16/17 1245    Tanna Furry, MD 01/16/17 1247

## 2017-01-16 NOTE — Consult Note (Addendum)
Referring Provider: [facility]   Primary Care Physician:  Patient, [name] Primary Gastroenterologist:   Unassigned Reason for Consultation: rectal bleeding and anemia  ASSESSMENT AND PLAN:    92. 51 yo female with chronic, intermittent rectal bleeding and acute on chronic normocytic anemia. Baseline hgb difficult to determine but ranges 8-10, down to 5.8 today. No blood in stool on exam. She has been taking Ibuprofen ( chronic) plus took [place] powders a few days ago.  - She may need repeat EGD and if negative, a colonoscopy especially since she will not take blood.  - IV PPI. BID would be okay since not actively bleeding though gtt already started.  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 ESRD, on HD. Dialyzing now.   3. LLE cellulitis, on antibiotics.    4. Chronic pain. Taking NSAIDS now because no longer getting pain meds.      [facility] Attending   I have taken an interval history, reviewed the chart and examined the patient. I agree with the Advanced Practitioner's note, impression and recommendations.   Sounds like lower GI bleeding with acute blood loss anemia + chronic anemia and I am suspicious of hemorrhoids or possibly ischemia in her case. Flex sig, possible hemorrhoid banding tomorrow.  She does have dyspeptic sxs but does not sound like UGI bleed.  The risks and benefits as well as alternatives of endoscopic procedure(s) have been discussed and reviewed. All questions answered. The patient agrees to proceed.  Gatha Mayer, MD, [facility] [phone] (pager) [phone] after 5 PM, weekends and holidays  01/16/2017 6:48 PM    HPI: [name] is a 51 y.o. female with DM, HTN, CHF, ESRD on HD. She has Bipolar disorder and schizoaffective disorder, HCV and cocaine use. Patient has been seen several times in the hospital for evaluation of rectal bleeding.  This time the rectal bleeding started two weeks ago. She  doesn't have a BM everyday but each time she has there has been blood on stool. Describes clots of blood but denies black stool. . She has chronic abdominal pain and that is unchanged. She has been taking ibuprofen at home and a few days ago added in some goody powders. Since taking the Goody powders her chest has been burning, especially when she eats. Throat feels raw. Some nausea which she attributes to antibiotics. Admitted today for ongoing LLE. She was hospitalized two weeks ago with left thigh cellulitis and has been on antibiotics.    Endoscopic workup :  EGD 2015 for upper abdominal pain was unremarkable.   Colonoscopy 2014 for rectal bleeding was unremarkable except for hemorrhoids though prep was poor.   Past Medical History:  Diagnosis Date  . Anemia   . Anxiety   . Bipolar disorder, unspecified ([place]) 02/06/2014  . Chronic combined systolic and diastolic CHF (congestive heart failure) (HCC)    EF 40% by echo and 48% by stress test  . Chronic pain syndrome   . Depression   . ESRD on hemodialysis ([place])   . GERD (gastroesophageal reflux disease)   . Glaucoma   . Headache    sinus headaches  . Hepatitis C   . High cholesterol   . History of hiatal hernia   . History of vitamin D deficiency 06/20/2015  . Hypertension   . MRSA infection ?2014   "on the back of my head; spread throughout my bloodstream"  . Neuropathy   . Refusal of  blood transfusions as patient is Jehovah's Witness   . Schizoaffective disorder, unspecified condition 02/08/2014  . Seizure [facility]) dx'd 2014   "don't know what kind; last one was ~ 04/2014"  . Stroke [facility])    TIA 2009  . TIA (transient ischemic attack) 2010  . Type II diabetes mellitus ([place])    INSULIN DEPENDENT    Past Surgical History:  Procedure Laterality Date  . ABDOMINAL HYSTERECTOMY  2014  . ABDOMINAL HYSTERECTOMY  2010  . AMPUTATION Left 05/21/2013   Procedure: Left Midfoot AMPUTATION;  Surgeon: Newt Minion, MD;  Location: [place];   Service: Orthopedics;  Laterality: Left;  Left Midfoot Amputation  . AV FISTULA PLACEMENT Left 05/04/2015   Procedure: ARTERIOVENOUS (AV) GRAFT INSERTION;  Surgeon: Angelia Mould, MD;  Location: [place];  Service: Vascular;  Laterality: Left;  . COLONOSCOPY WITH PROPOFOL N/A 06/22/2013   Procedure: COLONOSCOPY WITH PROPOFOL;  Surgeon: Jeryl Columbia, MD;  Location: [facility];  Service: Endoscopy;  Laterality: N/A;  . ESOPHAGOGASTRODUODENOSCOPY N/A 05/11/2015   Procedure: ESOPHAGOGASTRODUODENOSCOPY (EGD);  Surgeon: Laurence Spates, MD;  Location: [facility] ENDOSCOPY;  Service: Endoscopy;  Laterality: N/A;  . EYE SURGERY Bilateral 2010   Lasik  . FOOT AMPUTATION Left    due diabetic neuropathy, could not feel ulcer on bottom of foot  . REFRACTIVE SURGERY Bilateral 2013  . TONSILLECTOMY  1970's  . TUBAL LIGATION  2000    Prior to Admission medications   Medication Sig Start Date End Date Taking? Authorizing Provider  albuterol (PROVENTIL HFA;VENTOLIN HFA) 108 (90 Base) MCG/ACT inhaler Inhale 2 puffs into the lungs every 4 (four) hours as needed for wheezing or shortness of breath. 10/15/16  Yes Horton, Barbette Hair, MD  amLODipine (NORVASC) 10 MG tablet Take 1 tablet (10 mg total) by mouth at bedtime. 09/02/16  Yes Hildred Priest, MD  atorvastatin (LIPITOR) 40 MG tablet Take 1 tablet (40 mg total) by mouth at bedtime. 09/02/16  Yes Hildred Priest, MD  calcium acetate (PHOSLO) 667 MG capsule Take 2 capsules (1,334 mg total) by mouth 3 (three) times daily with meals. 09/02/16  Yes Hildred Priest, MD  carvedilol (COREG) 25 MG tablet Take 1 tablet (25 mg total) by mouth 2 (two) times daily with a meal. 09/02/16  Yes Hildred Priest, MD  cloNIDine (CATAPRES) 0.1 MG tablet Take 1 tablet (0.1 mg total) by mouth every 8 (eight) hours. 09/02/16  Yes Hildred Priest, MD  furosemide (LASIX) 80 MG tablet Take 1 tablet (80 mg total) by mouth 2 (two) times  daily. Patient taking differently: Take 80 mg by mouth 2 (two) times daily as needed for fluid or edema.  09/02/16  Yes Hildred Priest, MD  haloperidol (HALDOL) 0.5 MG tablet Take 1 tablet (0.5 mg total) by mouth every 8 (eight) hours as needed for agitation. Patient taking differently: Take 0.5 mg by mouth at bedtime.  09/03/16  Yes Hildred Priest, MD  insulin aspart (NOVOLOG) 100 UNIT/ML injection Inject 5 Units into the skin 3 (three) times daily with meals. 09/03/16  Yes Hildred Priest, MD  insulin glargine (LANTUS) 100 UNIT/ML injection Inject 0.22 mLs (22 Units total) into the skin at bedtime. 09/03/16  Yes Hildred Priest, MD  multivitamin (RENA-VIT) TABS tablet Take 1 tablet by mouth at bedtime. 09/02/16  Yes Hildred Priest, MD  sertraline (ZOLOFT) 50 MG tablet Take 1 tablet (50 mg total) by mouth daily. 09/02/16  Yes Hildred Priest, MD  traZODone (DESYREL) 50 MG tablet Take 0.5 tablets (25 mg  total) by mouth at bedtime. 09/02/16  Yes Hildred Priest, MD  doxercalciferol (HECTOROL) 4 MCG/2ML injection Inject 0.5 mLs (1 mcg total) into the vein Every Tuesday,Thursday,and Saturday with dialysis. 09/03/16   Hildred Priest, MD  lidocaine-prilocaine (EMLA) cream Apply 1 application topically See admin instructions. Apply to dialysis site one hour prior to dialysis    [provider]  LORazepam (ATIVAN) 2 MG/ML injection Inject 1 mL (2 mg total) into the vein Every Tuesday,Thursday,and Saturday with dialysis. 09/03/16   Hildred Priest, MD    Current Facility-Administered Medications  Medication Dose Route Frequency Provider Last Rate Last Dose  . Darbepoetin Alfa (ARANESP) 200 MCG/0.4ML injection           . 0.9 %  sodium chloride infusion  100 mL Intravenous PRN Valentina Gu, NP      . 0.9 %  sodium chloride infusion  100 mL Intravenous PRN Valentina Gu, NP      . 0.9 %   sodium chloride infusion  250 mL Intravenous PRN Sofia, Leslie K, PA-C      . acetaminophen (TYLENOL) tablet 650 mg  650 mg Oral Q6H PRN Caryl Ada K, PA-C       Or  . acetaminophen (TYLENOL) suppository 650 mg  650 mg Rectal Q6H PRN Caryl Ada K, PA-C      . albuterol (PROVENTIL) (2.5 MG/3ML) 0.083% nebulizer solution 3 mL  3 mL Inhalation Q4H PRN Caryl Ada K, PA-C      . alteplase (CATHFLO ACTIVASE) injection 2 mg  2 mg Intracatheter Once PRN Valentina Gu, NP      . amLODipine (NORVASC) tablet 10 mg  10 mg Oral QHS Sofia, Leslie K, PA-C      . atorvastatin (LIPITOR) tablet 40 mg  40 mg Oral QHS Sofia, Leslie K, [place]      . calcium acetate (PHOSLO) capsule 1,334 mg  1,334 mg Oral TID WC Caryl Ada K, PA-C      . carvedilol (COREG) tablet 25 mg  25 mg Oral BID WC Sofia, Leslie K, PA-C      . [START ON 01/18/2017] ceFEPIme (MAXIPIME) 2 g in dextrose 5 % 50 mL IVPB  2 g Intravenous Q T,Th,Sat-1800 Rumbarger, Rachel L, RPH      . cloNIDine (CATAPRES) tablet 0.1 mg  0.1 mg Oral [address] [place], [place]      . Darbepoetin Alfa (ARANESP) injection 200 mcg  200 mcg Intravenous Q Thu-HD Valentina Gu, NP   200 mcg at 01/16/17 1557  . [START ON 01/18/2017] doxercalciferol (HECTOROL) injection 1 mcg  1 mcg Intravenous Q T,Th,Sa-HD Sofia, Leslie K, PA-C      . haloperidol (HALDOL) tablet 0.5 mg  0.5 mg Oral QHS Sofia, Leslie K, [place]      . heparin injection 1,000 Units  1,000 Units Dialysis PRN Valentina Gu, NP      . insulin aspart (novoLOG) injection 0-15 Units  0-15 Units Subcutaneous TID WC Sofia, Leslie K, PA-C      . insulin aspart (novoLOG) injection 0-5 Units  0-5 Units Subcutaneous QHS Sofia, Leslie K, PA-C      . lidocaine (PF) (XYLOCAINE) 1 % injection 5 mL  5 mL Intradermal PRN Valentina Gu, NP      . lidocaine-prilocaine (EMLA) cream 1 application  1 application Topical PRN Valentina Gu, NP      . Derrill Memo ON 01/18/2017] LORazepam (ATIVAN)  injection 2 mg  2 mg Intravenous  Q T,Th,Sa-HD Caryl Ada K, PA-C      . morphine 4 MG/ML injection 4 mg  4 mg Intravenous Q1H PRN Tanna Furry, MD   4 mg at 01/16/17 1050  . morphine 4 MG/ML injection 4 mg  4 mg Intravenous Q4H PRN Fransico Meadow, PA-C   4 mg at 01/16/17 1458  . multivitamin (RENA-VIT) tablet 1 tablet  1 tablet Oral QHS Sofia, Leslie K, PA-C      . pantoprazole (PROTONIX) 80 mg in sodium chloride 0.9 % 250 mL (0.32 mg/mL) infusion  8 mg/hr Intravenous Continuous Tanna Furry, MD 25 mL/hr at 01/16/17 1140 8 mg/hr at 01/16/17 1140  . [START ON 01/19/2017] pantoprazole (PROTONIX) injection 40 mg  40 mg Intravenous Q12H Tanna Furry, MD      . pentafluoroprop-tetrafluoroeth [facility]) aerosol 1 application  1 application Topical PRN Valentina Gu, NP      . sertraline (ZOLOFT) tablet 50 mg  50 mg Oral Daily Sofia, Leslie K, [place]      . sodium chloride flush (NS) 0.9 % injection 3 mL  3 mL Intravenous Q12H Sofia, Leslie K, PA-C      . sodium chloride flush (NS) 0.9 % injection 3 mL  3 mL Intravenous Q12H Sofia, Leslie K, PA-C      . sodium chloride flush (NS) 0.9 % injection 3 mL  3 mL Intravenous PRN Sofia, Leslie K, PA-C      . traZODone (DESYREL) tablet 25 mg  25 mg Oral QHS Sofia, Leslie K, PA-C      . vancomycin (VANCOCIN) IVPB 750 mg/150 ml premix  750 mg Intravenous Q T,Th,Sa-HD Rozann Lesches, [facility]        Allergies as of 01/16/2017 - Review Complete 01/16/2017  Allergen Reaction Noted  . Penicillins Hives and Other (See Comments) 09/30/2014  . Gabapentin Itching and Swelling 02/09/2013  . Lyrica [pregabalin] Swelling and Other (See Comments) 09/30/2014  . Saphris [asenapine] Swelling and Other (See Comments) 09/30/2014  . Tramadol Swelling and Other (See Comments) 09/30/2014  . Other Other (See Comments) 05/01/2015    Family History  Problem Relation Age of Onset  . Hypertension Mother   . Hypertension Father     Social History   Social History  . Marital  status: Single    Spouse name: N/A  . Number of children: N/A  . Years of education: N/A   Occupational History  . Not on file.   Social History Main Topics  . Smoking status: Former Smoker    Packs/day: 1.00    Years: 20.00    Types: Cigarettes    Quit date: 03/31/2015  . Smokeless tobacco: Never Used     Comment: "stopped smoking in ~ 2013; restarted last week; stopped again 06/17/2014"  . Alcohol use 1.8 oz/week    3 Standard drinks or equivalent per week     Comment: 05/01/15 patient denies alcohol use.  . Drug use: Yes    Types: "Crack" cocaine, Cocaine     Comment: last smoked crack 08/21/16  . Sexual activity: Not Currently    Birth control/ protection: Abstinence   Other Topics Concern  . Not on file   Social History Narrative   ** Merged History Encounter **        Review of Systems: All systems reviewed and negative except where noted in HPI.  Physical Exam: Vital signs in last 24 hours: Temp:  [98.1 F (36.7 C)-98.6 F (37 C)] 98.1 F (36.7 C) (06/14  1425) Pulse Rate:  [78-84] 79 (06/14 1530) Resp:  [9-23] 16 (06/14 1425) BP: (127-190)/(73-93) 127/73 (06/14 1530) SpO2:  [96 %-100 %] 96 % (06/14 1425) Weight:  [146 lb 9.7 oz (66.5 kg)] 146 lb 9.7 oz (66.5 kg) (06/14 1425)   General:   Alert, well developed black female in NAD Psych:  Pleasant, cooperative. Normal mood and affect. Eyes:  Pupils equal, sclera clear, no icterus.   Conjunctiva pink. Mouth: no candida seen Ears:  Normal auditory acuity. Nose:  No deformity, discharge,  or lesions. Neck:  Supple; no masses Lungs:  Normal effort, bibasilar crackles. No wheezes Heart:  Regular rate and rhythm, no edema Abdomen:  Soft, non-distended, nontender, BS active, no palp mass    Ext: right elbow malformed.  partial amputation of left food Rectal:  Large hemorrhoidal tags. No stool, blood in vault.   Msk:  Symmetrical without gross deformities. . Neurologic:  Alert and  oriented x4;  grossly normal  neurologically.   Intake/Output from previous day: No intake/output data recorded. Intake/Output this shift: Total I/O In: 150 [IV Piggyback:150] Out: -   Lab Results:  Recent Labs  01/16/17 1007 01/16/17 1452  WBC 12.1* 8.9  HGB 6.6* 5.8*  HCT 20.6* 18.3*  PLT 304 263   BMET  Recent Labs  01/16/17 1007 01/16/17 1452  NA 127* 128*  K 5.9* 4.6  CL 88* 92*  CO2 26 26  GLUCOSE 340* 193*  BUN 36* 30*  CREATININE 5.55* 4.40*  CALCIUM 8.8* 8.2*   LFT  Recent Labs  01/16/17 1300 01/16/17 1452  PROT 8.2*  --   ALBUMIN 2.6* 2.3*  AST 26  --   ALT 18  --   ALKPHOS 107  --   BILITOT 0.9  --   BILIDIR 0.2  --   IBILI 0.7  --    PT/INR  Recent Labs  01/16/17 1007  LABPROT 16.2*  INR 1.29     Studies/Results: Mr Femur Left Wo Contrast  Result Date: 01/16/2017 CLINICAL DATA:  Left leg mass over the past 2 weeks. Partial amputation of the left foot. EXAM: MR OF THE LEFT FEMUR WITHOUT CONTRAST TECHNIQUE: Multiplanar, multisequence MR imaging of the left upper leg/femur was performed. No intravenous contrast was administered. COMPARISON:  01/01/2017 FINDINGS: Despite efforts by the technologist and patient, motion artifact is present on today's exam and could not be eliminated. This reduces exam sensitivity and specificity. Bones/Joint/Cartilage No significant abnormal osseous edema. Moderate left and trace right knee joint effusion. Ligaments N/A Muscles and Tendons Extensive abnormal edema in the anterior compartmental musculature of the upper leg, with new lower progressive heterogeneous but high T2 signal intensity fluid collection primarily tracking within along the vastus lateralis and vastus intermedius muscles, measuring 7.2 by 5.6 by 18.9 cm (volume = 400 cm^3). This could represent abscess or hematoma. There is some disruption of portions of the vastus lateralis, and edema signal is again identified in all anterior compartmental musculature. There is also some  edema tracking within and around the pectineus and adductor longus muscles. Soft tissues Subcutaneous edema in the upper thighs, left greater than right. IMPRESSION: 1. Abnormal extensive edema in the anterior compartment of the left upper leg, with some mild involvement of the medial compartment. Compartment syndrome and infection not excluded. Compared to the prior exam there has been progressive enlargement of a fluid collection primarily involving the vastus lateralis and vastus intermedius, current volume 400 cubic cm, potentially abscess or hematoma. 2. Moderate left and  trace right knee effusions. Electronically Signed   By: Van Clines M.D.   On: 01/16/2017 14:48   Dg Femur Min 2 Views Left  Result Date: 01/16/2017 CLINICAL DATA:  Pain, soft tissue mass, ongoing for 2 weeks EXAM: LEFT FEMUR 2 VIEWS COMPARISON:  None. FINDINGS: No fracture or dislocation. No aggressive lytic or sclerotic osseous lesion. Peripheral vascular atherosclerotic disease. No focal soft tissue abnormality. IMPRESSION: No acute osseous injury of the left femur. If there is persisting concern regarding soft tissue abnormality recommend evaluation with MRI. Electronically Signed   By: Kathreen Devoid   On: 01/16/2017 10:40    Tye Savoy, NP-C @  01/16/2017, 4:32 PM  Pager number [phone]

## 2017-01-16 NOTE — Procedures (Signed)
  I was present at this dialysis session, have reviewed the session itself and made  appropriate changes Kelly Splinter MD [place] pager [phone]   01/16/2017, 3:10 PM

## 2017-01-16 NOTE — Progress Notes (Addendum)
Pharmacy Antibiotic Note  [name] is a 51 y.o. female admitted on 01/16/2017 with cellulitis.  Pharmacy has been consulted for vancomycin dosing. Pt is afebrile and WBC is WNL. History of ESRD on HD.   Plan: Vancomycin 1gm IV x 1 then 750mg  post-HD Cefepime 2gm post-HD F/u HD plans, C&S, clinical status and pre-HD level when appropriate     Temp (24hrs), Avg:98.6 F (37 C), Min:98.6 F (37 C), Max:98.6 F (37 C)  No results for input(s): WBC, CREATININE, LATICACIDVEN, VANCOTROUGH, VANCOPEAK, VANCORANDOM, GENTTROUGH, GENTPEAK, GENTRANDOM, TOBRATROUGH, TOBRAPEAK, TOBRARND, AMIKACINPEAK, AMIKACINTROU, AMIKACIN in the last 168 hours.  Estimated Creatinine Clearance: 7.4 mL/min (A) (by C-G formula based on SCr of 8.51 mg/dL (H)).    Allergies  Allergen Reactions  . Penicillins Hives and Other (See Comments)    Has patient had a PCN reaction causing immediate rash, facial/tongue/throat swelling, SOB or lightheadedness with hypotension: Yes Has patient had a PCN reaction causing severe rash involving mucus membranes or skin necrosis: No Has patient had a PCN reaction that required hospitalization No Has patient had a PCN reaction occurring within the last 10 years: No If all of the above answers are "NO", then may proceed with Cephalosporin use. Pt tolerates cefepime and ceftriaxone  . Gabapentin Itching and Swelling  . Lyrica [Pregabalin] Swelling and Other (See Comments)    Facial and leg swelling  . Saphris [Asenapine] Swelling and Other (See Comments)    Facial swelling  . Tramadol Swelling and Other (See Comments)    Leg swelling  . Other Other (See Comments)    NO BLOOD PRODUCTS. 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  Alette Kataoka, Rande Lawman 01/16/2017 9:48 AM

## 2017-01-16 NOTE — ED Notes (Signed)
CBG 383 

## 2017-01-16 NOTE — ED Triage Notes (Addendum)
Pt states she has been on doxycline for left leg infection. Taking 3 BC powders several times a day to help with leg pain. Due for dialysis today. Now she states stomach hurts as well. She states she is ready to begin hospice services and stop dialysis. Was supposed to meet them today.

## 2017-01-16 NOTE — ED Notes (Signed)
Patient transported to X-ray 

## 2017-01-16 NOTE — ED Notes (Signed)
Report given to dialysis.   

## 2017-01-16 NOTE — ED Notes (Signed)
Phlebotomy at bedside getting blood. Xray waiting to get patient. RN reviewed with her possibility of needing blood products; per patient and her religion, she would refuse at this time. No type and screen collected

## 2017-01-16 NOTE — Progress Notes (Signed)
     MR results noted IR consulted for drainage of 400cubic cm abscess vs hematoma Order placed in epic  Linna Darner, MD [place] 01/16/2017, 4:38 PM

## 2017-01-16 NOTE — H&P (Signed)
History and Physical    [name]:[id number] DOB: 1966/06/14 DOA: 01/16/2017  Referring MD/NP/PA: Tanna Furry MD PCP: Patient, [name] [facility]:  Patient coming from: home  Chief Complaint: painful swollen left leg and black stool   HPI: [name] is a 51 y.o. female with medical history significant of ESRD. CHF, chronic pain, Type 2 diabetes mellitus on insulin, cellulitis of left leg, anemia, htn and cocaine use.  Pt complains of severe pain in her left leg.  Pt reports she has been having pain for over 2 weeks.  Pt was scheduled for dialysis today and decided to come here instead to get help for leg pain.  Pt reports she has been thinking about stopping dialysis. Pt reports she has considered hospice.  She reports she decided today she is not ready for that and wants treatment for her leg infection.  Pt reports she has also had black stool.  Pt admits to taking goody powders for pain.  Pt reports left upper  leg is swollen and tender.  She denies having any fever,  Pt has pain with movement.  Pt reports minimal relief with otc medications.      ED Course: Pt given pain medication.  Pt started on vancomycin, maxipen and Protonix.  ED MD has consulted Gi, Nephrology and palliative care .   Review of Systems: As per HPI otherwise 10 point review of systems negative.    Past Medical History:  Diagnosis Date  . Anemia   . Anxiety   . Bipolar disorder, unspecified ([place]) 02/06/2014  . Chronic combined systolic and diastolic CHF (congestive heart failure) (HCC)    EF 40% by echo and 48% by stress test  . Chronic pain syndrome   . Depression   . ESRD on hemodialysis ([place])   . GERD (gastroesophageal reflux disease)   . Glaucoma   . Headache    sinus headaches  . Hepatitis C   . High cholesterol   . History of hiatal hernia   . History of vitamin D deficiency 06/20/2015  . Hypertension   . MRSA infection ?2014   "on the back of my head; spread throughout  my bloodstream"  . Neuropathy   . Refusal of blood transfusions as patient is Jehovah's Witness   . Schizoaffective disorder, unspecified condition 02/08/2014  . Seizure [facility]) dx'd 2014   "don't know what kind; last one was ~ 04/2014"  . Stroke [facility])    TIA 2009  . TIA (transient ischemic attack) 2010  . Type II diabetes mellitus ([place])    INSULIN DEPENDENT    Past Surgical History:  Procedure Laterality Date  . ABDOMINAL HYSTERECTOMY  2014  . ABDOMINAL HYSTERECTOMY  2010  . AMPUTATION Left 05/21/2013   Procedure: Left Midfoot AMPUTATION;  Surgeon: Newt Minion, MD;  Location: [place];  Service: Orthopedics;  Laterality: Left;  Left Midfoot Amputation  . AV FISTULA PLACEMENT Left 05/04/2015   Procedure: ARTERIOVENOUS (AV) GRAFT INSERTION;  Surgeon: Angelia Mould, MD;  Location: [place];  Service: Vascular;  Laterality: Left;  . COLONOSCOPY WITH PROPOFOL N/A 06/22/2013   Procedure: COLONOSCOPY WITH PROPOFOL;  Surgeon: Jeryl Columbia, MD;  Location: [facility] ENDOSCOPY;  Service: Endoscopy;  Laterality: N/A;  . ESOPHAGOGASTRODUODENOSCOPY N/A 05/11/2015   Procedure: ESOPHAGOGASTRODUODENOSCOPY (EGD);  Surgeon: Laurence Spates, MD;  Location: [facility] ENDOSCOPY;  Service: Endoscopy;  Laterality: N/A;  . EYE SURGERY Bilateral 2010   Lasik  . FOOT AMPUTATION Left  due diabetic neuropathy, could not feel ulcer on bottom of foot  . REFRACTIVE SURGERY Bilateral 2013  . TONSILLECTOMY  1970's  . TUBAL LIGATION  2000     reports that she quit smoking about 21 months ago. Her smoking use included Cigarettes. She has a 20.00 pack-year smoking history. She has never used smokeless tobacco. She reports that she drinks about 1.8 oz of alcohol per week . She reports that she uses drugs, including "Crack" cocaine and Cocaine.  Allergies  Allergen Reactions  . Penicillins Hives and Other (See Comments)    Has patient had a PCN reaction causing immediate rash, facial/tongue/throat swelling, SOB or lightheadedness  with hypotension: Yes Has patient had a PCN reaction causing severe rash involving mucus membranes or skin necrosis: No Has patient had a PCN reaction that required hospitalization No Has patient had a PCN reaction occurring within the last 10 years: No If all of the above answers are "NO", then may proceed with Cephalosporin use. Pt tolerates cefepime and ceftriaxone  . Gabapentin Itching and Swelling  . Lyrica [Pregabalin] Swelling and Other (See Comments)    Facial and leg swelling  . Saphris [Asenapine] Swelling and Other (See Comments)    Facial swelling  . Tramadol Swelling and Other (See Comments)    Leg swelling  . Other Other (See Comments)    NO BLOOD PRODUCTS. 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 Hypertension Mother   . Hypertension Father      Prior to Admission medications   Medication Sig Start Date End Date Taking? Authorizing Provider  albuterol (PROVENTIL HFA;VENTOLIN HFA) 108 (90 Base) MCG/ACT inhaler Inhale 2 puffs into the lungs every 4 (four) hours as needed for wheezing or shortness of breath. 10/15/16   Horton, Barbette Hair, MD  amLODipine (NORVASC) 10 MG tablet Take 1 tablet (10 mg total) by mouth at bedtime. 09/02/16   Hildred Priest, MD  atorvastatin (LIPITOR) 40 MG tablet Take 1 tablet (40 mg total) by mouth at bedtime. 09/02/16   Hildred Priest, MD  calcium acetate (PHOSLO) 667 MG capsule Take 2 capsules (1,334 mg total) by mouth 3 (three) times daily with meals. 09/02/16   Hildred Priest, MD  carvedilol (COREG) 25 MG tablet Take 1 tablet (25 mg total) by mouth 2 (two) times daily with a meal. 09/02/16   Hildred Priest, MD  cloNIDine (CATAPRES) 0.1 MG tablet Take 1 tablet (0.1 mg total) by mouth every 8 (eight) hours. 09/02/16   Hildred Priest, MD  doxercalciferol (HECTOROL) 4 MCG/2ML injection Inject 0.5 mLs (1 mcg total) into the vein Every Tuesday,Thursday,and  Saturday with dialysis. 09/03/16   Hildred Priest, MD  furosemide (LASIX) 80 MG tablet Take 1 tablet (80 mg total) by mouth 2 (two) times daily. 09/02/16   Hildred Priest, MD  haloperidol (HALDOL) 0.5 MG tablet Take 1 tablet (0.5 mg total) by mouth every 8 (eight) hours as needed for agitation. 09/03/16   Hildred Priest, MD  insulin aspart (NOVOLOG) 100 UNIT/ML injection Inject 5 Units into the skin 3 (three) times daily with meals. 09/03/16   Hildred Priest, MD  insulin glargine (LANTUS) 100 UNIT/ML injection Inject 0.22 mLs (22 Units total) into the skin at bedtime. 09/03/16   Hildred Priest, MD  lidocaine-prilocaine (EMLA) cream Apply 1 application topically See admin instructions. Apply to dialysis site one hour prior to dialysis    [provider]  LORazepam (ATIVAN) 2 MG/ML injection Inject 1  mL (2 mg total) into the vein Every Tuesday,Thursday,and Saturday with dialysis. 09/03/16   Hildred Priest, MD  multivitamin (RENA-VIT) TABS tablet Take 1 tablet by mouth at bedtime. 09/02/16   Hildred Priest, MD  sertraline (ZOLOFT) 50 MG tablet Take 1 tablet (50 mg total) by mouth daily. 09/02/16   Hildred Priest, MD  traZODone (DESYREL) 50 MG tablet Take 0.5 tablets (25 mg total) by mouth at bedtime. 09/02/16   Hildred Priest, MD    Physical Exam: Vitals:   01/16/17 1118 01/16/17 1130 01/16/17 1215 01/16/17 1300  BP: (!) 176/74 (!) 181/77 (!) 184/93 (!) 170/75  Pulse:      Resp: (!) 23 18 14  (!) 9  Temp:      TempSrc:      SpO2:          Constitutional: NAD, calm, comfortable Vitals:   01/16/17 1118 01/16/17 1130 01/16/17 1215 01/16/17 1300  BP: (!) 176/74 (!) 181/77 (!) 184/93 (!) 170/75  Pulse:      Resp: (!) 23 18 14  (!) 9  Temp:      TempSrc:      SpO2:       Eyes: PERRL, lids and conjunctivae normal ENMT: Mucous membranes are moist. Posterior pharynx clear of any exudate  or lesions.Normal dentition.  Neck: normal, supple, no masses, no thyromegaly Respiratory: rhonchi all lobes, no wheezing, no crackles. Normal respiratory effort. No accessory muscle use.  Cardiovascular: Regular rate and rhythm, no murmurs / rubs / gallops. No extremity edema. 2+ pedal pulses. No carotid bruits.  Abdomen: no tenderness, no masses palpated. No hepatosplenomegaly. Bowel sounds positive.  Musculoskeletal: amputation left foot, no contractures. Swollen tender area left upper thigh 40x20 cm area nodular area center  Skin: no rashes, lesions, ulcers. No induration Neurologic: CN 2-12 grossly intact. Sensation intact, DTR normal. Strength 5/5 in all 4.  Psychiatric: Normal judgment and insight. Alert and oriented x 3. Normal mood.     Labs on Admission: I have personally reviewed following labs and imaging studies  CBC:  Recent Labs Lab 01/16/17 1007  WBC 12.1*  NEUTROABS 10.4*  HGB 6.6*  HCT 20.6*  MCV 86.6  PLT 300   Basic Metabolic Panel:  Recent Labs Lab 01/16/17 1007  NA 127*  K 5.9*  CL 88*  CO2 26  GLUCOSE 340*  BUN 36*  CREATININE 5.55*  CALCIUM 8.8*   GFR: CrCl cannot be calculated (Unknown ideal weight.). Liver Function Tests:  Recent Labs Lab 01/16/17 1007  AST 26  ALT 19  ALKPHOS 111  BILITOT 0.8  PROT 7.8  ALBUMIN 2.6*   No results for input(s): LIPASE, AMYLASE in the last 168 hours. No results for input(s): AMMONIA in the last 168 hours. Coagulation Profile:  Recent Labs Lab 01/16/17 1007  INR 1.29   Cardiac Enzymes:  Recent Labs Lab 01/16/17 1007  CKTOTAL 158   BNP (last 3 results) No results for input(s): PROBNP in the last 8760 hours. HbA1C: No results for input(s): HGBA1C in the last 72 hours. CBG:  Recent Labs Lab 01/16/17 1222  GLUCAP 383*   Lipid Profile: No results for input(s): CHOL, HDL, LDLCALC, TRIG, CHOLHDL, LDLDIRECT in the last 72 hours. Thyroid Function Tests: No results for input(s): TSH,  T4TOTAL, FREET4, T3FREE, THYROIDAB in the last 72 hours. Anemia Panel:  Recent Labs  01/16/17 1113  VITAMINB12 1,893*  FOLATE 34.0  FERRITIN 776*  TIBC 206*  IRON 36  RETICCTPCT 4.1*   Urine analysis:  Component Value Date/Time   COLORURINE YELLOW 01/01/2017 0054   APPEARANCEUR HAZY (A) 01/01/2017 0054   LABSPEC 1.013 01/01/2017 0054   PHURINE 8.0 01/01/2017 0054   GLUCOSEU >=500 (A) 01/01/2017 0054   HGBUR SMALL (A) 01/01/2017 0054   BILIRUBINUR NEGATIVE 01/01/2017 0054   BILIRUBINUR neg 12/13/2014 1127   KETONESUR NEGATIVE 01/01/2017 0054   PROTEINUR >=300 (A) 01/01/2017 0054   UROBILINOGEN 0.2 05/01/2015 1252   NITRITE NEGATIVE 01/01/2017 0054   LEUKOCYTESUR NEGATIVE 01/01/2017 0054   Sepsis Labs: @LABRCNTIP (procalcitonin:4,lacticidven:4) ) no sepsis criteria  Radiological Exams on Admission: Dg Femur Min 2 Views Left  Result Date: 01/16/2017 CLINICAL DATA:  Pain, soft tissue mass, ongoing for 2 weeks EXAM: LEFT FEMUR 2 VIEWS COMPARISON:  None. FINDINGS: No fracture or dislocation. No aggressive lytic or sclerotic osseous lesion. Peripheral vascular atherosclerotic disease. No focal soft tissue abnormality. IMPRESSION: No acute osseous injury of the left femur. If there is persisting concern regarding soft tissue abnormality recommend evaluation with MRI. Electronically Signed   By: Kathreen Devoid   On: 01/16/2017 10:40    EKG: Independently reviewed.   Assessment/Plan  Cellulitis left leg    MRi pending Vancomycin and Maxipen     ESRF Nephrology consulted for dialysis today  Gi bleeding   Most likely upper gi bleeding second to aspirin use  Protonix  Gi consulted Pt refuses blood transfusion. (Pt reports she was born [place] witness and does not want blood produts  Anemia   Hemoglobin 6.6.  Pt has active bleeding.   Procrit  Pt does not want blood  transfusion.  Pt reports she was born [place] witness and does not want blood products. Pt has  chronic anemia second to renal failure.    Type 2 Diabetes  SSI insulin while in hospital will resume home medication at discharge  Chronic pain  Resume home medications at discharge, I will treat acute pain while in hospital     DVT prophylaxis: scd on left  Code Status: Full code Family Communication: no family present Disposition Plan: Home when stabilized Consults called: [facility] Nephrology Pallative care GI  Admission status: Inpatient   MERRELL, Grayling Congress MD [facility] Pager [phone]  If 7PM-7AM, please contact night-coverage [email or web address] Password [facility]  01/16/2017, 2:20 PM    Attending MD note  Patient was seen, examined,treatment plan was discussed with the  Advance Practice Provider.  I have personally reviewed the clinical findings, lab,EKG, imaging studies and management of this patient in detail.I have also reviewed the orders written for this patient which were under my direction. I agree with the documentation, as recorded by the Advance Practice Provider.   [name] is a 51 y.o. female who presents with celluliits and potentially deep abscess of the L leg. CT scan pending. May need surgical/IR consult depending on results. Of note patient also presenting with symptomatic anemia secondary to GI bleed with chronic anemia from renal failure. Patient is a Sales promotion account executive Witness and will not accept a blood transfusion. GI and nephrology consulted.    MERRELL, Grayling Congress, MD Family Medicine See Amion for pager # [facility]

## 2017-01-16 NOTE — Progress Notes (Signed)
Pt just arrived to the floor from hemodialysis via stretcher. Ambulated well to the bed with minimal assist. Denies pain at this time.

## 2017-01-17 ENCOUNTER — Encounter (HOSPITAL_COMMUNITY)
Admission: EM | Disposition: A | Discharge status: Home / Self Care | Payer: Self-pay | Source: Home / Self Care | Attending: Family Medicine

## 2017-01-17 DIAGNOSIS — L03119 Cellulitis of unspecified part of limb: Secondary | ICD-10-CM

## 2017-01-17 DIAGNOSIS — Z515 Encounter for palliative care: Secondary | ICD-10-CM

## 2017-01-17 DIAGNOSIS — L02419 Cutaneous abscess of limb, unspecified: Secondary | ICD-10-CM

## 2017-01-17 DIAGNOSIS — K5730 Diverticulosis of large intestine without perforation or abscess without bleeding: Secondary | ICD-10-CM

## 2017-01-17 DIAGNOSIS — K644 Residual hemorrhoidal skin tags: Secondary | ICD-10-CM

## 2017-01-17 DIAGNOSIS — Z7189 Other specified counseling: Secondary | ICD-10-CM

## 2017-01-17 DIAGNOSIS — Z992 Dependence on renal dialysis: Secondary | ICD-10-CM

## 2017-01-17 HISTORY — PX: FLEXIBLE SIGMOIDOSCOPY: SHX5431

## 2017-01-17 LAB — CBC
HEMATOCRIT: 20.6 % — AB (ref 36.0–46.0)
HEMOGLOBIN: 6.2 g/dL — AB (ref 12.0–15.0)
MCH: 27 pg (ref 26.0–34.0)
MCHC: 30.1 g/dL (ref 30.0–36.0)
MCV: 89.6 fL (ref 78.0–100.0)
Platelets: 290 10*3/uL (ref 150–400)
RBC: 2.3 MIL/uL — ABNORMAL LOW (ref 3.87–5.11)
RDW: 19.5 % — ABNORMAL HIGH (ref 11.5–15.5)
WBC: 9 10*3/uL (ref 4.0–10.5)

## 2017-01-17 LAB — BASIC METABOLIC PANEL
ANION GAP: 11 (ref 5–15)
BUN: 14 mg/dL (ref 6–20)
CO2: 28 mmol/L (ref 22–32)
Calcium: 8.3 mg/dL — ABNORMAL LOW (ref 8.9–10.3)
Chloride: 93 mmol/L — ABNORMAL LOW (ref 101–111)
Creatinine, Ser: 3.32 mg/dL — ABNORMAL HIGH (ref 0.44–1.00)
GFR calc Af Amer: 18 mL/min — ABNORMAL LOW (ref >60)
GFR, EST NON AFRICAN AMERICAN: 15 mL/min — AB (ref >60)
GLUCOSE: 230 mg/dL — AB (ref 65–99)
POTASSIUM: 5 mmol/L (ref 3.5–5.1)
Sodium: 132 mmol/L — ABNORMAL LOW (ref 135–145)

## 2017-01-17 LAB — GLUCOSE, CAPILLARY
Glucose-Capillary: 263 mg/dL — ABNORMAL HIGH (ref 65–99)
Glucose-Capillary: 113 mg/dL — ABNORMAL HIGH (ref 65–99)
Glucose-Capillary: 138 mg/dL — ABNORMAL HIGH (ref 65–99)

## 2017-01-17 SURGERY — SIGMOIDOSCOPY, FLEXIBLE
Anesthesia: Moderate Sedation

## 2017-01-17 MED ORDER — MORPHINE SULFATE (PF) 2 MG/ML IV SOLN
1.0000 mg | Freq: Once | INTRAVENOUS | Status: AC
  Administered 2017-01-17: 1 mg via INTRAVENOUS
  Filled 2017-01-17: qty 1

## 2017-01-17 MED ORDER — SODIUM CHLORIDE 0.9 % IV SOLN
INTRAVENOUS | Status: DC
  Administered 2017-01-17 (×2): via INTRAVENOUS

## 2017-01-17 MED ORDER — FENTANYL CITRATE (PF) 100 MCG/2ML IJ SOLN
INTRAMUSCULAR | Status: AC
  Filled 2017-01-17: qty 2

## 2017-01-17 MED ORDER — MORPHINE SULFATE (PF) 2 MG/ML IV SOLN
1.0000 mg | INTRAVENOUS | Status: DC | PRN
  Administered 2017-01-17: 1 mg via INTRAVENOUS
  Filled 2017-01-17: qty 1

## 2017-01-17 MED ORDER — NEPRO/CARBSTEADY PO LIQD
237.0000 mL | Freq: Two times a day (BID) | ORAL | Status: DC
  Administered 2017-01-17 – 2017-01-24 (×11): 237 mL via ORAL

## 2017-01-17 MED ORDER — INSULIN GLARGINE 100 UNIT/ML ~~LOC~~ SOLN
13.0000 [IU] | Freq: Every day | SUBCUTANEOUS | Status: DC
  Administered 2017-01-17 – 2017-01-24 (×8): 13 [IU] via SUBCUTANEOUS
  Filled 2017-01-17 (×8): qty 0.13

## 2017-01-17 MED ORDER — HYDROCORTISONE 2.5 % RE CREA
TOPICAL_CREAM | Freq: Two times a day (BID) | RECTAL | Status: DC
  Administered 2017-01-17: 22:00:00 via RECTAL
  Administered 2017-01-22: 1 via RECTAL
  Filled 2017-01-17: qty 28.35

## 2017-01-17 MED ORDER — MIDAZOLAM HCL 5 MG/ML IJ SOLN
INTRAMUSCULAR | Status: AC
  Filled 2017-01-17: qty 2

## 2017-01-17 MED ORDER — FENTANYL CITRATE (PF) 100 MCG/2ML IJ SOLN
INTRAMUSCULAR | Status: DC | PRN
  Administered 2017-01-17: 25 ug via INTRAVENOUS

## 2017-01-17 MED ORDER — MORPHINE SULFATE (PF) 2 MG/ML IV SOLN
1.0000 mg | INTRAVENOUS | Status: DC | PRN
  Administered 2017-01-17 (×2): 2 mg via INTRAVENOUS
  Filled 2017-01-17 (×2): qty 1

## 2017-01-17 MED ORDER — MORPHINE SULFATE (PF) 2 MG/ML IV SOLN
2.0000 mg | Freq: Once | INTRAVENOUS | Status: AC
  Administered 2017-01-17: 2 mg via INTRAVENOUS
  Filled 2017-01-17: qty 1

## 2017-01-17 MED ORDER — OXYCODONE HCL 5 MG PO TABS
5.0000 mg | ORAL_TABLET | Freq: Four times a day (QID) | ORAL | Status: DC | PRN
  Administered 2017-01-18 – 2017-01-20 (×7): 10 mg via ORAL
  Filled 2017-01-17 (×7): qty 2

## 2017-01-17 MED ORDER — MIDAZOLAM HCL 10 MG/2ML IJ SOLN
INTRAMUSCULAR | Status: DC | PRN
  Administered 2017-01-17: 2 mg via INTRAVENOUS

## 2017-01-17 NOTE — Progress Notes (Signed)
PROGRESS NOTE    [name]  WIO:[id number] DOB: 04-Jun-1966 DOA: 01/16/2017 PCP: Patient, [name]     Brief Narrative:  [name] is a 51 y.o. female with medical history significant of ESRD, CHF, chronic pain, Type 2 diabetes mellitus on insulin, cellulitis of left leg, anemia, HTN and cocaine use.  Pt complains of severe pain in her left leg.  Pt reports she has been having pain for over 2 weeks.  Pt was scheduled for dialysis and decided to come here instead to get help for leg pain.  She was admitted for further evaluation and treatment for her left leg cellulitis. 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 Nephrology was also consulted due to her ESRD requiring dialysis. Palliative care consulted for goals of care discussions.  Assessment & Plan:   Principal Problem:   Cellulitis of left leg Active Problems:   Anemia of chronic kidney failure   Chronic pain syndrome   ESRD (end stage renal disease) (HCC)   Diabetes mellitus with complication (HCC)   Upper GI bleed  Left leg cellulitis, with abscess vs hematoma  -MRI left femur showed fluid collection 400cc  -Continue IV antibiotics, vanco, maxipime  -IR consulted for aspiration/drainage -Blood cultures pending -Pain control   Lower GI bleed -Stop all aspirin, ibuprofen -GI consulted, planning for flexible sigmoidoscopy today -No blood transfusions due to being Je[employer]'s Witness -BID PPI  Blood loss anemia -As above, continue to trend CBC  ESRD TThS -Consult nephrology for dialysis.  Type 2 diabetes, with hyperglycemia -Lantus  -Sliding-scale insulin  Essential hypertension. -Continue Norvasc, Coreg, catapres   Hyperlipidemia -Continue Lipitor  Depression/anxiety -Continue Zoloft, Ativan  Left plantar stump wound -Consult wound nurse, dressing change recommendations   DVT prophylaxis: SCD Code Status: Full Family Communication: No  family at bedside Disposition Plan: pending improvement, further work up    Consultants:   GI  Nephro  IR  Palliative care   Procedures:   None  Antimicrobials:  Anti-infectives    Start     Dose/Rate Route Frequency Ordered Stop   01/18/17 1800  ceFEPIme (MAXIPIME) 2 g in dextrose 5 % 50 mL IVPB     2 g 100 mL/hr over 30 Minutes Intravenous Every T-Th-Sa (1800) 01/16/17 1308     01/18/17 1200  vancomycin (VANCOCIN) IVPB 750 mg/150 ml premix  Status:  Discontinued     750 mg 150 mL/hr over 60 Minutes Intravenous Every T-Th-Sa (Hemodialysis) 01/16/17 1308 01/16/17 1509   01/16/17 1600  vancomycin (VANCOCIN) IVPB 750 mg/150 ml premix     750 mg 150 mL/hr over 60 Minutes Intravenous Every T-Th-Sa (Hemodialysis) 01/16/17 1509     01/16/17 1300  vancomycin (VANCOCIN) IVPB 1000 mg/200 mL premix  Status:  Discontinued     1,000 mg 200 mL/hr over 60 Minutes Intravenous  Once 01/16/17 1245 01/16/17 1246   01/16/17 1000  vancomycin (VANCOCIN) IVPB 1000 mg/200 mL premix     1,000 mg 200 mL/hr over 60 Minutes Intravenous  Once 01/16/17 0946 01/16/17 1221   01/16/17 0945  ceFEPIme (MAXIPIME) 2 g in dextrose 5 % 50 mL IVPB     2 g 100 mL/hr over 30 Minutes Intravenous  Once 01/16/17 0944 01/16/17 1120         Subjective: Patient's main complaint this morning is her left thigh pain. She states that the pain and warmth surrounding her thigh has been there for  about 2 weeks, has not noticed any draining from the area. She also admits to subjective fevers and chills, but no chest pain, no shortness of breath, cough, no nausea or vomiting. She admits to some dizziness. She has also noticed some dark/bloody stools. She states that she is a Sales promotion account executive Witness and does not want any blood products. She admits to taking Goody powder and ibuprofen at home prior to admission.  Objective: Vitals:   01/16/17 2110 01/17/17 0438 01/17/17 0735 01/17/17 0933  BP: (!) 161/65 (!) 145/52  (!) 122/49    Pulse: 93 99  81  Resp: 17 19  16   Temp:  (!) 101.4 F (38.6 C) (!) 101.1 F (38.4 C) (!) 100.5 F (38.1 C)  TempSrc:   Oral Oral  SpO2: 100% 91%  95%  Weight: 63.7 kg (140 lb 6.4 oz)       Intake/Output Summary (Last 24 hours) at 01/17/17 1337 Last data filed at 01/17/17 0941  Gross per 24 hour  Intake           741.09 ml  Output             3000 ml  Net         -2258.91 ml   Filed Weights   01/16/17 1425 01/16/17 1817 01/16/17 2110  Weight: 66.5 kg (146 lb 9.7 oz) 63.3 kg (139 lb 8.8 oz) 63.7 kg (140 lb 6.4 oz)    Examination:  General exam: Appears calm and comfortable  Respiratory system: Clear to auscultation. Respiratory effort normal. Cardiovascular system: S1 & S2 heard, RRR. No JVD, murmurs, rubs, gallops or clicks. No pedal edema. Gastrointestinal system: Abdomen is nondistended, soft and nontender. No organomegaly or masses felt. Normal bowel sounds heard. Central nervous system: Alert and oriented. No focal neurological deficits. Extremities: + Left thigh larger compared to right.  Skin: +Left thigh with erythema, induration medially. +left foot with plantar wound without drainage  Psychiatry: Judgement and insight appear normal. Mood & affect appropriate.   Data Reviewed: I have personally reviewed following labs and imaging studies  CBC:  Recent Labs Lab 01/16/17 1007 01/16/17 1452 01/17/17 0400  WBC 12.1* 8.9 9.0  NEUTROABS 10.4*  --   --   HGB 6.6* 5.8* 6.2*  HCT 20.6* 18.3* 20.6*  MCV 86.6 86.3 89.6  PLT 304 263 706   Basic Metabolic Panel:  Recent Labs Lab 01/16/17 1007 01/16/17 1300 01/16/17 1452 01/17/17 0400  NA 127*  --  128* 132*  K 5.9*  --  4.6 5.0  CL 88*  --  92* 93*  CO2 26  --  26 28  GLUCOSE 340*  --  193* 230*  BUN 36*  --  30* 14  CREATININE 5.55*  --  4.40* 3.32*  CALCIUM 8.8*  --  8.2* 8.3*  PHOS  --  3.7 2.7  --    GFR: Estimated Creatinine Clearance: 17.5 mL/min (A) (by C-G formula based on SCr of 3.32 mg/dL  (H)). Liver Function Tests:  Recent Labs Lab 01/16/17 1007 01/16/17 1300 01/16/17 1452  AST 26 26  --   ALT 19 18  --   ALKPHOS 111 107  --   BILITOT 0.8 0.9  --   PROT 7.8 8.2*  --   ALBUMIN 2.6* 2.6* 2.3*   No results for input(s): LIPASE, AMYLASE in the last 168 hours. No results for input(s): AMMONIA in the last 168 hours. Coagulation Profile:  Recent Labs Lab 01/16/17 1007  INR 1.29  Cardiac Enzymes:  Recent Labs Lab 01/16/17 1007  CKTOTAL 158   BNP (last 3 results) No results for input(s): PROBNP in the last 8760 hours. HbA1C: No results for input(s): HGBA1C in the last 72 hours. CBG:  Recent Labs Lab 01/16/17 1222 01/16/17 2115 01/17/17 0806 01/17/17 1237  GLUCAP 383* 222* 263* 113*   Lipid Profile: No results for input(s): CHOL, HDL, LDLCALC, TRIG, CHOLHDL, LDLDIRECT in the last 72 hours. Thyroid Function Tests: No results for input(s): TSH, T4TOTAL, FREET4, T3FREE, THYROIDAB in the last 72 hours. Anemia Panel:  Recent Labs  01/16/17 1113  VITAMINB12 1,893*  FOLATE 34.0  FERRITIN 776*  TIBC 206*  IRON 36  RETICCTPCT 4.1*   Sepsis Labs:  Recent Labs Lab 01/16/17 1019  LATICACIDVEN 0.82    Recent Results (from the past 240 hour(s))  Culture, blood (Routine X 2) w Reflex to ID Panel     Status: None (Preliminary result)   Collection Time: 01/16/17 10:00 AM  Result Value Ref Range Status   Specimen Description BLOOD RIGHT ANTECUBITAL  Final   Special Requests   Final    BOTTLES DRAWN AEROBIC AND ANAEROBIC Blood Culture adequate volume   Culture NO GROWTH 1 DAY  Final   Report Status PENDING  Incomplete  Culture, blood (Routine X 2) w Reflex to ID Panel     Status: None (Preliminary result)   Collection Time: 01/16/17 10:10 AM  Result Value Ref Range Status   Specimen Description BLOOD RIGHT HAND  Final   Special Requests   Final    BOTTLES DRAWN AEROBIC ONLY Blood Culture adequate volume   Culture NO GROWTH 1 DAY  Final    Report Status PENDING  Incomplete       Radiology Studies: Mr Femur Left Wo Contrast  Result Date: 01/16/2017 CLINICAL DATA:  Left leg mass over the past 2 weeks. Partial amputation of the left foot. EXAM: MR OF THE LEFT FEMUR WITHOUT CONTRAST TECHNIQUE: Multiplanar, multisequence MR imaging of the left upper leg/femur was performed. No intravenous contrast was administered. COMPARISON:  01/01/2017 FINDINGS: Despite efforts by the technologist and patient, motion artifact is present on today's exam and could not be eliminated. This reduces exam sensitivity and specificity. Bones/Joint/Cartilage No significant abnormal osseous edema. Moderate left and trace right knee joint effusion. Ligaments N/A Muscles and Tendons Extensive abnormal edema in the anterior compartmental musculature of the upper leg, with new lower progressive heterogeneous but high T2 signal intensity fluid collection primarily tracking within along the vastus lateralis and vastus intermedius muscles, measuring 7.2 by 5.6 by 18.9 cm (volume = 400 cm^3). This could represent abscess or hematoma. There is some disruption of portions of the vastus lateralis, and edema signal is again identified in all anterior compartmental musculature. There is also some edema tracking within and around the pectineus and adductor longus muscles. Soft tissues Subcutaneous edema in the upper thighs, left greater than right. IMPRESSION: 1. Abnormal extensive edema in the anterior compartment of the left upper leg, with some mild involvement of the medial compartment. Compartment syndrome and infection not excluded. Compared to the prior exam there has been progressive enlargement of a fluid collection primarily involving the vastus lateralis and vastus intermedius, current volume 400 cubic cm, potentially abscess or hematoma. 2. Moderate left and trace right knee effusions. Electronically Signed   By: Van Clines M.D.   On: 01/16/2017 14:48   Dg Femur  Min 2 Views Left  Result Date: 01/16/2017 CLINICAL DATA:  Pain, soft  tissue mass, ongoing for 2 weeks EXAM: LEFT FEMUR 2 VIEWS COMPARISON:  None. FINDINGS: No fracture or dislocation. No aggressive lytic or sclerotic osseous lesion. Peripheral vascular atherosclerotic disease. No focal soft tissue abnormality. IMPRESSION: No acute osseous injury of the left femur. If there is persisting concern regarding soft tissue abnormality recommend evaluation with MRI. Electronically Signed   By: Kathreen Devoid   On: 01/16/2017 10:40      Scheduled Meds: . amLODipine  10 mg Oral QHS  . atorvastatin  40 mg Oral QHS  . calcium acetate  1,334 mg Oral TID WC  . carvedilol  25 mg Oral BID WC  . cloNIDine  0.1 mg Oral Q8H  . darbepoetin (ARANESP) injection - DIALYSIS  200 mcg Intravenous Q Thu-HD  . [START ON 01/18/2017] doxercalciferol  1 mcg Intravenous Q T,Th,Sa-HD  . feeding supplement (NEPRO CARB STEADY)  237 mL Oral BID BM  . haloperidol  0.5 mg Oral QHS  . insulin aspart  0-15 Units Subcutaneous TID WC  . insulin aspart  0-5 Units Subcutaneous QHS  . insulin glargine  13 Units Subcutaneous Daily  . [START ON 01/18/2017] LORazepam  2 mg Intravenous Q T,Th,Sa-HD  . multivitamin  1 tablet Oral QHS  . [START ON 01/19/2017] pantoprazole  40 mg Intravenous Q12H  . sertraline  50 mg Oral Daily  . sodium chloride flush  3 mL Intravenous Q12H  . sodium chloride flush  3 mL Intravenous Q12H  . traZODone  25 mg Oral QHS   Continuous Infusions: . sodium chloride    . sodium chloride 20 mL/hr at 01/17/17 0354  . [START ON 01/18/2017] ceFEPime (MAXIPIME) IV    . [START ON 01/18/2017] ferric gluconate (FERRLECIT/NULECIT) IV    . pantoprozole (PROTONIX) infusion 8 mg/hr (01/17/17 0022)  . vancomycin 750 mg (01/16/17 1710)     LOS: 1 day    Time spent: 40 minutes   Dessa Phi, DO [facility] [email or web address] Password [email or web address] 01/17/2017, 1:37 PM

## 2017-01-17 NOTE — Progress Notes (Signed)
Inpatient Diabetes Program Recommendations  AACE/ADA: New Consensus Statement on Inpatient Glycemic Control (2015)  Target Ranges:  Prepandial:   less than 140 mg/dL      Peak postprandial:   less than 180 mg/dL (1-2 hours)      Critically ill patients:  140 - 180 mg/dL   Results for MAUI, AHART (MRN [id number]) as of 01/17/2017 10:42  Ref. Range 01/16/2017 12:22 01/16/2017 21:15 01/17/2017 08:06  Glucose-Capillary Latest Ref Range: 65 - 99 mg/dL 383 (H) 222 (H) 263 (H)  Results for KALEB, LINQUIST (MRN [id number]) as of 01/17/2017 10:42  Ref. Range 01/16/2017 10:07 01/16/2017 14:52 01/17/2017 04:00  Glucose Latest Ref Range: 65 - 99 mg/dL 340 (H) 193 (H) 230 (H)   Review of Glycemic Control  Diabetes history: DM Outpatient Diabetes medications: Lantus 22 units QHS, Novolog 5 units TID with meals Current orders for Inpatient glycemic control: Novolog 0-15 units TID with meals, Novolog 0-5 units QHS  Inpatient Diabetes Program Recommendations: Insulin - Basal: Please consider ordering Lantus 13 units Q24H starting now (based on 63.7 kg x 0.2 units).  Thanks, Barnie Alderman, RN, MSN, CDE Diabetes Coordinator Inpatient Diabetes Program [phone] (Team Pager from 8am to 5pm)

## 2017-01-17 NOTE — Consult Note (Signed)
   [facility] CM Inpatient Consult   01/17/2017  [name] [birth year] [id number]  Patient was assessed for multiple hospitalizations.  Patient listed with not having a primary care provider.  Met with patient at the bedside.  Patient alert and oriented but keeps her eyes closed after recognizing this Probation officer.  She states, "I feel so bad today.  I know I need to take better care of my self and follow up with you all [place].  She states that she is scheduled for surgery and to leave the brochure on her table with contact information.  Will follow up at a later date. Inpatient RNCM Malachy Mood made aware that patient listed with no primary care provider.  Will need to confirm patient's ACO registry.  For questions, please contact:  Natividad Brood, RN BSN [facility] Liaison  [phone] business mobile phone Toll free office [phone]

## 2017-01-17 NOTE — Interval H&P Note (Signed)
History and Physical Interval Note:  01/17/2017 4:50 PM  [name]  has presented today for surgery, with the diagnosis of rectal bleeding  The various methods of treatment have been discussed with the patient and family. After consideration of risks, benefits and other options for treatment, the patient has consented to  Procedure(s): FLEXIBLE SIGMOIDOSCOPY w/ hemorrhoid banding possible (N/A) as a surgical intervention .  The patient's history has been reviewed, patient examined, no change in status, stable for surgery.  I have reviewed the patient's chart and labs.  Questions were answered to the patient's satisfaction.     Silvano Rusk

## 2017-01-17 NOTE — Consult Note (Addendum)
[place] Nurse wound consult note Pt is familiar to [facility] team from recent admission; she previously had a full thickness wound with eschar to plantar stump area and Santyl was ordered at that time. Refer to progress notes on 5/30. Reason for Consult: Left plantar stump wound has improved, now with 4X4cm dry calloused area.  No open wound, drainage, or fluctuance. Patchy area of darker-colored brown dried scabbed skin over callous area Dressing procedure/placement/frequency: Foam dressing to protect from further injury.  Encouraged pt to obtain follow-up with a podiatrist after discharge to keep callous trimmed to skin level and prevent cracks in the skin.  Informed her to contact a physician if the location develops an open wound, odor, or drainage.  Pt verbalized understanding. Please re-consult if further assistance is needed.  Thank-you,  Julien Girt MSN, [place], [place], [place], [place]

## 2017-01-17 NOTE — Progress Notes (Signed)
[facility] Progress Note   Dialysis Orders:[facility] T,TH,S 3.5 hrs 63.5 kg 400/800  2.0 K/2.25 Ca UF profile 2 -Heparin: NONE -Mircera 200 mcg IV q 2 weeks (last dose 150 mcg IV 01/07/17) -Venofer 50 mg IV weekly (last dose 01/11/2017 Last Fe 36 Ferritin 876 Tsat 18% 12/26/16-rec'd venofer 100 mg IV X 8 doses) -Hectorol 2 mcg IV TIW (Last PTH 593 12/26/16)   BMD meds:  Calcium Acetate 667 mg 2 capsules TID AC Renvela 800 mg 3 tabs PO TID AC  Assessment/Plan: 1. Left thigh probable abscess vs hematoma - CK 158, febrile- blood cultures drawn; on empiric Vanc and Maxepime- for drainage today by IR- WBC better but still febrile 2. ESRD - TTS - HD tomorrow- no heparin K 5 3. Anemia JW/ Heme + by report - 17% sat , ferritin 776 - 6/14 - repleting- continue after d/c , continue max ESA- 200 Aranesp given 6/14 hgb 6.2 today - had been trending down at her outpt unit - on PPI; previously taking NSAIDS because not getting other pain meds - issue with chronic pain as well as acute pain from left thigh - GI following 4. Secondary hyperparathyroidism - on hectorol/current binders- only on phoslo at present P lower than usual  5. HTN/volume - tirtate to EDW /current meds- net UF 3 L Thursday 6. Nutrition -  Renal diet/vitsuppl- alb low- npo for procedure 7. DM - per primary 8. Psych - current meds  Myriam Jacobson, PA-C [place] [phone] 01/17/2017,10:10 AM  LOS: 1 day   Pt seen, examined and agree w A/P as above. Still having fevers, leg less swollen today per patient.  Radiology to see for poss aspiration / cultures.   Kelly Splinter MD [facility] pager [phone]   01/17/2017, 11:21 AM   Subjective:  Denies needle use with drugs - only smokes crack/weed.   Objective Vitals:   01/16/17 2110 01/17/17 0438 01/17/17 0735 01/17/17 0933  BP: (!) 161/65 (!) 145/52  (!) 122/49  Pulse: 93 99  81  Resp: 17 19  16   Temp:  (!) 101.4 F  (38.6 C) (!) 101.1 F (38.4 C) (!) 100.5 F (38.1 C)  TempSrc:   Oral Oral  SpO2: 100% 91%  95%  Weight: 63.7 kg (140 lb 6.4 oz)      Physical Exam General: NAD feels better today Heart: RRR Lungs: no rales Abdomen: soft NT Extremities: no sig edema LE, left thigh - swelling, less tender today, mild erythema Dialysis Access: left lower AVGG + bruit   Additional Objective Labs: Basic Metabolic Panel:  Recent Labs Lab 01/16/17 1007 01/16/17 1300 01/16/17 1452 01/17/17 0400  NA 127*  --  128* 132*  K 5.9*  --  4.6 5.0  CL 88*  --  92* 93*  CO2 26  --  26 28  GLUCOSE 340*  --  193* 230*  BUN 36*  --  30* 14  CREATININE 5.55*  --  4.40* 3.32*  CALCIUM 8.8*  --  8.2* 8.3*  PHOS  --  3.7 2.7  --    Liver Function Tests:  Recent Labs Lab 01/16/17 1007 01/16/17 1300 01/16/17 1452  AST 26 26  --   ALT 19 18  --   ALKPHOS 111 107  --   BILITOT 0.8 0.9  --   PROT 7.8 8.2*  --   ALBUMIN 2.6* 2.6* 2.3*   No results for input(s): LIPASE, AMYLASE in the last 168  hours. CBC:  Recent Labs Lab 01/16/17 1007 01/16/17 1452 01/17/17 0400  WBC 12.1* 8.9 9.0  NEUTROABS 10.4*  --   --   HGB 6.6* 5.8* 6.2*  HCT 20.6* 18.3* 20.6*  MCV 86.6 86.3 89.6  PLT 304 263 290   Blood Culture    Component Value Date/Time   SDES NOSE 01/01/2017 1400   SPECREQUEST  01/01/2017 1400    NONE Performed at [facility], [place] [address] [place], [place] [address]    CULT NO MRSA DETECTED 01/01/2017 1400   REPTSTATUS 01/03/2017 FINAL 01/01/2017 1400    Cardiac Enzymes:  Recent Labs Lab 01/16/17 1007  CKTOTAL 158   CBG:  Recent Labs Lab 01/16/17 1222 01/16/17 2115 01/17/17 0806  GLUCAP 383* 222* 263*   Iron Studies:  Recent Labs  01/16/17 1113  IRON 36  TIBC 206*  FERRITIN 776*   Lab Results  Component Value Date   INR 1.29 01/16/2017   INR 1.35 07/21/2016   INR 1.4 (A) 12/23/2015   INR 1.2 (A) 12/23/2015   PROTIME 17.3 (A) 12/23/2015    PROTIME 15.9 (A) 12/23/2015   Studies/Results: Mr Femur Left Wo Contrast  Result Date: 01/16/2017 CLINICAL DATA:  Left leg mass over the past 2 weeks. Partial amputation of the left foot. EXAM: MR OF THE LEFT FEMUR WITHOUT CONTRAST TECHNIQUE: Multiplanar, multisequence MR imaging of the left upper leg/femur was performed. No intravenous contrast was administered. COMPARISON:  01/01/2017 FINDINGS: Despite efforts by the technologist and patient, motion artifact is present on today's exam and could not be eliminated. This reduces exam sensitivity and specificity. Bones/Joint/Cartilage No significant abnormal osseous edema. Moderate left and trace right knee joint effusion. Ligaments N/A Muscles and Tendons Extensive abnormal edema in the anterior compartmental musculature of the upper leg, with new lower progressive heterogeneous but high T2 signal intensity fluid collection primarily tracking within along the vastus lateralis and vastus intermedius muscles, measuring 7.2 by 5.6 by 18.9 cm (volume = 400 cm^3). This could represent abscess or hematoma. There is some disruption of portions of the vastus lateralis, and edema signal is again identified in all anterior compartmental musculature. There is also some edema tracking within and around the pectineus and adductor longus muscles. Soft tissues Subcutaneous edema in the upper thighs, left greater than right. IMPRESSION: 1. Abnormal extensive edema in the anterior compartment of the left upper leg, with some mild involvement of the medial compartment. Compartment syndrome and infection not excluded. Compared to the prior exam there has been progressive enlargement of a fluid collection primarily involving the vastus lateralis and vastus intermedius, current volume 400 cubic cm, potentially abscess or hematoma. 2. Moderate left and trace right knee effusions. Electronically Signed   By: Van Clines M.D.   On: 01/16/2017 14:48   Dg Femur Min 2 Views  Left  Result Date: 01/16/2017 CLINICAL DATA:  Pain, soft tissue mass, ongoing for 2 weeks EXAM: LEFT FEMUR 2 VIEWS COMPARISON:  None. FINDINGS: No fracture or dislocation. No aggressive lytic or sclerotic osseous lesion. Peripheral vascular atherosclerotic disease. No focal soft tissue abnormality. IMPRESSION: No acute osseous injury of the left femur. If there is persisting concern regarding soft tissue abnormality recommend evaluation with MRI. Electronically Signed   By: Kathreen Devoid   On: 01/16/2017 10:40   Medications: . sodium chloride    . sodium chloride 20 mL/hr at 01/17/17 0354  . [START ON 01/18/2017] ceFEPime (MAXIPIME) IV    . [START ON 01/18/2017] ferric gluconate (  FERRLECIT/NULECIT) IV    . pantoprozole (PROTONIX) infusion 8 mg/hr (01/17/17 0022)  . vancomycin 750 mg (01/16/17 1710)   . amLODipine  10 mg Oral QHS  . atorvastatin  40 mg Oral QHS  . calcium acetate  1,334 mg Oral TID WC  . carvedilol  25 mg Oral BID WC  . cloNIDine  0.1 mg Oral Q8H  . darbepoetin (ARANESP) injection - DIALYSIS  200 mcg Intravenous Q Thu-HD  . [START ON 01/18/2017] doxercalciferol  1 mcg Intravenous Q T,Th,Sa-HD  . haloperidol  0.5 mg Oral QHS  . insulin aspart  0-15 Units Subcutaneous TID WC  . insulin aspart  0-5 Units Subcutaneous QHS  . [START ON 01/18/2017] LORazepam  2 mg Intravenous Q T,Th,Sa-HD  . multivitamin  1 tablet Oral QHS  . [START ON 01/19/2017] pantoprazole  40 mg Intravenous Q12H  . sertraline  50 mg Oral Daily  . sodium chloride flush  3 mL Intravenous Q12H  . sodium chloride flush  3 mL Intravenous Q12H  . traZODone  25 mg Oral QHS

## 2017-01-17 NOTE — Consult Note (Signed)
Consultation Note Date: 01/17/2017   Patient Name: [name]  DOB: 07-16-1966  MRN: [id number]  Age / Sex: 51 y.o., female  PCP: Patient, [name] Referring Physician: Baird Lyons*  Reason for Consultation: Establishing goals of care  HPI/Patient Profile: 51 y.o. female  with past medical history of ESRD on HD, CHF, chronic pain, DM, anemia, HTN, cellulitis of left leg, cocaine use admitted on 01/16/2017 with painful swollen left leg and black stool. Drain/aspirate left leg abscess/hematoma. Also  Clinical Assessment and Goals of Care: I met today with [name]. [name] has had so much pain with her left thigh with possible abscess. She tells me that her pain was so bad that she turned to street drugs to treat her pain. She does tell me that she was at the point of feeling so miserable in pain that she did not want to live and spoke with her doctor about stopping HD. However, she says that she has now had relief she does not feel like she wants to die. She is more hopeful now and wants to continue HD and living.   We also discussed her h/o of Jehovah Witness and refusing blood products but she has received them in the past. Clarified that she felt so guilty in the past that she would NOT want blood products even if this meant death.   She does say that she did NOT have any thoughts of harming herself (but has in the past). We discussed having resources to call if she did have any suicidal thoughts (CSW consulted) and she also plans to f/u with her mental health provider upon discharge. She has poor support. Emotional support provided.   Primary Decision Maker PATIENT    SUMMARY OF RECOMMENDATIONS   - Continue HD and hopeful for improvement - To follow up with her mental health provider - NO blood products  Code Status/Advance Care Planning:  Full code   Symptom Management:    Anemia: Accepting of all treatments except blood products.   GIB: GI following.   Pain: Would recommend dilaudid 0.5 mg every 4 hours prn. Avoid morphine in ESRD.   Palliative Prophylaxis:   Frequent Pain Assessment  Additional Recommendations (Limitations, Scope, Preferences):  Full Scope Treatment  Psycho-social/Spiritual:   Desire for further Chaplaincy support:yes  Additional Recommendations: Referral to Community Resources   Prognosis:   Unable to determine  Discharge Planning: To Be Determined      Primary Diagnoses: Present on Admission: . Cellulitis of left leg . ESRD (end stage renal disease) ([place]) . Chronic pain syndrome . Anemia of chronic kidney failure   I have reviewed the medical record, interviewed the patient and family, and examined the patient. The following aspects are pertinent.  Past Medical History:  Diagnosis Date  . Anemia   . Anxiety   . Bipolar disorder, unspecified ([place]) 02/06/2014  . Chronic combined systolic and diastolic CHF (congestive heart failure) ([facility])    EF 40% by echo and 48% by stress test  .  Chronic pain syndrome   . Depression   . ESRD on hemodialysis ([place])   . GERD (gastroesophageal reflux disease)   . Glaucoma   . Headache    sinus headaches  . Hepatitis C   . High cholesterol   . History of hiatal hernia   . History of vitamin D deficiency 06/20/2015  . Hypertension   . MRSA infection ?2014   "on the back of my head; spread throughout my bloodstream"  . Neuropathy   . Refusal of blood transfusions as patient is Jehovah's Witness   . Schizoaffective disorder, unspecified condition 02/08/2014  . Seizure [facility]) dx'd 2014   "don't know what kind; last one was ~ 04/2014"  . Stroke [facility])    TIA 2009  . TIA (transient ischemic attack) 2010  . Type II diabetes mellitus ([place])    INSULIN DEPENDENT   Social History   Social History  . Marital status: Single    Spouse name: N/A  . Number of children: N/A  .  Years of education: N/A   Social History Main Topics  . Smoking status: Former Smoker    Packs/day: 1.00    Years: 20.00    Types: Cigarettes    Quit date: 03/31/2015  . Smokeless tobacco: Never Used     Comment: "stopped smoking in ~ 2013; restarted last week; stopped again 06/17/2014"  . Alcohol use 1.8 oz/week    3 Standard drinks or equivalent per week     Comment: 05/01/15 patient denies alcohol use.  . Drug use: Yes    Types: "Crack" cocaine, Cocaine     Comment: last smoked crack 08/21/16  . Sexual activity: Not Currently    Birth control/ protection: Abstinence   Other Topics Concern  . None   Social History Narrative   ** Merged History Encounter **       Family History  Problem Relation Age of Onset  . Hypertension Mother   . Hypertension Father    Scheduled Meds: . amLODipine  10 mg Oral QHS  . atorvastatin  40 mg Oral QHS  . calcium acetate  1,334 mg Oral TID WC  . carvedilol  25 mg Oral BID WC  . cloNIDine  0.1 mg Oral Q8H  . darbepoetin (ARANESP) injection - DIALYSIS  200 mcg Intravenous Q Thu-HD  . [START ON 01/18/2017] doxercalciferol  1 mcg Intravenous Q T,Th,Sa-HD  . feeding supplement (NEPRO CARB STEADY)  237 mL Oral BID BM  . haloperidol  0.5 mg Oral QHS  . insulin aspart  0-15 Units Subcutaneous TID WC  . insulin aspart  0-5 Units Subcutaneous QHS  . insulin glargine  13 Units Subcutaneous Daily  . [START ON 01/18/2017] LORazepam  2 mg Intravenous Q T,Th,Sa-HD  . multivitamin  1 tablet Oral QHS  . [START ON 01/19/2017] pantoprazole  40 mg Intravenous Q12H  . sertraline  50 mg Oral Daily  . sodium chloride flush  3 mL Intravenous Q12H  . sodium chloride flush  3 mL Intravenous Q12H  . traZODone  25 mg Oral QHS   Continuous Infusions: . sodium chloride    . sodium chloride 20 mL/hr at 01/17/17 0354  . [START ON 01/18/2017] ceFEPime (MAXIPIME) IV    . [START ON 01/18/2017] ferric gluconate (FERRLECIT/NULECIT) IV    . pantoprozole (PROTONIX) infusion  8 mg/hr (01/17/17 0022)  . vancomycin 750 mg (01/16/17 1710)   PRN Meds:.sodium chloride, acetaminophen **OR** acetaminophen, albuterol, morphine injection, sodium chloride flush Allergies  Allergen Reactions  .  Penicillins Hives and Other (See Comments)    Has patient had a PCN reaction causing immediate rash, facial/tongue/throat swelling, SOB or lightheadedness with hypotension: Yes Has patient had a PCN reaction causing severe rash involving mucus membranes or skin necrosis: No Has patient had a PCN reaction that required hospitalization No Has patient had a PCN reaction occurring within the last 10 years: No If all of the above answers are "NO", then may proceed with Cephalosporin use. Pt tolerates cefepime and ceftriaxone  . Gabapentin Itching and Swelling  . Lyrica [Pregabalin] Swelling and Other (See Comments)    Facial and leg swelling  . Saphris [Asenapine] Swelling and Other (See Comments)    Facial swelling  . Tramadol Swelling and Other (See Comments)    Leg swelling  . Other Other (See Comments)    NO BLOOD PRODUCTS. PT IS A JEHOVAH WITNESS   Review of Systems  Constitutional: Positive for activity change and fatigue. Negative for appetite change.  Gastrointestinal: Positive for blood in stool.  Musculoskeletal:       Left leg pain and swelling    Physical Exam  Constitutional: She is oriented to person, place, and time. She appears well-developed.  HENT:  Head: Normocephalic and atraumatic.  Cardiovascular: Normal rate and regular rhythm.   Pulmonary/Chest: Effort normal. No accessory muscle usage. No tachypnea. No respiratory distress.  Abdominal: Normal appearance.  Neurological: She is alert and oriented to person, place, and time.  Nursing note and vitals reviewed.   Vital Signs: BP (!) 122/49 (BP Location: Right Arm)   Pulse 81   Temp (!) 100.5 F (38.1 C) (Oral)   Resp 16   Wt 63.7 kg (140 lb 6.4 oz)   LMP  (LMP Unknown)   SpO2 95%   BMI 24.10  kg/m  Pain Assessment: 0-10   Pain Score: 7    SpO2: SpO2: 95 % O2 Device:SpO2: 95 % O2 Flow Rate: .   IO: Intake/output summary:  Intake/Output Summary (Last 24 hours) at 01/17/17 1249 Last data filed at 01/17/17 0941  Gross per 24 hour  Intake           741.09 ml  Output             3000 ml  Net         -2258.91 ml    LBM: Last BM Date: 01/14/17 Baseline Weight: Weight: 66.5 kg (146 lb 9.7 oz) Most recent weight: Weight: 63.7 kg (140 lb 6.4 oz)     Palliative Assessment/Data:    Time Total: 50%  Greater than 50%  of this time was spent counseling and coordinating care related to the above assessment and plan.  Signed by: Vinie Sill, NP Palliative Medicine Team Pager # [phone] (M-F 8a-5p) Team Phone # [phone] (Nights/Weekends)

## 2017-01-17 NOTE — Consult Note (Signed)
Chief Complaint: Patient was seen in consultation today for left thigh abscess aspiration/drain placement Chief Complaint  Patient presents with  . Wound Infection   at the request of Dr Dillon Bjork  Referring Physician(s): Dr Dillon Bjork  Supervising Physician: Corrie Mckusick  Patient Status: [facility] - In-pt  History of Present Illness: [name] is a 51 y.o. female   ESRD; cocaine abuse; chronic anemia For endoscopy today Known Left thigh tissue edema since 01/01/17 US imaging Comes to ED 6/14 with increasing left leg pain MRI yesterday: 1. Abnormal extensive edema in the anterior compartment of the left upper leg, with some mild involvement of the medial compartment. Compartment syndrome and infection not excluded. Compared to the prior exam there has been progressive enlargement of a fluid collection primarily involving the vastus lateralis and vastus intermedius, current volume 400 cubic cm, potentially abscess or hematoma. 2. Moderate left and trace right knee effusions.  Now request for aspiration possible drain placement Imaging reviewed with Dr Henrene Dodge aspiration definitely, POSSIBLE drain  Past Medical History:  Diagnosis Date  . Anemia   . Anxiety   . Bipolar disorder, unspecified ([place]) 02/06/2014  . Chronic combined systolic and diastolic CHF (congestive heart failure) (HCC)    EF 40% by echo and 48% by stress test  . Chronic pain syndrome   . Depression   . ESRD on hemodialysis ([place])   . GERD (gastroesophageal reflux disease)   . Glaucoma   . Headache    sinus headaches  . Hepatitis C   . High cholesterol   . History of hiatal hernia   . History of vitamin D deficiency 06/20/2015  . Hypertension   . MRSA infection ?2014   "on the back of my head; spread throughout my bloodstream"  . Neuropathy   . Refusal of blood transfusions as patient is Jehovah's Witness   . Schizoaffective disorder, unspecified condition 02/08/2014  . Seizure [facility]) dx'd  2014   "don't know what kind; last one was ~ 04/2014"  . Stroke [facility])    TIA 2009  . TIA (transient ischemic attack) 2010  . Type II diabetes mellitus ([place])    INSULIN DEPENDENT    Past Surgical History:  Procedure Laterality Date  . ABDOMINAL HYSTERECTOMY  2014  . ABDOMINAL HYSTERECTOMY  2010  . AMPUTATION Left 05/21/2013   Procedure: Left Midfoot AMPUTATION;  Surgeon: Newt Minion, MD;  Location: [place];  Service: Orthopedics;  Laterality: Left;  Left Midfoot Amputation  . AV FISTULA PLACEMENT Left 05/04/2015   Procedure: ARTERIOVENOUS (AV) GRAFT INSERTION;  Surgeon: Angelia Mould, MD;  Location: [place];  Service: Vascular;  Laterality: Left;  . COLONOSCOPY WITH PROPOFOL N/A 06/22/2013   Procedure: COLONOSCOPY WITH PROPOFOL;  Surgeon: Jeryl Columbia, MD;  Location: [facility] ENDOSCOPY;  Service: Endoscopy;  Laterality: N/A;  . ESOPHAGOGASTRODUODENOSCOPY N/A 05/11/2015   Procedure: ESOPHAGOGASTRODUODENOSCOPY (EGD);  Surgeon: Laurence Spates, MD;  Location: [facility] ENDOSCOPY;  Service: Endoscopy;  Laterality: N/A;  . EYE SURGERY Bilateral 2010   Lasik  . FOOT AMPUTATION Left    due diabetic neuropathy, could not feel ulcer on bottom of foot  . REFRACTIVE SURGERY Bilateral 2013  . TONSILLECTOMY  1970's  . TUBAL LIGATION  2000    Allergies: Penicillins; Gabapentin; Lyrica [pregabalin]; Saphris [asenapine]; Tramadol; and Other  Medications: Prior to Admission medications   Medication Sig Start Date End Date Taking? Authorizing Provider  albuterol (PROVENTIL HFA;VENTOLIN HFA) 108 (90 Base) MCG/ACT inhaler Inhale 2  puffs into the lungs every 4 (four) hours as needed for wheezing or shortness of breath. 10/15/16  Yes Horton, Barbette Hair, MD  amLODipine (NORVASC) 10 MG tablet Take 1 tablet (10 mg total) by mouth at bedtime. 09/02/16  Yes Hildred Priest, MD  atorvastatin (LIPITOR) 40 MG tablet Take 1 tablet (40 mg total) by mouth at bedtime. 09/02/16  Yes Hildred Priest, MD    calcium acetate (PHOSLO) 667 MG capsule Take 2 capsules (1,334 mg total) by mouth 3 (three) times daily with meals. 09/02/16  Yes Hildred Priest, MD  carvedilol (COREG) 25 MG tablet Take 1 tablet (25 mg total) by mouth 2 (two) times daily with a meal. 09/02/16  Yes Hildred Priest, MD  cloNIDine (CATAPRES) 0.1 MG tablet Take 1 tablet (0.1 mg total) by mouth every 8 (eight) hours. 09/02/16  Yes Hildred Priest, MD  furosemide (LASIX) 80 MG tablet Take 1 tablet (80 mg total) by mouth 2 (two) times daily. Patient taking differently: Take 80 mg by mouth 2 (two) times daily as needed for fluid or edema.  09/02/16  Yes Hildred Priest, MD  haloperidol (HALDOL) 0.5 MG tablet Take 1 tablet (0.5 mg total) by mouth every 8 (eight) hours as needed for agitation. Patient taking differently: Take 0.5 mg by mouth at bedtime.  09/03/16  Yes Hildred Priest, MD  insulin aspart (NOVOLOG) 100 UNIT/ML injection Inject 5 Units into the skin 3 (three) times daily with meals. 09/03/16  Yes Hildred Priest, MD  insulin glargine (LANTUS) 100 UNIT/ML injection Inject 0.22 mLs (22 Units total) into the skin at bedtime. 09/03/16  Yes Hildred Priest, MD  multivitamin (RENA-VIT) TABS tablet Take 1 tablet by mouth at bedtime. 09/02/16  Yes Hildred Priest, MD  sertraline (ZOLOFT) 50 MG tablet Take 1 tablet (50 mg total) by mouth daily. 09/02/16  Yes Hildred Priest, MD  traZODone (DESYREL) 50 MG tablet Take 0.5 tablets (25 mg total) by mouth at bedtime. 09/02/16  Yes Hildred Priest, MD  doxercalciferol (HECTOROL) 4 MCG/2ML injection Inject 0.5 mLs (1 mcg total) into the vein Every Tuesday,Thursday,and Saturday with dialysis. 09/03/16   Hildred Priest, MD  lidocaine-prilocaine (EMLA) cream Apply 1 application topically See admin instructions. Apply to dialysis site one hour prior to dialysis    [provider]   LORazepam (ATIVAN) 2 MG/ML injection Inject 1 mL (2 mg total) into the vein Every Tuesday,Thursday,and Saturday with dialysis. 09/03/16   Hildred Priest, MD     Family History  Problem Relation Age of Onset  . Hypertension Mother   . Hypertension Father     Social History   Social History  . Marital status: Single    Spouse name: N/A  . Number of children: N/A  . Years of education: N/A   Social History Main Topics  . Smoking status: Former Smoker    Packs/day: 1.00    Years: 20.00    Types: Cigarettes    Quit date: 03/31/2015  . Smokeless tobacco: Never Used     Comment: "stopped smoking in ~ 2013; restarted last week; stopped again 06/17/2014"  . Alcohol use 1.8 oz/week    3 Standard drinks or equivalent per week     Comment: 05/01/15 patient denies alcohol use.  . Drug use: Yes    Types: "Crack" cocaine, Cocaine     Comment: last smoked crack 08/21/16  . Sexual activity: Not Currently    Birth control/ protection: Abstinence   Other Topics Concern  . None   Social History Narrative   **  Merged History Encounter **        Review of Systems: A 12 point ROS discussed and pertinent positives are indicated in the HPI above.  All other systems are negative.  Review of Systems  Constitutional: Positive for activity change and fever. Negative for appetite change and fatigue.  Respiratory: Negative for cough and shortness of breath.   Cardiovascular: Negative for chest pain.  Gastrointestinal: Negative for abdominal pain.  Musculoskeletal: Positive for gait problem. Negative for back pain.       Has had partial left foot amputation 2 yrs ago  Psychiatric/Behavioral: Negative for behavioral problems and confusion.    Vital Signs: BP (!) 145/52   Pulse 99   Temp (!) 101.1 F (38.4 C) (Oral) Comment: Dr. Maylene Roes aware  Resp 19   Wt 140 lb 6.4 oz (63.7 kg)   LMP  (LMP Unknown)   SpO2 91%   BMI 24.10 kg/m   Physical Exam  Constitutional: She is oriented  to person, place, and time.  Cardiovascular: Normal rate, regular rhythm and normal heart sounds.   Pulmonary/Chest: Effort normal and breath sounds normal.  Abdominal: Soft. Bowel sounds are normal.  Musculoskeletal: Normal range of motion. She exhibits deformity.  Partial left foot amputation  Palpable left thigh mass; bulging noted  Neurological: She is alert and oriented to person, place, and time.  Skin: Skin is warm and dry.  Psychiatric: She has a normal mood and affect. Her behavior is normal. Judgment and thought content normal.  Nursing note and vitals reviewed.   Mallampati Score:  MD Evaluation Airway: WNL Heart: WNL Abdomen: WNL Chest/ Lungs: WNL ASA  Classification: 3 Mallampati/Airway Score: One  Imaging: Mr Femur Left Wo Contrast  Result Date: 01/16/2017 CLINICAL DATA:  Left leg mass over the past 2 weeks. Partial amputation of the left foot. EXAM: MR OF THE LEFT FEMUR WITHOUT CONTRAST TECHNIQUE: Multiplanar, multisequence MR imaging of the left upper leg/femur was performed. No intravenous contrast was administered. COMPARISON:  01/01/2017 FINDINGS: Despite efforts by the technologist and patient, motion artifact is present on today's exam and could not be eliminated. This reduces exam sensitivity and specificity. Bones/Joint/Cartilage No significant abnormal osseous edema. Moderate left and trace right knee joint effusion. Ligaments N/A Muscles and Tendons Extensive abnormal edema in the anterior compartmental musculature of the upper leg, with new lower progressive heterogeneous but high T2 signal intensity fluid collection primarily tracking within along the vastus lateralis and vastus intermedius muscles, measuring 7.2 by 5.6 by 18.9 cm (volume = 400 cm^3). This could represent abscess or hematoma. There is some disruption of portions of the vastus lateralis, and edema signal is again identified in all anterior compartmental musculature. There is also some edema  tracking within and around the pectineus and adductor longus muscles. Soft tissues Subcutaneous edema in the upper thighs, left greater than right. IMPRESSION: 1. Abnormal extensive edema in the anterior compartment of the left upper leg, with some mild involvement of the medial compartment. Compartment syndrome and infection not excluded. Compared to the prior exam there has been progressive enlargement of a fluid collection primarily involving the vastus lateralis and vastus intermedius, current volume 400 cubic cm, potentially abscess or hematoma. 2. Moderate left and trace right knee effusions. Electronically Signed   By: Van Clines M.D.   On: 01/16/2017 14:48   Mr Femur Left Wo Contrast  Result Date: 01/01/2017 CLINICAL DATA:  Increasing left thigh pain and swelling for 2 days in patient with a history of end-stage  renal disease and drug abuse. EXAM: MR OF THE LEFT FEMUR WITHOUT CONTRAST TECHNIQUE: Multiplanar, multisequence MR imaging of the left upper leg was performed. No intravenous contrast was administered. COMPARISON:  None. FINDINGS: Bones/Joint/Cartilage The axial and coronal sequences incidentally include the right upper leg. Bone marrow signal is normal without fracture, stress change or evidence of osteomyelitis. No avascular necrosis of the femoral heads is identified. Ligaments Negative. Muscles and Tendons Intense edema is present throughout the musculature of the anterior compartment of left upper leg. No intramuscular fluid collection is identified. Much milder degree of edema is seen in the distal right vastus lateralis and intermedius. No muscle or tendon tear or strain is identified. Soft tissues Bilateral upper leg subcutaneous edema is much worse on the left. Small bilateral knee joint effusions are identified. IMPRESSION: Edema throughout the anterior compartment musculature of the left thigh most worrisome for rhabdomyolysis. Compartment syndrome and infectious myositis far  possible but thought less likely. Much milder degree of edema is present in the right vastus lateralis and intermedius. Negative for abscess or osteomyelitis. Electronically Signed   By: Inge Rise M.D.   On: 01/01/2017 08:18   Dg Chest Portable 1 View  Result Date: 12/31/2016 CLINICAL DATA:  Acute onset of hypoxia.  Initial encounter. EXAM: PORTABLE CHEST 1 VIEW COMPARISON:  Chest radiograph performed 10/15/2016 FINDINGS: The lungs are well-aerated. Vascular congestion is noted. Hazy right basilar airspace opacity may reflect mild interstitial edema or possibly pneumonia. There is no evidence of pleural effusion or pneumothorax. The cardiomediastinal silhouette is mildly enlarged. No acute osseous abnormalities are seen. IMPRESSION: Vascular congestion and mild cardiomegaly. Hazy right basilar airspace opacity may reflect mild interstitial edema or possibly pneumonia. Electronically Signed   By: Garald Balding M.D.   On: 12/31/2016 23:46   [place] Soft Tissue Non Vascular  Result Date: 01/01/2017 CLINICAL DATA:  Assess left thigh cellulitis.  Initial encounter. EXAM: ULTRASOUND LEFT LOWER EXTREMITY LIMITED TECHNIQUE: Ultrasound examination of the lower extremity soft tissues was performed in the area of clinical concern. COMPARISON:  None. FINDINGS: Joint Space: Not assessed. Muscles: There is vague edema about the superficial musculature. Tendons: Not assessed. Other Soft Tissue Structures: Mild soft tissue edema is noted tracking about the fat overlying the musculature of the left lower thigh. There is no evidence of hyperemia. There is no evidence of abscess. IMPRESSION: Mild soft tissue edema noted, tracking to the superficial musculature of the left lower thigh. No evidence of abscess or hyperemia. Electronically Signed   By: Garald Balding M.D.   On: 01/01/2017 02:18   Dg Femur Min 2 Views Left  Result Date: 01/16/2017 CLINICAL DATA:  Pain, soft tissue mass, ongoing for 2  weeks EXAM: LEFT FEMUR 2 VIEWS COMPARISON:  None. FINDINGS: No fracture or dislocation. No aggressive lytic or sclerotic osseous lesion. Peripheral vascular atherosclerotic disease. No focal soft tissue abnormality. IMPRESSION: No acute osseous injury of the left femur. If there is persisting concern regarding soft tissue abnormality recommend evaluation with MRI. Electronically Signed   By: Kathreen Devoid   On: 01/16/2017 10:40   Dg Femur Min 2 Views Left  Result Date: 12/31/2016 CLINICAL DATA:  Initial evaluation for recent fall, pain. EXAM: LEFT FEMUR 2 VIEWS COMPARISON:  None. FINDINGS: No acute fracture or dislocation. Limited views of the hip and knee are grossly unremarkable. No acute soft tissue abnormality. Prominent vascular calcifications noted. IMPRESSION: 1. No acute fracture or dislocation. 2. Prominent soft tissue atherosclerotic changes for  patient age. Electronically Signed   By: Jeannine Boga M.D.   On: 12/31/2016 19:02    Labs:  CBC:  Recent Labs  01/02/17 0709 01/16/17 1007 01/16/17 1452 01/17/17 0400  WBC 5.2 12.1* 8.9 9.0  HGB 9.9* 6.6* 5.8* 6.2*  HCT 30.7* 20.6* 18.3* 20.6*  PLT 280 304 263 290    COAGS:  Recent Labs  07/21/16 0915 01/16/17 1007  INR 1.35 1.29    BMP:  Recent Labs  01/02/17 0709 01/16/17 1007 01/16/17 1452 01/17/17 0400  NA 129* 127* 128* 132*  K 4.6 5.9* 4.6 5.0  CL 85* 88* 92* 93*  CO2 29 26 26 28   GLUCOSE 62* 340* 193* 230*  BUN 54* 36* 30* 14  CALCIUM 8.1* 8.8* 8.2* 8.3*  CREATININE 8.51* 5.55* 4.40* 3.32*  GFRNONAA 5* 8* 11* 15*  GFRAA 6* 9* 12* 18*    LIVER FUNCTION TESTS:  Recent Labs  08/26/16 0005  12/31/16 2155 01/02/17 0709 01/16/17 1007 01/16/17 1300 01/16/17 1452  BILITOT 0.8  --  0.9  --  0.8 0.9  --   AST 26  --  25  --  26 26  --   ALT 24  --  18  --  19 18  --   ALKPHOS 146*  --  120  --  111 107  --   PROT 6.7  --  7.6  --  7.8 8.2*  --   ALBUMIN 3.0*  < > 3.0* 2.4* 2.6* 2.6* 2.3*  < > =  values in this interval not displayed.  TUMOR MARKERS: No results for input(s): AFPTM, CEA, CA199, CHROMGRNA in the last 8760 hours.  Assessment and Plan:  Left thigh abscess Noted on MRI Scheduled now for aspiration/drain placement Risks and Benefits discussed with the patient including bleeding, infection, damage to adjacent structures, and sepsis. All of the patient's questions were answered, patient is agreeable to proceed. Consent signed and in chart.   Thank you for this interesting consult.  I greatly enjoyed meeting ALENAH SARRIA and look forward to participating in their care.  A copy of this report was sent to the requesting provider on this date.  Electronically Signed: Lavonia Drafts, PA-C 01/17/2017, 8:34 AM   I spent a total of 40 Minutes    in face to face in clinical consultation, greater than 50% of which was counseling/coordinating care for left thigh abscess aspiration/drain

## 2017-01-17 NOTE — Op Note (Signed)
[facility] Patient Name: [name] Procedure Date : 01/17/2017 MRN: [id number] Attending MD: Gatha Mayer , MD Date of Birth: April 16, 1966 CSN: [id number] Age: 51 Admit Type: Inpatient Procedure:                Flexible Sigmoidoscopy Indications:              Rectal hemorrhage Providers:                Gatha Mayer, MD, Kingsley Plan, RN, Lillie Fragmin, RN, Marcene Duos, Technician Referring MD:              Medicines:                Midazolam 2 mg IV, Fentanyl 25 micrograms IV, None Complications:            No immediate complications. Estimated Blood Loss:     Estimated blood loss: none. Procedure:                Pre-Anesthesia Assessment:                           - Prior to the procedure, a History and Physical                            was performed, and patient medications and                            allergies were reviewed. The patient's tolerance of                            previous anesthesia was also reviewed. The risks                            and benefits of the procedure and the sedation                            options and risks were discussed with the patient.                            All questions were answered, and informed consent                            was obtained. Prior Anticoagulants: The patient has                            taken no previous anticoagulant or antiplatelet                            agents. ASA Grade Assessment: III - A patient with                            severe systemic disease. After reviewing the risks  and benefits, the patient was deemed in                            satisfactory condition to undergo the procedure.                           After obtaining informed consent, the scope was                            passed under direct vision. The Endoscope was                            introduced through the anus and advanced to the the                       sigmoid colon. The flexible sigmoidoscopy was                            accomplished without difficulty. The patient                            tolerated the procedure well. The quality of the                            bowel preparation was fair. Scope In: 4:57:17 PM Scope Out: 5:01:12 PM Total Procedure Duration: 0 hours 3 minutes 55 seconds  Findings:      The perianal exam findings include skin tags.      The digital rectal exam findings include decreased sphincter tone.      External hemorrhoids were found during retroflexion. The hemorrhoids       were small.      Multiple small and large-mouthed diverticula were found in the sigmoid       colon.      The exam was otherwise without abnormality. Impression:               - Preparation of the colon was fair.                           - Perianal skin tags found on perianal exam.                           - Decreased sphincter tone found on digital rectal                            exam.                           - External hemorrhoids. Seem to have been source of                            bleeding - not suitable to band these -                           - Diverticulosis in the sigmoid colon.                           -  The examination was otherwise normal. Brown stool                            seen - no signs of blood at all                           - No specimens collected. Recommendation:           - Use HC Cream 2.5%: Apply into rectum BID. No                            further GI eval given her overall situation Procedure Code(s):        --- Professional ---                           [phone], Sigmoidoscopy, flexible; diagnostic,                            including collection of specimen(s) by brushing or                            washing, when performed (separate procedure) Diagnosis Code(s):        --- Professional ---                           K64.4, Residual hemorrhoidal skin tags                            K62.89, Other specified diseases of anus and rectum                           K62.5, Hemorrhage of anus and rectum                           K57.30, Diverticulosis of large intestine without                            perforation or abscess without bleeding CPT copyright 2016 [employer]. All rights reserved. The codes documented in this report are preliminary and upon coder review may  be revised to meet current compliance requirements. Gatha Mayer, MD 01/17/2017 5:08:43 PM This report has been signed electronically. Number of Addenda: 0

## 2017-01-17 NOTE — Progress Notes (Signed)
Pharmacy Antibiotic Note [name] is a 51 y.o. female admitted on 01/16/2017 with concern for LE cellulitis. Currently on day 2 of Cefepime and vancomycin. ESRD on TTS,.   Plan: Vancomycin 750mg  post-HD on TTS Cefepime 2gm post-HD on TTS  Weight: 140 lb 6.4 oz (63.7 kg)  Temp (24hrs), Avg:99.5 F (37.5 C), Min:98.1 F (36.7 C), Max:101.4 F (38.6 C)   Recent Labs Lab 01/16/17 1007 01/16/17 1019 01/16/17 1452 01/17/17 0400  WBC 12.1*  --  8.9 9.0  CREATININE 5.55*  --  4.40* 3.32*  LATICACIDVEN  --  0.82  --   --     Estimated Creatinine Clearance: 17.5 mL/min (A) (by C-G formula based on SCr of 3.32 mg/dL (H)).    Allergies  Allergen Reactions  . Penicillins Hives and Other (See Comments)    Has patient had a PCN reaction causing immediate rash, facial/tongue/throat swelling, SOB or lightheadedness with hypotension: Yes Has patient had a PCN reaction causing severe rash involving mucus membranes or skin necrosis: No Has patient had a PCN reaction that required hospitalization No Has patient had a PCN reaction occurring within the last 10 years: No If all of the above answers are "NO", then may proceed with Cephalosporin use. Pt tolerates cefepime and ceftriaxone  . Gabapentin Itching and Swelling  . Lyrica [Pregabalin] Swelling and Other (See Comments)    Facial and leg swelling  . Saphris [Asenapine] Swelling and Other (See Comments)    Facial swelling  . Tramadol Swelling and Other (See Comments)    Leg swelling  . Other Other (See Comments)    NO BLOOD PRODUCTS. PT IS A [employer] WITNESS    Antimicrobials this admission: 6/14 Cefepime >>  6/14 vancomycin >>   Microbiology results: 6/14 BCx:  6/14 MRSA PCR:   Thank you for allowing pharmacy to be a part of this patient's care.  Vincenza Hews, PharmD, BCPS 01/17/2017, 7:52 AM Phone# [phone]

## 2017-01-17 NOTE — H&P (View-Only) (Signed)
Referring Provider: [facility]   Primary Care Physician:  Patient, [name] Primary Gastroenterologist:   Unassigned Reason for Consultation: rectal bleeding and anemia  ASSESSMENT AND PLAN:    27. 51 yo female with chronic, intermittent rectal bleeding and acute on chronic normocytic anemia. Baseline hgb difficult to determine but ranges 8-10, down to 5.8 today. No blood in stool on exam. She has been taking Ibuprofen ( chronic) plus took [place] powders a few days ago.  - She may need repeat EGD and if negative, a colonoscopy especially since she will not take blood.  - IV PPI. BID would be okay since not actively bleeding though gtt already started.  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 ESRD, on HD. Dialyzing now.   3. LLE cellulitis, on antibiotics.    4. Chronic pain. Taking NSAIDS now because no longer getting pain meds.      [facility] Attending   I have taken an interval history, reviewed the chart and examined the patient. I agree with the Advanced Practitioner's note, impression and recommendations.   Sounds like lower GI bleeding with acute blood loss anemia + chronic anemia and I am suspicious of hemorrhoids or possibly ischemia in her case. Flex sig, possible hemorrhoid banding tomorrow.  She does have dyspeptic sxs but does not sound like UGI bleed.  The risks and benefits as well as alternatives of endoscopic procedure(s) have been discussed and reviewed. All questions answered. The patient agrees to proceed.  Gatha Mayer, MD, [facility] [phone] (pager) [phone] after 5 PM, weekends and holidays  01/16/2017 6:48 PM    HPI: [name] is a 51 y.o. female with DM, HTN, CHF, ESRD on HD. She has Bipolar disorder and schizoaffective disorder, HCV and cocaine use. Patient has been seen several times in the hospital for evaluation of rectal bleeding.  This time the rectal bleeding started two weeks ago. She  doesn't have a BM everyday but each time she has there has been blood on stool. Describes clots of blood but denies black stool. . She has chronic abdominal pain and that is unchanged. She has been taking ibuprofen at home and a few days ago added in some goody powders. Since taking the Goody powders her chest has been burning, especially when she eats. Throat feels raw. Some nausea which she attributes to antibiotics. Admitted today for ongoing LLE. She was hospitalized two weeks ago with left thigh cellulitis and has been on antibiotics.    Endoscopic workup :  EGD 2015 for upper abdominal pain was unremarkable.   Colonoscopy 2014 for rectal bleeding was unremarkable except for hemorrhoids though prep was poor.   Past Medical History:  Diagnosis Date  . Anemia   . Anxiety   . Bipolar disorder, unspecified ([place]) 02/06/2014  . Chronic combined systolic and diastolic CHF (congestive heart failure) (HCC)    EF 40% by echo and 48% by stress test  . Chronic pain syndrome   . Depression   . ESRD on hemodialysis ([place])   . GERD (gastroesophageal reflux disease)   . Glaucoma   . Headache    sinus headaches  . Hepatitis C   . High cholesterol   . History of hiatal hernia   . History of vitamin D deficiency 06/20/2015  . Hypertension   . MRSA infection ?2014   "on the back of my head; spread throughout my bloodstream"  . Neuropathy   . Refusal of  blood transfusions as patient is Jehovah's Witness   . Schizoaffective disorder, unspecified condition 02/08/2014  . Seizure [facility]) dx'd 2014   "don't know what kind; last one was ~ 04/2014"  . Stroke [facility])    TIA 2009  . TIA (transient ischemic attack) 2010  . Type II diabetes mellitus ([place])    INSULIN DEPENDENT    Past Surgical History:  Procedure Laterality Date  . ABDOMINAL HYSTERECTOMY  2014  . ABDOMINAL HYSTERECTOMY  2010  . AMPUTATION Left 05/21/2013   Procedure: Left Midfoot AMPUTATION;  Surgeon: Newt Minion, MD;  Location: [place];   Service: Orthopedics;  Laterality: Left;  Left Midfoot Amputation  . AV FISTULA PLACEMENT Left 05/04/2015   Procedure: ARTERIOVENOUS (AV) GRAFT INSERTION;  Surgeon: Angelia Mould, MD;  Location: [place];  Service: Vascular;  Laterality: Left;  . COLONOSCOPY WITH PROPOFOL N/A 06/22/2013   Procedure: COLONOSCOPY WITH PROPOFOL;  Surgeon: Jeryl Columbia, MD;  Location: [facility] ENDOSCOPY;  Service: Endoscopy;  Laterality: N/A;  . ESOPHAGOGASTRODUODENOSCOPY N/A 05/11/2015   Procedure: ESOPHAGOGASTRODUODENOSCOPY (EGD);  Surgeon: Laurence Spates, MD;  Location: [facility] ENDOSCOPY;  Service: Endoscopy;  Laterality: N/A;  . EYE SURGERY Bilateral 2010   Lasik  . FOOT AMPUTATION Left    due diabetic neuropathy, could not feel ulcer on bottom of foot  . REFRACTIVE SURGERY Bilateral 2013  . TONSILLECTOMY  1970's  . TUBAL LIGATION  2000    Prior to Admission medications   Medication Sig Start Date End Date Taking? Authorizing Provider  albuterol (PROVENTIL HFA;VENTOLIN HFA) 108 (90 Base) MCG/ACT inhaler Inhale 2 puffs into the lungs every 4 (four) hours as needed for wheezing or shortness of breath. 10/15/16  Yes Horton, Barbette Hair, MD  amLODipine (NORVASC) 10 MG tablet Take 1 tablet (10 mg total) by mouth at bedtime. 09/02/16  Yes Hildred Priest, MD  atorvastatin (LIPITOR) 40 MG tablet Take 1 tablet (40 mg total) by mouth at bedtime. 09/02/16  Yes Hildred Priest, MD  calcium acetate (PHOSLO) 667 MG capsule Take 2 capsules (1,334 mg total) by mouth 3 (three) times daily with meals. 09/02/16  Yes Hildred Priest, MD  carvedilol (COREG) 25 MG tablet Take 1 tablet (25 mg total) by mouth 2 (two) times daily with a meal. 09/02/16  Yes Hildred Priest, MD  cloNIDine (CATAPRES) 0.1 MG tablet Take 1 tablet (0.1 mg total) by mouth every 8 (eight) hours. 09/02/16  Yes Hildred Priest, MD  furosemide (LASIX) 80 MG tablet Take 1 tablet (80 mg total) by mouth 2 (two) times  daily. Patient taking differently: Take 80 mg by mouth 2 (two) times daily as needed for fluid or edema.  09/02/16  Yes Hildred Priest, MD  haloperidol (HALDOL) 0.5 MG tablet Take 1 tablet (0.5 mg total) by mouth every 8 (eight) hours as needed for agitation. Patient taking differently: Take 0.5 mg by mouth at bedtime.  09/03/16  Yes Hildred Priest, MD  insulin aspart (NOVOLOG) 100 UNIT/ML injection Inject 5 Units into the skin 3 (three) times daily with meals. 09/03/16  Yes Hildred Priest, MD  insulin glargine (LANTUS) 100 UNIT/ML injection Inject 0.22 mLs (22 Units total) into the skin at bedtime. 09/03/16  Yes Hildred Priest, MD  multivitamin (RENA-VIT) TABS tablet Take 1 tablet by mouth at bedtime. 09/02/16  Yes Hildred Priest, MD  sertraline (ZOLOFT) 50 MG tablet Take 1 tablet (50 mg total) by mouth daily. 09/02/16  Yes Hildred Priest, MD  traZODone (DESYREL) 50 MG tablet Take 0.5 tablets (25 mg  total) by mouth at bedtime. 09/02/16  Yes Hildred Priest, MD  doxercalciferol (HECTOROL) 4 MCG/2ML injection Inject 0.5 mLs (1 mcg total) into the vein Every Tuesday,Thursday,and Saturday with dialysis. 09/03/16   Hildred Priest, MD  lidocaine-prilocaine (EMLA) cream Apply 1 application topically See admin instructions. Apply to dialysis site one hour prior to dialysis    [provider]  LORazepam (ATIVAN) 2 MG/ML injection Inject 1 mL (2 mg total) into the vein Every Tuesday,Thursday,and Saturday with dialysis. 09/03/16   Hildred Priest, MD    Current Facility-Administered Medications  Medication Dose Route Frequency Provider Last Rate Last Dose  . Darbepoetin Alfa (ARANESP) 200 MCG/0.4ML injection           . 0.9 %  sodium chloride infusion  100 mL Intravenous PRN Valentina Gu, NP      . 0.9 %  sodium chloride infusion  100 mL Intravenous PRN Valentina Gu, NP      . 0.9 %   sodium chloride infusion  250 mL Intravenous PRN Sofia, Leslie K, PA-C      . acetaminophen (TYLENOL) tablet 650 mg  650 mg Oral Q6H PRN Caryl Ada K, PA-C       Or  . acetaminophen (TYLENOL) suppository 650 mg  650 mg Rectal Q6H PRN Caryl Ada K, PA-C      . albuterol (PROVENTIL) (2.5 MG/3ML) 0.083% nebulizer solution 3 mL  3 mL Inhalation Q4H PRN Caryl Ada K, PA-C      . alteplase (CATHFLO ACTIVASE) injection 2 mg  2 mg Intracatheter Once PRN Valentina Gu, NP      . amLODipine (NORVASC) tablet 10 mg  10 mg Oral QHS Sofia, Leslie K, PA-C      . atorvastatin (LIPITOR) tablet 40 mg  40 mg Oral QHS Sofia, Leslie K, [place]      . calcium acetate (PHOSLO) capsule 1,334 mg  1,334 mg Oral TID WC Caryl Ada K, PA-C      . carvedilol (COREG) tablet 25 mg  25 mg Oral BID WC Sofia, Leslie K, PA-C      . [START ON 01/18/2017] ceFEPIme (MAXIPIME) 2 g in dextrose 5 % 50 mL IVPB  2 g Intravenous Q T,Th,Sat-1800 Rumbarger, Rachel L, RPH      . cloNIDine (CATAPRES) tablet 0.1 mg  0.1 mg Oral [address], [place], [place]      . Darbepoetin Alfa (ARANESP) injection 200 mcg  200 mcg Intravenous Q Thu-HD Valentina Gu, NP   200 mcg at 01/16/17 1557  . [START ON 01/18/2017] doxercalciferol (HECTOROL) injection 1 mcg  1 mcg Intravenous Q T,Th,Sa-HD Sofia, Leslie K, PA-C      . haloperidol (HALDOL) tablet 0.5 mg  0.5 mg Oral QHS Sofia, Leslie K, [place]      . heparin injection 1,000 Units  1,000 Units Dialysis PRN Valentina Gu, NP      . insulin aspart (novoLOG) injection 0-15 Units  0-15 Units Subcutaneous TID WC Sofia, Leslie K, PA-C      . insulin aspart (novoLOG) injection 0-5 Units  0-5 Units Subcutaneous QHS Sofia, Leslie K, PA-C      . lidocaine (PF) (XYLOCAINE) 1 % injection 5 mL  5 mL Intradermal PRN Valentina Gu, NP      . lidocaine-prilocaine (EMLA) cream 1 application  1 application Topical PRN Valentina Gu, NP      . Derrill Memo ON 01/18/2017] LORazepam (ATIVAN)  injection 2 mg  2 mg Intravenous  Q T,Th,Sa-HD Caryl Ada K, PA-C      . morphine 4 MG/ML injection 4 mg  4 mg Intravenous Q1H PRN Tanna Furry, MD   4 mg at 01/16/17 1050  . morphine 4 MG/ML injection 4 mg  4 mg Intravenous Q4H PRN Fransico Meadow, PA-C   4 mg at 01/16/17 1458  . multivitamin (RENA-VIT) tablet 1 tablet  1 tablet Oral QHS Sofia, Leslie K, PA-C      . pantoprazole (PROTONIX) 80 mg in sodium chloride 0.9 % 250 mL (0.32 mg/mL) infusion  8 mg/hr Intravenous Continuous Tanna Furry, MD 25 mL/hr at 01/16/17 1140 8 mg/hr at 01/16/17 1140  . [START ON 01/19/2017] pantoprazole (PROTONIX) injection 40 mg  40 mg Intravenous Q12H Tanna Furry, MD      . pentafluoroprop-tetrafluoroeth [facility]) aerosol 1 application  1 application Topical PRN Valentina Gu, NP      . sertraline (ZOLOFT) tablet 50 mg  50 mg Oral Daily Sofia, Leslie K, [place]      . sodium chloride flush (NS) 0.9 % injection 3 mL  3 mL Intravenous Q12H Sofia, Leslie K, PA-C      . sodium chloride flush (NS) 0.9 % injection 3 mL  3 mL Intravenous Q12H Sofia, Leslie K, PA-C      . sodium chloride flush (NS) 0.9 % injection 3 mL  3 mL Intravenous PRN Sofia, Leslie K, PA-C      . traZODone (DESYREL) tablet 25 mg  25 mg Oral QHS Sofia, Leslie K, PA-C      . vancomycin (VANCOCIN) IVPB 750 mg/150 ml premix  750 mg Intravenous Q T,Th,Sa-HD Rozann Lesches, [facility]        Allergies as of 01/16/2017 - Review Complete 01/16/2017  Allergen Reaction Noted  . Penicillins Hives and Other (See Comments) 09/30/2014  . Gabapentin Itching and Swelling 02/09/2013  . Lyrica [pregabalin] Swelling and Other (See Comments) 09/30/2014  . Saphris [asenapine] Swelling and Other (See Comments) 09/30/2014  . Tramadol Swelling and Other (See Comments) 09/30/2014  . Other Other (See Comments) 05/01/2015    Family History  Problem Relation Age of Onset  . Hypertension Mother   . Hypertension Father     Social History   Social History  . Marital  status: Single    Spouse name: N/A  . Number of children: N/A  . Years of education: N/A   Occupational History  . Not on file.   Social History Main Topics  . Smoking status: Former Smoker    Packs/day: 1.00    Years: 20.00    Types: Cigarettes    Quit date: 03/31/2015  . Smokeless tobacco: Never Used     Comment: "stopped smoking in ~ 2013; restarted last week; stopped again 06/17/2014"  . Alcohol use 1.8 oz/week    3 Standard drinks or equivalent per week     Comment: 05/01/15 patient denies alcohol use.  . Drug use: Yes    Types: "Crack" cocaine, Cocaine     Comment: last smoked crack 08/21/16  . Sexual activity: Not Currently    Birth control/ protection: Abstinence   Other Topics Concern  . Not on file   Social History Narrative   ** Merged History Encounter **        Review of Systems: All systems reviewed and negative except where noted in HPI.  Physical Exam: Vital signs in last 24 hours: Temp:  [98.1 F (36.7 C)-98.6 F (37 C)] 98.1 F (36.7 C) (06/14  1425) Pulse Rate:  [78-84] 79 (06/14 1530) Resp:  [9-23] 16 (06/14 1425) BP: (127-190)/(73-93) 127/73 (06/14 1530) SpO2:  [96 %-100 %] 96 % (06/14 1425) Weight:  [146 lb 9.7 oz (66.5 kg)] 146 lb 9.7 oz (66.5 kg) (06/14 1425)   General:   Alert, well developed black female in NAD Psych:  Pleasant, cooperative. Normal mood and affect. Eyes:  Pupils equal, sclera clear, no icterus.   Conjunctiva pink. Mouth: no candida seen Ears:  Normal auditory acuity. Nose:  No deformity, discharge,  or lesions. Neck:  Supple; no masses Lungs:  Normal effort, bibasilar crackles. No wheezes Heart:  Regular rate and rhythm, no edema Abdomen:  Soft, non-distended, nontender, BS active, no palp mass    Ext: right elbow malformed.  partial amputation of left food Rectal:  Large hemorrhoidal tags. No stool, blood in vault.   Msk:  Symmetrical without gross deformities. . Neurologic:  Alert and  oriented x4;  grossly normal  neurologically.   Intake/Output from previous day: No intake/output data recorded. Intake/Output this shift: Total I/O In: 150 [IV Piggyback:150] Out: -   Lab Results:  Recent Labs  01/16/17 1007 01/16/17 1452  WBC 12.1* 8.9  HGB 6.6* 5.8*  HCT 20.6* 18.3*  PLT 304 263   BMET  Recent Labs  01/16/17 1007 01/16/17 1452  NA 127* 128*  K 5.9* 4.6  CL 88* 92*  CO2 26 26  GLUCOSE 340* 193*  BUN 36* 30*  CREATININE 5.55* 4.40*  CALCIUM 8.8* 8.2*   LFT  Recent Labs  01/16/17 1300 01/16/17 1452  PROT 8.2*  --   ALBUMIN 2.6* 2.3*  AST 26  --   ALT 18  --   ALKPHOS 107  --   BILITOT 0.9  --   BILIDIR 0.2  --   IBILI 0.7  --    PT/INR  Recent Labs  01/16/17 1007  LABPROT 16.2*  INR 1.29     Studies/Results: Mr Femur Left Wo Contrast  Result Date: 01/16/2017 CLINICAL DATA:  Left leg mass over the past 2 weeks. Partial amputation of the left foot. EXAM: MR OF THE LEFT FEMUR WITHOUT CONTRAST TECHNIQUE: Multiplanar, multisequence MR imaging of the left upper leg/femur was performed. No intravenous contrast was administered. COMPARISON:  01/01/2017 FINDINGS: Despite efforts by the technologist and patient, motion artifact is present on today's exam and could not be eliminated. This reduces exam sensitivity and specificity. Bones/Joint/Cartilage No significant abnormal osseous edema. Moderate left and trace right knee joint effusion. Ligaments N/A Muscles and Tendons Extensive abnormal edema in the anterior compartmental musculature of the upper leg, with new lower progressive heterogeneous but high T2 signal intensity fluid collection primarily tracking within along the vastus lateralis and vastus intermedius muscles, measuring 7.2 by 5.6 by 18.9 cm (volume = 400 cm^3). This could represent abscess or hematoma. There is some disruption of portions of the vastus lateralis, and edema signal is again identified in all anterior compartmental musculature. There is also some  edema tracking within and around the pectineus and adductor longus muscles. Soft tissues Subcutaneous edema in the upper thighs, left greater than right. IMPRESSION: 1. Abnormal extensive edema in the anterior compartment of the left upper leg, with some mild involvement of the medial compartment. Compartment syndrome and infection not excluded. Compared to the prior exam there has been progressive enlargement of a fluid collection primarily involving the vastus lateralis and vastus intermedius, current volume 400 cubic cm, potentially abscess or hematoma. 2. Moderate left and  trace right knee effusions. Electronically Signed   By: Van Clines M.D.   On: 01/16/2017 14:48   Dg Femur Min 2 Views Left  Result Date: 01/16/2017 CLINICAL DATA:  Pain, soft tissue mass, ongoing for 2 weeks EXAM: LEFT FEMUR 2 VIEWS COMPARISON:  None. FINDINGS: No fracture or dislocation. No aggressive lytic or sclerotic osseous lesion. Peripheral vascular atherosclerotic disease. No focal soft tissue abnormality. IMPRESSION: No acute osseous injury of the left femur. If there is persisting concern regarding soft tissue abnormality recommend evaluation with MRI. Electronically Signed   By: Kathreen Devoid   On: 01/16/2017 10:40    Tye Savoy, NP-C @  01/16/2017, 4:32 PM  Pager number [phone]

## 2017-01-18 ENCOUNTER — Encounter (HOSPITAL_COMMUNITY): Payer: Self-pay | Admitting: Internal Medicine

## 2017-01-18 ENCOUNTER — Inpatient Hospital Stay (HOSPITAL_COMMUNITY): Payer: Medicare Other

## 2017-01-18 LAB — CBC WITH DIFFERENTIAL/PLATELET
BASOS PCT: 1 %
Basophils Absolute: 0.1 10*3/uL (ref 0.0–0.1)
EOS PCT: 1 %
Eosinophils Absolute: 0.1 10*3/uL (ref 0.0–0.7)
HCT: 19.3 % — ABNORMAL LOW (ref 36.0–46.0)
HEMOGLOBIN: 5.9 g/dL — AB (ref 12.0–15.0)
Lymphocytes Relative: 15 %
Lymphs Abs: 1.3 10*3/uL (ref 0.7–4.0)
MCH: 27.2 pg (ref 26.0–34.0)
MCHC: 30.6 g/dL (ref 30.0–36.0)
MCV: 88.9 fL (ref 78.0–100.0)
Monocytes Absolute: 0.7 10*3/uL (ref 0.1–1.0)
Monocytes Relative: 8 %
NEUTROS PCT: 76 %
Neutro Abs: 6.5 10*3/uL (ref 1.7–7.7)
PLATELETS: 261 10*3/uL (ref 150–400)
RBC: 2.17 MIL/uL — AB (ref 3.87–5.11)
RDW: 19.6 % — ABNORMAL HIGH (ref 11.5–15.5)
WBC: 8.5 10*3/uL (ref 4.0–10.5)

## 2017-01-18 LAB — GLUCOSE, CAPILLARY
GLUCOSE-CAPILLARY: 112 mg/dL — AB (ref 65–99)
GLUCOSE-CAPILLARY: 138 mg/dL — AB (ref 65–99)
GLUCOSE-CAPILLARY: 155 mg/dL — AB (ref 65–99)
Glucose-Capillary: 239 mg/dL — ABNORMAL HIGH (ref 65–99)

## 2017-01-18 LAB — BASIC METABOLIC PANEL
Anion gap: 9 (ref 5–15)
BUN: 24 mg/dL — ABNORMAL HIGH (ref 6–20)
CHLORIDE: 93 mmol/L — AB (ref 101–111)
CO2: 28 mmol/L (ref 22–32)
CREATININE: 5.31 mg/dL — AB (ref 0.44–1.00)
Calcium: 8.4 mg/dL — ABNORMAL LOW (ref 8.9–10.3)
GFR, EST AFRICAN AMERICAN: 10 mL/min — AB (ref >60)
GFR, EST NON AFRICAN AMERICAN: 9 mL/min — AB (ref >60)
Glucose, Bld: 118 mg/dL — ABNORMAL HIGH (ref 65–99)
POTASSIUM: 4.6 mmol/L (ref 3.5–5.1)
Sodium: 130 mmol/L — ABNORMAL LOW (ref 135–145)

## 2017-01-18 MED ORDER — LIDOCAINE HCL (PF) 1 % IJ SOLN: 5.0000 mL | INTRAMUSCULAR | Status: DC | PRN

## 2017-01-18 MED ORDER — ALTEPLASE 2 MG IJ SOLR: 2.0000 mg | Freq: Once | INTRAMUSCULAR | Status: DC | PRN

## 2017-01-18 MED ORDER — SODIUM CHLORIDE 0.9 % IV SOLN: 100.0000 mL | INTRAVENOUS | Status: DC | PRN

## 2017-01-18 MED ORDER — DOXERCALCIFEROL 4 MCG/2ML IV SOLN
INTRAVENOUS | Status: AC
Start: 2017-01-18 — End: 2017-01-18
  Administered 2017-01-18: 1 ug via INTRAVENOUS
  Filled 2017-01-18: qty 2

## 2017-01-18 MED ORDER — FENTANYL CITRATE (PF) 100 MCG/2ML IJ SOLN
INTRAMUSCULAR | Status: AC
  Filled 2017-01-18: qty 2

## 2017-01-18 MED ORDER — VANCOMYCIN HCL IN DEXTROSE 750-5 MG/150ML-% IV SOLN
INTRAVENOUS | Status: AC
  Administered 2017-01-18: 750 mg via INTRAVENOUS
  Filled 2017-01-18: qty 150

## 2017-01-18 MED ORDER — LORAZEPAM 2 MG/ML IJ SOLN
INTRAMUSCULAR | Status: AC
  Administered 2017-01-18: 2 mg via INTRAVENOUS
  Filled 2017-01-18: qty 1

## 2017-01-18 MED ORDER — PANTOPRAZOLE SODIUM 20 MG PO TBEC
20.0000 mg | DELAYED_RELEASE_TABLET | Freq: Every day | ORAL | Status: DC
  Administered 2017-01-19 – 2017-01-24 (×6): 20 mg via ORAL
  Filled 2017-01-18 (×6): qty 1

## 2017-01-18 MED ORDER — LIDOCAINE-PRILOCAINE 2.5-2.5 % EX CREA: 1.0000 "application " | TOPICAL_CREAM | CUTANEOUS | Status: DC | PRN

## 2017-01-18 MED ORDER — MIDAZOLAM HCL 2 MG/2ML IJ SOLN
INTRAMUSCULAR | Status: AC
  Filled 2017-01-18: qty 2

## 2017-01-18 MED ORDER — LIDOCAINE HCL (PF) 1 % IJ SOLN
INTRAMUSCULAR | Status: AC
  Filled 2017-01-18: qty 10

## 2017-01-18 MED ORDER — HEPARIN SODIUM (PORCINE) 1000 UNIT/ML DIALYSIS: 1000.0000 [IU] | INTRAMUSCULAR | Status: DC | PRN

## 2017-01-18 MED ORDER — PENTAFLUOROPROP-TETRAFLUOROETH EX AERO: 1.0000 "application " | INHALATION_SPRAY | CUTANEOUS | Status: DC | PRN

## 2017-01-18 NOTE — Progress Notes (Signed)
HD RN stated that patients temp. Is currently 102.5. Tylenol given at approximately 0830 prior to HD. MD notified. HD RN stated that nephrologist ordered to repeat blood cultures. Will continue to monitor.

## 2017-01-18 NOTE — Progress Notes (Signed)
Patient had a witnessed fall at [place]. No injuries. MD notified. No further orders. Bed alarm on. Patient education provided. Will continue to monitor.

## 2017-01-18 NOTE — Progress Notes (Signed)
Patient continued to complain of severe left upper thigh pain.  She felt that the morphine she was given earlier in night had not touched her pain. IV flushed without problem.  Patient slept after morphine dose.  MD called and made aware.  Morphine IV order d/c'd.  Received order for oxycodone 5 - 10 mg PO.  This was given at 0335.  Will continue to monitor patient.  Earleen Reaper RN-BC, [employer]

## 2017-01-18 NOTE — Progress Notes (Signed)
HD tx completed @ 1240 w/o problem, UF goal met, blood rinsed back, report given to Dixie Dials, RN

## 2017-01-18 NOTE — Progress Notes (Signed)
Before fall, patient was encouraged multiple times to sit back and not get up on her own. Patient continued to state, "I'm fine. I can get up on my own." Patient then proceeded to stand even when I stated to sit down. Patient began straightening her bed. When I came around the bed to help the patient, she began to lose her balance and fell on her bottom. Patient assessed. No acute injuries. Will continue to monitor.

## 2017-01-18 NOTE — Procedures (Signed)
Interventional Radiology Procedure Note  Procedure: US guided aspiration of 30cc of complex fluid left lateral knee.   Findings:  [place] survey of the thigh shows phlegmon of the lateral musculature, with no focal fluid for drain placement.  Anechoic fluid at the left lateral knee, potentially effusion, though could not prove communication with the joint.   Aspiration of ~30cc of complex yellow fluid at the lateral knee.  Attempted aspiration of fluid within the deep thigh musculature, with no fluid returned.  Likely more phlegmon.  .  Complications: None Recommendations:  - Follow up culture - Routine wound care - Continue current care  Signed,  Dulcy Fanny. Earleen Newport, DO

## 2017-01-18 NOTE — Progress Notes (Signed)
Patient scheduled to go to IR today. HD notified. HD stated that patient would be second shift today. IR stated that they may be able to get patient at around 1300. HD notified. HD stated that they would go ahead and do HD this a.m. Report given. Will continue to monitor.

## 2017-01-18 NOTE — Progress Notes (Signed)
Pharmacy consulted to review PTA/Inpatient antiplatelet and anticoagulant agents and to resume appropriate meds now that pt is s/p IR procedure. Upon my review, patient is not taking any antiplatelets or anticoagulants PTA. Spoke with Dr. Maylene Roes - no indications inpatient either. Additionally, patient has a lower GIB and is refusing blood products for religious reasons.   Thank you for the consult,   Carlean Jews, Pharm.D. PGY1 Pharmacy Resident 6/16/20183:03 PM Pager [phone]

## 2017-01-18 NOTE — Progress Notes (Addendum)
HD tx initiated via 15G x2 and reversed per pt w/o problem,pt arrived w/ pre tx temp of 102.5, was aware pt had temp during report it was 101.2, made Juanell Fairly, NP aware of increase of temp and that primary nurse admin tylenol prior to me picking her up for tx, ok to start HD tx and she ordered blood cx x2,.  will cont to monitor while on HD tx

## 2017-01-18 NOTE — Procedures (Signed)
On dialysis, sleeping after ATivan 2mg .  For attempted I&D of L thigh abscess per IR after HD today.  Temp's 102 today.  A&Ox 3.     I was present at this dialysis session, have reviewed the session itself and made  appropriate changes Kelly Splinter MD [place] pager [phone]   01/18/2017, 1:11 PM

## 2017-01-18 NOTE — Progress Notes (Signed)
PROGRESS NOTE    [name]  WSF:[id number] DOB: 09/26/1965 DOA: 01/16/2017 PCP: Patient, [name]     Brief Narrative:  [name] is a 51 y.o. female with medical history significant of ESRD, CHF, chronic pain, Type 2 diabetes mellitus on insulin, cellulitis of left leg, anemia, HTN and cocaine use.  Pt complains of severe pain in her left leg.  Pt reports she has been having pain for over 2 weeks.  Pt was scheduled for dialysis and decided to come here instead to get help for leg pain.  She was admitted for further evaluation and treatment for her left leg cellulitis. 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 Nephrology was also consulted due to her ESRD requiring dialysis. Palliative care consulted for goals of care discussions.  Assessment & Plan:   Principal Problem:   Cellulitis of left leg Active Problems:   Anemia of chronic kidney failure   Chronic pain syndrome   ESRD (end stage renal disease) (HCC)   Diabetes mellitus with complication (HCC)   Upper GI bleed   External hemorrhoids   Diverticulosis of colon without hemorrhage   Goals of care, counseling/discussion   Palliative care encounter  Left leg cellulitis, with abscess vs hematoma  -MRI left femur showed fluid collection 400cc  -Continue IV antibiotics, vanco, maxipime  -IR consulted for aspiration/drainage, planned for 6/16  -Blood cultures NGTD  -Pain control   Lower GI bleed secondary to external hemorrhoids -Stop all aspirin, ibuprofen -S/p flex sig 6/15 found external hemorrhoids, source of bleed, not suitable to band, no sign of blood in colon  -No blood transfusions due to being [employer]  Blood loss anemia -As above, continue to trend CBC  ESRD TThS -Nephrology for dialysis  Type 2 diabetes, with hyperglycemia -Lantus  -Sliding-scale insulin  Essential hypertension. -Continue Norvasc, Coreg, catapres    Hyperlipidemia -Continue Lipitor  Depression/anxiety -Continue Zoloft, Ativan  Left plantar stump wound -Consult wound nurse, dressing change recommendations   DVT prophylaxis: SCD Code Status: Full Family Communication: No family at bedside Disposition Plan: pending improvement, further work up    Consultants:   GI  Nephro  IR  Palliative care   Procedures:   None  Antimicrobials:  Anti-infectives    Start     Dose/Rate Route Frequency Ordered Stop   01/18/17 1800  ceFEPIme (MAXIPIME) 2 g in dextrose 5 % 50 mL IVPB     2 g 100 mL/hr over 30 Minutes Intravenous Every T-Th-Sa (1800) 01/16/17 1308     01/18/17 1200  vancomycin (VANCOCIN) IVPB 750 mg/150 ml premix  Status:  Discontinued     750 mg 150 mL/hr over 60 Minutes Intravenous Every T-Th-Sa (Hemodialysis) 01/16/17 1308 01/16/17 1509   01/16/17 1600  vancomycin (VANCOCIN) IVPB 750 mg/150 ml premix     750 mg 150 mL/hr over 60 Minutes Intravenous Every T-Th-Sa (Hemodialysis) 01/16/17 1509     01/16/17 1300  vancomycin (VANCOCIN) IVPB 1000 mg/200 mL premix  Status:  Discontinued     1,000 mg 200 mL/hr over 60 Minutes Intravenous  Once 01/16/17 1245 01/16/17 1246   01/16/17 1000  vancomycin (VANCOCIN) IVPB 1000 mg/200 mL premix     1,000 mg 200 mL/hr over 60 Minutes Intravenous  Once 01/16/17 0946 01/16/17 1221   01/16/17 0945  ceFEPIme (MAXIPIME) 2 g in dextrose 5 % 50 mL IVPB     2 g 100 mL/hr over  30 Minutes Intravenous  Once 01/16/17 0944 01/16/17 1120         Subjective: Patient seen in dialysis unit. She had just received Ativan and is sleeping. Per nursing, patient had complained of severe left thigh pain. She did have an elevated temperature earlier this morning as well. No other acute events.  Objective: Vitals:   01/18/17 1230 01/18/17 1240 01/18/17 1307 01/18/17 1407  BP: (!) 95/53 122/68 (!) 103/56 (!) 124/58  Pulse: 67 68 69 71  Resp: 12 12 12  (!) 9  Temp:   98.3 F (36.8 C)    TempSrc:   Oral   SpO2: 100% 100% 100% 100%  Weight:   61.6 kg (135 lb 12.9 oz)   Height:        Intake/Output Summary (Last 24 hours) at 01/18/17 1408 Last data filed at 01/18/17 0830  Gross per 24 hour  Intake              850 ml  Output                0 ml  Net              850 ml   Filed Weights   01/17/17 2108 01/18/17 0853 01/18/17 1307  Weight: 66.4 kg (146 lb 6.2 oz) 64.6 kg (142 lb 6.7 oz) 61.6 kg (135 lb 12.9 oz)    Examination:  General exam: Appears calm and comfortable  Respiratory system: Clear to auscultation. Respiratory effort normal. Cardiovascular system: S1 & S2 heard, RRR. No JVD, murmurs, rubs, gallops or clicks. No pedal edema. Gastrointestinal system: Abdomen is nondistended, soft and nontender. No organomegaly or masses felt. Normal bowel sounds heard. Central nervous system: Alert and oriented. No focal neurological deficits. Extremities: + Left thigh larger compared to right.  Skin: +Left thigh with erythema, induration medially. +left foot with plantar wound without drainage  Psychiatry: Judgement and insight appear normal. Mood & affect appropriate.   Data Reviewed: I have personally reviewed following labs and imaging studies  CBC:  Recent Labs Lab 01/16/17 1007 01/16/17 1452 01/17/17 0400 01/18/17 0520  WBC 12.1* 8.9 9.0 8.5  NEUTROABS 10.4*  --   --  6.5  HGB 6.6* 5.8* 6.2* 5.9*  HCT 20.6* 18.3* 20.6* 19.3*  MCV 86.6 86.3 89.6 88.9  PLT 304 263 290 211   Basic Metabolic Panel:  Recent Labs Lab 01/16/17 1007 01/16/17 1300 01/16/17 1452 01/17/17 0400 01/18/17 0520  NA 127*  --  128* 132* 130*  K 5.9*  --  4.6 5.0 4.6  CL 88*  --  92* 93* 93*  CO2 26  --  26 28 28   GLUCOSE 340*  --  193* 230* 118*  BUN 36*  --  30* 14 24*  CREATININE 5.55*  --  4.40* 3.32* 5.31*  CALCIUM 8.8*  --  8.2* 8.3* 8.4*  PHOS  --  3.7 2.7  --   --    GFR: Estimated Creatinine Clearance: 10.9 mL/min (A) (by C-G formula based on SCr of 5.31 mg/dL  (H)). Liver Function Tests:  Recent Labs Lab 01/16/17 1007 01/16/17 1300 01/16/17 1452  AST 26 26  --   ALT 19 18  --   ALKPHOS 111 107  --   BILITOT 0.8 0.9  --   PROT 7.8 8.2*  --   ALBUMIN 2.6* 2.6* 2.3*   No results for input(s): LIPASE, AMYLASE in the last 168 hours. No results for input(s): AMMONIA in the last  168 hours. Coagulation Profile:  Recent Labs Lab 01/16/17 1007  INR 1.29   Cardiac Enzymes:  Recent Labs Lab 01/16/17 1007  CKTOTAL 158   BNP (last 3 results) No results for input(s): PROBNP in the last 8760 hours. HbA1C: No results for input(s): HGBA1C in the last 72 hours. CBG:  Recent Labs Lab 01/16/17 2115 01/17/17 0806 01/17/17 1237 01/17/17 1729 01/18/17 0738  GLUCAP 222* 263* 113* 138* 112*   Lipid Profile: No results for input(s): CHOL, HDL, LDLCALC, TRIG, CHOLHDL, LDLDIRECT in the last 72 hours. Thyroid Function Tests: No results for input(s): TSH, T4TOTAL, FREET4, T3FREE, THYROIDAB in the last 72 hours. Anemia Panel:  Recent Labs  01/16/17 1113  VITAMINB12 1,893*  FOLATE 34.0  FERRITIN 776*  TIBC 206*  IRON 36  RETICCTPCT 4.1*   Sepsis Labs:  Recent Labs Lab 01/16/17 1019  LATICACIDVEN 0.82    Recent Results (from the past 240 hour(s))  Culture, blood (Routine X 2) w Reflex to ID Panel     Status: None (Preliminary result)   Collection Time: 01/16/17 10:00 AM  Result Value Ref Range Status   Specimen Description BLOOD RIGHT ANTECUBITAL  Final   Special Requests   Final    BOTTLES DRAWN AEROBIC AND ANAEROBIC Blood Culture adequate volume   Culture NO GROWTH 2 DAYS  Final   Report Status PENDING  Incomplete  Culture, blood (Routine X 2) w Reflex to ID Panel     Status: None (Preliminary result)   Collection Time: 01/16/17 10:10 AM  Result Value Ref Range Status   Specimen Description BLOOD RIGHT HAND  Final   Special Requests   Final    BOTTLES DRAWN AEROBIC ONLY Blood Culture adequate volume   Culture NO GROWTH  2 DAYS  Final   Report Status PENDING  Incomplete       Radiology Studies: Mr Femur Left Wo Contrast  Result Date: 01/16/2017 CLINICAL DATA:  Left leg mass over the past 2 weeks. Partial amputation of the left foot. EXAM: MR OF THE LEFT FEMUR WITHOUT CONTRAST TECHNIQUE: Multiplanar, multisequence MR imaging of the left upper leg/femur was performed. No intravenous contrast was administered. COMPARISON:  01/01/2017 FINDINGS: Despite efforts by the technologist and patient, motion artifact is present on today's exam and could not be eliminated. This reduces exam sensitivity and specificity. Bones/Joint/Cartilage No significant abnormal osseous edema. Moderate left and trace right knee joint effusion. Ligaments N/A Muscles and Tendons Extensive abnormal edema in the anterior compartmental musculature of the upper leg, with new lower progressive heterogeneous but high T2 signal intensity fluid collection primarily tracking within along the vastus lateralis and vastus intermedius muscles, measuring 7.2 by 5.6 by 18.9 cm (volume = 400 cm^3). This could represent abscess or hematoma. There is some disruption of portions of the vastus lateralis, and edema signal is again identified in all anterior compartmental musculature. There is also some edema tracking within and around the pectineus and adductor longus muscles. Soft tissues Subcutaneous edema in the upper thighs, left greater than right. IMPRESSION: 1. Abnormal extensive edema in the anterior compartment of the left upper leg, with some mild involvement of the medial compartment. Compartment syndrome and infection not excluded. Compared to the prior exam there has been progressive enlargement of a fluid collection primarily involving the vastus lateralis and vastus intermedius, current volume 400 cubic cm, potentially abscess or hematoma. 2. Moderate left and trace right knee effusions. Electronically Signed   By: Van Clines M.D.   On: 01/16/2017  14:48      Scheduled Meds: . lidocaine (PF)      . amLODipine  10 mg Oral QHS  . atorvastatin  40 mg Oral QHS  . calcium acetate  1,334 mg Oral TID WC  . carvedilol  25 mg Oral BID WC  . cloNIDine  0.1 mg Oral Q8H  . darbepoetin (ARANESP) injection - DIALYSIS  200 mcg Intravenous Q Thu-HD  . doxercalciferol  1 mcg Intravenous Q T,Th,Sa-HD  . feeding supplement (NEPRO CARB STEADY)  237 mL Oral BID BM  . haloperidol  0.5 mg Oral QHS  . hydrocortisone   Rectal BID  . insulin aspart  0-15 Units Subcutaneous TID WC  . insulin aspart  0-5 Units Subcutaneous QHS  . insulin glargine  13 Units Subcutaneous Daily  . LORazepam  2 mg Intravenous Q T,Th,Sa-HD  . multivitamin  1 tablet Oral QHS  . [START ON 01/19/2017] pantoprazole  40 mg Intravenous Q12H  . sertraline  50 mg Oral Daily  . sodium chloride flush  3 mL Intravenous Q12H  . sodium chloride flush  3 mL Intravenous Q12H  . traZODone  25 mg Oral QHS   Continuous Infusions: . sodium chloride    . sodium chloride    . sodium chloride    . ceFEPime (MAXIPIME) IV    . ferric gluconate (FERRLECIT/NULECIT) IV Stopped (01/18/17 1141)  . pantoprozole (PROTONIX) infusion 8 mg/hr (01/17/17 0022)  . vancomycin Stopped (01/18/17 1352)     LOS: 2 days    Time spent: 30 minutes   [name] Phi, DO [facility] [email or web address] Password [facility] 01/18/2017, 2:08 PM

## 2017-01-18 NOTE — Progress Notes (Signed)
Patient just returned from procedure. MD notified regarding diet order. Orders placed. Will continue to monitor.

## 2017-01-19 LAB — BASIC METABOLIC PANEL
Anion gap: 11 (ref 5–15)
BUN: 18 mg/dL (ref 6–20)
CALCIUM: 8.5 mg/dL — AB (ref 8.9–10.3)
CO2: 26 mmol/L (ref 22–32)
Chloride: 94 mmol/L — ABNORMAL LOW (ref 101–111)
Creatinine, Ser: 4.23 mg/dL — ABNORMAL HIGH (ref 0.44–1.00)
GFR calc Af Amer: 13 mL/min — ABNORMAL LOW (ref >60)
GFR, EST NON AFRICAN AMERICAN: 11 mL/min — AB (ref >60)
GLUCOSE: 304 mg/dL — AB (ref 65–99)
Potassium: 3.4 mmol/L — ABNORMAL LOW (ref 3.5–5.1)
Sodium: 131 mmol/L — ABNORMAL LOW (ref 135–145)

## 2017-01-19 LAB — CBC WITH DIFFERENTIAL/PLATELET
Basophils Absolute: 0 10*3/uL (ref 0.0–0.1)
Basophils Relative: 0 %
EOS PCT: 1 %
Eosinophils Absolute: 0.1 10*3/uL (ref 0.0–0.7)
HCT: 22.6 % — ABNORMAL LOW (ref 36.0–46.0)
Hemoglobin: 6.7 g/dL — CL (ref 12.0–15.0)
LYMPHS ABS: 1 10*3/uL (ref 0.7–4.0)
LYMPHS PCT: 12 %
MCH: 27.5 pg (ref 26.0–34.0)
MCHC: 29.6 g/dL — ABNORMAL LOW (ref 30.0–36.0)
MCV: 92.6 fL (ref 78.0–100.0)
MONO ABS: 0.3 10*3/uL (ref 0.1–1.0)
MONOS PCT: 4 %
Neutro Abs: 6.9 10*3/uL (ref 1.7–7.7)
Neutrophils Relative %: 83 %
PLATELETS: 294 10*3/uL (ref 150–400)
RBC: 2.44 MIL/uL — ABNORMAL LOW (ref 3.87–5.11)
RDW: 20.4 % — AB (ref 11.5–15.5)
WBC: 8.3 10*3/uL (ref 4.0–10.5)

## 2017-01-19 LAB — GLUCOSE, CAPILLARY
GLUCOSE-CAPILLARY: 161 mg/dL — AB (ref 65–99)
GLUCOSE-CAPILLARY: 228 mg/dL — AB (ref 65–99)
GLUCOSE-CAPILLARY: 194 mg/dL — AB (ref 65–99)
Glucose-Capillary: 364 mg/dL — ABNORMAL HIGH (ref 65–99)

## 2017-01-19 MED ORDER — POTASSIUM CHLORIDE CRYS ER 20 MEQ PO TBCR
40.0000 meq | EXTENDED_RELEASE_TABLET | Freq: Once | ORAL | Status: AC
  Administered 2017-01-19: 40 meq via ORAL
  Filled 2017-01-19: qty 2

## 2017-01-19 MED ORDER — HYDROMORPHONE HCL 1 MG/ML IJ SOLN
1.0000 mg | INTRAMUSCULAR | Status: AC | PRN
  Administered 2017-01-19 – 2017-01-20 (×2): 1 mg via INTRAVENOUS
  Filled 2017-01-19 (×2): qty 1

## 2017-01-19 MED ORDER — ONDANSETRON HCL 4 MG/2ML IJ SOLN
4.0000 mg | Freq: Four times a day (QID) | INTRAMUSCULAR | Status: DC | PRN
  Administered 2017-01-19 – 2017-01-24 (×8): 4 mg via INTRAVENOUS
  Filled 2017-01-19 (×9): qty 2

## 2017-01-19 NOTE — Consult Note (Signed)
Reason for Consult:Possible abscess left thigh Referring Physician: [name] is an 51 y.o. female.  HPI: Patient has been in the hospital for several days.  Has aspiration of left thigh abscess/mass yesterday, but now concern about possible deepr process in left thigh.  Spiked fever to 102.5.    History of diabetes and has ray amputation of left foot.  Also in chronic renal failure.  Past Medical History:  Diagnosis Date  . Anemia   . Anxiety   . Bipolar disorder, unspecified ([place]) 02/06/2014  . Chronic combined systolic and diastolic CHF (congestive heart failure) (HCC)    EF 40% by echo and 48% by stress test  . Chronic pain syndrome   . Depression   . ESRD on hemodialysis ([place])   . GERD (gastroesophageal reflux disease)   . Glaucoma   . Headache    sinus headaches  . Hepatitis C   . High cholesterol   . History of hiatal hernia   . History of vitamin D deficiency 06/20/2015  . Hypertension   . MRSA infection ?2014   "on the back of my head; spread throughout my bloodstream"  . Neuropathy   . Refusal of blood transfusions as patient is Jehovah's Witness   . Schizoaffective disorder, unspecified condition 02/08/2014  . Seizure [facility]) dx'd 2014   "don't know what kind; last one was ~ 04/2014"  . Stroke [facility])    TIA 2009  . TIA (transient ischemic attack) 2010  . Type II diabetes mellitus ([place])    INSULIN DEPENDENT    Past Surgical History:  Procedure Laterality Date  . ABDOMINAL HYSTERECTOMY  2014  . ABDOMINAL HYSTERECTOMY  2010  . AMPUTATION Left 05/21/2013   Procedure: Left Midfoot AMPUTATION;  Surgeon: Newt Minion, MD;  Location: [place];  Service: Orthopedics;  Laterality: Left;  Left Midfoot Amputation  . AV FISTULA PLACEMENT Left 05/04/2015   Procedure: ARTERIOVENOUS (AV) GRAFT INSERTION;  Surgeon: Angelia Mould, MD;  Location: [place];  Service: Vascular;  Laterality: Left;  . COLONOSCOPY WITH PROPOFOL N/A 06/22/2013   Procedure: COLONOSCOPY WITH  PROPOFOL;  Surgeon: Jeryl Columbia, MD;  Location: [facility] ENDOSCOPY;  Service: Endoscopy;  Laterality: N/A;  . ESOPHAGOGASTRODUODENOSCOPY N/A 05/11/2015   Procedure: ESOPHAGOGASTRODUODENOSCOPY (EGD);  Surgeon: Laurence Spates, MD;  Location: [facility] ENDOSCOPY;  Service: Endoscopy;  Laterality: N/A;  . EYE SURGERY Bilateral 2010   Lasik  . FLEXIBLE SIGMOIDOSCOPY N/A 01/17/2017   Procedure: FLEXIBLE SIGMOIDOSCOPY w/ hemorrhoid banding possible;  Surgeon: Gatha Mayer, MD;  Location: [facility] ENDOSCOPY;  Service: Endoscopy;  Laterality: N/A;  . FOOT AMPUTATION Left    due diabetic neuropathy, could not feel ulcer on bottom of foot  . REFRACTIVE SURGERY Bilateral 2013  . TONSILLECTOMY  1970's  . TUBAL LIGATION  2000    Family History  Problem Relation Age of Onset  . Hypertension Mother   . Hypertension Father     Social History:  reports that she quit smoking about 21 months ago. Her smoking use included Cigarettes. She has a 20.00 pack-year smoking history. She has never used smokeless tobacco. She reports that she drinks about 1.8 oz of alcohol per week . She reports that she uses drugs, including "Crack" cocaine and Cocaine.  Allergies:  Allergies  Allergen Reactions  . Penicillins Hives and Other (See Comments)    Has patient had a PCN reaction causing immediate rash, facial/tongue/throat swelling, SOB or lightheadedness with hypotension: Yes Has patient had a PCN reaction  causing severe rash involving mucus membranes or skin necrosis: No Has patient had a PCN reaction that required hospitalization No Has patient had a PCN reaction occurring within the last 10 years: No If all of the above answers are "NO", then may proceed with Cephalosporin use. Pt tolerates cefepime and ceftriaxone  . Gabapentin Itching and Swelling  . Lyrica [Pregabalin] Swelling and Other (See Comments)    Facial and leg swelling  . Saphris [Asenapine] Swelling and Other (See Comments)    Facial swelling  . Tramadol Swelling  and Other (See Comments)    Leg swelling  . Other Other (See Comments)    NO BLOOD PRODUCTS. PT IS A JEHOVAH WITNESS    Medications: I have reviewed the patient's current medications.  Results for orders placed or performed during the hospital encounter of 01/16/17 (from the past 48 hour(s))  CBC with Differential/Platelet     Status: Abnormal   Collection Time: 01/18/17  5:20 AM  Result Value Ref Range   WBC 8.5 4.0 - 10.5 K/uL   RBC 2.17 (L) 3.87 - 5.11 MIL/uL   Hemoglobin 5.9 (LL) 12.0 - 15.0 g/dL    Comment: REPEATED TO VERIFY CRITICAL VALUE NOTED.  VALUE IS CONSISTENT WITH PREVIOUSLY REPORTED AND CALLED VALUE.    HCT 19.3 (L) 36.0 - 46.0 %   MCV 88.9 78.0 - 100.0 fL   MCH 27.2 26.0 - 34.0 pg   MCHC 30.6 30.0 - 36.0 g/dL   RDW 19.6 (H) 11.5 - 15.5 %   Platelets 261 150 - 400 K/uL   Neutrophils Relative % 76 %   Neutro Abs 6.5 1.7 - 7.7 K/uL   Lymphocytes Relative 15 %   Lymphs Abs 1.3 0.7 - 4.0 K/uL   Monocytes Relative 8 %   Monocytes Absolute 0.7 0.1 - 1.0 K/uL   Eosinophils Relative 1 %   Eosinophils Absolute 0.1 0.0 - 0.7 K/uL   Basophils Relative 1 %   Basophils Absolute 0.1 0.0 - 0.1 K/uL  Basic metabolic panel     Status: Abnormal   Collection Time: 01/18/17  5:20 AM  Result Value Ref Range   Sodium 130 (L) 135 - 145 mmol/L   Potassium 4.6 3.5 - 5.1 mmol/L   Chloride 93 (L) 101 - 111 mmol/L   CO2 28 22 - 32 mmol/L   Glucose, Bld 118 (H) 65 - 99 mg/dL   BUN 24 (H) 6 - 20 mg/dL   Creatinine, Ser 5.31 (H) 0.44 - 1.00 mg/dL    Comment: DELTA CHECK NOTED   Calcium 8.4 (L) 8.9 - 10.3 mg/dL   GFR calc non Af Amer 9 (L) >60 mL/min   GFR calc Af Amer 10 (L) >60 mL/min    Comment: (NOTE) The eGFR has been calculated using the CKD EPI equation. This calculation has not been validated in all clinical situations. eGFR's persistently <60 mL/min signify possible Chronic Kidney Disease.    Anion gap 9 5 - 15  Glucose, capillary     Status: Abnormal   Collection  Time: 01/18/17  7:38 AM  Result Value Ref Range   Glucose-Capillary 112 (H) 65 - 99 mg/dL  Culture, blood (routine x 2)     Status: None (Preliminary result)   Collection Time: 01/18/17  9:05 AM  Result Value Ref Range   Specimen Description BLOOD HEMODIALYSIS FISTULA    Special Requests      BOTTLES DRAWN AEROBIC AND ANAEROBIC Blood Culture results may not be optimal due to  an excessive volume of blood received in culture bottles   Culture NO GROWTH < 24 HOURS    Report Status PENDING   Culture, blood (routine x 2)     Status: None (Preliminary result)   Collection Time: 01/18/17  9:20 AM  Result Value Ref Range   Specimen Description BLOOD HEMODIALYSIS FISTULA    Special Requests      BOTTLES DRAWN AEROBIC AND ANAEROBIC Blood Culture results may not be optimal due to an excessive volume of blood received in culture bottles   Culture NO GROWTH < 24 HOURS    Report Status PENDING   Glucose, capillary     Status: Abnormal   Collection Time: 01/18/17  3:03 PM  Result Value Ref Range   Glucose-Capillary 138 (H) 65 - 99 mg/dL  Aerobic/Anaerobic Culture (surgical/deep wound)     Status: None (Preliminary result)   Collection Time: 01/18/17  4:03 PM  Result Value Ref Range   Specimen Description ABSCESS THIGH    Special Requests NONE    Gram Stain      MODERATE WBC PRESENT, PREDOMINANTLY PMN NO ORGANISMS SEEN    Culture NO GROWTH 1 DAY    Report Status PENDING   Glucose, capillary     Status: Abnormal   Collection Time: 01/18/17  6:14 PM  Result Value Ref Range   Glucose-Capillary 155 (H) 65 - 99 mg/dL  Glucose, capillary     Status: Abnormal   Collection Time: 01/18/17 10:31 PM  Result Value Ref Range   Glucose-Capillary 239 (H) 65 - 99 mg/dL  Glucose, capillary     Status: Abnormal   Collection Time: 01/19/17  7:43 AM  Result Value Ref Range   Glucose-Capillary 364 (H) 65 - 99 mg/dL   Comment 1 Notify RN    Comment 2 Document in Chart   CBC with Differential/Platelet      Status: Abnormal   Collection Time: 01/19/17  9:15 AM  Result Value Ref Range   WBC 8.3 4.0 - 10.5 K/uL   RBC 2.44 (L) 3.87 - 5.11 MIL/uL   Hemoglobin 6.7 (LL) 12.0 - 15.0 g/dL    Comment: REPEATED TO VERIFY CRITICAL VALUE NOTED.  VALUE IS CONSISTENT WITH PREVIOUSLY REPORTED AND CALLED VALUE. DELTA CHECK NOTED    HCT 22.6 (L) 36.0 - 46.0 %   MCV 92.6 78.0 - 100.0 fL   MCH 27.5 26.0 - 34.0 pg   MCHC 29.6 (L) 30.0 - 36.0 g/dL   RDW 14.2 (H) 60.7 - 23.0 %   Platelets 294 150 - 400 K/uL   Neutrophils Relative % 83 %   Neutro Abs 6.9 1.7 - 7.7 K/uL   Lymphocytes Relative 12 %   Lymphs Abs 1.0 0.7 - 4.0 K/uL   Monocytes Relative 4 %   Monocytes Absolute 0.3 0.1 - 1.0 K/uL   Eosinophils Relative 1 %   Eosinophils Absolute 0.1 0.0 - 0.7 K/uL   Basophils Relative 0 %   Basophils Absolute 0.0 0.0 - 0.1 K/uL  Basic metabolic panel     Status: Abnormal   Collection Time: 01/19/17  9:15 AM  Result Value Ref Range   Sodium 131 (L) 135 - 145 mmol/L   Potassium 3.4 (L) 3.5 - 5.1 mmol/L   Chloride 94 (L) 101 - 111 mmol/L   CO2 26 22 - 32 mmol/L   Glucose, Bld 304 (H) 65 - 99 mg/dL   BUN 18 6 - 20 mg/dL   Creatinine, Ser 0.72 (H) 0.44 -  1.00 mg/dL   Calcium 8.5 (L) 8.9 - 10.3 mg/dL   GFR calc non Af Amer 11 (L) >60 mL/min   GFR calc Af Amer 13 (L) >60 mL/min    Comment: (NOTE) The eGFR has been calculated using the CKD EPI equation. This calculation has not been validated in all clinical situations. eGFR's persistently <60 mL/min signify possible Chronic Kidney Disease.    Anion gap 11 5 - 15  Glucose, capillary     Status: Abnormal   Collection Time: 01/19/17  1:01 PM  Result Value Ref Range   Glucose-Capillary 161 (H) 65 - 99 mg/dL   Comment 1 Notify RN    Comment 2 Document in Chart   Glucose, capillary     Status: Abnormal   Collection Time: 01/19/17  4:28 PM  Result Value Ref Range   Glucose-Capillary 228 (H) 65 - 99 mg/dL   Comment 1 Notify RN    Comment 2 Document in  Chart     [place] Abscess Drain  Result Date: 01/18/2017 INDICATION: 51 year old female presents with left thigh myositis and possible abscess EXAM: ULTRASOUND GUIDED ABSCESS DRAINAGE MEDICATIONS: The patient is currently admitted to the hospital and receiving intravenous antibiotics. The antibiotics were administered within an appropriate time frame prior to the initiation of the procedure. ANESTHESIA/SEDATION: None COMPLICATIONS: None PROCEDURE: Informed written consent was obtained from the patient after a thorough discussion of the procedural risks, benefits and alternatives. All questions were addressed. Maximal Sterile Barrier Technique was utilized including caps, mask, sterile gowns, sterile gloves, sterile drape, hand hygiene and skin antiseptic. A timeout was performed prior to the initiation of the procedure. Patient positioned supine position on the ultrasound stretcher. Ultrasound images of the left thigh performed with images stored and sent to PACs. The patient was then prepped and draped in the usual sterile fashion. The skin and subcutaneous tissue were generously infiltrated 1% lidocaine for local anesthesia. Small stab incision was made with 11 blade scalpel. Using ultrasound guidance, a 10 French drain was advanced into anechoic fluid collection lateral to the knee in the distal thigh. Drain placed with trocar technique, an once the drain was in place approximately 30 cc of complex yellow fluid was aspirated. Catheter was removed at the completion. Sample was sent for culture. Ultrasound guidance was then used to in attempt to place a yueh needle/catheter in a more complex structure of the distal thigh. No fluid returned. Sterile dressings were placed. Patient tolerated the procedure well and remained hemodynamically stable throughout. No complications encountered and no significant blood loss. IMPRESSION: Status post ultrasound-guided aspiration of anechoic fluid at the distal thigh with return  of approximately 30 cc of complex yellow fluid. Attempted aspiration of more complex fluid of the distal thigh unsuccessful, potentially remains as phlegmon. If the fluid becomes more liquified re- drainage may be considered, or alternatively, incision and drainage. Signed, Dulcy Fanny. Earleen Newport, DO [facility] Electronically Signed   By: Corrie Mckusick D.O.   On: 01/18/2017 15:47    Review of Systems  Constitutional: Positive for fever.  Musculoskeletal: Positive for myalgias.   Blood pressure 139/61, pulse 81, temperature 98 F (36.7 C), temperature source Oral, resp. rate 18, height '5\' 4"'$  (1.626 m), weight 61.6 kg (135 lb 12.9 oz), SpO2 95 %. Physical Exam  Vitals reviewed. Constitutional:  Seems malnourished and not well taken care of.  HENT:  Head: Normocephalic and atraumatic.  Eyes: Conjunctivae and EOM are normal. Pupils are equal, round,  and reactive to light.  Cardiovascular: Normal rate.   Musculoskeletal:       Left upper leg: She exhibits tenderness and swelling.       Legs:   Assessment/Plan: I do not forsee this patient being a surgical candidate.  I did review the MRI and it does show some significant signal changes deep in the left thigh.  I would ask orthopedic surgery to see what they think, but this is not a straight forward abscess collectin.  Attempts to aspirate it yesterday were unseccessful  [name] 01/19/2017, 6:58 PM

## 2017-01-19 NOTE — Progress Notes (Signed)
PROGRESS NOTE    [name]  YQM:[id number] DOB: 11/13/1965 DOA: 01/16/2017 PCP: Patient, [name]     Brief Narrative:  [name] is a 51 y.o. female with medical history significant of ESRD, CHF, chronic pain, Type 2 diabetes mellitus on insulin, cellulitis of left leg, anemia, HTN and cocaine use.  Pt complains of severe pain in her left leg.  Pt reports she has been having pain for over 2 weeks.  Pt was scheduled for dialysis and decided to come here instead to get help for leg pain.  She was admitted for further evaluation and treatment for her left leg cellulitis. 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 She underwent flex sigmoidoscopy which showed external hemorrhoids. Nephrology was also consulted due to her ESRD requiring dialysis. Palliative care consulted for goals of care discussions.  Assessment & Plan:   Principal Problem:   Cellulitis of left leg Active Problems:   Anemia of chronic kidney failure   Chronic pain syndrome   ESRD (end stage renal disease) (HCC)   Diabetes mellitus with complication (HCC)   Upper GI bleed   External hemorrhoids   Diverticulosis of colon without hemorrhage   Goals of care, counseling/discussion   Palliative care encounter  Left leg cellulitis, with abscess vs hematoma  -MRI left femur showed fluid collection 400cc, underwent IR aspiration on 6/16, with return of only 30 mL of fluid aspirate. -Continue IV antibiotics, vanco, maxipime  -Consult general surgery for possible incision and drainage -Blood cultures NGTD  -Pain control, antiemetic for nausea   Lower GI bleed secondary to external hemorrhoids -Stop all aspirin, ibuprofen -S/p flex sig 6/15 found external hemorrhoids, source of bleed, not suitable to band, no sign of blood in colon  -No blood transfusions due to being Jehovah's Witness -Hemoglobin is stable  Blood loss anemia -As above, continue to trend  CBC  ESRD TThS -Nephrology for dialysis  Type 2 diabetes, with hyperglycemia -Lantus  -Sliding-scale insulin  Essential hypertension. -Continue Norvasc, Coreg, catapres   Hyperlipidemia -Continue Lipitor  Depression/anxiety -Continue Zoloft, Ativan  Left plantar stump wound -Consult wound nurse, dressing change recommendations   DVT prophylaxis: SCD Code Status: Full Family Communication: No family at bedside Disposition Plan: pending improvement, further work up    Consultants:   GI  Nephro  IR  Palliative care  General surgery   Procedures:   Flex sigmoidoscopy 6/15  IR drainage of left thigh hematoma vs abscess 6/16  Antimicrobials:  Anti-infectives    Start     Dose/Rate Route Frequency Ordered Stop   01/18/17 1800  ceFEPIme (MAXIPIME) 2 g in dextrose 5 % 50 mL IVPB     2 g 100 mL/hr over 30 Minutes Intravenous Every T-Th-Sa (1800) 01/16/17 1308     01/18/17 1200  vancomycin (VANCOCIN) IVPB 750 mg/150 ml premix  Status:  Discontinued     750 mg 150 mL/hr over 60 Minutes Intravenous Every T-Th-Sa (Hemodialysis) 01/16/17 1308 01/16/17 1509   01/16/17 1600  vancomycin (VANCOCIN) IVPB 750 mg/150 ml premix     750 mg 150 mL/hr over 60 Minutes Intravenous Every T-Th-Sa (Hemodialysis) 01/16/17 1509     01/16/17 1300  vancomycin (VANCOCIN) IVPB 1000 mg/200 mL premix  Status:  Discontinued     1,000 mg 200 mL/hr over 60 Minutes Intravenous  Once 01/16/17 1245 01/16/17 1246   01/16/17 1000  vancomycin (VANCOCIN) IVPB 1000 mg/200 mL premix  1,000 mg 200 mL/hr over 60 Minutes Intravenous  Once 01/16/17 0946 01/16/17 1221   01/16/17 0945  ceFEPIme (MAXIPIME) 2 g in dextrose 5 % 50 mL IVPB     2 g 100 mL/hr over 30 Minutes Intravenous  Once 01/16/17 0944 01/16/17 1120         Subjective: She had been very difficult with staff, not following directions. She has been refusing her bed alarm, had an episode of falling at the side of her bed without injury  yesterday. Today, she had refused to work with physical therapy and only sat up in bed, but not out of bed. She continues to complain of left thigh pain. Has some nausea, vomiting this morning.   Objective: Vitals:   01/18/17 2234 01/19/17 0542 01/19/17 0916 01/19/17 1314  BP: (!) 140/54 (!) 149/58 (!) 144/65 139/61  Pulse: 69 73 81   Resp: 17 18 18    Temp: 98.4 F (36.9 C) 97.5 F (36.4 C) 98 F (36.7 C)   TempSrc: Oral Oral Oral   SpO2: 94% 95% 95%   Weight: 61.6 kg (135 lb 12.9 oz)     Height:        Intake/Output Summary (Last 24 hours) at 01/19/17 1409 Last data filed at 01/19/17 1034  Gross per 24 hour  Intake             1926 ml  Output                0 ml  Net             1926 ml   Filed Weights   01/18/17 0853 01/18/17 1307 01/18/17 2234  Weight: 64.6 kg (142 lb 6.7 oz) 61.6 kg (135 lb 12.9 oz) 61.6 kg (135 lb 12.9 oz)    Examination:  General exam: Appears calm and comfortable  Respiratory system: Clear to auscultation. Respiratory effort normal. Cardiovascular system: S1 & S2 heard, RRR. No JVD, murmurs, rubs, gallops or clicks. No pedal edema. Gastrointestinal system: Abdomen is nondistended, soft and nontender. No organomegaly or masses felt. Normal bowel sounds heard. Central nervous system: Alert and oriented. No focal neurological deficits. Extremities: + Left thigh larger compared to right.  Skin: +Left thigh with enlargement medially, improved in appearance but still has a lot of pain, +left foot with plantar wound without drainage  Psychiatry: Judgement and insight appear normal. Mood & affect appropriate.   Data Reviewed: I have personally reviewed following labs and imaging studies  CBC:  Recent Labs Lab 01/16/17 1007 01/16/17 1452 01/17/17 0400 01/18/17 0520 01/19/17 0915  WBC 12.1* 8.9 9.0 8.5 8.3  NEUTROABS 10.4*  --   --  6.5 6.9  HGB 6.6* 5.8* 6.2* 5.9* 6.7*  HCT 20.6* 18.3* 20.6* 19.3* 22.6*  MCV 86.6 86.3 89.6 88.9 92.6  PLT 304 263  290 261 759   Basic Metabolic Panel:  Recent Labs Lab 01/16/17 1007 01/16/17 1300 01/16/17 1452 01/17/17 0400 01/18/17 0520 01/19/17 0915  NA 127*  --  128* 132* 130* 131*  K 5.9*  --  4.6 5.0 4.6 3.4*  CL 88*  --  92* 93* 93* 94*  CO2 26  --  26 28 28 26   GLUCOSE 340*  --  193* 230* 118* 304*  BUN 36*  --  30* 14 24* 18  CREATININE 5.55*  --  4.40* 3.32* 5.31* 4.23*  CALCIUM 8.8*  --  8.2* 8.3* 8.4* 8.5*  PHOS  --  3.7 2.7  --   --   --  GFR: Estimated Creatinine Clearance: 13.7 mL/min (A) (by C-G formula based on SCr of 4.23 mg/dL (H)). Liver Function Tests:  Recent Labs Lab 01/16/17 1007 01/16/17 1300 01/16/17 1452  AST 26 26  --   ALT 19 18  --   ALKPHOS 111 107  --   BILITOT 0.8 0.9  --   PROT 7.8 8.2*  --   ALBUMIN 2.6* 2.6* 2.3*   No results for input(s): LIPASE, AMYLASE in the last 168 hours. No results for input(s): AMMONIA in the last 168 hours. Coagulation Profile:  Recent Labs Lab 01/16/17 1007  INR 1.29   Cardiac Enzymes:  Recent Labs Lab 01/16/17 1007  CKTOTAL 158   BNP (last 3 results) No results for input(s): PROBNP in the last 8760 hours. HbA1C: No results for input(s): HGBA1C in the last 72 hours. CBG:  Recent Labs Lab 01/18/17 1503 01/18/17 1814 01/18/17 2231 01/19/17 0743 01/19/17 1301  GLUCAP 138* 155* 239* 364* 161*   Lipid Profile: No results for input(s): CHOL, HDL, LDLCALC, TRIG, CHOLHDL, LDLDIRECT in the last 72 hours. Thyroid Function Tests: No results for input(s): TSH, T4TOTAL, FREET4, T3FREE, THYROIDAB in the last 72 hours. Anemia Panel: No results for input(s): VITAMINB12, FOLATE, FERRITIN, TIBC, IRON, RETICCTPCT in the last 72 hours. Sepsis Labs:  Recent Labs Lab 01/16/17 1019  LATICACIDVEN 0.82    Recent Results (from the past 240 hour(s))  Culture, blood (Routine X 2) w Reflex to ID Panel     Status: None (Preliminary result)   Collection Time: 01/16/17 10:00 AM  Result Value Ref Range Status    Specimen Description BLOOD RIGHT ANTECUBITAL  Final   Special Requests   Final    BOTTLES DRAWN AEROBIC AND ANAEROBIC Blood Culture adequate volume   Culture NO GROWTH 3 DAYS  Final   Report Status PENDING  Incomplete  Culture, blood (Routine X 2) w Reflex to ID Panel     Status: None (Preliminary result)   Collection Time: 01/16/17 10:10 AM  Result Value Ref Range Status   Specimen Description BLOOD RIGHT HAND  Final   Special Requests   Final    BOTTLES DRAWN AEROBIC ONLY Blood Culture adequate volume   Culture NO GROWTH 3 DAYS  Final   Report Status PENDING  Incomplete  Culture, blood (routine x 2)     Status: None (Preliminary result)   Collection Time: 01/18/17  9:05 AM  Result Value Ref Range Status   Specimen Description BLOOD HEMODIALYSIS FISTULA  Final   Special Requests   Final    BOTTLES DRAWN AEROBIC AND ANAEROBIC Blood Culture results may not be optimal due to an excessive volume of blood received in culture bottles   Culture NO GROWTH < 24 HOURS  Final   Report Status PENDING  Incomplete  Culture, blood (routine x 2)     Status: None (Preliminary result)   Collection Time: 01/18/17  9:20 AM  Result Value Ref Range Status   Specimen Description BLOOD HEMODIALYSIS FISTULA  Final   Special Requests   Final    BOTTLES DRAWN AEROBIC AND ANAEROBIC Blood Culture results may not be optimal due to an excessive volume of blood received in culture bottles   Culture NO GROWTH < 24 HOURS  Final   Report Status PENDING  Incomplete  Aerobic/Anaerobic Culture (surgical/deep wound)     Status: None (Preliminary result)   Collection Time: 01/18/17  4:03 PM  Result Value Ref Range Status   Specimen Description ABSCESS THIGH  Final   Special Requests NONE  Final   Gram Stain   Final    MODERATE WBC PRESENT, PREDOMINANTLY PMN NO ORGANISMS SEEN    Culture NO GROWTH 1 DAY  Final   Report Status PENDING  Incomplete       Radiology Studies: [place] Abscess Drain  Result Date:  01/18/2017 INDICATION: 51 year old female presents with left thigh myositis and possible abscess EXAM: ULTRASOUND GUIDED ABSCESS DRAINAGE MEDICATIONS: The patient is currently admitted to the hospital and receiving intravenous antibiotics. The antibiotics were administered within an appropriate time frame prior to the initiation of the procedure. ANESTHESIA/SEDATION: None COMPLICATIONS: None PROCEDURE: Informed written consent was obtained from the patient after a thorough discussion of the procedural risks, benefits and alternatives. All questions were addressed. Maximal Sterile Barrier Technique was utilized including caps, mask, sterile gowns, sterile gloves, sterile drape, hand hygiene and skin antiseptic. A timeout was performed prior to the initiation of the procedure. Patient positioned supine position on the ultrasound stretcher. Ultrasound images of the left thigh performed with images stored and sent to PACs. The patient was then prepped and draped in the usual sterile fashion. The skin and subcutaneous tissue were generously infiltrated 1% lidocaine for local anesthesia. Small stab incision was made with 11 blade scalpel. Using ultrasound guidance, a 10 French drain was advanced into anechoic fluid collection lateral to the knee in the distal thigh. Drain placed with trocar technique, an once the drain was in place approximately 30 cc of complex yellow fluid was aspirated. Catheter was removed at the completion. Sample was sent for culture. Ultrasound guidance was then used to in attempt to place a yueh needle/catheter in a more complex structure of the distal thigh. No fluid returned. Sterile dressings were placed. Patient tolerated the procedure well and remained hemodynamically stable throughout. No complications encountered and no significant blood loss. IMPRESSION: Status post ultrasound-guided aspiration of anechoic fluid at the distal thigh with return of approximately 30 cc of complex yellow  fluid. Attempted aspiration of more complex fluid of the distal thigh unsuccessful, potentially remains as phlegmon. If the fluid becomes more liquified re- drainage may be considered, or alternatively, incision and drainage. Signed, Dulcy Fanny. Earleen Newport, DO [facility] Radiology Electronically Signed   By: Corrie Mckusick D.O.   On: 01/18/2017 15:47      Scheduled Meds: . amLODipine  10 mg Oral QHS  . atorvastatin  40 mg Oral QHS  . calcium acetate  1,334 mg Oral TID WC  . carvedilol  25 mg Oral BID WC  . cloNIDine  0.1 mg Oral Q8H  . darbepoetin (ARANESP) injection - DIALYSIS  200 mcg Intravenous Q Thu-HD  . doxercalciferol  1 mcg Intravenous Q T,Th,Sa-HD  . feeding supplement (NEPRO CARB STEADY)  237 mL Oral BID BM  . haloperidol  0.5 mg Oral QHS  . hydrocortisone   Rectal BID  . insulin aspart  0-15 Units Subcutaneous TID WC  . insulin aspart  0-5 Units Subcutaneous QHS  . insulin glargine  13 Units Subcutaneous Daily  . LORazepam  2 mg Intravenous Q T,Th,Sa-HD  . multivitamin  1 tablet Oral QHS  . pantoprazole  20 mg Oral Daily  . sertraline  50 mg Oral Daily  . sodium chloride flush  3 mL Intravenous Q12H  . sodium chloride flush  3 mL Intravenous Q12H  . traZODone  25 mg Oral QHS   Continuous Infusions: . sodium chloride    . sodium chloride    .  sodium chloride    . ceFEPime (MAXIPIME) IV Stopped (01/18/17 1918)  . ferric gluconate (FERRLECIT/NULECIT) IV Stopped (01/18/17 1141)  . vancomycin Stopped (01/18/17 1352)     LOS: 3 days    Time spent: 30 minutes   Dessa Phi, DO [facility] [email or web address] Password [email or web address] 01/19/2017, 2:09 PM

## 2017-01-19 NOTE — Progress Notes (Signed)
I have reviewed the recent studies and reports.  In spite of the fever spike to 102.5, there is little evidence that the leg fluid collection is an abscess.  Attempts to aspirate this under ultrasound guidance showed it was likely a phlegmon.  Cultures from the fluid drained yesterday are sterile so far.  I will come by later to examine the patient.    Kathryne Eriksson. [name], MD, [place] [phone] [facility] Surgery

## 2017-01-19 NOTE — Progress Notes (Signed)
[facility] Progress Note   Dialysis Orders:[facility] T,TH,S 3.5 hrs 63.5 kg 400/800  2.0 K/2.25 Ca UF profile 2 -Heparin: NONE -Mircera 200 mcg IV q 2 weeks (last dose 150 mcg IV 01/07/17) -Venofer 50 mg IV weekly (last dose 01/11/2017 Last Fe 36 Ferritin 876 Tsat 18% 12/26/16-rec'd venofer 100 mg IV X 8 doses) -Hectorol 2 mcg IV TIW (Last PTH 593 12/26/16)   BMD meds:  Calcium Acetate 667 mg 2 capsules TID AC  Renvela 800 mg 3 tabs PO TID AC  Assessment: 1. Fever/ left thigh abscess- CK 158, febrile- blood cultures drawn; on empiric Vanc and Maxepime, 300 cc drained by IR yest but still very tender. Have d/w primary MD, may need surgical drainage 2. ESRD - TTS HD, next HD Tuesday 3. Anemia JW/ Heme + by report - 17% sat , ferritin 776 - 6/14 - repleting- continue after d/c , continue max ESA- 200 Aranesp given 6/14 hgb 6.2 today - had been trending down at her outpt unit - on PPI; previously taking NSAIDS because not getting other pain meds - issue with chronic pain as well as acute pain from left thigh - GI following 4. Secondary hyperparathyroidism - on hectorol/current binders- only on phoslo at present P lower than usual  5. HTN/volume - tirtate to EDW /current meds- net UF 3 L Thursday 6. Nutrition -  Renal diet/vitsuppl- alb low- npo for procedure 7. DM - per primary 8. Psych - current meds   P - cont HD TTS   Kelly Splinter MD [place] pager [phone]   01/19/2017, 12:36 PM   Subjective:  Denies needle use with drugs - only smokes crack/weed.   Objective Vitals:   01/18/17 1800 01/18/17 2234 01/19/17 0542 01/19/17 0916  BP: (!) 137/56 (!) 140/54 (!) 149/58 (!) 144/65  Pulse: 69 69 73 81  Resp: 16 17 18 18   Temp: 98 F (36.7 C) 98.4 F (36.9 C) 97.5 F (36.4 C) 98 F (36.7 C)  TempSrc: Oral Oral Oral Oral  SpO2: 96% 94% 95% 95%  Weight:  61.6 kg (135 lb 12.9 oz)    Height:       Physical Exam General: NAD feels better  today Heart: RRR Lungs: no rales Abdomen: soft NT Extremities: no sig edema LE, left thigh - still sig swelling and pain per pt Dialysis Access: left lower AVGG + bruit   Additional Objective Labs: Basic Metabolic Panel:  Recent Labs Lab 01/16/17 1300 01/16/17 1452 01/17/17 0400 01/18/17 0520 01/19/17 0915  NA  --  128* 132* 130* 131*  K  --  4.6 5.0 4.6 3.4*  CL  --  92* 93* 93* 94*  CO2  --  26 28 28 26   GLUCOSE  --  193* 230* 118* 304*  BUN  --  30* 14 24* 18  CREATININE  --  4.40* 3.32* 5.31* 4.23*  CALCIUM  --  8.2* 8.3* 8.4* 8.5*  PHOS 3.7 2.7  --   --   --    Liver Function Tests:  Recent Labs Lab 01/16/17 1007 01/16/17 1300 01/16/17 1452  AST 26 26  --   ALT 19 18  --   ALKPHOS 111 107  --   BILITOT 0.8 0.9  --   PROT 7.8 8.2*  --   ALBUMIN 2.6* 2.6* 2.3*   No results for input(s): LIPASE, AMYLASE in the last 168 hours. CBC:  Recent Labs Lab 01/16/17 1007 01/16/17 1452 01/17/17 0400 01/18/17 0520  01/19/17 0915  WBC 12.1* 8.9 9.0 8.5 8.3  NEUTROABS 10.4*  --   --  6.5 6.9  HGB 6.6* 5.8* 6.2* 5.9* 6.7*  HCT 20.6* 18.3* 20.6* 19.3* 22.6*  MCV 86.6 86.3 89.6 88.9 92.6  PLT 304 263 290 261 294   Blood Culture    Component Value Date/Time   SDES ABSCESS THIGH 01/18/2017 1603   SPECREQUEST NONE 01/18/2017 1603   CULT NO GROWTH 1 DAY 01/18/2017 1603   REPTSTATUS PENDING 01/18/2017 1603    Cardiac Enzymes:  Recent Labs Lab 01/16/17 1007  CKTOTAL 158   CBG:  Recent Labs Lab 01/18/17 0738 01/18/17 1503 01/18/17 1814 01/18/17 2231 01/19/17 0743  GLUCAP 112* 138* 155* 239* 364*   Iron Studies: No results for input(s): IRON, TIBC, TRANSFERRIN, FERRITIN in the last 72 hours. Lab Results  Component Value Date   INR 1.29 01/16/2017   INR 1.35 07/21/2016   INR 1.4 (A) 12/23/2015   INR 1.2 (A) 12/23/2015   PROTIME 17.3 (A) 12/23/2015   PROTIME 15.9 (A) 12/23/2015   Studies/Results: [place] Abscess Drain  Result Date:  01/18/2017 INDICATION: 51 year old female presents with left thigh myositis and possible abscess EXAM: ULTRASOUND GUIDED ABSCESS DRAINAGE MEDICATIONS: The patient is currently admitted to the hospital and receiving intravenous antibiotics. The antibiotics were administered within an appropriate time frame prior to the initiation of the procedure. ANESTHESIA/SEDATION: None COMPLICATIONS: None PROCEDURE: Informed written consent was obtained from the patient after a thorough discussion of the procedural risks, benefits and alternatives. All questions were addressed. Maximal Sterile Barrier Technique was utilized including caps, mask, sterile gowns, sterile gloves, sterile drape, hand hygiene and skin antiseptic. A timeout was performed prior to the initiation of the procedure. Patient positioned supine position on the ultrasound stretcher. Ultrasound images of the left thigh performed with images stored and sent to PACs. The patient was then prepped and draped in the usual sterile fashion. The skin and subcutaneous tissue were generously infiltrated 1% lidocaine for local anesthesia. Small stab incision was made with 11 blade scalpel. Using ultrasound guidance, a 10 French drain was advanced into anechoic fluid collection lateral to the knee in the distal thigh. Drain placed with trocar technique, an once the drain was in place approximately 30 cc of complex yellow fluid was aspirated. Catheter was removed at the completion. Sample was sent for culture. Ultrasound guidance was then used to in attempt to place a yueh needle/catheter in a more complex structure of the distal thigh. No fluid returned. Sterile dressings were placed. Patient tolerated the procedure well and remained hemodynamically stable throughout. No complications encountered and no significant blood loss. IMPRESSION: Status post ultrasound-guided aspiration of anechoic fluid at the distal thigh with return of approximately 30 cc of complex yellow  fluid. Attempted aspiration of more complex fluid of the distal thigh unsuccessful, potentially remains as phlegmon. If the fluid becomes more liquified re- drainage may be considered, or alternatively, incision and drainage. Signed, Dulcy Fanny. Earleen Newport, DO [facility] Radiology Electronically Signed   By: Corrie Mckusick D.O.   On: 01/18/2017 15:47   Medications: . sodium chloride    . sodium chloride    . sodium chloride    . ceFEPime (MAXIPIME) IV Stopped (01/18/17 1918)  . ferric gluconate (FERRLECIT/NULECIT) IV Stopped (01/18/17 1141)  . vancomycin Stopped (01/18/17 1352)   . amLODipine  10 mg Oral QHS  . atorvastatin  40 mg Oral QHS  . calcium acetate  1,334 mg Oral  TID WC  . carvedilol  25 mg Oral BID WC  . cloNIDine  0.1 mg Oral Q8H  . darbepoetin (ARANESP) injection - DIALYSIS  200 mcg Intravenous Q Thu-HD  . doxercalciferol  1 mcg Intravenous Q T,Th,Sa-HD  . feeding supplement (NEPRO CARB STEADY)  237 mL Oral BID BM  . haloperidol  0.5 mg Oral QHS  . hydrocortisone   Rectal BID  . insulin aspart  0-15 Units Subcutaneous TID WC  . insulin aspart  0-5 Units Subcutaneous QHS  . insulin glargine  13 Units Subcutaneous Daily  . LORazepam  2 mg Intravenous Q T,Th,Sa-HD  . multivitamin  1 tablet Oral QHS  . pantoprazole  20 mg Oral Daily  . sertraline  50 mg Oral Daily  . sodium chloride flush  3 mL Intravenous Q12H  . sodium chloride flush  3 mL Intravenous Q12H  . traZODone  25 mg Oral QHS

## 2017-01-19 NOTE — Evaluation (Signed)
Physical Therapy Evaluation Patient Details Name: [name] MRN: [id number] DOB: June 27, 1966 Today's Date: 01/19/2017   History of Present Illness  Pt is a 51 yo female admitted through ED on 01/16/17 for L leg cellulitis. PMH significant for ESRD on HD, CHF, chronic P, DM2, cellulitis, anemia, HTN, cocaine use, L transmetatarsal amputation.   Clinical Impression  Pt presents with the above diagnosis and below deficits for therapy evaluation. Prior to admission, pt was living with her son who works 4 hours a day and is not available 24 hrs. Pt reports being ambulatory prior to admission. Pt had a fall in her hospital room yesterday with nursing in the room when she attempted to get up and fix her bed. Pt is laying in bed without her gown on and only agreed to sitting up EOB and refused any OOB mobility with this clinician. Pt will benefit from continued acute rehab services in order to fully assess mobility to make good recommendations at DC.     Follow Up Recommendations Home health PT    Equipment Recommendations  Other (comment) (unsure, to be determined based on complete eval)    Recommendations for Other Services       Precautions / Restrictions Precautions Precautions: Fall Restrictions Weight Bearing Restrictions: No      Mobility  Bed Mobility Overal bed mobility: Modified Independent             General bed mobility comments: able to get into and out of bed without physical assistance  Transfers                 General transfer comment: pt deferred transfers  Ambulation/Gait                Stairs            Wheelchair Mobility    Modified Rankin (Stroke Patients Only)       Balance Overall balance assessment: Needs assistance Sitting-balance support: No upper extremity supported;Feet supported Sitting balance-Leahy Scale: Normal                                       Pertinent Vitals/Pain Pain Assessment:  Faces Faces Pain Scale: Hurts little more Pain Location: left LE Pain Descriptors / Indicators: Grimacing;Guarding Pain Intervention(s): Monitored during session    Home Living Family/patient expects to be discharged to:: Private residence Living Arrangements: Children Available Help at Discharge: Family;Available PRN/intermittently Type of Home: House Home Access: Stairs to enter   Entrance Stairs-Number of Steps: 3   Home Equipment: [place] - 2 wheels;Bedside commode      Prior Function Level of Independence: Independent with assistive device(s)               Hand Dominance   Dominant Hand: Right    Extremity/Trunk Assessment   Upper Extremity Assessment Upper Extremity Assessment: Overall WFL for tasks assessed    Lower Extremity Assessment Lower Extremity Assessment: Generalized weakness    Cervical / Trunk Assessment Cervical / Trunk Assessment: Normal  Communication   Communication: No difficulties  Cognition Arousal/Alertness: Awake/alert Behavior During Therapy: Restless;WFL for tasks assessed/performed Overall Cognitive Status: No family/caregiver present to determine baseline cognitive functioning  General Comments      Exercises     Assessment/Plan    PT Assessment Patient needs continued PT services  PT Problem List Decreased activity tolerance;Decreased balance;Decreased mobility;Decreased knowledge of use of DME;Pain       PT Treatment Interventions DME instruction;Gait training;Stair training;Functional mobility training;Therapeutic activities;Therapeutic exercise;Balance training    PT Goals (Current goals can be found in the Care Plan section)  Acute Rehab PT Goals Patient Stated Goal: to feel better PT Goal Formulation: With patient Time For Goal Achievement: 01/26/17 Potential to Achieve Goals: Good    Frequency Min 3X/week   Barriers to discharge         Co-evaluation               AM-PAC PT "6 Clicks" Daily Activity  Outcome Measure Difficulty turning over in bed (including adjusting bedclothes, sheets and blankets)?: None Difficulty moving from lying on back to sitting on the side of the bed? : None Difficulty sitting down on and standing up from a chair with arms (e.g., wheelchair, bedside commode, etc,.)?: A Little Help needed moving to and from a bed to chair (including a wheelchair)?: A Little Help needed walking in hospital room?: A Little Help needed climbing 3-5 steps with a railing? : A Little 6 Click Score: 20    End of Session   Activity Tolerance: Patient tolerated treatment well Patient left: in bed;with call [place]/phone within reach;with bed alarm set Nurse Communication: Mobility status PT Visit Diagnosis: Unsteadiness on feet (R26.81);History of falling (Z91.81);Difficulty in walking, not elsewhere classified (R26.2)    Time: 2536-6440 PT Time Calculation (min) (ACUTE ONLY): 14 min   Charges:   PT Evaluation $PT Eval Moderate Complexity: 1 Procedure     PT G Codes:        Scheryl Marten PT, DPT  [phone]   Shanon Rosser 01/19/2017, 1:05 PM

## 2017-01-20 ENCOUNTER — Inpatient Hospital Stay (HOSPITAL_COMMUNITY): Payer: Medicare Other

## 2017-01-20 DIAGNOSIS — L03116 Cellulitis of left lower limb: Secondary | ICD-10-CM

## 2017-01-20 LAB — GLUCOSE, CAPILLARY
GLUCOSE-CAPILLARY: 232 mg/dL — AB (ref 65–99)
GLUCOSE-CAPILLARY: 221 mg/dL — AB (ref 65–99)
GLUCOSE-CAPILLARY: 105 mg/dL — AB (ref 65–99)
Glucose-Capillary: 131 mg/dL — ABNORMAL HIGH (ref 65–99)
Glucose-Capillary: 97 mg/dL (ref 65–99)

## 2017-01-20 LAB — BASIC METABOLIC PANEL
Anion gap: 12 (ref 5–15)
BUN: 26 mg/dL — AB (ref 6–20)
CALCIUM: 8.6 mg/dL — AB (ref 8.9–10.3)
CO2: 25 mmol/L (ref 22–32)
CREATININE: 5.4 mg/dL — AB (ref 0.44–1.00)
Chloride: 89 mmol/L — ABNORMAL LOW (ref 101–111)
GFR calc non Af Amer: 8 mL/min — ABNORMAL LOW (ref >60)
GFR, EST AFRICAN AMERICAN: 10 mL/min — AB (ref >60)
Glucose, Bld: 224 mg/dL — ABNORMAL HIGH (ref 65–99)
Potassium: 4.3 mmol/L (ref 3.5–5.1)
SODIUM: 126 mmol/L — AB (ref 135–145)

## 2017-01-20 LAB — CBC WITH DIFFERENTIAL/PLATELET
BASOS PCT: 0 %
Basophils Absolute: 0 10*3/uL (ref 0.0–0.1)
EOS ABS: 0.1 10*3/uL (ref 0.0–0.7)
EOS PCT: 1 %
HCT: 22.8 % — ABNORMAL LOW (ref 36.0–46.0)
Hemoglobin: 7 g/dL — ABNORMAL LOW (ref 12.0–15.0)
Lymphocytes Relative: 17 %
Lymphs Abs: 1.1 10*3/uL (ref 0.7–4.0)
MCH: 28.3 pg (ref 26.0–34.0)
MCHC: 30.7 g/dL (ref 30.0–36.0)
MCV: 92.3 fL (ref 78.0–100.0)
MONO ABS: 0.4 10*3/uL (ref 0.1–1.0)
MONOS PCT: 6 %
NEUTROS PCT: 76 %
Neutro Abs: 4.9 10*3/uL (ref 1.7–7.7)
Platelets: 287 10*3/uL (ref 150–400)
RBC: 2.47 MIL/uL — ABNORMAL LOW (ref 3.87–5.11)
RDW: 20.7 % — AB (ref 11.5–15.5)
WBC: 6.4 10*3/uL (ref 4.0–10.5)

## 2017-01-20 MED ORDER — PROMETHAZINE HCL 25 MG/ML IJ SOLN
12.5000 mg | Freq: Four times a day (QID) | INTRAMUSCULAR | Status: DC | PRN
  Administered 2017-01-22: 12.5 mg via INTRAVENOUS
  Filled 2017-01-20: qty 1

## 2017-01-20 MED ORDER — OXYCODONE HCL 5 MG PO TABS
5.0000 mg | ORAL_TABLET | ORAL | Status: DC | PRN
  Administered 2017-01-20 – 2017-01-22 (×3): 10 mg via ORAL
  Administered 2017-01-23: 5 mg via ORAL
  Administered 2017-01-23: 10 mg via ORAL
  Filled 2017-01-20 (×2): qty 2
  Filled 2017-01-20: qty 1
  Filled 2017-01-20 (×2): qty 2

## 2017-01-20 MED ORDER — CYCLOBENZAPRINE HCL 5 MG PO TABS
5.0000 mg | ORAL_TABLET | Freq: Three times a day (TID) | ORAL | Status: DC | PRN
  Administered 2017-01-20 – 2017-01-23 (×3): 5 mg via ORAL
  Filled 2017-01-20 (×3): qty 1

## 2017-01-20 MED ORDER — HYDROMORPHONE HCL 1 MG/ML IJ SOLN
0.2500 mg | Freq: Once | INTRAMUSCULAR | Status: AC
  Administered 2017-01-20: 0.25 mg via INTRAVENOUS
  Filled 2017-01-20: qty 0.5

## 2017-01-20 NOTE — Progress Notes (Signed)
Earlier in the night, the patient having multiple complaints.  Does not like the bed alarm.  States that it scares her and she is going to sue [place].  She ensures [place] that she will not get up but then she proceeds to try to get up while RN is in room.  She also continues to complain of severe pain to her left thigh.  She states the oxycodone 10 mg is not touching the pain.  Also, she vomited about 600 cc after received Zofran at 2030.  I called and made MD aware.  Received order for IV Dilaudid which was given at 2341 and 0604.  She slept through most of night.  This morning she complained that RN was not flushing the pain medicine through.  I ensured patient that is was.  She also complained the pain made her feel like committing suicide.  I asked her if she had any intentions of harming herself and she stated no.  Will continue to monitor patient.  Earleen Reaper RN-BC, [employer]

## 2017-01-20 NOTE — Progress Notes (Signed)
Patient ID: [name], female   DOB: [birth year], 51 y.o.   MRN: [id number]  [facility] Surgery Progress Note  3 Days Post-Op  Subjective: CC- left thigh pain Patient states that pain medication is not helping. She continues to have severe left thigh pain despite adding IV dilaudid last night. Pain is constant and severe. She feels as though left thigh swelling is getting larger. WBC is WNL, and patient afebrile over night.  Objective: Vital signs in last 24 hours: Temp:  [98 F (36.7 C)-98.6 F (37 C)] 98.6 F (37 C) (06/18 0601) Pulse Rate:  [69-81] 69 (06/18 0601) Resp:  [18] 18 (06/18 0601) BP: (139-152)/(54-65) 152/60 (06/18 0601) SpO2:  [95 %-100 %] 100 % (06/18 0601) Weight:  [136 lb 11 oz (62 kg)] 136 lb 11 oz (62 kg) (06/17 2054) Last BM Date: 01/17/17  Intake/Output from previous day: 06/17 0701 - 06/18 0700 In: 1083 [P.O.:1080; I.V.:3] Out: 0  Intake/Output this shift: No intake/output data recorded.  PE: Gen:  Alert, NAD, pleasant HEENT: EOM's intact, pupils equal  Card:  RRR, no M/G/R heard Pulm:  CTAB, no W/R/R, effort normal Abd: Soft, NT/ND, +BS, no HSM, no hernia LLE: tenderness and edema noted to distal left thigh, no overlying skin changes or warmth. Calf is soft and nontender. Psych: A&Ox3   Lab Results:   Recent Labs  01/19/17 0915 01/20/17 0614  WBC 8.3 6.4  HGB 6.7* 7.0*  HCT 22.6* 22.8*  PLT 294 287   BMET  Recent Labs  01/19/17 0915 01/20/17 0614  NA 131* 126*  K 3.4* 4.3  CL 94* 89*  CO2 26 25  GLUCOSE 304* 224*  BUN 18 26*  CREATININE 4.23* 5.40*  CALCIUM 8.5* 8.6*   PT/INR No results for input(s): LABPROT, INR in the last 72 hours. CMP     Component Value Date/Time   NA 126 (L) 01/20/2017 0614   NA 130 (A) 12/13/2015   K 4.3 01/20/2017 0614   CL 89 (L) 01/20/2017 0614   CO2 25 01/20/2017 0614   GLUCOSE 224 (H) 01/20/2017 0614   BUN 26 (H) 01/20/2017 0614   BUN 68 (A) 12/13/2015   CREATININE 5.40 (H)  01/20/2017 0614   CALCIUM 8.6 (L) 01/20/2017 0614   PROT 8.2 (H) 01/16/2017 1300   ALBUMIN 2.3 (L) 01/16/2017 1452   AST 26 01/16/2017 1300   ALT 18 01/16/2017 1300   ALKPHOS 107 01/16/2017 1300   BILITOT 0.9 01/16/2017 1300   GFRNONAA 8 (L) 01/20/2017 0614   GFRAA 10 (L) 01/20/2017 0614   Lipase     Component Value Date/Time   LIPASE 29 12/31/2016 2155       Studies/Results: [place] Abscess Drain  Result Date: 01/18/2017 INDICATION: 51 year old female presents with left thigh myositis and possible abscess EXAM: ULTRASOUND GUIDED ABSCESS DRAINAGE MEDICATIONS: The patient is currently admitted to the hospital and receiving intravenous antibiotics. The antibiotics were administered within an appropriate time frame prior to the initiation of the procedure. ANESTHESIA/SEDATION: None COMPLICATIONS: None PROCEDURE: Informed written consent was obtained from the patient after a thorough discussion of the procedural risks, benefits and alternatives. All questions were addressed. Maximal Sterile Barrier Technique was utilized including caps, mask, sterile gowns, sterile gloves, sterile drape, hand hygiene and skin antiseptic. A timeout was performed prior to the initiation of the procedure. Patient positioned supine position on the ultrasound stretcher. Ultrasound images of the left thigh performed with images stored and sent to PACs. The patient was then  prepped and draped in the usual sterile fashion. The skin and subcutaneous tissue were generously infiltrated 1% lidocaine for local anesthesia. Small stab incision was made with 11 blade scalpel. Using ultrasound guidance, a 10 French drain was advanced into anechoic fluid collection lateral to the knee in the distal thigh. Drain placed with trocar technique, an once the drain was in place approximately 30 cc of complex yellow fluid was aspirated. Catheter was removed at the completion. Sample was sent for culture. Ultrasound guidance was then used to in  attempt to place a yueh needle/catheter in a more complex structure of the distal thigh. No fluid returned. Sterile dressings were placed. Patient tolerated the procedure well and remained hemodynamically stable throughout. No complications encountered and no significant blood loss. IMPRESSION: Status post ultrasound-guided aspiration of anechoic fluid at the distal thigh with return of approximately 30 cc of complex yellow fluid. Attempted aspiration of more complex fluid of the distal thigh unsuccessful, potentially remains as phlegmon. If the fluid becomes more liquified re- drainage may be considered, or alternatively, incision and drainage. Signed, Dulcy Fanny. Earleen Newport, DO [facility] Radiology Electronically Signed   By: Corrie Mckusick D.O.   On: 01/18/2017 15:47    Anti-infectives: Anti-infectives    Start     Dose/Rate Route Frequency Ordered Stop   01/18/17 1800  ceFEPIme (MAXIPIME) 2 g in dextrose 5 % 50 mL IVPB     2 g 100 mL/hr over 30 Minutes Intravenous Every T-Th-Sa (1800) 01/16/17 1308     01/18/17 1200  vancomycin (VANCOCIN) IVPB 750 mg/150 ml premix  Status:  Discontinued     750 mg 150 mL/hr over 60 Minutes Intravenous Every T-Th-Sa (Hemodialysis) 01/16/17 1308 01/16/17 1509   01/16/17 1600  vancomycin (VANCOCIN) IVPB 750 mg/150 ml premix     750 mg 150 mL/hr over 60 Minutes Intravenous Every T-Th-Sa (Hemodialysis) 01/16/17 1509     01/16/17 1300  vancomycin (VANCOCIN) IVPB 1000 mg/200 mL premix  Status:  Discontinued     1,000 mg 200 mL/hr over 60 Minutes Intravenous  Once 01/16/17 1245 01/16/17 1246   01/16/17 1000  vancomycin (VANCOCIN) IVPB 1000 mg/200 mL premix     1,000 mg 200 mL/hr over 60 Minutes Intravenous  Once 01/16/17 0946 01/16/17 1221   01/16/17 0945  ceFEPIme (MAXIPIME) 2 g in dextrose 5 % 50 mL IVPB     2 g 100 mL/hr over 30 Minutes Intravenous  Once 01/16/17 0944 01/16/17 1120        Assessment/Plan ESRD DM HTN HLD Depression/anxiety Tobacco abuse H/o substance abuse  Left thigh abscess/mass - MRI 6/14 showed progressive enlargement of a fluid collection primarily involving the vastus lateralis and vastus intermedius, current volume 400 cubic cm, potentially abscess or Hematoma - u/s guided drainage 6/16 of fluid at distal thigh with NGTD, aspiration of more complex fluid in distal thigh unsuccessful - WBC WNL, afebrile over night  ID - vanco 6/14>>, cefepime 6/14>> VTE - SCDs FEN - NPO  Plan - Fluid collection is not a straight forward abscess.  Recommend ortho consult as patient has seen Dr. Sharol Given in the past for this. General surgery has no plans for surgery at this time. Will sign off, please call with concerns.   LOS: 4 days    Jerrye Beavers , [facility] Surgery 01/20/2017, 8:56 AM Pager: [phone] Consults: [phone] Mon-Fri 7:00 am-4:30 pm Sat-Sun 7:00 am-11:30 am

## 2017-01-20 NOTE — Progress Notes (Signed)
[facility] Progress Note   Subjective: "They gave me pain medicine but it's in the tubing. I keep asking for pain medicine and nothing is workingThe [employer], still C/O 10/10 pain in L thigh  Objective Vitals:   01/19/17 0916 01/19/17 1314 01/19/17 2054 01/20/17 0601  BP: (!) 144/65 139/61 (!) 146/54 (!) 152/60  Pulse: 81  71 69  Resp: 18  18 18   Temp: 98 F (36.7 C)  98 F (36.7 C) 98.6 F (37 C)  TempSrc: Oral  Oral Oral  SpO2: 95%  100% 100%  Weight:   62 kg (136 lb 11 oz)   Height:       Physical Exam General: anxious, in pain, otherwise stable.  Heart: S1,S2, RRR  Lungs: CTAB A/P Abdomen: Active BS  Extremities: Persistent swelling/warmth L mid thigh. Amputation L foot Dialysis Access: LFA AVG + bruit  Additional Objective Labs: Basic Metabolic Panel:  Recent Labs Lab 01/16/17 1300 01/16/17 1452  01/18/17 0520 01/19/17 0915 01/20/17 0614  NA  --  128*  < > 130* 131* 126*  K  --  4.6  < > 4.6 3.4* 4.3  CL  --  92*  < > 93* 94* 89*  CO2  --  26  < > 28 26 25   GLUCOSE  --  193*  < > 118* 304* 224*  BUN  --  30*  < > 24* 18 26*  CREATININE  --  4.40*  < > 5.31* 4.23* 5.40*  CALCIUM  --  8.2*  < > 8.4* 8.5* 8.6*  PHOS 3.7 2.7  --   --   --   --   < > = values in this interval not displayed. Liver Function Tests:  Recent Labs Lab 01/16/17 1007 01/16/17 1300 01/16/17 1452  AST 26 26  --   ALT 19 18  --   ALKPHOS 111 107  --   BILITOT 0.8 0.9  --   PROT 7.8 8.2*  --   ALBUMIN 2.6* 2.6* 2.3*   No results for input(s): LIPASE, AMYLASE in the last 168 hours. CBC:  Recent Labs Lab 01/16/17 1452 01/17/17 0400 01/18/17 0520 01/19/17 0915 01/20/17 0614  WBC 8.9 9.0 8.5 8.3 6.4  NEUTROABS  --   --  6.5 6.9 4.9  HGB 5.8* 6.2* 5.9* 6.7* 7.0*  HCT 18.3* 20.6* 19.3* 22.6* 22.8*  MCV 86.3 89.6 88.9 92.6 92.3  PLT 263 290 261 294 287   Blood Culture    Component Value Date/Time   SDES ABSCESS THIGH 01/18/2017 1603   SPECREQUEST  NONE 01/18/2017 1603   CULT NO GROWTH 2 DAYS 01/18/2017 1603   REPTSTATUS PENDING 01/18/2017 1603    Cardiac Enzymes:  Recent Labs Lab 01/16/17 1007  CKTOTAL 158   CBG:  Recent Labs Lab 01/19/17 0743 01/19/17 1301 01/19/17 1628 01/19/17 2053 01/20/17 0805  GLUCAP 364* 161* 228* 194* 232*   Iron Studies: No results for input(s): IRON, TIBC, TRANSFERRIN, FERRITIN in the last 72 hours. @lablastinr3 @ Studies/Results: [place] Abscess Drain  Result Date: 01/18/2017 INDICATION: 51 year old female presents with left thigh myositis and possible abscess EXAM: ULTRASOUND GUIDED ABSCESS DRAINAGE MEDICATIONS: The patient is currently admitted to the hospital and receiving intravenous antibiotics. The antibiotics were administered within an appropriate time frame prior to the initiation of the procedure. ANESTHESIA/SEDATION: None COMPLICATIONS: None PROCEDURE: Informed written consent was obtained from the patient after a thorough discussion of the procedural risks, benefits and alternatives. All questions were addressed. Maximal Sterile  Barrier Technique was utilized including caps, mask, sterile gowns, sterile gloves, sterile drape, hand hygiene and skin antiseptic. A timeout was performed prior to the initiation of the procedure. Patient positioned supine position on the ultrasound stretcher. Ultrasound images of the left thigh performed with images stored and sent to PACs. The patient was then prepped and draped in the usual sterile fashion. The skin and subcutaneous tissue were generously infiltrated 1% lidocaine for local anesthesia. Small stab incision was made with 11 blade scalpel. Using ultrasound guidance, a 10 French drain was advanced into anechoic fluid collection lateral to the knee in the distal thigh. Drain placed with trocar technique, an once the drain was in place approximately 30 cc of complex yellow fluid was aspirated. Catheter was removed at the completion. Sample was sent for  culture. Ultrasound guidance was then used to in attempt to place a yueh needle/catheter in a more complex structure of the distal thigh. No fluid returned. Sterile dressings were placed. Patient tolerated the procedure well and remained hemodynamically stable throughout. No complications encountered and no significant blood loss. IMPRESSION: Status post ultrasound-guided aspiration of anechoic fluid at the distal thigh with return of approximately 30 cc of complex yellow fluid. Attempted aspiration of more complex fluid of the distal thigh unsuccessful, potentially remains as phlegmon. If the fluid becomes more liquified re- drainage may be considered, or alternatively, incision and drainage. Signed, Dulcy Fanny. Earleen Newport, DO [facility] Radiology Electronically Signed   By: Corrie Mckusick D.O.   On: 01/18/2017 15:47   Medications: . sodium chloride    . sodium chloride    . sodium chloride    . ceFEPime (MAXIPIME) IV Stopped (01/18/17 1918)  . ferric gluconate (FERRLECIT/NULECIT) IV Stopped (01/18/17 1141)  . vancomycin Stopped (01/18/17 1352)   . amLODipine  10 mg Oral QHS  . atorvastatin  40 mg Oral QHS  . calcium acetate  1,334 mg Oral TID WC  . carvedilol  25 mg Oral BID WC  . cloNIDine  0.1 mg Oral Q8H  . darbepoetin (ARANESP) injection - DIALYSIS  200 mcg Intravenous Q Thu-HD  . doxercalciferol  1 mcg Intravenous Q T,Th,Sa-HD  . feeding supplement (NEPRO CARB STEADY)  237 mL Oral BID BM  . haloperidol  0.5 mg Oral QHS  . hydrocortisone   Rectal BID  . insulin aspart  0-15 Units Subcutaneous TID WC  . insulin aspart  0-5 Units Subcutaneous QHS  . insulin glargine  13 Units Subcutaneous Daily  . LORazepam  2 mg Intravenous Q T,Th,Sa-HD  . multivitamin  1 tablet Oral QHS  . pantoprazole  20 mg Oral Daily  . sertraline  50 mg Oral Daily  . sodium chloride flush  3 mL Intravenous Q12H  . sodium chloride flush  3 mL Intravenous Q12H  .  traZODone  25 mg Oral QHS   Dialysis Orders:[facility] T,TH,S 3.5 hrs 63.5 kg 400/800  2.0 K/2.25 Ca UF profile 2 -Heparin: NONE -Mircera 200 mcg IV q 2 weeks (last dose 150 mcg IV 01/07/17) -Venofer 50 mg IV weekly (last dose 01/11/2017 Last Fe 36 Ferritin 876 Tsat 18% 12/26/16-rec'd venofer 100 mg IV X 8 doses) -Hectorol 2 mcg IV TIW (Last PTH 593 12/26/16)   BMD meds:  Calcium Acetate 667 mg 2 capsules TID AC  Renvela 800 mg 3 tabs PO TID AC  Assessment: 1. Fever/ left thigh abscess- CK 158, febrile- blood cultures drawn-NG 24 hours; WBC 6.4 on empiric Vanc and Maxepime,  300 cc drained by IR yest but still very tender. Have d/w primary MD, may need surgical drainage 2. ESRD - TTS HD, next HD Tuesday. No heparin. Check renal function.  3. Anemia JW/ Heme + by report - 17% sat , ferritin 776 - 6/14 - repleting- continue after d/c , continue max ESA- 200 Aranesp given 6/14 hgb 6.2 today - had been trending down at her outpt unit - on PPI; previously taking NSAIDS because not getting other pain meds - issue with chronic pain as well as acute pain from left thigh - GI following. HGB 7.0 today.  4. Secondary hyperparathyroidism - on hectorol/current binders- only on phoslo at present P lower than usual. 5. HTN/volume - tirtate to EDW /current meds- Last HD 01/18/17 Pre wt 64.6 kg Net UF 3000 Post wt 61.6 kg. Under EDW-lower on DC. HD tomorrow if still in hospital. 2-2.5 liters.  6. Nutrition -  Renal diet/vitsuppl- alb low- npo for procedure 7. DM - per primary 8. Psych - current meds  Rita H. Brown NP-C 01/20/2017, 10:07 AM  [employer] [phone]

## 2017-01-20 NOTE — Progress Notes (Signed)
PT Cancellation Note  Patient Details Name: [name] MRN: [id number] DOB: 1966-06-13   Cancelled Treatment:    Reason Eval/Treat Not Completed: Patient declined, no reason specified   Roney Marion, PT  Acute Rehabilitation Services Pager [phone] Office [phone]    Colletta [place] 01/20/2017, 10:21 AM

## 2017-01-20 NOTE — Progress Notes (Signed)
Paged Dr. Verlon Au, pt has not had any further vomiting and no sx orders at this time. May she have a diet.

## 2017-01-20 NOTE — Progress Notes (Signed)
PT Cancellation Note  Patient Details Name: [name] MRN: [id number] DOB: 08-Dec-1965   Cancelled Treatment:     Pt is off the floor for tests at this time. This was second attempt to see patient today. Will attempt to see pt tomorrow.   Meda Coffee, SPT [phone]   Eliezer Lofts Makalya Nave 01/20/2017, 3:48 PM

## 2017-01-20 NOTE — Progress Notes (Signed)
Patient sitting on chair,attempted to place chair alarm but patient refused. States "I do don't want any alarm on","it makes me claustrophobic". Explained the reason why we put the chair alarm on but patient vehemently refused. Patient is alert and oriented x4. Will continue to monitor. Kinya Meine, Wonda Cheng, Therapist, sports

## 2017-01-20 NOTE — Progress Notes (Signed)
Inpatient Diabetes Program Recommendations  AACE/ADA: New Consensus Statement on Inpatient Glycemic Control (2015)  Target Ranges:  Prepandial:   less than 140 mg/dL      Peak postprandial:   less than 180 mg/dL (1-2 hours)      Critically ill patients:  140 - 180 mg/dL   Results for [name], [name] (MRN [id number]) as of 01/20/2017 13:56  Ref. Range 01/20/2017 08:05 01/20/2017 11:50  Glucose-Capillary Latest Ref Range: 65 - 99 mg/dL 232 (H) 221 (H)    Home DM Meds: Lantus 22 units QHS       Novolog 5 units TID  Current Insulin Orders: Lantus 13 units daily      Novolog Moderate Correction Scale/ SSI (0-15 units) TID AC + HS      MD- Please consider the following in-hospital insulin adjustments:  Increase Lantus to 22 units daily (home dose)     --Will follow patient during hospitalization--  Wyn Quaker RN, MSN, CDE Diabetes Coordinator Inpatient Glycemic Control Team Team Pager: [phone] (8a-5p)

## 2017-01-20 NOTE — Progress Notes (Signed)
PROGRESS NOTE    [name]  VVZ:[id number] DOB: [birth year] DOA: 01/16/2017 PCP: Patient, [name]  Outpatient Specialists:     Brief Narrative:  50 ? ESRD TTS [place] Witness-does not get blood transfusion Systolic/diastolic heart failure last EF 40% DM TY2 with complications of neuropathy Bipolar + schizoaffective Chronic hepatitis C Chronic pain syndrome complicated by  Polysubstance marijuana/cocaine/street drugs? 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 Documented medical noncompliance   Admitted 6/14 with painful swollen left leg and dark stool in the setting of taking Goody powders Eventually found to have a large abscess in the left leg-started on vancomycin and Maxipime and and Protonix on admission- Hemoglobin on explain 6   Assessment & Plan:   Principal Problem:   Cellulitis of left leg Active Problems:   Anemia of chronic kidney failure   Chronic pain syndrome   ESRD (end stage renal disease) ([place])   Diabetes mellitus with complication ([place])   Upper GI bleed   External hemorrhoids   Diverticulosis of colon without hemorrhage   Goals of care, counseling/discussion   Palliative care encounter   Left leg cellulitis, with abscess vs hematoma  -MRI 6/14 left femur showed fluid collection 400cc, underwent IR aspiration on 6/16, with return of only 30 mL of fluid aspirate.  Continue IV antibiotics, vanco, maxipime  Dr. Maylene Roes Consult general surgery-as abscess is complicated they are recommending discussion with her primary edition Dr. Sharol Given who has been consulted 6/18 Blood cultures NGTD  Pain control, antiemetic for nausea   Blood loss anemia, normocytic Lower GI bleed secondary to external hemorrhoids No blood transfusions due to being Jehovah's Witness Stop all aspirin, ibuprofen Hemoglobin was around 6 when admitted S/p flex sig 6/15 found external hemorrhoids, source of bleed, not suitable to band, no sign of blood  in colon  Hemoglobin being managed by nephrology with injections of Nulecit and Aranesp  ESRD TThS Hypovolemic hyponatremia Secondary hyperparathyroidism Nephrology for dialysis  Type 2 diabetes, with hyperglycemia Lantus 13 U daily Sliding scale insulin, sugars 220  Essential hypertension. -Continue Norvasc, Coreg, catapres  Not controlled so might augment meds but probably be elevated because of pain Hyperlipidemia -Continue Lipitor  Depression/anxiety Chronic pain with polysubstance abuse  Noncompliance with medical therapy  -Continue Zoloft, Ativan - escalated narcotics from every 6 when necessary 2 every 4 when necessary  Left plantar stump wound -Consult wound nurse, dressing change recommendations   SCDs Inpatient Awaiting orthopedic input  Not ready for discharge   I had an extensive conversation with the patient and her son and they clearly understand that Dr. Sharol Given will attempt to address their multiple concerns regarding operative management versus not.  Consultants:   General surgery  Nephrology  Orthopedics   Procedures:   Attempt at I&D of 400 cm abscess versus hematoma   Antimicrobials:   Cefepime  Vancomycin   Subjective:  Asian subjectively in great amounts of pain yet sided commonly at the bedside  Tangential and very concerned about being "let out of the hospital too early last time " Seems to have very fixed and concrete beliefs in her healthcare  Tells me that she has had a fall and feels that "bone in the back is broken "   Objective: Vitals:   01/19/17 0916 01/19/17 1314 01/19/17 2054 01/20/17 0601  BP: (!) 144/65 139/61 (!) 146/54 (!) 152/60  Pulse: 81  71 69  Resp: 18  18 18   Temp: 98 F (36.7 C)  98 F (  36.7 C) 98.6 F (37 C)  TempSrc: Oral  Oral Oral  SpO2: 95%  100% 100%  Weight:   62 kg (136 lb 11 oz)   Height:        Intake/Output Summary (Last 24 hours) at 01/20/17 0924 Last data filed at 01/20/17  0602  Gross per 24 hour  Intake              603 ml  Output                0 ml  Net              603 ml   Filed Weights   01/18/17 1307 01/18/17 2234 01/19/17 2054  Weight: 61.6 kg (135 lb 12.9 oz) 61.6 kg (135 lb 12.9 oz) 62 kg (136 lb 11 oz)    Examination:  Alert oriented EOMI NCAT Chest is clinically clear Left lower scapula has a spasm and 7 cm area of abrasion I do not note any point tenderness or step-off and ribs seem intact including floating ribs that I'm able to palpate Abdomen soft nontender nondistended with no rebound Patient has a left Lis-Fronk amputation with a large area in the left thigh of mobile mass-there is no slipping and there is no clear demarcation Fistula in the left upper arm is functioning fair    Data Reviewed: I have personally reviewed following labs and imaging studies  CBC:  Recent Labs Lab 01/16/17 1007 01/16/17 1452 01/17/17 0400 01/18/17 0520 01/19/17 0915 01/20/17 0614  WBC 12.1* 8.9 9.0 8.5 8.3 6.4  NEUTROABS 10.4*  --   --  6.5 6.9 4.9  HGB 6.6* 5.8* 6.2* 5.9* 6.7* 7.0*  HCT 20.6* 18.3* 20.6* 19.3* 22.6* 22.8*  MCV 86.6 86.3 89.6 88.9 92.6 92.3  PLT 304 263 290 261 294 725   Basic Metabolic Panel:  Recent Labs Lab 01/16/17 1300 01/16/17 1452 01/17/17 0400 01/18/17 0520 01/19/17 0915 01/20/17 0614  NA  --  128* 132* 130* 131* 126*  K  --  4.6 5.0 4.6 3.4* 4.3  CL  --  92* 93* 93* 94* 89*  CO2  --  26 28 28 26 25   GLUCOSE  --  193* 230* 118* 304* 224*  BUN  --  30* 14 24* 18 26*  CREATININE  --  4.40* 3.32* 5.31* 4.23* 5.40*  CALCIUM  --  8.2* 8.3* 8.4* 8.5* 8.6*  PHOS 3.7 2.7  --   --   --   --    GFR: Estimated Creatinine Clearance: 10.8 mL/min (A) (by C-G formula based on SCr of 5.4 mg/dL (H)). Liver Function Tests:  Recent Labs Lab 01/16/17 1007 01/16/17 1300 01/16/17 1452  AST 26 26  --   ALT 19 18  --   ALKPHOS 111 107  --   BILITOT 0.8 0.9  --   PROT 7.8 8.2*  --   ALBUMIN 2.6* 2.6* 2.3*   No  results for input(s): LIPASE, AMYLASE in the last 168 hours. No results for input(s): AMMONIA in the last 168 hours. Coagulation Profile:  Recent Labs Lab 01/16/17 1007  INR 1.29   Cardiac Enzymes:  Recent Labs Lab 01/16/17 1007  CKTOTAL 158   BNP (last 3 results) No results for input(s): PROBNP in the last 8760 hours. HbA1C: No results for input(s): HGBA1C in the last 72 hours. CBG:  Recent Labs Lab 01/19/17 0743 01/19/17 1301 01/19/17 1628 01/19/17 2053 01/20/17 0805  GLUCAP 364* 161* 228* 194*  232*   Lipid Profile: No results for input(s): CHOL, HDL, LDLCALC, TRIG, CHOLHDL, LDLDIRECT in the last 72 hours. Thyroid Function Tests: No results for input(s): TSH, T4TOTAL, FREET4, T3FREE, THYROIDAB in the last 72 hours. Anemia Panel: No results for input(s): VITAMINB12, FOLATE, FERRITIN, TIBC, IRON, RETICCTPCT in the last 72 hours. Urine analysis:    Component Value Date/Time   COLORURINE YELLOW 01/01/2017 0054   APPEARANCEUR HAZY (A) 01/01/2017 0054   LABSPEC 1.013 01/01/2017 0054   PHURINE 8.0 01/01/2017 0054   GLUCOSEU >=500 (A) 01/01/2017 0054   HGBUR SMALL (A) 01/01/2017 0054   BILIRUBINUR NEGATIVE 01/01/2017 0054   BILIRUBINUR neg 12/13/2014 1127   KETONESUR NEGATIVE 01/01/2017 0054   PROTEINUR >=300 (A) 01/01/2017 0054   UROBILINOGEN 0.2 05/01/2015 1252   NITRITE NEGATIVE 01/01/2017 0054   LEUKOCYTESUR NEGATIVE 01/01/2017 0054   Sepsis Labs: @LABRCNTIP (procalcitonin:4,lacticidven:4)  ) Recent Results (from the past 240 hour(s))  Culture, blood (Routine X 2) w Reflex to ID Panel     Status: None (Preliminary result)   Collection Time: 01/16/17 10:00 AM  Result Value Ref Range Status   Specimen Description BLOOD RIGHT ANTECUBITAL  Final   Special Requests   Final    BOTTLES DRAWN AEROBIC AND ANAEROBIC Blood Culture adequate volume   Culture NO GROWTH 3 DAYS  Final   Report Status PENDING  Incomplete  Culture, blood (Routine X 2) w Reflex to ID  Panel     Status: None (Preliminary result)   Collection Time: 01/16/17 10:10 AM  Result Value Ref Range Status   Specimen Description BLOOD RIGHT HAND  Final   Special Requests   Final    BOTTLES DRAWN AEROBIC ONLY Blood Culture adequate volume   Culture NO GROWTH 3 DAYS  Final   Report Status PENDING  Incomplete  Culture, blood (routine x 2)     Status: None (Preliminary result)   Collection Time: 01/18/17  9:05 AM  Result Value Ref Range Status   Specimen Description BLOOD HEMODIALYSIS FISTULA  Final   Special Requests   Final    BOTTLES DRAWN AEROBIC AND ANAEROBIC Blood Culture results may not be optimal due to an excessive volume of blood received in culture bottles   Culture NO GROWTH < 24 HOURS  Final   Report Status PENDING  Incomplete  Culture, blood (routine x 2)     Status: None (Preliminary result)   Collection Time: 01/18/17  9:20 AM  Result Value Ref Range Status   Specimen Description BLOOD HEMODIALYSIS FISTULA  Final   Special Requests   Final    BOTTLES DRAWN AEROBIC AND ANAEROBIC Blood Culture results may not be optimal due to an excessive volume of blood received in culture bottles   Culture NO GROWTH < 24 HOURS  Final   Report Status PENDING  Incomplete  Aerobic/Anaerobic Culture (surgical/deep wound)     Status: None (Preliminary result)   Collection Time: 01/18/17  4:03 PM  Result Value Ref Range Status   Specimen Description ABSCESS THIGH  Final   Special Requests NONE  Final   Gram Stain   Final    MODERATE WBC PRESENT, PREDOMINANTLY PMN NO ORGANISMS SEEN    Culture NO GROWTH 2 DAYS  Final   Report Status PENDING  Incomplete         Radiology Studies: [place] Abscess Drain  Result Date: 01/18/2017 INDICATION: 51 year old female presents with left thigh myositis and possible abscess EXAM: ULTRASOUND GUIDED ABSCESS DRAINAGE MEDICATIONS: The patient is currently admitted  to the hospital and receiving intravenous antibiotics. The antibiotics were  administered within an appropriate time frame prior to the initiation of the procedure. ANESTHESIA/SEDATION: None COMPLICATIONS: None PROCEDURE: Informed written consent was obtained from the patient after a thorough discussion of the procedural risks, benefits and alternatives. All questions were addressed. Maximal Sterile Barrier Technique was utilized including caps, mask, sterile gowns, sterile gloves, sterile drape, hand hygiene and skin antiseptic. A timeout was performed prior to the initiation of the procedure. Patient positioned supine position on the ultrasound stretcher. Ultrasound images of the left thigh performed with images stored and sent to PACs. The patient was then prepped and draped in the usual sterile fashion. The skin and subcutaneous tissue were generously infiltrated 1% lidocaine for local anesthesia. Small stab incision was made with 11 blade scalpel. Using ultrasound guidance, a 10 French drain was advanced into anechoic fluid collection lateral to the knee in the distal thigh. Drain placed with trocar technique, an once the drain was in place approximately 30 cc of complex yellow fluid was aspirated. Catheter was removed at the completion. Sample was sent for culture. Ultrasound guidance was then used to in attempt to place a yueh needle/catheter in a more complex structure of the distal thigh. No fluid returned. Sterile dressings were placed. Patient tolerated the procedure well and remained hemodynamically stable throughout. No complications encountered and no significant blood loss. IMPRESSION: Status post ultrasound-guided aspiration of anechoic fluid at the distal thigh with return of approximately 30 cc of complex yellow fluid. Attempted aspiration of more complex fluid of the distal thigh unsuccessful, potentially remains as phlegmon. If the fluid becomes more liquified re- drainage may be considered, or alternatively, incision and drainage. Signed, Dulcy Fanny. Earleen Newport, DO [facility] Electronically Signed   By: Corrie Mckusick D.O.   On: 01/18/2017 15:47        Scheduled Meds: . amLODipine  10 mg Oral QHS  . atorvastatin  40 mg Oral QHS  . calcium acetate  1,334 mg Oral TID WC  . carvedilol  25 mg Oral BID WC  . cloNIDine  0.1 mg Oral Q8H  . darbepoetin (ARANESP) injection - DIALYSIS  200 mcg Intravenous Q Thu-HD  . doxercalciferol  1 mcg Intravenous Q T,Th,Sa-HD  . feeding supplement (NEPRO CARB STEADY)  237 mL Oral BID BM  . haloperidol  0.5 mg Oral QHS  . hydrocortisone   Rectal BID  . insulin aspart  0-15 Units Subcutaneous TID WC  . insulin aspart  0-5 Units Subcutaneous QHS  . insulin glargine  13 Units Subcutaneous Daily  . LORazepam  2 mg Intravenous Q T,Th,Sa-HD  . multivitamin  1 tablet Oral QHS  . pantoprazole  20 mg Oral Daily  . sertraline  50 mg Oral Daily  . sodium chloride flush  3 mL Intravenous Q12H  . sodium chloride flush  3 mL Intravenous Q12H  . traZODone  25 mg Oral QHS   Continuous Infusions: . sodium chloride    . sodium chloride    . sodium chloride    . ceFEPime (MAXIPIME) IV Stopped (01/18/17 1918)  . ferric gluconate (FERRLECIT/NULECIT) IV Stopped (01/18/17 1141)  . vancomycin Stopped (01/18/17 1352)     LOS: 4 days    Time spent: [place], MD [facility]   If 7PM-7AM, please contact night-coverage [email or web address] Password [facility] 01/20/2017, 9:24 AM

## 2017-01-21 DIAGNOSIS — L02416 Cutaneous abscess of left lower limb: Principal | ICD-10-CM

## 2017-01-21 LAB — RENAL FUNCTION PANEL
Albumin: 2.3 g/dL — ABNORMAL LOW (ref 3.5–5.0)
Anion gap: 11 (ref 5–15)
BUN: 35 mg/dL — ABNORMAL HIGH (ref 6–20)
CO2: 27 mmol/L (ref 22–32)
Calcium: 8.4 mg/dL — ABNORMAL LOW (ref 8.9–10.3)
Chloride: 87 mmol/L — ABNORMAL LOW (ref 101–111)
Creatinine, Ser: 6.75 mg/dL — ABNORMAL HIGH (ref 0.44–1.00)
GFR calc Af Amer: 7 mL/min — ABNORMAL LOW (ref >60)
GFR calc non Af Amer: 6 mL/min — ABNORMAL LOW (ref >60)
Glucose, Bld: 110 mg/dL — ABNORMAL HIGH (ref 65–99)
Phosphorus: 4.6 mg/dL (ref 2.5–4.6)
Potassium: 4.1 mmol/L (ref 3.5–5.1)
Sodium: 125 mmol/L — ABNORMAL LOW (ref 135–145)

## 2017-01-21 LAB — CBC
HCT: 21.8 % — ABNORMAL LOW (ref 36.0–46.0)
Hemoglobin: 6.8 g/dL — CL (ref 12.0–15.0)
MCH: 27.6 pg (ref 26.0–34.0)
MCHC: 31.2 g/dL (ref 30.0–36.0)
MCV: 88.6 fL (ref 78.0–100.0)
Platelets: 274 10*3/uL (ref 150–400)
RBC: 2.46 MIL/uL — ABNORMAL LOW (ref 3.87–5.11)
RDW: 20.8 % — ABNORMAL HIGH (ref 11.5–15.5)
WBC: 6.8 10*3/uL (ref 4.0–10.5)

## 2017-01-21 LAB — BASIC METABOLIC PANEL
Anion gap: 12 (ref 5–15)
BUN: 34 mg/dL — AB (ref 6–20)
CALCIUM: 8.5 mg/dL — AB (ref 8.9–10.3)
CO2: 27 mmol/L (ref 22–32)
CREATININE: 6.63 mg/dL — AB (ref 0.44–1.00)
Chloride: 87 mmol/L — ABNORMAL LOW (ref 101–111)
GFR calc Af Amer: 8 mL/min — ABNORMAL LOW (ref >60)
GFR calc non Af Amer: 7 mL/min — ABNORMAL LOW (ref >60)
Glucose, Bld: 103 mg/dL — ABNORMAL HIGH (ref 65–99)
POTASSIUM: 4.1 mmol/L (ref 3.5–5.1)
SODIUM: 126 mmol/L — AB (ref 135–145)

## 2017-01-21 LAB — CULTURE, BLOOD (ROUTINE X 2)
Culture: NO GROWTH
Culture: NO GROWTH
SPECIAL REQUESTS: ADEQUATE
Special Requests: ADEQUATE

## 2017-01-21 LAB — CBC WITH DIFFERENTIAL/PLATELET
BASOS ABS: 0 10*3/uL (ref 0.0–0.1)
Basophils Relative: 0 %
EOS ABS: 0.2 10*3/uL (ref 0.0–0.7)
EOS PCT: 3 %
HCT: 22.6 % — ABNORMAL LOW (ref 36.0–46.0)
Hemoglobin: 7.1 g/dL — ABNORMAL LOW (ref 12.0–15.0)
Lymphocytes Relative: 15 %
Lymphs Abs: 1 10*3/uL (ref 0.7–4.0)
MCH: 28.2 pg (ref 26.0–34.0)
MCHC: 31.4 g/dL (ref 30.0–36.0)
MCV: 89.7 fL (ref 78.0–100.0)
Monocytes Absolute: 0.8 10*3/uL (ref 0.1–1.0)
Monocytes Relative: 12 %
Neutro Abs: 4.6 10*3/uL (ref 1.7–7.7)
Neutrophils Relative %: 70 %
PLATELETS: 282 10*3/uL (ref 150–400)
RBC: 2.52 MIL/uL — AB (ref 3.87–5.11)
RDW: 21.2 % — ABNORMAL HIGH (ref 11.5–15.5)
WBC: 6.3 10*3/uL (ref 4.0–10.5)

## 2017-01-21 LAB — GLUCOSE, CAPILLARY
GLUCOSE-CAPILLARY: 136 mg/dL — AB (ref 65–99)
Glucose-Capillary: 124 mg/dL — ABNORMAL HIGH (ref 65–99)
Glucose-Capillary: 156 mg/dL — ABNORMAL HIGH (ref 65–99)

## 2017-01-21 LAB — URIC ACID: URIC ACID, SERUM: 7.3 mg/dL — AB (ref 2.3–6.6)

## 2017-01-21 MED ORDER — COLCHICINE 0.6 MG PO TABS: 0.6000 mg | ORAL_TABLET | Freq: Two times a day (BID) | ORAL | Status: DC

## 2017-01-21 MED ORDER — COLCHICINE 0.6 MG PO TABS
0.6000 mg | ORAL_TABLET | Freq: Once | ORAL | Status: AC
  Administered 2017-01-21: 0.6 mg via ORAL
  Filled 2017-01-21: qty 1

## 2017-01-21 MED ORDER — SODIUM CHLORIDE 0.9 % IV SOLN: 100.0000 mL | INTRAVENOUS | Status: DC | PRN

## 2017-01-21 MED ORDER — DOXERCALCIFEROL 4 MCG/2ML IV SOLN
INTRAVENOUS | Status: AC
  Filled 2017-01-21: qty 2

## 2017-01-21 MED ORDER — ALLOPURINOL 100 MG PO TABS
100.0000 mg | ORAL_TABLET | ORAL | Status: DC
  Administered 2017-01-23: 100 mg via ORAL
  Filled 2017-01-21: qty 1

## 2017-01-21 MED ORDER — VANCOMYCIN HCL IN DEXTROSE 750-5 MG/150ML-% IV SOLN
INTRAVENOUS | Status: AC
  Administered 2017-01-21: 750 mg via INTRAVENOUS
  Filled 2017-01-21: qty 150

## 2017-01-21 NOTE — Consult Note (Signed)
ORTHOPAEDIC CONSULTATION  REQUESTING PHYSICIAN: Nita Sells, MD  Chief Complaint: Painful swelling lateral left thigh  HPI: [name] is a 51 y.o. female who presents with painful swelling lateral left thigh. Patient denies any specific injury. Denies blunt trauma. Patient states that she has relapsed in her drug use due to the pain in the left thigh. Patient states she wants to sign herself up for hospice due to pain.  Past Medical History:  Diagnosis Date  . Anemia   . Anxiety   . Bipolar disorder, unspecified ([place]) 02/06/2014  . Chronic combined systolic and diastolic CHF (congestive heart failure) ([facility])    EF 40% by echo and 48% by stress test  . Chronic pain syndrome   . Depression   . ESRD on hemodialysis ([place])   . GERD (gastroesophageal reflux disease)   . Glaucoma   . Headache    sinus headaches  . Hepatitis C   . High cholesterol   . History of hiatal hernia   . History of vitamin D deficiency 06/20/2015  . Hypertension   . MRSA infection ?2014   "on the back of my head; spread throughout my bloodstream"  . Neuropathy   . Refusal of blood transfusions as patient is Jehovah's Witness   . Schizoaffective disorder, unspecified condition 02/08/2014  . Seizure [facility]) dx'd 2014   "don't know what kind; last one was ~ 04/2014"  . Stroke [facility])    TIA 2009  . TIA (transient ischemic attack) 2010  . Type II diabetes mellitus ([place])    INSULIN DEPENDENT   Past Surgical History:  Procedure Laterality Date  . ABDOMINAL HYSTERECTOMY  2014  . ABDOMINAL HYSTERECTOMY  2010  . AMPUTATION Left 05/21/2013   Procedure: Left Midfoot AMPUTATION;  Surgeon: Newt Minion, MD;  Location: [place];  Service: Orthopedics;  Laterality: Left;  Left Midfoot Amputation  . AV FISTULA PLACEMENT Left 05/04/2015   Procedure: ARTERIOVENOUS (AV) GRAFT INSERTION;  Surgeon: Angelia Mould, MD;  Location: [place];  Service: Vascular;  Laterality: Left;  . COLONOSCOPY WITH PROPOFOL N/A  06/22/2013   Procedure: COLONOSCOPY WITH PROPOFOL;  Surgeon: Jeryl Columbia, MD;  Location: [facility] ENDOSCOPY;  Service: Endoscopy;  Laterality: N/A;  . ESOPHAGOGASTRODUODENOSCOPY N/A 05/11/2015   Procedure: ESOPHAGOGASTRODUODENOSCOPY (EGD);  Surgeon: Laurence Spates, MD;  Location: [facility] ENDOSCOPY;  Service: Endoscopy;  Laterality: N/A;  . EYE SURGERY Bilateral 2010   Lasik  . FLEXIBLE SIGMOIDOSCOPY N/A 01/17/2017   Procedure: FLEXIBLE SIGMOIDOSCOPY w/ hemorrhoid banding possible;  Surgeon: Gatha Mayer, MD;  Location[facility];  Service: Endoscopy;  Laterality: N/A;  . FOOT AMPUTATION Left    due diabetic neuropathy, could not feel ulcer on bottom of foot  . REFRACTIVE SURGERY Bilateral 2013  . TONSILLECTOMY  1970's  . TUBAL LIGATION  2000   Social History   Social History  . Marital status: Single    Spouse name: N/A  . Number of children: N/A  . Years of education: N/A   Social History Main Topics  . Smoking status: Former Smoker    Packs/day: 1.00    Years: 20.00    Types: Cigarettes    Quit date: 03/31/2015  . Smokeless tobacco: Never Used     Comment: "stopped smoking in ~ 2013; restarted last week; stopped again 06/17/2014"  . Alcohol use 1.8 oz/week    3 Standard drinks or equivalent per week     Comment: 05/01/15 patient denies alcohol use.  . Drug use:  Yes    Types: "Crack" cocaine, Cocaine     Comment: last smoked crack 08/21/16  . Sexual activity: Not Currently    Birth control/ protection: Abstinence   Other Topics Concern  . None   Social History Narrative   ** Merged History Encounter **       Family History  Problem Relation Age of Onset  . Hypertension Mother   . Hypertension Father    - negative except otherwise stated in the family history section Allergies  Allergen Reactions  . Penicillins Hives and Other (See Comments)    Has patient had a PCN reaction causing immediate rash, facial/tongue/throat swelling, SOB or lightheadedness with hypotension:  Yes Has patient had a PCN reaction causing severe rash involving mucus membranes or skin necrosis: No Has patient had a PCN reaction that required hospitalization No Has patient had a PCN reaction occurring within the last 10 years: No If all of the above answers are "NO", then may proceed with Cephalosporin use. Pt tolerates cefepime and ceftriaxone  . Gabapentin Itching and Swelling  . Lyrica [Pregabalin] Swelling and Other (See Comments)    Facial and leg swelling  . Saphris [Asenapine] Swelling and Other (See Comments)    Facial swelling  . Tramadol Swelling and Other (See Comments)    Leg swelling  . Other Other (See Comments)    NO BLOOD PRODUCTS. PT IS A JEHOVAH WITNESS   Prior to Admission medications   Medication Sig Start Date End Date Taking? Authorizing Provider  albuterol (PROVENTIL HFA;VENTOLIN HFA) 108 (90 Base) MCG/ACT inhaler Inhale 2 puffs into the lungs every 4 (four) hours as needed for wheezing or shortness of breath. 10/15/16  Yes Horton, Barbette Hair, MD  amLODipine (NORVASC) 10 MG tablet Take 1 tablet (10 mg total) by mouth at bedtime. 09/02/16  Yes Hildred Priest, MD  atorvastatin (LIPITOR) 40 MG tablet Take 1 tablet (40 mg total) by mouth at bedtime. 09/02/16  Yes Hildred Priest, MD  calcium acetate (PHOSLO) 667 MG capsule Take 2 capsules (1,334 mg total) by mouth 3 (three) times daily with meals. 09/02/16  Yes Hildred Priest, MD  carvedilol (COREG) 25 MG tablet Take 1 tablet (25 mg total) by mouth 2 (two) times daily with a meal. 09/02/16  Yes Hildred Priest, MD  cloNIDine (CATAPRES) 0.1 MG tablet Take 1 tablet (0.1 mg total) by mouth every 8 (eight) hours. 09/02/16  Yes Hildred Priest, MD  furosemide (LASIX) 80 MG tablet Take 1 tablet (80 mg total) by mouth 2 (two) times daily. Patient taking differently: Take 80 mg by mouth 2 (two) times daily as needed for fluid or edema.  09/02/16  Yes Hildred Priest, MD  haloperidol (HALDOL) 0.5 MG tablet Take 1 tablet (0.5 mg total) by mouth every 8 (eight) hours as needed for agitation. Patient taking differently: Take 0.5 mg by mouth at bedtime.  09/03/16  Yes Hildred Priest, MD  insulin aspart (NOVOLOG) 100 UNIT/ML injection Inject 5 Units into the skin 3 (three) times daily with meals. 09/03/16  Yes Hildred Priest, MD  insulin glargine (LANTUS) 100 UNIT/ML injection Inject 0.22 mLs (22 Units total) into the skin at bedtime. 09/03/16  Yes Hildred Priest, MD  multivitamin (RENA-VIT) TABS tablet Take 1 tablet by mouth at bedtime. 09/02/16  Yes Hildred Priest, MD  sertraline (ZOLOFT) 50 MG tablet Take 1 tablet (50 mg total) by mouth daily. 09/02/16  Yes Hildred Priest, MD  traZODone (DESYREL) 50 MG tablet Take 0.5 tablets (25 mg total)  by mouth at bedtime. 09/02/16  Yes Hildred Priest, MD  doxercalciferol (HECTOROL) 4 MCG/2ML injection Inject 0.5 mLs (1 mcg total) into the vein Every Tuesday,Thursday,and Saturday with dialysis. 09/03/16   Hildred Priest, MD  lidocaine-prilocaine (EMLA) cream Apply 1 application topically See admin instructions. Apply to dialysis site one hour prior to dialysis    [provider]  LORazepam (ATIVAN) 2 MG/ML injection Inject 1 mL (2 mg total) into the vein Every Tuesday,Thursday,and Saturday with dialysis. 09/03/16   Hildred Priest, MD   Dg Chest 2 View  Result Date: 01/20/2017 CLINICAL DATA:  Recent fall with right-sided chest pain, initial encounter EXAM: CHEST  2 VIEW COMPARISON:  12/31/2016 FINDINGS: Cardiac shadow remains enlarged. Vascular stent is noted in the left arm. The lungs are clear bilaterally. No focal infiltrate or sizable effusion is seen. Previously noted changes have resolved in the interval. No sizable effusion is seen. The bony structures show no focal abnormality. IMPRESSION: No active cardiopulmonary  disease. Electronically Signed   By: Inez Catalina M.D.   On: 01/20/2017 16:00   - pertinent xrays, CT, MRI studies were reviewed and independently interpreted  Positive ROS: All other systems have been reviewed and were otherwise negative with the exception of those mentioned in the HPI and as above.  Physical Exam: General: Alert, no acute distress Psychiatric: Patient is competent for consent with normal mood and affect Lymphatic: No axillary or cervical lymphadenopathy Cardiovascular: No pedal edema Respiratory: No cyanosis, no use of accessory musculature GI: No organomegaly, abdomen is soft and non-tender  Skin: Examination there is swelling mass lateral left thigh there is no cellulitis no skin color or temperature changes. Patient does have tenderness to palpation.   Neurologic: Patient does not have protective sensation bilateral lower extremities.   MUSCULOSKELETAL:  Examination patient has a stable transmetatarsal amputation. She has swelling over the vastus lateralis. Review of the MRI scan shows edema in the vastus lateralis and vastus intermedius. Previous attempt at aspiration was negative for abscess.  Assessment: Assessment: Painful swelling and edema vastus lateralis and vastus intermedius left thigh.  Plan: Plan: We'll order a uric acid. We'll plan for surgical debridement on Wednesday.  Thank you for the consult and the opportunity to see Ms. Ria Clock, MD [facility] [phone] 6:48 AM

## 2017-01-21 NOTE — Progress Notes (Signed)
I have seen and examined this patient and agree with the plan of care . Patient is doing well on dialysis no complaints this morning   BP 125/75  Goal 2 L No edema   Labs pending   No complaints   [name] W 01/21/2017, 7:56 AM

## 2017-01-21 NOTE — Progress Notes (Signed)
PROGRESS NOTE    [name]  JKK:[id number] DOB: August 31, 1965 DOA: 01/16/2017 PCP: Patient, [name]  Outpatient Specialists:     Brief Narrative:   50 ? ESRD TTS [place] Witness-does not get blood transfusion Systolic/diastolic heart failure last EF 40% DM TY2 with complications of neuropathy Bipolar + schizoaffective Chronic hepatitis C Chronic pain syndrome complicated by  Polysubstance marijuana/cocaine/street drugs? 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 Documented medical noncompliance   Admitted 6/14 with painful swollen left leg and dark stool in the setting of taking Goody powders Eventually found to have a large abscess in the left leg-started on vancomycin and Maxipime and and Protonix on admission- Hemoglobin on admit 6   Assessment & Plan:   Principal Problem:   Cellulitis of left leg Active Problems:   Anemia of chronic kidney failure   Chronic pain syndrome   ESRD (end stage renal disease) ([place])   Diabetes mellitus with complication ([place])   Upper GI bleed   External hemorrhoids   Diverticulosis of colon without hemorrhage   Goals of care, counseling/discussion   Palliative care encounter   Left leg cellulitis, with abscess vs hematoma  -MRI 6/14 left femur showed fluid collection 400cc, underwent IR aspiration on 6/16, with return of only 30 mL of fluid aspirate.  Continue IV antibiotics, vanco, maxipime  Dr. Maylene Roes Consult general surgery-as abscess is complicated they are recommending discussion with her primary edition Dr. Sharol Given who has been consulted 6/18 NPO after midnight-follow URIC acid-for surgery 6/20 Blood cultures NGTD  Pain control, antiemetic for nausea   Blood loss anemia, normocytic Lower GI bleed secondary to external hemorrhoids  No blood transfusions due to being Jehovah's Witness Stop all aspirin, ibuprofen Hemoglobin was around 6 when admitted and still 6.8 S/p flex sig 6/15 found external  hemorrhoids, source of bleed, not suitable to band, no sign of blood in colon  Hemoglobin being managed by nephrology with injections of Nulecit and Aranesp--might need further Nulecit As going for surgery, might consider blood recylcer. She declines blood transfusion  ESRD TThS Hypovolemic hyponatremia Secondary hyperparathyroidism Nephrology for dialysis  Type 2 diabetes, with hyperglycemia Lantus 13 U daily Sliding scale insulin, sugars 103--124  Essential hypertension. -Continue Norvasc, Coreg, catapres  Not controlled so might augment meds but probably be elevated because of pain Hyperlipidemia -Continue Lipitor  Depression/anxiety Chronic pain with polysubstance abuse  Noncompliance with medical therapy  -Continue Zoloft, Ativan - escalated narcotics from every 6 when necessary 2 every 4 when necessary  Left plantar stump wound -Consult wound nurse, dressing change recommendations   SCDs Inpatient Appreciate Ortho input For surgery 6/19---might need to use blood cylcer at surgery?  Consultants:   General surgery  Nephrology  Orthopedics   Procedures:   Attempt at I&D of 400 cm abscess versus hematoma   Antimicrobials:   Cefepime  Vancomycin   Subjective:  Fair Less pain Asking for increase in pain meds Long discussion ensued regarding risks and benefits of this especially pre-op No othe rissue   Objective: Vitals:   01/21/17 0443 01/21/17 0704 01/21/17 0714 01/21/17 0730  BP: (!) 126/48 (!) 148/74 (!) 152/74 (!) 141/73  Pulse: 69 73 71 73  Resp: 18 (!) 23    Temp: 97.8 F (36.6 C) 98 F (36.7 C)    TempSrc: Oral Oral    SpO2: 98%     Weight:  63.4 kg (139 lb 12.4 oz)    Height:  Intake/Output Summary (Last 24 hours) at 01/21/17 0824 Last data filed at 01/21/17 0443  Gross per 24 hour  Intake              180 ml  Output                0 ml  Net              180 ml   Filed Weights   01/18/17 2234 01/19/17 2054  01/21/17 0704  Weight: 61.6 kg (135 lb 12.9 oz) 62 kg (136 lb 11 oz) 63.4 kg (139 lb 12.4 oz)    Examination:  Alert oriented EOMI NCAT Chest is clinically clear Abdomen soft nontender nondistended with no rebound Patient has a left Lis-Franc amputation with a large area in the left thigh of mobile mass-there is no slipping and there is no clear demarcation    Data Reviewed: I have personally reviewed following labs and imaging studies  CBC:  Recent Labs Lab 01/16/17 1007  01/18/17 0520 01/19/17 0915 01/20/17 0614 01/21/17 0602 01/21/17 0700  WBC 12.1*  < > 8.5 8.3 6.4 6.3 6.8  NEUTROABS 10.4*  --  6.5 6.9 4.9 4.6  --   HGB 6.6*  < > 5.9* 6.7* 7.0* 7.1* 6.8*  HCT 20.6*  < > 19.3* 22.6* 22.8* 22.6* 21.8*  MCV 86.6  < > 88.9 92.6 92.3 89.7 88.6  PLT 304  < > 261 294 287 282 274  < > = values in this interval not displayed. Basic Metabolic Panel:  Recent Labs Lab 01/16/17 1300 01/16/17 1452  01/18/17 0520 01/19/17 0915 01/20/17 0614 01/21/17 0602 01/21/17 0700  NA  --  128*  < > 130* 131* 126* 126* 125*  K  --  4.6  < > 4.6 3.4* 4.3 4.1 4.1  CL  --  92*  < > 93* 94* 89* 87* 87*  CO2  --  26  < > 28 26 25 27 27   GLUCOSE  --  193*  < > 118* 304* 224* 103* 110*  BUN  --  30*  < > 24* 18 26* 34* 35*  CREATININE  --  4.40*  < > 5.31* 4.23* 5.40* 6.63* 6.75*  CALCIUM  --  8.2*  < > 8.4* 8.5* 8.6* 8.5* 8.4*  PHOS 3.7 2.7  --   --   --   --   --  4.6  < > = values in this interval not displayed. GFR: Estimated Creatinine Clearance: 8.6 mL/min (A) (by C-G formula based on SCr of 6.75 mg/dL (H)). Liver Function Tests:  Recent Labs Lab 01/16/17 1007 01/16/17 1300 01/16/17 1452 01/21/17 0700  AST 26 26  --   --   ALT 19 18  --   --   ALKPHOS 111 107  --   --   BILITOT 0.8 0.9  --   --   PROT 7.8 8.2*  --   --   ALBUMIN 2.6* 2.6* 2.3* 2.3*   No results for input(s): LIPASE, AMYLASE in the last 168 hours. No results for input(s): AMMONIA in the last 168  hours. Coagulation Profile:  Recent Labs Lab 01/16/17 1007  INR 1.29   Cardiac Enzymes:  Recent Labs Lab 01/16/17 1007  CKTOTAL 158   BNP (last 3 results) No results for input(s): PROBNP in the last 8760 hours. HbA1C: No results for input(s): HGBA1C in the last 72 hours. CBG:  Recent Labs Lab 01/19/17 2053 01/20/17 0805 01/20/17 1150  01/20/17 1638 01/20/17 2036  GLUCAP 194* 232* 221* 105* 97   Lipid Profile: No results for input(s): CHOL, HDL, LDLCALC, TRIG, CHOLHDL, LDLDIRECT in the last 72 hours. Thyroid Function Tests: No results for input(s): TSH, T4TOTAL, FREET4, T3FREE, THYROIDAB in the last 72 hours. Anemia Panel: No results for input(s): VITAMINB12, FOLATE, FERRITIN, TIBC, IRON, RETICCTPCT in the last 72 hours. Urine analysis:    Component Value Date/Time   COLORURINE YELLOW 01/01/2017 0054   APPEARANCEUR HAZY (A) 01/01/2017 0054   LABSPEC 1.013 01/01/2017 0054   PHURINE 8.0 01/01/2017 0054   GLUCOSEU >=500 (A) 01/01/2017 0054   HGBUR SMALL (A) 01/01/2017 0054   BILIRUBINUR NEGATIVE 01/01/2017 0054   BILIRUBINUR neg 12/13/2014 1127   KETONESUR NEGATIVE 01/01/2017 0054   PROTEINUR >=300 (A) 01/01/2017 0054   UROBILINOGEN 0.2 05/01/2015 1252   NITRITE NEGATIVE 01/01/2017 0054   LEUKOCYTESUR NEGATIVE 01/01/2017 0054   Sepsis Labs: @LABRCNTIP (procalcitonin:4,lacticidven:4)  ) Recent Results (from the past 240 hour(s))  Culture, blood (Routine X 2) w Reflex to ID Panel     Status: None   Collection Time: 01/16/17 10:00 AM  Result Value Ref Range Status   Specimen Description BLOOD RIGHT ANTECUBITAL  Final   Special Requests   Final    BOTTLES DRAWN AEROBIC AND ANAEROBIC Blood Culture adequate volume   Culture NO GROWTH 5 DAYS  Final   Report Status 01/21/2017 FINAL  Final  Culture, blood (Routine X 2) w Reflex to ID Panel     Status: None   Collection Time: 01/16/17 10:10 AM  Result Value Ref Range Status   Specimen Description BLOOD RIGHT HAND   Final   Special Requests   Final    BOTTLES DRAWN AEROBIC ONLY Blood Culture adequate volume   Culture NO GROWTH 5 DAYS  Final   Report Status 01/21/2017 FINAL  Final  Culture, blood (routine x 2)     Status: None (Preliminary result)   Collection Time: 01/18/17  9:05 AM  Result Value Ref Range Status   Specimen Description BLOOD HEMODIALYSIS FISTULA  Final   Special Requests   Final    BOTTLES DRAWN AEROBIC AND ANAEROBIC Blood Culture results may not be optimal due to an excessive volume of blood received in culture bottles   Culture NO GROWTH 3 DAYS  Final   Report Status PENDING  Incomplete  Culture, blood (routine x 2)     Status: None (Preliminary result)   Collection Time: 01/18/17  9:20 AM  Result Value Ref Range Status   Specimen Description BLOOD HEMODIALYSIS FISTULA  Final   Special Requests   Final    BOTTLES DRAWN AEROBIC AND ANAEROBIC Blood Culture results may not be optimal due to an excessive volume of blood received in culture bottles   Culture NO GROWTH 3 DAYS  Final   Report Status PENDING  Incomplete  Aerobic/Anaerobic Culture (surgical/deep wound)     Status: None (Preliminary result)   Collection Time: 01/18/17  4:03 PM  Result Value Ref Range Status   Specimen Description ABSCESS THIGH  Final   Special Requests NONE  Final   Gram Stain   Final    MODERATE WBC PRESENT, PREDOMINANTLY PMN NO ORGANISMS SEEN    Culture NO GROWTH 3 DAYS  Final   Report Status PENDING  Incomplete         Radiology Studies: Dg Chest 2 View  Result Date: 01/20/2017 CLINICAL DATA:  Recent fall with right-sided chest pain, initial encounter EXAM: CHEST  2  VIEW COMPARISON:  12/31/2016 FINDINGS: Cardiac shadow remains enlarged. Vascular stent is noted in the left arm. The lungs are clear bilaterally. No focal infiltrate or sizable effusion is seen. Previously noted changes have resolved in the interval. No sizable effusion is seen. The bony structures show no focal abnormality.  IMPRESSION: No active cardiopulmonary disease. Electronically Signed   By: Inez Catalina M.D.   On: 01/20/2017 16:00        Scheduled Meds: . amLODipine  10 mg Oral QHS  . atorvastatin  40 mg Oral QHS  . calcium acetate  1,334 mg Oral TID WC  . carvedilol  25 mg Oral BID WC  . cloNIDine  0.1 mg Oral Q8H  . darbepoetin (ARANESP) injection - DIALYSIS  200 mcg Intravenous Q Thu-HD  . doxercalciferol  1 mcg Intravenous Q T,Th,Sa-HD  . feeding supplement (NEPRO CARB STEADY)  237 mL Oral BID BM  . haloperidol  0.5 mg Oral QHS  . hydrocortisone   Rectal BID  . insulin aspart  0-15 Units Subcutaneous TID WC  . insulin aspart  0-5 Units Subcutaneous QHS  . insulin glargine  13 Units Subcutaneous Daily  . LORazepam  2 mg Intravenous Q T,Th,Sa-HD  . multivitamin  1 tablet Oral QHS  . pantoprazole  20 mg Oral Daily  . sertraline  50 mg Oral Daily  . sodium chloride flush  3 mL Intravenous Q12H  . sodium chloride flush  3 mL Intravenous Q12H  . traZODone  25 mg Oral QHS   Continuous Infusions: . sodium chloride    . ceFEPime (MAXIPIME) IV Stopped (01/18/17 1918)  . ferric gluconate (FERRLECIT/NULECIT) IV Stopped (01/21/17 1052)  . vancomycin Stopped (01/21/17 1114)     LOS: 5 days    Time spent: [place], MD [facility]   If 7PM-7AM, please contact night-coverage [email or web address] Password [facility] 01/21/2017, 8:24 AM

## 2017-01-21 NOTE — Progress Notes (Signed)
CRITICAL VALUE ALERT  Critical Value:  Hgb 6.8  Date & Time Notied:  0810  Provider Notified: Justin Mend  Orders Received/Actions taken: Notify primary MD per floor RN patient is Jehovah's witness

## 2017-01-21 NOTE — Progress Notes (Signed)
PT Cancellation Note  Patient Details Name: [name] MRN: [id number] DOB: 1966/03/28   Cancelled Treatment:    Reason Eval/Treat Not Completed: Patient at procedure or test/unavailable (Pt in HD this am. Will check back as able. Thanks. ))   Denice Paradise 01/21/2017, 8:53 AM [facility] [phone] (pager)

## 2017-01-21 NOTE — Progress Notes (Signed)
Pt. didn't want Oxyir and wanted Dialudid IV; c/o's increase pain (L) thigh; Dr. Hal Hope informed.

## 2017-01-21 NOTE — Progress Notes (Signed)
Patient ID: [name], female   DOB: 10/22/1965, 51 y.o.   MRN: [id number] Patient's uric acid is elevated. This pain and swelling may be due to gout. I started her on colchicine twice a day. We will hold on surgery tomorrow and see how she does with the colchicine.

## 2017-01-21 NOTE — Plan of Care (Signed)
Problem: Tissue Perfusion: Goal: Risk factors for ineffective tissue perfusion will decrease Outcome: Progressing Pt. up with assist to bathroom.

## 2017-01-22 LAB — CBC WITH DIFFERENTIAL/PLATELET
BASOS PCT: 0 %
Basophils Absolute: 0 10*3/uL (ref 0.0–0.1)
EOS ABS: 0.2 10*3/uL (ref 0.0–0.7)
EOS PCT: 3 %
HEMATOCRIT: 26.6 % — AB (ref 36.0–46.0)
HEMOGLOBIN: 7.9 g/dL — AB (ref 12.0–15.0)
Lymphocytes Relative: 21 %
Lymphs Abs: 1.4 10*3/uL (ref 0.7–4.0)
MCH: 28 pg (ref 26.0–34.0)
MCHC: 29.7 g/dL — AB (ref 30.0–36.0)
MCV: 94.3 fL (ref 78.0–100.0)
Monocytes Absolute: 0.5 10*3/uL (ref 0.1–1.0)
Monocytes Relative: 8 %
NEUTROS ABS: 4.6 10*3/uL (ref 1.7–7.7)
Neutrophils Relative %: 68 %
Platelets: 315 10*3/uL (ref 150–400)
RBC: 2.82 MIL/uL — ABNORMAL LOW (ref 3.87–5.11)
RDW: 22.6 % — ABNORMAL HIGH (ref 11.5–15.5)
WBC: 6.7 10*3/uL (ref 4.0–10.5)

## 2017-01-22 LAB — BASIC METABOLIC PANEL
Anion gap: 9 (ref 5–15)
BUN: 18 mg/dL (ref 6–20)
CHLORIDE: 95 mmol/L — AB (ref 101–111)
CO2: 28 mmol/L (ref 22–32)
CREATININE: 4.51 mg/dL — AB (ref 0.44–1.00)
Calcium: 8.6 mg/dL — ABNORMAL LOW (ref 8.9–10.3)
GFR calc Af Amer: 12 mL/min — ABNORMAL LOW (ref >60)
GFR calc non Af Amer: 10 mL/min — ABNORMAL LOW (ref >60)
GLUCOSE: 153 mg/dL — AB (ref 65–99)
Potassium: 3.6 mmol/L (ref 3.5–5.1)
SODIUM: 132 mmol/L — AB (ref 135–145)

## 2017-01-22 LAB — RENAL FUNCTION PANEL
Albumin: 2.5 g/dL — ABNORMAL LOW (ref 3.5–5.0)
Anion gap: 9 (ref 5–15)
BUN: 17 mg/dL (ref 6–20)
CO2: 28 mmol/L (ref 22–32)
Calcium: 8.7 mg/dL — ABNORMAL LOW (ref 8.9–10.3)
Chloride: 95 mmol/L — ABNORMAL LOW (ref 101–111)
Creatinine, Ser: 4.71 mg/dL — ABNORMAL HIGH (ref 0.44–1.00)
GFR calc Af Amer: 11 mL/min — ABNORMAL LOW (ref >60)
GFR calc non Af Amer: 10 mL/min — ABNORMAL LOW (ref >60)
Glucose, Bld: 148 mg/dL — ABNORMAL HIGH (ref 65–99)
Phosphorus: 4.3 mg/dL (ref 2.5–4.6)
Potassium: 3.7 mmol/L (ref 3.5–5.1)
Sodium: 132 mmol/L — ABNORMAL LOW (ref 135–145)

## 2017-01-22 LAB — GLUCOSE, CAPILLARY
GLUCOSE-CAPILLARY: 198 mg/dL — AB (ref 65–99)
GLUCOSE-CAPILLARY: 189 mg/dL — AB (ref 65–99)
GLUCOSE-CAPILLARY: 160 mg/dL — AB (ref 65–99)
Glucose-Capillary: 181 mg/dL — ABNORMAL HIGH (ref 65–99)

## 2017-01-22 NOTE — Progress Notes (Signed)
[facility] Progress Note   Subjective: "My leg is still hurting and now my back is hurting from that fall..." Unable to assess if there is any improvement with colchicine.   Objective Vitals:   01/21/17 1710 01/21/17 2031 01/22/17 0458 01/22/17 0835  BP: (!) 122/50 (!) 134/48 (!) 145/66 (!) 126/108  Pulse: 72 75 75 65  Resp: 18 18 18 18   Temp: 98.4 F (36.9 C) 98.4 F (36.9 C) 97.7 F (36.5 C) 97.7 F (36.5 C)  TempSrc: Oral Oral Oral Oral  SpO2: 95% 97% 100% 99%  Weight:  62.6 kg (138 lb)    Height:       Physical Exam General: Less anxious, still C/O pain. Pleasant NAD Heart: S1,S2, RRR Lungs: CTAB A/P. Has alyvn drsg L posterior chest. No WOB.  Abdomen: Active BS.  Extremities: persistent warmth/swelling L mid thigh. No LE Edema.  Dialysis Access: LFA AVG + bruit   Additional Objective Labs: Basic Metabolic Panel:  Recent Labs Lab 01/16/17 1300 01/16/17 1452  01/21/17 0602 01/21/17 0700 01/22/17 0454  NA  --  128*  < > 126* 125* 132*  K  --  4.6  < > 4.1 4.1 3.6  CL  --  92*  < > 87* 87* 95*  CO2  --  26  < > 27 27 28   GLUCOSE  --  193*  < > 103* 110* 153*  BUN  --  30*  < > 34* 35* 18  CREATININE  --  4.40*  < > 6.63* 6.75* 4.51*  CALCIUM  --  8.2*  < > 8.5* 8.4* 8.6*  PHOS 3.7 2.7  --   --  4.6  --   < > = values in this interval not displayed. Liver Function Tests:  Recent Labs Lab 01/16/17 1007 01/16/17 1300 01/16/17 1452 01/21/17 0700  AST 26 26  --   --   ALT 19 18  --   --   ALKPHOS 111 107  --   --   BILITOT 0.8 0.9  --   --   PROT 7.8 8.2*  --   --   ALBUMIN 2.6* 2.6* 2.3* 2.3*   No results for input(s): LIPASE, AMYLASE in the last 168 hours. CBC:  Recent Labs Lab 01/19/17 0915 01/20/17 0614 01/21/17 0602 01/21/17 0700 01/22/17 0454  WBC 8.3 6.4 6.3 6.8 6.7  NEUTROABS 6.9 4.9 4.6  --  4.6  HGB 6.7* 7.0* 7.1* 6.8* 7.9*  HCT 22.6* 22.8* 22.6* 21.8* 26.6*  MCV 92.6 92.3 89.7 88.6 94.3  PLT 294 287 282 274 315    Blood Culture    Component Value Date/Time   SDES ABSCESS THIGH 01/18/2017 1603   SPECREQUEST NONE 01/18/2017 1603   CULT NO GROWTH 3 DAYS 01/18/2017 1603   REPTSTATUS PENDING 01/18/2017 1603    Cardiac Enzymes:  Recent Labs Lab 01/16/17 1007  CKTOTAL 158   CBG:  Recent Labs Lab 01/20/17 2036 01/21/17 1143 01/21/17 1648 01/21/17 2030 01/22/17 0751  GLUCAP 97 124* 136* 156* 181*   Iron Studies: No results for input(s): IRON, TIBC, TRANSFERRIN, FERRITIN in the last 72 hours. @lablastinr3 @ Studies/Results: Dg Chest 2 View  Result Date: 01/20/2017 CLINICAL DATA:  Recent fall with right-sided chest pain, initial encounter EXAM: CHEST  2 VIEW COMPARISON:  12/31/2016 FINDINGS: Cardiac shadow remains enlarged. Vascular stent is noted in the left arm. The lungs are clear bilaterally. No focal infiltrate or sizable effusion is seen. Previously noted changes have resolved  in the interval. No sizable effusion is seen. The bony structures show no focal abnormality. IMPRESSION: No active cardiopulmonary disease. Electronically Signed   By: Inez Catalina M.D.   On: 01/20/2017 16:00   Medications: . sodium chloride    . ceFEPime (MAXIPIME) IV Stopped (01/21/17 1836)  . ferric gluconate (FERRLECIT/NULECIT) IV Stopped (01/21/17 1052)  . vancomycin Stopped (01/21/17 1114)   . [START ON 01/23/2017] allopurinol  100 mg Oral Q T,Th,Sat-1800  . amLODipine  10 mg Oral QHS  . atorvastatin  40 mg Oral QHS  . calcium acetate  1,334 mg Oral TID WC  . carvedilol  25 mg Oral BID WC  . cloNIDine  0.1 mg Oral Q8H  . darbepoetin (ARANESP) injection - DIALYSIS  200 mcg Intravenous Q Thu-HD  . doxercalciferol  1 mcg Intravenous Q T,Th,Sa-HD  . feeding supplement (NEPRO CARB STEADY)  237 mL Oral BID BM  . haloperidol  0.5 mg Oral QHS  . hydrocortisone   Rectal BID  . insulin aspart  0-15 Units Subcutaneous TID WC  . insulin aspart  0-5 Units Subcutaneous QHS  . insulin glargine  13 Units  Subcutaneous Daily  . LORazepam  2 mg Intravenous Q T,Th,Sa-HD  . multivitamin  1 tablet Oral QHS  . pantoprazole  20 mg Oral Daily  . sertraline  50 mg Oral Daily  . sodium chloride flush  3 mL Intravenous Q12H  . sodium chloride flush  3 mL Intravenous Q12H  . traZODone  25 mg Oral QHS   Dialysis Orders:[facility] T,TH,S 3.5 hrs 63.5 kg 400/800  2.0 K/2.25 Ca UF profile 2 -Heparin: NONE -Mircera 200 mcg IV q 2 weeks (last dose 150 mcg IV 01/07/17) -Venofer 50 mg IV weekly (last dose 01/11/2017 Last Fe 36 Ferritin 876 Tsat 18% 12/26/16-rec'd venofer 100 mg IV X 8 doses) -Hectorol 2 mcg IV TIW (Last PTH 593 12/26/16)   BMD meds:  Calcium Acetate 667 mg 2 capsules TID AC  Renvela 800 mg 3 tabs PO TID AC   Assessment/Plan: 1. L. Thigh abcess vs hematoma. Surgeryl consulted however has high uric acid level. Surgery postponed for today. Dr. Sharol Given wants to give trial of colchicine prior to surgical intervention. BC NGTD. Per primary.  2. ESRD -T,Th, S. Next HD 01/23/17 on schedule. K+ 3.6 Use 4.0 K bath.  3. Anemia - Jehovah witness/does not accept blood transfusions. HGB 7.9. Getting ESA/Fe load.  4. Secondary hyperparathyroidism - Add renal function to today's labs. Ca 8.6 last phos 4.6. Continue binders.  5. HTN/volume - Last HD 01/21/17 Pre wt 63.4 kg Net UF 1.0 liter Post wt 62.7 kg. Now slightly under EDW. 1-1.5 liters UFG with HD tomorrow. Adjust EDW on DC.  6. Nutrition - Albumin 2.3. Renal diet/renal vits. 7. DM: per primary 8. H/O Bipolar/polysubstance abuse: Psych meds per primary  Rita H. Brown NP-C 01/22/2017, 8:54 AM  [employer] [phone]

## 2017-01-22 NOTE — Care Management Important Message (Signed)
Important Message  Patient Details  Name: [name] MRN: [id number] Date of Birth: 02-Apr-1966   Medicare Important Message Given:  Yes    [name] [address] 01/22/2017, 8:46 AM

## 2017-01-22 NOTE — Progress Notes (Signed)
Pharmacy Antibiotic Note [name] is a 51 y.o. female admitted on 01/16/2017 with concern for LE cellulitis.   Currently on day 7 of Cefepime and vancomycin. ESRD on TTS,.   Plan: Vancomycin 750mg  post-HD on TTS Cefepime 2gm post-HD on TTS Vancomycin random level in AM  Height: 5\' 4"  (162.6 cm) Weight: 138 lb (62.6 kg) IBW/kg (Calculated) : 54.7  Temp (24hrs), Avg:98.2 F (36.8 C), Min:97.7 F (36.5 C), Max:98.8 F (37.1 C)   Recent Labs Lab 01/16/17 1019  01/19/17 0915 01/20/17 0614 01/21/17 0602 01/21/17 0700 01/22/17 0454  WBC  --   < > 8.3 6.4 6.3 6.8 6.7  CREATININE  --   < > 4.23* 5.40* 6.63* 6.75* 4.71*  4.51*  LATICACIDVEN 0.82  --   --   --   --   --   --   < > = values in this interval not displayed.  Estimated Creatinine Clearance: 12.9 mL/min (A) (by C-G formula based on SCr of 4.51 mg/dL (H)).    Allergies  Allergen Reactions  . Penicillins Hives and Other (See Comments)    Has patient had a PCN reaction causing immediate rash, facial/tongue/throat swelling, SOB or lightheadedness with hypotension: Yes Has patient had a PCN reaction causing severe rash involving mucus membranes or skin necrosis: No Has patient had a PCN reaction that required hospitalization No Has patient had a PCN reaction occurring within the last 10 years: No If all of the above answers are "NO", then may proceed with Cephalosporin use. Pt tolerates cefepime and ceftriaxone  . Gabapentin Itching and Swelling  . Lyrica [Pregabalin] Swelling and Other (See Comments)    Facial and leg swelling  . Saphris [Asenapine] Swelling and Other (See Comments)    Facial swelling  . Tramadol Swelling and Other (See Comments)    Leg swelling  . Other Other (See Comments)    NO BLOOD PRODUCTS. PT IS A JEHOVAH WITNESS    Antimicrobials this admission: 6/14 Cefepime >>  6/14 vancomycin >>   Microbiology results: 6/14 BCx: NGTD 6/14 MRSA PCR:   Thank you  Anette Guarneri,  PharmD [phone]  01/22/2017, 10:29 AM

## 2017-01-22 NOTE — Progress Notes (Addendum)
PROGRESS NOTE    [name]  ENI:[id number] DOB: January 26, 1966 DOA: 01/16/2017 PCP: Patient, [name]  Outpatient Specialists:     Brief Narrative:   50 ? ESRD TTS [place] Witness-does not get blood transfusion Systolic/diastolic heart failure last EF 40% DM TY2 with complications of neuropathy Bipolar + schizoaffective Chronic hepatitis C Chronic pain syndrome complicated by  Polysubstance marijuana/cocaine/street drugs? 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 Documented medical noncompliance   Admitted 6/14 with painful swollen left leg and dark stool in the setting of taking Goody powders Eventually found to have a large abscess in the left leg-started on vancomycin and Maxipime and and Protonix on admission- Hemoglobin on admit 6   Assessment & Plan:   Principal Problem:   Cellulitis of left leg Active Problems:   Anemia of chronic kidney failure   Chronic pain syndrome   ESRD (end stage renal disease) ([place])   Diabetes mellitus with complication ([place])   Upper GI bleed   External hemorrhoids   Diverticulosis of colon without hemorrhage   Goals of care, counseling/discussion   Palliative care encounter   Left leg cellulitis-possible that this could represent gout? -MRI 6/14 left femur showed fluid collection 400cc, underwent IR aspiration on 6/16, with return of only 30 mL of fluid aspirate.  On IV antibiotics, vanco, maxipime since 6/14--no fever spike.  Abx d/c 6/20  Dr. Maylene Roes Consult general surgery-edition Dr. Sharol Given consulted 6/18  Uric acid 7-started colchicine 0.6 bid, Allopurinol 100 TTS and significant improvement noted to left thigh Blood cultures NGTD  Pain control, antiemetic for nausea   Blood loss anemia, normocytic Lower GI bleed secondary to external hemorrhoids  No blood transfusions due to being Jehovah's Witness Stop all aspirin, ibuprofen Hemoglobin ~6 to 7 S/p flex sig 6/15 found external hemorrhoids,  source of bleed, not suitable to band, no sign of blood in colon  Hemoglobin being managed by nephrology with injections of Nulecit and Aranesp--might need further Nulecit As going for surgery, might consider blood recylcer. She declines blood transfusion  ESRD TThS Hypovolemic hyponatremia Secondary hyperparathyroidism Nephrology for dialysis  Type 2 diabetes, with hyperglycemia Lantus 13 U daily Sliding scale insulin, sugars 153-198  Essential hypertension. -Continue Norvasc 10 , Coreg 25 bid, catapres 0.1 q8 better controlled as less pain  Hyperlipidemia -Continue Lipitor  Depression/anxiety Chronic pain with polysubstance abuse  Noncompliance with medical therapy  -Continue Zoloft, Ativan - escalated narcotics from every 6 when necessary 2 every 4 when necessary--will wean as possible in next 1-2 days  Left plantar stump wound -Consult wound nurse, dressing change recommendations   SCDs Inpatient Appreciate Ortho input Patient refusing therapy services and wishes to be independent  Consultants:   General surgery  Nephrology  Orthopedics   Procedures:   Attempt at I&D of 400 cm abscess versus hematoma   Antimicrobials:   Cefepime  Vancomycin   Subjective:  Fair Less pain "im having bad dreams" Eating and drinking fair No n/v   Objective: Vitals:   01/21/17 1710 01/21/17 2031 01/22/17 0458 01/22/17 0835  BP: (!) 122/50 (!) 134/48 (!) 145/66 (!) 126/108  Pulse: 72 75 75 65  Resp: 18 18 18 18   Temp: 98.4 F (36.9 C) 98.4 F (36.9 C) 97.7 F (36.5 C) 97.7 F (36.5 C)  TempSrc: Oral Oral Oral Oral  SpO2: 95% 97% 100% 99%  Weight:  62.6 kg (138 lb)    Height:        Intake/Output Summary (Last 24  hours) at 01/22/17 1416 Last data filed at 01/22/17 1316  Gross per 24 hour  Intake              960 ml  Output                0 ml  Net              960 ml   Filed Weights   01/21/17 0704 01/21/17 1118 01/21/17 2031  Weight: 63.4  kg (139 lb 12.4 oz) 62.7 kg (138 lb 3.7 oz) 62.6 kg (138 lb)    Examination:  Alert oriented--was sleepy when I woke her EOMI NCAT Chest is clinically clear Abdomen soft nontender nondistended Patient has a left Lis-Franc amputation --mass seems less tender and swollen and has improved in terms of size from prior exam   Data Reviewed: I have personally reviewed following labs and imaging studies  CBC:  Recent Labs Lab 01/18/17 0520 01/19/17 0915 01/20/17 0614 01/21/17 0602 01/21/17 0700 01/22/17 0454  WBC 8.5 8.3 6.4 6.3 6.8 6.7  NEUTROABS 6.5 6.9 4.9 4.6  --  4.6  HGB 5.9* 6.7* 7.0* 7.1* 6.8* 7.9*  HCT 19.3* 22.6* 22.8* 22.6* 21.8* 26.6*  MCV 88.9 92.6 92.3 89.7 88.6 94.3  PLT 261 294 287 282 274 976   Basic Metabolic Panel:  Recent Labs Lab 01/16/17 1300 01/16/17 1452  01/19/17 0915 01/20/17 0614 01/21/17 0602 01/21/17 0700 01/22/17 0454  NA  --  128*  < > 131* 126* 126* 125* 132*  132*  K  --  4.6  < > 3.4* 4.3 4.1 4.1 3.7  3.6  CL  --  92*  < > 94* 89* 87* 87* 95*  95*  CO2  --  26  < > 26 25 27 27 28  28   GLUCOSE  --  193*  < > 304* 224* 103* 110* 148*  153*  BUN  --  30*  < > 18 26* 34* 35* 17  18  CREATININE  --  4.40*  < > 4.23* 5.40* 6.63* 6.75* 4.71*  4.51*  CALCIUM  --  8.2*  < > 8.5* 8.6* 8.5* 8.4* 8.7*  8.6*  PHOS 3.7 2.7  --   --   --   --  4.6 4.3  < > = values in this interval not displayed. GFR: Estimated Creatinine Clearance: 12.9 mL/min (A) (by C-G formula based on SCr of 4.51 mg/dL (H)). Liver Function Tests:  Recent Labs Lab 01/16/17 1007 01/16/17 1300 01/16/17 1452 01/21/17 0700 01/22/17 0454  AST 26 26  --   --   --   ALT 19 18  --   --   --   ALKPHOS 111 107  --   --   --   BILITOT 0.8 0.9  --   --   --   PROT 7.8 8.2*  --   --   --   ALBUMIN 2.6* 2.6* 2.3* 2.3* 2.5*   No results for input(s): LIPASE, AMYLASE in the last 168 hours. No results for input(s): AMMONIA in the last 168 hours. Coagulation  Profile:  Recent Labs Lab 01/16/17 1007  INR 1.29   Cardiac Enzymes:  Recent Labs Lab 01/16/17 1007  CKTOTAL 158   BNP (last 3 results) No results for input(s): PROBNP in the last 8760 hours. HbA1C: No results for input(s): HGBA1C in the last 72 hours. CBG:  Recent Labs Lab 01/21/17 1143 01/21/17 1648 01/21/17 2030 01/22/17 0751 01/22/17 1214  GLUCAP 124* 136* 156* 181* 198*   Lipid Profile: No results for input(s): CHOL, HDL, LDLCALC, TRIG, CHOLHDL, LDLDIRECT in the last 72 hours. Thyroid Function Tests: No results for input(s): TSH, T4TOTAL, FREET4, T3FREE, THYROIDAB in the last 72 hours. Anemia Panel: No results for input(s): VITAMINB12, FOLATE, FERRITIN, TIBC, IRON, RETICCTPCT in the last 72 hours. Urine analysis:    Component Value Date/Time   COLORURINE YELLOW 01/01/2017 0054   APPEARANCEUR HAZY (A) 01/01/2017 0054   LABSPEC 1.013 01/01/2017 0054   PHURINE 8.0 01/01/2017 0054   GLUCOSEU >=500 (A) 01/01/2017 0054   HGBUR SMALL (A) 01/01/2017 0054   BILIRUBINUR NEGATIVE 01/01/2017 0054   BILIRUBINUR neg 12/13/2014 1127   KETONESUR NEGATIVE 01/01/2017 0054   PROTEINUR >=300 (A) 01/01/2017 0054   UROBILINOGEN 0.2 05/01/2015 1252   NITRITE NEGATIVE 01/01/2017 0054   LEUKOCYTESUR NEGATIVE 01/01/2017 0054   Sepsis Labs: @LABRCNTIP (procalcitonin:4,lacticidven:4)  ) Recent Results (from the past 240 hour(s))  Culture, blood (Routine X 2) w Reflex to ID Panel     Status: None   Collection Time: 01/16/17 10:00 AM  Result Value Ref Range Status   Specimen Description BLOOD RIGHT ANTECUBITAL  Final   Special Requests   Final    BOTTLES DRAWN AEROBIC AND ANAEROBIC Blood Culture adequate volume   Culture NO GROWTH 5 DAYS  Final   Report Status 01/21/2017 FINAL  Final  Culture, blood (Routine X 2) w Reflex to ID Panel     Status: None   Collection Time: 01/16/17 10:10 AM  Result Value Ref Range Status   Specimen Description BLOOD RIGHT HAND  Final   Special  Requests   Final    BOTTLES DRAWN AEROBIC ONLY Blood Culture adequate volume   Culture NO GROWTH 5 DAYS  Final   Report Status 01/21/2017 FINAL  Final  Culture, blood (routine x 2)     Status: None (Preliminary result)   Collection Time: 01/18/17  9:05 AM  Result Value Ref Range Status   Specimen Description BLOOD HEMODIALYSIS FISTULA  Final   Special Requests   Final    BOTTLES DRAWN AEROBIC AND ANAEROBIC Blood Culture results may not be optimal due to an excessive volume of blood received in culture bottles   Culture NO GROWTH 3 DAYS  Final   Report Status PENDING  Incomplete  Culture, blood (routine x 2)     Status: None (Preliminary result)   Collection Time: 01/18/17  9:20 AM  Result Value Ref Range Status   Specimen Description BLOOD HEMODIALYSIS FISTULA  Final   Special Requests   Final    BOTTLES DRAWN AEROBIC AND ANAEROBIC Blood Culture results may not be optimal due to an excessive volume of blood received in culture bottles   Culture NO GROWTH 3 DAYS  Final   Report Status PENDING  Incomplete  Aerobic/Anaerobic Culture (surgical/deep wound)     Status: None (Preliminary result)   Collection Time: 01/18/17  4:03 PM  Result Value Ref Range Status   Specimen Description ABSCESS THIGH  Final   Special Requests NONE  Final   Gram Stain   Final    MODERATE WBC PRESENT, PREDOMINANTLY PMN NO ORGANISMS SEEN    Culture NO GROWTH 4 DAYS  Final   Report Status PENDING  Incomplete         Radiology Studies: Dg Chest 2 View  Result Date: 01/20/2017 CLINICAL DATA:  Recent fall with right-sided chest pain, initial encounter EXAM: CHEST  2 VIEW COMPARISON:  12/31/2016 FINDINGS:  Cardiac shadow remains enlarged. Vascular stent is noted in the left arm. The lungs are clear bilaterally. No focal infiltrate or sizable effusion is seen. Previously noted changes have resolved in the interval. No sizable effusion is seen. The bony structures show no focal abnormality. IMPRESSION: No active  cardiopulmonary disease. Electronically Signed   By: Inez Catalina M.D.   On: 01/20/2017 16:00        Scheduled Meds: . [START ON 01/23/2017] allopurinol  100 mg Oral Q T,Th,Sat-1800  . amLODipine  10 mg Oral QHS  . atorvastatin  40 mg Oral QHS  . calcium acetate  1,334 mg Oral TID WC  . carvedilol  25 mg Oral BID WC  . cloNIDine  0.1 mg Oral Q8H  . darbepoetin (ARANESP) injection - DIALYSIS  200 mcg Intravenous Q Thu-HD  . doxercalciferol  1 mcg Intravenous Q T,Th,Sa-HD  . feeding supplement (NEPRO CARB STEADY)  237 mL Oral BID BM  . haloperidol  0.5 mg Oral QHS  . hydrocortisone   Rectal BID  . insulin aspart  0-15 Units Subcutaneous TID WC  . insulin aspart  0-5 Units Subcutaneous QHS  . insulin glargine  13 Units Subcutaneous Daily  . LORazepam  2 mg Intravenous Q T,Th,Sa-HD  . multivitamin  1 tablet Oral QHS  . pantoprazole  20 mg Oral Daily  . sertraline  50 mg Oral Daily  . sodium chloride flush  3 mL Intravenous Q12H  . sodium chloride flush  3 mL Intravenous Q12H  . traZODone  25 mg Oral QHS   Continuous Infusions: . sodium chloride    . ceFEPime (MAXIPIME) IV Stopped (01/21/17 1836)  . ferric gluconate (FERRLECIT/NULECIT) IV Stopped (01/21/17 1052)  . vancomycin Stopped (01/21/17 1114)     LOS: 6 days    Time spent: Crystal Lawns, MD [facility]   If 7PM-7AM, please contact night-coverage [email or web address] Password [email or web address] 01/22/2017, 2:16 PM

## 2017-01-22 NOTE — Progress Notes (Signed)
PT Cancellation Note  Patient Details Name: DALEYZA GADOMSKI MRN: [id number] DOB: August 09, 1965   Cancelled Treatment:    Reason Eval/Treat Not Completed: Patient declined, no reason specified   Tells me she has a lot on her mind, and she does not want to participate at this time;   If she refuses again, will sign off per departmental policy;   Of note, she is asking for a back brace;   Roney Marion, [place] Pager [phone] Office [place] 01/22/2017, 11:11 AM

## 2017-01-22 NOTE — Plan of Care (Signed)
Problem: Activity: Goal: Risk for activity intolerance will decrease Outcome: Progressing Activity level seem to be increasing; pt. wanting to tidy bed this pm.

## 2017-01-23 LAB — CBC WITH DIFFERENTIAL/PLATELET
BASOS PCT: 0 %
Basophils Absolute: 0 10*3/uL (ref 0.0–0.1)
EOS PCT: 4 %
Eosinophils Absolute: 0.3 10*3/uL (ref 0.0–0.7)
HEMATOCRIT: 21.8 % — AB (ref 36.0–46.0)
Hemoglobin: 6.6 g/dL — CL (ref 12.0–15.0)
LYMPHS ABS: 1.7 10*3/uL (ref 0.7–4.0)
Lymphocytes Relative: 26 %
MCH: 28 pg (ref 26.0–34.0)
MCHC: 30.3 g/dL (ref 30.0–36.0)
MCV: 92.4 fL (ref 78.0–100.0)
MONO ABS: 0.6 10*3/uL (ref 0.1–1.0)
MONOS PCT: 9 %
NEUTROS ABS: 4 10*3/uL (ref 1.7–7.7)
Neutrophils Relative %: 61 %
PLATELETS: 245 10*3/uL (ref 150–400)
RBC: 2.36 MIL/uL — AB (ref 3.87–5.11)
RDW: 21.9 % — AB (ref 11.5–15.5)
WBC: 6.6 10*3/uL (ref 4.0–10.5)

## 2017-01-23 LAB — RENAL FUNCTION PANEL
Albumin: 2.1 g/dL — ABNORMAL LOW (ref 3.5–5.0)
Anion gap: 13 (ref 5–15)
BUN: 28 mg/dL — AB (ref 6–20)
CHLORIDE: 92 mmol/L — AB (ref 101–111)
CO2: 22 mmol/L (ref 22–32)
CREATININE: 6.03 mg/dL — AB (ref 0.44–1.00)
Calcium: 8 mg/dL — ABNORMAL LOW (ref 8.9–10.3)
GFR calc Af Amer: 9 mL/min — ABNORMAL LOW (ref >60)
GFR, EST NON AFRICAN AMERICAN: 7 mL/min — AB (ref >60)
Glucose, Bld: 219 mg/dL — ABNORMAL HIGH (ref 65–99)
POTASSIUM: 3.9 mmol/L (ref 3.5–5.1)
Phosphorus: 4.3 mg/dL (ref 2.5–4.6)
Sodium: 127 mmol/L — ABNORMAL LOW (ref 135–145)

## 2017-01-23 LAB — GLUCOSE, CAPILLARY
Glucose-Capillary: 207 mg/dL — ABNORMAL HIGH (ref 65–99)
Glucose-Capillary: 137 mg/dL — ABNORMAL HIGH (ref 65–99)
Glucose-Capillary: 117 mg/dL — ABNORMAL HIGH (ref 65–99)

## 2017-01-23 LAB — CULTURE, BLOOD (ROUTINE X 2)
CULTURE: NO GROWTH
Culture: NO GROWTH

## 2017-01-23 LAB — CBC
HCT: 21.2 % — ABNORMAL LOW (ref 36.0–46.0)
Hemoglobin: 6.5 g/dL — CL (ref 12.0–15.0)
MCH: 27.8 pg (ref 26.0–34.0)
MCHC: 30.7 g/dL (ref 30.0–36.0)
MCV: 90.6 fL (ref 78.0–100.0)
Platelets: 216 K/uL (ref 150–400)
RBC: 2.34 MIL/uL — ABNORMAL LOW (ref 3.87–5.11)
RDW: 21.5 % — ABNORMAL HIGH (ref 11.5–15.5)
WBC: 7.2 K/uL (ref 4.0–10.5)

## 2017-01-23 LAB — AEROBIC/ANAEROBIC CULTURE W GRAM STAIN (SURGICAL/DEEP WOUND)

## 2017-01-23 LAB — AEROBIC/ANAEROBIC CULTURE (SURGICAL/DEEP WOUND): CULTURE: NO GROWTH

## 2017-01-23 LAB — VANCOMYCIN, RANDOM: Vancomycin Rm: 19

## 2017-01-23 MED ORDER — DARBEPOETIN ALFA 200 MCG/0.4ML IJ SOSY
PREFILLED_SYRINGE | INTRAMUSCULAR | Status: AC
  Administered 2017-01-23: 200 ug via INTRAVENOUS
  Filled 2017-01-23: qty 0.4

## 2017-01-23 MED ORDER — LORAZEPAM 2 MG/ML IJ SOLN
INTRAMUSCULAR | Status: AC
  Administered 2017-01-23: 2 mg via INTRAVENOUS
  Filled 2017-01-23: qty 1

## 2017-01-23 MED ORDER — OXYCODONE HCL 5 MG PO TABS
ORAL_TABLET | ORAL | Status: AC
Start: 2017-01-23 — End: 2017-01-23
  Administered 2017-01-23: 10 mg via ORAL
  Filled 2017-01-23: qty 2

## 2017-01-23 MED ORDER — OXYCODONE HCL 5 MG PO TABS
10.0000 mg | ORAL_TABLET | Freq: Four times a day (QID) | ORAL | Status: DC | PRN
  Administered 2017-01-23 – 2017-01-24 (×4): 10 mg via ORAL
  Filled 2017-01-23 (×3): qty 2

## 2017-01-23 MED ORDER — DOXERCALCIFEROL 4 MCG/2ML IV SOLN
INTRAVENOUS | Status: AC
  Administered 2017-01-23: 1 ug via INTRAVENOUS
  Filled 2017-01-23: qty 2

## 2017-01-23 NOTE — Progress Notes (Signed)
[facility] Progress Note   Subjective: "I'm ready to go home." Actually difficult to assess if pain is improving because pt rambles about many different topics when asked if pain is improving but does seems more comfortable. C/O nausea-cont oral PPI/Zofran. DC phenergan.   Objective Vitals:   01/22/17 1835 01/22/17 2111 01/23/17 0448 01/23/17 0829  BP: (!) 163/66 138/73 (!) 149/56 (!) 135/39  Pulse: 76 78 76 70  Resp: 18 17 17 17   Temp: 98.9 F (37.2 C) 98.3 F (36.8 C) 98.9 F (37.2 C) 97.9 F (36.6 C)  TempSrc: Oral Oral Oral Oral  SpO2: 98% 100% 99% 100%  Weight:      Height:       Physical Exam General: Pleasant, talkative, NAD Heart: S1,S2 RRR Lungs: CTAB A/P. Alyvn drsg L posterior chest-no C/O pain, no WOB Abdomen: soft, non-tender Extremities: No LE edema Dialysis Access: LFA AVG + bruit   Additional Objective Labs: Basic Metabolic Panel:  Recent Labs Lab 01/21/17 0700 01/22/17 0454 01/23/17 0430  NA 125* 132*  132* 127*  K 4.1 3.7  3.6 3.9  CL 87* 95*  95* 92*  CO2 27 28  28 22   GLUCOSE 110* 148*  153* 219*  BUN 35* 17  18 28*  CREATININE 6.75* 4.71*  4.51* 6.03*  CALCIUM 8.4* 8.7*  8.6* 8.0*  PHOS 4.6 4.3 4.3   Liver Function Tests:  Recent Labs Lab 01/16/17 1007 01/16/17 1300  01/21/17 0700 01/22/17 0454 01/23/17 0430  AST 26 26  --   --   --   --   ALT 19 18  --   --   --   --   ALKPHOS 111 107  --   --   --   --   BILITOT 0.8 0.9  --   --   --   --   PROT 7.8 8.2*  --   --   --   --   ALBUMIN 2.6* 2.6*  < > 2.3* 2.5* 2.1*  < > = values in this interval not displayed. No results for input(s): LIPASE, AMYLASE in the last 168 hours. CBC:  Recent Labs Lab 01/20/17 0614 01/21/17 0602 01/21/17 0700 01/22/17 0454 01/23/17 0430  WBC 6.4 6.3 6.8 6.7 6.6  NEUTROABS 4.9 4.6  --  4.6 PENDING  HGB 7.0* 7.1* 6.8* 7.9* 6.6*  HCT 22.8* 22.6* 21.8* 26.6* 21.8*  MCV 92.3 89.7 88.6 94.3 92.4  PLT 287 282 274 315 245    Blood Culture    Component Value Date/Time   SDES ABSCESS THIGH 01/18/2017 1603   SPECREQUEST NONE 01/18/2017 1603   CULT NO GROWTH 4 DAYS 01/18/2017 1603   REPTSTATUS PENDING 01/18/2017 1603    Cardiac Enzymes:  Recent Labs Lab 01/16/17 1007  CKTOTAL 158   CBG:  Recent Labs Lab 01/22/17 0751 01/22/17 1214 01/22/17 1605 01/22/17 2120 01/23/17 0803  GLUCAP 181* 198* 189* 160* 207*   Iron Studies: No results for input(s): IRON, TIBC, TRANSFERRIN, FERRITIN in the last 72 hours. @lablastinr3 @ Studies/Results: No results found. Medications: . sodium chloride    . ferric gluconate (FERRLECIT/NULECIT) IV Stopped (01/21/17 1052)   . allopurinol  100 mg Oral Q T,Th,Sat-1800  . amLODipine  10 mg Oral QHS  . atorvastatin  40 mg Oral QHS  . calcium acetate  1,334 mg Oral TID WC  . carvedilol  25 mg Oral BID WC  . cloNIDine  0.1 mg Oral Q8H  . darbepoetin (ARANESP) injection -  DIALYSIS  200 mcg Intravenous Q Thu-HD  . doxercalciferol  1 mcg Intravenous Q T,Th,Sa-HD  . feeding supplement (NEPRO CARB STEADY)  237 mL Oral BID BM  . haloperidol  0.5 mg Oral QHS  . hydrocortisone   Rectal BID  . insulin aspart  0-15 Units Subcutaneous TID WC  . insulin aspart  0-5 Units Subcutaneous QHS  . insulin glargine  13 Units Subcutaneous Daily  . LORazepam  2 mg Intravenous Q T,Th,Sa-HD  . multivitamin  1 tablet Oral QHS  . pantoprazole  20 mg Oral Daily  . sertraline  50 mg Oral Daily  . sodium chloride flush  3 mL Intravenous Q12H  . sodium chloride flush  3 mL Intravenous Q12H  . traZODone  25 mg Oral QHS     Dialysis Orders:[facility] T,TH,S 3.5 hrs 63.5 kg 400/800  2.0 K/2.25 Ca UF profile 2 -Heparin: NONE -Mircera 200 mcg IV q 2 weeks (last dose 150 mcg IV 01/07/17) -Venofer 50 mg IV weekly (last dose 01/11/2017 Last Fe 36 Ferritin 876 Tsat 18% 12/26/16-rec'd venofer 100 mg IV X 8 doses) -Hectorol 2 mcg IV TIW (Last PTH 593 12/26/16)   BMD meds:  Calcium Acetate 667 mg 2  capsules TID AC  Renvela 800 mg 3 tabs PO TID AC   Assessment/Plan: 1. L. Thigh abcess vs hematoma. MRI showed fluid collection-has 400cc aspirated 06/16 without improvement of sx.  Surgery consulted however has high uric acid level. Dr. Sharol Given wants to give trial of colchicine/allupurinol prior to surgical intervention. BC NGTD.ABX dc'd. Per primary.  2. ESRD -T,Th, S. Next HD today on schedule. K+ 3.6 Use 4.0 K bath.  3. Anemia - Jehovah witness/does not accept blood transfusions. HGB 7.9 06/20 now 6.6 today. Redraw on HD.Getting ESA/Fe load.  4. Secondary hyperparathyroidism - Add renal function to today's labs. Ca 8.7 C Ca 10  Phos 4.3. Continue binders.  5. HTN/volume - Last HD 01/21/17 Pre wt 63.4 kg Net UF 1.0 liter Post wt 62.7 kg. Now slightly under EDW. 1-1.5 liters UFG with HD today Adjust EDW on DC.  6. Nutrition - Albumin 2.5. Renal diet/renal vits. Added prostat.  7. DM: per primary 8. H/O Bipolar/polysubstance abuse: Psych meds per primary   Evianna Chandran H. Marke Goodwyn NP-C 01/23/2017, 9:15 AM  [employer] [phone]

## 2017-01-23 NOTE — Progress Notes (Signed)
Patient arrived to unit per bed.  Reviewed treatment plan and this RN agrees.  Report received from bedside RN, Anisha.  Consent verified.  Patient A & O  X 4. Lung sounds diminished to ausculation in all fields. No edema. Cardiac: NSR.  Prepped LLAVF with alcohol and cannulated with two 15 gauge needles.  Pulsation of blood noted.  Flushed access well with saline per protocol.  Connected and secured lines and initiated tx at 1511.  UF goal of 2500 mL and net fluid removal of 2000 mL.  Will continue to monitor.

## 2017-01-23 NOTE — Progress Notes (Signed)
Lab called with critical HGB 6.6. 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 Will page attending prior to leaving.

## 2017-01-23 NOTE — Progress Notes (Signed)
PROGRESS NOTE    [name]  QBH:[id number] DOB: [birth year] DOA: 01/16/2017 PCP: Patient, [name]  Outpatient Specialists:     Brief Narrative:   50 ? ESRD TTS [place] Witness-does not get blood transfusion Systolic/diastolic heart failure last EF 40% DM TY2 with complications of neuropathy Bipolar + schizoaffective Chronic hepatitis C Chronic pain syndrome complicated by  Polysubstance marijuana/cocaine/street drugs? 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 Documented medical noncompliance   Admitted 6/14 with painful swollen left leg and dark stool in the setting of taking Goody powders Eventually found to have a large abscess in the left leg-started on vancomycin and Maxipime and and Protonix on admission- Hemoglobin on admit 6   Assessment & Plan:   Principal Problem:   Cellulitis of left leg Active Problems:   Anemia of chronic kidney failure   Chronic pain syndrome   ESRD (end stage renal disease) ([place])   Diabetes mellitus with complication ([place])   Upper GI bleed   External hemorrhoids   Diverticulosis of colon without hemorrhage   Goals of care, counseling/discussion   Palliative care encounter   Left leg cellulitis-possible that this could represent gout? -MRI 6/14 left femur showed fluid collection 400cc, underwent IR aspiration on 6/16, with return of only 30 mL of fluid aspirate.  On IV antibiotics, vanco, maxipime since 6/14--no fever spike.  Abx d/c 6/20 , no fever since then  Dr. Maylene Roes Consult general surgery-edition Dr. Sharol Given consulted 6/18  Uric acid 7-started colchicine 0.6 bid, Allopurinol 100 TTS --!significant improvement noted to left thigh!  Patient is worried that there is still a bring infection and I tried to educate her regarding gout.  I have asked RN to provde eduation to p[atient re: gout Blood cultures NGTD  Pain control, antiemetic for nausea   Blood loss anemia, normocytic Lower GI bleed  secondary to external hemorrhoids  No blood transfusions due to being Jehovah's Witness Stop all aspirin, ibuprofen Hemoglobin ~6 to 7 S/p flex sig 6/15 found external hemorrhoids, source of bleed, not suitable to band, no sign of blood in colon  Hemoglobin being managed by nephrology with injections of Nulecit and Aranesp--might need further Nulecit  ESRD TThS Hypovolemic hyponatremia Secondary hyperparathyroidism Nephrology for dialysis   Type 2 diabetes, with hyperglycemia Lantus 13 U daily Sliding scale insulin, sugars 137-207  Essential hypertension. -Continue Norvasc 10 , Coreg 25 bid, catapres 0.1 q8 better controlled as less pain  Hyperlipidemia -Continue Lipitor  Depression/anxiety Chronic pain with polysubstance abuse  Noncompliance with medical therapy  Continue Zoloft, Ativan escalated narcotics from every 6 when necessary 2 q4-->q6prn  Left plantar stump wound -Consult wound nurse, dressing change recommendations   SCDs Inpatient Appreciate Ortho input Patient refusing therapy services and wishes to be independent  Consultants:   General surgery  Nephrology  Orthopedics   Procedures:   Attempt at I&D of 400 cm abscess versus hematoma   Antimicrobials:   Cefepime  Vancomycin   Subjective:  taNGENTIAL ASKING FOR SPECIFIC oXY iR NO ORTHER ISSUE fEELS PAIN IS WORSE--lAYING COMFORTABLE   Objective: Vitals:   01/22/17 1835 01/22/17 2111 01/23/17 0448 01/23/17 0829  BP: (!) 163/66 138/73 (!) 149/56 (!) 135/39  Pulse: 76 78 76 70  Resp: 18 17 17 17   Temp: 98.9 F (37.2 C) 98.3 F (36.8 C) 98.9 F (37.2 C) 97.9 F (36.6 C)  TempSrc: Oral Oral Oral Oral  SpO2: 98% 100% 99% 100%  Weight:      Height:  Intake/Output Summary (Last 24 hours) at 01/23/17 1450 Last data filed at 01/23/17 1338  Gross per 24 hour  Intake             1203 ml  Output              240 ml  Net              963 ml   Filed Weights   01/21/17  0704 01/21/17 1118 01/21/17 2031  Weight: 63.4 kg (139 lb 12.4 oz) 62.7 kg (138 lb 3.7 oz) 62.6 kg (138 lb)    Examination:  Alert oriented EOMI NCAT Chest is clinically clear Abdomen soft nontender nondistended Patient has a left Lis-Franc amputation --mass LESS swollen SIZE decreased   Data Reviewed: I have personally reviewed following labs and imaging studies  CBC:  Recent Labs Lab 01/19/17 0915 01/20/17 0614 01/21/17 0602 01/21/17 0700 01/22/17 0454 01/23/17 0430  WBC 8.3 6.4 6.3 6.8 6.7 6.6  NEUTROABS 6.9 4.9 4.6  --  4.6 4.0  HGB 6.7* 7.0* 7.1* 6.8* 7.9* 6.6*  HCT 22.6* 22.8* 22.6* 21.8* 26.6* 21.8*  MCV 92.6 92.3 89.7 88.6 94.3 92.4  PLT 294 287 282 274 315 756   Basic Metabolic Panel:  Recent Labs Lab 01/16/17 1452  01/20/17 0614 01/21/17 0602 01/21/17 0700 01/22/17 0454 01/23/17 0430  NA 128*  < > 126* 126* 125* 132*  132* 127*  K 4.6  < > 4.3 4.1 4.1 3.7  3.6 3.9  CL 92*  < > 89* 87* 87* 95*  95* 92*  CO2 26  < > 25 27 27 28  28 22   GLUCOSE 193*  < > 224* 103* 110* 148*  153* 219*  BUN 30*  < > 26* 34* 35* 17  18 28*  CREATININE 4.40*  < > 5.40* 6.63* 6.75* 4.71*  4.51* 6.03*  CALCIUM 8.2*  < > 8.6* 8.5* 8.4* 8.7*  8.6* 8.0*  PHOS 2.7  --   --   --  4.6 4.3 4.3  < > = values in this interval not displayed. GFR: Estimated Creatinine Clearance: 9.6 mL/min (A) (by C-G formula based on SCr of 6.03 mg/dL (H)). Liver Function Tests:  Recent Labs Lab 01/16/17 1452 01/21/17 0700 01/22/17 0454 01/23/17 0430  ALBUMIN 2.3* 2.3* 2.5* 2.1*   No results for input(s): LIPASE, AMYLASE in the last 168 hours. No results for input(s): AMMONIA in the last 168 hours. Coagulation Profile: No results for input(s): INR, PROTIME in the last 168 hours. Cardiac Enzymes: No results for input(s): CKTOTAL, CKMB, CKMBINDEX, TROPONINI in the last 168 hours. BNP (last 3 results) No results for input(s): PROBNP in the last 8760 hours. HbA1C: No results for  input(s): HGBA1C in the last 72 hours. CBG:  Recent Labs Lab 01/22/17 1214 01/22/17 1605 01/22/17 2120 01/23/17 0803 01/23/17 1147  GLUCAP 198* 189* 160* 207* 137*   Lipid Profile: No results for input(s): CHOL, HDL, LDLCALC, TRIG, CHOLHDL, LDLDIRECT in the last 72 hours. Thyroid Function Tests: No results for input(s): TSH, T4TOTAL, FREET4, T3FREE, THYROIDAB in the last 72 hours. Anemia Panel: No results for input(s): VITAMINB12, FOLATE, FERRITIN, TIBC, IRON, RETICCTPCT in the last 72 hours. Urine analysis:    Component Value Date/Time   COLORURINE YELLOW 01/01/2017 0054   APPEARANCEUR HAZY (A) 01/01/2017 0054   LABSPEC 1.013 01/01/2017 0054   PHURINE 8.0 01/01/2017 0054   GLUCOSEU >=500 (A) 01/01/2017 0054   HGBUR SMALL (A) 01/01/2017 0054   BILIRUBINUR  NEGATIVE 01/01/2017 0054   BILIRUBINUR neg 12/13/2014 1127   [place] 01/01/2017 0054   PROTEINUR >=300 (A) 01/01/2017 0054   UROBILINOGEN 0.2 05/01/2015 1252   NITRITE NEGATIVE 01/01/2017 0054   LEUKOCYTESUR NEGATIVE 01/01/2017 0054   Sepsis Labs: @LABRCNTIP (procalcitonin:4,lacticidven:4)  ) Recent Results (from the past 240 hour(s))  Culture, blood (Routine X 2) w Reflex to ID Panel     Status: None   Collection Time: 01/16/17 10:00 AM  Result Value Ref Range Status   Specimen Description BLOOD RIGHT ANTECUBITAL  Final   Special Requests   Final    BOTTLES DRAWN AEROBIC AND ANAEROBIC Blood Culture adequate volume   Culture NO GROWTH 5 DAYS  Final   Report Status 01/21/2017 FINAL  Final  Culture, blood (Routine X 2) w Reflex to ID Panel     Status: None   Collection Time: 01/16/17 10:10 AM  Result Value Ref Range Status   Specimen Description BLOOD RIGHT HAND  Final   Special Requests   Final    BOTTLES DRAWN AEROBIC ONLY Blood Culture adequate volume   Culture NO GROWTH 5 DAYS  Final   Report Status 01/21/2017 FINAL  Final  Culture, blood (routine x 2)     Status: None (Preliminary result)    Collection Time: 01/18/17  9:05 AM  Result Value Ref Range Status   Specimen Description BLOOD HEMODIALYSIS FISTULA  Final   Special Requests   Final    BOTTLES DRAWN AEROBIC AND ANAEROBIC Blood Culture results may not be optimal due to an excessive volume of blood received in culture bottles   Culture NO GROWTH 4 DAYS  Final   Report Status PENDING  Incomplete  Culture, blood (routine x 2)     Status: None (Preliminary result)   Collection Time: 01/18/17  9:20 AM  Result Value Ref Range Status   Specimen Description BLOOD HEMODIALYSIS FISTULA  Final   Special Requests   Final    BOTTLES DRAWN AEROBIC AND ANAEROBIC Blood Culture results may not be optimal due to an excessive volume of blood received in culture bottles   Culture NO GROWTH 4 DAYS  Final   Report Status PENDING  Incomplete  Aerobic/Anaerobic Culture (surgical/deep wound)     Status: None   Collection Time: 01/18/17  4:03 PM  Result Value Ref Range Status   Specimen Description ABSCESS THIGH  Final   Special Requests NONE  Final   Gram Stain   Final    MODERATE WBC PRESENT, PREDOMINANTLY PMN NO ORGANISMS SEEN    Culture No growth aerobically or anaerobically.  Final   Report Status 01/23/2017 FINAL  Final         Radiology Studies: No results found.      Scheduled Meds: . allopurinol  100 mg Oral Q T,Th,Sat-1800  . amLODipine  10 mg Oral QHS  . atorvastatin  40 mg Oral QHS  . calcium acetate  1,334 mg Oral TID WC  . carvedilol  25 mg Oral BID WC  . cloNIDine  0.1 mg Oral Q8H  . darbepoetin (ARANESP) injection - DIALYSIS  200 mcg Intravenous Q Thu-HD  . doxercalciferol  1 mcg Intravenous Q T,Th,Sa-HD  . feeding supplement (NEPRO CARB STEADY)  237 mL Oral BID BM  . haloperidol  0.5 mg Oral QHS  . hydrocortisone   Rectal BID  . insulin aspart  0-15 Units Subcutaneous TID WC  . insulin aspart  0-5 Units Subcutaneous QHS  . insulin glargine  13 Units  Subcutaneous Daily  . LORazepam  2 mg Intravenous Q  T,Th,Sa-HD  . multivitamin  1 tablet Oral QHS  . pantoprazole  20 mg Oral Daily  . sertraline  50 mg Oral Daily  . sodium chloride flush  3 mL Intravenous Q12H  . sodium chloride flush  3 mL Intravenous Q12H  . traZODone  25 mg Oral QHS   Continuous Infusions: . sodium chloride    . ferric gluconate (FERRLECIT/NULECIT) IV Stopped (01/21/17 1052)     LOS: 7 days    Time spent: [place], MD [facility]   If 7PM-7AM, please contact night-coverage [email or web address] Password [email or web address] 01/23/2017, 2:50 PM

## 2017-01-23 NOTE — Progress Notes (Signed)
Resting off and on through the night. No s/sx of distress. Pain being managed with po Oxycodone as ordered.

## 2017-01-23 NOTE — Progress Notes (Signed)
Dialysis treatment completed.  2500 mL ultrafiltrated and net fluid removal 2000 mL.    Patient status unchanged. Lung sounds diminished to ausculation in all fields. No edema. Cardiac: NSR.  Disconnected lines and removed needles.  Pressure held for 10 minutes and band aid/gauze dressing applied.  Report given to bedside RN, Anisha.

## 2017-01-24 LAB — GLUCOSE, CAPILLARY
Glucose-Capillary: 182 mg/dL — ABNORMAL HIGH (ref 65–99)
Glucose-Capillary: 218 mg/dL — ABNORMAL HIGH (ref 65–99)
Glucose-Capillary: 146 mg/dL — ABNORMAL HIGH (ref 65–99)

## 2017-01-24 MED ORDER — COLCHICINE 0.6 MG PO TABS: 0.3000 mg | ORAL_TABLET | Freq: Every day | ORAL | 0 refills | Status: DC

## 2017-01-24 MED ORDER — INSULIN GLARGINE 100 UNIT/ML ~~LOC~~ SOLN: 13.0000 [IU] | Freq: Every day | SUBCUTANEOUS | 0 refills | Status: DC

## 2017-01-24 MED ORDER — HYDROCORTISONE 2.5 % RE CREA: TOPICAL_CREAM | Freq: Two times a day (BID) | RECTAL | 0 refills | Status: DC

## 2017-01-24 MED ORDER — OXYCODONE HCL 10 MG PO TABS: 10.0000 mg | ORAL_TABLET | Freq: Four times a day (QID) | ORAL | 0 refills | Status: DC | PRN

## 2017-01-24 MED ORDER — ALLOPURINOL 100 MG PO TABS: 100.0000 mg | ORAL_TABLET | ORAL | 1 refills | Status: DC

## 2017-01-24 NOTE — Progress Notes (Signed)
[name] to be D/C'd Home per MD order.  Discussed prescriptions and follow up appointments with the patient. Prescriptions given to patient, medication list explained in detail. Pt verbalized understanding.  Allergies as of 01/24/2017      Reactions   Penicillins Hives, Other (See Comments)   Has patient had a PCN reaction causing immediate rash, facial/tongue/throat swelling, SOB or lightheadedness with hypotension: Yes Has patient had a PCN reaction causing severe rash involving mucus membranes or skin necrosis: No Has patient had a PCN reaction that required hospitalization No Has patient had a PCN reaction occurring within the last 10 years: No If all of the above answers are "NO", then may proceed with Cephalosporin use. Pt tolerates cefepime and ceftriaxone   Gabapentin Itching, Swelling   Lyrica [pregabalin] Swelling, Other (See Comments)   Facial and leg swelling   Saphris [asenapine] Swelling, Other (See Comments)   Facial swelling   Tramadol Swelling, Other (See Comments)   Leg swelling   Other Other (See Comments)   NO BLOOD PRODUCTS. PT IS A JEHOVAH WITNESS      Medication List    STOP taking these medications   doxercalciferol 4 MCG/2ML injection Commonly known as:  HECTOROL   furosemide 80 MG tablet Commonly known as:  LASIX   lidocaine-prilocaine cream Commonly known as:  EMLA     TAKE these medications   albuterol 108 (90 Base) MCG/ACT inhaler Commonly known as:  PROVENTIL HFA;VENTOLIN HFA Inhale 2 puffs into the lungs every 4 (four) hours as needed for wheezing or shortness of breath.   allopurinol 100 MG tablet Commonly known as:  ZYLOPRIM Take 1 tablet (100 mg total) by mouth every Tuesday, Thursday, and Saturday at 6 PM. Start taking on:  01/25/2017   amLODipine 10 MG tablet Commonly known as:  NORVASC Take 1 tablet (10 mg total) by mouth at bedtime.   atorvastatin 40 MG tablet Commonly known as:  LIPITOR Take 1 tablet (40 mg total) by mouth  at bedtime.   calcium acetate 667 MG capsule Commonly known as:  PHOSLO Take 2 capsules (1,334 mg total) by mouth 3 (three) times daily with meals.   carvedilol 25 MG tablet Commonly known as:  COREG Take 1 tablet (25 mg total) by mouth 2 (two) times daily with a meal.   cloNIDine 0.1 MG tablet Commonly known as:  CATAPRES Take 1 tablet (0.1 mg total) by mouth every 8 (eight) hours.   colchicine 0.6 MG tablet Take 0.5 tablets (0.3 mg total) by mouth daily. Start taking on:  02/04/2017   haloperidol 0.5 MG tablet Commonly known as:  HALDOL Take 1 tablet (0.5 mg total) by mouth every 8 (eight) hours as needed for agitation. What changed:  when to take this   hydrocortisone 2.5 % rectal cream Commonly known as:  ANUSOL-HC Place rectally 2 (two) times daily.   insulin aspart 100 UNIT/ML injection Commonly known as:  novoLOG Inject 5 Units into the skin 3 (three) times daily with meals.   insulin glargine 100 UNIT/ML injection Commonly known as:  LANTUS Inject 0.13 mLs (13 Units total) into the skin at bedtime. What changed:  how much to take   LORazepam 2 MG/ML injection Commonly known as:  ATIVAN Inject 1 mL (2 mg total) into the vein Every Tuesday,Thursday,and Saturday with dialysis.   multivitamin Tabs tablet Take 1 tablet by mouth at bedtime.   Oxycodone HCl 10 MG Tabs Take 1 tablet (10 mg total) by mouth every 6 (  six) hours as needed for severe pain.   sertraline 50 MG tablet Commonly known as:  ZOLOFT Take 1 tablet (50 mg total) by mouth daily.   traZODone 50 MG tablet Commonly known as:  DESYREL Take 0.5 tablets (25 mg total) by mouth at bedtime.       Vitals:   01/24/17 0805 01/24/17 1429  BP: (!) 153/63 (!) 126/57  Pulse: 75   Resp: 18   Temp: 99.1 F (37.3 C)     Skin clean, dry and intact without evidence of skin break down, no evidence of skin tears noted. IV catheter discontinued intact. Site without signs and symptoms of complications.  Dressing and pressure applied. Pt denies pain at this time. No complaints noted.  An After Visit Summary was printed and given to the patient. Patient escorted via [place], and D/C home via private auto.  Dixie Dials RN, BSN

## 2017-01-24 NOTE — Progress Notes (Signed)
PT Cancellation Note  Patient Details Name: [name] MRN: [id number] DOB: [birth year]   Cancelled Treatment:    Reason Eval/Treat Not Completed: Patient declined, no reason specified. Pt reports that she is not getting up because she is not feeling well. States that she has no plans to get up with therapy. PT has checked back x 3 with refusals all three times. Pt will sign-off at this time.    Scheryl Marten PT, DPT  [phone]  01/24/2017, 11:54 AM

## 2017-01-24 NOTE — Progress Notes (Signed)
Patient ID: [name], female   DOB: September 15, 1965, 51 y.o.   MRN: [id number] Patient states "I'm not in the pain was in ". Examination of her left thigh there is much less swelling the skin wrinkles the area of fullness is nontender to palpation. She still has a little bit of effusion in the left knee. Patient has responded well. Plan for discharge I will follow-up in the office in 1 week. Pharmacy to dose allopurinol and colchicine prescriptions for discharge

## 2017-01-24 NOTE — Progress Notes (Signed)
[facility] Progress Note   Subjective:  Seen in room. No CP or dyspnea. She understands that her Hgb remains low, but does not want transfusion. Thinks that her L leg is improving. Tells me she is going home today   Objective Vitals:   01/23/17 1915 01/23/17 2100 01/24/17 0436 01/24/17 0805  BP: (!) 159/73 (!) 149/60 (!) 147/60 (!) 153/63  Pulse: 80 77 74 75  Resp:  17 18 18   Temp:  98.2 F (36.8 C) 98.7 F (37.1 C) 99.1 F (37.3 C)  TempSrc:  Oral Oral Oral  SpO2:  92% 93% 93%  Weight:  64.7 kg (142 lb 10.2 oz)    Height:       Physical Exam General: Well appearing, NAD. Heart: RRR; no murmur. Lungs: CTAB Extremities: No LE edema, L thigh "bump" smaller Dialysis Access: R forearm AVG + bruit  Additional Objective Labs: Basic Metabolic Panel:  Recent Labs Lab 01/21/17 0700 01/22/17 0454 01/23/17 0430  NA 125* 132*  132* 127*  K 4.1 3.7  3.6 3.9  CL 87* 95*  95* 92*  CO2 27 28  28 22   GLUCOSE 110* 148*  153* 219*  BUN 35* 17  18 28*  CREATININE 6.75* 4.71*  4.51* 6.03*  CALCIUM 8.4* 8.7*  8.6* 8.0*  PHOS 4.6 4.3 4.3   Liver Function Tests:  Recent Labs Lab 01/21/17 0700 01/22/17 0454 01/23/17 0430  ALBUMIN 2.3* 2.5* 2.1*   CBC:  Recent Labs Lab 01/21/17 0602 01/21/17 0700 01/22/17 0454 01/23/17 0430 01/23/17 1517  WBC 6.3 6.8 6.7 6.6 7.2  NEUTROABS 4.6  --  4.6 4.0  --   HGB 7.1* 6.8* 7.9* 6.6* 6.5*  HCT 22.6* 21.8* 26.6* 21.8* 21.2*  MCV 89.7 88.6 94.3 92.4 90.6  PLT 282 274 315 245 216   Blood Culture    Component Value Date/Time   SDES ABSCESS THIGH 01/18/2017 1603   SPECREQUEST NONE 01/18/2017 1603   CULT No growth aerobically or anaerobically. 01/18/2017 1603   REPTSTATUS 01/23/2017 FINAL 01/18/2017 1603   CBG:  Recent Labs Lab 01/22/17 2120 01/23/17 0803 01/23/17 1147 01/23/17 2135 01/24/17 0806  GLUCAP 160* 207* 137* 117* 182*   Medications: . sodium chloride    . ferric gluconate  (FERRLECIT/NULECIT) IV 125 mg (01/23/17 1739)   . allopurinol  100 mg Oral Q T,Th,Sat-1800  . amLODipine  10 mg Oral QHS  . atorvastatin  40 mg Oral QHS  . calcium acetate  1,334 mg Oral TID WC  . carvedilol  25 mg Oral BID WC  . cloNIDine  0.1 mg Oral Q8H  . darbepoetin (ARANESP) injection - DIALYSIS  200 mcg Intravenous Q Thu-HD  . doxercalciferol  1 mcg Intravenous Q T,Th,Sa-HD  . feeding supplement (NEPRO CARB STEADY)  237 mL Oral BID BM  . haloperidol  0.5 mg Oral QHS  . hydrocortisone   Rectal BID  . insulin aspart  0-15 Units Subcutaneous TID WC  . insulin aspart  0-5 Units Subcutaneous QHS  . insulin glargine  13 Units Subcutaneous Daily  . LORazepam  2 mg Intravenous Q T,Th,Sa-HD  . multivitamin  1 tablet Oral QHS  . pantoprazole  20 mg Oral Daily  . sertraline  50 mg Oral Daily  . sodium chloride flush  3 mL Intravenous Q12H  . sodium chloride flush  3 mL Intravenous Q12H  . traZODone  25 mg Oral QHS    Dialysis Orders: [place] T,TH,S 3.5 hrs 63.5 kg 400/800, 2.0  K/2.25 Ca UF profile 2 -Heparin: NONE -Mircera 200 mcg IV q 2 weeks (last dose 150 mcg IV 01/07/17) -Venofer 50 mg IV weekly (last dose 01/11/2017 Last Fe 36 Ferritin 876 Tsat 18% 12/26/16-rec'd venofer 100 mg IV X 8 doses) -Hectorol 2 mcg IV TIW (Last PTH 593 12/26/16)   BMD meds:  Calcium Acetate 667 mg 2 capsules TID AC  Renvela 800 mg 3 tabs PO TID AC  Assessment/Plan: 1. L. Thigh abcess vs hematoma: MRI showed fluid collection-has 400cc aspirated 06/16 without improvement of sx.  Surgery consulted however has high uric acid level. Dr. Sharol Given wants to give trial of colchicine/allupurinol prior to surgical intervention. BC NGTD. ABX dc'd. Per primary.  2. ESRD: Continue TTS schedule, next 6/23. If goes home can get at [facility] unit- if here will do here 3. Anemia: Hgb 6.5. Jehovah witness/does not accept blood transfusions. On Aranesp 227mcg weekly (given 6/21), and IV iron with HD. 4. Secondary  hyperparathyroidism: Corr Ca/Phos ok. Continue Phoslo as binder. 5. HTN/volume: BP slightly high, will keep same EDW for now. 6. Nutrition: Albumin 2.1. Continue Nepro, add pro-stat. 7. DM: per primary 8. H/O Bipolar/polysubstance abuse: Psych meds per primary.  Veneta Penton, PA-C 01/24/2017, 9:20 AM  [employer] Pager: [phone]  Patient seen and examined, agree with above note with above modifications. Patient pleasant- thinks she is doing better, tells me she is going home today - refusing blood products- max ESA- HD tomorrow either here or at OP unit  Corliss Parish, MD 01/24/2017

## 2017-01-24 NOTE — Discharge Summary (Signed)
Physician Discharge Summary  [name] ENI:[id number] DOB: Dec 11, 1965 DOA: 01/16/2017  PCP: Maurice Small, MD  Admit date: 01/16/2017 Discharge date: 01/24/2017  Time spent: 45 minutes  Recommendations for Outpatient Follow-up:  1. Patient discharged with 0.3 mg of colchicine for treatment of gout in left anterior thigh muscles-this needs to be carefully dosed for flare every 2 weeks and I've given her only 2 tablets ending 03/05/2007 for treatment of this flare and then maintenance dose will have to be decided by her nephrologist 2. Patient needs close follow-up with Dr. Sharol Given 3. Would recommend iron, iron studies and ferritin at next dialysis session as patient is a Jehovah's Witness and DOES not get transfusions 4. 6 tab oxycodone prescribed on discharge ONLY-follow with PCP 5. Lantus dosing has been downward adjusted to 13 units and may need to be adjusted further as an out patient 6. As her volume is managed by dialysis, I discontinued her Lasix 7. She may take Anusol over-the-counter for hemorrhoids   Discharge Diagnoses:  Principal Problem:   Cellulitis of left leg Active Problems:   Anemia of chronic kidney failure   Chronic pain syndrome   ESRD (end stage renal disease) ([place])   Diabetes mellitus with complication (HCC)   Upper GI bleed   External hemorrhoids   Diverticulosis of colon without hemorrhage   Goals of care, counseling/discussion   Palliative care encounter   Discharge Condition: Improved and stabilized  Diet recommendation: Renal, diabetic, fluid restricted  Filed Weights   01/23/17 1506 01/23/17 1911 01/23/17 2100  Weight: 66.7 kg (147 lb 0.8 oz) 64.7 kg (142 lb 10.2 oz) 64.7 kg (142 lb 10.2 oz)    History of present illness:  50 ? ESRD TTS [place] Witness-does not get blood transfusion Systolic/diastolic heart failure last EF 40% DM TY2 with complications of neuropathy Bipolar + schizoaffective Chronic hepatitis C Chronic pain  syndrome complicated by             Polysubstance marijuana/cocaine/street drugs? 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             On IV antibiotics, vanco, maxipime since 6/14--no fever spike.  Abx d/c 6/20 , no fever since then             Dr. Maylene Roes Consult general and I spoke to Dr. Sharol Given  6/18             Uric acid 7-started colchicine 0.6, Allopurinol 100 TTS --!significant improvement noted to left thigh!             Patient given adequate education regarding gout and follow-up required Blood cultures NGTD  Pain control, antiemetic for nausea   Blood loss anemia, normocytic Lower GI bleed secondary to external hemorrhoids  No blood transfusions due to being Jehovah's Witness Stop all aspirin, ibuprofen, no more Goody powders Hemoglobin ~6 to 7 S/p flex sig 6/15 found external hemorrhoids, source of bleed, not suitable to band, no sign of blood in colon  Hemoglobin being managed by nephrology with injections of Nulecit and Aranesp--might need further Nulecit  ESRD TThS Hypovolemic hyponatremia Secondary hyperparathyroidism Nephrology for dialysis   Type 2 diabetes, with hyperglycemia Lantus 13 U daily Sliding scale insulin, sugars 137-207  Essential hypertension. -Continue Norvasc 10 , Coreg 25 bid, catapres 0.1 q8 better controlled as less pain  Hyperlipidemia -Continue Lipitor  Depression/anxiety Chronic pain with  polysubstance abuse  Noncompliance with medical therapy  Continue Zoloft, Ativan escalated narcotics from every 6 when necessary 2 q4-->q6prn She is aware she will not be prescribed>  6 tabs of narcotics  Left plantar stump wound -Consult wound nurse, dressing change recommendations   Procedures:  MRI foot and leg  Aspiration attempt 6/16   Consultations:  General surgery  Orthopedics  Discharge Exam: Vitals:   01/23/17 2100 01/24/17 0436  BP: (!) 149/60 (!) 147/60  Pulse: 77 74  Resp: 17 18  Temp: 98.2 F (36.8 C) 98.7 F (37.1 C)    General: eomin ncat Cardiovascular: s1 s 2no m/r/g clear Respiratory: clear no added sound abd soft nt nd no rebound  Discharge Instructions   Discharge Instructions    Diet - low sodium heart healthy    Complete by:  As directed    Discharge instructions    Complete by:  As directed    Take Colchicine next on 02/04/17 this should ONLY be taken every 14 days because you are dialysis patient and will have to be adjusted by your nephrologist when he sees him next Take Allopurinol after every dialysis Notice the changes to insulin dosing and adjust depdning on your blood sugar No narctoics will be prescribed on discharge-follow with primary Md or pain management Please call Dr. Sharol Given for follow up as an OP for the area in your thigh   Increase activity slowly    Complete by:  As directed      Current Discharge Medication List    START taking these medications   Details  allopurinol (ZYLOPRIM) 100 MG tablet Take 1 tablet (100 mg total) by mouth every Tuesday, Thursday, and Saturday at 6 PM. Qty: 60 tablet, Refills: 1    colchicine 0.6 MG tablet Take 0.5 tablets (0.3 mg total) by mouth daily. Qty: 2 tablet, Refills: 0    hydrocortisone (ANUSOL-HC) 2.5 % rectal cream Place rectally 2 (two) times daily. Qty: 30 g, Refills: 0    oxyCODONE 10 MG TABS Take 1 tablet (10 mg total) by mouth every 6 (six) hours as needed for severe  pain. Qty: 6 tablet, Refills: 0      CONTINUE these medications which have CHANGED   Details  insulin glargine (LANTUS) 100 UNIT/ML injection Inject 0.13 mLs (13 Units total) into the skin at bedtime. Qty: 7 mL, Refills: 0      CONTINUE these medications which have NOT CHANGED   Details  albuterol (PROVENTIL HFA;VENTOLIN HFA) 108 (90 Base) MCG/ACT inhaler Inhale 2 puffs into the lungs every 4 (four) hours as needed for wheezing or shortness of breath. Qty: 1 Inhaler, Refills: 0    amLODipine (NORVASC) 10 MG tablet Take 1 tablet (10 mg total) by mouth at bedtime. Qty: 30 tablet, Refills: 0    atorvastatin (LIPITOR) 40 MG tablet Take 1 tablet (40 mg total) by mouth at bedtime. Qty: 30 tablet, Refills: 0    calcium acetate (PHOSLO) 667 MG capsule Take 2 capsules (1,334 mg total) by mouth 3 (three) times daily with meals. Qty: 240 capsule, Refills: 0  carvedilol (COREG) 25 MG tablet Take 1 tablet (25 mg total) by mouth 2 (two) times daily with a meal. Qty: 60 tablet, Refills: 0    cloNIDine (CATAPRES) 0.1 MG tablet Take 1 tablet (0.1 mg total) by mouth every 8 (eight) hours. Qty: 90 tablet, Refills: 11    haloperidol (HALDOL) 0.5 MG tablet Take 1 tablet (0.5 mg total) by mouth every 8 (eight) hours as needed for agitation. Qty: 30 tablet, Refills: 0    insulin aspart (NOVOLOG) 100 UNIT/ML injection Inject 5 Units into the skin 3 (three) times daily with meals. Qty: 5 mL, Refills: 0    multivitamin (RENA-VIT) TABS tablet Take 1 tablet by mouth at bedtime. Qty: 30 tablet, Refills: 0    sertraline (ZOLOFT) 50 MG tablet Take 1 tablet (50 mg total) by mouth daily. Qty: 30 tablet, Refills: 0    traZODone (DESYREL) 50 MG tablet Take 0.5 tablets (25 mg total) by mouth at bedtime. Qty: 30 tablet, Refills: 0    LORazepam (ATIVAN) 2 MG/ML injection Inject 1 mL (2 mg total) into the vein Every Tuesday,Thursday,and Saturday with dialysis. Qty: 13 mL, Refills: 0      STOP taking  these medications     furosemide (LASIX) 80 MG tablet      doxercalciferol (HECTOROL) 4 MCG/2ML injection      lidocaine-prilocaine (EMLA) cream        Allergies  Allergen Reactions  . Penicillins Hives and Other (See Comments)    Has patient had a PCN reaction causing immediate rash, facial/tongue/throat swelling, SOB or lightheadedness with hypotension: Yes Has patient had a PCN reaction causing severe rash involving mucus membranes or skin necrosis: No Has patient had a PCN reaction that required hospitalization No Has patient had a PCN reaction occurring within the last 10 years: No If all of the above answers are "NO", then may proceed with Cephalosporin use. Pt tolerates cefepime and ceftriaxone  . Gabapentin Itching and Swelling  . Lyrica [Pregabalin] Swelling and Other (See Comments)    Facial and leg swelling  . Saphris [Asenapine] Swelling and Other (See Comments)    Facial swelling  . Tramadol Swelling and Other (See Comments)    Leg swelling  . Other Other (See Comments)    NO BLOOD PRODUCTS. PT IS A [employer]EH[employer] WITNESS   Follow-up Information    Newt Minion, MD Follow up in 1 week(s).   Specialty:  Orthopedic Surgery Contact information: [address] [place] [address] [phone]            The results of significant diagnostics from this hospitalization (including imaging, microbiology, ancillary and laboratory) are listed below for reference.    Significant Diagnostic Studies: Dg Chest 2 View  Result Date: 01/20/2017 CLINICAL DATA:  Recent fall with right-sided chest pain, initial encounter EXAM: CHEST  2 VIEW COMPARISON:  12/31/2016 FINDINGS: Cardiac shadow remains enlarged. Vascular stent is noted in the left arm. The lungs are clear bilaterally. No focal infiltrate or sizable effusion is seen. Previously noted changes have resolved in the interval. No sizable effusion is seen. The bony structures show no focal abnormality. IMPRESSION:  No active cardiopulmonary disease. Electronically Signed   By: Inez Catalina M.D.   On: 01/20/2017 16:00   [place] Abscess Drain  Result Date: 01/18/2017 INDICATION: 51 year old female presents with left thigh myositis and possible abscess EXAM: ULTRASOUND GUIDED ABSCESS DRAINAGE MEDICATIONS: The patient is currently admitted to the hospital and receiving intravenous antibiotics. The antibiotics were administered within  an appropriate time frame prior to the initiation of the procedure. ANESTHESIA/SEDATION: None COMPLICATIONS: None PROCEDURE: Informed written consent was obtained from the patient after a thorough discussion of the procedural risks, benefits and alternatives. All questions were addressed. Maximal Sterile Barrier Technique was utilized including caps, mask, sterile gowns, sterile gloves, sterile drape, hand hygiene and skin antiseptic. A timeout was performed prior to the initiation of the procedure. Patient positioned supine position on the ultrasound stretcher. Ultrasound images of the left thigh performed with images stored and sent to PACs. The patient was then prepped and draped in the usual sterile fashion. The skin and subcutaneous tissue were generously infiltrated 1% lidocaine for local anesthesia. Small stab incision was made with 11 blade scalpel. Using ultrasound guidance, a 10 French drain was advanced into anechoic fluid collection lateral to the knee in the distal thigh. Drain placed with trocar technique, an once the drain was in place approximately 30 cc of complex yellow fluid was aspirated. Catheter was removed at the completion. Sample was sent for culture. Ultrasound guidance was then used to in attempt to place a yueh needle/catheter in a more complex structure of the distal thigh. No fluid returned. Sterile dressings were placed. Patient tolerated the procedure well and remained hemodynamically stable throughout. No complications encountered and no significant blood loss.  IMPRESSION: Status post ultrasound-guided aspiration of anechoic fluid at the distal thigh with return of approximately 30 cc of complex yellow fluid. Attempted aspiration of more complex fluid of the distal thigh unsuccessful, potentially remains as phlegmon. If the fluid becomes more liquified re- drainage may be considered, or alternatively, incision and drainage. Signed, Dulcy Fanny. Earleen Newport, DO [facility] Electronically Signed   By: Corrie Mckusick D.O.   On: 01/18/2017 15:47   Mr Femur Left Wo Contrast  Result Date: 01/16/2017 CLINICAL DATA:  Left leg mass over the past 2 weeks. Partial amputation of the left foot. EXAM: MR OF THE LEFT FEMUR WITHOUT CONTRAST TECHNIQUE: Multiplanar, multisequence MR imaging of the left upper leg/femur was performed. No intravenous contrast was administered. COMPARISON:  01/01/2017 FINDINGS: Despite efforts by the technologist and patient, motion artifact is present on today's exam and could not be eliminated. This reduces exam sensitivity and specificity. Bones/Joint/Cartilage No significant abnormal osseous edema. Moderate left and trace right knee joint effusion. Ligaments N/A Muscles and Tendons Extensive abnormal edema in the anterior compartmental musculature of the upper leg, with new lower progressive heterogeneous but high T2 signal intensity fluid collection primarily tracking within along the vastus lateralis and vastus intermedius muscles, measuring 7.2 by 5.6 by 18.9 cm (volume = 400 cm^3). This could represent abscess or hematoma. There is some disruption of portions of the vastus lateralis, and edema signal is again identified in all anterior compartmental musculature. There is also some edema tracking within and around the pectineus and adductor longus muscles. Soft tissues Subcutaneous edema in the upper thighs, left greater than right. IMPRESSION: 1. Abnormal extensive edema in the anterior compartment of  the left upper leg, with some mild involvement of the medial compartment. Compartment syndrome and infection not excluded. Compared to the prior exam there has been progressive enlargement of a fluid collection primarily involving the vastus lateralis and vastus intermedius, current volume 400 cubic cm, potentially abscess or hematoma. 2. Moderate left and trace right knee effusions. Electronically Signed   By: Van Clines M.D.   On: 01/16/2017 14:48   Mr Femur Left Wo Contrast  Result Date: 01/01/2017  CLINICAL DATA:  Increasing left thigh pain and swelling for 2 days in patient with a history of end-stage renal disease and drug abuse. EXAM: MR OF THE LEFT FEMUR WITHOUT CONTRAST TECHNIQUE: Multiplanar, multisequence MR imaging of the left upper leg was performed. No intravenous contrast was administered. COMPARISON:  None. FINDINGS: Bones/Joint/Cartilage The axial and coronal sequences incidentally include the right upper leg. Bone marrow signal is normal without fracture, stress change or evidence of osteomyelitis. No avascular necrosis of the femoral heads is identified. Ligaments Negative. Muscles and Tendons Intense edema is present throughout the musculature of the anterior compartment of left upper leg. No intramuscular fluid collection is identified. Much milder degree of edema is seen in the distal right vastus lateralis and intermedius. No muscle or tendon tear or strain is identified. Soft tissues Bilateral upper leg subcutaneous edema is much worse on the left. Small bilateral knee joint effusions are identified. IMPRESSION: Edema throughout the anterior compartment musculature of the left thigh most worrisome for rhabdomyolysis. Compartment syndrome and infectious myositis far possible but thought less likely. Much milder degree of edema is present in the right vastus lateralis and intermedius. Negative for abscess or osteomyelitis. Electronically Signed   By: Inge Rise M.D.   On:  01/01/2017 08:18   Dg Chest Portable 1 View  Result Date: 12/31/2016 CLINICAL DATA:  Acute onset of hypoxia.  Initial encounter. EXAM: PORTABLE CHEST 1 VIEW COMPARISON:  Chest radiograph performed 10/15/2016 FINDINGS: The lungs are well-aerated. Vascular congestion is noted. Hazy right basilar airspace opacity may reflect mild interstitial edema or possibly pneumonia. There is no evidence of pleural effusion or pneumothorax. The cardiomediastinal silhouette is mildly enlarged. No acute osseous abnormalities are seen. IMPRESSION: Vascular congestion and mild cardiomegaly. Hazy right basilar airspace opacity may reflect mild interstitial edema or possibly pneumonia. Electronically Signed   By: Garald Balding M.D.   On: 12/31/2016 23:46   [place] Soft Tissue Non Vascular  Result Date: 01/01/2017 CLINICAL DATA:  Assess left thigh cellulitis.  Initial encounter. EXAM: ULTRASOUND LEFT LOWER EXTREMITY LIMITED TECHNIQUE: Ultrasound examination of the lower extremity soft tissues was performed in the area of clinical concern. COMPARISON:  None. FINDINGS: Joint Space: Not assessed. Muscles: There is vague edema about the superficial musculature. Tendons: Not assessed. Other Soft Tissue Structures: Mild soft tissue edema is noted tracking about the fat overlying the musculature of the left lower thigh. There is no evidence of hyperemia. There is no evidence of abscess. IMPRESSION: Mild soft tissue edema noted, tracking to the superficial musculature of the left lower thigh. No evidence of abscess or hyperemia. Electronically Signed   By: Garald Balding M.D.   On: 01/01/2017 02:18   Dg Femur Min 2 Views Left  Result Date: 01/16/2017 CLINICAL DATA:  Pain, soft tissue mass, ongoing for 2 weeks EXAM: LEFT FEMUR 2 VIEWS COMPARISON:  None. FINDINGS: No fracture or dislocation. No aggressive lytic or sclerotic osseous lesion. Peripheral vascular atherosclerotic disease. No focal soft tissue abnormality.  IMPRESSION: No acute osseous injury of the left femur. If there is persisting concern regarding soft tissue abnormality recommend evaluation with MRI. Electronically Signed   By: Kathreen Devoid   On: 01/16/2017 10:40   Dg Femur Min 2 Views Left  Result Date: 12/31/2016 CLINICAL DATA:  Initial evaluation for recent fall, pain. EXAM: LEFT FEMUR 2 VIEWS COMPARISON:  None. FINDINGS: No acute fracture or dislocation. Limited views of the hip and knee are grossly unremarkable. No acute soft tissue  abnormality. Prominent vascular calcifications noted. IMPRESSION: 1. No acute fracture or dislocation. 2. Prominent soft tissue atherosclerotic changes for patient age. Electronically Signed   By: Jeannine Boga M.D.   On: 12/31/2016 19:02    Microbiology: Recent Results (from the past 240 hour(s))  Culture, blood (Routine X 2) w Reflex to ID Panel     Status: None   Collection Time: 01/16/17 10:00 AM  Result Value Ref Range Status   Specimen Description BLOOD RIGHT ANTECUBITAL  Final   Special Requests   Final    BOTTLES DRAWN AEROBIC AND ANAEROBIC Blood Culture adequate volume   Culture NO GROWTH 5 DAYS  Final   Report Status 01/21/2017 FINAL  Final  Culture, blood (Routine X 2) w Reflex to ID Panel     Status: None   Collection Time: 01/16/17 10:10 AM  Result Value Ref Range Status   Specimen Description BLOOD RIGHT HAND  Final   Special Requests   Final    BOTTLES DRAWN AEROBIC ONLY Blood Culture adequate volume   Culture NO GROWTH 5 DAYS  Final   Report Status 01/21/2017 FINAL  Final  Culture, blood (routine x 2)     Status: None   Collection Time: 01/18/17  9:05 AM  Result Value Ref Range Status   Specimen Description BLOOD HEMODIALYSIS FISTULA  Final   Special Requests   Final    BOTTLES DRAWN AEROBIC AND ANAEROBIC Blood Culture results may not be optimal due to an excessive volume of blood received in culture bottles   Culture NO GROWTH 5 DAYS  Final   Report Status 01/23/2017 FINAL   Final  Culture, blood (routine x 2)     Status: None   Collection Time: 01/18/17  9:20 AM  Result Value Ref Range Status   Specimen Description BLOOD HEMODIALYSIS FISTULA  Final   Special Requests   Final    BOTTLES DRAWN AEROBIC AND ANAEROBIC Blood Culture results may not be optimal due to an excessive volume of blood received in culture bottles   Culture NO GROWTH 5 DAYS  Final   Report Status 01/23/2017 FINAL  Final  Aerobic/Anaerobic Culture (surgical/deep wound)     Status: None   Collection Time: 01/18/17  4:03 PM  Result Value Ref Range Status   Specimen Description ABSCESS THIGH  Final   Special Requests NONE  Final   Gram Stain   Final    MODERATE WBC PRESENT, PREDOMINANTLY PMN NO ORGANISMS SEEN    Culture No growth aerobically or anaerobically.  Final   Report Status 01/23/2017 FINAL  Final     Labs: Basic Metabolic Panel:  Recent Labs Lab 01/20/17 0614 01/21/17 0602 01/21/17 0700 01/22/17 0454 01/23/17 0430  NA 126* 126* 125* 132*  132* 127*  K 4.3 4.1 4.1 3.7  3.6 3.9  CL 89* 87* 87* 95*  95* 92*  CO2 25 27 27 28  28 22   GLUCOSE 224* 103* 110* 148*  153* 219*  BUN 26* 34* 35* 17  18 28*  CREATININE 5.40* 6.63* 6.75* 4.71*  4.51* 6.03*  CALCIUM 8.6* 8.5* 8.4* 8.7*  8.6* 8.0*  PHOS  --   --  4.6 4.3 4.3   Liver Function Tests:  Recent Labs Lab 01/21/17 0700 01/22/17 0454 01/23/17 0430  ALBUMIN 2.3* 2.5* 2.1*   No results for input(s): LIPASE, AMYLASE in the last 168 hours. No results for input(s): AMMONIA in the last 168 hours. CBC:  Recent Labs Lab 01/19/17 0915 01/20/17 2993  01/21/17 0602 01/21/17 0700 01/22/17 0454 01/23/17 0430 01/23/17 1517  WBC 8.3 6.4 6.3 6.8 6.7 6.6 7.2  NEUTROABS 6.9 4.9 4.6  --  4.6 4.0  --   HGB 6.7* 7.0* 7.1* 6.8* 7.9* 6.6* 6.5*  HCT 22.6* 22.8* 22.6* 21.8* 26.6* 21.8* 21.2*  MCV 92.6 92.3 89.7 88.6 94.3 92.4 90.6  PLT 294 287 282 274 315 245 216   Cardiac Enzymes: No results for input(s): CKTOTAL,  CKMB, CKMBINDEX, TROPONINI in the last 168 hours. BNP: BNP (last 3 results)  Recent Labs  03/03/16 1228 12/31/16 2350  BNP 1,298.5* 4,207.8*    ProBNP (last 3 results) No results for input(s): PROBNP in the last 8760 hours.  CBG:  Recent Labs Lab 01/22/17 2120 01/23/17 0803 01/23/17 1147 01/23/17 2135 01/24/17 0806  GLUCAP 160* 207* 137* 117* 182*       Signed:  Nita Sells MD   [facility] 01/24/2017, 8:24 AM

## 2017-01-31 ENCOUNTER — Inpatient Hospital Stay (INDEPENDENT_AMBULATORY_CARE_PROVIDER_SITE_OTHER): Payer: Medicare Other | Admitting: Family

## 2017-02-24 ENCOUNTER — Ambulatory Visit (INDEPENDENT_AMBULATORY_CARE_PROVIDER_SITE_OTHER): Payer: Medicare Other | Admitting: Family

## 2017-02-25 ENCOUNTER — Observation Stay (HOSPITAL_COMMUNITY)
Admission: EM | Admit: 2017-02-25 | Discharge: 2017-03-01 | Disposition: A | Discharge status: Home / Self Care | Payer: Medicare Other | Attending: Internal Medicine | Admitting: Internal Medicine

## 2017-02-25 ENCOUNTER — Encounter (HOSPITAL_COMMUNITY): Payer: Self-pay | Admitting: Emergency Medicine

## 2017-02-25 ENCOUNTER — Emergency Department (HOSPITAL_COMMUNITY): Payer: Medicare Other

## 2017-02-25 DIAGNOSIS — Z881 Allergy status to other antibiotic agents status: Secondary | ICD-10-CM | POA: Diagnosis not present

## 2017-02-25 DIAGNOSIS — B1920 Unspecified viral hepatitis C without hepatic coma: Secondary | ICD-10-CM | POA: Insufficient documentation

## 2017-02-25 DIAGNOSIS — M7122 Synovial cyst of popliteal space [Baker], left knee: Secondary | ICD-10-CM

## 2017-02-25 DIAGNOSIS — F419 Anxiety disorder, unspecified: Secondary | ICD-10-CM | POA: Insufficient documentation

## 2017-02-25 DIAGNOSIS — E559 Vitamin D deficiency, unspecified: Secondary | ICD-10-CM | POA: Insufficient documentation

## 2017-02-25 DIAGNOSIS — Z880 Allergy status to penicillin: Secondary | ICD-10-CM | POA: Insufficient documentation

## 2017-02-25 DIAGNOSIS — Z992 Dependence on renal dialysis: Secondary | ICD-10-CM | POA: Insufficient documentation

## 2017-02-25 DIAGNOSIS — K219 Gastro-esophageal reflux disease without esophagitis: Secondary | ICD-10-CM | POA: Insufficient documentation

## 2017-02-25 DIAGNOSIS — M79605 Pain in left leg: Secondary | ICD-10-CM | POA: Diagnosis not present

## 2017-02-25 DIAGNOSIS — X58XXXA Exposure to other specified factors, initial encounter: Secondary | ICD-10-CM | POA: Diagnosis not present

## 2017-02-25 DIAGNOSIS — Z87891 Personal history of nicotine dependence: Secondary | ICD-10-CM | POA: Diagnosis not present

## 2017-02-25 DIAGNOSIS — D631 Anemia in chronic kidney disease: Secondary | ICD-10-CM | POA: Diagnosis not present

## 2017-02-25 DIAGNOSIS — M7989 Other specified soft tissue disorders: Secondary | ICD-10-CM

## 2017-02-25 DIAGNOSIS — E440 Moderate protein-calorie malnutrition: Secondary | ICD-10-CM | POA: Insufficient documentation

## 2017-02-25 DIAGNOSIS — Z79899 Other long term (current) drug therapy: Secondary | ICD-10-CM | POA: Insufficient documentation

## 2017-02-25 DIAGNOSIS — E871 Hypo-osmolality and hyponatremia: Secondary | ICD-10-CM | POA: Diagnosis not present

## 2017-02-25 DIAGNOSIS — N186 End stage renal disease: Secondary | ICD-10-CM | POA: Insufficient documentation

## 2017-02-25 DIAGNOSIS — Z8673 Personal history of transient ischemic attack (TIA), and cerebral infarction without residual deficits: Secondary | ICD-10-CM | POA: Insufficient documentation

## 2017-02-25 DIAGNOSIS — E7800 Pure hypercholesterolemia, unspecified: Secondary | ICD-10-CM | POA: Insufficient documentation

## 2017-02-25 DIAGNOSIS — S76112A Strain of left quadriceps muscle, fascia and tendon, initial encounter: Secondary | ICD-10-CM | POA: Diagnosis not present

## 2017-02-25 DIAGNOSIS — I132 Hypertensive heart and chronic kidney disease with heart failure and with stage 5 chronic kidney disease, or end stage renal disease: Secondary | ICD-10-CM | POA: Insufficient documentation

## 2017-02-25 DIAGNOSIS — E1122 Type 2 diabetes mellitus with diabetic chronic kidney disease: Secondary | ICD-10-CM | POA: Diagnosis not present

## 2017-02-25 DIAGNOSIS — Z888 Allergy status to other drugs, medicaments and biological substances status: Secondary | ICD-10-CM | POA: Insufficient documentation

## 2017-02-25 DIAGNOSIS — I5042 Chronic combined systolic (congestive) and diastolic (congestive) heart failure: Secondary | ICD-10-CM | POA: Diagnosis not present

## 2017-02-25 DIAGNOSIS — I10 Essential (primary) hypertension: Secondary | ICD-10-CM | POA: Diagnosis not present

## 2017-02-25 DIAGNOSIS — L03116 Cellulitis of left lower limb: Secondary | ICD-10-CM | POA: Diagnosis not present

## 2017-02-25 DIAGNOSIS — Z794 Long term (current) use of insulin: Secondary | ICD-10-CM | POA: Insufficient documentation

## 2017-02-25 DIAGNOSIS — G894 Chronic pain syndrome: Secondary | ICD-10-CM | POA: Insufficient documentation

## 2017-02-25 LAB — COMPREHENSIVE METABOLIC PANEL
ALBUMIN: 2.9 g/dL — AB (ref 3.5–5.0)
ALT: 15 U/L (ref 14–54)
ANION GAP: 13 (ref 5–15)
AST: 28 U/L (ref 15–41)
Alkaline Phosphatase: 112 U/L (ref 38–126)
BUN: 27 mg/dL — ABNORMAL HIGH (ref 6–20)
CHLORIDE: 90 mmol/L — AB (ref 101–111)
CO2: 23 mmol/L (ref 22–32)
Calcium: 8.9 mg/dL (ref 8.9–10.3)
Creatinine, Ser: 5.21 mg/dL — ABNORMAL HIGH (ref 0.44–1.00)
GFR calc Af Amer: 10 mL/min — ABNORMAL LOW (ref >60)
GFR calc non Af Amer: 9 mL/min — ABNORMAL LOW (ref >60)
Glucose, Bld: 472 mg/dL — ABNORMAL HIGH (ref 65–99)
POTASSIUM: 4.6 mmol/L (ref 3.5–5.1)
SODIUM: 126 mmol/L — AB (ref 135–145)
Total Bilirubin: 1.2 mg/dL (ref 0.3–1.2)
Total Protein: 8.9 g/dL — ABNORMAL HIGH (ref 6.5–8.1)

## 2017-02-25 LAB — URINALYSIS, ROUTINE W REFLEX MICROSCOPIC
Bilirubin Urine: NEGATIVE
Glucose, UA: >=500 mg/dL — AB
KETONES UR: NEGATIVE mg/dL
LEUKOCYTES UA: NEGATIVE
Nitrite: NEGATIVE
PH: 8 (ref 5.0–8.0)
Protein, ur: >=300 mg/dL — AB
SPECIFIC GRAVITY, URINE: 1.013 (ref 1.005–1.030)

## 2017-02-25 LAB — CBC
HCT: 27.4 % — ABNORMAL LOW (ref 36.0–46.0)
Hemoglobin: 8.4 g/dL — ABNORMAL LOW (ref 12.0–15.0)
MCH: 26.3 pg (ref 26.0–34.0)
MCHC: 30.7 g/dL (ref 30.0–36.0)
MCV: 85.6 fL (ref 78.0–100.0)
Platelets: 242 K/uL (ref 150–400)
RBC: 3.2 MIL/uL — ABNORMAL LOW (ref 3.87–5.11)
RDW: 16.4 % — ABNORMAL HIGH (ref 11.5–15.5)
WBC: 3.8 10*3/uL — ABNORMAL LOW (ref 4.0–10.5)

## 2017-02-25 LAB — CBG MONITORING, ED
GLUCOSE-CAPILLARY: 455 mg/dL — AB (ref 65–99)
Glucose-Capillary: 415 mg/dL — ABNORMAL HIGH (ref 65–99)

## 2017-02-25 LAB — CBC WITH DIFFERENTIAL/PLATELET
Basophils Absolute: 0 10*3/uL (ref 0.0–0.1)
Basophils Relative: 1 %
Eosinophils Absolute: 0.1 10*3/uL (ref 0.0–0.7)
Eosinophils Relative: 3 %
HEMATOCRIT: 27.7 % — AB (ref 36.0–46.0)
Hemoglobin: 8.6 g/dL — ABNORMAL LOW (ref 12.0–15.0)
LYMPHS PCT: 18 %
Lymphs Abs: 0.7 10*3/uL (ref 0.7–4.0)
MCH: 27.1 pg (ref 26.0–34.0)
MCHC: 31 g/dL (ref 30.0–36.0)
MCV: 87.4 fL (ref 78.0–100.0)
MONO ABS: 0.1 10*3/uL (ref 0.1–1.0)
MONOS PCT: 4 %
NEUTROS ABS: 3 10*3/uL (ref 1.7–7.7)
Neutrophils Relative %: 74 %
Platelets: 224 10*3/uL (ref 150–400)
RBC: 3.17 MIL/uL — ABNORMAL LOW (ref 3.87–5.11)
RDW: 16.7 % — AB (ref 11.5–15.5)
WBC: 3.9 10*3/uL — ABNORMAL LOW (ref 4.0–10.5)

## 2017-02-25 LAB — MAGNESIUM: MAGNESIUM: 2.6 mg/dL — AB (ref 1.7–2.4)

## 2017-02-25 LAB — CK: CK TOTAL: 55 U/L (ref 38–234)

## 2017-02-25 LAB — GLUCOSE, CAPILLARY
GLUCOSE-CAPILLARY: 447 mg/dL — AB (ref 65–99)
Glucose-Capillary: 504 mg/dL (ref 65–99)

## 2017-02-25 LAB — SEDIMENTATION RATE: Sed Rate: 82 mm/hr — ABNORMAL HIGH (ref 0–22)

## 2017-02-25 LAB — I-STAT CG4 LACTIC ACID, ED
LACTIC ACID, VENOUS: 1.8 mmol/L (ref 0.5–1.9)
Lactic Acid, Venous: 1.1 mmol/L (ref 0.5–1.9)

## 2017-02-25 LAB — PHOSPHORUS: PHOSPHORUS: 2.7 mg/dL (ref 2.5–4.6)

## 2017-02-25 LAB — C-REACTIVE PROTEIN: CRP: <0.8 mg/dL (ref <1.0)

## 2017-02-25 MED ORDER — PENTAFLUOROPROP-TETRAFLUOROETH EX AERO: 1.0000 "application " | INHALATION_SPRAY | CUTANEOUS | Status: DC | PRN

## 2017-02-25 MED ORDER — COLCHICINE 0.6 MG PO TABS
0.3000 mg | ORAL_TABLET | Freq: Every day | ORAL | Status: DC
  Administered 2017-02-26 – 2017-02-28 (×4): 0.3 mg via ORAL
  Filled 2017-02-25 (×4): qty 1

## 2017-02-25 MED ORDER — ALTEPLASE 2 MG IJ SOLR: 2.0000 mg | Freq: Once | INTRAMUSCULAR | Status: DC | PRN

## 2017-02-25 MED ORDER — SODIUM CHLORIDE 0.9 % IV SOLN: 100.0000 mL | INTRAVENOUS | Status: DC | PRN

## 2017-02-25 MED ORDER — ALLOPURINOL 100 MG PO TABS: 100.0000 mg | ORAL_TABLET | ORAL | Status: DC

## 2017-02-25 MED ORDER — LIDOCAINE-PRILOCAINE 2.5-2.5 % EX CREA: 1.0000 "application " | TOPICAL_CREAM | CUTANEOUS | Status: DC | PRN

## 2017-02-25 MED ORDER — HEPARIN SODIUM (PORCINE) 1000 UNIT/ML DIALYSIS: 1000.0000 [IU] | INTRAMUSCULAR | Status: DC | PRN

## 2017-02-25 MED ORDER — SERTRALINE HCL 50 MG PO TABS
50.0000 mg | ORAL_TABLET | Freq: Every day | ORAL | Status: DC
  Administered 2017-02-26 – 2017-02-28 (×4): 50 mg via ORAL
  Filled 2017-02-25 (×4): qty 1

## 2017-02-25 MED ORDER — HALOPERIDOL 0.5 MG PO TABS
0.5000 mg | ORAL_TABLET | Freq: Every day | ORAL | Status: DC
  Administered 2017-02-26 – 2017-02-28 (×4): 0.5 mg via ORAL
  Filled 2017-02-25 (×4): qty 1

## 2017-02-25 MED ORDER — OXYCODONE-ACETAMINOPHEN 5-325 MG PO TABS
1.0000 | ORAL_TABLET | Freq: Once | ORAL | Status: AC
  Administered 2017-02-25: 1 via ORAL
  Filled 2017-02-25: qty 1

## 2017-02-25 MED ORDER — SODIUM CHLORIDE 0.9 % IV SOLN
100.0000 mL | INTRAVENOUS | Status: DC | PRN
Start: 2017-02-25 — End: 2017-02-25

## 2017-02-25 MED ORDER — TRAZODONE HCL 50 MG PO TABS
25.0000 mg | ORAL_TABLET | Freq: Every day | ORAL | Status: DC
  Administered 2017-02-26 – 2017-02-28 (×4): 25 mg via ORAL
  Filled 2017-02-25 (×4): qty 1

## 2017-02-25 MED ORDER — INSULIN ASPART 100 UNIT/ML ~~LOC~~ SOLN
5.0000 [IU] | Freq: Once | SUBCUTANEOUS | Status: AC
  Administered 2017-02-25: 5 [IU] via SUBCUTANEOUS
  Filled 2017-02-25: qty 1

## 2017-02-25 MED ORDER — OXYCODONE HCL 5 MG PO TABS
10.0000 mg | ORAL_TABLET | Freq: Four times a day (QID) | ORAL | Status: DC | PRN
  Administered 2017-02-26 – 2017-02-28 (×7): 10 mg via ORAL
  Filled 2017-02-25 (×7): qty 2

## 2017-02-25 MED ORDER — ACETAMINOPHEN 650 MG RE SUPP
650.0000 mg | Freq: Four times a day (QID) | RECTAL | Status: DC | PRN
Start: 2017-02-25 — End: 2017-03-01

## 2017-02-25 MED ORDER — COLCHICINE 0.6 MG PO TABS: 0.3000 mg | ORAL_TABLET | Freq: Once | ORAL | Status: DC

## 2017-02-25 MED ORDER — ONDANSETRON HCL 4 MG PO TABS: 4.0000 mg | ORAL_TABLET | Freq: Four times a day (QID) | ORAL | Status: DC | PRN

## 2017-02-25 MED ORDER — ATORVASTATIN CALCIUM 40 MG PO TABS
40.0000 mg | ORAL_TABLET | Freq: Every day | ORAL | Status: DC
  Administered 2017-02-26 – 2017-02-28 (×4): 40 mg via ORAL
  Filled 2017-02-25 (×4): qty 1

## 2017-02-25 MED ORDER — HEPARIN SODIUM (PORCINE) 5000 UNIT/ML IJ SOLN
5000.0000 [IU] | Freq: Three times a day (TID) | INTRAMUSCULAR | Status: DC
  Administered 2017-02-26 – 2017-02-27 (×4): 5000 [IU] via SUBCUTANEOUS
  Filled 2017-02-25 (×4): qty 1

## 2017-02-25 MED ORDER — FENTANYL CITRATE (PF) 100 MCG/2ML IJ SOLN: 50.0000 ug | Freq: Once | INTRAMUSCULAR | Status: DC

## 2017-02-25 MED ORDER — RENA-VITE PO TABS
1.0000 | ORAL_TABLET | Freq: Every day | ORAL | Status: DC
  Administered 2017-02-26 – 2017-02-28 (×4): 1 via ORAL
  Filled 2017-02-25 (×4): qty 1

## 2017-02-25 MED ORDER — INSULIN GLARGINE 100 UNIT/ML ~~LOC~~ SOLN
13.0000 [IU] | Freq: Every day | SUBCUTANEOUS | Status: DC
  Administered 2017-02-26 – 2017-02-28 (×4): 13 [IU] via SUBCUTANEOUS
  Filled 2017-02-25 (×4): qty 0.13

## 2017-02-25 MED ORDER — INSULIN ASPART 100 UNIT/ML ~~LOC~~ SOLN
5.0000 [IU] | Freq: Three times a day (TID) | SUBCUTANEOUS | Status: DC
  Administered 2017-02-26 – 2017-03-01 (×7): 5 [IU] via SUBCUTANEOUS

## 2017-02-25 MED ORDER — HYDROCORTISONE 2.5 % RE CREA
TOPICAL_CREAM | Freq: Two times a day (BID) | RECTAL | Status: DC
  Administered 2017-02-26: 01:00:00 via RECTAL
  Filled 2017-02-25: qty 28.35

## 2017-02-25 MED ORDER — ACETAMINOPHEN 325 MG PO TABS
650.0000 mg | ORAL_TABLET | Freq: Four times a day (QID) | ORAL | Status: DC | PRN
  Administered 2017-02-26: 650 mg via ORAL
  Filled 2017-02-25: qty 2

## 2017-02-25 MED ORDER — ONDANSETRON HCL 4 MG/2ML IJ SOLN: 4.0000 mg | Freq: Four times a day (QID) | INTRAMUSCULAR | Status: DC | PRN

## 2017-02-25 MED ORDER — CLONIDINE HCL 0.1 MG PO TABS
0.1000 mg | ORAL_TABLET | Freq: Three times a day (TID) | ORAL | Status: DC
  Administered 2017-02-26 – 2017-02-28 (×7): 0.1 mg via ORAL
  Filled 2017-02-25 (×7): qty 1

## 2017-02-25 MED ORDER — HYDRALAZINE HCL 20 MG/ML IJ SOLN
10.0000 mg | Freq: Four times a day (QID) | INTRAMUSCULAR | Status: DC | PRN
  Administered 2017-02-25: 10 mg via INTRAVENOUS
  Filled 2017-02-25: qty 1

## 2017-02-25 MED ORDER — AMLODIPINE BESYLATE 10 MG PO TABS
10.0000 mg | ORAL_TABLET | Freq: Every day | ORAL | Status: DC
  Administered 2017-02-26 – 2017-02-28 (×3): 10 mg via ORAL
  Filled 2017-02-25 (×4): qty 1

## 2017-02-25 MED ORDER — POLYETHYLENE GLYCOL 3350 17 G PO PACK
17.0000 g | PACK | Freq: Every day | ORAL | Status: DC | PRN
  Filled 2017-02-25: qty 1

## 2017-02-25 MED ORDER — CARVEDILOL 25 MG PO TABS
25.0000 mg | ORAL_TABLET | Freq: Two times a day (BID) | ORAL | Status: DC
  Administered 2017-02-26 – 2017-03-01 (×4): 25 mg via ORAL
  Filled 2017-02-25 (×6): qty 1

## 2017-02-25 MED ORDER — LIDOCAINE HCL (PF) 1 % IJ SOLN: 5.0000 mL | INTRAMUSCULAR | Status: DC | PRN

## 2017-02-25 MED ORDER — CALCIUM ACETATE (PHOS BINDER) 667 MG PO CAPS
1334.0000 mg | ORAL_CAPSULE | Freq: Three times a day (TID) | ORAL | Status: DC
  Administered 2017-02-26 – 2017-03-01 (×7): 1334 mg via ORAL
  Filled 2017-02-25 (×9): qty 2

## 2017-02-25 MED ORDER — ALLOPURINOL 100 MG PO TABS
100.0000 mg | ORAL_TABLET | ORAL | Status: DC
  Administered 2017-02-26 – 2017-02-27 (×2): 100 mg via ORAL
  Filled 2017-02-25 (×2): qty 1

## 2017-02-25 MED ORDER — ALBUTEROL SULFATE (2.5 MG/3ML) 0.083% IN NEBU: 3.0000 mL | INHALATION_SOLUTION | RESPIRATORY_TRACT | Status: DC | PRN

## 2017-02-25 NOTE — ED Notes (Signed)
Pt CBG was 455, notified Chrislyn(RN)

## 2017-02-25 NOTE — ED Provider Notes (Signed)
[facility] DEPT Provider Note   CSN: [id number] Arrival date & time: 02/25/17  8127     History   Chief Complaint Chief Complaint  Patient presents with  . Abscess    HPI [name] is a 51 y.o. female.  HPI   51 yo F with PMHx as below including sCHF, HTN, hep C, ESRD here with multiple complaints. Primary complaint is aching, throbbing left thigh pain. Pain has been present for weeks to months but is worsening. The area started as hard nodule and dark skin area that has progressively spread out. 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 She has not sought any care for this. She also reports right elbow pain and swelling. 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 No new redness, pain, or swelling, but states it is chronically painful. She also reports she is due for dialysis, endorses mild DOE and SOB.  Past Medical History:  Diagnosis Date  . Anemia   . Anxiety   . Bipolar disorder, unspecified ([place]) 02/06/2014  . Chronic combined systolic and diastolic CHF (congestive heart failure) (HCC)    EF 40% by echo and 48% by stress test  . Chronic pain syndrome   . Depression   . ESRD on hemodialysis ([place])   . GERD (gastroesophageal reflux disease)   . Glaucoma   . Headache    sinus headaches  . Hepatitis C   . High cholesterol   . History of hiatal hernia   . History of vitamin D deficiency 06/20/2015  . Hypertension   . MRSA infection ?2014   "on the back of my head; spread throughout my bloodstream"  . Neuropathy   . Refusal of blood transfusions as patient is Jehovah's Witness   . Schizoaffective disorder, unspecified condition 02/08/2014  . Seizure [facility]) dx'd 2014   "don't know what kind; last one was ~ 04/2014"  . Stroke [facility])    TIA 2009  . TIA (transient ischemic attack) 2010  . Type II  diabetes mellitus ([place])    INSULIN DEPENDENT    Patient Active Problem List   Diagnosis Date Noted  . External hemorrhoids   . Diverticulosis of colon without hemorrhage   . Goals of care, counseling/discussion   . Palliative care encounter   . Cellulitis of left leg 01/16/2017  . Diabetes mellitus with complication ([place]) 51/70/0174  . Upper GI bleed 01/16/2017  . UGIB (upper gastrointestinal bleed)   . Anemia due to chronic kidney disease   . Cellulitis of left thigh 01/01/2017  . ESRD (end stage renal disease) ([place]) 01/01/2017  . Cocaine use disorder, severe, dependence ([place]) 08/29/2016  . Hepatitis C 08/29/2016  . Diabetic ulcer of calf ([place]) 08/29/2016  . Diabetic ulcer of toe ([place]) 08/29/2016  . Cerebrovascular accident (CVA) ([place]) 08/29/2016  . MDD (major depressive disorder) 08/28/2016  . Dyslipidemia associated with type 2 diabetes mellitus ([place]) 11/10/2015  . History of vitamin D deficiency 06/20/2015  . Long term prescription opiate use 06/20/2015  . Chronic pain syndrome 06/19/2015  . Anemia of chronic kidney failure 05/01/2015  . transmetatarsal amputation of the left foot 12/13/2014  . Unilateral visual loss 11/22/2014  . 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 ([place]) 11/22/2014  . Chronic combined systolic (congestive) and diastolic (congestive) heart failure ([place])   .  Rectal bleeding 04/23/2014  . Anemia, iron deficiency 04/13/2014  . Noncompliance 02/02/2014  . Hyponatremia 05/19/2013  . CKD (chronic kidney disease) stage 5, GFR less than 15 ml/min (HCC) 05/18/2013  . Seizure disorder ([place]) 02/09/2013    Past Surgical History:  Procedure Laterality Date  . ABDOMINAL HYSTERECTOMY  2014  . ABDOMINAL HYSTERECTOMY  2010  . AMPUTATION Left 05/21/2013   Procedure: Left Midfoot AMPUTATION;  Surgeon: Newt Minion, MD;  Location: [facility];  Service: Orthopedics;  Laterality: Left;  Left Midfoot Amputation  . AV FISTULA PLACEMENT Left 05/04/2015   Procedure:  ARTERIOVENOUS (AV) GRAFT INSERTION;  Surgeon: Angelia Mould, MD;  Location: [facility];  Service: Vascular;  Laterality: Left;  . COLONOSCOPY WITH PROPOFOL N/A 06/22/2013   Procedure: COLONOSCOPY WITH PROPOFOL;  Surgeon: Jeryl Columbia, MD;  Location: [facility] ENDOSCOPY;  Service: Endoscopy;  Laterality: N/A;  . ESOPHAGOGASTRODUODENOSCOPY N/A 05/11/2015   Procedure: ESOPHAGOGASTRODUODENOSCOPY (EGD);  Surgeon: Laurence Spates, MD;  Location: [facility] ENDOSCOPY;  Service: Endoscopy;  Laterality: N/A;  . EYE SURGERY Bilateral 2010   Lasik  . FLEXIBLE SIGMOIDOSCOPY N/A 01/17/2017   Procedure: FLEXIBLE SIGMOIDOSCOPY w/ hemorrhoid banding possible;  Surgeon: Gatha Mayer, MD;  Location: [facility] ENDOSCOPY;  Service: Endoscopy;  Laterality: N/A;  . FOOT AMPUTATION Left    due diabetic neuropathy, could not feel ulcer on bottom of foot  . REFRACTIVE SURGERY Bilateral 2013  . TONSILLECTOMY  1970's  . TUBAL LIGATION  2000    OB History    Gravida Para Term Preterm AB Living   0 0 0 0 0     SAB TAB Ectopic Multiple Live Births   0 0 0           Home Medications    Prior to Admission medications   Medication Sig Start Date End Date Taking? Authorizing Provider  albuterol (PROVENTIL HFA;VENTOLIN HFA) 108 (90 Base) MCG/ACT inhaler Inhale 2 puffs into the lungs every 4 (four) hours as needed for wheezing or shortness of breath. 10/15/16  Yes Horton, Barbette Hair, MD  allopurinol (ZYLOPRIM) 100 MG tablet Take 1 tablet (100 mg total) by mouth every Tuesday, Thursday, and Saturday at 6 PM. 01/25/17  Yes Nita Sells, MD  amLODipine (NORVASC) 10 MG tablet Take 1 tablet (10 mg total) by mouth at bedtime. 09/02/16  Yes Hildred Priest, MD  atorvastatin (LIPITOR) 40 MG tablet Take 1 tablet (40 mg total) by mouth at bedtime. 09/02/16  Yes Hildred Priest, MD  calcium acetate (PHOSLO) 667 MG capsule Take 2 capsules (1,334 mg total) by mouth 3 (three) times daily with meals. 09/02/16  Yes  Hildred Priest, MD  carvedilol (COREG) 25 MG tablet Take 1 tablet (25 mg total) by mouth 2 (two) times daily with a meal. 09/02/16  Yes Hildred Priest, MD  cloNIDine (CATAPRES) 0.1 MG tablet Take 1 tablet (0.1 mg total) by mouth every 8 (eight) hours. 09/02/16  Yes Hildred Priest, MD  colchicine 0.6 MG tablet Take 0.5 tablets (0.3 mg total) by mouth daily. 02/04/17 03/04/17 Yes Nita Sells, MD  haloperidol (HALDOL) 0.5 MG tablet Take 1 tablet (0.5 mg total) by mouth every 8 (eight) hours as needed for agitation. Patient taking differently: Take 0.5 mg by mouth at bedtime.  09/03/16  Yes Hildred Priest, MD  hydrocortisone (ANUSOL-HC) 2.5 % rectal cream Place rectally 2 (two) times daily. 01/24/17  Yes Nita Sells, MD  insulin aspart (NOVOLOG) 100 UNIT/ML injection Inject 5 Units into the skin 3 (three) times daily with  meals. 09/03/16  Yes Hildred Priest, MD  insulin glargine (LANTUS) 100 UNIT/ML injection Inject 0.13 mLs (13 Units total) into the skin at bedtime. 01/24/17  Yes Nita Sells, MD  multivitamin (RENA-VIT) TABS tablet Take 1 tablet by mouth at bedtime. 09/02/16  Yes Hildred Priest, MD  oxyCODONE 10 MG TABS Take 1 tablet (10 mg total) by mouth every 6 (six) hours as needed for severe pain. 01/24/17  Yes Nita Sells, MD  sertraline (ZOLOFT) 50 MG tablet Take 1 tablet (50 mg total) by mouth daily. 09/02/16  Yes Hildred Priest, MD  traZODone (DESYREL) 50 MG tablet Take 0.5 tablets (25 mg total) by mouth at bedtime. 09/02/16  Yes Hildred Priest, MD  LORazepam (ATIVAN) 2 MG/ML injection Inject 1 mL (2 mg total) into the vein Every Tuesday,Thursday,and Saturday with dialysis. 09/03/16   Hildred Priest, MD    Family History Family History  Problem Relation Age of Onset  . Hypertension Mother   . Hypertension Father     Social History Social History  Substance  Use Topics  . Smoking status: Former Smoker    Packs/day: 1.00    Years: 20.00    Types: Cigarettes    Quit date: 03/31/2015  . Smokeless tobacco: Never Used     Comment: "stopped smoking in ~ 2013; restarted last week; stopped again 06/17/2014"  . Alcohol use 1.8 oz/week    3 Standard drinks or equivalent per week     Comment: 05/01/15 patient denies alcohol use.     Allergies   Penicillins; Gabapentin; Lyrica [pregabalin]; Saphris [asenapine]; Tramadol; and Other   Review of Systems Review of Systems  Constitutional: Positive for fatigue. Negative for chills and fever.  HENT: Negative for congestion and rhinorrhea.   Eyes: Negative for visual disturbance.  Respiratory: Negative for cough, shortness of breath and wheezing.   Cardiovascular: Negative for chest pain and leg swelling.  Gastrointestinal: Negative for abdominal pain, diarrhea, nausea and vomiting.  Genitourinary: Negative for dysuria and flank pain.  Musculoskeletal: Positive for arthralgias, joint swelling and myalgias. Negative for neck pain and neck stiffness.  Skin: Negative for rash and wound.  Allergic/Immunologic: Negative for immunocompromised state.  Neurological: Negative for syncope, weakness and headaches.  All other systems reviewed and are negative.    Physical Exam Updated Vital Signs BP (!) 177/92   Pulse 94   Temp 97.9 F (36.6 C) (Oral)   Resp (!) 25   Ht 5\' 4"  (1.626 m)   Wt 64.4 kg (142 lb)   LMP  (LMP Unknown)   SpO2 97%   BMI 24.37 kg/m   Physical Exam  Constitutional: She is oriented to person, place, and time. She appears well-developed and well-nourished. No distress.  HENT:  Head: Normocephalic and atraumatic.  Eyes: Conjunctivae are normal.  Neck: Neck supple.  Cardiovascular: Normal rate, regular rhythm and normal heart sounds.  Exam reveals no friction rub.   No murmur heard. Pulmonary/Chest: Effort normal and breath sounds normal. No respiratory distress. She has no  wheezes. She has no rales.  Abdominal: She exhibits no distension.  Musculoskeletal: She exhibits edema and tenderness.  Deformity to right elbow. Palpable bursa/fluid collection along anterolateral elbow, with NO ttp or warmth/erythema. Scattered, hyperpigmented nodules throughout b/l LE, with TTP over left thigh but no redness, fluctuance, or drainage. No warmth.  Neurological: She is alert and oriented to person, place, and time. She exhibits normal muscle tone.  Skin: Skin is warm. Capillary refill takes less than 2 seconds.  Psychiatric:  She has a normal mood and affect.  Nursing note and vitals reviewed.    ED Treatments / Results  Labs (all labs ordered are listed, but only abnormal results are displayed) Labs Reviewed  COMPREHENSIVE METABOLIC PANEL - Abnormal; Notable for the following:       Result Value   Sodium 126 (*)    Chloride 90 (*)    Glucose, Bld 472 (*)    BUN 27 (*)    Creatinine, Ser 5.21 (*)    Total Protein 8.9 (*)    Albumin 2.9 (*)    GFR calc non Af Amer 9 (*)    GFR calc Af Amer 10 (*)    All other components within normal limits  CBC WITH DIFFERENTIAL/PLATELET - Abnormal; Notable for the following:    WBC 3.9 (*)    RBC 3.17 (*)    Hemoglobin 8.6 (*)    HCT 27.7 (*)    RDW 16.7 (*)    All other components within normal limits  URINALYSIS, ROUTINE W REFLEX MICROSCOPIC - Abnormal; Notable for the following:    APPearance HAZY (*)    Glucose, UA >=500 (*)    Hgb urine dipstick SMALL (*)    Protein, ur >=300 (*)    Bacteria, UA RARE (*)    Squamous Epithelial / LPF 6-30 (*)    All other components within normal limits  MAGNESIUM - Abnormal; Notable for the following:    Magnesium 2.6 (*)    All other components within normal limits  CBG MONITORING, ED - Abnormal; Notable for the following:    Glucose-Capillary 455 (*)    All other components within normal limits  CULTURE, BLOOD (ROUTINE X 2)  CULTURE, BLOOD (ROUTINE X 2)  PHOSPHORUS    SEDIMENTATION RATE  C-REACTIVE PROTEIN  CK  I-STAT CG4 LACTIC ACID, ED  I-STAT CG4 LACTIC ACID, ED  CBG MONITORING, ED    EKG  EKG Interpretation None       Radiology Dg Chest 2 View  Result Date: 02/25/2017 CLINICAL DATA:  Weakness. Left lower extremity pain. Left lower extremity abscess. EXAM: CHEST  2 VIEW COMPARISON:  Two-view chest x-ray 01/20/2017 FINDINGS: Heart is enlarged. Mild edema is now present. Small bilateral pleural effusions are present. Minimal bibasilar airspace disease likely reflects atelectasis. The upper lung fields are clear. The visualized soft tissues and bony thorax are unremarkable. IMPRESSION: 1. Cardiomegaly and mild edema compatible with congestive heart failure. 2. Minimal bibasilar airspace disease likely reflects atelectasis. Electronically Signed   By: San Morelle M.D.   On: 02/25/2017 10:55   Dg Elbow Complete Right  Result Date: 02/25/2017 CLINICAL DATA:  51 year old female with pain and swelling right elbow. Injury from fall 2 years ago untreated. Initial encounter. EXAM: RIGHT ELBOW - COMPLETE 3+ VIEW COMPARISON:  None. 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 Surrounding soft tissue swelling which may indicate effusion although infection or hematoma not excluded. Radial head slightly irregular and remains aligned with the capitellum fracture fragment. Olecranon trochlea groove widening and aligned with the trochlea fracture fragment. Question soft tissue defect anterior elbow joint. 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 Surrounding soft tissue swelling which may indicate effusion although infection or hematoma not excluded. Radial head slightly irregular and remains aligned with the capitellum fracture fragment. Olecranon trochlea groove widening and aligned with the  trochlea fracture fragment. Electronically  Signed   By: Genia Del M.D.   On: 02/25/2017 10:57   Dg Femur Min 2 Views Left  Result Date: 02/25/2017 CLINICAL DATA:  Leg pain and swelling EXAM: LEFT FEMUR 2 VIEWS COMPARISON:  01/16/2017 FINDINGS: Soft tissue swelling in the distal thigh region. No acute bony abnormality. Specifically, no fracture, subluxation, or dislocation. Soft tissues are intact. No visible joint effusion within the left knee. Joint spaces are maintained. Vascular calcifications noted. IMPRESSION: No acute bony abnormality. Electronically Signed   By: Rolm Baptise M.D.   On: 02/25/2017 10:57    Procedures Procedures (including critical care time)  Medications Ordered in ED Medications  insulin aspart (novoLOG) injection 5 Units (not administered)  oxyCODONE-acetaminophen (PERCOCET/ROXICET) 5-325 MG per tablet 1 tablet (1 tablet Oral Given 02/25/17 1218)  insulin aspart (novoLOG) injection 5 Units (5 Units Subcutaneous Given 02/25/17 1217)  oxyCODONE-acetaminophen (PERCOCET/ROXICET) 5-325 MG per tablet 1 tablet (1 tablet Oral Given 02/25/17 1218)     Initial Impression / Assessment and Plan / ED Course  I have reviewed the triage vital signs and the nursing notes.  Pertinent labs & imaging results that were available during my care of the patient were reviewed by me and considered in my medical decision making (see chart for details).     51 yo F wth PMHx ESRD, HTN, HLD, DM here with worsening L leg and now calf pain. DVT study negative yesterday. DDx broad - pt was diagnosed with ? Of gout of leg weeks ago as inpt but sx have worsened now that she is off ABX. No fevers but feels generally weak. Exam is as above.   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 Jonnie Finner who has evaluated pt in ED. He feels that this may represent infection and recommends admission for repeat imaging, possible further work-up.  Final Clinical Impressions(s) / ED Diagnoses    Final diagnoses:  Left leg swelling  Leg pain, left  ESRD (end stage renal disease) ([place])    New Prescriptions New Prescriptions   No medications on file     Duffy Bruce, MD 02/25/17 1630

## 2017-02-25 NOTE — H&P (Signed)
History and Physical    [name] PYJ:[id number] DOB: 08-Dec-1965 DOA: 02/25/2017  PCP: Shirlean Mylar, MD  Patient coming from:Home  Chief Complaint:left leg pain  HPI: [name] is a 51 y.o. female with medical history significant of hepatitis C, hypertension, dyslipidemia, ESRD on hemodialysis, chronic combined systolic and diastolic congestive heart failure, bipolar disorder, anxiety/depression, type 2 diabetes presented with worsening left lower extremities pain associated with swelling. Patient was admitted about a month ago for similar problem when you she was found to have left eye phlegmon and had aspiration done. The cultures was negative. She was treated with antibiotics. At that time she was seen by gout when the antibiotics was discontinued and treated with colchicine and allopurinol. Patient reported that she went home but did not get better. Now she reported that the left eye is swollen associated with worsened pain in her left calf for about a week. Patient went to outside facility yesterday and had ultrasound of left lower extremities done when there is no DVT noticed however found to have mixed echogenic fluid similar to prior MRI imaging found. Patient reported occasional chills but denied fever, nausea vomiting headache or dizziness. No chest pain or shortness of breath.  ED Course: In the ER patient was found to have a febrile, hyponatremia. X-ray of the femur showed no acute bony abnormalities. X-ray of right elbow consistent with mild Union/nonunion of distal right humeral fracture with soft tissue swelling which may indicate fluid collection. Treated with pain medications. Nephrology consulted. Admitted for further evaluation.  Review of Systems: As per HPI otherwise 10 point review of systems negative.    Past Medical History:  Diagnosis Date  . Anemia   . Anxiety   . Bipolar disorder, unspecified (HCC) 02/06/2014  . Chronic combined systolic and diastolic CHF  (congestive heart failure) (HCC)    EF 40% by echo and 48% by stress test  . Chronic pain syndrome   . Depression   . ESRD on hemodialysis (HCC)   . GERD (gastroesophageal reflux disease)   . Glaucoma   . Headache    sinus headaches  . Hepatitis C   . High cholesterol   . History of hiatal hernia   . History of vitamin D deficiency 06/20/2015  . Hypertension   . MRSA infection ?2014   "on the back of my head; spread throughout my bloodstream"  . Neuropathy   . Refusal of blood transfusions as patient is Jehovah's Witness   . Schizoaffective disorder, unspecified condition 02/08/2014  . Seizure [facility]) dx'd 2014   "don't know what kind; last one was ~ 04/2014"  . Stroke [facility])    TIA 2009  . TIA (transient ischemic attack) 2010  . Type II diabetes mellitus (HCC)    INSULIN DEPENDENT    Past Surgical History:  Procedure Laterality Date  . ABDOMINAL HYSTERECTOMY  2014  . ABDOMINAL HYSTERECTOMY  2010  . AMPUTATION Left 05/21/2013   Procedure: Left Midfoot AMPUTATION;  Surgeon: Nadara Mustard, MD;  Location: [facility] OR;  Service: Orthopedics;  Laterality: Left;  Left Midfoot Amputation  . AV FISTULA PLACEMENT Left 05/04/2015   Procedure: ARTERIOVENOUS (AV) GRAFT INSERTION;  Surgeon: Chuck Hint, MD;  Location[facility] OR;  Service: Vascular;  Laterality: Left;  . COLONOSCOPY WITH PROPOFOL N/A 06/22/2013   Procedure: COLONOSCOPY WITH PROPOFOL;  Surgeon: Petra Kuba, MD;  Location: [facility] ENDOSCOPY;  Service: Endoscopy;  Laterality: N/A;  . ESOPHAGOGASTRODUODENOSCOPY N/A 05/11/2015   Procedure: ESOPHAGOGASTRODUODENOSCOPY (EGD);  Surgeon: Laurence Spates, MD;  Location: [facility] ENDOSCOPY;  Service: Endoscopy;  Laterality: N/A;  . EYE SURGERY Bilateral 2010   Lasik  . FLEXIBLE SIGMOIDOSCOPY N/A 01/17/2017   Procedure: FLEXIBLE SIGMOIDOSCOPY w/ hemorrhoid banding possible;  Surgeon: Gatha Mayer, MD;  Location: [facility] ENDOSCOPY;  Service: Endoscopy;  Laterality: N/A;  . FOOT AMPUTATION Left    due  diabetic neuropathy, could not feel ulcer on bottom of foot  . REFRACTIVE SURGERY Bilateral 2013  . TONSILLECTOMY  1970's  . TUBAL LIGATION  2000    Social history: reports that she quit smoking about 22 months ago. Her smoking use included Cigarettes. She has a 20.00 pack-year smoking history. She has never used smokeless tobacco. She reports that she drinks about 1.8 oz of alcohol per week . She reports that she uses drugs, including "Crack" cocaine and Cocaine.  Allergies  Allergen Reactions  . Penicillins Hives and Other (See Comments)    Has patient had a PCN reaction causing immediate rash, facial/tongue/throat swelling, SOB or lightheadedness with hypotension: Yes Has patient had a PCN reaction causing severe rash involving mucus membranes or skin necrosis: No Has patient had a PCN reaction that required hospitalization No Has patient had a PCN reaction occurring within the last 10 years: No If all of the above answers are "NO", then may proceed with Cephalosporin use. Pt tolerates cefepime and ceftriaxone  . Gabapentin Itching and Swelling  . Lyrica [Pregabalin] Swelling and Other (See Comments)    Facial and leg swelling  . Saphris [Asenapine] Swelling and Other (See Comments)    Facial swelling  . Tramadol Swelling and Other (See Comments)    Leg swelling  . Other Other (See Comments)    NO BLOOD PRODUCTS. 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 Hypertension Mother   . Hypertension Father      Prior to Admission medications   Medication Sig Start Date End Date Taking? Authorizing Provider  albuterol (PROVENTIL HFA;VENTOLIN HFA) 108 (90 Base) MCG/ACT inhaler Inhale 2 puffs into the lungs every 4 (four) hours as needed for wheezing or shortness of breath. 10/15/16  Yes Horton, Barbette Hair, MD  allopurinol (ZYLOPRIM) 100 MG tablet Take 1 tablet (100 mg total) by mouth every Tuesday, Thursday, and Saturday at 6 PM. 01/25/17  Yes Nita Sells, MD  amLODipine (NORVASC) 10 MG tablet Take 1 tablet (10 mg total) by mouth at bedtime. 09/02/16  Yes Hildred Priest, MD  atorvastatin (LIPITOR) 40 MG tablet Take 1 tablet (40 mg total) by mouth at bedtime. 09/02/16  Yes Hildred Priest, MD  calcium acetate (PHOSLO) 667 MG capsule Take 2 capsules (1,334 mg total) by mouth 3 (three) times daily with meals. 09/02/16  Yes Hildred Priest, MD  carvedilol (COREG) 25 MG tablet Take 1 tablet (25 mg total) by mouth 2 (two) times daily with a meal. 09/02/16  Yes Hildred Priest, MD  cloNIDine (CATAPRES) 0.1 MG tablet Take 1 tablet (0.1 mg total) by mouth every 8 (eight) hours. 09/02/16  Yes Hildred Priest, MD  colchicine 0.6 MG tablet Take 0.5 tablets (0.3 mg total) by mouth daily. 02/04/17 03/04/17 Yes Nita Sells, MD  haloperidol (HALDOL) 0.5 MG tablet Take 1 tablet (0.5 mg total) by mouth every 8 (eight) hours as needed for agitation. Patient taking differently: Take 0.5 mg by mouth at bedtime.  09/03/16  Yes Hildred Priest, MD  hydrocortisone (ANUSOL-HC) 2.5 % rectal cream Place rectally 2 (two)  times daily. 01/24/17  Yes Nita Sells, MD  insulin aspart (NOVOLOG) 100 UNIT/ML injection Inject 5 Units into the skin 3 (three) times daily with meals. 09/03/16  Yes Hildred Priest, MD  insulin glargine (LANTUS) 100 UNIT/ML injection Inject 0.13 mLs (13 Units total) into the skin at bedtime. 01/24/17  Yes Nita Sells, MD  multivitamin (RENA-VIT) TABS tablet Take 1 tablet by mouth at bedtime. 09/02/16  Yes Hildred Priest, MD  oxyCODONE 10 MG TABS Take 1 tablet (10 mg total) by mouth every 6 (six) hours as needed for severe pain. 01/24/17  Yes Nita Sells, MD  sertraline (ZOLOFT) 50 MG tablet Take 1 tablet (50 mg total) by mouth daily. 09/02/16  Yes Hildred Priest, MD  traZODone (DESYREL) 50 MG tablet Take 0.5 tablets (25 mg  total) by mouth at bedtime. 09/02/16  Yes Hildred Priest, MD  LORazepam (ATIVAN) 2 MG/ML injection Inject 1 mL (2 mg total) into the vein Every Tuesday,Thursday,and Saturday with dialysis. 09/03/16   Hildred Priest, MD    Physical Exam: Vitals:   02/25/17 1400 02/25/17 1430 02/25/17 1515 02/25/17 1525  BP:    (!) 177/92  Pulse:      Resp: (!) 23 (!) 27 17 (!) 25  Temp:      TempSrc:      SpO2:    97%  Weight:      Height:          Constitutional: NAD, calm, comfortable Vitals:   02/25/17 1400 02/25/17 1430 02/25/17 1515 02/25/17 1525  BP:    (!) 177/92  Pulse:      Resp: (!) 23 (!) 27 17 (!) 25  Temp:      TempSrc:      SpO2:    97%  Weight:      Height:       Eyes: PERRL ENMT: Mucous membranes are moist.  Neck: normal, supple Respiratory: bibasal fine crackles, no wheeze, Normal respiratory effort.  Cardiovascular: Regular rate and rhythm, no murmurs / rubs / gallops. Trace LE edema   Abdomen: Soft, nontender. Bowel sound positive  Musculoskeletal: firm soft tissue swelling on left anterior thigh, left knee mild swelling. Right elbow has non-union joints and soft tissue swelling looks chronic in nature, skin intact. Skin: no rashes, lesions, ulcers. No induration Neurologic: Alert awake and following commands  Psychiatric: Normal judgment and insight. Alert and oriented x 3. Normal mood.    Labs on Admission: I have personally reviewed following labs and imaging studies  CBC:  Recent Labs Lab 02/25/17 1012  WBC 3.9*  NEUTROABS 3.0  HGB 8.6*  HCT 27.7*  MCV 87.4  PLT 734   Basic Metabolic Panel:  Recent Labs Lab 02/25/17 1012 02/25/17 1016  NA 126*  --   K 4.6  --   CL 90*  --   CO2 23  --   GLUCOSE 472*  --   BUN 27*  --   CREATININE 5.21*  --   CALCIUM 8.9  --   MG  --  2.6*  PHOS  --  2.7   GFR: Estimated Creatinine Clearance: 11.2 mL/min (A) (by C-G formula based on SCr of 5.21 mg/dL (H)). Liver Function  Tests:  Recent Labs Lab 02/25/17 1012  AST 28  ALT 15  ALKPHOS 112  BILITOT 1.2  PROT 8.9*  ALBUMIN 2.9*   No results for input(s): LIPASE, AMYLASE in the last 168 hours. No results for input(s): AMMONIA in the last 168 hours. Coagulation Profile: No  results for input(s): INR, PROTIME in the last 168 hours. Cardiac Enzymes: No results for input(s): CKTOTAL, CKMB, CKMBINDEX, TROPONINI in the last 168 hours. BNP (last 3 results) No results for input(s): PROBNP in the last 8760 hours. HbA1C: No results for input(s): HGBA1C in the last 72 hours. CBG:  Recent Labs Lab 02/25/17 1214  GLUCAP 455*   Lipid Profile: No results for input(s): CHOL, HDL, LDLCALC, TRIG, CHOLHDL, LDLDIRECT in the last 72 hours. Thyroid Function Tests: No results for input(s): TSH, T4TOTAL, FREET4, T3FREE, THYROIDAB in the last 72 hours. Anemia Panel: No results for input(s): VITAMINB12, FOLATE, FERRITIN, TIBC, IRON, RETICCTPCT in the last 72 hours. Urine analysis:    Component Value Date/Time   COLORURINE YELLOW 02/25/2017 1224   APPEARANCEUR HAZY (A) 02/25/2017 1224   LABSPEC 1.013 02/25/2017 1224   PHURINE 8.0 02/25/2017 1224   GLUCOSEU >=500 (A) 02/25/2017 1224   HGBUR SMALL (A) 02/25/2017 1224   BILIRUBINUR NEGATIVE 02/25/2017 1224   BILIRUBINUR neg 12/13/2014 1127   KETONESUR NEGATIVE 02/25/2017 1224   PROTEINUR >=300 (A) 02/25/2017 1224   UROBILINOGEN 0.2 05/01/2015 1252   NITRITE NEGATIVE 02/25/2017 1224   LEUKOCYTESUR NEGATIVE 02/25/2017 1224   Sepsis Labs: !!!!!!!!!!!!!!!!!!!!!!!!!!!!!!!!!!!!!!!!!!!! _0 (procalcitonin:4,lacticidven:4) )No results found for this or any previous visit (from the past 240 hour(s)).   Radiological Exams on Admission: Dg Chest 2 View  Result Date: 02/25/2017 CLINICAL DATA:  Weakness. Left lower extremity pain. Left lower extremity abscess. EXAM: CHEST  2 VIEW COMPARISON:  Two-view chest x-ray 01/20/2017 FINDINGS: Heart is enlarged. Mild edema is  now present. Small bilateral pleural effusions are present. Minimal bibasilar airspace disease likely reflects atelectasis. The upper lung fields are clear. The visualized soft tissues and bony thorax are unremarkable. IMPRESSION: 1. Cardiomegaly and mild edema compatible with congestive heart failure. 2. Minimal bibasilar airspace disease likely reflects atelectasis. Electronically Signed   By: San Morelle M.D.   On: 02/25/2017 10:55   Dg Elbow Complete Right  Result Date: 02/25/2017 CLINICAL DATA:  51 year old female with pain and swelling right elbow. Injury from fall 2 years ago untreated. Initial encounter. EXAM: RIGHT ELBOW - COMPLETE 3+ VIEW COMPARISON:  None. 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 Surrounding soft tissue swelling which may indicate effusion although infection or hematoma not excluded. Radial head slightly irregular and remains aligned with the capitellum fracture fragment. Olecranon trochlea groove widening and aligned with the trochlea fracture fragment. Question soft tissue defect anterior elbow joint. 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 Surrounding soft tissue swelling which may indicate effusion although infection or hematoma not excluded. Radial head slightly irregular and remains aligned with the capitellum fracture fragment. Olecranon trochlea groove widening and aligned with the trochlea fracture fragment. Electronically Signed   By: Genia Del M.D.   On: 02/25/2017 10:57   Dg Femur Min 2 Views Left  Result Date: 02/25/2017 CLINICAL DATA:  Leg pain and swelling EXAM: LEFT FEMUR 2 VIEWS COMPARISON:  01/16/2017 FINDINGS: Soft tissue swelling in the distal thigh region. No acute bony abnormality. Specifically, no fracture, subluxation, or dislocation. Soft tissues are intact. No  visible joint effusion within the left knee. Joint spaces are maintained. Vascular calcifications noted. IMPRESSION: No acute bony abnormality. Electronically Signed   By: Rolm Baptise M.D.   On: 02/25/2017 10:57    EKG: Independently reviewed. Sinus rhythm  Assessment/Plan Active Problems:   Leg  pain, left   Left leg pain  # Left leg pain and swelling: acute on chronic in nature. -This is a recurrent problem. Patient recently had extensive evaluation including MRI when patient underwent IR drainage of the fluid found to have phlegmon. Treated with antibiotics with no improvement. Later on treated with colchicine and allopurinol with some improvement. The fluid cultures was negative. Exact etiology unknown. He does not look like calciphylaxis. Patient has no fever, leukocytosis and no sign of infection on examination. -I will start workup with MRI of leg. Patient had ultrasound Doppler of left lower extremities yesterday seen in care everywhere. The Doppler ultrasound reported as mixed echogenic fluid within the bilateral thighs. Patient may benefit from aspiration therefore I consulted interventional radiology. It was aspirated in prior admission.  -May consult orthopedics after the MRI result. -Continue pain management. -Check CRP, CK and ESR. -Pain management and supportive care.  #ESRD on hemodialysis: Already evaluated by nephrologist in the ER. Land for hemodialysis tomorrow. Monitor blood pressure and labs.  #Chronic combined systolic and diastolic congestive heart failure: Denied shortness of breath or chest pain. Chest x-ray with mild pulmonary edema consistent with chronic CHF in hemodialysis patient. Continue dialysis treatment and low salt diet.  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 She has nonunion of the joint. On examination there is mild soft tissue swelling  reported chronic but the patient. As per care everywhere, she had CT scan of elbow yesterday it was read as displaced nonunion supracondylar fracture of the distal humerus. Continue supportive care and consult orthopedics in a.m.  #Essential hypertension: Continue home medication. Monitor blood pressure. BP elevated in the ER.  #Chronic hyponatremia in hemodialysis patient likely related with volume: Continue hemodialysis treatment with increased ultrafiltration. Monitor BMP.  #Anemia of chronic kidney disease: As per medical chart patient does not received transfusion as patient is Jehovah witness  Continue supportive care  DVT prophylaxis: Heparin subcutaneous Code Status: Full code Family Communication: No family at bedside Disposition Plan: Observe in the right lower Consults called: Consulted IR Admission status: Observation   Dron Tanna Furry MD [facility] Pager [phone]  If 7PM-7AM, please contact night-coverage [email or web address] Password [email or web address]  02/25/2017, 5:00 PM

## 2017-02-25 NOTE — Consult Note (Addendum)
Renal Service Consult Note [facility]  [name] 02/25/2017 Roney Jaffe D Requesting Physician:  Dr Ellender Hose   Reason for Consult:  ESRD pt w/ recurrent pain, swelling and skin lesions L leg HPI: The patient is a 51 y.o. year-old with history of DM2, TIA/ CVA, HTN, hep C, ESRD on HD, chronic pain, combined CHF and bipolar d/o w/ schizoaffective disorder. Pt here with recurrent pain and swelling of the L thigh and new L calf pain and swelling x 2 weeks.  Asked to see for consultation.   Pt was here w/ new L thigh pain and swelling from May 29- 31.  She had phlegmon L thigh and had aspiration and 6-7 days of IV abx and "it got better". Then she was seen by ortho who diagnosed gout, the abx were stopped and she was dc'd home on allopurinol and colchicine.  Patient said she then got worse at home. She took a course of doxycycline prescribed by her renal MD. Still not getting better, comes to ED today.  No fevers.  She has 2 wks L lower leg pain and swelling which is new.  She has US done yest which showed no DVT, large Baker's cyst partially calcified, and "mixed echogenic fluid is again seen within the bilateral thighs, similar to recent femur MRI performed 01/16/2017".  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    She has chronically deformed R elbow after trauma, f/b [facility].    ROS  denies CP  no joint pain   no HA  no blurry vision  no rash  no diarrhea  no nausea/ vomiting  no dysuria  no difficulty voiding  no change in urine color    Past Medical History  Past Medical History:  Diagnosis Date  . Anemia   . Anxiety   . Bipolar disorder, unspecified ([place]) 02/06/2014  . Chronic combined systolic and diastolic CHF (congestive heart failure) (HCC)    EF 40% by echo and 48% by stress test  . Chronic pain syndrome   . Depression   . ESRD on hemodialysis ([place])   . GERD (gastroesophageal reflux disease)   . Glaucoma   .  Headache    sinus headaches  . Hepatitis C   . High cholesterol   . History of hiatal hernia   . History of vitamin D deficiency 06/20/2015  . Hypertension   . MRSA infection ?2014   "on the back of my head; spread throughout my bloodstream"  . Neuropathy   . Refusal of blood transfusions as patient is Jehovah's Witness   . Schizoaffective disorder, unspecified condition 02/08/2014  . Seizure [facility]) dx'd 2014   "don't know what kind; last one was ~ 04/2014"  . Stroke [facility])    TIA 2009  . TIA (transient ischemic attack) 2010  . Type II diabetes mellitus ([place])    INSULIN DEPENDENT   Past Surgical History  Past Surgical History:  Procedure Laterality Date  . ABDOMINAL HYSTERECTOMY  2014  . ABDOMINAL HYSTERECTOMY  2010  . AMPUTATION Left 05/21/2013   Procedure: Left Midfoot AMPUTATION;  Surgeon: Newt Minion, MD;  Location: [place];  Service: Orthopedics;  Laterality: Left;  Left Midfoot Amputation  . AV FISTULA PLACEMENT Left 05/04/2015   Procedure: ARTERIOVENOUS (AV) GRAFT INSERTION;  Surgeon: Angelia Mould, MD;  Location: [place];  Service: Vascular;  Laterality: Left;  . COLONOSCOPY WITH PROPOFOL N/A 06/22/2013   Procedure: COLONOSCOPY WITH PROPOFOL;  Surgeon: Jeryl Columbia, MD;  Location: [facility];  Service: Endoscopy;  Laterality: N/A;  . ESOPHAGOGASTRODUODENOSCOPY N/A 05/11/2015   Procedure: ESOPHAGOGASTRODUODENOSCOPY (EGD);  Surgeon: Laurence Spates, MD;  Location: [facility] ENDOSCOPY;  Service: Endoscopy;  Laterality: N/A;  . EYE SURGERY Bilateral 2010   Lasik  . FLEXIBLE SIGMOIDOSCOPY N/A 01/17/2017   Procedure: FLEXIBLE SIGMOIDOSCOPY w/ hemorrhoid banding possible;  Surgeon: Gatha Mayer, MD;  Location: [facility] ENDOSCOPY;  Service: Endoscopy;  Laterality: N/A;  . FOOT AMPUTATION Left    due diabetic neuropathy, could not feel ulcer on bottom of foot  . REFRACTIVE SURGERY Bilateral 2013  . TONSILLECTOMY  1970's  . TUBAL LIGATION  2000   Family History  Family History  Problem  Relation Age of Onset  . Hypertension Mother   . Hypertension Father    Social History  reports that she quit smoking about 22 months ago. Her smoking use included Cigarettes. She has a 20.00 pack-year smoking history. She has never used smokeless tobacco. She reports that she drinks about 1.8 oz of alcohol per week . She reports that she uses drugs, including "Crack" cocaine and Cocaine. Allergies  Allergies  Allergen Reactions  . Penicillins Hives and Other (See Comments)    Has patient had a PCN reaction causing immediate rash, facial/tongue/throat swelling, SOB or lightheadedness with hypotension: Yes Has patient had a PCN reaction causing severe rash involving mucus membranes or skin necrosis: No Has patient had a PCN reaction that required hospitalization No Has patient had a PCN reaction occurring within the last 10 years: No If all of the above answers are "NO", then may proceed with Cephalosporin use. Pt tolerates cefepime and ceftriaxone  . Gabapentin Itching and Swelling  . Lyrica [Pregabalin] Swelling and Other (See Comments)    Facial and leg swelling  . Saphris [Asenapine] Swelling and Other (See Comments)    Facial swelling  . Tramadol Swelling and Other (See Comments)    Leg swelling  . Other Other (See Comments)    NO BLOOD PRODUCTS. PT IS A [place] medications Prior to Admission medications   Medication Sig Start Date End Date Taking? Authorizing Provider  albuterol (PROVENTIL HFA;VENTOLIN HFA) 108 (90 Base) MCG/ACT inhaler Inhale 2 puffs into the lungs every 4 (four) hours as needed for wheezing or shortness of breath. 10/15/16  Yes Horton, Barbette Hair, MD  allopurinol (ZYLOPRIM) 100 MG tablet Take 1 tablet (100 mg total) by mouth every Tuesday, Thursday, and Saturday at 6 PM. 01/25/17  Yes Nita Sells, MD  amLODipine (NORVASC) 10 MG tablet Take 1 tablet (10 mg total) by mouth at bedtime. 09/02/16  Yes Hildred Priest, MD  atorvastatin  (LIPITOR) 40 MG tablet Take 1 tablet (40 mg total) by mouth at bedtime. 09/02/16  Yes Hildred Priest, MD  calcium acetate (PHOSLO) 667 MG capsule Take 2 capsules (1,334 mg total) by mouth 3 (three) times daily with meals. 09/02/16  Yes Hildred Priest, MD  carvedilol (COREG) 25 MG tablet Take 1 tablet (25 mg total) by mouth 2 (two) times daily with a meal. 09/02/16  Yes Hildred Priest, MD  cloNIDine (CATAPRES) 0.1 MG tablet Take 1 tablet (0.1 mg total) by mouth every 8 (eight) hours. 09/02/16  Yes Hildred Priest, MD  colchicine 0.6 MG tablet Take 0.5 tablets (0.3 mg total) by mouth daily. 02/04/17 03/04/17 Yes Nita Sells, MD  haloperidol (HALDOL) 0.5 MG tablet Take 1 tablet (0.5 mg total) by mouth every 8 (eight) hours as needed for  agitation. Patient taking differently: Take 0.5 mg by mouth at bedtime.  09/03/16  Yes Hildred Priest, MD  hydrocortisone (ANUSOL-HC) 2.5 % rectal cream Place rectally 2 (two) times daily. 01/24/17  Yes Nita Sells, MD  insulin aspart (NOVOLOG) 100 UNIT/ML injection Inject 5 Units into the skin 3 (three) times daily with meals. 09/03/16  Yes Hildred Priest, MD  insulin glargine (LANTUS) 100 UNIT/ML injection Inject 0.13 mLs (13 Units total) into the skin at bedtime. 01/24/17  Yes Nita Sells, MD  multivitamin (RENA-VIT) TABS tablet Take 1 tablet by mouth at bedtime. 09/02/16  Yes Hildred Priest, MD  oxyCODONE 10 MG TABS Take 1 tablet (10 mg total) by mouth every 6 (six) hours as needed for severe pain. 01/24/17  Yes Nita Sells, MD  sertraline (ZOLOFT) 50 MG tablet Take 1 tablet (50 mg total) by mouth daily. 09/02/16  Yes Hildred Priest, MD  traZODone (DESYREL) 50 MG tablet Take 0.5 tablets (25 mg total) by mouth at bedtime. 09/02/16  Yes Hildred Priest, MD  LORazepam (ATIVAN) 2 MG/ML injection Inject 1 mL (2 mg total) into the vein Every  Tuesday,Thursday,and Saturday with dialysis. 09/03/16   Hildred Priest, MD   Liver Function Tests  Recent Labs Lab 02/25/17 1012  AST 28  ALT 15  ALKPHOS 112  BILITOT 1.2  PROT 8.9*  ALBUMIN 2.9*   No results for input(s): LIPASE, AMYLASE in the last 168 hours. CBC  Recent Labs Lab 02/25/17 1012  WBC 3.9*  NEUTROABS 3.0  HGB 8.6*  HCT 27.7*  MCV 87.4  PLT 993   Basic Metabolic Panel  Recent Labs Lab 02/25/17 1012 02/25/17 1016  NA 126*  --   K 4.6  --   CL 90*  --   CO2 23  --   GLUCOSE 472*  --   BUN 27*  --   CREATININE 5.21*  --   CALCIUM 8.9  --   PHOS  --  2.7   Iron/TIBC/Ferritin/ %Sat    Component Value Date/Time   IRON 36 01/16/2017 1113   TIBC 206 (L) 01/16/2017 1113   FERRITIN 776 (H) 01/16/2017 1113   IRONPCTSAT 17 01/16/2017 1113    Vitals:   02/25/17 1200 02/25/17 1216 02/25/17 1230 02/25/17 1330  BP:  (!) 188/88    Pulse:      Resp: 18 14 (!) 24 19  Temp:      TempSrc:      SpO2:      Weight:      Height:       Exam Gen alert, no distress No rash, cyanosis or gangrene Sclera anicteric, throat clear No jvd or bruits Chest clear bilat RRR no MRG Abd soft ntnd no mass or ascites +bs GU normal female MS R elbow chronically angulated, some swelling/ redness of the bursa L thigh larger than left w/ some firm SQ tissue at the distal lateral aspect, no fluctuance, no eschar / skin necrosis, no tender SQ nodules or masses.  Has posterior L thigh fullness, but also is nontender.  A number of small hyperpigmented skin papules on the legs , shins and the lower back.  One area of dark skin discoloration on the R outer lower shin which is nontender and no surrounding erythmea.   Ext no LE edema / L foot TMA Neuro is alert, Ox 3 , nf    D[facility]: [facility] 3.[facility]   400/8900  63kg    2/2.25 bath  LFA AVG - hectorol 1  ug tiw - venofer 100/hd  - mircera 225 every 2 wks    Impression: 1  Left leg pain / swelling - not sure  what this is, she responded to IV abx and aspiratoin in June, but then was switched to gout Rx.  The exam doesn't support calciphylaxis, no tender SQ nodules or typical skin findings.  Papular skin lesions not calicphylaxis.  Will follow.  Have d/w ED MD.  2  ESRD on HD - plan HD tomorrow 3  DM2 4  HTN cont home bp meds 5  Hep C 6  Hx drug abuse 7  Hx combined CHF 8  Hx CVA 9  Anemia of CKD - cont ESA as needed 10 MBD of CKD - cont meds for now    Plan - HD tomorrow off schedule  Kelly Splinter MD [place] pager [phone]   02/25/2017, 2:05 PM

## 2017-02-25 NOTE — Progress Notes (Signed)
CSW and CM spoke with pt at bedside to discuss living options for pt. Pt informed CSW that pt's sister and her have been living together. Pt and sister are currently having  Difficult times living together and pt expressed the idea of getting pt's own place to live. Pt did mentioned to CSW that pt is able to return to pt's sister house at the time of discharge. CSW will continue to assist with discharge needs.  [name], MSW, [place] [facility] [facility] Clinical Social Worker [phone]

## 2017-02-25 NOTE — ED Notes (Signed)
Nephrologist at bedside

## 2017-02-25 NOTE — ED Triage Notes (Addendum)
Pt arrives from home via [facility] reporting LLE pain d/t abscess in thigh and calf.  Pt reports prior hospitalizations for abscess. Pt states, "I haven't been med compliant." EMS reports pt due for dialysis today, reports pt states too sick to go.  Pt afebrile at this time.

## 2017-02-26 DIAGNOSIS — M79605 Pain in left leg: Secondary | ICD-10-CM | POA: Diagnosis not present

## 2017-02-26 DIAGNOSIS — I5022 Chronic systolic (congestive) heart failure: Secondary | ICD-10-CM | POA: Diagnosis not present

## 2017-02-26 DIAGNOSIS — E118 Type 2 diabetes mellitus with unspecified complications: Secondary | ICD-10-CM

## 2017-02-26 DIAGNOSIS — I10 Essential (primary) hypertension: Secondary | ICD-10-CM

## 2017-02-26 DIAGNOSIS — N186 End stage renal disease: Secondary | ICD-10-CM | POA: Diagnosis not present

## 2017-02-26 DIAGNOSIS — M25521 Pain in right elbow: Secondary | ICD-10-CM

## 2017-02-26 DIAGNOSIS — M7989 Other specified soft tissue disorders: Secondary | ICD-10-CM

## 2017-02-26 DIAGNOSIS — L03116 Cellulitis of left lower limb: Secondary | ICD-10-CM | POA: Diagnosis not present

## 2017-02-26 LAB — RENAL FUNCTION PANEL
Albumin: 2.6 g/dL — ABNORMAL LOW (ref 3.5–5.0)
Anion gap: 9 (ref 5–15)
BUN: 33 mg/dL — ABNORMAL HIGH (ref 6–20)
CO2: 25 mmol/L (ref 22–32)
Calcium: 8.7 mg/dL — ABNORMAL LOW (ref 8.9–10.3)
Chloride: 90 mmol/L — ABNORMAL LOW (ref 101–111)
Creatinine, Ser: 5.97 mg/dL — ABNORMAL HIGH (ref 0.44–1.00)
GFR calc Af Amer: 9 mL/min — ABNORMAL LOW (ref >60)
GFR calc non Af Amer: 7 mL/min — ABNORMAL LOW (ref >60)
Glucose, Bld: 490 mg/dL — ABNORMAL HIGH (ref 65–99)
Phosphorus: 3.1 mg/dL (ref 2.5–4.6)
Potassium: 4.1 mmol/L (ref 3.5–5.1)
Sodium: 124 mmol/L — ABNORMAL LOW (ref 135–145)

## 2017-02-26 LAB — BASIC METABOLIC PANEL
Anion gap: 8 (ref 5–15)
BUN: 34 mg/dL — AB (ref 6–20)
CHLORIDE: 97 mmol/L — AB (ref 101–111)
CO2: 24 mmol/L (ref 22–32)
CREATININE: 6.16 mg/dL — AB (ref 0.44–1.00)
Calcium: 8.7 mg/dL — ABNORMAL LOW (ref 8.9–10.3)
GFR calc Af Amer: 8 mL/min — ABNORMAL LOW (ref >60)
GFR calc non Af Amer: 7 mL/min — ABNORMAL LOW (ref >60)
Glucose, Bld: 132 mg/dL — ABNORMAL HIGH (ref 65–99)
Potassium: 4.1 mmol/L (ref 3.5–5.1)
SODIUM: 129 mmol/L — AB (ref 135–145)

## 2017-02-26 LAB — CBC
HCT: 25.3 % — ABNORMAL LOW (ref 36.0–46.0)
Hemoglobin: 8.1 g/dL — ABNORMAL LOW (ref 12.0–15.0)
MCH: 27.4 pg (ref 26.0–34.0)
MCHC: 32 g/dL (ref 30.0–36.0)
MCV: 85.5 fL (ref 78.0–100.0)
PLATELETS: 242 10*3/uL (ref 150–400)
RBC: 2.96 MIL/uL — ABNORMAL LOW (ref 3.87–5.11)
RDW: 16.7 % — AB (ref 11.5–15.5)
WBC: 4.8 10*3/uL (ref 4.0–10.5)

## 2017-02-26 LAB — GLUCOSE, CAPILLARY
GLUCOSE-CAPILLARY: 106 mg/dL — AB (ref 65–99)
GLUCOSE-CAPILLARY: 284 mg/dL — AB (ref 65–99)
Glucose-Capillary: 150 mg/dL — ABNORMAL HIGH (ref 65–99)
Glucose-Capillary: 149 mg/dL — ABNORMAL HIGH (ref 65–99)

## 2017-02-26 LAB — MRSA PCR SCREENING: MRSA by PCR: NEGATIVE

## 2017-02-26 MED ORDER — INSULIN ASPART 100 UNIT/ML ~~LOC~~ SOLN
16.0000 [IU] | SUBCUTANEOUS | Status: AC
  Administered 2017-02-26: 16 [IU] via SUBCUTANEOUS

## 2017-02-26 MED ORDER — NEPRO/CARBSTEADY PO LIQD
237.0000 mL | Freq: Two times a day (BID) | ORAL | Status: DC
  Administered 2017-02-26 – 2017-02-27 (×2): 237 mL via ORAL

## 2017-02-26 MED ORDER — DARBEPOETIN ALFA 200 MCG/0.4ML IJ SOSY
200.0000 ug | PREFILLED_SYRINGE | INTRAMUSCULAR | Status: DC
  Administered 2017-02-26: 200 ug via INTRAVENOUS

## 2017-02-26 MED ORDER — DARBEPOETIN ALFA 200 MCG/0.4ML IJ SOSY
PREFILLED_SYRINGE | INTRAMUSCULAR | Status: AC
  Filled 2017-02-26: qty 0.4

## 2017-02-26 MED ORDER — OXYCODONE HCL 5 MG PO TABS
ORAL_TABLET | ORAL | Status: AC
  Filled 2017-02-26: qty 2

## 2017-02-26 MED ORDER — INSULIN ASPART 100 UNIT/ML ~~LOC~~ SOLN
0.0000 [IU] | Freq: Three times a day (TID) | SUBCUTANEOUS | Status: DC
  Administered 2017-02-26: 2 [IU] via SUBCUTANEOUS
  Administered 2017-02-27: 3 [IU] via SUBCUTANEOUS
  Administered 2017-02-28: 2 [IU] via SUBCUTANEOUS
  Administered 2017-02-28: 5 [IU] via SUBCUTANEOUS
  Administered 2017-03-01: 11 [IU] via SUBCUTANEOUS

## 2017-02-26 MED ORDER — SODIUM CHLORIDE 0.9 % IV SOLN
125.0000 mg | INTRAVENOUS | Status: DC
  Administered 2017-02-28: 125 mg via INTRAVENOUS
  Filled 2017-02-26 (×2): qty 10

## 2017-02-26 NOTE — Progress Notes (Signed)
Patient refuses bed alarm, agrees to call for help and to wait for assistance before getting out of bed

## 2017-02-26 NOTE — Progress Notes (Signed)
[name] NOTE    DELTA PICHON  HBZ:[id number] DOB: [birth year] [name], [name] DOA: 02/25/2017 PCP: Maurice Small, [name]   Chief Complaint  Patient presents with  . Abscess    Brief Narrative:  HPI on 02/25/17 by Dr. Nestor Ramp is a 51 y.o. female with medical history significant of hepatitis [name], [name] [name], [name] [name], [name] on hemodialysis, chronic combined systolic and diastolic congestive heart failure, bipolar disorder, anxiety/depression, type 2 diabetes presented with worsening left lower extremities pain associated with swelling. Patient was admitted about a month ago for similar problem when you she was found to have left eye phlegmon and had aspiration done. The cultures was negative. She was treated with antibiotics. At that time she was seen by gout when the antibiotics was discontinued and treated with colchicine and allopurinol. Patient reported that she went home but did not get better. Now she reported that the left eye is swollen associated with worsened pain in her left calf for about a week. Patient went to outside facility yesterday and had ultrasound of left lower extremities done when there is no DVT noticed however found to have mixed echogenic fluid similar to prior MRI imaging found. Patient reported occasional chills but denied fever, nausea vomiting headache or dizziness. No chest pain or shortness of breath. Assessment & Plan   Left leg pain and edema, acute on chronic -Patient recently had interventional radiology drain phlegmon in June 2018 -Patient was treated with antibiotics if no improvement, later treated as gout with mild improvement. 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 Surgery was not done at that time due to her anemia -Patient does have mild soft tissue swelling which she states has been there for some time -Posterior care everywhere, patient had CT scan of the elbow on 02/24/2017 showing displaced nonunion subcondylar fracture of the distal humerus -Continue pain control -Spoke with Dr. Sharol Given, orthopedic, who recommended upper extremity specialist- elbow  Essential [name] [name] -Continue amlodipine, Coreg, clonidine  Chronic hyponatremia -Likely secondary to volume, continue hemodialysis  Anemia of chronic disease -Patient is a Jehovah's Witness -Hemoglobin currently 8.1, continue to monitor CBC  Diabetes mellitus, type II -Continue insulin sliding scale CBG monitoring  Hyperlipidemia -Continue statin  DVT Prophylaxis    Code Status: Heparin  Family Communication: None at bedside  Disposition Plan:Observation, pending MRI.  Consultants Nephrology Dr. Sharol Given, via phone  Procedures  None  Antibiotics   Anti-infectives    None      Subjective:   [name] seen and examined today.  Continues to complain of leg pain as well as right elbow pain. Denies chest pain, shortness breath, abdominal pain, nausea or vomiting, diarrhea or constipation.  Objective:   Vitals:   02/26/17 1000 02/26/17 1030 02/26/17 1053 02/26/17 1140  BP: (!) 185/95  (!) 182/92 (!) 158/89  Pulse: 90 90 86 89  Resp: 16 (!) _0 Temp:   98 F (36.7 [name]) 98.2 F (36.8 [name])  TempSrc:   Oral Oral  SpO2:   98% 100%  Weight:   62.6 kg (138 lb 0.1 oz)   Height:        Intake/Output  Summary (Last 24 hours) at 02/26/17 1620 Last data filed at 02/26/17 1418  Gross per 24 hour  Intake              240 ml  Output             4500 ml  Net            -4260 ml   Filed Weights   02/25/17 2030 02/26/17 0700 02/26/17 1053  Weight: 66.9 kg (147 lb 7.8 oz) 67.5 kg (148 lb 13 oz) 62.6 kg (138 lb 0.1 oz)    Exam  General: Well developed, well nourished, NAD, appears stated age  HEENT: NCAT, mucous membranes moist.   Cardiovascular: S1 S2 auscultated, no rubs, murmurs or gallops. Regular rate and rhythm.  Respiratory: Clear to auscultation bilaterally with equal chest rise  Abdomen: Soft, nontender, nondistended, + bowel sounds  Extremities: warm dry without cyanosis clubbing or edema. Left foot with TMA.  Right elbow angulated with erythema and bursa. Left thigh fullness.   Neuro: AAOx3, nonfocal  Psych: Normal affect and demeanor   Data Reviewed: I have personally reviewed following labs and imaging studies  CBC:  Recent Labs Lab 02/25/17 1012 02/25/17 2318 02/26/17 0715  WBC 3.9* 3.8* 4.8  NEUTROABS 3.0  --   --   HGB 8.6* 8.4* 8.1*  HCT 27.7* 27.4* 25.3*  MCV 87.4 85.6 85.5  PLT 224 242 458   Basic Metabolic Panel:  Recent Labs Lab 02/25/17 1012 02/25/17 1016 02/25/17 2318 02/26/17 0715  NA 126*  --  124* 129*  K 4.6  --  4.1 4.1  CL 90*  --  90* 97*  CO2 23  --  25 24  GLUCOSE 472*  --  490* 132*  BUN 27*  --  33* 34*  CREATININE 5.21*  --  5.97* 6.16*  CALCIUM 8.9  --  8.7* 8.7*  MG  --  2.6*  --   --   PHOS  --  2.7 3.1  --    GFR: Estimated Creatinine Clearance: 9.4 mL/min (A) (by [name]-G formula based on SCr of 6.16 mg/dL (H)). Liver Function Tests:  Recent Labs Lab 02/25/17 1012 02/25/17 2318  AST 28  --   ALT 15  --   ALKPHOS 112  --   BILITOT 1.2  --   PROT 8.9*  --   ALBUMIN 2.9* 2.6*   No results for input(s): LIPASE, AMYLASE in the last 168 hours. No results for input(s): AMMONIA in the last 168 hours. Coagulation  Profile: No results for input(s): INR, PROTIME in the last 168 hours. Cardiac Enzymes:  Recent Labs Lab 02/25/17 1928  CKTOTAL 55   BNP (last 3 results) No results for input(s): PROBNP in the last 8760 hours. HbA1C: No results for input(s): HGBA1C in the last 72 hours. CBG:  Recent Labs Lab 02/25/17 1943 02/25/17 2026 02/25/17 2244 02/26/17 0624 02/26/17 1133  GLUCAP 415* 447* 504* 150* 149*   Lipid Profile: No results for input(s): CHOL, HDL, LDLCALC, TRIG, CHOLHDL, LDLDIRECT in the last 72 hours. Thyroid Function Tests: No results for input(s): TSH, T4TOTAL, FREET4, T3FREE, THYROIDAB in the last 72 hours. Anemia Panel: No results for input(s): VITAMINB12, FOLATE, FERRITIN, TIBC, IRON, RETICCTPCT in the last 72 hours. Urine analysis:  Component Value Date/Time   COLORURINE YELLOW 02/25/2017 1224   APPEARANCEUR HAZY (A) 02/25/2017 1224   LABSPEC 1.013 02/25/2017 1224   PHURINE 8.0 02/25/2017 1224   GLUCOSEU >=500 (A) 02/25/2017 1224   HGBUR [name] (A) 02/25/2017 1224   BILIRUBINUR NEGATIVE 02/25/2017 1224   BILIRUBINUR neg 12/13/2014 1127   KETONESUR NEGATIVE 02/25/2017 1224   PROTEINUR >=300 (A) 02/25/2017 1224   UROBILINOGEN 0.2 09/[name]/2016 1252   NITRITE NEGATIVE 02/25/2017 1224   LEUKOCYTESUR NEGATIVE 02/25/2017 1224   Sepsis Labs: _0 (procalcitonin:4,lacticidven:4)  ) Recent Results (from the past 240 hour(s))  Blood culture (routine x 2)     Status: None (Preliminary result)   Collection Time: 02/25/17  7:20 PM  Result Value Ref Range Status   Specimen Description BLOOD RIGHT ANTECUBITAL  Final   Special Requests   Final    BOTTLES DRAWN AEROBIC AND ANAEROBIC Blood Culture adequate volume   Culture NO GROWTH < 12 HOURS  Final   Report Status PENDING  Incomplete  Blood culture (routine x 2)     Status: None (Preliminary result)   Collection Time: 02/25/17  7:27 PM  Result Value Ref Range Status   Specimen Description BLOOD RIGHT UPPER ARM   Final   Special Requests   Final    BOTTLES DRAWN AEROBIC AND ANAEROBIC Blood Culture adequate volume   Culture NO GROWTH < 12 HOURS  Final   Report Status PENDING  Incomplete  MRSA PCR Screening     Status: None   Collection Time: 02/26/17  1:27 AM  Result Value Ref Range Status   MRSA by PCR NEGATIVE NEGATIVE Final    Comment:        The GeneXpert MRSA Assay (FDA approved for NASAL specimens only), is one component of a comprehensive MRSA colonization surveillance program. It is not intended to diagnose MRSA infection nor to guide or monitor treatment for MRSA infections.       Radiology Studies: Dg Chest 2 View  Result Date: 02/25/2017 CLINICAL DATA:  Weakness. Left lower extremity pain. Left lower extremity abscess. EXAM: CHEST  2 VIEW COMPARISON:  Two-view chest x-ray 01/20/2017 FINDINGS: Heart is enlarged. Mild edema is now present. [name] bilateral pleural effusions are present. Minimal bibasilar airspace disease likely reflects atelectasis. The upper lung fields are clear. The visualized soft tissues and bony thorax are unremarkable. IMPRESSION: 1. Cardiomegaly and mild edema compatible with congestive heart failure. 2. Minimal bibasilar airspace disease likely reflects atelectasis. Electronically Signed   By: San Morelle M.D.   On: 02/25/2017 10:55   Dg Elbow Complete Right  Result Date: 02/25/2017 CLINICAL DATA:  51 year old female with pain and swelling right elbow. Injury from fall 2 years ago untreated. Initial encounter. EXAM: RIGHT ELBOW - COMPLETE 3+ VIEW COMPARISON:  None. 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 Surrounding soft tissue swelling which may indicate effusion although infection or hematoma not excluded. Radial head slightly irregular and remains aligned with the capitellum fracture fragment. Olecranon trochlea groove widening and aligned with the trochlea  fracture fragment. Question soft tissue defect anterior elbow joint. 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 Surrounding soft tissue swelling which may indicate effusion although infection or hematoma not excluded. Radial head slightly irregular and remains aligned with the capitellum fracture fragment. Olecranon trochlea groove widening and aligned with the trochlea fracture fragment. Electronically Signed   By: Genia Del  M.D.   On: 02/25/2017 10:57   Dg Femur Min 2 Views Left  Result Date: 02/25/2017 CLINICAL DATA:  Leg pain and swelling EXAM: LEFT FEMUR 2 VIEWS COMPARISON:  01/16/2017 FINDINGS: Soft tissue swelling in the distal thigh region. No acute bony abnormality. Specifically, no fracture, subluxation, or dislocation. Soft tissues are intact. No visible joint effusion within the left knee. Joint spaces are maintained. Vascular calcifications noted. IMPRESSION: No acute bony abnormality. Electronically Signed   By: Rolm Baptise M.D.   On: 02/25/2017 10:57     Scheduled Meds: . allopurinol  100 mg Oral Q T,Th,Sat-1800  . amLODipine  10 mg Oral QHS  . atorvastatin  40 mg Oral QHS  . calcium acetate  1,334 mg Oral TID WC  . carvedilol  25 mg Oral BID WC  . cloNIDine  0.1 mg Oral Q8H  . colchicine  0.3 mg Oral Daily  . darbepoetin (ARANESP) injection - DIALYSIS  200 mcg Intravenous Q Wed-HD  . feeding supplement (NEPRO CARB STEADY)  237 mL Oral BID BM  . haloperidol  0.5 mg Oral QHS  . heparin  5,000 Units Subcutaneous Q8H  . hydrocortisone   Rectal BID  . insulin aspart  0-15 Units Subcutaneous TID WC  . insulin aspart  5 Units Subcutaneous TID WC  . insulin glargine  13 Units Subcutaneous QHS  . multivitamin  1 tablet Oral QHS  . sertraline  50 mg Oral Daily  . traZODone  25 mg Oral QHS   Continuous Infusions: . [START ON 02/28/2017] ferric gluconate (FERRLECIT/NULECIT) IV       LOS: 0  days   Time Spent in minutes   30 minutes  Phuoc Huy D.O. on 02/26/2017 at 4:20 PM  Between 7am to 7pm - Pager - [phone]  After 7pm go to [email or web address] - password [email or web address]  And look for the night coverage person covering for me after hours  [facility] Office  [phone]

## 2017-02-26 NOTE — Care Management Obs Status (Signed)
[facility] NOTIFICATION   Patient Details  Name: KRIMSON MASSMANN MRN: [id number] Date of Birth: 08-11-1965   Medicare Observation Status Notification Given:  Yes    Carles Collet, RN 02/26/2017, 4:04 PM

## 2017-02-26 NOTE — Progress Notes (Signed)
IR aware of request for evaluation; however, the patient has no current imaging to determine whether she needs an intervention or not.  MRI scheduled.  Will await these results.  Brayan Votaw E

## 2017-02-26 NOTE — Progress Notes (Signed)
[facility] Progress Note  Subjective:  Seen on HD UF goal 5L  Worried about leg, still having pain MRI pending   Objective Vitals:   02/26/17 0708 02/26/17 0730 02/26/17 0800 02/26/17 0830  BP: (!) 196/98 (!) 174/94 (!) 163/89 (!) 180/111  Pulse: 84 85 83 85  Resp: 18 11 (!) 22 13  Temp:      TempSrc:      SpO2:      Weight:      Height:       Physical Exam General: WNWD NAD Heart: RRR Lungs: CTAB Abdomen: soft NT, ND Extremities: R elbow chronically angulated, some swelling/ redness of the bursa some firm SQ tissue at the distal lateral aspect, no fluctuance, no eschar / skin necrosis, no tender SQ nodules or masses.  Has posterior L thigh fullness, but also is nontender.  A number of small hyperpigmented skin papules on the legs , shins and the lower back.  One area of dark skin discoloration on the R outer lower shin which is nontender and no surrounding erythmea.  no LE edema / L foot TMA Dialysis Access: LUE AVG cannulated on HD   Additional Objective Labs: Basic Metabolic Panel:  Recent Labs Lab 02/25/17 1012 02/25/17 1016 02/25/17 2318 02/26/17 0715  NA 126*  --  124* 129*  K 4.6  --  4.1 4.1  CL 90*  --  90* 97*  CO2 23  --  25 24  GLUCOSE 472*  --  490* 132*  BUN 27*  --  33* 34*  CREATININE 5.21*  --  5.97* 6.16*  CALCIUM 8.9  --  8.7* 8.7*  PHOS  --  2.7 3.1  --    Liver Function Tests:  Recent Labs Lab 02/25/17 1012 02/25/17 2318  AST 28  --   ALT 15  --   ALKPHOS 112  --   BILITOT 1.2  --   PROT 8.9*  --   ALBUMIN 2.9* 2.6*   No results for input(s): LIPASE, AMYLASE in the last 168 hours. CBC:  Recent Labs Lab 02/25/17 1012 02/25/17 2318 02/26/17 0715  WBC 3.9* 3.8* 4.8  NEUTROABS 3.0  --   --   HGB 8.6* 8.4* 8.1*  HCT 27.7* 27.4* 25.3*  MCV 87.4 85.6 85.5  PLT 224 242 242   Blood Culture    Component Value Date/Time   SDES ABSCESS THIGH 01/18/2017 1603   SPECREQUEST NONE 01/18/2017 1603   CULT No growth  aerobically or anaerobically. 01/18/2017 1603   REPTSTATUS 01/23/2017 FINAL 01/18/2017 1603    Cardiac Enzymes:  Recent Labs Lab 02/25/17 1928  CKTOTAL 55   CBG:  Recent Labs Lab 02/25/17 1214 02/25/17 1943 02/25/17 2026 02/25/17 2244 02/26/17 0624  GLUCAP 455* 415* 447* 504* 150*   Iron Studies: No results for input(s): IRON, TIBC, TRANSFERRIN, FERRITIN in the last 72 hours. Lab Results  Component Value Date   INR 1.29 01/16/2017   INR 1.35 07/21/2016   INR 1.4 (A) 12/23/2015   INR 1.2 (A) 12/23/2015   PROTIME 17.3 (A) 12/23/2015   PROTIME 15.9 (A) 12/23/2015   Medications: . sodium chloride    . sodium chloride     . allopurinol  100 mg Oral Q T,Th,Sat-1800  . amLODipine  10 mg Oral QHS  . atorvastatin  40 mg Oral QHS  . calcium acetate  1,334 mg Oral TID WC  . carvedilol  25 mg Oral BID WC  . cloNIDine  0.1 mg  Oral Q8H  . colchicine  0.3 mg Oral Daily  . feeding supplement (NEPRO CARB STEADY)  237 mL Oral BID BM  . haloperidol  0.5 mg Oral QHS  . heparin  5,000 Units Subcutaneous Q8H  . hydrocortisone   Rectal BID  . insulin aspart  0-15 Units Subcutaneous TID WC  . insulin aspart  5 Units Subcutaneous TID WC  . insulin glargine  13 Units Subcutaneous QHS  . multivitamin  1 tablet Oral QHS  . sertraline  50 mg Oral Daily  . traZODone  25 mg Oral QHS   Dialysis:  [facility] 3[facility]   400/800  63kg    2/2.25 bath  LFA AVG - hectorol 1 ug tiw - venofer 100/hd (until 8/2)  - mircera 225 every 2 wks (last 7/12)   Assessment/Plan: 1. Left leg pain / swelling - not sure cause. Exam doesn't support calciphylaxis.  Will follow. MRI pending  2. ESRD - TTS HD today off schedule  3. 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 HTN/volume - BP elevated 4.5kg over EDW, UF to EDW  6. Hx drug abuse 7. H/x combined CHF 8  Hx CVA   Ogechi Larina Earthly PA-C [facility] Pager  [phone] 02/26/2017,9:30 AM  LOS: 0 days    Pt seen, examined and agree w A/P as above.  Kelly Splinter MD [employer] pager [phone]   02/26/2017, 12:25 PM

## 2017-02-27 ENCOUNTER — Observation Stay (HOSPITAL_COMMUNITY): Payer: Medicare Other

## 2017-02-27 DIAGNOSIS — E118 Type 2 diabetes mellitus with unspecified complications: Secondary | ICD-10-CM | POA: Diagnosis not present

## 2017-02-27 DIAGNOSIS — D638 Anemia in other chronic diseases classified elsewhere: Secondary | ICD-10-CM

## 2017-02-27 DIAGNOSIS — N186 End stage renal disease: Secondary | ICD-10-CM | POA: Diagnosis not present

## 2017-02-27 DIAGNOSIS — E785 Hyperlipidemia, unspecified: Secondary | ICD-10-CM

## 2017-02-27 DIAGNOSIS — M79605 Pain in left leg: Secondary | ICD-10-CM | POA: Diagnosis not present

## 2017-02-27 DIAGNOSIS — T148XXA Other injury of unspecified body region, initial encounter: Secondary | ICD-10-CM | POA: Diagnosis not present

## 2017-02-27 DIAGNOSIS — M7989 Other specified soft tissue disorders: Secondary | ICD-10-CM | POA: Diagnosis not present

## 2017-02-27 DIAGNOSIS — I10 Essential (primary) hypertension: Secondary | ICD-10-CM | POA: Diagnosis not present

## 2017-02-27 DIAGNOSIS — L03116 Cellulitis of left lower limb: Secondary | ICD-10-CM | POA: Diagnosis not present

## 2017-02-27 LAB — CBC
HCT: 24.5 % — ABNORMAL LOW (ref 36.0–46.0)
HCT: 25.9 % — ABNORMAL LOW (ref 36.0–46.0)
Hemoglobin: 7.6 g/dL — ABNORMAL LOW (ref 12.0–15.0)
Hemoglobin: 7.9 g/dL — ABNORMAL LOW (ref 12.0–15.0)
MCH: 27 pg (ref 26.0–34.0)
MCH: 27.1 pg (ref 26.0–34.0)
MCHC: 31 g/dL (ref 30.0–36.0)
MCHC: 30.5 g/dL (ref 30.0–36.0)
MCV: 86.9 fL (ref 78.0–100.0)
MCV: 88.7 fL (ref 78.0–100.0)
PLATELETS: 165 10*3/uL (ref 150–400)
Platelets: 217 10*3/uL (ref 150–400)
RBC: 2.82 MIL/uL — AB (ref 3.87–5.11)
RBC: 2.92 MIL/uL — ABNORMAL LOW (ref 3.87–5.11)
RDW: 16.7 % — AB (ref 11.5–15.5)
RDW: 17.1 % — AB (ref 11.5–15.5)
WBC: 3.7 10*3/uL — ABNORMAL LOW (ref 4.0–10.5)
WBC: 3 10*3/uL — ABNORMAL LOW (ref 4.0–10.5)

## 2017-02-27 LAB — BASIC METABOLIC PANEL
Anion gap: 7 (ref 5–15)
BUN: 19 mg/dL (ref 6–20)
CALCIUM: 8.7 mg/dL — AB (ref 8.9–10.3)
CO2: 29 mmol/L (ref 22–32)
CREATININE: 4.16 mg/dL — AB (ref 0.44–1.00)
Chloride: 96 mmol/L — ABNORMAL LOW (ref 101–111)
GFR calc Af Amer: 13 mL/min — ABNORMAL LOW (ref >60)
GFR, EST NON AFRICAN AMERICAN: 12 mL/min — AB (ref >60)
Glucose, Bld: 201 mg/dL — ABNORMAL HIGH (ref 65–99)
POTASSIUM: 4.2 mmol/L (ref 3.5–5.1)
SODIUM: 132 mmol/L — AB (ref 135–145)

## 2017-02-27 LAB — GLUCOSE, CAPILLARY
GLUCOSE-CAPILLARY: 193 mg/dL — AB (ref 65–99)
Glucose-Capillary: 160 mg/dL — ABNORMAL HIGH (ref 65–99)
Glucose-Capillary: 91 mg/dL (ref 65–99)
Glucose-Capillary: 106 mg/dL — ABNORMAL HIGH (ref 65–99)
Glucose-Capillary: 180 mg/dL — ABNORMAL HIGH (ref 65–99)

## 2017-02-27 MED ORDER — NEPRO/CARBSTEADY PO LIQD
237.0000 mL | Freq: Three times a day (TID) | ORAL | Status: DC
  Administered 2017-02-27 – 2017-02-28 (×4): 237 mL via ORAL

## 2017-02-27 MED ORDER — HEPARIN SODIUM (PORCINE) 5000 UNIT/ML IJ SOLN
5000.0000 [IU] | Freq: Three times a day (TID) | INTRAMUSCULAR | Status: DC
  Administered 2017-02-28: 5000 [IU] via SUBCUTANEOUS
  Filled 2017-02-27: qty 1

## 2017-02-27 MED ORDER — CAMPHOR-MENTHOL 0.5-0.5 % EX LOTN
TOPICAL_LOTION | CUTANEOUS | Status: DC | PRN
  Administered 2017-02-27: 22:00:00 via TOPICAL
  Filled 2017-02-27: qty 222

## 2017-02-27 NOTE — Progress Notes (Signed)
[facility] Progress Note  Subjective:  MRI pending   Objective Vitals:   02/26/17 1626 02/26/17 2156 02/27/17 0720 02/27/17 0905  BP: (!) 163/70 (!) 152/71 (!) 143/56 (!) 156/69  Pulse: 85 82 72 73  Resp: 18 20 18 18   Temp: 98.8 F (37.1 C) 99.2 F (37.3 C) 98 F (36.7 C) 97.8 F (36.6 C)  TempSrc: Oral Oral Oral Oral  SpO2: 96% 97% 97% 100%  Weight:  63.6 kg (140 lb 4.8 oz)    Height:       Physical Exam General: WNWD NAD Heart: RRR Lungs: CTAB Abdomen: soft NT, ND Extremities: R elbow chronically angulated, some swelling/ redness of the bursa some firm SQ tissue at the distal lateral aspect, no fluctuance, no eschar / skin necrosis, no tender SQ nodules or masses.  Has posterior L thigh fullness, but also is nontender.  A number of small hyperpigmented skin papules on the legs , shins and the lower back.  One area of dark skin discoloration on the R outer lower shin which is black, not thick and nontender w/ no surrounding erythmea.  no LE edema / L foot TMA Dialysis Access: LUE AVG cannulated on HD   Dialysis:  TTS  [facility] 3.5h   400/800  63kg    2/2.25 bath  LFA AVG - hectorol 1 ug tiw - venofer 100/hd (until 8/2)  - mircera 225 every 2 wks (last 7/12)   Assessment/Plan: 1. Left leg pain / swelling - not sure cause. Exam doesn't support calciphylaxis.  Per primary. Will follow. MRI pending  2. ESRD - TTS HD.  HD tomorrow off schedule then again on Sat.  3. 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 HTN/volume - BP elevated 4.5kg over EDW, UF to EDW  6. Hx drug abuse 7. H/x combined CHF 8  Hx CVA    Kelly Splinter MD [facility] pager [phone]   02/27/2017, 1:24 PM  Additional Objective Labs: Basic Metabolic Panel:  Recent Labs Lab 02/25/17 1016 02/25/17 2318 02/26/17 0715 02/27/17 0332  NA  --  124* 129* 132*  K  --  4.1 4.1 4.2  CL  --  90* 97* 96*  CO2  --   25 24 29   GLUCOSE  --  490* 132* 201*  BUN  --  33* 34* 19  CREATININE  --  5.97* 6.16* 4.16*  CALCIUM  --  8.7* 8.7* 8.7*  PHOS 2.7 3.1  --   --    Liver Function Tests:  Recent Labs Lab 02/25/17 1012 02/25/17 2318  AST 28  --   ALT 15  --   ALKPHOS 112  --   BILITOT 1.2  --   PROT 8.9*  --   ALBUMIN 2.9* 2.6*   No results for input(s): LIPASE, AMYLASE in the last 168 hours. CBC:  Recent Labs Lab 02/25/17 1012 02/25/17 2318 02/26/17 0715 02/27/17 0332  WBC 3.9* 3.8* 4.8 3.7*  NEUTROABS 3.0  --   --   --   HGB 8.6* 8.4* 8.1* 7.6*  HCT 27.7* 27.4* 25.3* 24.5*  MCV 87.4 85.6 85.5 86.9  PLT 224 242 242 165   Blood Culture    Component Value Date/Time   SDES BLOOD RIGHT UPPER ARM 02/25/2017 1927   SPECREQUEST  02/25/2017 1927    BOTTLES DRAWN AEROBIC AND ANAEROBIC Blood Culture adequate volume   CULT NO GROWTH 2  DAYS 02/25/2017 1927   REPTSTATUS PENDING 02/25/2017 1927    Cardiac Enzymes:  Recent Labs Lab 02/25/17 1928  CKTOTAL 55   CBG:  Recent Labs Lab 02/26/17 1624 02/26/17 2141 02/27/17 0450 02/27/17 0815 02/27/17 1147  GLUCAP 106* 284* 193* 160* 91   Iron Studies: No results for input(s): IRON, TIBC, TRANSFERRIN, FERRITIN in the last 72 hours. Lab Results  Component Value Date   INR 1.29 01/16/2017   INR 1.35 07/21/2016   INR 1.4 (A) 12/23/2015   INR 1.2 (A) 12/23/2015   PROTIME 17.3 (A) 12/23/2015   PROTIME 15.9 (A) 12/23/2015   Medications: . [START ON 02/28/2017] ferric gluconate (FERRLECIT/NULECIT) IV     . allopurinol  100 mg Oral Q T,Th,Sat-1800  . amLODipine  10 mg Oral QHS  . atorvastatin  40 mg Oral QHS  . calcium acetate  1,334 mg Oral TID WC  . carvedilol  25 mg Oral BID WC  . cloNIDine  0.1 mg Oral Q8H  . colchicine  0.3 mg Oral Daily  . darbepoetin (ARANESP) injection - DIALYSIS  200 mcg Intravenous Q Wed-HD  . feeding supplement (NEPRO CARB STEADY)  237 mL Oral TID BM  . haloperidol  0.5 mg Oral QHS  . [START ON  02/28/2017] heparin  5,000 Units Subcutaneous Q8H  . hydrocortisone   Rectal BID  . insulin aspart  0-15 Units Subcutaneous TID WC  . insulin aspart  5 Units Subcutaneous TID WC  . insulin glargine  13 Units Subcutaneous QHS  . multivitamin  1 tablet Oral QHS  . sertraline  50 mg Oral Daily  . traZODone  25 mg Oral QHS

## 2017-02-27 NOTE — Progress Notes (Signed)
Patient refuses bed alarm, and floor mats. Educated, but continues to refuse stating "the floor mats trip [her] up" bed in lowest position.

## 2017-02-27 NOTE — Progress Notes (Signed)
Initial Nutrition Assessment  DOCUMENTATION CODES:   Non-severe (moderate) malnutrition in context of chronic illness  INTERVENTION:   -Nepro Shake po TID, each supplement provides 425 kcal and 19 grams protein  -Reviewed Renal Diet focusing mainly on protein intake and reducing high potassium foods in diet  NUTRITION DIAGNOSIS:   Malnutrition (Moderate) related to chronic illness (ESRD on HD, CHF) as evidenced by mild depletion of body fat, moderate depletions of muscle mass.  GOAL:   Patient will meet greater than or equal to 90% of their needs  MONITOR:   PO intake, Supplement acceptance, Labs, Weight trends  REASON FOR ASSESSMENT:   Malnutrition Screening Tool    ASSESSMENT:   51 yo female admitted with left leg pain and edema. 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 Pt with hx of hepatitis C, HTN, dyslipidemia, ESRD on HD, CHF, bipolar disorder, anxiety/depression, DM  Pt reports 2-3 months ago she watched "What the Health" food documentary and since then has not been eating meat. Pt reports she cannot stomach the thought of eating meat. Pt reports she hast mostly just been eating veggies and fruit. Reviewed the importance of adequate protein in setting of ESRD on HD. Reviewed sources of protein with pt; pt agreeable to eggs, tuna salad, protein shakes  Pt also acknowledges 20 to 30 pound wt loss in 2-3 months (>/= 12.5% wt loss in 2-3 months which is significant for time frame).    Pt reports potassium has been high as an outpatient, noted to be eating high potassium foods such as tomatoes and potatoes. Reviewed high potassium food list.   Nutrition-Focused physical exam completed. Findings are mild fat depletion, mild/moderate muscle depletion, and mild edema in LLE.   Labs: sodium 132 Meds: phoslo, Rena-Vit, lantus, ss novolog  Diet Order:  Diet renal/carb modified with fluid restriction Diet-HS Snack? Nothing;  Room service appropriate? Yes; Fluid consistency: Thin  Skin:  Reviewed, no issues  Last BM:  7/24  Height:   Ht Readings from Last 1 Encounters:  02/25/17 5\' 4"  (1.626 m)    Weight:   Wt Readings from Last 1 Encounters:  02/26/17 140 lb 4.8 oz (63.6 kg)    Ideal Body Weight:     BMI:  Body mass index is 24.08 kg/m.  Estimated Nutritional Needs:   Kcal:  1900-2100 kcals  Protein:  90-115 g  Fluid:  1000 mL plus UOP  EDUCATION NEEDS:   Education needs addressed  Kerman Passey MS, RD, LDN [phone] Pager  [phone] Weekend/On-Call Pager

## 2017-02-27 NOTE — Progress Notes (Signed)
PROGRESS NOTE    [name]  VEH:[id number] DOB: 25-Sep-1965 DOA: 02/25/2017 PCP: Maurice Small, MD   Chief Complaint  Patient presents with  . Abscess    Brief Narrative:  HPI on 02/25/17 by Dr. Nestor Ramp is a 51 y.o. female with medical history significant of hepatitis C, hypertension, dyslipidemia, ESRD on hemodialysis, chronic combined systolic and diastolic congestive heart failure, bipolar disorder, anxiety/depression, type 2 diabetes presented with worsening left lower extremities pain associated with swelling. Patient was admitted about a month ago for similar problem when you she was found to have left eye phlegmon and had aspiration done. The cultures was negative. She was treated with antibiotics. At that time she was seen by gout when the antibiotics was discontinued and treated with colchicine and allopurinol. Patient reported that she went home but did not get better. Now she reported that the left eye is swollen associated with worsened pain in her left calf for about a week. Patient went to outside facility yesterday and had ultrasound of left lower extremities done when there is no DVT noticed however found to have mixed echogenic fluid similar to prior MRI imaging found. Patient reported occasional chills but denied fever, nausea vomiting headache or dizziness. No chest pain or shortness of breath. Assessment & Plan   Left leg pain and edema, acute on chronic -Patient recently had interventional radiology drain phlegmon in June 2018 -Patient was treated with antibiotics if no improvement, later treated as gout with mild improvement. -Fluid cultures unremarkable -Patient currently with no fever, no leukocytosis -MRI LLE: Abnormal muscle edema in the extensor compartment musculature of the left thigh which may reflect muscle strain versus mild ascites. 2.5 x 4 x 11.2 cm fluid complex collection located between the vastus medialis and lateralis muscles of the mid  thigh most concerning for hematoma versus infection. High-grade partial-thickness tear of the vastus lateralis muscle at the level of the mid thigh with a 2.7 x 1.8 centimeter complex fluid collection at the site of the tear consistent with small hematoma which appears to communicate with the larger collection. Complex fluid collection within the low medial gastroc muscle 2.7 x 4 x 10.9 cm with low signal foci within the fluid collection, concerning for intramuscular hematoma with low signal foci -Approximate Doppler showed mixed echogenic fluid within the bilateral thighs -Interventional radiology consultation appreciated for possible aspiration, plan for aspiration on 02/28/2017 -Continue pain control. Patient related her frustration with not having pain medications when she is discharged as this has happened to her several times in the past. Explained our policy regarding narcotic prescriptions. -CRP and CK WNL, ESR 82 (high)  Vastus lateralis partial-thickness muscle tear -Likely the cause of patient's left leg pain and edema in her thigh -Patient currently not having any neurological deficits -Discussed with Dr. Sharol Given, agreed with outpatient follow-up in a few weeks. No acute intervention.  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 Surgery was not done at that time due to her anemia -Patient does have mild soft tissue swelling which she states has been there for some time -Posterior care everywhere, patient had CT scan of the elbow on 02/24/2017 showing displaced nonunion subcondylar fracture of the distal humerus -Continue pain control -Spoke with Dr.  Sharol Given, orthopedic, who recommended upper  extremity specialist- elbow -Orthopedics PA will evaluate the patient regarding bursa  Essential hypertension -Continue amlodipine, Coreg, clonidine  Chronic hyponatremia -Improved after dialysis, currently 132 -Continue to monitor BMP  Anemia of chronic disease -Patient is a [employer]'s [employer] -Hemoglobin currently 7.6, continue to monitor CBC  Diabetes mellitus, type II -Continue insulin sliding scale CBG monitoring  Hyperlipidemia -Continue statin  DVT Prophylaxis    Code Status: Heparin  Family Communication: None at bedside  Disposition Plan:Observation, pending aspiration of left leg fluid collection on 02/28/2017.  Consultants Nephrology Dr. Sharol Given, via phone Orthopedics Interventional radiology  Procedures  None  Antibiotics   Anti-infectives    None      Subjective:   [name] seen and examined today. Continues to complain of left leg pain and states it's worse in her left calf. Feels that her infection is getting worse. Denies any chest pain, shortness of breath, abdominal pain, nausea or vomiting, headache or dizziness. Feels that no one is paying attention to her because of her drug use last time and wants narcotics when she goes home. Upset that no intervention is being done at this time and threatens to call the news.  Objective:   Vitals:   02/26/17 1626 02/26/17 2156 02/27/17 0720 02/27/17 0905  BP: (!) 163/70 (!) 152/71 (!) 143/56 (!) 156/69  Pulse: 85 82 72 73  Resp: '18 20 18 18  '$ Temp: 98.8 F (37.1 C) 99.2 F (37.3 C) 98 F (36.7 C) 97.8 F (36.6 C)  TempSrc: Oral Oral Oral Oral  SpO2: 96% 97% 97% 100%  Weight:  63.6 kg (140 lb 4.8 oz)    Height:        Intake/Output Summary (Last 24 hours) at 02/27/17 1256 Last data filed at 02/27/17 1000  Gross per 24 hour  Intake              960 ml  Output                0 ml  Net              960 ml   Filed Weights   02/26/17 0700 02/26/17 1053 02/26/17 2156  Weight: 67.5 kg (148 lb 13  oz) 62.6 kg (138 lb 0.1 oz) 63.6 kg (140 lb 4.8 oz)   Exam  General: Well developed, chronically-ill appearing, NAD  HEENT: NCAT, mucous membranes moist.   Cardiovascular: S1 S2 auscultated, RRR, no murmurs  Respiratory: Clear to auscultation bilaterally with equal chest rise  Abdomen: Soft, nontender, nondistended, + bowel sounds  Extremities: warm dry without cyanosis clubbing or edema. Left foot with TMA. Right elbow angulated with large bursa. Left calf TTP.  Neuro: AAOx3, nonfocal  Data Reviewed: I have personally reviewed following labs and imaging studies  CBC:  Recent Labs Lab 02/25/17 1012 02/25/17 2318 02/26/17 0715 02/27/17 0332  WBC 3.9* 3.8* 4.8 3.7*  NEUTROABS 3.0  --   --   --   HGB 8.6* 8.4* 8.1* 7.6*  HCT 27.7* 27.4* 25.3* 24.5*  MCV 87.4 85.6 85.5 86.9  PLT 224 242 242 035   Basic Metabolic Panel:  Recent Labs Lab 02/25/17 1012 02/25/17 1016 02/25/17 2318 02/26/17 0715 02/27/17 0332  NA 126*  --  124* 129* 132*  K 4.6  --  4.1 4.1 4.2  CL 90*  --  90* 97* 96*  CO2 23  --  '25 24 29  '$ GLUCOSE 472*  --  490* 132*  201*  BUN 27*  --  33* 34* 19  CREATININE 5.21*  --  5.97* 6.16* 4.16*  CALCIUM 8.9  --  8.7* 8.7* 8.7*  MG  --  2.6*  --   --   --   PHOS  --  2.7 3.1  --   --    GFR: Estimated Creatinine Clearance: 14 mL/min (A) (by C-G formula based on SCr of 4.16 mg/dL (H)). Liver Function Tests:  Recent Labs Lab 02/25/17 1012 02/25/17 2318  AST 28  --   ALT 15  --   ALKPHOS 112  --   BILITOT 1.2  --   PROT 8.9*  --   ALBUMIN 2.9* 2.6*   No results for input(s): LIPASE, AMYLASE in the last 168 hours. No results for input(s): AMMONIA in the last 168 hours. Coagulation Profile: No results for input(s): INR, PROTIME in the last 168 hours. Cardiac Enzymes:  Recent Labs Lab 02/25/17 1928  CKTOTAL 55   BNP (last 3 results) No results for input(s): PROBNP in the last 8760 hours. HbA1C: No results for input(s): HGBA1C in the last  72 hours. CBG:  Recent Labs Lab 02/26/17 1624 02/26/17 2141 02/27/17 0450 02/27/17 0815 02/27/17 1147  GLUCAP 106* 284* 193* 160* 91   Lipid Profile: No results for input(s): CHOL, HDL, LDLCALC, TRIG, CHOLHDL, LDLDIRECT in the last 72 hours. Thyroid Function Tests: No results for input(s): TSH, T4TOTAL, FREET4, T3FREE, THYROIDAB in the last 72 hours. Anemia Panel: No results for input(s): VITAMINB12, FOLATE, FERRITIN, TIBC, IRON, RETICCTPCT in the last 72 hours. Urine analysis:    Component Value Date/Time   COLORURINE YELLOW 02/25/2017 1224   APPEARANCEUR HAZY (A) 02/25/2017 1224   LABSPEC 1.013 02/25/2017 1224   PHURINE 8.0 02/25/2017 1224   GLUCOSEU >=500 (A) 02/25/2017 1224   HGBUR SMALL (A) 02/25/2017 1224   BILIRUBINUR NEGATIVE 02/25/2017 1224   BILIRUBINUR neg 12/13/2014 1127   KETONESUR NEGATIVE 02/25/2017 1224   PROTEINUR >=300 (A) 02/25/2017 1224   UROBILINOGEN 0.2 05/01/2015 1252   NITRITE NEGATIVE 02/25/2017 1224   LEUKOCYTESUR NEGATIVE 02/25/2017 1224   Sepsis Labs: '@LABRCNTIP'$ (procalcitonin:4,lacticidven:4)  ) Recent Results (from the past 240 hour(s))  Blood culture (routine x 2)     Status: None (Preliminary result)   Collection Time: 02/25/17  7:20 PM  Result Value Ref Range Status   Specimen Description BLOOD RIGHT ANTECUBITAL  Final   Special Requests   Final    BOTTLES DRAWN AEROBIC AND ANAEROBIC Blood Culture adequate volume   Culture NO GROWTH 2 DAYS  Final   Report Status PENDING  Incomplete  Blood culture (routine x 2)     Status: None (Preliminary result)   Collection Time: 02/25/17  7:27 PM  Result Value Ref Range Status   Specimen Description BLOOD RIGHT UPPER ARM  Final   Special Requests   Final    BOTTLES DRAWN AEROBIC AND ANAEROBIC Blood Culture adequate volume   Culture NO GROWTH 2 DAYS  Final   Report Status PENDING  Incomplete  MRSA PCR Screening     Status: None   Collection Time: 02/26/17  1:27 AM  Result Value Ref Range  Status   MRSA by PCR NEGATIVE NEGATIVE Final    Comment:        The GeneXpert MRSA Assay (FDA approved for NASAL specimens only), is one component of a comprehensive MRSA colonization surveillance program. It is not intended to diagnose MRSA infection nor to guide or monitor treatment for MRSA infections.  Radiology Studies: Mr Tibia Fibula Left Wo Contrast  Result Date: 02/27/2017 CLINICAL DATA:  51 y.o. female with medical history significant of hepatitis C, hypertension, dyslipidemia, ESRD on hemodialysis, chronic combined systolic and diastolic congestive heart failure, bipolar disorder, anxiety/depression, type 2 diabetes. Patient presents with worsening left lower extremities pain associated with swelling. Patient was admitted about a month ago for similar problem when you she was found to have left eye phlegmon and had aspiration done. The cultures was negative. She was treated with antibiotics. At that time she was seen by gout when the antibiotics was discontinued and treated with colchicine and allopurinol. Patient reported that she went home but did not get better. Now she reported that the left eye is swollen associated with worsened pain in her left calf for about a week. Patient went to outside facility yesterday and had ultrasound of left lower extremities done when there is no DVT noticed however found to have mixed echogenic fluid similar to prior MRI imaging found. EXAM: MRI OF LOWER LEFT EXTREMITY WITHOUT CONTRAST; MR OF THE LEFT FEMUR WITHOUT CONTRAST TECHNIQUE: Multiplanar, multisequence MR imaging of the left femur and left lower leg was performed. No intravenous contrast was administered. COMPARISON:  01/01/2017, 01/16/2017 MRI left femur Ultrasound left lower extremity 02/24/2017 FINDINGS: Bones/Joint/Cartilage No marrow signal abnormality. No fracture or dislocation. Normal alignment. Large left knee joint effusion. Ligaments Collateral ligaments are intact.  ACL and  PCL are intact. Muscles and Tendons Abnormal muscle edema in the extensor compartment musculature of the left thigh. 2.5 x 4 x 11.2 cm complex fluid collection which is T1 intermediate signal and mildly T2 hyperintense located between the vastus intermedius and vastus lateralis muscles of the mid thigh most concerning for a a hematoma versus infection. High-grade partial-thickness tear of the vastus lateralis muscle at the level of the mid thigh (image 28/series 7) with a 2.7 x 1.8 cm complex fluid collection at the site of the tear consistent with a small hematoma which appears to communicate with the larger collection. Mild generalized muscle atrophy of the gastrocnemius musculature bilaterally. Complex fluid collection within the medial gastrocnemius muscle measuring approximately 2.7 x 4 x 10.9 cm with low signal foci within the fluid collection. Areas of T1 hyperintensity within the fluid collection are noted dependently. The appearance is most concerning for an intramuscular hematoma with the low signal foci likely reflecting hemosiderin versus air secondary to an infected hematoma given the ultrasound findings. Soft tissue No fluid collection or hematoma.  No soft tissue mass. IMPRESSION: 1. Abnormal muscle edema in the extensor compartment musculature of the left thigh which may reflect muscle strain versus myositis. 2.5 x 4 x 11.2 cm complex fluid collection located between the vastus intermedius and vastus lateralis muscles of the mid thigh most concerning for a a hematoma versus infection. High-grade partial-thickness tear of the vastus lateralis muscle at the level of the mid thigh (image 28/series 7) with a 2.7 x 1.8 cm complex fluid collection at the site of the tear consistent with a small hematoma which appears to communicate with the larger collection. 2. Complex fluid collection within the medial gastrocnemius muscle measuring approximately 2.7 x 4 x 10.9 cm with low signal foci within the fluid  collection and areas of T1 hyperintensity within the fluid collection are noted dependently. The appearance is most concerning for an intramuscular hematoma with the low signal foci likely reflecting hemosiderin versus air secondary to an infected hematoma given the ultrasound findings. Aspiration and laboratory analysis may  be helpful given these findings. Electronically Signed   By: Elige Ko   On: 02/27/2017 09:05   Mr Femur Left Wo Contrast  Result Date: 02/27/2017 CLINICAL DATA:  51 y.o. female with medical history significant of hepatitis C, hypertension, dyslipidemia, ESRD on hemodialysis, chronic combined systolic and diastolic congestive heart failure, bipolar disorder, anxiety/depression, type 2 diabetes. Patient presents with worsening left lower extremities pain associated with swelling. Patient was admitted about a month ago for similar problem when you she was found to have left eye phlegmon and had aspiration done. The cultures was negative. She was treated with antibiotics. At that time she was seen by gout when the antibiotics was discontinued and treated with colchicine and allopurinol. Patient reported that she went home but did not get better. Now she reported that the left eye is swollen associated with worsened pain in her left calf for about a week. Patient went to outside facility yesterday and had ultrasound of left lower extremities done when there is no DVT noticed however found to have mixed echogenic fluid similar to prior MRI imaging found. EXAM: MRI OF LOWER LEFT EXTREMITY WITHOUT CONTRAST; MR OF THE LEFT FEMUR WITHOUT CONTRAST TECHNIQUE: Multiplanar, multisequence MR imaging of the left femur and left lower leg was performed. No intravenous contrast was administered. COMPARISON:  01/01/2017, 01/16/2017 MRI left femur Ultrasound left lower extremity 02/24/2017 FINDINGS: Bones/Joint/Cartilage No marrow signal abnormality. No fracture or dislocation. Normal alignment. Large left  knee joint effusion. Ligaments Collateral ligaments are intact.  ACL and PCL are intact. Muscles and Tendons Abnormal muscle edema in the extensor compartment musculature of the left thigh. 2.5 x 4 x 11.2 cm complex fluid collection which is T1 intermediate signal and mildly T2 hyperintense located between the vastus intermedius and vastus lateralis muscles of the mid thigh most concerning for a a hematoma versus infection. High-grade partial-thickness tear of the vastus lateralis muscle at the level of the mid thigh (image 28/series 7) with a 2.7 x 1.8 cm complex fluid collection at the site of the tear consistent with a small hematoma which appears to communicate with the larger collection. Mild generalized muscle atrophy of the gastrocnemius musculature bilaterally. Complex fluid collection within the medial gastrocnemius muscle measuring approximately 2.7 x 4 x 10.9 cm with low signal foci within the fluid collection. Areas of T1 hyperintensity within the fluid collection are noted dependently. The appearance is most concerning for an intramuscular hematoma with the low signal foci likely reflecting hemosiderin versus air secondary to an infected hematoma given the ultrasound findings. Soft tissue No fluid collection or hematoma.  No soft tissue mass. IMPRESSION: 1. Abnormal muscle edema in the extensor compartment musculature of the left thigh which may reflect muscle strain versus myositis. 2.5 x 4 x 11.2 cm complex fluid collection located between the vastus intermedius and vastus lateralis muscles of the mid thigh most concerning for a a hematoma versus infection. High-grade partial-thickness tear of the vastus lateralis muscle at the level of the mid thigh (image 28/series 7) with a 2.7 x 1.8 cm complex fluid collection at the site of the tear consistent with a small hematoma which appears to communicate with the larger collection. 2. Complex fluid collection within the medial gastrocnemius muscle measuring  approximately 2.7 x 4 x 10.9 cm with low signal foci within the fluid collection and areas of T1 hyperintensity within the fluid collection are noted dependently. The appearance is most concerning for an intramuscular hematoma with the low signal foci likely reflecting  hemosiderin versus air secondary to an infected hematoma given the ultrasound findings. Aspiration and laboratory analysis may be helpful given these findings. Electronically Signed   By: Kathreen Devoid   On: 02/27/2017 09:05     Scheduled Meds: . allopurinol  100 mg Oral Q T,Th,Sat-1800  . amLODipine  10 mg Oral QHS  . atorvastatin  40 mg Oral QHS  . calcium acetate  1,334 mg Oral TID WC  . carvedilol  25 mg Oral BID WC  . cloNIDine  0.1 mg Oral Q8H  . colchicine  0.3 mg Oral Daily  . darbepoetin (ARANESP) injection - DIALYSIS  200 mcg Intravenous Q Wed-HD  . feeding supplement (NEPRO CARB STEADY)  237 mL Oral TID BM  . haloperidol  0.5 mg Oral QHS  . heparin  5,000 Units Subcutaneous Q8H  . hydrocortisone   Rectal BID  . insulin aspart  0-15 Units Subcutaneous TID WC  . insulin aspart  5 Units Subcutaneous TID WC  . insulin glargine  13 Units Subcutaneous QHS  . multivitamin  1 tablet Oral QHS  . sertraline  50 mg Oral Daily  . traZODone  25 mg Oral QHS   Continuous Infusions: . [START ON 02/28/2017] ferric gluconate (FERRLECIT/NULECIT) IV       LOS: 0 days   Time Spent in minutes   30 minutes  Vada Yellen D.O. on 02/27/2017 at 12:56 PM  Between 7am to 7pm - Pager - [phone]  After 7pm go to [email or web address] - password [email or web address]  And look for the night coverage person covering for me after hours  [facility] Office  [phone]

## 2017-02-27 NOTE — Progress Notes (Signed)
Patient ID: [name], female   DOB: 1966-08-04, 51 y.o.   MRN: [id number] Spoke to Dr. Ree Kida as well as the patient regarding her MRI imaging.  The fluid collection in her thigh appears smaller and improved from her imaging done in June.  She does have a partial tear of her vastus lateralis which is likely contributing to some of her thigh pain as well.  Dr. Vernard Gambles has reviewed and her imaging and given her imaging is improved and she has no WBC or fever, we do not feel this warranted further aspiration.  However, the patient is still complaining of pain in her left calf.  She has been noted to have a partially calcified Baker's cyst.  Her left calf is edematous and tender to touch.  After further discussion, we will plan to aspirate this collection tomorrow under US guidance.  We would ask the primary service to place this order and indicate if any labs are needed.  Leith Hedlund E 11:17 AM 02/27/2017

## 2017-02-28 DIAGNOSIS — F419 Anxiety disorder, unspecified: Secondary | ICD-10-CM

## 2017-02-28 DIAGNOSIS — F329 Major depressive disorder, single episode, unspecified: Secondary | ICD-10-CM

## 2017-02-28 DIAGNOSIS — M7122 Synovial cyst of popliteal space [Baker], left knee: Secondary | ICD-10-CM

## 2017-02-28 DIAGNOSIS — M7989 Other specified soft tissue disorders: Secondary | ICD-10-CM | POA: Diagnosis not present

## 2017-02-28 DIAGNOSIS — L03116 Cellulitis of left lower limb: Secondary | ICD-10-CM | POA: Diagnosis not present

## 2017-02-28 DIAGNOSIS — E440 Moderate protein-calorie malnutrition: Secondary | ICD-10-CM | POA: Diagnosis not present

## 2017-02-28 DIAGNOSIS — M79605 Pain in left leg: Secondary | ICD-10-CM | POA: Diagnosis not present

## 2017-02-28 DIAGNOSIS — N186 End stage renal disease: Secondary | ICD-10-CM | POA: Diagnosis not present

## 2017-02-28 LAB — BASIC METABOLIC PANEL
Anion gap: 10 (ref 5–15)
BUN: 26 mg/dL — AB (ref 6–20)
CHLORIDE: 93 mmol/L — AB (ref 101–111)
CO2: 28 mmol/L (ref 22–32)
CREATININE: 5.08 mg/dL — AB (ref 0.44–1.00)
Calcium: 8.9 mg/dL (ref 8.9–10.3)
GFR calc non Af Amer: 9 mL/min — ABNORMAL LOW (ref >60)
GFR, EST AFRICAN AMERICAN: 10 mL/min — AB (ref >60)
Glucose, Bld: 249 mg/dL — ABNORMAL HIGH (ref 65–99)
Potassium: 4.4 mmol/L (ref 3.5–5.1)
SODIUM: 131 mmol/L — AB (ref 135–145)

## 2017-02-28 LAB — GLUCOSE, CAPILLARY
GLUCOSE-CAPILLARY: 160 mg/dL — AB (ref 65–99)
Glucose-Capillary: 227 mg/dL — ABNORMAL HIGH (ref 65–99)
Glucose-Capillary: 256 mg/dL — ABNORMAL HIGH (ref 65–99)

## 2017-02-28 MED ORDER — HEPARIN SODIUM (PORCINE) 1000 UNIT/ML DIALYSIS: 1000.0000 [IU] | INTRAMUSCULAR | Status: DC | PRN

## 2017-02-28 MED ORDER — OXYCODONE HCL 10 MG PO TABS: 10.0000 mg | ORAL_TABLET | Freq: Four times a day (QID) | ORAL | 0 refills | Status: DC | PRN

## 2017-02-28 MED ORDER — SODIUM CHLORIDE 0.9 % IV SOLN: 100.0000 mL | INTRAVENOUS | Status: DC | PRN

## 2017-02-28 MED ORDER — PENTAFLUOROPROP-TETRAFLUOROETH EX AERO: 1.0000 "application " | INHALATION_SPRAY | CUTANEOUS | Status: DC | PRN

## 2017-02-28 MED ORDER — LIDOCAINE HCL (PF) 1 % IJ SOLN: 5.0000 mL | INTRAMUSCULAR | Status: DC | PRN

## 2017-02-28 MED ORDER — LIDOCAINE-PRILOCAINE 2.5-2.5 % EX CREA: 1.0000 "application " | TOPICAL_CREAM | CUTANEOUS | Status: DC | PRN

## 2017-02-28 MED ORDER — NEPRO/CARBSTEADY PO LIQD: 237.0000 mL | Freq: Three times a day (TID) | ORAL | 0 refills | Status: DC

## 2017-02-28 MED ORDER — ALTEPLASE 2 MG IJ SOLR: 2.0000 mg | Freq: Once | INTRAMUSCULAR | Status: DC | PRN

## 2017-02-28 NOTE — Consult Note (Signed)
Reason for Consult:Right elbow pain Referring Physician: M Ricca Melgarejo is an 51 y.o. female.  HPI: Trachelle suffered a supercondylar elbow fx on the right late in 2015. By her own report this was not treated though the circumstances are unclear. She developed a significant deformity and nonunion and has been suffering with this ever since. In 2016 she was referred to Dr. Mardelle Matte who saw her several times in clinic. At that time he recommended evaluation at a tertiary institution for consideration of surgical reconstruction as this was likely beyond the scope of anyone in town. Other considerations that argued against surgery were her substance abuse, uncontrolled DM, ESRD on HD, and Jehovah's Witness status. It's unclear if she pursued this.  Past Medical History:  Diagnosis Date  . Anemia   . Anxiety   . Bipolar disorder, unspecified ([place]) 02/06/2014  . Chronic combined systolic and diastolic CHF (congestive heart failure) (H[facility])    EF 40% by echo and 48% by stress test  . Chronic pain syndrome   . Depression   . ESRD on hemodialysis ([place])   . GERD (gastroesophageal reflux disease)   . Glaucoma   . Headache    sinus headaches  . Hepatitis C   . High cholesterol   . History of hiatal hernia   . History of vitamin D deficiency 06/20/2015  . Hypertension   . MRSA infection ?2014   "on the back of my head; spread throughout my bloodstream"  . Neuropathy   . Refusal of blood transfusions as patient is Jehovah's Witness   . Schizoaffective disorder, unspecified condition 02/08/2014  . Seizure [facility]) dx'd 2014   "don't know what kind; last one was ~ 04/2014"  . Stroke [facility])    TIA 2009  . TIA (transient ischemic attack) 2010  . Type II diabetes mellitus ([place])    INSULIN DEPENDENT    Past Surgical History:  Procedure Laterality Date  . ABDOMINAL HYSTERECTOMY  2014  . ABDOMINAL HYSTERECTOMY  2010  . AMPUTATION [name] 05/21/2013   Procedure: [name] Midfoot AMPUTATION;  Surgeon: Newt Minion, MD;  Location: [place];  Service: Orthopedics;  Laterality: [name];  [name] Midfoot Amputation  . AV FISTULA PLACEMENT [name] 05/04/2015   Procedure: ARTERIOVENOUS (AV) GRAFT INSERTION;  Surgeon: Angelia Mould, MD;  Location: [place];  Service: Vascular;  Laterality: [name];  . COLONOSCOPY WITH PROPOFOL N/A 06/22/2013   Procedure: COLONOSCOPY WITH PROPOFOL;  Surgeon: Jeryl Columbia, MD;  Location: [facility] ENDOSCOPY;  Service: Endoscopy;  Laterality: N/A;  . ESOPHAGOGASTRODUODENOSCOPY N/A 05/11/2015   Procedure: ESOPHAGOGASTRODUODENOSCOPY (EGD);  Surgeon: Laurence Spates, MD;  Location: [facility] ENDOSCOPY;  Service: Endoscopy;  Laterality: N/A;  . EYE SURGERY Bilateral 2010   Lasik  . FLEXIBLE SIGMOIDOSCOPY N/A 01/17/2017   Procedure: FLEXIBLE SIGMOIDOSCOPY w/ hemorrhoid banding possible;  Surgeon: Gatha Mayer, MD;  Location: [facility] ENDOSCOPY;  Service: Endoscopy;  Laterality: N/A;  . FOOT AMPUTATION [name]    due diabetic neuropathy, could not feel ulcer on bottom of foot  . REFRACTIVE SURGERY Bilateral 2013  . TONSILLECTOMY  1970's  . TUBAL LIGATION  2000    Family History  Problem Relation Age of Onset  . Hypertension Mother   . Hypertension Father     Social History:  reports that she quit smoking about 23 months ago. Her smoking use included Cigarettes. She has a 20.00 pack-year smoking history. She has never used smokeless tobacco. She reports that she drinks about 1.8 oz of alcohol  per week . She reports that she uses drugs, including "Crack" cocaine and Cocaine.  Allergies:  Allergies  Allergen Reactions  . Penicillins Hives and Other (See Comments)    Has patient had a PCN reaction causing immediate rash, facial/tongue/throat swelling, SOB or lightheadedness with hypotension: Yes Has patient had a PCN reaction causing severe rash involving mucus membranes or skin necrosis: No Has patient had a PCN reaction that required hospitalization No Has patient had a PCN reaction occurring within the  last 10 years: No If all of the above answers are "NO", then may proceed with Cephalosporin use. Pt tolerates cefepime and ceftriaxone  . Gabapentin Itching and Swelling  . Lyrica [Pregabalin] Swelling and Other (See Comments)    Facial and leg swelling  . Saphris [Asenapine] Swelling and Other (See Comments)    Facial swelling  . Tramadol Swelling and Other (See Comments)    Leg swelling  . Other Other (See Comments)    NO BLOOD PRODUCTS. PT IS A JEHOVAH WITNESS    Medications: I have reviewed the patient's current medications.  Results for orders placed or performed during the hospital encounter of 02/25/17 (from the past 48 hour(s))  Glucose, capillary     Status: Abnormal   Collection Time: 02/26/17  4:24 PM  Result Value Ref Range   Glucose-Capillary 106 (H) 65 - 99 mg/dL  Glucose, capillary     Status: Abnormal   Collection Time: 02/26/17  9:41 PM  Result Value Ref Range   Glucose-Capillary 284 (H) 65 - 99 mg/dL  CBC     Status: Abnormal   Collection Time: 02/27/17  3:32 AM  Result Value Ref Range   WBC 3.7 (L) 4.0 - 10.5 K/uL   RBC 2.82 (L) 3.87 - 5.11 MIL/uL   Hemoglobin 7.6 (L) 12.0 - 15.0 g/dL   HCT 24.5 (L) 36.0 - 46.0 %   MCV 86.9 78.0 - 100.0 fL   MCH 27.0 26.0 - 34.0 pg   MCHC 31.0 30.0 - 36.0 g/dL   RDW 16.7 (H) 11.5 - 15.5 %   Platelets 165 150 - 400 K/uL    Comment: REPEATED TO VERIFY PLATELET COUNT CONFIRMED BY SMEAR   Basic metabolic panel     Status: Abnormal   Collection Time: 02/27/17  3:32 AM  Result Value Ref Range   Sodium 132 (L) 135 - 145 mmol/L   Potassium 4.2 3.5 - 5.1 mmol/L   Chloride 96 (L) 101 - 111 mmol/L   CO2 29 22 - 32 mmol/L   Glucose, Bld 201 (H) 65 - 99 mg/dL   BUN 19 6 - 20 mg/dL   Creatinine, Ser 4.16 (H) 0.44 - 1.00 mg/dL   Calcium 8.7 (L) 8.9 - 10.3 mg/dL   GFR calc non Af Amer 12 (L) >60 mL/min   GFR calc Af Amer 13 (L) >60 mL/min    Comment: (NOTE) The eGFR has been calculated using the CKD EPI equation. This  calculation has not been validated in all clinical situations. eGFR's persistently <60 mL/min signify possible Chronic Kidney Disease.    Anion gap 7 5 - 15  Glucose, capillary     Status: Abnormal   Collection Time: 02/27/17  4:50 AM  Result Value Ref Range   Glucose-Capillary 193 (H) 65 - 99 mg/dL  Glucose, capillary     Status: Abnormal   Collection Time: 02/27/17  8:15 AM  Result Value Ref Range   Glucose-Capillary 160 (H) 65 - 99 mg/dL  Glucose, capillary  Status: None   Collection Time: 02/27/17 11:47 AM  Result Value Ref Range   Glucose-Capillary 91 65 - 99 mg/dL  CBC     Status: Abnormal   Collection Time: 02/27/17  1:19 PM  Result Value Ref Range   WBC 3.0 (L) 4.0 - 10.5 K/uL   RBC 2.92 (L) 3.87 - 5.11 MIL/uL   Hemoglobin 7.9 (L) 12.0 - 15.0 g/dL   HCT 25.9 (L) 36.0 - 46.0 %   MCV 88.7 78.0 - 100.0 fL   MCH 27.1 26.0 - 34.0 pg   MCHC 30.5 30.0 - 36.0 g/dL   RDW 17.1 (H) 11.5 - 15.5 %   Platelets 217 150 - 400 K/uL  Glucose, capillary     Status: Abnormal   Collection Time: 02/27/17  4:29 PM  Result Value Ref Range   Glucose-Capillary 106 (H) 65 - 99 mg/dL  Glucose, capillary     Status: Abnormal   Collection Time: 02/27/17  9:05 PM  Result Value Ref Range   Glucose-Capillary 180 (H) 65 - 99 mg/dL  Basic metabolic panel     Status: Abnormal   Collection Time: 02/28/17  4:28 AM  Result Value Ref Range   Sodium 131 (L) 135 - 145 mmol/L   Potassium 4.4 3.5 - 5.1 mmol/L   Chloride 93 (L) 101 - 111 mmol/L   CO2 28 22 - 32 mmol/L   Glucose, Bld 249 (H) 65 - 99 mg/dL   BUN 26 (H) 6 - 20 mg/dL   Creatinine, Ser 5.08 (H) 0.44 - 1.00 mg/dL   Calcium 8.9 8.9 - 10.3 mg/dL   GFR calc non Af Amer 9 (L) >60 mL/min   GFR calc Af Amer 10 (L) >60 mL/min    Comment: (NOTE) The eGFR has been calculated using the CKD EPI equation. This calculation has not been validated in all clinical situations. eGFR's persistently <60 mL/min signify possible Chronic Kidney Disease.     Anion gap 10 5 - 15    Mr [name] [name] Wo Contrast  Result Date: 02/27/2017 CLINICAL DATA:  51 y.o. female with medical history significant of hepatitis C, hypertension, dyslipidemia, ESRD on hemodialysis, chronic combined systolic and diastolic congestive heart failure, bipolar disorder, anxiety/depression, type 2 diabetes. Patient presents with worsening [name] lower extremities pain associated with swelling. Patient was admitted about a month ago for similar problem when you she was found to have [name] eye phlegmon and had aspiration done. The cultures was negative. She was treated with antibiotics. At that time she was seen by gout when the antibiotics was discontinued and treated with colchicine and allopurinol. Patient reported that she went home but did not get better. Now she reported that the [name] eye is swollen associated with worsened pain in her [name] calf for about a week. Patient went to outside facility yesterday and had ultrasound of [name] lower extremities done when there is no DVT noticed however found to have mixed echogenic fluid similar to prior MRI imaging found. EXAM: MRI OF LOWER [name] EXTREMITY WITHOUT CONTRAST; MR OF THE [name] [name] WITHOUT CONTRAST TECHNIQUE: Multiplanar, multisequence MR imaging of the [name] [name] and [name] lower leg was performed. No intravenous contrast was administered. COMPARISON:  01/01/2017, 01/16/2017 MRI [name] [name] Ultrasound [name] lower extremity 02/24/2017 FINDINGS: Bones/Joint/Cartilage No marrow signal abnormality. No fracture or dislocation. Normal alignment. Large [name] knee joint effusion. Ligaments Collateral ligaments are intact.  ACL and PCL are intact. Muscles and Tendons Abnormal muscle edema in the extensor compartment musculature  of the [name] thigh. 2.5 x 4 x 11.2 cm complex fluid collection which is T1 intermediate signal and mildly T2 hyperintense located between the vastus intermedius and vastus lateralis muscles of the mid thigh most concerning for  a a hematoma versus infection. High-grade partial-thickness tear of the vastus lateralis muscle at the level of the mid thigh (image 28/series 7) with a 2.7 x 1.8 cm complex fluid collection at the site of the tear consistent with a small hematoma which appears to communicate with the larger collection. Mild generalized muscle atrophy of the gastrocnemius musculature bilaterally. Complex fluid collection within the medial gastrocnemius muscle measuring approximately 2.7 x 4 x 10.9 cm with low signal foci within the fluid collection. Areas of T1 hyperintensity within the fluid collection are noted dependently. The appearance is most concerning for an intramuscular hematoma with the low signal foci likely reflecting hemosiderin versus air secondary to an infected hematoma given the ultrasound findings. Soft tissue No fluid collection or hematoma.  No soft tissue mass. IMPRESSION: 1. Abnormal muscle edema in the extensor compartment musculature of the [name] thigh which may reflect muscle strain versus myositis. 2.5 x 4 x 11.2 cm complex fluid collection located between the vastus intermedius and vastus lateralis muscles of the mid thigh most concerning for a a hematoma versus infection. High-grade partial-thickness tear of the vastus lateralis muscle at the level of the mid thigh (image 28/series 7) with a 2.7 x 1.8 cm complex fluid collection at the site of the tear consistent with a small hematoma which appears to communicate with the larger collection. 2. Complex fluid collection within the medial gastrocnemius muscle measuring approximately 2.7 x 4 x 10.9 cm with low signal foci within the fluid collection and areas of T1 hyperintensity within the fluid collection are noted dependently. The appearance is most concerning for an intramuscular hematoma with the low signal foci likely reflecting hemosiderin versus air secondary to an infected hematoma given the ultrasound findings. Aspiration and laboratory analysis may  be helpful given these findings. Electronically Signed   By: Kathreen Devoid   On: 02/27/2017 09:05   Mr [name] [name] Wo Contrast  Result Date: 02/27/2017 CLINICAL DATA:  51 y.o. female with medical history significant of hepatitis C, hypertension, dyslipidemia, ESRD on hemodialysis, chronic combined systolic and diastolic congestive heart failure, bipolar disorder, anxiety/depression, type 2 diabetes. Patient presents with worsening [name] lower extremities pain associated with swelling. Patient was admitted about a month ago for similar problem when you she was found to have [name] eye phlegmon and had aspiration done. The cultures was negative. She was treated with antibiotics. At that time she was seen by gout when the antibiotics was discontinued and treated with colchicine and allopurinol. Patient reported that she went home but did not get better. Now she reported that the [name] eye is swollen associated with worsened pain in her [name] calf for about a week. Patient went to outside facility yesterday and had ultrasound of [name] lower extremities done when there is no DVT noticed however found to have mixed echogenic fluid similar to prior MRI imaging found. EXAM: MRI OF LOWER [name] EXTREMITY WITHOUT CONTRAST; MR OF THE [name] [name] WITHOUT CONTRAST TECHNIQUE: Multiplanar, multisequence MR imaging of the [name] [name] and [name] lower leg was performed. No intravenous contrast was administered. COMPARISON:  01/01/2017, 01/16/2017 MRI [name] [name] Ultrasound [name] lower extremity 02/24/2017 FINDINGS: Bones/Joint/Cartilage No marrow signal abnormality. No fracture or dislocation. Normal alignment. Large [name] knee joint effusion. Ligaments Collateral ligaments are intact.  ACL and PCL are intact. Muscles and Tendons Abnormal muscle edema in the extensor compartment musculature of the [name] thigh. 2.5 x 4 x 11.2 cm complex fluid collection which is T1 intermediate signal and mildly T2 hyperintense located between the vastus  intermedius and vastus lateralis muscles of the mid thigh most concerning for a a hematoma versus infection. High-grade partial-thickness tear of the vastus lateralis muscle at the level of the mid thigh (image 28/series 7) with a 2.7 x 1.8 cm complex fluid collection at the site of the tear consistent with a small hematoma which appears to communicate with the larger collection. Mild generalized muscle atrophy of the gastrocnemius musculature bilaterally. Complex fluid collection within the medial gastrocnemius muscle measuring approximately 2.7 x 4 x 10.9 cm with low signal foci within the fluid collection. Areas of T1 hyperintensity within the fluid collection are noted dependently. The appearance is most concerning for an intramuscular hematoma with the low signal foci likely reflecting hemosiderin versus air secondary to an infected hematoma given the ultrasound findings. Soft tissue No fluid collection or hematoma.  No soft tissue mass. IMPRESSION: 1. Abnormal muscle edema in the extensor compartment musculature of the [name] thigh which may reflect muscle strain versus myositis. 2.5 x 4 x 11.2 cm complex fluid collection located between the vastus intermedius and vastus lateralis muscles of the mid thigh most concerning for a a hematoma versus infection. High-grade partial-thickness tear of the vastus lateralis muscle at the level of the mid thigh (image 28/series 7) with a 2.7 x 1.8 cm complex fluid collection at the site of the tear consistent with a small hematoma which appears to communicate with the larger collection. 2. Complex fluid collection within the medial gastrocnemius muscle measuring approximately 2.7 x 4 x 10.9 cm with low signal foci within the fluid collection and areas of T1 hyperintensity within the fluid collection are noted dependently. The appearance is most concerning for an intramuscular hematoma with the low signal foci likely reflecting hemosiderin versus air secondary to an infected  hematoma given the ultrasound findings. Aspiration and laboratory analysis may be helpful given these findings. Electronically Signed   By: Kathreen Devoid   On: 02/27/2017 09:05    Review of Systems  Constitutional: Negative for weight loss.  HENT: Negative for ear discharge, ear pain, hearing loss and tinnitus.   Eyes: Negative for blurred vision, double vision, photophobia and pain.  Respiratory: Negative for cough, sputum production and shortness of breath.   Cardiovascular: Negative for chest pain.  Gastrointestinal: Negative for abdominal pain, nausea and vomiting.  Genitourinary: Negative for dysuria, flank pain, frequency and urgency.  Musculoskeletal: Positive for joint pain (Right elbow) and myalgias ([name] lower extremity). Negative for back pain, falls and neck pain.  Neurological: Negative for dizziness, tingling, sensory change, focal weakness, loss of consciousness and headaches.  Endo/Heme/Allergies: Does not bruise/bleed easily.  Psychiatric/Behavioral: Negative for depression, memory loss and substance abuse. The patient is not nervous/anxious.    Blood pressure (!) 166/81, pulse 76, temperature 98.1 F (36.7 C), temperature source Oral, resp. rate 18, height 5' 4" (1.626 m), weight 62.9 kg (138 lb 10.7 oz), SpO2 99 %. Physical Exam  Constitutional: She appears well-developed and well-nourished. No distress.  HENT:  Head: Normocephalic.  Eyes: Conjunctivae are normal. Right eye exhibits no discharge. [name] eye exhibits no discharge. No scleral icterus.  Cardiovascular: Normal rate and regular rhythm.   Respiratory: Effort normal. No respiratory distress.  Musculoskeletal:  Right shoulder, elbow, wrist, digits- no skin  wounds, mild-mod TTP elbow, no TTP enlarged bursa, no instability, some decrease in motion  Sens  Ax/R/M/U intact  Mot   Ax/ R/ PIN/ M/ AIN/ U intact  Rad 2+   Skin: Skin is warm and dry. She is not diaphoretic.  Psychiatric: She has a normal mood and  affect. Her behavior is normal.    Assessment/Plan: Right supracondylar elbow fx with nonunion -- She will need referral to orthopedics at a tertiary care/academic center. Her bursa is nontender and I do not think aspiration will benefit the patient while putting her at risk for septic bursitis. Unfortunately options are very limited for this patient's chronic [name] elbow pain. Please call if you have any questions.    Lisette Abu, PA-C Orthopedic Surgery [phone] 02/28/2017, 12:23 PM
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
## 2017-02-28 NOTE — Progress Notes (Signed)
Discussed with pt again the MRI findings again of her left leg and that all the swelling in the upper and lower leg we feel are likely due to her fall and associated muscle tear in the L upper leg, w/ subsequent hematoma in the L thigh and also in the left calf.  I told her the hematomas should resolve over the next few months, that are no signs of active infection in the leg, that the orthopedic MD will follow this in OP setting and that I wouldn't recommend any more invasive procedures and suggested we cancel the IR procedure for today to "drain the fluid".  She has agreed to cancel the procedure.   Kelly Splinter MD [employer] pgr [phone]   02/28/2017, 12:42 PM

## 2017-02-28 NOTE — Progress Notes (Signed)
PROGRESS NOTE    MARIGRACE MCCOLE  PJK:[id number] DOB: 06/26/1966 DOA: 02/25/2017 PCP: Maurice Small, MD   Chief Complaint  Patient presents with  . Abscess    Brief Narrative:  HPI on 02/25/17 by Dr. Nestor Ramp is a 51 y.o. female with medical history significant of hepatitis C, hypertension, dyslipidemia, ESRD on hemodialysis, chronic combined systolic and diastolic congestive heart failure, bipolar disorder, anxiety/depression, type 2 diabetes presented with worsening left lower extremities pain associated with swelling. Patient was admitted about a month ago for similar problem when you she was found to have left eye phlegmon and had aspiration done. The cultures was negative. She was treated with antibiotics. At that time she was seen by gout when the antibiotics was discontinued and treated with colchicine and allopurinol. Patient reported that she went home but did not get better. Now she reported that the left eye is swollen associated with worsened pain in her left calf for about a week. Patient went to outside facility yesterday and had ultrasound of left lower extremities done when there is no DVT noticed however found to have mixed echogenic fluid similar to prior MRI imaging found. Patient reported occasional chills but denied fever, nausea vomiting headache or dizziness. No chest pain or shortness of breath. Assessment & Plan   Left leg pain and edema, acute on chronic -Patient recently had interventional radiology drain phlegmon in June 2018 -Patient was treated with antibiotics if no improvement, later treated as gout with mild improvement. -Fluid cultures unremarkable -Patient currently with no fever, no leukocytosis -MRI LLE: Abnormal muscle edema in the extensor compartment musculature of the left thigh which may reflect muscle strain versus mild ascites. 2.5 x 4 x 11.2 cm fluid complex collection located between the vastus medialis and lateralis muscles of the mid  thigh most concerning for hematoma versus infection. High-grade partial-thickness tear of the vastus lateralis muscle at the level of the mid thigh with a 2.7 x 1.8 centimeter complex fluid collection at the site of the tear consistent with small hematoma which appears to communicate with the larger collection. Complex fluid collection within the low medial gastroc muscle 2.7 x 4 x 10.9 cm with low signal foci within the fluid collection, concerning for intramuscular hematoma with low signal foci -Approximate Doppler showed mixed echogenic fluid within the bilateral thighs -Interventional radiology consultation appreciated for possible aspiration, plan for aspiration on 02/28/2017 -Continue pain control. Patient related her frustration with not having pain medications when she is discharged as this has happened to her several times in the past. Explained our policy regarding narcotic prescriptions. -CRP and CK WNL, ESR 82 (high) -Suspect pain is secondary to blood collection/hematoma from muscle tear   Vastus lateralis partial-thickness muscle tear -Likely the cause of patient's left leg pain and edema in her thigh -Patient currently not having any neurological deficits -Discussed with Dr. Sharol Given, agreed with outpatient follow-up in a few weeks. No acute intervention. 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 Surgery was not done at that time due to her anemia -Patient does have mild soft tissue swelling which she states has been there for some time -Posterior care everywhere, patient had CT  scan of the elbow on 02/24/2017 showing displaced nonunion  subcondylar fracture of the distal humerus -Continue pain control -Spoke with Dr. Sharol Given, orthopedic, who recommended upper extremity specialist- elbow -Pending eval by Orthopedics PA regarding bursa -Patient very upset that her right elbow was not taken care of 2 years ago. Threatens to call the news and speak with head of the hospital. States she is characterized as a drug abuser and no one wants to help her.   Essential hypertension -Continue amlodipine, Coreg, clonidine  Chronic hyponatremia -Improved after dialysis, currently 131 -Continue to monitor BMP  Anemia of chronic disease -Patient is a [employer]'s [employer] -Hemoglobin currently 7.9, continue to monitor CBC  Diabetes mellitus, type II -Continue insulin sliding scale CBG monitoring  Hyperlipidemia -Continue statin  DVT Prophylaxis  Heparin  Code Status: Full  Family Communication: None at bedside  Disposition Plan:Observation, pending aspiration of left leg fluid collection today 02/28/2017.  Consultants Nephrology Dr. Sharol Given, via phone Orthopedics Interventional radiology  Procedures  None  Antibiotics   Anti-infectives    None      Subjective:   Naleyah Ohlinger seen and examined today. Continues to complain of left thigh and calf pain. States she feels that something is in there and it needs to be drained. Requests transfer to [facility]. Feels she is not being taken care of because of her drug abuse in the past. Currently denies chest pain, shortness of breath, palpitations, abdominal pain, nausea or vomiting, diarrhea or constipation, headache or dizziness.  Objective:   Vitals:   02/28/17 0800 02/28/17 0805 02/28/17 0830 02/28/17 0900  BP: (!) 173/84 (!) 158/64 (!) 166/79 (!) 179/83  Pulse: 78 79 82 79  Resp: _0 Temp: (!) 97.2 F (36.2 C)     TempSrc: Oral     SpO2:      Weight:      Height:        Intake/Output Summary (Last 24 hours) at 02/28/17 1050 Last data filed at 02/28/17 1000  Gross  per 24 hour  Intake              720 ml  Output                0 ml  Net              720 ml   Filed Weights   02/26/17 2156 02/27/17 2108 02/28/17 0750  Weight: 63.6 kg (140 lb 4.8 oz) 63.5 kg (140 lb) 64.9 kg (143 lb 1.3 oz)   Exam  General: Well developed, Chronically ill appearing   HEENT: NCAT, mucous membranes moist.   Cardiovascular: S1 S2 auscultated, no murmurs, RRR  Respiratory: Clear to auscultation bilaterally with equal chest rise  Abdomen: Soft, nontender, nondistended, + bowel sounds  Extremities: Left calf with mild edema and TTP. Left foot TMA. Right elbow angulated with large bursa.   Neuro: AAOx3, nonfocal  Psych: Distrusting   Data Reviewed: I have personally reviewed following labs and imaging studies  CBC:  Recent Labs Lab 02/25/17 1012 02/25/17 2318 02/26/17 0715 02/27/17 0332 02/27/17 1319  WBC 3.9* 3.8* 4.8 3.7* 3.0*  NEUTROABS 3.0  --   --   --   --   HGB 8.6* 8.4* 8.1* 7.6* 7.9*  HCT 27.7* 27.4* 25.3* 24.5* 25.9*  MCV 87.4 85.6 85.5 86.9 88.7  PLT 224 242 242 165 564   Basic Metabolic Panel:  Recent Labs Lab 02/25/17 1012 02/25/17 1016 02/25/17 2318 02/26/17 0715  02/27/17 0332 02/28/17 0428  NA 126*  --  124* 129* 132* 131*  K 4.6  --  4.1 4.1 4.2 4.4  CL 90*  --  90* 97* 96* 93*  CO2 23  --  _0 GLUCOSE 472*  --  490* 132* 201* 249*  BUN 27*  --  33* 34* 19 26*  CREATININE 5.21*  --  5.97* 6.16* 4.16* 5.08*  CALCIUM 8.9  --  8.7* 8.7* 8.7* 8.9  MG  --  2.6*  --   --   --   --   PHOS  --  2.7 3.1  --   --   --    GFR: Estimated Creatinine Clearance: 11.4 mL/min (A) (by C-G formula based on SCr of 5.08 mg/dL (H)). Liver Function Tests:  Recent Labs Lab 02/25/17 1012 02/25/17 2318  AST 28  --   ALT 15  --   ALKPHOS 112  --   BILITOT 1.2  --   PROT 8.9*  --   ALBUMIN 2.9* 2.6*   No results for input(s): LIPASE, AMYLASE in the last 168 hours. No results for input(s): AMMONIA in the last 168  hours. Coagulation Profile: No results for input(s): INR, PROTIME in the last 168 hours. Cardiac Enzymes:  Recent Labs Lab 02/25/17 1928  CKTOTAL 55   BNP (last 3 results) No results for input(s): PROBNP in the last 8760 hours. HbA1C: No results for input(s): HGBA1C in the last 72 hours. CBG:  Recent Labs Lab 02/27/17 0450 02/27/17 0815 02/27/17 1147 02/27/17 1629 02/27/17 2105  GLUCAP 193* 160* 91 106* 180*   Lipid Profile: No results for input(s): CHOL, HDL, LDLCALC, TRIG, CHOLHDL, LDLDIRECT in the last 72 hours. Thyroid Function Tests: No results for input(s): TSH, T4TOTAL, FREET4, T3FREE, THYROIDAB in the last 72 hours. Anemia Panel: No results for input(s): VITAMINB12, FOLATE, FERRITIN, TIBC, IRON, RETICCTPCT in the last 72 hours. Urine analysis:    Component Value Date/Time   COLORURINE YELLOW 02/25/2017 1224   APPEARANCEUR HAZY (A) 02/25/2017 1224   LABSPEC 1.013 02/25/2017 1224   PHURINE 8.0 02/25/2017 1224   GLUCOSEU >=500 (A) 02/25/2017 1224   HGBUR SMALL (A) 02/25/2017 1224   BILIRUBINUR NEGATIVE 02/25/2017 1224   BILIRUBINUR neg 12/13/2014 1127   KETONESUR NEGATIVE 02/25/2017 1224   PROTEINUR >=300 (A) 02/25/2017 1224   UROBILINOGEN 0.2 05/01/2015 1252   NITRITE NEGATIVE 02/25/2017 1224   LEUKOCYTESUR NEGATIVE 02/25/2017 1224   Sepsis Labs: _1 (procalcitonin:4,lacticidven:4)  ) Recent Results (from the past 240 hour(s))  Blood culture (routine x 2)     Status: None (Preliminary result)   Collection Time: 02/25/17  7:20 PM  Result Value Ref Range Status   Specimen Description BLOOD RIGHT ANTECUBITAL  Final   Special Requests   Final    BOTTLES DRAWN AEROBIC AND ANAEROBIC Blood Culture adequate volume   Culture NO GROWTH 2 DAYS  Final   Report Status PENDING  Incomplete  Blood culture (routine x 2)     Status: None (Preliminary result)   Collection Time: 02/25/17  7:27 PM  Result Value Ref Range Status   Specimen Description BLOOD RIGHT  UPPER ARM  Final   Special Requests   Final    BOTTLES DRAWN AEROBIC AND ANAEROBIC Blood Culture adequate volume   Culture NO GROWTH 2 DAYS  Final   Report Status PENDING  Incomplete  MRSA PCR Screening     Status: None   Collection Time: 02/26/17  1:27 AM  Result Value Ref Range Status   MRSA by PCR NEGATIVE NEGATIVE Final    Comment:        The GeneXpert MRSA Assay (FDA approved for NASAL specimens only), is one component of a comprehensive MRSA colonization surveillance program. It is not intended to diagnose MRSA infection nor to guide or monitor treatment for MRSA infections.       Radiology Studies: Mr Tibia Fibula Left Wo Contrast  Result Date: 02/27/2017 CLINICAL DATA:  51 y.o. female with medical history significant of hepatitis C, hypertension, dyslipidemia, ESRD on hemodialysis, chronic combined systolic and diastolic congestive heart failure, bipolar disorder, anxiety/depression, type 2 diabetes. Patient presents with worsening left lower extremities pain associated with swelling. Patient was admitted about a month ago for similar problem when you she was found to have left eye phlegmon and had aspiration done. The cultures was negative. She was treated with antibiotics. At that time she was seen by gout when the antibiotics was discontinued and treated with colchicine and allopurinol. Patient reported that she went home but did not get better. Now she reported that the left eye is swollen associated with worsened pain in her left calf for about a week. Patient went to outside facility yesterday and had ultrasound of left lower extremities done when there is no DVT noticed however found to have mixed echogenic fluid similar to prior MRI imaging found. EXAM: MRI OF LOWER LEFT EXTREMITY WITHOUT CONTRAST; MR OF THE LEFT FEMUR WITHOUT CONTRAST TECHNIQUE: Multiplanar, multisequence MR imaging of the left femur and left lower leg was performed. No intravenous contrast was  administered. COMPARISON:  01/01/2017, 01/16/2017 MRI left femur Ultrasound left lower extremity 02/24/2017 FINDINGS: Bones/Joint/Cartilage No marrow signal abnormality. No fracture or dislocation. Normal alignment. Large left knee joint effusion. Ligaments Collateral ligaments are intact.  ACL and PCL are intact. Muscles and Tendons Abnormal muscle edema in the extensor compartment musculature of the left thigh. 2.5 x 4 x 11.2 cm complex fluid collection which is T1 intermediate signal and mildly T2 hyperintense located between the vastus intermedius and vastus lateralis muscles of the mid thigh most concerning for a a hematoma versus infection. High-grade partial-thickness tear of the vastus lateralis muscle at the level of the mid thigh (image 28/series 7) with a 2.7 x 1.8 cm complex fluid collection at the site of the tear consistent with a small hematoma which appears to communicate with the larger collection. Mild generalized muscle atrophy of the gastrocnemius musculature bilaterally. Complex fluid collection within the medial gastrocnemius muscle measuring approximately 2.7 x 4 x 10.9 cm with low signal foci within the fluid collection. Areas of T1 hyperintensity within the fluid collection are noted dependently. The appearance is most concerning for an intramuscular hematoma with the low signal foci likely reflecting hemosiderin versus air secondary to an infected hematoma given the ultrasound findings. Soft tissue No fluid collection or hematoma.  No soft tissue mass. IMPRESSION: 1. Abnormal muscle edema in the extensor compartment musculature of the left thigh which may reflect muscle strain versus myositis. 2.5 x 4 x 11.2 cm complex fluid collection located between the vastus intermedius and vastus lateralis muscles of the mid thigh most concerning for a a hematoma versus infection. High-grade partial-thickness tear of the vastus lateralis muscle at the level of the mid thigh (image 28/series 7) with a  2.7 x 1.8 cm complex fluid collection at the site of the tear consistent with a small hematoma which appears to communicate with the larger collection. 2. Complex fluid  collection within the medial gastrocnemius muscle measuring approximately 2.7 x 4 x 10.9 cm with low signal foci within the fluid collection and areas of T1 hyperintensity within the fluid collection are noted dependently. The appearance is most concerning for an intramuscular hematoma with the low signal foci likely reflecting hemosiderin versus air secondary to an infected hematoma given the ultrasound findings. Aspiration and laboratory analysis may be helpful given these findings. Electronically Signed   By: Kathreen Devoid   On: 02/27/2017 09:05   Mr Femur Left Wo Contrast  Result Date: 02/27/2017 CLINICAL DATA:  51 y.o. female with medical history significant of hepatitis C, hypertension, dyslipidemia, ESRD on hemodialysis, chronic combined systolic and diastolic congestive heart failure, bipolar disorder, anxiety/depression, type 2 diabetes. Patient presents with worsening left lower extremities pain associated with swelling. Patient was admitted about a month ago for similar problem when you she was found to have left eye phlegmon and had aspiration done. The cultures was negative. She was treated with antibiotics. At that time she was seen by gout when the antibiotics was discontinued and treated with colchicine and allopurinol. Patient reported that she went home but did not get better. Now she reported that the left eye is swollen associated with worsened pain in her left calf for about a week. Patient went to outside facility yesterday and had ultrasound of left lower extremities done when there is no DVT noticed however found to have mixed echogenic fluid similar to prior MRI imaging found. EXAM: MRI OF LOWER LEFT EXTREMITY WITHOUT CONTRAST; MR OF THE LEFT FEMUR WITHOUT CONTRAST TECHNIQUE: Multiplanar, multisequence MR imaging of the  left femur and left lower leg was performed. No intravenous contrast was administered. COMPARISON:  01/01/2017, 01/16/2017 MRI left femur Ultrasound left lower extremity 02/24/2017 FINDINGS: Bones/Joint/Cartilage No marrow signal abnormality. No fracture or dislocation. Normal alignment. Large left knee joint effusion. Ligaments Collateral ligaments are intact.  ACL and PCL are intact. Muscles and Tendons Abnormal muscle edema in the extensor compartment musculature of the left thigh. 2.5 x 4 x 11.2 cm complex fluid collection which is T1 intermediate signal and mildly T2 hyperintense located between the vastus intermedius and vastus lateralis muscles of the mid thigh most concerning for a a hematoma versus infection. High-grade partial-thickness tear of the vastus lateralis muscle at the level of the mid thigh (image 28/series 7) with a 2.7 x 1.8 cm complex fluid collection at the site of the tear consistent with a small hematoma which appears to communicate with the larger collection. Mild generalized muscle atrophy of the gastrocnemius musculature bilaterally. Complex fluid collection within the medial gastrocnemius muscle measuring approximately 2.7 x 4 x 10.9 cm with low signal foci within the fluid collection. Areas of T1 hyperintensity within the fluid collection are noted dependently. The appearance is most concerning for an intramuscular hematoma with the low signal foci likely reflecting hemosiderin versus air secondary to an infected hematoma given the ultrasound findings. Soft tissue No fluid collection or hematoma.  No soft tissue mass. IMPRESSION: 1. Abnormal muscle edema in the extensor compartment musculature of the left thigh which may reflect muscle strain versus myositis. 2.5 x 4 x 11.2 cm complex fluid collection located between the vastus intermedius and vastus lateralis muscles of the mid thigh most concerning for a a hematoma versus infection. High-grade partial-thickness tear of the vastus  lateralis muscle at the level of the mid thigh (image 28/series 7) with a 2.7 x 1.8 cm complex fluid collection at the site of the  tear consistent with a small hematoma which appears to communicate with the larger collection. 2. Complex fluid collection within the medial gastrocnemius muscle measuring approximately 2.7 x 4 x 10.9 cm with low signal foci within the fluid collection and areas of T1 hyperintensity within the fluid collection are noted dependently. The appearance is most concerning for an intramuscular hematoma with the low signal foci likely reflecting hemosiderin versus air secondary to an infected hematoma given the ultrasound findings. Aspiration and laboratory analysis may be helpful given these findings. Electronically Signed   By: Kathreen Devoid   On: 02/27/2017 09:05     Scheduled Meds: . allopurinol  100 mg Oral Q T,Th,Sat-1800  . amLODipine  10 mg Oral QHS  . atorvastatin  40 mg Oral QHS  . calcium acetate  1,334 mg Oral TID WC  . carvedilol  25 mg Oral BID WC  . cloNIDine  0.1 mg Oral Q8H  . colchicine  0.3 mg Oral Daily  . darbepoetin (ARANESP) injection - DIALYSIS  200 mcg Intravenous Q Wed-HD  . feeding supplement (NEPRO CARB STEADY)  237 mL Oral TID BM  . haloperidol  0.5 mg Oral QHS  . heparin  5,000 Units Subcutaneous Q8H  . hydrocortisone   Rectal BID  . insulin aspart  0-15 Units Subcutaneous TID WC  . insulin aspart  5 Units Subcutaneous TID WC  . insulin glargine  13 Units Subcutaneous QHS  . multivitamin  1 tablet Oral QHS  . sertraline  50 mg Oral Daily  . traZODone  25 mg Oral QHS   Continuous Infusions: . sodium chloride    . sodium chloride    . ferric gluconate (FERRLECIT/NULECIT) IV       LOS: 0 days   Time Spent in minutes   30 minutes  Zadkiel Dragan D.O. on 02/28/2017 at 10:50 AM  Between 7am to 7pm - Pager - [phone]  After 7pm go to [email or web address] - password [email or web address]  And look for the night coverage person covering for me after  hours  [facility] Office  [phone]

## 2017-02-28 NOTE — Discharge Summary (Signed)
Physician Discharge Summary  [name] PTW:[id number] DOB: 12/19/1965 DOA: 02/25/2017  PCP: Maurice Small, MD  Admit date: 02/25/2017 Discharge date: 02/28/2017  Time spent: 45 minutes  Recommendations for Outpatient Follow-up:  Patient will be discharged to home.  Patient will need to follow up with primary care provider within one week of discharge.  Continue scheduled dialysis. Follow-up with Dr. Sharol Given, orthopedic surgeon in 2-3 weeks. Patient should continue medications as prescribed.  Patient should follow a Renal/carb modified diet.   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 Consuella Lose a 51 y.o.femalewith medical history significant of hepatitis C, hypertension, dyslipidemia, ESRD on hemodialysis, chronic combined systolic and diastolic congestive heart failure, bipolar disorder, anxiety/depression, type 2 diabetes presented with worsening left lower extremities pain associated with swelling. Patient was admitted about a month ago for similar problem when you she was found to have left eye phlegmon and had aspiration done. The cultures was negative. She was treated with antibiotics. At that time she was seen by gout when the antibiotics was discontinued and treated with colchicine and allopurinol. Patient reported that she went home but did  not get better. Now she reported that the left eye is swollen associated with worsened pain in her left calf for about a week. Patient went to outside facility yesterday and had ultrasound of left lower extremities done when there is no DVT noticed however found to have mixed echogenic fluid similar to prior MRI imaging found. Patient reported occasional chills but denied fever, nausea vomiting headache or dizziness. No chest pain or shortness of breath.  Hospital Course:  Left leg pain and edema, acute on chronic -Patient recently had interventional radiology drain phlegmon in June 2018 -Patient was treated with antibiotics if no improvement, later treated as gout with mild improvement. -Fluid cultures unremarkable -Patient currently with no fever, no leukocytosis -MRI LLE: Abnormal muscle edema in the extensor compartment musculature of the left thigh which may reflect muscle strain versus mild ascites. 2.5 x 4 x 11.2 cm fluid complex collection located between the vastus medialis and lateralis muscles of the mid thigh most concerning for hematoma versus infection. High-grade partial-thickness tear of the vastus lateralis muscle at the level of the mid thigh with a 2.7 x 1.8 centimeter complex fluid collection at the site of the tear consistent with small hematoma which appears to communicate with the larger collection. Complex fluid collection within the low medial gastroc muscle 2.7 x 4 x 10.9 cm with low signal foci within the fluid collection, concerning for intramuscular hematoma with low signal foci -Approximate Doppler showed mixed echogenic fluid within the bilateral thighs -Continue pain control. Patient related her frustration with not having pain medications when she is discharged as this has happened to her several times in the past. Explained our policy regarding narcotic prescriptions. -CRP and CK WNL, ESR 82 (high) -Suspect pain is secondary to blood collection/hematoma from muscle tear   -  Interventional radiology was consulted for possible aspiration. However Dr. Jonnie Finner, nephrology, spoke with patient about no intervention as he feels that her pain is likely secondary to blood collection instead of there being an actual abscess or area to aspirate. Patient agreed, plans for aspiration or canceled.  Vastus lateralis partial-thickness muscle tear -Likely the cause of patient's left leg pain and edema in her thigh -Patient currently not having any neurological deficits -Discussed with Dr. Sharol Given, agreed with outpatient follow-up in a few weeks. No acute intervention. 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 Surgery was not done at that time due to her anemia -Patient does have mild soft tissue swelling which she states has been there for some time -Posterior care everywhere, patient had CT scan of the elbow on 02/24/2017 showing displaced nonunion subcondylar fracture of the distal humerus -Continue pain control -Spoke with Dr. Sharol Given, orthopedic, who recommended upper extremity specialist- elbow -Patient very upset that her right elbow was not taken care of 2 years ago. Threatens to call the news and speak with head of the hospital. States she is characterized as a drug abuser and no one wants to help her.  -Consulted orthopedics for possible evaluation of patient's bursa. Unfortunately, aspiration of this could lead to infection. -Discussed patient's right elbow fracture with orthopedics. Patient was seen by Dr. Mardelle Matte in 2016 several times. It was explained to patient why operative management was unable to be done at  that time given her multiple comorbidities. Patient was referred to a tertiary center. -Upon review of care everywhere, patient has seen orthopedics at [facility] however I am unable to obtain those records and office notes.  Essential hypertension -Continue amlodipine, Coreg, clonidine  Chronic hyponatremia -Improved after dialysis, currently 131  Anemia of chronic disease -Patient is a Jehovah's Witness -Hemoglobin currently 7.9  Diabetes mellitus, type II -Continue home medications on discharge: Lantus and novolog  Hyperlipidemia -Continue statin  Anxiety/depression -Continue Zoloft, Haldol, Ativan as needed  Moderate malnutrition -Continue nutritional supplements  Consultants Nephrology Dr. Sharol Given, via phone Orthopedics Interventional radiology  Procedures  None  Discharge Exam: Vitals:   02/28/17 1100 02/28/17 1130  BP: (!) 169/83 (!) 166/81  Pulse: 85 76  Resp: 19 18  Temp:  98.1 F (36.7 C)     General: Well developed, well nourished, NAD, appears stated age  HEENT: NCAT, PERRLA, EOMI, Anicteic Sclera, mucous membranes moist.  Neck: Supple, no JVD, no masses  Cardiovascular: S1 S2 auscultated, no rubs, murmurs or gallops. Regular rate and rhythm.  Respiratory: Clear to auscultation bilaterally with equal chest rise  Abdomen: Soft, nontender, nondistended, + bowel sounds  Extremities: warm dry without cyanosis clubbing or edema  Neuro: AAOx3, cranial nerves grossly intact. Strength 5/5 in patient's upper and lower extremities bilaterally  Skin: Without rashes exudates or nodules  Psych: Normal affect and demeanor with intact judgement and insight  Discharge Instructions Discharge Instructions    Discharge instructions    Complete by:  As directed    Patient will be discharged to home.  Patient will need to follow up with primary care provider within one week of discharge.  Continue scheduled dialysis. Follow-up with  Dr.  Sharol Given, orthopedic surgeon in 2-3 weeks. Patient should continue medications as prescribed.  Patient should follow a Renal/carb modified diet.     Current Discharge Medication List    START taking these medications   Details  Nutritional Supplements (FEEDING SUPPLEMENT, NEPRO CARB STEADY,) LIQD Take 237 mLs by mouth 3 (three) times daily between meals. Qty: 90 Can, Refills: 0      CONTINUE these medications which have CHANGED   Details  Oxycodone HCl 10 MG TABS Take 1 tablet (10 mg total) by mouth every 6 (six) hours as needed. Qty: 10 tablet, Refills: 0      CONTINUE these medications which have NOT CHANGED   Details  albuterol (PROVENTIL HFA;VENTOLIN HFA) 108 (90 Base) MCG/ACT inhaler Inhale 2 puffs into the lungs every 4 (four) hours as needed for wheezing or shortness of breath. Qty: 1 Inhaler, Refills: 0    allopurinol (ZYLOPRIM) 100 MG tablet Take 1 tablet (100 mg total) by mouth every Tuesday, Thursday, and Saturday at 6 PM. Qty: 60 tablet, Refills: 1    amLODipine (NORVASC) 10 MG tablet Take 1 tablet (10 mg total) by mouth at bedtime. Qty: 30 tablet, Refills: 0    atorvastatin (LIPITOR) 40 MG tablet Take 1 tablet (40 mg total) by mouth at bedtime. Qty: 30 tablet, Refills: 0    calcium acetate (PHOSLO) 667 MG capsule Take 2 capsules (1,334 mg total) by mouth 3 (three) times daily with meals. Qty: 240 capsule, Refills: 0    carvedilol (COREG) 25 MG tablet Take 1 tablet (25 mg total) by mouth 2 (two) times daily with a meal. Qty: 60 tablet, Refills: 0    cloNIDine (CATAPRES) 0.1 MG tablet Take 1 tablet (0.1 mg total) by mouth every 8 (eight) hours. Qty: 90 tablet, Refills: 11    haloperidol (HALDOL) 0.5 MG tablet Take 1 tablet (0.5 mg total) by mouth every 8 (eight) hours as needed for agitation. Qty: 30 tablet, Refills: 0    hydrocortisone (ANUSOL-HC) 2.5 % rectal cream Place rectally 2 (two) times daily. Qty: 30 g, Refills: 0    insulin aspart (NOVOLOG) 100  UNIT/ML injection Inject 5 Units into the skin 3 (three) times daily with meals. Qty: 5 mL, Refills: 0    insulin glargine (LANTUS) 100 UNIT/ML injection Inject 0.13 mLs (13 Units total) into the skin at bedtime. Qty: 7 mL, Refills: 0    multivitamin (RENA-VIT) TABS tablet Take 1 tablet by mouth at bedtime. Qty: 30 tablet, Refills: 0    sertraline (ZOLOFT) 50 MG tablet Take 1 tablet (50 mg total) by mouth daily. Qty: 30 tablet, Refills: 0    traZODone (DESYREL) 50 MG tablet Take 0.5 tablets (25 mg total) by mouth at bedtime. Qty: 30 tablet, Refills: 0    LORazepam (ATIVAN) 2 MG/ML injection Inject 1 mL (2 mg total) into the vein Every Tuesday,Thursday,and Saturday with dialysis. Qty: 13 mL, Refills: 0      STOP taking these medications     colchicine 0.6 MG tablet        Allergies  Allergen Reactions  . Penicillins Hives and Other (See Comments)    Has patient had a PCN reaction causing immediate rash, facial/tongue/throat swelling, SOB or lightheadedness with hypotension: Yes Has patient had a PCN reaction causing severe rash involving mucus membranes or skin necrosis: No Has patient had a PCN reaction that required hospitalization No Has patient had a PCN reaction occurring within the last 10 years: No If all of the above answers are "  NO", then may proceed with Cephalosporin use. Pt tolerates cefepime and ceftriaxone  . Gabapentin Itching and Swelling  . Lyrica [Pregabalin] Swelling and Other (See Comments)    Facial and leg swelling  . Saphris [Asenapine] Swelling and Other (See Comments)    Facial swelling  . Tramadol Swelling and Other (See Comments)    Leg swelling  . Other Other (See Comments)    NO BLOOD PRODUCTS. PT IS A JEHOVAH WITNESS      The results of significant diagnostics from this hospitalization (including imaging, microbiology, ancillary and laboratory) are listed below for reference.    Significant Diagnostic Studies: Dg Chest 2 View  Result  Date: 02/25/2017 CLINICAL DATA:  Weakness. Left lower extremity pain. Left lower extremity abscess. EXAM: CHEST  2 VIEW COMPARISON:  Two-view chest x-ray 01/20/2017 FINDINGS: Heart is enlarged. Mild edema is now present. Small bilateral pleural effusions are present. Minimal bibasilar airspace disease likely reflects atelectasis. The upper lung fields are clear. The visualized soft tissues and bony thorax are unremarkable. IMPRESSION: 1. Cardiomegaly and mild edema compatible with congestive heart failure. 2. Minimal bibasilar airspace disease likely reflects atelectasis. Electronically Signed   By: San Morelle M.D.   On: 02/25/2017 10:55   Dg Elbow Complete Right  Result Date: 02/25/2017 CLINICAL DATA:  51 year old female with pain and swelling right elbow. Injury from fall 2 years ago untreated. Initial encounter. EXAM: RIGHT ELBOW - COMPLETE 3+ VIEW COMPARISON:  None. 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 Surrounding soft tissue swelling which may indicate effusion although infection or hematoma not excluded. Radial head slightly irregular and remains aligned with the capitellum fracture fragment. Olecranon trochlea groove widening and aligned with the trochlea fracture fragment. Question soft tissue defect anterior elbow joint. 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 Surrounding soft tissue swelling which may indicate effusion although infection or hematoma not excluded. Radial head slightly irregular and remains aligned with the capitellum fracture fragment. Olecranon trochlea groove widening and aligned with the trochlea fracture fragment. Electronically Signed   By: Genia Del M.D.   On: 02/25/2017 10:57   Mr Tibia Fibula Left Wo Contrast  Result Date: 02/27/2017 CLINICAL DATA:  51 y.o. female with medical  history significant of hepatitis C, hypertension, dyslipidemia, ESRD on hemodialysis, chronic combined systolic and diastolic congestive heart failure, bipolar disorder, anxiety/depression, type 2 diabetes. Patient presents with worsening left lower extremities pain associated with swelling. Patient was admitted about a month ago for similar problem when you she was found to have left eye phlegmon and had aspiration done. The cultures was negative. She was treated with antibiotics. At that time she was seen by gout when the antibiotics was discontinued and treated with colchicine and allopurinol. Patient reported that she went home but did not get better. Now she reported that the left eye is swollen associated with worsened pain in her left calf for about a week. Patient went to outside facility yesterday and had ultrasound of left lower extremities done when there is no DVT noticed however found to have mixed echogenic fluid similar to prior MRI imaging found. EXAM: MRI OF LOWER LEFT EXTREMITY WITHOUT CONTRAST; MR OF THE LEFT FEMUR WITHOUT CONTRAST TECHNIQUE: Multiplanar, multisequence MR imaging of the left femur and left lower leg was performed. No intravenous contrast was administered. COMPARISON:  01/01/2017, 01/16/2017 MRI left femur Ultrasound left lower extremity 02/24/2017 FINDINGS:  Bones/Joint/Cartilage No marrow signal abnormality. No fracture or dislocation. Normal alignment. Large left knee joint effusion. Ligaments Collateral ligaments are intact.  ACL and PCL are intact. Muscles and Tendons Abnormal muscle edema in the extensor compartment musculature of the left thigh. 2.5 x 4 x 11.2 cm complex fluid collection which is T1 intermediate signal and mildly T2 hyperintense located between the vastus intermedius and vastus lateralis muscles of the mid thigh most concerning for a a hematoma versus infection. High-grade partial-thickness tear of the vastus lateralis muscle at the level of the mid thigh  (image 28/series 7) with a 2.7 x 1.8 cm complex fluid collection at the site of the tear consistent with a small hematoma which appears to communicate with the larger collection. Mild generalized muscle atrophy of the gastrocnemius musculature bilaterally. Complex fluid collection within the medial gastrocnemius muscle measuring approximately 2.7 x 4 x 10.9 cm with low signal foci within the fluid collection. Areas of T1 hyperintensity within the fluid collection are noted dependently. The appearance is most concerning for an intramuscular hematoma with the low signal foci likely reflecting hemosiderin versus air secondary to an infected hematoma given the ultrasound findings. Soft tissue No fluid collection or hematoma.  No soft tissue mass. IMPRESSION: 1. Abnormal muscle edema in the extensor compartment musculature of the left thigh which may reflect muscle strain versus myositis. 2.5 x 4 x 11.2 cm complex fluid collection located between the vastus intermedius and vastus lateralis muscles of the mid thigh most concerning for a a hematoma versus infection. High-grade partial-thickness tear of the vastus lateralis muscle at the level of the mid thigh (image 28/series 7) with a 2.7 x 1.8 cm complex fluid collection at the site of the tear consistent with a small hematoma which appears to communicate with the larger collection. 2. Complex fluid collection within the medial gastrocnemius muscle measuring approximately 2.7 x 4 x 10.9 cm with low signal foci within the fluid collection and areas of T1 hyperintensity within the fluid collection are noted dependently. The appearance is most concerning for an intramuscular hematoma with the low signal foci likely reflecting hemosiderin versus air secondary to an infected hematoma given the ultrasound findings. Aspiration and laboratory analysis may be helpful given these findings. Electronically Signed   By: Elige Ko   On: 02/27/2017 09:05   Mr Femur Left Wo  Contrast  Result Date: 02/27/2017 CLINICAL DATA:  51 y.o. female with medical history significant of hepatitis C, hypertension, dyslipidemia, ESRD on hemodialysis, chronic combined systolic and diastolic congestive heart failure, bipolar disorder, anxiety/depression, type 2 diabetes. Patient presents with worsening left lower extremities pain associated with swelling. Patient was admitted about a month ago for similar problem when you she was found to have left eye phlegmon and had aspiration done. The cultures was negative. She was treated with antibiotics. At that time she was seen by gout when the antibiotics was discontinued and treated with colchicine and allopurinol. Patient reported that she went home but did not get better. Now she reported that the left eye is swollen associated with worsened pain in her left calf for about a week. Patient went to outside facility yesterday and had ultrasound of left lower extremities done when there is no DVT noticed however found to have mixed echogenic fluid similar to prior MRI imaging found. EXAM: MRI OF LOWER LEFT EXTREMITY WITHOUT CONTRAST; MR OF THE LEFT FEMUR WITHOUT CONTRAST TECHNIQUE: Multiplanar, multisequence MR imaging of the left femur and left lower leg was performed. No  intravenous contrast was administered. COMPARISON:  01/01/2017, 01/16/2017 MRI left femur Ultrasound left lower extremity 02/24/2017 FINDINGS: Bones/Joint/Cartilage No marrow signal abnormality. No fracture or dislocation. Normal alignment. Large left knee joint effusion. Ligaments Collateral ligaments are intact.  ACL and PCL are intact. Muscles and Tendons Abnormal muscle edema in the extensor compartment musculature of the left thigh. 2.5 x 4 x 11.2 cm complex fluid collection which is T1 intermediate signal and mildly T2 hyperintense located between the vastus intermedius and vastus lateralis muscles of the mid thigh most concerning for a a hematoma versus infection. High-grade  partial-thickness tear of the vastus lateralis muscle at the level of the mid thigh (image 28/series 7) with a 2.7 x 1.8 cm complex fluid collection at the site of the tear consistent with a small hematoma which appears to communicate with the larger collection. Mild generalized muscle atrophy of the gastrocnemius musculature bilaterally. Complex fluid collection within the medial gastrocnemius muscle measuring approximately 2.7 x 4 x 10.9 cm with low signal foci within the fluid collection. Areas of T1 hyperintensity within the fluid collection are noted dependently. The appearance is most concerning for an intramuscular hematoma with the low signal foci likely reflecting hemosiderin versus air secondary to an infected hematoma given the ultrasound findings. Soft tissue No fluid collection or hematoma.  No soft tissue mass. IMPRESSION: 1. Abnormal muscle edema in the extensor compartment musculature of the left thigh which may reflect muscle strain versus myositis. 2.5 x 4 x 11.2 cm complex fluid collection located between the vastus intermedius and vastus lateralis muscles of the mid thigh most concerning for a a hematoma versus infection. High-grade partial-thickness tear of the vastus lateralis muscle at the level of the mid thigh (image 28/series 7) with a 2.7 x 1.8 cm complex fluid collection at the site of the tear consistent with a small hematoma which appears to communicate with the larger collection. 2. Complex fluid collection within the medial gastrocnemius muscle measuring approximately 2.7 x 4 x 10.9 cm with low signal foci within the fluid collection and areas of T1 hyperintensity within the fluid collection are noted dependently. The appearance is most concerning for an intramuscular hematoma with the low signal foci likely reflecting hemosiderin versus air secondary to an infected hematoma given the ultrasound findings. Aspiration and laboratory analysis may be helpful given these findings.  Electronically Signed   By: Kathreen Devoid   On: 02/27/2017 09:05   Dg Femur Min 2 Views Left  Result Date: 02/25/2017 CLINICAL DATA:  Leg pain and swelling EXAM: LEFT FEMUR 2 VIEWS COMPARISON:  01/16/2017 FINDINGS: Soft tissue swelling in the distal thigh region. No acute bony abnormality. Specifically, no fracture, subluxation, or dislocation. Soft tissues are intact. No visible joint effusion within the left knee. Joint spaces are maintained. Vascular calcifications noted. IMPRESSION: No acute bony abnormality. Electronically Signed   By: Rolm Baptise M.D.   On: 02/25/2017 10:57    Microbiology: Recent Results (from the past 240 hour(s))  Blood culture (routine x 2)     Status: None (Preliminary result)   Collection Time: 02/25/17  7:20 PM  Result Value Ref Range Status   Specimen Description BLOOD RIGHT ANTECUBITAL  Final   Special Requests   Final    BOTTLES DRAWN AEROBIC AND ANAEROBIC Blood Culture adequate volume   Culture NO GROWTH 3 DAYS  Final   Report Status PENDING  Incomplete  Blood culture (routine x 2)     Status: None (Preliminary result)   Collection Time:  02/25/17  7:27 PM  Result Value Ref Range Status   Specimen Description BLOOD RIGHT UPPER ARM  Final   Special Requests   Final    BOTTLES DRAWN AEROBIC AND ANAEROBIC Blood Culture adequate volume   Culture NO GROWTH 3 DAYS  Final   Report Status PENDING  Incomplete  MRSA PCR Screening     Status: None   Collection Time: 02/26/17  1:27 AM  Result Value Ref Range Status   MRSA by PCR NEGATIVE NEGATIVE Final    Comment:        The GeneXpert MRSA Assay (FDA approved for NASAL specimens only), is one component of a comprehensive MRSA colonization surveillance program. It is not intended to diagnose MRSA infection nor to guide or monitor treatment for MRSA infections.      Labs: Basic Metabolic Panel:  Recent Labs Lab 02/25/17 1012 02/25/17 1016 02/25/17 2318 02/26/17 0715 02/27/17 0332 02/28/17 0428    NA 126*  --  124* 129* 132* 131*  K 4.6  --  4.1 4.1 4.2 4.4  CL 90*  --  90* 97* 96* 93*  CO2 23  --  '25 24 29 28  '$ GLUCOSE 472*  --  490* 132* 201* 249*  BUN 27*  --  33* 34* 19 26*  CREATININE 5.21*  --  5.97* 6.16* 4.16* 5.08*  CALCIUM 8.9  --  8.7* 8.7* 8.7* 8.9  MG  --  2.6*  --   --   --   --   PHOS  --  2.7 3.1  --   --   --    Liver Function Tests:  Recent Labs Lab 02/25/17 1012 02/25/17 2318  AST 28  --   ALT 15  --   ALKPHOS 112  --   BILITOT 1.2  --   PROT 8.9*  --   ALBUMIN 2.9* 2.6*   No results for input(s): LIPASE, AMYLASE in the last 168 hours. No results for input(s): AMMONIA in the last 168 hours. CBC:  Recent Labs Lab 02/25/17 1012 02/25/17 2318 02/26/17 0715 02/27/17 0332 02/27/17 1319  WBC 3.9* 3.8* 4.8 3.7* 3.0*  NEUTROABS 3.0  --   --   --   --   HGB 8.6* 8.4* 8.1* 7.6* 7.9*  HCT 27.7* 27.4* 25.3* 24.5* 25.9*  MCV 87.4 85.6 85.5 86.9 88.7  PLT 224 242 242 165 217   Cardiac Enzymes:  Recent Labs Lab 02/25/17 1928  CKTOTAL 55   BNP: BNP (last 3 results)  Recent Labs  03/03/16 1228 12/31/16 2350  BNP 1,298.5* 4,207.8*    ProBNP (last 3 results) No results for input(s): PROBNP in the last 8760 hours.  CBG:  Recent Labs Lab 02/27/17 0815 02/27/17 1147 02/27/17 1629 02/27/17 2105 02/28/17 1252  GLUCAP 160* 91 106* 180* 160*       Signed:  Jillien Yakel  [facility] 02/28/2017, 1:47 PM

## 2017-02-28 NOTE — Procedures (Signed)
MRI of thigh shows ruptured thigh muscle w/ hematoma, and MRI left lower leg shows more hematoma. Suspect this is all traumatic and the lower leg swelling is either dependent blood from upper leg draining per cavity, or additional trauma to the gastroc on the L.  No fevers here, have d/w pt the MRI findings.    I was present at this dialysis session, have reviewed the session itself and made  appropriate changes Kelly Splinter MD [place] pager [phone]   02/28/2017, 9:34 AM

## 2017-03-01 DIAGNOSIS — L03116 Cellulitis of left lower limb: Secondary | ICD-10-CM | POA: Diagnosis not present

## 2017-03-01 LAB — GLUCOSE, CAPILLARY: Glucose-Capillary: 323 mg/dL — ABNORMAL HIGH (ref 65–99)
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
## 2017-03-01 NOTE — Progress Notes (Signed)
Patient was discharged this morning with instructions given on medications,and follow up visits,patient verbalized understanding. Patient was informed to pick up medication from Pharmacy of choice documented on AVS. Patient was also provided with resources for "homeless issues" information was given to patient by CSW. Patient was provided a cane, and was also given a bus pass. Asked if patient understood the discharge instructions, verbalized understanding,with no further questions. Vital signs stable. Accompanied by security and [employer] out the facility.

## 2017-03-02 LAB — CULTURE, BLOOD (ROUTINE X 2)
CULTURE: NO GROWTH
Culture: NO GROWTH
SPECIAL REQUESTS: ADEQUATE
Special Requests: ADEQUATE

## 2017-03-02 IMAGING — CR DG CHEST 2V
2 series · 2 of 2 positions shown · non-contrast
Comparison: 09/17/2015

CLINICAL DATA: Chest pain, shortness of breath for 1 week

EXAM:
CHEST  2 VIEW

[chest lat]
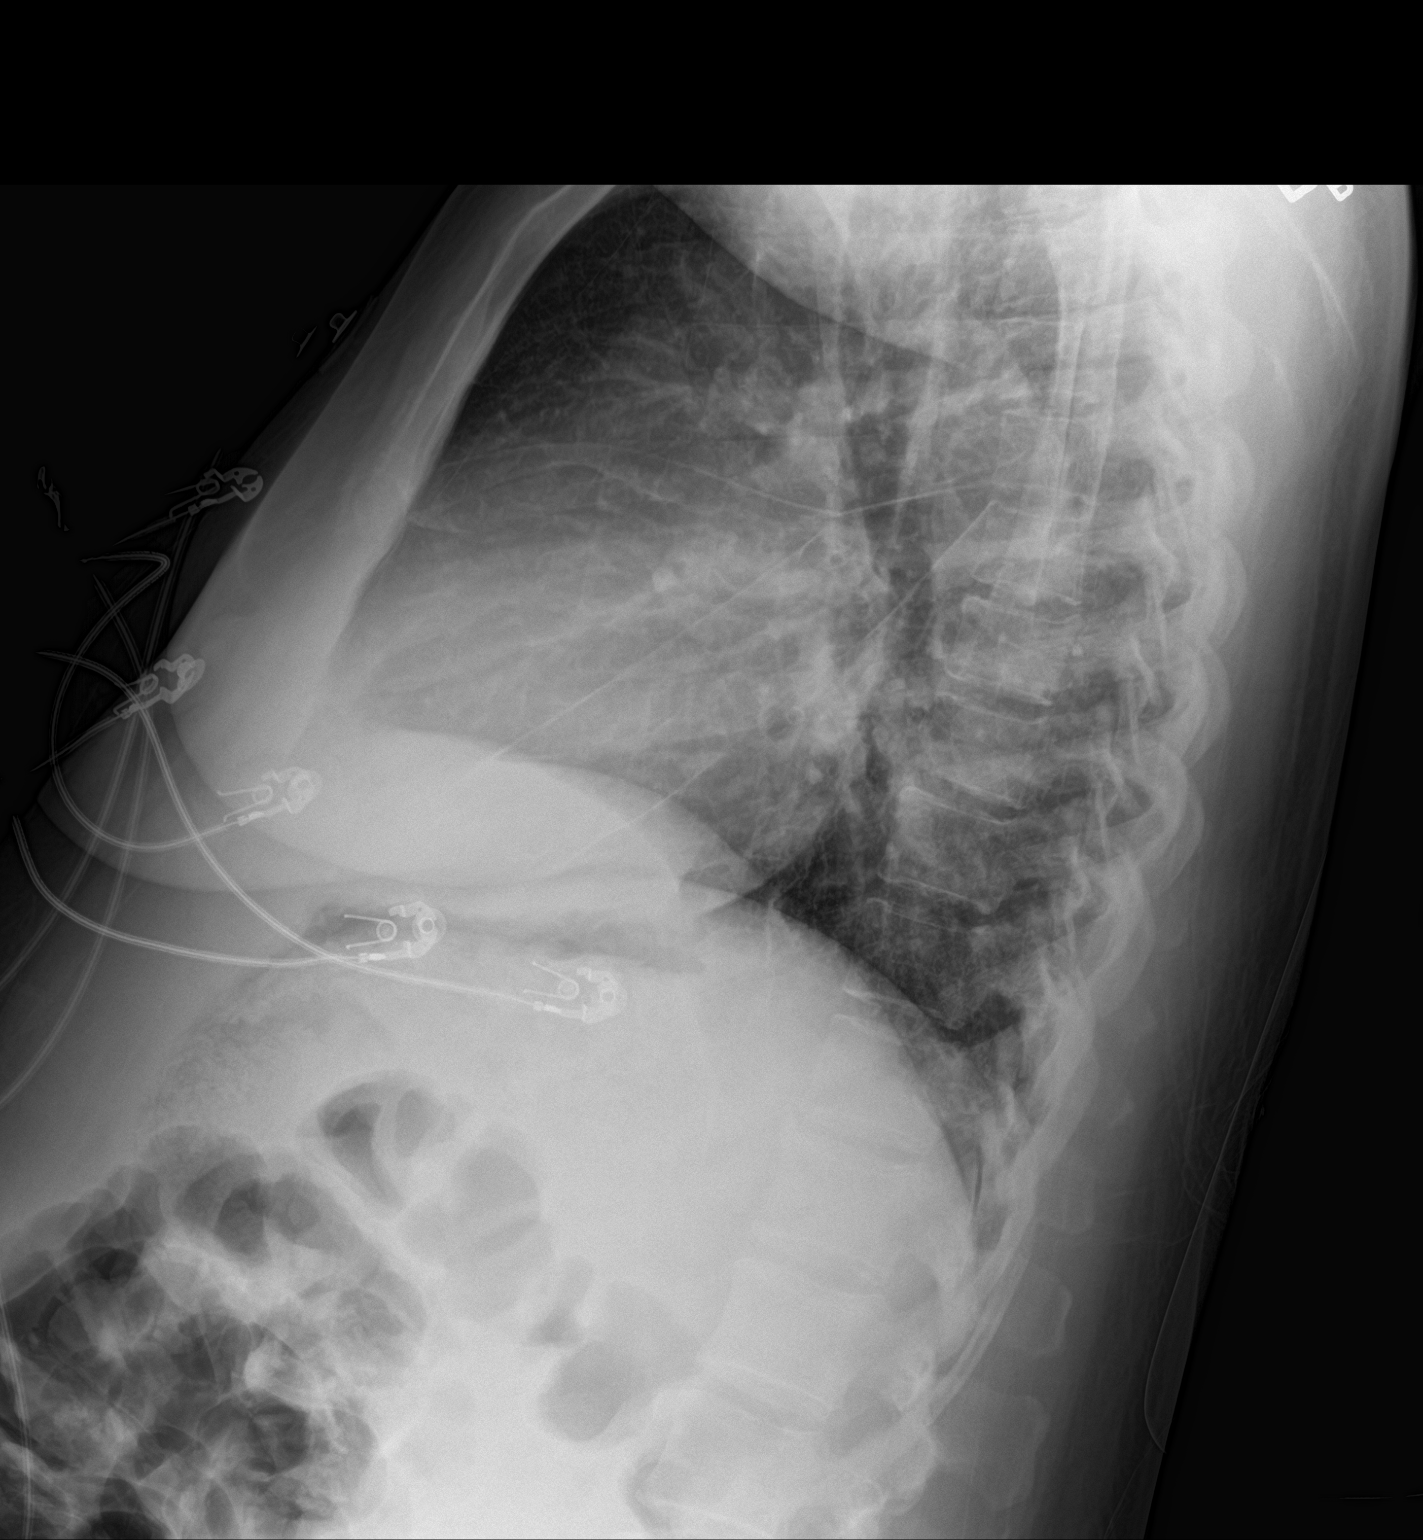

[chest ap]
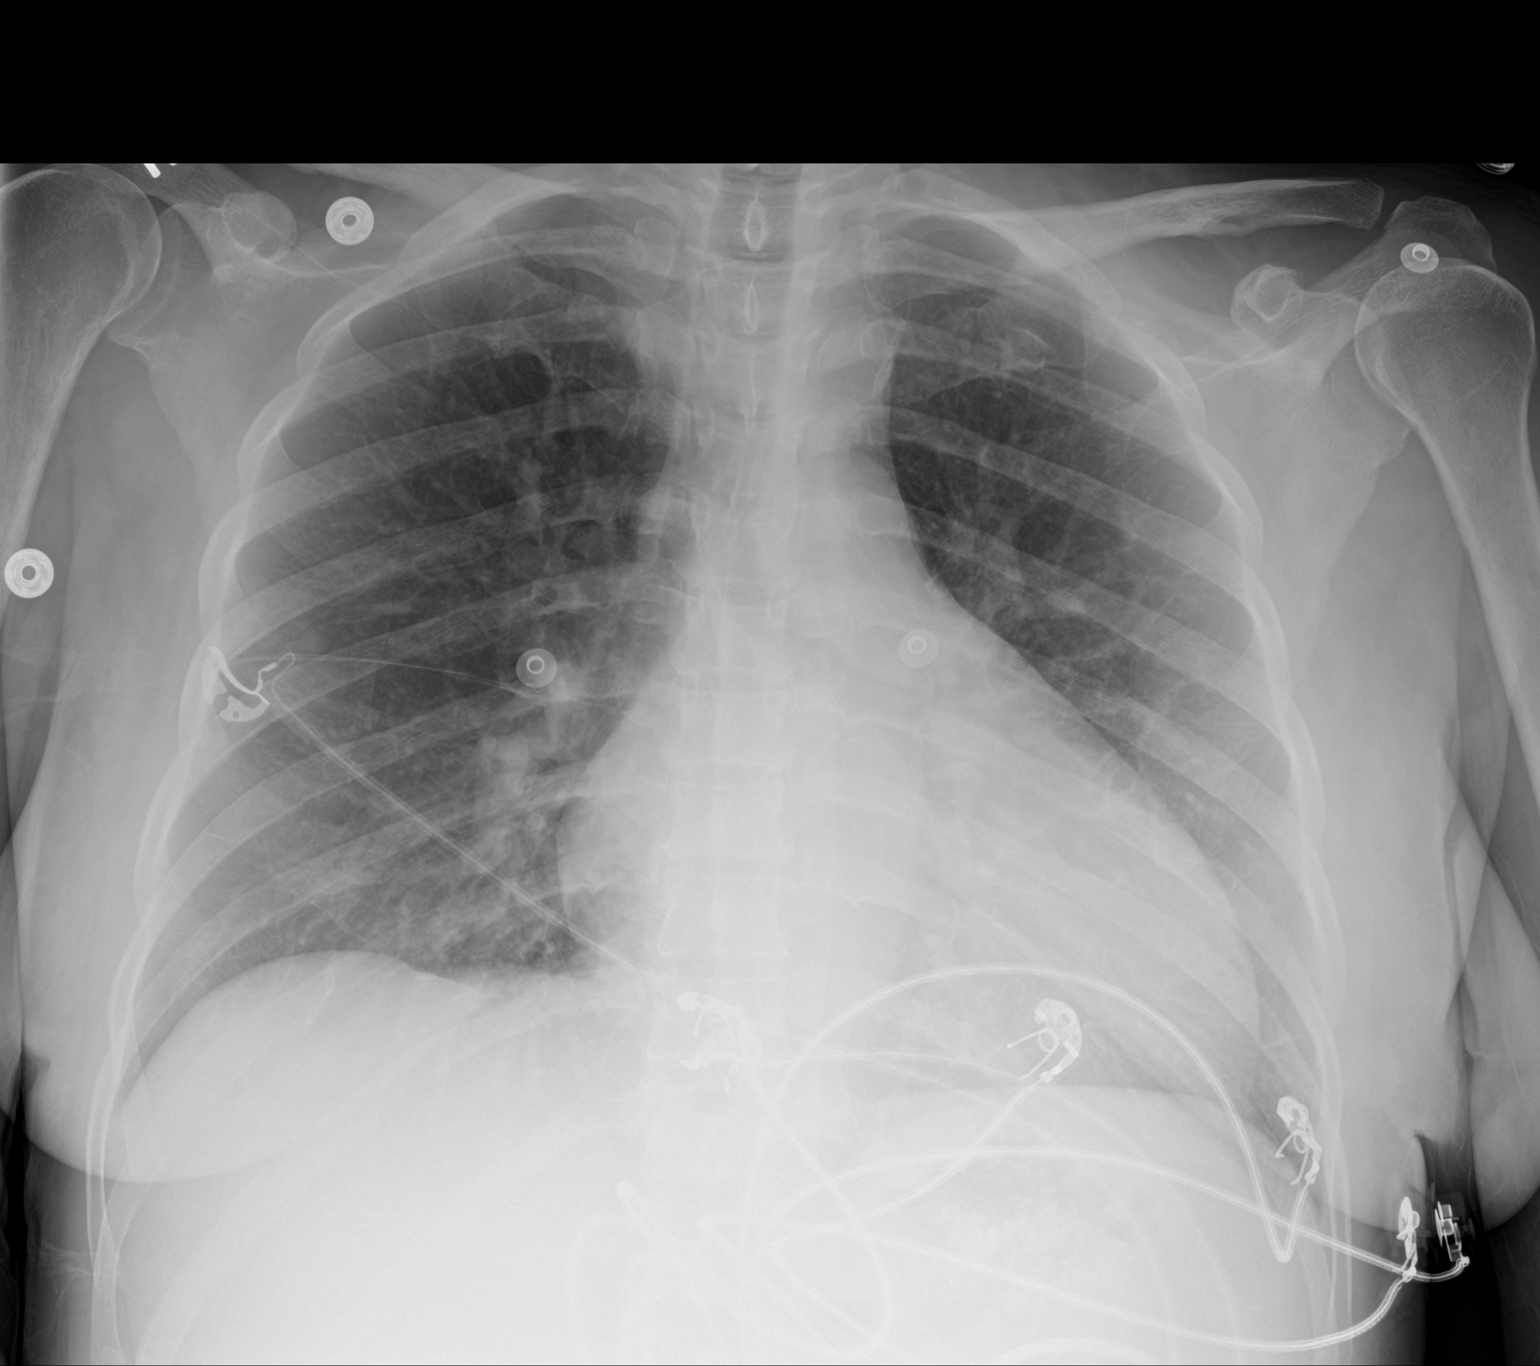

[2 of 2 positions shown; findings below may reference images not displayed]

FINDINGS: There is mild bilateral interstitial thickening. There is no focal
consolidation. There is no pleural effusion or pneumothorax. There
is mild stable cardiomegaly.

The osseous structures are unremarkable.
IMPRESSION: Mild cardiomegaly with mild pulmonary vascular congestion.

## 2017-03-24 ENCOUNTER — Encounter (HOSPITAL_BASED_OUTPATIENT_CLINIC_OR_DEPARTMENT_OTHER): Payer: Medicare Other | Attending: Internal Medicine

## 2017-03-24 ENCOUNTER — Encounter: Payer: Self-pay | Admitting: Neurology

## 2017-04-03 ENCOUNTER — Telehealth (INDEPENDENT_AMBULATORY_CARE_PROVIDER_SITE_OTHER): Payer: Self-pay | Admitting: Orthopaedic Surgery

## 2017-04-03 NOTE — Telephone Encounter (Signed)
Returned call to patient left message to call back to schedule appt  Dr Erlinda Hong

## 2017-04-14 ENCOUNTER — Ambulatory Visit (INDEPENDENT_AMBULATORY_CARE_PROVIDER_SITE_OTHER): Payer: Medicare Other | Admitting: Orthopaedic Surgery

## 2017-04-23 IMAGING — CR DG CHEST 2V
2 series · 2 of 2 positions shown · non-contrast
Comparison: Chest radiograph dated 12/25/2015

CLINICAL DATA: 49-year-old female with shortness of breath and
chest pain

EXAM:
CHEST  2 VIEW

[chest pa]
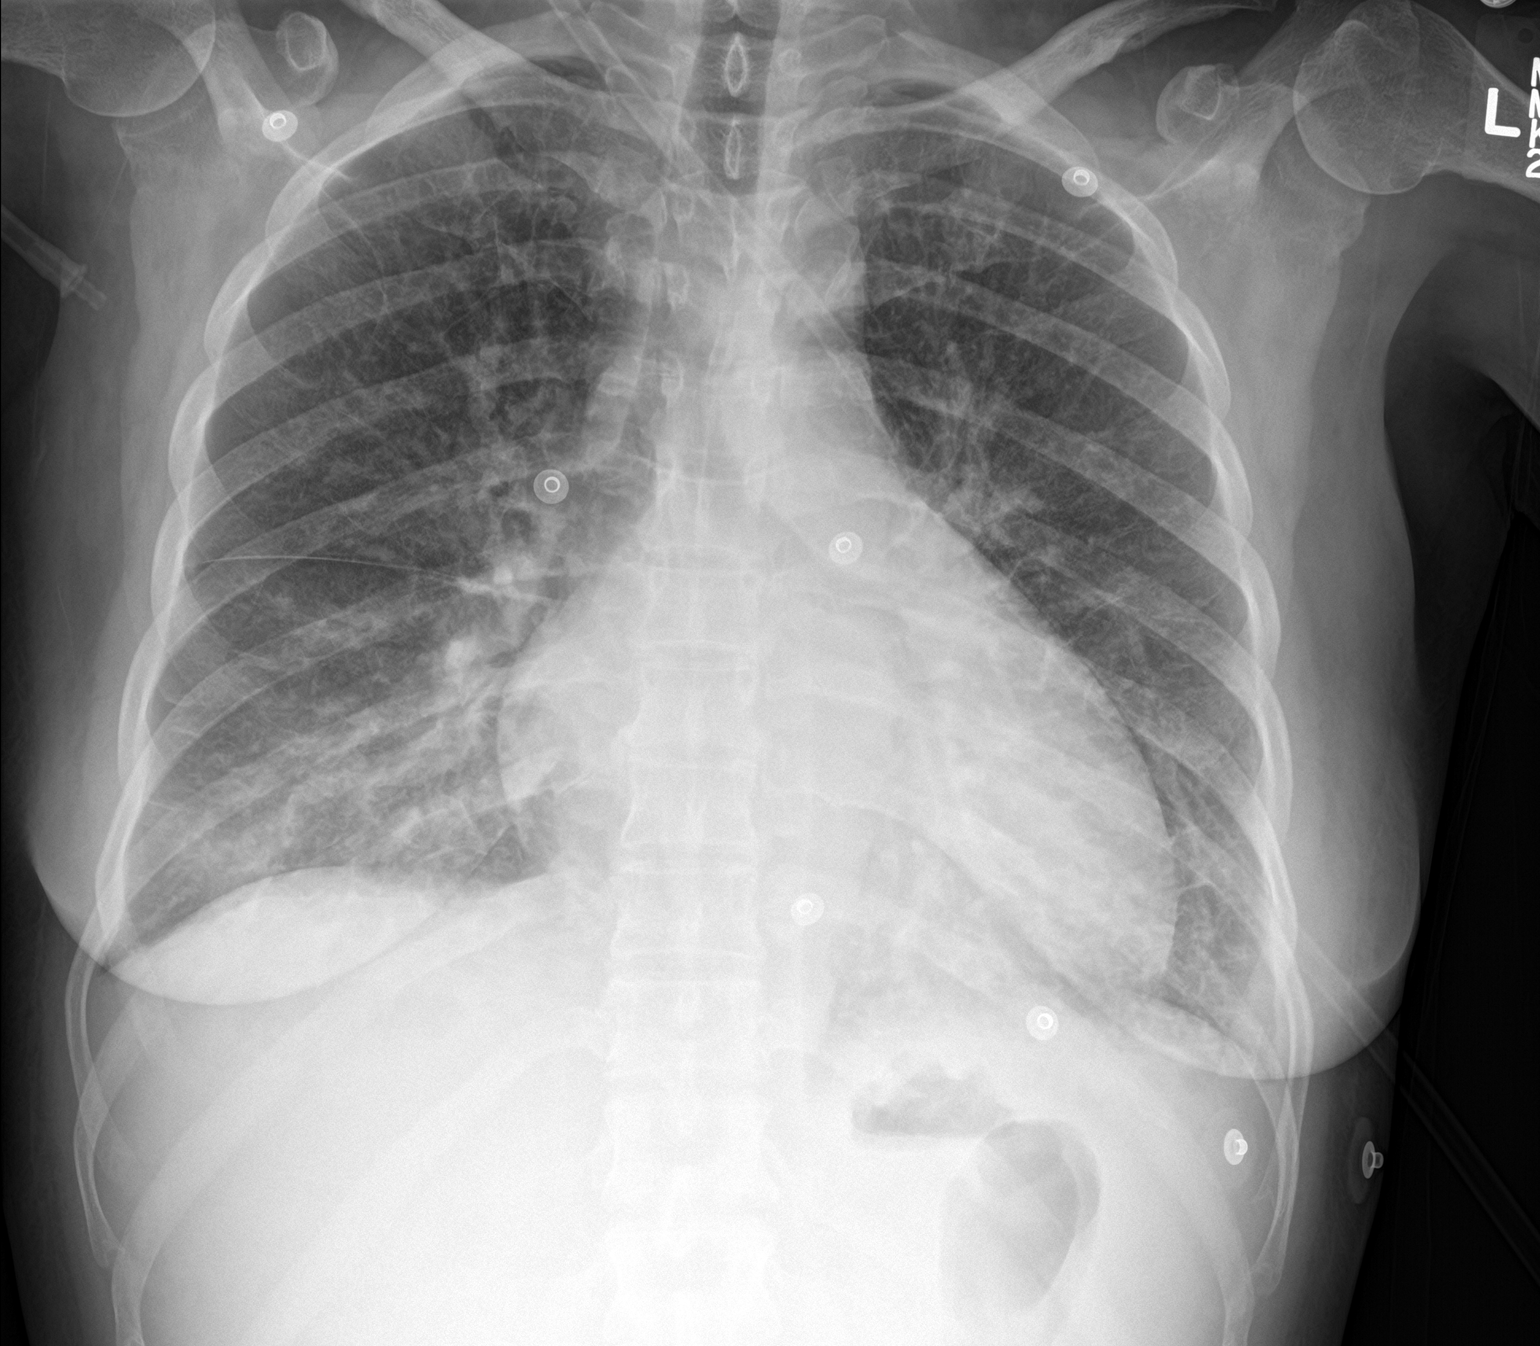

[chest lat]
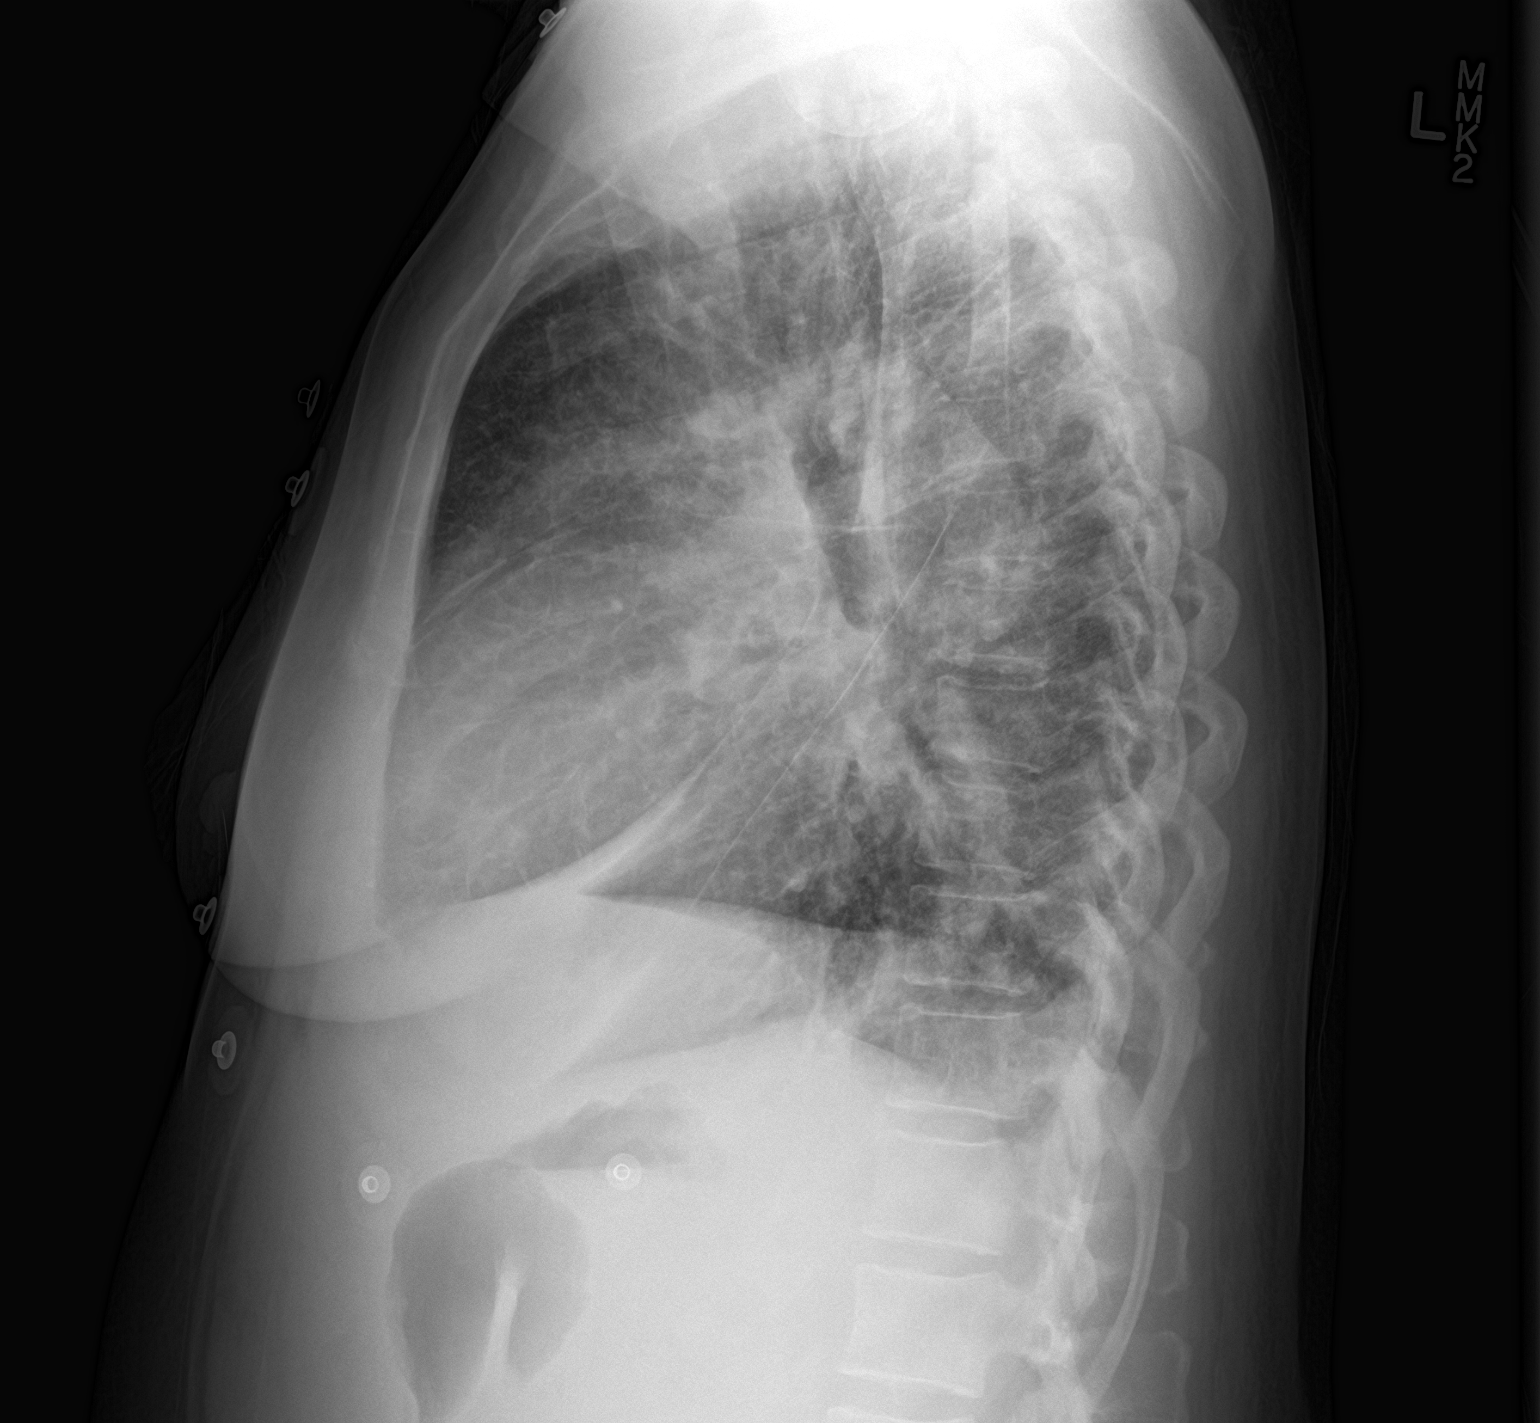

[2 of 2 positions shown; findings below may reference images not displayed]

FINDINGS: Two views of the chest demonstrate mild cardiomegaly with increased
vascular and interstitial prominence compatible with congestive
changes/edema. An area of increased density at the right lung base
is concerning for superimposed pneumonia. Clinical correlation is
recommended. Trace pleural effusion noted along the right major
fissure. There is no pneumothorax. No acute osseous pathology.
IMPRESSION: Mild cardiomegaly with cough congestive changes and edema. Right
lung base hazy densities concerning for superimposed pneumonia.
Clinical correlation and follow-up is recommended.

## 2017-04-24 IMAGING — DX DG CHEST 1V PORT
1 series · 1 of 1 positions shown · non-contrast
Comparison: 1 day prior

CLINICAL DATA: followup of pulmonary edema.

EXAM:
PORTABLE CHEST 1 VIEW

[chest ap]
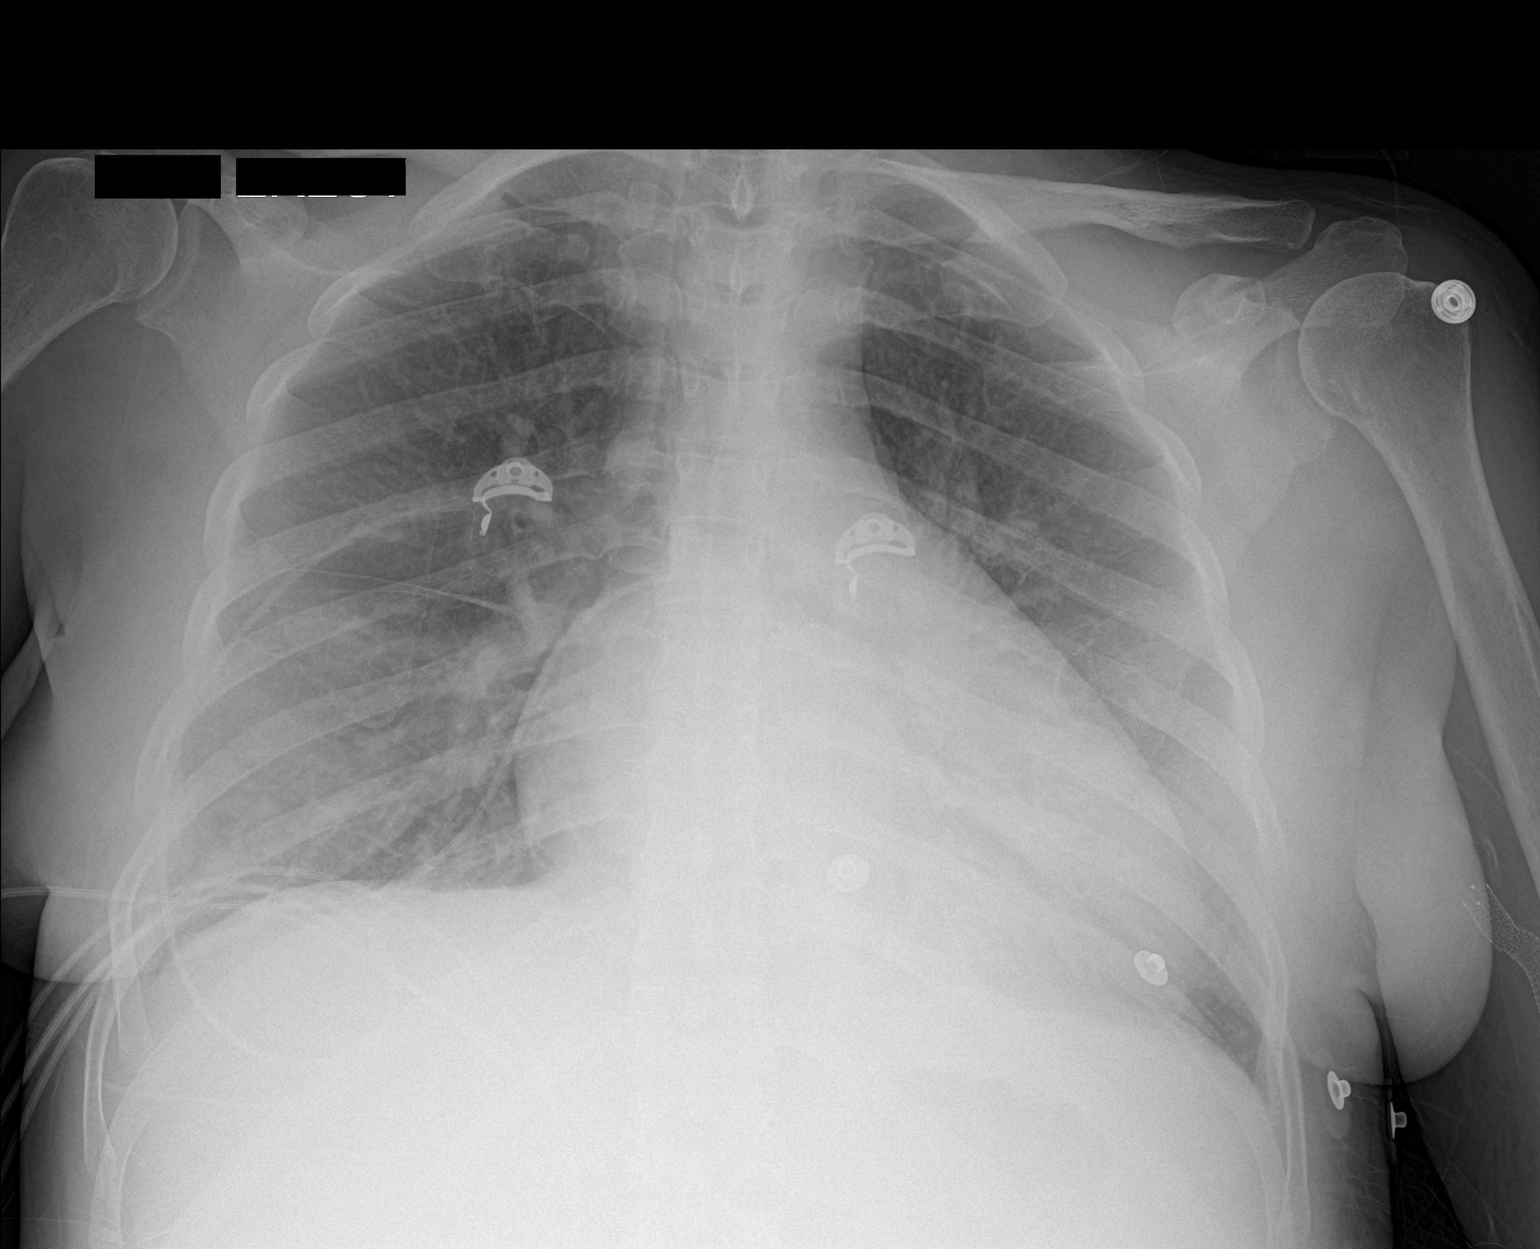

[1 of 1 positions shown; findings below may reference images not displayed]

FINDINGS: Midline trachea. Cardiomegaly accentuated by AP portable technique.
Numerous leads and wires project over the chest. No pleural effusion
or pneumothorax. Mild interstitial edema is similar, given
differences in technique. The previously described right base
airspace disease is less distinct today. There may be mild bibasilar
atelectasis.
IMPRESSION: Similar mild congestive heart failure.

## 2017-04-25 IMAGING — CR DG CHEST 2V
2 series · 2 of 2 positions shown · non-contrast
Comparison: 01/14/2016 pain 01/13/2016.

CLINICAL DATA: Followup for pulmonary edema.

EXAM:
CHEST  2 VIEW

[chest pa]
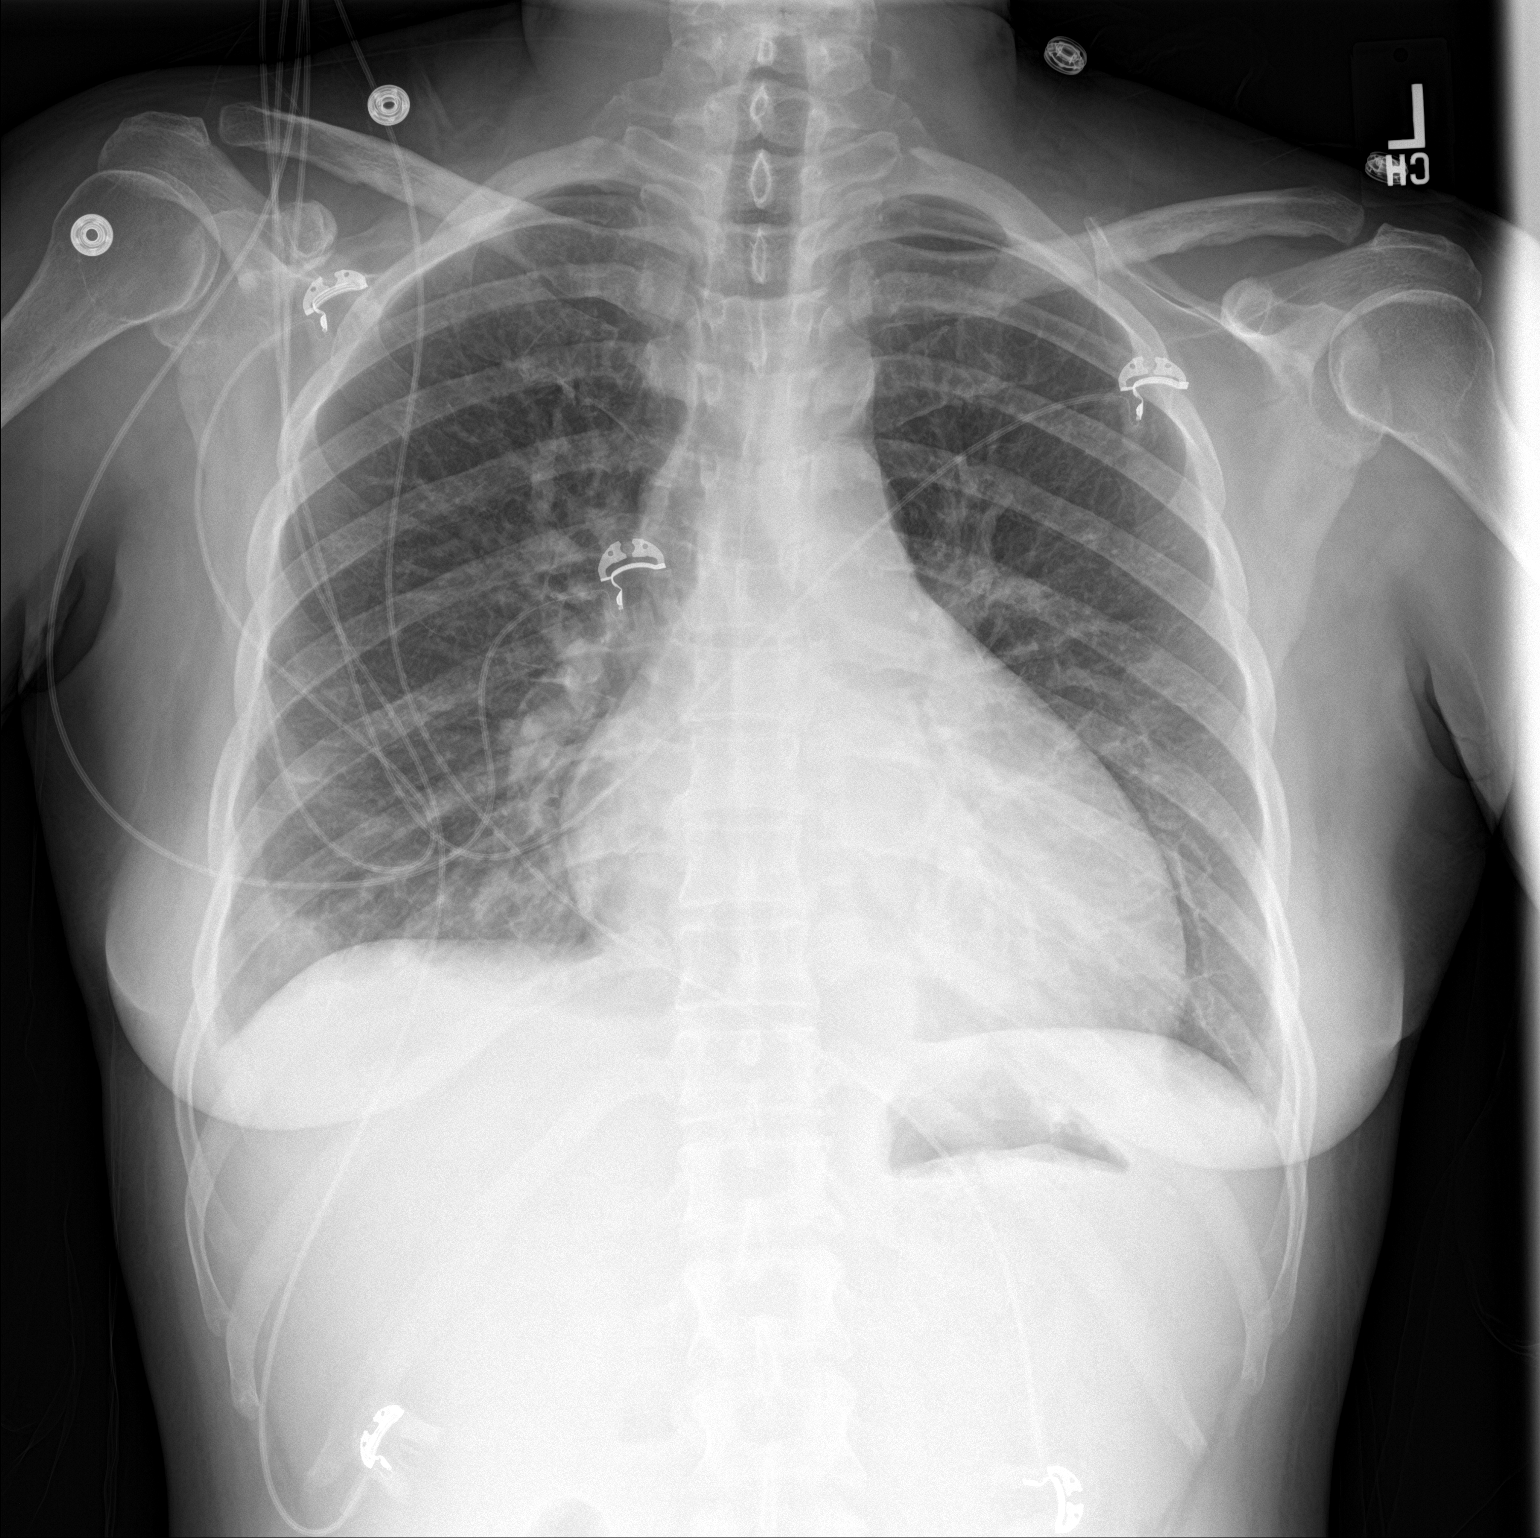

[chest lat]
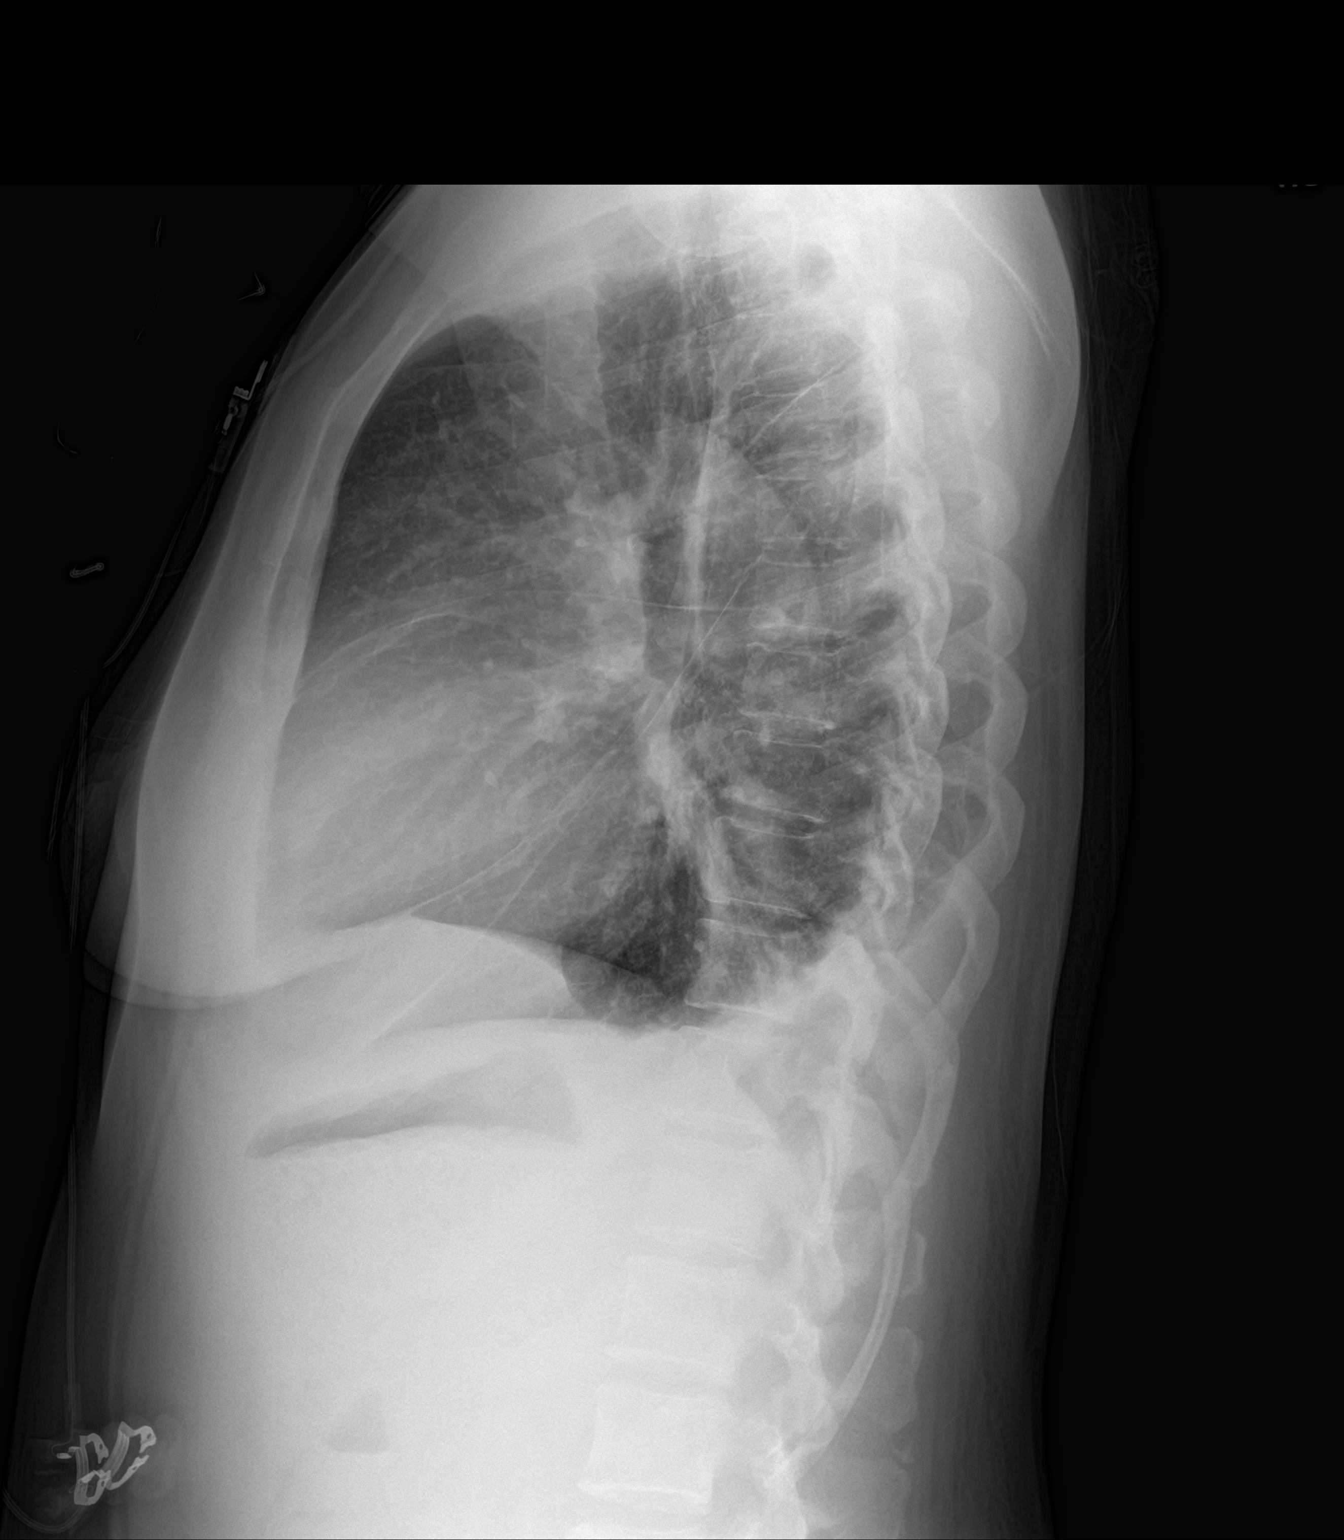

[2 of 2 positions shown; findings below may reference images not displayed]

FINDINGS: Mild enlargement of the cardiopericardial silhouette. No mediastinal
or hilar masses or evidence of adenopathy.

Interstitial thickening has improved over the last 2 days. There is
persistent lung base opacity most evident in the right lower lobe,
which partly silhouettes the posterior right hemidiaphragm. There is
a probable minimal right pleural effusion.

No new lung opacities.  No pneumothorax.
IMPRESSION: 1. Findings are consistent with improved pulmonary edema.
2. Residual opacity the right lower lobe is most likely atelectasis
combination with a minimal effusion. Pneumonia is possible but felt
less likely.

## 2017-04-28 ENCOUNTER — Ambulatory Visit (INDEPENDENT_AMBULATORY_CARE_PROVIDER_SITE_OTHER): Payer: Medicare Other | Admitting: Orthopaedic Surgery

## 2017-05-01 ENCOUNTER — Encounter (INDEPENDENT_AMBULATORY_CARE_PROVIDER_SITE_OTHER): Payer: Self-pay | Admitting: Orthopaedic Surgery

## 2017-05-01 ENCOUNTER — Ambulatory Visit (INDEPENDENT_AMBULATORY_CARE_PROVIDER_SITE_OTHER): Payer: Medicare Other | Admitting: Orthopaedic Surgery

## 2017-05-01 DIAGNOSIS — S42411K Displaced simple supracondylar fracture without intercondylar fracture of right humerus, subsequent encounter for fracture with nonunion: Secondary | ICD-10-CM | POA: Insufficient documentation

## 2017-05-01 NOTE — Progress Notes (Signed)
Office Visit Note   Patient: [name]           Date of Birth: [birth year]           MRN: [id number] Visit Date: 05/01/2017              Requested by: Maurice Small, MD [place], [place] [address] PCP: Maurice Small, MD   Assessment & Plan: Visit Diagnoses:  1. Right supracondylar humerus fracture, with nonunion, subsequent encounter     Plan: patient has 51 year old right [facility] nonunion.  She states she no longer uses cocaine, alcohol or tobacco although UDS 4 months ago was positive for cocaine.  At 51 years old, she's not a good candidate for TEA.  She is ESRD on HD.  She's at significant risk for postop complications.  My recommendation is to leave it as is.  She has been told by Dr. Sheryle Spray at [place] to also leave it as is.  She's requesting a third opinion.  We will find an appropriate referral for her.    Follow-Up Instructions: Return if symptoms worsen or fail to improve.   Orders:  No orders of the defined types were placed in this encounter.  No orders of the defined types were placed in this encounter.     Procedures: No procedures performed   Clinical Data: No additional findings.   Subjective: Chief Complaint  Patient presents with  . Right Elbow - Pain    Patient f/u today for right [facility] nonunion of 51 years old.  She c/o pain and disability regarding the arm.  She does not like the cosmetic appearance of the arm either.  Denies n/t.    Review of Systems  Constitutional: Negative.   HENT: Negative.   Eyes: Negative.   Respiratory: Negative.   Cardiovascular: Negative.   Endocrine: Negative.   Musculoskeletal: Negative.   Neurological: Negative.   Hematological: Negative.   Psychiatric/Behavioral: Negative.   All other systems reviewed and are negative.    Objective: Vital Signs: LMP  (LMP Unknown)   Physical Exam  Constitutional: She is oriented to person, place, and time. She appears well-developed and  well-nourished.  Pulmonary/Chest: Effort normal.  Neurological: She is alert and oriented to person, place, and time.  Skin: Skin is warm. Capillary refill takes less than 2 seconds.  Psychiatric: She has a normal mood and affect. Her behavior is normal. Judgment and thought content normal.  Nursing note and vitals reviewed.   Ortho Exam Right elbow - cubitus varus deformity - NVI distally  Specialty Comments:  No specialty comments available.  Imaging: No results found.   PMFS History: Patient Active Problem List   Diagnosis Date Noted  . Right supracondylar humerus fracture, with nonunion, subsequent encounter 05/01/2017  . Malnutrition of moderate degree 02/28/2017  . Leg pain, left 02/25/2017  . Left leg pain 02/25/2017  . External hemorrhoids   . Diverticulosis of colon without hemorrhage   . Goals of care, counseling/discussion   . Palliative care encounter   . Cellulitis of left leg 01/16/2017  . Diabetes mellitus with complication ([place]) 44/08/270  . Upper GI bleed 01/16/2017  . UGIB (upper gastrointestinal bleed)   . Anemia due to chronic kidney disease   . Cellulitis of left thigh 01/01/2017  . ESRD (end stage renal disease) ([place]) 01/01/2017  . Cocaine use disorder, severe, dependence ([place]) 08/29/2016  . Hepatitis C 08/29/2016  . Diabetic ulcer of calf ([place])  08/29/2016  . Diabetic ulcer of toe ([place]) 08/29/2016  . Cerebrovascular accident (CVA) ([place]) 08/29/2016  . MDD (major depressive disorder) 08/28/2016  . Dyslipidemia associated with type 2 diabetes mellitus ([place]) 11/10/2015  . History of vitamin D deficiency 06/20/2015  . Long term prescription opiate use 06/20/2015  . Chronic pain syndrome 06/19/2015  . Anemia of chronic kidney failure 05/01/2015  . transmetatarsal amputation of the left foot 12/13/2014  . Unilateral visual loss 11/22/2014  . 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 ([place]) 11/22/2014  . Essential hypertension 11/09/2014  .  Chronic combined systolic (congestive) and diastolic (congestive) heart failure ([place])   . Rectal bleeding 04/23/2014  . Anemia, iron deficiency 04/13/2014  . Noncompliance 02/02/2014  . Hyponatremia 05/19/2013  . CKD (chronic kidney disease) stage 5, GFR less than 15 ml/min (HCC) 05/18/2013  . Seizure disorder ([place]) 02/09/2013   Past Medical History:  Diagnosis Date  . Anemia   . Anxiety   . Bipolar disorder, unspecified ([place]) 02/06/2014  . Chronic combined systolic and diastolic CHF (congestive heart failure) (HCC)    EF 40% by echo and 48% by stress test  . Chronic pain syndrome   . Depression   . ESRD on hemodialysis ([place])   . GERD (gastroesophageal reflux disease)   . Glaucoma   . Headache    sinus headaches  . Hepatitis C   . High cholesterol   . History of hiatal hernia   . History of vitamin D deficiency 06/20/2015  . Hypertension   . MRSA infection ?2014   "on the back of my head; spread throughout my bloodstream"  . Neuropathy   . Refusal of blood transfusions as patient is Jehovah's Witness   . Schizoaffective disorder, unspecified condition 02/08/2014  . Seizure [facility]) dx'd 2014   "don't know what kind; last one was ~ 04/2014"  . Stroke [facility])    TIA 2009  . TIA (transient ischemic attack) 2010  . Type II diabetes mellitus (HCC)    INSULIN DEPENDENT    Family History  Problem Relation Age of Onset  . Hypertension Mother   . Hypertension Father     Past Surgical History:  Procedure Laterality Date  . ABDOMINAL HYSTERECTOMY  2014  . ABDOMINAL HYSTERECTOMY  2010  . AMPUTATION Left 05/21/2013   Procedure: Left Midfoot AMPUTATION;  Surgeon: Newt Minion, MD;  Location: [facility];  Service: Orthopedics;  Laterality: Left;  Left Midfoot Amputation  . AV FISTULA PLACEMENT Left 05/04/2015   Procedure: ARTERIOVENOUS (AV) GRAFT INSERTION;  Surgeon: Angelia Mould, MD;  Location: [facility];  Service: Vascular;  Laterality: Left;  . COLONOSCOPY WITH PROPOFOL N/A 06/22/2013     Procedure: COLONOSCOPY WITH PROPOFOL;  Surgeon: Jeryl Columbia, MD;  Location: [facility] ENDOSCOPY;  Service: Endoscopy;  Laterality: N/A;  . ESOPHAGOGASTRODUODENOSCOPY N/A 05/11/2015   Procedure: ESOPHAGOGASTRODUODENOSCOPY (EGD);  Surgeon: Laurence Spates, MD;  Location[facility] END[facility]COPY;  Service: Endoscopy;  Laterality: N/A;  . EYE SURGERY Bilateral 2010   Lasik  . FLEXIBLE SIGMOIDOSCOPY N/A 01/17/2017   Procedure: FLEXIBLE SIGMOIDOSCOPY w/ hemorrhoid banding possible;  Surgeon: Gatha Mayer, MD;  Location: [facility] ENDOSCOPY;  Service: Endoscopy;  Laterality: N/A;  . FOOT AMPUTATION Left    due diabetic neuropathy, could not feel ulcer on bottom of foot  . REFRACTIVE SURGERY Bilateral 2013  . TONSILLECTOMY  1970's  . TUBAL LIGATION  2000   Social History   Occupational History  . Not on file.   Social History Main  Topics  . Smoking status: Former Smoker    Packs/day: 1.00    Years: 20.00    Types: Cigarettes    Quit date: 03/31/2015  . Smokeless tobacco: Never Used     Comment: "stopped smoking in ~ 2013; restarted last week; stopped again 06/17/2014"  . Alcohol use 1.8 oz/week    3 Standard drinks or equivalent per week     Comment: 05/01/15 patient denies alcohol use.  . Drug use: Yes    Types: "Crack" cocaine, Cocaine     Comment: pt reports last use June 2018  . Sexual activity: Not Currently    Birth control/ protection: Abstinence

## 2017-06-03 ENCOUNTER — Inpatient Hospital Stay (HOSPITAL_COMMUNITY): Payer: Medicare Other

## 2017-06-03 ENCOUNTER — Emergency Department (HOSPITAL_COMMUNITY): Payer: Medicare Other

## 2017-06-03 ENCOUNTER — Encounter (HOSPITAL_COMMUNITY): Payer: Self-pay | Admitting: Emergency Medicine

## 2017-06-03 ENCOUNTER — Inpatient Hospital Stay (HOSPITAL_COMMUNITY)
Admission: EM | Admit: 2017-06-03 | Discharge: 2017-06-07 | DRG: 640 | Disposition: A | Discharge status: Skilled Nursing Facility | Payer: Medicare Other | Attending: Internal Medicine | Admitting: Internal Medicine

## 2017-06-03 DIAGNOSIS — K219 Gastro-esophageal reflux disease without esophagitis: Secondary | ICD-10-CM | POA: Diagnosis present

## 2017-06-03 DIAGNOSIS — Z87891 Personal history of nicotine dependence: Secondary | ICD-10-CM | POA: Diagnosis not present

## 2017-06-03 DIAGNOSIS — Z992 Dependence on renal dialysis: Secondary | ICD-10-CM

## 2017-06-03 DIAGNOSIS — H8190 Unspecified disorder of vestibular function, unspecified ear: Secondary | ICD-10-CM | POA: Diagnosis present

## 2017-06-03 DIAGNOSIS — Z9114 Patient's other noncompliance with medication regimen: Secondary | ICD-10-CM | POA: Diagnosis not present

## 2017-06-03 DIAGNOSIS — N186 End stage renal disease: Secondary | ICD-10-CM | POA: Diagnosis present

## 2017-06-03 DIAGNOSIS — N2581 Secondary hyperparathyroidism of renal origin: Secondary | ICD-10-CM | POA: Diagnosis present

## 2017-06-03 DIAGNOSIS — G894 Chronic pain syndrome: Secondary | ICD-10-CM | POA: Diagnosis present

## 2017-06-03 DIAGNOSIS — D631 Anemia in chronic kidney disease: Secondary | ICD-10-CM | POA: Diagnosis present

## 2017-06-03 DIAGNOSIS — R2681 Unsteadiness on feet: Secondary | ICD-10-CM

## 2017-06-03 DIAGNOSIS — H409 Unspecified glaucoma: Secondary | ICD-10-CM | POA: Diagnosis present

## 2017-06-03 DIAGNOSIS — E8770 Fluid overload, unspecified: Secondary | ICD-10-CM | POA: Diagnosis present

## 2017-06-03 DIAGNOSIS — E1165 Type 2 diabetes mellitus with hyperglycemia: Secondary | ICD-10-CM | POA: Diagnosis present

## 2017-06-03 DIAGNOSIS — I5040 Unspecified combined systolic (congestive) and diastolic (congestive) heart failure: Secondary | ICD-10-CM | POA: Diagnosis not present

## 2017-06-03 DIAGNOSIS — E1110 Type 2 diabetes mellitus with ketoacidosis without coma: Secondary | ICD-10-CM

## 2017-06-03 DIAGNOSIS — Z89422 Acquired absence of other left toe(s): Secondary | ICD-10-CM

## 2017-06-03 DIAGNOSIS — Z79899 Other long term (current) drug therapy: Secondary | ICD-10-CM | POA: Diagnosis not present

## 2017-06-03 DIAGNOSIS — E1140 Type 2 diabetes mellitus with diabetic neuropathy, unspecified: Secondary | ICD-10-CM | POA: Diagnosis present

## 2017-06-03 DIAGNOSIS — E1122 Type 2 diabetes mellitus with diabetic chronic kidney disease: Secondary | ICD-10-CM | POA: Diagnosis present

## 2017-06-03 DIAGNOSIS — R21 Rash and other nonspecific skin eruption: Secondary | ICD-10-CM

## 2017-06-03 DIAGNOSIS — R739 Hyperglycemia, unspecified: Secondary | ICD-10-CM

## 2017-06-03 DIAGNOSIS — Z885 Allergy status to narcotic agent status: Secondary | ICD-10-CM | POA: Diagnosis not present

## 2017-06-03 DIAGNOSIS — I5042 Chronic combined systolic (congestive) and diastolic (congestive) heart failure: Secondary | ICD-10-CM | POA: Diagnosis present

## 2017-06-03 DIAGNOSIS — F259 Schizoaffective disorder, unspecified: Secondary | ICD-10-CM | POA: Diagnosis present

## 2017-06-03 DIAGNOSIS — I120 Hypertensive chronic kidney disease with stage 5 chronic kidney disease or end stage renal disease: Secondary | ICD-10-CM | POA: Diagnosis not present

## 2017-06-03 DIAGNOSIS — Z794 Long term (current) use of insulin: Secondary | ICD-10-CM | POA: Diagnosis not present

## 2017-06-03 DIAGNOSIS — I5022 Chronic systolic (congestive) heart failure: Secondary | ICD-10-CM

## 2017-06-03 DIAGNOSIS — Z8673 Personal history of transient ischemic attack (TIA), and cerebral infarction without residual deficits: Secondary | ICD-10-CM

## 2017-06-03 DIAGNOSIS — Z8249 Family history of ischemic heart disease and other diseases of the circulatory system: Secondary | ICD-10-CM

## 2017-06-03 DIAGNOSIS — Z888 Allergy status to other drugs, medicaments and biological substances status: Secondary | ICD-10-CM | POA: Diagnosis not present

## 2017-06-03 DIAGNOSIS — Z89412 Acquired absence of left great toe: Secondary | ICD-10-CM | POA: Diagnosis not present

## 2017-06-03 DIAGNOSIS — I132 Hypertensive heart and chronic kidney disease with heart failure and with stage 5 chronic kidney disease, or end stage renal disease: Secondary | ICD-10-CM | POA: Diagnosis present

## 2017-06-03 DIAGNOSIS — E8779 Other fluid overload: Secondary | ICD-10-CM | POA: Diagnosis not present

## 2017-06-03 DIAGNOSIS — R42 Dizziness and giddiness: Secondary | ICD-10-CM

## 2017-06-03 DIAGNOSIS — E875 Hyperkalemia: Secondary | ICD-10-CM | POA: Diagnosis present

## 2017-06-03 DIAGNOSIS — Z880 Allergy status to penicillin: Secondary | ICD-10-CM

## 2017-06-03 DIAGNOSIS — E7800 Pure hypercholesterolemia, unspecified: Secondary | ICD-10-CM | POA: Diagnosis present

## 2017-06-03 DIAGNOSIS — R2689 Other abnormalities of gait and mobility: Secondary | ICD-10-CM | POA: Diagnosis not present

## 2017-06-03 LAB — CBC WITH DIFFERENTIAL/PLATELET
Basophils Absolute: 0 10*3/uL (ref 0.0–0.1)
Basophils Relative: 0 %
EOS ABS: 0 10*3/uL (ref 0.0–0.7)
Eosinophils Relative: 1 %
HCT: 29.3 % — ABNORMAL LOW (ref 36.0–46.0)
HEMOGLOBIN: 9.7 g/dL — AB (ref 12.0–15.0)
LYMPHS ABS: 0.6 10*3/uL — AB (ref 0.7–4.0)
Lymphocytes Relative: 18 %
MCH: 29.6 pg (ref 26.0–34.0)
MCHC: 33.1 g/dL (ref 30.0–36.0)
MCV: 89.3 fL (ref 78.0–100.0)
Monocytes Absolute: 0.2 10*3/uL (ref 0.1–1.0)
Monocytes Relative: 6 %
NEUTROS PCT: 75 %
Neutro Abs: 2.2 10*3/uL (ref 1.7–7.7)
Platelets: 129 10*3/uL — ABNORMAL LOW (ref 150–400)
RBC: 3.28 MIL/uL — AB (ref 3.87–5.11)
RDW: 17.6 % — ABNORMAL HIGH (ref 11.5–15.5)
WBC: 3 10*3/uL — AB (ref 4.0–10.5)

## 2017-06-03 LAB — BASIC METABOLIC PANEL
Anion gap: 12 (ref 5–15)
BUN: 14 mg/dL (ref 6–20)
CHLORIDE: 93 mmol/L — AB (ref 101–111)
CO2: 27 mmol/L (ref 22–32)
Calcium: 8.6 mg/dL — ABNORMAL LOW (ref 8.9–10.3)
Creatinine, Ser: 3.14 mg/dL — ABNORMAL HIGH (ref 0.44–1.00)
GFR calc non Af Amer: 16 mL/min — ABNORMAL LOW (ref >60)
GFR, EST AFRICAN AMERICAN: 19 mL/min — AB (ref >60)
Glucose, Bld: 129 mg/dL — ABNORMAL HIGH (ref 65–99)
POTASSIUM: 3.8 mmol/L (ref 3.5–5.1)
SODIUM: 132 mmol/L — AB (ref 135–145)

## 2017-06-03 LAB — COMPREHENSIVE METABOLIC PANEL
ALT: 27 U/L (ref 14–54)
ANION GAP: 17 — AB (ref 5–15)
AST: 29 U/L (ref 15–41)
Albumin: 3 g/dL — ABNORMAL LOW (ref 3.5–5.0)
Alkaline Phosphatase: 121 U/L (ref 38–126)
BUN: 50 mg/dL — ABNORMAL HIGH (ref 6–20)
CHLORIDE: 80 mmol/L — AB (ref 101–111)
CO2: 23 mmol/L (ref 22–32)
CREATININE: 6.59 mg/dL — AB (ref 0.44–1.00)
Calcium: 8.6 mg/dL — ABNORMAL LOW (ref 8.9–10.3)
GFR, EST AFRICAN AMERICAN: 8 mL/min — AB (ref >60)
GFR, EST NON AFRICAN AMERICAN: 7 mL/min — AB (ref >60)
Glucose, Bld: 646 mg/dL (ref 65–99)
POTASSIUM: 7.3 mmol/L — AB (ref 3.5–5.1)
Sodium: 120 mmol/L — ABNORMAL LOW (ref 135–145)
Total Bilirubin: 0.9 mg/dL (ref 0.3–1.2)
Total Protein: 7.1 g/dL (ref 6.5–8.1)

## 2017-06-03 LAB — BETA-HYDROXYBUTYRIC ACID: Beta-Hydroxybutyric Acid: 0.07 mmol/L (ref 0.05–0.27)

## 2017-06-03 LAB — GLUCOSE, CAPILLARY
GLUCOSE-CAPILLARY: 133 mg/dL — AB (ref 65–99)
GLUCOSE-CAPILLARY: 180 mg/dL — AB (ref 65–99)
Glucose-Capillary: 132 mg/dL — ABNORMAL HIGH (ref 65–99)

## 2017-06-03 LAB — CBG MONITORING, ED: Glucose-Capillary: >600 mg/dL (ref 65–99)

## 2017-06-03 LAB — TROPONIN I: Troponin I: <0.03 ng/mL (ref <0.03)

## 2017-06-03 LAB — BRAIN NATRIURETIC PEPTIDE: B NATRIURETIC PEPTIDE 5: 2212.3 pg/mL — AB (ref 0.0–100.0)

## 2017-06-03 LAB — I-STAT TROPONIN, ED: Troponin i, poc: 0 ng/mL (ref 0.00–0.08)

## 2017-06-03 LAB — HEPATITIS B SURFACE ANTIGEN: Hepatitis B Surface Ag: NEGATIVE

## 2017-06-03 MED ORDER — HYDRALAZINE HCL 20 MG/ML IJ SOLN
5.0000 mg | Freq: Once | INTRAMUSCULAR | Status: AC
  Administered 2017-06-03: 5 mg via INTRAVENOUS
  Filled 2017-06-03: qty 1

## 2017-06-03 MED ORDER — TETRACAINE HCL 0.5 % OP SOLN
1.0000 [drp] | Freq: Once | OPHTHALMIC | Status: AC
  Administered 2017-06-03: 1 [drp] via OPHTHALMIC
  Filled 2017-06-03: qty 4

## 2017-06-03 MED ORDER — PENTAFLUOROPROP-TETRAFLUOROETH EX AERO: 1.0000 "application " | INHALATION_SPRAY | CUTANEOUS | Status: DC | PRN

## 2017-06-03 MED ORDER — ACETAMINOPHEN 650 MG RE SUPP
650.0000 mg | Freq: Four times a day (QID) | RECTAL | Status: DC | PRN
  Filled 2017-06-03: qty 1

## 2017-06-03 MED ORDER — SODIUM CHLORIDE 0.9 % IV SOLN: 100.0000 mL | INTRAVENOUS | Status: DC | PRN

## 2017-06-03 MED ORDER — SODIUM CHLORIDE 0.9 % IV SOLN: INTRAVENOUS | Status: DC

## 2017-06-03 MED ORDER — ONDANSETRON HCL 4 MG/2ML IJ SOLN
4.0000 mg | Freq: Once | INTRAMUSCULAR | Status: AC
  Administered 2017-06-03: 4 mg via INTRAVENOUS
  Filled 2017-06-03: qty 2

## 2017-06-03 MED ORDER — DICLOFENAC SODIUM 1 % TD GEL
2.0000 g | Freq: Once | TRANSDERMAL | Status: AC | PRN
  Administered 2017-06-04: 03:00:00 2 g via TOPICAL
  Filled 2017-06-03: qty 100

## 2017-06-03 MED ORDER — LIDOCAINE HCL (PF) 1 % IJ SOLN: 5.0000 mL | INTRAMUSCULAR | Status: DC | PRN

## 2017-06-03 MED ORDER — SODIUM CHLORIDE 0.9 % IV SOLN
INTRAVENOUS | Status: DC
  Administered 2017-06-03: 5.4 [IU]/h via INTRAVENOUS
  Filled 2017-06-03: qty 1

## 2017-06-03 MED ORDER — INSULIN ASPART 100 UNIT/ML ~~LOC~~ SOLN
10.0000 [IU] | Freq: Once | SUBCUTANEOUS | Status: DC
  Filled 2017-06-03: qty 1

## 2017-06-03 MED ORDER — ALBUTEROL SULFATE (2.5 MG/3ML) 0.083% IN NEBU: 2.5000 mg | INHALATION_SOLUTION | Freq: Four times a day (QID) | RESPIRATORY_TRACT | Status: DC | PRN

## 2017-06-03 MED ORDER — HYDROCORTISONE 1 % EX CREA
TOPICAL_CREAM | Freq: Two times a day (BID) | CUTANEOUS | Status: DC | PRN
  Administered 2017-06-04: 03:00:00 via TOPICAL
  Filled 2017-06-03: qty 28

## 2017-06-03 MED ORDER — HEPARIN SODIUM (PORCINE) 5000 UNIT/ML IJ SOLN
5000.0000 [IU] | Freq: Three times a day (TID) | INTRAMUSCULAR | Status: DC
  Administered 2017-06-03 – 2017-06-06 (×10): 5000 [IU] via SUBCUTANEOUS
  Filled 2017-06-03 (×14): qty 1

## 2017-06-03 MED ORDER — DIPHENHYDRAMINE HCL 50 MG/ML IJ SOLN
12.5000 mg | Freq: Once | INTRAMUSCULAR | Status: AC
  Administered 2017-06-03: 12.5 mg via INTRAVENOUS
  Filled 2017-06-03: qty 1

## 2017-06-03 MED ORDER — LIDOCAINE-PRILOCAINE 2.5-2.5 % EX CREA: 1.0000 "application " | TOPICAL_CREAM | CUTANEOUS | Status: DC | PRN

## 2017-06-03 MED ORDER — SODIUM CHLORIDE 0.9 % IV SOLN
1.0000 g | INTRAVENOUS | Status: AC
  Administered 2017-06-03: 1 g via INTRAVENOUS
  Filled 2017-06-03: qty 10

## 2017-06-03 MED ORDER — INSULIN ASPART 100 UNIT/ML IV SOLN: 10.0000 [IU] | Freq: Once | INTRAVENOUS | Status: DC

## 2017-06-03 MED ORDER — DEXTROSE-NACL 5-0.45 % IV SOLN
INTRAVENOUS | Status: DC
  Administered 2017-06-03: 23:00:00 via INTRAVENOUS

## 2017-06-03 MED ORDER — HEPARIN SODIUM (PORCINE) 1000 UNIT/ML DIALYSIS: 1000.0000 [IU] | INTRAMUSCULAR | Status: DC | PRN

## 2017-06-03 MED ORDER — ALBUTEROL SULFATE (2.5 MG/3ML) 0.083% IN NEBU
10.0000 mg | INHALATION_SOLUTION | Freq: Once | RESPIRATORY_TRACT | Status: AC
  Administered 2017-06-03: 10 mg via RESPIRATORY_TRACT
  Filled 2017-06-03: qty 12

## 2017-06-03 MED ORDER — SODIUM CHLORIDE 0.9 % IV SOLN: INTRAVENOUS | Status: AC

## 2017-06-03 MED ORDER — ALTEPLASE 2 MG IJ SOLR: 2.0000 mg | Freq: Once | INTRAMUSCULAR | Status: DC | PRN

## 2017-06-03 MED ORDER — ACETAMINOPHEN 325 MG PO TABS
650.0000 mg | ORAL_TABLET | Freq: Four times a day (QID) | ORAL | Status: DC | PRN
  Administered 2017-06-03 – 2017-06-07 (×3): 650 mg via ORAL
  Filled 2017-06-03 (×2): qty 2

## 2017-06-03 NOTE — Progress Notes (Signed)
HD tx completed @ 1945 w/o problem, had increased bleeding time w/ both sites, UF goal met, blood rinsed back, awaiting primary nurse to call me back to give report

## 2017-06-03 NOTE — ED Triage Notes (Signed)
Patient arrives with [facility] with complaint of shortness of breath after missing two dialysis appointments, states she was unable to go because she felt like her legs were weak. Last dialyzed last Thursday. Oxygen saturation 98% on room air, respirations unlabored.  Patient also complains of decreased vision in right eye that started two days ago.  Patient states two days ago she the vision in her right eye only has a small clear area in the center of her vision and the surrounding area is "white and fuzzy".  Patient also states her glucometer at home as been broken "for a while". Glucometer with EMS reads "high", medic states high reading indicates CBG greater than 550. States she took 15 units of novolog two days ago as an estimation of needed insulin, glucometer had been broken "for a while" prior to that administration.  Patient also complains of draining to left foot after a partial amputation. 5 cm blister with serosanguinous drainage.   Patient also complains of spots on back that itch, patient requesting treatment for itchy back.  Patient is alert and oriented and in no apparent distress at this time.  22g saline lock in right hand. Received 4mg  zofran PTA for nausea, patient states medication relieved symptom at the time but nausea is returning now.

## 2017-06-03 NOTE — Consult Note (Signed)
[facility] Renal Consultation Note    Indication for Consultation:  Management of ESRD/hemodialysis; anemia, hypertension/volume and secondary hyperparathyroidism PCP:  HPI: [name] is a 51 y.o. female with ESRD on HD TTS, Type 2 DM, diabetic neuropathy, HTN, combined CHF,  Hepatitis C, bipolar disorder with schizoaffective disorder, polysubstance abuse, hx GI bleed.   Presented to ED with progressive leg weakness and vision changes/dizziness when standing x 3-4 days. Missed her last dialysis session on Saturday d/t leg weakness, nausea. Today she was unable to stand without feeling like legs "giving out". In the ED found to be hyperkalemic with K+ 7.3. Ca+/insulin/albuterol started in ED and we are consulted for urgent dialysis. Labs also significant for glucose 646, anion gap 17. CXR showing pulmonary interstitial edema, bilateral pleural effusions.   Seen in ED.  Main complaint is bilateral leg weakness/pain. Unable to maintain balance with walker. ROS positive for dizziness, vision changes, HA, CP, SOB, abdominal pain, N,V, leg weakness.   Dialyzes at [facility] TTS. Last HD was Thursday 10/25, completed full treatment. Intermittently missing treatments d/t feeling "sick".    Past Medical History:  Diagnosis Date  . Anemia   . Anxiety   . Bipolar disorder, unspecified ([place]) 02/06/2014  . Chronic combined systolic and diastolic CHF (congestive heart failure) (HCC)    EF 40% by echo and 48% by stress test  . Chronic pain syndrome   . Depression   . ESRD on hemodialysis ([place])   . GERD (gastroesophageal reflux disease)   . Glaucoma   . Headache    sinus headaches  . Hepatitis C   . High cholesterol   . History of hiatal hernia   . History of vitamin D deficiency 06/20/2015  . Hypertension   . MRSA infection ?2014   "on the back of my head; spread throughout my bloodstream"  . Neuropathy   . Refusal of blood transfusions as patient is  Jehovah's Witness   . Schizoaffective disorder, unspecified condition 02/08/2014  . Seizure [facility]) dx'd 2014   "don't know what kind; last one was ~ 04/2014"  . Stroke [facility])    TIA 2009  . TIA (transient ischemic attack) 2010  . Type II diabetes mellitus ([place])    INSULIN DEPENDENT   Past Surgical History:  Procedure Laterality Date  . ABDOMINAL HYSTERECTOMY  2014  . ABDOMINAL HYSTERECTOMY  2010  . AMPUTATION Left 05/21/2013   Procedure: Left Midfoot AMPUTATION;  Surgeon: Newt Minion, MD;  Location: [place];  Service: Orthopedics;  Laterality: Left;  Left Midfoot Amputation  . AV FISTULA PLACEMENT Left 05/04/2015   Procedure: ARTERIOVENOUS (AV) GRAFT INSERTION;  Surgeon: Angelia Mould, MD;  Location: [place];  Service: Vascular;  Laterality: Left;  . COLONOSCOPY WITH PROPOFOL N/A 06/22/2013   Procedure: COLONOSCOPY WITH PROPOFOL;  Surgeon: Jeryl Columbia, MD;  Location: [facility] ENDOSCOPY;  Service: Endoscopy;  Laterality: N/A;  . ESOPHAGOGASTRODUODENOSCOPY N/A 05/11/2015   Procedure: ESOPHAGOGASTRODUODENOSCOPY (EGD);  Surgeon: Laurence Spates, MD;  Location: [facility] ENDOSCOPY;  Service: Endoscopy;  Laterality: N/A;  . EYE SURGERY Bilateral 2010   Lasik  . FLEXIBLE SIGMOIDOSCOPY N/A 01/17/2017   Procedure: FLEXIBLE SIGMOIDOSCOPY w/ hemorrhoid banding possible;  Surgeon: Gatha Mayer, MD;  Location: [facility] ENDOSCOPY;  Service: Endoscopy;  Laterality: N/A;  . FOOT AMPUTATION Left    due diabetic neuropathy, could not feel ulcer on bottom of foot  . REFRACTIVE SURGERY Bilateral 2013  . TONSILLECTOMY  1970's  . TUBAL LIGATION  2000   Family History  Problem Relation Age of Onset  . Hypertension Mother   . Hypertension Father    Social History:  reports that she quit smoking about 2 years ago. Her smoking use included Cigarettes. She has a 20.00 pack-year smoking history. She has never used smokeless tobacco. She reports that she drinks about 1.8 oz of alcohol per week . She reports that she uses  drugs, including "Crack" cocaine and Cocaine. Allergies  Allergen Reactions  . Penicillins Hives and Other (See Comments)    Has patient had a PCN reaction causing immediate rash, facial/tongue/throat swelling, SOB or lightheadedness with hypotension: Yes Has patient had a PCN reaction causing severe rash involving mucus membranes or skin necrosis: No Has patient had a PCN reaction that required hospitalization No Has patient had a PCN reaction occurring within the last 10 years: No If all of the above answers are "NO", then may proceed with Cephalosporin use. Pt tolerates cefepime and ceftriaxone  . Gabapentin Itching and Swelling  . Lyrica [Pregabalin] Swelling and Other (See Comments)    Facial and leg swelling  . Saphris [Asenapine] Swelling and Other (See Comments)    Facial swelling  . Tramadol Swelling and Other (See Comments)    Leg swelling  . Other Other (See Comments)    NO BLOOD PRODUCTS. PT IS A JEHOVAH WITNESS   Prior to Admission medications   Medication Sig Start Date End Date Taking? Authorizing Provider  albuterol (PROVENTIL HFA;VENTOLIN HFA) 108 (90 Base) MCG/ACT inhaler Inhale 2 puffs into the lungs every 4 (four) hours as needed for wheezing or shortness of breath. 10/15/16  Yes Horton, Barbette Hair, MD  allopurinol (ZYLOPRIM) 100 MG tablet Take 1 tablet (100 mg total) by mouth every Tuesday, Thursday, and Saturday at 6 PM. 01/25/17  Yes Nita Sells, MD  amLODipine (NORVASC) 10 MG tablet Take 1 tablet (10 mg total) by mouth at bedtime. 09/02/16  Yes Hildred Priest, MD  atorvastatin (LIPITOR) 40 MG tablet Take 1 tablet (40 mg total) by mouth at bedtime. 09/02/16  Yes Hildred Priest, MD  calcium acetate (PHOSLO) 667 MG capsule Take 2 capsules (1,334 mg total) by mouth 3 (three) times daily with meals. Patient taking differently: Take 667 mg by mouth 3 (three) times daily with meals.  09/02/16  Yes Hildred Priest, MD  carvedilol  (COREG) 25 MG tablet Take 1 tablet (25 mg total) by mouth 2 (two) times daily with a meal. 09/02/16  Yes Hildred Priest, MD  cloNIDine (CATAPRES) 0.1 MG tablet Take 1 tablet (0.1 mg total) by mouth every 8 (eight) hours. 09/02/16  Yes Hildred Priest, MD  diphenhydrAMINE (BENADRYL) 25 mg capsule Take 25 mg by mouth every 6 (six) hours as needed for itching. Patient is given one capsule on dialysis days   Yes [provider]  Ferrous Sulfate (SLOW FE PO) Take 60 tablets by mouth daily.   Yes [provider]  furosemide (LASIX) 40 MG tablet Take 40 mg by mouth daily. 05/27/17  Yes [provider]  hydrocortisone (ANUSOL-HC) 2.5 % rectal cream Place rectally 2 (two) times daily. 01/24/17  Yes Nita Sells, MD  insulin aspart (NOVOLOG) 100 UNIT/ML injection Inject 5 Units into the skin 3 (three) times daily with meals. 09/03/16  Yes Hildred Priest, MD  insulin glargine (LANTUS) 100 UNIT/ML injection Inject 0.13 mLs (13 Units total) into the skin at bedtime. 01/24/17  Yes Nita Sells, MD  multivitamin (RENA-VIT) TABS tablet Take 1 tablet by mouth at  bedtime. 09/02/16  Yes Hildred Priest, MD  mupirocin ointment (BACTROBAN) 2 % Apply 1 application topically daily. 03/06/17  Yes [provider]  sertraline (ZOLOFT) 50 MG tablet Take 1 tablet (50 mg total) by mouth daily. 09/02/16  Yes Hildred Priest, MD  traZODone (DESYREL) 50 MG tablet Take 0.5 tablets (25 mg total) by mouth at bedtime. 09/02/16  Yes Hildred Priest, MD  vitamin A 10000 UNIT capsule Take 10,000 Units by mouth daily.   Yes [provider]  vitamin C (ASCORBIC ACID) 500 MG tablet Take 500 mg by mouth daily.   Yes [provider]  haloperidol (HALDOL) 0.5 MG tablet Take 1 tablet (0.5 mg total) by mouth every 8 (eight) hours as needed for agitation. Patient not taking: Reported on 06/03/2017 09/03/16    Hildred Priest, MD  LORazepam (ATIVAN) 2 MG/ML injection Inject 1 mL (2 mg total) into the vein Every Tuesday,Thursday,and Saturday with dialysis. Patient not taking: Reported on 06/03/2017 09/03/16   Hildred Priest, MD  Nutritional Supplements (FEEDING SUPPLEMENT, NEPRO CARB STEADY,) LIQD Take 237 mLs by mouth 3 (three) times daily between meals. 02/28/17   Mikhail, Velta Addison, DO  Oxycodone HCl 10 MG TABS Take 1 tablet (10 mg total) by mouth every 6 (six) hours as needed. Patient not taking: Reported on 06/03/2017 02/28/17   Cristal Ford, DO   Current Facility-Administered Medications  Medication Dose Route Frequency Provider Last Rate Last Dose  . insulin regular (NOVOLIN R,HUMULIN R) 100 Units in sodium chloride 0.9 % 100 mL (1 Units/mL) infusion   Intravenous Continuous Recardo Evangelist, PA-C       Current Outpatient Prescriptions  Medication Sig Dispense Refill  . albuterol (PROVENTIL HFA;VENTOLIN HFA) 108 (90 Base) MCG/ACT inhaler Inhale 2 puffs into the lungs every 4 (four) hours as needed for wheezing or shortness of breath. 1 Inhaler 0  . allopurinol (ZYLOPRIM) 100 MG tablet Take 1 tablet (100 mg total) by mouth every Tuesday, Thursday, and Saturday at 6 PM. 60 tablet 1  . amLODipine (NORVASC) 10 MG tablet Take 1 tablet (10 mg total) by mouth at bedtime. 30 tablet 0  . atorvastatin (LIPITOR) 40 MG tablet Take 1 tablet (40 mg total) by mouth at bedtime. 30 tablet 0  . calcium acetate (PHOSLO) 667 MG capsule Take 2 capsules (1,334 mg total) by mouth 3 (three) times daily with meals. (Patient taking differently: Take 667 mg by mouth 3 (three) times daily with meals. ) 240 capsule 0  . carvedilol (COREG) 25 MG tablet Take 1 tablet (25 mg total) by mouth 2 (two) times daily with a meal. 60 tablet 0  . cloNIDine (CATAPRES) 0.1 MG tablet Take 1 tablet (0.1 mg total) by mouth every 8 (eight) hours. 90 tablet 11  . diphenhydrAMINE (BENADRYL) 25 mg capsule Take 25 mg by  mouth every 6 (six) hours as needed for itching. Patient is given one capsule on dialysis days    . Ferrous Sulfate (SLOW FE PO) Take 60 tablets by mouth daily.    . furosemide (LASIX) 40 MG tablet Take 40 mg by mouth daily.    . hydrocortisone (ANUSOL-HC) 2.5 % rectal cream Place rectally 2 (two) times daily. 30 g 0  . insulin aspart (NOVOLOG) 100 UNIT/ML injection Inject 5 Units into the skin 3 (three) times daily with meals. 5 mL 0  . insulin glargine (LANTUS) 100 UNIT/ML injection Inject 0.13 mLs (13 Units total) into the skin at bedtime. 7 mL 0  . multivitamin (RENA-VIT) TABS tablet  Take 1 tablet by mouth at bedtime. 30 tablet 0  . mupirocin ointment (BACTROBAN) 2 % Apply 1 application topically daily.  0  . sertraline (ZOLOFT) 50 MG tablet Take 1 tablet (50 mg total) by mouth daily. 30 tablet 0  . traZODone (DESYREL) 50 MG tablet Take 0.5 tablets (25 mg total) by mouth at bedtime. 30 tablet 0  . vitamin A 10000 UNIT capsule Take 10,000 Units by mouth daily.    . vitamin C (ASCORBIC ACID) 500 MG tablet Take 500 mg by mouth daily.    . haloperidol (HALDOL) 0.5 MG tablet Take 1 tablet (0.5 mg total) by mouth every 8 (eight) hours as needed for agitation. (Patient not taking: Reported on 06/03/2017) 30 tablet 0  . LORazepam (ATIVAN) 2 MG/ML injection Inject 1 mL (2 mg total) into the vein Every Tuesday,Thursday,and Saturday with dialysis. (Patient not taking: Reported on 06/03/2017) 13 mL 0  . Nutritional Supplements (FEEDING SUPPLEMENT, NEPRO CARB STEADY,) LIQD Take 237 mLs by mouth 3 (three) times daily between meals. 90 Can 0  . Oxycodone HCl 10 MG TABS Take 1 tablet (10 mg total) by mouth every 6 (six) hours as needed. (Patient not taking: Reported on 06/03/2017) 10 tablet 0    ROS: As per HPI otherwise negative.  Physical Exam: Vitals:   06/03/17 1300 06/03/17 1330 06/03/17 1349 06/03/17 1400  BP: (!) 161/87 (!) 167/86  (!) 141/92  Pulse: 67 68  65  Resp: 13 12  18   Temp:       TempSrc:      SpO2: 100% 100% 100% 100%  Weight:      Height:         General: WDWN female, uncomfortable but NAD, wearing [place] Head: NCAT sclera not icteric MMM Neck: Supple. No JVD No masses Lungs: coarse crackles throughout Heart: RRR with S1 S2 Abdomen: BS+ soft, diffusely tender, no guarding or rebound tenderness  Lower extremities:  1+ LE edema  bilat; L foot s/t TMA, superficial plantar blister  Neuro: A & O  X 3. Moves all extremities spontaneously. Psych:  Responds to questions appropriately with a normal affect. Dialysis Access: LUE AVG +bruit   Labs: Basic Metabolic Panel:  Recent Labs Lab 06/03/17 1213  NA 120*  K 7.3*  CL 80*  CO2 23  GLUCOSE 646*  BUN 50*  CREATININE 6.59*  CALCIUM 8.6*   Liver Function Tests:  Recent Labs Lab 06/03/17 1213  AST 29  ALT 27  ALKPHOS 121  BILITOT 0.9  PROT 7.1  ALBUMIN 3.0*   No results for input(s): LIPASE, AMYLASE in the last 168 hours. No results for input(s): AMMONIA in the last 168 hours. CBC:  Recent Labs Lab 06/03/17 1213  WBC 3.0*  NEUTROABS 2.2  HGB 9.7*  HCT 29.3*  MCV 89.3  PLT 129*   Cardiac Enzymes: No results for input(s): CKTOTAL, CKMB, CKMBINDEX, TROPONINI in the last 168 hours. CBG: No results for input(s): GLUCAP in the last 168 hours. Iron Studies: No results for input(s): IRON, TIBC, TRANSFERRIN, FERRITIN in the last 72 hours. Studies/Results: Dg Chest 2 View  Result Date: 06/03/2017 CLINICAL DATA:  Chest pain.  Shortness of breath.  Arm and leg pain. EXAM: CHEST  2 VIEW COMPARISON:  02/25/2017 . FINDINGS: Mediastinum and hilar structures are normal . Cardiomegaly with mild pulmonary vascular prominence, interstitial interstitial prominence, and bilateral pleural effusions. Findings suggest CHF. Findings have progressed prior exam inter consistent CHF. Right base infiltrate/pneumonia cannot be excluded . IMPRESSION:  Progressive changes of congestive heart failure with pulmonary  interstitial edema and bilateral pleural effusions. 2. Right lower lobe infiltrate/pneumonia cannot be excluded. Electronically Signed   By: Marcello Moores  Register   On: 06/03/2017 11:28    Dialysis Orders:  [facility] TThS 3.5h 180F 400/800 EDW 63kg 2K/2.25Ca Profile 2  LUE AVG No Heparin Hectorol 18mcg IV q HD Mircera 262mcg IV q 2 weeks (last dosed 10/23) BMM: Ca acetate 2 tabs q ac, Renvela 3 q ac  Outpatient Labs 10/25: Hgb 9.1 Phos 4.8 Ca 9.4 PTH 243   Assessment/Plan: 1. Hyperkalemia - K+7.3 on admission -secondary to missed dialysis. Ca/insulin/albuterol started in ED. Plan urgent HD today to correct. Follow labs  2. Hyperglycemia/DM - Glucose 646 with anion gap 17 - insulin gtt started -  per primary  3. Leg pain/weakness/malaise - likely related to above metabolic abnormalities 4. ESRD -  TTS. For urgent HD today.  5. Hypertension/volume  - BP elevated/Volume excess on exam/CXR - UF goal 3L with HD today and follow weights  6. Anemia  - Hgb 9.7, Follow trend, on ESA as OP 7. Metabolic bone disease -  Ca ok. Cont VDRA/binders 8. Nutrition - Renal diet/vitamins    Lynnda Child PA-C [facility] Pager [phone] 06/03/2017, 2:41 PM   Pt seen, examined and agree w A/P as above. ESRD pt missed HD Sat, here with leg weakness and severe hyperkalemia.  Will plan HD asap today.  Not in distress.  4kg up. Plan as above.  Kelly Splinter MD [employer] pager [phone]   06/03/2017, 3:45 PM

## 2017-06-03 NOTE — H&P (Signed)
Date: 06/03/2017               Patient Name:  [name] MRN: [id number]  DOB: [birth year] Age / Sex: 51 y.o., female   PCP: Maurice Small, MD         Medical Service: Internal Medicine Teaching Service         Attending Physician: Dr. Carmin Muskrat, MD    First Contact: Dr. Tarri Abernethy Pager: [phone]  Second Contact: Dr. Heber Tucker Pager: [phone]       After Hours (After 5p/  First Contact Pager: [phone]  weekends / holidays): Second Contact Pager: [phone]   Chief Complaint: Dizziness  History of Present Illness: [name] is a 51 y.o. Female with a PMHx significant for ESRD on HD TTS, HFrEF, DM, and prior CVA who presented to the ED with gait instability and dizziness of 4-5 days duration. Missed HD Saturday due to instability and feeling like the room was tilting to the right. This feeling has persisted. It seems to be relieved by lying or sitting still, and improves with closing her eyes or focusing on a single point. She denies recent URI or hearing loss/tinnitus. She endorses vision changes with this dizziness/off-balance describing it as tunnel vision but states that it has improved since coming to the ED. Additional associated symptoms include N/V.  Since missing her HD session of Saturday things have gotten progressively worse and she is now experiencing HA, chest pain, shortness of breath, and LE weakness. She states that she has missed HD sessions in the past and this is similar to how she felt. It was clarified that these symptoms occurred after missing her HD session and are not the reason she missed her HD session. In the ED she was found to have a CBG >600. She reports that her glucometer at home broke and she has been unable to check her blood sugars. She typically does a sliding scale but since she could not check her CBG she has not used insulin since Saturday.   ED Course: Found to be hemodynamically stable. Labs significant for: Na 129 (corrected), potassium 7.3  (associated peaked T wave on EKG), CO2 23, AG 17, BNP 2212. CXR illustrated increased pulmonary vascular congestion and bilateral pleural effusion. Nephrology was consulted from the ED for HD.   Meds:  Current Meds  Medication Sig  . albuterol (PROVENTIL HFA;VENTOLIN HFA) 108 (90 Base) MCG/ACT inhaler Inhale 2 puffs into the lungs every 4 (four) hours as needed for wheezing or shortness of breath.  . allopurinol (ZYLOPRIM) 100 MG tablet Take 1 tablet (100 mg total) by mouth every Tuesday, Thursday, and Saturday at 6 PM.  . amLODipine (NORVASC) 10 MG tablet Take 1 tablet (10 mg total) by mouth at bedtime.  Marland Kitchen atorvastatin (LIPITOR) 40 MG tablet Take 1 tablet (40 mg total) by mouth at bedtime.  . calcium acetate (PHOSLO) 667 MG capsule Take 2 capsules (1,334 mg total) by mouth 3 (three) times daily with meals. (Patient taking differently: Take 667 mg by mouth 3 (three) times daily with meals. )  . carvedilol (COREG) 25 MG tablet Take 1 tablet (25 mg total) by mouth 2 (two) times daily with a meal.  . cloNIDine (CATAPRES) 0.1 MG tablet Take 1 tablet (0.1 mg total) by mouth every 8 (eight) hours.  . diphenhydrAMINE (BENADRYL) 25 mg capsule Take 25 mg by mouth every 6 (six) hours as needed for itching. Patient is given one capsule on dialysis days  .  Ferrous Sulfate (SLOW FE PO) Take 60 tablets by mouth daily.  . furosemide (LASIX) 40 MG tablet Take 40 mg by mouth daily.  . hydrocortisone (ANUSOL-HC) 2.5 % rectal cream Place rectally 2 (two) times daily.  . insulin aspart (NOVOLOG) 100 UNIT/ML injection Inject 5 Units into the skin 3 (three) times daily with meals.  . insulin glargine (LANTUS) 100 UNIT/ML injection Inject 0.13 mLs (13 Units total) into the skin at bedtime.  . multivitamin (RENA-VIT) TABS tablet Take 1 tablet by mouth at bedtime.  . mupirocin ointment (BACTROBAN) 2 % Apply 1 application topically daily.  . sertraline (ZOLOFT) 50 MG tablet Take 1 tablet (50 mg total) by mouth daily.  .  traZODone (DESYREL) 50 MG tablet Take 0.5 tablets (25 mg total) by mouth at bedtime.  . vitamin A 10000 UNIT capsule Take 10,000 Units by mouth daily.  . vitamin C (ASCORBIC ACID) 500 MG tablet Take 500 mg by mouth daily.   Allergies: Allergies as of 06/03/2017 - Review Complete 06/03/2017  Allergen Reaction Noted  . Penicillins Hives and Other (See Comments) 09/30/2014  . Gabapentin Itching and Swelling 02/09/2013  . Lyrica [pregabalin] Swelling and Other (See Comments) 09/30/2014  . Saphris [asenapine] Swelling and Other (See Comments) 09/30/2014  . Tramadol Swelling and Other (See Comments) 09/30/2014  . Other Other (See Comments) 05/01/2015   Past Medical History:  Diagnosis Date  . Anemia   . Anxiety   . Bipolar disorder, unspecified ([place]) 02/06/2014  . Chronic combined systolic and diastolic CHF (congestive heart failure) (HCC)    EF 40% by echo and 48% by stress test  . Chronic pain syndrome   . Depression   . ESRD on hemodialysis ([place])   . GERD (gastroesophageal reflux disease)   . Glaucoma   . Headache    sinus headaches  . Hepatitis C   . High cholesterol   . History of hiatal hernia   . History of vitamin D deficiency 06/20/2015  . Hypertension   . MRSA infection ?2014   "on the back of my head; spread throughout my bloodstream"  . Neuropathy   . Refusal of blood transfusions as patient is Jehovah's Witness   . Schizoaffective disorder, unspecified condition 02/08/2014  . Seizure [facility]) dx'd 2014   "don't know what kind; last one was ~ 04/2014"  . Stroke [facility])    TIA 2009  . TIA (transient ischemic attack) 2010  . Type II diabetes mellitus ([place])    INSULIN DEPENDENT   Family History:  Mother: + HTN  Father: + HTN  Social History:  Endorses the use of EtOH and illicit substances (cocaine and crack cocaine)  Former smoker  Review of Systems: A complete ROS was negative except as per HPI.   Physical Exam: Blood pressure (!) 169/79, pulse 67, temperature (!)  97.5 F (36.4 C), temperature source Oral, resp. rate 15, height 5\' 4"  (1.626 m), weight 149 lb (67.6 kg), SpO2 100 %.  General: Thin female breathing heavily on continuous albuterol  HENT: Atraumatic, normocephalic, moist mucus membranes  Pulm: Good air movement with bibasilar crackles  CV: RRR, normal S1 and S2, no JVD Abdomen: Active bowel sounds, soft, non-distended, no tenderness to palpation  Extremities: Trace pitting edema of the LE bilaterally, left midfoot amputation  Skin: Multiple scars on the extremities and back  Neuro:  - Cranial nerves II-XII intact bilaterally, no nystagmus noted  - Gross motor strength 5/5 in upper and lower extremities  - Gross sensation intact  in upper and lower extremities   EKG: personally reviewed: my interpretation is Sinus, normal intervals, right axis deviation, and peaked T waves  CXR: personally reviewed: my interpretation is increased pulmonary vascular congestion and right pleural effusion   Assessment & Plan by Problem: Active Problems:   Dizziness   ESRD (end stage renal disease) (HCC)   Volume overload  1. Vertigo / Dizziness. Patient's presenting symptoms are consistent with central vertigo including persistent feeling for days, impairment of balance/gait, no associated hearing loss or tinnitus, N/V, and visual changes. These symptoms are consistent with a posterior circulation stroke and further evaluation with CT head/ MRI should be pursued. Her presentation is complicated by signs and symptoms of acute volume overload from a missed HD session and possible DKA in the setting of not using her insulin. She will go for emergent HD per nephrology and after which her symptoms should be reassessed. It is possible the instability and dizziness are a result of uremia. Her lack of tinnitus or hearing loss make peripheral causes of vertigo less likely. Other causes on the differental include arrhythmias and vestibular migraine. Therefore she will be  admitted to telemetry.  - Admit to telemetry  - CT Head  - MRI Head  - Emergent HD  - HiNTs Exam after HD   2. Volume overload  - Signs and sympotms of volume overload after missing HD on Saturday due to vertigo  - History of combined systolic and diastolic HF. Recent echo with EF 45-50% with G2DD and diffuse hypokineses - Emergent HD tonight   3. ESRD on HD TTS - Missed HD due to vertigo  - Presented with sings and symptoms of volume overload. Along with hyperkalemia and associated EKG findings. Treated with continuous albuterol, insulin, and calcium gluconate until HD - Pertinent labs: K 7.3, CO2 23, AG 17, Ca 8.6, Hgb 9.7  - Will go for emergent HD per nephrology   4. Combined Systolic and Diastolic HF - Volume overload, going for emergent HD  - Home regimen includes Atorvastatin 40 mg QD, Coreg 25 mg BID, Lasix 40 mg QD. Will resume after she is able to tolerate PO intake  5. Type 2 DM - Presented with hyperglycemia and HAGMA in the setting of not using her insulin  - Volume overload on exam. Started on insulin drip in the ED - Home regimen: Lantus 13 units QHS, and Novolog 5 units with meals  - Will need close monitoring s/p HD. Will likely need to come of the insulin drip.  [facility] regimen will be Lantus 7 units with SSI  - Checking serum ketones  6. Hypertension  - Home regimen includes Amlodipine 5 mg QD, Clonidine 0.1 mg TID. Will restart once able to tolerate PO intake   Diet: NPO VTE ppx: SubQ Heparin  Code Status: Full   Dispo: Admit patient to Inpatient with expected length of stay greater than 2 midnights.  SignedIna Homes, MD 06/03/2017, 4:52 PM  My Pager: [phone]

## 2017-06-03 NOTE — ED Provider Notes (Signed)
[facility] EMERGENCY DEPARTMENT Provider Note   CSN: [id number] Arrival date & time: 06/03/17  1017     History   Chief Complaint Chief Complaint  Patient presents with  . Shortness of Breath    HPI [name] is a 51 y.o. female who presents with multiple complaints. 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   1: Leg weakness/pain: The patient states her main complaint is her lower leg weakness. This is an ongoing issue for her and she states this is the reason why she has been unable to go to her dialysis sessions and has been cutting them short. Her last dialysis session was 5 days ago. She states that she feels pain from her feet to her calves bilaterally. When she stands she feels pain in her thighs as well. She has been using her walker but still has difficulty keeping her balance. She also has swelling in the lower legs.  2: SOB: She states this is intermittent and has been going on for "awhile". It is worse with lying flat and exertion. She reports associated productive cough of thick, white phlegm for three days. She reports associated left sided chest pain for the past week. She states it feels like "gas pain".   3: Headache/Vision change: She reports decreased vision in her right eye as well as a right sided headache for the past two days. She states that the headache feels similar to a migraine. Her vision is blurry and "white and fuzzy". She also feels like her vision is constricted on the right side.   4: Left foot wound: She noticed drainage from her left foot yesterday when it was coming out of her sock.  She is unsure of any trauma but noticed this because of the drainage.   5: Skin sores/itching: She has been having a problem with this for ~6 months. There are sores all on her back, scalp, and lower legs. They are itchy and painful. She has a hard time not scratching the areas.   6: Hyperglycemia: She states she has not been using her insulin because she does not have a working glucometer. This has been for "awhile". She has had nausea and vomiting for the past three days.  HPI  Past Medical History:  Diagnosis Date  . Anemia   . Anxiety   . Bipolar disorder, unspecified ([place]) 02/06/2014  . Chronic combined systolic and diastolic CHF (congestive heart failure) (HCC)    EF 40% by echo and 48% by stress test  . Chronic pain syndrome   . Depression   . ESRD on hemodialysis ([place])   . GERD (gastroesophageal reflux disease)   . Glaucoma   . Headache    sinus headaches  . Hepatitis C   . High cholesterol   . History of hiatal hernia   . History of vitamin D deficiency 06/20/2015  . Hypertension   . MRSA infection ?2014   "on the back of my head; spread throughout my bloodstream"  . Neuropathy   . Refusal of blood transfusions as patient is Jehovah's Witness   . Schizoaffective disorder, unspecified condition 02/08/2014  . Seizure [facility]) dx'd 2014   "don't know what kind; last one was ~ 04/2014"  .  Stroke [facility])    TIA 2009  . TIA (transient ischemic attack) 2010  . Type II diabetes mellitus ([place])    INSULIN DEPENDENT    Patient Active Problem List   Diagnosis Date Noted  . Right supracondylar humerus fracture, with nonunion, subsequent encounter 05/01/2017  . Malnutrition of moderate degree 02/28/2017  . Leg pain, left 02/25/2017  . Left leg pain 02/25/2017  . External hemorrhoids   . Diverticulosis of colon without hemorrhage   . Goals of care, counseling/discussion   . Palliative care encounter   . Cellulitis of left leg 01/16/2017  . Diabetes mellitus with complication ([place])  73/71/0626  . Upper GI bleed 01/16/2017  . UGIB (upper gastrointestinal bleed)   . Anemia due to chronic kidney disease   . Cellulitis of left thigh 01/01/2017  . ESRD (end stage renal disease) ([place]) 01/01/2017  . Cocaine use disorder, severe, dependence ([place]) 08/29/2016  . Hepatitis C 08/29/2016  . Diabetic ulcer of calf ([place]) 08/29/2016  . Diabetic ulcer of toe ([place]) 08/29/2016  . Cerebrovascular accident (CVA) ([place]) 08/29/2016  . MDD (major depressive disorder) 08/28/2016  . Dyslipidemia associated with type 2 diabetes mellitus ([place]) 11/10/2015  . History of vitamin D deficiency 06/20/2015  . Long term prescription opiate use 06/20/2015  . Chronic pain syndrome 06/19/2015  . Anemia of chronic kidney failure 05/01/2015  . transmetatarsal amputation of the left foot 12/13/2014  . Unilateral visual loss 11/22/2014  . 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 ([place]) 11/22/2014  . Essential hypertension 11/09/2014  . Chronic combined systolic (congestive) and diastolic (congestive) heart failure ([place])   . Rectal bleeding 04/23/2014  . Anemia, iron deficiency 04/13/2014  . Noncompliance 02/02/2014  . Hyponatremia 05/19/2013  . CKD (chronic kidney disease) stage 5, GFR less than 15 ml/min (HCC) 05/18/2013  . Seizure disorder ([place]) 02/09/2013    Past Surgical History:  Procedure Laterality Date  . ABDOMINAL HYSTERECTOMY  2014  . ABDOMINAL HYSTERECTOMY  2010  . AMPUTATION Left 05/21/2013   Procedure: Left Midfoot AMPUTATION;  Surgeon: Newt Minion, MD;  Location: ;  Service: Orthopedics;  Laterality: Left;  Left Midfoot Amputation  . AV FISTULA PLACEMENT Left 05/04/2015   Procedure: ARTERIOVENOUS (AV) GRAFT INSERTION;  Surgeon: Angelia Mould, MD;  Location: [facility];  Service: Vascular;  Laterality: Left;  . COLONOSCOPY WITH PROPOFOL N/A 06/22/2013   Procedure: COLONOSCOPY WITH PROPOFOL;  Surgeon: Jeryl Columbia, MD;  Location: [facility] ENDOSCOPY;  Service: Endoscopy;   Laterality: N/A;  . ESOPHAGOGASTRODUODENOSCOPY N/A 05/11/2015   Procedure: ESOPHAGOGASTRODUODENOSCOPY (EGD);  Surgeon: Laurence Spates, MD;  [facility];  Service: Endoscopy;  Laterality: N/A;  . EYE SURGERY Bilateral 2010   Lasik  . FLEXIBLE SIGMOIDOSCOPY N/A 01/17/2017   Procedure: FLEXIBLE SIGMOIDOSCOPY w/ hemorrhoid banding possible;  Surgeon: Gatha Mayer, MD;  Location: [facility] ENDOSCOPY;  Service: Endoscopy;  Laterality: N/A;  . FOOT AMPUTATION Left    due diabetic neuropathy, could not feel ulcer on bottom of foot  . REFRACTIVE SURGERY Bilateral 2013  . TONSILLECTOMY  1970's  . TUBAL LIGATION  2000    OB History    Gravida Para Term Preterm AB Living   0 0 0 0 0     SAB TAB Ectopic Multiple Live Births   0 0 0           Home Medications    Prior to Admission medications   Medication Sig Start Date End Date Taking? Authorizing Provider  albuterol (PROVENTIL  HFA;VENTOLIN HFA) 108 (90 Base) MCG/ACT inhaler Inhale 2 puffs into the lungs every 4 (four) hours as needed for wheezing or shortness of breath. 10/15/16   Merryl Hacker, MD  allopurinol (ZYLOPRIM) 100 MG tablet Take 1 tablet (100 mg total) by mouth every Tuesday, Thursday, and Saturday at 6 PM. 01/25/17   Nita Sells, MD  amLODipine (NORVASC) 10 MG tablet Take 1 tablet (10 mg total) by mouth at bedtime. 09/02/16   Hildred Priest, MD  atorvastatin (LIPITOR) 40 MG tablet Take 1 tablet (40 mg total) by mouth at bedtime. 09/02/16   Hildred Priest, MD  calcium acetate (PHOSLO) 667 MG capsule Take 2 capsules (1,334 mg total) by mouth 3 (three) times daily with meals. 09/02/16   Hildred Priest, MD  carvedilol (COREG) 25 MG tablet Take 1 tablet (25 mg total) by mouth 2 (two) times daily with a meal. 09/02/16   Hildred Priest, MD  cloNIDine (CATAPRES) 0.1 MG tablet Take 1 tablet (0.1 mg total) by mouth every 8 (eight) hours. 09/02/16   Hildred Priest, MD    haloperidol (HALDOL) 0.5 MG tablet Take 1 tablet (0.5 mg total) by mouth every 8 (eight) hours as needed for agitation. Patient taking differently: Take 0.5 mg by mouth at bedtime.  09/03/16   Hildred Priest, MD  hydrocortisone (ANUSOL-HC) 2.5 % rectal cream Place rectally 2 (two) times daily. 01/24/17   Nita Sells, MD  insulin aspart (NOVOLOG) 100 UNIT/ML injection Inject 5 Units into the skin 3 (three) times daily with meals. 09/03/16   Hildred Priest, MD  insulin glargine (LANTUS) 100 UNIT/ML injection Inject 0.13 mLs (13 Units total) into the skin at bedtime. 01/24/17   Nita Sells, MD  LORazepam (ATIVAN) 2 MG/ML injection Inject 1 mL (2 mg total) into the vein Every Tuesday,Thursday,and Saturday with dialysis. 09/03/16   Hildred Priest, MD  multivitamin (RENA-VIT) TABS tablet Take 1 tablet by mouth at bedtime. 09/02/16   Hildred Priest, MD  Nutritional Supplements (FEEDING SUPPLEMENT, NEPRO CARB STEADY,) LIQD Take 237 mLs by mouth 3 (three) times daily between meals. 02/28/17   Mikhail, Velta Addison, DO  Oxycodone HCl 10 MG TABS Take 1 tablet (10 mg total) by mouth every 6 (six) hours as needed. 02/28/17   Mikhail, Velta Addison, DO  sertraline (ZOLOFT) 50 MG tablet Take 1 tablet (50 mg total) by mouth daily. 09/02/16   Hildred Priest, MD  traZODone (DESYREL) 50 MG tablet Take 0.5 tablets (25 mg total) by mouth at bedtime. 09/02/16   Hildred Priest, MD    Family History Family History  Problem Relation Age of Onset  . Hypertension Mother   . Hypertension Father     Social History Social History  Substance Use Topics  . Smoking status: Former Smoker    Packs/day: 1.00    Years: 20.00    Types: Cigarettes    Quit date: 03/31/2015  . Smokeless tobacco: Never Used     Comment: "stopped smoking in ~ 2013; restarted last week; stopped again 06/17/2014"  . Alcohol use 1.8 oz/week    3 Standard drinks or equivalent per  week     Comment: 05/01/15 patient denies alcohol use.     Allergies   Penicillins; Gabapentin; Lyrica [pregabalin]; Saphris [asenapine]; Tramadol; and Other   Review of Systems Review of Systems  Constitutional: Negative for appetite change, chills and fever.  Eyes: Positive for pain, discharge and visual disturbance.  Respiratory: Positive for cough and shortness of breath. Negative for wheezing.   Cardiovascular: Positive for chest  pain and leg swelling.  Gastrointestinal: Positive for abdominal distention, abdominal pain (left sided), diarrhea ("little bit"), nausea and vomiting.  Genitourinary: Positive for difficulty urinating (anuric).  Musculoskeletal: Positive for arthralgias and gait problem. Negative for back pain.  Skin: Positive for rash (sores).       +itching  Neurological: Positive for weakness (generalized) and headaches. Negative for syncope.  All other systems reviewed and are negative.    Physical Exam Updated Vital Signs BP (!) 168/76 (BP Location: Right Arm)   Pulse 66   Temp (!) 97.5 F (36.4 C) (Oral)   Resp 13   Ht 5\' 4"  (1.626 m)   Wt 67.6 kg (149 lb)   LMP  (LMP Unknown)   SpO2 98%   BMI 25.58 kg/m   Physical Exam  Constitutional: She is oriented to person, place, and time. She appears well-developed and well-nourished. No distress.  Chronically ill appearing, pleasant, NAD  HENT:  Head: Normocephalic and atraumatic.  Eyes: Pupils are equal, round, and reactive to light. Conjunctivae and EOM are normal. Right eye exhibits no discharge. Left eye exhibits no discharge. No scleral icterus.  Visual acuity: Bilateral: 20/32 Right: 20/80 Left: 20/32  IOP: 18 in R, 12 in L  Neck: Normal range of motion.  Cardiovascular: Normal rate and regular rhythm.  Exam reveals no gallop and no friction rub.   No murmur heard. Pulmonary/Chest: Effort normal and breath sounds normal. No respiratory distress. She has no wheezes. She exhibits no tenderness.    Bibasilar crackles  Abdominal: Soft. Bowel sounds are normal. She exhibits distension. She exhibits no mass. There is no tenderness. There is no rebound and no guarding. No hernia.  Musculoskeletal:  Left foot is s/p distal amputation. There is a blister on plantar aspect of foot which is draining serosanguinous fluid  Deformity of distal right humerus from prior supracondylar fracture  Neurological: She is alert and oriented to person, place, and time.  Lying on stretcher in NAD. GCS 15. Speaks in a clear voice. Cranial nerves II through XII grossly intact. 5/5 strength in all extremities. Sensation fully intact.  Bilateral finger-to-nose intact. Gait not tested   Skin: Skin is warm and dry.  Multiple skin lesions scattered over lower legs, scalp, back. They appear excoriated  Psychiatric: She has a normal mood and affect. Her behavior is normal.  Nursing note and vitals reviewed.    ED Treatments / Results  Labs (all labs ordered are listed, but only abnormal results are displayed) Labs Reviewed  COMPREHENSIVE METABOLIC PANEL - Abnormal; Notable for the following:       Result Value   Sodium 120 (*)    Potassium 7.3 (*)    Chloride 80 (*)    Glucose, Bld 646 (*)    BUN 50 (*)    Creatinine, Ser 6.59 (*)    Calcium 8.6 (*)    Albumin 3.0 (*)    GFR calc non Af Amer 7 (*)    GFR calc Af Amer 8 (*)    Anion gap 17 (*)    All other components within normal limits  CBC WITH DIFFERENTIAL/PLATELET - Abnormal; Notable for the following:    WBC 3.0 (*)    RBC 3.28 (*)    Hemoglobin 9.7 (*)    HCT 29.3 (*)    RDW 17.6 (*)    Platelets 129 (*)    Lymphs Abs 0.6 (*)    All other components within normal limits  BRAIN NATRIURETIC PEPTIDE - Abnormal;  Notable for the following:    B Natriuretic Peptide 2,212.3 (*)    All other components within normal limits  I-STAT TROPONIN, ED  I-STAT VENOUS BLOOD GAS, ED    EKG  EKG Interpretation  Date/Time:  Tuesday June 03 2017  12:11:32 EDT Ventricular Rate:  66 PR Interval:    QRS Duration: 105 QT Interval:  457 QTC Calculation: 479 R Axis:   119 Text Interpretation:  Sinus rhythm Right axis deviation Abnormal R-wave progression, late transition Baseline wander in lead(s) II aVF V1 V3 V5 Abnormal ekg Confirmed by Carmin Muskrat [phone]) on 06/03/2017 12:37:17 PM       Radiology Dg Chest 2 View  Result Date: 06/03/2017 CLINICAL DATA:  Chest pain.  Shortness of breath.  Arm and leg pain. EXAM: CHEST  2 VIEW COMPARISON:  02/25/2017 . FINDINGS: Mediastinum and hilar structures are normal . Cardiomegaly with mild pulmonary vascular prominence, interstitial interstitial prominence, and bilateral pleural effusions. Findings suggest CHF. Findings have progressed prior exam inter consistent CHF. Right base infiltrate/pneumonia cannot be excluded . IMPRESSION: Progressive changes of congestive heart failure with pulmonary interstitial edema and bilateral pleural effusions. 2. Right lower lobe infiltrate/pneumonia cannot be excluded. Electronically Signed   By: Marcello Moores  Register   On: 06/03/2017 11:28    Procedures Procedures (including critical care time)  CRITICAL CARE Performed by: Recardo Evangelist   Total critical care time: 35 minutes  Critical care time was exclusive of separately billable procedures and treating other patients.  Critical care was necessary to treat or prevent imminent or life-threatening deterioration.  Critical care was time spent personally by me on the following activities: development of treatment plan with patient and/or surrogate as well as nursing, discussions with consultants, evaluation of patient's response to treatment, examination of patient, obtaining history from patient or surrogate, ordering and performing treatments and interventions, ordering and review of laboratory studies, ordering and review of radiographic studies, pulse oximetry and re-evaluation of patient's  condition.   Medications Ordered in ED Medications  calcium chloride 1 g in sodium chloride 0.9 % 100 mL IVPB (1 g Intravenous New Bag/Given 06/03/17 1405)  insulin regular (NOVOLIN R,HUMULIN R) 100 Units in sodium chloride 0.9 % 100 mL (1 Units/mL) infusion (not administered)  tetracaine (PONTOCAINE) 0.5 % ophthalmic solution 1 drop (1 drop Right Eye Given by Other 06/03/17 1223)  albuterol (PROVENTIL) (2.5 MG/3ML) 0.083% nebulizer solution 10 mg (10 mg Nebulization Given 06/03/17 1349)  ondansetron (ZOFRAN) injection 4 mg (4 mg Intravenous Given 06/03/17 1401)     Initial Impression / Assessment and Plan / ED Course  I have reviewed the triage vital signs and the nursing notes.  Pertinent labs & imaging results that were available during my care of the patient were reviewed by me and considered in my medical decision making (see chart for details).  51 year old female with complex history presents with multiple complaints. She is hypertensive and has transient hypoxia at times and was placed on 2L supplemental O2. Otherwise vitals are normal. On exam patient is volume overloaded - she has bibasilar crackles, abdomen is distended, and she has peripheral edema. CXR shows interstitial edema and bilateral pleural effusions. Labs shows multiple derangements. Potassium is 7.3. EKG does not show any acute changes. Calcium, albuterol, insulin ordered. She also may be in DKA. Glucose is 646 and anion gap is 17. VBG was ordered and is pending however insulin drip was started since patient has been having N/V. Spoke with Nephrology PA who  will consult on patient and will work on arranging emergent dialysis.   Her other concerning complaint is right sided headache and right sided vision changes. Visual acuity is decreased in the R eye but is intact. IOP are normal. She can have further work up for this as inpatient.   For her non-emergent complaints, her foot blister is draining serosanguinous fluid.  Recommend local wound care. For her skin sores, I think this is consistent with picker's papules and she will benefit for anti-itch medicine. Shared visit with Dr. Vanita Panda. Spoke to IM team who will come to see patient.   Final Clinical Impressions(s) / ED Diagnoses   Final diagnoses:  ESRD (end stage renal disease) on dialysis ([place])  Hypervolemia, unspecified hypervolemia type  Hyperkalemia  Rash and nonspecific skin eruption  Diabetic ketoacidosis without coma associated with type 2 diabetes mellitus [facility])    New Prescriptions New Prescriptions   No medications on file     Recardo Evangelist, PA-C 06/03/17 1440    Carmin Muskrat, MD 06/03/17 [phone]

## 2017-06-04 ENCOUNTER — Encounter (HOSPITAL_COMMUNITY): Payer: Self-pay

## 2017-06-04 ENCOUNTER — Inpatient Hospital Stay (HOSPITAL_COMMUNITY): Payer: Medicare Other

## 2017-06-04 DIAGNOSIS — E1165 Type 2 diabetes mellitus with hyperglycemia: Secondary | ICD-10-CM

## 2017-06-04 DIAGNOSIS — Z79899 Other long term (current) drug therapy: Secondary | ICD-10-CM

## 2017-06-04 DIAGNOSIS — R2689 Other abnormalities of gait and mobility: Secondary | ICD-10-CM

## 2017-06-04 DIAGNOSIS — Z9114 Patient's other noncompliance with medication regimen: Secondary | ICD-10-CM

## 2017-06-04 DIAGNOSIS — I132 Hypertensive heart and chronic kidney disease with heart failure and with stage 5 chronic kidney disease, or end stage renal disease: Secondary | ICD-10-CM

## 2017-06-04 DIAGNOSIS — E8779 Other fluid overload: Secondary | ICD-10-CM

## 2017-06-04 DIAGNOSIS — N186 End stage renal disease: Secondary | ICD-10-CM

## 2017-06-04 DIAGNOSIS — H8190 Unspecified disorder of vestibular function, unspecified ear: Secondary | ICD-10-CM

## 2017-06-04 DIAGNOSIS — I5040 Unspecified combined systolic (congestive) and diastolic (congestive) heart failure: Secondary | ICD-10-CM

## 2017-06-04 DIAGNOSIS — E875 Hyperkalemia: Principal | ICD-10-CM

## 2017-06-04 DIAGNOSIS — Z992 Dependence on renal dialysis: Secondary | ICD-10-CM

## 2017-06-04 DIAGNOSIS — E1122 Type 2 diabetes mellitus with diabetic chronic kidney disease: Secondary | ICD-10-CM

## 2017-06-04 DIAGNOSIS — Z794 Long term (current) use of insulin: Secondary | ICD-10-CM

## 2017-06-04 LAB — BASIC METABOLIC PANEL
ANION GAP: 11 (ref 5–15)
ANION GAP: 14 (ref 5–15)
Anion gap: 10 (ref 5–15)
BUN: 17 mg/dL (ref 6–20)
BUN: 17 mg/dL (ref 6–20)
BUN: 18 mg/dL (ref 6–20)
CHLORIDE: 91 mmol/L — AB (ref 101–111)
CHLORIDE: 93 mmol/L — AB (ref 101–111)
CHLORIDE: 92 mmol/L — AB (ref 101–111)
CO2: 27 mmol/L (ref 22–32)
CO2: 24 mmol/L (ref 22–32)
CO2: 29 mmol/L (ref 22–32)
CREATININE: 3.68 mg/dL — AB (ref 0.44–1.00)
Calcium: 8.2 mg/dL — ABNORMAL LOW (ref 8.9–10.3)
Calcium: 8.4 mg/dL — ABNORMAL LOW (ref 8.9–10.3)
Calcium: 8.2 mg/dL — ABNORMAL LOW (ref 8.9–10.3)
Creatinine, Ser: 3.35 mg/dL — ABNORMAL HIGH (ref 0.44–1.00)
Creatinine, Ser: 3.46 mg/dL — ABNORMAL HIGH (ref 0.44–1.00)
GFR calc Af Amer: 17 mL/min — ABNORMAL LOW (ref >60)
GFR calc Af Amer: 15 mL/min — ABNORMAL LOW (ref >60)
GFR calc non Af Amer: 15 mL/min — ABNORMAL LOW (ref >60)
GFR calc non Af Amer: 14 mL/min — ABNORMAL LOW (ref >60)
GFR calc non Af Amer: 13 mL/min — ABNORMAL LOW (ref >60)
GFR, EST AFRICAN AMERICAN: 17 mL/min — AB (ref >60)
GLUCOSE: 92 mg/dL (ref 65–99)
Glucose, Bld: 228 mg/dL — ABNORMAL HIGH (ref 65–99)
Glucose, Bld: 172 mg/dL — ABNORMAL HIGH (ref 65–99)
POTASSIUM: 3.8 mmol/L (ref 3.5–5.1)
POTASSIUM: 4 mmol/L (ref 3.5–5.1)
POTASSIUM: 4 mmol/L (ref 3.5–5.1)
SODIUM: 129 mmol/L — AB (ref 135–145)
SODIUM: 131 mmol/L — AB (ref 135–145)
SODIUM: 131 mmol/L — AB (ref 135–145)

## 2017-06-04 LAB — GLUCOSE, CAPILLARY
GLUCOSE-CAPILLARY: 147 mg/dL — AB (ref 65–99)
GLUCOSE-CAPILLARY: 198 mg/dL — AB (ref 65–99)
GLUCOSE-CAPILLARY: 132 mg/dL — AB (ref 65–99)
GLUCOSE-CAPILLARY: 110 mg/dL — AB (ref 65–99)
GLUCOSE-CAPILLARY: 131 mg/dL — AB (ref 65–99)
GLUCOSE-CAPILLARY: 94 mg/dL (ref 65–99)
GLUCOSE-CAPILLARY: 104 mg/dL — AB (ref 65–99)
GLUCOSE-CAPILLARY: 207 mg/dL — AB (ref 65–99)
Glucose-Capillary: 101 mg/dL — ABNORMAL HIGH (ref 65–99)
Glucose-Capillary: 104 mg/dL — ABNORMAL HIGH (ref 65–99)
Glucose-Capillary: 127 mg/dL — ABNORMAL HIGH (ref 65–99)
Glucose-Capillary: 169 mg/dL — ABNORMAL HIGH (ref 65–99)
Glucose-Capillary: 180 mg/dL — ABNORMAL HIGH (ref 65–99)
Glucose-Capillary: 197 mg/dL — ABNORMAL HIGH (ref 65–99)
Glucose-Capillary: 199 mg/dL — ABNORMAL HIGH (ref 65–99)
Glucose-Capillary: 303 mg/dL — ABNORMAL HIGH (ref 65–99)
Glucose-Capillary: 417 mg/dL — ABNORMAL HIGH (ref 65–99)
Glucose-Capillary: 85 mg/dL (ref 65–99)

## 2017-06-04 LAB — CBC
HCT: 30 % — ABNORMAL LOW (ref 36.0–46.0)
HEMOGLOBIN: 9.8 g/dL — AB (ref 12.0–15.0)
MCH: 29.4 pg (ref 26.0–34.0)
MCHC: 32.7 g/dL (ref 30.0–36.0)
MCV: 90.1 fL (ref 78.0–100.0)
Platelets: 126 10*3/uL — ABNORMAL LOW (ref 150–400)
RBC: 3.33 MIL/uL — AB (ref 3.87–5.11)
RDW: 17.7 % — ABNORMAL HIGH (ref 11.5–15.5)
WBC: 3.6 10*3/uL — ABNORMAL LOW (ref 4.0–10.5)

## 2017-06-04 LAB — TROPONIN I: Troponin I: <0.03 ng/mL (ref <0.03)

## 2017-06-04 LAB — MRSA PCR SCREENING: MRSA by PCR: NEGATIVE

## 2017-06-04 MED ORDER — AMLODIPINE BESYLATE 10 MG PO TABS
10.0000 mg | ORAL_TABLET | Freq: Every day | ORAL | Status: DC
  Administered 2017-06-04 – 2017-06-06 (×3): 10 mg via ORAL
  Filled 2017-06-04 (×3): qty 1

## 2017-06-04 MED ORDER — INSULIN DETEMIR 100 UNIT/ML ~~LOC~~ SOLN: 7.0000 [IU] | Freq: Every day | SUBCUTANEOUS | Status: DC

## 2017-06-04 MED ORDER — PENTAFLUOROPROP-TETRAFLUOROETH EX AERO
1.0000 "application " | INHALATION_SPRAY | CUTANEOUS | Status: DC | PRN
  Filled 2017-06-04: qty 30

## 2017-06-04 MED ORDER — LIDOCAINE HCL (PF) 1 % IJ SOLN: 5.0000 mL | INTRAMUSCULAR | Status: DC | PRN

## 2017-06-04 MED ORDER — INSULIN ASPART 100 UNIT/ML ~~LOC~~ SOLN
0.0000 [IU] | Freq: Three times a day (TID) | SUBCUTANEOUS | Status: DC
  Administered 2017-06-04: 1 [IU] via SUBCUTANEOUS
  Administered 2017-06-04: 3 [IU] via SUBCUTANEOUS

## 2017-06-04 MED ORDER — SODIUM CHLORIDE 0.9 % IV SOLN: 100.0000 mL | INTRAVENOUS | Status: DC | PRN

## 2017-06-04 MED ORDER — CARVEDILOL 25 MG PO TABS
25.0000 mg | ORAL_TABLET | Freq: Two times a day (BID) | ORAL | Status: DC
  Administered 2017-06-04 – 2017-06-07 (×6): 25 mg via ORAL
  Filled 2017-06-04 (×7): qty 1

## 2017-06-04 MED ORDER — COLLAGENASE 250 UNIT/GM EX OINT
TOPICAL_OINTMENT | Freq: Every day | CUTANEOUS | Status: DC
  Administered 2017-06-04 – 2017-06-05 (×2): 1 via TOPICAL
  Administered 2017-06-06: 10:00:00 via TOPICAL
  Filled 2017-06-04: qty 30

## 2017-06-04 MED ORDER — HEPARIN SODIUM (PORCINE) 1000 UNIT/ML DIALYSIS: 1000.0000 [IU] | INTRAMUSCULAR | Status: DC | PRN

## 2017-06-04 MED ORDER — INSULIN ASPART 100 UNIT/ML ~~LOC~~ SOLN
0.0000 [IU] | Freq: Every day | SUBCUTANEOUS | Status: DC
  Administered 2017-06-04: 5 [IU] via SUBCUTANEOUS
  Administered 2017-06-05: 4 [IU] via SUBCUTANEOUS
  Administered 2017-06-06: 3 [IU] via SUBCUTANEOUS

## 2017-06-04 MED ORDER — ATORVASTATIN CALCIUM 40 MG PO TABS
40.0000 mg | ORAL_TABLET | Freq: Every day | ORAL | Status: DC
  Administered 2017-06-04 – 2017-06-06 (×3): 40 mg via ORAL
  Filled 2017-06-04 (×3): qty 1

## 2017-06-04 MED ORDER — CLONIDINE HCL 0.1 MG PO TABS
0.1000 mg | ORAL_TABLET | Freq: Three times a day (TID) | ORAL | Status: DC
  Administered 2017-06-04 – 2017-06-07 (×9): 0.1 mg via ORAL
  Filled 2017-06-04 (×12): qty 1

## 2017-06-04 MED ORDER — LIDOCAINE-PRILOCAINE 2.5-2.5 % EX CREA
TOPICAL_CREAM | Freq: Once | CUTANEOUS | Status: DC
  Filled 2017-06-04: qty 5

## 2017-06-04 MED ORDER — HYDROCORTISONE 2.5 % RE CREA
TOPICAL_CREAM | Freq: Two times a day (BID) | RECTAL | Status: DC
Start: 2017-06-04 — End: 2017-06-07
  Administered 2017-06-04 – 2017-06-06 (×5): via RECTAL
  Filled 2017-06-04: qty 28.35

## 2017-06-04 MED ORDER — PENTAFLUOROPROP-TETRAFLUOROETH EX AERO: 1.0000 "application " | INHALATION_SPRAY | CUTANEOUS | Status: DC | PRN

## 2017-06-04 MED ORDER — MUPIROCIN 2 % EX OINT
1.0000 | TOPICAL_OINTMENT | Freq: Every day | CUTANEOUS | Status: DC
Start: 2017-06-04 — End: 2017-06-07
  Administered 2017-06-04 – 2017-06-06 (×3): 1 via TOPICAL
  Filled 2017-06-04 (×2): qty 22

## 2017-06-04 MED ORDER — ALTEPLASE 2 MG IJ SOLR: 2.0000 mg | Freq: Once | INTRAMUSCULAR | Status: DC | PRN

## 2017-06-04 MED ORDER — LIDOCAINE-PRILOCAINE 2.5-2.5 % EX CREA: 1.0000 "application " | TOPICAL_CREAM | CUTANEOUS | Status: DC | PRN

## 2017-06-04 MED ORDER — ACETAMINOPHEN-CODEINE #3 300-30 MG PO TABS
1.0000 | ORAL_TABLET | Freq: Once | ORAL | Status: AC
  Administered 2017-06-04: 1 via ORAL
  Filled 2017-06-04: qty 1

## 2017-06-04 MED ORDER — FUROSEMIDE 40 MG PO TABS
40.0000 mg | ORAL_TABLET | Freq: Every day | ORAL | Status: DC
  Administered 2017-06-04 – 2017-06-07 (×4): 40 mg via ORAL
  Filled 2017-06-04 (×4): qty 1

## 2017-06-04 MED ORDER — LIDOCAINE-PRILOCAINE 2.5-2.5 % EX CREA
1.0000 "application " | TOPICAL_CREAM | CUTANEOUS | Status: DC | PRN
  Administered 2017-06-05: 1 via TOPICAL
  Filled 2017-06-04: qty 5

## 2017-06-04 MED ORDER — INSULIN DETEMIR 100 UNIT/ML ~~LOC~~ SOLN
7.0000 [IU] | Freq: Every day | SUBCUTANEOUS | Status: DC
  Administered 2017-06-04: 7 [IU] via SUBCUTANEOUS
  Filled 2017-06-04: qty 0.07

## 2017-06-04 NOTE — Plan of Care (Signed)
Problem: Safety: Goal: Ability to remain free from injury will improve Outcome: Progressing Pt educated about safety; will need PT to work with her to inc activity tolerance  Problem: Activity: Goal: Risk for activity intolerance will decrease Outcome: Progressing Pt will need to work with PT   Problem: Fluid Volume: Goal: Ability to maintain a balanced intake and output will improve Outcome: Progressing Pt off insulin drip and eager to eat full meals

## 2017-06-04 NOTE — Progress Notes (Signed)
[facility] Progress Note  Subjective: breathing somewhat better after HD last night. Had 3L off last night on HD w/o drop in BP's which are still high.    Vitals:   06/03/17 2032 06/03/17 2204 06/04/17 0310 06/04/17 0828  BP: (!) 184/91 (!) 188/84 (!) 160/79 (!) 166/62  Pulse: 80 80 81   Resp: (!) 22 18 (!) 25   Temp: 97.7 F (36.5 C) (!) 97.5 F (36.4 C) 98.2 F (36.8 C)   TempSrc: Oral Oral Oral   SpO2: 100% 99% 99%   Weight: 69.7 kg (153 lb 10.6 oz)     Height:        Inpatient medications: . amLODipine  10 mg Oral QHS  . atorvastatin  40 mg Oral QHS  . carvedilol  25 mg Oral BID WC  . cloNIDine  0.1 mg Oral Q8H  . collagenase   Topical Daily  . furosemide  40 mg Oral Daily  . heparin  5,000 Units Subcutaneous Q8H  . hydrocortisone   Rectal BID  . insulin aspart  0-5 Units Subcutaneous QHS  . insulin aspart  0-9 Units Subcutaneous TID WC  . insulin detemir  7 Units Subcutaneous QHS  . mupirocin ointment  1 application Topical Daily   . sodium chloride    . dextrose 5 % and 0.45% NaCl 100 mL/hr at 06/03/17 2252  . insulin (NOVOLIN-R) infusion Stopped (06/04/17 9211)   acetaminophen **OR** acetaminophen, albuterol, hydrocortisone cream  Exam: General: WDWN female, looks better Neck: Supple. No JVD No masses Lungs: mostly clear, faint basilar rales Heart: RRR with S1 S2 Abdomen: BS+ soft, ntnd no ascites Ext: no edema,  L foot s/t TMA, superficial plantar blister  Neuro: A & O  X 3. Moves all extremities spontaneously. Dialysis Access: LUE AVG +bruit    Dialysis Orders:  [facility] 3.5h   63kg     2K/2.25Ca  LUE AVG    No Heparin Hectorol 27mcg IV q HD Mircera 271mcg IV q 2 weeks (last dosed 10/23) BMM: Ca acetate 2 tabs q ac, Renvela 3 q ac  Outpatient Labs 10/25: Hgb 9.1 Phos 4.8 Ca 9.4 PTH 243  Assessment: 1. Hyperkalemia - K+7.3 on admission -secondary to missed dialysis. Resolved w HD x 1 2. Hyperglycemia/DM - glu 646 >> 100- 150 now  sp IV insulin gtt  3. Leg pain/weakness/malaise - prob due to severe Raliegh Ip and/or ^^BS 4. ESRD - usual HD is TTS.  Still up 4-5 kg today.  5. Hypertension/volume  - BP's still up , vol still up 6. Anemia of CKD - Hgb 9's, Follow trend, on ESA as OP 7. Metabolic bone disease -  Ca ok. Cont VDRA/binders 8. Nutrition - Renal diet/vitamins   Plan - extra short HD today, decrease volume   Kelly Splinter MD [place] pager [phone]   06/04/2017, 10:29 AM    Recent Labs Lab 06/03/17 2227 06/04/17 0200 06/04/17 0433  NA 132* 129* 131*  K 3.8 3.8 4.0  CL 93* 91* 93*  CO2 27 27 24   GLUCOSE 129* 228* 172*  BUN 14 17 17   CREATININE 3.14* 3.35* 3.46*  CALCIUM 8.6* 8.2* 8.4*    Recent Labs Lab 06/03/17 1213  AST 29  ALT 27  ALKPHOS 121  BILITOT 0.9  PROT 7.1  ALBUMIN 3.0*    Recent Labs Lab 06/03/17 1213 06/04/17 0433  WBC 3.0* 3.6*  NEUTROABS 2.2  --   HGB 9.7* 9.8*  HCT 29.3* 30.0*  MCV 89.3 90.1  PLT 129* 126*   Iron/TIBC/Ferritin/ %Sat    Component Value Date/Time   IRON 36 01/16/2017 1113   TIBC 206 (L) 01/16/2017 1113   FERRITIN 776 (H) 01/16/2017 1113   IRONPCTSAT 17 01/16/2017 1113

## 2017-06-04 NOTE — Progress Notes (Signed)
   Subjective: Doing well this AM. Symptoms have greatly improved since HD yesterday. Unsure if she will go for HD today. Unsure if she still feels dizzy or has gait instability as it primarily occurs when she stands up or walks. Able to stand without difficulty this AM. Discussed the results of her CT head and the need for an MRI of the head. Will transition to subQ insulin. All questions and concerns addressed.   Objective: Vital signs in last 24 hours: Vitals:   06/03/17 1945 06/03/17 2032 06/03/17 2204 06/04/17 0310  BP: (!) 183/94 (!) 184/91 (!) 188/84 (!) 160/79  Pulse: 77 80 80 81  Resp: (!) 24 (!) 22 18 (!) 25  Temp:  97.7 F (36.5 C) (!) 97.5 F (36.4 C) 98.2 F (36.8 C)  TempSrc:  Oral Oral Oral  SpO2:  100% 99% 99%  Weight:  153 lb 10.6 oz (69.7 kg)    Height:       General: Thin female resting comfortably  Pulm: Good air movement no wheezing or crackles CV: RRR, normal S1 and S2 Abdomen: Active bowel sounds, no tenderness to palpation  Extremities: No LE edema  Neuro: No nystagmus on exam   Assessment/Plan:  1. Acute Vestibular Syndrome - DDx: Posterior CVA, Vestibular Migraine, Vestibular Neuronitis, Uremia, Hypoperfusion, Hyperglycemia  - Symptoms improved with HD but unable to speak to her gait instability  - CT Head showing severely age advanced atrophy but no acute abnormalities. Will follow-up with an MRI of the head. - Telemetry shows the patient in sinus overnight   2. Volume overload  - Presented with signs and symptoms of volume overload.  - Emergent HD yesterday evening (3L removed) with significant improvement in symptoms   3. ESRD on HD TTS - Emergent HD yesterday evening due to volume overload and hyperkalemia  - Pertinent labs: K 4.0, CO2 24, BUN 17, AG 14, Ca 8.6, Hgb 9.6 - Nephrology following   4. Combined systolic and diastolic dysfunction  - Recent echo with EF 45-50% with G2DD and diffuse hypokineses - Will continue home regimen of  Atorvastatin, Coreg, and Lasix   5. Type 2 DM - Presented with hyperglycemia after not taking her insulin for several days. Initially had an anion gap but no ketones in her serum. AG is likely due to missed HD.  - Currently on the insulin drip. Will transition to Levemir 7 units for basal and SSI with meals.  6. Hypertension  - Restart home medications  Dispo: Anticipated discharge in approximately 0-1 day(s).   Ina Homes, MD 06/04/2017, 5:12 AM My Pager: [phone]

## 2017-06-04 NOTE — Progress Notes (Signed)
Subjective: Patient reports improvement of symptoms post dialysis except for persistent headache. Denies any more nausea, vomiting, or dizziness. Patient was able to maintain balance while standing this morning. Patient wanted  Refill of several topical creams for her foot refilled. All questions and concerns were addressed.  Objective: Vital signs in last 24 hours: Vitals:   06/03/17 2032 06/03/17 2204 06/04/17 0310 06/04/17 0828  BP: (!) 184/91 (!) 188/84 (!) 160/79 (!) 166/62  Pulse: 80 80 81   Resp: (!) 22 18 (!) 25   Temp: 97.7 F (36.5 C) (!) 97.5 F (36.4 C) 98.2 F (36.8 C)   TempSrc: Oral Oral Oral   SpO2: 100% 99% 99%   Weight: 69.7 kg (153 lb 10.6 oz)     Height:       Weight change:   Intake/Output Summary (Last 24 hours) at 06/04/17 1049 Last data filed at 06/03/17 1945  Gross per 24 hour  Intake              100 ml  Output             3005 ml  Net            -2905 ml   Physical Exam  Constitutional: She is oriented to person, place, and time. She appears well-developed.  Eyes: Pupils are equal, round, and reactive to light. Conjunctivae and EOM are normal. No scleral icterus.  Neck: No thyromegaly present.  Cardiovascular: Normal rate, regular rhythm, normal heart sounds and intact distal pulses.   No murmur heard. Pulmonary/Chest: Effort normal and breath sounds normal. No respiratory distress. She has no wheezes. She has no rales.  Abdominal: Soft. Bowel sounds are normal. She exhibits no distension. There is no tenderness.  Lymphadenopathy:    She has no cervical adenopathy.  Neurological: She is alert and oriented to person, place, and time. She displays normal reflexes. No cranial nerve deficit or sensory deficit. She exhibits normal muscle tone. Coordination normal.  No nystagmus.  Skin: Skin is warm and dry. She is not diaphoretic.  Multiple scars on lower extremities and back      Studies/Results: Dg Chest 2 View  Result Date:  06/03/2017 CLINICAL DATA:  Chest pain.  Shortness of breath.  Arm and leg pain. EXAM: CHEST  2 VIEW COMPARISON:  02/25/2017 . FINDINGS: Mediastinum and hilar structures are normal . Cardiomegaly with mild pulmonary vascular prominence, interstitial interstitial prominence, and bilateral pleural effusions. Findings suggest CHF. Findings have progressed prior exam inter consistent CHF. Right base infiltrate/pneumonia cannot be excluded . IMPRESSION: Progressive changes of congestive heart failure with pulmonary interstitial edema and bilateral pleural effusions. 2. Right lower lobe infiltrate/pneumonia cannot be excluded. Electronically Signed   By: Marcello Moores  Register   On: 06/03/2017 11:28   Ct Head Wo Contrast  Result Date: 06/04/2017 CLINICAL DATA:  Dizziness and right-sided visual disturbance EXAM: CT HEAD WITHOUT CONTRAST TECHNIQUE: Contiguous axial images were obtained from the base of the skull through the vertex without intravenous contrast. COMPARISON:  Head CT 08/15/2016 FINDINGS: Brain: No mass lesion, intraparenchymal hemorrhage or extra-axial collection. No evidence of acute cortical infarct. Diffuse atrophy with enlarged sylvian fissures is unchanged. Prominent left lentiform nucleus perivascular space is unchanged. There is periventricular hypoattenuation compatible with chronic microvascular disease. Vascular: No hyperdense vessel or unexpected calcification. Skull: Normal visualized skull base, calvarium and extracranial soft tissues. Sinuses/Orbits: No sinus fluid levels or advanced mucosal thickening. No mastoid effusion. Normal orbits. IMPRESSION: Severely age advanced atrophy, unchanged, without acute  abnormality. Electronically Signed   By: Ulyses Jarred M.D.   On: 06/04/2017 00:37   Mr Brain Wo Contrast  Result Date: 06/04/2017 CLINICAL DATA:  51 year old female with dizziness, unsteady gait. Dialysis patient. EXAM: MRI HEAD WITHOUT CONTRAST TECHNIQUE: Multiplanar, multiecho pulse  sequences of the brain and surrounding structures were obtained without intravenous contrast. COMPARISON:  Head CT 06/03/2017.  Brain MRI 11/22/2014 and earlier. FINDINGS: Brain: 2 questionable punctate areas of restricted diffusion in the anterior and superior left frontal lobe on series 3, images 38 and 40. No other restricted diffusion. Stable cerebral volume including prominent perisylvian volume loss. No midline shift, mass effect, evidence of mass lesion, ventriculomegaly, extra-axial collection or acute intracranial hemorrhage. Cervicomedullary junction and pituitary are within normal limits. Chronic patchy periatrial white matter T2 and FLAIR hyperintensity on the left with otherwise scattered small nonspecific T2 and FLAIR hyperintense foci in the white matter. No definite cortical encephalomalacia or chronic cerebral blood products. Chronic perivascular space left inferior lentiform. Deep gray matter nuclei, brainstem, and cerebellum remain within normal limits. Vascular: Major intracranial vascular flow voids are stable. Skull and upper cervical spine: Decreased T1 bone marrow signal since 2016. Stable and negative visualized cervical spine otherwise. Sinuses/Orbits: Stable and negative. Other: Visible internal auditory structures appear stable and normal. Mastoid air cells are clear. Scalp and face soft tissues appear negative. IMPRESSION: 1. No explanation for acute dizziness, but there are two questionable punctate areas of acute ischemia in the left frontal lobe (left MCA territory). These may be artifact. 2. No other acute intracranial abnormality and otherwise stable MRI appearance of the brain since 2016. 3. 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 Electronically Signed   By: Genevie Ann M.D.   On: 06/04/2017 10:09   Medications: I have reviewed the patient's current medications. Scheduled Meds: . amLODipine  10 mg Oral QHS  .  atorvastatin  40 mg Oral QHS  . carvedilol  25 mg Oral BID WC  . cloNIDine  0.1 mg Oral Q8H  . collagenase   Topical Daily  . furosemide  40 mg Oral Daily  . heparin  5,000 Units Subcutaneous Q8H  . hydrocortisone   Rectal BID  . insulin aspart  0-5 Units Subcutaneous QHS  . insulin aspart  0-9 Units Subcutaneous TID WC  . insulin detemir  7 Units Subcutaneous QHS  . lidocaine-prilocaine   Topical Once  . mupirocin ointment  1 application Topical Daily   Continuous Infusions: . sodium chloride    . dextrose 5 % and 0.45% NaCl 100 mL/hr at 06/03/17 2252  . insulin (NOVOLIN-R) infusion Stopped (06/04/17 0625)   PRN Meds:.acetaminophen **OR** acetaminophen, albuterol, hydrocortisone cream Assessment/Plan: Active Problems:   Dizziness   Hyperkalemia   ESRD (end stage renal disease) (HCC)   Volume overload   Hyperglycemia  Patient presented with 4 days of dizziness, gait instability, nausea and vomiting without signs of hearing loss or tinnitus consistent with acute vestibular syndrome. CT head and MRI were both unremarkable making central vertigo an unlikely etiology. Patient presented with a BUN of 50 after missing HD, making uremia a possible cause of symptoms. Will re-evaluate post HD this afternoon  Acute Vestibular Syndrome -Hemodialysis  -Head thrust test -Corticosteroid therapy if symptoms persist  ESRD -Hemodialysis -Nephrology following  Uncontrolled Diabetes type II -Levemir 7units basal and sliding scale insulin w/ meals  This is a Careers information officer Note.  The care of the patient was discussed with  Dr. Tarri Abernethy and the assessment and plan formulated with their assistance.  Please see their attached note for official documentation of the daily encounter.   LOS: 1 day   Ronnette Juniper, Medical Student 06/04/2017, 10:49 AM  My pager: [phone]

## 2017-06-05 LAB — CBC
HEMATOCRIT: 28.2 % — AB (ref 36.0–46.0)
Hemoglobin: 9 g/dL — ABNORMAL LOW (ref 12.0–15.0)
MCH: 29.5 pg (ref 26.0–34.0)
MCHC: 31.9 g/dL (ref 30.0–36.0)
MCV: 92.5 fL (ref 78.0–100.0)
Platelets: 123 10*3/uL — ABNORMAL LOW (ref 150–400)
RBC: 3.05 MIL/uL — ABNORMAL LOW (ref 3.87–5.11)
RDW: 18.3 % — AB (ref 11.5–15.5)
WBC: 2.9 10*3/uL — ABNORMAL LOW (ref 4.0–10.5)

## 2017-06-05 LAB — RENAL FUNCTION PANEL
ALBUMIN: 2.9 g/dL — AB (ref 3.5–5.0)
Anion gap: 12 (ref 5–15)
BUN: 17 mg/dL (ref 6–20)
CHLORIDE: 95 mmol/L — AB (ref 101–111)
CO2: 25 mmol/L (ref 22–32)
Calcium: 8 mg/dL — ABNORMAL LOW (ref 8.9–10.3)
Creatinine, Ser: 3.41 mg/dL — ABNORMAL HIGH (ref 0.44–1.00)
GFR calc Af Amer: 17 mL/min — ABNORMAL LOW (ref >60)
GFR calc non Af Amer: 15 mL/min — ABNORMAL LOW (ref >60)
GLUCOSE: 507 mg/dL — AB (ref 65–99)
POTASSIUM: 4.2 mmol/L (ref 3.5–5.1)
Phosphorus: 4.1 mg/dL (ref 2.5–4.6)
Sodium: 132 mmol/L — ABNORMAL LOW (ref 135–145)

## 2017-06-05 LAB — GLUCOSE, CAPILLARY
GLUCOSE-CAPILLARY: 341 mg/dL — AB (ref 65–99)
GLUCOSE-CAPILLARY: 423 mg/dL — AB (ref 65–99)
Glucose-Capillary: 335 mg/dL — ABNORMAL HIGH (ref 65–99)
Glucose-Capillary: 215 mg/dL — ABNORMAL HIGH (ref 65–99)
Glucose-Capillary: 294 mg/dL — ABNORMAL HIGH (ref 65–99)

## 2017-06-05 MED ORDER — INSULIN ASPART 100 UNIT/ML ~~LOC~~ SOLN
0.0000 [IU] | Freq: Three times a day (TID) | SUBCUTANEOUS | Status: DC
  Administered 2017-06-05: 11 [IU] via SUBCUTANEOUS
  Administered 2017-06-05: 5 [IU] via SUBCUTANEOUS
  Administered 2017-06-05: 8 [IU] via SUBCUTANEOUS
  Administered 2017-06-06: 15 [IU] via SUBCUTANEOUS

## 2017-06-05 MED ORDER — INSULIN DETEMIR 100 UNIT/ML ~~LOC~~ SOLN: 10.0000 [IU] | Freq: Every day | SUBCUTANEOUS | Status: DC

## 2017-06-05 MED ORDER — INSULIN DETEMIR 100 UNIT/ML ~~LOC~~ SOLN
10.0000 [IU] | Freq: Every day | SUBCUTANEOUS | Status: DC
  Administered 2017-06-06: 10 [IU] via SUBCUTANEOUS
  Filled 2017-06-05 (×3): qty 0.1

## 2017-06-05 NOTE — Progress Notes (Signed)
Pt has not yet been given long acting insulin. Pt was off unit for dialysis when scheduled. When pt back dose sent was only 5units (10units ordered). Request placed to pharmacy x2. Covered with short acting insulin. Still needs long acting (2nd request to pharm notified of transfer to [place]).   [place]  Ariea Rochin, RN

## 2017-06-05 NOTE — Progress Notes (Addendum)
[facility] Progress Note  Subjective: feels much better, SOB better   Vitals:   06/05/17 0700 06/05/17 0815 06/05/17 0830 06/05/17 0835  BP:  (!) 159/77 (!) 159/77 (!) 149/78  Pulse:  85 86 85  Resp:  18 14 16   Temp:  (!) 96.9 F (36.1 C)    TempSrc:  Oral    SpO2:  100% 100% 100%  Weight: 67.1 kg (147 lb 14.9 oz)     Height:        Inpatient medications: . amLODipine  10 mg Oral QHS  . atorvastatin  40 mg Oral QHS  . carvedilol  25 mg Oral BID WC  . cloNIDine  0.1 mg Oral Q8H  . collagenase   Topical Daily  . furosemide  40 mg Oral Daily  . heparin  5,000 Units Subcutaneous Q8H  . hydrocortisone   Rectal BID  . insulin aspart  0-15 Units Subcutaneous TID WC  . insulin aspart  0-5 Units Subcutaneous QHS  . insulin detemir  10 Units Subcutaneous Q breakfast  . lidocaine-prilocaine   Topical Once  . mupirocin ointment  1 application Topical Daily   . sodium chloride    . sodium chloride    . sodium chloride    . sodium chloride     sodium chloride, sodium chloride, sodium chloride, sodium chloride, acetaminophen **OR** acetaminophen, albuterol, alteplase, heparin, hydrocortisone cream, lidocaine (PF), lidocaine-prilocaine, pentafluoroprop-tetrafluoroeth  Exam: General: WDWN female, looks better Neck: Supple. No JVD No masses Lungs: mostly clear, faint basilar rales Heart: RRR with S1 S2 Abdomen: BS+ soft, ntnd no ascites Ext: no edema,  L foot s/t TMA, superficial plantar blister  Neuro: A & O  X 3. Moves all extremities spontaneously. Dialysis Access: LUE AVG +bruit    Dialysis Orders:  [facility] 3.5h   63kg     2K/2.25Ca  LUE AVG    No Heparin Hectorol 70mcg IV q HD Mircera 28mcg IV q 2 weeks (last dosed 10/23) BMM: Ca acetate 2 tabs q ac, Renvela 3 q ac  Outpatient Labs 10/25: Hgb 9.1 Phos 4.8 Ca 9.4 PTH 243  Assessment: 1. Hyperkalemia - due to missed HD, resolved 2. Hyperglycemia/DM - resolved 3. Leg pain/weakness/malaise - due to  [place], resolved 4. ESRD - TTS HD 5. HTN/ volume- should be down to dry wt after HD this am.  6. Anemia of CKD - Hgb 9's, Follow trend, on ESA as OP 7. Metabolic bone disease -  Ca ok. Cont VDRA/binders 8. Nutrition - Renal diet/vitamins   Plan - ready for dc after HD today   Kelly Splinter MD [facility] pager [phone]   06/05/2017, 10:05 AM    Recent Labs Lab 06/04/17 0433 06/04/17 1008 06/05/17 0902  NA 131* 131* 132*  K 4.0 4.0 4.2  CL 93* 92* 95*  CO2 24 29 25   GLUCOSE 172* 92 PENDING  BUN 17 18 17   CREATININE 3.46* 3.68* 3.41*  CALCIUM 8.4* 8.2* 8.0*  PHOS  --   --  4.1    Recent Labs Lab 06/03/17 1213 06/05/17 0902  AST 29  --   ALT 27  --   ALKPHOS 121  --   BILITOT 0.9  --   PROT 7.1  --   ALBUMIN 3.0* 2.9*    Recent Labs Lab 06/03/17 1213 06/04/17 0433 06/05/17 0902  WBC 3.0* 3.6* 2.9*  NEUTROABS 2.2  --   --   HGB 9.7* 9.8* 9.0*  HCT 29.3* 30.0* 28.2*  MCV 89.3 90.1 92.5  PLT 129* 126* 123*   Iron/TIBC/Ferritin/ %Sat    Component Value Date/Time   IRON 36 01/16/2017 1113   TIBC 206 (L) 01/16/2017 1113   FERRITIN 776 (H) 01/16/2017 1113   IRONPCTSAT 17 01/16/2017 1113

## 2017-06-05 NOTE — Progress Notes (Signed)
Inpatient Diabetes Program Recommendations  AACE/ADA: New Consensus Statement on Inpatient Glycemic Control (2015)  Target Ranges:  Prepandial:   less than 140 mg/dL      Peak postprandial:   less than 180 mg/dL (1-2 hours)      Critically ill patients:  140 - 180 mg/dL   Results for [name], [name] (MRN [id number]) as of 06/05/2017 07:34  Ref. Range 06/04/2017 11:00 06/04/2017 11:30 06/04/2017 14:05 06/04/2017 18:07 06/04/2017 23:40  Glucose-Capillary Latest Ref Range: 65 - 99 mg/dL 94 104 (H) 104 (H) 207 (H) 417 (H)    Home DM Meds: Lantus 13 units QHS       Novolog 5 units TID  Current Insulin Orders: Levemir 10 units daily      Novolog Moderate Correction Scale/ SSI (0-15 units) TID AC + HS      MD- Note patient transitioned off the IV Insulin drip yesterday AM.  Was given 10 units Levemir around 11am yesterday.  Note that CBG climbed to 417 mg/dl at bedtime last night.  Levemir was increased to 10 units daily this AM.  MD- Please consider the following:  1. May want to switch basal insulin to Lantus since patient takes Lantus at home (current orders are for Levemir)  2. Consider starting low dose Novolog Meal Coverage as well- Novolog 3 units TID with meals (patient takes Novolog 5 units TID at home)      --Will follow patient during hospitalization--  Wyn Quaker RN, MSN, CDE Diabetes Coordinator Inpatient Glycemic Control Team Team Pager: [phone] (8a-5p)

## 2017-06-05 NOTE — Evaluation (Signed)
Physical Therapy Evaluation Patient Details Name: [name] MRN: [id number] DOB: 1966-01-05 Today's Date: 06/05/2017   History of Present Illness  [name] is a 51 year old female with a past medical history significant for ESRD on HD TTS, HFrEF, DM type 2, and prior CVA that presented 2 days ago with acute vestibular syndrome and signs of volume overload. 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   Clinical Impression  Pt admitted with above diagnosis. Pt currently with functional limitations due to the deficits listed below (see PT Problem List). Assessed vestibular system with pt with bil BPPV.  PErformed Epley bilaterally.  Pt reports decr dizziness after treatment. Will continue PT.  Feel that she will benefit from continued therapy at SNF as PT suspects pt has labyrynthitis/neuritis as well.   Pt will benefit from skilled PT to increase their independence and safety with mobility to allow discharge to the venue listed below.      Follow Up Recommendations SNF;Supervision/Assistance - 24 hour (vestibular rehab)    Equipment Recommendations  Other (comment) (TBA)    Recommendations for Other Services       Precautions / Restrictions Precautions Precautions: Fall Restrictions Weight Bearing Restrictions: No      Mobility  Bed Mobility Overal bed mobility: Independent             General bed mobility comments: Assessed for vertigo with pt with positive bil BPPV.  Treated with Epley.  Pt reports less dizziness after treatments.  Wanted to eat her breakfast therefore allowed pt to do so.  Can assess further next treatment as suspect pt may have labyrinthitis as well.   Transfers Overall transfer level: Needs assistance   Transfers: Sit to/from Stand Sit to Stand: Min guard         General transfer comment: steadying assist needed  Ambulation/Gait                Stairs            Wheelchair Mobility    Modified Rankin (Stroke Patients Only)       Balance Overall balance assessment: Needs assistance Sitting-balance support: No upper extremity supported;Feet supported Sitting balance-Leahy Scale: Good     Standing balance support: Bilateral upper extremity supported;During functional activity Standing balance-Leahy Scale: Poor Standing balance comment: needs UE suport for stability.                             Pertinent Vitals/Pain Pain Assessment: No/denies pain    Home Living Family/patient expects to be discharged to:: Private residence Living Arrangements: Spouse/significant other Available Help at Discharge: Family;Available PRN/intermittently Type of Home: House Home Access: Stairs to enter Entrance Stairs-Rails: Can reach both Entrance Stairs-Number of Steps: 3 Home Layout: One level Home Equipment: Walker - 2 wheels;Bedside commode      Prior Function Level of Independence: Independent with assistive device(s)               Hand Dominance   Dominant Hand: Right    Extremity/Trunk Assessment   Upper Extremity Assessment Upper Extremity Assessment: Defer to OT evaluation    Lower Extremity Assessment Lower Extremity Assessment: Generalized weakness    Cervical / Trunk Assessment Cervical / Trunk Assessment: Normal  Communication   Communication: No difficulties  Cognition Arousal/Alertness: Awake/alert Behavior During Therapy: Flat affect Overall Cognitive Status: Within Functional Limits for tasks assessed  General Comments      Exercises     Assessment/Plan    PT Assessment Patient needs continued PT services  PT Problem List Decreased activity tolerance;Decreased balance;Decreased mobility;Decreased knowledge of use of DME;Decreased safety awareness;Decreased knowledge of precautions (dizziness)       PT Treatment Interventions DME instruction;Gait  training;Functional mobility training;Therapeutic activities;Therapeutic exercise;Balance training;Patient/family education (vestibular rehab, Epley maneuver, gaze stability)    PT Goals (Current goals can be found in the Care Plan section)  Acute Rehab PT Goals Patient Stated Goal: to go to REhab and get stronger PT Goal Formulation: With patient Time For Goal Achievement: 06/19/17 Potential to Achieve Goals: Good    Frequency Min 3X/week   Barriers to discharge Decreased caregiver support      Co-evaluation               AM-PAC PT "6 Clicks" Daily Activity  Outcome Measure Difficulty turning over in bed (including adjusting bedclothes, sheets and blankets)?: None Difficulty moving from lying on back to sitting on the side of the bed? : None Difficulty sitting down on and standing up from a chair with arms (e.g., wheelchair, bedside commode, etc,.)?: A Little Help needed moving to and from a bed to chair (including a wheelchair)?: A Little Help needed walking in hospital room?: A Little Help needed climbing 3-5 steps with a railing? : A Little 6 Click Score: 20    End of Session Equipment Utilized During Treatment: Gait belt Activity Tolerance: Patient limited by fatigue (limited by dizziness) Patient left: in bed;with call Buehner/phone within reach (on EOB eating breakfast) Nurse Communication: Mobility status PT Visit Diagnosis: Muscle weakness (generalized) (M62.81);Dizziness and giddiness (R42);BPPV BPPV - Right/Left : Right;Left    Time: 4010-2725 PT Time Calculation (min) (ACUTE ONLY): 15 min   Charges:   PT Evaluation $PT Eval Moderate Complexity: 1 Mod     PT G Codes:        Myranda Pavone,PT Acute Rehabilitation [phone] (pager)   Denice Paradise 06/05/2017, 2:02 PM

## 2017-06-05 NOTE — Progress Notes (Signed)
Late entry for 1015, lab called with critical blood glucose at 507, re cheched at 1030 blood sugar was 335, primary RN administered scheduled insulin. Patient with no complaints

## 2017-06-05 NOTE — Progress Notes (Signed)
PT Cancellation Note  Patient Details Name: [name] MRN: [id number] DOB: Dec 23, 1965   Cancelled Treatment:    Reason Eval/Treat Not Completed: Patient at procedure or test/unavailable (Pt in dialysis.  Will check back.  Thanks. )   Godfrey Pick Madeline Bebout 06/05/2017, 9:44 AM  [facility] [phone] (pager)

## 2017-06-05 NOTE — Progress Notes (Signed)
   Subjective: Doing well this AM. States that her symptoms have resolved completely and she feels like she is ready to go home. She still feels a little weak but thinks that even that has greatly improved. Discussed how important it is to continue with HD and control her blood sugar. She is scheduled for HD today and we will have PT come by the see her to determine if she needs home health or placement, after which she may be able to go home today.  Objective: Vital signs in last 24 hours: Vitals:   06/04/17 2252 06/04/17 2330 06/05/17 0405 06/05/17 0428  BP: (!) 158/74 (!) 170/75  (!) 168/78  Pulse: 81     Resp: 18 18  (!) 22  Temp: 97.7 F (36.5 C) (!) 97.4 F (36.3 C)  98 F (36.7 C)  TempSrc: Oral Oral  Oral  SpO2: 100% 100%  100%  Weight: 146 lb 9.7 oz (66.5 kg)  146 lb 9.7 oz (66.5 kg)   Height:       General: Thin female resting comfortably in bed  Pulm: Good air movement with no wheezing or crackles.  CV: RRR, no murmurs, no rubs  Abdomen: Active bowel sounds, soft, non-distended, no tenderness to palpation  Extremities: Pulses palpable in the UE bilaterally, no LE edema   Assessment/Plan:  1. Gait Instability 2/2 metabolic derangements  - DDx: Vestibular Migraine, Vestibular Neuronitis, Uremia, Hypoperfusion, Hyperglycemia, Hyperkalemia  - CT Head showing severely age advanced atrophy but no acute abnormalities. While MRI illustrated no acute findings to explain the patient's dizziness but there were 2 punctate areas of acute ischemia in the left frontal lobe that may or may not be artifact. While the negative MRI and CT cannot definitively rule out a posterior circulation stroke it does make it less likely.  - Telemetry shows the patient in sinus overnight  - Given that she continues to have improvement of her symptoms with HD it is likely that her symptoms are a result of metabolic derangements including hyperglycemia, hyperkalemia, and being uremic.  - HD today  - PT and  OT consult today   2. Volume overload  - Presented with signs and symptoms of volume overload.  - Significant improvement in her symptoms s/p 2 HD sessions  - Will go to HD today   3. ESRD on HD TTS - Pertinent labs: K 4.0, CO2 29, BUN 18, AG 10, Ca 8.2, Hgb 9.8 - Nephrology following  - HD today   4. Combined systolic and diastolic dysfunction  - Recent echo with EF 45-50% with G2DD and diffuse hypokineses - Will continue home regimen of Atorvastatin, Coreg, and Lasix   5. Type 2 DM - Presented with hyperglycemia after not taking her insulin for several days. Initially had an anion gap but no ketones in her serum. AG is likely due to missed HD.  - Off the insulin drip. Sugars are now back in the 400s. Increasing Levemir to 10 units and moderate SSI  6. Hypertension  - Continue amlodipine and clonidine   Dispo: Anticipated discharge in approximately 0-1 day(s).   Ina Homes, MD 06/05/2017, 6:04 AM My Pager: [phone]

## 2017-06-05 NOTE — Progress Notes (Signed)
Subjective: Patient reports feeling much improved after dialysis yesterday. Endorses resolution of all presenting symptoms. Denies any nausea, vomiting, dizziness, or gait instability. Patient brought up wanting to go to a nursing facility this morning due to ambulatory difficulties with her transmetatarsal amputation and deformed right arm. Scheduled to receive dialysis today and likely going home.  Objective: Vital signs in last 24 hours: Vitals:   06/05/17 0835 06/05/17 0900 06/05/17 0930 06/05/17 1000  BP: (!) 149/78 (!) 161/78 (!) 156/75 (!) 145/69  Pulse: 85 71 88 86  Resp: 16 16 17 18   Temp:      TempSrc:      SpO2: 100%  100% 100%  Weight:      Height:       Weight change: 2.914 kg (6 lb 6.8 oz)  Intake/Output Summary (Last 24 hours) at 06/05/17 1045 Last data filed at 06/05/17 0600  Gross per 24 hour  Intake            521.3 ml  Output                0 ml  Net            521.3 ml   Physical Exam General: well appearing, sitting comfortably on side of bed HENT: PERRL, no nystagmus Chest: RRR, no m/r/g Lungs: CTAB, normal work of breathing Abdomen: soft, non tender, non-distended, normal bowel sounds Nero: CN II-XII grossly intact. No focal deficits.    Micro Results: Recent Results (from the past 240 hour(s))  MRSA PCR Screening     Status: None   Collection Time: 06/03/17 10:03 PM  Result Value Ref Range Status   MRSA by PCR NEGATIVE NEGATIVE Final    Comment:        The GeneXpert MRSA Assay (FDA approved for NASAL specimens only), is one component of a comprehensive MRSA colonization surveillance program. It is not intended to diagnose MRSA infection nor to guide or monitor treatment for MRSA infections.    Studies/Results: Dg Chest 2 View  Result Date: 06/03/2017 CLINICAL DATA:  Chest pain.  Shortness of breath.  Arm and leg pain. EXAM: CHEST  2 VIEW COMPARISON:  02/25/2017 . FINDINGS: Mediastinum and hilar structures are normal . Cardiomegaly with  mild pulmonary vascular prominence, interstitial interstitial prominence, and bilateral pleural effusions. Findings suggest CHF. Findings have progressed prior exam inter consistent CHF. Right base infiltrate/pneumonia cannot be excluded . IMPRESSION: Progressive changes of congestive heart failure with pulmonary interstitial edema and bilateral pleural effusions. 2. Right lower lobe infiltrate/pneumonia cannot be excluded. Electronically Signed   By: Marcello Moores  Register   On: 06/03/2017 11:28   Ct Head Wo Contrast  Result Date: 06/04/2017 CLINICAL DATA:  Dizziness and right-sided visual disturbance EXAM: CT HEAD WITHOUT CONTRAST TECHNIQUE: Contiguous axial images were obtained from the base of the skull through the vertex without intravenous contrast. COMPARISON:  Head CT 08/15/2016 FINDINGS: Brain: No mass lesion, intraparenchymal hemorrhage or extra-axial collection. No evidence of acute cortical infarct. Diffuse atrophy with enlarged sylvian fissures is unchanged. Prominent left lentiform nucleus perivascular space is unchanged. There is periventricular hypoattenuation compatible with chronic microvascular disease. Vascular: No hyperdense vessel or unexpected calcification. Skull: Normal visualized skull base, calvarium and extracranial soft tissues. Sinuses/Orbits: No sinus fluid levels or advanced mucosal thickening. No mastoid effusion. Normal orbits. IMPRESSION: Severely age advanced atrophy, unchanged, without acute abnormality. Electronically Signed   By: Ulyses Jarred M.D.   On: 06/04/2017 00:37   Mr Brain Wo Contrast  Result  Date: 06/04/2017 CLINICAL DATA:  51 year old female with dizziness, unsteady gait. Dialysis patient. EXAM: MRI HEAD WITHOUT CONTRAST TECHNIQUE: Multiplanar, multiecho pulse sequences of the brain and surrounding structures were obtained without intravenous contrast. COMPARISON:  Head CT 06/03/2017.  Brain MRI 11/22/2014 and earlier. FINDINGS: Brain: 2 questionable punctate  areas of restricted diffusion in the anterior and superior left frontal lobe on series 3, images 38 and 40. No other restricted diffusion. Stable cerebral volume including prominent perisylvian volume loss. No midline shift, mass effect, evidence of mass lesion, ventriculomegaly, extra-axial collection or acute intracranial hemorrhage. Cervicomedullary junction and pituitary are within normal limits. Chronic patchy periatrial white matter T2 and FLAIR hyperintensity on the left with otherwise scattered small nonspecific T2 and FLAIR hyperintense foci in the white matter. No definite cortical encephalomalacia or chronic cerebral blood products. Chronic perivascular space left inferior lentiform. Deep gray matter nuclei, brainstem, and cerebellum remain within normal limits. Vascular: Major intracranial vascular flow voids are stable. Skull and upper cervical spine: Decreased T1 bone marrow signal since 2016. Stable and negative visualized cervical spine otherwise. Sinuses/Orbits: Stable and negative. Other: Visible internal auditory structures appear stable and normal. Mastoid air cells are clear. Scalp and face soft tissues appear negative. IMPRESSION: 1. No explanation for acute dizziness, but there are two questionable punctate areas of acute ischemia in the left frontal lobe (left MCA territory). These may be artifact. 2. No other acute intracranial abnormality and otherwise stable MRI appearance of the brain since 2016. 3. 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 Electronically Signed   By: Genevie Ann M.D.   On: 06/04/2017 10:09   Medications: I have reviewed the patient's current medications. Scheduled Meds: . amLODipine  10 mg Oral QHS  . atorvastatin  40 mg Oral QHS  . carvedilol  25 mg Oral BID WC  . cloNIDine  0.1 mg Oral Q8H  . collagenase   Topical Daily  . furosemide  40 mg Oral Daily  . heparin  5,000 Units Subcutaneous Q8H  .  hydrocortisone   Rectal BID  . insulin aspart  0-15 Units Subcutaneous TID WC  . insulin aspart  0-5 Units Subcutaneous QHS  . insulin detemir  10 Units Subcutaneous Q breakfast  . lidocaine-prilocaine   Topical Once  . mupirocin ointment  1 application Topical Daily   Continuous Infusions: . sodium chloride    . sodium chloride    . sodium chloride    . sodium chloride     PRN Meds:.sodium chloride, sodium chloride, sodium chloride, sodium chloride, acetaminophen **OR** acetaminophen, albuterol, alteplase, heparin, hydrocortisone cream, lidocaine (PF), lidocaine-prilocaine, pentafluoroprop-tetrafluoroeth Assessment/Plan: Active Problems:   Dizziness   Hyperkalemia   ESRD (end stage renal disease) (HCC)   Volume overload   Hyperglycemia  Ms Forrer is a 51 year old female with a past medical history significant for ESRD on HD TTS, HFrEF, DM type 2, and prior CVA that presented 2 days ago with acute vestibular syndrome and signs of volume overload. CT and MRI were both unremarkable for any acute change likely ruling out any central causes of vertigo. Patient is s/p 2 sessions of HD and all presenting symptoms have since resolved indicating etiology of symptoms was likely metabolic derangments given patient presented with a hyperkalemia of 7.3, hyperglycemia of >600, anion gap metabolic acidosis and a BUN of 50.  ESRD -Hemodialysis is scheduled for today -Blood pressure remains elevated will reassess following HD session  DM type 2 - Levemir  10 units this morning - discontinue insulin drip and start sliding scale insulin  Dyspo - patient likely discharged today pending PT signoff This is a Careers information officer Note.  The care of the patient was discussed with Dr. Philipp Ovens and the assessment and plan formulated with their assistance.  Please see their attached note for official documentation of the daily encounter.   LOS: 2 days   Ronnette Juniper, Medical Student 06/05/2017, 10:45  AM  My pager: [phone]

## 2017-06-06 DIAGNOSIS — I120 Hypertensive chronic kidney disease with stage 5 chronic kidney disease or end stage renal disease: Secondary | ICD-10-CM

## 2017-06-06 LAB — GLUCOSE, CAPILLARY
GLUCOSE-CAPILLARY: 120 mg/dL — AB (ref 65–99)
GLUCOSE-CAPILLARY: 258 mg/dL — AB (ref 65–99)
Glucose-Capillary: 485 mg/dL — ABNORMAL HIGH (ref 65–99)
Glucose-Capillary: 536 mg/dL (ref 65–99)
Glucose-Capillary: 599 mg/dL (ref 65–99)
Glucose-Capillary: 500 mg/dL — ABNORMAL HIGH (ref 65–99)
Glucose-Capillary: 330 mg/dL — ABNORMAL HIGH (ref 65–99)

## 2017-06-06 MED ORDER — INSULIN ASPART 100 UNIT/ML ~~LOC~~ SOLN
7.0000 [IU] | Freq: Three times a day (TID) | SUBCUTANEOUS | Status: DC
  Administered 2017-06-07: 7 [IU] via SUBCUTANEOUS

## 2017-06-06 MED ORDER — POLYETHYLENE GLYCOL 3350 17 G PO PACK
17.0000 g | PACK | Freq: Every day | ORAL | Status: DC
  Administered 2017-06-06 – 2017-06-07 (×2): 17 g via ORAL
  Filled 2017-06-06 (×2): qty 1

## 2017-06-06 MED ORDER — SIMETHICONE 80 MG PO CHEW
80.0000 mg | CHEWABLE_TABLET | Freq: Once | ORAL | Status: AC
  Administered 2017-06-06: 80 mg via ORAL
  Filled 2017-06-06: qty 1

## 2017-06-06 MED ORDER — BISACODYL 10 MG RE SUPP
10.0000 mg | Freq: Every day | RECTAL | Status: DC | PRN
  Administered 2017-06-07: 10 mg via RECTAL
  Filled 2017-06-06: qty 1

## 2017-06-06 MED ORDER — INSULIN ASPART 100 UNIT/ML ~~LOC~~ SOLN
0.0000 [IU] | Freq: Three times a day (TID) | SUBCUTANEOUS | Status: DC
  Administered 2017-06-06: 15 [IU] via SUBCUTANEOUS
  Administered 2017-06-07: 20 [IU] via SUBCUTANEOUS

## 2017-06-06 MED ORDER — INSULIN DETEMIR 100 UNIT/ML ~~LOC~~ SOLN
20.0000 [IU] | Freq: Every day | SUBCUTANEOUS | Status: DC
  Filled 2017-06-06: qty 0.2

## 2017-06-06 MED ORDER — INSULIN ASPART 100 UNIT/ML ~~LOC~~ SOLN
0.0000 [IU] | Freq: Three times a day (TID) | SUBCUTANEOUS | Status: DC
  Administered 2017-06-07: 20 [IU] via SUBCUTANEOUS

## 2017-06-06 MED ORDER — INSULIN ASPART 100 UNIT/ML ~~LOC~~ SOLN
7.0000 [IU] | Freq: Three times a day (TID) | SUBCUTANEOUS | Status: DC
  Administered 2017-06-06 (×2): 7 [IU] via SUBCUTANEOUS

## 2017-06-06 MED ORDER — INSULIN ASPART 100 UNIT/ML ~~LOC~~ SOLN
15.0000 [IU] | Freq: Once | SUBCUTANEOUS | Status: AC
  Administered 2017-06-06: 15 [IU] via SUBCUTANEOUS

## 2017-06-06 MED ORDER — SIMETHICONE 80 MG PO CHEW
80.0000 mg | CHEWABLE_TABLET | Freq: Once | ORAL | Status: AC
  Administered 2017-06-07: 80 mg via ORAL
  Filled 2017-06-06: qty 1

## 2017-06-06 MED ORDER — SENNOSIDES-DOCUSATE SODIUM 8.6-50 MG PO TABS
2.0000 | ORAL_TABLET | Freq: Two times a day (BID) | ORAL | Status: DC | PRN
  Administered 2017-06-07: 2 via ORAL
  Filled 2017-06-06: qty 2

## 2017-06-06 MED ORDER — INSULIN ASPART 100 UNIT/ML ~~LOC~~ SOLN
3.0000 [IU] | Freq: Three times a day (TID) | SUBCUTANEOUS | Status: DC
  Administered 2017-06-06: 3 [IU] via SUBCUTANEOUS

## 2017-06-06 NOTE — Progress Notes (Signed)
   Subjective: Doing well this AM. She is ready to get to rehab. She has been without a meter for approximately 3 months. When her meter was working her blood sugars were uncontrolled in the 300-400s. She is taking Lantus 13 units QHS and Novolog 3 units TID with meals. In the hospital she is require a lot more insulin. Will make recommendations of discharge. She has an FL2 filled out by her PCP at bedside. Will touch base with social work to see if she can be discharged today.   Objective: Vital signs in last 24 hours: Vitals:   06/05/17 1440 06/05/17 1700 06/05/17 2041 06/06/17 0448  BP: (!) 180/77 (!) 168/79 (!) 154/72 (!) 148/73  Pulse: 86 83 (!) 41 (!) 58  Resp: 16 16 18 19   Temp:  98.1 F (36.7 C) 98.4 F (36.9 C) 98.3 F (36.8 C)  TempSrc:  Oral Oral Oral  SpO2: 100% 100% 99% 100%  Weight:   138 lb 0.1 oz (62.6 kg)   Height:       General: Thin female resting comfortable  Pulm: Good air movement with no wheezing or crackles  CV: RRR, no murmurs, no rubs  Abdomen: Active bowel sounds, soft, no tenderness to palpation  Extremities: No LE edema  Assessment/Plan:  1. Gait Instability  - Likely 2/2 metabolic derangements from missing HD and cocurrent labyrinthitis/neuritis  - Patient continue to state that her symptoms have completely resolved after 3 rounds of HD - CT and MRI head were negative for posterior circulation stroke  - PT consulted and feel the patient would benefit from continued therapy at SNF as they suspect the patient has labyrinthitis/neuritis  - Medically stable awaiting placement   2. ESRD on HD TTS - Pertinent labs: K 4.2, CO2 25, BUN 17, AG 12, Ca 8.0, Hgb 9.0 - Nephrology following    3. Combined systolic and diastolic dysfunction  - Recent echo with EF 45-50% with G2DD and diffuse hypokineses - Will continue home regimen of Atorvastatin, Coreg, and Lasix   4. Type 2 DM - Glucose continues to be difficult to control  - Currently on Levemir 10  units QD + Moderate SSI + HS coverage - Home regimen is Lantus 10 units QD and Novolog 5 units TID - Will add meal time coverage today, Novolog 7 units TID in addition to resistance sliding scale. Will also increase basal insulin to 20 units  5. Hypertension  - Continue amlodipine and clonidine   Volume overload. Resolved Hyperkalemia. Resolved  Dispo: Anticipated discharge pending placement.   Ina Homes, MD 06/06/2017, 5:29 AM My Pager: [phone]

## 2017-06-06 NOTE — Progress Notes (Signed)
CBG this am at 0800 was 536, MD notified. Sliding scale, meal coverage, and levemir given.  Pt ate breakfast, CBG rechecked within 30 minutes, now 599. MD notified again, advised to recheck at 1000.  NT notified to recheck.

## 2017-06-06 NOTE — Clinical Social Work Note (Signed)
Clinical Social Work Assessment  Patient Details  Name: [name] MRN: [id number] Date of Birth: [birth year]  Date of referral:  06/05/17               Reason for consult:  Facility Placement                Permission sought to share information with:  Family Supports Permission granted to share information::  Yes, Verbal Permission Granted  Name::     Lyliana Dicenso  Agency::     Relationship::  Son  Contact Information:  [phone]  Housing/Transportation Living arrangements for the past 2 months:  [place] of Information:  Patient Patient Interpreter Needed:  None Criminal Activity/Legal Involvement Pertinent to Current Situation/Hospitalization:  No - Comment as needed Significant Relationships:  Adult Children, Other Family Members Lives with:  Adult Children (Patient and Darcus Austin live together) Do you feel safe going back to the place where you live?  No (Patient agreeable to ST rehab before returning home) Need for family participation in patient care:  No (Coment)  Care giving concerns:  Patient acknowledged that she is weak and can't walk safely, and is in agreement with ST rehab. Ms. Witts reported that prior to hospitalization, she was having problems climbing the steps that are outside their home.  Social Worker assessment / plan:  CSW talked with patient at the bedside regarding discharge disposition and recommendation of ST rehab. Ms. Juarez was dressed, sitting up on the side of the bed, and was alert, oriented, pleasant and engaged easily with CSW.  Patient reported that she has 3 children, Darcus Austin, [place] and [place] and 8 grandchildren. Patient was asked about recent/current substance abuse and patient denied current use and acknowledged that she last used cocaine 3/4 months ago and drank alcohol 2/3 months ago. She also reported that she last smoked half a cigarette last week.  Patient agreeable to ST rehab and recounted that she has been to 2  facilities before for rehab. Patient provided with facility responses and chose [facility].     Employment status:  Disabled (Comment on whether or not currently receiving Disability) Insurance information:  Medicare, Medicaid In [place] PT Recommendations:  [place] / Referral to community resources:  Other (Comment Required) (Patient provided with facility selections and provided preference)  Patient/Family's Response to care:  Patient reported no concerns regarding her care during hospitalization.  Patient/Family's Understanding of and Emotional Response to Diagnosis, Current Treatment, and Prognosis:  Patient expressed understanding with what brought her to hospital and her health issues and is agreeable to ST rehab for strengthening before going home.  Emotional Assessment Appearance:  Appears stated age Attitude/Demeanor/Rapport:  Other (Appropriate) Affect (typically observed):  Appropriate, Pleasant Orientation:  Oriented to Self, Oriented to Place, Oriented to  Time, Oriented to Situation Alcohol / Substance use:  Tobacco Use, Alcohol Use, Illicit Drugs (Patient reported that she quit smoking, last used cocaine 3/4 months ago and last smoked a cigarrette 2/3 months ago) Psych involvement (Current and /or in the community):  No (Comment)  Discharge Needs  Concerns to be addressed:  Discharge Planning Concerns Readmission within the last 30 days:  No Current discharge risk:  None Barriers to Discharge:  No Barriers Identified   Sable Feil, LCSW 06/06/2017, 5:31 PM

## 2017-06-06 NOTE — NC FL2 (Signed)
[place] LEVEL OF CARE SCREENING TOOL     IDENTIFICATION  Patient Name: [name] Birthdate: October 18, 1965 Sex: female Admission Date (Current Location): 06/03/2017  [place] and [place] Number:  [place] [id number] N Facility and Address:  [facility] [place] [address] [place], [place] [address]      Provider Number: [id number]  Attending Physician Name and Address:  Bartholomew Crews, MD  Relative Name and Phone Number:       Current Level of Care: Hospital Recommended Level of Care: [place] Prior Approval Number:    Date Approved/Denied:   PASRR Number: [id number]  Discharge Plan: SNF    Current Diagnoses: Patient Active Problem List   Diagnosis Date Noted  . Volume overload 06/03/2017  . Hyperglycemia 06/03/2017  . Right supracondylar humerus fracture, with nonunion, subsequent encounter 05/01/2017  . Malnutrition of moderate degree 02/28/2017  . Leg pain, left 02/25/2017  . Left leg pain 02/25/2017  . External hemorrhoids   . Diverticulosis of colon without hemorrhage   . Goals of care, counseling/discussion   . Palliative care encounter   . Cellulitis of left leg 01/16/2017  . Diabetes mellitus with complication ([place]) 92/92/4462  . Upper GI bleed 01/16/2017  . UGIB (upper gastrointestinal bleed)   . Anemia due to chronic kidney disease   . Cellulitis of left thigh 01/01/2017  . ESRD (end stage renal disease) ([place]) 01/01/2017  . Cocaine use disorder, severe, dependence ([place]) 08/29/2016  . Hepatitis C 08/29/2016  . Diabetic ulcer of calf ([place]) 08/29/2016  . Diabetic ulcer of toe ([place]) 08/29/2016  . Cerebrovascular accident (CVA) ([place]) 08/29/2016  . MDD (major depressive disorder) 08/28/2016  . Dyslipidemia associated with type 2 diabetes mellitus ([place]) 11/10/2015  . Hyperkalemia 09/17/2015  . History of vitamin D deficiency 06/20/2015  . Long term prescription opiate use 06/20/2015  . Chronic  pain syndrome 06/19/2015  . Anemia of chronic kidney failure 05/01/2015  . transmetatarsal amputation of the left foot 12/13/2014  . Unilateral visual loss 11/22/2014  . 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 ([place]) 11/22/2014  . Essential hypertension 11/09/2014  . Dizziness   . Chronic combined systolic (congestive) and diastolic (congestive) heart failure ([place])   . Rectal bleeding 04/23/2014  . Anemia, iron deficiency 04/13/2014  . Noncompliance 02/02/2014  . Hyponatremia 05/19/2013  . CKD (chronic kidney disease) stage 5, GFR less than 15 ml/min (HCC) 05/18/2013  . Seizure disorder ([place]) 02/09/2013    Orientation RESPIRATION BLADDER Height & Weight     Self, Time, Situation, Place  Normal Continent Weight: 138 lb 0.1 oz (62.6 kg) Height:  5\' 4"  (162.6 cm)  BEHAVIORAL SYMPTOMS/MOOD NEUROLOGICAL BOWEL NUTRITION STATUS    Convulsions/Seizures Continent Diet (Low sodium - Heart healthy)  AMBULATORY STATUS COMMUNICATION OF NEEDS Skin   Total Care (Patient did not ambulate with PT) Verbally Other (Comment) (Serous blister to left foot/heel)                       Personal Care Assistance Level of Assistance  Bathing, Feeding, Dressing Bathing Assistance: Limited assistance Feeding assistance: Independent Dressing Assistance: Limited assistance     Functional Limitations Info  Sight, Hearing, Speech Sight Info: Adequate Hearing Info: Adequate Speech Info: Adequate    SPECIAL CARE FACTORS FREQUENCY  PT (By licensed PT)     PT Frequency: Evaluated 11/1              Contractures Contractures Info: Present  Additional Factors Info  Code Status, Allergies, Insulin Sliding Scale Code Status Info: Full Allergies Info: Penicillins, Gabapentin Lyrica, Saphris, Tramadol   Insulin Sliding Scale Info: 0-20 Units 3X daily with meals; 0-5 Units daily at bedtime       Current Medications (06/06/2017):  This is the current hospital active medication list Current  Facility-Administered Medications  Medication Dose Route Frequency Provider Last Rate Last Dose  . 0.9 %  sodium chloride infusion  100 mL Intravenous PRN Roney Jaffe, MD      . 0.9 %  sodium chloride infusion  100 mL Intravenous PRN Roney Jaffe, MD      . 0.9 %  sodium chloride infusion  100 mL Intravenous PRN Roney Jaffe, MD      . 0.9 %  sodium chloride infusion  100 mL Intravenous PRN Roney Jaffe, MD      . acetaminophen (TYLENOL) tablet 650 mg  650 mg Oral Q6H PRN Kalman Shan Ratliff, DO   650 mg at 06/05/17 1711   Or  . acetaminophen (TYLENOL) suppository 650 mg  650 mg Rectal Q6H PRN Hoffman, Jessica Ratliff, DO      . albuterol (PROVENTIL) (2.5 MG/3ML) 0.083% nebulizer solution 2.5 mg  2.5 mg Nebulization Q6H PRN Hoffman, Jessica Ratliff, DO      . alteplase (CATHFLO ACTIVASE) injection 2 mg  2 mg Intracatheter Once PRN Roney Jaffe, MD      . amLODipine (NORVASC) tablet 10 mg  10 mg Oral QHS Ina Homes, MD   10 mg at 06/05/17 2146  . atorvastatin (LIPITOR) tablet 40 mg  40 mg Oral QHS Ina Homes, MD   40 mg at 06/05/17 2146  . carvedilol (COREG) tablet 25 mg  25 mg Oral BID WC Ina Homes, MD   25 mg at 06/06/17 0844  . cloNIDine (CATAPRES) tablet 0.1 mg  0.1 mg Oral Q8H Helberg, Justin, MD   0.1 mg at 06/06/17 3903  . collagenase (SANTYL) ointment   Topical Daily Helberg, Justin, MD      . furosemide (LASIX) tablet 40 mg  40 mg Oral Daily Ina Homes, MD   40 mg at 06/06/17 0936  . heparin injection 1,000 Units  1,000 Units Dialysis PRN Roney Jaffe, MD      . heparin injection 5,000 Units  5,000 Units Subcutaneous Q8H Valinda Party, DO   5,000 Units at 06/06/17 0092  . hydrocortisone (ANUSOL-HC) 2.5 % rectal cream   Rectal BID Ina Homes, MD      . hydrocortisone cream 1 %   Topical BID PRN Colbert Ewing, MD      . insulin aspart (novoLOG) injection 0-20 Units  0-20 Units Subcutaneous TID WC Helberg, Justin, MD      .  insulin aspart (novoLOG) injection 0-20 Units  0-20 Units Subcutaneous TID WC Bartholomew Crews, MD      . insulin aspart (novoLOG) injection 0-5 Units  0-5 Units Subcutaneous QHS Ina Homes, MD   4 Units at 06/05/17 2150  . insulin aspart (novoLOG) injection 7 Units  7 Units Subcutaneous TID WC Helberg, Justin, MD      . insulin aspart (novoLOG) injection 7 Units  7 Units Subcutaneous TID WC Bartholomew Crews, MD      . Derrill Memo ON 06/07/2017] insulin detemir (LEVEMIR) injection 20 Units  20 Units Subcutaneous Q breakfast Helberg, Justin, MD      . lidocaine (PF) (XYLOCAINE) 1 % injection 5 mL  5 mL Intradermal PRN Roney Jaffe, MD      .  lidocaine-prilocaine (EMLA) cream 1 application  1 application Topical PRN Roney Jaffe, MD   1 application at 28/76/81 2148  . lidocaine-prilocaine (EMLA) cream   Topical Once Ina Homes, MD   Stopped at 06/04/17 1023  . mupirocin ointment (BACTROBAN) 2 % 1 application  1 application Topical Daily Ina Homes, MD   1 application at 15/72/62 [phone]  . pentafluoroprop-tetrafluoroeth (GEBAUERS) aerosol 1 application  1 application Topical PRN Roney Jaffe, MD         Discharge Medications: Please see discharge summary for a list of discharge medications.  Relevant Imaging Results:  Relevant Lab Results:   Additional Information ss#[id number]. Dialysis patient TTS at [facility]; Left transmetatarsal amputation.  Sable Feil, LCSW

## 2017-06-06 NOTE — Progress Notes (Signed)
[facility] Progress Note   Subjective:   Seen in room. No CP or dyspnea. Hoping for further PT for her knee. Possibly for discharge today.  Objective Vitals:   06/05/17 1700 06/05/17 2041 06/06/17 0448 06/06/17 0838  BP: (!) 168/79 (!) 154/72 (!) 148/73 (!) 164/80  Pulse: 83 (!) 41 (!) 58 82  Resp: 16 18 19 18   Temp: 98.1 F (36.7 C) 98.4 F (36.9 C) 98.3 F (36.8 C) 97.8 F (36.6 C)  TempSrc: Oral Oral Oral Oral  SpO2: 100% 99% 100% 100%  Weight:  62.6 kg (138 lb 0.1 oz)    Height:       Physical Exam General: Well appearing, NAD Heart: RRR; no murmur Lungs: CTAB Extremities: No LE edema, wearing SCDs Dialysis Access: LUE AVG + bruit  Additional Objective Labs: Basic Metabolic Panel:  Recent Labs Lab 06/04/17 0433 06/04/17 1008 06/05/17 0902  NA 131* 131* 132*  K 4.0 4.0 4.2  CL 93* 92* 95*  CO2 24 29 25   GLUCOSE 172* 92 507*  BUN 17 18 17   CREATININE 3.46* 3.68* 3.41*  CALCIUM 8.4* 8.2* 8.0*  PHOS  --   --  4.1   Liver Function Tests:  Recent Labs Lab 06/03/17 1213 06/05/17 0902  AST 29  --   ALT 27  --   ALKPHOS 121  --   BILITOT 0.9  --   PROT 7.1  --   ALBUMIN 3.0* 2.9*   CBC:  Recent Labs Lab 06/03/17 1213 06/04/17 0433 06/05/17 0902  WBC 3.0* 3.6* 2.9*  NEUTROABS 2.2  --   --   HGB 9.7* 9.8* 9.0*  HCT 29.3* 30.0* 28.2*  MCV 89.3 90.1 92.5  PLT 129* 126* 123*   BCardiac Enzymes:  Recent Labs Lab 06/03/17 2227 06/04/17 0433  TROPONINI <0.03 <0.03   CBG:  Recent Labs Lab 06/05/17 1709 06/05/17 2035 06/05/17 2355 06/06/17 0357 06/06/17 0820  GLUCAP 294* 341* 423* 485* 536*    Studies/Results: Mr Brain Wo Contrast  Result Date: 06/04/2017 CLINICAL DATA:  51 year old female with dizziness, unsteady gait. Dialysis patient. EXAM: MRI HEAD WITHOUT CONTRAST TECHNIQUE: Multiplanar, multiecho pulse sequences of the brain and surrounding structures were obtained without intravenous contrast. COMPARISON:   Head CT 06/03/2017.  Brain MRI 11/22/2014 and earlier. FINDINGS: Brain: 2 questionable punctate areas of restricted diffusion in the anterior and superior left frontal lobe on series 3, images 38 and 40. No other restricted diffusion. Stable cerebral volume including prominent perisylvian volume loss. No midline shift, mass effect, evidence of mass lesion, ventriculomegaly, extra-axial collection or acute intracranial hemorrhage. Cervicomedullary junction and pituitary are within normal limits. Chronic patchy periatrial white matter T2 and FLAIR hyperintensity on the left with otherwise scattered small nonspecific T2 and FLAIR hyperintense foci in the white matter. No definite cortical encephalomalacia or chronic cerebral blood products. Chronic perivascular space left inferior lentiform. Deep gray matter nuclei, brainstem, and cerebellum remain within normal limits. Vascular: Major intracranial vascular flow voids are stable. Skull and upper cervical spine: Decreased T1 bone marrow signal since 2016. Stable and negative visualized cervical spine otherwise. Sinuses/Orbits: Stable and negative. Other: Visible internal auditory structures appear stable and normal. Mastoid air cells are clear. Scalp and face soft tissues appear negative. IMPRESSION: 1. No explanation for acute dizziness, but there are two questionable punctate areas of acute ischemia in the left frontal lobe (left MCA territory). These may be artifact. 2. No other acute intracranial abnormality and otherwise stable MRI appearance of the  brain since 2016. 3. 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 Electronically Signed   By: Genevie Ann M.D.   On: 06/04/2017 10:09   Medications: . sodium chloride    . sodium chloride    . sodium chloride    . sodium chloride     . amLODipine  10 mg Oral QHS  . atorvastatin  40 mg Oral QHS  . carvedilol  25 mg Oral BID WC  . cloNIDine  0.1 mg Oral Q8H   . collagenase   Topical Daily  . furosemide  40 mg Oral Daily  . heparin  5,000 Units Subcutaneous Q8H  . hydrocortisone   Rectal BID  . insulin aspart  0-15 Units Subcutaneous TID WC  . insulin aspart  0-5 Units Subcutaneous QHS  . insulin aspart  3 Units Subcutaneous TID WC  . insulin detemir  10 Units Subcutaneous Q breakfast  . lidocaine-prilocaine   Topical Once  . mupirocin ointment  1 application Topical Daily    Dialysis Orders: [place] [facility] 3.5h   63kg     2K/2.25Ca  LUE AVG    No Heparin Hectorol 84mcg IV q HD Mircera 25mcg IV q 2 weeks (last dosed 10/23) BMM: Ca acetate 2 tabs q ac, Renvela 3 q ac Outpatient Labs 10/25: Hgb 9.1 Phos 4.8 Ca 9.4 PTH 243  Assessment/Plan: 1. Hyperkalemia: due to missed HD, resolved 2. Hyperglycemia/DM: Improved from admit. 3. Leg pain/weakness/malaise: Due to hyperkalemia, resolved 4. ESRD; Continue TTS schedule, next HD 11/3, likely as outpt. 5. HTN/ volume: Closer to EDW now. 6. Anemia of CKD - Hgb 9's, Follow trend, on ESA as OP 7. Metabolic bone disease - Ca ok. Cont VDRA/binders 8. Nutrition - Renal diet/vitamins  9. Vertigo: Neg MRI, improving symptoms. 10. Dispo: Likely home today.  Veneta Penton, PA-C 06/06/2017, 9:13 AM  [facility] Pager: [phone]  Pt seen, examined and agree w A/P as above.  Kelly Splinter MD [employer] pager [phone]   06/06/2017, 12:40 PM

## 2017-06-06 NOTE — Discharge Summary (Signed)
Name: [name] MRN: [id number] DOB: 19-Jul-1966 51 y.o. PCP: Maurice Small, MD  Date of Admission: 06/03/2017 10:17 AM Date of Discharge: 06/07/2017 Attending Physician: Bartholomew Crews, MD  Discharge Diagnosis: 1. Acute Vestibular Syndrome  2. Volume Overload  3. ESRD on HD 4. Uncontrolled Type 2 Diabetes Mellitus   Active Problems:   Dizziness   Hyperkalemia   ESRD (end stage renal disease) (HCC)   Volume overload   Hyperglycemia  Discharge Medications: Allergies as of 06/07/2017      Reactions   Penicillins Hives, Other (See Comments)   Has patient had a PCN reaction causing immediate rash, facial/tongue/throat swelling, SOB or lightheadedness with hypotension: Yes Has patient had a PCN reaction causing severe rash involving mucus membranes or skin necrosis: No Has patient had a PCN reaction that required hospitalization No Has patient had a PCN reaction occurring within the last 10 years: No If all of the above answers are "NO", then may proceed with Cephalosporin use. Pt tolerates cefepime and ceftriaxone   Gabapentin Itching, Swelling   Lyrica [pregabalin] Swelling, Other (See Comments)   Facial and leg swelling   Saphris [asenapine] Swelling, Other (See Comments)   Facial swelling   Tramadol Swelling, Other (See Comments)   Leg swelling   Other Other (See Comments)   NO BLOOD PRODUCTS. PT IS A JEHOVAH WITNESS      Medication List    TAKE these medications   albuterol 108 (90 Base) MCG/ACT inhaler Commonly known as:  PROVENTIL HFA;VENTOLIN HFA Inhale 2 puffs into the lungs every 4 (four) hours as needed for wheezing or shortness of breath.   allopurinol 100 MG tablet Commonly known as:  ZYLOPRIM Take 1 tablet (100 mg total) by mouth every Tuesday, Thursday, and Saturday at 6 PM.   amLODipine 10 MG tablet Commonly known as:  NORVASC Take 1 tablet (10 mg total) by mouth at bedtime.   atorvastatin 40 MG tablet Commonly known as:  LIPITOR Take 1  tablet (40 mg total) by mouth at bedtime.   blood glucose meter kit and supplies Kit Dispense based on patient and insurance preference. Use up to four times daily as directed. (FOR ICD-9 250.00, 250.01).   calcium acetate 667 MG capsule Commonly known as:  PHOSLO Take 2 capsules (1,334 mg total) by mouth 3 (three) times daily with meals. What changed:  how much to take   carvedilol 25 MG tablet Commonly known as:  COREG Take 1 tablet (25 mg total) by mouth 2 (two) times daily with a meal.   cloNIDine 0.1 MG tablet Commonly known as:  CATAPRES Take 1 tablet (0.1 mg total) by mouth every 8 (eight) hours.   diphenhydrAMINE 25 mg capsule Commonly known as:  BENADRYL Take 25 mg by mouth every 6 (six) hours as needed for itching. Patient is given one capsule on dialysis days   feeding supplement (NEPRO CARB STEADY) Liqd Take 237 mLs by mouth 3 (three) times daily between meals.   furosemide 40 MG tablet Commonly known as:  LASIX Take 40 mg by mouth daily.   haloperidol 0.5 MG tablet Commonly known as:  HALDOL Take 1 tablet (0.5 mg total) by mouth every 8 (eight) hours as needed for agitation.   hydrocortisone 2.5 % rectal cream Commonly known as:  ANUSOL-HC Place rectally 2 (two) times daily.   insulin aspart 100 UNIT/ML injection Commonly known as:  novoLOG Inject 7 Units into the skin 3 (three) times daily with meals. What changed:  how much to take   insulin glargine 100 UNIT/ML injection Commonly known as:  LANTUS Inject 0.2 mLs (20 Units total) into the skin at bedtime. What changed:  how much to take   LORazepam 2 MG/ML injection Commonly known as:  ATIVAN Inject 1 mL (2 mg total) into the vein Every Tuesday,Thursday,and Saturday with dialysis.   multivitamin Tabs tablet Take 1 tablet by mouth at bedtime.   mupirocin ointment 2 % Commonly known as:  BACTROBAN Apply 1 application topically daily.   Oxycodone HCl 10 MG Tabs Take 1 tablet (10 mg total) by  mouth every 6 (six) hours as needed.   sertraline 50 MG tablet Commonly known as:  ZOLOFT Take 1 tablet (50 mg total) by mouth daily.   SLOW FE PO Take 60 tablets by mouth daily.   traZODone 50 MG tablet Commonly known as:  DESYREL Take 0.5 tablets (25 mg total) by mouth at bedtime.   vitamin A 10000 UNIT capsule Take 10,000 Units by mouth daily.   vitamin C 500 MG tablet Commonly known as:  ASCORBIC ACID Take 500 mg by mouth daily.      Disposition and follow-up:   TashaTasha [name] was discharged from [facility] in Stable condition.  At the hospital follow up visit please address:  1.  DM. Ensure she picked up her glucose meter and has all the materials needed. Discuss her average blood sugars and consider increasing her basal and meal time insulin if needed.   2.  Labs / imaging needed at time of follow-up: None  3.  Pending labs/ test needing follow-up: None  Follow-up Appointments: Follow-up Information    Maurice Small, MD Follow up.   Specialty:  Family Medicine Contact information: [place] [id number] [place] Hospital Course by problem list: Active Problems:   Dizziness   Hyperkalemia   ESRD (end stage renal disease) (HCC)   Volume overload   Hyperglycemia   1. Acute Vestibular Syndrome. [name] is a 51 y.o. Female with a PMHx significant for uncontrolled type 2 DM and ESRD on HD who presented to the ED with SOB, hyperglycemia, hyperkalemia, and unsteady gait of 4-5 days duration after missing 2 HD sessions. She was taken for emergent HD and 2 subsequent sessions. She afterwards endorsed complete resolution of her symptoms. PT was consulted for her gait instability and felt she was an appropriate candidate for SNF placement. Her acute vestibular syndrome was likely multi-factorial relating to her several metabolic derangements and cocurrent labyrinthitis/neuritis.   2. Volume Overload. Ms.  [name] initially presented with signs and symptoms of volume overload. Complete resolution of symptoms were achieved after 3 days of consecutive HD sessions. It was discussed how important it is to attend her HD session and follow a fluid restriction.    3. ESRD on HD. [name] received HD while in the hospital for her volume overload and acute vestibular syndrome. She can resume her outpatient HD schedule of TTS per nephrology.   4. Uncontrolled Type 2 Diabetes Mellitus. Ms. Gibby initially presented with hyperglycemia, and elevated anion-gap, but no serum ketones. She responded well to an insulin drip then transitioned to SubQ insulin. Her CBGs remained extremely difficult to control. She was on Lantus 13 units QHS and Novolog 5 units TID. Her insulin regimen was changed to Lantus 20 units QHS and and Novolog 7 units TID. She was prescribed a  glucose meter because hers broke several months ago. She will likely need her insulin regimen to be increased for better glycemic control.   Discharge Vitals:   BP 138/78   Pulse 78   Temp 98.1 F (36.7 C) (Oral)   Resp 13   Ht 5' 4" (1.626 m)   Wt 150 lb 2.1 oz (68.1 kg)   LMP  (LMP Unknown)   SpO2 100%   BMI 25.77 kg/m   Pertinent Labs, Studies, and Procedures:   Chest X-ray IMPRESSION: 1. Progressive changes of congestive heart failure with pulmonary interstitial edema and bilateral pleural effusions. 2. Right lower lobe infiltrate/pneumonia cannot be excluded.  CT Head IMPRESSION: Severely age advanced atrophy, unchanged, without acute abnormality.  MRI Head IMPRESSION: 1. No explanation for acute dizziness, but there are two questionable punctate areas of acute ischemia in the left frontal lobe (left MCA territory). These may be artifact. 2. No other acute intracranial abnormality and otherwise stable MRI appearance of the brain since 2016. 3. 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  Discharge Instructions: Discharge Instructions    Diet - low sodium heart healthy    Complete by:  As directed    Increase activity slowly    Complete by:  As directed      Signed: Ina Homes, MD 06/07/2017, 9:44 AM   My Pager: [phone]

## 2017-06-06 NOTE — Progress Notes (Signed)
Pt CBG recheck was 500, MD notified.

## 2017-06-06 NOTE — Clinical Social Work Note (Addendum)
Patient provided with facility responses after 5 pm and chose [facility]. Patient can discharge to facility on Saturday, 11/3. Covering weekend CSW will be advised.  Senica Crall Givens, MSW, LCSW Licensed Clinical Social Worker [place] [phone]

## 2017-06-06 NOTE — Clinical Social Work Placement (Signed)
   CLINICAL SOCIAL WORK PLACEMENT  NOTE  Date:  06/06/2017  Patient Details  Name: [name] MRN: [id number] Date of Birth: 03/24/1966  Clinical Social Work is seeking post-discharge placement for this patient at the [place] level of care (*CSW will initial, date and re-position this form in  chart as items are completed):  No (Patient provided with facility responses.)   Patient/family provided with [place] Work Department's list of facilities offering this level of care within the geographic area requested by the patient (or if unable, by the patient's family).  Yes   Patient/family informed of their freedom to choose among providers that offer the needed level of care, that participate in Medicare, Medicaid or managed care program needed by the patient, have an available bed and are willing to accept the patient.  No   Patient/family informed of [facility]'s ownership interest in [facility] and [facility], as well as of the fact that they are under no obligation to receive care at these facilities.  PASRR submitted to EDS on       PASRR number received on       Existing PASRR number confirmed on 06/06/17     FL2 transmitted to all facilities in geographic area requested by pt/family on 06/06/17     FL2 transmitted to all facilities within larger geographic area on       Patient informed that his/her managed care company has contracts with or will negotiate with certain facilities, including the following:        Yes   Patient/family informed of bed offers received.  Patient chooses bed at [facility]     Physician recommends and patient chooses bed at      Patient to be transferred to [facility] on  .  Patient to be transferred to facility by [facility]     Patient family notified on 06/06/17 of transfer.  Name of family member notified:        PHYSICIAN       Additional Comment:     _______________________________________________ Sable Feil, LCSW 06/06/2017, 5:40 PM

## 2017-06-07 MED ORDER — ACETAMINOPHEN 325 MG PO TABS
ORAL_TABLET | ORAL | Status: AC
Start: 2017-06-07 — End: 2017-06-07
  Filled 2017-06-07: qty 2

## 2017-06-07 MED ORDER — INSULIN ASPART 100 UNIT/ML ~~LOC~~ SOLN: 7.0000 [IU] | Freq: Three times a day (TID) | SUBCUTANEOUS | 0 refills | Status: DC

## 2017-06-07 MED ORDER — BLOOD GLUCOSE MONITOR KIT
PACK | 0 refills | Status: DC
Start: 2017-06-07 — End: 2017-08-07

## 2017-06-07 MED ORDER — INSULIN GLARGINE 100 UNIT/ML ~~LOC~~ SOLN: 20.0000 [IU] | Freq: Every day | SUBCUTANEOUS | 0 refills | Status: DC

## 2017-06-07 MED ORDER — INSULIN DETEMIR 100 UNIT/ML ~~LOC~~ SOLN
20.0000 [IU] | Freq: Every day | SUBCUTANEOUS | Status: DC
  Administered 2017-06-07: 20 [IU] via SUBCUTANEOUS
  Filled 2017-06-07: qty 0.2

## 2017-06-07 NOTE — Progress Notes (Signed)
Unable to give report,patient not yet assigned for her nurse at [facility] to call them back after 10 minutes.

## 2017-06-07 NOTE — Progress Notes (Signed)
   Subjective: Doing well this AM. Feels great with no SOB, chest pain, dizziness, or weakness. Discussed the plan to discharge her to [facility] today after HD. Also we will increase her insulin regimen on discharge. She will be given a paper script for a glucose meter. All questions and concerns addressed.   Objective: Vital signs in last 24 hours: Vitals:   06/06/17 1357 06/06/17 1618 06/06/17 2151 06/07/17 0424  BP: (!) 160/72 (!) 163/73 (!) 150/69 133/61  Pulse: 80 81 82 76  Resp: 18 20 18 19   Temp: 97.9 F (36.6 C) 98.6 F (37 C) 98.4 F (36.9 C) 98.5 F (36.9 C)  TempSrc: Oral Oral Oral Oral  SpO2: 100% 100% 100% 100%  Weight:   137 lb 5.6 oz (62.3 kg)   Height:       General: Thin female in no acute distress Pulm: Good air movement with no wheezing or crackles  CV: RRR, no murmurs, no rubs  Abdomen: Active bowel sounds, soft, no tenderness to palpation  Extremities: No LE edema, extremities are warm and dry   Assessment/Plan:  1. Gait Instability  - Likely 2/2 metabolic derangements from missing HD and cocurrent labyrinthitis/neuritis  - Patient continue to state that her symptoms have completely resolved after 3 rounds of HD - CT and MRI head were negative for posterior circulation stroke  - PT consulted and feel the patient would benefit from continued therapy at SNF as they suspect the patient has labyrinthitis/neuritis  - Patient to discharge to [facility] today after HD  2. ESRD on HD TTS - Pertinent labs: K 4.2, CO2 25, BUN 17, AG 12, Ca 8.0, Hgb 9.0 - Nephrology following , HD today  3. Combined systolic and diastolic dysfunction  - Recent echo with EF 45-50% with G2DD and diffuse hypokineses - Continue Atorvastatin, Coreg, and Lasix   4. Type 2 DM - Glucose continues to be difficult to control  - Currently on Levemir 20 units QD + Novolog 7 units TID with meals+ resistant SSI + HS coverage - Will discharge on Lantus 20 units QD and Novolog 7 units  with meals. Will like need to increase her insulin  5. Hypertension  - Continue amlodipine and clonidine   Volume overload. Resolved Hyperkalemia. Resolved  Dispo: Anticipated discharge in approximately 0 day(s).   Ina Homes, MD 06/07/2017, 6:14 AM My Pager: [phone]

## 2017-06-07 NOTE — Procedures (Signed)
Pt stable on HD, 4 L removed today, will not change her dry wt upon discharge, keep at 63kg.  For dc home today.   I was present at this dialysis session, have reviewed the session itself and made  appropriate changes Kelly Splinter MD [place] pager [phone]   06/07/2017, 11:40 AM

## 2017-06-07 NOTE — Discharge Instructions (Signed)
Thank you for allowing [place] to provide your care:  - We changed your insulin regimen to Lantus 20 units daily and Novolog 7 units with meals   - Please get your new glucose meter and check your blood sugars 3 times a day   - Continue to take you medications as prescribed.   - Continue to go to HD

## 2017-06-07 NOTE — Progress Notes (Addendum)
CSW was informed that [facility] unable to take pt. Pt to be transported to [facility] today by son Darcus [place] [phone]. CSW spoke with Dedra at [facility] and confirmed that they are able to take pt. RN to call report to [phone]. Pt to be transported to [facility] by son. There are no further CSW needs. CSW signing off.    Virgie Dad Sharmane Dame, MSW, [facility] Clinical Social Worker [phone]

## 2017-06-07 NOTE — Progress Notes (Addendum)
1:02pm: CSW spoke with [place] from [facility]. At this time Roselyn is unsure as to if pt is able to come due to not having any updated information from Freda Munro ([place]) on pt at this time Roselyn to call CSW back in 15 mins with an update. CSW will update doctor at this time.   CSW received report that pt is to go to [facility] today. CSW have reached out to [facility] three times for updates on taking pt and to ensure that someone will be there to admit pt. At this time CSW has still not confirmed if pt is able to go back to [facility].   Virgie Dad Kellianne Ek, MSW, [place] [facility] Clinical Social Worker [phone]

## 2017-06-07 NOTE — Progress Notes (Signed)
Report given to [facility] LPN.

## 2017-06-07 NOTE — Progress Notes (Signed)
OT Cancellation Note  Patient Details Name: [name] MRN: [id number] DOB: 1966-01-21   Cancelled Treatment:    Reason Eval/Treat Not Completed: Patient at procedure or test/ unavailable. Pt at HD. Will continue to follow for eval as schedule allows.  [place] 06/07/2017, 8:19 AM  Hulda Humphrey OTR/L [phone]

## 2017-06-09 LAB — GLUCOSE, CAPILLARY: Glucose-Capillary: 367 mg/dL — ABNORMAL HIGH (ref 65–99)

## 2017-06-11 ENCOUNTER — Ambulatory Visit (INDEPENDENT_AMBULATORY_CARE_PROVIDER_SITE_OTHER): Payer: Medicare Other | Admitting: Neurology

## 2017-06-11 ENCOUNTER — Encounter: Payer: Self-pay | Admitting: Neurology

## 2017-06-11 VITALS — BP 130/64 | HR 72 | Ht 64.0 in | Wt 152.8 lb

## 2017-06-11 DIAGNOSIS — E1142 Type 2 diabetes mellitus with diabetic polyneuropathy: Secondary | ICD-10-CM

## 2017-06-11 DIAGNOSIS — R296 Repeated falls: Secondary | ICD-10-CM | POA: Diagnosis not present

## 2017-06-11 DIAGNOSIS — R42 Dizziness and giddiness: Secondary | ICD-10-CM | POA: Diagnosis not present

## 2017-06-11 DIAGNOSIS — R2681 Unsteadiness on feet: Secondary | ICD-10-CM

## 2017-06-11 NOTE — Patient Instructions (Signed)
1.  We will check MRA of head to look at the blood vessels in the head 2.  You will need physical therapy for balance and dizziness.

## 2017-06-11 NOTE — Addendum Note (Signed)
Addended by: Clois Comber on: 06/11/2017 08:52 AM   Modules accepted: Orders

## 2017-06-11 NOTE — Progress Notes (Signed)
NEUROLOGY CONSULTATION NOTE  [name] MRN: [id number] DOB: 19-Apr-1966  Referring provider: Dr. Marval Regal Primary care provider: Dr. Mellody Drown  Reason for consult:  Unsteady gait  HISTORY OF PRESENT ILLNESS: [name] is a 51 year old left-handed African American female with uncontrolled type 2 diabetes and ESRD on HD, chronic heart failure, and history of TIA in 2009 who presents for unsteady gait.  CT and MRI of head personally reviewed.  History supplemented by hospital notes.  She began experiencing dizziness and unsteady gait about 3 to 4 months ago.  She describes the dizziness as sensation that she is spinning.  It occurs if she turns in bed, stands up or moves around.  It lasts a minute or two if she is still but will resume with movement or walking.  It causes her to be very unsteady on her feet.  She veers to either side and sometimes back steps.  She has had several falls.  The dizziness had caused her to miss 2 hemodialysis sessions, which increased her symptoms.  She presented to [facility] on 06/03/17 for further evaluation.  She was found to have volume overload, K 7.3 with associated peaked T wave on EKG and CBG greater than 600.  She underwent emergent hemodialysis for total of 3 sessions.  CT of head demonstrated advanced atrophy but no acute findings.  MRI of head revealed questionable punctate areas of acute ischemia in the left frontal region which may be artifact, but no etiology to explain dizziness.  The dizziness and unsteady gait persists.  She was previously independent but now uses a walker to ambulate.  She denies double vision.  She does report history of vertigo, although the room was spinning (not herself).  PAST MEDICAL HISTORY: Past Medical History:  Diagnosis Date  . Anemia   . Anxiety   . Bipolar disorder, unspecified ([place]) 02/06/2014  . Chronic combined systolic and diastolic CHF (congestive heart failure) (HCC)    EF 40% by echo and 48% by  stress test  . Chronic pain syndrome   . Depression   . ESRD on hemodialysis ([place])   . GERD (gastroesophageal reflux disease)   . Glaucoma   . Headache    sinus headaches  . Hepatitis C   . High cholesterol   . History of hiatal hernia   . History of vitamin D deficiency 06/20/2015  . Hypertension   . MRSA infection ?2014   "on the back of my head; spread throughout my bloodstream"  . Neuropathy   . Refusal of blood transfusions as patient is Jehovah's Witness   . Schizoaffective disorder, unspecified condition 02/08/2014  . Seizure [facility]) dx'd 2014   "don't know what kind; last one was ~ 04/2014"  . Stroke [facility])    TIA 2009  . TIA (transient ischemic attack) 2010  . Type II diabetes mellitus ([place])    INSULIN DEPENDENT    PAST SURGICAL HISTORY: Past Surgical History:  Procedure Laterality Date  . ABDOMINAL HYSTERECTOMY  2014  . ABDOMINAL HYSTERECTOMY  2010  . EYE SURGERY Bilateral 2010   Lasik  . FOOT AMPUTATION Left    due diabetic neuropathy, could not feel ulcer on bottom of foot  . REFRACTIVE SURGERY Bilateral 2013  . TONSILLECTOMY  1970's  . TUBAL LIGATION  2000    MEDICATIONS: Current Outpatient Medications on File Prior to Visit  Medication Sig Dispense Refill  . albuterol (PROVENTIL HFA;VENTOLIN HFA) 108 (90 Base) MCG/ACT inhaler Inhale 2  puffs into the lungs every 4 (four) hours as needed for wheezing or shortness of breath. 1 Inhaler 0  . allopurinol (ZYLOPRIM) 100 MG tablet Take 1 tablet (100 mg total) by mouth every Tuesday, Thursday, and Saturday at 6 PM. 60 tablet 1  . amLODipine (NORVASC) 10 MG tablet Take 1 tablet (10 mg total) by mouth at bedtime. 30 tablet 0  . atorvastatin (LIPITOR) 40 MG tablet Take 1 tablet (40 mg total) by mouth at bedtime. 30 tablet 0  . blood glucose meter kit and supplies KIT Dispense based on patient and insurance preference. Use up to four times daily as directed. (FOR ICD-9 250.00, 250.01). 1 each 0  . calcium acetate (PHOSLO)  667 MG capsule Take 2 capsules (1,334 mg total) by mouth 3 (three) times daily with meals. (Patient taking differently: Take 667 mg by mouth 3 (three) times daily with meals. ) 240 capsule 0  . carvedilol (COREG) 25 MG tablet Take 1 tablet (25 mg total) by mouth 2 (two) times daily with a meal. 60 tablet 0  . cloNIDine (CATAPRES) 0.1 MG tablet Take 1 tablet (0.1 mg total) by mouth every 8 (eight) hours. 90 tablet 11  . diphenhydrAMINE (BENADRYL) 25 mg capsule Take 25 mg by mouth every 6 (six) hours as needed for itching. Patient is given one capsule on dialysis days    . Ferrous Sulfate (SLOW FE PO) Take 60 tablets by mouth daily.    . furosemide (LASIX) 40 MG tablet Take 40 mg by mouth daily.    . haloperidol (HALDOL) 0.5 MG tablet Take 1 tablet (0.5 mg total) by mouth every 8 (eight) hours as needed for agitation. (Patient not taking: Reported on 06/03/2017) 30 tablet 0  . hydrocortisone (ANUSOL-HC) 2.5 % rectal cream Place rectally 2 (two) times daily. 30 g 0  . insulin aspart (NOVOLOG) 100 UNIT/ML injection Inject 7 Units into the skin 3 (three) times daily with meals. 5 mL 0  . insulin glargine (LANTUS) 100 UNIT/ML injection Inject 0.2 mLs (20 Units total) into the skin at bedtime. 7 mL 0  . LORazepam (ATIVAN) 2 MG/ML injection Inject 1 mL (2 mg total) into the vein Every Tuesday,Thursday,and Saturday with dialysis. (Patient not taking: Reported on 06/03/2017) 13 mL 0  . multivitamin (RENA-VIT) TABS tablet Take 1 tablet by mouth at bedtime. 30 tablet 0  . mupirocin ointment (BACTROBAN) 2 % Apply 1 application topically daily.  0  . Nutritional Supplements (FEEDING SUPPLEMENT, NEPRO CARB STEADY,) LIQD Take 237 mLs by mouth 3 (three) times daily between meals. 90 Can 0  . Oxycodone HCl 10 MG TABS Take 1 tablet (10 mg total) by mouth every 6 (six) hours as needed. (Patient not taking: Reported on 06/03/2017) 10 tablet 0  . sertraline (ZOLOFT) 50 MG tablet Take 1 tablet (50 mg total) by mouth daily.  30 tablet 0  . traZODone (DESYREL) 50 MG tablet Take 0.5 tablets (25 mg total) by mouth at bedtime. 30 tablet 0  . vitamin A 10000 UNIT capsule Take 10,000 Units by mouth daily.    . vitamin C (ASCORBIC ACID) 500 MG tablet Take 500 mg by mouth daily.     No current facility-administered medications on file prior to visit.     ALLERGIES: Allergies  Allergen Reactions  . Penicillins Hives and Other (See Comments)    Has patient had a PCN reaction causing immediate rash, facial/tongue/throat swelling, SOB or lightheadedness with hypotension: Yes Has patient had a PCN reaction causing severe  rash involving mucus membranes or skin necrosis: No Has patient had a PCN reaction that required hospitalization No Has patient had a PCN reaction occurring within the last 10 years: No If all of the above answers are "NO", then may proceed with Cephalosporin use. Pt tolerates cefepime and ceftriaxone  . Gabapentin Itching and Swelling  . Lyrica [Pregabalin] Swelling and Other (See Comments)    Facial and leg swelling  . Saphris [Asenapine] Swelling and Other (See Comments)    Facial swelling  . Tramadol Swelling and Other (See Comments)    Leg swelling  . Other Other (See Comments)    NO BLOOD PRODUCTS. PT IS A JEHOVAH WITNESS    FAMILY HISTORY: Family History  Problem Relation Age of Onset  . Hypertension Mother   . Diabetes Mother   . Hypertension Father   . Diabetes Father     SOCIAL HISTORY: Social History   Socioeconomic History  . Marital status: Single    Spouse name: Not on file  . Number of children: 3  . Years of education: 78  . Highest education level: 12th grade  Social Needs  . Financial resource strain: Not on file  . Food insecurity - worry: Not on file  . Food insecurity - inability: Not on file  . Transportation needs - medical: Not on file  . Transportation needs - non-medical: Not on file  Occupational History  . Occupation: disabled  Tobacco Use  . Smoking  status: Former Smoker    Packs/day: 1.00    Years: 20.00    Pack years: 20.00    Types: Cigarettes    Last attempt to quit: 03/31/2015    Years since quitting: 2.2  . Smokeless tobacco: Never Used  . Tobacco comment: "stopped smoking in ~ 2013; restarted last week; stopped again 06/17/2014"  Substance and Sexual Activity  . Alcohol use: Yes    Alcohol/week: 1.8 oz    Types: 3 Standard drinks or equivalent per week    Comment: 05/01/15 patient denies alcohol use.  . Drug use: Yes    Types: "Crack" cocaine, Cocaine    Comment: pt reports last use June 2018  . Sexual activity: Not Currently    Birth control/protection: Abstinence  Other Topics Concern  . Not on file  Social History Narrative   ** Merged History Encounter **   Single, lives with son in a one story home. Unable to exercise. Avoids caffeine. 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        REVIEW OF SYSTEMS: Constitutional: No fevers, chills, or sweats, no generalized fatigue, change in appetite Eyes: No visual changes, double vision, eye pain Ear, nose and throat: No hearing loss, ear pain, nasal congestion, sore throat Cardiovascular: No chest pain, palpitations Respiratory:  No shortness of breath at rest or with exertion, wheezes GastrointestinaI: No nausea, vomiting, diarrhea, abdominal pain, fecal incontinence Genitourinary:  No dysuria, urinary retention or frequency Musculoskeletal:  No neck pain, back pain Integumentary: No rash, pruritus, skin lesions Neurological: as above Psychiatric: No depression, insomnia, anxiety Endocrine: No palpitations, fatigue, diaphoresis, mood swings, change in appetite, change in weight, increased thirst Hematologic/Lymphatic:  No purpura, petechiae. Allergic/Immunologic: no itchy/runny eyes, nasal congestion, recent allergic reactions, rashes  PHYSICAL EXAM: Vitals:   06/11/17 0743  BP: 130/64  Pulse: 72  SpO2: 98%   General:  No acute distress.   Head:  Normocephalic/atraumatic Eyes:  fundi examined but not visualized Neck: supple, no paraspinal tenderness,  full range of motion Back: No paraspinal tenderness Heart: regular rate and rhythm Lungs: Clear to auscultation bilaterally. Vascular: No carotid bruits. Neurological Exam: Mental status: alert and oriented to person, place, and time, recent and remote memory intact, fund of knowledge intact, attention and concentration intact, speech fluent and not dysarthric, language intact. Cranial nerves: CN I: not tested CN II: pupils equal, round and reactive to light, peripheral vision loss CN III, IV, VI:  full range of motion, no nystagmus, no ptosis CN V: facial sensation intact CN VII: upper and lower face symmetric CN VIII: hearing intact CN IX, X: gag intact, uvula midline CN XI: sternocleidomastoid and trapezius muscles intact CN XII: tongue midline Bulk & Tone: normal, no fasciculations. Motor:  5-/5 in lower extremities, 5/5 upper extremities Sensation:  Pinprick, temperature and vibration sensation significantly decreased in the lower extremities up to below the knees.. Deep Tendon Reflexes:  2+ throughout, toes downgoing on right (amputated on left) Finger to nose testing:  Without dysmetria.  Heel to shin:  Without dysmetria.  Gait:  Ataxic gait.  Able to turn but cautiously.  Unable to tandem walk. Romberg with sway..  IMPRESSION: 1.  Vertigo.  Vertigo appears positional and would otherwise be consistent with benign paroxysmal positional vertigo.  However, she has persistent dizziness when she is up and on her feet, which requires further evaluation.  The MRI of brain did not reveal any CVA that would explain the dizziness.  However, I would like to evaluate the vertebrobasilar system. 2.  Unsteady gait/frequent falls.  Most likely due to the vertigo and complicated by underlying diabetic polyneuropathy.  She does not exhibit any myelopathic signs on  exam to suggest cervical stenosis/cord lesion.  PLAN: 1.  Check MRA of head to evaluate vertebrobasilar system 2.  Physical therapy for gait/balance and vestibular rehab 3.  Further recommendations pending MRA results. 4.  She does have diabetes and peripheral vision loss.  She should follow up with ophthalmology.  Thank you for allowing me to take part in the care of this patient.  Metta Clines, DO  CC:  Donato Heinz, MD  Donnalee Curry, MD

## 2017-06-12 IMAGING — CR DG CHEST 1V PORT
1 series · 1 of 1 positions shown · non-contrast
Comparison: 01/15/2016

CLINICAL DATA: Shortness breath, chest pain and cough for 3 days.

EXAM:
PORTABLE CHEST 1 VIEW

[AP]
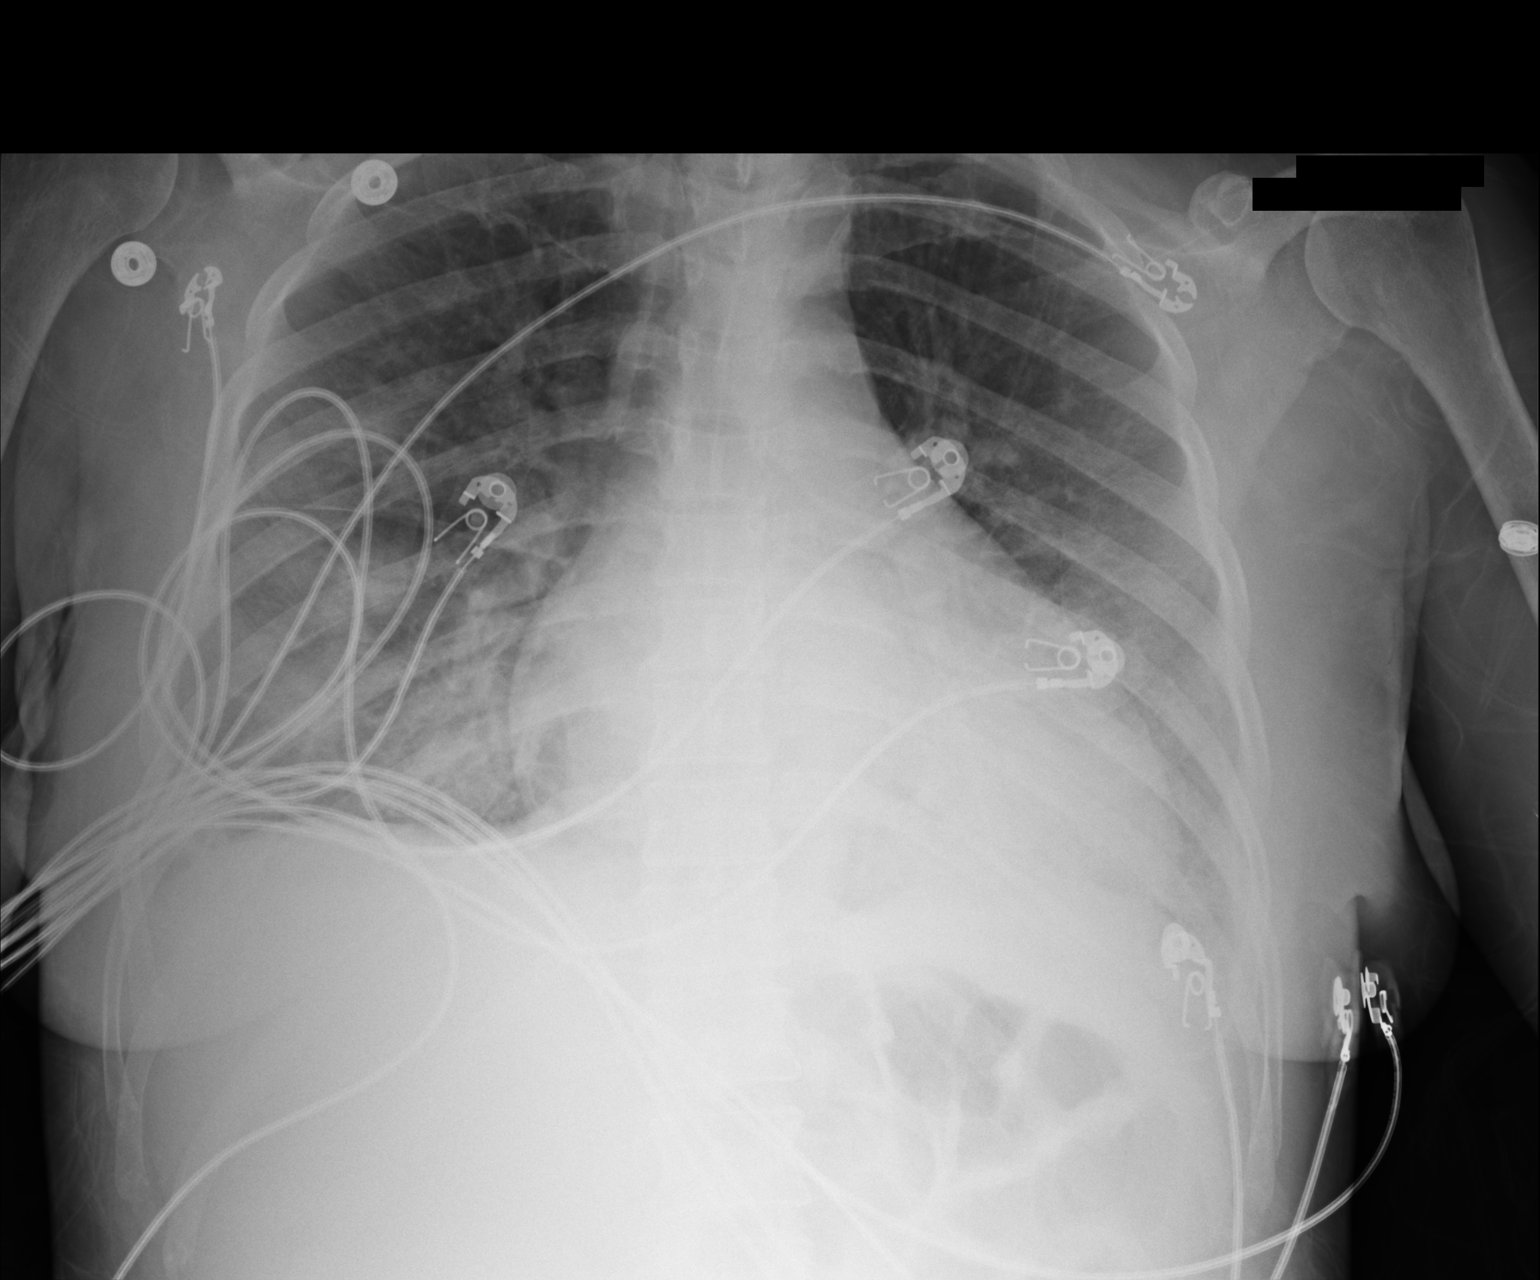

[1 of 1 positions shown; findings below may reference images not displayed]

FINDINGS: Cardiomediastinal silhouette is enlarged. Mediastinal contours
appear intact.

There is no evidence of pneumothorax. There is interstitial
pulmonary edema. However there is a more confluent airspace
consolidation in the right lower lobe.

Osseous structures are without acute abnormality. Soft tissues are
grossly normal.
IMPRESSION: Right lower lobe airspace opacity which may represent pneumonia
versus asymmetric pulmonary edema.

Enlarged cardiac silhouette with interstitial pulmonary edema.

## 2017-06-30 ENCOUNTER — Telehealth: Payer: Self-pay | Admitting: Neurology

## 2017-06-30 NOTE — Telephone Encounter (Signed)
Pt left a message saying she has not heard anything about the therapy for her dizziness and wants a call back regarding this

## 2017-07-01 ENCOUNTER — Ambulatory Visit: Payer: Self-pay | Admitting: Internal Medicine

## 2017-07-01 NOTE — Telephone Encounter (Signed)
Called and LM on VM for Pt, gave [facility] ph # for [place]

## 2017-07-06 IMAGING — DX DG FOOT COMPLETE 3+V*L*
3 series · 3 of 3 positions shown · non-contrast
Comparison: Radiographs 09/17/2015

CLINICAL DATA: Left foot ulcer. Patient reports ulcer appeared last
night.

EXAM:
LEFT FOOT - COMPLETE 3+ VIEW

[foot ap]
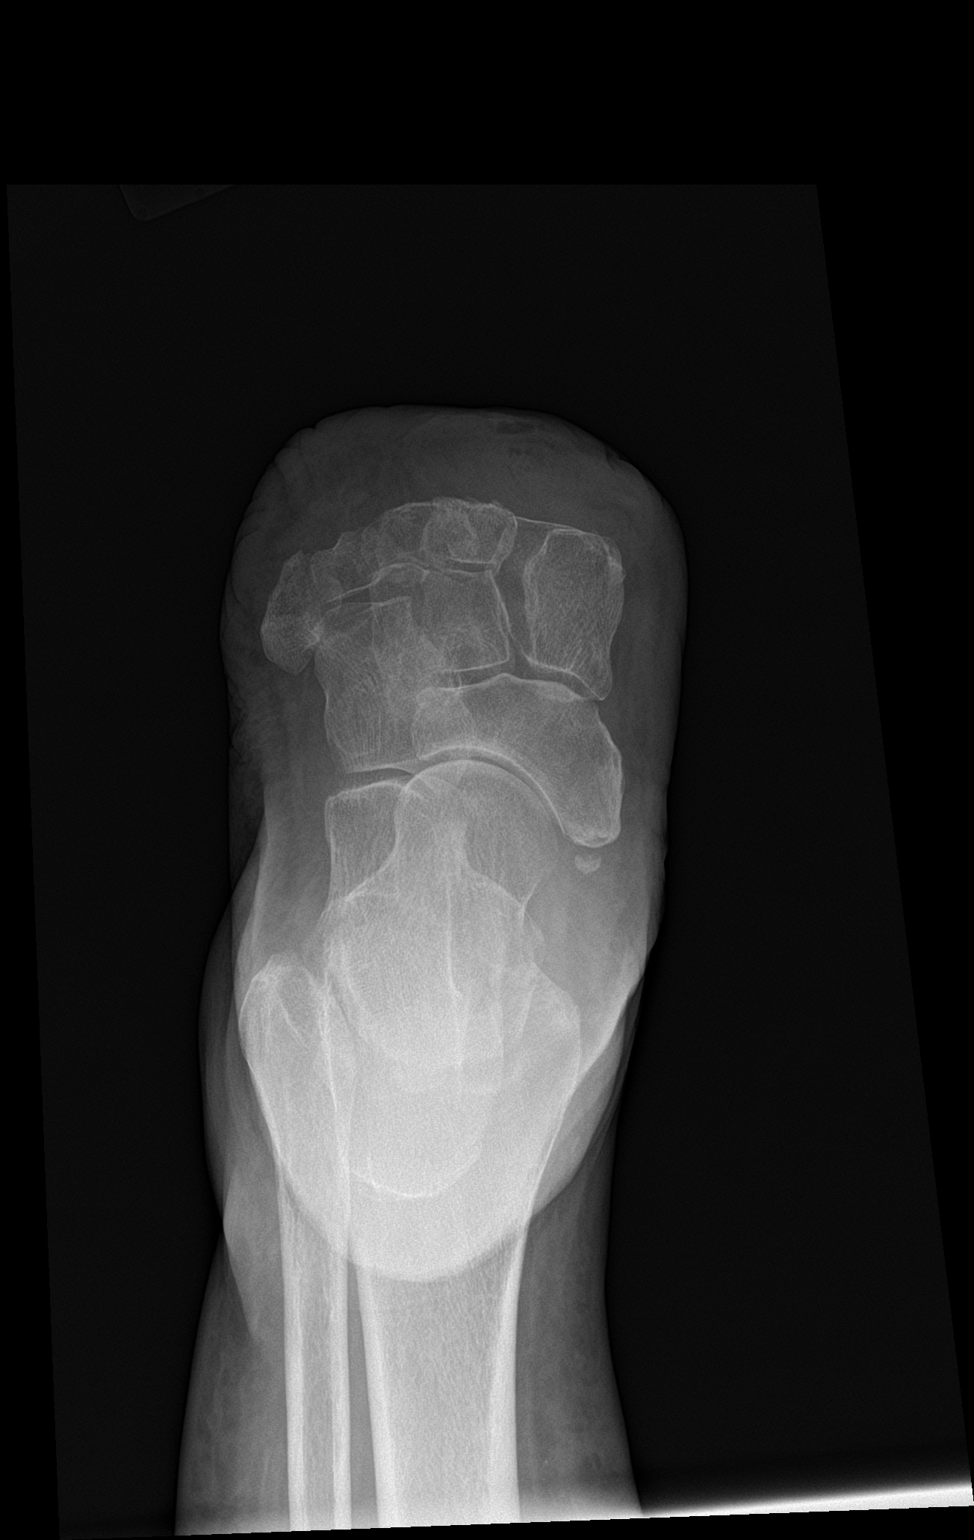

[foot obl]
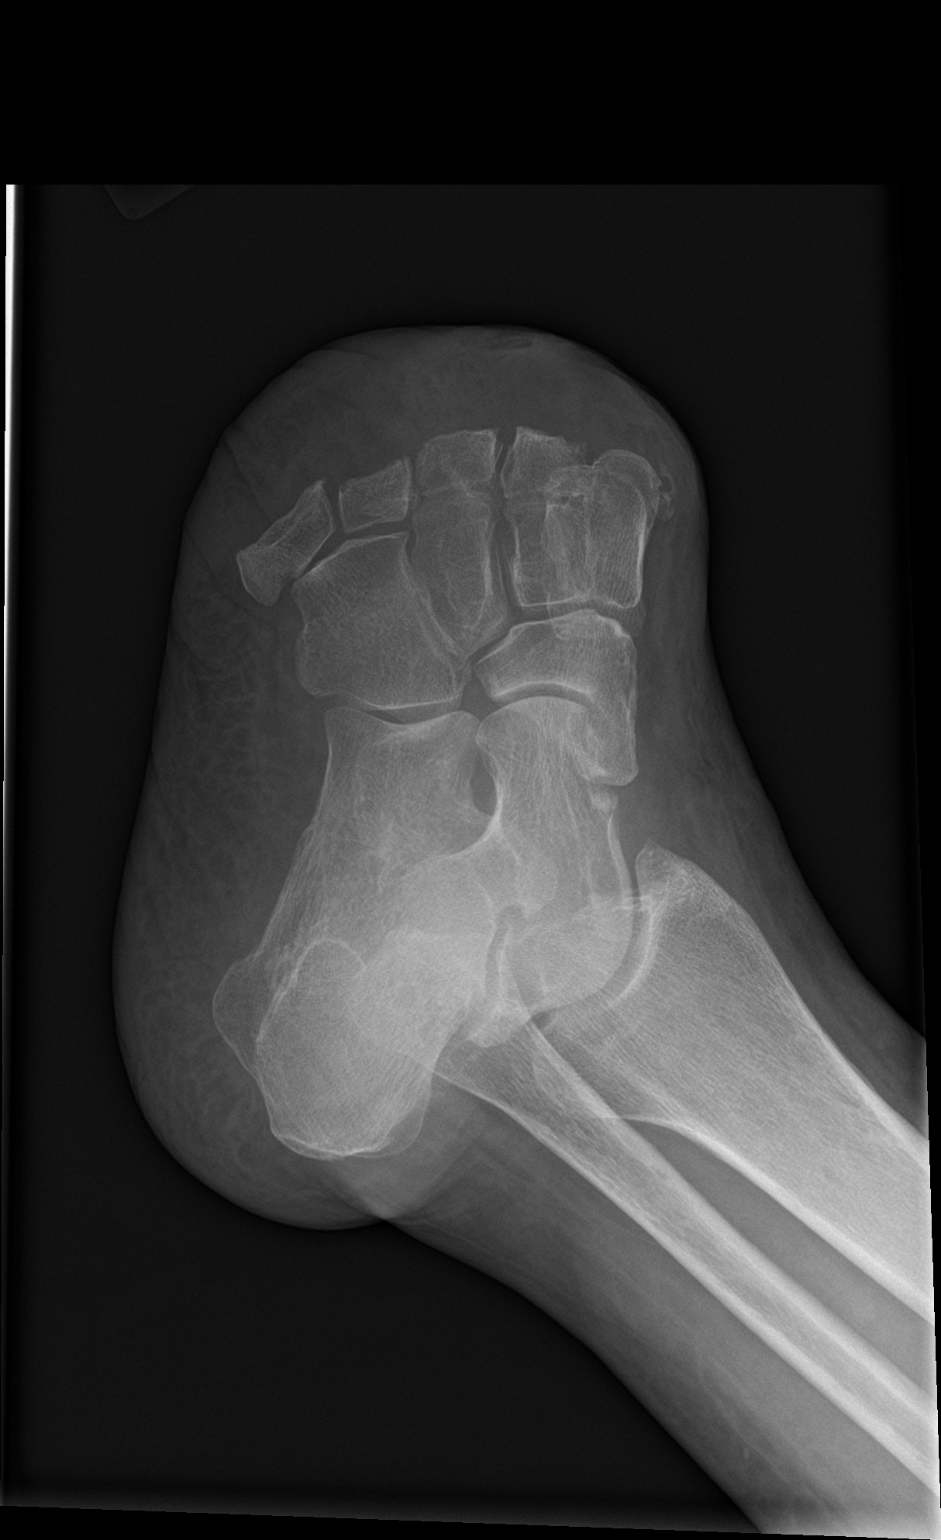

[foot lat]
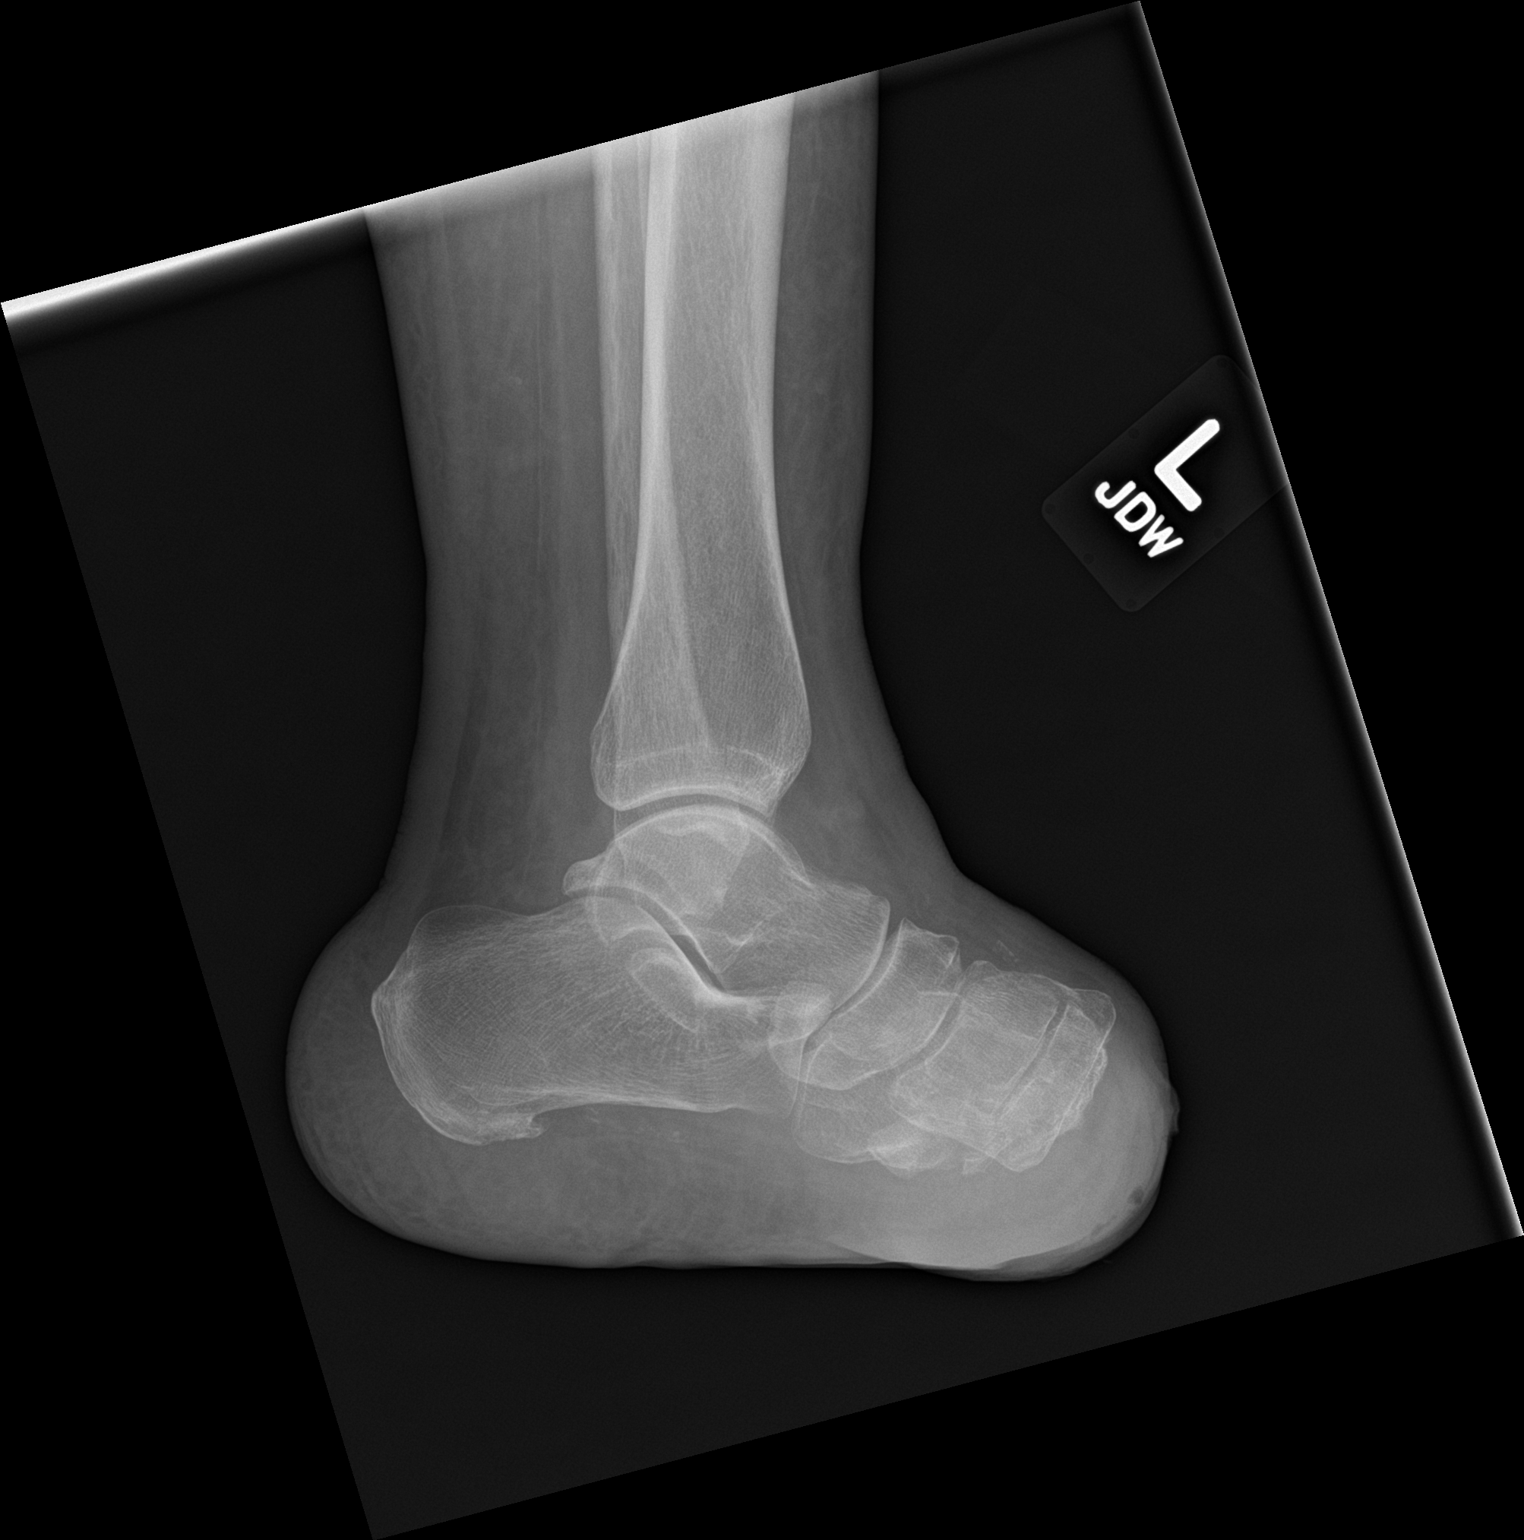

[3 of 3 positions shown; findings below may reference images not displayed]

FINDINGS: Post transmetatarsal amputation. There is air in the soft tissues
about the mid plantar aspect. No tracking soft tissue air. No
periosteal reaction or bony destructive change. No radiopaque
foreign body. Os navicular again seen.
IMPRESSION: Post transmetatarsal amputation. Air within the soft tissues of the
resection bed likely site of ulcer. No radiographic findings of
osteomyelitis or tracking soft tissue air.

## 2017-07-10 ENCOUNTER — Ambulatory Visit: Payer: Medicare Other | Attending: Neurology | Admitting: Physical Therapy

## 2017-07-16 ENCOUNTER — Encounter (HOSPITAL_COMMUNITY): Payer: Self-pay

## 2017-07-16 ENCOUNTER — Emergency Department (HOSPITAL_COMMUNITY): Payer: Medicare Other

## 2017-07-16 ENCOUNTER — Inpatient Hospital Stay (HOSPITAL_COMMUNITY)
Admission: EM | Admit: 2017-07-16 | Discharge: 2017-07-19 | DRG: 377 | Disposition: A | Discharge status: Home Health | Payer: Medicare Other | Attending: Family Medicine | Admitting: Family Medicine

## 2017-07-16 DIAGNOSIS — R195 Other fecal abnormalities: Secondary | ICD-10-CM | POA: Diagnosis not present

## 2017-07-16 DIAGNOSIS — E8889 Other specified metabolic disorders: Secondary | ICD-10-CM | POA: Diagnosis present

## 2017-07-16 DIAGNOSIS — F1420 Cocaine dependence, uncomplicated: Secondary | ICD-10-CM | POA: Diagnosis present

## 2017-07-16 DIAGNOSIS — G894 Chronic pain syndrome: Secondary | ICD-10-CM | POA: Diagnosis present

## 2017-07-16 DIAGNOSIS — Z7189 Other specified counseling: Secondary | ICD-10-CM | POA: Diagnosis not present

## 2017-07-16 DIAGNOSIS — I5042 Chronic combined systolic (congestive) and diastolic (congestive) heart failure: Secondary | ICD-10-CM | POA: Diagnosis present

## 2017-07-16 DIAGNOSIS — D62 Acute posthemorrhagic anemia: Secondary | ICD-10-CM | POA: Diagnosis present

## 2017-07-16 DIAGNOSIS — E1140 Type 2 diabetes mellitus with diabetic neuropathy, unspecified: Secondary | ICD-10-CM | POA: Diagnosis present

## 2017-07-16 DIAGNOSIS — F319 Bipolar disorder, unspecified: Secondary | ICD-10-CM | POA: Diagnosis present

## 2017-07-16 DIAGNOSIS — K921 Melena: Principal | ICD-10-CM | POA: Diagnosis present

## 2017-07-16 DIAGNOSIS — E11649 Type 2 diabetes mellitus with hypoglycemia without coma: Secondary | ICD-10-CM | POA: Diagnosis present

## 2017-07-16 DIAGNOSIS — R531 Weakness: Secondary | ICD-10-CM | POA: Diagnosis not present

## 2017-07-16 DIAGNOSIS — Z515 Encounter for palliative care: Secondary | ICD-10-CM | POA: Diagnosis present

## 2017-07-16 DIAGNOSIS — R450 Nervousness: Secondary | ICD-10-CM | POA: Diagnosis not present

## 2017-07-16 DIAGNOSIS — H409 Unspecified glaucoma: Secondary | ICD-10-CM | POA: Diagnosis present

## 2017-07-16 DIAGNOSIS — F259 Schizoaffective disorder, unspecified: Secondary | ICD-10-CM | POA: Diagnosis present

## 2017-07-16 DIAGNOSIS — Z89432 Acquired absence of left foot: Secondary | ICD-10-CM

## 2017-07-16 DIAGNOSIS — K922 Gastrointestinal hemorrhage, unspecified: Secondary | ICD-10-CM

## 2017-07-16 DIAGNOSIS — B170 Acute delta-(super) infection of hepatitis B carrier: Secondary | ICD-10-CM

## 2017-07-16 DIAGNOSIS — F419 Anxiety disorder, unspecified: Secondary | ICD-10-CM | POA: Diagnosis present

## 2017-07-16 DIAGNOSIS — R001 Bradycardia, unspecified: Secondary | ICD-10-CM | POA: Diagnosis present

## 2017-07-16 DIAGNOSIS — N189 Chronic kidney disease, unspecified: Secondary | ICD-10-CM

## 2017-07-16 DIAGNOSIS — Z531 Procedure and treatment not carried out because of patient's decision for reasons of belief and group pressure: Secondary | ICD-10-CM | POA: Diagnosis present

## 2017-07-16 DIAGNOSIS — Z794 Long term (current) use of insulin: Secondary | ICD-10-CM

## 2017-07-16 DIAGNOSIS — N2581 Secondary hyperparathyroidism of renal origin: Secondary | ICD-10-CM | POA: Diagnosis present

## 2017-07-16 DIAGNOSIS — D649 Anemia, unspecified: Secondary | ICD-10-CM | POA: Diagnosis not present

## 2017-07-16 DIAGNOSIS — Z880 Allergy status to penicillin: Secondary | ICD-10-CM

## 2017-07-16 DIAGNOSIS — Z8719 Personal history of other diseases of the digestive system: Secondary | ICD-10-CM

## 2017-07-16 DIAGNOSIS — Z8614 Personal history of Methicillin resistant Staphylococcus aureus infection: Secondary | ICD-10-CM

## 2017-07-16 DIAGNOSIS — D696 Thrombocytopenia, unspecified: Secondary | ICD-10-CM | POA: Diagnosis present

## 2017-07-16 DIAGNOSIS — F1410 Cocaine abuse, uncomplicated: Secondary | ICD-10-CM | POA: Diagnosis not present

## 2017-07-16 DIAGNOSIS — Z66 Do not resuscitate: Secondary | ICD-10-CM | POA: Diagnosis present

## 2017-07-16 DIAGNOSIS — Z888 Allergy status to other drugs, medicaments and biological substances status: Secondary | ICD-10-CM

## 2017-07-16 DIAGNOSIS — Z992 Dependence on renal dialysis: Secondary | ICD-10-CM

## 2017-07-16 DIAGNOSIS — Z008 Encounter for other general examination: Secondary | ICD-10-CM

## 2017-07-16 DIAGNOSIS — N186 End stage renal disease: Secondary | ICD-10-CM | POA: Diagnosis present

## 2017-07-16 DIAGNOSIS — Z8619 Personal history of other infectious and parasitic diseases: Secondary | ICD-10-CM

## 2017-07-16 DIAGNOSIS — I132 Hypertensive heart and chronic kidney disease with heart failure and with stage 5 chronic kidney disease, or end stage renal disease: Secondary | ICD-10-CM | POA: Diagnosis present

## 2017-07-16 DIAGNOSIS — J9601 Acute respiratory failure with hypoxia: Secondary | ICD-10-CM | POA: Diagnosis present

## 2017-07-16 DIAGNOSIS — Z9115 Patient's noncompliance with renal dialysis: Secondary | ICD-10-CM

## 2017-07-16 DIAGNOSIS — Z885 Allergy status to narcotic agent status: Secondary | ICD-10-CM

## 2017-07-16 DIAGNOSIS — F17210 Nicotine dependence, cigarettes, uncomplicated: Secondary | ICD-10-CM | POA: Diagnosis not present

## 2017-07-16 DIAGNOSIS — D631 Anemia in chronic kidney disease: Secondary | ICD-10-CM | POA: Diagnosis present

## 2017-07-16 DIAGNOSIS — Z8673 Personal history of transient ischemic attack (TIA), and cerebral infarction without residual deficits: Secondary | ICD-10-CM

## 2017-07-16 DIAGNOSIS — E875 Hyperkalemia: Secondary | ICD-10-CM | POA: Diagnosis present

## 2017-07-16 DIAGNOSIS — Z87891 Personal history of nicotine dependence: Secondary | ICD-10-CM

## 2017-07-16 DIAGNOSIS — E7800 Pure hypercholesterolemia, unspecified: Secondary | ICD-10-CM | POA: Diagnosis present

## 2017-07-16 DIAGNOSIS — Z8669 Personal history of other diseases of the nervous system and sense organs: Secondary | ICD-10-CM

## 2017-07-16 DIAGNOSIS — E1122 Type 2 diabetes mellitus with diabetic chronic kidney disease: Secondary | ICD-10-CM | POA: Diagnosis present

## 2017-07-16 DIAGNOSIS — E8770 Fluid overload, unspecified: Secondary | ICD-10-CM | POA: Diagnosis present

## 2017-07-16 DIAGNOSIS — Z79899 Other long term (current) drug therapy: Secondary | ICD-10-CM

## 2017-07-16 DIAGNOSIS — Z9119 Patient's noncompliance with other medical treatment and regimen: Secondary | ICD-10-CM

## 2017-07-16 LAB — CBC WITH DIFFERENTIAL/PLATELET
BASOS ABS: 0 10*3/uL (ref 0.0–0.1)
Basophils Relative: 0 %
EOS ABS: 0.1 10*3/uL (ref 0.0–0.7)
EOS PCT: 2 %
HCT: 14.5 % — ABNORMAL LOW (ref 36.0–46.0)
Hemoglobin: 4.6 g/dL — CL (ref 12.0–15.0)
Lymphocytes Relative: 31 %
Lymphs Abs: 1 10*3/uL (ref 0.7–4.0)
MCH: 29.5 pg (ref 26.0–34.0)
MCHC: 31.7 g/dL (ref 30.0–36.0)
MCV: 92.9 fL (ref 78.0–100.0)
Monocytes Absolute: 0.1 10*3/uL (ref 0.1–1.0)
Monocytes Relative: 4 %
Neutro Abs: 2 10*3/uL (ref 1.7–7.7)
Neutrophils Relative %: 63 %
PLATELETS: 85 10*3/uL — AB (ref 150–400)
RBC: 1.56 MIL/uL — AB (ref 3.87–5.11)
RDW: 14.5 % (ref 11.5–15.5)
WBC: 3.1 10*3/uL — AB (ref 4.0–10.5)

## 2017-07-16 LAB — COMPREHENSIVE METABOLIC PANEL
ALT: 21 U/L (ref 14–54)
AST: 27 U/L (ref 15–41)
Albumin: 3 g/dL — ABNORMAL LOW (ref 3.5–5.0)
Alkaline Phosphatase: 90 U/L (ref 38–126)
Anion gap: 24 — ABNORMAL HIGH (ref 5–15)
BUN: 111 mg/dL — AB (ref 6–20)
CALCIUM: 8.6 mg/dL — AB (ref 8.9–10.3)
CHLORIDE: 75 mmol/L — AB (ref 101–111)
CO2: 36 mmol/L — AB (ref 22–32)
Creatinine, Ser: 13.31 mg/dL — ABNORMAL HIGH (ref 0.44–1.00)
GFR calc non Af Amer: 3 mL/min — ABNORMAL LOW (ref >60)
GFR, EST AFRICAN AMERICAN: 3 mL/min — AB (ref >60)
GLUCOSE: 55 mg/dL — AB (ref 65–99)
Potassium: 6.3 mmol/L (ref 3.5–5.1)
SODIUM: 135 mmol/L (ref 135–145)
TOTAL PROTEIN: 6.5 g/dL (ref 6.5–8.1)
Total Bilirubin: 0.7 mg/dL (ref 0.3–1.2)

## 2017-07-16 LAB — I-STAT TROPONIN, ED: TROPONIN I, POC: 0.04 ng/mL (ref 0.00–0.08)

## 2017-07-16 LAB — GLUCOSE, CAPILLARY: GLUCOSE-CAPILLARY: 66 mg/dL (ref 65–99)

## 2017-07-16 LAB — CBG MONITORING, ED
Glucose-Capillary: 51 mg/dL — ABNORMAL LOW (ref 65–99)
Glucose-Capillary: 104 mg/dL — ABNORMAL HIGH (ref 65–99)

## 2017-07-16 LAB — MAGNESIUM: Magnesium: 3.9 mg/dL — ABNORMAL HIGH (ref 1.7–2.4)

## 2017-07-16 LAB — POC OCCULT BLOOD, ED: Fecal Occult Bld: POSITIVE — AB

## 2017-07-16 LAB — SAMPLE TO BLOOD BANK

## 2017-07-16 LAB — MRSA PCR SCREENING: MRSA by PCR: NEGATIVE

## 2017-07-16 MED ORDER — SODIUM CHLORIDE 0.9 % IV SOLN: 100.0000 mL | INTRAVENOUS | Status: DC | PRN

## 2017-07-16 MED ORDER — PANTOPRAZOLE SODIUM 40 MG IV SOLR
40.0000 mg | Freq: Two times a day (BID) | INTRAVENOUS | Status: DC
  Administered 2017-07-17: 40 mg via INTRAVENOUS
  Filled 2017-07-16 (×2): qty 40

## 2017-07-16 MED ORDER — LIDOCAINE-PRILOCAINE 2.5-2.5 % EX CREA: 1.0000 "application " | TOPICAL_CREAM | CUTANEOUS | Status: DC | PRN

## 2017-07-16 MED ORDER — LIDOCAINE HCL (PF) 1 % IJ SOLN: 5.0000 mL | INTRAMUSCULAR | Status: DC | PRN

## 2017-07-16 MED ORDER — ALTEPLASE 2 MG IJ SOLR: 2.0000 mg | Freq: Once | INTRAMUSCULAR | Status: DC | PRN

## 2017-07-16 MED ORDER — ALLOPURINOL 100 MG PO TABS
100.0000 mg | ORAL_TABLET | ORAL | Status: DC
  Administered 2017-07-19: 100 mg via ORAL
  Filled 2017-07-16: qty 1

## 2017-07-16 MED ORDER — ATORVASTATIN CALCIUM 40 MG PO TABS
40.0000 mg | ORAL_TABLET | Freq: Every day | ORAL | Status: DC
  Administered 2017-07-17 – 2017-07-18 (×3): 40 mg via ORAL
  Filled 2017-07-16 (×4): qty 1

## 2017-07-16 MED ORDER — DEXTROSE 50 % IV SOLN
1.0000 | Freq: Once | INTRAVENOUS | Status: AC
  Administered 2017-07-16: 50 mL via INTRAVENOUS
  Filled 2017-07-16: qty 50

## 2017-07-16 MED ORDER — ALBUTEROL SULFATE (2.5 MG/3ML) 0.083% IN NEBU
3.0000 mL | INHALATION_SOLUTION | RESPIRATORY_TRACT | Status: DC | PRN
  Administered 2017-07-17 – 2017-07-19 (×5): 3 mL via RESPIRATORY_TRACT
  Filled 2017-07-16 (×5): qty 3

## 2017-07-16 MED ORDER — ACETAMINOPHEN 650 MG RE SUPP: 650.0000 mg | Freq: Four times a day (QID) | RECTAL | Status: DC | PRN

## 2017-07-16 MED ORDER — SODIUM CHLORIDE 0.9 % IV SOLN
125.0000 mg | Freq: Every day | INTRAVENOUS | Status: DC
  Administered 2017-07-17 – 2017-07-19 (×3): 125 mg via INTRAVENOUS
  Filled 2017-07-16 (×4): qty 10

## 2017-07-16 MED ORDER — SERTRALINE HCL 50 MG PO TABS
50.0000 mg | ORAL_TABLET | Freq: Every day | ORAL | Status: DC
  Administered 2017-07-18 – 2017-07-19 (×2): 50 mg via ORAL
  Filled 2017-07-16 (×3): qty 1

## 2017-07-16 MED ORDER — HEPARIN SODIUM (PORCINE) 1000 UNIT/ML DIALYSIS: 1000.0000 [IU] | INTRAMUSCULAR | Status: DC | PRN

## 2017-07-16 MED ORDER — PENTAFLUOROPROP-TETRAFLUOROETH EX AERO: 1.0000 "application " | INHALATION_SPRAY | CUTANEOUS | Status: DC | PRN

## 2017-07-16 MED ORDER — CALCIUM ACETATE (PHOS BINDER) 667 MG PO CAPS
667.0000 mg | ORAL_CAPSULE | Freq: Three times a day (TID) | ORAL | Status: DC
  Administered 2017-07-18 – 2017-07-19 (×3): 667 mg via ORAL
  Filled 2017-07-16 (×4): qty 1

## 2017-07-16 MED ORDER — ONDANSETRON HCL 4 MG/2ML IJ SOLN: 4.0000 mg | Freq: Four times a day (QID) | INTRAMUSCULAR | Status: DC | PRN

## 2017-07-16 MED ORDER — ONDANSETRON HCL 4 MG PO TABS: 4.0000 mg | ORAL_TABLET | Freq: Four times a day (QID) | ORAL | Status: DC | PRN

## 2017-07-16 MED ORDER — NEPRO/CARBSTEADY PO LIQD
237.0000 mL | Freq: Three times a day (TID) | ORAL | Status: DC
  Administered 2017-07-16 – 2017-07-18 (×4): 237 mL via ORAL
  Filled 2017-07-16 (×12): qty 237

## 2017-07-16 MED ORDER — DARBEPOETIN ALFA 200 MCG/0.4ML IJ SOSY
200.0000 ug | PREFILLED_SYRINGE | INTRAMUSCULAR | Status: DC
  Filled 2017-07-16: qty 0.4

## 2017-07-16 MED ORDER — INSULIN ASPART 100 UNIT/ML ~~LOC~~ SOLN
0.0000 [IU] | Freq: Three times a day (TID) | SUBCUTANEOUS | Status: DC
  Administered 2017-07-18: 2 [IU] via SUBCUTANEOUS
  Administered 2017-07-18: 1 [IU] via SUBCUTANEOUS
  Administered 2017-07-18: 2 [IU] via SUBCUTANEOUS
  Administered 2017-07-19: 3 [IU] via SUBCUTANEOUS
  Administered 2017-07-19: 5 [IU] via SUBCUTANEOUS

## 2017-07-16 MED ORDER — VITAMIN A 10000 UNITS PO CAPS: 10000.0000 [IU] | ORAL_CAPSULE | Freq: Every day | ORAL | Status: DC

## 2017-07-16 MED ORDER — RENA-VITE PO TABS
1.0000 | ORAL_TABLET | Freq: Every day | ORAL | Status: DC
  Administered 2017-07-17 – 2017-07-18 (×3): 1 via ORAL
  Filled 2017-07-16 (×3): qty 1

## 2017-07-16 MED ORDER — ACETAMINOPHEN 325 MG PO TABS
650.0000 mg | ORAL_TABLET | Freq: Four times a day (QID) | ORAL | Status: DC | PRN
  Administered 2017-07-18 (×3): 650 mg via ORAL
  Filled 2017-07-16 (×3): qty 2

## 2017-07-16 MED ORDER — TRAZODONE HCL 50 MG PO TABS
25.0000 mg | ORAL_TABLET | Freq: Every day | ORAL | Status: DC
  Administered 2017-07-17 – 2017-07-18 (×2): 25 mg via ORAL
  Filled 2017-07-16 (×2): qty 1

## 2017-07-16 MED ORDER — PANTOPRAZOLE SODIUM 40 MG IV SOLR
40.0000 mg | Freq: Once | INTRAVENOUS | Status: AC
  Administered 2017-07-16: 40 mg via INTRAVENOUS
  Filled 2017-07-16: qty 40

## 2017-07-16 MED ORDER — VITAMIN C 500 MG PO TABS
500.0000 mg | ORAL_TABLET | Freq: Every day | ORAL | Status: DC
  Administered 2017-07-18 – 2017-07-19 (×2): 500 mg via ORAL
  Filled 2017-07-16 (×2): qty 1

## 2017-07-16 NOTE — Progress Notes (Signed)
Talked with son, Darcus Austin about patients condition and he doesn't seem to be understanding the seriousness of his mothers condition. Asked the charge nurse to speak with him and will put in a social work consult to facilitate a family meeting.

## 2017-07-16 NOTE — ED Notes (Signed)
CBG 51

## 2017-07-16 NOTE — Consult Note (Signed)
[facility] Renal Consultation Note    Indication for Consultation:  Management of ESRD/hemodialysis; anemia, hypertension/volume and secondary hyperparathyroidism Referring physician - ED PA;  to be admitted by Hospitalists  HPI: [name] is a 51 y.o. female with ESRD with HTN, DM, Hep C + bipolar/schizoaffective disorder on TTS HD at [facility] who last dialyzed 06/28/17.  She has been contacted several times by the charge RN and SW from her kidney center trying to persuade her to come in for dialysis.  The RN who last talked her 12/6 said that [name] said " I'm not coming back in anytime soon, no QOL.  I ma die sooner but it is ok.  I am trying some herbal supplements and it is working so far."  "it may not work for long but as of now, I will call ever day I'm not coming."  She said she had " gained some fluid but was taking something for that too."  The [facility] staff also documented phone calls on 12/10 and 12/11 but she did not answer the phone so they left messages on voice mail.  She presented to the ED today with weakness and bleeding in the form of dark clots in her stool.  She reportedly has taken herbal supplements, multiple different kinds of vitamins, Mg, activated charcoal and black charcoal to treat her ESRD.  Evaluation in the ED showed BP 121/66 P 55 R 18 100% sat WBC 3.1 hgb 4.6 plts 85 K 6.3 BUN 111 Cr 13 CO2 36 glu 55 alb 3 LFT ok Ca 8.6 CXR showed ^ vascular congestion, CM bibasilar infiltrates L > R.   She called EMS herself this am and is now willing to resume dialysis. She still makes urine. She is SOB and weak and wants pain medicine. She is hungry and wants graham crackers because she hasn't eaten much.  She has been taking her BP medications (clonidine and norvasc in her bag.) She is requesting IV pain meds.   Past Medical History:  Diagnosis Date  . Anemia   . Anxiety   . Bipolar disorder, unspecified ([place]) 02/06/2014  . Chronic combined systolic and diastolic CHF  (congestive heart failure) (HCC)    EF 40% by echo and 48% by stress test  . Chronic pain syndrome   . Depression   . ESRD on hemodialysis ([place])   . GERD (gastroesophageal reflux disease)   . Glaucoma   . Headache    sinus headaches  . Hepatitis C   . High cholesterol   . History of hiatal hernia   . History of vitamin D deficiency 06/20/2015  . Hypertension   . MRSA infection ?2014   "on the back of my head; spread throughout my bloodstream"  . Neuropathy   . Refusal of blood transfusions as patient is Jehovah's Witness   . Schizoaffective disorder, unspecified condition 02/08/2014  . Seizure [facility]) dx'd 2014   "don't know what kind; last one was ~ 04/2014"  . Stroke [facility])    TIA 2009  . TIA (transient ischemic attack) 2010  . Type II diabetes mellitus ([place])    INSULIN DEPENDENT   Past Surgical History:  Procedure Laterality Date  . ABDOMINAL HYSTERECTOMY  2014  . ABDOMINAL HYSTERECTOMY  2010  . AMPUTATION Left 05/21/2013   Procedure: Left Midfoot AMPUTATION;  Surgeon: Newt Minion, MD;  Location: [place];  Service: Orthopedics;  Laterality: Left;  Left Midfoot Amputation  . AV FISTULA PLACEMENT Left 05/04/2015  Procedure: ARTERIOVENOUS (AV) GRAFT INSERTION;  Surgeon: Angelia Mould, MD;  Location: [place];  Service: Vascular;  Laterality: Left;  . COLONOSCOPY WITH PROPOFOL N/A 06/22/2013   Procedure: COLONOSCOPY WITH PROPOFOL;  Surgeon: Jeryl Columbia, MD;  Location: [facility] ENDOSCOPY;  Service: Endoscopy;  Laterality: N/A;  . ESOPHAGOGASTRODUODENOSCOPY N/A 05/11/2015   Procedure: ESOPHAGOGASTRODUODENOSCOPY (EGD);  Surgeon: Laurence Spates, MD;  Location: [facility] ENDOSCOPY;  Service: Endoscopy;  Laterality: N/A;  . EYE SURGERY Bilateral 2010   Lasik  . FLEXIBLE SIGMOIDOSCOPY N/A 01/17/2017   Procedure: FLEXIBLE SIGMOIDOSCOPY w/ hemorrhoid banding possible;  Surgeon: Gatha Mayer, MD;  Location: [facility] ENDOSCOPY;  Service: Endoscopy;  Laterality: N/A;  . FOOT AMPUTATION Left    due  diabetic neuropathy, could not feel ulcer on bottom of foot  . REFRACTIVE SURGERY Bilateral 2013  . TONSILLECTOMY  1970's  . TUBAL LIGATION  2000   Family History  Problem Relation Age of Onset  . Hypertension Mother   . Diabetes Mother   . Hypertension Father   . Diabetes Father    Social History:  reports that she quit smoking about 2 years ago. Her smoking use included cigarettes. She has a 20.00 pack-year smoking history. she has never used smokeless tobacco. She reports that she drinks about 1.8 oz of alcohol per week. She reports that she uses drugs. Drugs: "Crack" cocaine and Cocaine. Allergies  Allergen Reactions  . Penicillins Hives and Other (See Comments)    Has patient had a PCN reaction causing immediate rash, facial/tongue/throat swelling, SOB or lightheadedness with hypotension: Yes Has patient had a PCN reaction causing severe rash involving mucus membranes or skin necrosis: No Has patient had a PCN reaction that required hospitalization No Has patient had a PCN reaction occurring within the last 10 years: No If all of the above answers are "NO", then may proceed with Cephalosporin use. Pt tolerates cefepime and ceftriaxone  . Gabapentin Itching and Swelling  . Lyrica [Pregabalin] Swelling and Other (See Comments)    Facial and leg swelling  . Saphris [Asenapine] Swelling and Other (See Comments)    Facial swelling  . Tramadol Swelling and Other (See Comments)    Leg swelling  . Other Other (See Comments)    NO BLOOD PRODUCTS. PT IS A JEHOVAH WITNESS   Prior to Admission medications   Medication Sig Start Date End Date Taking? Authorizing Provider  albuterol (PROVENTIL HFA;VENTOLIN HFA) 108 (90 Base) MCG/ACT inhaler Inhale 2 puffs into the lungs every 4 (four) hours as needed for wheezing or shortness of breath. 10/15/16   Merryl Hacker, MD  allopurinol (ZYLOPRIM) 100 MG tablet Take 1 tablet (100 mg total) by mouth every Tuesday, Thursday, and Saturday at 6 PM.  01/25/17   Nita Sells, MD  amLODipine (NORVASC) 10 MG tablet Take 1 tablet (10 mg total) by mouth at bedtime. 09/02/16   Hildred Priest, MD  atorvastatin (LIPITOR) 40 MG tablet Take 1 tablet (40 mg total) by mouth at bedtime. 09/02/16   Hildred Priest, MD  blood glucose meter kit and supplies KIT Dispense based on patient and insurance preference. Use up to four times daily as directed. (FOR ICD-9 250.00, 250.01). 06/07/17   Ina Homes, MD  calcium acetate (PHOSLO) 667 MG capsule Take 2 capsules (1,334 mg total) by mouth 3 (three) times daily with meals. Patient taking differently: Take 667 mg by mouth 3 (three) times daily with meals.  09/02/16   Hildred Priest, MD  carvedilol (COREG) 25 MG tablet Take  1 tablet (25 mg total) by mouth 2 (two) times daily with a meal. 09/02/16   Hildred Priest, MD  cloNIDine (CATAPRES) 0.1 MG tablet Take 1 tablet (0.1 mg total) by mouth every 8 (eight) hours. 09/02/16   Hildred Priest, MD  diphenhydrAMINE (BENADRYL) 25 mg capsule Take 25 mg by mouth every 6 (six) hours as needed for itching. Patient is given one capsule on dialysis days    [provider]  Ferrous Sulfate (SLOW FE PO) Take 60 tablets by mouth daily.    [provider]  furosemide (LASIX) 40 MG tablet Take 40 mg by mouth daily. 05/27/17   [provider]  haloperidol (HALDOL) 0.5 MG tablet Take 1 tablet (0.5 mg total) by mouth every 8 (eight) hours as needed for agitation. Patient not taking: Reported on 06/03/2017 09/03/16   Hildred Priest, MD  hydrocortisone (ANUSOL-HC) 2.5 % rectal cream Place rectally 2 (two) times daily. 01/24/17   Nita Sells, MD  insulin aspart (NOVOLOG) 100 UNIT/ML injection Inject 7 Units into the skin 3 (three) times daily with meals. 06/07/17   Ina Homes, MD  insulin glargine (LANTUS) 100 UNIT/ML injection Inject 0.2 mLs (20 Units total) into the skin at  bedtime. 06/07/17   Ina Homes, MD  LORazepam (ATIVAN) 2 MG/ML injection Inject 1 mL (2 mg total) into the vein Every Tuesday,Thursday,and Saturday with dialysis. Patient not taking: Reported on 06/03/2017 09/03/16   Hildred Priest, MD  multivitamin (RENA-VIT) TABS tablet Take 1 tablet by mouth at bedtime. 09/02/16   Hildred Priest, MD  mupirocin ointment (BACTROBAN) 2 % Apply 1 application topically daily. 03/06/17   [provider]  Nutritional Supplements (FEEDING SUPPLEMENT, NEPRO CARB STEADY,) LIQD Take 237 mLs by mouth 3 (three) times daily between meals. 02/28/17   Mikhail, Velta Addison, DO  Oxycodone HCl 10 MG TABS Take 1 tablet (10 mg total) by mouth every 6 (six) hours as needed. Patient not taking: Reported on 06/03/2017 02/28/17   Cristal Ford, DO  sertraline (ZOLOFT) 50 MG tablet Take 1 tablet (50 mg total) by mouth daily. 09/02/16   Hildred Priest, MD  traZODone (DESYREL) 50 MG tablet Take 0.5 tablets (25 mg total) by mouth at bedtime. 09/02/16   Hildred Priest, MD  vitamin A 10000 UNIT capsule Take 10,000 Units by mouth daily.    [provider]  vitamin C (ASCORBIC ACID) 500 MG tablet Take 500 mg by mouth daily.    [provider]   No current facility-administered medications for this encounter.    Current Outpatient Medications  Medication Sig Dispense Refill  . albuterol (PROVENTIL HFA;VENTOLIN HFA) 108 (90 Base) MCG/ACT inhaler Inhale 2 puffs into the lungs every 4 (four) hours as needed for wheezing or shortness of breath. 1 Inhaler 0  . allopurinol (ZYLOPRIM) 100 MG tablet Take 1 tablet (100 mg total) by mouth every Tuesday, Thursday, and Saturday at 6 PM. 60 tablet 1  . amLODipine (NORVASC) 10 MG tablet Take 1 tablet (10 mg total) by mouth at bedtime. 30 tablet 0  . atorvastatin (LIPITOR) 40 MG tablet Take 1 tablet (40 mg total) by mouth at bedtime. 30 tablet 0  . blood glucose meter kit and supplies KIT  Dispense based on patient and insurance preference. Use up to four times daily as directed. (FOR ICD-9 250.00, 250.01). 1 each 0  . calcium acetate (PHOSLO) 667 MG capsule Take 2 capsules (1,334 mg total) by mouth 3 (three) times daily with meals. (Patient taking differently: Take 667 mg  by mouth 3 (three) times daily with meals. ) 240 capsule 0  . carvedilol (COREG) 25 MG tablet Take 1 tablet (25 mg total) by mouth 2 (two) times daily with a meal. 60 tablet 0  . cloNIDine (CATAPRES) 0.1 MG tablet Take 1 tablet (0.1 mg total) by mouth every 8 (eight) hours. 90 tablet 11  . diphenhydrAMINE (BENADRYL) 25 mg capsule Take 25 mg by mouth every 6 (six) hours as needed for itching. Patient is given one capsule on dialysis days    . Ferrous Sulfate (SLOW FE PO) Take 60 tablets by mouth daily.    . furosemide (LASIX) 40 MG tablet Take 40 mg by mouth daily.    . haloperidol (HALDOL) 0.5 MG tablet Take 1 tablet (0.5 mg total) by mouth every 8 (eight) hours as needed for agitation. (Patient not taking: Reported on 06/03/2017) 30 tablet 0  . hydrocortisone (ANUSOL-HC) 2.5 % rectal cream Place rectally 2 (two) times daily. 30 g 0  . insulin aspart (NOVOLOG) 100 UNIT/ML injection Inject 7 Units into the skin 3 (three) times daily with meals. 5 mL 0  . insulin glargine (LANTUS) 100 UNIT/ML injection Inject 0.2 mLs (20 Units total) into the skin at bedtime. 7 mL 0  . LORazepam (ATIVAN) 2 MG/ML injection Inject 1 mL (2 mg total) into the vein Every Tuesday,Thursday,and Saturday with dialysis. (Patient not taking: Reported on 06/03/2017) 13 mL 0  . multivitamin (RENA-VIT) TABS tablet Take 1 tablet by mouth at bedtime. 30 tablet 0  . mupirocin ointment (BACTROBAN) 2 % Apply 1 application topically daily.  0  . Nutritional Supplements (FEEDING SUPPLEMENT, NEPRO CARB STEADY,) LIQD Take 237 mLs by mouth 3 (three) times daily between meals. 90 Can 0  . Oxycodone HCl 10 MG TABS Take 1 tablet (10 mg total) by mouth every 6  (six) hours as needed. (Patient not taking: Reported on 06/03/2017) 10 tablet 0  . sertraline (ZOLOFT) 50 MG tablet Take 1 tablet (50 mg total) by mouth daily. 30 tablet 0  . traZODone (DESYREL) 50 MG tablet Take 0.5 tablets (25 mg total) by mouth at bedtime. 30 tablet 0  . vitamin A 10000 UNIT capsule Take 10,000 Units by mouth daily.    . vitamin C (ASCORBIC ACID) 500 MG tablet Take 500 mg by mouth daily.     Labs: Basic Metabolic Panel: Recent Labs  Lab 07/16/17 1338  NA 135  K 6.3*  CL 75*  CO2 36*  GLUCOSE 55*  BUN 111*  CREATININE 13.31*  CALCIUM 8.6*   Liver Function Tests: Recent Labs  Lab 07/16/17 1338  AST 27  ALT 21  ALKPHOS 90  BILITOT 0.7  PROT 6.5  ALBUMIN 3.0*   CBC: Recent Labs  Lab 07/16/17 1338  WBC 3.1*  NEUTROABS 2.0  HGB 4.6*  HCT 14.5*  MCV 92.9  PLT 85*   CBG: Recent Labs  Lab 07/16/17 1327 07/16/17 1414  GLUCAP 51* 104*   Studies/Results: Dg Chest Port 1 View  Result Date: 07/16/2017 CLINICAL DATA:  Weakness and GI bleeding EXAM: PORTABLE CHEST 1 VIEW COMPARISON:  06/03/2017 FINDINGS: Cardiac shadow is enlarged but stable. Increased vascular congestion is noted. Bibasilar infiltrates are seen with associated effusions. No bony abnormality is noted. IMPRESSION: Increased vascular congestion. Bibasilar infiltrates left greater than right. Electronically Signed   By: Inez Catalina M.D.   On: 07/16/2017 15:08    ROS: As per HPI otherwise negative.  Physical Exam: BP 135/67 P 45 OS sat 99  R 11 no Temp recorded General: edematous ill appearing AAF Head: NCAT sclera not icteric MMM Neck: Supple.  Lungs: bilateral rales Heart: bradycardic regular Abdomen: soft generalized tenderness+ BS Lower extremities: 3+  edema left transmet with chronic ulcer on base of foot- slight drainage - no over infection  Neuro:  Oriented , somewhat sluggish though recognizes me by name Dialysis Access:l eft lower AVGG + bruit  Dialysis Orders:  TTS [facility]  3.5 hours 400/800 2 K 2.25 Ca EDW 63 no heparin Hectorol 1 Mircera 225 - last given 11/19 left lower AVGG  Recent labs: hgb 8.2 11/24 25% sat 10/26 ferritin 1164 iPTH 243 Last HD 11/24- RN talked with her 12/6 and she said she wasn't coming back in anytime soon due to no QOL.  Trying herbal supplement which are working so far"   Assessment/Plan: 1. Uremia with hyperkalemia- serial gentle HD with progressive increase in BFR/DFR due to the fact it has been almost 3 weeks since she has had a HD treatment and want to avoid disequilibrium 2. SOB due to volume excess - due to missed HD; will need serial HD to remove volume 3. ESRD - TTS - daily HD for now then transition back to TTS 4. Hypertension/volume  - volume up BP ok - would hold BP meds until we get volume off and heart rate  up 5. Anemia  - hgb 4.6- heme + no heparin HD - check Fe studies - replete - dose max ESA - missed last dose - JW status (though has accepted blood in the past); hx prior GIB/hemorrhoids; Dr. Jonnie Finner explained to her that she is at higher risk of having problems when doing dialysis with a low hgb; she is accepting of the risk - GI is seeing.  Would not treat ^ K with kayexalate due to passing clots via stool and wouldn't want to exacerbate chance of bleeding. 6. Metabolic bone disease -  Continue prior hectorol dose TTS- not clear what binder she really is on - has calcium acetate  And renvela on outpatient med list - she doesn't know; has Ca + D in her bag (which we would not Rx). She states the activated charcoal was to help with this too. 7. Nutrition - renal diet/vitamin 8. Bipolar/schizoaffective disorder 9. Hep C + 10. Chronic pain syndrome 11. Thrombocytopenia - usual plts are are > 150 - though have been noted to be in the low 100s at times with outpatient monthly labs  12. Hx substance abuse 13. DM with hypoglycemia - per primary; suspect she hasn't eaten like usual.  Myriam Jacobson, PA-C [place] [phone] 07/16/2017, 3:27 PM   Pt seen, examined and agree w A/P as above. ESRD pt missed HD last 2-3 wks, is very sick with severe anemia, vol overload and hyperkalemia and won't accept blood transfusions.  She needs HD but with the caveat that there is higher risk for dialysis complications with her severe anemia.  Have d/w patient.  Plan as above.  Kelly Splinter MD [employer] pager [phone]   07/16/2017, 4:47 PM

## 2017-07-16 NOTE — ED Provider Notes (Signed)
[facility] Provider Note   CSN: [id number] Arrival date & time: 07/16/17  1331     History   Chief Complaint Chief Complaint  Patient presents with  . Weakness  . GI Bleeding    HPI [name] is a 51 y.o. female with PMH/o DM, ESRD, CHF BIB EMS for evaluation of generalized weakness, patient called EMS today because she felt overall fatigued and weak.  Patient also to the EMS that for the last 3 days, she has noted dark clots in her stool.  Patient reports that for the last 2 weeks she is not going to her dialysis appointment.  Patient states that she is a Tuesday, Thursday and Saturday dialysis patient.  Patient states that she stopped going because she did not want to go anymore.  She states that she was trying to do some over-the-counter herbal supplements to help with the kidney failure.  Patient states that she has taken activated charcoal, black charcoal and other various herbs to help with the symptoms.  Patient also reports of the last few days she has had bilateral lower extremity edema and diffuse chest pain.  Patient denies any fevers, abdominal pain.    The history is provided by the patient.    Past Medical History:  Diagnosis Date  . Anemia   . Anxiety   . Bipolar disorder, unspecified ([place]) 02/06/2014  . Chronic combined systolic and diastolic CHF (congestive heart failure) (HCC)    EF 40% by echo and 48% by stress test  . Chronic pain syndrome   . Depression   . ESRD on hemodialysis ([place])   . GERD (gastroesophageal reflux disease)   . Glaucoma   . Headache    sinus headaches  . Hepatitis C   . High cholesterol   . History of hiatal hernia   . History of vitamin D deficiency 06/20/2015  . Hypertension   . MRSA infection ?2014   "on the back of my head; spread throughout my bloodstream"  . Neuropathy   . Refusal of blood transfusions as patient is Jehovah's Witness   . Schizoaffective disorder, unspecified condition  02/08/2014  . Seizure [facility]) dx'd 2014   "don't know what kind; last one was ~ 04/2014"  . Stroke [facility])    TIA 2009  . TIA (transient ischemic attack) 2010  . Type II diabetes mellitus ([place])    INSULIN DEPENDENT    Patient Active Problem List   Diagnosis Date Noted  . Volume overload 06/03/2017  . Hyperglycemia 06/03/2017  . Right supracondylar humerus fracture, with nonunion, subsequent encounter 05/01/2017  . Malnutrition of moderate degree 02/28/2017  . Leg pain, left 02/25/2017  . Left leg pain 02/25/2017  . External hemorrhoids   . Diverticulosis of colon without hemorrhage   . Goals of care, counseling/discussion   . Palliative care encounter   . Cellulitis of left leg 01/16/2017  . Diabetes mellitus with complication ([place]) 18/56/3149  . Upper GI bleed 01/16/2017  . UGIB (upper gastrointestinal bleed)   . Anemia due to chronic kidney disease   . Cellulitis of left thigh 01/01/2017  . ESRD (end stage renal disease) ([place]) 01/01/2017  . Cocaine use disorder, severe, dependence ([place]) 08/29/2016  . Hepatitis C 08/29/2016  . Diabetic ulcer of calf ([place]) 08/29/2016  . Diabetic ulcer of toe ([place]) 08/29/2016  . Cerebrovascular accident (CVA) ([place]) 08/29/2016  . MDD (major depressive disorder) 08/28/2016  . Dyslipidemia associated with type 2 diabetes mellitus ([place])  11/10/2015  . Hyperkalemia 09/17/2015  . History of vitamin D deficiency 06/20/2015  . Long term prescription opiate use 06/20/2015  . Chronic pain syndrome 06/19/2015  . Anemia of chronic kidney failure 05/01/2015  . transmetatarsal amputation of the left foot 12/13/2014  . Unilateral visual loss 11/22/2014  . 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 ([place]) 11/22/2014  . Essential hypertension 11/09/2014  . Dizziness   . Chronic combined systolic (congestive) and diastolic (congestive) heart failure ([place])   . Rectal bleeding 04/23/2014  . Anemia, iron deficiency 04/13/2014  . Noncompliance 02/02/2014  .  Hyponatremia 05/19/2013  . CKD (chronic kidney disease) stage 5, GFR less than 15 ml/min (HCC) 05/18/2013  . Seizure disorder ([place]) 02/09/2013    Past Surgical History:  Procedure Laterality Date  . ABDOMINAL HYSTERECTOMY  2014  . ABDOMINAL HYSTERECTOMY  2010  . AMPUTATION Left 05/21/2013   Procedure: Left Midfoot AMPUTATION;  Surgeon: Newt Minion, MD;  Location: [facility];  Service: Orthopedics;  Laterality: Left;  Left Midfoot Amputation  . AV FISTULA PLACEMENT Left 05/04/2015   Procedure: ARTERIOVENOUS (AV) GRAFT INSERTION;  Surgeon: Angelia Mould, MD;  Location: [facility];  Service: Vascular;  Laterality: Left;  . COLONOSCOPY WITH PROPOFOL N/A 06/22/2013   Procedure: COLONOSCOPY WITH PROPOFOL;  Surgeon: Jeryl Columbia, MD;  Location: [facility] ENDOSCOPY;  Service: Endoscopy;  Laterality: N/A;  . ESOPHAGOGASTRODUODENOSCOPY N/A 05/11/2015   Procedure: ESOPHAGOGASTRODUODENOSCOPY (EGD);  Surgeon: Laurence Spates, MD;  Location[facility] ENDOSCOPY;  Service: Endoscopy;  Laterality: N/A;  . EYE SURGERY Bilateral 2010   Lasik  . FLEXIBLE SIGMOIDOSCOPY N/A 01/17/2017   Procedure: FLEXIBLE SIGMOIDOSCOPY w/ hemorrhoid banding possible;  Surgeon: Gatha Mayer, MD;  Location: [facility] ENDOSCOPY;  Service: Endoscopy;  Laterality: N/A;  . FOOT AMPUTATION Left    due diabetic neuropathy, could not feel ulcer on bottom of foot  . REFRACTIVE SURGERY Bilateral 2013  . TONSILLECTOMY  1970's  . TUBAL LIGATION  2000    OB History    Gravida Para Term Preterm AB Living   0 0 0 0 0     SAB TAB Ectopic Multiple Live Births   0 0 0           Home Medications    Prior to Admission medications   Medication Sig Start Date End Date Taking? Authorizing Provider  albuterol (PROVENTIL HFA;VENTOLIN HFA) 108 (90 Base) MCG/ACT inhaler Inhale 2 puffs into the lungs every 4 (four) hours as needed for wheezing or shortness of breath. 10/15/16   Merryl Hacker, MD  allopurinol (ZYLOPRIM) 100 MG tablet Take 1 tablet (100 mg  total) by mouth every Tuesday, Thursday, and Saturday at 6 PM. 01/25/17   Nita Sells, MD  amLODipine (NORVASC) 10 MG tablet Take 1 tablet (10 mg total) by mouth at bedtime. 09/02/16   Hildred Priest, MD  atorvastatin (LIPITOR) 40 MG tablet Take 1 tablet (40 mg total) by mouth at bedtime. 09/02/16   Hildred Priest, MD  blood glucose meter kit and supplies KIT Dispense based on patient and insurance preference. Use up to four times daily as directed. (FOR ICD-9 250.00, 250.01). 06/07/17   Ina Homes, MD  calcium acetate (PHOSLO) 667 MG capsule Take 2 capsules (1,334 mg total) by mouth 3 (three) times daily with meals. Patient taking differently: Take 667 mg by mouth 3 (three) times daily with meals.  09/02/16   Hildred Priest, MD  carvedilol (COREG) 25 MG tablet Take 1 tablet (25 mg total) by mouth  2 (two) times daily with a meal. 09/02/16   Hildred Priest, MD  cloNIDine (CATAPRES) 0.1 MG tablet Take 1 tablet (0.1 mg total) by mouth every 8 (eight) hours. 09/02/16   Hildred Priest, MD  diphenhydrAMINE (BENADRYL) 25 mg capsule Take 25 mg by mouth every 6 (six) hours as needed for itching. Patient is given one capsule on dialysis days    [provider]  Ferrous Sulfate (SLOW FE PO) Take 60 tablets by mouth daily.    [provider]  furosemide (LASIX) 40 MG tablet Take 40 mg by mouth daily. 05/27/17   [provider]  haloperidol (HALDOL) 0.5 MG tablet Take 1 tablet (0.5 mg total) by mouth every 8 (eight) hours as needed for agitation. Patient not taking: Reported on 06/03/2017 09/03/16   Hildred Priest, MD  hydrocortisone (ANUSOL-HC) 2.5 % rectal cream Place rectally 2 (two) times daily. 01/24/17   Nita Sells, MD  insulin aspart (NOVOLOG) 100 UNIT/ML injection Inject 7 Units into the skin 3 (three) times daily with meals. 06/07/17   Ina Homes, MD  insulin glargine (LANTUS) 100 UNIT/ML  injection Inject 0.2 mLs (20 Units total) into the skin at bedtime. 06/07/17   Ina Homes, MD  LORazepam (ATIVAN) 2 MG/ML injection Inject 1 mL (2 mg total) into the vein Every Tuesday,Thursday,and Saturday with dialysis. Patient not taking: Reported on 06/03/2017 09/03/16   Hildred Priest, MD  multivitamin (RENA-VIT) TABS tablet Take 1 tablet by mouth at bedtime. 09/02/16   Hildred Priest, MD  mupirocin ointment (BACTROBAN) 2 % Apply 1 application topically daily. 03/06/17   [provider]  Nutritional Supplements (FEEDING SUPPLEMENT, NEPRO CARB STEADY,) LIQD Take 237 mLs by mouth 3 (three) times daily between meals. 02/28/17   Mikhail, Velta Addison, DO  Oxycodone HCl 10 MG TABS Take 1 tablet (10 mg total) by mouth every 6 (six) hours as needed. Patient not taking: Reported on 06/03/2017 02/28/17   Cristal Ford, DO  sertraline (ZOLOFT) 50 MG tablet Take 1 tablet (50 mg total) by mouth daily. 09/02/16   Hildred Priest, MD  traZODone (DESYREL) 50 MG tablet Take 0.5 tablets (25 mg total) by mouth at bedtime. 09/02/16   Hildred Priest, MD  vitamin A 10000 UNIT capsule Take 10,000 Units by mouth daily.    [provider]  vitamin C (ASCORBIC ACID) 500 MG tablet Take 500 mg by mouth daily.    [provider]    Family History Family History  Problem Relation Age of Onset  . Hypertension Mother   . Diabetes Mother   . Hypertension Father   . Diabetes Father     Social History Social History   Tobacco Use  . Smoking status: Former Smoker    Packs/day: 1.00    Years: 20.00    Pack years: 20.00    Types: Cigarettes    Last attempt to quit: 03/31/2015    Years since quitting: 2.2  . Smokeless tobacco: Never Used  . Tobacco comment: "stopped smoking in ~ 2013; restarted last week; stopped again 06/17/2014"  Substance Use Topics  . Alcohol use: Yes    Alcohol/week: 1.8 oz    Types: 3 Standard drinks or equivalent per  week    Comment: 05/01/15 patient denies alcohol use.  . Drug use: Yes    Types: "Crack" cocaine, Cocaine    Comment: pt reports last use June 2018     Allergies   Penicillins; Gabapentin; Lyrica [pregabalin]; Saphris [asenapine]; Tramadol; and Other   Review of  Systems Review of Systems  Constitutional: Positive for fatigue. Negative for chills and fever.  HENT: Negative for congestion.   Eyes: Negative for visual disturbance.  Respiratory: Negative for cough and shortness of breath.   Cardiovascular: Positive for chest pain and leg swelling.  Gastrointestinal: Positive for blood in stool. Negative for abdominal pain, diarrhea, nausea and vomiting.  Genitourinary: Negative for dysuria and hematuria.  Musculoskeletal: Negative for back pain and neck pain.  Neurological: Positive for weakness. Negative for dizziness, numbness and headaches.     Physical Exam Updated Vital Signs BP 135/67   Pulse (!) 45   Resp 11   LMP  (LMP Unknown)   SpO2 99%   Physical Exam  Constitutional: She is oriented to person, place, and time. She appears well-developed and well-nourished. She appears ill.  HENT:  Head: Normocephalic and atraumatic.  Mouth/Throat: Oropharynx is clear and moist and mucous membranes are normal.  Eyes: Conjunctivae, EOM and lids are normal. Pupils are equal, round, and reactive to light.  Neck: Full passive range of motion without pain.  Cardiovascular: Normal rate, regular rhythm, normal heart sounds and normal pulses. Exam reveals no gallop and no friction rub.  No murmur heard. Pulmonary/Chest: Effort normal. No respiratory distress. She has no wheezes. She has rales.  Abdominal: Soft. Normal appearance. There is generalized tenderness. There is no rigidity and no guarding.  Genitourinary: Rectal exam shows external hemorrhoid and guaiac positive stool. Rectal exam shows no mass.  Genitourinary Comments: The exam was performed with a chaperone present. Dark stool  noted. No active bleeding.   Musculoskeletal: Normal range of motion.  2+ pitting edema to BLE that beginning at the mid calf region that extends distally to the feet. Partially amputated right foot.  No open wounds, ulcers, abrasions noted.  Neurological: She is alert and oriented to person, place, and time.  Alert and responsive.  Easily answers questions and follows commands.  Skin: Skin is warm and dry. Capillary refill takes less than 2 seconds.  Ecchymosis noted to the anterior abdomen  Psychiatric: She has a normal mood and affect. Her speech is normal.  Nursing note and vitals reviewed.    ED Treatments / Results  Labs (all labs ordered are listed, but only abnormal results are displayed) Labs Reviewed  CBC WITH DIFFERENTIAL/PLATELET - Abnormal; Notable for the following components:      Result Value   WBC 3.1 (*)    RBC 1.56 (*)    Hemoglobin 4.6 (*)    HCT 14.5 (*)    Platelets 85 (*)    All other components within normal limits  COMPREHENSIVE METABOLIC PANEL - Abnormal; Notable for the following components:   Potassium 6.3 (*)    Chloride 75 (*)    CO2 36 (*)    Glucose, Bld 55 (*)    BUN 111 (*)    Creatinine, Ser 13.31 (*)    Calcium 8.6 (*)    Albumin 3.0 (*)    GFR calc non Af Amer 3 (*)    GFR calc Af Amer 3 (*)    Anion gap 24 (*)    All other components within normal limits  POC OCCULT BLOOD, ED - Abnormal; Notable for the following components:   Fecal Occult Bld POSITIVE (*)    All other components within normal limits  CBG MONITORING, ED - Abnormal; Notable for the following components:   Glucose-Capillary 51 (*)    All other components within normal limits  CBG MONITORING, ED -  Abnormal; Notable for the following components:   Glucose-Capillary 104 (*)    All other components within normal limits  RAPID URINE DRUG SCREEN, HOSP PERFORMED  I-STAT TROPONIN, ED  I-STAT CHEM 8, ED  SAMPLE TO BLOOD BANK    EKG  EKG Interpretation None        Radiology Dg Chest Port 1 View  Result Date: 07/16/2017 CLINICAL DATA:  Weakness and GI bleeding EXAM: PORTABLE CHEST 1 VIEW COMPARISON:  06/03/2017 FINDINGS: Cardiac shadow is enlarged but stable. Increased vascular congestion is noted. Bibasilar infiltrates are seen with associated effusions. No bony abnormality is noted. IMPRESSION: Increased vascular congestion. Bibasilar infiltrates left greater than right. Electronically Signed   By: Inez Catalina M.D.   On: 07/16/2017 15:08    Procedures Procedures (including critical care time)  Medications Ordered in ED Medications  ferric gluconate (NULECIT) 125 mg in sodium chloride 0.9 % 100 mL IVPB (not administered)  Darbepoetin Alfa (ARANESP) injection 200 mcg (not administered)  dextrose 50 % solution 50 mL (50 mLs Intravenous Given 07/16/17 1339)  pantoprazole (PROTONIX) injection 40 mg (40 mg Intravenous Given 07/16/17 1441)     Initial Impression / Assessment and Plan / ED Course  I have reviewed the triage vital signs and the nursing notes.  Pertinent labs & imaging results that were available during my care of the patient were reviewed by me and considered in my medical decision making (see chart for details).     51 y.o. F past medical history of end-stage renal disease, diabetes, CHF who presents for evaluation of generalized weakness.  Patient also reporting some diffuse chest pain, bilateral lower extremity edema.  Patient states that she is normally a Tuesday, Thursday, Saturday dialysis patient but is not gone over the last 2 weeks because she did not feel like going anymore.  Patient states that she has taken several over-the-counter supplements to help with her kidney failure, including activated charcoal and red charcoal.  Patient also told EMS that over the last 3 days, she has had dark stools with blood clots.  On initial EMS arrival, CBG was 71.  Repeat showed that patient had a blood sugar of 51 here in the department.   Patient given initial D50. Plan to check basic labs including CBC, CMP, CXR, Troponin, EKG, Fecal occult.   Labs and imaging reviewed.  Fecal occult is positive.  I-STAT troponin is 0.04.  Repeat CBG is 104.  CMP shows potassium of 6.3, CO2 is 36, BUN of 111 and creatinine of 13.31.  CBC shows hemoglobin of 4.6 and hematocrit of 14.5.  Chest x-ray shows increased vascular congestion and bibasilar infiltrates, left greater than right.   Discussed results with patient including the potential GI bleed with a hemoglobin of 4.6.  Patient is a Sales promotion account executive Witness and does not wish to receive a blood transplant at this time.  I discussed at length with patient regarding the risk first benefits of declining a blood transfusion at this time, including but not limited to death.  Patient refuses blood transfusion at this time.  Records reviewed show that patient has had consistently heme positive stools and is followed by lower GI.  She had a blood transfusion in 2017.  I discussed this with patient she states that that time she was very weak.  I explained her now that her blood levels are at the same level that they were a year ago and that the results of refusing a blood transfusion could be death. RN  Patrecia Pace and Dr. Roderic Palau witnessed patient's refusal of blood transfusion.  Patient is mentating appropriately and responding to verbal stimuli.  I believe patient exhibits full medical decision-making capacity.  Plan to consult nephrology and GI.  Discussed with PA Sarah ([facility]).  They will come and evaluate patient.  They do not recommend a Protonix drip.  Discussed patient with Dr. Lorrin Jackson (Nephrology). Will come evaluate the patient. Will attempt to get patient dialysis today.  Discussed patient with Internal Medicine resident. He states that patient is not unassigned and goes to [facility].   Discussed patient with Dr. Eliseo Squires (Hospitalist). Will admit.     Final Clinical Impressions(s) / ED  Diagnoses   Final diagnoses:  Generalized weakness  Lower GI bleed  End stage renal disease [facility])  Hyperkalemia    ED Discharge Orders    None       Desma Mcgregor 07/16/17 1811    Milton Ferguson, MD 07/17/17 1036

## 2017-07-16 NOTE — H&P (Signed)
History and Physical    [name] YWV:[id number] DOB: 1966/04/15 DOA: 07/16/2017  Referring MD/NP/PA: er PCP: [name], MD Outpatient Specialists:  Patient coming from: home  Chief Complaint: weakness  HPI: [name] is a 51 y.o. female who is a Jehovah Witness with medical history significant of ESRD, anemia, cocaine abuse and non-compliance.  Patient has not been to dialysis in 2 week as she decided to manage her ESRD with herbal medicine including activated charcoal, black charcoal, etc).  But she called EMS today because she felt overall fatigue and weakness.  Patient reports chest tightness, LE edema and dark stools.    She denies cocaine use in last few months.  No SI or HI.  In the ER, her K was found to be elevated.  Her Hgb was 4--- she refused transfusion as she is Jehovah Witness.  She was fecal occult positive.  GI consulted and renal consulted as well.  Plan is for HD later today.  Plan with GI is to start IV PPI.      Review of Systems: all systems reviewed, negative unless stated above in HPI   Past Medical History:  Diagnosis Date  . Anemia   . Anxiety   . Bipolar disorder, unspecified ([place]) 02/06/2014  . Chronic combined systolic and diastolic CHF (congestive heart failure) (HCC)    EF 40% by echo and 48% by stress test  . Chronic pain syndrome   . Depression   . ESRD on hemodialysis ([place])   . GERD (gastroesophageal reflux disease)   . Glaucoma   . Headache    sinus headaches  . Hepatitis C   . High cholesterol   . History of hiatal hernia   . History of vitamin D deficiency 06/20/2015  . Hypertension   . MRSA infection ?2014   "on the back of my head; spread throughout my bloodstream"  . Neuropathy   . Refusal of blood transfusions as patient is Jehovah's Witness   . Schizoaffective disorder, unspecified condition 02/08/2014  . Seizure [facility]) dx'd 2014   "don't know what kind; last one was ~ 04/2014"  . Stroke [facility])    TIA 2009  . TIA  (transient ischemic attack) 2010  . Type II diabetes mellitus ([place])    INSULIN DEPENDENT    Past Surgical History:  Procedure Laterality Date  . ABDOMINAL HYSTERECTOMY  2014  . ABDOMINAL HYSTERECTOMY  2010  . AMPUTATION Left 05/21/2013   Procedure: Left Midfoot AMPUTATION;  Surgeon: Newt Minion, MD;  Location: [place];  Service: Orthopedics;  Laterality: Left;  Left Midfoot Amputation  . AV FISTULA PLACEMENT Left 05/04/2015   Procedure: ARTERIOVENOUS (AV) GRAFT INSERTION;  Surgeon: Angelia Mould, MD;  Location: [place];  Service: Vascular;  Laterality: Left;  . COLONOSCOPY WITH PROPOFOL N/A 06/22/2013   Procedure: COLONOSCOPY WITH PROPOFOL;  Surgeon: Jeryl Columbia, MD;  Location: [facility] ENDOSCOPY;  Service: Endoscopy;  Laterality: N/A;  . ESOPHAGOGASTRODUODENOSCOPY N/A 05/11/2015   Procedure: ESOPHAGOGASTRODUODENOSCOPY (EGD);  Surgeon: Laurence Spates, MD;  Location: [facility] ENDOSCOPY;  Service: Endoscopy;  Laterality: N/A;  . EYE SURGERY Bilateral 2010   Lasik  . FLEXIBLE SIGMOIDOSCOPY N/A 01/17/2017   Procedure: FLEXIBLE SIGMOIDOSCOPY w/ hemorrhoid banding possible;  Surgeon: Gatha Mayer, MD;  Location: [facility] ENDOSCOPY;  Service: Endoscopy;  Laterality: N/A;  . FOOT AMPUTATION Left    due diabetic neuropathy, could not feel ulcer on bottom of foot  . REFRACTIVE SURGERY Bilateral 2013  . TONSILLECTOMY  1970's  .  TUBAL LIGATION  2000     reports that she quit smoking about 2 years ago. Her smoking use included cigarettes. She has a 20.00 pack-year smoking history. she has never used smokeless tobacco. She reports that she drinks about 1.8 oz of alcohol per week. She reports that she uses drugs. Drugs: "Crack" cocaine and Cocaine.  Allergies  Allergen Reactions  . Penicillins Hives and Other (See Comments)    Has patient had a PCN reaction causing immediate rash, facial/tongue/throat swelling, SOB or lightheadedness with hypotension: Yes Has patient had a PCN reaction causing severe rash  involving mucus membranes or skin necrosis: No Has patient had a PCN reaction that required hospitalization No Has patient had a PCN reaction occurring within the last 10 years: No If all of the above answers are "NO", then may proceed with Cephalosporin use. Pt tolerates cefepime and ceftriaxone  . Gabapentin Itching and Swelling  . Lyrica [Pregabalin] Swelling and Other (See Comments)    Facial and leg swelling  . Saphris [Asenapine] Swelling and Other (See Comments)    Facial swelling  . Tramadol Swelling and Other (See Comments)    Leg swelling  . Other Other (See Comments)    NO BLOOD PRODUCTS. 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 Hypertension Mother   . Diabetes Mother   . Hypertension Father   . Diabetes Father    Unacceptable: Noncontributory, unremarkable, or negative. Acceptable: Family history reviewed and not pertinent (If you reviewed it)  Prior to Admission medications   Medication Sig Start Date End Date Taking? Authorizing Provider  albuterol (PROVENTIL HFA;VENTOLIN HFA) 108 (90 Base) MCG/ACT inhaler Inhale 2 puffs into the lungs every 4 (four) hours as needed for wheezing or shortness of breath. 10/15/16   Merryl Hacker, MD  allopurinol (ZYLOPRIM) 100 MG tablet Take 1 tablet (100 mg total) by mouth every Tuesday, Thursday, and Saturday at 6 PM. 01/25/17   Nita Sells, MD  amLODipine (NORVASC) 10 MG tablet Take 1 tablet (10 mg total) by mouth at bedtime. 09/02/16   Hildred Priest, MD  atorvastatin (LIPITOR) 40 MG tablet Take 1 tablet (40 mg total) by mouth at bedtime. 09/02/16   Hildred Priest, MD  blood glucose meter kit and supplies KIT Dispense based on patient and insurance preference. Use up to four times daily as directed. (FOR ICD-9 250.00, 250.01). 06/07/17   Ina Homes, MD  calcium acetate (PHOSLO) 667 MG capsule Take 2 capsules (1,334 mg total) by mouth 3 (three) times daily with  meals. Patient taking differently: Take 667 mg by mouth 3 (three) times daily with meals.  09/02/16   Hildred Priest, MD  carvedilol (COREG) 25 MG tablet Take 1 tablet (25 mg total) by mouth 2 (two) times daily with a meal. 09/02/16   Hildred Priest, MD  cloNIDine (CATAPRES) 0.1 MG tablet Take 1 tablet (0.1 mg total) by mouth every 8 (eight) hours. 09/02/16   Hildred Priest, MD  diphenhydrAMINE (BENADRYL) 25 mg capsule Take 25 mg by mouth every 6 (six) hours as needed for itching. Patient is given one capsule on dialysis days    [provider]  Ferrous Sulfate (SLOW FE PO) Take 60 tablets by mouth daily.    [provider]  furosemide (LASIX) 40 MG tablet Take 40 mg by mouth daily. 05/27/17   [provider]  haloperidol (HALDOL) 0.5 MG tablet Take 1 tablet (0.5 mg total) by mouth every 8 (  eight) hours as needed for agitation. Patient not taking: Reported on 06/03/2017 09/03/16   Hildred Priest, MD  hydrocortisone (ANUSOL-HC) 2.5 % rectal cream Place rectally 2 (two) times daily. 01/24/17   Nita Sells, MD  insulin aspart (NOVOLOG) 100 UNIT/ML injection Inject 7 Units into the skin 3 (three) times daily with meals. 06/07/17   Ina Homes, MD  insulin glargine (LANTUS) 100 UNIT/ML injection Inject 0.2 mLs (20 Units total) into the skin at bedtime. 06/07/17   Ina Homes, MD  LORazepam (ATIVAN) 2 MG/ML injection Inject 1 mL (2 mg total) into the vein Every Tuesday,Thursday,and Saturday with dialysis. Patient not taking: Reported on 06/03/2017 09/03/16   Hildred Priest, MD  multivitamin (RENA-VIT) TABS tablet Take 1 tablet by mouth at bedtime. 09/02/16   Hildred Priest, MD  mupirocin ointment (BACTROBAN) 2 % Apply 1 application topically daily. 03/06/17   [provider]  Nutritional Supplements (FEEDING SUPPLEMENT, NEPRO CARB STEADY,) LIQD Take 237 mLs by mouth 3 (three) times daily  between meals. 02/28/17   Mikhail, Velta Addison, DO  Oxycodone HCl 10 MG TABS Take 1 tablet (10 mg total) by mouth every 6 (six) hours as needed. Patient not taking: Reported on 06/03/2017 02/28/17   Cristal Ford, DO  sertraline (ZOLOFT) 50 MG tablet Take 1 tablet (50 mg total) by mouth daily. 09/02/16   Hildred Priest, MD  traZODone (DESYREL) 50 MG tablet Take 0.5 tablets (25 mg total) by mouth at bedtime. 09/02/16   Hildred Priest, MD  vitamin A 10000 UNIT capsule Take 10,000 Units by mouth daily.    [provider]  vitamin C (ASCORBIC ACID) 500 MG tablet Take 500 mg by mouth daily.    [provider]    Physical Exam: Vitals:   07/16/17 1515 07/16/17 1530 07/16/17 1545 07/16/17 1600  BP: 119/62 121/66 131/66 135/67  Pulse: (!) 53 (!) 51 (!) 54 (!) 45  Resp: '12 12 18 11  '$ SpO2: 99% 100% 100% 99%      Constitutional: ill appearing Vitals:   07/16/17 1515 07/16/17 1530 07/16/17 1545 07/16/17 1600  BP: 119/62 121/66 131/66 135/67  Pulse: (!) 53 (!) 51 (!) 54 (!) 45  Resp: '12 12 18 11  '$ SpO2: 99% 100% 100% 99%   Eyes: PERRL, lids and conjunctivae normal ENMT: Mucous membranes are moist. Posterior pharynx clear of any exudate or lesions.Normal dentition.  Neck: normal, supple, no masses, no thyromegaly Respiratory: crackles at bases  Cardiovascular: Regular rate and rhythm, no murmurs / rubs / gallops. +LE edema Abdomen: no tenderness, no masses palpated. No hepatosplenomegaly. Bowel sounds positive.  Musculoskeletal: left midfoot amputation with non-heeling ulcer on heel  Neurologic: CN 2-12 grossly intact. Sensation intact, DTR normal. Strength 5/5 in all 4.  Psychiatric: Normal judgment and insight. Alert and oriented x 3. Normal mood.     Labs on Admission: I have personally reviewed following labs and imaging studies  CBC: Recent Labs  Lab 07/16/17 1338  WBC 3.1*  NEUTROABS 2.0  HGB 4.6*  HCT 14.5*  MCV 92.9  PLT 85*   Basic  Metabolic Panel: Recent Labs  Lab 07/16/17 1338  NA 135  K 6.3*  CL 75*  CO2 36*  GLUCOSE 55*  BUN 111*  CREATININE 13.31*  CALCIUM 8.6*   GFR: CrCl cannot be calculated (Unknown ideal weight.). Liver Function Tests: Recent Labs  Lab 07/16/17 1338  AST 27  ALT 21  ALKPHOS 90  BILITOT 0.7  PROT 6.5  ALBUMIN 3.0*   No results  for input(s): LIPASE, AMYLASE in the last 168 hours. No results for input(s): AMMONIA in the last 168 hours. Coagulation Profile: No results for input(s): INR, PROTIME in the last 168 hours. Cardiac Enzymes: No results for input(s): CKTOTAL, CKMB, CKMBINDEX, TROPONINI in the last 168 hours. BNP (last 3 results) No results for input(s): PROBNP in the last 8760 hours. HbA1C: No results for input(s): HGBA1C in the last 72 hours. CBG: Recent Labs  Lab 07/16/17 1327 07/16/17 1414  GLUCAP 51* 104*   Lipid Profile: No results for input(s): CHOL, HDL, LDLCALC, TRIG, CHOLHDL, LDLDIRECT in the last 72 hours. Thyroid Function Tests: No results for input(s): TSH, T4TOTAL, FREET4, T3FREE, THYROIDAB in the last 72 hours. Anemia Panel: No results for input(s): VITAMINB12, FOLATE, FERRITIN, TIBC, IRON, RETICCTPCT in the last 72 hours. Urine analysis:    Component Value Date/Time   COLORURINE YELLOW 02/25/2017 1224   APPEARANCEUR HAZY (A) 02/25/2017 1224   LABSPEC 1.013 02/25/2017 1224   PHURINE 8.0 02/25/2017 1224   GLUCOSEU >=500 (A) 02/25/2017 1224   HGBUR SMALL (A) 02/25/2017 1224   BILIRUBINUR NEGATIVE 02/25/2017 1224   BILIRUBINUR neg 12/13/2014 1127   KETONESUR NEGATIVE 02/25/2017 1224   PROTEINUR >=300 (A) 02/25/2017 1224   UROBILINOGEN 0.2 05/01/2015 1252   NITRITE NEGATIVE 02/25/2017 1224   LEUKOCYTESUR NEGATIVE 02/25/2017 1224   Sepsis Labs: Invalid input(s): PROCALCITONIN, LACTICIDVEN No results found for this or any previous visit (from the past 240 hour(s)).   Radiological Exams on Admission: Dg Chest Port 1 View  Result Date:  07/16/2017 CLINICAL DATA:  Weakness and GI bleeding EXAM: PORTABLE CHEST 1 VIEW COMPARISON:  06/03/2017 FINDINGS: Cardiac shadow is enlarged but stable. Increased vascular congestion is noted. Bibasilar infiltrates are seen with associated effusions. No bony abnormality is noted. IMPRESSION: Increased vascular congestion. Bibasilar infiltrates left greater than right. Electronically Signed   By: Inez Catalina M.D.   On: 07/16/2017 15:08    EKG: Independently reviewed. Sinus with prolonged Qtc  Assessment/Plan Active Problems:   Chronic combined systolic (congestive) and diastolic (congestive) heart failure (HCC)   Hyperkalemia   Cocaine use disorder, severe, dependence (HCC)   ESRD (end stage renal disease) (HCC)   Anemia due to chronic kidney disease   Volume overload   Volume overload/uremia with hyperkalemia due to missed HD -for HD today and will suspect patient will need several days in a row -appreciate renal consult ([place])  Heme + stool -long history of heme + stools -appreciate GI consult ([place]-discussed plan) -IV protonix -no EGD/colon as Hgb too low -if re-bleeds can get CTA of abd/pelvis -aranesp/Fe per renal  ESRD -getting HD today  Anemia -combo of ABLA and CKD -refusing transfusion for religious beliefs -defer Fe and aranesp to renal  H/o cocaine abuse -UDS -denies recent use  DM with hypoglycemia -clear diet for now -SSI -hold lantus  Bradycardia -? From hyperkalemia -hold BB    DVT prophylaxis: scd Code Status: full Family Communication: patient Disposition Plan:  Consults called: GI/renal Admission status: sdu   [facility] Pager [phone]  If 7PM-7AM, please contact night-coverage [email or web address] Password [facility]  07/16/2017, 4:47 PM

## 2017-07-16 NOTE — Progress Notes (Signed)
  GI UPDATE:  I spoke with anesthesia, Dr. Ola Spurr, about this case in light of the patient's religious beliefs and refusal of blood products. With her current Hgb, level in 4s, she is at risk for cardiovascular collapse with any type of anesthesia or sedation, as she will lose her sympathic drive, which could precipitate her death. They do not recommend any anesthesia or sedation at this time unless she receives a blood transfusion, get Hgb at least to 6, unless she is actively hemorrhaging to death. This unfortunately will prohibit any elective endoscopic evaluation. Patient continues to adamantly decline a blood transfusion. Continue empiric PPI and await her dialysis to correct hyperkalemia. Aranesp and IV iron ordered. We will reassess the patient tomorrow. If she has active hemorrhaging overnight consider CT angio and call the on-call GI provider.  Amity Cellar, MD [facility] Pager [phone]

## 2017-07-16 NOTE — Progress Notes (Signed)
Instructed Ms. [name] on the proper way to wear the warming blanket and the importance of it and she refused to wear it correctly. Vital signs stable except for temp, will continue to monitor.

## 2017-07-16 NOTE — ED Triage Notes (Signed)
Pt presents via [facility] for evaluation of weakness and gi bleed x 3 days. Pt reports quit going to dialysis 2 weeks ago, missed 5 treatments because "she didn't want to be on it anymore." pt describes dark red/black clots in stool. Pt CBG 51 on arrival to ED.

## 2017-07-16 NOTE — Progress Notes (Signed)
Patient was seen on dialysis and the procedure was supervised.   BFR 250 Via Left FAL BP is 142/76.  AP -50  VP 100 UF increase to 1 liter net (she is edematous) Bath Change to 1K for 2 hrs  Patient appears to be tolerating treatment well even though her temp is 93; she has a warmer on.  Otelia Santee, MD 07/16/2017, 11:13 PM

## 2017-07-16 NOTE — Consult Note (Signed)
[facility] Consult: 4:17 PM 07/16/2017  LOS: 0 days    Referring Provider: Dr Roderic Palau  Primary Care Physician:  Endocrinology, [facility]ologist: unassigned.  Has been seen by multiple [facility], [facility] and Carlean Purl.        Reason for Consultation:  anemia   HPI: [name] is a 51 y.o. female.  Hx IDDM.  HTN.  ESRD on HD.  Bipolar, schizoaffective D/O.  TIA. 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 Peripheral neuropathy.  Chronic polyarticular and all over body muscular pain managed with stable doses of oxycodone.  Diastolic and systolic heart failure. R supracondylar elbow fracture with non-union.  Hx Hepatitis C.  HCV Ab >11, quant 3,510.000  in 04/2015. Hx blood loss anemia in 09/2015, at that time hgb 4.8 and she did allow transfusion of PRBC x 3.   06/2013 colonoscopy, Dr. Watt Climes. For hematochezia.  Poor prep limited visualization, so lesions could have been missed however no fresh or old blood visualized. Found small internal and external hemorrhoids with a small associated, but non-bleeding, tear, likely source of the reported red blood. 05/11/15 EGD, Dr. Laurence Spates.  For heme positive stool and right upper quadrant pain.  Study unremarkable. Pain attributed to gallstones. Latest liver imaging: 05/2015 CT with contrast: liver normal.  On 07/2015 ultrasound liver texture "coarse" as previously noted.    Last GI consults were for rectal bleeding.  In 07/2016, Dr Ardis Hughs elected for no endoscopic workup of isolated rectal bleeding following hard to pass stool.  Hgb 6.7 but she refused transfusion.   In 01/2016 Dr Carlean Purl consulted for Hgb 5.8 and intermittent rectal bleeding. Flex Sig 01/17/17 showed reduced rectal sphincter tone, small external hemorrhoids, multiple sigmoid diverticulae.  Pt treated with HC topical cream.  Refused transfusion for Hgb 5.8.  Hgb rebounded in ensuing months, up to high of 9.7 on 06/03/17.  9 on 06/05/17 but today 4.6.  Platelets 85 K (120s in October, November 2018)     Patient's last hemodialysis session was 06/28/17.  After that she quit going to dialysis because she was tired of everything and tired of feeling lousy.  She spent "$200" on herbal supplements including ginkgo, vitamin C, multivitamin, calcium with vitamin D, "circulation bland ", magnesium, kidney support and activated charcoal.  She developed lower extremity edema and weakness.  Started feeling achy all over, having headaches, blurry vision.  Her legs are heavy and she is hardly able to get up and take a few steps.  She is having difficulty breathing.  For 3 or 4 days she has been passing darkish red/black, loose stools twice daily.  She has been vomiting for several days but the emesis is clear green or yellow bile.  She denies specific abdominal pain, she just says she hurts all over.  Has not been using NSAIDs.  She came to the ED today.  Patient doesn't use NSAIDs. She doesn't drink alcohol. She denies dysphagia, heartburn. Up until last week she hadn't seen any bleeding per rectum.  In the past she has cut short her  dialysis sessions due to abdominal pain. She generally doesn't have issues with nausea or vomiting and no recent excessive bleeding or vigorous bruising. No epistaxis.   There is no family history of colon cancer, GI bleeding or ulcer disease. She does not know of any liver disease. She has many close relatives who are dialysis patients.    Past Medical History:  Diagnosis Date  . Anemia   . Anxiety   . Bipolar disorder, unspecified ([place]) 02/06/2014  . Chronic combined systolic and diastolic CHF (congestive heart failure) (HCC)    EF 40% by echo and 48% by stress test  . Chronic pain syndrome   . Depression   . ESRD on hemodialysis ([place])   . GERD (gastroesophageal  reflux disease)   . Glaucoma   . Headache    sinus headaches  . Hepatitis C   . High cholesterol   . History of hiatal hernia   . History of vitamin D deficiency 06/20/2015  . Hypertension   . MRSA infection ?2014   "on the back of my head; spread throughout my bloodstream"  . Neuropathy   . Refusal of blood transfusions as patient is Jehovah's Witness   . Schizoaffective disorder, unspecified condition 02/08/2014  . Seizure [facility]) dx'd 2014   "don't know what kind; last one was ~ 04/2014"  . Stroke [facility])    TIA 2009  . TIA (transient ischemic attack) 2010  . Type II diabetes mellitus ([place])    INSULIN DEPENDENT    Past Surgical History:  Procedure Laterality Date  . ABDOMINAL HYSTERECTOMY  2014  . ABDOMINAL HYSTERECTOMY  2010  . AMPUTATION Left 05/21/2013   Procedure: Left Midfoot AMPUTATION;  Surgeon: Newt Minion, MD;  Location: [facility];  Service: Orthopedics;  Laterality: Left;  Left Midfoot Amputation  . AV FISTULA PLACEMENT Left 05/04/2015   Procedure: ARTERIOVENOUS (AV) GRAFT INSERTION;  Surgeon: Angelia Mould, MD;  Location: [facility];  Service: Vascular;  Laterality: Left;  . COLONOSCOPY WITH PROPOFOL N/A 06/22/2013   Procedure: COLONOSCOPY WITH PROPOFOL;  Surgeon: Jeryl Columbia, MD;  Location: [facility] ENDOSCOPY;  Service: Endoscopy;  Laterality: N/A;  . ESOPHAGOGASTRODUODENOSCOPY N/A 05/11/2015   Procedure: ESOPHAGOGASTRODUODENOSCOPY (EGD);  Surgeon: Laurence Spates, MD;  Location: [facility] ENDOSCOPY;  Service: Endoscopy;  Laterality: N/A;  . EYE SURGERY Bilateral 2010   Lasik  . FLEXIBLE SIGMOIDOSCOPY N/A 01/17/2017   Procedure: FLEXIBLE SIGMOIDOSCOPY w/ hemorrhoid banding possible;  Surgeon: Gatha Mayer, MD;  Location[facility] ENDOSCOPY;  Service: Endoscopy;  Laterality: N/A;  . FOOT AMPUTATION Left    due diabetic neuropathy, could not feel ulcer on bottom of foot  . REFRACTIVE SURGERY Bilateral 2013  . TONSILLECTOMY  1970's  . TUBAL LIGATION  2000    Prior to Admission  medications   Medication Sig Start Date End Date Taking? Authorizing Provider  albuterol (PROVENTIL HFA;VENTOLIN HFA) 108 (90 Base) MCG/ACT inhaler Inhale 2 puffs into the lungs every 4 (four) hours as needed for wheezing or shortness of breath. 10/15/16   Merryl Hacker, MD  allopurinol (ZYLOPRIM) 100 MG tablet Take 1 tablet (100 mg total) by mouth every Tuesday, Thursday, and Saturday at 6 PM. 01/25/17   Nita Sells, MD  amLODipine (NORVASC) 10 MG tablet Take 1 tablet (10 mg total) by mouth at bedtime. 09/02/16   Hildred Priest, MD  atorvastatin (LIPITOR) 40 MG tablet Take 1 tablet (40 mg total) by mouth at bedtime. 09/02/16   Hildred Priest, MD  blood glucose meter kit  and supplies KIT Dispense based on patient and insurance preference. Use up to four times daily as directed. (FOR ICD-9 250.00, 250.01). 06/07/17   Ina Homes, MD  calcium acetate (PHOSLO) 667 MG capsule Take 2 capsules (1,334 mg total) by mouth 3 (three) times daily with meals. Patient taking differently: Take 667 mg by mouth 3 (three) times daily with meals.  09/02/16   Hildred Priest, MD  carvedilol (COREG) 25 MG tablet Take 1 tablet (25 mg total) by mouth 2 (two) times daily with a meal. 09/02/16   Hildred Priest, MD  cloNIDine (CATAPRES) 0.1 MG tablet Take 1 tablet (0.1 mg total) by mouth every 8 (eight) hours. 09/02/16   Hildred Priest, MD  diphenhydrAMINE (BENADRYL) 25 mg capsule Take 25 mg by mouth every 6 (six) hours as needed for itching. Patient is given one capsule on dialysis days    [provider]  Ferrous Sulfate (SLOW FE PO) Take 60 tablets by mouth daily.    [provider]  furosemide (LASIX) 40 MG tablet Take 40 mg by mouth daily. 05/27/17   [provider]  haloperidol (HALDOL) 0.5 MG tablet Take 1 tablet (0.5 mg total) by mouth every 8 (eight) hours as needed for agitation. Patient not taking: Reported on 06/03/2017  09/03/16   Hildred Priest, MD  hydrocortisone (ANUSOL-HC) 2.5 % rectal cream Place rectally 2 (two) times daily. 01/24/17   Nita Sells, MD  insulin aspart (NOVOLOG) 100 UNIT/ML injection Inject 7 Units into the skin 3 (three) times daily with meals. 06/07/17   Ina Homes, MD  insulin glargine (LANTUS) 100 UNIT/ML injection Inject 0.2 mLs (20 Units total) into the skin at bedtime. 06/07/17   Ina Homes, MD  LORazepam (ATIVAN) 2 MG/ML injection Inject 1 mL (2 mg total) into the vein Every Tuesday,Thursday,and Saturday with dialysis. Patient not taking: Reported on 06/03/2017 09/03/16   Hildred Priest, MD  multivitamin (RENA-VIT) TABS tablet Take 1 tablet by mouth at bedtime. 09/02/16   Hildred Priest, MD  mupirocin ointment (BACTROBAN) 2 % Apply 1 application topically daily. 03/06/17   [provider]  Nutritional Supplements (FEEDING SUPPLEMENT, NEPRO CARB STEADY,) LIQD Take 237 mLs by mouth 3 (three) times daily between meals. 02/28/17   Mikhail, Velta Addison, DO  Oxycodone HCl 10 MG TABS Take 1 tablet (10 mg total) by mouth every 6 (six) hours as needed. Patient not taking: Reported on 06/03/2017 02/28/17   Cristal Ford, DO  sertraline (ZOLOFT) 50 MG tablet Take 1 tablet (50 mg total) by mouth daily. 09/02/16   Hildred Priest, MD  traZODone (DESYREL) 50 MG tablet Take 0.5 tablets (25 mg total) by mouth at bedtime. 09/02/16   Hildred Priest, MD  vitamin A 10000 UNIT capsule Take 10,000 Units by mouth daily.    [provider]  vitamin C (ASCORBIC ACID) 500 MG tablet Take 500 mg by mouth daily.    [provider]    Scheduled Meds:  Infusions:  PRN Meds:    Allergies as of 07/16/2017 - Review Complete 07/16/2017  Allergen Reaction Noted  . Penicillins Hives and Other (See Comments) 09/30/2014  . Gabapentin Itching and Swelling 02/09/2013  . Lyrica [pregabalin] Swelling and Other (See Comments)  09/30/2014  . Saphris [asenapine] Swelling and Other (See Comments) 09/30/2014  . Tramadol Swelling and Other (See Comments) 09/30/2014  . Other Other (See Comments) 05/01/2015    Family History  Problem Relation Age of Onset  . Hypertension Mother   . Diabetes Mother   .  Hypertension Father   . Diabetes Father     Social History   Socioeconomic History  . Marital status: Single    Spouse name: Not on file  . Number of children: 3  . Years of education: 38  . Highest education level: 12th grade  Social Needs  . Financial resource strain: Not on file  . Food insecurity - worry: Not on file  . Food insecurity - inability: Not on file  . Transportation needs - medical: Not on file  . Transportation needs - non-medical: Not on file  Occupational History  . Occupation: disabled  Tobacco Use  . Smoking status: Former Smoker    Packs/day: 1.00    Years: 20.00    Pack years: 20.00    Types: Cigarettes    Last attempt to quit: 03/31/2015    Years since quitting: 2.2  . Smokeless tobacco: Never Used  . Tobacco comment: "stopped smoking in ~ 2013; restarted last week; stopped again 06/17/2014"  Substance and Sexual Activity  . Alcohol use: Yes    Alcohol/week: 1.8 oz    Types: 3 Standard drinks or equivalent per week    Comment: 05/01/15 patient denies alcohol use.  . Drug use: Yes    Types: "Crack" cocaine, Cocaine    Comment: pt reports last use June 2018  . Sexual activity: Not Currently    Birth control/protection: Abstinence  Other Topics Concern  . Not on file  Social History Narrative   ** Merged History Encounter **   Single, lives with son in a one story home. Unable to exercise. Avoids caffeine. 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        REVIEW OF SYSTEMS: Constitutional:  Per HPI ENT:  No nose bleeds Pulm:  Per HPI.  No coughing CV: Lower extremity edema.  No chest pain.  No palpitations. GU:  No hematuria, no  frequency GI:  Per HPI.   Heme: No unusual or excessive bleeding or bruising. Transfusions: Refuses all transfusions presently and in the past. Neuro:  + headaches, no peripheral tingling or numbness Derm: Pruritus and multiple skin eruptions especially on her back. Endocrine:  No sweats or chills.  No polyuria or dysuria Immunization: Did not inquire as to recent or previous immunizations. Travel:  None beyond local counties in last few months.    PHYSICAL EXAM: Vital signs in last 24 hours: Vitals:   07/16/17 1545 07/16/17 1600  BP: 131/66 135/67  Pulse: (!) 54 (!) 45  Resp: 18 11  SpO2: 100% 99%   Wt Readings from Last 3 Encounters:  06/11/17 69.3 kg (152 lb 12.8 oz)  06/07/17 63.3 kg (139 lb 8.8 oz)  02/28/17 62.9 kg (138 lb 10.7 oz)   BP 135/67.  Pulse 55.  Respirations 11.  O2 sats on room air 99%. General: Edematous, ill looking AAF.  She is comfortable. Head: Facial swelling but no asymmetry.  No signs of head trauma. Eyes: Extreme conjunctival pallor, they are white. Ears: Not hard of hearing Nose: No congestion or discharge Mouth: Oropharynx moist and clear.  Mucosa pale.  Tongue midline. Neck: No masses or thyromegaly. Lungs: Fine rales in the bases.  No labored breathing or cough. Heart: RRR.  No MRG.  S1, S2 present Abdomen: Soft.  Not tender.  Bowel sounds active.  About a 5 cm oblong bruise at the mid abdomen.  No HSM.  No masses..   Rectal: [place], paced consistency stool.  Visible external  hemorrhoids which are flat. Musc/Skeltl: No gross joint deformity. Extremities: Edematous legs. Neurologic: Knows the year, the place, the situation.  She is easily arousable.  Can move all 4 limbs.  No involuntary movements or tremor. Skin: Multiple hyperpigmented lesions in a healing state on her posterior thorax.   Psych: Flat affect, depressed.  Cooperative.  Calm.  Intake/Output from previous day: No intake/output data recorded. Intake/Output this shift: No  intake/output data recorded.  LAB RESULTS: Recent Labs    07/16/17 1338  WBC 3.1*  HGB 4.6*  HCT 14.5*  PLT 85*   BMET Lab Results  Component Value Date   NA 135 07/16/2017   NA 132 (L) 06/05/2017   NA 131 (L) 06/04/2017   K 6.3 (HH) 07/16/2017   K 4.2 06/05/2017   K 4.0 06/04/2017   CL 75 (L) 07/16/2017   CL 95 (L) 06/05/2017   CL 92 (L) 06/04/2017   CO2 36 (H) 07/16/2017   CO2 25 06/05/2017   CO2 29 06/04/2017   GLUCOSE 55 (L) 07/16/2017   GLUCOSE 507 (HH) 06/05/2017   GLUCOSE 92 06/04/2017   BUN 111 (H) 07/16/2017   BUN 17 06/05/2017   BUN 18 06/04/2017   CREATININE 13.31 (H) 07/16/2017   CREATININE 3.41 (H) 06/05/2017   CREATININE 3.68 (H) 06/04/2017   CALCIUM 8.6 (L) 07/16/2017   CALCIUM 8.0 (L) 06/05/2017   CALCIUM 8.2 (L) 06/04/2017   LFT Recent Labs    07/16/17 1338  PROT 6.5  ALBUMIN 3.0*  AST 27  ALT 21  ALKPHOS 90  BILITOT 0.7   PT/INR Lab Results  Component Value Date   INR 1.29 01/16/2017   INR 1.35 07/21/2016   INR 1.4 (A) 12/23/2015   INR 1.2 (A) 12/23/2015   PROTIME 17.3 (A) 12/23/2015   PROTIME 15.9 (A) 12/23/2015   Hepatitis Panel No results for input(s): HEPBSAG, HCVAB, HEPAIGM, HEPBIGM in the last 72 hours. C-Diff No components found for: CDIFF Lipase     Component Value Date/Time   LIPASE 29 12/31/2016 2155    Drugs of Abuse     Component Value Date/Time   LABOPIA NONE DETECTED 01/01/2017 0024   COCAINSCRNUR POSITIVE (A) 01/01/2017 0024   LABBENZ NONE DETECTED 01/01/2017 0024   AMPHETMU NONE DETECTED 01/01/2017 0024   THCU NONE DETECTED 01/01/2017 0024   LABBARB NONE DETECTED 01/01/2017 0024     RADIOLOGY STUDIES: Dg Chest Port 1 View  Result Date: 07/16/2017 CLINICAL DATA:  Weakness and GI bleeding EXAM: PORTABLE CHEST 1 VIEW COMPARISON:  06/03/2017 FINDINGS: Cardiac shadow is enlarged but stable. Increased vascular congestion is noted. Bibasilar infiltrates are seen with associated effusions. No bony  abnormality is noted. IMPRESSION: Increased vascular congestion. Bibasilar infiltrates left greater than right. Electronically Signed   By: Inez Catalina M.D.   On: 07/16/2017 15:08     IMPRESSION:   *  Acute on chronic anemia.   FOBT + stools Hemorrhoids on 01/2017 Flex sig (for rectal bleeding) Med list includes daily oral iron I did not see this in her bag of meds.  *  Jehovah's witness.  Refuses transfusion.   *  Hepatitis C .  Never treated.  Last viral load was in 2016.  Liver "coarse per 07/2015 ultrasound but no formal dx of cirrhosis.    *  ESRD.  Stopped dialysis 2 weeks ago and self treated with oral charcoal.  She is now agreeing to undergo dialysis.  *  Bipolar and schizoaffective d/o.  PLAN:     *  First of all she needs blood transfusion but has refused this.  Secondly she needs upper endoscopy but it is doubtful that anesthesia will sedate her given the low Hgb.  *   Received a dose of IV Protonix.  Continue this twice daily.  Note orders placed for infusions of ferric gluconate as well as Aranesp with dialysis this evening.  *  Doe she need a palliative care consult to help her sort through her complicated medical situation?   Azucena Freed  07/16/2017, 4:17 PM Pager: [phone]

## 2017-07-17 ENCOUNTER — Other Ambulatory Visit: Payer: Self-pay

## 2017-07-17 DIAGNOSIS — R450 Nervousness: Secondary | ICD-10-CM

## 2017-07-17 DIAGNOSIS — Z008 Encounter for other general examination: Secondary | ICD-10-CM

## 2017-07-17 DIAGNOSIS — F17210 Nicotine dependence, cigarettes, uncomplicated: Secondary | ICD-10-CM

## 2017-07-17 DIAGNOSIS — F419 Anxiety disorder, unspecified: Secondary | ICD-10-CM

## 2017-07-17 DIAGNOSIS — I5042 Chronic combined systolic (congestive) and diastolic (congestive) heart failure: Secondary | ICD-10-CM

## 2017-07-17 DIAGNOSIS — D62 Acute posthemorrhagic anemia: Secondary | ICD-10-CM

## 2017-07-17 DIAGNOSIS — F1410 Cocaine abuse, uncomplicated: Secondary | ICD-10-CM

## 2017-07-17 DIAGNOSIS — F1420 Cocaine dependence, uncomplicated: Secondary | ICD-10-CM

## 2017-07-17 DIAGNOSIS — E875 Hyperkalemia: Secondary | ICD-10-CM

## 2017-07-17 DIAGNOSIS — K922 Gastrointestinal hemorrhage, unspecified: Secondary | ICD-10-CM

## 2017-07-17 LAB — GLUCOSE, CAPILLARY
GLUCOSE-CAPILLARY: 72 mg/dL (ref 65–99)
GLUCOSE-CAPILLARY: 112 mg/dL — AB (ref 65–99)
GLUCOSE-CAPILLARY: 119 mg/dL — AB (ref 65–99)
GLUCOSE-CAPILLARY: 160 mg/dL — AB (ref 65–99)
GLUCOSE-CAPILLARY: 147 mg/dL — AB (ref 65–99)
Glucose-Capillary: 97 mg/dL (ref 65–99)

## 2017-07-17 LAB — IRON AND TIBC
Iron: 96 ug/dL (ref 28–170)
Saturation Ratios: 38 % — ABNORMAL HIGH (ref 10.4–31.8)
TIBC: 253 ug/dL (ref 250–450)
UIBC: 157 ug/dL

## 2017-07-17 LAB — CBC
HEMATOCRIT: 14.6 % — AB (ref 36.0–46.0)
HEMOGLOBIN: 4.6 g/dL — AB (ref 12.0–15.0)
MCH: 29.5 pg (ref 26.0–34.0)
MCHC: 31.5 g/dL (ref 30.0–36.0)
MCV: 93.6 fL (ref 78.0–100.0)
Platelets: 103 10*3/uL — ABNORMAL LOW (ref 150–400)
RBC: 1.56 MIL/uL — AB (ref 3.87–5.11)
RDW: 14.5 % (ref 11.5–15.5)
WBC: 3.9 10*3/uL — AB (ref 4.0–10.5)

## 2017-07-17 LAB — FERRITIN: Ferritin: 3123 ng/mL — ABNORMAL HIGH (ref 11–307)

## 2017-07-17 LAB — BASIC METABOLIC PANEL
ANION GAP: 23 — AB (ref 5–15)
BUN: 81 mg/dL — ABNORMAL HIGH (ref 6–20)
CALCIUM: 8.5 mg/dL — AB (ref 8.9–10.3)
CHLORIDE: 81 mmol/L — AB (ref 101–111)
CO2: 30 mmol/L (ref 22–32)
Creatinine, Ser: 9.66 mg/dL — ABNORMAL HIGH (ref 0.44–1.00)
GFR calc non Af Amer: 4 mL/min — ABNORMAL LOW (ref >60)
GFR, EST AFRICAN AMERICAN: 5 mL/min — AB (ref >60)
Glucose, Bld: 92 mg/dL (ref 65–99)
Potassium: 5.7 mmol/L — ABNORMAL HIGH (ref 3.5–5.1)
Sodium: 134 mmol/L — ABNORMAL LOW (ref 135–145)

## 2017-07-17 MED ORDER — DARBEPOETIN ALFA 200 MCG/0.4ML IJ SOSY
200.0000 ug | PREFILLED_SYRINGE | INTRAMUSCULAR | Status: DC
  Administered 2017-07-18: 200 ug via INTRAVENOUS
  Filled 2017-07-17: qty 0.4

## 2017-07-17 MED ORDER — PANTOPRAZOLE SODIUM 40 MG PO TBEC
40.0000 mg | DELAYED_RELEASE_TABLET | Freq: Every day | ORAL | Status: DC
  Administered 2017-07-18 – 2017-07-19 (×2): 40 mg via ORAL
  Filled 2017-07-17 (×2): qty 1

## 2017-07-17 MED ORDER — HYDROCORTISONE ACETATE 25 MG RE SUPP
25.0000 mg | Freq: Two times a day (BID) | RECTAL | Status: DC
  Filled 2017-07-17 (×6): qty 1

## 2017-07-17 MED ORDER — ORAL CARE MOUTH RINSE
15.0000 mL | Freq: Two times a day (BID) | OROMUCOSAL | Status: DC
  Administered 2017-07-18 – 2017-07-19 (×2): 15 mL via OROMUCOSAL

## 2017-07-17 NOTE — Progress Notes (Signed)
PROGRESS NOTE    [name]  TGG:[id number] DOB: 14-Feb-1966 DOA: 07/16/2017 PCP: [name], MD   Brief Narrative: [name] is a 51 y.o. female who is a Jehovah Witness with medical history significant of ESRD, anemia, cocaine abuse and non-compliance. Patient presented with weakness and found to have a hemoglobin of 4.6 down from 9 one month ago. She has recurrent hematochezia. GI recommending no elective management.    Assessment & Plan:   Active Problems:   Chronic combined systolic (congestive) and diastolic (congestive) heart failure (HCC)   Hyperkalemia   Cocaine use disorder, severe, dependence (HCC)   ESRD (end stage renal disease) ([place])   Anemia due to chronic kidney disease   Volume overload   Acute blood loss anemia Bright red blood per rectum Patient with GI bleeding. Gastroenterology consulted but cannot perform elective procedure secondary to severe anemia and patient's refusal for blood products. Had an episode of hematochezia yesterday. Patient is symptomatic. Patient adamantly refuses blood products even in the event of life threatening bleeding/anemia. Risks thoroughly explained to patient. She chooses to still be full code. -CBC daily -In/out -GI recommendations -Palliative care consult -Psychiatry consult for capacity  Volume overload Uremia Hyperkalemia Secondary to missed dialysis. -Dialysis per nephrology  ESRD on HD -Hemodialysis per nephrology  Diabetes mellitus, type 2 -Continue SSI  ESRD on HD -Nephrology recommendations for dialysis  Essential hypertension  Depression -Continue Zoloft  Acute respiratory failure with hypoxia Dyspnea Secondary to severe anemia. Patient refusing blood transfusion -Wean O2 to room air as able    DVT prophylaxis: SCDs secondary to active bleeding Code Status: Full code Family Communication: None at bedside Disposition Plan: pending medical improvement and goals of care discussions in  addition to psychiatry evaluation   Consultants:   Gastroenterology  Palliative care medicine  Psychiatry  Procedures:   None  Antimicrobials:  None    Subjective: Tired, dyspnea.   Objective: Vitals:   07/17/17 0600 07/17/17 0748 07/17/17 0834 07/17/17 1216  BP: (!) 143/69 (!) 147/67  (!) 149/84  Pulse: 79 77  80  Resp: (!) 30 (!) 24  (!) 28  Temp:  98.6 F (37 C)  98 F (36.7 C)  TempSrc:  Oral  Oral  SpO2: 94% 95% 100% 99%  Weight:      Height:        Intake/Output Summary (Last 24 hours) at 07/17/2017 1341 Last data filed at 07/17/2017 0431 Gross per 24 hour  Intake 820 ml  Output 1000 ml  Net -180 ml   Filed Weights   07/16/17 1845 07/16/17 2230 07/17/17 0045  Weight: 77.6 kg (171 lb 1.2 oz) 77.6 kg (171 lb 1.2 oz) 76.6 kg (168 lb 14 oz)    Examination:  General exam: Appears calm and comfortable Respiratory system: Clear to auscultation. Respiratory effort normal. Cardiovascular system: S1 & S2 heard, RRR. No murmurs, rubs, gallops or clicks. Gastrointestinal system: Abdomen is nondistended, soft and nontender. No organomegaly or masses felt. Normal bowel sounds heard. Central nervous system: Alert and oriented. No focal neurological deficits. Extremities: LE edema. No calf tenderness Skin: No cyanosis. No rashes Psychiatry: Judgement and insight appear impaired. Mood & affect appropriate.     Data Reviewed: I have personally reviewed following labs and imaging studies  CBC: Recent Labs  Lab 07/16/17 1338 07/17/17 0557  WBC 3.1* 3.9*  NEUTROABS 2.0  --   HGB 4.6* 4.6*  HCT 14.5* 14.6*  MCV 92.9 93.6  PLT 85* 103*  Basic Metabolic Panel: Recent Labs  Lab 07/16/17 1338 07/17/17 0557  NA 135 134*  K 6.3* 5.7*  CL 75* 81*  CO2 36* 30  GLUCOSE 55* 92  BUN 111* 81*  CREATININE 13.31* 9.66*  CALCIUM 8.6* 8.5*  MG 3.9*  --    GFR: Estimated Creatinine Clearance: 6.9 mL/min (A) (by C-G formula based on SCr of 9.66 mg/dL  (H)). Liver Function Tests: Recent Labs  Lab 07/16/17 1338  AST 27  ALT 21  ALKPHOS 90  BILITOT 0.7  PROT 6.5  ALBUMIN 3.0*   No results for input(s): LIPASE, AMYLASE in the last 168 hours. No results for input(s): AMMONIA in the last 168 hours. Coagulation Profile: No results for input(s): INR, PROTIME in the last 168 hours. Cardiac Enzymes: No results for input(s): CKTOTAL, CKMB, CKMBINDEX, TROPONINI in the last 168 hours. BNP (last 3 results) No results for input(s): PROBNP in the last 8760 hours. HbA1C: No results for input(s): HGBA1C in the last 72 hours. CBG: Recent Labs  Lab 07/16/17 1414 07/16/17 1954 07/17/17 0248 07/17/17 0747 07/17/17 1214  GLUCAP 104* 66 97 72 112*   Lipid Profile: No results for input(s): CHOL, HDL, LDLCALC, TRIG, CHOLHDL, LDLDIRECT in the last 72 hours. Thyroid Function Tests: No results for input(s): TSH, T4TOTAL, FREET4, T3FREE, THYROIDAB in the last 72 hours. Anemia Panel: Recent Labs    07/16/17 2315  FERRITIN 3,123*  TIBC 253  IRON 96   Sepsis Labs: No results for input(s): PROCALCITON, LATICACIDVEN in the last 168 hours.  Recent Results (from the past 240 hour(s))  MRSA PCR Screening     Status: None   Collection Time: 07/16/17  6:52 PM  Result Value Ref Range Status   MRSA by PCR NEGATIVE NEGATIVE Final    Comment:        The GeneXpert MRSA Assay (FDA approved for NASAL specimens only), is one component of a comprehensive MRSA colonization surveillance program. It is not intended to diagnose MRSA infection nor to guide or monitor treatment for MRSA infections.          Radiology Studies: Dg Chest Port 1 View  Result Date: 07/16/2017 CLINICAL DATA:  Weakness and GI bleeding EXAM: PORTABLE CHEST 1 VIEW COMPARISON:  06/03/2017 FINDINGS: Cardiac shadow is enlarged but stable. Increased vascular congestion is noted. Bibasilar infiltrates are seen with associated effusions. No bony abnormality is noted.  IMPRESSION: Increased vascular congestion. Bibasilar infiltrates left greater than right. Electronically Signed   By: Inez Catalina M.D.   On: 07/16/2017 15:08        Scheduled Meds: . allopurinol  100 mg Oral Q T,Th,Sat-1800  . atorvastatin  40 mg Oral QHS  . calcium acetate  667 mg Oral TID WC  . darbepoetin (ARANESP) injection - DIALYSIS  200 mcg Intravenous Q Thu-HD  . feeding supplement (NEPRO CARB STEADY)  237 mL Oral TID BM  . hydrocortisone  25 mg Rectal BID  . insulin aspart  0-9 Units Subcutaneous TID WC  . mouth rinse  15 mL Mouth Rinse BID  . multivitamin  1 tablet Oral QHS  . pantoprazole  40 mg Oral Q0600  . sertraline  50 mg Oral Daily  . traZODone  25 mg Oral QHS  . vitamin C  500 mg Oral Daily   Continuous Infusions: . sodium chloride    . sodium chloride    . ferric gluconate (FERRLECIT/NULECIT) IV Stopped (07/17/17 1133)     LOS: 1 day  Cordelia Poche, MD [facility] 07/17/2017, 1:41 PM Pager: [phone]  If 7PM-7AM, please contact night-coverage [email or web address] Password [email or web address] 07/17/2017, 1:41 PM
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
## 2017-07-17 NOTE — Progress Notes (Signed)
Daily Rounding Note  07/17/2017, 10:20 AM  LOS: 1 day   SUBJECTIVE:   Chief complaint:  Rectal bleeding, small amount of blood mixed with stool last night.     Apetite reduced.  Feels cold.    OBJECTIVE:         Vital signs in last 24 hours:    Temp:  [92.1 F (33.4 C)-98.6 F (37 C)] 98.6 F (37 C) (12/13 0748) Pulse Rate:  [37-85] 77 (12/13 0748) Resp:  [11-30] 24 (12/13 0748) BP: (118-156)/(56-80) 147/67 (12/13 0748) SpO2:  [94 %-100 %] 100 % (12/13 0834) Weight:  [76.6 kg (168 lb 14 oz)-77.6 kg (171 lb 1.2 oz)] 76.6 kg (168 lb 14 oz) (12/13 0045) Last BM Date: 07/16/17 Filed Weights   07/16/17 1845 07/16/17 2230 07/17/17 0045  Weight: 77.6 kg (171 lb 1.2 oz) 77.6 kg (171 lb 1.2 oz) 76.6 kg (168 lb 14 oz)   General: looks frail and weak   Heart: RRR Chest: fine rales in bases Abdomen: NT, ND.  Active BS  Extremities: LE edema Neuro/Psych:  Oriented to time and place.   No tremor. Moves all 42.    Intake/Output from previous day: 12/12 0701 - 12/13 0700 In: 820 [P.O.:820] Out: 1000   Intake/Output this shift: No intake/output data recorded.  Lab Results: Recent Labs    07/16/17 1338 07/17/17 0557  WBC 3.1* 3.9*  HGB 4.6* 4.6*  HCT 14.5* 14.6*  PLT 85* 103*   BMET Recent Labs    07/16/17 1338 07/17/17 0557  NA 135 134*  K 6.3* 5.7*  CL 75* 81*  CO2 36* 30  GLUCOSE 55* 92  BUN 111* 81*  CREATININE 13.31* 9.66*  CALCIUM 8.6* 8.5*   LFT Recent Labs    07/16/17 1338  PROT 6.5  ALBUMIN 3.0*  AST 27  ALT 21  ALKPHOS 90  BILITOT 0.7   PT/INR No results for input(s): LABPROT, INR in the last 72 hours. Hepatitis Panel No results for input(s): HEPBSAG, HCVAB, HEPAIGM, HEPBIGM in the last 72 hours.  Studies/Results: Dg Chest Port 1 View  Result Date: 07/16/2017 CLINICAL DATA:  Weakness and GI bleeding EXAM: PORTABLE CHEST 1 VIEW COMPARISON:  06/03/2017 FINDINGS: Cardiac shadow is  enlarged but stable. Increased vascular congestion is noted. Bibasilar infiltrates are seen with associated effusions. No bony abnormality is noted. IMPRESSION: Increased vascular congestion. Bibasilar infiltrates left greater than right. Electronically Signed   By: Inez Catalina M.D.   On: 07/16/2017 15:08    ASSESMENT:   *  Acute on chronic anemia.  Multifactorial. FOBT + stools.  Not taking po iron at home.  Hgb stable at 4.6, this is too low for anesthesia to risk sedating for endoscopic procedure other than in setting of life-threatening hemorrhage.   Hemorrhoids on 01/2017 Flex sig (for rectal bleeding) Did not receive the Aranesp as ordered last night.  Receiving ferric gluconate currently, though ferritin is elevated to 3123 and not iron normal.    *  Jehovah's witness.  Continues to refuse transfusion.   *  Hepatitis C .  Never treated.  Last viral load was in 2016.  Liver "coarse" per 07/2015 ultrasound but no formal dx of cirrhosis.    *  ESRD.  Stopped dialysis 2 weeks ago and self treated with oral charcoal and various OTC supplements.  underwent dialysis late last night.    *  Bipolar and schizoaffective d/o.  PLAN   *  Observe, support.   No plans for EGD or Colonoscopy.   Given the pt's recent decsions regarding stopping dialysis due to QOL issues, should PAL care get involved with pt and any family to discuss and define pt goals of care plan?  Dr Havery Moros suggests ethics consult.    *  Added BID Anusol HC suppositories.    *  Spoke with Pharm D who will reorder the Aranesp to be given with HD today.    *  GI available PRN but will not continue to follow pt.  Call us if she developes life threatening hemorrhage.  No plans for following her in outpt setting.       Azucena Freed  07/17/2017, 10:20 AM Pager: [phone]

## 2017-07-17 NOTE — Progress Notes (Signed)
[facility] Progress Note  Subjective: feeling a little better, wants to get her "shot " to get her blood counts up  Vitals:   07/17/17 0600 07/17/17 0748 07/17/17 0834 07/17/17 1216  BP: (!) 143/69 (!) 147/67  (!) 149/84  Pulse: 79 77  80  Resp: (!) 30 (!) 24  (!) 28  Temp:  98.6 F (37 C)  98 F (36.7 C)  TempSrc:  Oral  Oral  SpO2: 94% 95% 100% 99%  Weight:      Height:        Inpatient medications: . allopurinol  100 mg Oral Q T,Th,Sat-1800  . atorvastatin  40 mg Oral QHS  . calcium acetate  667 mg Oral TID WC  . darbepoetin (ARANESP) injection - DIALYSIS  200 mcg Intravenous Q Thu-HD  . feeding supplement (NEPRO CARB STEADY)  237 mL Oral TID BM  . hydrocortisone  25 mg Rectal BID  . insulin aspart  0-9 Units Subcutaneous TID WC  . mouth rinse  15 mL Mouth Rinse BID  . multivitamin  1 tablet Oral QHS  . pantoprazole  40 mg Oral Q0600  . sertraline  50 mg Oral Daily  . traZODone  25 mg Oral QHS  . vitamin C  500 mg Oral Daily   . sodium chloride    . sodium chloride    . ferric gluconate (FERRLECIT/NULECIT) IV Stopped (07/17/17 1133)   sodium chloride, sodium chloride, acetaminophen **OR** acetaminophen, albuterol, alteplase, heparin, lidocaine (PF), lidocaine-prilocaine, ondansetron **OR** ondansetron (ZOFRAN) IV, pentafluoroprop-tetrafluoroeth  Exam: Puffy face, no distress +jVD Chest mostly clear, occ rales RRR no mrg ABd soft ntnd no ascites Ext 1-2+ edema NF, Ox 3 Left AVG +bruit  Home meds: -norvasc 10/ coreg 25 bid/ clon 0.1 tid/ lasix 40 qd  -lantus 20 hs/ novolog 7 tid -zoloft 50 qd/ ativan 2 mg w HD/ oxy IR 10 mg qid prn -allopurinol 100 tts/ lipitor/ phoslo / fe SO4/ MVI/ nepro/ alb nebs  D[facility]: [facility] 3[facility]h   2/2.25   63kg   Hep none  L AVG 400/800 -hect 1 ug -mircera 225 last 11/19 -Hb 8.2, tsat 25%, derr 1164, pth 243       Impression: 1. Uremia/ hyperkalemia - due to missed HD x 2.5 weeks 2. Vol overload 3. ESRD  on HD TTS 4. HTN - hold BP meds for now while getting vol off; resume as needed 5. Anemia of CKD - pt refusing transfusion. Plan max ESA today. Checking Fe stores.  6. MBD of CKD - cont hectorol, not sure binder 7. Bipolar disorder 8. Hep C+ 9. Chronic pain syndrome 10. Hx substance abuse 11. DM with low BS - per primary  Plan - on sched for HD again today, UF as Loleta Books MD [facility] pager [phone]   07/17/2017, 1:41 PM   Recent Labs  Lab 07/16/17 1338 07/17/17 0557  NA 135 134*  K 6.3* 5.7*  CL 75* 81*  CO2 36* 30  GLUCOSE 55* 92  BUN 111* 81*  CREATININE 13.31* 9.66*  CALCIUM 8.6* 8.5*   Recent Labs  Lab 07/16/17 1338  AST 27  ALT 21  ALKPHOS 90  BILITOT 0.7  PROT 6.5  ALBUMIN 3.0*   Recent Labs  Lab 07/16/17 1338 07/17/17 0557  WBC 3.1* 3.9*  NEUTROABS 2.0  --   HGB 4.6* 4.6*  HCT 14.5* 14.6*  MCV 92.9 93.6  PLT 85* 103*   Iron/TIBC/Ferritin/ %Sat  Component Value Date/Time   IRON 96 07/16/2017 2315   TIBC 253 07/16/2017 2315   FERRITIN 3,123 (H) 07/16/2017 2315   IRONPCTSAT 38 (H) 07/16/2017 2315

## 2017-07-17 NOTE — Progress Notes (Signed)
CSW consulted to have family meeting to discuss seriousness of patient condition with pt son- palliative also consulted for this- CSW available to assist as needed but more appropriate for palliative to discuss since they can explain medical condition and prognosis with son.  Jorge Ny, LCSW Clinical Social Worker [phone]

## 2017-07-17 NOTE — Care Management Note (Addendum)
Case Management Note  Patient Details  Name: NDIDI NESBY MRN: [id number] Date of Birth: November 10, 1965  Subjective/Objective:  From home with son, Jehovah Witness, persents with Anemia due to chronic kidney disease,  chronic systolic and diastolic HF, Hyperkalemia, psa, ESRD, volume over load. Palliative consulted.   12/14 [place], BSN - NCM was told that patient may need home oxygen,  patient states that her son smokes and he is slow, she is scared to take oxygen home with her son smoking, this is her sons house, she states she will be getting her own place next month. She states she also needs a rollator, and a neb machine, she would like to get these from [facility], and if she needs any HH services would like to use [facility].  NCM informed RN Anderson Malta of the rollator and neb machine. Patient for possible dc tomorrow.                 Action/Plan: For possible dc tomorrow, may need home oxygen, will need oxygen order and ambulatory sats, patient would like rollator and neb machine also, will need orders for this as well.    Expected Discharge Date:                  Expected Discharge Plan:     In-House Referral:     Discharge planning Services  CM Consult  Post Acute Care Choice:    Choice offered to:     DME Arranged:    DME Agency:     HH Arranged:    HH Agency:     Status of Service:  In process, will continue to follow  If discussed at Long Length of Stay Meetings, dates discussed:    Additional Comments:  Zenon Mayo, RN 07/17/2017, 4:34 PM

## 2017-07-17 NOTE — Progress Notes (Signed)
Chart reviewed. Patient refused to talk to me - says she is too tired. Agrees to talking tomorrow. Tells me I can reach out to her son and setup meeting.  Meeting scheduled with son 12/14 15:00.  Discussed case with Dr. Lonny Prude - will get psych eval.   No charge note.  Thank you for consulting the palliative medicine team.  Juel Burrow, DNP, [facility] Palliative Medicine Team Team Phone # [phone]

## 2017-07-18 DIAGNOSIS — Z7189 Other specified counseling: Secondary | ICD-10-CM

## 2017-07-18 DIAGNOSIS — K922 Gastrointestinal hemorrhage, unspecified: Secondary | ICD-10-CM

## 2017-07-18 LAB — BASIC METABOLIC PANEL
ANION GAP: 17 — AB (ref 5–15)
BUN: 46 mg/dL — ABNORMAL HIGH (ref 6–20)
CHLORIDE: 92 mmol/L — AB (ref 101–111)
CO2: 27 mmol/L (ref 22–32)
Calcium: 8.7 mg/dL — ABNORMAL LOW (ref 8.9–10.3)
Creatinine, Ser: 5.99 mg/dL — ABNORMAL HIGH (ref 0.44–1.00)
GFR calc non Af Amer: 7 mL/min — ABNORMAL LOW (ref >60)
GFR, EST AFRICAN AMERICAN: 9 mL/min — AB (ref >60)
Glucose, Bld: 110 mg/dL — ABNORMAL HIGH (ref 65–99)
Potassium: 5.5 mmol/L — ABNORMAL HIGH (ref 3.5–5.1)
Sodium: 136 mmol/L (ref 135–145)

## 2017-07-18 LAB — CBC
HEMATOCRIT: 15.7 % — AB (ref 36.0–46.0)
HEMOGLOBIN: 5 g/dL — AB (ref 12.0–15.0)
MCH: 30.1 pg (ref 26.0–34.0)
MCHC: 31.8 g/dL (ref 30.0–36.0)
MCV: 94.6 fL (ref 78.0–100.0)
Platelets: 125 10*3/uL — ABNORMAL LOW (ref 150–400)
RBC: 1.66 MIL/uL — ABNORMAL LOW (ref 3.87–5.11)
RDW: 14.5 % (ref 11.5–15.5)
WBC: 4.9 10*3/uL (ref 4.0–10.5)

## 2017-07-18 LAB — GLUCOSE, CAPILLARY
GLUCOSE-CAPILLARY: 197 mg/dL — AB (ref 65–99)
Glucose-Capillary: 185 mg/dL — ABNORMAL HIGH (ref 65–99)
Glucose-Capillary: 180 mg/dL — ABNORMAL HIGH (ref 65–99)
Glucose-Capillary: 144 mg/dL — ABNORMAL HIGH (ref 65–99)
Glucose-Capillary: 244 mg/dL — ABNORMAL HIGH (ref 65–99)

## 2017-07-18 LAB — HEPATITIS B SURFACE ANTIGEN: HEP B S AG: NEGATIVE

## 2017-07-18 MED ORDER — DARBEPOETIN ALFA 200 MCG/0.4ML IJ SOSY
PREFILLED_SYRINGE | INTRAMUSCULAR | Status: AC
  Administered 2017-07-18: 200 ug via INTRAVENOUS
  Filled 2017-07-18: qty 0.4

## 2017-07-18 NOTE — Progress Notes (Signed)
Pt has ordered "guest tray" paid for with her credit card. Pt has been educated that she is on clear liquid diet per MD. Pt states she hasn't eaten in 6 days and needs to "eat before she leaves". MD Nettey notified.

## 2017-07-18 NOTE — Progress Notes (Signed)
[facility] Progress Note  Subjective: feeling a little better  Vitals:   07/18/17 0415 07/18/17 0512 07/18/17 0735 07/18/17 1114  BP: (!) 99/59 (!) 160/77 139/60 (!) 160/83  Pulse: 87 89 88 93  Resp: (!) 22 (!) 24 (!) 30 (!) 23  Temp:  97.8 F (36.6 C) 98.5 F (36.9 C) 98.6 F (37 C)  TempSrc:  Oral  Oral  SpO2: 100% 95% 97% 99%  Weight:  63.8 kg (140 lb 10.5 oz)    Height:        Inpatient medications: . allopurinol  100 mg Oral Q T,Th,Sat-1800  . atorvastatin  40 mg Oral QHS  . calcium acetate  667 mg Oral TID WC  . darbepoetin (ARANESP) injection - DIALYSIS  200 mcg Intravenous Q Thu-HD  . feeding supplement (NEPRO CARB STEADY)  237 mL Oral TID BM  . hydrocortisone  25 mg Rectal BID  . insulin aspart  0-9 Units Subcutaneous TID WC  . mouth rinse  15 mL Mouth Rinse BID  . multivitamin  1 tablet Oral QHS  . pantoprazole  40 mg Oral Q0600  . sertraline  50 mg Oral Daily  . traZODone  25 mg Oral QHS  . vitamin C  500 mg Oral Daily   . sodium chloride    . sodium chloride    . ferric gluconate (FERRLECIT/NULECIT) IV Stopped (07/18/17 1054)   sodium chloride, sodium chloride, acetaminophen **OR** acetaminophen, albuterol, alteplase, heparin, lidocaine (PF), lidocaine-prilocaine, ondansetron **OR** ondansetron (ZOFRAN) IV, pentafluoroprop-tetrafluoroeth  Exam: Puffy face, no distress +jVD Chest mostly clear, occ rales RRR no mrg ABd soft ntnd no ascites Ext 1-2+ edema NF, Ox 3 Left AVG +bruit  Home meds: -norvasc 10/ coreg 25 bid/ clon 0.1 tid/ lasix 40 qd  -lantus 20 hs/ novolog 7 tid -zoloft 50 qd/ ativan 2 mg w HD/ oxy IR 10 mg qid prn -allopurinol 100 tts/ lipitor/ phoslo / fe SO4/ MVI/ nepro/ alb nebs  Dialysis: [facility] 3.5[facility]   2/2.25   63kg   Hep none  L AVG 400/800 -hect 1 ug -mircera 225 last 11/19 -Hb 8.2, tsat 25%, derr 1164, pth 243       Impression: 1. Uremia/ hyperkalemia - due to missed HD x 2.5 weeks. Better now.  2. Vol  overload - resolved, 1kg up today, 7L off yest 3. ESRD on HD TTS 4. HTN - hold BP meds for now while getting vol off; resume as needed 5. Anemia of CKD - pt refusing transfusion. Has max ESA ordered at OP unit, Fe tsat > 30% here.  No change in Rx.  Cont max ESA and Fe prn.  Pt educated that missing so much dialysis / esa is prob cause of worsening anemia.  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 Bipolar disorder 8. Hep C+ 9. Chronic pain syndrome 10. Hx substance abuse 11. DM with low BS - per primary 12. Dispo - stable for dc from renal standpoint  Plan - HD Sat 1st shift   Kelly Splinter MD [facility] pager [phone]   07/18/2017, 4:08 PM   Recent Labs  Lab 07/16/17 1338 07/17/17 0557 07/18/17 0611  NA 135 134* 136  K 6.3* 5.7* 5.5*  CL 75* 81* 92*  CO2 36* 30 27  GLUCOSE 55* 92 110*  BUN 111* 81* 46*  CREATININE 13.31* 9.66* 5.99*  CALCIUM 8.6* 8.5* 8.7*   Recent Labs  Lab 07/16/17 1338  AST 27  ALT 21  ALKPHOS 90  BILITOT 0.7  PROT 6.5  ALBUMIN 3.0*   Recent Labs  Lab 07/16/17 1338 07/17/17 0557 07/18/17 0611  WBC 3.1* 3.9* 4.9  NEUTROABS 2.0  --   --   HGB 4.6* 4.6* 5.0*  HCT 14.5* 14.6* 15.7*  MCV 92.9 93.6 94.6  PLT 85* 103* 125*   Iron/TIBC/Ferritin/ %Sat    Component Value Date/Time   IRON 96 07/16/2017 2315   TIBC 253 07/16/2017 2315   FERRITIN 3,123 (H) 07/16/2017 2315   IRONPCTSAT 38 (H) 07/16/2017 2315

## 2017-07-18 NOTE — Progress Notes (Signed)
HD tx completed @ 2575 w/o problem, pt challenged UF goal as she was 8.1kg over, original goal was for 2-3L off but pt c/o trouble breathing and knowing how far over her EDW she requested the challenge. I told her I would have to call the MD for that much over. Orders were changed to goal of 5L and pt still wanted to challenge more if her bp allowed. Pt was able to get 7.3L off and had a great tx., blood rinsed back, VSS, report called to Colan Neptune, RN

## 2017-07-18 NOTE — Progress Notes (Signed)
PROGRESS NOTE    [name]  BZJ:[id number] DOB: 1966/03/06 DOA: 07/16/2017 PCP: [name], MD   Brief Narrative: [name] is a 51 y.o. female who is a Jehovah Witness with medical history significant of ESRD, anemia, cocaine abuse and non-compliance. Patient presented with weakness and found to have a hemoglobin of 4.6 down from 9 one month ago. She has recurrent hematochezia. GI recommending no elective management.   Assessment & Plan:   Principal Problem:   Evaluation by psychiatric service required Active Problems:   Chronic combined systolic (congestive) and diastolic (congestive) heart failure (HCC)   Hyperkalemia   Cocaine use disorder, severe, dependence (HCC)   ESRD (end stage renal disease) ([place])   Anemia due to chronic kidney disease   Volume overload   Acute blood loss anemia Bright red blood per rectum Patient with GI bleeding. Gastroenterology consulted but cannot perform elective procedure secondary to severe anemia and patient's refusal for blood products. Had an episode of hematochezia yesterday. Patient is symptomatic. Patient adamantly refuses blood products even in the event of life threatening bleeding/anemia. Risks thoroughly explained to patient. She chooses to still be full code. Psychiatry agrees that patient has capacity. -CBC daily, strict in/out -GI recommendations: no EGD/colonoscopy secondary to severe anemia -Palliative care consult: plan for meeting this afternoon  Volume overload Uremia Hyperkalemia Secondary to missed dialysis. -Dialysis per nephrology  ESRD on HD -Hemodialysis per nephrology  Diabetes mellitus, type 2 -Continue SSI  ESRD on HD -Nephrology recommendations for dialysis  Essential hypertension Patient on clonidine as an outpatient. Has had some soft blood pressure. -monitor  Depression -Continue Zoloft  Acute respiratory failure with hypoxia Dyspnea Secondary to severe anemia. Patient refusing blood  transfusion -Wean O2 to room air as able    DVT prophylaxis: SCDs secondary to active bleeding Code Status: Full code Family Communication: None at bedside Disposition Plan: Discharge pending PT and palliative care. Anticipate discharge tomorrow   Consultants:   Gastroenterology  Palliative care medicine  Psychiatry  Procedures:   None  Antimicrobials:  None    Subjective: Feels better after dialysis. Mild dyspnea.   Objective: Vitals:   07/18/17 0415 07/18/17 0512 07/18/17 0735 07/18/17 1114  BP: (!) 99/59 (!) 160/77 139/60 (!) 160/83  Pulse: 87 89 88 93  Resp: (!) 22 (!) 24 (!) 30 (!) 23  Temp:  97.8 F (36.6 C) 98.5 F (36.9 C) 98.6 F (37 C)  TempSrc:  Oral  Oral  SpO2: 100% 95% 97% 99%  Weight:  63.8 kg (140 lb 10.5 oz)    Height:        Intake/Output Summary (Last 24 hours) at 07/18/2017 1125 Last data filed at 07/18/2017 0415 Gross per 24 hour  Intake 790 ml  Output 7300 ml  Net -6510 ml   Filed Weights   07/17/17 0045 07/18/17 0005 07/18/17 0512  Weight: 76.6 kg (168 lb 14 oz) 71.1 kg (156 lb 12 oz) 63.8 kg (140 lb 10.5 oz)    Examination:  General exam: Appears calm and comfortable Respiratory system: Clear to auscultation. Respiratory effort normal. Cardiovascular system: S1 & S2 heard, RRR. No murmurs, rubs, gallops or clicks. Gastrointestinal system: Abdomen is nondistended, soft and nontender. No organomegaly or masses felt. Normal bowel sounds heard. Central nervous system: Alert and oriented. No focal neurological deficits. Extremities: Trace LE edema. No calf tenderness Skin: No cyanosis. No rashes Psychiatry: Judgement and insight appear normal. Mood & affect appropriate.     Data Reviewed: I  have personally reviewed following labs and imaging studies  CBC: Recent Labs  Lab 07/16/17 1338 07/17/17 0557 07/18/17 0611  WBC 3.1* 3.9* 4.9  NEUTROABS 2.0  --   --   HGB 4.6* 4.6* 5.0*  HCT 14.5* 14.6* 15.7*  MCV 92.9 93.6  94.6  PLT 85* 103* 947*   Basic Metabolic Panel: Recent Labs  Lab 07/16/17 1338 07/17/17 0557 07/18/17 0611  NA 135 134* 136  K 6.3* 5.7* 5.5*  CL 75* 81* 92*  CO2 36* 30 27  GLUCOSE 55* 92 110*  BUN 111* 81* 46*  CREATININE 13.31* 9.66* 5.99*  CALCIUM 8.6* 8.5* 8.7*  MG 3.9*  --   --    GFR: Estimated Creatinine Clearance: 9.6 mL/min (A) (by C-G formula based on SCr of 5.99 mg/dL (H)). Liver Function Tests: Recent Labs  Lab 07/16/17 1338  AST 27  ALT 21  ALKPHOS 90  BILITOT 0.7  PROT 6.5  ALBUMIN 3.0*   No results for input(s): LIPASE, AMYLASE in the last 168 hours. No results for input(s): AMMONIA in the last 168 hours. Coagulation Profile: No results for input(s): INR, PROTIME in the last 168 hours. Cardiac Enzymes: No results for input(s): CKTOTAL, CKMB, CKMBINDEX, TROPONINI in the last 168 hours. BNP (last 3 results) No results for input(s): PROBNP in the last 8760 hours. HbA1C: No results for input(s): HGBA1C in the last 72 hours. CBG: Recent Labs  Lab 07/17/17 1646 07/17/17 1953 07/17/17 2224 07/18/17 0736 07/18/17 0940  GLUCAP 119* 160* 147* 185* 180*   Lipid Profile: No results for input(s): CHOL, HDL, LDLCALC, TRIG, CHOLHDL, LDLDIRECT in the last 72 hours. Thyroid Function Tests: No results for input(s): TSH, T4TOTAL, FREET4, T3FREE, THYROIDAB in the last 72 hours. Anemia Panel: Recent Labs    07/16/17 2315  FERRITIN 3,123*  TIBC 253  IRON 96   Sepsis Labs: No results for input(s): PROCALCITON, LATICACIDVEN in the last 168 hours.  Recent Results (from the past 240 hour(s))  MRSA PCR Screening     Status: None   Collection Time: 07/16/17  6:52 PM  Result Value Ref Range Status   MRSA by PCR NEGATIVE NEGATIVE Final    Comment:        The GeneXpert MRSA Assay (FDA approved for NASAL specimens only), is one component of a comprehensive MRSA colonization surveillance program. It is not intended to diagnose MRSA infection nor to guide  or monitor treatment for MRSA infections.          Radiology Studies: Dg Chest Port 1 View  Result Date: 07/16/2017 CLINICAL DATA:  Weakness and GI bleeding EXAM: PORTABLE CHEST 1 VIEW COMPARISON:  06/03/2017 FINDINGS: Cardiac shadow is enlarged but stable. Increased vascular congestion is noted. Bibasilar infiltrates are seen with associated effusions. No bony abnormality is noted. IMPRESSION: Increased vascular congestion. Bibasilar infiltrates left greater than right. Electronically Signed   By: Inez Catalina M.D.   On: 07/16/2017 15:08        Scheduled Meds: . allopurinol  100 mg Oral Q T,Th,Sat-1800  . atorvastatin  40 mg Oral QHS  . calcium acetate  667 mg Oral TID WC  . darbepoetin (ARANESP) injection - DIALYSIS  200 mcg Intravenous Q Thu-HD  . feeding supplement (NEPRO CARB STEADY)  237 mL Oral TID BM  . hydrocortisone  25 mg Rectal BID  . insulin aspart  0-9 Units Subcutaneous TID WC  . mouth rinse  15 mL Mouth Rinse BID  . multivitamin  1 tablet Oral  QHS  . pantoprazole  40 mg Oral Q0600  . sertraline  50 mg Oral Daily  . traZODone  25 mg Oral QHS  . vitamin C  500 mg Oral Daily   Continuous Infusions: . sodium chloride    . sodium chloride    . ferric gluconate (FERRLECIT/NULECIT) IV Stopped (07/18/17 1054)     LOS: 2 days     Cordelia Poche, MD [facility] 07/18/2017, 11:25 AM Pager: [phone]  If 7PM-7AM, please contact night-coverage [email or web address] Password [email or web address] 07/18/2017, 11:25 AM

## 2017-07-18 NOTE — Progress Notes (Signed)
Attempted to wean patient to room air from 2L of O2; patient sats dropped to low 80s. Unable to wean O2 at this time.

## 2017-07-18 NOTE — Progress Notes (Signed)
  GI UPDATE:  Hgb of 5.0 noted today. See my detailed note from yesterday regarding her case. She has declined blood transfusion, psychiatry has evaluated and deemed she is appropriate to make this decision. I have discussed her case with multiple anesthesia providers, no anesthesia is recommended given her level of anemia other than a situation of life threatening active hemorrhage if she does not have a blood transfusion, given risks of cardiovascular collapse. Thus, while EGD and colonoscopy is recommended, her anemia is prohibiting these exams. I would continue IV PPI for now empirically and continue anusol for hemorrhoids which were noted on her flex sig 6 months ago, which may be the source of her bleeding symptoms. Her anemia is otherwise likely multifactorial as previously outlined. On IV iron and aranesp.  If she changes her mind and wishes to have a blood transfusion, please contact me so we can consider endoscopic evaluation. Otherwise we will sign off for now, call if any evidence of obvious GI hemorrhage moving forward.  Pecos Cellar, MD [facility] Pager [phone]

## 2017-07-18 NOTE — Progress Notes (Signed)
HD tx initiated via 15Gx2 w/o problem, pull/push/flush equally w/o problem, VSS, will cont to monitor while on HD tx 

## 2017-07-18 NOTE — Progress Notes (Signed)
MD Schertz notified of patient request to be dialyzed again before planned discharge on 07/19/17.

## 2017-07-18 NOTE — Progress Notes (Signed)
Pt c/o 10/10 pain at IV site at [facility]. Pt refuse to let RN assess for patency. Site appears clean, dry, intact. Pt requests RN to remove before she removes herself. IV Team consult ordered for new site.

## 2017-07-18 NOTE — Consult Note (Signed)
Consultation Note Date: 07/18/2017   Patient Name: [name]  DOB: [birth year]  MRN: [id number]  Age / Sex: 51 y.o., female  PCP: [name], MD Referring Physician: Mariel Aloe, MD  Reason for Consultation: Establishing goals of care  HPI/Patient Profile: 51 y.o. female  with past medical history of chronic hepatitis C, ESRD on HD, anemia, cocaine abuse, HTN, CHF, noncompliance, T2DM, TIA, bipolar disorder, schizoaffective disorder, anxiety, and depression admitted on 07/16/2017 with chest tightness, lower extremity edema, and dark stools. Hgb found to be 4.6, K 6.3 and creatinine 13.31 on admission. Occult blood positive. Patient was also hypoxic and required oxygen via nasal cannula. Patient refuses blood products d/t religion.  Patient had stopped dialysis for 2-3 weeks d/t poor quality of life and was using herbal remedies to attempt to manage her ESRD. Per GI, hgb too low for GI diagnostic procedures unless patient experiencing life threatening hemorrhage.   Clinical Assessment and Goals of Care: I reviewed medical records, received report from team, assessed the patient, and then met at the patient's bedside to discuss diagnosis, prognosis, GOC, EOL wishes, disposition, and options.  Psych saw patient yesterday and determined she has capacity to make healthcare decisions.  Had scheduled meeting with patient and son; however, son did not come to meeting so held meeting with patient only.  Attempted to discuss patient's hospital course and diseases - she continued to avoid questions and only tell me that she needs antibiotics and treatment for vertigo. I discussed with her that her dizziness may likely be d/t her anemia.    She tells me she plans to continue hemodialysis when she is discharged this time. She still believes her herbal remedies and helpful for her ESRD and will continue them as well.  I asked her if she would like her  son included in these conversations and she states "he is verbally abusive and not a good support person". She tells me she is trying to move out of the home she shares with him and move in with her bible study leader.   We discussed the severity of her anemia and she tells me she understands it is life threatening. Also confirms she would not want blood products to ever be given, despite anemia severity. I discussed code status with her and she tells me she would like a natural death - would not want resuscitation efforts. She would like [place] to continue to treat the treatable, but if she were so sick that her heart were to stop or she could not breathe without ventilator support she would want to pass away naturally.   Questions and concerns addressed. Emotional support was provided.  Primary Decision Maker PATIENT    SUMMARY OF RECOMMENDATIONS   -Changed code status to DNR per patient's wishes -Continue to treat the treatable, confirmed no blood products to be given, patient states she understands severity of anemia - Pt desires to continue dialysis outpatient -not hospice eligible with desire to continue HD  Code Status/Advance Care Planning:  DNR   Symptom Management:   Per primary team  Palliative Prophylaxis:   Frequent Pain Assessment  Additional Recommendations (Limitations, Scope, Preferences):  Full Scope Treatment - DNR  Psycho-social/Spiritual:   Desire for further Chaplaincy support:no  Additional Recommendations: Education on Hospice  Prognosis:   Unable to determine  Discharge Planning: Home      Primary Diagnoses: Present on Admission: . Hyperkalemia . Anemia due to chronic kidney disease . Chronic combined systolic (congestive) and  diastolic (congestive) heart failure ([place]) . Cocaine use disorder, severe, dependence ([place]) . ESRD (end stage renal disease) ([place]) . Volume overload   I have reviewed the medical record, interviewed the patient and  family, and examined the patient. The following aspects are pertinent.  Past Medical History:  Diagnosis Date  . Anemia   . Anxiety   . Bipolar disorder, unspecified ([place]) 02/06/2014  . Chronic combined systolic and diastolic CHF (congestive heart failure) ([facility])    EF 40% by echo and 48% by stress test  . Chronic pain syndrome   . Depression   . ESRD on hemodialysis ([place])   . GERD (gastroesophageal reflux disease)   . Glaucoma   . Headache    sinus headaches  . Hepatitis C   . High cholesterol   . History of hiatal hernia   . History of vitamin D deficiency 06/20/2015  . Hypertension   . MRSA infection ?2014   "on the back of my head; spread throughout my bloodstream"  . Neuropathy   . Refusal of blood transfusions as patient is Jehovah's Witness   . Schizoaffective disorder, unspecified condition 02/08/2014  . Seizure [facility]) dx'd 2014   "don't know what kind; last one was ~ 04/2014"  . Stroke [facility])    TIA 2009  . TIA (transient ischemic attack) 2010  . Type II diabetes mellitus ([place])    INSULIN DEPENDENT   Social History   Socioeconomic History  . Marital status: Single    Spouse name: None  . Number of children: 3  . Years of education: 37  . Highest education level: 12th grade  Social Needs  . Financial resource strain: None  . Food insecurity - worry: None  . Food insecurity - inability: None  . Transportation needs - medical: None  . Transportation needs - non-medical: None  Occupational History  . Occupation: disabled  Tobacco Use  . Smoking status: Former Smoker    Packs/day: 1.00    Years: 20.00    Pack years: 20.00    Types: Cigarettes    Last attempt to quit: 03/31/2015    Years since quitting: 2.3  . Smokeless tobacco: Never Used  . Tobacco comment: "stopped smoking in ~ 2013; restarted last week; stopped again 06/17/2014"  Substance and Sexual Activity  . Alcohol use: Yes    Alcohol/week: 1.8 oz    Types: 3 Standard drinks or equivalent per week     Comment: 05/01/15 patient denies alcohol use.  . Drug use: Yes    Types: "Crack" cocaine, Cocaine    Comment: pt reports last use June 2018  . Sexual activity: Not Currently    Birth control/protection: Abstinence  Other Topics Concern  . None  Social History Narrative   ** Merged History Encounter **   Single, lives with son in a one story home. Unable to exercise. Avoids caffeine. 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       Family History  Problem Relation Age of Onset  . Hypertension Mother   . Diabetes Mother   . Hypertension Father   . Diabetes Father    Scheduled Meds: . allopurinol  100 mg Oral Q T,Th,Sat-1800  . atorvastatin  40 mg Oral QHS  . calcium acetate  667 mg Oral TID WC  . darbepoetin (ARANESP) injection - DIALYSIS  200 mcg Intravenous Q Thu-HD  . feeding supplement (NEPRO CARB STEADY)  237 mL Oral TID BM  . hydrocortisone  25 mg Rectal BID  . insulin aspart  0-9 Units Subcutaneous TID WC  . mouth rinse  15 mL Mouth Rinse BID  . multivitamin  1 tablet Oral QHS  . pantoprazole  40 mg Oral Q0600  . sertraline  50 mg Oral Daily  . traZODone  25 mg Oral QHS  . vitamin C  500 mg Oral Daily   Continuous Infusions: . sodium chloride    . sodium chloride    . ferric gluconate (FERRLECIT/NULECIT) IV Stopped (07/18/17 1054)   PRN Meds:.sodium chloride, sodium chloride, acetaminophen **OR** acetaminophen, albuterol, alteplase, heparin, lidocaine (PF), lidocaine-prilocaine, ondansetron **OR** ondansetron (ZOFRAN) IV, pentafluoroprop-tetrafluoroeth Allergies  Allergen Reactions  . Penicillins Hives and Other (See Comments)    Has patient had a PCN reaction causing immediate rash, facial/tongue/throat swelling, SOB or lightheadedness with hypotension: Yes Has patient had a PCN reaction causing severe rash involving mucus membranes or skin necrosis: No Has patient had a PCN reaction that required hospitalization No Has  patient had a PCN reaction occurring within the last 10 years: No If all of the above answers are "NO", then may proceed with Cephalosporin use. Pt tolerates cefepime and ceftriaxone  . Gabapentin Itching and Swelling  . Lyrica [Pregabalin] Swelling and Other (See Comments)    Facial and leg swelling  . Saphris [Asenapine] Swelling and Other (See Comments)    Facial swelling  . Tramadol Swelling and Other (See Comments)    Leg swelling  . Other Other (See Comments)    NO BLOOD PRODUCTS. PT IS A JEHOVAH WITNESS   Review of Systems  Constitutional: Positive for activity change and fatigue.  Respiratory: Positive for cough. Negative for shortness of breath.   Cardiovascular: Negative for chest pain and leg swelling.  Musculoskeletal:       Left rib pain  Neurological: Positive for dizziness and weakness.    Physical Exam  Constitutional: She is oriented to person, place, and time. No distress.  HENT:  Head: Normocephalic and atraumatic.  Cardiovascular: Normal rate and intact distal pulses.  Pulmonary/Chest: Effort normal and breath sounds normal. No respiratory distress.  Abdominal: Soft. There is no tenderness.  Musculoskeletal: She exhibits no edema.  Neurological: She is alert and oriented to person, place, and time.  Skin: Skin is warm and dry. She is not diaphoretic.  Psychiatric: She has a normal mood and affect. Her speech is normal and behavior is normal. Cognition and memory are normal.    Vital Signs: BP (!) 160/83 (BP Location: Right Arm)   Pulse 93   Temp 98.6 F (37 C) (Oral)   Resp (!) 23   Ht '5\' 4"'$  (1.626 m)   Wt 63.8 kg (140 lb 10.5 oz) Comment: took off 7.3L w/ HD tx  LMP  (LMP Unknown)   SpO2 99%   BMI 24.14 kg/m  Pain Assessment: 0-10   Pain Score: 10-Worst pain ever   SpO2: SpO2: 99 % O2 Device:SpO2: 99 % O2 Flow Rate: .O2 Flow Rate (L/min): 2 L/min  IO: Intake/output summary:   Intake/Output Summary (Last 24 hours) at 07/18/2017 1541 Last  data filed at 07/18/2017 0415 Gross per 24 hour  Intake 790 ml  Output 7300 ml  Net -6510 ml    LBM: Last BM Date: 07/16/17 Baseline Weight: Weight: 77.6 kg (171 lb 1.2 oz) Most recent weight: Weight: 63.8 kg (140 lb 10.5 oz)(took off 7.3L w/ HD tx)     Palliative Assessment/Data: 70%     Time Total: 60 minutes  Greater than 50%  of this time was spent counseling and coordinating care related to the above assessment and plan.  Juel Burrow, DNP, AGNP-C Palliative Medicine Team [phone]

## 2017-07-18 NOTE — Progress Notes (Addendum)
MD paged regarding the following:  Patient request DME before dc: rollater, nebulizer machine. Patient request medications be added for PRN use: Ativan or something like it for anxiety and any medication to assist with itching.  Patient was provided with lotion for dry skin at time of medication request.  Case management reported to this RN that patient advised she was scared to have oxygen at home since son smokes in home.

## 2017-07-19 DIAGNOSIS — R531 Weakness: Secondary | ICD-10-CM

## 2017-07-19 DIAGNOSIS — K921 Melena: Principal | ICD-10-CM

## 2017-07-19 LAB — RENAL FUNCTION PANEL
ANION GAP: 13 (ref 5–15)
Albumin: 2.8 g/dL — ABNORMAL LOW (ref 3.5–5.0)
BUN: 47 mg/dL — ABNORMAL HIGH (ref 6–20)
CALCIUM: 8.4 mg/dL — AB (ref 8.9–10.3)
CHLORIDE: 89 mmol/L — AB (ref 101–111)
CO2: 31 mmol/L (ref 22–32)
Creatinine, Ser: 7.1 mg/dL — ABNORMAL HIGH (ref 0.44–1.00)
GFR calc non Af Amer: 6 mL/min — ABNORMAL LOW (ref >60)
GFR, EST AFRICAN AMERICAN: 7 mL/min — AB (ref >60)
Glucose, Bld: 274 mg/dL — ABNORMAL HIGH (ref 65–99)
POTASSIUM: 4.5 mmol/L (ref 3.5–5.1)
Phosphorus: 4.5 mg/dL (ref 2.5–4.6)
Sodium: 133 mmol/L — ABNORMAL LOW (ref 135–145)

## 2017-07-19 LAB — HEPATITIS B SURFACE ANTIBODY,QUALITATIVE: HEP B S AB: NONREACTIVE

## 2017-07-19 LAB — GLUCOSE, CAPILLARY
GLUCOSE-CAPILLARY: 255 mg/dL — AB (ref 65–99)
GLUCOSE-CAPILLARY: 282 mg/dL — AB (ref 65–99)
Glucose-Capillary: 204 mg/dL — ABNORMAL HIGH (ref 65–99)

## 2017-07-19 LAB — HEPATITIS B SURFACE ANTIGEN: HEP B S AG: NEGATIVE

## 2017-07-19 MED ORDER — FERROUS SULFATE ER 142 (45 FE) MG PO TBCR: 1.0000 | EXTENDED_RELEASE_TABLET | Freq: Every day | ORAL | 0 refills | Status: DC

## 2017-07-19 MED ORDER — LIDOCAINE HCL (PF) 1 % IJ SOLN: 5.0000 mL | INTRAMUSCULAR | Status: DC | PRN

## 2017-07-19 MED ORDER — CARVEDILOL 25 MG PO TABS
25.0000 mg | ORAL_TABLET | Freq: Two times a day (BID) | ORAL | Status: DC
  Administered 2017-07-19: 25 mg via ORAL
  Filled 2017-07-19: qty 1

## 2017-07-19 MED ORDER — LIDOCAINE-PRILOCAINE 2.5-2.5 % EX CREA
1.0000 "application " | TOPICAL_CREAM | CUTANEOUS | Status: DC | PRN
  Filled 2017-07-19: qty 5

## 2017-07-19 MED ORDER — OXYCODONE HCL 10 MG PO TABS: 10.0000 mg | ORAL_TABLET | Freq: Four times a day (QID) | ORAL | 0 refills | Status: DC | PRN

## 2017-07-19 MED ORDER — HYDRALAZINE HCL 20 MG/ML IJ SOLN
5.0000 mg | Freq: Once | INTRAMUSCULAR | Status: AC
  Administered 2017-07-19: 5 mg via INTRAVENOUS
  Filled 2017-07-19: qty 1

## 2017-07-19 MED ORDER — SENNOSIDES-DOCUSATE SODIUM 8.6-50 MG PO TABS
2.0000 | ORAL_TABLET | Freq: Once | ORAL | Status: AC
  Administered 2017-07-19: 2 via ORAL
  Filled 2017-07-19: qty 2

## 2017-07-19 MED ORDER — CLONIDINE HCL 0.1 MG PO TABS
0.1000 mg | ORAL_TABLET | Freq: Three times a day (TID) | ORAL | Status: DC
  Administered 2017-07-19: 0.1 mg via ORAL
  Filled 2017-07-19: qty 1

## 2017-07-19 MED ORDER — PENTAFLUOROPROP-TETRAFLUOROETH EX AERO: 1.0000 "application " | INHALATION_SPRAY | CUTANEOUS | Status: DC | PRN

## 2017-07-19 MED ORDER — SODIUM CHLORIDE 0.9 % IV SOLN: 100.0000 mL | INTRAVENOUS | Status: DC | PRN

## 2017-07-19 MED ORDER — AMLODIPINE BESYLATE 10 MG PO TABS: 10.0000 mg | ORAL_TABLET | Freq: Every day | ORAL | Status: DC

## 2017-07-19 MED ORDER — LORAZEPAM 0.5 MG PO TABS: 0.5000 mg | ORAL_TABLET | Freq: Three times a day (TID) | ORAL | 0 refills | Status: DC | PRN

## 2017-07-19 MED ORDER — SERTRALINE HCL 50 MG PO TABS: 50.0000 mg | ORAL_TABLET | Freq: Every day | ORAL | 0 refills | Status: DC

## 2017-07-19 MED ORDER — TRAZODONE HCL 50 MG PO TABS: 25.0000 mg | ORAL_TABLET | Freq: Every day | ORAL | 0 refills | Status: DC

## 2017-07-19 MED ORDER — HEPARIN SODIUM (PORCINE) 1000 UNIT/ML DIALYSIS
1000.0000 [IU] | INTRAMUSCULAR | Status: DC | PRN
  Filled 2017-07-19: qty 1

## 2017-07-19 MED ORDER — PANTOPRAZOLE SODIUM 40 MG PO TBEC: 40.0000 mg | DELAYED_RELEASE_TABLET | Freq: Every day | ORAL | 0 refills | Status: DC

## 2017-07-19 NOTE — Progress Notes (Signed)
Pt back from dialysis. Refusing tele leads, refusing assistance w/ getting dressed/moving to chair. This RN explained dangers of pt ambulating around room on own due to low H/H and potential dizziness. Pt still refusing

## 2017-07-19 NOTE — Progress Notes (Signed)
PT Cancellation Note  Patient Details Name: [name] MRN: [id number] DOB: 1965-11-08   Cancelled Treatment:    Reason Eval/Treat Not Completed: Patient at procedure or test/unavailable. Pt off of the floor for HD. PT will continue to f/u with pt as available.   [place] 07/19/2017, 8:26 AM

## 2017-07-19 NOTE — Progress Notes (Signed)
Pt agreeable to wearing tele leads for now. States dizzy when walking, yet still getting up on own despite multiple attempts to educate pt on danger of getting up w/out assistance. This RN has explained that given pts low H/H and dizziness pt could easily fall and injure herself.  RN has instructed pt to call if she needs to get up for anything

## 2017-07-19 NOTE — Discharge Summary (Signed)
Physician Discharge Summary  [name] BTD:[id number] DOB: [birth year] DOA: 07/16/2017  PCP: [name], MD  Admit date: 07/16/2017 Discharge date: 07/20/2017  Admitted From: Home Disposition: Home  Recommendations for Outpatient Follow-up:  1. Follow up with PCP in 1 week 2. Please obtain BMP/CBC in one week 3. Please follow up on the following pending results: None  Home Health: RN, PT, OT Equipment/Devices: Rolling walker with seat, nebulizer  Discharge Condition: Guarded CODE STATUS: DNR Diet recommendation: Renal diet   Brief/Interim Summary:  Admission HPI written by Geradine Girt, DO   Chief Complaint: weakness  HPI: [name] is a 51 y.o. female who is a Jehovah Witness with medical history significant of ESRD, anemia, cocaine abuse and non-compliance.  Patient has not been to dialysis in 2 week as she decided to manage her ESRD with herbal medicine including activated charcoal, black charcoal, etc).  But she called EMS today because she felt overall fatigue and weakness.  Patient reports chest tightness, LE edema and dark stools.    She denies cocaine use in last few months.  No SI or HI.  In the ER, her K was found to be elevated.  Her Hgb was 4--- she refused transfusion as she is Jehovah Witness.  She was fecal occult positive.  GI consulted and renal consulted as well.  Plan is for HD later today.  Plan with GI is to start IV PPI.    Hospital course:  Acute blood loss anemia Bright red blood per rectum Patient with GI bleeding. Gastroenterology consulted but cannot perform elective procedure secondary to severe anemia and patient's refusal for blood products. Patient is symptomatic. Patient adamantly refuses blood products even in the event of life threatening bleeding/anemia. Risks thoroughly explained to patient. Psychiatry consulted and patient has capacity. Palliative care consulted and patient made DNR. Patient not hospice eligible as she still  chooses to have hemodialysis. Anticipate frequent visits to ED secondary to her severe and symptomatic anemia. Although patient had an episode of hematochezia early in the course, this resolved and she had a non-bloody stool prior to discharge. If she changes her mind with regard to blood transfusion, would recommend EGD and colonoscopy.  Volume overload Uremia Hyperkalemia Secondary to missed dialysis. Dialysis per nephrology  ESRD on HD Hemodialysis per nephrology  Diabetes mellitus, type 2 Sliding scale insulin while inpatient.Continue home regimen on discharge.  Essential hypertension Patient on clonidine as an outpatient. Has had some soft blood pressure but eventually back hypertensive. Resume home medications.  Depression Continued Zoloft  Acute respiratory failure with hypoxia Dyspnea Secondary to severe anemia. Patient refusing blood transfusion. Weaned to room air. Ambulated with PT and did not qualify for home oxygen.    Discharge Diagnoses:  Principal Problem:   Evaluation by psychiatric service required Active Problems:   Chronic combined systolic (congestive) and diastolic (congestive) heart failure (HCC)   Hyperkalemia   Cocaine use disorder, severe, dependence (HCC)   End stage renal disease (HCC)   Anemia due to chronic kidney disease   Volume overload   Lower GI bleed    Discharge Instructions  Discharge Instructions    Increase activity slowly   Complete by:  As directed      Allergies as of 07/19/2017      Reactions   Penicillins Hives, Other (See Comments)   Has patient had a PCN reaction causing immediate rash, facial/tongue/throat swelling, SOB or lightheadedness with hypotension: Yes Has patient had a PCN reaction causing severe  rash involving mucus membranes or skin necrosis: No Has patient had a PCN reaction that required hospitalization No Has patient had a PCN reaction occurring within the last 10 years: No If all of the above  answers are "NO", then may proceed with Cephalosporin use. Pt tolerates cefepime and ceftriaxone   Gabapentin Itching, Swelling   Lyrica [pregabalin] Swelling, Other (See Comments)   Facial and leg swelling   Saphris [asenapine] Swelling, Other (See Comments)   Facial swelling   Tramadol Swelling, Other (See Comments)   Leg swelling   Other Other (See Comments)   NO BLOOD PRODUCTS. PT IS A JEHOVAH WITNESS      Medication List    STOP taking these medications   furosemide 40 MG tablet Commonly known as:  LASIX     TAKE these medications   albuterol 108 (90 Base) MCG/ACT inhaler Commonly known as:  PROVENTIL HFA;VENTOLIN HFA Inhale 2 puffs into the lungs every 4 (four) hours as needed for wheezing or shortness of breath.   allopurinol 100 MG tablet Commonly known as:  ZYLOPRIM Take 1 tablet (100 mg total) by mouth every Tuesday, Thursday, and Saturday at 6 PM.   amLODipine 10 MG tablet Commonly known as:  NORVASC Take 1 tablet (10 mg total) by mouth at bedtime.   atorvastatin 40 MG tablet Commonly known as:  LIPITOR Take 1 tablet (40 mg total) by mouth at bedtime.   blood glucose meter kit and supplies Kit Dispense based on patient and insurance preference. Use up to four times daily as directed. (FOR ICD-9 250.00, 250.01).   calcium acetate 667 MG capsule Commonly known as:  PHOSLO Take 2 capsules (1,334 mg total) by mouth 3 (three) times daily with meals. What changed:  how much to take   carvedilol 25 MG tablet Commonly known as:  COREG Take 1 tablet (25 mg total) by mouth 2 (two) times daily with a meal.   cloNIDine 0.1 MG tablet Commonly known as:  CATAPRES Take 1 tablet (0.1 mg total) by mouth every 8 (eight) hours.   diphenhydrAMINE 25 mg capsule Commonly known as:  BENADRYL Take 25 mg by mouth every 6 (six) hours as needed for itching. Patient is given one capsule on dialysis days   feeding supplement (NEPRO CARB STEADY) Liqd Take 237 mLs by mouth 3  (three) times daily between meals.   Ferrous Sulfate 142 (45 Fe) MG Tbcr Commonly known as:  SLOW FE Take 1 tablet by mouth daily. What changed:    medication strength  how much to take   hydrocortisone 2.5 % rectal cream Commonly known as:  ANUSOL-HC Place rectally 2 (two) times daily. What changed:    how much to take  when to take this  reasons to take this   insulin aspart 100 UNIT/ML injection Commonly known as:  novoLOG Inject 7 Units into the skin 3 (three) times daily with meals.   insulin glargine 100 UNIT/ML injection Commonly known as:  LANTUS Inject 0.2 mLs (20 Units total) into the skin at bedtime.   LORazepam 0.5 MG tablet Commonly known as:  ATIVAN Take 1 tablet (0.5 mg total) by mouth every 8 (eight) hours as needed for anxiety.   multivitamin Tabs tablet Take 1 tablet by mouth at bedtime.   mupirocin ointment 2 % Commonly known as:  BACTROBAN Apply 1 application topically daily.   Oxycodone HCl 10 MG Tabs Take 1 tablet (10 mg total) by mouth every 6 (six) hours as needed.  pantoprazole 40 MG tablet Commonly known as:  PROTONIX Take 1 tablet (40 mg total) by mouth daily at 6 (six) AM.   sertraline 50 MG tablet Commonly known as:  ZOLOFT Take 1 tablet (50 mg total) by mouth daily.   traZODone 50 MG tablet Commonly known as:  DESYREL Take 0.5 tablets (25 mg total) by mouth at bedtime.   vitamin C 500 MG tablet Commonly known as:  ASCORBIC ACID Take 500 mg by mouth daily.      Follow-up Information    [name], MD. Schedule an appointment as soon as possible for a visit in 1 week(s).   Specialty:  Internal Medicine Contact information: [address] [place] [address] [place] Follow up.   Why:  Go on Monday morning to pick up nebulizer and rollator (rolling walker with seat). Contact information: [address] [place] [address] [phone]        [facility].   Specialty:  Home Health Services Why:  Physical therapy, occupational therapy, and registered nurse will contact you to schedule visits. Contact information: [place] [address] [phone]          Allergies  Allergen Reactions  . Penicillins Hives and Other (See Comments)    Has patient had a PCN reaction causing immediate rash, facial/tongue/throat swelling, SOB or lightheadedness with hypotension: Yes Has patient had a PCN reaction causing severe rash involving mucus membranes or skin necrosis: No Has patient had a PCN reaction that required hospitalization No Has patient had a PCN reaction occurring within the last 10 years: No If all of the above answers are "NO", then may proceed with Cephalosporin use. Pt tolerates cefepime and ceftriaxone  . Gabapentin Itching and Swelling  . Lyrica [Pregabalin] Swelling and Other (See Comments)    Facial and leg swelling  . Saphris [Asenapine] Swelling and Other (See Comments)    Facial swelling  . Tramadol Swelling and Other (See Comments)    Leg swelling  . Other Other (See Comments)    NO BLOOD PRODUCTS. PT IS A [place]    Consultations:  Gastroenterology  Palliative care medicine   Procedures/Studies: Dg Chest Port 1 View  Result Date: 07/16/2017 CLINICAL DATA:  Weakness and GI bleeding EXAM: PORTABLE CHEST 1 VIEW COMPARISON:  06/03/2017 FINDINGS: Cardiac shadow is enlarged but stable. Increased vascular congestion is noted. Bibasilar infiltrates are seen with associated effusions. No bony abnormality is noted. IMPRESSION: Increased vascular congestion. Bibasilar infiltrates left greater than right. Electronically Signed   By: Inez Catalina M.D.   On: 07/16/2017 15:08     Subjective: Dizziness/lightheadedness  Discharge Exam: Vitals:   07/19/17 1800 07/19/17 1900  BP: (!) 151/63 (!) 167/70  Pulse: (!) 101 100  Resp: 18 12  Temp:    SpO2: 93% 100%    Vitals:   07/19/17 1741 07/19/17 1742 07/19/17 1800 07/19/17 1900  BP: (!) 168/73  (!) 151/63 (!) 167/70  Pulse: 95  (!) 101 100  Resp: _0 Temp:      TempSrc:      SpO2: 99% 99% 93% 100%  Weight:      Height:        General: Pt is alert, awake, not in acute distress Cardiovascular: RRR, S1/S2 +, no rubs, no gallops Respiratory: CTA bilaterally, no wheezing, no rhonchi Abdominal: Soft, NT, ND, bowel sounds +  Extremities: no edema, no cyanosis    The results of significant diagnostics from this hospitalization (including imaging, microbiology, ancillary and laboratory) are listed below for reference.     Microbiology: Recent Results (from the past 240 hour(s))  MRSA PCR Screening     Status: None   Collection Time: 07/16/17  6:52 PM  Result Value Ref Range Status   MRSA by PCR NEGATIVE NEGATIVE Final    Comment:        The GeneXpert MRSA Assay (FDA approved for NASAL specimens only), is one component of a comprehensive MRSA colonization surveillance program. It is not intended to diagnose MRSA infection nor to guide or monitor treatment for MRSA infections.      Labs: BNP (last 3 results) Recent Labs    12/31/16 2350 06/03/17 1213  BNP 4,207.8* 5,597.4*   Basic Metabolic Panel: Recent Labs  Lab 07/16/17 1338 07/17/17 0557 07/18/17 0611 07/19/17 0833 07/20/17 0248  NA 135 134* 136 133* 136  K 6.3* 5.7* 5.5* 4.5 4.3  CL 75* 81* 92* 89* 96*  CO2 36* _0 GLUCOSE 55* 92 110* 274* 253*  BUN 111* 81* 46* 47* 27*  CREATININE 13.31* 9.66* 5.99* 7.10* 4.80*  CALCIUM 8.6* 8.5* 8.7* 8.4* 8.7*  MG 3.9*  --   --   --   --   PHOS  --   --   --  4.5  --    Liver Function Tests: Recent Labs  Lab 07/16/17 1338 07/19/17 0833  AST 27  --   ALT 21  --   ALKPHOS 90  --   BILITOT 0.7  --   PROT 6.5  --   ALBUMIN 3.0* 2.8*   No results for input(s): LIPASE, AMYLASE in the last 168 hours. No results for input(s): AMMONIA in the last 168  hours. CBC: Recent Labs  Lab 07/16/17 1338 07/17/17 0557 07/18/17 0611 07/20/17 0248  WBC 3.1* 3.9* 4.9 4.8  NEUTROABS 2.0  --   --   --   HGB 4.6* 4.6* 5.0* 4.4*  HCT 14.5* 14.6* 15.7* 14.0*  MCV 92.9 93.6 94.6 98.6  PLT 85* 103* 125* 90*   Cardiac Enzymes: No results for input(s): CKTOTAL, CKMB, CKMBINDEX, TROPONINI in the last 168 hours. BNP: Invalid input(s): POCBNP CBG: Recent Labs  Lab 07/18/17 1850 07/18/17 2106 07/19/17 1258 07/19/17 1637 07/19/17 2010  GLUCAP 197* 244* 204* 255* 282*   D-Dimer No results for input(s): DDIMER in the last 72 hours. Hgb A1c No results for input(s): HGBA1C in the last 72 hours. Lipid Profile No results for input(s): CHOL, HDL, LDLCALC, TRIG, CHOLHDL, LDLDIRECT in the last 72 hours. Thyroid function studies No results for input(s): TSH, T4TOTAL, T3FREE, THYROIDAB in the last 72 hours.  Invalid input(s): FREET3 Anemia work up No results for input(s): VITAMINB12, FOLATE, FERRITIN, TIBC, IRON, RETICCTPCT in the last 72 hours. Urinalysis    Component Value Date/Time   COLORURINE YELLOW 02/25/2017 1224   APPEARANCEUR HAZY (A) 02/25/2017 1224   LABSPEC 1.013 02/25/2017 1224   PHURINE 8.0 02/25/2017 1224   GLUCOSEU >=500 (A) 02/25/2017 1224   HGBUR SMALL (A) 02/25/2017 1224   BILIRUBINUR NEGATIVE 02/25/2017 1224   BILIRUBINUR neg 12/13/2014 1127   KETONESUR NEGATIVE 02/25/2017 1224   PROTEINUR >=300 (A) 02/25/2017 1224   UROBILINOGEN 0.2 05/01/2015 1252   NITRITE NEGATIVE 02/25/2017 1224   LEUKOCYTESUR NEGATIVE 02/25/2017 1224   Sepsis Labs Invalid input(s): PROCALCITONIN,  WBC,  LACTICIDVEN Microbiology Recent Results (from  the past 240 hour(s))  MRSA PCR Screening     Status: None   Collection Time: 07/16/17  6:52 PM  Result Value Ref Range Status   MRSA by PCR NEGATIVE NEGATIVE Final    Comment:        The GeneXpert MRSA Assay (FDA approved for NASAL specimens only), is one component of a comprehensive MRSA  colonization surveillance program. It is not intended to diagnose MRSA infection nor to guide or monitor treatment for MRSA infections.      Time coordinating discharge: Over 30 minutes  SIGNED:   Cordelia Poche, MD [facility] 07/20/2017, 4:20 PM Pager [phone]  If 7PM-7AM, please contact night-coverage [email or web address] Password [email or web address]

## 2017-07-19 NOTE — Progress Notes (Signed)
[facility] Progress Note  Subjective: had HD this am, wants to go home  Vitals:   07/19/17 1015 07/19/17 1045 07/19/17 1100 07/19/17 1112  BP: (!) 168/83 (!) 163/83 (!) 162/82 (!) 166/79  Pulse: 97 100 99 98  Resp:    19  Temp:    98.1 F (36.7 C)  TempSrc:    Oral  SpO2:    100%  Weight:    68.8 kg (151 lb 10.8 oz)  Height:        Inpatient medications: . allopurinol  100 mg Oral Q T,Th,Sat-1800  . atorvastatin  40 mg Oral QHS  . calcium acetate  667 mg Oral TID WC  . darbepoetin (ARANESP) injection - DIALYSIS  200 mcg Intravenous Q Thu-HD  . feeding supplement (NEPRO CARB STEADY)  237 mL Oral TID BM  . hydrocortisone  25 mg Rectal BID  . insulin aspart  0-9 Units Subcutaneous TID WC  . mouth rinse  15 mL Mouth Rinse BID  . multivitamin  1 tablet Oral QHS  . pantoprazole  40 mg Oral Q0600  . sertraline  50 mg Oral Daily  . traZODone  25 mg Oral QHS  . vitamin C  500 mg Oral Daily   . sodium chloride    . sodium chloride    . sodium chloride    . sodium chloride    . ferric gluconate (FERRLECIT/NULECIT) IV 125 mg (07/19/17 1019)   sodium chloride, sodium chloride, sodium chloride, sodium chloride, acetaminophen **OR** acetaminophen, albuterol, heparin, lidocaine (PF), lidocaine-prilocaine, ondansetron **OR** ondansetron (ZOFRAN) IV, pentafluoroprop-tetrafluoroeth  Exam: Alert, no distress Chest clear bilat RRR no mrg ABd soft ntnd no ascites Ext no LE edema NF, Ox 3 Left AVG +bruit  Home meds: -norvasc 10/ coreg 25 bid/ clon 0.1 tid/ lasix 40 qd  -lantus 20 hs/ novolog 7 tid -zoloft 50 qd/ ativan 2 mg w HD/ oxy IR 10 mg qid prn -allopurinol 100 tts/ lipitor/ phoslo / fe SO4/ MVI/ nepro/ alb nebs  D[facility]: [facility]   2/2.25   63kg   Hep none  L AVG 400/800 -hect 1 ug -mircera 225 last 11/19 -Hb 8.2, tsat 25%, derr 1164, pth 243       Impression: 1. Uremia/ hyperkalemia - due to missed HD x 2.5 weeks. Resolved.  2. Vol overload -  mostly resolved, then regained fluid wt in hospital 3. ESRD on HD TTS 4. HTN - BP's up, have restarted BP meds that she was on at home 5. Anemia of CKD - pt refusing transfusion. Hb 5- 6 range.  Has max ESA ordered at OP unit, Fe tsat > 30% here.  No change in Rx.  Cont max ESA and Fe prn.  Pt educated that missing so much dialysis / esa is prob cause of worsening anemia.  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 Bipolar disorder 8. Hep C+ 9. Chronic pain syndrome 10. Hx substance abuse 11. DM with low BS - per primary 12. Dispo - stable for dc from renal standpoint  Plan - HD today, ok for dc after HD   Kelly Splinter MD [place] pager [phone]   07/19/2017, 12:42 PM   Recent Labs  Lab 07/17/17 0557 07/18/17 0611 07/19/17 0833  NA 134* 136 133*  K 5.7* 5.5* 4.5  CL 81* 92* 89*  CO2 30 27 31   GLUCOSE 92 110* 274*  BUN 81* 46* 47*  CREATININE 9.66* 5.99* 7.10*  CALCIUM 8.5* 8.7*  8.4*  PHOS  --   --  4.5   Recent Labs  Lab 07/16/17 1338 07/19/17 0833  AST 27  --   ALT 21  --   ALKPHOS 90  --   BILITOT 0.7  --   PROT 6.5  --   ALBUMIN 3.0* 2.8*   Recent Labs  Lab 07/16/17 1338 07/17/17 0557 07/18/17 0611  WBC 3.1* 3.9* 4.9  NEUTROABS 2.0  --   --   HGB 4.6* 4.6* 5.0*  HCT 14.5* 14.6* 15.7*  MCV 92.9 93.6 94.6  PLT 85* 103* 125*   Iron/TIBC/Ferritin/ %Sat    Component Value Date/Time   IRON 96 07/16/2017 2315   TIBC 253 07/16/2017 2315   FERRITIN 3,123 (H) 07/16/2017 2315   IRONPCTSAT 38 (H) 07/16/2017 2315

## 2017-07-19 NOTE — Progress Notes (Signed)
Messaged attending MD re: PT has seen pt and will put note in. Pt very concerned regarding ativan rx at d/c.

## 2017-07-19 NOTE — Care Management Note (Signed)
Case Management Note  Patient Details  Name: AKIRRA LACERDA MRN: [id number] Date of Birth: 1965-10-13  Subjective/Objective:        Pt presented for anemia.  Requests PT, OT, and RN to help with medications.  Pt does not have W/C and does not want one.  Pt requests rollator and needs nebulizer.          Action/Plan: [facility] unable to deliver equipment to room.  Requests that pt go to store on Monday morning to obtain equipment.  Pt states she is able to do so and can get through tomorrow without nebulizer.  Orders placed for [facility] RN, OT, PT.  [facility] will start care within 24-48 hours.  Expected Discharge Date:  07/19/17               Expected Discharge Plan:  Home/Self Care  In-House Referral:  NA  Discharge planning Services  CM Consult  Post Acute Care Choice:  Durable Medical Equipment, Home Health Choice offered to:  Patient  DME Arranged:  [facility] platform, Nebulizer/meds DME Agency:  [place] Arranged:  RN, PT, OT [facility] Agency:  [place]  Status of Service:  Completed, signed off  If discussed at [place] of Stay Meetings, dates discussed:    Additional Comments:  Arley Phenix, RN 07/19/2017, 5:19 PM

## 2017-07-19 NOTE — Progress Notes (Signed)
[facility] Progress Note  Subjective: no c/o  Vitals:   07/19/17 0900 07/19/17 0930 07/19/17 1000 07/19/17 1015  BP: (!) 166/81 (!) 167/83 (!) 165/84 (!) 168/83  Pulse: 95 96 97 97  Resp:      Temp:      TempSrc:      SpO2:      Weight:      Height:        Inpatient medications: . allopurinol  100 mg Oral Q T,Th,Sat-1800  . atorvastatin  40 mg Oral QHS  . calcium acetate  667 mg Oral TID WC  . darbepoetin (ARANESP) injection - DIALYSIS  200 mcg Intravenous Q Thu-HD  . feeding supplement (NEPRO CARB STEADY)  237 mL Oral TID BM  . hydrocortisone  25 mg Rectal BID  . insulin aspart  0-9 Units Subcutaneous TID WC  . mouth rinse  15 mL Mouth Rinse BID  . multivitamin  1 tablet Oral QHS  . pantoprazole  40 mg Oral Q0600  . sertraline  50 mg Oral Daily  . traZODone  25 mg Oral QHS  . vitamin C  500 mg Oral Daily   . sodium chloride    . sodium chloride    . sodium chloride    . sodium chloride    . ferric gluconate (FERRLECIT/NULECIT) IV 125 mg (07/19/17 1019)   sodium chloride, sodium chloride, sodium chloride, sodium chloride, acetaminophen **OR** acetaminophen, albuterol, heparin, lidocaine (PF), lidocaine-prilocaine, ondansetron **OR** ondansetron (ZOFRAN) IV, pentafluoroprop-tetrafluoroeth  Exam: Puffy face, no distress +jVD Chest mostly clear, occ rales RRR no mrg ABd soft ntnd no ascites Ext 1-2+ edema NF, Ox 3 Left AVG +bruit  Home meds: -norvasc 10/ coreg 25 bid/ clon 0.1 tid/ lasix 40 qd  -lantus 20 hs/ novolog 7 tid -zoloft 50 qd/ ativan 2 mg w HD/ oxy IR 10 mg qid prn -allopurinol 100 tts/ lipitor/ phoslo / fe SO4/ MVI/ nepro/ alb nebs  [facility]   2/2.25   63kg   Hep none  L AVG 400/800 -hect 1 ug -mircera 225 last 11/19 -Hb 8.2, tsat 25%, derr 1164, pth 243       Impression: 1. Uremia/ hyperkalemia - resolved 2. Vol overload - resolved 3. ESRD on HD TTS 4. HTN - holding BP meds (NV/ coreg/ clon) for HD purposes,  these can be resumed at dc 5. Anemia of CKD - pt refusing transfusion. Has max ESA ordered at OP unit, Fe tsat > 30% here.  No change in Rx.  Cont max ESA and Fe prn. 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 Bipolar disorder 8. Hep C+ 9. Chronic pain syndrome 10. Hx substance abuse 11. DM with low BS - per primary 12. Dispo - stable for dc from renal standpoint  Plan - HD this am   Kelly Splinter MD [facility] pager [phone]   07/19/2017, 10:42 AM   Recent Labs  Lab 07/17/17 0557 07/18/17 0611 07/19/17 0833  NA 134* 136 133*  K 5.7* 5.5* 4.5  CL 81* 92* 89*  CO2 30 27 31   GLUCOSE 92 110* 274*  BUN 81* 46* 47*  CREATININE 9.66* 5.99* 7.10*  CALCIUM 8.5* 8.7* 8.4*  PHOS  --   --  4.5   Recent Labs  Lab 07/16/17 1338 07/19/17 0833  AST 27  --   ALT 21  --   ALKPHOS 90  --   BILITOT 0.7  --   PROT 6.5  --  ALBUMIN 3.0* 2.8*   Recent Labs  Lab 07/16/17 1338 07/17/17 0557 07/18/17 0611  WBC 3.1* 3.9* 4.9  NEUTROABS 2.0  --   --   HGB 4.6* 4.6* 5.0*  HCT 14.5* 14.6* 15.7*  MCV 92.9 93.6 94.6  PLT 85* 103* 125*   Iron/TIBC/Ferritin/ %Sat    Component Value Date/Time   IRON 96 07/16/2017 2315   TIBC 253 07/16/2017 2315   FERRITIN 3,123 (H) 07/16/2017 2315   IRONPCTSAT 38 (H) 07/16/2017 2315

## 2017-07-19 NOTE — Discharge Instructions (Signed)
Gastrointestinal Bleeding °Gastrointestinal bleeding is bleeding somewhere along the path food travels through the body (digestive tract). This path is anywhere between the mouth and the opening of the butt (anus). You may have blood in your poop (stools) or have black poop. If you throw up (vomit), there may be blood in it. °This condition can be mild, serious, or even life-threatening. If you have a lot of bleeding, you may need to stay in the hospital. °Follow these instructions at home: °· Take over-the-counter and prescription medicines only as told by your doctor. °· Eat foods that have a lot of fiber in them. These foods include whole grains, fruits, and vegetables. You can also try eating 1-3 prunes each day. °· Drink enough fluid to keep your pee (urine) clear or pale yellow. °· Keep all follow-up visits as told by your doctor. This is important. °Contact a doctor if: °· Your symptoms do not get better. °Get help right away if: °· Your bleeding gets worse. °· You feel dizzy or you pass out (faint). °· You feel weak. °· You have very bad cramps in your back or belly (abdomen). °· You pass large clumps of blood (clots) in your poop. °· Your symptoms are getting worse. °This information is not intended to replace advice given to you by your health care provider. Make sure you discuss any questions you have with your health care provider. °Document Released: 04/30/2008 Document Revised: 12/28/2015 Document Reviewed: 01/09/2015 °Elsevier Interactive Patient Education © 2018 [employer] ° °

## 2017-07-19 NOTE — Progress Notes (Signed)
Information provided to pt. for discharge. Transported with belongings to exit in wheelchair by nurse tech.

## 2017-07-19 NOTE — Progress Notes (Signed)
SATURATION QUALIFICATIONS: (This note is used to comply with regulatory documentation for home oxygen)  Patient Saturations on Room Air at Rest = 97%  Patient Saturations on Room Air while Ambulating = 93%  Patient Saturations on n/a Liters of oxygen while Ambulating = n/a  Please briefly explain why patient needs home oxygen: Patient does NOT desaturate on RA with ambulation. Pt's SPO2 maintained >90% throughout on RA with all activity.    [place], [place], [place] [phone]

## 2017-07-19 NOTE — Evaluation (Signed)
Physical Therapy Evaluation Patient Details Name: [name] MRN: [id number] DOB: [birth year] Today's Date: 07/19/2017   History of Present Illness  Pt is a 51 y/o female admitted after missing two of her dialysis appointments. Pt found to have a Hgb of 5.0. PMH including but not limited to Bipolar disorder, DM, CVA, CHF, Hep C, ESRD, HTN, cocaine abuse, L midfoot amputation in 2014.  Clinical Impression  Pt presented OOB in recliner chair, awake and willing to participate in therapy session. Prior to admission, pt reported that she ambulates with use of RW and requires assistance with ADLs. Pt lives with her son, who works during the day. She gets transportation to and from HD. Pt performed transfers with min guard for safety with and without RW, and ambulated a short distance in hallway with use of RW and min guard for safety. Pt limited secondary to dizziness and fatigue. Pt on RA throughout session with SPO2 maintaining >90% throughout. Pt would continue to benefit from skilled physical therapy services at this time while admitted and after d/c to address the below listed limitations in order to improve overall safety and independence with functional mobility.     Follow Up Recommendations Home health PT;Supervision/Assistance - 24 hour    Equipment Recommendations  Other (comment)(rollator)    Recommendations for Other Services       Precautions / Restrictions Precautions Precautions: Fall Restrictions Weight Bearing Restrictions: No      Mobility  Bed Mobility               General bed mobility comments: pt OOB in recliner chair upon arrival  Transfers Overall transfer level: Needs assistance Equipment used: Rolling walker (2 wheeled);None Transfers: Sit to/from Stand Sit to Stand: Min guard         General transfer comment: for safety  Ambulation/Gait Ambulation/Gait assistance: Min guard Ambulation Distance (Feet): 50 Feet Assistive device: Rolling  walker (2 wheeled) Gait Pattern/deviations: Step-through pattern;Decreased step length - left;Decreased step length - right;Decreased stride length Gait velocity: decreased Gait velocity interpretation: Below normal speed for age/gender General Gait Details: pt required frequent standing rest breaks secondary to fatigue, close min guard for safety with use of RW. Pt was steady with RW.  Stairs            Wheelchair Mobility    Modified Rankin (Stroke Patients Only)       Balance Overall balance assessment: Needs assistance Sitting-balance support: Feet supported Sitting balance-Leahy Scale: Fair     Standing balance support: During functional activity;Single extremity supported Standing balance-Leahy Scale: Poor Standing balance comment: reliant on at least one UE support                             Pertinent Vitals/Pain Pain Assessment: 0-10 Pain Score: 10-Worst pain ever Pain Location: back Pain Descriptors / Indicators: Spasm Pain Intervention(s): Monitored during session;Repositioned    Home Living Family/patient expects to be discharged to:: Private residence Living Arrangements: Children Available Help at Discharge: Family;Available PRN/intermittently Type of Home: House Home Access: Stairs to enter Entrance Stairs-Rails: Psychiatric nurse of Steps: 5 Home Layout: One level Home Equipment: Walker - 2 wheels      Prior Function Level of Independence: Needs assistance   Gait / Transfers Assistance Needed: ambulates with RW  ADL's / Homemaking Assistance Needed: requires assistance from son        Hand Dominance   Dominant Hand: Right  Extremity/Trunk Assessment   Upper Extremity Assessment Upper Extremity Assessment: Generalized weakness    Lower Extremity Assessment Lower Extremity Assessment: Generalized weakness       Communication   Communication: No difficulties  Cognition Arousal/Alertness:  Awake/alert Behavior During Therapy: Flat affect Overall Cognitive Status: Impaired/Different from baseline Area of Impairment: Safety/judgement;Awareness;Problem solving                         Safety/Judgement: Decreased awareness of safety;Decreased awareness of deficits Awareness: Emergent Problem Solving: Requires verbal cues;Requires tactile cues        General Comments      Exercises     Assessment/Plan    PT Assessment Patient needs continued PT services  PT Problem List Decreased strength;Decreased activity tolerance;Decreased mobility;Decreased balance;Decreased coordination;Decreased cognition;Decreased knowledge of use of DME;Decreased safety awareness;Decreased knowledge of precautions       PT Treatment Interventions DME instruction;Gait training;Stair training;Functional mobility training;Balance training;Therapeutic activities;Therapeutic exercise;Cognitive remediation;Neuromuscular re-education;Patient/family education    PT Goals (Current goals can be found in the Care Plan section)  Acute Rehab PT Goals Patient Stated Goal: to get her vertigo treated PT Goal Formulation: With patient Time For Goal Achievement: 08/02/17 Potential to Achieve Goals: Good    Frequency Min 3X/week   Barriers to discharge        Co-evaluation               AM-PAC PT "6 Clicks" Daily Activity  Outcome Measure Difficulty turning over in bed (including adjusting bedclothes, sheets and blankets)?: A Lot Difficulty moving from lying on back to sitting on the side of the bed? : A Lot Difficulty sitting down on and standing up from a chair with arms (e.g., wheelchair, bedside commode, etc,.)?: A Little Help needed moving to and from a bed to chair (including a wheelchair)?: A Little Help needed walking in hospital room?: A Little Help needed climbing 3-5 steps with a railing? : Total 6 Click Score: 14    End of Session Equipment Utilized During Treatment:  Gait belt Activity Tolerance: Patient limited by fatigue Patient left: in chair;with call [place]/phone within reach;with SCD's reapplied Nurse Communication: Mobility status PT Visit Diagnosis: Other abnormalities of gait and mobility (R26.89);Muscle weakness (generalized) (M62.81)    Time: 7342-8768 PT Time Calculation (min) (ACUTE ONLY): 15 min   Charges:   PT Evaluation $PT Eval Moderate Complexity: 1 Mod     PT G Codes:        [employer], PT, DPT [employer] 07/19/2017, 3:49 PM

## 2017-07-20 ENCOUNTER — Emergency Department (HOSPITAL_COMMUNITY)
Admission: EM | Admit: 2017-07-20 | Discharge: 2017-07-20 | Disposition: A | Discharge status: Home / Self Care | Payer: Medicare Other | Attending: Emergency Medicine | Admitting: Emergency Medicine

## 2017-07-20 ENCOUNTER — Encounter (HOSPITAL_COMMUNITY): Payer: Self-pay

## 2017-07-20 DIAGNOSIS — D649 Anemia, unspecified: Secondary | ICD-10-CM | POA: Diagnosis not present

## 2017-07-20 DIAGNOSIS — R42 Dizziness and giddiness: Secondary | ICD-10-CM | POA: Insufficient documentation

## 2017-07-20 DIAGNOSIS — E119 Type 2 diabetes mellitus without complications: Secondary | ICD-10-CM | POA: Insufficient documentation

## 2017-07-20 DIAGNOSIS — Z992 Dependence on renal dialysis: Secondary | ICD-10-CM

## 2017-07-20 DIAGNOSIS — D500 Iron deficiency anemia secondary to blood loss (chronic): Secondary | ICD-10-CM

## 2017-07-20 DIAGNOSIS — Z87891 Personal history of nicotine dependence: Secondary | ICD-10-CM | POA: Diagnosis not present

## 2017-07-20 DIAGNOSIS — Z794 Long term (current) use of insulin: Secondary | ICD-10-CM | POA: Insufficient documentation

## 2017-07-20 DIAGNOSIS — N186 End stage renal disease: Secondary | ICD-10-CM

## 2017-07-20 DIAGNOSIS — I132 Hypertensive heart and chronic kidney disease with heart failure and with stage 5 chronic kidney disease, or end stage renal disease: Secondary | ICD-10-CM | POA: Diagnosis not present

## 2017-07-20 DIAGNOSIS — I5042 Chronic combined systolic (congestive) and diastolic (congestive) heart failure: Secondary | ICD-10-CM | POA: Diagnosis not present

## 2017-07-20 LAB — I-STAT TROPONIN, ED: TROPONIN I, POC: 0.02 ng/mL (ref 0.00–0.08)

## 2017-07-20 LAB — CBC
HCT: 14 % — ABNORMAL LOW (ref 36.0–46.0)
Hemoglobin: 4.4 g/dL — CL (ref 12.0–15.0)
MCH: 31 pg (ref 26.0–34.0)
MCHC: 31.4 g/dL (ref 30.0–36.0)
MCV: 98.6 fL (ref 78.0–100.0)
PLATELETS: 90 10*3/uL — AB (ref 150–400)
RBC: 1.42 MIL/uL — AB (ref 3.87–5.11)
RDW: 15.2 % (ref 11.5–15.5)
WBC: 4.8 10*3/uL (ref 4.0–10.5)

## 2017-07-20 LAB — BASIC METABOLIC PANEL
Anion gap: 12 (ref 5–15)
BUN: 27 mg/dL — ABNORMAL HIGH (ref 6–20)
CALCIUM: 8.7 mg/dL — AB (ref 8.9–10.3)
CO2: 28 mmol/L (ref 22–32)
CREATININE: 4.8 mg/dL — AB (ref 0.44–1.00)
Chloride: 96 mmol/L — ABNORMAL LOW (ref 101–111)
GFR calc non Af Amer: 10 mL/min — ABNORMAL LOW (ref >60)
GFR, EST AFRICAN AMERICAN: 11 mL/min — AB (ref >60)
Glucose, Bld: 253 mg/dL — ABNORMAL HIGH (ref 65–99)
Potassium: 4.3 mmol/L (ref 3.5–5.1)
SODIUM: 136 mmol/L (ref 135–145)

## 2017-07-20 LAB — I-STAT BETA HCG BLOOD, ED (MC, WL, AP ONLY)

## 2017-07-20 NOTE — ED Notes (Signed)
Pt oxygen dropped to 88%, and pt stated "I think I need oxygen" 2L [place] applied

## 2017-07-20 NOTE — ED Provider Notes (Signed)
[facility] EMERGENCY DEPARTMENT Provider Note   CSN: [id number] Arrival date & time: 07/20/17  0232     History   Chief Complaint Chief Complaint  Patient presents with  . Dizziness  . Chest Pain    HPI [name] is a 51 y.o. female.  HPI Patient is a 51 year old female with end-stage renal disease who is to see presents the emergency department complaining of lightheadedness and generalized weakness.  She was discharged from the hospital yesterday where she was found to be anemic secondary to likely lower GI bleed.  At that time her hemoglobin was found to be 4.6.  She is a Sales promotion account executive Witness and therefore will not accept blood products.  She again states that she will not nor ever will accept blood products.  She states that when she stands she feels lightheaded.  During her acute hospitalization GI was involved but was unable to perform endoscopy or colonoscopy secondary to inability for anesthesia to sedate the patient given the nonemergent condition as she is at high risk for cardiovascular collapse upon induction given her severe anemia.  Palliative care consultation was obtained because prior to her hospitalization she had not had given up hope and was no longer planning on dialysis.  She has since changed her mind and agrees to dialysis.  She did have dialysis on Friday.  She denies weakness of her arms or legs.  No significant headache at this time.  Dizzy and lightheaded when standing.  No recent fevers or chills.    Past Medical History:  Diagnosis Date  . Anemia   . Anxiety   . Bipolar disorder, unspecified ([place]) 02/06/2014  . Chronic combined systolic and diastolic CHF (congestive heart failure) (HCC)    EF 40% by echo and 48% by stress test  . Chronic pain syndrome   . Depression   . ESRD on hemodialysis ([place])   . GERD (gastroesophageal reflux disease)   . Glaucoma   . Headache    sinus headaches  . Hepatitis C   . High cholesterol   . History  of hiatal hernia   . History of vitamin D deficiency 06/20/2015  . Hypertension   . MRSA infection ?2014   "on the back of my head; spread throughout my bloodstream"  . Neuropathy   . Refusal of blood transfusions as patient is Jehovah's Witness   . Schizoaffective disorder, unspecified condition 02/08/2014  . Seizure [facility]) dx'd 2014   "don't know what kind; last one was ~ 04/2014"  . Stroke [facility])    TIA 2009  . TIA (transient ischemic attack) 2010  . Type II diabetes mellitus ([place])    INSULIN DEPENDENT    Patient Active Problem List   Diagnosis Date Noted  . Lower GI bleed   . Evaluation by psychiatric service required 07/17/2017  . Volume overload 06/03/2017  . Hyperglycemia 06/03/2017  . Right supracondylar humerus fracture, with nonunion, subsequent encounter 05/01/2017  . Malnutrition of moderate degree 02/28/2017  . Leg pain, left 02/25/2017  . Left leg pain 02/25/2017  . External hemorrhoids   . Diverticulosis of colon without hemorrhage   . Goals of care, counseling/discussion   . Palliative care encounter   . Cellulitis of left leg 01/16/2017  . Diabetes mellitus with complication ([place]) 73/41/9379  . Upper GI bleed 01/16/2017  . UGIB (upper gastrointestinal bleed)   . Anemia due to chronic kidney disease   . Cellulitis of left thigh 01/01/2017  . End stage  renal disease ([place]) 01/01/2017  . Cocaine use disorder, severe, dependence ([place]) 08/29/2016  . Hepatitis C 08/29/2016  . Diabetic ulcer of calf ([place]) 08/29/2016  . Diabetic ulcer of toe ([place]) 08/29/2016  . Cerebrovascular accident (CVA) ([place]) 08/29/2016  . MDD (major depressive disorder) 08/28/2016  . Dyslipidemia associated with type 2 diabetes mellitus ([place]) 11/10/2015  . Hyperkalemia 09/17/2015  . History of vitamin D deficiency 06/20/2015  . Long term prescription opiate use 06/20/2015  . Chronic pain syndrome 06/19/2015  . Anemia of chronic kidney failure 05/01/2015  . transmetatarsal amputation of the  left foot 12/13/2014  . Unilateral visual loss 11/22/2014  . 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 ([place]) 11/22/2014  . Essential hypertension 11/09/2014  . Dizziness   . Chronic combined systolic (congestive) and diastolic (congestive) heart failure ([place])   . Rectal bleeding 04/23/2014  . Anemia, iron deficiency 04/13/2014  . Noncompliance 02/02/2014  . Hyponatremia 05/19/2013  . CKD (chronic kidney disease) stage 5, GFR less than 15 ml/min (HCC) 05/18/2013  . Seizure disorder ([place]) 02/09/2013    Past Surgical History:  Procedure Laterality Date  . ABDOMINAL HYSTERECTOMY  2014  . ABDOMINAL HYSTERECTOMY  2010  . AMPUTATION Left 05/21/2013   Procedure: Left Midfoot AMPUTATION;  Surgeon: Newt Minion, MD;  Location: [facility];  Service: Orthopedics;  Laterality: Left;  Left Midfoot Amputation  . AV FISTULA PLACEMENT Left 05/04/2015   Procedure: ARTERIOVENOUS (AV) GRAFT INSERTION;  Surgeon: Angelia Mould, MD;  Location: [facility];  Service: Vascular;  Laterality: Left;  . COLONOSCOPY WITH PROPOFOL N/A 06/22/2013   Procedure: COLONOSCOPY WITH PROPOFOL;  Surgeon: Jeryl Columbia, MD;  Location: [facility] ENDOSCOPY;  Service: Endoscopy;  Laterality: N/A;  . ESOPHAGOGASTRODUODENOSCOPY N/A 05/11/2015   Procedure: ESOPHAGOGASTRODUODENOSCOPY (EGD);  Surgeon: Laurence Spates, MD;  Location[facility] ENDOSCOPY;  Service: Endoscopy;  Laterality: N/A;  . EYE SURGERY Bilateral 2010   Lasik  . FLEXIBLE SIGMOIDOSCOPY N/A 01/17/2017   Procedure: FLEXIBLE SIGMOIDOSCOPY w/ hemorrhoid banding possible;  Surgeon: Gatha Mayer, MD;  Location: [facility] ENDOSCOPY;  Service: Endoscopy;  Laterality: N/A;  . FOOT AMPUTATION Left    due diabetic neuropathy, could not feel ulcer on bottom of foot  . REFRACTIVE SURGERY Bilateral 2013  . TONSILLECTOMY  1970's  . TUBAL LIGATION  2000    OB History    Gravida Para Term Preterm AB Living   0 0 0 0 0     SAB TAB Ectopic Multiple Live Births   0 0 0           Home  Medications    Prior to Admission medications   Medication Sig Start Date End Date Taking? Authorizing Provider  albuterol (PROVENTIL HFA;VENTOLIN HFA) 108 (90 Base) MCG/ACT inhaler Inhale 2 puffs into the lungs every 4 (four) hours as needed for wheezing or shortness of breath. 10/15/16   Merryl Hacker, MD  allopurinol (ZYLOPRIM) 100 MG tablet Take 1 tablet (100 mg total) by mouth every Tuesday, Thursday, and Saturday at 6 PM. 01/25/17   Nita Sells, MD  amLODipine (NORVASC) 10 MG tablet Take 1 tablet (10 mg total) by mouth at bedtime. 09/02/16   Hildred Priest, MD  atorvastatin (LIPITOR) 40 MG tablet Take 1 tablet (40 mg total) by mouth at bedtime. 09/02/16   Hildred Priest, MD  blood glucose meter kit and supplies KIT Dispense based on patient and insurance preference. Use up to four times daily as directed. (FOR ICD-9 250.00, 250.01). 06/07/17   Helberg,  Larkin Ina, MD  calcium acetate (PHOSLO) 667 MG capsule Take 2 capsules (1,334 mg total) by mouth 3 (three) times daily with meals. Patient taking differently: Take 667 mg by mouth 3 (three) times daily with meals.  09/02/16   Hildred Priest, MD  carvedilol (COREG) 25 MG tablet Take 1 tablet (25 mg total) by mouth 2 (two) times daily with a meal. 09/02/16   Hildred Priest, MD  cloNIDine (CATAPRES) 0.1 MG tablet Take 1 tablet (0.1 mg total) by mouth every 8 (eight) hours. 09/02/16   Hildred Priest, MD  diphenhydrAMINE (BENADRYL) 25 mg capsule Take 25 mg by mouth every 6 (six) hours as needed for itching. Patient is given one capsule on dialysis days    [provider]  Ferrous Sulfate (SLOW FE) 142 (45 Fe) MG TBCR Take 1 tablet by mouth daily. 07/19/17   Mariel Aloe, MD  hydrocortisone (ANUSOL-HC) 2.5 % rectal cream Place rectally 2 (two) times daily. Patient taking differently: Place 1 application rectally daily as needed.  01/24/17   Nita Sells, MD  insulin  aspart (NOVOLOG) 100 UNIT/ML injection Inject 7 Units into the skin 3 (three) times daily with meals. 06/07/17   Ina Homes, MD  insulin glargine (LANTUS) 100 UNIT/ML injection Inject 0.2 mLs (20 Units total) into the skin at bedtime. 06/07/17   Helberg, Larkin Ina, MD  LORazepam (ATIVAN) 0.5 MG tablet Take 1 tablet (0.5 mg total) by mouth every 8 (eight) hours as needed for anxiety. 07/19/17   Mariel Aloe, MD  multivitamin (RENA-VIT) TABS tablet Take 1 tablet by mouth at bedtime. 09/02/16   Hildred Priest, MD  mupirocin ointment (BACTROBAN) 2 % Apply 1 application topically daily. 03/06/17   [provider]  Nutritional Supplements (FEEDING SUPPLEMENT, NEPRO CARB STEADY,) LIQD Take 237 mLs by mouth 3 (three) times daily between meals. 02/28/17   Mikhail, Velta Addison, DO  Oxycodone HCl 10 MG TABS Take 1 tablet (10 mg total) by mouth every 6 (six) hours as needed. 07/19/17   Mariel Aloe, MD  pantoprazole (PROTONIX) 40 MG tablet Take 1 tablet (40 mg total) by mouth daily at 6 (six) AM. 07/20/17   Mariel Aloe, MD  sertraline (ZOLOFT) 50 MG tablet Take 1 tablet (50 mg total) by mouth daily. 07/19/17   Mariel Aloe, MD  traZODone (DESYREL) 50 MG tablet Take 0.5 tablets (25 mg total) by mouth at bedtime. 07/19/17   Mariel Aloe, MD  vitamin C (ASCORBIC ACID) 500 MG tablet Take 500 mg by mouth daily.    [provider]    Family History Family History  Problem Relation Age of Onset  . Hypertension Mother   . Diabetes Mother   . Hypertension Father   . Diabetes Father     Social History Social History   Tobacco Use  . Smoking status: Former Smoker    Packs/day: 1.00    Years: 20.00    Pack years: 20.00    Types: Cigarettes    Last attempt to quit: 03/31/2015    Years since quitting: 2.3  . Smokeless tobacco: Never Used  . Tobacco comment: "stopped smoking in ~ 2013; restarted last week; stopped again 06/17/2014"  Substance Use Topics  . Alcohol use:  Yes    Alcohol/week: 1.8 oz    Types: 3 Standard drinks or equivalent per week    Comment: 05/01/15 patient denies alcohol use.  . Drug use: Yes    Types: "Crack" cocaine, Cocaine    Comment: pt reports  last use June 2018     Allergies   Penicillins; Gabapentin; Lyrica [pregabalin]; Saphris [asenapine]; Tramadol; and Other   Review of Systems Review of Systems  All other systems reviewed and are negative.    Physical Exam Updated Vital Signs BP (!) 145/64   Pulse 79   Temp 98.8 F (37.1 C) (Oral)   Resp 14   Ht _0  (1.626 m)   Wt 68.5 kg (151 lb)   LMP  (LMP Unknown)   SpO2 100%   BMI 25.92 kg/m   Physical Exam  Constitutional: She is oriented to person, place, and time. She appears well-developed and well-nourished. No distress.  Pale  HENT:  Head: Normocephalic and atraumatic.  Eyes: EOM are normal.  Neck: Normal range of motion.  Cardiovascular: Normal rate and regular rhythm.  Pulmonary/Chest: Effort normal and breath sounds normal.  Abdominal: Soft. She exhibits no distension. There is no tenderness.  Musculoskeletal: Normal range of motion.  Neurological: She is alert and oriented to person, place, and time.  Skin: Skin is warm.  Psychiatric: She has a normal mood and affect. Her behavior is normal. Judgment and thought content normal.  Nursing note and vitals reviewed.    ED Treatments / Results  Labs (all labs ordered are listed, but only abnormal results are displayed) Labs Reviewed  BASIC METABOLIC PANEL - Abnormal; Notable for the following components:      Result Value   Chloride 96 (*)    Glucose, Bld 253 (*)    BUN 27 (*)    Creatinine, Ser 4.80 (*)    Calcium 8.7 (*)    GFR calc non Af Amer 10 (*)    GFR calc Af Amer 11 (*)    All other components within normal limits  CBC - Abnormal; Notable for the following components:   RBC 1.42 (*)    Hemoglobin 4.4 (*)    HCT 14.0 (*)    Platelets 90 (*)    All other components within normal  limits  I-STAT TROPONIN, ED  I-STAT BETA HCG BLOOD, ED ([facility], [facility], [facility] ONLY)   Hemoglobin  Date Value Ref Range Status  07/20/2017 4.4 (LL) 12.0 - 15.0 g/dL Final    Comment:    REPEATED TO VERIFY SPECIMEN CHECKED FOR CLOTS CRITICAL RESULT CALLED TO, READ BACK BY AND VERIFIED WITH: P. OBAMA,RN 0320 07/20/2017 T. TYSOR   07/18/2017 5.0 (LL) 12.0 - 15.0 g/dL Final    Comment:    REPEATED TO VERIFY CRITICAL VALUE NOTED.  VALUE IS CONSISTENT WITH PREVIOUSLY REPORTED AND CALLED VALUE.   07/17/2017 4.6 (LL) 12.0 - 15.0 g/dL Final    Comment:    REPEATED TO VERIFY CRITICAL VALUE NOTED.  VALUE IS CONSISTENT WITH PREVIOUSLY REPORTED AND CALLED VALUE.   07/16/2017 4.6 (LL) 12.0 - 15.0 g/dL Final    Comment:    REPEATED TO VERIFY CRITICAL RESULT CALLED TO, READ BACK BY AND VERIFIED WITH: NOTIFIED [place] RN [id number] 07/16/17 BY A BENNETT    HGB  Date Value Ref Range Status  07/20/2013 9.3 (L) 11.6 - 15.9 g/dL Final    EKG  EKG Interpretation None       Radiology No results found.  Procedures Procedures (including critical care time)  Medications Ordered in ED Medications - No data to display   Initial Impression / Assessment and Plan / ED Course  I have reviewed the triage vital signs and the nursing notes.  Pertinent labs & imaging results that were available  during my care of the patient were reviewed by me and considered in my medical decision making (see chart for details).     Patient continues to be symptomatic from her anemia.  She again reiterates that she will not receive blood products.  She is not a candidate for endoscopy or colonoscopy.  She received iron transfusions during her acute hospitalization.  I do not think she will benefit from any additional inpatient workup at this time as it is somewhat clear where her lightheadedness is coming from.  Her hemoglobin is 4.6 today.  She understands the severity of this anemia and the fact it is causing her symptoms.   She however believes that with modern medicine there has to be an alternative thing we can do to increase her blood counts besides blood transfusion.  I recommended that she follow-up closely with her primary care physician as well as outpatient GI team.  She is welcome to return to the emergency department at anytime for any new or worsening symptoms.  If she ever decides she would like blood transfusion she is welcome to return to the ER and we will coordinate admission for blood transfusion.  All questions answered.  Final Clinical Impressions(s) / ED Diagnoses   Final diagnoses:  Blood loss anemia  Dizziness  ESRD (end stage renal disease) on dialysis [facility])    ED Discharge Orders    None       Jola Schmidt, MD 07/20/17 [phone]

## 2017-07-20 NOTE — ED Triage Notes (Signed)
Pt from home with EMS c.o dizziness and central chest pain, sharp. Pt discharged from hospital admission last night around 10pm but states before she left she was starting to feel dizzy. Chest pain started 3 hours ago. 324 ASA and 1 nitro given en route pain went from 10/10 to 5/10. EKG sinus rhythm. Pt a.o, nad

## 2017-08-07 ENCOUNTER — Encounter (HOSPITAL_COMMUNITY): Payer: Self-pay

## 2017-08-07 ENCOUNTER — Emergency Department (HOSPITAL_COMMUNITY): Payer: Medicare Other

## 2017-08-07 ENCOUNTER — Other Ambulatory Visit: Payer: Self-pay

## 2017-08-07 ENCOUNTER — Inpatient Hospital Stay (HOSPITAL_COMMUNITY)
Admission: EM | Admit: 2017-08-07 | Discharge: 2017-08-11 | DRG: 640 | Disposition: A | Discharge status: Skilled Nursing Facility | Payer: Medicare Other | Attending: Family Medicine | Admitting: Family Medicine

## 2017-08-07 DIAGNOSIS — N185 Chronic kidney disease, stage 5: Secondary | ICD-10-CM | POA: Diagnosis not present

## 2017-08-07 DIAGNOSIS — F1420 Cocaine dependence, uncomplicated: Secondary | ICD-10-CM

## 2017-08-07 DIAGNOSIS — I10 Essential (primary) hypertension: Secondary | ICD-10-CM | POA: Diagnosis not present

## 2017-08-07 DIAGNOSIS — K219 Gastro-esophageal reflux disease without esophagitis: Secondary | ICD-10-CM | POA: Diagnosis present

## 2017-08-07 DIAGNOSIS — I5042 Chronic combined systolic (congestive) and diastolic (congestive) heart failure: Secondary | ICD-10-CM | POA: Diagnosis present

## 2017-08-07 DIAGNOSIS — Z9115 Patient's noncompliance with renal dialysis: Secondary | ICD-10-CM | POA: Diagnosis not present

## 2017-08-07 DIAGNOSIS — Z8673 Personal history of transient ischemic attack (TIA), and cerebral infarction without residual deficits: Secondary | ICD-10-CM | POA: Diagnosis not present

## 2017-08-07 DIAGNOSIS — E1122 Type 2 diabetes mellitus with diabetic chronic kidney disease: Secondary | ICD-10-CM | POA: Diagnosis present

## 2017-08-07 DIAGNOSIS — E8770 Fluid overload, unspecified: Principal | ICD-10-CM

## 2017-08-07 DIAGNOSIS — Z992 Dependence on renal dialysis: Secondary | ICD-10-CM

## 2017-08-07 DIAGNOSIS — E1140 Type 2 diabetes mellitus with diabetic neuropathy, unspecified: Secondary | ICD-10-CM | POA: Diagnosis present

## 2017-08-07 DIAGNOSIS — F17210 Nicotine dependence, cigarettes, uncomplicated: Secondary | ICD-10-CM | POA: Diagnosis present

## 2017-08-07 DIAGNOSIS — Z833 Family history of diabetes mellitus: Secondary | ICD-10-CM | POA: Diagnosis not present

## 2017-08-07 DIAGNOSIS — Z531 Procedure and treatment not carried out because of patient's decision for reasons of belief and group pressure: Secondary | ICD-10-CM | POA: Diagnosis present

## 2017-08-07 DIAGNOSIS — L97529 Non-pressure chronic ulcer of other part of left foot with unspecified severity: Secondary | ICD-10-CM | POA: Diagnosis present

## 2017-08-07 DIAGNOSIS — I509 Heart failure, unspecified: Secondary | ICD-10-CM | POA: Diagnosis not present

## 2017-08-07 DIAGNOSIS — G894 Chronic pain syndrome: Secondary | ICD-10-CM

## 2017-08-07 DIAGNOSIS — R531 Weakness: Secondary | ICD-10-CM | POA: Diagnosis present

## 2017-08-07 DIAGNOSIS — I132 Hypertensive heart and chronic kidney disease with heart failure and with stage 5 chronic kidney disease, or end stage renal disease: Secondary | ICD-10-CM | POA: Diagnosis present

## 2017-08-07 DIAGNOSIS — E11621 Type 2 diabetes mellitus with foot ulcer: Secondary | ICD-10-CM | POA: Diagnosis present

## 2017-08-07 DIAGNOSIS — J9601 Acute respiratory failure with hypoxia: Secondary | ICD-10-CM | POA: Diagnosis present

## 2017-08-07 DIAGNOSIS — Z794 Long term (current) use of insulin: Secondary | ICD-10-CM

## 2017-08-07 DIAGNOSIS — N2581 Secondary hyperparathyroidism of renal origin: Secondary | ICD-10-CM | POA: Diagnosis present

## 2017-08-07 DIAGNOSIS — E118 Type 2 diabetes mellitus with unspecified complications: Secondary | ICD-10-CM

## 2017-08-07 DIAGNOSIS — Z8249 Family history of ischemic heart disease and other diseases of the circulatory system: Secondary | ICD-10-CM | POA: Diagnosis not present

## 2017-08-07 DIAGNOSIS — E8889 Other specified metabolic disorders: Secondary | ICD-10-CM | POA: Diagnosis present

## 2017-08-07 DIAGNOSIS — N189 Chronic kidney disease, unspecified: Secondary | ICD-10-CM

## 2017-08-07 DIAGNOSIS — D631 Anemia in chronic kidney disease: Secondary | ICD-10-CM | POA: Diagnosis present

## 2017-08-07 DIAGNOSIS — H409 Unspecified glaucoma: Secondary | ICD-10-CM | POA: Diagnosis present

## 2017-08-07 DIAGNOSIS — N186 End stage renal disease: Secondary | ICD-10-CM

## 2017-08-07 DIAGNOSIS — F259 Schizoaffective disorder, unspecified: Secondary | ICD-10-CM | POA: Diagnosis present

## 2017-08-07 DIAGNOSIS — E7800 Pure hypercholesterolemia, unspecified: Secondary | ICD-10-CM | POA: Diagnosis present

## 2017-08-07 LAB — I-STAT TROPONIN, ED
Troponin i, poc: 0 ng/mL (ref 0.00–0.08)
Troponin i, poc: 0.01 ng/mL (ref 0.00–0.08)

## 2017-08-07 LAB — CBC
HEMATOCRIT: 22.9 % — AB (ref 36.0–46.0)
HEMOGLOBIN: 6.7 g/dL — AB (ref 12.0–15.0)
MCH: 29.4 pg (ref 26.0–34.0)
MCHC: 29.3 g/dL — ABNORMAL LOW (ref 30.0–36.0)
MCV: 100.4 fL — ABNORMAL HIGH (ref 78.0–100.0)
PLATELETS: 160 10*3/uL (ref 150–400)
RBC: 2.28 MIL/uL — AB (ref 3.87–5.11)
RDW: 17.4 % — AB (ref 11.5–15.5)
WBC: 1.6 10*3/uL — ABNORMAL LOW (ref 4.0–10.5)

## 2017-08-07 LAB — BASIC METABOLIC PANEL
ANION GAP: 12 (ref 5–15)
BUN: 38 mg/dL — ABNORMAL HIGH (ref 6–20)
CALCIUM: 8.9 mg/dL (ref 8.9–10.3)
CO2: 36 mmol/L — ABNORMAL HIGH (ref 22–32)
Chloride: 88 mmol/L — ABNORMAL LOW (ref 101–111)
Creatinine, Ser: 5.45 mg/dL — ABNORMAL HIGH (ref 0.44–1.00)
GFR, EST AFRICAN AMERICAN: 10 mL/min — AB (ref >60)
GFR, EST NON AFRICAN AMERICAN: 8 mL/min — AB (ref >60)
Glucose, Bld: 237 mg/dL — ABNORMAL HIGH (ref 65–99)
Potassium: 5.3 mmol/L — ABNORMAL HIGH (ref 3.5–5.1)
Sodium: 136 mmol/L (ref 135–145)

## 2017-08-07 LAB — BRAIN NATRIURETIC PEPTIDE: B Natriuretic Peptide: >4500 pg/mL — ABNORMAL HIGH (ref 0.0–100.0)

## 2017-08-07 LAB — CBG MONITORING, ED: Glucose-Capillary: 219 mg/dL — ABNORMAL HIGH (ref 65–99)

## 2017-08-07 MED ORDER — LIDOCAINE-PRILOCAINE 2.5-2.5 % EX CREA: 1.0000 "application " | TOPICAL_CREAM | CUTANEOUS | Status: DC | PRN

## 2017-08-07 MED ORDER — DOXERCALCIFEROL 4 MCG/2ML IV SOLN
2.0000 ug | INTRAVENOUS | Status: DC
Start: 2017-08-09 — End: 2017-08-11
  Administered 2017-08-09: 2 ug via INTRAVENOUS
  Filled 2017-08-07: qty 2

## 2017-08-07 MED ORDER — INSULIN ASPART 100 UNIT/ML ~~LOC~~ SOLN
7.0000 [IU] | Freq: Three times a day (TID) | SUBCUTANEOUS | Status: DC
Start: 2017-08-08 — End: 2017-08-11
  Administered 2017-08-08 – 2017-08-11 (×7): 7 [IU] via SUBCUTANEOUS

## 2017-08-07 MED ORDER — ENOXAPARIN SODIUM 30 MG/0.3ML ~~LOC~~ SOLN
30.0000 mg | Freq: Every day | SUBCUTANEOUS | Status: DC
  Administered 2017-08-08: 30 mg via SUBCUTANEOUS
  Filled 2017-08-07: qty 0.3

## 2017-08-07 MED ORDER — NEPRO/CARBSTEADY PO LIQD
237.0000 mL | Freq: Three times a day (TID) | ORAL | Status: DC
  Administered 2017-08-08 – 2017-08-11 (×8): 237 mL via ORAL
  Filled 2017-08-07 (×12): qty 237

## 2017-08-07 MED ORDER — SODIUM CHLORIDE 0.9% FLUSH
3.0000 mL | Freq: Two times a day (BID) | INTRAVENOUS | Status: DC
  Administered 2017-08-08 – 2017-08-11 (×5): 3 mL via INTRAVENOUS

## 2017-08-07 MED ORDER — CARVEDILOL 25 MG PO TABS
25.0000 mg | ORAL_TABLET | Freq: Two times a day (BID) | ORAL | Status: DC
  Administered 2017-08-08 – 2017-08-11 (×7): 25 mg via ORAL
  Filled 2017-08-07 (×4): qty 1
  Filled 2017-08-07: qty 2
  Filled 2017-08-07: qty 1
  Filled 2017-08-07: qty 2
  Filled 2017-08-07 (×2): qty 1

## 2017-08-07 MED ORDER — DIPHENHYDRAMINE HCL 25 MG PO CAPS: 25.0000 mg | ORAL_CAPSULE | Freq: Four times a day (QID) | ORAL | Status: DC | PRN

## 2017-08-07 MED ORDER — SODIUM CHLORIDE 0.9% FLUSH: 3.0000 mL | INTRAVENOUS | Status: DC | PRN

## 2017-08-07 MED ORDER — ACETAMINOPHEN 325 MG PO TABS: 650.0000 mg | ORAL_TABLET | ORAL | Status: DC | PRN

## 2017-08-07 MED ORDER — ATORVASTATIN CALCIUM 40 MG PO TABS
40.0000 mg | ORAL_TABLET | Freq: Every day | ORAL | Status: DC
  Administered 2017-08-08 – 2017-08-10 (×4): 40 mg via ORAL
  Filled 2017-08-07 (×4): qty 1

## 2017-08-07 MED ORDER — SODIUM CHLORIDE 0.9 % IV SOLN
125.0000 mg | INTRAVENOUS | Status: DC
  Administered 2017-08-09: 125 mg via INTRAVENOUS
  Filled 2017-08-07 (×2): qty 10

## 2017-08-07 MED ORDER — SODIUM CHLORIDE 0.9 % IV SOLN: 250.0000 mL | INTRAVENOUS | Status: DC | PRN

## 2017-08-07 MED ORDER — SEVELAMER CARBONATE 800 MG PO TABS
2400.0000 mg | ORAL_TABLET | Freq: Three times a day (TID) | ORAL | Status: DC
  Administered 2017-08-08 – 2017-08-11 (×9): 2400 mg via ORAL
  Filled 2017-08-07 (×10): qty 3

## 2017-08-07 MED ORDER — OXYCODONE HCL 5 MG PO TABS
10.0000 mg | ORAL_TABLET | Freq: Four times a day (QID) | ORAL | Status: DC | PRN
  Administered 2017-08-08 – 2017-08-11 (×5): 10 mg via ORAL
  Filled 2017-08-07 (×5): qty 2

## 2017-08-07 MED ORDER — ZOLPIDEM TARTRATE 5 MG PO TABS: 5.0000 mg | ORAL_TABLET | Freq: Every evening | ORAL | Status: DC | PRN

## 2017-08-07 MED ORDER — ALBUTEROL SULFATE (2.5 MG/3ML) 0.083% IN NEBU: 2.5000 mg | INHALATION_SOLUTION | RESPIRATORY_TRACT | Status: DC | PRN

## 2017-08-07 MED ORDER — INSULIN ASPART 100 UNIT/ML ~~LOC~~ SOLN
0.0000 [IU] | Freq: Three times a day (TID) | SUBCUTANEOUS | Status: DC
Start: 2017-08-08 — End: 2017-08-11
  Administered 2017-08-08: 5 [IU] via SUBCUTANEOUS
  Administered 2017-08-09 – 2017-08-10 (×2): 8 [IU] via SUBCUTANEOUS
  Administered 2017-08-10: 2 [IU] via SUBCUTANEOUS
  Administered 2017-08-11: 3 [IU] via SUBCUTANEOUS

## 2017-08-07 MED ORDER — LISINOPRIL 5 MG PO TABS
5.0000 mg | ORAL_TABLET | Freq: Every day | ORAL | Status: DC
  Administered 2017-08-08 – 2017-08-11 (×5): 5 mg via ORAL
  Filled 2017-08-07 (×4): qty 1
  Filled 2017-08-07: qty 2
  Filled 2017-08-07: qty 1

## 2017-08-07 MED ORDER — ALLOPURINOL 100 MG PO TABS
100.0000 mg | ORAL_TABLET | ORAL | Status: DC
  Administered 2017-08-09: 100 mg via ORAL
  Filled 2017-08-07: qty 1

## 2017-08-07 MED ORDER — CALCIUM ACETATE (PHOS BINDER) 667 MG PO CAPS
667.0000 mg | ORAL_CAPSULE | Freq: Three times a day (TID) | ORAL | Status: DC
  Administered 2017-08-08 (×3): 667 mg via ORAL
  Filled 2017-08-07 (×3): qty 1

## 2017-08-07 MED ORDER — TRAZODONE HCL 50 MG PO TABS
25.0000 mg | ORAL_TABLET | Freq: Every day | ORAL | Status: DC
  Administered 2017-08-08 – 2017-08-10 (×4): 25 mg via ORAL
  Filled 2017-08-07 (×4): qty 1

## 2017-08-07 MED ORDER — ONDANSETRON HCL 4 MG/2ML IJ SOLN: 4.0000 mg | Freq: Four times a day (QID) | INTRAMUSCULAR | Status: DC | PRN

## 2017-08-07 MED ORDER — MORPHINE SULFATE (PF) 4 MG/ML IV SOLN
2.0000 mg | Freq: Once | INTRAVENOUS | Status: AC
Start: 2017-08-07 — End: 2017-08-07
  Administered 2017-08-07: 2 mg via INTRAVENOUS
  Filled 2017-08-07: qty 1

## 2017-08-07 MED ORDER — HEPARIN SODIUM (PORCINE) 1000 UNIT/ML DIALYSIS: 1000.0000 [IU] | INTRAMUSCULAR | Status: DC | PRN

## 2017-08-07 MED ORDER — NEPRO/CARBSTEADY PO LIQD
237.0000 mL | Freq: Three times a day (TID) | ORAL | Status: DC | PRN
  Filled 2017-08-07: qty 237

## 2017-08-07 MED ORDER — LORAZEPAM 0.5 MG PO TABS
0.5000 mg | ORAL_TABLET | Freq: Three times a day (TID) | ORAL | Status: DC | PRN
  Filled 2017-08-07: qty 1

## 2017-08-07 MED ORDER — FERROUS SULFATE 325 (65 FE) MG PO TABS
325.0000 mg | ORAL_TABLET | Freq: Every day | ORAL | Status: DC
  Administered 2017-08-08 – 2017-08-11 (×4): 325 mg via ORAL
  Filled 2017-08-07 (×5): qty 1

## 2017-08-07 MED ORDER — RENA-VITE PO TABS
1.0000 | ORAL_TABLET | Freq: Every day | ORAL | Status: DC
  Administered 2017-08-08 – 2017-08-10 (×3): 1 via ORAL
  Filled 2017-08-07 (×4): qty 1

## 2017-08-07 MED ORDER — ALTEPLASE 2 MG IJ SOLR: 2.0000 mg | Freq: Once | INTRAMUSCULAR | Status: DC | PRN

## 2017-08-07 MED ORDER — INSULIN ASPART 100 UNIT/ML ~~LOC~~ SOLN
0.0000 [IU] | Freq: Every day | SUBCUTANEOUS | Status: DC
  Administered 2017-08-08: 3 [IU] via SUBCUTANEOUS

## 2017-08-07 MED ORDER — SERTRALINE HCL 50 MG PO TABS
50.0000 mg | ORAL_TABLET | Freq: Every day | ORAL | Status: DC
  Administered 2017-08-08 – 2017-08-11 (×4): 50 mg via ORAL
  Filled 2017-08-07 (×4): qty 1

## 2017-08-07 MED ORDER — SODIUM CHLORIDE 0.9 % IV SOLN: 100.0000 mL | INTRAVENOUS | Status: DC | PRN

## 2017-08-07 MED ORDER — CLONIDINE HCL 0.1 MG PO TABS
0.1000 mg | ORAL_TABLET | Freq: Three times a day (TID) | ORAL | Status: DC
  Administered 2017-08-08 – 2017-08-11 (×10): 0.1 mg via ORAL
  Filled 2017-08-07 (×10): qty 1

## 2017-08-07 MED ORDER — ASPIRIN EC 81 MG PO TBEC
81.0000 mg | DELAYED_RELEASE_TABLET | Freq: Every day | ORAL | Status: DC
  Administered 2017-08-08 – 2017-08-11 (×5): 81 mg via ORAL
  Filled 2017-08-07 (×5): qty 1

## 2017-08-07 MED ORDER — DOCUSATE SODIUM 283 MG RE ENEM
1.0000 | ENEMA | RECTAL | Status: DC | PRN
  Administered 2017-08-10 (×2): 283 mg via RECTAL
  Filled 2017-08-07 (×4): qty 1

## 2017-08-07 MED ORDER — SORBITOL 70 % SOLN
30.0000 mL | Status: DC | PRN
  Administered 2017-08-10 (×2): 30 mL via ORAL
  Filled 2017-08-07 (×2): qty 30

## 2017-08-07 MED ORDER — CALCIUM CARBONATE ANTACID 1250 MG/5ML PO SUSP
500.0000 mg | Freq: Four times a day (QID) | ORAL | Status: DC | PRN
  Filled 2017-08-07: qty 5

## 2017-08-07 MED ORDER — PANTOPRAZOLE SODIUM 40 MG PO TBEC
40.0000 mg | DELAYED_RELEASE_TABLET | Freq: Every day | ORAL | Status: DC
  Administered 2017-08-08 – 2017-08-11 (×4): 40 mg via ORAL
  Filled 2017-08-07 (×4): qty 1

## 2017-08-07 MED ORDER — LIDOCAINE HCL (PF) 1 % IJ SOLN: 5.0000 mL | INTRAMUSCULAR | Status: DC | PRN

## 2017-08-07 MED ORDER — PENTAFLUOROPROP-TETRAFLUOROETH EX AERO: 1.0000 "application " | INHALATION_SPRAY | CUTANEOUS | Status: DC | PRN

## 2017-08-07 MED ORDER — INSULIN GLARGINE 100 UNIT/ML ~~LOC~~ SOLN
20.0000 [IU] | Freq: Every day | SUBCUTANEOUS | Status: DC
  Administered 2017-08-08 – 2017-08-10 (×4): 20 [IU] via SUBCUTANEOUS
  Filled 2017-08-07 (×6): qty 0.2

## 2017-08-07 MED ORDER — IPRATROPIUM-ALBUTEROL 0.5-2.5 (3) MG/3ML IN SOLN
3.0000 mL | Freq: Once | RESPIRATORY_TRACT | Status: AC
  Administered 2017-08-07: 3 mL via RESPIRATORY_TRACT
  Filled 2017-08-07: qty 3

## 2017-08-07 NOTE — ED Notes (Signed)
Renal Diet Ordered for Dinner.

## 2017-08-07 NOTE — ED Notes (Signed)
o2 increased ti 4l/[place]

## 2017-08-07 NOTE — ED Notes (Signed)
This EMT attempted Urine specimen collection. Pt stated she does not produce urine. Rn notified.

## 2017-08-07 NOTE — ED Notes (Signed)
Attempted report to 35M.

## 2017-08-07 NOTE — ED Notes (Signed)
Pt returns from xray

## 2017-08-07 NOTE — ED Notes (Signed)
Pt Is off O2 by provider.

## 2017-08-07 NOTE — Discharge Planning (Signed)
HD patient, please notify [facility] [phone]) when pt ready for discharge.  EDCM contacted [place] to notify of pt ED encounter. Pt HD is scheduled for T,R, Sa  at [facility]. No further EDCM needs identified at this time.

## 2017-08-07 NOTE — ED Notes (Addendum)
o2 sat noted as low at 80% and pt placed back on o2. Provider notified.

## 2017-08-07 NOTE — Clinical Social Work Note (Signed)
Clinical Social Work Assessment  Patient Details  Name: [name] MRN: [id number] Date of Birth: Feb 21, 1966  Date of referral:  08/07/17               Reason for consult:  Facility Placement                Permission sought to share information with:    Permission granted to share information::  Yes, Verbal Permission Granted  Name::     Urbano Heir   Agency::     Relationship::  pt's son   Contact Information:     Housing/Transportation Living arrangements for the past 2 months:  Single Family Home(with son marquis. ) Source of Information:  Patient Patient Interpreter Needed:  None Criminal Activity/Legal Involvement Pertinent to Current Situation/Hospitalization:  No - Comment as needed Significant Relationships:  Adult Children Lives with:  Adult Children Do you feel safe going back to the place where you live?  Yes Need for family participation in patient care:  Yes (Comment)  Care giving concerns:  CSW spoke with pt and son at bedside. At this time pt is concerned with care now that pt's son has found a job and is unable to stay at home to care for pt as before.    Social Worker assessment / plan:  CSW spoke with pt and son at bedside. During this time CSW was informed that pt is from home with son. Pt reports to CSW that pt was suppose to go to [facility] in the past but never made it to the facility as pt had a family function that pt wanted to attend. At this time pt is requesting that pt be placed within another facility [facility] is top choice) for further rehab assistance. CSW explained to pt that if pt is placed again in a new facility pt may or may not be long term at the facility. At this time pts is agreeable to placement.   Employment status:  Other (Comment)(unknown. ) Insurance information:    PT Recommendations:  Not assessed at this time Information / Referral to community resources:  [place]  Patient/Family's Response to care:  Pt is  understanding and agreeable to plan of care at this time.   Patient/Family's Understanding of and Emotional Response to Diagnosis, Current Treatment, and Prognosis:  No further questions or concerns have been presented to CSW at this time.   Emotional Assessment Appearance:  Appears older than stated age Attitude/Demeanor/Rapport:    Affect (typically observed):    Orientation:  Oriented to Self, Oriented to Situation, Oriented to Place, Oriented to  Time Alcohol / Substance use:  Illicit Drugs, Alcohol Use Psych involvement (Current and /or in the community):  No (Comment)(not at this time.)  Discharge Needs  Concerns to be addressed:  Care Coordination Readmission within the last 30 days:  Yes Current discharge risk:  None Barriers to Discharge:  No Barriers Identified   Wetzel Bjornstad, [place] 08/07/2017, 12:09 PM

## 2017-08-07 NOTE — ED Notes (Signed)
Follow-up attempt to give report to 23M, unsuccessful.

## 2017-08-07 NOTE — ED Triage Notes (Addendum)
Pt arrives EMS from home with c/o pain and weakness to legs. Pt was due for dialysis today but was unable to walk to door. Pt states she wants to go to skilled nursing faciltiy. Pt states using cocaine yesterday as pain medicine.c/o shortness of breath and swelling at lower extremities.

## 2017-08-07 NOTE — H&P (Signed)
History and Physical    [name] STM:[id number] DOB: [birth year] DOA: 08/07/2017  PCP: [name], MD Consultants:  [facility] - nephrology Patient coming from:  Home - lives with son; Donald Prose: Pandora Leiter, [phone]  Chief Complaint: weakness  HPI: [name] is a 52 y.o. female with medical history significant of ESRD, acute on chronic heart failure presenting with her "Legs are starting not to work.  They used to go down at HD but now they are just like big fat legs that won't go down".  This AM, she woke up to get ready for HD and she couldn't stand up and eventually called 911.  She had to call 911 because she couldn't walk.  Her legs were too heavy.  +SOB, chronic, but it got worse an hour ago.  She has also had chest pain in the last hour.  She makes only a little bit of urine and only occasionally.  She previously had "internal bleeding", not noticing any blood in her stools recently.   ED Course: 1 month of heart failure exacerbation.  Very diaphoretic and SOB.  84% on RA.  CXR with pulmonary vascular congestion.  [facility] is aware that she will need HD today.  Review of Systems: As per HPI; otherwise review of systems reviewed and negative.   Ambulatory Status:  Ambulates with a walker  Past Medical History:  Diagnosis Date  . Anemia   . Anxiety   . Bipolar disorder, unspecified ([place]) 02/06/2014  . Chronic combined systolic and diastolic CHF (congestive heart failure) (HCC)    EF 40% by echo and 48% by stress test  . Chronic pain syndrome   . Depression   . ESRD on hemodialysis ([place])   . GERD (gastroesophageal reflux disease)   . Glaucoma   . Headache    sinus headaches  . Hepatitis C   . High cholesterol   . History of hiatal hernia   . History of vitamin D deficiency 06/20/2015  . Hypertension   . MRSA infection ?2014   "on the back of my head; spread throughout my bloodstream"  . Neuropathy   . Refusal of blood transfusions as patient is Jehovah's Witness   .  Schizoaffective disorder, unspecified condition 02/08/2014  . Seizure [facility]) dx'd 2014   "don't know what kind; last one was ~ 04/2014"  . Stroke [facility])    TIA 2009  . TIA (transient ischemic attack) 2010  . Type II diabetes mellitus ([place])    INSULIN DEPENDENT    Past Surgical History:  Procedure Laterality Date  . ABDOMINAL HYSTERECTOMY  2014  . ABDOMINAL HYSTERECTOMY  2010  . AMPUTATION Left 05/21/2013   Procedure: Left Midfoot AMPUTATION;  Surgeon: Newt Minion, MD;  Location: [facility];  Service: Orthopedics;  Laterality: Left;  Left Midfoot Amputation  . AV FISTULA PLACEMENT Left 05/04/2015   Procedure: ARTERIOVENOUS (AV) GRAFT INSERTION;  Surgeon: Angelia Mould, MD;  Location: [place];  Service: Vascular;  Laterality: Left;  . COLONOSCOPY WITH PROPOFOL N/A 06/22/2013   Procedure: COLONOSCOPY WITH PROPOFOL;  Surgeon: Jeryl Columbia, MD;  Location: [facility] ENDOSCOPY;  Service: Endoscopy;  Laterality: N/A;  . ESOPHAGOGASTRODUODENOSCOPY N/A 05/11/2015   Procedure: ESOPHAGOGASTRODUODENOSCOPY (EGD);  Surgeon: Laurence Spates, MD;  Location: [facility] ENDOSCOPY;  Service: Endoscopy;  Laterality: N/A;  . EYE SURGERY Bilateral 2010   Lasik  . FLEXIBLE SIGMOIDOSCOPY N/A 01/17/2017   Procedure: FLEXIBLE SIGMOIDOSCOPY w/ hemorrhoid banding possible;  Surgeon: Gatha Mayer, MD;  Location: [facility] ENDOSCOPY;  Service: Endoscopy;  Laterality: N/A;  . FOOT AMPUTATION Left    due diabetic neuropathy, could not feel ulcer on bottom of foot  . REFRACTIVE SURGERY Bilateral 2013  . TONSILLECTOMY  1970's  . TUBAL LIGATION  2000    Social History   Socioeconomic History  . Marital status: Single    Spouse name: Not on file  . Number of children: 3  . Years of education: 47  . Highest education level: 12th grade  Social Needs  . Financial resource strain: Not on file  . Food insecurity - worry: Not on file  . Food insecurity - inability: Not on file  . Transportation needs - medical: Not on file  .  Transportation needs - non-medical: Not on file  Occupational History  . Occupation: disabled  Tobacco Use  . Smoking status: Current Some Day Smoker    Packs/day: 0.10    Years: 30.00    Pack years: 3.00    Types: Cigarettes    Last attempt to quit: 03/31/2015    Years since quitting: 2.3  . Smokeless tobacco: Never Used  Substance and Sexual Activity  . Alcohol use: Yes    Alcohol/week: 1.8 oz    Types: 3 Standard drinks or equivalent per week    Comment: LAST USE YESTERDAY  . Drug use: Yes    Types: "Crack" cocaine, Cocaine    Comment: LAST USE YESTERDAY  . Sexual activity: Not Currently    Birth control/protection: Abstinence  Other Topics Concern  . Not on file  Social History Narrative   ** Merged History Encounter **   Single, lives with son in a one story home. Unable to exercise. Avoids caffeine. 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        Allergies  Allergen Reactions  . Penicillins Hives and Other (See Comments)    Has patient had a PCN reaction causing immediate rash, facial/tongue/throat swelling, SOB or lightheadedness with hypotension: Yes Has patient had a PCN reaction causing severe rash involving mucus membranes or skin necrosis: No Has patient had a PCN reaction that required hospitalization No Has patient had a PCN reaction occurring within the last 10 years: No If all of the above answers are "NO", then may proceed with Cephalosporin use. Pt tolerates cefepime and ceftriaxone  . Gabapentin Itching and Swelling  . Lyrica [Pregabalin] Swelling and Other (See Comments)    Facial and leg swelling  . Saphris [Asenapine] Swelling and Other (See Comments)    Facial swelling  . Tramadol Swelling and Other (See Comments)    Leg swelling  . Other Other (See Comments)    NO BLOOD PRODUCTS. 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 Hypertension Mother   . Diabetes Mother   .  Hypertension Father   . Diabetes Father     Prior to Admission medications   Medication Sig Start Date End Date Taking? Authorizing Provider  albuterol (PROVENTIL HFA;VENTOLIN HFA) 108 (90 Base) MCG/ACT inhaler Inhale 2 puffs into the lungs every 4 (four) hours as needed for wheezing or shortness of breath. 10/15/16   Merryl Hacker, MD  allopurinol (ZYLOPRIM) 100 MG tablet Take 1 tablet (100 mg total) by mouth every Tuesday, Thursday, and Saturday at 6 PM. 01/25/17   Nita Sells, MD  amLODipine (NORVASC) 10 MG tablet Take 1 tablet (10 mg total) by mouth at bedtime. 09/02/16   Hildred Priest,  MD  atorvastatin (LIPITOR) 40 MG tablet Take 1 tablet (40 mg total) by mouth at bedtime. 09/02/16   Hildred Priest, MD  blood glucose meter kit and supplies KIT Dispense based on patient and insurance preference. Use up to four times daily as directed. (FOR ICD-9 250.00, 250.01). 06/07/17   Ina Homes, MD  calcium acetate (PHOSLO) 667 MG capsule Take 2 capsules (1,334 mg total) by mouth 3 (three) times daily with meals. Patient taking differently: Take 667 mg by mouth 3 (three) times daily with meals.  09/02/16   Hildred Priest, MD  carvedilol (COREG) 25 MG tablet Take 1 tablet (25 mg total) by mouth 2 (two) times daily with a meal. 09/02/16   Hildred Priest, MD  cloNIDine (CATAPRES) 0.1 MG tablet Take 1 tablet (0.1 mg total) by mouth every 8 (eight) hours. 09/02/16   Hildred Priest, MD  diphenhydrAMINE (BENADRYL) 25 mg capsule Take 25 mg by mouth every 6 (six) hours as needed for itching. Patient is given one capsule on dialysis days    [provider]  Ferrous Sulfate (SLOW FE) 142 (45 Fe) MG TBCR Take 1 tablet by mouth daily. 07/19/17   Mariel Aloe, MD  hydrocortisone (ANUSOL-HC) 2.5 % rectal cream Place rectally 2 (two) times daily. Patient taking differently: Place 1 application rectally daily as needed.  01/24/17   Nita Sells, MD  insulin aspart (NOVOLOG) 100 UNIT/ML injection Inject 7 Units into the skin 3 (three) times daily with meals. 06/07/17   Ina Homes, MD  insulin glargine (LANTUS) 100 UNIT/ML injection Inject 0.2 mLs (20 Units total) into the skin at bedtime. 06/07/17   Helberg, Larkin Ina, MD  LORazepam (ATIVAN) 0.5 MG tablet Take 1 tablet (0.5 mg total) by mouth every 8 (eight) hours as needed for anxiety. 07/19/17   Mariel Aloe, MD  multivitamin (RENA-VIT) TABS tablet Take 1 tablet by mouth at bedtime. 09/02/16   Hildred Priest, MD  mupirocin ointment (BACTROBAN) 2 % Apply 1 application topically daily. 03/06/17   [provider]  Nutritional Supplements (FEEDING SUPPLEMENT, NEPRO CARB STEADY,) LIQD Take 237 mLs by mouth 3 (three) times daily between meals. 02/28/17   Mikhail, Velta Addison, DO  Oxycodone HCl 10 MG TABS Take 1 tablet (10 mg total) by mouth every 6 (six) hours as needed. 07/19/17   Mariel Aloe, MD  pantoprazole (PROTONIX) 40 MG tablet Take 1 tablet (40 mg total) by mouth daily at 6 (six) AM. 07/20/17   Mariel Aloe, MD  sertraline (ZOLOFT) 50 MG tablet Take 1 tablet (50 mg total) by mouth daily. 07/19/17   Mariel Aloe, MD  traZODone (DESYREL) 50 MG tablet Take 0.5 tablets (25 mg total) by mouth at bedtime. 07/19/17   Mariel Aloe, MD  vitamin C (ASCORBIC ACID) 500 MG tablet Take 500 mg by mouth daily.    [provider]    Physical Exam: Vitals:   08/07/17 1600 08/07/17 1635 08/07/17 1730 08/07/17 1900  BP: (!) 177/75 (!) 162/79 107/82 (!) 168/83  Pulse: 88 86 87 85  Resp: '11 17 16 19  '$ Temp:      TempSrc:      SpO2: (!) 84% (!) 86%  99%  Weight:      Height:         General:  Appears calm and comfortable and is NAD; mild persistent hypoxia on [place] O2 Eyes:  PERRL, EOMI, normal lids, iris ENT:  grossly normal hearing, lips & tongue, mmm Neck:  no  LAD, masses or thyromegaly; no carotid bruits Cardiovascular:  RRR, no m/r/g. 3+ LE  edema.  Respiratory:   Mild diffuse rhonchi.  Mildly increased respiratory effort. Abdomen:  soft, NT, ND, NABS Skin:  no rash or induration seen on limited exam Musculoskeletal: s/p L foot amputation, 3+ pitting edema Psychiatric:  grossly normal mood and affect, speech fluent and appropriate, AOx3 Neurologic:  CN 2-12 grossly intact, moves all extremities in coordinated fashion, sensation intact    Radiological Exams on Admission: Dg Chest 2 View  Result Date: 08/07/2017 CLINICAL DATA:  Shortness of breath, cough for 5 days EXAM: CHEST  2 VIEW COMPARISON:  07/16/2017 FINDINGS: Cardiomegaly with vascular congestion. Interstitial prominence likely reflects mild interstitial edema. No significant effusions. No acute bony abnormality. IMPRESSION: Cardiomegaly with vascular congestion and suspected mild interstitial edema. Electronically Signed   By: Rolm Baptise M.D.   On: 08/07/2017 11:01    EKG: Independently reviewed.  NSR with rate 81; prolonged QTc 514; nonspecific ST changes with no evidence of acute ischemia   Labs on Admission: I have personally reviewed the available labs and imaging studies at the time of the admission.  Pertinent labs:   Glucose 219 BNP >4500 Troponin negative x 2 K+ 5.3 CO2 36 BUN 38/Creatinine 5.45/GFR 10 WBC 1.6 Hgb 6.7   Assessment/Plan Principal Problem:   Volume overload Active Problems:   Chronic combined systolic (congestive) and diastolic (congestive) heart failure (HCC)   Essential hypertension   Anemia of chronic kidney failure   Chronic pain syndrome   Cocaine use disorder, severe, dependence (HCC)   End stage renal disease (HCC)   Diabetes mellitus with complication (HCC)   Volume overload in the setting of chronic CHF and ESRD -Patient due for HD today presenting with weakness, developed worsening SOB and hypoxia while in the ER -Has significant LE edema -CXR consistent with pulmonary edema -Markedly elevated BNP -With elevated  BNP and abnl CXR, clearly she is volume overloaded - likely in CHF but diuresis is ineffective given her ESRD -Will admit with telemetry -Will request repeat echocardiogram -Will start Lisinopril 5 mg daily (with ESRD, HTN generally responds well to this medication and it may also be beneficial for her CHF) -Continue Coreg -CHF order set utilized; may need CHF team consult but will hold until Echo results are available -Continue [place] O2 for now -Repeat EKG in AM -She may require multiple dialysis sessions to improve her volume overload  ESRD -Patient on chronic TTS HD -Nephrology prn order set utilized -Nephrology has consulted and will dialyze as soon as available  Chronic pain with cocaine use -The patient reports that she uses cocaine/"street drugs" because she is unable to receive consistent prescriptions to control her chronic pain -While I do not do outpatient medication, I attempted to explain the rationale behind not giving controlled substances to patients also using illicit substances -I suggested that she may be able to have an agreement with her PCP or pain management for a 57-monthsupply of pain medication with the understanding that she may be called at any time and subjected to a drug screen and that she would have to submit to drug screens as requested immediately; any positive tests would then result in disqualification from any further opiates.  -She voiced understanding but also understands that this was only a suggestion and that I am unable to make such an agreement with her. -For now will continue oxycodone prn without escalation.  DM -Will check A1c -Continue basal-bolus insulin -Cover  with moderate-scale SSI  Anemia -Patient is Jehovah's witness and will not accept blood products even at the risk of death -Hgb is currently stable    DVT prophylaxis:  Lovenox  Code Status:  Full - confirmed with patient/family Family Communication: Son present throughout  encounter Disposition Plan:  Requests to go to [facility] SNF once clinically improved; will request SW consult Consults called: Nephrology; SW; CM Admission status: Admit - It is my clinical opinion that admission to INPATIENT is reasonable and necessary because this patient will require at least 2 midnights in the hospital to treat this condition based on the medical complexity of the problems presented.  Given the aforementioned information, the predictability of an adverse outcome is felt to be significant.    Karmen Bongo MD [facility]  If note is complete, please contact covering daytime or nighttime physician. [email or web address] Password [email or web address]  08/07/2017, 7:30 PM

## 2017-08-07 NOTE — ED Notes (Signed)
Pt c/o chest pain . Eating hamburger brought in by son.

## 2017-08-07 NOTE — ED Notes (Signed)
Provider aware of c/o chest pain

## 2017-08-07 NOTE — ED Notes (Signed)
IV attempted x 1 wihtout success.

## 2017-08-07 NOTE — ED Provider Notes (Signed)
[facility] EMERGENCY DEPARTMENT Provider Note   CSN: [id number] Arrival date & time: 08/07/17  0941     History   Chief Complaint Chief Complaint  Patient presents with  . Leg Pain  . Weakness    HPI [name] is a 52 y.o. female with history of ESRD on dialysis Tuesday, Thursday, Saturday, GERD, chronic pain syndrome, hypertension, chronic combined systolic and diastolic CHF, neuropathy, insulin-dependent DM type II, TIA presents today with chief complaint gradual onset, progressively worsening generalized weakness.  She states that earlier today she was on her way to go to dialysis when she states "I got really short of breath and I could not get off the couch.  My legs have been giving out more and more over the past several months.  Yesterday my legs gave out while I was cooking and I could get my belly off the stove and I got this burn on my stomach ".  She notes orthopnea, PND, and bilateral lower extremity swelling which has been worsening.  She notes diaphoresis and dyspnea on exertion, per EMS report she could not ambulate to the ambulance and with assistance she became extremely short of breath and diaphoretic.  She also notes lightheadedness and dizziness on ambulation and states that she was diagnosed with vertigo in the emergency department but was not given any medications for it.  She states that she is typically ambulatory with a walker but states that she has been feeling weaker over the past several months.  She states "I need to go to a skilled nursing facility.  I do not want to die in my home alone ".  She also notes aching pain to her bilateral lower extremities which has been ongoing.  She notes that yesterday she took cocaine "to help manage my pain which is all over my body ".  She states that 30 minutes prior to my assessment she began developing left-sided constant aching chest pain.  She denies fevers, chills, or cough.  She last had dialysis on  Tuesday and received the full course.  The history is provided by the patient.  Weakness  Primary symptoms include dizziness. Associated symptoms include shortness of breath and chest pain. Pertinent negatives include no vomiting.    Past Medical History:  Diagnosis Date  . Anemia   . Anxiety   . Bipolar disorder, unspecified ([place]) 02/06/2014  . Chronic combined systolic and diastolic CHF (congestive heart failure) (HCC)    EF 40% by echo and 48% by stress test  . Chronic pain syndrome   . Depression   . ESRD on hemodialysis ([place])   . GERD (gastroesophageal reflux disease)   . Glaucoma   . Headache    sinus headaches  . Hepatitis C   . High cholesterol   . History of hiatal hernia   . History of vitamin D deficiency 06/20/2015  . Hypertension   . MRSA infection ?2014   "on the back of my head; spread throughout my bloodstream"  . Neuropathy   . Refusal of blood transfusions as patient is Jehovah's Witness   . Schizoaffective disorder, unspecified condition 02/08/2014  . Seizure [facility]) dx'd 2014   "don't know what kind; last one was ~ 04/2014"  . Stroke [facility])    TIA 2009  . TIA (transient ischemic attack) 2010  . Type II diabetes mellitus ([place])    INSULIN DEPENDENT    Patient Active Problem List   Diagnosis Date Noted  . Lower  GI bleed   . Evaluation by psychiatric service required 07/17/2017  . Volume overload 06/03/2017  . Hyperglycemia 06/03/2017  . Right supracondylar humerus fracture, with nonunion, subsequent encounter 05/01/2017  . Malnutrition of moderate degree 02/28/2017  . Leg pain, left 02/25/2017  . Left leg pain 02/25/2017  . External hemorrhoids   . Diverticulosis of colon without hemorrhage   . Goals of care, counseling/discussion   . Palliative care encounter   . Cellulitis of left leg 01/16/2017  . Diabetes mellitus with complication ([place]) 00/37/0488  . Upper GI bleed 01/16/2017  . UGIB (upper gastrointestinal bleed)   . Anemia due to chronic  kidney disease   . Cellulitis of left thigh 01/01/2017  . End stage renal disease ([place]) 01/01/2017  . Cocaine use disorder, severe, dependence ([place]) 08/29/2016  . Hepatitis C 08/29/2016  . Diabetic ulcer of calf ([place]) 08/29/2016  . Diabetic ulcer of toe ([place]) 08/29/2016  . Cerebrovascular accident (CVA) ([place]) 08/29/2016  . MDD (major depressive disorder) 08/28/2016  . Dyslipidemia associated with type 2 diabetes mellitus ([place]) 11/10/2015  . Hyperkalemia 09/17/2015  . History of vitamin D deficiency 06/20/2015  . Long term prescription opiate use 06/20/2015  . Chronic pain syndrome 06/19/2015  . Anemia of chronic kidney failure 05/01/2015  . transmetatarsal amputation of the left foot 12/13/2014  . Unilateral visual loss 11/22/2014  . 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 ([place]) 11/22/2014  . Essential hypertension 11/09/2014  . Dizziness   . Chronic combined systolic (congestive) and diastolic (congestive) heart failure ([place])   . Rectal bleeding 04/23/2014  . Anemia, iron deficiency 04/13/2014  . Noncompliance 02/02/2014  . Hyponatremia 05/19/2013  . CKD (chronic kidney disease) stage 5, GFR less than 15 ml/min (HCC) 05/18/2013  . Seizure disorder ([place]) 02/09/2013    Past Surgical History:  Procedure Laterality Date  . ABDOMINAL HYSTERECTOMY  2014  . ABDOMINAL HYSTERECTOMY  2010  . AMPUTATION Left 05/21/2013   Procedure: Left Midfoot AMPUTATION;  Surgeon: Newt Minion, MD;  Location: [facility];  Service: Orthopedics;  Laterality: Left;  Left Midfoot Amputation  . AV FISTULA PLACEMENT Left 05/04/2015   Procedure: ARTERIOVENOUS (AV) GRAFT INSERTION;  Surgeon: Angelia Mould, MD;  Location: [facility];  Service: Vascular;  Laterality: Left;  . COLONOSCOPY WITH PROPOFOL N/A 06/22/2013   Procedure: COLONOSCOPY WITH PROPOFOL;  Surgeon: Jeryl Columbia, MD;  Location: [facility] ENDOSCOPY;  Service: Endoscopy;  Laterality: N/A;  . ESOPHAGOGASTRODUODENOSCOPY N/A 05/11/2015   Procedure:  ESOPHAGOGASTRODUODENOSCOPY (EGD);  Surgeon: Laurence Spates, MD;  Location: [facility] ENDOSCOPY;  Service: Endoscopy;  Laterality: N/A;  . EYE SURGERY Bilateral 2010   Lasik  . FLEXIBLE SIGMOIDOSCOPY N/A 01/17/2017   Procedure: FLEXIBLE SIGMOIDOSCOPY w/ hemorrhoid banding possible;  Surgeon: Gatha Mayer, MD;  Location: [facility] ENDOSCOPY;  Service: Endoscopy;  Laterality: N/A;  . FOOT AMPUTATION Left    due diabetic neuropathy, could not feel ulcer on bottom of foot  . REFRACTIVE SURGERY Bilateral 2013  . TONSILLECTOMY  1970's  . TUBAL LIGATION  2000    OB History    Gravida Para Term Preterm AB Living   0 0 0 0 0     SAB TAB Ectopic Multiple Live Births   0 0 0           Home Medications    Prior to Admission medications   Medication Sig Start Date End Date Taking? Authorizing Provider  albuterol (PROVENTIL HFA;VENTOLIN HFA) 108 (90 Base) MCG/ACT inhaler Inhale 2 puffs into  the lungs every 4 (four) hours as needed for wheezing or shortness of breath. 10/15/16   Merryl Hacker, MD  allopurinol (ZYLOPRIM) 100 MG tablet Take 1 tablet (100 mg total) by mouth every Tuesday, Thursday, and Saturday at 6 PM. 01/25/17   Nita Sells, MD  amLODipine (NORVASC) 10 MG tablet Take 1 tablet (10 mg total) by mouth at bedtime. 09/02/16   Hildred Priest, MD  atorvastatin (LIPITOR) 40 MG tablet Take 1 tablet (40 mg total) by mouth at bedtime. 09/02/16   Hildred Priest, MD  blood glucose meter kit and supplies KIT Dispense based on patient and insurance preference. Use up to four times daily as directed. (FOR ICD-9 250.00, 250.01). 06/07/17   Ina Homes, MD  calcium acetate (PHOSLO) 667 MG capsule Take 2 capsules (1,334 mg total) by mouth 3 (three) times daily with meals. Patient taking differently: Take 667 mg by mouth 3 (three) times daily with meals.  09/02/16   Hildred Priest, MD  carvedilol (COREG) 25 MG tablet Take 1 tablet (25 mg total) by mouth 2 (two) times  daily with a meal. 09/02/16   Hildred Priest, MD  cloNIDine (CATAPRES) 0.1 MG tablet Take 1 tablet (0.1 mg total) by mouth every 8 (eight) hours. 09/02/16   Hildred Priest, MD  diphenhydrAMINE (BENADRYL) 25 mg capsule Take 25 mg by mouth every 6 (six) hours as needed for itching. Patient is given one capsule on dialysis days    [provider]  Ferrous Sulfate (SLOW FE) 142 (45 Fe) MG TBCR Take 1 tablet by mouth daily. 07/19/17   Mariel Aloe, MD  hydrocortisone (ANUSOL-HC) 2.5 % rectal cream Place rectally 2 (two) times daily. Patient taking differently: Place 1 application rectally daily as needed.  01/24/17   Nita Sells, MD  insulin aspart (NOVOLOG) 100 UNIT/ML injection Inject 7 Units into the skin 3 (three) times daily with meals. 06/07/17   Ina Homes, MD  insulin glargine (LANTUS) 100 UNIT/ML injection Inject 0.2 mLs (20 Units total) into the skin at bedtime. 06/07/17   Helberg, Larkin Ina, MD  LORazepam (ATIVAN) 0.5 MG tablet Take 1 tablet (0.5 mg total) by mouth every 8 (eight) hours as needed for anxiety. 07/19/17   Mariel Aloe, MD  multivitamin (RENA-VIT) TABS tablet Take 1 tablet by mouth at bedtime. 09/02/16   Hildred Priest, MD  mupirocin ointment (BACTROBAN) 2 % Apply 1 application topically daily. 03/06/17   [provider]  Nutritional Supplements (FEEDING SUPPLEMENT, NEPRO CARB STEADY,) LIQD Take 237 mLs by mouth 3 (three) times daily between meals. 02/28/17   Mikhail, Velta Addison, DO  Oxycodone HCl 10 MG TABS Take 1 tablet (10 mg total) by mouth every 6 (six) hours as needed. 07/19/17   Mariel Aloe, MD  pantoprazole (PROTONIX) 40 MG tablet Take 1 tablet (40 mg total) by mouth daily at 6 (six) AM. 07/20/17   Mariel Aloe, MD  sertraline (ZOLOFT) 50 MG tablet Take 1 tablet (50 mg total) by mouth daily. 07/19/17   Mariel Aloe, MD  traZODone (DESYREL) 50 MG tablet Take 0.5 tablets (25 mg total) by mouth at bedtime.  07/19/17   Mariel Aloe, MD  vitamin C (ASCORBIC ACID) 500 MG tablet Take 500 mg by mouth daily.    [provider]    Family History Family History  Problem Relation Age of Onset  . Hypertension Mother   . Diabetes Mother   . Hypertension Father   . Diabetes Father  Social History Social History   Tobacco Use  . Smoking status: Current Some Day Smoker    Packs/day: 0.10    Years: 30.00    Pack years: 3.00    Types: Cigarettes    Last attempt to quit: 03/31/2015    Years since quitting: 2.3  . Smokeless tobacco: Never Used  Substance Use Topics  . Alcohol use: Yes    Alcohol/week: 1.8 oz    Types: 3 Standard drinks or equivalent per week    Comment: LAST USE YESTERDAY  . Drug use: Yes    Types: "Crack" cocaine, Cocaine    Comment: LAST USE YESTERDAY     Allergies   Penicillins; Gabapentin; Lyrica [pregabalin]; Saphris [asenapine]; Tramadol; and Other   Review of Systems Review of Systems  Constitutional: Negative for chills and fever.  Respiratory: Positive for shortness of breath.   Cardiovascular: Positive for chest pain. Negative for palpitations.  Gastrointestinal: Positive for nausea. Negative for abdominal pain and vomiting.  Musculoskeletal: Positive for arthralgias and myalgias.  Skin: Positive for wound.  Neurological: Positive for dizziness, weakness (Generalized) and light-headedness. Negative for syncope.     Physical Exam Updated Vital Signs BP (!) 161/70   Pulse 82   Temp 97.7 F (36.5 C) (Oral)   Resp (!) 21   Ht 5' 4" (1.626 m)   Wt 68.5 kg (151 lb)   LMP  (LMP Unknown)   SpO2 94%   BMI 25.92 kg/m   Physical Exam  Constitutional: She is oriented to person, place, and time. She appears well-developed and well-nourished. No distress.  Resting comfortably in bed  HENT:  Head: Normocephalic and atraumatic.  Eyes: Conjunctivae and EOM are normal. Pupils are equal, round, and reactive to light. Right eye exhibits no  discharge. Left eye exhibits no discharge.  Neck: Normal range of motion. Neck supple. No JVD present. No tracheal deviation present.  Cardiovascular: Normal rate, regular rhythm and intact distal pulses.  Murmur heard. 1+ DP/PT pulses bilaterally, Homan sign absent bilaterally.  2+ pitting edema noted in the bilateral lower extremities.  Dialysis fistula in the left upper extremity with palpable thrill  Pulmonary/Chest: Effort normal. She has rales.  Scattered crackles and rails, worst in the bilateral lung bases posteriorly.  Equal rise and fall of chest, speaking in full sentences without difficulty on 3 L/min [place].   Abdominal: Soft. Bowel sounds are normal. She exhibits no distension. There is no tenderness.  Superficial skin burn superior to the umbilicus.   Musculoskeletal: She exhibits tenderness. She exhibits no edema.  Surgical amputation of the toes of the left foot.  4+/5 strength of the bilateral hip flexors, 5/5 strength of remainder of bilateral lower extremity major muscle groups.  5/5 strength of bilateral upper extremity major muscle groups.  Diffuse mild tenderness to palpation of the bilateral lower extremities with no focal tenderness  Neurological: She is alert and oriented to person, place, and time.  Skin: Skin is warm and dry. No erythema.  3cm chronic appearing annular wound to the heel of the left foot.  There is healthy-appearing granulation tissue, no surrounding induration or fluctuance.  No abnormal drainage.  Psychiatric: She has a normal mood and affect. Her behavior is normal.  Nursing note and vitals reviewed.    ED Treatments / Results  Labs (all labs ordered are listed, but only abnormal results are displayed) Labs Reviewed  BASIC METABOLIC PANEL - Abnormal; Notable for the following components:      Result Value  Potassium 5.3 (*)    Chloride 88 (*)    CO2 36 (*)    Glucose, Bld 237 (*)    BUN 38 (*)    Creatinine, Ser 5.45 (*)    GFR calc non Af  Amer 8 (*)    GFR calc Af Amer 10 (*)    All other components within normal limits  CBC - Abnormal; Notable for the following components:   WBC 1.6 (*)    RBC 2.28 (*)    Hemoglobin 6.7 (*)    HCT 22.9 (*)    MCV 100.4 (*)    MCHC 29.3 (*)    RDW 17.4 (*)    All other components within normal limits  BRAIN NATRIURETIC PEPTIDE - Abnormal; Notable for the following components:   B Natriuretic Peptide >4,500.0 (*)    All other components within normal limits  CBG MONITORING, ED - Abnormal; Notable for the following components:   Glucose-Capillary 219 (*)    All other components within normal limits  URINALYSIS, ROUTINE W REFLEX MICROSCOPIC  I-STAT TROPONIN, ED  I-STAT TROPONIN, ED    EKG  EKG Interpretation  Date/Time:  Thursday August 07 2017 09:50:06 EST Ventricular Rate:  81 PR Interval:    QRS Duration: 92 QT Interval:  442 QTC Calculation: 514 R Axis:   81 Text Interpretation:  Sinus rhythm Probable left atrial enlargement Borderline T abnormalities, anterior leads Prolonged QT interval Confirmed by Nat Christen [phone]) on 08/07/2017 10:08:05 AM Also confirmed by Nat Christen [phone])  on 08/07/2017 10:54:22 AM Also confirmed by Nat Christen [phone])  on 08/07/2017 12:18:00 PM       Radiology Dg Chest 2 View  Result Date: 08/07/2017 CLINICAL DATA:  Shortness of breath, cough for 5 days EXAM: CHEST  2 VIEW COMPARISON:  07/16/2017 FINDINGS: Cardiomegaly with vascular congestion. Interstitial prominence likely reflects mild interstitial edema. No significant effusions. No acute bony abnormality. IMPRESSION: Cardiomegaly with vascular congestion and suspected mild interstitial edema. Electronically Signed   By: Rolm Baptise M.D.   On: 08/07/2017 11:01    Procedures Procedures (including critical care time)  Medications Ordered in ED Medications  morphine 4 MG/ML injection 2 mg (not administered)  ipratropium-albuterol (DUONEB) 0.5-2.5 (3) MG/3ML nebulizer solution 3 mL (3 mLs  Nebulization Given 08/07/17 1140)     Initial Impression / Assessment and Plan / ED Course  I have reviewed the triage vital signs and the nursing notes.  Pertinent labs & imaging results that were available during my care of the patient were reviewed by me and considered in my medical decision making (see chart for details).     Patient presents with shortness of breath, dyspnea on exertion, and ongoing worsening bilateral lower extremity swelling for 1 month.  Today, she was unable to attend dialysis due to generalized weakness and dyspnea on exertion.  Afebrile in the ED.  Her oxygen saturation dropped to 84% on room air, with improvement on 4 L via nasal cannula.  Further improvement after administration of a DuoNeb.  Chest x-ray shows pulmonary vascular congestion and interstitial edema as well as cardiomegaly.  Troponin is negative.  EKG shows prolonged QT and nonspecific T wave changes consistent with her hyperkalemia with a potassium of 5.3. Spoke with Dr. Florene Glen with nephrology who is aware that she will require dialysis while in the hospital. Spoke with Dr. Lorin Mercy with [facility] hospitalist service who agrees to assume care of patient and bring her into the hospital for further evaluation and management.  Patient seen and evaluated by Dr. Lacinda Axon who agrees with assessment and plan at this time.  Final Clinical Impressions(s) / ED Diagnoses   Final diagnoses:  Acute on chronic congestive heart failure, unspecified heart failure type [facility])    ED Discharge Orders    None       Renita Papa, PA-C 08/07/17 1441    Nat Christen, MD 08/09/17 [phone]

## 2017-08-07 NOTE — Consult Note (Signed)
[facility] Renal Consultation Note    Indication for Consultation:  Management of ESRD/hemodialysis, anemia, hypertension/volume, and secondary hyperparathyroidism. PCP:  HPI: [name] is a 52 y.o. female with ESRD, HTN, Bipolar disorder, HFrEF (45-50%), GERD, Hep C, Type 2 DM, Hx CVA, and Jehovah's Witness who is being admitted with dyspnea/volume overload.  [name] reports ongoing shortness of breath for months. Has missed several dialysis sessions over the past few weeks. Tells me that she spent $200 on herbs promised to "cure" her kidney failure and skipping HD x 4 times, ended up getting overloaded. Additionally, admits to using cocaine and has been eating pellet ice in large quantities. Woke up this morning to get ready for dialysis, but legs were too swollen and weak and she was too short of breath to get out of her chair. Called 911 for transport to ED. Here, found to be hypertensive and hypoxic. Labs showed Na 136, K 5.3, CO2 36, BNP >4500, trop 0.02, WBC 1.6, Hgb 6.7 (improved from 5.9 last check at outpt HD center). CXR consistent with interstitial edema. Additionally, she has a ulceration to her L foot, does not seem infected. No drainage.   Currently, sitting up and breathing ok on [place] 4L/min. C/o dyspnea. Denies CP, abdominal pain, fever, N/V.  From renal standpoint, supposed to dialyze TTS at [facility]. Last HD was 12/31 (Monday-holiday schedule) which she completed in entirety, but left nearly 6kg above EDW after treatment (came in 13kg up, pulled 7.7L). Apparently was offered an extra HD, but she declined. Has been using her AVG without issues.   Past Medical History:  Diagnosis Date  . Anemia   . Anxiety   . Bipolar disorder, unspecified ([place]) 02/06/2014  . Chronic combined systolic and diastolic CHF (congestive heart failure) (HCC)    EF 40% by echo and 48% by stress test  . Chronic pain syndrome   . Depression   . ESRD on hemodialysis ([place])   . GERD  (gastroesophageal reflux disease)   . Glaucoma   . Headache    sinus headaches  . Hepatitis C   . High cholesterol   . History of hiatal hernia   . History of vitamin D deficiency 06/20/2015  . Hypertension   . MRSA infection ?2014   "on the back of my head; spread throughout my bloodstream"  . Neuropathy   . Refusal of blood transfusions as patient is Jehovah's Witness   . Schizoaffective disorder, unspecified condition 02/08/2014  . Seizure [facility]) dx'd 2014   "don't know what kind; last one was ~ 04/2014"  . Stroke [facility])    TIA 2009  . TIA (transient ischemic attack) 2010  . Type II diabetes mellitus ([place])    INSULIN DEPENDENT   Past Surgical History:  Procedure Laterality Date  . ABDOMINAL HYSTERECTOMY  2014  . ABDOMINAL HYSTERECTOMY  2010  . AMPUTATION Left 05/21/2013   Procedure: Left Midfoot AMPUTATION;  Surgeon: Newt Minion, MD;  Location: [place];  Service: Orthopedics;  Laterality: Left;  Left Midfoot Amputation  . AV FISTULA PLACEMENT Left 05/04/2015   Procedure: ARTERIOVENOUS (AV) GRAFT INSERTION;  Surgeon: Angelia Mould, MD;  Location: [place];  Service: Vascular;  Laterality: Left;  . COLONOSCOPY WITH PROPOFOL N/A 06/22/2013   Procedure: COLONOSCOPY WITH PROPOFOL;  Surgeon: Jeryl Columbia, MD;  Location: [facility] ENDOSCOPY;  Service: Endoscopy;  Laterality: N/A;  . ESOPHAGOGASTRODUODENOSCOPY N/A 05/11/2015   Procedure: ESOPHAGOGASTRODUODENOSCOPY (EGD);  Surgeon: Laurence Spates, MD;  Location: [place];  Service: Endoscopy;  Laterality: N/A;  . EYE SURGERY Bilateral 2010   Lasik  . FLEXIBLE SIGMOIDOSCOPY N/A 01/17/2017   Procedure: FLEXIBLE SIGMOIDOSCOPY w/ hemorrhoid banding possible;  Surgeon: Gatha Mayer, MD;  Location: [facility] ENDOSCOPY;  Service: Endoscopy;  Laterality: N/A;  . FOOT AMPUTATION Left    due diabetic neuropathy, could not feel ulcer on bottom of foot  . REFRACTIVE SURGERY Bilateral 2013  . TONSILLECTOMY  1970's  . TUBAL LIGATION  2000   Family  History  Problem Relation Age of Onset  . Hypertension Mother   . Diabetes Mother   . Hypertension Father   . Diabetes Father    Social History:  reports that she has been smoking cigarettes.  She has a 3.00 pack-year smoking history. she has never used smokeless tobacco. She reports that she drinks about 1.8 oz of alcohol per week. She reports that she uses drugs. Drugs: "Crack" cocaine and Cocaine.  ROS: As per HPI otherwise negative.  Physical Exam: Vitals:   08/07/17 1500 08/07/17 1515 08/07/17 1530 08/07/17 1600  BP: (!) 157/73 (!) 159/72 (!) 152/75 (!) 177/75  Pulse: 80 82 82 88  Resp: (!) 25 (!) '25 19 11  '$ Temp:      TempSrc:      SpO2: 92% 93% 93% (!) 84%  Weight:      Height:         General: Well developed female. On nasal oxygen, occ heavy breathing. + facial edema. Head: Normocephalic, atraumatic, sclera non-icteric, mucus membranes are moist. Neck: Supple without lymphadenopathy/masses. JVD elevated. Lungs: Coarse breath sounds/rhonchi throughout all lung fields. Heart: RRR; 2/6 systolic murmur Abdomen: Soft, non-tender, but distended. Musculoskeletal:  Strength and tone appear normal for age. Lower extremities: 3+ pitting LE edema. L TMA stump, ~2cm shallow round ulceration on L foot. Neuro: Alert and oriented X 3. Moves all extremities spontaneously. Psych:  Responds to questions appropriately with a normal affect. Dialysis Access: L forearm loop AVG + bruit  Allergies  Allergen Reactions  . Penicillins Hives and Other (See Comments)    Has patient had a PCN reaction causing immediate rash, facial/tongue/throat swelling, SOB or lightheadedness with hypotension: Yes Has patient had a PCN reaction causing severe rash involving mucus membranes or skin necrosis: No Has patient had a PCN reaction that required hospitalization No Has patient had a PCN reaction occurring within the last 10 years: No If all of the above answers are "NO", then may proceed with  Cephalosporin use. Pt tolerates cefepime and ceftriaxone  . Gabapentin Itching and Swelling  . Lyrica [Pregabalin] Swelling and Other (See Comments)    Facial and leg swelling  . Saphris [Asenapine] Swelling and Other (See Comments)    Facial swelling  . Tramadol Swelling and Other (See Comments)    Leg swelling  . Other Other (See Comments)    NO BLOOD PRODUCTS. PT IS A JEHOVAH WITNESS   Prior to Admission medications   Medication Sig Start Date End Date Taking? Authorizing Provider  albuterol (PROVENTIL HFA;VENTOLIN HFA) 108 (90 Base) MCG/ACT inhaler Inhale 2 puffs into the lungs every 4 (four) hours as needed for wheezing or shortness of breath. 10/15/16  Yes Horton, Barbette Hair, MD  allopurinol (ZYLOPRIM) 100 MG tablet Take 1 tablet (100 mg total) by mouth every Tuesday, Thursday, and Saturday at 6 PM. 01/25/17  Yes Nita Sells, MD  atorvastatin (LIPITOR) 40 MG tablet Take 1 tablet (40 mg total) by mouth at bedtime. 09/02/16  Yes Hildred Priest, MD  calcium acetate (PHOSLO) 667 MG capsule Take 2 capsules (1,334 mg total) by mouth 3 (three) times daily with meals. Patient taking differently: Take 667 mg by mouth 3 (three) times daily with meals.  09/02/16  Yes Hildred Priest, MD  carvedilol (COREG) 25 MG tablet Take 1 tablet (25 mg total) by mouth 2 (two) times daily with a meal. 09/02/16  Yes Hildred Priest, MD  cloNIDine (CATAPRES) 0.1 MG tablet Take 1 tablet (0.1 mg total) by mouth every 8 (eight) hours. 09/02/16  Yes Hildred Priest, MD  diphenhydrAMINE (BENADRYL) 25 mg capsule Take 25 mg by mouth every 6 (six) hours as needed for itching. Patient is given one capsule on dialysis days   Yes [provider]  hydrocortisone (ANUSOL-HC) 2.5 % rectal cream Place rectally 2 (two) times daily. Patient taking differently: Place 1 application rectally daily as needed.  01/24/17  Yes Nita Sells, MD  insulin aspart (NOVOLOG) 100  UNIT/ML injection Inject 7 Units into the skin 3 (three) times daily with meals. 06/07/17  Yes Helberg, Larkin Ina, MD  insulin glargine (LANTUS) 100 UNIT/ML injection Inject 0.2 mLs (20 Units total) into the skin at bedtime. Patient taking differently: Inject 25 Units into the skin at bedtime.  06/07/17  Yes Helberg, Larkin Ina, MD  LORazepam (ATIVAN) 0.5 MG tablet Take 1 tablet (0.5 mg total) by mouth every 8 (eight) hours as needed for anxiety. 07/19/17  Yes Mariel Aloe, MD  multivitamin (RENA-VIT) TABS tablet Take 1 tablet by mouth at bedtime. 09/02/16  Yes Hildred Priest, MD  mupirocin ointment (BACTROBAN) 2 % Apply 1 application topically daily. 03/06/17  Yes [provider]  Nutritional Supplements (FEEDING SUPPLEMENT, NEPRO CARB STEADY,) LIQD Take 237 mLs by mouth 3 (three) times daily between meals. 02/28/17  Yes Mikhail, Velta Addison, DO  Oxycodone HCl 10 MG TABS Take 1 tablet (10 mg total) by mouth every 6 (six) hours as needed. 07/19/17  Yes Mariel Aloe, MD  pantoprazole (PROTONIX) 40 MG tablet Take 1 tablet (40 mg total) by mouth daily at 6 (six) AM. 07/20/17  Yes Mariel Aloe, MD  polyethylene glycol (MIRALAX / GLYCOLAX) packet Take 17 g by mouth 3 (three) times daily.   Yes [provider]  sertraline (ZOLOFT) 50 MG tablet Take 1 tablet (50 mg total) by mouth daily. 07/19/17  Yes Mariel Aloe, MD  traZODone (DESYREL) 50 MG tablet Take 0.5 tablets (25 mg total) by mouth at bedtime. 07/19/17  Yes Mariel Aloe, MD  vitamin C (ASCORBIC ACID) 500 MG tablet Take 500 mg by mouth daily.   Yes [provider]  amLODipine (NORVASC) 10 MG tablet Take 1 tablet (10 mg total) by mouth at bedtime. 09/02/16   Hildred Priest, MD  blood glucose meter kit and supplies KIT Dispense based on patient and insurance preference. Use up to four times daily as directed. (FOR ICD-9 250.00, 250.01). 06/07/17   Ina Homes, MD  Ferrous Sulfate (SLOW FE) 142 (45 Fe)  MG TBCR Take 1 tablet by mouth daily. Patient not taking: Reported on 08/07/2017 07/19/17   Mariel Aloe, MD   No current facility-administered medications for this encounter.    Current Outpatient Medications  Medication Sig Dispense Refill  . albuterol (PROVENTIL HFA;VENTOLIN HFA) 108 (90 Base) MCG/ACT inhaler Inhale 2 puffs into the lungs every 4 (four) hours as needed for wheezing or shortness of breath. 1 Inhaler 0  . allopurinol (ZYLOPRIM) 100 MG tablet Take 1 tablet (100 mg total) by mouth every  Tuesday, Thursday, and Saturday at 6 PM. 60 tablet 1  . atorvastatin (LIPITOR) 40 MG tablet Take 1 tablet (40 mg total) by mouth at bedtime. 30 tablet 0  . calcium acetate (PHOSLO) 667 MG capsule Take 2 capsules (1,334 mg total) by mouth 3 (three) times daily with meals. (Patient taking differently: Take 667 mg by mouth 3 (three) times daily with meals. ) 240 capsule 0  . carvedilol (COREG) 25 MG tablet Take 1 tablet (25 mg total) by mouth 2 (two) times daily with a meal. 60 tablet 0  . cloNIDine (CATAPRES) 0.1 MG tablet Take 1 tablet (0.1 mg total) by mouth every 8 (eight) hours. 90 tablet 11  . diphenhydrAMINE (BENADRYL) 25 mg capsule Take 25 mg by mouth every 6 (six) hours as needed for itching. Patient is given one capsule on dialysis days    . hydrocortisone (ANUSOL-HC) 2.5 % rectal cream Place rectally 2 (two) times daily. (Patient taking differently: Place 1 application rectally daily as needed. ) 30 g 0  . insulin aspart (NOVOLOG) 100 UNIT/ML injection Inject 7 Units into the skin 3 (three) times daily with meals. 5 mL 0  . insulin glargine (LANTUS) 100 UNIT/ML injection Inject 0.2 mLs (20 Units total) into the skin at bedtime. (Patient taking differently: Inject 25 Units into the skin at bedtime. ) 7 mL 0  . LORazepam (ATIVAN) 0.5 MG tablet Take 1 tablet (0.5 mg total) by mouth every 8 (eight) hours as needed for anxiety. 10 tablet 0  . multivitamin (RENA-VIT) TABS tablet Take 1 tablet by  mouth at bedtime. 30 tablet 0  . mupirocin ointment (BACTROBAN) 2 % Apply 1 application topically daily.  0  . Nutritional Supplements (FEEDING SUPPLEMENT, NEPRO CARB STEADY,) LIQD Take 237 mLs by mouth 3 (three) times daily between meals. 90 Can 0  . Oxycodone HCl 10 MG TABS Take 1 tablet (10 mg total) by mouth every 6 (six) hours as needed. 10 tablet 0  . pantoprazole (PROTONIX) 40 MG tablet Take 1 tablet (40 mg total) by mouth daily at 6 (six) AM. 30 tablet 0  . polyethylene glycol (MIRALAX / GLYCOLAX) packet Take 17 g by mouth 3 (three) times daily.    . sertraline (ZOLOFT) 50 MG tablet Take 1 tablet (50 mg total) by mouth daily. 30 tablet 0  . traZODone (DESYREL) 50 MG tablet Take 0.5 tablets (25 mg total) by mouth at bedtime. 30 tablet 0  . vitamin C (ASCORBIC ACID) 500 MG tablet Take 500 mg by mouth daily.    Marland Kitchen amLODipine (NORVASC) 10 MG tablet Take 1 tablet (10 mg total) by mouth at bedtime. 30 tablet 0  . blood glucose meter kit and supplies KIT Dispense based on patient and insurance preference. Use up to four times daily as directed. (FOR ICD-9 250.00, 250.01). 1 each 0  . Ferrous Sulfate (SLOW FE) 142 (45 Fe) MG TBCR Take 1 tablet by mouth daily. (Patient not taking: Reported on 08/07/2017) 30 tablet 0   Labs: Basic Metabolic Panel: Recent Labs  Lab 08/07/17 0948  NA 136  K 5.3*  CL 88*  CO2 36*  GLUCOSE 237*  BUN 38*  CREATININE 5.45*  CALCIUM 8.9   CBC: Recent Labs  Lab 08/07/17 0948  WBC 1.6*  HGB 6.7*  HCT 22.9*  MCV 100.4*  PLT 160   CBG: Recent Labs  Lab 08/07/17 1146  GLUCAP 219*   Studies/Results: Dg Chest 2 View  Result Date: 08/07/2017 CLINICAL DATA:  Shortness of  breath, cough for 5 days EXAM: CHEST  2 VIEW COMPARISON:  07/16/2017 FINDINGS: Cardiomegaly with vascular congestion. Interstitial prominence likely reflects mild interstitial edema. No significant effusions. No acute bony abnormality. IMPRESSION: Cardiomegaly with vascular congestion and  suspected mild interstitial edema. Electronically Signed   By: Rolm Baptise M.D.   On: 08/07/2017 11:01   Dialysis Orders:  TTS at [facility] 3:30hr, 400/800, EDW 63kg, 2K/2.25Ca bath, AVG, no heparin - Mircera 244mg IV q 2 weeks (last 12/31) - Venofer '100mg'$  x 5 ordered (2 given so far) - Hectoral 254m IV q HD  Assessment/Plan: 1.  Acute Respiratory Failure/Volume overload: On nasal oxygen. Will plan to dialyze as soon as feasible. Will likely need serial HD to correct volume down to comfortable goal. 2.  ESRD: Usually TTS schedule. As above, will likely need serial HD for few days to get volume down. 3.  Hypertension/volume: BP high with excess volume, as above. 4.  Anemia (Jehovah's Witness/refuses blood products): Hgb 6.7 actually improved from prior check at HD (5.9 on 12/27). ESA just given earlier this week. Will continue IV course with HD. 5.  Metabolic bone disease: Ca ok, Phos pending. Resume binders (renvela)/VDRA. 6.  Leukopenia: Unclear cause? Follow. 7.  L foot ulceration: Does not appear infected, wound care per primary. 8.  Type 2 DM: Per primary 9.  Disposition: Pt requesting going to SNF after this admission, needs help with ADLs. Would involve case maFreight forwarder KaVeneta PentonPA-C 08/07/2017, 4:16 PM  [facility] Pager: [phone] Attending: SHMarlana Salvageas marked volume overload due to non adherence to her dialysis program.  Will try to aggressively remove fluid.  She describes mobility issues in LEs which will be best clarified after some volume removal. AlEstanislado Emms

## 2017-08-08 ENCOUNTER — Encounter (HOSPITAL_COMMUNITY): Payer: Self-pay | Admitting: *Deleted

## 2017-08-08 ENCOUNTER — Other Ambulatory Visit (HOSPITAL_COMMUNITY): Payer: Self-pay

## 2017-08-08 LAB — CBC WITH DIFFERENTIAL/PLATELET
BASOS PCT: 2 %
Basophils Absolute: 0 10*3/uL (ref 0.0–0.1)
EOS ABS: 0 10*3/uL (ref 0.0–0.7)
EOS PCT: 2 %
HCT: 24.5 % — ABNORMAL LOW (ref 36.0–46.0)
Hemoglobin: 7.1 g/dL — ABNORMAL LOW (ref 12.0–15.0)
Lymphocytes Relative: 28 %
Lymphs Abs: 0.5 10*3/uL — ABNORMAL LOW (ref 0.7–4.0)
MCH: 29 pg (ref 26.0–34.0)
MCHC: 29 g/dL — AB (ref 30.0–36.0)
MCV: 100 fL (ref 78.0–100.0)
MONO ABS: 0.1 10*3/uL (ref 0.1–1.0)
MONOS PCT: 7 %
Neutro Abs: 1.2 10*3/uL — ABNORMAL LOW (ref 1.7–7.7)
Neutrophils Relative %: 61 %
PLATELETS: 218 10*3/uL (ref 150–400)
RBC: 2.45 MIL/uL — ABNORMAL LOW (ref 3.87–5.11)
RDW: 17.5 % — AB (ref 11.5–15.5)
WBC: 1.9 10*3/uL — ABNORMAL LOW (ref 4.0–10.5)

## 2017-08-08 LAB — BASIC METABOLIC PANEL
Anion gap: 13 (ref 5–15)
BUN: 20 mg/dL (ref 6–20)
CALCIUM: 8.8 mg/dL — AB (ref 8.9–10.3)
CHLORIDE: 91 mmol/L — AB (ref 101–111)
CO2: 32 mmol/L (ref 22–32)
CREATININE: 3.44 mg/dL — AB (ref 0.44–1.00)
GFR calc Af Amer: 17 mL/min — ABNORMAL LOW (ref >60)
GFR, EST NON AFRICAN AMERICAN: 14 mL/min — AB (ref >60)
Glucose, Bld: 300 mg/dL — ABNORMAL HIGH (ref 65–99)
Potassium: 4.1 mmol/L (ref 3.5–5.1)
SODIUM: 136 mmol/L (ref 135–145)

## 2017-08-08 LAB — GLUCOSE, CAPILLARY
GLUCOSE-CAPILLARY: 113 mg/dL — AB (ref 65–99)
GLUCOSE-CAPILLARY: 210 mg/dL — AB (ref 65–99)
GLUCOSE-CAPILLARY: 292 mg/dL — AB (ref 65–99)
GLUCOSE-CAPILLARY: 71 mg/dL (ref 65–99)

## 2017-08-08 LAB — HEMOGLOBIN A1C
HEMOGLOBIN A1C: 5.9 % — AB (ref 4.8–5.6)
Mean Plasma Glucose: 122.63 mg/dL

## 2017-08-08 MED ORDER — HEPARIN SODIUM (PORCINE) 5000 UNIT/ML IJ SOLN
5000.0000 [IU] | Freq: Three times a day (TID) | INTRAMUSCULAR | Status: DC
  Administered 2017-08-09 – 2017-08-11 (×8): 5000 [IU] via SUBCUTANEOUS
  Filled 2017-08-08 (×9): qty 1

## 2017-08-08 MED ORDER — HEPARIN SODIUM (PORCINE) 5000 UNIT/ML IJ SOLN
5000.0000 [IU] | Freq: Three times a day (TID) | INTRAMUSCULAR | Status: DC
  Filled 2017-08-08 (×2): qty 1

## 2017-08-08 MED ORDER — SENNA 8.6 MG PO TABS
1.0000 | ORAL_TABLET | Freq: Every day | ORAL | Status: DC | PRN
  Administered 2017-08-08 – 2017-08-11 (×3): 8.6 mg via ORAL
  Filled 2017-08-08 (×3): qty 1

## 2017-08-08 NOTE — Progress Notes (Signed)
[facility] Progress Note   Subjective:  Seen in room, s/p HD last night with 7L UF. Feels a lot better, but still has significant edema and requiring oxygen 4L/min. Will plan for HD again today. No CP and leg pain improving.    Objective Vitals:   08/07/17 2330 08/07/17 2343 08/08/17 0032 08/08/17 0601  BP: (!) 184/96 (!) 177/91 (!) 169/68 137/63  Pulse: 82 92 92 90  Resp:  (!) 24 (!) 21   Temp:  98.1 F (36.7 C) 98.5 F (36.9 C) 98.5 F (36.9 C)  TempSrc:  Oral Oral Oral  SpO2:  100% 98% 95%  Weight:  69.2 kg (152 lb 8.9 oz) 69.1 kg (152 lb 5.4 oz)   Height:       Physical Exam General: Well developed female, facial edema improving Heart: RRR; 2/6 systolic murmur Lungs: Coarse throughout, no rales Abdomen: soft, non-tender Extremities: 3+ pitting LE edema Dialysis Access: L forearm loop AVG + bruit  Additional Objective Labs: Basic Metabolic Panel: Recent Labs  Lab 08/07/17 0948 08/08/17 0045  NA 136 136  K 5.3* 4.1  CL 88* 91*  CO2 36* 32  GLUCOSE 237* 300*  BUN 38* 20  CREATININE 5.45* 3.44*  CALCIUM 8.9 8.8*   CBC: Recent Labs  Lab 08/07/17 0948 08/08/17 0045  WBC 1.6* 1.9*  NEUTROABS  --  1.2*  HGB 6.7* 7.1*  HCT 22.9* 24.5*  MCV 100.4* 100.0  PLT 160 218   Studies/Results: Dg Chest 2 View  Result Date: 08/07/2017 CLINICAL DATA:  Shortness of breath, cough for 5 days EXAM: CHEST  2 VIEW COMPARISON:  07/16/2017 FINDINGS: Cardiomegaly with vascular congestion. Interstitial prominence likely reflects mild interstitial edema. No significant effusions. No acute bony abnormality. IMPRESSION: Cardiomegaly with vascular congestion and suspected mild interstitial edema. Electronically Signed   By: Rolm Baptise M.D.   On: 08/07/2017 11:01   Medications: . sodium chloride    . sodium chloride    . sodium chloride    . [START ON 08/09/2017] ferric gluconate (FERRLECIT/NULECIT) IV     . [START ON 08/09/2017] allopurinol  100 mg Oral Q  T,Th,Sat-1800  . aspirin EC  81 mg Oral Daily  . atorvastatin  40 mg Oral QHS  . calcium acetate  667 mg Oral TID WC  . carvedilol  25 mg Oral BID WC  . cloNIDine  0.1 mg Oral Q8H  . [START ON 08/09/2017] doxercalciferol  2 mcg Intravenous Q T,Th,Sa-HD  . enoxaparin (LOVENOX) injection  30 mg Subcutaneous Daily  . feeding supplement (NEPRO CARB STEADY)  237 mL Oral TID BM  . ferrous sulfate  325 mg Oral Q breakfast  . insulin aspart  0-15 Units Subcutaneous TID WC  . insulin aspart  0-5 Units Subcutaneous QHS  . insulin aspart  7 Units Subcutaneous TID WC  . insulin glargine  20 Units Subcutaneous QHS  . lisinopril  5 mg Oral Daily  . multivitamin  1 tablet Oral QHS  . pantoprazole  40 mg Oral Q0600  . sertraline  50 mg Oral Daily  . sevelamer carbonate  2,400 mg Oral TID WC  . sodium chloride flush  3 mL Intravenous Q12H  . traZODone  25 mg Oral QHS    Dialysis Orders: TTS at [facility] 3:30hr, 400/800, EDW 63kg, 2K/2.25Ca bath, AVG, no heparin - Mircera 270mcg IV q 2 weeks (last 12/31) - Venofer 100mg  x 5 ordered (2 given so far) - Hectoral 60mcg IV q HD  Assessment/Plan:  1.  Acute Respiratory Failure/Volume overload: S/p urgent HD with 7L UF yesterday, for further HD today for volume reduction. 2.  ESRD: Usually TTS schedule. As above, plan for serial HD for few days to get volume down. 3.  Hypertension/volume: BP improving with volume removal. Follow. 4.  Anemia (Jehovah's Witness/refuses blood products): Hgb 7.1 (stable for her). ESA just given earlier this week. Will continue IV course with HD. 5.  Metabolic bone disease: Ca ok, Phos pending. Resume binders (Renvela/Phoslo)/VDRA. 6.  Leukopenia: Unclear cause? Follow. 7.  L foot ulceration: Does not appear infected, wound care per primary. 8.  Type 2 DM: Per primary 9.  Disposition: Pt requesting going to SNF after this admission, needs help with ADLs. Would involve case Freight forwarder.    Veneta Penton, PA-C 08/08/2017, 9:25 AM   [facility] Pager: [phone]  Renal Attending: As above, marked vol removal yesterday with improvement and needs another treatment.  I had a long discussion with her regarding her adherence. Estanislado Emms

## 2017-08-08 NOTE — Evaluation (Signed)
Occupational Therapy Evaluation Patient Details Name: [name] MRN: [id number] DOB: February 22, 1966 Today's Date: 08/08/2017    History of Present Illness [name] is a 52 y.o. female with medical history significant of ESRD, acute on chronic heart failure presenting with her "Legs are starting not to work.". dyspnea   Clinical Impression   Pt with decline in function and safety with ADLs and ADL mobility with decreased strength, balance and endurance. Pt ,imited by LE pain and fatigues easily. Pt with hx of decreased R elbow AROM from old fracture. Pt reports that she would like to go to SNF for further therapy after acute d/c and states "I will die in that house alone during the day" Pt very worried about being at home alone a she has had falls, burned herself while cooking and that she's getting weaker and weaker. Pt would benefit from acute OT services to address impairments to maximize level of function and safety    Follow Up Recommendations  SNF    Equipment Recommendations  Other (comment)(TBD at next venue of care)    Recommendations for Other Services       Precautions / Restrictions Precautions Precautions: Fall Restrictions Weight Bearing Restrictions: No      Mobility Bed Mobility Overal bed mobility: Needs Assistance Bed Mobility: Supine to Sit;Sit to Supine     Supine to sit: Min guard Sit to supine: Min guard      Transfers Overall transfer level: Needs assistance Equipment used: Rolling walker (2 wheeled);None Transfers: Sit to/from Stand Sit to Stand: Min assist         General transfer comment: for safety    Balance Overall balance assessment: Needs assistance Sitting-balance support: Feet supported;No upper extremity supported Sitting balance-Leahy Scale: Fair     Standing balance support: During functional activity;Single extremity supported Standing balance-Leahy Scale: Poor                             ADL either  performed or assessed with clinical judgement   ADL Overall ADL's : Needs assistance/impaired Eating/Feeding: Independent;Sitting   Grooming: Wash/dry hands;Wash/dry face;Sitting;Min guard   Upper Body Bathing: Min guard;Sitting   Lower Body Bathing: Moderate assistance   Upper Body Dressing : Min guard;Sitting   Lower Body Dressing: Moderate assistance   Toilet Transfer: Minimal assistance;Cueing for safety(simulated to chair)   Toileting- Clothing Manipulation and Hygiene: Moderate assistance;Sit to/from stand       Functional mobility during ADLs: Minimal assistance General ADL Comments: pt required increased time to complete activities, very poor endurance/fatigues easily     Vision Baseline Vision/History: Wears glasses Wears Glasses: Reading only Patient Visual Report: No change from baseline       Perception     Praxis      Pertinent Vitals/Pain Pain Assessment: 0-10 Pain Score: 7  Pain Location: LEs Pain Descriptors / Indicators: Shooting;Pressure Pain Intervention(s): Limited activity within patient's tolerance;Monitored during session;Repositioned     Hand Dominance Left   Extremity/Trunk Assessment Upper Extremity Assessment Upper Extremity Assessment: Generalized weakness;RUE deficits/detail RUE Deficits / Details: hx of elbow fracture not healing properly, decreased AROM    Lower Extremity Assessment Lower Extremity Assessment: Defer to PT evaluation       Communication Communication Communication: No difficulties   Cognition Arousal/Alertness: Awake/alert Behavior During Therapy: WFL for tasks assessed/performed Overall Cognitive Status: Within Functional Limits for tasks assessed  General Comments       Exercises     Shoulder Instructions      Home Living Family/patient expects to be discharged to:: Private residence Living Arrangements: Children Available Help at Discharge:  Family;Available PRN/intermittently Type of Home: House Home Access: Stairs to enter [employer] of Steps: 5 Entrance Stairs-Rails: Right;Left Home Layout: One level     Bathroom Shower/Tub: Teacher, early years/pre: Standard     Home Equipment: Environmental consultant - 2 wheels          Prior Functioning/Environment    Gait / Transfers Assistance Needed: ambulates with RW ADL's / Homemaking Assistance Needed: pt reports that she bathes and dresses herself wihtout assist            OT Problem List: Decreased strength;Decreased activity tolerance;Decreased knowledge of use of DME or AE;Increased edema;Decreased range of motion;Impaired balance (sitting and/or standing);Decreased coordination;Pain      OT Treatment/Interventions: Self-care/ADL training;DME and/or AE instruction;Therapeutic activities;Therapeutic exercise;Patient/family education;Energy conservation    OT Goals(Current goals can be found in the care plan section) Acute Rehab OT Goals Patient Stated Goal: go to SNF for rehab OT Goal Formulation: With patient Time For Goal Achievement: 08/22/17 Potential to Achieve Goals: Good ADL Goals Pt Will Perform Grooming: with supervision;with set-up;sitting Pt Will Perform Upper Body Bathing: with supervision;with set-up;sitting Pt Will Perform Lower Body Bathing: with min assist;sitting/lateral leans;sit to/from stand Pt Will Perform Upper Body Dressing: with supervision;with set-up;sitting Pt Will Transfer to Toilet: with min assist;ambulating Pt Will Perform Toileting - Clothing Manipulation and hygiene: with min guard assist;sit to/from stand  OT Frequency: Min 2X/week   Barriers to D/C:            Co-evaluation              AM-PAC PT "6 Clicks" Daily Activity     Outcome Measure Help from another person eating meals?: None Help from another person taking care of personal grooming?: A Little Help from another person toileting, which includes  using toliet, bedpan, or urinal?: A Lot Help from another person bathing (including washing, rinsing, drying)?: A Lot Help from another person to put on and taking off regular upper body clothing?: A Little Help from another person to put on and taking off regular lower body clothing?: A Lot 6 Click Score: 16   End of Session Equipment Utilized During Treatment: Gait belt;Rolling walker  Activity Tolerance: Patient limited by fatigue Patient left: in bed;with call [place]/phone within reach;with bed alarm set  OT Visit Diagnosis: Unsteadiness on feet (R26.81);History of falling (Z91.81);Muscle weakness (generalized) (M62.81);Pain Pain - Right/Left: (bilateral) Pain - part of body: Leg                Time: 1346-1410 OT Time Calculation (min): 24 min Charges:  OT General Charges $OT Visit: 1 Visit OT Evaluation $OT Eval Moderate Complexity: 1 Mod OT Treatments $Therapeutic Activity: 8-22 mins G-Codes: OT G-codes **NOT FOR INPATIENT CLASS** Functional Assessment Tool Used: AM-PAC 6 Clicks Daily Activity     Britt Bottom 08/08/2017, 2:39 PM

## 2017-08-08 NOTE — Evaluation (Signed)
Physical Therapy Evaluation Patient Details Name: [name] MRN: [id number] DOB: 1965/12/04 Today's Date: 08/08/2017   History of Present Illness  [name] is a 52 y.o. female with ESRD, HTN, Bipolar disorder, HFrEF (45-50%), GERD, Hep C, Type 2 DM, Hx CVA, and Jehovah's Witness who is being admitted with dyspnea/volume overload.    Clinical Impression  Pt presented supine in bed with HOB elevated, awake and willing to participate in therapy session. Prior to admission, pt reported that she ambulates with use of RW but has frequent falls. Pt lives with her son who is gone during the day. Pt currently limited secondary to fatigue and weakness. Pt just completing OT evaluation prior to PT arrival and even more fatigued; therefore, only able to tolerate bed mobility and sitting EOB. Pt is very interested in [place] SNF for rehab to get stronger. Pt would continue to benefit from skilled physical therapy services at this time while admitted and after d/c to address the below listed limitations in order to improve overall safety and independence with functional mobility.     Follow Up Recommendations SNF;Supervision/Assistance - 24 hour    Equipment Recommendations  None recommended by PT    Recommendations for Other Services       Precautions / Restrictions Precautions Precautions: Fall Restrictions Weight Bearing Restrictions: No      Mobility  Bed Mobility Overal bed mobility: Needs Assistance Bed Mobility: Supine to Sit;Sit to Supine     Supine to sit: Min guard Sit to supine: Min guard   General bed mobility comments: min guard for safety, pt using bilateral UEs to assist bilateral LEs with movement  Transfers Overall transfer level: Needs assistance Equipment used: Rolling walker (2 wheeled);None Transfers: Sit to/from Stand Sit to Stand: Min assist         General transfer comment: defer at this time as pt just completed OT evaluation and was fatigued with plans  for HD later today  Ambulation/Gait                Stairs            Wheelchair Mobility    Modified Rankin (Stroke Patients Only)       Balance Overall balance assessment: Needs assistance Sitting-balance support: Feet supported Sitting balance-Leahy Scale: Fair     Standing balance support: During functional activity;Single extremity supported Standing balance-Leahy Scale: Poor                               Pertinent Vitals/Pain Pain Assessment: Faces Pain Score: 7  Faces Pain Scale: Hurts even more Pain Location: bilateral LEs Pain Descriptors / Indicators: Sore Pain Intervention(s): Monitored during session    Home Living Family/patient expects to be discharged to:: Private residence Living Arrangements: Children Available Help at Discharge: Family;Available PRN/intermittently Type of Home: House Home Access: Stairs to enter Entrance Stairs-Rails: Psychiatric nurse of Steps: 5 Home Layout: One level Home Equipment: Environmental consultant - 2 wheels      Prior Function Level of Independence: Needs assistance   Gait / Transfers Assistance Needed: ambulates with RW  ADL's / Homemaking Assistance Needed: pt reports that she bathes and dresses herself wihtout assist        Hand Dominance   Dominant Hand: Left    Extremity/Trunk Assessment   Upper Extremity Assessment Upper Extremity Assessment: Defer to OT evaluation RUE Deficits / Details: hx of elbow fracture not healing properly, decreased  AROM     Lower Extremity Assessment Lower Extremity Assessment: Generalized weakness(bilateral edema noted)       Communication   Communication: No difficulties  Cognition Arousal/Alertness: Awake/alert Behavior During Therapy: WFL for tasks assessed/performed Overall Cognitive Status: Within Functional Limits for tasks assessed                                        General Comments      Exercises      Assessment/Plan    PT Assessment Patient needs continued PT services  PT Problem List Decreased strength;Decreased activity tolerance;Decreased balance;Decreased mobility;Decreased coordination;Decreased safety awareness;Decreased knowledge of precautions;Cardiopulmonary status limiting activity       PT Treatment Interventions DME instruction;Gait training;Neuromuscular re-education;Patient/family education;Balance training;Therapeutic exercise;Therapeutic activities;Functional mobility training    PT Goals (Current goals can be found in the Care Plan section)  Acute Rehab PT Goals Patient Stated Goal: go to SNF for rehab PT Goal Formulation: With patient Time For Goal Achievement: 08/22/17 Potential to Achieve Goals: Fair    Frequency Min 2X/week   Barriers to discharge Decreased caregiver support      Co-evaluation               AM-PAC PT "6 Clicks" Daily Activity  Outcome Measure Difficulty turning over in bed (including adjusting bedclothes, sheets and blankets)?: A Little Difficulty moving from lying on back to sitting on the side of the bed? : A Little Difficulty sitting down on and standing up from a chair with arms (e.g., wheelchair, bedside commode, etc,.)?: Unable Help needed moving to and from a bed to chair (including a wheelchair)?: A Lot Help needed walking in hospital room?: A Lot Help needed climbing 3-5 steps with a railing? : Total 6 Click Score: 12    End of Session Equipment Utilized During Treatment: Oxygen Activity Tolerance: Patient limited by fatigue Patient left: in bed;with call [place]/phone within reach;with bed alarm set Nurse Communication: Mobility status PT Visit Diagnosis: Other abnormalities of gait and mobility (R26.89)    Time: 2257-5051 PT Time Calculation (min) (ACUTE ONLY): 15 min   Charges:   PT Evaluation $PT Eval Moderate Complexity: 1 Mod     PT G Codes:        [place], PT, DPT [place] 08/08/2017, 3:53 PM

## 2017-08-08 NOTE — NC FL2 (Signed)
[place] LEVEL OF CARE SCREENING TOOL     IDENTIFICATION  Patient Name: [name] Birthdate: [birth year] Sex: female Admission Date (Current Location): 08/07/2017  [place] and [place] Number:  Kathleen Argue [id number] [place] and Address:  The [place]. [facility], [place] [address], [place], [place] [address]      Provider Number: [id number]  Attending Physician Name and Address:  Velvet Bathe, MD  Relative Name and Phone Number:  Shenaya Lebo - son; [phone]    Current Level of Care: Hospital Recommended Level of Care: [place] Prior Approval Number:    Date Approved/Denied:   PASRR Number: (submitted for PASRR on 1/4 )  Discharge Plan: SNF    Current Diagnoses: Patient Active Problem List   Diagnosis Date Noted  . Lower GI bleed   . Evaluation by psychiatric service required 07/17/2017  . Volume overload 06/03/2017  . Right supracondylar humerus fracture, with nonunion, subsequent encounter 05/01/2017  . Malnutrition of moderate degree 02/28/2017  . Leg pain, left 02/25/2017  . Left leg pain 02/25/2017  . External hemorrhoids   . Diverticulosis of colon without hemorrhage   . Goals of care, counseling/discussion   . Palliative care encounter   . Cellulitis of left leg 01/16/2017  . Diabetes mellitus with complication ([place]) 76/28/3151  . Upper GI bleed 01/16/2017  . UGIB (upper gastrointestinal bleed)   . Anemia due to chronic kidney disease   . Cellulitis of left thigh 01/01/2017  . End stage renal disease ([place]) 01/01/2017  . Cocaine use disorder, severe, dependence ([place]) 08/29/2016  . Hepatitis C 08/29/2016  . Diabetic ulcer of calf ([place]) 08/29/2016  . Diabetic ulcer of toe ([place]) 08/29/2016  . Cerebrovascular accident (CVA) ([place]) 08/29/2016  . MDD (major depressive disorder) 08/28/2016  . Dyslipidemia associated with type 2 diabetes mellitus ([place]) 11/10/2015  . Hyperkalemia 09/17/2015  . History of vitamin D  deficiency 06/20/2015  . Long term prescription opiate use 06/20/2015  . Chronic pain syndrome 06/19/2015  . Anemia of chronic kidney failure 05/01/2015  . transmetatarsal amputation of the left foot 12/13/2014  . Unilateral visual loss 11/22/2014  . 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 ([place]) 11/22/2014  . Essential hypertension 11/09/2014  . Dizziness   . Chronic combined systolic (congestive) and diastolic (congestive) heart failure ([place])   . Rectal bleeding 04/23/2014  . Anemia, iron deficiency 04/13/2014  . Noncompliance 02/02/2014  . Hyponatremia 05/19/2013  . Seizure disorder ([place]) 02/09/2013    Orientation RESPIRATION BLADDER Height & Weight     Self, Time, Situation, Place  O2(4 liters Oxygen) Continent Weight: 152 lb 5.4 oz (69.1 kg) Height:  5\' 4"  (162.6 cm)  BEHAVIORAL SYMPTOMS/MOOD NEUROLOGICAL BOWEL NUTRITION STATUS    Convulsions/Seizures Continent Diet(Heart healthy)  AMBULATORY STATUS COMMUNICATION OF NEEDS Skin   Extensive Assist Verbally Other (Comment)(Left toe amputation; Burn on right leg; Diabetic wound on bottom of left foot)                       Personal Care Assistance Level of Assistance  Bathing, Feeding, Dressing Bathing Assistance: Maximum assistance Feeding assistance: Independent Dressing Assistance: Maximum assistance     Functional Limitations Info  Sight, Hearing, Speech Sight Info: Impaired(Patient has glaucoma) Hearing Info: Adequate Speech Info: Adequate    SPECIAL CARE FACTORS FREQUENCY  OT (By licensed OT)       OT Frequency: Evaluated 1/4 and a minimum of 2X per week therapy recommended during acute inpatient  stay            Contractures Contractures Info: Not present    Additional Factors Info  Code Status, Allergies Code Status Info: Full Allergies Info: Penicillins, Gapapentin, Lyrica, Saphris, Tramadol           Current Medications (08/08/2017):  This is the current hospital active medication  list Current Facility-Administered Medications  Medication Dose Route Frequency Provider Last Rate Last Dose  . 0.9 %  sodium chloride infusion  250 mL Intravenous PRN Karmen Bongo, MD      . 0.9 %  sodium chloride infusion  100 mL Intravenous PRN Loren Racer, PA-C      . 0.9 %  sodium chloride infusion  100 mL Intravenous PRN Loren Racer, PA-C      . acetaminophen (TYLENOL) tablet 650 mg  650 mg Oral Q4H PRN Karmen Bongo, MD      . albuterol (PROVENTIL) (2.5 MG/3ML) 0.083% nebulizer solution 2.5 mg  2.5 mg Inhalation Q4H PRN Karmen Bongo, MD      . Derrill Memo ON 08/09/2017] allopurinol (ZYLOPRIM) tablet 100 mg  100 mg Oral Q T,Th,Sat-1800 Karmen Bongo, MD      . alteplase (CATHFLO ACTIVASE) injection 2 mg  2 mg Intracatheter Once PRN Loren Racer, PA-C      . aspirin EC tablet 81 mg  81 mg Oral Daily Karmen Bongo, MD   81 mg at 08/08/17 0102  . atorvastatin (LIPITOR) tablet 40 mg  40 mg Oral Ivery Quale, MD   40 mg at 08/08/17 0054  . calcium acetate (PHOSLO) capsule 667 mg  667 mg Oral TID WC Karmen Bongo, MD   667 mg at 08/08/17 1301  . calcium carbonate (dosed in mg elemental calcium) suspension 500 mg of elemental calcium  500 mg of elemental calcium Oral Q6H PRN Karmen Bongo, MD      . carvedilol (COREG) tablet 25 mg  25 mg Oral BID WC Karmen Bongo, MD   25 mg at 08/08/17 0841  . cloNIDine (CATAPRES) tablet 0.1 mg  0.1 mg Oral Lynne Logan, MD   0.1 mg at 08/08/17 0602  . diphenhydrAMINE (BENADRYL) capsule 25 mg  25 mg Oral Q6H PRN Karmen Bongo, MD      . docusate sodium [facility]) enema 283 mg  1 enema Rectal PRN Karmen Bongo, MD      . Derrill Memo ON 08/09/2017] doxercalciferol (HECTOROL) injection 2 mcg  2 mcg Intravenous Q T,Th,Sa-HD Stovall, Kathryn R, PA-C      . enoxaparin (LOVENOX) injection 30 mg  30 mg Subcutaneous Daily Karmen Bongo, MD   30 mg at 08/08/17 1114  . feeding supplement (NEPRO CARB STEADY) liquid 237 mL  237 mL  Oral TID BM Karmen Bongo, MD   237 mL at 08/08/17 1307  . feeding supplement (NEPRO CARB STEADY) liquid 237 mL  237 mL Oral TID PRN Karmen Bongo, MD      . Derrill Memo ON 08/09/2017] ferric gluconate (NULECIT) 125 mg in sodium chloride 0.9 % 100 mL IVPB  125 mg Intravenous Q T,Th,Sa-HD Stovall, Kathryn R, PA-C      . ferrous sulfate tablet 325 mg  325 mg Oral Q breakfast Karmen Bongo, MD      . heparin injection 1,000 Units  1,000 Units Dialysis PRN Loren Racer, PA-C      . insulin aspart (novoLOG) injection 0-15 Units  0-15 Units Subcutaneous TID WC Karmen Bongo, MD   5 Units at 08/08/17 [phone]  .  insulin aspart (novoLOG) injection 0-5 Units  0-5 Units Subcutaneous QHS Karmen Bongo, MD   3 Units at 08/08/17 0054  . insulin aspart (novoLOG) injection 7 Units  7 Units Subcutaneous TID WC Karmen Bongo, MD   7 Units at 08/08/17 1301  . insulin glargine (LANTUS) injection 20 Units  20 Units Subcutaneous QHS Karmen Bongo, MD   20 Units at 08/08/17 0105  . lidocaine (PF) (XYLOCAINE) 1 % injection 5 mL  5 mL Intradermal PRN Loren Racer, PA-C      . lidocaine-prilocaine (EMLA) cream 1 application  1 application Topical PRN Loren Racer, PA-C      . lisinopril (PRINIVIL,ZESTRIL) tablet 5 mg  5 mg Oral Daily Karmen Bongo, MD   5 mg at 08/08/17 0059  . LORazepam (ATIVAN) tablet 0.5 mg  0.5 mg Oral Q8H PRN Karmen Bongo, MD      . multivitamin (RENA-VIT) tablet 1 tablet  1 tablet Oral QHS Karmen Bongo, MD      . ondansetron [facility]) injection 4 mg  4 mg Intravenous Q6H PRN Karmen Bongo, MD      . oxyCODONE (Oxy IR/ROXICODONE) immediate release tablet 10 mg  10 mg Oral Q6H PRN Karmen Bongo, MD   10 mg at 08/08/17 1307  . pantoprazole (PROTONIX) EC tablet 40 mg  40 mg Oral Q0600 Karmen Bongo, MD   40 mg at 08/08/17 0602  . pentafluoroprop-tetrafluoroeth (GEBAUERS) aerosol 1 application  1 application Topical PRN Loren Racer, PA-C      . senna (SENOKOT)  tablet 8.6 mg  1 tablet Oral Daily PRN Velvet Bathe, MD      . sertraline (ZOLOFT) tablet 50 mg  50 mg Oral Daily Karmen Bongo, MD      . sevelamer carbonate (RENVELA) tablet 2,400 mg  2,400 mg Oral TID WC Stephania Fragmin R, PA-C   2,400 mg at 08/08/17 1301  . sodium chloride flush (NS) 0.9 % injection 3 mL  3 mL Intravenous Q12H Karmen Bongo, MD   3 mL at 08/08/17 1115  . sodium chloride flush (NS) 0.9 % injection 3 mL  3 mL Intravenous PRN Karmen Bongo, MD      . sorbitol 70 % solution 30 mL  30 mL Oral PRN Karmen Bongo, MD      . traZODone (DESYREL) tablet 25 mg  25 mg Oral Ivery Quale, MD   25 mg at 08/08/17 0059  . zolpidem (AMBIEN) tablet 5 mg  5 mg Oral QHS PRN Karmen Bongo, MD         Discharge Medications: Please see discharge summary for a list of discharge medications.  Relevant Imaging Results:  Relevant Lab Results:   Additional Information ss#[id number].  Dialysis patient - TTS at [facility], [place], [place]

## 2017-08-08 NOTE — Progress Notes (Signed)
PROGRESS NOTE    [name]  NID:[id number] DOB: 1966-02-15 DOA: 08/07/2017 PCP: [name], MD   Brief Narrative:     Assessment & Plan:   Principal Problem:   Volume overload Active Problems:   Chronic combined systolic (congestive) and diastolic (congestive) heart failure (HCC)   Essential hypertension   Anemia of chronic kidney failure   Chronic pain syndrome   Cocaine use disorder, severe, dependence (HCC)   End stage renal disease (HCC)   Diabetes mellitus with complication (HCC)  Volume overload in the setting of chronic CHF and ESRD - Nephrology assisting with HD - volume management via HD  ESRD -Patient on chronic TTS HD -Nephrology on board and managing.  Chronic pain with cocaine use -For now will continue oxycodone prn without escalation.  DM -Will check A1c 5.9  -Continue basal-bolus insulin -Cover with moderate-scale SSI  Anemia -Patient is Jehovah's witness and will not accept blood products even at the risk of death -Hgb is currently stable  DVT prophylaxis: Was placed on lovenox but I have changed this to heparin due to ESRD Code Status: Full Family Communication: none Disposition Plan: d/c once cleared by nephrology   Consultants:   Nephrology  Procedures: HD  Antimicrobials: None   Subjective: Pt has no new complaints. Still somewhat sob.  Objective: Vitals:   08/08/17 1542 08/08/17 1600 08/08/17 1630 08/08/17 1700  BP: (!) 155/83 (!) 154/76 (!) 155/74 (!) 155/63  Pulse: 82 81 81 80  Resp:      Temp:      TempSrc:      SpO2:      Weight:      Height:        Intake/Output Summary (Last 24 hours) at 08/08/2017 1734 Last data filed at 08/08/2017 1114 Gross per 24 hour  Intake 423 ml  Output 7000 ml  Net -6577 ml   Filed Weights   08/07/17 2343 08/08/17 0032 08/08/17 1530  Weight: 69.2 kg (152 lb 8.9 oz) 69.1 kg (152 lb 5.4 oz) 70.9 kg (156 lb 4.9 oz)    Examination:  General exam: Appears calm and  comfortable, in nad. Respiratory system: decreased breath sounds at bases, Respiratory effort normal. Equal chest rise. Cardiovascular system: S1 & S2 heard, RRR. No JVD, murmurs, rubs, gallops or clicks. Gastrointestinal system: Abdomen is nondistended, soft and nontender. No organomegaly or masses felt. Normal bowel sounds heard. Central nervous system: Alert and oriented. No focal neurological deficits. Extremities: Symmetric 5 x 5 power. Skin: No rashes, lesions or ulcers Psychiatry:  Mood & affect appropriate.   Data Reviewed: I have personally reviewed following labs and imaging studies  CBC: Recent Labs  Lab 08/07/17 0948 08/08/17 0045  WBC 1.6* 1.9*  NEUTROABS  --  1.2*  HGB 6.7* 7.1*  HCT 22.9* 24.5*  MCV 100.4* 100.0  PLT 160 144   Basic Metabolic Panel: Recent Labs  Lab 08/07/17 0948 08/08/17 0045  NA 136 136  K 5.3* 4.1  CL 88* 91*  CO2 36* 32  GLUCOSE 237* 300*  BUN 38* 20  CREATININE 5.45* 3.44*  CALCIUM 8.9 8.8*   GFR: Estimated Creatinine Clearance: 18.7 mL/min (A) (by C-G formula based on SCr of 3.44 mg/dL (H)). Liver Function Tests: No results for input(s): AST, ALT, ALKPHOS, BILITOT, PROT, ALBUMIN in the last 168 hours. No results for input(s): LIPASE, AMYLASE in the last 168 hours. No results for input(s): AMMONIA in the last 168 hours. Coagulation Profile: No results for input(s): INR,  PROTIME in the last 168 hours. Cardiac Enzymes: No results for input(s): CKTOTAL, CKMB, CKMBINDEX, TROPONINI in the last 168 hours. BNP (last 3 results) No results for input(s): PROBNP in the last 8760 hours. HbA1C: Recent Labs    08/08/17 0045  HGBA1C 5.9*   CBG: Recent Labs  Lab 08/07/17 1146 08/08/17 0049 08/08/17 0757 08/08/17 1156  GLUCAP 219* 292* 210* 71   Lipid Profile: No results for input(s): CHOL, HDL, LDLCALC, TRIG, CHOLHDL, LDLDIRECT in the last 72 hours. Thyroid Function Tests: No results for input(s): TSH, T4TOTAL, FREET4, T3FREE,  THYROIDAB in the last 72 hours. Anemia Panel: No results for input(s): VITAMINB12, FOLATE, FERRITIN, TIBC, IRON, RETICCTPCT in the last 72 hours. Sepsis Labs: No results for input(s): PROCALCITON, LATICACIDVEN in the last 168 hours.  No results found for this or any previous visit (from the past 240 hour(s)).    Radiology Studies: Dg Chest 2 View  Result Date: 08/07/2017 CLINICAL DATA:  Shortness of breath, cough for 5 days EXAM: CHEST  2 VIEW COMPARISON:  07/16/2017 FINDINGS: Cardiomegaly with vascular congestion. Interstitial prominence likely reflects mild interstitial edema. No significant effusions. No acute bony abnormality. IMPRESSION: Cardiomegaly with vascular congestion and suspected mild interstitial edema. Electronically Signed   By: Rolm Baptise M.D.   On: 08/07/2017 11:01    Scheduled Meds: . [START ON 08/09/2017] allopurinol  100 mg Oral Q T,Th,Sat-1800  . aspirin EC  81 mg Oral Daily  . atorvastatin  40 mg Oral QHS  . calcium acetate  667 mg Oral TID WC  . carvedilol  25 mg Oral BID WC  . cloNIDine  0.1 mg Oral Q8H  . [START ON 08/09/2017] doxercalciferol  2 mcg Intravenous Q T,Th,Sa-HD  . enoxaparin (LOVENOX) injection  30 mg Subcutaneous Daily  . feeding supplement (NEPRO CARB STEADY)  237 mL Oral TID BM  . ferrous sulfate  325 mg Oral Q breakfast  . insulin aspart  0-15 Units Subcutaneous TID WC  . insulin aspart  0-5 Units Subcutaneous QHS  . insulin aspart  7 Units Subcutaneous TID WC  . insulin glargine  20 Units Subcutaneous QHS  . lisinopril  5 mg Oral Daily  . multivitamin  1 tablet Oral QHS  . pantoprazole  40 mg Oral Q0600  . sertraline  50 mg Oral Daily  . sevelamer carbonate  2,400 mg Oral TID WC  . sodium chloride flush  3 mL Intravenous Q12H  . traZODone  25 mg Oral QHS   Continuous Infusions: . sodium chloride    . sodium chloride    . sodium chloride    . [START ON 08/09/2017] ferric gluconate (FERRLECIT/NULECIT) IV       LOS: 1 day    Time  spent: > 35 minutes  Velvet Bathe, MD [facility] Pager [phone]  If 7PM-7AM, please contact night-coverage [email or web address] Password [facility] 08/08/2017, 5:34 PM

## 2017-08-08 NOTE — Progress Notes (Signed)
New Admission Note:   Arrival Method: Arrived from HD Mental Orientation: Alert and oriented x4 Telemetry: Box #5 Assessment: Completed Skin: See doc flowsheet IV: NSL-Rt H Pain: see doc flowsheet Tubes: N/A Safety Measures: Safety Fall Prevention Plan has been  discussed. Admission: Completed [address] Orientation: Patient has been orientated to the room, unit and staff.  Family: None at bedside  Orders have been reviewed and implemented. Will continue to monitor the patient. Call light has been placed within reach and bed alarm has been activated.   [employer], RN-BC Phone number: [phone]

## 2017-08-09 ENCOUNTER — Other Ambulatory Visit (HOSPITAL_COMMUNITY): Payer: Self-pay

## 2017-08-09 LAB — BASIC METABOLIC PANEL
ANION GAP: 10 (ref 5–15)
BUN: 17 mg/dL (ref 6–20)
CHLORIDE: 95 mmol/L — AB (ref 101–111)
CO2: 31 mmol/L (ref 22–32)
Calcium: 8.4 mg/dL — ABNORMAL LOW (ref 8.9–10.3)
Creatinine, Ser: 3.17 mg/dL — ABNORMAL HIGH (ref 0.44–1.00)
GFR calc Af Amer: 18 mL/min — ABNORMAL LOW (ref >60)
GFR calc non Af Amer: 16 mL/min — ABNORMAL LOW (ref >60)
Glucose, Bld: 106 mg/dL — ABNORMAL HIGH (ref 65–99)
Potassium: 4 mmol/L (ref 3.5–5.1)
Sodium: 136 mmol/L (ref 135–145)

## 2017-08-09 LAB — CBC
HEMATOCRIT: 22.9 % — AB (ref 36.0–46.0)
HEMOGLOBIN: 6.6 g/dL — AB (ref 12.0–15.0)
MCH: 29.3 pg (ref 26.0–34.0)
MCHC: 28.8 g/dL — ABNORMAL LOW (ref 30.0–36.0)
MCV: 101.8 fL — ABNORMAL HIGH (ref 78.0–100.0)
Platelets: 189 10*3/uL (ref 150–400)
RBC: 2.25 MIL/uL — AB (ref 3.87–5.11)
RDW: 18 % — ABNORMAL HIGH (ref 11.5–15.5)
WBC: 1.9 10*3/uL — ABNORMAL LOW (ref 4.0–10.5)

## 2017-08-09 LAB — GLUCOSE, CAPILLARY
GLUCOSE-CAPILLARY: 239 mg/dL — AB (ref 65–99)
Glucose-Capillary: 122 mg/dL — ABNORMAL HIGH (ref 65–99)
Glucose-Capillary: 138 mg/dL — ABNORMAL HIGH (ref 65–99)

## 2017-08-09 LAB — PHOSPHORUS: Phosphorus: 2.3 mg/dL — ABNORMAL LOW (ref 2.5–4.6)

## 2017-08-09 MED ORDER — ALTEPLASE 2 MG IJ SOLR: 2.0000 mg | Freq: Once | INTRAMUSCULAR | Status: DC | PRN

## 2017-08-09 MED ORDER — DOXERCALCIFEROL 4 MCG/2ML IV SOLN
INTRAVENOUS | Status: AC
  Filled 2017-08-09: qty 2

## 2017-08-09 NOTE — Procedures (Signed)
Lost 12 liters last two treatments. Still SOB.  Edema less. BP stable.  Will prob need treatment in AM. Hgb 6.6 but refuses PRBCs. Estanislado Emms

## 2017-08-09 NOTE — Progress Notes (Signed)
PROGRESS NOTE    [name]  JSH:[id number] DOB: Jan 06, 1966 DOA: 08/07/2017 PCP: [name], MD   Brief Narrative:  52 y.o. female with medical history significant of ESRD, acute on chronic heart failure presenting with her "Legs are starting not to work.  They used to go down at HD but now they are just like big fat legs that won't go down".  This AM, she woke up to get ready for HD and she couldn't stand up and eventually called 911.  She had to call 911 because she couldn't walk.  Her legs were too heavy.  +SOB  Assessment & Plan:   Principal Problem:   Volume overload Active Problems:   Chronic combined systolic (congestive) and diastolic (congestive) heart failure (HCC)   Essential hypertension   Anemia of chronic kidney failure   Chronic pain syndrome   Cocaine use disorder, severe, dependence (HCC)   End stage renal disease (HCC)   Diabetes mellitus with complication (HCC)  Volume overload in the setting of chronic CHF and ESRD - Nephrology assisting with HD - Volume management via HD plans, plan is for another HD session due to continued sob.  ESRD -Patient on chronic TTS HD -Nephrology on board and managing.  Chronic pain with cocaine use -For now will continue oxycodone prn without escalation.  DM -Will check A1c 5.9  -Continue basal-bolus insulin -Cover with moderate-scale SSI  Anemia -Patient is Jehovah's witness and will not accept blood products even at the risk of death -Hgb is currently stable  DVT prophylaxis: Heparin Code Status: Full Family Communication: none Disposition Plan: d/c once cleared by nephrology   Consultants:   Nephrology  Procedures: HD  Antimicrobials: None   Subjective: No new complaints reported.  Objective: Vitals:   08/09/17 1030 08/09/17 1052 08/09/17 1152 08/09/17 1654  BP: (!) 162/75 (!) 158/89 (!) 149/67 (!) 172/70  Pulse: 78 77 84 75  Resp: 17 18 18 18   Temp:  98 F (36.7 C) 98.6 F (37 C) 98.3  F (36.8 C)  TempSrc:  Oral Oral Oral  SpO2: 98% 99% 96% 100%  Weight:  59.7 kg (131 lb 9.8 oz)    Height:        Intake/Output Summary (Last 24 hours) at 08/09/2017 1725 Last data filed at 08/09/2017 1600 Gross per 24 hour  Intake 1020 ml  Output 8000 ml  Net -6980 ml   Filed Weights   08/08/17 2033 08/09/17 0700 08/09/17 1052  Weight: 65.2 kg (143 lb 11.8 oz) 62.7 kg (138 lb 3.7 oz) 59.7 kg (131 lb 9.8 oz)    Examination:  General exam: Appears calm and comfortable, in nad. Respiratory system: decreased breath sounds at bases, Respiratory effort normal. Equal chest rise. Cardiovascular system: S1 & S2 heard, RRR. No JVD, murmurs, rubs, gallops or clicks. Gastrointestinal system: Abdomen is nondistended, soft and nontender. No organomegaly or masses felt. Normal bowel sounds heard. Central nervous system: Alert and oriented. No focal neurological deficits. Extremities: Symmetric 5 x 5 power. Skin: No rashes, lesions or ulcers Psychiatry:  Mood & affect appropriate.   Data Reviewed: I have personally reviewed following labs and imaging studies  CBC: Recent Labs  Lab 08/07/17 0948 08/08/17 0045 08/09/17 0617  WBC 1.6* 1.9* 1.9*  NEUTROABS  --  1.2*  --   HGB 6.7* 7.1* 6.6*  HCT 22.9* 24.5* 22.9*  MCV 100.4* 100.0 101.8*  PLT 160 218 850   Basic Metabolic Panel: Recent Labs  Lab 08/07/17 0948 08/08/17  0045 08/09/17 0446 08/09/17 0742  NA 136 136 136  --   K 5.3* 4.1 4.0  --   CL 88* 91* 95*  --   CO2 36* 32 31  --   GLUCOSE 237* 300* 106*  --   BUN 38* 20 17  --   CREATININE 5.45* 3.44* 3.17*  --   CALCIUM 8.9 8.8* 8.4*  --   PHOS  --   --   --  2.3*   GFR: Estimated Creatinine Clearance: 18.1 mL/min (A) (by C-G formula based on SCr of 3.17 mg/dL (H)). Liver Function Tests: No results for input(s): AST, ALT, ALKPHOS, BILITOT, PROT, ALBUMIN in the last 168 hours. No results for input(s): LIPASE, AMYLASE in the last 168 hours. No results for input(s):  AMMONIA in the last 168 hours. Coagulation Profile: No results for input(s): INR, PROTIME in the last 168 hours. Cardiac Enzymes: No results for input(s): CKTOTAL, CKMB, CKMBINDEX, TROPONINI in the last 168 hours. BNP (last 3 results) No results for input(s): PROBNP in the last 8760 hours. HbA1C: Recent Labs    08/08/17 0045  HGBA1C 5.9*   CBG: Recent Labs  Lab 08/08/17 0049 08/08/17 0757 08/08/17 1156 08/08/17 2029 08/09/17 1153  GLUCAP 292* 210* 71 113* 122*   Lipid Profile: No results for input(s): CHOL, HDL, LDLCALC, TRIG, CHOLHDL, LDLDIRECT in the last 72 hours. Thyroid Function Tests: No results for input(s): TSH, T4TOTAL, FREET4, T3FREE, THYROIDAB in the last 72 hours. Anemia Panel: No results for input(s): VITAMINB12, FOLATE, FERRITIN, TIBC, IRON, RETICCTPCT in the last 72 hours. Sepsis Labs: No results for input(s): PROCALCITON, LATICACIDVEN in the last 168 hours.  No results found for this or any previous visit (from the past 240 hour(s)).    Radiology Studies: No results found.  Scheduled Meds: . allopurinol  100 mg Oral Q T,Th,Sat-1800  . aspirin EC  81 mg Oral Daily  . atorvastatin  40 mg Oral QHS  . carvedilol  25 mg Oral BID WC  . cloNIDine  0.1 mg Oral Q8H  . doxercalciferol  2 mcg Intravenous Q T,Th,Sa-HD  . feeding supplement (NEPRO CARB STEADY)  237 mL Oral TID BM  . ferrous sulfate  325 mg Oral Q breakfast  . heparin  5,000 Units Subcutaneous Q8H  . insulin aspart  0-15 Units Subcutaneous TID WC  . insulin aspart  0-5 Units Subcutaneous QHS  . insulin aspart  7 Units Subcutaneous TID WC  . insulin glargine  20 Units Subcutaneous QHS  . lisinopril  5 mg Oral Daily  . multivitamin  1 tablet Oral QHS  . pantoprazole  40 mg Oral Q0600  . sertraline  50 mg Oral Daily  . sevelamer carbonate  2,400 mg Oral TID WC  . sodium chloride flush  3 mL Intravenous Q12H  . traZODone  25 mg Oral QHS   Continuous Infusions: . sodium chloride    . ferric  gluconate (FERRLECIT/NULECIT) IV Stopped (08/09/17 1057)     LOS: 2 days    Time spent: > 35 minutes  Velvet Bathe, MD [facility] Pager [phone]  If 7PM-7AM, please contact night-coverage [email or web address] Password [email or web address] 08/09/2017, 5:25 PM

## 2017-08-09 NOTE — Progress Notes (Signed)
CRITICAL VALUE ALERT  Critical Value:  hgb 6.6  Date & Time Notied: 08/09/2017 @0820   Provider Notified: Larina Earthly PA  Orders Received/Actions taken: No orders received at this time. Pt refused blood transfusion d/t  Jehovah witness.

## 2017-08-09 NOTE — CV Procedure (Signed)
Attempted 2D Echo, patient is in hemodialysis per RN. Will try again at a later time.     [name]

## 2017-08-10 LAB — GLUCOSE, CAPILLARY
GLUCOSE-CAPILLARY: 92 mg/dL (ref 65–99)
GLUCOSE-CAPILLARY: 139 mg/dL — AB (ref 65–99)
Glucose-Capillary: 283 mg/dL — ABNORMAL HIGH (ref 65–99)
Glucose-Capillary: 148 mg/dL — ABNORMAL HIGH (ref 65–99)

## 2017-08-10 LAB — BASIC METABOLIC PANEL
Anion gap: 8 (ref 5–15)
BUN: 23 mg/dL — AB (ref 6–20)
CHLORIDE: 97 mmol/L — AB (ref 101–111)
CO2: 29 mmol/L (ref 22–32)
CREATININE: 3.27 mg/dL — AB (ref 0.44–1.00)
Calcium: 9 mg/dL (ref 8.9–10.3)
GFR calc Af Amer: 18 mL/min — ABNORMAL LOW (ref >60)
GFR calc non Af Amer: 15 mL/min — ABNORMAL LOW (ref >60)
Glucose, Bld: 316 mg/dL — ABNORMAL HIGH (ref 65–99)
Potassium: 4.1 mmol/L (ref 3.5–5.1)
SODIUM: 134 mmol/L — AB (ref 135–145)

## 2017-08-10 NOTE — Progress Notes (Signed)
PROGRESS NOTE    [name]  NGE:[id number] DOB: January 26, 1966 DOA: 08/07/2017 PCP: [name], MD   Brief Narrative:  52 y.o. female with medical history significant of ESRD, acute on chronic heart failure presenting with her "Legs are starting not to work.  They used to go down at HD but now they are just like big fat legs that won't go down".  This AM, she woke up to get ready for HD and she couldn't stand up and eventually called 911.  She had to call 911 because she couldn't walk.  Her legs were too heavy.  +SOB  Assessment & Plan:   Principal Problem:   Volume overload Active Problems:   Chronic combined systolic (congestive) and diastolic (congestive) heart failure (HCC)   Essential hypertension   Anemia of chronic kidney failure   Chronic pain syndrome   Cocaine use disorder, severe, dependence (HCC)   End stage renal disease (HCC)   Diabetes mellitus with complication (HCC)  Volume overload in the setting of chronic CHF and ESRD - Nephrology assisting with HD - Volume management via HD plans - Once more session of dialysis planned tomorrow. Plan for d/c next am.  ESRD -Patient on chronic TTS HD -Nephrology on board and managing.  Chronic pain with cocaine use -continue current medication regimen.  DM -Will check A1c 5.9  -Continue basal-bolus insulin -Cover with moderate-scale SSI  Anemia -Patient is Jehovah's witness and will not accept blood products even at the risk of death -Hgb is currently stable  DVT prophylaxis: Heparin Code Status: Full Family Communication: none Disposition Plan: d/c once cleared by nephrology   Consultants:   Nephrology  Procedures: HD  Antimicrobials: None  Subjective: Pt still complaining of some sob above baseline but reports improvement in condition.  Objective: Vitals:   08/09/17 2143 08/10/17 0105 08/10/17 0446 08/10/17 0800  BP: (!) 172/70 (!) 173/67 (!) 161/62 (!) 165/75  Pulse: 80 79 83 80  Resp: 16   15 16   Temp: 97.9 F (36.6 C)  98.4 F (36.9 C) 98.8 F (37.1 C)  TempSrc:    Oral  SpO2: 100%  96% 93%  Weight:      Height:        Intake/Output Summary (Last 24 hours) at 08/10/2017 1502 Last data filed at 08/10/2017 0900 Gross per 24 hour  Intake 1260 ml  Output 0 ml  Net 1260 ml   Filed Weights   08/08/17 2033 08/09/17 0700 08/09/17 1052  Weight: 65.2 kg (143 lb 11.8 oz) 62.7 kg (138 lb 3.7 oz) 59.7 kg (131 lb 9.8 oz)    Examination: Exam unchanged when compared to 08/09/2017  General exam: Appears calm and comfortable, in nad. Respiratory system: decreased breath sounds at bases, Respiratory effort normal. Equal chest rise. Cardiovascular system: S1 & S2 heard, RRR. No JVD, murmurs, rubs, gallops or clicks. Gastrointestinal system: Abdomen is nondistended, soft and nontender. No organomegaly or masses felt. Normal bowel sounds heard. Central nervous system: Alert and oriented. No focal neurological deficits. Extremities: Symmetric 5 x 5 power. Skin: No rashes, lesions or ulcers Psychiatry:  Mood & affect appropriate.   Data Reviewed: I have personally reviewed following labs and imaging studies  CBC: Recent Labs  Lab 08/07/17 0948 08/08/17 0045 08/09/17 0617  WBC 1.6* 1.9* 1.9*  NEUTROABS  --  1.2*  --   HGB 6.7* 7.1* 6.6*  HCT 22.9* 24.5* 22.9*  MCV 100.4* 100.0 101.8*  PLT 160 218 401   Basic Metabolic  Panel: Recent Labs  Lab 08/07/17 0948 08/08/17 0045 08/09/17 0446 08/09/17 0742 08/10/17 0406  NA 136 136 136  --  134*  K 5.3* 4.1 4.0  --  4.1  CL 88* 91* 95*  --  97*  CO2 36* 32 31  --  29  GLUCOSE 237* 300* 106*  --  316*  BUN 38* 20 17  --  23*  CREATININE 5.45* 3.44* 3.17*  --  3.27*  CALCIUM 8.9 8.8* 8.4*  --  9.0  PHOS  --   --   --  2.3*  --    GFR: Estimated Creatinine Clearance: 17.6 mL/min (A) (by C-G formula based on SCr of 3.27 mg/dL (H)). Liver Function Tests: No results for input(s): AST, ALT, ALKPHOS, BILITOT, PROT, ALBUMIN in  the last 168 hours. No results for input(s): LIPASE, AMYLASE in the last 168 hours. No results for input(s): AMMONIA in the last 168 hours. Coagulation Profile: No results for input(s): INR, PROTIME in the last 168 hours. Cardiac Enzymes: No results for input(s): CKTOTAL, CKMB, CKMBINDEX, TROPONINI in the last 168 hours. BNP (last 3 results) No results for input(s): PROBNP in the last 8760 hours. HbA1C: Recent Labs    08/08/17 0045  HGBA1C 5.9*   CBG: Recent Labs  Lab 08/09/17 1153 08/09/17 1659 08/09/17 2142 08/10/17 0747 08/10/17 1207  GLUCAP 122* 239* 138* 283* 148*   Lipid Profile: No results for input(s): CHOL, HDL, LDLCALC, TRIG, CHOLHDL, LDLDIRECT in the last 72 hours. Thyroid Function Tests: No results for input(s): TSH, T4TOTAL, FREET4, T3FREE, THYROIDAB in the last 72 hours. Anemia Panel: No results for input(s): VITAMINB12, FOLATE, FERRITIN, TIBC, IRON, RETICCTPCT in the last 72 hours. Sepsis Labs: No results for input(s): PROCALCITON, LATICACIDVEN in the last 168 hours.  No results found for this or any previous visit (from the past 240 hour(s)).    Radiology Studies: No results found.  Scheduled Meds: . allopurinol  100 mg Oral Q T,Th,Sat-1800  . aspirin EC  81 mg Oral Daily  . atorvastatin  40 mg Oral QHS  . carvedilol  25 mg Oral BID WC  . cloNIDine  0.1 mg Oral Q8H  . doxercalciferol  2 mcg Intravenous Q T,Th,Sa-HD  . feeding supplement (NEPRO CARB STEADY)  237 mL Oral TID BM  . ferrous sulfate  325 mg Oral Q breakfast  . heparin  5,000 Units Subcutaneous Q8H  . insulin aspart  0-15 Units Subcutaneous TID WC  . insulin aspart  0-5 Units Subcutaneous QHS  . insulin aspart  7 Units Subcutaneous TID WC  . insulin glargine  20 Units Subcutaneous QHS  . lisinopril  5 mg Oral Daily  . multivitamin  1 tablet Oral QHS  . pantoprazole  40 mg Oral Q0600  . sertraline  50 mg Oral Daily  . sevelamer carbonate  2,400 mg Oral TID WC  . sodium chloride  flush  3 mL Intravenous Q12H  . traZODone  25 mg Oral QHS   Continuous Infusions: . sodium chloride    . ferric gluconate (FERRLECIT/NULECIT) IV Stopped (08/09/17 1057)     LOS: 3 days    Time spent: > 35 minutes  Velvet Bathe, MD [facility] Pager [phone]  If 7PM-7AM, please contact night-coverage [email or web address] Password [email or web address] 08/10/2017, 3:02 PM

## 2017-08-10 NOTE — Progress Notes (Signed)
[facility] Progress Note   Subjective:  Seen in room, sitting up in bed. Breathing improving, wearing nasal oxygen this morning, but says was up walking yesterday without it. Will try again today. HD yesterday net UF 3L    Objective Vitals:   08/09/17 2143 08/10/17 0105 08/10/17 0446 08/10/17 0800  BP: (!) 172/70 (!) 173/67 (!) 161/62 (!) 165/75  Pulse: 80 79 83 80  Resp: 16  15 16   Temp: 97.9 F (36.6 C)  98.4 F (36.9 C) 98.8 F (37.1 C)  TempSrc:    Oral  SpO2: 100%  96% 93%  Weight:      Height:       Physical Exam General: Well developed female, facial edema improving Heart: RRR; 2/6 systolic murmur Lungs: CTAB Abdomen: soft, non-tender Extremities: trace LE edema Dialysis Access: L forearm loop AVG + bruit  Additional Objective Labs: Basic Metabolic Panel: Recent Labs  Lab 08/08/17 0045 08/09/17 0446 08/09/17 0742 08/10/17 0406  NA 136 136  --  134*  K 4.1 4.0  --  4.1  CL 91* 95*  --  97*  CO2 32 31  --  29  GLUCOSE 300* 106*  --  316*  BUN 20 17  --  23*  CREATININE 3.44* 3.17*  --  3.27*  CALCIUM 8.8* 8.4*  --  9.0  PHOS  --   --  2.3*  --    CBC: Recent Labs  Lab 08/07/17 0948 08/08/17 0045 08/09/17 0617  WBC 1.6* 1.9* 1.9*  NEUTROABS  --  1.2*  --   HGB 6.7* 7.1* 6.6*  HCT 22.9* 24.5* 22.9*  MCV 100.4* 100.0 101.8*  PLT 160 218 189   Studies/Results: No results found. Medications: . sodium chloride    . ferric gluconate (FERRLECIT/NULECIT) IV Stopped (08/09/17 1057)   . allopurinol  100 mg Oral Q T,Th,Sat-1800  . aspirin EC  81 mg Oral Daily  . atorvastatin  40 mg Oral QHS  . carvedilol  25 mg Oral BID WC  . cloNIDine  0.1 mg Oral Q8H  . doxercalciferol  2 mcg Intravenous Q T,Th,Sa-HD  . feeding supplement (NEPRO CARB STEADY)  237 mL Oral TID BM  . ferrous sulfate  325 mg Oral Q breakfast  . heparin  5,000 Units Subcutaneous Q8H  . insulin aspart  0-15 Units Subcutaneous TID WC  . insulin aspart  0-5 Units  Subcutaneous QHS  . insulin aspart  7 Units Subcutaneous TID WC  . insulin glargine  20 Units Subcutaneous QHS  . lisinopril  5 mg Oral Daily  . multivitamin  1 tablet Oral QHS  . pantoprazole  40 mg Oral Q0600  . sertraline  50 mg Oral Daily  . sevelamer carbonate  2,400 mg Oral TID WC  . sodium chloride flush  3 mL Intravenous Q12H  . traZODone  25 mg Oral QHS    Dialysis Orders: TTS at [facility] 3:30hr, 400/800, EDW 63kg, 2K/2.25Ca bath, AVG, no heparin - Mircera 266mcg IV q 2 weeks (last 12/31) - Venofer 100mg  x 5 ordered (2 given so far) - Hectoral 8mcg IV q HD  Assessment/Plan: 1.  Acute Respiratory Failure/Volume overload: Improving with HD. Down 15L with serial HD this week  2.  ESRD: Usually TTS schedule. HD tomorrow for further volume removal  3.  Hypertension/volume: BP improving with volume removal. Follow./Now below outpatient EDW. Lower at discharge  4.  Anemia (Jehovah's Witness/refuses blood products): Hgb 7.1>6.6  (stable for her). ESA just  given earlier this week. Will continue IV course with HD. 5.  Metabolic bone disease: Ca ok/Phos ok Resume binders (Renvela/Phoslo)/VDRA. 6.  Leukopenia: Unclear cause? Follow. 7.  L foot ulceration: Does not appear infected, wound care per primary. 8.  Type 2 DM: Per primary 9. Dispo Pt requesting going to SNF after this admission, needs help with ADLs.  SW/CM involved     Lynnda Child PA-C [facility] Pager [phone] 08/10/2017,10:48 AM   Renal Attending: Better with fluid off. I agree with plans as articulated above. Estanislado Emms

## 2017-08-10 NOTE — Progress Notes (Signed)
Patient is requesting to have an enema. States she is constipated and feels like she has a blockage. Tried taking senna and drinking prune juice and nothing is effective. RN notified on call NP, Bodenheimer.  Awaiting orders.  Ermalinda Memos, RN

## 2017-08-11 ENCOUNTER — Inpatient Hospital Stay (HOSPITAL_COMMUNITY): Payer: Medicare Other

## 2017-08-11 LAB — CBC
HCT: 25.3 % — ABNORMAL LOW (ref 36.0–46.0)
Hemoglobin: 7.6 g/dL — ABNORMAL LOW (ref 12.0–15.0)
MCH: 30.4 pg (ref 26.0–34.0)
MCHC: 30 g/dL (ref 30.0–36.0)
MCV: 101.2 fL — AB (ref 78.0–100.0)
PLATELETS: 223 10*3/uL (ref 150–400)
RBC: 2.5 MIL/uL — AB (ref 3.87–5.11)
RDW: 18.6 % — ABNORMAL HIGH (ref 11.5–15.5)
WBC: 2.8 10*3/uL — ABNORMAL LOW (ref 4.0–10.5)

## 2017-08-11 LAB — GLUCOSE, CAPILLARY: Glucose-Capillary: 182 mg/dL — ABNORMAL HIGH (ref 65–99)

## 2017-08-11 NOTE — Clinical Social Work Note (Addendum)
Patient medically stable to discharge to [facility] today.  Discharge clinicals transmitted to facility and son [name] contacted by patient (while CSW in room) and informed of discharge. Son completed patient's admission paperwork at [facility]. [name] will be transported to facility by her son [name]. CSW signing off as no other SW intervention services needed at this time.  [name], MSW, LCSW Licensed Clinical Social Worker [place] [phone]

## 2017-08-11 NOTE — Progress Notes (Signed)
Patient refused an Echocardiogram today. She stated she was going home and no need "for her insurance to get charged more."

## 2017-08-11 NOTE — Care Management Important Message (Signed)
Important Message  Patient Details  Name: [name] MRN: [id number] Date of Birth: 04-Jun-1966   Medicare Important Message Given:  Yes    [name] 08/11/2017, 1:51 PM

## 2017-08-11 NOTE — Progress Notes (Signed)
[facility] Progress Note   Subjective:  Seen on HD, another 5L UF planned. Denies CP or dyspnea at this time, leg weakness/pain much improved. She is asking about going to SNF after d/c. Advised to disc with her hospitalist.  Objective Vitals:   08/11/17 1000 08/11/17 1030 08/11/17 1056 08/11/17 1151  BP: 117/65 (!) 141/78 (!) 143/75 (!) 145/71  Pulse: 71 71 72   Resp:   (!) 21   Temp:   97.7 F (36.5 C)   TempSrc:   Oral   SpO2:      Weight:   61.6 kg (135 lb 12.9 oz)   Height:       Physical Exam General: Well developed female, facial edema improving Heart: RRR; 2/6 systolic murmur Lungs: CTAB Abdomen: soft, non-tender Extremities: trace LE edema Dialysis Access: L forearm loop AVG + bruit    Additional Objective Labs: Basic Metabolic Panel: Recent Labs  Lab 08/08/17 0045 08/09/17 0446 08/09/17 0742 08/10/17 0406  NA 136 136  --  134*  K 4.1 4.0  --  4.1  CL 91* 95*  --  97*  CO2 32 31  --  29  GLUCOSE 300* 106*  --  316*  BUN 20 17  --  23*  CREATININE 3.44* 3.17*  --  3.27*  CALCIUM 8.8* 8.4*  --  9.0  PHOS  --   --  2.3*  --    CBC: Recent Labs  Lab 08/07/17 0948 08/08/17 0045 08/09/17 0617 08/11/17 0817  WBC 1.6* 1.9* 1.9* 2.8*  NEUTROABS  --  1.2*  --   --   HGB 6.7* 7.1* 6.6* 7.6*  HCT 22.9* 24.5* 22.9* 25.3*  MCV 100.4* 100.0 101.8* 101.2*  PLT 160 218 189 223   Medications: . sodium chloride    . ferric gluconate (FERRLECIT/NULECIT) IV Stopped (08/09/17 1057)   . allopurinol  100 mg Oral Q T,Th,Sat-1800  . aspirin EC  81 mg Oral Daily  . atorvastatin  40 mg Oral QHS  . carvedilol  25 mg Oral BID WC  . cloNIDine  0.1 mg Oral Q8H  . doxercalciferol  2 mcg Intravenous Q T,Th,Sa-HD  . feeding supplement (NEPRO CARB STEADY)  237 mL Oral TID BM  . ferrous sulfate  325 mg Oral Q breakfast  . heparin  5,000 Units Subcutaneous Q8H  . insulin aspart  0-15 Units Subcutaneous TID WC  . insulin aspart  0-5 Units Subcutaneous  QHS  . insulin aspart  7 Units Subcutaneous TID WC  . insulin glargine  20 Units Subcutaneous QHS  . lisinopril  5 mg Oral Daily  . multivitamin  1 tablet Oral QHS  . pantoprazole  40 mg Oral Q0600  . sertraline  50 mg Oral Daily  . sevelamer carbonate  2,400 mg Oral TID WC  . sodium chloride flush  3 mL Intravenous Q12H  . traZODone  25 mg Oral QHS    Dialysis Orders: TTS at [facility] 3:30hr, 400/800, EDW 63kg, 2K/2.25Ca bath, AVG, no heparin - Mircera 216mcg IV q 2 weeks (last 12/31) - Venofer 100mg  x 5 ordered (2 given so far) - Hectoral 69mcg IV q HD  Assessment/Plan: 1. Acute Respiratory Failure/Volume overload:Improving with HD. Down 15L with serial HD this week  2. ESRD:Usually TTS schedule, extra HD today. 3. Hypertension/volume:BP improving with volume removal. Now below outpatient EDW, will lower at d/c. 4. Anemia(Jehovah's Witness/refuses blood products): Hgb 7.6 (stable for her). ESA just given earlier this week. Will continue  IV course with HD. 5. Metabolic bone disease:Ca ok/Phos ok Resume binders (Renvela/Phoslo)/VDRA. 6. Leukopenia: Unclear cause? Follow, improving. 7. L foot ulceration: Does not appear infected, wound care per primary. 8. Type 2 DM: Per primary 9. Dispo: Pt requesting going to SNF after this admission, needs help with ADLs. SW/CM involved. Ok to discharge from renal standpoint at this time.    Veneta Penton, PA-C 08/11/2017, 11:53 AM  [facility] Pager: [phone]  Pt seen, examined and agree w A/P as above.  Kelly Splinter MD [employer] pager [phone]   08/11/2017, 2:50 PM

## 2017-08-11 NOTE — Plan of Care (Deleted)
Patient IV potassium x4 completed.

## 2017-08-11 NOTE — Progress Notes (Signed)
[name] to be D/C'd Skilled nursing facility per MD order.  Discussed prescriptions and follow up appointments with the patient. Prescriptions given to patient, medication list explained in detail. Pt verbalized understanding.  Allergies as of 08/11/2017      Reactions   Penicillins Hives, Other (See Comments)   Has patient had a PCN reaction causing immediate rash, facial/tongue/throat swelling, SOB or lightheadedness with hypotension: Yes Has patient had a PCN reaction causing severe rash involving mucus membranes or skin necrosis: No Has patient had a PCN reaction that required hospitalization No Has patient had a PCN reaction occurring within the last 10 years: No If all of the above answers are "NO", then may proceed with Cephalosporin use. Pt tolerates cefepime and ceftriaxone   Gabapentin Itching, Swelling   Lyrica [pregabalin] Swelling, Other (See Comments)   Facial and leg swelling   Saphris [asenapine] Swelling, Other (See Comments)   Facial swelling   Tramadol Swelling, Other (See Comments)   Leg swelling   Other Other (See Comments)   NO BLOOD PRODUCTS. PT IS A JEHOVAH WITNESS      Medication List    TAKE these medications   albuterol 108 (90 Base) MCG/ACT inhaler Commonly known as:  PROVENTIL HFA;VENTOLIN HFA Inhale 2 puffs into the lungs every 4 (four) hours as needed for wheezing or shortness of breath.   allopurinol 100 MG tablet Commonly known as:  ZYLOPRIM Take 1 tablet (100 mg total) by mouth every Tuesday, Thursday, and Saturday at 6 PM.   amLODipine 10 MG tablet Commonly known as:  NORVASC Take 1 tablet (10 mg total) by mouth at bedtime.   atorvastatin 40 MG tablet Commonly known as:  LIPITOR Take 1 tablet (40 mg total) by mouth at bedtime.   calcium acetate 667 MG capsule Commonly known as:  PHOSLO Take 2 capsules (1,334 mg total) by mouth 3 (three) times daily with meals. What changed:  how much to take   carvedilol 25 MG tablet Commonly known  as:  COREG Take 1 tablet (25 mg total) by mouth 2 (two) times daily with a meal.   cloNIDine 0.1 MG tablet Commonly known as:  CATAPRES Take 1 tablet (0.1 mg total) by mouth every 8 (eight) hours.   diphenhydrAMINE 25 mg capsule Commonly known as:  BENADRYL Take 25 mg by mouth every 6 (six) hours as needed for itching. Patient is given one capsule on dialysis days   feeding supplement (NEPRO CARB STEADY) Liqd Take 237 mLs by mouth 3 (three) times daily between meals.   Ferrous Sulfate 142 (45 Fe) MG Tbcr Commonly known as:  SLOW FE Take 1 tablet by mouth daily.   hydrocortisone 2.5 % rectal cream Commonly known as:  ANUSOL-HC Place rectally 2 (two) times daily. What changed:    how much to take  when to take this  reasons to take this   insulin aspart 100 UNIT/ML injection Commonly known as:  novoLOG Inject 7 Units into the skin 3 (three) times daily with meals.   insulin glargine 100 UNIT/ML injection Commonly known as:  LANTUS Inject 0.2 mLs (20 Units total) into the skin at bedtime. What changed:  how much to take   LORazepam 0.5 MG tablet Commonly known as:  ATIVAN Take 1 tablet (0.5 mg total) by mouth every 8 (eight) hours as needed for anxiety.   multivitamin Tabs tablet Take 1 tablet by mouth at bedtime.   mupirocin ointment 2 % Commonly known as:  BACTROBAN Apply 1  application topically daily.   Oxycodone HCl 10 MG Tabs Take 1 tablet (10 mg total) by mouth every 6 (six) hours as needed.   pantoprazole 40 MG tablet Commonly known as:  PROTONIX Take 1 tablet (40 mg total) by mouth daily at 6 (six) AM.   polyethylene glycol packet Commonly known as:  MIRALAX / GLYCOLAX Take 17 g by mouth 3 (three) times daily.   sertraline 50 MG tablet Commonly known as:  ZOLOFT Take 1 tablet (50 mg total) by mouth daily.   traZODone 50 MG tablet Commonly known as:  DESYREL Take 0.5 tablets (25 mg total) by mouth at bedtime.   vitamin C 500 MG tablet Commonly  known as:  ASCORBIC ACID Take 500 mg by mouth daily.       Vitals:   08/11/17 1151 08/11/17 1153  BP: (!) 145/71 (!) 145/71  Pulse:  72  Resp:  18  Temp:  98 F (36.7 C)  SpO2:  100%    Skin clean, dry and intact without evidence of skin break down, no evidence of skin tears noted. IV catheter discontinued intact. Site without signs and symptoms of complications. Dressing and pressure applied. Pt denies pain at this time. No complaints noted.  An After Visit Summary was printed and given to the patient. Patient escorted via [place], and D/C home via private auto.  Chuck Hint RN [facility] [employer]

## 2017-08-11 NOTE — Progress Notes (Signed)
Patient transported by bed with cereal to Dialysis

## 2017-08-11 NOTE — Discharge Summary (Signed)
Physician Discharge Summary  [name] RWE:[id number] DOB: [birth year] DOA: 08/07/2017  PCP: [name], MD  Admit date: 08/07/2017 Discharge date: 08/11/2017  Time spent: > 35 minutes  Recommendations for Outpatient Follow-up:  1. TTS schedule Encourage compliance with hemodialysis schedule   Discharge Diagnoses:  Principal Problem:   Volume overload Active Problems:   Chronic combined systolic (congestive) and diastolic (congestive) heart failure (HCC)   Essential hypertension   Anemia of chronic kidney failure   Chronic pain syndrome   Cocaine use disorder, severe, dependence (HCC)   End stage renal disease (HCC)   Diabetes mellitus with complication [facility])   Discharge Condition: stable  Diet recommendation: Heart healthy  Filed Weights   08/10/17 2149 08/11/17 0717 08/11/17 1056  Weight: 64.2 kg (141 lb 9.6 oz) 66.3 kg (146 lb 2.6 oz) 61.6 kg (135 lb 12.9 oz)    History of present illness:  52 y.o.femalewith medical history significant ofESRD, acute on chronic heart failure presenting withher "Legs are starting not to work. They used to go down at HD but now they are just like big fat legs that won't go down". This AM, she woke up to get ready for HD and she couldn't stand up and eventually called 911. She had to call 911 because she couldn't walk. Her legs were too heavy.   Hospital Course:   Volume overload in the setting of chronic CHF and ESRD - Nephrology assisting with HD - Volume management via HD  2. - resolved after several dialysis sessions (pulled fluid every dialysis session) Per Nephro: Now below outpatient EDW, will lower at d/c.   ESRD -Patient on chronic TTS HD -Nephrology on board and managed while in house.   Chronic pain  -continue prior to admission regimen  DM -continue diabetic diet and prior to admission medication regimen.  Anemia -Patient is Jehovah's witness and will not accept blood products even at the risk of  death -Hgb is currently stable  1. Hgb7.6(stable for her). ESA just given earlier this week. Will continue IV course with HD.   Procedures:  HD  Consultations:  Nephrology  Discharge Exam: Vitals:   08/11/17 1151 08/11/17 1153  BP: (!) 145/71 (!) 145/71  Pulse:  72  Resp:  18  Temp:  98 F (36.7 C)  SpO2:  100%    General: Pt in nad, alert and awake Cardiovascular: rrr, no rubs Respiratory: no increased wob, no wheezes  Discharge Instructions   Discharge Instructions    Diet - low sodium heart healthy   Complete by:  As directed    Discharge instructions   Complete by:  As directed    Please follow up with your nephrologist and continue your prior to admission dialysis regimen.   Increase activity slowly   Complete by:  As directed      Allergies as of 08/11/2017      Reactions   Penicillins Hives, Other (See Comments)   Has patient had a PCN reaction causing immediate rash, facial/tongue/throat swelling, SOB or lightheadedness with hypotension: Yes Has patient had a PCN reaction causing severe rash involving mucus membranes or skin necrosis: No Has patient had a PCN reaction that required hospitalization No Has patient had a PCN reaction occurring within the last 10 years: No If all of the above answers are "NO", then may proceed with Cephalosporin use. Pt tolerates cefepime and ceftriaxone   Gabapentin Itching, Swelling   Lyrica [pregabalin] Swelling, Other (See Comments)   Facial and leg swelling  Saphris [asenapine] Swelling, Other (See Comments)   Facial swelling   Tramadol Swelling, Other (See Comments)   Leg swelling   Other Other (See Comments)   NO BLOOD PRODUCTS. PT IS A JEHOVAH WITNESS      Medication List    TAKE these medications   albuterol 108 (90 Base) MCG/ACT inhaler Commonly known as:  PROVENTIL HFA;VENTOLIN HFA Inhale 2 puffs into the lungs every 4 (four) hours as needed for wheezing or shortness of breath.   allopurinol 100 MG  tablet Commonly known as:  ZYLOPRIM Take 1 tablet (100 mg total) by mouth every Tuesday, Thursday, and Saturday at 6 PM.   amLODipine 10 MG tablet Commonly known as:  NORVASC Take 1 tablet (10 mg total) by mouth at bedtime.   atorvastatin 40 MG tablet Commonly known as:  LIPITOR Take 1 tablet (40 mg total) by mouth at bedtime.   calcium acetate 667 MG capsule Commonly known as:  PHOSLO Take 2 capsules (1,334 mg total) by mouth 3 (three) times daily with meals. What changed:  how much to take   carvedilol 25 MG tablet Commonly known as:  COREG Take 1 tablet (25 mg total) by mouth 2 (two) times daily with a meal.   cloNIDine 0.1 MG tablet Commonly known as:  CATAPRES Take 1 tablet (0.1 mg total) by mouth every 8 (eight) hours.   diphenhydrAMINE 25 mg capsule Commonly known as:  BENADRYL Take 25 mg by mouth every 6 (six) hours as needed for itching. Patient is given one capsule on dialysis days   feeding supplement (NEPRO CARB STEADY) Liqd Take 237 mLs by mouth 3 (three) times daily between meals.   Ferrous Sulfate 142 (45 Fe) MG Tbcr Commonly known as:  SLOW FE Take 1 tablet by mouth daily.   hydrocortisone 2.5 % rectal cream Commonly known as:  ANUSOL-HC Place rectally 2 (two) times daily. What changed:    how much to take  when to take this  reasons to take this   insulin aspart 100 UNIT/ML injection Commonly known as:  novoLOG Inject 7 Units into the skin 3 (three) times daily with meals.   insulin glargine 100 UNIT/ML injection Commonly known as:  LANTUS Inject 0.2 mLs (20 Units total) into the skin at bedtime. What changed:  how much to take   LORazepam 0.5 MG tablet Commonly known as:  ATIVAN Take 1 tablet (0.5 mg total) by mouth every 8 (eight) hours as needed for anxiety.   multivitamin Tabs tablet Take 1 tablet by mouth at bedtime.   mupirocin ointment 2 % Commonly known as:  BACTROBAN Apply 1 application topically daily.   Oxycodone HCl 10 MG  Tabs Take 1 tablet (10 mg total) by mouth every 6 (six) hours as needed.   pantoprazole 40 MG tablet Commonly known as:  PROTONIX Take 1 tablet (40 mg total) by mouth daily at 6 (six) AM.   polyethylene glycol packet Commonly known as:  MIRALAX / GLYCOLAX Take 17 g by mouth 3 (three) times daily.   sertraline 50 MG tablet Commonly known as:  ZOLOFT Take 1 tablet (50 mg total) by mouth daily.   traZODone 50 MG tablet Commonly known as:  DESYREL Take 0.5 tablets (25 mg total) by mouth at bedtime.   vitamin C 500 MG tablet Commonly known as:  ASCORBIC ACID Take 500 mg by mouth daily.      Allergies  Allergen Reactions  . Penicillins Hives and Other (See Comments)  Has patient had a PCN reaction causing immediate rash, facial/tongue/throat swelling, SOB or lightheadedness with hypotension: Yes Has patient had a PCN reaction causing severe rash involving mucus membranes or skin necrosis: No Has patient had a PCN reaction that required hospitalization No Has patient had a PCN reaction occurring within the last 10 years: No If all of the above answers are "NO", then may proceed with Cephalosporin use. Pt tolerates cefepime and ceftriaxone  . Gabapentin Itching and Swelling  . Lyrica [Pregabalin] Swelling and Other (See Comments)    Facial and leg swelling  . Saphris [Asenapine] Swelling and Other (See Comments)    Facial swelling  . Tramadol Swelling and Other (See Comments)    Leg swelling  . Other Other (See Comments)    NO BLOOD PRODUCTS. PT IS A JEHOVAH WITNESS      The results of significant diagnostics from this hospitalization (including imaging, microbiology, ancillary and laboratory) are listed below for reference.    Significant Diagnostic Studies: Dg Chest 2 View  Result Date: 08/07/2017 CLINICAL DATA:  Shortness of breath, cough for 5 days EXAM: CHEST  2 VIEW COMPARISON:  07/16/2017 FINDINGS: Cardiomegaly with vascular congestion. Interstitial prominence  likely reflects mild interstitial edema. No significant effusions. No acute bony abnormality. IMPRESSION: Cardiomegaly with vascular congestion and suspected mild interstitial edema. Electronically Signed   By: Rolm Baptise M.D.   On: 08/07/2017 11:01   Dg Chest Port 1 View  Result Date: 07/16/2017 CLINICAL DATA:  Weakness and GI bleeding EXAM: PORTABLE CHEST 1 VIEW COMPARISON:  06/03/2017 FINDINGS: Cardiac shadow is enlarged but stable. Increased vascular congestion is noted. Bibasilar infiltrates are seen with associated effusions. No bony abnormality is noted. IMPRESSION: Increased vascular congestion. Bibasilar infiltrates left greater than right. Electronically Signed   By: Inez Catalina M.D.   On: 07/16/2017 15:08    Microbiology: No results found for this or any previous visit (from the past 240 hour(s)).   Labs: Basic Metabolic Panel: Recent Labs  Lab 08/07/17 0948 08/08/17 0045 08/09/17 0446 08/09/17 0742 08/10/17 0406  NA 136 136 136  --  134*  K 5.3* 4.1 4.0  --  4.1  CL 88* 91* 95*  --  97*  CO2 36* 32 31  --  29  GLUCOSE 237* 300* 106*  --  316*  BUN 38* 20 17  --  23*  CREATININE 5.45* 3.44* 3.17*  --  3.27*  CALCIUM 8.9 8.8* 8.4*  --  9.0  PHOS  --   --   --  2.3*  --    Liver Function Tests: No results for input(s): AST, ALT, ALKPHOS, BILITOT, PROT, ALBUMIN in the last 168 hours. No results for input(s): LIPASE, AMYLASE in the last 168 hours. No results for input(s): AMMONIA in the last 168 hours. CBC: Recent Labs  Lab 08/07/17 0948 08/08/17 0045 08/09/17 0617 08/11/17 0817  WBC 1.6* 1.9* 1.9* 2.8*  NEUTROABS  --  1.2*  --   --   HGB 6.7* 7.1* 6.6* 7.6*  HCT 22.9* 24.5* 22.9* 25.3*  MCV 100.4* 100.0 101.8* 101.2*  PLT 160 218 189 223   Cardiac Enzymes: No results for input(s): CKTOTAL, CKMB, CKMBINDEX, TROPONINI in the last 168 hours. BNP: BNP (last 3 results) Recent Labs    12/31/16 2350 06/03/17 1213 08/07/17 1029  BNP 4,207.8* 2,212.3*  >4,500.0*    ProBNP (last 3 results) No results for input(s): PROBNP in the last 8760 hours.  CBG: Recent Labs  Lab 08/10/17 0747  08/10/17 1207 08/10/17 1703 08/10/17 2147 08/11/17 1151  GLUCAP 283* 148* 92 139* 182*     Signed:  Velvet Bathe MD.  [facility] 08/11/2017, 2:18 PM

## 2017-08-11 NOTE — Progress Notes (Signed)
  Echocardiogram 2D Echocardiogram has attempted. Patient Refused twice. First Patient was in restroom. When returned, patient stated she was being discharged and did not want to complete exam.  [name] G [name] 08/11/2017, 3:04 PM

## 2017-08-29 ENCOUNTER — Other Ambulatory Visit: Payer: Self-pay | Admitting: *Deleted

## 2017-08-29 NOTE — Patient Outreach (Signed)
[facility]) Care Management  08/29/2017  LAIYLA SLAGEL 1965/10/20 [id number]   Met with Peggy SW at facility regarding patient.  Patient has had several inpatient and ED visits over the past year and has a history of HF.   Peggy reports patient will discharge from Skilled 1/30. She will use her Medicaid to remain LTC.   No plans to discharge home for any [facility] community follow up. Plan to sign off. Royetta Crochet. Laymond Purser, RN, BSN, [place] [phone]) Business Cell  [phone]) Toll Free Office

## 2017-09-04 ENCOUNTER — Telehealth (INDEPENDENT_AMBULATORY_CARE_PROVIDER_SITE_OTHER): Payer: Self-pay | Admitting: Radiology

## 2017-09-04 NOTE — Telephone Encounter (Signed)
Santiago Glad RN from [facility] calling wanting to get patient in with Dr. Sharol Given. Patient long time patient of Dr. Sharol Given, with 3-4 + pitting edema, hx transmet amp. The facility cannot bring patient in today. Offered appt tomorrow with Junie Panning at 230pm.

## 2017-09-05 ENCOUNTER — Ambulatory Visit (INDEPENDENT_AMBULATORY_CARE_PROVIDER_SITE_OTHER): Payer: Medicare Other | Admitting: Physician Assistant

## 2017-09-05 ENCOUNTER — Encounter (INDEPENDENT_AMBULATORY_CARE_PROVIDER_SITE_OTHER): Payer: Self-pay | Admitting: Physician Assistant

## 2017-09-05 VITALS — Ht 64.0 in | Wt 135.0 lb

## 2017-09-05 DIAGNOSIS — E8770 Fluid overload, unspecified: Secondary | ICD-10-CM | POA: Insufficient documentation

## 2017-09-05 DIAGNOSIS — R600 Localized edema: Secondary | ICD-10-CM | POA: Diagnosis not present

## 2017-09-05 NOTE — Progress Notes (Signed)
Office Visit Note   Patient: [name]           Date of Birth: [birth year]           MRN: [id number] Visit Date: 09/05/2017              Requested by: [name] Panda, MD [id number], [place] [address] PCP: [name] Panda, MD   Assessment & Plan: Visit Diagnoses:  1. Localized edema due to fluid overload     Plan: Impression is left leg pain from fluid retention.  [name] is going to continue to monitor this.  Elevate the left lower extremity.  She can use moist heat to the left thigh.  She will follow-up with Dr. due to in 2 weeks time.  Follow-Up Instructions: Return in about 2 weeks (around 09/19/2017) for with [name].   Orders:  No orders of the defined types were placed in this encounter.  No orders of the defined types were placed in this encounter.     Procedures: No procedures performed   Clinical Data: No additional findings.   Subjective: Chief Complaint  Patient presents with  . Left Foot - Pain    Hx left transmet amputation   . Left Leg - Pain    HPI [name] is a pleasant 52 year old female who presents to our clinic today with left thigh pain and swelling.  This began on 08/07/2017.  She was seen in the ED for this secondary to fluid overload.  The pain she has is to the entire left thigh.  Pain is no worse with movement.  This seems to progressively worsened and her leg feels very heavy.   Of note she is a diabetic with chronic kidney disease who is dialyzed 2-3 times per week.  She is also status post transmetatarsal amputation on the left by Dr. due to several years ago.  Review of Systems as detailed in HPI.  All others are negative.   Objective: Vital Signs: Ht 5\' 4"  (1.626 m)   Wt 135 lb (61.2 kg)   LMP  (LMP Unknown)   BMI 23.17 kg/m   Physical Exam well-developed well-nourished female no acute distress.  Alert and oriented x3.  Ortho Exam examination of her left lower extremity reveals moderate swelling to the left.  Left calf is  soft and nontender.  Full range of motion of the left knee.  Specialty Comments:  No specialty comments available.  Imaging: None today   PMFS History: Patient Active Problem List   Diagnosis Date Noted  . Localized edema due to fluid overload 09/05/2017  . Lower GI bleed   . Evaluation by psychiatric service required 07/17/2017  . Volume overload 06/03/2017  . Right supracondylar humerus fracture, with nonunion, subsequent encounter 05/01/2017  . Malnutrition of moderate degree 02/28/2017  . Leg pain, left 02/25/2017  . Left leg pain 02/25/2017  . External hemorrhoids   . Diverticulosis of colon without hemorrhage   . Goals of care, counseling/discussion   . Palliative care encounter   . Cellulitis of left leg 01/16/2017  . Diabetes mellitus with complication ([place]) 62/26/3335  . Upper GI bleed 01/16/2017  . UGIB (upper gastrointestinal bleed)   . Anemia due to chronic kidney disease   . Cellulitis of left thigh 01/01/2017  . End stage renal disease ([place]) 01/01/2017  . Cocaine use disorder, severe, dependence ([place]) 08/29/2016  . Hepatitis C 08/29/2016  . Diabetic ulcer of calf ([place]) 08/29/2016  . Diabetic ulcer of  toe ([place]) 08/29/2016  . Cerebrovascular accident (CVA) ([place]) 08/29/2016  . MDD (major depressive disorder) 08/28/2016  . Dyslipidemia associated with type 2 diabetes mellitus ([place]) 11/10/2015  . Hyperkalemia 09/17/2015  . History of vitamin D deficiency 06/20/2015  . Long term prescription opiate use 06/20/2015  . Chronic pain syndrome 06/19/2015  . Anemia of chronic kidney failure 05/01/2015  . transmetatarsal amputation of the left foot 12/13/2014  . Unilateral visual loss 11/22/2014  . 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 ([place]) 11/22/2014  . Essential hypertension 11/09/2014  . Dizziness   . Chronic combined systolic (congestive) and diastolic (congestive) heart failure ([place])   . Rectal bleeding 04/23/2014  . Anemia, iron deficiency 04/13/2014    . Noncompliance 02/02/2014  . Hyponatremia 05/19/2013  . Seizure disorder ([place]) 02/09/2013   Past Medical History:  Diagnosis Date  . Anemia   . Anxiety   . Bipolar disorder, unspecified ([place]) 02/06/2014  . Chronic combined systolic and diastolic CHF (congestive heart failure) (HCC)    EF 40% by echo and 48% by stress test  . Chronic pain syndrome   . Depression   . ESRD on hemodialysis ([place])   . GERD (gastroesophageal reflux disease)   . Glaucoma   . Headache    sinus headaches  . Hepatitis C   . High cholesterol   . History of hiatal hernia   . History of vitamin D deficiency 06/20/2015  . Hypertension   . MRSA infection ?2014   "on the back of my head; spread throughout my bloodstream"  . Neuropathy   . Refusal of blood transfusions as patient is Jehovah's Witness   . Schizoaffective disorder, unspecified condition 02/08/2014  . Seizure [facility]) dx'd 2014   "don't know what kind; last one was ~ 04/2014"  . Stroke [facility])    TIA 2009  . TIA (transient ischemic attack) 2010  . Type II diabetes mellitus (HCC)    INSULIN DEPENDENT    Family History  Problem Relation Age of Onset  . Hypertension Mother   . Diabetes Mother   . Hypertension Father   . Diabetes Father     Past Surgical History:  Procedure Laterality Date  . ABDOMINAL HYSTERECTOMY  2014  . ABDOMINAL HYSTERECTOMY  2010  . AMPUTATION Left 05/21/2013   Procedure: Left Midfoot AMPUTATION;  Surgeon: Newt Minion, MD;  Location: [facility];  Service: Orthopedics;  Laterality: Left;  Left Midfoot Amputation  . AV FISTULA PLACEMENT Left 05/04/2015   Procedure: ARTERIOVENOUS (AV) GRAFT INSERTION;  Surgeon: Angelia Mould, MD;  Location: [place];  Service: Vascular;  Laterality: Left;  . COLONOSCOPY WITH PROPOFOL N/A 06/22/2013   Procedure: COLONOSCOPY WITH PROPOFOL;  Surgeon: Jeryl Columbia, MD;  Location: [facility] ENDOSCOPY;  Service: Endoscopy;  Laterality: N/A;  . ESOPHAGOGASTRODUODENOSCOPY N/A 05/11/2015   Procedure:  ESOPHAGOGASTRODUODENOSCOPY (EGD);  Surgeon: Laurence Spates, MD;  Location: [facility] ENDOSCOPY;  Service: Endoscopy;  Laterality: N/A;  . EYE SURGERY Bilateral 2010   Lasik  . FLEXIBLE SIGMOIDOSCOPY N/A 01/17/2017   Procedure: FLEXIBLE SIGMOIDOSCOPY w/ hemorrhoid banding possible;  Surgeon: Gatha Mayer, MD;  Location: [facility] ENDOSCOPY;  Service: Endoscopy;  Laterality: N/A;  . FOOT AMPUTATION Left    due diabetic neuropathy, could not feel ulcer on bottom of foot  . REFRACTIVE SURGERY Bilateral 2013  . TONSILLECTOMY  1970's  . TUBAL LIGATION  2000   Social History   Occupational History  . Occupation: disabled  Tobacco Use  . Smoking status: Current Some  Day Smoker    Packs/day: 0.10    Years: 30.00    Pack years: 3.00    Types: Cigarettes    Last attempt to quit: 03/31/2015    Years since quitting: 2.4  . Smokeless tobacco: Never Used  Substance and Sexual Activity  . Alcohol use: Yes    Alcohol/week: 1.8 oz    Types: 3 Standard drinks or equivalent per week    Frequency: Never    Comment: last week  . Drug use: Yes    Types: "Crack" cocaine, Cocaine    Comment: LAST USE YESTERDAY  . Sexual activity: Not Currently    Birth control/protection: Abstinence

## 2017-09-09 ENCOUNTER — Emergency Department (HOSPITAL_COMMUNITY)
Admission: EM | Admit: 2017-09-09 | Discharge: 2017-09-10 | Disposition: A | Discharge status: Home / Self Care | Payer: Medicare Other | Attending: Emergency Medicine | Admitting: Emergency Medicine

## 2017-09-09 ENCOUNTER — Encounter (HOSPITAL_COMMUNITY): Payer: Self-pay | Admitting: Emergency Medicine

## 2017-09-09 DIAGNOSIS — I132 Hypertensive heart and chronic kidney disease with heart failure and with stage 5 chronic kidney disease, or end stage renal disease: Secondary | ICD-10-CM | POA: Diagnosis not present

## 2017-09-09 DIAGNOSIS — M79605 Pain in left leg: Secondary | ICD-10-CM | POA: Insufficient documentation

## 2017-09-09 DIAGNOSIS — E1122 Type 2 diabetes mellitus with diabetic chronic kidney disease: Secondary | ICD-10-CM | POA: Diagnosis not present

## 2017-09-09 DIAGNOSIS — Z91040 Latex allergy status: Secondary | ICD-10-CM | POA: Insufficient documentation

## 2017-09-09 DIAGNOSIS — N186 End stage renal disease: Secondary | ICD-10-CM | POA: Insufficient documentation

## 2017-09-09 DIAGNOSIS — I5042 Chronic combined systolic (congestive) and diastolic (congestive) heart failure: Secondary | ICD-10-CM | POA: Diagnosis not present

## 2017-09-09 DIAGNOSIS — Z79899 Other long term (current) drug therapy: Secondary | ICD-10-CM | POA: Diagnosis not present

## 2017-09-09 DIAGNOSIS — D500 Iron deficiency anemia secondary to blood loss (chronic): Secondary | ICD-10-CM | POA: Diagnosis not present

## 2017-09-09 DIAGNOSIS — Z794 Long term (current) use of insulin: Secondary | ICD-10-CM | POA: Insufficient documentation

## 2017-09-09 DIAGNOSIS — F17210 Nicotine dependence, cigarettes, uncomplicated: Secondary | ICD-10-CM | POA: Diagnosis not present

## 2017-09-09 LAB — BASIC METABOLIC PANEL
Anion gap: 18 — ABNORMAL HIGH (ref 5–15)
BUN: 73 mg/dL — ABNORMAL HIGH (ref 6–20)
CO2: 25 mmol/L (ref 22–32)
Calcium: 8.5 mg/dL — ABNORMAL LOW (ref 8.9–10.3)
Chloride: 90 mmol/L — ABNORMAL LOW (ref 101–111)
Creatinine, Ser: 4.84 mg/dL — ABNORMAL HIGH (ref 0.44–1.00)
GFR calc Af Amer: 11 mL/min — ABNORMAL LOW (ref >60)
GFR calc non Af Amer: 10 mL/min — ABNORMAL LOW (ref >60)
Glucose, Bld: 353 mg/dL — ABNORMAL HIGH (ref 65–99)
Potassium: 4.5 mmol/L (ref 3.5–5.1)
Sodium: 133 mmol/L — ABNORMAL LOW (ref 135–145)

## 2017-09-09 LAB — CBC WITH DIFFERENTIAL/PLATELET
Basophils Absolute: 0 10*3/uL (ref 0.0–0.1)
Basophils Relative: 0 %
Eosinophils Absolute: 0.1 10*3/uL (ref 0.0–0.7)
Eosinophils Relative: 2 %
HCT: 21.7 % — ABNORMAL LOW (ref 36.0–46.0)
Hemoglobin: 6.8 g/dL — CL (ref 12.0–15.0)
Lymphocytes Relative: 21 %
Lymphs Abs: 0.8 10*3/uL (ref 0.7–4.0)
MCH: 28.8 pg (ref 26.0–34.0)
MCHC: 31.3 g/dL (ref 30.0–36.0)
MCV: 91.9 fL (ref 78.0–100.0)
Monocytes Absolute: 0.3 10*3/uL (ref 0.1–1.0)
Monocytes Relative: 6 %
Neutro Abs: 2.9 10*3/uL (ref 1.7–7.7)
Neutrophils Relative %: 71 %
Platelets: 207 10*3/uL (ref 150–400)
RBC: 2.36 MIL/uL — ABNORMAL LOW (ref 3.87–5.11)
RDW: 17.8 % — ABNORMAL HIGH (ref 11.5–15.5)
WBC: 4.1 10*3/uL (ref 4.0–10.5)

## 2017-09-09 NOTE — ED Notes (Addendum)
Pt assisted to restroom via wheelchair. Pt able to pivot on legs. Pt made little urine. Pt had minimal dried blood on pad.

## 2017-09-09 NOTE — ED Triage Notes (Signed)
Per EMS, pt from [facility]. Pt c/o left upper leg pain. Knot noticeable upon palpation with some inflammation. Pt reports increased pain upon palpation. Left leg bigger than right which pt reports swelling goes down after dialysis but swelling has not decreased this time. Left foot pulse unpalpable by ems. Dialysis TThand Sat, left arm restricted. Pt missed Saturday tx and reported pain started Saturday. Pt unable to get all fluids off today during tx. Pt also reports minimal bright rectal bleeding recently in stools. EMS VS WNL.

## 2017-09-10 DIAGNOSIS — M79605 Pain in left leg: Secondary | ICD-10-CM | POA: Diagnosis not present

## 2017-09-10 MED ORDER — OXYCODONE-ACETAMINOPHEN 5-325 MG PO TABS
1.0000 | ORAL_TABLET | Freq: Once | ORAL | Status: AC
  Administered 2017-09-10: 1 via ORAL
  Filled 2017-09-10: qty 1

## 2017-09-10 NOTE — ED Provider Notes (Signed)
[facility] EMERGENCY DEPARTMENT Provider Note   CSN: [id number] Arrival date & time: 09/09/17  1945     History   Chief Complaint Chief Complaint  Patient presents with  . Leg Pain    HPI [name] is a 52 y.o. female.  HPI Patient presents to the emergency department with leg pain that is chronic for the patient but noticed 2 areas of swelling on her upper leg.  The patient states that she missed dialysis on Saturday so her legs are more swollen than normal and but this dialysis treatment did not relieve the swelling like normal.  The patient states that she noticed these 2 areas in her upper leg that were painful.  The patient states that they are noticeably smaller at this time.  Patient states that she did have some small bright bleeding after taking a laxative for constipation yesterday.  The patient states she has no other symptoms associated with this.  She has no abdominal discomfort no dizziness no shortness of breath or syncope.  The patient denies chest pain, shortness of breath, headache,blurred vision, neck pain, fever, cough, weakness, numbness, dizziness, anorexia, edema, abdominal pain, nausea, vomiting, diarrhea, rash, back pain, dysuria, hematemesis, bloody stool, near syncope, or syncope. Past Medical History:  Diagnosis Date  . Anemia   . Anxiety   . Bipolar disorder, unspecified ([place]) 02/06/2014  . Chronic combined systolic and diastolic CHF (congestive heart failure) (HCC)    EF 40% by echo and 48% by stress test  . Chronic pain syndrome   . Depression   . ESRD on hemodialysis ([place])   . GERD (gastroesophageal reflux disease)   . Glaucoma   . Headache    sinus headaches  . Hepatitis C   . High cholesterol   . History of hiatal hernia   . History of vitamin D deficiency 06/20/2015  . Hypertension   . MRSA infection ?2014   "on the back of my head; spread throughout my bloodstream"  . Neuropathy   . Refusal of blood transfusions as  patient is Jehovah's Witness   . Schizoaffective disorder, unspecified condition 02/08/2014  . Seizure [facility]) dx'd 2014   "don't know what kind; last one was ~ 04/2014"  . Stroke [facility])    TIA 2009  . TIA (transient ischemic attack) 2010  . Type II diabetes mellitus ([place])    INSULIN DEPENDENT    Patient Active Problem List   Diagnosis Date Noted  . Localized edema due to fluid overload 09/05/2017  . Lower GI bleed   . Evaluation by psychiatric service required 07/17/2017  . Volume overload 06/03/2017  . Right supracondylar humerus fracture, with nonunion, subsequent encounter 05/01/2017  . Malnutrition of moderate degree 02/28/2017  . Leg pain, left 02/25/2017  . Left leg pain 02/25/2017  . External hemorrhoids   . Diverticulosis of colon without hemorrhage   . Goals of care, counseling/discussion   . Palliative care encounter   . Cellulitis of left leg 01/16/2017  . Diabetes mellitus with complication ([place]) 32/20/2542  . Upper GI bleed 01/16/2017  . UGIB (upper gastrointestinal bleed)   . Anemia due to chronic kidney disease   . Cellulitis of left thigh 01/01/2017  . End stage renal disease ([place]) 01/01/2017  . Cocaine use disorder, severe, dependence ([place]) 08/29/2016  . Hepatitis C 08/29/2016  . Diabetic ulcer of calf ([place]) 08/29/2016  . Diabetic ulcer of toe ([place]) 08/29/2016  . Cerebrovascular accident (CVA) ([place]) 08/29/2016  . MDD (  major depressive disorder) 08/28/2016  . Dyslipidemia associated with type 2 diabetes mellitus ([place]) 11/10/2015  . Hyperkalemia 09/17/2015  . History of vitamin D deficiency 06/20/2015  . Long term prescription opiate use 06/20/2015  . Chronic pain syndrome 06/19/2015  . Anemia of chronic kidney failure 05/01/2015  . transmetatarsal amputation of the left foot 12/13/2014  . Unilateral visual loss 11/22/2014  . 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 ([place]) 11/22/2014  . Essential hypertension 11/09/2014  . Dizziness   . Chronic combined  systolic (congestive) and diastolic (congestive) heart failure ([place])   . Rectal bleeding 04/23/2014  . Anemia, iron deficiency 04/13/2014  . Noncompliance 02/02/2014  . Hyponatremia 05/19/2013  . Seizure disorder ([place]) 02/09/2013    Past Surgical History:  Procedure Laterality Date  . ABDOMINAL HYSTERECTOMY  2014  . ABDOMINAL HYSTERECTOMY  2010  . AMPUTATION Left 05/21/2013   Procedure: Left Midfoot AMPUTATION;  Surgeon: Newt Minion, MD;  Location: [facility];  Service: Orthopedics;  Laterality: Left;  Left Midfoot Amputation  . AV FISTULA PLACEMENT Left 05/04/2015   Procedure: ARTERIOVENOUS (AV) GRAFT INSERTION;  Surgeon: Angelia Mould, MD;  Location: [facility];  Service: Vascular;  Laterality: Left;  . COLONOSCOPY WITH PROPOFOL N/A 06/22/2013   Procedure: COLONOSCOPY WITH PROPOFOL;  Surgeon: Jeryl Columbia, MD;  [facility];  Service: Endoscopy;  Laterality: N/A;  . ESOPHAGOGASTRODUODENOSCOPY N/A 05/11/2015   Procedure: ESOPHAGOGASTRODUODENOSCOPY (EGD);  Surgeon: Laurence Spates, MD;  Location: [facility] ENDOSCOPY;  Service: Endoscopy;  Laterality: N/A;  . EYE SURGERY Bilateral 2010   Lasik  . FLEXIBLE SIGMOIDOSCOPY N/A 01/17/2017   Procedure: FLEXIBLE SIGMOIDOSCOPY w/ hemorrhoid banding possible;  Surgeon: Gatha Mayer, MD;  Location: [facility] ENDOSCOPY;  Service: Endoscopy;  Laterality: N/A;  . FOOT AMPUTATION Left    due diabetic neuropathy, could not feel ulcer on bottom of foot  . REFRACTIVE SURGERY Bilateral 2013  . TONSILLECTOMY  1970's  . TUBAL LIGATION  2000    OB History    Gravida Para Term Preterm AB Living   0 0 0 0 0     SAB TAB Ectopic Multiple Live Births   0 0 0           Home Medications    Prior to Admission medications   Medication Sig Start Date End Date Taking? Authorizing Provider  albuterol (PROAIR HFA) 108 (90 Base) MCG/ACT inhaler Inhale 2 puffs into the lungs every 4 (four) hours as needed for wheezing or shortness of breath.   Yes [provider]  albuterol (PROVENTIL) (2.5 MG/3ML) 0.083% nebulizer solution Take 2.5 mg by nebulization every 6 (six) hours as needed for wheezing or shortness of breath.   Yes [provider]  allopurinol (ZYLOPRIM) 100 MG tablet Take 1 tablet (100 mg total) by mouth every Tuesday, Thursday, and Saturday at 6 PM. 01/25/17  Yes Nita Sells, MD  Amino Acids-Protein Hydrolys (PRO-STAT) LIQD Take 30 mLs by mouth 2 (two) times daily.   Yes [provider]  amLODipine (NORVASC) 10 MG tablet Take 1 tablet (10 mg total) by mouth at bedtime. 09/02/16  Yes Hildred Priest, MD  atorvastatin (LIPITOR) 40 MG tablet Take 1 tablet (40 mg total) by mouth at bedtime. 09/02/16  Yes Hildred Priest, MD  bisacodyl (DULCOLAX) 10 MG suppository Place 10 mg rectally daily as needed (for constipation).   Yes [provider]  calcium acetate (PHOSLO) 667 MG capsule Take 2 capsules (1,334 mg total) by mouth 3 (three) times daily  with meals. 09/02/16  Yes Hildred Priest, MD  carvedilol (COREG) 25 MG tablet Take 1 tablet (25 mg total) by mouth 2 (two) times daily with a meal. 09/02/16  Yes Hildred Priest, MD  cloNIDine (CATAPRES) 0.1 MG tablet Take 1 tablet (0.1 mg total) by mouth every 8 (eight) hours. 09/02/16  Yes Hildred Priest, MD  diphenhydrAMINE (BENADRYL) 25 mg capsule Take 25 mg by mouth every 6 (six) hours as needed for itching.    Yes [provider]  DULoxetine (CYMBALTA) 30 MG capsule Take 30 mg by mouth daily.   Yes [provider]  ferrous sulfate 325 (65 FE) MG tablet Take 325 mg by mouth daily.   Yes [provider]  hydrocortisone (ANUSOL-HC) 2.5 % rectal cream Place rectally 2 (two) times daily. Patient taking differently: Place 1 application rectally 2 (two) times daily.  01/24/17  Yes Nita Sells, MD  insulin aspart (NOVOLOG) 100 UNIT/ML injection Inject 7 Units into the skin 3 (three)  times daily with meals. Patient taking differently: Inject 7 Units into the skin See admin instructions. 7 units into the skin three times a day before meals, plus a sliding scale: BGL 101-150 = 2 units; 151-200 = 3 units; 201-250 = 5 units; 251-300 = 7 units; 301-350 = 9 units; >350 = 11 units; >400 or <60 = CALL MD 06/07/17  Yes Helberg, Larkin Ina, MD  Insulin Detemir (LEVEMIR FLEXTOUCH) 100 UNIT/ML Pen Inject 20 Units into the skin at bedtime.   Yes [provider]  Multiple Vitamins-Minerals (MULTIVITAMIN PO) Take 1 tablet by mouth daily.   Yes [provider]  Oxycodone HCl 10 MG TABS Take 1 tablet (10 mg total) by mouth every 6 (six) hours as needed. Patient taking differently: Take 10 mg by mouth every 6 (six) hours as needed (for pain).  07/19/17  Yes Mariel Aloe, MD  OXYGEN Inhale 4 L into the lungs continuous.   Yes [provider]  pantoprazole (PROTONIX) 40 MG tablet Take 1 tablet (40 mg total) by mouth daily at 6 (six) AM. 07/20/17  Yes Mariel Aloe, MD  polyethylene glycol (MIRALAX / GLYCOLAX) packet Take 17 g by mouth 3 (three) times daily.   Yes [provider]  vitamin C (ASCORBIC ACID) 500 MG tablet Take 500 mg by mouth daily.   Yes [provider]  albuterol (PROVENTIL HFA;VENTOLIN HFA) 108 (90 Base) MCG/ACT inhaler Inhale 2 puffs into the lungs every 4 (four) hours as needed for wheezing or shortness of breath. Patient not taking: Reported on 09/09/2017 10/15/16   Horton, Barbette Hair, MD  Ferrous Sulfate (SLOW FE) 142 (45 Fe) MG TBCR Take 1 tablet by mouth daily. Patient not taking: Reported on 09/09/2017 07/19/17   Mariel Aloe, MD  insulin glargine (LANTUS) 100 UNIT/ML injection Inject 0.2 mLs (20 Units total) into the skin at bedtime. Patient not taking: Reported on 09/09/2017 06/07/17   Ina Homes, MD  LORazepam (ATIVAN) 0.5 MG tablet Take 1 tablet (0.5 mg total) by mouth every 8 (eight) hours as needed for anxiety. Patient not  taking: Reported on 09/09/2017 07/19/17   Mariel Aloe, MD  multivitamin (RENA-VIT) TABS tablet Take 1 tablet by mouth at bedtime. Patient not taking: Reported on 09/09/2017 09/02/16   Hildred Priest, MD  mupirocin ointment (BACTROBAN) 2 % Apply 1 application topically daily. 03/06/17   [provider]  Nutritional Supplements (FEEDING SUPPLEMENT, NEPRO CARB STEADY,) LIQD Take 237 mLs by mouth 3 (three) times daily between  meals. Patient not taking: Reported on 09/09/2017 02/28/17   Cristal Ford, DO  sertraline (ZOLOFT) 50 MG tablet Take 1 tablet (50 mg total) by mouth daily. Patient not taking: Reported on 09/09/2017 07/19/17   Mariel Aloe, MD  traZODone (DESYREL) 50 MG tablet Take 0.5 tablets (25 mg total) by mouth at bedtime. Patient not taking: Reported on 09/09/2017 07/19/17   Mariel Aloe, MD    Family History Family History  Problem Relation Age of Onset  . Hypertension Mother   . Diabetes Mother   . Hypertension Father   . Diabetes Father     Social History Social History   Tobacco Use  . Smoking status: Current Some Day Smoker    Packs/day: 0.10    Years: 30.00    Pack years: 3.00    Types: Cigarettes    Last attempt to quit: 03/31/2015    Years since quitting: 2.4  . Smokeless tobacco: Never Used  Substance Use Topics  . Alcohol use: Yes    Alcohol/week: 1.8 oz    Types: 3 Standard drinks or equivalent per week    Frequency: Never    Comment: last week  . Drug use: Yes    Types: "Crack" cocaine, Cocaine    Comment: LAST USE YESTERDAY     Allergies   Penicillins; Gabapentin; Lyrica [pregabalin]; Saphris [asenapine]; Tramadol; Adhesive [tape]; Latex; and Other   Review of Systems Review of Systems All other systems negative except as documented in the HPI. All pertinent positives and negatives as reviewed in the HPI.  Physical Exam Updated Vital Signs BP (!) 145/64   Pulse 89   Temp 98.7 F (37.1 C) (Oral)   Resp 16   LMP  (LMP  Unknown)   SpO2 97%   Physical Exam  Constitutional: She is oriented to person, place, and time. She appears well-developed and well-nourished. No distress.  HENT:  Head: Normocephalic and atraumatic.  Mouth/Throat: Oropharynx is clear and moist.  Eyes: Pupils are equal, round, and reactive to light.  Neck: Normal range of motion. Neck supple.  Cardiovascular: Normal rate, regular rhythm and normal heart sounds. Exam reveals no gallop and no friction rub.  No murmur heard. Pulmonary/Chest: Effort normal and breath sounds normal. No respiratory distress. She has no wheezes.  Abdominal: Soft. Bowel sounds are normal. She exhibits no distension. There is no tenderness.  Musculoskeletal: She exhibits edema.  She has bilateral edema.  There is in the upper groin area minimal the palpable area that appears to be may be a lymph node.  There is no sign of infection the calf is not tender.  Neurological: She is alert and oriented to person, place, and time. She exhibits normal muscle tone. Coordination normal.  Skin: Skin is warm and dry. Capillary refill takes less than 2 seconds. No rash noted. No erythema.  Psychiatric: She has a normal mood and affect. Her behavior is normal.  Nursing note and vitals reviewed.    ED Treatments / Results  Labs (all labs ordered are listed, but only abnormal results are displayed) Labs Reviewed  BASIC METABOLIC PANEL - Abnormal; Notable for the following components:      Result Value   Sodium 133 (*)    Chloride 90 (*)    Glucose, Bld 353 (*)    BUN 73 (*)    Creatinine, Ser 4.84 (*)    Calcium 8.5 (*)    GFR calc non Af Amer 10 (*)    GFR calc Af  Amer 11 (*)    Anion gap 18 (*)    All other components within normal limits  CBC WITH DIFFERENTIAL/PLATELET - Abnormal; Notable for the following components:   RBC 2.36 (*)    Hemoglobin 6.8 (*)    HCT 21.7 (*)    RDW 17.8 (*)    All other components within normal limits    EKG  EKG  Interpretation None       Radiology No results found.  Procedures Procedures (including critical care time)  Medications Ordered in ED Medications  oxyCODONE-acetaminophen (PERCOCET/ROXICET) 5-325 MG per tablet 1 tablet (not administered)     Initial Impression / Assessment and Plan / ED Course  I have reviewed the triage vital signs and the nursing notes.  Pertinent labs & imaging results that were available during my care of the patient were reviewed by me and considered in my medical decision making (see chart for details).     I spoke with nephrology who knows the patient very well.  They feel that with her hemoglobin at 6.8 and being asymptomatic they will recheck in on Thursday and assess further at that time.  The patient does not have any symptomatic anemia at this point.  The leg pain is chronic based on review of her chart along with speaking with nephrology.  The patient will be discharged back to her nursing facility.  Told return here as needed.  Final Clinical Impressions(s) / ED Diagnoses   Final diagnoses:  None    ED Discharge Orders    None       Dalia Heading, PA-C 09/10/17 0040    Fredia Sorrow, MD 09/11/17 [phone]

## 2017-09-10 NOTE — ED Notes (Signed)
Report given to [name], LPN at [facility] about pt's discharge and follow up care.

## 2017-09-10 NOTE — Discharge Instructions (Signed)
Return here as needed.  Follow-up with your nephrologist. °

## 2017-09-13 ENCOUNTER — Emergency Department (HOSPITAL_COMMUNITY)
Admission: EM | Admit: 2017-09-13 | Discharge: 2017-09-14 | Disposition: A | Discharge status: Nursing Home (Includes ICF) | Payer: Medicare Other | Attending: Emergency Medicine | Admitting: Emergency Medicine

## 2017-09-13 ENCOUNTER — Encounter (HOSPITAL_COMMUNITY): Payer: Self-pay | Admitting: Nurse Practitioner

## 2017-09-13 DIAGNOSIS — Z8673 Personal history of transient ischemic attack (TIA), and cerebral infarction without residual deficits: Secondary | ICD-10-CM | POA: Insufficient documentation

## 2017-09-13 DIAGNOSIS — Z79899 Other long term (current) drug therapy: Secondary | ICD-10-CM | POA: Insufficient documentation

## 2017-09-13 DIAGNOSIS — F17210 Nicotine dependence, cigarettes, uncomplicated: Secondary | ICD-10-CM | POA: Insufficient documentation

## 2017-09-13 DIAGNOSIS — E119 Type 2 diabetes mellitus without complications: Secondary | ICD-10-CM | POA: Diagnosis not present

## 2017-09-13 DIAGNOSIS — I132 Hypertensive heart and chronic kidney disease with heart failure and with stage 5 chronic kidney disease, or end stage renal disease: Secondary | ICD-10-CM | POA: Diagnosis not present

## 2017-09-13 DIAGNOSIS — Z794 Long term (current) use of insulin: Secondary | ICD-10-CM | POA: Insufficient documentation

## 2017-09-13 DIAGNOSIS — I5042 Chronic combined systolic (congestive) and diastolic (congestive) heart failure: Secondary | ICD-10-CM | POA: Insufficient documentation

## 2017-09-13 DIAGNOSIS — M79605 Pain in left leg: Secondary | ICD-10-CM | POA: Diagnosis present

## 2017-09-13 DIAGNOSIS — Z992 Dependence on renal dialysis: Secondary | ICD-10-CM | POA: Diagnosis not present

## 2017-09-13 DIAGNOSIS — N186 End stage renal disease: Secondary | ICD-10-CM | POA: Diagnosis not present

## 2017-09-13 NOTE — ED Triage Notes (Signed)
Pt states she was at dialysis today and would like to be evaluated for "knots on her thigh left thigh and groin" Express dissatisfaction for the last evaluation of the same on 09/09/17, concerned she might have "blood clots"

## 2017-09-13 NOTE — Discharge Instructions (Signed)
Have ultrasound done tomorrow at [facility]-- do not check back into the ED, go to the ultrasound dept.  Would recommend going after 8am.  They go home for the day at 7pm so make sure to go before then. Follow-up with your primary care doctor. Return here for new concerns.

## 2017-09-13 NOTE — ED Provider Notes (Signed)
[facility] DEPT Provider Note   CSN: [id number] Arrival date & time: 09/13/17  1653     History   Chief Complaint No chief complaint on file.   HPI [name] is a 52 y.o. female.  The history is provided by the patient and medical records.    52 year old female with history of anemia, anxiety, congestive heart failure, chronic pain, end-stage renal disease on hemodialysis, hepatitis C, vitamin D deficiency hypertension, neuropathy, prior stroke, diabetes, presenting to the ED with left leg pain.  States she has noticed some knots in her left thigh for "a while".  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  States she went to [facility] for this recently had blood work done but no ultrasound.  States that is why she is here today.  She has not had any fever or chills.  She did complete all of her dialysis sessions this week as scheduled.  She denies any chest pain or shortness of breath.  Past Medical History:  Diagnosis Date  . Anemia   . Anxiety   . Bipolar disorder, unspecified ([place]) 02/06/2014  . Chronic combined systolic and diastolic CHF (congestive heart failure) (HCC)    EF 40% by echo and 48% by stress test  . Chronic pain syndrome   . Depression   . ESRD on hemodialysis ([place])   . GERD (gastroesophageal reflux disease)   . Glaucoma   . Headache    sinus headaches  . Hepatitis C   . High cholesterol   . History of hiatal hernia   . History of vitamin D deficiency 06/20/2015  . Hypertension   . MRSA infection ?2014   "on the back of my head; spread throughout my bloodstream"  . Neuropathy   . Refusal of blood transfusions as patient is Jehovah's Witness   . Schizoaffective disorder, unspecified condition 02/08/2014  . Seizure [facility]) dx'd 2014   "don't know what kind; last one was ~ 04/2014"  . Stroke  [facility])    TIA 2009  . TIA (transient ischemic attack) 2010  . Type II diabetes mellitus ([place])    INSULIN DEPENDENT    Patient Active Problem List   Diagnosis Date Noted  . Localized edema due to fluid overload 09/05/2017  . Lower GI bleed   . Evaluation by psychiatric service required 07/17/2017  . Volume overload 06/03/2017  . Right supracondylar humerus fracture, with nonunion, subsequent encounter 05/01/2017  . Malnutrition of moderate degree 02/28/2017  . Leg pain, left 02/25/2017  . Left leg pain 02/25/2017  . External hemorrhoids   . Diverticulosis of colon without hemorrhage   . Goals of care, counseling/discussion   . Palliative care encounter   . Cellulitis of left leg 01/16/2017  . Diabetes mellitus with complication ([place]) 37/05/6268  . Upper GI bleed 01/16/2017  . UGIB (upper gastrointestinal bleed)   . Anemia due to chronic kidney disease   . Cellulitis of left thigh 01/01/2017  . End stage renal disease ([place]) 01/01/2017  . Cocaine use disorder, severe, dependence ([place]) 08/29/2016  . Hepatitis C 08/29/2016  . Diabetic ulcer of calf ([place]) 08/29/2016  . Diabetic ulcer of toe ([place]) 08/29/2016  . Cerebrovascular accident (CVA) ([place]) 08/29/2016  . MDD (major depressive disorder) 08/28/2016  . Dyslipidemia associated with type 2 diabetes mellitus ([place]) 11/10/2015  . Hyperkalemia 09/17/2015  .  History of vitamin D deficiency 06/20/2015  . Long term prescription opiate use 06/20/2015  . Chronic pain syndrome 06/19/2015  . Anemia of chronic kidney failure 05/01/2015  . transmetatarsal amputation of the left foot 12/13/2014  . Unilateral visual loss 11/22/2014  . 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 ([place]) 11/22/2014  . Essential hypertension 11/09/2014  . Dizziness   . Chronic combined systolic (congestive) and diastolic (congestive) heart failure ([place])   . Rectal bleeding 04/23/2014  . Anemia, iron deficiency 04/13/2014  . Noncompliance 02/02/2014  .  Hyponatremia 05/19/2013  . Seizure disorder ([place]) 02/09/2013    Past Surgical History:  Procedure Laterality Date  . ABDOMINAL HYSTERECTOMY  2014  . ABDOMINAL HYSTERECTOMY  2010  . AMPUTATION Left 05/21/2013   Procedure: Left Midfoot AMPUTATION;  Surgeon: Newt Minion, MD;  Location: [facility];  Service: Orthopedics;  Laterality: Left;  Left Midfoot Amputation  . AV FISTULA PLACEMENT Left 05/04/2015   Procedure: ARTERIOVENOUS (AV) GRAFT INSERTION;  Surgeon: Angelia Mould, MD;  Location: [facility];  Service: Vascular;  Laterality: Left;  . COLONOSCOPY WITH PROPOFOL N/A 06/22/2013   Procedure: COLONOSCOPY WITH PROPOFOL;  Surgeon: Jeryl Columbia, MD;  Location: [facility] ENDOSCOPY;  Service: Endoscopy;  Laterality: N/A;  . ESOPHAGOGASTRODUODENOSCOPY N/A 05/11/2015   Procedure: ESOPHAGOGASTRODUODENOSCOPY (EGD);  Surgeon: Laurence Spates, MD;  Location: [facility] ENDOSCOPY;  Service: Endoscopy;  Laterality: N/A;  . EYE SURGERY Bilateral 2010   Lasik  . FLEXIBLE SIGMOIDOSCOPY N/A 01/17/2017   Procedure: FLEXIBLE SIGMOIDOSCOPY w/ hemorrhoid banding possible;  Surgeon: Gatha Mayer, MD;  Location: [facility] ENDOSCOPY;  Service: Endoscopy;  Laterality: N/A;  . FOOT AMPUTATION Left    due diabetic neuropathy, could not feel ulcer on bottom of foot  . REFRACTIVE SURGERY Bilateral 2013  . TONSILLECTOMY  1970's  . TUBAL LIGATION  2000    OB History    Gravida Para Term Preterm AB Living   0 0 0 0 0     SAB TAB Ectopic Multiple Live Births   0 0 0           Home Medications    Prior to Admission medications   Medication Sig Start Date End Date Taking? Authorizing Provider  albuterol (PROAIR HFA) 108 (90 Base) MCG/ACT inhaler Inhale 2 puffs into the lungs every 4 (four) hours as needed for wheezing or shortness of breath.    [provider]  albuterol (PROVENTIL HFA;VENTOLIN HFA) 108 (90 Base) MCG/ACT inhaler Inhale 2 puffs into the lungs every 4 (four) hours as needed for wheezing or shortness of  breath. Patient not taking: Reported on 09/09/2017 10/15/16   [place], Barbette Hair, MD  albuterol (PROVENTIL) (2.5 MG/3ML) 0.083% nebulizer solution Take 2.5 mg by nebulization every 6 (six) hours as needed for wheezing or shortness of breath.    [provider]  allopurinol (ZYLOPRIM) 100 MG tablet Take 1 tablet (100 mg total) by mouth every Tuesday, Thursday, and Saturday at 6 PM. 01/25/17   Nita Sells, MD  Amino Acids-Protein Hydrolys (PRO-STAT) LIQD Take 30 mLs by mouth 2 (two) times daily.    [provider]  amLODipine (NORVASC) 10 MG tablet Take 1 tablet (10 mg total) by mouth at bedtime. 09/02/16   Hildred Priest, MD  atorvastatin (LIPITOR) 40 MG tablet Take 1 tablet (40 mg total) by mouth at bedtime. 09/02/16   Hildred Priest, MD  bisacodyl (DULCOLAX) 10 MG suppository Place 10 mg rectally daily as needed (for constipation).    [provider]  calcium acetate (PHOSLO) 667 MG capsule Take 2 capsules (1,334 mg total) by mouth 3 (three) times daily with meals. 09/02/16   Hildred Priest, MD  carvedilol (COREG) 25 MG tablet Take 1 tablet (25 mg total) by mouth 2 (two) times daily with a meal. 09/02/16   Hildred Priest, MD  cloNIDine (CATAPRES) 0.1 MG tablet Take 1 tablet (0.1 mg total) by mouth every 8 (eight) hours. 09/02/16   Hildred Priest, MD  diphenhydrAMINE (BENADRYL) 25 mg capsule Take 25 mg by mouth every 6 (six) hours as needed for itching.     [provider]  DULoxetine (CYMBALTA) 30 MG capsule Take 30 mg by mouth daily.    [provider]  Ferrous Sulfate (SLOW FE) 142 (45 Fe) MG TBCR Take 1 tablet by mouth daily. Patient not taking: Reported on 09/09/2017 07/19/17   Mariel Aloe, MD  ferrous sulfate 325 (65 FE) MG tablet Take 325 mg by mouth daily.    [provider]  hydrocortisone (ANUSOL-HC) 2.5 % rectal cream Place rectally 2 (two) times daily. Patient taking  differently: Place 1 application rectally 2 (two) times daily.  01/24/17   Nita Sells, MD  insulin aspart (NOVOLOG) 100 UNIT/ML injection Inject 7 Units into the skin 3 (three) times daily with meals. Patient taking differently: Inject 7 Units into the skin See admin instructions. 7 units into the skin three times a day before meals, plus a sliding scale: BGL 101-150 = 2 units; 151-200 = 3 units; 201-250 = 5 units; 251-300 = 7 units; 301-350 = 9 units; >350 = 11 units; >400 or <60 = CALL MD 06/07/17   Ina Homes, MD  Insulin Detemir (LEVEMIR FLEXTOUCH) 100 UNIT/ML Pen Inject 20 Units into the skin at bedtime.    [provider]  insulin glargine (LANTUS) 100 UNIT/ML injection Inject 0.2 mLs (20 Units total) into the skin at bedtime. Patient not taking: Reported on 09/09/2017 06/07/17   Ina Homes, MD  LORazepam (ATIVAN) 0.5 MG tablet Take 1 tablet (0.5 mg total) by mouth every 8 (eight) hours as needed for anxiety. Patient not taking: Reported on 09/09/2017 07/19/17   Mariel Aloe, MD  Multiple Vitamins-Minerals (MULTIVITAMIN PO) Take 1 tablet by mouth daily.    [provider]  multivitamin (RENA-VIT) TABS tablet Take 1 tablet by mouth at bedtime. Patient not taking: Reported on 09/09/2017 09/02/16   Hildred Priest, MD  mupirocin ointment (BACTROBAN) 2 % Apply 1 application topically daily. 03/06/17   [provider]  Nutritional Supplements (FEEDING SUPPLEMENT, NEPRO CARB STEADY,) LIQD Take 237 mLs by mouth 3 (three) times daily between meals. Patient not taking: Reported on 09/09/2017 02/28/17   Cristal Ford, DO  Oxycodone HCl 10 MG TABS Take 1 tablet (10 mg total) by mouth every 6 (six) hours as needed. Patient taking differently: Take 10 mg by mouth every 6 (six) hours as needed (for pain).  07/19/17   Mariel Aloe, MD  OXYGEN Inhale 4 L into the lungs continuous.    [provider]  pantoprazole (PROTONIX) 40 MG tablet Take 1  tablet (40 mg total) by mouth daily at 6 (six) AM. 07/20/17   Mariel Aloe, MD  polyethylene glycol (MIRALAX / GLYCOLAX) packet Take 17 g by mouth 3 (three) times daily.    [provider]  sertraline (ZOLOFT) 50 MG tablet Take 1 tablet (50 mg total) by mouth daily. Patient not taking: Reported on 09/09/2017 07/19/17   Mariel Aloe, MD  traZODone (DESYREL) 50 MG tablet Take 0.5 tablets (25 mg total) by mouth at bedtime. Patient not taking: Reported on 09/09/2017 07/19/17   Mariel Aloe, MD  vitamin C (ASCORBIC ACID) 500 MG tablet Take 500 mg by mouth daily.    [provider]    Family History Family History  Problem Relation Age of Onset  . Hypertension Mother   . Diabetes Mother   . Hypertension Father   . Diabetes Father     Social History Social History   Tobacco Use  . Smoking status: Current Some Day Smoker    Packs/day: 0.10    Years: 30.00    Pack years: 3.00    Types: Cigarettes    Last attempt to quit: 03/31/2015    Years since quitting: 2.4  . Smokeless tobacco: Never Used  Substance Use Topics  . Alcohol use: Yes    Alcohol/week: 1.8 oz    Types: 3 Standard drinks or equivalent per week    Frequency: Never    Comment: last week  . Drug use: Yes    Types: "Crack" cocaine, Cocaine    Comment: LAST USE YESTERDAY     Allergies   Penicillins; Gabapentin; Lyrica [pregabalin]; Saphris [asenapine]; Tramadol; Adhesive [tape]; Latex; and Other   Review of Systems Review of Systems  Musculoskeletal: Positive for arthralgias.  All other systems reviewed and are negative.    Physical Exam Updated Vital Signs BP (!) 166/71 (BP Location: Right Arm)   Pulse 88   Temp 98 F (36.7 C) (Oral)   Resp 20   LMP  (LMP Unknown)   SpO2 100%   Physical Exam  Constitutional: She is oriented to person, place, and time. She appears well-developed and well-nourished.  HENT:  Head: Normocephalic and atraumatic.  Mouth/Throat: Oropharynx is clear  and moist.  Eyes: Conjunctivae and EOM are normal. Pupils are equal, round, and reactive to light.  Neck: Normal range of motion.  Cardiovascular: Normal rate, regular rhythm and normal heart sounds.  Pulmonary/Chest: Effort normal and breath sounds normal. No stridor. No respiratory distress.  Abdominal: Soft. Bowel sounds are normal. There is no tenderness. There is no rebound.  Musculoskeletal: Normal range of motion.  Left groin have 2 very small, soft knots which may represent lymph nodes; these are non-tender to palpation; no overlying erythema, induration, or signs of cellulitis; no palpable cords  Neurological: She is alert and oriented to person, place, and time.  Skin: Skin is warm and dry.  Psychiatric: She has a normal mood and affect.  Nursing note and vitals reviewed.    ED Treatments / Results  Labs (all labs ordered are listed, but only abnormal results are displayed) Labs Reviewed - No data to display  EKG  EKG Interpretation None       Radiology No results found.  Procedures Procedures (including critical care time)  Medications Ordered in ED Medications - No data to display   Initial Impression / Assessment and Plan / ED Course  I have reviewed the triage vital signs and the nursing notes.  Pertinent labs & imaging results that were available during my care of the patient were reviewed by me and considered in my medical decision making (see chart for details).  52 year old female here with leg pain.  She is been seen multiple times recently for same.  She complains of "knots" in her left thigh.  She was seen recently at [facility] for this but states "I did not get an ultrasound",  that is why came back today.  On exam she does have 2 small, soft but palpable knots in the left groin, these are more consistent with lymph nodes than DVT.  She has no overlying erythema, warmth to touch, or findings of cellulitis of the leg.  There are no palpable cords.  She  has no history of DVT or PE.  Unfortunately venous sonography is not available, will arrange for her to have this done tomorrow at [facility].  Patient given drink and sandwich here in the ED, tolerating well.  VSS.  Will transport back to her facility.  Nursing staff made aware of need for [place] tomorrow.  Patient discharged in stable condition.  Final Clinical Impressions(s) / ED Diagnoses   Final diagnoses:  Left leg pain    ED Discharge Orders    None       Larene Pickett, PA-C 09/14/17 0119    Drenda Freeze, MD 09/17/17 1233

## 2017-09-14 ENCOUNTER — Ambulatory Visit (HOSPITAL_COMMUNITY)
Admission: RE | Admit: 2017-09-14 | Discharge: 2017-09-14 | Disposition: A | Discharge status: Home / Self Care | Payer: Medicare Other | Source: Ambulatory Visit | Attending: Internal Medicine | Admitting: Internal Medicine

## 2017-09-14 DIAGNOSIS — R1909 Other intra-abdominal and pelvic swelling, mass and lump: Secondary | ICD-10-CM | POA: Insufficient documentation

## 2017-09-14 DIAGNOSIS — M79609 Pain in unspecified limb: Secondary | ICD-10-CM

## 2017-09-14 NOTE — Progress Notes (Signed)
VASCULAR LAB PRELIMINARY  PRELIMINARY  PRELIMINARY  PRELIMINARY  Left lower extremity venous duplex completed.    Preliminary report:  There is no DVT or SVT noted in the left lower extremity.  Enlarged lymph nodes noted in the groin and proximal thigh.  Although I can palpate other knots, I cannot visualize them with ultrasound.   Jet Armbrust, RVT 09/14/2017, 2:57 PM

## 2017-09-15 IMAGING — US US ABDOMEN COMPLETE
1 series · 13 of 25 positions shown · non-contrast
Comparison: None.

CLINICAL DATA: Right upper quadrant pain, nausea and vomiting for 2
weeks.

EXAM:
ULTRASOUND ABDOMEN COMPLETE

[Series 1: us abdomen complete · 0.19mm/px · 13 of 88 slices shown]
[im 1/88]
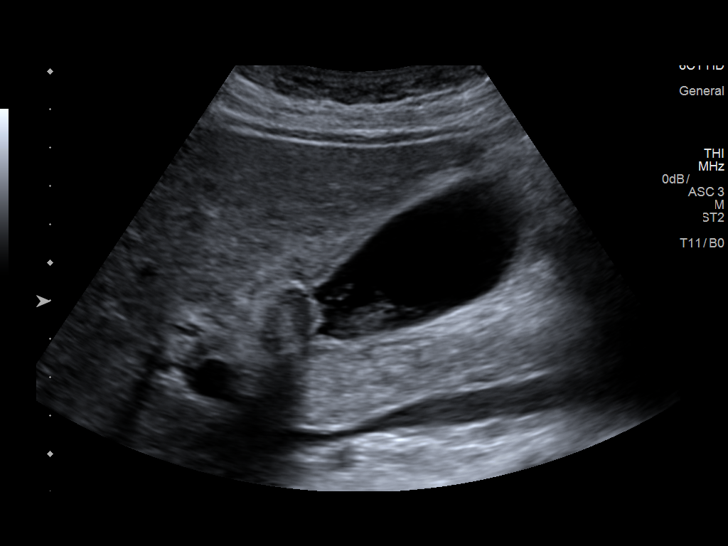
[im 8/88]
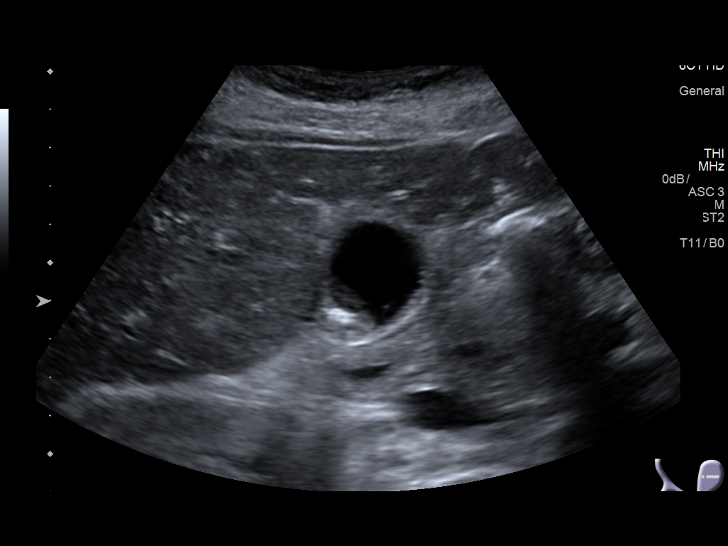
[im 15/88]
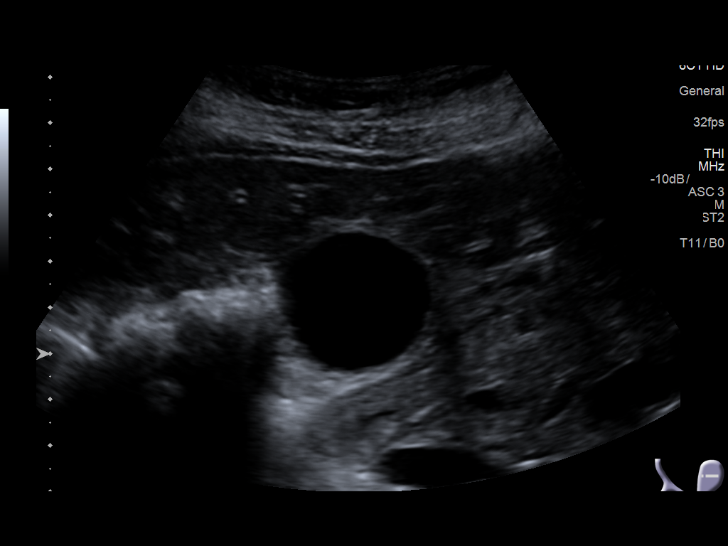
[im 22/88]
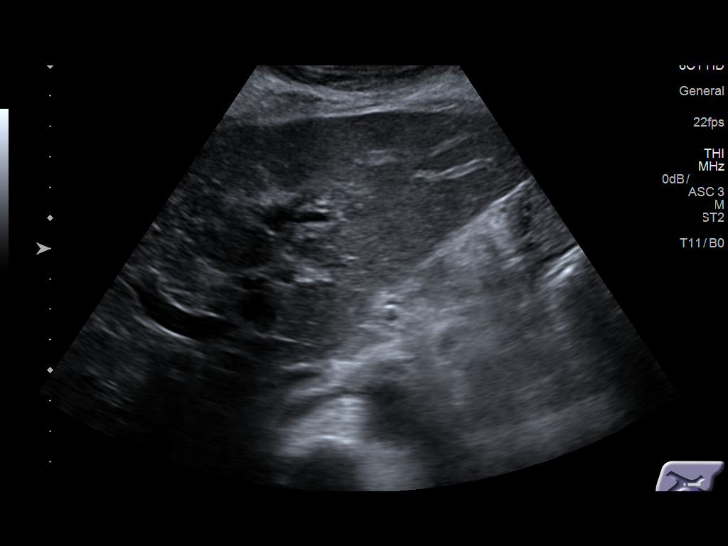
[im 30/88]
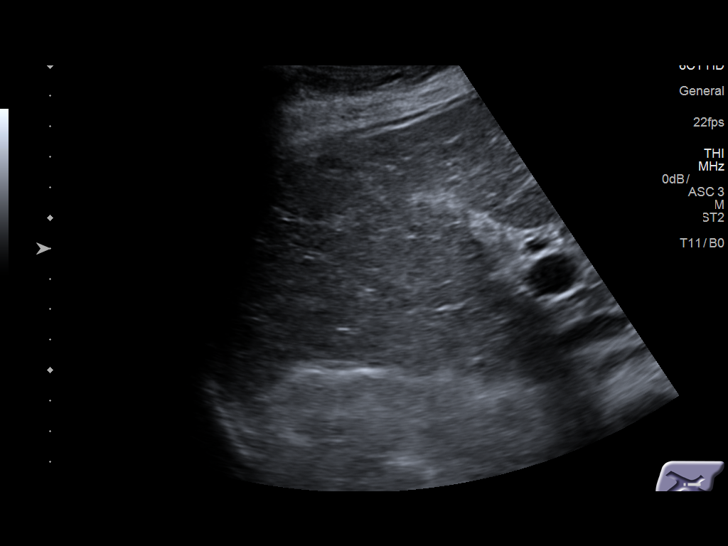
[im 37/88]
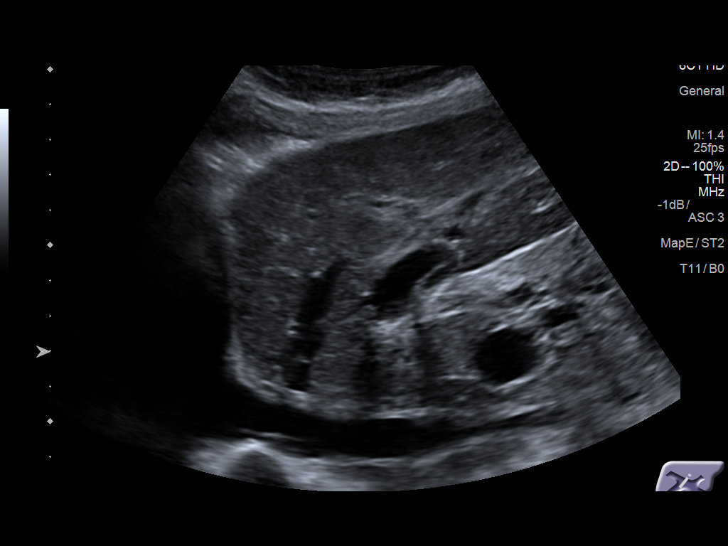
[im 44/88]
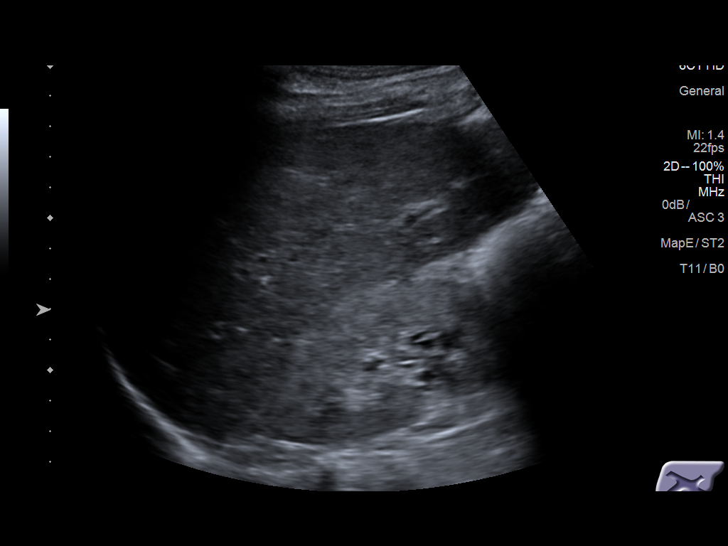
[im 51/88]
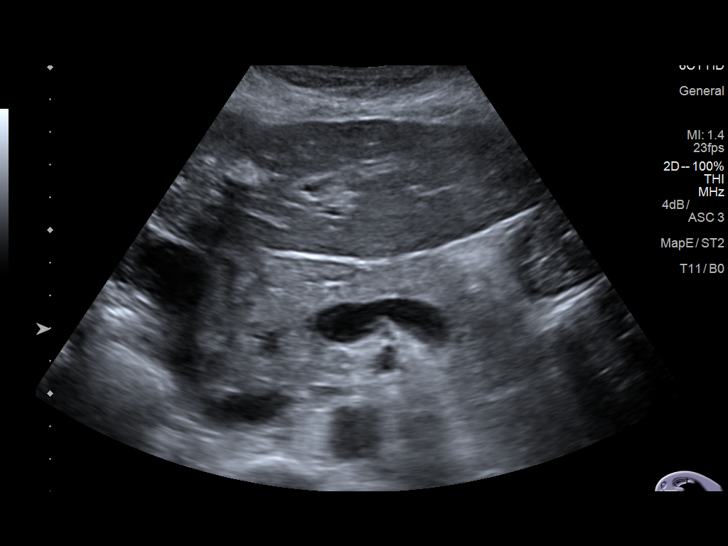
[im 59/88]
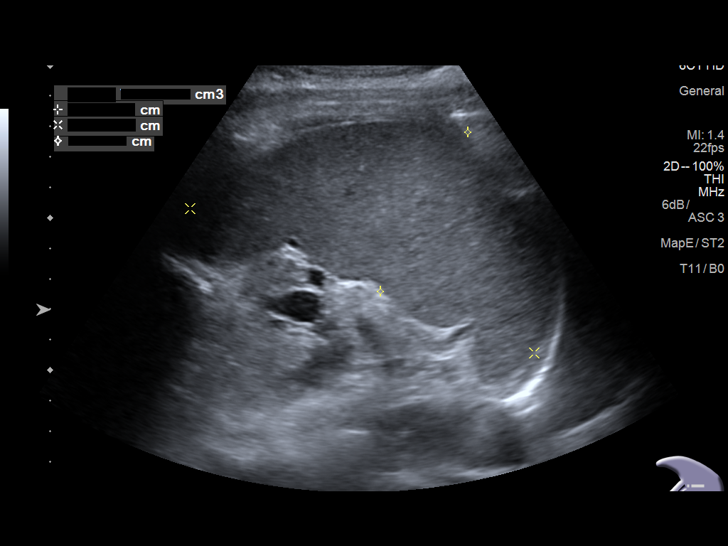
[im 66/88]
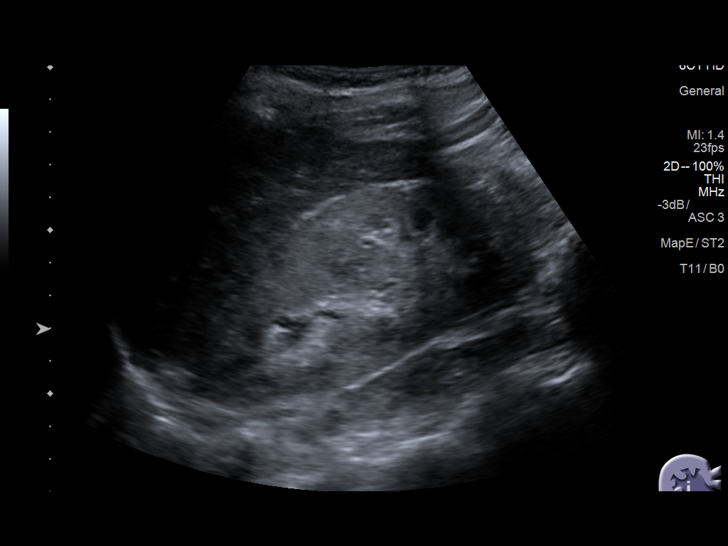
[im 73/88]
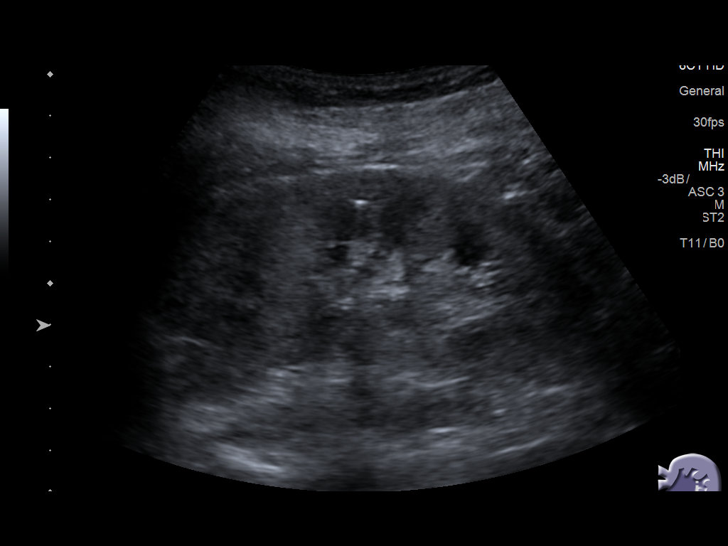
[im 80/88]
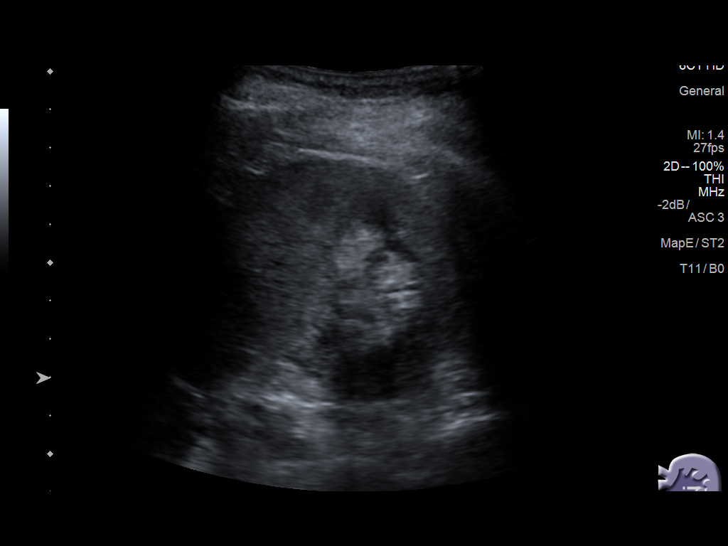
[im 88/88]
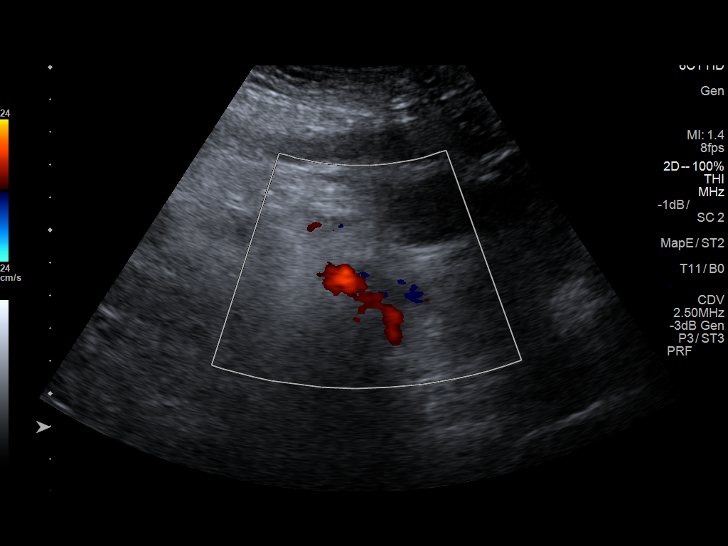

[13 of 25 positions shown; findings below may reference images not displayed]

FINDINGS: Gallbladder: There is a 10 mm gallstone in the gallbladder lumen.
There also is a small volume intraluminal sludge. There is mild
gallbladder mural thickening, 4.6 mm. There is no pericholecystic
fluid. The patient was not tender over the gallbladder.

Common bile duct: Diameter: 5 mm

Liver: There is generalized irregular coarsening of hepatic
parenchymal echotexture without discrete lesion. There are subtle
morphologic irregularities at the surface of the liver which raise
the question of cirrhosis. There is no intrahepatic bile duct
dilatation.

IVC: No abnormality visualized.

Pancreas: Unremarkable

Spleen: Upper normal in size.  No focal lesions.

Right Kidney: Length: 11.1 cm. Increased echogenicity suggesting
medical renal disease. No hydronephrosis.

Left Kidney: Length: 11.3 cm.. Increased echogenicity suggesting
medical renal disease. No hydronephrosis.

Abdominal aorta: Upper and mid portions are normal. Distal abdominal
aorta not visible.

Other findings: No ascites
IMPRESSION: 1. Cholelithiasis. Mild gallbladder mural thickening or without
pericholecystic fluid or Murphy's sign.
2. Coarsened hepatic parenchymal echotexture and subtle morphologic
irregularities raising the question of cirrhosis. No focal liver
lesions. No bile duct dilatation.
3. Increased echogenicity of the renal parenchyma bilaterally,
suggesting medical renal disease. No hydronephrosis.

## 2017-09-18 ENCOUNTER — Ambulatory Visit (INDEPENDENT_AMBULATORY_CARE_PROVIDER_SITE_OTHER): Payer: Medicare Other | Admitting: Orthopedic Surgery

## 2017-10-30 IMAGING — CR DG TOE GREAT 2+V*R*
2 series · 2 of 2 positions shown · non-contrast
Comparison: None

CLINICAL DATA: Right toe pain, injury last week.

EXAM:
RIGHT GREAT TOE

[AP]
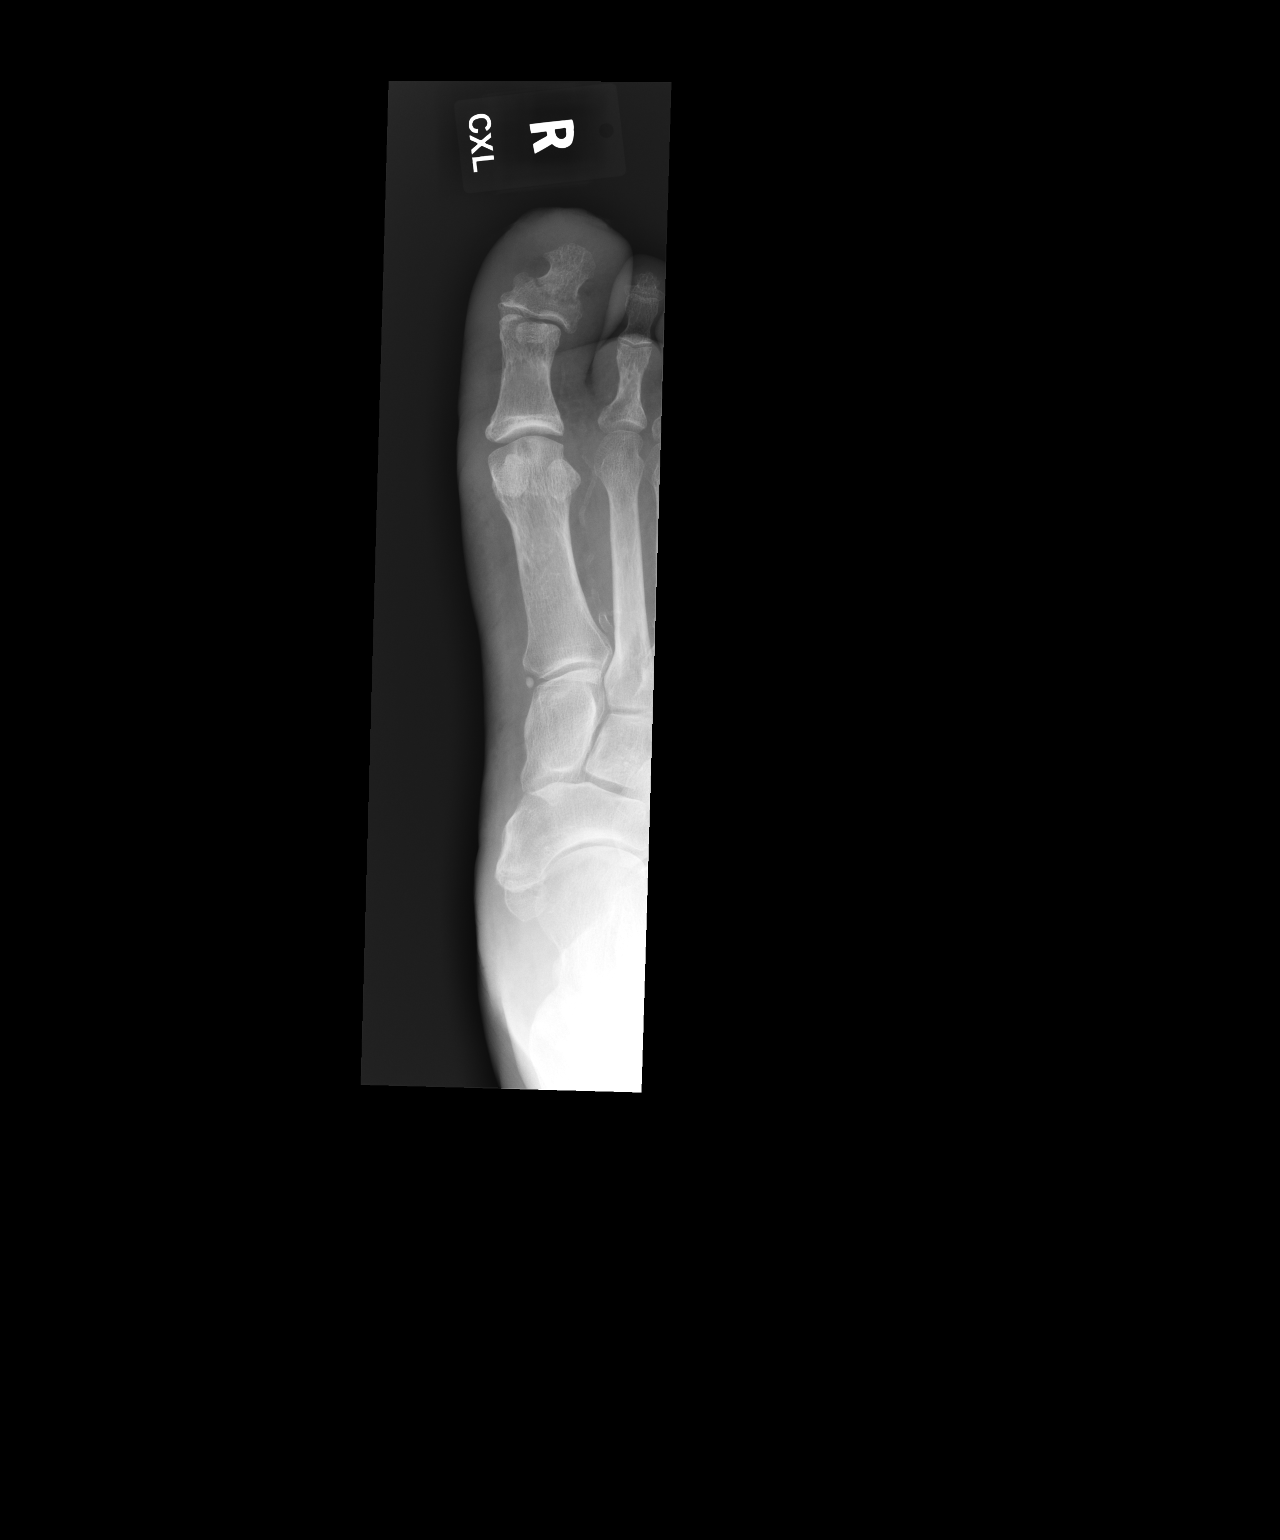

[lateral]
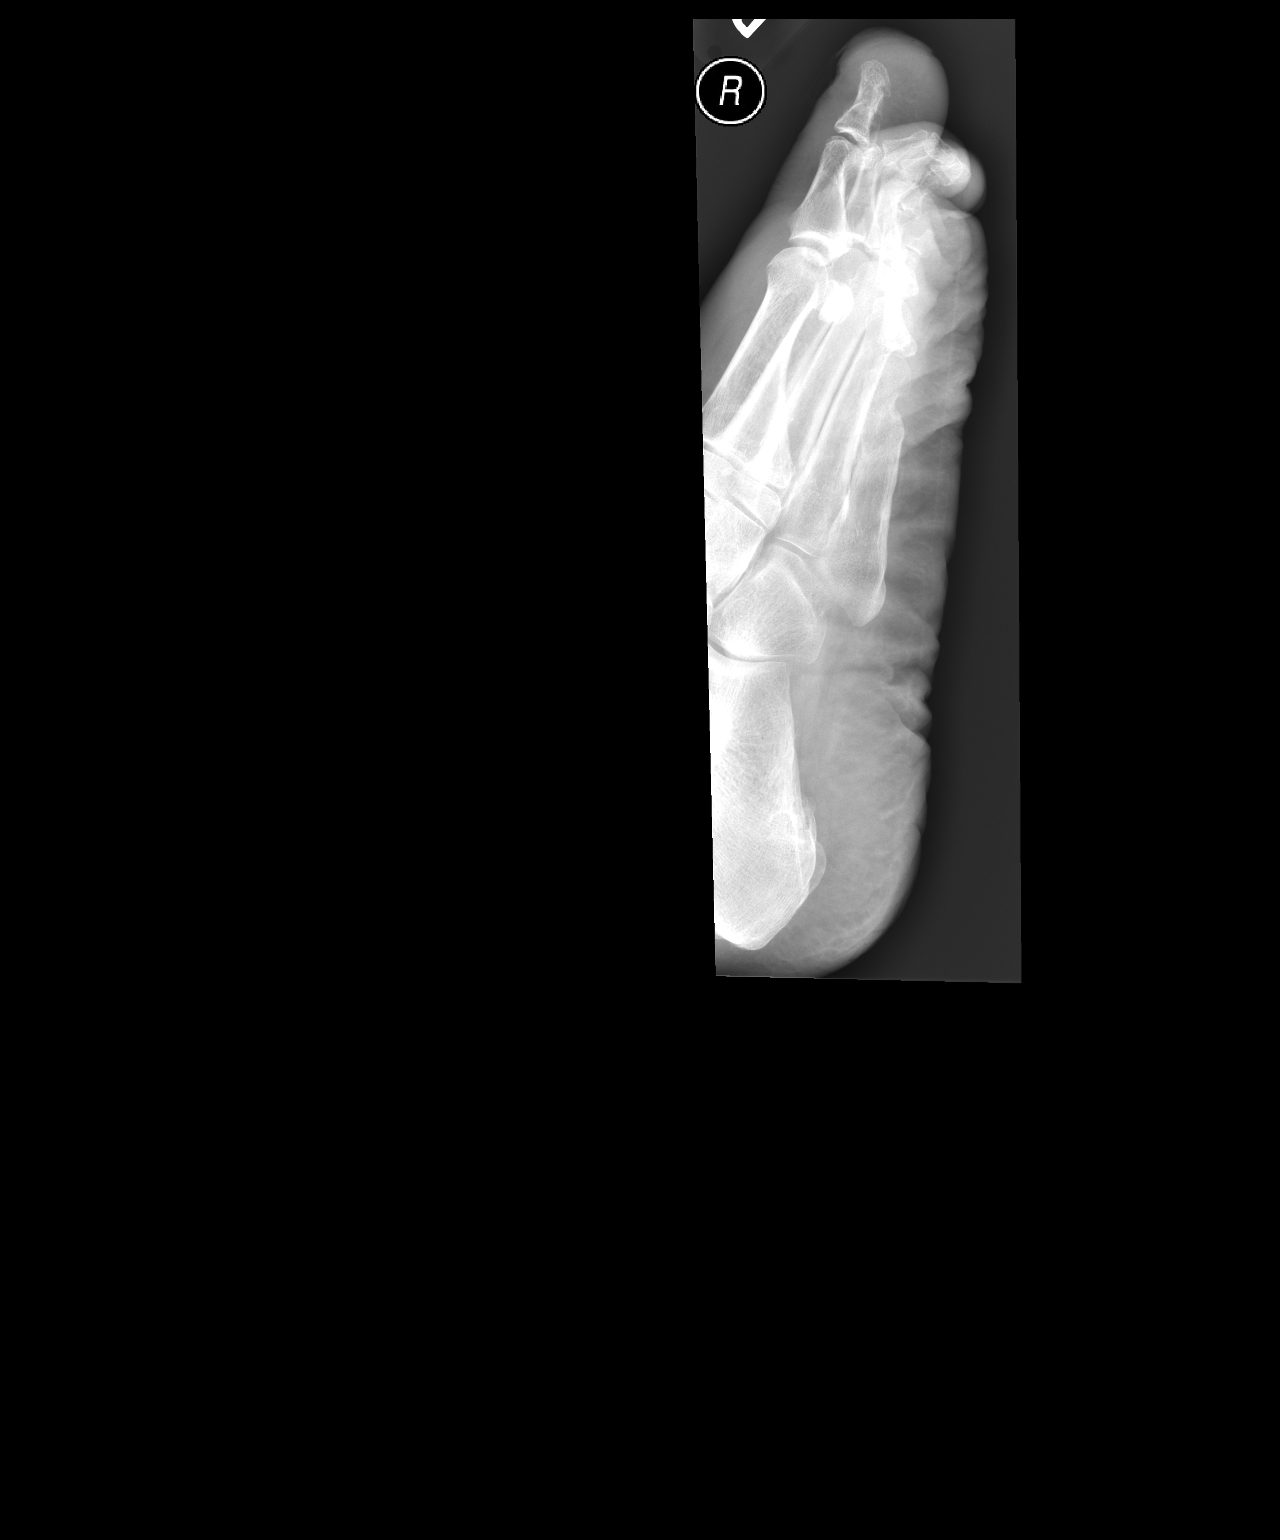

[2 of 2 positions shown; findings below may reference images not displayed]

FINDINGS: Nondisplaced fracture through the distal phalanx of the right great
toe. No additional acute bony abnormality. No subluxation or
dislocation. Soft tissues are intact.
IMPRESSION: Nondisplaced fracture through the distal phalanx of the right great
toe.

## 2017-10-30 IMAGING — CR DG CHEST 1V PORT
1 series · 1 of 1 positions shown · non-contrast
Comparison: 03/03/2016

CLINICAL DATA: Cough, volume overload

EXAM:
PORTABLE CHEST 1 VIEW

[AP]
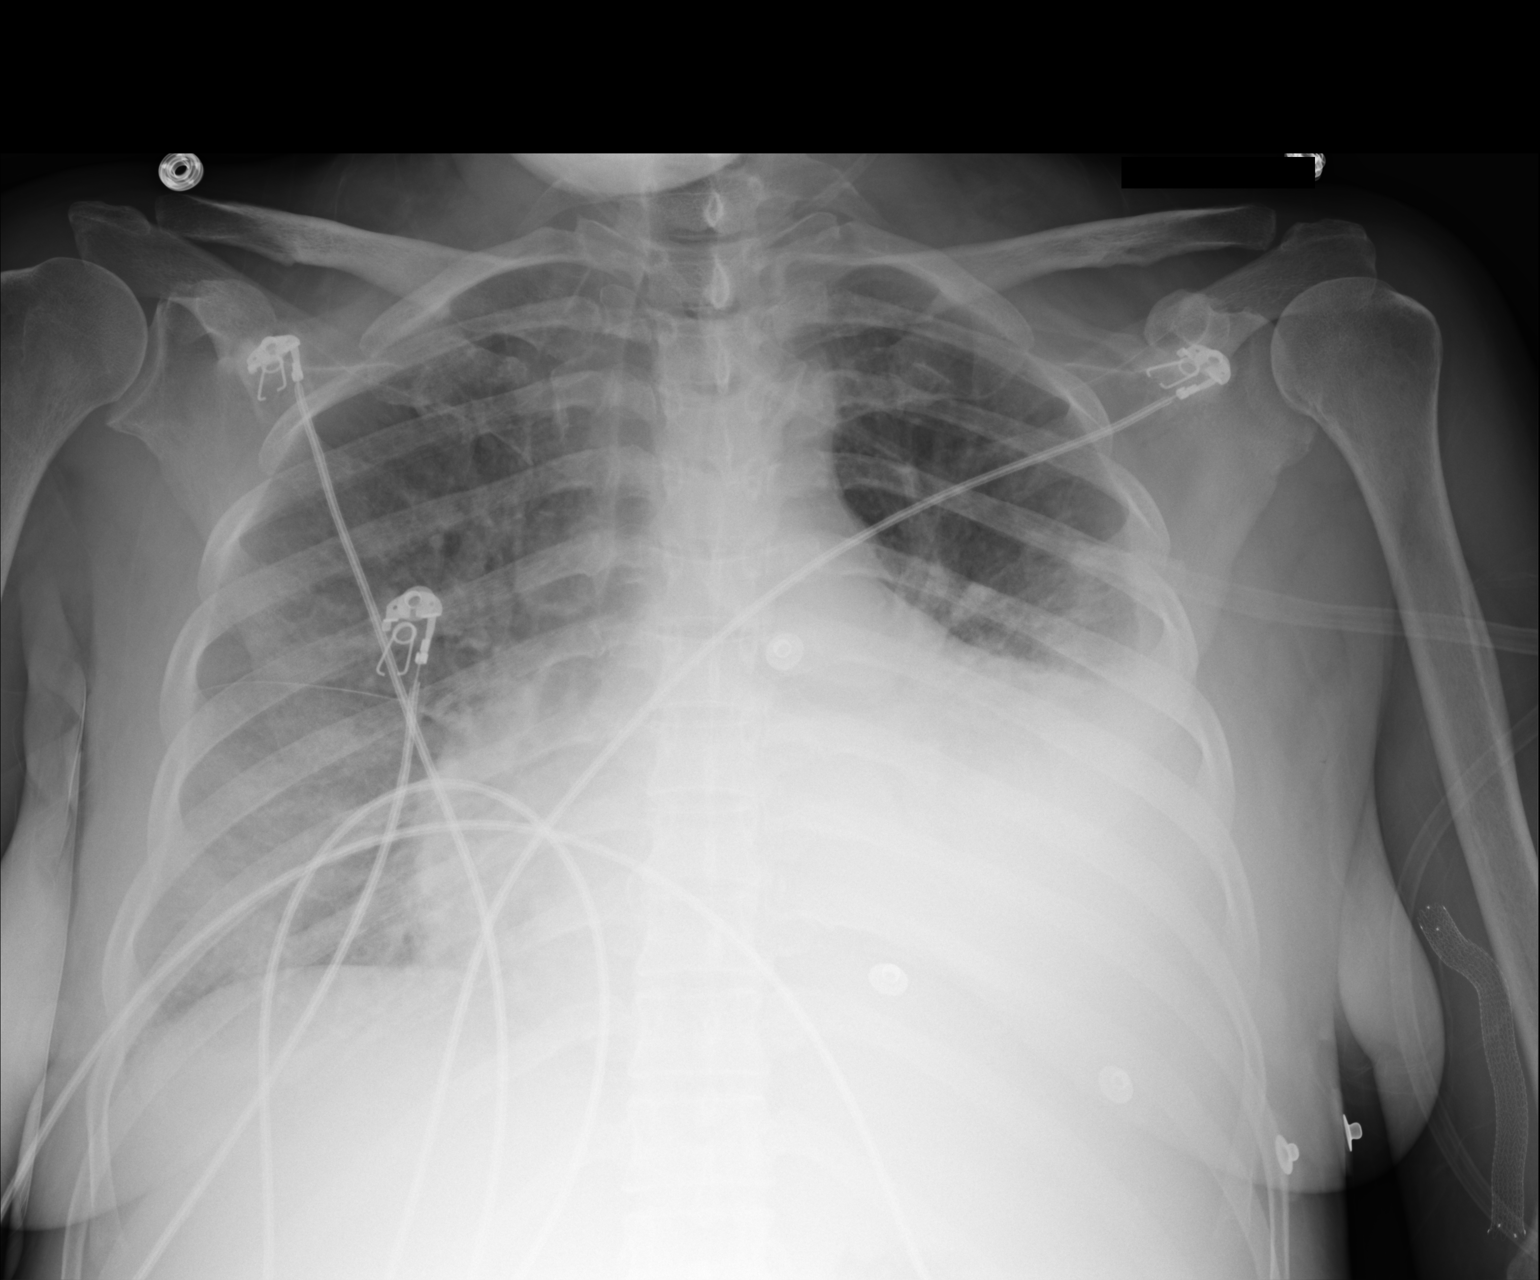

[1 of 1 positions shown; findings below may reference images not displayed]

FINDINGS: Cardiomegaly with vascular congestion. Left lower lobe atelectasis
or infiltrate with left effusion. Suspect mild pulmonary edema. No
effusion on the right.
IMPRESSION: Cardiomegaly with vascular congestion. Suspect mild pulmonary edema.

Probable small left pleural effusion with left lower lobe
atelectasis or infiltrate.

## 2017-10-30 IMAGING — DX DG CHEST 2V
2 series · 2 of 2 positions shown · non-contrast
Comparison: 07/21/2016

CLINICAL DATA: Pt reports cough x 3 days and she missed her
dialysis treatment; h/o DM, HTN, and TIA; former smoker

EXAM:
CHEST  2 VIEW

[chest pa]
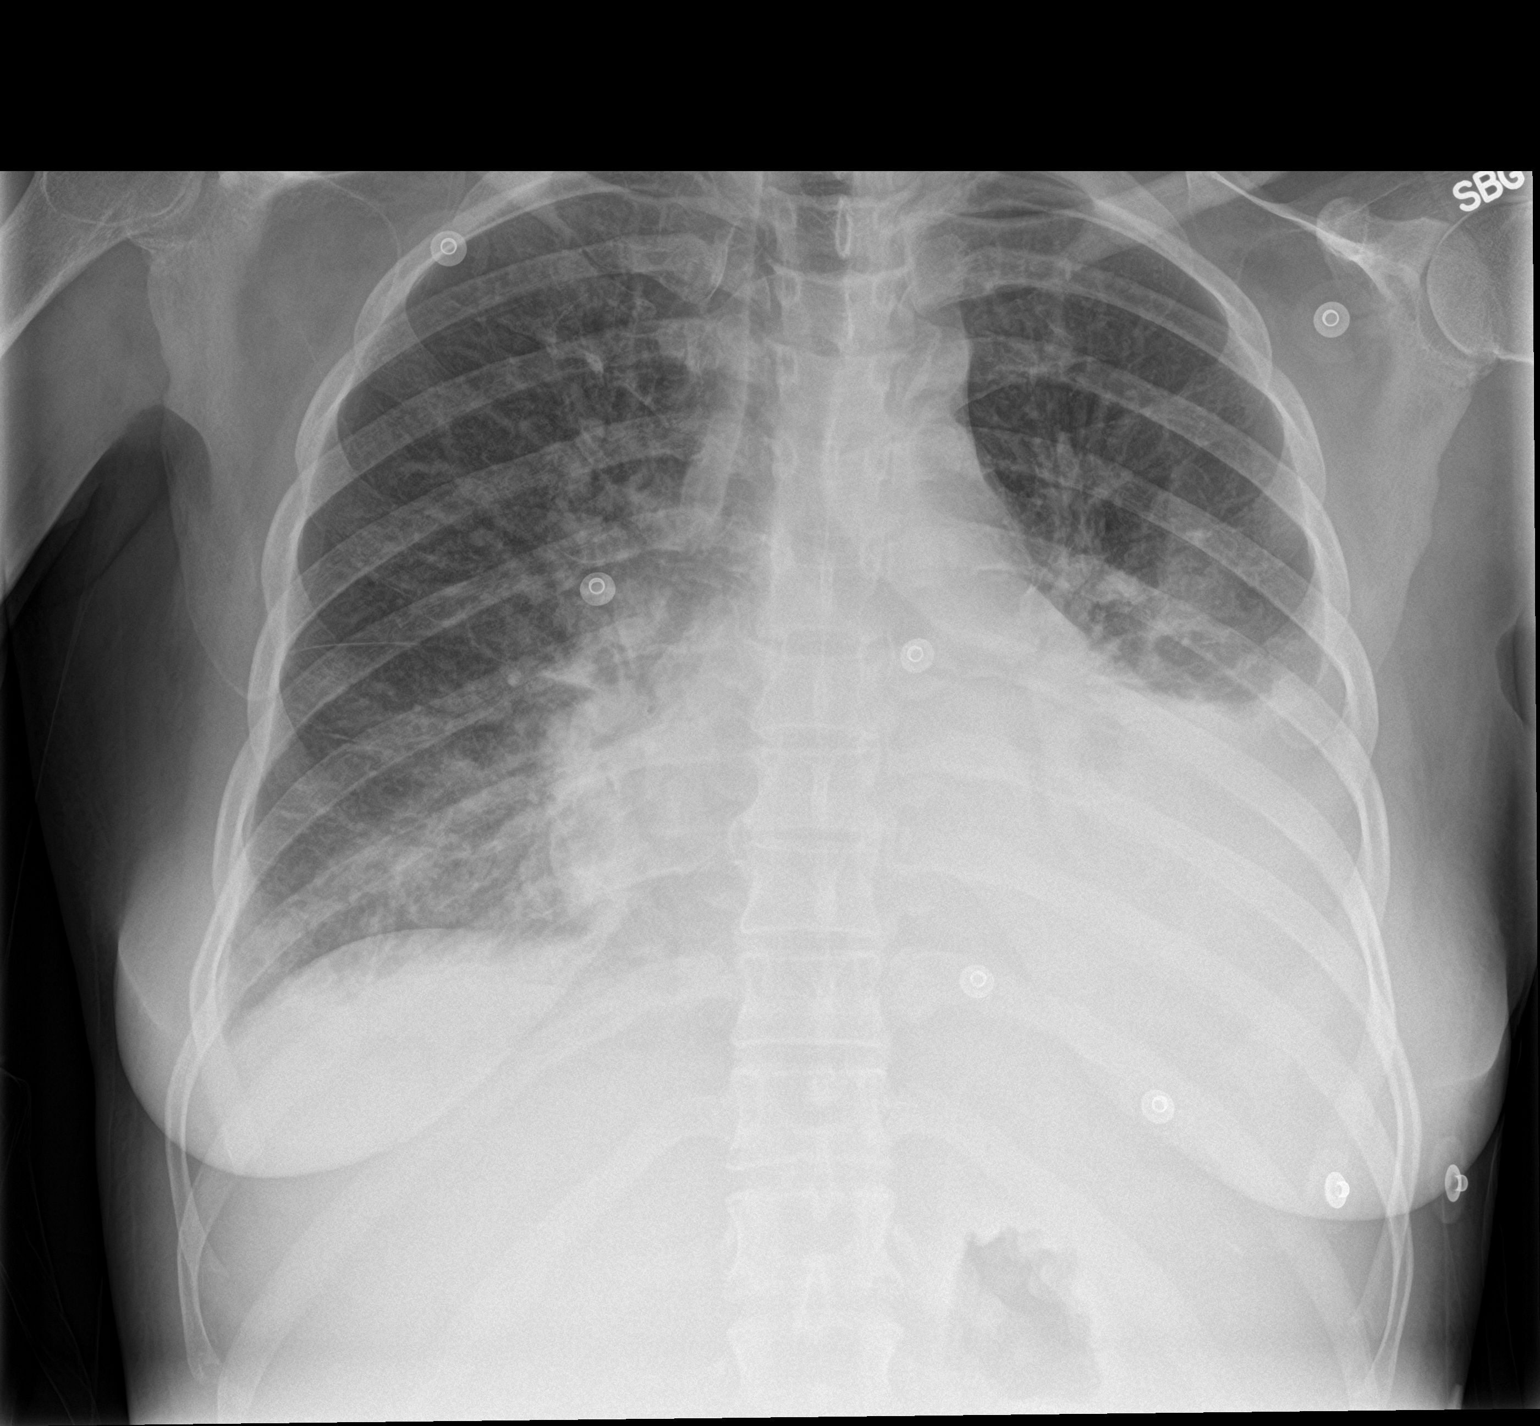

[chest lat]
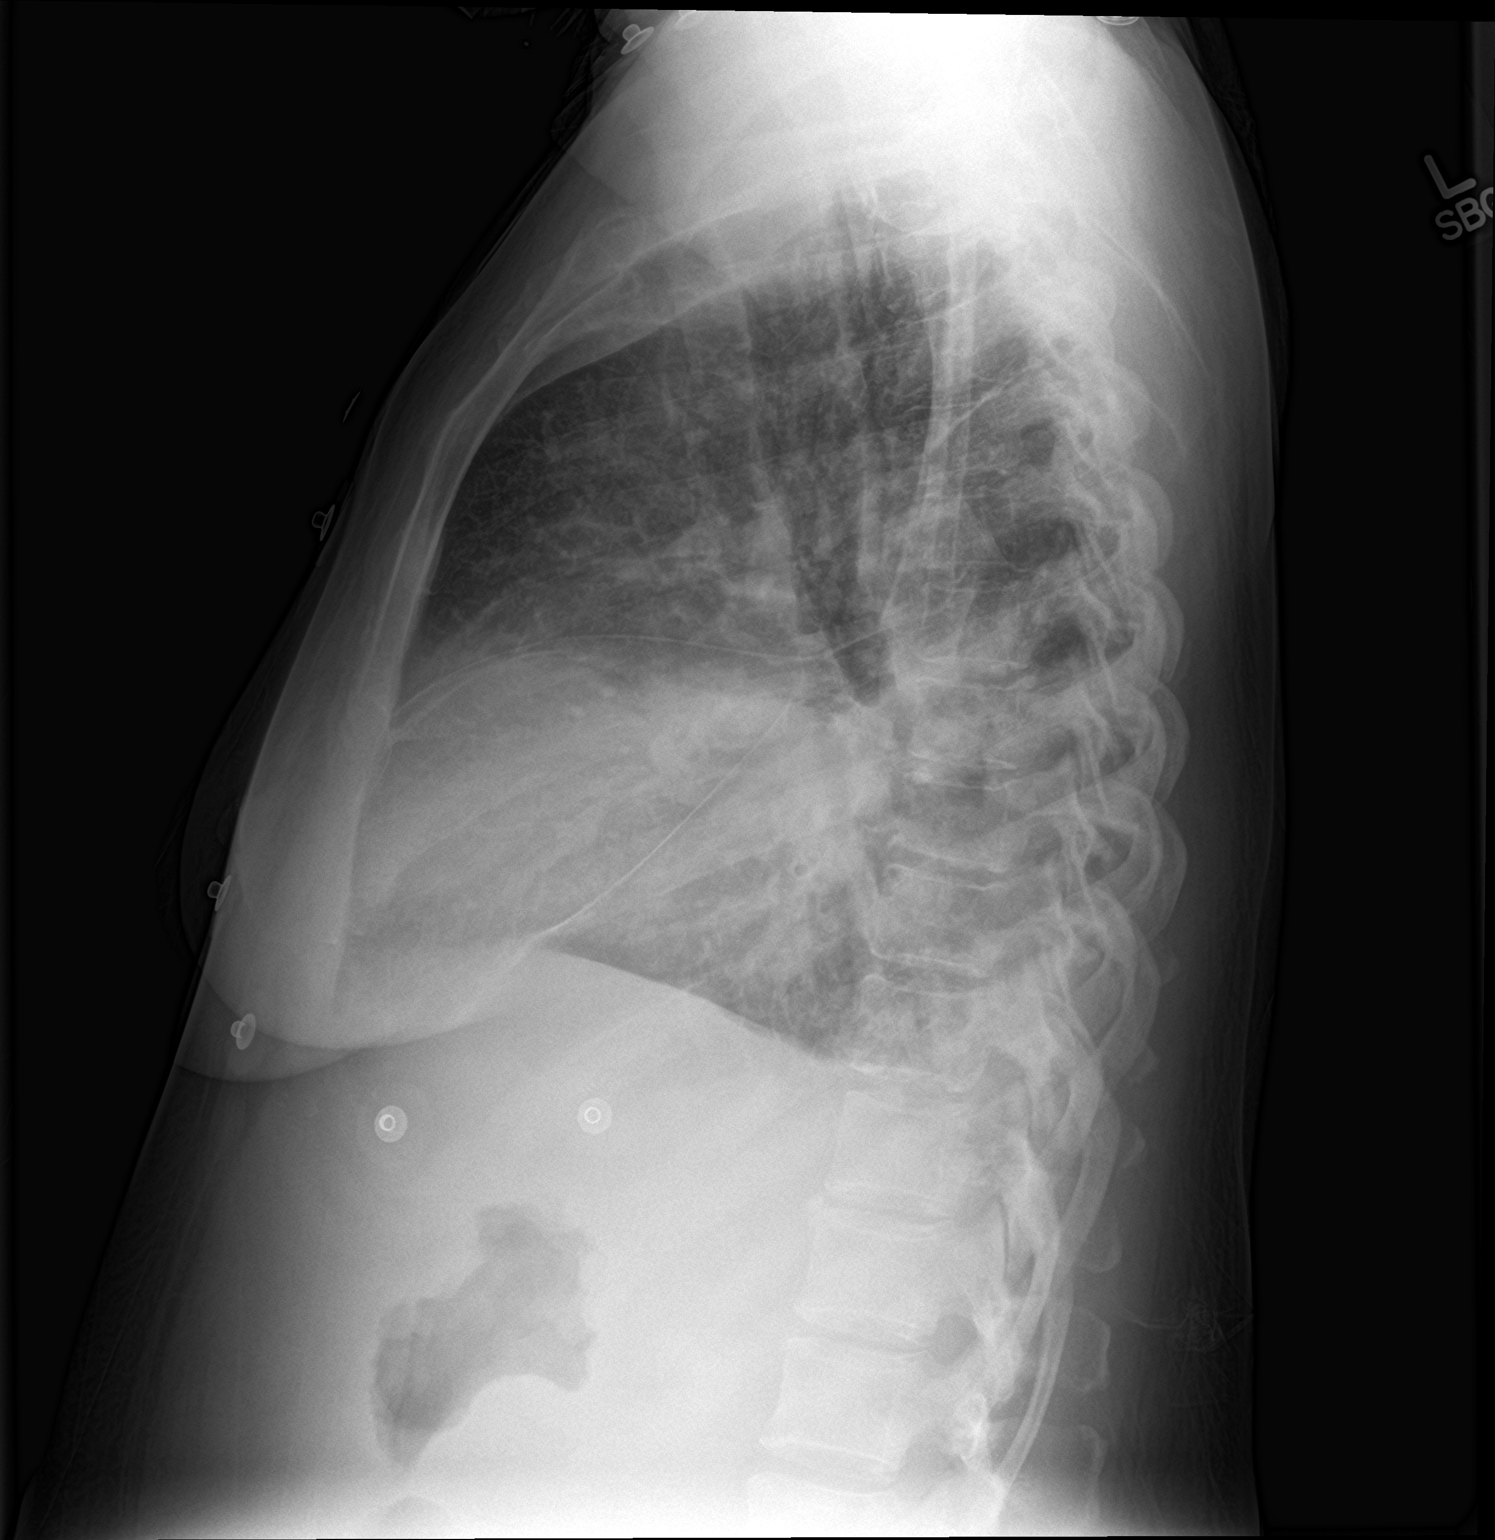

[2 of 2 positions shown; findings below may reference images not displayed]

FINDINGS: Heart size is enlarged. There are diffuse bilateral airspace filling
opacities with a perihilar distribution. Opacification at the left
lung base obscures the left hemidiaphragm. There are bilateral
pleural effusions.
IMPRESSION: 1. Findings consistent with CHF.
2. Consolidation at the left lung base is suspicious for infectious
process.

## 2017-11-11 IMAGING — US US ABDOMEN LIMITED
1 series · 14 of 25 positions shown · non-contrast
Comparison: Abdominal ultrasound May 10, 2015; CT abdomen and
pelvis May 12, 2015

CLINICAL DATA: Abdominal pain.  Hepatitis-C.

EXAM:
US ABDOMEN LIMITED - RIGHT UPPER QUADRANT

[Series 1: us abdomen limited · 0.15mm/px · 14 of 67 slices shown]
[im 1/67]
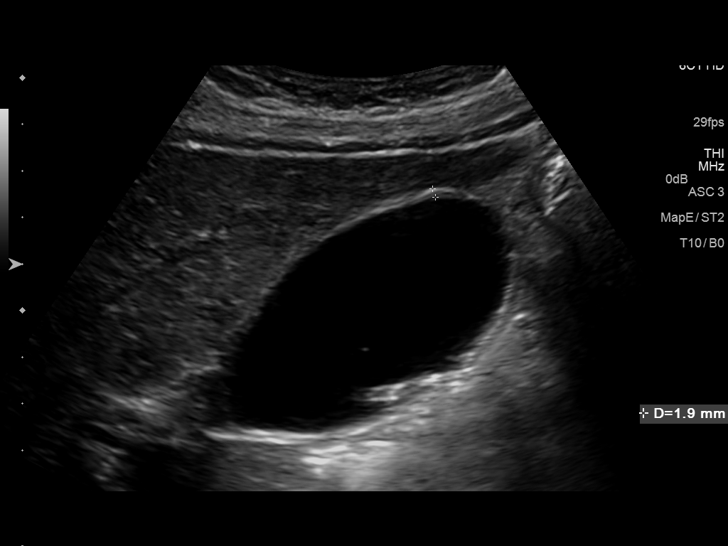
[im 6/67]
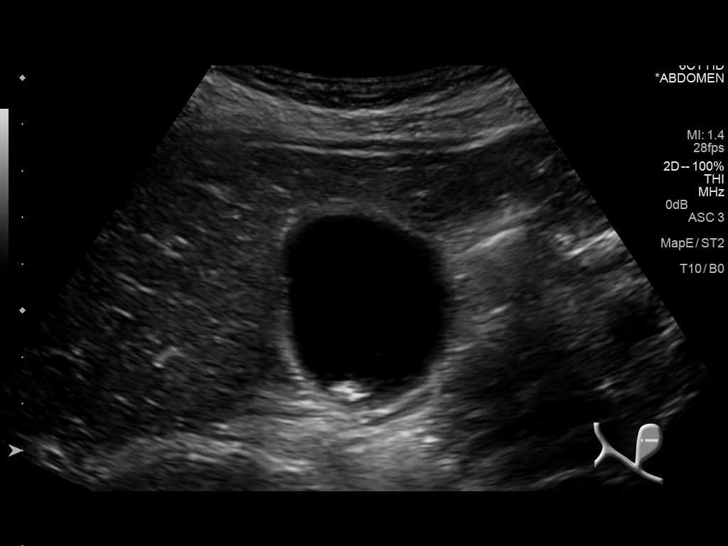
[im 12/67]
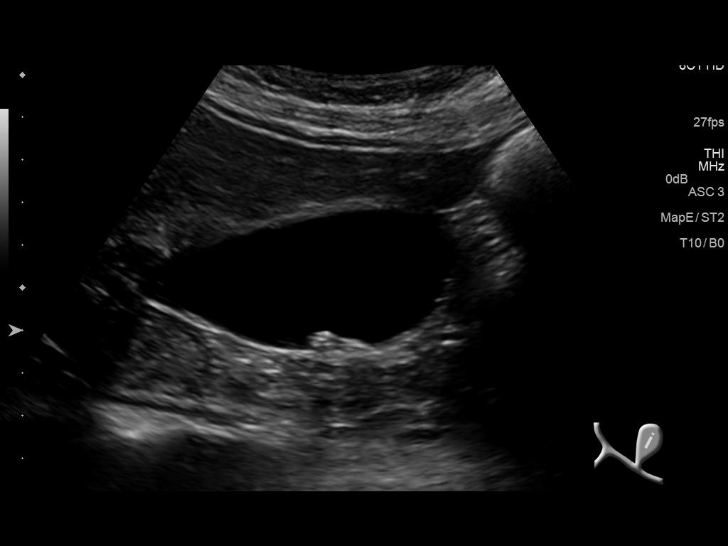
[im 17/67]
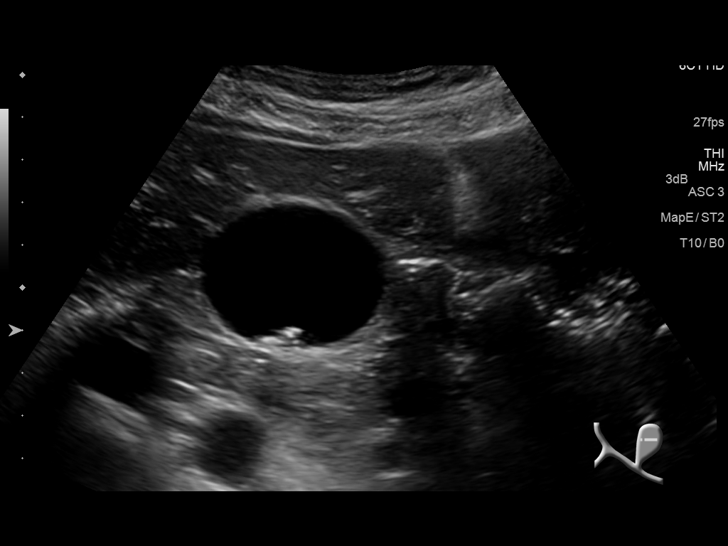
[im 23/67]
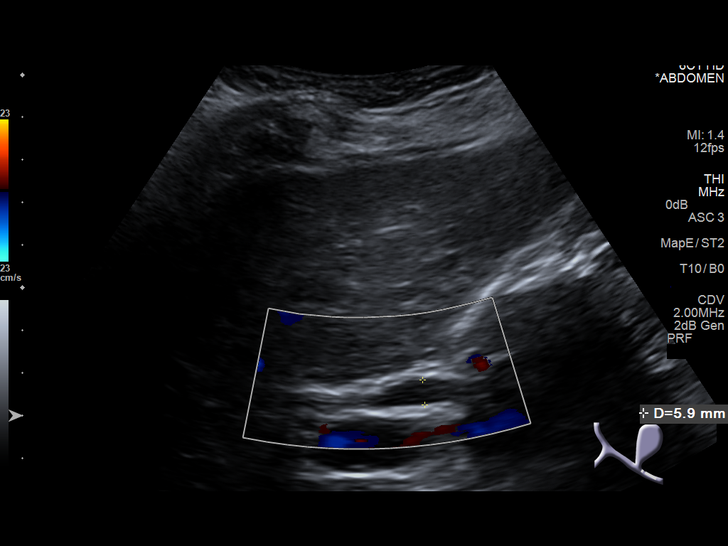
[im 25/67]
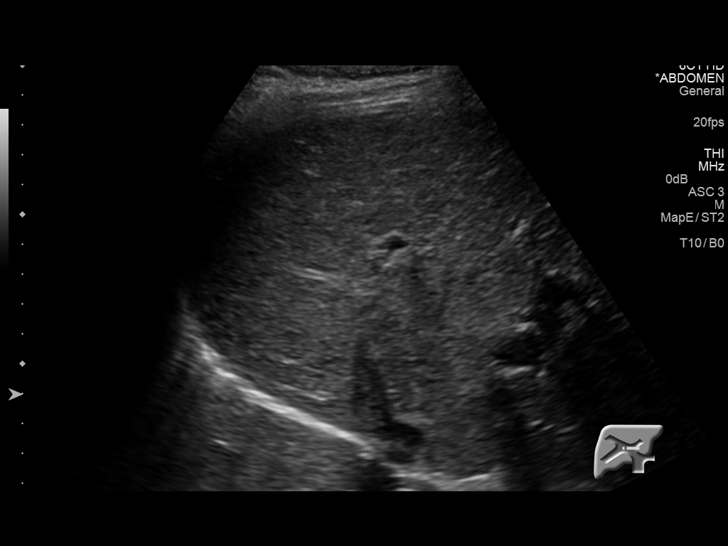
[im 31/67]
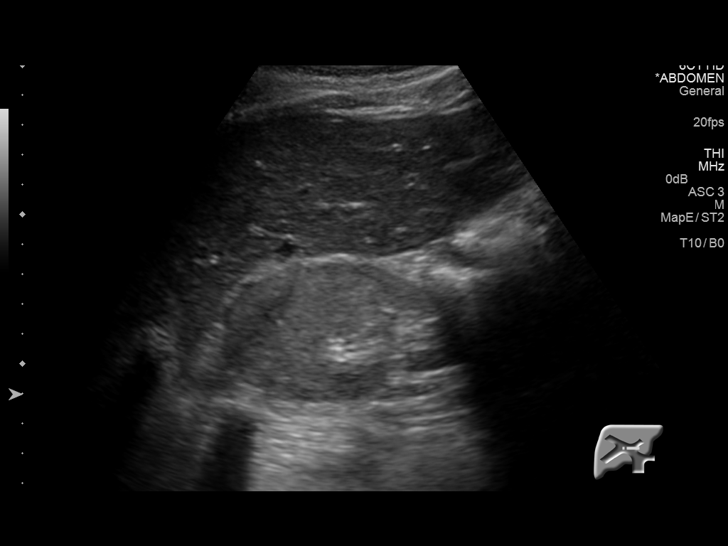
[im 36/67]
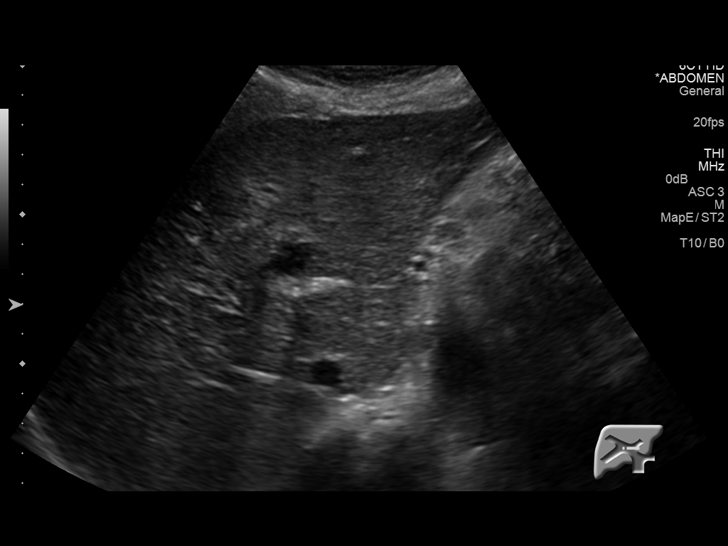
[im 42/67]
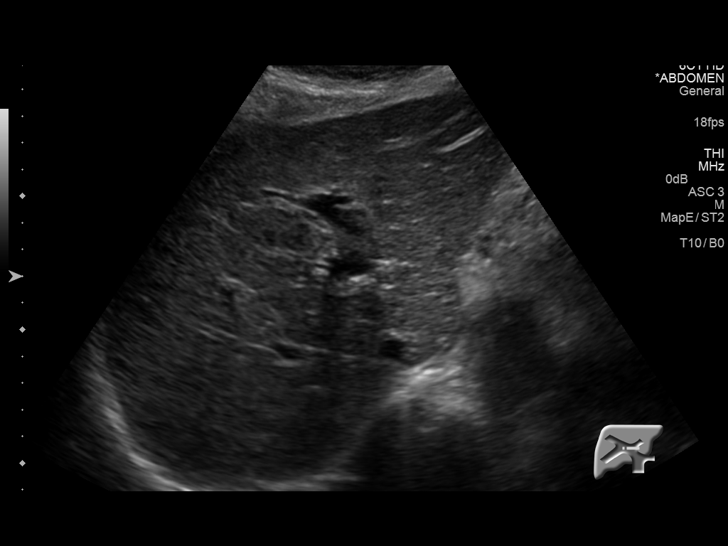
[im 45/67]
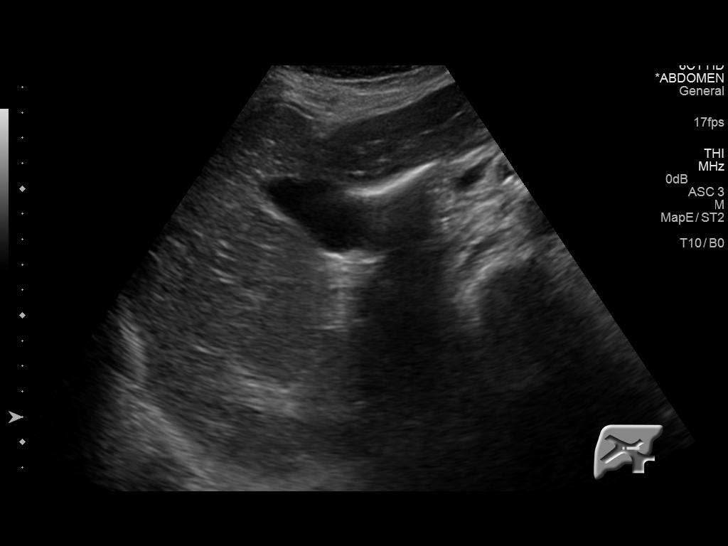
[im 50/67]
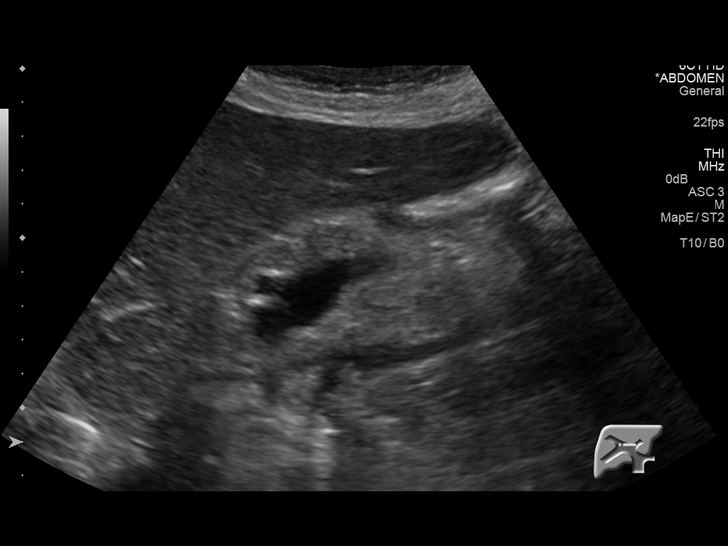
[im 56/67]
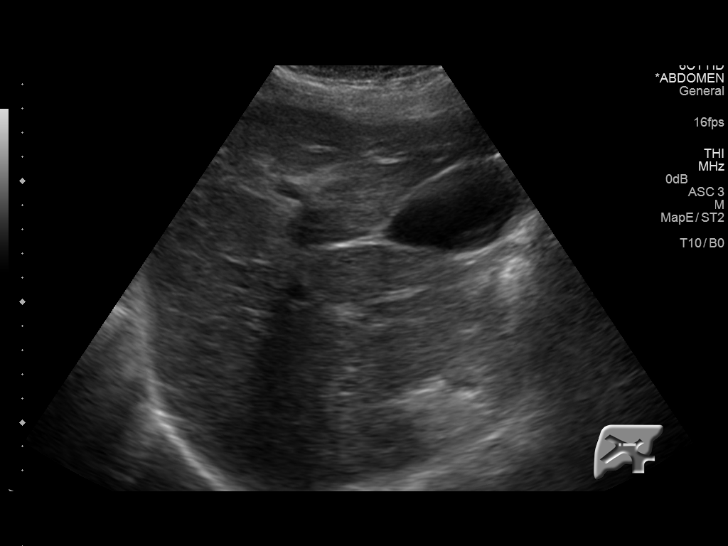
[im 61/67]
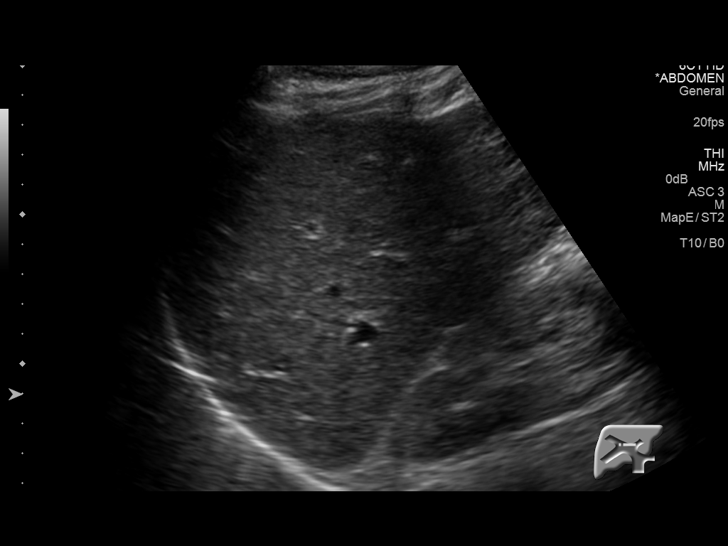
[im 67/67]
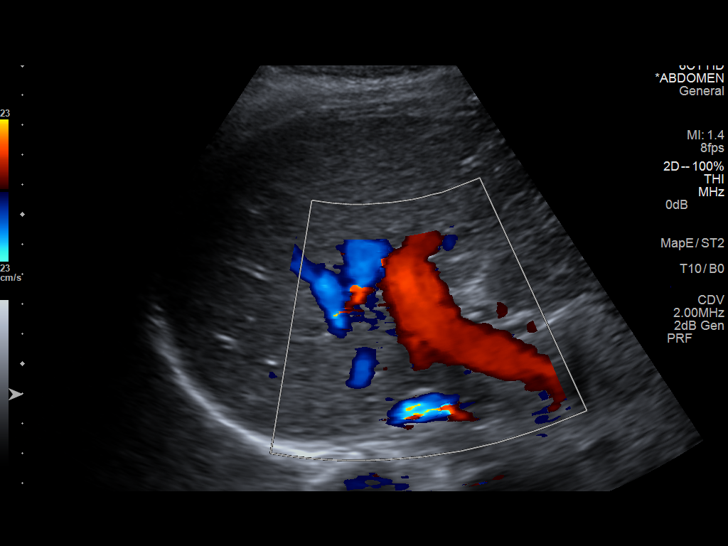

[14 of 25 positions shown; findings below may reference images not displayed]

FINDINGS: Gallbladder:

There is mild sludge within the gallbladder. There are several
echogenic foci which move and shadow consistent with gallstones.
Largest individual gallstone measures 10 mm in length. No
gallbladder wall thickening or pericholecystic fluid collection. No
sonographic Murphy sign noted.

Common bile duct:

Diameter: 6 mm. No intrahepatic or extrahepatic biliary duct
dilatation.

Liver:

No focal lesion identified. Liver echogenicity is rather coarse, a
finding noted previously.
IMPRESSION: Cholelithiasis and mild sludge in the gallbladder. Gallbladder wall
is not thickened, and there is no pericholecystic fluid collection.

The liver echogenicity is somewhat coarse, a finding that may be
seen with underlying parenchymal disease such as hepatitis. No focal
liver lesions are identified.

No biliary duct dilatation appreciable.

## 2017-11-24 IMAGING — CT CT HEAD W/O CM
3 series · 15 of 47 positions shown, 18 images · non-contrast
Comparison: Brain MRI 11/22/2014.  Head CT 11/22/2014.

CLINICAL DATA: Visual changes in the right eye starting yesterday.
Headache.

EXAM:
CT HEAD WITHOUT CONTRAST
TECHNIQUE: Contiguous axial images were obtained from the base of the skull
through the vertex without intravenous contrast.

[Series 2: head 5.0 h30s · axial · 0.43mm/px · z∈[-99,+36]mm · 9 of 33 slices shown, 12 images]
[im 3/33  brain]
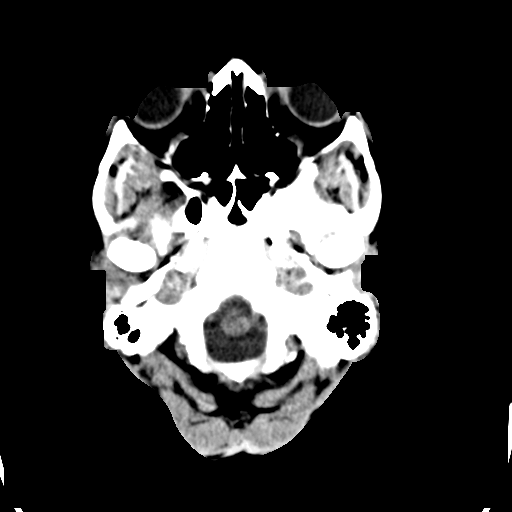
[im 3/33  bone]
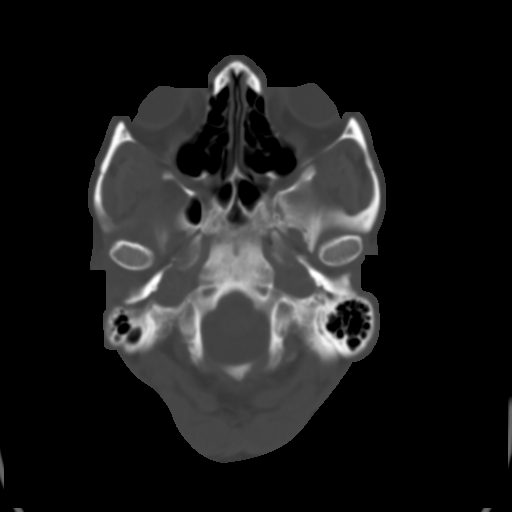
[im 6/33  brain]
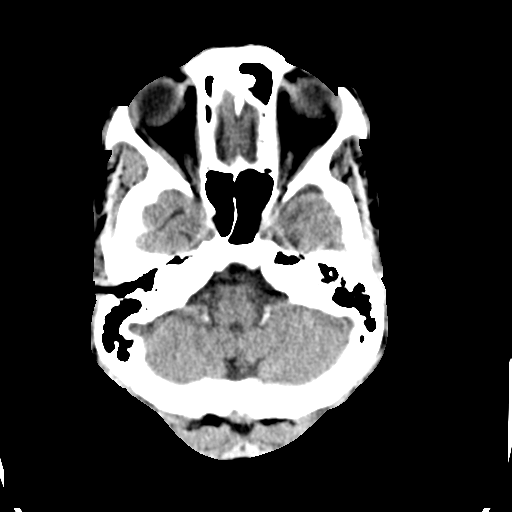
[im 9/33  brain]
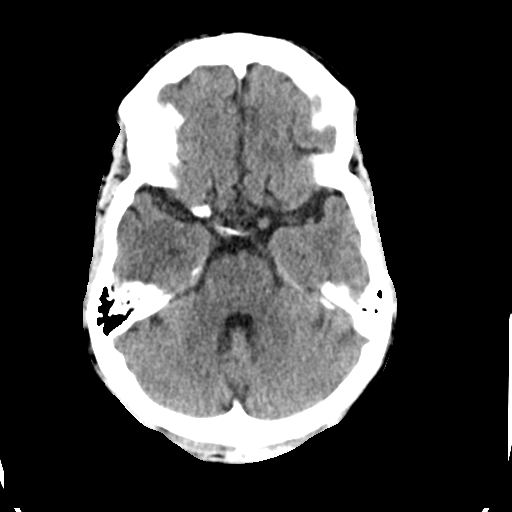
[im 13/33  brain]
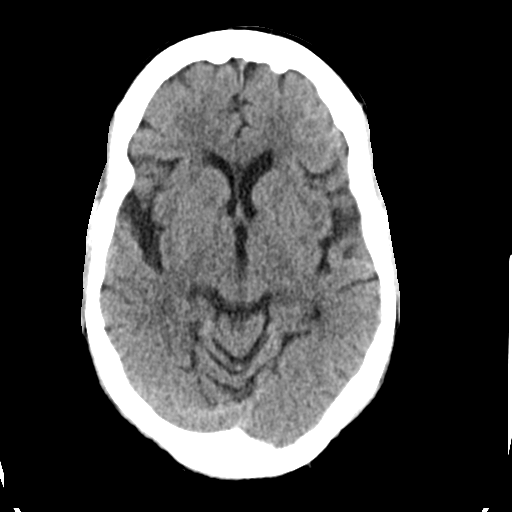
[im 17/33  brain]
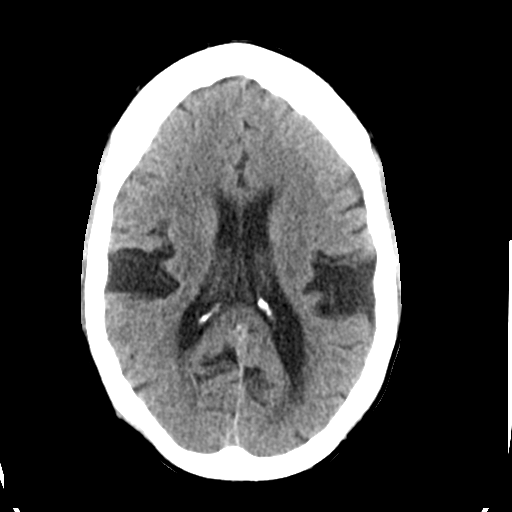
[im 17/33  bone]
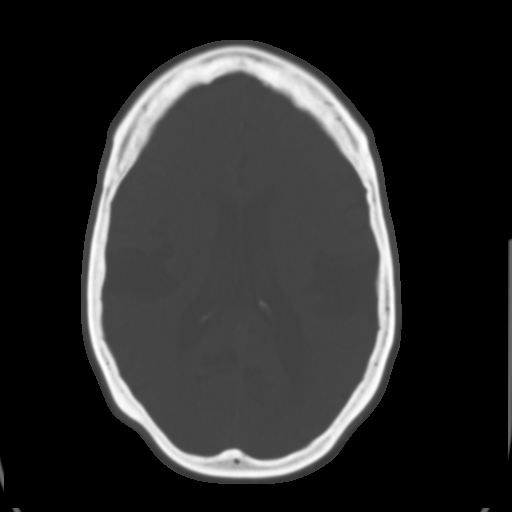
[im 20/33  brain]
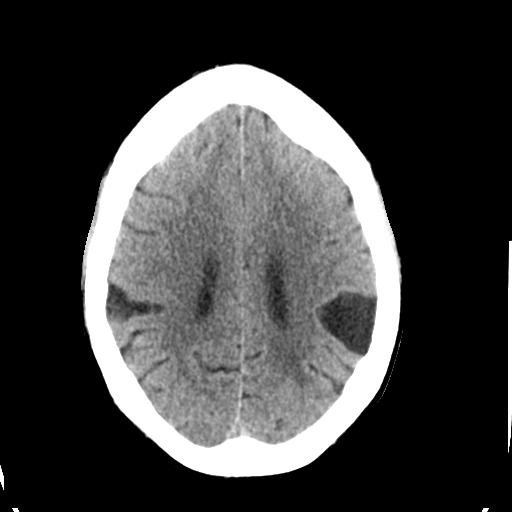
[im 24/33  brain]
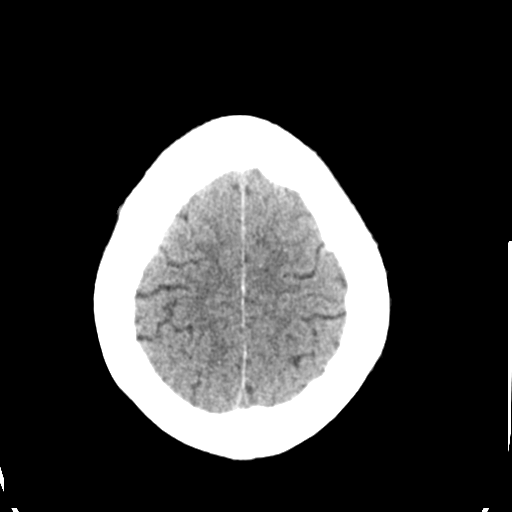
[im 27/33  brain]
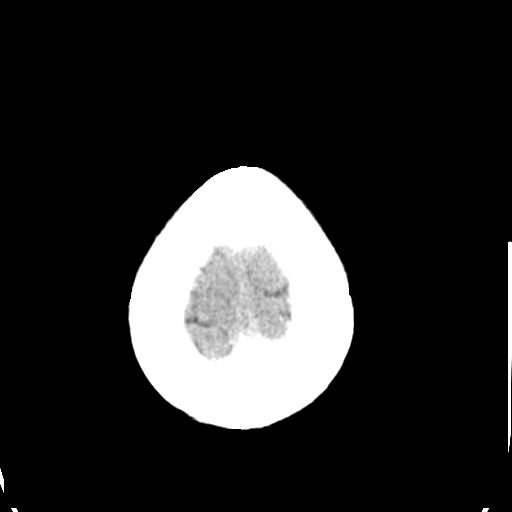
[im 30/33  brain]
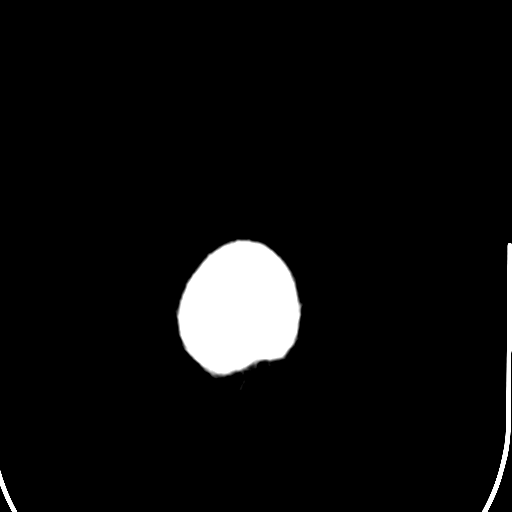
[im 30/33  bone]
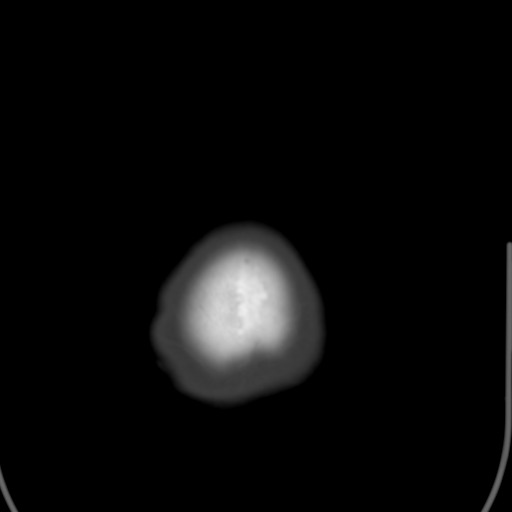

[Series 4: head 3.0 mpr cor · coronal · 0.30mm/px · 3 of 69 slices shown]
[im 23/69  brain]
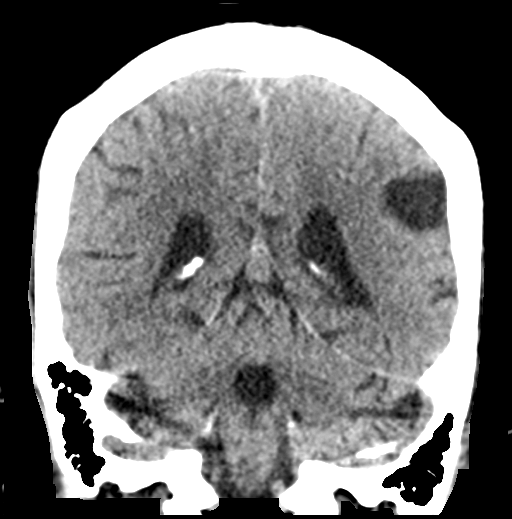
[im 31/69  brain]
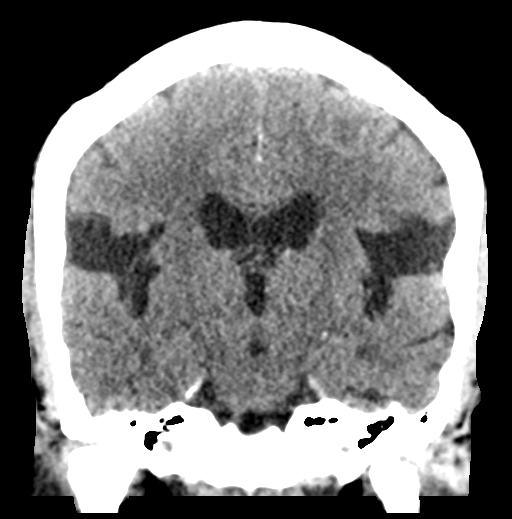
[im 38/69  brain]
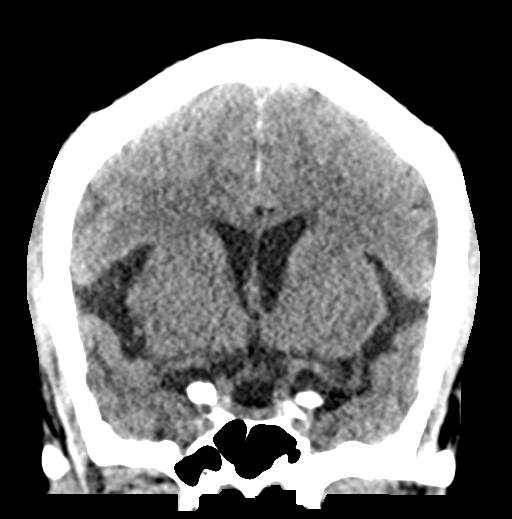

[Series 5: head 3.0 mpr sag · sagittal · 0.31mm/px · 3 of 49 slices shown]
[im 17/49  brain]
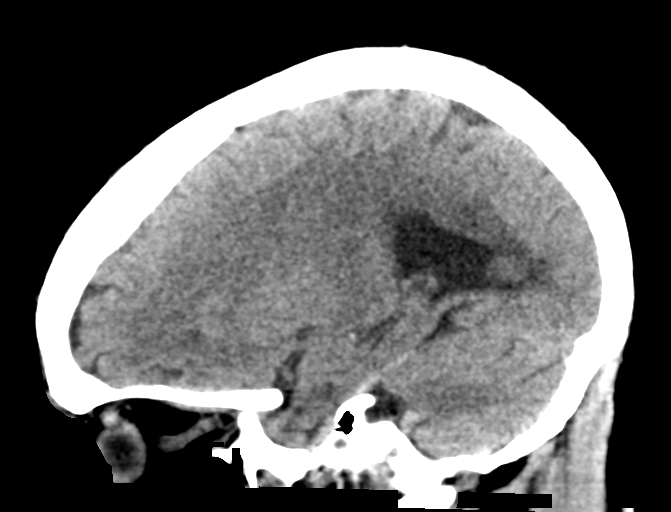
[im 25/49  brain]
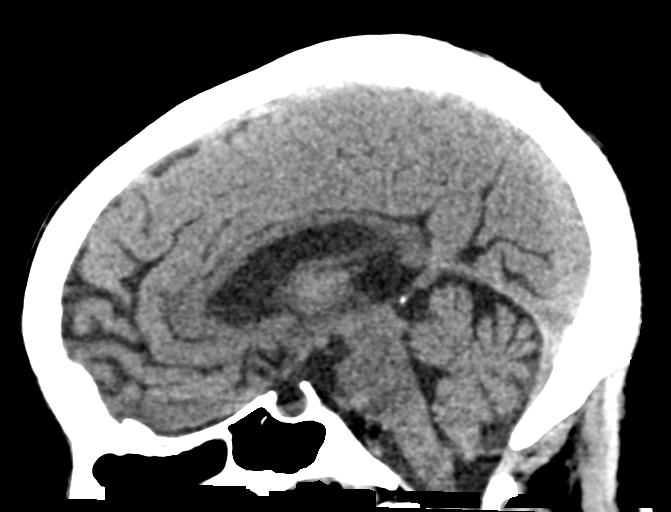
[im 33/49  brain]
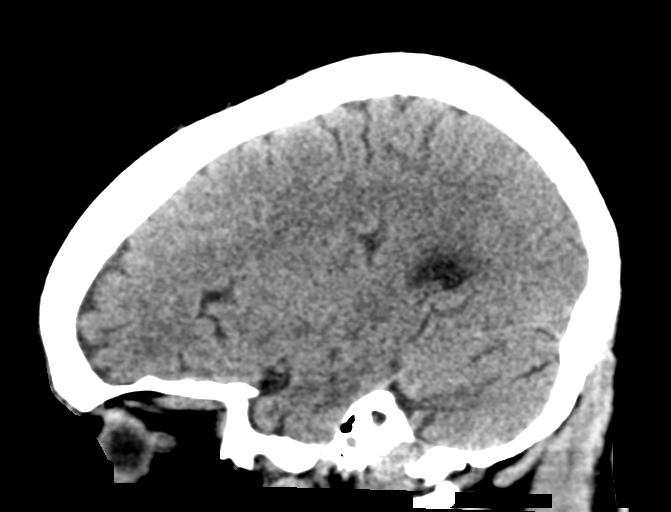

[15 of 47 positions shown; findings below may reference images not displayed]

FINDINGS: Brain: There is no evidence for acute hemorrhage, hydrocephalus,
mass lesion, or abnormal extra-axial fluid collection. No definite
CT evidence for acute infarction. Diffuse loss of parenchymal volume
is consistent with atrophy. Symmetric enlargement of the sylvian
fissure bilaterally is stable and compatible with the underlying
atrophy. Encephalomalacia in the medial left occipital lobe is
unchanged. Lacunar infarct or benign cyst identified left basal
ganglia. Patchy low attenuation in the deep hemispheric and
periventricular white matter is nonspecific, but likely reflects
chronic microvascular ischemic demyelination.

Vascular: Atherosclerotic calcification is visualized in the carotid
arteries. No dense MCA sign. Major dural sinuses are unremarkable.

Skull: No evidence for fracture. No worrisome lytic or sclerotic
lesion.

Sinuses/Orbits: The visualized paranasal sinuses and mastoid air
cells are clear. Visualized portions of the globes and intraorbital
fat are unremarkable.

Other: None.
IMPRESSION: 1. Stable.  No acute intracranial abnormality.
2. Atrophy with chronic small vessel white matter ischemic disease.

## 2017-12-05 IMAGING — CR DG CHEST 2V
2 series · 2 of 2 positions shown · non-contrast
Comparison: 07/21/2016

CLINICAL DATA: Dyspnea

EXAM:
CHEST  2 VIEW

[chest pa]
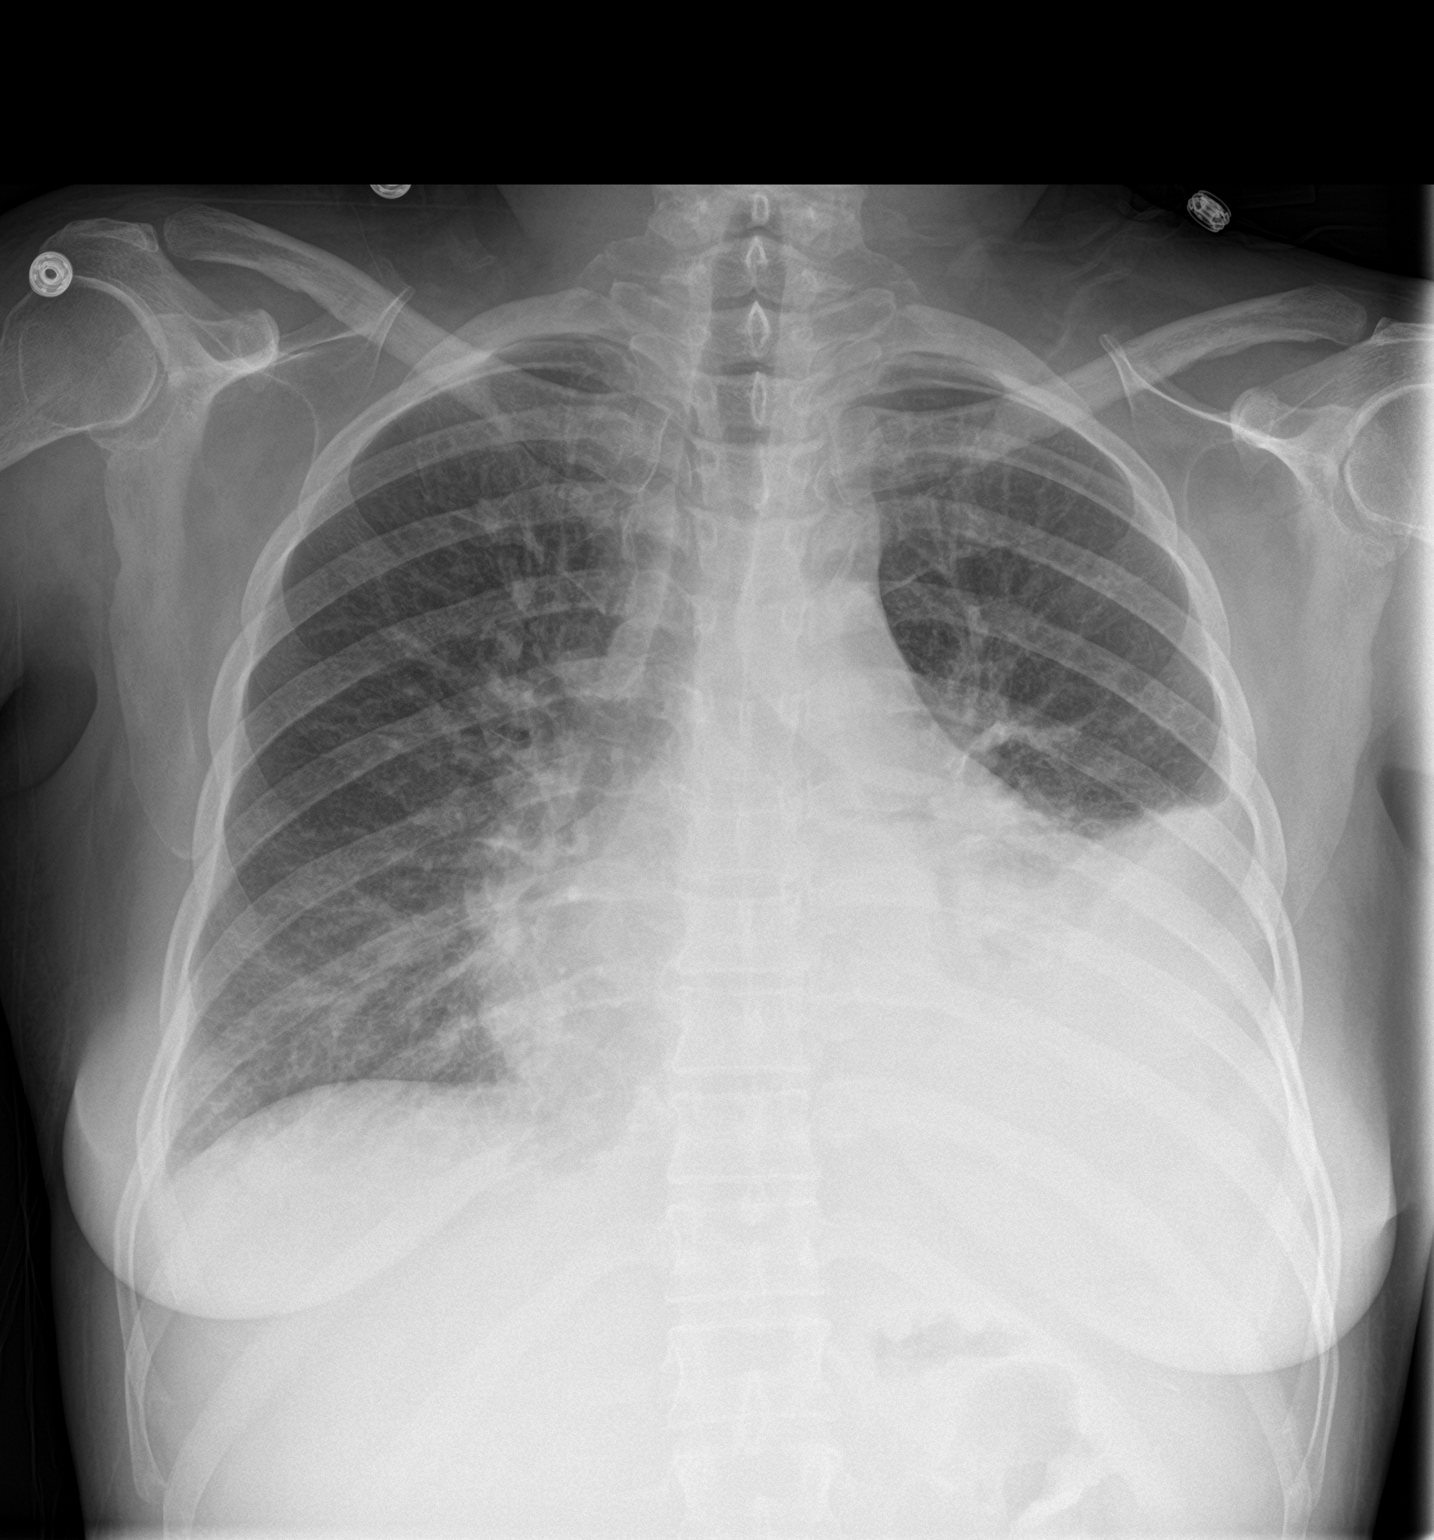

[chest lat]
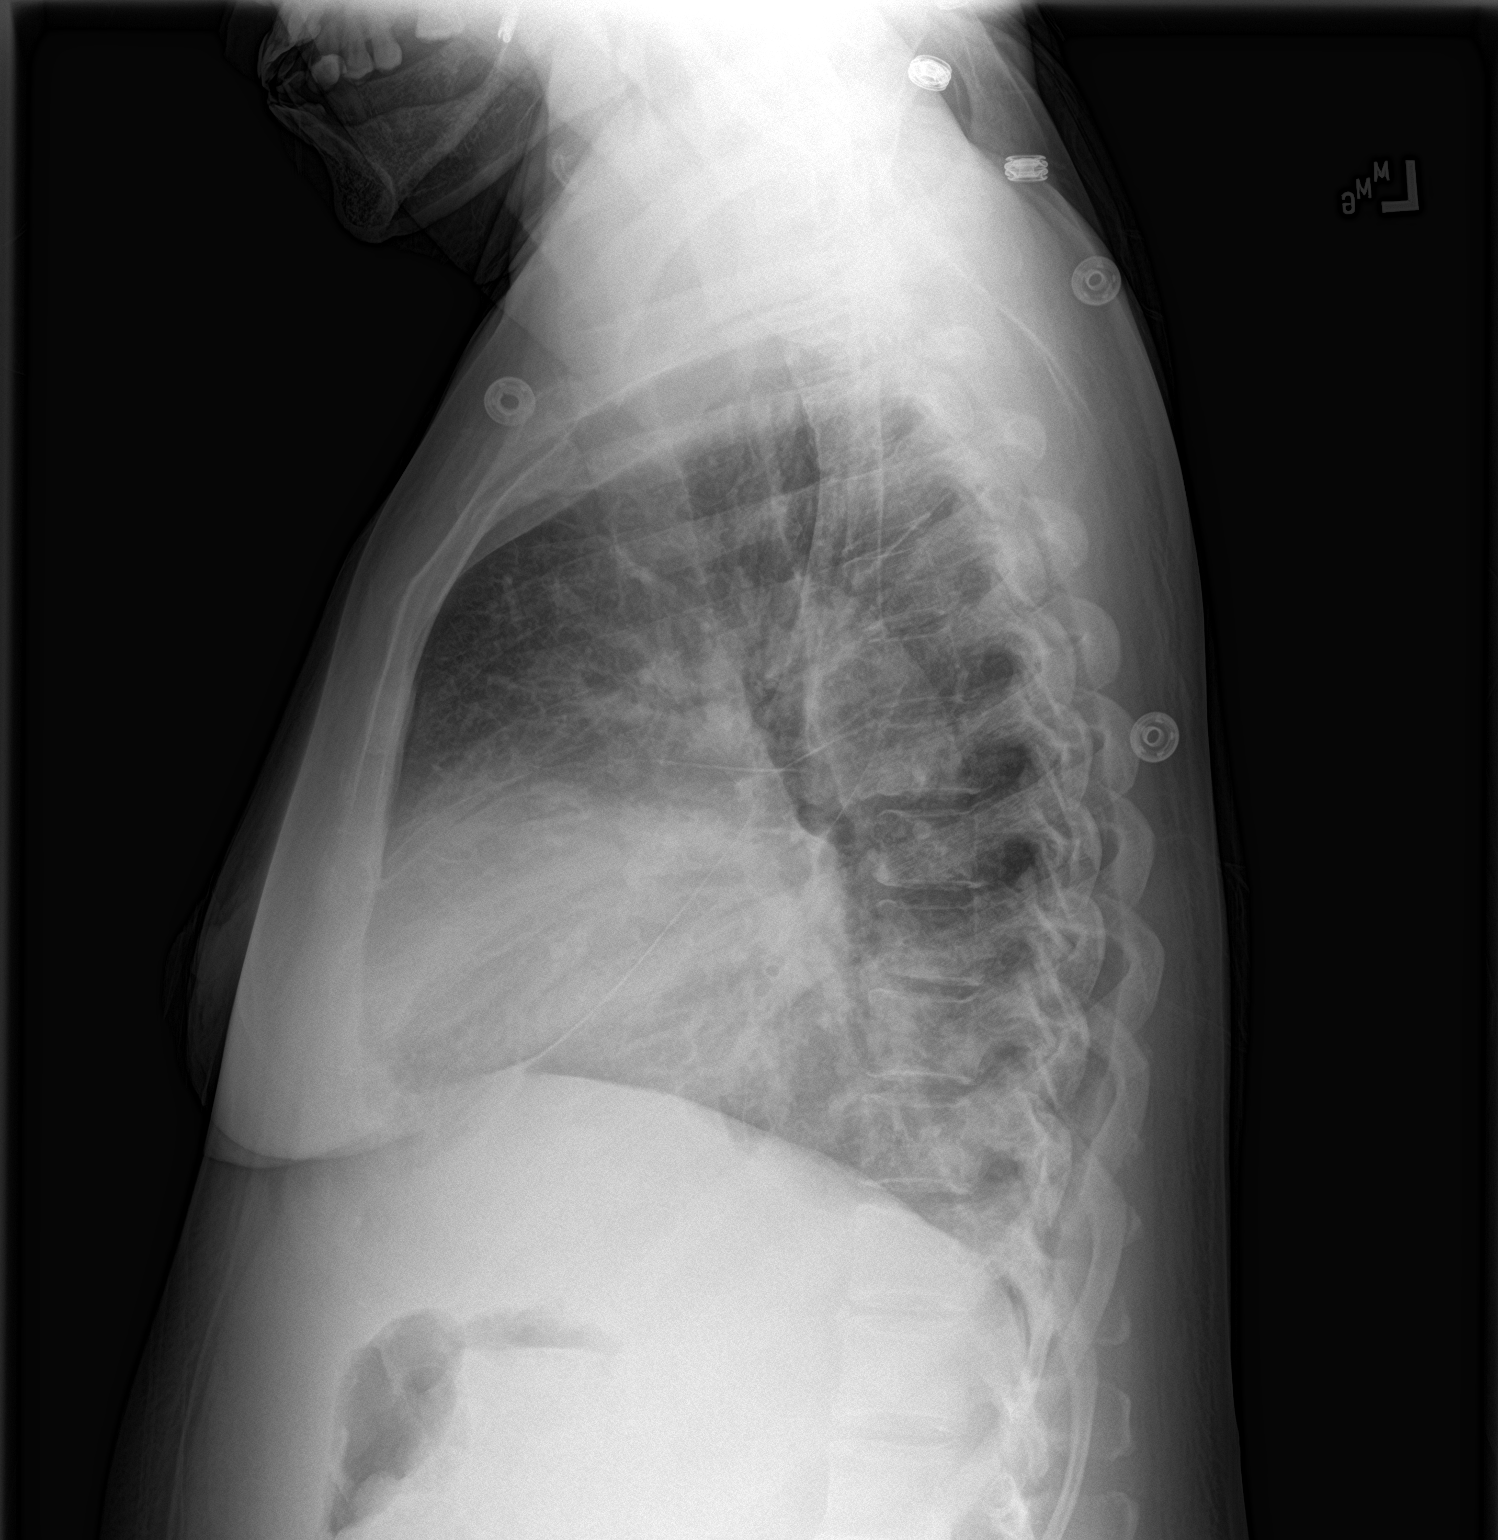

[2 of 2 positions shown; findings below may reference images not displayed]

FINDINGS: Stable cardiomegaly with vascular congestion. No aortic aneurysm. No
suspicious osseous lesions. Chronic opacity at the left lung base
consistent left effusion and probable compressive atelectasis or
pulmonary consolidation.
IMPRESSION: Cardiomegaly with mild CHF. Chronic consolidation at the left lung
base with probable left effusion.

## 2017-12-06 IMAGING — CR DG CHEST 2V
2 series · 2 of 2 positions shown · non-contrast
Comparison: Radiographs August 26, 2016.

CLINICAL DATA: Shortness of breath.

EXAM:
CHEST  2 VIEW

[chest pa]
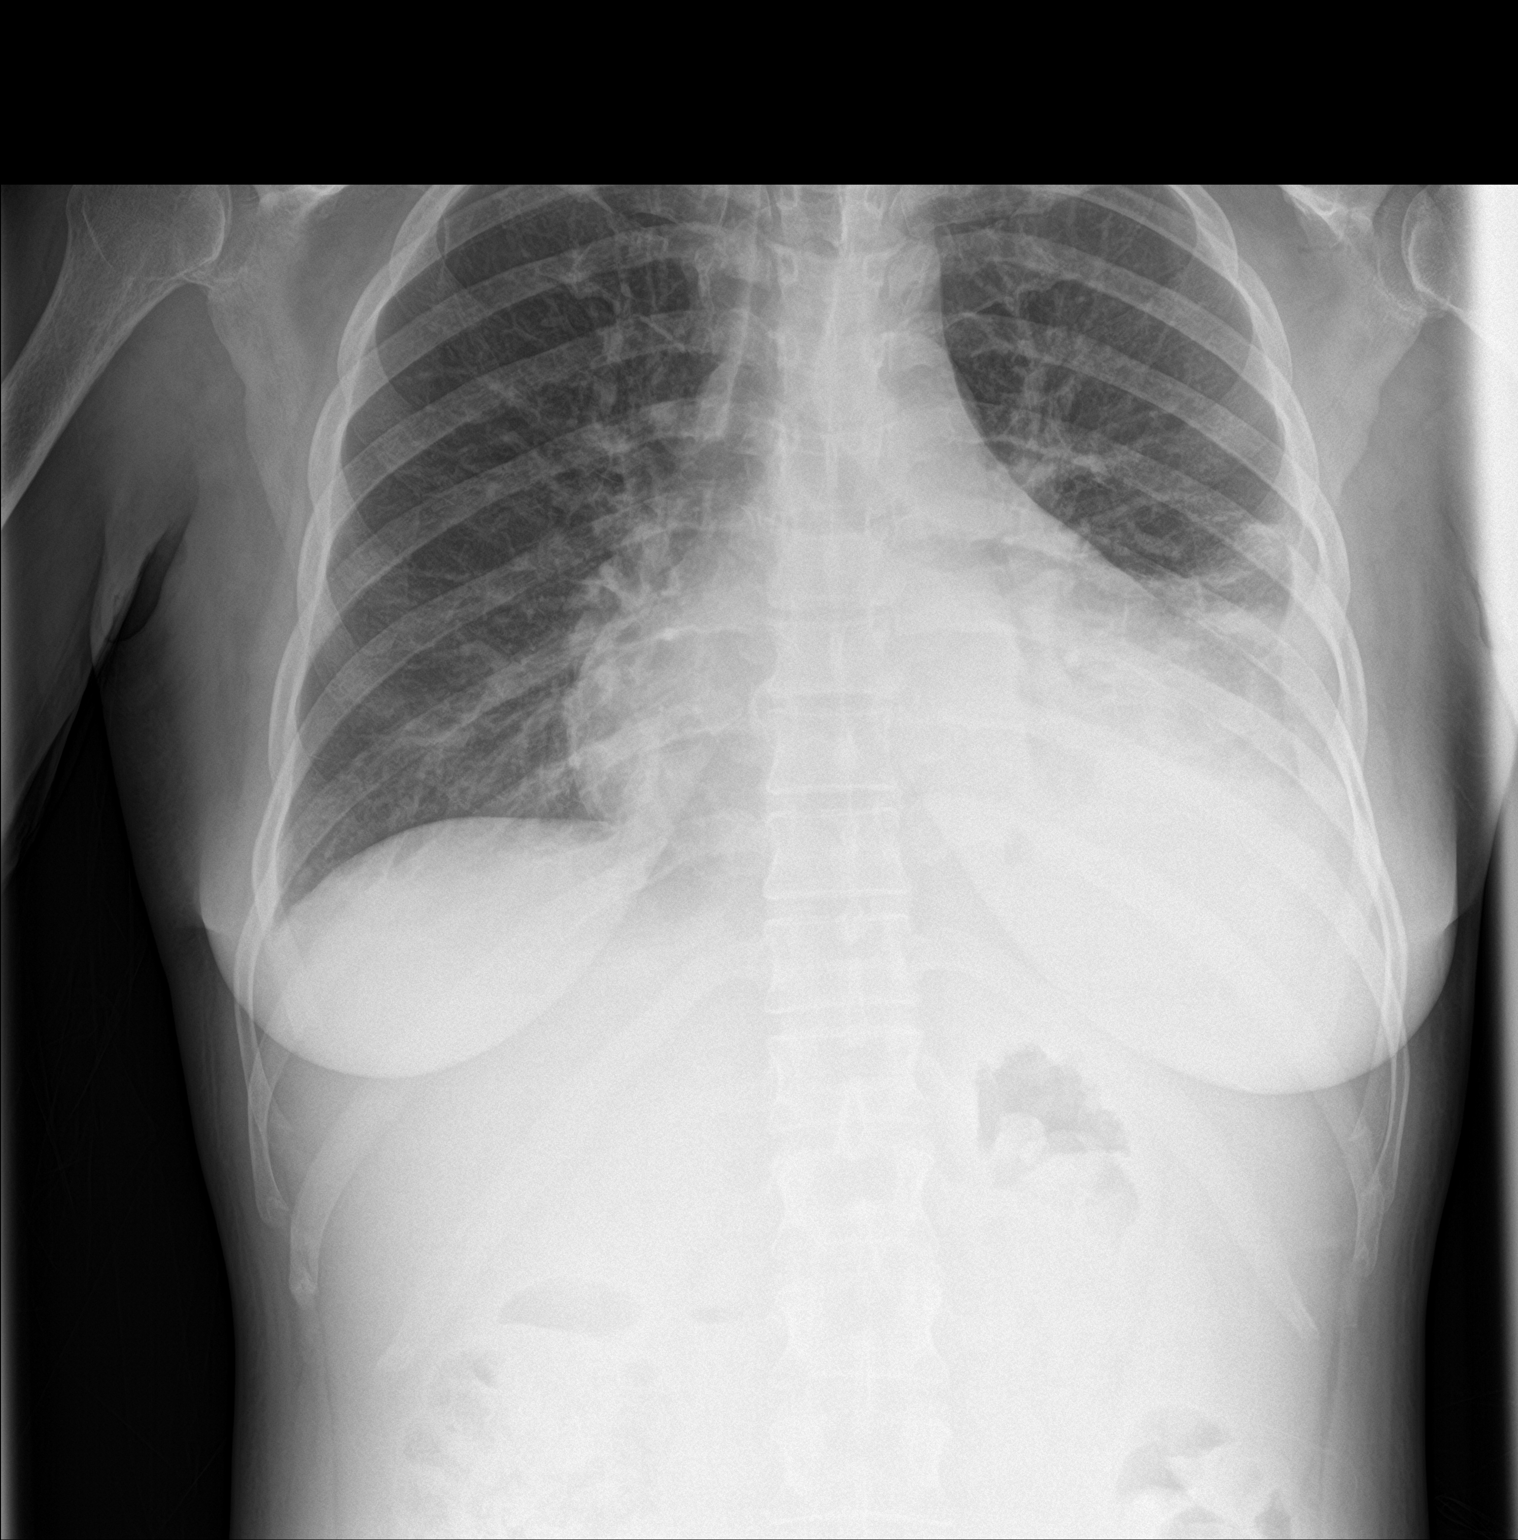

[chest lat]
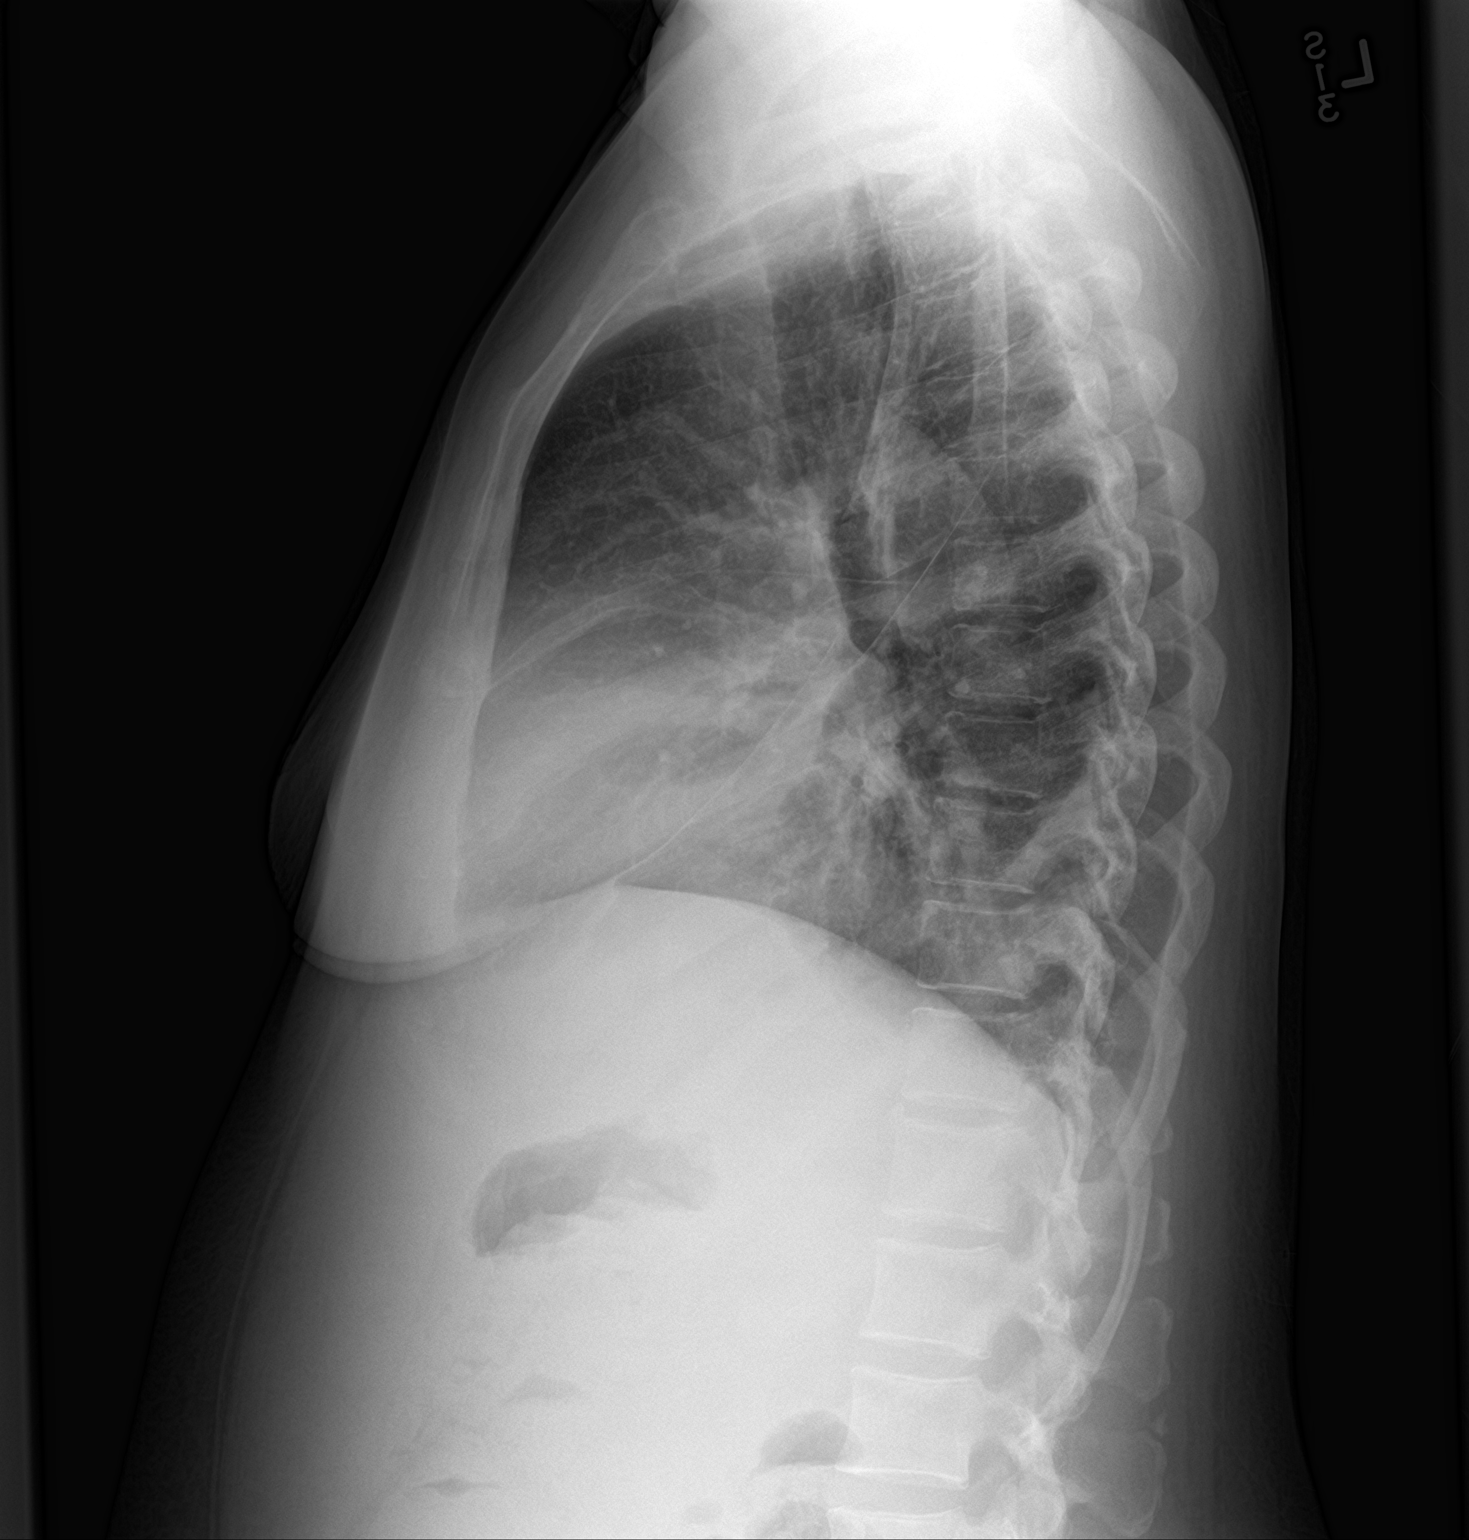

[2 of 2 positions shown; findings below may reference images not displayed]

FINDINGS: Stable cardiomegaly. No pneumothorax is noted. Stable mild central
pulmonary vascular congestion. Right lung is clear. Stable left
basilar opacity is noted concerning for atelectasis or infiltrate
with associated pleural effusion. Bony thorax is unremarkable.
IMPRESSION: Stable left basilar atelectasis or infiltrate with associated
pleural effusion.

## 2017-12-09 ENCOUNTER — Encounter: Payer: Self-pay | Admitting: Nephrology

## 2018-01-24 IMAGING — CR DG CHEST 2V
2 series · 2 of 2 positions shown · non-contrast
Comparison: Chest radiograph August 27, 2016

CLINICAL DATA: Productive cough for 3 days. History of diabetes,
CHF and end-stage renal disease on dialysis.

EXAM:
CHEST  2 VIEW

[w chest pa]
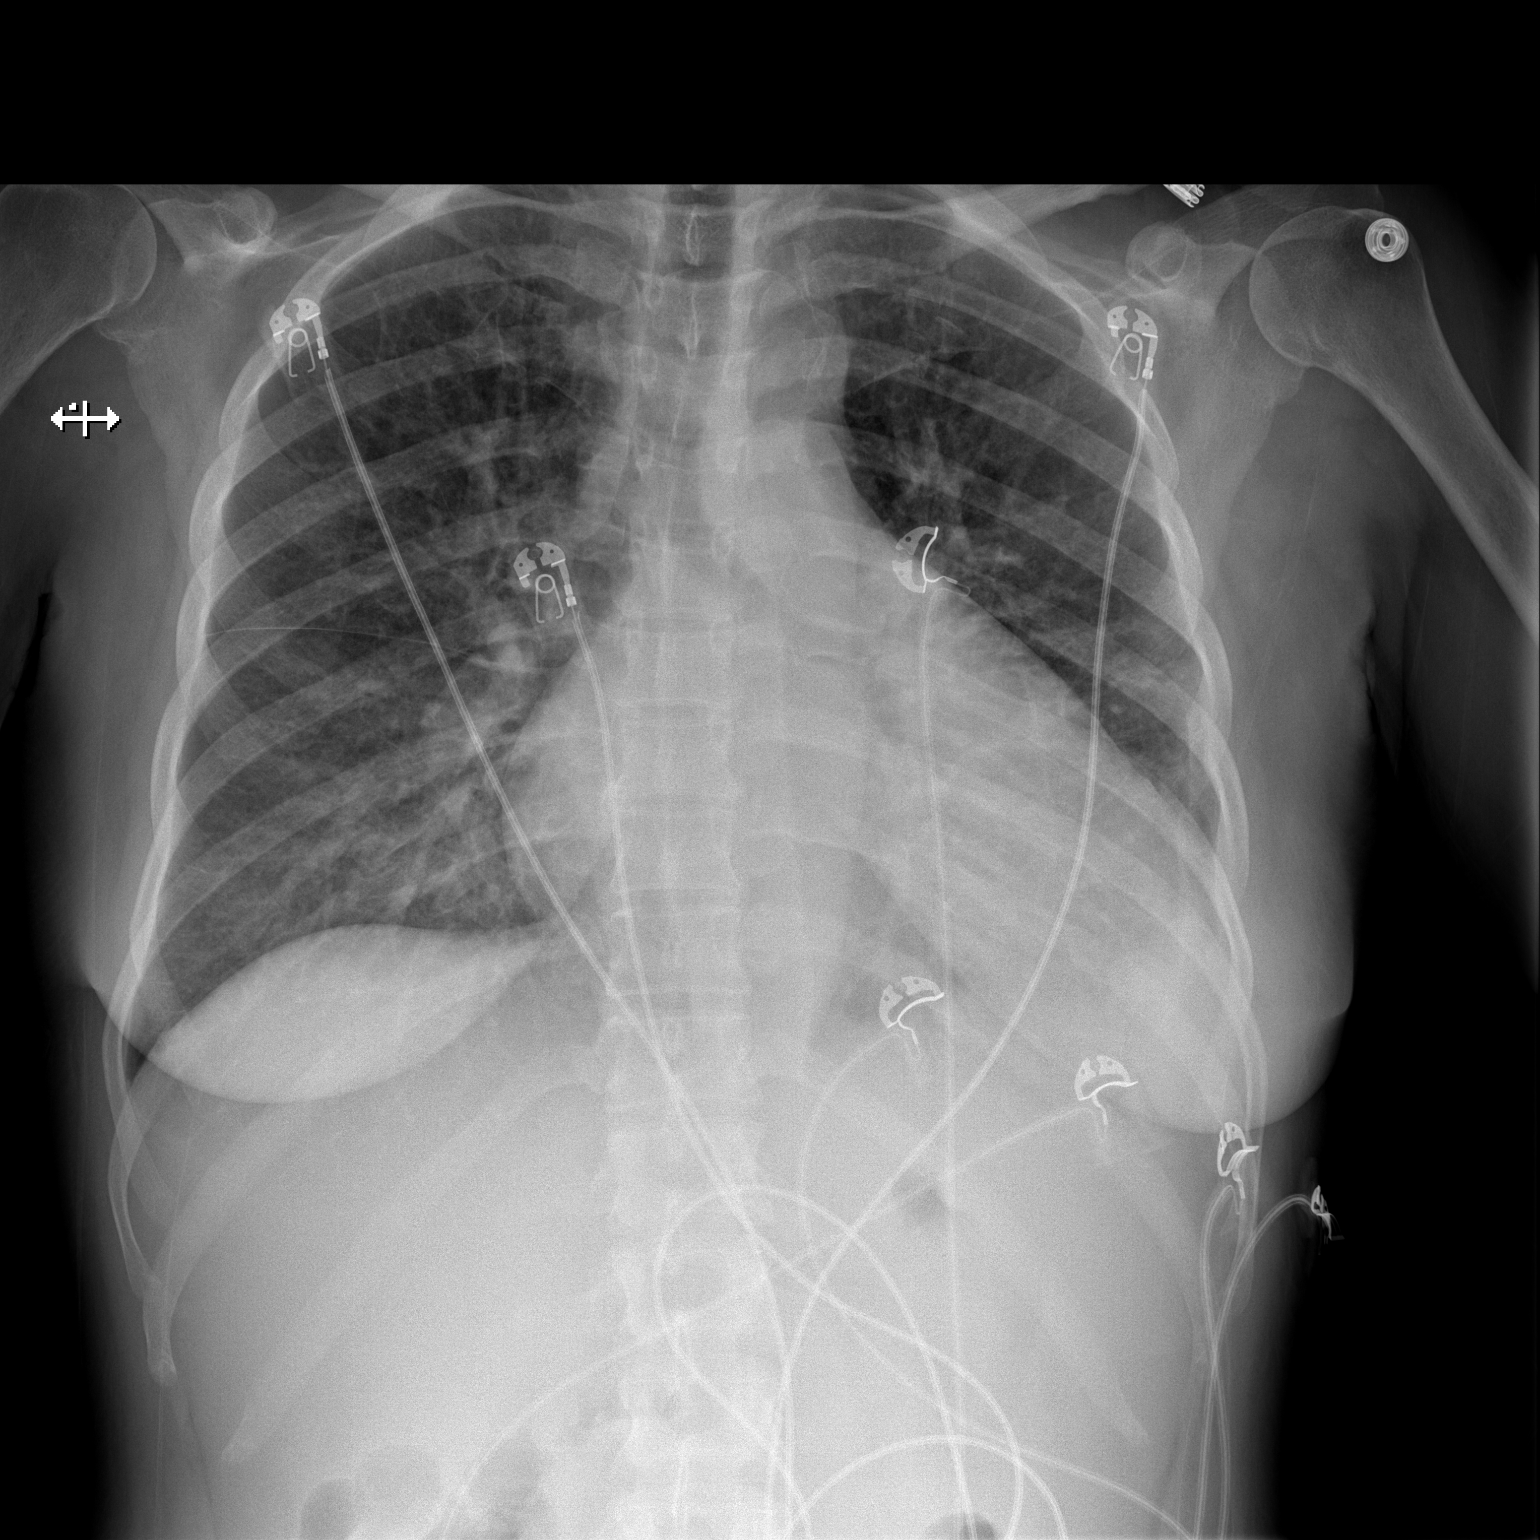

[w chest lat]
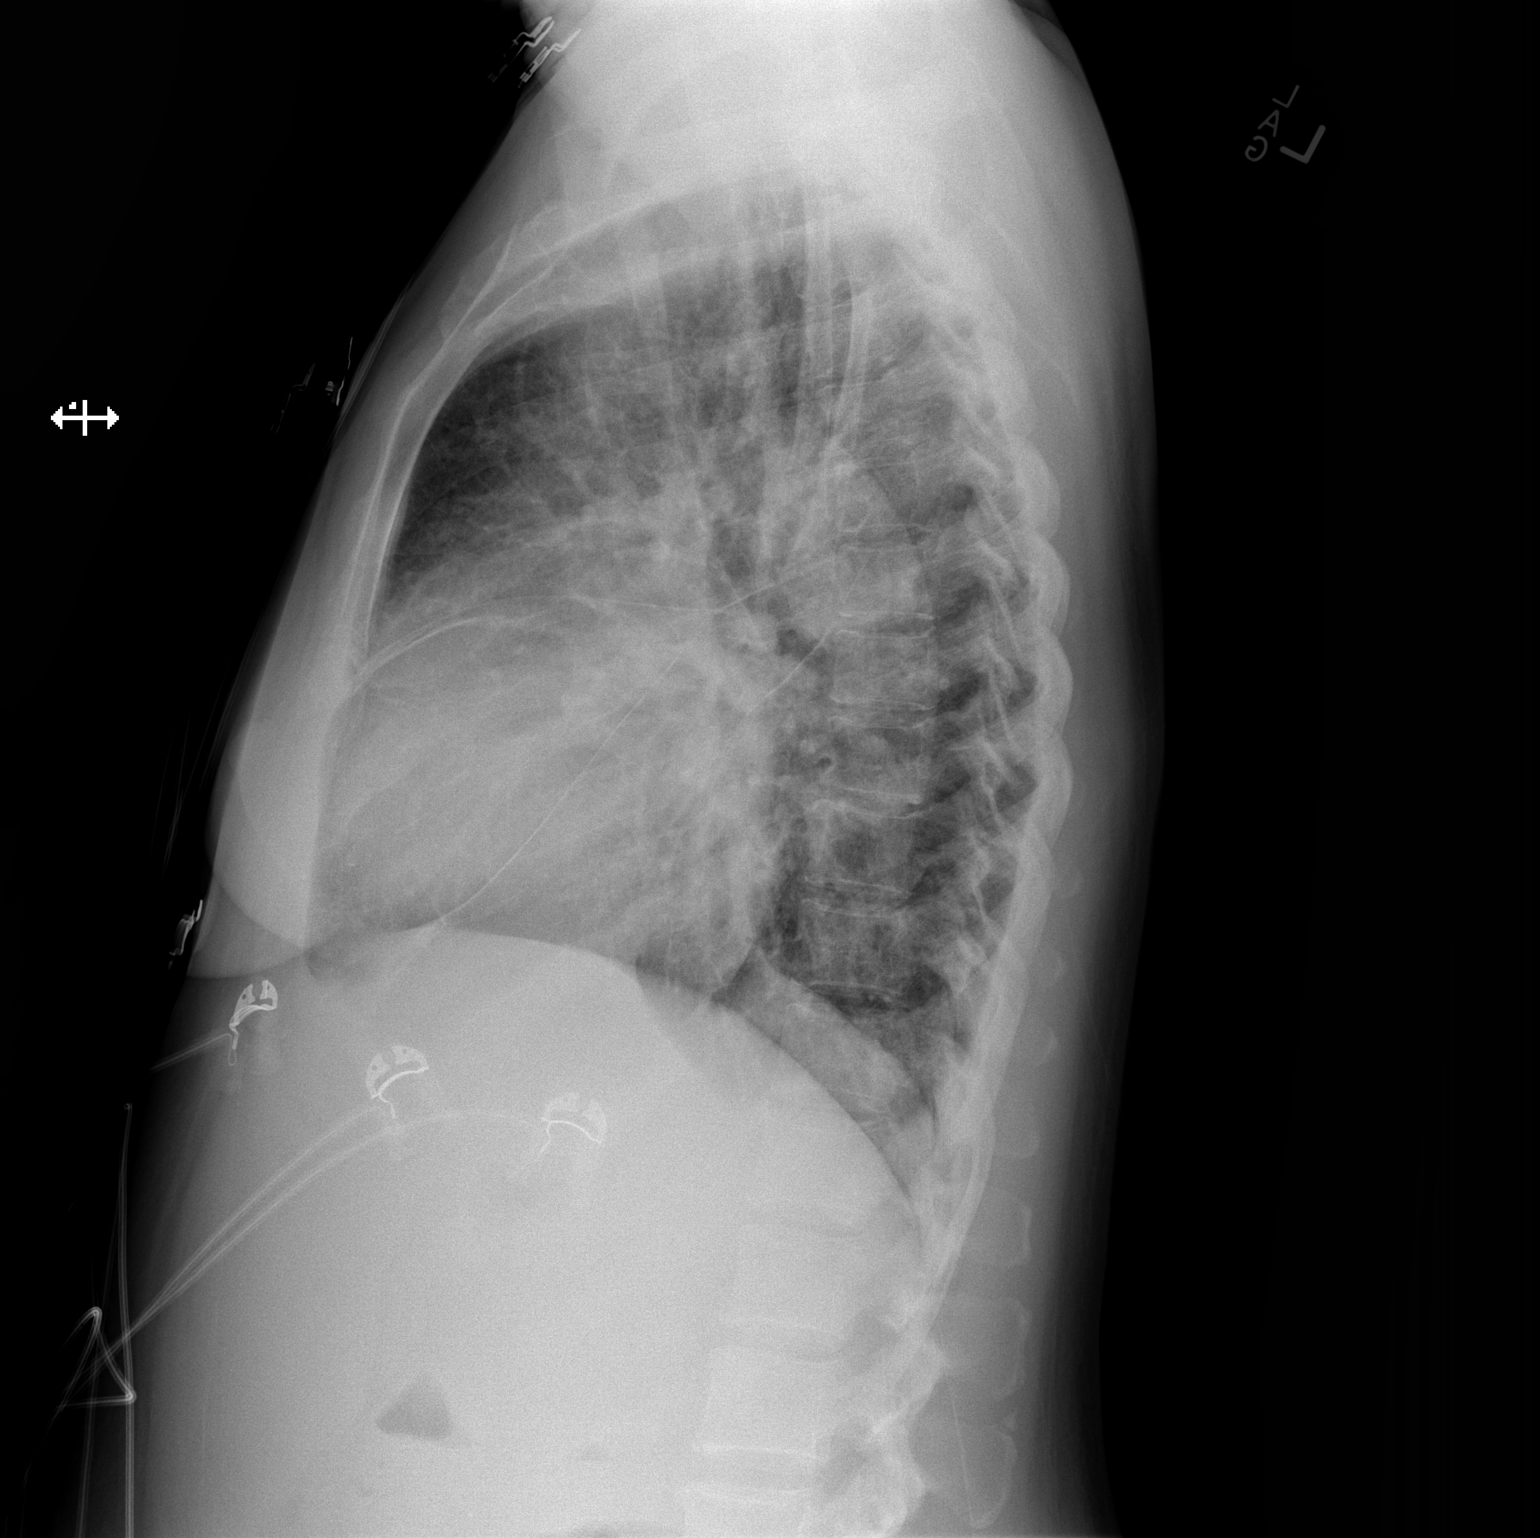

[2 of 2 positions shown; findings below may reference images not displayed]

FINDINGS: Cardiac silhouette is mildly enlarged unchanged. Mediastinal
silhouette is nonsuspicious. Mild pulmonary vascular congestion.
Strandy densities in the lung bases with LEFT lung base scarring and
pleural thickening. No pleural effusion. No pneumothorax. Soft
tissue planes and included osseous structures are normal. Small C7
ribs.
IMPRESSION: Stable cardiomegaly and pulmonary vascular congestion.

Bibasilar atelectasis, less likely pneumonia or confluent edema.

## 2018-02-13 ENCOUNTER — Ambulatory Visit (HOSPITAL_COMMUNITY)
Admission: RE | Admit: 2018-02-13 | Discharge: 2018-02-13 | Disposition: A | Discharge status: Home / Self Care | Payer: Medicare Other | Source: Ambulatory Visit | Attending: Vascular Surgery | Admitting: Vascular Surgery

## 2018-02-13 ENCOUNTER — Other Ambulatory Visit (HOSPITAL_COMMUNITY): Payer: Self-pay | Admitting: Nephrology

## 2018-02-13 DIAGNOSIS — M79604 Pain in right leg: Secondary | ICD-10-CM | POA: Diagnosis present

## 2018-03-16 ENCOUNTER — Inpatient Hospital Stay (HOSPITAL_COMMUNITY)
Admission: EM | Admit: 2018-03-16 | Discharge: 2018-03-19 | DRG: 640 | Disposition: A | Discharge status: Skilled Nursing Facility | Payer: Medicare Other | Attending: Family Medicine | Admitting: Family Medicine

## 2018-03-16 ENCOUNTER — Emergency Department (HOSPITAL_COMMUNITY): Payer: Medicare Other

## 2018-03-16 ENCOUNTER — Other Ambulatory Visit: Payer: Self-pay

## 2018-03-16 ENCOUNTER — Encounter (HOSPITAL_COMMUNITY): Payer: Self-pay | Admitting: Emergency Medicine

## 2018-03-16 DIAGNOSIS — Z8673 Personal history of transient ischemic attack (TIA), and cerebral infarction without residual deficits: Secondary | ICD-10-CM

## 2018-03-16 DIAGNOSIS — I5042 Chronic combined systolic (congestive) and diastolic (congestive) heart failure: Secondary | ICD-10-CM | POA: Diagnosis present

## 2018-03-16 DIAGNOSIS — E1121 Type 2 diabetes mellitus with diabetic nephropathy: Secondary | ICD-10-CM | POA: Diagnosis present

## 2018-03-16 DIAGNOSIS — H409 Unspecified glaucoma: Secondary | ICD-10-CM | POA: Diagnosis present

## 2018-03-16 DIAGNOSIS — E8779 Other fluid overload: Secondary | ICD-10-CM

## 2018-03-16 DIAGNOSIS — Z9115 Patient's noncompliance with renal dialysis: Secondary | ICD-10-CM

## 2018-03-16 DIAGNOSIS — F419 Anxiety disorder, unspecified: Secondary | ICD-10-CM | POA: Diagnosis present

## 2018-03-16 DIAGNOSIS — N186 End stage renal disease: Secondary | ICD-10-CM

## 2018-03-16 DIAGNOSIS — I132 Hypertensive heart and chronic kidney disease with heart failure and with stage 5 chronic kidney disease, or end stage renal disease: Secondary | ICD-10-CM | POA: Diagnosis present

## 2018-03-16 DIAGNOSIS — N2581 Secondary hyperparathyroidism of renal origin: Secondary | ICD-10-CM | POA: Diagnosis present

## 2018-03-16 DIAGNOSIS — D61818 Other pancytopenia: Secondary | ICD-10-CM | POA: Diagnosis present

## 2018-03-16 DIAGNOSIS — E8889 Other specified metabolic disorders: Secondary | ICD-10-CM | POA: Diagnosis present

## 2018-03-16 DIAGNOSIS — F259 Schizoaffective disorder, unspecified: Secondary | ICD-10-CM | POA: Diagnosis present

## 2018-03-16 DIAGNOSIS — E1151 Type 2 diabetes mellitus with diabetic peripheral angiopathy without gangrene: Secondary | ICD-10-CM | POA: Diagnosis present

## 2018-03-16 DIAGNOSIS — Z885 Allergy status to narcotic agent status: Secondary | ICD-10-CM

## 2018-03-16 DIAGNOSIS — Z794 Long term (current) use of insulin: Secondary | ICD-10-CM

## 2018-03-16 DIAGNOSIS — Z531 Procedure and treatment not carried out because of patient's decision for reasons of belief and group pressure: Secondary | ICD-10-CM | POA: Diagnosis present

## 2018-03-16 DIAGNOSIS — F319 Bipolar disorder, unspecified: Secondary | ICD-10-CM | POA: Diagnosis present

## 2018-03-16 DIAGNOSIS — Z992 Dependence on renal dialysis: Secondary | ICD-10-CM

## 2018-03-16 DIAGNOSIS — Z8614 Personal history of Methicillin resistant Staphylococcus aureus infection: Secondary | ICD-10-CM

## 2018-03-16 DIAGNOSIS — Z79899 Other long term (current) drug therapy: Secondary | ICD-10-CM

## 2018-03-16 DIAGNOSIS — D631 Anemia in chronic kidney disease: Secondary | ICD-10-CM | POA: Diagnosis present

## 2018-03-16 DIAGNOSIS — Z888 Allergy status to other drugs, medicaments and biological substances status: Secondary | ICD-10-CM

## 2018-03-16 DIAGNOSIS — R079 Chest pain, unspecified: Secondary | ICD-10-CM

## 2018-03-16 DIAGNOSIS — M109 Gout, unspecified: Secondary | ICD-10-CM | POA: Diagnosis present

## 2018-03-16 DIAGNOSIS — E1122 Type 2 diabetes mellitus with diabetic chronic kidney disease: Secondary | ICD-10-CM | POA: Diagnosis present

## 2018-03-16 DIAGNOSIS — E875 Hyperkalemia: Principal | ICD-10-CM | POA: Diagnosis present

## 2018-03-16 DIAGNOSIS — K219 Gastro-esophageal reflux disease without esophagitis: Secondary | ICD-10-CM | POA: Diagnosis present

## 2018-03-16 DIAGNOSIS — Z91048 Other nonmedicinal substance allergy status: Secondary | ICD-10-CM

## 2018-03-16 DIAGNOSIS — E785 Hyperlipidemia, unspecified: Secondary | ICD-10-CM | POA: Diagnosis present

## 2018-03-16 DIAGNOSIS — B1920 Unspecified viral hepatitis C without hepatic coma: Secondary | ICD-10-CM | POA: Diagnosis present

## 2018-03-16 DIAGNOSIS — Z880 Allergy status to penicillin: Secondary | ICD-10-CM

## 2018-03-16 DIAGNOSIS — F17210 Nicotine dependence, cigarettes, uncomplicated: Secondary | ICD-10-CM | POA: Diagnosis present

## 2018-03-16 DIAGNOSIS — Z91040 Latex allergy status: Secondary | ICD-10-CM

## 2018-03-16 DIAGNOSIS — Z89432 Acquired absence of left foot: Secondary | ICD-10-CM

## 2018-03-16 DIAGNOSIS — E119 Type 2 diabetes mellitus without complications: Secondary | ICD-10-CM

## 2018-03-16 LAB — CBC
HCT: 35.3 % — ABNORMAL LOW (ref 36.0–46.0)
HEMOGLOBIN: 10.8 g/dL — AB (ref 12.0–15.0)
MCH: 31.5 pg (ref 26.0–34.0)
MCHC: 30.6 g/dL (ref 30.0–36.0)
MCV: 102.9 fL — ABNORMAL HIGH (ref 78.0–100.0)
PLATELETS: 99 10*3/uL — AB (ref 150–400)
RBC: 3.43 MIL/uL — AB (ref 3.87–5.11)
RDW: 17.2 % — ABNORMAL HIGH (ref 11.5–15.5)
WBC: 2.5 10*3/uL — AB (ref 4.0–10.5)

## 2018-03-16 LAB — BASIC METABOLIC PANEL
ANION GAP: 17 — AB (ref 5–15)
BUN: 81 mg/dL — ABNORMAL HIGH (ref 6–20)
CALCIUM: 8 mg/dL — AB (ref 8.9–10.3)
CHLORIDE: 88 mmol/L — AB (ref 98–111)
CO2: 27 mmol/L (ref 22–32)
CREATININE: 9.56 mg/dL — AB (ref 0.44–1.00)
GFR calc Af Amer: 5 mL/min — ABNORMAL LOW (ref >60)
GFR calc non Af Amer: 4 mL/min — ABNORMAL LOW (ref >60)
Glucose, Bld: 295 mg/dL — ABNORMAL HIGH (ref 70–99)
Potassium: 6.7 mmol/L (ref 3.5–5.1)
SODIUM: 132 mmol/L — AB (ref 135–145)

## 2018-03-16 LAB — I-STAT CHEM 8, ED
BUN: 72 mg/dL — ABNORMAL HIGH (ref 6–20)
CALCIUM ION: 0.84 mmol/L — AB (ref 1.15–1.40)
Chloride: 92 mmol/L — ABNORMAL LOW (ref 98–111)
Creatinine, Ser: 10 mg/dL — ABNORMAL HIGH (ref 0.44–1.00)
Glucose, Bld: 294 mg/dL — ABNORMAL HIGH (ref 70–99)
HEMATOCRIT: 36 % (ref 36.0–46.0)
Hemoglobin: 12.2 g/dL (ref 12.0–15.0)
Potassium: 6.4 mmol/L (ref 3.5–5.1)
Sodium: 128 mmol/L — ABNORMAL LOW (ref 135–145)
TCO2: 29 mmol/L (ref 22–32)

## 2018-03-16 LAB — I-STAT TROPONIN, ED: TROPONIN I, POC: 0 ng/mL (ref 0.00–0.08)

## 2018-03-16 MED ORDER — ACETAMINOPHEN 650 MG RE SUPP: 650.0000 mg | Freq: Four times a day (QID) | RECTAL | Status: DC | PRN

## 2018-03-16 MED ORDER — SODIUM POLYSTYRENE SULFONATE 15 GM/60ML PO SUSP
15.0000 g | Freq: Once | ORAL | Status: AC
  Administered 2018-03-17: 15 g via ORAL
  Filled 2018-03-16: qty 60

## 2018-03-16 MED ORDER — HEPARIN SODIUM (PORCINE) 5000 UNIT/ML IJ SOLN
5000.0000 [IU] | Freq: Three times a day (TID) | INTRAMUSCULAR | Status: DC
  Administered 2018-03-16 – 2018-03-19 (×7): 5000 [IU] via SUBCUTANEOUS
  Filled 2018-03-16 (×7): qty 1

## 2018-03-16 MED ORDER — SODIUM CHLORIDE 0.9% FLUSH: 3.0000 mL | INTRAVENOUS | Status: DC | PRN

## 2018-03-16 MED ORDER — SODIUM CHLORIDE 0.9% FLUSH
3.0000 mL | Freq: Two times a day (BID) | INTRAVENOUS | Status: DC
  Administered 2018-03-17 – 2018-03-18 (×4): 3 mL via INTRAVENOUS

## 2018-03-16 MED ORDER — SODIUM BICARBONATE 8.4 % IV SOLN
50.0000 meq | Freq: Once | INTRAVENOUS | Status: AC
  Administered 2018-03-16: 50 meq via INTRAVENOUS
  Filled 2018-03-16: qty 50

## 2018-03-16 MED ORDER — ACETAMINOPHEN 325 MG PO TABS
650.0000 mg | ORAL_TABLET | Freq: Four times a day (QID) | ORAL | Status: DC | PRN
  Administered 2018-03-17 – 2018-03-19 (×3): 650 mg via ORAL
  Filled 2018-03-16 (×3): qty 2

## 2018-03-16 MED ORDER — SODIUM CHLORIDE 0.9 % IV SOLN: 250.0000 mL | INTRAVENOUS | Status: DC | PRN

## 2018-03-16 MED ORDER — CHLORHEXIDINE GLUCONATE CLOTH 2 % EX PADS
6.0000 | MEDICATED_PAD | Freq: Every day | CUTANEOUS | Status: DC
  Administered 2018-03-18: 6 via TOPICAL

## 2018-03-16 MED ORDER — SODIUM CHLORIDE 0.9 % IV SOLN
1.0000 g | Freq: Once | INTRAVENOUS | Status: AC
  Administered 2018-03-16: 1 g via INTRAVENOUS
  Filled 2018-03-16: qty 10

## 2018-03-16 NOTE — ED Notes (Signed)
I-Stat Chem 8 abnormal results, Potassium of 6.4 and Calcium, Ion of 0.84 has been reported to [employer], [employer].

## 2018-03-16 NOTE — H&P (Signed)
TRH H&P   Patient Demographics:    [name], is a 52 y.o. female  MRN: [id number]   DOB - [birth year]  Admit Date - 03/16/2018  Outpatient Primary MD for the patient is [name], MD  Referring MD/NP/PA:    Blanchie Dessert  Outpatient Specialists:     Patient coming from: home  Chief Complaint  Patient presents with  . Chest Pain  . Needs Dialysis      HPI:    [name]  is a 52 y.o. female, w Hepatitis C, hypertension, hyperlipidemia, dm2, ESRD on HD T, T, S, CHF (EF 40%), C/o dyspnea and slight lower ext edema. Pt notes missing dialysis this past Tuesday.   In ED,  CXR IMPRESSION: Cardiomegaly with vascular congestion and minimal interstitial edema.  Na 132, K 6.7, Bun 81, Creatinine 9.56 Wbc 2.5, hgb 10.8, Plt 99 Trop 0.00  Pt will be admitted for hyperkalemia, and dyspnea secondary to fluid overload after missing HD.    Review of systems:    In addition to the HPI above, No Fever-chills, No Headache, No changes with Vision or hearing, No problems swallowing food or Liquids, No Chest pain, No  Cough No Abdominal pain, No Nausea or Vommitting, Bowel movements are regular, No Blood in stool or Urine, No dysuria, No new skin rashes or bruises, No new joints pains-aches,  No new weakness, tingling, numbness in any extremity, No recent weight gain or loss, No polyuria, polydypsia or polyphagia, No significant Mental Stressors.  A full 10 point Review of Systems was done, except as stated above, all other Review of Systems were negative.   With Past History of the following :    Past Medical History:  Diagnosis Date  . Anemia   . Anxiety   . Bipolar disorder, unspecified ([place]) 02/06/2014  . Chronic combined systolic and diastolic CHF (congestive heart failure) (HCC)    EF 40% by echo and 48% by stress test  . Chronic pain syndrome   .  Depression   . ESRD on hemodialysis ([place])   . GERD (gastroesophageal reflux disease)   . Glaucoma   . Headache    sinus headaches  . Hepatitis C   . High cholesterol   . History of hiatal hernia   . History of vitamin D deficiency 06/20/2015  . Hypertension   . MRSA infection ?2014   "on the back of my head; spread throughout my bloodstream"  . Neuropathy   . Refusal of blood transfusions as patient is Jehovah's Witness   . Schizoaffective disorder, unspecified condition 02/08/2014  . Seizure [facility]) dx'd 2014   "don't know what kind; last one was ~ 04/2014"  . Stroke [facility])    TIA 2009  . TIA (transient ischemic attack) 2010  . Type II diabetes mellitus ([place])    INSULIN DEPENDENT      Past Surgical History:  Procedure Laterality Date  . ABDOMINAL HYSTERECTOMY  2014  . ABDOMINAL HYSTERECTOMY  2010  . AMPUTATION Left 05/21/2013   Procedure: Left Midfoot AMPUTATION;  Surgeon: Newt Minion, MD;  Location: [place];  Service: Orthopedics;  Laterality: Left;  Left Midfoot Amputation  . AV FISTULA PLACEMENT Left 05/04/2015   Procedure: ARTERIOVENOUS (AV) GRAFT INSERTION;  Surgeon: Angelia Mould, MD;  Location: [place];  Service: Vascular;  Laterality: Left;  . COLONOSCOPY WITH PROPOFOL N/A 06/22/2013   Procedure: COLONOSCOPY WITH PROPOFOL;  Surgeon: Jeryl Columbia, MD;  Location: [facility] ENDOSCOPY;  Service: Endoscopy;  Laterality: N/A;  . ESOPHAGOGASTRODUODENOSCOPY N/A 05/11/2015   Procedure: ESOPHAGOGASTRODUODENOSCOPY (EGD);  Surgeon: Laurence Spates, MD;  Location: [facility] ENDOSCOPY;  Service: Endoscopy;  Laterality: N/A;  . EYE SURGERY Bilateral 2010   Lasik  . FLEXIBLE SIGMOIDOSCOPY N/A 01/17/2017   Procedure: FLEXIBLE SIGMOIDOSCOPY w/ hemorrhoid banding possible;  Surgeon: Gatha Mayer, MD;  Location: [facility] ENDOSCOPY;  Service: Endoscopy;  Laterality: N/A;  . FOOT AMPUTATION Left    due diabetic neuropathy, could not feel ulcer on bottom of foot  . REFRACTIVE SURGERY Bilateral 2013  .  TONSILLECTOMY  1970's  . TUBAL LIGATION  2000      Social History:     Social History   Tobacco Use  . Smoking status: Current Some Day Smoker    Packs/day: 0.10    Years: 30.00    Pack years: 3.00    Types: Cigarettes    Last attempt to quit: 03/31/2015    Years since quitting: 2.9  . Smokeless tobacco: Never Used  Substance Use Topics  . Alcohol use: Yes    Alcohol/week: 3.0 standard drinks    Types: 3 Standard drinks or equivalent per week    Frequency: Never    Comment: last week     Lives - at home  Mobility - walks by self   Family History :     Family History  Problem Relation Age of Onset  . Hypertension Mother   . Diabetes Mother   . Hypertension Father   . Diabetes Father        Home Medications:   Prior to Admission medications   Medication Sig Start Date End Date Taking? Authorizing Provider  albuterol (PROAIR HFA) 108 (90 Base) MCG/ACT inhaler Inhale 2 puffs into the lungs every 4 (four) hours as needed for wheezing or shortness of breath.    [provider]  albuterol (PROVENTIL HFA;VENTOLIN HFA) 108 (90 Base) MCG/ACT inhaler Inhale 2 puffs into the lungs every 4 (four) hours as needed for wheezing or shortness of breath. Patient not taking: Reported on 09/09/2017 10/15/16   Horton, Barbette Hair, MD  albuterol (PROVENTIL) (2.5 MG/3ML) 0.083% nebulizer solution Take 2.5 mg by nebulization every 6 (six) hours as needed for wheezing or shortness of breath.    [provider]  allopurinol (ZYLOPRIM) 100 MG tablet Take 1 tablet (100 mg total) by mouth every Tuesday, Thursday, and Saturday at 6 PM. 01/25/17   Nita Sells, MD  Amino Acids-Protein Hydrolys (PRO-STAT) LIQD Take 30 mLs by mouth 2 (two) times daily.    [provider]  amLODipine (NORVASC) 10 MG tablet Take 1 tablet (10 mg total) by mouth at bedtime. 09/02/16   Hildred Priest, MD  atorvastatin (LIPITOR) 40 MG tablet Take 1 tablet (40 mg total) by mouth  at bedtime. 09/02/16   Hildred Priest, MD  bisacodyl (DULCOLAX) 10 MG suppository Place 10 mg rectally daily as needed (  for constipation).    [provider]  calcium acetate (PHOSLO) 667 MG capsule Take 2 capsules (1,334 mg total) by mouth 3 (three) times daily with meals. 09/02/16   Hildred Priest, MD  carvedilol (COREG) 25 MG tablet Take 1 tablet (25 mg total) by mouth 2 (two) times daily with a meal. 09/02/16   Hildred Priest, MD  cloNIDine (CATAPRES) 0.1 MG tablet Take 1 tablet (0.1 mg total) by mouth every 8 (eight) hours. 09/02/16   Hildred Priest, MD  diphenhydrAMINE (BENADRYL) 25 mg capsule Take 25 mg by mouth every 6 (six) hours as needed for itching.     [provider]  DULoxetine (CYMBALTA) 30 MG capsule Take 30 mg by mouth daily.    [provider]  Ferrous Sulfate (SLOW FE) 142 (45 Fe) MG TBCR Take 1 tablet by mouth daily. Patient not taking: Reported on 09/09/2017 07/19/17   Mariel Aloe, MD  ferrous sulfate 325 (65 FE) MG tablet Take 325 mg by mouth daily.    [provider]  hydrocortisone (ANUSOL-HC) 2.5 % rectal cream Place rectally 2 (two) times daily. Patient taking differently: Place 1 application rectally 2 (two) times daily.  01/24/17   Nita Sells, MD  insulin aspart (NOVOLOG) 100 UNIT/ML injection Inject 7 Units into the skin 3 (three) times daily with meals. Patient taking differently: Inject 7 Units into the skin See admin instructions. 7 units into the skin three times a day before meals, plus a sliding scale: BGL 101-150 = 2 units; 151-200 = 3 units; 201-250 = 5 units; 251-300 = 7 units; 301-350 = 9 units; >350 = 11 units; >400 or <60 = CALL MD 06/07/17   Ina Homes, MD  Insulin Detemir (LEVEMIR FLEXTOUCH) 100 UNIT/ML Pen Inject 20 Units into the skin at bedtime.    [provider]  insulin glargine (LANTUS) 100 UNIT/ML injection Inject 0.2 mLs (20 Units total) into the  skin at bedtime. Patient not taking: Reported on 09/09/2017 06/07/17   Ina Homes, MD  LORazepam (ATIVAN) 0.5 MG tablet Take 1 tablet (0.5 mg total) by mouth every 8 (eight) hours as needed for anxiety. Patient not taking: Reported on 09/09/2017 07/19/17   Mariel Aloe, MD  Multiple Vitamins-Minerals (MULTIVITAMIN PO) Take 1 tablet by mouth daily.    [provider]  multivitamin (RENA-VIT) TABS tablet Take 1 tablet by mouth at bedtime. Patient not taking: Reported on 09/09/2017 09/02/16   Hildred Priest, MD  mupirocin ointment (BACTROBAN) 2 % Apply 1 application topically daily. 03/06/17   [provider]  Nutritional Supplements (FEEDING SUPPLEMENT, NEPRO CARB STEADY,) LIQD Take 237 mLs by mouth 3 (three) times daily between meals. Patient not taking: Reported on 09/09/2017 02/28/17   Cristal Ford, DO  Oxycodone HCl 10 MG TABS Take 1 tablet (10 mg total) by mouth every 6 (six) hours as needed. Patient taking differently: Take 10 mg by mouth every 6 (six) hours as needed (for pain).  07/19/17   Mariel Aloe, MD  OXYGEN Inhale 4 L into the lungs continuous.    [provider]  pantoprazole (PROTONIX) 40 MG tablet Take 1 tablet (40 mg total) by mouth daily at 6 (six) AM. 07/20/17   Mariel Aloe, MD  polyethylene glycol (MIRALAX / GLYCOLAX) packet Take 17 g by mouth 3 (three) times daily.    [provider]  sertraline (ZOLOFT) 50 MG tablet Take 1 tablet (50 mg total) by mouth daily. Patient not taking: Reported on 09/09/2017 07/19/17  Mariel Aloe, MD  traZODone (DESYREL) 50 MG tablet Take 0.5 tablets (25 mg total) by mouth at bedtime. Patient not taking: Reported on 09/09/2017 07/19/17   Mariel Aloe, MD  vitamin C (ASCORBIC ACID) 500 MG tablet Take 500 mg by mouth daily.    [provider]     Allergies:     Allergies  Allergen Reactions  . Penicillins Hives and Other (See Comments)    Has patient had a PCN reaction causing  immediate rash, facial/tongue/throat swelling, SOB or lightheadedness with hypotension: Yes Has patient had a PCN reaction causing severe rash involving mucus membranes or skin necrosis: No Has patient had a PCN reaction that required hospitalization No Has patient had a PCN reaction occurring within the last 10 years: No If all of the above answers are "NO", then may proceed with Cephalosporin use. Pt tolerates cefepime and ceftriaxone  . Gabapentin Itching and Swelling  . Lyrica [Pregabalin] Swelling and Other (See Comments)    Facial and leg swelling  . Saphris [Asenapine] Swelling and Other (See Comments)    Facial swelling  . Tramadol Swelling and Other (See Comments)    Leg swelling  . Adhesive [Tape] Itching    SEVERE ITCHING  . Latex Itching    SEVERE ITCHING  . Other Other (See Comments)    NO BLOOD PRODUCTS; PATIENT IS A JEHOVAH'S WITNESS     Physical Exam:   Vitals  Blood pressure (!) 190/78, pulse 71, temperature 97.8 F (36.6 C), temperature source Oral, resp. rate 19, height 5\' 4"  (1.626 m), weight 73.7 kg, SpO2 (!) 88 %.   1. General  lying in bed in NAD,    2. Normal affect and insight, Not Suicidal or Homicidal, Awake Alert, Oriented X 3.  3. No F.N deficits, ALL C.Nerves Intact, Strength 5/5 all 4 extremities, Sensation intact all 4 extremities, Plantars down going.  4. Ears and Eyes appear Normal, Conjunctivae clear, PERRLA. Moist Oral Mucosa.  5. Supple Neck, No JVD, No cervical lymphadenopathy appriciated, No Carotid Bruits.  6. Symmetrical Chest wall movement, Good air movement bilaterally, slight crackles at bilateral base, no wheezing  7. RRR, No Gallops, Rubs or Murmurs, No Parasternal Heave.  8. Positive Bowel Sounds, Abdomen Soft, No tenderness, No organomegaly appriciated,No rebound -guarding or rigidity.  9.  No Cyanosis, Normal Skin Turgor, No Skin Rash or Bruise.  10. Good muscle tone,  joints appear normal , no effusions, Normal  ROM.  11. No Palpable Lymph Nodes in Neck or Axillae      Data Review:    CBC Recent Labs  Lab 03/16/18 2122 03/16/18 2129  WBC 2.5*  --   HGB 10.8* 12.2  HCT 35.3* 36.0  PLT 99*  --   MCV 102.9*  --   MCH 31.5  --   MCHC 30.6  --   RDW 17.2*  --    ------------------------------------------------------------------------------------------------------------------  Chemistries  Recent Labs  Lab 03/16/18 2122 03/16/18 2129  NA 132* 128*  K 6.7* 6.4*  CL 88* 92*  CO2 27  --   GLUCOSE 295* 294*  BUN 81* 72*  CREATININE 9.56* 10.00*  CALCIUM 8.0*  --    ------------------------------------------------------------------------------------------------------------------ estimated creatinine clearance is 6.5 mL/min (A) (by C-G formula based on SCr of 10 mg/dL (H)). ------------------------------------------------------------------------------------------------------------------ No results for input(s): TSH, T4TOTAL, T3FREE, THYROIDAB in the last 72 hours.  Invalid input(s): FREET3  Coagulation profile No results for input(s): INR, PROTIME in the last 168 hours. ------------------------------------------------------------------------------------------------------------------- No  results for input(s): DDIMER in the last 72 hours. -------------------------------------------------------------------------------------------------------------------  Cardiac Enzymes No results for input(s): CKMB, TROPONINI, MYOGLOBIN in the last 168 hours.  Invalid input(s): CK ------------------------------------------------------------------------------------------------------------------    Component Value Date/Time   BNP >4,500.0 (H) 08/07/2017 1029     ---------------------------------------------------------------------------------------------------------------  Urinalysis    Component Value Date/Time   COLORURINE YELLOW 02/25/2017 1224   APPEARANCEUR HAZY (A) 02/25/2017 1224    LABSPEC 1.013 02/25/2017 1224   PHURINE 8.0 02/25/2017 1224   GLUCOSEU >=500 (A) 02/25/2017 1224   HGBUR SMALL (A) 02/25/2017 1224   BILIRUBINUR NEGATIVE 02/25/2017 1224   BILIRUBINUR neg 12/13/2014 1127   KETONESUR NEGATIVE 02/25/2017 1224   PROTEINUR >=300 (A) 02/25/2017 1224   UROBILINOGEN 0.2 05/01/2015 1252   NITRITE NEGATIVE 02/25/2017 1224   LEUKOCYTESUR NEGATIVE 02/25/2017 1224    ----------------------------------------------------------------------------------------------------------------   Imaging Results:    Dg Chest 2 View  Result Date: 03/16/2018 CLINICAL DATA:  Chest pain short of breath weakness EXAM: CHEST - 2 VIEW COMPARISON:  08/07/2017, 07/16/2017 FINDINGS: No significant pleural effusion. Cardiomegaly with vascular congestion and mild pulmonary edema. No focal airspace disease. No pneumothorax. IMPRESSION: Cardiomegaly with vascular congestion and minimal interstitial edema. Electronically Signed   By: Donavan Foil M.D.   On: 03/16/2018 21:31       Assessment & Plan:    Active Problems:   Hyperkalemia    Hyperkalemia Calcium gluconate Sodium bicarbonate Kayexalate Check cmp in am  Dyspnea secondary to fluid overload ESRD on HD T, T, S Nephrology consulted, appreciate input  Dm2 fsbs ac and qhs, ISS  Hypertension Cont Norvasc 10mg  po qday Cont Carvedilol 25mg  po bid  Hyperlipidemia Cont lipitor 40mg  po qhs  Gout Cont Allopurinol 100mg  po qday  Anxiety Cont Cymbalta Cont Lorazepam  Gerd Cont PPI  DVT Prophylaxis Heparin  - SCDs  AM Labs Ordered, also please review Full Orders  Family Communication: Admission, patients condition and plan of care including tests being ordered have been discussed with the patient  who indicate understanding and agree with the plan and Code Status.  Code Status  FULL CODE  Likely DC to  home  Condition GUARDED   Consults called: nephrology by ED  Admission status: inpatient  Time spent in  minutes : 60   Jani Gravel M.D on 03/16/2018 at 11:15 PM  Between 7am to 7pm - Pager - [phone]  . After 7pm go to [email or web address] - password [facility]  [facility] - Office  [phone]

## 2018-03-16 NOTE — ED Provider Notes (Signed)
MSE was initiated and I personally evaluated the patient and placed orders (if any) at  9:07 PM on March 16, 2018.  The patient appears stable so that the remainder of the MSE may be completed by another provider.  Patient placed in Quick Look pathway, seen and evaluated   Chief Complaint: Chest pain  HPI:   Patient with history of ESRD, CVA, Seizures reports exertional CP PTA that resolved prior to arrival with nitro and ASA.  Patient missed dialysis on Saturday from her normal Tuesday, Thursday, Saturday cycle.  Patient reports that her symptoms of shortness of breath, leg swelling, feels similar to when she has missed dialysis in the past.  No syncope.  ROS: See HPI (one)  Physical Exam:   Gen: No distress  Neuro: Awake and Alert  Skin: Warm    Focused Exam: Radial pulses 2+ and equal bilaterally.  Heart regular rate and rhythm.  Coarse crackles in bilateral lung bases.  1+ nonpitting lower extremity edema, symmetric bilaterally.   Initiation of care has begun. The patient has been counseled on the process, plan, and necessity for staying for the completion/evaluation, and the remainder of the medical screening examination    [name] 03/16/18 2147    [name] Boss, MD 03/16/18 [phone]

## 2018-03-16 NOTE — ED Provider Notes (Signed)
[facility] EMERGENCY DEPARTMENT Provider Note   CSN: [id number] Arrival date & time: 03/16/18  2101     History   Chief Complaint Chief Complaint  Patient presents with  . Chest Pain  . Needs Dialysis    HPI [name] is a 52 y.o. female.  Patient is a 52 year old female with a history of end-stage renal disease on dialysis, CHF, anemia, hepatitis C, glaucoma, stroke, diabetes who currently lives in a nursing facility presenting today with concern for fluid overload and needing dialysis.  Patient typically dialyzes on Tuesdays Thursdays and Saturdays but states she missed her dialysis on Saturday because she overslept and was not ready when they came to get her.  She states today she has been short of breath having diffuse chest discomfort and intermittent headaches.  Patient also states she started to develop a cough but denies any fever, abdominal pain but is having some nausea as well.  Patient states these are typically the symptoms she develops when she has missed her dialysis.  Symptoms are made worse by lying down.  The history is provided by the patient.  Chest Pain   This is a new problem. The current episode started 6 to 12 hours ago. The problem occurs constantly. The problem has been gradually worsening. Pain location: diffuse throughout the chest. The pain is at a severity of 4/10. The pain is moderate. The quality of the pain is described as heavy. The pain does not radiate. Associated symptoms include cough, nausea and shortness of breath. Pertinent negatives include no vomiting. She has tried nothing for the symptoms. The treatment provided no relief. Risk factors: esrd on dialysis.    Past Medical History:  Diagnosis Date  . Anemia   . Anxiety   . Bipolar disorder, unspecified ([place]) 02/06/2014  . Chronic combined systolic and diastolic CHF (congestive heart failure) (HCC)    EF 40% by echo and 48% by stress test  . Chronic pain syndrome   .  Depression   . ESRD on hemodialysis ([place])   . GERD (gastroesophageal reflux disease)   . Glaucoma   . Headache    sinus headaches  . Hepatitis C   . High cholesterol   . History of hiatal hernia   . History of vitamin D deficiency 06/20/2015  . Hypertension   . MRSA infection ?2014   "on the back of my head; spread throughout my bloodstream"  . Neuropathy   . Refusal of blood transfusions as patient is Jehovah's Witness   . Schizoaffective disorder, unspecified condition 02/08/2014  . Seizure [facility]) dx'd 2014   "don't know what kind; last one was ~ 04/2014"  . Stroke [facility])    TIA 2009  . TIA (transient ischemic attack) 2010  . Type II diabetes mellitus ([place])    INSULIN DEPENDENT    Patient Active Problem List   Diagnosis Date Noted  . Localized edema due to fluid overload 09/05/2017  . Lower GI bleed   . Evaluation by psychiatric service required 07/17/2017  . Volume overload 06/03/2017  . Right supracondylar humerus fracture, with nonunion, subsequent encounter 05/01/2017  . Malnutrition of moderate degree 02/28/2017  . Leg pain, left 02/25/2017  . Left leg pain 02/25/2017  . External hemorrhoids   . Diverticulosis of colon without hemorrhage   . Goals of care, counseling/discussion   . Palliative care encounter   . Cellulitis of left leg 01/16/2017  . Diabetes mellitus with complication ([place]) 72/04/4708  . Upper  GI bleed 01/16/2017  . UGIB (upper gastrointestinal bleed)   . Anemia due to chronic kidney disease   . Cellulitis of left thigh 01/01/2017  . End stage renal disease ([place]) 01/01/2017  . Cocaine use disorder, severe, dependence ([place]) 08/29/2016  . Hepatitis C 08/29/2016  . Diabetic ulcer of calf ([place]) 08/29/2016  . Diabetic ulcer of toe ([place]) 08/29/2016  . Cerebrovascular accident (CVA) ([place]) 08/29/2016  . MDD (major depressive disorder) 08/28/2016  . Dyslipidemia associated with type 2 diabetes mellitus ([place]) 11/10/2015  . Hyperkalemia 09/17/2015  .  History of vitamin D deficiency 06/20/2015  . Long term prescription opiate use 06/20/2015  . Chronic pain syndrome 06/19/2015  . Anemia of chronic kidney failure 05/01/2015  . transmetatarsal amputation of the left foot 12/13/2014  . Unilateral visual loss 11/22/2014  . 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 ([place]) 11/22/2014  . Essential hypertension 11/09/2014  . Dizziness   . Chronic combined systolic (congestive) and diastolic (congestive) heart failure ([place])   . Rectal bleeding 04/23/2014  . Anemia, iron deficiency 04/13/2014  . Noncompliance 02/02/2014  . Hyponatremia 05/19/2013  . Seizure disorder ([place]) 02/09/2013    Past Surgical History:  Procedure Laterality Date  . ABDOMINAL HYSTERECTOMY  2014  . ABDOMINAL HYSTERECTOMY  2010  . AMPUTATION Left 05/21/2013   Procedure: Left Midfoot AMPUTATION;  Surgeon: Newt Minion, MD;  Location: [facility];  Service: Orthopedics;  Laterality: Left;  Left Midfoot Amputation  . AV FISTULA PLACEMENT Left 05/04/2015   Procedure: ARTERIOVENOUS (AV) GRAFT INSERTION;  Surgeon: Angelia Mould, MD;  Location: [facility];  Service: Vascular;  Laterality: Left;  . COLONOSCOPY WITH PROPOFOL N/A 06/22/2013   Procedure: COLONOSCOPY WITH PROPOFOL;  Surgeon: Jeryl Columbia, MD;  Location: [facility] ENDOSCOPY;  Service: Endoscopy;  Laterality: N/A;  . ESOPHAGOGASTRODUODENOSCOPY N/A 05/11/2015   Procedure: ESOPHAGOGASTRODUODENOSCOPY (EGD);  Surgeon: Laurence Spates, MD;  Location: [facility] ENDOSCOPY;  Service: Endoscopy;  Laterality: N/A;  . EYE SURGERY Bilateral 2010   Lasik  . FLEXIBLE SIGMOIDOSCOPY N/A 01/17/2017   Procedure: FLEXIBLE SIGMOIDOSCOPY w/ hemorrhoid banding possible;  Surgeon: Gatha Mayer, MD;  Location: [facility] ENDOSCOPY;  Service: Endoscopy;  Laterality: N/A;  . FOOT AMPUTATION Left    due diabetic neuropathy, could not feel ulcer on bottom of foot  . REFRACTIVE SURGERY Bilateral 2013  . TONSILLECTOMY  1970's  . TUBAL LIGATION  2000     OB History     Gravida  0   Para  0   Term  0   Preterm  0   AB  0   Living        SAB  0   TAB  0   Ectopic  0   Multiple      Live Births               Home Medications    Prior to Admission medications   Medication Sig Start Date End Date Taking? Authorizing Provider  albuterol (PROAIR HFA) 108 (90 Base) MCG/ACT inhaler Inhale 2 puffs into the lungs every 4 (four) hours as needed for wheezing or shortness of breath.    [provider]  albuterol (PROVENTIL HFA;VENTOLIN HFA) 108 (90 Base) MCG/ACT inhaler Inhale 2 puffs into the lungs every 4 (four) hours as needed for wheezing or shortness of breath. Patient not taking: Reported on 09/09/2017 10/15/16   Horton, Barbette Hair, MD  albuterol (PROVENTIL) (2.5 MG/3ML) 0.083% nebulizer solution Take 2.5 mg by nebulization every 6 (six)  hours as needed for wheezing or shortness of breath.    [provider]  allopurinol (ZYLOPRIM) 100 MG tablet Take 1 tablet (100 mg total) by mouth every Tuesday, Thursday, and Saturday at 6 PM. 01/25/17   Nita Sells, MD  Amino Acids-Protein Hydrolys (PRO-STAT) LIQD Take 30 mLs by mouth 2 (two) times daily.    [provider]  amLODipine (NORVASC) 10 MG tablet Take 1 tablet (10 mg total) by mouth at bedtime. 09/02/16   Hildred Priest, MD  atorvastatin (LIPITOR) 40 MG tablet Take 1 tablet (40 mg total) by mouth at bedtime. 09/02/16   Hildred Priest, MD  bisacodyl (DULCOLAX) 10 MG suppository Place 10 mg rectally daily as needed (for constipation).    [provider]  calcium acetate (PHOSLO) 667 MG capsule Take 2 capsules (1,334 mg total) by mouth 3 (three) times daily with meals. 09/02/16   Hildred Priest, MD  carvedilol (COREG) 25 MG tablet Take 1 tablet (25 mg total) by mouth 2 (two) times daily with a meal. 09/02/16   Hildred Priest, MD  cloNIDine (CATAPRES) 0.1 MG tablet Take 1 tablet (0.1 mg total) by mouth every 8  (eight) hours. 09/02/16   Hildred Priest, MD  diphenhydrAMINE (BENADRYL) 25 mg capsule Take 25 mg by mouth every 6 (six) hours as needed for itching.     [provider]  DULoxetine (CYMBALTA) 30 MG capsule Take 30 mg by mouth daily.    [provider]  Ferrous Sulfate (SLOW FE) 142 (45 Fe) MG TBCR Take 1 tablet by mouth daily. Patient not taking: Reported on 09/09/2017 07/19/17   Mariel Aloe, MD  ferrous sulfate 325 (65 FE) MG tablet Take 325 mg by mouth daily.    [provider]  hydrocortisone (ANUSOL-HC) 2.5 % rectal cream Place rectally 2 (two) times daily. Patient taking differently: Place 1 application rectally 2 (two) times daily.  01/24/17   Nita Sells, MD  insulin aspart (NOVOLOG) 100 UNIT/ML injection Inject 7 Units into the skin 3 (three) times daily with meals. Patient taking differently: Inject 7 Units into the skin See admin instructions. 7 units into the skin three times a day before meals, plus a sliding scale: BGL 101-150 = 2 units; 151-200 = 3 units; 201-250 = 5 units; 251-300 = 7 units; 301-350 = 9 units; >350 = 11 units; >400 or <60 = CALL MD 06/07/17   Ina Homes, MD  Insulin Detemir (LEVEMIR FLEXTOUCH) 100 UNIT/ML Pen Inject 20 Units into the skin at bedtime.    [provider]  insulin glargine (LANTUS) 100 UNIT/ML injection Inject 0.2 mLs (20 Units total) into the skin at bedtime. Patient not taking: Reported on 09/09/2017 06/07/17   Ina Homes, MD  LORazepam (ATIVAN) 0.5 MG tablet Take 1 tablet (0.5 mg total) by mouth every 8 (eight) hours as needed for anxiety. Patient not taking: Reported on 09/09/2017 07/19/17   Mariel Aloe, MD  Multiple Vitamins-Minerals (MULTIVITAMIN PO) Take 1 tablet by mouth daily.    [provider]  multivitamin (RENA-VIT) TABS tablet Take 1 tablet by mouth at bedtime. Patient not taking: Reported on 09/09/2017 09/02/16   Hildred Priest, MD  mupirocin ointment  (BACTROBAN) 2 % Apply 1 application topically daily. 03/06/17   [provider]  Nutritional Supplements (FEEDING SUPPLEMENT, NEPRO CARB STEADY,) LIQD Take 237 mLs by mouth 3 (three) times daily between meals. Patient not taking: Reported on 09/09/2017 02/28/17   Cristal Ford, DO  Oxycodone HCl 10 MG TABS Take  1 tablet (10 mg total) by mouth every 6 (six) hours as needed. Patient taking differently: Take 10 mg by mouth every 6 (six) hours as needed (for pain).  07/19/17   Mariel Aloe, MD  OXYGEN Inhale 4 L into the lungs continuous.    [provider]  pantoprazole (PROTONIX) 40 MG tablet Take 1 tablet (40 mg total) by mouth daily at 6 (six) AM. 07/20/17   Mariel Aloe, MD  polyethylene glycol (MIRALAX / GLYCOLAX) packet Take 17 g by mouth 3 (three) times daily.    [provider]  sertraline (ZOLOFT) 50 MG tablet Take 1 tablet (50 mg total) by mouth daily. Patient not taking: Reported on 09/09/2017 07/19/17   Mariel Aloe, MD  traZODone (DESYREL) 50 MG tablet Take 0.5 tablets (25 mg total) by mouth at bedtime. Patient not taking: Reported on 09/09/2017 07/19/17   Mariel Aloe, MD  vitamin C (ASCORBIC ACID) 500 MG tablet Take 500 mg by mouth daily.    [provider]    Family History Family History  Problem Relation Age of Onset  . Hypertension Mother   . Diabetes Mother   . Hypertension Father   . Diabetes Father     Social History Social History   Tobacco Use  . Smoking status: Current Some Day Smoker    Packs/day: 0.10    Years: 30.00    Pack years: 3.00    Types: Cigarettes    Last attempt to quit: 03/31/2015    Years since quitting: 2.9  . Smokeless tobacco: Never Used  Substance Use Topics  . Alcohol use: Yes    Alcohol/week: 3.0 standard drinks    Types: 3 Standard drinks or equivalent per week    Frequency: Never    Comment: last week  . Drug use: Yes    Types: "Crack" cocaine, Cocaine    Comment: LAST USE YESTERDAY      Allergies   Penicillins; Gabapentin; Lyrica [pregabalin]; Saphris [asenapine]; Tramadol; Adhesive [tape]; Latex; and Other   Review of Systems Review of Systems  Respiratory: Positive for cough and shortness of breath.   Cardiovascular: Positive for chest pain.  Gastrointestinal: Positive for nausea. Negative for vomiting.  Neurological:       Headaches intermittently for over a month.  When the headache comes on she gets tunnel vision in her right eye but it does not remain constant.  All other systems reviewed and are negative.    Physical Exam Updated Vital Signs BP (!) 178/80   Pulse 72   Temp 97.8 F (36.6 C) (Oral)   Resp 16   Ht 5\' 4"  (1.626 m)   Wt 73.7 kg   LMP  (LMP Unknown)   SpO2 93%   BMI 27.89 kg/m   Physical Exam  Constitutional: She is oriented to person, place, and time. She appears well-developed and well-nourished. No distress.  HENT:  Head: Normocephalic and atraumatic.  Mouth/Throat: Oropharynx is clear and moist.  Eyes: Pupils are equal, round, and reactive to light. Conjunctivae and EOM are normal.  Neck: Normal range of motion. Neck supple.  Cardiovascular: Normal rate, regular rhythm and intact distal pulses.  No murmur heard. Pulmonary/Chest: Effort normal. No respiratory distress. She has no wheezes. She has rales in the right lower field and the left lower field.  Abdominal: Soft. She exhibits no distension. There is no tenderness. There is no rebound and no guarding.  Musculoskeletal: Normal range of motion. She exhibits no tenderness.  Right lower leg: She exhibits edema.       Left lower leg: She exhibits edema.  Neurological: She is alert and oriented to person, place, and time.  Skin: Skin is warm and dry. No rash noted. No erythema.  Psychiatric: She has a normal mood and affect. Her behavior is normal.  Nursing note and vitals reviewed.    ED Treatments / Results  Labs (all labs ordered are listed, but only abnormal  results are displayed) Labs Reviewed  BASIC METABOLIC PANEL - Abnormal; Notable for the following components:      Result Value   Sodium 132 (*)    Potassium 6.7 (*)    Chloride 88 (*)    Glucose, Bld 295 (*)    BUN 81 (*)    Creatinine, Ser 9.56 (*)    Calcium 8.0 (*)    GFR calc non Af Amer 4 (*)    GFR calc Af Amer 5 (*)    Anion gap 17 (*)    All other components within normal limits  CBC - Abnormal; Notable for the following components:   WBC 2.5 (*)    RBC 3.43 (*)    Hemoglobin 10.8 (*)    HCT 35.3 (*)    MCV 102.9 (*)    RDW 17.2 (*)    Platelets 99 (*)    All other components within normal limits  I-STAT CHEM 8, ED - Abnormal; Notable for the following components:   Sodium 128 (*)    Potassium 6.4 (*)    Chloride 92 (*)    BUN 72 (*)    Creatinine, Ser 10.00 (*)    Glucose, Bld 294 (*)    Calcium, Ion 0.84 (*)    All other components within normal limits  I-STAT TROPONIN, ED    EKG EKG Interpretation  Date/Time:  Monday March 16 2018 21:13:08 EDT Ventricular Rate:  72 PR Interval:  190 QRS Duration: 98 QT Interval:  468 QTC Calculation: 512 R Axis:   -23 Text Interpretation:  Normal sinus rhythm Nonspecific T wave abnormality Prolonged QT No significant change since last tracing Confirmed by Blanchie Dessert ([id number]) on 03/16/2018 10:35:12 PM   Radiology Dg Chest 2 View  Result Date: 03/16/2018 CLINICAL DATA:  Chest pain short of breath weakness EXAM: CHEST - 2 VIEW COMPARISON:  08/07/2017, 07/16/2017 FINDINGS: No significant pleural effusion. Cardiomegaly with vascular congestion and mild pulmonary edema. No focal airspace disease. No pneumothorax. IMPRESSION: Cardiomegaly with vascular congestion and minimal interstitial edema. Electronically Signed   By: Donavan Foil M.D.   On: 03/16/2018 21:31    Procedures Procedures (including critical care time)  Medications Ordered in ED Medications - No data to display   Initial Impression / Assessment  and Plan / ED Course  I have reviewed the triage vital signs and the nursing notes.  Pertinent labs & imaging results that were available during my care of the patient were reviewed by me and considered in my medical decision making (see chart for details).     Patient presenting today with end-stage renal disease who has missed dialysis.  Patient is fluid overloaded and hyperkalemic today.  She has no changes on her EKG but feel that she needs dialysis prior to discharge back to her nursing facility.  Patient has a potassium of 6.4 and evidence of fluid overload on x-ray and on exam.  Spoke with nephrology who will plan on dialyzing the patient.  They requested that patient be admitted to the  hospitalist service.  Final Clinical Impressions(s) / ED Diagnoses   Final diagnoses:  Chest pain, unspecified type  Acute hyperkalemia  ESRD on dialysis [facility])  Other hypervolemia    ED Discharge Orders    None       Blanchie Dessert, MD 03/16/18 2256

## 2018-03-16 NOTE — ED Triage Notes (Signed)
Per EMS, pt from [facility]. Pt reports cp, sob, weakness and general malaise that has been intermittent last several days. Increased swelling in legs. Pt missed dialysis on Saturday which has increased sx. Pt does dialysis [facility]. Pt reports these sx happen when she misses dialysis. EMS gave 324 asa and 1 nitro in which cp almost completely resolved with 1 nitro.   EMS VS BP 190/100, HR 74 sinus, R 16, 98% room air. CBG 309.

## 2018-03-17 ENCOUNTER — Encounter (HOSPITAL_COMMUNITY): Payer: Self-pay | Admitting: Internal Medicine

## 2018-03-17 ENCOUNTER — Other Ambulatory Visit: Payer: Self-pay

## 2018-03-17 DIAGNOSIS — E875 Hyperkalemia: Secondary | ICD-10-CM | POA: Diagnosis not present

## 2018-03-17 LAB — CBC
HCT: 33.1 % — ABNORMAL LOW (ref 36.0–46.0)
HCT: 35.9 % — ABNORMAL LOW (ref 36.0–46.0)
HEMOGLOBIN: 10.3 g/dL — AB (ref 12.0–15.0)
Hemoglobin: 10.8 g/dL — ABNORMAL LOW (ref 12.0–15.0)
MCH: 31.8 pg (ref 26.0–34.0)
MCH: 31.1 pg (ref 26.0–34.0)
MCHC: 31.1 g/dL (ref 30.0–36.0)
MCHC: 30.1 g/dL (ref 30.0–36.0)
MCV: 102.2 fL — AB (ref 78.0–100.0)
MCV: 103.5 fL — ABNORMAL HIGH (ref 78.0–100.0)
Platelets: 86 10*3/uL — ABNORMAL LOW (ref 150–400)
Platelets: 96 10*3/uL — ABNORMAL LOW (ref 150–400)
RBC: 3.24 MIL/uL — AB (ref 3.87–5.11)
RBC: 3.47 MIL/uL — ABNORMAL LOW (ref 3.87–5.11)
RDW: 17.1 % — ABNORMAL HIGH (ref 11.5–15.5)
RDW: 17.2 % — ABNORMAL HIGH (ref 11.5–15.5)
WBC: 2.6 10*3/uL — ABNORMAL LOW (ref 4.0–10.5)
WBC: 2.6 K/uL — ABNORMAL LOW (ref 4.0–10.5)

## 2018-03-17 LAB — COMPREHENSIVE METABOLIC PANEL
ALT: 37 U/L (ref 0–44)
ANION GAP: 14 (ref 5–15)
AST: 37 U/L (ref 15–41)
Albumin: 3 g/dL — ABNORMAL LOW (ref 3.5–5.0)
Alkaline Phosphatase: 213 U/L — ABNORMAL HIGH (ref 38–126)
BUN: 41 mg/dL — ABNORMAL HIGH (ref 6–20)
CHLORIDE: 93 mmol/L — AB (ref 98–111)
CO2: 27 mmol/L (ref 22–32)
Calcium: 8.2 mg/dL — ABNORMAL LOW (ref 8.9–10.3)
Creatinine, Ser: 6.33 mg/dL — ABNORMAL HIGH (ref 0.44–1.00)
GFR calc non Af Amer: 7 mL/min — ABNORMAL LOW (ref >60)
GFR, EST AFRICAN AMERICAN: 8 mL/min — AB (ref >60)
Glucose, Bld: 365 mg/dL — ABNORMAL HIGH (ref 70–99)
Potassium: 4.7 mmol/L (ref 3.5–5.1)
SODIUM: 134 mmol/L — AB (ref 135–145)
Total Bilirubin: 1 mg/dL (ref 0.3–1.2)
Total Protein: 7.2 g/dL (ref 6.5–8.1)

## 2018-03-17 LAB — GLUCOSE, CAPILLARY
GLUCOSE-CAPILLARY: 280 mg/dL — AB (ref 70–99)
GLUCOSE-CAPILLARY: 188 mg/dL — AB (ref 70–99)
GLUCOSE-CAPILLARY: 287 mg/dL — AB (ref 70–99)
Glucose-Capillary: 405 mg/dL — ABNORMAL HIGH (ref 70–99)

## 2018-03-17 LAB — RENAL FUNCTION PANEL
Albumin: 3.4 g/dL — ABNORMAL LOW (ref 3.5–5.0)
Anion gap: 13 (ref 5–15)
BUN: 48 mg/dL — ABNORMAL HIGH (ref 6–20)
CO2: 31 mmol/L (ref 22–32)
Calcium: 8.6 mg/dL — ABNORMAL LOW (ref 8.9–10.3)
Chloride: 91 mmol/L — ABNORMAL LOW (ref 98–111)
Creatinine, Ser: 6.82 mg/dL — ABNORMAL HIGH (ref 0.44–1.00)
GFR calc Af Amer: 7 mL/min — ABNORMAL LOW (ref >60)
GFR calc non Af Amer: 6 mL/min — ABNORMAL LOW (ref >60)
Glucose, Bld: 162 mg/dL — ABNORMAL HIGH (ref 70–99)
Phosphorus: 6 mg/dL — ABNORMAL HIGH (ref 2.5–4.6)
Potassium: 4.7 mmol/L (ref 3.5–5.1)
Sodium: 135 mmol/L (ref 135–145)

## 2018-03-17 LAB — MRSA PCR SCREENING: MRSA BY PCR: NEGATIVE

## 2018-03-17 LAB — HEMOGLOBIN A1C
HEMOGLOBIN A1C: 6.1 % — AB (ref 4.8–5.6)
MEAN PLASMA GLUCOSE: 128.37 mg/dL

## 2018-03-17 LAB — HIV ANTIBODY (ROUTINE TESTING W REFLEX): HIV Screen 4th Generation wRfx: NONREACTIVE

## 2018-03-17 MED ORDER — INSULIN ASPART 100 UNIT/ML ~~LOC~~ SOLN
0.0000 [IU] | Freq: Three times a day (TID) | SUBCUTANEOUS | Status: DC
  Administered 2018-03-17: 9 [IU] via SUBCUTANEOUS

## 2018-03-17 MED ORDER — ALTEPLASE 2 MG IJ SOLR: 2.0000 mg | Freq: Once | INTRAMUSCULAR | Status: DC | PRN

## 2018-03-17 MED ORDER — INSULIN DETEMIR 100 UNIT/ML ~~LOC~~ SOLN
20.0000 [IU] | Freq: Every day | SUBCUTANEOUS | Status: DC
  Administered 2018-03-17 – 2018-03-19 (×3): 20 [IU] via SUBCUTANEOUS
  Filled 2018-03-17 (×3): qty 0.2

## 2018-03-17 MED ORDER — ALBUTEROL SULFATE (2.5 MG/3ML) 0.083% IN NEBU: 2.5000 mg | INHALATION_SOLUTION | RESPIRATORY_TRACT | Status: DC | PRN

## 2018-03-17 MED ORDER — SODIUM CHLORIDE 0.9 % IV SOLN: 100.0000 mL | INTRAVENOUS | Status: DC | PRN

## 2018-03-17 MED ORDER — ATORVASTATIN CALCIUM 40 MG PO TABS
40.0000 mg | ORAL_TABLET | Freq: Every day | ORAL | Status: DC
  Administered 2018-03-17 – 2018-03-18 (×2): 40 mg via ORAL
  Filled 2018-03-17 (×2): qty 1

## 2018-03-17 MED ORDER — AMLODIPINE BESYLATE 10 MG PO TABS
10.0000 mg | ORAL_TABLET | Freq: Every day | ORAL | Status: DC
Start: 2018-03-17 — End: 2018-03-19
  Administered 2018-03-17 – 2018-03-18 (×2): 10 mg via ORAL
  Filled 2018-03-17 (×2): qty 1

## 2018-03-17 MED ORDER — OXYCODONE HCL 5 MG PO TABS
10.0000 mg | ORAL_TABLET | Freq: Four times a day (QID) | ORAL | Status: DC | PRN
  Administered 2018-03-17 – 2018-03-19 (×3): 10 mg via ORAL
  Filled 2018-03-17 (×3): qty 2

## 2018-03-17 MED ORDER — PENTAFLUOROPROP-TETRAFLUOROETH EX AERO
1.0000 "application " | INHALATION_SPRAY | CUTANEOUS | Status: DC | PRN
  Filled 2018-03-17: qty 103.5

## 2018-03-17 MED ORDER — HYDRALAZINE HCL 20 MG/ML IJ SOLN
10.0000 mg | Freq: Four times a day (QID) | INTRAMUSCULAR | Status: DC | PRN
  Administered 2018-03-17 – 2018-03-19 (×3): 10 mg via INTRAVENOUS
  Filled 2018-03-17 (×3): qty 1

## 2018-03-17 MED ORDER — LIDOCAINE-PRILOCAINE 2.5-2.5 % EX CREA
1.0000 "application " | TOPICAL_CREAM | CUTANEOUS | Status: DC | PRN
  Filled 2018-03-17: qty 5

## 2018-03-17 MED ORDER — CLONIDINE HCL 0.1 MG PO TABS
0.1000 mg | ORAL_TABLET | Freq: Three times a day (TID) | ORAL | Status: DC
  Administered 2018-03-17 – 2018-03-19 (×6): 0.1 mg via ORAL
  Filled 2018-03-17 (×7): qty 1

## 2018-03-17 MED ORDER — CARVEDILOL 25 MG PO TABS
25.0000 mg | ORAL_TABLET | Freq: Two times a day (BID) | ORAL | Status: DC
  Administered 2018-03-17 – 2018-03-18 (×3): 25 mg via ORAL
  Filled 2018-03-17 (×3): qty 1

## 2018-03-17 MED ORDER — HEPARIN SODIUM (PORCINE) 1000 UNIT/ML DIALYSIS
1000.0000 [IU] | INTRAMUSCULAR | Status: DC | PRN
  Filled 2018-03-17: qty 1

## 2018-03-17 MED ORDER — INSULIN ASPART 100 UNIT/ML ~~LOC~~ SOLN
0.0000 [IU] | Freq: Every day | SUBCUTANEOUS | Status: DC
  Administered 2018-03-17 – 2018-03-18 (×2): 3 [IU] via SUBCUTANEOUS

## 2018-03-17 MED ORDER — INSULIN ASPART 100 UNIT/ML ~~LOC~~ SOLN
0.0000 [IU] | Freq: Three times a day (TID) | SUBCUTANEOUS | Status: DC
  Administered 2018-03-17: 2 [IU] via SUBCUTANEOUS
  Administered 2018-03-18: 5 [IU] via SUBCUTANEOUS
  Administered 2018-03-18: 7 [IU] via SUBCUTANEOUS
  Administered 2018-03-18: 2 [IU] via SUBCUTANEOUS

## 2018-03-17 MED ORDER — LIDOCAINE HCL (PF) 1 % IJ SOLN: 5.0000 mL | INTRAMUSCULAR | Status: DC | PRN

## 2018-03-17 MED ORDER — ALLOPURINOL 100 MG PO TABS
100.0000 mg | ORAL_TABLET | ORAL | Status: DC
  Administered 2018-03-17: 100 mg via ORAL
  Filled 2018-03-17: qty 1

## 2018-03-17 MED ORDER — HYDRALAZINE HCL 20 MG/ML IJ SOLN
INTRAMUSCULAR | Status: AC
Start: 2018-03-17 — End: 2018-03-17
  Administered 2018-03-17: 10 mg via INTRAVENOUS
  Filled 2018-03-17: qty 1

## 2018-03-17 MED ORDER — INSULIN ASPART 100 UNIT/ML ~~LOC~~ SOLN
3.0000 [IU] | Freq: Three times a day (TID) | SUBCUTANEOUS | Status: DC
  Administered 2018-03-17 – 2018-03-18 (×2): 3 [IU] via SUBCUTANEOUS

## 2018-03-17 NOTE — Progress Notes (Signed)
HD tx initiated via 15Gx2 w/o problem, pull/push/flush well w/o problem, VSS w/ increased bp, will cont to monitor while on HD tx

## 2018-03-17 NOTE — Clinical Social Work Note (Signed)
Clinical Social Work Assessment  Patient Details  Name: [name] MRN: [id number] Date of Birth: 13-Dec-1965  Date of referral:  03/17/18               Reason for consult:  Discharge Planning                Permission sought to share information with:  Facility Art therapist granted to share information::     Name::        Agency::  [facility]  Relationship::     Contact Information:     Housing/Transportation Living arrangements for the past 2 months:  [place] of Information:  Patient, Medical Team, Facility Patient Interpreter Needed:  None Criminal Activity/Legal Involvement Pertinent to Current Situation/Hospitalization:  No - Comment as needed Significant Relationships:  Adult Children Lives with:  Facility Resident Do you feel safe going back to the place where you live?  Yes Need for family participation in patient care:  Yes (Comment)  Care giving concerns:  Patient is a long-term resident at [facility].   Social Worker assessment / plan:  CSW met with patient. No supports at bedside. CSW introduced role and explained that discharge planning would be discussed. Patient confirmed she is a resident at [facility] and plans to return at discharge. No further concerns. CSW encouraged patient to contact CSW as needed. CSW will continue to follow patient for support and facilitate discharge back to SNF once medically stable.  Employment status:  Disabled (Comment on whether or not currently receiving Disability) Insurance information:  Medicare PT Recommendations:  Not assessed at this time Information / Referral to community resources:  [place]  Patient/Family's Response to care:  Patient agreeable to return to SNF. Patient's son supportive and involved in patient's care. Patient appreciated social work intervention.  Patient/Family's Understanding of and Emotional Response to Diagnosis, Current  Treatment, and Prognosis:  Patient has a good understanding of the reason for admission and plan to return to SNF at discharge. Patient appears happy with hospital care.  Emotional Assessment Appearance:  Appears stated age Attitude/Demeanor/Rapport:  Engaged, Gracious Affect (typically observed):  Accepting, Appropriate, Calm, Pleasant Orientation:  Oriented to Self, Oriented to Place, Oriented to  Time, Oriented to Situation Alcohol / Substance use:  Never Used Psych involvement (Current and /or in the community):  No (Comment)  Discharge Needs  Concerns to be addressed:  Care Coordination Readmission within the last 30 days:  No Current discharge risk:  None Barriers to Discharge:  Continued Medical Work up   Candie Chroman, LCSW 03/17/2018, 3:23 PM

## 2018-03-17 NOTE — NC FL2 (Signed)
[place] LEVEL OF CARE SCREENING TOOL     IDENTIFICATION  Patient Name: [name] Birthdate: [birth year] Sex: female Admission Date (Current Location): 03/16/2018  [facility] and [place] Number:  Herbalist and Address:  [facility] [place] [address] [place], [place] [address]      Provider Number: [id number]  Attending Physician Name and Address:  Robbie Lis, MD  Relative Name and Phone Number:       Current Level of Care: Hospital Recommended Level of Care: [place] Prior Approval Number:    Date Approved/Denied:   PASRR Number: [id number]  Discharge Plan: SNF    Current Diagnoses: Patient Active Problem List   Diagnosis Date Noted  . Localized edema due to fluid overload 09/05/2017  . Lower GI bleed   . Evaluation by psychiatric service required 07/17/2017  . Volume overload 06/03/2017  . Right supracondylar humerus fracture, with nonunion, subsequent encounter 05/01/2017  . Malnutrition of moderate degree 02/28/2017  . Leg pain, left 02/25/2017  . Left leg pain 02/25/2017  . External hemorrhoids   . Diverticulosis of colon without hemorrhage   . Goals of care, counseling/discussion   . Palliative care encounter   . Cellulitis of left leg 01/16/2017  . Diabetes mellitus with complication ([place]) 27/10/5007  . Upper GI bleed 01/16/2017  . UGIB (upper gastrointestinal bleed)   . Anemia due to chronic kidney disease   . Cellulitis of left thigh 01/01/2017  . End stage renal disease ([place]) 01/01/2017  . Cocaine use disorder, severe, dependence ([place]) 08/29/2016  . Hepatitis C 08/29/2016  . Diabetic ulcer of calf ([place]) 08/29/2016  . Diabetic ulcer of toe ([place]) 08/29/2016  . Cerebrovascular accident (CVA) ([place]) 08/29/2016  . MDD (major depressive disorder) 08/28/2016  . Dyslipidemia associated with type 2 diabetes mellitus ([place]) 11/10/2015  . Hyperkalemia 09/17/2015  . History of vitamin D  deficiency 06/20/2015  . Long term prescription opiate use 06/20/2015  . Chronic pain syndrome 06/19/2015  . Anemia of chronic kidney failure 05/01/2015  . transmetatarsal amputation of the left foot 12/13/2014  . Unilateral visual loss 11/22/2014  . 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 ([place]) 11/22/2014  . Essential hypertension 11/09/2014  . Dizziness   . Chronic combined systolic (congestive) and diastolic (congestive) heart failure ([place])   . Rectal bleeding 04/23/2014  . Anemia, iron deficiency 04/13/2014  . Noncompliance 02/02/2014  . Hyponatremia 05/19/2013  . Seizure disorder ([place]) 02/09/2013    Orientation RESPIRATION BLADDER Height & Weight     Self, Time, Situation, Place  Normal Continent Weight: 171 lb 8 oz (77.8 kg)(scale C) Height:  5\' 4"  (162.6 cm)  BEHAVIORAL SYMPTOMS/MOOD NEUROLOGICAL BOWEL NUTRITION STATUS  (None) Convulsions/Seizures Continent Diet(Renal/carb modified with fluid restriction 1200 mL.)  AMBULATORY STATUS COMMUNICATION OF NEEDS Skin     Verbally Normal                       Personal Care Assistance Level of Assistance              Functional Limitations Info  Sight, Hearing, Speech Sight Info: Adequate Hearing Info: Adequate Speech Info: Adequate    SPECIAL CARE FACTORS FREQUENCY                       Contractures Contractures Info: Not present    Additional Factors Info  Code Status, Allergies Code Status Info: Full code Allergies  Info: Penicillins, Gabapentin, Lyrica (Pregabalin), Saphris (Asenapine), Tramadol, Adhesive (Tape), Latex, Other           Current Medications (03/17/2018):  This is the current hospital active medication list Current Facility-Administered Medications  Medication Dose Route Frequency Provider Last Rate Last Dose  . 0.9 %  sodium chloride infusion  100 mL Intravenous PRN Rosita Fire, MD      . 0.9 %  sodium chloride infusion  100 mL Intravenous PRN Rosita Fire, MD      . 0.9 %  sodium chloride infusion  250 mL Intravenous PRN Jani Gravel, MD      . 0.9 %  sodium chloride infusion  100 mL Intravenous PRN Valentina Gu, NP      . 0.9 %  sodium chloride infusion  100 mL Intravenous PRN Valentina Gu, NP      . acetaminophen (TYLENOL) tablet 650 mg  650 mg Oral Q6H PRN Jani Gravel, MD   650 mg at 03/17/18 4801   Or  . acetaminophen (TYLENOL) suppository 650 mg  650 mg Rectal Q6H PRN Jani Gravel, MD      . albuterol (PROVENTIL) (2.5 MG/3ML) 0.083% nebulizer solution 2.5 mg  2.5 mg Nebulization Q4H PRN Kirby-Graham, Karsten Fells, NP      . allopurinol (ZYLOPRIM) tablet 100 mg  100 mg Oral Q T,Th,Sat-1800 Jani Gravel, MD      . alteplase (CATHFLO ACTIVASE) injection 2 mg  2 mg Intracatheter Once PRN Rosita Fire, MD      . amLODipine (NORVASC) tablet 10 mg  10 mg Oral QHS Jani Gravel, MD      . atorvastatin (LIPITOR) tablet 40 mg  40 mg Oral Loma Sousa, MD      . carvedilol (COREG) tablet 25 mg  25 mg Oral BID WC Jani Gravel, MD   25 mg at 03/17/18 0654  . Chlorhexidine Gluconate Cloth 2 % PADS 6 each  6 each Topical Q0600 Rosita Fire, MD      . cloNIDine (CATAPRES) tablet 0.1 mg  0.1 mg Oral Q8H Jani Gravel, MD   0.1 mg at 03/17/18 0654  . heparin injection 1,000 Units  1,000 Units Dialysis PRN Rosita Fire, MD      . heparin injection 5,000 Units  5,000 Units Subcutaneous Camelia Phenes Jani Gravel, MD   5,000 Units at 03/17/18 1428  . hydrALAZINE (APRESOLINE) injection 10 mg  10 mg Intravenous Q6H PRN Jani Gravel, MD   10 mg at 03/17/18 0137  . insulin aspart (novoLOG) injection 0-5 Units  0-5 Units Subcutaneous QHS Robbie Lis, MD      . insulin aspart (novoLOG) injection 0-9 Units  0-9 Units Subcutaneous TID WC Robbie Lis, MD   2 Units at 03/17/18 1230  . insulin aspart (novoLOG) injection 3 Units  3 Units Subcutaneous TID WC Robbie Lis, MD   3 Units at 03/17/18 1230  . insulin detemir (LEVEMIR) injection 20 Units   20 Units Subcutaneous Daily Robbie Lis, MD   20 Units at 03/17/18 1259  . lidocaine (PF) (XYLOCAINE) 1 % injection 5 mL  5 mL Intradermal PRN Rosita Fire, MD      . lidocaine-prilocaine (EMLA) cream 1 application  1 application Topical PRN Rosita Fire, MD      . oxyCODONE (Oxy IR/ROXICODONE) immediate release tablet 10 mg  10 mg Oral Q6H PRN Jani Gravel, MD   10 mg at 03/17/18 0655  .  pentafluoroprop-tetrafluoroeth (GEBAUERS) aerosol 1 application  1 application Topical PRN Rosita Fire, MD      . sodium chloride flush (NS) 0.9 % injection 3 mL  3 mL Intravenous Q12H Jani Gravel, MD   3 mL at 03/17/18 0854  . sodium chloride flush (NS) 0.9 % injection 3 mL  3 mL Intravenous PRN Jani Gravel, MD         Discharge Medications: Please see discharge summary for a list of discharge medications.  Relevant Imaging Results:  Relevant Lab Results:   Additional Information SS#: [id number]. HD TTS [facility]  Candie Chroman, LCSW

## 2018-03-17 NOTE — Progress Notes (Signed)
HD tx completed @ 5436 w/o problem, UF goal met, blood rinsed back, VSS, report called to Suzette Battiest, RN

## 2018-03-17 NOTE — ED Notes (Signed)
Tory RN from Dialysis states calcium gluconate cannot be infusing while pt is in dialysis.

## 2018-03-17 NOTE — Progress Notes (Addendum)
Inpatient Diabetes Program Recommendations  AACE/ADA: New Consensus Statement on Inpatient Glycemic Control (2015)  Target Ranges:  Prepandial:   less than 140 mg/dL      Peak postprandial:   less than 180 mg/dL (1-2 hours)      Critically ill patients:  140 - 180 mg/dL   Lab Results  Component Value Date   GLUCAP 405 (H) 03/17/2018   HGBA1C 5.9 (H) 08/08/2017    Review of Glycemic ControlResults for RUCHY, WILDRICK (MRN [id number]) as of 03/17/2018 10:49  Ref. Range 03/17/2018 04:09 03/17/2018 07:43  Glucose-Capillary Latest Ref Range: 70 - 99 mg/dL 280 (H) 405 (H)    Diabetes history: Type 2 DM  Outpatient Diabetes medications:  Novolog 7 units tid with meals, Levemir 20 units q HS Current orders for Inpatient glycemic control:  Novolog sensitive tid with meals Inpatient Diabetes Program Recommendations:   Please restart home dose of Levemir 20 units daily.  Also consider restarting Novolog 3 units tid with meals (hold if patient eats less than 50%). Text page sent.  MD gave verbal order for both Levemir and Novolog meal coverage.  Thanks,  Adah Perl, RN, BC-ADM Inpatient Diabetes Coordinator Pager [phone] (8a-5p)

## 2018-03-17 NOTE — Plan of Care (Signed)
  Problem: Safety: Goal: Ability to remain free from injury will improve Outcome: Progressing   

## 2018-03-17 NOTE — Consult Note (Addendum)
[facility] Renal Consultation Note    Indication for Consultation:  Management of ESRD/hemodialysis; anemia, hypertension/volume and secondary hyperparathyroidism PCP:  HPI: [name] is a 52 y.o. female with ESRD on hemodialysis T,Th,S at [facility]. PMH: T2DM, HTN, combined systolic and diastolic HF, Hepatitis C, bipolar disorder, schizoaffective disorder, MRSA, PVD with toe amputations. H/O on noncompliance with hemodialysis but this has greatly improved and has ran full treatments three of four last treatments. Patient is a Jehovah Witness.   Patient missed HD 03/15/2018. She presented to ED yesterday evening with C/O SOB and LE edema. K+ was noted to 6.7, CXR showed cardiomegaly with vascular congestion and mild interstitial edema. She was admitted as observation patient for volume overload/hyperkalemia D/T missed HD. She had urgent HD for hyperkalemia/volume removal last PM. K+ this AM post HD was 4.7. Patient seen this AM and she says she feels like weight is still up, C/O SOB. Post HD weight was higher than pre weight. Rechecked standing weight and it is actually now higher-77.9-7.4 kgs above OP EDW. Will have HD again today for volume removal.   Past Medical History:  Diagnosis Date  . Anemia   . Anxiety   . Bipolar disorder, unspecified ([place]) 02/06/2014  . Chronic combined systolic and diastolic CHF (congestive heart failure) (HCC)    EF 40% by echo and 48% by stress test  . Chronic pain syndrome   . Depression   . ESRD on hemodialysis ([place])   . GERD (gastroesophageal reflux disease)   . Glaucoma   . Headache    sinus headaches  . Hepatitis C   . High cholesterol   . History of hiatal hernia   . History of vitamin D deficiency 06/20/2015  . Hypertension   . MRSA infection ?2014   "on the back of my head; spread throughout my bloodstream"  . Neuropathy   . Refusal of blood transfusions as patient is Jehovah's Witness   . Schizoaffective  disorder, unspecified condition 02/08/2014  . Seizure [facility]) dx'd 2014   "don't know what kind; last one was ~ 04/2014"  . Stroke [facility])    TIA 2009  . TIA (transient ischemic attack) 2010  . Type II diabetes mellitus ([place])    INSULIN DEPENDENT   Past Surgical History:  Procedure Laterality Date  . ABDOMINAL HYSTERECTOMY  2014  . ABDOMINAL HYSTERECTOMY  2010  . AMPUTATION Left 05/21/2013   Procedure: Left Midfoot AMPUTATION;  Surgeon: Newt Minion, MD;  Location: [place];  Service: Orthopedics;  Laterality: Left;  Left Midfoot Amputation  . AV FISTULA PLACEMENT Left 05/04/2015   Procedure: ARTERIOVENOUS (AV) GRAFT INSERTION;  Surgeon: Angelia Mould, MD;  Location: [place];  Service: Vascular;  Laterality: Left;  . COLONOSCOPY WITH PROPOFOL N/A 06/22/2013   Procedure: COLONOSCOPY WITH PROPOFOL;  Surgeon: Jeryl Columbia, MD;  Location: [facility] ENDOSCOPY;  Service: Endoscopy;  Laterality: N/A;  . ESOPHAGOGASTRODUODENOSCOPY N/A 05/11/2015   Procedure: ESOPHAGOGASTRODUODENOSCOPY (EGD);  Surgeon: Laurence Spates, MD;  Location: [facility] ENDOSCOPY;  Service: Endoscopy;  Laterality: N/A;  . EYE SURGERY Bilateral 2010   Lasik  . FLEXIBLE SIGMOIDOSCOPY N/A 01/17/2017   Procedure: FLEXIBLE SIGMOIDOSCOPY w/ hemorrhoid banding possible;  Surgeon: Gatha Mayer, MD;  Location: [facility] ENDOSCOPY;  Service: Endoscopy;  Laterality: N/A;  . FOOT AMPUTATION Left    due diabetic neuropathy, could not feel ulcer on bottom of foot  . REFRACTIVE SURGERY Bilateral 2013  . TONSILLECTOMY  1970's  . TUBAL LIGATION  2000   Family History  Problem Relation Age of Onset  . Hypertension Mother   . Diabetes Mother   . Hypertension Father   . Diabetes Father    Social History:  reports that she has been smoking cigarettes. She has a 3.00 pack-year smoking history. She has never used smokeless tobacco. She reports that she drinks about 3.0 standard drinks of alcohol per week. She reports that she has current or past drug history.  Drugs: "Crack" cocaine and Cocaine. Allergies  Allergen Reactions  . Penicillins Hives and Other (See Comments)    Has patient had a PCN reaction causing immediate rash, facial/tongue/throat swelling, SOB or lightheadedness with hypotension: Yes Has patient had a PCN reaction causing severe rash involving mucus membranes or skin necrosis: No Has patient had a PCN reaction that required hospitalization No Has patient had a PCN reaction occurring within the last 10 years: No If all of the above answers are "NO", then may proceed with Cephalosporin use. Pt tolerates cefepime and ceftriaxone  . Gabapentin Itching and Swelling  . Lyrica [Pregabalin] Swelling and Other (See Comments)    Facial and leg swelling  . Saphris [Asenapine] Swelling and Other (See Comments)    Facial swelling  . Tramadol Swelling and Other (See Comments)    Leg swelling  . Adhesive [Tape] Itching    SEVERE ITCHING  . Latex Itching    SEVERE ITCHING  . Other Other (See Comments)    NO BLOOD PRODUCTS; PATIENT IS A JEHOVAH'S WITNESS   Prior to Admission medications   Medication Sig Start Date End Date Taking? Authorizing Provider  calcium acetate (PHOSLO) 667 MG capsule Take 2 capsules (1,334 mg total) by mouth 3 (three) times daily with meals. 09/02/16  Yes Hildred Priest, MD  camphor-menthol [facility]) lotion Apply 1 application topically as needed for itching.   Yes [provider]  carvedilol (COREG) 25 MG tablet Take 1 tablet (25 mg total) by mouth 2 (two) times daily with a meal. 09/02/16  Yes Hildred Priest, MD  docusate sodium (COLACE) 100 MG capsule Take 100 mg by mouth 2 (two) times daily.   Yes [provider]  hydrocortisone (ANUSOL-HC) 2.5 % rectal cream Place rectally 2 (two) times daily. Patient taking differently: Place 1 application rectally 2 (two) times daily as needed for hemorrhoids.  01/24/17  Yes Nita Sells, MD  hydrOXYzine (ATARAX/VISTARIL) 25 MG  tablet Take 25 mg by mouth 2 (two) times daily as needed for itching.   Yes [provider]  LORazepam (ATIVAN) 0.5 MG tablet Take 1 tablet (0.5 mg total) by mouth every 8 (eight) hours as needed for anxiety. Patient taking differently: Take 0.25 mg by mouth 2 (two) times daily.  07/19/17  Yes Mariel Aloe, MD  sucroferric oxyhydroxide (VELPHORO) 500 MG chewable tablet Chew 1,000 mg by mouth See admin instructions. Take 2 tablets by mouth for 3 times daily and 1 tablet daily as needed with snacks   Yes [provider]  albuterol (PROAIR HFA) 108 (90 Base) MCG/ACT inhaler Inhale 2 puffs into the lungs every 4 (four) hours as needed for wheezing or shortness of breath.    [provider]  albuterol (PROVENTIL HFA;VENTOLIN HFA) 108 (90 Base) MCG/ACT inhaler Inhale 2 puffs into the lungs every 4 (four) hours as needed for wheezing or shortness of breath. Patient not taking: Reported on 09/09/2017 10/15/16   Horton, Barbette Hair, MD  albuterol (PROVENTIL) (2.5 MG/3ML) 0.083% nebulizer solution Take 2.5 mg by nebulization  every 6 (six) hours as needed for wheezing or shortness of breath.    [provider]  allopurinol (ZYLOPRIM) 100 MG tablet Take 1 tablet (100 mg total) by mouth every Tuesday, Thursday, and Saturday at 6 PM. 01/25/17   Nita Sells, MD  Amino Acids-Protein Hydrolys (PRO-STAT) LIQD Take 30 mLs by mouth 2 (two) times daily.    [provider]  amLODipine (NORVASC) 10 MG tablet Take 1 tablet (10 mg total) by mouth at bedtime. 09/02/16   Hildred Priest, MD  atorvastatin (LIPITOR) 40 MG tablet Take 1 tablet (40 mg total) by mouth at bedtime. 09/02/16   Hildred Priest, MD  bisacodyl (DULCOLAX) 10 MG suppository Place 10 mg rectally daily as needed (for constipation).    [provider]  cloNIDine (CATAPRES) 0.1 MG tablet Take 1 tablet (0.1 mg total) by mouth every 8 (eight) hours. 09/02/16   Hildred Priest, MD  diphenhydrAMINE (BENADRYL) 25 mg capsule Take 25 mg by mouth every 6 (six) hours as needed for itching.     [provider]  DULoxetine (CYMBALTA) 30 MG capsule Take 30 mg by mouth daily.    [provider]  Ferrous Sulfate (SLOW FE) 142 (45 Fe) MG TBCR Take 1 tablet by mouth daily. Patient not taking: Reported on 09/09/2017 07/19/17   Mariel Aloe, MD  ferrous sulfate 325 (65 FE) MG tablet Take 325 mg by mouth daily.    [provider]  insulin aspart (NOVOLOG) 100 UNIT/ML injection Inject 7 Units into the skin 3 (three) times daily with meals. Patient taking differently: Inject 7 Units into the skin See admin instructions. 7 units into the skin three times a day before meals, plus a sliding scale: BGL 101-150 = 2 units; 151-200 = 3 units; 201-250 = 5 units; 251-300 = 7 units; 301-350 = 9 units; >350 = 11 units; >400 or <60 = CALL MD 06/07/17   Ina Homes, MD  Insulin Detemir (LEVEMIR FLEXTOUCH) 100 UNIT/ML Pen Inject 20 Units into the skin at bedtime.    [provider]  insulin glargine (LANTUS) 100 UNIT/ML injection Inject 0.2 mLs (20 Units total) into the skin at bedtime. Patient not taking: Reported on 09/09/2017 06/07/17   Ina Homes, MD  Multiple Vitamins-Minerals (MULTIVITAMIN PO) Take 1 tablet by mouth daily.    [provider]  multivitamin (RENA-VIT) TABS tablet Take 1 tablet by mouth at bedtime. Patient not taking: Reported on 09/09/2017 09/02/16   Hildred Priest, MD  mupirocin ointment (BACTROBAN) 2 % Apply 1 application topically daily. 03/06/17   [provider]  Nutritional Supplements (FEEDING SUPPLEMENT, NEPRO CARB STEADY,) LIQD Take 237 mLs by mouth 3 (three) times daily between meals. Patient not taking: Reported on 09/09/2017 02/28/17   Cristal Ford, DO  Oxycodone HCl 10 MG TABS Take 1 tablet (10 mg total) by mouth every 6 (six) hours as needed. Patient taking differently: Take 10 mg by mouth  every 6 (six) hours as needed (for pain).  07/19/17   Mariel Aloe, MD  OXYGEN Inhale 4 L into the lungs continuous.    [provider]  pantoprazole (PROTONIX) 40 MG tablet Take 1 tablet (40 mg total) by mouth daily at 6 (six) AM. 07/20/17   Mariel Aloe, MD  polyethylene glycol (MIRALAX / GLYCOLAX) packet Take 17 g by mouth 3 (three) times daily.    [provider]  sertraline (ZOLOFT) 50 MG tablet Take 1 tablet (50 mg total) by mouth daily. Patient not  taking: Reported on 09/09/2017 07/19/17   Mariel Aloe, MD  traZODone (DESYREL) 50 MG tablet Take 0.5 tablets (25 mg total) by mouth at bedtime. Patient not taking: Reported on 09/09/2017 07/19/17   Mariel Aloe, MD  vitamin C (ASCORBIC ACID) 500 MG tablet Take 500 mg by mouth daily.    [provider]   Current Facility-Administered Medications  Medication Dose Route Frequency Provider Last Rate Last Dose  . 0.9 %  sodium chloride infusion  100 mL Intravenous PRN Rosita Fire, MD      . 0.9 %  sodium chloride infusion  100 mL Intravenous PRN Rosita Fire, MD      . 0.9 %  sodium chloride infusion  250 mL Intravenous PRN Jani Gravel, MD      . acetaminophen (TYLENOL) tablet 650 mg  650 mg Oral Q6H PRN Jani Gravel, MD   650 mg at 03/17/18 1740   Or  . acetaminophen (TYLENOL) suppository 650 mg  650 mg Rectal Q6H PRN Jani Gravel, MD      . albuterol (PROVENTIL) (2.5 MG/3ML) 0.083% nebulizer solution 2.5 mg  2.5 mg Nebulization Q4H PRN Kirby-Graham, Karsten Fells, NP      . allopurinol (ZYLOPRIM) tablet 100 mg  100 mg Oral Q T,Th,Sat-1800 Jani Gravel, MD      . alteplase (CATHFLO ACTIVASE) injection 2 mg  2 mg Intracatheter Once PRN Rosita Fire, MD      . amLODipine (NORVASC) tablet 10 mg  10 mg Oral QHS Jani Gravel, MD      . atorvastatin (LIPITOR) tablet 40 mg  40 mg Oral Loma Sousa, MD      . carvedilol (COREG) tablet 25 mg  25 mg Oral BID WC Jani Gravel, MD   25 mg at 03/17/18 0654  .  Chlorhexidine Gluconate Cloth 2 % PADS 6 each  6 each Topical Q0600 Rosita Fire, MD      . cloNIDine (CATAPRES) tablet 0.1 mg  0.1 mg Oral Q8H Jani Gravel, MD   0.1 mg at 03/17/18 0654  . heparin injection 1,000 Units  1,000 Units Dialysis PRN Rosita Fire, MD      . heparin injection 5,000 Units  5,000 Units Subcutaneous Q8H Jani Gravel, MD   5,000 Units at 03/17/18 0511  . hydrALAZINE (APRESOLINE) injection 10 mg  10 mg Intravenous Q6H PRN Jani Gravel, MD   10 mg at 03/17/18 0137  . insulin aspart (novoLOG) injection 0-5 Units  0-5 Units Subcutaneous QHS Robbie Lis, MD      . insulin aspart (novoLOG) injection 0-9 Units  0-9 Units Subcutaneous TID WC Robbie Lis, MD   2 Units at 03/17/18 1230  . insulin aspart (novoLOG) injection 3 Units  3 Units Subcutaneous TID WC Robbie Lis, MD   3 Units at 03/17/18 1230  . insulin detemir (LEVEMIR) injection 20 Units  20 Units Subcutaneous Daily Robbie Lis, MD      . lidocaine (PF) (XYLOCAINE) 1 % injection 5 mL  5 mL Intradermal PRN Rosita Fire, MD      . lidocaine-prilocaine (EMLA) cream 1 application  1 application Topical PRN Rosita Fire, MD      . oxyCODONE (Oxy IR/ROXICODONE) immediate release tablet 10 mg  10 mg Oral Q6H PRN Jani Gravel, MD   10 mg at 03/17/18 0655  . pentafluoroprop-tetrafluoroeth (GEBAUERS) aerosol 1 application  1 application Topical PRN Rosita Fire, MD      .  sodium chloride flush (NS) 0.9 % injection 3 mL  3 mL Intravenous Q12H Jani Gravel, MD   3 mL at 03/17/18 0854  . sodium chloride flush (NS) 0.9 % injection 3 mL  3 mL Intravenous PRN Jani Gravel, MD       Labs: Basic Metabolic Panel: Recent Labs  Lab 03/16/18 2122 03/16/18 2129 03/17/18 0538  NA 132* 128* 134*  K 6.7* 6.4* 4.7  CL 88* 92* 93*  CO2 27  --  27  GLUCOSE 295* 294* 365*  BUN 81* 72* 41*  CREATININE 9.56* 10.00* 6.33*  CALCIUM 8.0*  --  8.2*   Liver Function Tests: Recent Labs  Lab  03/17/18 0538  AST 37  ALT 37  ALKPHOS 213*  BILITOT 1.0  PROT 7.2  ALBUMIN 3.0*   No results for input(s): LIPASE, AMYLASE in the last 168 hours. No results for input(s): AMMONIA in the last 168 hours. CBC: Recent Labs  Lab 03/16/18 2122 03/16/18 2129 03/17/18 0538  WBC 2.5*  --  2.6*  HGB 10.8* 12.2 10.3*  HCT 35.3* 36.0 33.1*  MCV 102.9*  --  102.2*  PLT 99*  --  86*   Cardiac Enzymes: No results for input(s): CKTOTAL, CKMB, CKMBINDEX, TROPONINI in the last 168 hours. CBG: Recent Labs  Lab 03/17/18 0409 03/17/18 0743 03/17/18 1115  GLUCAP 280* 405* 188*   Iron Studies: No results for input(s): IRON, TIBC, TRANSFERRIN, FERRITIN in the last 72 hours. Studies/Results: Dg Chest 2 View  Result Date: 03/16/2018 CLINICAL DATA:  Chest pain short of breath weakness EXAM: CHEST - 2 VIEW COMPARISON:  08/07/2017, 07/16/2017 FINDINGS: No significant pleural effusion. Cardiomegaly with vascular congestion and mild pulmonary edema. No focal airspace disease. No pneumothorax. IMPRESSION: Cardiomegaly with vascular congestion and minimal interstitial edema. Electronically Signed   By: Donavan Foil M.D.   On: 03/16/2018 21:31    ROS: As per HPI otherwise negative.  Physical Exam: Vitals:   03/17/18 0305 03/17/18 0333 03/17/18 0356 03/17/18 1150  BP: (!) 188/86 (!) 165/80 (!) 171/71 (!) 195/83  Pulse: 86 86 89 80  Resp: (!) 22 17 17 20   Temp:  98.2 F (36.8 C) 98.5 F (36.9 C) 98.1 F (36.7 C)  TempSrc:  Oral Oral Oral  SpO2: 99% 100% 90% 100%  Weight:  71.4 kg 76.5 kg   Height:         General: Chronically ill appearing female in no acute distress. Head: Normocephalic, atraumatic, sclera non-icteric, mucus membranes are moist Neck: Supple. JVD not elevated. Lungs: Bilateral breath sounds slightly  Heart: RRR with S1 S2. No murmurs, rubs, or gallops appreciated. Abdomen: Soft, non-tender, non-distended with normoactive bowel sounds. No rebound/guarding. No obvious  abdominal masses. M-S:  Strength and tone appear normal for age. Lower extremities: Edema noted LLE with toe amps L foot. Trace RLE edema.   Neuro: Alert and oriented X 3. Moves all extremities spontaneously. Psych:  Responds to questions appropriately with a normal affect. Dialysis Access: L AVG + bruit  Dialysis Orders: G/O T,Th,S 3.5 hrs 180 NRe 400/Autoflow 2.0 70.5 kg 2.0K/2.0 Ca UFP 4 L AVG -No heparin -Mircera 200 mcg IV q 2 weeks (last dose 03/05/2018 Last HGB 11.0 03/12/18) -Venofer 50 mg IV weekly (last dose 03/12/2018 Last Fe 83 Tsat 32% 02/19/2018)  Assessment/Plan: 1.  Hyperkalemia-Missed HD 03/15/2018. Urgent HD last night. K+ now 4.7 2.  ESRD - T,Th,S G/O. HD off schedule last PM for hyperkalemia/volume excess. Will have HD again today to  get back on schedule.  3.  Hypertension/volume  -HD last PM/early AM-pre wt 73.9 kg Net UF 2.5 liters post wt recorded as 76.5 kg. Rechecked standing wt and it is actually 77.9 kg. Complaining of feeling like she didn't get all fluid off. Will have hemodialysis today. Very hypertensive at present. Attempt 4 liters. Continue amlodipine, clonidine and carvedilol.  4. Anemia  -Patient is Jehovah Witness, does not accept blood transfusions. HGB 10.3 ESA due 03/20/18. Follow HGB.  5.  Metabolic bone disease -  Continue binders, VDRA. Ca 8.2 C Ca 9.0.  6.  Nutrition - Albumin 3.0 Renal/Carb mod diet, renal vit, nepro. 7.  DM-per primary 8. H/O polysubstance abuse 9. H/O Bipolar v. Shizoaffective disorder  Jimmye Norman. Owens Shark, NP-C 03/17/2018, 1:46 PM  [employer] [phone]  Renal Attending: Pt had hyperkalemia after missing HD and also volume overload.  She had emergency HD last night for hyperkalemia. 2.5 liters was removed over 2.5 hours.  We will dialyze again if space available today. [name], MD

## 2018-03-17 NOTE — Progress Notes (Signed)
PROGRESS NOTE    [name]  KDT:[id number] DOB: September 18, 1965 DOA: 03/16/2018  PCP: [name], MD   Brief Narrative:  52 y.o. female with ESRD on hemodialysis T,Th,S (non-compliant), chronic systolic and diastolic CHF, DM, HTN, bipolar disorder and schizophrenia, hepatitis C, jehovah witness. Pt missed HD 03/15/2018 and then presented to ED the following day with worsening shortness of breath and LE edema. CXR showed cardiomegaly. She was also found to have hyperkalemia and underwent urgent HD.   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 405, 188 - Pt on SSI, Novolog 3 units TIDAC and Levemir 20 units daily     End stage renal disease ([place]) - On HD TTS    Anemia of renal disease - Monitor CBC   DVT prophylaxis: Heparin subQ Code Status: full code  Family Communication: no family at bedside this am Disposition Plan: home once cleared by renal team adequately diuresed, still seems fluid overloaded    Consultants:   Renal  DM coordinator   Procedures:   HD  Antimicrobials:   None    Subjective: No overnight events.  Objective: Vitals:   03/17/18 1600 03/17/18 1630 03/17/18 1700 03/17/18 1730  BP: (!) 140/96 140/80 (!) 171/82 (!) 177/79  Pulse: 78 82 81 83  Resp:      Temp:      TempSrc:      SpO2:      Weight:      Height:        Intake/Output Summary (Last 24 hours) at 03/17/2018 1843 Last data filed at 03/17/2018 1407 Gross per 24 hour  Intake 723 ml  Output 2500 ml  Net -1777 ml   Filed Weights   03/17/18 0356 03/17/18 1406 03/17/18 1456  Weight: 76.5 kg 77.8 kg 78.4 kg    Examination:  General exam: Appears calm and comfortable  Respiratory system: no wheezing, no rhonchi  Cardiovascular system: S1 & S2 heard, Rate controlled  Gastrointestinal system:  Abdomen is nondistended, soft and nontender. No organomegaly or masses felt. Normal bowel sounds heard. Central nervous system: Alert and oriented. No focal neurological deficits. Extremities: Symmetric 5 x 5 power. LE edema appreciated  Skin: No rashes, lesions or ulcers Psychiatry: Judgement and insight appear normal. Mood & affect appropriate.   Data Reviewed: I have personally reviewed following labs and imaging studies  CBC: Recent Labs  Lab 03/16/18 2122 03/16/18 2129 03/17/18 0538 03/17/18 1449  WBC 2.5*  --  2.6* 2.6*  HGB 10.8* 12.2 10.3* 10.8*  HCT 35.3* 36.0 33.1* 35.9*  MCV 102.9*  --  102.2* 103.5*  PLT 99*  --  86* 96*   Basic Metabolic Panel: Recent Labs  Lab 03/16/18 2122 03/16/18 2129 03/17/18 0538 03/17/18 1449  NA 132* 128* 134* 135  K 6.7* 6.4* 4.7 4.7  CL 88* 92* 93* 91*  CO2 27  --  27 31  GLUCOSE 295* 294* 365* 162*  BUN 81* 72* 41* 48*  CREATININE 9.56* 10.00* 6.33* 6.82*  CALCIUM 8.0*  --  8.2* 8.6*  PHOS  --   --   --  6.0*   GFR: Estimated Creatinine Clearance: 9.9 mL/min (A) (by C-G formula based on SCr of 6.82 mg/dL (H)). Liver Function Tests: Recent Labs  Lab 03/17/18 0538 03/17/18 1449  AST 37  --  ALT 37  --   ALKPHOS 213*  --   BILITOT 1.0  --   PROT 7.2  --   ALBUMIN 3.0* 3.4*   No results for input(s): LIPASE, AMYLASE in the last 168 hours. No results for input(s): AMMONIA in the last 168 hours. Coagulation Profile: No results for input(s): INR, PROTIME in the last 168 hours. Cardiac Enzymes: No results for input(s): CKTOTAL, CKMB, CKMBINDEX, TROPONINI in the last 168 hours. BNP (last 3 results) No results for input(s): PROBNP in the last 8760 hours. HbA1C: Recent Labs    03/17/18 0823  HGBA1C 6.1*   CBG: Recent Labs  Lab 03/17/18 0409 03/17/18 0743 03/17/18 1115  GLUCAP 280* 405* 188*   Lipid Profile: No results for input(s): CHOL, HDL, LDLCALC, TRIG, CHOLHDL, LDLDIRECT in the last 72 hours. Thyroid  Function Tests: No results for input(s): TSH, T4TOTAL, FREET4, T3FREE, THYROIDAB in the last 72 hours. Anemia Panel: No results for input(s): VITAMINB12, FOLATE, FERRITIN, TIBC, IRON, RETICCTPCT in the last 72 hours. Urine analysis:    Component Value Date/Time   COLORURINE YELLOW 02/25/2017 1224   APPEARANCEUR HAZY (A) 02/25/2017 1224   LABSPEC 1.013 02/25/2017 1224   PHURINE 8.0 02/25/2017 1224   GLUCOSEU >=500 (A) 02/25/2017 1224   HGBUR SMALL (A) 02/25/2017 1224   BILIRUBINUR NEGATIVE 02/25/2017 1224   BILIRUBINUR neg 12/13/2014 1127   KETONESUR NEGATIVE 02/25/2017 1224   PROTEINUR >=300 (A) 02/25/2017 1224   UROBILINOGEN 0.2 05/01/2015 1252   NITRITE NEGATIVE 02/25/2017 1224   LEUKOCYTESUR NEGATIVE 02/25/2017 1224   Sepsis Labs: @LABRCNTIP (procalcitonin:4,lacticidven:4)   ) Recent Results (from the past 240 hour(s))  MRSA PCR Screening     Status: None   Collection Time: 03/17/18  7:03 AM  Result Value Ref Range Status   MRSA by PCR NEGATIVE NEGATIVE Final    Comment:        The GeneXpert MRSA Assay (FDA approved for NASAL specimens only), is one component of a comprehensive MRSA colonization surveillance program. It is not intended to diagnose MRSA infection nor to guide or monitor treatment for MRSA infections. Performed at [facility], [place] [address] [place], [place] [address]       Radiology Studies: Dg Chest 2 View  Result Date: 03/16/2018 CLINICAL DATA:  Chest pain short of breath weakness EXAM: CHEST - 2 VIEW COMPARISON:  08/07/2017, 07/16/2017 FINDINGS: No significant pleural effusion. Cardiomegaly with vascular congestion and mild pulmonary edema. No focal airspace disease. No pneumothorax. IMPRESSION: Cardiomegaly with vascular congestion and minimal interstitial edema. Electronically Signed   By: Donavan Foil M.D.   On: 03/16/2018 21:31       Scheduled Meds: . allopurinol  100 mg Oral Q T,Th,Sat-1800  . amLODipine  10 mg Oral QHS  .  atorvastatin  40 mg Oral QHS  . carvedilol  25 mg Oral BID WC  . cloNIDine  0.1 mg Oral Q8H  . heparin  5,000 Units Subcutaneous Q8H  . insulin aspart  0-5 Units Subcutaneous QHS  . insulin aspart  0-9 Units Subcutaneous TID WC  . insulin aspart  3 Units Subcutaneous TID WC  . insulin detemir  20 Units Subcutaneous Daily   Continuous Infusions: . sodium chloride    . sodium chloride    . sodium chloride    . sodium chloride    . sodium chloride       LOS: 0 days    Time spent: 25 minutes Greater than 50% of the time spent on  counseling and coordinating the care.   Leisa Lenz, MD [facility] Pager [phone]  If 7PM-7AM, please contact night-coverage [email or web address] Password [facility] 03/17/2018, 6:43 PM

## 2018-03-17 NOTE — Progress Notes (Signed)
Patient with complaints of vision changes to her right eye and nose bleeds, denies slurred speech, facial drooping, paralysis, states she has a bad headache on the right side of her head, states that this is not acute and started about 1 month ago, will paged on call MD Neta Mends RN 9:07 PM 03-17-2018

## 2018-03-17 NOTE — Progress Notes (Signed)
Pt. Refused bed alarm. States she will call for assistance when needing to ambulate.

## 2018-03-18 DIAGNOSIS — D631 Anemia in chronic kidney disease: Secondary | ICD-10-CM | POA: Diagnosis present

## 2018-03-18 DIAGNOSIS — D61818 Other pancytopenia: Secondary | ICD-10-CM | POA: Diagnosis present

## 2018-03-18 DIAGNOSIS — F259 Schizoaffective disorder, unspecified: Secondary | ICD-10-CM | POA: Diagnosis present

## 2018-03-18 DIAGNOSIS — F419 Anxiety disorder, unspecified: Secondary | ICD-10-CM | POA: Diagnosis present

## 2018-03-18 DIAGNOSIS — E1122 Type 2 diabetes mellitus with diabetic chronic kidney disease: Secondary | ICD-10-CM | POA: Diagnosis present

## 2018-03-18 DIAGNOSIS — H409 Unspecified glaucoma: Secondary | ICD-10-CM | POA: Diagnosis present

## 2018-03-18 DIAGNOSIS — N2581 Secondary hyperparathyroidism of renal origin: Secondary | ICD-10-CM | POA: Diagnosis present

## 2018-03-18 DIAGNOSIS — E8889 Other specified metabolic disorders: Secondary | ICD-10-CM | POA: Diagnosis present

## 2018-03-18 DIAGNOSIS — I5042 Chronic combined systolic (congestive) and diastolic (congestive) heart failure: Secondary | ICD-10-CM | POA: Diagnosis present

## 2018-03-18 DIAGNOSIS — K219 Gastro-esophageal reflux disease without esophagitis: Secondary | ICD-10-CM | POA: Diagnosis present

## 2018-03-18 DIAGNOSIS — E119 Type 2 diabetes mellitus without complications: Secondary | ICD-10-CM | POA: Diagnosis not present

## 2018-03-18 DIAGNOSIS — Z79899 Other long term (current) drug therapy: Secondary | ICD-10-CM | POA: Diagnosis not present

## 2018-03-18 DIAGNOSIS — E875 Hyperkalemia: Secondary | ICD-10-CM | POA: Diagnosis not present

## 2018-03-18 DIAGNOSIS — F319 Bipolar disorder, unspecified: Secondary | ICD-10-CM | POA: Diagnosis present

## 2018-03-18 DIAGNOSIS — F17210 Nicotine dependence, cigarettes, uncomplicated: Secondary | ICD-10-CM | POA: Diagnosis present

## 2018-03-18 DIAGNOSIS — Z880 Allergy status to penicillin: Secondary | ICD-10-CM | POA: Diagnosis not present

## 2018-03-18 DIAGNOSIS — Z992 Dependence on renal dialysis: Secondary | ICD-10-CM | POA: Diagnosis not present

## 2018-03-18 DIAGNOSIS — N186 End stage renal disease: Secondary | ICD-10-CM | POA: Diagnosis not present

## 2018-03-18 DIAGNOSIS — E1121 Type 2 diabetes mellitus with diabetic nephropathy: Secondary | ICD-10-CM | POA: Diagnosis present

## 2018-03-18 DIAGNOSIS — B1920 Unspecified viral hepatitis C without hepatic coma: Secondary | ICD-10-CM | POA: Diagnosis present

## 2018-03-18 DIAGNOSIS — E785 Hyperlipidemia, unspecified: Secondary | ICD-10-CM | POA: Diagnosis present

## 2018-03-18 DIAGNOSIS — Z888 Allergy status to other drugs, medicaments and biological substances status: Secondary | ICD-10-CM | POA: Diagnosis not present

## 2018-03-18 DIAGNOSIS — E1151 Type 2 diabetes mellitus with diabetic peripheral angiopathy without gangrene: Secondary | ICD-10-CM | POA: Diagnosis present

## 2018-03-18 DIAGNOSIS — I132 Hypertensive heart and chronic kidney disease with heart failure and with stage 5 chronic kidney disease, or end stage renal disease: Secondary | ICD-10-CM | POA: Diagnosis present

## 2018-03-18 DIAGNOSIS — M109 Gout, unspecified: Secondary | ICD-10-CM | POA: Diagnosis present

## 2018-03-18 LAB — CBC
HEMATOCRIT: 34.4 % — AB (ref 36.0–46.0)
Hemoglobin: 10.2 g/dL — ABNORMAL LOW (ref 12.0–15.0)
MCH: 30.9 pg (ref 26.0–34.0)
MCHC: 29.7 g/dL — ABNORMAL LOW (ref 30.0–36.0)
MCV: 104.2 fL — AB (ref 78.0–100.0)
Platelets: 91 10*3/uL — ABNORMAL LOW (ref 150–400)
RBC: 3.3 MIL/uL — ABNORMAL LOW (ref 3.87–5.11)
RDW: 16.9 % — AB (ref 11.5–15.5)
WBC: 2.5 10*3/uL — AB (ref 4.0–10.5)

## 2018-03-18 LAB — RENAL FUNCTION PANEL
Albumin: 3 g/dL — ABNORMAL LOW (ref 3.5–5.0)
Anion gap: 11 (ref 5–15)
BUN: 34 mg/dL — AB (ref 6–20)
CHLORIDE: 100 mmol/L (ref 98–111)
CO2: 26 mmol/L (ref 22–32)
Calcium: 9.1 mg/dL (ref 8.9–10.3)
Creatinine, Ser: 4.69 mg/dL — ABNORMAL HIGH (ref 0.44–1.00)
GFR calc Af Amer: 11 mL/min — ABNORMAL LOW (ref >60)
GFR, EST NON AFRICAN AMERICAN: 10 mL/min — AB (ref >60)
Glucose, Bld: 249 mg/dL — ABNORMAL HIGH (ref 70–99)
POTASSIUM: 4.3 mmol/L (ref 3.5–5.1)
Phosphorus: 5.7 mg/dL — ABNORMAL HIGH (ref 2.5–4.6)
Sodium: 137 mmol/L (ref 135–145)

## 2018-03-18 LAB — GLUCOSE, CAPILLARY
GLUCOSE-CAPILLARY: 269 mg/dL — AB (ref 70–99)
Glucose-Capillary: 257 mg/dL — ABNORMAL HIGH (ref 70–99)
Glucose-Capillary: 307 mg/dL — ABNORMAL HIGH (ref 70–99)
Glucose-Capillary: 178 mg/dL — ABNORMAL HIGH (ref 70–99)

## 2018-03-18 MED ORDER — LIDOCAINE-PRILOCAINE 2.5-2.5 % EX CREA: 1.0000 "application " | TOPICAL_CREAM | CUTANEOUS | Status: DC | PRN

## 2018-03-18 MED ORDER — ALTEPLASE 2 MG IJ SOLR: 2.0000 mg | Freq: Once | INTRAMUSCULAR | Status: DC | PRN

## 2018-03-18 MED ORDER — PENTAFLUOROPROP-TETRAFLUOROETH EX AERO: 1.0000 "application " | INHALATION_SPRAY | CUTANEOUS | Status: DC | PRN

## 2018-03-18 MED ORDER — SODIUM CHLORIDE 0.9 % IV SOLN: 100.0000 mL | INTRAVENOUS | Status: DC | PRN

## 2018-03-18 MED ORDER — CHLORHEXIDINE GLUCONATE CLOTH 2 % EX PADS: 6.0000 | MEDICATED_PAD | Freq: Every day | CUTANEOUS | Status: DC

## 2018-03-18 MED ORDER — HEPARIN SODIUM (PORCINE) 1000 UNIT/ML DIALYSIS: 1000.0000 [IU] | INTRAMUSCULAR | Status: DC | PRN

## 2018-03-18 MED ORDER — CARVEDILOL 25 MG PO TABS: 25.0000 mg | ORAL_TABLET | Freq: Two times a day (BID) | ORAL | Status: DC

## 2018-03-18 MED ORDER — INSULIN ASPART 100 UNIT/ML ~~LOC~~ SOLN
7.0000 [IU] | Freq: Three times a day (TID) | SUBCUTANEOUS | Status: DC
  Administered 2018-03-19: 7 [IU] via SUBCUTANEOUS

## 2018-03-18 MED ORDER — INSULIN ASPART 100 UNIT/ML ~~LOC~~ SOLN
0.0000 [IU] | Freq: Three times a day (TID) | SUBCUTANEOUS | Status: DC
  Administered 2018-03-19: 7 [IU] via SUBCUTANEOUS

## 2018-03-18 MED ORDER — INSULIN ASPART 100 UNIT/ML ~~LOC~~ SOLN
7.0000 [IU] | Freq: Three times a day (TID) | SUBCUTANEOUS | Status: DC
  Administered 2018-03-18: 7 [IU] via SUBCUTANEOUS

## 2018-03-18 MED ORDER — HEPARIN SODIUM (PORCINE) 1000 UNIT/ML DIALYSIS: 20.0000 [IU]/kg | INTRAMUSCULAR | Status: DC | PRN

## 2018-03-18 MED ORDER — LIDOCAINE HCL (PF) 1 % IJ SOLN: 5.0000 mL | INTRAMUSCULAR | Status: DC | PRN

## 2018-03-18 NOTE — Progress Notes (Signed)
Discharge order written at 19:45 tonight. Attempted to call social work to facilitate discharge communication as pt will be going to [facility]. No social worker available at this time. AC called for alternatives. On-call hospitalist notified. Will continue to monitor.

## 2018-03-18 NOTE — Progress Notes (Signed)
PROGRESS NOTE    [name]  JSH:[id number] DOB: 1965-08-26 DOA: 03/16/2018  PCP: [name], MD   Brief Narrative:  52 y.o. female with ESRD on hemodialysis T,Th,S (non-compliant), chronic systolic and diastolic CHF, DM, HTN, bipolar disorder and schizophrenia, hepatitis C, jehovah witness. Pt missed HD 03/15/2018 and then presented to ED the following day with worsening shortness of breath and LE edema. CXR showed cardiomegaly. She was also found to have hyperkalemia and underwent urgent HD.   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 (HCC) - CBG's: 287, 257, 307 - Pt on SSI, Novolog 3 units TIDAC and Levemir 20 units daily  - Increase Novolog to 7 units TIDAC    End stage renal disease ([place]) - On HD TTS - Had urgent HD on admission and then again 8/13 - Renal following     Chronic systolic and diastolic CHF - Manage volume overload with HD    Pancytopenia - Due to bone marrow suppression from hepatitis C - Continue to monitor CBC   DVT prophylaxis: Heparin subQ Code Status: full code  Family Communication: no family at bedside this am Disposition Plan: home once cleared by renal    Consultants:   Renal  DM coordinator   Procedures:   HD  Antimicrobials:   None    Subjective: No overnight events.  Objective: Vitals:   03/17/18 2027 03/18/18 0041 03/18/18 0451 03/18/18 0637  BP: (!) 184/75 (!) 183/82 (!) 194/79 (!) 169/69  Pulse: 86 80 79   Resp: 18 18 18    Temp: 98.7 F (37.1 C) 98.2 F (36.8 C) 98.2 F (36.8 C)   TempSrc: Oral Oral Oral   SpO2: 99% 99% 99%   Weight:   73.4 kg   Height:        Intake/Output Summary (Last 24 hours) at 03/18/2018 1119 Last data filed at 03/18/2018 0908 Gross per 24 hour  Intake 720 ml  Output 4000 ml  Net -3280 ml   Filed Weights   03/17/18  1456 03/17/18 1921 03/18/18 0451  Weight: 78.4 kg 74.1 kg 73.4 kg    Examination:  Constitutional: Appears well-developed and well-nourished. No distress.  HENT: Normocephalic. External right and left ear normal. Oropharynx is clear and moist.  Eyes: Conjunctivae and EOM are normal. PERRLA, no scleral icterus.  Neck: Normal ROM. Neck supple.  CVS: Rate controlled, S1/S2 + Pulmonary: Effort and breath sounds normal, no stridor, rhonchi, wheezes, rales.  Abdominal: Soft. BS +,  no distension, tenderness, rebound or guarding.  Musculoskeletal: Normal range of motion. Edema appreciated  Lymphadenopathy: No lymphadenopathy noted, cervical, inguinal. Neuro: Alert. Normal reflexes, muscle tone coordination. No cranial nerve deficit. Skin: Skin is warm and dry. No rash noted. Psychiatric: Normal mood and affect. Behavior, judgment, thought content normal.     Data Reviewed: I have personally reviewed following labs and imaging studies  CBC: Recent Labs  Lab 03/16/18 2122 03/16/18 2129 03/17/18 0538 03/17/18 1449  WBC 2.5*  --  2.6* 2.6*  HGB 10.8* 12.2 10.3* 10.8*  HCT 35.3* 36.0 33.1* 35.9*  MCV 102.9*  --  102.2* 103.5*  PLT 99*  --  86* 96*   Basic Metabolic Panel: Recent Labs  Lab 03/16/18 2122 03/16/18 2129 03/17/18 0538 03/17/18 1449  NA 132* 128* 134* 135  K 6.7* 6.4* 4.7 4.7  CL  88* 92* 93* 91*  CO2 27  --  27 31  GLUCOSE 295* 294* 365* 162*  BUN 81* 72* 41* 48*  CREATININE 9.56* 10.00* 6.33* 6.82*  CALCIUM 8.0*  --  8.2* 8.6*  PHOS  --   --   --  6.0*   GFR: Estimated Creatinine Clearance: 9.6 mL/min (A) (by C-G formula based on SCr of 6.82 mg/dL (H)). Liver Function Tests: Recent Labs  Lab 03/17/18 0538 03/17/18 1449  AST 37  --   ALT 37  --   ALKPHOS 213*  --   BILITOT 1.0  --   PROT 7.2  --   ALBUMIN 3.0* 3.4*   No results for input(s): LIPASE, AMYLASE in the last 168 hours. No results for input(s): AMMONIA in the last 168 hours. Coagulation  Profile: No results for input(s): INR, PROTIME in the last 168 hours. Cardiac Enzymes: No results for input(s): CKTOTAL, CKMB, CKMBINDEX, TROPONINI in the last 168 hours. BNP (last 3 results) No results for input(s): PROBNP in the last 8760 hours. HbA1C: Recent Labs    03/17/18 0823  HGBA1C 6.1*   CBG: Recent Labs  Lab 03/17/18 0743 03/17/18 1115 03/17/18 2110 03/18/18 0732 03/18/18 1105  GLUCAP 405* 188* 287* 257* 307*   Lipid Profile: No results for input(s): CHOL, HDL, LDLCALC, TRIG, CHOLHDL, LDLDIRECT in the last 72 hours. Thyroid Function Tests: No results for input(s): TSH, T4TOTAL, FREET4, T3FREE, THYROIDAB in the last 72 hours. Anemia Panel: No results for input(s): VITAMINB12, FOLATE, FERRITIN, TIBC, IRON, RETICCTPCT in the last 72 hours. Urine analysis:    Component Value Date/Time   COLORURINE YELLOW 02/25/2017 1224   APPEARANCEUR HAZY (A) 02/25/2017 1224   LABSPEC 1.013 02/25/2017 1224   PHURINE 8.0 02/25/2017 1224   GLUCOSEU >=500 (A) 02/25/2017 1224   HGBUR SMALL (A) 02/25/2017 1224   BILIRUBINUR NEGATIVE 02/25/2017 1224   BILIRUBINUR neg 12/13/2014 1127   KETONESUR NEGATIVE 02/25/2017 1224   PROTEINUR >=300 (A) 02/25/2017 1224   UROBILINOGEN 0.2 05/01/2015 1252   NITRITE NEGATIVE 02/25/2017 1224   LEUKOCYTESUR NEGATIVE 02/25/2017 1224   Sepsis Labs: @LABRCNTIP (procalcitonin:4,lacticidven:4)   ) Recent Results (from the past 240 hour(s))  MRSA PCR Screening     Status: None   Collection Time: 03/17/18  7:03 AM  Result Value Ref Range Status   MRSA by PCR NEGATIVE NEGATIVE Final    Comment:        The GeneXpert MRSA Assay (FDA approved for NASAL specimens only), is one component of a comprehensive MRSA colonization surveillance program. It is not intended to diagnose MRSA infection nor to guide or monitor treatment for MRSA infections. Performed at [facility], [place] [address], [place], [place] [address]       Radiology  Studies: Dg Chest 2 View  Result Date: 03/16/2018 CLINICAL DATA:  Chest pain short of breath weakness EXAM: CHEST - 2 VIEW COMPARISON:  08/07/2017, 07/16/2017 FINDINGS: No significant pleural effusion. Cardiomegaly with vascular congestion and mild pulmonary edema. No focal airspace disease. No pneumothorax. IMPRESSION: Cardiomegaly with vascular congestion and minimal interstitial edema. Electronically Signed   By: Donavan Foil M.D.   On: 03/16/2018 21:31       Scheduled Meds: . allopurinol  100 mg Oral Q T,Th,Sat-1800  . amLODipine  10 mg Oral QHS  . atorvastatin  40 mg Oral QHS  . carvedilol  25 mg Oral BID WC  . cloNIDine  0.1 mg Oral Q8H  . heparin  5,000 Units Subcutaneous Q8H  .  insulin aspart  0-5 Units Subcutaneous QHS  . insulin aspart  0-9 Units Subcutaneous TID WC  . insulin aspart  3 Units Subcutaneous TID WC  . insulin detemir  20 Units Subcutaneous Daily   Continuous Infusions: . sodium chloride    . sodium chloride    . sodium chloride    . sodium chloride    . sodium chloride       LOS: 0 days    Time spent: 25 minutes Greater than 50% of the time spent on counseling and coordinating the care.   Leisa Lenz, MD [facility] Pager [phone]  If 7PM-7AM, please contact night-coverage [email or web address] Password [email or web address] 03/18/2018, 11:19 AM

## 2018-03-18 NOTE — Progress Notes (Signed)
Dialysis Orders: G/O T,Th,S 3.5 hrs 180 NRe 400/Autoflow 2.0 70.5 kg 2.0K/2.0 Ca UFP 4 L AVG -No heparin -Mircera 200 mcg IV q 2 weeks (last dose 03/05/2018 Last HGB 11.0 03/12/18) -Venofer 50 mg IV weekly (last dose 03/12/2018 Last Fe 83 Tsat 32% 02/19/2018)  Assessment/Plan: 1.  Hyperkalemia-resolved 2.  ESRD - T,Th,S G/O.  Will have HD again today for volume and I think she can be discharged anytime after that treatment..  3.  Hypertension/volume  -suboptimal  4. Anemia  -Patient does not accept blood transfusions. .  5. H/O polysubstance abuse 6. H/O Bipolar v. Shizoaffective disorder  Subjective: Interval History: Wants dialysis again  Objective: Vital signs in last 24 hours: Temp:  [98.2 F (36.8 C)-98.7 F (37.1 C)] 98.5 F (36.9 C) (08/14 1135) Pulse Rate:  [77-86] 85 (08/14 1135) Resp:  [18-22] 20 (08/14 1135) BP: (140-195)/(69-96) 177/79 (08/14 1135) SpO2:  [95 %-99 %] 97 % (08/14 1135) Weight:  [73.4 kg-78.4 kg] 73.4 kg (08/14 0451) Weight change: 4.092 kg  Intake/Output from previous day: 08/13 0701 - 08/14 0700 In: 723 [P.O.:720; I.V.:3] Out: 4000  Intake/Output this shift: Total I/O In: 240 [P.O.:240] Out: -   General appearance: alert and cooperative Back: symmetric, no curvature. ROM normal. No CVA tenderness., tr presacral edema Resp: clear to auscultation bilaterally Extremities: edema 1+  Lab Results: Recent Labs    03/17/18 0538 03/17/18 1449  WBC 2.6* 2.6*  HGB 10.3* 10.8*  HCT 33.1* 35.9*  PLT 86* 96*   BMET:  Recent Labs    03/17/18 0538 03/17/18 1449  NA 134* 135  K 4.7 4.7  CL 93* 91*  CO2 27 31  GLUCOSE 365* 162*  BUN 41* 48*  CREATININE 6.33* 6.82*  CALCIUM 8.2* 8.6*   No results for input(s): PTH in the last 72 hours. Iron Studies: No results for input(s): IRON, TIBC, TRANSFERRIN, FERRITIN in the last 72 hours. Studies/Results: Dg Chest 2 View  Result Date: 03/16/2018 CLINICAL DATA:  Chest pain short of breath  weakness EXAM: CHEST - 2 VIEW COMPARISON:  08/07/2017, 07/16/2017 FINDINGS: No significant pleural effusion. Cardiomegaly with vascular congestion and mild pulmonary edema. No focal airspace disease. No pneumothorax. IMPRESSION: Cardiomegaly with vascular congestion and minimal interstitial edema. Electronically Signed   By: Donavan Foil M.D.   On: 03/16/2018 21:31    Scheduled: . allopurinol  100 mg Oral Q T,Th,Sat-1800  . amLODipine  10 mg Oral QHS  . atorvastatin  40 mg Oral QHS  . carvedilol  25 mg Oral BID WC  . Chlorhexidine Gluconate Cloth  6 each Topical Q0600  . cloNIDine  0.1 mg Oral Q8H  . heparin  5,000 Units Subcutaneous Q8H  . insulin aspart  0-5 Units Subcutaneous QHS  . insulin aspart  0-9 Units Subcutaneous TID WC  . insulin aspart  7 Units Subcutaneous TID WC  . insulin detemir  20 Units Subcutaneous Daily  . sodium chloride flush  3 mL Intravenous Q12H     LOS: 0 days   Estanislado Emms 03/18/2018,1:08 PM

## 2018-03-18 NOTE — Discharge Instructions (Addendum)
Hemodialysis °Dialysis is a procedure that replaces some of the work that healthy kidneys do. During dialysis, wastes, salt, and extra water are removed from the blood, and the level of certain important minerals in the blood (such as potassium) is maintained. It is typically started when you have lost about 85-90% of your kidney function, or earlier if it may help relieve your symptoms. °Hemodialysis is a type of dialysis in which a machine called a dialyzer is used to filter the blood. Hemodialysis is usually performed by a health care provider at a hospital or dialysis center 3 times a week for 3-5 hours during each visit. It may also be performed on a different schedule at home with training. °Tell a health care provider about: °· Any allergies you have. °· All medicines you are taking, including vitamins, herbs, eye drops, creams, and over-the-counter medicines. °· Any blood disorders you have. °· Any medical conditions you have. °What are the risks? °Generally, this is a safe procedure. However, problems may occur, including: °· Low blood pressure (hypotension). °· Narrowing or ballooning of a blood vessel, or bleeding at the site where blood is removed from the body and returned to the body (vascular access). °· An infection or blockage at the vascular access site. °· Allergic reaction to chemicals used to sterilize the dialyzer. ° °What happens before the procedure? °Before having hemodialysis for the first time, you will need surgery or a procedure to create a vascular access site. There are three types of vascular access: °· Arteriovenous fistula. This type of access is created when an artery and a vein (usually in the arm) are connected by a surgical procedure. The arteriovenous fistula usually takes 1-6 months to develop after surgery. °· Arteriovenous graft. This type of access is created when a tube is used to connect an artery and a vein in the arm. An arteriovenous graft can usually be used within  2-3 weeks of surgery. °· A venous catheter. To create this type of access, a thin, flexible tube (catheter) is placed in a large vein in your neck, chest, or groin. A venous catheter can be used right away. ° °What happens during the procedure? °· Your weight, blood pressure, pulse, and temperature will be measured. °· The skin around your vascular access will be cleaned (sterilized) to get rid of germs. °· Your vascular access will be connected to the dialyzer. °? If your access is a fistula or graft, two needles will be inserted through the vascular access. The needles will be connected to a plastic tube that is connected to the dialyzer. They will be taped to your skin so that they do not move out of place. °? If your access is a catheter, it will be connected to a plastic tube that is then connected to the dialyzer. °· The dialysis machine will be turned on. This will cause your blood to flow to the dialyzer. The dialyzer will filter and clean your blood before returning it to your body. While the dialysis machine is working, your blood pressure and pulse will be checked several times. °· Once dialysis is complete, your vascular access will be disconnected from the dialyzer. If your access is a fistula or graft, the needles will be removed and a bandage (dressing) will be placed over the access. °Hemodialysis is performed while you are sitting or reclining. During the procedure, you may sleep, read, watch television, or perform any other tasks that can be done while you are in this position.   If you experience side effects or any other discomfort during the procedure, let your health care provider know. He or she may be able to make adjustments to help you feel more comfortable. °The procedure may vary among health care providers and hospitals. °What happens after the procedure? °· Your weight will be measured. °· Your blood may be tested to see whether your treatments are removing enough wastes. This is usually  done once a month. °· You may experience or continue to experience side effects, including: °? Dizziness. °? Muscle cramps. °? Nausea. °? Headaches. °? Allergic reaction to the chemicals used to sterilize the dialyzer. °Summary °· Dialysis is a procedure that replaces some of the work that healthy kidneys do. During dialysis, wastes, salt, and extra water are removed from the blood, and the level of certain important minerals in the blood is maintained. °· Hemodialysis is a type of dialysis in which a machine called a dialyzer is used to filter the blood. It is usually performed by a health care provider at a hospital or dialysis center 3 times a week for 3-5 hours during each visit. °· Before having hemodialysis for the first time, you will need surgery or a procedure to create a vascular access. °· During hemodialysis, your vascular access will be connected to the dialyzer. The dialyzer will filter and clean your blood before returning it to your body. °This information is not intended to replace advice given to you by your health care provider. Make sure you discuss any questions you have with your health care provider. °Document Released: 11/16/2012 Document Revised: 08/27/2016 Document Reviewed: 08/27/2016 °[employer]vier Interactive Patient Education © 2018 [employer] ° °

## 2018-03-18 NOTE — Discharge Summary (Addendum)
Physician Discharge Summary  [name] RUE:[id number] DOB: 10-24-1965 DOA: 03/16/2018  PCP: [name] Panda, MD  Admit date: 03/16/2018 Discharge date: 03/18/2018  Recommendations for Outpatient Follow-up:  1. Check CBC and BMP during next visit to PCP. 2. HD per renal sch  Discharge Diagnoses:  Principal Problem:   Hyperkalemia Active Problems:   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 ([place])   ESRD on dialysis [facility])    Discharge Condition: stable   Diet recommendation: as tolerated   History of present illness:  52 y.o.femalewith ESRD on hemodialysis T,Th,S (non-compliant), chronic systolic and diastolic CHF, DM, HTN, bipolar disorder and schizophrenia, hepatitis C, jehovah witness. Pt missed HD 03/15/2018 and then presented to ED the following day with worsening shortness of breath and LE edema. CXR showed cardiomegaly. She was also found to have hyperkalemia and underwent urgent HD.   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) - Can continue home insulin regimen     End stage renal disease ([place]) - On HD TTS - Had urgent HD on admission and then again 8/13 - Stable for discharge from renal standpoint    Chronic systolic and diastolic CHF - Manage volume overload with HD    Pancytopenia - Due to bone marrow suppression from hepatitis C - Cell count WNL   DVT prophylaxis: Heparin subQ Code Status: full code  Family Communication: no family at the bedside this am    Consultants:   Renal  DM coordinator   Procedures:   HD  Antimicrobials:   None    Signed:  Leisa Lenz, MD  [facility] 03/18/2018, 7:46 PM  Pager #: [phone]  Time spent in minutes:35 minutes   Discharge Exam: Vitals:   03/18/18 1500 03/18/18 1511  BP: (!) 114/51  (!) 144/66  Pulse: 84 84  Resp:  18  Temp:  99 F (37.2 C)  SpO2:  100%   Vitals:   03/18/18 1400 03/18/18 1430 03/18/18 1500 03/18/18 1511  BP: (!) 158/77 139/65 (!) 114/51 (!) 144/66  Pulse: 82 84 84 84  Resp:    18  Temp:    99 F (37.2 C)  TempSrc:    Oral  SpO2:    100%  Weight:    72.2 kg  Height:        General: Pt is alert, follows commands appropriately, not in acute distress Cardiovascular: Regular rate and rhythm, S1/S2 + Respiratory: Clear to auscultation bilaterally, no wheezing, no crackles, no rhonchi Abdominal: Soft, non tender, non distended, bowel sounds +, no guarding Extremities: no edema, no cyanosis, pulses palpable bilaterally DP and PT Neuro: Grossly nonfocal  Discharge Instructions  Discharge Instructions    Diet - low sodium heart healthy   Complete by:  As directed    Increase activity slowly   Complete by:  As directed      Allergies as of 03/18/2018      Reactions   Penicillins Hives, Other (See Comments)   Has patient had a PCN reaction causing immediate rash, facial/tongue/throat swelling, SOB or lightheadedness with hypotension: Yes Has patient had a PCN reaction causing severe rash involving mucus membranes or skin necrosis: No Has patient had a PCN reaction that required hospitalization No Has patient had a PCN reaction occurring within the last 10 years: No If all  of the above answers are "NO", then may proceed with Cephalosporin use. Pt tolerates cefepime and ceftriaxone   Gabapentin Itching, Swelling   Lyrica [pregabalin] Swelling, Other (See Comments)   Facial and leg swelling   Saphris [asenapine] Swelling, Other (See Comments)   Facial swelling   Tramadol Swelling, Other (See Comments)   Leg swelling   Adhesive [tape] Itching   SEVERE ITCHING   Latex Itching   SEVERE ITCHING   Other Other (See Comments)   NO BLOOD PRODUCTS; PATIENT IS A JEHOVAH'S WITNESS      Medication List    TAKE these medications   albuterol  (2.5 MG/3ML) 0.083% nebulizer solution Commonly known as:  PROVENTIL Take 2.5 mg by nebulization every 6 (six) hours as needed for wheezing or shortness of breath.   albuterol 108 (90 Base) MCG/ACT inhaler Commonly known as:  PROVENTIL HFA;VENTOLIN HFA Inhale 2 puffs into the lungs every 4 (four) hours as needed for wheezing or shortness of breath.   allopurinol 100 MG tablet Commonly known as:  ZYLOPRIM Take 1 tablet (100 mg total) by mouth every Tuesday, Thursday, and Saturday at 6 PM.   amLODipine 10 MG tablet Commonly known as:  NORVASC Take 1 tablet (10 mg total) by mouth at bedtime.   atorvastatin 40 MG tablet Commonly known as:  LIPITOR Take 1 tablet (40 mg total) by mouth at bedtime.   bisacodyl 10 MG suppository Commonly known as:  DULCOLAX Place 10 mg rectally daily as needed (for constipation).   calcium acetate 667 MG capsule Commonly known as:  PHOSLO Take 2 capsules (1,334 mg total) by mouth 3 (three) times daily with meals.   camphor-menthol lotion Commonly known as:  SARNA Apply 1 application topically as needed for itching.   carvedilol 25 MG tablet Commonly known as:  COREG Take 1 tablet (25 mg total) by mouth 2 (two) times daily with a meal.   cloNIDine 0.1 MG tablet Commonly known as:  CATAPRES Take 1 tablet (0.1 mg total) by mouth every 8 (eight) hours. What changed:  additional instructions   diphenhydrAMINE 25 mg capsule Commonly known as:  BENADRYL Take 25 mg by mouth every 6 (six) hours as needed for itching.   docusate sodium 100 MG capsule Commonly known as:  COLACE Take 100 mg by mouth 2 (two) times daily.   DULoxetine 30 MG capsule Commonly known as:  CYMBALTA Take 30 mg by mouth daily. Take with 60mg  to equal 90mg  once daily   DULoxetine 60 MG capsule Commonly known as:  CYMBALTA Take 60 mg by mouth daily. Take with 30mg  to equal 90mg  once daily   feeding supplement (NEPRO CARB STEADY) Liqd Take 237 mLs by mouth 3 (three) times  daily between meals.   Ferrous Sulfate 142 (45 Fe) MG Tbcr Take 1 tablet by mouth daily.   hydrocortisone 2.5 % rectal cream Commonly known as:  ANUSOL-HC Place rectally 2 (two) times daily. What changed:    how much to take  when to take this  reasons to take this   hydrOXYzine 25 MG tablet Commonly known as:  ATARAX/VISTARIL Take 25 mg by mouth 2 (two) times daily as needed for itching.   insulin aspart 100 UNIT/ML injection Commonly known as:  novoLOG Inject 7 Units into the skin 3 (three) times daily with meals. What changed:    when to take this  additional instructions   insulin glargine 100 UNIT/ML injection Commonly known as:  LANTUS Inject 0.2 mLs (20 Units total)  into the skin at bedtime.   LEVEMIR FLEXTOUCH 100 UNIT/ML Pen Generic drug:  Insulin Detemir Inject 20 Units into the skin at bedtime.   LORazepam 0.5 MG tablet Commonly known as:  ATIVAN Take 1 tablet (0.5 mg total) by mouth every 8 (eight) hours as needed for anxiety. What changed:    how much to take  when to take this   methocarbamol 500 MG tablet Commonly known as:  ROBAXIN Take 500 mg by mouth 3 (three) times daily as needed for muscle spasms.   MINERIN Crea Apply 1 application topically 2 (two) times daily.   multivitamin Tabs tablet Take 1 tablet by mouth at bedtime.   neomycin-bacitracin-polymyxin ointment Commonly known as:  NEOSPORIN Apply 1 application topically daily as needed for wound care (until healed).   Oxycodone HCl 10 MG Tabs Take 1 tablet (10 mg total) by mouth every 6 (six) hours as needed. What changed:  reasons to take this   pantoprazole 40 MG tablet Commonly known as:  PROTONIX Take 1 tablet (40 mg total) by mouth daily at 6 (six) AM.   PRO-STAT Liqd Take 30 mLs by mouth 2 (two) times daily.   senna 8.6 MG tablet Commonly known as:  SENOKOT Take 2 tablets by mouth daily.   sertraline 50 MG tablet Commonly known as:  ZOLOFT Take 1 tablet (50 mg  total) by mouth daily.   traZODone 50 MG tablet Commonly known as:  DESYREL Take 0.5 tablets (25 mg total) by mouth at bedtime.   VELPHORO 500 MG chewable tablet Generic drug:  sucroferric oxyhydroxide Chew 1,000 mg by mouth See admin instructions. Take 2 tablets by mouth for 3 times daily and 1 tablet daily as needed with snacks        Contact information for follow-up providers    [name] Panda, MD. Schedule an appointment as soon as possible for a visit in 1 week(s).   Specialty:  Internal Medicine Contact information: [address] [place] [address] [phone]            Contact information for after-discharge care    Destination    [facility] .   Service:  Skilled Nursing Contact information: [place] [phone]                   The results of significant diagnostics from this hospitalization (including imaging, microbiology, ancillary and laboratory) are listed below for reference.    Significant Diagnostic Studies: Dg Chest 2 View  Result Date: 03/16/2018 CLINICAL DATA:  Chest pain short of breath weakness EXAM: CHEST - 2 VIEW COMPARISON:  08/07/2017, 07/16/2017 FINDINGS: No significant pleural effusion. Cardiomegaly with vascular congestion and mild pulmonary edema. No focal airspace disease. No pneumothorax. IMPRESSION: Cardiomegaly with vascular congestion and minimal interstitial edema. Electronically Signed   By: Donavan Foil M.D.   On: 03/16/2018 21:31    Microbiology: Recent Results (from the past 240 hour(s))  MRSA PCR Screening     Status: None   Collection Time: 03/17/18  7:03 AM  Result Value Ref Range Status   MRSA by PCR NEGATIVE NEGATIVE Final    Comment:        The GeneXpert MRSA Assay (FDA approved for NASAL specimens only), is one component of a comprehensive MRSA colonization surveillance program. It is not intended to diagnose  MRSA infection nor to guide or monitor treatment for MRSA infections. Performed at [facility], [place]  [address], [place], [place] [address]      Labs: Basic Metabolic Panel: Recent Labs  Lab 03/16/18 2122 03/16/18 2129 03/17/18 0538 03/17/18 1449 03/18/18 1306  NA 132* 128* 134* 135 137  K 6.7* 6.4* 4.7 4.7 4.3  CL 88* 92* 93* 91* 100  CO2 27  --  27 31 26   GLUCOSE 295* 294* 365* 162* 249*  BUN 81* 72* 41* 48* 34*  CREATININE 9.56* 10.00* 6.33* 6.82* 4.69*  CALCIUM 8.0*  --  8.2* 8.6* 9.1  PHOS  --   --   --  6.0* 5.7*   Liver Function Tests: Recent Labs  Lab 03/17/18 0538 03/17/18 1449 03/18/18 1306  AST 37  --   --   ALT 37  --   --   ALKPHOS 213*  --   --   BILITOT 1.0  --   --   PROT 7.2  --   --   ALBUMIN 3.0* 3.4* 3.0*   No results for input(s): LIPASE, AMYLASE in the last 168 hours. No results for input(s): AMMONIA in the last 168 hours. CBC: Recent Labs  Lab 03/16/18 2122 03/16/18 2129 03/17/18 0538 03/17/18 1449 03/18/18 1306  WBC 2.5*  --  2.6* 2.6* 2.5*  HGB 10.8* 12.2 10.3* 10.8* 10.2*  HCT 35.3* 36.0 33.1* 35.9* 34.4*  MCV 102.9*  --  102.2* 103.5* 104.2*  PLT 99*  --  86* 96* 91*   Cardiac Enzymes: No results for input(s): CKTOTAL, CKMB, CKMBINDEX, TROPONINI in the last 168 hours. BNP: BNP (last 3 results) Recent Labs    06/03/17 1213 08/07/17 1029  BNP 2,212.3* >4,500.0*    ProBNP (last 3 results) No results for input(s): PROBNP in the last 8760 hours.  CBG: Recent Labs  Lab 03/17/18 1115 03/17/18 2110 03/18/18 0732 03/18/18 1105 03/18/18 1627  GLUCAP 188* 287* 257* 307* 178*

## 2018-03-18 NOTE — Progress Notes (Signed)
Patient seen with moderate amount of blood on gown this morning. Patient states the night shift nurse gave her a heparin injection in that location on abdomen (right lower quadrant). Bleeding has stopped and gauze dressing applied. Small nodule noted in that location.

## 2018-03-18 NOTE — Procedures (Signed)
Tol HD. Goal 2.5 liters No hemodynamic issues. [name], MD

## 2018-03-18 NOTE — Progress Notes (Signed)
Per attending MD, patient will be discharged tonight. Awaiting orders.  1925: Oncoming staff made aware.

## 2018-03-19 DIAGNOSIS — N186 End stage renal disease: Secondary | ICD-10-CM

## 2018-03-19 DIAGNOSIS — E119 Type 2 diabetes mellitus without complications: Secondary | ICD-10-CM

## 2018-03-19 DIAGNOSIS — E875 Hyperkalemia: Principal | ICD-10-CM

## 2018-03-19 DIAGNOSIS — Z992 Dependence on renal dialysis: Secondary | ICD-10-CM

## 2018-03-19 DIAGNOSIS — Z794 Long term (current) use of insulin: Secondary | ICD-10-CM

## 2018-03-19 LAB — CBC
HCT: 33.4 % — ABNORMAL LOW (ref 36.0–46.0)
HEMOGLOBIN: 10.3 g/dL — AB (ref 12.0–15.0)
MCH: 31.7 pg (ref 26.0–34.0)
MCHC: 30.8 g/dL (ref 30.0–36.0)
MCV: 102.8 fL — AB (ref 78.0–100.0)
Platelets: 94 10*3/uL — ABNORMAL LOW (ref 150–400)
RBC: 3.25 MIL/uL — AB (ref 3.87–5.11)
RDW: 16.3 % — ABNORMAL HIGH (ref 11.5–15.5)
WBC: 2.9 10*3/uL — ABNORMAL LOW (ref 4.0–10.5)

## 2018-03-19 LAB — RENAL FUNCTION PANEL
ANION GAP: 14 (ref 5–15)
Albumin: 3 g/dL — ABNORMAL LOW (ref 3.5–5.0)
BUN: 39 mg/dL — ABNORMAL HIGH (ref 6–20)
CHLORIDE: 97 mmol/L — AB (ref 98–111)
CO2: 25 mmol/L (ref 22–32)
Calcium: 9.2 mg/dL (ref 8.9–10.3)
Creatinine, Ser: 4.67 mg/dL — ABNORMAL HIGH (ref 0.44–1.00)
GFR calc Af Amer: 12 mL/min — ABNORMAL LOW (ref >60)
GFR calc non Af Amer: 10 mL/min — ABNORMAL LOW (ref >60)
Glucose, Bld: 234 mg/dL — ABNORMAL HIGH (ref 70–99)
Phosphorus: 6 mg/dL — ABNORMAL HIGH (ref 2.5–4.6)
Potassium: 4.5 mmol/L (ref 3.5–5.1)
Sodium: 136 mmol/L (ref 135–145)

## 2018-03-19 LAB — GLUCOSE, CAPILLARY
Glucose-Capillary: 224 mg/dL — ABNORMAL HIGH (ref 70–99)
Glucose-Capillary: 307 mg/dL — ABNORMAL HIGH (ref 70–99)

## 2018-03-19 NOTE — Progress Notes (Signed)
Patient has order to discharge. Report called to [facility]. IV and telemetry removed.

## 2018-03-19 NOTE — Discharge Summary (Signed)
Physician Discharge Summary  [name] WVP:[id number] DOB: 27-Aug-1965 DOA: 03/16/2018  PCP: [name] Panda, MD  Admit date: 03/16/2018 Discharge date: 03/19/2018  Admitted From: [facility]  Disposition:  [facility]   Recommendations for Outpatient Follow-up:  1. Please obtain CBC in 1 week   Home Health: N/A  Equipment/Devices: TBD at SNF  Discharge Condition: Fair  CODE STATUS: FULL Diet recommendation: Renal, diabetic  Brief/Interim Summary: [name] is a 52 y.o. F with ESRD on HD TThS, chronic systolic and diastolic CHF, DM, HTN, bipolar schizophrenia, hepatitis C, and Jehovah's Witness who presents with shortness of breath and swelling, found to have hyperkalemia.  Underwent emergent HD on admission.     Discharge Diagnoses:   End-stage renal disease Hyperkalemia Acute on chronic systolic and diastolic CHF Presented with fluid overload and hyperkalemia from missed dialysis.  She underwent dialysis in the emergency room, and her symptoms resolved.  On day of discharge, she felt comfortable.   Pancytopenia Chronic, no change.  Diabetes Continued on home insulin regimen.  Hepatitis C      Discharge Instructions  Discharge Instructions    Call MD for:  persistant nausea and vomiting   Complete by:  As directed    Call MD for:  redness, tenderness, or signs of infection (pain, swelling, redness, odor or green/yellow discharge around incision site)   Complete by:  As directed    Call MD for:  severe uncontrolled pain   Complete by:  As directed    Diet - low sodium heart healthy   Complete by:  As directed    Diet - low sodium heart healthy   Complete by:  As directed    Increase activity slowly   Complete by:  As directed    Increase activity slowly   Complete by:  As directed      Allergies as of 03/19/2018      Reactions   Penicillins Hives, Other (See Comments)   Has patient had a PCN reaction causing immediate rash, facial/tongue/throat  swelling, SOB or lightheadedness with hypotension: Yes Has patient had a PCN reaction causing severe rash involving mucus membranes or skin necrosis: No Has patient had a PCN reaction that required hospitalization No Has patient had a PCN reaction occurring within the last 10 years: No If all of the above answers are "NO", then may proceed with Cephalosporin use. Pt tolerates cefepime and ceftriaxone   Gabapentin Itching, Swelling   Lyrica [pregabalin] Swelling, Other (See Comments)   Facial and leg swelling   Saphris [asenapine] Swelling, Other (See Comments)   Facial swelling   Tramadol Swelling, Other (See Comments)   Leg swelling   Adhesive [tape] Itching   SEVERE ITCHING   Latex Itching   SEVERE ITCHING   Other Other (See Comments)   NO BLOOD PRODUCTS; PATIENT IS A JEHOVAH'S WITNESS      Medication List    TAKE these medications   albuterol (2.5 MG/3ML) 0.083% nebulizer solution Commonly known as:  PROVENTIL Take 2.5 mg by nebulization every 6 (six) hours as needed for wheezing or shortness of breath.   albuterol 108 (90 Base) MCG/ACT inhaler Commonly known as:  PROVENTIL HFA;VENTOLIN HFA Inhale 2 puffs into the lungs every 4 (four) hours as needed for wheezing or shortness of breath.   allopurinol 100 MG tablet Commonly known as:  ZYLOPRIM Take 1 tablet (100 mg total) by mouth every Tuesday, Thursday, and Saturday at 6 PM.   amLODipine 10 MG tablet Commonly known  as:  NORVASC Take 1 tablet (10 mg total) by mouth at bedtime.   atorvastatin 40 MG tablet Commonly known as:  LIPITOR Take 1 tablet (40 mg total) by mouth at bedtime.   bisacodyl 10 MG suppository Commonly known as:  DULCOLAX Place 10 mg rectally daily as needed (for constipation).   calcium acetate 667 MG capsule Commonly known as:  PHOSLO Take 2 capsules (1,334 mg total) by mouth 3 (three) times daily with meals.   camphor-menthol lotion Commonly known as:  SARNA Apply 1 application topically as  needed for itching.   carvedilol 25 MG tablet Commonly known as:  COREG Take 1 tablet (25 mg total) by mouth 2 (two) times daily with a meal.   cloNIDine 0.1 MG tablet Commonly known as:  CATAPRES Take 1 tablet (0.1 mg total) by mouth every 8 (eight) hours. What changed:  additional instructions   diphenhydrAMINE 25 mg capsule Commonly known as:  BENADRYL Take 25 mg by mouth every 6 (six) hours as needed for itching.   docusate sodium 100 MG capsule Commonly known as:  COLACE Take 100 mg by mouth 2 (two) times daily.   DULoxetine 30 MG capsule Commonly known as:  CYMBALTA Take 30 mg by mouth daily. Take with 60mg  to equal 90mg  once daily   DULoxetine 60 MG capsule Commonly known as:  CYMBALTA Take 60 mg by mouth daily. Take with 30mg  to equal 90mg  once daily   feeding supplement (NEPRO CARB STEADY) Liqd Take 237 mLs by mouth 3 (three) times daily between meals.   Ferrous Sulfate 142 (45 Fe) MG Tbcr Take 1 tablet by mouth daily.   hydrocortisone 2.5 % rectal cream Commonly known as:  ANUSOL-HC Place rectally 2 (two) times daily. What changed:    how much to take  when to take this  reasons to take this   hydrOXYzine 25 MG tablet Commonly known as:  ATARAX/VISTARIL Take 25 mg by mouth 2 (two) times daily as needed for itching.   insulin aspart 100 UNIT/ML injection Commonly known as:  novoLOG Inject 7 Units into the skin 3 (three) times daily with meals. What changed:    when to take this  additional instructions   insulin glargine 100 UNIT/ML injection Commonly known as:  LANTUS Inject 0.2 mLs (20 Units total) into the skin at bedtime.   LEVEMIR FLEXTOUCH 100 UNIT/ML Pen Generic drug:  Insulin Detemir Inject 20 Units into the skin at bedtime.   LORazepam 0.5 MG tablet Commonly known as:  ATIVAN Take 1 tablet (0.5 mg total) by mouth every 8 (eight) hours as needed for anxiety. What changed:    how much to take  when to take this   methocarbamol  500 MG tablet Commonly known as:  ROBAXIN Take 500 mg by mouth 3 (three) times daily as needed for muscle spasms.   MINERIN Crea Apply 1 application topically 2 (two) times daily.   multivitamin Tabs tablet Take 1 tablet by mouth at bedtime.   neomycin-bacitracin-polymyxin ointment Commonly known as:  NEOSPORIN Apply 1 application topically daily as needed for wound care (until healed).   Oxycodone HCl 10 MG Tabs Take 1 tablet (10 mg total) by mouth every 6 (six) hours as needed. What changed:  reasons to take this   pantoprazole 40 MG tablet Commonly known as:  PROTONIX Take 1 tablet (40 mg total) by mouth daily at 6 (six) AM.   PRO-STAT Liqd Take 30 mLs by mouth 2 (two) times daily.  senna 8.6 MG tablet Commonly known as:  SENOKOT Take 2 tablets by mouth daily.   sertraline 50 MG tablet Commonly known as:  ZOLOFT Take 1 tablet (50 mg total) by mouth daily.   traZODone 50 MG tablet Commonly known as:  DESYREL Take 0.5 tablets (25 mg total) by mouth at bedtime.   VELPHORO 500 MG chewable tablet Generic drug:  sucroferric oxyhydroxide Chew 1,000 mg by mouth See admin instructions. Take 2 tablets by mouth for 3 times daily and 1 tablet daily as needed with snacks       Contact information for follow-up providers    [name] Panda, MD. Schedule an appointment as soon as possible for a visit in 1 week(s).   Specialty:  Internal Medicine Contact information: [address] [place] [address] [phone]            Contact information for after-discharge care    Destination    [facility] Preferred SNF .   Service:  Skilled Nursing Contact information: [place] [phone]                 Allergies  Allergen Reactions  . Penicillins Hives and Other (See Comments)    Has patient had a PCN reaction causing immediate rash, facial/tongue/throat swelling, SOB or lightheadedness with  hypotension: Yes Has patient had a PCN reaction causing severe rash involving mucus membranes or skin necrosis: No Has patient had a PCN reaction that required hospitalization No Has patient had a PCN reaction occurring within the last 10 years: No If all of the above answers are "NO", then may proceed with Cephalosporin use. Pt tolerates cefepime and ceftriaxone  . Gabapentin Itching and Swelling  . Lyrica [Pregabalin] Swelling and Other (See Comments)    Facial and leg swelling  . Saphris [Asenapine] Swelling and Other (See Comments)    Facial swelling  . Tramadol Swelling and Other (See Comments)    Leg swelling  . Adhesive [Tape] Itching    SEVERE ITCHING  . Latex Itching    SEVERE ITCHING  . Other Other (See Comments)    NO BLOOD PRODUCTS; PATIENT IS A JEHOVAH'S WITNESS    Consultations:  Nephrology   Procedures/Studies: Dg Chest 2 View  Result Date: 03/16/2018 CLINICAL DATA:  Chest pain short of breath weakness EXAM: CHEST - 2 VIEW COMPARISON:  08/07/2017, 07/16/2017 FINDINGS: No significant pleural effusion. Cardiomegaly with vascular congestion and mild pulmonary edema. No focal airspace disease. No pneumothorax. IMPRESSION: Cardiomegaly with vascular congestion and minimal interstitial edema. Electronically Signed   By: Donavan Foil M.D.   On: 03/16/2018 21:31      Subjective: Feeling well.  No dyspnea, swelling.  No confusion.  Blurry vision resolved.  No pain in chest, abdomen, legs.  Discharge Exam: Vitals:   03/19/18 1030 03/19/18 1100  BP: (!) 131/56 123/60  Pulse: 86 84  Resp:    Temp:    SpO2:     Vitals:   03/19/18 0930 03/19/18 1000 03/19/18 1030 03/19/18 1100  BP: 136/61 126/66 (!) 131/56 123/60  Pulse: 82 84 86 84  Resp:      Temp:      TempSrc:      SpO2:      Weight:      Height:        General: Pt is alert, awake, not in acute distress, on HD maching Cardiovascular: RRR, S1/S2 +, no rubs, no gallops Respiratory: CTA bilaterally, no  wheezing,  no rhonchi Abdominal: Soft, NT, ND, bowel sounds + Extremities: no edema, no cyanosis, the right arm is slightly atrophic    The results of significant diagnostics from this hospitalization (including imaging, microbiology, ancillary and laboratory) are listed below for reference.     Microbiology: Recent Results (from the past 240 hour(s))  MRSA PCR Screening     Status: None   Collection Time: 03/17/18  7:03 AM  Result Value Ref Range Status   MRSA by PCR NEGATIVE NEGATIVE Final    Comment:        The GeneXpert MRSA Assay (FDA approved for NASAL specimens only), is one component of a comprehensive MRSA colonization surveillance program. It is not intended to diagnose MRSA infection nor to guide or monitor treatment for MRSA infections. Performed at [facility], [place] [address], [place], [place] [address]      Labs: BNP (last 3 results) Recent Labs    06/03/17 1213 08/07/17 1029  BNP 2,212.3* >9,381.0*   Basic Metabolic Panel: Recent Labs  Lab 03/16/18 2122 03/16/18 2129 03/17/18 0538 03/17/18 1449 03/18/18 1306 03/19/18 0818  NA 132* 128* 134* 135 137 136  K 6.7* 6.4* 4.7 4.7 4.3 4.5  CL 88* 92* 93* 91* 100 97*  CO2 27  --  27 31 26 25   GLUCOSE 295* 294* 365* 162* 249* 234*  BUN 81* 72* 41* 48* 34* 39*  CREATININE 9.56* 10.00* 6.33* 6.82* 4.69* 4.67*  CALCIUM 8.0*  --  8.2* 8.6* 9.1 9.2  PHOS  --   --   --  6.0* 5.7* 6.0*   Liver Function Tests: Recent Labs  Lab 03/17/18 0538 03/17/18 1449 03/18/18 1306 03/19/18 0818  AST 37  --   --   --   ALT 37  --   --   --   ALKPHOS 213*  --   --   --   BILITOT 1.0  --   --   --   PROT 7.2  --   --   --   ALBUMIN 3.0* 3.4* 3.0* 3.0*   No results for input(s): LIPASE, AMYLASE in the last 168 hours. No results for input(s): AMMONIA in the last 168 hours. CBC: Recent Labs  Lab 03/16/18 2122 03/16/18 2129 03/17/18 0538 03/17/18 1449 03/18/18 1306 03/19/18 0818  WBC 2.5*  --  2.6* 2.6*  2.5* 2.9*  HGB 10.8* 12.2 10.3* 10.8* 10.2* 10.3*  HCT 35.3* 36.0 33.1* 35.9* 34.4* 33.4*  MCV 102.9*  --  102.2* 103.5* 104.2* 102.8*  PLT 99*  --  86* 96* 91* 94*   Cardiac Enzymes: No results for input(s): CKTOTAL, CKMB, CKMBINDEX, TROPONINI in the last 168 hours. BNP: Invalid input(s): POCBNP CBG: Recent Labs  Lab 03/18/18 0732 03/18/18 1105 03/18/18 1627 03/18/18 2106 03/19/18 0650  GLUCAP 257* 307* 178* 269* 224*   D-Dimer No results for input(s): DDIMER in the last 72 hours. Hgb A1c Recent Labs    03/17/18 0823  HGBA1C 6.1*   Lipid Profile No results for input(s): CHOL, HDL, LDLCALC, TRIG, CHOLHDL, LDLDIRECT in the last 72 hours. Thyroid function studies No results for input(s): TSH, T4TOTAL, T3FREE, THYROIDAB in the last 72 hours.  Invalid input(s): FREET3 Anemia work up No results for input(s): VITAMINB12, FOLATE, FERRITIN, TIBC, IRON, RETICCTPCT in the last 72 hours. Urinalysis    Component Value Date/Time   COLORURINE YELLOW 02/25/2017 1224   APPEARANCEUR HAZY (A) 02/25/2017 1224   LABSPEC 1.013 02/25/2017 1224   PHURINE 8.0 02/25/2017 1224  GLUCOSEU >=500 (A) 02/25/2017 1224   HGBUR SMALL (A) 02/25/2017 1224   BILIRUBINUR NEGATIVE 02/25/2017 1224   BILIRUBINUR neg 12/13/2014 1127   KETONESUR NEGATIVE 02/25/2017 1224   PROTEINUR >=300 (A) 02/25/2017 1224   UROBILINOGEN 0.2 05/01/2015 1252   NITRITE NEGATIVE 02/25/2017 1224   LEUKOCYTESUR NEGATIVE 02/25/2017 1224   Sepsis Labs Invalid input(s): PROCALCITONIN,  WBC,  LACTICIDVEN Microbiology Recent Results (from the past 240 hour(s))  MRSA PCR Screening     Status: None   Collection Time: 03/17/18  7:03 AM  Result Value Ref Range Status   MRSA by PCR NEGATIVE NEGATIVE Final    Comment:        The GeneXpert MRSA Assay (FDA approved for NASAL specimens only), is one component of a comprehensive MRSA colonization surveillance program. It is not intended to diagnose MRSA infection nor to guide  or monitor treatment for MRSA infections. Performed at [facility], [place] [address] [place], [place] [address]      Time coordinating discharge: 20 minutes       SIGNED:   Edwin Dada, MD  [facility] 03/19/2018, 11:38 AM

## 2018-03-19 NOTE — Procedures (Signed)
Tolerating HD.  Weights appear incorrect. Will need repeat weight and lower EDW at discharge. Down > 7 liters net during hospitalization. [name], MD

## 2018-03-19 NOTE — Clinical Social Work Note (Signed)
CSW facilitated patient discharge including contacting facility to confirm patient discharge plans. Unable to reach patient's son. Phone number not in service. Clinical information faxed to facility and family agreeable with plan. CSW arranged ambulance transport via [facility] to [facility]. RN to call report prior to discharge [phone] Room [phone]).  CSW will sign off for now as social work intervention is no longer needed. Please consult [place] again if new needs arise.  Dayton Scrape, [place]

## 2018-04-11 ENCOUNTER — Emergency Department (HOSPITAL_COMMUNITY)
Admission: EM | Admit: 2018-04-11 | Discharge: 2018-04-11 | Disposition: A | Discharge status: Home / Self Care | Payer: Medicare Other | Attending: Emergency Medicine | Admitting: Emergency Medicine

## 2018-04-11 ENCOUNTER — Encounter (HOSPITAL_COMMUNITY): Payer: Self-pay

## 2018-04-11 DIAGNOSIS — E119 Type 2 diabetes mellitus without complications: Secondary | ICD-10-CM | POA: Insufficient documentation

## 2018-04-11 DIAGNOSIS — I5042 Chronic combined systolic (congestive) and diastolic (congestive) heart failure: Secondary | ICD-10-CM | POA: Diagnosis not present

## 2018-04-11 DIAGNOSIS — Y829 Unspecified medical devices associated with adverse incidents: Secondary | ICD-10-CM | POA: Insufficient documentation

## 2018-04-11 DIAGNOSIS — F17210 Nicotine dependence, cigarettes, uncomplicated: Secondary | ICD-10-CM | POA: Diagnosis not present

## 2018-04-11 DIAGNOSIS — Z91040 Latex allergy status: Secondary | ICD-10-CM | POA: Insufficient documentation

## 2018-04-11 DIAGNOSIS — T82590A Other mechanical complication of surgically created arteriovenous fistula, initial encounter: Secondary | ICD-10-CM | POA: Diagnosis not present

## 2018-04-11 DIAGNOSIS — I110 Hypertensive heart disease with heart failure: Secondary | ICD-10-CM | POA: Insufficient documentation

## 2018-04-11 DIAGNOSIS — Z79899 Other long term (current) drug therapy: Secondary | ICD-10-CM | POA: Diagnosis not present

## 2018-04-11 DIAGNOSIS — Z794 Long term (current) use of insulin: Secondary | ICD-10-CM | POA: Insufficient documentation

## 2018-04-11 LAB — BASIC METABOLIC PANEL
Anion gap: 14 (ref 5–15)
BUN: 20 mg/dL (ref 6–20)
CO2: 27 mmol/L (ref 22–32)
Calcium: 8.4 mg/dL — ABNORMAL LOW (ref 8.9–10.3)
Chloride: 94 mmol/L — ABNORMAL LOW (ref 98–111)
Creatinine, Ser: 4.28 mg/dL — ABNORMAL HIGH (ref 0.44–1.00)
GFR calc Af Amer: 13 mL/min — ABNORMAL LOW (ref >60)
GFR calc non Af Amer: 11 mL/min — ABNORMAL LOW (ref >60)
Glucose, Bld: 316 mg/dL — ABNORMAL HIGH (ref 70–99)
POTASSIUM: 6 mmol/L — AB (ref 3.5–5.1)
SODIUM: 135 mmol/L (ref 135–145)

## 2018-04-11 LAB — CBC
HEMATOCRIT: 40.3 % (ref 36.0–46.0)
Hemoglobin: 12.2 g/dL (ref 12.0–15.0)
MCH: 30.9 pg (ref 26.0–34.0)
MCHC: 30.3 g/dL (ref 30.0–36.0)
MCV: 102 fL — ABNORMAL HIGH (ref 78.0–100.0)
PLATELETS: 200 10*3/uL (ref 150–400)
RBC: 3.95 MIL/uL (ref 3.87–5.11)
RDW: 16.4 % — AB (ref 11.5–15.5)
WBC: 3.9 10*3/uL — AB (ref 4.0–10.5)

## 2018-04-11 IMAGING — DX DG CHEST 1V PORT
1 series · 1 of 1 positions shown · non-contrast
Comparison: Chest radiograph performed 10/15/2016

CLINICAL DATA: Acute onset of hypoxia.  Initial encounter.

EXAM:
PORTABLE CHEST 1 VIEW

[chest ap]
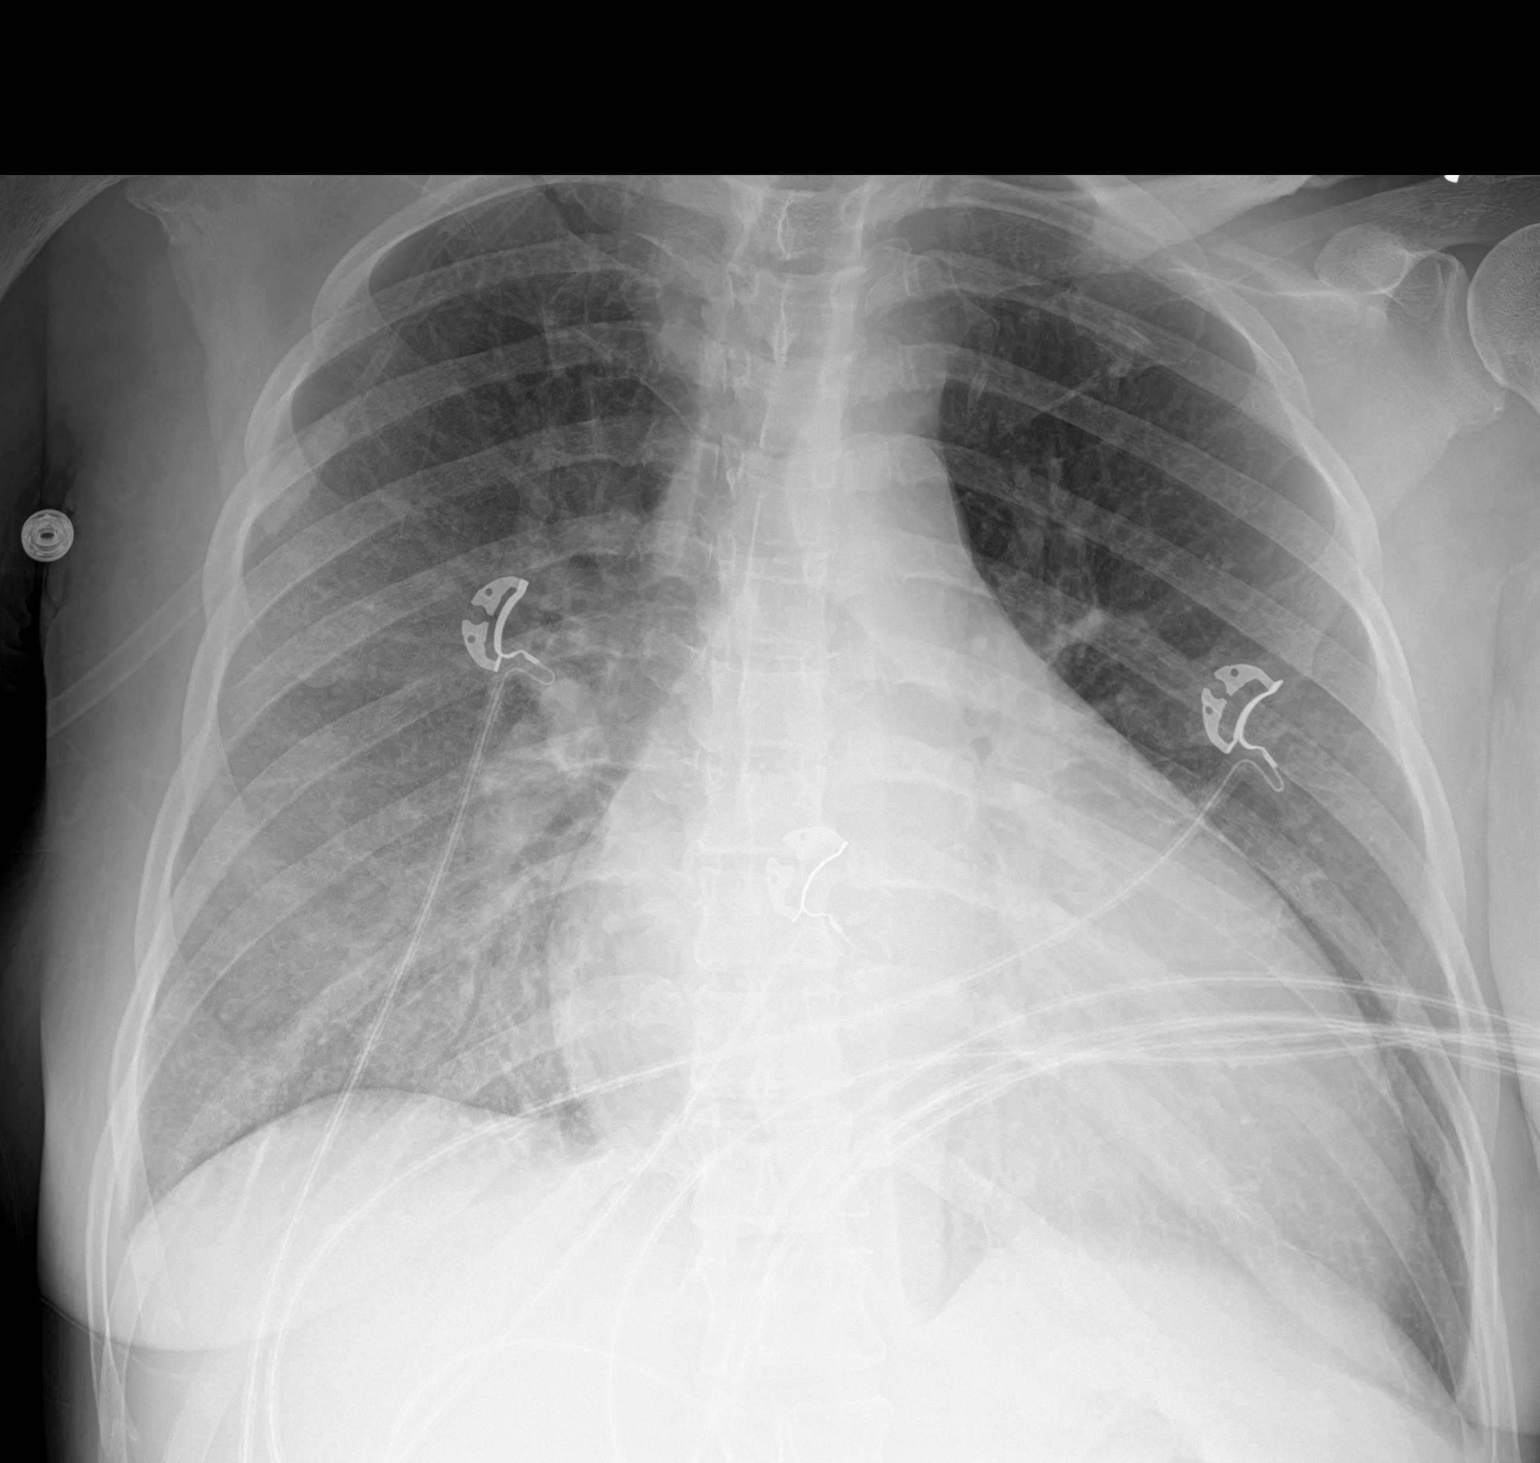

[1 of 1 positions shown; findings below may reference images not displayed]

FINDINGS: The lungs are well-aerated. Vascular congestion is noted. Hazy right
basilar airspace opacity may reflect mild interstitial edema or
possibly pneumonia. There is no evidence of pleural effusion or
pneumothorax.

The cardiomediastinal silhouette is mildly enlarged. No acute
osseous abnormalities are seen.
IMPRESSION: Vascular congestion and mild cardiomegaly. Hazy right basilar
airspace opacity may reflect mild interstitial edema or possibly
pneumonia.

## 2018-04-11 IMAGING — CR DG FEMUR 2+V*L*
4 series · 4 of 4 positions shown · non-contrast
Comparison: None.

CLINICAL DATA: Initial evaluation for recent fall, pain.

EXAM:
LEFT FEMUR 2 VIEWS

[femur ap (1 of 2)]
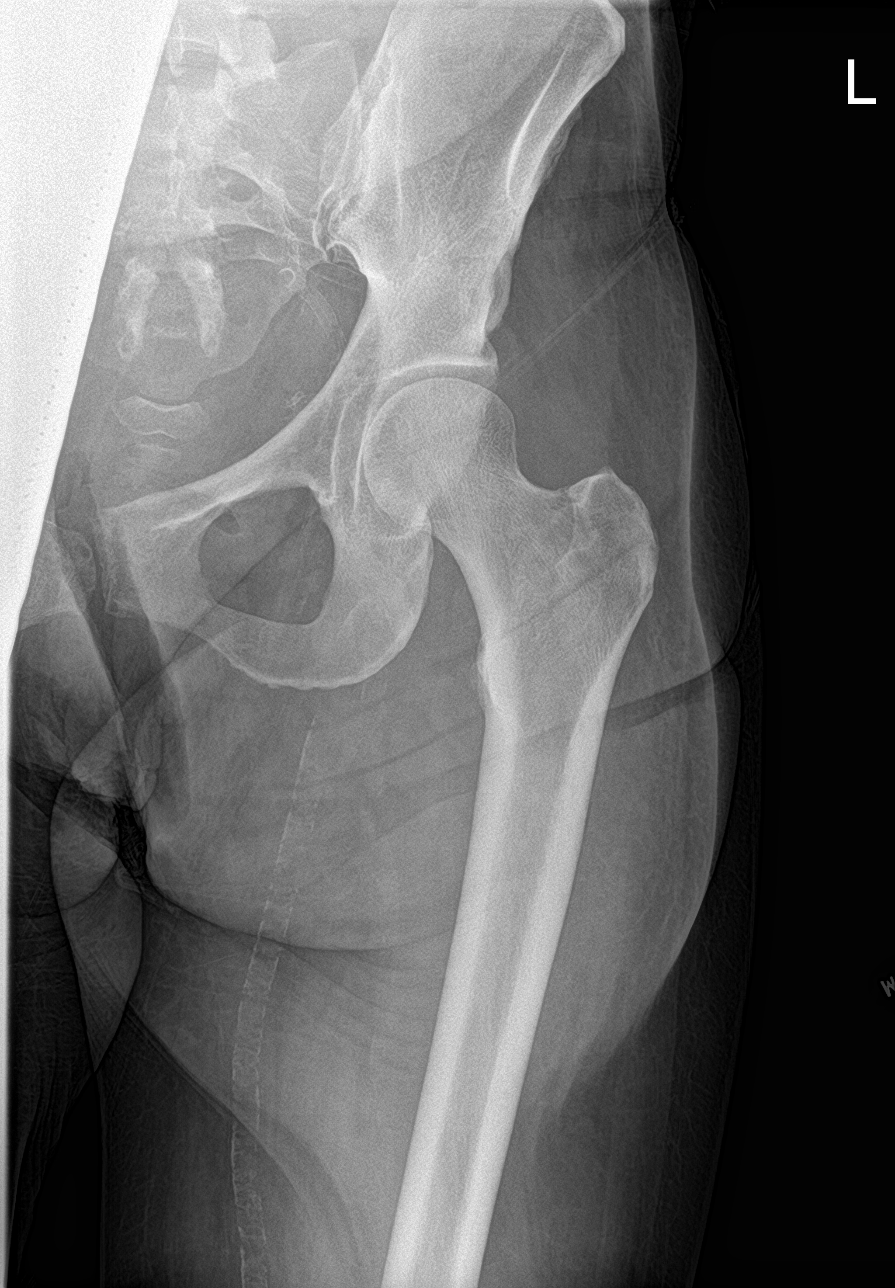

[femur ap (2 of 2)]
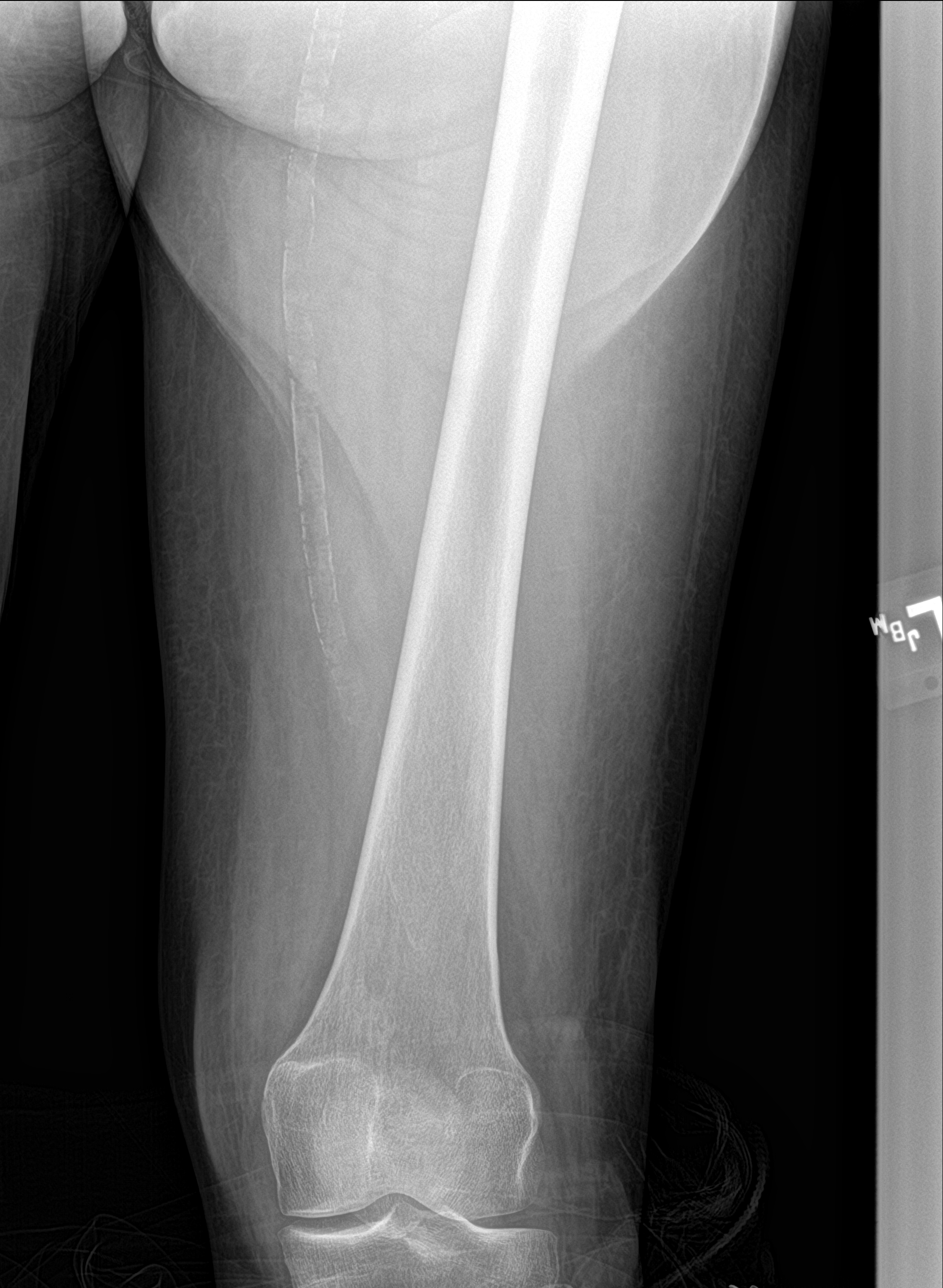

[femur lat (1 of 2)]
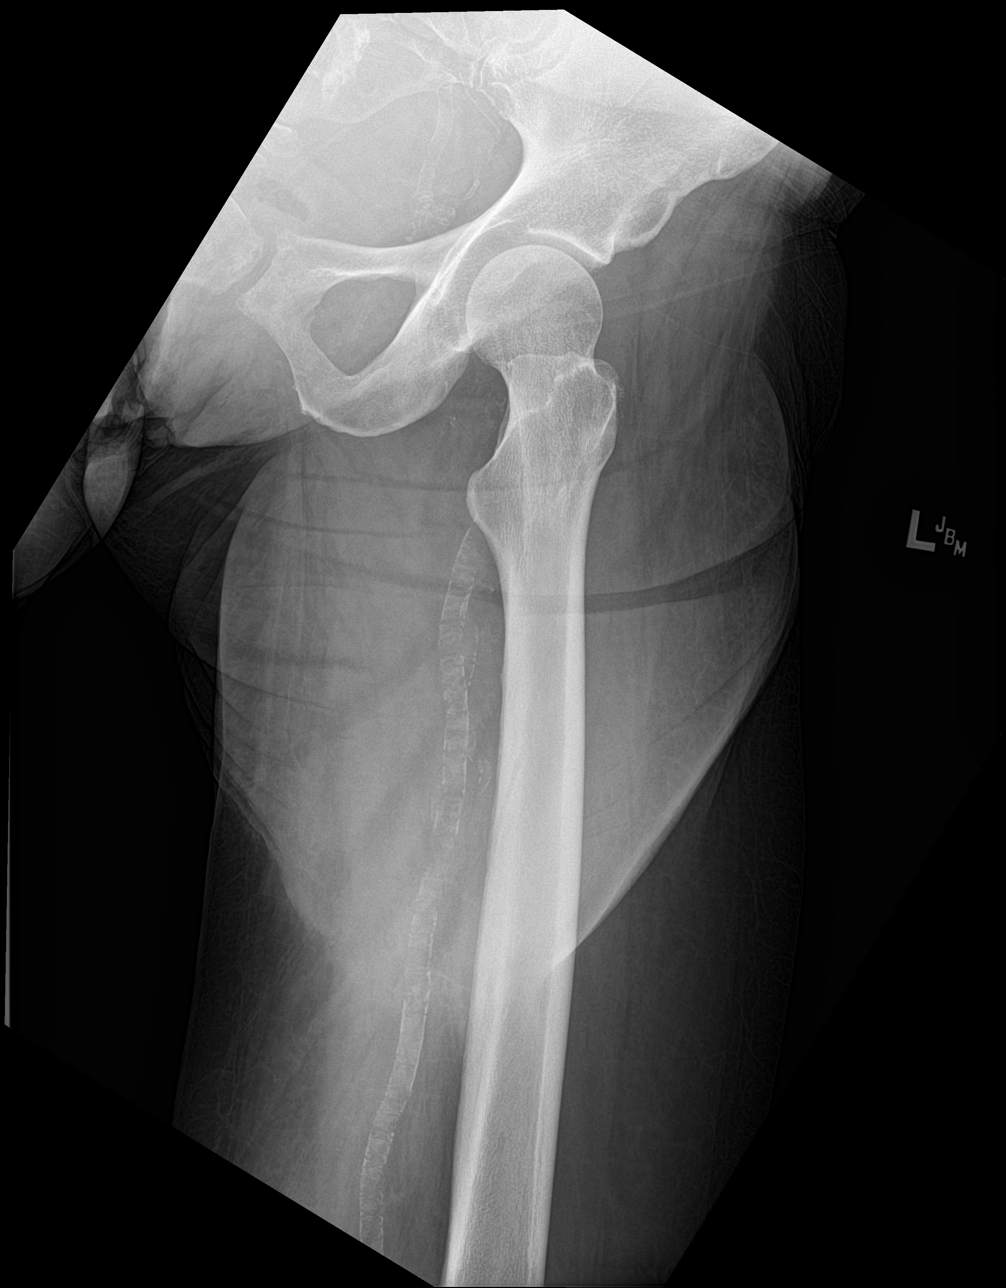

[femur lat (2 of 2)]
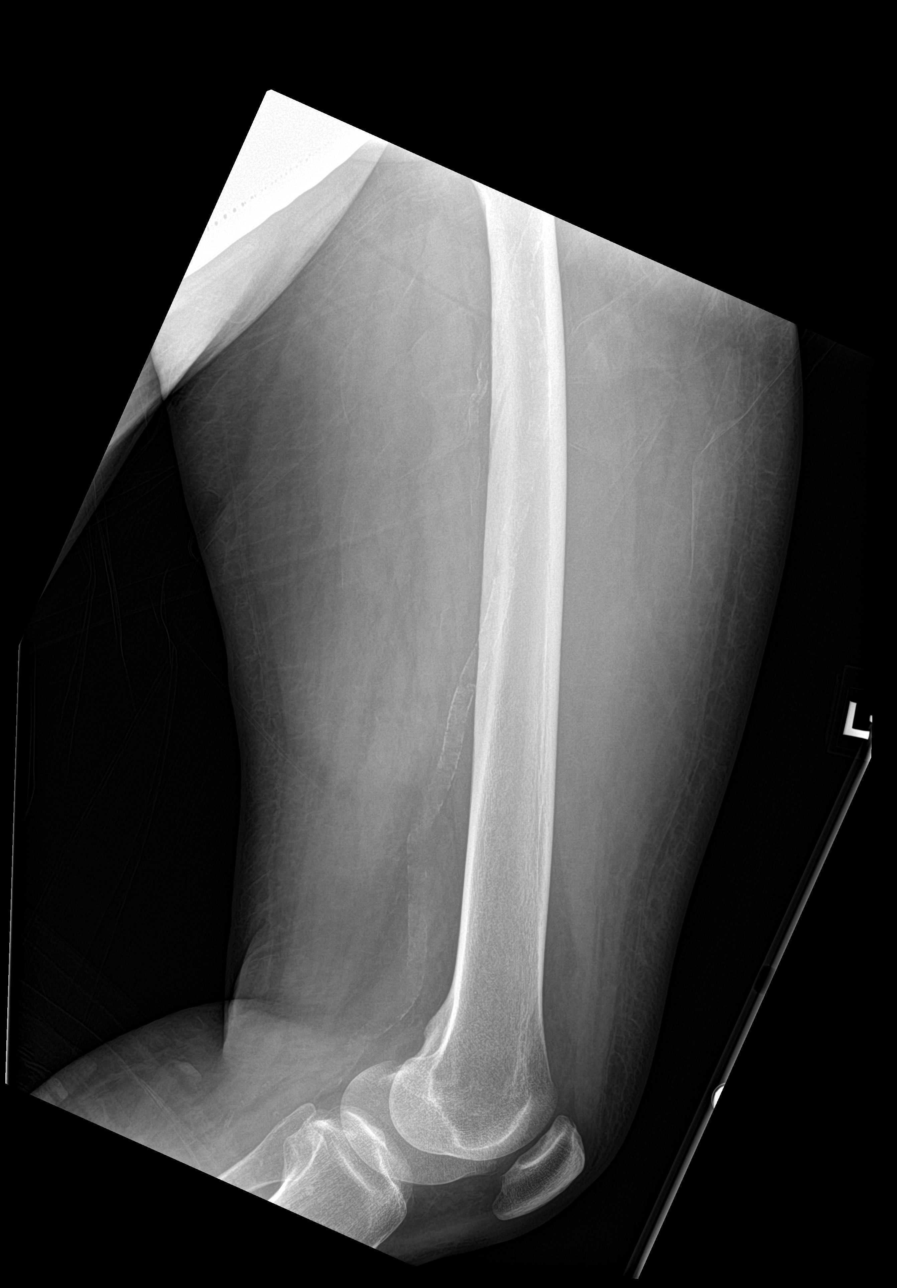

[4 of 4 positions shown; findings below may reference images not displayed]

FINDINGS: No acute fracture or dislocation. Limited views of the hip and knee
are grossly unremarkable. No acute soft tissue abnormality.
Prominent vascular calcifications noted.
IMPRESSION: 1. No acute fracture or dislocation.
2. Prominent soft tissue atherosclerotic changes for patient age.

## 2018-04-11 NOTE — ED Notes (Signed)
IV team at bedside 

## 2018-04-11 NOTE — ED Notes (Signed)
Pt stable, ambulatory, states understanding of discharge instructions 

## 2018-04-11 NOTE — ED Provider Notes (Signed)
[facility] EMERGENCY DEPARTMENT Provider Note   CSN: [id number] Arrival date & time: 04/11/18  1151     History   Chief Complaint No chief complaint on file.   HPI [name] is a 52 y.o. female.  Pt presents to the ED today with blood loss from her fistula site.  Pt is a dialysis pt who normally gets treatment T, TH, Sat.  She went for dialysis today and the needle slipped out and she was bleeding from the hole in her AVF.  The pt noticed bleeding and she was sent here.  She did not get any of her treatment.     Past Medical History:  Diagnosis Date  . Anemia   . Anxiety   . Bipolar disorder, unspecified ([place]) 02/06/2014  . Chronic combined systolic and diastolic CHF (congestive heart failure) (HCC)    EF 40% by echo and 48% by stress test  . Chronic pain syndrome   . Depression   . ESRD on hemodialysis ([place])   . GERD (gastroesophageal reflux disease)   . Glaucoma   . Headache    sinus headaches  . Hepatitis C   . High cholesterol   . History of hiatal hernia   . History of vitamin D deficiency 06/20/2015  . Hypertension   . MRSA infection ?2014   "on the back of my head; spread throughout my bloodstream"  . Neuropathy   . Refusal of blood transfusions as patient is Jehovah's Witness   . Schizoaffective disorder, unspecified condition 02/08/2014  . Seizure [facility]) dx'd 2014   "don't know what kind; last one was ~ 04/2014"  . Stroke [facility])    TIA 2009  . TIA (transient ischemic attack) 2010  . Type II diabetes mellitus ([place])    INSULIN DEPENDENT    Patient Active Problem List   Diagnosis Date Noted  . Localized edema due to fluid overload 09/05/2017  . Lower GI bleed   . Evaluation by psychiatric service required 07/17/2017  . Volume overload 06/03/2017  . Right supracondylar humerus fracture, with nonunion, subsequent encounter 05/01/2017  . Malnutrition of moderate degree 02/28/2017  . Leg pain, left 02/25/2017  . Left leg pain 02/25/2017    . External hemorrhoids   . Diverticulosis of colon without hemorrhage   . Goals of care, counseling/discussion   . Palliative care encounter   . Cellulitis of left leg 01/16/2017  . Diabetes mellitus with complication ([place]) 05/39/7673  . Upper GI bleed 01/16/2017  . UGIB (upper gastrointestinal bleed)   . Anemia due to chronic kidney disease   . Cellulitis of left thigh 01/01/2017  . ESRD on dialysis ([place]) 01/01/2017  . Cocaine use disorder, severe, dependence ([place]) 08/29/2016  . Hepatitis C 08/29/2016  . Diabetic ulcer of calf ([place]) 08/29/2016  . Diabetic ulcer of toe ([place]) 08/29/2016  . Cerebrovascular accident (CVA) ([place]) 08/29/2016  . MDD (major depressive disorder) 08/28/2016  . Dyslipidemia associated with type 2 diabetes mellitus ([place]) 11/10/2015  . Hyperkalemia 09/17/2015  . History of vitamin D deficiency 06/20/2015  . Long term prescription opiate use 06/20/2015  . Chronic pain syndrome 06/19/2015  . Anemia of chronic kidney failure 05/01/2015  . transmetatarsal amputation of the left foot 12/13/2014  . Unilateral visual loss 11/22/2014  . 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 ([place]) 11/22/2014  . Essential hypertension 11/09/2014  . Dizziness   . Chronic combined systolic (congestive) and diastolic (congestive) heart failure ([place])   . Rectal bleeding  04/23/2014  . Anemia, iron deficiency 04/13/2014  . Noncompliance 02/02/2014  . Hyponatremia 05/19/2013  . Seizure disorder ([place]) 02/09/2013    Past Surgical History:  Procedure Laterality Date  . ABDOMINAL HYSTERECTOMY  2014  . ABDOMINAL HYSTERECTOMY  2010  . AMPUTATION Left 05/21/2013   Procedure: Left Midfoot AMPUTATION;  Surgeon: Newt Minion, MD;  Location: [facility];  Service: Orthopedics;  Laterality: Left;  Left Midfoot Amputation  . AV FISTULA PLACEMENT Left 05/04/2015   Procedure: ARTERIOVENOUS (AV) GRAFT INSERTION;  Surgeon: Angelia Mould, MD;  Location: [facility];  Service: Vascular;  Laterality:  Left;  . COLONOSCOPY WITH PROPOFOL N/A 06/22/2013   Procedure: COLONOSCOPY WITH PROPOFOL;  Surgeon: Jeryl Columbia, MD;  Location: [facility] ENDOSCOPY;  Service: Endoscopy;  Laterality: N/A;  . ESOPHAGOGASTRODUODENOSCOPY N/A 05/11/2015   Procedure: ESOPHAGOGASTRODUODENOSCOPY (EGD);  Surgeon: Laurence Spates, MD;  Location: [facility] ENDOSCOPY;  Service: Endoscopy;  Laterality: N/A;  . EYE SURGERY Bilateral 2010   Lasik  . FLEXIBLE SIGMOIDOSCOPY N/A 01/17/2017   Procedure: FLEXIBLE SIGMOIDOSCOPY w/ hemorrhoid banding possible;  Surgeon: Gatha Mayer, MD;  Location: [facility] ENDOSCOPY;  Service: Endoscopy;  Laterality: N/A;  . FOOT AMPUTATION Left    due diabetic neuropathy, could not feel ulcer on bottom of foot  . REFRACTIVE SURGERY Bilateral 2013  . TONSILLECTOMY  1970's  . TUBAL LIGATION  2000     OB History    Gravida  0   Para  0   Term  0   Preterm  0   AB  0   Living        SAB  0   TAB  0   Ectopic  0   Multiple      Live Births               Home Medications    Prior to Admission medications   Medication Sig Start Date End Date Taking? Authorizing Provider  albuterol (PROVENTIL HFA;VENTOLIN HFA) 108 (90 Base) MCG/ACT inhaler Inhale 2 puffs into the lungs every 4 (four) hours as needed for wheezing or shortness of breath. 10/15/16   Horton, Barbette Hair, MD  albuterol (PROVENTIL) (2.5 MG/3ML) 0.083% nebulizer solution Take 2.5 mg by nebulization every 6 (six) hours as needed for wheezing or shortness of breath.    [provider]  allopurinol (ZYLOPRIM) 100 MG tablet Take 1 tablet (100 mg total) by mouth every Tuesday, Thursday, and Saturday at 6 PM. Patient not taking: Reported on 03/17/2018 01/25/17   Nita Sells, MD  Amino Acids-Protein Hydrolys (PRO-STAT) LIQD Take 30 mLs by mouth 2 (two) times daily.    [provider]  amLODipine (NORVASC) 10 MG tablet Take 1 tablet (10 mg total) by mouth at bedtime. 09/02/16   Hildred Priest, MD    atorvastatin (LIPITOR) 40 MG tablet Take 1 tablet (40 mg total) by mouth at bedtime. 09/02/16   Hildred Priest, MD  bisacodyl (DULCOLAX) 10 MG suppository Place 10 mg rectally daily as needed (for constipation).    [provider]  calcium acetate (PHOSLO) 667 MG capsule Take 2 capsules (1,334 mg total) by mouth 3 (three) times daily with meals. 09/02/16   Hildred Priest, MD  camphor-menthol [facility]) lotion Apply 1 application topically as needed for itching.    [provider]  carvedilol (COREG) 25 MG tablet Take 1 tablet (25 mg total) by mouth 2 (two) times daily with a meal. 09/02/16   Hildred Priest, MD  cloNIDine (CATAPRES) 0.1 MG tablet  Take 1 tablet (0.1 mg total) by mouth every 8 (eight) hours. Patient taking differently: Take 0.1 mg by mouth every 8 (eight) hours. Hold if SBP is less tha 100 or DBP is less than 60 for hypertension 09/02/16   Hildred Priest, MD  diphenhydrAMINE (BENADRYL) 25 mg capsule Take 25 mg by mouth every 6 (six) hours as needed for itching.     [provider]  docusate sodium (COLACE) 100 MG capsule Take 100 mg by mouth 2 (two) times daily.    [provider]  DULoxetine (CYMBALTA) 30 MG capsule Take 30 mg by mouth daily. Take with 60mg  to equal 90mg  once daily    [provider]  DULoxetine (CYMBALTA) 60 MG capsule Take 60 mg by mouth daily. Take with 30mg  to equal 90mg  once daily    [provider]  Ferrous Sulfate (SLOW FE) 142 (45 Fe) MG TBCR Take 1 tablet by mouth daily. Patient not taking: Reported on 09/09/2017 07/19/17   Mariel Aloe, MD  hydrocortisone (ANUSOL-HC) 2.5 % rectal cream Place rectally 2 (two) times daily. Patient taking differently: Place 1 application rectally 2 (two) times daily as needed for hemorrhoids.  01/24/17   Nita Sells, MD  hydrOXYzine (ATARAX/VISTARIL) 25 MG tablet Take 25 mg by mouth 2 (two) times daily as needed for itching.     [provider]  insulin aspart (NOVOLOG) 100 UNIT/ML injection Inject 7 Units into the skin 3 (three) times daily with meals. Patient taking differently: Inject 7 Units into the skin See admin instructions. 7 units into the skin three times a day before meals, plus a sliding scale: BGL 101-150 = 2 units; 151-200 = 3 units; 201-250 = 5 units; 251-300 = 7 units; 301-350 = 9 units; >350 = 11 units; >400 or <60 = CALL MD 06/07/17   Ina Homes, MD  Insulin Detemir (LEVEMIR FLEXTOUCH) 100 UNIT/ML Pen Inject 20 Units into the skin at bedtime.    [provider]  insulin glargine (LANTUS) 100 UNIT/ML injection Inject 0.2 mLs (20 Units total) into the skin at bedtime. Patient not taking: Reported on 09/09/2017 06/07/17   Ina Homes, MD  LORazepam (ATIVAN) 0.5 MG tablet Take 1 tablet (0.5 mg total) by mouth every 8 (eight) hours as needed for anxiety. Patient taking differently: Take 0.25 mg by mouth 2 (two) times daily.  07/19/17   Mariel Aloe, MD  methocarbamol (ROBAXIN) 500 MG tablet Take 500 mg by mouth 3 (three) times daily as needed for muscle spasms.    [provider]  multivitamin (RENA-VIT) TABS tablet Take 1 tablet by mouth at bedtime. Patient not taking: Reported on 09/09/2017 09/02/16   Hildred Priest, MD  neomycin-bacitracin-polymyxin (NEOSPORIN) ointment Apply 1 application topically daily as needed for wound care (until healed).    [provider]  Nutritional Supplements (FEEDING SUPPLEMENT, NEPRO CARB STEADY,) LIQD Take 237 mLs by mouth 3 (three) times daily between meals. Patient not taking: Reported on 09/09/2017 02/28/17   Cristal Ford, DO  Oxycodone HCl 10 MG TABS Take 1 tablet (10 mg total) by mouth every 6 (six) hours as needed. Patient taking differently: Take 10 mg by mouth every 6 (six) hours as needed (for pain).  07/19/17   Mariel Aloe, MD  pantoprazole (PROTONIX) 40 MG tablet Take 1 tablet (40 mg total) by mouth daily  at 6 (six) AM. 07/20/17   Mariel Aloe, MD  senna (SENOKOT) 8.6 MG tablet Take 2 tablets by mouth daily.  [provider]  sertraline (ZOLOFT) 50 MG tablet Take 1 tablet (50 mg total) by mouth daily. Patient not taking: Reported on 09/09/2017 07/19/17   Mariel Aloe, MD  Skin Protectants, Misc. (MINERIN) CREA Apply 1 application topically 2 (two) times daily.    [provider]  sucroferric oxyhydroxide (VELPHORO) 500 MG chewable tablet Chew 1,000 mg by mouth See admin instructions. Take 2 tablets by mouth for 3 times daily and 1 tablet daily as needed with snacks    [provider]  traZODone (DESYREL) 50 MG tablet Take 0.5 tablets (25 mg total) by mouth at bedtime. Patient not taking: Reported on 09/09/2017 07/19/17   Mariel Aloe, MD    Family History Family History  Problem Relation Age of Onset  . Hypertension Mother   . Diabetes Mother   . Hypertension Father   . Diabetes Father     Social History Social History   Tobacco Use  . Smoking status: Current Some Day Smoker    Packs/day: 0.10    Years: 30.00    Pack years: 3.00    Types: Cigarettes    Last attempt to quit: 03/31/2015    Years since quitting: 3.0  . Smokeless tobacco: Never Used  Substance Use Topics  . Alcohol use: Yes    Alcohol/week: 3.0 standard drinks    Types: 3 Standard drinks or equivalent per week    Frequency: Never    Comment: last week  . Drug use: Yes    Types: "Crack" cocaine, Cocaine    Comment: LAST USE YESTERDAY     Allergies   Penicillins; Gabapentin; Lyrica [pregabalin]; Saphris [asenapine]; Tramadol; Adhesive [tape]; Latex; and Other   Review of Systems Review of Systems  All other systems reviewed and are negative.    Physical Exam Updated Vital Signs Pulse 89   Temp 98 F (36.7 C) (Oral)   Resp 16   Ht 5\' 4"  (1.626 m)   Wt 70.3 kg   LMP  (LMP Unknown)   SpO2 100%   BMI 26.61 kg/m   Physical Exam  Constitutional: She is oriented to  person, place, and time. She appears well-developed and well-nourished.  HENT:  Head: Normocephalic and atraumatic.  Right Ear: External ear normal.  Left Ear: External ear normal.  Nose: Nose normal.  Mouth/Throat: Oropharynx is clear and moist.  Eyes: Pupils are equal, round, and reactive to light. Conjunctivae and EOM are normal.  Neck: Normal range of motion. Neck supple.  Cardiovascular: Normal rate, regular rhythm, normal heart sounds and intact distal pulses.  Pulmonary/Chest: Effort normal and breath sounds normal.  Abdominal: Soft. Bowel sounds are normal.  Musculoskeletal: Normal range of motion.  Left forearm avf.  No active bleeding.  Neurological: She is alert and oriented to person, place, and time.  Skin: Skin is warm. Capillary refill takes less than 2 seconds.  Psychiatric: She has a normal mood and affect. Her behavior is normal. Judgment and thought content normal.  Nursing note and vitals reviewed.    ED Treatments / Results  Labs (all labs ordered are listed, but only abnormal results are displayed) Labs Reviewed  BASIC METABOLIC PANEL - Abnormal; Notable for the following components:      Result Value   Potassium 6.0 (*)    Chloride 94 (*)    Glucose, Bld 316 (*)    Creatinine, Ser 4.28 (*)    Calcium 8.4 (*)    GFR calc non Af Amer 11 (*)  GFR calc Af Amer 13 (*)    All other components within normal limits  CBC - Abnormal; Notable for the following components:   WBC 3.9 (*)    MCV 102.0 (*)    RDW 16.4 (*)    All other components within normal limits    EKG None  Radiology No results found.  Procedures Procedures (including critical care time)  Medications Ordered in ED Medications - No data to display   Initial Impression / Assessment and Plan / ED Course  I have reviewed the triage vital signs and the nursing notes.  Pertinent labs & imaging results that were available during my care of the patient were reviewed by me and  considered in my medical decision making (see chart for details).    Potassium is slightly high, but hemolysis is noted.  EMS said she did not get much of her treatment, but pt now said she'd been there for hours and thinks she got most of it.  She does not want to go back to dialysis and wants to go home.  She knows to return at any time.  Final Clinical Impressions(s) / ED Diagnoses   Final diagnoses:  Dialysis AV fistula malfunction, initial encounter [facility])    ED Discharge Orders    None       Isla Pence, MD 04/11/18 1315

## 2018-04-11 NOTE — ED Triage Notes (Signed)
Pt arrived via [facility] from dialysis center, EMS reports needle slipped out and staff estimated 324ml blood loss.  No dialysis treatment completed.

## 2018-04-11 NOTE — ED Notes (Signed)
Got patient on the monitor patient is resting with call [place] in reach  ?

## 2018-04-12 IMAGING — MR MR FEMUR*L* W/O CM
4 of 6 series · 19 of 40 positions shown · non-contrast
Comparison: None.

CLINICAL DATA: Increasing left thigh pain and swelling for 2 days
in patient with a history of end-stage renal disease and drug abuse.

EXAM:
MR OF THE LEFT FEMUR WITHOUT CONTRAST
TECHNIQUE: Multiplanar, multisequence MR imaging of the left upper leg was
performed. No intravenous contrast was administered.

[Series 3: T1 · axial · 7.0mm · 0.76mm/px · z∈[-219,+149]mm · 5 of 48 slices shown (1 of 3)]
[im 1/48]
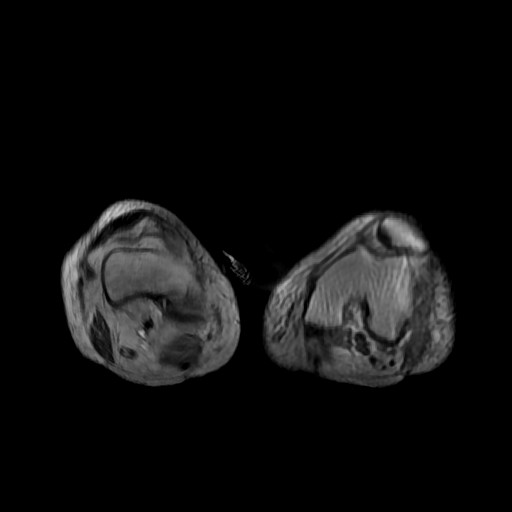
[im 6/48]
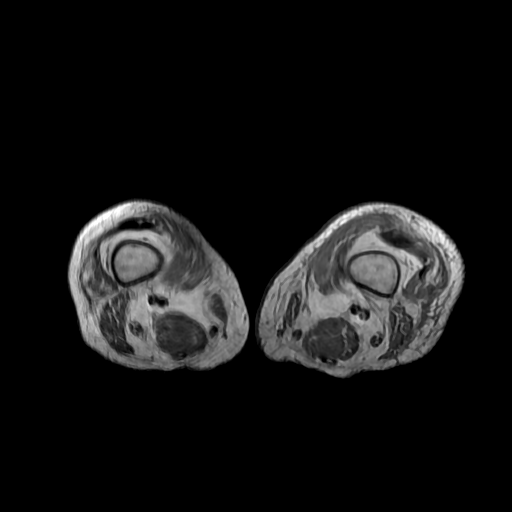
[im 16/48]
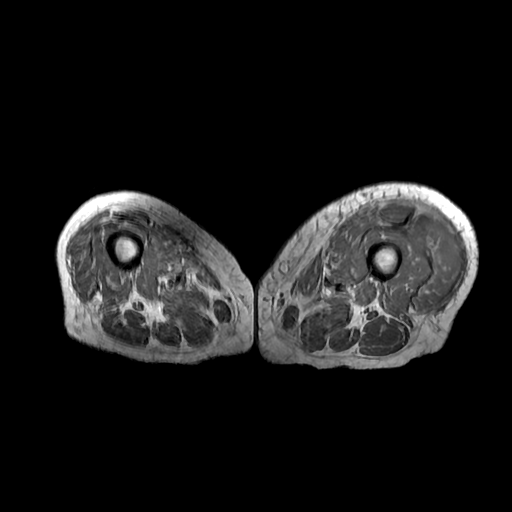
[im 27/48]
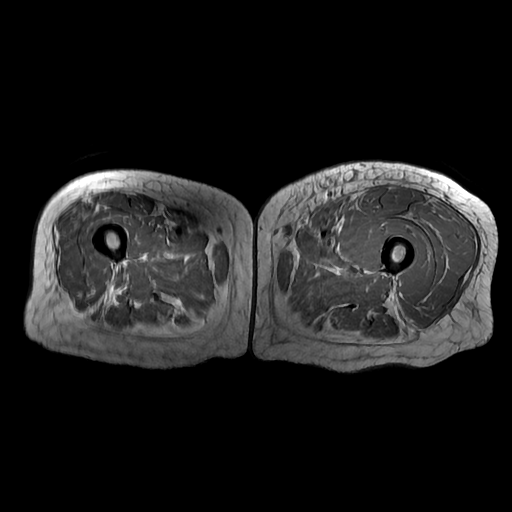
[im 42/48]
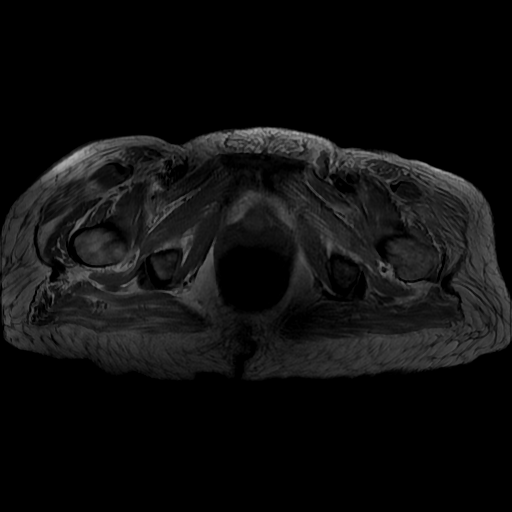

[Series 4: T2 fat-sat · axial · 7.0mm · 0.76mm/px · z∈[-219,+203]mm · 8 of 48 slices shown]
[im 1/48]
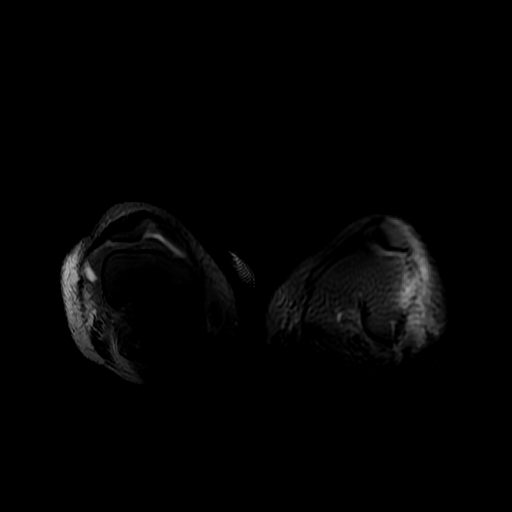
[im 6/48]
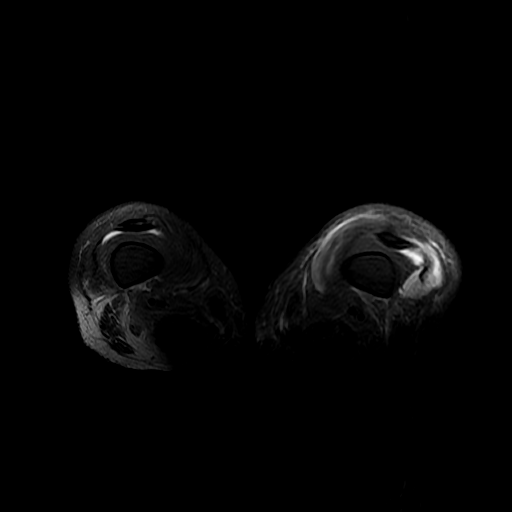
[im 16/48]
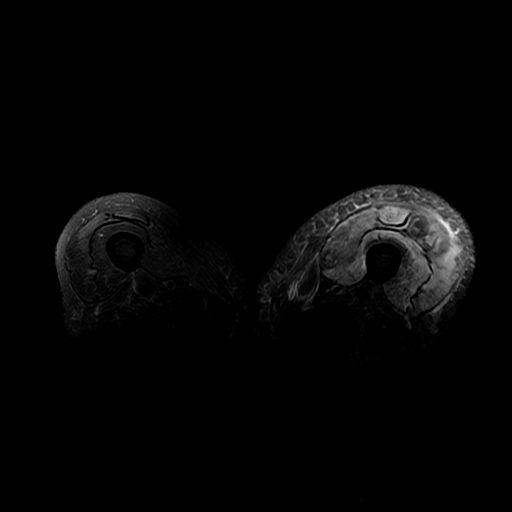
[im 21/48]
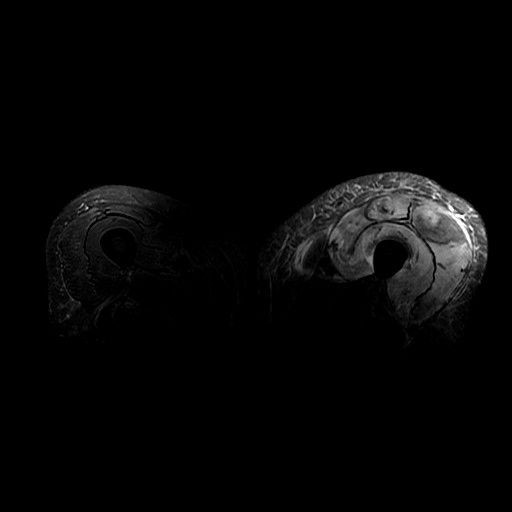
[im 27/48]
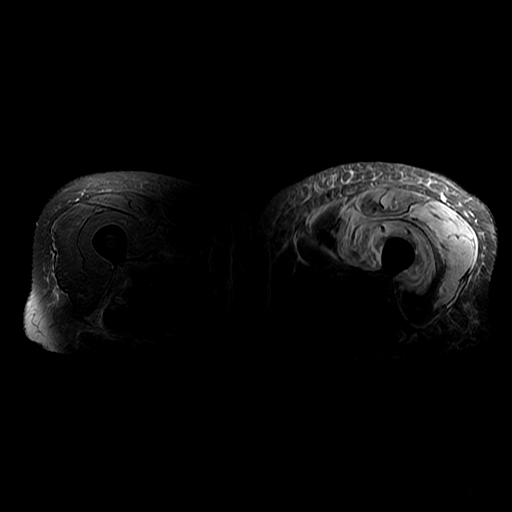
[im 32/48]
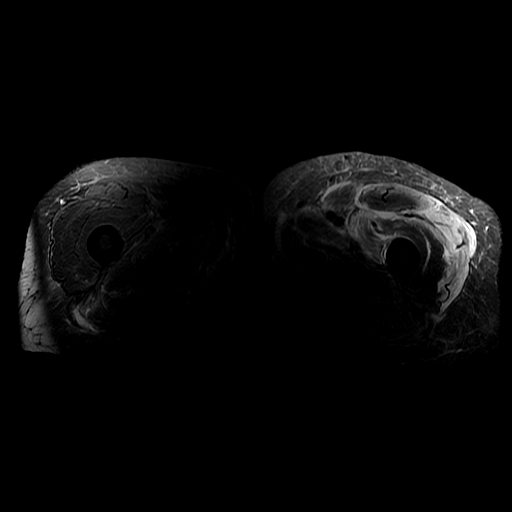
[im 42/48]
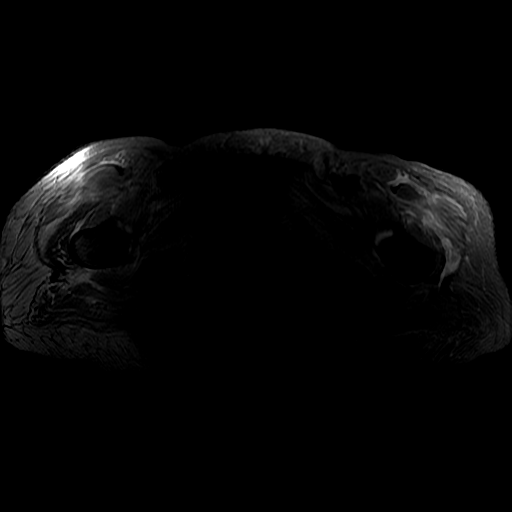
[im 48/48]
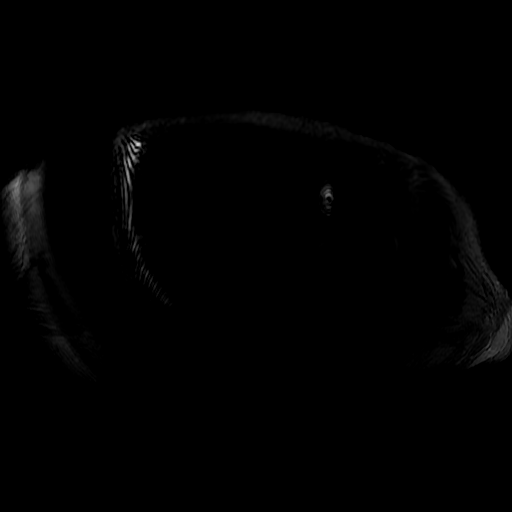

[Series 6: T1 · coronal · 6.0mm · 0.84mm/px · 3 of 25 slices shown (2 of 3)]
[im 1/25]
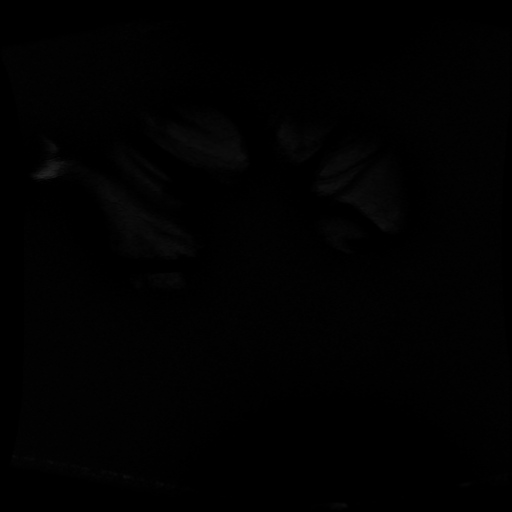
[im 13/25]
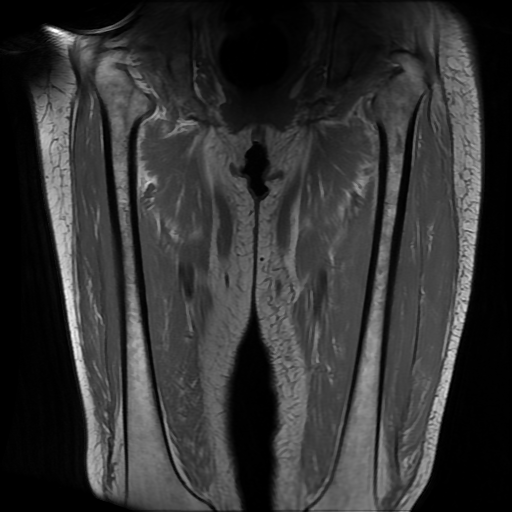
[im 25/25]
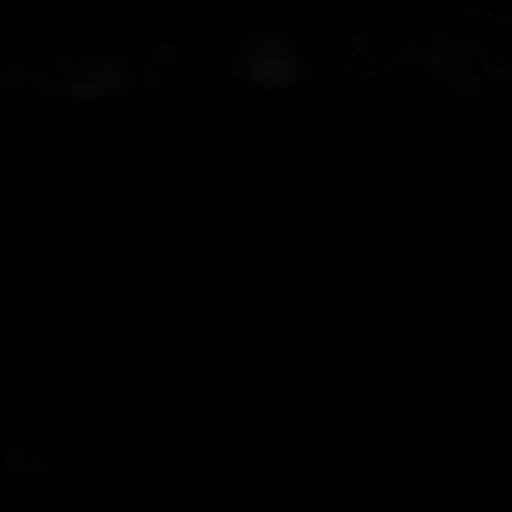

[Series 9: T1 · sagittal · 6.0mm · 0.90mm/px · 3 of 27 slices shown (3 of 3)]
[im 1/27]
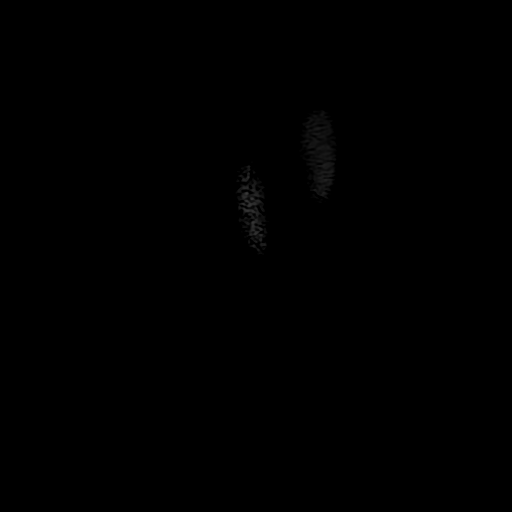
[im 14/27]
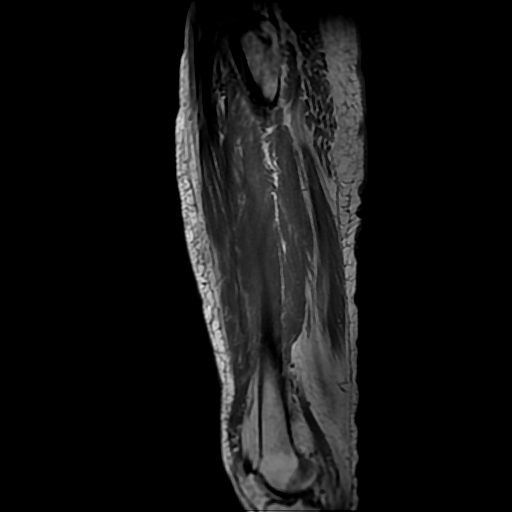
[im 27/27]
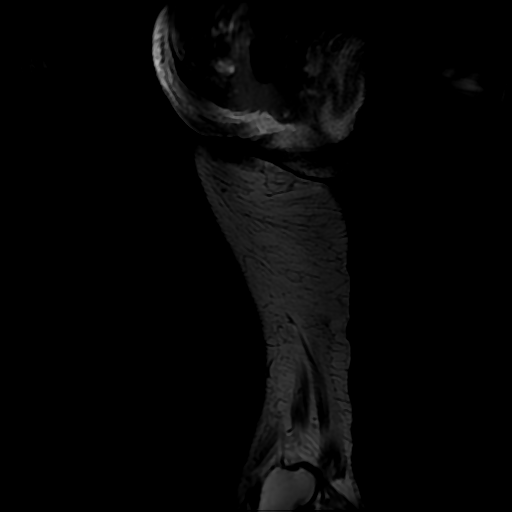

[19 of 40 positions shown; findings below may reference images not displayed]

FINDINGS: Bones/Joint/Cartilage

The axial and coronal sequences incidentally include the right upper
leg. Bone marrow signal is normal without fracture, stress change or
evidence of osteomyelitis. No avascular necrosis of the femoral
heads is identified.

Ligaments

Negative.

Muscles and Tendons

Intense edema is present throughout the musculature of the anterior
compartment of left upper leg. No intramuscular fluid collection is
identified. Much milder degree of edema is seen in the distal right
vastus lateralis and intermedius. No muscle or tendon tear or strain
is identified.

Soft tissues

Bilateral upper leg subcutaneous edema is much worse on the left.
Small bilateral knee joint effusions are identified.
IMPRESSION: Edema throughout the anterior compartment musculature of the left
thigh most worrisome for rhabdomyolysis. Compartment syndrome and
infectious myositis far possible but thought less likely. Much
milder degree of edema is present in the right vastus lateralis and
intermedius.

Negative for abscess or osteomyelitis.

## 2018-04-27 IMAGING — CR DG FEMUR 2+V*L*
4 series · 4 of 4 positions shown · non-contrast
Comparison: None.

CLINICAL DATA: Pain, soft tissue mass, ongoing for 2 weeks

EXAM:
LEFT FEMUR 2 VIEWS

[femur ap (1 of 2)]
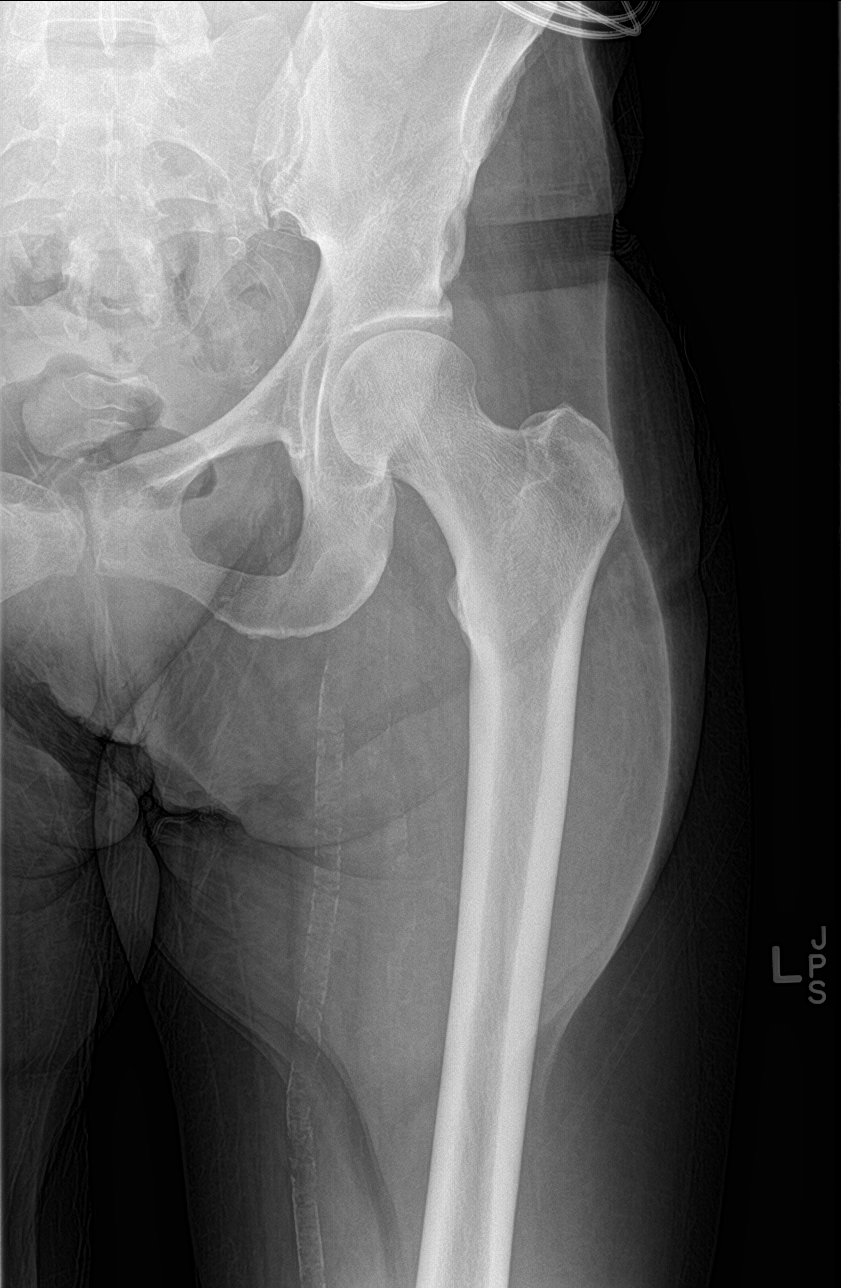

[femur ap (2 of 2)]
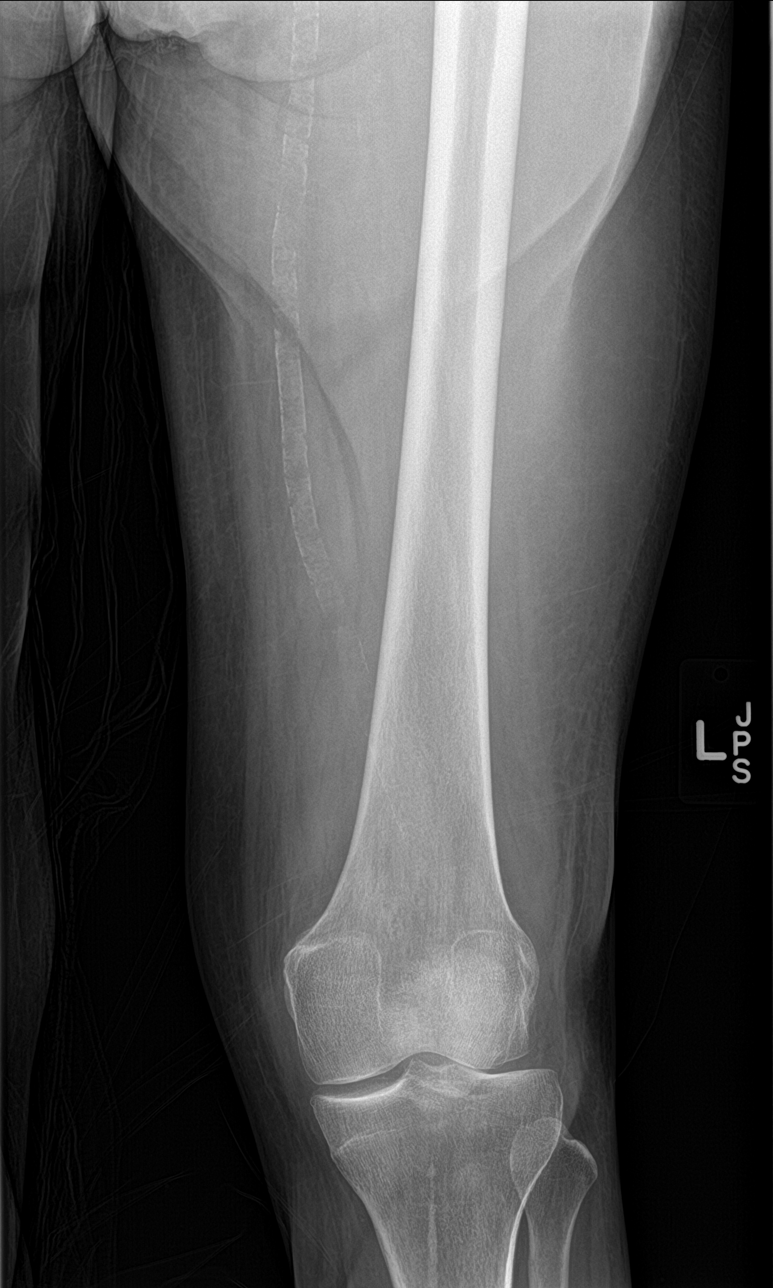

[femur lat (1 of 2)]
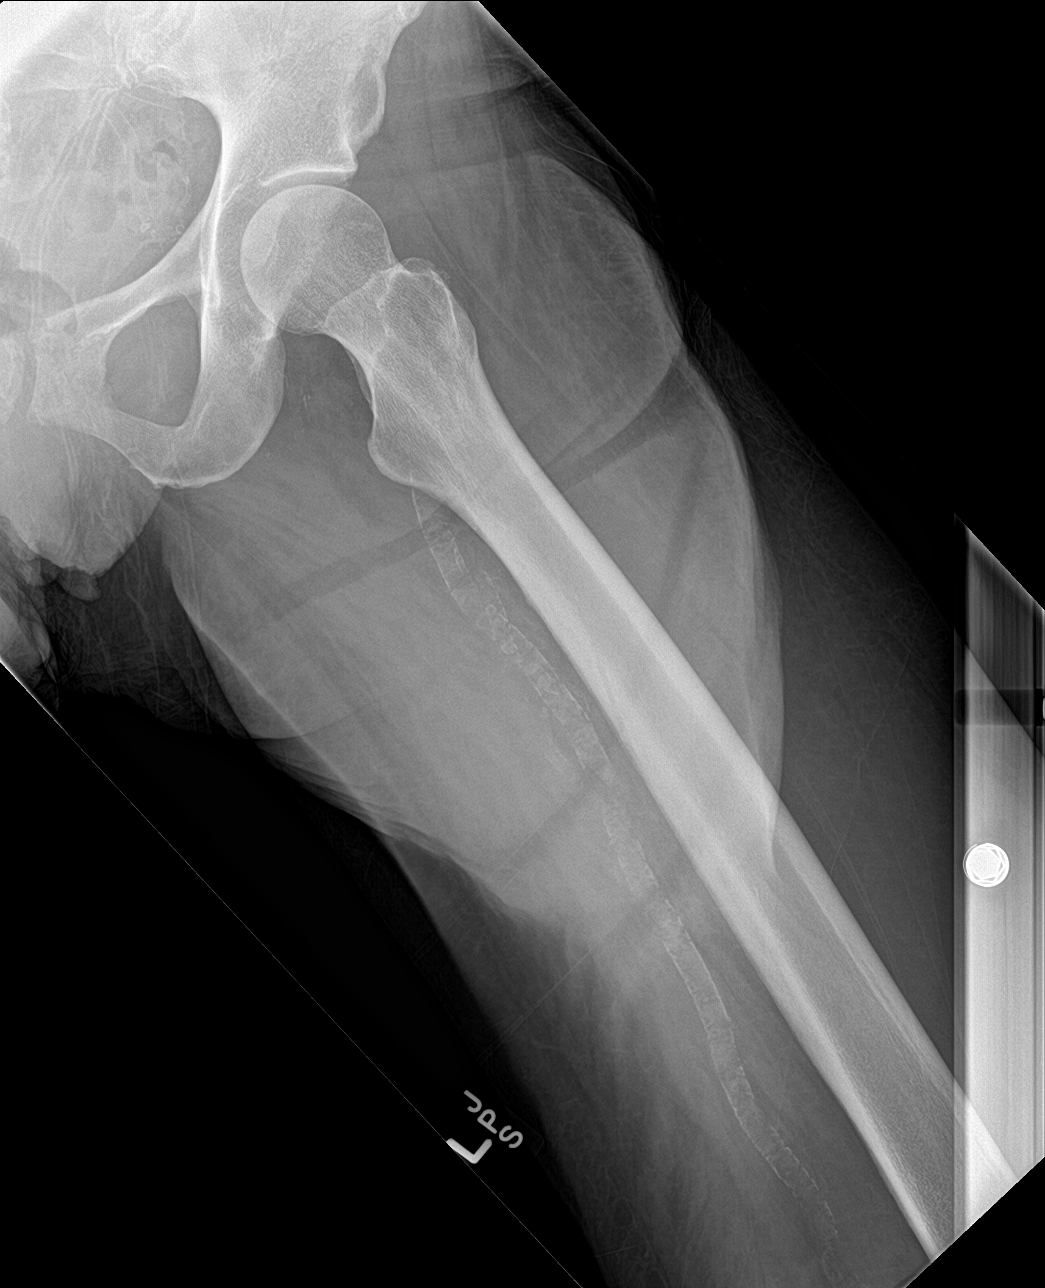

[femur lat (2 of 2)]
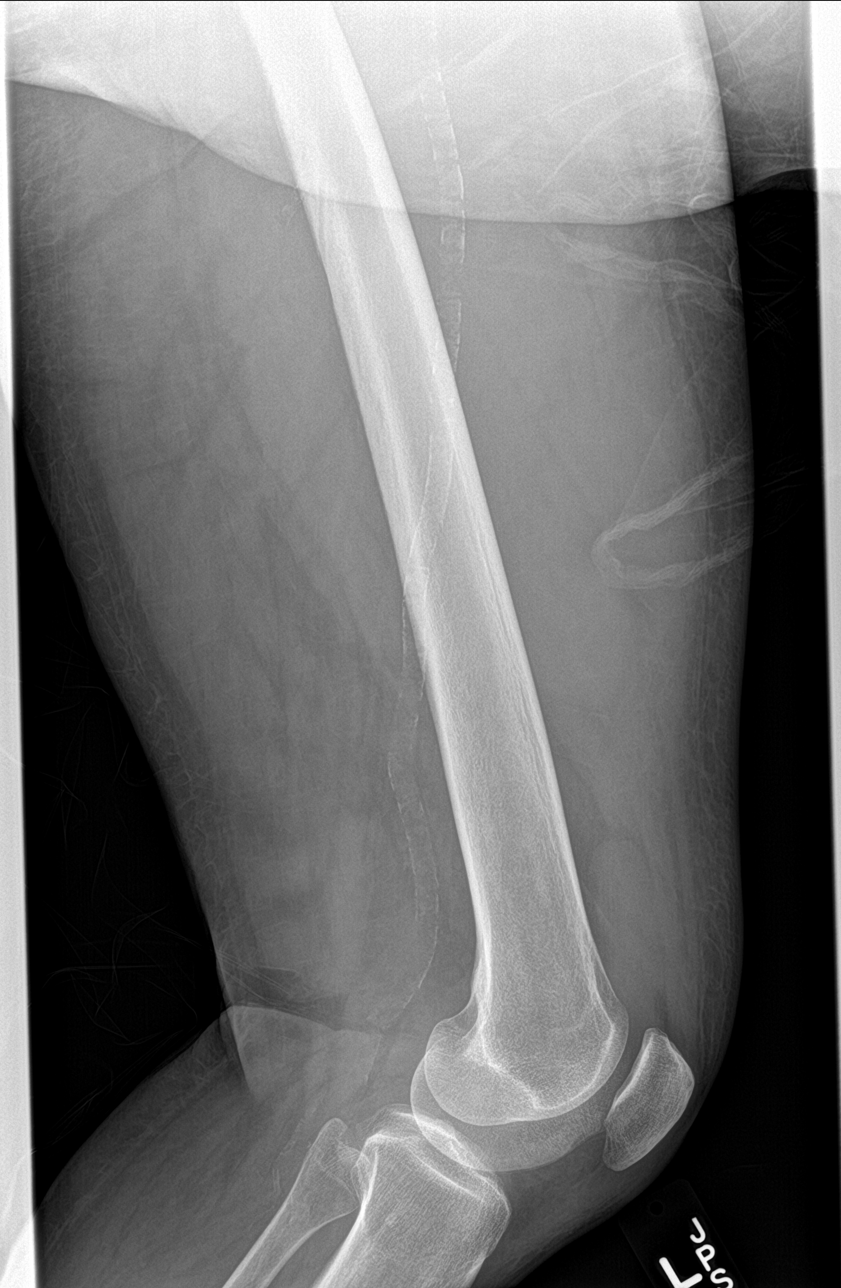

[4 of 4 positions shown; findings below may reference images not displayed]

FINDINGS: No fracture or dislocation. No aggressive lytic or sclerotic osseous
lesion. Peripheral vascular atherosclerotic disease. No focal soft
tissue abnormality.
IMPRESSION: No acute osseous injury of the left femur. If there is persisting
concern regarding soft tissue abnormality recommend evaluation with
MRI.

## 2018-04-27 IMAGING — MR MR FEMUR*L* W/O CM
4 of 7 series · 19 of 40 positions shown · non-contrast
Comparison: 01/01/2017

CLINICAL DATA: Left leg mass over the past 2 weeks. Partial
amputation of the left foot.

EXAM:
MR OF THE LEFT FEMUR WITHOUT CONTRAST
TECHNIQUE: Multiplanar, multisequence MR imaging of the left upper leg/femur
was performed. No intravenous contrast was administered.

[Series 3: T1 · axial · 7.0mm · 0.84mm/px · z∈[-277,+119]mm · 5 of 53 slices shown (1 of 3)]
[im 1/53]
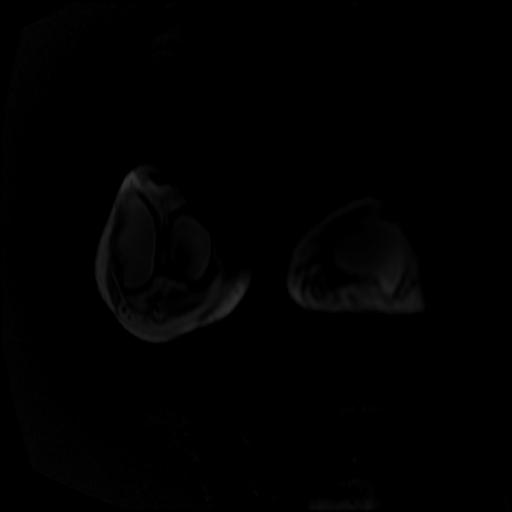
[im 8/53]
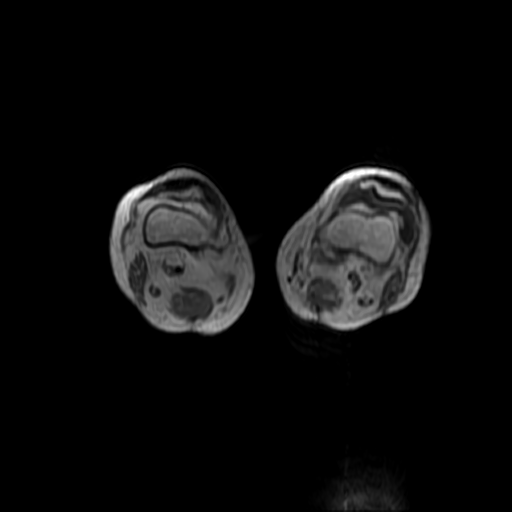
[im 15/53]
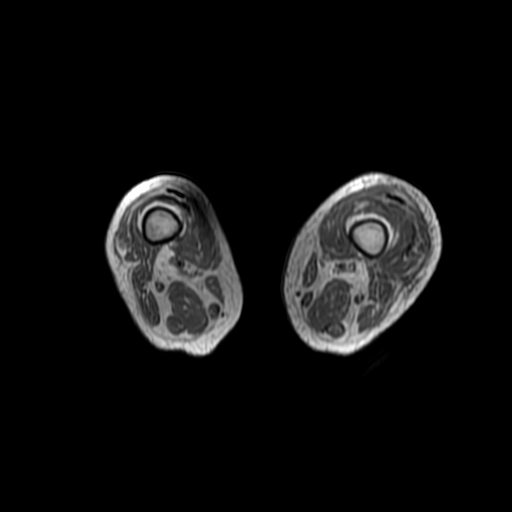
[im 30/53]
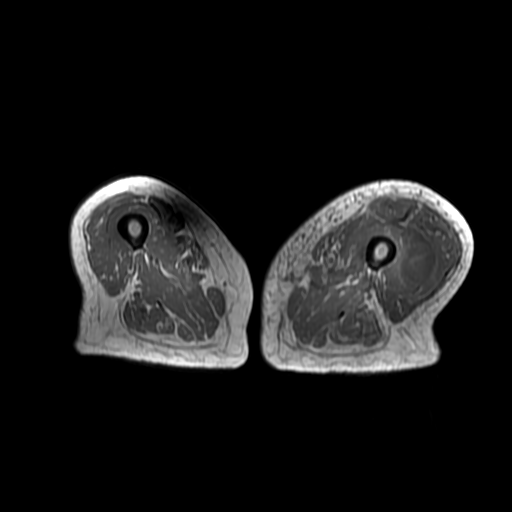
[im 45/53]
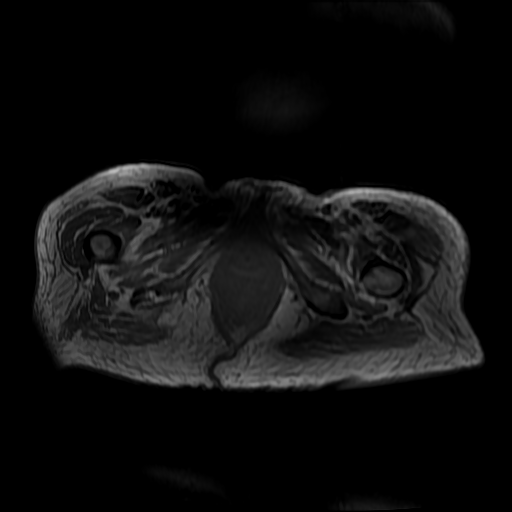

[Series 5: T2 fat-sat · axial · 7.0mm · 0.84mm/px · z∈[-277,+191]mm · 8 of 53 slices shown]
[im 1/53]
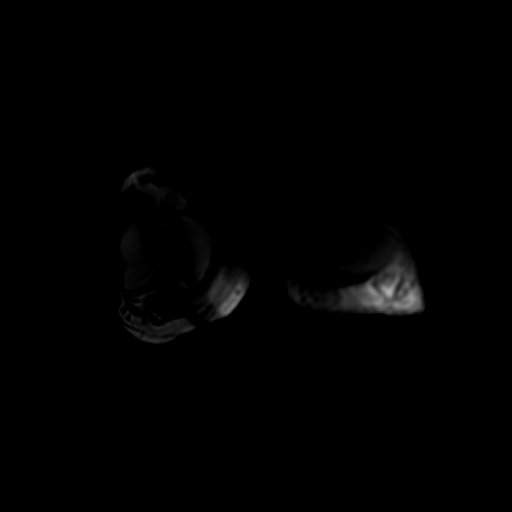
[im 8/53]
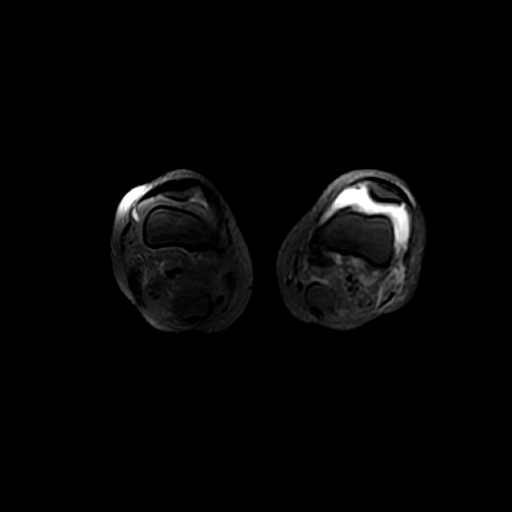
[im 15/53]
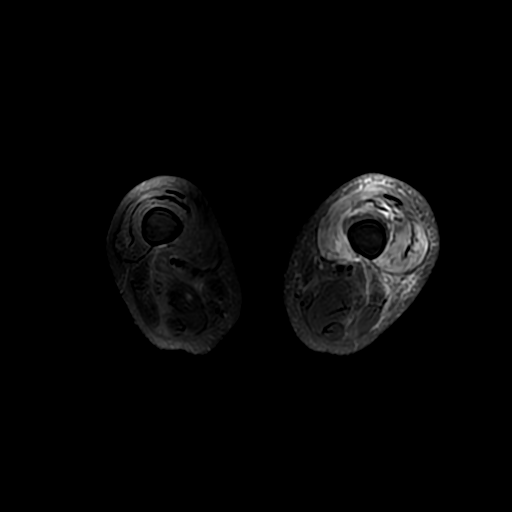
[im 23/53]
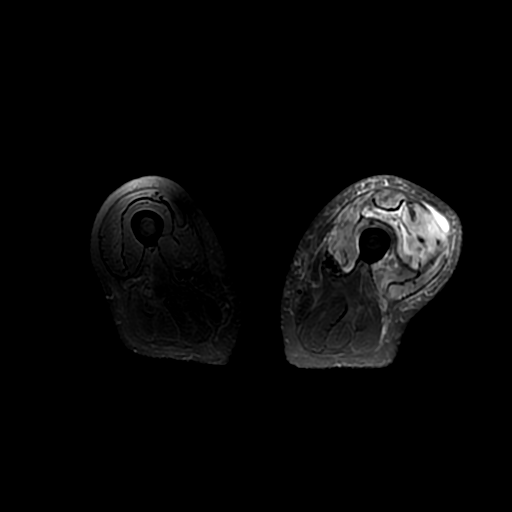
[im 30/53]
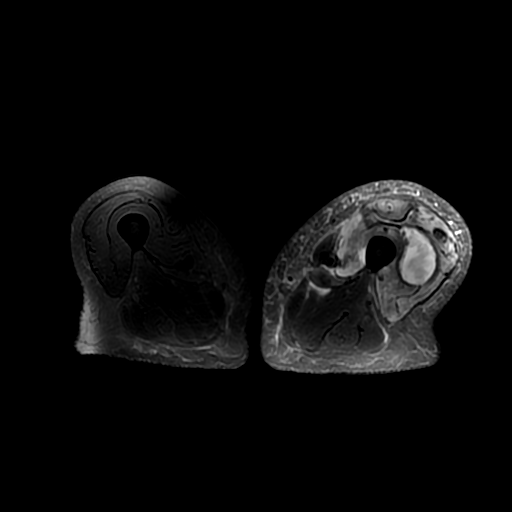
[im 38/53]
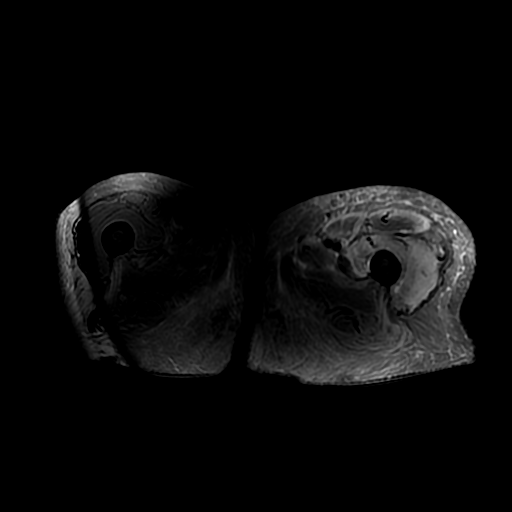
[im 45/53]
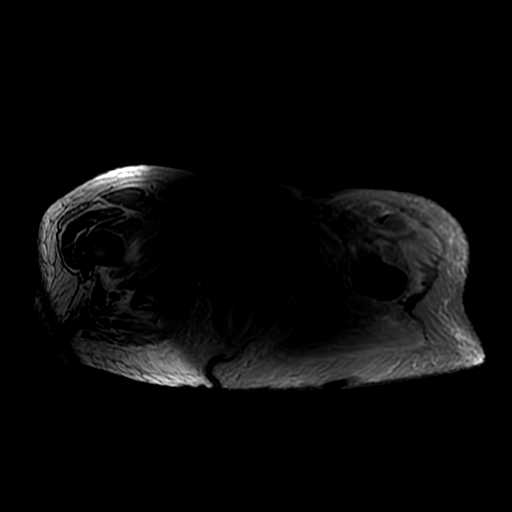
[im 53/53]
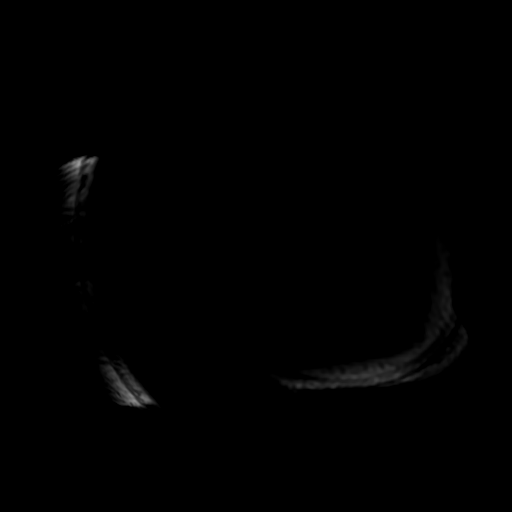

[Series 7: T1 · coronal · 5.0mm · 0.96mm/px · 3 of 28 slices shown (2 of 3)]
[im 1/28]
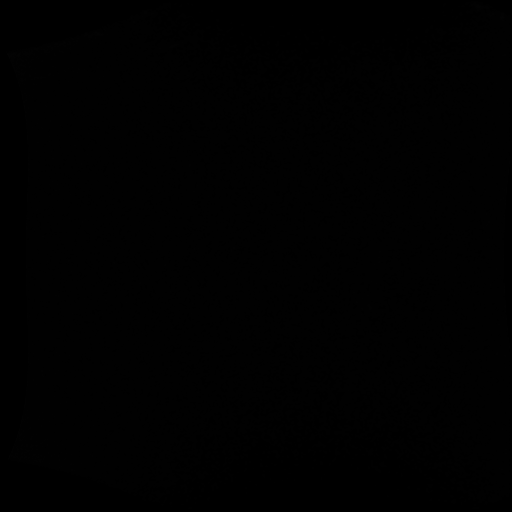
[im 19/28]
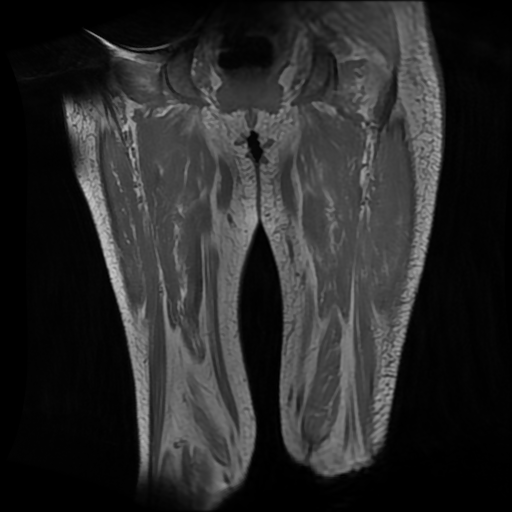
[im 28/28]
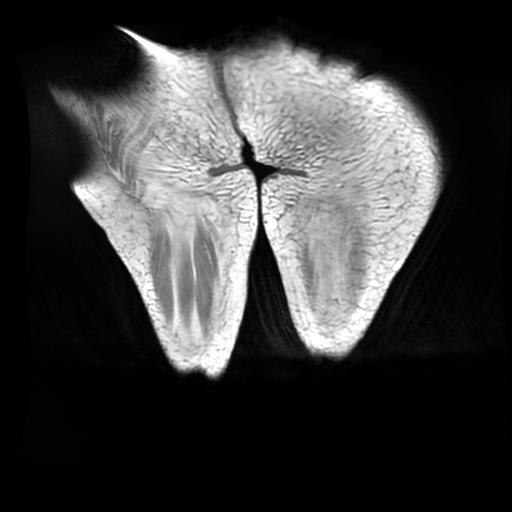

[Series 8: T1 · sagittal · 5.0mm · 0.94mm/px · 3 of 31 slices shown (3 of 3)]
[im 1/31]
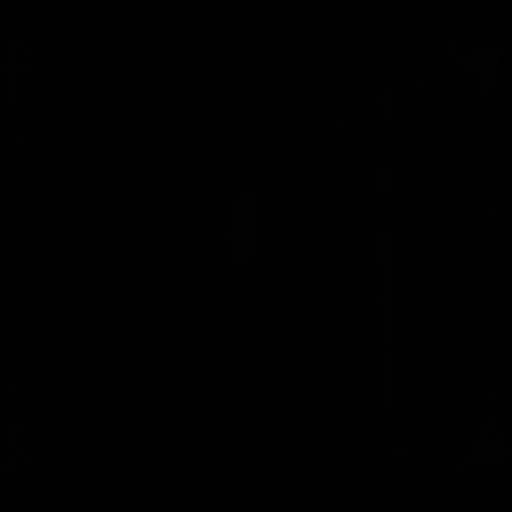
[im 21/31]
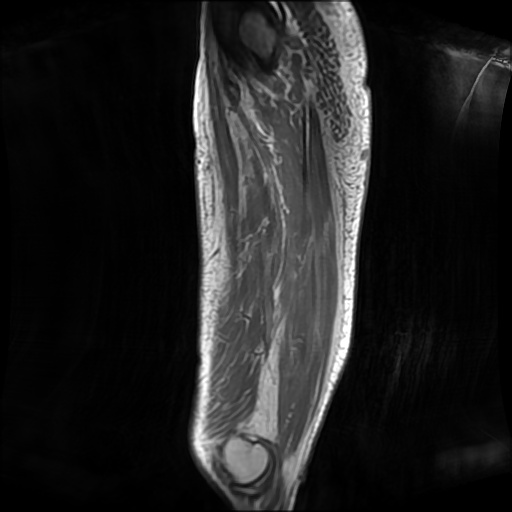
[im 31/31]
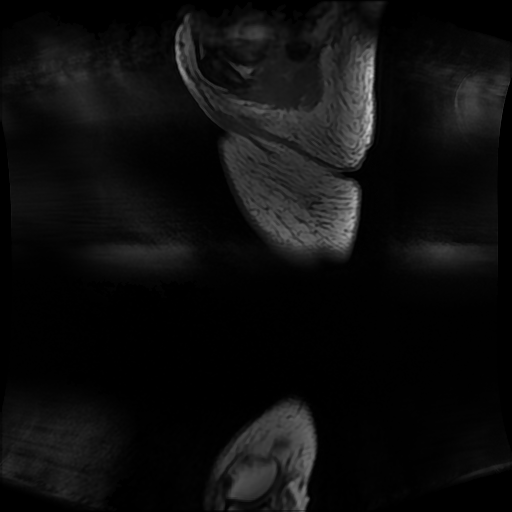

[19 of 40 positions shown; findings below may reference images not displayed]

FINDINGS: Despite efforts by the technologist and patient, motion artifact is
present on today's exam and could not be eliminated. This reduces
exam sensitivity and specificity.

Bones/Joint/Cartilage

No significant abnormal osseous edema. Moderate left and trace right
knee joint effusion.

Ligaments

N/A

Muscles and Tendons

Extensive abnormal edema in the anterior compartmental musculature
of the upper leg, with new lower progressive heterogeneous but high
T2 signal intensity fluid collection primarily tracking within along
the vastus lateralis and vastus intermedius muscles, measuring
by 5.6 by 18.9 cm (volume = 400 cm^3). This could represent
abscess or hematoma. There is some disruption of portions of the
vastus lateralis, and edema signal is again identified in all
anterior compartmental musculature. There is also some edema
tracking within and around the pectineus and adductor longus
muscles.

Soft tissues

Subcutaneous edema in the upper thighs, left greater than right.
IMPRESSION: 1. Abnormal extensive edema in the anterior compartment of the left
upper leg, with some mild involvement of the medial compartment.
Compartment syndrome and infection not excluded. Compared to the
prior exam there has been progressive enlargement of a fluid
collection primarily involving the vastus lateralis and vastus
intermedius, current volume 400 cubic cm, potentially abscess or
hematoma.
2. Moderate left and trace right knee effusions.

## 2018-04-29 ENCOUNTER — Ambulatory Visit: Payer: Medicare Other | Admitting: Neurology

## 2018-04-30 ENCOUNTER — Encounter: Payer: Self-pay | Admitting: Neurology

## 2018-06-01 ENCOUNTER — Ambulatory Visit (INDEPENDENT_AMBULATORY_CARE_PROVIDER_SITE_OTHER): Payer: Medicare Other | Admitting: Orthopedic Surgery

## 2018-06-06 IMAGING — CR DG FEMUR 2+V*L*
4 series · 4 of 4 positions shown · non-contrast
Comparison: 01/16/2017

CLINICAL DATA: Leg pain and swelling

EXAM:
LEFT FEMUR 2 VIEWS

[femur ap (1 of 2)]
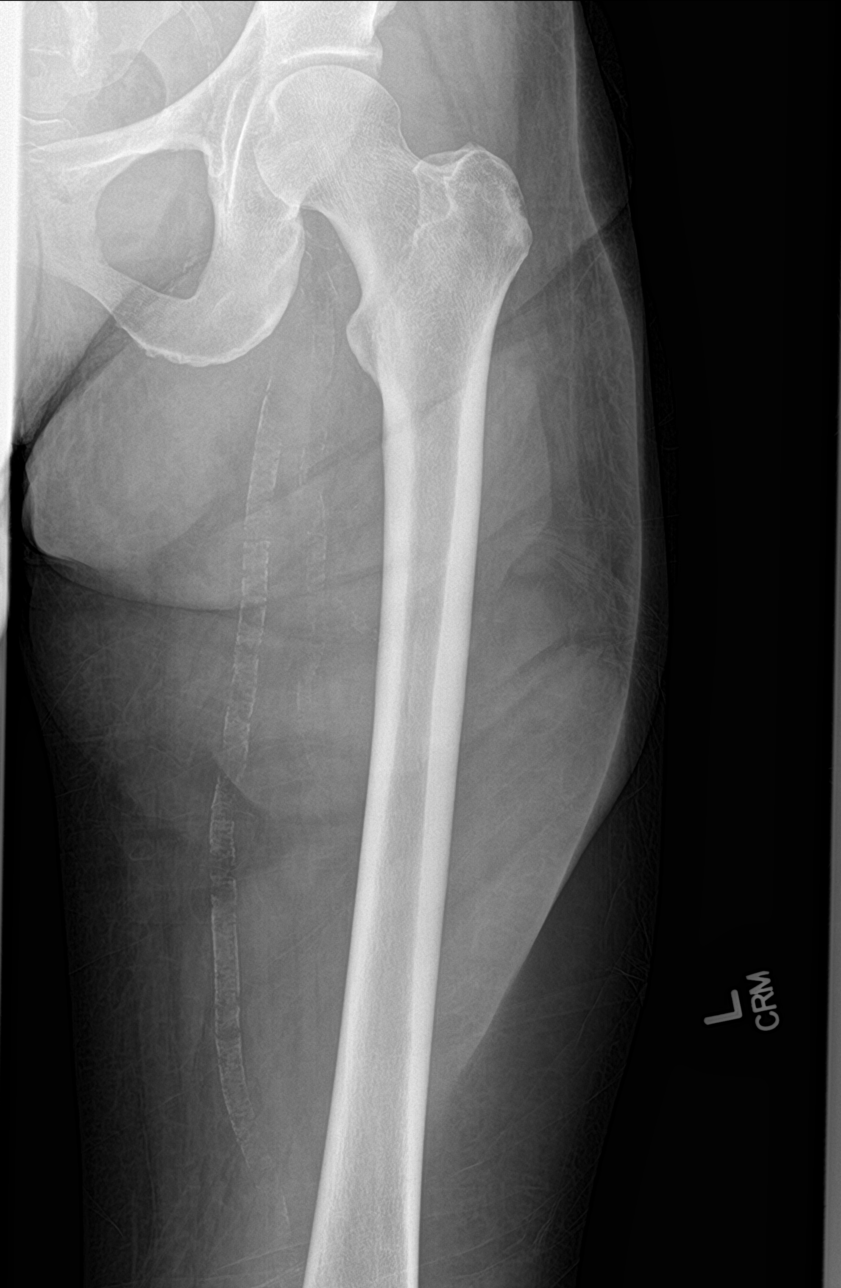

[femur ap (2 of 2)]
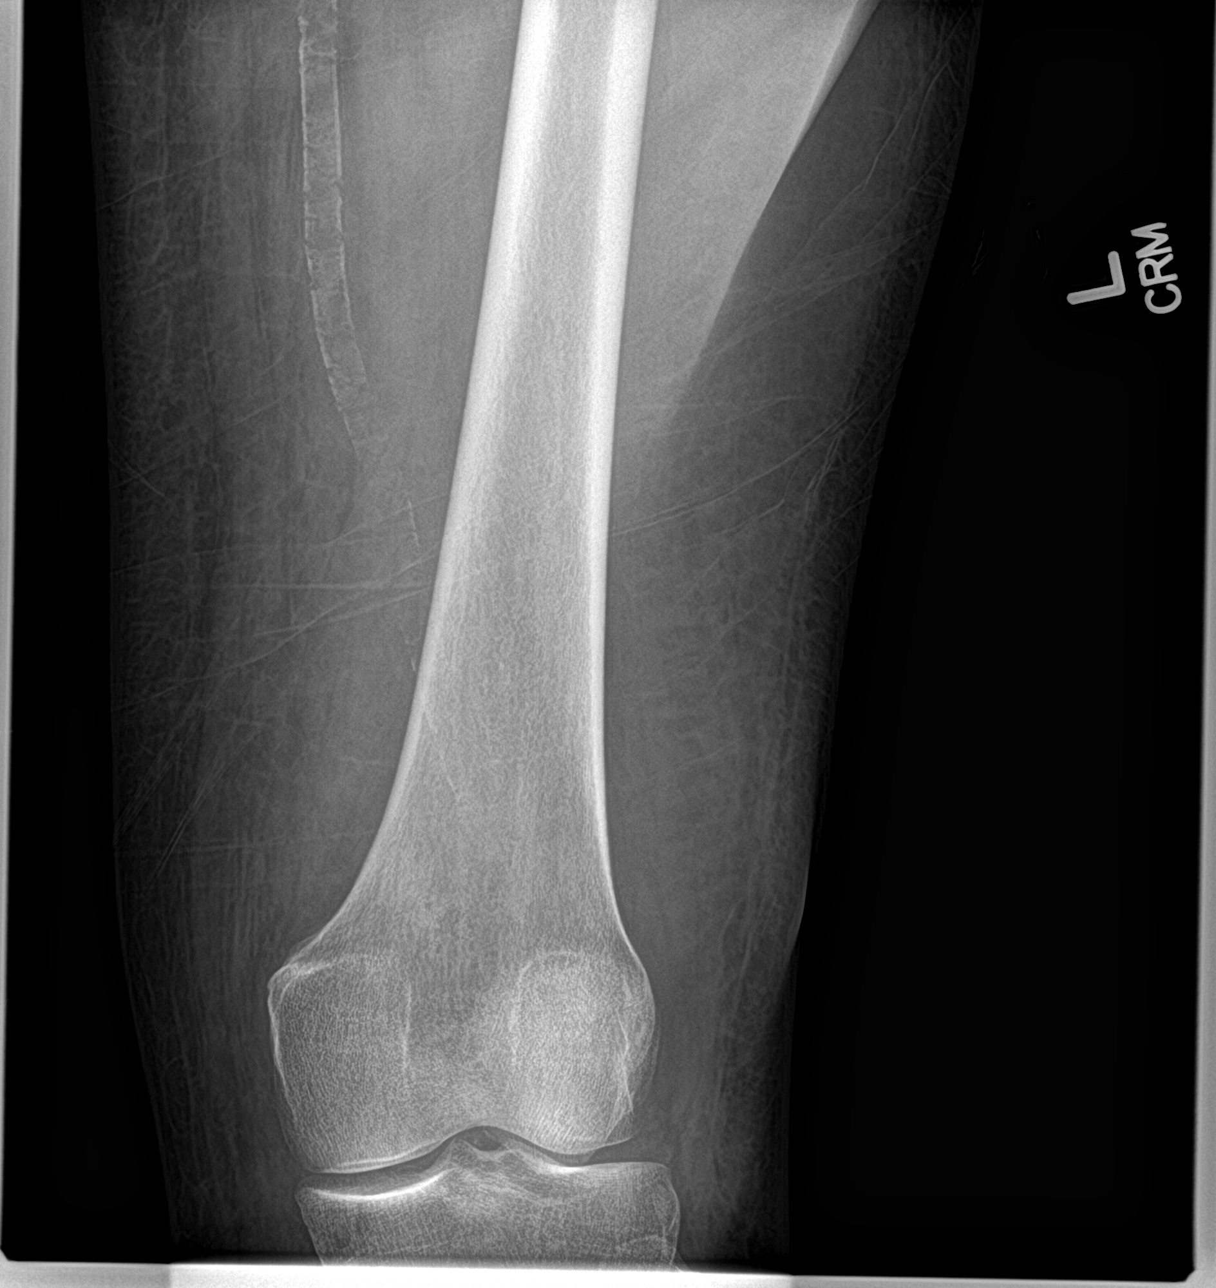

[femur lat (1 of 2)]
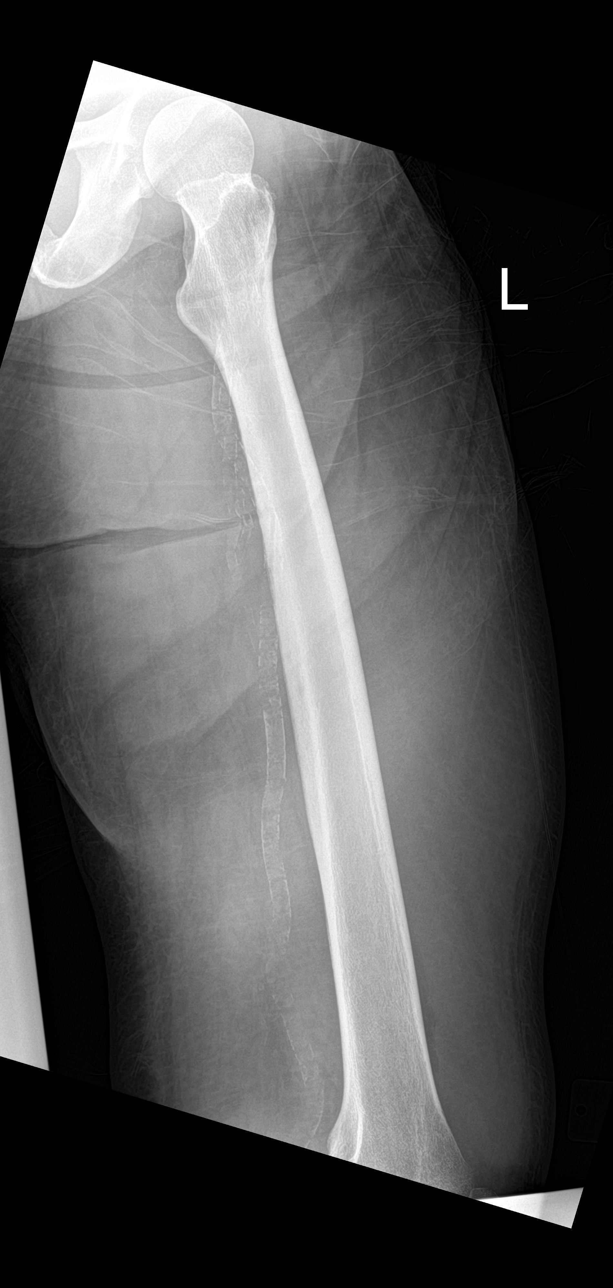

[femur lat (2 of 2)]
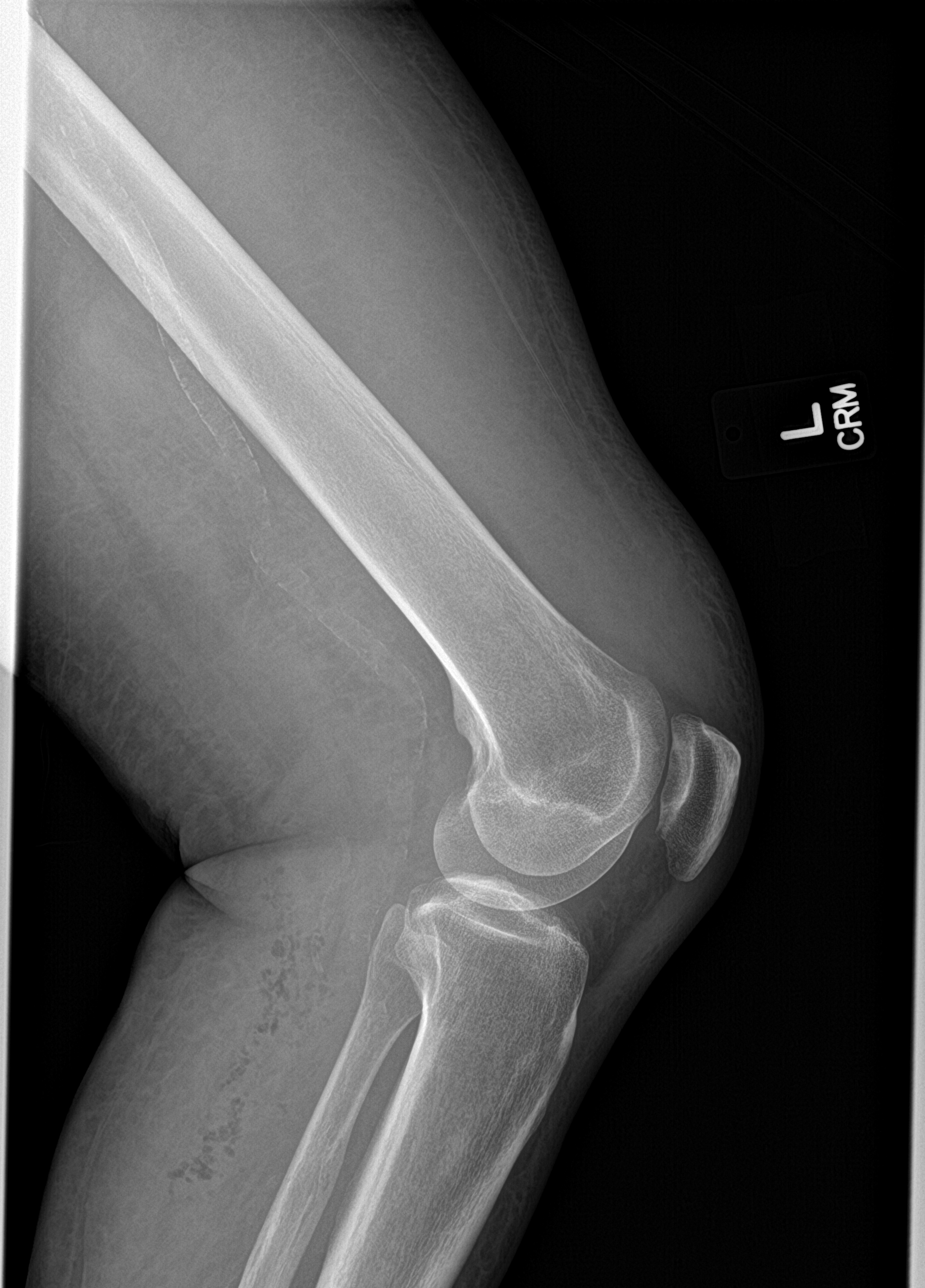

[4 of 4 positions shown; findings below may reference images not displayed]

FINDINGS: Soft tissue swelling in the distal thigh region. No acute bony
abnormality. Specifically, no fracture, subluxation, or dislocation.
Soft tissues are intact. No visible joint effusion within the left
knee. Joint spaces are maintained. Vascular calcifications noted.
IMPRESSION: No acute bony abnormality.

## 2018-06-06 IMAGING — CR DG CHEST 2V
2 series · 2 of 2 positions shown · non-contrast
Comparison: Two-view chest x-ray 01/20/2017

CLINICAL DATA: Weakness. Left lower extremity pain. Left lower
extremity abscess.

EXAM:
CHEST  2 VIEW

[chest pa]
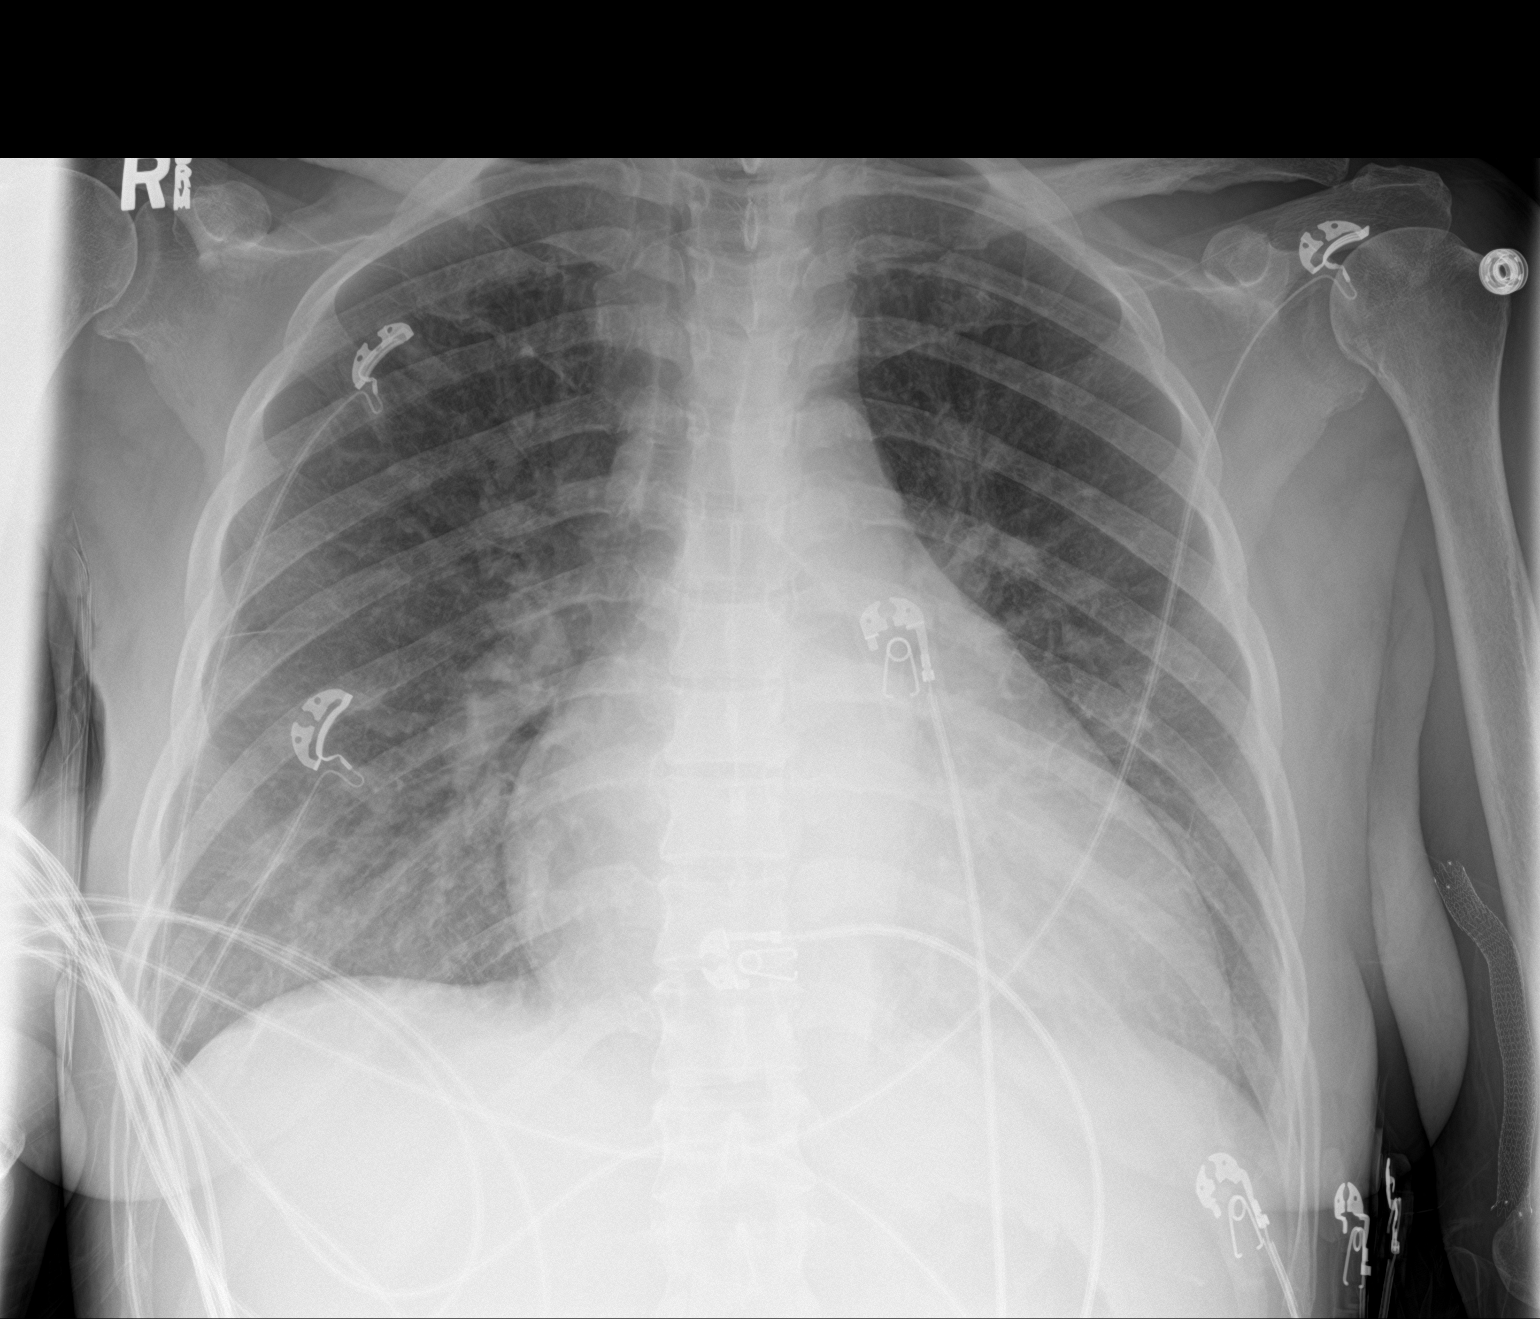

[chest lat]
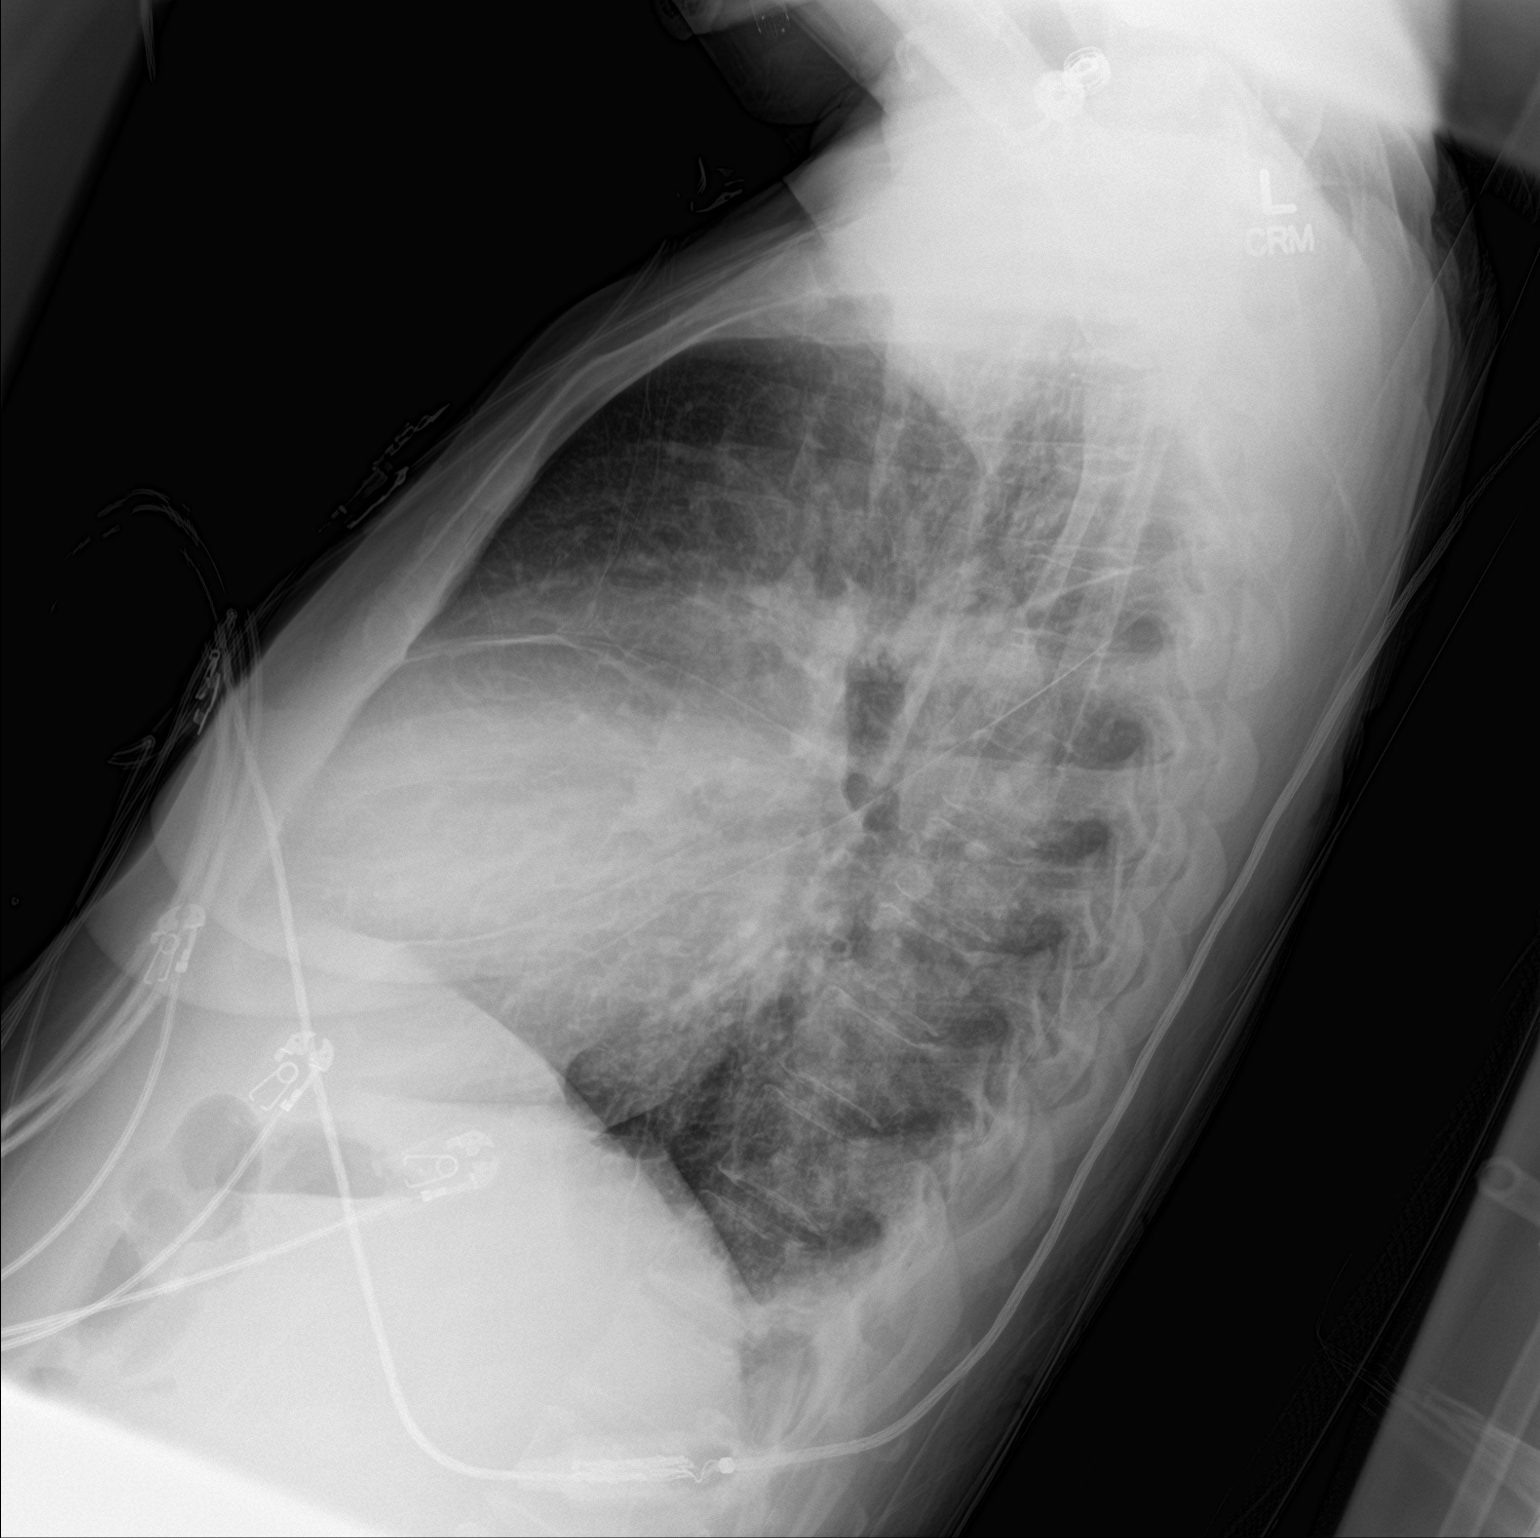

[2 of 2 positions shown; findings below may reference images not displayed]

FINDINGS: Heart is enlarged. Mild edema is now present. Small bilateral
pleural effusions are present. Minimal bibasilar airspace disease
likely reflects atelectasis. The upper lung fields are clear. The
visualized soft tissues and bony thorax are unremarkable.
IMPRESSION: 1. Cardiomegaly and mild edema compatible with congestive heart
failure.
2. Minimal bibasilar airspace disease likely reflects atelectasis.

## 2018-06-06 IMAGING — CR DG ELBOW COMPLETE 3+V*R*
4 series · 4 of 4 positions shown · non-contrast
Comparison: None.

CLINICAL DATA: 50-year-old female with pain and swelling right
elbow. Injury from fall 2 years ago untreated. Initial encounter.

EXAM:
RIGHT ELBOW - COMPLETE 3+ VIEW

[elbow ap]
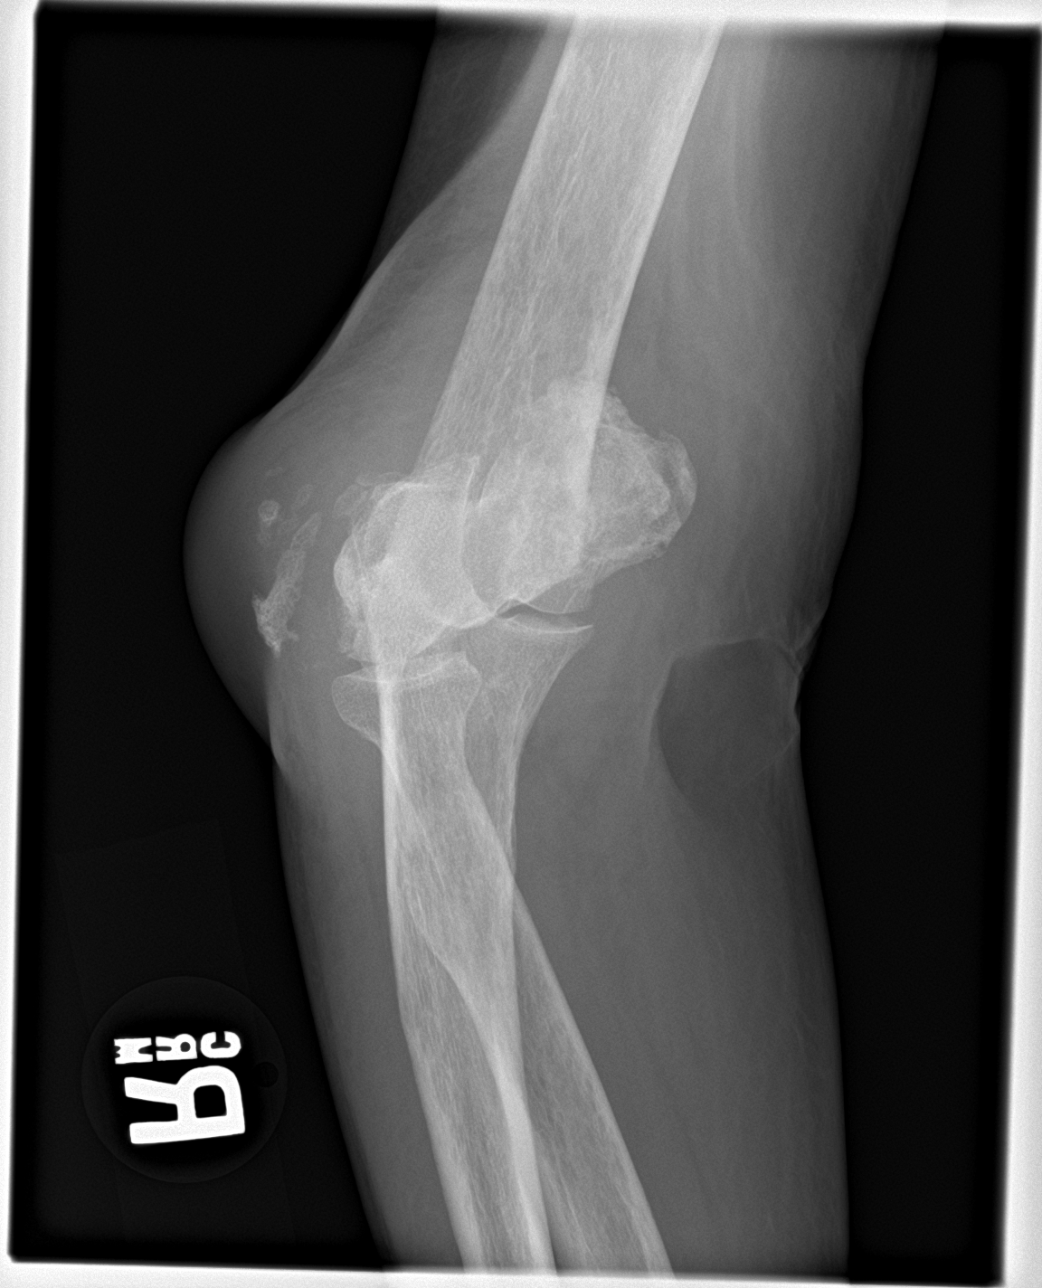

[elbow obl (1 of 2)]
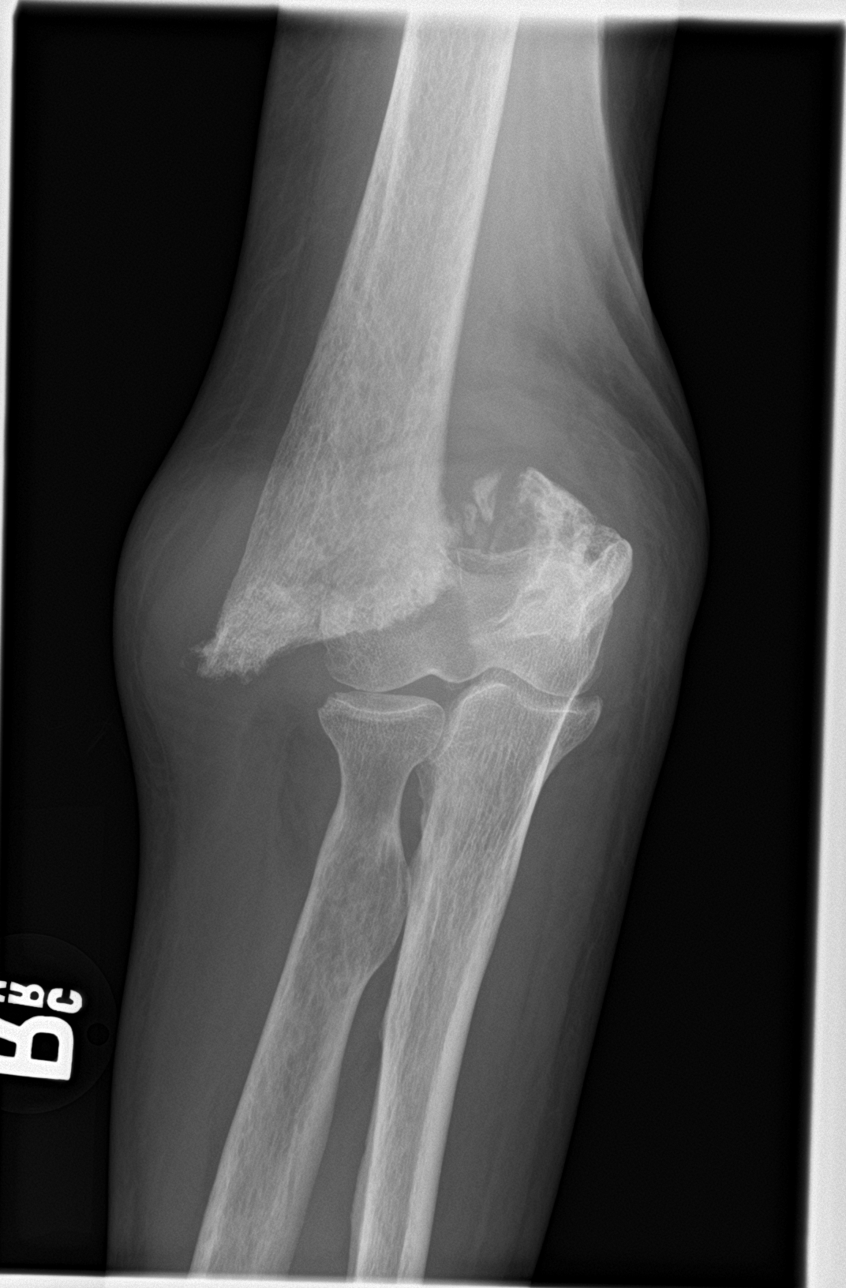

[elbow obl (2 of 2)]
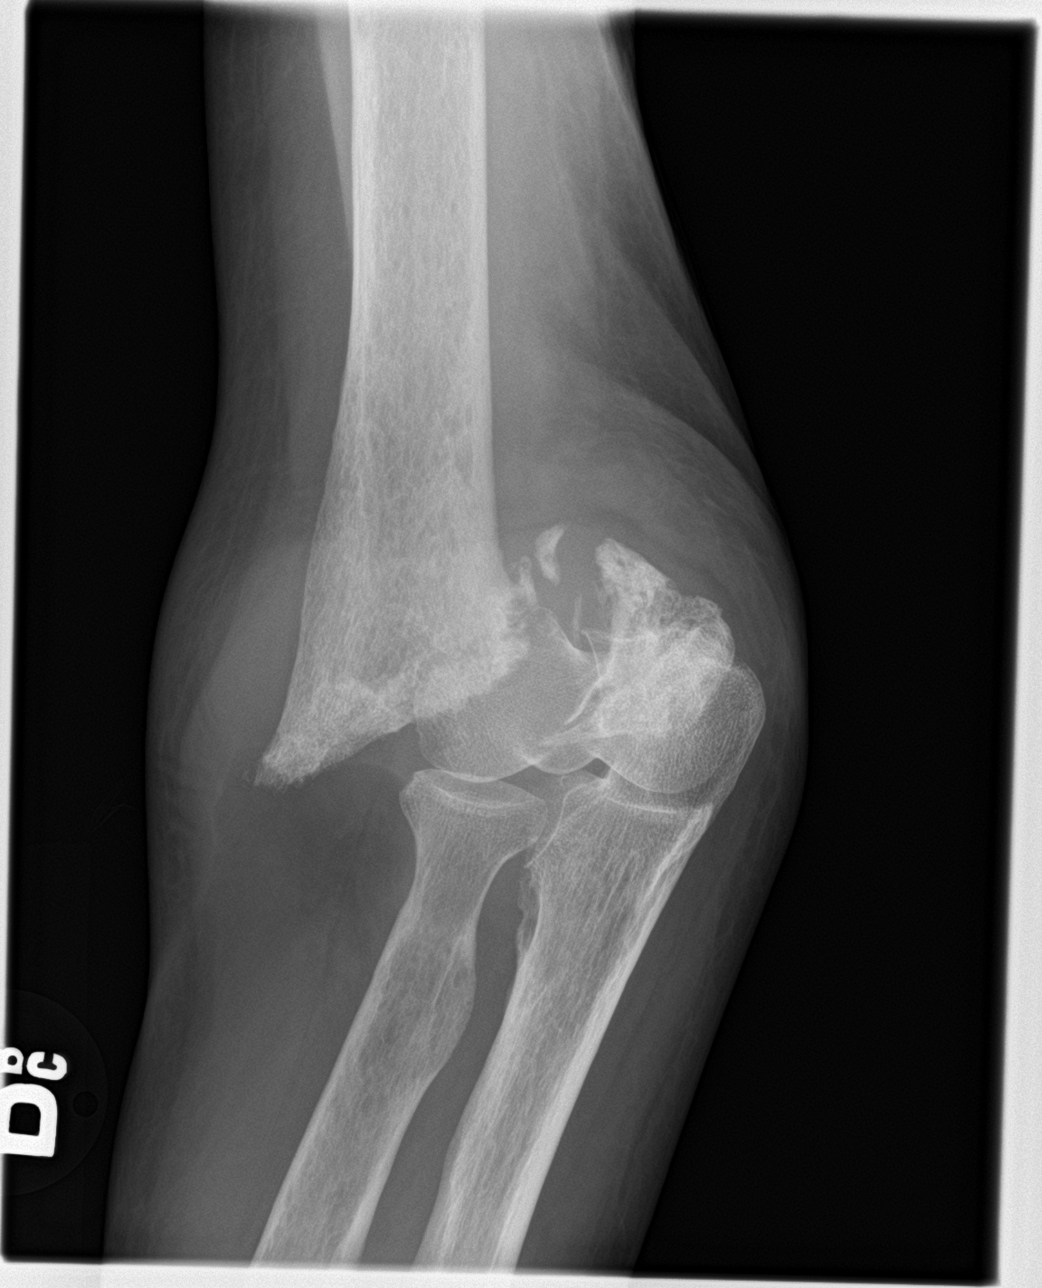

[elbow lat]
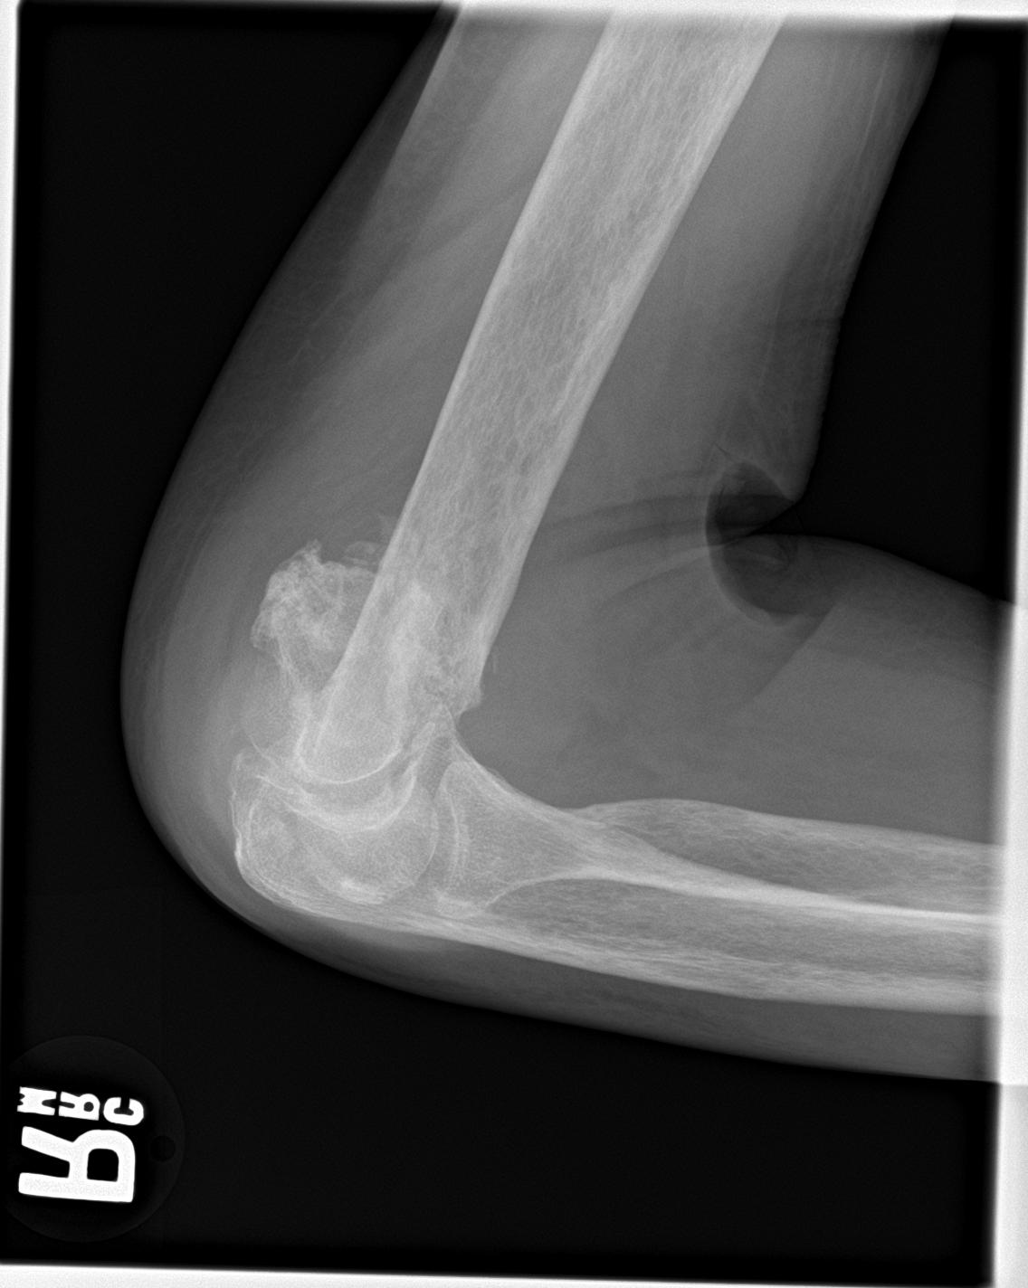

[4 of 4 positions shown; findings below may reference images not displayed]

FINDINGS: Nonunion of fracture of the distal right humeral metaphysis with
fragmentation and displacement of fracture fragments. Surrounding
soft tissue swelling which may indicate effusion although infection
or hematoma not excluded.

Radial head slightly irregular and remains aligned with the
capitellum fracture fragment.

Olecranon trochlea groove widening and aligned with the trochlea
fracture fragment.

Question soft tissue defect anterior elbow joint.
IMPRESSION: Nonunion of fracture of the distal right humeral metaphysis with
fragmentation and displacement of fracture fragments. Surrounding
soft tissue swelling which may indicate effusion although infection
or hematoma not excluded.

Radial head slightly irregular and remains aligned with the
capitellum fracture fragment.

Olecranon trochlea groove widening and aligned with the trochlea
fracture fragment.

## 2018-06-08 IMAGING — MR MR FEMUR*L* W/O CM
4 of 12 series · 17 of 40 positions shown · non-contrast
Comparison: 01/01/2017, 01/16/2017 MRI left femur

Ultrasound left lower extremity 02/24/2017

CLINICAL DATA: 50 y.o. female with medical history significant of
hepatitis C, hypertension, dyslipidemia, ESRD on hemodialysis,
chronic combined systolic and diastolic congestive heart failure,
bipolar disorder, anxiety/depression, type 2 diabetes.
TECHNIQUE: Multiplanar, multisequence MR imaging of the left femur and left
lower leg was performed. No intravenous contrast was administered.

[Series 4: T1 · coronal · 7.0mm · 0.92mm/px · 3 of 22 slices shown (1 of 2)]
[im 1/22]
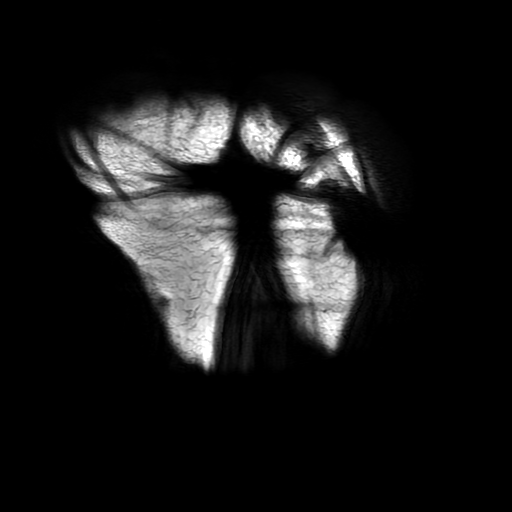
[im 11/22]
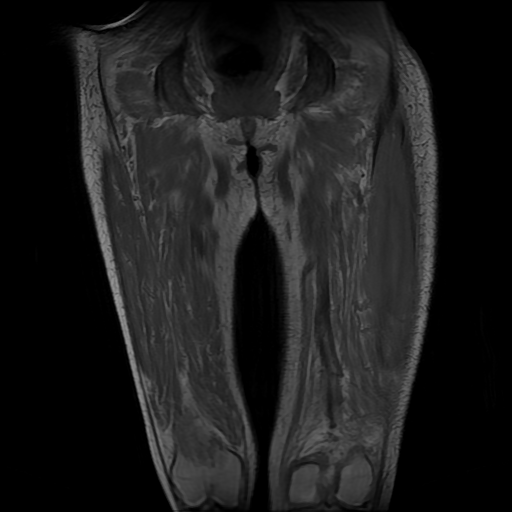
[im 22/22]
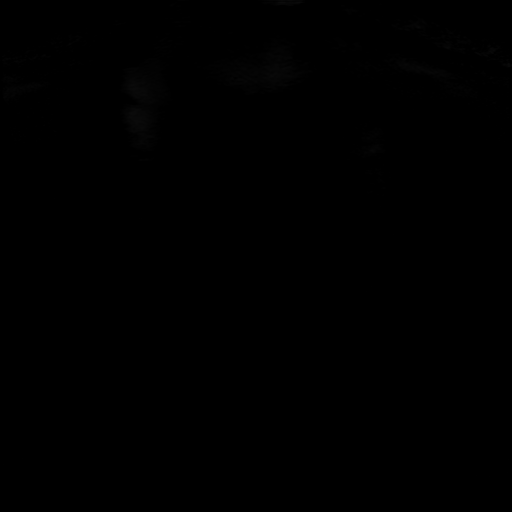

[Series 6: T1 · axial · 8.0mm · 0.74mm/px · z∈[-311,+144]mm · 4 of 47 slices shown (2 of 2)]
[im 1/47]
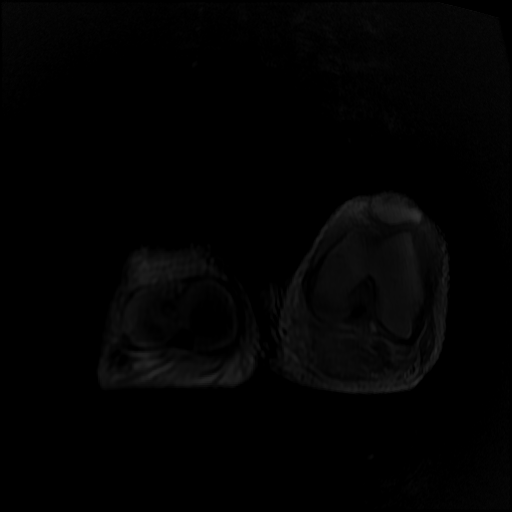
[im 12/47]
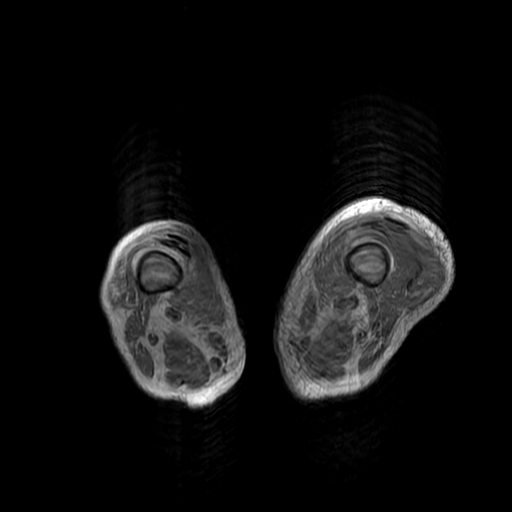
[im 24/47]
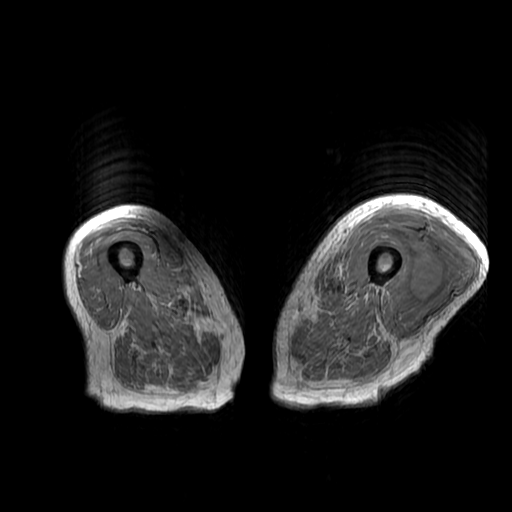
[im 47/47]
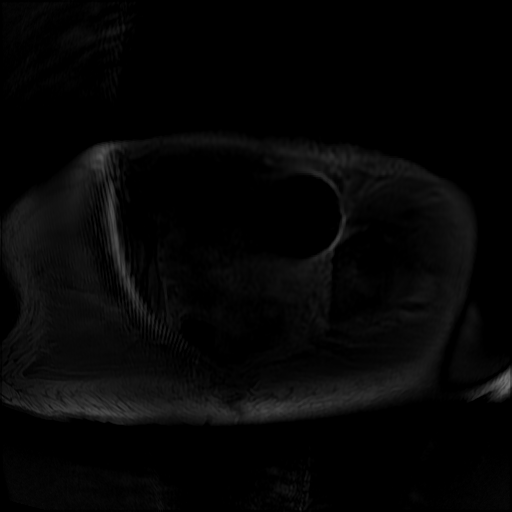

[Series 7: T2 fat-sat · axial · 8.0mm · 0.74mm/px · z∈[-311,+144]mm · 5 of 47 slices shown (1 of 2)]
[im 1/47]
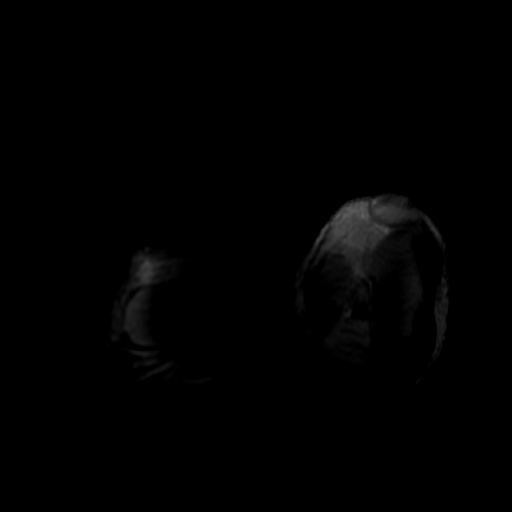
[im 12/47]
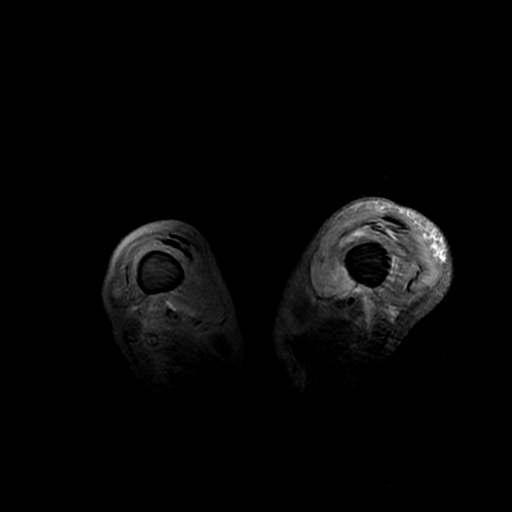
[im 24/47]
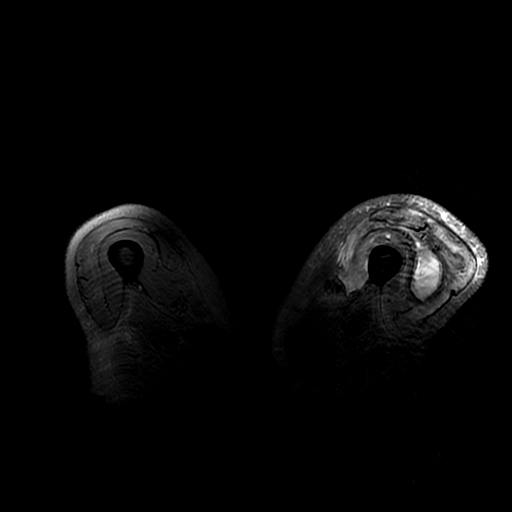
[im 35/47]
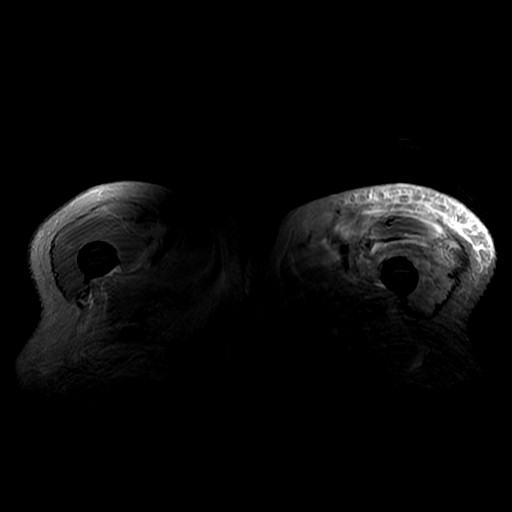
[im 47/47]
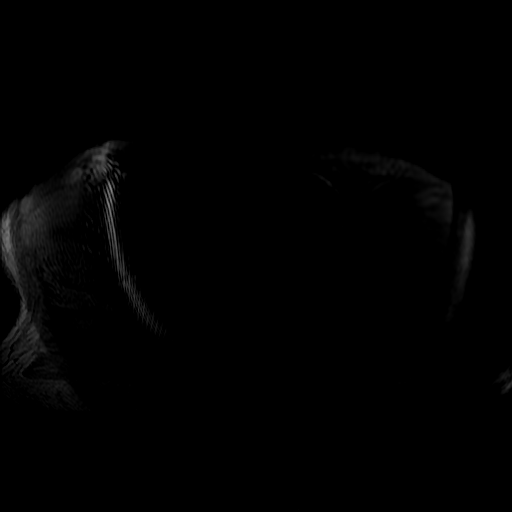

[Series 14: T2 fat-sat · axial · 7.0mm · 0.68mm/px · z∈[-195,+221]mm · 5 of 45 slices shown (2 of 2)]
[im 1/45]
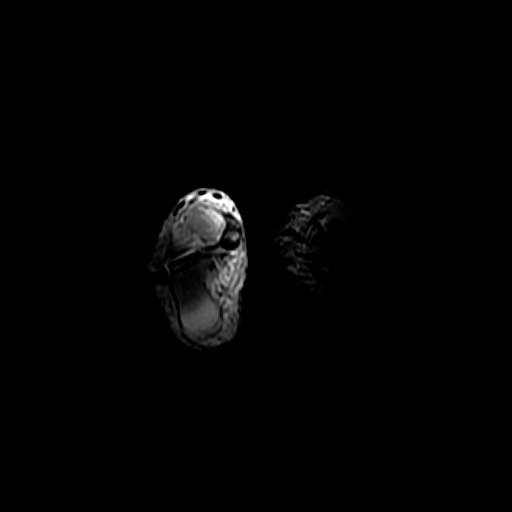
[im 12/45]
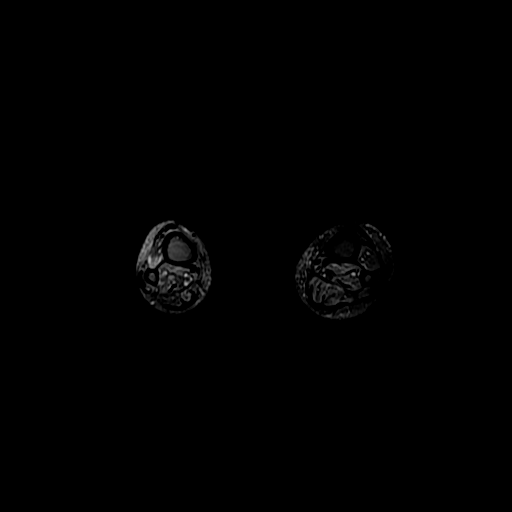
[im 23/45]
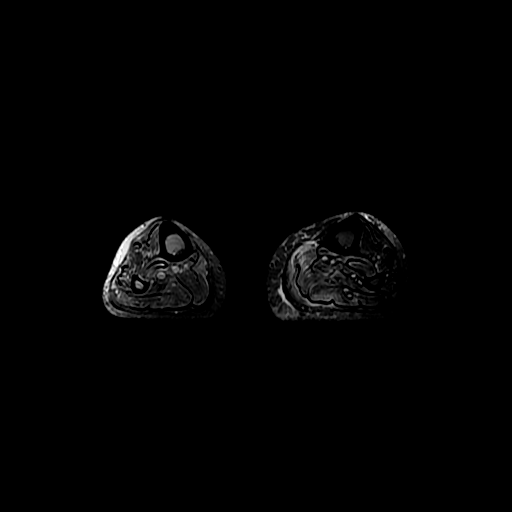
[im 34/45]
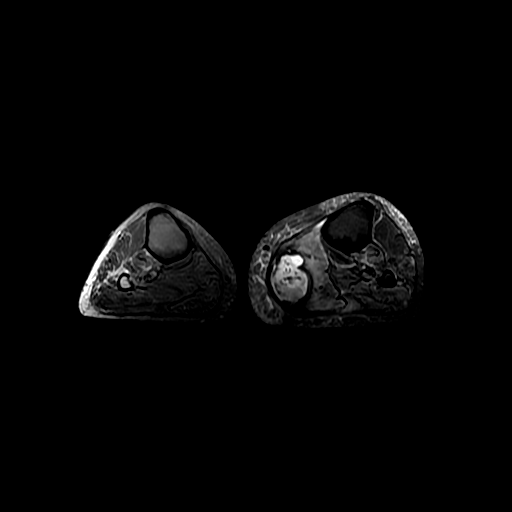
[im 45/45]
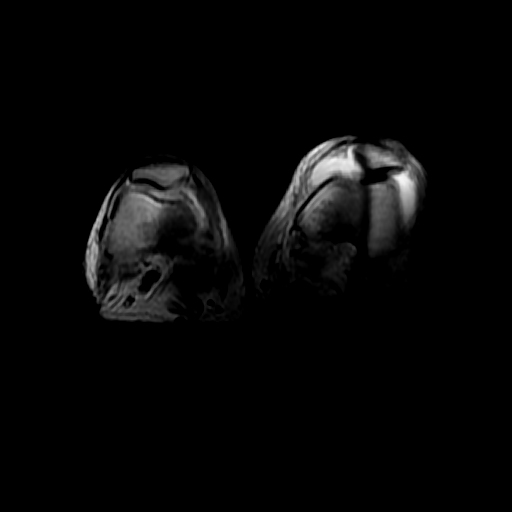

[17 of 40 positions shown; findings below may reference images not displayed]

Patient presents with worsening left lower extremities pain
associated with swelling. Patient was admitted about a month ago for
similar problem when you she was found to have left eye phlegmon and
had aspiration done. The cultures was negative. She was treated with
antibiotics. At that time she was seen by gout when the antibiotics
was discontinued and treated with colchicine and allopurinol.
Patient reported that she went home but did not get better. Now she
reported that the left eye is swollen associated with worsened pain
in her left calf for about a week. Patient went to outside facility
yesterday and had ultrasound of left lower extremities done when
there is no DVT noticed however found to have mixed echogenic fluid
similar to prior MRI imaging found.

EXAM:
MRI OF LOWER LEFT EXTREMITY WITHOUT CONTRAST; MR OF THE LEFT FEMUR
WITHOUT CONTRAST
FINDINGS: Bones/Joint/Cartilage

No marrow signal abnormality. No fracture or dislocation. Normal
alignment. Large left knee joint effusion.

Ligaments

Collateral ligaments are intact.  ACL and PCL are intact.

Muscles and Tendons
Abnormal muscle edema in the extensor compartment musculature of the
left thigh. 2.5 x 4 x 11.2 cm complex fluid collection which is T1
intermediate signal and mildly T2 hyperintense located between the
vastus intermedius and vastus lateralis muscles of the mid thigh
most concerning for a a hematoma versus infection. High-grade
partial-thickness tear of the vastus lateralis muscle at the level
of the mid thigh (image 28/series 7) with a 2.7 x 1.8 cm complex
fluid collection at the site of the tear consistent with a small
hematoma which appears to communicate with the larger collection.

Mild generalized muscle atrophy of the gastrocnemius musculature
bilaterally.

Complex fluid collection within the medial gastrocnemius muscle
measuring approximately 2.7 x 4 x 10.9 cm with low signal foci
within the fluid collection. Areas of T1 hyperintensity within the
fluid collection are noted dependently. The appearance is most
concerning for an intramuscular hematoma with the low signal foci
likely reflecting hemosiderin versus air secondary to an infected
hematoma given the ultrasound findings.

Soft tissue
No fluid collection or hematoma.  No soft tissue mass.
IMPRESSION: 1. Abnormal muscle edema in the extensor compartment musculature of
the left thigh which may reflect muscle strain versus myositis.
x 4 x 11.2 cm complex fluid collection located between the vastus
intermedius and vastus lateralis muscles of the mid thigh most
concerning for a a hematoma versus infection. High-grade
partial-thickness tear of the vastus lateralis muscle at the level
of the mid thigh (image 28/series 7) with a 2.7 x 1.8 cm complex
fluid collection at the site of the tear consistent with a small
hematoma which appears to communicate with the larger collection.
2. Complex fluid collection within the medial gastrocnemius muscle
measuring approximately 2.7 x 4 x 10.9 cm with low signal foci
within the fluid collection and areas of T1 hyperintensity within
the fluid collection are noted dependently. The appearance is most
concerning for an intramuscular hematoma with the low signal foci
likely reflecting hemosiderin versus air secondary to an infected
hematoma given the ultrasound findings. Aspiration and laboratory
analysis may be helpful given these findings.

## 2018-06-29 ENCOUNTER — Ambulatory Visit: Payer: Medicare Other | Admitting: Neurology

## 2018-07-07 ENCOUNTER — Telehealth: Payer: Self-pay | Admitting: Neurology

## 2018-07-07 NOTE — Telephone Encounter (Signed)
FYI- patient is a new patient and has no-showed twice in 2019

## 2018-07-07 NOTE — Telephone Encounter (Signed)
Since she has two [facility], please do not schedule her again with me.

## 2018-07-08 ENCOUNTER — Encounter: Payer: Self-pay | Admitting: Neurology

## 2018-09-02 ENCOUNTER — Encounter (INDEPENDENT_AMBULATORY_CARE_PROVIDER_SITE_OTHER): Payer: Self-pay | Admitting: Orthopedic Surgery

## 2018-09-02 ENCOUNTER — Ambulatory Visit (INDEPENDENT_AMBULATORY_CARE_PROVIDER_SITE_OTHER): Payer: Medicare Other | Admitting: Orthopedic Surgery

## 2018-09-02 DIAGNOSIS — Z89432 Acquired absence of left foot: Secondary | ICD-10-CM | POA: Diagnosis not present

## 2018-09-02 MED ORDER — MUPIROCIN 2 % EX OINT: 1.0000 "application " | TOPICAL_OINTMENT | Freq: Two times a day (BID) | CUTANEOUS | 6 refills | Status: DC

## 2018-09-02 NOTE — Progress Notes (Signed)
Office Visit Note   Patient: [name]           Date of Birth: July 22, 1966           MRN: [id number] Visit Date: 09/02/2018              Requested by: No referring provider defined for this encounter. PCP: Patient, [name]  No chief complaint on file.     HPI: The patient is a 53 year old woman seen today for initial evaluation of ulceration to left foot. She currently resides at [facility]. Has had ulcer to left foot for several months. Is status post left transmetatarsal amputation. States ulcer began due to her frequent weight bearing. Ambulates with walker in regular shoe wear. Has dry dressing in place today.   Denies fevers or chills. No drainage.  Assessment & Plan: Visit Diagnoses:  1. transmetatarsal amputation of the left foot     Plan: mupirocin dressing changes daily following wound cleansing. Given order for kneeling scooter, is to non weight bear on left foot.   Follow-Up Instructions: Return in about 3 weeks (around 09/23/2018).   Ortho Exam  Patient is alert, oriented, no adenopathy, well-dressed, normal affect, normal respiratory effort. On examination of left foot, is status post transmetatarsal amputation. This is well healed. Has callused ulceration to plantar aspect of foot. Open ulcer beneath first metatarsal head. Is 8 mm in diameter. One satellite lesion that is 2 mm in diameter. There is surrounding maceration. No drainage. Callused ulceration was pared with a 10 blade back to viable tissue. No odor. No sign of infection.   Imaging: No results found. No images are attached to the encounter.  Labs: Lab Results  Component Value Date   HGBA1C 6.1 (H) 03/17/2018   HGBA1C 5.9 (H) 08/08/2017   HGBA1C 8.7 (H) 07/21/2016   ESRSEDRATE 82 (H) 02/25/2017   ESRSEDRATE 100 (H) 01/16/2017   ESRSEDRATE 40 (H) 12/31/2016   CRP <0.8 02/25/2017   CRP 3.5 (H) 01/16/2017   CRP 6.7 (H) 12/31/2016   LABURIC 7.3 (H) 01/21/2017   REPTSTATUS 03/02/2017  FINAL 02/25/2017   GRAMSTAIN  01/18/2017    MODERATE WBC PRESENT, PREDOMINANTLY PMN NO ORGANISMS SEEN    CULT NO GROWTH 5 DAYS 02/25/2017     Lab Results  Component Value Date   ALBUMIN 3.0 (L) 03/19/2018   ALBUMIN 3.0 (L) 03/18/2018   ALBUMIN 3.4 (L) 03/17/2018   LABURIC 7.3 (H) 01/21/2017    There is no height or weight on file to calculate BMI.  Orders:  No orders of the defined types were placed in this encounter.  Meds ordered this encounter  Medications  . mupirocin ointment (BACTROBAN) 2 %    Sig: Apply 1 application topically 2 (two) times daily.    Dispense:  22 g    Refill:  6     Procedures: No procedures performed  Clinical Data: No additional findings.  ROS:  All other systems negative, except as noted in the HPI. Review of Systems  Constitutional: Negative for chills and fever.  Cardiovascular: Negative for leg swelling.  Skin: Positive for wound. Negative for color change.    Objective: Vital Signs: LMP  (LMP Unknown)   Specialty Comments:  No specialty comments available.  PMFS History: Patient Active Problem List   Diagnosis Date Noted  . Localized edema due to fluid overload 09/05/2017  . Lower GI bleed   . Evaluation by psychiatric service required 07/17/2017  . Volume  overload 06/03/2017  . Right supracondylar humerus fracture, with nonunion, subsequent encounter 05/01/2017  . Malnutrition of moderate degree 02/28/2017  . Leg pain, left 02/25/2017  . Left leg pain 02/25/2017  . External hemorrhoids   . Diverticulosis of colon without hemorrhage   . Goals of care, counseling/discussion   . Palliative care encounter   . Cellulitis of left leg 01/16/2017  . Diabetes mellitus with complication ([place]) 94/76/5465  . Upper GI bleed 01/16/2017  . UGIB (upper gastrointestinal bleed)   . Anemia due to chronic kidney disease   . Cellulitis of left thigh 01/01/2017  . ESRD on dialysis ([place]) 01/01/2017  . Cocaine use disorder, severe,  dependence ([place]) 08/29/2016  . Hepatitis C 08/29/2016  . Diabetic ulcer of calf ([place]) 08/29/2016  . Diabetic ulcer of toe ([place]) 08/29/2016  . Cerebrovascular accident (CVA) ([place]) 08/29/2016  . MDD (major depressive disorder) 08/28/2016  . Dyslipidemia associated with type 2 diabetes mellitus ([place]) 11/10/2015  . Hyperkalemia 09/17/2015  . History of vitamin D deficiency 06/20/2015  . Long term prescription opiate use 06/20/2015  . Chronic pain syndrome 06/19/2015  . Anemia of chronic kidney failure 05/01/2015  . transmetatarsal amputation of the left foot 12/13/2014  . Unilateral visual loss 11/22/2014  . 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 ([place]) 11/22/2014  . Essential hypertension 11/09/2014  . Dizziness   . Chronic combined systolic (congestive) and diastolic (congestive) heart failure ([place])   . Rectal bleeding 04/23/2014  . Anemia, iron deficiency 04/13/2014  . Noncompliance 02/02/2014  . Hyponatremia 05/19/2013  . Seizure disorder ([place]) 02/09/2013   Past Medical History:  Diagnosis Date  . Anemia   . Anxiety   . Bipolar disorder, unspecified ([place]) 02/06/2014  . Chronic combined systolic and diastolic CHF (congestive heart failure) ([facility])    EF 40% by echo and 48% by stress test  . Chronic pain syndrome   . Depression   . ESRD on hemodialysis ([place])   . GERD (gastroesophageal reflux disease)   . Glaucoma   . Headache    sinus headaches  . Hepatitis C   . High cholesterol   . History of hiatal hernia   . History of vitamin D deficiency 06/20/2015  . Hypertension   . MRSA infection ?2014   "on the back of my head; spread throughout my bloodstream"  . Neuropathy   . Refusal of blood transfusions as patient is Jehovah's Witness   . Schizoaffective disorder, unspecified condition 02/08/2014  . Seizure [facility]) dx'd 2014   "don't know what kind; last one was ~ 04/2014"  . Stroke [facility])    TIA 2009  . TIA (transient ischemic attack) 2010  . Type II diabetes mellitus  ([facility])    INSULIN DEPENDENT    Family History  Problem Relation Age of Onset  . Hypertension Mother   . Diabetes Mother   . Hypertension Father   . Diabetes Father     Past Surgical History:  Procedure Laterality Date  . ABDOMINAL HYSTERECTOMY  2014  . ABDOMINAL HYSTERECTOMY  2010  . AMPUTATION Left 05/21/2013   Procedure: Left Midfoot AMPUTATION;  Surgeon: Newt Minion, MD;  Location: [place];  Service: Orthopedics;  Laterality: Left;  Left Midfoot Amputation  . AV FISTULA PLACEMENT Left 05/04/2015   Procedure: ARTERIOVENOUS (AV) GRAFT INSERTION;  Surgeon: Angelia Mould, MD;  Location: [place];  Service: Vascular;  Laterality: Left;  . COLONOSCOPY WITH PROPOFOL N/A 06/22/2013   Procedure: COLONOSCOPY WITH PROPOFOL;  Surgeon: Altamese Dilling  Drema Balzarine, MD;  Location: [facility] ENDOSCOPY;  Service: Endoscopy;  Laterality: N/A;  . ESOPHAGOGASTRODUODENOSCOPY N/A 05/11/2015   Procedure: ESOPHAGOGASTRODUODENOSCOPY (EGD);  Surgeon: Laurence Spates, MD;  Location: [facility] ENDOSCOPY;  Service: Endoscopy;  Laterality: N/A;  . EYE SURGERY Bilateral 2010   Lasik  . FLEXIBLE SIGMOIDOSCOPY N/A 01/17/2017   Procedure: FLEXIBLE SIGMOIDOSCOPY w/ hemorrhoid banding possible;  Surgeon: Gatha Mayer, MD;  Location: [facility] ENDOSCOPY;  Service: Endoscopy;  Laterality: N/A;  . FOOT AMPUTATION Left    due diabetic neuropathy, could not feel ulcer on bottom of foot  . REFRACTIVE SURGERY Bilateral 2013  . TONSILLECTOMY  1970's  . TUBAL LIGATION  2000   Social History   Occupational History  . Occupation: disabled  Tobacco Use  . Smoking status: Current Some Day Smoker    Packs/day: 0.10    Years: 30.00    Pack years: 3.00    Types: Cigarettes    Last attempt to quit: 03/31/2015    Years since quitting: 3.4  . Smokeless tobacco: Never Used  Substance and Sexual Activity  . Alcohol use: Yes    Alcohol/week: 3.0 standard drinks    Types: 3 Standard drinks or equivalent per week    Frequency: Never    Comment: last week    . Drug use: Yes    Types: "Crack" cocaine, Cocaine    Comment: LAST USE YESTERDAY  . Sexual activity: Not Currently    Birth control/protection: Abstinence

## 2018-09-12 IMAGING — CR DG CHEST 2V
2 series · 2 of 2 positions shown · non-contrast
Comparison: 02/25/2017 .

CLINICAL DATA: Chest pain.  Shortness of breath.  Arm and leg pain.

EXAM:
CHEST  2 VIEW

[chest ap]
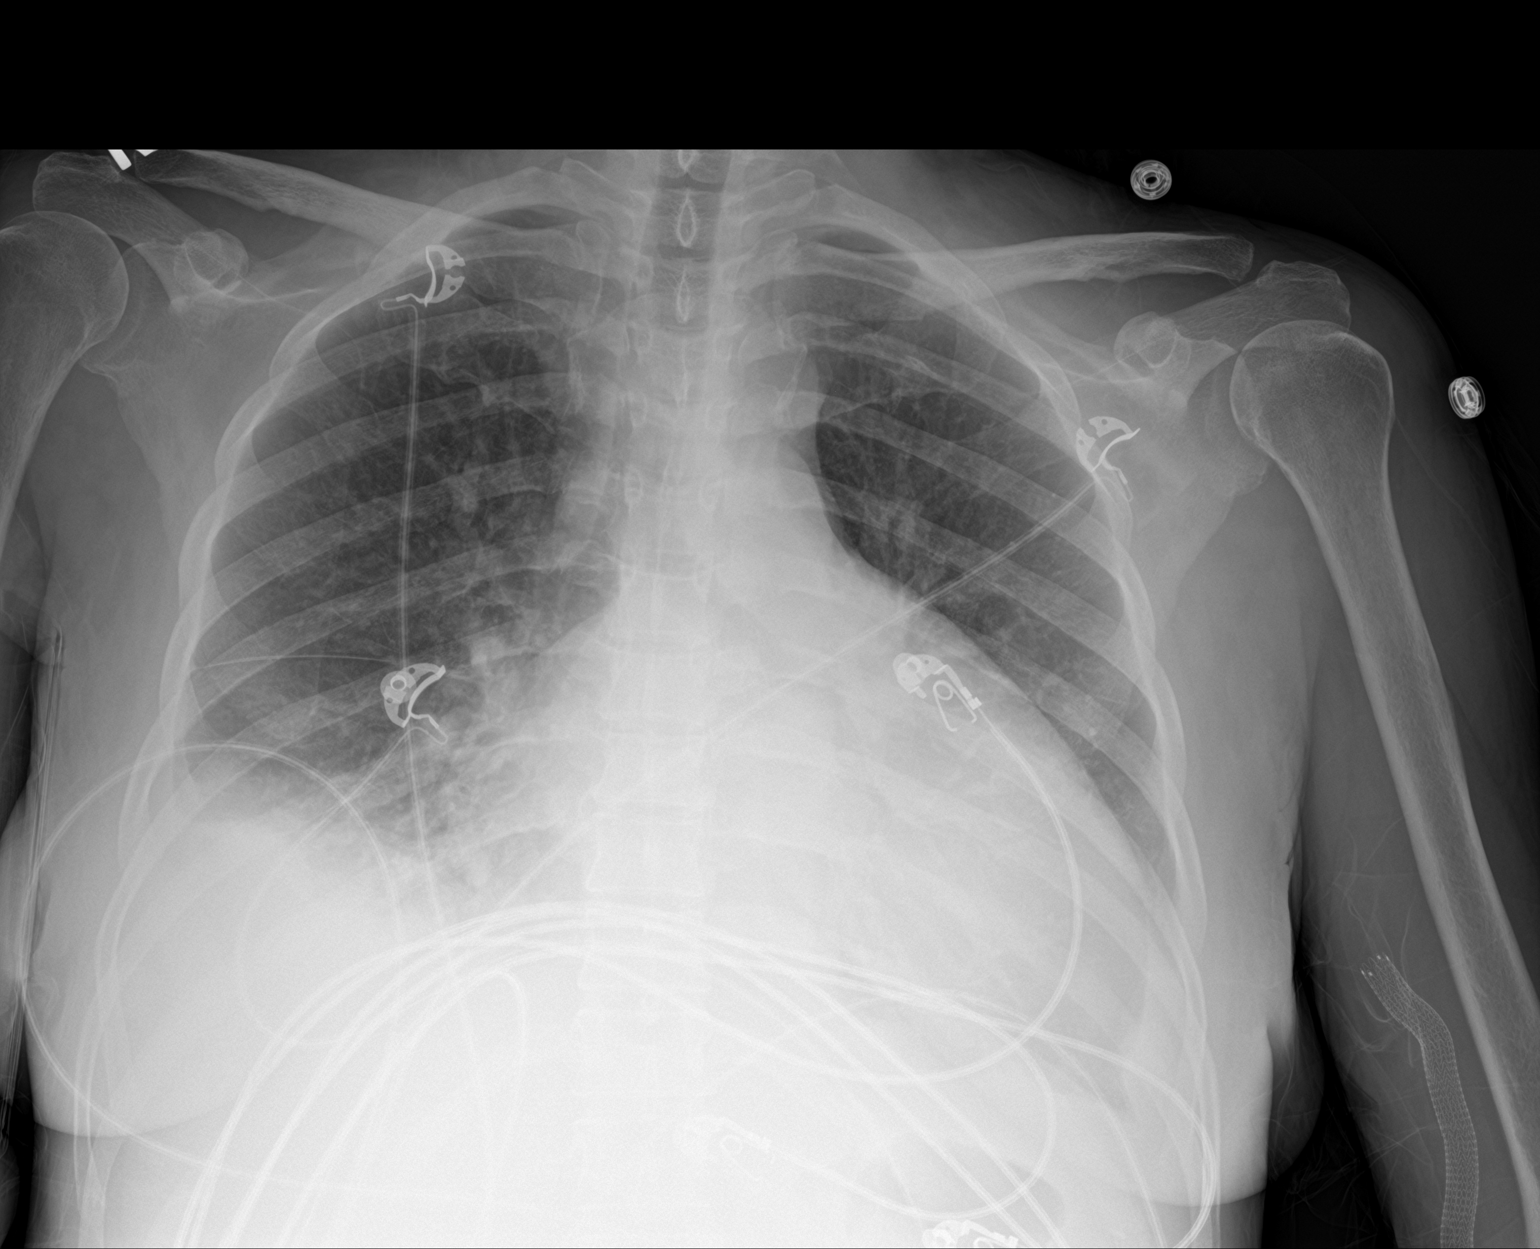

[chest lat]
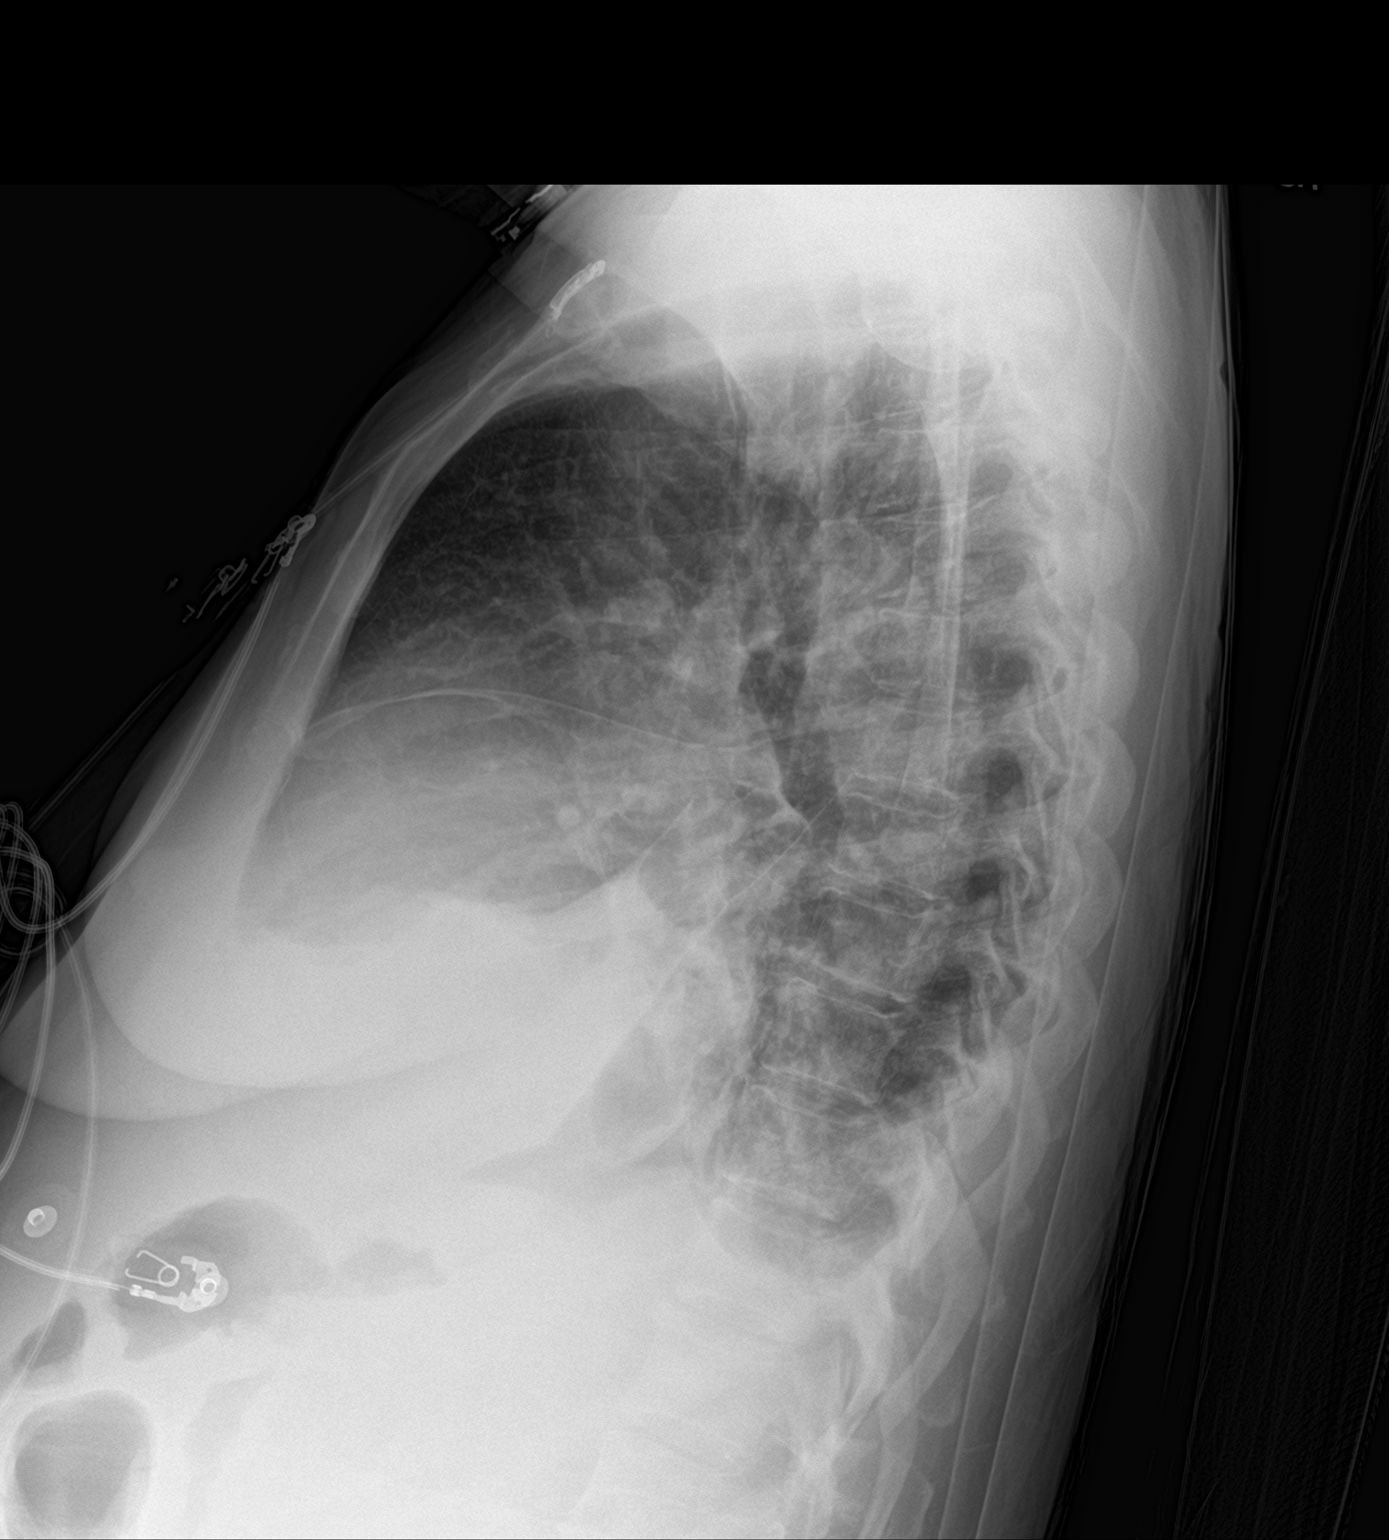

[2 of 2 positions shown; findings below may reference images not displayed]

FINDINGS: Mediastinum and hilar structures are normal . Cardiomegaly with mild
pulmonary vascular prominence, interstitial interstitial prominence,
and bilateral pleural effusions. Findings suggest CHF. Findings have
progressed prior exam inter consistent CHF. Right base
infiltrate/pneumonia cannot be excluded .
IMPRESSION: Progressive changes of congestive heart failure with pulmonary
interstitial edema and bilateral pleural effusions.

2. Right lower lobe infiltrate/pneumonia cannot be excluded.

## 2018-09-12 IMAGING — CT CT HEAD W/O CM
4 series · 17 of 47 positions shown, 19 images · non-contrast
Comparison: Head CT 08/15/2016

CLINICAL DATA: Dizziness and right-sided visual disturbance

EXAM:
CT HEAD WITHOUT CONTRAST
TECHNIQUE: Contiguous axial images were obtained from the base of the skull
through the vertex without intravenous contrast.

[Series 3: head without · axial · non-contrast · 0.44mm/px · z∈[-73,+47]mm · 7 of 34 slices shown, 9 images]
[im 5/34  brain]
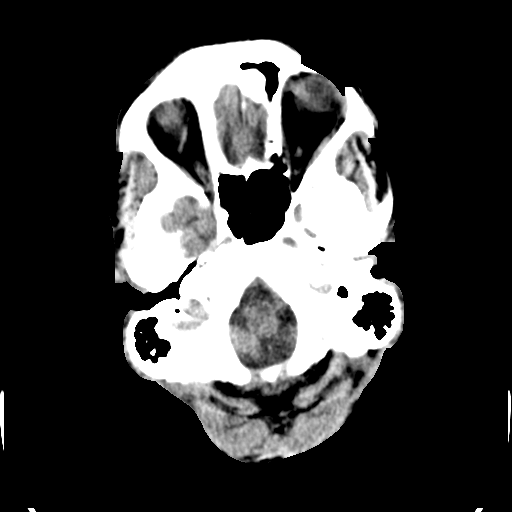
[im 5/34  bone]
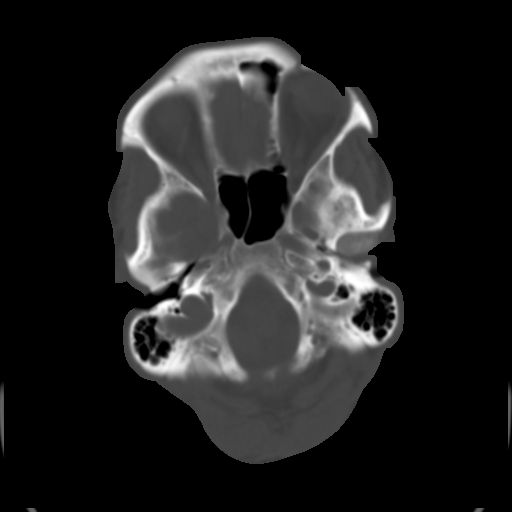
[im 9/34  brain]
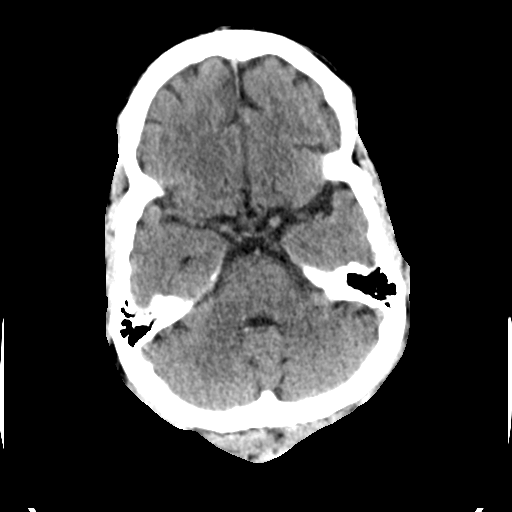
[im 13/34  brain]
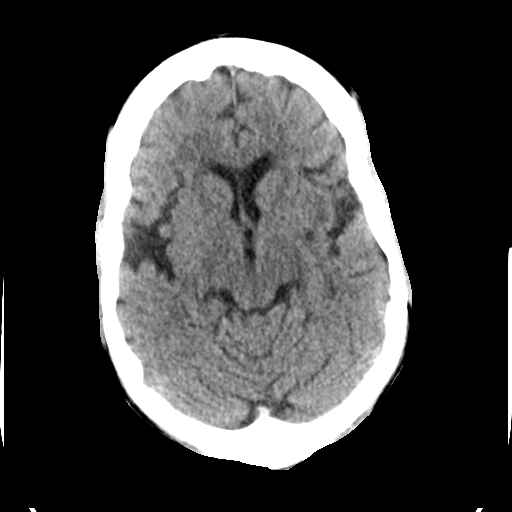
[im 17/34  brain]
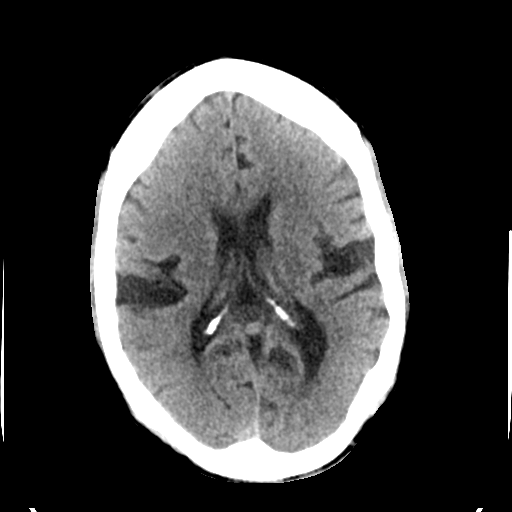
[im 21/34  brain]
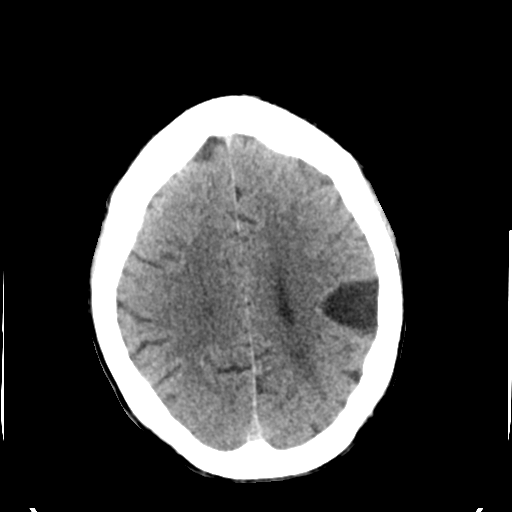
[im 21/34  bone]
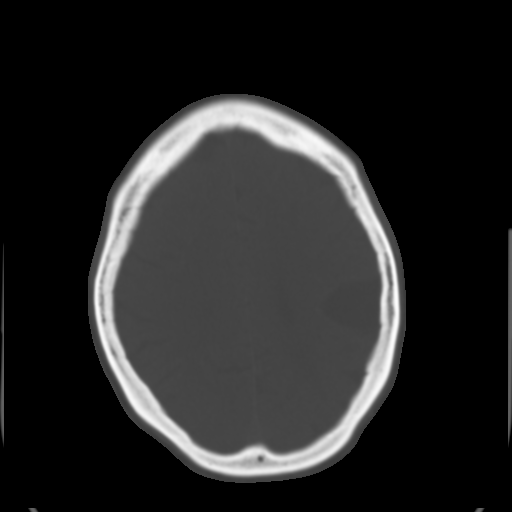
[im 25/34  brain]
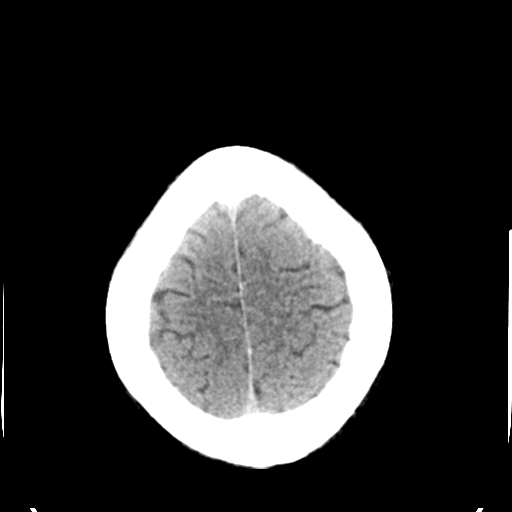
[im 29/34  brain]
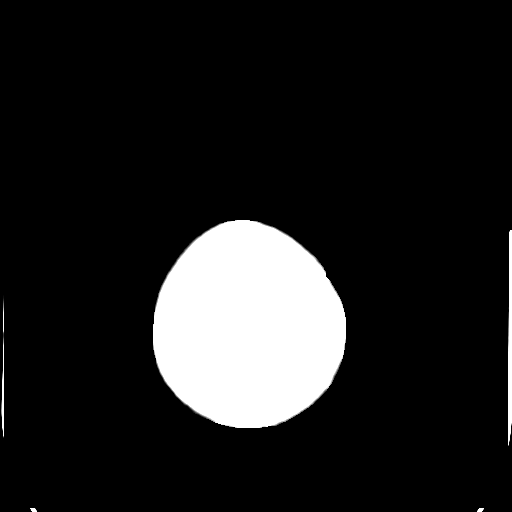

[Series 4: head bone · axial · 0.44mm/px · z∈[-77,-21]mm · 4 of 83 slices shown]
[im 9/83  bone]
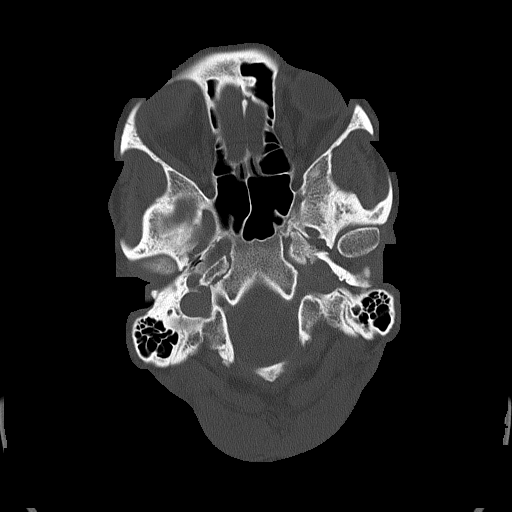
[im 17/83  bone]
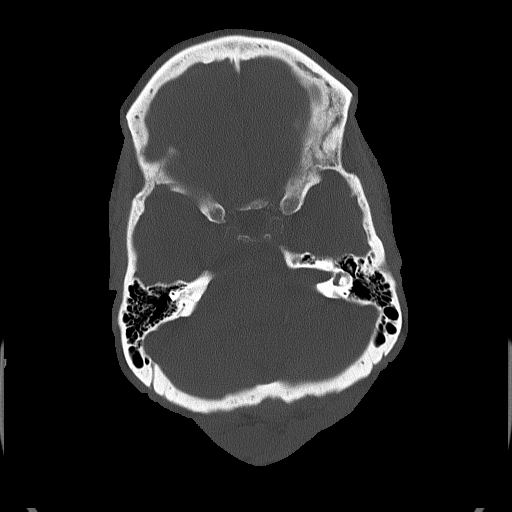
[im 25/83  bone]
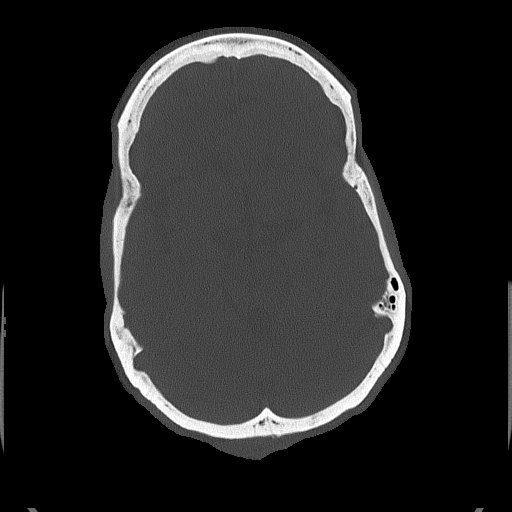
[im 37/83  bone]
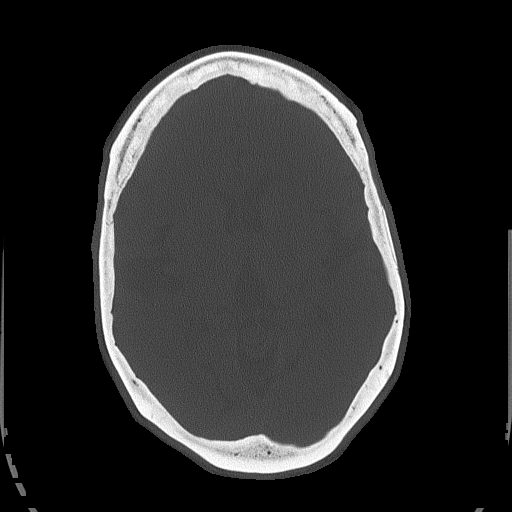

[Series 5: head without cor · coronal · non-contrast · 0.33mm/px · 3 of 75 slices shown]
[im 25/75  brain]
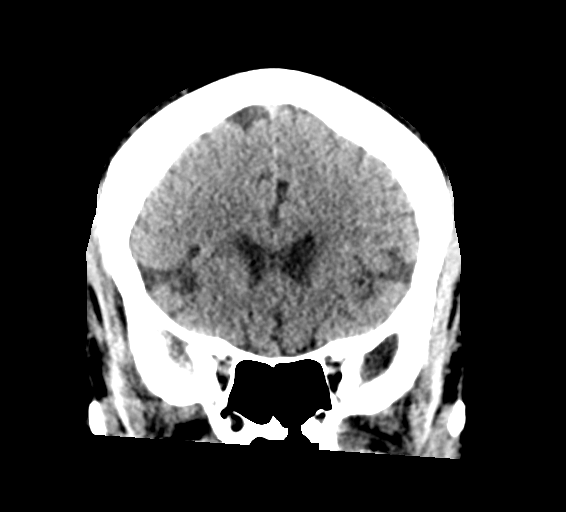
[im 33/75  brain]
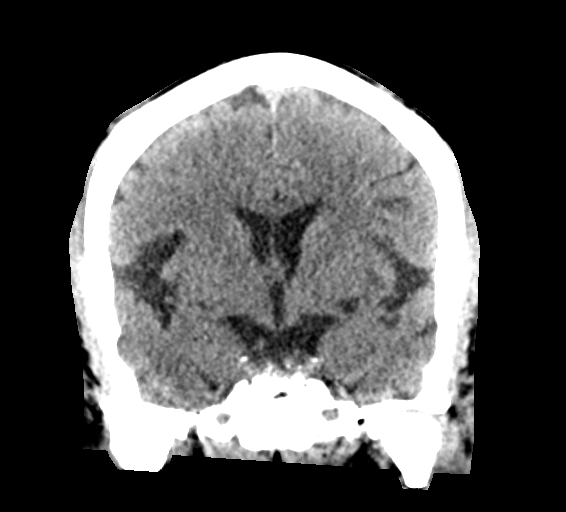
[im 42/75  brain]
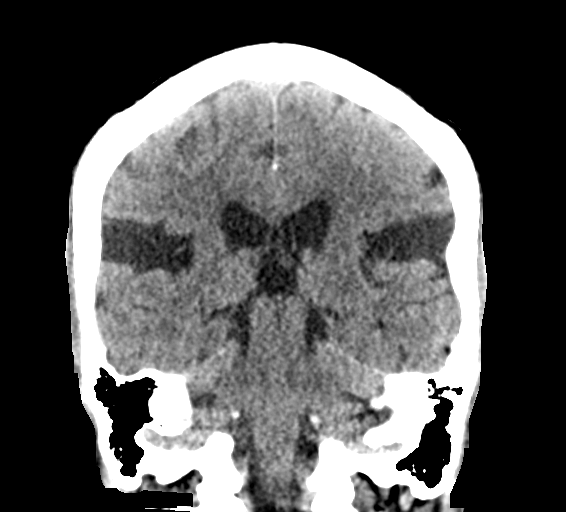

[Series 6: head without sag · sagittal · non-contrast · 0.36mm/px · 3 of 58 slices shown]
[im 20/58  brain]
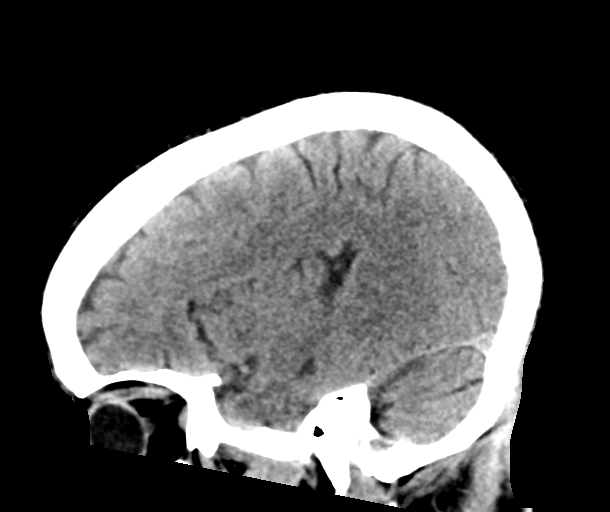
[im 29/58  brain]
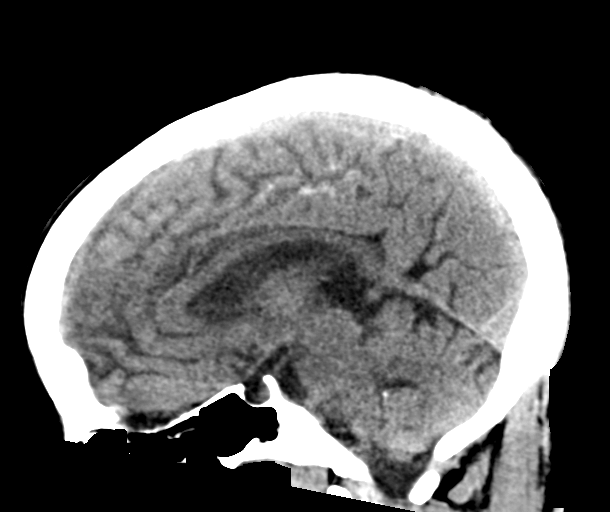
[im 39/58  brain]
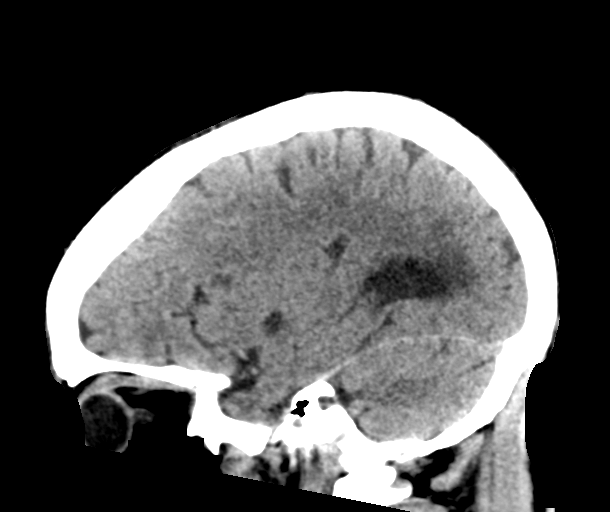

[17 of 47 positions shown; findings below may reference images not displayed]

FINDINGS: Brain: No mass lesion, intraparenchymal hemorrhage or extra-axial
collection. No evidence of acute cortical infarct. Diffuse atrophy
with enlarged sylvian fissures is unchanged. Prominent left
lentiform nucleus perivascular space is unchanged. There is
periventricular hypoattenuation compatible with chronic
microvascular disease.

Vascular: No hyperdense vessel or unexpected calcification.

Skull: Normal visualized skull base, calvarium and extracranial soft
tissues.

Sinuses/Orbits: No sinus fluid levels or advanced mucosal
thickening. No mastoid effusion. Normal orbits.
IMPRESSION: Severely age advanced atrophy, unchanged, without acute abnormality.

## 2018-09-13 IMAGING — MR MR HEAD W/O CM
9 of 10 series · 37 of 48 positions shown · non-contrast
Comparison: Head CT 06/03/2017.  Brain MRI 11/22/2014 and earlier.

CLINICAL DATA: 51-year-old female with dizziness, unsteady gait.
Dialysis patient.

EXAM:
MRI HEAD WITHOUT CONTRAST
TECHNIQUE: Multiplanar, multiecho pulse sequences of the brain and surrounding
structures were obtained without intravenous contrast.

[Series 3: DWI · axial · 3.0mm · 1.09mm/px · z∈[-88,+46]mm · 11 of 98 slices shown (1 of 4)]
[im 1/98]
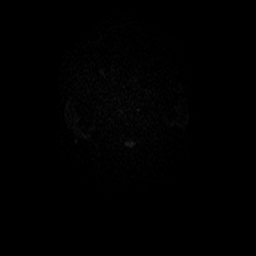
[im 10/98]
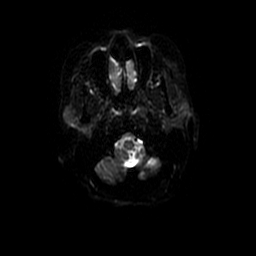
[im 20/98]
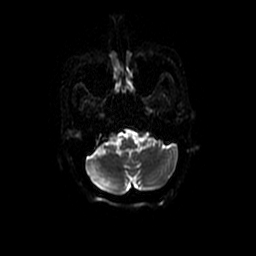
[im 30/98]
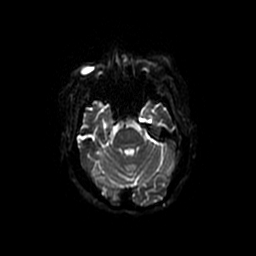
[im 39/98]
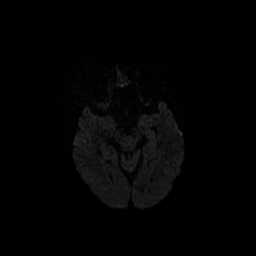
[im 49/98]
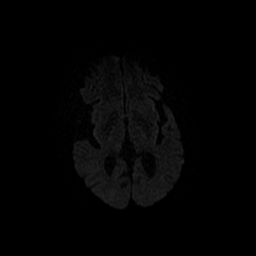
[im 59/98]
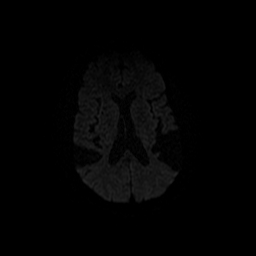
[im 68/98]
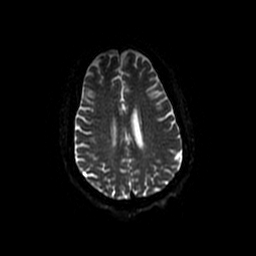
[im 78/98]
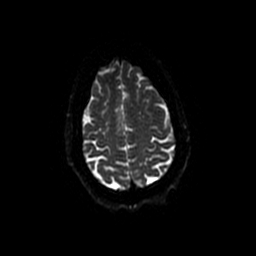
[im 88/98]
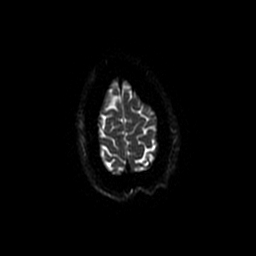
[im 98/98]
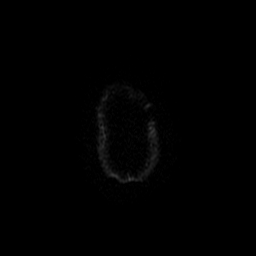

[Series 4: DWI · coronal · 5.0mm · 1.09mm/px · 7 of 70 slices shown (2 of 4)]
[im 1/70]
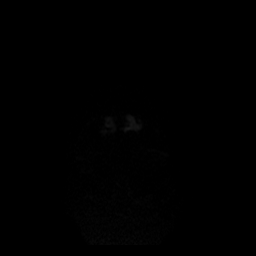
[im 12/70]
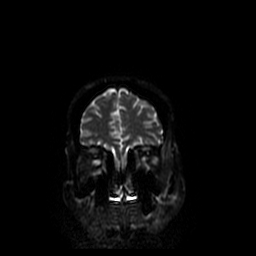
[im 24/70]
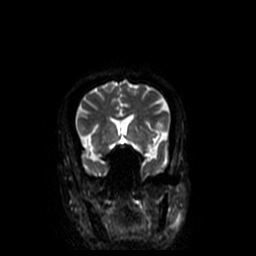
[im 35/70]
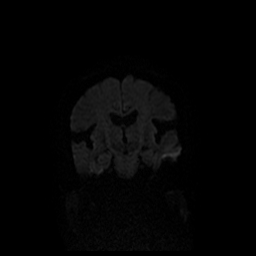
[im 47/70]
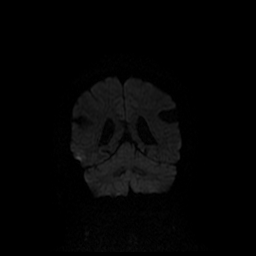
[im 58/70]
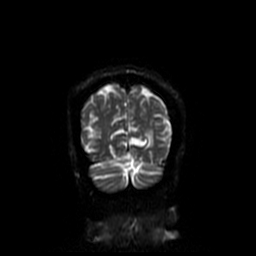
[im 70/70]
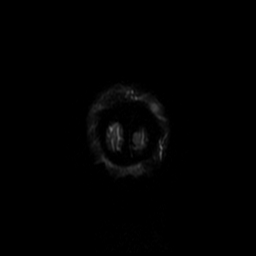

[Series 5: T1 · sagittal · 5.0mm · 0.47mm/px · 2 of 23 slices shown]
[im 1/23]
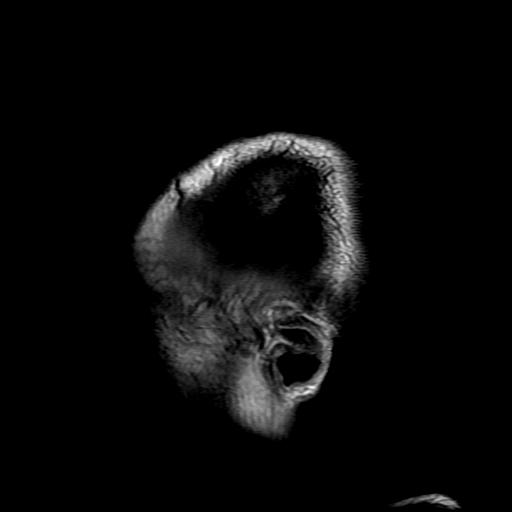
[im 23/23]
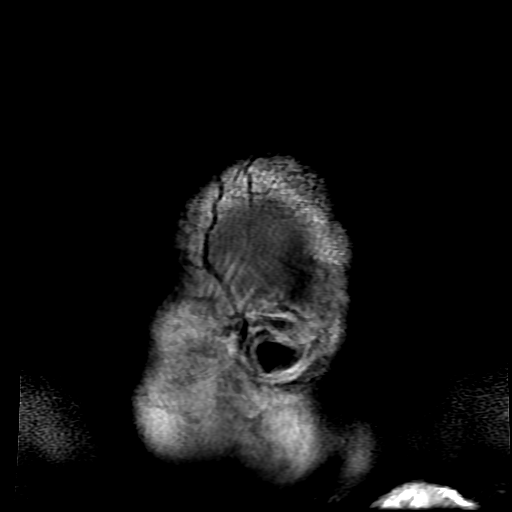

[Series 6: T2 · axial · 5.0mm · 0.43mm/px · z∈[-52,+83]mm · 2 of 24 slices shown (1 of 2)]
[im 1/24]
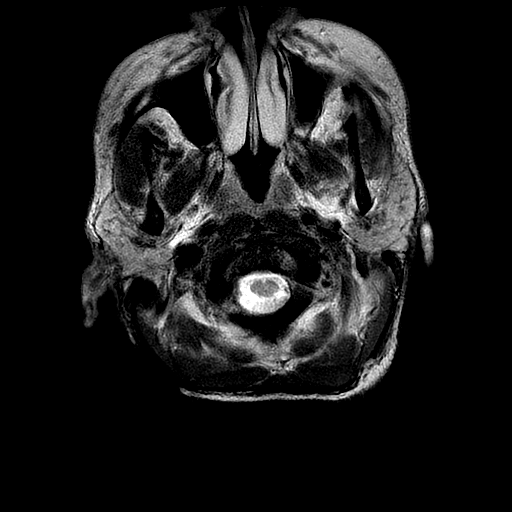
[im 24/24]
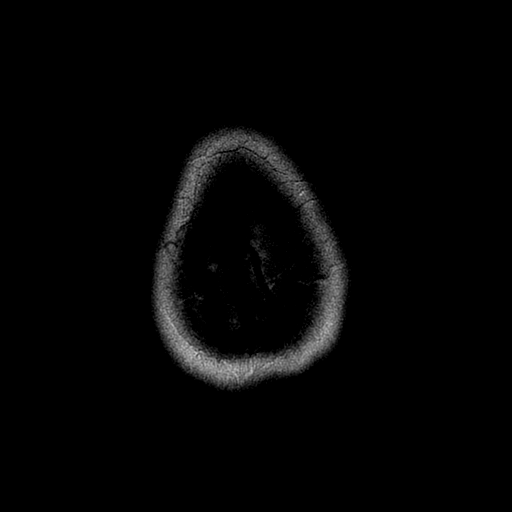

[Series 7: FLAIR · axial · 5.0mm · 0.43mm/px · z∈[-52,+83]mm · 2 of 24 slices shown]
[im 1/24]
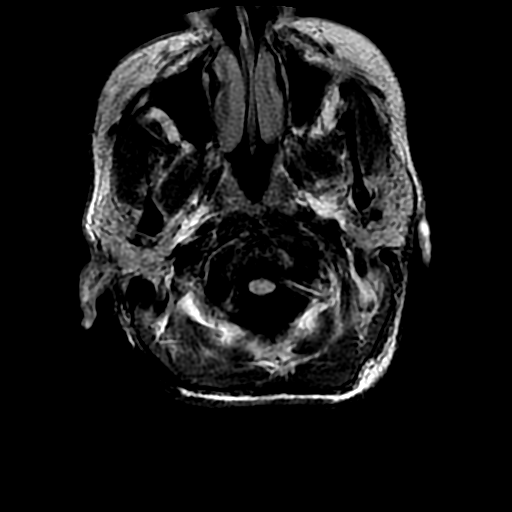
[im 24/24]
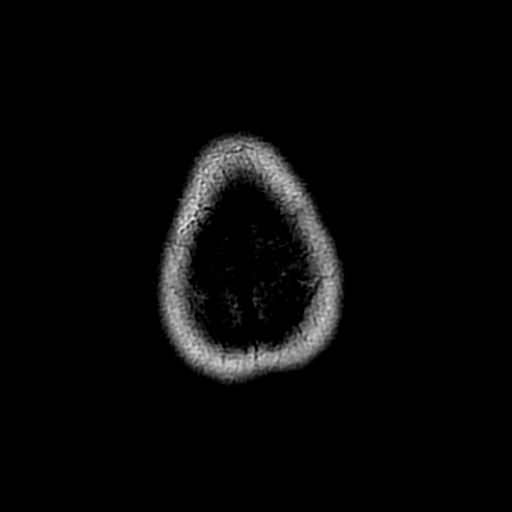

[Series 8: T2 · coronal · 5.0mm · 0.43mm/px · 3 of 28 slices shown (2 of 2)]
[im 1/28]
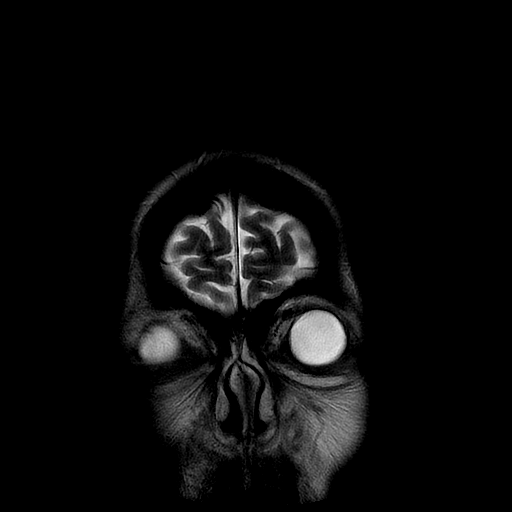
[im 14/28]
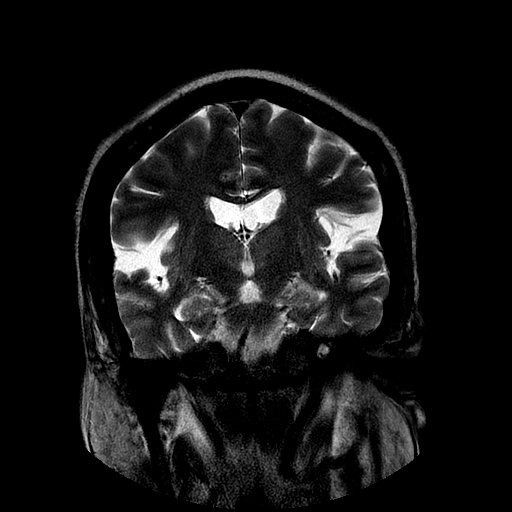
[im 28/28]
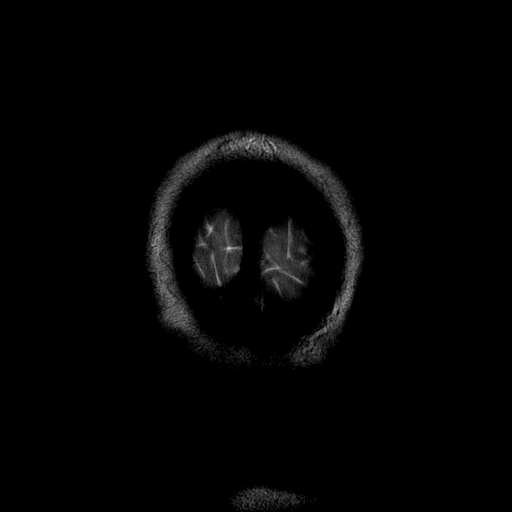

[Series 9: ax mpgr · axial · 5.0mm · 0.45mm/px · 1 of 21 slices shown]
[im 1/21]
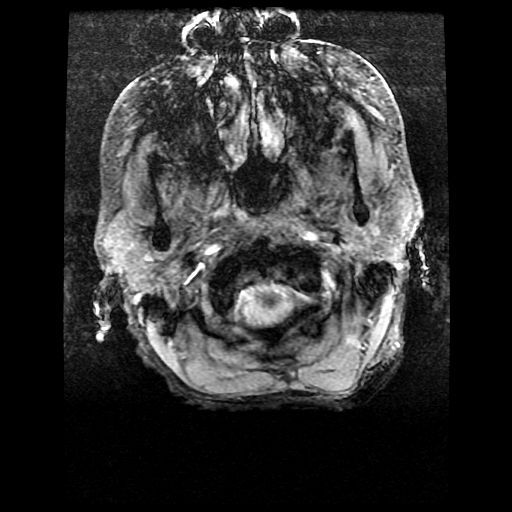

[Series 300: DWI · axial · 3.0mm · 1.09mm/px · z∈[-88,+46]mm · 5 of 49 slices shown (3 of 4)]
[im 1/49]
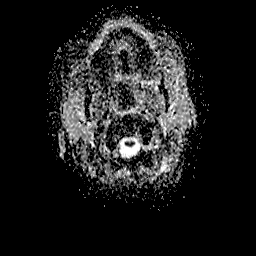
[im 13/49]
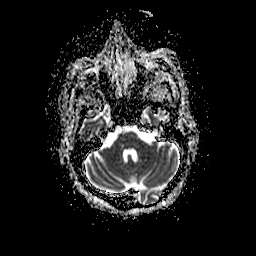
[im 25/49]
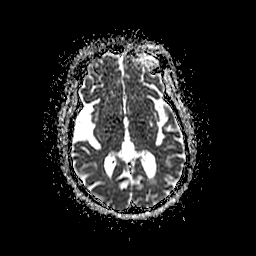
[im 37/49]
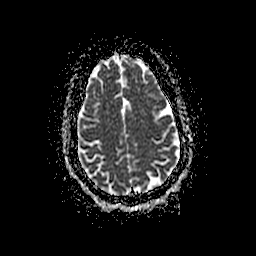
[im 49/49]
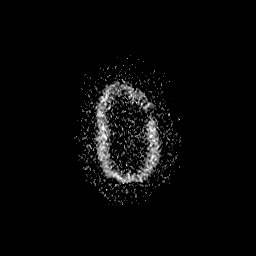

[Series 400: DWI · coronal · 5.0mm · 1.09mm/px · 4 of 35 slices shown (4 of 4)]
[im 1/35]
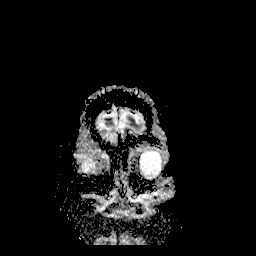
[im 12/35]
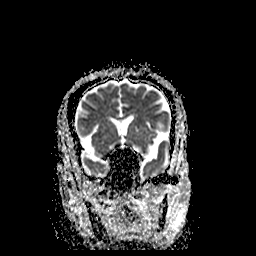
[im 23/35]
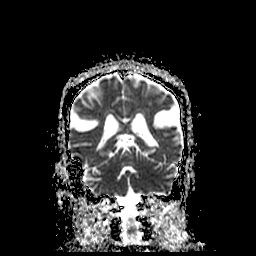
[im 35/35]
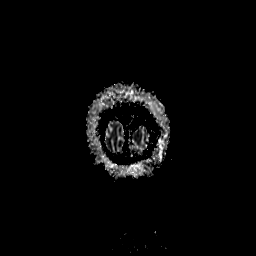

[37 of 48 positions shown; findings below may reference images not displayed]

FINDINGS: Brain: 2 questionable punctate areas of restricted diffusion in the
anterior and superior left frontal lobe on series 3, images 38 and
40. No other restricted diffusion.

Stable cerebral volume including prominent perisylvian volume loss.
No midline shift, mass effect, evidence of mass lesion,
ventriculomegaly, extra-axial collection or acute intracranial
hemorrhage. Cervicomedullary junction and pituitary are within
normal limits.

Chronic patchy periatrial white matter T2 and FLAIR hyperintensity
on the left with otherwise scattered small nonspecific T2 and FLAIR
hyperintense foci in the white matter. No definite cortical
encephalomalacia or chronic cerebral blood products. Chronic
perivascular space left inferior lentiform. Deep gray matter nuclei,
brainstem, and cerebellum remain within normal limits.

Vascular: Major intracranial vascular flow voids are stable.

Skull and upper cervical spine: Decreased T1 bone marrow signal
since 0263. Stable and negative visualized cervical spine otherwise.

Sinuses/Orbits: Stable and negative.

Other: Visible internal auditory structures appear stable and
normal. Mastoid air cells are clear. Scalp and face soft tissues
appear negative.
IMPRESSION: 1. No explanation for acute dizziness, but there are two
questionable punctate areas of acute ischemia in the left frontal
lobe (left MCA territory). These may be artifact.
2. No other acute intracranial abnormality and otherwise stable MRI
appearance of the brain since [DATE]. Decreased T1 bone marrow signal since [DATE] reflect renal
osteodystrophy in this clinical setting.

## 2018-09-23 ENCOUNTER — Ambulatory Visit (INDEPENDENT_AMBULATORY_CARE_PROVIDER_SITE_OTHER): Payer: Medicare Other | Admitting: Family

## 2018-10-05 ENCOUNTER — Other Ambulatory Visit: Payer: Self-pay

## 2018-10-05 ENCOUNTER — Inpatient Hospital Stay (HOSPITAL_COMMUNITY)
Admission: EM | Admit: 2018-10-05 | Discharge: 2018-10-10 | DRG: 377 | Disposition: A | Discharge status: Skilled Nursing Facility | Payer: Medicare Other | Source: Skilled Nursing Facility | Attending: Internal Medicine | Admitting: Internal Medicine

## 2018-10-05 ENCOUNTER — Emergency Department (HOSPITAL_COMMUNITY): Payer: Medicare Other

## 2018-10-05 ENCOUNTER — Encounter (HOSPITAL_COMMUNITY): Payer: Self-pay

## 2018-10-05 DIAGNOSIS — I248 Other forms of acute ischemic heart disease: Secondary | ICD-10-CM | POA: Diagnosis present

## 2018-10-05 DIAGNOSIS — Z531 Procedure and treatment not carried out because of patient's decision for reasons of belief and group pressure: Secondary | ICD-10-CM | POA: Diagnosis present

## 2018-10-05 DIAGNOSIS — I5042 Chronic combined systolic (congestive) and diastolic (congestive) heart failure: Secondary | ICD-10-CM | POA: Diagnosis present

## 2018-10-05 DIAGNOSIS — Z9119 Patient's noncompliance with other medical treatment and regimen: Secondary | ICD-10-CM

## 2018-10-05 DIAGNOSIS — F17210 Nicotine dependence, cigarettes, uncomplicated: Secondary | ICD-10-CM | POA: Diagnosis present

## 2018-10-05 DIAGNOSIS — K921 Melena: Principal | ICD-10-CM | POA: Diagnosis present

## 2018-10-05 DIAGNOSIS — K922 Gastrointestinal hemorrhage, unspecified: Secondary | ICD-10-CM | POA: Diagnosis not present

## 2018-10-05 DIAGNOSIS — R9431 Abnormal electrocardiogram [ECG] [EKG]: Secondary | ICD-10-CM

## 2018-10-05 DIAGNOSIS — R51 Headache: Secondary | ICD-10-CM | POA: Diagnosis not present

## 2018-10-05 DIAGNOSIS — D62 Acute posthemorrhagic anemia: Secondary | ICD-10-CM | POA: Diagnosis present

## 2018-10-05 DIAGNOSIS — Z833 Family history of diabetes mellitus: Secondary | ICD-10-CM

## 2018-10-05 DIAGNOSIS — Z789 Other specified health status: Secondary | ICD-10-CM

## 2018-10-05 DIAGNOSIS — Z794 Long term (current) use of insulin: Secondary | ICD-10-CM | POA: Diagnosis not present

## 2018-10-05 DIAGNOSIS — Z90710 Acquired absence of both cervix and uterus: Secondary | ICD-10-CM

## 2018-10-05 DIAGNOSIS — N186 End stage renal disease: Secondary | ICD-10-CM | POA: Diagnosis not present

## 2018-10-05 DIAGNOSIS — E1140 Type 2 diabetes mellitus with diabetic neuropathy, unspecified: Secondary | ICD-10-CM | POA: Diagnosis present

## 2018-10-05 DIAGNOSIS — D631 Anemia in chronic kidney disease: Secondary | ICD-10-CM | POA: Diagnosis present

## 2018-10-05 DIAGNOSIS — Z992 Dependence on renal dialysis: Secondary | ICD-10-CM | POA: Diagnosis not present

## 2018-10-05 DIAGNOSIS — Z7189 Other specified counseling: Secondary | ICD-10-CM | POA: Diagnosis not present

## 2018-10-05 DIAGNOSIS — Z91199 Patient's noncompliance with other medical treatment and regimen due to unspecified reason: Secondary | ICD-10-CM

## 2018-10-05 DIAGNOSIS — Z79899 Other long term (current) drug therapy: Secondary | ICD-10-CM

## 2018-10-05 DIAGNOSIS — G894 Chronic pain syndrome: Secondary | ICD-10-CM | POA: Diagnosis present

## 2018-10-05 DIAGNOSIS — Z8673 Personal history of transient ischemic attack (TIA), and cerebral infarction without residual deficits: Secondary | ICD-10-CM | POA: Diagnosis not present

## 2018-10-05 DIAGNOSIS — F259 Schizoaffective disorder, unspecified: Secondary | ICD-10-CM | POA: Diagnosis present

## 2018-10-05 DIAGNOSIS — K219 Gastro-esophageal reflux disease without esophagitis: Secondary | ICD-10-CM | POA: Diagnosis present

## 2018-10-05 DIAGNOSIS — Z89432 Acquired absence of left foot: Secondary | ICD-10-CM

## 2018-10-05 DIAGNOSIS — E875 Hyperkalemia: Secondary | ICD-10-CM | POA: Diagnosis not present

## 2018-10-05 DIAGNOSIS — I132 Hypertensive heart and chronic kidney disease with heart failure and with stage 5 chronic kidney disease, or end stage renal disease: Secondary | ICD-10-CM | POA: Diagnosis present

## 2018-10-05 DIAGNOSIS — E785 Hyperlipidemia, unspecified: Secondary | ICD-10-CM | POA: Diagnosis present

## 2018-10-05 DIAGNOSIS — Z515 Encounter for palliative care: Secondary | ICD-10-CM | POA: Diagnosis not present

## 2018-10-05 DIAGNOSIS — E119 Type 2 diabetes mellitus without complications: Secondary | ICD-10-CM | POA: Diagnosis not present

## 2018-10-05 DIAGNOSIS — E1122 Type 2 diabetes mellitus with diabetic chronic kidney disease: Secondary | ICD-10-CM | POA: Diagnosis present

## 2018-10-05 DIAGNOSIS — E7800 Pure hypercholesterolemia, unspecified: Secondary | ICD-10-CM | POA: Diagnosis present

## 2018-10-05 DIAGNOSIS — N2581 Secondary hyperparathyroidism of renal origin: Secondary | ICD-10-CM | POA: Diagnosis present

## 2018-10-05 LAB — COMPREHENSIVE METABOLIC PANEL
ALK PHOS: 125 U/L (ref 38–126)
ALT: 50 U/L — AB (ref 0–44)
AST: 46 U/L — ABNORMAL HIGH (ref 15–41)
Albumin: 3 g/dL — ABNORMAL LOW (ref 3.5–5.0)
Anion gap: 15 (ref 5–15)
BILIRUBIN TOTAL: 0.8 mg/dL (ref 0.3–1.2)
BUN: 128 mg/dL — ABNORMAL HIGH (ref 6–20)
CALCIUM: 8.2 mg/dL — AB (ref 8.9–10.3)
CHLORIDE: 93 mmol/L — AB (ref 98–111)
CO2: 26 mmol/L (ref 22–32)
Creatinine, Ser: 9.77 mg/dL — ABNORMAL HIGH (ref 0.44–1.00)
GFR, EST AFRICAN AMERICAN: 5 mL/min — AB (ref >60)
GFR, EST NON AFRICAN AMERICAN: 4 mL/min — AB (ref >60)
Glucose, Bld: 95 mg/dL (ref 70–99)
Potassium: 6.6 mmol/L (ref 3.5–5.1)
Sodium: 134 mmol/L — ABNORMAL LOW (ref 135–145)
Total Protein: 6.6 g/dL (ref 6.5–8.1)

## 2018-10-05 LAB — CBC WITH DIFFERENTIAL/PLATELET
Abs Immature Granulocytes: 0.02 10*3/uL (ref 0.00–0.07)
BASOS ABS: 0 10*3/uL (ref 0.0–0.1)
Band Neutrophils: 0 %
Basophils Absolute: 0 10*3/uL (ref 0.0–0.1)
Basophils Relative: 0 %
Basophils Relative: 0 %
Blasts: 0 %
Eosinophils Absolute: 0.2 10*3/uL (ref 0.0–0.5)
Eosinophils Absolute: 0 10*3/uL (ref 0.0–0.5)
Eosinophils Relative: 3 %
Eosinophils Relative: 0 %
HEMATOCRIT: 14.8 % — AB (ref 36.0–46.0)
HEMATOCRIT: 13.3 % — AB (ref 36.0–46.0)
Hemoglobin: 4.6 g/dL — CL (ref 12.0–15.0)
Hemoglobin: 4.2 g/dL — CL (ref 12.0–15.0)
Immature Granulocytes: 1 %
LYMPHS ABS: 0.5 10*3/uL — AB (ref 0.7–4.0)
LYMPHS PCT: 22 %
Lymphocytes Relative: 11 %
Lymphs Abs: 1.2 10*3/uL (ref 0.7–4.0)
MCH: 30.9 pg (ref 26.0–34.0)
MCH: 31.6 pg (ref 26.0–34.0)
MCHC: 31.1 g/dL (ref 30.0–36.0)
MCHC: 31.6 g/dL (ref 30.0–36.0)
MCV: 99.3 fL (ref 80.0–100.0)
MCV: 100 fL (ref 80.0–100.0)
METAMYELOCYTES PCT: 0 %
MONOS PCT: 8 %
Monocytes Absolute: 0.4 10*3/uL (ref 0.1–1.0)
Monocytes Absolute: 0.3 10*3/uL (ref 0.1–1.0)
Monocytes Relative: 7 %
Myelocytes: 0 %
NEUTROS ABS: 3.5 10*3/uL (ref 1.7–7.7)
NRBC: 0 % (ref 0.0–0.2)
Neutro Abs: 3.3 10*3/uL (ref 1.7–7.7)
Neutrophils Relative %: 67 %
Neutrophils Relative %: 81 %
Other: 0 %
PLATELETS: 155 10*3/uL (ref 150–400)
PROMYELOCYTES RELATIVE: 0 %
Platelets: 128 10*3/uL — ABNORMAL LOW (ref 150–400)
RBC: 1.49 MIL/uL — AB (ref 3.87–5.11)
RBC: 1.33 MIL/uL — ABNORMAL LOW (ref 3.87–5.11)
RDW: 16.6 % — ABNORMAL HIGH (ref 11.5–15.5)
RDW: 17.5 % — ABNORMAL HIGH (ref 11.5–15.5)
WBC: 5.3 10*3/uL (ref 4.0–10.5)
WBC: 4 10*3/uL (ref 4.0–10.5)
nRBC: 0 /100 WBC
nRBC: 0 % (ref 0.0–0.2)

## 2018-10-05 LAB — PROTIME-INR
INR: 1.4 — AB (ref 0.8–1.2)
Prothrombin Time: 16.6 seconds — ABNORMAL HIGH (ref 11.4–15.2)

## 2018-10-05 LAB — BASIC METABOLIC PANEL
Anion gap: 17 — ABNORMAL HIGH (ref 5–15)
Anion gap: 16 — ABNORMAL HIGH (ref 5–15)
BUN: 134 mg/dL — AB (ref 6–20)
BUN: 48 mg/dL — ABNORMAL HIGH (ref 6–20)
CO2: 23 mmol/L (ref 22–32)
CO2: 22 mmol/L (ref 22–32)
Calcium: 9.2 mg/dL (ref 8.9–10.3)
Calcium: 8.2 mg/dL — ABNORMAL LOW (ref 8.9–10.3)
Chloride: 96 mmol/L — ABNORMAL LOW (ref 98–111)
Chloride: 99 mmol/L (ref 98–111)
Creatinine, Ser: 9.75 mg/dL — ABNORMAL HIGH (ref 0.44–1.00)
Creatinine, Ser: 4.44 mg/dL — ABNORMAL HIGH (ref 0.44–1.00)
GFR calc Af Amer: 5 mL/min — ABNORMAL LOW (ref >60)
GFR calc Af Amer: 12 mL/min — ABNORMAL LOW (ref >60)
GFR calc non Af Amer: 4 mL/min — ABNORMAL LOW (ref >60)
GFR calc non Af Amer: 11 mL/min — ABNORMAL LOW (ref >60)
Glucose, Bld: 136 mg/dL — ABNORMAL HIGH (ref 70–99)
Glucose, Bld: 153 mg/dL — ABNORMAL HIGH (ref 70–99)
Potassium: 6.7 mmol/L (ref 3.5–5.1)
Potassium: 4.5 mmol/L (ref 3.5–5.1)
Sodium: 136 mmol/L (ref 135–145)
Sodium: 137 mmol/L (ref 135–145)

## 2018-10-05 LAB — MRSA PCR SCREENING: MRSA by PCR: POSITIVE — AB

## 2018-10-05 LAB — CBC
HCT: 14.1 % — ABNORMAL LOW (ref 36.0–46.0)
Hemoglobin: 4.6 g/dL — CL (ref 12.0–15.0)
MCH: 31.9 pg (ref 26.0–34.0)
MCHC: 32.6 g/dL (ref 30.0–36.0)
MCV: 97.9 fL (ref 80.0–100.0)
Platelets: 129 10*3/uL — ABNORMAL LOW (ref 150–400)
RBC: 1.44 MIL/uL — ABNORMAL LOW (ref 3.87–5.11)
RDW: 16.7 % — ABNORMAL HIGH (ref 11.5–15.5)
WBC: 4 10*3/uL (ref 4.0–10.5)
nRBC: 0 % (ref 0.0–0.2)

## 2018-10-05 LAB — TROPONIN I
Troponin I: 0.04 ng/mL (ref <0.03)
Troponin I: 0.03 ng/mL (ref <0.03)

## 2018-10-05 LAB — POC OCCULT BLOOD, ED: FECAL OCCULT BLD: POSITIVE — AB

## 2018-10-05 LAB — GLUCOSE, CAPILLARY
Glucose-Capillary: 122 mg/dL — ABNORMAL HIGH (ref 70–99)
Glucose-Capillary: 120 mg/dL — ABNORMAL HIGH (ref 70–99)
Glucose-Capillary: 150 mg/dL — ABNORMAL HIGH (ref 70–99)
Glucose-Capillary: 166 mg/dL — ABNORMAL HIGH (ref 70–99)

## 2018-10-05 LAB — I-STAT TROPONIN, ED: TROPONIN I, POC: 0.03 ng/mL (ref 0.00–0.08)

## 2018-10-05 LAB — CBG MONITORING, ED: Glucose-Capillary: 127 mg/dL — ABNORMAL HIGH (ref 70–99)

## 2018-10-05 MED ORDER — CHLORHEXIDINE GLUCONATE CLOTH 2 % EX PADS
6.0000 | MEDICATED_PAD | Freq: Every day | CUTANEOUS | Status: DC
  Administered 2018-10-06 – 2018-10-09 (×3): 6 via TOPICAL

## 2018-10-05 MED ORDER — ACETAMINOPHEN 325 MG PO TABS: 650.0000 mg | ORAL_TABLET | Freq: Four times a day (QID) | ORAL | Status: DC | PRN

## 2018-10-05 MED ORDER — SODIUM CHLORIDE 0.9 % IV BOLUS
250.0000 mL | Freq: Once | INTRAVENOUS | Status: AC
  Administered 2018-10-05: 250 mL via INTRAVENOUS

## 2018-10-05 MED ORDER — DEXTROSE 50 % IV SOLN
1.0000 | Freq: Once | INTRAVENOUS | Status: AC
  Administered 2018-10-05: 50 mL via INTRAVENOUS
  Filled 2018-10-05: qty 50

## 2018-10-05 MED ORDER — MORPHINE SULFATE (PF) 2 MG/ML IV SOLN
0.5000 mg | INTRAVENOUS | Status: DC | PRN
  Administered 2018-10-05 – 2018-10-09 (×10): 0.5 mg via INTRAVENOUS
  Filled 2018-10-05 (×9): qty 1

## 2018-10-05 MED ORDER — DARBEPOETIN ALFA 200 MCG/0.4ML IJ SOSY
200.0000 ug | PREFILLED_SYRINGE | INTRAMUSCULAR | Status: DC
  Administered 2018-10-05: 200 ug via INTRAVENOUS
  Filled 2018-10-05: qty 0.4

## 2018-10-05 MED ORDER — SODIUM CHLORIDE 0.9 % IV SOLN
510.0000 mg | Freq: Once | INTRAVENOUS | Status: AC
  Administered 2018-10-05: 510 mg via INTRAVENOUS
  Filled 2018-10-05: qty 17

## 2018-10-05 MED ORDER — INSULIN ASPART 100 UNIT/ML IV SOLN
10.0000 [IU] | Freq: Once | INTRAVENOUS | Status: AC
  Administered 2018-10-05: 10 [IU] via INTRAVENOUS

## 2018-10-05 MED ORDER — ALBUTEROL SULFATE (2.5 MG/3ML) 0.083% IN NEBU
10.0000 mg | INHALATION_SOLUTION | Freq: Once | RESPIRATORY_TRACT | Status: AC
  Administered 2018-10-05: 10 mg via RESPIRATORY_TRACT
  Filled 2018-10-05: qty 12

## 2018-10-05 MED ORDER — SODIUM ZIRCONIUM CYCLOSILICATE 10 G PO PACK
10.0000 g | PACK | ORAL | Status: AC
  Administered 2018-10-05: 10 g via ORAL
  Filled 2018-10-05: qty 1

## 2018-10-05 MED ORDER — SODIUM BICARBONATE 8.4 % IV SOLN
50.0000 meq | Freq: Once | INTRAVENOUS | Status: AC
  Administered 2018-10-05: 50 meq via INTRAVENOUS
  Filled 2018-10-05: qty 50

## 2018-10-05 MED ORDER — CALCIUM GLUCONATE-NACL 1-0.675 GM/50ML-% IV SOLN
1.0000 g | Freq: Once | INTRAVENOUS | Status: AC
  Administered 2018-10-05: 1000 mg via INTRAVENOUS
  Filled 2018-10-05: qty 50

## 2018-10-05 MED ORDER — ONDANSETRON HCL 4 MG/2ML IJ SOLN
4.0000 mg | Freq: Four times a day (QID) | INTRAMUSCULAR | Status: DC | PRN
  Administered 2018-10-05 – 2018-10-08 (×5): 4 mg via INTRAVENOUS
  Filled 2018-10-05 (×5): qty 2

## 2018-10-05 MED ORDER — EPOETIN ALFA 10000 UNIT/ML IJ SOLN
8000.0000 [IU] | Freq: Once | INTRAMUSCULAR | Status: DC
  Filled 2018-10-05: qty 1

## 2018-10-05 MED ORDER — INSULIN ASPART 100 UNIT/ML ~~LOC~~ SOLN
0.0000 [IU] | Freq: Three times a day (TID) | SUBCUTANEOUS | Status: DC
  Administered 2018-10-05: 1 [IU] via SUBCUTANEOUS
  Administered 2018-10-06: 2 [IU] via SUBCUTANEOUS
  Administered 2018-10-07: 1 [IU] via SUBCUTANEOUS
  Administered 2018-10-07: 5 [IU] via SUBCUTANEOUS
  Administered 2018-10-07: 1 [IU] via SUBCUTANEOUS
  Administered 2018-10-08: 7 [IU] via SUBCUTANEOUS
  Administered 2018-10-08: 2 [IU] via SUBCUTANEOUS
  Administered 2018-10-09: 7 [IU] via SUBCUTANEOUS
  Administered 2018-10-09: 3 [IU] via SUBCUTANEOUS
  Administered 2018-10-09 – 2018-10-10 (×2): 5 [IU] via SUBCUTANEOUS

## 2018-10-05 MED ORDER — DARBEPOETIN ALFA 200 MCG/0.4ML IJ SOSY
PREFILLED_SYRINGE | INTRAMUSCULAR | Status: AC
  Administered 2018-10-05: 200 ug via INTRAVENOUS
  Filled 2018-10-05: qty 0.4

## 2018-10-05 NOTE — Procedures (Signed)
   I was present at this dialysis session, have reviewed the session itself and made  appropriate changes Kelly Splinter MD [place] pager [phone]   10/05/2018, 11:41 AM

## 2018-10-05 NOTE — H&P (Signed)
History and Physical    [name] GEZ:[id number] DOB: [birth year] DOA: 10/05/2018  PCP: Patient, [name] Patient coming from: Home  Chief Complaint: Bloody bowel movements  HPI: [name] is a 53 y.o. female with medical history significant of ESRD on HD TTS, chronic combined systolic and diastolic congestive heart failure, hypertension, hyperlipidemia, type 2 diabetes, CVA/TIA presenting to the hospital for evaluation of bloody bowel movements. Patient reports having large volume grossly bloody bowel movements for the past 2 days.  Pictures on her phone showing large volume maroon-colored blood with very large blood clots.  Reports having slight lower abdominal discomfort.  She is not on any blood thinners.  Reports taking 4 tablets of ibuprofen yesterday for chronic pain in her feet.  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  States she missed dialysis on Saturday.  She has been feeling dizzy but denies having any chest pain or shortness of breath.  Review of Systems: As per HPI otherwise 10 point review of systems negative.  Past Medical History:  Diagnosis Date  . Anemia   . Anxiety   . Bipolar disorder, unspecified ([place]) 02/06/2014  . Chronic combined systolic and diastolic CHF (congestive heart failure) (HCC)    EF 40% by echo and 48% by stress test  . Chronic pain syndrome   . Depression   . ESRD on hemodialysis ([place])   . GERD (gastroesophageal reflux disease)   . Glaucoma   . Headache    sinus headaches  . Hepatitis C   . High cholesterol   . History of hiatal hernia   . History of vitamin D deficiency 06/20/2015  . Hypertension   . MRSA infection ?2014   "on the back of my head; spread throughout my bloodstream"  . Neuropathy   . Refusal of blood transfusions as patient is Jehovah's Witness   . Schizoaffective disorder, unspecified condition 02/08/2014  . Seizure [facility]) dx'd 2014   "don't know what kind; last one was ~ 04/2014"  .  Stroke [facility])    TIA 2009  . TIA (transient ischemic attack) 2010  . Type II diabetes mellitus ([place])    INSULIN DEPENDENT    Past Surgical History:  Procedure Laterality Date  . ABDOMINAL HYSTERECTOMY  2014  . ABDOMINAL HYSTERECTOMY  2010  . AMPUTATION Left 05/21/2013   Procedure: Left Midfoot AMPUTATION;  Surgeon: Newt Minion, MD;  Location: [place];  Service: Orthopedics;  Laterality: Left;  Left Midfoot Amputation  . AV FISTULA PLACEMENT Left 05/04/2015   Procedure: ARTERIOVENOUS (AV) GRAFT INSERTION;  Surgeon: Angelia Mould, MD;  Location: [place];  Service: Vascular;  Laterality: Left;  . COLONOSCOPY WITH PROPOFOL N/A 06/22/2013   Procedure: COLONOSCOPY WITH PROPOFOL;  Surgeon: Jeryl Columbia, MD;  Location: [facility] ENDOSCOPY;  Service: Endoscopy;  Laterality: N/A;  . ESOPHAGOGASTRODUODENOSCOPY N/A 05/11/2015   Procedure: ESOPHAGOGASTRODUODENOSCOPY (EGD);  Surgeon: Laurence Spates, MD;  Location: [facility] ENDOSCOPY;  Service: Endoscopy;  Laterality: N/A;  . EYE SURGERY Bilateral 2010   Lasik  . FLEXIBLE SIGMOIDOSCOPY N/A 01/17/2017   Procedure: FLEXIBLE SIGMOIDOSCOPY w/ hemorrhoid banding possible;  Surgeon: Gatha Mayer, MD;  Location[facility] END[facility]COPY;  Service: Endoscopy;  Laterality: N/A;  . FOOT AMPUTATION Left    due diabetic neuropathy, could not feel ulcer on bottom of foot  . REFRACTIVE SURGERY Bilateral 2013  . TONSILLECTOMY  1970's  . TUBAL LIGATION  2000     reports that she has been smoking  cigarettes. She has a 3.00 pack-year smoking history. She has never used smokeless tobacco. She reports current alcohol use of about 3.0 standard drinks of alcohol per week. She reports current drug use. Drugs: "Crack" cocaine and Cocaine.  Allergies  Allergen Reactions  . Penicillins Hives and Other (See Comments)    Has patient had a PCN reaction causing immediate rash, facial/tongue/throat swelling, SOB or lightheadedness with hypotension: Yes Has patient had a PCN reaction causing  severe rash involving mucus membranes or skin necrosis: No Has patient had a PCN reaction that required hospitalization No Has patient had a PCN reaction occurring within the last 10 years: No If all of the above answers are "NO", then may proceed with Cephalosporin use. Pt tolerates cefepime and ceftriaxone  . Gabapentin Itching and Swelling  . Lyrica [Pregabalin] Swelling and Other (See Comments)    Facial and leg swelling  . Saphris [Asenapine] Swelling and Other (See Comments)    Facial swelling  . Tramadol Swelling and Other (See Comments)    Leg swelling  . Adhesive [Tape] Itching    SEVERE ITCHING  . Latex Itching    SEVERE ITCHING  . Other Other (See Comments)    NO BLOOD PRODUCTS; PATIENT IS A JEHOVAH'S WITNESS    Family History  Problem Relation Age of Onset  . Hypertension Mother   . Diabetes Mother   . Hypertension Father   . Diabetes Father     Prior to Admission medications   Medication Sig Start Date End Date Taking? Authorizing Provider  albuterol (PROVENTIL HFA;VENTOLIN HFA) 108 (90 Base) MCG/ACT inhaler Inhale 2 puffs into the lungs every 4 (four) hours as needed for wheezing or shortness of breath. 10/15/16  Yes Horton, Barbette Hair, MD  albuterol (PROVENTIL) (2.5 MG/3ML) 0.083% nebulizer solution Take 2.5 mg by nebulization every 6 (six) hours as needed for wheezing or shortness of breath.   Yes [provider]  allopurinol (ZYLOPRIM) 100 MG tablet Take 1 tablet (100 mg total) by mouth every Tuesday, Thursday, and Saturday at 6 PM. 01/25/17  Yes Nita Sells, MD  amLODipine (NORVASC) 10 MG tablet Take 1 tablet (10 mg total) by mouth at bedtime. 09/02/16  Yes Hildred Priest, MD  atorvastatin (LIPITOR) 40 MG tablet Take 1 tablet (40 mg total) by mouth at bedtime. 09/02/16  Yes Hildred Priest, MD  benzocaine (ORAJEL) 10 % mucosal gel Use as directed 1 application in the mouth or throat 2 (two) times daily as needed for mouth pain.    Yes [provider]  bisacodyl (DULCOLAX) 10 MG suppository Place 10 mg rectally daily as needed (for constipation).   Yes [provider]  calcium acetate (PHOSLO) 667 MG capsule Take 2 capsules (1,334 mg total) by mouth 3 (three) times daily with meals. 09/02/16  Yes Hildred Priest, MD  camphor-menthol [facility]) lotion Apply 1 application topically as needed for itching.   Yes [provider]  carvedilol (COREG) 25 MG tablet Take 1 tablet (25 mg total) by mouth 2 (two) times daily with a meal. 09/02/16  Yes Hildred Priest, MD  cloNIDine (CATAPRES) 0.1 MG tablet Take 1 tablet (0.1 mg total) by mouth every 8 (eight) hours. Patient taking differently: Take 0.1 mg by mouth every 8 (eight) hours. Hold if SBP is less tha 100 or DBP is less than 60 for hypertension 09/02/16  Yes Hildred Priest, MD  diphenhydrAMINE (BENADRYL) 25 mg capsule Take 25 mg by mouth every 6 (six) hours as needed for itching.  Yes [provider]  docusate sodium (COLACE) 100 MG capsule Take 100 mg by mouth 2 (two) times daily.   Yes [provider]  DULoxetine (CYMBALTA) 30 MG capsule Take 90 mg by mouth daily. Take with 60mg  to equal 90mg  once daily   Yes [provider]  DULoxetine (CYMBALTA) 60 MG capsule Take 90 mg by mouth daily. Take with 30mg  to equal 90mg  once daily    Yes [provider]  Ferrous Sulfate (SLOW FE) 142 (45 Fe) MG TBCR Take 1 tablet by mouth daily. 07/19/17  Yes Mariel Aloe, MD  hydrocortisone (ANUSOL-HC) 2.5 % rectal cream Place rectally 2 (two) times daily. Patient taking differently: Place 1 application rectally 2 (two) times daily as needed for hemorrhoids.  01/24/17  Yes Nita Sells, MD  insulin aspart (NOVOLOG) 100 UNIT/ML injection Inject 7 Units into the skin 3 (three) times daily with meals. 06/07/17  Yes Helberg, Larkin Ina, MD  Insulin Detemir (LEVEMIR FLEXTOUCH) 100 UNIT/ML Pen Inject 20  Units into the skin at bedtime.   Yes [provider]  methocarbamol (ROBAXIN) 500 MG tablet Take 500 mg by mouth 3 (three) times daily as needed for muscle spasms.   Yes [provider]  multivitamin (RENA-VIT) TABS tablet Take 1 tablet by mouth at bedtime. 09/02/16  Yes Hildred Priest, MD  mupirocin ointment (BACTROBAN) 2 % Apply 1 application topically 2 (two) times daily. 09/02/18  Yes Dondra Prader R, NP  ondansetron (ZOFRAN) 4 MG tablet Take 4 mg by mouth every 8 (eight) hours as needed for nausea or vomiting.   Yes [provider]  Oxycodone HCl 10 MG TABS Take 1 tablet (10 mg total) by mouth every 6 (six) hours as needed. Patient taking differently: Take 10 mg by mouth every 6 (six) hours as needed (for pain).  07/19/17  Yes Mariel Aloe, MD  pantoprazole (PROTONIX) 40 MG tablet Take 1 tablet (40 mg total) by mouth daily at 6 (six) AM. 07/20/17  Yes Mariel Aloe, MD  saccharomyces boulardii (FLORASTOR) 250 MG capsule Take 250 mg by mouth daily.   Yes [provider]  senna (SENOKOT) 8.6 MG tablet Take 2 tablets by mouth daily.   Yes [provider]  traZODone (DESYREL) 50 MG tablet Take 0.5 tablets (25 mg total) by mouth at bedtime. Patient taking differently: Take 50 mg by mouth at bedtime.  07/19/17  Yes Mariel Aloe, MD  insulin glargine (LANTUS) 100 UNIT/ML injection Inject 0.2 mLs (20 Units total) into the skin at bedtime. Patient not taking: Reported on 10/05/2018 06/07/17   Ina Homes, MD  LORazepam (ATIVAN) 0.5 MG tablet Take 1 tablet (0.5 mg total) by mouth every 8 (eight) hours as needed for anxiety. Patient not taking: Reported on 10/05/2018 07/19/17   Mariel Aloe, MD  Nutritional Supplements (FEEDING SUPPLEMENT, NEPRO CARB STEADY,) LIQD Take 237 mLs by mouth 3 (three) times daily between meals. Patient not taking: Reported on 09/09/2017 02/28/17   Cristal Ford, DO  sertraline (ZOLOFT) 50 MG tablet Take 1 tablet  (50 mg total) by mouth daily. Patient not taking: Reported on 09/09/2017 07/19/17   Mariel Aloe, MD    Physical Exam: Vitals:   10/05/18 0239 10/05/18 0245 10/05/18 0345 10/05/18 0430  BP: (!) 151/69 (!) 148/65 (!) 141/87 130/61  Pulse:      Resp: 17 17    Temp:      TempSrc:      SpO2:  Weight:      Height:        Physical Exam  Constitutional: She is oriented to person, place, and time. She appears well-developed and well-nourished. No distress.  HENT:  Head: Normocephalic.  Mouth/Throat: Oropharynx is clear and moist.  Eyes: Right eye exhibits no discharge. Left eye exhibits no discharge.  Neck: Neck supple.  Cardiovascular: Normal rate, regular rhythm and intact distal pulses.  Pulmonary/Chest: Effort normal and breath sounds normal. No respiratory distress. She has no wheezes. She has no rales.  Abdominal: Soft. Bowel sounds are normal. She exhibits distension. There is no abdominal tenderness. There is no rebound and no guarding.  Bilateral lower quadrants mildly tender on deep palpation  Musculoskeletal:        General: No edema.  Neurological: She is alert and oriented to person, place, and time.  Skin: Skin is warm and dry. She is not diaphoretic.     Labs on Admission: I have personally reviewed following labs and imaging studies  CBC: Recent Labs  Lab 10/05/18 0155 10/05/18 0351  WBC 5.3 4.0  NEUTROABS 3.5  --   HGB 4.6* 4.6*  HCT 14.8* 14.1*  MCV 99.3 97.9  PLT 155 229*   Basic Metabolic Panel: Recent Labs  Lab 10/05/18 0155  NA 134*  K 6.6*  CL 93*  CO2 26  GLUCOSE 95  BUN 128*  CREATININE 9.77*  CALCIUM 8.2*   GFR: Estimated Creatinine Clearance: 6.8 mL/min (A) (by C-G formula based on SCr of 9.77 mg/dL (H)). Liver Function Tests: Recent Labs  Lab 10/05/18 0155  AST 46*  ALT 50*  ALKPHOS 125  BILITOT 0.8  PROT 6.6  ALBUMIN 3.0*   No results for input(s): LIPASE, AMYLASE in the last 168 hours. No results for input(s):  AMMONIA in the last 168 hours. Coagulation Profile: Recent Labs  Lab 10/05/18 0155  INR 1.4*   Cardiac Enzymes: No results for input(s): CKTOTAL, CKMB, CKMBINDEX, TROPONINI in the last 168 hours. BNP (last 3 results) No results for input(s): PROBNP in the last 8760 hours. HbA1C: No results for input(s): HGBA1C in the last 72 hours. CBG: Recent Labs  Lab 10/05/18 0357  GLUCAP 127*   Lipid Profile: No results for input(s): CHOL, HDL, LDLCALC, TRIG, CHOLHDL, LDLDIRECT in the last 72 hours. Thyroid Function Tests: No results for input(s): TSH, T4TOTAL, FREET4, T3FREE, THYROIDAB in the last 72 hours. Anemia Panel: No results for input(s): VITAMINB12, FOLATE, FERRITIN, TIBC, IRON, RETICCTPCT in the last 72 hours. Urine analysis:    Component Value Date/Time   COLORURINE YELLOW 02/25/2017 1224   APPEARANCEUR HAZY (A) 02/25/2017 1224   LABSPEC 1.013 02/25/2017 1224   PHURINE 8.0 02/25/2017 1224   GLUCOSEU >=500 (A) 02/25/2017 1224   HGBUR SMALL (A) 02/25/2017 1224   BILIRUBINUR NEGATIVE 02/25/2017 1224   BILIRUBINUR neg 12/13/2014 1127   KETONESUR NEGATIVE 02/25/2017 1224   PROTEINUR >=300 (A) 02/25/2017 1224   UROBILINOGEN 0.2 05/01/2015 1252   NITRITE NEGATIVE 02/25/2017 1224   LEUKOCYTESUR NEGATIVE 02/25/2017 1224    Radiological Exams on Admission: Dg Abd Acute 2+v W 1v Chest  Result Date: 10/05/2018 CLINICAL DATA:  Missed dialysis.  GI bleed. EXAM: DG ABDOMEN ACUTE W/ 1V CHEST COMPARISON:  Chest radiographs 03/16/2018 FINDINGS: Chronic cardiomegaly with slight improvement from prior. Chronic vascular congestion. Improved pulmonary edema. No significant pleural fluid. No bowel dilatation to suggest obstruction. Air-filled transverse colon with air-fluid levels, formed stool in the more proximal and distal colon. No evidence of free  air. Advanced vascular calcifications, no radiopaque calculi. No evidence of acute osseous abnormality. IMPRESSION: 1. No free air or bowel  obstruction. Mild gaseous distension of transverse colon with air-fluid levels, formed stool proximal and distally. 2. Chronic cardiomegaly and vascular congestion. Electronically Signed   By: Keith Rake M.D.   On: 10/05/2018 03:02    EKG: Independently reviewed.  Sinus rhythm, peaked T waves, ST depressions in inferior and lateral leads.  Assessment/Plan Principal Problem:   GI bleed Active Problems:   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 (HCC)   Hyperkalemia   ESRD on dialysis ([place])   EKG abnormality   GI bleed, suspect diverticular -Patient reports 2-day history of large-volume maroon-colored bloody bowel movements with blood clots.  Pictures seen on her phone. -Hemoglobin 4.6, was 12.2 five months ago.  FOBT positive. -Abdominal x-ray with no free air or bowel obstruction. -Patient is a Jehovah's Witness and refuses blood transfusions -Flexible sigmoidoscopy done in June 2018 with evidence of diverticulosis. -ED physician discussed with Dr. Cari Caraway, GI will see the patient in the morning. -Continue to monitor CBC -2 large-bore IVs -Keep n.p.o. -Remains hemodynamically stable, blood pressure slightly high. Will closely monitor blood pressure and give IV fluid if blood pressure trends down.  She is a dialysis patient.  Hyperkalemia -In the setting of missed hemodialysis and active bleed.  Potassium 6.6.  EKG with peaked T waves.  Bicarb normal. -Patient received medical therapy for hyperkalemia in the ED including NovoLog 10 units with D50, calcium gluconate 1 g, 1 amp bicarb, and Lokelma. -ED physician discussed with Dr. Justin Mend, patient will be taken for dialysis in the morning. -Repeat BMP now to check potassium level  EKG abnormality -Likely due to demand ischemia from severe anemia in the setting of GI bleed.  EKG with ST depressions in inferior and lateral leads.  Troponin negative.  Patient denies having any chest pain and appears comfortable on exam. -Cardiac  monitoring -Trend troponin -Repeat EKG in a.m.  ESRD on HD -She missed dialysis on 2/29.  Presenting with hyperkalemia.  Does not appear volume overloaded on exam. -Nephrology planning on taking her for dialysis in the morning.  Type 2 diabetes -Blood glucose 128. -Sliding scale insulin and CBG checks.  DVT prophylaxis: SCDs Code Status: Patient wishes to be full code. Family Communication: No family available. Disposition Plan: Anticipate discharge after clinical improvement. Consults called: Nephrology, gastroenterology Admission status: Observation, progressive care unit   Shela Leff MD [facility] Pager [phone]  If 7PM-7AM, please contact night-coverage [email or web address] Password [facility]  10/05/2018, 4:57 AM
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
## 2018-10-05 NOTE — Progress Notes (Signed)
Patient ID: [name], female   DOB: [birth year], 53 y.o.   MRN: [id number] Patient was admitted this morning for rectal bleeding with hemoglobin of 4.6.  Patient seen and examined at bedside undergoing dialysis.  Patient is sleepy, wakes up very slightly.  Does not answer much questions.  As per the nursing staff, last bleeding was around 6 AM and was bloody.  No bleeding episodes reported during dialysis session.  Will get stat H&H.  Will request IR evaluation as per Dr. Belinda Block recommendations as patient is a Sales promotion account executive Witness and we cannot transfuse her.  We will also give IV iron.  Monitor H&H

## 2018-10-05 NOTE — ED Provider Notes (Signed)
[facility] EMERGENCY DEPARTMENT Provider Note   CSN: [id number] Arrival date & time: 10/05/18  0032    History   Chief Complaint Chief Complaint  Patient presents with  . GI Bleeding    HPI [name] is a 53 y.o. female.     The history is provided by the patient.  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 States she was visiting a family member and felt like she had to have a bowel movement and only blood came out.  This has happened spontaneously and with defecation several times and she has passed clots as well.  She is not on anticoagulation at this time.  She has has a similar episode 2 years ago.  She is a Sales promotion account executive witness    Past Medical History:  Diagnosis Date  . Anemia   . Anxiety   . Bipolar disorder, unspecified ([place]) 02/06/2014  . Chronic combined systolic and diastolic CHF (congestive heart failure) (HCC)    EF 40% by echo and 48% by stress test  . Chronic pain syndrome   . Depression   . ESRD on hemodialysis ([place])   . GERD (gastroesophageal reflux disease)   . Glaucoma   . Headache    sinus headaches  . Hepatitis C   . High cholesterol   . History of hiatal hernia   . History of vitamin D deficiency 06/20/2015  . Hypertension   . MRSA infection ?2014   "on the back of my head; spread throughout my bloodstream"  . Neuropathy   . Refusal of blood transfusions as patient is Jehovah's Witness   . Schizoaffective disorder, unspecified  condition 02/08/2014  . Seizure [facility]) dx'd 2014   "don't know what kind; last one was ~ 04/2014"  . Stroke [facility])    TIA 2009  . TIA (transient ischemic attack) 2010  . Type II diabetes mellitus ([place])    INSULIN DEPENDENT    Patient Active Problem List   Diagnosis Date Noted  . GI bleed 10/05/2018  . Localized edema due to fluid overload 09/05/2017  . Lower GI bleed   . Evaluation by psychiatric service required 07/17/2017  . Volume overload 06/03/2017  . Right supracondylar humerus fracture, with nonunion, subsequent encounter 05/01/2017  . Malnutrition of moderate degree 02/28/2017  . Leg pain, left 02/25/2017  . Left leg pain 02/25/2017  . External hemorrhoids   . Diverticulosis of colon without hemorrhage   . Goals of care, counseling/discussion   . Palliative care encounter   . Cellulitis of left leg 01/16/2017  . Diabetes mellitus with complication ([place]) 68/61/6837  . Upper GI bleed 01/16/2017  . UGIB (upper gastrointestinal bleed)   . Anemia due to chronic kidney disease   . Cellulitis of left thigh 01/01/2017  . ESRD on dialysis ([place]) 01/01/2017  . Cocaine use disorder, severe, dependence ([place]) 08/29/2016  . Hepatitis C 08/29/2016  . Diabetic ulcer of calf ([place]) 08/29/2016  . Diabetic ulcer of toe ([place]) 08/29/2016  .  Cerebrovascular accident (CVA) ([place]) 08/29/2016  . MDD (major depressive disorder) 08/28/2016  . Dyslipidemia associated with type 2 diabetes mellitus ([place]) 11/10/2015  . Hyperkalemia 09/17/2015  . History of vitamin D deficiency 06/20/2015  . Long term prescription opiate use 06/20/2015  . Chronic pain syndrome 06/19/2015  . Anemia of chronic kidney failure 05/01/2015  . transmetatarsal amputation of the left foot 12/13/2014  . Unilateral visual loss 11/22/2014  . 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 ([place]) 11/22/2014  . Essential hypertension 11/09/2014  . Dizziness   . Chronic combined systolic (congestive) and diastolic (congestive) heart  failure ([place])   . Rectal bleeding 04/23/2014  . Anemia, iron deficiency 04/13/2014  . Noncompliance 02/02/2014  . Hyponatremia 05/19/2013  . Seizure disorder ([place]) 02/09/2013    Past Surgical History:  Procedure Laterality Date  . ABDOMINAL HYSTERECTOMY  2014  . ABDOMINAL HYSTERECTOMY  2010  . AMPUTATION Left 05/21/2013   Procedure: Left Midfoot AMPUTATION;  Surgeon: Newt Minion, MD;  Location: [facility];  Service: Orthopedics;  Laterality: Left;  Left Midfoot Amputation  . AV FISTULA PLACEMENT Left 05/04/2015   Procedure: ARTERIOVENOUS (AV) GRAFT INSERTION;  Surgeon: Angelia Mould, MD;  Location: [facility];  Service: Vascular;  Laterality: Left;  . COLONOSCOPY WITH PROPOFOL N/A 06/22/2013   Procedure: COLONOSCOPY WITH PROPOFOL;  Surgeon: Jeryl Columbia, MD;  Location: [facility] ENDOSCOPY;  Service: Endoscopy;  Laterality: N/A;  . ESOPHAGOGASTRODUODENOSCOPY N/A 05/11/2015   Procedure: ESOPHAGOGASTRODUODENOSCOPY (EGD);  Surgeon: Laurence Spates, MD;  Location: [facility] ENDOSCOPY;  Service: Endoscopy;  Laterality: N/A;  . EYE SURGERY Bilateral 2010   Lasik  . FLEXIBLE SIGMOIDOSCOPY N/A 01/17/2017   Procedure: FLEXIBLE SIGMOIDOSCOPY w/ hemorrhoid banding possible;  Surgeon: Gatha Mayer, MD;  Location[facility]COPY;  Service: Endoscopy;  Laterality: N/A;  . FOOT AMPUTATION Left    due diabetic neuropathy, could not feel ulcer on bottom of foot  . REFRACTIVE SURGERY Bilateral 2013  . TONSILLECTOMY  1970's  . TUBAL LIGATION  2000     OB History    Gravida  0   Para  0   Term  0   Preterm  0   AB  0   Living        SAB  0   TAB  0   Ectopic  0   Multiple      Live Births               Home Medications    Prior to Admission medications   Medication Sig Start Date End Date Taking? Authorizing Provider  albuterol (PROVENTIL HFA;VENTOLIN HFA) 108 (90 Base) MCG/ACT inhaler Inhale 2 puffs into the lungs every 4 (four) hours as needed for wheezing or shortness of breath. 10/15/16   Yes Horton, Barbette Hair, MD  albuterol (PROVENTIL) (2.5 MG/3ML) 0.083% nebulizer solution Take 2.5 mg by nebulization every 6 (six) hours as needed for wheezing or shortness of breath.   Yes [provider]  allopurinol (ZYLOPRIM) 100 MG tablet Take 1 tablet (100 mg total) by mouth every Tuesday, Thursday, and Saturday at 6 PM. 01/25/17  Yes Nita Sells, MD  amLODipine (NORVASC) 10 MG tablet Take 1 tablet (10 mg total) by mouth at bedtime. 09/02/16  Yes Hildred Priest, MD  atorvastatin (LIPITOR) 40 MG tablet Take 1 tablet (40 mg total) by mouth at bedtime. 09/02/16  Yes Hildred Priest, MD  benzocaine (ORAJEL) 10 % mucosal gel Use as directed 1 application in the mouth or throat 2 (  two) times daily as needed for mouth pain.   Yes [provider]  bisacodyl (DULCOLAX) 10 MG suppository Place 10 mg rectally daily as needed (for constipation).   Yes [provider]  calcium acetate (PHOSLO) 667 MG capsule Take 2 capsules (1,334 mg total) by mouth 3 (three) times daily with meals. 09/02/16  Yes Hildred Priest, MD  camphor-menthol [facility]) lotion Apply 1 application topically as needed for itching.   Yes [provider]  carvedilol (COREG) 25 MG tablet Take 1 tablet (25 mg total) by mouth 2 (two) times daily with a meal. 09/02/16  Yes Hildred Priest, MD  cloNIDine (CATAPRES) 0.1 MG tablet Take 1 tablet (0.1 mg total) by mouth every 8 (eight) hours. Patient taking differently: Take 0.1 mg by mouth every 8 (eight) hours. Hold if SBP is less tha 100 or DBP is less than 60 for hypertension 09/02/16  Yes Hildred Priest, MD  diphenhydrAMINE (BENADRYL) 25 mg capsule Take 25 mg by mouth every 6 (six) hours as needed for itching.    Yes [provider]  docusate sodium (COLACE) 100 MG capsule Take 100 mg by mouth 2 (two) times daily.   Yes [provider]  DULoxetine (CYMBALTA) 30 MG capsule Take 90 mg  by mouth daily. Take with 60mg  to equal 90mg  once daily   Yes [provider]  DULoxetine (CYMBALTA) 60 MG capsule Take 90 mg by mouth daily. Take with 30mg  to equal 90mg  once daily    Yes [provider]  Ferrous Sulfate (SLOW FE) 142 (45 Fe) MG TBCR Take 1 tablet by mouth daily. 07/19/17  Yes Mariel Aloe, MD  hydrocortisone (ANUSOL-HC) 2.5 % rectal cream Place rectally 2 (two) times daily. Patient taking differently: Place 1 application rectally 2 (two) times daily as needed for hemorrhoids.  01/24/17  Yes Nita Sells, MD  insulin aspart (NOVOLOG) 100 UNIT/ML injection Inject 7 Units into the skin 3 (three) times daily with meals. 06/07/17  Yes Helberg, Larkin Ina, MD  Insulin Detemir (LEVEMIR FLEXTOUCH) 100 UNIT/ML Pen Inject 20 Units into the skin at bedtime.   Yes [provider]  methocarbamol (ROBAXIN) 500 MG tablet Take 500 mg by mouth 3 (three) times daily as needed for muscle spasms.   Yes [provider]  multivitamin (RENA-VIT) TABS tablet Take 1 tablet by mouth at bedtime. 09/02/16  Yes Hildred Priest, MD  mupirocin ointment (BACTROBAN) 2 % Apply 1 application topically 2 (two) times daily. 09/02/18  Yes Dondra Prader R, NP  ondansetron (ZOFRAN) 4 MG tablet Take 4 mg by mouth every 8 (eight) hours as needed for nausea or vomiting.   Yes [provider]  Oxycodone HCl 10 MG TABS Take 1 tablet (10 mg total) by mouth every 6 (six) hours as needed. Patient taking differently: Take 10 mg by mouth every 6 (six) hours as needed (for pain).  07/19/17  Yes Mariel Aloe, MD  pantoprazole (PROTONIX) 40 MG tablet Take 1 tablet (40 mg total) by mouth daily at 6 (six) AM. 07/20/17  Yes Mariel Aloe, MD  saccharomyces boulardii (FLORASTOR) 250 MG capsule Take 250 mg by mouth daily.   Yes [provider]  senna (SENOKOT) 8.6 MG tablet Take 2 tablets by mouth daily.   Yes [provider]  traZODone (DESYREL) 50 MG tablet  Take 0.5 tablets (25 mg total) by mouth at bedtime. Patient taking differently: Take 50 mg by mouth at bedtime.  07/19/17  Yes Mariel Aloe, MD  insulin glargine (LANTUS) 100 UNIT/ML injection Inject 0.2 mLs (20 Units total) into the skin at bedtime. Patient not taking: Reported on 10/05/2018 06/07/17   Ina Homes, MD  LORazepam (ATIVAN) 0.5 MG tablet Take 1 tablet (0.5 mg total) by mouth every 8 (eight) hours as needed for anxiety. Patient not taking: Reported on 10/05/2018 07/19/17   Mariel Aloe, MD  Nutritional Supplements (FEEDING SUPPLEMENT, NEPRO CARB STEADY,) LIQD Take 237 mLs by mouth 3 (three) times daily between meals. Patient not taking: Reported on 09/09/2017 02/28/17   Cristal Ford, DO  sertraline (ZOLOFT) 50 MG tablet Take 1 tablet (50 mg total) by mouth daily. Patient not taking: Reported on 09/09/2017 07/19/17   Mariel Aloe, MD    Family History Family History  Problem Relation Age of Onset  . Hypertension Mother   . Diabetes Mother   . Hypertension Father   . Diabetes Father     Social History Social History   Tobacco Use  . Smoking status: Current Some Day Smoker    Packs/day: 0.10    Years: 30.00    Pack years: 3.00    Types: Cigarettes    Last attempt to quit: 03/31/2015    Years since quitting: 3.5  . Smokeless tobacco: Never Used  Substance Use Topics  . Alcohol use: Yes    Alcohol/week: 3.0 standard drinks    Types: 3 Standard drinks or equivalent per week    Frequency: Never    Comment: last week  . Drug use: Yes    Types: "Crack" cocaine, Cocaine    Comment: LAST USE YESTERDAY     Allergies   Penicillins; Gabapentin; Lyrica [pregabalin]; Saphris [asenapine]; Tramadol; Adhesive [tape]; Latex; and Other   Review of Systems Review of Systems  Constitutional: Negative for fever.  HENT: Negative for nosebleeds.   Respiratory: Negative for shortness of breath.   Cardiovascular: Negative for chest pain.  Gastrointestinal: Positive for  anal bleeding, blood in stool and hematochezia. Negative for hematemesis and vomiting.  All other systems reviewed and are negative.    Physical Exam Updated Vital Signs BP 130/61   Pulse 93   Temp 97.8 F (36.6 C) (Oral)   Resp 17   Ht 5\' 4"  (1.626 m)   Wt 77.1 kg   LMP  (LMP Unknown)   SpO2 100%   BMI 29.18 kg/m   Physical Exam Vitals signs and nursing note reviewed.  Constitutional:      General: She is not in acute distress.    Appearance: She is obese.  HENT:     Head: Normocephalic and atraumatic.     Nose: Nose normal.     Mouth/Throat:     Mouth: Mucous membranes are moist.     Pharynx: Oropharynx is clear.  Eyes:     Conjunctiva/sclera: Conjunctivae normal.     Pupils: Pupils are equal, round, and reactive to light.  Neck:     Musculoskeletal: Normal range of motion and neck supple.  Cardiovascular:     Rate and Rhythm: Normal rate and regular rhythm.     Pulses: Normal pulses.     Heart sounds: Normal heart sounds.  Pulmonary:     Effort: Pulmonary effort is normal.     Breath sounds: Normal breath sounds.  Abdominal:     General: Abdomen is flat. Bowel sounds are normal.     Palpations: Abdomen is soft.     Tenderness: There is no abdominal tenderness. There is no guarding.  Genitourinary:  Rectum: Guaiac result positive.     Comments: Gross blood Musculoskeletal: Normal range of motion.  Skin:    General: Skin is warm and dry.     Capillary Refill: Capillary refill takes less than 2 seconds.  Neurological:     General: No focal deficit present.     Mental Status: She is alert and oriented to person, place, and time.     Deep Tendon Reflexes: Reflexes normal.  Psychiatric:        Mood and Affect: Mood normal.        Behavior: Behavior normal.      ED Treatments / Results  Labs (all labs ordered are listed, but only abnormal results are displayed) Results for orders placed or performed during the hospital encounter of 10/05/18  CBC with  Differential/Platelet  Result Value Ref Range   WBC 5.3 4.0 - 10.5 K/uL   RBC 1.49 (L) 3.87 - 5.11 MIL/uL   Hemoglobin 4.6 (LL) 12.0 - 15.0 g/dL   HCT 14.8 (L) 36.0 - 46.0 %   MCV 99.3 80.0 - 100.0 fL   MCH 30.9 26.0 - 34.0 pg   MCHC 31.1 30.0 - 36.0 g/dL   RDW 16.6 (H) 11.5 - 15.5 %   Platelets 155 150 - 400 K/uL   nRBC 0.0 0.0 - 0.2 %   Neutrophils Relative % 67 %   Lymphocytes Relative 22 %   Monocytes Relative 8 %   Eosinophils Relative 3 %   Basophils Relative 0 %   Band Neutrophils 0 %   Metamyelocytes Relative 0 %   Myelocytes 0 %   Promyelocytes Relative 0 %   Blasts 0 %   nRBC 0 0 /100 WBC   Other 0 %   Neutro Abs 3.5 1.7 - 7.7 K/uL   Lymphs Abs 1.2 0.7 - 4.0 K/uL   Monocytes Absolute 0.4 0.1 - 1.0 K/uL   Eosinophils Absolute 0.2 0.0 - 0.5 K/uL   Basophils Absolute 0.0 0.0 - 0.1 K/uL  Comprehensive metabolic panel  Result Value Ref Range   Sodium 134 (L) 135 - 145 mmol/L   Potassium 6.6 (HH) 3.5 - 5.1 mmol/L   Chloride 93 (L) 98 - 111 mmol/L   CO2 26 22 - 32 mmol/L   Glucose, Bld 95 70 - 99 mg/dL   BUN 128 (H) 6 - 20 mg/dL   Creatinine, Ser 9.77 (H) 0.44 - 1.00 mg/dL   Calcium 8.2 (L) 8.9 - 10.3 mg/dL   Total Protein 6.6 6.5 - 8.1 g/dL   Albumin 3.0 (L) 3.5 - 5.0 g/dL   AST 46 (H) 15 - 41 U/L   ALT 50 (H) 0 - 44 U/L   Alkaline Phosphatase 125 38 - 126 U/L   Total Bilirubin 0.8 0.3 - 1.2 mg/dL   GFR calc non Af Amer 4 (L) >60 mL/min   GFR calc Af Amer 5 (L) >60 mL/min   Anion gap 15 5 - 15  Protime-INR  Result Value Ref Range   Prothrombin Time 16.6 (H) 11.4 - 15.2 seconds   INR 1.4 (H) 0.8 - 1.2  Basic metabolic panel  Result Value Ref Range   Sodium 136 135 - 145 mmol/L   Potassium 6.7 (HH) 3.5 - 5.1 mmol/L   Chloride 96 (L) 98 - 111 mmol/L   CO2 23 22 - 32 mmol/L   Glucose, Bld 136 (H) 70 - 99 mg/dL   BUN 134 (H) 6 - 20 mg/dL   Creatinine, Ser 9.75 (  H) 0.44 - 1.00 mg/dL   Calcium 9.2 8.9 - 10.3 mg/dL   GFR calc non Af Amer 4 (L) >60 mL/min    GFR calc Af Amer 5 (L) >60 mL/min   Anion gap 17 (H) 5 - 15  CBC  Result Value Ref Range   WBC 4.0 4.0 - 10.5 K/uL   RBC 1.44 (L) 3.87 - 5.11 MIL/uL   Hemoglobin 4.6 (LL) 12.0 - 15.0 g/dL   HCT 14.1 (L) 36.0 - 46.0 %   MCV 97.9 80.0 - 100.0 fL   MCH 31.9 26.0 - 34.0 pg   MCHC 32.6 30.0 - 36.0 g/dL   RDW 16.7 (H) 11.5 - 15.5 %   Platelets 129 (L) 150 - 400 K/uL   nRBC 0.0 0.0 - 0.2 %  Troponin I - Now Then Q6H  Result Value Ref Range   Troponin I 0.04 (HH) <0.03 ng/mL  POC occult blood, ED Provider will collect  Result Value Ref Range   Fecal Occult Bld POSITIVE (A) NEGATIVE  I-stat troponin, ED  Result Value Ref Range   Troponin i, poc 0.03 0.00 - 0.08 ng/mL   Comment 3          CBG monitoring, ED  Result Value Ref Range   Glucose-Capillary 127 (H) 70 - 99 mg/dL   Dg Abd Acute 2+v W 1v Chest  Result Date: 10/05/2018 CLINICAL DATA:  Missed dialysis.  GI bleed. EXAM: DG ABDOMEN ACUTE W/ 1V CHEST COMPARISON:  Chest radiographs 03/16/2018 FINDINGS: Chronic cardiomegaly with slight improvement from prior. Chronic vascular congestion. Improved pulmonary edema. No significant pleural fluid. No bowel dilatation to suggest obstruction. Air-filled transverse colon with air-fluid levels, formed stool in the more proximal and distal colon. No evidence of free air. Advanced vascular calcifications, no radiopaque calculi. No evidence of acute osseous abnormality. IMPRESSION: 1. No free air or bowel obstruction. Mild gaseous distension of transverse colon with air-fluid levels, formed stool proximal and distally. 2. Chronic cardiomegaly and vascular congestion. Electronically Signed   By: Keith Rake M.D.   On: 10/05/2018 03:02    EKG EKG Interpretation  Date/Time:  Monday October 05 2018 02:24:58 EST Ventricular Rate:  92 PR Interval:    QRS Duration: 117 QT Interval:  399 QTC Calculation: 494 R Axis:   93 Text Interpretation:  Sinus rhythm Left ventricular hypertrophy Abnormal T,  consider ischemia, lateral leads peaked Ts of hyperkalemia Confirmed by Randal Buba, Chizara Mena ([id number]) on 10/05/2018 2:57:46 AM   Radiology Dg Abd Acute 2+v W 1v Chest  Result Date: 10/05/2018 CLINICAL DATA:  Missed dialysis.  GI bleed. EXAM: DG ABDOMEN ACUTE W/ 1V CHEST COMPARISON:  Chest radiographs 03/16/2018 FINDINGS: Chronic cardiomegaly with slight improvement from prior. Chronic vascular congestion. Improved pulmonary edema. No significant pleural fluid. No bowel dilatation to suggest obstruction. Air-filled transverse colon with air-fluid levels, formed stool in the more proximal and distal colon. No evidence of free air. Advanced vascular calcifications, no radiopaque calculi. No evidence of acute osseous abnormality. IMPRESSION: 1. No free air or bowel obstruction. Mild gaseous distension of transverse colon with air-fluid levels, formed stool proximal and distally. 2. Chronic cardiomegaly and vascular congestion. Electronically Signed   By: Keith Rake M.D.   On: 10/05/2018 03:02    Procedures Procedures (including critical care time)  Medications Ordered in ED Medications  insulin aspart (novoLOG) injection 0-9 Units (has no administration in time range)  sodium chloride 0.9 % bolus 250 mL (has no administration in time range)  acetaminophen (  TYLENOL) tablet 650 mg (has no administration in time range)  calcium gluconate 1 g/ 50 mL sodium chloride IVPB (0 g Intravenous Stopped 10/05/18 0410)  albuterol (PROVENTIL) (2.5 MG/3ML) 0.083% nebulizer solution 10 mg (10 mg Nebulization Given 10/05/18 0336)  insulin aspart (novoLOG) injection 10 Units (10 Units Intravenous Given 10/05/18 0335)    And  dextrose 50 % solution 50 mL (50 mLs Intravenous Given 10/05/18 0336)  sodium bicarbonate injection 50 mEq (50 mEq Intravenous Given 10/05/18 0336)  sodium zirconium cyclosilicate (LOKELMA) packet 10 g (10 g Oral Given 10/05/18 0335)   MDM Reviewed: previous chart, nursing note and vitals Interpretation:  labs, ECG and x-ray (no PNA or free air on CXR,  negative troponin hemoglobin 4.6 is down from 10.2 baseline,  hyperkalemia with EKG changes) Total time providing critical care: > 105 minutes (consults and treatment of hyperkalemia). This excludes time spent performing separately reportable procedures and services. Consults: admitting MD and gastrointestinal (Dr. Justin Mend, will dialyze in am.  Dr. Michail Sermon will follow along)  CRITICAL CARE Performed by: Macklyn Glandon K Ronit Cranfield-Rasch Total critical care time: 120 minutes Critical care time was exclusive of separately billable procedures and treating other patients. Critical care was necessary to treat or prevent imminent or life-threatening deterioration. Critical care was time spent personally by me on the following activities: development of treatment plan with patient and/or surrogate as well as nursing, discussions with consultants, evaluation of patient's response to treatment, examination of patient, obtaining history from patient or surrogate, ordering and performing treatments and interventions, ordering and review of laboratory studies, ordering and review of radiographic studies, pulse oximetry and re-evaluation of patient's condition. Final Clinical Impressions(s) / ED Diagnoses   Final diagnoses:  Lower GI bleed  Hyperkalemia  Noncompliance  Abnormal EKG    Will admit to medicine service.  Patient is refusing blood secondary to being a Jehovah's witness    Quaron Delacruz, MD 10/05/18 0600

## 2018-10-05 NOTE — Consult Note (Addendum)
Renal Service Consult Note [facility]  SABA GOMM 10/05/2018 Sol Blazing Requesting Physician: Dr Starla Link  Reason for Consult:  ESRD pt /w GIB HPI: The patient is a 53 y.o. year-old with hx of DM2, TIA/ CVA, schizoaffective d/o, Jehovah's Witness, anemia of CKD< HL, ESRD on HD, chronic pain, combined CHF and bipolar d/o, presented to hospital for bloody stool.  Going on for 2 days, some low abd discomfort. Taking ibuprofen OTC for foot pain prn.  Missed HD on Sat. IN ED Hb is 4.6 and K+ is 6.7.  BUN 134 and Cr 9.75.  BP's wnl, 130/80, HR 80's. Orthostatics negative, afeb and RR 18.  Asked to see for ESRD.     Pt reports feeling "very weak" and tired. On HD now, no CP or SOB.  No cough, leg swelling or fevers.   ROS  denies CP  no joint pain   no HA  no blurry vision  no rash  no diarrhea  no nausea/ vomiting   Past Medical History  Past Medical History:  Diagnosis Date  . Anemia   . Anxiety   . Bipolar disorder, unspecified ([place]) 02/06/2014  . Chronic combined systolic and diastolic CHF (congestive heart failure) (HCC)    EF 40% by echo and 48% by stress test  . Chronic pain syndrome   . Depression   . ESRD on hemodialysis ([place])   . GERD (gastroesophageal reflux disease)   . Glaucoma   . Headache    sinus headaches  . Hepatitis C   . High cholesterol   . History of hiatal hernia   . History of vitamin D deficiency 06/20/2015  . Hypertension   . MRSA infection ?2014   "on the back of my head; spread throughout my bloodstream"  . Neuropathy   . Refusal of blood transfusions as patient is Jehovah's Witness   . Schizoaffective disorder, unspecified condition 02/08/2014  . Seizure [facility] dx'd 2014   "don't know what kind; last one was ~ 04/2014"  . Stroke [facility])    TIA 2009  . TIA (transient ischemic attack) 2010  . Type II diabetes mellitus ([place])    INSULIN DEPENDENT   Past Surgical History  Past Surgical History:  Procedure Laterality Date  .  ABDOMINAL HYSTERECTOMY  2014  . ABDOMINAL HYSTERECTOMY  2010  . AMPUTATION Left 05/21/2013   Procedure: Left Midfoot AMPUTATION;  Surgeon: Newt Minion, MD;  Location: [place];  Service: Orthopedics;  Laterality: Left;  Left Midfoot Amputation  . AV FISTULA PLACEMENT Left 05/04/2015   Procedure: ARTERIOVENOUS (AV) GRAFT INSERTION;  Surgeon: Angelia Mould, MD;  Location: [place];  Service: Vascular;  Laterality: Left;  . COLONOSCOPY WITH PROPOFOL N/A 06/22/2013   Procedure: COLONOSCOPY WITH PROPOFOL;  Surgeon: Jeryl Columbia, MD;  Location: [facility] ENDOSCOPY;  Service: Endoscopy;  Laterality: N/A;  . ESOPHAGOGASTRODUODENOSCOPY N/A 05/11/2015   Procedure: ESOPHAGOGASTRODUODENOSCOPY (EGD);  Surgeon: Laurence Spates, MD;  Location: [facility] ENDOSCOPY;  Service: Endoscopy;  Laterality: N/A;  . EYE SURGERY Bilateral 2010   Lasik  . FLEXIBLE SIGMOIDOSCOPY N/A 01/17/2017   Procedure: FLEXIBLE SIGMOIDOSCOPY w/ hemorrhoid banding possible;  Surgeon: Gatha Mayer, MD;  Location: [facility] ENDOSCOPY;  Service: Endoscopy;  Laterality: N/A;  . FOOT AMPUTATION Left    due diabetic neuropathy, could not feel ulcer on bottom of foot  . REFRACTIVE SURGERY Bilateral 2013  . TONSILLECTOMY  1970's  . TUBAL LIGATION  2000   Family History  Family History  Problem Relation Age of Onset  . Hypertension Mother   . Diabetes Mother   . Hypertension Father   . Diabetes Father    Social History  reports that she has been smoking cigarettes. She has a 3.00 pack-year smoking history. She has never used smokeless tobacco. She reports current alcohol use of about 3.0 standard drinks of alcohol per week. She reports current drug use. Drugs: "Crack" cocaine and Cocaine. Allergies  Allergies  Allergen Reactions  . Penicillins Hives and Other (See Comments)    Has patient had a PCN reaction causing immediate rash, facial/tongue/throat swelling, SOB or lightheadedness with hypotension: Yes Has patient had a PCN reaction causing severe  rash involving mucus membranes or skin necrosis: No Has patient had a PCN reaction that required hospitalization No Has patient had a PCN reaction occurring within the last 10 years: No If all of the above answers are "NO", then may proceed with Cephalosporin use. Pt tolerates cefepime and ceftriaxone  . Gabapentin Itching and Swelling  . Lyrica [Pregabalin] Swelling and Other (See Comments)    Facial and leg swelling  . Saphris [Asenapine] Swelling and Other (See Comments)    Facial swelling  . Tramadol Swelling and Other (See Comments)    Leg swelling  . Adhesive [Tape] Itching    SEVERE ITCHING  . Latex Itching    SEVERE ITCHING  . Other Other (See Comments)    NO BLOOD PRODUCTS; PATIENT IS A JEHOVAH'S WITNESS   Home medications Prior to Admission medications   Medication Sig Start Date End Date Taking? Authorizing Provider  albuterol (PROVENTIL HFA;VENTOLIN HFA) 108 (90 Base) MCG/ACT inhaler Inhale 2 puffs into the lungs every 4 (four) hours as needed for wheezing or shortness of breath. 10/15/16  Yes Horton, Barbette Hair, MD  albuterol (PROVENTIL) (2.5 MG/3ML) 0.083% nebulizer solution Take 2.5 mg by nebulization every 6 (six) hours as needed for wheezing or shortness of breath.   Yes [provider]  allopurinol (ZYLOPRIM) 100 MG tablet Take 1 tablet (100 mg total) by mouth every Tuesday, Thursday, and Saturday at 6 PM. 01/25/17  Yes Nita Sells, MD  amLODipine (NORVASC) 10 MG tablet Take 1 tablet (10 mg total) by mouth at bedtime. 09/02/16  Yes Hildred Priest, MD  atorvastatin (LIPITOR) 40 MG tablet Take 1 tablet (40 mg total) by mouth at bedtime. 09/02/16  Yes Hildred Priest, MD  benzocaine (ORAJEL) 10 % mucosal gel Use as directed 1 application in the mouth or throat 2 (two) times daily as needed for mouth pain.   Yes [provider]  bisacodyl (DULCOLAX) 10 MG suppository Place 10 mg rectally daily as needed (for constipation).   Yes  [provider]  calcium acetate (PHOSLO) 667 MG capsule Take 2 capsules (1,334 mg total) by mouth 3 (three) times daily with meals. 09/02/16  Yes Hildred Priest, MD  camphor-menthol [facility]) lotion Apply 1 application topically as needed for itching.   Yes [provider]  carvedilol (COREG) 25 MG tablet Take 1 tablet (25 mg total) by mouth 2 (two) times daily with a meal. 09/02/16  Yes Hildred Priest, MD  cloNIDine (CATAPRES) 0.1 MG tablet Take 1 tablet (0.1 mg total) by mouth every 8 (eight) hours. Patient taking differently: Take 0.1 mg by mouth every 8 (eight) hours. Hold if SBP is less tha 100 or DBP is less than 60 for hypertension 09/02/16  Yes Hildred Priest, MD  diphenhydrAMINE (BENADRYL) 25 mg capsule Take 25 mg by mouth every 6 (six)  hours as needed for itching.    Yes [provider]  docusate sodium (COLACE) 100 MG capsule Take 100 mg by mouth 2 (two) times daily.   Yes [provider]  DULoxetine (CYMBALTA) 30 MG capsule Take 90 mg by mouth daily. Take with 60mg  to equal 90mg  once daily   Yes [provider]  DULoxetine (CYMBALTA) 60 MG capsule Take 90 mg by mouth daily. Take with 30mg  to equal 90mg  once daily    Yes [provider]  Ferrous Sulfate (SLOW FE) 142 (45 Fe) MG TBCR Take 1 tablet by mouth daily. 07/19/17  Yes Mariel Aloe, MD  hydrocortisone (ANUSOL-HC) 2.5 % rectal cream Place rectally 2 (two) times daily. Patient taking differently: Place 1 application rectally 2 (two) times daily as needed for hemorrhoids.  01/24/17  Yes Nita Sells, MD  insulin aspart (NOVOLOG) 100 UNIT/ML injection Inject 7 Units into the skin 3 (three) times daily with meals. 06/07/17  Yes Helberg, Larkin Ina, MD  Insulin Detemir (LEVEMIR FLEXTOUCH) 100 UNIT/ML Pen Inject 20 Units into the skin at bedtime.   Yes [provider]  methocarbamol (ROBAXIN) 500 MG tablet Take 500 mg by mouth 3 (three) times  daily as needed for muscle spasms.   Yes [provider]  multivitamin (RENA-VIT) TABS tablet Take 1 tablet by mouth at bedtime. 09/02/16  Yes Hildred Priest, MD  mupirocin ointment (BACTROBAN) 2 % Apply 1 application topically 2 (two) times daily. 09/02/18  Yes Dondra Prader R, NP  ondansetron (ZOFRAN) 4 MG tablet Take 4 mg by mouth every 8 (eight) hours as needed for nausea or vomiting.   Yes [provider]  Oxycodone HCl 10 MG TABS Take 1 tablet (10 mg total) by mouth every 6 (six) hours as needed. Patient taking differently: Take 10 mg by mouth every 6 (six) hours as needed (for pain).  07/19/17  Yes Mariel Aloe, MD  pantoprazole (PROTONIX) 40 MG tablet Take 1 tablet (40 mg total) by mouth daily at 6 (six) AM. 07/20/17  Yes Mariel Aloe, MD  saccharomyces boulardii (FLORASTOR) 250 MG capsule Take 250 mg by mouth daily.   Yes [provider]  senna (SENOKOT) 8.6 MG tablet Take 2 tablets by mouth daily.   Yes [provider]  traZODone (DESYREL) 50 MG tablet Take 0.5 tablets (25 mg total) by mouth at bedtime. Patient taking differently: Take 50 mg by mouth at bedtime.  07/19/17  Yes Mariel Aloe, MD  insulin glargine (LANTUS) 100 UNIT/ML injection Inject 0.2 mLs (20 Units total) into the skin at bedtime. Patient not taking: Reported on 10/05/2018 06/07/17   Ina Homes, MD  LORazepam (ATIVAN) 0.5 MG tablet Take 1 tablet (0.5 mg total) by mouth every 8 (eight) hours as needed for anxiety. Patient not taking: Reported on 10/05/2018 07/19/17   Mariel Aloe, MD  Nutritional Supplements (FEEDING SUPPLEMENT, NEPRO CARB STEADY,) LIQD Take 237 mLs by mouth 3 (three) times daily between meals. Patient not taking: Reported on 09/09/2017 02/28/17   Cristal Ford, DO  sertraline (ZOLOFT) 50 MG tablet Take 1 tablet (50 mg total) by mouth daily. Patient not taking: Reported on 09/09/2017 07/19/17   Mariel Aloe, MD   Liver Function Tests Recent Labs   Lab 10/05/18 0155  AST 46*  ALT 50*  ALKPHOS 125  BILITOT 0.8  PROT 6.6  ALBUMIN 3.0*   No results for input(s): LIPASE, AMYLASE in the last 168 hours. CBC Recent Labs  Lab 10/05/18  0155 10/05/18 0351  WBC 5.3 4.0  NEUTROABS 3.5  --   HGB 4.6* 4.6*  HCT 14.8* 14.1*  MCV 99.3 97.9  PLT 155 364*   Basic Metabolic Panel Recent Labs  Lab 10/05/18 0155 10/05/18 0351  NA 134* 136  K 6.6* 6.7*  CL 93* 96*  CO2 26 23  GLUCOSE 95 136*  BUN 128* 134*  CREATININE 9.77* 9.75*  CALCIUM 8.2* 9.2   Iron/TIBC/Ferritin/ %Sat    Component Value Date/Time   IRON 96 07/16/2017 2315   TIBC 253 07/16/2017 2315   FERRITIN 3,123 (H) 07/16/2017 2315   IRONPCTSAT 38 (H) 07/16/2017 2315    Vitals:   10/05/18 0714 10/05/18 0719 10/05/18 0730 10/05/18 0800  BP: (!) 111/56  (!) 104/57 (!) 93/50  Pulse: 86 92 93 92  Resp: 10 14 15 15   Temp:      TempSrc:      SpO2:      Weight:      Height:       Exam Gen very pale eyelids, weak, no distress, on HD No rash, cyanosis or gangrene Sclera anicteric, throat clear  No jvd or bruits Chest clear bilat to bases RRR no MRG Abd soft ntnd no mass or ascites +bs GU defer MS no joint effusions or deformity Ext trace LE edema, no wounds or ulcers Neuro is alert, Ox 3 , nf LUE AVG+bruit    Home meds:  - amlodipine 10/ carvedilol 25 bid/ clonidine 0.1 tid  - insulin detemir 20 u qd/ insulin aspart 7u tid  - trazodone 25mg  hs/ sertraline 50 qd/ oxycodone 10mg  prn/ duloxetine 90 qd  - allopurinol 100 tts/ atorvastatin 40/ calc acetate 2 ac tid/ pantoprazole 40 qm  - prn's / vitamins   TTS G/O  3.5h  2/2 bath  75kg   LUE AVG   Hep 7000    - mircera 50 every 2 wks  - calcitriol 1.75ug tiw  - venofer 50 /wk  - last Hb 10.0 on 2/27     Assessment: 1. GI bleed- per primary, Hb very low, pt does not take transfusions. GI consulting.  2. Hyperkalemia - will get HD this am 3. ESRD - ^BUN/ cr, needs HD this am 4. Volume - up  several kg by bed wts but not tolerating UF on HD much. No distress. Get stand wt if possible post HD>  5. DM2 6. Severe anemia: pt refuses blood transfusions , she is Jehovah's Witness. She has already received high-dose darbepoeitin 200 ug today, this should be repeated weekly.  Will cancel EPO order.    P: 1. AS above.       [facility] 10/05/2018, 9:07 AM

## 2018-10-05 NOTE — ED Triage Notes (Signed)
Per [facility], pt from [facility] w/ a c/o a lower GI bleed that began yesterday. Pt has been passing large, dark clots since yesterday and feels like she passed another during EMS transport. Additional complaints of nausea, headache, and lower abd pain. Pt goes to dialysis on T/TH/Sat but missed her tx yesterday.   CAOx4 180/90 HR 90 CBG 156 100%

## 2018-10-05 NOTE — ED Notes (Signed)
Patient transported to X-ray 

## 2018-10-05 NOTE — Consult Note (Signed)
[facility] Consultation Note  Referring Provider: [facility] Primary Care Physician:  Patient, [name]  Reason for Consultation:  Anemia, blood in stool  HPI: [name] is a 53 y.o. female admitted for above reasons.  She has multiple medical problems and has had bleeding and anemia evaluation with sigmoidoscopy, colonoscopy, and endoscopy, over the years.  Last episode of bleeding earlier this morning.  Jehovah's witness; won't accept blood; Hgb < 5.   Past Medical History:  Diagnosis Date  . Anemia   . Anxiety   . Bipolar disorder, unspecified ([place]) 02/06/2014  . Chronic combined systolic and diastolic CHF (congestive heart failure) (HCC)    EF 40% by echo and 48% by stress test  . Chronic pain syndrome   . Depression   . ESRD on hemodialysis ([place])   . GERD (gastroesophageal reflux disease)   . Glaucoma   . Headache    sinus headaches  . Hepatitis C   . High cholesterol   . History of hiatal hernia   . History of vitamin D deficiency 06/20/2015  . Hypertension   . MRSA infection ?2014   "on the back of my head; spread throughout my bloodstream"  . Neuropathy   . Refusal of blood transfusions as patient is Jehovah's Witness   . Schizoaffective disorder, unspecified condition 02/08/2014  . Seizure [facility]) dx'd 2014   "don't know what kind; last one was ~ 04/2014"  . Stroke [facility])    TIA 2009  . TIA (transient ischemic attack) 2010  . Type II diabetes mellitus ([place])    INSULIN DEPENDENT    Past Surgical History:  Procedure Laterality Date  . ABDOMINAL HYSTERECTOMY  2014  . ABDOMINAL HYSTERECTOMY  2010  . AMPUTATION Left 05/21/2013   Procedure: Left Midfoot AMPUTATION;  Surgeon: Newt Minion, MD;  Location: [place];  Service: Orthopedics;  Laterality: Left;  Left Midfoot Amputation  . AV FISTULA PLACEMENT Left 05/04/2015   Procedure: ARTERIOVENOUS (AV) GRAFT INSERTION;  Surgeon: Angelia Mould, MD;  Location: [place];  Service: Vascular;   Laterality: Left;  . COLONOSCOPY WITH PROPOFOL N/A 06/22/2013   Procedure: COLONOSCOPY WITH PROPOFOL;  Surgeon: Jeryl Columbia, MD;  Location: [facility] ENDOSCOPY;  Service: Endoscopy;  Laterality: N/A;  . ESOPHAGOGASTRODUODENOSCOPY N/A 05/11/2015   Procedure: ESOPHAGOGASTRODUODENOSCOPY (EGD);  Surgeon: Laurence Spates, MD;  Location: [facility] ENDOSCOPY;  Service: Endoscopy;  Laterality: N/A;  . EYE SURGERY Bilateral 2010   Lasik  . FLEXIBLE SIGMOIDOSCOPY N/A 01/17/2017   Procedure: FLEXIBLE SIGMOIDOSCOPY w/ hemorrhoid banding possible;  Surgeon: Gatha Mayer, MD;  Location: [facility] ENDOSCOPY;  Service: Endoscopy;  Laterality: N/A;  . FOOT AMPUTATION Left    due diabetic neuropathy, could not feel ulcer on bottom of foot  . REFRACTIVE SURGERY Bilateral 2013  . TONSILLECTOMY  1970's  . TUBAL LIGATION  2000    Prior to Admission medications   Medication Sig Start Date End Date Taking? Authorizing Provider  albuterol (PROVENTIL HFA;VENTOLIN HFA) 108 (90 Base) MCG/ACT inhaler Inhale 2 puffs into the lungs every 4 (four) hours as needed for wheezing or shortness of breath. 10/15/16  Yes Horton, Barbette Hair, MD  albuterol (PROVENTIL) (2.5 MG/3ML) 0.083% nebulizer solution Take 2.5 mg by nebulization every 6 (six) hours as needed for wheezing or shortness of breath.   Yes [provider]  allopurinol (ZYLOPRIM) 100 MG tablet Take 1 tablet (100 mg total) by mouth every Tuesday, Thursday, and Saturday at 6 PM. 01/25/17  Yes Samtani, Jai-Gurmukh,  MD  amLODipine (NORVASC) 10 MG tablet Take 1 tablet (10 mg total) by mouth at bedtime. 09/02/16  Yes Hildred Priest, MD  atorvastatin (LIPITOR) 40 MG tablet Take 1 tablet (40 mg total) by mouth at bedtime. 09/02/16  Yes Hildred Priest, MD  benzocaine (ORAJEL) 10 % mucosal gel Use as directed 1 application in the mouth or throat 2 (two) times daily as needed for mouth pain.   Yes [provider]  bisacodyl (DULCOLAX) 10 MG suppository Place 10  mg rectally daily as needed (for constipation).   Yes [provider]  calcium acetate (PHOSLO) 667 MG capsule Take 2 capsules (1,334 mg total) by mouth 3 (three) times daily with meals. 09/02/16  Yes Hildred Priest, MD  camphor-menthol [facility]) lotion Apply 1 application topically as needed for itching.   Yes [provider]  carvedilol (COREG) 25 MG tablet Take 1 tablet (25 mg total) by mouth 2 (two) times daily with a meal. 09/02/16  Yes Hildred Priest, MD  cloNIDine (CATAPRES) 0.1 MG tablet Take 1 tablet (0.1 mg total) by mouth every 8 (eight) hours. Patient taking differently: Take 0.1 mg by mouth every 8 (eight) hours. Hold if SBP is less tha 100 or DBP is less than 60 for hypertension 09/02/16  Yes Hildred Priest, MD  diphenhydrAMINE (BENADRYL) 25 mg capsule Take 25 mg by mouth every 6 (six) hours as needed for itching.    Yes [provider]  docusate sodium (COLACE) 100 MG capsule Take 100 mg by mouth 2 (two) times daily.   Yes [provider]  DULoxetine (CYMBALTA) 30 MG capsule Take 90 mg by mouth daily. Take with 60mg  to equal 90mg  once daily   Yes [provider]  DULoxetine (CYMBALTA) 60 MG capsule Take 90 mg by mouth daily. Take with 30mg  to equal 90mg  once daily    Yes [provider]  Ferrous Sulfate (SLOW FE) 142 (45 Fe) MG TBCR Take 1 tablet by mouth daily. 07/19/17  Yes Mariel Aloe, MD  hydrocortisone (ANUSOL-HC) 2.5 % rectal cream Place rectally 2 (two) times daily. Patient taking differently: Place 1 application rectally 2 (two) times daily as needed for hemorrhoids.  01/24/17  Yes Nita Sells, MD  insulin aspart (NOVOLOG) 100 UNIT/ML injection Inject 7 Units into the skin 3 (three) times daily with meals. 06/07/17  Yes Helberg, Larkin Ina, MD  Insulin Detemir (LEVEMIR FLEXTOUCH) 100 UNIT/ML Pen Inject 20 Units into the skin at bedtime.   Yes [provider]  methocarbamol  (ROBAXIN) 500 MG tablet Take 500 mg by mouth 3 (three) times daily as needed for muscle spasms.   Yes [provider]  multivitamin (RENA-VIT) TABS tablet Take 1 tablet by mouth at bedtime. 09/02/16  Yes Hildred Priest, MD  mupirocin ointment (BACTROBAN) 2 % Apply 1 application topically 2 (two) times daily. 09/02/18  Yes Dondra Prader R, NP  ondansetron (ZOFRAN) 4 MG tablet Take 4 mg by mouth every 8 (eight) hours as needed for nausea or vomiting.   Yes [provider]  Oxycodone HCl 10 MG TABS Take 1 tablet (10 mg total) by mouth every 6 (six) hours as needed. Patient taking differently: Take 10 mg by mouth every 6 (six) hours as needed (for pain).  07/19/17  Yes Mariel Aloe, MD  pantoprazole (PROTONIX) 40 MG tablet Take 1 tablet (40 mg total) by mouth daily at 6 (six) AM. 07/20/17  Yes Mariel Aloe, MD  saccharomyces boulardii (FLORASTOR) 250 MG capsule Take 250  mg by mouth daily.   Yes [provider]  senna (SENOKOT) 8.6 MG tablet Take 2 tablets by mouth daily.   Yes [provider]  traZODone (DESYREL) 50 MG tablet Take 0.5 tablets (25 mg total) by mouth at bedtime. Patient taking differently: Take 50 mg by mouth at bedtime.  07/19/17  Yes Mariel Aloe, MD  insulin glargine (LANTUS) 100 UNIT/ML injection Inject 0.2 mLs (20 Units total) into the skin at bedtime. Patient not taking: Reported on 10/05/2018 06/07/17   Ina Homes, MD  LORazepam (ATIVAN) 0.5 MG tablet Take 1 tablet (0.5 mg total) by mouth every 8 (eight) hours as needed for anxiety. Patient not taking: Reported on 10/05/2018 07/19/17   Mariel Aloe, MD  Nutritional Supplements (FEEDING SUPPLEMENT, NEPRO CARB STEADY,) LIQD Take 237 mLs by mouth 3 (three) times daily between meals. Patient not taking: Reported on 09/09/2017 02/28/17   Cristal Ford, DO  sertraline (ZOLOFT) 50 MG tablet Take 1 tablet (50 mg total) by mouth daily. Patient not taking: Reported on 09/09/2017  07/19/17   Mariel Aloe, MD    Current Facility-Administered Medications  Medication Dose Route Frequency Provider Last Rate Last Dose  . acetaminophen (TYLENOL) tablet 650 mg  650 mg Oral Q6H PRN Shela Leff, MD      . Chlorhexidine Gluconate Cloth 2 % PADS 6 each  6 each Topical Q0600 Edrick Oh, MD      . Darbepoetin Alfa (ARANESP) injection 200 mcg  200 mcg Intravenous Q Mon-HD Edrick Oh, MD   200 mcg at 10/05/18 1051  . insulin aspart (novoLOG) injection 0-9 Units  0-9 Units Subcutaneous TID WC Shela Leff, MD      . morphine 2 MG/ML injection 0.5 mg  0.5 mg Intravenous Q4H PRN Starla Link, Kshitiz, MD      . ondansetron (ZOFRAN) injection 4 mg  4 mg Intravenous Q6H PRN Aline August, MD   4 mg at 10/05/18 1211    Allergies as of 10/05/2018 - Review Complete 10/05/2018  Allergen Reaction Noted  . Penicillins Hives and Other (See Comments) 09/30/2014  . Gabapentin Itching and Swelling 02/09/2013  . Lyrica [pregabalin] Swelling and Other (See Comments) 09/30/2014  . Saphris [asenapine] Swelling and Other (See Comments) 09/30/2014  . Tramadol Swelling and Other (See Comments) 09/30/2014  . Adhesive [tape] Itching 09/09/2017  . Latex Itching 09/09/2017  . Other Other (See Comments) 05/01/2015    Family History  Problem Relation Age of Onset  . Hypertension Mother   . Diabetes Mother   . Hypertension Father   . Diabetes Father     Social History   Socioeconomic History  . Marital status: Single    Spouse name: Not on file  . Number of children: 3  . Years of education: 56  . Highest education level: 12th grade  Occupational History  . Occupation: disabled  Social Needs  . Financial resource strain: Not on file  . Food insecurity:    Worry: Not on file    Inability: Not on file  . Transportation needs:    Medical: Not on file    Non-medical: Not on file  Tobacco Use  . Smoking status: Current Some Day Smoker    Packs/day: 0.10    Years: 30.00     Pack years: 3.00    Types: Cigarettes    Last attempt to quit: 03/31/2015    Years since quitting: 3.5  . Smokeless tobacco: Never Used  Substance and Sexual Activity  .  Alcohol use: Yes    Alcohol/week: 3.0 standard drinks    Types: 3 Standard drinks or equivalent per week    Frequency: Never    Comment: last week  . Drug use: Yes    Types: "Crack" cocaine, Cocaine    Comment: LAST USE YESTERDAY  . Sexual activity: Not Currently    Birth control/protection: Abstinence  Lifestyle  . Physical activity:    Days per week: Not on file    Minutes per session: Not on file  . Stress: Not on file  Relationships  . Social connections:    Talks on phone: Not on file    Gets together: Not on file    Attends religious service: Not on file    Active member of club or organization: Not on file    Attends meetings of clubs or organizations: Not on file    Relationship status: Not on file  . Intimate partner violence:    Fear of current or ex partner: Not on file    Emotionally abused: Not on file    Physically abused: Not on file    Forced sexual activity: Not on file  Other Topics Concern  . Not on file  Social History Narrative   ** Merged History Encounter **   Single, lives with son in a one story home. Unable to exercise. Avoids caffeine. 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        Review of Systems: As per HPI, all others negative  Physical Exam: Vital signs in last 24 hours: Temp:  [97.4 F (36.3 C)-98.5 F (36.9 C)] 98.5 F (36.9 C) (03/02 1326) Pulse Rate:  [83-93] 92 (03/02 1326) Resp:  [10-23] 18 (03/02 1326) BP: (84-181)/(44-87) 106/61 (03/02 1326) SpO2:  [93 %-100 %] 100 % (03/02 1326) Weight:  [77.1 kg-82 kg] 80.1 kg (03/02 1045) Last BM Date: 10/05/18 General:   Somnolent but arousable, NAD Head:  Normocephalic and atraumatic. Eyes:  Sclera clear, no icterus.   Conjunctiva pale Ears:  Normal auditory acuity. Nose:   No deformity, discharge,  or lesions. Mouth:  No deformity or lesions.  Oropharynx pale and dry Abdomen:  Soft, mild epigastric tenderness without peritonitis. No masses, hepatosplenomegaly or hernias noted. Normal bowel sounds, without guarding, and without rebound.     Msk:  Symmetrical without gross deformities. Normal posture. Pulses:  Normal pulses noted. Extremities:  Without clubbing or edema. Neurologic:  Alert and  oriented x4; somnolent, arousable, otherwise grossly normal neurologically. Skin:  Intact without significant lesions or rashes. Psych:  Alert and cooperative. Normal mood and affect.   Lab Results: Recent Labs    10/05/18 0155 10/05/18 0351  WBC 5.3 4.0  HGB 4.6* 4.6*  HCT 14.8* 14.1*  PLT 155 129*   BMET Recent Labs    10/05/18 0155 10/05/18 0351  NA 134* 136  K 6.6* 6.7*  CL 93* 96*  CO2 26 23  GLUCOSE 95 136*  BUN 128* 134*  CREATININE 9.77* 9.75*  CALCIUM 8.2* 9.2   LFT Recent Labs    10/05/18 0155  PROT 6.6  ALBUMIN 3.0*  AST 46*  ALT 50*  ALKPHOS 125  BILITOT 0.8   PT/INR Recent Labs    10/05/18 0155  LABPROT 16.6*  INR 1.4*    Studies/Results: Dg Abd Acute 2+v W 1v Chest  Result Date: 10/05/2018 CLINICAL DATA:  Missed dialysis.  GI bleed. EXAM: DG ABDOMEN ACUTE W/ 1V CHEST COMPARISON:  Chest radiographs  03/16/2018 FINDINGS: Chronic cardiomegaly with slight improvement from prior. Chronic vascular congestion. Improved pulmonary edema. No significant pleural fluid. No bowel dilatation to suggest obstruction. Air-filled transverse colon with air-fluid levels, formed stool in the more proximal and distal colon. No evidence of free air. Advanced vascular calcifications, no radiopaque calculi. No evidence of acute osseous abnormality. IMPRESSION: 1. No free air or bowel obstruction. Mild gaseous distension of transverse colon with air-fluid levels, formed stool proximal and distally. 2. Chronic cardiomegaly and vascular congestion.  Electronically Signed   By: Keith Rake M.D.   On: 10/05/2018 03:02    Impression:  1.  Hematochezia.  Hemodynamically stable (SBP ~110, HR ~ 80s).  Couple prior episodes over the years, for which endoscopy, sigmoidoscopy, and colonoscopy have been done.  It is suspected that bleeding at that time, and likely this time as well, is due to either hemorrhoids or colonic diverticulosis. 2.  Anemia. 3.  Jehovah's witness, will not accept blood transfusion.  Plan:  1.  Monitor supportively (IV fluids, IV iron). NPO for now, if no further rebleeding might start clear liquids tomorrow. 2.  Anusol suppositories. 3.  If further bleeding, would consider tagged RBC study as next step in management.   4.  Given profound anemia, and multiple prior endoscopic/colonoscopic interventions for anemia and bleeding, doubt utility of, and at this time doubt safety of with Hgb < 5, endoscopy/colonoscopy in absence of overt life-hemorrhoids, which patient does not currently have.   LOS: 0 days   Tyreak Reagle M  10/05/2018, 2:18 PM  Cell [phone] If no answer or after 5 PM call [phone]

## 2018-10-05 NOTE — Progress Notes (Signed)
   10/05/18 1000  Clinical Encounter Type  Visited With Patient;Health care provider;Patient not available  Visit Type Initial  Consult/Referral To Faith community;Other (Comment) [facility] hospital visitor list)   Responded to Epic consult entered by RN:  Pt desired Jehovah's Witness to come pray w/ her.   Chaplain went to HD to update pt.  Pt appeared to be sleeping, RN spoke w/ her but pt appeared to have difficulty staying awake to converse.  Chaplain let RN know that chaplain had put note for the [place] hospital visitor(s) to please visit the pt to pray w/ her.  RN suggested that after dialysis may be better time for visit for the pt.  Myra Gianotti resident, [phone]

## 2018-10-05 NOTE — Progress Notes (Addendum)
CRITICAL VALUE ALERT  Critical Value: Hemoglobin 4.2  Date & Time Notied:  10/05/18 at 1550  Provider Notified: Starla Link  Orders Received/Actions taken: Awaiting response. IV iron given. Does not accept blood products.

## 2018-10-05 NOTE — ED Notes (Signed)
Nurse collecting labs. 

## 2018-10-05 NOTE — Progress Notes (Signed)
Critical labs received: K+=6.7, Trop=0.04. On call provider paged, awaiting callback.

## 2018-10-06 ENCOUNTER — Inpatient Hospital Stay (HOSPITAL_COMMUNITY): Payer: Medicare Other

## 2018-10-06 DIAGNOSIS — E119 Type 2 diabetes mellitus without complications: Secondary | ICD-10-CM

## 2018-10-06 DIAGNOSIS — N186 End stage renal disease: Secondary | ICD-10-CM

## 2018-10-06 DIAGNOSIS — Z992 Dependence on renal dialysis: Secondary | ICD-10-CM

## 2018-10-06 DIAGNOSIS — Z794 Long term (current) use of insulin: Secondary | ICD-10-CM

## 2018-10-06 DIAGNOSIS — E875 Hyperkalemia: Secondary | ICD-10-CM

## 2018-10-06 LAB — CBC WITH DIFFERENTIAL/PLATELET
Abs Immature Granulocytes: 0.03 10*3/uL (ref 0.00–0.07)
BASOS ABS: 0 10*3/uL (ref 0.0–0.1)
Basophils Relative: 0 %
Eosinophils Absolute: 0 10*3/uL (ref 0.0–0.5)
Eosinophils Relative: 0 %
HCT: 12.9 % — ABNORMAL LOW (ref 36.0–46.0)
Hemoglobin: 4 g/dL — CL (ref 12.0–15.0)
Immature Granulocytes: 1 %
LYMPHS ABS: 0.9 10*3/uL (ref 0.7–4.0)
Lymphocytes Relative: 20 %
MCH: 32.3 pg (ref 26.0–34.0)
MCHC: 31 g/dL (ref 30.0–36.0)
MCV: 104 fL — ABNORMAL HIGH (ref 80.0–100.0)
Monocytes Absolute: 0.4 10*3/uL (ref 0.1–1.0)
Monocytes Relative: 9 %
NRBC: 0.4 % — AB (ref 0.0–0.2)
Neutro Abs: 3.2 10*3/uL (ref 1.7–7.7)
Neutrophils Relative %: 70 %
Platelets: 134 10*3/uL — ABNORMAL LOW (ref 150–400)
RBC: 1.24 MIL/uL — ABNORMAL LOW (ref 3.87–5.11)
RDW: 18.8 % — ABNORMAL HIGH (ref 11.5–15.5)
WBC: 4.6 10*3/uL (ref 4.0–10.5)

## 2018-10-06 LAB — BASIC METABOLIC PANEL
ANION GAP: 16 — AB (ref 5–15)
Anion gap: 17 — ABNORMAL HIGH (ref 5–15)
BUN: 66 mg/dL — ABNORMAL HIGH (ref 6–20)
BUN: 19 mg/dL (ref 6–20)
CHLORIDE: 99 mmol/L (ref 98–111)
CO2: 22 mmol/L (ref 22–32)
CO2: 24 mmol/L (ref 22–32)
Calcium: 8 mg/dL — ABNORMAL LOW (ref 8.9–10.3)
Calcium: 8.1 mg/dL — ABNORMAL LOW (ref 8.9–10.3)
Chloride: 97 mmol/L — ABNORMAL LOW (ref 98–111)
Creatinine, Ser: 6.31 mg/dL — ABNORMAL HIGH (ref 0.44–1.00)
Creatinine, Ser: 2.54 mg/dL — ABNORMAL HIGH (ref 0.44–1.00)
GFR calc Af Amer: 24 mL/min — ABNORMAL LOW (ref >60)
GFR calc non Af Amer: 7 mL/min — ABNORMAL LOW (ref >60)
GFR calc non Af Amer: 21 mL/min — ABNORMAL LOW (ref >60)
GFR, EST AFRICAN AMERICAN: 8 mL/min — AB (ref >60)
Glucose, Bld: 206 mg/dL — ABNORMAL HIGH (ref 70–99)
Glucose, Bld: 133 mg/dL — ABNORMAL HIGH (ref 70–99)
Potassium: 6.7 mmol/L (ref 3.5–5.1)
Potassium: 3.7 mmol/L (ref 3.5–5.1)
Sodium: 138 mmol/L (ref 135–145)
Sodium: 137 mmol/L (ref 135–145)

## 2018-10-06 LAB — GLUCOSE, CAPILLARY
GLUCOSE-CAPILLARY: 160 mg/dL — AB (ref 70–99)
Glucose-Capillary: 96 mg/dL (ref 70–99)
Glucose-Capillary: 119 mg/dL — ABNORMAL HIGH (ref 70–99)
Glucose-Capillary: 166 mg/dL — ABNORMAL HIGH (ref 70–99)

## 2018-10-06 MED ORDER — CYANOCOBALAMIN 1000 MCG/ML IJ SOLN
1000.0000 ug | INTRAMUSCULAR | Status: DC
  Administered 2018-10-06: 1000 ug via INTRAMUSCULAR
  Filled 2018-10-06 (×2): qty 1

## 2018-10-06 MED ORDER — PENTAFLUOROPROP-TETRAFLUOROETH EX AERO: 1.0000 "application " | INHALATION_SPRAY | CUTANEOUS | Status: DC | PRN

## 2018-10-06 MED ORDER — DARBEPOETIN ALFA 100 MCG/0.5ML IJ SOSY: 100.0000 ug | PREFILLED_SYRINGE | INTRAMUSCULAR | Status: DC

## 2018-10-06 MED ORDER — LIDOCAINE-PRILOCAINE 2.5-2.5 % EX CREA
1.0000 "application " | TOPICAL_CREAM | CUTANEOUS | Status: DC | PRN
  Filled 2018-10-06: qty 5

## 2018-10-06 MED ORDER — EPOETIN ALFA 10000 UNIT/ML IJ SOLN: 8000.0000 [IU] | INTRAMUSCULAR | Status: DC

## 2018-10-06 MED ORDER — EPOETIN ALFA 20000 UNIT/ML IJ SOLN
20000.0000 [IU] | INTRAMUSCULAR | Status: DC
  Administered 2018-10-06 – 2018-10-10 (×3): 20000 [IU] via INTRAVENOUS
  Filled 2018-10-06 (×5): qty 1

## 2018-10-06 MED ORDER — TECHNETIUM TC 99M-LABELED RED BLOOD CELLS IV KIT
25.0000 | PACK | Freq: Once | INTRAVENOUS | Status: AC | PRN
  Administered 2018-10-06: 25 via INTRAVENOUS

## 2018-10-06 MED ORDER — SODIUM BICARBONATE 8.4 % IV SOLN
50.0000 meq | Freq: Once | INTRAVENOUS | Status: DC
  Filled 2018-10-06: qty 50

## 2018-10-06 MED ORDER — SODIUM CHLORIDE 0.9 % IV SOLN: 100.0000 mL | INTRAVENOUS | Status: DC | PRN

## 2018-10-06 MED ORDER — ALBUTEROL SULFATE (2.5 MG/3ML) 0.083% IN NEBU
2.5000 mg | INHALATION_SOLUTION | Freq: Once | RESPIRATORY_TRACT | Status: AC
  Administered 2018-10-06: 2.5 mg via RESPIRATORY_TRACT
  Filled 2018-10-06: qty 3

## 2018-10-06 MED ORDER — FOLIC ACID 1 MG PO TABS
1.0000 mg | ORAL_TABLET | Freq: Every day | ORAL | Status: DC
  Administered 2018-10-06 – 2018-10-09 (×4): 1 mg via ORAL
  Filled 2018-10-06 (×5): qty 1

## 2018-10-06 MED ORDER — ALTEPLASE 2 MG IJ SOLR: 2.0000 mg | Freq: Once | INTRAMUSCULAR | Status: DC | PRN

## 2018-10-06 MED ORDER — CHLORHEXIDINE GLUCONATE CLOTH 2 % EX PADS: 6.0000 | MEDICATED_PAD | Freq: Every day | CUTANEOUS | Status: DC

## 2018-10-06 MED ORDER — LIDOCAINE HCL (PF) 1 % IJ SOLN: 5.0000 mL | INTRAMUSCULAR | Status: DC | PRN

## 2018-10-06 MED ORDER — EPOETIN ALFA 20000 UNIT/ML IJ SOLN: 20000.0000 [IU] | INTRAMUSCULAR | Status: DC

## 2018-10-06 MED ORDER — INSULIN ASPART 100 UNIT/ML ~~LOC~~ SOLN: 5.0000 [IU] | Freq: Once | SUBCUTANEOUS | Status: DC

## 2018-10-06 MED ORDER — DEXTROSE 50 % IV SOLN
25.0000 mL | Freq: Once | INTRAVENOUS | Status: DC
  Filled 2018-10-06: qty 50

## 2018-10-06 NOTE — Progress Notes (Signed)
Received call from blood bank that patient needs a blood tranfusion refusal form signed and faxed back to them as soon as possible. When bringing the patient the form, she said she did not want any blood products but wants to check with her congregation to see exactly what she can take (albumin, etc.) before signing the form.

## 2018-10-06 NOTE — Progress Notes (Signed)
Patient in dialysis, RN to replace consult if the patient needs it.,when she returns to the floor.

## 2018-10-06 NOTE — Progress Notes (Signed)
CRITICAL VALUE ALERT  Critical Value:  K 6.7  Date & Time Notied:  10/06/18 0707  Provider Notified: Dr. Starla Link  Orders Received/Actions taken: Notified Elisa, dayshift RN of critical value. Elisa, RN will follow up.

## 2018-10-06 NOTE — Progress Notes (Signed)
Patient refusing telemetry due to discomfort. MD aware. Educated on importance of keeping telemetry on.

## 2018-10-06 NOTE — Progress Notes (Signed)
Patient had one dark red small bowel movement around 1845. Also has vomited twice this evening, zofran given and awaiting effectiveness. Will continue to monitor.

## 2018-10-06 NOTE — Progress Notes (Addendum)
[facility] Progress Note   Subjective:   Patient seen in room. Reports 2 episodes of vomiting and melena this AM. Ongoing nausea/abdominal pain. Hgb 4.0 this AM, does not accept blood products. Reports feeling tired but denies CP, palpitations, SOB/dyspnea. K back up to 6.7, order for dialysis in for this morning. Would like to eat, per nursing notes ate a sandwich and pizza last night but supposed to be NPO.   Objective Vitals:   10/06/18 0615 10/06/18 0800 10/06/18 0848 10/06/18 0856  BP:   136/67   Pulse:      Resp:   20   Temp:  98.2 F (36.8 C)    TempSrc:  Oral    SpO2:   90% 97%  Weight: 80.5 kg     Height:       Physical Exam General: Well developed, fatigued appearing female. Pale. In NAD Heart: RRR, nu murmurs, rubs or gallops Lungs: Respirations even and unlabored. CTA bilaterally without wheezing, rhonchi or rales Abdomen: Soft, moderately tender diffusely. + BS. No palpable masses Extremities: No peripheral edema or ischemic changes noted.  Dialysis Access: LUE AVG, + bruit  Additional Objective Labs: Basic Metabolic Panel: Recent Labs  Lab 10/05/18 0351 10/05/18 1508 10/06/18 0552  NA 136 137 138  K 6.7* 4.5 6.7*  CL 96* 99 99  CO2 23 22 22   GLUCOSE 136* 153* 206*  BUN 134* 48* 66*  CREATININE 9.75* 4.44* 6.31*  CALCIUM 9.2 8.2* 8.0*   Liver Function Tests: Recent Labs  Lab 10/05/18 0155  AST 46*  ALT 50*  ALKPHOS 125  BILITOT 0.8  PROT 6.6  ALBUMIN 3.0*   CBC: Recent Labs  Lab 10/05/18 0155 10/05/18 0351 10/05/18 1508 10/06/18 0552  WBC 5.3 4.0 4.0 4.6  NEUTROABS 3.5  --  3.3 3.2  HGB 4.6* 4.6* 4.2* 4.0*  HCT 14.8* 14.1* 13.3* 12.9*  MCV 99.3 97.9 100.0 104.0*  PLT 155 129* 128* 134*   Blood Culture    Component Value Date/Time   SDES BLOOD RIGHT UPPER ARM 02/25/2017 1927   SPECREQUEST  02/25/2017 1927    BOTTLES DRAWN AEROBIC AND ANAEROBIC Blood Culture adequate volume   CULT NO GROWTH 5 DAYS 02/25/2017 1927    REPTSTATUS 03/02/2017 FINAL 02/25/2017 1927    Cardiac Enzymes: Recent Labs  Lab 10/05/18 0351 10/05/18 1508  TROPONINI 0.04* 0.03*   CBG: Recent Labs  Lab 10/05/18 1014 10/05/18 1131 10/05/18 1636 10/05/18 2156 10/06/18 0749  GLUCAP 122* 120* 150* 166* 160*    Studies/Results: Dg Abd Acute 2+v W 1v Chest  Result Date: 10/05/2018 CLINICAL DATA:  Missed dialysis.  GI bleed. EXAM: DG ABDOMEN ACUTE W/ 1V CHEST COMPARISON:  Chest radiographs 03/16/2018 FINDINGS: Chronic cardiomegaly with slight improvement from prior. Chronic vascular congestion. Improved pulmonary edema. No significant pleural fluid. No bowel dilatation to suggest obstruction. Air-filled transverse colon with air-fluid levels, formed stool in the more proximal and distal colon. No evidence of free air. Advanced vascular calcifications, no radiopaque calculi. No evidence of acute osseous abnormality. IMPRESSION: 1. No free air or bowel obstruction. Mild gaseous distension of transverse colon with air-fluid levels, formed stool proximal and distally. 2. Chronic cardiomegaly and vascular congestion. Electronically Signed   By: Keith Rake M.D.   On: 10/05/2018 03:02   Medications:  . Chlorhexidine Gluconate Cloth  6 each Topical Q0600  . dextrose  25 mL Intravenous Once  . epoetin (EPOGEN/PROCRIT) injection  8,000 Units Intravenous Q T,Th,Sat-1800  . insulin aspart  0-9 Units Subcutaneous TID WC  . insulin aspart  5 Units Intravenous Once  . sodium bicarbonate  50 mEq Intravenous Once    Home meds:  - amlodipine 10/ carvedilol 25 bid/ clonidine 0.1 tid  - insulin detemir 20 u qd/ insulin aspart 7u tid  - trazodone 25mg  hs/ sertraline 50 qd/ oxycodone 10mg  prn/ duloxetine 90 qd  - allopurinol 100 tts/ atorvastatin 40/ calc acetate 2 ac tid/ pantoprazole 40 qm  - prn's / vitamins   TTS G/O  3.5h  2/2 bath  75kg   LUE AVG   Hep 7000    - mircera 50 every 2 wks  - calcitriol 1.75ug tiw  - venofer 50  /wk  - last Hb 10.0 on 2/27   Assessment/Plan: 1. GI bleed: Reports further bleeding this AM. Hgb 4.0. Nurse reports primary is aware, possible plan for bleeding scan. Received aranesp 200 mcg IV and feraheme 510mg  yesterday. Epogen already ordered. Will increase dose to 20,000 units TIW. 2. ESRD: K improved after dialysis yesterday but now back up to 6.7. Order for dialysis with 0K bath for 60 min, then 1K bath for 90 minutes, then 2K bath per Dr. Jonnie Finner. Patient reminded that diet is currently NPO. Will follow labs.  3. HTN/volume:  BP soft, likely due to anemia. Not currently on BP meds. UF 1-2L as tolerated today, keep systolic BP >81.  4. Anemia: Hgb 4.0, see #1. MCV elevated. Will start empiric vitamin K48 and folic acid. 5. Secondary hyperparathyroidism:  Ca 8.0, corrected 8.8. Currently NPO, no binder necessary.  6. Nutrition:  Albumin 3.0, currently NPO due to GIB.  7. T2DM: Insulin management per primary team.  Anice Paganini, PA-C 10/06/2018, 9:26 AM  [facility] Pager: [phone]  Pt seen, examined and agree w A/P as above. ESRD pt w/ GIB/ severe anemia and will not take blood transfusions.  Recommend high dose EPO for this pt in a serious condition regarding anemia so will not worry about potential side effects of high dose ESA at this time.  [facility] 10/06/2018, 11:31 AM

## 2018-10-06 NOTE — Progress Notes (Signed)
Pt ate a piece of pizza she had in her bag. Pt also has a sub sandwich in her personal bookbag. Instructed pt not to eat anything. Pt states that she is weak and she is going to eat what she wants to. Will continue to monitor pt. Ranelle Oyster, RN

## 2018-10-06 NOTE — Progress Notes (Signed)
No charge note.  Attempted to see patient multiple times.  She was in HD and then Nuclear Medicine.  PMT will attempt to see her again tomorrow.  Florentina Jenny, PA-C Palliative Medicine Pager: [phone]

## 2018-10-06 NOTE — Progress Notes (Signed)
Subjective: Some more blood in stool per nursing (patient didn't see it). Some generalized abdominal soreness  Objective: Vital signs in last 24 hours: Temp:  [98.2 F (36.8 C)-99.4 F (37.4 C)] 98.3 F (36.8 C) (03/03 0945) Pulse Rate:  [86-98] 91 (03/03 1230) Resp:  [14-21] 18 (03/03 0945) BP: (94-136)/(44-67) 121/60 (03/03 1230) SpO2:  [90 %-100 %] 97 % (03/03 0945) Weight:  [75.6 kg-80.5 kg] 75.6 kg (03/03 0945) Weight change: 4.889 kg Last BM Date: 10/06/18  PE: GEN:  NAD, alert, somnolent but arousable. ABD:  Soft, mild periumbilical tenderness  Lab Results: CBC    Component Value Date/Time   WBC 4.6 10/06/2018 0552   RBC 1.24 (L) 10/06/2018 0552   HGB 4.0 (LL) 10/06/2018 0552   HGB 9.3 (L) 07/20/2013 1456   HCT 12.9 (L) 10/06/2018 0552   HCT 28.8 (L) 07/20/2013 1456   PLT 134 (L) 10/06/2018 0552   PLT 271 07/20/2013 1456   MCV 104.0 (H) 10/06/2018 0552   MCV 90.5 07/20/2013 1456   MCH 32.3 10/06/2018 0552   MCHC 31.0 10/06/2018 0552   RDW 18.8 (H) 10/06/2018 0552   RDW 15.8 (H) 07/20/2013 1456   LYMPHSABS 0.9 10/06/2018 0552   LYMPHSABS 2.3 07/20/2013 1456   MONOABS 0.4 10/06/2018 0552   MONOABS 0.3 07/20/2013 1456   EOSABS 0.0 10/06/2018 0552   EOSABS 0.1 07/20/2013 1456   BASOSABS 0.0 10/06/2018 0552   BASOSABS 0.0 07/20/2013 1456   CMP     Component Value Date/Time   NA 138 10/06/2018 0552   NA 130 (A) 12/13/2015   K 6.7 (HH) 10/06/2018 0552   CL 99 10/06/2018 0552   CO2 22 10/06/2018 0552   GLUCOSE 206 (H) 10/06/2018 0552   BUN 66 (H) 10/06/2018 0552   BUN 68 (A) 12/13/2015   CREATININE 6.31 (H) 10/06/2018 0552   CALCIUM 8.0 (L) 10/06/2018 0552   PROT 6.6 10/05/2018 0155   ALBUMIN 3.0 (L) 10/05/2018 0155   AST 46 (H) 10/05/2018 0155   ALT 50 (H) 10/05/2018 0155   ALKPHOS 125 10/05/2018 0155   BILITOT 0.8 10/05/2018 0155   GFRNONAA 7 (L) 10/06/2018 0552   GFRAA 8 (L) 10/06/2018 0552   Assessment:  1.  Hematochezia.  Hemodynamically  stable (SBP ~110, HR ~ 80s).  Couple prior episodes over the years, for which endoscopy, sigmoidoscopy, and colonoscopy have been done.  It is suspected that bleeding at that time, and likely this time as well, is due to either hemorrhoids or colonic diverticulosis. 2.  Anemia. Hgb dropping now to 4. 3.  Jehovah's witness, will not accept blood transfusion.  Plan:  1.  IV iron, IV fluids. 2.  Tagged RBC study for ongoing bleeding. 3.  Given profound anemia, and multiple prior endoscopic/colonoscopic interventions for anemia and bleeding, doubt utility of, and at this time doubt safety of with Hgb < 5, endoscopy/colonoscopy in absence of hemodynamically unstable GI bleeding, which patient does not currently have. 4.  [facility] will follow.    Landry Dyke 10/06/2018, 1:31 PM   Cell [phone] If no answer or after 5 PM call [phone]

## 2018-10-06 NOTE — Progress Notes (Signed)
Patient ID: [name], female   DOB: 11-30-1965, 53 y.o.   MRN: [id number]  PROGRESS NOTE    GEORGEAN SPAINHOWER  JIR:[id number] DOB: 01/18/1966 DOA: 10/05/2018 PCP: Patient, [name]   Brief Narrative:  54 year old female with history of end-stage renal disease on hemodialysis, chronic combined systolic and diastolic heart failure, hypertension, hyperlipidemia, diabetes mellitus type 2, CVA/TIA, Jehovah's Witness presented with bloody bowel movements.  Hemoglobin was 4.6 on admission.  She persistently refused blood transfusion.  GI and nephrology were consulted.  Assessment & Plan:   Principal Problem:   GI bleed Active Problems:   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 (HCC)   Hyperkalemia   ESRD on dialysis ([place])   EKG abnormality   Lower GI bleeding presenting with rectal bleeding  -Patient had 2 bloody bowel movements overnight as per the nursing staff.  GI evaluation appreciated.  Will order bleeding scan as per GI recommendations from yesterday.  Follow further recommendations from GI.  Acute blood loss anemia from rectal bleeding -Hemoglobin is 4 today.  Presented with hemoglobin of 4.6; was 12.2 5 months ago -Tried to emphasize the need for blood transfusion.  Patient is still refusing blood transfusion -Patient received 1 dose of IV iron on 10/05/2018.  She also received Aranesp on 10/05/2018.  The patient has been ordered. -Monitor H&H. -Because of overall guarded prognosis, palliative care has been also consulted -Patient is supposed to be n.p.o. but ate pizza last night as per nursing staff.  End-stage renal disease on hemodialysis -Had dialysis yesterday.  Nephrology following.  Probable dialysis today as well.  Hyperkalemia -Potassium 6.7 today.  I have ordered IV insulin, half amp of 50% dextrose, 1 amp of sodium bicarb, albuterol nebulized inhalation once.  Monitor potassium.  Hopefully potassium will improve after dialysis.  Nephrology following -Patient is  refusing telemetry monitoring.  Diabetes mellitus type 2 -Continue CBGs with insulin sliding scale coverage   DVT prophylaxis: SCDs Code Status: Full Family Communication: None at bedside Disposition Plan: Depends on clinical outcome  Consultants: Nephrology/gastroenterology  Procedures: None  Antimicrobials: None   Subjective: Patient seen and examined at bedside.  She is more awake today.  She is a poor historian.  She adamantly refuses blood transfusion.  She wants her bleeding to be fixed.  Nursing staff reports that she had 2 bloody bowel movements overnight.  Objective: Vitals:   10/06/18 0615 10/06/18 0800 10/06/18 0848 10/06/18 0856  BP:   136/67   Pulse:      Resp:   20   Temp:  98.2 F (36.8 C)    TempSrc:  Oral    SpO2:   90% 97%  Weight: 80.5 kg     Height:        Intake/Output Summary (Last 24 hours) at 10/06/2018 1014 Last data filed at 10/05/2018 1045 Gross per 24 hour  Intake -  Output 2000 ml  Net -2000 ml   Filed Weights   10/05/18 0710 10/05/18 1045 10/06/18 0615  Weight: 82 kg 80.1 kg 80.5 kg    Examination:  General exam: Awake, answers questions, intermittently gets angry.  Poor historian Respiratory system: Bilateral decreased breath sounds at bases, basilar crackles Cardiovascular system: S1 & S2 heard, Rate controlled Gastrointestinal system: Abdomen is nondistended, soft and nontender. Normal bowel sounds heard. Extremities: No cyanosis, clubbing; trace edema  Central nervous system: Alert and oriented. No focal neurological deficits. Moving extremities Lymph: No cervical lymphadenopathy Psychiatry: Anxious.  Intermittently gets agitated and  angry and does not answer questions appropriately.   Data Reviewed: I have personally reviewed following labs and imaging studies  CBC: Recent Labs  Lab 10/05/18 0155 10/05/18 0351 10/05/18 1508 10/06/18 0552  WBC 5.3 4.0 4.0 4.6  NEUTROABS 3.5  --  3.3 3.2  HGB 4.6* 4.6* 4.2* 4.0*  HCT  14.8* 14.1* 13.3* 12.9*  MCV 99.3 97.9 100.0 104.0*  PLT 155 129* 128* 381*   Basic Metabolic Panel: Recent Labs  Lab 10/05/18 0155 10/05/18 0351 10/05/18 1508 10/06/18 0552  NA 134* 136 137 138  K 6.6* 6.7* 4.5 6.7*  CL 93* 96* 99 99  CO2 26 23 22 22   GLUCOSE 95 136* 153* 206*  BUN 128* 134* 48* 66*  CREATININE 9.77* 9.75* 4.44* 6.31*  CALCIUM 8.2* 9.2 8.2* 8.0*   GFR: Estimated Creatinine Clearance: 10.7 mL/min (A) (by C-G formula based on SCr of 6.31 mg/dL (H)). Liver Function Tests: Recent Labs  Lab 10/05/18 0155  AST 46*  ALT 50*  ALKPHOS 125  BILITOT 0.8  PROT 6.6  ALBUMIN 3.0*   No results for input(s): LIPASE, AMYLASE in the last 168 hours. No results for input(s): AMMONIA in the last 168 hours. Coagulation Profile: Recent Labs  Lab 10/05/18 0155  INR 1.4*   Cardiac Enzymes: Recent Labs  Lab 10/05/18 0351 10/05/18 1508  TROPONINI 0.04* 0.03*   BNP (last 3 results) No results for input(s): PROBNP in the last 8760 hours. HbA1C: No results for input(s): HGBA1C in the last 72 hours. CBG: Recent Labs  Lab 10/05/18 1014 10/05/18 1131 10/05/18 1636 10/05/18 2156 10/06/18 0749  GLUCAP 122* 120* 150* 166* 160*   Lipid Profile: No results for input(s): CHOL, HDL, LDLCALC, TRIG, CHOLHDL, LDLDIRECT in the last 72 hours. Thyroid Function Tests: No results for input(s): TSH, T4TOTAL, FREET4, T3FREE, THYROIDAB in the last 72 hours. Anemia Panel: No results for input(s): VITAMINB12, FOLATE, FERRITIN, TIBC, IRON, RETICCTPCT in the last 72 hours. Sepsis Labs: No results for input(s): PROCALCITON, LATICACIDVEN in the last 168 hours.  Recent Results (from the past 240 hour(s))  MRSA PCR Screening     Status: Abnormal   Collection Time: 10/05/18  6:31 AM  Result Value Ref Range Status   MRSA by PCR POSITIVE (A) NEGATIVE Final    Comment:        The GeneXpert MRSA Assay (FDA approved for NASAL specimens only), is one component of a comprehensive MRSA  colonization surveillance program. It is not intended to diagnose MRSA infection nor to guide or monitor treatment for MRSA infections. RESULT CALLED TO, READ BACK BY AND VERIFIED WITH: Shary Key RN 9:40 10/05/18 ([email or web address]) Performed at [facility], [place] [address], [place], [place] [address]          Radiology Studies: Dg Abd Acute 2+v W 1v Chest  Result Date: 10/05/2018 CLINICAL DATA:  Missed dialysis.  GI bleed. EXAM: DG ABDOMEN ACUTE W/ 1V CHEST COMPARISON:  Chest radiographs 03/16/2018 FINDINGS: Chronic cardiomegaly with slight improvement from prior. Chronic vascular congestion. Improved pulmonary edema. No significant pleural fluid. No bowel dilatation to suggest obstruction. Air-filled transverse colon with air-fluid levels, formed stool in the more proximal and distal colon. No evidence of free air. Advanced vascular calcifications, no radiopaque calculi. No evidence of acute osseous abnormality. IMPRESSION: 1. No free air or bowel obstruction. Mild gaseous distension of transverse colon with air-fluid levels, formed stool proximal and distally. 2. Chronic cardiomegaly and vascular congestion. Electronically Signed   By: Threasa Beards  Sanford M.D.   On: 10/05/2018 03:02        Scheduled Meds: . Chlorhexidine Gluconate Cloth  6 each Topical Q0600  . dextrose  25 mL Intravenous Once  . epoetin (EPOGEN/PROCRIT) injection  8,000 Units Intravenous Q T,Th,Sat-1800  . insulin aspart  0-9 Units Subcutaneous TID WC  . insulin aspart  5 Units Intravenous Once  . sodium bicarbonate  50 mEq Intravenous Once   Continuous Infusions: . sodium chloride    . sodium chloride       LOS: 1 day        Aline August, MD [facility] 10/06/2018, 10:14 AM

## 2018-10-07 DIAGNOSIS — D62 Acute posthemorrhagic anemia: Secondary | ICD-10-CM

## 2018-10-07 DIAGNOSIS — K922 Gastrointestinal hemorrhage, unspecified: Secondary | ICD-10-CM

## 2018-10-07 DIAGNOSIS — Z789 Other specified health status: Secondary | ICD-10-CM

## 2018-10-07 DIAGNOSIS — Z7189 Other specified counseling: Secondary | ICD-10-CM

## 2018-10-07 DIAGNOSIS — Z515 Encounter for palliative care: Secondary | ICD-10-CM

## 2018-10-07 LAB — CBC WITH DIFFERENTIAL/PLATELET
Abs Immature Granulocytes: 0.09 10*3/uL — ABNORMAL HIGH (ref 0.00–0.07)
Basophils Absolute: 0 10*3/uL (ref 0.0–0.1)
Basophils Relative: 0 %
EOS ABS: 0 10*3/uL (ref 0.0–0.5)
EOS PCT: 0 %
HEMATOCRIT: 14.1 % — AB (ref 36.0–46.0)
Hemoglobin: 4.4 g/dL — CL (ref 12.0–15.0)
Immature Granulocytes: 1 %
LYMPHS ABS: 1.6 10*3/uL (ref 0.7–4.0)
LYMPHS PCT: 23 %
MCH: 32.4 pg (ref 26.0–34.0)
MCHC: 31.2 g/dL (ref 30.0–36.0)
MCV: 103.7 fL — ABNORMAL HIGH (ref 80.0–100.0)
Monocytes Absolute: 0.8 10*3/uL (ref 0.1–1.0)
Monocytes Relative: 11 %
Neutro Abs: 4.5 10*3/uL (ref 1.7–7.7)
Neutrophils Relative %: 65 %
Platelets: 150 10*3/uL (ref 150–400)
RBC: 1.36 MIL/uL — ABNORMAL LOW (ref 3.87–5.11)
RDW: 19.4 % — ABNORMAL HIGH (ref 11.5–15.5)
WBC: 7 10*3/uL (ref 4.0–10.5)
nRBC: 3.1 % — ABNORMAL HIGH (ref 0.0–0.2)

## 2018-10-07 LAB — BASIC METABOLIC PANEL
Anion gap: 16 — ABNORMAL HIGH (ref 5–15)
BUN: 37 mg/dL — ABNORMAL HIGH (ref 6–20)
CO2: 27 mmol/L (ref 22–32)
CREATININE: 4.16 mg/dL — AB (ref 0.44–1.00)
Calcium: 8.2 mg/dL — ABNORMAL LOW (ref 8.9–10.3)
Chloride: 95 mmol/L — ABNORMAL LOW (ref 98–111)
GFR calc Af Amer: 13 mL/min — ABNORMAL LOW (ref >60)
GFR calc non Af Amer: 12 mL/min — ABNORMAL LOW (ref >60)
Glucose, Bld: 164 mg/dL — ABNORMAL HIGH (ref 70–99)
Potassium: 3.9 mmol/L (ref 3.5–5.1)
Sodium: 138 mmol/L (ref 135–145)

## 2018-10-07 LAB — MAGNESIUM: Magnesium: 2.2 mg/dL (ref 1.7–2.4)

## 2018-10-07 LAB — HEPATITIS B SURFACE ANTIGEN: Hepatitis B Surface Ag: NEGATIVE

## 2018-10-07 LAB — GLUCOSE, CAPILLARY
Glucose-Capillary: 141 mg/dL — ABNORMAL HIGH (ref 70–99)
Glucose-Capillary: 239 mg/dL — ABNORMAL HIGH (ref 70–99)
Glucose-Capillary: 254 mg/dL — ABNORMAL HIGH (ref 70–99)
Glucose-Capillary: 297 mg/dL — ABNORMAL HIGH (ref 70–99)

## 2018-10-07 LAB — HEPATITIS B SURFACE ANTIBODY,QUALITATIVE: Hep B S Ab: NONREACTIVE

## 2018-10-07 MED ORDER — SUCRALFATE 1 GM/10ML PO SUSP: 1.0000 g | Freq: Three times a day (TID) | ORAL | Status: DC

## 2018-10-07 MED ORDER — SUCRALFATE 1 GM/10ML PO SUSP
1.0000 g | Freq: Three times a day (TID) | ORAL | Status: DC
  Administered 2018-10-07 – 2018-10-09 (×9): 1 g via ORAL
  Filled 2018-10-07 (×10): qty 10

## 2018-10-07 MED ORDER — PANTOPRAZOLE SODIUM 40 MG PO TBEC
40.0000 mg | DELAYED_RELEASE_TABLET | Freq: Two times a day (BID) | ORAL | Status: DC
  Administered 2018-10-07 – 2018-10-09 (×5): 40 mg via ORAL
  Filled 2018-10-07 (×5): qty 1

## 2018-10-07 NOTE — Plan of Care (Signed)
  Problem: Spiritual Needs Goal: Ability to function at adequate level Outcome: Progressing   Problem: Spiritual Needs Goal: Ability to function at adequate level Outcome: Progressing   Problem: Education: Goal: Knowledge of General Education information will improve Description Including pain rating scale, medication(s)/side effects and non-pharmacologic comfort measures Outcome: Progressing   Problem: Health Behavior/Discharge Planning: Goal: Ability to manage health-related needs will improve Outcome: Progressing   Problem: Clinical Measurements: Goal: Ability to maintain clinical measurements within normal limits will improve Outcome: Progressing Goal: Will remain free from infection Outcome: Progressing Goal: Diagnostic test results will improve Outcome: Progressing Goal: Respiratory complications will improve Outcome: Progressing Goal: Cardiovascular complication will be avoided Outcome: Progressing   Problem: Activity: Goal: Risk for activity intolerance will decrease Outcome: Progressing   Problem: Nutrition: Goal: Adequate nutrition will be maintained Outcome: Progressing   Problem: Coping: Goal: Level of anxiety will decrease Outcome: Progressing   Problem: Elimination: Goal: Will not experience complications related to bowel motility Outcome: Progressing Goal: Will not experience complications related to urinary retention Outcome: Progressing   Problem: Pain Managment: Goal: General experience of comfort will improve Outcome: Progressing   Problem: Safety: Goal: Ability to remain free from injury will improve Outcome: Progressing   Problem: Skin Integrity: Goal: Risk for impaired skin integrity will decrease Outcome: Progressing

## 2018-10-07 NOTE — Progress Notes (Addendum)
[facility] Progress Note   Subjective:   Patient seen in room. Feeling better today, reports some nausea but improving. Another episode of dark red stool last night per nursing. Tagged RBC scan yesterday negative for active GI bleeding however per notes patient refused further imaging after 1 hour. Hgb improved slightly to 4.4.   Objective Vitals:   10/06/18 1325 10/06/18 1415 10/06/18 2028 10/07/18 0541  BP: 134/62 (!) 127/55 (!) 143/65 140/75  Pulse: 89 92 (!) 101 96  Resp: 18 20 19 18   Temp: 98 F (36.7 C) 98.2 F (36.8 C) 100.3 F (37.9 C) 99.2 F (37.3 C)  TempSrc: Oral Oral Oral Oral  SpO2: 98% 95% 97% 96%  Weight: 75 kg     Height:       Physical Exam General: Well developed, pale female resting in bed in NAD Heart:RRR, no murmurs, rubs or gallops Lungs: CTA bilaterally without wheezing, rhonchi or rales. Respirations even and unlabored Abdomen: Soft, non-tender, non-distended. + BS. No palpable masses Extremities: s/p transmetatarsal amputation. No peripheral edema Dialysis Access: LUE AVG, +thrill  Additional Objective Labs: Basic Metabolic Panel: Recent Labs  Lab 10/06/18 0552 10/06/18 1522 10/07/18 0416  NA 138 137 138  K 6.7* 3.7 3.9  CL 99 97* 95*  CO2 22 24 27   GLUCOSE 206* 133* 164*  BUN 66* 19 37*  CREATININE 6.31* 2.54* 4.16*  CALCIUM 8.0* 8.1* 8.2*   Liver Function Tests: Recent Labs  Lab 10/05/18 0155  AST 46*  ALT 50*  ALKPHOS 125  BILITOT 0.8  PROT 6.6  ALBUMIN 3.0*   No results for input(s): LIPASE, AMYLASE in the last 168 hours. CBC: Recent Labs  Lab 10/05/18 0155 10/05/18 0351 10/05/18 1508 10/06/18 0552 10/07/18 0416  WBC 5.3 4.0 4.0 4.6 7.0  NEUTROABS 3.5  --  3.3 3.2 4.5  HGB 4.6* 4.6* 4.2* 4.0* 4.4*  HCT 14.8* 14.1* 13.3* 12.9* 14.1*  MCV 99.3 97.9 100.0 104.0* 103.7*  PLT 155 129* 128* 134* 150   Blood Culture    Component Value Date/Time   SDES BLOOD RIGHT UPPER ARM 02/25/2017 1927   SPECREQUEST  02/25/2017 1927    BOTTLES DRAWN AEROBIC AND ANAEROBIC Blood Culture adequate volume   CULT NO GROWTH 5 DAYS 02/25/2017 1927   REPTSTATUS 03/02/2017 FINAL 02/25/2017 1927    Cardiac Enzymes: Recent Labs  Lab 10/05/18 0351 10/05/18 1508  TROPONINI 0.04* 0.03*   CBG: Recent Labs  Lab 10/06/18 0749 10/06/18 1426 10/06/18 1834 10/06/18 2137 10/07/18 0807  GLUCAP 160* 96 119* 166* 141*   Iron Studies: No results for input(s): IRON, TIBC, TRANSFERRIN, FERRITIN in the last 72 hours. @lablastinr3 @ Studies/Results: Nm Gi Blood Loss  Result Date: 10/06/2018 CLINICAL DATA:  GI bleed EXAM: NUCLEAR MEDICINE GASTROINTESTINAL BLEEDING SCAN TECHNIQUE: Sequential abdominal images were obtained following intravenous administration of Tc-59m labeled red blood cells. RADIOPHARMACEUTICALS:  24.1 mCi Tc-36m pertechnetate in-vitro labeled red cells. COMPARISON:  None. FINDINGS: Significant activity in the stomach due to free technetium secretion by the stomach. This is stable over the 1 hour time frame of the study. The patient refused further imaging after 1 hour. No evidence of GI bleeding.  No activity in the colon. IMPRESSION: Negative for active GI bleeding at this time. Electronically Signed   By: Franchot Gallo M.D.   On: 10/06/2018 18:39   Medications:  . Chlorhexidine Gluconate Cloth  6 each Topical Q0600  . cyanocobalamin  1,000 mcg Intramuscular Q7 days  . dextrose  25  mL Intravenous Once  . epoetin (EPOGEN/PROCRIT) injection  20,000 Units Intravenous Q T,Th,Sa-HD  . folic acid  1 mg Oral Daily  . insulin aspart  0-9 Units Subcutaneous TID WC  . insulin aspart  5 Units Intravenous Once  . sodium bicarbonate  50 mEq Intravenous Once    Dialysis Orders: 3.5h 2/2 bath 75kg LUE AVG Hep 7000  - mircera 50 every 2 wks - calcitriol 1.75ug tiw - venofer 50 /wk - last Hb 10.0 on 2/27  Assessment/Plan: 1. GI bleed: 1 episode of lower GIB per nursing notes last  night however patient reports feeling much better. Hgb 4.4. Nuclear scan yesterday reportedly showed no active bleeding. Will continue high dose epogen, N16 and folic acid.  Further management per GI/primary 2. ESRD: K improved to 3.9 this AM. Will plan for HD tomorrow with 2K bath due to frequent hyperkalemia. low UF goal. Follow K 3. HTN/volume:  BP stable/slightly elevated today. No edema on exam. Will plan for HD tomorrow with low UF goal, keep systolic BP >57.  4. Anemia: See #1. Continue epogen, vit X03 and folic acid 5. Secondary hyperparathyroidism: Ca 8.2, corrected 8.8. Follow phos and consider binder when patient begins eating again.  6. Nutrition:  Albumin 3.0, minimal PO intake due to GIB. Expect improvement when patient begins eating regularly.   Anice Paganini, PA-C 10/07/2018, 10:07 AM  [facility] Pager: [phone]  Pt seen, examined and agree w A/P as above.  [facility] 10/07/2018, 12:10 PM

## 2018-10-07 NOTE — Progress Notes (Signed)
Request to IR for possible GI bleed embolization. Per chart review patient hemodynamically stable currently, NM scan yesterday (-) for GI bleed, GI service following closely.  No need for embolization currently - will cancel consult order. Please place order again in the future if you feel this patient would benefit from repeat IR consult.  Please call with questions or concerns.  Candiss Norse, PA-C Pager# [phone]

## 2018-10-07 NOTE — Progress Notes (Signed)
PROGRESS NOTE        PATIENT DETAILS Name: [name] Age: 53 y.o. Sex: female Date of Birth: 1965/10/23 Admit Date: 10/05/2018 Admitting Physician Shela Leff, MD [id number], [name]  Brief Narrative: Patient is a 53 y.o. female Jehovah's Witness-history of ESRD on HD, chronic combined systolic and diastolic heart failure, DM-2, left transmetatarsal foot amputation-presenting with lower GI bleed and acute blood loss anemia.  She is emphatic about refusing PRBC transfusion even in the face of a life-threatening situation.  See below for further details  Subjective: Claims no further hematochezia overnight.  Assessment/Plan: Lower GI bleeding with acute blood loss anemia: Claims no further hematochezia overnight-hemoglobin stable at 4.4-refuses PRBC transfusion as she is a Jehovah's Witness.  Tagged RBC scan on 3/3 was negative, and GI following.  She already is on erythropoietin injections with HD-minimize blood draws as much as possible-use pediatric tubes.  Apart from PRBC transfusion which she refuses-really no other option to get her anemia improved in the short-term.  Unfortunately has fatigue and feels tired all the time which I suspect is from symptomatic anemia.  Plans are to continue supportive care.  ESRD on HD TTS: Nephrology following-defer further to nephrology.  DM-2: CBGs stable-continue SSI.  Hypertension: Blood pressure appears controlled-not on any antihypertensives at this point.  Follow.  DVT Prophylaxis: SCD's  Code Status: Full code  Family Communication: None at bedside  Disposition Plan: Remain inpatient- SNF on discharge  Antimicrobial agents: Anti-infectives (From admission, onward)   None      Procedures: None  CONSULTS:  GI, nephrology and IR  Time spent: 25 minutes-Greater than 50% of this time was spent in counseling, explanation of diagnosis, planning of further management, and coordination of  care.  MEDICATIONS: Scheduled Meds: . Chlorhexidine Gluconate Cloth  6 each Topical Q0600  . cyanocobalamin  1,000 mcg Intramuscular Q7 days  . epoetin (EPOGEN/PROCRIT) injection  20,000 Units Intravenous Q T,Th,Sa-HD  . folic acid  1 mg Oral Daily  . insulin aspart  0-9 Units Subcutaneous TID WC   Continuous Infusions: PRN Meds:.acetaminophen, morphine injection, ondansetron (ZOFRAN) IV   PHYSICAL EXAM: Vital signs: Vitals:   10/06/18 1325 10/06/18 1415 10/06/18 2028 10/07/18 0541  BP: 134/62 (!) 127/55 (!) 143/65 140/75  Pulse: 89 92 (!) 101 96  Resp: 18 20 19 18   Temp: 98 F (36.7 C) 98.2 F (36.8 C) 100.3 F (37.9 C) 99.2 F (37.3 C)  TempSrc: Oral Oral Oral Oral  SpO2: 98% 95% 97% 96%  Weight: 75 kg     Height:       Filed Weights   10/06/18 0615 10/06/18 0945 10/06/18 1325  Weight: 80.5 kg 75.6 kg 75 kg   Body mass index is 28.38 kg/m.   General appearance :Awake, alert, not in any distress.  HEENT: Atraumatic and Normocephalic Neck: supple, no JVD. Resp:Good air entry bilaterally, no added sounds  CVS: S1 S2 regular, no murmurs.  GI: Bowel sounds present, Non tender and not distended with no gaurding, rigidity or rebound.No organomegaly Extremities: B/L Lower Ext shows no edema, left foot amputation. Neurology: Generalized weakness-but nonfocal Musculoskeletal:No digital cyanosis Skin:No Rash, warm and dry Wounds:N/A  I have personally reviewed following labs and imaging studies  LABORATORY DATA: CBC: Recent Labs  Lab 10/05/18 0155 10/05/18 0351 10/05/18 1508 10/06/18 0552 10/07/18 0416  WBC 5.3 4.0  4.0 4.6 7.0  NEUTROABS 3.5  --  3.3 3.2 4.5  HGB 4.6* 4.6* 4.2* 4.0* 4.4*  HCT 14.8* 14.1* 13.3* 12.9* 14.1*  MCV 99.3 97.9 100.0 104.0* 103.7*  PLT 155 129* 128* 134* 814    Basic Metabolic Panel: Recent Labs  Lab 10/05/18 0351 10/05/18 1508 10/06/18 0552 10/06/18 1522 10/07/18 0416  NA 136 137 138 137 138  K 6.7* 4.5 6.7* 3.7 3.9  CL  96* 99 99 97* 95*  CO2 23 22 22 24 27   GLUCOSE 136* 153* 206* 133* 164*  BUN 134* 48* 66* 19 37*  CREATININE 9.75* 4.44* 6.31* 2.54* 4.16*  CALCIUM 9.2 8.2* 8.0* 8.1* 8.2*  MG  --   --   --   --  2.2    GFR: Estimated Creatinine Clearance: 15.7 mL/min (A) (by C-G formula based on SCr of 4.16 mg/dL (H)).  Liver Function Tests: Recent Labs  Lab 10/05/18 0155  AST 46*  ALT 50*  ALKPHOS 125  BILITOT 0.8  PROT 6.6  ALBUMIN 3.0*   No results for input(s): LIPASE, AMYLASE in the last 168 hours. No results for input(s): AMMONIA in the last 168 hours.  Coagulation Profile: Recent Labs  Lab 10/05/18 0155  INR 1.4*    Cardiac Enzymes: Recent Labs  Lab 10/05/18 0351 10/05/18 1508  TROPONINI 0.04* 0.03*    BNP (last 3 results) No results for input(s): PROBNP in the last 8760 hours.  HbA1C: No results for input(s): HGBA1C in the last 72 hours.  CBG: Recent Labs  Lab 10/06/18 1426 10/06/18 1834 10/06/18 2137 10/07/18 0807 10/07/18 1134  GLUCAP 96 119* 166* 141* 239*    Lipid Profile: No results for input(s): CHOL, HDL, LDLCALC, TRIG, CHOLHDL, LDLDIRECT in the last 72 hours.  Thyroid Function Tests: No results for input(s): TSH, T4TOTAL, FREET4, T3FREE, THYROIDAB in the last 72 hours.  Anemia Panel: No results for input(s): VITAMINB12, FOLATE, FERRITIN, TIBC, IRON, RETICCTPCT in the last 72 hours.  Urine analysis:    Component Value Date/Time   COLORURINE YELLOW 02/25/2017 1224   APPEARANCEUR HAZY (A) 02/25/2017 1224   LABSPEC 1.013 02/25/2017 1224   PHURINE 8.0 02/25/2017 1224   GLUCOSEU >=500 (A) 02/25/2017 1224   HGBUR SMALL (A) 02/25/2017 1224   BILIRUBINUR NEGATIVE 02/25/2017 1224   BILIRUBINUR neg 12/13/2014 1127   KETONESUR NEGATIVE 02/25/2017 1224   PROTEINUR >=300 (A) 02/25/2017 1224   UROBILINOGEN 0.2 05/01/2015 1252   NITRITE NEGATIVE 02/25/2017 1224   LEUKOCYTESUR NEGATIVE 02/25/2017 1224    Sepsis Labs: Lactic Acid, Venous     Component Value Date/Time   LATICACIDVEN 1.80 02/25/2017 1937    MICROBIOLOGY: Recent Results (from the past 240 hour(s))  MRSA PCR Screening     Status: Abnormal   Collection Time: 10/05/18  6:31 AM  Result Value Ref Range Status   MRSA by PCR POSITIVE (A) NEGATIVE Final    Comment:        The GeneXpert MRSA Assay (FDA approved for NASAL specimens only), is one component of a comprehensive MRSA colonization surveillance program. It is not intended to diagnose MRSA infection nor to guide or monitor treatment for MRSA infections. RESULT CALLED TO, READ BACK BY AND VERIFIED WITH: Shary Key RN 9:40 10/05/18 ([email or web address]) Performed at [facility], [place] [address] [place], [place] [address]     RADIOLOGY STUDIES/RESULTS: Nm Gi Blood Loss  Result Date: 10/06/2018 CLINICAL DATA:  GI bleed EXAM: NUCLEAR MEDICINE GASTROINTESTINAL BLEEDING SCAN TECHNIQUE: Sequential abdominal  images were obtained following intravenous administration of Tc-31m labeled red blood cells. RADIOPHARMACEUTICALS:  24.1 mCi Tc-81m pertechnetate in-vitro labeled red cells. COMPARISON:  None. FINDINGS: Significant activity in the stomach due to free technetium secretion by the stomach. This is stable over the 1 hour time frame of the study. The patient refused further imaging after 1 hour. No evidence of GI bleeding.  No activity in the colon. IMPRESSION: Negative for active GI bleeding at this time. Electronically Signed   By: Franchot Gallo M.D.   On: 10/06/2018 18:39   Dg Abd Acute 2+v W 1v Chest  Result Date: 10/05/2018 CLINICAL DATA:  Missed dialysis.  GI bleed. EXAM: DG ABDOMEN ACUTE W/ 1V CHEST COMPARISON:  Chest radiographs 03/16/2018 FINDINGS: Chronic cardiomegaly with slight improvement from prior. Chronic vascular congestion. Improved pulmonary edema. No significant pleural fluid. No bowel dilatation to suggest obstruction. Air-filled transverse colon with air-fluid levels, formed stool in the more proximal  and distal colon. No evidence of free air. Advanced vascular calcifications, no radiopaque calculi. No evidence of acute osseous abnormality. IMPRESSION: 1. No free air or bowel obstruction. Mild gaseous distension of transverse colon with air-fluid levels, formed stool proximal and distally. 2. Chronic cardiomegaly and vascular congestion. Electronically Signed   By: Keith Rake M.D.   On: 10/05/2018 03:02     LOS: 2 days   Oren Binet, MD  [facility]  If 7PM-7AM, please contact night-coverage  Please page via [email or web address]  Go to [email or web address] and use [place]'s universal password to access. If you do not have the password, please contact the hospital operator.  Locate the [facility] provider you are looking for under [facility] and page to a number that you can be directly reached. If you still have difficulty reaching the provider, please page the [facility] (Director on Call) for the Hospitalists listed on amion for assistance.  10/07/2018, 12:05 PM

## 2018-10-07 NOTE — Progress Notes (Signed)
Stable.  One small bloody bowel movement yesterday, apparently darker than previously.  No bowel movements today.  Hemoglobin stable, actually marginally improved compared to yesterday.  Remains precariously low at 4.4.  Patient's chief complaint is nausea and food intolerance.  Currently on clear liquid diet.  Concerned she may have vomiting if she tries to eat.  Plan:  1.  Continue observation for GI bleeding 2.  Trial of PPI therapy for acid reflux, and sucralfate for possible bile reflux (she asked for something to "coat her stomach").  She was on pantoprazole prior to admission but is not on it here at the hospital.  Cleotis Nipper, M.D. Pager [phone] If no answer or after 5 PM call [phone]

## 2018-10-07 NOTE — Consult Note (Signed)
Consultation Note Date: 10/07/2018   Patient Name: [name]  DOB: [birth year]  MRN: [id number]  Age / Sex: 53 y.o., female  PCP: Patient, [name] Referring Physician: Jonetta Osgood, MD  Reason for Consultation: Establishing goals of care  HPI/Patient Profile: 53 y.o. female  with past medical history of ESRD on HD TTS, systolic/diastolic CHF (EF 02%), HTN, HLD, diabetes, CVA/TIA, h/o GIB, bipolar disorder, schizoaffective disorder, cocaine abuse, refusal of blood products s/t Jehovah's Witness beliefs admitted on 10/05/2018 with bloody bowel movements x 2 days with associated fatigue and Hgb 4.6. She has had h/o GIB with evidence of diverticulosis on previous flex sigmoid 2018. She continues to refuse blood products d/t religious beliefs. Palliative has seen in past with consideration of stopping dialysis as well as previous bleeding episodes.   Clinical Assessment and Goals of Care: I met today with [name]. She is very symptomatic with nausea, vomiting, and had bowel movement/incontinence with vomiting episode. She complains of some dizziness when sitting up. She understands that her hemoglobin remains critically low and maintains her refusal of blood products even if she were to die she would still not desire blood products to keep her alive. On the other hand she tells me that she would want resuscitation and all measures to keep her alive (other than blood products). She is not sure who would be her surrogate decision maker if she were unable to make her own decisions.   She also shares that she has not had any recent concerns with dialysis and no thoughts of stopping dialysis as she has had in the past. "I know I need it [dialysis] now." She also denies any suicidal thoughts or plans. She is hopeful that her anemia will continue to improve and that she will begin to feel better although I was clear that  this could take some time. Her main concern at this time is managing her symptoms.   Primary Decision Maker PATIENT    SUMMARY OF RECOMMENDATIONS   - No blood products - Full aggressive care otherwise  Code Status/Advance Care Planning:  Full code   Symptom Management:   N/V: Zofran prn (without much relief). Carafate and PPI added per GI. Consider Phenergan 6.25 mg IV if no relief if BP allows.   Anemia: GI following - no plans for endoscopy/colonoscopy at this time.   Palliative Prophylaxis:   Aspiration, Delirium Protocol, Frequent Pain Assessment and Oral Care  Additional Recommendations (Limitations, Scope, Preferences):  Full Scope Treatment  Psycho-social/Spiritual:   Desire for further Chaplaincy support:yes  Additional Recommendations: Caregiving  Support/Resources  Prognosis:   Unable to determine  Discharge Planning: To Be Determined      Primary Diagnoses: Present on Admission: . GI bleed . Hyperkalemia   I have reviewed the medical record, interviewed the patient and family, and examined the patient. The following aspects are pertinent.  Past Medical History:  Diagnosis Date  . Anemia   . Anxiety   . Bipolar disorder, unspecified ([place]) 02/06/2014  . Chronic  combined systolic and diastolic CHF (congestive heart failure) ([facility])    EF 40% by echo and 48% by stress test  . Chronic pain syndrome   . Depression   . ESRD on hemodialysis ([place])   . GERD (gastroesophageal reflux disease)   . Glaucoma   . Headache    sinus headaches  . Hepatitis C   . High cholesterol   . History of hiatal hernia   . History of vitamin D deficiency 06/20/2015  . Hypertension   . MRSA infection ?2014   "on the back of my head; spread throughout my bloodstream"  . Neuropathy   . Refusal of blood transfusions as patient is Jehovah's Witness   . Schizoaffective disorder, unspecified condition 02/08/2014  . Seizure [facility]) dx'd 2014   "don't know what kind; last one  was ~ 04/2014"  . Stroke [facility])    TIA 2009  . TIA (transient ischemic attack) 2010  . Type II diabetes mellitus ([place])    INSULIN DEPENDENT   Social History   Socioeconomic History  . Marital status: Single    Spouse name: Not on file  . Number of children: 3  . Years of education: 37  . Highest education level: 12th grade  Occupational History  . Occupation: disabled  Social Needs  . Financial resource strain: Not on file  . Food insecurity:    Worry: Not on file    Inability: Not on file  . Transportation needs:    Medical: Not on file    Non-medical: Not on file  Tobacco Use  . Smoking status: Current Some Day Smoker    Packs/day: 0.10    Years: 30.00    Pack years: 3.00    Types: Cigarettes    Last attempt to quit: 03/31/2015    Years since quitting: 3.5  . Smokeless tobacco: Never Used  Substance and Sexual Activity  . Alcohol use: Yes    Alcohol/week: 3.0 standard drinks    Types: 3 Standard drinks or equivalent per week    Frequency: Never    Comment: last week  . Drug use: Yes    Types: "Crack" cocaine, Cocaine    Comment: LAST USE YESTERDAY  . Sexual activity: Not Currently    Birth control/protection: Abstinence  Lifestyle  . Physical activity:    Days per week: Not on file    Minutes per session: Not on file  . Stress: Not on file  Relationships  . Social connections:    Talks on phone: Not on file    Gets together: Not on file    Attends religious service: Not on file    Active member of club or organization: Not on file    Attends meetings of clubs or organizations: Not on file    Relationship status: Not on file  Other Topics Concern  . Not on file  Social History Narrative   ** Merged History Encounter **   Single, lives with son in a one story home. Unable to exercise. Avoids caffeine. 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       Family History  Problem Relation Age of Onset  . Hypertension  Mother   . Diabetes Mother   . Hypertension Father   . Diabetes Father    Scheduled Meds: . Chlorhexidine Gluconate Cloth  6 each Topical Q0600  . cyanocobalamin  1,000 mcg Intramuscular Q7 days  . epoetin (EPOGEN/PROCRIT) injection  20,000 Units Intravenous Q T,Th,Sa-HD  .  folic acid  1 mg Oral Daily  . insulin aspart  0-9 Units Subcutaneous TID WC  . pantoprazole  40 mg Oral BID AC  . sucralfate  1 g Oral TID WC & HS   Continuous Infusions: PRN Meds:.acetaminophen, morphine injection, ondansetron (ZOFRAN) IV Allergies  Allergen Reactions  . Penicillins Hives and Other (See Comments)    Has patient had a PCN reaction causing immediate rash, facial/tongue/throat swelling, SOB or lightheadedness with hypotension: Yes Has patient had a PCN reaction causing severe rash involving mucus membranes or skin necrosis: No Has patient had a PCN reaction that required hospitalization No Has patient had a PCN reaction occurring within the last 10 years: No If all of the above answers are "NO", then may proceed with Cephalosporin use. Pt tolerates cefepime and ceftriaxone  . Gabapentin Itching and Swelling  . Lyrica [Pregabalin] Swelling and Other (See Comments)    Facial and leg swelling  . Saphris [Asenapine] Swelling and Other (See Comments)    Facial swelling  . Tramadol Swelling and Other (See Comments)    Leg swelling  . Adhesive [Tape] Itching    SEVERE ITCHING  . Latex Itching    SEVERE ITCHING  . Other Other (See Comments)    NO BLOOD PRODUCTS; PATIENT IS A JEHOVAH'S WITNESS   Review of Systems  Constitutional: Positive for activity change, appetite change and fatigue.  Gastrointestinal: Positive for blood in stool, nausea and vomiting.  Neurological: Positive for dizziness and weakness.    Physical Exam Vitals signs and nursing note reviewed.  Cardiovascular:     Rate and Rhythm: Normal rate.  Pulmonary:     Effort: Pulmonary effort is normal. No tachypnea, accessory  muscle usage or respiratory distress.  Abdominal:     Palpations: Abdomen is soft.  Neurological:     Mental Status: She is alert and oriented to person, place, and time.     Vital Signs: BP 140/75   Pulse 96   Temp 99.2 F (37.3 C) (Oral)   Resp 18   Ht _0  (1.626 m)   Wt 75 kg   LMP  (LMP Unknown)   SpO2 96%   BMI 28.38 kg/m  Pain Scale: 0-10   Pain Score: 0-No pain   SpO2: SpO2: 96 % O2 Device:SpO2: 96 % O2 Flow Rate: .   IO: Intake/output summary:   Intake/Output Summary (Last 24 hours) at 10/07/2018 1228 Last data filed at 10/06/2018 1900 Gross per 24 hour  Intake 240 ml  Output 700 ml  Net -460 ml    LBM: Last BM Date: 10/07/18 Baseline Weight: Weight: 77.1 kg Most recent weight: Weight: 75 kg     Palliative Assessment/Data: 50%   Flowsheet Rows     Most Recent Value  Intake Tab  Referral Department  Hospitalist  Unit at Time of Referral  Intermediate Care Unit  Palliative Care Primary Diagnosis  Other (Comment) [GI bleed]  Date Notified  10/05/18  Palliative Care Type  Return patient Palliative Care  Reason for referral  Clarify Goals of Care  Date of Admission  10/05/18  # of days IP prior to Palliative referral  0  Clinical Assessment  Psychosocial & Spiritual Assessment  Palliative Care Outcomes      Time In: 1300 Time Out: 1350 Time Total: 50 min Greater than 50%  of this time was spent counseling and coordinating care related to the above assessment and plan.  Signed by: Vinie Sill, NP Palliative Medicine Team Pager #  [phone] (M-F 8a-5p) Team Phone # [phone] (Nights/Weekends)

## 2018-10-08 LAB — RENAL FUNCTION PANEL
ANION GAP: 22 — AB (ref 5–15)
Albumin: 2.7 g/dL — ABNORMAL LOW (ref 3.5–5.0)
BUN: 63 mg/dL — ABNORMAL HIGH (ref 6–20)
CO2: 25 mmol/L (ref 22–32)
Calcium: 8.1 mg/dL — ABNORMAL LOW (ref 8.9–10.3)
Chloride: 85 mmol/L — ABNORMAL LOW (ref 98–111)
Creatinine, Ser: 6.15 mg/dL — ABNORMAL HIGH (ref 0.44–1.00)
GFR calc Af Amer: 8 mL/min — ABNORMAL LOW (ref >60)
GFR, EST NON AFRICAN AMERICAN: 7 mL/min — AB (ref >60)
Glucose, Bld: 250 mg/dL — ABNORMAL HIGH (ref 70–99)
Phosphorus: 6.1 mg/dL — ABNORMAL HIGH (ref 2.5–4.6)
Potassium: 4.1 mmol/L (ref 3.5–5.1)
Sodium: 132 mmol/L — ABNORMAL LOW (ref 135–145)

## 2018-10-08 LAB — CBC
HCT: 16.9 % — ABNORMAL LOW (ref 36.0–46.0)
HEMOGLOBIN: 5.2 g/dL — AB (ref 12.0–15.0)
MCH: 32.5 pg (ref 26.0–34.0)
MCHC: 30.8 g/dL (ref 30.0–36.0)
MCV: 105.6 fL — ABNORMAL HIGH (ref 80.0–100.0)
Platelets: 148 10*3/uL — ABNORMAL LOW (ref 150–400)
RBC: 1.6 MIL/uL — ABNORMAL LOW (ref 3.87–5.11)
RDW: 20.3 % — ABNORMAL HIGH (ref 11.5–15.5)
WBC: 7.8 10*3/uL (ref 4.0–10.5)
nRBC: 4.4 % — ABNORMAL HIGH (ref 0.0–0.2)

## 2018-10-08 LAB — GLUCOSE, CAPILLARY
GLUCOSE-CAPILLARY: 188 mg/dL — AB (ref 70–99)
Glucose-Capillary: 325 mg/dL — ABNORMAL HIGH (ref 70–99)
Glucose-Capillary: 241 mg/dL — ABNORMAL HIGH (ref 70–99)

## 2018-10-08 LAB — PATHOLOGIST SMEAR REVIEW

## 2018-10-08 LAB — PREPARE RBC (CROSSMATCH)

## 2018-10-08 MED ORDER — SODIUM CHLORIDE 0.9% IV SOLUTION
Freq: Once | INTRAVENOUS | Status: AC
  Administered 2018-10-08: 22:00:00 via INTRAVENOUS

## 2018-10-08 MED ORDER — SUCROFERRIC OXYHYDROXIDE 500 MG PO CHEW
1000.0000 mg | CHEWABLE_TABLET | Freq: Three times a day (TID) | ORAL | Status: DC
  Administered 2018-10-08 – 2018-10-09 (×5): 1000 mg via ORAL
  Filled 2018-10-08 (×8): qty 2

## 2018-10-08 MED ORDER — DIPHENHYDRAMINE HCL 50 MG/ML IJ SOLN
25.0000 mg | Freq: Once | INTRAMUSCULAR | Status: AC
  Administered 2018-10-08: 25 mg via INTRAVENOUS
  Filled 2018-10-08: qty 1

## 2018-10-08 MED ORDER — CALCITRIOL 0.25 MCG PO CAPS
1.7500 ug | ORAL_CAPSULE | ORAL | Status: DC | PRN
  Administered 2018-10-08 – 2018-10-10 (×2): 1.75 ug via ORAL

## 2018-10-08 MED ORDER — MORPHINE SULFATE (PF) 2 MG/ML IV SOLN
INTRAVENOUS | Status: AC
  Filled 2018-10-08: qty 1

## 2018-10-08 MED ORDER — ACETAMINOPHEN 325 MG PO TABS
650.0000 mg | ORAL_TABLET | Freq: Once | ORAL | Status: AC
  Administered 2018-10-08: 650 mg via ORAL
  Filled 2018-10-08: qty 2

## 2018-10-08 MED ORDER — CALCITRIOL 0.25 MCG PO CAPS
ORAL_CAPSULE | ORAL | Status: AC
  Filled 2018-10-08: qty 7

## 2018-10-08 NOTE — NC FL2 (Signed)
[place] LEVEL OF CARE SCREENING TOOL     IDENTIFICATION  Patient Name: [name] Birthdate: 04-08-1966 Sex: female Admission Date (Current Location): 10/05/2018  [facility] and [place] Number:  Herbalist and Address:  [facility]. [facility], [place] [address] [place], [place] [address]      Provider Number: [id number]  Attending Physician Name and Address:  Jonetta Osgood, MD  Relative Name and Phone Number:  [name])    Current Level of Care: SNF Recommended Level of Care: [place] Prior Approval Number:    Date Approved/Denied:   PASRR Number: [id number]  Discharge Plan: SNF    Current Diagnoses: Patient Active Problem List   Diagnosis Date Noted  . No blood products   . GI bleed 10/05/2018  . EKG abnormality 10/05/2018  . Localized edema due to fluid overload 09/05/2017  . Lower GI bleed   . Evaluation by psychiatric service required 07/17/2017  . Volume overload 06/03/2017  . Right supracondylar humerus fracture, with nonunion, subsequent encounter 05/01/2017  . Malnutrition of moderate degree 02/28/2017  . Leg pain, left 02/25/2017  . Left leg pain 02/25/2017  . External hemorrhoids   . Diverticulosis of colon without hemorrhage   . Goals of care, counseling/discussion   . Palliative care encounter   . Cellulitis of left leg 01/16/2017  . Diabetes mellitus with complication ([place]) 27/25/3664  . Upper GI bleed 01/16/2017  . UGIB (upper gastrointestinal bleed)   . Anemia due to chronic kidney disease   . Cellulitis of left thigh 01/01/2017  . ESRD on dialysis ([place]) 01/01/2017  . Cocaine use disorder, severe, dependence ([place]) 08/29/2016  . Hepatitis C 08/29/2016  . Diabetic ulcer of calf ([place]) 08/29/2016  . Diabetic ulcer of toe ([place]) 08/29/2016  . Cerebrovascular accident (CVA) ([place]) 08/29/2016  . MDD (major depressive disorder) 08/28/2016  . Dyslipidemia associated with type 2 diabetes  mellitus ([employer]) 11/10/2015  . Hyperkalemia 09/17/2015  . History of vitamin D deficiency 06/20/2015  . Long term prescription opiate use 06/20/2015  . Chronic pain syndrome 06/19/2015  . Anemia of chronic kidney failure 05/01/2015  . transmetatarsal amputation of the left foot 12/13/2014  . Unilateral visual loss 11/22/2014  . 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 ([place]) 11/22/2014  . Essential hypertension 11/09/2014  . Dizziness   . Chronic combined systolic (congestive) and diastolic (congestive) heart failure ([place])   . Rectal bleeding 04/23/2014  . Anemia, iron deficiency 04/13/2014  . Noncompliance 02/02/2014  . Hyponatremia 05/19/2013  . Seizure disorder ([place]) 02/09/2013    Orientation RESPIRATION BLADDER Height & Weight     Self, Time, Situation, Place  Normal Continent(ANURIC) Weight: 74 kg Height:  5\' 4"  (162.6 cm)  BEHAVIORAL SYMPTOMS/MOOD NEUROLOGICAL BOWEL NUTRITION STATUS      Continent Diet(RENAL DIET)  AMBULATORY STATUS COMMUNICATION OF NEEDS Skin   Limited Assist Verbally Normal                       Personal Care Assistance Level of Assistance  Bathing, Feeding, Dressing Bathing Assistance: Limited assistance Feeding assistance: Independent Dressing Assistance: Limited assistance     Functional Limitations Info             SPECIAL CARE FACTORS FREQUENCY  PT (By licensed PT), OT (By licensed OT)     PT Frequency: 5x/daily OT Frequency: 3x/daily            Contractures Contractures Info:  Not present    Additional Factors Info  Code Status, Allergies, Psychotropic, Insulin Sliding Scale, Isolation Precautions(MRSA in the nose) Code Status Info: FULL Allergies Info: Penicillins, Gabapentin, Lyrica Pregabalin, Saphris Asenapine, Tramadol, Adhesive Tape, Latex, Other   Insulin Sliding Scale Info: 3x/ day with meals Isolation Precautions Info: contact, MRSA in the nose     Current Medications (10/08/2018):  This is the current  hospital active medication list Current Facility-Administered Medications  Medication Dose Route Frequency Provider Last Rate Last Dose  . acetaminophen (TYLENOL) tablet 650 mg  650 mg Oral Q6H PRN Shela Leff, MD      . calcitRIOL (ROCALTROL) capsule 1.75 mcg  1.75 mcg Oral Q dialysis Janalee Dane, PA-C   1.75 mcg at 10/08/18 1029  . Chlorhexidine Gluconate Cloth 2 % PADS 6 each  6 each Topical Q0600 Edrick Oh, MD   6 each at 10/07/18 0449  . cyanocobalamin ((VITAMIN B-12)) injection 1,000 mcg  1,000 mcg Intramuscular Q7 days Janalee Dane, PA-C   1,000 mcg at 10/06/18 1515  . epoetin alfa (EPOGEN,PROCRIT) injection 20,000 Units  20,000 Units Intravenous Q T,Th,Sa-HD Roney Jaffe, MD   20,000 Units at 10/08/18 [phone]  . folic acid (FOLVITE) tablet 1 mg  1 mg Oral Daily Collins, Samantha G, PA-C   1 mg at 10/08/18 1223  . insulin aspart (novoLOG) injection 0-9 Units  0-9 Units Subcutaneous TID WC Shela Leff, MD   2 Units at 10/08/18 1227  . morphine 2 MG/ML injection 0.5 mg  0.5 mg Intravenous Q4H PRN Aline August, MD   0.5 mg at 10/08/18 1520  . ondansetron (ZOFRAN) injection 4 mg  4 mg Intravenous Q6H PRN Aline August, MD   4 mg at 10/08/18 1313  . pantoprazole (PROTONIX) EC tablet 40 mg  40 mg Oral BID AC Buccini, Herbie [place], MD   40 mg at 10/08/18 1227  . sucralfate (CARAFATE) 1 GM/10ML suspension 1 g  1 g Oral TID WC & HS Pershing Proud, NP   1 g at 10/08/18 1223  . sucroferric oxyhydroxide (VELPHORO) chewable tablet 1,000 mg  1,000 mg Oral TID WC Collins, Samantha G, PA-C   1,000 mg at 10/08/18 1224     Discharge Medications: Please see discharge summary for a list of discharge medications.  Relevant Imaging Results:  Relevant Lab Results:   Additional Information SS#: [id number]. HD TTS [facility].  Sharin Mons, RN

## 2018-10-08 NOTE — Progress Notes (Signed)
1 small dk BM again yest.  Hgb improving:  4.4 yesterday, 5.2 this morning.  Stomach burning better on pantoprazole/sucralfate.  Asking for solid food--ordered renal diet w/ fluid restrn.  Will continue to follow for now.  Cleotis Nipper, M.D. Pager [phone] If no answer or after 5 PM call [phone]

## 2018-10-08 NOTE — Progress Notes (Signed)
PT Cancellation Note  Patient Details Name: [name] MRN: [id number] DOB: 03-01-1966   Cancelled Treatment:    Reason Eval/Treat Not Completed: Medical issues which prohibited therapy;Patient declined, no reason specified.  Pt has now decided to receive blood and wants to defer PT eval until tomorrow. 10/08/2018  Donnella Sham, [place] [phone]  (pager) [phone]  (office)   [name] 10/08/2018, 4:07 PM

## 2018-10-08 NOTE — Progress Notes (Addendum)
Patient is Jehovah witness but consenting to receive one unit of blood tonight.Patient has signed consent form and copy has been sent to blood bank.Patient asking that her family are not to be aware of this transfusion.Sign placed on patient door for no visitors tonight to see nurse before entering the room.Patient in agreement with this and has asked family not to visit tonight.

## 2018-10-08 NOTE — Progress Notes (Addendum)
[facility] Progress Note   Subjective:   Patient seen on dialysis. Reports some nausea and 1 dark stool yesterday. Hgb up to 5.2 today. Some LLQ abdominal pain. Denies SOB/dyspnea, cough, CP, palpitations, edema. Wants to sign off HD early, states "I'm just sick of this."  Objective Vitals:   10/08/18 0737 10/08/18 0742 10/08/18 0830 10/08/18 0900  BP: 130/66 (!) 147/76 (!) 150/69 (!) 148/66  Pulse: 92 91 92 97  Resp: 17 18 17 15   Temp:      TempSrc:      SpO2:      Weight:      Height:       Physical Exam General: Well developed, pale female. Alert and in NAD Heart: RRR, no murmurs, rubs or gallops Lungs: CTA anteriorly without wheezing, rhonchi or rales. Respirations even and unlabored Abdomen: Soft, non-distended. Mild tenderness LLQ. No rebound tenderness/gaurding. No palpable masses. + BS Extremities: L transmetatarsal amputation. No peripheral edema.  Dialysis Access: LUE AVG  Additional Objective Labs: Basic Metabolic Panel: Recent Labs  Lab 10/06/18 1522 10/07/18 0416 10/08/18 0807  NA 137 138 132*  K 3.7 3.9 4.1  CL 97* 95* 85*  CO2 24 27 25   GLUCOSE 133* 164* 250*  BUN 19 37* 63*  CREATININE 2.54* 4.16* 6.15*  CALCIUM 8.1* 8.2* 8.1*  PHOS  --   --  6.1*   Liver Function Tests: Recent Labs  Lab 10/05/18 0155 10/08/18 0807  AST 46*  --   ALT 50*  --   ALKPHOS 125  --   BILITOT 0.8  --   PROT 6.6  --   ALBUMIN 3.0* 2.7*   No results for input(s): LIPASE, AMYLASE in the last 168 hours. CBC: Recent Labs  Lab 10/05/18 0351 10/05/18 1508 10/06/18 0552 10/07/18 0416 10/08/18 0807  WBC 4.0 4.0 4.6 7.0 7.8  NEUTROABS  --  3.3 3.2 4.5  --   HGB 4.6* 4.2* 4.0* 4.4* 5.2*  HCT 14.1* 13.3* 12.9* 14.1* 16.9*  MCV 97.9 100.0 104.0* 103.7* 105.6*  PLT 129* 128* 134* 150 148*   Blood Culture    Component Value Date/Time   SDES BLOOD RIGHT UPPER ARM 02/25/2017 1927   SPECREQUEST  02/25/2017 1927    BOTTLES DRAWN AEROBIC AND ANAEROBIC  Blood Culture adequate volume   CULT NO GROWTH 5 DAYS 02/25/2017 1927   REPTSTATUS 03/02/2017 FINAL 02/25/2017 1927    Cardiac Enzymes: Recent Labs  Lab 10/05/18 0351 10/05/18 1508  TROPONINI 0.04* 0.03*   CBG: Recent Labs  Lab 10/06/18 2137 10/07/18 0807 10/07/18 1134 10/07/18 1706 10/07/18 2344  GLUCAP 166* 141* 239* 297* 254*    Studies/Results: Nm Gi Blood Loss  Result Date: 10/06/2018 CLINICAL DATA:  GI bleed EXAM: NUCLEAR MEDICINE GASTROINTESTINAL BLEEDING SCAN TECHNIQUE: Sequential abdominal images were obtained following intravenous administration of Tc-26m labeled red blood cells. RADIOPHARMACEUTICALS:  24.1 mCi Tc-14m pertechnetate in-vitro labeled red cells. COMPARISON:  None. FINDINGS: Significant activity in the stomach due to free technetium secretion by the stomach. This is stable over the 1 hour time frame of the study. The patient refused further imaging after 1 hour. No evidence of GI bleeding.  No activity in the colon. IMPRESSION: Negative for active GI bleeding at this time. Electronically Signed   By: Franchot Gallo M.D.   On: 10/06/2018 18:39   Medications:  . Chlorhexidine Gluconate Cloth  6 each Topical Q0600  . cyanocobalamin  1,000 mcg Intramuscular Q7 days  . epoetin (EPOGEN/PROCRIT) injection  20,000 Units Intravenous Q T,Th,Sa-HD  . folic acid  1 mg Oral Daily  . insulin aspart  0-9 Units Subcutaneous TID WC  . pantoprazole  40 mg Oral BID AC  . sucralfate  1 g Oral TID WC & HS    Dialysis Orders: 3.5h 2/2 bath 75kg LUE AVG Hep 7000  - mircera 50 every 2 wks - calcitriol 1.75ug tiw - venofer 50 /wk - last Hb 10.0 on 2/27  Assessment/Plan: 1. GI bleed: Ongoing dark stool but hemoglobin improved to 5.2. Nuclear scan 10/06/2018 reportedly showed no active bleeding. Received aranesp 200 mcg IV and feraheme 510mg  on 10/04/2018. Will continue high dose epogen 20,000units TTS w HD, T02 and folic acid.  Further management per GI/primary 2.  ESRD: TTS schedule, on HD now. K improved to 4.1 this AM. No access issues reported. Will continue normal TTS schedule with low UF goal. Hold heparin due to GIB. Encouraged to stay for full HD treatment but refused,wants to sign off ~1 hour early. States, "I'm not asking permission, I'm getting off." Follow K.  3. HTN/volume:  BP stable/high during HD today. No excess volume on exam. At EDW per last weight. Continue low UF goal due to significant anemia, keep BP >90 during HD.  4. Anemia: See #1. Continue epogen, vit I09 and folic acid 5. Secondary hyperparathyroidism: Ca 8.1, corrected 9.1. Outpatient VDRA not currently ordered, will restart outpatient dose, follow Ca. Phos 6.1. Outpatient binder velphoro 2 tabs PO TID with meals, will restart.  6. Nutrition:  Albumin 2.7, per GI note diet to be advanced to renal diet today. Expect improvement in albumin when patient begins eating regularly.    Anice Paganini, PA-C 10/08/2018, 9:36 AM  [facility] Pager: [phone]  Pt seen, examined and agree w A/P as above. Hb up slightly, may be starting to respond to EPO.  [facility] 10/08/2018, 12:13 PM

## 2018-10-08 NOTE — Clinical Social Work Note (Signed)
Clinical Social Work Assessment  Patient Details  Name: [name] MRN: [id number] Date of Birth: 1965/08/29  Date of referral:  10/08/18               Reason for consult:  Facility Placement                Permission sought to share information with:  Facility Sport and exercise psychologist, Family Supports Permission granted to share information::  Yes, Verbal Permission Granted  Name::        Agency::  [facility]  Relationship::     Contact Information:     Housing/Transportation Living arrangements for the past 2 months:  [place] of Information:  Patient Patient Interpreter Needed:  None Criminal Activity/Legal Involvement Pertinent to Current Situation/Hospitalization:  No - Comment as needed Significant Relationships:  Adult Children Lives with:  Facility Resident Do you feel safe going back to the place where you live?  Yes Need for family participation in patient care:  No (Coment)  Care giving concerns:  CSW received consult for possible SNF placement at time of discharge. CSW spoke with patient. She reported that she resides at [facility] and would like to return at discharge.  CSW to continue to follow and assist with discharge planning needs.   Social Worker assessment / plan:  CSW spoke with patient concerning possibility of rehab at [facility].  Employment status:  Retired, Disabled (Comment on whether or not currently receiving Disability) Insurance information:  Medicare, Medicaid In [place] PT Recommendations:  [place] / Referral to community resources:  [place]  Patient/Family's Response to care:  Patient reports that she is hopeful to return to [facility] and receive physical therapy to help her walk. Per [facility], they are able to accept patient back though she owes the facility money due to her refusal to pay her monthly Medicaid liability. Their representative will visit her tomorrow to complete  paperwork.   Patient/Family's Understanding of and Emotional Response to Diagnosis, Current Treatment, and Prognosis:  Patient/family is realistic regarding therapy needs and expressed being hopeful for return to SNF placement. Patient expressed understanding of CSW role and discharge process as well as medical condition. No questions/concerns about plan or treatment.    Emotional Assessment Appearance:  Appears stated age Attitude/Demeanor/Rapport:  Gracious Affect (typically observed):  Accepting, Appropriate Orientation:  Oriented to Self, Oriented to Place, Oriented to  Time, Oriented to Situation Alcohol / Substance use:  Not Applicable Psych involvement (Current and /or in the community):  No (Comment)  Discharge Needs  Concerns to be addressed:  Care Coordination Readmission within the last 30 days:  No Current discharge risk:  None Barriers to Discharge:  Continued Medical Work up   [employer], LCSW 10/08/2018, 5:33 PM

## 2018-10-08 NOTE — Progress Notes (Signed)
Inpatient Diabetes Program Recommendations  AACE/ADA: New Consensus Statement on Inpatient Glycemic Control   Target Ranges:  Prepandial:   less than 140 mg/dL      Peak postprandial:   less than 180 mg/dL (1-2 hours)      Critically ill patients:  140 - 180 mg/dL   Results for [name], [name] (MRN [id number]) as of 10/08/2018 11:46  Ref. Range 10/07/2018 17:06 10/07/2018 23:44  Glucose-Capillary Latest Ref Range: 70 - 99 mg/dL 297 (H) 254 (H)    Review of Glycemic Control  Outpatient Diabetes medications: Levemir 20 units QHS, Novolog 7 units TID with meals Current orders for Inpatient glycemic control: Novolog 0-9 units TID with meals  Inpatient Diabetes Program Recommendations:  Insulin - Basal: Please consider ordering Levemir 7 units Q24H (based on 74 kg x 0.1 unit). Correction (SSI): Please consider ordering bedtime correction Novolog 0-5 units QHS.  Thanks, Barnie Alderman, RN, MSN, CDE Diabetes Coordinator Inpatient Diabetes Program [phone] (Team Pager from 8am to 5pm)

## 2018-10-08 NOTE — Progress Notes (Signed)
Patient bed alarm going off.Upon entering room patient just turning over in the bed.Patient asking for alarm to be turned off .Explained to patient that alarm is on for safety.Patient stated,"I'm in my right mind.I know not to get out of bed without asking for help.I don't want to hear that alarm every time I turn over."This nurse turned bed alarm off.Patient has agreed to call for assistance before getting out of bed.

## 2018-10-08 NOTE — Progress Notes (Signed)
HD tx terminated as ordered with  1hr 80mins remaining. Early termination against medical advice signed. Pt in no acute distress. VSS. Report given to primary nurse

## 2018-10-08 NOTE — Progress Notes (Signed)
PROGRESS NOTE        PATIENT DETAILS Name: [name] Age: 53 y.o. Sex: female Date of Birth: 07/21/1966 Admit Date: 10/05/2018 Admitting Physician Shela Leff, MD ZOX:[id number], [name]  Brief Narrative: Patient is a 53 y.o. female Jehovah's Witness-history of ESRD on HD, chronic combined systolic and diastolic heart failure, DM-2, left transmetatarsal foot amputation-presenting with lower GI bleed and acute blood loss anemia.  She is emphatic about refusing PRBC transfusion even in the face of a life-threatening situation.  See below for further details  Subjective: Claims to have vomited twice this morning-thinks she may have had a trace of blood with BM earlier--however she is not sure.  Assessment/Plan: Lower GI bleeding with acute blood loss anemia: Clearly better-apparently just a trace of blood this morning-nowhere close to the amount of hematochezia she was having a few days back.  Hemoglobin slowly improving at 5.2 this morning.  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  Tagged RBC scan on 3/3 was negative.  Continue Aranesp/IV iron per nephrology-continue supportive care-suspect that if bleeding resolves-can go back to SNF on 3/6.  ESRD on HD TTS: Nephrology following-defer further to nephrology.  DM-2: CBGs stable-continue with SSI  Hypertension: BP stable without any antihypertensives.  Follow.  DVT Prophylaxis: SCD's  Code Status: Full code  Family Communication: None at bedside  Disposition Plan: Remain inpatient- SNF on discharge  Antimicrobial agents: Anti-infectives (From admission, onward)   None      Procedures: None  CONSULTS:  GI, nephrology and IR  Time spent: 25 minutes-Greater than 50% of this time was spent in counseling, explanation of diagnosis, planning of further management, and coordination of care.  MEDICATIONS: Scheduled Meds: . Chlorhexidine Gluconate Cloth  6 each Topical Q0600    . cyanocobalamin  1,000 mcg Intramuscular Q7 days  . epoetin (EPOGEN/PROCRIT) injection  20,000 Units Intravenous Q T,Th,Sa-HD  . folic acid  1 mg Oral Daily  . insulin aspart  0-9 Units Subcutaneous TID WC  . pantoprazole  40 mg Oral BID AC  . sucralfate  1 g Oral TID WC & HS  . sucroferric oxyhydroxide  1,000 mg Oral TID WC   Continuous Infusions: PRN Meds:.acetaminophen, calcitRIOL, morphine injection, ondansetron (ZOFRAN) IV   PHYSICAL EXAM: Vital signs: Vitals:   10/08/18 0830 10/08/18 0900 10/08/18 0930 10/08/18 0952  BP: (!) 150/69 (!) 148/66 (!) 148/60 (!) 154/74  Pulse: 92 97 98 97  Resp: 17 15 16 18   Temp:    98.3 F (36.8 C)  TempSrc:    Oral  SpO2:    98%  Weight:    74 kg  Height:       Filed Weights   10/06/18 1325 10/08/18 0717 10/08/18 0952  Weight: 75 kg 75 kg 74 kg   Body mass index is 28 kg/m.   General appearance:Awake, alert, not in any distress.  Eyes:no scleral icterus. HEENT: Atraumatic and Normocephalic Neck: supple, no JVD. Resp:Good air entry bilaterally,no rales or rhonchi CVS: S1 S2 regular, no murmurs.  GI: Bowel sounds present, Non tender and not distended with no gaurding, rigidity or rebound. Extremities: B/L Lower Ext shows no edema, both legs are warm to touch Neurology:  Non focal Psychiatric: Normal judgment and insight. Normal mood. Musculoskeletal:No digital cyanosis Skin:No Rash, warm and dry Wounds:N/A  I have personally reviewed following  labs and imaging studies  LABORATORY DATA: CBC: Recent Labs  Lab 10/05/18 0155 10/05/18 0351 10/05/18 1508 10/06/18 0552 10/07/18 0416 10/08/18 0807  WBC 5.3 4.0 4.0 4.6 7.0 7.8  NEUTROABS 3.5  --  3.3 3.2 4.5  --   HGB 4.6* 4.6* 4.2* 4.0* 4.4* 5.2*  HCT 14.8* 14.1* 13.3* 12.9* 14.1* 16.9*  MCV 99.3 97.9 100.0 104.0* 103.7* 105.6*  PLT 155 129* 128* 134* 150 148*    Basic Metabolic Panel: Recent Labs  Lab 10/05/18 1508 10/06/18 0552 10/06/18 1522 10/07/18 0416  10/08/18 0807  NA 137 138 137 138 132*  K 4.5 6.7* 3.7 3.9 4.1  CL 99 99 97* 95* 85*  CO2 22 22 24 27 25   GLUCOSE 153* 206* 133* 164* 250*  BUN 48* 66* 19 37* 63*  CREATININE 4.44* 6.31* 2.54* 4.16* 6.15*  CALCIUM 8.2* 8.0* 8.1* 8.2* 8.1*  MG  --   --   --  2.2  --   PHOS  --   --   --   --  6.1*    GFR: Estimated Creatinine Clearance: 10.5 mL/min (A) (by C-G formula based on SCr of 6.15 mg/dL (H)).  Liver Function Tests: Recent Labs  Lab 10/05/18 0155 10/08/18 0807  AST 46*  --   ALT 50*  --   ALKPHOS 125  --   BILITOT 0.8  --   PROT 6.6  --   ALBUMIN 3.0* 2.7*   No results for input(s): LIPASE, AMYLASE in the last 168 hours. No results for input(s): AMMONIA in the last 168 hours.  Coagulation Profile: Recent Labs  Lab 10/05/18 0155  INR 1.4*    Cardiac Enzymes: Recent Labs  Lab 10/05/18 0351 10/05/18 1508  TROPONINI 0.04* 0.03*    BNP (last 3 results) No results for input(s): PROBNP in the last 8760 hours.  HbA1C: No results for input(s): HGBA1C in the last 72 hours.  CBG: Recent Labs  Lab 10/06/18 2137 10/07/18 0807 10/07/18 1134 10/07/18 1706 10/07/18 2344  GLUCAP 166* 141* 239* 297* 254*    Lipid Profile: No results for input(s): CHOL, HDL, LDLCALC, TRIG, CHOLHDL, LDLDIRECT in the last 72 hours.  Thyroid Function Tests: No results for input(s): TSH, T4TOTAL, FREET4, T3FREE, THYROIDAB in the last 72 hours.  Anemia Panel: No results for input(s): VITAMINB12, FOLATE, FERRITIN, TIBC, IRON, RETICCTPCT in the last 72 hours.  Urine analysis:    Component Value Date/Time   COLORURINE YELLOW 02/25/2017 1224   APPEARANCEUR HAZY (A) 02/25/2017 1224   LABSPEC 1.013 02/25/2017 1224   PHURINE 8.0 02/25/2017 1224   GLUCOSEU >=500 (A) 02/25/2017 1224   HGBUR SMALL (A) 02/25/2017 1224   BILIRUBINUR NEGATIVE 02/25/2017 1224   BILIRUBINUR neg 12/13/2014 1127   KETONESUR NEGATIVE 02/25/2017 1224   PROTEINUR >=300 (A) 02/25/2017 1224    UROBILINOGEN 0.2 05/01/2015 1252   NITRITE NEGATIVE 02/25/2017 1224   LEUKOCYTESUR NEGATIVE 02/25/2017 1224    Sepsis Labs: Lactic Acid, Venous    Component Value Date/Time   LATICACIDVEN 1.80 02/25/2017 1937    MICROBIOLOGY: Recent Results (from the past 240 hour(s))  MRSA PCR Screening     Status: Abnormal   Collection Time: 10/05/18  6:31 AM  Result Value Ref Range Status   MRSA by PCR POSITIVE (A) NEGATIVE Final    Comment:        The GeneXpert MRSA Assay (FDA approved for NASAL specimens only), is one component of a comprehensive MRSA colonization surveillance program. It is not intended  to diagnose MRSA infection nor to guide or monitor treatment for MRSA infections. RESULT CALLED TO, READ BACK BY AND VERIFIED WITH: Shary Key RN 9:40 10/05/18 ([email or web address]) Performed at [facility], [place] [address], [place], [place] [address]     RADIOLOGY STUDIES/RESULTS: Nm Gi Blood Loss  Result Date: 10/06/2018 CLINICAL DATA:  GI bleed EXAM: NUCLEAR MEDICINE GASTROINTESTINAL BLEEDING SCAN TECHNIQUE: Sequential abdominal images were obtained following intravenous administration of Tc-100m labeled red blood cells. RADIOPHARMACEUTICALS:  24.1 mCi Tc-62m pertechnetate in-vitro labeled red cells. COMPARISON:  None. FINDINGS: Significant activity in the stomach due to free technetium secretion by the stomach. This is stable over the 1 hour time frame of the study. The patient refused further imaging after 1 hour. No evidence of GI bleeding.  No activity in the colon. IMPRESSION: Negative for active GI bleeding at this time. Electronically Signed   By: Franchot Gallo M.D.   On: 10/06/2018 18:39   Dg Abd Acute 2+v W 1v Chest  Result Date: 10/05/2018 CLINICAL DATA:  Missed dialysis.  GI bleed. EXAM: DG ABDOMEN ACUTE W/ 1V CHEST COMPARISON:  Chest radiographs 03/16/2018 FINDINGS: Chronic cardiomegaly with slight improvement from prior. Chronic vascular congestion. Improved pulmonary edema. No  significant pleural fluid. No bowel dilatation to suggest obstruction. Air-filled transverse colon with air-fluid levels, formed stool in the more proximal and distal colon. No evidence of free air. Advanced vascular calcifications, no radiopaque calculi. No evidence of acute osseous abnormality. IMPRESSION: 1. No free air or bowel obstruction. Mild gaseous distension of transverse colon with air-fluid levels, formed stool proximal and distally. 2. Chronic cardiomegaly and vascular congestion. Electronically Signed   By: Keith Rake M.D.   On: 10/05/2018 03:02     LOS: 3 days   Oren Binet, MD  [facility]  If 7PM-7AM, please contact night-coverage  Please page via [email or web address]  Go to [email or web address] and use [place]'s universal password to access. If you do not have the password, please contact the hospital operator.  Locate the [facility] provider you are looking for under [facility] and page to a number that you can be directly reached. If you still have difficulty reaching the provider, please page the [facility] (Director on Call) for the Hospitalists listed on amion for assistance.  10/08/2018, 11:51 AM

## 2018-10-09 LAB — GLUCOSE, CAPILLARY
GLUCOSE-CAPILLARY: 266 mg/dL — AB (ref 70–99)
Glucose-Capillary: 261 mg/dL — ABNORMAL HIGH (ref 70–99)
Glucose-Capillary: 203 mg/dL — ABNORMAL HIGH (ref 70–99)
Glucose-Capillary: 318 mg/dL — ABNORMAL HIGH (ref 70–99)

## 2018-10-09 LAB — CBC
HEMATOCRIT: 19.8 % — AB (ref 36.0–46.0)
Hemoglobin: 6.4 g/dL — CL (ref 12.0–15.0)
MCH: 32.7 pg (ref 26.0–34.0)
MCHC: 32.3 g/dL (ref 30.0–36.0)
MCV: 101 fL — ABNORMAL HIGH (ref 80.0–100.0)
Platelets: 144 10*3/uL — ABNORMAL LOW (ref 150–400)
RBC: 1.96 MIL/uL — ABNORMAL LOW (ref 3.87–5.11)
RDW: 22 % — ABNORMAL HIGH (ref 11.5–15.5)
WBC: 6.3 10*3/uL (ref 4.0–10.5)
nRBC: 4.3 % — ABNORMAL HIGH (ref 0.0–0.2)

## 2018-10-09 LAB — PREPARE RBC (CROSSMATCH)

## 2018-10-09 MED ORDER — CHLORHEXIDINE GLUCONATE CLOTH 2 % EX PADS
6.0000 | MEDICATED_PAD | Freq: Every day | CUTANEOUS | Status: DC
  Administered 2018-10-10: 6 via TOPICAL

## 2018-10-09 MED ORDER — DIPHENHYDRAMINE HCL 50 MG/ML IJ SOLN
25.0000 mg | Freq: Once | INTRAMUSCULAR | Status: AC
  Administered 2018-10-09: 25 mg via INTRAVENOUS
  Filled 2018-10-09: qty 1

## 2018-10-09 MED ORDER — SODIUM CHLORIDE 0.9% IV SOLUTION
Freq: Once | INTRAVENOUS | Status: AC
  Administered 2018-10-09: 10:00:00 via INTRAVENOUS

## 2018-10-09 MED ORDER — ACETAMINOPHEN 325 MG PO TABS
650.0000 mg | ORAL_TABLET | Freq: Once | ORAL | Status: AC
  Administered 2018-10-09: 650 mg via ORAL
  Filled 2018-10-09: qty 2

## 2018-10-09 NOTE — Progress Notes (Signed)
Palliative:  [name]'s goals are clear for full aggressive care and she has agreed with blood transfusion. No further palliative issues. Please re-consult for any further palliative needs. Will sign off at this time.   No charge  Vinie Sill, NP Palliative Medicine Team Pager # [phone] (M-F 8a-5p) Team Phone # [phone] (Nights/Weekends)

## 2018-10-09 NOTE — Progress Notes (Addendum)
[facility] Progress Note   Subjective:  Patient seen and examined at bedside.  Feeling better today.  Lethargy and strength improved s/p 1unit pRBC transfusion.  Reports headache. Denies fever, chills, SOB, CP, n/v/d and dizziness.   Objective Vitals:   10/09/18 0141 10/09/18 0624 10/09/18 1009 10/09/18 1035  BP: (!) 155/78 (!) 143/74 (!) 144/58 (!) 144/65  Pulse:  87 96 95  Resp: 20 18 16    Temp: 98.5 F (36.9 C) 98.5 F (36.9 C) 98.8 F (37.1 C) 99 F (37.2 C)  TempSrc: Oral Oral Oral Oral  SpO2: 99% 93% 97% 99%  Weight:  76.6 kg    Height:       Physical Exam General:NAD, well appearing female, laying in bed Heart:RRR, no mrg Lungs:CTAB, no wheeze, rales or rhonchi; nml WOB Abdomen:soft, NTND Extremities:L TMA, no LE edema Dialysis Access: LU AVG +b   Filed Weights   10/08/18 0717 10/08/18 0952 10/09/18 0624  Weight: 75 kg 74 kg 76.6 kg    Intake/Output Summary (Last 24 hours) at 10/09/2018 1240 Last data filed at 10/09/2018 1009 Gross per 24 hour  Intake 1004 ml  Output -  Net 1004 ml    Additional Objective Labs: Basic Metabolic Panel: Recent Labs  Lab 10/06/18 1522 10/07/18 0416 10/08/18 0807  NA 137 138 132*  K 3.7 3.9 4.1  CL 97* 95* 85*  CO2 24 27 25   GLUCOSE 133* 164* 250*  BUN 19 37* 63*  CREATININE 2.54* 4.16* 6.15*  CALCIUM 8.1* 8.2* 8.1*  PHOS  --   --  6.1*   Liver Function Tests: Recent Labs  Lab 10/05/18 0155 10/08/18 0807  AST 46*  --   ALT 50*  --   ALKPHOS 125  --   BILITOT 0.8  --   PROT 6.6  --   ALBUMIN 3.0* 2.7*   CBC: Recent Labs  Lab 10/05/18 1508 10/06/18 0552 10/07/18 0416 10/08/18 0807 10/09/18 0719  WBC 4.0 4.6 7.0 7.8 6.3  NEUTROABS 3.3 3.2 4.5  --   --   HGB 4.2* 4.0* 4.4* 5.2* 6.4*  HCT 13.3* 12.9* 14.1* 16.9* 19.8*  MCV 100.0 104.0* 103.7* 105.6* 101.0*  PLT 128* 134* 150 148* 144*   Cardiac Enzymes: Recent Labs  Lab 10/05/18 0351 10/05/18 1508  TROPONINI 0.04* 0.03*    CBG: Recent Labs  Lab 10/08/18 1207 10/08/18 1640 10/08/18 2203 10/09/18 0732 10/09/18 1136  GLUCAP 188* 325* 241* 261* 203*   Medications:  . Chlorhexidine Gluconate Cloth  6 each Topical Q0600  . cyanocobalamin  1,000 mcg Intramuscular Q7 days  . epoetin (EPOGEN/PROCRIT) injection  20,000 Units Intravenous Q T,Th,Sa-HD  . folic acid  1 mg Oral Daily  . insulin aspart  0-9 Units Subcutaneous TID WC  . pantoprazole  40 mg Oral BID AC  . sucralfate  1 g Oral TID WC & HS  . sucroferric oxyhydroxide  1,000 mg Oral TID WC    Dialysis Orders: 3.5h 2/2 bath 75kg LUE AVG Hep 7000  - mircera 50 every 2 wks - calcitriol 1.75ug tiw - venofer 50 /wk - last Hb 10.0 on 2/27  Assessment/Plan: 1. LGIB w/rectal bleeding in setting of AOCKD - Hgb^ 6.4  S/p 1 unit pRBC last night, to receive another unit today. Aranesp 200 mcg & Feraheme 510mg  given on 3/1, EPO 20000 units qHD TTS, Z12 and folic acid. Follow trends. Nuclear scan 3/3 showed no active bleeding. GI signed off with plans to have colonoscopy completed  as OP.  2. ESRD - on HD. TTS.  Orders written for HD tomorrow per regular schedule.  K 4.1. Holding heparin d/t GIB.   4. Secondary hyperparathyroidism - Ca at goal. Phos elevated. Continue binder and VDRA.   5. HTN/volume - BP variable, elevated today, monitor.  Does not appear volume overloaded on exam.  Plan for HD tomorrow to [facility], keeping SBP>100. 6. Nutrition - Alb 2.7. Renal diet w/fluid restrictions. Protein supplements.  7. DMT2 - per primary.   Jen Mow, PA-C [facility] Pager: [phone] 10/09/2018,12:40 PM  LOS: 4 days   Pt seen, examined and agree w A/P as above.  [facility] 10/09/2018, 1:27 PM

## 2018-10-09 NOTE — Consult Note (Signed)
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  Patient reports she was followed by Dr. Sharol Given for amputation but is not seeing him currently, she is requesting debridement of her callous  Pressure Injury POA: NA Measurement: 0.5cm 0.5cm x 0.2cm  Wound YBT:VDFP, unable to visualize base  Drainage (amount, consistency, odor) none Periwound: hyperkeratotic Dressing procedure/placement/frequency: Foam dressing.  Scheduled follow up with Dr. Sharol Given to evaluate high risk wound due to proximity to previous amputation.   Discussed POC with patient and bedside nurse.  Re consult if needed, will not follow at this time. Thanks  Betta Balla [employer], RN,CWOCN, CNS, [place] [phone])

## 2018-10-09 NOTE — Progress Notes (Signed)
Inpatient Diabetes Program Recommendations  AACE/ADA: New Consensus Statement on Inpatient Glycemic Control (2015)  Target Ranges:  Prepandial:   less than 140 mg/dL      Peak postprandial:   less than 180 mg/dL (1-2 hours)      Critically ill patients:  140 - 180 mg/dL   Lab Results  Component Value Date   GLUCAP 261 (H) 10/09/2018   HGBA1C 6.1 (H) 03/17/2018    Results for [name], [name] (MRN [id number]) as of 10/09/2018 10:22  Ref. Range 10/07/2018 23:44 10/08/2018 12:07 10/08/2018 16:40 10/08/2018 22:03 10/09/2018 07:32  Glucose-Capillary Latest Ref Range: 70 - 99 mg/dL 254 (H) 188 (H) 325 (H) 241 (H) 261 (H)    Review of Glycemic Control   Outpatient Diabetes medications: Levemir 20 units QHS; Novolog 7 units tid with meals   Current orders for Inpatient glycemic control: Novolog (0-9) sensitive scale tid with meals     Blood glucose remains above target. Recommend starting a portion of her home basal insulin.    Inpatient Diabetes Program Recommendations:   MD please consider ordering:  1.  Levemir 7 units Q 24 hours (based on 76 kg x 0.1 unit).  2.  Adding correction Novolog (0-5 units)  hs correction scale Q HS   Thank you.  -- Will follow during hospitalization.--  Jonna Clark RN, MSN Diabetes Coordinator Inpatient Glycemic Control Team Team Pager: [phone] (8am-5pm)

## 2018-10-09 NOTE — Plan of Care (Signed)
  Problem: Spiritual Needs Goal: Ability to function at adequate level Outcome: Progressing   Problem: Spiritual Needs Goal: Ability to function at adequate level Outcome: Progressing   Problem: Education: Goal: Knowledge of General Education information will improve Description Including pain rating scale, medication(s)/side effects and non-pharmacologic comfort measures Outcome: Progressing   Problem: Health Behavior/Discharge Planning: Goal: Ability to manage health-related needs will improve Outcome: Progressing   Problem: Clinical Measurements: Goal: Ability to maintain clinical measurements within normal limits will improve Outcome: Progressing Goal: Will remain free from infection Outcome: Progressing Goal: Diagnostic test results will improve Outcome: Progressing Goal: Respiratory complications will improve Outcome: Progressing Goal: Cardiovascular complication will be avoided Outcome: Progressing   Problem: Activity: Goal: Risk for activity intolerance will decrease Outcome: Progressing   Problem: Nutrition: Goal: Adequate nutrition will be maintained Outcome: Progressing   Problem: Coping: Goal: Level of anxiety will decrease Outcome: Progressing   Problem: Elimination: Goal: Will not experience complications related to bowel motility Outcome: Progressing Goal: Will not experience complications related to urinary retention Outcome: Progressing   Problem: Pain Managment: Goal: General experience of comfort will improve Outcome: Progressing   Problem: Safety: Goal: Ability to remain free from injury will improve Outcome: Progressing   Problem: Skin Integrity: Goal: Risk for impaired skin integrity will decrease Outcome: Progressing

## 2018-10-09 NOTE — Evaluation (Signed)
Physical Therapy Evaluation Patient Details Name: [name] MRN: [id number] DOB: 1966-01-25 Today's Date: 10/09/2018   History of Present Illness  Pt is a 53 y/o female admitted secondary to GI bleed. PMH includes L transmetatarsal amputation, HTN, DM, ESRD on HD, Bipolar disorder, and CHF.   Clinical Impression  Pt admitted secondary to problem above with deficits below. Pt requiring min A to stand and march using RW. Pt refusing further mobility stating "I just got blood. I need time for it to settle." Recommend return to [facility] with therapy services to regain baseline strength and mobility. Will continue to follow acutely to maximize functional mobility independence and safety.     Follow Up Recommendations SNF;Supervision/Assistance - 24 hour    Equipment Recommendations  None recommended by PT    Recommendations for Other Services       Precautions / Restrictions Precautions Precautions: Fall Restrictions Weight Bearing Restrictions: No LLE Weight Bearing: Partial weight bearing      Mobility  Bed Mobility               General bed mobility comments: Sitting EOB upon entry.   Transfers Overall transfer level: Needs assistance Equipment used: Rolling walker (2 wheeled) Transfers: Sit to/from Stand Sit to Stand: Min assist         General transfer comment: Min A for lift assist and steadying. Cues for safe hand placement.   Ambulation/Gait             General Gait Details: Pt refusing ambulation stating "I just got blood. I need time for it to settle"  Stairs            Wheelchair Mobility    Modified Rankin (Stroke Patients Only)       Balance Overall balance assessment: Needs assistance Sitting-balance support: No upper extremity supported;Feet supported Sitting balance-Leahy Scale: Good     Standing balance support: Bilateral upper extremity supported;During functional activity Standing balance-Leahy Scale: Poor Standing  balance comment: Reliant on BUE support                              Pertinent Vitals/Pain Pain Assessment: No/denies pain    Home Living Family/patient expects to be discharged to:: Skilled nursing facility                 Additional Comments: [facility]     Prior Function Level of Independence: Needs assistance   Gait / Transfers Assistance Needed: Reports she used RW for mobility and required assist from staff occasionally.   ADL's / Homemaking Assistance Needed: Reports she needed occasional assist from staff for ADLs        Hand Dominance        Extremity/Trunk Assessment   Upper Extremity Assessment Upper Extremity Assessment: RUE deficits/detail;Generalized weakness RUE Deficits / Details: Per RN pt with chronic R elbow fxs.     Lower Extremity Assessment Lower Extremity Assessment: LLE deficits/detail;Generalized weakness LLE Deficits / Details: L transmet amputation     Cervical / Trunk Assessment Cervical / Trunk Assessment: Normal  Communication   Communication: No difficulties  Cognition Arousal/Alertness: Awake/alert Behavior During Therapy: WFL for tasks assessed/performed Overall Cognitive Status: No family/caregiver present to determine baseline cognitive functioning  General Comments      Exercises General Exercises - Lower Extremity Hip Flexion/Marching: AROM;Both;10 reps;Standing(Required min A and use of RW )   Assessment/Plan    PT Assessment Patient needs continued PT services  PT Problem List Decreased strength;Decreased balance;Decreased mobility;Decreased knowledge of use of DME;Decreased knowledge of precautions       PT Treatment Interventions DME instruction;Gait training;Therapeutic activities;Functional mobility training;Therapeutic exercise;Balance training;Patient/family education    PT Goals (Current goals can be found in the Care Plan section)   Acute Rehab PT Goals Patient Stated Goal: to work with rehab at SNF to regain strength PT Goal Formulation: With patient Time For Goal Achievement: 10/23/18 Potential to Achieve Goals: Good    Frequency Min 2X/week   Barriers to discharge        Co-evaluation               AM-PAC PT "6 Clicks" Mobility  Outcome Measure Help needed turning from your back to your side while in a flat bed without using bedrails?: A Little Help needed moving from lying on your back to sitting on the side of a flat bed without using bedrails?: A Little Help needed moving to and from a bed to a chair (including a wheelchair)?: A Lot Help needed standing up from a chair using your arms (e.g., wheelchair or bedside chair)?: A Little Help needed to walk in hospital room?: A Lot Help needed climbing 3-5 steps with a railing? : A Lot 6 Click Score: 15    End of Session   Activity Tolerance: Patient tolerated treatment well Patient left: in bed;with call [place]/phone within reach(sitting EOB ) Nurse Communication: Mobility status PT Visit Diagnosis: Muscle weakness (generalized) (M62.81);Unsteadiness on feet (R26.81);Difficulty in walking, not elsewhere classified (R26.2)    Time: 9604-5409 PT Time Calculation (min) (ACUTE ONLY): 11 min   Charges:   PT Evaluation $PT Eval Moderate Complexity: [place], PT, DPT  Acute Rehabilitation Services  Pager: [phone] Office: [phone]   Rudean Hitt 10/09/2018, 5:25 PM

## 2018-10-09 NOTE — Progress Notes (Signed)
Stable.  Consented to blood transfusion and has had appropriate posttransfusion rise.  No further overt bleeding.  We will sign off.    However, I think the patient should probably have an updated colonoscopy as an outpatient, once her hemoglobin comes up further, since it has been 5 years since her last colonoscopy, and the prep for that exam was suboptimal in quality.  Patient understands and is agreeable.  We will take care of contacting her post discharge.  My understanding is that she resides at [facility].  Cleotis Nipper, M.D. Pager [phone] If no answer or after 5 PM call [phone]

## 2018-10-09 NOTE — Progress Notes (Signed)
PROGRESS NOTE        PATIENT DETAILS Name: [name] Age: 53 y.o. Sex: female Date of Birth: [birth year] Admit Date: 10/05/2018 Admitting Physician Shela Leff, MD QIW:[id number], [name]  Brief Narrative: Patient is a 53 y.o. female with past medical history significant for type 2 diabetes, ESRD on hemodialysis, chronic combined systolic and diastolic heart failure, hypertension, hyperlipidemia, hepatitis C, and CVA/TIA who presented to the ED with chief complaint of grossly bloody stool with bowel movements x 2 days. Associated symptoms included mild lower abdominal pain and dizziness. 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 She was admitted for rectal bleeding with acute blood loss anemia.   Subjective: Patient notes slight improvement in generalized weakness. Reports some nausea. She denies chest pain, shortness of breath, abdominal pain, hematochezia, and edema.  Assessment/Plan: Principal Problem:   GI bleed Active Problems:   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 (HCC)   Hyperkalemia   ESRD on dialysis ([place])   EKG abnormality   No blood products  1. Lower GI bleed with rectal bleeding  -No episodes of rectal bleeding overnight. Hemoglobin improved with 1 unit of blood transfusion. Will continue observation for GI bleeding  -Abdominal discomfort improved with protonix and carafate  -Renal diet ordered with fluid restriction, patient tolerating diet progression  -Followed by gastroenterology, will continue to follow  2. Acute blood loss anemia -Patient agreed to blood transfusion, received 1 unit of blood yesterday evening. Hemoglobin improved to 6.4 from 5.2. Will plan for second blood transfusion today at 1 unit.  -Continue J94, folic acid  -Continue to monitor H&H  3. ESRD on hemodialysis  -Patient received partial dialysis treatment yesterday, terminated early with 1 hr 15 min remaining.  -Hemodialysis  completed on 3/2, 3/3, and 3/4 -Followed by nephrology, defer further to nephrology   4. Type 2 diabetes  -CBG 261 this am. Continue sliding scale insulin, routine CBG monitoring   5. Hypertension -SBP 143, managed without antihypertensives at this point. No excess volume on exam. Will continue BP monitoring    Old records review: outpatient records  Morning labs/Imaging ordered: yes  DVT Prophylaxis: Prophylactic SCD's  Code Status: Full code  Family Communication: None  Disposition Plan: Remain inpatient- consider discharge tomorrow back to SNF pending clinical improvement   Antimicrobial agents: Anti-infectives (From admission, onward)   None     Procedures: Hemodialysis completed on 3/3, 3/4, 3/5   CONSULTS: -Gastroenterology -Nephrology -Palliative care    MEDICATIONS: Scheduled Meds: . sodium chloride   Intravenous Once  . acetaminophen  650 mg Oral Once  . Chlorhexidine Gluconate Cloth  6 each Topical Q0600  . cyanocobalamin  1,000 mcg Intramuscular Q7 days  . diphenhydrAMINE  25 mg Intravenous Once  . epoetin (EPOGEN/PROCRIT) injection  20,000 Units Intravenous Q T,Th,Sa-HD  . folic acid  1 mg Oral Daily  . insulin aspart  0-9 Units Subcutaneous TID WC  . pantoprazole  40 mg Oral BID AC  . sucralfate  1 g Oral TID WC & HS  . sucroferric oxyhydroxide  1,000 mg Oral TID WC   Continuous Infusions: PRN Meds:.acetaminophen, calcitRIOL, morphine injection, ondansetron (ZOFRAN) IV   PHYSICAL EXAM: Vital signs:/ Vitals:   10/08/18 2205 10/08/18 2238 10/09/18 0141 10/09/18 0624  BP: (!) 131/56 134/61 (!) 155/78 (!) 143/74  Pulse: 90 90  87  Resp: 18 20 20 18   Temp: 99.4 F (37.4 C) 99.9 F (37.7 C) 98.5 F (36.9 C) 98.5 F (36.9 C)  TempSrc: Oral Oral Oral Oral  SpO2: 97% 100% 99% 93%  Weight:    76.6 kg  Height:       Filed Weights   10/08/18 0717 10/08/18 0952 10/09/18 0624  Weight: 75 kg 74 kg 76.6 kg   Body mass index is 28.99 kg/m.     General: Awake, alert, not in any distress. Appears fatigued  HEENT: Oral mucosa moist CV: Regular rate and rhythm  Pulm: Good air entry bilaterally, no added sounds  GI: Non tender and not distended with no guarding Extremities: No LE edema bilaterally  Neurology: Speech clear Musculoskeletal: No visible joint deformities  Skin: No rash     LABORATORY DATA: CBC: Recent Labs  Lab 10/05/18 0155  10/05/18 1508 10/06/18 0552 10/07/18 0416 10/08/18 0807 10/09/18 0719  WBC 5.3   < > 4.0 4.6 7.0 7.8 6.3  NEUTROABS 3.5  --  3.3 3.2 4.5  --   --   HGB 4.6*   < > 4.2* 4.0* 4.4* 5.2* 6.4*  HCT 14.8*   < > 13.3* 12.9* 14.1* 16.9* 19.8*  MCV 99.3   < > 100.0 104.0* 103.7* 105.6* 101.0*  PLT 155   < > 128* 134* 150 148* 144*   < > = values in this interval not displayed.    Basic Metabolic Panel: Recent Labs  Lab 10/05/18 1508 10/06/18 0552 10/06/18 1522 10/07/18 0416 10/08/18 0807  NA 137 138 137 138 132*  K 4.5 6.7* 3.7 3.9 4.1  CL 99 99 97* 95* 85*  CO2 22 22 24 27 25   GLUCOSE 153* 206* 133* 164* 250*  BUN 48* 66* 19 37* 63*  CREATININE 4.44* 6.31* 2.54* 4.16* 6.15*  CALCIUM 8.2* 8.0* 8.1* 8.2* 8.1*  MG  --   --   --  2.2  --   PHOS  --   --   --   --  6.1*    GFR: Estimated Creatinine Clearance: 10.7 mL/min (A) (by C-G formula based on SCr of 6.15 mg/dL (H)).  Liver Function Tests: Recent Labs  Lab 10/05/18 0155 10/08/18 0807  AST 46*  --   ALT 50*  --   ALKPHOS 125  --   BILITOT 0.8  --   PROT 6.6  --   ALBUMIN 3.0* 2.7*   No results for input(s): LIPASE, AMYLASE in the last 168 hours. No results for input(s): AMMONIA in the last 168 hours.  Coagulation Profile: Recent Labs  Lab 10/05/18 0155  INR 1.4*    Cardiac Enzymes: Recent Labs  Lab 10/05/18 0351 10/05/18 1508  TROPONINI 0.04* 0.03*    BNP (last 3 results) No results for input(s): PROBNP in the last 8760 hours.  HbA1C: No results for input(s): HGBA1C in the last 72  hours.  CBG: Recent Labs  Lab 10/07/18 2344 10/08/18 1207 10/08/18 1640 10/08/18 2203 10/09/18 0732  GLUCAP 254* 188* 325* 241* 261*    Lipid Profile: No results for input(s): CHOL, HDL, LDLCALC, TRIG, CHOLHDL, LDLDIRECT in the last 72 hours.  Thyroid Function Tests: No results for input(s): TSH, T4TOTAL, FREET4, T3FREE, THYROIDAB in the last 72 hours.  Anemia Panel: No results for input(s): VITAMINB12, FOLATE, FERRITIN, TIBC, IRON, RETICCTPCT in the last 72 hours.  Urine analysis:    Component Value Date/Time   COLORURINE YELLOW 02/25/2017 1224   APPEARANCEUR HAZY (  A) 02/25/2017 1224   LABSPEC 1.013 02/25/2017 1224   PHURINE 8.0 02/25/2017 1224   GLUCOSEU >=500 (A) 02/25/2017 1224   HGBUR SMALL (A) 02/25/2017 1224   BILIRUBINUR NEGATIVE 02/25/2017 1224   BILIRUBINUR neg 12/13/2014 1127   KETONESUR NEGATIVE 02/25/2017 1224   PROTEINUR >=300 (A) 02/25/2017 1224   UROBILINOGEN 0.2 05/01/2015 1252   NITRITE NEGATIVE 02/25/2017 1224   LEUKOCYTESUR NEGATIVE 02/25/2017 1224    Sepsis Labs: Lactic Acid, Venous    Component Value Date/Time   LATICACIDVEN 1.80 02/25/2017 1937    MICROBIOLOGY: Recent Results (from the past 240 hour(s))  MRSA PCR Screening     Status: Abnormal   Collection Time: 10/05/18  6:31 AM  Result Value Ref Range Status   MRSA by PCR POSITIVE (A) NEGATIVE Final    Comment:        The GeneXpert MRSA Assay (FDA approved for NASAL specimens only), is one component of a comprehensive MRSA colonization surveillance program. It is not intended to diagnose MRSA infection nor to guide or monitor treatment for MRSA infections. RESULT CALLED TO, READ BACK BY AND VERIFIED WITH: Shary Key RN 9:40 10/05/18 ([email or web address]) Performed at [facility], [place] [address] [place], [place] [address]     RADIOLOGY STUDIES/RESULTS: Nm Gi Blood Loss  Result Date: 10/06/2018 CLINICAL DATA:  GI bleed EXAM: NUCLEAR MEDICINE GASTROINTESTINAL BLEEDING SCAN  TECHNIQUE: Sequential abdominal images were obtained following intravenous administration of Tc-22m labeled red blood cells. RADIOPHARMACEUTICALS:  24.1 mCi Tc-50m pertechnetate in-vitro labeled red cells. COMPARISON:  None. FINDINGS: Significant activity in the stomach due to free technetium secretion by the stomach. This is stable over the 1 hour time frame of the study. The patient refused further imaging after 1 hour. No evidence of GI bleeding.  No activity in the colon. IMPRESSION: Negative for active GI bleeding at this time. Electronically Signed   By: Franchot Gallo M.D.   On: 10/06/2018 18:39   Dg Abd Acute 2+v W 1v Chest  Result Date: 10/05/2018 CLINICAL DATA:  Missed dialysis.  GI bleed. EXAM: DG ABDOMEN ACUTE W/ 1V CHEST COMPARISON:  Chest radiographs 03/16/2018 FINDINGS: Chronic cardiomegaly with slight improvement from prior. Chronic vascular congestion. Improved pulmonary edema. No significant pleural fluid. No bowel dilatation to suggest obstruction. Air-filled transverse colon with air-fluid levels, formed stool in the more proximal and distal colon. No evidence of free air. Advanced vascular calcifications, no radiopaque calculi. No evidence of acute osseous abnormality. IMPRESSION: 1. No free air or bowel obstruction. Mild gaseous distension of transverse colon with air-fluid levels, formed stool proximal and distally. 2. Chronic cardiomegaly and vascular congestion. Electronically Signed   By: Keith Rake M.D.   On: 10/05/2018 03:02     LOS: 4 days   Deboraha Sprang, PA-S  10/09/2018, 8:54 AM

## 2018-10-10 LAB — CBC
HEMATOCRIT: 25.3 % — AB (ref 36.0–46.0)
Hemoglobin: 8.4 g/dL — ABNORMAL LOW (ref 12.0–15.0)
MCH: 31.9 pg (ref 26.0–34.0)
MCHC: 33.2 g/dL (ref 30.0–36.0)
MCV: 96.2 fL (ref 80.0–100.0)
Platelets: 145 10*3/uL — ABNORMAL LOW (ref 150–400)
RBC: 2.63 MIL/uL — ABNORMAL LOW (ref 3.87–5.11)
RDW: 20.9 % — ABNORMAL HIGH (ref 11.5–15.5)
WBC: 6.6 10*3/uL (ref 4.0–10.5)
nRBC: 3.2 % — ABNORMAL HIGH (ref 0.0–0.2)

## 2018-10-10 LAB — RENAL FUNCTION PANEL
Albumin: 2.8 g/dL — ABNORMAL LOW (ref 3.5–5.0)
Anion gap: 16 — ABNORMAL HIGH (ref 5–15)
BUN: 56 mg/dL — ABNORMAL HIGH (ref 6–20)
CHLORIDE: 87 mmol/L — AB (ref 98–111)
CO2: 23 mmol/L (ref 22–32)
Calcium: 8.5 mg/dL — ABNORMAL LOW (ref 8.9–10.3)
Creatinine, Ser: 6.81 mg/dL — ABNORMAL HIGH (ref 0.44–1.00)
GFR calc non Af Amer: 6 mL/min — ABNORMAL LOW (ref >60)
GFR, EST AFRICAN AMERICAN: 7 mL/min — AB (ref >60)
Glucose, Bld: 378 mg/dL — ABNORMAL HIGH (ref 70–99)
Phosphorus: 5 mg/dL — ABNORMAL HIGH (ref 2.5–4.6)
Potassium: 3.5 mmol/L (ref 3.5–5.1)
Sodium: 126 mmol/L — ABNORMAL LOW (ref 135–145)

## 2018-10-10 LAB — BPAM RBC
Blood Product Expiration Date: 202004052359
Blood Product Expiration Date: 202004052359
ISSUE DATE / TIME: 202003052217
ISSUE DATE / TIME: 202003061008
Unit Type and Rh: 5100
Unit Type and Rh: 5100

## 2018-10-10 LAB — TYPE AND SCREEN
ABO/RH(D): O POS
Antibody Screen: NEGATIVE
Unit division: 0
Unit division: 0

## 2018-10-10 LAB — GLUCOSE, CAPILLARY: Glucose-Capillary: 294 mg/dL — ABNORMAL HIGH (ref 70–99)

## 2018-10-10 MED ORDER — FOLIC ACID 1 MG PO TABS: 1.0000 mg | ORAL_TABLET | Freq: Every day | ORAL | Status: DC

## 2018-10-10 MED ORDER — OXYCODONE HCL 10 MG PO TABS: 10.0000 mg | ORAL_TABLET | Freq: Four times a day (QID) | ORAL | Status: DC | PRN

## 2018-10-10 MED ORDER — LIDOCAINE-PRILOCAINE 2.5-2.5 % EX CREA: 1.0000 "application " | TOPICAL_CREAM | CUTANEOUS | Status: DC | PRN

## 2018-10-10 MED ORDER — INSULIN DETEMIR 100 UNIT/ML FLEXPEN: 12.0000 [IU] | PEN_INJECTOR | Freq: Every day | SUBCUTANEOUS | 11 refills | Status: DC

## 2018-10-10 MED ORDER — HYDRALAZINE HCL 20 MG/ML IJ SOLN
10.0000 mg | Freq: Four times a day (QID) | INTRAMUSCULAR | Status: DC | PRN
  Administered 2018-10-10: 10 mg via INTRAVENOUS
  Filled 2018-10-10: qty 1

## 2018-10-10 MED ORDER — SODIUM CHLORIDE 0.9 % IV SOLN: 100.0000 mL | INTRAVENOUS | Status: DC | PRN

## 2018-10-10 MED ORDER — LIDOCAINE HCL (PF) 1 % IJ SOLN: 5.0000 mL | INTRAMUSCULAR | Status: DC | PRN

## 2018-10-10 MED ORDER — CALCITRIOL 0.25 MCG PO CAPS: 1.7500 ug | ORAL_CAPSULE | ORAL | Status: DC | PRN

## 2018-10-10 MED ORDER — CALCITRIOL 0.25 MCG PO CAPS
ORAL_CAPSULE | ORAL | Status: AC
  Filled 2018-10-10: qty 1

## 2018-10-10 MED ORDER — EPOETIN ALFA 20000 UNIT/ML IJ SOLN: 20000.0000 [IU] | INTRAMUSCULAR | Status: DC

## 2018-10-10 MED ORDER — INSULIN ASPART 100 UNIT/ML ~~LOC~~ SOLN: SUBCUTANEOUS | 11 refills | Status: DC

## 2018-10-10 MED ORDER — PENTAFLUOROPROP-TETRAFLUOROETH EX AERO: 1.0000 "application " | INHALATION_SPRAY | CUTANEOUS | Status: DC | PRN

## 2018-10-10 MED ORDER — LORAZEPAM 0.5 MG PO TABS: 0.5000 mg | ORAL_TABLET | Freq: Three times a day (TID) | ORAL | 0 refills | Status: DC | PRN

## 2018-10-10 MED ORDER — HEPARIN SODIUM (PORCINE) 1000 UNIT/ML DIALYSIS: 1000.0000 [IU] | INTRAMUSCULAR | Status: DC | PRN

## 2018-10-10 MED ORDER — OXYCODONE HCL 10 MG PO TABS: 10.0000 mg | ORAL_TABLET | Freq: Four times a day (QID) | ORAL | 0 refills | Status: DC | PRN

## 2018-10-10 MED ORDER — LORAZEPAM 0.5 MG PO TABS
0.5000 mg | ORAL_TABLET | Freq: Four times a day (QID) | ORAL | Status: DC | PRN
  Administered 2018-10-10: 0.5 mg via ORAL
  Filled 2018-10-10: qty 1

## 2018-10-10 MED ORDER — CALCITRIOL 0.5 MCG PO CAPS
ORAL_CAPSULE | ORAL | Status: AC
  Filled 2018-10-10: qty 3

## 2018-10-10 MED ORDER — ALTEPLASE 2 MG IJ SOLR: 2.0000 mg | Freq: Once | INTRAMUSCULAR | Status: DC | PRN

## 2018-10-10 NOTE — Progress Notes (Signed)
Received report from night shift nurse. Patient currently off unit to HDU.

## 2018-10-10 NOTE — Discharge Summary (Signed)
PATIENT DETAILS Name: [name] Age: 53 y.o. Sex: female Date of Birth: [birth year] MRN: [id number]. Admitting Physician: Shela Leff, MD [id number], [name]  Admit Date: 10/05/2018 Discharge date: 10/10/2018  Recommendations for Outpatient Follow-up:  1. Follow up with PCP in 1-2 weeks 2. Please obtain BMP/CBC in one week  3. Ensure follow-up with [facility] gastroenterology for elective outpatient colonoscopy 4. Ensure follow-up with Dr. Sharol Given 5. Ensure follow-up with hemodialysis center on usual schedule-Tuesdays Thursdays and Saturdays   Admitted From:  SNF  Disposition: SNF   Home Health: No  Equipment/Devices:  None  Discharge Condition: Stable  CODE STATUS: FULL CODE  Diet recommendation:  Heart Healthy / Carb Modified  Brief Summary: See H&P, Labs, Consult and Test reports for all details in brief, Patient is a53 y.o.femaleJehovah's Witness-history of ESRD on HD, chronic combined systolic and diastolic heart failure, DM-2, left transmetatarsal foot amputation-presenting with lower GI bleed and acute blood loss anemia.  She was initially adamant about refusing PRBC transfusion, but on 3/5-she agreed to be transfused.  With supportive care-lower GI bleeding has completely resolved.  See below for further details.   Brief Hospital Course: Lower GI bleeding with acute blood loss anemia:  Resolved-initially refused PRBC transfusion as she is a Jehovah witness, but due to severe anemia-and her being symptomatic-she reluctantly consented to transfusion, and was subsequently transfused 2 units of PRBC.  GI bleeding has completely resolved, no further recommendations from gastroenterology except for outpatient follow-up with [facility] gastroenterology for consideration of a outpatient elective colonoscopy.  Tagged RBC scan on 3/3 was negative.    ESRD on HD TTS: Continue outpatient HD  DM-2: CBGs but on the higher side-resuming low-dose Lantus at 12 units, and  SSI on discharge.  Hypertension: BP slowly creeping up-resuming amlodipine discharge-further optimization deferred to attending MD at SNF.  Left transmetatarsal foot amputation: Follow with Dr. Sharol Given  Procedures/Studies: None  Discharge Diagnoses:  Principal Problem:   GI bleed Active Problems:   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 (HCC)   Hyperkalemia   ESRD on dialysis ([place])   EKG abnormality   No blood products   Discharge Instructions:  Activity:  As tolerated  Discharge Instructions    Diet - low sodium heart healthy   Complete by:  As directed    Diet Carb Modified   Complete by:  As directed    Discharge instructions   Complete by:  As directed    Check CBGs before meals and at bedtime  Follow with attending MD at SNF  Follow with [facility] gastroenterology in the next few weeks for outpatient elective colonoscopy  Follow with hemodialysis center to usual schedule  Please get a complete blood count and chemistry panel checked by your Primary MD at your next visit, and again as instructed by your Primary MD.  Get Medicines reviewed and adjusted: Please take all your medications with you for your next visit with your Primary MD  Laboratory/radiological data: Please request your Primary MD to go over all hospital tests and procedure/radiological results at the follow up, please ask your Primary MD to get all Hospital records sent to his/her office.  In some cases, they will be blood work, cultures and biopsy results pending at the time of your discharge. Please request that your primary care M.D. follows up on these results.  Also Note the following: If you experience worsening of your admission symptoms, develop shortness of breath, life threatening emergency, suicidal or homicidal thoughts you  must seek medical attention immediately by calling 911 or calling your MD immediately  if symptoms less severe.  You must read complete instructions/literature  along with all the possible adverse reactions/side effects for all the Medicines you take and that have been prescribed to you. Take any new Medicines after you have completely understood and accpet all the possible adverse reactions/side effects.   Do not drive when taking Pain medications or sleeping medications (Benzodaizepines)  Do not take more than prescribed Pain, Sleep and Anxiety Medications. It is not advisable to combine anxiety,sleep and pain medications without talking with your primary care practitioner  Special Instructions: If you have smoked or chewed Tobacco  in the last 2 yrs please stop smoking, stop any regular Alcohol  and or any Recreational drug use.  Wear Seat belts while driving.  Please note: You were cared for by a hospitalist during your hospital stay. Once you are discharged, your primary care physician will handle any further medical issues. Please note that NO REFILLS for any discharge medications will be authorized once you are discharged, as it is imperative that you return to your primary care physician (or establish a relationship with a primary care physician if you do not have one) for your post hospital discharge needs so that they can reassess your need for medications and monitor your lab values.   Increase activity slowly   Complete by:  As directed      Allergies as of 10/10/2018      Reactions   Penicillins Hives, Other (See Comments)   Has patient had a PCN reaction causing immediate rash, facial/tongue/throat swelling, SOB or lightheadedness with hypotension: Yes Has patient had a PCN reaction causing severe rash involving mucus membranes or skin necrosis: No Has patient had a PCN reaction that required hospitalization No Has patient had a PCN reaction occurring within the last 10 years: No If all of the above answers are "NO", then may proceed with Cephalosporin use. Pt tolerates cefepime and ceftriaxone   Gabapentin Itching, Swelling   Lyrica  [pregabalin] Swelling, Other (See Comments)   Facial and leg swelling   Saphris [asenapine] Swelling, Other (See Comments)   Facial swelling   Tramadol Swelling, Other (See Comments)   Leg swelling   Adhesive [tape] Itching   SEVERE ITCHING   Latex Itching   SEVERE ITCHING   Other Other (See Comments)   NO BLOOD PRODUCTS; PATIENT IS A JEHOVAH'S WITNESS      Medication List    STOP taking these medications   carvedilol 25 MG tablet Commonly known as:  COREG   cloNIDine 0.1 MG tablet Commonly known as:  CATAPRES   feeding supplement (NEPRO CARB STEADY) Liqd   insulin glargine 100 UNIT/ML injection Commonly known as:  LANTUS   sertraline 50 MG tablet Commonly known as:  ZOLOFT     TAKE these medications   albuterol (2.5 MG/3ML) 0.083% nebulizer solution Commonly known as:  PROVENTIL Take 2.5 mg by nebulization every 6 (six) hours as needed for wheezing or shortness of breath.   albuterol 108 (90 Base) MCG/ACT inhaler Commonly known as:  PROVENTIL HFA;VENTOLIN HFA Inhale 2 puffs into the lungs every 4 (four) hours as needed for wheezing or shortness of breath.   allopurinol 100 MG tablet Commonly known as:  ZYLOPRIM Take 1 tablet (100 mg total) by mouth every Tuesday, Thursday, and Saturday at 6 PM.   amLODipine 10 MG tablet Commonly known as:  NORVASC Take 1 tablet (10 mg total)  by mouth at bedtime.   atorvastatin 40 MG tablet Commonly known as:  LIPITOR Take 1 tablet (40 mg total) by mouth at bedtime.   benzocaine 10 % mucosal gel Commonly known as:  ORAJEL Use as directed 1 application in the mouth or throat 2 (two) times daily as needed for mouth pain.   bisacodyl 10 MG suppository Commonly known as:  DULCOLAX Place 10 mg rectally daily as needed (for constipation).   calcitRIOL 0.25 MCG capsule Commonly known as:  ROCALTROL Take 7 capsules (1.75 mcg total) by mouth every dialysis (3 times per week on TTS).   calcium acetate 667 MG capsule Commonly  known as:  PHOSLO Take 2 capsules (1,334 mg total) by mouth 3 (three) times daily with meals.   camphor-menthol lotion Commonly known as:  SARNA Apply 1 application topically as needed for itching.   diphenhydrAMINE 25 mg capsule Commonly known as:  BENADRYL Take 25 mg by mouth every 6 (six) hours as needed for itching.   docusate sodium 100 MG capsule Commonly known as:  COLACE Take 100 mg by mouth 2 (two) times daily.   DULoxetine 30 MG capsule Commonly known as:  CYMBALTA Take 90 mg by mouth daily. Take with 60mg  to equal 90mg  once daily   DULoxetine 60 MG capsule Commonly known as:  CYMBALTA Take 90 mg by mouth daily. Take with 30mg  to equal 90mg  once daily   epoetin alfa 20000 UNIT/ML injection Commonly known as:  EPOGEN,PROCRIT Inject 1 mL (20,000 Units total) into the vein Every Tuesday,Thursday,and Saturday with dialysis.   Ferrous Sulfate 142 (45 Fe) MG Tbcr Commonly known as:  Slow Fe Take 1 tablet by mouth daily.   folic acid 1 MG tablet Commonly known as:  FOLVITE Take 1 tablet (1 mg total) by mouth daily.   hydrocortisone 2.5 % rectal cream Commonly known as:  ANUSOL-HC Place rectally 2 (two) times daily. What changed:    how much to take  when to take this  reasons to take this   insulin aspart 100 UNIT/ML injection Commonly known as:  novoLOG 0-9 Units, Subcutaneous, 3 times daily with meals CBG < 70: implement hypoglycemia protocol CBG 70 - 120: 0 units CBG 121 - 150: 1 unit CBG 151 - 200: 2 units CBG 201 - 250: 3 units CBG 251 - 300: 5 units CBG 301 - 350: 7 units CBG 351 - 400: 9 units CBG > 400: call MD What changed:    how much to take  how to take this  when to take this  additional instructions   Insulin Detemir 100 UNIT/ML Pen Commonly known as:  Levemir FlexTouch Inject 12 Units into the skin at bedtime. What changed:  how much to take   LORazepam 0.5 MG tablet Commonly known as:  Ativan Take 1 tablet (0.5 mg total) by  mouth every 8 (eight) hours as needed for anxiety.   methocarbamol 500 MG tablet Commonly known as:  ROBAXIN Take 500 mg by mouth 3 (three) times daily as needed for muscle spasms.   multivitamin Tabs tablet Take 1 tablet by mouth at bedtime.   mupirocin ointment 2 % Commonly known as:  Bactroban Apply 1 application topically 2 (two) times daily.   ondansetron 4 MG tablet Commonly known as:  ZOFRAN Take 4 mg by mouth every 8 (eight) hours as needed for nausea or vomiting.   Oxycodone HCl 10 MG Tabs Take 1 tablet (10 mg total) by mouth every 6 (six) hours as  needed (for pain).   pantoprazole 40 MG tablet Commonly known as:  PROTONIX Take 1 tablet (40 mg total) by mouth daily at 6 (six) AM.   saccharomyces boulardii 250 MG capsule Commonly known as:  FLORASTOR Take 250 mg by mouth daily.   senna 8.6 MG tablet Commonly known as:  SENOKOT Take 2 tablets by mouth daily.   traZODone 50 MG tablet Commonly known as:  DESYREL Take 0.5 tablets (25 mg total) by mouth at bedtime. What changed:  how much to take       Contact information for follow-up providers    Gastroenterology, [facility]. Schedule an appointment as soon as possible for a visit in 2 week(s).   Contact information: [place] [address] [phone]        Hemodialysis center Follow up.   Why:  Follow at your usual schedule on Tuesdays, Thursdays and Saturdays           Contact information for after-discharge care    Destination    [facility] .   Service:  Skilled Nursing Contact information: [place] [phone]                 Allergies  Allergen Reactions  . Penicillins Hives and Other (See Comments)    Has patient had a PCN reaction causing immediate rash, facial/tongue/throat swelling, SOB or lightheadedness with hypotension: Yes Has patient had a PCN reaction causing severe rash  involving mucus membranes or skin necrosis: No Has patient had a PCN reaction that required hospitalization No Has patient had a PCN reaction occurring within the last 10 years: No If all of the above answers are "NO", then may proceed with Cephalosporin use. Pt tolerates cefepime and ceftriaxone  . Gabapentin Itching and Swelling  . Lyrica [Pregabalin] Swelling and Other (See Comments)    Facial and leg swelling  . Saphris [Asenapine] Swelling and Other (See Comments)    Facial swelling  . Tramadol Swelling and Other (See Comments)    Leg swelling  . Adhesive [Tape] Itching    SEVERE ITCHING  . Latex Itching    SEVERE ITCHING  . Other Other (See Comments)    NO BLOOD PRODUCTS; PATIENT IS A JEHOVAH'S WITNESS    Consultations:   GI, nephrology and Palliative care   Other Procedures/Studies: Nm Gi Blood Loss  Result Date: 10/06/2018 CLINICAL DATA:  GI bleed EXAM: NUCLEAR MEDICINE GASTROINTESTINAL BLEEDING SCAN TECHNIQUE: Sequential abdominal images were obtained following intravenous administration of Tc-78m labeled red blood cells. RADIOPHARMACEUTICALS:  24.1 mCi Tc-5m pertechnetate in-vitro labeled red cells. COMPARISON:  None. FINDINGS: Significant activity in the stomach due to free technetium secretion by the stomach. This is stable over the 1 hour time frame of the study. The patient refused further imaging after 1 hour. No evidence of GI bleeding.  No activity in the colon. IMPRESSION: Negative for active GI bleeding at this time. Electronically Signed   By: Franchot Gallo M.D.   On: 10/06/2018 18:39   Dg Abd Acute 2+v W 1v Chest  Result Date: 10/05/2018 CLINICAL DATA:  Missed dialysis.  GI bleed. EXAM: DG ABDOMEN ACUTE W/ 1V CHEST COMPARISON:  Chest radiographs 03/16/2018 FINDINGS: Chronic cardiomegaly with slight improvement from prior. Chronic vascular congestion. Improved pulmonary edema. No significant pleural fluid. No bowel dilatation to suggest obstruction. Air-filled  transverse colon with air-fluid levels, formed stool in the more proximal and distal colon. No evidence of  free air. Advanced vascular calcifications, no radiopaque calculi. No evidence of acute osseous abnormality. IMPRESSION: 1. No free air or bowel obstruction. Mild gaseous distension of transverse colon with air-fluid levels, formed stool proximal and distally. 2. Chronic cardiomegaly and vascular congestion. Electronically Signed   By: Keith Rake M.D.   On: 10/05/2018 03:02      TODAY-DAY OF DISCHARGE:  Subjective:   [name] today has no headache,no chest abdominal pain,no new weakness tingling or numbness, feels much better wants to go home today.   Objective:   Blood pressure (!) 176/79, pulse (!) 104, temperature 98.2 F (36.8 C), temperature source Oral, resp. rate 18, height 5\' 4"  (1.626 m), weight 76.4 kg, SpO2 98 %.  Intake/Output Summary (Last 24 hours) at 10/10/2018 0936 Last data filed at 10/09/2018 1330 Gross per 24 hour  Intake 703 ml  Output -  Net 703 ml   Filed Weights   10/09/18 0624 10/10/18 0505 10/10/18 0705  Weight: 76.6 kg 77.4 kg 76.4 kg    Exam: Awake Alert, Oriented *3, No new F.N deficits, Normal affect [place].AT,PERRAL Supple Neck,No JVD, No cervical lymphadenopathy appriciated.  Symmetrical Chest wall movement, Good air movement bilaterally, CTAB RRR,No Gallops,Rubs or new Murmurs, No Parasternal Heave +ve B.Sounds, Abd Soft, Non tender, No organomegaly appriciated, No rebound -guarding or rigidity. No Cyanosis, Clubbing or edema, No new Rash or bruise   PERTINENT RADIOLOGIC STUDIES: Nm Gi Blood Loss  Result Date: 10/06/2018 CLINICAL DATA:  GI bleed EXAM: NUCLEAR MEDICINE GASTROINTESTINAL BLEEDING SCAN TECHNIQUE: Sequential abdominal images were obtained following intravenous administration of Tc-54m labeled red blood cells. RADIOPHARMACEUTICALS:  24.1 mCi Tc-89m pertechnetate in-vitro labeled red cells. COMPARISON:  None. FINDINGS: Significant  activity in the stomach due to free technetium secretion by the stomach. This is stable over the 1 hour time frame of the study. The patient refused further imaging after 1 hour. No evidence of GI bleeding.  No activity in the colon. IMPRESSION: Negative for active GI bleeding at this time. Electronically Signed   By: Franchot Gallo M.D.   On: 10/06/2018 18:39   Dg Abd Acute 2+v W 1v Chest  Result Date: 10/05/2018 CLINICAL DATA:  Missed dialysis.  GI bleed. EXAM: DG ABDOMEN ACUTE W/ 1V CHEST COMPARISON:  Chest radiographs 03/16/2018 FINDINGS: Chronic cardiomegaly with slight improvement from prior. Chronic vascular congestion. Improved pulmonary edema. No significant pleural fluid. No bowel dilatation to suggest obstruction. Air-filled transverse colon with air-fluid levels, formed stool in the more proximal and distal colon. No evidence of free air. Advanced vascular calcifications, no radiopaque calculi. No evidence of acute osseous abnormality. IMPRESSION: 1. No free air or bowel obstruction. Mild gaseous distension of transverse colon with air-fluid levels, formed stool proximal and distally. 2. Chronic cardiomegaly and vascular congestion. Electronically Signed   By: Keith Rake M.D.   On: 10/05/2018 03:02     PERTINENT LAB RESULTS: CBC: Recent Labs    10/09/18 0719 10/10/18 0314  WBC 6.3 6.6  HGB 6.4* 8.4*  HCT 19.8* 25.3*  PLT 144* 145*   CMET CMP     Component Value Date/Time   NA 126 (L) 10/10/2018 0745   NA 130 (A) 12/13/2015   K 3.5 10/10/2018 0745   CL 87 (L) 10/10/2018 0745   CO2 23 10/10/2018 0745   GLUCOSE 378 (H) 10/10/2018 0745   BUN 56 (H) 10/10/2018 0745   BUN 68 (A) 12/13/2015   CREATININE 6.81 (H) 10/10/2018 0745   CALCIUM 8.5 (L) 10/10/2018 0745  PROT 6.6 10/05/2018 0155   ALBUMIN 2.8 (L) 10/10/2018 0745   AST 46 (H) 10/05/2018 0155   ALT 50 (H) 10/05/2018 0155   ALKPHOS 125 10/05/2018 0155   BILITOT 0.8 10/05/2018 0155   GFRNONAA 6 (L) 10/10/2018  0745   GFRAA 7 (L) 10/10/2018 0745    GFR Estimated Creatinine Clearance: 9.7 mL/min (A) (by C-G formula based on SCr of 6.81 mg/dL (H)). No results for input(s): LIPASE, AMYLASE in the last 72 hours. No results for input(s): CKTOTAL, CKMB, CKMBINDEX, TROPONINI in the last 72 hours. Invalid input(s): POCBNP No results for input(s): DDIMER in the last 72 hours. No results for input(s): HGBA1C in the last 72 hours. No results for input(s): CHOL, HDL, LDLCALC, TRIG, CHOLHDL, LDLDIRECT in the last 72 hours. No results for input(s): TSH, T4TOTAL, T3FREE, THYROIDAB in the last 72 hours.  Invalid input(s): FREET3 No results for input(s): VITAMINB12, FOLATE, FERRITIN, TIBC, IRON, RETICCTPCT in the last 72 hours. Coags: No results for input(s): INR in the last 72 hours.  Invalid input(s): PT Microbiology: Recent Results (from the past 240 hour(s))  MRSA PCR Screening     Status: Abnormal   Collection Time: 10/05/18  6:31 AM  Result Value Ref Range Status   MRSA by PCR POSITIVE (A) NEGATIVE Final    Comment:        The GeneXpert MRSA Assay (FDA approved for NASAL specimens only), is one component of a comprehensive MRSA colonization surveillance program. It is not intended to diagnose MRSA infection nor to guide or monitor treatment for MRSA infections. RESULT CALLED TO, READ BACK BY AND VERIFIED WITH: Shary Key RN 9:40 10/05/18 ([email or web address]) Performed at [facility], [place] [address], [place], [place] [address]     FURTHER DISCHARGE INSTRUCTIONS:  Get Medicines reviewed and adjusted: Please take all your medications with you for your next visit with your Primary MD  Laboratory/radiological data: Please request your Primary MD to go over all hospital tests and procedure/radiological results at the follow up, please ask your Primary MD to get all Hospital records sent to his/her office.  In some cases, they will be blood work, cultures and biopsy results pending at the time of  your discharge. Please request that your primary care M.D. goes through all the records of your hospital data and follows up on these results.  Also Note the following: If you experience worsening of your admission symptoms, develop shortness of breath, life threatening emergency, suicidal or homicidal thoughts you must seek medical attention immediately by calling 911 or calling your MD immediately  if symptoms less severe.  You must read complete instructions/literature along with all the possible adverse reactions/side effects for all the Medicines you take and that have been prescribed to you. Take any new Medicines after you have completely understood and accpet all the possible adverse reactions/side effects.   Do not drive when taking Pain medications or sleeping medications (Benzodaizepines)  Do not take more than prescribed Pain, Sleep and Anxiety Medications. It is not advisable to combine anxiety,sleep and pain medications without talking with your primary care practitioner  Special Instructions: If you have smoked or chewed Tobacco  in the last 2 yrs please stop smoking, stop any regular Alcohol  and or any Recreational drug use.  Wear Seat belts while driving.  Please note: You were cared for by a hospitalist during your hospital stay. Once you are discharged, your primary care physician will handle any further medical issues. Please note that  NO REFILLS for any discharge medications will be authorized once you are discharged, as it is imperative that you return to your primary care physician (or establish a relationship with a primary care physician if you do not have one) for your post hospital discharge needs so that they can reassess your need for medications and monitor your lab values.  Total Time spent coordinating discharge including counseling, education and face to face time equals 35 minutes.  SignedOren Binet 10/10/2018 9:36 AM

## 2018-10-10 NOTE — Progress Notes (Signed)
Clinical Social Worker facilitated patient discharge including contacting patient family and facility to confirm patient discharge plans.  Clinical information faxed to facility and family agreeable with plan.  CSW arranged ambulance transport via [facility] to [facility].  RN to call for report prior to discharge [phone])  Clinical Social Worker will sign off for now as social work intervention is no longer needed. Please consult [place] again if new need arises.  Mahamad Zlata Alcaide LCSW [phone]

## 2018-10-10 NOTE — Progress Notes (Signed)
Patient discharged to [facility].

## 2018-10-10 NOTE — Progress Notes (Signed)
[facility] Progress Note   Subjective:  Patient seen and examined at bedside.  Feeling better today.  Lethargy and strength improved s/p 1unit pRBC transfusion.  Reports headache. Denies fever, chills, SOB, CP, n/v/d and dizziness.   Objective Vitals:   10/10/18 0930 10/10/18 1000 10/10/18 1030 10/10/18 1043  BP: (!) 176/79 (!) 142/62 (!) 178/85 (!) 151/99  Pulse: (!) 104 91 (!) 106 (!) 103  Resp:    18  Temp:    98.1 F (36.7 C)  TempSrc:    Oral  SpO2:    98%  Weight:    74.6 kg  Height:       Physical Exam General:NAD, well appearing female, laying in bed Heart:RRR, no mrg Lungs:CTAB, no wheeze, rales or rhonchi; nml WOB Abdomen:soft, NTND Extremities:L TMA, no LE edema Dialysis Access: LU AVG +b   Filed Weights   10/10/18 0505 10/10/18 0705 10/10/18 1043  Weight: 77.4 kg 76.4 kg 74.6 kg    Intake/Output Summary (Last 24 hours) at 10/10/2018 1218 Last data filed at 10/10/2018 1130 Gross per 24 hour  Intake 628 ml  Output 2000 ml  Net -1372 ml    Additional Objective Labs: Basic Metabolic Panel: Recent Labs  Lab 10/07/18 0416 10/08/18 0807 10/10/18 0745  NA 138 132* 126*  K 3.9 4.1 3.5  CL 95* 85* 87*  CO2 27 25 23   GLUCOSE 164* 250* 378*  BUN 37* 63* 56*  CREATININE 4.16* 6.15* 6.81*  CALCIUM 8.2* 8.1* 8.5*  PHOS  --  6.1* 5.0*   Liver Function Tests: Recent Labs  Lab 10/05/18 0155 10/08/18 0807 10/10/18 0745  AST 46*  --   --   ALT 50*  --   --   ALKPHOS 125  --   --   BILITOT 0.8  --   --   PROT 6.6  --   --   ALBUMIN 3.0* 2.7* 2.8*   CBC: Recent Labs  Lab 10/05/18 1508 10/06/18 0552 10/07/18 0416 10/08/18 0807 10/09/18 0719 10/10/18 0314  WBC 4.0 4.6 7.0 7.8 6.3 6.6  NEUTROABS 3.3 3.2 4.5  --   --   --   HGB 4.2* 4.0* 4.4* 5.2* 6.4* 8.4*  HCT 13.3* 12.9* 14.1* 16.9* 19.8* 25.3*  MCV 100.0 104.0* 103.7* 105.6* 101.0* 96.2  PLT 128* 134* 150 148* 144* 145*   Cardiac Enzymes: Recent Labs  Lab 10/05/18 0351  10/05/18 1508  TROPONINI 0.04* 0.03*   CBG: Recent Labs  Lab 10/08/18 2203 10/09/18 0732 10/09/18 1136 10/09/18 1631 10/09/18 2204  GLUCAP 241* 261* 203* 318* 266*   Medications:  . Chlorhexidine Gluconate Cloth  6 each Topical Q0600  . cyanocobalamin  1,000 mcg Intramuscular Q7 days  . epoetin (EPOGEN/PROCRIT) injection  20,000 Units Intravenous Q T,Th,Sa-HD  . folic acid  1 mg Oral Daily  . insulin aspart  0-9 Units Subcutaneous TID WC  . pantoprazole  40 mg Oral BID AC  . sucralfate  1 g Oral TID WC & HS  . sucroferric oxyhydroxide  1,000 mg Oral TID WC    Dialysis Orders: 3.5h 2/2 bath 75kg LUE AVG Hep 7000  - mircera 50 every 2 wks - calcitriol 1.75ug tiw - venofer 50 /wk - last Hb 10.0 on 2/27  Assessment/Plan: 1. LGIB w/rectal bleeding in setting of AOCKD - Hgb^ 6.4  S/p 2 unit pRBC last night, to receive another unit today. Aranesp 200 mcg & Feraheme 510mg  given on 3/1, EPO 20000 units qHD and B12  and folic acid. Follow trends. Nuclear scan 3/3 showed no active bleeding. GI signed off with plans to have colonoscopy completed as OP. Will dc high dose EPO for now.  2. ESRD - on HD. TTS.  HD today.  K 4.1. Holding heparin d/t GIB.   4. Secondary hyperparathyroidism - Ca at goal. Phos elevated. Continue binder and VDRA.   5. HTN/volume - BP variable, elevated today, monitor.  Does not appear volume overloaded on exam.  6. Nutrition - Alb 2.7. Renal diet w/fluid restrictions. Protein supplements.  7. DMT2 - per primary.   Jen Mow, PA-C [facility] Pager: [phone] 10/10/2018,12:18 PM  LOS: 5 days   Pt seen, examined and agree w A/P as above.  [facility] 10/10/2018, 12:18 PM

## 2018-10-25 IMAGING — DX DG CHEST 1V PORT
1 series · 1 of 1 positions shown · non-contrast
Comparison: 06/03/2017

CLINICAL DATA: Weakness and GI bleeding

EXAM:
PORTABLE CHEST 1 VIEW

[chest ap]
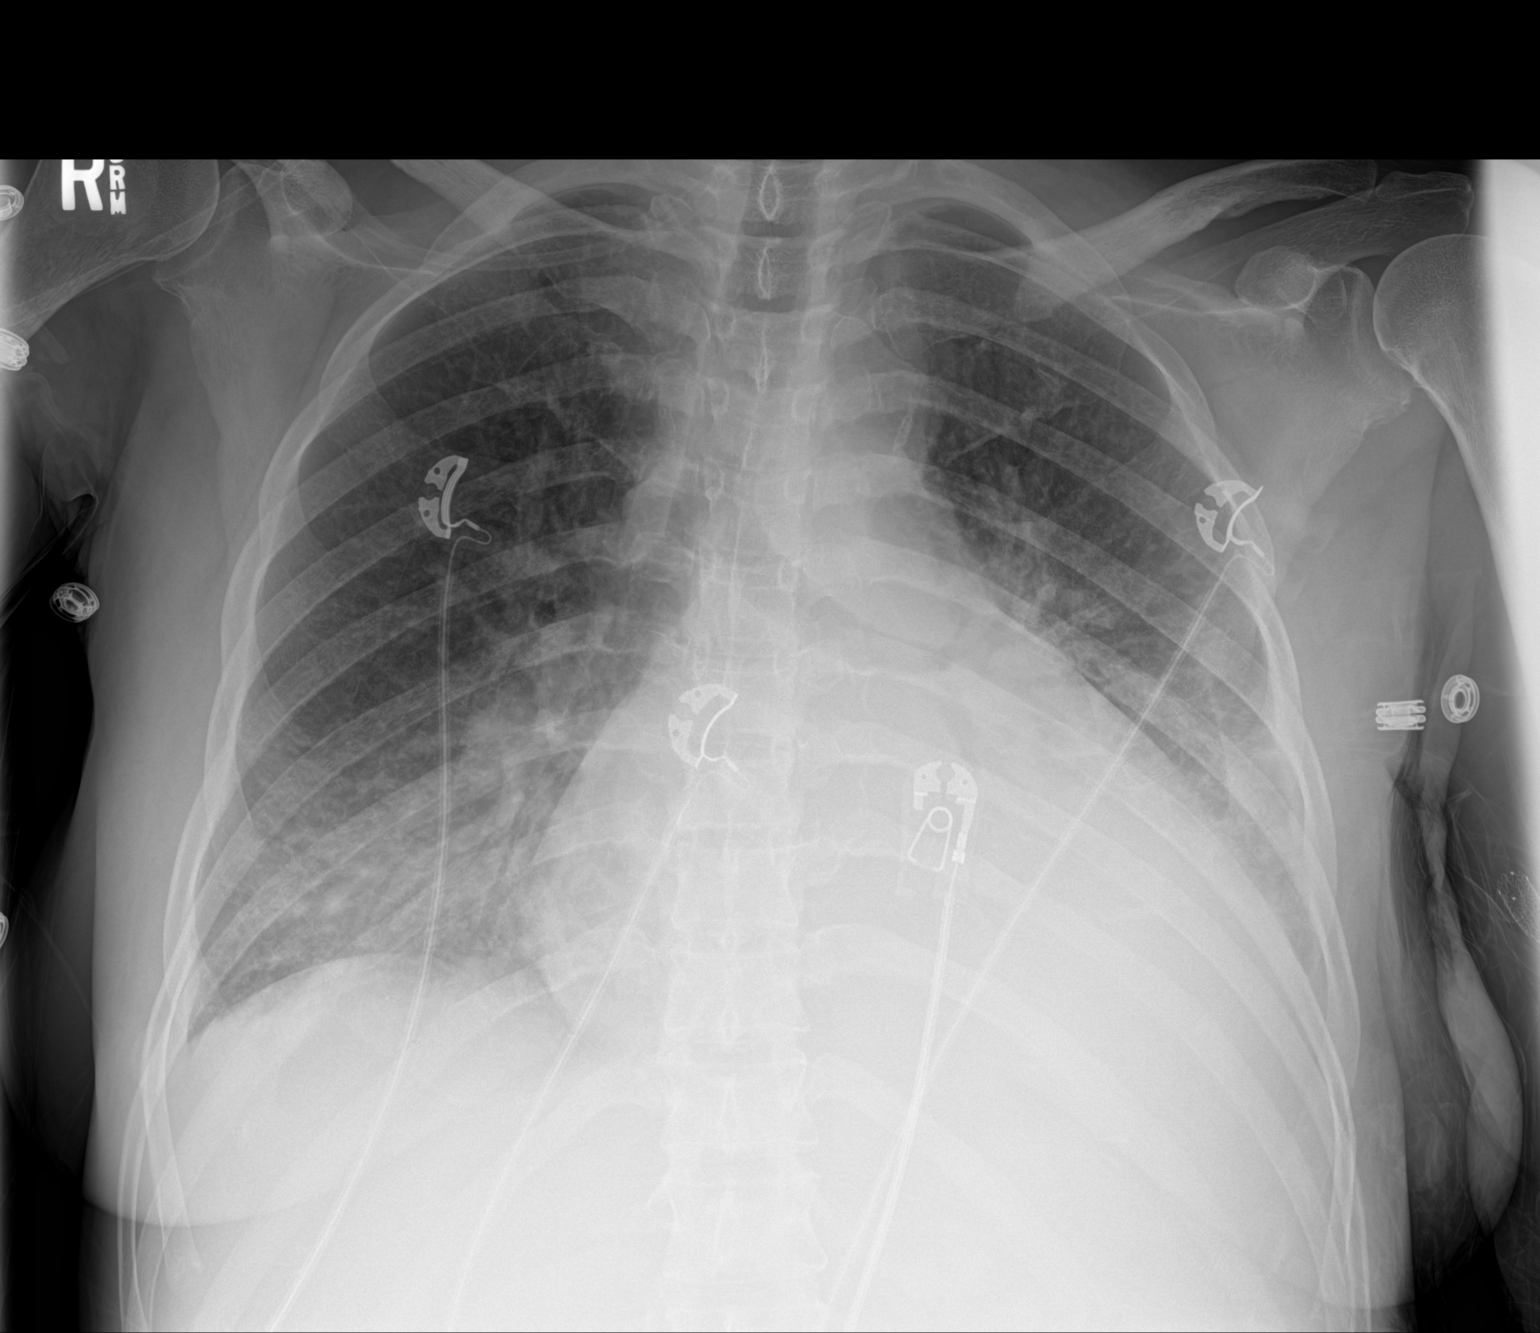

[1 of 1 positions shown; findings below may reference images not displayed]

FINDINGS: Cardiac shadow is enlarged but stable. Increased vascular congestion
is noted. Bibasilar infiltrates are seen with associated effusions.
No bony abnormality is noted.
IMPRESSION: Increased vascular congestion.

Bibasilar infiltrates left greater than right.

## 2018-11-16 IMAGING — DX DG CHEST 2V
2 series · 2 of 2 positions shown · non-contrast
Comparison: 07/16/2017

CLINICAL DATA: Shortness of breath, cough for 5 days

EXAM:
CHEST  2 VIEW

[chest lat]
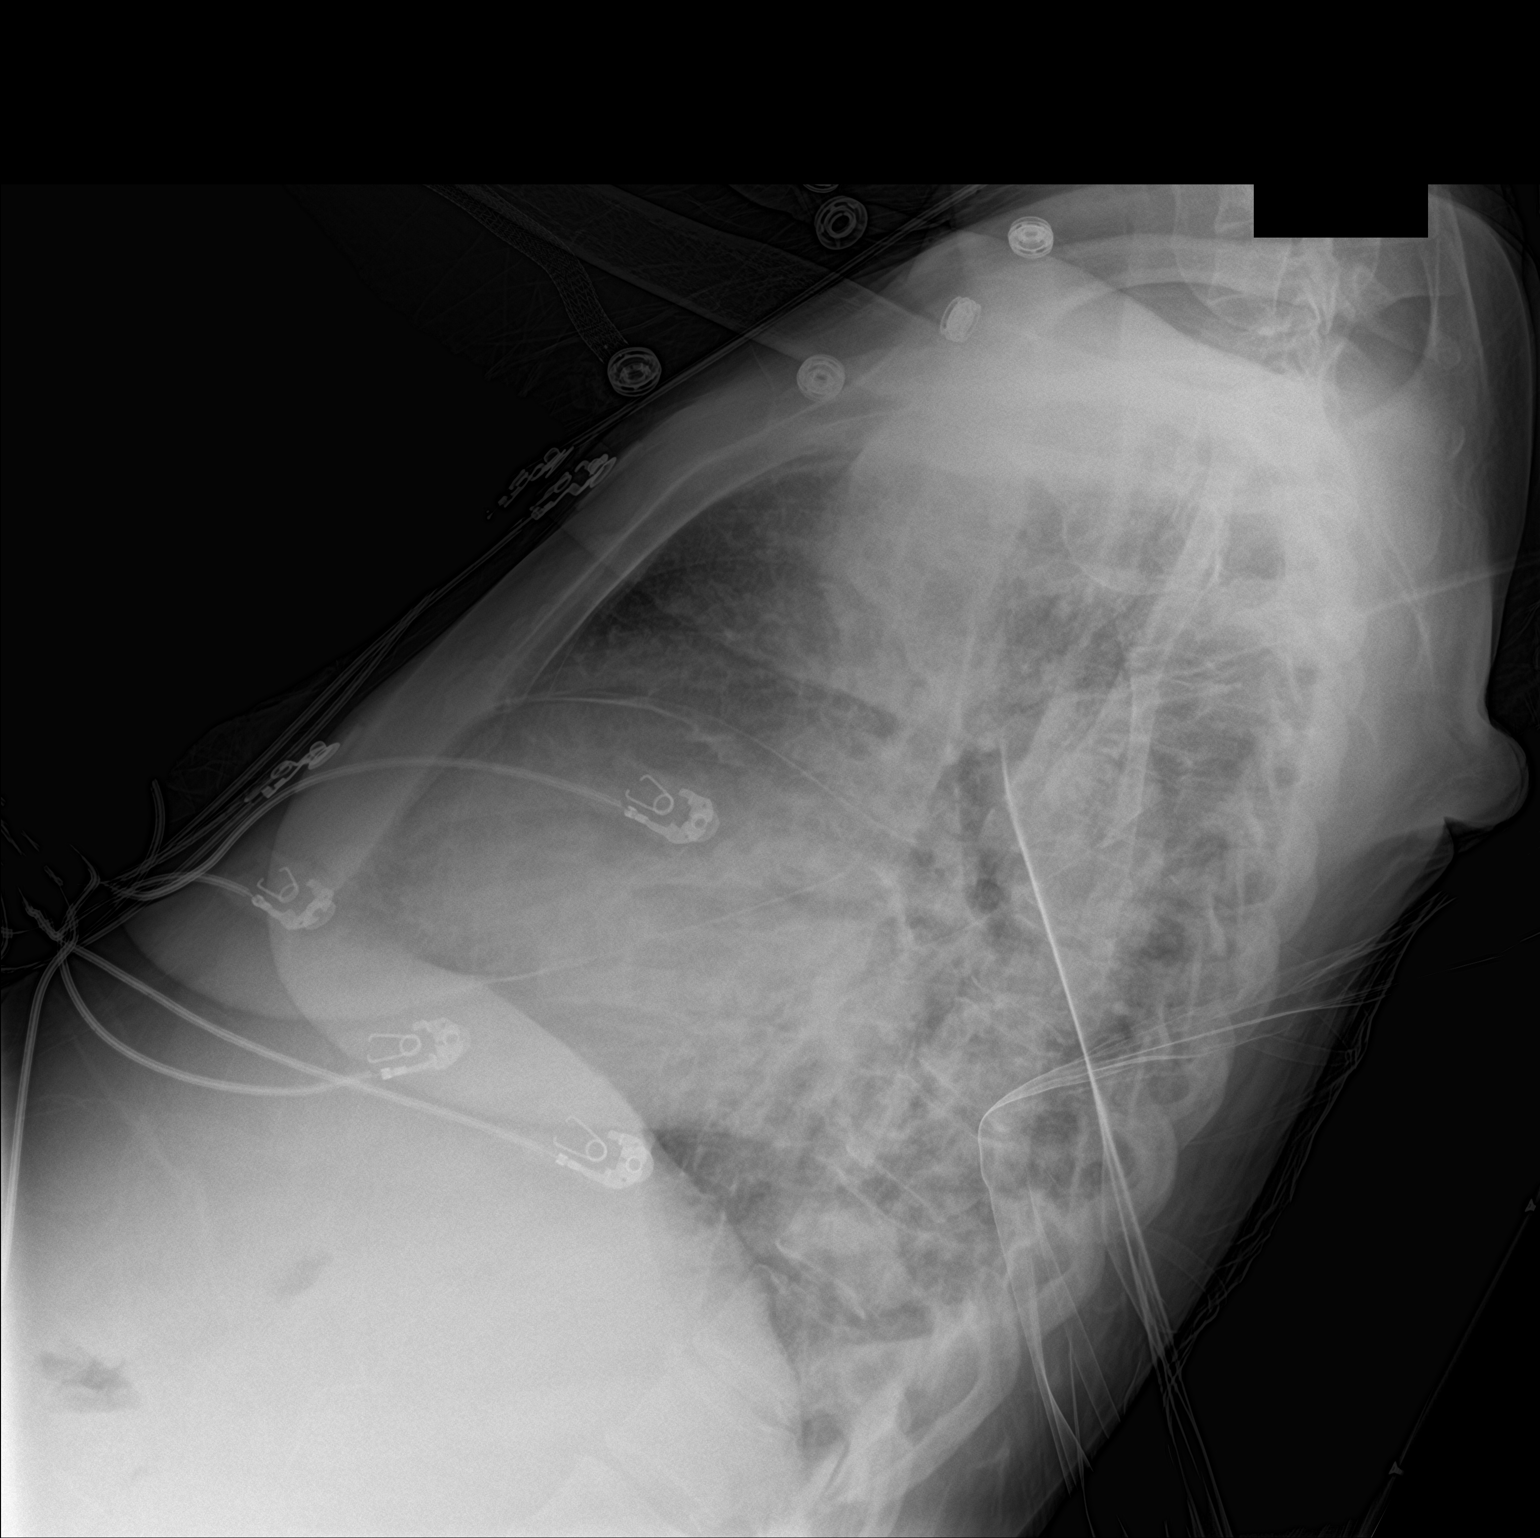

[chest ap]
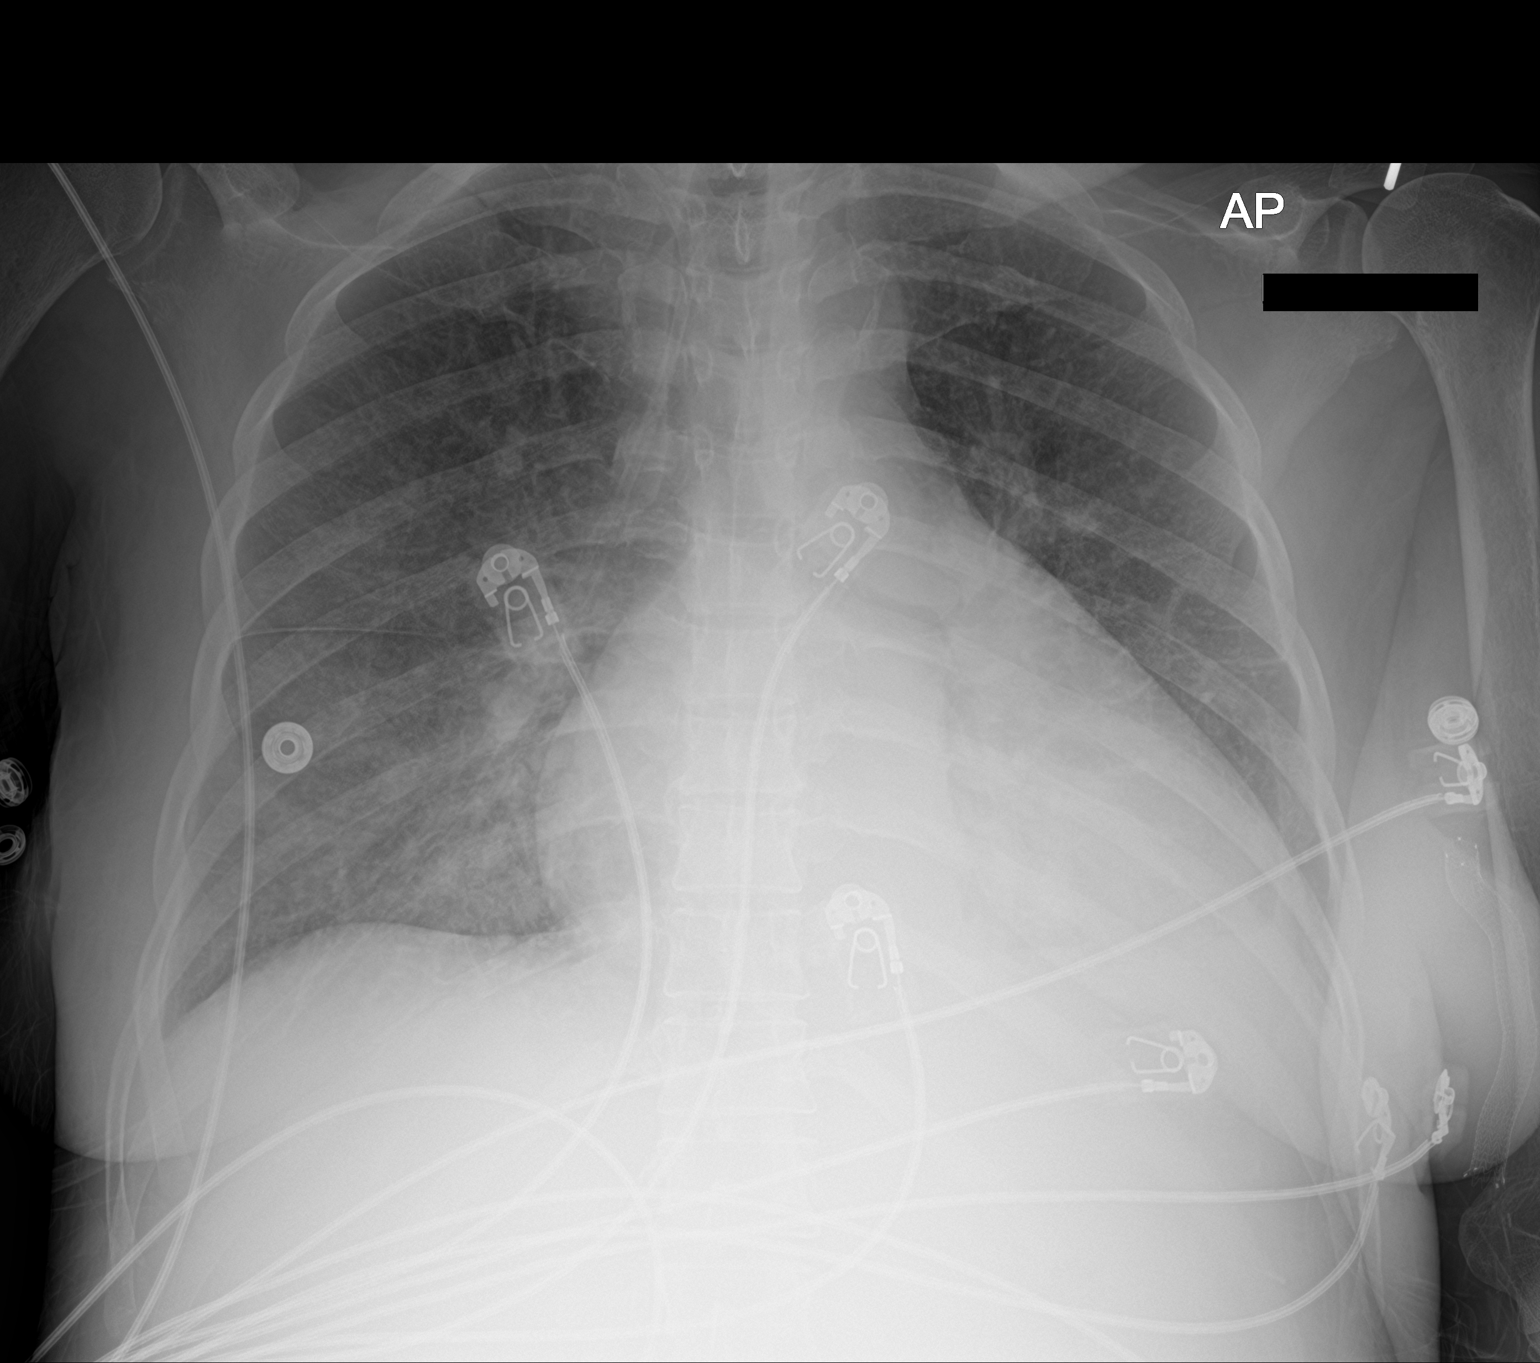

[2 of 2 positions shown; findings below may reference images not displayed]

FINDINGS: Cardiomegaly with vascular congestion. Interstitial prominence
likely reflects mild interstitial edema. No significant effusions.
No acute bony abnormality.
IMPRESSION: Cardiomegaly with vascular congestion and suspected mild
interstitial edema.

## 2018-11-17 ENCOUNTER — Encounter (HOSPITAL_COMMUNITY): Payer: Self-pay | Admitting: Emergency Medicine

## 2018-11-17 ENCOUNTER — Inpatient Hospital Stay (HOSPITAL_COMMUNITY)
Admission: EM | Admit: 2018-11-17 | Discharge: 2018-11-20 | DRG: 377 | Disposition: A | Discharge status: Skilled Nursing Facility | Payer: Medicare Other | Attending: Internal Medicine | Admitting: Internal Medicine

## 2018-11-17 ENCOUNTER — Other Ambulatory Visit: Payer: Self-pay

## 2018-11-17 DIAGNOSIS — K921 Melena: Secondary | ICD-10-CM | POA: Diagnosis not present

## 2018-11-17 DIAGNOSIS — Z90710 Acquired absence of both cervix and uterus: Secondary | ICD-10-CM

## 2018-11-17 DIAGNOSIS — K922 Gastrointestinal hemorrhage, unspecified: Secondary | ICD-10-CM | POA: Diagnosis present

## 2018-11-17 DIAGNOSIS — E1151 Type 2 diabetes mellitus with diabetic peripheral angiopathy without gangrene: Secondary | ICD-10-CM | POA: Diagnosis present

## 2018-11-17 DIAGNOSIS — F259 Schizoaffective disorder, unspecified: Secondary | ICD-10-CM | POA: Diagnosis present

## 2018-11-17 DIAGNOSIS — D631 Anemia in chronic kidney disease: Secondary | ICD-10-CM | POA: Diagnosis present

## 2018-11-17 DIAGNOSIS — K2990 Gastroduodenitis, unspecified, without bleeding: Secondary | ICD-10-CM

## 2018-11-17 DIAGNOSIS — E1140 Type 2 diabetes mellitus with diabetic neuropathy, unspecified: Secondary | ICD-10-CM | POA: Diagnosis present

## 2018-11-17 DIAGNOSIS — D62 Acute posthemorrhagic anemia: Secondary | ICD-10-CM

## 2018-11-17 DIAGNOSIS — Z992 Dependence on renal dialysis: Secondary | ICD-10-CM

## 2018-11-17 DIAGNOSIS — E1122 Type 2 diabetes mellitus with diabetic chronic kidney disease: Secondary | ICD-10-CM | POA: Diagnosis present

## 2018-11-17 DIAGNOSIS — Z9851 Tubal ligation status: Secondary | ICD-10-CM

## 2018-11-17 DIAGNOSIS — E875 Hyperkalemia: Secondary | ICD-10-CM | POA: Diagnosis present

## 2018-11-17 DIAGNOSIS — N186 End stage renal disease: Secondary | ICD-10-CM | POA: Diagnosis present

## 2018-11-17 DIAGNOSIS — E876 Hypokalemia: Secondary | ICD-10-CM | POA: Diagnosis not present

## 2018-11-17 DIAGNOSIS — K5731 Diverticulosis of large intestine without perforation or abscess with bleeding: Secondary | ICD-10-CM | POA: Diagnosis present

## 2018-11-17 DIAGNOSIS — Z89439 Acquired absence of unspecified foot: Secondary | ICD-10-CM

## 2018-11-17 DIAGNOSIS — D696 Thrombocytopenia, unspecified: Secondary | ICD-10-CM | POA: Diagnosis present

## 2018-11-17 DIAGNOSIS — D124 Benign neoplasm of descending colon: Secondary | ICD-10-CM

## 2018-11-17 DIAGNOSIS — K2981 Duodenitis with bleeding: Secondary | ICD-10-CM | POA: Diagnosis present

## 2018-11-17 DIAGNOSIS — H409 Unspecified glaucoma: Secondary | ICD-10-CM | POA: Diagnosis present

## 2018-11-17 DIAGNOSIS — Z833 Family history of diabetes mellitus: Secondary | ICD-10-CM

## 2018-11-17 DIAGNOSIS — Z794 Long term (current) use of insulin: Secondary | ICD-10-CM

## 2018-11-17 DIAGNOSIS — I132 Hypertensive heart and chronic kidney disease with heart failure and with stage 5 chronic kidney disease, or end stage renal disease: Secondary | ICD-10-CM | POA: Diagnosis present

## 2018-11-17 DIAGNOSIS — E118 Type 2 diabetes mellitus with unspecified complications: Secondary | ICD-10-CM | POA: Diagnosis present

## 2018-11-17 DIAGNOSIS — E119 Type 2 diabetes mellitus without complications: Secondary | ICD-10-CM

## 2018-11-17 DIAGNOSIS — G894 Chronic pain syndrome: Secondary | ICD-10-CM | POA: Diagnosis present

## 2018-11-17 DIAGNOSIS — N2581 Secondary hyperparathyroidism of renal origin: Secondary | ICD-10-CM | POA: Diagnosis present

## 2018-11-17 DIAGNOSIS — K2971 Gastritis, unspecified, with bleeding: Principal | ICD-10-CM | POA: Diagnosis present

## 2018-11-17 DIAGNOSIS — G40909 Epilepsy, unspecified, not intractable, without status epilepticus: Secondary | ICD-10-CM

## 2018-11-17 DIAGNOSIS — F419 Anxiety disorder, unspecified: Secondary | ICD-10-CM | POA: Diagnosis present

## 2018-11-17 DIAGNOSIS — D123 Benign neoplasm of transverse colon: Secondary | ICD-10-CM | POA: Diagnosis present

## 2018-11-17 DIAGNOSIS — E7800 Pure hypercholesterolemia, unspecified: Secondary | ICD-10-CM | POA: Diagnosis present

## 2018-11-17 DIAGNOSIS — K2970 Gastritis, unspecified, without bleeding: Secondary | ICD-10-CM

## 2018-11-17 DIAGNOSIS — K644 Residual hemorrhoidal skin tags: Secondary | ICD-10-CM | POA: Diagnosis present

## 2018-11-17 DIAGNOSIS — K6389 Other specified diseases of intestine: Secondary | ICD-10-CM | POA: Diagnosis present

## 2018-11-17 DIAGNOSIS — Z8673 Personal history of transient ischemic attack (TIA), and cerebral infarction without residual deficits: Secondary | ICD-10-CM

## 2018-11-17 DIAGNOSIS — D122 Benign neoplasm of ascending colon: Secondary | ICD-10-CM | POA: Diagnosis present

## 2018-11-17 DIAGNOSIS — I5042 Chronic combined systolic (congestive) and diastolic (congestive) heart failure: Secondary | ICD-10-CM | POA: Diagnosis present

## 2018-11-17 DIAGNOSIS — Z79899 Other long term (current) drug therapy: Secondary | ICD-10-CM

## 2018-11-17 DIAGNOSIS — K219 Gastro-esophageal reflux disease without esophagitis: Secondary | ICD-10-CM | POA: Diagnosis present

## 2018-11-17 DIAGNOSIS — B1920 Unspecified viral hepatitis C without hepatic coma: Secondary | ICD-10-CM | POA: Diagnosis present

## 2018-11-17 DIAGNOSIS — F17210 Nicotine dependence, cigarettes, uncomplicated: Secondary | ICD-10-CM | POA: Diagnosis present

## 2018-11-17 DIAGNOSIS — K2991 Gastroduodenitis, unspecified, with bleeding: Secondary | ICD-10-CM | POA: Diagnosis present

## 2018-11-17 DIAGNOSIS — M898X9 Other specified disorders of bone, unspecified site: Secondary | ICD-10-CM | POA: Diagnosis present

## 2018-11-17 DIAGNOSIS — F329 Major depressive disorder, single episode, unspecified: Secondary | ICD-10-CM | POA: Diagnosis present

## 2018-11-17 DIAGNOSIS — Z8249 Family history of ischemic heart disease and other diseases of the circulatory system: Secondary | ICD-10-CM

## 2018-11-17 LAB — CBC
HCT: 19.7 % — ABNORMAL LOW (ref 36.0–46.0)
Hemoglobin: 5.9 g/dL — CL (ref 12.0–15.0)
MCH: 31.6 pg (ref 26.0–34.0)
MCHC: 29.9 g/dL — ABNORMAL LOW (ref 30.0–36.0)
MCV: 105.3 fL — ABNORMAL HIGH (ref 80.0–100.0)
Platelets: 129 10*3/uL — ABNORMAL LOW (ref 150–400)
RBC: 1.87 MIL/uL — ABNORMAL LOW (ref 3.87–5.11)
RDW: 15.9 % — ABNORMAL HIGH (ref 11.5–15.5)
WBC: 3.1 10*3/uL — ABNORMAL LOW (ref 4.0–10.5)
nRBC: 0 % (ref 0.0–0.2)

## 2018-11-17 LAB — PROTIME-INR
INR: 1.2 (ref 0.8–1.2)
Prothrombin Time: 14.7 seconds (ref 11.4–15.2)

## 2018-11-17 LAB — PREPARE RBC (CROSSMATCH)

## 2018-11-17 LAB — GLUCOSE, CAPILLARY
Glucose-Capillary: 140 mg/dL — ABNORMAL HIGH (ref 70–99)
Glucose-Capillary: 96 mg/dL (ref 70–99)
Glucose-Capillary: 151 mg/dL — ABNORMAL HIGH (ref 70–99)

## 2018-11-17 LAB — COMPREHENSIVE METABOLIC PANEL
ALT: 30 U/L (ref 0–44)
AST: 38 U/L (ref 15–41)
Albumin: 3.3 g/dL — ABNORMAL LOW (ref 3.5–5.0)
Alkaline Phosphatase: 116 U/L (ref 38–126)
Anion gap: 16 — ABNORMAL HIGH (ref 5–15)
BUN: 82 mg/dL — ABNORMAL HIGH (ref 6–20)
CO2: 29 mmol/L (ref 22–32)
Calcium: 8.9 mg/dL (ref 8.9–10.3)
Chloride: 87 mmol/L — ABNORMAL LOW (ref 98–111)
Creatinine, Ser: 9.01 mg/dL — ABNORMAL HIGH (ref 0.44–1.00)
GFR calc Af Amer: 5 mL/min — ABNORMAL LOW (ref >60)
GFR calc non Af Amer: 5 mL/min — ABNORMAL LOW (ref >60)
Glucose, Bld: 220 mg/dL — ABNORMAL HIGH (ref 70–99)
Potassium: 6.1 mmol/L — ABNORMAL HIGH (ref 3.5–5.1)
Sodium: 132 mmol/L — ABNORMAL LOW (ref 135–145)
Total Bilirubin: 0.9 mg/dL (ref 0.3–1.2)
Total Protein: 6.6 g/dL (ref 6.5–8.1)

## 2018-11-17 LAB — IRON AND TIBC
Iron: 78 ug/dL (ref 28–170)
Saturation Ratios: 25 % (ref 10.4–31.8)
TIBC: 307 ug/dL (ref 250–450)
UIBC: 229 ug/dL

## 2018-11-17 LAB — HEMOGLOBIN A1C
Hgb A1c MFr Bld: 5.9 % — ABNORMAL HIGH (ref 4.8–5.6)
Mean Plasma Glucose: 122.63 mg/dL

## 2018-11-17 LAB — HEPATITIS B SURFACE ANTIGEN: Hepatitis B Surface Ag: NEGATIVE

## 2018-11-17 LAB — LIPASE, BLOOD: Lipase: 58 U/L — ABNORMAL HIGH (ref 11–51)

## 2018-11-17 LAB — MRSA PCR SCREENING: MRSA by PCR: NEGATIVE

## 2018-11-17 MED ORDER — DULOXETINE HCL 60 MG PO CPEP
90.0000 mg | ORAL_CAPSULE | Freq: Every day | ORAL | Status: DC
  Administered 2018-11-18 – 2018-11-20 (×3): 90 mg via ORAL
  Filled 2018-11-17 (×3): qty 1

## 2018-11-17 MED ORDER — ALLOPURINOL 100 MG PO TABS
100.0000 mg | ORAL_TABLET | ORAL | Status: DC
  Administered 2018-11-19: 100 mg via ORAL
  Filled 2018-11-17 (×2): qty 1

## 2018-11-17 MED ORDER — CAMPHOR-MENTHOL 0.5-0.5 % EX LOTN
1.0000 "application " | TOPICAL_LOTION | Freq: Three times a day (TID) | CUTANEOUS | Status: DC | PRN
  Filled 2018-11-17: qty 222

## 2018-11-17 MED ORDER — CALCIUM ACETATE (PHOS BINDER) 667 MG PO CAPS
1334.0000 mg | ORAL_CAPSULE | Freq: Three times a day (TID) | ORAL | Status: DC
  Administered 2018-11-18: 667 mg via ORAL
  Administered 2018-11-20: 1334 mg via ORAL
  Filled 2018-11-17 (×2): qty 2

## 2018-11-17 MED ORDER — AMLODIPINE BESYLATE 10 MG PO TABS
10.0000 mg | ORAL_TABLET | Freq: Every day | ORAL | Status: DC
  Filled 2018-11-17 (×3): qty 1

## 2018-11-17 MED ORDER — LORAZEPAM 0.5 MG PO TABS: 0.5000 mg | ORAL_TABLET | Freq: Three times a day (TID) | ORAL | Status: DC | PRN

## 2018-11-17 MED ORDER — DEXTROSE 50 % IV SOLN
1.0000 | Freq: Once | INTRAVENOUS | Status: AC
  Administered 2018-11-17: 50 mL via INTRAVENOUS
  Filled 2018-11-17: qty 50

## 2018-11-17 MED ORDER — PEG-KCL-NACL-NASULF-NA ASC-C 100 G PO SOLR: 1.0000 | Freq: Once | ORAL | Status: DC

## 2018-11-17 MED ORDER — RENA-VITE PO TABS
1.0000 | ORAL_TABLET | Freq: Every day | ORAL | Status: DC
  Administered 2018-11-17 – 2018-11-19 (×3): 1 via ORAL
  Filled 2018-11-17 (×3): qty 1

## 2018-11-17 MED ORDER — SODIUM CHLORIDE 0.9% IV SOLUTION: Freq: Once | INTRAVENOUS | Status: DC

## 2018-11-17 MED ORDER — METHOCARBAMOL 500 MG PO TABS: 500.0000 mg | ORAL_TABLET | Freq: Three times a day (TID) | ORAL | Status: DC | PRN

## 2018-11-17 MED ORDER — ATORVASTATIN CALCIUM 40 MG PO TABS
40.0000 mg | ORAL_TABLET | Freq: Every day | ORAL | Status: DC
  Administered 2018-11-17 – 2018-11-19 (×3): 40 mg via ORAL
  Filled 2018-11-17 (×3): qty 1

## 2018-11-17 MED ORDER — ACETAMINOPHEN 325 MG PO TABS
650.0000 mg | ORAL_TABLET | Freq: Four times a day (QID) | ORAL | Status: DC | PRN
  Filled 2018-11-17: qty 2

## 2018-11-17 MED ORDER — PEG-KCL-NACL-NASULF-NA ASC-C 100 G PO SOLR
0.5000 | Freq: Once | ORAL | Status: AC
  Administered 2018-11-17: 100 g via ORAL
  Filled 2018-11-17: qty 1

## 2018-11-17 MED ORDER — ACETAMINOPHEN 650 MG RE SUPP: 650.0000 mg | Freq: Four times a day (QID) | RECTAL | Status: DC | PRN

## 2018-11-17 MED ORDER — NEPRO/CARBSTEADY PO LIQD: 237.0000 mL | Freq: Three times a day (TID) | ORAL | Status: DC | PRN

## 2018-11-17 MED ORDER — ONDANSETRON HCL 4 MG/2ML IJ SOLN
4.0000 mg | Freq: Four times a day (QID) | INTRAMUSCULAR | Status: DC | PRN
  Administered 2018-11-17: 4 mg via INTRAVENOUS
  Filled 2018-11-17: qty 2

## 2018-11-17 MED ORDER — DULOXETINE HCL 60 MG PO CPEP: 90.0000 mg | ORAL_CAPSULE | Freq: Every day | ORAL | Status: DC

## 2018-11-17 MED ORDER — CALCITRIOL 0.5 MCG PO CAPS
ORAL_CAPSULE | ORAL | Status: AC
  Administered 2018-11-17: 2 ug via ORAL
  Filled 2018-11-17: qty 4

## 2018-11-17 MED ORDER — ONDANSETRON HCL 4 MG PO TABS: 4.0000 mg | ORAL_TABLET | Freq: Four times a day (QID) | ORAL | Status: DC | PRN

## 2018-11-17 MED ORDER — CALCITRIOL 0.25 MCG PO CAPS
2.0000 ug | ORAL_CAPSULE | ORAL | Status: DC
  Administered 2018-11-17: 2 ug via ORAL

## 2018-11-17 MED ORDER — TRAZODONE HCL 50 MG PO TABS
50.0000 mg | ORAL_TABLET | Freq: Every day | ORAL | Status: DC
  Filled 2018-11-17 (×3): qty 1

## 2018-11-17 MED ORDER — INSULIN ASPART 100 UNIT/ML ~~LOC~~ SOLN
0.0000 [IU] | Freq: Three times a day (TID) | SUBCUTANEOUS | Status: DC
  Administered 2018-11-18 – 2018-11-19 (×3): 3 [IU] via SUBCUTANEOUS

## 2018-11-17 MED ORDER — OXYCODONE HCL 5 MG PO TABS
10.0000 mg | ORAL_TABLET | Freq: Four times a day (QID) | ORAL | Status: DC | PRN
  Administered 2018-11-17 – 2018-11-19 (×2): 10 mg via ORAL
  Filled 2018-11-17: qty 2

## 2018-11-17 MED ORDER — PEG-KCL-NACL-NASULF-NA ASC-C 100 G PO SOLR
0.5000 | Freq: Once | ORAL | Status: AC
  Administered 2018-11-18: 100 g via ORAL
  Filled 2018-11-17: qty 1

## 2018-11-17 MED ORDER — DOXERCALCIFEROL 4 MCG/2ML IV SOLN
5.0000 ug | Freq: Once | INTRAVENOUS | Status: AC
  Administered 2018-11-17: 5 ug via INTRAVENOUS
  Filled 2018-11-17: qty 4

## 2018-11-17 MED ORDER — DOCUSATE SODIUM 283 MG RE ENEM
1.0000 | ENEMA | RECTAL | Status: DC | PRN
  Filled 2018-11-17: qty 1

## 2018-11-17 MED ORDER — HYDROXYZINE HCL 25 MG PO TABS
25.0000 mg | ORAL_TABLET | Freq: Three times a day (TID) | ORAL | Status: DC | PRN
  Administered 2018-11-19: 25 mg via ORAL

## 2018-11-17 MED ORDER — CHLORHEXIDINE GLUCONATE CLOTH 2 % EX PADS
6.0000 | MEDICATED_PAD | Freq: Every day | CUTANEOUS | Status: DC
  Administered 2018-11-18 – 2018-11-20 (×3): 6 via TOPICAL

## 2018-11-17 MED ORDER — CALCITRIOL 0.5 MCG PO CAPS: 1.7500 ug | ORAL_CAPSULE | ORAL | Status: DC

## 2018-11-17 MED ORDER — SORBITOL 70 % SOLN
30.0000 mL | Status: DC | PRN
  Filled 2018-11-17: qty 30

## 2018-11-17 MED ORDER — DOXERCALCIFEROL 4 MCG/2ML IV SOLN
INTRAVENOUS | Status: AC
  Administered 2018-11-17: 5 ug via INTRAVENOUS
  Filled 2018-11-17: qty 4

## 2018-11-17 MED ORDER — INSULIN DETEMIR 100 UNIT/ML ~~LOC~~ SOLN
12.0000 [IU] | Freq: Every day | SUBCUTANEOUS | Status: DC
  Administered 2018-11-18 – 2018-11-19 (×2): 12 [IU] via SUBCUTANEOUS
  Filled 2018-11-17 (×4): qty 0.12

## 2018-11-17 MED ORDER — CALCIUM CARBONATE ANTACID 1250 MG/5ML PO SUSP
500.0000 mg | Freq: Four times a day (QID) | ORAL | Status: DC | PRN
  Filled 2018-11-17: qty 5

## 2018-11-17 MED ORDER — LABETALOL HCL 200 MG PO TABS
200.0000 mg | ORAL_TABLET | Freq: Two times a day (BID) | ORAL | Status: DC
  Administered 2018-11-17 – 2018-11-20 (×6): 200 mg via ORAL
  Filled 2018-11-17 (×6): qty 1

## 2018-11-17 MED ORDER — INSULIN ASPART 100 UNIT/ML IV SOLN
5.0000 [IU] | Freq: Once | INTRAVENOUS | Status: AC
  Administered 2018-11-17: 5 [IU] via INTRAVENOUS

## 2018-11-17 MED ORDER — ALBUTEROL SULFATE (2.5 MG/3ML) 0.083% IN NEBU: 3.0000 mL | INHALATION_SOLUTION | RESPIRATORY_TRACT | Status: DC | PRN

## 2018-11-17 MED ORDER — ZOLPIDEM TARTRATE 5 MG PO TABS: 5.0000 mg | ORAL_TABLET | Freq: Every evening | ORAL | Status: DC | PRN

## 2018-11-17 NOTE — Consult Note (Addendum)
[facility] Renal Consultation Note    Indication for Consultation:  Management of ESRD/hemodialysis; anemia, hypertension/volume and secondary hyperparathyroidism PCP:  HPI: [name] is a 53 y.o. female with ESRD on hemodialysis T,Th,S at [facility]. Past medical history of HTN, DM, bipolar disorder, schizoaffective disorder, polysubstance abuse, hepatitis C, medical noncompliance. Was admitted 03/02-03/02/2019 for LGIB. HGB 4.2 on admission. She did receive blood last admission. Previous history of refusing blood D/T being Jehovah Witness.Tagged blood scan in nuclear med negative for bleeding. Last HGB at OP HD center 9.4 10/29/2018. Denies recent polysubstance abuse. Has been at [facility] but planning to leave to go to her own apartment.  Last HD 11/14/2018. Stayed full treatment, however missed HD 11/12/2018, truncated treatment 11/10/2018.    Patient presented to ED today after bleeding noted after BM yesterday. Apparently bleeding continued about every 20 minutes until last stool today in ambulance to hospital. She has had no further bloody BMs since arrival to hospital. Upon arrival to hospital, HGB noted to be 5.9 K+ 6.1 BS 220. She did not attend HD today D/T bloody stools.   Currently she is awake, alert, quite animated. She actually looks well. She denies SOB, chest pain, no weakness, denies abdominal pain at present. She denies fevers, cough, chills. She has been seen by [facility] who plans for EGD and colonoscopy tomorrow. She agrees to receive blood transfusion again. She will have urgent HD for hyperkalemia today and receive blood transfusion during HD.    Past Medical History:  Diagnosis Date  . Anemia   . Anxiety   . Bipolar disorder, unspecified ([place]) 02/06/2014  . Chronic combined systolic and diastolic CHF (congestive heart failure) (HCC)    EF 40% by echo and 48% by stress test  . Chronic pain syndrome   . Depression   . ESRD on  hemodialysis (HCC)    TTS  . GERD (gastroesophageal reflux disease)   . Glaucoma   . Headache    sinus headaches  . Hepatitis C   . High cholesterol   . History of hiatal hernia   . History of vitamin D deficiency 06/20/2015  . Hypertension   . MRSA infection ?2014   "on the back of my head; spread throughout my bloodstream"  . Neuropathy   . Refusal of blood transfusions as patient is Jehovah's Witness   . Schizoaffective disorder, unspecified condition 02/08/2014  . Seizure [facility]) dx'd 2014   "don't know what kind; last one was ~ 04/2014"  . TIA (transient ischemic attack) 2010  . Type II diabetes mellitus ([place])    INSULIN DEPENDENT   Past Surgical History:  Procedure Laterality Date  . ABDOMINAL HYSTERECTOMY  2014  . ABDOMINAL HYSTERECTOMY  2010  . AMPUTATION Left 05/21/2013   Procedure: Left Midfoot AMPUTATION;  Surgeon: Newt Minion, MD;  Location: [place];  Service: Orthopedics;  Laterality: Left;  Left Midfoot Amputation  . AV FISTULA PLACEMENT Left 05/04/2015   Procedure: ARTERIOVENOUS (AV) GRAFT INSERTION;  Surgeon: Angelia Mould, MD;  Location: [place];  Service: Vascular;  Laterality: Left;  . COLONOSCOPY WITH PROPOFOL N/A 06/22/2013   Procedure: COLONOSCOPY WITH PROPOFOL;  Surgeon: Jeryl Columbia, MD;  Location: [facility] ENDOSCOPY;  Service: Endoscopy;  Laterality: N/A;  . ESOPHAGOGASTRODUODENOSCOPY N/A 05/11/2015   Procedure: ESOPHAGOGASTRODUODENOSCOPY (EGD);  Surgeon: Laurence Spates, MD;  Location[facility] END[facility]COPY;  Service: Endoscopy;  Laterality: N/A;  . EYE SURGERY Bilateral 2010   Lasik  . FLEXIBLE SIGMOIDOSCOPY N/A 01/17/2017  Procedure: FLEXIBLE SIGMOIDOSCOPY w/ hemorrhoid banding possible;  Surgeon: Gatha Mayer, MD;  Location: [facility] ENDOSCOPY;  Service: Endoscopy;  Laterality: N/A;  . FOOT AMPUTATION Left    due diabetic neuropathy, could not feel ulcer on bottom of foot  . REFRACTIVE SURGERY Bilateral 2013  . TONSILLECTOMY  1970's  . TUBAL LIGATION  2000   Family  History  Problem Relation Age of Onset  . Hypertension Mother   . Diabetes Mother   . Hypertension Father   . Diabetes Father    Social History:  reports that she has been smoking cigarettes. She has a 36.00 pack-year smoking history. She has never used smokeless tobacco. She reports current alcohol use of about 3.0 standard drinks of alcohol per week. She reports previous drug use. Drugs: "Crack" cocaine and Cocaine. Allergies  Allergen Reactions  . Penicillins Hives and Other (See Comments)    Has patient had a PCN reaction causing immediate rash, facial/tongue/throat swelling, SOB or lightheadedness with hypotension: Yes Has patient had a PCN reaction causing severe rash involving mucus membranes or skin necrosis: No Has patient had a PCN reaction that required hospitalization No Has patient had a PCN reaction occurring within the last 10 years: No If all of the above answers are "NO", then may proceed with Cephalosporin use. Pt tolerates cefepime and ceftriaxone  . Gabapentin Itching and Swelling  . Lyrica [Pregabalin] Swelling and Other (See Comments)    Facial and leg swelling  . Saphris [Asenapine] Swelling and Other (See Comments)    Facial swelling  . Tramadol Swelling and Other (See Comments)    Leg swelling  . Adhesive [Tape] Itching    SEVERE ITCHING  . Latex Itching    SEVERE ITCHING  . Other Other (See Comments)    NO BLOOD PRODUCTS; PATIENT IS A JEHOVAH'S WITNESS   Prior to Admission medications   Medication Sig Start Date End Date Taking? Authorizing Provider  albuterol (PROVENTIL HFA;VENTOLIN HFA) 108 (90 Base) MCG/ACT inhaler Inhale 2 puffs into the lungs every 4 (four) hours as needed for wheezing or shortness of breath. 10/15/16  Yes Horton, Barbette Hair, MD  allopurinol (ZYLOPRIM) 100 MG tablet Take 1 tablet (100 mg total) by mouth every Tuesday, Thursday, and Saturday at 6 PM. 01/25/17  Yes Nita Sells, MD  ALPRAZolam Duanne Moron) 0.5 MG tablet Take 0.5 mg by  mouth every 8 (eight) hours as needed for anxiety.   Yes [provider]  amLODipine (NORVASC) 10 MG tablet Take 1 tablet (10 mg total) by mouth at bedtime. 09/02/16  Yes Hildred Priest, MD  atorvastatin (LIPITOR) 40 MG tablet Take 1 tablet (40 mg total) by mouth at bedtime. 09/02/16  Yes Hildred Priest, MD  bisacodyl (DULCOLAX) 10 MG suppository Place 10 mg rectally daily as needed (for constipation).   Yes [provider]  calcium acetate (PHOSLO) 667 MG capsule Take 2 capsules (1,334 mg total) by mouth 3 (three) times daily with meals. 09/02/16  Yes Hildred Priest, MD  diphenhydrAMINE (BENADRYL) 25 mg capsule Take 25 mg by mouth every 6 (six) hours as needed for itching.    Yes [provider]  docusate sodium (COLACE) 100 MG capsule Take 100 mg by mouth 2 (two) times daily.   Yes [provider]  DULoxetine (CYMBALTA) 30 MG capsule Take 90 mg by mouth daily. Take with 60mg  to equal 90mg  once daily   Yes [provider]  DULoxetine (CYMBALTA) 60 MG capsule Take 60 mg by mouth daily. Take one  capsule with 30mg  to equal 90mg .   Yes [provider]  epoetin alfa (EPOGEN,PROCRIT) 25003 UNIT/ML injection Inject 1 mL (20,000 Units total) into the vein Every Tuesday,Thursday,and Saturday with dialysis. 10/10/18  Yes Ghimire, Henreitta Leber, MD  Ferrous Sulfate (SLOW FE) 142 (45 Fe) MG TBCR Take 1 tablet by mouth daily. 07/19/17  Yes Mariel Aloe, MD  folic acid (FOLVITE) 1 MG tablet Take 1 tablet (1 mg total) by mouth daily. 10/10/18  Yes Ghimire, Henreitta Leber, MD  hydrocortisone (ANUSOL-HC) 2.5 % rectal cream Place rectally 2 (two) times daily. Patient taking differently: Place 1 application rectally 2 (two) times daily as needed for hemorrhoids.  01/24/17  Yes Nita Sells, MD  insulin aspart (NOVOLOG) 100 UNIT/ML injection 0-9 Units, Subcutaneous, 3 times daily with meals CBG < 70: implement hypoglycemia protocol CBG  70 - 120: 0 units CBG 121 - 150: 1 unit CBG 151 - 200: 2 units CBG 201 - 250: 3 units CBG 251 - 300: 5 units CBG 301 - 350: 7 units CBG 351 - 400: 9 units CBG > 400: call MD 10/10/18  Yes Ghimire, Henreitta Leber, MD  Insulin Detemir (LEVEMIR FLEXTOUCH) 100 UNIT/ML Pen Inject 12 Units into the skin at bedtime. 10/10/18  Yes Ghimire, Henreitta Leber, MD  labetalol (NORMODYNE) 100 MG tablet Take 100 mg by mouth 2 (two) times daily.   Yes [provider]  methocarbamol (ROBAXIN) 500 MG tablet Take 500 mg by mouth 3 (three) times daily as needed for muscle spasms.   Yes [provider]  Multiple Vitamins-Minerals (CEROVITE SENIOR PO) Take 1 tablet by mouth at bedtime.   Yes [provider]  ondansetron (ZOFRAN) 4 MG tablet Take 4 mg by mouth every 8 (eight) hours as needed for nausea or vomiting.   Yes [provider]  Oxycodone HCl 10 MG TABS Take 1 tablet (10 mg total) by mouth every 6 (six) hours as needed (for pain). 10/10/18  Yes Ghimire, Henreitta Leber, MD  pantoprazole (PROTONIX) 40 MG tablet Take 1 tablet (40 mg total) by mouth daily at 6 (six) AM. 07/20/17  Yes Mariel Aloe, MD  saccharomyces boulardii (FLORASTOR) 250 MG capsule Take 250 mg by mouth daily.   Yes [provider]  senna (SENOKOT) 8.6 MG tablet Take 2 tablets by mouth daily.   Yes [provider]  traZODone (DESYREL) 50 MG tablet Take 0.5 tablets (25 mg total) by mouth at bedtime. Patient taking differently: Take 50 mg by mouth at bedtime.  07/19/17  Yes Mariel Aloe, MD  calcitRIOL (ROCALTROL) 0.25 MCG capsule Take 7 capsules (1.75 mcg total) by mouth every dialysis (3 times per week on TTS). 10/10/18   Ghimire, Henreitta Leber, MD  LORazepam (ATIVAN) 0.5 MG tablet Take 1 tablet (0.5 mg total) by mouth every 8 (eight) hours as needed for anxiety. Patient not taking: Reported on 11/17/2018 10/10/18   Jonetta Osgood, MD  multivitamin (RENA-VIT) TABS tablet Take 1 tablet by mouth at  bedtime. Patient not taking: Reported on 11/17/2018 09/02/16   Hildred Priest, MD  mupirocin ointment (BACTROBAN) 2 % Apply 1 application topically 2 (two) times daily. Patient not taking: Reported on 11/17/2018 09/02/18   Suzan Slick, NP   Current Facility-Administered Medications  Medication Dose Route Frequency Provider Last Rate Last Dose  . 0.9 %  sodium chloride infusion (Manually program via Guardrails IV Fluids)   Intravenous Once Karmen Bongo, MD      . acetaminophen (TYLENOL) tablet  650 mg  650 mg Oral Q6H PRN Karmen Bongo, MD       Or  . acetaminophen (TYLENOL) suppository 650 mg  650 mg Rectal Q6H PRN Karmen Bongo, MD      . albuterol (PROVENTIL) (2.5 MG/3ML) 0.083% nebulizer solution 3 mL  3 mL Inhalation Q4H PRN Karmen Bongo, MD      . allopurinol (ZYLOPRIM) tablet 100 mg  100 mg Oral Q Verdie Shire, MD      . amLODipine (NORVASC) tablet 10 mg  10 mg Oral Ivery Quale, MD      . atorvastatin (LIPITOR) tablet 40 mg  40 mg Oral Ivery Quale, MD      . Derrill Memo ON 11/19/2018] calcitRIOL (ROCALTROL) capsule 1.75 mcg  1.75 mcg Oral Q T,Th,Sa-HD Karmen Bongo, MD      . calcium acetate (PHOSLO) capsule 1,334 mg  1,334 mg Oral TID WC Karmen Bongo, MD      . calcium carbonate (dosed in mg elemental calcium) suspension 500 mg of elemental calcium  500 mg of elemental calcium Oral Q6H PRN Karmen Bongo, MD      . camphor-menthol [facility]) lotion 1 application  1 application Topical F8H PRN Karmen Bongo, MD       And  . hydrOXYzine (ATARAX/VISTARIL) tablet 25 mg  25 mg Oral Q8H PRN Karmen Bongo, MD      . Derrill Memo ON 11/18/2018] Chlorhexidine Gluconate Cloth 2 % PADS 6 each  6 each Topical Q0600 Roney Jaffe, MD      . docusate sodium [facility]) enema 283 mg  1 enema Rectal PRN Karmen Bongo, MD      . Derrill Memo ON 11/18/2018] DULoxetine (CYMBALTA) DR capsule 90 mg  90 mg Oral Daily Karmen Bongo, MD      . feeding supplement  (NEPRO CARB STEADY) liquid 237 mL  237 mL Oral TID PRN Karmen Bongo, MD      . insulin aspart (novoLOG) injection 0-9 Units  0-9 Units Subcutaneous TID WC Karmen Bongo, MD      . insulin detemir (LEVEMIR) injection 12 Units  12 Units Subcutaneous QHS Karmen Bongo, MD      . LORazepam (ATIVAN) tablet 0.5 mg  0.5 mg Oral Q8H PRN Karmen Bongo, MD      . methocarbamol (ROBAXIN) tablet 500 mg  500 mg Oral TID PRN Karmen Bongo, MD      . multivitamin (RENA-VIT) tablet 1 tablet  1 tablet Oral QHS Karmen Bongo, MD      . ondansetron [facility]) tablet 4 mg  4 mg Oral Q6H PRN Karmen Bongo, MD       Or  . ondansetron [facility]) injection 4 mg  4 mg Intravenous Q6H PRN Karmen Bongo, MD      . oxyCODONE (Oxy IR/ROXICODONE) immediate release tablet 10 mg  10 mg Oral Q6H PRN Karmen Bongo, MD      . sorbitol 70 % solution 30 mL  30 mL Oral PRN Karmen Bongo, MD      . traZODone (DESYREL) tablet 50 mg  50 mg Oral Ivery Quale, MD      . zolpidem [facility]) tablet 5 mg  5 mg Oral QHS PRN Karmen Bongo, MD       Labs: Basic Metabolic Panel: Recent Labs  Lab 11/17/18 1016  NA 132*  K 6.1*  CL 87*  CO2 29  GLUCOSE 220*  BUN 82*  CREATININE 9.01*  CALCIUM 8.9   Liver Function Tests: Recent Labs  Lab  11/17/18 1016  AST 38  ALT 30  ALKPHOS 116  BILITOT 0.9  PROT 6.6  ALBUMIN 3.3*   Recent Labs  Lab 11/17/18 1016  LIPASE 58*   No results for input(s): AMMONIA in the last 168 hours. CBC: Recent Labs  Lab 11/17/18 1016  WBC 3.1*  HGB 5.9*  HCT 19.7*  MCV 105.3*  PLT 129*   Cardiac Enzymes: No results for input(s): CKTOTAL, CKMB, CKMBINDEX, TROPONINI in the last 168 hours. CBG: No results for input(s): GLUCAP in the last 168 hours. Iron Studies: No results for input(s): IRON, TIBC, TRANSFERRIN, FERRITIN in the last 72 hours. Studies/Results: No results found.  ROS: As per HPI otherwise negative.   Physical Exam: Vitals:   11/17/18 1142 11/17/18  1200 11/17/18 1230 11/17/18 1309  BP: (!) 142/71 (!) 159/83 (!) 160/85 (!) 142/64  Pulse: 80 88 87 100  Resp: 20 12 (!) 26 20  Temp:    97.6 F (36.4 C)  TempSrc:    Oral  SpO2: 100% 99% 99%   Weight:      Height:         General: Pleasant older female in NAD.  Head: Normocephalic, atraumatic, sclera non-icteric, mucus membranes are moist Neck: Supple. JVD not elevated. Lungs: Clear bilaterally to auscultation without wheezes, rales, or rhonchi. Breathing is unlabored. Heart: RRR with S1 S2. No murmurs, rubs, or gallops appreciated. Abdomen: Soft, non-tender, non-distended with normoactive bowel sounds. No rebound/guarding. No obvious abdominal masses. M-S:  Strength and tone appear normal for age. Lower extremities: BLE edema 1+ LLE trace on RLE. Hyperpigmented lesions on legs- pt pulled drsg off one lesion-now has skin tear medial skin RLE.  Neuro: Alert and oriented X 3. Moves all extremities spontaneously. Psych:  Responds to questions appropriately with a normal affect. Dialysis Access: LFA AVG + bruit  Dialysis Orders: G/O T,Th,S 3.5 hrs 180NRe 400/Autoflow 2.0 73.5 kg 2.0 K/2.0 Ca UFP 4 LFA AVG -No heparin -Mircera 100 mcg IV q 2 weeks (last dose 10/24/2018,Tends to skip HD on Thursdays when labs and ESA are due).  -Venofer 50 mg IV weekly (last dose 10/29/2018 no recent [place]) -Calcitriol 2.0 mcg PO TIW (last PTH 788 Last Phos 4.2 10/13/18)  Assessment/Plan: 1.  LGIB-GI consulted. HGB 5.9. Will receive 2 units PRBCs in HD. Scheduled for upper endoscopy and colonoscopy tomorrow. Hemodynamically stable.  2. Hyperkalemia-K+ 6.1 will have HD today on schedule. Unclear etiology of hyperkalemia. Has similar presentation last month. No issues reported with AVF-had decent AF at OP center. May need longer treatment time.  3.  ESRD - T,Th,S via L AVG. No heparin.  4.  Hypertension/volume  - Wt 74.4 kg. Not overtly volume overloaded. Attempt 1-2 liters with HD today. Amlodipine has  been resumed, resume labetalol 200 mg PO BID.  5.  Anemia  - As noted above. No recent anemia panel-draw with HD today.  6.  Metabolic bone disease - C Ca 9.5. Continue VDRA/calcium acetate binders when eating. Give IV hectorol with HD today.  7.  Nutrition - Clear liquids. Renal/Carb mod diet when eating. Albumin 3.3.  8.  DM- per primary  Rita H. Owens Shark, NP-C 11/17/2018, 2:44 PM  [facility]per [phone]  Pt seen, examined and agree w A/P as above. Pt w/ recurrent GIB, Hb 5.9. Stable clinically and no vol excess. K+ 6.1. Plan HD this evening w/ tranfusion prbc's.  Will follow.  [facility] 11/17/2018, 3:56 PM

## 2018-11-17 NOTE — H&P (Signed)
History and Physical    [name] MEQ:[id number] DOB: August 25, 1965 DOA: 11/17/2018  PCP: Patient, [name] Consultants:  Sharol Given - orthopedics; nephrology Patient coming from:  At [facility] x 1[facility] years, but trying to move into an apartment;  NOK: Nobody  Chief Complaint: GI bleeding  HPI: [name] is a 53 y.o. female with medical history significant of Jehovah's witness "in training"; ESRD on TTS HD; chronic combined CHF; DM; bipolar; and PVD s/p L TMA presenting with GI bleed.  She was previously admitted one month ago with similar circumstances and agreed to transfusion; she did not have scope during that hospitalization and was recommended to have outpatient f/u.   Yesterday about 5pm, she was watching TV and felt the need to poop.  She noticed straight blood.  It continued every 20 minutes.  At first, it was dark red and foul-smelling.  All subsequent stools have been bright red.  She has not had any stools since being here but had one on the way here.  She does have LLQ and left flank pain.  No fevers.  She started coughing while in the ER, and thinks this is because her blood count is so low.  She is SOB moving around and now - "I need some oxygen."  She does have a sick contact at her facility.  She has HD TTS; she went Saturday but was unable to go today.   ED Course:  Likely lower GI bleed, unknown source.  Repeat bleeding x 2 days.  Hgb dropped to 5.9.  Currently refusing blood, but agreed to transfusion last time and so may agree again.  Review of Systems: As per HPI; otherwise review of systems reviewed and negative.   Ambulatory Status:  Ambulates with a walker  Past Medical History:  Diagnosis Date  . Anemia   . Anxiety   . Bipolar disorder, unspecified ([place]) 02/06/2014  . Chronic combined systolic and diastolic CHF (congestive heart failure) (HCC)    EF 40% by echo and 48% by stress test  . Chronic pain syndrome   . Depression   . ESRD on hemodialysis (HCC)    TTS   . GERD (gastroesophageal reflux disease)   . Glaucoma   . Headache    sinus headaches  . Hepatitis C   . High cholesterol   . History of hiatal hernia   . History of vitamin D deficiency 06/20/2015  . Hypertension   . MRSA infection ?2014   "on the back of my head; spread throughout my bloodstream"  . Neuropathy   . Refusal of blood transfusions as patient is Jehovah's Witness   . Schizoaffective disorder, unspecified condition 02/08/2014  . Seizure [facility]) dx'd 2014   "don't know what kind; last one was ~ 04/2014"  . TIA (transient ischemic attack) 2010  . Type II diabetes mellitus ([place])    INSULIN DEPENDENT    Past Surgical History:  Procedure Laterality Date  . ABDOMINAL HYSTERECTOMY  2014  . ABDOMINAL HYSTERECTOMY  2010  . AMPUTATION Left 05/21/2013   Procedure: Left Midfoot AMPUTATION;  Surgeon: Newt Minion, MD;  Location: [facility];  Service: Orthopedics;  Laterality: Left;  Left Midfoot Amputation  . AV FISTULA PLACEMENT Left 05/04/2015   Procedure: ARTERIOVENOUS (AV) GRAFT INSERTION;  Surgeon: Angelia Mould, MD;  Location: [facility];  Service: Vascular;  Laterality: Left;  . COLONOSCOPY WITH PROPOFOL N/A 06/22/2013   Procedure: COLONOSCOPY WITH PROPOFOL;  Surgeon: Jeryl Columbia, MD;  Location: [facility] ENDOSCOPY;  Service: Endoscopy;  Laterality: N/A;  . ESOPHAGOGASTRODUODENOSCOPY N/A 05/11/2015   Procedure: ESOPHAGOGASTRODUODENOSCOPY (EGD);  Surgeon: Laurence Spates, MD;  Location: [facility] ENDOSCOPY;  Service: Endoscopy;  Laterality: N/A;  . EYE SURGERY Bilateral 2010   Lasik  . FLEXIBLE SIGMOIDOSCOPY N/A 01/17/2017   Procedure: FLEXIBLE SIGMOIDOSCOPY w/ hemorrhoid banding possible;  Surgeon: Gatha Mayer, MD;  Location: [facility] ENDOSCOPY;  Service: Endoscopy;  Laterality: N/A;  . FOOT AMPUTATION Left    due diabetic neuropathy, could not feel ulcer on bottom of foot  . REFRACTIVE SURGERY Bilateral 2013  . TONSILLECTOMY  1970's  . TUBAL LIGATION  2000    Social History    Socioeconomic History  . Marital status: Single    Spouse name: Not on file  . Number of children: 3  . Years of education: 7  . Highest education level: 12th grade  Occupational History  . Occupation: disabled  Social Needs  . Financial resource strain: Not on file  . Food insecurity:    Worry: Not on file    Inability: Not on file  . Transportation needs:    Medical: Not on file    Non-medical: Not on file  Tobacco Use  . Smoking status: Current Some Day Smoker    Packs/day: 1.00    Years: 36.00    Pack years: 36.00    Types: Cigarettes  . Smokeless tobacco: Never Used  . Tobacco comment: 5 cigs in one setting  Substance and Sexual Activity  . Alcohol use: Yes    Alcohol/week: 3.0 standard drinks    Types: 3 Standard drinks or equivalent per week    Frequency: Never    Comment: last use about 2 weeks ago  . Drug use: Not Currently    Types: "Crack" cocaine, Cocaine    Comment: last use >2 years ago  . Sexual activity: Not Currently    Birth control/protection: Abstinence  Lifestyle  . Physical activity:    Days per week: Not on file    Minutes per session: Not on file  . Stress: Not on file  Relationships  . Social connections:    Talks on phone: Not on file    Gets together: Not on file    Attends religious service: Not on file    Active member of club or organization: Not on file    Attends meetings of clubs or organizations: Not on file    Relationship status: Not on file  . Intimate partner violence:    Fear of current or ex partner: Not on file    Emotionally abused: Not on file    Physically abused: Not on file    Forced sexual activity: Not on file  Other Topics Concern  . Not on file  Social History Narrative   ** Merged History Encounter **   Single, lives with son in a one story home. Unable to exercise. Avoids caffeine. 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        Allergies  Allergen Reactions  .  Penicillins Hives and Other (See Comments)    Has patient had a PCN reaction causing immediate rash, facial/tongue/throat swelling, SOB or lightheadedness with hypotension: Yes Has patient had a PCN reaction causing severe rash involving mucus membranes or skin necrosis: No Has patient had a PCN reaction that required hospitalization No Has patient had a PCN reaction occurring within the last 10 years: No If all of the above answers are "  NO", then may proceed with Cephalosporin use. Pt tolerates cefepime and ceftriaxone  . Gabapentin Itching and Swelling  . Lyrica [Pregabalin] Swelling and Other (See Comments)    Facial and leg swelling  . Saphris [Asenapine] Swelling and Other (See Comments)    Facial swelling  . Tramadol Swelling and Other (See Comments)    Leg swelling  . Adhesive [Tape] Itching    SEVERE ITCHING  . Latex Itching    SEVERE ITCHING  . Other Other (See Comments)    NO BLOOD PRODUCTS; PATIENT IS A JEHOVAH'S WITNESS    Family History  Problem Relation Age of Onset  . Hypertension Mother   . Diabetes Mother   . Hypertension Father   . Diabetes Father     Prior to Admission medications   Medication Sig Start Date End Date Taking? Authorizing Provider  albuterol (PROVENTIL HFA;VENTOLIN HFA) 108 (90 Base) MCG/ACT inhaler Inhale 2 puffs into the lungs every 4 (four) hours as needed for wheezing or shortness of breath. 10/15/16   Horton, Barbette Hair, MD  albuterol (PROVENTIL) (2.5 MG/3ML) 0.083% nebulizer solution Take 2.5 mg by nebulization every 6 (six) hours as needed for wheezing or shortness of breath.    [provider]  allopurinol (ZYLOPRIM) 100 MG tablet Take 1 tablet (100 mg total) by mouth every Tuesday, Thursday, and Saturday at 6 PM. 01/25/17   Nita Sells, MD  amLODipine (NORVASC) 10 MG tablet Take 1 tablet (10 mg total) by mouth at bedtime. 09/02/16   Hildred Priest, MD  atorvastatin (LIPITOR) 40 MG tablet Take 1 tablet (40 mg  total) by mouth at bedtime. 09/02/16   Hildred Priest, MD  benzocaine (ORAJEL) 10 % mucosal gel Use as directed 1 application in the mouth or throat 2 (two) times daily as needed for mouth pain.    [provider]  bisacodyl (DULCOLAX) 10 MG suppository Place 10 mg rectally daily as needed (for constipation).    [provider]  calcitRIOL (ROCALTROL) 0.25 MCG capsule Take 7 capsules (1.75 mcg total) by mouth every dialysis (3 times per week on TTS). 10/10/18   Ghimire, Henreitta Leber, MD  calcium acetate (PHOSLO) 667 MG capsule Take 2 capsules (1,334 mg total) by mouth 3 (three) times daily with meals. 09/02/16   Hildred Priest, MD  camphor-menthol [facility]) lotion Apply 1 application topically as needed for itching.    [provider]  diphenhydrAMINE (BENADRYL) 25 mg capsule Take 25 mg by mouth every 6 (six) hours as needed for itching.     [provider]  docusate sodium (COLACE) 100 MG capsule Take 100 mg by mouth 2 (two) times daily.    [provider]  DULoxetine (CYMBALTA) 30 MG capsule Take 90 mg by mouth daily. Take with 60mg  to equal 90mg  once daily    [provider]  DULoxetine (CYMBALTA) 60 MG capsule Take 90 mg by mouth daily. Take with 30mg  to equal 90mg  once daily     [provider]  epoetin alfa (EPOGEN,PROCRIT) 43154 UNIT/ML injection Inject 1 mL (20,000 Units total) into the vein Every Tuesday,Thursday,and Saturday with dialysis. 10/10/18   Ghimire, Henreitta Leber, MD  Ferrous Sulfate (SLOW FE) 142 (45 Fe) MG TBCR Take 1 tablet by mouth daily. 07/19/17   Mariel Aloe, MD  folic acid (FOLVITE) 1 MG tablet Take 1 tablet (1 mg total) by mouth daily. 10/10/18   Ghimire, Henreitta Leber, MD  hydrocortisone (ANUSOL-HC) 2.5 % rectal cream Place rectally 2 (two) times daily.  Patient taking differently: Place 1 application rectally 2 (two) times daily as needed for hemorrhoids.  01/24/17   Nita Sells, MD  insulin  aspart (NOVOLOG) 100 UNIT/ML injection 0-9 Units, Subcutaneous, 3 times daily with meals CBG < 70: implement hypoglycemia protocol CBG 70 - 120: 0 units CBG 121 - 150: 1 unit CBG 151 - 200: 2 units CBG 201 - 250: 3 units CBG 251 - 300: 5 units CBG 301 - 350: 7 units CBG 351 - 400: 9 units CBG > 400: call MD 10/10/18   Jonetta Osgood, MD  Insulin Detemir (LEVEMIR FLEXTOUCH) 100 UNIT/ML Pen Inject 12 Units into the skin at bedtime. 10/10/18   Ghimire, Henreitta Leber, MD  LORazepam (ATIVAN) 0.5 MG tablet Take 1 tablet (0.5 mg total) by mouth every 8 (eight) hours as needed for anxiety. 10/10/18   Ghimire, Henreitta Leber, MD  methocarbamol (ROBAXIN) 500 MG tablet Take 500 mg by mouth 3 (three) times daily as needed for muscle spasms.    [provider]  multivitamin (RENA-VIT) TABS tablet Take 1 tablet by mouth at bedtime. 09/02/16   Hildred Priest, MD  mupirocin ointment (BACTROBAN) 2 % Apply 1 application topically 2 (two) times daily. 09/02/18   Suzan Slick, NP  ondansetron (ZOFRAN) 4 MG tablet Take 4 mg by mouth every 8 (eight) hours as needed for nausea or vomiting.    [provider]  Oxycodone HCl 10 MG TABS Take 1 tablet (10 mg total) by mouth every 6 (six) hours as needed (for pain). 10/10/18   Ghimire, Henreitta Leber, MD  pantoprazole (PROTONIX) 40 MG tablet Take 1 tablet (40 mg total) by mouth daily at 6 (six) AM. 07/20/17   Mariel Aloe, MD  saccharomyces boulardii (FLORASTOR) 250 MG capsule Take 250 mg by mouth daily.    [provider]  senna (SENOKOT) 8.6 MG tablet Take 2 tablets by mouth daily.    [provider]  traZODone (DESYREL) 50 MG tablet Take 0.5 tablets (25 mg total) by mouth at bedtime. Patient taking differently: Take 50 mg by mouth at bedtime.  07/19/17   Mariel Aloe, MD    Physical Exam: Vitals:   11/17/18 1635 11/17/18 1700 11/17/18 1730 11/17/18 1739  BP: (P) 135/61 (!) 111/48 (!) 165/78 (!) 165/78  Pulse: (P) 83 85 94 94   Resp:  20 20   Temp:  97.6 F (36.4 C) 97.6 F (36.4 C)   TempSrc:  Oral Oral   SpO2:  98% 98%   Weight:      Height:         . General:  Appears calm and comfortable and is NAD . Eyes:  PERRL, EOMI, normal lids, iris . ENT:  grossly normal hearing, lips & tongue, mmm . Neck:  no LAD, masses or thyromegaly . Cardiovascular:  RRR, no m/r/g. No LE edema.  Marland Kitchen Respiratory:   CTA bilaterally with no wheezes/rales/rhonchi.  Normal respiratory effort. . Abdomen:  soft, NT, ND, NABS . Skin:  no rash or induration seen on limited exam . Musculoskeletal:  grossly normal tone BUE/BLE, good ROM, no bony abnormality, s/p L transmetatarsal amputation . Psychiatric:  grossly normal mood and affect, speech fluent and appropriate, AOx3 . Neurologic:  CN 2-12 grossly intact, moves all extremities in coordinated fashion, sensation intact    Radiological Exams on Admission: No results found.  EKG: Independently reviewed.  NSR with rate 81; prolonged QT 511; nonspecific ST changes with no evidence of acute ischemia  Labs on Admission: I have personally reviewed the available labs and imaging studies at the time of the admission.  Pertinent labs:   Na++ 132 K+ 6.1 Glucose 220 BUN 82/Creatinine 9.01/GFR 5 Albumin 3.3 WBC 3.1 Hgb 5.9 Platelets 129 INR 1.2  Assessment/Plan Principal Problem:   GI bleeding Active Problems:   Seizure disorder (HCC)   Chronic combined systolic (congestive) and diastolic (congestive) heart failure (HCC)   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 ([place])   ESRD on dialysis (HCC)   External hemorrhoids   Acute blood loss anemia   GI bleeding with acute blood loss anemia -Patient with similar admission 1 month ago with rectal bleeding, requiring transfusion -She did not have EGD or colonoscopy at that time and was encouraged to f/u for outpatient evaluation -Presenting with recurrent bleeding since yesterday -Hgb 5.9 -Differential to include bleeding  hemorrhoids (given patient's history of hemorrhoids, diverticular bleeding, and AVM (albought the patient denies massive bleeding and her vitals are not consistent with massive acute blood loss).  -She is afebrile at this time without tachycardia, and no leukocytosis; will not give antibiotics at this time.  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 Jonnie Finner notified  Seizure d/o -She does not appear to be on medications for this issue at this time  HTN -Continue Norvasc  Chronic combined CHF -6/17 Echo with EF 45-50% and grade 2 diastolic dysfunction -Appears to be compensated at this time - likely managed effectively through HD  DM -Will check A1c (5.9, indicating good control) -Continue Levemir -Cover with sensitive-scale SSI  DVT prophylaxis: SCDs Code Status:  Full - confirmed with patient Family Communication: None present Disposition Plan:  Home once clinically improved Consults called: GI; nephrology  Admission status: Admit - It is my clinical opinion that admission to INPATIENT is reasonable and necessary because of the expectation that this patient will require hospital care that crosses at least 2 midnights to treat this condition based on the medical complexity of the problems presented.  Given the aforementioned information, the predictability of an adverse outcome is felt to be significant.    Karmen Bongo MD [facility]   How to  contact the [facility] Attending or Consulting provider [place] or covering provider during after hours [place], for this patient?  1. Check the care team in [facility] and look for a) attending/consulting TRH provider listed and b) the [facility] team listed 2. Log into [email or web address] and use [place]'s universal password to access. If you do not have the password, please contact the hospital operator. 3. Locate the [facility] provider you are looking for under [facility] and page to a number that you can be directly reached. 4. If you still have difficulty reaching the provider, please page the [facility] (Director on Call) for the Hospitalists listed on amion for assistance.   11/17/2018, 6:02 PM

## 2018-11-17 NOTE — Consult Note (Addendum)
Consultation  Referring Provider:  [facility] hospitalist/ Dr. Lorin Mercy Primary Care Physician:  Patient, [name] Primary Gastroenterologist:  None/unassigned  Reason for Consultation:  Recurrent GI bleed  HPI: [name] is a 53 y.o. female end-stage renal disease on dialysis ,admitted earlier today through the emergency room, brought in by EMS with complaints of intestinal bleeding.  Patient says she had onset of bleeding about 5 PM yesterday very black tarry malodorous stool.  After onset of bleeding she had a bowel movement every 20 to 30 minutes by her report through into the early morning hours.  She says eventually she began passing less volume and" lighter "appearing blood.  On further questioning she says this appeared more maroonish "like.menstual  Blood".  Her last bowel movement was about 10 AM this morning while in the ambulance and has not had any urge for bowel movement since.  She complains of feeling weak and a little bit lightheaded with standing, no syncope, no chest pain no shortness of breath or diaphoresis.  She has no complaints of abdominal pain.  She does say that her belly felt bloated and distended yesterday.  She has been hemodynamically stable, with admitting hemoglobin of 5.9 today and creatinine of 9 She denies any aspirin or NSAID use. Patient was admitted 3/2 through 10/10/2018 with similar episode of GI bleeding was seen by [facility] at that time.  Hemoglobin reached a nadir of 4.4 she did receive transfusions.  On discharge from the hospital hemoglobin was 8.4. She had a bleeding scan done which was negative and decision was made not to pursue endoscopic evaluation. She has had previous episodes with bleeding and had sigmoidoscopy in 2018 per Dr. Carlean Purl which revealed external hemorrhoids and multiple small and large sigmoid diverticuli.  She had full colonoscopy in November 2014 per Dr. May God which was negative other than internal and external hemorrhoids and had  EGD by Dr. Oletta Lamas in October 2016 which was negative.  Other medical issues include congestive heart failure, EF 40 to 48% adult onset diabetes mellitus, bipolar disorder, peripheral vascular disease and history of substance abuse.    Past Medical History:  Diagnosis Date  . Anemia   . Anxiety   . Bipolar disorder, unspecified ([employer]) 02/06/2014  . Chronic combined systolic and diastolic CHF (congestive heart failure) (HCC)    EF 40% by echo and 48% by stress test  . Chronic pain syndrome   . Depression   . ESRD on hemodialysis (HCC)    TTS  . GERD (gastroesophageal reflux disease)   . Glaucoma   . Headache    sinus headaches  . Hepatitis C   . High cholesterol   . History of hiatal hernia   . History of vitamin D deficiency 06/20/2015  . Hypertension   . MRSA infection ?2014   "on the back of my head; spread throughout my bloodstream"  . Neuropathy   . Refusal of blood transfusions as patient is Jehovah's Witness   . Schizoaffective disorder, unspecified condition 02/08/2014  . Seizure [facility]) dx'd 2014   "don't know what kind; last one was ~ 04/2014"  . TIA (transient ischemic attack) 2010  . Type II diabetes mellitus ([place])    INSULIN DEPENDENT    Past Surgical History:  Procedure Laterality Date  . ABDOMINAL HYSTERECTOMY  2014  . ABDOMINAL HYSTERECTOMY  2010  . AMPUTATION Left 05/21/2013   Procedure: Left Midfoot AMPUTATION;  Surgeon: Newt Minion, MD;  Location: [place];  Service: Orthopedics;  Laterality: Left;  Left Midfoot Amputation  . AV FISTULA PLACEMENT Left 05/04/2015   Procedure: ARTERIOVENOUS (AV) GRAFT INSERTION;  Surgeon: Angelia Mould, MD;  Location: [place];  Service: Vascular;  Laterality: Left;  . COLONOSCOPY WITH PROPOFOL N/A 06/22/2013   Procedure: COLONOSCOPY WITH PROPOFOL;  Surgeon: Jeryl Columbia, MD;  Location: [facility];  Service: Endoscopy;  Laterality: N/A;  . ESOPHAGOGASTRODUODENOSCOPY N/A 05/11/2015   Procedure: ESOPHAGOGASTRODUODENOSCOPY  (EGD);  Surgeon: Laurence Spates, MD;  Location: [facility] ENDOSCOPY;  Service: Endoscopy;  Laterality: N/A;  . EYE SURGERY Bilateral 2010   Lasik  . FLEXIBLE SIGMOIDOSCOPY N/A 01/17/2017   Procedure: FLEXIBLE SIGMOIDOSCOPY w/ hemorrhoid banding possible;  Surgeon: Gatha Mayer, MD;  Location: [facility] ENDOSCOPY;  Service: Endoscopy;  Laterality: N/A;  . FOOT AMPUTATION Left    due diabetic neuropathy, could not feel ulcer on bottom of foot  . REFRACTIVE SURGERY Bilateral 2013  . TONSILLECTOMY  1970's  . TUBAL LIGATION  2000    Prior to Admission medications   Medication Sig Start Date End Date Taking? Authorizing Provider  albuterol (PROVENTIL HFA;VENTOLIN HFA) 108 (90 Base) MCG/ACT inhaler Inhale 2 puffs into the lungs every 4 (four) hours as needed for wheezing or shortness of breath. 10/15/16  Yes Horton, Barbette Hair, MD  allopurinol (ZYLOPRIM) 100 MG tablet Take 1 tablet (100 mg total) by mouth every Tuesday, Thursday, and Saturday at 6 PM. 01/25/17  Yes Nita Sells, MD  ALPRAZolam Duanne Moron) 0.5 MG tablet Take 0.5 mg by mouth every 8 (eight) hours as needed for anxiety.   Yes [provider]  amLODipine (NORVASC) 10 MG tablet Take 1 tablet (10 mg total) by mouth at bedtime. 09/02/16  Yes Hildred Priest, MD  atorvastatin (LIPITOR) 40 MG tablet Take 1 tablet (40 mg total) by mouth at bedtime. 09/02/16  Yes Hildred Priest, MD  bisacodyl (DULCOLAX) 10 MG suppository Place 10 mg rectally daily as needed (for constipation).   Yes [provider]  calcium acetate (PHOSLO) 667 MG capsule Take 2 capsules (1,334 mg total) by mouth 3 (three) times daily with meals. 09/02/16  Yes Hildred Priest, MD  diphenhydrAMINE (BENADRYL) 25 mg capsule Take 25 mg by mouth every 6 (six) hours as needed for itching.    Yes [provider]  docusate sodium (COLACE) 100 MG capsule Take 100 mg by mouth 2 (two) times daily.   Yes [provider]  DULoxetine  (CYMBALTA) 30 MG capsule Take 90 mg by mouth daily. Take with 60mg  to equal 90mg  once daily   Yes [provider]  DULoxetine (CYMBALTA) 60 MG capsule Take 60 mg by mouth daily. Take one capsule with 30mg  to equal 90mg .   Yes [provider]  epoetin alfa (EPOGEN,PROCRIT) 19622 UNIT/ML injection Inject 1 mL (20,000 Units total) into the vein Every Tuesday,Thursday,and Saturday with dialysis. 10/10/18  Yes Ghimire, Henreitta Leber, MD  Ferrous Sulfate (SLOW FE) 142 (45 Fe) MG TBCR Take 1 tablet by mouth daily. 07/19/17  Yes Mariel Aloe, MD  folic acid (FOLVITE) 1 MG tablet Take 1 tablet (1 mg total) by mouth daily. 10/10/18  Yes Ghimire, Henreitta Leber, MD  hydrocortisone (ANUSOL-HC) 2.5 % rectal cream Place rectally 2 (two) times daily. Patient taking differently: Place 1 application rectally 2 (two) times daily as needed for hemorrhoids.  01/24/17  Yes Nita Sells, MD  insulin aspart (NOVOLOG) 100 UNIT/ML injection 0-9 Units, Subcutaneous, 3 times daily with meals CBG < 70: implement hypoglycemia protocol CBG  70 - 120: 0 units CBG 121 - 150: 1 unit CBG 151 - 200: 2 units CBG 201 - 250: 3 units CBG 251 - 300: 5 units CBG 301 - 350: 7 units CBG 351 - 400: 9 units CBG > 400: call MD 10/10/18  Yes Ghimire, Henreitta Leber, MD  Insulin Detemir (LEVEMIR FLEXTOUCH) 100 UNIT/ML Pen Inject 12 Units into the skin at bedtime. 10/10/18  Yes Ghimire, Henreitta Leber, MD  labetalol (NORMODYNE) 100 MG tablet Take 100 mg by mouth 2 (two) times daily.   Yes [provider]  methocarbamol (ROBAXIN) 500 MG tablet Take 500 mg by mouth 3 (three) times daily as needed for muscle spasms.   Yes [provider]  Multiple Vitamins-Minerals (CEROVITE SENIOR PO) Take 1 tablet by mouth at bedtime.   Yes [provider]  ondansetron (ZOFRAN) 4 MG tablet Take 4 mg by mouth every 8 (eight) hours as needed for nausea or vomiting.   Yes [provider]  Oxycodone HCl 10 MG TABS Take 1 tablet  (10 mg total) by mouth every 6 (six) hours as needed (for pain). 10/10/18  Yes Ghimire, Henreitta Leber, MD  pantoprazole (PROTONIX) 40 MG tablet Take 1 tablet (40 mg total) by mouth daily at 6 (six) AM. 07/20/17  Yes Mariel Aloe, MD  saccharomyces boulardii (FLORASTOR) 250 MG capsule Take 250 mg by mouth daily.   Yes [provider]  senna (SENOKOT) 8.6 MG tablet Take 2 tablets by mouth daily.   Yes [provider]  traZODone (DESYREL) 50 MG tablet Take 0.5 tablets (25 mg total) by mouth at bedtime. Patient taking differently: Take 50 mg by mouth at bedtime.  07/19/17  Yes Mariel Aloe, MD  calcitRIOL (ROCALTROL) 0.25 MCG capsule Take 7 capsules (1.75 mcg total) by mouth every dialysis (3 times per week on TTS). 10/10/18   Ghimire, Henreitta Leber, MD  LORazepam (ATIVAN) 0.5 MG tablet Take 1 tablet (0.5 mg total) by mouth every 8 (eight) hours as needed for anxiety. Patient not taking: Reported on 11/17/2018 10/10/18   Jonetta Osgood, MD  multivitamin (RENA-VIT) TABS tablet Take 1 tablet by mouth at bedtime. Patient not taking: Reported on 11/17/2018 09/02/16   Hildred Priest, MD  mupirocin ointment (BACTROBAN) 2 % Apply 1 application topically 2 (two) times daily. Patient not taking: Reported on 11/17/2018 09/02/18   Suzan Slick, NP    Current Facility-Administered Medications  Medication Dose Route Frequency Provider Last Rate Last Dose  . 0.9 %  sodium chloride infusion (Manually program via Guardrails IV Fluids)   Intravenous Once Karmen Bongo, MD      . acetaminophen (TYLENOL) tablet 650 mg  650 mg Oral Q6H PRN Karmen Bongo, MD       Or  . acetaminophen (TYLENOL) suppository 650 mg  650 mg Rectal Q6H PRN Karmen Bongo, MD      . albuterol (PROVENTIL HFA;VENTOLIN HFA) 108 (90 Base) MCG/ACT inhaler 2 puff  2 puff Inhalation Q4H PRN Karmen Bongo, MD      . allopurinol (ZYLOPRIM) tablet 100 mg  100 mg Oral Q Verdie Shire, MD      . amLODipine  (NORVASC) tablet 10 mg  10 mg Oral Ivery Quale, MD      . atorvastatin (LIPITOR) tablet 40 mg  40 mg Oral Ivery Quale, MD      . calcitRIOL (ROCALTROL) capsule 1.75 mcg  1.75 mcg Oral Q dialysis Karmen Bongo, MD      .  calcium acetate (PHOSLO) capsule 1,334 mg  1,334 mg Oral TID WC Karmen Bongo, MD      . calcium carbonate (dosed in mg elemental calcium) suspension 500 mg of elemental calcium  500 mg of elemental calcium Oral Q6H PRN Karmen Bongo, MD      . camphor-menthol [facility]) lotion 1 application  1 application Topical H4R PRN Karmen Bongo, MD       And  . hydrOXYzine (ATARAX/VISTARIL) tablet 25 mg  25 mg Oral Q8H PRN Karmen Bongo, MD      . docusate sodium [employer]) enema 283 mg  1 enema Rectal PRN Karmen Bongo, MD      . Derrill Memo ON 11/18/2018] DULoxetine (CYMBALTA) DR capsule 90 mg  90 mg Oral Daily Karmen Bongo, MD      . Derrill Memo ON 11/18/2018] DULoxetine (CYMBALTA) DR capsule 90 mg  90 mg Oral Daily Karmen Bongo, MD      . feeding supplement (NEPRO CARB STEADY) liquid 237 mL  237 mL Oral TID PRN Karmen Bongo, MD      . insulin aspart (novoLOG) injection 0-9 Units  0-9 Units Subcutaneous TID WC Karmen Bongo, MD      . Insulin Detemir (LEVEMIR) FlexPen 12 Units  12 Units Subcutaneous QHS Karmen Bongo, MD      . LORazepam (ATIVAN) tablet 0.5 mg  0.5 mg Oral Q8H PRN Karmen Bongo, MD      . methocarbamol (ROBAXIN) tablet 500 mg  500 mg Oral TID PRN Karmen Bongo, MD      . multivitamin (RENA-VIT) tablet 1 tablet  1 tablet Oral QHS Karmen Bongo, MD      . ondansetron [facility]) tablet 4 mg  4 mg Oral Q6H PRN Karmen Bongo, MD       Or  . ondansetron [facility]) injection 4 mg  4 mg Intravenous Q6H PRN Karmen Bongo, MD      . Oxycodone HCl TABS 10 mg  10 mg Oral Q6H PRN Karmen Bongo, MD      . sorbitol 70 % solution 30 mL  30 mL Oral PRN Karmen Bongo, MD      . traZODone (DESYREL) tablet 50 mg  50 mg Oral Ivery Quale, MD       . zolpidem [facility]) tablet 5 mg  5 mg Oral QHS PRN Karmen Bongo, MD        Allergies as of 11/17/2018 - Review Complete 11/17/2018  Allergen Reaction Noted  . Penicillins Hives and Other (See Comments) 09/30/2014  . Gabapentin Itching and Swelling 02/09/2013  . Lyrica [pregabalin] Swelling and Other (See Comments) 09/30/2014  . Saphris [asenapine] Swelling and Other (See Comments) 09/30/2014  . Tramadol Swelling and Other (See Comments) 09/30/2014  . Adhesive [tape] Itching 09/09/2017  . Latex Itching 09/09/2017  . Other Other (See Comments) 05/01/2015    Family History  Problem Relation Age of Onset  . Hypertension Mother   . Diabetes Mother   . Hypertension Father   . Diabetes Father     Social History   Socioeconomic History  . Marital status: Single    Spouse name: Not on file  . Number of children: 3  . Years of education: 103  . Highest education level: 12th grade  Occupational History  . Occupation: disabled  Social Needs  . Financial resource strain: Not on file  . Food insecurity:    Worry: Not on file    Inability: Not on file  . Transportation needs:    Medical: Not  on file    Non-medical: Not on file  Tobacco Use  . Smoking status: Current Some Day Smoker    Packs/day: 1.00    Years: 36.00    Pack years: 36.00    Types: Cigarettes  . Smokeless tobacco: Never Used  . Tobacco comment: 5 cigs in one setting  Substance and Sexual Activity  . Alcohol use: Yes    Alcohol/week: 3.0 standard drinks    Types: 3 Standard drinks or equivalent per week    Frequency: Never    Comment: last use about 2 weeks ago  . Drug use: Not Currently    Types: "Crack" cocaine, Cocaine    Comment: last use >2 years ago  . Sexual activity: Not Currently    Birth control/protection: Abstinence  Lifestyle  . Physical activity:    Days per week: Not on file    Minutes per session: Not on file  . Stress: Not on file  Relationships  . Social connections:    Talks on  phone: Not on file    Gets together: Not on file    Attends religious service: Not on file    Active member of club or organization: Not on file    Attends meetings of clubs or organizations: Not on file    Relationship status: Not on file  . Intimate partner violence:    Fear of current or ex partner: Not on file    Emotionally abused: Not on file    Physically abused: Not on file    Forced sexual activity: Not on file  Other Topics Concern  . Not on file  Social History Narrative   ** Merged History Encounter **   Single, lives with son in a one story home. Unable to exercise. Avoids caffeine. 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        Review of Systems: Pertinent positive and negative review of systems were noted in the above HPI section.  All other review of systems was otherwise negative.  Physical Exam: Vital signs in last 24 hours: Temp:  [97.6 F (36.4 C)-98.3 F (36.8 C)] 97.6 F (36.4 C) (04/14 1309) Pulse Rate:  [80-100] 100 (04/14 1309) Resp:  [12-26] 20 (04/14 1309) BP: (132-160)/(64-85) 142/64 (04/14 1309) SpO2:  [99 %-100 %] 99 % (04/14 1230) Weight:  [74.4 kg] 74.4 kg (04/14 0949) Last BM Date: 11/17/18 General:   Alert,  Well-developed, well-nourished, African-American female, pleasant and cooperative in NAD Head:  Normocephalic and atraumatic. Eyes:  Sclera clear, no icterus.   Conjunctiva pale Ears:  Normal auditory acuity. Nose:  No deformity, discharge,  or lesions. Mouth:  No deformity or lesions.   Neck:  Supple; no masses or thyromegaly. Lungs:  Clear throughout to auscultation.   No wheezes, crackles, or rhonchi. Heart:  Regular rate and rhythm; no murmurs, clicks, rubs,  or gallops. Abdomen:  Soft,nontender, nondistended BS active,nonpalp mass or hsm.   Rectal:  Not done Msk:  Symmetrical without gross deformities. . Pulses:  Normal pulses noted. Extremities:  1+ edema le Neurologic:  Alert and   oriented x4;  grossly normal neurologically. Skin:  Intact without significant lesions or rashes.. Psych:  Alert and cooperative. Normal mood and affect.  Intake/Output from previous day: No intake/output data recorded. Intake/Output this shift: No intake/output data recorded.  Lab Results: Recent Labs    11/17/18 1016  WBC 3.1*  HGB 5.9*  HCT 19.7*  PLT 129*   BMET Recent  Labs    11/17/18 1016  NA 132*  K 6.1*  CL 87*  CO2 29  GLUCOSE 220*  BUN 82*  CREATININE 9.01*  CALCIUM 8.9   LFT Recent Labs    11/17/18 1016  PROT 6.6  ALBUMIN 3.3*  AST 38  ALT 30  ALKPHOS 116  BILITOT 0.9   PT/INR Recent Labs    11/17/18 1016  LABPROT 14.7  INR 1.2     IMPRESSION:   #57 53 year old African-American female with end-stage renal disease on dialysis presenting with recurrent acute GI bleed Patient was hospitalized about 6 weeks ago with similar episode of GI bleeding which was felt most likely diverticular in origin. She had bleeding scan which was negative during that admission and did not have endoscopic evaluation. Patient had onset again yesterday with acute hemorrhage with multiple episodes of melena yesterday eventually passing more maroonish looking stool and smaller volume. At the time of this dictation patient has not had any bleeding over the past 4-1/2 hours, and hopefully bleeding has subsided.  Etiology is not clear, may be recurrent diverticular bleeding however will need to rule out other upper versus lower GI sources for bleeding i.e. peptic ulcer disease, AVMs or other occult lesion  #2 acute on chronic anemia secondary to acute GI bleed #3 End-stage renal disease on dialysis #4 history of congestive heart failure with EF 40 to percent #5 adult onset diabetes mellitus #6.  Bipolar disorder #7 history of substance abuse #8 peripheral vascular disease  PLAN: Nephrology is seeing currently, patient normally dialyzes Tuesday Thursday and Saturday.  Clear liquid diet today Nephrology plans to dialyze this evening and give blood transfusions x2 during dialysis. We will plan for EGD and colonoscopy tomorrow  with Dr. Hilarie Fredrickson,  At 1 PM.  Both procedures were discussed in detail with the patient including indications risks and benefits and she is in agreement and in fact very much wants to undergo endoscopic evaluation to determine the source of her bleeding.  Patient is agreeable to blood transfusions, though says she was raised as a Jehovah's Witness.  She needs to get 2 units of packed RBCs today.  If  patient has further active hemorrhage may need stat nuclear medicine bleeding scan this evening. Thank you will follow with you   [name]  11/17/2018, 1:51 PM

## 2018-11-17 NOTE — ED Provider Notes (Addendum)
[facility] Emergency Department Provider Note MRN:  [id number]  Arrival date & time: 11/17/18     Chief Complaint   GI Bleeding   History of Present Illness   [name] is a 53 y.o. year-old female with a history of ESRD, CHF, bipolar disorder, hepatitis C presenting to the ED with chief complaint of GI bleeding.  Patient has been having dark red blood in her stools for the past 2 days.  Explains that this exact thing happened a few weeks ago and she was admitted to the hospital and needed to be transfused blood.  Denies fever, no headache or vision change, no chest pain or shortness of breath, no dizziness or lightheadedness, endorsing mild left upper quadrant abdominal pain, no dysuria.  Was supposed to go to dialysis today but came here instead to be evaluated.  Review of Systems  A complete 10 system review of systems was obtained and all systems are negative except as noted in the HPI and PMH.   Patient's Health History    Past Medical History:  Diagnosis Date  . Anemia   . Anxiety   . Bipolar disorder, unspecified ([place]) 02/06/2014  . Chronic combined systolic and diastolic CHF (congestive heart failure) (HCC)    EF 40% by echo and 48% by stress test  . Chronic pain syndrome   . Depression   . ESRD on hemodialysis (HCC)    TTS  . GERD (gastroesophageal reflux disease)   . Glaucoma   . Headache    sinus headaches  . Hepatitis C   . High cholesterol   . History of hiatal hernia   . History of vitamin D deficiency 06/20/2015  . Hypertension   . MRSA infection ?2014   "on the back of my head; spread throughout my bloodstream"  . Neuropathy   . Refusal of blood transfusions as patient is Jehovah's Witness   . Schizoaffective disorder, unspecified condition 02/08/2014  . Seizure [facility]) dx'd 2014   "don't know what kind; last one was ~ 04/2014"  . TIA (transient ischemic attack) 2010  . Type II diabetes mellitus ([place])    INSULIN DEPENDENT    Past  Surgical History:  Procedure Laterality Date  . ABDOMINAL HYSTERECTOMY  2014  . ABDOMINAL HYSTERECTOMY  2010  . AMPUTATION Left 05/21/2013   Procedure: Left Midfoot AMPUTATION;  Surgeon: Newt Minion, MD;  Location: [place];  Service: Orthopedics;  Laterality: Left;  Left Midfoot Amputation  . AV FISTULA PLACEMENT Left 05/04/2015   Procedure: ARTERIOVENOUS (AV) GRAFT INSERTION;  Surgeon: Angelia Mould, MD;  Location: [place];  Service: Vascular;  Laterality: Left;  . COLONOSCOPY WITH PROPOFOL N/A 06/22/2013   Procedure: COLONOSCOPY WITH PROPOFOL;  Surgeon: Jeryl Columbia, MD;  Location: [facility] ENDOSCOPY;  Service: Endoscopy;  Laterality: N/A;  . ESOPHAGOGASTRODUODENOSCOPY N/A 05/11/2015   Procedure: ESOPHAGOGASTRODUODENOSCOPY (EGD);  Surgeon: Laurence Spates, MD;  Location: [facility] ENDOSCOPY;  Service: Endoscopy;  Laterality: N/A;  . EYE SURGERY Bilateral 2010   Lasik  . FLEXIBLE SIGMOIDOSCOPY N/A 01/17/2017   Procedure: FLEXIBLE SIGMOIDOSCOPY w/ hemorrhoid banding possible;  Surgeon: Gatha Mayer, MD;  Location[facility] ENDOSCOPY;  Service: Endoscopy;  Laterality: N/A;  . FOOT AMPUTATION Left    due diabetic neuropathy, could not feel ulcer on bottom of foot  . REFRACTIVE SURGERY Bilateral 2013  . TONSILLECTOMY  1970's  . TUBAL LIGATION  2000    Family History  Problem Relation Age of Onset  . Hypertension Mother   .  Diabetes Mother   . Hypertension Father   . Diabetes Father     Social History   Socioeconomic History  . Marital status: Single    Spouse name: Not on file  . Number of children: 3  . Years of education: 55  . Highest education level: 12th grade  Occupational History  . Occupation: disabled  Social Needs  . Financial resource strain: Not on file  . Food insecurity:    Worry: Not on file    Inability: Not on file  . Transportation needs:    Medical: Not on file    Non-medical: Not on file  Tobacco Use  . Smoking status: Current Some Day Smoker    Packs/day: 1.00     Years: 36.00    Pack years: 36.00    Types: Cigarettes  . Smokeless tobacco: Never Used  . Tobacco comment: 5 cigs in one setting  Substance and Sexual Activity  . Alcohol use: Yes    Alcohol/week: 3.0 standard drinks    Types: 3 Standard drinks or equivalent per week    Frequency: Never    Comment: last use about 2 weeks ago  . Drug use: Not Currently    Types: "Crack" cocaine, Cocaine    Comment: last use >2 years ago  . Sexual activity: Not Currently    Birth control/protection: Abstinence  Lifestyle  . Physical activity:    Days per week: Not on file    Minutes per session: Not on file  . Stress: Not on file  Relationships  . Social connections:    Talks on phone: Not on file    Gets together: Not on file    Attends religious service: Not on file    Active member of club or organization: Not on file    Attends meetings of clubs or organizations: Not on file    Relationship status: Not on file  . Intimate partner violence:    Fear of current or ex partner: Not on file    Emotionally abused: Not on file    Physically abused: Not on file    Forced sexual activity: Not on file  Other Topics Concern  . Not on file  Social History Narrative   ** Merged History Encounter **   Single, lives with son in a one story home. Unable to exercise. Avoids caffeine. 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         Physical Exam  Vital Signs and Nursing Notes reviewed Vitals:   11/17/18 1230 11/17/18 1309  BP: (!) 160/85 (!) 142/64  Pulse: 87 100  Resp: (!) 26 20  Temp:  97.6 F (36.4 C)  SpO2: 99%     CONSTITUTIONAL: Well-appearing, NAD NEURO:  Alert and oriented x 3, no focal deficits EYES:  eyes equal and reactive ENT/NECK:  no LAD, no JVD CARDIO:  regular rate, well-perfused, normal S1 and S2 PULM:  CTAB no wheezing or rhonchi GI/GU:  normal bowel sounds, non-distended, non-tender MSK/SPINE:  No gross deformities, no edema  SKIN:  no rash, atraumatic PSYCH:  Appropriate speech and behavior  Diagnostic and Interventional Summary    EKG Interpretation  Date/Time:  Tuesday November 17 2018 11:42:06 EDT Ventricular Rate:  81 PR Interval:    QRS Duration: 104 QT Interval:  440 QTC Calculation: 511 R Axis:   107 Text Interpretation:  Sinus rhythm Probable left atrial enlargement Right axis deviation Consider left ventricular hypertrophy Prolonged QT interval Confirmed  by Gerlene Fee [id number]) on 11/17/2018 11:46:06 AM      Labs Reviewed  CBC - Abnormal; Notable for the following components:      Result Value   WBC 3.1 (*)    RBC 1.87 (*)    Hemoglobin 5.9 (*)    HCT 19.7 (*)    MCV 105.3 (*)    MCHC 29.9 (*)    RDW 15.9 (*)    Platelets 129 (*)    All other components within normal limits  COMPREHENSIVE METABOLIC PANEL - Abnormal; Notable for the following components:   Sodium 132 (*)    Potassium 6.1 (*)    Chloride 87 (*)    Glucose, Bld 220 (*)    BUN 82 (*)    Creatinine, Ser 9.01 (*)    Albumin 3.3 (*)    GFR calc non Af Amer 5 (*)    GFR calc Af Amer 5 (*)    Anion gap 16 (*)    All other components within normal limits  LIPASE, BLOOD - Abnormal; Notable for the following components:   Lipase 58 (*)    All other components within normal limits  MRSA PCR SCREENING  PROTIME-INR  HEMOGLOBIN A1C  PREPARE RBC (CROSSMATCH)  TYPE AND SCREEN    No orders to display    Medications  allopurinol (ZYLOPRIM) tablet 100 mg (has no administration in time range)  Oxycodone HCl TABS 10 mg (has no administration in time range)  amLODipine (NORVASC) tablet 10 mg (has no administration in time range)  atorvastatin (LIPITOR) tablet 40 mg (has no administration in time range)  DULoxetine (CYMBALTA) DR capsule 90 mg (has no administration in time range)  DULoxetine (CYMBALTA) DR capsule 90 mg (has no administration in time range)  LORazepam (ATIVAN) tablet 0.5 mg (has no administration in time range)   traZODone (DESYREL) tablet 50 mg (has no administration in time range)  calcitRIOL (ROCALTROL) capsule 1.75 mcg (has no administration in time range)  Insulin Detemir (LEVEMIR) FlexPen 12 Units (has no administration in time range)  calcium acetate (PHOSLO) capsule 1,334 mg (has no administration in time range)  methocarbamol (ROBAXIN) tablet 500 mg (has no administration in time range)  multivitamin (RENA-VIT) tablet 1 tablet (has no administration in time range)  albuterol (PROVENTIL HFA;VENTOLIN HFA) 108 (90 Base) MCG/ACT inhaler 2 puff (has no administration in time range)  0.9 %  sodium chloride infusion (Manually program via Guardrails IV Fluids) (has no administration in time range)  acetaminophen (TYLENOL) tablet 650 mg (has no administration in time range)    Or  acetaminophen (TYLENOL) suppository 650 mg (has no administration in time range)  zolpidem (AMBIEN) tablet 5 mg (has no administration in time range)  sorbitol 70 % solution 30 mL (has no administration in time range)  docusate sodium (ENEMEEZ) enema 283 mg (has no administration in time range)  ondansetron (ZOFRAN) tablet 4 mg (has no administration in time range)    Or  ondansetron (ZOFRAN) injection 4 mg (has no administration in time range)  camphor-menthol (SARNA) lotion 1 application (has no administration in time range)    And  hydrOXYzine (ATARAX/VISTARIL) tablet 25 mg (has no administration in time range)  calcium carbonate (dosed in mg elemental calcium) suspension 500 mg of elemental calcium (has no administration in time range)  feeding supplement (NEPRO CARB STEADY) liquid 237 mL (has no administration in time range)  insulin aspart (novoLOG) injection 0-9 Units (has no administration in time range)  insulin aspart (novoLOG)  injection 5 Units (5 Units Intravenous Given 11/17/18 1144)  dextrose 50 % solution 50 mL (50 mLs Intravenous Given 11/17/18 1143)     Procedures Critical Care Critical Care  Documentation Critical care time provided by me (excluding procedures): 35 minutes  Condition necessitating critical care: acute blood loss anemia in need of blood transfusion  Components of critical care management: reviewing of prior records, laboratory and imaging interpretation, frequent re-examination and reassessment of vital signs, discussion with consulting service    ED Course and Medical Decision Making  I have reviewed the triage vital signs and the nursing notes.  Pertinent labs & imaging results that were available during my care of the patient were reviewed by me and considered in my medical decision making (see below for details).  Seems consistent with recurrence of GI bleed, patient explains that she never received a follow-up phone call for outpatient colonoscopy.  Normal vital signs currently, will obtain labs and reassess.  Abdominal exam is reassuring and there does not appear to be an indication for CT imaging at this time.  Labs reveal hemoglobin of 5.9.  Discussed this finding with patient, who still is refusing blood transfusion.  Luckily patient is hemodynamically stable and asymptomatic currently.  Patient's potassium is elevated at 6.1, provided with insulin and D50, will need dialysis as an inpatient.  Accepted by Dr. Lorin Mercy of hospital service for admission.  Barth Kirks. [place]onia Small, [place] [email or web address]  Final Clinical Impressions(s) / ED Diagnoses     ICD-10-CM   1. Lower GI bleed K92.2   2. Hyperkalemia E87.5     ED Discharge Orders    None         Maudie Flakes, MD 11/17/18 1408    Maudie Flakes, MD 11/17/18 1409

## 2018-11-17 NOTE — Progress Notes (Signed)
Was notified by Nephrologist that pt. will be receiving unit of blood in dialysis this evening.

## 2018-11-17 NOTE — TOC Progression Note (Addendum)
Transition of Care [facility]) - Progression Note    Patient Details  Name: [name] MRN: [id number] Date of Birth: 25-Jun-1966  Transition of Care [facility] CM/SW [place], LCSW Phone Number: 11/17/2018, 2:52 PM  Clinical Narrative:    CSW notified that patient resides at [facility]. Patient reporting that she needs to get a letter from an apartment that she is looking into to filled out. CSW will follow-up.   Percell Locus Nemesio Castrillon LCSW [phone]

## 2018-11-17 NOTE — ED Triage Notes (Signed)
Pt BIB [facility] from home. Pt complaining of blood in stool for a few days. Pt states stools look like coffee grounds and clots. Pt also complaining of some constipation. Pt goes to dialysis T, TH, Sat. Has not been today.

## 2018-11-17 NOTE — H&P (View-Only) (Signed)
Consultation  Referring Provider:  [facility] hospitalist/ Dr. Lorin Mercy Primary Care Physician:  Patient, [name] Primary Gastroenterologist:  None/unassigned  Reason for Consultation:  Recurrent GI bleed  HPI: [name] is a 53 y.o. female end-stage renal disease on dialysis ,admitted earlier today through the emergency room, brought in by EMS with complaints of intestinal bleeding.  Patient says she had onset of bleeding about 5 PM yesterday very black tarry malodorous stool.  After onset of bleeding she had a bowel movement every 20 to 30 minutes by her report through into the early morning hours.  She says eventually she began passing less volume and" lighter "appearing blood.  On further questioning she says this appeared more maroonish "like.menstual  Blood".  Her last bowel movement was about 10 AM this morning while in the ambulance and has not had any urge for bowel movement since.  She complains of feeling weak and a little bit lightheaded with standing, no syncope, no chest pain no shortness of breath or diaphoresis.  She has no complaints of abdominal pain.  She does say that her belly felt bloated and distended yesterday.  She has been hemodynamically stable, with admitting hemoglobin of 5.9 today and creatinine of 9 She denies any aspirin or NSAID use. Patient was admitted 3/2 through 10/10/2018 with similar episode of GI bleeding was seen by [facility] at that time.  Hemoglobin reached a nadir of 4.4 she did receive transfusions.  On discharge from the hospital hemoglobin was 8.4. She had a bleeding scan done which was negative and decision was made not to pursue endoscopic evaluation. She has had previous episodes with bleeding and had sigmoidoscopy in 2018 per Dr. Carlean Purl which revealed external hemorrhoids and multiple small and large sigmoid diverticuli.  She had full colonoscopy in November 2014 per Dr. May God which was negative other than internal and external hemorrhoids and had  EGD by Dr. Oletta Lamas in October 2016 which was negative.  Other medical issues include congestive heart failure, EF 40 to 48% adult onset diabetes mellitus, bipolar disorder, peripheral vascular disease and history of substance abuse.    Past Medical History:  Diagnosis Date  . Anemia   . Anxiety   . Bipolar disorder, unspecified ([place]) 02/06/2014  . Chronic combined systolic and diastolic CHF (congestive heart failure) (HCC)    EF 40% by echo and 48% by stress test  . Chronic pain syndrome   . Depression   . ESRD on hemodialysis (HCC)    TTS  . GERD (gastroesophageal reflux disease)   . Glaucoma   . Headache    sinus headaches  . Hepatitis C   . High cholesterol   . History of hiatal hernia   . History of vitamin D deficiency 06/20/2015  . Hypertension   . MRSA infection ?2014   "on the back of my head; spread throughout my bloodstream"  . Neuropathy   . Refusal of blood transfusions as patient is Jehovah's Witness   . Schizoaffective disorder, unspecified condition 02/08/2014  . Seizure [facility]) dx'd 2014   "don't know what kind; last one was ~ 04/2014"  . TIA (transient ischemic attack) 2010  . Type II diabetes mellitus ([place])    INSULIN DEPENDENT    Past Surgical History:  Procedure Laterality Date  . ABDOMINAL HYSTERECTOMY  2014  . ABDOMINAL HYSTERECTOMY  2010  . AMPUTATION Left 05/21/2013   Procedure: Left Midfoot AMPUTATION;  Surgeon: Newt Minion, MD;  Location: [place];  Service: Orthopedics;  Laterality: Left;  Left Midfoot Amputation  . AV FISTULA PLACEMENT Left 05/04/2015   Procedure: ARTERIOVENOUS (AV) GRAFT INSERTION;  Surgeon: Angelia Mould, MD;  Location: [place];  Service: Vascular;  Laterality: Left;  . COLONOSCOPY WITH PROPOFOL N/A 06/22/2013   Procedure: COLONOSCOPY WITH PROPOFOL;  Surgeon: Jeryl Columbia, MD;  Location: [facility] ENDOSCOPY;  Service: Endoscopy;  Laterality: N/A;  . ESOPHAGOGASTRODUODENOSCOPY N/A 05/11/2015   Procedure: ESOPHAGOGASTRODUODENOSCOPY  (EGD);  Surgeon: Laurence Spates, MD;  Location[facility] ENDOSCOPY;  Service: Endoscopy;  Laterality: N/A;  . EYE SURGERY Bilateral 2010   Lasik  . FLEXIBLE SIGMOIDOSCOPY N/A 01/17/2017   Procedure: FLEXIBLE SIGMOIDOSCOPY w/ hemorrhoid banding possible;  Surgeon: Gatha Mayer, MD;  Location: [facility] ENDOSCOPY;  Service: Endoscopy;  Laterality: N/A;  . FOOT AMPUTATION Left    due diabetic neuropathy, could not feel ulcer on bottom of foot  . REFRACTIVE SURGERY Bilateral 2013  . TONSILLECTOMY  1970's  . TUBAL LIGATION  2000    Prior to Admission medications   Medication Sig Start Date End Date Taking? Authorizing Provider  albuterol (PROVENTIL HFA;VENTOLIN HFA) 108 (90 Base) MCG/ACT inhaler Inhale 2 puffs into the lungs every 4 (four) hours as needed for wheezing or shortness of breath. 10/15/16  Yes Horton, Barbette Hair, MD  allopurinol (ZYLOPRIM) 100 MG tablet Take 1 tablet (100 mg total) by mouth every Tuesday, Thursday, and Saturday at 6 PM. 01/25/17  Yes Nita Sells, MD  ALPRAZolam Duanne Moron) 0.5 MG tablet Take 0.5 mg by mouth every 8 (eight) hours as needed for anxiety.   Yes [provider]  amLODipine (NORVASC) 10 MG tablet Take 1 tablet (10 mg total) by mouth at bedtime. 09/02/16  Yes Hildred Priest, MD  atorvastatin (LIPITOR) 40 MG tablet Take 1 tablet (40 mg total) by mouth at bedtime. 09/02/16  Yes Hildred Priest, MD  bisacodyl (DULCOLAX) 10 MG suppository Place 10 mg rectally daily as needed (for constipation).   Yes [provider]  calcium acetate (PHOSLO) 667 MG capsule Take 2 capsules (1,334 mg total) by mouth 3 (three) times daily with meals. 09/02/16  Yes Hildred Priest, MD  diphenhydrAMINE (BENADRYL) 25 mg capsule Take 25 mg by mouth every 6 (six) hours as needed for itching.    Yes [provider]  docusate sodium (COLACE) 100 MG capsule Take 100 mg by mouth 2 (two) times daily.   Yes [provider]  DULoxetine  (CYMBALTA) 30 MG capsule Take 90 mg by mouth daily. Take with 60mg  to equal 90mg  once daily   Yes [provider]  DULoxetine (CYMBALTA) 60 MG capsule Take 60 mg by mouth daily. Take one capsule with 30mg  to equal 90mg .   Yes [provider]  epoetin alfa (EPOGEN,PROCRIT) 42706 UNIT/ML injection Inject 1 mL (20,000 Units total) into the vein Every Tuesday,Thursday,and Saturday with dialysis. 10/10/18  Yes Ghimire, Henreitta Leber, MD  Ferrous Sulfate (SLOW FE) 142 (45 Fe) MG TBCR Take 1 tablet by mouth daily. 07/19/17  Yes Mariel Aloe, MD  folic acid (FOLVITE) 1 MG tablet Take 1 tablet (1 mg total) by mouth daily. 10/10/18  Yes Ghimire, Henreitta Leber, MD  hydrocortisone (ANUSOL-HC) 2.5 % rectal cream Place rectally 2 (two) times daily. Patient taking differently: Place 1 application rectally 2 (two) times daily as needed for hemorrhoids.  01/24/17  Yes Nita Sells, MD  insulin aspart (NOVOLOG) 100 UNIT/ML injection 0-9 Units, Subcutaneous, 3 times daily with meals CBG < 70: implement hypoglycemia protocol CBG  70 - 120: 0 units CBG 121 - 150: 1 unit CBG 151 - 200: 2 units CBG 201 - 250: 3 units CBG 251 - 300: 5 units CBG 301 - 350: 7 units CBG 351 - 400: 9 units CBG > 400: call MD 10/10/18  Yes Ghimire, Henreitta Leber, MD  Insulin Detemir (LEVEMIR FLEXTOUCH) 100 UNIT/ML Pen Inject 12 Units into the skin at bedtime. 10/10/18  Yes Ghimire, Henreitta Leber, MD  labetalol (NORMODYNE) 100 MG tablet Take 100 mg by mouth 2 (two) times daily.   Yes [provider]  methocarbamol (ROBAXIN) 500 MG tablet Take 500 mg by mouth 3 (three) times daily as needed for muscle spasms.   Yes [provider]  Multiple Vitamins-Minerals (CEROVITE SENIOR PO) Take 1 tablet by mouth at bedtime.   Yes [provider]  ondansetron (ZOFRAN) 4 MG tablet Take 4 mg by mouth every 8 (eight) hours as needed for nausea or vomiting.   Yes [provider]  Oxycodone HCl 10 MG TABS Take 1 tablet  (10 mg total) by mouth every 6 (six) hours as needed (for pain). 10/10/18  Yes Ghimire, Henreitta Leber, MD  pantoprazole (PROTONIX) 40 MG tablet Take 1 tablet (40 mg total) by mouth daily at 6 (six) AM. 07/20/17  Yes Mariel Aloe, MD  saccharomyces boulardii (FLORASTOR) 250 MG capsule Take 250 mg by mouth daily.   Yes [provider]  senna (SENOKOT) 8.6 MG tablet Take 2 tablets by mouth daily.   Yes [provider]  traZODone (DESYREL) 50 MG tablet Take 0.5 tablets (25 mg total) by mouth at bedtime. Patient taking differently: Take 50 mg by mouth at bedtime.  07/19/17  Yes Mariel Aloe, MD  calcitRIOL (ROCALTROL) 0.25 MCG capsule Take 7 capsules (1.75 mcg total) by mouth every dialysis (3 times per week on TTS). 10/10/18   Ghimire, Henreitta Leber, MD  LORazepam (ATIVAN) 0.5 MG tablet Take 1 tablet (0.5 mg total) by mouth every 8 (eight) hours as needed for anxiety. Patient not taking: Reported on 11/17/2018 10/10/18   Jonetta Osgood, MD  multivitamin (RENA-VIT) TABS tablet Take 1 tablet by mouth at bedtime. Patient not taking: Reported on 11/17/2018 09/02/16   Hildred Priest, MD  mupirocin ointment (BACTROBAN) 2 % Apply 1 application topically 2 (two) times daily. Patient not taking: Reported on 11/17/2018 09/02/18   Suzan Slick, NP    Current Facility-Administered Medications  Medication Dose Route Frequency Provider Last Rate Last Dose  . 0.9 %  sodium chloride infusion (Manually program via Guardrails IV Fluids)   Intravenous Once Karmen Bongo, MD      . acetaminophen (TYLENOL) tablet 650 mg  650 mg Oral Q6H PRN Karmen Bongo, MD       Or  . acetaminophen (TYLENOL) suppository 650 mg  650 mg Rectal Q6H PRN Karmen Bongo, MD      . albuterol (PROVENTIL HFA;VENTOLIN HFA) 108 (90 Base) MCG/ACT inhaler 2 puff  2 puff Inhalation Q4H PRN Karmen Bongo, MD      . allopurinol (ZYLOPRIM) tablet 100 mg  100 mg Oral Q Verdie Shire, MD      . amLODipine  (NORVASC) tablet 10 mg  10 mg Oral Ivery Quale, MD      . atorvastatin (LIPITOR) tablet 40 mg  40 mg Oral Ivery Quale, MD      . calcitRIOL (ROCALTROL) capsule 1.75 mcg  1.75 mcg Oral Q dialysis Karmen Bongo, MD      .  calcium acetate (PHOSLO) capsule 1,334 mg  1,334 mg Oral TID WC Karmen Bongo, MD      . calcium carbonate (dosed in mg elemental calcium) suspension 500 mg of elemental calcium  500 mg of elemental calcium Oral Q6H PRN Karmen Bongo, MD      . camphor-menthol [facility]) lotion 1 application  1 application Topical Z6S PRN Karmen Bongo, MD       And  . hydrOXYzine (ATARAX/VISTARIL) tablet 25 mg  25 mg Oral Q8H PRN Karmen Bongo, MD      . docusate sodium [facility]) enema 283 mg  1 enema Rectal PRN Karmen Bongo, MD      . Derrill Memo ON 11/18/2018] DULoxetine (CYMBALTA) DR capsule 90 mg  90 mg Oral Daily Karmen Bongo, MD      . Derrill Memo ON 11/18/2018] DULoxetine (CYMBALTA) DR capsule 90 mg  90 mg Oral Daily Karmen Bongo, MD      . feeding supplement (NEPRO CARB STEADY) liquid 237 mL  237 mL Oral TID PRN Karmen Bongo, MD      . insulin aspart (novoLOG) injection 0-9 Units  0-9 Units Subcutaneous TID WC Karmen Bongo, MD      . Insulin Detemir (LEVEMIR) FlexPen 12 Units  12 Units Subcutaneous QHS Karmen Bongo, MD      . LORazepam (ATIVAN) tablet 0.5 mg  0.5 mg Oral Q8H PRN Karmen Bongo, MD      . methocarbamol (ROBAXIN) tablet 500 mg  500 mg Oral TID PRN Karmen Bongo, MD      . multivitamin (RENA-VIT) tablet 1 tablet  1 tablet Oral QHS Karmen Bongo, MD      . ondansetron [employer]) tablet 4 mg  4 mg Oral Q6H PRN Karmen Bongo, MD       Or  . ondansetron [facility]) injection 4 mg  4 mg Intravenous Q6H PRN Karmen Bongo, MD      . Oxycodone HCl TABS 10 mg  10 mg Oral Q6H PRN Karmen Bongo, MD      . sorbitol 70 % solution 30 mL  30 mL Oral PRN Karmen Bongo, MD      . traZODone (DESYREL) tablet 50 mg  50 mg Oral Ivery Quale, MD       . zolpidem [facility]) tablet 5 mg  5 mg Oral QHS PRN Karmen Bongo, MD        Allergies as of 11/17/2018 - Review Complete 11/17/2018  Allergen Reaction Noted  . Penicillins Hives and Other (See Comments) 09/30/2014  . Gabapentin Itching and Swelling 02/09/2013  . Lyrica [pregabalin] Swelling and Other (See Comments) 09/30/2014  . Saphris [asenapine] Swelling and Other (See Comments) 09/30/2014  . Tramadol Swelling and Other (See Comments) 09/30/2014  . Adhesive [tape] Itching 09/09/2017  . Latex Itching 09/09/2017  . Other Other (See Comments) 05/01/2015    Family History  Problem Relation Age of Onset  . Hypertension Mother   . Diabetes Mother   . Hypertension Father   . Diabetes Father     Social History   Socioeconomic History  . Marital status: Single    Spouse name: Not on file  . Number of children: 3  . Years of education: 29  . Highest education level: 12th grade  Occupational History  . Occupation: disabled  Social Needs  . Financial resource strain: Not on file  . Food insecurity:    Worry: Not on file    Inability: Not on file  . Transportation needs:    Medical: Not  on file    Non-medical: Not on file  Tobacco Use  . Smoking status: Current Some Day Smoker    Packs/day: 1.00    Years: 36.00    Pack years: 36.00    Types: Cigarettes  . Smokeless tobacco: Never Used  . Tobacco comment: 5 cigs in one setting  Substance and Sexual Activity  . Alcohol use: Yes    Alcohol/week: 3.0 standard drinks    Types: 3 Standard drinks or equivalent per week    Frequency: Never    Comment: last use about 2 weeks ago  . Drug use: Not Currently    Types: "Crack" cocaine, Cocaine    Comment: last use >2 years ago  . Sexual activity: Not Currently    Birth control/protection: Abstinence  Lifestyle  . Physical activity:    Days per week: Not on file    Minutes per session: Not on file  . Stress: Not on file  Relationships  . Social connections:    Talks on  phone: Not on file    Gets together: Not on file    Attends religious service: Not on file    Active member of club or organization: Not on file    Attends meetings of clubs or organizations: Not on file    Relationship status: Not on file  . Intimate partner violence:    Fear of current or ex partner: Not on file    Emotionally abused: Not on file    Physically abused: Not on file    Forced sexual activity: Not on file  Other Topics Concern  . Not on file  Social History Narrative   ** Merged History Encounter **   Single, lives with son in a one story home. Unable to exercise. Avoids caffeine. 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        Review of Systems: Pertinent positive and negative review of systems were noted in the above HPI section.  All other review of systems was otherwise negative.  Physical Exam: Vital signs in last 24 hours: Temp:  [97.6 F (36.4 C)-98.3 F (36.8 C)] 97.6 F (36.4 C) (04/14 1309) Pulse Rate:  [80-100] 100 (04/14 1309) Resp:  [12-26] 20 (04/14 1309) BP: (132-160)/(64-85) 142/64 (04/14 1309) SpO2:  [99 %-100 %] 99 % (04/14 1230) Weight:  [74.4 kg] 74.4 kg (04/14 0949) Last BM Date: 11/17/18 General:   Alert,  Well-developed, well-nourished, African-American female, pleasant and cooperative in NAD Head:  Normocephalic and atraumatic. Eyes:  Sclera clear, no icterus.   Conjunctiva pale Ears:  Normal auditory acuity. Nose:  No deformity, discharge,  or lesions. Mouth:  No deformity or lesions.   Neck:  Supple; no masses or thyromegaly. Lungs:  Clear throughout to auscultation.   No wheezes, crackles, or rhonchi. Heart:  Regular rate and rhythm; no murmurs, clicks, rubs,  or gallops. Abdomen:  Soft,nontender, nondistended BS active,nonpalp mass or hsm.   Rectal:  Not done Msk:  Symmetrical without gross deformities. . Pulses:  Normal pulses noted. Extremities:  1+ edema le Neurologic:  Alert and   oriented x4;  grossly normal neurologically. Skin:  Intact without significant lesions or rashes.. Psych:  Alert and cooperative. Normal mood and affect.  Intake/Output from previous day: No intake/output data recorded. Intake/Output this shift: No intake/output data recorded.  Lab Results: Recent Labs    11/17/18 1016  WBC 3.1*  HGB 5.9*  HCT 19.7*  PLT 129*   BMET Recent  Labs    11/17/18 1016  NA 132*  K 6.1*  CL 87*  CO2 29  GLUCOSE 220*  BUN 82*  CREATININE 9.01*  CALCIUM 8.9   LFT Recent Labs    11/17/18 1016  PROT 6.6  ALBUMIN 3.3*  AST 38  ALT 30  ALKPHOS 116  BILITOT 0.9   PT/INR Recent Labs    11/17/18 1016  LABPROT 14.7  INR 1.2     IMPRESSION:   #35 53 year old African-American female with end-stage renal disease on dialysis presenting with recurrent acute GI bleed Patient was hospitalized about 6 weeks ago with similar episode of GI bleeding which was felt most likely diverticular in origin. She had bleeding scan which was negative during that admission and did not have endoscopic evaluation. Patient had onset again yesterday with acute hemorrhage with multiple episodes of melena yesterday eventually passing more maroonish looking stool and smaller volume. At the time of this dictation patient has not had any bleeding over the past 4-1/2 hours, and hopefully bleeding has subsided.  Etiology is not clear, may be recurrent diverticular bleeding however will need to rule out other upper versus lower GI sources for bleeding i.e. peptic ulcer disease, AVMs or other occult lesion  #2 acute on chronic anemia secondary to acute GI bleed #3 End-stage renal disease on dialysis #4 history of congestive heart failure with EF 40 to percent #5 adult onset diabetes mellitus #6.  Bipolar disorder #7 history of substance abuse #8 peripheral vascular disease  PLAN: Nephrology is seeing currently, patient normally dialyzes Tuesday Thursday and Saturday.  Clear liquid diet today Nephrology plans to dialyze this evening and give blood transfusions x2 during dialysis. We will plan for EGD and colonoscopy tomorrow  with Dr. Hilarie Fredrickson,  At 1 PM.  Both procedures were discussed in detail with the patient including indications risks and benefits and she is in agreement and in fact very much wants to undergo endoscopic evaluation to determine the source of her bleeding.  Patient is agreeable to blood transfusions, though says she was raised as a Jehovah's Witness.  She needs to get 2 units of packed RBCs today.  If  patient has further active hemorrhage may need stat nuclear medicine bleeding scan this evening. Thank you will follow with you   Rahil Passey  11/17/2018, 1:51 PM

## 2018-11-17 NOTE — ED Notes (Signed)
This RN spoke with EDP about performing type and screen for this pt. Lab does not want to run test because pt has not consented to blood. EDP spoke with pt about receiving blood. At this time pt has not consented to blood transfusion.

## 2018-11-17 NOTE — ED Notes (Signed)
ED TO INPATIENT HANDOFF REPORT  ED Nurse Name and Phone #: Thurmond Butts, [place] Name/Age/Gender [name] 53 y.o. female Room/Bed: 030C/030C  Code Status   Code Status: Prior  Home/SNF/Other Rehab Patient oriented to: self, place, time and situation Is this baseline? Yes   Triage Complete: Triage complete  Chief Complaint gi bleed  Triage Note Pt BIB [facility] from home. Pt complaining of blood in stool for a few days. Pt states stools look like coffee grounds and clots. Pt also complaining of some constipation. Pt goes to dialysis T, TH, Sat. Has not been today.   Allergies Allergies  Allergen Reactions  . Penicillins Hives and Other (See Comments)    Has patient had a PCN reaction causing immediate rash, facial/tongue/throat swelling, SOB or lightheadedness with hypotension: Yes Has patient had a PCN reaction causing severe rash involving mucus membranes or skin necrosis: No Has patient had a PCN reaction that required hospitalization No Has patient had a PCN reaction occurring within the last 10 years: No If all of the above answers are "NO", then may proceed with Cephalosporin use. Pt tolerates cefepime and ceftriaxone  . Gabapentin Itching and Swelling  . Lyrica [Pregabalin] Swelling and Other (See Comments)    Facial and leg swelling  . Saphris [Asenapine] Swelling and Other (See Comments)    Facial swelling  . Tramadol Swelling and Other (See Comments)    Leg swelling  . Adhesive [Tape] Itching    SEVERE ITCHING  . Latex Itching    SEVERE ITCHING  . Other Other (See Comments)    NO BLOOD PRODUCTS; PATIENT IS A JEHOVAH'S WITNESS    Level of Care/Admitting Diagnosis ED Disposition    ED Disposition Condition Comment   Admit  Hospital Area: [facility] [facility] [[id number]]  Level of Care: Med-Surg [16]  Diagnosis: GI bleeding [[id number]]  Admitting Physician: Karmen Bongo [[id number]]  Attending Physician: Karmen Bongo [[id number]]  Estimated length of stay: past  midnight tomorrow  Certification:: I certify this patient will need inpatient services for at least 2 midnights  Possible Covid Disease Patient Isolation: N/A  PT Class (Do Not Modify): Inpatient [101]  PT Acc Code (Do Not Modify): Private [1]       B Medical/Surgery History Past Medical History:  Diagnosis Date  . Anemia   . Anxiety   . Bipolar disorder, unspecified ([place]) 02/06/2014  . Chronic combined systolic and diastolic CHF (congestive heart failure) (HCC)    EF 40% by echo and 48% by stress test  . Chronic pain syndrome   . Depression   . ESRD on hemodialysis ([place])   . GERD (gastroesophageal reflux disease)   . Glaucoma   . Headache    sinus headaches  . Hepatitis C   . High cholesterol   . History of hiatal hernia   . History of vitamin D deficiency 06/20/2015  . Hypertension   . MRSA infection ?2014   "on the back of my head; spread throughout my bloodstream"  . Neuropathy   . Refusal of blood transfusions as patient is Jehovah's Witness   . Schizoaffective disorder, unspecified condition 02/08/2014  . Seizure [facility]) dx'd 2014   "don't know what kind; last one was ~ 04/2014"  . Stroke [facility])    TIA 2009  . TIA (transient ischemic attack) 2010  . Type II diabetes mellitus ([place])    INSULIN DEPENDENT   Past Surgical History:  Procedure Laterality Date  . ABDOMINAL HYSTERECTOMY  2014  .  ABDOMINAL HYSTERECTOMY  2010  . AMPUTATION Left 05/21/2013   Procedure: Left Midfoot AMPUTATION;  Surgeon: Newt Minion, MD;  Location: [place];  Service: Orthopedics;  Laterality: Left;  Left Midfoot Amputation  . AV FISTULA PLACEMENT Left 05/04/2015   Procedure: ARTERIOVENOUS (AV) GRAFT INSERTION;  Surgeon: Angelia Mould, MD;  Location: [place];  Service: Vascular;  Laterality: Left;  . COLONOSCOPY WITH PROPOFOL N/A 06/22/2013   Procedure: COLONOSCOPY WITH PROPOFOL;  Surgeon: Jeryl Columbia, MD;  Location: [facility] ENDOSCOPY;  Service: Endoscopy;  Laterality: N/A;  .  ESOPHAGOGASTRODUODENOSCOPY N/A 05/11/2015   Procedure: ESOPHAGOGASTRODUODENOSCOPY (EGD);  Surgeon: Laurence Spates, MD;  Location: [facility] ENDOSCOPY;  Service: Endoscopy;  Laterality: N/A;  . EYE SURGERY Bilateral 2010   Lasik  . FLEXIBLE SIGMOIDOSCOPY N/A 01/17/2017   Procedure: FLEXIBLE SIGMOIDOSCOPY w/ hemorrhoid banding possible;  Surgeon: Gatha Mayer, MD;  Location: [facility] ENDOSCOPY;  Service: Endoscopy;  Laterality: N/A;  . FOOT AMPUTATION Left    due diabetic neuropathy, could not feel ulcer on bottom of foot  . REFRACTIVE SURGERY Bilateral 2013  . TONSILLECTOMY  1970's  . TUBAL LIGATION  2000     A IV Location/Drains/Wounds Patient Lines/Drains/Airways Status   Active Line/Drains/Airways    Name:   Placement date:   Placement time:   Site:   Days:   Peripheral IV 11/17/18 Right Antecubital   11/17/18    1007    Antecubital   less than 1   Fistula / Graft Left Forearm Arteriovenous vein graft   05/04/15    0819    Forearm   1293   Wound / Incision (Open or Dehisced) 10/05/18 Non-pressure wound Foot Right;Left several scabbed wounds to right foot between 1st & 2nd toes; on 4th & 5th toes   10/05/18    0500    Foot   43   Wound / Incision (Open or Dehisced) 10/05/18 Incision - Open Foot Anterior;Left;Distal amputation to top of left foot, including toes   10/05/18    0500    Foot   43   Wound / Incision (Open or Dehisced) 10/08/18 Heel Left;Posterior open   10/08/18    2040    Heel   40          Intake/Output Last 24 hours No intake or output data in the 24 hours ending 11/17/18 1206  Labs/Imaging Results for orders placed or performed during the hospital encounter of 11/17/18 (from the past 48 hour(s))  CBC     Status: Abnormal   Collection Time: 11/17/18 10:16 AM  Result Value Ref Range   WBC 3.1 (L) 4.0 - 10.5 K/uL   RBC 1.87 (L) 3.87 - 5.11 MIL/uL   Hemoglobin 5.9 (LL) 12.0 - 15.0 g/dL    Comment: REPEATED TO VERIFY THIS CRITICAL RESULT HAS VERIFIED AND BEEN CALLED TO RN  BARBER NAGUI BY MESSAN HOUEGNIFIO ON 04 14 2020 AT 1050, AND HAS BEEN READ BACK.     HCT 19.7 (L) 36.0 - 46.0 %   MCV 105.3 (H) 80.0 - 100.0 fL   MCH 31.6 26.0 - 34.0 pg   MCHC 29.9 (L) 30.0 - 36.0 g/dL   RDW 15.9 (H) 11.5 - 15.5 %   Platelets 129 (L) 150 - 400 K/uL   nRBC 0.0 0.0 - 0.2 %    Comment: Performed at [facility] [address] [place], [place] [address]  Comprehensive metabolic panel     Status: Abnormal   Collection Time: 11/17/18  10:16 AM  Result Value Ref Range   Sodium 132 (L) 135 - 145 mmol/L   Potassium 6.1 (H) 3.5 - 5.1 mmol/L   Chloride 87 (L) 98 - 111 mmol/L   CO2 29 22 - 32 mmol/L   Glucose, Bld 220 (H) 70 - 99 mg/dL   BUN 82 (H) 6 - 20 mg/dL   Creatinine, Ser 9.01 (H) 0.44 - 1.00 mg/dL   Calcium 8.9 8.9 - 10.3 mg/dL   Total Protein 6.6 6.5 - 8.1 g/dL   Albumin 3.3 (L) 3.5 - 5.0 g/dL   AST 38 15 - 41 U/L   ALT 30 0 - 44 U/L   Alkaline Phosphatase 116 38 - 126 U/L   Total Bilirubin 0.9 0.3 - 1.2 mg/dL   GFR calc non Af Amer 5 (L) >60 mL/min   GFR calc Af Amer 5 (L) >60 mL/min   Anion gap 16 (H) 5 - 15    Comment: Performed at [facility], [place] [address] [place], [place] [address]  Lipase, blood     Status: Abnormal   Collection Time: 11/17/18 10:16 AM  Result Value Ref Range   Lipase 58 (H) 11 - 51 U/L    Comment: Performed at [facility] [address] [place], [place] [address]  Protime-INR     Status: None   Collection Time: 11/17/18 10:16 AM  Result Value Ref Range   Prothrombin Time 14.7 11.4 - 15.2 seconds   INR 1.2 0.8 - 1.2    Comment: (NOTE) INR goal varies based on device and disease states. Performed at [facility], [place] [address], [place], [place] [address]    No results found.  Pending Labs Unresulted Labs (From admission, onward)   None      Vitals/Pain Today's Vitals   11/17/18 0947 11/17/18 0949 11/17/18 1025 11/17/18 1142  BP:    (!) 142/71  Pulse:    80  Resp:    20  Temp:       TempSrc:      SpO2:    100%  Weight:  74.4 kg    Height:  5\' 4"  (1.626 m)    PainSc: 0-No pain  0-No pain     Isolation Precautions No active isolations  Medications Medications  insulin aspart (novoLOG) injection 5 Units (5 Units Intravenous Given 11/17/18 1144)  dextrose 50 % solution 50 mL (50 mLs Intravenous Given 11/17/18 1143)    Mobility walks Low fall risk   Focused Assessments Cardiac Assessment Handoff:    Lab Results  Component Value Date   CKTOTAL 55 02/25/2017   CKMB 2.5 02/13/2008   TROPONINI 0.03 (HH) 10/05/2018   Lab Results  Component Value Date   DDIMER 0.22 07/12/2011   Does the Patient currently have chest pain? No     R Recommendations: See Admitting Provider Note  Report given to:   Additional Notes:

## 2018-11-17 NOTE — TOC Initial Note (Signed)
Transition of Care [facility]) - Initial/Assessment Note    Patient Details  Name: [name] MRN: [id number] Date of Birth: January 28, 1966  [facility]) CM/SW Contact:    Benard Halsted, LCSW Phone Number: 11/17/2018, 3:00 PM  Clinical Narrative:                 CSW spoke with patient regarding discharge plan. Patient reports that she has been trying to get an apartment for a while and it has finally become available but the paperwork is due tomorrow. She states her daughter has the paperwork but the patient needs to take her debit card to and ATM to get a balance print off. CSW explained that staff is unable to take her card to an ATM without her being present. RN asking to see if she is able to wheel patient downstairs to the ATM.     Barriers to Discharge: Continued Medical Work up   Patient Goals and CMS Choice Patient states their goals for this hospitalization and ongoing recovery are:: To get an apartment      Expected Discharge Plan and Services   In-house Referral: Clinical Social Work     Living arrangements for the past 2 months: [place]                          Prior Living Arrangements/Services Living arrangements for the past 2 months: [place] Lives with:: Self Patient language and need for interpreter reviewed:: Yes Do you feel safe going back to the place where you live?: No   Wants to get out of SNF  Need for Family Participation in Patient Care: No (Comment) Care giver support system in place?: Yes (comment)   Criminal Activity/Legal Involvement Pertinent to Current Situation/Hospitalization: No - Comment as needed  Activities of Daily Living      Permission Sought/Granted Permission sought to share information with : Chartered certified accountant granted to share information with : Yes, Verbal Permission Granted              Emotional Assessment Appearance:: Appears older than stated  age Attitude/Demeanor/Rapport: Engaged Affect (typically observed): Appropriate Orientation: : Oriented to Self, Oriented to Place, Oriented to  Time, Oriented to Situation Alcohol / Substance Use: Not Applicable Psych Involvement: No (comment)  Admission diagnosis:  gi bleed Patient Active Problem List   Diagnosis Date Noted  . GI bleeding 11/17/2018  . No blood products   . GI bleed 10/05/2018  . EKG abnormality 10/05/2018  . Localized edema due to fluid overload 09/05/2017  . Lower GI bleed   . Evaluation by psychiatric service required 07/17/2017  . Volume overload 06/03/2017  . Right supracondylar humerus fracture, with nonunion, subsequent encounter 05/01/2017  . Malnutrition of moderate degree 02/28/2017  . Leg pain, left 02/25/2017  . Left leg pain 02/25/2017  . External hemorrhoids   . Diverticulosis of colon without hemorrhage   . Goals of care, counseling/discussion   . Palliative care encounter   . Cellulitis of left leg 01/16/2017  . Upper GI bleed 01/16/2017  . UGIB (upper gastrointestinal bleed)   . Anemia due to chronic kidney disease   . Cellulitis of left thigh 01/01/2017  . ESRD on dialysis ([place]) 01/01/2017  . Cocaine use disorder, severe, dependence ([place]) 08/29/2016  . Hepatitis C 08/29/2016  . Diabetic ulcer of calf ([place]) 08/29/2016  . Diabetic ulcer of toe ([place]) 08/29/2016  . Cerebrovascular accident (  CVA) ([place]) 08/29/2016  . MDD (major depressive disorder) 08/28/2016  . Dyslipidemia associated with type 2 diabetes mellitus ([place]) 11/10/2015  . Hyperkalemia 09/17/2015  . History of vitamin D deficiency 06/20/2015  . Long term prescription opiate use 06/20/2015  . Chronic pain syndrome 06/19/2015  . Anemia of chronic kidney failure 05/01/2015  . transmetatarsal amputation of the left foot 12/13/2014  . Unilateral visual loss 11/22/2014  . 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 ([place]) 11/22/2014  . Essential hypertension 11/09/2014  . Dizziness    . Chronic combined systolic (congestive) and diastolic (congestive) heart failure ([place])   . Rectal bleeding 04/23/2014  . Anemia, iron deficiency 04/13/2014  . Noncompliance 02/02/2014  . Hyponatremia 05/19/2013  . Seizure disorder ([place]) 02/09/2013   PCP:  Patient, [name] Pharmacy:   [facility], [place] [facility] [address] [place] [address] Phone: [phone] Fax: [phone]     Social Determinants of Health (SDOH) Interventions    Readmission Risk Interventions Readmission Risk Prevention Plan 11/17/2018  SW Recovery Care/Counseling Consult Complete  Some recent data might be hidden

## 2018-11-18 ENCOUNTER — Inpatient Hospital Stay (HOSPITAL_COMMUNITY): Payer: Medicare Other | Admitting: Anesthesiology

## 2018-11-18 ENCOUNTER — Encounter (HOSPITAL_COMMUNITY): Payer: Self-pay

## 2018-11-18 ENCOUNTER — Encounter (HOSPITAL_COMMUNITY)
Admission: EM | Disposition: A | Discharge status: Skilled Nursing Facility | Payer: Self-pay | Source: Home / Self Care | Attending: Internal Medicine

## 2018-11-18 DIAGNOSIS — D123 Benign neoplasm of transverse colon: Secondary | ICD-10-CM

## 2018-11-18 DIAGNOSIS — D122 Benign neoplasm of ascending colon: Secondary | ICD-10-CM

## 2018-11-18 DIAGNOSIS — K2990 Gastroduodenitis, unspecified, without bleeding: Secondary | ICD-10-CM

## 2018-11-18 DIAGNOSIS — D124 Benign neoplasm of descending colon: Secondary | ICD-10-CM

## 2018-11-18 DIAGNOSIS — K922 Gastrointestinal hemorrhage, unspecified: Secondary | ICD-10-CM

## 2018-11-18 DIAGNOSIS — K2970 Gastritis, unspecified, without bleeding: Secondary | ICD-10-CM

## 2018-11-18 HISTORY — PX: BIOPSY: SHX5522

## 2018-11-18 HISTORY — PX: ESOPHAGOGASTRODUODENOSCOPY (EGD) WITH PROPOFOL: SHX5813

## 2018-11-18 HISTORY — PX: COLONOSCOPY WITH PROPOFOL: SHX5780

## 2018-11-18 HISTORY — PX: POLYPECTOMY: SHX5525

## 2018-11-18 LAB — BASIC METABOLIC PANEL
Anion gap: 14 (ref 5–15)
BUN: 31 mg/dL — ABNORMAL HIGH (ref 6–20)
CO2: 28 mmol/L (ref 22–32)
Calcium: 8.9 mg/dL (ref 8.9–10.3)
Chloride: 96 mmol/L — ABNORMAL LOW (ref 98–111)
Creatinine, Ser: 4.65 mg/dL — ABNORMAL HIGH (ref 0.44–1.00)
GFR calc Af Amer: 12 mL/min — ABNORMAL LOW (ref >60)
GFR calc non Af Amer: 10 mL/min — ABNORMAL LOW (ref >60)
Glucose, Bld: 181 mg/dL — ABNORMAL HIGH (ref 70–99)
Potassium: 3.8 mmol/L (ref 3.5–5.1)
Sodium: 138 mmol/L (ref 135–145)

## 2018-11-18 LAB — CBC
HCT: 22.5 % — ABNORMAL LOW (ref 36.0–46.0)
HCT: 22.7 % — ABNORMAL LOW (ref 36.0–46.0)
Hemoglobin: 7.3 g/dL — ABNORMAL LOW (ref 12.0–15.0)
Hemoglobin: 7.5 g/dL — ABNORMAL LOW (ref 12.0–15.0)
MCH: 30.9 pg (ref 26.0–34.0)
MCH: 31.5 pg (ref 26.0–34.0)
MCHC: 32.4 g/dL (ref 30.0–36.0)
MCHC: 33 g/dL (ref 30.0–36.0)
MCV: 95.3 fL (ref 80.0–100.0)
MCV: 95.4 fL (ref 80.0–100.0)
Platelets: 109 10*3/uL — ABNORMAL LOW (ref 150–400)
Platelets: 93 10*3/uL — ABNORMAL LOW (ref 150–400)
RBC: 2.36 MIL/uL — ABNORMAL LOW (ref 3.87–5.11)
RBC: 2.38 MIL/uL — ABNORMAL LOW (ref 3.87–5.11)
RDW: 20.2 % — ABNORMAL HIGH (ref 11.5–15.5)
RDW: 20.4 % — ABNORMAL HIGH (ref 11.5–15.5)
WBC: 5 10*3/uL (ref 4.0–10.5)
WBC: 5 10*3/uL (ref 4.0–10.5)
nRBC: 0 % (ref 0.0–0.2)

## 2018-11-18 LAB — GLUCOSE, CAPILLARY
Glucose-Capillary: 202 mg/dL — ABNORMAL HIGH (ref 70–99)
Glucose-Capillary: 198 mg/dL — ABNORMAL HIGH (ref 70–99)
Glucose-Capillary: 235 mg/dL — ABNORMAL HIGH (ref 70–99)
Glucose-Capillary: 368 mg/dL — ABNORMAL HIGH (ref 70–99)

## 2018-11-18 SURGERY — COLONOSCOPY WITH PROPOFOL
Anesthesia: General

## 2018-11-18 MED ORDER — DEXAMETHASONE SODIUM PHOSPHATE 10 MG/ML IJ SOLN
INTRAMUSCULAR | Status: DC | PRN
  Administered 2018-11-18: 5 mg via INTRAVENOUS

## 2018-11-18 MED ORDER — SODIUM CHLORIDE 0.9 % IV SOLN
INTRAVENOUS | Status: DC | PRN
  Administered 2018-11-18: 30 ug/min via INTRAVENOUS

## 2018-11-18 MED ORDER — ONDANSETRON HCL 4 MG/2ML IJ SOLN
INTRAMUSCULAR | Status: DC | PRN
  Administered 2018-11-18: 4 mg via INTRAVENOUS

## 2018-11-18 MED ORDER — CHLORHEXIDINE GLUCONATE CLOTH 2 % EX PADS: 6.0000 | MEDICATED_PAD | Freq: Every day | CUTANEOUS | Status: DC

## 2018-11-18 MED ORDER — SODIUM CHLORIDE 0.9 % IV SOLN
INTRAVENOUS | Status: DC | PRN
  Administered 2018-11-18: 14:00:00 via INTRAVENOUS

## 2018-11-18 MED ORDER — PROPOFOL 10 MG/ML IV BOLUS
INTRAVENOUS | Status: DC | PRN
  Administered 2018-11-18: 150 mg via INTRAVENOUS

## 2018-11-18 MED ORDER — LIDOCAINE 2% (20 MG/ML) 5 ML SYRINGE
INTRAMUSCULAR | Status: DC | PRN
  Administered 2018-11-18: 60 mg via INTRAVENOUS

## 2018-11-18 MED ORDER — SUCCINYLCHOLINE CHLORIDE 200 MG/10ML IV SOSY
PREFILLED_SYRINGE | INTRAVENOUS | Status: DC | PRN
  Administered 2018-11-18: 100 mg via INTRAVENOUS

## 2018-11-18 MED ORDER — PHENYLEPHRINE 40 MCG/ML (10ML) SYRINGE FOR IV PUSH (FOR BLOOD PRESSURE SUPPORT)
PREFILLED_SYRINGE | INTRAVENOUS | Status: DC | PRN
  Administered 2018-11-18: 80 ug via INTRAVENOUS
  Administered 2018-11-18 (×3): 120 ug via INTRAVENOUS

## 2018-11-18 MED ORDER — EPHEDRINE SULFATE-NACL 50-0.9 MG/10ML-% IV SOSY
PREFILLED_SYRINGE | INTRAVENOUS | Status: DC | PRN
  Administered 2018-11-18: 20 mg via INTRAVENOUS
  Administered 2018-11-18 (×3): 10 mg via INTRAVENOUS

## 2018-11-18 MED ORDER — FENTANYL CITRATE (PF) 100 MCG/2ML IJ SOLN
INTRAMUSCULAR | Status: DC | PRN
  Administered 2018-11-18: 100 ug via INTRAVENOUS

## 2018-11-18 MED ORDER — PANTOPRAZOLE SODIUM 40 MG PO TBEC
40.0000 mg | DELAYED_RELEASE_TABLET | Freq: Every day | ORAL | Status: DC
  Administered 2018-11-19 – 2018-11-20 (×2): 40 mg via ORAL
  Filled 2018-11-18 (×2): qty 1

## 2018-11-18 SURGICAL SUPPLY — 25 items

## 2018-11-18 NOTE — Op Note (Signed)
[facility] Patient Name: [name] Procedure Date : 11/18/2018 MRN: [id number] Attending MD: Jerene Bears , MD Date of Birth: 09-Jun-1966 CSN: [id number] Age: 53 Admit Type: Inpatient Procedure:                Upper GI endoscopy Indications:              Hematochezia, Melena, Recent gastrointestinal                            bleeding Providers:                Lajuan Lines. Hilarie Fredrickson, MD, Carlyn Reichert, RN, Elspeth Cho                            Tech., Technician, Ladona Ridgel, Technician,                            Claybon Jabs CRNA, CRNA Referring MD:             [facility] Medicines:                General Anesthesia Complications:            No immediate complications. Estimated Blood Loss:     Estimated blood loss was minimal. Procedure:                Pre-Anesthesia Assessment:                           - Prior to the procedure, a History and Physical                            was performed, and patient medications and                            allergies were reviewed. The patient's tolerance of                            previous anesthesia was also reviewed. The risks                            and benefits of the procedure and the sedation                            options and risks were discussed with the patient.                            All questions were answered, and informed consent                            was obtained. Prior Anticoagulants: The patient has                            taken no previous anticoagulant or antiplatelet                            agents.  ASA Grade Assessment: III - A patient with                            severe systemic disease. After reviewing the risks                            and benefits, the patient was deemed in                            satisfactory condition to undergo the procedure.                           After obtaining informed consent, the endoscope was                            passed  under direct vision. Throughout the                            procedure, the patient's blood pressure, pulse, and                            oxygen saturations were monitored continuously. The                            GIF-H190 ([id number] Olympus gastroscope was                            introduced through the mouth, and advanced to the                            second part of duodenum. The upper GI endoscopy was                            accomplished without difficulty. The patient                            tolerated the procedure well. Scope In: Scope Out: Findings:      The examined esophagus was normal.      The cardia and gastric fundus were normal on retroflexion.      Mild inflammation characterized by erosions and erythema was found in       the gastric antrum. Biopsies were taken with a cold forceps for       histology and Helicobacter pylori testing.      Localized mild inflammation characterized by erythema was found in the       duodenal bulb.      The second portion of the duodenum was normal. Impression:               - Normal esophagus.                           - Mild gastritis with no evidence of bleeding.                            Biopsied.                           -  Mild duodenitis with no evidence of bleeding.                           - Normal second portion of the duodenum. Moderate Sedation:      N/A Recommendation:           - Return patient to hospital ward for ongoing care.                           - Resume previous diet.                           - Continue present medications.                           - Daily PPI is recommended. Will follow-up biopsies                            for H. Pylori and treat if positive.                           - Await pathology results.                           - See the other procedure note for documentation of                            additional recommendations. Procedure Code(s):        --- Professional ---                            [phone], Esophagogastroduodenoscopy, flexible,                            transoral; with biopsy, single or multiple Diagnosis Code(s):        --- Professional ---                           K29.70, Gastritis, unspecified, without bleeding                           K29.80, Duodenitis without bleeding                           K92.1, Melena (includes Hematochezia)                           K92.2, Gastrointestinal hemorrhage, unspecified CPT copyright 2019 American Medical Association. All rights reserved. The codes documented in this report are preliminary and upon coder review may  be revised to meet current compliance requirements. Jerene Bears, MD 11/18/2018 3:31:26 PM This report has been signed electronically. Number of Addenda: 0

## 2018-11-18 NOTE — Consult Note (Deleted)
[facility] Progress Note  Subjective: just got back from EGC/ colon, had gastritis and nonbleeding colon polyps.   Vitals:   11/18/18 1534 11/18/18 1545 11/18/18 1555 11/18/18 1605  BP: (!) 120/58 136/61 (!) 150/66 (!) 157/70  Pulse: 73 (!) 56 77 78  Resp: 16 (!) 21 16 20   Temp:      TempSrc:      SpO2: 100% 100% 100% 100%  Weight:      Height:        Inpatient medications: . sodium chloride   Intravenous Once  . sodium chloride   Intravenous Once  . allopurinol  100 mg Oral Q T,Th,Sat-1800  . amLODipine  10 mg Oral QHS  . atorvastatin  40 mg Oral QHS  . calcitRIOL  2 mcg Oral Q T,Th,Sa-HD  . calcium acetate  1,334 mg Oral TID WC  . Chlorhexidine Gluconate Cloth  6 each Topical Q0600  . [START ON 11/19/2018] Chlorhexidine Gluconate Cloth  6 each Topical Q0600  . DULoxetine  90 mg Oral Daily  . insulin aspart  0-9 Units Subcutaneous TID WC  . insulin detemir  12 Units Subcutaneous QHS  . labetalol  200 mg Oral BID  . multivitamin  1 tablet Oral QHS  . [START ON 11/19/2018] pantoprazole  40 mg Oral Q0600  . traZODone  50 mg Oral QHS    acetaminophen **OR** acetaminophen, albuterol, calcium carbonate (dosed in mg elemental calcium), camphor-menthol **AND** hydrOXYzine, docusate sodium, feeding supplement (NEPRO CARB STEADY), LORazepam, methocarbamol, ondansetron **OR** ondansetron (ZOFRAN) IV, oxyCODONE, sorbitol, zolpidem    Exam: General: Pleasant older female in NAD.  Head: Normocephalic, atraumatic, sclera non-icteric, mucus membranes are moist Neck: Supple. JVD not elevated. Lungs: Clear bilaterally to auscultation without wheezes, rales, or rhonchi. Breathing is unlabored. Heart: RRR with S1 S2. No murmurs, rubs, or gallops appreciated. Abdomen: Soft, non-tender, non-distended with normoactive bowel sounds. No rebound/guarding. No obvious abdominal masses. M-S:  Strength and tone appear normal for age. Lower extremities: BLE edema 1+ LLE trace on RLE.  Hyperpigmented lesions on legs- pt pulled drsg off one lesion-now has skin tear medial skin RLE.  Neuro: Alert and oriented X 3. Moves all extremities spontaneously. Psych:  Responds to questions appropriately with a normal affect. Dialysis Access: LFA AVG + bruit   G/O  TTS  3.5h  73.5kg   2/2 bath P4   LFA/ AVG   No heparin -Mircera 100 mcg IV q 2 weeks (last dose 10/24/2018,Tends to skip HD on Thursdays when labs and ESA are due).  -Venofer 50 mg IV weekly (last dose 10/29/2018 no recent Tsat) -Calcitriol 2.0 mcg PO TIW (last PTH 788 Last Phos 4.2 10/13/18)  Assessment/Plan: 1.  LGIB-GI consulted. Hb 7.5 after 2u prbc yesterday. Had upper/ lower endo today.  Per GI.  2. Hyperkalemia- better.  3.  ESRD - T,Th,S via L AVG. No heparin. HD tomorrow am.  4.  Hypertension/volume  -  At dry wt. Cont Amlodipine and labetalol 200 mg PO BID.  5.  Anemia  Of ckd and ABL from GIB - recheck Hb w HD in am and transfuse as needed  6.  Metabolic bone disease - C Ca 9.5. Continue VDRA/calcium acetate binders when eating. Give IV hectorol with HD today.  7.  Nutrition -  Renal/Carb mod diet when eating. Albumin 3.3.  8.  DM- per primary  [facility] 11/18/2018, 4:34 PM      Iron/TIBC/Ferritin/ %Sat    Component Value  Date/Time   IRON 78 11/17/2018 1750   TIBC 307 11/17/2018 1750   FERRITIN 3,123 (H) 07/16/2017 2315   IRONPCTSAT 25 11/17/2018 1750   Recent Labs  Lab 11/17/18 1016 11/18/18 0213  NA 132* 138  K 6.1* 3.8  CL 87* 96*  CO2 29 28  GLUCOSE 220* 181*  BUN 82* 31*  CREATININE 9.01* 4.65*  CALCIUM 8.9 8.9  ALBUMIN 3.3*  --   INR 1.2  --    Recent Labs  Lab 11/17/18 1016  AST 38  ALT 30  ALKPHOS 116  BILITOT 0.9  PROT 6.6   Recent Labs  Lab 11/18/18 0213  WBC 5.0  HGB 7.3*  HCT 22.5*  PLT 109*       Physical Exam: Vitals:   11/18/18 1534 11/18/18 1545 11/18/18 1555 11/18/18 1605  BP: (!) 120/58 136/61 (!) 150/66 (!) 157/70   Pulse: 73 (!) 56 77 78  Resp: 16 (!) 21 16 20   Temp:      TempSrc:      SpO2: 100% 100% 100% 100%  Weight:      Height:

## 2018-11-18 NOTE — Anesthesia Preprocedure Evaluation (Signed)
Anesthesia Evaluation  Patient identified by MRN, date of birth, ID band Patient awake    Reviewed: Allergy & Precautions, NPO status , Patient's Chart, lab work & pertinent test results  Airway Mallampati: II  TM Distance: >3 FB     Dental  (+) Dental Advisory Given   Pulmonary COPD, Current Smoker,    breath sounds clear to auscultation       Cardiovascular hypertension, Pt. on medications and Pt. on home beta blockers +CHF   Rhythm:Regular Rate:Normal     Neuro/Psych Seizures -,  TIACVA    GI/Hepatic hiatal hernia, GERD  ,(+) Hepatitis -, C  Endo/Other  diabetes, Type 2, Insulin Dependent  Renal/GU ESRF and DialysisRenal disease     Musculoskeletal   Abdominal   Peds  Hematology  (+) anemia , JEHOVAH'S WITNESS (pt consented and received blood this hospitalization in life saving emergency.)  Anesthesia Other Findings   Reproductive/Obstetrics                             Lab Results  Component Value Date   WBC 5.0 11/18/2018   HGB 7.3 (L) 11/18/2018   HCT 22.5 (L) 11/18/2018   MCV 95.3 11/18/2018   PLT 109 (L) 11/18/2018   Lab Results  Component Value Date   CREATININE 4.65 (H) 11/18/2018   BUN 31 (H) 11/18/2018   NA 138 11/18/2018   K 3.8 11/18/2018   CL 96 (L) 11/18/2018   CO2 28 11/18/2018    Anesthesia Physical Anesthesia Plan  ASA: III  Anesthesia Plan: General   Post-op Pain Management:    Induction: Intravenous and Rapid sequence  PONV Risk Score and Plan: 2 and Dexamethasone, Ondansetron and Treatment may vary due to age or medical condition  Airway Management Planned: Oral ETT  Additional Equipment:   Intra-op Plan:   Post-operative Plan: Extubation in OR  Informed Consent: I have reviewed the patients History and Physical, chart, labs and discussed the procedure including the risks, benefits and alternatives for the proposed anesthesia with the patient  or authorized representative who has indicated his/her understanding and acceptance.     Dental advisory given  Plan Discussed with: CRNA  Anesthesia Plan Comments:         Anesthesia Quick Evaluation

## 2018-11-18 NOTE — Progress Notes (Signed)
[facility] Progress Note  Subjective: just got back from EGC/ colon, had gastritis and nonbleeding colon polyps.   Vitals:   11/18/18 1534 11/18/18 1545 11/18/18 1555 11/18/18 1605  BP: (!) 120/58 136/61 (!) 150/66 (!) 157/70  Pulse: 73 (!) 56 77 78  Resp: 16 (!) 21 16 20   Temp:      TempSrc:      SpO2: 100% 100% 100% 100%  Weight:      Height:        Inpatient medications: . sodium chloride   Intravenous Once  . sodium chloride   Intravenous Once  . allopurinol  100 mg Oral Q T,Th,Sat-1800  . amLODipine  10 mg Oral QHS  . atorvastatin  40 mg Oral QHS  . calcitRIOL  2 mcg Oral Q T,Th,Sa-HD  . calcium acetate  1,334 mg Oral TID WC  . Chlorhexidine Gluconate Cloth  6 each Topical Q0600  . [START ON 11/19/2018] Chlorhexidine Gluconate Cloth  6 each Topical Q0600  . DULoxetine  90 mg Oral Daily  . insulin aspart  0-9 Units Subcutaneous TID WC  . insulin detemir  12 Units Subcutaneous QHS  . labetalol  200 mg Oral BID  . multivitamin  1 tablet Oral QHS  . [START ON 11/19/2018] pantoprazole  40 mg Oral Q0600  . traZODone  50 mg Oral QHS    acetaminophen **OR** acetaminophen, albuterol, calcium carbonate (dosed in mg elemental calcium), camphor-menthol **AND** hydrOXYzine, docusate sodium, feeding supplement (NEPRO CARB STEADY), LORazepam, methocarbamol, ondansetron **OR** ondansetron (ZOFRAN) IV, oxyCODONE, sorbitol, zolpidem    Exam: General: Pleasant older female in NAD.  Head: Normocephalic, atraumatic, sclera non-icteric, mucus membranes are moist Neck: Supple. JVD not elevated. Lungs: Clear bilaterally to auscultation without wheezes, rales, or rhonchi. Breathing is unlabored. Heart: RRR with S1 S2. No murmurs, rubs, or gallops appreciated. Abdomen: Soft, non-tender, non-distended with normoactive bowel sounds. No rebound/guarding. No obvious abdominal masses. M-S:  Strength and tone appear normal for age. Lower extremities: BLE edema 1+ LLE trace on RLE.  Hyperpigmented lesions on legs- pt pulled drsg off one lesion-now has skin tear medial skin RLE.  Neuro: Alert and oriented X 3. Moves all extremities spontaneously. Psych:  Responds to questions appropriately with a normal affect. Dialysis Access: LFA AVG + bruit   G/O  TTS  3.5h  73.5kg   2/2 bath P4   LFA/ AVG   No heparin -Mircera 100 mcg IV q 2 weeks (last dose 10/24/2018,Tends to skip HD on Thursdays when labs and ESA are due).  -Venofer 50 mg IV weekly (last dose 10/29/2018 no recent Tsat) -Calcitriol 2.0 mcg PO TIW (last PTH 788 Last Phos 4.2 10/13/18)  Assessment/Plan: 1.  LGIB-GI consulted. Hb 7.5 after 2u prbc yesterday. Had upper/ lower endo today.  Per GI.  2. Hyperkalemia- better.  3.  ESRD - T,Th,S via L AVG. No heparin. HD tomorrow am.  4.  Hypertension/volume  -  At dry wt. Cont Amlodipine and labetalol 200 mg PO BID.  5.  Anemia  Of ckd and ABL from GIB - recheck Hb w HD in am and transfuse as needed  6.  Metabolic bone disease - C Ca 9.5. Continue VDRA/calcium acetate binders when eating. Give IV hectorol with HD today.  7.  Nutrition -  Renal/Carb mod diet when eating. Albumin 3.3.  8.  DM- per primary  [facility] 11/18/2018, 4:34 PM      Iron/TIBC/Ferritin/ %Sat    Component Value  Date/Time   IRON 78 11/17/2018 1750   TIBC 307 11/17/2018 1750   FERRITIN 3,123 (H) 07/16/2017 2315   IRONPCTSAT 25 11/17/2018 1750   Recent Labs  Lab 11/17/18 1016 11/18/18 0213  NA 132* 138  K 6.1* 3.8  CL 87* 96*  CO2 29 28  GLUCOSE 220* 181*  BUN 82* 31*  CREATININE 9.01* 4.65*  CALCIUM 8.9 8.9  ALBUMIN 3.3*  --   INR 1.2  --    Recent Labs  Lab 11/17/18 1016  AST 38  ALT 30  ALKPHOS 116  BILITOT 0.9  PROT 6.6   Recent Labs  Lab 11/18/18 0213  WBC 5.0  HGB 7.3*  HCT 22.5*  PLT 109*       Physical Exam: Vitals:   11/18/18 1534 11/18/18 1545 11/18/18 1555 11/18/18 1605  BP: (!) 120/58 136/61 (!) 150/66 (!) 157/70   Pulse: 73 (!) 56 77 78  Resp: 16 (!) 21 16 20   Temp:      TempSrc:      SpO2: 100% 100% 100% 100%  Weight:      Height:

## 2018-11-18 NOTE — Anesthesia Postprocedure Evaluation (Signed)
Anesthesia Post Note  Patient: [name]  Procedure(s) Performed: COLONOSCOPY WITH PROPOFOL (N/A ) ESOPHAGOGASTRODUODENOSCOPY (EGD) WITH PROPOFOL (N/A ) BIOPSY POLYPECTOMY     Patient location during evaluation: PACU Anesthesia Type: General Level of consciousness: awake and alert Pain management: pain level controlled Vital Signs Assessment: post-procedure vital signs reviewed and stable Respiratory status: spontaneous breathing, nonlabored ventilation, respiratory function stable and patient connected to nasal cannula oxygen Cardiovascular status: blood pressure returned to baseline and stable Postop Assessment: no apparent nausea or vomiting Anesthetic complications: no    Last Vitals:  Vitals:   11/18/18 1555 11/18/18 1605  BP: (!) 150/66 (!) 157/70  Pulse: 77 78  Resp: 16 20  Temp:    SpO2: 100% 100%    Last Pain:  Vitals:   11/18/18 1605  TempSrc:   PainSc: 0-No pain                 Tiajuana Amass

## 2018-11-18 NOTE — Op Note (Signed)
[facility] Patient Name: [name] Procedure Date : 11/18/2018 MRN: [id number] Attending MD: Jerene Bears , MD Date of Birth: [birth year] CSN: [id number] Age: 53 Admit Type: Inpatient Procedure:                Colonoscopy Indications:              Hematochezia, Melena Providers:                Lajuan Lines. Hilarie Fredrickson, MD, Carlyn Reichert, RN, Elspeth Cho                            Tech., Technician, Ladona Ridgel, Technician,                            Claybon Jabs CRNA, CRNA Referring MD:              Medicines:                General Anesthesia Complications:            No immediate complications. Estimated Blood Loss:     Estimated blood loss was minimal. Procedure:                Pre-Anesthesia Assessment:                           - Prior to the procedure, a History and Physical                            was performed, and patient medications and                            allergies were reviewed. The patient's tolerance of                            previous anesthesia was also reviewed. The risks                            and benefits of the procedure and the sedation                            options and risks were discussed with the patient.                            All questions were answered, and informed consent                            was obtained. Prior Anticoagulants: The patient has                            taken no previous anticoagulant or antiplatelet                            agents. ASA Grade Assessment: III - A patient with  severe systemic disease. After reviewing the risks                            and benefits, the patient was deemed in                            satisfactory condition to undergo the procedure.                           After obtaining informed consent, the colonoscope                            was passed under direct vision. Throughout the                            procedure, the patient's blood  pressure, pulse, and                            oxygen saturations were monitored continuously. The                            PCF-H190DL ([id number]) Olympus pediatric colonoscope                            was introduced through the anus and advanced to the                            terminal ileum. The colonoscopy was performed                            without difficulty. The patient tolerated the                            procedure well. The quality of the bowel                            preparation was good. The terminal ileum, ileocecal                            valve, appendiceal orifice, and rectum were                            photographed. Scope In: 2:55:19 PM Scope Out: 3:20:29 PM Scope Withdrawal Time: 0 hours 20 minutes 7 seconds  Total Procedure Duration: 0 hours 25 minutes 10 seconds  Findings:      Hemorrhoids were found on perianal exam.      The terminal ileum appeared normal.      A 4 mm polyp was found in the ascending colon. The polyp was sessile.       The polyp was removed with a cold snare. Resection and retrieval were       complete.      Two sessile polyps were found in the transverse colon. The polyps were 2       to 5 mm in size. These polyps were removed with a  cold snare. Resection       and retrieval were complete.      A 5 mm polyp was found in the descending colon. The polyp was sessile       and vascular appearing (not actively bleeding). The polyp was removed       with a hot snare. Resection and retrieval were complete.      An area of mild melanosis was found in the transverse colon, in the       ascending colon and in the cecum.      A few small-mouthed diverticula were found in the sigmoid colon.      The retroflexed view of the distal rectum and anal verge was normal and       showed no anal or rectal abnormalities. Impression:               - Hemorrhoids found on perianal exam.                           - The examined portion of the ileum  was normal.                           - One 4 mm polyp in the ascending colon, removed                            with a cold snare. Resected and retrieved.                           - Two 2 to 5 mm polyps in the transverse colon,                            removed with a cold snare. Resected and retrieved.                           - One 5 mm polyp in the descending colon, removed                            with a hot snare. Resected and retrieved.                           - Diverticulosis in the sigmoid colon.                           - The distal rectum and anal verge are normal on                            retroflexion view.                           - No evidence of active or recent bleeding or                            definitive bleeding source found during colonoscopy. Moderate Sedation:      N/A Recommendation:           - Return patient to hospital ward for ongoing care.                           -  Resume previous diet.                           - Continue present medications.                           - Await pathology results.                           - Closely monitor for recurrent GI hemorrhage. If                            acutely present proceed to stat CT angiography of                            the abdomen pelvis. If recurrent bleeding which                            cannot be localized by CT angiography, then would                            consider video capsule endoscopy to examine the                            remaining small bowel.                           - Repeat colonoscopy is recommended for                            surveillance. The colonoscopy date will be                            determined after pathology results from today's                            exam become available for review. Procedure Code(s):        --- Professional ---                           [phone], Colonoscopy, flexible; with removal of                            tumor(s),  polyp(s), or other lesion(s) by snare                            technique Diagnosis Code(s):        --- Professional ---                           K64.9, Unspecified hemorrhoids                           K63.5, Polyp of colon                           K92.1,  Melena (includes Hematochezia)                           K57.30, Diverticulosis of large intestine without                            perforation or abscess without bleeding CPT copyright 2019 American Medical Association. All rights reserved. The codes documented in this report are preliminary and upon coder review may  be revised to meet current compliance requirements. Jerene Bears, MD 11/18/2018 3:39:06 PM This report has been signed electronically. Number of Addenda: 0

## 2018-11-18 NOTE — Anesthesia Procedure Notes (Signed)
Procedure Name: Intubation Date/Time: 11/18/2018 2:38 PM Performed by: Moshe Salisbury, CRNA Pre-anesthesia Checklist: Patient identified, Emergency Drugs available, Suction available and Patient being monitored Patient Re-evaluated:Patient Re-evaluated prior to induction Oxygen Delivery Method: Circle System Utilized Preoxygenation: Pre-oxygenation with 100% oxygen Induction Type: IV induction and Rapid sequence Laryngoscope Size: Mac and 3 Grade View: Grade II Tube type: Oral Tube size: 7.0 mm Number of attempts: 1 Airway Equipment and Method: Stylet Placement Confirmation: ETT inserted through vocal cords under direct vision,  positive ETCO2 and breath sounds checked- equal and bilateral Secured at: 20 cm Tube secured with: Tape Dental Injury: Teeth and Oropharynx as per pre-operative assessment

## 2018-11-18 NOTE — Interval H&P Note (Signed)
History and Physical Interval Note: For EGD/colon this afternoon to eval GI bleeding. CBC Latest Ref Rng & Units 11/18/2018 11/17/2018 10/10/2018  WBC 4.0 - 10.5 K/uL 5.0 3.1(L) 6.6  Hemoglobin 12.0 - 15.0 g/dL 7.3(L) 5.9(LL) 8.4(L)  Hematocrit 36.0 - 46.0 % 22.5(L) 19.7(L) 25.3(L)  Platelets 150 - 400 K/uL 109(L) 129(L) 145(L)   The nature of the procedure, as well as the risks, benefits, and alternatives were carefully and thoroughly reviewed with the patient. Ample time for discussion and questions allowed. The patient understood, was satisfied, and agreed to proceed.     11/18/2018 12:48 PM  [name]  has presented today for surgery, with the diagnosis of Gi bleed.  The various methods of treatment have been discussed with the patient and family. After consideration of risks, benefits and other options for treatment, the patient has consented to  Procedure(s): COLONOSCOPY WITH PROPOFOL (N/A) ESOPHAGOGASTRODUODENOSCOPY (EGD) WITH PROPOFOL (N/A) as a surgical intervention.  The patient's history has been reviewed, patient examined, no change in status, stable for surgery.  I have reviewed the patient's chart and labs.  Questions were answered to the patient's satisfaction.     Lajuan Lines Naftuli Dalsanto

## 2018-11-18 NOTE — Progress Notes (Signed)
Progress Note    [name]  OEV:[id number] DOB: [birth year]  DOA: 11/17/2018 PCP: Patient, [name]    Brief Narrative:     Medical records reviewed and are as summarized below:  [name] is an 53 y.o. female with medical history significant of Jehovah's witness "in training"; ESRD on TTS HD; chronic combined CHF; DM; bipolar; and PVD s/p L TMA presenting with GI bleed.  She was previously admitted one month ago with similar circumstances and agreed to transfusion; she did not have scope during that hospitalization and was recommended to have outpatient f/u.   Plan is for EGD/colonoscopy today.  Assessment/Plan:   Principal Problem:   GI bleeding Active Problems:   Seizure disorder (HCC)   Chronic combined systolic (congestive) and diastolic (congestive) heart failure (HCC)   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 ([place])   ESRD on dialysis ([employer])   External hemorrhoids   Acute blood loss anemia  GI bleeding with acute blood loss anemia -Patient with similar admission 1 month ago with rectal bleeding, requiring transfusion -She did not have EGD or colonoscopy at that time and was encouraged to f/u for outpatient evaluation -Presenting with recurrent bleeding since yesterday -Hgb 5.9- s/p 2 units with improvement to 7 -Differential to include bleeding hemorrhoids (given patient's history of hemorrhoids, diverticular bleeding, and AVM (albought the patient denies massive bleeding and her vitals are not consistent with massive acute blood loss).  -GI to do EGD/colonoscopy -h/h this PM and in AM  ESRD on HD -Patient on chronic TTS HD -got HD on 4/14  Reported Seizure d/o -She does not appear to be on medications for this issue at this time  HTN -Continue Norvasc  Chronic combined CHF -6/17 Echo with EF 45-50% and grade 2 diastolic dysfunction -Appears to be compensated at this time - likely managed effectively through HD  DM -Continue Levemir  -Cover with sensitive-scale SSI   Family Communication/Anticipated D/C date and plan/Code Status   DVT prophylaxis: scd Code Status: Full Code.  Family Communication:  Disposition Plan: pending GI work up   Psychiatric nurse:    None.   Anti-Infectives:    None  Subjective:   Says she continues to have bleeding overnight  Objective:    Vitals:   11/17/18 2157 11/18/18 0620 11/18/18 0910 11/18/18 0928  BP: (!) 171/78 (!) 148/70 (!) 126/59 133/68  Pulse: 94 91 (!) 123 91  Resp: 18 18    Temp: 97.8 F (36.6 C) 98.7 F (37.1 C) 98.5 F (36.9 C) 99 F (37.2 C)  TempSrc: Oral Oral Oral Oral  SpO2: 100%   98%  Weight:      Height:        Intake/Output Summary (Last 24 hours) at 11/18/2018 1117 Last data filed at 11/17/2018 1930 Gross per 24 hour  Intake 630 ml  Output 1000 ml  Net -370 ml   Filed Weights   11/17/18 0949 11/17/18 1615 11/17/18 1930  Weight: 74.4 kg 73.7 kg 72.5 kg    Exam: In bed, pleasant and cooperative   Data Reviewed:   I have personally reviewed following labs and imaging studies:  Labs: Labs show the following:   Basic Metabolic Panel: Recent Labs  Lab 11/17/18 1016 11/18/18 0213  NA 132* 138  K 6.1* 3.8  CL 87* 96*  CO2 29 28  GLUCOSE 220* 181*  BUN 82* 31*  CREATININE 9.01* 4.65*  CALCIUM 8.9 8.9   GFR Estimated Creatinine  Clearance: 13.8 mL/min (A) (by C-G formula based on SCr of 4.65 mg/dL (H)). Liver Function Tests: Recent Labs  Lab 11/17/18 1016  AST 38  ALT 30  ALKPHOS 116  BILITOT 0.9  PROT 6.6  ALBUMIN 3.3*   Recent Labs  Lab 11/17/18 1016  LIPASE 58*   No results for input(s): AMMONIA in the last 168 hours. Coagulation profile Recent Labs  Lab 11/17/18 1016  INR 1.2    CBC: Recent Labs  Lab 11/17/18 1016 11/18/18 0213  WBC 3.1* 5.0  HGB 5.9* 7.3*  HCT 19.7* 22.5*  MCV 105.3* 95.3  PLT 129* 109*   Cardiac Enzymes: No results for input(s): CKTOTAL, CKMB, CKMBINDEX, TROPONINI  in the last 168 hours. BNP (last 3 results) No results for input(s): PROBNP in the last 8760 hours. CBG: Recent Labs  Lab 11/17/18 1613 11/17/18 2112 11/17/18 2303 11/18/18 0747  GLUCAP 140* 96 151* 202*   D-Dimer: No results for input(s): DDIMER in the last 72 hours. Hgb A1c: Recent Labs    11/17/18 1016  HGBA1C 5.9*   Lipid Profile: No results for input(s): CHOL, HDL, LDLCALC, TRIG, CHOLHDL, LDLDIRECT in the last 72 hours. Thyroid function studies: No results for input(s): TSH, T4TOTAL, T3FREE, THYROIDAB in the last 72 hours.  Invalid input(s): FREET3 Anemia work up: Recent Labs    11/17/18 1750  TIBC 307  IRON 78   Sepsis Labs: Recent Labs  Lab 11/17/18 1016 11/18/18 0213  WBC 3.1* 5.0    Microbiology Recent Results (from the past 240 hour(s))  MRSA PCR Screening     Status: None   Collection Time: 11/17/18  1:49 PM  Result Value Ref Range Status   MRSA by PCR NEGATIVE NEGATIVE Final    Comment:        The GeneXpert MRSA Assay (FDA approved for NASAL specimens only), is one component of a comprehensive MRSA colonization surveillance program. It is not intended to diagnose MRSA infection nor to guide or monitor treatment for MRSA infections. Performed at [facility], [place] [address], [place], [place] [address]     Procedures and diagnostic studies:  No results found.  Medications:   . sodium chloride   Intravenous Once  . sodium chloride   Intravenous Once  . allopurinol  100 mg Oral Q T,Th,Sat-1800  . amLODipine  10 mg Oral QHS  . atorvastatin  40 mg Oral QHS  . calcitRIOL  2 mcg Oral Q T,Th,Sa-HD  . calcium acetate  1,334 mg Oral TID WC  . Chlorhexidine Gluconate Cloth  6 each Topical Q0600  . DULoxetine  90 mg Oral Daily  . insulin aspart  0-9 Units Subcutaneous TID WC  . insulin detemir  12 Units Subcutaneous QHS  . labetalol  200 mg Oral BID  . multivitamin  1 tablet Oral QHS  . traZODone  50 mg Oral QHS   Continuous  Infusions:   LOS: 1 day   Geradine Girt  [facility]   How to contact the [facility] Attending or Consulting provider [place] or covering provider during after hours [place], for this patient?  1. Check the care team in [facility] and look for a) attending/consulting [facility] provider listed and b) the [facility] team listed 2. Log into [email or web address] and use [place]'s universal password to access. If you do not have the password, please contact the hospital operator. 3. Locate the [facility] provider you are looking for under [facility] and page to a number  that you can be directly reached. 4. If you still have difficulty reaching the provider, please page the [facility] (Director on Call) for the Hospitalists listed on amion for assistance.  11/18/2018, 11:17 AM

## 2018-11-18 NOTE — Transfer of Care (Signed)
Immediate Anesthesia Transfer of Care Note  Patient: [name]  Procedure(s) Performed: COLONOSCOPY WITH PROPOFOL (N/A ) ESOPHAGOGASTRODUODENOSCOPY (EGD) WITH PROPOFOL (N/A ) BIOPSY POLYPECTOMY  Patient Location: Endoscopy Unit  Anesthesia Type:General  Level of Consciousness: drowsy and patient cooperative  Airway & Oxygen Therapy: Patient Spontanous Breathing and Patient connected to nasal cannula oxygen  Post-op Assessment: Report given to RN, Post -op Vital signs reviewed and stable and Patient moving all extremities  Post vital signs: Reviewed and stable  Last Vitals:  Vitals Value Taken Time  BP 120/58 11/18/2018  3:34 PM  Temp    Pulse 74 11/18/2018  3:35 PM  Resp 21 11/18/2018  3:35 PM  SpO2 100 % 11/18/2018  3:35 PM  Vitals shown include unvalidated device data.  Last Pain:  Vitals:   11/18/18 1534  TempSrc:   PainSc: 0-No pain      Patients Stated Pain Goal: 0 ([id number])  Complications: No apparent anesthesia complications

## 2018-11-19 ENCOUNTER — Encounter (HOSPITAL_COMMUNITY): Payer: Self-pay | Admitting: Internal Medicine

## 2018-11-19 LAB — BASIC METABOLIC PANEL
Anion gap: 17 — ABNORMAL HIGH (ref 5–15)
BUN: 48 mg/dL — ABNORMAL HIGH (ref 6–20)
CO2: 26 mmol/L (ref 22–32)
Calcium: 9.1 mg/dL (ref 8.9–10.3)
Chloride: 90 mmol/L — ABNORMAL LOW (ref 98–111)
Creatinine, Ser: 6.39 mg/dL — ABNORMAL HIGH (ref 0.44–1.00)
GFR calc Af Amer: 8 mL/min — ABNORMAL LOW (ref >60)
GFR calc non Af Amer: 7 mL/min — ABNORMAL LOW (ref >60)
Glucose, Bld: 252 mg/dL — ABNORMAL HIGH (ref 70–99)
Potassium: 4.5 mmol/L (ref 3.5–5.1)
Sodium: 133 mmol/L — ABNORMAL LOW (ref 135–145)

## 2018-11-19 LAB — RENAL FUNCTION PANEL
Albumin: 3.5 g/dL (ref 3.5–5.0)
Anion gap: 13 (ref 5–15)
BUN: 13 mg/dL (ref 6–20)
CO2: 27 mmol/L (ref 22–32)
Calcium: 8.9 mg/dL (ref 8.9–10.3)
Chloride: 94 mmol/L — ABNORMAL LOW (ref 98–111)
Creatinine, Ser: 2.83 mg/dL — ABNORMAL HIGH (ref 0.44–1.00)
GFR calc Af Amer: 21 mL/min — ABNORMAL LOW (ref >60)
GFR calc non Af Amer: 18 mL/min — ABNORMAL LOW (ref >60)
Glucose, Bld: 234 mg/dL — ABNORMAL HIGH (ref 70–99)
Phosphorus: 2.8 mg/dL (ref 2.5–4.6)
Potassium: 2.8 mmol/L — ABNORMAL LOW (ref 3.5–5.1)
Sodium: 134 mmol/L — ABNORMAL LOW (ref 135–145)

## 2018-11-19 LAB — CBC
HCT: 20.7 % — ABNORMAL LOW (ref 36.0–46.0)
Hemoglobin: 6.6 g/dL — CL (ref 12.0–15.0)
MCH: 30.7 pg (ref 26.0–34.0)
MCHC: 31.9 g/dL (ref 30.0–36.0)
MCV: 96.3 fL (ref 80.0–100.0)
Platelets: 106 10*3/uL — ABNORMAL LOW (ref 150–400)
RBC: 2.15 MIL/uL — ABNORMAL LOW (ref 3.87–5.11)
RDW: 19.6 % — ABNORMAL HIGH (ref 11.5–15.5)
WBC: 4 10*3/uL (ref 4.0–10.5)
nRBC: 0 % (ref 0.0–0.2)

## 2018-11-19 LAB — GLUCOSE, CAPILLARY
Glucose-Capillary: 195 mg/dL — ABNORMAL HIGH (ref 70–99)
Glucose-Capillary: 225 mg/dL — ABNORMAL HIGH (ref 70–99)
Glucose-Capillary: 161 mg/dL — ABNORMAL HIGH (ref 70–99)

## 2018-11-19 LAB — HEMOGLOBIN AND HEMATOCRIT, BLOOD
HCT: 26.4 % — ABNORMAL LOW (ref 36.0–46.0)
Hemoglobin: 8.8 g/dL — ABNORMAL LOW (ref 12.0–15.0)

## 2018-11-19 LAB — PREPARE RBC (CROSSMATCH)

## 2018-11-19 LAB — HEPATITIS B SURFACE ANTIGEN: Hepatitis B Surface Ag: NEGATIVE

## 2018-11-19 MED ORDER — SODIUM CHLORIDE 0.9% IV SOLUTION: Freq: Once | INTRAVENOUS | Status: DC

## 2018-11-19 MED ORDER — OXYCODONE HCL 5 MG PO TABS
ORAL_TABLET | ORAL | Status: AC
  Filled 2018-11-19: qty 2

## 2018-11-19 MED ORDER — HYDROXYZINE HCL 25 MG PO TABS
ORAL_TABLET | ORAL | Status: AC
  Filled 2018-11-19: qty 1

## 2018-11-19 NOTE — Progress Notes (Signed)
Progress Note    [name]  EZM:[id number] DOB: [birth year]  DOA: 11/17/2018 PCP: Patient, [name]    Brief Narrative:     Medical records reviewed and are as summarized below:  [name] is an 53 y.o. female with medical history significant of Jehovah's witness "in training"; ESRD on TTS HD; chronic combined CHF; DM; bipolar; and PVD s/p L TMA presenting with GI bleed.  She was previously admitted one month ago with similar circumstances and agreed to transfusion; she did not have scope during that hospitalization and was recommended to have outpatient f/u.   Plan is for EGD/colonoscopy today.  Assessment/Plan:   Principal Problem:   GI bleeding Active Problems:   Seizure disorder (HCC)   Chronic combined systolic (congestive) and diastolic (congestive) heart failure (HCC)   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 ([place])   ESRD on dialysis ([place])   External hemorrhoids   Acute blood loss anemia   Gastritis and gastroduodenitis   Benign neoplasm of ascending colon   Benign neoplasm of transverse colon   Benign neoplasm of descending colon  GI bleeding with acute blood loss anemia -Patient with similar admission 1 month ago with rectal bleeding, requiring transfusion -She did not have EGD or colonoscopy at that time and was encouraged to f/u for outpatient evaluation -Presenting with recurrent bleeding since yesterday -Hgb 5.9- s/p 2 units with improvement to 7 -sp EGD- gastritis- needs protonix daily -s/p colonoscopy:  Hemorrhoids found on perianal exam.                           - The examined portion of the ileum was normal.                           - One 4 mm polyp in the ascending colon, removed                            with a cold snare. Resected and retrieved.                           - Two 2 to 5 mm polyps in the transverse colon,                            removed with a cold snare. Resected and retrieved.                           - One 5 mm  polyp in the descending colon, removed                            with a hot snare. Resected and retrieved.                           - Diverticulosis in the sigmoid colon. If  bleeding acutely present proceed to stat CT angiography of the abdomen pelvis. If recurrent bleeding which  cannot be localized by CT angiography, then would consider video capsule endoscopy to examine the   remaining small bowel. -h/h < 7 this AM and got 1 prbc with HD and is now at 8.8  ESRD on HD -Patient  on chronic TTS HD -got HD on 4/16  Reported Seizure d/o -She does not appear to be on medications for this issue at this time  HTN -Continue Norvasc  Chronic combined CHF -6/17 Echo with EF 45-50% and grade 2 diastolic dysfunction -Appears to be compensated at this time - likely managed effectively through HD  DM -SSI   Family Communication/Anticipated D/C date and plan/Code Status   DVT prophylaxis: scd Code Status: Full Code.  Family Communication:  Disposition Plan: home in AM if H/H stable   Medical Consultants:    Renal  GI  Subjective:   No further bleeding since colonoscopy/EGD  Objective:    Vitals:   11/19/18 0930 11/19/18 1000 11/19/18 1028 11/19/18 1314  BP: (!) 167/77 (!) 183/87 (!) 164/77 (!) 179/73  Pulse: 81 81 87 90  Resp:   18 16  Temp:   98.3 F (36.8 C) 97.8 F (36.6 C)  TempSrc:   Oral Oral  SpO2:   99% 96%  Weight:   72.8 kg   Height:        Intake/Output Summary (Last 24 hours) at 11/19/2018 1342 Last data filed at 11/19/2018 1028 Gross per 24 hour  Intake 450 ml  Output 2500 ml  Net -2050 ml   Filed Weights   11/17/18 1930 11/19/18 0648 11/19/18 1028  Weight: 72.5 kg 75.3 kg 72.8 kg    Exam: On side of bed NAD +BS, soft, NT   Data Reviewed:   I have personally reviewed following labs and imaging studies:  Labs: Labs show the following:   Basic Metabolic Panel: Recent Labs  Lab 11/17/18 1016 11/18/18 0213 11/19/18 0349  NA  132* 138 133*  K 6.1* 3.8 4.5  CL 87* 96* 90*  CO2 29 28 26   GLUCOSE 220* 181* 252*  BUN 82* 31* 48*  CREATININE 9.01* 4.65* 6.39*  CALCIUM 8.9 8.9 9.1   GFR Estimated Creatinine Clearance: 10.1 mL/min (A) (by C-G formula based on SCr of 6.39 mg/dL (H)). Liver Function Tests: Recent Labs  Lab 11/17/18 1016  AST 38  ALT 30  ALKPHOS 116  BILITOT 0.9  PROT 6.6  ALBUMIN 3.3*   Recent Labs  Lab 11/17/18 1016  LIPASE 58*   No results for input(s): AMMONIA in the last 168 hours. Coagulation profile Recent Labs  Lab 11/17/18 1016  INR 1.2    CBC: Recent Labs  Lab 11/17/18 1016 11/18/18 0213 11/18/18 1630 11/19/18 0349 11/19/18 1246  WBC 3.1* 5.0 5.0 4.0  --   HGB 5.9* 7.3* 7.5* 6.6* 8.8*  HCT 19.7* 22.5* 22.7* 20.7* 26.4*  MCV 105.3* 95.3 95.4 96.3  --   PLT 129* 109* 93* 106*  --    Cardiac Enzymes: No results for input(s): CKTOTAL, CKMB, CKMBINDEX, TROPONINI in the last 168 hours. BNP (last 3 results) No results for input(s): PROBNP in the last 8760 hours. CBG: Recent Labs  Lab 11/18/18 0747 11/18/18 1204 11/18/18 1826 11/18/18 2112 11/19/18 1234  GLUCAP 202* 198* 235* 368* 195*   D-Dimer: No results for input(s): DDIMER in the last 72 hours. Hgb A1c: Recent Labs    11/17/18 1016  HGBA1C 5.9*   Lipid Profile: No results for input(s): CHOL, HDL, LDLCALC, TRIG, CHOLHDL, LDLDIRECT in the last 72 hours. Thyroid function studies: No results for input(s): TSH, T4TOTAL, T3FREE, THYROIDAB in the last 72 hours.  Invalid input(s): FREET3 Anemia work up: Recent Labs    11/17/18 1750  TIBC 307  IRON 78  Sepsis Labs: Recent Labs  Lab 11/17/18 1016 11/18/18 0213 11/18/18 1630 11/19/18 0349  WBC 3.1* 5.0 5.0 4.0    Microbiology Recent Results (from the past 240 hour(s))  MRSA PCR Screening     Status: None   Collection Time: 11/17/18  1:49 PM  Result Value Ref Range Status   MRSA by PCR NEGATIVE NEGATIVE Final    Comment:        The  GeneXpert MRSA Assay (FDA approved for NASAL specimens only), is one component of a comprehensive MRSA colonization surveillance program. It is not intended to diagnose MRSA infection nor to guide or monitor treatment for MRSA infections. Performed at [facility], [place] [address] [place], [place] [address]     Procedures and diagnostic studies:  No results found.  Medications:   . sodium chloride   Intravenous Once  . sodium chloride   Intravenous Once  . sodium chloride   Intravenous Once  . allopurinol  100 mg Oral Q T,Th,Sat-1800  . amLODipine  10 mg Oral QHS  . atorvastatin  40 mg Oral QHS  . calcitRIOL  2 mcg Oral Q T,Th,Sa-HD  . calcium acetate  1,334 mg Oral TID WC  . Chlorhexidine Gluconate Cloth  6 each Topical Q0600  . DULoxetine  90 mg Oral Daily  . insulin aspart  0-9 Units Subcutaneous TID WC  . insulin detemir  12 Units Subcutaneous QHS  . labetalol  200 mg Oral BID  . multivitamin  1 tablet Oral QHS  . pantoprazole  40 mg Oral Q0600  . traZODone  50 mg Oral QHS   Continuous Infusions:   LOS: 2 days   Geradine Girt  [facility]   How to contact the [facility] Attending or Consulting provider [place] or covering provider during after hours [place], for this patient?  1. Check the care team in [facility] and look for a) attending/consulting TRH provider listed and b) the [facility] team listed 2. Log into [email or web address] and use [place]'s universal password to access. If you do not have the password, please contact the hospital operator. 3. Locate the [facility] provider you are looking for under [facility] and page to a number that you can be directly reached. 4. If you still have difficulty reaching the provider, please page the [facility] (Director on Call) for the Hospitalists listed on amion for assistance.  11/19/2018, 1:42 PM

## 2018-11-19 NOTE — TOC Progression Note (Signed)
Transition of Care [facility]) - Progression Note    Patient Details  Name: [name] MRN: [id number] Date of Birth: 1965-11-08  Transition of Care [facility]) CM/SW [name], LCSW Phone Number: 11/19/2018, 12:57 PM  Clinical Narrative:    CSW touched base with patient regarding discharge plan. She reported that she did get the apartment but that she is not sure of her move-in date yet and will need to return to [facility] at discharge for a few days to get her stuff when the apartment opens. [facility] updated.      Barriers to Discharge: Continued Medical Work up  Expected Discharge Plan and Services   In-house Referral: Clinical Social Work     Living arrangements for the past 2 months: [place]                           Social Determinants of Health (SDOH) Interventions    Readmission Risk Interventions Readmission Risk Prevention Plan 11/17/2018  SW Recovery Care/Counseling Consult Complete  Some recent data might be hidden

## 2018-11-19 NOTE — Progress Notes (Addendum)
[facility] Progress Note   Subjective: Seen on HD. No C/Os. No further bloody stools. Colonoscopy 11/18/18.   Objective Vitals:   11/19/18 0657 11/19/18 0730 11/19/18 0800 11/19/18 0830  BP: 139/66 (!) 159/65 (!) 156/80 (!) 167/82  Pulse: 79 81 80 80  Resp:      Temp:      TempSrc:      SpO2:      Weight:      Height:       Physical Exam General: [place], NAD Heart: S1,S2 RRR Lungs: CTAB A/P Abdomen: Active BS Extremities: trace-1+ BLE edema Dialysis Access: L AVG cannulated at present   Additional Objective Labs: Basic Metabolic Panel: Recent Labs  Lab 11/17/18 1016 11/18/18 0213 11/19/18 0349  NA 132* 138 133*  K 6.1* 3.8 4.5  CL 87* 96* 90*  CO2 29 28 26   GLUCOSE 220* 181* 252*  BUN 82* 31* 48*  CREATININE 9.01* 4.65* 6.39*  CALCIUM 8.9 8.9 9.1   Liver Function Tests: Recent Labs  Lab 11/17/18 1016  AST 38  ALT 30  ALKPHOS 116  BILITOT 0.9  PROT 6.6  ALBUMIN 3.3*   Recent Labs  Lab 11/17/18 1016  LIPASE 58*   CBC: Recent Labs  Lab 11/17/18 1016 11/18/18 0213 11/18/18 1630 11/19/18 0349  WBC 3.1* 5.0 5.0 4.0  HGB 5.9* 7.3* 7.5* 6.6*  HCT 19.7* 22.5* 22.7* 20.7*  MCV 105.3* 95.3 95.4 96.3  PLT 129* 109* 93* 106*   Blood Culture    Component Value Date/Time   SDES BLOOD RIGHT UPPER ARM 02/25/2017 1927   SPECREQUEST  02/25/2017 1927    BOTTLES DRAWN AEROBIC AND ANAEROBIC Blood Culture adequate volume   CULT NO GROWTH 5 DAYS 02/25/2017 1927   REPTSTATUS 03/02/2017 FINAL 02/25/2017 1927    Cardiac Enzymes: No results for input(s): CKTOTAL, CKMB, CKMBINDEX, TROPONINI in the last 168 hours. CBG: Recent Labs  Lab 11/17/18 2303 11/18/18 0747 11/18/18 1204 11/18/18 1826 11/18/18 2112  GLUCAP 151* 202* 198* 235* 368*   Iron Studies:  Recent Labs    11/17/18 1750  IRON 78  TIBC 307   @[email or web address] @ Studies/Results: No results found. Medications:  . sodium chloride   Intravenous Once  . sodium chloride    Intravenous Once  . sodium chloride   Intravenous Once  . allopurinol  100 mg Oral Q T,Th,Sat-1800  . amLODipine  10 mg Oral QHS  . atorvastatin  40 mg Oral QHS  . calcitRIOL  2 mcg Oral Q T,Th,Sa-HD  . calcium acetate  1,334 mg Oral TID WC  . Chlorhexidine Gluconate Cloth  6 each Topical Q0600  . DULoxetine  90 mg Oral Daily  . insulin aspart  0-9 Units Subcutaneous TID WC  . insulin detemir  12 Units Subcutaneous QHS  . labetalol  200 mg Oral BID  . multivitamin  1 tablet Oral QHS  . oxyCODONE      . pantoprazole  40 mg Oral Q0600  . traZODone  50 mg Oral QHS     G/O  TTS  3.5h  73.5kg   2/2 bath P4   LFA/ AVG   No heparin -Mircera 100 mcg IV q 2 weeks (last dose 10/24/2018,Tends to skip HD on Thursdays when labs and ESA are due).  -Venofer 50 mg IV weekly (last dose 10/29/2018 no recent [place]) -Calcitriol 2.0 mcg PO TIW (last PTH 788 Last Phos 4.2 10/13/18)  Assessment/Plan: 1.  LGIB-GI consulted. Hb 7.5 after 2u prbc yesterday. Had  upper/ lower endo today.  Per GI.  2. Hyperkalemia- better.  3.  ESRD - T,Th,S via L AVG. No heparin. HD Sat if here.  4.  Hypertension/volume  -  Pre wt 75.3 UFG 2.0 liters today. SBP 170s-had BLE edema. Challenge extra 0.5 kg today.  Cont Amlodipine and labetalol 200 mg PO BID.  5.  Anemia-Anemia of CKD/ABL from GIB. HGB 6.6 today. Give 1 unit PRBCs.  6.  Metabolic bone disease - C Ca 9.5. Continue VDRA/calcium acetate binders when eating. Give IV hectorol with HD today.  7.  Nutrition -  Renal/Carb mod diet when eating. Albumin 3.3.  8.  DM- per primary  Rita H. Brown NP-C 11/19/2018, 8:59 AM  [facility] [phone]  Pt seen, examined and agree w A/P as above.  [facility] 11/19/2018, 3:29 PM

## 2018-11-19 NOTE — NC FL2 (Signed)
[place] LEVEL OF CARE SCREENING TOOL     IDENTIFICATION  Patient Name: [name] Birthdate: April 03, 1966 Sex: female Admission Date (Current Location): 11/17/2018  [facility] and [place] Number:  Herbalist and Address:  [facility] [place]. [facility], [place] [address], [place], [place] [address]      Provider Number: [id number]  Attending Physician Name and Address:  Geradine Girt, DO  Relative Name and Phone Number:  Miyu Fenderson (Son)    Current Level of Care: Hospital Recommended Level of Care: [place] Prior Approval Number:    Date Approved/Denied:   PASRR Number: [id number]  Discharge Plan: SNF    Current Diagnoses: Patient Active Problem List   Diagnosis Date Noted  . Gastritis and gastroduodenitis   . Benign neoplasm of ascending colon   . Benign neoplasm of transverse colon   . Benign neoplasm of descending colon   . GI bleeding 11/17/2018  . Acute blood loss anemia   . No blood products   . GI bleed 10/05/2018  . EKG abnormality 10/05/2018  . Localized edema due to fluid overload 09/05/2017  . Lower GI bleed   . Evaluation by psychiatric service required 07/17/2017  . Volume overload 06/03/2017  . Right supracondylar humerus fracture, with nonunion, subsequent encounter 05/01/2017  . Malnutrition of moderate degree 02/28/2017  . Leg pain, left 02/25/2017  . Left leg pain 02/25/2017  . External hemorrhoids   . Diverticulosis of colon without hemorrhage   . Goals of care, counseling/discussion   . Palliative care encounter   . Cellulitis of left leg 01/16/2017  . Upper GI bleed 01/16/2017  . UGIB (upper gastrointestinal bleed)   . Anemia due to chronic kidney disease   . Cellulitis of left thigh 01/01/2017  . ESRD on dialysis ([place]) 01/01/2017  . Cocaine use disorder, severe, dependence ([place]) 08/29/2016  . Hepatitis C 08/29/2016  . Diabetic ulcer of calf ([place]) 08/29/2016  . Diabetic ulcer of toe  ([place]) 08/29/2016  . Cerebrovascular accident (CVA) ([place]) 08/29/2016  . MDD (major depressive disorder) 08/28/2016  . Dyslipidemia associated with type 2 diabetes mellitus ([place]) 11/10/2015  . Hyperkalemia 09/17/2015  . History of vitamin D deficiency 06/20/2015  . Long term prescription opiate use 06/20/2015  . Chronic pain syndrome 06/19/2015  . Anemia of chronic kidney failure 05/01/2015  . transmetatarsal amputation of the left foot 12/13/2014  . Unilateral visual loss 11/22/2014  . 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 ([place]) 11/22/2014  . Essential hypertension 11/09/2014  . Dizziness   . Chronic combined systolic (congestive) and diastolic (congestive) heart failure ([place])   . Rectal bleeding 04/23/2014  . Anemia, iron deficiency 04/13/2014  . Noncompliance 02/02/2014  . Hyponatremia 05/19/2013  . Seizure disorder ([place]) 02/09/2013    Orientation RESPIRATION BLADDER Height & Weight     Self, Time, Situation, Place  Normal Continent Weight: 160 lb 7.9 oz (72.8 kg) Height:  5\' 4"  (162.6 cm)  BEHAVIORAL SYMPTOMS/MOOD NEUROLOGICAL BOWEL NUTRITION STATUS      Continent Diet(Please see DC Summary)  AMBULATORY STATUS COMMUNICATION OF NEEDS Skin   Supervision Verbally Other (Comment)(Non-pressure wound on foot)                       Personal Care Assistance Level of Assistance  Bathing, Feeding, Dressing Bathing Assistance: Limited assistance Feeding assistance: Independent Dressing Assistance: Independent     Functional Limitations Info  Sight, Hearing, Speech Sight Info: Adequate  Hearing Info: Adequate Speech Info: Adequate    SPECIAL CARE FACTORS FREQUENCY                       Contractures Contractures Info: Not present    Additional Factors Info  Code Status, Allergies, Psychotropic, Insulin Sliding Scale, Isolation Precautions Code Status Info: Full Allergies Info: Penicillins, Gabapentin, Lyrica Pregabalin, Saphris Asenapine, Tramadol,  Adhesive Tape, Latex, Other Psychotropic Info: Cymbalta, trazadone Insulin Sliding Scale Info: See DC Summary Isolation Precautions Info: MRSA in the nose     Current Medications (11/19/2018):  This is the current hospital active medication list Current Facility-Administered Medications  Medication Dose Route Frequency Provider Last Rate Last Dose  . 0.9 %  sodium chloride infusion (Manually program via Guardrails IV Fluids)   Intravenous Once Karmen Bongo, MD   Stopped at 11/17/18 2048  . 0.9 %  sodium chloride infusion (Manually program via Guardrails IV Fluids)   Intravenous Once Valentina Gu, NP   Stopped at 11/17/18 2047  . 0.9 %  sodium chloride infusion (Manually program via Guardrails IV Fluids)   Intravenous Once Vann, Jessica U, DO      . acetaminophen (TYLENOL) tablet 650 mg  650 mg Oral Q6H PRN Karmen Bongo, MD       Or  . acetaminophen (TYLENOL) suppository 650 mg  650 mg Rectal Q6H PRN Karmen Bongo, MD      . albuterol (PROVENTIL) (2.5 MG/3ML) 0.083% nebulizer solution 3 mL  3 mL Inhalation Q4H PRN Karmen Bongo, MD      . allopurinol (ZYLOPRIM) tablet 100 mg  100 mg Oral Q Verdie Shire, MD      . amLODipine (NORVASC) tablet 10 mg  10 mg Oral Ivery Quale, MD      . atorvastatin (LIPITOR) tablet 40 mg  40 mg Oral Ivery Quale, MD   40 mg at 11/18/18 2154  . calcitRIOL (ROCALTROL) capsule 2 mcg  2 mcg Oral Q T,Th,Sa-HD Valentina Gu, NP   2 mcg at 11/17/18 1900  . calcium acetate (PHOSLO) capsule 1,334 mg  1,334 mg Oral TID WC Karmen Bongo, MD   667 mg at 11/18/18 1833  . calcium carbonate (dosed in mg elemental calcium) suspension 500 mg of elemental calcium  500 mg of elemental calcium Oral Q6H PRN Karmen Bongo, MD      . camphor-[facility]) lotion 1 application  1 application Topical Y4M PRN Karmen Bongo, MD       And  . hydrOXYzine (ATARAX/VISTARIL) tablet 25 mg  25 mg Oral Q8H PRN Karmen Bongo, MD   25  mg at 11/19/18 0906  . Chlorhexidine Gluconate Cloth 2 % PADS 6 each  6 each Topical Q0600 Roney Jaffe, MD   6 each at 11/19/18 0516  . docusate sodium (ENEMEEZ) enema 283 mg  1 enema Rectal PRN Karmen Bongo, MD      . DULoxetine (CYMBALTA) DR capsule 90 mg  90 mg Oral Daily Karmen Bongo, MD   90 mg at 11/19/18 1252  . feeding supplement (NEPRO CARB STEADY) liquid 237 mL  237 mL Oral TID PRN Karmen Bongo, MD      . insulin aspart (novoLOG) injection 0-9 Units  0-9 Units Subcutaneous TID WC Karmen Bongo, MD   3 Units at 11/18/18 1832  . insulin detemir (LEVEMIR) injection 12 Units  12 Units Subcutaneous QHS Karmen Bongo, MD   12 Units at 11/18/18 2155  . labetalol (NORMODYNE) tablet 200  mg  200 mg Oral BID Valentina Gu, NP   200 mg at 11/19/18 1252  . LORazepam (ATIVAN) tablet 0.5 mg  0.5 mg Oral Q8H PRN Karmen Bongo, MD      . methocarbamol (ROBAXIN) tablet 500 mg  500 mg Oral TID PRN Karmen Bongo, MD      . multivitamin (RENA-VIT) tablet 1 tablet  1 tablet Oral Ivery Quale, MD   1 tablet at 11/18/18 2154  . ondansetron (ZOFRAN) tablet 4 mg  4 mg Oral Q6H PRN Karmen Bongo, MD       Or  . ondansetron [facility]) injection 4 mg  4 mg Intravenous Q6H PRN Karmen Bongo, MD   4 mg at 11/17/18 1547  . oxyCODONE (Oxy IR/ROXICODONE) immediate release tablet 10 mg  10 mg Oral Q6H PRN Karmen Bongo, MD   10 mg at 11/19/18 0707  . pantoprazole (PROTONIX) EC tablet 40 mg  40 mg Oral Q0600 Jerene Bears, MD   40 mg at 11/19/18 0515  . sorbitol 70 % solution 30 mL  30 mL Oral PRN Karmen Bongo, MD      . traZODone (DESYREL) tablet 50 mg  50 mg Oral Ivery Quale, MD      . zolpidem Lorrin Mais) tablet 5 mg  5 mg Oral QHS PRN Karmen Bongo, MD         Discharge Medications: Please see discharge summary for a list of discharge medications.  Relevant Imaging Results:  Relevant Lab Results:   Additional Information SS#: [id number]. HD TTS [facility].  Benard Halsted, LCSW

## 2018-11-20 DIAGNOSIS — K2990 Gastroduodenitis, unspecified, without bleeding: Secondary | ICD-10-CM

## 2018-11-20 DIAGNOSIS — K644 Residual hemorrhoidal skin tags: Secondary | ICD-10-CM

## 2018-11-20 DIAGNOSIS — D123 Benign neoplasm of transverse colon: Secondary | ICD-10-CM

## 2018-11-20 DIAGNOSIS — E875 Hyperkalemia: Secondary | ICD-10-CM

## 2018-11-20 DIAGNOSIS — K2970 Gastritis, unspecified, without bleeding: Secondary | ICD-10-CM

## 2018-11-20 DIAGNOSIS — D122 Benign neoplasm of ascending colon: Secondary | ICD-10-CM

## 2018-11-20 DIAGNOSIS — D124 Benign neoplasm of descending colon: Secondary | ICD-10-CM

## 2018-11-20 LAB — BPAM RBC
Blood Product Expiration Date: 202004232359
Blood Product Expiration Date: 202004292359
Blood Product Expiration Date: 202004302359
ISSUE DATE / TIME: 202004141701
ISSUE DATE / TIME: 202004141701
ISSUE DATE / TIME: 202004160909
Unit Type and Rh: 5100
Unit Type and Rh: 5100
Unit Type and Rh: 5100

## 2018-11-20 LAB — CBC
HCT: 23.3 % — ABNORMAL LOW (ref 36.0–46.0)
Hemoglobin: 7.6 g/dL — ABNORMAL LOW (ref 12.0–15.0)
MCH: 31.3 pg (ref 26.0–34.0)
MCHC: 32.6 g/dL (ref 30.0–36.0)
MCV: 95.9 fL (ref 80.0–100.0)
Platelets: 120 10*3/uL — ABNORMAL LOW (ref 150–400)
RBC: 2.43 MIL/uL — ABNORMAL LOW (ref 3.87–5.11)
RDW: 19.7 % — ABNORMAL HIGH (ref 11.5–15.5)
WBC: 4.7 10*3/uL (ref 4.0–10.5)
nRBC: 0 % (ref 0.0–0.2)

## 2018-11-20 LAB — HEPATITIS B CORE ANTIBODY, TOTAL: Hep B Core Total Ab: NEGATIVE

## 2018-11-20 LAB — TYPE AND SCREEN
ABO/RH(D): O POS
Antibody Screen: NEGATIVE
Unit division: 0
Unit division: 0
Unit division: 0

## 2018-11-20 LAB — BASIC METABOLIC PANEL
Anion gap: 11 (ref 5–15)
BUN: 24 mg/dL — ABNORMAL HIGH (ref 6–20)
CO2: 26 mmol/L (ref 22–32)
Calcium: 8.7 mg/dL — ABNORMAL LOW (ref 8.9–10.3)
Chloride: 100 mmol/L (ref 98–111)
Creatinine, Ser: 4.11 mg/dL — ABNORMAL HIGH (ref 0.44–1.00)
GFR calc Af Amer: 14 mL/min — ABNORMAL LOW (ref >60)
GFR calc non Af Amer: 12 mL/min — ABNORMAL LOW (ref >60)
Glucose, Bld: 186 mg/dL — ABNORMAL HIGH (ref 70–99)
Potassium: 3.4 mmol/L — ABNORMAL LOW (ref 3.5–5.1)
Sodium: 137 mmol/L (ref 135–145)

## 2018-11-20 LAB — GLUCOSE, CAPILLARY
Glucose-Capillary: 121 mg/dL — ABNORMAL HIGH (ref 70–99)
Glucose-Capillary: 128 mg/dL — ABNORMAL HIGH (ref 70–99)

## 2018-11-20 LAB — HEMOGLOBIN AND HEMATOCRIT, BLOOD
HCT: 25.6 % — ABNORMAL LOW (ref 36.0–46.0)
Hemoglobin: 8.1 g/dL — ABNORMAL LOW (ref 12.0–15.0)

## 2018-11-20 LAB — HEPATITIS B SURFACE ANTIBODY,QUALITATIVE: Hep B S Ab: NONREACTIVE

## 2018-11-20 MED ORDER — RISAQUAD PO CAPS
2.0000 | ORAL_CAPSULE | Freq: Every day | ORAL | Status: DC
  Administered 2018-11-20: 2 via ORAL
  Filled 2018-11-20: qty 2

## 2018-11-20 MED ORDER — POTASSIUM CHLORIDE CRYS ER 20 MEQ PO TBCR
20.0000 meq | EXTENDED_RELEASE_TABLET | ORAL | Status: AC
  Administered 2018-11-20: 20 meq via ORAL
  Filled 2018-11-20: qty 1

## 2018-11-20 MED ORDER — OXYCODONE HCL 10 MG PO TABS: 10.0000 mg | ORAL_TABLET | Freq: Four times a day (QID) | ORAL | 0 refills | Status: DC | PRN

## 2018-11-20 MED ORDER — LABETALOL HCL 200 MG PO TABS: 200.0000 mg | ORAL_TABLET | Freq: Two times a day (BID) | ORAL | 0 refills | Status: DC

## 2018-11-20 MED ORDER — ALPRAZOLAM 0.5 MG PO TABS: 0.5000 mg | ORAL_TABLET | Freq: Three times a day (TID) | ORAL | 0 refills | Status: DC | PRN

## 2018-11-20 NOTE — TOC Transition Note (Signed)
Transition of Care [facility]) - CM/SW Discharge Note   Patient Details  Name: [name] MRN: [id number] Date of Birth: Oct 01, 1965  [facility]) CM/SW Contact:  Vinie Sill, LCSWA Phone Number:[phone] 11/20/2018, 1:48 PM   Clinical Narrative:     Patient will DC to: [facility]  DC Date: 11/20/2018 Family Notified: [place]ano Heir, Son Transport By: Corey Harold   RN, patient, and facility notified of DC. Discharge Summary sent to facility. RN given number for [phone], room 3251. DC packet on chart. Ambulance transport requested for patient.   Clinical Social Worker signing off. Thurmond Butts, MSW, [facility] Clinical Social Worker [phone]    Final next level of care: Skilled Nursing Facility Barriers to Discharge: No Barriers Identified   Patient Goals and CMS Choice Patient states their goals for this hospitalization and ongoing recovery are:: To get an apartment CMS Medicare.gov Compare Post Acute Care list provided to:: Patient Choice offered to / list presented to : Patient  Discharge Placement   Existing PASRR number confirmed : 11/19/18          Patient [facility] bed at: [facility] Patient to be transferred to facility by: [place] Name of family member notified: MarquisBell , son Patient and family notified of of transfer: 11/20/18  Discharge Plan and Services In-house Referral: Clinical Social Work Discharge Planning Services: NA Post Acute Care Choice: Skilled Nursing Facility(Already at [employer])          DME Arranged: N/A DME Agency: NA HH Arranged: NA HH Agency: NA   Social Determinants of Health ([place]) Interventions     Readmission Risk Interventions Readmission Risk Prevention Plan 11/17/2018  SW Recovery Care/Counseling Consult Complete  Some recent data might be hidden

## 2018-11-20 NOTE — Care Management Important Message (Signed)
Important Message  Patient Details  Name: [name] MRN: [id number] Date of Birth: [birth year]   Medicare Important Message Given:  Yes    Shannara Winbush Montine Circle 11/20/2018, 3:40 PM

## 2018-11-20 NOTE — Discharge Summary (Addendum)
[name], is a 53 y.o. female  DOB 03-Jan-1966  MRN [id number].  Admission date:  11/17/2018  Admitting Physician  Karmen Bongo, MD  Discharge Date:  11/20/2018   Primary MD  Patient, [name]  Recommendations for primary care physician for things to follow:   -Follow-up biopsies taken during EGD and colonoscopy. -Follow-up blood count  Discharge Diagnosis   Principal Problem:   GI bleeding Active Problems:   Seizure disorder (HCC)   Chronic combined systolic (congestive) and diastolic (congestive) heart failure (HCC)   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 ([place])   ESRD on dialysis ([place])   External hemorrhoids   Acute blood loss anemia   Gastritis and gastroduodenitis   Benign neoplasm of ascending colon   Benign neoplasm of transverse colon   Benign neoplasm of descending colon      Past Medical History:  Diagnosis Date  . Anemia   . Anxiety   . Bipolar disorder, unspecified ([place]) 02/06/2014  . Chronic combined systolic and diastolic CHF (congestive heart failure) (HCC)    EF 40% by echo and 48% by stress test  . Chronic pain syndrome   . Depression   . ESRD on hemodialysis (HCC)    TTS  . GERD (gastroesophageal reflux disease)   . Glaucoma   . Headache    sinus headaches  . Hepatitis C   . High cholesterol   . History of hiatal hernia   . History of vitamin D deficiency 06/20/2015  . Hypertension   . MRSA infection ?2014   "on the back of my head; spread throughout my bloodstream"  . Neuropathy   . Refusal of blood transfusions as patient is Jehovah's Witness   . Schizoaffective disorder, unspecified condition 02/08/2014  . Seizure [facility]) dx'd 2014   "don't know what kind; last one was ~ 04/2014"  . TIA (transient ischemic attack) 2010  . Type II diabetes mellitus ([place])    INSULIN  DEPENDENT    Past Surgical History:  Procedure Laterality Date  . ABDOMINAL HYSTERECTOMY  2014  . ABDOMINAL HYSTERECTOMY  2010  . AMPUTATION Left 05/21/2013   Procedure: Left Midfoot AMPUTATION;  Surgeon: Newt Minion, MD;  Location: [place];  Service: Orthopedics;  Laterality: Left;  Left Midfoot Amputation  . AV FISTULA PLACEMENT Left 05/04/2015   Procedure: ARTERIOVENOUS (AV) GRAFT INSERTION;  Surgeon: Angelia Mould, MD;  Location: [place];  Service: Vascular;  Laterality: Left;  . BIOPSY  11/18/2018   Procedure: BIOPSY;  Surgeon: Jerene Bears, MD;  Location: [facility] ENDOSCOPY;  Service: Gastroenterology;;  . COLONOSCOPY WITH PROPOFOL N/A 06/22/2013   Procedure: COLONOSCOPY WITH PROPOFOL;  Surgeon: Jeryl Columbia, MD;  Location: [facility] ENDOSCOPY;  Service: Endoscopy;  Laterality: N/A;  . COLONOSCOPY WITH PROPOFOL N/A 11/18/2018   Procedure: COLONOSCOPY WITH PROPOFOL;  Surgeon: Jerene Bears, MD;  Location: [place];  Service: Gastroenterology;  Laterality: N/A;  . ESOPHAGOGASTRODUODENOSCOPY N/A 05/11/2015   Procedure: ESOPHAGOGASTRODUODENOSCOPY (EGD);  Surgeon: Laurence Spates, MD;  Location: [facility] ENDOSCOPY;  Service: Endoscopy;  Laterality: N/A;  . ESOPHAGOGASTRODUODENOSCOPY (EGD) WITH PROPOFOL N/A 11/18/2018   Procedure: ESOPHAGOGASTRODUODENOSCOPY (EGD) WITH PROPOFOL;  Surgeon: Jerene Bears, MD;  Location: [facility] ENDOSCOPY;  Service: Gastroenterology;  Laterality: N/A;  . EYE SURGERY Bilateral 2010   Lasik  . FLEXIBLE SIGMOIDOSCOPY N/A 01/17/2017   Procedure: FLEXIBLE SIGMOIDOSCOPY w/ hemorrhoid banding possible;  Surgeon: Gatha Mayer, MD;  Location: [facility] ENDOSCOPY;  Service: Endoscopy;  Laterality: N/A;  . FOOT AMPUTATION Left    due diabetic neuropathy, could not feel ulcer on bottom of foot  . POLYPECTOMY  11/18/2018   Procedure: POLYPECTOMY;  Surgeon: Jerene Bears, MD;  Location: [facility] ENDOSCOPY;  Service: Gastroenterology;;  . REFRACTIVE SURGERY Bilateral 2013  . TONSILLECTOMY  1970's  .  TUBAL LIGATION  2000       HPI  from the history and physical done on the day of admission:   [name] is a 53 y.o. female with medical history significant of Jehovah's witness "in training"; ESRD on TTS HD; chronic combined CHF; DM; bipolar; and PVD s/p L TMA presenting with GI bleed.  She was previously admitted one month ago with similar circumstances and agreed to transfusion; she did not have scope during that hospitalization and was recommended to have outpatient f/u.   Yesterday about 5pm, she was watching TV and felt the need to poop.  She noticed straight blood.  It continued every 20 minutes.  At first, it was dark red and foul-smelling.  All subsequent stools have been bright red.  She has not had any stools since being here but had one on the way here.  She does have LLQ and left flank pain.  No fevers.  She started coughing while in the ER, and thinks this is because her blood count is so low.  She is SOB moving around and now - "I need some oxygen."  She does have a sick contact at her facility.  She has HD TTS; she went Saturday but was unable to go today.  ED Course:  Likely lower GI bleed, unknown source.  Repeat bleeding x 2 days.  Hgb dropped to 5.9.  Currently refusing blood, but agreed to transfusion last time and so may agree again.   Hospital Course:   GI bleeding with acute blood loss anemia -Patient with similar admission 1 month ago with rectal bleeding, requiring transfusion -She did not have EGD or colonoscopy at that time and was encouraged to f/u for outpatient evaluation -Presenting with recurrent bleeding since yesterday -Hgb 5.9 on 4/14- s/p 2 units with improvement to 7 -s/p EGD- gastritis/duodenitis- needs protonix daily -s/p colonoscopy: Hemorrhoids found on perianal exam. - The examined portion of the ileum was normal. - One 4 mm polyp in the ascending colon, removed  with a  cold snare. Resected and retrieved. - Two 2 to 5 mm polyps in the transverse colon,  removed with a cold snare. Resected and retrieved. - One 5 mm polyp in the descending colon, removed  with a hot snare. Resected and retrieved. - Diverticulosis in the sigmoid colon. If bleedingacutely present proceed to stat CT angiography of the abdomen pelvis. If recurrent bleeding which cannot be localized by CT angiography, then would consider video capsule endoscopy to examine the remaining small bowel. -Hemoglobin 6.6 and got 1 prbc with HD on  4/16 with repeat 8.8. -Hemoglobin 7.6 on 4/17 with repeat 8.1 g/dL prior to discharge.   ESRD on HD -Patient on  chronic TTS HD -HD last on 4/16   Reported Seizure d/o -She does not appear to be on medications for this history   HTN -Continue Norvasc  Chronic combined CHF -6/17 Echo with EF 45-50% and grade 2 diastolic dysfunction -Appears to be compensated at this time - likely managed effectively through HD  DM -SSI  Thrombocytopenia: Acute on chronic.   -Platelet count last noted to be 120.  No signs of bleeding noted at this time.   Hypokalemia: Replaced.   -Potassium noted to be as low as 2.8.  Prior to discharge potassium 3.4. Patient was given 20 mEq of potassium prior to discharge.  Follow UP     Consults obtained:   Nephrology- Roney Jaffe, MD  Gastroenterology-Jay Hilarie Fredrickson, MD   Discharge Condition: Stable   Diet and Activity recommendation: See Discharge Instructions below  Discharge Instructions    Discharge instructions   Complete by:  As directed    Follow with skilled nursing facility MD within 7 days.  Hemoglobin prior to discharge was 8.1 g/dL Get CBC and Renal function panel -  checked  by SNF MD in 5-7 days ( we routinely change or add medications that can affect your baseline  labs and fluid status, therefore we recommend that you get the mentioned basic workup next visit with your PCP, your PCP may decide not to get them or add new tests based on their clinical decision)  Activity: As tolerated with fall precautions  [facility]  [facility]  Diet: renal/carbohydrate modified diet with fluid restriction of 1200 mL/day           For Heart failure patients - Check your Weight same time everyday, if you gain over 2 pounds, or you develop in leg swelling, experience more shortness of breath or chest pain, call your Primary doctor immediately. Follow Cardiac Low Salt Diet and 1.5 lit/day fluid restriction.  Special Instructions: If you have smoked or chewed Tobacco  in the last 2 yrs please stop smoking, stop any regular Alcohol  and or any Recreational drug use.  On your next visit with your primary care physician please Get Medicines reviewed and adjusted.  Please request your Patient, [name] to go over all Hospital Tests and Procedure/Radiological results at the follow up, please get all Hospital records sent to your Prim MD by signing hospital release before you go home.  If you experience worsening of your admission symptoms, develop shortness of breath, life threatening emergency, suicidal or homicidal thoughts you must seek medical attention immediately by calling 911 or calling your MD immediately  if symptoms less severe.  You Must read complete instructions/literature along with all the possible adverse reactions/side effects for all the Medicines you take and that have been prescribed to you. Take any new Medicines after you have completely understood and accpet all the possible adverse reactions/side effects.   Do not drive, operate heavy machinery, perform activities at heights, swimming or participation in water activities or provide baby sitting services if your were admitted for syncope or siezures until you have seen by Primary MD  or a Neurologist and advised to do so again.  Do not drive when taking Pain medications.  Do not take more than prescribed Pain, Sleep and Anxiety Medications  Wear Seat belts while driving.   Please note  You were cared for by a hospitalist during your hospital stay. If you have any questions about your discharge medications or the care you received while you were in  the hospital after you are discharged, you can call the unit and asked to speak with the hospitalist on call if the hospitalist that took care of you is not available. Once you are discharged, your primary care physician will handle any further medical issues. Please note that NO REFILLS for any discharge medications will be authorized once you are discharged, as it is imperative that you return to your primary care physician (or establish a relationship with a primary care physician if you do not have one) for your aftercare needs so that they can reassess your need for medications and monitor your lab values.        Discharge Medications     Allergies as of 11/20/2018      Reactions   Penicillins Hives, Other (See Comments)   Has patient had a PCN reaction causing immediate rash, facial/tongue/throat swelling, SOB or lightheadedness with hypotension: Yes Has patient had a PCN reaction causing severe rash involving mucus membranes or skin necrosis: No Has patient had a PCN reaction that required hospitalization No Has patient had a PCN reaction occurring within the last 10 years: No If all of the above answers are "NO", then may proceed with Cephalosporin use. Pt tolerates cefepime and ceftriaxone   Gabapentin Itching, Swelling   Lyrica [pregabalin] Swelling, Other (See Comments)   Facial and leg swelling   Saphris [asenapine] Swelling, Other (See Comments)   Facial swelling   Tramadol Swelling, Other (See Comments)   Leg swelling   Adhesive [tape] Itching   SEVERE ITCHING   Latex Itching   SEVERE ITCHING   Other  Other (See Comments)   NO BLOOD PRODUCTS; PATIENT IS A JEHOVAH'S WITNESS      Medication List    STOP taking these medications   LORazepam 0.5 MG tablet Commonly known as:  ATIVAN   mupirocin ointment 2 % Commonly known as:  Bactroban     TAKE these medications   albuterol 108 (90 Base) MCG/ACT inhaler Commonly known as:  VENTOLIN HFA Inhale 2 puffs into the lungs every 4 (four) hours as needed for wheezing or shortness of breath.   allopurinol 100 MG tablet Commonly known as:  ZYLOPRIM Take 1 tablet (100 mg total) by mouth every Tuesday, Thursday, and Saturday at 6 PM.   ALPRAZolam 0.5 MG tablet Commonly known as:  XANAX Take 1 tablet (0.5 mg total) by mouth every 8 (eight) hours as needed for anxiety.   amLODipine 10 MG tablet Commonly known as:  NORVASC Take 1 tablet (10 mg total) by mouth at bedtime.   atorvastatin 40 MG tablet Commonly known as:  LIPITOR Take 1 tablet (40 mg total) by mouth at bedtime.   bisacodyl 10 MG suppository Commonly known as:  DULCOLAX Place 10 mg rectally daily as needed (for constipation).   calcitRIOL 0.25 MCG capsule Commonly known as:  ROCALTROL Take 7 capsules (1.75 mcg total) by mouth every dialysis (3 times per week on TTS).   calcium acetate 667 MG capsule Commonly known as:  PHOSLO Take 2 capsules (1,334 mg total) by mouth 3 (three) times daily with meals.   CEROVITE SENIOR PO Take 1 tablet by mouth at bedtime.   diphenhydrAMINE 25 mg capsule Commonly known as:  BENADRYL Take 25 mg by mouth every 6 (six) hours as needed for itching.   docusate sodium 100 MG capsule Commonly known as:  COLACE Take 100 mg by mouth 2 (two) times daily.   DULoxetine 30 MG capsule Commonly known as:  CYMBALTA Take 90 mg by mouth daily. Take with 60mg  to equal 90mg  once daily   DULoxetine 60 MG capsule Commonly known as:  CYMBALTA Take 60 mg by mouth daily. Take one capsule with 30mg  to equal 90mg .   epoetin alfa 20000 UNIT/ML  injection Commonly known as:  EPOGEN Inject 1 mL (20,000 Units total) into the vein Every Tuesday,Thursday,and Saturday with dialysis.   Ferrous Sulfate 142 (45 Fe) MG Tbcr Commonly known as:  Slow Fe Take 1 tablet by mouth daily.   folic acid 1 MG tablet Commonly known as:  FOLVITE Take 1 tablet (1 mg total) by mouth daily.   hydrocortisone 2.5 % rectal cream Commonly known as:  ANUSOL-HC Place rectally 2 (two) times daily. What changed:    how much to take  when to take this  reasons to take this   insulin aspart 100 UNIT/ML injection Commonly known as:  novoLOG 0-9 Units, Subcutaneous, 3 times daily with meals CBG < 70: implement hypoglycemia protocol CBG 70 - 120: 0 units CBG 121 - 150: 1 unit CBG 151 - 200: 2 units CBG 201 - 250: 3 units CBG 251 - 300: 5 units CBG 301 - 350: 7 units CBG 351 - 400: 9 units CBG > 400: call MD   Insulin Detemir 100 UNIT/ML Pen Commonly known as:  Levemir FlexTouch Inject 12 Units into the skin at bedtime.   labetalol 200 MG tablet Commonly known as:  NORMODYNE Take 1 tablet (200 mg total) by mouth 2 (two) times daily. What changed:    medication strength  how much to take Notes to patient:  Blood pressure medication   methocarbamol 500 MG tablet Commonly known as:  ROBAXIN Take 500 mg by mouth 3 (three) times daily as needed for muscle spasms.   multivitamin Tabs tablet Take 1 tablet by mouth at bedtime.   ondansetron 4 MG tablet Commonly known as:  ZOFRAN Take 4 mg by mouth every 8 (eight) hours as needed for nausea or vomiting.   Oxycodone HCl 10 MG Tabs Take 1 tablet (10 mg total) by mouth every 6 (six) hours as needed (for pain).   pantoprazole 40 MG tablet Commonly known as:  PROTONIX Take 1 tablet (40 mg total) by mouth daily at 6 (six) AM. Notes to patient:  For your stomach   saccharomyces boulardii 250 MG capsule Commonly known as:  FLORASTOR Take 250 mg by mouth daily. Notes to patient:  Probiotic  for your stomach   senna 8.6 MG tablet Commonly known as:  SENOKOT Take 2 tablets by mouth daily. Notes to patient:  Stool softener   traZODone 50 MG tablet Commonly known as:  DESYREL Take 0.5 tablets (25 mg total) by mouth at bedtime. What changed:  how much to take       Major procedures and Radiology Reports - PLEASE review detailed and final reports for all details, in brief -   EGD/colonoscopy performed on 11/18/2018 by Dr. Zenovia Jarred   Micro Results    Recent Results (from the past 240 hour(s))  MRSA PCR Screening     Status: None   Collection Time: 11/17/18  1:49 PM  Result Value Ref Range Status   MRSA by PCR NEGATIVE NEGATIVE Final    Comment:        The GeneXpert MRSA Assay (FDA approved for NASAL specimens only), is one component of a comprehensive MRSA colonization surveillance program. It is not intended to diagnose MRSA infection nor to guide or  monitor treatment for MRSA infections. Performed at [facility], [place] [address] [place], [place] [address]        Today   Subjective    [name] today states that she had no issues overnight.  Denies having abdominal pain or any recurrence of bleeding.  Objective   Blood pressure (!) 143/70, pulse 79, temperature 98.6 F (37 C), temperature source Oral, resp. rate 18, height 5\' 4"  (1.626 m), weight 72.8 kg, SpO2 100 %.   Intake/Output Summary (Last 24 hours) at 11/20/2018 1335 Last data filed at 11/20/2018 1300 Gross per 24 hour  Intake 560 ml  Output -  Net 560 ml    Exam  Constitutional: NAD, calm, comfortable Eyes: PERRL, lids and conjunctivae normal ENMT: Mucous membranes are moist. Posterior pharynx clear of any exudate or lesions.   Neck: normal, supple, no masses, no thyromegaly Respiratory: clear to auscultation bilaterally, no wheezing, no crackles. Normal respiratory effort. No accessory muscle use.  Cardiovascular: Regular rate and rhythm, no murmurs / rubs / gallops. No  extremity edema. 2+ pedal pulses. No carotid bruits.  Left forearm fistula present Abdomen: no tenderness, no masses palpated. No hepatosplenomegaly. Bowel sounds positive.  Musculoskeletal: no clubbing / cyanosis. No joint deformity upper and lower extremities. Good ROM, no contractures. Normal muscle tone.  Skin: no rashes, lesions, ulcers. No induration Neurologic: CN 2-12 grossly intact. Sensation intact, DTR normal. Strength 5/5 in all 4.  Psychiatric: Normal judgment and insight. Alert and oriented x 3. Normal mood.    Data Review   CBC w Diff:  Lab Results  Component Value Date   WBC 4.7 11/20/2018   HGB 8.1 (L) 11/20/2018   HGB 9.3 (L) 07/20/2013   HCT 25.6 (L) 11/20/2018   HCT 28.8 (L) 07/20/2013   PLT 120 (L) 11/20/2018   PLT 271 07/20/2013   LYMPHOPCT 23 10/07/2018   LYMPHOPCT 45.4 07/20/2013   BANDSPCT 0 10/05/2018   MONOPCT 11 10/07/2018   MONOPCT 6.4 07/20/2013   EOSPCT 0 10/07/2018   EOSPCT 1.6 07/20/2013   BASOPCT 0 10/07/2018   BASOPCT 0.7 07/20/2013    CMP:  Lab Results  Component Value Date   NA 137 11/20/2018   NA 130 (A) 12/13/2015   K 3.4 (L) 11/20/2018   CL 100 11/20/2018   CO2 26 11/20/2018   BUN 24 (H) 11/20/2018   BUN 68 (A) 12/13/2015   CREATININE 4.11 (H) 11/20/2018   GLU 129 12/13/2015   PROT 6.6 11/17/2018   ALBUMIN 3.5 11/19/2018   BILITOT 0.9 11/17/2018   ALKPHOS 116 11/17/2018   AST 38 11/17/2018   ALT 30 11/17/2018  .   Total Time in preparing paper work, data evaluation and todays exam - 35 minutes  Norval Morton M.D on 11/20/2018 at 1:35 PM  [facility]   Office  [phone]

## 2018-11-20 NOTE — Progress Notes (Addendum)
[facility] Progress Note   Subjective:  Seen in room. No CP/dyspnea. Has sore throat, but otherwise well. Eating breakfast currently. Tells me she will d/c to [facility] today.  Objective Vitals:   11/19/18 1028 11/19/18 1314 11/19/18 2055 11/20/18 0559  BP: (!) 164/77 (!) 179/73 132/61 (!) 143/70  Pulse: 87 90 80 79  Resp: 18 16 18 18   Temp: 98.3 F (36.8 C) 97.8 F (36.6 C) 99.1 F (37.3 C) 98.5 F (36.9 C)  TempSrc: Oral Oral Oral Oral  SpO2: 99% 96% 92% 100%  Weight: 72.8 kg     Height:       Physical Exam General: Well appearing woman, NAD Heart: RRR; no murmur Lungs: CTAB Abdomen: soft, non-tender Extremities: Trace B LE edema Dialysis Access: L AVG + bruit  Additional Objective Labs: Basic Metabolic Panel: Recent Labs  Lab 11/19/18 0349 11/19/18 1246 11/20/18 0246  NA 133* 134* 137  K 4.5 2.8* 3.4*  CL 90* 94* 100  CO2 26 27 26   GLUCOSE 252* 234* 186*  BUN 48* 13 24*  CREATININE 6.39* 2.83* 4.11*  CALCIUM 9.1 8.9 8.7*  PHOS  --  2.8  --    Liver Function Tests: Recent Labs  Lab 11/17/18 1016 11/19/18 1246  AST 38  --   ALT 30  --   ALKPHOS 116  --   BILITOT 0.9  --   PROT 6.6  --   ALBUMIN 3.3* 3.5   Recent Labs  Lab 11/17/18 1016  LIPASE 58*   CBC: Recent Labs  Lab 11/17/18 1016 11/18/18 0213 11/18/18 1630 11/19/18 0349 11/19/18 1246 11/20/18 0246  WBC 3.1* 5.0 5.0 4.0  --  4.7  HGB 5.9* 7.3* 7.5* 6.6* 8.8* 7.6*  HCT 19.7* 22.5* 22.7* 20.7* 26.4* 23.3*  MCV 105.3* 95.3 95.4 96.3  --  95.9  PLT 129* 109* 93* 106*  --  120*   CBG: Recent Labs  Lab 11/18/18 2112 11/19/18 1234 11/19/18 1649 11/19/18 2125 11/20/18 0734  GLUCAP 368* 195* 225* 161* 121*   Medications:  . sodium chloride   Intravenous Once  . acidophilus  2 capsule Oral Daily  . allopurinol  100 mg Oral Q T,Th,Sat-1800  . amLODipine  10 mg Oral QHS  . atorvastatin  40 mg Oral QHS  . calcitRIOL  2 mcg Oral Q T,Th,Sa-HD  . calcium  acetate  1,334 mg Oral TID WC  . Chlorhexidine Gluconate Cloth  6 each Topical Q0600  . DULoxetine  90 mg Oral Daily  . insulin aspart  0-9 Units Subcutaneous TID WC  . insulin detemir  12 Units Subcutaneous QHS  . labetalol  200 mg Oral BID  . multivitamin  1 tablet Oral QHS  . pantoprazole  40 mg Oral Q0600  . potassium chloride  20 mEq Oral STAT  . traZODone  50 mg Oral QHS    Dialysis Orders: G/O TTS 3.5h 73.5kg 2/2 bath P4 LFA/ AVG No heparin -Mircera 100 mcg IV q 2 weeks (last dose 10/24/2018,Tends to skip HD on Thursdays when labs and ESA are due).  -Venofer 50 mg IV weekly (last dose 10/29/2018 no recent Tsat) -Calcitriol 2.0 mcg PO TIW (last PTH 788 Last Phos 4.2 10/13/18)  Assessment/Plan: 1. LGIB: GI following, s/p EGD/colonoscopy 4/15 with gastritis and mult polyps. S/p 2U PRBCs 4/14 and 1U 4/16. 2.  Hyperkalemia (on admit): Resolved. 3. ESRD: Continue HD per TTS schedule - next 4/18. 4. Hypertension/volume: Continue home meds - challenging EDW as tolerated  d/t edema. 5.  Anemia (see #1): Anemia of CKD/ABL from GIB. S/p 3U PRBCs total. 6. Metabolic bone disease: CorrCa and Phos ok. 7.  Nutrition - Renal/Carb mod diet when eating. Albumin 3.3.  8. DM- per primary 9. Dispo: Ok for d/c from renal standpoint.  Veneta Penton, PA-C 11/20/2018, 10:25 AM  [facility] Pager: [phone]  Pt seen, examined and agree w A/P as above.  [facility] 11/20/2018, 10:55 AM

## 2018-11-20 NOTE — Progress Notes (Signed)
Discharged to [facility] with [facility]. Calling report to [facility]. I discussed AVS and discharge plan with pt who asked questions and verbalized understanding. She had no IV access to remove, left in stable condition.

## 2018-11-25 LAB — GLUCOSE, CAPILLARY
Glucose-Capillary: 149 mg/dL — ABNORMAL HIGH (ref 70–99)
Glucose-Capillary: 183 mg/dL — ABNORMAL HIGH (ref 70–99)

## 2018-11-26 ENCOUNTER — Encounter: Payer: Self-pay | Admitting: Internal Medicine

## 2018-12-29 ENCOUNTER — Other Ambulatory Visit: Payer: Self-pay

## 2018-12-29 ENCOUNTER — Inpatient Hospital Stay (HOSPITAL_COMMUNITY)
Admission: EM | Admit: 2018-12-29 | Discharge: 2018-12-31 | DRG: 377 | Disposition: A | Discharge status: Home Health | Payer: Medicare Other | Attending: Internal Medicine | Admitting: Internal Medicine

## 2018-12-29 DIAGNOSIS — D696 Thrombocytopenia, unspecified: Secondary | ICD-10-CM | POA: Diagnosis present

## 2018-12-29 DIAGNOSIS — E785 Hyperlipidemia, unspecified: Secondary | ICD-10-CM | POA: Diagnosis present

## 2018-12-29 DIAGNOSIS — Z8673 Personal history of transient ischemic attack (TIA), and cerebral infarction without residual deficits: Secondary | ICD-10-CM

## 2018-12-29 DIAGNOSIS — L98499 Non-pressure chronic ulcer of skin of other sites with unspecified severity: Secondary | ICD-10-CM | POA: Diagnosis present

## 2018-12-29 DIAGNOSIS — N186 End stage renal disease: Secondary | ICD-10-CM

## 2018-12-29 DIAGNOSIS — K921 Melena: Principal | ICD-10-CM

## 2018-12-29 DIAGNOSIS — Z992 Dependence on renal dialysis: Secondary | ICD-10-CM

## 2018-12-29 DIAGNOSIS — Z20828 Contact with and (suspected) exposure to other viral communicable diseases: Secondary | ICD-10-CM | POA: Diagnosis present

## 2018-12-29 DIAGNOSIS — Z9115 Patient's noncompliance with renal dialysis: Secondary | ICD-10-CM

## 2018-12-29 DIAGNOSIS — H9202 Otalgia, left ear: Secondary | ICD-10-CM | POA: Diagnosis present

## 2018-12-29 DIAGNOSIS — K625 Hemorrhage of anus and rectum: Secondary | ICD-10-CM | POA: Diagnosis present

## 2018-12-29 DIAGNOSIS — D62 Acute posthemorrhagic anemia: Secondary | ICD-10-CM | POA: Diagnosis present

## 2018-12-29 DIAGNOSIS — B1920 Unspecified viral hepatitis C without hepatic coma: Secondary | ICD-10-CM | POA: Diagnosis present

## 2018-12-29 DIAGNOSIS — Z91048 Other nonmedicinal substance allergy status: Secondary | ICD-10-CM

## 2018-12-29 DIAGNOSIS — Z833 Family history of diabetes mellitus: Secondary | ICD-10-CM

## 2018-12-29 DIAGNOSIS — G894 Chronic pain syndrome: Secondary | ICD-10-CM | POA: Diagnosis present

## 2018-12-29 DIAGNOSIS — R791 Abnormal coagulation profile: Secondary | ICD-10-CM | POA: Diagnosis present

## 2018-12-29 DIAGNOSIS — F17210 Nicotine dependence, cigarettes, uncomplicated: Secondary | ICD-10-CM | POA: Diagnosis present

## 2018-12-29 DIAGNOSIS — L299 Pruritus, unspecified: Secondary | ICD-10-CM | POA: Diagnosis present

## 2018-12-29 DIAGNOSIS — D631 Anemia in chronic kidney disease: Secondary | ICD-10-CM | POA: Diagnosis present

## 2018-12-29 DIAGNOSIS — R21 Rash and other nonspecific skin eruption: Secondary | ICD-10-CM | POA: Diagnosis present

## 2018-12-29 DIAGNOSIS — I132 Hypertensive heart and chronic kidney disease with heart failure and with stage 5 chronic kidney disease, or end stage renal disease: Secondary | ICD-10-CM | POA: Diagnosis present

## 2018-12-29 DIAGNOSIS — E1122 Type 2 diabetes mellitus with diabetic chronic kidney disease: Secondary | ICD-10-CM | POA: Diagnosis present

## 2018-12-29 DIAGNOSIS — Z89432 Acquired absence of left foot: Secondary | ICD-10-CM

## 2018-12-29 DIAGNOSIS — H409 Unspecified glaucoma: Secondary | ICD-10-CM | POA: Diagnosis present

## 2018-12-29 DIAGNOSIS — I5042 Chronic combined systolic (congestive) and diastolic (congestive) heart failure: Secondary | ICD-10-CM | POA: Diagnosis present

## 2018-12-29 DIAGNOSIS — Z79899 Other long term (current) drug therapy: Secondary | ICD-10-CM

## 2018-12-29 DIAGNOSIS — Z79891 Long term (current) use of opiate analgesic: Secondary | ICD-10-CM

## 2018-12-29 DIAGNOSIS — F319 Bipolar disorder, unspecified: Secondary | ICD-10-CM | POA: Diagnosis present

## 2018-12-29 DIAGNOSIS — D509 Iron deficiency anemia, unspecified: Secondary | ICD-10-CM | POA: Diagnosis present

## 2018-12-29 DIAGNOSIS — Z888 Allergy status to other drugs, medicaments and biological substances status: Secondary | ICD-10-CM

## 2018-12-29 DIAGNOSIS — E8889 Other specified metabolic disorders: Secondary | ICD-10-CM | POA: Diagnosis present

## 2018-12-29 DIAGNOSIS — D500 Iron deficiency anemia secondary to blood loss (chronic): Secondary | ICD-10-CM

## 2018-12-29 DIAGNOSIS — N2581 Secondary hyperparathyroidism of renal origin: Secondary | ICD-10-CM | POA: Diagnosis present

## 2018-12-29 DIAGNOSIS — E119 Type 2 diabetes mellitus without complications: Secondary | ICD-10-CM

## 2018-12-29 DIAGNOSIS — E875 Hyperkalemia: Secondary | ICD-10-CM | POA: Diagnosis not present

## 2018-12-29 DIAGNOSIS — F259 Schizoaffective disorder, unspecified: Secondary | ICD-10-CM | POA: Diagnosis present

## 2018-12-29 DIAGNOSIS — Z91040 Latex allergy status: Secondary | ICD-10-CM

## 2018-12-29 DIAGNOSIS — I10 Essential (primary) hypertension: Secondary | ICD-10-CM | POA: Diagnosis present

## 2018-12-29 DIAGNOSIS — Z90710 Acquired absence of both cervix and uterus: Secondary | ICD-10-CM

## 2018-12-29 DIAGNOSIS — Z8249 Family history of ischemic heart disease and other diseases of the circulatory system: Secondary | ICD-10-CM

## 2018-12-29 DIAGNOSIS — E1140 Type 2 diabetes mellitus with diabetic neuropathy, unspecified: Secondary | ICD-10-CM | POA: Diagnosis present

## 2018-12-29 DIAGNOSIS — K219 Gastro-esophageal reflux disease without esophagitis: Secondary | ICD-10-CM | POA: Diagnosis present

## 2018-12-29 DIAGNOSIS — Z794 Long term (current) use of insulin: Secondary | ICD-10-CM

## 2018-12-29 DIAGNOSIS — K644 Residual hemorrhoidal skin tags: Secondary | ICD-10-CM | POA: Diagnosis present

## 2018-12-29 DIAGNOSIS — F419 Anxiety disorder, unspecified: Secondary | ICD-10-CM | POA: Diagnosis present

## 2018-12-29 DIAGNOSIS — D649 Anemia, unspecified: Secondary | ICD-10-CM

## 2018-12-29 DIAGNOSIS — E11622 Type 2 diabetes mellitus with other skin ulcer: Secondary | ICD-10-CM | POA: Diagnosis present

## 2018-12-29 DIAGNOSIS — K5900 Constipation, unspecified: Secondary | ICD-10-CM | POA: Diagnosis present

## 2018-12-29 DIAGNOSIS — R9431 Abnormal electrocardiogram [ECG] [EKG]: Secondary | ICD-10-CM | POA: Diagnosis present

## 2018-12-29 DIAGNOSIS — Z885 Allergy status to narcotic agent status: Secondary | ICD-10-CM

## 2018-12-29 DIAGNOSIS — Z880 Allergy status to penicillin: Secondary | ICD-10-CM

## 2018-12-29 LAB — CBC WITH DIFFERENTIAL/PLATELET
Abs Immature Granulocytes: 0.01 10*3/uL (ref 0.00–0.07)
Basophils Absolute: 0 10*3/uL (ref 0.0–0.1)
Basophils Relative: 1 %
Eosinophils Absolute: 0.1 10*3/uL (ref 0.0–0.5)
Eosinophils Relative: 3 %
HCT: 19.7 % — ABNORMAL LOW (ref 36.0–46.0)
Hemoglobin: 6.4 g/dL — CL (ref 12.0–15.0)
Immature Granulocytes: 0 %
Lymphocytes Relative: 27 %
Lymphs Abs: 0.7 10*3/uL (ref 0.7–4.0)
MCH: 34.2 pg — ABNORMAL HIGH (ref 26.0–34.0)
MCHC: 32.5 g/dL (ref 30.0–36.0)
MCV: 105.3 fL — ABNORMAL HIGH (ref 80.0–100.0)
Monocytes Absolute: 0.2 10*3/uL (ref 0.1–1.0)
Monocytes Relative: 9 %
Neutro Abs: 1.7 10*3/uL (ref 1.7–7.7)
Neutrophils Relative %: 60 %
Platelets: 93 10*3/uL — ABNORMAL LOW (ref 150–400)
RBC: 1.87 MIL/uL — ABNORMAL LOW (ref 3.87–5.11)
RDW: 15.2 % (ref 11.5–15.5)
WBC: 2.7 10*3/uL — ABNORMAL LOW (ref 4.0–10.5)
nRBC: 0 % (ref 0.0–0.2)

## 2018-12-29 LAB — COMPREHENSIVE METABOLIC PANEL
ALT: 56 U/L — ABNORMAL HIGH (ref 0–44)
AST: 58 U/L — ABNORMAL HIGH (ref 15–41)
Albumin: 3.2 g/dL — ABNORMAL LOW (ref 3.5–5.0)
Alkaline Phosphatase: 130 U/L — ABNORMAL HIGH (ref 38–126)
Anion gap: 21 — ABNORMAL HIGH (ref 5–15)
BUN: 151 mg/dL — ABNORMAL HIGH (ref 6–20)
CO2: 19 mmol/L — ABNORMAL LOW (ref 22–32)
Calcium: 8.6 mg/dL — ABNORMAL LOW (ref 8.9–10.3)
Chloride: 93 mmol/L — ABNORMAL LOW (ref 98–111)
Creatinine, Ser: 9.6 mg/dL — ABNORMAL HIGH (ref 0.44–1.00)
GFR calc Af Amer: 5 mL/min — ABNORMAL LOW (ref >60)
GFR calc non Af Amer: 4 mL/min — ABNORMAL LOW (ref >60)
Glucose, Bld: 186 mg/dL — ABNORMAL HIGH (ref 70–99)
Potassium: 6 mmol/L — ABNORMAL HIGH (ref 3.5–5.1)
Sodium: 133 mmol/L — ABNORMAL LOW (ref 135–145)
Total Bilirubin: 0.8 mg/dL (ref 0.3–1.2)
Total Protein: 6.5 g/dL (ref 6.5–8.1)

## 2018-12-29 LAB — GLUCOSE, CAPILLARY
Glucose-Capillary: 96 mg/dL (ref 70–99)
Glucose-Capillary: 143 mg/dL — ABNORMAL HIGH (ref 70–99)

## 2018-12-29 LAB — PROTIME-INR
INR: 1.3 — ABNORMAL HIGH (ref 0.8–1.2)
Prothrombin Time: 16.2 seconds — ABNORMAL HIGH (ref 11.4–15.2)

## 2018-12-29 LAB — CBG MONITORING, ED
Glucose-Capillary: 183 mg/dL — ABNORMAL HIGH (ref 70–99)
Glucose-Capillary: 186 mg/dL — ABNORMAL HIGH (ref 70–99)

## 2018-12-29 LAB — HEMOGLOBIN AND HEMATOCRIT, BLOOD
HCT: 26.1 % — ABNORMAL LOW (ref 36.0–46.0)
Hemoglobin: 9 g/dL — ABNORMAL LOW (ref 12.0–15.0)

## 2018-12-29 LAB — SARS CORONAVIRUS 2 BY RT PCR (HOSPITAL ORDER, PERFORMED IN ~~LOC~~ HOSPITAL LAB): SARS Coronavirus 2: NEGATIVE

## 2018-12-29 LAB — POC OCCULT BLOOD, ED: Fecal Occult Bld: POSITIVE — AB

## 2018-12-29 LAB — PREPARE RBC (CROSSMATCH)

## 2018-12-29 MED ORDER — DEXTROSE 50 % IV SOLN
1.0000 | Freq: Once | INTRAVENOUS | Status: AC
  Administered 2018-12-29: 50 mL via INTRAVENOUS
  Filled 2018-12-29: qty 50

## 2018-12-29 MED ORDER — PANTOPRAZOLE SODIUM 40 MG IV SOLR
40.0000 mg | Freq: Once | INTRAVENOUS | Status: AC
  Administered 2018-12-29: 40 mg via INTRAVENOUS
  Filled 2018-12-29: qty 40

## 2018-12-29 MED ORDER — PENTAFLUOROPROP-TETRAFLUOROETH EX AERO: 1.0000 "application " | INHALATION_SPRAY | CUTANEOUS | Status: DC | PRN

## 2018-12-29 MED ORDER — LABETALOL HCL 200 MG PO TABS
200.0000 mg | ORAL_TABLET | Freq: Two times a day (BID) | ORAL | Status: DC
  Administered 2018-12-29 – 2018-12-30 (×3): 200 mg via ORAL
  Filled 2018-12-29 (×3): qty 1

## 2018-12-29 MED ORDER — DARBEPOETIN ALFA 200 MCG/0.4ML IJ SOSY
200.0000 ug | PREFILLED_SYRINGE | INTRAMUSCULAR | Status: DC
  Administered 2018-12-29: 200 ug via INTRAVENOUS

## 2018-12-29 MED ORDER — SODIUM CHLORIDE 0.9 % IV SOLN: 100.0000 mL | INTRAVENOUS | Status: DC | PRN

## 2018-12-29 MED ORDER — ACETAMINOPHEN 650 MG RE SUPP: 650.0000 mg | Freq: Four times a day (QID) | RECTAL | Status: DC | PRN

## 2018-12-29 MED ORDER — INSULIN ASPART 100 UNIT/ML IV SOLN
5.0000 [IU] | Freq: Once | INTRAVENOUS | Status: AC
  Administered 2018-12-29: 5 [IU] via INTRAVENOUS

## 2018-12-29 MED ORDER — LIDOCAINE HCL (PF) 1 % IJ SOLN: 5.0000 mL | INTRAMUSCULAR | Status: DC | PRN

## 2018-12-29 MED ORDER — BISACODYL 10 MG RE SUPP: 10.0000 mg | Freq: Every day | RECTAL | Status: DC | PRN

## 2018-12-29 MED ORDER — CALCITRIOL 0.25 MCG PO CAPS
ORAL_CAPSULE | ORAL | Status: AC
  Filled 2018-12-29: qty 1

## 2018-12-29 MED ORDER — DULOXETINE HCL 60 MG PO CPEP
90.0000 mg | ORAL_CAPSULE | Freq: Every day | ORAL | Status: DC
  Administered 2018-12-29 – 2018-12-30 (×2): 90 mg via ORAL
  Filled 2018-12-29 (×2): qty 1

## 2018-12-29 MED ORDER — MUPIROCIN 2 % EX OINT: 1.0000 "application " | TOPICAL_OINTMENT | CUTANEOUS | Status: DC | PRN

## 2018-12-29 MED ORDER — OXYCODONE HCL 5 MG PO TABS
10.0000 mg | ORAL_TABLET | Freq: Four times a day (QID) | ORAL | Status: DC | PRN
  Administered 2018-12-29: 10 mg via ORAL
  Filled 2018-12-29: qty 2

## 2018-12-29 MED ORDER — SENNOSIDES-DOCUSATE SODIUM 8.6-50 MG PO TABS
1.0000 | ORAL_TABLET | Freq: Two times a day (BID) | ORAL | Status: DC
  Administered 2018-12-29 – 2018-12-30 (×3): 1 via ORAL
  Filled 2018-12-29 (×3): qty 1

## 2018-12-29 MED ORDER — ATORVASTATIN CALCIUM 40 MG PO TABS
40.0000 mg | ORAL_TABLET | Freq: Every day | ORAL | Status: DC
  Administered 2018-12-29 – 2018-12-30 (×2): 40 mg via ORAL
  Filled 2018-12-29 (×2): qty 1

## 2018-12-29 MED ORDER — INSULIN ASPART 100 UNIT/ML ~~LOC~~ SOLN
0.0000 [IU] | Freq: Three times a day (TID) | SUBCUTANEOUS | Status: DC
  Administered 2018-12-30: 5 [IU] via SUBCUTANEOUS
  Administered 2018-12-30: 2 [IU] via SUBCUTANEOUS

## 2018-12-29 MED ORDER — CHLORHEXIDINE GLUCONATE CLOTH 2 % EX PADS: 6.0000 | MEDICATED_PAD | Freq: Every day | CUTANEOUS | Status: DC

## 2018-12-29 MED ORDER — CALCITRIOL 0.5 MCG PO CAPS
ORAL_CAPSULE | ORAL | Status: AC
  Filled 2018-12-29: qty 1

## 2018-12-29 MED ORDER — DARBEPOETIN ALFA 200 MCG/0.4ML IJ SOSY
PREFILLED_SYRINGE | INTRAMUSCULAR | Status: AC
  Filled 2018-12-29: qty 0.4

## 2018-12-29 MED ORDER — SODIUM CHLORIDE 0.9% IV SOLUTION: Freq: Once | INTRAVENOUS | Status: DC

## 2018-12-29 MED ORDER — CALCITRIOL 0.5 MCG PO CAPS: 1.7500 ug | ORAL_CAPSULE | ORAL | Status: DC

## 2018-12-29 MED ORDER — DIPHENHYDRAMINE HCL 25 MG PO CAPS
25.0000 mg | ORAL_CAPSULE | Freq: Four times a day (QID) | ORAL | Status: DC | PRN
  Administered 2018-12-29: 25 mg via ORAL
  Filled 2018-12-29: qty 1

## 2018-12-29 MED ORDER — DULOXETINE HCL 60 MG PO CPEP: 60.0000 mg | ORAL_CAPSULE | Freq: Every day | ORAL | Status: DC

## 2018-12-29 MED ORDER — ALPRAZOLAM 0.5 MG PO TABS: 0.5000 mg | ORAL_TABLET | Freq: Three times a day (TID) | ORAL | Status: DC | PRN

## 2018-12-29 MED ORDER — ALBUTEROL SULFATE (2.5 MG/3ML) 0.083% IN NEBU: 2.5000 mg | INHALATION_SOLUTION | RESPIRATORY_TRACT | Status: DC | PRN

## 2018-12-29 MED ORDER — DULOXETINE HCL 30 MG PO CPEP: 30.0000 mg | ORAL_CAPSULE | Freq: Every day | ORAL | Status: DC

## 2018-12-29 MED ORDER — SACCHAROMYCES BOULARDII 250 MG PO CAPS
250.0000 mg | ORAL_CAPSULE | Freq: Every day | ORAL | Status: DC
  Administered 2018-12-29 – 2018-12-30 (×2): 250 mg via ORAL
  Filled 2018-12-29 (×2): qty 1

## 2018-12-29 MED ORDER — HYDROCORTISONE 2.5 % RE CREA
TOPICAL_CREAM | Freq: Two times a day (BID) | RECTAL | Status: DC
  Filled 2018-12-29 (×2): qty 28.35

## 2018-12-29 MED ORDER — LIDOCAINE-PRILOCAINE 2.5-2.5 % EX CREA: 1.0000 "application " | TOPICAL_CREAM | CUTANEOUS | Status: DC

## 2018-12-29 MED ORDER — FERROUS SULFATE ER 142 (45 FE) MG PO TBCR: 1.0000 | EXTENDED_RELEASE_TABLET | Freq: Every day | ORAL | Status: DC

## 2018-12-29 MED ORDER — ALBUTEROL SULFATE (2.5 MG/3ML) 0.083% IN NEBU
10.0000 mg | INHALATION_SOLUTION | Freq: Once | RESPIRATORY_TRACT | Status: AC
  Administered 2018-12-29: 10 mg via RESPIRATORY_TRACT
  Filled 2018-12-29: qty 12

## 2018-12-29 MED ORDER — HEPARIN SODIUM (PORCINE) 1000 UNIT/ML DIALYSIS: 1000.0000 [IU] | INTRAMUSCULAR | Status: DC | PRN

## 2018-12-29 MED ORDER — ACETAMINOPHEN 325 MG PO TABS: 650.0000 mg | ORAL_TABLET | Freq: Four times a day (QID) | ORAL | Status: DC | PRN

## 2018-12-29 MED ORDER — LIDOCAINE-PRILOCAINE 2.5-2.5 % EX CREA: 1.0000 "application " | TOPICAL_CREAM | CUTANEOUS | Status: DC | PRN

## 2018-12-29 MED ORDER — CALCITRIOL 0.5 MCG PO CAPS
1.7500 ug | ORAL_CAPSULE | ORAL | Status: DC | PRN
  Administered 2018-12-29: 1.75 ug via ORAL

## 2018-12-29 MED ORDER — CALCIUM ACETATE (PHOS BINDER) 667 MG PO CAPS
1334.0000 mg | ORAL_CAPSULE | Freq: Three times a day (TID) | ORAL | Status: DC
  Administered 2018-12-29 – 2018-12-30 (×2): 1334 mg via ORAL
  Filled 2018-12-29 (×3): qty 2

## 2018-12-29 MED ORDER — FERROUS SULFATE 325 (65 FE) MG PO TABS
325.0000 mg | ORAL_TABLET | Freq: Every day | ORAL | Status: DC
  Administered 2018-12-30: 325 mg via ORAL
  Filled 2018-12-29: qty 1

## 2018-12-29 MED ORDER — ALLOPURINOL 100 MG PO TABS: 100.0000 mg | ORAL_TABLET | ORAL | Status: DC

## 2018-12-29 MED ORDER — FOLIC ACID 1 MG PO TABS
1.0000 mg | ORAL_TABLET | Freq: Every day | ORAL | Status: DC
  Administered 2018-12-29 – 2018-12-30 (×2): 1 mg via ORAL
  Filled 2018-12-29 (×2): qty 1

## 2018-12-29 NOTE — Progress Notes (Signed)
   12/29/18 1650  Hand-Off documentation  Report given to (Full Name) Encarnacion Slates RN  Report received from (Full Name) Delrae Rend RN  Vital Signs  Temp 98.1 F (36.7 C)  Temp Source Oral  Pulse Rate 90  Pulse Rate Source Monitor  Resp (!) 22  BP (!) 196/88  Oxygen Therapy  O2 Device Room Air  Pain Assessment  Pain Scale 0-10  Pain Score 0  Dialysis Weight  Weight 75.8 kg  Type of Weight Post-Dialysis  During Hemodialysis Assessment  Intra-Hemodialysis Comments Tx completed  Post-Hemodialysis Assessment  Rinseback Volume (mL) 250 mL  KECN 234 V  Dialyzer Clearance Lightly streaked  Duration of HD Treatment -hour(s) 3.5 hour(s)  Hemodialysis Intake (mL) 500 mL  UF Total -Machine (mL) 2600 mL  Net UF (mL) 2100 mL  Tolerated HD Treatment Yes  Post-Hemodialysis Comments 2 units RBC given  AVG/AVF Arterial Site Held (minutes) 10 minutes  AVG/AVF Venous Site Held (minutes) 10 minutes

## 2018-12-29 NOTE — Consult Note (Addendum)
Referring Provider:  ED - Arlean Hopping, PA-C          Primary Care Physician:  Patient, [name] Primary Gastroenterologist:  unassigned           Reason for Consultation:  GI bleed          ASSESSMENT / PLAN:    25. 53 yo female with ESRD on HD here with recurrent painless hematochezia and acute on chronic anemia. Negative bleeding scan in March and unrevealing EGD and colonoscopy during mid April admission for hematochezia. Diverticular hemorrhage? AVMs?  - If continues to bleed consider CT angio -Two units of blood has already been ordered. Sounds like she may be getting dialyzed today.   2. Thrombocytopenia, chronic but slightly progressive. Platelet count 93, down from low 100s in April. Also, mildly coagulopathic with mild hypoalbuminemia. Looking back an [place] in Oct 2016 raises concern for cirrhosis though no evidence for portal HTN on recent EGD.    HPI:   [name] is a 53 y.o. female with ESRD on HD T/TH/Sa, DM, and HTN  She is in ED now with hematochezia and anemia. She was hospitalized in early March with lower GI bleeding, bleeding scan was negative. We saw her mid April 2020 when readmitted with recurrent GI bleeding. EGD that admission showed gastritis / duodenitis. Gastric bx c/w reactive gastropathy, no H. Pylori. Colonoscopy (complete with good prep) revealed hemorrhoids and sigmoid diverticulosis. Several small polyps (non-adenomatous) removed.. She received 2 uPRBC that admission for a hgb of 5.9. Departing hgb on 11/20/18 was 8.1.  In ED today her hgb is down to 6.4. She began having painless hematochezia yesterday and has had several episodes since onset. She describes the blood as dark red with clots. ED provider documented maroon stool. She has no associated N/V. Mainly feels weak and has some SOB with ambulation. No NSAID use, no blood thinners.   ED EVALUATION:  VSS.  Hgb 6.4 <<< 8.1 on 11/20/18 MCV 105 ALK phos 130 , AST 58, ALT 56, Tbili 0.8   Past Medical  History:  Diagnosis Date  . Anemia   . Anxiety   . Bipolar disorder, unspecified ([place]) 02/06/2014  . Chronic combined systolic and diastolic CHF (congestive heart failure) (HCC)    EF 40% by echo and 48% by stress test  . Chronic pain syndrome   . Depression   . ESRD on hemodialysis (HCC)    TTS  . GERD (gastroesophageal reflux disease)   . Glaucoma   . Headache    sinus headaches  . Hepatitis C   . High cholesterol   . History of hiatal hernia   . History of vitamin D deficiency 06/20/2015  . Hypertension   . MRSA infection ?2014   "on the back of my head; spread throughout my bloodstream"  . Neuropathy   . Refusal of blood transfusions as patient is Jehovah's Witness   . Schizoaffective disorder, unspecified condition 02/08/2014  . Seizure [facility]) dx'd 2014   "don't know what kind; last one was ~ 04/2014"  . TIA (transient ischemic attack) 2010  . Type II diabetes mellitus ([place])    INSULIN DEPENDENT    Past Surgical History:  Procedure Laterality Date  . ABDOMINAL HYSTERECTOMY  2014  . ABDOMINAL HYSTERECTOMY  2010  . AMPUTATION Left 05/21/2013   Procedure: Left Midfoot AMPUTATION;  Surgeon: Newt Minion, MD;  Location: [place];  Service: Orthopedics;  Laterality: Left;  Left Midfoot Amputation  .  AV FISTULA PLACEMENT Left 05/04/2015   Procedure: ARTERIOVENOUS (AV) GRAFT INSERTION;  Surgeon: Angelia Mould, MD;  Location: [place] [facility];  Service: Vascular;  Laterality: Left;  . BIOPSY  11/18/2018   Procedure: BIOPSY;  Surgeon: Jerene Bears, MD;  Location: [facility] ENDOSCOPY;  Service: Gastroenterology;;  . COLONOSCOPY WITH PROPOFOL N/A 06/22/2013   Procedure: COLONOSCOPY WITH PROPOFOL;  Surgeon: Jeryl Columbia, MD;  Location: [facility] ENDOSCOPY;  Service: Endoscopy;  Laterality: N/A;  . COLONOSCOPY WITH PROPOFOL N/A 11/18/2018   Procedure: COLONOSCOPY WITH PROPOFOL;  Surgeon: Jerene Bears, MD;  Location: [place];  Service: Gastroenterology;  Laterality: N/A;  . ESOPHAGOGASTRODUODENOSCOPY  N/A 05/11/2015   Procedure: ESOPHAGOGASTRODUODENOSCOPY (EGD);  Surgeon: Laurence Spates, MD;  Location: [facility] ENDOSCOPY;  Service: Endoscopy;  Laterality: N/A;  . ESOPHAGOGASTRODUODENOSCOPY (EGD) WITH PROPOFOL N/A 11/18/2018   Procedure: ESOPHAGOGASTRODUODENOSCOPY (EGD) WITH PROPOFOL;  Surgeon: Jerene Bears, MD;  Location: [facility] ENDOSCOPY;  Service: Gastroenterology;  Laterality: N/A;  . EYE SURGERY Bilateral 2010   Lasik  . FLEXIBLE SIGMOIDOSCOPY N/A 01/17/2017   Procedure: FLEXIBLE SIGMOIDOSCOPY w/ hemorrhoid banding possible;  Surgeon: Gatha Mayer, MD;  Location: [facility] ENDOSCOPY;  Service: Endoscopy;  Laterality: N/A;  . FOOT AMPUTATION Left    due diabetic neuropathy, could not feel ulcer on bottom of foot  . POLYPECTOMY  11/18/2018   Procedure: POLYPECTOMY;  Surgeon: Jerene Bears, MD;  Location: [facility] ENDOSCOPY;  Service: Gastroenterology;;  . REFRACTIVE SURGERY Bilateral 2013  . TONSILLECTOMY  1970's  . TUBAL LIGATION  2000    Prior to Admission medications   Medication Sig Start Date End Date Taking? Authorizing Provider  albuterol (PROVENTIL HFA;VENTOLIN HFA) 108 (90 Base) MCG/ACT inhaler Inhale 2 puffs into the lungs every 4 (four) hours as needed for wheezing or shortness of breath. 10/15/16  Yes Horton, Barbette Hair, MD  allopurinol (ZYLOPRIM) 100 MG tablet Take 1 tablet (100 mg total) by mouth every Tuesday, Thursday, and Saturday at 6 PM. 01/25/17  Yes Nita Sells, MD  ALPRAZolam Duanne Moron) 0.5 MG tablet Take 1 tablet (0.5 mg total) by mouth every 8 (eight) hours as needed for anxiety. 11/20/18  Yes Fuller Plan A, MD  atorvastatin (LIPITOR) 40 MG tablet Take 1 tablet (40 mg total) by mouth at bedtime. 09/02/16  Yes Hildred Priest, MD  bisacodyl (DULCOLAX) 10 MG suppository Place 10 mg rectally daily as needed (for constipation).   Yes [provider]  calcium acetate (PHOSLO) 667 MG capsule Take 2 capsules (1,334 mg total) by mouth 3 (three) times daily with meals.  09/02/16  Yes Hildred Priest, MD  diphenhydrAMINE (BENADRYL) 25 mg capsule Take 25 mg by mouth every 6 (six) hours as needed for itching.    Yes [provider]  docusate sodium (COLACE) 100 MG capsule Take 100 mg by mouth 2 (two) times daily.   Yes [provider]  DULoxetine (CYMBALTA) 30 MG capsule Take 30 mg by mouth daily. Take with 60 for a total of 90   Yes [provider]  DULoxetine (CYMBALTA) 60 MG capsule Take 60 mg by mouth daily. Take one capsule with '30mg'$  to equal '90mg'$ .   Yes [provider]  Ferrous Sulfate (SLOW FE) 142 (45 Fe) MG TBCR Take 1 tablet by mouth daily. 07/19/17  Yes Mariel Aloe, MD  folic acid (FOLVITE) 1 MG tablet Take 1 tablet (1 mg total) by mouth daily. 10/10/18  Yes Ghimire, Henreitta Leber, MD  insulin aspart (NOVOLOG) 100 UNIT/ML injection 0-9 Units, Subcutaneous,  3 times daily with meals CBG < 70: implement hypoglycemia protocol CBG 70 - 120: 0 units CBG 121 - 150: 1 unit CBG 151 - 200: 2 units CBG 201 - 250: 3 units CBG 251 - 300: 5 units CBG 301 - 350: 7 units CBG 351 - 400: 9 units CBG > 400: call MD 10/10/18  Yes Ghimire, Henreitta Leber, MD  Insulin Detemir (LEVEMIR FLEXTOUCH) 100 UNIT/ML Pen Inject 12 Units into the skin at bedtime. 10/10/18  Yes Ghimire, Henreitta Leber, MD  labetalol (NORMODYNE) 200 MG tablet Take 1 tablet (200 mg total) by mouth 2 (two) times daily. 11/20/18  Yes Fuller Plan A, MD  lidocaine-prilocaine (EMLA) cream Apply 1 application topically See admin instructions. On dialysis days 12/23/18  Yes [provider]  methocarbamol (ROBAXIN) 500 MG tablet Take 500 mg by mouth 3 (three) times daily as needed for muscle spasms.   Yes [provider]  Multiple Vitamins-Minerals (CEROVITE SENIOR PO) Take 1 tablet by mouth at bedtime.   Yes [provider]  multivitamin (RENA-VIT) TABS tablet Take 1 tablet by mouth at bedtime. 09/02/16  Yes Hildred Priest, MD  mupirocin  ointment (BACTROBAN) 2 % Apply 1 application topically as needed (on arm).  12/01/18  Yes [provider]  ondansetron (ZOFRAN) 4 MG tablet Take 4 mg by mouth every 8 (eight) hours as needed for nausea or vomiting.   Yes [provider]  Oxycodone HCl 10 MG TABS Take 1 tablet (10 mg total) by mouth every 6 (six) hours as needed (for pain). 11/20/18  Yes Fuller Plan A, MD  pantoprazole (PROTONIX) 40 MG tablet Take 1 tablet (40 mg total) by mouth daily at 6 (six) AM. 07/20/17  Yes Mariel Aloe, MD  saccharomyces boulardii (FLORASTOR) 250 MG capsule Take 250 mg by mouth daily.   Yes [provider]  senna (SENOKOT) 8.6 MG tablet Take 2 tablets by mouth daily.   Yes [provider]  traZODone (DESYREL) 50 MG tablet Take 0.5 tablets (25 mg total) by mouth at bedtime. Patient taking differently: Take 50 mg by mouth at bedtime.  07/19/17  Yes Mariel Aloe, MD  amLODipine (NORVASC) 10 MG tablet Take 1 tablet (10 mg total) by mouth at bedtime. Patient not taking: Reported on 12/29/2018 09/02/16   Hildred Priest, MD  calcitRIOL (ROCALTROL) 0.25 MCG capsule Take 7 capsules (1.75 mcg total) by mouth every dialysis (3 times per week on TTS). 10/10/18   Ghimire, Henreitta Leber, MD  epoetin alfa (EPOGEN,PROCRIT) 86578 UNIT/ML injection Inject 1 mL (20,000 Units total) into the vein Every Tuesday,Thursday,and Saturday with dialysis. 10/10/18   Ghimire, Henreitta Leber, MD  hydrocortisone (ANUSOL-HC) 2.5 % rectal cream Place rectally 2 (two) times daily. Patient not taking: Reported on 12/29/2018 01/24/17   Nita Sells, MD    Current Facility-Administered Medications  Medication Dose Route Frequency Provider Last Rate Last Dose  . 0.9 %  sodium chloride infusion (Manually program via Guardrails IV Fluids)   Intravenous Once Joy, Shawn C, PA-C      . Chlorhexidine Gluconate Cloth 2 % PADS 6 each  6 each Topical Q0600 Loren Racer, PA-C       Current Outpatient  Medications  Medication Sig Dispense Refill  . albuterol (PROVENTIL HFA;VENTOLIN HFA) 108 (90 Base) MCG/ACT inhaler Inhale 2 puffs into the lungs every 4 (four) hours as needed for wheezing or shortness of breath. 1 Inhaler 0  . allopurinol (ZYLOPRIM) 100 MG tablet Take 1 tablet (100  mg total) by mouth every Tuesday, Thursday, and Saturday at 6 PM. 60 tablet 1  . ALPRAZolam (XANAX) 0.5 MG tablet Take 1 tablet (0.5 mg total) by mouth every 8 (eight) hours as needed for anxiety. 12 tablet 0  . atorvastatin (LIPITOR) 40 MG tablet Take 1 tablet (40 mg total) by mouth at bedtime. 30 tablet 0  . bisacodyl (DULCOLAX) 10 MG suppository Place 10 mg rectally daily as needed (for constipation).    . calcium acetate (PHOSLO) 667 MG capsule Take 2 capsules (1,334 mg total) by mouth 3 (three) times daily with meals. 240 capsule 0  . diphenhydrAMINE (BENADRYL) 25 mg capsule Take 25 mg by mouth every 6 (six) hours as needed for itching.     . docusate sodium (COLACE) 100 MG capsule Take 100 mg by mouth 2 (two) times daily.    . DULoxetine (CYMBALTA) 30 MG capsule Take 30 mg by mouth daily. Take with 60 for a total of 90    . DULoxetine (CYMBALTA) 60 MG capsule Take 60 mg by mouth daily. Take one capsule with 45m to equal 915m    . Marland Kitchenerrous Sulfate (SLOW FE) 142 (45 Fe) MG TBCR Take 1 tablet by mouth daily. 30 tablet 0  . folic acid (FOLVITE) 1 MG tablet Take 1 tablet (1 mg total) by mouth daily.    . insulin aspart (NOVOLOG) 100 UNIT/ML injection 0-9 Units, Subcutaneous, 3 times daily with meals CBG < 70: implement hypoglycemia protocol CBG 70 - 120: 0 units CBG 121 - 150: 1 unit CBG 151 - 200: 2 units CBG 201 - 250: 3 units CBG 251 - 300: 5 units CBG 301 - 350: 7 units CBG 351 - 400: 9 units CBG > 400: call MD 10 mL 11  . Insulin Detemir (LEVEMIR FLEXTOUCH) 100 UNIT/ML Pen Inject 12 Units into the skin at bedtime. 15 mL 11  . labetalol (NORMODYNE) 200 MG tablet Take 1 tablet (200 mg total) by mouth 2  (two) times daily. 60 tablet 0  . lidocaine-prilocaine (EMLA) cream Apply 1 application topically See admin instructions. On dialysis days    . methocarbamol (ROBAXIN) 500 MG tablet Take 500 mg by mouth 3 (three) times daily as needed for muscle spasms.    . Multiple Vitamins-Minerals (CEROVITE SENIOR PO) Take 1 tablet by mouth at bedtime.    . multivitamin (RENA-VIT) TABS tablet Take 1 tablet by mouth at bedtime. 30 tablet 0  . mupirocin ointment (BACTROBAN) 2 % Apply 1 application topically as needed (on arm).     . ondansetron (ZOFRAN) 4 MG tablet Take 4 mg by mouth every 8 (eight) hours as needed for nausea or vomiting.    . Oxycodone HCl 10 MG TABS Take 1 tablet (10 mg total) by mouth every 6 (six) hours as needed (for pain). 15 tablet 0  . pantoprazole (PROTONIX) 40 MG tablet Take 1 tablet (40 mg total) by mouth daily at 6 (six) AM. 30 tablet 0  . saccharomyces boulardii (FLORASTOR) 250 MG capsule Take 250 mg by mouth daily.    . Marland Kitchenenna (SENOKOT) 8.6 MG tablet Take 2 tablets by mouth daily.    . traZODone (DESYREL) 50 MG tablet Take 0.5 tablets (25 mg total) by mouth at bedtime. (Patient taking differently: Take 50 mg by mouth at bedtime. ) 30 tablet 0  . amLODipine (NORVASC) 10 MG tablet Take 1 tablet (10 mg total) by mouth at bedtime. (Patient not taking: Reported on 12/29/2018) 30 tablet 0  . calcitRIOL (  ROCALTROL) 0.25 MCG capsule Take 7 capsules (1.75 mcg total) by mouth every dialysis (3 times per week on TTS).    Marland Kitchen epoetin alfa (EPOGEN,PROCRIT) 16109 UNIT/ML injection Inject 1 mL (20,000 Units total) into the vein Every Tuesday,Thursday,and Saturday with dialysis. 1 mL   . hydrocortisone (ANUSOL-HC) 2.5 % rectal cream Place rectally 2 (two) times daily. (Patient not taking: Reported on 12/29/2018) 30 g 0    Allergies as of 12/29/2018 - Review Complete 11/18/2018  Allergen Reaction Noted  . Penicillins Hives and Other (See Comments) 09/30/2014  . Gabapentin Itching and Swelling  02/09/2013  . Lyrica [pregabalin] Swelling and Other (See Comments) 09/30/2014  . Saphris [asenapine] Swelling and Other (See Comments) 09/30/2014  . Tramadol Swelling and Other (See Comments) 09/30/2014  . Adhesive [tape] Itching 09/09/2017  . Latex Itching 09/09/2017  . Other Other (See Comments) 05/01/2015    Family History  Problem Relation Age of Onset  . Hypertension Mother   . Diabetes Mother   . Hypertension Father   . Diabetes Father     Social History   Socioeconomic History  . Marital status: Single    Spouse name: Not on file  . Number of children: 3  . Years of education: 64  . Highest education level: 12th grade  Occupational History  . Occupation: disabled  Social Needs  . Financial resource strain: Not on file  . Food insecurity:    Worry: Not on file    Inability: Not on file  . Transportation needs:    Medical: Not on file    Non-medical: Not on file  Tobacco Use  . Smoking status: Current Some Day Smoker    Packs/day: 1.00    Years: 36.00    Pack years: 36.00    Types: Cigarettes  . Smokeless tobacco: Never Used  . Tobacco comment: 5 cigs in one setting  Substance and Sexual Activity  . Alcohol use: Yes    Alcohol/week: 3.0 standard drinks    Types: 3 Standard drinks or equivalent per week    Frequency: Never    Comment: last use about 2 weeks ago  . Drug use: Not Currently    Types: "Crack" cocaine, Cocaine    Comment: last use >2 years ago  . Sexual activity: Not Currently    Birth control/protection: Abstinence  Lifestyle  . Physical activity:    Days per week: Not on file    Minutes per session: Not on file  . Stress: Not on file  Relationships  . Social connections:    Talks on phone: Not on file    Gets together: Not on file    Attends religious service: Not on file    Active member of club or organization: Not on file    Attends meetings of clubs or organizations: Not on file    Relationship status: Not on file  . Intimate  partner violence:    Fear of current or ex partner: Not on file    Emotionally abused: Not on file    Physically abused: Not on file    Forced sexual activity: Not on file  Other Topics Concern  . Not on file  Social History Narrative   ** Merged History Encounter **   Single, lives with son in a one story home. Unable to exercise. Avoids caffeine. 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        Review of Systems: All systems reviewed and negative except  where noted in HPI.  Physical Exam: Vital signs in last 24 hours: Temp:  [97.8 F (36.6 C)-98.1 F (36.7 C)] 98.1 F (36.7 C) (05/26 1134) Pulse Rate:  [80-84] 80 (05/26 1134) Resp:  [9-20] 20 (05/26 1134) BP: (134-167)/(73-80) 147/73 (05/26 1134) SpO2:  [97 %-100 %] 100 % (05/26 1100) Weight:  [74.8 kg] 74.8 kg (05/26 0756)   General:   Alert, well-developed, female in NAD Psych:  Pleasant, cooperative. Normal mood and affect. Eyes:  Pupils equal, sclera clear, no icterus.   Conjunctiva pink. Ears:  Normal auditory acuity. Nose:  No deformity, discharge,  or lesions. Neck:  Supple; no masses Lungs:  Clear throughout to auscultation.   No wheezes, crackles, or rhonchi.  Heart:  Regular rate and rhythm; no murmurs, no lower extremity edema Abdomen:  Soft, non-distended, nontender, BS active, no palp mass    Rectal:  Deferred  Msk:  Symmetrical without gross deformities. . Neurologic:  Alert and  oriented x4;  grossly normal neurologically. Skin:  Intact without significant lesions or rashes.   Intake/Output from previous day: No intake/output data recorded. Intake/Output this shift: No intake/output data recorded.  Lab Results: Recent Labs    12/29/18 0823  WBC 2.7*  HGB 6.4*  HCT 19.7*  PLT 93*   BMET Recent Labs    12/29/18 0823  NA 133*  K 6.0*  CL 93*  CO2 19*  GLUCOSE 186*  BUN 151*  CREATININE 9.60*  CALCIUM 8.6*   LFT Recent Labs    12/29/18 0823   PROT 6.5  ALBUMIN 3.2*  AST 58*  ALT 56*  ALKPHOS 130*  BILITOT 0.8   PT/INR Recent Labs    12/29/18 0823  LABPROT 16.2*  INR 1.3*   Hepatitis Panel No results for input(s): HEPBSAG, HCVAB, HEPAIGM, HEPBIGM in the last 72 hours.    Studies/Results: No results found.   Tye Savoy, NP-C @  12/29/2018, 12:27 PM  Labs GI ATTENDING  History, laboratories, x-rays, prior endoscopy reports reviewed.  Patient seen and examined.  Currently in dialysis.  She has chronic anemia.  Actually, pancytopenia.  Now with recurrent GI bleeding.  Negative previous work-up.  I am suspicious about possible small bowel AVMs.  Agree with CTA of the neck step for acute bleeding.  Without acute bleeding would consider push enteroscopy as her BUN seems disproportionately elevated despite her known renal failure.  Patient is high risk for endoscopic procedures.  We will continue to follow  [name]. Geri Seminole., M.D. [facility] Division of Gastroenterology

## 2018-12-29 NOTE — ED Notes (Addendum)
ED TO INPATIENT HANDOFF REPORT  ED Nurse Name and Phone #: Angellica Maddison, [phone]  S Name/Age/Gender [name] 53 y.o. female Room/Bed: 028C/028C  Code Status   Code Status: Prior  Home/SNF/Other Home Patient oriented to: self, place, time and situation Is this baseline? Yes   Triage Complete: Triage complete  Chief Complaint GI Bleed; Weakness  Triage Note Pt here for generlized weakness and bleeding from rectum since yesterday afternoon. Hx GI bleed. T, Th, S dialysis pt, missed dialysis Saturday. Denies pain.    Allergies Allergies  Allergen Reactions  . Penicillins Hives and Other (See Comments)    Has patient had a PCN reaction causing immediate rash, facial/tongue/throat swelling, SOB or lightheadedness with hypotension: Yes Has patient had a PCN reaction causing severe rash involving mucus membranes or skin necrosis: No Has patient had a PCN reaction that required hospitalization No Has patient had a PCN reaction occurring within the last 10 years: No If all of the above answers are "NO", then may proceed with Cephalosporin use. Pt tolerates cefepime and ceftriaxone  . Gabapentin Itching and Swelling  . Lyrica [Pregabalin] Swelling and Other (See Comments)    Facial and leg swelling  . Saphris [Asenapine] Swelling and Other (See Comments)    Facial swelling  . Tramadol Swelling and Other (See Comments)    Leg swelling  . Adhesive [Tape] Itching    SEVERE ITCHING  . Latex Itching    SEVERE ITCHING  . Other Other (See Comments)    NO BLOOD PRODUCTS; PATIENT IS A JEHOVAH'S WITNESS    Level of Care/Admitting Diagnosis ED Disposition    ED Disposition Condition Comment   Admit  Hospital Area: [place] [[id number]]  Level of Care: Telemetry Medical [104]  I expect the patient will be discharged within 24 hours: No (not a candidate for 5C-Observation unit)  Covid Evaluation: Screening Protocol (No Symptoms)  Diagnosis: Rectal bleeding [259563]   Admitting Physician: Norval Morton [[id number]]  Attending Physician: Norval Morton [[id number]]  PT Class (Do Not Modify): Observation [104]  PT Acc Code (Do Not Modify): Observation [10022]       B Medical/Surgery History Past Medical History:  Diagnosis Date  . Anemia   . Anxiety   . Bipolar disorder, unspecified ([place]) 02/06/2014  . Chronic combined systolic and diastolic CHF (congestive heart failure) (HCC)    EF 40% by echo and 48% by stress test  . Chronic pain syndrome   . Depression   . ESRD on hemodialysis (HCC)    TTS  . GERD (gastroesophageal reflux disease)   . Glaucoma   . Headache    sinus headaches  . Hepatitis C   . High cholesterol   . History of hiatal hernia   . History of vitamin D deficiency 06/20/2015  . Hypertension   . MRSA infection ?2014   "on the back of my head; spread throughout my bloodstream"  . Neuropathy   . Refusal of blood transfusions as patient is Jehovah's Witness   . Schizoaffective disorder, unspecified condition 02/08/2014  . Seizure [facility]) dx'd 2014   "don't know what kind; last one was ~ 04/2014"  . TIA (transient ischemic attack) 2010  . Type II diabetes mellitus ([place])    INSULIN DEPENDENT   Past Surgical History:  Procedure Laterality Date  . ABDOMINAL HYSTERECTOMY  2014  . ABDOMINAL HYSTERECTOMY  2010  . AMPUTATION Left 05/21/2013   Procedure: Left Midfoot AMPUTATION;  Surgeon: Newt Minion, MD;  Location: [place];  Service: Orthopedics;  Laterality: Left;  Left Midfoot Amputation  . AV FISTULA PLACEMENT Left 05/04/2015   Procedure: ARTERIOVENOUS (AV) GRAFT INSERTION;  Surgeon: Angelia Mould, MD;  Location: [place];  Service: Vascular;  Laterality: Left;  . BIOPSY  11/18/2018   Procedure: BIOPSY;  Surgeon: Jerene Bears, MD;  Location: [facility] ENDOSCOPY;  Service: Gastroenterology;;  . COLONOSCOPY WITH PROPOFOL N/A 06/22/2013   Procedure: COLONOSCOPY WITH PROPOFOL;  Surgeon: Jeryl Columbia, MD;  Location: [facility] ENDOSCOPY;  Service:  Endoscopy;  Laterality: N/A;  . COLONOSCOPY WITH PROPOFOL N/A 11/18/2018   Procedure: COLONOSCOPY WITH PROPOFOL;  Surgeon: Jerene Bears, MD;  Location: [place];  Service: Gastroenterology;  Laterality: N/A;  . ESOPHAGOGASTRODUODENOSCOPY N/A 05/11/2015   Procedure: ESOPHAGOGASTRODUODENOSCOPY (EGD);  Surgeon: Laurence Spates, MD;  Location: [facility] ENDOSCOPY;  Service: Endoscopy;  Laterality: N/A;  . ESOPHAGOGASTRODUODENOSCOPY (EGD) WITH PROPOFOL N/A 11/18/2018   Procedure: ESOPHAGOGASTRODUODENOSCOPY (EGD) WITH PROPOFOL;  Surgeon: Jerene Bears, MD;  Location: [facility] ENDOSCOPY;  Service: Gastroenterology;  Laterality: N/A;  . EYE SURGERY Bilateral 2010   Lasik  . FLEXIBLE SIGMOIDOSCOPY N/A 01/17/2017   Procedure: FLEXIBLE SIGMOIDOSCOPY w/ hemorrhoid banding possible;  Surgeon: Gatha Mayer, MD;  Location: [facility] END[facility]COPY;  Service: Endoscopy;  Laterality: N/A;  . FOOT AMPUTATION Left    due diabetic neuropathy, could not feel ulcer on bottom of foot  . POLYPECTOMY  11/18/2018   Procedure: POLYPECTOMY;  Surgeon: Jerene Bears, MD;  Location: [facility] ENDOSCOPY;  Service: Gastroenterology;;  . REFRACTIVE SURGERY Bilateral 2013  . TONSILLECTOMY  1970's  . TUBAL LIGATION  2000     A IV Location/Drains/Wounds Patient Lines/Drains/Airways Status   Active Line/Drains/Airways    Name:   Placement date:   Placement time:   Site:   Days:   Peripheral IV 12/29/18 Right Antecubital   12/29/18    0830    Antecubital   less than 1   Fistula / Graft Left Forearm Arteriovenous vein graft   05/04/15    0819    Forearm   1335   Wound / Incision (Open or Dehisced) 10/05/18 Non-pressure wound Foot Right;Left several scabbed wounds to right foot between 1st & 2nd toes; on 4th & 5th toes   10/05/18    0500    Foot   85   Wound / Incision (Open or Dehisced) 10/05/18 Incision - Open Foot Anterior;Left;Distal amputation to top of left foot, including toes   10/05/18    0500    Foot   85          Intake/Output Last 24  hours No intake or output data in the 24 hours ending 12/29/18 1201  Labs/Imaging Results for orders placed or performed during the hospital encounter of 12/29/18 (from the past 48 hour(s))  CBC with Differential     Status: Abnormal   Collection Time: 12/29/18  8:23 AM  Result Value Ref Range   WBC 2.7 (L) 4.0 - 10.5 K/uL   RBC 1.87 (L) 3.87 - 5.11 MIL/uL   Hemoglobin 6.4 (LL) 12.0 - 15.0 g/dL    Comment: REPEATED TO VERIFY THIS CRITICAL RESULT HAS VERIFIED AND BEEN CALLED TO Kambre Messner,RN BY ZELDA BEECH ON 05 26 2020 AT 0900, AND HAS BEEN READ BACK.     HCT 19.7 (L) 36.0 - 46.0 %   MCV 105.3 (H) 80.0 - 100.0 fL   MCH 34.2 (H) 26.0 - 34.0 pg   MCHC 32.5 30.0 - 36.0 g/dL  RDW 15.2 11.5 - 15.5 %   Platelets 93 (L) 150 - 400 K/uL    Comment: REPEATED TO VERIFY Immature Platelet Fraction may be clinically indicated, consider ordering this additional test [id number] CONSISTENT WITH PREVIOUS RESULT    nRBC 0.0 0.0 - 0.2 %   Neutrophils Relative % 60 %   Neutro Abs 1.7 1.7 - 7.7 K/uL   Lymphocytes Relative 27 %   Lymphs Abs 0.7 0.7 - 4.0 K/uL   Monocytes Relative 9 %   Monocytes Absolute 0.2 0.1 - 1.0 K/uL   Eosinophils Relative 3 %   Eosinophils Absolute 0.1 0.0 - 0.5 K/uL   Basophils Relative 1 %   Basophils Absolute 0.0 0.0 - 0.1 K/uL   Immature Granulocytes 0 %   Abs Immature Granulocytes 0.01 0.00 - 0.07 K/uL    Comment: Performed at [facility] [address] [place], [place] [address]  Comprehensive metabolic panel     Status: Abnormal   Collection Time: 12/29/18  8:23 AM  Result Value Ref Range   Sodium 133 (L) 135 - 145 mmol/L   Potassium 6.0 (H) 3.5 - 5.1 mmol/L   Chloride 93 (L) 98 - 111 mmol/L   CO2 19 (L) 22 - 32 mmol/L   Glucose, Bld 186 (H) 70 - 99 mg/dL   BUN 151 (H) 6 - 20 mg/dL   Creatinine, Ser 9.60 (H) 0.44 - 1.00 mg/dL   Calcium 8.6 (L) 8.9 - 10.3 mg/dL   Total Protein 6.5 6.5 - 8.1 g/dL   Albumin 3.2 (L) 3.5 - 5.0 g/dL   AST 58 (H) 15 - 41  U/L   ALT 56 (H) 0 - 44 U/L   Alkaline Phosphatase 130 (H) 38 - 126 U/L   Total Bilirubin 0.8 0.3 - 1.2 mg/dL   GFR calc non Af Amer 4 (L) >60 mL/min   GFR calc Af Amer 5 (L) >60 mL/min   Anion gap 21 (H) 5 - 15    Comment: REPEATED TO VERIFY Performed at [facility], [address], [place], [place] [address]   Protime-INR     Status: Abnormal   Collection Time: 12/29/18  8:23 AM  Result Value Ref Range   Prothrombin Time 16.2 (H) 11.4 - 15.2 seconds   INR 1.3 (H) 0.8 - 1.2    Comment: (NOTE) INR goal varies based on device and disease states. Performed at [facility], [place] [address], [place], [place] [address]   POC occult blood, ED Provider will collect     Status: Abnormal   Collection Time: 12/29/18  9:25 AM  Result Value Ref Range   Fecal Occult Bld POSITIVE (A) NEGATIVE  Type and screen [place]     Status: None (Preliminary result)   Collection Time: 12/29/18  9:33 AM  Result Value Ref Range   ABO/RH(D) O POS    Antibody Screen NEG    Sample Expiration 01/01/2019,2359    Unit Number [id number]    Blood Component Type RBC, LR IRR    Unit division 00    Status of Unit ISSUED    Transfusion Status OK TO TRANSFUSE    Crossmatch Result      Compatible Performed at [facility], [place] [address], [place], [place] [address]    Unit Number [id number]    Blood Component Type RED CELLS,LR    Unit division 00    Status of Unit ALLOCATED    Transfusion  Status OK TO TRANSFUSE    Crossmatch Result Compatible   Prepare RBC     Status: None   Collection Time: 12/29/18  9:33 AM  Result Value Ref Range   Order Confirmation      ORDER PROCESSED BY BLOOD BANK Performed at [facility], [place] [address], [place],  [address]   CBG monitoring, ED     Status: Abnormal   Collection Time: 12/29/18  9:56 AM  Result Value Ref Range   Glucose-Capillary 183 (H) 70 - 99 mg/dL  CBG monitoring, ED     Status: Abnormal   Collection  Time: 12/29/18 11:52 AM  Result Value Ref Range   Glucose-Capillary 186 (H) 70 - 99 mg/dL   No results found.  Pending Labs Unresulted Labs (From admission, onward)    Start     Ordered   12/29/18 1102  Glucose, random  (Moderate hyperkalemia:  Potassium  6.5 to 7 mEq/L without ECG changes)  Once,   R    Comments:  1 hour after insulin administered.    12/29/18 0946   12/29/18 1052  SARS Coronavirus 2 (CEPHEID - Performed in [place] hospital lab), Hosp Order  (Asymptomatic Patients Labs)  Once,   R    Question:  Rule Out  Answer:  Yes   12/29/18 1051          Vitals/Pain Today's Vitals   12/29/18 1100 12/29/18 1119 12/29/18 1126 12/29/18 1134  BP: (!) 154/77 (!) 154/76  (!) 147/73  Pulse: 81 82  80  Resp: 14 16  20   Temp:  97.8 F (36.6 C)  98.1 F (36.7 C)  TempSrc:  Oral  Oral  SpO2: 100%     Weight:      Height:      PainSc:   0-No pain     Isolation Precautions No active isolations  Medications Medications  0.9 %  sodium chloride infusion (Manually program via Guardrails IV Fluids) (has no administration in time range)  Chlorhexidine Gluconate Cloth 2 % PADS 6 each (has no administration in time range)  albuterol (PROVENTIL) (2.5 MG/3ML) 0.083% nebulizer solution 10 mg (10 mg Nebulization Given 12/29/18 1013)  insulin aspart (novoLOG) injection 5 Units (5 Units Intravenous Given 12/29/18 1009)    And  dextrose 50 % solution 50 mL (50 mLs Intravenous Given 12/29/18 1009)  pantoprazole (PROTONIX) injection 40 mg (40 mg Intravenous Given 12/29/18 1022)    Mobility walks Low fall risk   Focused Assessments Cardiac Assessment Handoff:    Lab Results  Component Value Date   CKTOTAL 55 02/25/2017   CKMB 2.5 02/13/2008   TROPONINI 0.03 (HH) 10/05/2018   Lab Results  Component Value Date   DDIMER 0.22 07/12/2011   Does the Patient currently have chest pain? No     R Recommendations: See Admitting Provider Note  Report given to: Lanelle Bal, 28M  RN  Additional Notes:

## 2018-12-29 NOTE — ED Triage Notes (Signed)
Pt here for generlized weakness and bleeding from rectum since yesterday afternoon. Hx GI bleed. T, Th, S dialysis pt, missed dialysis Saturday. Denies pain.

## 2018-12-29 NOTE — Progress Notes (Signed)
Patient refused bed alarm. Pt alert and oriented x4,high fall risk. Patient verbalized understanding of  the reason for the alarm. Patient understands to call before getting up. Call [place] within reach. Rayola Everhart, Wonda Cheng, Therapist, sports

## 2018-12-29 NOTE — H&P (Addendum)
History and Physical    [name] NKN:[id number] DOB: 1965/09/14 DOA: 12/29/2018  Referring MD/NP/PA: Arlean Hopping, PA-C PCP: [name], [name]  [name] coming from: home  Chief Complaint: Rectal bleed  I have personally briefly reviewed [name]'s old medical records in [place]   HPI: [name] is a 53 y.o. female with medical history significant of ESRD on HD, Jehovah's Witness, combined CHF, DM type II, hepatitis C, bipolar disorder, PVD s/p left TMA, anxiety, anemia, and GI bleed; who presents with complaints of rectal bleeding since last night.  She reports having 8 episodes of with bleeding dark red blood since last night.  Notes that she chronically has pain, but did not associate any pain with bleeding episodes per se.  She had been at a nursing facility until 5/22 when she moved back home.  They did not notified transportation and therefore she missed her hemodialysis session 5/23.  She noted having some shortness of breath that she related to the missed hemodialysis session.  Other associated symptoms include complaints of constipation, itching, and sores all over her body.  She had been utilizing the mupirocin cream without relief.  At the nursing facility she states her roommate had MRSA and thinks that is what she has as well.  She has tried Benadryl and hydroxyzine without relief of symptoms.    During last hospitalization in April [name] presented with complaints of GI bleed seen by [facility].  EGD revealed gastritis/duodenitis needing Protonix daily.  Colonoscopy revealed hemorrhoids in the perianal exam and multiple polyps removed.   ED Course: Upon admission into the emergency department [name] was noted to be afebrile, respirations 9-21, blood pressure elevated up to 169/83, and all other vitals maintained.  Labs significant for WBC 2.1, hemoglobin 6.4, platelets 93, potassium 6, BUN 151, creatinine 9.6, and anion gap 21.  [name] was typed and screened and  ordered to be transfused 2 units of packed red blood cells.  [facility] consulted as well as nephrology for need of hemodialysis as she missed her last session.    Review of Systems  Constitutional: Negative for malaise/fatigue.  HENT: Positive for ear pain. Negative for congestion.   Eyes: Negative for photophobia and pain.  Respiratory: Positive for shortness of breath.   Cardiovascular: Negative for chest pain and leg swelling.  Gastrointestinal: Positive for blood in stool and constipation. Negative for vomiting.  Genitourinary: Negative for dysuria and hematuria.  Musculoskeletal: Positive for joint pain and myalgias.  Skin: Positive for itching and rash.  Neurological: Negative for focal weakness and loss of consciousness.  Psychiatric/Behavioral: Negative for depression and memory loss.    Past Medical History:  Diagnosis Date   Anemia    Anxiety    Bipolar disorder, unspecified ([place]) 02/06/2014   Chronic combined systolic and diastolic CHF (congestive heart failure) (HCC)    EF 40% by echo and 48% by stress test   Chronic pain syndrome    Depression    ESRD on hemodialysis (HCC)    TTS   GERD (gastroesophageal reflux disease)    Glaucoma    Headache    sinus headaches   Hepatitis C    High cholesterol    History of hiatal hernia    History of vitamin D deficiency 06/20/2015   Hypertension    MRSA infection ?2014   "on the back of my head; spread throughout my bloodstream"   Neuropathy    Refusal of blood transfusions as [name] is [employer]  Witness    Schizoaffective disorder, unspecified condition 02/08/2014   Seizure ([place]) dx'd 2014   "don't know what kind; last one was ~ 04/2014"   TIA (transient ischemic attack) 2010   Type II diabetes mellitus ([place])    INSULIN DEPENDENT    Past Surgical History:  Procedure Laterality Date   ABDOMINAL HYSTERECTOMY  2014   ABDOMINAL HYSTERECTOMY  2010   AMPUTATION Left 05/21/2013   Procedure: Left  Midfoot AMPUTATION;  Surgeon: Newt Minion, MD;  Location: [facility];  Service: Orthopedics;  Laterality: Left;  Left Midfoot Amputation   AV FISTULA PLACEMENT Left 05/04/2015   Procedure: ARTERIOVENOUS (AV) GRAFT INSERTION;  Surgeon: Angelia Mould, MD;  Location: [facility];  Service: Vascular;  Laterality: Left;   BIOPSY  11/18/2018   Procedure: BIOPSY;  Surgeon: Jerene Bears, MD;  Location: [facility];  Service: Gastroenterology;;   COLONOSCOPY WITH PROPOFOL N/A 06/22/2013   Procedure: COLONOSCOPY WITH PROPOFOL;  Surgeon: Jeryl Columbia, MD;  Location: [facility] ENDOSCOPY;  Service: Endoscopy;  Laterality: N/A;   COLONOSCOPY WITH PROPOFOL N/A 11/18/2018   Procedure: COLONOSCOPY WITH PROPOFOL;  Surgeon: Jerene Bears, MD;  Location: [facility];  Service: Gastroenterology;  Laterality: N/A;   ESOPHAGOGASTRODUODENOSCOPY N/A 05/11/2015   Procedure: ESOPHAGOGASTRODUODENOSCOPY (EGD);  Surgeon: Laurence Spates, MD;  Location: [facility] ENDOSCOPY;  Service: Endoscopy;  Laterality: N/A;   ESOPHAGOGASTRODUODENOSCOPY (EGD) WITH PROPOFOL N/A 11/18/2018   Procedure: ESOPHAGOGASTRODUODENOSCOPY (EGD) WITH PROPOFOL;  Surgeon: Jerene Bears, MD;  Location: [facility] ENDOSCOPY;  Service: Gastroenterology;  Laterality: N/A;   EYE SURGERY Bilateral 2010   Lasik   FLEXIBLE SIGMOIDOSCOPY N/A 01/17/2017   Procedure: FLEXIBLE SIGMOIDOSCOPY w/ hemorrhoid banding possible;  Surgeon: Gatha Mayer, MD;  Location: [facility] ENDOSCOPY;  Service: Endoscopy;  Laterality: N/A;   FOOT AMPUTATION Left    due diabetic neuropathy, could not feel ulcer on bottom of foot   POLYPECTOMY  11/18/2018   Procedure: POLYPECTOMY;  Surgeon: Jerene Bears, MD;  Location[facility] ENDOSCOPY;  Service: Gastroenterology;;   REFRACTIVE SURGERY Bilateral 2013   TONSILLECTOMY  1970's   TUBAL LIGATION  2000     reports that she has been smoking cigarettes. She has a 36.00 pack-year smoking history. She has never used smokeless tobacco. She reports current alcohol  use of about 3.0 standard drinks of alcohol per week. She reports previous drug use. Drugs: "Crack" cocaine and Cocaine.  Allergies  Allergen Reactions   Penicillins Hives and Other (See Comments)    Has [name] had a PCN reaction causing immediate rash, facial/tongue/throat swelling, SOB or lightheadedness with hypotension: Yes Has [name] had a PCN reaction causing severe rash involving mucus membranes or skin necrosis: No Has [name] had a PCN reaction that required hospitalization No Has [name] had a PCN reaction occurring within the last 10 years: No If all of the above answers are "NO", then may proceed with Cephalosporin use. Pt tolerates cefepime and ceftriaxone   Gabapentin Itching and Swelling   Lyrica [Pregabalin] Swelling and Other (See Comments)    Facial and leg swelling   Saphris [Asenapine] Swelling and Other (See Comments)    Facial swelling   Tramadol Swelling and Other (See Comments)    Leg swelling   Adhesive [Tape] Itching    SEVERE ITCHING   Latex Itching    SEVERE ITCHING   Other Other (See Comments)    NO BLOOD PRODUCTS; [name] IS A JEHOVAH'S WITNESS    Family History  Problem Relation Age of Onset  Hypertension Mother    Diabetes Mother    Hypertension Father    Diabetes Father     Prior to Admission medications   Medication Sig Start Date End Date Taking? Authorizing Provider  albuterol (PROVENTIL HFA;VENTOLIN HFA) 108 (90 Base) MCG/ACT inhaler Inhale 2 puffs into the lungs every 4 (four) hours as needed for wheezing or shortness of breath. 10/15/16  Yes Horton, Barbette Hair, MD  allopurinol (ZYLOPRIM) 100 MG tablet Take 1 tablet (100 mg total) by mouth every Tuesday, Thursday, and Saturday at 6 PM. 01/25/17  Yes Nita Sells, MD  ALPRAZolam Duanne Moron) 0.5 MG tablet Take 1 tablet (0.5 mg total) by mouth every 8 (eight) hours as needed for anxiety. 11/20/18  Yes Fuller Plan A, MD  atorvastatin (LIPITOR) 40 MG tablet Take 1 tablet (40  mg total) by mouth at bedtime. 09/02/16  Yes Hildred Priest, MD  bisacodyl (DULCOLAX) 10 MG suppository Place 10 mg rectally daily as needed (for constipation).   Yes [provider]  calcium acetate (PHOSLO) 667 MG capsule Take 2 capsules (1,334 mg total) by mouth 3 (three) times daily with meals. 09/02/16  Yes Hildred Priest, MD  diphenhydrAMINE (BENADRYL) 25 mg capsule Take 25 mg by mouth every 6 (six) hours as needed for itching.    Yes [provider]  docusate sodium (COLACE) 100 MG capsule Take 100 mg by mouth 2 (two) times daily.   Yes [provider]  DULoxetine (CYMBALTA) 30 MG capsule Take 30 mg by mouth daily. Take with 60 for a total of 90   Yes [provider]  DULoxetine (CYMBALTA) 60 MG capsule Take 60 mg by mouth daily. Take one capsule with 30mg  to equal 90mg .   Yes [provider]  Ferrous Sulfate (SLOW FE) 142 (45 Fe) MG TBCR Take 1 tablet by mouth daily. 07/19/17  Yes Mariel Aloe, MD  folic acid (FOLVITE) 1 MG tablet Take 1 tablet (1 mg total) by mouth daily. 10/10/18  Yes Ghimire, Henreitta Leber, MD  insulin aspart (NOVOLOG) 100 UNIT/ML injection 0-9 Units, Subcutaneous, 3 times daily with meals CBG < 70: implement hypoglycemia protocol CBG 70 - 120: 0 units CBG 121 - 150: 1 unit CBG 151 - 200: 2 units CBG 201 - 250: 3 units CBG 251 - 300: 5 units CBG 301 - 350: 7 units CBG 351 - 400: 9 units CBG > 400: call MD 10/10/18  Yes Ghimire, Henreitta Leber, MD  Insulin Detemir (LEVEMIR FLEXTOUCH) 100 UNIT/ML Pen Inject 12 Units into the skin at bedtime. 10/10/18  Yes Ghimire, Henreitta Leber, MD  labetalol (NORMODYNE) 200 MG tablet Take 1 tablet (200 mg total) by mouth 2 (two) times daily. 11/20/18  Yes Fuller Plan A, MD  lidocaine-prilocaine (EMLA) cream Apply 1 application topically See admin instructions. On dialysis days 12/23/18  Yes [provider]  methocarbamol (ROBAXIN) 500 MG tablet Take 500 mg by mouth 3 (three)  times daily as needed for muscle spasms.   Yes [provider]  Multiple Vitamins-Minerals (CEROVITE SENIOR PO) Take 1 tablet by mouth at bedtime.   Yes [provider]  multivitamin (RENA-VIT) TABS tablet Take 1 tablet by mouth at bedtime. 09/02/16  Yes Hildred Priest, MD  mupirocin ointment (BACTROBAN) 2 % Apply 1 application topically as needed (on arm).  12/01/18  Yes [provider]  ondansetron (ZOFRAN) 4 MG tablet Take 4 mg by mouth every 8 (eight) hours as needed for nausea or vomiting.   Yes [provider]  Oxycodone HCl 10 MG TABS Take 1 tablet (10 mg total) by mouth every 6 (six) hours as needed (for pain). 11/20/18  Yes Fuller Plan A, MD  pantoprazole (PROTONIX) 40 MG tablet Take 1 tablet (40 mg total) by mouth daily at 6 (six) AM. 07/20/17  Yes Mariel Aloe, MD  saccharomyces boulardii (FLORASTOR) 250 MG capsule Take 250 mg by mouth daily.   Yes [provider]  senna (SENOKOT) 8.6 MG tablet Take 2 tablets by mouth daily.   Yes [provider]  traZODone (DESYREL) 50 MG tablet Take 0.5 tablets (25 mg total) by mouth at bedtime. [name] taking differently: Take 50 mg by mouth at bedtime.  07/19/17  Yes Mariel Aloe, MD  amLODipine (NORVASC) 10 MG tablet Take 1 tablet (10 mg total) by mouth at bedtime. [name] not taking: Reported on 12/29/2018 09/02/16   Hildred Priest, MD  calcitRIOL (ROCALTROL) 0.25 MCG capsule Take 7 capsules (1.75 mcg total) by mouth every dialysis (3 times per week on TTS). 10/10/18   Ghimire, Henreitta Leber, MD  epoetin alfa (EPOGEN,PROCRIT) 37106 UNIT/ML injection Inject 1 mL (20,000 Units total) into the vein Every Tuesday,Thursday,and Saturday with dialysis. 10/10/18   Ghimire, Henreitta Leber, MD  hydrocortisone (ANUSOL-HC) 2.5 % rectal cream Place rectally 2 (two) times daily. [name] not taking: Reported on 12/29/2018 01/24/17   Nita Sells, MD    Physical Exam:  Constitutional:  Older female who appears to be in no acute distress at this time. Vitals:   12/29/18 1134 12/29/18 1145 12/29/18 1200 12/29/18 1215  BP: (!) 147/73 131/70 (!) 142/73 (!) 159/76  Pulse: 80 79 80 81  Resp: 20 18 14 20   Temp: 98.1 F (36.7 C)     TempSrc: Oral     SpO2:  96% 96% 97%  Weight:      Height:       Eyes: PERRL, lids and conjunctivae normal ENMT: Mucous membranes are moist.  Left ear with excoriations present.  Otoscope on floor not currently working at this time for further evaluation. Neck: normal, supple, no masses, no thyromegaly Respiratory: clear to auscultation bilaterally, no wheezing, no crackles. Normal respiratory effort. No accessory muscle use.  Cardiovascular: Regular rate and rhythm, no murmurs / rubs / gallops. No extremity edema. 2+ pedal pulses. No carotid bruits.  Abdomen: no tenderness, no masses palpated. No hepatosplenomegaly. Bowel sounds positive.  Musculoskeletal: no clubbing / cyanosis. No joint deformity upper and lower extremities. Good ROM, no contractures. Normal muscle tone.  Skin: Multiple sores with excoriation noted of the upper and lower extremities.  Ulceration in between the right first and second digit and does not appear to be draining.  Excoriations Neurologic: CN 2-12 grossly intact. Sensation intact, DTR normal. Strength 5/5 in all 4.  Psychiatric: Normal judgment and insight. Alert and oriented x 3. Normal mood.     Labs on Admission: I have personally reviewed following labs and imaging studies  CBC: Recent Labs  Lab 12/29/18 0823  WBC 2.7*  NEUTROABS 1.7  HGB 6.4*  HCT 19.7*  MCV 105.3*  PLT 93*   Basic Metabolic Panel: Recent Labs  Lab 12/29/18 0823  NA 133*  K 6.0*  CL 93*  CO2 19*  GLUCOSE 186*  BUN 151*  CREATININE 9.60*  CALCIUM 8.6*   GFR: Estimated Creatinine Clearance: 6.8 mL/min (A) (by C-G formula based on SCr of 9.6 mg/dL (H)). Liver Function Tests: Recent Labs  Lab 12/29/18 0823  AST 58*  ALT  56*  ALKPHOS 130*  BILITOT 0.8  PROT 6.5  ALBUMIN 3.2*   No results for input(s): LIPASE, AMYLASE in the last 168 hours. No results for input(s): AMMONIA in the last 168 hours. Coagulation Profile: Recent Labs  Lab 12/29/18 0823  INR 1.3*   Cardiac Enzymes: No results for input(s): CKTOTAL, CKMB, CKMBINDEX, TROPONINI in the last 168 hours. BNP (last 3 results) No results for input(s): PROBNP in the last 8760 hours. HbA1C: No results for input(s): HGBA1C in the last 72 hours. CBG: Recent Labs  Lab 12/29/18 0956 12/29/18 1152  GLUCAP 183* 186*   Lipid Profile: No results for input(s): CHOL, HDL, LDLCALC, TRIG, CHOLHDL, LDLDIRECT in the last 72 hours. Thyroid Function Tests: No results for input(s): TSH, T4TOTAL, FREET4, T3FREE, THYROIDAB in the last 72 hours. Anemia Panel: No results for input(s): VITAMINB12, FOLATE, FERRITIN, TIBC, IRON, RETICCTPCT in the last 72 hours. Urine analysis:    Component Value Date/Time   COLORURINE YELLOW 02/25/2017 1224   APPEARANCEUR HAZY (A) 02/25/2017 1224   LABSPEC 1.013 02/25/2017 1224   PHURINE 8.0 02/25/2017 1224   GLUCOSEU >=500 (A) 02/25/2017 1224   HGBUR SMALL (A) 02/25/2017 1224   BILIRUBINUR NEGATIVE 02/25/2017 1224   BILIRUBINUR neg 12/13/2014 1127   KETONESUR NEGATIVE 02/25/2017 1224   PROTEINUR >=300 (A) 02/25/2017 1224   UROBILINOGEN 0.2 05/01/2015 1252   NITRITE NEGATIVE 02/25/2017 1224   LEUKOCYTESUR NEGATIVE 02/25/2017 1224   Sepsis Labs: Recent Results (from the past 240 hour(s))  SARS Coronavirus 2 (CEPHEID - Performed in [place] hospital lab), Hosp Order     Status: None   Collection Time: 12/29/18 10:53 AM  Result Value Ref Range Status   SARS Coronavirus 2 NEGATIVE NEGATIVE Final    Comment: (NOTE) If result is NEGATIVE SARS-CoV-2 target nucleic acids are NOT DETECTED. The SARS-CoV-2 RNA is generally detectable in upper and lower  respiratory specimens during the acute phase of infection. The lowest    concentration of SARS-CoV-2 viral copies this assay can detect is 250  copies / mL. A negative result does not preclude SARS-CoV-2 infection  and should not be used as the sole basis for treatment or other  [name] management decisions.  A negative result may occur with  improper specimen collection / handling, submission of specimen other  than nasopharyngeal swab, presence of viral mutation(s) within the  areas targeted by this assay, and inadequate number of viral copies  (<250 copies / mL). A negative result must be combined with clinical  observations, [name] history, and epidemiological information. If result is POSITIVE SARS-CoV-2 target nucleic acids are DETECTED. The SARS-CoV-2 RNA is generally detectable in upper and lower  respiratory specimens dur ing the acute phase of infection.  Positive  results are indicative of active infection with SARS-CoV-2.  Clinical  correlation with [name] history and other diagnostic information is  necessary to determine [name] infection status.  Positive results do  not rule out bacterial infection or co-infection with other viruses. If result is PRESUMPTIVE POSTIVE SARS-CoV-2 nucleic acids MAY BE PRESENT.   A presumptive positive result was obtained on the submitted specimen  and confirmed on repeat testing.  While 2019 novel coronavirus  (SARS-CoV-2) nucleic acids may be present in the submitted sample  additional confirmatory testing may be necessary for epidemiological  and / or clinical management purposes  to differentiate between  SARS-CoV-2 and other Sarbecovirus currently known to infect humans.  If clinically indicated additional testing with an alternate test  methodology [phone]) is advised.  The SARS-CoV-2 RNA is generally  detectable in upper and lower respiratory sp ecimens during the acute  phase of infection. The expected result is Negative. Fact Sheet for Patients:  StrictlyIdeas.no Fact Sheet  for Healthcare Providers: BankingDealers.co.za This test is not yet approved or cleared by the [place] FDA and has been authorized for detection and/or diagnosis of SARS-CoV-2 by FDA under an Emergency Use Authorization (EUA).  This EUA will remain in effect (meaning this test can be used) for the duration of the COVID-19 declaration under Section 564(b)(1) of the Act, 21 U.S.C. section 360bbb-3(b)(1), unless the authorization is terminated or revoked sooner. Performed at [facility], [place] [address] [place], [place] [address]      Radiological Exams on Admission: No results found.  EKG: Independently reviewed.  Sinus rhythm at 79 bpm with prolonged QTc  508 and noted T wave abnormality.  Assessment/Plan Rectal bleeding, symptomatic anemia: Acute on chronic.  Hemoglobin down to 6.4 g/dL on admission.  Reports having several episodes of red blood per rectum as well as being constipated.  During last colonoscopy in March [name] noted to have hemorrhoids and polyps evaluated were removed.  [name] was given 1 dose of Protonix and ordered to be transfused 2 units of packed red blood cells.  She denies any further reports of bleeding since admission. -Admit to a telemetry bed -Monitor intake and output -Clear liquid diet for and restart renal and carb modified diet if no recurrence of bleeding -Trend hemoglobin -Senokot -Colace -[place] gastroenterology consulted, will follow for further recommendations  ESRD on HD: [name] normally dialyzes Tuesday, Thursday, and Saturday.  Reportedly missed her last session of hemodialysis on 5/23.  Potassium elevated at 6.  Dr. Jonnie Finner of nephrology consulted and will dialyze [name]. -HD per nephrology  Hyperkalemia: Acute.  Initial potassium noted to be elevated at 6 with signs of ST wave changes.  [name] was given insulin and calcium gluconate.  [name] taken for hemodialysis. -Continue to monitor  Pruritic rash,  diabetic ulcers: [name] noted to have multiple ulcers of her skin.  Has been trying mupirocin cream without relief. -Benadryl as needed -Continue mupirocin cream -Wound care consult  Prolonged QT interval: Acute.  Admission QTc 508. -Correct electrolyte abnormality -Recheck EKG in a.m. -Hold QT prolonging medications   Left ear pain: Unclear cause of ear pain and discomfort.  Unable to be assessed with otoscope.  Chronic combined CHF: Last EF 45 to 50% with grade 2 diastolic dysfunction.  [name] appears to be fluid overloaded at this time needing dialysis. -Continue to monitor  Thrombocytopenia: Acute on chronic.  Platelet count 93 which appears to be similar to previous. -Continue to monitor  Essential hypertension: Blood pressures mildly elevated -Continue labetalol  Diabetes mellitus type 2: Last hemoglobin A1c 5.9.  At home [name] normally takes Levemir 12 units nightlyand NovoLog 9 units with meals. -Hypoglycemic protocol -CBGs q. before meals with moderate SSI -Restart Levemir when tolerating meals  Anxiety/depression -Continue Cymbalta, Xanax as needed  Chronic pain -Continue oxycodone as needed  Hyperlipidemia -Continue atorvastatin  DVT prophylaxis: SCD Code Status:  Full Family Communication: No family present at bedside Disposition Plan: Likely discharge home in 1 to 2 days Consults called: [facility], renal Admission status: inpatient  Norval Morton MD [facility] Pager [phone]   If 7PM-7AM, please contact night-coverage [email or web address] Password [name]  12/29/2018, 12:33 PM

## 2018-12-29 NOTE — ED Notes (Signed)
This RN acting as Art therapist and asked pt if I could reach out to any family for her. Pt declined at this time.

## 2018-12-29 NOTE — Progress Notes (Signed)
NEW ADMISSION NOTE New Admission Note:   Arrival Method: From ED via stretcher  Mental Orientation: axox4 Telemetry: Box 21  Assessment: Completed Skin: see assessment  IV:20G RAC  Pain: Denies  Tubes: none Safety Measures: Safety Fall Prevention Plan has been  Discussed  Admission: Pending  [facility] Orientation: Patient has been orientated to the room, unit and staff.  Family: none at the bedside.  Pt taken to HD   Orders have been reviewed and implemented. Will continue to monitor the patient. Call light has been placed within reach and bed alarm has been activated.   Paulla Fore, RN

## 2018-12-29 NOTE — ED Provider Notes (Signed)
[facility] EMERGENCY DEPARTMENT Provider Note   CSN: [id number] Arrival date & time: 12/29/18  0751    History   Chief Complaint Chief Complaint  Patient presents with  . Weakness  . GI Bleeding    HPI [name] is a 53 y.o. female.     HPI   [name] is a 53 y.o. female, with a history of ESRD on dialysis, CHF, GERD, hepatitis C, anxiety, anemia, DM, HTN, presenting to the ED with bloody stools beginning last night.  She notes she has had at least 8 episodes of dark red rectal bleeding with clots.  Initially experienced right sided abdominal pain, but has not had any pain for several hours.  She denies anticoagulation.  She endorses generalized weakness and some mild shortness of breath with ambulation.  Patient is on a Tuesday, Thursday, Saturday dialysis schedule.  She states she missed dialysis Saturday, May 23 because she was moving.  Her last dialysis was Thursday, May 21.  Denies fever/chills, nausea/vomiting, current abdominal pain, lower extremity edema, orthopnea, chest pain, syncope, dizziness, cough, or any other complaints.   Past Medical History:  Diagnosis Date  . Anemia   . Anxiety   . Bipolar disorder, unspecified ([place]) 02/06/2014  . Chronic combined systolic and diastolic CHF (congestive heart failure) (HCC)    EF 40% by echo and 48% by stress test  . Chronic pain syndrome   . Depression   . ESRD on hemodialysis (HCC)    TTS  . GERD (gastroesophageal reflux disease)   . Glaucoma   . Headache    sinus headaches  . Hepatitis C   . High cholesterol   . History of hiatal hernia   . History of vitamin D deficiency 06/20/2015  . Hypertension   . MRSA infection ?2014   "on the back of my head; spread throughout my bloodstream"  . Neuropathy   . Refusal of blood transfusions as patient is Jehovah's Witness   . Schizoaffective disorder, unspecified condition 02/08/2014  . Seizure [facility]) dx'd 2014   "don't know what kind; last one  was ~ 04/2014"  . TIA (transient ischemic attack) 2010  . Type II diabetes mellitus ([place])    INSULIN DEPENDENT    Patient Active Problem List   Diagnosis Date Noted  . Gastritis and gastroduodenitis   . Benign neoplasm of ascending colon   . Benign neoplasm of transverse colon   . Benign neoplasm of descending colon   . GI bleeding 11/17/2018  . Acute blood loss anemia   . No blood products   . GI bleed 10/05/2018  . EKG abnormality 10/05/2018  . Localized edema due to fluid overload 09/05/2017  . Lower GI bleed   . Evaluation by psychiatric service required 07/17/2017  . Volume overload 06/03/2017  . Right supracondylar humerus fracture, with nonunion, subsequent encounter 05/01/2017  . Malnutrition of moderate degree 02/28/2017  . Leg pain, left 02/25/2017  . Left leg pain 02/25/2017  . External hemorrhoids   . Diverticulosis of colon without hemorrhage   . Goals of care, counseling/discussion   . Palliative care encounter   . Cellulitis of left leg 01/16/2017  . Upper GI bleed 01/16/2017  . UGIB (upper gastrointestinal bleed)   . Anemia due to chronic kidney disease   . Cellulitis of left thigh 01/01/2017  . ESRD on dialysis ([place]) 01/01/2017  . Cocaine use disorder, severe, dependence ([place]) 08/29/2016  . Hepatitis C 08/29/2016  . Diabetic ulcer  of calf ([place]) 08/29/2016  . Diabetic ulcer of toe ([place]) 08/29/2016  . Cerebrovascular accident (CVA) ([place]) 08/29/2016  . MDD (major depressive disorder) 08/28/2016  . Dyslipidemia associated with type 2 diabetes mellitus ([place]) 11/10/2015  . Hyperkalemia 09/17/2015  . History of vitamin D deficiency 06/20/2015  . Long term prescription opiate use 06/20/2015  . Chronic pain syndrome 06/19/2015  . Anemia of chronic kidney failure 05/01/2015  . transmetatarsal amputation of the left foot 12/13/2014  . Unilateral visual loss 11/22/2014  . 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 ([place]) 11/22/2014  . Essential hypertension  11/09/2014  . Dizziness   . Chronic combined systolic (congestive) and diastolic (congestive) heart failure ([place])   . Rectal bleeding 04/23/2014  . Anemia, iron deficiency 04/13/2014  . Noncompliance 02/02/2014  . Hyponatremia 05/19/2013  . Seizure disorder ([place]) 02/09/2013    Past Surgical History:  Procedure Laterality Date  . ABDOMINAL HYSTERECTOMY  2014  . ABDOMINAL HYSTERECTOMY  2010  . AMPUTATION Left 05/21/2013   Procedure: Left Midfoot AMPUTATION;  Surgeon: Newt Minion, MD;  Location: [facility];  Service: Orthopedics;  Laterality: Left;  Left Midfoot Amputation  . AV FISTULA PLACEMENT Left 05/04/2015   Procedure: ARTERIOVENOUS (AV) GRAFT INSERTION;  Surgeon: Angelia Mould, MD;  Location: [facility];  Service: Vascular;  Laterality: Left;  . BIOPSY  11/18/2018   Procedure: BIOPSY;  Surgeon: Jerene Bears, MD;  Location: [facility] ENDOSCOPY;  Service: Gastroenterology;;  . COLONOSCOPY WITH PROPOFOL N/A 06/22/2013   Procedure: COLONOSCOPY WITH PROPOFOL;  Surgeon: Jeryl [place], MD;  Location: [facility] ENDOSCOPY;  Service: Endoscopy;  Laterality: N/A;  . COLONOSCOPY WITH PROPOFOL N/A 11/18/2018   Procedure: COLONOSCOPY WITH PROPOFOL;  Surgeon: Jerene Bears, MD;  Location: [facility];  Service: Gastroenterology;  Laterality: N/A;  . ESOPHAGOGASTRODUODENOSCOPY N/A 05/11/2015   Procedure: ESOPHAGOGASTRODUODENOSCOPY (EGD);  Surgeon: Laurence Spates, MD;  Location[facility] ENDOSCOPY;  Service: Endoscopy;  Laterality: N/A;  . ESOPHAGOGASTRODUODENOSCOPY (EGD) WITH PROPOFOL N/A 11/18/2018   Procedure: ESOPHAGOGASTRODUODENOSCOPY (EGD) WITH PROPOFOL;  Surgeon: Jerene Bears, MD;  Location: [facility] ENDOSCOPY;  Service: Gastroenterology;  Laterality: N/A;  . EYE SURGERY Bilateral 2010   Lasik  . FLEXIBLE SIGMOIDOSCOPY N/A 01/17/2017   Procedure: FLEXIBLE SIGMOIDOSCOPY w/ hemorrhoid banding possible;  Surgeon: Gatha Mayer, MD;  Location: [facility] ENDOSCOPY;  Service: Endoscopy;  Laterality: N/A;  . FOOT AMPUTATION Left     due diabetic neuropathy, could not feel ulcer on bottom of foot  . POLYPECTOMY  11/18/2018   Procedure: POLYPECTOMY;  Surgeon: Jerene Bears, MD;  Location: [facility] ENDOSCOPY;  Service: Gastroenterology;;  . REFRACTIVE SURGERY Bilateral 2013  . TONSILLECTOMY  1970's  . TUBAL LIGATION  2000     OB History    Gravida  0   Para  0   Term  0   Preterm  0   AB  0   Living        SAB  0   TAB  0   Ectopic  0   Multiple      Live Births               Home Medications    Prior to Admission medications   Medication Sig Start Date End Date Taking? Authorizing Provider  albuterol (PROVENTIL HFA;VENTOLIN HFA) 108 (90 Base) MCG/ACT inhaler Inhale 2 puffs into the lungs every 4 (four) hours as needed for wheezing or shortness of breath. 10/15/16   Horton, Barbette Hair, MD  allopurinol (ZYLOPRIM)  100 MG tablet Take 1 tablet (100 mg total) by mouth every Tuesday, Thursday, and Saturday at 6 PM. 01/25/17   Nita Sells, MD  ALPRAZolam Duanne Moron) 0.5 MG tablet Take 1 tablet (0.5 mg total) by mouth every 8 (eight) hours as needed for anxiety. 11/20/18   Norval Morton, MD  amLODipine (NORVASC) 10 MG tablet Take 1 tablet (10 mg total) by mouth at bedtime. 09/02/16   Hildred Priest, MD  atorvastatin (LIPITOR) 40 MG tablet Take 1 tablet (40 mg total) by mouth at bedtime. 09/02/16   Hildred Priest, MD  bisacodyl (DULCOLAX) 10 MG suppository Place 10 mg rectally daily as needed (for constipation).    [provider]  calcitRIOL (ROCALTROL) 0.25 MCG capsule Take 7 capsules (1.75 mcg total) by mouth every dialysis (3 times per week on TTS). 10/10/18   Ghimire, Henreitta Leber, MD  calcium acetate (PHOSLO) 667 MG capsule Take 2 capsules (1,334 mg total) by mouth 3 (three) times daily with meals. 09/02/16   Hildred Priest, MD  diphenhydrAMINE (BENADRYL) 25 mg capsule Take 25 mg by mouth every 6 (six) hours as needed for itching.     [provider]   docusate sodium (COLACE) 100 MG capsule Take 100 mg by mouth 2 (two) times daily.    [provider]  DULoxetine (CYMBALTA) 30 MG capsule Take 90 mg by mouth daily. Take with 60mg  to equal 90mg  once daily    [provider]  DULoxetine (CYMBALTA) 60 MG capsule Take 60 mg by mouth daily. Take one capsule with 30mg  to equal 90mg .    [provider]  epoetin alfa (EPOGEN,PROCRIT) 63149 UNIT/ML injection Inject 1 mL (20,000 Units total) into the vein Every Tuesday,Thursday,and Saturday with dialysis. 10/10/18   Ghimire, Henreitta Leber, MD  Ferrous Sulfate (SLOW FE) 142 (45 Fe) MG TBCR Take 1 tablet by mouth daily. 07/19/17   Mariel Aloe, MD  folic acid (FOLVITE) 1 MG tablet Take 1 tablet (1 mg total) by mouth daily. 10/10/18   Ghimire, Henreitta Leber, MD  hydrocortisone (ANUSOL-HC) 2.5 % rectal cream Place rectally 2 (two) times daily. Patient taking differently: Place 1 application rectally 2 (two) times daily as needed for hemorrhoids.  01/24/17   Nita Sells, MD  insulin aspart (NOVOLOG) 100 UNIT/ML injection 0-9 Units, Subcutaneous, 3 times daily with meals CBG < 70: implement hypoglycemia protocol CBG 70 - 120: 0 units CBG 121 - 150: 1 unit CBG 151 - 200: 2 units CBG 201 - 250: 3 units CBG 251 - 300: 5 units CBG 301 - 350: 7 units CBG 351 - 400: 9 units CBG > 400: call MD 10/10/18   Jonetta Osgood, MD  Insulin Detemir (LEVEMIR FLEXTOUCH) 100 UNIT/ML Pen Inject 12 Units into the skin at bedtime. 10/10/18   Ghimire, Henreitta Leber, MD  labetalol (NORMODYNE) 200 MG tablet Take 1 tablet (200 mg total) by mouth 2 (two) times daily. 11/20/18   Norval Morton, MD  methocarbamol (ROBAXIN) 500 MG tablet Take 500 mg by mouth 3 (three) times daily as needed for muscle spasms.    [provider]  Multiple Vitamins-Minerals (CEROVITE SENIOR PO) Take 1 tablet by mouth at bedtime.    [provider]  multivitamin (RENA-VIT) TABS tablet Take 1 tablet by mouth at  bedtime. Patient not taking: Reported on 11/17/2018 09/02/16   Hildred Priest, MD  ondansetron (ZOFRAN) 4 MG tablet Take 4 mg by mouth every 8 (eight) hours as needed for nausea or vomiting.  [provider]  Oxycodone HCl 10 MG TABS Take 1 tablet (10 mg total) by mouth every 6 (six) hours as needed (for pain). 11/20/18   Norval Morton, MD  pantoprazole (PROTONIX) 40 MG tablet Take 1 tablet (40 mg total) by mouth daily at 6 (six) AM. 07/20/17   Mariel Aloe, MD  saccharomyces boulardii (FLORASTOR) 250 MG capsule Take 250 mg by mouth daily.    [provider]  senna (SENOKOT) 8.6 MG tablet Take 2 tablets by mouth daily.    [provider]  traZODone (DESYREL) 50 MG tablet Take 0.5 tablets (25 mg total) by mouth at bedtime. Patient taking differently: Take 50 mg by mouth at bedtime.  07/19/17   Mariel Aloe, MD    Family History Family History  Problem Relation Age of Onset  . Hypertension Mother   . Diabetes Mother   . Hypertension Father   . Diabetes Father     Social History Social History   Tobacco Use  . Smoking status: Current Some Day Smoker    Packs/day: 1.00    Years: 36.00    Pack years: 36.00    Types: Cigarettes  . Smokeless tobacco: Never Used  . Tobacco comment: 5 cigs in one setting  Substance Use Topics  . Alcohol use: Yes    Alcohol/week: 3.0 standard drinks    Types: 3 Standard drinks or equivalent per week    Frequency: Never    Comment: last use about 2 weeks ago  . Drug use: Not Currently    Types: "Crack" cocaine, Cocaine    Comment: last use >2 years ago     Allergies   Penicillins; Gabapentin; Lyrica [pregabalin]; Saphris [asenapine]; Tramadol; Adhesive [tape]; Latex; and Other   Review of Systems Review of Systems  Constitutional: Negative for chills, diaphoresis and fever.  Respiratory: Negative for shortness of breath.   Cardiovascular: Negative for chest pain and leg swelling.   Gastrointestinal: Positive for blood in stool. Negative for abdominal pain, nausea and vomiting.  Musculoskeletal: Negative for back pain.  Neurological: Positive for weakness. Negative for dizziness, syncope, light-headedness and numbness.  All other systems reviewed and are negative.    Physical Exam Updated Vital Signs BP 134/75 (BP Location: Right Arm)   Pulse 84   Temp 98 F (36.7 C) (Oral)   Resp 12   Ht 5\' 4"  (1.626 m)   Wt 74.8 kg   LMP  (LMP Unknown)   SpO2 97%   BMI 28.32 kg/m   Physical Exam Vitals signs and nursing note reviewed.  Constitutional:      General: She is not in acute distress.    Appearance: She is well-developed. She is not diaphoretic.  HENT:     Head: Normocephalic and atraumatic.     Mouth/Throat:     Mouth: Mucous membranes are moist.     Pharynx: Oropharynx is clear.  Eyes:     Conjunctiva/sclera: Conjunctivae normal.  Neck:     Musculoskeletal: Neck supple.  Cardiovascular:     Rate and Rhythm: Normal rate and regular rhythm.     Pulses: Normal pulses.          Radial pulses are 2+ on the right side and 2+ on the left side.       Posterior tibial pulses are 2+ on the right side and 2+ on the left side.     Heart sounds: Normal heart sounds.     Comments: Tactile temperature in the extremities  appropriate and equal bilaterally. Pulmonary:     Effort: Pulmonary effort is normal. No respiratory distress.     Breath sounds: Normal breath sounds.     Comments: Patient able to comfortably lie supine without difficulty. Abdominal:     Palpations: Abdomen is soft.     Tenderness: There is no abdominal tenderness. There is no guarding.  Genitourinary:    Rectum: Guaiac result positive.     Comments: Some maroon colored stool noted. External hemorrhoids noted.  No flowing hemorrhage noted.  No clots.  No stool burden. No rectal tenderness. No foreign bodies noted. RN served as Producer, television/film/video during the rectal exam. Musculoskeletal:     Right  lower leg: No edema.     Left lower leg: No edema.  Lymphadenopathy:     Cervical: No cervical adenopathy.  Skin:    General: Skin is warm and dry.  Neurological:     Mental Status: She is alert.  Psychiatric:        Mood and Affect: Mood and affect normal.        Speech: Speech normal.        Behavior: Behavior normal.      ED Treatments / Results  Labs (all labs ordered are listed, but only abnormal results are displayed) Labs Reviewed  CBC WITH DIFFERENTIAL/PLATELET - Abnormal; Notable for the following components:      Result Value   WBC 2.7 (*)    RBC 1.87 (*)    Hemoglobin 6.4 (*)    HCT 19.7 (*)    MCV 105.3 (*)    MCH 34.2 (*)    Platelets 93 (*)    All other components within normal limits  COMPREHENSIVE METABOLIC PANEL - Abnormal; Notable for the following components:   Sodium 133 (*)    Potassium 6.0 (*)    Chloride 93 (*)    CO2 19 (*)    Glucose, Bld 186 (*)    BUN 151 (*)    Creatinine, Ser 9.60 (*)    Calcium 8.6 (*)    Albumin 3.2 (*)    AST 58 (*)    ALT 56 (*)    Alkaline Phosphatase 130 (*)    GFR calc non Af Amer 4 (*)    GFR calc Af Amer 5 (*)    Anion gap 21 (*)    All other components within normal limits  PROTIME-INR - Abnormal; Notable for the following components:   Prothrombin Time 16.2 (*)    INR 1.3 (*)    All other components within normal limits  POC OCCULT BLOOD, ED - Abnormal; Notable for the following components:   Fecal Occult Bld POSITIVE (*)    All other components within normal limits  CBG MONITORING, ED - Abnormal; Notable for the following components:   Glucose-Capillary 183 (*)    All other components within normal limits  SARS CORONAVIRUS 2 (HOSPITAL ORDER, [facility])  GLUCOSE, RANDOM  TYPE AND SCREEN  PREPARE RBC (CROSSMATCH)   Hemoglobin  Date Value Ref Range Status  12/29/2018 6.4 (LL) 12.0 - 15.0 g/dL Final    Comment:    REPEATED TO VERIFY THIS CRITICAL RESULT HAS VERIFIED AND  BEEN CALLED TO RYAN HARDY,RN BY ZELDA BEECH ON 05 26 2020 AT 0900, AND HAS BEEN READ BACK.    11/20/2018 8.1 (L) 12.0 - 15.0 g/dL Final  11/20/2018 7.6 (L) 12.0 - 15.0 g/dL Final  11/19/2018 8.8 (L) 12.0 - 15.0 g/dL Final  Comment:    REPEATED TO VERIFY POST TRANSFUSION SPECIMEN    HGB  Date Value Ref Range Status  07/20/2013 9.3 (L) 11.6 - 15.9 g/dL Final    EKG EKG Interpretation  Date/Time:  Tuesday Dec 29 2018 09:14:45 EDT Ventricular Rate:  79 PR Interval:    QRS Duration: 107 QT Interval:  443 QTC Calculation: 508 R Axis:   86 Text Interpretation:  Sinus rhythm Borderline prolonged PR interval Probable left ventricular hypertrophy Borderline T abnormalities, lateral leads Borderline prolonged QT interval Since last EKG, T waves  appear more peaked Reconfirmed by Duffy Bruce [phone]) on 12/29/2018 9:25:29 AM Also confirmed by Duffy Bruce [phone]), editor Philomena Doheny [phone])  on 12/29/2018 10:37:46 AM   Radiology No results found.  Procedures .Critical Care Performed by: Lorayne Bender, PA-C Authorized by: Lorayne Bender, PA-C   Critical care provider statement:    Critical care time (minutes):  35   Critical care time was exclusive of:  Separately billable procedures and treating other patients   Critical care was necessary to treat or prevent imminent or life-threatening deterioration of the following conditions: Symptomatic anemia with active GI bleed.   Critical care was time spent personally by me on the following activities:  Development of treatment plan with patient or surrogate, discussions with consultants, evaluation of patient's response to treatment, examination of patient, obtaining history from patient or surrogate, ordering and performing treatments and interventions, ordering and review of laboratory studies, ordering and review of radiographic studies, pulse oximetry and re-evaluation of patient's condition   I assumed direction of critical care for  this patient from another provider in my specialty: no     (including critical care time)  Medications Ordered in ED Medications  0.9 %  sodium chloride infusion (Manually program via Guardrails IV Fluids) (has no administration in time range)  Chlorhexidine Gluconate Cloth 2 % PADS 6 each (has no administration in time range)  albuterol (PROVENTIL) (2.5 MG/3ML) 0.083% nebulizer solution 10 mg (10 mg Nebulization Given 12/29/18 1013)  insulin aspart (novoLOG) injection 5 Units (5 Units Intravenous Given 12/29/18 1009)    And  dextrose 50 % solution 50 mL (50 mLs Intravenous Given 12/29/18 1009)  pantoprazole (PROTONIX) injection 40 mg (40 mg Intravenous Given 12/29/18 1022)     Initial Impression / Assessment and Plan / ED Course  I have reviewed the triage vital signs and the nursing notes.  Pertinent labs & imaging results that were available during my care of the patient were reviewed by me and considered in my medical decision making (see chart for details).  Clinical Course as of Dec 28 1056  Tue Dec 29, 2018  0919 Spoke with patient about her low hemoglobin.  She states she did accept blood transfusion with her last GI bleed and she would accept it today.   [SJ]  [id number] Spoke with Dr. Marval Regal, nephrologist. States he will coordinate patient's dialysis and nephrology needs.    [SJ]  [place] with Tye Savoy, NP for [facility]. States they will consult on the patient.   [SJ]  52 Spoke with Dr. [name], hospitalist. Agrees to admit the patient.   [SJ]    Clinical Course User Index [SJ] Tashana Haberl C, PA-C       Patient presents with complaint of rectal bleeding.  Positive Hemoccult.  Hemoglobin 6.4. Patient is nontoxic appearing, afebrile, not tachycardic, not tachypneic, not hypotensive, maintains excellent SPO2 on room air, and is in no apparent distress.  However, she does seem to be symptomatic to this anemia with generalized weakness and occasional shortness of  breath.  Transfusion ordered.  GI consult placed Patient is also missed 2 dialysis sessions.  She has hyperkalemia with some peaked T waves on EKG today.  Nephrology will coordinate dialysis.  Patient admitted via hospitalist for further management.   Findings and plan of care discussed with Duffy Bruce, MD.    Vitals:   12/29/18 0756 12/29/18 0759 12/29/18 0800 12/29/18 1018  BP:  134/75 134/75 (!) 167/80  Pulse:  84 84 81  Resp:  12 (!) 9 13  Temp:  98 F (36.7 C)    TempSrc:  Oral    SpO2:  97% 100% 100%  Weight: 74.8 kg     Height: 5\' 4"  (1.626 m)        Final Clinical Impressions(s) / ED Diagnoses   Final diagnoses:  Rectal bleeding  Low hemoglobin  Prolonged QT interval  Hyperkalemia    ED Discharge Orders    None       Layla Maw 12/29/18 1059    Duffy Bruce, MD 12/30/18 2023

## 2018-12-29 NOTE — Progress Notes (Signed)
Renal Navigator notified OP HD clinic/[facility] of patient's admission and negative COVID 19 rapid test result to provide continuity of care and safety.   Alphonzo Cruise, [place] Renal Navigator [phone]

## 2018-12-29 NOTE — ED Notes (Signed)
Per Blood Bank - 2 units of blood ready. Ryan RN made aware @ 1054.

## 2018-12-29 NOTE — Procedures (Signed)
   I was present at this dialysis session, have reviewed the session itself and made  appropriate changes Kelly Splinter MD [place] pager [phone]   12/29/2018, 3:41 PM

## 2018-12-29 NOTE — Consult Note (Addendum)
[facility] Renal Consultation Note    Indication for Consultation:  Management of ESRD/hemodialysis, anemia, hypertension/volume, and secondary hyperparathyroidism. PCP:  HPI: [name] is a 53 y.o. female with ESRD, HTN, T2DM, HFrEF, Hep C, Bipolar, and Hx GI bleeds (last 11/2018) who is being admitted with rectal bleeding and hyperkalemia.  Pt endorses passing maroon colored stools since last night - multiple episodes. Initially had some LLQ pain, now resolved. No fever or chest pain. Did have some chills and dyspnea with exertion for several days. In ED, labs showed Na 133, K 6, Glu 186, BUN 151, Cr 9.6, WBC 2.7, Hgb 6.4 (down from 10.1 on 5/21), plts 93. She was screened for COVID which was negative. She has Hx Jehovah's Witness but has accepted blood during her last few admits with GI bleeding and consented this time as well, scheduled for 2U PRBCs.  Dialyzes on TTS schedule at [facility] clinic. Last HD was Demetrius Charity 5/21 - she missed her last dialysis on 5/23 due to moving from the SNF back home (transportation issue).  Past Medical History:  Diagnosis Date  . Anemia   . Anxiety   . Bipolar disorder, unspecified ([place]) 02/06/2014  . Chronic combined systolic and diastolic CHF (congestive heart failure) (HCC)    EF 40% by echo and 48% by stress test  . Chronic pain syndrome   . Depression   . ESRD on hemodialysis (HCC)    TTS  . GERD (gastroesophageal reflux disease)   . Glaucoma   . Headache    sinus headaches  . Hepatitis C   . High cholesterol   . History of hiatal hernia   . History of vitamin D deficiency 06/20/2015  . Hypertension   . MRSA infection ?2014   "on the back of my head; spread throughout my bloodstream"  . Neuropathy   . Refusal of blood transfusions as patient is Jehovah's Witness   . Schizoaffective disorder, unspecified condition 02/08/2014  . Seizure [facility]) dx'd 2014   "don't know what kind; last one was ~ 04/2014"  . TIA (transient ischemic  attack) 2010  . Type II diabetes mellitus ([place])    INSULIN DEPENDENT   Past Surgical History:  Procedure Laterality Date  . ABDOMINAL HYSTERECTOMY  2014  . ABDOMINAL HYSTERECTOMY  2010  . AMPUTATION Left 05/21/2013   Procedure: Left Midfoot AMPUTATION;  Surgeon: Newt Minion, MD;  Location: [place];  Service: Orthopedics;  Laterality: Left;  Left Midfoot Amputation  . AV FISTULA PLACEMENT Left 05/04/2015   Procedure: ARTERIOVENOUS (AV) GRAFT INSERTION;  Surgeon: Angelia Mould, MD;  Location: [place];  Service: Vascular;  Laterality: Left;  . BIOPSY  11/18/2018   Procedure: BIOPSY;  Surgeon: Jerene Bears, MD;  Location: [facility] ENDOSCOPY;  Service: Gastroenterology;;  . COLONOSCOPY WITH PROPOFOL N/A 06/22/2013   Procedure: COLONOSCOPY WITH PROPOFOL;  Surgeon: Jeryl Columbia, MD;  Location: [facility] ENDOSCOPY;  Service: Endoscopy;  Laterality: N/A;  . COLONOSCOPY WITH PROPOFOL N/A 11/18/2018   Procedure: COLONOSCOPY WITH PROPOFOL;  Surgeon: Jerene Bears, MD;  Location: [place];  Service: Gastroenterology;  Laterality: N/A;  . ESOPHAGOGASTRODUODENOSCOPY N/A 05/11/2015   Procedure: ESOPHAGOGASTRODUODENOSCOPY (EGD);  Surgeon: Laurence Spates, MD;  Location: [facility] ENDOSCOPY;  Service: Endoscopy;  Laterality: N/A;  . ESOPHAGOGASTRODUODENOSCOPY (EGD) WITH PROPOFOL N/A 11/18/2018   Procedure: ESOPHAGOGASTRODUODENOSCOPY (EGD) WITH PROPOFOL;  Surgeon: Jerene Bears, MD;  Location[facility] ENDOSCOPY;  Service: Gastroenterology;  Laterality: N/A;  . EYE SURGERY Bilateral 2010   Lasik  .  FLEXIBLE SIGMOIDOSCOPY N/A 01/17/2017   Procedure: FLEXIBLE SIGMOIDOSCOPY w/ hemorrhoid banding possible;  Surgeon: Gatha Mayer, MD;  Location: [facility] ENDOSCOPY;  Service: Endoscopy;  Laterality: N/A;  . FOOT AMPUTATION Left    due diabetic neuropathy, could not feel ulcer on bottom of foot  . POLYPECTOMY  11/18/2018   Procedure: POLYPECTOMY;  Surgeon: Jerene Bears, MD;  Location: [facility] ENDOSCOPY;  Service: Gastroenterology;;  .  REFRACTIVE SURGERY Bilateral 2013  . TONSILLECTOMY  1970's  . TUBAL LIGATION  2000   Family History  Problem Relation Age of Onset  . Hypertension Mother   . Diabetes Mother   . Hypertension Father   . Diabetes Father    Social History:  reports that she has been smoking cigarettes. She has a 36.00 pack-year smoking history. She has never used smokeless tobacco. She reports current alcohol use of about 3.0 standard drinks of alcohol per week. She reports previous drug use. Drugs: "Crack" cocaine and Cocaine.  ROS: As per HPI otherwise negative.  Physical Exam: Vitals:   12/29/18 1134 12/29/18 1145 12/29/18 1200 12/29/18 1215  BP: (!) 147/73 131/70 (!) 142/73 (!) 159/76  Pulse: 80 79 80 81  Resp: 20 18 14 20   Temp: 98.1 F (36.7 C)     TempSrc: Oral     SpO2:  96% 96% 97%  Weight:      Height:         General: Well developed, well nourished, in no acute distress. Head: Normocephalic, atraumatic, sclera non-icteric, mucus membranes are moist. Neck: Supple without lymphadenopathy/masses. JVD not elevated. Lungs: Clear bilaterally to auscultation without wheezes, rales, or rhonchi. Breathing is unlabored. Heart: RRR with normal S1, S2. No murmurs, rubs, or gallops appreciated. Abdomen: Soft, non-tender, non-distended with normoactive bowel sounds. No rebound/guarding.  Musculoskeletal:  Strength and tone appear normal for age. Lower extremities: No edema. Scattered scabbed areas. Neuro: Alert and oriented X 3. Moves all extremities spontaneously. Psych:  Responds to questions appropriately with a normal affect. Dialysis Access: L forearm AVG + bruit/thrill  Allergies  Allergen Reactions  . Penicillins Hives and Other (See Comments)    Has patient had a PCN reaction causing immediate rash, facial/tongue/throat swelling, SOB or lightheadedness with hypotension: Yes Has patient had a PCN reaction causing severe rash involving mucus membranes or skin necrosis: No Has patient had  a PCN reaction that required hospitalization No Has patient had a PCN reaction occurring within the last 10 years: No If all of the above answers are "NO", then may proceed with Cephalosporin use. Pt tolerates cefepime and ceftriaxone  . Gabapentin Itching and Swelling  . Lyrica [Pregabalin] Swelling and Other (See Comments)    Facial and leg swelling  . Saphris [Asenapine] Swelling and Other (See Comments)    Facial swelling  . Tramadol Swelling and Other (See Comments)    Leg swelling  . Adhesive [Tape] Itching    SEVERE ITCHING  . Latex Itching    SEVERE ITCHING  . Other Other (See Comments)    NO BLOOD PRODUCTS; PATIENT IS A JEHOVAH'S WITNESS   Prior to Admission medications   Medication Sig Start Date End Date Taking? Authorizing Provider  albuterol (PROVENTIL HFA;VENTOLIN HFA) 108 (90 Base) MCG/ACT inhaler Inhale 2 puffs into the lungs every 4 (four) hours as needed for wheezing or shortness of breath. 10/15/16  Yes Horton, Barbette Hair, MD  allopurinol (ZYLOPRIM) 100 MG tablet Take 1 tablet (100 mg total) by mouth every Tuesday, Thursday, and Saturday at 6  PM. 01/25/17  Yes Nita Sells, MD  ALPRAZolam Duanne Moron) 0.5 MG tablet Take 1 tablet (0.5 mg total) by mouth every 8 (eight) hours as needed for anxiety. 11/20/18  Yes Fuller Plan A, MD  atorvastatin (LIPITOR) 40 MG tablet Take 1 tablet (40 mg total) by mouth at bedtime. 09/02/16  Yes Hildred Priest, MD  bisacodyl (DULCOLAX) 10 MG suppository Place 10 mg rectally daily as needed (for constipation).   Yes [provider]  calcium acetate (PHOSLO) 667 MG capsule Take 2 capsules (1,334 mg total) by mouth 3 (three) times daily with meals. 09/02/16  Yes Hildred Priest, MD  diphenhydrAMINE (BENADRYL) 25 mg capsule Take 25 mg by mouth every 6 (six) hours as needed for itching.    Yes [provider]  docusate sodium (COLACE) 100 MG capsule Take 100 mg by mouth 2 (two) times daily.   Yes  [provider]  DULoxetine (CYMBALTA) 30 MG capsule Take 30 mg by mouth daily. Take with 60 for a total of 90   Yes [provider]  DULoxetine (CYMBALTA) 60 MG capsule Take 60 mg by mouth daily. Take one capsule with 30mg  to equal 90mg .   Yes [provider]  Ferrous Sulfate (SLOW FE) 142 (45 Fe) MG TBCR Take 1 tablet by mouth daily. 07/19/17  Yes Mariel Aloe, MD  folic acid (FOLVITE) 1 MG tablet Take 1 tablet (1 mg total) by mouth daily. 10/10/18  Yes Ghimire, Henreitta Leber, MD  insulin aspart (NOVOLOG) 100 UNIT/ML injection 0-9 Units, Subcutaneous, 3 times daily with meals CBG < 70: implement hypoglycemia protocol CBG 70 - 120: 0 units CBG 121 - 150: 1 unit CBG 151 - 200: 2 units CBG 201 - 250: 3 units CBG 251 - 300: 5 units CBG 301 - 350: 7 units CBG 351 - 400: 9 units CBG > 400: call MD 10/10/18  Yes Ghimire, Henreitta Leber, MD  Insulin Detemir (LEVEMIR FLEXTOUCH) 100 UNIT/ML Pen Inject 12 Units into the skin at bedtime. 10/10/18  Yes Ghimire, Henreitta Leber, MD  labetalol (NORMODYNE) 200 MG tablet Take 1 tablet (200 mg total) by mouth 2 (two) times daily. 11/20/18  Yes Fuller Plan A, MD  lidocaine-prilocaine (EMLA) cream Apply 1 application topically See admin instructions. On dialysis days 12/23/18  Yes [provider]  methocarbamol (ROBAXIN) 500 MG tablet Take 500 mg by mouth 3 (three) times daily as needed for muscle spasms.   Yes [provider]  Multiple Vitamins-Minerals (CEROVITE SENIOR PO) Take 1 tablet by mouth at bedtime.   Yes [provider]  multivitamin (RENA-VIT) TABS tablet Take 1 tablet by mouth at bedtime. 09/02/16  Yes Hildred Priest, MD  mupirocin ointment (BACTROBAN) 2 % Apply 1 application topically as needed (on arm).  12/01/18  Yes [provider]  ondansetron (ZOFRAN) 4 MG tablet Take 4 mg by mouth every 8 (eight) hours as needed for nausea or vomiting.   Yes [provider]  Oxycodone HCl 10 MG  TABS Take 1 tablet (10 mg total) by mouth every 6 (six) hours as needed (for pain). 11/20/18  Yes Fuller Plan A, MD  pantoprazole (PROTONIX) 40 MG tablet Take 1 tablet (40 mg total) by mouth daily at 6 (six) AM. 07/20/17  Yes Mariel Aloe, MD  saccharomyces boulardii (FLORASTOR) 250 MG capsule Take 250 mg by mouth daily.   Yes [provider]  senna (SENOKOT) 8.6 MG tablet Take 2 tablets by mouth daily.   Yes [provider]  traZODone (DESYREL) 50 MG tablet Take 0.5 tablets (25 mg total) by mouth at bedtime. Patient taking differently: Take 50 mg by mouth at bedtime.  07/19/17  Yes Mariel Aloe, MD  amLODipine (NORVASC) 10 MG tablet Take 1 tablet (10 mg total) by mouth at bedtime. Patient not taking: Reported on 12/29/2018 09/02/16   Hildred Priest, MD  calcitRIOL (ROCALTROL) 0.25 MCG capsule Take 7 capsules (1.75 mcg total) by mouth every dialysis (3 times per week on TTS). 10/10/18   Ghimire, Henreitta Leber, MD  epoetin alfa (EPOGEN,PROCRIT) 73220 UNIT/ML injection Inject 1 mL (20,000 Units total) into the vein Every Tuesday,Thursday,and Saturday with dialysis. 10/10/18   Ghimire, Henreitta Leber, MD  hydrocortisone (ANUSOL-HC) 2.5 % rectal cream Place rectally 2 (two) times daily. Patient not taking: Reported on 12/29/2018 01/24/17   Nita Sells, MD   Current Facility-Administered Medications  Medication Dose Route Frequency Provider Last Rate Last Dose  . 0.9 %  sodium chloride infusion (Manually program via Guardrails IV Fluids)   Intravenous Once Joy, Shawn C, PA-C      . 0.9 %  sodium chloride infusion  100 mL Intravenous PRN Stephania Fragmin R, PA-C      . 0.9 %  sodium chloride infusion  100 mL Intravenous PRN Loren Racer, PA-C      . Chlorhexidine Gluconate Cloth 2 % PADS 6 each  6 each Topical Q0600 Loren Racer, PA-C      . heparin injection 1,000 Units  1,000 Units Dialysis PRN Loren Racer, PA-C      . lidocaine (PF) (XYLOCAINE) 1 %  injection 5 mL  5 mL Intradermal PRN Loren Racer, PA-C      . lidocaine-prilocaine (EMLA) cream 1 application  1 application Topical PRN Stovall, Woodfin Ganja, PA-C      . pentafluoroprop-tetrafluoroeth (GEBAUERS) aerosol 1 application  1 application Topical PRN Loren Racer, PA-C       Current Outpatient Medications  Medication Sig Dispense Refill  . albuterol (PROVENTIL HFA;VENTOLIN HFA) 108 (90 Base) MCG/ACT inhaler Inhale 2 puffs into the lungs every 4 (four) hours as needed for wheezing or shortness of breath. 1 Inhaler 0  . allopurinol (ZYLOPRIM) 100 MG tablet Take 1 tablet (100 mg total) by mouth every Tuesday, Thursday, and Saturday at 6 PM. 60 tablet 1  . ALPRAZolam (XANAX) 0.5 MG tablet Take 1 tablet (0.5 mg total) by mouth every 8 (eight) hours as needed for anxiety. 12 tablet 0  . atorvastatin (LIPITOR) 40 MG tablet Take 1 tablet (40 mg total) by mouth at bedtime. 30 tablet 0  . bisacodyl (DULCOLAX) 10 MG suppository Place 10 mg rectally daily as needed (for constipation).    . calcium acetate (PHOSLO) 667 MG capsule Take 2 capsules (1,334 mg total) by mouth 3 (three) times daily with meals. 240 capsule 0  . diphenhydrAMINE (BENADRYL) 25 mg capsule Take 25 mg by mouth every 6 (six) hours as needed for itching.     . docusate sodium (COLACE) 100 MG capsule Take 100 mg by mouth 2 (two) times daily.    . DULoxetine (CYMBALTA) 30 MG capsule Take 30 mg by mouth daily. Take with 60 for a total of 90    . DULoxetine (CYMBALTA) 60 MG capsule Take 60 mg by mouth daily. Take one capsule with 30mg  to equal 90mg .    . Ferrous Sulfate (SLOW FE) 142 (45 Fe) MG TBCR Take 1 tablet by mouth daily. 30 tablet 0  . folic  acid (FOLVITE) 1 MG tablet Take 1 tablet (1 mg total) by mouth daily.    . insulin aspart (NOVOLOG) 100 UNIT/ML injection 0-9 Units, Subcutaneous, 3 times daily with meals CBG < 70: implement hypoglycemia protocol CBG 70 - 120: 0 units CBG 121 - 150: 1 unit CBG 151 - 200: 2  units CBG 201 - 250: 3 units CBG 251 - 300: 5 units CBG 301 - 350: 7 units CBG 351 - 400: 9 units CBG > 400: call MD 10 mL 11  . Insulin Detemir (LEVEMIR FLEXTOUCH) 100 UNIT/ML Pen Inject 12 Units into the skin at bedtime. 15 mL 11  . labetalol (NORMODYNE) 200 MG tablet Take 1 tablet (200 mg total) by mouth 2 (two) times daily. 60 tablet 0  . lidocaine-prilocaine (EMLA) cream Apply 1 application topically See admin instructions. On dialysis days    . methocarbamol (ROBAXIN) 500 MG tablet Take 500 mg by mouth 3 (three) times daily as needed for muscle spasms.    . Multiple Vitamins-Minerals (CEROVITE SENIOR PO) Take 1 tablet by mouth at bedtime.    . multivitamin (RENA-VIT) TABS tablet Take 1 tablet by mouth at bedtime. 30 tablet 0  . mupirocin ointment (BACTROBAN) 2 % Apply 1 application topically as needed (on arm).     . ondansetron (ZOFRAN) 4 MG tablet Take 4 mg by mouth every 8 (eight) hours as needed for nausea or vomiting.    . Oxycodone HCl 10 MG TABS Take 1 tablet (10 mg total) by mouth every 6 (six) hours as needed (for pain). 15 tablet 0  . pantoprazole (PROTONIX) 40 MG tablet Take 1 tablet (40 mg total) by mouth daily at 6 (six) AM. 30 tablet 0  . saccharomyces boulardii (FLORASTOR) 250 MG capsule Take 250 mg by mouth daily.    Marland Kitchen senna (SENOKOT) 8.6 MG tablet Take 2 tablets by mouth daily.    . traZODone (DESYREL) 50 MG tablet Take 0.5 tablets (25 mg total) by mouth at bedtime. (Patient taking differently: Take 50 mg by mouth at bedtime. ) 30 tablet 0  . amLODipine (NORVASC) 10 MG tablet Take 1 tablet (10 mg total) by mouth at bedtime. (Patient not taking: Reported on 12/29/2018) 30 tablet 0  . calcitRIOL (ROCALTROL) 0.25 MCG capsule Take 7 capsules (1.75 mcg total) by mouth every dialysis (3 times per week on TTS).    Marland Kitchen epoetin alfa (EPOGEN,PROCRIT) 27253 UNIT/ML injection Inject 1 mL (20,000 Units total) into the vein Every Tuesday,Thursday,and Saturday with dialysis. 1 mL   .  hydrocortisone (ANUSOL-HC) 2.5 % rectal cream Place rectally 2 (two) times daily. (Patient not taking: Reported on 12/29/2018) 30 g 0   Labs: Basic Metabolic Panel: Recent Labs  Lab 12/29/18 0823  NA 133*  K 6.0*  CL 93*  CO2 19*  GLUCOSE 186*  BUN 151*  CREATININE 9.60*  CALCIUM 8.6*   Liver Function Tests: Recent Labs  Lab 12/29/18 0823  AST 58*  ALT 56*  ALKPHOS 130*  BILITOT 0.8  PROT 6.5  ALBUMIN 3.2*   CBC: Recent Labs  Lab 12/29/18 0823  WBC 2.7*  NEUTROABS 1.7  HGB 6.4*  HCT 19.7*  MCV 105.3*  PLT 93*   CBG: Recent Labs  Lab 12/29/18 0956 12/29/18 1152  GLUCAP 183* 186*   Dialysis Orders: TTS at [facility] 3:30h, 400/A2.0, EDW 72.5kg, 2K/2Ca, #4, AVG, no heparin - Venofer 100mg  x 10 ordered (4 given so far) - Mircera 266mcg IV q 2 weeks (last given on  4/30 as outpatient). Hgb 10.1, tsat 46% on 5/21. - Calcitrol 55mcg PO q HD  Assessment/Plan: 1.  Rectal bleeding/symptomatic anemia: Passing blood since 5/25 - Hgb down to 6.4 on admit. Getting 2U PRBCs, GI consult pending. Also due for her ESA - will re-dose today. 2.  Hyperkalemia: K 6 in setting of missed dialysis, should correct with dialysis today. 3.  ESRD: Usual TTS schedule - missed last HD. For dialysis today, as above, 2K, no heparin. 4.  Hypertension/volume: BP slightly high, but low UF goal in setting of GIB. 5.  Metabolic bone disease: Ca ok. Follow for now - will resume home meds once allowed. 6.  T2DM 7.  HFrEF 8.  Hep Daine Gravel, PA-C 12/29/2018, 12:53 PM  [facility] Pager: [phone]  Pt seen, examined and agree w A/P as above.  Kelly Splinter  MD 12/29/2018, 3:37 PM

## 2018-12-30 DIAGNOSIS — D500 Iron deficiency anemia secondary to blood loss (chronic): Secondary | ICD-10-CM | POA: Diagnosis not present

## 2018-12-30 DIAGNOSIS — K644 Residual hemorrhoidal skin tags: Secondary | ICD-10-CM | POA: Diagnosis present

## 2018-12-30 DIAGNOSIS — H409 Unspecified glaucoma: Secondary | ICD-10-CM | POA: Diagnosis present

## 2018-12-30 DIAGNOSIS — F259 Schizoaffective disorder, unspecified: Secondary | ICD-10-CM | POA: Diagnosis present

## 2018-12-30 DIAGNOSIS — I5042 Chronic combined systolic (congestive) and diastolic (congestive) heart failure: Secondary | ICD-10-CM | POA: Diagnosis present

## 2018-12-30 DIAGNOSIS — D631 Anemia in chronic kidney disease: Secondary | ICD-10-CM | POA: Diagnosis present

## 2018-12-30 DIAGNOSIS — B1920 Unspecified viral hepatitis C without hepatic coma: Secondary | ICD-10-CM | POA: Diagnosis present

## 2018-12-30 DIAGNOSIS — L299 Pruritus, unspecified: Secondary | ICD-10-CM | POA: Diagnosis present

## 2018-12-30 DIAGNOSIS — E875 Hyperkalemia: Secondary | ICD-10-CM | POA: Diagnosis present

## 2018-12-30 DIAGNOSIS — K5900 Constipation, unspecified: Secondary | ICD-10-CM | POA: Diagnosis present

## 2018-12-30 DIAGNOSIS — E11622 Type 2 diabetes mellitus with other skin ulcer: Secondary | ICD-10-CM | POA: Diagnosis present

## 2018-12-30 DIAGNOSIS — R9431 Abnormal electrocardiogram [ECG] [EKG]: Secondary | ICD-10-CM | POA: Diagnosis not present

## 2018-12-30 DIAGNOSIS — K625 Hemorrhage of anus and rectum: Secondary | ICD-10-CM | POA: Diagnosis present

## 2018-12-30 DIAGNOSIS — F419 Anxiety disorder, unspecified: Secondary | ICD-10-CM | POA: Diagnosis present

## 2018-12-30 DIAGNOSIS — K219 Gastro-esophageal reflux disease without esophagitis: Secondary | ICD-10-CM | POA: Diagnosis present

## 2018-12-30 DIAGNOSIS — K921 Melena: Secondary | ICD-10-CM | POA: Diagnosis present

## 2018-12-30 DIAGNOSIS — G894 Chronic pain syndrome: Secondary | ICD-10-CM | POA: Diagnosis present

## 2018-12-30 DIAGNOSIS — N2581 Secondary hyperparathyroidism of renal origin: Secondary | ICD-10-CM | POA: Diagnosis present

## 2018-12-30 DIAGNOSIS — K922 Gastrointestinal hemorrhage, unspecified: Secondary | ICD-10-CM | POA: Diagnosis not present

## 2018-12-30 DIAGNOSIS — I132 Hypertensive heart and chronic kidney disease with heart failure and with stage 5 chronic kidney disease, or end stage renal disease: Secondary | ICD-10-CM | POA: Diagnosis present

## 2018-12-30 DIAGNOSIS — D62 Acute posthemorrhagic anemia: Secondary | ICD-10-CM | POA: Diagnosis present

## 2018-12-30 DIAGNOSIS — F319 Bipolar disorder, unspecified: Secondary | ICD-10-CM | POA: Diagnosis present

## 2018-12-30 DIAGNOSIS — N186 End stage renal disease: Secondary | ICD-10-CM | POA: Diagnosis present

## 2018-12-30 DIAGNOSIS — F17210 Nicotine dependence, cigarettes, uncomplicated: Secondary | ICD-10-CM | POA: Diagnosis present

## 2018-12-30 DIAGNOSIS — E1140 Type 2 diabetes mellitus with diabetic neuropathy, unspecified: Secondary | ICD-10-CM | POA: Diagnosis present

## 2018-12-30 DIAGNOSIS — E1122 Type 2 diabetes mellitus with diabetic chronic kidney disease: Secondary | ICD-10-CM | POA: Diagnosis present

## 2018-12-30 DIAGNOSIS — D509 Iron deficiency anemia, unspecified: Secondary | ICD-10-CM | POA: Diagnosis present

## 2018-12-30 DIAGNOSIS — Z20828 Contact with and (suspected) exposure to other viral communicable diseases: Secondary | ICD-10-CM | POA: Diagnosis present

## 2018-12-30 LAB — GLUCOSE, CAPILLARY
Glucose-Capillary: 219 mg/dL — ABNORMAL HIGH (ref 70–99)
Glucose-Capillary: 145 mg/dL — ABNORMAL HIGH (ref 70–99)
Glucose-Capillary: 104 mg/dL — ABNORMAL HIGH (ref 70–99)
Glucose-Capillary: 208 mg/dL — ABNORMAL HIGH (ref 70–99)

## 2018-12-30 LAB — BPAM RBC
Blood Product Expiration Date: 202006022359
Blood Product Expiration Date: 202006232359
ISSUE DATE / TIME: 202005261108
ISSUE DATE / TIME: 202005261334
Unit Type and Rh: 5100
Unit Type and Rh: 5100

## 2018-12-30 LAB — RENAL FUNCTION PANEL
Albumin: 3.1 g/dL — ABNORMAL LOW (ref 3.5–5.0)
Anion gap: 14 (ref 5–15)
BUN: 60 mg/dL — ABNORMAL HIGH (ref 6–20)
CO2: 26 mmol/L (ref 22–32)
Calcium: 8.9 mg/dL (ref 8.9–10.3)
Chloride: 95 mmol/L — ABNORMAL LOW (ref 98–111)
Creatinine, Ser: 5.24 mg/dL — ABNORMAL HIGH (ref 0.44–1.00)
GFR calc Af Amer: 10 mL/min — ABNORMAL LOW (ref >60)
GFR calc non Af Amer: 9 mL/min — ABNORMAL LOW (ref >60)
Glucose, Bld: 285 mg/dL — ABNORMAL HIGH (ref 70–99)
Phosphorus: 4.1 mg/dL (ref 2.5–4.6)
Potassium: 4.4 mmol/L (ref 3.5–5.1)
Sodium: 135 mmol/L (ref 135–145)

## 2018-12-30 LAB — TYPE AND SCREEN
ABO/RH(D): O POS
Antibody Screen: NEGATIVE
Unit division: 0
Unit division: 0

## 2018-12-30 LAB — CBC
HCT: 25.9 % — ABNORMAL LOW (ref 36.0–46.0)
Hemoglobin: 8.6 g/dL — ABNORMAL LOW (ref 12.0–15.0)
MCH: 33 pg (ref 26.0–34.0)
MCHC: 33.2 g/dL (ref 30.0–36.0)
MCV: 99.2 fL (ref 80.0–100.0)
Platelets: 88 10*3/uL — ABNORMAL LOW (ref 150–400)
RBC: 2.61 MIL/uL — ABNORMAL LOW (ref 3.87–5.11)
RDW: 16.5 % — ABNORMAL HIGH (ref 11.5–15.5)
WBC: 3.3 10*3/uL — ABNORMAL LOW (ref 4.0–10.5)
nRBC: 0 % (ref 0.0–0.2)

## 2018-12-30 LAB — MRSA PCR SCREENING: MRSA by PCR: POSITIVE — AB

## 2018-12-30 MED ORDER — PANTOPRAZOLE SODIUM 40 MG PO TBEC
40.0000 mg | DELAYED_RELEASE_TABLET | Freq: Every day | ORAL | Status: DC
  Administered 2018-12-30: 40 mg via ORAL
  Filled 2018-12-30: qty 1

## 2018-12-30 MED ORDER — MUPIROCIN 2 % EX OINT
1.0000 "application " | TOPICAL_OINTMENT | Freq: Two times a day (BID) | CUTANEOUS | Status: DC
  Administered 2018-12-30 (×2): 1 via NASAL
  Filled 2018-12-30: qty 22

## 2018-12-30 MED ORDER — HYDRALAZINE HCL 20 MG/ML IJ SOLN
5.0000 mg | INTRAMUSCULAR | Status: DC | PRN
  Administered 2018-12-31: 5 mg via INTRAVENOUS
  Filled 2018-12-30: qty 1

## 2018-12-30 MED ORDER — CALCITRIOL 0.5 MCG PO CAPS: 2.0000 ug | ORAL_CAPSULE | ORAL | Status: DC

## 2018-12-30 MED ORDER — CHLORHEXIDINE GLUCONATE CLOTH 2 % EX PADS
6.0000 | MEDICATED_PAD | Freq: Every day | CUTANEOUS | Status: DC
  Administered 2018-12-30: 6 via TOPICAL

## 2018-12-30 MED ORDER — MUPIROCIN CALCIUM 2 % EX CREA
TOPICAL_CREAM | Freq: Every day | CUTANEOUS | Status: DC
  Administered 2018-12-30: 10:00:00 via TOPICAL
  Filled 2018-12-30: qty 15

## 2018-12-30 NOTE — Consult Note (Addendum)
[place] Nurse wound consult note Reason for Consult: Consult requested for multiple: "wounds" all over body. Wound type: Dry scabbed round lesions to numerous locations on bilat extremities, buttocks, and trunk.  Scattered areas of patchy full thickness wounds to right leg; largest sites are 2X1X.1cm and 1X.5X.1cm and right 1st-2nd toe space; .2X.2X.1cm, red and dry.  Pt states the lesions begin as round, hard painful areas, which sometimes evolve into a full thickness wound.  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 This complex medical condition is difficult to promote healing with topical treatment; since the problem is systemic in nature; discussed plan of care with patient. .  Dressing procedure/placement/frequency: Bactroban to provide antimicrobial benefits and promote moist healing.  Foam dressings to areas of full thickness wounds to protect from further injury.  Please re-consult if further assistance is needed.  Thank-you,  Julien Girt MSN, [place], [place], [place], [place]

## 2018-12-30 NOTE — Anesthesia Preprocedure Evaluation (Addendum)
Anesthesia Evaluation  Patient identified by MRN, date of birth, ID band Patient awake    Reviewed: Allergy & Precautions, NPO status , Patient's Chart, lab work & pertinent test results, reviewed documented beta blocker date and time   History of Anesthesia Complications Negative for: history of anesthetic complications  Airway Mallampati: II  TM Distance: >3 FB Neck ROM: Full    Dental  (+) Missing,    Pulmonary Current Smoker,    Pulmonary exam normal        Cardiovascular hypertension, Pt. on home beta blockers and Pt. on medications +CHF  Normal cardiovascular exam  TTE 2017: EF 45% to 50%, diffuse hypokinesis, grade 2 diastolic dysfunction, mild MR     Neuro/Psych  Headaches, Seizures - (last 2015), Well Controlled,  Anxiety Depression Bipolar Disorder Schizophrenia TIA   GI/Hepatic hiatal hernia, GERD  Medicated,(+) Hepatitis -, C  Endo/Other  diabetes, Type 2, Insulin Dependent  Renal/GU ESRF and DialysisRenal disease (T/Th/Sa)     Musculoskeletal negative musculoskeletal ROS (+)   Abdominal   Peds  Hematology  (+) anemia , JEHOVAH'S WITNESS (Prefers no blood products, however pt states that she is OK with blood products to avoid significant morbidity/mortality)  Anesthesia Other Findings Day of surgery medications reviewed with the patient.  Reproductive/Obstetrics                           Anesthesia Physical Anesthesia Plan  ASA: III  Anesthesia Plan: MAC   Post-op Pain Management:    Induction: Intravenous  PONV Risk Score and Plan: 1 and Treatment may vary due to age or medical condition and Propofol infusion  Airway Management Planned: Natural Airway and Nasal Cannula  Additional Equipment:   Intra-op Plan:   Post-operative Plan:   Informed Consent: I have reviewed the patients History and Physical, chart, labs and discussed the procedure including the risks,  benefits and alternatives for the proposed anesthesia with the patient or authorized representative who has indicated his/her understanding and acceptance.     Dental advisory given  Plan Discussed with: CRNA  Anesthesia Plan Comments:        Anesthesia Quick Evaluation

## 2018-12-30 NOTE — Progress Notes (Addendum)
Progress Note    ASSESSMENT AND PLAN:     59. 53 yo female with recurrent painless hematochezia / acute on chronic anemia. No significant bleeding since I saw her in ED yesterday and this am passed only a small "clump" of dark stool with dark red blood.  Hgb up appropriately by 2 grams to 8.6  following 2uPRBC yesterday -Etiology of this recurrent bleed is still undetermined. Diverticular hemorrhage possible but also may be intestinal AVMs.  -Plan is for push enteroscopy tomorrow at 8am   2. Thrombocytopenia, chronic but slightly progressive. Also with mild coagulopathy. [place] in Oct 2016 raised suspicion for cirrhosis. No evidence for portal HTN on recent EGD    SUBJECTIVE   Some discomfort in left mid and LUQ when laying on left side this am. Feels better laying on right side. Bleeding has slowed and almost resolves. Had a small tarry stool with dark red blood this am.   OBJECTIVE:     Vital signs in last 24 hours: Temp:  [97.4 F (36.3 C)-98.4 F (36.9 C)] 98.4 F (36.9 C) (05/27 0445) Pulse Rate:  [79-94] 88 (05/27 0445) Resp:  [13-24] 21 (05/27 0445) BP: (131-207)/(70-101) 176/91 (05/27 0445) SpO2:  [96 %-100 %] 99 % (05/27 0445) Weight:  [73.6 kg-77.8 kg] 73.6 kg (05/26 2038) Last BM Date: 12/28/18 General:   Alert, well-developed female in NAD EENT:  Normal hearing, non icteric sclera, conjunctive pink.  Heart:  Regular rate and rhythm.  No lower extremity edema   Pulm: Normal respiratory effort Abdomen:  Soft, nondistended, nontender.  Normal bowel sounds,.       Neurologic:  Alert and  oriented x4;  grossly normal neurologically. Psych:  Pleasant, cooperative.  Normal mood and affect.   Intake/Output from previous day: 05/26 0701 - 05/27 0700 In: 697 [P.O.:120; Blood:577] Out: 2101 [Stool:1] Intake/Output this shift: Total I/O In: 200 [P.O.:200] Out: -   Lab Results: Recent Labs    12/29/18 0823 12/29/18 1844 12/30/18 0540  WBC 2.7*  --  3.3*  HGB  6.4* 9.0* 8.6*  HCT 19.7* 26.1* 25.9*  PLT 93*  --  88*   BMET Recent Labs    12/29/18 0823 12/30/18 0540  NA 133* 135  K 6.0* 4.4  CL 93* 95*  CO2 19* 26  GLUCOSE 186* 285*  BUN 151* 60*  CREATININE 9.60* 5.24*  CALCIUM 8.6* 8.9   LFT Recent Labs    12/29/18 0823 12/30/18 0540  PROT 6.5  --   ALBUMIN 3.2* 3.1*  AST 58*  --   ALT 56*  --   ALKPHOS 130*  --   BILITOT 0.8  --    PT/INR Recent Labs    12/29/18 0823  LABPROT 16.2*  INR 1.3*   Hepatitis Panel No results for input(s): HEPBSAG, HCVAB, HEPAIGM, HEPBIGM in the last 72 hours.  No results found.   LOS: 0 days   Tye Savoy ,NP 12/30/2018, 9:17 AM  GI ATTENDING  Interval history and data reviewed.  Patient seen and examined.  Agree with interval progress note as outlined above.  Recurrent GI bleeding.  Suspicious of proximal small bowel lesion such as AVM.  Fortunately, no active bleeding now and has had an appropriate response to transfusion.  As for upper endoscopy with push enteroscopy tomorrow morning.  The patient is high risk given her comorbidities.The nature of the procedure, as well as the risks, benefits, and alternatives were carefully and thoroughly reviewed with the patient. Ample  time for discussion and questions allowed. The patient understood, was satisfied, and agreed to proceed.  [name]. Geri Seminole., M.D. [facility]

## 2018-12-30 NOTE — H&P (View-Only) (Signed)
Progress Note    ASSESSMENT AND PLAN:     81. 53 yo female with recurrent painless hematochezia / acute on chronic anemia. No significant bleeding since I saw her in ED yesterday and this am passed only a small "clump" of dark stool with dark red blood.  Hgb up appropriately by 2 grams to 8.6  following 2uPRBC yesterday -Etiology of this recurrent bleed is still undetermined. Diverticular hemorrhage possible but also may be intestinal AVMs.  -Plan is for push enteroscopy tomorrow at 8am   2. Thrombocytopenia, chronic but slightly progressive. Also with mild coagulopathy. [place] in Oct 2016 raised suspicion for cirrhosis. No evidence for portal HTN on recent EGD    SUBJECTIVE   Some discomfort in left mid and LUQ when laying on left side this am. Feels better laying on right side. Bleeding has slowed and almost resolves. Had a small tarry stool with dark red blood this am.   OBJECTIVE:     Vital signs in last 24 hours: Temp:  [97.4 F (36.3 C)-98.4 F (36.9 C)] 98.4 F (36.9 C) (05/27 0445) Pulse Rate:  [79-94] 88 (05/27 0445) Resp:  [13-24] 21 (05/27 0445) BP: (131-207)/(70-101) 176/91 (05/27 0445) SpO2:  [96 %-100 %] 99 % (05/27 0445) Weight:  [73.6 kg-77.8 kg] 73.6 kg (05/26 2038) Last BM Date: 12/28/18 General:   Alert, well-developed female in NAD EENT:  Normal hearing, non icteric sclera, conjunctive pink.  Heart:  Regular rate and rhythm.  No lower extremity edema   Pulm: Normal respiratory effort Abdomen:  Soft, nondistended, nontender.  Normal bowel sounds,.       Neurologic:  Alert and  oriented x4;  grossly normal neurologically. Psych:  Pleasant, cooperative.  Normal mood and affect.   Intake/Output from previous day: 05/26 0701 - 05/27 0700 In: 697 [P.O.:120; Blood:577] Out: 2101 [Stool:1] Intake/Output this shift: Total I/O In: 200 [P.O.:200] Out: -   Lab Results: Recent Labs    12/29/18 0823 12/29/18 1844 12/30/18 0540  WBC 2.7*  --  3.3*  HGB  6.4* 9.0* 8.6*  HCT 19.7* 26.1* 25.9*  PLT 93*  --  88*   BMET Recent Labs    12/29/18 0823 12/30/18 0540  NA 133* 135  K 6.0* 4.4  CL 93* 95*  CO2 19* 26  GLUCOSE 186* 285*  BUN 151* 60*  CREATININE 9.60* 5.24*  CALCIUM 8.6* 8.9   LFT Recent Labs    12/29/18 0823 12/30/18 0540  PROT 6.5  --   ALBUMIN 3.2* 3.1*  AST 58*  --   ALT 56*  --   ALKPHOS 130*  --   BILITOT 0.8  --    PT/INR Recent Labs    12/29/18 0823  LABPROT 16.2*  INR 1.3*   Hepatitis Panel No results for input(s): HEPBSAG, HCVAB, HEPAIGM, HEPBIGM in the last 72 hours.  No results found.   LOS: 0 days   Tye Savoy ,NP 12/30/2018, 9:17 AM  GI ATTENDING  Interval history and data reviewed.  Patient seen and examined.  Agree with interval progress note as outlined above.  Recurrent GI bleeding.  Suspicious of proximal small bowel lesion such as AVM.  Fortunately, no active bleeding now and has had an appropriate response to transfusion.  As for upper endoscopy with push enteroscopy tomorrow morning.  The patient is high risk given her comorbidities.The nature of the procedure, as well as the risks, benefits, and alternatives were carefully and thoroughly reviewed with the patient. Ample  time for discussion and questions allowed. The patient understood, was satisfied, and agreed to proceed.  [name]. Geri Seminole., M.D. [facility] Division of Gastroenterology

## 2018-12-30 NOTE — Progress Notes (Signed)
PROGRESS NOTE    [name]  URK:[id number] DOB: 19-Aug-1965 DOA: 12/29/2018 PCP: Patient, [name]   Brief Narrative:  Admitting H&P: [name] is a 53 y.o. female with medical history significant of ESRD on HD, Jehovah's Witness, combined CHF, DM type II, hepatitis C, bipolar disorder, PVD s/p left TMA, anxiety, anemia, and GI bleed; who presents with complaints of rectal bleeding since last night.  She reports having 8 episodes of with bleeding dark red blood since last night.  Notes that she chronically has pain, but did not associate any pain with bleeding episodes per se.  She had been at a nursing facility until 5/22 when she moved back home.  They did not notified transportation and therefore she missed her hemodialysis session 5/23.  She noted having some shortness of breath that she related to the missed hemodialysis session.  Other associated symptoms include complaints of constipation, itching, and sores all over her body.  She had been utilizing the mupirocin cream without relief.  At the nursing facility she states her roommate had MRSA and thinks that is what she has as well.  She has tried Benadryl and hydroxyzine without relief of symptoms.    During last hospitalization in April patient presented with complaints of GI bleed seen by [facility].  EGD revealed gastritis/duodenitis needing Protonix daily.  Colonoscopy revealed hemorrhoids in the perianal exam and multiple polyps removed.   ED Course: Upon admission into the emergency department patient was noted to be afebrile, respirations 9-21, blood pressure elevated up to 169/83, and all other vitals maintained.  Labs significant for WBC 2.1, hemoglobin 6.4, platelets 93, potassium 6, BUN 151, creatinine 9.6, and anion gap 21.  Patient was typed and screened and ordered to be transfused 2 units of packed red blood cells.  [facility] consulted as well as nephrology for need of hemodialysis as she missed her last session.    Assessment & Plan:   Principal Problem:   Rectal bleeding Active Problems:   Anemia, iron deficiency   Essential hypertension   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 (HCC)   Chronic pain syndrome   Hyperkalemia   ESRD on dialysis (HCC)   Prolonged QT interval   Rectal bleed   Rectal bleeding, symptomatic anemia: Acute on chronic.  Hemoglobin down to 6.4 g/dL on admission.  Reports having several episodes of red blood per rectum as well as being constipated.  During last colonoscopy in March patient noted to have hemorrhoids and polyps evaluated were removed.  Patient was given 1 dose of Protonix and ordered to be transfused 2 units of packed red blood cells.  She denies any further reports of bleeding since admission. -Monitor intake and output -Clear liquid diet for and restart renal and carb modified diet if no recurrence of bleeding -Trend hemoglobin -Senokot -Colace -[facility] consulted, plan for enteroscopy tomorrow  ESRD on HD: Patient normally dialyzes Tuesday, Thursday, and Saturday.  Reportedly missed her last session of hemodialysis on 5/23.  Potassium elevated at 6.  Dr. Jonnie Finner of nephrology consulted and will dialyze patient. -HD per nephrology  Hyperkalemia: Acute.  Initial potassium noted to be elevated at 6 with signs of ST wave changes.  Patient was given insulin and calcium gluconate.  Patient taken for hemodialysis. 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  Has been trying mupirocin cream without relief. -Benadryl as needed -Continue mupirocin cream -  Wound care consult appreciated  Prolonged QT interval: Acute.  Admission QTc 508. -Correct electrolyte abnormality -RecheckED EKG in a.m. QTC now 486 -Hold QT prolonging medications   Left ear pain: Unclear cause of ear pain and discomfort.  Unable to be assessed with otoscope.  Patient reports  it drained several days ago had no pain and no complications since  Chronic combined CHF: Last EF 45 to 50% with grade 2 diastolic dysfunction.  Patient appears to be fluid overloaded at this time needing dialysis. -Continue to monitor  Thrombocytopenia: Acute on chronic.  Platelet count 93 which appears to be similar to previous. -Continue to monitor  Essential hypertension: Blood pressures mildly elevated -Continue labetalol  Diabetes mellitus type 2: Last hemoglobin A1c 5.9.  At home patient normally takes Levemir 12 units nightlyand NovoLog 9 units with meals. -Hypoglycemic protocol -CBGs q. before meals with moderate SSI -Restart Levemir when tolerating meals  Anxiety/depression -Continue Cymbalta, Xanax as needed  Chronic pain -Continue oxycodone as needed  Hyperlipidemia -Continue atorvastatin  DVT prophylaxis: SCD/Compression stockings  Code Status: FULL    Code Status Orders  (From admission, onward)         Start     Ordered   12/29/18 1345  Full code  Continuous     12/29/18 1349        Code Status History    Date Active Date Inactive Code Status Order ID Comments User Context   11/17/2018 1222 11/20/2018 1925 Full Code [id number]  Karmen Bongo, MD ED   10/05/2018 0345 10/10/2018 1641 Full Code [id number]  Shela Leff, MD ED   03/16/2018 2311 03/19/2018 1818 Full Code [id number]  Jani Gravel, MD ED   08/07/2017 1940 08/11/2017 2048 Full Code [id number]  Karmen Bongo, MD ED   07/18/2017 1552 07/20/2017 0034 DNR [id number]  Philis Pique, NP Inpatient   07/16/2017 1906 07/18/2017 1552 Full Code [id number]  Geradine Girt, DO Inpatient   06/03/2017 1701 06/07/2017 1820 Full Code [id number]  Valinda Party, DO ED   06/03/2017 1701 06/03/2017 1701 Full Code [id number]  Valinda Party, DO ED   02/25/2017 2023 03/01/2017 1316 Full Code [id number]  Rosita Fire, MD Inpatient   01/16/2017 1250 01/24/2017 2120 Full Code [id number]   Fransico Meadow, PA-C ED   01/01/2017 0058 01/02/2017 2119 Full Code [id number]  Etta Quill, DO ED   08/28/2016 2247 09/04/2016 1613 Full Code [id number]  Hildred Priest, MD Inpatient   08/26/2016 0527 08/28/2016 2227 Full Code [id number]  [name], MD ED   07/21/2016 1305 07/26/2016 1639 Full Code [id number]  Rondel Jumbo, PA-C ED   03/03/2016 1524 03/05/2016 1709 Full Code [id number]  Willia Craze, NP ED   01/13/2016 2344 01/17/2016 1617 Full Code [id number]  Merton Border, MD Inpatient   09/17/2015 0406 09/20/2015 2315 Full Code [id number]  Ivor Costa, MD ED   05/10/2015 0137 05/12/2015 2202 Full Code [id number]  Ivor Costa, MD ED   05/01/2015 2037 05/07/2015 1952 Full Code [id number]  Theodis Blaze, MD Inpatient   11/22/2014 1238 11/24/2014 1518 Full Code [id number]  Samella Parr, NP Inpatient   11/09/2014 0210 11/11/2014 1416 Full Code [id number]  Ivor Costa, MD ED   10/28/2014 0110 11/01/2014 1430 Full Code [id number]  Britt Bottom, NP ED   10/28/2014 0002 10/28/2014 0110 Full Code [id number]  Delaine Lame ED   08/25/2014 0251 08/27/2014 1648 Full Code [id number]  Allyne Gee, MD Inpatient   07/05/2014 [phone]  07/10/2014 1504 Full Code [id number]  Charlynne Cousins, MD Inpatient   06/22/2014 1811 06/25/2014 1248 Full Code [id number]  Jonetta Osgood, MD Inpatient   04/22/2014 1238 04/25/2014 2014 Full Code [id number]  Orson Eva, MD Inpatient   04/12/2014 0255 04/14/2014 2045 Full Code [id number]  Berle Mull, MD Inpatient   02/02/2014 0509 02/10/2014 1608 Full Code [id number]  Theressa Millard, MD Inpatient   06/20/2013 1627 06/25/2013 1747 Full Code [id number]  Theodis Blaze, MD Inpatient   06/02/2013 0106 06/04/2013 1648 Full Code [id number]  Rise Patience, MD Inpatient   05/21/2013 2012 05/24/2013 2055 Full Code [id number]  Newt Minion, MD Inpatient   05/17/2013 2223 05/21/2013 2012 Full Code [id number]  Etta Quill, DO ED   02/09/2013 1312 02/11/2013 2001 Full Code [id number]   Bonnielee Haff, MD Inpatient     Family Communication: CALLED SON, n/a lm Disposition Plan:   Patient changed to inpatient for severity of illness to include acute blood loss anemia requiring transfusions with monitoring, further subspecialty evaluation with gastroenterology with planned enteroscopy.  Patient also will require additional sub-specialization consultation with nephrology for end-stage renal disease on dialysis with frequent electrolyte monitoring.  Without these treatments patient at risk of electrolyte disarray as well as continued blood loss and hemodynamic collapse and possible death. Consults called: None Admission status: Inpatient   Consultants:   Gastroenterology and nephrology  Procedures:   No results found.  Antimicrobials:   None   Subjective: No acute events overnight resting comfortably in bed.  Objective: Vitals:   12/29/18 2038 12/30/18 0445 12/30/18 0923 12/30/18 1625  BP: (!) 168/87 (!) 176/91 (!) 166/125 (!) 149/68  Pulse: 91 88 95 88  Resp: (!) 24 (!) 21 20 18   Temp: (!) 97.5 F (36.4 C) 98.4 F (36.9 C) 98.8 F (37.1 C)   TempSrc: Oral Oral Oral   SpO2: 98% 99% 97% 98%  Weight: 73.6 kg     Height:        Intake/Output Summary (Last 24 hours) at 12/30/2018 1704 Last data filed at 12/30/2018 1400 Gross per 24 hour  Intake 640 ml  Output 1 ml  Net 639 ml   Filed Weights   12/29/18 1320 12/29/18 1650 12/29/18 2038  Weight: 77.8 kg 75.8 kg 73.6 kg    Examination:  General exam: Appears calm and comfortable  Respiratory system: Clear to auscultation. Respiratory effort normal. Cardiovascular system: S1 & S2 heard, RRR. No JVD, murmurs, rubs, gallops or clicks. No pedal edema. Gastrointestinal system: Abdomen is nondistended, soft and nontender. No organomegaly or masses felt. Normal bowel sounds heard. Central nervous system: Alert and oriented. No focal neurological deficits. Extremities: Symmetric 5 x 5 power. Skin: Notable dry  scaly brown lesions on extremities buttocks and trunk full-thickness wounds to right leg Psychiatry: Judgement and insight appear normal. Mood & affect appropriate.     Data Reviewed: I have personally reviewed following labs and imaging studies  CBC: Recent Labs  Lab 12/29/18 0823 12/29/18 1844 12/30/18 0540  WBC 2.7*  --  3.3*  NEUTROABS 1.7  --   --   HGB 6.4* 9.0* 8.6*  HCT 19.7* 26.1* 25.9*  MCV 105.3*  --  99.2  PLT 93*  --  88*   Basic Metabolic Panel: Recent Labs  Lab 12/29/18 0823 12/30/18 0540  NA 133* 135  K 6.0* 4.4  CL 93* 95*  CO2 19* 26  GLUCOSE 186* 285*  BUN 151* 60*  CREATININE 9.60* 5.24*  CALCIUM 8.6* 8.9  PHOS  --  4.1   GFR: Estimated Creatinine Clearance: 12.4 mL/min (A) (by C-G formula based on SCr of 5.24 mg/dL (H)). Liver Function Tests: Recent Labs  Lab 12/29/18 0823 12/30/18 0540  AST 58*  --   ALT 56*  --   ALKPHOS 130*  --   BILITOT 0.8  --   PROT 6.5  --   ALBUMIN 3.2* 3.1*   No results for input(s): LIPASE, AMYLASE in the last 168 hours. No results for input(s): AMMONIA in the last 168 hours. Coagulation Profile: Recent Labs  Lab 12/29/18 0823  INR 1.3*   Cardiac Enzymes: No results for input(s): CKTOTAL, CKMB, CKMBINDEX, TROPONINI in the last 168 hours. BNP (last 3 results) No results for input(s): PROBNP in the last 8760 hours. HbA1C: No results for input(s): HGBA1C in the last 72 hours. CBG: Recent Labs  Lab 12/29/18 1818 12/29/18 2044 12/30/18 0700 12/30/18 1121 12/30/18 1624  GLUCAP 96 143* 219* 145* 104*   Lipid Profile: No results for input(s): CHOL, HDL, LDLCALC, TRIG, CHOLHDL, LDLDIRECT in the last 72 hours. Thyroid Function Tests: No results for input(s): TSH, T4TOTAL, FREET4, T3FREE, THYROIDAB in the last 72 hours. Anemia Panel: No results for input(s): VITAMINB12, FOLATE, FERRITIN, TIBC, IRON, RETICCTPCT in the last 72 hours. Sepsis Labs: No results for input(s): PROCALCITON, LATICACIDVEN in  the last 168 hours.  Recent Results (from the past 240 hour(s))  SARS Coronavirus 2 (CEPHEID - Performed in [place] hospital lab), Hosp Order     Status: None   Collection Time: 12/29/18 10:53 AM  Result Value Ref Range Status   SARS Coronavirus 2 NEGATIVE NEGATIVE Final    Comment: (NOTE) If result is NEGATIVE SARS-CoV-2 target nucleic acids are NOT DETECTED. The SARS-CoV-2 RNA is generally detectable in upper and lower  respiratory specimens during the acute phase of infection. The lowest  concentration of SARS-CoV-2 viral copies this assay can detect is 250  copies / mL. A negative result does not preclude SARS-CoV-2 infection  and should not be used as the sole basis for treatment or other  patient management decisions.  A negative result may occur with  improper specimen collection / handling, submission of specimen other  than nasopharyngeal swab, presence of viral mutation(s) within the  areas targeted by this assay, and inadequate number of viral copies  (<250 copies / mL). A negative result must be combined with clinical  observations, patient history, and epidemiological information. If result is POSITIVE SARS-CoV-2 target nucleic acids are DETECTED. The SARS-CoV-2 RNA is generally detectable in upper and lower  respiratory specimens dur ing the acute phase of infection.  Positive  results are indicative of active infection with SARS-CoV-2.  Clinical  correlation with patient history and other diagnostic information is  necessary to determine patient infection status.  Positive results do  not rule out bacterial infection or co-infection with other viruses. If result is PRESUMPTIVE POSTIVE SARS-CoV-2 nucleic acids MAY BE PRESENT.   A presumptive positive result was obtained on the submitted specimen  and confirmed on repeat testing.  While 2019 novel coronavirus  (SARS-CoV-2) nucleic acids may be present in the submitted sample  additional confirmatory testing may be  necessary for epidemiological  and / or clinical management purposes  to differentiate between  SARS-CoV-2 and other Sarbecovirus currently known to infect humans.  If clinically indicated additional testing with an alternate test  methodology [phone]) is advised. The SARS-CoV-2 RNA is generally  detectable in upper  and lower respiratory sp ecimens during the acute  phase of infection. The expected result is Negative. Fact Sheet for Patients:  StrictlyIdeas.no Fact Sheet for Healthcare Providers: BankingDealers.co.za This test is not yet approved or cleared by the [place] FDA and has been authorized for detection and/or diagnosis of SARS-CoV-2 by FDA under an Emergency Use Authorization (EUA).  This EUA will remain in effect (meaning this test can be used) for the duration of the COVID-19 declaration under Section 564(b)(1) of the Act, 21 U.S.C. section 360bbb-3(b)(1), unless the authorization is terminated or revoked sooner. Performed at [facility], [place] [address] [place], [place] [address]   MRSA PCR Screening     Status: Abnormal   Collection Time: 12/29/18  8:29 PM  Result Value Ref Range Status   MRSA by PCR POSITIVE (A) NEGATIVE Final    Comment:        The GeneXpert MRSA Assay (FDA approved for NASAL specimens only), is one component of a comprehensive MRSA colonization surveillance program. It is not intended to diagnose MRSA infection nor to guide or monitor treatment for MRSA infections. RESULT CALLED TO, READ BACK BY AND VERIFIED WITHBarnabas Harries RN 12/30/18 0011 JDW Performed at [facility], [place] [address], [place], [place] [address]          Radiology Studies: No results found.      Scheduled Meds: . sodium chloride   Intravenous Once  . [START ON 12/31/2018] allopurinol  100 mg Oral Q T,Th,Sat-1800  . atorvastatin  40 mg Oral QHS  . [START ON 12/31/2018] calcitRIOL  2 mcg Oral Q  T,Th,Sa-HD  . calcium acetate  1,334 mg Oral TID WC  . Chlorhexidine Gluconate Cloth  6 each Topical Q0600  . darbepoetin (ARANESP) injection - DIALYSIS  200 mcg Intravenous Q Tue-HD  . DULoxetine  90 mg Oral Daily  . ferrous sulfate  325 mg Oral Q breakfast  . folic acid  1 mg Oral Daily  . hydrocortisone   Rectal BID  . insulin aspart  0-15 Units Subcutaneous TID WC  . labetalol  200 mg Oral BID  . [START ON 12/31/2018] lidocaine-prilocaine  1 application Topical Q T,Th,Sa-HD  . mupirocin cream   Topical Daily  . mupirocin ointment  1 application Nasal BID  . saccharomyces boulardii  250 mg Oral Daily  . senna-docusate  1 tablet Oral BID   Continuous Infusions: . sodium chloride    . sodium chloride       LOS: 0 days    Time spent: 35 min    Nicolette Bang, MD [facility]  If 7PM-7AM, please contact night-coverage  12/30/2018, 5:04 PM

## 2018-12-30 NOTE — Progress Notes (Addendum)
[facility] Progress Note   Subjective:  Seen in room - no CP/dyspnea. Reports small dark stool this AM and mild L sided abd pain. GI following - considering small bowel enteroscopy.  Objective Vitals:   12/29/18 1758 12/29/18 2038 12/30/18 0445 12/30/18 0923  BP: (!) 181/79 (!) 168/87 (!) 176/91 (!) 166/125  Pulse: 94 91 88 95  Resp: 18 (!) 24 (!) 21 20  Temp: 97.8 F (36.6 C) (!) 97.5 F (36.4 C) 98.4 F (36.9 C) 98.8 F (37.1 C)  TempSrc: Oral Oral Oral Oral  SpO2: 99% 98% 99% 97%  Weight:  73.6 kg    Height:       Physical Exam General: Well appearing woman, NAD Heart: RRR' 2/6 murmur Lungs: CTAB Abdomen: soft, mildly tender in L abdomen. Extremities: No LE edema Dialysis Access: L forearm AVG + bruit  Additional Objective Labs: Basic Metabolic Panel: Recent Labs  Lab 12/29/18 0823 12/30/18 0540  NA 133* 135  K 6.0* 4.4  CL 93* 95*  CO2 19* 26  GLUCOSE 186* 285*  BUN 151* 60*  CREATININE 9.60* 5.24*  CALCIUM 8.6* 8.9  PHOS  --  4.1   Liver Function Tests: Recent Labs  Lab 12/29/18 0823 12/30/18 0540  AST 58*  --   ALT 56*  --   ALKPHOS 130*  --   BILITOT 0.8  --   PROT 6.5  --   ALBUMIN 3.2* 3.1*   CBC: Recent Labs  Lab 12/29/18 0823 12/29/18 1844 12/30/18 0540  WBC 2.7*  --  3.3*  NEUTROABS 1.7  --   --   HGB 6.4* 9.0* 8.6*  HCT 19.7* 26.1* 25.9*  MCV 105.3*  --  99.2  PLT 93*  --  88*   Medications: . sodium chloride    . sodium chloride     . sodium chloride   Intravenous Once  . [START ON 12/31/2018] allopurinol  100 mg Oral Q T,Th,Sat-1800  . atorvastatin  40 mg Oral QHS  . [START ON 12/31/2018] calcitRIOL  1.75 mcg Oral Q T,Th,Sa-HD  . calcium acetate  1,334 mg Oral TID WC  . Chlorhexidine Gluconate Cloth  6 each Topical Q0600  . Chlorhexidine Gluconate Cloth  6 each Topical Q0600  . darbepoetin (ARANESP) injection - DIALYSIS  200 mcg Intravenous Q Tue-HD  . DULoxetine  90 mg Oral Daily  . ferrous sulfate   325 mg Oral Q breakfast  . folic acid  1 mg Oral Daily  . hydrocortisone   Rectal BID  . insulin aspart  0-15 Units Subcutaneous TID WC  . labetalol  200 mg Oral BID  . [START ON 12/31/2018] lidocaine-prilocaine  1 application Topical Q T,Th,Sa-HD  . mupirocin cream   Topical Daily  . mupirocin ointment  1 application Nasal BID  . saccharomyces boulardii  250 mg Oral Daily  . senna-docusate  1 tablet Oral BID    Dialysis Orders: TTS at [facility] 3:30h, 400/A2.0, EDW 72.5kg, 2K/2Ca, #4, AVG, no heparin - Venofer 100mg  x 10 ordered (4 given so far) - Mircera 252mcg IV q 2 weeks (last given on 4/30 as outpatient). Hgb 10.1, tsat 46% on 5/21. - Calcitrol 65mcg PO q HD  Assessment/Plan: 1. Rectal bleeding/symptomatic anemia: Passing blood since 5/25 - Hgb down to 6.4 on admit. S/p 2U PRBCs, GI following - for possible small bowel enteroscopy. Aranesp 280mcg given 5/26. 2.  Hyperkalemia: K 6 on admit in setting of missed dialysis, improved with HD. 3.  ESRD: Usual TTS schedule - next 5/28. No heparin. 4.  Hypertension/volume: BP still high - will try to inch back to prior EDW. 5.  Metabolic bone disease: Ca/Phos ok. On Phoslo for binder here - at home uses combination Phoslo and [place] - holding [place] as it causes dark stools which may confuse situation. 6.  T2DM 7.  HFrEF 8.  Hep C  9.  Thrombocytopenia: Low but stable.  Veneta Penton, PA-C 12/30/2018, 10:04 AM  [facility] Pager: [phone]  Pt seen, examined and agree w A/P as above.  Kelly Splinter  MD 12/30/2018, 11:39 AM

## 2018-12-30 NOTE — Progress Notes (Signed)
Inpatient Diabetes Program Recommendations  AACE/ADA: New Consensus Statement on Inpatient Glycemic Control (2015)  Target Ranges:  Prepandial:   less than 140 mg/dL      Peak postprandial:   less than 180 mg/dL (1-2 hours)      Critically ill patients:  140 - 180 mg/dL   Results for [name], [name] (MRN [id number]) as of 12/30/2018 10:05  Ref. Range 12/29/2018 09:56 12/29/2018 11:52 12/29/2018 18:18 12/29/2018 20:44  Glucose-Capillary Latest Ref Range: 70 - 99 mg/dL 183 (H)  5 units NOVOLOG + 50 ml D50% 186 (H) 96 143 (H)   Results for [name], [name] (MRN [id number]) as of 12/30/2018 10:05  Ref. Range 12/30/2018 07:00  Glucose-Capillary Latest Ref Range: 70 - 99 mg/dL 219 (H)  5 units NOVOLOG     Admit with: Rectal Bleed/ Symptomatic Anemia (Hemoglobn 6.4 g/dl on admit)/ Hyperkalemia (missed HD on 05/23)  History: DM, ESRD, CHF  Home DM Meds: Levemir 12 units QHS       Novolog 0-9 units TID per SSi  Current Orders: Novolog Moderate Correction Scale/ SSI (0-15 units) TID AC     MD- Note CBG elevated to 219 mg/dl this AM.  Please consider starting 50% home dose of Levemir:  Levemir 6 units QHS     --Will follow patient during hospitalization--  Wyn Quaker RN, MSN, CDE Diabetes Coordinator Inpatient Glycemic Control Team Team Pager: [phone] (8a-5p)

## 2018-12-31 ENCOUNTER — Encounter (HOSPITAL_COMMUNITY)
Admission: EM | Disposition: A | Discharge status: Home Health | Payer: Self-pay | Source: Home / Self Care | Attending: Internal Medicine

## 2018-12-31 ENCOUNTER — Encounter (HOSPITAL_COMMUNITY): Payer: Self-pay | Admitting: Certified Registered Nurse Anesthetist

## 2018-12-31 ENCOUNTER — Inpatient Hospital Stay (HOSPITAL_COMMUNITY): Payer: Medicare Other | Admitting: Anesthesiology

## 2018-12-31 DIAGNOSIS — K921 Melena: Principal | ICD-10-CM

## 2018-12-31 DIAGNOSIS — K922 Gastrointestinal hemorrhage, unspecified: Secondary | ICD-10-CM

## 2018-12-31 HISTORY — PX: ENTEROSCOPY: SHX5533

## 2018-12-31 LAB — CBC WITH DIFFERENTIAL/PLATELET
Abs Immature Granulocytes: 0.05 10*3/uL (ref 0.00–0.07)
Basophils Absolute: 0 10*3/uL (ref 0.0–0.1)
Basophils Relative: 1 %
Eosinophils Absolute: 0.1 10*3/uL (ref 0.0–0.5)
Eosinophils Relative: 3 %
HCT: 21.3 % — ABNORMAL LOW (ref 36.0–46.0)
Hemoglobin: 7.2 g/dL — ABNORMAL LOW (ref 12.0–15.0)
Immature Granulocytes: 1 %
Lymphocytes Relative: 23 %
Lymphs Abs: 0.8 10*3/uL (ref 0.7–4.0)
MCH: 33.2 pg (ref 26.0–34.0)
MCHC: 33.8 g/dL (ref 30.0–36.0)
MCV: 98.2 fL (ref 80.0–100.0)
Monocytes Absolute: 0.5 10*3/uL (ref 0.1–1.0)
Monocytes Relative: 13 %
Neutro Abs: 2.1 10*3/uL (ref 1.7–7.7)
Neutrophils Relative %: 59 %
Platelets: 82 10*3/uL — ABNORMAL LOW (ref 150–400)
RBC: 2.17 MIL/uL — ABNORMAL LOW (ref 3.87–5.11)
RDW: 15.3 % (ref 11.5–15.5)
WBC: 3.6 10*3/uL — ABNORMAL LOW (ref 4.0–10.5)
nRBC: 0 % (ref 0.0–0.2)

## 2018-12-31 LAB — BASIC METABOLIC PANEL
Anion gap: 13 (ref 5–15)
BUN: 76 mg/dL — ABNORMAL HIGH (ref 6–20)
CO2: 24 mmol/L (ref 22–32)
Calcium: 8.9 mg/dL (ref 8.9–10.3)
Chloride: 97 mmol/L — ABNORMAL LOW (ref 98–111)
Creatinine, Ser: 7.04 mg/dL — ABNORMAL HIGH (ref 0.44–1.00)
GFR calc Af Amer: 7 mL/min — ABNORMAL LOW (ref >60)
GFR calc non Af Amer: 6 mL/min — ABNORMAL LOW (ref >60)
Glucose, Bld: 188 mg/dL — ABNORMAL HIGH (ref 70–99)
Potassium: 4.6 mmol/L (ref 3.5–5.1)
Sodium: 134 mmol/L — ABNORMAL LOW (ref 135–145)

## 2018-12-31 LAB — GLUCOSE, CAPILLARY
Glucose-Capillary: 171 mg/dL — ABNORMAL HIGH (ref 70–99)
Glucose-Capillary: 167 mg/dL — ABNORMAL HIGH (ref 70–99)

## 2018-12-31 SURGERY — ENTEROSCOPY
Anesthesia: Monitor Anesthesia Care

## 2018-12-31 MED ORDER — PROPOFOL 500 MG/50ML IV EMUL
INTRAVENOUS | Status: DC | PRN
  Administered 2018-12-31: 75 ug/kg/min via INTRAVENOUS

## 2018-12-31 MED ORDER — PROPOFOL 10 MG/ML IV BOLUS
INTRAVENOUS | Status: DC | PRN
  Administered 2018-12-31: 30 mg via INTRAVENOUS

## 2018-12-31 MED ORDER — LABETALOL HCL 5 MG/ML IV SOLN
10.0000 mg | Freq: Once | INTRAVENOUS | Status: AC
  Administered 2018-12-31: 10 mg via INTRAVENOUS

## 2018-12-31 MED ORDER — FENTANYL CITRATE (PF) 100 MCG/2ML IJ SOLN
INTRAMUSCULAR | Status: DC | PRN
  Administered 2018-12-31: 50 ug via INTRAVENOUS

## 2018-12-31 MED ORDER — SODIUM CHLORIDE 0.9 % IV SOLN
INTRAVENOUS | Status: DC
  Administered 2018-12-31: 09:00:00 via INTRAVENOUS

## 2018-12-31 MED ORDER — LIDOCAINE HCL (CARDIAC) PF 100 MG/5ML IV SOSY
PREFILLED_SYRINGE | INTRAVENOUS | Status: DC | PRN
  Administered 2018-12-31: 100 mg via INTRAVENOUS

## 2018-12-31 MED ORDER — LABETALOL HCL 5 MG/ML IV SOLN
INTRAVENOUS | Status: AC
  Filled 2018-12-31: qty 4

## 2018-12-31 MED ORDER — FENTANYL CITRATE (PF) 100 MCG/2ML IJ SOLN
INTRAMUSCULAR | Status: AC
  Filled 2018-12-31: qty 2

## 2018-12-31 NOTE — Discharge Summary (Signed)
Physician Discharge Summary  [name] NWG:[id number] DOB: [birth year] DOA: 12/29/2018  PCP: Patient, [name]  Admit date: 12/29/2018 Discharge date: 12/31/2018  Admitted From: Inpatient Disposition: home  Recommendations for Outpatient Follow-up:  1. Follow up with PCP in 1-2 weeks 2. Please obtain BMP/CBC in two days   Home Health:No Equipment/Devices:none  Discharge Condition:Stable CODE STATUS:Full code Diet recommendation: Renal diet  Brief/Interim Summary: Admitting H&P: [name] a 53 y.o.femalewith medical history significant ofESRD on HD, Jehovah's Witness, combined CHF, DM type II,hepatitis C,bipolar disorder, PVDs/pleftTMA,anxiety, anemia,and GI bleed;who presents with complaints of rectal bleeding since last night. She reports having 8 episodes of with bleeding dark red blood since last night. Notes that she chronically has pain, but did not associate any pain with bleeding episodes per se. She had been at a nursing facility until 5/22when she moved back home. They did not notified transportation and therefore she missed her hemodialysis session 5/23. She noted having some shortness of breath that she related to the missed hemodialysis session. Other associated symptoms include complaints of constipation, itching, and sores all over her body. She had been utilizing the mupirocincream without relief. At the nursing facility she states her roommate had MRSA and thinks that is what she has as well. She has tried Benadryl and hydroxyzine without relief of symptoms.   During last hospitalization in April patient presented with complaints of GI bleed seen by [facility]. EGD revealed gastritis/duodenitis needing Protonix daily. Colonoscopy revealed hemorrhoids in the perianal exam and multiple polyps removed.   ED Course:Upon admission into the emergency department patient was noted to be afebrile, respirations 9-21, blood pressure elevated up to  169/83, and all other vitals maintained. Labs significant for WBC 2.1, hemoglobin 6.4, platelets 93, potassium 6, BUN 151, creatinine 9.6, and anion gap 21. Patient was typed and screened and ordered to be transfused 2 units of packed red blood cells. [facility] consulted as well as nephrology for need of hemodialysis as she missed her last session.  HOSPITAL COURSE:  Rectal bleeding, symptomatic anemia: Acute on chronic. Hemoglobin down to6.4 g/dL on admission. Reports having several episodes of red blood per rectum as well as being constipated. During last colonoscopy in March patient noted to have hemorrhoids and polyps evaluated were removed.Patient was given 1 dose of Protonix and ordered to be transfused 2 units of packed red blood cells. She denies any further reports of bleeding since admission. -Monitored intake and output while in the hospital -diet advanced to renal diet without complication -Trend hemoglobin showed mod decline, although, GI consultation did not identify bleeding on eval.  I discussed with the patient remaining in the hospital for additional monitoring but she was unwilling to remain, rec'd close f/up   ESRD on HD: Patientnormally dialyzes Tuesday, Thursday, and Saturday. Reportedly missed her last session of hemodialysison 5/23. Potassium elevated at 6. Dr. Langston Reusing consulted and dialyzed patient. -HD per nephrology as an outpt, she declined her HD today in the hospital  Hyperkalemia: Acute. Initial potassium noted to be elevated at 6 with signs of ST wave changes. Patient was given insulin and calcium gluconate. Patient taken for hemodialysis. -Continue to monitor, 4.2 today  Pruritic rash, diabetic ulcers with possible calciphylaxisPatient noted to have multiple ulcers of her skin. Has been trying mupirocincream without relief. 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 Admission QTc 508. -Correct electrolyte abnormality -RecheckED EKG in a.m.  QTC now 486  Left ear pain: Unclear cause of ear pain and discomfort. Unable to be assessed with otoscope.  Patient reports it drained several days ago had no pain and no complications since  Chronic combined CHF: Last EF 45 to 50% with grade 2 diastolic dysfunction. Patient appears to be fluid overloaded on admission needing dialysis. -volume removed by HD, will need to manage fluid status closely as an outpt  Thrombocytopenia: no evidence of bleeding, will need close outpt followup as above and as discussed with the patient  Essential hypertension: Blood pressures mildly elevated -Continue labetalol  Diabetes mellitus type 2:Last hemoglobin A1c 5.9. At home patient normally takes Levemir 12 units nightlyand NovoLog 9 units with meals. -Hypoglycemic protocol while in the hospital -CBGs q. before meals with moderate SSI -Resume home meds on discharge  Anxiety/depression -Continue Cymbalta,Xanax as needed, defer titration of medication to her outpt mental health provider  Chronic pain -Continue oxycodone as needed, again further management thru her outpt provider  Hyperlipidemia -Continue atorvastatin, renal heart healthy diet - surveillance thru her outpt. provider  Discharge Diagnoses:  Principal Problem:   Rectal bleeding Active Problems:   Anemia, iron deficiency   Essential hypertension   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 (HCC)   Chronic pain syndrome   Hyperkalemia   ESRD on dialysis ([place])   Prolonged QT interval   Rectal bleed   Melena    Discharge Instructions  Discharge Instructions    Call MD for:   Complete by:  As directed    For any acute change in medical condition   Diet - low sodium heart healthy   Complete by:  As directed    Increase activity slowly   Complete by:  As directed      Allergies  as of 12/31/2018      Reactions   Penicillins Hives, Other (See Comments)   Has patient had a PCN reaction causing immediate rash, facial/tongue/throat swelling, SOB or lightheadedness with hypotension: Yes Has patient had a PCN reaction causing severe rash involving mucus membranes or skin necrosis: No Has patient had a PCN reaction that required hospitalization No Has patient had a PCN reaction occurring within the last 10 years: No If all of the above answers are "NO", then may proceed with Cephalosporin use. Pt tolerates cefepime and ceftriaxone   Gabapentin Itching, Swelling   Lyrica [pregabalin] Swelling, Other (See Comments)   Facial and leg swelling   Saphris [asenapine] Swelling, Other (See Comments)   Facial swelling   Tramadol Swelling, Other (See Comments)   Leg swelling   Adhesive [tape] Itching   SEVERE ITCHING   Latex Itching   SEVERE ITCHING   Other Other (See Comments)   NO BLOOD PRODUCTS; PATIENT IS A JEHOVAH'S WITNESS      Medication List    TAKE these medications   albuterol 108 (90 Base) MCG/ACT inhaler Commonly known as:  VENTOLIN HFA Inhale 2 puffs into the lungs every 4 (four) hours as needed for wheezing or shortness of breath.   allopurinol 100 MG tablet Commonly known as:  ZYLOPRIM Take 1 tablet (100 mg total) by mouth every Tuesday, Thursday, and Saturday at 6 PM.   ALPRAZolam 0.5 MG tablet Commonly known as:  XANAX Take 1 tablet (0.5 mg total) by mouth every 8 (eight) hours as needed for anxiety.   amLODipine 10 MG tablet Commonly known as:  NORVASC Take 1 tablet (10 mg total) by mouth at bedtime.   atorvastatin 40  MG tablet Commonly known as:  LIPITOR Take 1 tablet (40 mg total) by mouth at bedtime.   bisacodyl 10 MG suppository Commonly known as:  DULCOLAX Place 10 mg rectally daily as needed (for constipation).   calcitRIOL 0.25 MCG capsule Commonly known as:  ROCALTROL Take 7 capsules (1.75 mcg total) by mouth every dialysis (3  times per week on TTS).   calcium acetate 667 MG capsule Commonly known as:  PHOSLO Take 2 capsules (1,334 mg total) by mouth 3 (three) times daily with meals.   CEROVITE SENIOR PO Take 1 tablet by mouth at bedtime.   diphenhydrAMINE 25 mg capsule Commonly known as:  BENADRYL Take 25 mg by mouth every 6 (six) hours as needed for itching.   docusate sodium 100 MG capsule Commonly known as:  COLACE Take 100 mg by mouth 2 (two) times daily.   DULoxetine 30 MG capsule Commonly known as:  CYMBALTA Take 30 mg by mouth daily. Take with 60 for a total of 90   DULoxetine 60 MG capsule Commonly known as:  CYMBALTA Take 60 mg by mouth daily. Take one capsule with 30mg  to equal 90mg .   epoetin alfa 20000 UNIT/ML injection Commonly known as:  EPOGEN Inject 1 mL (20,000 Units total) into the vein Every Tuesday,Thursday,and Saturday with dialysis.   Ferrous Sulfate 142 (45 Fe) MG Tbcr Commonly known as:  Slow Fe Take 1 tablet by mouth daily.   folic acid 1 MG tablet Commonly known as:  FOLVITE Take 1 tablet (1 mg total) by mouth daily.   hydrocortisone 2.5 % rectal cream Commonly known as:  ANUSOL-HC Place rectally 2 (two) times daily.   insulin aspart 100 UNIT/ML injection Commonly known as:  novoLOG 0-9 Units, Subcutaneous, 3 times daily with meals CBG < 70: implement hypoglycemia protocol CBG 70 - 120: 0 units CBG 121 - 150: 1 unit CBG 151 - 200: 2 units CBG 201 - 250: 3 units CBG 251 - 300: 5 units CBG 301 - 350: 7 units CBG 351 - 400: 9 units CBG > 400: call MD   Insulin Detemir 100 UNIT/ML Pen Commonly known as:  Levemir FlexTouch Inject 12 Units into the skin at bedtime.   labetalol 200 MG tablet Commonly known as:  NORMODYNE Take 1 tablet (200 mg total) by mouth 2 (two) times daily.   lidocaine-prilocaine cream Commonly known as:  EMLA Apply 1 application topically See admin instructions. On dialysis days   methocarbamol 500 MG tablet Commonly known as:   ROBAXIN Take 500 mg by mouth 3 (three) times daily as needed for muscle spasms.   multivitamin Tabs tablet Take 1 tablet by mouth at bedtime.   mupirocin ointment 2 % Commonly known as:  BACTROBAN Apply 1 application topically as needed (on arm).   ondansetron 4 MG tablet Commonly known as:  ZOFRAN Take 4 mg by mouth every 8 (eight) hours as needed for nausea or vomiting.   Oxycodone HCl 10 MG Tabs Take 1 tablet (10 mg total) by mouth every 6 (six) hours as needed (for pain).   pantoprazole 40 MG tablet Commonly known as:  PROTONIX Take 1 tablet (40 mg total) by mouth daily at 6 (six) AM.   saccharomyces boulardii 250 MG capsule Commonly known as:  FLORASTOR Take 250 mg by mouth daily.   senna 8.6 MG tablet Commonly known as:  SENOKOT Take 2 tablets by mouth daily.   traZODone 50 MG tablet Commonly known as:  DESYREL Take 0.5 tablets (25  mg total) by mouth at bedtime. What changed:  how much to take       Allergies  Allergen Reactions  . Penicillins Hives and Other (See Comments)    Has patient had a PCN reaction causing immediate rash, facial/tongue/throat swelling, SOB or lightheadedness with hypotension: Yes Has patient had a PCN reaction causing severe rash involving mucus membranes or skin necrosis: No Has patient had a PCN reaction that required hospitalization No Has patient had a PCN reaction occurring within the last 10 years: No If all of the above answers are "NO", then may proceed with Cephalosporin use. Pt tolerates cefepime and ceftriaxone  . Gabapentin Itching and Swelling  . Lyrica [Pregabalin] Swelling and Other (See Comments)    Facial and leg swelling  . Saphris [Asenapine] Swelling and Other (See Comments)    Facial swelling  . Tramadol Swelling and Other (See Comments)    Leg swelling  . Adhesive [Tape] Itching    SEVERE ITCHING  . Latex Itching    SEVERE ITCHING  . Other Other (See Comments)    NO BLOOD PRODUCTS; PATIENT IS A JEHOVAH'S  WITNESS    Consultations:  gi   Procedures/Studies:  No results found.    Subjective:   Discharge Exam: Vitals:   12/31/18 1027 12/31/18 1050  BP: (!) 153/59 (!) 160/74  Pulse: 81 79  Resp: 16 18  Temp:  98.2 F (36.8 C)  SpO2: 96% 100%   Vitals:   12/31/18 1006 12/31/18 1016 12/31/18 1027 12/31/18 1050  BP: (!) 127/51 (!) 141/54 (!) 153/59 (!) 160/74  Pulse:  82 81 79  Resp: (!) 27 (!) 29 16 18   Temp: 97.8 F (36.6 C)   98.2 F (36.8 C)  TempSrc: Oral   Oral  SpO2: 100% 94% 96% 100%  Weight:      Height:        General: Pt is alert, awake, not in acute distress Cardiovascular: RRR, S1/S2 +, no rubs, no gallops Respiratory: CTA bilaterally, no wheezing, no rhonchi Abdominal: Soft, NT, ND, bowel sounds + Extremities: no edema, no cyanosis Skin; multiple lesions as previously described, no evid of acute infection, consistent with known calciphylaxis    The results of significant diagnostics from this hospitalization (including imaging, microbiology, ancillary and laboratory) are listed below for reference.     Microbiology: Recent Results (from the past 240 hour(s))  SARS Coronavirus 2 (CEPHEID - Performed in [place] hospital lab), Hosp Order     Status: None   Collection Time: 12/29/18 10:53 AM  Result Value Ref Range Status   SARS Coronavirus 2 NEGATIVE NEGATIVE Final    Comment: (NOTE) If result is NEGATIVE SARS-CoV-2 target nucleic acids are NOT DETECTED. The SARS-CoV-2 RNA is generally detectable in upper and lower  respiratory specimens during the acute phase of infection. The lowest  concentration of SARS-CoV-2 viral copies this assay can detect is 250  copies / mL. A negative result does not preclude SARS-CoV-2 infection  and should not be used as the sole basis for treatment or other  patient management decisions.  A negative result may occur with  improper specimen collection / handling, submission of specimen other  than nasopharyngeal  swab, presence of viral mutation(s) within the  areas targeted by this assay, and inadequate number of viral copies  (<250 copies / mL). A negative result must be combined with clinical  observations, patient history, and epidemiological information. If result is POSITIVE SARS-CoV-2 target nucleic acids are DETECTED. The  SARS-CoV-2 RNA is generally detectable in upper and lower  respiratory specimens dur ing the acute phase of infection.  Positive  results are indicative of active infection with SARS-CoV-2.  Clinical  correlation with patient history and other diagnostic information is  necessary to determine patient infection status.  Positive results do  not rule out bacterial infection or co-infection with other viruses. If result is PRESUMPTIVE POSTIVE SARS-CoV-2 nucleic acids MAY BE PRESENT.   A presumptive positive result was obtained on the submitted specimen  and confirmed on repeat testing.  While 2019 novel coronavirus  (SARS-CoV-2) nucleic acids may be present in the submitted sample  additional confirmatory testing may be necessary for epidemiological  and / or clinical management purposes  to differentiate between  SARS-CoV-2 and other Sarbecovirus currently known to infect humans.  If clinically indicated additional testing with an alternate test  methodology [phone]) is advised. The SARS-CoV-2 RNA is generally  detectable in upper and lower respiratory sp ecimens during the acute  phase of infection. The expected result is Negative. Fact Sheet for Patients:  StrictlyIdeas.no Fact Sheet for Healthcare Providers: BankingDealers.co.za This test is not yet approved or cleared by the [place] FDA and has been authorized for detection and/or diagnosis of SARS-CoV-2 by FDA under an Emergency Use Authorization (EUA).  This EUA will remain in effect (meaning this test can be used) for the duration of the COVID-19 declaration  under Section 564(b)(1) of the Act, 21 U.S.C. section 360bbb-3(b)(1), unless the authorization is terminated or revoked sooner. Performed at [facility], [place] [address], [place], [place] [address]   MRSA PCR Screening     Status: Abnormal   Collection Time: 12/29/18  8:29 PM  Result Value Ref Range Status   MRSA by PCR POSITIVE (A) NEGATIVE Final    Comment:        The GeneXpert MRSA Assay (FDA approved for NASAL specimens only), is one component of a comprehensive MRSA colonization surveillance program. It is not intended to diagnose MRSA infection nor to guide or monitor treatment for MRSA infections. RESULT CALLED TO, READ BACK BY AND VERIFIED WITHBarnabas Harries RN 12/30/18 0011 JDW Performed at [facility], [place] [address], [place], [place] [address]      Labs: BNP (last 3 results) No results for input(s): BNP in the last 8760 hours. Basic Metabolic Panel: Recent Labs  Lab 12/29/18 0823 12/30/18 0540 12/31/18 0506  NA 133* 135 134*  K 6.0* 4.4 4.6  CL 93* 95* 97*  CO2 19* 26 24  GLUCOSE 186* 285* 188*  BUN 151* 60* 76*  CREATININE 9.60* 5.24* 7.04*  CALCIUM 8.6* 8.9 8.9  PHOS  --  4.1  --    Liver Function Tests: Recent Labs  Lab 12/29/18 0823 12/30/18 0540  AST 58*  --   ALT 56*  --   ALKPHOS 130*  --   BILITOT 0.8  --   PROT 6.5  --   ALBUMIN 3.2* 3.1*   No results for input(s): LIPASE, AMYLASE in the last 168 hours. No results for input(s): AMMONIA in the last 168 hours. CBC: Recent Labs  Lab 12/29/18 0823 12/29/18 1844 12/30/18 0540 12/31/18 0506  WBC 2.7*  --  3.3* 3.6*  NEUTROABS 1.7  --   --  2.1  HGB 6.4* 9.0* 8.6* 7.2*  HCT 19.7* 26.1* 25.9* 21.3*  MCV 105.3*  --  99.2 98.2  PLT 93*  --  88* 82*   Cardiac Enzymes:  No results for input(s): CKTOTAL, CKMB, CKMBINDEX, TROPONINI in the last 168 hours. BNP: Invalid input(s): POCBNP CBG: Recent Labs  Lab 12/30/18 1121 12/30/18 1624 12/30/18 2111 12/31/18 0619  12/31/18 1107  GLUCAP 145* 104* 208* 171* 167*   D-Dimer No results for input(s): DDIMER in the last 72 hours. Hgb A1c No results for input(s): HGBA1C in the last 72 hours. Lipid Profile No results for input(s): CHOL, HDL, LDLCALC, TRIG, CHOLHDL, LDLDIRECT in the last 72 hours. Thyroid function studies No results for input(s): TSH, T4TOTAL, T3FREE, THYROIDAB in the last 72 hours.  Invalid input(s): FREET3 Anemia work up No results for input(s): VITAMINB12, FOLATE, FERRITIN, TIBC, IRON, RETICCTPCT in the last 72 hours. Urinalysis    Component Value Date/Time   COLORURINE YELLOW 02/25/2017 1224   APPEARANCEUR HAZY (A) 02/25/2017 1224   LABSPEC 1.013 02/25/2017 1224   PHURINE 8.0 02/25/2017 1224   GLUCOSEU >=500 (A) 02/25/2017 1224   HGBUR SMALL (A) 02/25/2017 1224   BILIRUBINUR NEGATIVE 02/25/2017 1224   BILIRUBINUR neg 12/13/2014 1127   KETONESUR NEGATIVE 02/25/2017 1224   PROTEINUR >=300 (A) 02/25/2017 1224   UROBILINOGEN 0.2 05/01/2015 1252   NITRITE NEGATIVE 02/25/2017 1224   LEUKOCYTESUR NEGATIVE 02/25/2017 1224   Sepsis Labs Invalid input(s): PROCALCITONIN,  WBC,  LACTICIDVEN Microbiology Recent Results (from the past 240 hour(s))  SARS Coronavirus 2 (CEPHEID - Performed in [place] hospital lab), Hosp Order     Status: None   Collection Time: 12/29/18 10:53 AM  Result Value Ref Range Status   SARS Coronavirus 2 NEGATIVE NEGATIVE Final    Comment: (NOTE) If result is NEGATIVE SARS-CoV-2 target nucleic acids are NOT DETECTED. The SARS-CoV-2 RNA is generally detectable in upper and lower  respiratory specimens during the acute phase of infection. The lowest  concentration of SARS-CoV-2 viral copies this assay can detect is 250  copies / mL. A negative result does not preclude SARS-CoV-2 infection  and should not be used as the sole basis for treatment or other  patient management decisions.  A negative result may occur with  improper specimen collection /  handling, submission of specimen other  than nasopharyngeal swab, presence of viral mutation(s) within the  areas targeted by this assay, and inadequate number of viral copies  (<250 copies / mL). A negative result must be combined with clinical  observations, patient history, and epidemiological information. If result is POSITIVE SARS-CoV-2 target nucleic acids are DETECTED. The SARS-CoV-2 RNA is generally detectable in upper and lower  respiratory specimens dur ing the acute phase of infection.  Positive  results are indicative of active infection with SARS-CoV-2.  Clinical  correlation with patient history and other diagnostic information is  necessary to determine patient infection status.  Positive results do  not rule out bacterial infection or co-infection with other viruses. If result is PRESUMPTIVE POSTIVE SARS-CoV-2 nucleic acids MAY BE PRESENT.   A presumptive positive result was obtained on the submitted specimen  and confirmed on repeat testing.  While 2019 novel coronavirus  (SARS-CoV-2) nucleic acids may be present in the submitted sample  additional confirmatory testing may be necessary for epidemiological  and / or clinical management purposes  to differentiate between  SARS-CoV-2 and other Sarbecovirus currently known to infect humans.  If clinically indicated additional testing with an alternate test  methodology [phone]) is advised. The SARS-CoV-2 RNA is generally  detectable in upper and lower respiratory sp ecimens during the acute  phase of infection. The expected result is Negative. Fact  Sheet for Patients:  StrictlyIdeas.no Fact Sheet for Healthcare Providers: BankingDealers.co.za This test is not yet approved or cleared by the [place] FDA and has been authorized for detection and/or diagnosis of SARS-CoV-2 by FDA under an Emergency Use Authorization (EUA).  This EUA will remain in effect (meaning this  test can be used) for the duration of the COVID-19 declaration under Section 564(b)(1) of the Act, 21 U.S.C. section 360bbb-3(b)(1), unless the authorization is terminated or revoked sooner. Performed at [facility], [place] [address] [place], [place] [address]   MRSA PCR Screening     Status: Abnormal   Collection Time: 12/29/18  8:29 PM  Result Value Ref Range Status   MRSA by PCR POSITIVE (A) NEGATIVE Final    Comment:        The GeneXpert MRSA Assay (FDA approved for NASAL specimens only), is one component of a comprehensive MRSA colonization surveillance program. It is not intended to diagnose MRSA infection nor to guide or monitor treatment for MRSA infections. RESULT CALLED TO, READ BACK BY AND VERIFIED WITHBarnabas Harries RN 12/30/18 0011 JDW Performed at [facility], [place] [address], [place], [place] [address]      Time coordinating discharge:35  minutes  SIGNED:   Nicolette Bang, MD  [facility] 12/31/2018, 11:32 AM Pager   If 7PM-7AM, please contact night-coverage [email or web address] Password [email or web address]

## 2018-12-31 NOTE — Interval H&P Note (Signed)
History and Physical Interval Note:  12/31/2018 9:36 AM  [name]  has presented today for surgery, with the diagnosis of Gastrointestinal bleeding.  The various methods of treatment have been discussed with the patient and family. After consideration of risks, benefits and other options for treatment, the patient has consented to  Procedure(s) with comments: ENTEROSCOPY (N/A) - Push enteroscopy at 8am (spoke with Endo) as a surgical intervention.  The patient's history has been reviewed, patient examined, no change in status, stable for surgery.  I have reviewed the patient's chart and labs.  Questions were answered to the patient's satisfaction.     Scarlette Shorts

## 2018-12-31 NOTE — Op Note (Signed)
[facility] Patient Name: [name] Procedure Date : 12/31/2018 MRN: [id number] Attending MD: [name]. Henrene Pastor , MD Date of Birth: 11/25/1965 CSN: [id number] Age: 53 Admit Type: Inpatient Procedure:                Small bowel enteroscopy Indications:              GI bleeding source not documented by previous EGD                            and colonoscopy Providers:                [name]. Henrene Pastor, MD, Dortha Schwalbe RN, RN,                            William Dalton, Technician, Raphael Gibney, CRNA Referring MD:             [facility] hospital[facility] Medicines:                Monitored Anesthesia Care Complications:            No immediate complications. Estimated Blood Loss:     Estimated blood loss: none. Procedure:                Pre-Anesthesia Assessment:                           - Prior to the procedure, a History and Physical                            was performed, and patient medications and                            allergies were reviewed. The patient is competent.                            The risks and benefits of the procedure and the                            sedation options and risks were discussed with the                            patient. All questions were answered and informed                            consent was obtained. Patient identification and                            proposed procedure were verified by the physician                            in the pre-procedure area. Mental Status                            Examination: alert and oriented. Airway  Examination: normal oropharyngeal airway and neck                            mobility. Respiratory Examination: clear to                            auscultation. CV Examination: normal. Prophylactic                            Antibiotics: The patient does not require                            prophylactic antibiotics. Prior Anticoagulants: The   patient has taken no previous anticoagulant or                            antiplatelet agents. ASA Grade Assessment: III - A                            patient with severe systemic disease. After                            reviewing the risks and benefits, the patient was                            deemed in satisfactory condition to undergo the                            procedure. The anesthesia plan was to use deep                            sedation / analgesia. Immediately prior to                            administration of medications, the patient was                            re-assessed for adequacy to receive sedatives. The                            heart rate, respiratory rate, oxygen saturations,                            blood pressure, adequacy of pulmonary ventilation,                            and response to care were monitored throughout the                            procedure. The physical status of the patient was                            re-assessed after the procedure.  After obtaining informed consent, the endoscope was                            passed under direct vision. Throughout the                            procedure, the patient's blood pressure, pulse, and                            oxygen saturations were monitored continuously. The                            PCF-H190DL ([id number] Olympus pediatric colonscope                            was introduced through the mouth and advanced to                            the proximal jejunum. The small bowel enteroscopy                            was accomplished without difficulty. The patient                            tolerated the procedure well. Scope In: Scope Out: Findings:      The esophagus was normal.      The stomach was normal.      There was no evidence of significant pathology in the entire examined       duodenum.      There was no evidence of significant pathology in  the proximal jejunum.       Incidental noninflamed polypoid mucosa noted. Impression:               1. Unremarkable push enteroscopy to the proximal                            jejunum. No active bleeding or identified pathology                           2. Colonoscopy and upper endoscopy November 18, 2018                            without relevant pathology                           3. Currently without active GI bleeding. Recommendation:           1. Resume previous diet                           2. Okay for discharge from GI standpoint                           3. Should patient present with recurrent acute  bleeding, we would recommend proceeding with CT                            Angiography                           4. Additional consideration should she have                            recurrent bleeding would be complete capsule                            endoscopy.                           5. Okay for discharge from GI perspective. Case has                            been discussed with the patient who was also                            provided a copy of her procedure report. GI will                            sign off.. Procedure Code(s):        --- Professional ---                           [phone], Small intestinal endoscopy, enteroscopy                            beyond second portion of duodenum, not including                            ileum; diagnostic, including collection of                            specimen(s) by brushing or washing, when performed                            (separate procedure) Diagnosis Code(s):        --- Professional ---                           K92.2, Gastrointestinal hemorrhage, unspecified CPT copyright 2019 [employer]. All rights reserved. The codes documented in this report are preliminary and upon coder review may  be revised to meet current compliance requirements. [name]. Henrene Pastor, MD 12/31/2018  10:20:35 AM This report has been signed electronically. Number of Addenda: 0

## 2018-12-31 NOTE — Progress Notes (Signed)
DISCHARGE NOTE HOME [name] to be discharged to home per MD order. Discussed prescriptions and follow up appointments with the patient. Prescriptions given to patient; medication list explained in detail. Patient verbalized understanding.Patient refused to go on her scheduled hemodialysis today,she told her nurse that she is going tomorrow for her hemodialysis which is not her regular schedule days of HD and the nurse explained to her that.She is alert and oriented x4.PMD aware.  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 Site without signs and symptoms of complications. Dressing and pressure applied. Pt denies pain at the site currently. No complaints noted.  Patient free of lines, drains, and wounds.   An After Visit Summary (AVS) was printed and given to the patient. Patient escorted via wheelchair, and discharged home via private auto.  [place], Zenon Mayo, RN

## 2018-12-31 NOTE — Anesthesia Postprocedure Evaluation (Signed)
Anesthesia Post Note  Patient: [name]  Procedure(s) Performed: ENTEROSCOPY (N/A )     Patient location during evaluation: PACU Anesthesia Type: MAC Level of consciousness: awake and alert Pain management: pain level controlled Vital Signs Assessment: post-procedure vital signs reviewed and stable Respiratory status: spontaneous breathing, nonlabored ventilation and respiratory function stable Cardiovascular status: blood pressure returned to baseline and stable Postop Assessment: no apparent nausea or vomiting Anesthetic complications: no    Last Vitals:  Vitals:   12/31/18 1027 12/31/18 1050  BP: (!) 153/59 (!) 160/74  Pulse: 81 79  Resp: 16 18  Temp:  36.8 C  SpO2: 96% 100%    Last Pain:  Vitals:   12/31/18 1050  TempSrc: Oral  PainSc:                  Brennan Bailey

## 2018-12-31 NOTE — Progress Notes (Signed)
Patient refused telemetry ,post explained the importance of it.

## 2018-12-31 NOTE — Transfer of Care (Signed)
Immediate Anesthesia Transfer of Care Note  Patient: [name]  Procedure(s) Performed: ENTEROSCOPY (N/A )  Patient Location: Endoscopy Unit  Anesthesia Type:MAC  Level of Consciousness: drowsy  Airway & Oxygen Therapy: Patient Spontanous Breathing and Patient connected to nasal cannula oxygen  Post-op Assessment: Report given to RN, Post -op Vital signs reviewed and stable and Patient moving all extremities  Post vital signs: Reviewed and stable  Last Vitals:  Vitals Value Taken Time  BP 129/51 12/31/2018 10:06 AM  Temp 36.6 C 12/31/2018 10:06 AM  Pulse 80 12/31/2018 10:08 AM  Resp 18 12/31/2018 10:08 AM  SpO2 100 % 12/31/2018 10:08 AM  Vitals shown include unvalidated device data.  Last Pain:  Vitals:   12/31/18 1006  TempSrc: Oral  PainSc: 0-No pain      Patients Stated Pain Goal: 3 (79/15/04 1364)  Complications: No apparent anesthesia complications

## 2019-01-01 ENCOUNTER — Encounter (HOSPITAL_COMMUNITY): Payer: Self-pay | Admitting: Internal Medicine

## 2019-01-01 LAB — NOVEL CORONAVIRUS, NAA (HOSP ORDER, SEND-OUT TO REF LAB; TAT 18-24 HRS): SARS-CoV-2, NAA: NOT DETECTED

## 2019-01-04 ENCOUNTER — Telehealth: Payer: Self-pay

## 2019-01-04 NOTE — Telephone Encounter (Signed)
-----   Message from Dow Adolph sent at 12/31/2018  8:27 AM EDT ----- Hulen Skains and LM on Vmail to CB to schedule ----- Message ----- From: Greggory Keen, LPN Sent: 8/81/1031   5:02 PM EDT To: Donella Stade  Can you please take this one from me? ----- Message ----- From: Willia Craze, NP Sent: 12/30/2018  10:32 AM EDT To: Greggory Keen, LPN  Beth, please call patient tomorrow or Friday with a 10-14 day hospital follow up with Dr. Henrene Pastor for pancreatitis. Thanks

## 2019-01-20 ENCOUNTER — Inpatient Hospital Stay (HOSPITAL_COMMUNITY)
Admission: EM | Admit: 2019-01-20 | Discharge: 2019-01-26 | DRG: 377 | Disposition: A | Discharge status: Home Health | Payer: Medicare Other | Attending: Internal Medicine | Admitting: Internal Medicine

## 2019-01-20 ENCOUNTER — Other Ambulatory Visit: Payer: Self-pay

## 2019-01-20 DIAGNOSIS — K7581 Nonalcoholic steatohepatitis (NASH): Secondary | ICD-10-CM | POA: Diagnosis present

## 2019-01-20 DIAGNOSIS — I5042 Chronic combined systolic (congestive) and diastolic (congestive) heart failure: Secondary | ICD-10-CM | POA: Diagnosis present

## 2019-01-20 DIAGNOSIS — B1920 Unspecified viral hepatitis C without hepatic coma: Secondary | ICD-10-CM | POA: Diagnosis present

## 2019-01-20 DIAGNOSIS — D649 Anemia, unspecified: Secondary | ICD-10-CM | POA: Diagnosis not present

## 2019-01-20 DIAGNOSIS — F319 Bipolar disorder, unspecified: Secondary | ICD-10-CM | POA: Diagnosis present

## 2019-01-20 DIAGNOSIS — H409 Unspecified glaucoma: Secondary | ICD-10-CM | POA: Diagnosis present

## 2019-01-20 DIAGNOSIS — E1140 Type 2 diabetes mellitus with diabetic neuropathy, unspecified: Secondary | ICD-10-CM | POA: Diagnosis present

## 2019-01-20 DIAGNOSIS — K219 Gastro-esophageal reflux disease without esophagitis: Secondary | ICD-10-CM | POA: Diagnosis present

## 2019-01-20 DIAGNOSIS — Z888 Allergy status to other drugs, medicaments and biological substances status: Secondary | ICD-10-CM | POA: Diagnosis not present

## 2019-01-20 DIAGNOSIS — Z9119 Patient's noncompliance with other medical treatment and regimen: Secondary | ICD-10-CM

## 2019-01-20 DIAGNOSIS — N186 End stage renal disease: Secondary | ICD-10-CM | POA: Diagnosis present

## 2019-01-20 DIAGNOSIS — R9431 Abnormal electrocardiogram [ECG] [EKG]: Secondary | ICD-10-CM | POA: Diagnosis present

## 2019-01-20 DIAGNOSIS — Z992 Dependence on renal dialysis: Secondary | ICD-10-CM | POA: Diagnosis not present

## 2019-01-20 DIAGNOSIS — D61818 Other pancytopenia: Secondary | ICD-10-CM | POA: Diagnosis present

## 2019-01-20 DIAGNOSIS — K922 Gastrointestinal hemorrhage, unspecified: Secondary | ICD-10-CM | POA: Diagnosis present

## 2019-01-20 DIAGNOSIS — D62 Acute posthemorrhagic anemia: Secondary | ICD-10-CM | POA: Diagnosis present

## 2019-01-20 DIAGNOSIS — F17210 Nicotine dependence, cigarettes, uncomplicated: Secondary | ICD-10-CM | POA: Diagnosis present

## 2019-01-20 DIAGNOSIS — E1122 Type 2 diabetes mellitus with diabetic chronic kidney disease: Secondary | ICD-10-CM | POA: Diagnosis present

## 2019-01-20 DIAGNOSIS — Z833 Family history of diabetes mellitus: Secondary | ICD-10-CM | POA: Diagnosis not present

## 2019-01-20 DIAGNOSIS — Z8249 Family history of ischemic heart disease and other diseases of the circulatory system: Secondary | ICD-10-CM | POA: Diagnosis not present

## 2019-01-20 DIAGNOSIS — I132 Hypertensive heart and chronic kidney disease with heart failure and with stage 5 chronic kidney disease, or end stage renal disease: Secondary | ICD-10-CM | POA: Diagnosis present

## 2019-01-20 DIAGNOSIS — Z20828 Contact with and (suspected) exposure to other viral communicable diseases: Secondary | ICD-10-CM | POA: Diagnosis present

## 2019-01-20 DIAGNOSIS — D631 Anemia in chronic kidney disease: Secondary | ICD-10-CM | POA: Diagnosis present

## 2019-01-20 DIAGNOSIS — E785 Hyperlipidemia, unspecified: Secondary | ICD-10-CM | POA: Diagnosis present

## 2019-01-20 DIAGNOSIS — Z794 Long term (current) use of insulin: Secondary | ICD-10-CM | POA: Diagnosis not present

## 2019-01-20 DIAGNOSIS — K644 Residual hemorrhoidal skin tags: Secondary | ICD-10-CM | POA: Diagnosis present

## 2019-01-20 DIAGNOSIS — G894 Chronic pain syndrome: Secondary | ICD-10-CM | POA: Diagnosis present

## 2019-01-20 DIAGNOSIS — K449 Diaphragmatic hernia without obstruction or gangrene: Secondary | ICD-10-CM | POA: Diagnosis present

## 2019-01-20 DIAGNOSIS — F419 Anxiety disorder, unspecified: Secondary | ICD-10-CM | POA: Diagnosis present

## 2019-01-20 DIAGNOSIS — Z880 Allergy status to penicillin: Secondary | ICD-10-CM

## 2019-01-20 DIAGNOSIS — D696 Thrombocytopenia, unspecified: Secondary | ICD-10-CM

## 2019-01-20 DIAGNOSIS — I10 Essential (primary) hypertension: Secondary | ICD-10-CM | POA: Diagnosis not present

## 2019-01-20 DIAGNOSIS — K921 Melena: Principal | ICD-10-CM | POA: Diagnosis present

## 2019-01-20 DIAGNOSIS — E875 Hyperkalemia: Secondary | ICD-10-CM | POA: Diagnosis present

## 2019-01-20 DIAGNOSIS — E7800 Pure hypercholesterolemia, unspecified: Secondary | ICD-10-CM | POA: Diagnosis present

## 2019-01-20 DIAGNOSIS — M898X9 Other specified disorders of bone, unspecified site: Secondary | ICD-10-CM | POA: Diagnosis present

## 2019-01-20 DIAGNOSIS — Z8673 Personal history of transient ischemic attack (TIA), and cerebral infarction without residual deficits: Secondary | ICD-10-CM | POA: Diagnosis not present

## 2019-01-20 DIAGNOSIS — Z531 Procedure and treatment not carried out because of patient's decision for reasons of belief and group pressure: Secondary | ICD-10-CM | POA: Diagnosis present

## 2019-01-20 DIAGNOSIS — N2581 Secondary hyperparathyroidism of renal origin: Secondary | ICD-10-CM | POA: Diagnosis present

## 2019-01-20 DIAGNOSIS — Z91040 Latex allergy status: Secondary | ICD-10-CM

## 2019-01-20 LAB — COMPREHENSIVE METABOLIC PANEL
ALT: 45 U/L — ABNORMAL HIGH (ref 0–44)
AST: 47 U/L — ABNORMAL HIGH (ref 15–41)
Albumin: 3.3 g/dL — ABNORMAL LOW (ref 3.5–5.0)
Alkaline Phosphatase: 133 U/L — ABNORMAL HIGH (ref 38–126)
Anion gap: 18 — ABNORMAL HIGH (ref 5–15)
BUN: 116 mg/dL — ABNORMAL HIGH (ref 6–20)
CO2: 24 mmol/L (ref 22–32)
Calcium: 9.3 mg/dL (ref 8.9–10.3)
Chloride: 92 mmol/L — ABNORMAL LOW (ref 98–111)
Creatinine, Ser: 8.54 mg/dL — ABNORMAL HIGH (ref 0.44–1.00)
GFR calc Af Amer: 6 mL/min — ABNORMAL LOW (ref >60)
GFR calc non Af Amer: 5 mL/min — ABNORMAL LOW (ref >60)
Glucose, Bld: 177 mg/dL — ABNORMAL HIGH (ref 70–99)
Potassium: 6.4 mmol/L (ref 3.5–5.1)
Sodium: 134 mmol/L — ABNORMAL LOW (ref 135–145)
Total Bilirubin: 0.8 mg/dL (ref 0.3–1.2)
Total Protein: 6.9 g/dL (ref 6.5–8.1)

## 2019-01-20 LAB — CBC WITH DIFFERENTIAL/PLATELET
Abs Immature Granulocytes: 0 10*3/uL (ref 0.00–0.07)
Basophils Absolute: 0 10*3/uL (ref 0.0–0.1)
Basophils Relative: 0 %
Eosinophils Absolute: 0.1 10*3/uL (ref 0.0–0.5)
Eosinophils Relative: 3 %
HCT: 17.9 % — ABNORMAL LOW (ref 36.0–46.0)
Hemoglobin: 5.6 g/dL — CL (ref 12.0–15.0)
Immature Granulocytes: 0 %
Lymphocytes Relative: 29 %
Lymphs Abs: 0.9 10*3/uL (ref 0.7–4.0)
MCH: 34.4 pg — ABNORMAL HIGH (ref 26.0–34.0)
MCHC: 31.3 g/dL (ref 30.0–36.0)
MCV: 109.8 fL — ABNORMAL HIGH (ref 80.0–100.0)
Monocytes Absolute: 0.2 10*3/uL (ref 0.1–1.0)
Monocytes Relative: 7 %
Neutro Abs: 1.9 10*3/uL (ref 1.7–7.7)
Neutrophils Relative %: 61 %
Platelets: 124 10*3/uL — ABNORMAL LOW (ref 150–400)
RBC: 1.63 MIL/uL — ABNORMAL LOW (ref 3.87–5.11)
RDW: 14.6 % (ref 11.5–15.5)
WBC: 3.2 10*3/uL — ABNORMAL LOW (ref 4.0–10.5)
nRBC: 0 % (ref 0.0–0.2)

## 2019-01-20 LAB — NO BLOOD PRODUCTS

## 2019-01-20 LAB — I-STAT CHEM 8, ED
BUN: 126 mg/dL — ABNORMAL HIGH (ref 6–20)
Calcium, Ion: 1.02 mmol/L — ABNORMAL LOW (ref 1.15–1.40)
Chloride: 96 mmol/L — ABNORMAL LOW (ref 98–111)
Creatinine, Ser: 9.3 mg/dL — ABNORMAL HIGH (ref 0.44–1.00)
Glucose, Bld: 168 mg/dL — ABNORMAL HIGH (ref 70–99)
HCT: 18 % — ABNORMAL LOW (ref 36.0–46.0)
Hemoglobin: 6.1 g/dL — CL (ref 12.0–15.0)
Potassium: 6.2 mmol/L — ABNORMAL HIGH (ref 3.5–5.1)
Sodium: 129 mmol/L — ABNORMAL LOW (ref 135–145)
TCO2: 25 mmol/L (ref 22–32)

## 2019-01-20 LAB — GLUCOSE, CAPILLARY
Glucose-Capillary: 88 mg/dL (ref 70–99)
Glucose-Capillary: 216 mg/dL — ABNORMAL HIGH (ref 70–99)

## 2019-01-20 LAB — POC OCCULT BLOOD, ED: Fecal Occult Bld: POSITIVE — AB

## 2019-01-20 LAB — MRSA PCR SCREENING: MRSA by PCR: POSITIVE — AB

## 2019-01-20 MED ORDER — DIPHENHYDRAMINE HCL 25 MG PO CAPS
25.0000 mg | ORAL_CAPSULE | Freq: Four times a day (QID) | ORAL | Status: DC | PRN
  Administered 2019-01-20: 25 mg via ORAL
  Filled 2019-01-20: qty 1

## 2019-01-20 MED ORDER — DULOXETINE HCL 30 MG PO CPEP: 30.0000 mg | ORAL_CAPSULE | Freq: Every day | ORAL | Status: DC

## 2019-01-20 MED ORDER — POLYETHYLENE GLYCOL 3350 17 G PO PACK
17.0000 g | PACK | Freq: Once | ORAL | Status: AC
  Administered 2019-01-21: 17 g via ORAL
  Filled 2019-01-20: qty 1

## 2019-01-20 MED ORDER — DULOXETINE HCL 30 MG PO CPEP
30.0000 mg | ORAL_CAPSULE | Freq: Every day | ORAL | Status: DC
  Administered 2019-01-20 – 2019-01-26 (×7): 30 mg via ORAL
  Filled 2019-01-20 (×8): qty 1

## 2019-01-20 MED ORDER — DULOXETINE HCL 60 MG PO CPEP: 60.0000 mg | ORAL_CAPSULE | Freq: Every day | ORAL | Status: DC

## 2019-01-20 MED ORDER — ALTEPLASE 2 MG IJ SOLR: 2.0000 mg | Freq: Once | INTRAMUSCULAR | Status: DC | PRN

## 2019-01-20 MED ORDER — DULOXETINE HCL 60 MG PO CPEP
60.0000 mg | ORAL_CAPSULE | Freq: Every day | ORAL | Status: DC
  Administered 2019-01-21 – 2019-01-26 (×6): 60 mg via ORAL
  Filled 2019-01-20 (×7): qty 1

## 2019-01-20 MED ORDER — OXYCODONE HCL 5 MG PO TABS
10.0000 mg | ORAL_TABLET | Freq: Four times a day (QID) | ORAL | Status: DC | PRN
  Administered 2019-01-20 – 2019-01-24 (×7): 10 mg via ORAL
  Filled 2019-01-20 (×6): qty 2

## 2019-01-20 MED ORDER — SODIUM CHLORIDE 0.9 % IV SOLN: 100.0000 mL | INTRAVENOUS | Status: DC | PRN

## 2019-01-20 MED ORDER — LIDOCAINE-PRILOCAINE 2.5-2.5 % EX CREA: 1.0000 "application " | TOPICAL_CREAM | CUTANEOUS | Status: DC | PRN

## 2019-01-20 MED ORDER — DIPHENHYDRAMINE-ZINC ACETATE 2-0.1 % EX CREA
TOPICAL_CREAM | Freq: Three times a day (TID) | CUTANEOUS | Status: DC | PRN
  Administered 2019-01-20 – 2019-01-21 (×2): via TOPICAL
  Filled 2019-01-20: qty 28

## 2019-01-20 MED ORDER — LABETALOL HCL 200 MG PO TABS
200.0000 mg | ORAL_TABLET | Freq: Two times a day (BID) | ORAL | Status: DC
  Administered 2019-01-20 – 2019-01-24 (×5): 200 mg via ORAL
  Filled 2019-01-20 (×12): qty 1

## 2019-01-20 MED ORDER — EPOETIN ALFA 20000 UNIT/ML IJ SOLN: 20000.0000 [IU] | INTRAMUSCULAR | Status: DC

## 2019-01-20 MED ORDER — SODIUM ZIRCONIUM CYCLOSILICATE 10 G PO PACK
10.0000 g | PACK | Freq: Once | ORAL | Status: AC
  Administered 2019-01-20: 10 g via ORAL
  Filled 2019-01-20: qty 1

## 2019-01-20 MED ORDER — HEPARIN SODIUM (PORCINE) 1000 UNIT/ML DIALYSIS: 1000.0000 [IU] | INTRAMUSCULAR | Status: DC | PRN

## 2019-01-20 MED ORDER — SODIUM BICARBONATE 8.4 % IV SOLN
50.0000 meq | Freq: Once | INTRAVENOUS | Status: AC
  Administered 2019-01-20: 50 meq via INTRAVENOUS
  Filled 2019-01-20: qty 50

## 2019-01-20 MED ORDER — ACETAMINOPHEN 325 MG PO TABS: 650.0000 mg | ORAL_TABLET | Freq: Four times a day (QID) | ORAL | Status: DC | PRN

## 2019-01-20 MED ORDER — DARBEPOETIN ALFA 200 MCG/0.4ML IJ SOSY
PREFILLED_SYRINGE | INTRAMUSCULAR | Status: AC
  Filled 2019-01-20: qty 0.4

## 2019-01-20 MED ORDER — ALPRAZOLAM 0.5 MG PO TABS: 0.5000 mg | ORAL_TABLET | Freq: Three times a day (TID) | ORAL | Status: DC | PRN

## 2019-01-20 MED ORDER — ALLOPURINOL 100 MG PO TABS
200.0000 mg | ORAL_TABLET | Freq: Every day | ORAL | Status: DC
  Administered 2019-01-20 – 2019-01-26 (×7): 200 mg via ORAL
  Filled 2019-01-20 (×8): qty 2

## 2019-01-20 MED ORDER — SENNA 8.6 MG PO TABS
2.0000 | ORAL_TABLET | Freq: Every day | ORAL | Status: DC
  Administered 2019-01-22 – 2019-01-26 (×4): 17.2 mg via ORAL
  Filled 2019-01-20 (×7): qty 2

## 2019-01-20 MED ORDER — LIDOCAINE HCL (PF) 1 % IJ SOLN: 5.0000 mL | INTRAMUSCULAR | Status: DC | PRN

## 2019-01-20 MED ORDER — CALCIUM ACETATE (PHOS BINDER) 667 MG PO CAPS: 1334.0000 mg | ORAL_CAPSULE | Freq: Three times a day (TID) | ORAL | Status: DC

## 2019-01-20 MED ORDER — DARBEPOETIN ALFA 200 MCG/0.4ML IJ SOSY
200.0000 ug | PREFILLED_SYRINGE | INTRAMUSCULAR | Status: DC
  Administered 2019-01-20: 200 ug via INTRAVENOUS

## 2019-01-20 MED ORDER — ACETAMINOPHEN 650 MG RE SUPP: 650.0000 mg | Freq: Four times a day (QID) | RECTAL | Status: DC | PRN

## 2019-01-20 MED ORDER — RENA-VITE PO TABS
1.0000 | ORAL_TABLET | Freq: Every day | ORAL | Status: DC
  Administered 2019-01-20 – 2019-01-26 (×6): 1 via ORAL
  Filled 2019-01-20 (×7): qty 1

## 2019-01-20 MED ORDER — FERRIC CITRATE 1 GM 210 MG(FE) PO TABS
420.0000 mg | ORAL_TABLET | Freq: Three times a day (TID) | ORAL | Status: DC
  Administered 2019-01-20 – 2019-01-21 (×2): 420 mg via ORAL
  Administered 2019-01-21: 210 mg via ORAL
  Administered 2019-01-22 – 2019-01-26 (×12): 420 mg via ORAL
  Filled 2019-01-20 (×15): qty 2

## 2019-01-20 MED ORDER — INSULIN GLARGINE 100 UNIT/ML ~~LOC~~ SOLN
20.0000 [IU] | Freq: Every day | SUBCUTANEOUS | Status: DC
  Administered 2019-01-20 – 2019-01-24 (×2): 20 [IU] via SUBCUTANEOUS
  Filled 2019-01-20 (×8): qty 0.2

## 2019-01-20 MED ORDER — FENTANYL CITRATE (PF) 100 MCG/2ML IJ SOLN
50.0000 ug | Freq: Once | INTRAMUSCULAR | Status: AC
  Administered 2019-01-20: 50 ug via INTRAVENOUS
  Filled 2019-01-20: qty 2

## 2019-01-20 MED ORDER — POLYETHYLENE GLYCOL 3350 17 G PO PACK
17.0000 g | PACK | Freq: Once | ORAL | Status: AC
  Administered 2019-01-20: 17 g via ORAL
  Filled 2019-01-20: qty 1

## 2019-01-20 MED ORDER — FOLIC ACID 1 MG PO TABS
1.0000 mg | ORAL_TABLET | Freq: Every day | ORAL | Status: DC
  Administered 2019-01-21 – 2019-01-26 (×5): 1 mg via ORAL
  Filled 2019-01-20 (×6): qty 1

## 2019-01-20 MED ORDER — OXYCODONE HCL 5 MG PO TABS
ORAL_TABLET | ORAL | Status: AC
  Filled 2019-01-20: qty 2

## 2019-01-20 MED ORDER — INSULIN ASPART 100 UNIT/ML ~~LOC~~ SOLN: 0.0000 [IU] | Freq: Three times a day (TID) | SUBCUTANEOUS | Status: DC

## 2019-01-20 MED ORDER — ATORVASTATIN CALCIUM 40 MG PO TABS
40.0000 mg | ORAL_TABLET | Freq: Every day | ORAL | Status: DC
  Administered 2019-01-20 – 2019-01-25 (×6): 40 mg via ORAL
  Filled 2019-01-20 (×6): qty 1

## 2019-01-20 MED ORDER — SODIUM CHLORIDE 0.9% FLUSH
3.0000 mL | Freq: Two times a day (BID) | INTRAVENOUS | Status: DC
  Administered 2019-01-20 – 2019-01-26 (×4): 3 mL via INTRAVENOUS

## 2019-01-20 MED ORDER — PANTOPRAZOLE SODIUM 40 MG PO TBEC
40.0000 mg | DELAYED_RELEASE_TABLET | Freq: Every day | ORAL | Status: DC
  Filled 2019-01-20: qty 1

## 2019-01-20 MED ORDER — LIDOCAINE-PRILOCAINE 2.5-2.5 % EX CREA: 1.0000 "application " | TOPICAL_CREAM | CUTANEOUS | Status: DC

## 2019-01-20 MED ORDER — PENTAFLUOROPROP-TETRAFLUOROETH EX AERO: 1.0000 "application " | INHALATION_SPRAY | CUTANEOUS | Status: DC | PRN

## 2019-01-20 MED ORDER — MUPIROCIN 2 % EX OINT: 1.0000 "application " | TOPICAL_OINTMENT | CUTANEOUS | Status: DC | PRN

## 2019-01-20 MED ORDER — SACCHAROMYCES BOULARDII 250 MG PO CAPS
250.0000 mg | ORAL_CAPSULE | Freq: Every day | ORAL | Status: DC
  Administered 2019-01-20 – 2019-01-26 (×7): 250 mg via ORAL
  Filled 2019-01-20 (×10): qty 1

## 2019-01-20 MED ORDER — ALBUTEROL SULFATE HFA 108 (90 BASE) MCG/ACT IN AERS
2.0000 | INHALATION_SPRAY | Freq: Four times a day (QID) | RESPIRATORY_TRACT | Status: DC | PRN
  Filled 2019-01-20: qty 6.7

## 2019-01-20 MED ORDER — CHLORHEXIDINE GLUCONATE CLOTH 2 % EX PADS
6.0000 | MEDICATED_PAD | Freq: Every day | CUTANEOUS | Status: DC
  Administered 2019-01-21 – 2019-01-24 (×4): 6 via TOPICAL

## 2019-01-20 MED ORDER — CALCITRIOL 0.25 MCG PO CAPS
1.0000 ug | ORAL_CAPSULE | ORAL | Status: DC
  Administered 2019-01-21: 1 ug via ORAL
  Filled 2019-01-20: qty 4

## 2019-01-20 NOTE — Progress Notes (Signed)
NEW ADMISSION NOTE New Admission Note:   Arrival Method: stretcher from ED Mental Orientation: alert and oriented x4  Telemetry: box: 33: NSR Assessment: Completed Skin: dried scabs bilateral legs and upper back  IV: R hand  Pain: 0 Safety Measures: Safety Fall Prevention Plan has been given, discussed and signed Admission: Completed [facility] Orientation: Patient has been orientated to the room, unit and staff.  Family: NA  Orders have been reviewed and implemented. Will continue to monitor the patient. Call light has been placed within reach and bed alarm has been activated.   Baldo Ash, RN

## 2019-01-20 NOTE — Consult Note (Signed)
[facility] Renal Consultation Note    Indication for Consultation:  Management of ESRD/hemodialysis; anemia, hypertension/volume and secondary hyperparathyroidism  HPI: [name] is a 53 y.o. female with ESRD on HD, HTN, T2DM, HFrEF, Hep C, Bipolar, and Hx recurrent GI bleeds (last adm 12/29/18). W/u so far unrevealing no source of bleeding found. Most recently negative enteroscopy in May.   Admitted with GI bleeding and hyperkalemia. Labs significant for K 6.2, Na 129, BUN 126, Hgb 6.1  Reported bloody BMs starting 2 days prior. She is a Sales promotion account executive Witness and does not want to receive blood products today though she has accepted in the past. GI has seen with plans for capsule endoscopy.   Dialyzed TTS at [facility]. Missed HD yesterday. Asked to see for urgent HD today.   Seen and examined in HD unit. Hypertensive at start of treatment. SBPs 170s now coming down to 100s and patient starting to feel dizzy. Also endorses lower abdominal pain.   Past Medical History:  Diagnosis Date  . Anemia   . Anxiety   . Bipolar disorder, unspecified ([place]) 02/06/2014  . Chronic combined systolic and diastolic CHF (congestive heart failure) (HCC)    EF 40% by echo and 48% by stress test  . Chronic pain syndrome   . Depression   . ESRD on hemodialysis (HCC)    TTS  . GERD (gastroesophageal reflux disease)   . Glaucoma   . Headache    sinus headaches  . Hepatitis C   . High cholesterol   . History of hiatal hernia   . History of vitamin D deficiency 06/20/2015  . Hypertension   . MRSA infection ?2014   "on the back of my head; spread throughout my bloodstream"  . Neuropathy   . Refusal of blood transfusions as patient is Jehovah's Witness   . Schizoaffective disorder, unspecified condition 02/08/2014  . Seizure [facility]) dx'd 2014   "don't know what kind; last one was ~ 04/2014"  . TIA (transient ischemic attack) 2010  . Type II diabetes mellitus ([place])    INSULIN DEPENDENT    Past Surgical History:  Procedure Laterality Date  . ABDOMINAL HYSTERECTOMY  2014  . ABDOMINAL HYSTERECTOMY  2010  . AMPUTATION Left 05/21/2013   Procedure: Left Midfoot AMPUTATION;  Surgeon: Newt Minion, MD;  Location: [place];  Service: Orthopedics;  Laterality: Left;  Left Midfoot Amputation  . AV FISTULA PLACEMENT Left 05/04/2015   Procedure: ARTERIOVENOUS (AV) GRAFT INSERTION;  Surgeon: Angelia Mould, MD;  Location: [place];  Service: Vascular;  Laterality: Left;  . BIOPSY  11/18/2018   Procedure: BIOPSY;  Surgeon: Jerene Bears, MD;  Location: [facility] ENDOSCOPY;  Service: Gastroenterology;;  . COLONOSCOPY WITH PROPOFOL N/A 06/22/2013   Procedure: COLONOSCOPY WITH PROPOFOL;  Surgeon: Jeryl Columbia, MD;  Location: [facility] ENDOSCOPY;  Service: Endoscopy;  Laterality: N/A;  . COLONOSCOPY WITH PROPOFOL N/A 11/18/2018   Procedure: COLONOSCOPY WITH PROPOFOL;  Surgeon: Jerene Bears, MD;  Location: [place];  Service: Gastroenterology;  Laterality: N/A;  . ENTEROSCOPY N/A 12/31/2018   Procedure: ENTEROSCOPY;  Surgeon: Irene Shipper, MD;  Location: [facility] ENDOSCOPY;  Service: Endoscopy;  Laterality: N/A;  Push enteroscopy at 8am (spoke with Endo)  . ESOPHAGOGASTRODUODENOSCOPY N/A 05/11/2015   Procedure: ESOPHAGOGASTRODUODENOSCOPY (EGD);  Surgeon: Laurence Spates, MD;  Location: [facility] ENDOSCOPY;  Service: Endoscopy;  Laterality: N/A;  . ESOPHAGOGASTRODUODENOSCOPY (EGD) WITH PROPOFOL N/A 11/18/2018   Procedure: ESOPHAGOGASTRODUODENOSCOPY (EGD) WITH PROPOFOL;  Surgeon: Zenovia Jarred  M, MD;  Location: [place];  Service: Gastroenterology;  Laterality: N/A;  . EYE SURGERY Bilateral 2010   Lasik  . FLEXIBLE SIGMOIDOSCOPY N/A 01/17/2017   Procedure: FLEXIBLE SIGMOIDOSCOPY w/ hemorrhoid banding possible;  Surgeon: Gatha Mayer, MD;  Location: [facility] ENDOSCOPY;  Service: Endoscopy;  Laterality: N/A;  . FOOT AMPUTATION Left    due diabetic neuropathy, could not feel ulcer on bottom of foot  . POLYPECTOMY  11/18/2018    Procedure: POLYPECTOMY;  Surgeon: Jerene Bears, MD;  Location: [facility] ENDOSCOPY;  Service: Gastroenterology;;  . REFRACTIVE SURGERY Bilateral 2013  . TONSILLECTOMY  1970's  . TUBAL LIGATION  2000   Family History  Problem Relation Age of Onset  . Hypertension Mother   . Diabetes Mother   . Hypertension Father   . Diabetes Father    Social History:  reports that she has been smoking cigarettes. She has a 36.00 pack-year smoking history. She has never used smokeless tobacco. She reports current alcohol use of about 3.0 standard drinks of alcohol per week. She reports previous drug use. Drugs: "Crack" cocaine and Cocaine. Allergies  Allergen Reactions  . Penicillins Hives and Other (See Comments)    Has patient had a PCN reaction causing immediate rash, facial/tongue/throat swelling, SOB or lightheadedness with hypotension: Yes Has patient had a PCN reaction causing severe rash involving mucus membranes or skin necrosis: No Has patient had a PCN reaction that required hospitalization No Has patient had a PCN reaction occurring within the last 10 years: No If all of the above answers are "NO", then may proceed with Cephalosporin use. Pt tolerates cefepime and ceftriaxone  . Gabapentin Itching and Swelling  . Lyrica [Pregabalin] Swelling and Other (See Comments)    Facial and leg swelling  . Saphris [Asenapine] Swelling and Other (See Comments)    Facial swelling  . Tramadol Swelling and Other (See Comments)    Leg swelling  . Adhesive [Tape] Itching    SEVERE ITCHING  . Latex Itching    SEVERE ITCHING  . Other Other (See Comments)    NO BLOOD PRODUCTS; PATIENT IS A JEHOVAH'S WITNESS   Prior to Admission medications   Medication Sig Start Date End Date Taking? Authorizing Provider  albuterol (PROVENTIL HFA;VENTOLIN HFA) 108 (90 Base) MCG/ACT inhaler Inhale 2 puffs into the lungs every 4 (four) hours as needed for wheezing or shortness of breath. 10/15/16  Yes Horton, Barbette Hair, MD   allopurinol (ZYLOPRIM) 100 MG tablet Take 1 tablet (100 mg total) by mouth every Tuesday, Thursday, and Saturday at 6 PM. Patient taking differently: Take 200 mg by mouth daily.  01/25/17  Yes Nita Sells, MD  ALPRAZolam Duanne Moron) 0.5 MG tablet Take 1 tablet (0.5 mg total) by mouth every 8 (eight) hours as needed for anxiety. 11/20/18  Yes Fuller Plan A, MD  atorvastatin (LIPITOR) 40 MG tablet Take 1 tablet (40 mg total) by mouth at bedtime. 09/02/16  Yes Hildred Priest, MD  calcitRIOL (ROCALTROL) 0.25 MCG capsule Take 7 capsules (1.75 mcg total) by mouth every dialysis (3 times per week on TTS). Patient taking differently: Take 1 mcg by mouth every dialysis (3 times per week on TTS). Taking 4 capsules (0.62mcg) = 43mcg 10/10/18  Yes Ghimire, Henreitta Leber, MD  diphenhydrAMINE (BENADRYL) 25 mg capsule Take 25 mg by mouth every 6 (six) hours as needed for itching.    Yes [provider]  docusate sodium (COLACE) 100 MG capsule Take 100 mg by mouth 2 (two) times daily.  Yes [provider]  DULoxetine (CYMBALTA) 30 MG capsule Take 30 mg by mouth daily. Take with 60 for a total of 90   Yes [provider]  DULoxetine (CYMBALTA) 60 MG capsule Take 60 mg by mouth daily. Take one capsule with 30mg  to equal 90mg .   Yes [provider]  epoetin alfa (EPOGEN,PROCRIT) 77824 UNIT/ML injection Inject 1 mL (20,000 Units total) into the vein Every Tuesday,Thursday,and Saturday with dialysis. 10/10/18  Yes Ghimire, Henreitta Leber, MD  ferric citrate (AURYXIA) 1 GM 210 MG(Fe) tablet Take 420 mg by mouth 3 (three) times daily with meals.   Yes [provider]  Ferrous Sulfate (SLOW FE) 142 (45 Fe) MG TBCR Take 1 tablet by mouth daily. 07/19/17  Yes Mariel Aloe, MD  folic acid (FOLVITE) 1 MG tablet Take 1 tablet (1 mg total) by mouth daily. 10/10/18  Yes Ghimire, Henreitta Leber, MD  insulin aspart (NOVOLOG) 100 UNIT/ML injection 0-9 Units, Subcutaneous, 3 times daily  with meals CBG < 70: implement hypoglycemia protocol CBG 70 - 120: 0 units CBG 121 - 150: 1 unit CBG 151 - 200: 2 units CBG 201 - 250: 3 units CBG 251 - 300: 5 units CBG 301 - 350: 7 units CBG 351 - 400: 9 units CBG > 400: call MD 10/10/18  Yes Ghimire, Henreitta Leber, MD  insulin glargine (LANTUS) 100 UNIT/ML injection Inject 20 Units into the skin at bedtime.   Yes [provider]  labetalol (NORMODYNE) 200 MG tablet Take 1 tablet (200 mg total) by mouth 2 (two) times daily. 11/20/18  Yes Fuller Plan A, MD  lidocaine-prilocaine (EMLA) cream Apply 1 application topically See admin instructions. On dialysis days 12/23/18  Yes [provider]  methocarbamol (ROBAXIN) 500 MG tablet Take 500 mg by mouth 3 (three) times daily as needed for muscle spasms.   Yes [provider]  multivitamin (RENA-VIT) TABS tablet Take 1 tablet by mouth at bedtime. 09/02/16  Yes Hildred Priest, MD  mupirocin ointment (BACTROBAN) 2 % Apply 1 application topically as needed (on arm).  12/01/18  Yes [provider]  ondansetron (ZOFRAN) 4 MG tablet Take 4 mg by mouth every 8 (eight) hours as needed for nausea or vomiting.   Yes [provider]  Oxycodone HCl 10 MG TABS Take 1 tablet (10 mg total) by mouth every 6 (six) hours as needed (for pain). 11/20/18  Yes Fuller Plan A, MD  pantoprazole (PROTONIX) 40 MG tablet Take 1 tablet (40 mg total) by mouth daily at 6 (six) AM. 07/20/17  Yes Mariel Aloe, MD  saccharomyces boulardii (FLORASTOR) 250 MG capsule Take 250 mg by mouth daily.   Yes [provider]  senna (SENOKOT) 8.6 MG tablet Take 2 tablets by mouth daily.   Yes [provider]  traZODone (DESYREL) 50 MG tablet Take 0.5 tablets (25 mg total) by mouth at bedtime. Patient taking differently: Take 50 mg by mouth at bedtime.  07/19/17  Yes Mariel Aloe, MD  amLODipine (NORVASC) 10 MG tablet Take 1 tablet (10 mg total) by mouth at  bedtime. Patient not taking: Reported on 12/29/2018 09/02/16   Hildred Priest, MD  hydrocortisone (ANUSOL-HC) 2.5 % rectal cream Place rectally 2 (two) times daily. Patient not taking: Reported on 12/29/2018 01/24/17   Nita Sells, MD  Insulin Detemir (LEVEMIR FLEXTOUCH) 100 UNIT/ML Pen Inject 12 Units into the skin at bedtime. Patient not taking: Reported on 01/20/2019 10/10/18   Jonetta Osgood, MD  Current Facility-Administered Medications  Medication Dose Route Frequency Provider Last Rate Last Dose  . 0.9 %  sodium chloride infusion  100 mL Intravenous PRN Helaina Stefano, Thomos Lemons, PA-C      . 0.9 %  sodium chloride infusion  100 mL Intravenous PRN Yonael Tulloch, Thomos Lemons, PA-C      . acetaminophen (TYLENOL) tablet 650 mg  650 mg Oral Q6H PRN Norval Morton, MD       Or  . acetaminophen (TYLENOL) suppository 650 mg  650 mg Rectal Q6H PRN Smith, Rondell A, MD      . albuterol (VENTOLIN HFA) 108 (90 Base) MCG/ACT inhaler 2 puff  2 puff Inhalation Q6H PRN [name], Rondell A, MD      . allopurinol (ZYLOPRIM) tablet 200 mg  200 mg Oral Daily Smith, Rondell A, MD      . ALPRAZolam Duanne Moron) tablet 0.5 mg  0.5 mg Oral Q8H PRN Smith, Rondell A, MD      . alteplase (CATHFLO ACTIVASE) injection 2 mg  2 mg Intracatheter Once PRN Lynnda Child, PA-C      . atorvastatin (LIPITOR) tablet 40 mg  40 mg Oral QHS Smith, Rondell A, MD      . Derrill Memo ON 01/21/2019] calcitRIOL (ROCALTROL) capsule 1 mcg  1 mcg Oral Q T,Th,Sat-1800 Smith, Rondell A, MD      . Chlorhexidine Gluconate Cloth 2 % PADS 6 each  6 each Topical Q0600 Lynnda Child, PA-C      . diphenhydrAMINE (BENADRYL) capsule 25 mg  25 mg Oral Q6H PRN Smith, Rondell A, MD      . DULoxetine (CYMBALTA) DR capsule 60 mg  60 mg Oral Daily Norval Morton, MD       And  . DULoxetine (CYMBALTA) DR capsule 30 mg  30 mg Oral Daily Fuller Plan A, MD      . Derrill Memo ON 01/21/2019] epoetin alfa (EPOGEN) injection 20,000 Units  20,000  Units Intravenous Q T,Th,Sa-HD Smith, Rondell A, MD      . ferric citrate (AURYXIA) tablet 420 mg  420 mg Oral TID WC Smith, Rondell A, MD      . folic acid (FOLVITE) tablet 1 mg  1 mg Oral Daily Smith, Rondell A, MD      . heparin injection 1,000 Units  1,000 Units Dialysis PRN Charrise Lardner Grace, PA-C      . insulin aspart (novoLOG) injection 0-9 Units  0-9 Units Subcutaneous TID WC Smith, Rondell A, MD      . insulin glargine (LANTUS) injection 20 Units  20 Units Subcutaneous QHS Smith, Rondell A, MD      . lidocaine (PF) (XYLOCAINE) 1 % injection 5 mL  5 mL Intradermal PRN Brendt Dible, Thomos Lemons, PA-C      . lidocaine-prilocaine (EMLA) cream 1 application  1 application Topical PRN Duke Weisensel, Thomos Lemons, PA-C      . multivitamin (RENA-VIT) tablet 1 tablet  1 tablet Oral QHS Smith, Rondell A, MD      . mupirocin ointment (BACTROBAN) 2 % 1 application  1 application Topical PRN Smith, Rondell A, MD      . oxyCODONE (Oxy IR/ROXICODONE) immediate release tablet 10 mg  10 mg Oral Q6H PRN Smith, Rondell A, MD      . pantoprazole (PROTONIX) EC tablet 40 mg  40 mg Oral Q0600 Smith, Rondell A, MD      . pentafluoroprop-tetrafluoroeth (GEBAUERS) aerosol 1 application  1 application Topical PRN Lynnda Child, PA-C      .  polyethylene glycol (MIRALAX / GLYCOLAX) packet 17 g  17 g Oral Once Buccini, Robert, MD      . polyethylene glycol (MIRALAX / GLYCOLAX) packet 17 g  17 g Oral Once Buccini, Herbie Baltimore, MD      . saccharomyces boulardii (FLORASTOR) capsule 250 mg  250 mg Oral Daily Smith, Rondell A, MD      . senna (SENOKOT) tablet 17.2 mg  2 tablet Oral Daily Smith, Rondell A, MD      . sodium chloride flush (NS) 0.9 % injection 3 mL  3 mL Intravenous Q12H Smith, Rondell A, MD         ROS: As per HPI otherwise negative.  Physical Exam: Vitals:   01/20/19 1158 01/20/19 1230 01/20/19 1300 01/20/19 1330  BP: (!) 166/68 121/61 (!) 119/58 (!) 101/53  Pulse: 79 84 88 82  Resp:      Temp:       TempSrc:      SpO2:      Weight:      Height:         General: WDWN female NAD  Head: NCAT sclera not icteric MMM Neck: Supple. No JVD No masses Lungs: CTA bilaterally without wheezes, rales, or rhonchi. Breathing is unlabored. Heart: RRR with S1 S2 Abdomen: soft NT + BS Lower extremities:without edema or ischemic changes, no open wounds  Neuro: A & O  X 3. Moves all extremities spontaneously. Psych:  Responds to questions appropriately with a normal affect. Dialysis Access: L AVG in use on HD   Labs: Basic Metabolic Panel: Recent Labs  Lab 01/20/19 0741 01/20/19 0748  NA 134* 129*  K 6.4* 6.2*  CL 92* 96*  CO2 24  --   GLUCOSE 177* 168*  BUN 116* 126*  CREATININE 8.54* 9.30*  CALCIUM 9.3  --    Liver Function Tests: Recent Labs  Lab 01/20/19 0741  AST 47*  ALT 45*  ALKPHOS 133*  BILITOT 0.8  PROT 6.9  ALBUMIN 3.3*   No results for input(s): LIPASE, AMYLASE in the last 168 hours. No results for input(s): AMMONIA in the last 168 hours. CBC: Recent Labs  Lab 01/20/19 0741 01/20/19 0748  WBC 3.2*  --   NEUTROABS 1.9  --   HGB 5.6* 6.1*  HCT 17.9* 18.0*  MCV 109.8*  --   PLT 124*  --    Cardiac Enzymes: No results for input(s): CKTOTAL, CKMB, CKMBINDEX, TROPONINI in the last 168 hours. CBG: No results for input(s): GLUCAP in the last 168 hours. Iron Studies: No results for input(s): IRON, TIBC, TRANSFERRIN, FERRITIN in the last 72 hours. Studies/Results: No results found.  Dialysis Orders:  GOC TTS 3.5h 400/A 2.0 EDW 72.5kg 2K/2Ca Profile 4 L AVG  No heparin  --Calcitriol 2.0 PO TIW  -- Venofer 50 mg q week  -- Mircera 225 mcg IV q 2 weeks (last dosed 12/03/18) Recent OP Labs: 5/21: Hgb 10.1 Tsat 46% Ferritin 1067 Ca 10.2 Alb 4.0   iPTH  294  Assessment/Plan: 1. Recurrent GI bleed/symptomatic anemia --Hgb 6.1. Patient is Jehovah's Witness and does not want to receive blood products today. GI following with plans for  Sigmoidoscopy/capsule  endoscopy  2. Hyperkalemia -  2/2 Missed HD. Will correct with HD today.  3.  ESRD -  TTS. Off schedule today d/t missed HD. Back on schedule tomorrow.  4.  Hypertension/volume  - Hypertensive on admission. Coming down on HD. Lower goal with soft BPs/GIB. 5.  Anemia  -  As above. Due for ESA redose. Will give Aranesp 200 with HD today.  6.  Metabolic bone disease -  Continue VDRA/binders as able  7.  Nutrition - Renal diet/vitamins as able  8. DM - per primary   Lynnda Child PA-C [place] Pager [phone] 01/20/2019, 2:03 PM

## 2019-01-20 NOTE — ED Notes (Signed)
Pt signed refusal of blood products and at bedside ; copy send to blood bank

## 2019-01-20 NOTE — Procedures (Signed)
   Patient seen and examined on Hemodialysis. BP 127/62 (BP Location: Right Arm)   Pulse 83   Temp 97.8 F (36.6 C) (Oral)   Resp 18   Ht 5\' 4"  (1.626 m)   Wt 73.1 kg   LMP  (LMP Unknown)   SpO2 93%   BMI 27.66 kg/m   QB 400 mL/ min via AVG UF goal 3.8L.  GI bleed, has refused blood products.  Tolerating treatment without complaints at this time.  Madelon Lips MD [facility] pgr [phone]

## 2019-01-20 NOTE — ED Triage Notes (Signed)
Pt arrives via EMS, reporting she has had a GI bleed for about 1 year. She reports she filled half of a small bathroom trash can with bleeding/clots tonight. She is having lower abdominal pain. Headache 10/10.  Also concerned about sores on her arms and face. Dialysis pt (T/Th/Sat), missed Tuesday dialysis. Last was on Saturday. 134/72, hr 90, resp 16, O2 100% cbg 240, temp 98.1oral.

## 2019-01-20 NOTE — ED Notes (Signed)
Dr. Darl Householder aware of 5.6 hgb, no further orders

## 2019-01-20 NOTE — H&P (Signed)
History and Physical    [name] XNA:[id number] DOB: 1965/10/19 DOA: 01/20/2019  Referring MD/NP/PA: Shirlyn Goltz, MD PCP: Patient, [name]  Patient coming from: Home via EMS  Chief Complaint: Rectal bleeding  I have personally briefly reviewed patient's old medical records in [place]   HPI: [name] is a 53 y.o. female with medical history significant of ESRD on HD, Jehovah's Witness, combined CHF, DM type II,  anxiety, depression, hepatitis C, bipolar disorder, PVD s/p left TMA, anxiety, anemia, and GI bleed; who presents after having 2 bowel movements with red blood and dark clots prior to arrival.  Patient expresses frustration with having several admissions in the past for the same with them not finding a clear cause of her symptoms.  She reports feeling bad receiving blood products previously due to her religion as a Restaurant manager, fast food.  She would not like to receive blood products at this time and wants [place] to do other methods such as Epogen shots and IV fluids.  She complains of having chronic pain and has been out of all of her medications as she no longer has a primary care provider.  Complains of having a hernia on the left lower abdominal side that nobody has addressed or told her about.  Also is concerned about multiple skin lesions that are itchy that she feels is related to MRSA.  Patient feels that these have also not been addressed appropriately.  Other symptoms include shortness of breath with exertion.  ED Course: Upon admission into the emergency department patient was seen to be afebrile, blood pressure 119/57-150 7/75, and all other vital signs maintained.  Labs revealed WBC 3.2, hemoglobin 5.6, potassium 6.4, BUN 116, creatinine 8.57, and glucose 177.  Patient never followed up with [facility] after last hospitalization.  [facility] consulted.  Nephrology also consulted for need of hemodialysis.  COVID-19 screen negative.  Patient currently declining blood  products.  She was given 10 mg of lokelma, 50 mEq of sodium bicarb, and fentanyl 50 mcg IV.  Patient had a bloody bowel movement while in the ED as well with blood clots.  TRH called to admit.  Patient again states that she would not like any blood products, but would like to remain a full code.  Review of Systems  Constitutional: Positive for malaise/fatigue. Negative for fever.  HENT: Negative for sore throat.   Eyes: Negative for photophobia and pain.  Respiratory: Positive for shortness of breath.   Cardiovascular: Negative for chest pain and leg swelling.  Gastrointestinal: Positive for abdominal pain and blood in stool.  Genitourinary: Negative for dysuria and hematuria.  Musculoskeletal: Positive for myalgias. Negative for falls.  Skin: Positive for itching and rash.  Neurological: Positive for weakness. Negative for loss of consciousness.  Endo/Heme/Allergies: Polydipsia:   Bruises/bleeds easily.  Psychiatric/Behavioral: Negative for memory loss.    Past Medical History:  Diagnosis Date  . Anemia   . Anxiety   . Bipolar disorder, unspecified ([place]) 02/06/2014  . Chronic combined systolic and diastolic CHF (congestive heart failure) (HCC)    EF 40% by echo and 48% by stress test  . Chronic pain syndrome   . Depression   . ESRD on hemodialysis (HCC)    TTS  . GERD (gastroesophageal reflux disease)   . Glaucoma   . Headache    sinus headaches  . Hepatitis C   . High cholesterol   . History of hiatal hernia   . History of vitamin  D deficiency 06/20/2015  . Hypertension   . MRSA infection ?2014   "on the back of my head; spread throughout my bloodstream"  . Neuropathy   . Refusal of blood transfusions as patient is Jehovah's Witness   . Schizoaffective disorder, unspecified condition 02/08/2014  . Seizure [facility]) dx'd 2014   "don't know what kind; last one was ~ 04/2014"  . TIA (transient ischemic attack) 2010  . Type II diabetes mellitus ([place])    INSULIN DEPENDENT     Past Surgical History:  Procedure Laterality Date  . ABDOMINAL HYSTERECTOMY  2014  . ABDOMINAL HYSTERECTOMY  2010  . AMPUTATION Left 05/21/2013   Procedure: Left Midfoot AMPUTATION;  Surgeon: Newt Minion, MD;  Location: [facility];  Service: Orthopedics;  Laterality: Left;  Left Midfoot Amputation  . AV FISTULA PLACEMENT Left 05/04/2015   Procedure: ARTERIOVENOUS (AV) GRAFT INSERTION;  Surgeon: Angelia Mould, MD;  Location: [facility];  Service: Vascular;  Laterality: Left;  . BIOPSY  11/18/2018   Procedure: BIOPSY;  Surgeon: Jerene Bears, MD;  Location: [facility] ENDOSCOPY;  Service: Gastroenterology;;  . COLONOSCOPY WITH PROPOFOL N/A 06/22/2013   Procedure: COLONOSCOPY WITH PROPOFOL;  Surgeon: Jeryl Columbia, MD;  Location: [facility] ENDOSCOPY;  Service: Endoscopy;  Laterality: N/A;  . COLONOSCOPY WITH PROPOFOL N/A 11/18/2018   Procedure: COLONOSCOPY WITH PROPOFOL;  Surgeon: Jerene Bears, MD;  Location: [facility];  Service: Gastroenterology;  Laterality: N/A;  . ENTEROSCOPY N/A 12/31/2018   Procedure: ENTEROSCOPY;  Surgeon: Irene Shipper, MD;  Location: [facility] ENDOSCOPY;  Service: Endoscopy;  Laterality: N/A;  Push enteroscopy at 8am (spoke with Endo)  . ESOPHAGOGASTRODUODENOSCOPY N/A 05/11/2015   Procedure: ESOPHAGOGASTRODUODENOSCOPY (EGD);  Surgeon: Laurence Spates, MD;  Location: [facility] ENDOSCOPY;  Service: Endoscopy;  Laterality: N/A;  . ESOPHAGOGASTRODUODENOSCOPY (EGD) WITH PROPOFOL N/A 11/18/2018   Procedure: ESOPHAGOGASTRODUODENOSCOPY (EGD) WITH PROPOFOL;  Surgeon: Jerene Bears, MD;  Location: [facility] ENDOSCOPY;  Service: Gastroenterology;  Laterality: N/A;  . EYE SURGERY Bilateral 2010   Lasik  . FLEXIBLE SIGMOIDOSCOPY N/A 01/17/2017   Procedure: FLEXIBLE SIGMOIDOSCOPY w/ hemorrhoid banding possible;  Surgeon: Gatha Mayer, MD;  Location: [facility] ENDOSCOPY;  Service: Endoscopy;  Laterality: N/A;  . FOOT AMPUTATION Left    due diabetic neuropathy, could not feel ulcer on bottom of foot  . POLYPECTOMY  11/18/2018    Procedure: POLYPECTOMY;  Surgeon: Jerene Bears, MD;  Location: [facility] ENDOSCOPY;  Service: Gastroenterology;;  . REFRACTIVE SURGERY Bilateral 2013  . TONSILLECTOMY  1970's  . TUBAL LIGATION  2000     reports that she has been smoking cigarettes. She has a 36.00 pack-year smoking history. She has never used smokeless tobacco. She reports current alcohol use of about 3.0 standard drinks of alcohol per week. She reports previous drug use. Drugs: "Crack" cocaine and Cocaine.  Allergies  Allergen Reactions  . Penicillins Hives and Other (See Comments)    Has patient had a PCN reaction causing immediate rash, facial/tongue/throat swelling, SOB or lightheadedness with hypotension: Yes Has patient had a PCN reaction causing severe rash involving mucus membranes or skin necrosis: No Has patient had a PCN reaction that required hospitalization No Has patient had a PCN reaction occurring within the last 10 years: No If all of the above answers are "NO", then may proceed with Cephalosporin use. Pt tolerates cefepime and ceftriaxone  . Gabapentin Itching and Swelling  . Lyrica [Pregabalin] Swelling and Other (See Comments)    Facial and leg swelling  . Saphris [Asenapine]  Swelling and Other (See Comments)    Facial swelling  . Tramadol Swelling and Other (See Comments)    Leg swelling  . Adhesive [Tape] Itching    SEVERE ITCHING  . Latex Itching    SEVERE ITCHING  . Other Other (See Comments)    NO BLOOD PRODUCTS; PATIENT IS A JEHOVAH'S WITNESS    Family History  Problem Relation Age of Onset  . Hypertension Mother   . Diabetes Mother   . Hypertension Father   . Diabetes Father     Prior to Admission medications   Medication Sig Start Date End Date Taking? Authorizing Provider  albuterol (PROVENTIL HFA;VENTOLIN HFA) 108 (90 Base) MCG/ACT inhaler Inhale 2 puffs into the lungs every 4 (four) hours as needed for wheezing or shortness of breath. 10/15/16  Yes Horton, Barbette Hair, MD   atorvastatin (LIPITOR) 40 MG tablet Take 1 tablet (40 mg total) by mouth at bedtime. 09/02/16  Yes Hildred Priest, MD  calcitRIOL (ROCALTROL) 0.25 MCG capsule Take 7 capsules (1.75 mcg total) by mouth every dialysis (3 times per week on TTS). Patient taking differently: Take 1 mcg by mouth every dialysis (3 times per week on TTS).  10/10/18  Yes Ghimire, Henreitta Leber, MD  diphenhydrAMINE (BENADRYL) 25 mg capsule Take 25 mg by mouth every 6 (six) hours as needed for itching.    Yes [provider]  docusate sodium (COLACE) 100 MG capsule Take 100 mg by mouth 2 (two) times daily.   Yes [provider]  DULoxetine (CYMBALTA) 30 MG capsule Take 30 mg by mouth daily. Take with 60 for a total of 90   Yes [provider]  DULoxetine (CYMBALTA) 60 MG capsule Take 60 mg by mouth daily. Take one capsule with 30mg  to equal 90mg .   Yes [provider]  epoetin alfa (EPOGEN,PROCRIT) 28315 UNIT/ML injection Inject 1 mL (20,000 Units total) into the vein Every Tuesday,Thursday,and Saturday with dialysis. 10/10/18  Yes Ghimire, Henreitta Leber, MD  Ferrous Sulfate (SLOW FE) 142 (45 Fe) MG TBCR Take 1 tablet by mouth daily. 07/19/17  Yes Mariel Aloe, MD  folic acid (FOLVITE) 1 MG tablet Take 1 tablet (1 mg total) by mouth daily. 10/10/18  Yes Ghimire, Henreitta Leber, MD  insulin aspart (NOVOLOG) 100 UNIT/ML injection 0-9 Units, Subcutaneous, 3 times daily with meals CBG < 70: implement hypoglycemia protocol CBG 70 - 120: 0 units CBG 121 - 150: 1 unit CBG 151 - 200: 2 units CBG 201 - 250: 3 units CBG 251 - 300: 5 units CBG 301 - 350: 7 units CBG 351 - 400: 9 units CBG > 400: call MD 10/10/18  Yes Ghimire, Henreitta Leber, MD  insulin glargine (LANTUS) 100 UNIT/ML injection Inject 20 Units into the skin at bedtime.   Yes [provider]  labetalol (NORMODYNE) 200 MG tablet Take 1 tablet (200 mg total) by mouth 2 (two) times daily. 11/20/18  Yes Fuller Plan A, MD   lidocaine-prilocaine (EMLA) cream Apply 1 application topically See admin instructions. On dialysis days 12/23/18  Yes [provider]  methocarbamol (ROBAXIN) 500 MG tablet Take 500 mg by mouth 3 (three) times daily as needed for muscle spasms.   Yes [provider]  multivitamin (RENA-VIT) TABS tablet Take 1 tablet by mouth at bedtime. 09/02/16  Yes Hildred Priest, MD  mupirocin ointment (BACTROBAN) 2 % Apply 1 application topically as needed (on arm).  12/01/18  Yes [provider]  ondansetron (ZOFRAN) 4 MG tablet Take  4 mg by mouth every 8 (eight) hours as needed for nausea or vomiting.   Yes [provider]  Oxycodone HCl 10 MG TABS Take 1 tablet (10 mg total) by mouth every 6 (six) hours as needed (for pain). 11/20/18  Yes Fuller Plan A, MD  pantoprazole (PROTONIX) 40 MG tablet Take 1 tablet (40 mg total) by mouth daily at 6 (six) AM. 07/20/17  Yes Mariel Aloe, MD  saccharomyces boulardii (FLORASTOR) 250 MG capsule Take 250 mg by mouth daily.   Yes [provider]  senna (SENOKOT) 8.6 MG tablet Take 2 tablets by mouth daily.   Yes [provider]  traZODone (DESYREL) 50 MG tablet Take 0.5 tablets (25 mg total) by mouth at bedtime. Patient taking differently: Take 50 mg by mouth at bedtime.  07/19/17  Yes Mariel Aloe, MD  allopurinol (ZYLOPRIM) 100 MG tablet Take 1 tablet (100 mg total) by mouth every Tuesday, Thursday, and Saturday at 6 PM. Patient taking differently: Take 200 mg by mouth daily.  01/25/17   Nita Sells, MD  ALPRAZolam Duanne Moron) 0.5 MG tablet Take 1 tablet (0.5 mg total) by mouth every 8 (eight) hours as needed for anxiety. 11/20/18   Norval Morton, MD  amLODipine (NORVASC) 10 MG tablet Take 1 tablet (10 mg total) by mouth at bedtime. Patient not taking: Reported on 12/29/2018 09/02/16   Hildred Priest, MD  calcium acetate (PHOSLO) 667 MG capsule Take 2 capsules (1,334 mg total) by mouth  3 (three) times daily with meals. Patient not taking: Reported on 01/20/2019 09/02/16   Hildred Priest, MD  hydrocortisone (ANUSOL-HC) 2.5 % rectal cream Place rectally 2 (two) times daily. Patient not taking: Reported on 12/29/2018 01/24/17   Nita Sells, MD  Insulin Detemir (LEVEMIR FLEXTOUCH) 100 UNIT/ML Pen Inject 12 Units into the skin at bedtime. Patient not taking: Reported on 01/20/2019 10/10/18   Jonetta Osgood, MD    Physical Exam:  Constitutional: Middle-aged female appears to be NAD, calm, comfortable Vitals:   01/20/19 0712 01/20/19 0900  BP: (!) 157/75 (!) 119/57  Pulse: 86 79  Resp: 17 15  Temp: 97.6 F (36.4 C)   TempSrc: Oral   SpO2: 98% 99%  Weight: 74.4 kg   Height: 5\' 4"  (1.626 m)    Eyes: PERRL, lids and conjunctivae normal ENMT: Mucous membranes are moist. Posterior pharynx clear of any exudate or lesions.  Neck: normal, supple, no masses, no thyromegaly Respiratory: clear to auscultation bilaterally, no wheezing, no crackles. Normal respiratory effort. No accessory muscle use.  Cardiovascular: Regular rate and rhythm, no murmurs / rubs / gallops. No extremity edema. 2+ pedal pulses. No carotid bruits.  Abdomen:  Left lower quadrant tenderness palpation.  No significant hernia appreciated. Patient had dark bloody stools with clots present all in the room. Musculoskeletal: no clubbing / cyanosis. No joint deformity upper and lower extremities. Good ROM, no contractures. Normal muscle tone.  Skin: Multiple skin lesions noted without significant signs of infection or inflammation Neurologic: CN 2-12 grossly intact. Sensation intact, DTR normal. Strength 5/5 in all 4.  Psychiatric: Normal judgment and insight. Alert and oriented x 3. Normal mood.     Labs on Admission: I have personally reviewed following labs and imaging studies  CBC: Recent Labs  Lab 01/20/19 0741 01/20/19 0748  WBC 3.2*  --   NEUTROABS 1.9  --   HGB 5.6* 6.1*  HCT  17.9* 18.0*  MCV 109.8*  --   PLT 124*  --  Basic Metabolic Panel: Recent Labs  Lab 01/20/19 0741 01/20/19 0748  NA 134* 129*  K 6.4* 6.2*  CL 92* 96*  CO2 24  --   GLUCOSE 177* 168*  BUN 116* 126*  CREATININE 8.54* 9.30*  CALCIUM 9.3  --    GFR: Estimated Creatinine Clearance: 7 mL/min (A) (by C-G formula based on SCr of 9.3 mg/dL (H)). Liver Function Tests: Recent Labs  Lab 01/20/19 0741  AST 47*  ALT 45*  ALKPHOS 133*  BILITOT 0.8  PROT 6.9  ALBUMIN 3.3*   No results for input(s): LIPASE, AMYLASE in the last 168 hours. No results for input(s): AMMONIA in the last 168 hours. Coagulation Profile: No results for input(s): INR, PROTIME in the last 168 hours. Cardiac Enzymes: No results for input(s): CKTOTAL, CKMB, CKMBINDEX, TROPONINI in the last 168 hours. BNP (last 3 results) No results for input(s): PROBNP in the last 8760 hours. HbA1C: No results for input(s): HGBA1C in the last 72 hours. CBG: No results for input(s): GLUCAP in the last 168 hours. Lipid Profile: No results for input(s): CHOL, HDL, LDLCALC, TRIG, CHOLHDL, LDLDIRECT in the last 72 hours. Thyroid Function Tests: No results for input(s): TSH, T4TOTAL, FREET4, T3FREE, THYROIDAB in the last 72 hours. Anemia Panel: No results for input(s): VITAMINB12, FOLATE, FERRITIN, TIBC, IRON, RETICCTPCT in the last 72 hours. Urine analysis:    Component Value Date/Time   COLORURINE YELLOW 02/25/2017 1224   APPEARANCEUR HAZY (A) 02/25/2017 1224   LABSPEC 1.013 02/25/2017 1224   PHURINE 8.0 02/25/2017 1224   GLUCOSEU >=500 (A) 02/25/2017 1224   HGBUR SMALL (A) 02/25/2017 1224   BILIRUBINUR NEGATIVE 02/25/2017 1224   BILIRUBINUR neg 12/13/2014 1127   KETONESUR NEGATIVE 02/25/2017 1224   PROTEINUR >=300 (A) 02/25/2017 1224   UROBILINOGEN 0.2 05/01/2015 1252   NITRITE NEGATIVE 02/25/2017 1224   LEUKOCYTESUR NEGATIVE 02/25/2017 1224   Sepsis Labs: No results found for this or any previous visit (from  the past 240 hour(s)).   Radiological Exams on Admission: No results found.  EKG: Independently reviewed.  Sinus rhythm at 85 bpm with signs of T wave peaking.  QTc 495.  Assessment/Plan Acute blood loss anemia, GI bleed: Acute on chronic.  Patient presents with complaints of rectal bleeding found to have hemoglobin 5.6.  Previous admissions with rectal bleeding noted in the past.  Patient currently declining blood products even if it could result in death due to her belief as a Jehovah's Witness.  Previously patient has accepted blood products in the past -Admit to a medical telemetry bed -Clear liquid diet -Serial monitoring of H&H -Protonix p.o. -GI consulted, will follow for further recommendation  Hyperkalemia: Initial potassium noted to be 6.4 on admission.  Patient had recently missed her HD session yesterday. Given 10 g of Lokelma in the ED.  Nephrology aware. -HD per nephrology  ESRD on HD: Patient normally dialyzes T/Th/Sat, but missed recent HD session on 6/16.  Potassium elevated at 6.4, BUN and 116, creatinine 8.54.  Nephrology consulted in ED -Continue Epogen reaction with HD -HD per nephrology   Diabetes mellitus type 2: Last hemoglobin A1c noted to be 5.9. -Hypoglycemic protocol -Continue home long-acting insulin nightly -CBGs q. AC with sensitive SSI  Essential hypertension: Blood pressures currently stable -Hold labetalol   Chronic pain -Continue Cymbalta, oxycodone as needed for pain  Prolonged QT: QTc noted to be prolonged at 495. -Correcting electrolyte derangements -Recheck EKG in a.m.  Anxiety and depression: -Continue Cymbalta and Xanax as needed  Hyperlipidemia -Continue atorvastatin  DVT prophylaxis: SCD Code Status: Full Family Communication: No family present at bedside Disposition Plan: To be determined Consults called: Nephrology, GI Admission status: Inpatient  Norval Morton MD [facility] Pager [phone]   If 7PM-7AM,  please contact night-coverage [email or web address] Password [facility]  01/20/2019, 9:13 AM

## 2019-01-20 NOTE — ED Notes (Signed)
Md aware of 6.4 K+; no further orders received

## 2019-01-20 NOTE — Progress Notes (Signed)
Patient is refusing bed alarm at this time.   Farley Ly RN

## 2019-01-20 NOTE — ED Provider Notes (Addendum)
[facility] EMERGENCY DEPARTMENT Provider Note   CSN: [id number] Arrival date & time: 01/20/19  7341    History   Chief Complaint Chief Complaint  Patient presents with  . GI Bleeding  . Wound Check    HPI [name] is a 53 y.o. female hx of ESRD on HD (last HD was Sat or 5 days ago), here presenting with bright red blood per rectum.  Patient was recently admitted and had endoscopy and colonoscopy that showed some small polyps that were removed.  Patient is told to continue Protonix which she did.  She states that she was hurting all over so missed her dialysis 2 days ago.  Her last dialysis was 5 days ago.  Denies any chest pain or shortness of breath.  Patient states that she has been having some headaches and abdominal cramps.  She states that she had 2 episodes of bright red blood per rectum this morning.  Patient denies any fevers or chills or recent travels. Patient denies any sick contacts      The history is provided by the patient.    Past Medical History:  Diagnosis Date  . Anemia   . Anxiety   . Bipolar disorder, unspecified ([place]) 02/06/2014  . Chronic combined systolic and diastolic CHF (congestive heart failure) (HCC)    EF 40% by echo and 48% by stress test  . Chronic pain syndrome   . Depression   . ESRD on hemodialysis (HCC)    TTS  . GERD (gastroesophageal reflux disease)   . Glaucoma   . Headache    sinus headaches  . Hepatitis C   . High cholesterol   . History of hiatal hernia   . History of vitamin D deficiency 06/20/2015  . Hypertension   . MRSA infection ?2014   "on the back of my head; spread throughout my bloodstream"  . Neuropathy   . Refusal of blood transfusions as patient is Jehovah's Witness   . Schizoaffective disorder, unspecified condition 02/08/2014  . Seizure [facility]) dx'd 2014   "don't know what kind; last one was ~ 04/2014"  . TIA (transient ischemic attack) 2010  . Type II diabetes mellitus ([place])    INSULIN  DEPENDENT    Patient Active Problem List   Diagnosis Date Noted  . Melena   . Rectal bleed 12/30/2018  . Gastritis and gastroduodenitis   . Benign neoplasm of ascending colon   . Benign neoplasm of transverse colon   . Benign neoplasm of descending colon   . GI bleeding 11/17/2018  . Acute blood loss anemia   . No blood products   . GI bleed 10/05/2018  . Prolonged QT interval 10/05/2018  . Localized edema due to fluid overload 09/05/2017  . Lower GI bleed   . Evaluation by psychiatric service required 07/17/2017  . Volume overload 06/03/2017  . Right supracondylar humerus fracture, with nonunion, subsequent encounter 05/01/2017  . Malnutrition of moderate degree 02/28/2017  . Leg pain, left 02/25/2017  . Left leg pain 02/25/2017  . External hemorrhoids   . Diverticulosis of colon without hemorrhage   . Goals of care, counseling/discussion   . Palliative care encounter   . Cellulitis of left leg 01/16/2017  . Upper GI bleed 01/16/2017  . UGIB (upper gastrointestinal bleed)   . Anemia due to chronic kidney disease   . Cellulitis of left thigh 01/01/2017  . ESRD on dialysis ([place]) 01/01/2017  . Cocaine use disorder, severe, dependence ([place]) 08/29/2016  .  Hepatitis C 08/29/2016  . Diabetic ulcer of calf ([place]) 08/29/2016  . Diabetic ulcer of toe ([place]) 08/29/2016  . Cerebrovascular accident (CVA) ([place]) 08/29/2016  . MDD (major depressive disorder) 08/28/2016  . Dyslipidemia associated with type 2 diabetes mellitus ([place]) 11/10/2015  . Hyperkalemia 09/17/2015  . History of vitamin D deficiency 06/20/2015  . Long term prescription opiate use 06/20/2015  . Chronic pain syndrome 06/19/2015  . Anemia of chronic kidney failure 05/01/2015  . transmetatarsal amputation of the left foot 12/13/2014  . Unilateral visual loss 11/22/2014  . 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 ([place]) 11/22/2014  . Essential hypertension 11/09/2014  . Dizziness   . Chronic combined systolic  (congestive) and diastolic (congestive) heart failure ([place])   . Rectal bleeding 04/23/2014  . Anemia, iron deficiency 04/13/2014  . Noncompliance 02/02/2014  . Hyponatremia 05/19/2013  . Seizure disorder ([place]) 02/09/2013    Past Surgical History:  Procedure Laterality Date  . ABDOMINAL HYSTERECTOMY  2014  . ABDOMINAL HYSTERECTOMY  2010  . AMPUTATION Left 05/21/2013   Procedure: Left Midfoot AMPUTATION;  Surgeon: Newt Minion, MD;  Location: [place];  Service: Orthopedics;  Laterality: Left;  Left Midfoot Amputation  . AV FISTULA PLACEMENT Left 05/04/2015   Procedure: ARTERIOVENOUS (AV) GRAFT INSERTION;  Surgeon: Angelia Mould, MD;  Location: [place];  Service: Vascular;  Laterality: Left;  . BIOPSY  11/18/2018   Procedure: BIOPSY;  Surgeon: Jerene Bears, MD;  Location: [facility] END[facility]COPY;  Service: Gastroenterology;;  . COLONOSCOPY WITH PROPOFOL N/A 06/22/2013   Procedure: COLONOSCOPY WITH PROPOFOL;  Surgeon: Jeryl Columbia, MD;  Location: [facility] ENDOSCOPY;  Service: Endoscopy;  Laterality: N/A;  . COLONOSCOPY WITH PROPOFOL N/A 11/18/2018   Procedure: COLONOSCOPY WITH PROPOFOL;  Surgeon: Jerene Bears, MD;  Location: [place];  Service: Gastroenterology;  Laterality: N/A;  . ENTEROSCOPY N/A 12/31/2018   Procedure: ENTEROSCOPY;  Surgeon: Irene Shipper, MD;  Location: [facility] ENDOSCOPY;  Service: Endoscopy;  Laterality: N/A;  Push enteroscopy at 8am (spoke with Endo)  . ESOPHAGOGASTRODUODENOSCOPY N/A 05/11/2015   Procedure: ESOPHAGOGASTRODUODENOSCOPY (EGD);  Surgeon: Laurence Spates, MD;  Location: [facility] ENDOSCOPY;  Service: Endoscopy;  Laterality: N/A;  . ESOPHAGOGASTRODUODENOSCOPY (EGD) WITH PROPOFOL N/A 11/18/2018   Procedure: ESOPHAGOGASTRODUODENOSCOPY (EGD) WITH PROPOFOL;  Surgeon: Jerene Bears, MD;  Location: [facility] ENDOSCOPY;  Service: Gastroenterology;  Laterality: N/A;  . EYE SURGERY Bilateral 2010   Lasik  . FLEXIBLE SIGMOIDOSCOPY N/A 01/17/2017   Procedure: FLEXIBLE SIGMOIDOSCOPY w/ hemorrhoid  banding possible;  Surgeon: Gatha Mayer, MD;  Location: [facility] END[facility]COPY;  Service: Endoscopy;  Laterality: N/A;  . FOOT AMPUTATION Left    due diabetic neuropathy, could not feel ulcer on bottom of foot  . POLYPECTOMY  11/18/2018   Procedure: POLYPECTOMY;  Surgeon: Jerene Bears, MD;  Location: [facility] ENDOSCOPY;  Service: Gastroenterology;;  . REFRACTIVE SURGERY Bilateral 2013  . TONSILLECTOMY  1970's  . TUBAL LIGATION  2000     OB History    Gravida  0   Para  0   Term  0   Preterm  0   AB  0   Living        SAB  0   TAB  0   Ectopic  0   Multiple      Live Births               Home Medications    Prior to Admission medications   Medication Sig Start Date End Date Taking?  Authorizing Provider  albuterol (PROVENTIL HFA;VENTOLIN HFA) 108 (90 Base) MCG/ACT inhaler Inhale 2 puffs into the lungs every 4 (four) hours as needed for wheezing or shortness of breath. 10/15/16  Yes [place], Barbette Hair, MD  allopurinol (ZYLOPRIM) 100 MG tablet Take 1 tablet (100 mg total) by mouth every Tuesday, Thursday, and Saturday at 6 PM. Patient taking differently: Take 200 mg by mouth daily.  01/25/17  Yes Nita Sells, MD  ALPRAZolam Duanne Moron) 0.5 MG tablet Take 1 tablet (0.5 mg total) by mouth every 8 (eight) hours as needed for anxiety. 11/20/18  Yes Fuller Plan A, MD  atorvastatin (LIPITOR) 40 MG tablet Take 1 tablet (40 mg total) by mouth at bedtime. 09/02/16  Yes Hildred Priest, MD  calcitRIOL (ROCALTROL) 0.25 MCG capsule Take 7 capsules (1.75 mcg total) by mouth every dialysis (3 times per week on TTS). Patient taking differently: Take 1 mcg by mouth every dialysis (3 times per week on TTS). Taking 4 capsules (0.35mcg) = 31mcg 10/10/18  Yes Ghimire, Henreitta Leber, MD  diphenhydrAMINE (BENADRYL) 25 mg capsule Take 25 mg by mouth every 6 (six) hours as needed for itching.    Yes [provider]  docusate sodium (COLACE) 100 MG capsule Take 100 mg by mouth 2 (two)  times daily.   Yes [provider]  DULoxetine (CYMBALTA) 30 MG capsule Take 30 mg by mouth daily. Take with 60 for a total of 90   Yes [provider]  DULoxetine (CYMBALTA) 60 MG capsule Take 60 mg by mouth daily. Take one capsule with 30mg  to equal 90mg .   Yes [provider]  epoetin alfa (EPOGEN,PROCRIT) 40973 UNIT/ML injection Inject 1 mL (20,000 Units total) into the vein Every Tuesday,Thursday,and Saturday with dialysis. 10/10/18  Yes Ghimire, Henreitta Leber, MD  Ferrous Sulfate (SLOW FE) 142 (45 Fe) MG TBCR Take 1 tablet by mouth daily. 07/19/17  Yes Mariel Aloe, MD  folic acid (FOLVITE) 1 MG tablet Take 1 tablet (1 mg total) by mouth daily. 10/10/18  Yes Ghimire, Henreitta Leber, MD  insulin aspart (NOVOLOG) 100 UNIT/ML injection 0-9 Units, Subcutaneous, 3 times daily with meals CBG < 70: implement hypoglycemia protocol CBG 70 - 120: 0 units CBG 121 - 150: 1 unit CBG 151 - 200: 2 units CBG 201 - 250: 3 units CBG 251 - 300: 5 units CBG 301 - 350: 7 units CBG 351 - 400: 9 units CBG > 400: call MD 10/10/18  Yes Ghimire, Henreitta Leber, MD  insulin glargine (LANTUS) 100 UNIT/ML injection Inject 20 Units into the skin at bedtime.   Yes [provider]  labetalol (NORMODYNE) 200 MG tablet Take 1 tablet (200 mg total) by mouth 2 (two) times daily. 11/20/18  Yes Fuller Plan A, MD  lidocaine-prilocaine (EMLA) cream Apply 1 application topically See admin instructions. On dialysis days 12/23/18  Yes [provider]  methocarbamol (ROBAXIN) 500 MG tablet Take 500 mg by mouth 3 (three) times daily as needed for muscle spasms.   Yes [provider]  multivitamin (RENA-VIT) TABS tablet Take 1 tablet by mouth at bedtime. 09/02/16  Yes Hildred Priest, MD  mupirocin ointment (BACTROBAN) 2 % Apply 1 application topically as needed (on arm).  12/01/18  Yes [provider]  ondansetron (ZOFRAN) 4 MG tablet Take 4 mg by mouth every 8 (eight) hours as  needed for nausea or vomiting.   Yes [provider]  Oxycodone HCl 10 MG TABS Take 1 tablet (10 mg total) by mouth  every 6 (six) hours as needed (for pain). 11/20/18  Yes Fuller Plan A, MD  pantoprazole (PROTONIX) 40 MG tablet Take 1 tablet (40 mg total) by mouth daily at 6 (six) AM. 07/20/17  Yes Mariel Aloe, MD  saccharomyces boulardii (FLORASTOR) 250 MG capsule Take 250 mg by mouth daily.   Yes [provider]  senna (SENOKOT) 8.6 MG tablet Take 2 tablets by mouth daily.   Yes [provider]  traZODone (DESYREL) 50 MG tablet Take 0.5 tablets (25 mg total) by mouth at bedtime. Patient taking differently: Take 50 mg by mouth at bedtime.  07/19/17  Yes Mariel Aloe, MD  amLODipine (NORVASC) 10 MG tablet Take 1 tablet (10 mg total) by mouth at bedtime. Patient not taking: Reported on 12/29/2018 09/02/16   Hildred Priest, MD  calcium acetate (PHOSLO) 667 MG capsule Take 2 capsules (1,334 mg total) by mouth 3 (three) times daily with meals. Patient not taking: Reported on 01/20/2019 09/02/16   Hildred Priest, MD  hydrocortisone (ANUSOL-HC) 2.5 % rectal cream Place rectally 2 (two) times daily. Patient not taking: Reported on 12/29/2018 01/24/17   Nita Sells, MD  Insulin Detemir (LEVEMIR FLEXTOUCH) 100 UNIT/ML Pen Inject 12 Units into the skin at bedtime. Patient not taking: Reported on 01/20/2019 10/10/18   Jonetta Osgood, MD    Family History Family History  Problem Relation Age of Onset  . Hypertension Mother   . Diabetes Mother   . Hypertension Father   . Diabetes Father     Social History Social History   Tobacco Use  . Smoking status: Current Some Day Smoker    Packs/day: 1.00    Years: 36.00    Pack years: 36.00    Types: Cigarettes  . Smokeless tobacco: Never Used  . Tobacco comment: 5 cigs in one setting  Substance Use Topics  . Alcohol use: Yes    Alcohol/week: 3.0 standard drinks    Types: 3 Standard  drinks or equivalent per week    Frequency: Never    Comment: last use about 2 weeks ago  . Drug use: Not Currently    Types: "Crack" cocaine, Cocaine    Comment: last use >2 years ago     Allergies   Penicillins, Gabapentin, Lyrica [pregabalin], Saphris [asenapine], Tramadol, Adhesive [tape], Latex, and Other   Review of Systems Review of Systems  Gastrointestinal: Positive for blood in stool.  All other systems reviewed and are negative.    Physical Exam Updated Vital Signs BP (!) 119/57   Pulse 79   Temp 97.6 F (36.4 C) (Oral)   Resp 15   Ht 5\' 4"  (1.626 m)   Wt 74.4 kg   LMP  (LMP Unknown)   SpO2 99%   BMI 28.15 kg/m   Physical Exam Vitals signs and nursing note reviewed.  HENT:     Head: Normocephalic.     Nose: Nose normal.     Mouth/Throat:     Mouth: Mucous membranes are moist.  Eyes:     Comments: Conjunctiva slightly pale   Neck:     Musculoskeletal: Normal range of motion.  Cardiovascular:     Rate and Rhythm: Normal rate.     Pulses: Normal pulses.  Pulmonary:     Effort: Pulmonary effort is normal.  Abdominal:     General: Abdomen is flat.     Palpations: Abdomen is soft.     Comments: nontender   Genitourinary:    Comments: Rectal- bright red  blood, no obvious hemorrhoids  Musculoskeletal: Normal range of motion.  Skin:    General: Skin is warm.     Capillary Refill: Capillary refill takes less than 2 seconds.  Neurological:     General: No focal deficit present.     Mental Status: She is alert and oriented to person, place, and time.  Psychiatric:        Behavior: Behavior normal.      ED Treatments / Results  Labs (all labs ordered are listed, but only abnormal results are displayed) Labs Reviewed  CBC WITH DIFFERENTIAL/PLATELET - Abnormal; Notable for the following components:      Result Value   WBC 3.2 (*)    RBC 1.63 (*)    Hemoglobin 5.6 (*)    HCT 17.9 (*)    MCV 109.8 (*)    MCH 34.4 (*)    Platelets 124 (*)     All other components within normal limits  COMPREHENSIVE METABOLIC PANEL - Abnormal; Notable for the following components:   Sodium 134 (*)    Potassium 6.4 (*)    Chloride 92 (*)    Glucose, Bld 177 (*)    BUN 116 (*)    Creatinine, Ser 8.54 (*)    Albumin 3.3 (*)    AST 47 (*)    ALT 45 (*)    Alkaline Phosphatase 133 (*)    GFR calc non Af Amer 5 (*)    GFR calc Af Amer 6 (*)    Anion gap 18 (*)    All other components within normal limits  I-STAT CHEM 8, ED - Abnormal; Notable for the following components:   Sodium 129 (*)    Potassium 6.2 (*)    Chloride 96 (*)    BUN 126 (*)    Creatinine, Ser 9.30 (*)    Glucose, Bld 168 (*)    Calcium, Ion 1.02 (*)    Hemoglobin 6.1 (*)    HCT 18.0 (*)    All other components within normal limits  POC OCCULT BLOOD, ED - Abnormal; Notable for the following components:   Fecal Occult Bld POSITIVE (*)    All other components within normal limits  NOVEL CORONAVIRUS, NAA (HOSPITAL ORDER, SEND-OUT TO REF LAB)  TYPE AND SCREEN    EKG EKG Interpretation  Date/Time:  Wednesday January 20 2019 07:34:22 EDT Ventricular Rate:  85 PR Interval:    QRS Duration: 107 QT Interval:  416 QTC Calculation: 495 R Axis:   115 Text Interpretation:  Age not entered, assumed to be  53 years old for purpose of ECG interpretation Sinus rhythm Left atrial enlargement Left posterior fascicular block Consider left ventricular hypertrophy Borderline prolonged QT interval No significant change since last tracing Confirmed by Wandra Arthurs [phone]) on 01/20/2019 7:38:29 AM Also confirmed by Wandra Arthurs [phone]), editor Philomena Doheny [phone])  on 01/20/2019 7:45:37 AM   Radiology No results found.  Procedures Procedures (including critical care time)  CRITICAL CARE Performed by: Wandra Arthurs   Total critical care time: 30 minutes  Critical care time was exclusive of separately billable procedures and treating other patients.  Critical care was necessary to  treat or prevent imminent or life-threatening deterioration.  Critical care was time spent personally by me on the following activities: development of treatment plan with patient and/or surrogate as well as nursing, discussions with consultants, evaluation of patient's response to treatment, examination of patient, obtaining history from patient or surrogate, ordering and performing  treatments and interventions, ordering and review of laboratory studies, ordering and review of radiographic studies, pulse oximetry and re-evaluation of patient's condition.   Medications Ordered in ED Medications  Chlorhexidine Gluconate Cloth 2 % PADS 6 each (has no administration in time range)  fentaNYL (SUBLIMAZE) injection 50 mcg (50 mcg Intravenous Given 01/20/19 0758)  sodium zirconium cyclosilicate (LOKELMA) packet 10 g (10 g Oral Given 01/20/19 0849)  sodium bicarbonate injection 50 mEq (50 mEq Intravenous Given 01/20/19 0849)     Initial Impression / Assessment and Plan / ED Course  I have reviewed the triage vital signs and the nursing notes.  Pertinent labs & imaging results that were available during my care of the patient were reviewed by me and considered in my medical decision making (see chart for details).       [name] is a 53 y.o. female hx of ESRD on HD (last HD 5 days ago) here with rectal bleeding.  This is a recurrent problem and patient had recent endoscopy and colonoscopy that showed some small polyps.  Patient is also out of her medicines and has not been going to dialysis. Will check electrolytes to make sure she is not hyperkalemic.  Will also check a hemoglobin as she had required recent transfusion.   8:50 AM Patient's Hg is 5.6. Occ positive.  Type and screen was sent.  I had a long discussion regarding receiving blood transfusion.  Patient states that she used to be a [name] but she received transfusion during last admission.  However, she felt like this was not  good for her faith and she wanted to be Jehovah's Witness again.  She just did not want to live like this anymore.  I counseled her regarding the risks and benefits of receiving blood and how it will make her feel better but right now she does not want blood transfusion.  She understands the wrist including more shortness of breath and potential for death.  She also was hyperkalemic to 6.2.  I ordered some Lokelma as well as bicarb.  I talked to Dr. Moshe Cipro from nephrology, who will dialyze patient. I called [facility] to see patient again. Patient will be readmitted to hospitalist for recurrent GI bleed with anemia, hyperkalemia.   9:29 AM [facility] called back and patient is unassigned. I talked to Dr. Therisa Doyne from [facility], who will follow    Final Clinical Impressions(s) / ED Diagnoses   Final diagnoses:  Acute GI bleeding  Anemia, unspecified type  Hyperkalemia    ED Discharge Orders    None       Drenda Freeze, MD 01/20/19 2956    Drenda Freeze, MD 01/20/19 [id number]

## 2019-01-20 NOTE — Consult Note (Signed)
Referring Provider: Dr. Fuller Plan Primary Care Physician:  Patient, [name] Primary Gastroenterologist: None (unassigned)  Reason for Consultation: Bleeding and recurrent anemia  HPI: [name] is a 53 y.o. female [place] Witness admitted through the emergency room with a 1 day history of small-volume hematochezia in association with bowel movements, without significant abdominal pain, and noted to have a drop in hemoglobin from a previous value of 7.2 on May 28 to a current level of 5.6.  This patient has required recurrent admissions for anemia, and the frequency of these admissions seems to be accelerating, with admissions in March, April, and May for that reason.  In March, she did undergo a blood transfusion which she has normally refused.  In April, she had endoscopy and colonoscopy by Dr. Zenovia Jarred which were negative for any source of bleeding or recurrent anemia (several small non-adenomatous polyps were removed on the colonoscopy, and the upper endoscopy showed some slight gastritis with biopsies negative for Helicobacter pylori infection).  In May, the patient underwent push enteroscopy by Dr. Scarlette Shorts which was also negative.  She has never had a capsule endoscopy.  Symptomatically, the patient is fairly stable.  No anorexia, weight loss, or significant dyspeptic symptoms.  Lately, it has hurt for her to lie on her left side.  Yesterday, she had an ordinary bowel movement with small amounts of blood that then turned to "globs" with her bowel movement.  However, it does not sound as though there was any sharp perianal pain to suggest an anal fissure.  On rectal exam in the emergency room, bright red blood was noted, without obvious hemorrhoids.   Past Medical History:  Diagnosis Date  . Anemia   . Anxiety   . Bipolar disorder, unspecified ([place]) 02/06/2014  . Chronic combined systolic and diastolic CHF (congestive heart failure) (HCC)    EF 40% by echo and 48% by stress  test  . Chronic pain syndrome   . Depression   . ESRD on hemodialysis (HCC)    TTS  . GERD (gastroesophageal reflux disease)   . Glaucoma   . Headache    sinus headaches  . Hepatitis C   . High cholesterol   . History of hiatal hernia   . History of vitamin D deficiency 06/20/2015  . Hypertension   . MRSA infection ?2014   "on the back of my head; spread throughout my bloodstream"  . Neuropathy   . Refusal of blood transfusions as patient is Jehovah's Witness   . Schizoaffective disorder, unspecified condition 02/08/2014  . Seizure [employer]) dx'd 2014   "don't know what kind; last one was ~ 04/2014"  . TIA (transient ischemic attack) 2010  . Type II diabetes mellitus ([place])    INSULIN DEPENDENT    Past Surgical History:  Procedure Laterality Date  . ABDOMINAL HYSTERECTOMY  2014  . ABDOMINAL HYSTERECTOMY  2010  . AMPUTATION Left 05/21/2013   Procedure: Left Midfoot AMPUTATION;  Surgeon: Newt Minion, MD;  Location: [place];  Service: Orthopedics;  Laterality: Left;  Left Midfoot Amputation  . AV FISTULA PLACEMENT Left 05/04/2015   Procedure: ARTERIOVENOUS (AV) GRAFT INSERTION;  Surgeon: Angelia Mould, MD;  Location: [place];  Service: Vascular;  Laterality: Left;  . BIOPSY  11/18/2018   Procedure: BIOPSY;  Surgeon: Jerene Bears, MD;  Location[facility] END[facility]COPY;  Service: Gastroenterology;;  . COLONOSCOPY WITH PROPOFOL N/A 06/22/2013   Procedure: COLONOSCOPY WITH PROPOFOL;  Surgeon: Jeryl Columbia, MD;  Location:  [facility] ENDOSCOPY;  Service: Endoscopy;  Laterality: N/A;  . COLONOSCOPY WITH PROPOFOL N/A 11/18/2018   Procedure: COLONOSCOPY WITH PROPOFOL;  Surgeon: Jerene Bears, MD;  Location: [place];  Service: Gastroenterology;  Laterality: N/A;  . ENTEROSCOPY N/A 12/31/2018   Procedure: ENTEROSCOPY;  Surgeon: Irene Shipper, MD;  Location: [facility] ENDOSCOPY;  Service: Endoscopy;  Laterality: N/A;  Push enteroscopy at 8am (spoke with Endo)  . ESOPHAGOGASTRODUODENOSCOPY N/A 05/11/2015    Procedure: ESOPHAGOGASTRODUODENOSCOPY (EGD);  Surgeon: Laurence Spates, MD;  Location: [facility] ENDOSCOPY;  Service: Endoscopy;  Laterality: N/A;  . ESOPHAGOGASTRODUODENOSCOPY (EGD) WITH PROPOFOL N/A 11/18/2018   Procedure: ESOPHAGOGASTRODUODENOSCOPY (EGD) WITH PROPOFOL;  Surgeon: Jerene Bears, MD;  Location: [facility] ENDOSCOPY;  Service: Gastroenterology;  Laterality: N/A;  . EYE SURGERY Bilateral 2010   Lasik  . FLEXIBLE SIGMOIDOSCOPY N/A 01/17/2017   Procedure: FLEXIBLE SIGMOIDOSCOPY w/ hemorrhoid banding possible;  Surgeon: Gatha Mayer, MD;  Location: [facility] ENDOSCOPY;  Service: Endoscopy;  Laterality: N/A;  . FOOT AMPUTATION Left    due diabetic neuropathy, could not feel ulcer on bottom of foot  . POLYPECTOMY  11/18/2018   Procedure: POLYPECTOMY;  Surgeon: Jerene Bears, MD;  Location: [facility] ENDOSCOPY;  Service: Gastroenterology;;  . REFRACTIVE SURGERY Bilateral 2013  . TONSILLECTOMY  1970's  . TUBAL LIGATION  2000    Prior to Admission medications   Medication Sig Start Date End Date Taking? Authorizing Provider  albuterol (PROVENTIL HFA;VENTOLIN HFA) 108 (90 Base) MCG/ACT inhaler Inhale 2 puffs into the lungs every 4 (four) hours as needed for wheezing or shortness of breath. 10/15/16  Yes Horton, Barbette Hair, MD  allopurinol (ZYLOPRIM) 100 MG tablet Take 1 tablet (100 mg total) by mouth every Tuesday, Thursday, and Saturday at 6 PM. Patient taking differently: Take 200 mg by mouth daily.  01/25/17  Yes Nita Sells, MD  ALPRAZolam Duanne Moron) 0.5 MG tablet Take 1 tablet (0.5 mg total) by mouth every 8 (eight) hours as needed for anxiety. 11/20/18  Yes Fuller Plan A, MD  atorvastatin (LIPITOR) 40 MG tablet Take 1 tablet (40 mg total) by mouth at bedtime. 09/02/16  Yes Hildred Priest, MD  calcitRIOL (ROCALTROL) 0.25 MCG capsule Take 7 capsules (1.75 mcg total) by mouth every dialysis (3 times per week on TTS). Patient taking differently: Take 1 mcg by mouth every dialysis (3 times per week  on TTS). Taking 4 capsules (0.46mcg) = 23mcg 10/10/18  Yes Ghimire, Henreitta Leber, MD  diphenhydrAMINE (BENADRYL) 25 mg capsule Take 25 mg by mouth every 6 (six) hours as needed for itching.    Yes [provider]  docusate sodium (COLACE) 100 MG capsule Take 100 mg by mouth 2 (two) times daily.   Yes [provider]  DULoxetine (CYMBALTA) 30 MG capsule Take 30 mg by mouth daily. Take with 60 for a total of 90   Yes [provider]  DULoxetine (CYMBALTA) 60 MG capsule Take 60 mg by mouth daily. Take one capsule with 30mg  to equal 90mg .   Yes [provider]  epoetin alfa (EPOGEN,PROCRIT) 16109 UNIT/ML injection Inject 1 mL (20,000 Units total) into the vein Every Tuesday,Thursday,and Saturday with dialysis. 10/10/18  Yes Ghimire, Henreitta Leber, MD  ferric citrate (AURYXIA) 1 GM 210 MG(Fe) tablet Take 420 mg by mouth 3 (three) times daily with meals.   Yes [provider]  Ferrous Sulfate (SLOW FE) 142 (45 Fe) MG TBCR Take 1 tablet by mouth daily. 07/19/17  Yes Mariel Aloe, MD  folic acid (FOLVITE)  1 MG tablet Take 1 tablet (1 mg total) by mouth daily. 10/10/18  Yes Ghimire, Henreitta Leber, MD  insulin aspart (NOVOLOG) 100 UNIT/ML injection 0-9 Units, Subcutaneous, 3 times daily with meals CBG < 70: implement hypoglycemia protocol CBG 70 - 120: 0 units CBG 121 - 150: 1 unit CBG 151 - 200: 2 units CBG 201 - 250: 3 units CBG 251 - 300: 5 units CBG 301 - 350: 7 units CBG 351 - 400: 9 units CBG > 400: call MD 10/10/18  Yes Ghimire, Henreitta Leber, MD  insulin glargine (LANTUS) 100 UNIT/ML injection Inject 20 Units into the skin at bedtime.   Yes [provider]  labetalol (NORMODYNE) 200 MG tablet Take 1 tablet (200 mg total) by mouth 2 (two) times daily. 11/20/18  Yes Fuller Plan A, MD  lidocaine-prilocaine (EMLA) cream Apply 1 application topically See admin instructions. On dialysis days 12/23/18  Yes [provider]  methocarbamol (ROBAXIN) 500 MG tablet  Take 500 mg by mouth 3 (three) times daily as needed for muscle spasms.   Yes [provider]  multivitamin (RENA-VIT) TABS tablet Take 1 tablet by mouth at bedtime. 09/02/16  Yes Hildred Priest, MD  mupirocin ointment (BACTROBAN) 2 % Apply 1 application topically as needed (on arm).  12/01/18  Yes [provider]  ondansetron (ZOFRAN) 4 MG tablet Take 4 mg by mouth every 8 (eight) hours as needed for nausea or vomiting.   Yes [provider]  Oxycodone HCl 10 MG TABS Take 1 tablet (10 mg total) by mouth every 6 (six) hours as needed (for pain). 11/20/18  Yes Fuller Plan A, MD  pantoprazole (PROTONIX) 40 MG tablet Take 1 tablet (40 mg total) by mouth daily at 6 (six) AM. 07/20/17  Yes Mariel Aloe, MD  saccharomyces boulardii (FLORASTOR) 250 MG capsule Take 250 mg by mouth daily.   Yes [provider]  senna (SENOKOT) 8.6 MG tablet Take 2 tablets by mouth daily.   Yes [provider]  traZODone (DESYREL) 50 MG tablet Take 0.5 tablets (25 mg total) by mouth at bedtime. Patient taking differently: Take 50 mg by mouth at bedtime.  07/19/17  Yes Mariel Aloe, MD  amLODipine (NORVASC) 10 MG tablet Take 1 tablet (10 mg total) by mouth at bedtime. Patient not taking: Reported on 12/29/2018 09/02/16   Hildred Priest, MD  hydrocortisone (ANUSOL-HC) 2.5 % rectal cream Place rectally 2 (two) times daily. Patient not taking: Reported on 12/29/2018 01/24/17   Nita Sells, MD  Insulin Detemir (LEVEMIR FLEXTOUCH) 100 UNIT/ML Pen Inject 12 Units into the skin at bedtime. Patient not taking: Reported on 01/20/2019 10/10/18   Jonetta Osgood, MD    Current Facility-Administered Medications  Medication Dose Route Frequency Provider Last Rate Last Dose  . acetaminophen (TYLENOL) tablet 650 mg  650 mg Oral Q6H PRN Norval Morton, MD       Or  . acetaminophen (TYLENOL) suppository 650 mg  650 mg Rectal Q6H PRN Smith, Rondell A, MD       . albuterol (VENTOLIN HFA) 108 (90 Base) MCG/ACT inhaler 2 puff  2 puff Inhalation Q6H PRN Smith, Rondell A, MD      . allopurinol (ZYLOPRIM) tablet 200 mg  200 mg Oral Daily Smith, Rondell A, MD      . ALPRAZolam Duanne Moron) tablet 0.5 mg  0.5 mg Oral Q8H PRN Smith, Rondell A, MD      . atorvastatin (LIPITOR) tablet 40 mg  40 mg  Oral QHS Norval Morton, MD      . Derrill Memo ON 01/21/2019] calcitRIOL (ROCALTROL) capsule 1 mcg  1 mcg Oral Q T,Th,Sat-1800 Smith, Rondell A, MD      . Chlorhexidine Gluconate Cloth 2 % PADS 6 each  6 each Topical Q0600 Lynnda Child, PA-C      . diphenhydrAMINE (BENADRYL) capsule 25 mg  25 mg Oral Q6H PRN Smith, Rondell A, MD      . DULoxetine (CYMBALTA) DR capsule 60 mg  60 mg Oral Daily Norval Morton, MD       And  . DULoxetine (CYMBALTA) DR capsule 30 mg  30 mg Oral Daily Fuller Plan A, MD      . Derrill Memo ON 01/21/2019] epoetin alfa (EPOGEN) injection 20,000 Units  20,000 Units Intravenous Q T,Th,Sa-HD Smith, Rondell A, MD      . ferric citrate (AURYXIA) tablet 420 mg  420 mg Oral TID WC Smith, Rondell A, MD      . folic acid (FOLVITE) tablet 1 mg  1 mg Oral Daily Smith, Rondell A, MD      . insulin aspart (novoLOG) injection 0-9 Units  0-9 Units Subcutaneous TID WC Smith, Rondell A, MD      . insulin glargine (LANTUS) injection 20 Units  20 Units Subcutaneous QHS Smith, Rondell A, MD      . multivitamin (RENA-VIT) tablet 1 tablet  1 tablet Oral QHS Smith, Rondell A, MD      . mupirocin ointment (BACTROBAN) 2 % 1 application  1 application Topical PRN Smith, Rondell A, MD      . oxyCODONE (Oxy IR/ROXICODONE) immediate release tablet 10 mg  10 mg Oral Q6H PRN Fuller Plan A, MD      . pantoprazole (PROTONIX) EC tablet 40 mg  40 mg Oral Q0600 Smith, Rondell A, MD      . saccharomyces boulardii (FLORASTOR) capsule 250 mg  250 mg Oral Daily Smith, Rondell A, MD      . senna (SENOKOT) tablet 17.2 mg  2 tablet Oral Daily Smith, Rondell A, MD      . sodium  chloride flush (NS) 0.9 % injection 3 mL  3 mL Intravenous Q12H Fuller Plan A, MD        Allergies as of 01/20/2019 - Review Complete 01/20/2019  Allergen Reaction Noted  . Penicillins Hives and Other (See Comments) 09/30/2014  . Gabapentin Itching and Swelling 02/09/2013  . Lyrica [pregabalin] Swelling and Other (See Comments) 09/30/2014  . Saphris [asenapine] Swelling and Other (See Comments) 09/30/2014  . Tramadol Swelling and Other (See Comments) 09/30/2014  . Adhesive [tape] Itching 09/09/2017  . Latex Itching 09/09/2017  . Other Other (See Comments) 05/01/2015    Family History  Problem Relation Age of Onset  . Hypertension Mother   . Diabetes Mother   . Hypertension Father   . Diabetes Father     Social History   Socioeconomic History  . Marital status: Single    Spouse name: Not on file  . Number of children: 3  . Years of education: 49  . Highest education level: 12th grade  Occupational History  . Occupation: disabled  Social Needs  . Financial resource strain: Not on file  . Food insecurity    Worry: Not on file    Inability: Not on file  . Transportation needs    Medical: Not on file    Non-medical: Not on file  Tobacco Use  . Smoking status: Current Some  Day Smoker    Packs/day: 1.00    Years: 36.00    Pack years: 36.00    Types: Cigarettes  . Smokeless tobacco: Never Used  . Tobacco comment: 5 cigs in one setting  Substance and Sexual Activity  . Alcohol use: Yes    Alcohol/week: 3.0 standard drinks    Types: 3 Standard drinks or equivalent per week    Frequency: Never    Comment: last use about 2 weeks ago  . Drug use: Not Currently    Types: "Crack" cocaine, Cocaine    Comment: last use >2 years ago  . Sexual activity: Not Currently    Birth control/protection: Abstinence  Lifestyle  . Physical activity    Days per week: Not on file    Minutes per session: Not on file  . Stress: Not on file  Relationships  . Social Product manager on phone: Not on file    Gets together: Not on file    Attends religious service: Not on file    Active member of club or organization: Not on file    Attends meetings of clubs or organizations: Not on file    Relationship status: Not on file  . Intimate partner violence    Fear of current or ex partner: Not on file    Emotionally abused: Not on file    Physically abused: Not on file    Forced sexual activity: Not on file  Other Topics Concern  . Not on file  Social History Narrative   ** Merged History Encounter **   Single, lives with son in a one story home. Unable to exercise. Avoids caffeine. 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        Review of Systems: The patient denies chest pain but does report dyspnea on mild exertion such as walking 20 feet.  No evident anorexia or food intolerance.  Otherwise per HPI  Physical Exam: Vital signs in last 24 hours: Temp:  [97.6 F (36.4 C)-98 F (36.7 C)] 98 F (36.7 C) (06/17 1050) Pulse Rate:  [79-86] 84 (06/17 1050) Resp:  [15-20] 20 (06/17 1050) BP: (119-157)/(57-75) 137/65 (06/17 1050) SpO2:  [98 %-99 %] 98 % (06/17 1050) Weight:  [74.4 kg-75.7 kg] 75.7 kg (06/17 1050) Last BM Date: 01/19/19  This is an African-American female just about to be hooked up to dialysis.  She is in no acute distress whatsoever, is pleasant, cooperative, and coherent.  She is without focal neurologic deficits or peripheral edema.  No evident scleral icterus.  Chest is clear anteriorly, heart normal, abdomen slightly adipose but without mass or tenderness.  Rectal exam was performed earlier by the emergency room physician and was not repeated.  Intake/Output from previous day: No intake/output data recorded. Intake/Output this shift: No intake/output data recorded.  Lab Results: Recent Labs    01/20/19 0741 01/20/19 0748  WBC 3.2*  --   HGB 5.6* 6.1*  HCT 17.9* 18.0*  PLT 124*  --     BMET Recent Labs    01/20/19 0741 01/20/19 0748  NA 134* 129*  K 6.4* 6.2*  CL 92* 96*  CO2 24  --   GLUCOSE 177* 168*  BUN 116* 126*  CREATININE 8.54* 9.30*  CALCIUM 9.3  --    LFT Recent Labs    01/20/19 0741  PROT 6.9  ALBUMIN 3.3*  AST 47*  ALT 45*  ALKPHOS 133*  BILITOT 0.8  PT/INR No results for input(s): LABPROT, INR in the last 72 hours.  Studies/Results: No results found.  Impression: 1.  Recurrent anemia of unclear cause 2.  Small-volume hematochezia  Plan: 1.  Flexible sigmoidoscopy tomorrow morning, unprepped, unsedated to look for internal hemorrhoids, stercoral ulceration, proctitis, or other sources of rectal bleeding.  General safety discussed. 2.  Capsule endoscopy tomorrow to complete evaluation of small intestine for possible sources of chronic anemia   LOS: 0 days   [name] Mighty Ikey [name]  01/20/2019, 12:03 PM   Pager [phone] If no answer or after 5 PM call [phone]

## 2019-01-20 NOTE — Progress Notes (Signed)
Active with [facility],  RN,PT,ST. Whitman Hero RN,BSN,CM

## 2019-01-20 NOTE — ED Notes (Signed)
Pt refusing blood products ,MD aware

## 2019-01-20 NOTE — Progress Notes (Signed)
Patient requested to be on 3 liters of O2. She said she wears oxygen prn at the nursing home. While cleaning her up I, the RN saw that the patient was getting SOB, but oxygen saturation was @ 91% on RA. Patient is now on 3 liters of oxygen, and sating 96%.   Farley Ly RN

## 2019-01-21 ENCOUNTER — Encounter (HOSPITAL_COMMUNITY): Payer: Self-pay

## 2019-01-21 ENCOUNTER — Encounter (HOSPITAL_COMMUNITY)
Admission: EM | Disposition: A | Discharge status: Home Health | Payer: Self-pay | Source: Home / Self Care | Attending: Internal Medicine

## 2019-01-21 DIAGNOSIS — D649 Anemia, unspecified: Secondary | ICD-10-CM

## 2019-01-21 DIAGNOSIS — D61818 Other pancytopenia: Secondary | ICD-10-CM

## 2019-01-21 HISTORY — PX: FLEXIBLE SIGMOIDOSCOPY: SHX5431

## 2019-01-21 HISTORY — PX: GIVENS CAPSULE STUDY: SHX5432

## 2019-01-21 LAB — BASIC METABOLIC PANEL
Anion gap: 11 (ref 5–15)
BUN: 45 mg/dL — ABNORMAL HIGH (ref 6–20)
CO2: 30 mmol/L (ref 22–32)
Calcium: 8.1 mg/dL — ABNORMAL LOW (ref 8.9–10.3)
Chloride: 95 mmol/L — ABNORMAL LOW (ref 98–111)
Creatinine, Ser: 4.42 mg/dL — ABNORMAL HIGH (ref 0.44–1.00)
GFR calc Af Amer: 12 mL/min — ABNORMAL LOW (ref >60)
GFR calc non Af Amer: 11 mL/min — ABNORMAL LOW (ref >60)
Glucose, Bld: 60 mg/dL — ABNORMAL LOW (ref 70–99)
Potassium: 4.9 mmol/L (ref 3.5–5.1)
Sodium: 136 mmol/L (ref 135–145)

## 2019-01-21 LAB — HEMOGLOBIN AND HEMATOCRIT, BLOOD
HCT: 14.7 % — ABNORMAL LOW (ref 36.0–46.0)
HCT: 14.1 % — ABNORMAL LOW (ref 36.0–46.0)
Hemoglobin: 4.7 g/dL — CL (ref 12.0–15.0)
Hemoglobin: 4.5 g/dL — CL (ref 12.0–15.0)

## 2019-01-21 LAB — GLUCOSE, CAPILLARY
Glucose-Capillary: 44 mg/dL — CL (ref 70–99)
Glucose-Capillary: 102 mg/dL — ABNORMAL HIGH (ref 70–99)
Glucose-Capillary: 80 mg/dL (ref 70–99)
Glucose-Capillary: 72 mg/dL (ref 70–99)
Glucose-Capillary: 77 mg/dL (ref 70–99)
Glucose-Capillary: 74 mg/dL (ref 70–99)
Glucose-Capillary: 130 mg/dL — ABNORMAL HIGH (ref 70–99)
Glucose-Capillary: 138 mg/dL — ABNORMAL HIGH (ref 70–99)

## 2019-01-21 LAB — CBC
HCT: 13.3 % — ABNORMAL LOW (ref 36.0–46.0)
Hemoglobin: 4.2 g/dL — CL (ref 12.0–15.0)
MCH: 33.9 pg (ref 26.0–34.0)
MCHC: 31.6 g/dL (ref 30.0–36.0)
MCV: 107.3 fL — ABNORMAL HIGH (ref 80.0–100.0)
Platelets: 102 10*3/uL — ABNORMAL LOW (ref 150–400)
RBC: 1.24 MIL/uL — ABNORMAL LOW (ref 3.87–5.11)
RDW: 15.1 % (ref 11.5–15.5)
WBC: 2.1 10*3/uL — ABNORMAL LOW (ref 4.0–10.5)
nRBC: 0 % (ref 0.0–0.2)

## 2019-01-21 LAB — VITAMIN B12: Vitamin B-12: 1027 pg/mL — ABNORMAL HIGH (ref 180–914)

## 2019-01-21 LAB — IRON AND TIBC
Iron: 84 ug/dL (ref 28–170)
Saturation Ratios: 26 % (ref 10.4–31.8)
TIBC: 323 ug/dL (ref 250–450)
UIBC: 239 ug/dL

## 2019-01-21 LAB — RETICULOCYTES
Immature Retic Fract: 11.4 % (ref 2.3–15.9)
RBC.: 1.35 MIL/uL — ABNORMAL LOW (ref 3.87–5.11)
Retic Count, Absolute: 66 10*3/uL (ref 19.0–186.0)
Retic Ct Pct: 4.9 % — ABNORMAL HIGH (ref 0.4–3.1)

## 2019-01-21 LAB — NOVEL CORONAVIRUS, NAA (HOSP ORDER, SEND-OUT TO REF LAB; TAT 18-24 HRS): SARS-CoV-2, NAA: NOT DETECTED

## 2019-01-21 LAB — T4, FREE: Free T4: 1.09 ng/dL (ref 0.61–1.12)

## 2019-01-21 LAB — FERRITIN: Ferritin: 1093 ng/mL — ABNORMAL HIGH (ref 11–307)

## 2019-01-21 LAB — TSH: TSH: 2.11 u[IU]/mL (ref 0.350–4.500)

## 2019-01-21 LAB — FOLATE: Folate: 87.3 ng/mL (ref >5.9)

## 2019-01-21 SURGERY — SIGMOIDOSCOPY, FLEXIBLE

## 2019-01-21 MED ORDER — DEXTROSE 50 % IV SOLN
INTRAVENOUS | Status: AC
  Administered 2019-01-21: 50 mL
  Filled 2019-01-21: qty 50

## 2019-01-21 MED ORDER — PANTOPRAZOLE SODIUM 40 MG PO TBEC
40.0000 mg | DELAYED_RELEASE_TABLET | Freq: Two times a day (BID) | ORAL | Status: DC
  Administered 2019-01-21 – 2019-01-26 (×10): 40 mg via ORAL
  Filled 2019-01-21 (×11): qty 1

## 2019-01-21 MED ORDER — SODIUM CHLORIDE 0.9 % IV SOLN: INTRAVENOUS | Status: DC

## 2019-01-21 MED ORDER — INSULIN ASPART 100 UNIT/ML ~~LOC~~ SOLN
1.0000 [IU] | Freq: Three times a day (TID) | SUBCUTANEOUS | Status: DC
  Administered 2019-01-24: 1 [IU] via SUBCUTANEOUS

## 2019-01-21 MED ORDER — SODIUM CHLORIDE 0.9 % IV SOLN
50.0000 mg | Freq: Once | INTRAVENOUS | Status: AC
  Administered 2019-01-21: 50 mg via INTRAVENOUS
  Filled 2019-01-21: qty 1

## 2019-01-21 SURGICAL SUPPLY — 1 items: TOWEL COTTON PACK 4EA (MISCELLANEOUS) ×6 IMPLANT

## 2019-01-21 NOTE — Interval H&P Note (Signed)
History and Physical Interval Note:  01/21/2019 9:17 AM  [name]  has presented today for surgery, with the diagnosis of Rectal bleeding and chronic anemia.  The various methods of treatment have been discussed with the patient. After consideration of risks, benefits and other options for treatment, the patient has consented to  Procedure(s) with comments: FLEXIBLE SIGMOIDOSCOPY (N/A) - Plan to do the test unprepped, unsedated GIVENS CAPSULE STUDY (N/A) as a surgical intervention.  The patient's history has been reviewed, patient examined, no change in status, stable for surgery.  I have reviewed the patient's chart and labs.  Questions were answered to the patient's satisfaction.     Youlanda Mighty Gurkaran Rahm

## 2019-01-21 NOTE — Progress Notes (Signed)
Inpatient Diabetes Program Recommendations  AACE/ADA: New Consensus Statement on Inpatient Glycemic Control (2015)  Target Ranges:  Prepandial:   less than 140 mg/dL      Peak postprandial:   less than 180 mg/dL (1-2 hours)      Critically ill patients:  140 - 180 mg/dL   Results for [name], [name] (MRN [id number]) as of 01/21/2019 11:45  Ref. Range 01/20/2019 17:33 01/20/2019 21:53 01/21/2019 05:11 01/21/2019 06:04 01/21/2019 08:17 01/21/2019 08:54 01/21/2019 09:56  Glucose-Capillary Latest Ref Range: 70 - 99 mg/dL 88 216 (H) 44 (LL) 102 (H) 80 72 77   Review of Glycemic Control  Diabetes history: DM 2 Outpatient Diabetes medications: Lantus 20 units, Novolog 0-9 units tid Current orders for Inpatient glycemic control: Lantus 20 units, Novolog 0-9 units tid  A1c 5.9% on 4/14, however, Hgb was 5.9 at that time.   Inpatient Diabetes Program Recommendations:    Hypoglycemia this am after home dose of Lantus. Please consider decreasing Lantus to 10 units.  Thanks,  Tama Headings RN, MSN, BC-ADM Inpatient Diabetes Coordinator Team Pager [phone] (8a-5p)

## 2019-01-21 NOTE — TOC Initial Note (Addendum)
Transition of Care [facility] - Initial/Assessment Note    Patient Details  Name: [name] MRN: [id number] Date of Birth: [birth year]  Transition of Care [facility]) CM/SW Contact:    Sharin Mons, RN Phone Number: 01/21/2019, 2:34 PM  Clinical Narrative:  Admitted with rectal bleeding, hx of ESRD on HD TTS, Jehovah's Witness, combined CHF, DM-2, hepatitis C, PVD status post left TMA, anemia of GI bleed, anxiety/depression/bipolar.Resides alone in apartment. States recently d/c from SNF,12/25/2018 ( [facility]). States independent with ADL's PTA.  Beau Vanduzer (Son)     [phone]      NCM following for TOC needs....  Consult received per NCM : Patient is in need of a walker and new primary care provider.  DME walker will be provided to pt prior to d/c. Pt agreeable to being followed @ [facility]. Post hospital follow up scheduled @ [facility] and noted on AVS.  Expected Discharge Plan: [place] Barriers to Discharge: Continued Medical Work up   Patient Goals and CMS Choice Patient states their goals for this hospitalization and ongoing recovery are:: My goal is to not come here! CMS Medicare.gov Compare Post Acute Care list provided to:: Patient Choice offered to / list presented to : Patient  Expected Discharge Plan and Services Expected Discharge Plan: [place]       Living arrangements for the past 2 months: Apartment                                      Prior Living Arrangements/Services Living arrangements for the past 2 months: Apartment Lives with:: Self Patient language and need for interpreter reviewed:: Yes Do you feel safe going back to the place where you live?: Yes      Need for Family Participation in Patient Care: No (Comment) Care giver support system in place?: Yes (comment) Current home services: Other (comment)(Pt states active with the [employer]  program) Criminal Activity/Legal Involvement Pertinent to Current  Situation/Hospitalization: No - Comment as needed  Activities of Daily Living      Permission Sought/Granted   Permission granted to share information with : Yes, Verbal Permission Granted  Share Information with NAME: Darcelle Herrada     Permission granted to share info w Relationship: SON  Permission granted to share info w Contact Information: [phone]  Emotional Assessment   Attitude/Demeanor/Rapport: Engaged Affect (typically observed): Accepting Orientation: : Oriented to Self, Oriented to Place, Oriented to  Time, Oriented to Situation Alcohol / Substance Use: Tobacco Use, Alcohol Use Psych Involvement: No (comment), pt with hx of anxiety/depression/bipolar disorder, no psych MD.  Admission diagnosis:  Hyperkalemia [E87.5] Acute GI bleeding [K92.2] Anemia, unspecified type [D64.9] Patient Active Problem List   Diagnosis Date Noted  . Melena   . Rectal bleed 12/30/2018  . Gastritis and gastroduodenitis   . Benign neoplasm of ascending colon   . Benign neoplasm of transverse colon   . Benign neoplasm of descending colon   . GI bleeding 11/17/2018  . Acute blood loss anemia   . No blood products   . GI bleed 10/05/2018  . Prolonged QT interval 10/05/2018  . Localized edema due to fluid overload 09/05/2017  . Lower GI bleed   . Evaluation by psychiatric service required 07/17/2017  . Volume overload 06/03/2017  . Right supracondylar humerus fracture, with nonunion, subsequent encounter 05/01/2017  . Malnutrition of moderate degree 02/28/2017  .  Leg pain, left 02/25/2017  . Left leg pain 02/25/2017  . External hemorrhoids   . Diverticulosis of colon without hemorrhage   . Goals of care, counseling/discussion   . Palliative care encounter   . Cellulitis of left leg 01/16/2017  . Upper GI bleed 01/16/2017  . UGIB (upper gastrointestinal bleed)   . Anemia due to chronic kidney disease   . Cellulitis of left thigh 01/01/2017  . ESRD on dialysis ([place]) 01/01/2017   . Cocaine use disorder, severe, dependence ([place]) 08/29/2016  . Hepatitis C 08/29/2016  . Diabetic ulcer of calf ([place]) 08/29/2016  . Diabetic ulcer of toe ([place]) 08/29/2016  . Cerebrovascular accident (CVA) ([place]) 08/29/2016  . MDD (major depressive disorder) 08/28/2016  . Dyslipidemia associated with type 2 diabetes mellitus ([place]) 11/10/2015  . Hyperkalemia 09/17/2015  . History of vitamin D deficiency 06/20/2015  . Long term prescription opiate use 06/20/2015  . Chronic pain syndrome 06/19/2015  . Anemia of chronic kidney failure 05/01/2015  . transmetatarsal amputation of the left foot 12/13/2014  . Unilateral visual loss 11/22/2014  . 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 ([place]) 11/22/2014  . Essential hypertension 11/09/2014  . Dizziness   . Chronic combined systolic (congestive) and diastolic (congestive) heart failure ([place])   . Rectal bleeding 04/23/2014  . Anemia, iron deficiency 04/13/2014  . Noncompliance 02/02/2014  . Hyponatremia 05/19/2013  . Seizure disorder ([place]) 02/09/2013   PCP:  Patient, [name] Pharmacy:   [facility] [place] - [address] [place] [address] Phone: [phone] Fax: [phone]     Social Determinants of Health (SDOH) Interventions    Readmission Risk Interventions Readmission Risk Prevention Plan 01/21/2019 11/17/2018  Transportation Screening Complete -  Medication Review Press photographer) Complete -  PCP or Specialist appointment within 3-5 days of discharge Complete -  [place] or Home Care Consult Complete -  SW Recovery Care/Counseling Consult - Complete  Palliative Care Screening Not Applicable -  [place] Not Applicable -  Some recent data might be hidden

## 2019-01-21 NOTE — Plan of Care (Signed)

## 2019-01-21 NOTE — Progress Notes (Signed)
Hemoglobin down to 4.2 this am.  Patient without complaints except generalized weakness.  No bleeding seen during my 7p-7a shift.  V.S. 113/56-73.  No dyspnea or chest pain and on room air.  [facility] paged to alert of drop in hemoglobin.  Endo lab coming to get patient at 0815 for 0915 case and aware of low hemoglobin.  Nurse in cath lab is alerting MD that is doing the colonoscopy.  Continue to monitor.

## 2019-01-21 NOTE — Op Note (Signed)
[facility] Patient Name: [name] Procedure Date : 01/21/2019 MRN: [id number] Attending MD: Ronald Lobo , MD Date of Birth: 1965-08-15 CSN: [id number] Age: 53 Admit Type: Inpatient Procedure:                Flexible Sigmoidoscopy Indications:              Hematochezia, acute-on-chronic anemia in a                            Jehovah's Witness dialysis patient with recurring                            anemia Providers:                Ronald Lobo, MD, Jeanella Cara, RN,                            Ladona Ridgel, Technician Referring MD:              Medicines:                None Complications:            No immediate complications. Estimated Blood Loss:     Estimated blood loss: none. Procedure:                Pre-Anesthesia Assessment:                           - Prior to the procedure, a History and Physical                            was performed, and patient medications and                            allergies were reviewed. The patient's tolerance of                            previous anesthesia was also reviewed. The risks                            and benefits of the procedure and the sedation                            options and risks were discussed with the patient.                            All questions were answered, and informed consent                            was obtained. Prior Anticoagulants: The patient has                            taken no previous anticoagulant or antiplatelet                            agents. ASA Grade Assessment: III - A patient with  severe systemic disease. After reviewing the risks                            and benefits, the patient was deemed in                            satisfactory condition to undergo the procedure.                           After obtaining informed consent, the scope was                            passed under direct vision. The CF-HQ190L ([id number])                          Olympus colonoscope was introduced through the anus                            and advanced to the the mid-sigmoid colon. The                            flexible sigmoidoscopy was accomplished without                            difficulty. The patient tolerated the procedure                            well. The quality of the bowel preparation was                            adequate. Scope In: 9:23:45 AM Scope Out: 9:26:24 AM Total Procedure Duration: 0 hours 2 minutes 39 seconds  Findings:      Skin tags were found on perianal exam.      A moderate amount of dark, solid stool was found in the rectum and in       the sigmoid colon. There appeared to be some minimal burgundy tinging of       the mucosa and the stool, but no fresh blood, no clots, no frankly       bloody stool.      The exam was otherwise without abnormality.      There is no endoscopic evidence of bleeding, diverticula, inflammation,       stercoral ulceration, proctitis mass or polyps in the rectum and in the       sigmoid colon up to the limit of the exam, approximately 28 cm.      The retroflexed view of the distal rectum and anal verge was normal and       showed no anal or rectal abnormalities. No significant internal       hemorrhoids were seen. Impression:               - No active bleeding at time of procedure.                           - No bleeding source identified on this exam.                           -  Perianal skin tags found on perianal exam, but no                            significant hemorrhoids noted.                           - Dark stool in the rectum and in the sigmoid                            colon, minimally burgundy-tinged stool and mucosa,                            but no frank blood.                           - The examination was otherwise normal.                           - No specimens collected. Recommendation:           - To visualize the small bowel, perform  video                            capsule endoscopy today. Procedure Code(s):        --- Professional ---                           [phone], Sigmoidoscopy, flexible; diagnostic,                            including collection of specimen(s) by brushing or                            washing, when performed (separate procedure) Diagnosis Code(s):        --- Professional ---                           K92.1, Melena (includes Hematochezia) CPT copyright 2019 American Medical Association. All rights reserved. The codes documented in this report are preliminary and upon coder review may  be revised to meet current compliance requirements. Ronald Lobo, MD 01/21/2019 9:52:23 AM This report has been signed electronically. Number of Addenda: 0

## 2019-01-21 NOTE — Plan of Care (Signed)
  Problem: Elimination: Goal: Will not experience complications related to urinary retention Outcome: Not Progressing  Patient oliguric, on dialysis.  Problem: Pain Managment: Goal: General experience of comfort will improve Outcome: Not Progressing  C/o intermittent pain in BLE. Medication provided as ordered.

## 2019-01-21 NOTE — Progress Notes (Signed)
Refusing to wear SCD's.

## 2019-01-21 NOTE — Progress Notes (Signed)
Patient hemoglobin is 4.5 and refusing blood but is requesting for the doctor to order iron to be infused.  Notified Jeannette Corpus, NP.  Will continue to monitor the patient and notify as needed.

## 2019-01-21 NOTE — Progress Notes (Signed)
Renal Navigator notified OP HD clinic/[facility] of patient's admission and negative COVID 19 test result in order to provide continuity of care and safety.  Alphonzo Cruise, [place] Renal Navigator [phone]

## 2019-01-21 NOTE — Progress Notes (Signed)
Patient sigmoidoscopy was well-tolerated.  Please see separate report.  No frank blood or obvious bleeding site identified.  Will proceed to video capsule endoscopy of the small bowel today, as planned.  Despite the patient's low hemoglobin this morning of 4.2, she appears comfortable, is very chipper and coherent, and is not short of breath or experiencing chest pain.  Cleotis Nipper, M.D. Pager [phone] If no answer or after 5 PM call [phone]

## 2019-01-21 NOTE — Progress Notes (Signed)
[facility] Progress Note   Subjective:  Had sigmoidoscopy today - no active bleed found.  Hgb down 4.2. Appears comfortable. No bloody BMs so far today. Denies CP/SOB at rest. Feels dizzy if tries to stand.  Refusing transfusion today "I want to be loyal to Jehovah"   Objective Vitals:   01/21/19 0850 01/21/19 0918 01/21/19 0923 01/21/19 0928  BP: (!) 147/65 (!) 116/39 (!) 126/43 (!) 129/50  Pulse: 84 80 81 80  Resp: 20 13 (!) 23 (!) 26  Temp: 97.9 F (36.6 C)     TempSrc: Oral     SpO2: 100% 99% 100% 100%  Weight:      Height:        Physical Exam General: WNWD female  NAD  Heart: RRR  Lungs: CTAB  Abdomen: soft NTND Extremities: No LE edema  Dialysis Access: LUE AVG +bruit    Weight change:    Additional Objective Labs: Basic Metabolic Panel: Recent Labs  Lab 01/20/19 0741 01/20/19 0748 01/21/19 0413  NA 134* 129* 136  K 6.4* 6.2* 4.9  CL 92* 96* 95*  CO2 24  --  30  GLUCOSE 177* 168* 60*  BUN 116* 126* 45*  CREATININE 8.54* 9.30* 4.42*  CALCIUM 9.3  --  8.1*   CBC: Recent Labs  Lab 01/20/19 0741 01/20/19 0748 01/21/19 0413  WBC 3.2*  --  2.1*  NEUTROABS 1.9  --   --   HGB 5.6* 6.1* 4.2*  HCT 17.9* 18.0* 13.3*  MCV 109.8*  --  107.3*  PLT 124*  --  102*   Blood Culture    Component Value Date/Time   SDES BLOOD RIGHT UPPER ARM 02/25/2017 1927   SPECREQUEST  02/25/2017 1927    BOTTLES DRAWN AEROBIC AND ANAEROBIC Blood Culture adequate volume   CULT NO GROWTH 5 DAYS 02/25/2017 1927   REPTSTATUS 03/02/2017 FINAL 02/25/2017 1927     Medications:  . allopurinol  200 mg Oral Daily  . atorvastatin  40 mg Oral QHS  . calcitRIOL  1 mcg Oral Q T,Th,Sat-1800  . Chlorhexidine Gluconate Cloth  6 each Topical Q0600  . darbepoetin (ARANESP) injection - DIALYSIS  200 mcg Intravenous Q Wed-HD  . DULoxetine  60 mg Oral Daily   And  . DULoxetine  30 mg Oral Daily  . ferric citrate  420 mg Oral TID WC  . folic acid  1 mg Oral Daily   . insulin aspart  0-9 Units Subcutaneous TID WC  . insulin glargine  20 Units Subcutaneous QHS  . labetalol  200 mg Oral BID  . multivitamin  1 tablet Oral QHS  . pantoprazole  40 mg Oral Q0600  . polyethylene glycol  17 g Oral Once  . saccharomyces boulardii  250 mg Oral Daily  . senna  2 tablet Oral Daily  . sodium chloride flush  3 mL Intravenous Q12H    GOC TTS 3.5h 400/A 2.0 EDW 72.5kg 2K/2Ca Profile 4 L AVG  No heparin  --Calcitriol 2.0 PO TIW  -- Venofer 50 mg q week  -- Mircera 225 mcg IV q 2 weeks (last dosed 12/03/18)  Recent OP Labs: 5/21: Hgb 10.1 Tsat 46% Ferritin 1067 Ca 10.2 Alb 4.0   iPTH  294  Assessment/Plan: 1. Recurrent GI bleed/symptomatic anemia --Hgb 6.1>4.2. Patient is Jehovah's Witness and does not want to receive blood products today. GI following. Flexible sigmoidoscopy today with no active bleed found. Capsule study in progress.  2. Hyperkalemia - Resolved with  HD. .  3.  ESRD -  Usually TTS. Had HD 6/17 off schedule. No urgent indications for HD today so will hold and CTM. Unsafe for HD with Hgb <6.  4.  Hypertension/volume  - Hypertensive on admission. Improved with HD. Net UF 2.5L on 6/17 Post HD wt 73.1kg .  5.  Anemia  - As above.  Aranesp 200 dosed with HD 6/17.  6.  Metabolic bone disease -  Continue VDRA/binders as able  7.  Nutrition - Renal diet/vitamins as able  DM - per primary    Lynnda Child PA-C [facility] Pager [phone] 01/21/2019,11:02 AM  LOS: 1 day

## 2019-01-21 NOTE — Progress Notes (Signed)
Blood sugar check 44 after patient c/o weakness. 1/2 amp D50 given per protocol, CBG now 102.  MD on call text paged with events.  Continue to monitor progress and CBG check in an hour.  Pt is feeling much better.

## 2019-01-21 NOTE — Progress Notes (Signed)
Patient ingested givens capsule for small bowel endoscopy 01/21/2019 at [place]. Patient to remain NPO for 2 full hours following ingestion of capsule. Verbal and written instructions given to patient and primary RN. Patient is to wear equipment for 12 full hours. Endoscopy staff will collect equipment 01/22/2019 AM.

## 2019-01-21 NOTE — Progress Notes (Addendum)
Call placed to son Darcus Austin to update on condition. No answer at this time.   1937: Call to on call to see if diet order should be changed to renal. Awaiting C/B

## 2019-01-21 NOTE — Progress Notes (Signed)
All AM meds held per post procedure orders

## 2019-01-21 NOTE — Progress Notes (Signed)
Patient took off telemetry, refuses to let staff put back on.  States that the patches irritate her skin.  Benadryl lotion and po benadryl given for itching.  [facility] on call notified with no additional orders at this time.  To monitor closely and encourage patient to put telemetry back on.

## 2019-01-21 NOTE — Progress Notes (Signed)
PROGRESS NOTE  [name] HEN:[id number] DOB: 07-16-1966 DOA: 01/20/2019 PCP: Patient, [name]   LOS: 1 day   Patient is from: Home  Brief Narrative / Interim history: 53-year-old female with history of ESRD on HD TTS, Jehovah's Witness, combined CHF, DM-2, hepatitis C, PVD status post left TMA, anemia of GI bleed, anxiety/depression/bipolar disorder admitted with symptomatic anemia likely due to hematochezia and melena.   Patient was previously admitted with rectal bleeding and received blood transfusion but regretting this.  On admission, hemodynamically stable.  Hemoglobin 5.6 and patient declined transfusion.  Potassium 6.4 received Lokelma.  BUN 116.  Creatinine 8.57.  Nephrology and GI consulted, and patient admitted for further care.    Had HD on 6/17.  Hyperkalemia and azotemia resolved.    On 6/18, hemoglobin dropped further to 4.2.  She continues to decline blood transfusion.  She had flex sigmoidoscopy that showed dark stool in the rectum and in the sigmoid colon, minimally burgundy-tinged stool and mucosa, but no frank blood or obvious source of bleeding.  Undergoing capsule endoscopy.   Subjective: Reports dyspnea with exertion and palpitation.  Denies chest pain or abdominal pain.  Again she is states that she does not want blood transfusion.   Assessment & Plan: Symptomatic acute blood loss anemia due to GI bleed (hematochezia and melena) with underlying anemia of chronic disease: Hemoglobin 5.6 on admission and dropped further to 4.2.  Hemodynamically stable. -Patient continues to refuse blood transfusion -Flex sigmoidoscopy on 6/18 as above -Capsule endoscopy on 6/18 -Continue clear liquid diet -Monitor H&H every 6 hours -Increase p.o. Protonix to twice daily -Anemia panel consistent with anemia of chronic disease. -Aranesp and ferric citrate per nephrology -Move to stepdown unit for close monitoring.  Pancytopenia: suspect this to be due to nutritional  deficiency from ongoing GI bleed -Discussed with Dr. Maylon Peppers, oncology over the phone who suggested checking TFT and copper level. -Continue monitoring  ESRD on HD TTS/BMD/azotemia/hyperkalemia: Missed dialysis on 6/16.  Had HD on 6/17 -Hyperkalemia and azotemia resolved. -Further care per nephrology  Well-controlled IDDM-2 with renal complication and hypoglycemia: A1c 5.9%.  Hypoglycemic to 44 early this morning. -CBG monitoring -Customized SSI to 0-5 units  Essential hypertension: Normotensive -Continue home labetalol -Home amlodipine on hold.  Chronic pain: Stable -Continue home Cymbalta and PRN oxycodone.  Anxiety/depression/bipolar disorder: Stable -Continue home meds (Xanax, Cymbalta and trazodone).  Prolonged QT: QTC noted to be prolonged to 495 -Closely monitor electrolytes including magnesium  Noncompliance: Patient has been refusing to wear SCD and cardiac monitor -Discussed the importance of this especially with her very low hemoglobin  Scheduled Meds: . allopurinol  200 mg Oral Daily  . atorvastatin  40 mg Oral QHS  . calcitRIOL  1 mcg Oral Q T,Th,Sat-1800  . Chlorhexidine Gluconate Cloth  6 each Topical Q0600  . darbepoetin (ARANESP) injection - DIALYSIS  200 mcg Intravenous Q Wed-HD  . DULoxetine  60 mg Oral Daily   And  . DULoxetine  30 mg Oral Daily  . ferric citrate  420 mg Oral TID WC  . folic acid  1 mg Oral Daily  . insulin aspart  0-9 Units Subcutaneous TID WC  . insulin glargine  20 Units Subcutaneous QHS  . labetalol  200 mg Oral BID  . multivitamin  1 tablet Oral QHS  . pantoprazole  40 mg Oral Q0600  . polyethylene glycol  17 g Oral Once  . saccharomyces boulardii  250 mg Oral Daily  . senna  2 tablet Oral Daily  . sodium chloride flush  3 mL Intravenous Q12H   Continuous Infusions: PRN Meds:.acetaminophen **OR** acetaminophen, albuterol, ALPRAZolam, diphenhydrAMINE, diphenhydrAMINE-zinc acetate, mupirocin ointment, oxyCODONE   DVT  prophylaxis: SCD Code Status: Full code Family Communication: Patient to update family and let me know if questions Disposition Plan: We will escalate care to SDU for close monitoring  Consultants:   Nephrology  GI  Oncology over the phone  Procedures:   Flex sigmoidoscopy on 6/18  Capsule endoscopy on 6/18  Microbiology: . COVID-19 negative  Antimicrobials: Anti-infectives (From admission, onward)   None       Objective: Vitals:   01/21/19 0850 01/21/19 0918 01/21/19 0923 01/21/19 0928  BP: (!) 147/65 (!) 116/39 (!) 126/43 (!) 129/50  Pulse: 84 80 81 80  Resp: 20 13 (!) 23 (!) 26  Temp: 97.9 F (36.6 C)     TempSrc: Oral     SpO2: 100% 99% 100% 100%  Weight:      Height:        Intake/Output Summary (Last 24 hours) at 01/21/2019 1146 Last data filed at 01/20/2019 1650 Gross per 24 hour  Intake 360 ml  Output 2500 ml  Net -2140 ml   Filed Weights   01/20/19 1050 01/20/19 1145 01/20/19 1529  Weight: 75.7 kg 75.8 kg 73.1 kg    Examination:  GENERAL: No acute distress.  Appears well.  HEENT: MMM.  Vision and hearing grossly intact.  NECK: Supple.  No JVD.  LUNGS:  No IWOB. Good air movement bilaterally. HEART:  RRR. Heart sounds normal.  1-2 to 2/6 SEM over LUSB ABD: Bowel sounds present. Soft. Non tender.  MSK/EXT:  Moves all extremities. No apparent deformity. No edema bilaterally.  SKIN: no apparent skin lesion or wound NEURO: Awake, alert and oriented appropriately.  No gross deficit.  PSYCH: Calm. Normal affect.    Data Reviewed: I have independently reviewed following labs and imaging studies  CBC: Recent Labs  Lab 01/20/19 0741 01/20/19 0748 01/21/19 0413  WBC 3.2*  --  2.1*  NEUTROABS 1.9  --   --   HGB 5.6* 6.1* 4.2*  HCT 17.9* 18.0* 13.3*  MCV 109.8*  --  107.3*  PLT 124*  --  440*   Basic Metabolic Panel: Recent Labs  Lab 01/20/19 0741 01/20/19 0748 01/21/19 0413  NA 134* 129* 136  K 6.4* 6.2* 4.9  CL 92* 96* 95*  CO2  24  --  30  GLUCOSE 177* 168* 60*  BUN 116* 126* 45*  CREATININE 8.54* 9.30* 4.42*  CALCIUM 9.3  --  8.1*   GFR: Estimated Creatinine Clearance: 14.6 mL/min (A) (by C-G formula based on SCr of 4.42 mg/dL (H)). Liver Function Tests: Recent Labs  Lab 01/20/19 0741  AST 47*  ALT 45*  ALKPHOS 133*  BILITOT 0.8  PROT 6.9  ALBUMIN 3.3*   No results for input(s): LIPASE, AMYLASE in the last 168 hours. No results for input(s): AMMONIA in the last 168 hours. Coagulation Profile: No results for input(s): INR, PROTIME in the last 168 hours. Cardiac Enzymes: No results for input(s): CKTOTAL, CKMB, CKMBINDEX, TROPONINI in the last 168 hours. BNP (last 3 results) No results for input(s): PROBNP in the last 8760 hours. HbA1C: No results for input(s): HGBA1C in the last 72 hours. CBG: Recent Labs  Lab 01/21/19 0511 01/21/19 0604 01/21/19 0817 01/21/19 0854 01/21/19 0956  GLUCAP 44* 102* 80 72 77   Lipid Profile: No results for input(s): CHOL,  HDL, LDLCALC, TRIG, CHOLHDL, LDLDIRECT in the last 72 hours. Thyroid Function Tests: No results for input(s): TSH, T4TOTAL, FREET4, T3FREE, THYROIDAB in the last 72 hours. Anemia Panel: Recent Labs    01/21/19 1030  RETICCTPCT 4.9*   Urine analysis:    Component Value Date/Time   COLORURINE YELLOW 02/25/2017 1224   APPEARANCEUR HAZY (A) 02/25/2017 1224   LABSPEC 1.013 02/25/2017 1224   PHURINE 8.0 02/25/2017 1224   GLUCOSEU >=500 (A) 02/25/2017 1224   HGBUR SMALL (A) 02/25/2017 1224   BILIRUBINUR NEGATIVE 02/25/2017 1224   BILIRUBINUR neg 12/13/2014 1127   KETONESUR NEGATIVE 02/25/2017 1224   PROTEINUR >=300 (A) 02/25/2017 1224   UROBILINOGEN 0.2 05/01/2015 1252   NITRITE NEGATIVE 02/25/2017 1224   LEUKOCYTESUR NEGATIVE 02/25/2017 1224   Sepsis Labs: Invalid input(s): PROCALCITONIN, LACTICIDVEN  Recent Results (from the past 240 hour(s))  Novel Coronavirus,NAA,(SEND-OUT TO REF LAB - TAT 24-48 hrs); Hosp Order     Status:  None   Collection Time: 01/20/19  8:04 AM   Specimen: Oropharyngeal swab; Respiratory  Result Value Ref Range Status   SARS-CoV-2, NAA NOT DETECTED NOT DETECTED Final    Comment: (NOTE) This test was developed and its performance characteristics determined by [employer]. This test has not been FDA cleared or approved. This test has been authorized by FDA under an Emergency Use Authorization (EUA). This test is only authorized for the duration of time the declaration that circumstances exist justifying the authorization of the emergency use of in vitro diagnostic tests for detection of SARS-CoV-2 virus and/or diagnosis of COVID-19 infection under section 564(b)(1) of the Act, 21 U.S.C. 697XYI-0(X)(6), unless the authorization is terminated or revoked sooner. When diagnostic testing is negative, the possibility of a false negative result should be considered in the context of a patient's recent exposures and the presence of clinical signs and symptoms consistent with COVID-19. An individual without symptoms of COVID-19 and who is not shedding SARS-CoV-2 virus would expect to have a negative (not detected) result in this assay. Performed  At: [facility] [address] [place], [place] [id number] Rush Farmer MD BE:[id number]    [place]  Final    Comment: Performed at [facility], [place] [address], [place], [place] [address]  MRSA PCR Screening     Status: Abnormal   Collection Time: 01/20/19 11:28 AM   Specimen: Nasal Mucosa; Nasopharyngeal  Result Value Ref Range Status   MRSA by PCR POSITIVE (A) NEGATIVE Final    Comment:        The GeneXpert MRSA Assay (FDA approved for NASAL specimens only), is one component of a comprehensive MRSA colonization surveillance program. It is not intended to diagnose MRSA infection nor to guide or monitor treatment for MRSA infections. RESULT CALLED TO, READ BACK BY AND VERIFIED WITH: Precious Bard RN 13:15 01/20/19 ([email or web address]) Performed at [facility], [place] [address], [place], [place] [address]       Radiology Studies: No results found.   [facility] Pager [phone]  If 7PM-7AM, please contact night-coverage [email or web address] Password [facility] 01/21/2019, 11:46 AM

## 2019-01-22 ENCOUNTER — Inpatient Hospital Stay (HOSPITAL_COMMUNITY): Payer: Medicare Other

## 2019-01-22 LAB — RENAL FUNCTION PANEL
Albumin: 2.8 g/dL — ABNORMAL LOW (ref 3.5–5.0)
Anion gap: 16 — ABNORMAL HIGH (ref 5–15)
BUN: 69 mg/dL — ABNORMAL HIGH (ref 6–20)
CO2: 25 mmol/L (ref 22–32)
Calcium: 8.4 mg/dL — ABNORMAL LOW (ref 8.9–10.3)
Chloride: 93 mmol/L — ABNORMAL LOW (ref 98–111)
Creatinine, Ser: 5.97 mg/dL — ABNORMAL HIGH (ref 0.44–1.00)
GFR calc Af Amer: 9 mL/min — ABNORMAL LOW (ref >60)
GFR calc non Af Amer: 7 mL/min — ABNORMAL LOW (ref >60)
Glucose, Bld: 191 mg/dL — ABNORMAL HIGH (ref 70–99)
Phosphorus: 6.5 mg/dL — ABNORMAL HIGH (ref 2.5–4.6)
Potassium: 5.5 mmol/L — ABNORMAL HIGH (ref 3.5–5.1)
Sodium: 134 mmol/L — ABNORMAL LOW (ref 135–145)

## 2019-01-22 LAB — GLUCOSE, CAPILLARY
Glucose-Capillary: 149 mg/dL — ABNORMAL HIGH (ref 70–99)
Glucose-Capillary: 115 mg/dL — ABNORMAL HIGH (ref 70–99)
Glucose-Capillary: 117 mg/dL — ABNORMAL HIGH (ref 70–99)
Glucose-Capillary: 104 mg/dL — ABNORMAL HIGH (ref 70–99)

## 2019-01-22 LAB — CBC
HCT: 12.6 % — ABNORMAL LOW (ref 36.0–46.0)
Hemoglobin: 4 g/dL — CL (ref 12.0–15.0)
MCH: 34.5 pg — ABNORMAL HIGH (ref 26.0–34.0)
MCHC: 31.7 g/dL (ref 30.0–36.0)
MCV: 108.6 fL — ABNORMAL HIGH (ref 80.0–100.0)
Platelets: 103 10*3/uL — ABNORMAL LOW (ref 150–400)
RBC: 1.16 MIL/uL — ABNORMAL LOW (ref 3.87–5.11)
RDW: 15 % (ref 11.5–15.5)
WBC: 2.5 10*3/uL — ABNORMAL LOW (ref 4.0–10.5)
nRBC: 0 % (ref 0.0–0.2)

## 2019-01-22 LAB — MAGNESIUM: Magnesium: 2.3 mg/dL (ref 1.7–2.4)

## 2019-01-22 LAB — HEMOGLOBIN AND HEMATOCRIT, BLOOD
HCT: 15.9 % — ABNORMAL LOW (ref 36.0–46.0)
Hemoglobin: 5 g/dL — CL (ref 12.0–15.0)

## 2019-01-22 MED ORDER — SODIUM ZIRCONIUM CYCLOSILICATE 10 G PO PACK
10.0000 g | PACK | Freq: Every day | ORAL | Status: AC
  Administered 2019-01-22: 10 g via ORAL
  Filled 2019-01-22: qty 1

## 2019-01-22 MED ORDER — SODIUM CHLORIDE 0.9 % IV SOLN
125.0000 mg | INTRAVENOUS | Status: DC
  Administered 2019-01-22 – 2019-01-23 (×2): 125 mg via INTRAVENOUS
  Filled 2019-01-22 (×3): qty 10

## 2019-01-22 MED ORDER — CHLORHEXIDINE GLUCONATE CLOTH 2 % EX PADS
6.0000 | MEDICATED_PAD | Freq: Every day | CUTANEOUS | Status: DC
  Administered 2019-01-24: 6 via TOPICAL

## 2019-01-22 NOTE — Progress Notes (Signed)
PROGRESS NOTE  [name] WJX:[id number] DOB: 01-May-1966 DOA: 01/20/2019 PCP: Patient, [name]   LOS: 2 days   Brief narrative: Patient is a 53 year old female with history of ESRD on HD TTS, Jehovah's Witness, combined CHF, DM-2, hepatitis C, PVD status post left TMA, anemia of GI bleed, anxiety/depression/bipolar disorder admitted with symptomatic anemia likely due to hematochezia and melena.   Patient was previously admitted with rectal bleeding and received blood transfusion but she regrets accepting blood transfusion at the time  On admission, hemodynamically stable.  Hemoglobin 5.6 and patient declined transfusion.  Potassium 6.4 received Lokelma.  BUN 116.  Creatinine 8.57.  Nephrology and GI consulted, and patient admitted for further care.    Had HD on 6/17.  Hyperkalemia and azotemia resolved.    On 6/18, hemoglobin dropped further to 4.2.  She continues to refuse blood transfusion this time.  She had flex sigmoidoscopy that showed dark stool in the rectum and in the sigmoid colon, minimally burgundy-tinged stool and mucosa, but no frank blood or obvious source of bleeding.  Undergoing capsule endoscopy.   Subjective: Patient was seen and examined this morning.  Lying down in bed.  Feels weak and cold.  Continues to decline blood transfusion.  Assessment/Plan:  Principal Problem:   GI bleed Active Problems:   Essential hypertension   Chronic pain syndrome   Hyperkalemia   ESRD on dialysis (HCC)   Prolonged QT interval   Acute blood loss anemia  Symptomatic acute blood loss anemia due to GI bleed (hematochezia and melena) with underlying anemia of chronic disease: Hemoglobin 5.6 on admission and dropped further to 4.2.  Hemodynamically stable. -Patient continues to decline blood transfusion -Flex sigmoidoscopy on 6/18 as above -Capsule endoscopy on 6/18 -Continue renal diet -Continue Protonix twice daily  -Anemia panel consistent with anemia of chronic  disease. -Aranesp and ferric citrate per nephrology  Pancytopenia: suspect this to be due to nutritional deficiency from ongoing GI bleed -Previous hospitalist discussed this with Dr. Maylon Peppers, oncology over the phone who suggested checking TFT and copper level. -Continue monitoring  ESRD on HD TTS/BMD/azotemia/hyperkalemia: Missed dialysis on 6/16.  Had HD on 6/17 -Hyperkalemia and azotemia resolved. -Further care per nephrology.  Well-controlled IDDM-2 with renal complication and hypoglycemia: A1c 5.9%. -CBG monitoring -Continue customized SSI to 0-5 units  Essential hypertension: Normotensive -Continue home labetalol -Home amlodipine on hold.  Chronic pain: Stable -Continue home Cymbalta and PRN oxycodone.  Anxiety/depression/bipolar disorder: Stable -Continue home meds (Xanax, Cymbalta and trazodone).  Prolonged QT: QTC noted to be prolonged to 495 -Closely monitor electrolytes including magnesium  Noncompliance: Patient has been refusing to wear SCD and cardiac monitor -Discussed the importance of this especially with her very low hemoglobin  Body mass index is 27.66 kg/m. Mobility: Encourage ambulation Diet: Renal diet DVT prophylaxis:  SCD Code Status:   Code Status: Full Code  Family Communication:  Expected Discharge:  Continue inpatient management for now  Consultants:   Nephrology  GI  Oncology over the phone  Procedures:   Flex sigmoidoscopy on 6/18  Capsule endoscopy on 6/18  Microbiology:  COVID-19 negative  Antimicrobials:  Anti-infectives (From admission, onward)   None      Infusions:  . ferric gluconate (FERRLECIT/NULECIT) IV 125 mg (01/22/19 1154)    Scheduled Meds: . allopurinol  200 mg Oral Daily  . atorvastatin  40 mg Oral QHS  . calcitRIOL  1 mcg Oral Q T,Th,Sat-1800  . Chlorhexidine Gluconate Cloth  6 each Topical Q0600  .  Chlorhexidine Gluconate Cloth  6 each Topical Q0600  . darbepoetin (ARANESP) injection -  DIALYSIS  200 mcg Intravenous Q Wed-HD  . DULoxetine  60 mg Oral Daily   And  . DULoxetine  30 mg Oral Daily  . ferric citrate  420 mg Oral TID WC  . folic acid  1 mg Oral Daily  . insulin aspart  1-5 Units Subcutaneous TID WC  . insulin glargine  20 Units Subcutaneous QHS  . labetalol  200 mg Oral BID  . multivitamin  1 tablet Oral QHS  . pantoprazole  40 mg Oral BID  . saccharomyces boulardii  250 mg Oral Daily  . senna  2 tablet Oral Daily  . sodium chloride flush  3 mL Intravenous Q12H    PRN meds: acetaminophen **OR** acetaminophen, albuterol, ALPRAZolam, diphenhydrAMINE, diphenhydrAMINE-zinc acetate, mupirocin ointment, oxyCODONE   Objective: Vitals:   01/22/19 0531 01/22/19 1216  BP: 115/62 (!) 119/40  Pulse: 72 72  Resp: 18 16  Temp: 98.2 F (36.8 C) 98.9 F (37.2 C)  SpO2: 98% 100%    Intake/Output Summary (Last 24 hours) at 01/22/2019 1457 Last data filed at 01/22/2019 0530 Gross per 24 hour  Intake -  Output 4 ml  Net -4 ml   Filed Weights   01/20/19 1050 01/20/19 1145 01/20/19 1529  Weight: 75.7 kg 75.8 kg 73.1 kg   Weight change:  Body mass index is 27.66 kg/m.   Physical Exam: General exam: Looks tired, covered under blanket  skin: No rashes, lesions or ulcers. HEENT: Atraumatic, normocephalic, supple neck, no obvious bleeding Lungs: Clear to auscultation bilaterally CVS: Regular rate and rhythm, no murmur GI/Abd soft, nontender, nondistended, bowel sound present CNS: Alert, awake, oriented x3 Psychiatry: Mood appropriate Extremities: No pedal edema, no calf tenderness  Data Review: I have personally reviewed the laboratory data and studies available.   Terrilee Croak, MD  [facility] 01/22/2019

## 2019-01-22 NOTE — Care Management Important Message (Signed)
Important Message  Patient Details  Name: [name] MRN: [id number] Date of Birth: 10-May-1966   Medicare Important Message Given:        Orbie Pyo 01/22/2019, 11:41 AM

## 2019-01-22 NOTE — Progress Notes (Signed)
Patient is refusing cardiac monitoring and bed alarm.  Educated the patient on the importance of both, patient continued to refuse.  Notified NP on call.  Will continue to monitor the patient and notify as needed

## 2019-01-22 NOTE — Progress Notes (Signed)
VAST received consult to place PIV. VAST RN called unit and spoke with pt's nurse regarding need for IV. Pt is not currently receiving IV fluids or meds outside of dialysis. Unit RN stated physician is ordering IV Iron for tomorrow. Educated regarding vein preservation and decreasing risk for infection by placing IV's when they are needed. Unit RN verbalized understanding. VAST RN encouraged VAST consult if circumstances change.

## 2019-01-22 NOTE — Progress Notes (Addendum)
[facility] Progress Note   Subjective:   Had capsule endoscopy - in progress Hgb down 4.0. No CP/SOB at rest. Feels dizzy if tries to stand. Weak/cold. Sometimes has ringing in her ears Still declines transfusion.   Objective Vitals:   01/21/19 0928 01/21/19 1338 01/21/19 2147 01/22/19 0531  BP: (!) 129/50 135/64 (!) 142/65 115/62  Pulse: 80 82 87 72  Resp: (!) 26 18 18 18   Temp:  98.2 F (36.8 C) 97.8 F (36.6 C) 98.2 F (36.8 C)  TempSrc:  Oral  Oral  SpO2: 100% 99% 99% 98%  Weight:      Height:        Physical Exam General: WNWD female  NAD supine in bed Heart: RRR  Lungs: CTAB  Abdomen: soft NTND Extremities: No LE edema  Dialysis Access: LUE AVG +bruit     Weight change:    Additional Objective Labs: Basic Metabolic Panel: Recent Labs  Lab 01/20/19 0741 01/20/19 0748 01/21/19 0413 01/22/19 0342  NA 134* 129* 136 134*  K 6.4* 6.2* 4.9 5.5*  CL 92* 96* 95* 93*  CO2 24  --  30 25  GLUCOSE 177* 168* 60* 191*  BUN 116* 126* 45* 69*  CREATININE 8.54* 9.30* 4.42* 5.97*  CALCIUM 9.3  --  8.1* 8.4*  PHOS  --   --   --  6.5*   CBC: Recent Labs  Lab 01/20/19 0741  01/21/19 0413  01/21/19 1836 01/21/19 2313 01/22/19 0342  WBC 3.2*  --  2.1*  --   --   --  2.5*  NEUTROABS 1.9  --   --   --   --   --   --   HGB 5.6*   < > 4.2*   < > 4.5* 5.0* 4.0*  HCT 17.9*   < > 13.3*   < > 14.1* 15.9* 12.6*  MCV 109.8*  --  107.3*  --   --   --  108.6*  PLT 124*  --  102*  --   --   --  103*   < > = values in this interval not displayed.     Medications: . ferric gluconate (FERRLECIT/NULECIT) IV     . allopurinol  200 mg Oral Daily  . atorvastatin  40 mg Oral QHS  . calcitRIOL  1 mcg Oral Q T,Th,Sat-1800  . Chlorhexidine Gluconate Cloth  6 each Topical Q0600  . darbepoetin (ARANESP) injection - DIALYSIS  200 mcg Intravenous Q Wed-HD  . DULoxetine  60 mg Oral Daily   And  . DULoxetine  30 mg Oral Daily  . ferric citrate  420 mg Oral TID WC   . folic acid  1 mg Oral Daily  . insulin aspart  1-5 Units Subcutaneous TID WC  . insulin glargine  20 Units Subcutaneous QHS  . labetalol  200 mg Oral BID  . multivitamin  1 tablet Oral QHS  . pantoprazole  40 mg Oral BID  . saccharomyces boulardii  250 mg Oral Daily  . senna  2 tablet Oral Daily  . sodium chloride flush  3 mL Intravenous Q12H    GOC TTS 3.5h 400/A 2.0 EDW 72.5kg 2K/2Ca Profile 4 L AVG  No heparin  --Calcitriol 2.0 PO TIW  -- Venofer 50 mg q week  -- Mircera 225 mcg IV q 2 weeks (last dosed 12/03/18)  Recent OP Labs: 5/21: Hgb 10.1 Tsat 46% Ferritin 1067 Ca 10.2 Alb 4.0   iPTH  294  Assessment/Plan: 1. Recurrent GI bleed/symptomatic anemia --Hgb 6.1>4.2. Patient is Jehovah's Witness and does not want to receive blood products today. GI following. Flexible sigmoidoscopy today with no active bleed found. Capsule study in progress.  2. Hyperkalemia -trending back up - holding HD today - plan 10 lokelma 3.  ESRD -  Usually TTS. Had HD 6/17 off schedule.  Unsafe for HD with Hgb <6 but will try with shorter time, lower BFR to stress the system less tomorrow. 4. Hypertension/volume  - Hypertensive on admission. Improved with HD. Net UF 2.5L on 6/17 Post HD wt 73.1kg . Avoid low BP with low hgb  5. Anemia  - As above.  Aranesp 200 dosed with HD 6/17. Added supplemental Fe today- need to use pediatric tubes to minimize blood loss. 6.  Metabolic bone disease -  Continue VDRA/Fe based binders  7.  Nutrition - Renal diet/vitamins as able - encouraged eating  8.    DM - per primary   9.    Thrombocytopenia - no heparin - follow 10.  Anxiety/depression/bipolar d/o  Amalia Hailey, PA-C [facility] Pager [phone] 01/22/2019,11:09 AM  LOS: 2 days    I have personally seen and examined this patient and agree with the assessment/plan as outlined.  Lokelma to temporize K today and then use modified parameters tomorrow (lower BFR/ DFR), F160 dialyzer.  Also will  need supplemental O2 during rx. Herma Mering 01/22/2019 8:59 PM

## 2019-01-22 NOTE — Progress Notes (Signed)
Patient's capsule endoscopy was negative for active bleeding, blood in the small intestinal or colonic lumen, or any prospective source of bleeding.  Meanwhile, the patient's hemoglobin has been holding basically steady over the past 24 hours, and although she feels tired, she is not in any acute distress.    She is mentating clearly and in no respiratory distress.  Plan: I will order a CT of the abdomen and pelvis (without IV contrast), last done 4 years ago, to look for any structural cause of recurrent GI blood loss.  An example of this would be a small bowel leiomyoma, which might possibly be missed on capsule endoscopy since it is primarily extraluminal in character.  I have discussed this test with the patient, and she is agreeable.  Cleotis Nipper, M.D. Pager [phone] If no answer or after 5 PM call [phone]

## 2019-01-22 NOTE — Progress Notes (Signed)
Consult placed to IV team. Patient needs IV. States to re-consult tomorrow when medication needed as IVs are placed when needed.

## 2019-01-23 LAB — BASIC METABOLIC PANEL
Anion gap: 18 — ABNORMAL HIGH (ref 5–15)
BUN: 82 mg/dL — ABNORMAL HIGH (ref 6–20)
CO2: 25 mmol/L (ref 22–32)
Calcium: 9 mg/dL (ref 8.9–10.3)
Chloride: 91 mmol/L — ABNORMAL LOW (ref 98–111)
Creatinine, Ser: 7.75 mg/dL — ABNORMAL HIGH (ref 0.44–1.00)
GFR calc Af Amer: 6 mL/min — ABNORMAL LOW (ref >60)
GFR calc non Af Amer: 5 mL/min — ABNORMAL LOW (ref >60)
Glucose, Bld: 122 mg/dL — ABNORMAL HIGH (ref 70–99)
Potassium: 6 mmol/L — ABNORMAL HIGH (ref 3.5–5.1)
Sodium: 134 mmol/L — ABNORMAL LOW (ref 135–145)

## 2019-01-23 LAB — CBC WITH DIFFERENTIAL/PLATELET
Abs Immature Granulocytes: 0.05 10*3/uL (ref 0.00–0.07)
Basophils Absolute: 0 10*3/uL (ref 0.0–0.1)
Basophils Relative: 0 %
Eosinophils Absolute: 0.1 10*3/uL (ref 0.0–0.5)
Eosinophils Relative: 2 %
HCT: 15.2 % — ABNORMAL LOW (ref 36.0–46.0)
Hemoglobin: 4.8 g/dL — CL (ref 12.0–15.0)
Immature Granulocytes: 1 %
Lymphocytes Relative: 23 %
Lymphs Abs: 0.8 10*3/uL (ref 0.7–4.0)
MCH: 34.5 pg — ABNORMAL HIGH (ref 26.0–34.0)
MCHC: 31.6 g/dL (ref 30.0–36.0)
MCV: 109.4 fL — ABNORMAL HIGH (ref 80.0–100.0)
Monocytes Absolute: 0.4 10*3/uL (ref 0.1–1.0)
Monocytes Relative: 11 %
Neutro Abs: 2.2 10*3/uL (ref 1.7–7.7)
Neutrophils Relative %: 63 %
Platelets: 136 10*3/uL — ABNORMAL LOW (ref 150–400)
RBC: 1.39 MIL/uL — ABNORMAL LOW (ref 3.87–5.11)
RDW: 15.5 % (ref 11.5–15.5)
WBC: 3.6 10*3/uL — ABNORMAL LOW (ref 4.0–10.5)
nRBC: 0.8 % — ABNORMAL HIGH (ref 0.0–0.2)

## 2019-01-23 LAB — GLUCOSE, CAPILLARY
Glucose-Capillary: 104 mg/dL — ABNORMAL HIGH (ref 70–99)
Glucose-Capillary: 116 mg/dL — ABNORMAL HIGH (ref 70–99)
Glucose-Capillary: 125 mg/dL — ABNORMAL HIGH (ref 70–99)
Glucose-Capillary: 112 mg/dL — ABNORMAL HIGH (ref 70–99)

## 2019-01-23 LAB — ZINC: Zinc: 68 ug/dL (ref 56–134)

## 2019-01-23 LAB — COPPER, SERUM: Copper: 123 ug/dL (ref 72–166)

## 2019-01-23 NOTE — Progress Notes (Addendum)
The patient is holding her own, without transfusion.  Her hemoglobin has remained stable for the past 48 hours, going from 4.2 to 4.0 during that period of time.  Today, the patient looks more animated.  She is sitting up on the side of the bed, and actually appears quite chipper.  She is certainly in no evident acute distress although she does indicate she gets tired with minimal walking.  Her CT scan from last night did not show any discrete cause of GI blood loss.  It is noted that she has hepatosplenomegaly, which, along with her chronic thrombocytopenia, could be indicative of some form of occult liver disease with low-grade portal hypertension, which in turn could lead to issues with portal gastropathy or other sources of blood loss (note that no varices were noted on her EGD earlier this year.  Note that the patient has a history of diabetes, which could lead to liver disease from nonalcoholic steatohepatitis (NASH), and she also has a history of hepatitis C. it sounds as though the patient was previously treated with interferon and did not tolerated; her most recent hepatitis C viral quantitation was almost 4 years ago and was strongly elevated.  Incidental note is made of cholelithiasis on her CT scan, which I discussed with the patient.  Although she does experience periodic left-sided abdominal pain, she is not having attacks that sound compatible with biliary colic.  The patient states that some of her iron infusion fluid leaked out during the IV infusion yesterday, although from talking with the patient's attending hospitalist, it sounds like only a minimal amount of the medication was lost.  The patient does not have a primary care physician and cannot even tell me who her primary nephrologist is.  I have concerns about whether she might be lost to follow-up for testing and treatment, so I would favor keeping her in-house for the time being.   Recommendations:  1.  Update hepatitis C  viral quantitation (ordered) 2.  Consider infectious disease consultation (versus referral as outpatient) for consideration of treatment of hepatitis C 3.  Doppler ultrasound of liver to try to get some feeling for degree of portal hypertension (ordered for Monday) 4.  Hepatic elastography to assess degree of fibrosis (ordered for Monday) 5.  Consider getting the patient connected with a primary physician following discharge.  Cleotis Nipper, M.D. Pager [phone] If no answer or after 5 PM call [phone]

## 2019-01-23 NOTE — Progress Notes (Signed)
Patient continues to refuse to wear telemetry monitor and SCDs.  Educated on the importance of monitoring for changes in her heart rhythm and risk of DVT. Paged MD to request orders be d/c.

## 2019-01-23 NOTE — Progress Notes (Signed)
[facility] Progress Note   Subjective:  Capsule endoscopy negative for active bleeding. For CT abdomen today.  Alert, NAD. Denies CP/SOB. Continues to refuse transfusion  Short HD today   Objective Vitals:   01/22/19 0531 01/22/19 1216 01/22/19 2045 01/23/19 0644  BP: 115/62 (!) 119/40 (!) 134/59 (!) 132/55  Pulse: 72 72 72 71  Resp: 18 16 18 18   Temp: 98.2 F (36.8 C) 98.9 F (37.2 C) 97.6 F (36.4 C) 98 F (36.7 C)  TempSrc: Oral Oral Oral Oral  SpO2: 98% 100% 100% 94%  Weight:    74.7 kg  Height:        Physical Exam General: WNWD female  NAD supine in bed Heart: RRR  Lungs: CTAB  Abdomen: soft NTND Extremities: No LE edema  Dialysis Access: LUE AVG +bruit     Weight change:    Additional Objective Labs: Basic Metabolic Panel: Recent Labs  Lab 01/20/19 0741 01/20/19 0748 01/21/19 0413 01/22/19 0342  NA 134* 129* 136 134*  K 6.4* 6.2* 4.9 5.5*  CL 92* 96* 95* 93*  CO2 24  --  30 25  GLUCOSE 177* 168* 60* 191*  BUN 116* 126* 45* 69*  CREATININE 8.54* 9.30* 4.42* 5.97*  CALCIUM 9.3  --  8.1* 8.4*  PHOS  --   --   --  6.5*   CBC: Recent Labs  Lab 01/20/19 0741  01/21/19 0413  01/21/19 1836 01/21/19 2313 01/22/19 0342  WBC 3.2*  --  2.1*  --   --   --  2.5*  NEUTROABS 1.9  --   --   --   --   --   --   HGB 5.6*   < > 4.2*   < > 4.5* 5.0* 4.0*  HCT 17.9*   < > 13.3*   < > 14.1* 15.9* 12.6*  MCV 109.8*  --  107.3*  --   --   --  108.6*  PLT 124*  --  102*  --   --   --  103*   < > = values in this interval not displayed.     Medications: . ferric gluconate (FERRLECIT/NULECIT) IV Stopped (01/22/19 1256)   . allopurinol  200 mg Oral Daily  . atorvastatin  40 mg Oral QHS  . calcitRIOL  1 mcg Oral Q T,Th,Sat-1800  . Chlorhexidine Gluconate Cloth  6 each Topical Q0600  . Chlorhexidine Gluconate Cloth  6 each Topical Q0600  . darbepoetin (ARANESP) injection - DIALYSIS  200 mcg Intravenous Q Wed-HD  . DULoxetine  60 mg Oral Daily    And  . DULoxetine  30 mg Oral Daily  . ferric citrate  420 mg Oral TID WC  . folic acid  1 mg Oral Daily  . insulin aspart  1-5 Units Subcutaneous TID WC  . insulin glargine  20 Units Subcutaneous QHS  . labetalol  200 mg Oral BID  . multivitamin  1 tablet Oral QHS  . pantoprazole  40 mg Oral BID  . saccharomyces boulardii  250 mg Oral Daily  . senna  2 tablet Oral Daily  . sodium chloride flush  3 mL Intravenous Q12H    GOC TTS 3.5h 400/A 2.0 EDW 72.5kg 2K/2Ca Profile 4 L AVG  No heparin  --Calcitriol 2.0 PO TIW  -- Venofer 50 mg q week  -- Mircera 225 mcg IV q 2 weeks (last dosed 12/03/18)  Recent OP Labs: 5/21: Hgb 10.1 Tsat 46% Ferritin 1067 Ca  10.2 Alb 4.0   iPTH  294  Assessment/Plan: 1. Recurrent GI bleed/symptomatic anemia --Hgb 6.1>4.2. Patient is Jehovah's Witness and does not want to receive blood products today. GI following. Flexible sigmoidoscopy today with no active bleed found. No active bleeding on capsule study. For CT abd today  2. Hyperkalemia -trending back up -- s/p 10 lokelma 3.  ESRD -  Usually TTS. Had HD 6/17 off schedule.  Unsafe for HD with Hgb <6 but will try with shorter time, lower BFR to stress the system less/  4. Hypertension/volume  - Hypertensive on admission. Improved with HD. Net UF 2.5L on 6/17 Post HD wt 73.1kg . Avoid low BP with low hgb  5. Anemia  - As above.  Aranesp 200 dosed with HD 6/17. Added supplemental Fe - need to use pediatric tubes to minimize blood loss. 6.  Metabolic bone disease -  Continue VDRA/Fe based binders  7.  Nutrition - Renal diet/vitamins as able - encouraged eating  8.    DM - per primary   9.    Thrombocytopenia - no heparin - follow 10.  Anxiety/depression/bipolar d/o  Lynnda Child PA-C [facility] Pager [phone] 01/23/2019,11:03 AM

## 2019-01-23 NOTE — Progress Notes (Signed)
PROGRESS NOTE  ADIRA LIMBURG DXI:[id number] DOB: 1966-02-04 DOA: 01/20/2019 PCP: Patient, [name]   LOS: 3 days   Brief narrative: Patient is a 53 year old female with history of ESRD on HD TTS, Jehovah's Witness, combined CHF, DM-2, hepatitis C, PVD status post left TMA, anemia of GI bleed, anxiety/depression/bipolar disorder admitted with symptomatic anemia likely due to hematochezia and melena.  Patient was previously admitted with rectal bleeding and received blood transfusionbut she regrets accepting blood transfusion at the time  On admission, hemodynamically stable. Hemoglobin 5.6 and patient declined transfusion.Potassium 6.4 received Lokelma. BUN 116. Creatinine 8.57. Nephrology and GI consulted,and patient admitted for further care.  Had HD on 6/17.Hyperkalemia and azotemia resolved.   On 6/18, hemoglobin dropped further to 4.2.She continues to refuse blood transfusion this time.She had flex sigmoidoscopy that showed dark stool in the rectum and in the sigmoid colon, minimally burgundy-tinged stool and mucosa, but no frank bloodor obvious source of bleeding.  Capsule endoscopy negative as well.  Subjective: Patient was seen and examined this morning.  Middle-aged African-American female.  Lying down in bed.  Not in distress.  Hemoglobin improved from 4 yesterday to 4.8 today.  Assessment/Plan:  Principal Problem:   GI bleed Active Problems:   Essential hypertension   Chronic pain syndrome   Hyperkalemia   ESRD on dialysis (HCC)   Prolonged QT interval   Acute blood loss anemia  Symptomatic acute blood loss anemia due to GI bleed (hematochezia and melena) with underlying anemia of chronic disease:Hemoglobin 5.6 on admission and dropped as low as 4, 4.8 this morning.  -Hemodynamically stable. -Patient continues to decline blood transfusion -Flex sigmoidoscopy on 6/18 as above -Capsule endoscopy on 6/18 negative as well. -Continue renal diet  -Continue Protonix twice daily  -Anemia panel consistent with anemia of chronic disease. -Aranesp andferric citrate per nephrology  Pancytopenia:suspect this to be due to nutritional deficiency from ongoing GI bleed -Previous hospitalist discussed this withDr. Zhao,oncology over the phone who suggested checking TFT and copper level.  Both levels normal. -Continue monitoring  ESRD on HD TTS/BMD/azotemia/hyperkalemia:Missed dialysis on 6/16. Had HD on 6/17 -For dialysis today.  Well-controlled IDDM-2 with renal complication and hypoglycemia: A1c 5.9%. -CBG monitoring -Continue customized SSIto0-5 units  Essential hypertension: Normotensive -Continue home labetalol -Home amlodipine on hold.  Chronic pain: Stable -Continue home Cymbalta and PRN oxycodone.  Anxiety/depression/bipolar disorder: Stable -Continue home meds (Xanax, Cymbalta and trazodone).  Noncompliance:Patient has been refusing towear SCD andcardiac monitor  Body mass index is 27.66 kg/m. Mobility: Encourage ambulation Diet: Renal diet DVT prophylaxis: SCD Code Status:  Code Status: Full Code  Family Communication: Expected Discharge: Continue inpatient management for now  Consultants:  Nephrology  GI  Oncology over the phone  Procedures:  Flex sigmoidoscopy on 6/18  Capsule endoscopy on 6/18  Microbiology:  [id number] negative  Antimicrobials:     Anti-infectives (From admission, onward)   None      Antimicrobials:  Anti-infectives (From admission, onward)   None      Infusions:  . ferric gluconate (FERRLECIT/NULECIT) IV Stopped (01/22/19 1256)    Scheduled Meds: . allopurinol  200 mg Oral Daily  . atorvastatin  40 mg Oral QHS  . calcitRIOL  1 mcg Oral Q T,Th,Sat-1800  . Chlorhexidine Gluconate Cloth  6 each Topical Q0600  . Chlorhexidine Gluconate Cloth  6 each Topical Q0600  . darbepoetin (ARANESP) injection - DIALYSIS  200 mcg Intravenous Q Wed-HD   . DULoxetine  60 mg Oral Daily   And  .  DULoxetine  30 mg Oral Daily  . ferric citrate  420 mg Oral TID WC  . folic acid  1 mg Oral Daily  . insulin aspart  1-5 Units Subcutaneous TID WC  . insulin glargine  20 Units Subcutaneous QHS  . labetalol  200 mg Oral BID  . multivitamin  1 tablet Oral QHS  . pantoprazole  40 mg Oral BID  . saccharomyces boulardii  250 mg Oral Daily  . senna  2 tablet Oral Daily  . sodium chloride flush  3 mL Intravenous Q12H    PRN meds: acetaminophen **OR** acetaminophen, albuterol, ALPRAZolam, diphenhydrAMINE, diphenhydrAMINE-zinc acetate, mupirocin ointment, oxyCODONE   Objective: Vitals:   01/23/19 0644 01/23/19 1212  BP: (!) 132/55 (!) 123/51  Pulse: 71 70  Resp: 18 18  Temp: 98 F (36.7 C) 97.6 F (36.4 C)  SpO2: 94% 99%    Intake/Output Summary (Last 24 hours) at 01/23/2019 1530 Last data filed at 01/23/2019 0925 Gross per 24 hour  Intake 360 ml  Output 1 ml  Net 359 ml   Filed Weights   01/20/19 1145 01/20/19 1529 01/23/19 0644  Weight: 75.8 kg 73.1 kg 74.7 kg   Weight change:  Body mass index is 28.25 kg/m.   Physical Exam: General exam: Not in distress, feels better than yesterday Skin: No rashes, lesions or ulcers. HEENT: Atraumatic, normocephalic, supple neck, no obvious bleeding Lungs: Clear to auscultation bilaterally CVS: Regular rate and rhythm, no murmur GI/Abd soft, nontender, nondistended, bowel sounds present CNS: Alert, awake, oriented x3 Psychiatry: Mood appropriate Extremities: Pedal edema 1+ bilaterally  Data Review: I have personally reviewed the laboratory data and studies available.  Recent Labs  Lab 01/20/19 0741  01/21/19 0413 01/21/19 1059 01/21/19 1836 01/21/19 2313 01/22/19 0342 01/23/19 1010  WBC 3.2*  --  2.1*  --   --   --  2.5* 3.6*  NEUTROABS 1.9  --   --   --   --   --   --  2.2  HGB 5.6*   < > 4.2* 4.7* 4.5* 5.0* 4.0* 4.8*  HCT 17.9*   < > 13.3* 14.7* 14.1* 15.9* 12.6* 15.2*  MCV  109.8*  --  107.3*  --   --   --  108.6* 109.4*  PLT 124*  --  102*  --   --   --  103* 136*   < > = values in this interval not displayed.   Recent Labs  Lab 01/20/19 0741 01/20/19 0748 01/21/19 0413 01/22/19 0342 01/23/19 1010  NA 134* 129* 136 134* 134*  K 6.4* 6.2* 4.9 5.5* 6.0*  CL 92* 96* 95* 93* 91*  CO2 24  --  30 25 25   GLUCOSE 177* 168* 60* 191* 122*  BUN 116* 126* 45* 69* 82*  CREATININE 8.54* 9.30* 4.42* 5.97* 7.75*  CALCIUM 9.3  --  8.1* 8.4* 9.0  MG  --   --   --  2.3  --   PHOS  --   --   --  6.5*  --     Terrilee Croak, MD  [facility] 01/23/2019

## 2019-01-24 LAB — CBC WITH DIFFERENTIAL/PLATELET
Abs Immature Granulocytes: 0.03 10*3/uL (ref 0.00–0.07)
Basophils Absolute: 0 10*3/uL (ref 0.0–0.1)
Basophils Relative: 0 %
Eosinophils Absolute: 0.1 10*3/uL (ref 0.0–0.5)
Eosinophils Relative: 2 %
HCT: 16.5 % — ABNORMAL LOW (ref 36.0–46.0)
Hemoglobin: 5.2 g/dL — CL (ref 12.0–15.0)
Immature Granulocytes: 1 %
Lymphocytes Relative: 17 %
Lymphs Abs: 0.6 10*3/uL — ABNORMAL LOW (ref 0.7–4.0)
MCH: 35.1 pg — ABNORMAL HIGH (ref 26.0–34.0)
MCHC: 31.5 g/dL (ref 30.0–36.0)
MCV: 111.5 fL — ABNORMAL HIGH (ref 80.0–100.0)
Monocytes Absolute: 0.4 10*3/uL (ref 0.1–1.0)
Monocytes Relative: 12 %
Neutro Abs: 2.4 10*3/uL (ref 1.7–7.7)
Neutrophils Relative %: 68 %
Platelets: 175 10*3/uL (ref 150–400)
RBC: 1.48 MIL/uL — ABNORMAL LOW (ref 3.87–5.11)
RDW: 16.1 % — ABNORMAL HIGH (ref 11.5–15.5)
WBC: 3.5 10*3/uL — ABNORMAL LOW (ref 4.0–10.5)
nRBC: 1.4 % — ABNORMAL HIGH (ref 0.0–0.2)

## 2019-01-24 LAB — BASIC METABOLIC PANEL
Anion gap: 14 (ref 5–15)
BUN: 25 mg/dL — ABNORMAL HIGH (ref 6–20)
CO2: 26 mmol/L (ref 22–32)
Calcium: 9 mg/dL (ref 8.9–10.3)
Chloride: 93 mmol/L — ABNORMAL LOW (ref 98–111)
Creatinine, Ser: 3.84 mg/dL — ABNORMAL HIGH (ref 0.44–1.00)
GFR calc Af Amer: 15 mL/min — ABNORMAL LOW (ref >60)
GFR calc non Af Amer: 13 mL/min — ABNORMAL LOW (ref >60)
Glucose, Bld: 119 mg/dL — ABNORMAL HIGH (ref 70–99)
Potassium: 3.3 mmol/L — ABNORMAL LOW (ref 3.5–5.1)
Sodium: 133 mmol/L — ABNORMAL LOW (ref 135–145)

## 2019-01-24 LAB — GLUCOSE, CAPILLARY
Glucose-Capillary: 110 mg/dL — ABNORMAL HIGH (ref 70–99)
Glucose-Capillary: 164 mg/dL — ABNORMAL HIGH (ref 70–99)
Glucose-Capillary: 195 mg/dL — ABNORMAL HIGH (ref 70–99)
Glucose-Capillary: 183 mg/dL — ABNORMAL HIGH (ref 70–99)

## 2019-01-24 NOTE — Progress Notes (Signed)
Hemoglobin has risen from 4.0 to 4.8 to 5.2 over the past 48 hours, without transfusion.   Patient is feeling somewhat better, although still somewhat listless.  Liver evaluation planned for tomorrow, to see if occult portal hypertension could be contributing to the patient's chronic blood loss.  (Please see yesterday's note.)  Cleotis Nipper, M.D. Pager [phone] If no answer or after 5 PM call [phone]

## 2019-01-24 NOTE — Progress Notes (Addendum)
Refused bp meds this morning. Stating her bp is fine and she doesn't want it to drop lower. Educated patient on the use of metoprolol and patient still refused to take medication.   Patient continues to refuse to wear telemetry monitor continuously.

## 2019-01-24 NOTE — Progress Notes (Signed)
[facility] Progress Note   Subjective:  Tolerated HD yesterday. Alert, fatigued but NAD.  Liver studies per GI   Objective Vitals:   01/23/19 2230 01/23/19 2247 01/23/19 2323 01/24/19 0604  BP: (!) 154/64 (!) 154/65 (!) 127/50 (!) 152/63  Pulse: 90 85 83 87  Resp: 19 15    Temp:  98.9 F (37.2 C) 99.1 F (37.3 C) 98.6 F (37 C)  TempSrc:  Oral Oral Oral  SpO2:  98% 91% 95%  Weight:  72.4 kg    Height:        Physical Exam General: WNWD female  NAD supine in bed Heart: RRR  Lungs: CTAB  Abdomen: soft NTND Extremities: No LE edema  Dialysis Access: LUE AVG +bruit     Weight change: 0.438 kg   Additional Objective Labs: Basic Metabolic Panel: Recent Labs  Lab 01/22/19 0342 01/23/19 1010 01/24/19 0457  NA 134* 134* 133*  K 5.5* 6.0* 3.3*  CL 93* 91* 93*  CO2 25 25 26   GLUCOSE 191* 122* 119*  BUN 69* 82* 25*  CREATININE 5.97* 7.75* 3.84*  CALCIUM 8.4* 9.0 9.0  PHOS 6.5*  --   --    CBC: Recent Labs  Lab 01/20/19 0741  01/21/19 0413  01/22/19 0342 01/23/19 1010 01/24/19 0457  WBC 3.2*  --  2.1*  --  2.5* 3.6* 3.5*  NEUTROABS 1.9  --   --   --   --  2.2 2.4  HGB 5.6*   < > 4.2*   < > 4.0* 4.8* 5.2*  HCT 17.9*   < > 13.3*   < > 12.6* 15.2* 16.5*  MCV 109.8*  --  107.3*  --  108.6* 109.4* 111.5*  PLT 124*  --  102*  --  103* 136* 175   < > = values in this interval not displayed.     Medications: . ferric gluconate (FERRLECIT/NULECIT) IV 125 mg (01/23/19 2215)   . allopurinol  200 mg Oral Daily  . atorvastatin  40 mg Oral QHS  . calcitRIOL  1 mcg Oral Q T,Th,Sat-1800  . Chlorhexidine Gluconate Cloth  6 each Topical Q0600  . Chlorhexidine Gluconate Cloth  6 each Topical Q0600  . darbepoetin (ARANESP) injection - DIALYSIS  200 mcg Intravenous Q Wed-HD  . DULoxetine  60 mg Oral Daily   And  . DULoxetine  30 mg Oral Daily  . ferric citrate  420 mg Oral TID WC  . folic acid  1 mg Oral Daily  . insulin aspart  1-5 Units  Subcutaneous TID WC  . insulin glargine  20 Units Subcutaneous QHS  . labetalol  200 mg Oral BID  . multivitamin  1 tablet Oral QHS  . pantoprazole  40 mg Oral BID  . saccharomyces boulardii  250 mg Oral Daily  . senna  2 tablet Oral Daily  . sodium chloride flush  3 mL Intravenous Q12H    GOC TTS 3.5h 400/A 2.0 EDW 72.5kg 2K/2Ca Profile 4 L AVG  No heparin  --Calcitriol 2.0 PO TIW  -- Venofer 50 mg q week  -- Mircera 225 mcg IV q 2 weeks (last dosed 12/03/18)  Recent OP Labs: 5/21: Hgb 10.1 Tsat 46% Ferritin 1067 Ca 10.2 Alb 4.0   iPTH  294  Assessment/Plan: 1. Recurrent GI bleed/symptomatic anemia --Hgb 6.1>4.2> 5.2. Patient is Jehovah's Witness and has refused blood transfusion this admission.  GI following. Flexible sigmoidoscopy today with no active bleed found. No active bleeding on  capsule study. Concern for occult liver disease (Hx Hep C)  as a possible source of bleeding - For additional hepatic studies per GI  2. Hyperkalemia - Resolved with HD. K 3.3.  3.  ESRD -  Usually TTS. Last HD 6/20 with shorter time, lower BFR/DFR.  4. Hypertension/volume  - Hypertensive on admission. Improving with HD. Net UF 2L on 6/20. At dry weigth. Avoid low BP with low hgb  5. Anemia  - As above.  Aranesp 200 dosed with HD 6/17. Added supplemental Fe - need to use pediatric tubes to minimize blood loss. 6.  Metabolic bone disease -  Continue VDRA/Fe based binders  7.  Nutrition - Renal diet/vitamins as able - encouraged eating  8.    DM - per primary   9.    Thrombocytopenia - no heparin. Improving.  10.  Anxiety/depression/bipolar d/o  Lynnda Child PA-C [facility] Pager [phone] 01/24/2019,11:39 AM

## 2019-01-24 NOTE — Progress Notes (Signed)
PROGRESS NOTE  SIDNEY SILBERMAN [id number]:[id number] DOB: 07/12/1966 DOA: 01/20/2019 PCP: Patient, [name]   LOS: 4 days   Brief narrative: Patient is a21 year old female with history of ESRD on HD TTS, Jehovah's Witness, combined CHF, DM-2, hepatitis C, PVD status post left TMA, anemia of GI bleed, anxiety/depression/bipolar disorder admitted with symptomatic anemia likely due to hematochezia and melena.  Patient was previously admitted with rectal bleeding and received blood transfusionbutshe regrets accepting blood transfusion at the time  On admission, hemodynamically stable. Hemoglobin 5.6 and patient declined transfusion.Potassium 6.4 received Lokelma. BUN 116. Creatinine 8.57. Nephrology and GI consulted,and patient admitted for further care.  Had HD on 6/17.Hyperkalemia and azotemia resolved.   On 6/18, hemoglobin dropped further to 4.2.She continues torefuseblood transfusionthis time.She had flex sigmoidoscopy that showed dark stool in the rectum and in the sigmoid colon, minimally burgundy-tinged stool and mucosa, but no frank bloodor obvious source of bleeding.  Capsule endoscopy negative as well.  Subjective: Patient was seen and examined this morning.  Lying down in bed.  Feels better.  But on oxygen by nasal cannula now.  Assessment/Plan:  Principal Problem:   GI bleed Active Problems:   Essential hypertension   Chronic pain syndrome   Hyperkalemia   ESRD on dialysis (HCC)   Prolonged QT interval   Acute blood loss anemia  Acute blood loss anemia Symptomatic acute blood loss anemia due to GI bleed (hematochezia and melena) with underlying anemia of chronic disease:Hemoglobin 5.6 on admission and dropped as low as 4, -Hemodynamically stable. -Hemoglobin improved to 5.2 this morning.  Without transfusion. -Patient continues todeclineblood transfusion -Flex sigmoidoscopy on 6/18 as above -Capsule endoscopy on 6/18 negative as well.  -Continuerenal diet -Continue Protonix twice daily -Anemia panel consistent with anemia of chronic disease. -Aranesp andferric citrate per nephrology -Per GI note, patient is planned for portal hypertension evaluation tomorrow.  Pancytopenia:suspect this to be due to nutritional deficiency from ongoing GI bleed -Previous hospitalist discussedthiswithDr. Zhao,oncology over the phone who suggested checking TFT and copper level.  Both levels normal. -Continue monitoring  ESRD on HD TTS/BMD/azotemia/hyperkalemia:Missed dialysis on 6/16. Had HD on 6/17 -For next dialysis on Tuesday 6/23.  Well-controlled IDDM-2 with renal complication and hypoglycemia: A1c 5.9%. -CBG monitoring -Continue customized SSIto0-5 units  Essential hypertension: Normotensive -Continue home labetalol -Home amlodipine on hold.  Chronic pain: Stable -Continue home Cymbalta and PRN oxycodone.  Anxiety/depression/bipolar disorder: Stable -Continue home meds (Xanax, Cymbalta and trazodone).  Noncompliance:Patient has been refusing towear SCD andcardiac monitor  Body mass index is 27.66 kg/m. Mobility:Encourage ambulation Diet:Renal diet DVT prophylaxis:SCD Code Status:Code Status: Full Code Family Communication: Expected Discharge:Continue inpatient management for now  Consultants:  Nephrology  GI  Oncology over the phone  Procedures:  Flex sigmoidoscopy on 6/18  Capsule endoscopy on 6/18  Microbiology:  COVID-19 negative  Antimicrobials:  Anti-infectives (From admission, onward)   None      Infusions:  . ferric gluconate (FERRLECIT/NULECIT) IV 125 mg (01/23/19 2215)    Scheduled Meds: . allopurinol  200 mg Oral Daily  . atorvastatin  40 mg Oral QHS  . calcitRIOL  1 mcg Oral Q T,Th,Sat-1800  . Chlorhexidine Gluconate Cloth  6 each Topical Q0600  . Chlorhexidine Gluconate Cloth  6 each Topical Q0600  . darbepoetin (ARANESP) injection -  DIALYSIS  200 mcg Intravenous Q Wed-HD  . DULoxetine  60 mg Oral Daily   And  . DULoxetine  30 mg Oral Daily  . ferric citrate  420 mg Oral TID  WC  . folic acid  1 mg Oral Daily  . insulin aspart  1-5 Units Subcutaneous TID WC  . insulin glargine  20 Units Subcutaneous QHS  . labetalol  200 mg Oral BID  . multivitamin  1 tablet Oral QHS  . pantoprazole  40 mg Oral BID  . saccharomyces boulardii  250 mg Oral Daily  . senna  2 tablet Oral Daily  . sodium chloride flush  3 mL Intravenous Q12H    PRN meds: acetaminophen **OR** acetaminophen, albuterol, ALPRAZolam, diphenhydrAMINE, diphenhydrAMINE-zinc acetate, mupirocin ointment, oxyCODONE   Objective: Vitals:   01/24/19 0604 01/24/19 1302  BP: (!) 152/63 (!) 145/64  Pulse: 87 89  Resp:    Temp: 98.6 F (37 C) 98.4 F (36.9 C)  SpO2: 95% 100%    Intake/Output Summary (Last 24 hours) at 01/24/2019 1449 Last data filed at 01/24/2019 0912 Gross per 24 hour  Intake 120 ml  Output 2000 ml  Net -1880 ml   Filed Weights   01/23/19 0644 01/23/19 1931 01/23/19 2247  Weight: 74.7 kg 75.1 kg 72.4 kg   Weight change: 0.438 kg Body mass index is 27.4 kg/m.   Physical Exam: General exam: Appears calm and comfortable.  Skin: No rashes, lesions or ulcers. HEENT: Atraumatic, normocephalic, supple neck, no obvious bleeding Lungs: Clear to auscultation bilaterally CVS: Regular rate and rhythm, no murmur GI/Abd soft, nontender, nondistended, bowel sound present CNS: Alert, awake, oriented x3 Psychiatry: Mood appropriate Extremities: Trace to 1+ bilateral pitting pedal edema  Data Review: I have personally reviewed the laboratory data and studies available.  Recent Labs  Lab 01/20/19 0741  01/21/19 0413  01/21/19 1836 01/21/19 2313 01/22/19 0342 01/23/19 1010 01/24/19 0457  WBC 3.2*  --  2.1*  --   --   --  2.5* 3.6* 3.5*  NEUTROABS 1.9  --   --   --   --   --   --  2.2 2.4  HGB 5.6*   < > 4.2*   < > 4.5* 5.0* 4.0* 4.8*  5.2*  HCT 17.9*   < > 13.3*   < > 14.1* 15.9* 12.6* 15.2* 16.5*  MCV 109.8*  --  107.3*  --   --   --  108.6* 109.4* 111.5*  PLT 124*  --  102*  --   --   --  103* 136* 175   < > = values in this interval not displayed.   Recent Labs  Lab 01/20/19 0741 01/20/19 0748 01/21/19 0413 01/22/19 0342 01/23/19 1010 01/24/19 0457  NA 134* 129* 136 134* 134* 133*  K 6.4* 6.2* 4.9 5.5* 6.0* 3.3*  CL 92* 96* 95* 93* 91* 93*  CO2 24  --  30 25 25 26   GLUCOSE 177* 168* 60* 191* 122* 119*  BUN 116* 126* 45* 69* 82* 25*  CREATININE 8.54* 9.30* 4.42* 5.97* 7.75* 3.84*  CALCIUM 9.3  --  8.1* 8.4* 9.0 9.0  MG  --   --   --  2.3  --   --   PHOS  --   --   --  6.5*  --   --     Terrilee Croak, MD  [facility] 01/24/2019

## 2019-01-25 ENCOUNTER — Inpatient Hospital Stay (HOSPITAL_COMMUNITY): Payer: Medicare Other

## 2019-01-25 LAB — CBC WITH DIFFERENTIAL/PLATELET
Abs Immature Granulocytes: 0.02 10*3/uL (ref 0.00–0.07)
Basophils Absolute: 0 10*3/uL (ref 0.0–0.1)
Basophils Relative: 0 %
Eosinophils Absolute: 0.1 10*3/uL (ref 0.0–0.5)
Eosinophils Relative: 3 %
HCT: 16.1 % — ABNORMAL LOW (ref 36.0–46.0)
Hemoglobin: 4.9 g/dL — CL (ref 12.0–15.0)
Immature Granulocytes: 1 %
Lymphocytes Relative: 24 %
Lymphs Abs: 0.7 10*3/uL (ref 0.7–4.0)
MCH: 33.8 pg (ref 26.0–34.0)
MCHC: 30.4 g/dL (ref 30.0–36.0)
MCV: 111 fL — ABNORMAL HIGH (ref 80.0–100.0)
Monocytes Absolute: 0.4 10*3/uL (ref 0.1–1.0)
Monocytes Relative: 13 %
Neutro Abs: 1.9 10*3/uL (ref 1.7–7.7)
Neutrophils Relative %: 59 %
Platelets: 162 10*3/uL (ref 150–400)
RBC: 1.45 MIL/uL — ABNORMAL LOW (ref 3.87–5.11)
RDW: 16.7 % — ABNORMAL HIGH (ref 11.5–15.5)
WBC: 3.1 10*3/uL — ABNORMAL LOW (ref 4.0–10.5)
nRBC: 2.6 % — ABNORMAL HIGH (ref 0.0–0.2)

## 2019-01-25 LAB — BASIC METABOLIC PANEL
Anion gap: 12 (ref 5–15)
BUN: 37 mg/dL — ABNORMAL HIGH (ref 6–20)
CO2: 26 mmol/L (ref 22–32)
Calcium: 8.9 mg/dL (ref 8.9–10.3)
Chloride: 92 mmol/L — ABNORMAL LOW (ref 98–111)
Creatinine, Ser: 5.8 mg/dL — ABNORMAL HIGH (ref 0.44–1.00)
GFR calc Af Amer: 9 mL/min — ABNORMAL LOW (ref >60)
GFR calc non Af Amer: 8 mL/min — ABNORMAL LOW (ref >60)
Glucose, Bld: 170 mg/dL — ABNORMAL HIGH (ref 70–99)
Potassium: 3.4 mmol/L — ABNORMAL LOW (ref 3.5–5.1)
Sodium: 130 mmol/L — ABNORMAL LOW (ref 135–145)

## 2019-01-25 LAB — GLUCOSE, CAPILLARY
Glucose-Capillary: 131 mg/dL — ABNORMAL HIGH (ref 70–99)
Glucose-Capillary: 148 mg/dL — ABNORMAL HIGH (ref 70–99)
Glucose-Capillary: 159 mg/dL — ABNORMAL HIGH (ref 70–99)
Glucose-Capillary: 155 mg/dL — ABNORMAL HIGH (ref 70–99)

## 2019-01-25 MED ORDER — CHLORHEXIDINE GLUCONATE CLOTH 2 % EX PADS
6.0000 | MEDICATED_PAD | Freq: Every day | CUTANEOUS | Status: DC
  Administered 2019-01-25 – 2019-01-26 (×2): 6 via TOPICAL

## 2019-01-25 NOTE — Progress Notes (Signed)
Subjective: The patient was seen and examined at bedside. States her last bowel movement yesterday was formed and dark which she believes is related to oral iron use.  Objective: Vital signs in last 24 hours: Temp:  [98 F (36.7 C)-98.4 F (36.9 C)] 98 F (36.7 C) (06/22 3500) Pulse Rate:  [65-93] 65 (06/22 0614) BP: (143-159)/(63-70) 143/63 (06/22 0614) SpO2:  [98 %-100 %] 98 % (06/22 9381) Weight change:  Last BM Date: 01/25/19  PE: Lying comfortably on bed, not in distress GENERAL: Overweight, prominent pallor ABDOMEN: Nondistended EXTREMITIES: no deformity  Lab Results: Results for orders placed or performed during the hospital encounter of 01/20/19 (from the past 48 hour(s))  Glucose, capillary     Status: Abnormal   Collection Time: 01/23/19  4:44 PM  Result Value Ref Range   Glucose-Capillary 125 (H) 70 - 99 mg/dL  Glucose, capillary     Status: Abnormal   Collection Time: 01/23/19 11:27 PM  Result Value Ref Range   Glucose-Capillary 112 (H) 70 - 99 mg/dL   Comment 1 Document in Chart   CBC with Differential/Platelet     Status: Abnormal   Collection Time: 01/24/19  4:57 AM  Result Value Ref Range   WBC 3.5 (L) 4.0 - 10.5 K/uL   RBC 1.48 (L) 3.87 - 5.11 MIL/uL   Hemoglobin 5.2 (LL) 12.0 - 15.0 g/dL    Comment: REPEATED TO VERIFY CRITICAL VALUE NOTED.  VALUE IS CONSISTENT WITH PREVIOUSLY REPORTED AND CALLED VALUE.    HCT 16.5 (L) 36.0 - 46.0 %   MCV 111.5 (H) 80.0 - 100.0 fL   MCH 35.1 (H) 26.0 - 34.0 pg   MCHC 31.5 30.0 - 36.0 g/dL   RDW 16.1 (H) 11.5 - 15.5 %   Platelets 175 150 - 400 K/uL   nRBC 1.4 (H) 0.0 - 0.2 %   Neutrophils Relative % 68 %   Neutro Abs 2.4 1.7 - 7.7 K/uL   Lymphocytes Relative 17 %   Lymphs Abs 0.6 (L) 0.7 - 4.0 K/uL   Monocytes Relative 12 %   Monocytes Absolute 0.4 0.1 - 1.0 K/uL   Eosinophils Relative 2 %   Eosinophils Absolute 0.1 0.0 - 0.5 K/uL   Basophils Relative 0 %   Basophils Absolute 0.0 0.0 - 0.1 K/uL   Immature  Granulocytes 1 %   Abs Immature Granulocytes 0.03 0.00 - 0.07 K/uL   Polychromasia PRESENT     Comment: Performed at [facility], [address], [place], [place] [address]  Basic metabolic panel     Status: Abnormal   Collection Time: 01/24/19  4:57 AM  Result Value Ref Range   Sodium 133 (L) 135 - 145 mmol/L   Potassium 3.3 (L) 3.5 - 5.1 mmol/L   Chloride 93 (L) 98 - 111 mmol/L   CO2 26 22 - 32 mmol/L   Glucose, Bld 119 (H) 70 - 99 mg/dL   BUN 25 (H) 6 - 20 mg/dL   Creatinine, Ser 3.84 (H) 0.44 - 1.00 mg/dL    Comment: DELTA CHECK NOTED   Calcium 9.0 8.9 - 10.3 mg/dL   GFR calc non Af Amer 13 (L) >60 mL/min   GFR calc Af Amer 15 (L) >60 mL/min   Anion gap 14 5 - 15    Comment: Performed at [facility] [address], [place], [place] [address]  Glucose, capillary     Status: Abnormal   Collection Time: 01/24/19  7:47 AM  Result Value Ref Range   Glucose-Capillary 110 (H) 70 - 99 mg/dL  Glucose, capillary     Status: Abnormal   Collection Time: 01/24/19  1:02 PM  Result Value Ref Range   Glucose-Capillary 164 (H) 70 - 99 mg/dL  Glucose, capillary     Status: Abnormal   Collection Time: 01/24/19  5:25 PM  Result Value Ref Range   Glucose-Capillary 195 (H) 70 - 99 mg/dL  Glucose, capillary     Status: Abnormal   Collection Time: 01/24/19 10:51 PM  Result Value Ref Range   Glucose-Capillary 183 (H) 70 - 99 mg/dL   Comment 1 Notify RN    Comment 2 Document in Chart   CBC with Differential/Platelet     Status: Abnormal   Collection Time: 01/25/19  4:57 AM  Result Value Ref Range   WBC 3.1 (L) 4.0 - 10.5 K/uL   RBC 1.45 (L) 3.87 - 5.11 MIL/uL   Hemoglobin 4.9 (LL) 12.0 - 15.0 g/dL    Comment: REPEATED TO VERIFY CRITICAL VALUE NOTED.  VALUE IS CONSISTENT WITH PREVIOUSLY REPORTED AND CALLED VALUE.    HCT 16.1 (L) 36.0 - 46.0 %   MCV 111.0 (H) 80.0 - 100.0 fL   MCH 33.8 26.0 - 34.0 pg   MCHC 30.4 30.0 - 36.0 g/dL   RDW 16.7 (H) 11.5 - 15.5 %   Platelets 162  150 - 400 K/uL   nRBC 2.6 (H) 0.0 - 0.2 %   Neutrophils Relative % 59 %   Neutro Abs 1.9 1.7 - 7.7 K/uL   Lymphocytes Relative 24 %   Lymphs Abs 0.7 0.7 - 4.0 K/uL   Monocytes Relative 13 %   Monocytes Absolute 0.4 0.1 - 1.0 K/uL   Eosinophils Relative 3 %   Eosinophils Absolute 0.1 0.0 - 0.5 K/uL   Basophils Relative 0 %   Basophils Absolute 0.0 0.0 - 0.1 K/uL   Immature Granulocytes 1 %   Abs Immature Granulocytes 0.02 0.00 - 0.07 K/uL    Comment: Performed at [facility] [address], [place], [place] [address]  Basic metabolic panel     Status: Abnormal   Collection Time: 01/25/19  4:57 AM  Result Value Ref Range   Sodium 130 (L) 135 - 145 mmol/L   Potassium 3.4 (L) 3.5 - 5.1 mmol/L   Chloride 92 (L) 98 - 111 mmol/L   CO2 26 22 - 32 mmol/L   Glucose, Bld 170 (H) 70 - 99 mg/dL   BUN 37 (H) 6 - 20 mg/dL   Creatinine, Ser 5.80 (H) 0.44 - 1.00 mg/dL    Comment: DELTA CHECK NOTED   Calcium 8.9 8.9 - 10.3 mg/dL   GFR calc non Af Amer 8 (L) >60 mL/min   GFR calc Af Amer 9 (L) >60 mL/min   Anion gap 12 5 - 15    Comment: Performed at [facility] [address], [place], [place] [address]  Glucose, capillary     Status: Abnormal   Collection Time: 01/25/19  8:26 AM  Result Value Ref Range   Glucose-Capillary 131 (H) 70 - 99 mg/dL  Glucose, capillary     Status: Abnormal   Collection Time: 01/25/19 12:05 PM  Result Value Ref Range   Glucose-Capillary 148 (H) 70 - 99 mg/dL    Studies/Results: US Liver Doppler  Result Date: 01/25/2019 CLINICAL DATA:  Thrombocytopenia. EXAM: DUPLEX ULTRASOUND OF LIVER TECHNIQUE: Color and duplex Doppler ultrasound was performed to evaluate  the hepatic in-flow and out-flow vessels. COMPARISON:  CT scan of January 22, 2019. FINDINGS: Main Portal Vein size: 1.7 cm Portal Vein Velocities Main Prox:  29.8 cm/sec Main Mid: 35.9 cm/sec Main Dist:  37.1 cm/sec Right: 44.3 cm/sec Left: 19.9 cm/sec Normal hepatopetal flow is noted in the  portal veins. Hepatic Vein Velocities Right:  45.5 cm/sec Middle:  62.7 cm/sec Left:  59.2 cm/sec Normal hepatofugal flow is noted in the patent veins. IVC: Present and patent with normal respiratory phasicity. Hepatic Artery Velocity:  71.9 cm/sec Splenic Vein Velocity:  23.8 cm/sec Spleen: 10 point cm x 15.2 cm x 4.6 cm with a total volume of 368 cm^3 (411 cm^3 is upper limit normal) Portal Vein Occlusion/Thrombus: No Splenic Vein Occlusion/Thrombus: No Ascites: Minimal ascites is noted. Varices: None IMPRESSION: No evidence of hepatic, portal or splenic venous thrombosis or occlusion. Minimal ascites is noted. Electronically Signed   By: Marijo Conception M.D.   On: 01/25/2019 09:42   [place] Elastography Liver  Result Date: 01/25/2019 CLINICAL DATA:  Thrombocytopenia EXAM: US LIVER ELASTOGRAPHY TECHNIQUE: Ultrasound elastography evaluation of the liver was performed. A region of interest was placed in the right lobe of the liver. Following application of a compressive sonographic pulse, shear waves were detected in the adjacent hepatic tissue and the shear wave velocity was calculated. Multiple assessments were performed at the selected site. Median shear wave velocity is correlated to a Metavir fibrosis score. COMPARISON:  CT scan 01/22/2019 FINDINGS: Liver: Irregular liver contour and coarse liver echotexture suggesting cirrhotic changes but no focal hepatic lesions. No intrahepatic biliary dilatation. Small gallstones are noted in the gallbladder without findings for acute cholecystitis. Portal vein is patent on color Doppler imaging with normal direction of blood flow towards the liver. ULTRASOUND HEPATIC ELASTOGRAPHY Device: Siemens Helix VTQ Patient position: Supine Transducer: [id number] Number of measurements: 10 Hepatic segment:  8 Median velocity:   2.28 m/sec IQR: 0.17 IQR/Median velocity ratio: 0.07 Corresponding Metavir fibrosis score:  Some F3 + F4 Risk of fibrosis: High Limitations of exam: None Please note  that abnormal shear wave velocities may also be identified in clinical settings other than with hepatic fibrosis, such as: acute hepatitis, elevated right heart and central venous pressures including use of beta blockers, veno-occlusive disease (Budd-Chiari), infiltrative processes such as mastocytosis/amyloidosis/infiltrative tumor, extrahepatic cholestasis, in the post-prandial state, and liver transplantation. Correlation with patient history, laboratory data, and clinical condition recommended. IMPRESSION: Liver: Cirrhotic appearing changes involving the liver but no focal hepatic lesions. Small gallstones are noted. Elastography: Median hepatic shear wave velocity is calculated at 2.28 m/sec. 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 Risk of fibrosis is High. Follow-up: Follow up advised Electronically Signed   By: Marijo Sanes M.D.   On: 01/25/2019 09:16    Medications: I have reviewed the patient's current medications.  Assessment: Severe recurrent symptomatic anemia, Jehovah's Witness and refusing blood transfusion So far GI work-up has revealed an unremarkable capsule endoscopy, sigmoidoscopy showed dark burgundy stools, no hemorrhoids Unremarkable small bowel push enteroscopy from 12/31/2018 Few submucosal leiomyoma, hyperplastic polyps, few diverticulosis noted on colonoscopy from 11/18/2018 Reactive gastropathy otherwise unremarkable EGD from 11/18/2018  Doppler ultrasound showed no evidence of hepatic, portal or splenic vein thrombosis or occlusion, minimal ascites Ultrasound elastography revealed cirrhotic changes of liver, F3+ F4 metavir fibrosis score History of hepatitis C  Plan: Anemia appears to be multifactorial- end-stage renal disease on hemodialysis, liver fibrosis/cirrhosis,?  history of hepatitis C  No evidence of active GI  bleeding.  Recommend supportive therapy. Patient is on Ferrex titrate 420 mg 3 times daily with meals, ferric gluconate 125 mg IV Monday/  Wednesday/ Friday with hemodialysis, darbepoetin 200 mcg IV every Wednesday with hemodialysis.  We will send HCV viral load, as patient may need treatment if viral load is detected.  Can be addressed as an outpatient.  Will sign off from GI standpoint as patient needs supportive management. Please recall if needed.    Ronnette Juniper 01/25/2019, 1:05 PM   Pager [phone] If no answer or after 5 PM call [phone]

## 2019-01-25 NOTE — Progress Notes (Addendum)
[facility] Progress Note   Subjective: Feels better. Ate all of breakfast. Still DOE if she gets up and walks - using O2 for walking not at bedrest. She "thinks" that maybe she bled from eating sharp pieces of ice.  Explained to here why I didn't think that was liekly.  Objective Vitals:   01/24/19 0604 01/24/19 1302 01/24/19 2249 01/25/19 0614  BP: (!) 152/63 (!) 145/64 (!) 159/70 (!) 143/63  Pulse: 87 89 93 65  Resp:      Temp: 98.6 F (37 C) 98.4 F (36.9 C) 98.4 F (36.9 C) 98 F (36.7 C)  TempSrc: Oral Oral Oral Oral  SpO2: 95% 100% 100% 98%  Weight:      Height:        Physical Exam General: WNWD female  NAD supine in bed - much more engaged than Friday. Heart: RRR  Lungs: CTAB  Abdomen: soft NTND Extremities: tr LE edema  Dialysis Access: LUE AVG +bruit    Additional Objective Labs: Basic Metabolic Panel: Recent Labs  Lab 01/22/19 0342 01/23/19 1010 01/24/19 0457 01/25/19 0457  NA 134* 134* 133* 130*  K 5.5* 6.0* 3.3* 3.4*  CL 93* 91* 93* 92*  CO2 25 25 26 26   GLUCOSE 191* 122* 119* 170*  BUN 69* 82* 25* 37*  CREATININE 5.97* 7.75* 3.84* 5.80*  CALCIUM 8.4* 9.0 9.0 8.9  PHOS 6.5*  --   --   --    CBC: Recent Labs  Lab 01/21/19 0413  01/22/19 0342 01/23/19 1010 01/24/19 0457 01/25/19 0457  WBC 2.1*  --  2.5* 3.6* 3.5* 3.1*  NEUTROABS  --   --   --  2.2 2.4 1.9  HGB 4.2*   < > 4.0* 4.8* 5.2* 4.9*  HCT 13.3*   < > 12.6* 15.2* 16.5* 16.1*  MCV 107.3*  --  108.6* 109.4* 111.5* 111.0*  PLT 102*  --  103* 136* 175 162   < > = values in this interval not displayed.     Medications: . ferric gluconate (FERRLECIT/NULECIT) IV 125 mg (01/23/19 2215)   . allopurinol  200 mg Oral Daily  . atorvastatin  40 mg Oral QHS  . calcitRIOL  1 mcg Oral Q T,Th,Sat-1800  . Chlorhexidine Gluconate Cloth  6 each Topical Q0600  . Chlorhexidine Gluconate Cloth  6 each Topical Q0600  . darbepoetin (ARANESP) injection - DIALYSIS  200 mcg Intravenous  Q Wed-HD  . DULoxetine  60 mg Oral Daily   And  . DULoxetine  30 mg Oral Daily  . ferric citrate  420 mg Oral TID WC  . folic acid  1 mg Oral Daily  . insulin aspart  1-5 Units Subcutaneous TID WC  . insulin glargine  20 Units Subcutaneous QHS  . labetalol  200 mg Oral BID  . multivitamin  1 tablet Oral QHS  . pantoprazole  40 mg Oral BID  . saccharomyces boulardii  250 mg Oral Daily  . senna  2 tablet Oral Daily  . sodium chloride flush  3 mL Intravenous Q12H    GOC TTS  3.5h 400/A 2.0 EDW 72.5kg 2K/2Ca Profile 4 L AVG  No heparin  --Calcitriol 2.0 PO TIW  -- Venofer 50 mg q week  -- Mircera 225 mcg IV q 2 weeks (last dosed 12/03/18)  Recent OP Labs: 5/21: Hgb 10.1 Tsat 46% Ferritin 1067 Ca 10.2 Alb 4.0   iPTH  294  Assessment/Plan: 1. Recurrent GI bleed/symptomatic anemia --Hgb 6.1>4.2> 5.2. Patient  is Jehovah's Witness and has refused blood transfusion this admission.  GI following. Flexible sigmoidoscopy today with no active bleed found. No active bleeding on capsule study. Concern for occult liver disease (Hx Hep C)  as a possible source of bleeding; work up per GI.  - Hopefully lower K and BUN mean bleeding has stopped. 2. Hyperkalemia - Resolved with HD. K 3.3.  3.  ESRD -  TTS. Last HD 6/20 with shorter time, lower BFR/DFR to minimize stress.. Supplemental O2 and F160 if available.  4. Hypertension/volume  - Hypertensive on admission. Improving with HD. Net UF 2L on 6/20.  Avoid low BP with low hgb  5. Anemia  - As above.  Aranesp 200 dosed with HD 6/17. Added supplemental Fe - need to use pediatric tubes to minimize blood loss. 6.  Metabolic bone disease -  Continue VDRA/Fe based binders  7.  Nutrition - Renal diet/vitamins as able - encouraged eating  8.    DM - per primary   9.    Thrombocytopenia - no heparin. Improving.  10.  Anxiety/depression/bipolar d/o  Conley Rolls [facility] Pager [phone] 01/25/2019,10:13 AM  Pt seen, examined and  agree w A/P as above.  Kelly Splinter  MD 01/25/2019, 2:20 PM

## 2019-01-25 NOTE — Progress Notes (Signed)
PROGRESS NOTE  TENEA SENS WNI:[id number] DOB: 03/14/1966 DOA: 01/20/2019 PCP: Patient, [name]   LOS: 5 days   Brief narrative: Patient is a72 year old female with history of ESRD on HD TTS, Jehovah's Witness, combined CHF, DM-2, hepatitis C, PVD status post left TMA, anemia of GI bleed, anxiety/depression/bipolar disorder admitted with symptomatic anemia likely due to hematochezia and melena.  Patient was previously admitted with rectal bleeding and received blood transfusionbutshe regrets accepting blood transfusion at the time  On admission, hemodynamically stable. Hemoglobin 5.6 and patient declined transfusion.Potassium 6.4 received Lokelma. BUN 116. Creatinine 8.57. Nephrology and GI consulted,and patient admitted for further care.  Had HD on 6/17.Hyperkalemia and azotemia resolved.   On 6/18, hemoglobin dropped further to 4.2.She continues torefuseblood transfusionthis time.She had flex sigmoidoscopy that showed dark stool in the rectum and in the sigmoid colon, minimally burgundy-tinged stool and mucosa, but no frank bloodor obvious source of bleeding.Capsule endoscopy negative as well.  Subjective: Patient was seen and examined this morning.   Feels better.  Last bowel movement yesterday, dark and formed.  On oral iron.  Assessment/Plan:  Principal Problem:   GI bleed Active Problems:   Essential hypertension   Chronic pain syndrome   Hyperkalemia   ESRD on dialysis (HCC)   Prolonged QT interval   Acute blood loss anemia  Acute blood loss anemia Symptomatic acute blood loss anemia due to GI bleed (hematochezia and melena) with underlying anemia of chronic disease:Hemoglobin 5.6 on admission and droppedas low as 4. -Patient is Jehovah witness and is refusing transfusion. -Hemodynamically stable. -Hemoglobin at 4.9 this morning. -Flex sigmoidoscopy on 6/18 as above -Capsule endoscopy on 6/18negative as well. -Doppler ultrasound did  not show evidence of hepatic, portal or splenic vein thrombosis or occlusion.  Minimal ascites.  Ultrasound elastography revealed cirrhotic changes of liver. -Continue Protonix twice daily -Anemia panel consistent with anemia of chronic disease. -Aranesp andferric citrate per nephrology  Pancytopenia: suspect this to be due to nutritional deficiency from ongoing GI bleed -Previous hospitalist discussedthiswithDr. Zhao,oncology over the phone who suggested checking TFT and copper level.Both levels normal. -Continue monitoring  ESRD on HD TTS/BMD/azotemia/hyperkalemia:Missed dialysis on 6/16. Had HD on 6/17 -For next dialysis on Tuesday 6/23.  Well-controlled IDDM-2 with renal complication and hypoglycemia: A1c 5.9%. -CBG monitoring -Continue customized SSIto0-5 units  Essential hypertension: Normotensive -Continue home labetalol -Home amlodipine on hold.  Chronic pain: Stable -Continue home Cymbalta and PRN oxycodone.  Anxiety/depression/bipolar disorder: Stable -Continue home meds (Xanax, Cymbalta and trazodone).  Noncompliance:Patient has been refusing towear SCD andcardiac monitor  Body mass index is 27.66 kg/m. Mobility:Encourage ambulation Diet:Renal diet DVT prophylaxis:SCD Code Status:Code Status: Full Code Family Communication: Expected Discharge:Plan to discharge home after dialysis tomorrow if hemoglobin remains stable.  Consultants:  Nephrology  GI  Oncology over the phone  Procedures:  Flex sigmoidoscopy on 6/18  Capsule endoscopy on 6/18  Microbiology:  COVID-19 negative  Antimicrobials:  Anti-infectives (From admission, onward)   None      Infusions:  . ferric gluconate (FERRLECIT/NULECIT) IV 125 mg (01/23/19 2215)    Scheduled Meds: . allopurinol  200 mg Oral Daily  . atorvastatin  40 mg Oral QHS  . calcitRIOL  1 mcg Oral Q T,Th,Sat-1800  . Chlorhexidine Gluconate Cloth  6 each Topical Q0600  .  darbepoetin (ARANESP) injection - DIALYSIS  200 mcg Intravenous Q Wed-HD  . DULoxetine  60 mg Oral Daily   And  . DULoxetine  30 mg Oral Daily  . ferric citrate  420 mg Oral TID WC  . folic acid  1 mg Oral Daily  . insulin aspart  1-5 Units Subcutaneous TID WC  . insulin glargine  20 Units Subcutaneous QHS  . labetalol  200 mg Oral BID  . multivitamin  1 tablet Oral QHS  . pantoprazole  40 mg Oral BID  . saccharomyces boulardii  250 mg Oral Daily  . senna  2 tablet Oral Daily  . sodium chloride flush  3 mL Intravenous Q12H    PRN meds: acetaminophen **OR** acetaminophen, albuterol, ALPRAZolam, diphenhydrAMINE, diphenhydrAMINE-zinc acetate, mupirocin ointment, oxyCODONE   Objective: Vitals:   01/25/19 0614 01/25/19 1353  BP: (!) 143/63 137/62  Pulse: 65 79  Resp:  18  Temp: 98 F (36.7 C) 98.4 F (36.9 C)  SpO2: 98% 98%   No intake or output data in the 24 hours ending 01/25/19 1522 Filed Weights   01/23/19 0644 01/23/19 1931 01/23/19 2247  Weight: 74.7 kg 75.1 kg 72.4 kg   Weight change:  Body mass index is 27.4 kg/m.   Physical Exam: General exam: Appears calm and comfortable.  Skin: No rashes, lesions or ulcers. HEENT: Atraumatic, normocephalic, supple neck, no obvious bleeding Lungs: Clear to auscultation bilaterally CVS: Regular rate and rhythm, no murmur GI/Abd soft, nontender, nondistended, bowel sounds present CNS: Alert, awake, oriented x3 Psychiatry: Mood appropriate Extremities: Trace bilateral pedal edema, no calf tenderness  Data Review: I have personally reviewed the laboratory data and studies available.  Recent Labs  Lab 01/20/19 0741  01/21/19 0413  01/21/19 2313 01/22/19 0342 01/23/19 1010 01/24/19 0457 01/25/19 0457  WBC 3.2*  --  2.1*  --   --  2.5* 3.6* 3.5* 3.1*  NEUTROABS 1.9  --   --   --   --   --  2.2 2.4 1.9  HGB 5.6*   < > 4.2*   < > 5.0* 4.0* 4.8* 5.2* 4.9*  HCT 17.9*   < > 13.3*   < > 15.9* 12.6* 15.2* 16.5* 16.1*  MCV  109.8*  --  107.3*  --   --  108.6* 109.4* 111.5* 111.0*  PLT 124*  --  102*  --   --  103* 136* 175 162   < > = values in this interval not displayed.   Recent Labs  Lab 01/21/19 0413 01/22/19 0342 01/23/19 1010 01/24/19 0457 01/25/19 0457  NA 136 134* 134* 133* 130*  K 4.9 5.5* 6.0* 3.3* 3.4*  CL 95* 93* 91* 93* 92*  CO2 30 25 25 26 26   GLUCOSE 60* 191* 122* 119* 170*  BUN 45* 69* 82* 25* 37*  CREATININE 4.42* 5.97* 7.75* 3.84* 5.80*  CALCIUM 8.1* 8.4* 9.0 9.0 8.9  MG  --  2.3  --   --   --   PHOS  --  6.5*  --   --   --    Terrilee Croak, MD  [facility] 01/25/2019

## 2019-01-26 LAB — CBC WITH DIFFERENTIAL/PLATELET
Abs Immature Granulocytes: 0.03 10*3/uL (ref 0.00–0.07)
Basophils Absolute: 0 10*3/uL (ref 0.0–0.1)
Basophils Relative: 1 %
Eosinophils Absolute: 0.1 10*3/uL (ref 0.0–0.5)
Eosinophils Relative: 3 %
HCT: 17.1 % — ABNORMAL LOW (ref 36.0–46.0)
Hemoglobin: 5.5 g/dL — CL (ref 12.0–15.0)
Immature Granulocytes: 1 %
Lymphocytes Relative: 22 %
Lymphs Abs: 0.7 10*3/uL (ref 0.7–4.0)
MCH: 35.5 pg — ABNORMAL HIGH (ref 26.0–34.0)
MCHC: 32.2 g/dL (ref 30.0–36.0)
MCV: 110.3 fL — ABNORMAL HIGH (ref 80.0–100.0)
Monocytes Absolute: 0.4 10*3/uL (ref 0.1–1.0)
Monocytes Relative: 14 %
Neutro Abs: 2 10*3/uL (ref 1.7–7.7)
Neutrophils Relative %: 59 %
Platelets: 172 10*3/uL (ref 150–400)
RBC: 1.55 MIL/uL — ABNORMAL LOW (ref 3.87–5.11)
RDW: 16.4 % — ABNORMAL HIGH (ref 11.5–15.5)
WBC: 3.3 10*3/uL — ABNORMAL LOW (ref 4.0–10.5)
nRBC: 0.6 % — ABNORMAL HIGH (ref 0.0–0.2)

## 2019-01-26 LAB — BASIC METABOLIC PANEL
Anion gap: 15 (ref 5–15)
BUN: 49 mg/dL — ABNORMAL HIGH (ref 6–20)
CO2: 25 mmol/L (ref 22–32)
Calcium: 8.9 mg/dL (ref 8.9–10.3)
Chloride: 90 mmol/L — ABNORMAL LOW (ref 98–111)
Creatinine, Ser: 7.11 mg/dL — ABNORMAL HIGH (ref 0.44–1.00)
GFR calc Af Amer: 7 mL/min — ABNORMAL LOW (ref >60)
GFR calc non Af Amer: 6 mL/min — ABNORMAL LOW (ref >60)
Glucose, Bld: 197 mg/dL — ABNORMAL HIGH (ref 70–99)
Potassium: 3.7 mmol/L (ref 3.5–5.1)
Sodium: 130 mmol/L — ABNORMAL LOW (ref 135–145)

## 2019-01-26 LAB — GLUCOSE, CAPILLARY
Glucose-Capillary: 197 mg/dL — ABNORMAL HIGH (ref 70–99)
Glucose-Capillary: 167 mg/dL — ABNORMAL HIGH (ref 70–99)

## 2019-01-26 MED ORDER — FERRIC CITRATE 1 GM 210 MG(FE) PO TABS: 420.0000 mg | ORAL_TABLET | Freq: Three times a day (TID) | ORAL | 0 refills | Status: DC

## 2019-01-26 MED ORDER — TRAZODONE HCL 50 MG PO TABS: 25.0000 mg | ORAL_TABLET | Freq: Every day | ORAL | 0 refills | Status: DC

## 2019-01-26 MED ORDER — METHOCARBAMOL 500 MG PO TABS: 500.0000 mg | ORAL_TABLET | Freq: Three times a day (TID) | ORAL | 0 refills | Status: AC | PRN

## 2019-01-26 MED ORDER — HYDROCORTISONE 2.5 % RE CREA: TOPICAL_CREAM | Freq: Two times a day (BID) | RECTAL | 0 refills | Status: DC

## 2019-01-26 MED ORDER — ALLOPURINOL 100 MG PO TABS: 200.0000 mg | ORAL_TABLET | Freq: Every day | ORAL | 0 refills | Status: DC

## 2019-01-26 MED ORDER — OXYCODONE HCL 10 MG PO TABS: 10.0000 mg | ORAL_TABLET | Freq: Three times a day (TID) | ORAL | 0 refills | Status: AC | PRN

## 2019-01-26 MED ORDER — SENNOSIDES 8.6 MG PO TABS: 1.0000 | ORAL_TABLET | Freq: Every day | ORAL | 0 refills | Status: DC

## 2019-01-26 MED ORDER — OXYMETAZOLINE HCL 0.05 % NA SOLN: 2.0000 | Freq: Two times a day (BID) | NASAL | 0 refills | Status: DC

## 2019-01-26 MED ORDER — SACCHAROMYCES BOULARDII 250 MG PO CAPS: 250.0000 mg | ORAL_CAPSULE | Freq: Every day | ORAL | 0 refills | Status: DC

## 2019-01-26 MED ORDER — RENA-VITE PO TABS: 1.0000 | ORAL_TABLET | Freq: Every day | ORAL | 0 refills | Status: DC

## 2019-01-26 MED ORDER — INSULIN ASPART 100 UNIT/ML ~~LOC~~ SOLN: SUBCUTANEOUS | 11 refills | Status: AC

## 2019-01-26 MED ORDER — INSULIN GLARGINE 100 UNIT/ML ~~LOC~~ SOLN: 20.0000 [IU] | Freq: Every day | SUBCUTANEOUS | 0 refills | Status: DC

## 2019-01-26 MED ORDER — DULOXETINE HCL 30 MG PO CPEP: 90.0000 mg | ORAL_CAPSULE | Freq: Every day | ORAL | 0 refills | Status: DC

## 2019-01-26 MED ORDER — DIPHENHYDRAMINE-ZINC ACETATE 2-0.1 % EX CREA: TOPICAL_CREAM | Freq: Three times a day (TID) | CUTANEOUS | 0 refills | Status: AC | PRN

## 2019-01-26 MED ORDER — ALBUTEROL SULFATE HFA 108 (90 BASE) MCG/ACT IN AERS: 2.0000 | INHALATION_SPRAY | RESPIRATORY_TRACT | 0 refills | Status: AC | PRN

## 2019-01-26 MED ORDER — ALPRAZOLAM 0.5 MG PO TABS: 0.5000 mg | ORAL_TABLET | Freq: Three times a day (TID) | ORAL | 0 refills | Status: AC | PRN

## 2019-01-26 MED ORDER — DARBEPOETIN ALFA 200 MCG/0.4ML IJ SOSY: 200.0000 ug | PREFILLED_SYRINGE | INTRAMUSCULAR | Status: AC

## 2019-01-26 MED ORDER — DIPHENHYDRAMINE HCL 25 MG PO CAPS: 25.0000 mg | ORAL_CAPSULE | Freq: Four times a day (QID) | ORAL | 0 refills | Status: DC | PRN

## 2019-01-26 MED ORDER — FOLIC ACID 1 MG PO TABS: 1.0000 mg | ORAL_TABLET | Freq: Every day | ORAL | 0 refills | Status: DC

## 2019-01-26 MED ORDER — DOCUSATE SODIUM 100 MG PO CAPS: 100.0000 mg | ORAL_CAPSULE | Freq: Two times a day (BID) | ORAL | 0 refills | Status: DC

## 2019-01-26 MED ORDER — ONDANSETRON HCL 4 MG PO TABS: 4.0000 mg | ORAL_TABLET | Freq: Three times a day (TID) | ORAL | 0 refills | Status: AC | PRN

## 2019-01-26 MED ORDER — SODIUM CHLORIDE 0.9 % IV SOLN: 125.0000 mg | INTRAVENOUS | Status: DC

## 2019-01-26 MED ORDER — ATORVASTATIN CALCIUM 40 MG PO TABS: 40.0000 mg | ORAL_TABLET | Freq: Every day | ORAL | 0 refills | Status: DC

## 2019-01-26 MED ORDER — LABETALOL HCL 200 MG PO TABS: 200.0000 mg | ORAL_TABLET | Freq: Two times a day (BID) | ORAL | 0 refills | Status: DC

## 2019-01-26 MED ORDER — PANTOPRAZOLE SODIUM 40 MG PO TBEC: 40.0000 mg | DELAYED_RELEASE_TABLET | Freq: Every day | ORAL | 0 refills | Status: DC

## 2019-01-26 NOTE — Discharge Summary (Signed)
Physician Discharge Summary  [name] ERX:[id number] DOB: 1966-07-26 DOA: 01/20/2019  PCP: Patient, [name]  Admit date: 01/20/2019 Discharge date: 01/26/2019  Admitted From: Home Discharge disposition: Home   Code Status: Full Code   Recommendations for Outpatient Follow-Up:   1. Follow-up with PCP as an outpatient 2. Follow-up with GI as an outpatient  Discharge Diagnosis:   Principal Problem:   GI bleed Active Problems:   Essential hypertension   Chronic pain syndrome   Hyperkalemia   ESRD on dialysis (HCC)   Prolonged QT interval   Acute blood loss anemia    History of Present Illness / Brief narrative:  Patient is a15 year old female with history of ESRD on HD TTS, Jehovah's Witness, combined CHF, DM-2, hepatitis C, PVD status post left TMA, anemia of GI bleed, anxiety/depression/bipolar disorder admitted with symptomatic anemia likely due to hematochezia and melena.  Patient was previously admitted with rectal bleeding and received blood transfusionbutshe regrets accepting blood transfusion at the time  On admission, hemodynamically stable. Hemoglobin 5.6 and patient declined transfusion.Potassium 6.4 received Lokelma. BUN 116. Creatinine 8.57. Nephrology and GI consulted,and patient admitted for further care.  Had HD on 6/17.Hyperkalemia and azotemia resolved.   On 6/18, hemoglobin dropped further to 4.2.She continues torefuseblood transfusionthis time.She had flex sigmoidoscopy that showed dark stool in the rectum and in the sigmoid colon, minimally burgundy-tinged stool and mucosa, but no frank bloodor obvious source of bleeding.Capsule endoscopy negative as well.  Hospital Course:  Acute blood loss anemia Symptomatic acute blood loss anemia due to GI bleed (hematochezia and melena) with underlying anemia of chronic disease:Hemoglobin 5.6 on admission and droppedas low as 4.  -Patient is Jehovah witness and is refusing  transfusion. -Hemodynamically stable. -Hemoglobin at 5.5 this morning. -Flex sigmoidoscopy on 6/18 as above -Capsule endoscopy on 6/18negative as well. -Doppler ultrasound 6/22 did not show evidence of hepatic, portal or splenic vein thrombosis or occlusion.  Minimal ascites.  Ultrasound elastography 6/22 revealed cirrhotic changes of liver. -Continue Protonix twice daily -Anemia panel consistent with anemia of chronic disease. -Aranesp andferric citrate with dialysis per nephrology.  Pancytopenia: suspect this to be due to nutritional deficiency from ongoing GI bleed -Previous hospitalist discussedthiswithDr. Zhao,oncology over the phone who suggested checking TFT and copper level.Both levels normal. -Continue monitoring  ESRD on HD /BMD/azotemia/hyperkalemia: - TTS schedule. Last dialysis today.  Well-controlled IDDM-2 with renal complication and hypoglycemia: A1c 5.9%.   Essential hypertension: Normotensive -Continue home labetalol.  Amlodipine has been stopped.  Chronic pain: Stable -Continue home Cymbalta and PRN oxycodone.  Anxiety/depression/bipolar disorder: Stable -Continue home meds (Xanax and Cymbalta).  Noncompliance:Patient has been refusing towear SCD andcardiac monitor  For discharge to home today.  Consultants:  Nephrology  GI  Oncology over the phone  Procedures:  Flex sigmoidoscopy on 6/18  Capsule endoscopy on 6/18  Microbiology:  COVID-19 negative  Subjective:  Patient was seen and examined this morning.  Middle-aged African-American female.  Feels better.  No active bleeding.  She asked for referral to a primary care provider as well as refill of her regular medicines.  Discharge Exam:   Vitals:   01/24/19 2249 01/25/19 0614 01/25/19 1353 01/25/19 2108  BP: (!) 159/70 (!) 143/63 137/62 (!) 151/67  Pulse: 93 65 79 87  Resp:   18   Temp: 98.4 F (36.9 C) 98 F (36.7 C) 98.4 F (36.9 C) 98.6 F (37 C)   TempSrc: Oral Oral Oral Oral  SpO2: 100% 98% 98% 100%  Weight:  Height:        Body mass index is 27.4 kg/m.  General exam: Appears calm and comfortable.  Skin: No rashes, lesions or ulcers. HEENT: Atraumatic, normocephalic, supple neck, no obvious bleeding Lungs: Clear to auscultation bilaterally CVS: Regular rate and rhythm, no murmur GI/Abd soft, nontender, nondistended, bowel sound present CNS: Alert, awake, oriented x3 Psychiatry: Mood appropriate Extremities: Pedal edema trace bilaterally  Discharge Instructions:  Wound care: None Discharge Instructions    Diet - low sodium heart healthy   Complete by: As directed    Increase activity slowly   Complete by: As directed      Follow-up Information    [facility] Follow up on 01/28/2019.   Why:  Hospital follow up visit scheduled for 6/25/202 at 2:30 pm, virtual visit - Juluis Mire NP Contact information: [place] [address] [phone]       Ronald Lobo, MD Follow up.   Specialty: Gastroenterology Contact information: [address] [place] [address] [phone]          Allergies as of 01/26/2019      Reactions   Penicillins Hives, Other (See Comments)   Has patient had a PCN reaction causing immediate rash, facial/tongue/throat swelling, SOB or lightheadedness with hypotension: Yes Has patient had a PCN reaction causing severe rash involving mucus membranes or skin necrosis: No Has patient had a PCN reaction that required hospitalization No Has patient had a PCN reaction occurring within the last 10 years: No If all of the above answers are "NO", then may proceed with Cephalosporin use. Pt tolerates cefepime and ceftriaxone   Gabapentin Itching, Swelling   Lyrica [pregabalin] Swelling, Other (See Comments)   Facial and leg swelling   Saphris [asenapine] Swelling, Other (See Comments)   Facial swelling    Tramadol Swelling, Other (See Comments)   Leg swelling   Adhesive [tape] Itching   SEVERE ITCHING   Latex Itching   SEVERE ITCHING   Other Other (See Comments)   NO BLOOD PRODUCTS; PATIENT IS A JEHOVAH'S WITNESS      Medication List    STOP taking these medications   amLODipine 10 MG tablet Commonly known as: NORVASC   epoetin alfa 20000 UNIT/ML injection Commonly known as: EPOGEN   Ferrous Sulfate 142 (45 Fe) MG Tbcr Commonly known as: Slow Fe   Insulin Detemir 100 UNIT/ML Pen Commonly known as: Levemir FlexTouch     TAKE these medications   albuterol 108 (90 Base) MCG/ACT inhaler Commonly known as: VENTOLIN HFA Inhale 2 puffs into the lungs every 4 (four) hours as needed for wheezing or shortness of breath.   allopurinol 100 MG tablet Commonly known as: ZYLOPRIM Take 2 tablets (200 mg total) by mouth daily for 30 days. Start taking on: January 27, 2019   ALPRAZolam 0.5 MG tablet Commonly known as: XANAX Take 1 tablet (0.5 mg total) by mouth every 8 (eight) hours as needed for up to 5 days for anxiety.   atorvastatin 40 MG tablet Commonly known as: LIPITOR Take 1 tablet (40 mg total) by mouth at bedtime.   calcitRIOL 0.25 MCG capsule Commonly known as: ROCALTROL Take 7 capsules (1.75 mcg total) by mouth every dialysis (3 times per week on TTS). What changed:   how much to take  additional instructions   Darbepoetin Alfa 200 MCG/0.4ML Sosy injection Commonly known as: ARANESP Inject 0.4 mLs (200 mcg total) into the vein every Wednesday with  hemodialysis. Start taking on: January 27, 2019   diphenhydrAMINE 25 mg capsule Commonly known as: BENADRYL Take 1 capsule (25 mg total) by mouth every 6 (six) hours as needed for up to 8 days for itching.   diphenhydrAMINE-zinc acetate cream Commonly known as: BENADRYL Apply topically 3 (three) times daily as needed for up to 7 days for itching.   docusate sodium 100 MG capsule Commonly known as: COLACE Take 1 capsule  (100 mg total) by mouth 2 (two) times daily for 30 days.   DULoxetine 30 MG capsule Commonly known as: CYMBALTA Take 3 capsules (90 mg total) by mouth daily for 30 days. What changed:   how much to take  additional instructions  Another medication with the same name was removed. Continue taking this medication, and follow the directions you see here.   ferric citrate 1 GM 210 MG(Fe) tablet Commonly known as: Auryxia Take 2 tablets (420 mg total) by mouth 3 (three) times daily with meals for 30 days.   ferric gluconate 125 mg in sodium chloride 0.9 % 100 mL Inject 125 mg into the vein Every Tuesday,Thursday,and Saturday with dialysis.   folic acid 1 MG tablet Commonly known as: FOLVITE Take 1 tablet (1 mg total) by mouth daily for 30 days.   hydrocortisone 2.5 % rectal cream Commonly known as: ANUSOL-HC Place rectally 2 (two) times daily.   insulin aspart 100 UNIT/ML injection Commonly known as: novoLOG 0-9 Units, Subcutaneous, 3 times daily with meals CBG < 70: implement hypoglycemia protocol CBG 70 - 120: 0 units CBG 121 - 150: 1 unit CBG 151 - 200: 2 units CBG 201 - 250: 3 units CBG 251 - 300: 5 units CBG 301 - 350: 7 units CBG 351 - 400: 9 units CBG > 400: call MD   insulin glargine 100 UNIT/ML injection Commonly known as: LANTUS Inject 0.2 mLs (20 Units total) into the skin at bedtime for 30 days.   labetalol 200 MG tablet Commonly known as: NORMODYNE Take 1 tablet (200 mg total) by mouth 2 (two) times daily for 30 days.   lidocaine-prilocaine cream Commonly known as: EMLA Apply 1 application topically See admin instructions. On dialysis days   methocarbamol 500 MG tablet Commonly known as: ROBAXIN Take 1 tablet (500 mg total) by mouth 3 (three) times daily as needed for up to 14 days for muscle spasms.   multivitamin Tabs tablet Take 1 tablet by mouth at bedtime for 30 days.   mupirocin ointment 2 % Commonly known as: BACTROBAN Apply 1 application topically  as needed (on arm).   ondansetron 4 MG tablet Commonly known as: ZOFRAN Take 1 tablet (4 mg total) by mouth every 8 (eight) hours as needed for up to 7 days for nausea or vomiting.   Oxycodone HCl 10 MG Tabs Take 1 tablet (10 mg total) by mouth every 8 (eight) hours as needed for up to 5 days (for pain). What changed: when to take this   oxymetazoline 0.05 % nasal spray Commonly known as: AFRIN Place 2 sprays into both nostrils 2 (two) times daily.   pantoprazole 40 MG tablet Commonly known as: PROTONIX Take 1 tablet (40 mg total) by mouth daily at 6 (six) AM for 30 days.   saccharomyces boulardii 250 MG capsule Commonly known as: FLORASTOR Take 1 capsule (250 mg total) by mouth daily for 30 days.   senna 8.6 MG tablet Commonly known as: SENOKOT Take 1 tablet (8.6 mg total) by mouth daily for 30  days. What changed: how much to take   traZODone 50 MG tablet Commonly known as: DESYREL Take 0.5 tablets (25 mg total) by mouth at bedtime for 30 days. What changed: how much to take            Durable Medical Equipment  (From admission, onward)         Start     Ordered   01/22/19 1525  For home use only DME 3 n 1  Once     01/22/19 1524   01/21/19 1501  For home use only DME Walker rolling  Once    Question:  Patient needs a walker to treat with the following condition  Answer:  Weakness   01/21/19 1500          Time coordinating discharge: 35 minutes  The results of significant diagnostics from this hospitalization (including imaging, microbiology, ancillary and laboratory) are listed below for reference.    Procedures and Diagnostic Studies:   No results found.   Labs:   Basic Metabolic Panel: Recent Labs  Lab 01/22/19 0342 01/23/19 1010 01/24/19 0457 01/25/19 0457 01/26/19 0447  NA 134* 134* 133* 130* 130*  K 5.5* 6.0* 3.3* 3.4* 3.7  CL 93* 91* 93* 92* 90*  CO2 25 25 26 26 25   GLUCOSE 191* 122* 119* 170* 197*  BUN 69* 82* 25* 37* 49*   CREATININE 5.97* 7.75* 3.84* 5.80* 7.11*  CALCIUM 8.4* 9.0 9.0 8.9 8.9  MG 2.3  --   --   --   --   PHOS 6.5*  --   --   --   --    GFR Estimated Creatinine Clearance: 9 mL/min (A) (by C-G formula based on SCr of 7.11 mg/dL (H)). Liver Function Tests: Recent Labs  Lab 01/20/19 0741 01/22/19 0342  AST 47*  --   ALT 45*  --   ALKPHOS 133*  --   BILITOT 0.8  --   PROT 6.9  --   ALBUMIN 3.3* 2.8*   No results for input(s): LIPASE, AMYLASE in the last 168 hours. No results for input(s): AMMONIA in the last 168 hours. Coagulation profile No results for input(s): INR, PROTIME in the last 168 hours.  CBC: Recent Labs  Lab 01/20/19 0741  01/22/19 0342 01/23/19 1010 01/24/19 0457 01/25/19 0457 01/26/19 0447  WBC 3.2*   < > 2.5* 3.6* 3.5* 3.1* 3.3*  NEUTROABS 1.9  --   --  2.2 2.4 1.9 2.0  HGB 5.6*   < > 4.0* 4.8* 5.2* 4.9* 5.5*  HCT 17.9*   < > 12.6* 15.2* 16.5* 16.1* 17.1*  MCV 109.8*   < > 108.6* 109.4* 111.5* 111.0* 110.3*  PLT 124*   < > 103* 136* 175 162 172   < > = values in this interval not displayed.   Cardiac Enzymes: No results for input(s): CKTOTAL, CKMB, CKMBINDEX, TROPONINI in the last 168 hours. BNP: Invalid input(s): POCBNP CBG: Recent Labs  Lab 01/25/19 0826 01/25/19 1205 01/25/19 1649 01/25/19 2138 01/26/19 0811  GLUCAP 131* 148* 159* 155* 197*   D-Dimer No results for input(s): DDIMER in the last 72 hours. Hgb A1c No results for input(s): HGBA1C in the last 72 hours. Lipid Profile No results for input(s): CHOL, HDL, LDLCALC, TRIG, CHOLHDL, LDLDIRECT in the last 72 hours. Thyroid function studies No results for input(s): TSH, T4TOTAL, T3FREE, THYROIDAB in the last 72 hours.  Invalid input(s): FREET3 Anemia work up No results for input(s): VITAMINB12, FOLATE, FERRITIN, TIBC,  IRON, RETICCTPCT in the last 72 hours. Microbiology Recent Results (from the past 240 hour(s))  Novel Coronavirus,NAA,(SEND-OUT TO REF LAB - TAT 24-48 hrs); Hosp Order      Status: None   Collection Time: 01/20/19  8:04 AM   Specimen: Oropharyngeal swab; Respiratory  Result Value Ref Range Status   SARS-CoV-2, NAA NOT DETECTED NOT DETECTED Final    Comment: (NOTE) This test was developed and its performance characteristics determined by [employer]. This test has not been FDA cleared or approved. This test has been authorized by FDA under an Emergency Use Authorization (EUA). This test is only authorized for the duration of time the declaration that circumstances exist justifying the authorization of the emergency use of in vitro diagnostic tests for detection of SARS-CoV-2 virus and/or diagnosis of COVID-19 infection under section 564(b)(1) of the Act, 21 U.S.C. 793JQZ-0(S)(9), unless the authorization is terminated or revoked sooner. When diagnostic testing is negative, the possibility of a false negative result should be considered in the context of a patient's recent exposures and the presence of clinical signs and symptoms consistent with COVID-19. An individual without symptoms of COVID-19 and who is not shedding SARS-CoV-2 virus would expect to have a negative (not detected) result in this assay. Performed  At: [facility] [address] [place], [place] [id number] Rush Farmer MD QJ:[id number]    [place]  Final    Comment: Performed at [facility], [place] [address] [place], [place] [address]  MRSA PCR Screening     Status: Abnormal   Collection Time: 01/20/19 11:28 AM   Specimen: Nasal Mucosa; Nasopharyngeal  Result Value Ref Range Status   MRSA by PCR POSITIVE (A) NEGATIVE Final    Comment:        The GeneXpert MRSA Assay (FDA approved for NASAL specimens only), is one component of a comprehensive MRSA colonization surveillance program. It is not intended to diagnose MRSA infection nor to guide or monitor treatment for MRSA infections. RESULT CALLED TO, READ BACK BY AND VERIFIED  WITH: Precious Bard RN 13:15 01/20/19 ([email or web address]) Performed at [facility], [place] [address], [place], [place] [address]     Signed: Terrilee Croak  [facility] 01/26/2019, 10:22 AM

## 2019-01-26 NOTE — Sepsis Progress Note (Signed)
Patient is discharged home on home health, discharge instruction given to patient, medication educattion done, patient is informed to pick up medications from [facility]. Patient verbalized understanding.

## 2019-01-26 NOTE — Progress Notes (Addendum)
Update: 11:48am-Patient not discharging to dialysis today. She will discharge home today and go to dialysis tomorrow at [facility] per renal navigator.   1130am-CSW provided taxi voucher for patient to dc to dialysis.  Percell Locus Jarl Sellitto LCSW [phone]

## 2019-01-26 NOTE — Care Management Important Message (Signed)
Important Message  Patient Details  Name: [name] MRN: [id number] Date of Birth: August 24, 1965   Medicare Important Message Given:  Yes     [name] Montine Circle 01/26/2019, 9:32 AM

## 2019-01-26 NOTE — TOC Transition Note (Addendum)
Transition of Care [facility]) - CM/SW Discharge Note   Patient Details  Name: [name] MRN: [id number] Date of Birth: 02/02/1966  Transition of Care [facility]) CM/SW Contact:  Sharin Mons, RN Phone Number: 01/26/2019, 12:11 PM   Clinical Narrative:   Admitted with rectal bleeding.  Pt will transition to home today. Outpt dialysis scheduled for tomorrow @ 6 am. Pt has transportation services setup with [employer] for outpt dialysis sessions.Resumption of home health services arranged with [place] ( RN,PT,SLP). Rolling walker, BSC/3 in 1 will be delivered to bedside prior to discharge. Pt active with the CAPS program Clancy Gourd RN, [place] / CAP liaisoncalled per NCM and made aware of d/c plan, [phone]. Pt states son to provide transportation to home.   Final next level of care: [place] Barriers to Discharge: No Barriers Identified   Patient Goals and CMS Choice Patient states their goals for this hospitalization and ongoing recovery are:: My goal is to not come here! CMS Medicare.gov Compare Post Acute Care list provided to:: Patient Choice offered to / list presented to : Patient(pt active with [facility] and would like to resume services with [facility])  Discharge Placement                       Discharge Plan and Services                DME Arranged: 3-N-1, Walker rolling DME Agency: [employer] Date DME Agency Contacted: 01/26/19 Time DME Agency Contacted: [id number] Representative spoke with at DME Agency: [place]: RN, PT, Speech Therapy [place] Agency: [place] ([place]) Date HH Agency Contacted: 01/26/19 Time [place]: 1211 Representative spoke with at [place]: [place] ([place]) Interventions     Readmission Risk Interventions Readmission Risk Prevention Plan 01/21/2019 11/17/2018  Transportation Screening Complete -  Medication Review Press photographer) Complete -  PCP or  Specialist appointment within 3-5 days of discharge Complete -  [place] or [place] Complete -  SW Recovery Care/Counseling Consult - Complete  Palliative Care Screening Not Applicable -  [place] Not Applicable -  Some recent data might be hidden

## 2019-01-26 NOTE — Plan of Care (Signed)
  Problem: Education: Goal: Knowledge of General Education information will improve Description: Including pain rating scale, medication(s)/side effects and non-pharmacologic comfort measures 01/26/2019 1133 by Linton Flemings, RN Outcome: Adequate for Discharge 01/26/2019 1133 by Linton Flemings, RN Outcome: Adequate for Discharge   Problem: Health Behavior/Discharge Planning: Goal: Ability to manage health-related needs will improve 01/26/2019 1133 by Linton Flemings, RN Outcome: Adequate for Discharge 01/26/2019 1133 by Linton Flemings, RN Outcome: Adequate for Discharge   Problem: Clinical Measurements: Goal: Ability to maintain clinical measurements within normal limits will improve 01/26/2019 1133 by Linton Flemings, RN Outcome: Adequate for Discharge 01/26/2019 1133 by Linton Flemings, RN Outcome: Adequate for Discharge Goal: Will remain free from infection 01/26/2019 1133 by Linton Flemings, RN Outcome: Adequate for Discharge 01/26/2019 1133 by Linton Flemings, RN Outcome: Adequate for Discharge Goal: Diagnostic test results will improve 01/26/2019 1133 by Linton Flemings, RN Outcome: Adequate for Discharge 01/26/2019 1133 by Linton Flemings, RN Outcome: Adequate for Discharge Goal: Respiratory complications will improve 01/26/2019 1133 by Linton Flemings, RN Outcome: Adequate for Discharge 01/26/2019 1133 by Linton Flemings, RN Outcome: Adequate for Discharge Goal: Cardiovascular complication will be avoided 01/26/2019 1133 by Linton Flemings, RN Outcome: Adequate for Discharge 01/26/2019 1133 by Linton Flemings, RN Outcome: Adequate for Discharge   Problem: Activity: Goal: Risk for activity intolerance will decrease 01/26/2019 1133 by Linton Flemings, RN Outcome: Adequate for Discharge 01/26/2019 1133 by Linton Flemings, RN Outcome: Adequate for Discharge   Problem: Nutrition: Goal: Adequate nutrition will be maintained 01/26/2019 1133 by Linton Flemings, RN Outcome:  Adequate for Discharge 01/26/2019 1133 by Linton Flemings, RN Outcome: Adequate for Discharge   Problem: Coping: Goal: Level of anxiety will decrease Outcome: Adequate for Discharge   Problem: Elimination: Goal: Will not experience complications related to bowel motility 01/26/2019 1133 by Linton Flemings, RN Outcome: Adequate for Discharge 01/26/2019 1133 by Linton Flemings, RN Outcome: Adequate for Discharge Goal: Will not experience complications related to urinary retention 01/26/2019 1133 by Linton Flemings, RN Outcome: Adequate for Discharge 01/26/2019 1133 by Linton Flemings, RN Outcome: Adequate for Discharge   Problem: Pain Managment: Goal: General experience of comfort will improve Outcome: Adequate for Discharge   Problem: Safety: Goal: Ability to remain free from injury will improve Outcome: Adequate for Discharge   Problem: Skin Integrity: Goal: Risk for impaired skin integrity will decrease Outcome: Adequate for Discharge

## 2019-01-26 NOTE — Progress Notes (Signed)
Renal Navigator received call from Inpt HD unit stating patient is scheduled for discharge today and also scheduled for dialysis. Renal Navigator spoke with Charge RN at patient's OP HD clinic/[facility] who states they will make a seat for her at 12:20pm and she will need to arrive at 12:00pm.  Renal Navigator notes discharge summary in chart, but no discharge order. Renal Navigator contacted Dr. Pietro Cassis who states he will put order in now. Renal Navigator attempted to contact CSW/N. [place] who was not available. Renal Navigator contacted CM/A. Landry Mellow to request cab voucher. Renal Navigator contacted patient to discuss plan and she was adamant that she would not go straight from hospital to OP HD today. She requests seat be scheduled at OP HD clinic tomorrow and she will forego HD today. Renal Navigator spoke with Renal PA/[name] to see if patient was safe to discharge today without HD and [name] confirmed that she is. Renal Navigator contacted Emilie Rutter and canceled her seat for today and got her a seat for tomorrow, 01/27/19 at 6am. Renal Navigator canceled cab voucher with [facility] team. Renal Navigator contacted patient back with new seat time for tomorrow. She was greatly appreciative and agreeable to plan.  [name], [place] Renal Navigator  [phone]

## 2019-01-26 NOTE — Plan of Care (Signed)
  Problem: Education: Goal: Knowledge of General Education information will improve Description: Including pain rating scale, medication(s)/side effects and non-pharmacologic comfort measures 01/26/2019 1221 by Linton Flemings, RN Outcome: Completed/Met 01/26/2019 1133 by Linton Flemings, RN Outcome: Adequate for Discharge 01/26/2019 1133 by Linton Flemings, RN Outcome: Adequate for Discharge   Problem: Health Behavior/Discharge Planning: Goal: Ability to manage health-related needs will improve 01/26/2019 1221 by Linton Flemings, RN Outcome: Completed/Met 01/26/2019 1133 by Linton Flemings, RN Outcome: Adequate for Discharge 01/26/2019 1133 by Linton Flemings, RN Outcome: Adequate for Discharge   Problem: Clinical Measurements: Goal: Ability to maintain clinical measurements within normal limits will improve 01/26/2019 1221 by Linton Flemings, RN Outcome: Completed/Met 01/26/2019 1133 by Linton Flemings, RN Outcome: Adequate for Discharge 01/26/2019 1133 by Linton Flemings, RN Outcome: Adequate for Discharge Goal: Will remain free from infection 01/26/2019 1221 by Linton Flemings, RN Outcome: Completed/Met 01/26/2019 1133 by Linton Flemings, RN Outcome: Adequate for Discharge 01/26/2019 1133 by Linton Flemings, RN Outcome: Adequate for Discharge Goal: Diagnostic test results will improve 01/26/2019 1221 by Linton Flemings, RN Outcome: Completed/Met 01/26/2019 1133 by Linton Flemings, RN Outcome: Adequate for Discharge 01/26/2019 1133 by Linton Flemings, RN Outcome: Adequate for Discharge Goal: Respiratory complications will improve 01/26/2019 1221 by Linton Flemings, RN Outcome: Completed/Met 01/26/2019 1133 by Linton Flemings, RN Outcome: Adequate for Discharge 01/26/2019 1133 by Linton Flemings, RN Outcome: Adequate for Discharge Goal: Cardiovascular complication will be avoided 01/26/2019 1221 by Linton Flemings, RN Outcome: Completed/Met 01/26/2019 1133 by Linton Flemings,  RN Outcome: Adequate for Discharge 01/26/2019 1133 by Linton Flemings, RN Outcome: Adequate for Discharge   Problem: Activity: Goal: Risk for activity intolerance will decrease 01/26/2019 1221 by Linton Flemings, RN Outcome: Completed/Met 01/26/2019 1133 by Linton Flemings, RN Outcome: Adequate for Discharge 01/26/2019 1133 by Linton Flemings, RN Outcome: Adequate for Discharge   Problem: Nutrition: Goal: Adequate nutrition will be maintained 01/26/2019 1221 by Linton Flemings, RN Outcome: Completed/Met 01/26/2019 1133 by Linton Flemings, RN Outcome: Adequate for Discharge 01/26/2019 1133 by Linton Flemings, RN Outcome: Adequate for Discharge   Problem: Coping: Goal: Level of anxiety will decrease 01/26/2019 1221 by Linton Flemings, RN Outcome: Completed/Met 01/26/2019 1133 by Linton Flemings, RN Outcome: Adequate for Discharge   Problem: Elimination: Goal: Will not experience complications related to bowel motility 01/26/2019 1221 by Linton Flemings, RN Outcome: Completed/Met 01/26/2019 1133 by Linton Flemings, RN Outcome: Adequate for Discharge 01/26/2019 1133 by Linton Flemings, RN Outcome: Adequate for Discharge Goal: Will not experience complications related to urinary retention 01/26/2019 1221 by Linton Flemings, RN Outcome: Completed/Met 01/26/2019 1133 by Linton Flemings, RN Outcome: Adequate for Discharge 01/26/2019 1133 by Linton Flemings, RN Outcome: Adequate for Discharge   Problem: Pain Managment: Goal: General experience of comfort will improve 01/26/2019 1221 by Linton Flemings, RN Outcome: Completed/Met 01/26/2019 1133 by Linton Flemings, RN Outcome: Adequate for Discharge   Problem: Safety: Goal: Ability to remain free from injury will improve 01/26/2019 1221 by Linton Flemings, RN Outcome: Completed/Met 01/26/2019 1133 by Linton Flemings, RN Outcome: Adequate for Discharge   Problem: Skin Integrity: Goal: Risk for impaired skin integrity will  decrease 01/26/2019 1221 by Linton Flemings, RN Outcome: Completed/Met 01/26/2019 1133 by Linton Flemings, RN Outcome: Adequate for Discharge

## 2019-01-27 LAB — HCV RT-PCR, QUANT (NON-GRAPH)
HCV log10: 6.747 log10 IU/mL
Hepatitis C Quantitation: 5590000 IU/mL

## 2019-01-27 LAB — HCV AB W REFLEX TO QUANT PCR: HCV Ab: >11 s/co ratio — ABNORMAL HIGH (ref 0.0–0.9)

## 2019-01-28 ENCOUNTER — Other Ambulatory Visit: Payer: Self-pay

## 2019-01-28 ENCOUNTER — Ambulatory Visit: Payer: Medicare Other | Attending: Primary Care | Admitting: Primary Care

## 2019-01-30 LAB — HCV RNA QUANT RFLX ULTRA OR GENOTYP
HCV RNA Qnt(log copy/mL): 6.747 log10 IU/mL
HepC Qn: 5590000 IU/mL

## 2019-01-30 LAB — HEPATITIS C GENOTYPE

## 2019-02-24 ENCOUNTER — Other Ambulatory Visit: Payer: Self-pay

## 2019-02-24 ENCOUNTER — Emergency Department (HOSPITAL_COMMUNITY): Payer: Medicare Other

## 2019-02-24 ENCOUNTER — Inpatient Hospital Stay (HOSPITAL_COMMUNITY)
Admission: EM | Admit: 2019-02-24 | Discharge: 2019-02-26 | DRG: 640 | Disposition: A | Discharge status: Home Health | Payer: Medicare Other | Attending: Family Medicine | Admitting: Family Medicine

## 2019-02-24 DIAGNOSIS — R778 Other specified abnormalities of plasma proteins: Secondary | ICD-10-CM | POA: Diagnosis present

## 2019-02-24 DIAGNOSIS — E8770 Fluid overload, unspecified: Secondary | ICD-10-CM | POA: Diagnosis present

## 2019-02-24 DIAGNOSIS — H409 Unspecified glaucoma: Secondary | ICD-10-CM | POA: Diagnosis present

## 2019-02-24 DIAGNOSIS — I5042 Chronic combined systolic (congestive) and diastolic (congestive) heart failure: Secondary | ICD-10-CM | POA: Diagnosis present

## 2019-02-24 DIAGNOSIS — F259 Schizoaffective disorder, unspecified: Secondary | ICD-10-CM | POA: Diagnosis present

## 2019-02-24 DIAGNOSIS — Z79899 Other long term (current) drug therapy: Secondary | ICD-10-CM

## 2019-02-24 DIAGNOSIS — Z885 Allergy status to narcotic agent status: Secondary | ICD-10-CM

## 2019-02-24 DIAGNOSIS — Z8673 Personal history of transient ischemic attack (TIA), and cerebral infarction without residual deficits: Secondary | ICD-10-CM

## 2019-02-24 DIAGNOSIS — R079 Chest pain, unspecified: Secondary | ICD-10-CM | POA: Diagnosis present

## 2019-02-24 DIAGNOSIS — E875 Hyperkalemia: Principal | ICD-10-CM | POA: Diagnosis present

## 2019-02-24 DIAGNOSIS — Z794 Long term (current) use of insulin: Secondary | ICD-10-CM

## 2019-02-24 DIAGNOSIS — Z888 Allergy status to other drugs, medicaments and biological substances status: Secondary | ICD-10-CM

## 2019-02-24 DIAGNOSIS — F418 Other specified anxiety disorders: Secondary | ICD-10-CM | POA: Diagnosis present

## 2019-02-24 DIAGNOSIS — G894 Chronic pain syndrome: Secondary | ICD-10-CM | POA: Diagnosis present

## 2019-02-24 DIAGNOSIS — I10 Essential (primary) hypertension: Secondary | ICD-10-CM | POA: Diagnosis present

## 2019-02-24 DIAGNOSIS — Z20828 Contact with and (suspected) exposure to other viral communicable diseases: Secondary | ICD-10-CM | POA: Diagnosis present

## 2019-02-24 DIAGNOSIS — Z8614 Personal history of Methicillin resistant Staphylococcus aureus infection: Secondary | ICD-10-CM

## 2019-02-24 DIAGNOSIS — Z880 Allergy status to penicillin: Secondary | ICD-10-CM

## 2019-02-24 DIAGNOSIS — E1122 Type 2 diabetes mellitus with diabetic chronic kidney disease: Secondary | ICD-10-CM | POA: Diagnosis present

## 2019-02-24 DIAGNOSIS — I132 Hypertensive heart and chronic kidney disease with heart failure and with stage 5 chronic kidney disease, or end stage renal disease: Secondary | ICD-10-CM | POA: Diagnosis present

## 2019-02-24 DIAGNOSIS — K5900 Constipation, unspecified: Secondary | ICD-10-CM | POA: Diagnosis present

## 2019-02-24 DIAGNOSIS — G459 Transient cerebral ischemic attack, unspecified: Secondary | ICD-10-CM | POA: Diagnosis present

## 2019-02-24 DIAGNOSIS — Z833 Family history of diabetes mellitus: Secondary | ICD-10-CM

## 2019-02-24 DIAGNOSIS — Z89432 Acquired absence of left foot: Secondary | ICD-10-CM

## 2019-02-24 DIAGNOSIS — F319 Bipolar disorder, unspecified: Secondary | ICD-10-CM | POA: Diagnosis present

## 2019-02-24 DIAGNOSIS — Z90710 Acquired absence of both cervix and uterus: Secondary | ICD-10-CM

## 2019-02-24 DIAGNOSIS — E1140 Type 2 diabetes mellitus with diabetic neuropathy, unspecified: Secondary | ICD-10-CM | POA: Diagnosis present

## 2019-02-24 DIAGNOSIS — Z8249 Family history of ischemic heart disease and other diseases of the circulatory system: Secondary | ICD-10-CM

## 2019-02-24 DIAGNOSIS — F17210 Nicotine dependence, cigarettes, uncomplicated: Secondary | ICD-10-CM | POA: Diagnosis present

## 2019-02-24 DIAGNOSIS — N189 Chronic kidney disease, unspecified: Secondary | ICD-10-CM | POA: Diagnosis present

## 2019-02-24 DIAGNOSIS — K219 Gastro-esophageal reflux disease without esophagitis: Secondary | ICD-10-CM | POA: Diagnosis present

## 2019-02-24 DIAGNOSIS — Z9115 Patient's noncompliance with renal dialysis: Secondary | ICD-10-CM

## 2019-02-24 DIAGNOSIS — N19 Unspecified kidney failure: Secondary | ICD-10-CM | POA: Diagnosis present

## 2019-02-24 DIAGNOSIS — E1129 Type 2 diabetes mellitus with other diabetic kidney complication: Secondary | ICD-10-CM | POA: Diagnosis present

## 2019-02-24 DIAGNOSIS — I160 Hypertensive urgency: Secondary | ICD-10-CM | POA: Diagnosis present

## 2019-02-24 DIAGNOSIS — D61818 Other pancytopenia: Secondary | ICD-10-CM | POA: Diagnosis present

## 2019-02-24 DIAGNOSIS — D631 Anemia in chronic kidney disease: Secondary | ICD-10-CM | POA: Diagnosis present

## 2019-02-24 DIAGNOSIS — M109 Gout, unspecified: Secondary | ICD-10-CM | POA: Diagnosis present

## 2019-02-24 DIAGNOSIS — E8889 Other specified metabolic disorders: Secondary | ICD-10-CM | POA: Diagnosis present

## 2019-02-24 DIAGNOSIS — E785 Hyperlipidemia, unspecified: Secondary | ICD-10-CM | POA: Diagnosis present

## 2019-02-24 DIAGNOSIS — I248 Other forms of acute ischemic heart disease: Secondary | ICD-10-CM | POA: Diagnosis present

## 2019-02-24 DIAGNOSIS — Z720 Tobacco use: Secondary | ICD-10-CM | POA: Diagnosis present

## 2019-02-24 DIAGNOSIS — Z91040 Latex allergy status: Secondary | ICD-10-CM

## 2019-02-24 DIAGNOSIS — Z992 Dependence on renal dialysis: Secondary | ICD-10-CM

## 2019-02-24 DIAGNOSIS — N186 End stage renal disease: Secondary | ICD-10-CM | POA: Diagnosis present

## 2019-02-24 LAB — CBC WITH DIFFERENTIAL/PLATELET
Abs Immature Granulocytes: 0.01 10*3/uL (ref 0.00–0.07)
Basophils Absolute: 0 10*3/uL (ref 0.0–0.1)
Basophils Relative: 0 %
Eosinophils Absolute: 0.1 10*3/uL (ref 0.0–0.5)
Eosinophils Relative: 2 %
HCT: 27.9 % — ABNORMAL LOW (ref 36.0–46.0)
Hemoglobin: 8.7 g/dL — ABNORMAL LOW (ref 12.0–15.0)
Immature Granulocytes: 0 %
Lymphocytes Relative: 17 %
Lymphs Abs: 0.5 10*3/uL — ABNORMAL LOW (ref 0.7–4.0)
MCH: 34 pg (ref 26.0–34.0)
MCHC: 31.2 g/dL (ref 30.0–36.0)
MCV: 109 fL — ABNORMAL HIGH (ref 80.0–100.0)
Monocytes Absolute: 0.2 10*3/uL (ref 0.1–1.0)
Monocytes Relative: 8 %
Neutro Abs: 2 10*3/uL (ref 1.7–7.7)
Neutrophils Relative %: 73 %
Platelets: 141 10*3/uL — ABNORMAL LOW (ref 150–400)
RBC: 2.56 MIL/uL — ABNORMAL LOW (ref 3.87–5.11)
RDW: 16.3 % — ABNORMAL HIGH (ref 11.5–15.5)
WBC: 2.8 10*3/uL — ABNORMAL LOW (ref 4.0–10.5)
nRBC: 0 % (ref 0.0–0.2)

## 2019-02-24 LAB — COMPREHENSIVE METABOLIC PANEL
ALT: 39 U/L (ref 0–44)
AST: 37 U/L (ref 15–41)
Albumin: 4 g/dL (ref 3.5–5.0)
Alkaline Phosphatase: 126 U/L (ref 38–126)
Anion gap: 21 — ABNORMAL HIGH (ref 5–15)
BUN: 95 mg/dL — ABNORMAL HIGH (ref 6–20)
CO2: 19 mmol/L — ABNORMAL LOW (ref 22–32)
Calcium: 9.3 mg/dL (ref 8.9–10.3)
Chloride: 91 mmol/L — ABNORMAL LOW (ref 98–111)
Creatinine, Ser: 9.54 mg/dL — ABNORMAL HIGH (ref 0.44–1.00)
GFR calc Af Amer: 5 mL/min — ABNORMAL LOW (ref >60)
GFR calc non Af Amer: 4 mL/min — ABNORMAL LOW (ref >60)
Glucose, Bld: 141 mg/dL — ABNORMAL HIGH (ref 70–99)
Potassium: >7.5 mmol/L (ref 3.5–5.1)
Sodium: 131 mmol/L — ABNORMAL LOW (ref 135–145)
Total Bilirubin: 1.1 mg/dL (ref 0.3–1.2)
Total Protein: 8.4 g/dL — ABNORMAL HIGH (ref 6.5–8.1)

## 2019-02-24 NOTE — ED Triage Notes (Signed)
Dialysis patient coming from home via [facility] c/o SOB/chest pain. Has admitted to missing recent dialysis and a prior one she cant quite remember.

## 2019-02-25 ENCOUNTER — Encounter (HOSPITAL_COMMUNITY): Payer: Self-pay | Admitting: General Practice

## 2019-02-25 DIAGNOSIS — N19 Unspecified kidney failure: Secondary | ICD-10-CM

## 2019-02-25 DIAGNOSIS — Z720 Tobacco use: Secondary | ICD-10-CM

## 2019-02-25 DIAGNOSIS — R7989 Other specified abnormal findings of blood chemistry: Secondary | ICD-10-CM

## 2019-02-25 DIAGNOSIS — R079 Chest pain, unspecified: Secondary | ICD-10-CM | POA: Diagnosis not present

## 2019-02-25 DIAGNOSIS — K219 Gastro-esophageal reflux disease without esophagitis: Secondary | ICD-10-CM | POA: Diagnosis present

## 2019-02-25 DIAGNOSIS — F418 Other specified anxiety disorders: Secondary | ICD-10-CM | POA: Diagnosis present

## 2019-02-25 DIAGNOSIS — I248 Other forms of acute ischemic heart disease: Secondary | ICD-10-CM | POA: Diagnosis present

## 2019-02-25 DIAGNOSIS — D631 Anemia in chronic kidney disease: Secondary | ICD-10-CM

## 2019-02-25 DIAGNOSIS — I132 Hypertensive heart and chronic kidney disease with heart failure and with stage 5 chronic kidney disease, or end stage renal disease: Secondary | ICD-10-CM | POA: Diagnosis present

## 2019-02-25 DIAGNOSIS — N186 End stage renal disease: Secondary | ICD-10-CM

## 2019-02-25 DIAGNOSIS — E1140 Type 2 diabetes mellitus with diabetic neuropathy, unspecified: Secondary | ICD-10-CM | POA: Diagnosis present

## 2019-02-25 DIAGNOSIS — Z20828 Contact with and (suspected) exposure to other viral communicable diseases: Secondary | ICD-10-CM | POA: Diagnosis present

## 2019-02-25 DIAGNOSIS — G894 Chronic pain syndrome: Secondary | ICD-10-CM | POA: Diagnosis present

## 2019-02-25 DIAGNOSIS — F259 Schizoaffective disorder, unspecified: Secondary | ICD-10-CM | POA: Diagnosis present

## 2019-02-25 DIAGNOSIS — E785 Hyperlipidemia, unspecified: Secondary | ICD-10-CM | POA: Diagnosis present

## 2019-02-25 DIAGNOSIS — E1122 Type 2 diabetes mellitus with diabetic chronic kidney disease: Secondary | ICD-10-CM | POA: Diagnosis present

## 2019-02-25 DIAGNOSIS — H409 Unspecified glaucoma: Secondary | ICD-10-CM | POA: Diagnosis present

## 2019-02-25 DIAGNOSIS — I10 Essential (primary) hypertension: Secondary | ICD-10-CM

## 2019-02-25 DIAGNOSIS — E1129 Type 2 diabetes mellitus with other diabetic kidney complication: Secondary | ICD-10-CM | POA: Diagnosis present

## 2019-02-25 DIAGNOSIS — G459 Transient cerebral ischemic attack, unspecified: Secondary | ICD-10-CM

## 2019-02-25 DIAGNOSIS — E8770 Fluid overload, unspecified: Secondary | ICD-10-CM | POA: Diagnosis present

## 2019-02-25 DIAGNOSIS — I160 Hypertensive urgency: Secondary | ICD-10-CM | POA: Diagnosis present

## 2019-02-25 DIAGNOSIS — Z9115 Patient's noncompliance with renal dialysis: Secondary | ICD-10-CM | POA: Diagnosis not present

## 2019-02-25 DIAGNOSIS — F319 Bipolar disorder, unspecified: Secondary | ICD-10-CM | POA: Diagnosis present

## 2019-02-25 DIAGNOSIS — M109 Gout, unspecified: Secondary | ICD-10-CM | POA: Diagnosis present

## 2019-02-25 DIAGNOSIS — I5042 Chronic combined systolic (congestive) and diastolic (congestive) heart failure: Secondary | ICD-10-CM

## 2019-02-25 DIAGNOSIS — E8889 Other specified metabolic disorders: Secondary | ICD-10-CM | POA: Diagnosis present

## 2019-02-25 DIAGNOSIS — F17210 Nicotine dependence, cigarettes, uncomplicated: Secondary | ICD-10-CM | POA: Diagnosis present

## 2019-02-25 DIAGNOSIS — Z8673 Personal history of transient ischemic attack (TIA), and cerebral infarction without residual deficits: Secondary | ICD-10-CM | POA: Diagnosis not present

## 2019-02-25 DIAGNOSIS — Z992 Dependence on renal dialysis: Secondary | ICD-10-CM

## 2019-02-25 DIAGNOSIS — E875 Hyperkalemia: Secondary | ICD-10-CM | POA: Diagnosis present

## 2019-02-25 DIAGNOSIS — K5900 Constipation, unspecified: Secondary | ICD-10-CM | POA: Diagnosis present

## 2019-02-25 DIAGNOSIS — D61818 Other pancytopenia: Secondary | ICD-10-CM | POA: Diagnosis present

## 2019-02-25 LAB — CBC
HCT: 24.1 % — ABNORMAL LOW (ref 36.0–46.0)
HCT: 24.8 % — ABNORMAL LOW (ref 36.0–46.0)
Hemoglobin: 7.8 g/dL — ABNORMAL LOW (ref 12.0–15.0)
Hemoglobin: 8.1 g/dL — ABNORMAL LOW (ref 12.0–15.0)
MCH: 33.9 pg (ref 26.0–34.0)
MCH: 34.2 pg — ABNORMAL HIGH (ref 26.0–34.0)
MCHC: 32.4 g/dL (ref 30.0–36.0)
MCHC: 32.7 g/dL (ref 30.0–36.0)
MCV: 104.8 fL — ABNORMAL HIGH (ref 80.0–100.0)
MCV: 104.6 fL — ABNORMAL HIGH (ref 80.0–100.0)
Platelets: 136 10*3/uL — ABNORMAL LOW (ref 150–400)
Platelets: 156 10*3/uL (ref 150–400)
RBC: 2.3 MIL/uL — ABNORMAL LOW (ref 3.87–5.11)
RBC: 2.37 MIL/uL — ABNORMAL LOW (ref 3.87–5.11)
RDW: 16.2 % — ABNORMAL HIGH (ref 11.5–15.5)
RDW: 16.3 % — ABNORMAL HIGH (ref 11.5–15.5)
WBC: 2.1 10*3/uL — ABNORMAL LOW (ref 4.0–10.5)
WBC: 2.2 10*3/uL — ABNORMAL LOW (ref 4.0–10.5)
nRBC: 0 % (ref 0.0–0.2)
nRBC: 0 % (ref 0.0–0.2)

## 2019-02-25 LAB — RENAL FUNCTION PANEL
Albumin: 3.4 g/dL — ABNORMAL LOW (ref 3.5–5.0)
Anion gap: 16 — ABNORMAL HIGH (ref 5–15)
BUN: 33 mg/dL — ABNORMAL HIGH (ref 6–20)
CO2: 25 mmol/L (ref 22–32)
Calcium: 9.1 mg/dL (ref 8.9–10.3)
Chloride: 94 mmol/L — ABNORMAL LOW (ref 98–111)
Creatinine, Ser: 4.93 mg/dL — ABNORMAL HIGH (ref 0.44–1.00)
GFR calc Af Amer: 11 mL/min — ABNORMAL LOW (ref >60)
GFR calc non Af Amer: 9 mL/min — ABNORMAL LOW (ref >60)
Glucose, Bld: 178 mg/dL — ABNORMAL HIGH (ref 70–99)
Phosphorus: 4 mg/dL (ref 2.5–4.6)
Potassium: 5.2 mmol/L — ABNORMAL HIGH (ref 3.5–5.1)
Sodium: 135 mmol/L (ref 135–145)

## 2019-02-25 LAB — POCT I-STAT, CHEM 8
BUN: 32 mg/dL — ABNORMAL HIGH (ref 6–20)
Calcium, Ion: 1.13 mmol/L — ABNORMAL LOW (ref 1.15–1.40)
Chloride: 97 mmol/L — ABNORMAL LOW (ref 98–111)
Creatinine, Ser: 4.4 mg/dL — ABNORMAL HIGH (ref 0.44–1.00)
Glucose, Bld: 113 mg/dL — ABNORMAL HIGH (ref 70–99)
HCT: 26 % — ABNORMAL LOW (ref 36.0–46.0)
Hemoglobin: 8.8 g/dL — ABNORMAL LOW (ref 12.0–15.0)
Potassium: 3.9 mmol/L (ref 3.5–5.1)
Sodium: 134 mmol/L — ABNORMAL LOW (ref 135–145)
TCO2: 26 mmol/L (ref 22–32)

## 2019-02-25 LAB — COMPREHENSIVE METABOLIC PANEL
ALT: 37 U/L (ref 0–44)
AST: 35 U/L (ref 15–41)
Albumin: 3.7 g/dL (ref 3.5–5.0)
Alkaline Phosphatase: 111 U/L (ref 38–126)
Anion gap: 18 — ABNORMAL HIGH (ref 5–15)
BUN: 25 mg/dL — ABNORMAL HIGH (ref 6–20)
CO2: 24 mmol/L (ref 22–32)
Calcium: 8.5 mg/dL — ABNORMAL LOW (ref 8.9–10.3)
Chloride: 93 mmol/L — ABNORMAL LOW (ref 98–111)
Creatinine, Ser: 3.53 mg/dL — ABNORMAL HIGH (ref 0.44–1.00)
GFR calc Af Amer: 16 mL/min — ABNORMAL LOW (ref >60)
GFR calc non Af Amer: 14 mL/min — ABNORMAL LOW (ref >60)
Glucose, Bld: 125 mg/dL — ABNORMAL HIGH (ref 70–99)
Potassium: 3.8 mmol/L (ref 3.5–5.1)
Sodium: 135 mmol/L (ref 135–145)
Total Bilirubin: 0.9 mg/dL (ref 0.3–1.2)
Total Protein: 7.4 g/dL (ref 6.5–8.1)

## 2019-02-25 LAB — GLUCOSE, CAPILLARY
Glucose-Capillary: 142 mg/dL — ABNORMAL HIGH (ref 70–99)
Glucose-Capillary: 125 mg/dL — ABNORMAL HIGH (ref 70–99)
Glucose-Capillary: 170 mg/dL — ABNORMAL HIGH (ref 70–99)
Glucose-Capillary: 207 mg/dL — ABNORMAL HIGH (ref 70–99)
Glucose-Capillary: 208 mg/dL — ABNORMAL HIGH (ref 70–99)

## 2019-02-25 LAB — LIPID PANEL
Cholesterol: 122 mg/dL (ref 0–200)
HDL: 78 mg/dL (ref >40)
LDL Cholesterol: 29 mg/dL (ref 0–99)
Total CHOL/HDL Ratio: 1.6 RATIO
Triglycerides: 76 mg/dL (ref <150)
VLDL: 15 mg/dL (ref 0–40)

## 2019-02-25 LAB — PHOSPHORUS: Phosphorus: 2.3 mg/dL — ABNORMAL LOW (ref 2.5–4.6)

## 2019-02-25 LAB — BASIC METABOLIC PANEL
Anion gap: 18 — ABNORMAL HIGH (ref 5–15)
BUN: 27 mg/dL — ABNORMAL HIGH (ref 6–20)
CO2: 24 mmol/L (ref 22–32)
Calcium: 9.3 mg/dL (ref 8.9–10.3)
Chloride: 92 mmol/L — ABNORMAL LOW (ref 98–111)
Creatinine, Ser: 4.09 mg/dL — ABNORMAL HIGH (ref 0.44–1.00)
GFR calc Af Amer: 14 mL/min — ABNORMAL LOW (ref >60)
GFR calc non Af Amer: 12 mL/min — ABNORMAL LOW (ref >60)
Glucose, Bld: 161 mg/dL — ABNORMAL HIGH (ref 70–99)
Potassium: 4 mmol/L (ref 3.5–5.1)
Sodium: 134 mmol/L — ABNORMAL LOW (ref 135–145)

## 2019-02-25 LAB — HEMOGLOBIN A1C
Hgb A1c MFr Bld: 5.6 % (ref 4.8–5.6)
Mean Plasma Glucose: 114.02 mg/dL

## 2019-02-25 LAB — TROPONIN I (HIGH SENSITIVITY)
Troponin I (High Sensitivity): 27 ng/L — ABNORMAL HIGH (ref <18)
Troponin I (High Sensitivity): 27 ng/L — ABNORMAL HIGH (ref <18)

## 2019-02-25 LAB — CBG MONITORING, ED: Glucose-Capillary: 99 mg/dL (ref 70–99)

## 2019-02-25 LAB — HEPATITIS B SURFACE ANTIGEN: Hepatitis B Surface Ag: NEGATIVE

## 2019-02-25 LAB — SARS CORONAVIRUS 2 BY RT PCR (HOSPITAL ORDER, PERFORMED IN ~~LOC~~ HOSPITAL LAB): SARS Coronavirus 2: NEGATIVE

## 2019-02-25 MED ORDER — ACETAMINOPHEN 325 MG PO TABS
650.0000 mg | ORAL_TABLET | Freq: Four times a day (QID) | ORAL | Status: DC | PRN
  Administered 2019-02-25: 650 mg via ORAL
  Filled 2019-02-25: qty 2

## 2019-02-25 MED ORDER — HEPARIN SODIUM (PORCINE) 1000 UNIT/ML DIALYSIS: 1000.0000 [IU] | INTRAMUSCULAR | Status: DC | PRN

## 2019-02-25 MED ORDER — SODIUM BICARBONATE 8.4 % IV SOLN
50.0000 meq | Freq: Once | INTRAVENOUS | Status: AC
  Administered 2019-02-25: 02:00:00 50 meq via INTRAVENOUS
  Filled 2019-02-25: qty 50

## 2019-02-25 MED ORDER — HYDROXYZINE HCL 25 MG PO TABS: 25.0000 mg | ORAL_TABLET | Freq: Three times a day (TID) | ORAL | Status: DC | PRN

## 2019-02-25 MED ORDER — PENTAFLUOROPROP-TETRAFLUOROETH EX AERO: 1.0000 "application " | INHALATION_SPRAY | CUTANEOUS | Status: DC | PRN

## 2019-02-25 MED ORDER — FERRIC CITRATE 1 GM 210 MG(FE) PO TABS
420.0000 mg | ORAL_TABLET | Freq: Three times a day (TID) | ORAL | Status: DC
  Administered 2019-02-25 – 2019-02-26 (×4): 420 mg via ORAL
  Filled 2019-02-25 (×6): qty 2

## 2019-02-25 MED ORDER — TRAZODONE HCL 50 MG PO TABS
50.0000 mg | ORAL_TABLET | Freq: Every day | ORAL | Status: DC
  Administered 2019-02-25: 50 mg via ORAL
  Filled 2019-02-25: qty 1

## 2019-02-25 MED ORDER — INSULIN ASPART 100 UNIT/ML IV SOLN
5.0000 [IU] | Freq: Once | INTRAVENOUS | Status: AC
  Administered 2019-02-25: 01:00:00 5 [IU] via INTRAVENOUS

## 2019-02-25 MED ORDER — LIDOCAINE HCL (PF) 1 % IJ SOLN: 5.0000 mL | INTRAMUSCULAR | Status: DC | PRN

## 2019-02-25 MED ORDER — DM-GUAIFENESIN ER 30-600 MG PO TB12: 1.0000 | ORAL_TABLET | Freq: Two times a day (BID) | ORAL | Status: DC | PRN

## 2019-02-25 MED ORDER — ALBUTEROL SULFATE (2.5 MG/3ML) 0.083% IN NEBU: 2.5000 mg | INHALATION_SOLUTION | RESPIRATORY_TRACT | Status: DC | PRN

## 2019-02-25 MED ORDER — HYDRALAZINE HCL 20 MG/ML IJ SOLN
INTRAMUSCULAR | Status: AC
  Administered 2019-02-25: 5 mg via INTRAVENOUS
  Filled 2019-02-25: qty 1

## 2019-02-25 MED ORDER — INSULIN GLARGINE 100 UNIT/ML ~~LOC~~ SOLN
15.0000 [IU] | Freq: Every day | SUBCUTANEOUS | Status: DC
  Administered 2019-02-25: 15 [IU] via SUBCUTANEOUS
  Filled 2019-02-25 (×2): qty 0.15

## 2019-02-25 MED ORDER — HYDROXYZINE HCL 25 MG PO TABS
25.0000 mg | ORAL_TABLET | Freq: Once | ORAL | Status: AC
  Administered 2019-02-25: 25 mg via ORAL
  Filled 2019-02-25: qty 1

## 2019-02-25 MED ORDER — TRAZODONE HCL 50 MG PO TABS: 25.0000 mg | ORAL_TABLET | Freq: Every day | ORAL | Status: DC

## 2019-02-25 MED ORDER — HEPARIN SODIUM (PORCINE) 5000 UNIT/ML IJ SOLN
5000.0000 [IU] | Freq: Three times a day (TID) | INTRAMUSCULAR | Status: DC
  Administered 2019-02-25 – 2019-02-26 (×4): 5000 [IU] via SUBCUTANEOUS
  Filled 2019-02-25 (×4): qty 1

## 2019-02-25 MED ORDER — LIDOCAINE-PRILOCAINE 2.5-2.5 % EX CREA: 1.0000 "application " | TOPICAL_CREAM | CUTANEOUS | Status: DC | PRN

## 2019-02-25 MED ORDER — PANTOPRAZOLE SODIUM 40 MG PO TBEC
40.0000 mg | DELAYED_RELEASE_TABLET | Freq: Every day | ORAL | Status: DC
  Administered 2019-02-25 – 2019-02-26 (×2): 40 mg via ORAL
  Filled 2019-02-25 (×2): qty 1

## 2019-02-25 MED ORDER — HYDRALAZINE HCL 20 MG/ML IJ SOLN: 5.0000 mg | INTRAMUSCULAR | Status: DC | PRN

## 2019-02-25 MED ORDER — DULOXETINE HCL 60 MG PO CPEP
90.0000 mg | ORAL_CAPSULE | Freq: Every day | ORAL | Status: DC
  Administered 2019-02-25 – 2019-02-26 (×2): 90 mg via ORAL
  Filled 2019-02-25 (×2): qty 1

## 2019-02-25 MED ORDER — SODIUM CHLORIDE 0.9 % IV SOLN: 100.0000 mL | INTRAVENOUS | Status: DC | PRN

## 2019-02-25 MED ORDER — NICOTINE 21 MG/24HR TD PT24
21.0000 mg | MEDICATED_PATCH | Freq: Every day | TRANSDERMAL | Status: DC
  Administered 2019-02-25 – 2019-02-26 (×2): 21 mg via TRANSDERMAL
  Filled 2019-02-25 (×2): qty 1

## 2019-02-25 MED ORDER — DEXTROSE 50 % IV SOLN
1.0000 | Freq: Once | INTRAVENOUS | Status: AC
  Administered 2019-02-25: 50 mL via INTRAVENOUS
  Filled 2019-02-25: qty 50

## 2019-02-25 MED ORDER — RENA-VITE PO TABS
1.0000 | ORAL_TABLET | Freq: Every day | ORAL | Status: DC
  Administered 2019-02-25: 21:00:00 1 via ORAL
  Filled 2019-02-25: qty 1

## 2019-02-25 MED ORDER — SODIUM CHLORIDE 0.9 % IV SOLN
1.0000 g | Freq: Once | INTRAVENOUS | Status: AC
  Administered 2019-02-25: 01:00:00 1 g via INTRAVENOUS
  Filled 2019-02-25: qty 10

## 2019-02-25 MED ORDER — OXYMETAZOLINE HCL 0.05 % NA SOLN
2.0000 | Freq: Two times a day (BID) | NASAL | Status: DC
  Administered 2019-02-25 – 2019-02-26 (×3): 2 via NASAL
  Filled 2019-02-25: qty 30

## 2019-02-25 MED ORDER — NITROGLYCERIN 0.4 MG SL SUBL: 0.4000 mg | SUBLINGUAL_TABLET | SUBLINGUAL | Status: DC | PRN

## 2019-02-25 MED ORDER — INSULIN ASPART 100 UNIT/ML ~~LOC~~ SOLN
0.0000 [IU] | Freq: Three times a day (TID) | SUBCUTANEOUS | Status: DC
  Administered 2019-02-25: 1 [IU] via SUBCUTANEOUS
  Administered 2019-02-25 – 2019-02-26 (×2): 2 [IU] via SUBCUTANEOUS

## 2019-02-25 MED ORDER — CHLORHEXIDINE GLUCONATE CLOTH 2 % EX PADS
6.0000 | MEDICATED_PAD | Freq: Every day | CUTANEOUS | Status: DC
  Administered 2019-02-25 – 2019-02-26 (×2): 6 via TOPICAL

## 2019-02-25 MED ORDER — HYDRALAZINE HCL 20 MG/ML IJ SOLN
5.0000 mg | Freq: Once | INTRAMUSCULAR | Status: AC
  Administered 2019-02-25: 05:00:00 5 mg via INTRAVENOUS

## 2019-02-25 MED ORDER — ALTEPLASE 2 MG IJ SOLR: 2.0000 mg | Freq: Once | INTRAMUSCULAR | Status: DC | PRN

## 2019-02-25 MED ORDER — SACCHAROMYCES BOULARDII 250 MG PO CAPS
250.0000 mg | ORAL_CAPSULE | Freq: Every day | ORAL | Status: DC
  Administered 2019-02-25 – 2019-02-26 (×2): 250 mg via ORAL
  Filled 2019-02-25 (×2): qty 1

## 2019-02-25 MED ORDER — LABETALOL HCL 200 MG PO TABS
200.0000 mg | ORAL_TABLET | Freq: Two times a day (BID) | ORAL | Status: DC
  Administered 2019-02-25 – 2019-02-26 (×3): 200 mg via ORAL
  Filled 2019-02-25 (×3): qty 1

## 2019-02-25 MED ORDER — ALLOPURINOL 100 MG PO TABS
200.0000 mg | ORAL_TABLET | Freq: Every day | ORAL | Status: DC
  Administered 2019-02-25 – 2019-02-26 (×2): 200 mg via ORAL
  Filled 2019-02-25 (×2): qty 2

## 2019-02-25 MED ORDER — FOLIC ACID 1 MG PO TABS
1.0000 mg | ORAL_TABLET | Freq: Every day | ORAL | Status: DC
  Administered 2019-02-25 – 2019-02-26 (×2): 1 mg via ORAL
  Filled 2019-02-25 (×2): qty 1

## 2019-02-25 MED ORDER — CALCITRIOL 0.5 MCG PO CAPS: 1.0000 ug | ORAL_CAPSULE | ORAL | Status: DC | PRN

## 2019-02-25 MED ORDER — POLYETHYLENE GLYCOL 3350 17 G PO PACK
17.0000 g | PACK | Freq: Every day | ORAL | Status: DC
  Administered 2019-02-26: 17 g via ORAL
  Filled 2019-02-25 (×2): qty 1

## 2019-02-25 MED ORDER — HYDROCORTISONE 2.5 % RE CREA: 1.0000 "application " | TOPICAL_CREAM | Freq: Two times a day (BID) | RECTAL | Status: DC

## 2019-02-25 MED ORDER — ATORVASTATIN CALCIUM 40 MG PO TABS
40.0000 mg | ORAL_TABLET | Freq: Every day | ORAL | Status: DC
  Administered 2019-02-25: 40 mg via ORAL
  Filled 2019-02-25: qty 1

## 2019-02-25 MED ORDER — SENNOSIDES-DOCUSATE SODIUM 8.6-50 MG PO TABS: 1.0000 | ORAL_TABLET | Freq: Every evening | ORAL | Status: DC | PRN

## 2019-02-25 NOTE — Progress Notes (Signed)
Renal Navigator notified [facility] HD clinic/[facility] of patient's admission and negative COVID 19 rapid test result to provide continuity of care and safety.  Alphonzo Cruise, [place] Renal Navigator [phone]

## 2019-02-25 NOTE — H&P (Addendum)
History and Physical    [name] WUX:[id number] DOB: [birth year] DOA: 02/24/2019  Referring MD/NP/PA:   PCP: Rocco Serene, MD   Patient coming from:  The patient is coming from home.  At baseline, pt is independent for most of ADL.        Chief Complaint: SOB and chest pain  HPI: [name] is a 53 y.o. female with medical history significant of ESDR-HD (TTS), hypertension, hyperlipidemia, diabetes mellitus, TIA, GERD, gout, depression, anxiety, bipolar disorder, HCV, anemia, GI bleeding, polysubstance abuse, tobacco abuse, CHF with EF of 45%, who presents with shortness breath and chest pain.  Pt states that she missed her dialysis yesterday,  possibly missed 2 sessions of dialysis.  She developed shortness of breath and chest pain which has been going on for 2 days.  The chest pain is located in the central chest, pressure-like, 9 out of 10 in severity, nonradiating.  She has cough with greenish colored mucus production.  No fever or chills.  Patient denies nausea, vomiting, diarrhea or abdominal pain.  She is constipated.  No symptoms of UTI or unilateral weakness.  ED Course: pt was found to have troponin 27, pancytopenia (WBC 2.8, hemoglobin 8.7, platelets of 141), negative COVID-19 test, potassium >7.5 with T wave peaking on EKG, bicarbonate 19, creatinine 9.54, BUN 95, temperature normal, blood pressure 185/101, heart rate 84, RR 22, oxygen saturation 100% on room air.  Chest x-ray showed vascular congestion.  Patient is placed on telemetry bed for observation.  Dr. Carolin Sicks of nephrology was consulted for urgent dialysis.  Review of Systems:   General: no fevers, chills, no body weight gain, has fatigue HEENT: no blurry vision, hearing changes or sore throat Respiratory: has dyspnea, coughing, no wheezing CV: has chest pain, no palpitations GI: no nausea, vomiting, abdominal pain, diarrhea, has constipation GU: no dysuria, burning on urination, increased urinary frequency,  hematuria  Ext: no leg edema Neuro: no unilateral weakness, numbness, or tingling, no vision change or hearing loss Skin: no rash, no skin tear. MSK: No muscle spasm, no deformity, no limitation of range of movement in spin Heme: No easy bruising.  Travel history: No recent long distant travel.  Allergy:  Allergies  Allergen Reactions  . Penicillins Hives and Other (See Comments)    Has patient had a PCN reaction causing immediate rash, facial/tongue/throat swelling, SOB or lightheadedness with hypotension: Yes Has patient had a PCN reaction causing severe rash involving mucus membranes or skin necrosis: No Has patient had a PCN reaction that required hospitalization No Has patient had a PCN reaction occurring within the last 10 years: No If all of the above answers are "NO", then may proceed with Cephalosporin use. Pt tolerates cefepime and ceftriaxone  . Gabapentin Itching and Swelling  . Lyrica [Pregabalin] Swelling and Other (See Comments)    Facial and leg swelling  . Saphris [Asenapine] Swelling and Other (See Comments)    Facial swelling  . Tramadol Swelling and Other (See Comments)    Leg swelling  . Adhesive [Tape] Itching    SEVERE ITCHING  . Latex Itching    SEVERE ITCHING  . Other Other (See Comments)    NO BLOOD PRODUCTS; PATIENT IS A JEHOVAH'S WITNESS    Past Medical History:  Diagnosis Date  . Anemia   . Anxiety   . Bipolar disorder, unspecified ([place]) 02/06/2014  . Chronic combined systolic and diastolic CHF (congestive heart failure) (HCC)    EF 40% by echo and 48%  by stress test  . Chronic pain syndrome   . Depression   . ESRD on hemodialysis (HCC)    TTS  . GERD (gastroesophageal reflux disease)   . Glaucoma   . Headache    sinus headaches  . Hepatitis C   . High cholesterol   . History of hiatal hernia   . History of vitamin D deficiency 06/20/2015  . Hypertension   . MRSA infection ?2014   "on the back of my head; spread throughout my  bloodstream"  . Neuropathy   . Refusal of blood transfusions as patient is Jehovah's Witness   . Schizoaffective disorder, unspecified condition 02/08/2014  . Seizure [facility]) dx'd 2014   "don't know what kind; last one was ~ 04/2014"  . TIA (transient ischemic attack) 2010  . Type II diabetes mellitus ([place])    INSULIN DEPENDENT    Past Surgical History:  Procedure Laterality Date  . ABDOMINAL HYSTERECTOMY  2014  . ABDOMINAL HYSTERECTOMY  2010  . AMPUTATION Left 05/21/2013   Procedure: Left Midfoot AMPUTATION;  Surgeon: Newt Minion, MD;  Location: [place];  Service: Orthopedics;  Laterality: Left;  Left Midfoot Amputation  . AV FISTULA PLACEMENT Left 05/04/2015   Procedure: ARTERIOVENOUS (AV) GRAFT INSERTION;  Surgeon: Angelia Mould, MD;  Location: [place];  Service: Vascular;  Laterality: Left;  . BIOPSY  11/18/2018   Procedure: BIOPSY;  Surgeon: Jerene Bears, MD;  Location: [facility] ENDOSCOPY;  Service: Gastroenterology;;  . COLONOSCOPY WITH PROPOFOL N/A 06/22/2013   Procedure: COLONOSCOPY WITH PROPOFOL;  Surgeon: Jeryl Columbia, MD;  Location: [facility] ENDOSCOPY;  Service: Endoscopy;  Laterality: N/A;  . COLONOSCOPY WITH PROPOFOL N/A 11/18/2018   Procedure: COLONOSCOPY WITH PROPOFOL;  Surgeon: Jerene Bears, MD;  Location: [place];  Service: Gastroenterology;  Laterality: N/A;  . ENTEROSCOPY N/A 12/31/2018   Procedure: ENTEROSCOPY;  Surgeon: Irene Shipper, MD;  Location: [facility] ENDOSCOPY;  Service: Endoscopy;  Laterality: N/A;  Push enteroscopy at 8am (spoke with Endo)  . ESOPHAGOGASTRODUODENOSCOPY N/A 05/11/2015   Procedure: ESOPHAGOGASTRODUODENOSCOPY (EGD);  Surgeon: Laurence Spates, MD;  Location: [facility] ENDOSCOPY;  Service: Endoscopy;  Laterality: N/A;  . ESOPHAGOGASTRODUODENOSCOPY (EGD) WITH PROPOFOL N/A 11/18/2018   Procedure: ESOPHAGOGASTRODUODENOSCOPY (EGD) WITH PROPOFOL;  Surgeon: Jerene Bears, MD;  Location: [facility] ENDOSCOPY;  Service: Gastroenterology;  Laterality: N/A;  . EYE SURGERY Bilateral  2010   Lasik  . FLEXIBLE SIGMOIDOSCOPY N/A 01/17/2017   Procedure: FLEXIBLE SIGMOIDOSCOPY w/ hemorrhoid banding possible;  Surgeon: Gatha Mayer, MD;  Location: [facility] ENDOSCOPY;  Service: Endoscopy;  Laterality: N/A;  . FLEXIBLE SIGMOIDOSCOPY N/A 01/21/2019   Procedure: FLEXIBLE SIGMOIDOSCOPY;  Surgeon: Ronald Lobo, MD;  Location: [facility] ENDOSCOPY;  Service: Endoscopy;  Laterality: N/A;  Plan to do the test unprepped, unsedated  . FOOT AMPUTATION Left    due diabetic neuropathy, could not feel ulcer on bottom of foot  . GIVENS CAPSULE STUDY N/A 01/21/2019   Procedure: GIVENS CAPSULE STUDY;  Surgeon: Ronald Lobo, MD;  Location: [facility] ENDOSCOPY;  Service: Endoscopy;  Laterality: N/A;  . POLYPECTOMY  11/18/2018   Procedure: POLYPECTOMY;  Surgeon: Jerene Bears, MD;  Location: [facility] ENDOSCOPY;  Service: Gastroenterology;;  . REFRACTIVE SURGERY Bilateral 2013  . TONSILLECTOMY  1970's  . TUBAL LIGATION  2000    Social History:  reports that she has been smoking cigarettes. She has a 36.00 pack-year smoking history. She has never used smokeless tobacco. She reports current alcohol use of about 3.0 standard drinks of alcohol per  week. She reports previous drug use. Drugs: "Crack" cocaine and Cocaine.  Family History:  Family History  Problem Relation Age of Onset  . Hypertension Mother   . Diabetes Mother   . Hypertension Father   . Diabetes Father      Prior to Admission medications   Medication Sig Start Date End Date Taking? Authorizing Provider  albuterol (VENTOLIN HFA) 108 (90 Base) MCG/ACT inhaler Inhale 2 puffs into the lungs every 4 (four) hours as needed for wheezing or shortness of breath. 01/26/19   Dahal, Marlowe Aschoff, MD  allopurinol (ZYLOPRIM) 100 MG tablet Take 2 tablets (200 mg total) by mouth daily for 30 days. 01/27/19 02/26/19  Terrilee Croak, MD  atorvastatin (LIPITOR) 40 MG tablet Take 1 tablet (40 mg total) by mouth at bedtime. 01/26/19   Terrilee Croak, MD  calcitRIOL (ROCALTROL) 0.25  MCG capsule Take 7 capsules (1.75 mcg total) by mouth every dialysis (3 times per week on TTS). Patient taking differently: Take 1 mcg by mouth every dialysis (3 times per week on TTS). Taking 4 capsules (0.51mcg) = 66mcg 10/10/18   Ghimire, Henreitta Leber, MD  Darbepoetin Alfa (ARANESP) 200 MCG/0.4ML SOSY injection Inject 0.4 mLs (200 mcg total) into the vein every Wednesday with hemodialysis. 01/27/19   Terrilee Croak, MD  diphenhydrAMINE (BENADRYL) 25 mg capsule Take 1 capsule (25 mg total) by mouth every 6 (six) hours as needed for up to 8 days for itching. 01/26/19 02/03/19  Dahal, Marlowe Aschoff, MD  docusate sodium (COLACE) 100 MG capsule Take 1 capsule (100 mg total) by mouth 2 (two) times daily for 30 days. 01/26/19 02/25/19  Terrilee Croak, MD  DULoxetine (CYMBALTA) 30 MG capsule Take 3 capsules (90 mg total) by mouth daily for 30 days. 01/26/19 02/25/19  Terrilee Croak, MD  ferric citrate (AURYXIA) 1 GM 210 MG(Fe) tablet Take 2 tablets (420 mg total) by mouth 3 (three) times daily with meals for 30 days. 01/26/19 02/25/19  Terrilee Croak, MD  ferric gluconate 125 mg in sodium chloride 0.9 % 100 mL Inject 125 mg into the vein Every Tuesday,Thursday,and Saturday with dialysis. 01/26/19   Terrilee Croak, MD  folic acid (FOLVITE) 1 MG tablet Take 1 tablet (1 mg total) by mouth daily for 30 days. 01/26/19 02/25/19  Terrilee Croak, MD  hydrocortisone (ANUSOL-HC) 2.5 % rectal cream Place rectally 2 (two) times daily. 01/26/19   Dahal, Marlowe Aschoff, MD  insulin aspart (NOVOLOG) 100 UNIT/ML injection 0-9 Units, Subcutaneous, 3 times daily with meals CBG < 70: implement hypoglycemia protocol CBG 70 - 120: 0 units CBG 121 - 150: 1 unit CBG 151 - 200: 2 units CBG 201 - 250: 3 units CBG 251 - 300: 5 units CBG 301 - 350: 7 units CBG 351 - 400: 9 units CBG > 400: call MD 01/26/19   Terrilee Croak, MD  insulin glargine (LANTUS) 100 UNIT/ML injection Inject 0.2 mLs (20 Units total) into the skin at bedtime for 30 days. 01/26/19 02/25/19  Terrilee Croak, MD   labetalol (NORMODYNE) 200 MG tablet Take 1 tablet (200 mg total) by mouth 2 (two) times daily for 30 days. 01/26/19 02/25/19  Terrilee Croak, MD  lidocaine-prilocaine (EMLA) cream Apply 1 application topically See admin instructions. On dialysis days 12/23/18   [provider]  multivitamin (RENA-VIT) TABS tablet Take 1 tablet by mouth at bedtime for 30 days. 01/26/19 02/25/19  Terrilee Croak, MD  mupirocin ointment (BACTROBAN) 2 % Apply 1 application topically as needed (on arm).  12/01/18   [provider]  oxymetazoline (AFRIN) 0.05 % nasal spray Place 2 sprays into both nostrils 2 (two) times daily. 01/26/19   Terrilee Croak, MD  pantoprazole (PROTONIX) 40 MG tablet Take 1 tablet (40 mg total) by mouth daily at 6 (six) AM for 30 days. 01/26/19 02/25/19  Terrilee Croak, MD  saccharomyces boulardii (FLORASTOR) 250 MG capsule Take 1 capsule (250 mg total) by mouth daily for 30 days. 01/26/19 02/25/19  Terrilee Croak, MD  senna (SENOKOT) 8.6 MG tablet Take 1 tablet (8.6 mg total) by mouth daily for 30 days. 01/26/19 02/25/19  Terrilee Croak, MD  traZODone (DESYREL) 50 MG tablet Take 0.5 tablets (25 mg total) by mouth at bedtime for 30 days. 01/26/19 02/25/19  Terrilee Croak, MD    Physical Exam: Vitals:   02/25/19 0242 02/25/19 0300 02/25/19 0330 02/25/19 0400  BP:  (!) 186/100 (!) 198/104 (!) 178/88  Pulse: 87 90 95 91  Resp:  20 (!) 23 (!) 23  Temp:      TempSrc:      SpO2:       General: Not in acute distress HEENT:       Eyes: PERRL, EOMI, no scleral icterus.       ENT: No discharge from the ears and nose, no pharynx injection, no tonsillar enlargement.        Neck: No JVD, no bruit, no mass felt. Heme: No neck lymph node enlargement. Cardiac: S1/S2, RRR, No murmurs, No gallops or rubs. Respiratory: No rales, wheezing, rhonchi or rubs. GI: Soft, nondistended, nontender, no rebound pain, no organomegaly, BS present. GU: No hematuria Ext: No pitting leg edema bilaterally. 2+DP/PT pulse  bilaterally. Musculoskeletal: No joint deformities, No joint redness or warmth, no limitation of ROM in spin. Skin: No rashes.  Neuro: Alert, oriented X3, cranial nerves II-XII grossly intact, moves all extremities normally. Psych: Patient is not psychotic, no suicidal or hemocidal ideation.  Labs on Admission: I have personally reviewed following labs and imaging studies  CBC: Recent Labs  Lab 02/24/19 2238  WBC 2.8*  NEUTROABS 2.0  HGB 8.7*  HCT 27.9*  MCV 109.0*  PLT 762*   Basic Metabolic Panel: Recent Labs  Lab 02/24/19 2238  NA 131*  K >7.5*  CL 91*  CO2 19*  GLUCOSE 141*  BUN 95*  CREATININE 9.54*  CALCIUM 9.3   GFR: CrCl cannot be calculated (Unknown ideal weight.). Liver Function Tests: Recent Labs  Lab 02/24/19 2238  AST 37  ALT 39  ALKPHOS 126  BILITOT 1.1  PROT 8.4*  ALBUMIN 4.0   No results for input(s): LIPASE, AMYLASE in the last 168 hours. No results for input(s): AMMONIA in the last 168 hours. Coagulation Profile: No results for input(s): INR, PROTIME in the last 168 hours. Cardiac Enzymes: No results for input(s): CKTOTAL, CKMB, CKMBINDEX, TROPONINI in the last 168 hours. BNP (last 3 results) No results for input(s): PROBNP in the last 8760 hours. HbA1C: No results for input(s): HGBA1C in the last 72 hours. CBG: Recent Labs  Lab 02/25/19 0040  GLUCAP 99   Lipid Profile: No results for input(s): CHOL, HDL, LDLCALC, TRIG, CHOLHDL, LDLDIRECT in the last 72 hours. Thyroid Function Tests: No results for input(s): TSH, T4TOTAL, FREET4, T3FREE, THYROIDAB in the last 72 hours. Anemia Panel: No results for input(s): VITAMINB12, FOLATE, FERRITIN, TIBC, IRON, RETICCTPCT in the last 72 hours. Urine analysis:    Component Value Date/Time   COLORURINE YELLOW 02/25/2017 1224   APPEARANCEUR HAZY (A) 02/25/2017 1224   LABSPEC  1.013 02/25/2017 1224   PHURINE 8.0 02/25/2017 1224   GLUCOSEU >=500 (A) 02/25/2017 1224   HGBUR SMALL (A) 02/25/2017  1224   BILIRUBINUR NEGATIVE 02/25/2017 1224   BILIRUBINUR neg 12/13/2014 1127   KETONESUR NEGATIVE 02/25/2017 1224   PROTEINUR >=300 (A) 02/25/2017 1224   UROBILINOGEN 0.2 05/01/2015 1252   NITRITE NEGATIVE 02/25/2017 1224   LEUKOCYTESUR NEGATIVE 02/25/2017 1224   Sepsis Labs: @LABRCNTIP (procalcitonin:4,lacticidven:4) ) Recent Results (from the past 240 hour(s))  SARS Coronavirus 2 (CEPHEID - Performed in [place] hospital lab), Hosp Order     Status: None   Collection Time: 02/25/19 12:11 AM   Specimen: Nasopharyngeal Swab  Result Value Ref Range Status   SARS Coronavirus 2 NEGATIVE NEGATIVE Final    Comment: (NOTE) If result is NEGATIVE SARS-CoV-2 target nucleic acids are NOT DETECTED. The SARS-CoV-2 RNA is generally detectable in upper and lower  respiratory specimens during the acute phase of infection. The lowest  concentration of SARS-CoV-2 viral copies this assay can detect is 250  copies / mL. A negative result does not preclude SARS-CoV-2 infection  and should not be used as the sole basis for treatment or other  patient management decisions.  A negative result may occur with  improper specimen collection / handling, submission of specimen other  than nasopharyngeal swab, presence of viral mutation(s) within the  areas targeted by this assay, and inadequate number of viral copies  (<250 copies / mL). A negative result must be combined with clinical  observations, patient history, and epidemiological information. If result is POSITIVE SARS-CoV-2 target nucleic acids are DETECTED. The SARS-CoV-2 RNA is generally detectable in upper and lower  respiratory specimens dur ing the acute phase of infection.  Positive  results are indicative of active infection with SARS-CoV-2.  Clinical  correlation with patient history and other diagnostic information is  necessary to determine patient infection status.  Positive results do  not rule out bacterial infection or  co-infection with other viruses. If result is PRESUMPTIVE POSTIVE SARS-CoV-2 nucleic acids MAY BE PRESENT.   A presumptive positive result was obtained on the submitted specimen  and confirmed on repeat testing.  While 2019 novel coronavirus  (SARS-CoV-2) nucleic acids may be present in the submitted sample  additional confirmatory testing may be necessary for epidemiological  and / or clinical management purposes  to differentiate between  SARS-CoV-2 and other Sarbecovirus currently known to infect humans.  If clinically indicated additional testing with an alternate test  methodology [phone]) is advised. The SARS-CoV-2 RNA is generally  detectable in upper and lower respiratory sp ecimens during the acute  phase of infection. The expected result is Negative. Fact Sheet for Patients:  StrictlyIdeas.no Fact Sheet for Healthcare Providers: BankingDealers.co.za This test is not yet approved or cleared by the [place] FDA and has been authorized for detection and/or diagnosis of SARS-CoV-2 by FDA under an Emergency Use Authorization (EUA).  This EUA will remain in effect (meaning this test can be used) for the duration of the COVID-19 declaration under Section 564(b)(1) of the Act, 21 U.S.C. section 360bbb-3(b)(1), unless the authorization is terminated or revoked sooner. Performed at [facility], [place] [address] [place], [place] [address]      Radiological Exams on Admission: Dg Chest 2 View  Result Date: 02/24/2019 CLINICAL DATA:  Chest pain and shortness of breath, recently missed dialysis episode EXAM: CHEST - 2 VIEW COMPARISON:  03/17/2018 FINDINGS: Cardiac shadow is enlarged but stable consistent with a degree of  volume overload. Mild vascular congestion is again seen without significant edema. Chronic blunting of left costophrenic angle is noted. No focal infiltrate is seen. IMPRESSION: Mild vascular congestion consistent  with the recently missed dialysis episode. Chronic changes in the left base. Electronically Signed   By: Inez Catalina M.D.   On: 02/24/2019 23:25     EKG: Independently reviewed.  Sinus rhythm, QTC 509, LAE, T wave peaking in diffusely, ST depression in V6, poor R wave progression   Assessment/Plan Principal Problem:   Hyperkalemia Active Problems:   Chest pain   Chronic combined systolic (congestive) and diastolic (congestive) heart failure (HCC)   Elevated troponin   Essential hypertension   Anemia of chronic kidney failure   ESRD on dialysis ([place])   Anemia due to chronic kidney disease   Type II diabetes mellitus with renal manifestations (HCC)   TIA (transient ischemic attack)   GERD (gastroesophageal reflux disease)   Tobacco abuse   Fluid overload   Uremia   Hyperkalemia and uremia and ESRD-HD: potassium 7.5 with T wave peaking on EKG, bicarbonate 19, creatinine 9.54, BUN 95. This is due to noncompliance to dialysis.  Renal, Dr. Carolin Sicks was consulted for urgent dialysis, which is highly appreciated.  -Will place on telemetry bed for observation - Urgent dialysis per renal -Hyperkalemia was treated with 1 g of calcium gluconate, 5 unit for NovoLog, D50 and 50 mEq of sodium bicarbonate before dialysis.  Chronic combined systolic (congestive) and diastolic (congestive) heart failure: pt has fluid overload and vascular congestion on chest x-ray.  This is due to dialysis noncompliance.  2D echo on 01/16/2016 showed EF of 45 to 60% with grade 2 diastolic dysfunction. -Volume management per renal by dialysis.  Chest pain and elevated troponin: Troponin 27.  Most likely due to demand ischemia secondary to fluid overload. - prn Nitroglycerin, and lipitor  - will not give ASA due to recent GIB - Risk factor stratification: will check FLP and A1C  - check UDS -trend trop  HTN:  -Continue home medications: Labetalol -IV hydralazine prn  Anemia of chronic kidney failure/ESRD:  Hgb 8.7. History of GI bleed: Received Mircera and iron on 7/18.   -continue iron supplement  Type II diabetes mellitus with renal manifestations ([place]): Last A1c 5.9 on 11/17/18, well controled. Patient is taking NovoLog and Lantus at home -will decrease Lantus dose from 20 to 15  Units daily  -SSI  Hx of TIA (transient ischemic attack): -Lipitor  GERD (gastroesophageal reflux disease): -Protonix  Tobacco abuse: -Did counseling about importance of quitting smoking -Nicotine patch    DVT ppx: SQ Heparin    Code Status: Full code Family Communication: None at bed side.   Disposition Plan:  Anticipate discharge back to previous home environment Consults called: Dr. Carolin Sicks of nephrology Admission status: Obs / tele   Date of Service 02/25/2019    [facility]   If 7PM-7AM, please contact night-coverage [email or web address] Password [facility] 02/25/2019, 4:17 AM

## 2019-02-25 NOTE — ED Provider Notes (Signed)
Patient seen/examined in the Emergency Department in conjunction with Advanced Practice Provider  Patient reports cp/sob Exam : awake/alert, appears uncomfortable/tachypneic Plan: admit for dialysis - pt with significant hyperkalemia Nephrology consulted meds have been ordered     Ripley Fraise, MD 02/25/19 0050

## 2019-02-25 NOTE — Consult Note (Signed)
[facility] Reason for Consult: To manage dialysis and dialysis related needs Referring Physician: EDP and Dr Blaine Hamper  HPI:  [name] is an 53 y.o. female with history of bipolar disorder, CHF, anxiety depression, chronic pain syndrome, schizoaffective disorder, ESRD on HD TTS at [facility] presented because of worsening shortness of breath and generalized body pain.  Patient had her last dialysis on Saturday and missed her treatment yesterday.  Patient reported that she missed because of generalized body pain.  In the ER, she was found to have hypertensive urgency, serum potassium level more than 7.5 and chest x-ray with pulmonary vascular congestion.  She is on oxygen currently.  Received calcium gluconate, bicarbonate, insulin and dextrose for hyperkalemia. On Saturday patient had only 2 hours 48 minutes of treatment when net 1.7 L of fluid removed.  She has chronic hyperkalemia. We are consulted for hyperkalemia.  Plan for urgent dialysis tonight.  She has a left upper extremity AV graft.  Dialyzes at [facility] 72 kg. HD Bath 2K, 2 ca, Time 3.5 hr,  Mircera 225 mcg on 7/18 Iron 50 mg on 7/18  Access Left AVG  Past Medical History:  Diagnosis Date  . Anemia   . Anxiety   . Bipolar disorder, unspecified ([place]) 02/06/2014  . Chronic combined systolic and diastolic CHF (congestive heart failure) (HCC)    EF 40% by echo and 48% by stress test  . Chronic pain syndrome   . Depression   . ESRD on hemodialysis (HCC)    TTS  . GERD (gastroesophageal reflux disease)   . Glaucoma   . Headache    sinus headaches  . Hepatitis C   . High cholesterol   . History of hiatal hernia   . History of vitamin D deficiency 06/20/2015  . Hypertension   . MRSA infection ?2014   "on the back of my head; spread throughout my bloodstream"  . Neuropathy   . Refusal of blood transfusions as patient is Jehovah's Witness   . Schizoaffective disorder, unspecified condition  02/08/2014  . Seizure [facility]) dx'd 2014   "don't know what kind; last one was ~ 04/2014"  . TIA (transient ischemic attack) 2010  . Type II diabetes mellitus ([place])    INSULIN DEPENDENT    Past Surgical History:  Procedure Laterality Date  . ABDOMINAL HYSTERECTOMY  2014  . ABDOMINAL HYSTERECTOMY  2010  . AMPUTATION Left 05/21/2013   Procedure: Left Midfoot AMPUTATION;  Surgeon: Newt Minion, MD;  Location: [place];  Service: Orthopedics;  Laterality: Left;  Left Midfoot Amputation  . AV FISTULA PLACEMENT Left 05/04/2015   Procedure: ARTERIOVENOUS (AV) GRAFT INSERTION;  Surgeon: Angelia Mould, MD;  Location: [place];  Service: Vascular;  Laterality: Left;  . BIOPSY  11/18/2018   Procedure: BIOPSY;  Surgeon: Jerene Bears, MD;  Location: [facility] ENDOSCOPY;  Service: Gastroenterology;;  . COLONOSCOPY WITH PROPOFOL N/A 06/22/2013   Procedure: COLONOSCOPY WITH PROPOFOL;  Surgeon: Jeryl Columbia, MD;  Location: [facility] ENDOSCOPY;  Service: Endoscopy;  Laterality: N/A;  . COLONOSCOPY WITH PROPOFOL N/A 11/18/2018   Procedure: COLONOSCOPY WITH PROPOFOL;  Surgeon: Jerene Bears, MD;  Location: [place];  Service: Gastroenterology;  Laterality: N/A;  . ENTEROSCOPY N/A 12/31/2018   Procedure: ENTEROSCOPY;  Surgeon: Irene Shipper, MD;  Location: [facility] ENDOSCOPY;  Service: Endoscopy;  Laterality: N/A;  Push enteroscopy at 8am (spoke with Endo)  . ESOPHAGOGASTRODUODENOSCOPY N/A 05/11/2015   Procedure: ESOPHAGOGASTRODUODENOSCOPY (EGD);  Surgeon:  Laurence Spates, MD;  Location: [facility] ENDOSCOPY;  Service: Endoscopy;  Laterality: N/A;  . ESOPHAGOGASTRODUODENOSCOPY (EGD) WITH PROPOFOL N/A 11/18/2018   Procedure: ESOPHAGOGASTRODUODENOSCOPY (EGD) WITH PROPOFOL;  Surgeon: Jerene Bears, MD;  Location: [facility] ENDOSCOPY;  Service: Gastroenterology;  Laterality: N/A;  . EYE SURGERY Bilateral 2010   Lasik  . FLEXIBLE SIGMOIDOSCOPY N/A 01/17/2017   Procedure: FLEXIBLE SIGMOIDOSCOPY w/ hemorrhoid banding possible;  Surgeon: Gatha Mayer,  MD;  Location: [facility] ENDOSCOPY;  Service: Endoscopy;  Laterality: N/A;  . FLEXIBLE SIGMOIDOSCOPY N/A 01/21/2019   Procedure: FLEXIBLE SIGMOIDOSCOPY;  Surgeon: Ronald Lobo, MD;  Location: [facility] ENDOSCOPY;  Service: Endoscopy;  Laterality: N/A;  Plan to do the test unprepped, unsedated  . FOOT AMPUTATION Left    due diabetic neuropathy, could not feel ulcer on bottom of foot  . GIVENS CAPSULE STUDY N/A 01/21/2019   Procedure: GIVENS CAPSULE STUDY;  Surgeon: Ronald Lobo, MD;  Location: [facility] ENDOSCOPY;  Service: Endoscopy;  Laterality: N/A;  . POLYPECTOMY  11/18/2018   Procedure: POLYPECTOMY;  Surgeon: Jerene Bears, MD;  Location: [facility] ENDOSCOPY;  Service: Gastroenterology;;  . REFRACTIVE SURGERY Bilateral 2013  . TONSILLECTOMY  1970's  . TUBAL LIGATION  2000    Family History  Problem Relation Age of Onset  . Hypertension Mother   . Diabetes Mother   . Hypertension Father   . Diabetes Father     Social History:  reports that she has been smoking cigarettes. She has a 36.00 pack-year smoking history. She has never used smokeless tobacco. She reports current alcohol use of about 3.0 standard drinks of alcohol per week. She reports previous drug use. Drugs: "Crack" cocaine and Cocaine.  Allergies:  Allergies  Allergen Reactions  . Penicillins Hives and Other (See Comments)    Has patient had a PCN reaction causing immediate rash, facial/tongue/throat swelling, SOB or lightheadedness with hypotension: Yes Has patient had a PCN reaction causing severe rash involving mucus membranes or skin necrosis: No Has patient had a PCN reaction that required hospitalization No Has patient had a PCN reaction occurring within the last 10 years: No If all of the above answers are "NO", then may proceed with Cephalosporin use. Pt tolerates cefepime and ceftriaxone  . Gabapentin Itching and Swelling  . Lyrica [Pregabalin] Swelling and Other (See Comments)    Facial and leg swelling  . Saphris [Asenapine]  Swelling and Other (See Comments)    Facial swelling  . Tramadol Swelling and Other (See Comments)    Leg swelling  . Adhesive [Tape] Itching    SEVERE ITCHING  . Latex Itching    SEVERE ITCHING  . Other Other (See Comments)    NO BLOOD PRODUCTS; PATIENT IS A JEHOVAH'S WITNESS    Medications: I have reviewed the patient's current medications.   Results for orders placed or performed during the hospital encounter of 02/24/19 (from the past 48 hour(s))  CBC with Differential     Status: Abnormal   Collection Time: 02/24/19 10:38 PM  Result Value Ref Range   WBC 2.8 (L) 4.0 - 10.5 K/uL   RBC 2.56 (L) 3.87 - 5.11 MIL/uL   Hemoglobin 8.7 (L) 12.0 - 15.0 g/dL   HCT 27.9 (L) 36.0 - 46.0 %   MCV 109.0 (H) 80.0 - 100.0 fL   MCH 34.0 26.0 - 34.0 pg   MCHC 31.2 30.0 - 36.0 g/dL   RDW 16.3 (H) 11.5 - 15.5 %   Platelets 141 (L) 150 - 400 K/uL   nRBC 0.0  0.0 - 0.2 %   Neutrophils Relative % 73 %   Neutro Abs 2.0 1.7 - 7.7 K/uL   Lymphocytes Relative 17 %   Lymphs Abs 0.5 (L) 0.7 - 4.0 K/uL   Monocytes Relative 8 %   Monocytes Absolute 0.2 0.1 - 1.0 K/uL   Eosinophils Relative 2 %   Eosinophils Absolute 0.1 0.0 - 0.5 K/uL   Basophils Relative 0 %   Basophils Absolute 0.0 0.0 - 0.1 K/uL   Immature Granulocytes 0 %   Abs Immature Granulocytes 0.01 0.00 - 0.07 K/uL    Comment: Performed at [facility] [address] [place], [place] [address]  Comprehensive metabolic panel     Status: Abnormal   Collection Time: 02/24/19 10:38 PM  Result Value Ref Range   Sodium 131 (L) 135 - 145 mmol/L   Potassium >7.5 (HH) 3.5 - 5.1 mmol/L    Comment: NO VISIBLE HEMOLYSIS CRITICAL RESULT CALLED TO, READ BACK BY AND VERIFIED WITH: BROOKS,M RN 02/24/2019 2353 JORDANS    Chloride 91 (L) 98 - 111 mmol/L   CO2 19 (L) 22 - 32 mmol/L   Glucose, Bld 141 (H) 70 - 99 mg/dL   BUN 95 (H) 6 - 20 mg/dL   Creatinine, Ser 9.54 (H) 0.44 - 1.00 mg/dL   Calcium 9.3 8.9 - 10.3 mg/dL   Total Protein 8.4  (H) 6.5 - 8.1 g/dL   Albumin 4.0 3.5 - 5.0 g/dL   AST 37 15 - 41 U/L   ALT 39 0 - 44 U/L   Alkaline Phosphatase 126 38 - 126 U/L   Total Bilirubin 1.1 0.3 - 1.2 mg/dL   GFR calc non Af Amer 4 (L) >60 mL/min   GFR calc Af Amer 5 (L) >60 mL/min   Anion gap 21 (H) 5 - 15    Comment: Performed at [facility], [place] [address] [place],  [address]  Troponin I (High Sensitivity)     Status: Abnormal   Collection Time: 02/24/19 10:38 PM  Result Value Ref Range   Troponin I (High Sensitivity) 27 (H) <18 ng/L    Comment: (NOTE) Elevated high sensitivity troponin I (hsTnI) values and significant  changes across serial measurements may suggest ACS but many other  chronic and acute conditions are known to elevate hsTnI results.  Refer to the "Links" section for chest pain algorithms and additional  guidance. Performed at [facility], [place] [address] [place],  [address]   POC CBG, ED     Status: None   Collection Time: 02/25/19 12:40 AM  Result Value Ref Range   Glucose-Capillary 99 70 - 99 mg/dL    Dg Chest 2 View  Result Date: 02/24/2019 CLINICAL DATA:  Chest pain and shortness of breath, recently missed dialysis episode EXAM: CHEST - 2 VIEW COMPARISON:  03/17/2018 FINDINGS: Cardiac shadow is enlarged but stable consistent with a degree of volume overload. Mild vascular congestion is again seen without significant edema. Chronic blunting of left costophrenic angle is noted. No focal infiltrate is seen. IMPRESSION: Mild vascular congestion consistent with the recently missed dialysis episode. Chronic changes in the left base. Electronically Signed   By: Inez Catalina M.D.   On: 02/24/2019 23:25    ROS: Reports shortness of breath, generalized weakness.  Denied chest pain, nausea vomiting.  Has generalized body pain.  Other review of systems reviewed and negative. Blood pressure (!) 178/91, pulse 82, temperature 97.7 F (36.5 C), temperature source  Oral, resp. rate (!)  0, SpO2 100 %. General: Lying on bed, not in distress, on oxygen via nasal cannula Neck: Mild JVD distended Respiratory: Bilateral basal crackle, no wheezing Cardiovascular: Regular rate rhythm S1-S2 normal, no rubs appreciated GI: Abdomen soft, nontender, bowel sounds positive Extremity: No edema Skin: No rash or eczema Neurology: Alert awake and following commands Vascular: Left upper extremity AV fistula has good thrill and bruit  Assessment/Plan: # Hyperkalemia: K>7.5 due to missed dialysis.  EKG with peaked T waves.  She has issue with chronic hyperkalemia.  Received medical treatment including sodium bicarbonate, insulin, dextrose, calcium.  Plan for urgent dialysis tonight.  # Shortness of breath and hypertensive urgency in the setting of missed dialysis: HD with ultrafiltration around 3 to 4 kg as tolerated.  May need another dialysis tomorrow.  Resume home medication.  # ESRD: TTS: Missed dialysis yesterday.  May need extra treatment, will reevaluate.  # Anemia of ESRD: History of GI bleed: Received Mircera and iron on 7/18.  Monitor CBC.  # Metabolic Bone Disease: Monitor calcium phosphorus.  Recommend to resume calcitriol.  Discussed with the primary team and dialysis nurse.  Urgent HD tonight for hyperkalemia.  Khris Jansson Tanna Furry 02/25/2019, 1:25 AM

## 2019-02-25 NOTE — Plan of Care (Signed)

## 2019-02-25 NOTE — ED Provider Notes (Signed)
[facility] EMERGENCY DEPARTMENT Provider Note   CSN: [id number] Arrival date & time: 02/24/19  2221    History   Chief Complaint Chief Complaint  Patient presents with  . Shortness of Breath    HPI [name] is a 53 y.o. female presenting for evaluation of chest pain and shortness of breath.  Patient states of the past 2 to 3 days, she has been having persistent shortness of breath.  Is worse when she is lying flat and with exertion.  She also reports left-sided chest pain which is constant.  She cannot describe how it feels.  It is not worse with exertion.  Patient states she missed her dialysis session yesterday, and cannot remember if she went on Saturday.  Patient goes to dialysis Tuesday, Thursday, Saturday.  She states she feels more swollen her abdomen, like it is pushing on her ribs.  She denies fevers, chills, cough, nausea, vomiting.  She reduces a minimal amount of urine, that has not changed.  She reports no BM for the past 4 days, although she struggle with constipation chronically.   Additional history obtained from chart review, patient with a history of bipolar, CHF, ESRD on dialysis, GERD, hep C, hypertension, diabetes, schizoaffective disorder, TIA, GI bleed.  She was recently admitted for GI bleed 01/20/2019.      HPI  Past Medical History:  Diagnosis Date  . Anemia   . Anxiety   . Bipolar disorder, unspecified ([place]) 02/06/2014  . Chronic combined systolic and diastolic CHF (congestive heart failure) (HCC)    EF 40% by echo and 48% by stress test  . Chronic pain syndrome   . Depression   . ESRD on hemodialysis (HCC)    TTS  . GERD (gastroesophageal reflux disease)   . Glaucoma   . Headache    sinus headaches  . Hepatitis C   . High cholesterol   . History of hiatal hernia   . History of vitamin D deficiency 06/20/2015  . Hypertension   . MRSA infection ?2014   "on the back of my head; spread throughout my bloodstream"  . Neuropathy    . Refusal of blood transfusions as patient is Jehovah's Witness   . Schizoaffective disorder, unspecified condition 02/08/2014  . Seizure [facility]) dx'd 2014   "don't know what kind; last one was ~ 04/2014"  . TIA (transient ischemic attack) 2010  . Type II diabetes mellitus ([place])    INSULIN DEPENDENT    Patient Active Problem List   Diagnosis Date Noted  . Type II diabetes mellitus with renal manifestations ([place]) 02/25/2019  . TIA (transient ischemic attack) 02/25/2019  . GERD (gastroesophageal reflux disease) 02/25/2019  . Melena   . Rectal bleed 12/30/2018  . Gastritis and gastroduodenitis   . Benign neoplasm of ascending colon   . Benign neoplasm of transverse colon   . Benign neoplasm of descending colon   . GI bleeding 11/17/2018  . Acute blood loss anemia   . No blood products   . GI bleed 10/05/2018  . Prolonged QT interval 10/05/2018  . Localized edema due to fluid overload 09/05/2017  . Lower GI bleed   . Evaluation by psychiatric service required 07/17/2017  . Volume overload 06/03/2017  . Right supracondylar humerus fracture, with nonunion, subsequent encounter 05/01/2017  . Malnutrition of moderate degree 02/28/2017  . Leg pain, left 02/25/2017  . Left leg pain 02/25/2017  . External hemorrhoids   . Diverticulosis of colon without hemorrhage   .  Goals of care, counseling/discussion   . Palliative care encounter   . Cellulitis of left leg 01/16/2017  . Upper GI bleed 01/16/2017  . UGIB (upper gastrointestinal bleed)   . Anemia due to chronic kidney disease   . Cellulitis of left thigh 01/01/2017  . ESRD on dialysis ([place]) 01/01/2017  . Cocaine use disorder, severe, dependence ([place]) 08/29/2016  . Hepatitis C 08/29/2016  . Diabetic ulcer of calf ([place]) 08/29/2016  . Diabetic ulcer of toe ([place]) 08/29/2016  . Cerebrovascular accident (CVA) ([place]) 08/29/2016  . MDD (major depressive disorder) 08/28/2016  . Dyslipidemia associated with type 2 diabetes mellitus ([place])  11/10/2015  . Hyperkalemia 09/17/2015  . History of vitamin D deficiency 06/20/2015  . Long term prescription opiate use 06/20/2015  . Chronic pain syndrome 06/19/2015  . Anemia of chronic kidney failure 05/01/2015  . transmetatarsal amputation of the left foot 12/13/2014  . Unilateral visual loss 11/22/2014  . 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 ([place]) 11/22/2014  . Essential hypertension 11/09/2014  . Dizziness   . Chronic combined systolic (congestive) and diastolic (congestive) heart failure ([place])   . Rectal bleeding 04/23/2014  . Anemia, iron deficiency 04/13/2014  . Noncompliance 02/02/2014  . Hyponatremia 05/19/2013  . Seizure disorder ([place]) 02/09/2013    Past Surgical History:  Procedure Laterality Date  . ABDOMINAL HYSTERECTOMY  2014  . ABDOMINAL HYSTERECTOMY  2010  . AMPUTATION Left 05/21/2013   Procedure: Left Midfoot AMPUTATION;  Surgeon: Newt Minion, MD;  Location: [place];  Service: Orthopedics;  Laterality: Left;  Left Midfoot Amputation  . AV FISTULA PLACEMENT Left 05/04/2015   Procedure: ARTERIOVENOUS (AV) GRAFT INSERTION;  Surgeon: Angelia Mould, MD;  Location: [place];  Service: Vascular;  Laterality: Left;  . BIOPSY  11/18/2018   Procedure: BIOPSY;  Surgeon: Jerene Bears, MD;  Location: [facility] ENDOSCOPY;  Service: Gastroenterology;;  . COLONOSCOPY WITH PROPOFOL N/A 06/22/2013   Procedure: COLONOSCOPY WITH PROPOFOL;  Surgeon: Jeryl Columbia, MD;  Location: [facility] ENDOSCOPY;  Service: Endoscopy;  Laterality: N/A;  . COLONOSCOPY WITH PROPOFOL N/A 11/18/2018   Procedure: COLONOSCOPY WITH PROPOFOL;  Surgeon: Jerene Bears, MD;  Location: [place];  Service: Gastroenterology;  Laterality: N/A;  . ENTEROSCOPY N/A 12/31/2018   Procedure: ENTEROSCOPY;  Surgeon: Irene Shipper, MD;  Location: [facility] ENDOSCOPY;  Service: Endoscopy;  Laterality: N/A;  Push enteroscopy at 8am (spoke with Endo)  . ESOPHAGOGASTRODUODENOSCOPY N/A 05/11/2015   Procedure:  ESOPHAGOGASTRODUODENOSCOPY (EGD);  Surgeon: Laurence Spates, MD;  Location: [facility] ENDOSCOPY;  Service: Endoscopy;  Laterality: N/A;  . ESOPHAGOGASTRODUODENOSCOPY (EGD) WITH PROPOFOL N/A 11/18/2018   Procedure: ESOPHAGOGASTRODUODENOSCOPY (EGD) WITH PROPOFOL;  Surgeon: Jerene Bears, MD;  Location: [facility] ENDOSCOPY;  Service: Gastroenterology;  Laterality: N/A;  . EYE SURGERY Bilateral 2010   Lasik  . FLEXIBLE SIGMOIDOSCOPY N/A 01/17/2017   Procedure: FLEXIBLE SIGMOIDOSCOPY w/ hemorrhoid banding possible;  Surgeon: Gatha Mayer, MD;  Location: [facility] ENDOSCOPY;  Service: Endoscopy;  Laterality: N/A;  . FLEXIBLE SIGMOIDOSCOPY N/A 01/21/2019   Procedure: FLEXIBLE SIGMOIDOSCOPY;  Surgeon: Ronald Lobo, MD;  Location: [facility] ENDOSCOPY;  Service: Endoscopy;  Laterality: N/A;  Plan to do the test unprepped, unsedated  . FOOT AMPUTATION Left    due diabetic neuropathy, could not feel ulcer on bottom of foot  . GIVENS CAPSULE STUDY N/A 01/21/2019   Procedure: GIVENS CAPSULE STUDY;  Surgeon: Ronald Lobo, MD;  Location: [facility] ENDOSCOPY;  Service: Endoscopy;  Laterality: N/A;  . POLYPECTOMY  11/18/2018   Procedure: POLYPECTOMY;  Surgeon: Jerene Bears, MD;  Location: [facility] ENDOSCOPY;  Service: Gastroenterology;;  . REFRACTIVE SURGERY Bilateral 2013  . TONSILLECTOMY  1970's  . TUBAL LIGATION  2000     OB History    Gravida  0   Para  0   Term  0   Preterm  0   AB  0   Living        SAB  0   TAB  0   Ectopic  0   Multiple      Live Births               Home Medications    Prior to Admission medications   Medication Sig Start Date End Date Taking? Authorizing Provider  albuterol (VENTOLIN HFA) 108 (90 Base) MCG/ACT inhaler Inhale 2 puffs into the lungs every 4 (four) hours as needed for wheezing or shortness of breath. 01/26/19   Dahal, Marlowe Aschoff, MD  allopurinol (ZYLOPRIM) 100 MG tablet Take 2 tablets (200 mg total) by mouth daily for 30 days. 01/27/19 02/26/19  Terrilee Croak, MD  atorvastatin  (LIPITOR) 40 MG tablet Take 1 tablet (40 mg total) by mouth at bedtime. 01/26/19   Terrilee Croak, MD  calcitRIOL (ROCALTROL) 0.25 MCG capsule Take 7 capsules (1.75 mcg total) by mouth every dialysis (3 times per week on TTS). Patient taking differently: Take 1 mcg by mouth every dialysis (3 times per week on TTS). Taking 4 capsules (0.16mcg) = 58mcg 10/10/18   Ghimire, Henreitta Leber, MD  Darbepoetin Alfa (ARANESP) 200 MCG/0.4ML SOSY injection Inject 0.4 mLs (200 mcg total) into the vein every Wednesday with hemodialysis. 01/27/19   Terrilee Croak, MD  diphenhydrAMINE (BENADRYL) 25 mg capsule Take 1 capsule (25 mg total) by mouth every 6 (six) hours as needed for up to 8 days for itching. 01/26/19 02/03/19  Dahal, Marlowe Aschoff, MD  docusate sodium (COLACE) 100 MG capsule Take 1 capsule (100 mg total) by mouth 2 (two) times daily for 30 days. 01/26/19 02/25/19  Terrilee Croak, MD  DULoxetine (CYMBALTA) 30 MG capsule Take 3 capsules (90 mg total) by mouth daily for 30 days. 01/26/19 02/25/19  Terrilee Croak, MD  ferric citrate (AURYXIA) 1 GM 210 MG(Fe) tablet Take 2 tablets (420 mg total) by mouth 3 (three) times daily with meals for 30 days. 01/26/19 02/25/19  Terrilee Croak, MD  ferric gluconate 125 mg in sodium chloride 0.9 % 100 mL Inject 125 mg into the vein Every Tuesday,Thursday,and Saturday with dialysis. 01/26/19   Terrilee Croak, MD  folic acid (FOLVITE) 1 MG tablet Take 1 tablet (1 mg total) by mouth daily for 30 days. 01/26/19 02/25/19  Terrilee Croak, MD  hydrocortisone (ANUSOL-HC) 2.5 % rectal cream Place rectally 2 (two) times daily. 01/26/19   Dahal, Marlowe Aschoff, MD  insulin aspart (NOVOLOG) 100 UNIT/ML injection 0-9 Units, Subcutaneous, 3 times daily with meals CBG < 70: implement hypoglycemia protocol CBG 70 - 120: 0 units CBG 121 - 150: 1 unit CBG 151 - 200: 2 units CBG 201 - 250: 3 units CBG 251 - 300: 5 units CBG 301 - 350: 7 units CBG 351 - 400: 9 units CBG > 400: call MD 01/26/19   Terrilee Croak, MD  insulin glargine  (LANTUS) 100 UNIT/ML injection Inject 0.2 mLs (20 Units total) into the skin at bedtime for 30 days. 01/26/19 02/25/19  Terrilee Croak, MD  labetalol (NORMODYNE) 200 MG tablet Take 1 tablet (200 mg total) by mouth 2 (two) times daily for 30 days. 01/26/19  02/25/19  Terrilee Croak, MD  lidocaine-prilocaine (EMLA) cream Apply 1 application topically See admin instructions. On dialysis days 12/23/18   [provider]  multivitamin (RENA-VIT) TABS tablet Take 1 tablet by mouth at bedtime for 30 days. 01/26/19 02/25/19  Terrilee Croak, MD  mupirocin ointment (BACTROBAN) 2 % Apply 1 application topically as needed (on arm).  12/01/18   [provider]  oxymetazoline (AFRIN) 0.05 % nasal spray Place 2 sprays into both nostrils 2 (two) times daily. 01/26/19   Terrilee Croak, MD  pantoprazole (PROTONIX) 40 MG tablet Take 1 tablet (40 mg total) by mouth daily at 6 (six) AM for 30 days. 01/26/19 02/25/19  Terrilee Croak, MD  saccharomyces boulardii (FLORASTOR) 250 MG capsule Take 1 capsule (250 mg total) by mouth daily for 30 days. 01/26/19 02/25/19  Terrilee Croak, MD  senna (SENOKOT) 8.6 MG tablet Take 1 tablet (8.6 mg total) by mouth daily for 30 days. 01/26/19 02/25/19  Terrilee Croak, MD  traZODone (DESYREL) 50 MG tablet Take 0.5 tablets (25 mg total) by mouth at bedtime for 30 days. 01/26/19 02/25/19  Terrilee Croak, MD    Family History Family History  Problem Relation Age of Onset  . Hypertension Mother   . Diabetes Mother   . Hypertension Father   . Diabetes Father     Social History Social History   Tobacco Use  . Smoking status: Current Some Day Smoker    Packs/day: 1.00    Years: 36.00    Pack years: 36.00    Types: Cigarettes  . Smokeless tobacco: Never Used  . Tobacco comment: 5 cigs in one setting  Substance Use Topics  . Alcohol use: Yes    Alcohol/week: 3.0 standard drinks    Types: 3 Standard drinks or equivalent per week    Frequency: Never    Comment: last use about 2 weeks ago   . Drug use: Not Currently    Types: "Crack" cocaine, Cocaine    Comment: last use >2 years ago     Allergies   Penicillins, Gabapentin, Lyrica [pregabalin], Saphris [asenapine], Tramadol, Adhesive [tape], Latex, and Other   Review of Systems Review of Systems  Respiratory: Positive for shortness of breath.   Cardiovascular: Positive for chest pain.  Gastrointestinal: Positive for abdominal distention and constipation.  All other systems reviewed and are negative.    Physical Exam Updated Vital Signs BP (!) 178/91   Pulse 82   Temp 97.7 F (36.5 C) (Oral)   Resp (!) 0   LMP  (LMP Unknown)   SpO2 100%   Physical Exam Vitals signs and nursing note reviewed.  Constitutional:      General: She is not in acute distress.    Appearance: She is well-developed.     Comments: Appears nontoxic  HENT:     Head: Normocephalic and atraumatic.  Eyes:     Extraocular Movements: Extraocular movements intact.     Conjunctiva/sclera: Conjunctivae normal.     Pupils: Pupils are equal, round, and reactive to light.  Neck:     Musculoskeletal: Normal range of motion and neck supple.  Cardiovascular:     Rate and Rhythm: Normal rate and regular rhythm.     Pulses: Normal pulses.  Pulmonary:     Effort: Pulmonary effort is normal. No respiratory distress.     Breath sounds: Normal breath sounds. No wheezing.     Comments: Speaking in full sentences.  Flaring sounds in all fields.  Mildly tachypneic with talking, but  no tachypnea at rest. Abdominal:     Palpations: Abdomen is soft. There is no mass.     Tenderness: There is no abdominal tenderness. There is no guarding or rebound.     Comments: No tenderness palpation the abdomen.  Soft without rigidity, guarding.  Negative rebound.  No peritonitis.  Musculoskeletal: Normal range of motion.     Comments: Partial left foot amputation.  Pedal pulses intact.  No significant lower extremity edema. Thrill palpable over left forearm  fistula.  Skin:    General: Skin is warm and dry.     Capillary Refill: Capillary refill takes less than 2 seconds.  Neurological:     Mental Status: She is alert and oriented to person, place, and time.      ED Treatments / Results  Labs (all labs ordered are listed, but only abnormal results are displayed) Labs Reviewed  CBC WITH DIFFERENTIAL/PLATELET - Abnormal; Notable for the following components:      Result Value   WBC 2.8 (*)    RBC 2.56 (*)    Hemoglobin 8.7 (*)    HCT 27.9 (*)    MCV 109.0 (*)    RDW 16.3 (*)    Platelets 141 (*)    Lymphs Abs 0.5 (*)    All other components within normal limits  COMPREHENSIVE METABOLIC PANEL - Abnormal; Notable for the following components:   Sodium 131 (*)    Potassium >7.5 (*)    Chloride 91 (*)    CO2 19 (*)    Glucose, Bld 141 (*)    BUN 95 (*)    Creatinine, Ser 9.54 (*)    Total Protein 8.4 (*)    GFR calc non Af Amer 4 (*)    GFR calc Af Amer 5 (*)    Anion gap 21 (*)    All other components within normal limits  TROPONIN I (HIGH SENSITIVITY) - Abnormal; Notable for the following components:   Troponin I (High Sensitivity) 27 (*)    All other components within normal limits  SARS CORONAVIRUS 2 (HOSPITAL ORDER, [place] [facility])  CBG MONITORING, ED  TROPONIN I (HIGH SENSITIVITY)    EKG EKG Interpretation  Date/Time:  Wednesday February 24 2019 22:35:51 EDT Ventricular Rate:  83 PR Interval:    QRS Duration: 148 QT Interval:  433 QTC Calculation: 509 R Axis:   154 Text Interpretation:  Sinus rhythm Prolonged PR interval Probable left atrial enlargement Nonspecific intraventricular conduction delay Abnormal T, consider ischemia, lateral leads Confirmed by Ripley Fraise [phone]) on 02/24/2019 11:41:16 PM   Radiology Dg Chest 2 View  Result Date: 02/24/2019 CLINICAL DATA:  Chest pain and shortness of breath, recently missed dialysis episode EXAM: CHEST - 2 VIEW COMPARISON:  03/17/2018  FINDINGS: Cardiac shadow is enlarged but stable consistent with a degree of volume overload. Mild vascular congestion is again seen without significant edema. Chronic blunting of left costophrenic angle is noted. No focal infiltrate is seen. IMPRESSION: Mild vascular congestion consistent with the recently missed dialysis episode. Chronic changes in the left base. Electronically Signed   By: Inez Catalina M.D.   On: 02/24/2019 23:25    Procedures .Critical Care Performed by: Franchot Heidelberg, PA-C Authorized by: Franchot Heidelberg, PA-C   Critical care provider statement:    Critical care time (minutes):  35   Critical care time was exclusive of:  Separately billable procedures and treating other patients and teaching time   Critical care was necessary to  treat or prevent imminent or life-threatening deterioration of the following conditions:  Metabolic crisis   Critical care was time spent personally by me on the following activities:  Blood draw for specimens, development of treatment plan with patient or surrogate, discussions with consultants, evaluation of patient's response to treatment, examination of patient, obtaining history from patient or surrogate, ordering and performing treatments and interventions, ordering and review of laboratory studies, ordering and review of radiographic studies, pulse oximetry, re-evaluation of patient's condition and review of old charts   I assumed direction of critical care for this patient from another provider in my specialty: no   Comments:     Pt with critically high potassium of >7.5 requiring calcium, insulin, and emergent dialysis.    (including critical care time)  Medications Ordered in ED Medications  calcium chloride 1 g in sodium chloride 0.9 % 100 mL IVPB (1 g Intravenous New Bag/Given 02/25/19 0046)  sodium bicarbonate injection 50 mEq (has no administration in time range)  insulin aspart (novoLOG) injection 5 Units (5 Units Intravenous  Given 02/25/19 0042)    And  dextrose 50 % solution 50 mL (50 mLs Intravenous Given 02/25/19 0042)     Initial Impression / Assessment and Plan / ED Course  I have reviewed the triage vital signs and the nursing notes.  Pertinent labs & imaging results that were available during my care of the patient were reviewed by me and considered in my medical decision making (see chart for details).        Patient presenting for evaluation chest pain shortness of breath.  Physical exam shows patient who appears nontoxic.  Slight tachypnea when talking but none at rest.  No significant peripheral edema.  Will obtain labs, EKG, chest x-ray, and reassess.  Chest x-ray viewed interpreted by me, shows mild congestion but no obvious effusion.  EKG similar to previous, slight PT waves.  Labs show improved hemoglobin at 8.7.  Leukopenia at 2.8, trending downwards.  Potassium critically high greater than 7.5.  Calcium, insulin, dextrose started.  Will consult with nephrology.  Discussed with Dr. Carolin Sicks from nephrology who recommends emergent dialysis tonight and admission to the hospitalist service.  Discussed with Dr. Blaine Hamper from triad hospitalist service, patient to be admitted.  Final Clinical Impressions(s) / ED Diagnoses   Final diagnoses:  Hyperkalemia  Chest pain, unspecified type  End-stage renal disease needing dialysis [facility])    ED Discharge Orders    None       Franchot Heidelberg, PA-C 02/25/19 0053    Ripley Fraise, MD 02/25/19 [phone]

## 2019-02-25 NOTE — Progress Notes (Signed)
Pt refusing tele monitoring. MD notified. Will continue to monitor pt.

## 2019-02-25 NOTE — Progress Notes (Signed)
  Patient seen and evaluated, chart reviewed, please see EMR for updated orders. Please see full H&P dictated by admitting physician Dr. Ivor Costa for same date of service.      53 y.o. female with history of bipolar disorder, CHF, anxiety depression, chronic pain syndrome, schizoaffective disorder, ESRD on HD TTS at [facility] Admitted with hyperkalemia (K= > 7.5) with EKG changes after missed hemodialysis session . Pt had emergency hemodialysis overnight -Potassium is normalized  --- Plan is for serial/back-to-back hemodialysis sessions to address fluid/volume status and electrolyte abnormalities   Patient seen and evaluated, chart reviewed, please see EMR for updated orders. Please see full H&P dictated by admitting physician Dr. Ivor Costa for same date of service.   Roxan Hockey, MD

## 2019-02-25 NOTE — Progress Notes (Signed)
Pt is refusing heart monitor and states "the electrodes are itching me". MD notified. Will continue to monitor pt.

## 2019-02-26 LAB — BASIC METABOLIC PANEL
Anion gap: 15 (ref 5–15)
BUN: 52 mg/dL — ABNORMAL HIGH (ref 6–20)
CO2: 26 mmol/L (ref 22–32)
Calcium: 9.3 mg/dL (ref 8.9–10.3)
Chloride: 96 mmol/L — ABNORMAL LOW (ref 98–111)
Creatinine, Ser: 6.42 mg/dL — ABNORMAL HIGH (ref 0.44–1.00)
GFR calc Af Amer: 8 mL/min — ABNORMAL LOW (ref >60)
GFR calc non Af Amer: 7 mL/min — ABNORMAL LOW (ref >60)
Glucose, Bld: 149 mg/dL — ABNORMAL HIGH (ref 70–99)
Potassium: 5.4 mmol/L — ABNORMAL HIGH (ref 3.5–5.1)
Sodium: 137 mmol/L (ref 135–145)

## 2019-02-26 LAB — CBC
HCT: 24.7 % — ABNORMAL LOW (ref 36.0–46.0)
Hemoglobin: 7.6 g/dL — ABNORMAL LOW (ref 12.0–15.0)
MCH: 34.2 pg — ABNORMAL HIGH (ref 26.0–34.0)
MCHC: 30.8 g/dL (ref 30.0–36.0)
MCV: 111.3 fL — ABNORMAL HIGH (ref 80.0–100.0)
Platelets: 105 10*3/uL — ABNORMAL LOW (ref 150–400)
RBC: 2.22 MIL/uL — ABNORMAL LOW (ref 3.87–5.11)
RDW: 16 % — ABNORMAL HIGH (ref 11.5–15.5)
WBC: 2 10*3/uL — ABNORMAL LOW (ref 4.0–10.5)
nRBC: 0 % (ref 0.0–0.2)

## 2019-02-26 LAB — GLUCOSE, CAPILLARY
Glucose-Capillary: 77 mg/dL (ref 70–99)
Glucose-Capillary: 100 mg/dL — ABNORMAL HIGH (ref 70–99)
Glucose-Capillary: 152 mg/dL — ABNORMAL HIGH (ref 70–99)

## 2019-02-26 MED ORDER — CALCITRIOL 0.25 MCG PO CAPS: 1.0000 ug | ORAL_CAPSULE | ORAL | Status: DC | PRN

## 2019-02-26 MED ORDER — ATORVASTATIN CALCIUM 40 MG PO TABS: 40.0000 mg | ORAL_TABLET | Freq: Every day | ORAL | 3 refills | Status: AC

## 2019-02-26 MED ORDER — FOLIC ACID 1 MG PO TABS: 1.0000 mg | ORAL_TABLET | Freq: Every day | ORAL | 11 refills | Status: AC

## 2019-02-26 MED ORDER — DULOXETINE HCL 30 MG PO CPEP: 90.0000 mg | ORAL_CAPSULE | Freq: Every day | ORAL | 5 refills | Status: DC

## 2019-02-26 MED ORDER — FERRIC CITRATE 1 GM 210 MG(FE) PO TABS: 420.0000 mg | ORAL_TABLET | Freq: Three times a day (TID) | ORAL | 0 refills | Status: AC

## 2019-02-26 MED ORDER — ALLOPURINOL 100 MG PO TABS: 200.0000 mg | ORAL_TABLET | Freq: Every day | ORAL | 3 refills | Status: AC

## 2019-02-26 MED ORDER — ACETAMINOPHEN 325 MG PO TABS: 650.0000 mg | ORAL_TABLET | Freq: Four times a day (QID) | ORAL | 0 refills | Status: AC | PRN

## 2019-02-26 MED ORDER — LABETALOL HCL 200 MG PO TABS: 200.0000 mg | ORAL_TABLET | Freq: Two times a day (BID) | ORAL | 5 refills | Status: DC

## 2019-02-26 MED ORDER — SODIUM ZIRCONIUM CYCLOSILICATE 10 G PO PACK
10.0000 g | PACK | Freq: Once | ORAL | Status: AC
  Administered 2019-02-26: 10 g via ORAL
  Filled 2019-02-26: qty 1

## 2019-02-26 MED ORDER — HYDROXYZINE HCL 25 MG PO TABS: 25.0000 mg | ORAL_TABLET | Freq: Three times a day (TID) | ORAL | 0 refills | Status: AC | PRN

## 2019-02-26 MED ORDER — TRAZODONE HCL 50 MG PO TABS: 25.0000 mg | ORAL_TABLET | Freq: Every day | ORAL | 0 refills | Status: DC

## 2019-02-26 MED ORDER — SENNOSIDES-DOCUSATE SODIUM 8.6-50 MG PO TABS: 2.0000 | ORAL_TABLET | Freq: Every day | ORAL | 1 refills | Status: DC

## 2019-02-26 MED ORDER — RENA-VITE PO TABS: 1.0000 | ORAL_TABLET | Freq: Every day | ORAL | 11 refills | Status: AC

## 2019-02-26 NOTE — Progress Notes (Signed)
SATURATION QUALIFICATIONS: (This note is used to comply with regulatory documentation for home oxygen)  Patient Saturations on Room Air at Rest = 98%  Patient Saturations on Room Air while Ambulating = 93%   

## 2019-02-26 NOTE — Discharge Summary (Signed)
[name], is a 53 y.o. female  DOB 12/24/1965  MRN [id number].  Admission date:  02/24/2019  Admitting Physician  Ivor Costa, MD  Discharge Date:  02/26/2019   Primary MD  Rocco Serene, MD  Recommendations for primary care physician for things to follow:  1)Very low-salt diet advised 2)Weigh yourself daily, call if you gain more than 3 pounds in 1 day or more than 5 pounds in 1 week as your diuretic medications may need to be adjusted 3)Limit your Fluid  intake to no more than 50 ounces (1.5 Liters) per day 4) continue outpatient hemodialysis on Tuesdays Thursdays and Saturdays-your next hemodialysis session should be tomorrow Saturday, 02/27/2019 5) follow-up with your primary care physician and your nephrologist as previously scheduled 6) compliance with hemodialysis schedule and compliance weight your medications strongly advised  Admission Diagnosis  Hyperkalemia [E87.5] End-stage renal disease needing dialysis ([place]) [N18.6, Z99.2] Chest pain, unspecified type [R07.9]  Discharge Diagnosis  Hyperkalemia [E87.5] End-stage renal disease needing dialysis ([place]) [N18.6, Z99.2] Chest pain, unspecified type [R07.9]    Principal Problem:   Hyperkalemia Active Problems:   Chest pain   Chronic combined systolic (congestive) and diastolic (congestive) heart failure (HCC)   Elevated troponin   Essential hypertension   Anemia of chronic kidney failure   ESRD on dialysis ([place])   Anemia due to chronic kidney disease   Type II diabetes mellitus with renal manifestations (HCC)   TIA (transient ischemic attack)   GERD (gastroesophageal reflux disease)   Tobacco abuse   Fluid overload   Uremia      Past Medical History:  Diagnosis Date  . Anemia   . Anxiety   . Bipolar disorder, unspecified ([place]) 02/06/2014  . Chronic combined systolic and diastolic CHF (congestive heart failure) (HCC)    EF 40% by  echo and 48% by stress test  . Chronic pain syndrome   . Depression   . ESRD on hemodialysis (HCC)    TTS  . GERD (gastroesophageal reflux disease)   . Glaucoma   . Headache    sinus headaches  . Hepatitis C   . High cholesterol   . History of hiatal hernia   . History of vitamin D deficiency 06/20/2015  . Hypertension   . MRSA infection ?2014   "on the back of my head; spread throughout my bloodstream"  . Neuropathy   . Refusal of blood transfusions as patient is Jehovah's Witness   . Refusal of blood transfusions as patient is Jehovah's Witness   . Schizoaffective disorder, unspecified condition 02/08/2014  . Seizure [facility]) dx'd 2014   "don't know what kind; last one was ~ 04/2014"  . TIA (transient ischemic attack) 2010  . Type II diabetes mellitus ([place])    INSULIN DEPENDENT    Past Surgical History:  Procedure Laterality Date  . ABDOMINAL HYSTERECTOMY  2014  . ABDOMINAL HYSTERECTOMY  2010  . AMPUTATION Left 05/21/2013   Procedure: Left Midfoot AMPUTATION;  Surgeon: Newt Minion, MD;  Location: [facility]  OR;  Service: Orthopedics;  Laterality: Left;  Left Midfoot Amputation  . AV FISTULA PLACEMENT Left 05/04/2015   Procedure: ARTERIOVENOUS (AV) GRAFT INSERTION;  Surgeon: Angelia Mould, MD;  Location: [place];  Service: Vascular;  Laterality: Left;  . BIOPSY  11/18/2018   Procedure: BIOPSY;  Surgeon: Jerene Bears, MD;  Location: [facility] ENDOSCOPY;  Service: Gastroenterology;;  . COLONOSCOPY WITH PROPOFOL N/A 06/22/2013   Procedure: COLONOSCOPY WITH PROPOFOL;  Surgeon: Jeryl Columbia, MD;  Location: [facility] ENDOSCOPY;  Service: Endoscopy;  Laterality: N/A;  . COLONOSCOPY WITH PROPOFOL N/A 11/18/2018   Procedure: COLONOSCOPY WITH PROPOFOL;  Surgeon: Jerene Bears, MD;  Location: [place];  Service: Gastroenterology;  Laterality: N/A;  . ENTEROSCOPY N/A 12/31/2018   Procedure: ENTEROSCOPY;  Surgeon: Irene Shipper, MD;  Location: [facility] ENDOSCOPY;  Service: Endoscopy;  Laterality: N/A;  Push  enteroscopy at 8am (spoke with Endo)  . ESOPHAGOGASTRODUODENOSCOPY N/A 05/11/2015   Procedure: ESOPHAGOGASTRODUODENOSCOPY (EGD);  Surgeon: Laurence Spates, MD;  Location: [facility] ENDOSCOPY;  Service: Endoscopy;  Laterality: N/A;  . ESOPHAGOGASTRODUODENOSCOPY (EGD) WITH PROPOFOL N/A 11/18/2018   Procedure: ESOPHAGOGASTRODUODENOSCOPY (EGD) WITH PROPOFOL;  Surgeon: Jerene Bears, MD;  Location: [facility] ENDOSCOPY;  Service: Gastroenterology;  Laterality: N/A;  . EYE SURGERY Bilateral 2010   Lasik  . FLEXIBLE SIGMOIDOSCOPY N/A 01/17/2017   Procedure: FLEXIBLE SIGMOIDOSCOPY w/ hemorrhoid banding possible;  Surgeon: Gatha Mayer, MD;  Location: [facility] ENDOSCOPY;  Service: Endoscopy;  Laterality: N/A;  . FLEXIBLE SIGMOIDOSCOPY N/A 01/21/2019   Procedure: FLEXIBLE SIGMOIDOSCOPY;  Surgeon: Ronald Lobo, MD;  Location: [facility] ENDOSCOPY;  Service: Endoscopy;  Laterality: N/A;  Plan to do the test unprepped, unsedated  . FOOT AMPUTATION Left    due diabetic neuropathy, could not feel ulcer on bottom of foot  . GIVENS CAPSULE STUDY N/A 01/21/2019   Procedure: GIVENS CAPSULE STUDY;  Surgeon: Ronald Lobo, MD;  Location: [facility] ENDOSCOPY;  Service: Endoscopy;  Laterality: N/A;  . POLYPECTOMY  11/18/2018   Procedure: POLYPECTOMY;  Surgeon: Jerene Bears, MD;  Location: [facility] ENDOSCOPY;  Service: Gastroenterology;;  . REFRACTIVE SURGERY Bilateral 2013  . TONSILLECTOMY  1970's  . TUBAL LIGATION  2000     HPI  from the history and physical done on the day of admission:    Patient coming from:  The patient is coming from home.  At baseline, pt is independent for most of ADL.        Chief Complaint: SOB and chest pain  HPI: [name] is a 53 y.o. female with medical history significant of ESDR-HD (TTS), hypertension, hyperlipidemia, diabetes mellitus, TIA, GERD, gout, depression, anxiety, bipolar disorder, HCV, anemia, GI bleeding, polysubstance abuse, tobacco abuse, CHF with EF of 45%, who presents with shortness breath and  chest pain.  Pt states that she missed her dialysis yesterday,  possibly missed 2 sessions of dialysis.  She developed shortness of breath and chest pain which has been going on for 2 days.  The chest pain is located in the central chest, pressure-like, 9 out of 10 in severity, nonradiating.  She has cough with greenish colored mucus production.  No fever or chills.  Patient denies nausea, vomiting, diarrhea or abdominal pain.  She is constipated.  No symptoms of UTI or unilateral weakness.  ED Course: pt was found to have troponin 27, pancytopenia (WBC 2.8, hemoglobin 8.7, platelets of 141), negative COVID-19 test, potassium >7.5 with T wave peaking on EKG, bicarbonate 19, creatinine 9.54, BUN 95, temperature normal, blood pressure 185/101,  heart rate 84, RR 22, oxygen saturation 100% on room air.  Chest x-ray showed vascular congestion.  Patient is placed on telemetry bed for observation.  Dr. Carolin Sicks of nephrology was consulted for urgent dialysis.    Hospital Course:    53 y.o.femalewith history of bipolar disorder, CHF, anxiety depression, chronic pain syndrome, schizoaffective disorder, [facility] Admitted with hyperkalemia (K= > 7.5) with EKG changes after missed hemodialysis session . Pt had emergency hemodialysis overnight -Potassium is normalized  ---Hyperkalemia and uremia and ESRD-HD: potassium was 7.5 with T wave peaking on EKG on admission, bicarbonate was 19, creatinine 9.54, BUN 95. This is due to noncompliance to dialysis. --Patient received hyperkalemia cocktail --Status post urgent/emergency hemodialysis --- Repeat potassium 5.4 nephrologist recommends Lokelma x 1 dose and  discharged home  Chronic combined systolic (congestive) and diastolic (congestive) heart failure: pt has fluid overload and vascular congestion on chest x-ray.  This is due to dialysis noncompliance.  2D echo on 01/16/2016 showed EF of 45 to 60% with grade 2 diastolic  dysfunction. -Volume status improved after hemodialysis  Chest pain and elevated troponin: Troponin 27.  Most likely due to demand ischemia secondary to fluid overload. - prn Nitroglycerin, and lipitor  - -Currently chest pain-free  HTN:  -Continue home medications: Labetalol  Anemia of chronic kidney failure/ESRD: Hgb 8.7. History of GI bleed: Received Mircera and iron on 7/18.  -continue iron supplement-- EPO agent as per nephrology team  Type II diabetes mellitus with renal manifestations ([place]): Last A1c 5.9 on 11/17/18, well controled. Patient is taking NovoLog and Lantus at home -  Hx of TIA (transient ischemic attack): -Lipitor, no antiplatelet agents due to recent GI bleed  GERD (gastroesophageal reflux disease): -Protonix  Tobacco abuse: --- Patient not interested in smoking cessation  Discharge Condition: stable  Follow UP--resume usual hemodialysis schedule     Consults obtained -nephrology  Diet and Activity recommendation:  As advised  Discharge Instructions    Discharge Instructions    Call MD for:  difficulty breathing, headache or visual disturbances   Complete by: As directed    Call MD for:  persistant dizziness or light-headedness   Complete by: As directed    Call MD for:  severe uncontrolled pain   Complete by: As directed    Call MD for:  temperature >100.4   Complete by: As directed    Diet - low sodium heart healthy   Complete by: As directed    Discharge instructions   Complete by: As directed    1)Very low-salt diet advised 2)Weigh yourself daily, call if you gain more than 3 pounds in 1 day or more than 5 pounds in 1 week as your diuretic medications may need to be adjusted 3)Limit your Fluid  intake to no more than 50 ounces (1.5 Liters) per day 4) continue outpatient hemodialysis on Tuesdays Thursdays and Saturdays-your next hemodialysis session should be tomorrow Saturday, 02/27/2019 5) follow-up with your primary care physician  and your nephrologist as previously scheduled 6) compliance with hemodialysis schedule and compliance weight your medications strongly advised   Increase activity slowly   Complete by: As directed         Discharge Medications     Allergies as of 02/26/2019      Reactions   Penicillins Hives, Other (See Comments)   Has patient had a PCN reaction causing immediate rash, facial/tongue/throat swelling, SOB or lightheadedness with hypotension: Yes Has patient had a PCN reaction causing severe  rash involving mucus membranes or skin necrosis: No Has patient had a PCN reaction that required hospitalization No Has patient had a PCN reaction occurring within the last 10 years: No If all of the above answers are "NO", then may proceed with Cephalosporin use. Pt tolerates cefepime and ceftriaxone   Gabapentin Itching, Swelling   Lyrica [pregabalin] Swelling, Other (See Comments)   Facial and leg swelling   Saphris [asenapine] Swelling, Other (See Comments)   Facial swelling   Tramadol Swelling, Other (See Comments)   Leg swelling   Adhesive [tape] Itching   SEVERE ITCHING   Latex Itching   SEVERE ITCHING   Other Other (See Comments)   NO BLOOD PRODUCTS; PATIENT IS A JEHOVAH'S WITNESS      Medication List    STOP taking these medications   docusate sodium 100 MG capsule Commonly known as: COLACE   ferric gluconate 125 mg in sodium chloride 0.9 % 100 mL   saccharomyces boulardii 250 MG capsule Commonly known as: FLORASTOR   senna 8.6 MG tablet Commonly known as: SENOKOT     TAKE these medications   acetaminophen 325 MG tablet Commonly known as: TYLENOL Take 2 tablets (650 mg total) by mouth every 6 (six) hours as needed for fever, headache or moderate pain.   albuterol 108 (90 Base) MCG/ACT inhaler Commonly known as: VENTOLIN HFA Inhale 2 puffs into the lungs every 4 (four) hours as needed for wheezing or shortness of breath.   allopurinol 100 MG tablet Commonly known  as: ZYLOPRIM Take 2 tablets (200 mg total) by mouth daily. Notes to patient: 7/25 am   atorvastatin 40 MG tablet Commonly known as: LIPITOR Take 1 tablet (40 mg total) by mouth at bedtime. Notes to patient: 7/24 pm   calcitRIOL 0.25 MCG capsule Commonly known as: ROCALTROL Take 4 capsules (1 mcg total) by mouth every dialysis (3 times per week on TTS). What changed: how much to take Notes to patient: 7/25   Darbepoetin Alfa 200 MCG/0.4ML Sosy injection Commonly known as: ARANESP Inject 0.4 mLs (200 mcg total) into the vein every Wednesday with hemodialysis. Notes to patient: 7/29   diphenhydrAMINE 25 mg capsule Commonly known as: BENADRYL Take 1 capsule (25 mg total) by mouth every 6 (six) hours as needed for up to 8 days for itching.   DULoxetine 30 MG capsule Commonly known as: CYMBALTA Take 3 capsules (90 mg total) by mouth daily. Notes to patient: 7/25 am   ferric citrate 1 GM 210 MG(Fe) tablet Commonly known as: Auryxia Take 2 tablets (420 mg total) by mouth 3 (three) times daily with meals. Notes to patient: 1/61 pm   folic acid 1 MG tablet Commonly known as: FOLVITE Take 1 tablet (1 mg total) by mouth daily. Notes to patient: 7/25 am   hydrocortisone 2.5 % rectal cream Commonly known as: ANUSOL-HC Place rectally 2 (two) times daily. What changed: how much to take Notes to patient: 7/24 pm   hydrOXYzine 25 MG tablet Commonly known as: ATARAX/VISTARIL Take 1 tablet (25 mg total) by mouth 3 (three) times daily as needed for itching or anxiety.   insulin aspart 100 UNIT/ML injection Commonly known as: novoLOG 0-9 Units, Subcutaneous, 3 times daily with meals CBG < 70: implement hypoglycemia protocol CBG 70 - 120: 0 units CBG 121 - 150: 1 unit CBG 151 - 200: 2 units CBG 201 - 250: 3 units CBG 251 - 300: 5 units CBG 301 - 350: 7 units CBG 351 - 400:  9 units CBG > 400: call MD   insulin glargine 100 UNIT/ML injection Commonly known as: LANTUS Inject 0.2 mLs (20  Units total) into the skin at bedtime for 30 days.   labetalol 200 MG tablet Commonly known as: NORMODYNE Take 1 tablet (200 mg total) by mouth 2 (two) times daily. Notes to patient: 7/24 pm   lidocaine-prilocaine cream Commonly known as: EMLA Apply 1 application topically See admin instructions. On dialysis days   multivitamin Tabs tablet Take 1 tablet by mouth at bedtime.   mupirocin ointment 2 % Commonly known as: BACTROBAN Apply 1 application topically as needed (on arm).   oxymetazoline 0.05 % nasal spray Commonly known as: AFRIN Place 2 sprays into both nostrils 2 (two) times daily.   pantoprazole 40 MG tablet Commonly known as: PROTONIX Take 1 tablet (40 mg total) by mouth daily at 6 (six) AM for 30 days.   senna-docusate 8.6-50 MG tablet Commonly known as: Senokot-S Take 2 tablets by mouth at bedtime.   traZODone 50 MG tablet Commonly known as: DESYREL Take 0.5 tablets (25 mg total) by mouth at bedtime.       Major procedures and Radiology Reports - PLEASE review detailed and final reports for all details, in brief -   Dg Chest 2 View  Result Date: 02/24/2019 CLINICAL DATA:  Chest pain and shortness of breath, recently missed dialysis episode EXAM: CHEST - 2 VIEW COMPARISON:  03/17/2018 FINDINGS: Cardiac shadow is enlarged but stable consistent with a degree of volume overload. Mild vascular congestion is again seen without significant edema. Chronic blunting of left costophrenic angle is noted. No focal infiltrate is seen. IMPRESSION: Mild vascular congestion consistent with the recently missed dialysis episode. Chronic changes in the left base. Electronically Signed   By: Inez Catalina M.D.   On: 02/24/2019 23:25    Micro Results    Recent Results (from the past 240 hour(s))  SARS Coronavirus 2 (CEPHEID - Performed in [place] hospital lab), Hosp Order     Status: None   Collection Time: 02/25/19 12:11 AM   Specimen: Nasopharyngeal Swab  Result Value Ref  Range Status   SARS Coronavirus 2 NEGATIVE NEGATIVE Final    Comment: (NOTE) If result is NEGATIVE SARS-CoV-2 target nucleic acids are NOT DETECTED. The SARS-CoV-2 RNA is generally detectable in upper and lower  respiratory specimens during the acute phase of infection. The lowest  concentration of SARS-CoV-2 viral copies this assay can detect is 250  copies / mL. A negative result does not preclude SARS-CoV-2 infection  and should not be used as the sole basis for treatment or other  patient management decisions.  A negative result may occur with  improper specimen collection / handling, submission of specimen other  than nasopharyngeal swab, presence of viral mutation(s) within the  areas targeted by this assay, and inadequate number of viral copies  (<250 copies / mL). A negative result must be combined with clinical  observations, patient history, and epidemiological information. If result is POSITIVE SARS-CoV-2 target nucleic acids are DETECTED. The SARS-CoV-2 RNA is generally detectable in upper and lower  respiratory specimens dur ing the acute phase of infection.  Positive  results are indicative of active infection with SARS-CoV-2.  Clinical  correlation with patient history and other diagnostic information is  necessary to determine patient infection status.  Positive results do  not rule out bacterial infection or co-infection with other viruses. If result is PRESUMPTIVE POSTIVE SARS-CoV-2 nucleic acids MAY BE PRESENT.  A presumptive positive result was obtained on the submitted specimen  and confirmed on repeat testing.  While 2019 novel coronavirus  (SARS-CoV-2) nucleic acids may be present in the submitted sample  additional confirmatory testing may be necessary for epidemiological  and / or clinical management purposes  to differentiate between  SARS-CoV-2 and other Sarbecovirus currently known to infect humans.  If clinically indicated additional testing with an  alternate test  methodology [id number]) is advised. The SARS-CoV-2 RNA is generally  detectable in upper and lower respiratory sp ecimens during the acute  phase of infection. The expected result is Negative. Fact Sheet for Patients:  StrictlyIdeas.no Fact Sheet for Healthcare Providers: BankingDealers.co.za This test is not yet approved or cleared by the [place] FDA and has been authorized for detection and/or diagnosis of SARS-CoV-2 by FDA under an Emergency Use Authorization (EUA).  This EUA will remain in effect (meaning this test can be used) for the duration of the COVID-19 declaration under Section 564(b)(1) of the Act, 21 U.S.C. section 360bbb-3(b)(1), unless the authorization is terminated or revoked sooner. Performed at [facility], [place] [address] [place], [place] [address]        Today   Subjective    Mansi Tokar today has no new complaints, but nephrologist okay to discharge home  No chest pains          Patient has been seen and examined prior to discharge   Objective   Blood pressure (!) 157/65, pulse 83, temperature 98.3 F (36.8 C), temperature source Oral, resp. rate 16, height 5\' 4"  (1.626 m), weight 72.5 kg, SpO2 93 %.   Intake/Output Summary (Last 24 hours) at 02/26/2019 1300 Last data filed at 02/26/2019 1239 Gross per 24 hour  Intake 540 ml  Output 0 ml  Net 540 ml    Exam Gen:- Awake Alert, no acute distress  HEENT:- .AT, No sclera icterus Neck-Supple Neck,No JVD,.  Lungs-  CTAB , good air movement bilaterally  CV- S1, S2 normal, regular Abd-  +ve B.Sounds, Abd Soft, No tenderness,    Extremity/Skin:- No  edema,   good pulses Psych-affect is appropriate, oriented x3 Neuro-no new focal deficits, no tremors  MSK-left upper extremity AV graft with positive thrill and bruit   Data Review   CBC w Diff:  Lab Results  Component Value Date   WBC 2.0 (L) 02/26/2019   HGB 7.6 (L)  02/26/2019   HGB 9.3 (L) 07/20/2013   HCT 24.7 (L) 02/26/2019   HCT 28.8 (L) 07/20/2013   PLT 105 (L) 02/26/2019   PLT 271 07/20/2013   LYMPHOPCT 17 02/24/2019   LYMPHOPCT 45.4 07/20/2013   BANDSPCT 0 10/05/2018   MONOPCT 8 02/24/2019   MONOPCT 6.4 07/20/2013   EOSPCT 2 02/24/2019   EOSPCT 1.6 07/20/2013   BASOPCT 0 02/24/2019   BASOPCT 0.7 07/20/2013    CMP:  Lab Results  Component Value Date   NA 137 02/26/2019   NA 130 (A) 12/13/2015   K 5.4 (H) 02/26/2019   CL 96 (L) 02/26/2019   CO2 26 02/26/2019   BUN 52 (H) 02/26/2019   BUN 68 (A) 12/13/2015   CREATININE 6.42 (H) 02/26/2019   GLU 129 12/13/2015   PROT 7.4 02/25/2019   ALBUMIN 3.4 (L) 02/25/2019   BILITOT 0.9 02/25/2019   ALKPHOS 111 02/25/2019   AST 35 02/25/2019   ALT 37 02/25/2019  .   Total Discharge time is about 33 minutes  Roxan Hockey M.D on 02/26/2019  at 1:00 PM  Go to [email or web address] -  for contact info  [facility] - Office  [phone]

## 2019-02-26 NOTE — Progress Notes (Signed)
Pt refused morning weight. Pt in no acute distress. Will continue to monitor.

## 2019-02-26 NOTE — TOC Transition Note (Signed)
Transition of Care [facility]) - CM/SW Discharge Note   Patient Details  Name: [name] MRN: [id number] Date of Birth: 1965-09-25  [facility] CM/SW Contact:  Zenon Mayo, RN Phone Number: 02/26/2019, 1:15 PM   Clinical Narrative:    From home alone, she states she has HHPT with [facility] and would like to continue, NCM informed MD of this information, contacted [place] to confirm if still active, he states not active they just signed off but they will take her back to get more therapy.  Soc will begin 24-48 hrs post dc.     Final next level of care: [place] Barriers to Discharge: No Barriers Identified   Patient Goals and CMS Choice Patient states their goals for this hospitalization and ongoing recovery are:: change diet , to be healthier, continue with therapy CMS Medicare.gov Compare Post Acute Care list provided to:: Patient Choice offered to / list presented to : Patient  Discharge Placement                       Discharge Plan and Services   Discharge Planning Services: CM Consult Post Acute Care Choice: Home Health          DME Arranged: (NA)         HH Arranged: PT HH Agency: [place] ([facility]) Date HH Agency Contacted: 02/26/19 Time [place]: [place] Representative spoke with at [place]: [place] ([place]) Interventions     Readmission Risk Interventions Readmission Risk Prevention Plan 01/21/2019 11/17/2018  Transportation Screening Complete -  Medication Review Press photographer) Complete -  PCP or Specialist appointment within 3-5 days of discharge Complete -  [place] or Home Care Consult Complete -  SW Recovery Care/Counseling Consult - Complete  Palliative Care Screening Not Applicable -  [place] Not Applicable -  Some recent data might be hidden

## 2019-02-26 NOTE — Progress Notes (Signed)
[facility] Progress Note  Subjective: feeling better, no c/o, K+ 5.4 today.  Going home I believe  Vitals:   02/26/19 0540 02/26/19 0745 02/26/19 0914 02/26/19 0923  BP: (!) 166/80 (!) 178/92 (!) 157/65   Pulse:  89 85   Resp:  17    Temp:  98.4 F (36.9 C)    TempSrc:  Oral    SpO2:  100% 98%   Weight:    72.5 kg  Height:        Inpatient medications: . allopurinol  200 mg Oral Daily  . atorvastatin  40 mg Oral QHS  . Chlorhexidine Gluconate Cloth  6 each Topical Q0600  . DULoxetine  90 mg Oral Daily  . ferric citrate  420 mg Oral TID WC  . folic acid  1 mg Oral Daily  . heparin  5,000 Units Subcutaneous Q8H  . insulin aspart  0-9 Units Subcutaneous TID WC  . insulin glargine  15 Units Subcutaneous QHS  . labetalol  200 mg Oral BID  . multivitamin  1 tablet Oral QHS  . nicotine  21 mg Transdermal Daily  . oxymetazoline  2 spray Each Nare BID  . pantoprazole  40 mg Oral Q0600  . polyethylene glycol  17 g Oral Daily  . saccharomyces boulardii  250 mg Oral Daily  . sodium zirconium cyclosilicate  10 g Oral Once  . traZODone  50 mg Oral QHS    acetaminophen, albuterol, calcitRIOL, dextromethorphan-guaiFENesin, hydrALAZINE, hydrOXYzine, nitroGLYCERIN, senna-docusate Exam:  alert no distress  no jvd  Chest cta bilat   Cor reg    Abd soft ntnd   Ext L avg+bruit  NF Ox 3  Dialysis: [facility]  EDW 72 kg. HD Bath 2K, 2 ca, Time 3.5 hr,  Mircera 225 mcg on 7/18 Iron 50 mg on 7/18  Access Left AVG  Assess/Plan:  Hyperkalemia: K>7.5 due to missed dialysis. Eating a lot of potato products, which surprisingly she says she didn't know are associated w/ high K+ levels in CKD patients. OK for dc today, go to usual OP HD tomorrow.   Shortness of breath: some vol overload, no edema on CXR.  4L off yest, SpO2 OK today and no vol overload on exam.  Does not need HD today.  OK for discharge.    ESRD: TTS HD. As above.   Anemia of ESRD: History of GI bleed:  Received Mircera and iron on 7/18.  Monitor CBC  Metabolic Bone Disease: Monitor calcium phosphorus.  Recommend to resume calcitriol.   [facility] 02/26/2019, 10:32 AM  Iron/TIBC/Ferritin/ %Sat    Component Value Date/Time   IRON 84 01/21/2019 1030   TIBC 323 01/21/2019 1030   FERRITIN 1,093 (H) 01/21/2019 1030   IRONPCTSAT 26 01/21/2019 1030   Recent Labs  Lab 02/25/19 1457 02/26/19 0850  NA 135 137  K 5.2* 5.4*  CL 94* 96*  CO2 25 26  GLUCOSE 178* 149*  BUN 33* 52*  CREATININE 4.93* 6.42*  CALCIUM 9.1 9.3  PHOS 4.0  --   ALBUMIN 3.4*  --    Recent Labs  Lab 02/25/19 0653  AST 35  ALT 37  ALKPHOS 111  BILITOT 0.9  PROT 7.4   Recent Labs  Lab 02/25/19 0741  WBC 2.2*  HGB 8.1*  HCT 24.8*  PLT 156

## 2019-02-26 NOTE — Progress Notes (Signed)
Patient ambulated in the hallway at room air with oxygen saturation of 93%.  Patient states feeling winded if she walks long distances and at night.  Patient states that she wants oxygen to have at home to use whenever she feels short of breath.  Patient is currently lying in her bed at room air with oxygen saturation of 98%.

## 2019-02-26 NOTE — TOC Initial Note (Signed)
Transition of Care [facility]) - Initial/Assessment Note    Patient Details  Name: [name] DOLEZAL MRN: [id number] Date of Birth: 08-26-1965  [facility] CM/SW Contact:    Zenon Mayo, RN Phone Number: 02/26/2019, 1:11 PM  Clinical Narrative:                 From home alone, she states she has HHPT with [facility] and would like to continue, NCM informed MD of this information, contacted [place] to confirm if still active, he states not active they just signed off but they will take her back to get more therapy.  Soc will begin 24-48 hrs post dc.   Expected Discharge Plan: [place] Barriers to Discharge: No Barriers Identified   Patient Goals and CMS Choice Patient states their goals for this hospitalization and ongoing recovery are:: to get better CMS Medicare.gov Compare Post Acute Care list provided to:: Patient Choice offered to / list presented to : Patient  Expected Discharge Plan and Services Expected Discharge Plan: [place]   Discharge Planning Services: CM Consult Post Acute Care Choice: [place] arrangements for the past 2 months: Apartment Expected Discharge Date: 02/26/19               DME Arranged: (NA)         HH Arranged: PT HH Agency: [facility] ([place]) Date HH Agency Contacted: 02/26/19 Time [place]: 2 Representative spoke with at [place]: [place] Arrangements/Services Living arrangements for the past 2 months: [place] with:: Self Patient language and need for interpreter reviewed:: Yes Do you feel safe going back to the place where you live?: Yes      Need for Family Participation in Patient Care: No (Comment) Care giver support system in place?: No (comment) Current home services: Home PT Criminal Activity/Legal Involvement Pertinent to Current Situation/Hospitalization: No - Comment as needed  Activities of Daily Living Home Assistive  Devices/Equipment: Walker (specify type), Raised toilet seat with rails ADL Screening (condition at time of admission) Patient's cognitive ability adequate to safely complete daily activities?: Yes Is the patient deaf or have difficulty hearing?: No Does the patient have difficulty seeing, even when wearing glasses/contacts?: No Does the patient have difficulty concentrating, remembering, or making decisions?: No Patient able to express need for assistance with ADLs?: Yes Does the patient have difficulty dressing or bathing?: No Independently performs ADLs?: Yes (appropriate for developmental age) Does the patient have difficulty walking or climbing stairs?: Yes Weakness of Legs: Both Weakness of Arms/Hands: None  Permission Sought/Granted                  Emotional Assessment Appearance:: Appears stated age Attitude/Demeanor/Rapport: Gracious Affect (typically observed): Appropriate Orientation: : Oriented to Self, Oriented to Place, Oriented to  Time, Oriented to Situation Alcohol / Substance Use: Not Applicable Psych Involvement: No (comment)  Admission diagnosis:  Hyperkalemia [E87.5] End-stage renal disease needing dialysis ([place]) [N18.6, Z99.2] Chest pain, unspecified type [R07.9] Patient Active Problem List   Diagnosis Date Noted  . Type II diabetes mellitus with renal manifestations ([place]) 02/25/2019  . TIA (transient ischemic attack) 02/25/2019  . GERD (gastroesophageal reflux disease) 02/25/2019  . Tobacco abuse 02/25/2019  . Fluid overload 02/25/2019  . Uremia 02/25/2019  . Melena   . Rectal bleed 12/30/2018  . Gastritis and gastroduodenitis   . Benign neoplasm of ascending colon   . Benign neoplasm of transverse colon   .  Benign neoplasm of descending colon   . GI bleeding 11/17/2018  . Acute blood loss anemia   . No blood products   . GI bleed 10/05/2018  . Prolonged QT interval 10/05/2018  . Localized edema due to fluid overload 09/05/2017  . Lower GI  bleed   . Evaluation by psychiatric service required 07/17/2017  . Volume overload 06/03/2017  . Right supracondylar humerus fracture, with nonunion, subsequent encounter 05/01/2017  . Malnutrition of moderate degree 02/28/2017  . Leg pain, left 02/25/2017  . Left leg pain 02/25/2017  . External hemorrhoids   . Diverticulosis of colon without hemorrhage   . Goals of care, counseling/discussion   . Palliative care encounter   . Cellulitis of left leg 01/16/2017  . Upper GI bleed 01/16/2017  . UGIB (upper gastrointestinal bleed)   . Anemia due to chronic kidney disease   . Cellulitis of left thigh 01/01/2017  . ESRD on dialysis ([place]) 01/01/2017  . Cocaine use disorder, severe, dependence ([place]) 08/29/2016  . Hepatitis C 08/29/2016  . Diabetic ulcer of calf ([place]) 08/29/2016  . Diabetic ulcer of toe ([place]) 08/29/2016  . Cerebrovascular accident (CVA) ([place]) 08/29/2016  . MDD (major depressive disorder) 08/28/2016  . Dyslipidemia associated with type 2 diabetes mellitus ([place]) 11/10/2015  . Hyperkalemia 09/17/2015  . History of vitamin D deficiency 06/20/2015  . Long term prescription opiate use 06/20/2015  . Chronic pain syndrome 06/19/2015  . Anemia of chronic kidney failure 05/01/2015  . transmetatarsal amputation of the left foot 12/13/2014  . Unilateral visual loss 11/22/2014  . 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 ([place]) 11/22/2014  . Elevated troponin 11/09/2014  . Essential hypertension 11/09/2014  . Dizziness   . Chronic combined systolic (congestive) and diastolic (congestive) heart failure ([place])   . Chest pain 08/25/2014  . Rectal bleeding 04/23/2014  . Anemia, iron deficiency 04/13/2014  . Noncompliance 02/02/2014  . Hyponatremia 05/19/2013  . Seizure disorder ([place]) 02/09/2013   PCP:  Rocco Serene, MD Pharmacy:   [facility] [place], [place] - [address] AT [place] [place]  [address] Phone: [phone] Fax: [phone]     Social Determinants of Health (SDOH) Interventions    Readmission Risk Interventions Readmission Risk Prevention Plan 01/21/2019 11/17/2018  Transportation Screening Complete -  Medication Review (RN Care Manager) Complete -  PCP or Specialist appointment within 3-5 days of discharge Complete -  [place] or Home Care Consult Complete -  SW Recovery Care/Counseling Consult - Complete  Palliative Care Screening Not Applicable -  [place] Not Applicable -  Some recent data might be hidden

## 2019-02-26 NOTE — Progress Notes (Signed)
Pt was weaned  to room air. Will ambulate patient in 30 minutes to check oxygen saturation.  Awaiting on CBC results.  02/26/19 0914  Vitals  BP (!) 157/65  MAP (mmHg) 90  BP Method Automatic  Pulse Rate 85  Oxygen Therapy  SpO2 98 %  O2 Device Room Air  MEWS Score  MEWS RR 0  MEWS Pulse 0  MEWS Systolic 0  MEWS LOC 0  MEWS Temp 0  MEWS Score 0  MEWS Score Color [place]

## 2019-02-26 NOTE — Discharge Instructions (Signed)
1)Very low-salt diet advised 2)Weigh yourself daily, call if you gain more than 3 pounds in 1 day or more than 5 pounds in 1 week as your diuretic medications may need to be adjusted 3)Limit your Fluid  intake to no more than 50 ounces (1.5 Liters) per day 4) continue outpatient hemodialysis on Tuesdays Thursdays and Saturdays-your next hemodialysis session should be tomorrow Saturday, 02/27/2019 5) follow-up with your primary care physician and your nephrologist as previously scheduled 6) compliance with hemodialysis schedule and compliance weight your medications strongly advised

## 2019-03-10 ENCOUNTER — Inpatient Hospital Stay (HOSPITAL_COMMUNITY)
Admission: EM | Admit: 2019-03-10 | Discharge: 2019-03-12 | DRG: 640 | Disposition: A | Discharge status: Home Health | Payer: Medicare Other | Attending: Family Medicine | Admitting: Family Medicine

## 2019-03-10 DIAGNOSIS — Z8249 Family history of ischemic heart disease and other diseases of the circulatory system: Secondary | ICD-10-CM

## 2019-03-10 DIAGNOSIS — F419 Anxiety disorder, unspecified: Secondary | ICD-10-CM | POA: Diagnosis present

## 2019-03-10 DIAGNOSIS — Z79899 Other long term (current) drug therapy: Secondary | ICD-10-CM

## 2019-03-10 DIAGNOSIS — G40909 Epilepsy, unspecified, not intractable, without status epilepticus: Secondary | ICD-10-CM | POA: Diagnosis present

## 2019-03-10 DIAGNOSIS — E8779 Other fluid overload: Secondary | ICD-10-CM | POA: Diagnosis not present

## 2019-03-10 DIAGNOSIS — E785 Hyperlipidemia, unspecified: Secondary | ICD-10-CM | POA: Diagnosis present

## 2019-03-10 DIAGNOSIS — K921 Melena: Secondary | ICD-10-CM | POA: Diagnosis not present

## 2019-03-10 DIAGNOSIS — E8889 Other specified metabolic disorders: Secondary | ICD-10-CM | POA: Diagnosis present

## 2019-03-10 DIAGNOSIS — Z22322 Carrier or suspected carrier of Methicillin resistant Staphylococcus aureus: Secondary | ICD-10-CM

## 2019-03-10 DIAGNOSIS — E1142 Type 2 diabetes mellitus with diabetic polyneuropathy: Secondary | ICD-10-CM | POA: Diagnosis present

## 2019-03-10 DIAGNOSIS — G9349 Other encephalopathy: Secondary | ICD-10-CM | POA: Diagnosis present

## 2019-03-10 DIAGNOSIS — Z8614 Personal history of Methicillin resistant Staphylococcus aureus infection: Secondary | ICD-10-CM

## 2019-03-10 DIAGNOSIS — Z9115 Patient's noncompliance with renal dialysis: Secondary | ICD-10-CM

## 2019-03-10 DIAGNOSIS — Z885 Allergy status to narcotic agent status: Secondary | ICD-10-CM

## 2019-03-10 DIAGNOSIS — D500 Iron deficiency anemia secondary to blood loss (chronic): Secondary | ICD-10-CM | POA: Diagnosis present

## 2019-03-10 DIAGNOSIS — Z888 Allergy status to other drugs, medicaments and biological substances status: Secondary | ICD-10-CM

## 2019-03-10 DIAGNOSIS — K219 Gastro-esophageal reflux disease without esophagitis: Secondary | ICD-10-CM | POA: Diagnosis present

## 2019-03-10 DIAGNOSIS — F17210 Nicotine dependence, cigarettes, uncomplicated: Secondary | ICD-10-CM | POA: Diagnosis present

## 2019-03-10 DIAGNOSIS — N19 Unspecified kidney failure: Secondary | ICD-10-CM | POA: Diagnosis not present

## 2019-03-10 DIAGNOSIS — Z20828 Contact with and (suspected) exposure to other viral communicable diseases: Secondary | ICD-10-CM | POA: Diagnosis present

## 2019-03-10 DIAGNOSIS — N186 End stage renal disease: Secondary | ICD-10-CM | POA: Diagnosis present

## 2019-03-10 DIAGNOSIS — F05 Delirium due to known physiological condition: Secondary | ICD-10-CM | POA: Diagnosis present

## 2019-03-10 DIAGNOSIS — D631 Anemia in chronic kidney disease: Secondary | ICD-10-CM | POA: Diagnosis present

## 2019-03-10 DIAGNOSIS — Z89422 Acquired absence of other left toe(s): Secondary | ICD-10-CM

## 2019-03-10 DIAGNOSIS — Z992 Dependence on renal dialysis: Secondary | ICD-10-CM

## 2019-03-10 DIAGNOSIS — E875 Hyperkalemia: Secondary | ICD-10-CM | POA: Diagnosis present

## 2019-03-10 DIAGNOSIS — Z880 Allergy status to penicillin: Secondary | ICD-10-CM

## 2019-03-10 DIAGNOSIS — N2581 Secondary hyperparathyroidism of renal origin: Secondary | ICD-10-CM | POA: Diagnosis present

## 2019-03-10 DIAGNOSIS — K625 Hemorrhage of anus and rectum: Secondary | ICD-10-CM

## 2019-03-10 DIAGNOSIS — G894 Chronic pain syndrome: Secondary | ICD-10-CM | POA: Diagnosis present

## 2019-03-10 DIAGNOSIS — Z91040 Latex allergy status: Secondary | ICD-10-CM

## 2019-03-10 DIAGNOSIS — Z531 Procedure and treatment not carried out because of patient's decision for reasons of belief and group pressure: Secondary | ICD-10-CM | POA: Diagnosis not present

## 2019-03-10 DIAGNOSIS — D638 Anemia in other chronic diseases classified elsewhere: Secondary | ICD-10-CM | POA: Diagnosis present

## 2019-03-10 DIAGNOSIS — I132 Hypertensive heart and chronic kidney disease with heart failure and with stage 5 chronic kidney disease, or end stage renal disease: Secondary | ICD-10-CM | POA: Diagnosis present

## 2019-03-10 DIAGNOSIS — K645 Perianal venous thrombosis: Secondary | ICD-10-CM | POA: Diagnosis present

## 2019-03-10 DIAGNOSIS — B1920 Unspecified viral hepatitis C without hepatic coma: Secondary | ICD-10-CM | POA: Diagnosis present

## 2019-03-10 DIAGNOSIS — Z79891 Long term (current) use of opiate analgesic: Secondary | ICD-10-CM

## 2019-03-10 DIAGNOSIS — Z8673 Personal history of transient ischemic attack (TIA), and cerebral infarction without residual deficits: Secondary | ICD-10-CM

## 2019-03-10 DIAGNOSIS — Z91048 Other nonmedicinal substance allergy status: Secondary | ICD-10-CM

## 2019-03-10 DIAGNOSIS — E1122 Type 2 diabetes mellitus with diabetic chronic kidney disease: Secondary | ICD-10-CM | POA: Diagnosis present

## 2019-03-10 DIAGNOSIS — F319 Bipolar disorder, unspecified: Secondary | ICD-10-CM | POA: Diagnosis present

## 2019-03-10 DIAGNOSIS — Z90710 Acquired absence of both cervix and uterus: Secondary | ICD-10-CM

## 2019-03-10 DIAGNOSIS — I5042 Chronic combined systolic (congestive) and diastolic (congestive) heart failure: Secondary | ICD-10-CM | POA: Diagnosis present

## 2019-03-10 LAB — CBC WITH DIFFERENTIAL/PLATELET
Abs Immature Granulocytes: 0.02 10*3/uL (ref 0.00–0.07)
Basophils Absolute: 0 10*3/uL (ref 0.0–0.1)
Basophils Relative: 1 %
Eosinophils Absolute: 0.1 10*3/uL (ref 0.0–0.5)
Eosinophils Relative: 2 %
HCT: 26.3 % — ABNORMAL LOW (ref 36.0–46.0)
Hemoglobin: 8 g/dL — ABNORMAL LOW (ref 12.0–15.0)
Immature Granulocytes: 0 %
Lymphocytes Relative: 18 %
Lymphs Abs: 0.9 10*3/uL (ref 0.7–4.0)
MCH: 33.5 pg (ref 26.0–34.0)
MCHC: 30.4 g/dL (ref 30.0–36.0)
MCV: 110 fL — ABNORMAL HIGH (ref 80.0–100.0)
Monocytes Absolute: 0.5 10*3/uL (ref 0.1–1.0)
Monocytes Relative: 10 %
Neutro Abs: 3.4 10*3/uL (ref 1.7–7.7)
Neutrophils Relative %: 69 %
Platelets: 155 10*3/uL (ref 150–400)
RBC: 2.39 MIL/uL — ABNORMAL LOW (ref 3.87–5.11)
RDW: 16.9 % — ABNORMAL HIGH (ref 11.5–15.5)
WBC: 4.9 10*3/uL (ref 4.0–10.5)
nRBC: 0.4 % — ABNORMAL HIGH (ref 0.0–0.2)

## 2019-03-10 LAB — I-STAT BETA HCG BLOOD, ED (MC, WL, AP ONLY): I-stat hCG, quantitative: 6.6 m[IU]/mL — ABNORMAL HIGH (ref <5)

## 2019-03-10 LAB — GLUCOSE, CAPILLARY: Glucose-Capillary: 171 mg/dL — ABNORMAL HIGH (ref 70–99)

## 2019-03-10 LAB — COMPREHENSIVE METABOLIC PANEL
ALT: 50 U/L — ABNORMAL HIGH (ref 0–44)
AST: 49 U/L — ABNORMAL HIGH (ref 15–41)
Albumin: 3.7 g/dL (ref 3.5–5.0)
Alkaline Phosphatase: 110 U/L (ref 38–126)
Anion gap: 22 — ABNORMAL HIGH (ref 5–15)
BUN: 110 mg/dL — ABNORMAL HIGH (ref 6–20)
CO2: 24 mmol/L (ref 22–32)
Calcium: 8.8 mg/dL — ABNORMAL LOW (ref 8.9–10.3)
Chloride: 87 mmol/L — ABNORMAL LOW (ref 98–111)
Creatinine, Ser: 10.87 mg/dL — ABNORMAL HIGH (ref 0.44–1.00)
GFR calc Af Amer: 4 mL/min — ABNORMAL LOW (ref >60)
GFR calc non Af Amer: 4 mL/min — ABNORMAL LOW (ref >60)
Glucose, Bld: 168 mg/dL — ABNORMAL HIGH (ref 70–99)
Potassium: 7.3 mmol/L (ref 3.5–5.1)
Sodium: 133 mmol/L — ABNORMAL LOW (ref 135–145)
Total Bilirubin: 1.9 mg/dL — ABNORMAL HIGH (ref 0.3–1.2)
Total Protein: 7.4 g/dL (ref 6.5–8.1)

## 2019-03-10 LAB — HEPATITIS B SURFACE ANTIGEN: Hepatitis B Surface Ag: NEGATIVE

## 2019-03-10 LAB — PROTIME-INR
INR: 1.5 — ABNORMAL HIGH (ref 0.8–1.2)
Prothrombin Time: 17.8 seconds — ABNORMAL HIGH (ref 11.4–15.2)

## 2019-03-10 LAB — SARS CORONAVIRUS 2 (TAT 6-24 HRS): SARS Coronavirus 2: NEGATIVE

## 2019-03-10 LAB — POC OCCULT BLOOD, ED: Fecal Occult Bld: POSITIVE — AB

## 2019-03-10 LAB — MAGNESIUM: Magnesium: 2.5 mg/dL — ABNORMAL HIGH (ref 1.7–2.4)

## 2019-03-10 LAB — MRSA PCR SCREENING: MRSA by PCR: POSITIVE — AB

## 2019-03-10 MED ORDER — ONDANSETRON HCL 4 MG/2ML IJ SOLN
4.0000 mg | Freq: Once | INTRAMUSCULAR | Status: DC
  Filled 2019-03-10: qty 2

## 2019-03-10 MED ORDER — DEXTROSE 50 % IV SOLN: 1.0000 | Freq: Once | INTRAVENOUS | Status: DC

## 2019-03-10 MED ORDER — FERRIC CITRATE 1 GM 210 MG(FE) PO TABS
420.0000 mg | ORAL_TABLET | Freq: Three times a day (TID) | ORAL | Status: DC
  Administered 2019-03-12 (×2): 420 mg via ORAL
  Filled 2019-03-10 (×3): qty 2

## 2019-03-10 MED ORDER — CALCITRIOL 0.5 MCG PO CAPS
2.2500 ug | ORAL_CAPSULE | ORAL | Status: DC
  Administered 2019-03-11: 2.25 ug via ORAL
  Filled 2019-03-10: qty 1

## 2019-03-10 MED ORDER — DULOXETINE HCL 60 MG PO CPEP
60.0000 mg | ORAL_CAPSULE | Freq: Every day | ORAL | Status: DC
  Administered 2019-03-11 – 2019-03-12 (×2): 60 mg via ORAL
  Filled 2019-03-10 (×2): qty 1

## 2019-03-10 MED ORDER — CHLORHEXIDINE GLUCONATE CLOTH 2 % EX PADS: 6.0000 | MEDICATED_PAD | Freq: Every day | CUTANEOUS | Status: DC

## 2019-03-10 MED ORDER — HYDROCORTISONE (PERIANAL) 2.5 % EX CREA
1.0000 "application " | TOPICAL_CREAM | Freq: Two times a day (BID) | CUTANEOUS | Status: DC
  Filled 2019-03-10: qty 28.35

## 2019-03-10 MED ORDER — LABETALOL HCL 200 MG PO TABS
200.0000 mg | ORAL_TABLET | Freq: Two times a day (BID) | ORAL | Status: DC
  Administered 2019-03-10 – 2019-03-12 (×4): 200 mg via ORAL
  Filled 2019-03-10 (×5): qty 1

## 2019-03-10 MED ORDER — INSULIN ASPART 100 UNIT/ML IV SOLN: 5.0000 [IU] | Freq: Once | INTRAVENOUS | Status: DC

## 2019-03-10 MED ORDER — PANTOPRAZOLE SODIUM 40 MG PO TBEC
40.0000 mg | DELAYED_RELEASE_TABLET | Freq: Every day | ORAL | Status: DC
  Administered 2019-03-11 – 2019-03-12 (×2): 40 mg via ORAL
  Filled 2019-03-10 (×2): qty 1

## 2019-03-10 MED ORDER — HYDROXYZINE HCL 25 MG PO TABS
25.0000 mg | ORAL_TABLET | Freq: Three times a day (TID) | ORAL | Status: DC | PRN
  Administered 2019-03-10 – 2019-03-11 (×2): 25 mg via ORAL
  Filled 2019-03-10 (×2): qty 1

## 2019-03-10 MED ORDER — CLOTRIMAZOLE 1 % VA CREA
1.0000 | TOPICAL_CREAM | Freq: Every day | VAGINAL | Status: DC
  Administered 2019-03-10 – 2019-03-11 (×2): 1 via VAGINAL
  Filled 2019-03-10: qty 45

## 2019-03-10 MED ORDER — CALCIUM GLUCONATE 10 % IV SOLN: 1.0000 g | Freq: Once | INTRAVENOUS | Status: DC

## 2019-03-10 MED ORDER — INSULIN ASPART 100 UNIT/ML ~~LOC~~ SOLN
0.0000 [IU] | SUBCUTANEOUS | Status: DC
  Administered 2019-03-10 – 2019-03-11 (×2): 2 [IU] via SUBCUTANEOUS

## 2019-03-10 MED ORDER — ATORVASTATIN CALCIUM 40 MG PO TABS
40.0000 mg | ORAL_TABLET | Freq: Every day | ORAL | Status: DC
  Administered 2019-03-10 – 2019-03-11 (×2): 40 mg via ORAL
  Filled 2019-03-10 (×2): qty 1

## 2019-03-10 MED ORDER — TRAZODONE HCL 50 MG PO TABS
25.0000 mg | ORAL_TABLET | Freq: Every day | ORAL | Status: DC
  Administered 2019-03-10 – 2019-03-11 (×2): 25 mg via ORAL
  Filled 2019-03-10 (×2): qty 1

## 2019-03-10 NOTE — Progress Notes (Signed)
Attempted to get report on patient from ED. Patient is in HD.

## 2019-03-10 NOTE — ED Notes (Signed)
Date and time results received: 03/10/19 1:58 PM  Test: K Critical Value: 7.3  Name of Provider Notified: T[place] MD

## 2019-03-10 NOTE — ED Notes (Signed)
Per dialysis do not medicate pt and just bring to bay 4 in dialysis.

## 2019-03-10 NOTE — ED Notes (Signed)
Gave report to [place] and Community education officer at dialysis. Pt needs to be swabbed for COVID and admit orders need to be placed before pt comes up.

## 2019-03-10 NOTE — H&P (Addendum)
History and Physical    [name] ION:[id number] DOB: 1966-01-11 DOA: 03/10/2019  PCP: Rocco Serene, MD Patient coming from: Home  Chief Complaint: GI bleeding  HPI: [name] is a 53 y.o. female with medical history significant of female with medical history significant of ESDR-HD (TTS), hypertension, hyperlipidemia, diabetes mellitus, TIA, GERD, gout, depression, anxiety, bipolar disorder, HCV, anemia, GI bleeding, polysubstance abuse, tobacco abuse, CHF with EF of 45%, who presents with shortness breath and chest pain.  History provided mostly via chart review secondary to patient's mental status.  Per discussion with the EDP, patient presented secondary to 1 day of GI bleeding consisting of maroon-colored stool.  When arrived to the ED, patient developed worsening confusion.  Lab work concerning for uremia.  Also with elevated potassium and peak T waves on EKG.  Nephrology was consulted with plans for emergent hemodialysis.  ED Course: Vitals: Afebrile, normal pulse, normal respirations, blood pressure 170/80, on room air Labs: Potassium 7.3, BUN of 110, Anion gap of 22, Magnesium of 2.5, hemoglobin of 8 Imaging: None obtained Medications/Course: None  Review of Systems: Review of Systems  Unable to perform ROS: Mental status change    Past Medical History:  Diagnosis Date  . Anemia   . Anxiety   . Bipolar disorder, unspecified ([place]) 02/06/2014  . Chronic combined systolic and diastolic CHF (congestive heart failure) (HCC)    EF 40% by echo and 48% by stress test  . Chronic pain syndrome   . Depression   . ESRD on hemodialysis (HCC)    TTS  . GERD (gastroesophageal reflux disease)   . Glaucoma   . Headache    sinus headaches  . Hepatitis C   . High cholesterol   . History of hiatal hernia   . History of vitamin D deficiency 06/20/2015  . Hypertension   . MRSA infection ?2014   "on the back of my head; spread throughout my bloodstream"  . Neuropathy   . Refusal  of blood transfusions as patient is Jehovah's Witness   . Refusal of blood transfusions as patient is Jehovah's Witness   . Schizoaffective disorder, unspecified condition 02/08/2014  . Seizure [facility]) dx'd 2014   "don't know what kind; last one was ~ 04/2014"  . TIA (transient ischemic attack) 2010  . Type II diabetes mellitus ([place])    INSULIN DEPENDENT    Past Surgical History:  Procedure Laterality Date  . ABDOMINAL HYSTERECTOMY  2014  . ABDOMINAL HYSTERECTOMY  2010  . AMPUTATION Left 05/21/2013   Procedure: Left Midfoot AMPUTATION;  Surgeon: Newt Minion, MD;  Location: [place];  Service: Orthopedics;  Laterality: Left;  Left Midfoot Amputation  . AV FISTULA PLACEMENT Left 05/04/2015   Procedure: ARTERIOVENOUS (AV) GRAFT INSERTION;  Surgeon: Angelia Mould, MD;  Location: [place];  Service: Vascular;  Laterality: Left;  . BIOPSY  11/18/2018   Procedure: BIOPSY;  Surgeon: Jerene Bears, MD;  Location: [facility] ENDOSCOPY;  Service: Gastroenterology;;  . COLONOSCOPY WITH PROPOFOL N/A 06/22/2013   Procedure: COLONOSCOPY WITH PROPOFOL;  Surgeon: Jeryl Columbia, MD;  Location: [facility] ENDOSCOPY;  Service: Endoscopy;  Laterality: N/A;  . COLONOSCOPY WITH PROPOFOL N/A 11/18/2018   Procedure: COLONOSCOPY WITH PROPOFOL;  Surgeon: Jerene Bears, MD;  Location: [place];  Service: Gastroenterology;  Laterality: N/A;  . ENTEROSCOPY N/A 12/31/2018   Procedure: ENTEROSCOPY;  Surgeon: Irene Shipper, MD;  Location: [facility] ENDOSCOPY;  Service: Endoscopy;  Laterality: N/A;  Push enteroscopy at 8am (  spoke with Endo)  . ESOPHAGOGASTRODUODENOSCOPY N/A 05/11/2015   Procedure: ESOPHAGOGASTRODUODENOSCOPY (EGD);  Surgeon: Laurence Spates, MD;  Location: [facility] ENDOSCOPY;  Service: Endoscopy;  Laterality: N/A;  . ESOPHAGOGASTRODUODENOSCOPY (EGD) WITH PROPOFOL N/A 11/18/2018   Procedure: ESOPHAGOGASTRODUODENOSCOPY (EGD) WITH PROPOFOL;  Surgeon: Jerene Bears, MD;  Location: [facility] ENDOSCOPY;  Service: Gastroenterology;  Laterality: N/A;  .  EYE SURGERY Bilateral 2010   Lasik  . FLEXIBLE SIGMOIDOSCOPY N/A 01/17/2017   Procedure: FLEXIBLE SIGMOIDOSCOPY w/ hemorrhoid banding possible;  Surgeon: Gatha Mayer, MD;  Location: [facility] ENDOSCOPY;  Service: Endoscopy;  Laterality: N/A;  . FLEXIBLE SIGMOIDOSCOPY N/A 01/21/2019   Procedure: FLEXIBLE SIGMOIDOSCOPY;  Surgeon: Ronald Lobo, MD;  Location: [facility] ENDOSCOPY;  Service: Endoscopy;  Laterality: N/A;  Plan to do the test unprepped, unsedated  . FOOT AMPUTATION Left    due diabetic neuropathy, could not feel ulcer on bottom of foot  . GIVENS CAPSULE STUDY N/A 01/21/2019   Procedure: GIVENS CAPSULE STUDY;  Surgeon: Ronald Lobo, MD;  Location: [facility] ENDOSCOPY;  Service: Endoscopy;  Laterality: N/A;  . POLYPECTOMY  11/18/2018   Procedure: POLYPECTOMY;  Surgeon: Jerene Bears, MD;  Location: [facility] ENDOSCOPY;  Service: Gastroenterology;;  . REFRACTIVE SURGERY Bilateral 2013  . TONSILLECTOMY  1970's  . TUBAL LIGATION  2000     reports that she has been smoking cigarettes. She has a 36.00 pack-year smoking history. She has never used smokeless tobacco. She reports current alcohol use of about 3.0 standard drinks of alcohol per week. She reports previous drug use. Drugs: "Crack" cocaine and Cocaine.  Allergies  Allergen Reactions  . Penicillins Hives and Other (See Comments)    Has patient had a PCN reaction causing immediate rash, facial/tongue/throat swelling, SOB or lightheadedness with hypotension: Yes Has patient had a PCN reaction causing severe rash involving mucus membranes or skin necrosis: No Has patient had a PCN reaction that required hospitalization No Has patient had a PCN reaction occurring within the last 10 years: No If all of the above answers are "NO", then may proceed with Cephalosporin use. Pt tolerates cefepime and ceftriaxone  . Gabapentin Itching and Swelling  . Lyrica [Pregabalin] Swelling and Other (See Comments)    Facial and leg swelling  . Saphris [Asenapine]  Swelling and Other (See Comments)    Facial swelling  . Tramadol Swelling and Other (See Comments)    Leg swelling  . Adhesive [Tape] Itching    SEVERE ITCHING  . Latex Itching    SEVERE ITCHING  . Other Other (See Comments)    NO BLOOD PRODUCTS; PATIENT IS A JEHOVAH'S WITNESS    Family History  Problem Relation Age of Onset  . Hypertension Mother   . Diabetes Mother   . Hypertension Father   . Diabetes Father    None Prior to Admission medications   Medication Sig Start Date End Date Taking? Authorizing Provider  acetaminophen (TYLENOL) 325 MG tablet Take 2 tablets (650 mg total) by mouth every 6 (six) hours as needed for fever, headache or moderate pain. 02/26/19   Roxan Hockey, MD  albuterol (VENTOLIN HFA) 108 (90 Base) MCG/ACT inhaler Inhale 2 puffs into the lungs every 4 (four) hours as needed for wheezing or shortness of breath. 01/26/19   Dahal, Marlowe Aschoff, MD  allopurinol (ZYLOPRIM) 100 MG tablet Take 2 tablets (200 mg total) by mouth daily. 02/26/19 03/28/19  Roxan Hockey, MD  atorvastatin (LIPITOR) 40 MG tablet Take 1 tablet (40 mg total) by mouth at bedtime. 02/26/19   Emokpae,  Courage, MD  calcitRIOL (ROCALTROL) 0.25 MCG capsule Take 4 capsules (1 mcg total) by mouth every dialysis (3 times per week on TTS). 02/26/19   Roxan Hockey, MD  Darbepoetin Alfa (ARANESP) 200 MCG/0.4ML SOSY injection Inject 0.4 mLs (200 mcg total) into the vein every Wednesday with hemodialysis. 01/27/19   Terrilee Croak, MD  diphenhydrAMINE (BENADRYL) 25 mg capsule Take 1 capsule (25 mg total) by mouth every 6 (six) hours as needed for up to 8 days for itching. Patient not taking: Reported on 02/25/2019 01/26/19 02/25/28  Terrilee Croak, MD  DULoxetine (CYMBALTA) 30 MG capsule Take 3 capsules (90 mg total) by mouth daily. 02/26/19 03/28/19  Roxan Hockey, MD  ferric citrate (AURYXIA) 1 GM 210 MG(Fe) tablet Take 2 tablets (420 mg total) by mouth 3 (three) times daily with meals. 02/26/19 03/28/19   Roxan Hockey, MD  folic acid (FOLVITE) 1 MG tablet Take 1 tablet (1 mg total) by mouth daily. 02/26/19 03/28/19  Roxan Hockey, MD  hydrocortisone (ANUSOL-HC) 2.5 % rectal cream Place rectally 2 (two) times daily. Patient taking differently: Place 1 application rectally 2 (two) times daily.  01/26/19   Terrilee Croak, MD  hydrOXYzine (ATARAX/VISTARIL) 25 MG tablet Take 1 tablet (25 mg total) by mouth 3 (three) times daily as needed for itching or anxiety. 02/26/19   Roxan Hockey, MD  insulin aspart (NOVOLOG) 100 UNIT/ML injection 0-9 Units, Subcutaneous, 3 times daily with meals CBG < 70: implement hypoglycemia protocol CBG 70 - 120: 0 units CBG 121 - 150: 1 unit CBG 151 - 200: 2 units CBG 201 - 250: 3 units CBG 251 - 300: 5 units CBG 301 - 350: 7 units CBG 351 - 400: 9 units CBG > 400: call MD 01/26/19   Terrilee Croak, MD  insulin glargine (LANTUS) 100 UNIT/ML injection Inject 0.2 mLs (20 Units total) into the skin at bedtime for 30 days. 01/26/19 02/25/19  Terrilee Croak, MD  labetalol (NORMODYNE) 200 MG tablet Take 1 tablet (200 mg total) by mouth 2 (two) times daily. 02/26/19 03/28/19  Roxan Hockey, MD  lidocaine-prilocaine (EMLA) cream Apply 1 application topically See admin instructions. On dialysis days 12/23/18   [provider]  multivitamin (RENA-VIT) TABS tablet Take 1 tablet by mouth at bedtime. 02/26/19 03/28/19  Roxan Hockey, MD  mupirocin ointment (BACTROBAN) 2 % Apply 1 application topically as needed (on arm).  12/01/18   [provider]  oxymetazoline (AFRIN) 0.05 % nasal spray Place 2 sprays into both nostrils 2 (two) times daily. 01/26/19   Terrilee Croak, MD  pantoprazole (PROTONIX) 40 MG tablet Take 1 tablet (40 mg total) by mouth daily at 6 (six) AM for 30 days. 01/26/19 02/25/19  Dahal, Marlowe Aschoff, MD  senna-docusate (SENOKOT-S) 8.6-50 MG tablet Take 2 tablets by mouth at bedtime. 02/26/19 02/26/20  Roxan Hockey, MD  traZODone (DESYREL) 50 MG tablet Take 0.5  tablets (25 mg total) by mouth at bedtime. 02/26/19 03/28/19  Roxan Hockey, MD    Physical Exam:  Physical Exam Vitals signs reviewed.  Constitutional:      General: She is not in acute distress.    Appearance: She is well-developed. She is not diaphoretic.  Eyes:     Conjunctiva/sclera: Conjunctivae normal.     Pupils: Pupils are equal, round, and reactive to light.  Neck:     Musculoskeletal: Normal range of motion.  Cardiovascular:     Rate and Rhythm: Normal rate and regular rhythm.     Heart sounds: Normal heart sounds. No  murmur.  Pulmonary:     Effort: Pulmonary effort is normal. No respiratory distress.     Breath sounds: Normal breath sounds. No wheezing or rales.  Abdominal:     General: Bowel sounds are normal. There is no distension.     Palpations: Abdomen is soft.     Tenderness: There is no abdominal tenderness. There is no guarding or rebound.  Musculoskeletal: Normal range of motion.        General: No tenderness.     Right lower leg: Edema present.     Left lower leg: Edema present.  Lymphadenopathy:     Cervical: No cervical adenopathy.  Skin:    General: Skin is warm and dry.  Neurological:     Mental Status: She is oriented to person, place, and time. She is lethargic.      Labs on Admission: I have personally reviewed following labs and imaging studies  CBC: Recent Labs  Lab 03/10/19 1254  WBC 4.9  NEUTROABS 3.4  HGB 8.0*  HCT 26.3*  MCV 110.0*  PLT 440    Basic Metabolic Panel: Recent Labs  Lab 03/10/19 1254  NA 133*  K 7.3*  CL 87*  CO2 24  GLUCOSE 168*  BUN 110*  CREATININE 10.87*  CALCIUM 8.8*  MG 2.5*    GFR: Estimated Creatinine Clearance: 5.9 mL/min (A) (by C-G formula based on SCr of 10.87 mg/dL (H)).  Liver Function Tests: Recent Labs  Lab 03/10/19 1254  AST 49*  ALT 50*  ALKPHOS 110  BILITOT 1.9*  PROT 7.4  ALBUMIN 3.7   No results for input(s): LIPASE, AMYLASE in the last 168 hours. No results for  input(s): AMMONIA in the last 168 hours.  Coagulation Profile: Recent Labs  Lab 03/10/19 1254  INR 1.5*    Cardiac Enzymes: No results for input(s): CKTOTAL, CKMB, CKMBINDEX, TROPONINI in the last 168 hours.  BNP (last 3 results) No results for input(s): PROBNP in the last 8760 hours.  HbA1C: No results for input(s): HGBA1C in the last 72 hours.  CBG: No results for input(s): GLUCAP in the last 168 hours.  Lipid Profile: No results for input(s): CHOL, HDL, LDLCALC, TRIG, CHOLHDL, LDLDIRECT in the last 72 hours.  Thyroid Function Tests: No results for input(s): TSH, T4TOTAL, FREET4, T3FREE, THYROIDAB in the last 72 hours.  Anemia Panel: No results for input(s): VITAMINB12, FOLATE, FERRITIN, TIBC, IRON, RETICCTPCT in the last 72 hours.  Urine analysis:    Component Value Date/Time   COLORURINE YELLOW 02/25/2017 1224   APPEARANCEUR HAZY (A) 02/25/2017 1224   LABSPEC 1.013 02/25/2017 1224   PHURINE 8.0 02/25/2017 1224   GLUCOSEU >=500 (A) 02/25/2017 1224   HGBUR SMALL (A) 02/25/2017 1224   BILIRUBINUR NEGATIVE 02/25/2017 1224   BILIRUBINUR neg 12/13/2014 1127   KETONESUR NEGATIVE 02/25/2017 1224   PROTEINUR >=300 (A) 02/25/2017 1224   UROBILINOGEN 0.2 05/01/2015 1252   NITRITE NEGATIVE 02/25/2017 1224   LEUKOCYTESUR NEGATIVE 02/25/2017 1224     Radiological Exams on Admission: No results found.  EKG: Independently reviewed. Peaked t-waves  Assessment/Plan Active Problems:   Uremia  Uremia Secondary to missed HD which is a recurrent issue. Nephrology consulted and recommending emergent HD. -Nephrology recommendations  Hyperkalemia Secondary to above. EKG changes. Given Calcium Gluconate. -HD  Chronic combined systolic and diastolic heart failure Fluid overload secondary to HD non-adherence. Management per HD  Hematochezia Fecal occult positive. This is a chronic issue with multiple workups and no current etiology. -Daily CBC, SCDs -  Will hold off on  GI consult for now  Anemia of chronic disease Chronic blood loss anemia Hemoglobin stable.   ESRD -HD as mentioned above  Diabetes mellitus, type 2 Last hemoglobin A1C of 5.6. On Lantus as an outpatient -SSI q4 hours while NPO. Likely does not need insulin on discharge  Mood disorder Patient is on high doses of Cymbalta which is not recommended in HD patients -Decrease to Cymbalta 60 mg daily   DVT prophylaxis: SCDs Code Status: Full code Family Communication: None Disposition Plan: telemetry Consults called: Nephrology Admission status: Observation   Cordelia Poche, MD [facility] 03/10/2019, 3:42 PM

## 2019-03-10 NOTE — Progress Notes (Signed)
Patient refused to be on cardiac monitoring. Patient educated. On call provider Baltazar Najjar, NP notified. Will continue to monitor.

## 2019-03-10 NOTE — Progress Notes (Signed)
New Admission Note: ? Arrival Method: Came from HD via stretcher Mental Orientation: A/O x 4 Telemetry: Patient refused and was educated. Assessment: Completed Skin: Refer to flowsheet IV: RUA NSL Pain: None Tubes: Safety Measures: Safety Fall Prevention Plan discussed with patient. Admission: Completed [facility] [place] Orientation: Patient has been orientated to the room, unit and the staff. Family: Orders have been reviewed and are being implemented. Will continue to monitor the patient. Call light has been placed within reach and bed alarm has been activated.  ? [employer], [place]

## 2019-03-10 NOTE — Consult Note (Addendum)
[facility] Renal Consultation Note    Indication for Consultation:  Management of ESRD/hemodialysis, anemia, hypertension/volume, and secondary hyperparathyroidism. PCP:  HPI: [name] is a 54 y.o. female with a history of ESRD on dialysis, recurrent GI bleed, bipolar disorder/schizoaffective disorder, TIA and DM who presented to the ED with chief complaint of GI bleed. Patient unable to provide history, obtained from notes and ED provider. [name] reportedly passed maroon colored stool this AM. She said she was supposed to go to her dialysis unit for a make up treatment but was told to come to the ED due to her history of GI bleed and anemia. Patient is a Sales promotion account executive witness and declined blood products, though she has accepted them in the past. She also reported itching which she said had been present for a year. Her last dialysis session was 03/04/2019 and she missed her rescheduled session on 03/08/2019. Of note, patient was recently admitted 02/24/2019-02/26/2019 for hyperkalemia, CP and SOB after missing a dialysis session.   On presentation to the ED, K+ 7.3, BUN 110, Cr 10.87, Hgb 8.0. EKG is pending. T 98.4, BP 143/76, HR 96. At time of my exam, patient reported she was hot then began vomiting repeatedly. She was unable to tell me why she was in the hospital and could not answer ROS questions. I spoke with the ED provider she reports the patient's confusion has worsened since her initial presentation.  Past Medical History:  Diagnosis Date  . Anemia   . Anxiety   . Bipolar disorder, unspecified ([place]) 02/06/2014  . Chronic combined systolic and diastolic CHF (congestive heart failure) (HCC)    EF 40% by echo and 48% by stress test  . Chronic pain syndrome   . Depression   . ESRD on hemodialysis (HCC)    TTS  . GERD (gastroesophageal reflux disease)   . Glaucoma   . Headache    sinus headaches  . Hepatitis C   . High cholesterol   . History of hiatal hernia   . History  of vitamin D deficiency 06/20/2015  . Hypertension   . MRSA infection ?2014   "on the back of my head; spread throughout my bloodstream"  . Neuropathy   . Refusal of blood transfusions as patient is Jehovah's Witness   . Refusal of blood transfusions as patient is Jehovah's Witness   . Schizoaffective disorder, unspecified condition 02/08/2014  . Seizure [facility]) dx'd 2014   "don't know what kind; last one was ~ 04/2014"  . TIA (transient ischemic attack) 2010  . Type II diabetes mellitus ([place])    INSULIN DEPENDENT   Past Surgical History:  Procedure Laterality Date  . ABDOMINAL HYSTERECTOMY  2014  . ABDOMINAL HYSTERECTOMY  2010  . AMPUTATION Left 05/21/2013   Procedure: Left Midfoot AMPUTATION;  Surgeon: Newt Minion, MD;  Location: [facility];  Service: Orthopedics;  Laterality: Left;  Left Midfoot Amputation  . AV FISTULA PLACEMENT Left 05/04/2015   Procedure: ARTERIOVENOUS (AV) GRAFT INSERTION;  Surgeon: Angelia Mould, MD;  Location: [facility];  Service: Vascular;  Laterality: Left;  . BIOPSY  11/18/2018   Procedure: BIOPSY;  Surgeon: Jerene Bears, MD;  Location: [facility] ENDOSCOPY;  Service: Gastroenterology;;  . COLONOSCOPY WITH PROPOFOL N/A 06/22/2013   Procedure: COLONOSCOPY WITH PROPOFOL;  Surgeon: Jeryl Columbia, MD;  Location: [facility] ENDOSCOPY;  Service: Endoscopy;  Laterality: N/A;  . COLONOSCOPY WITH PROPOFOL N/A 11/18/2018   Procedure: COLONOSCOPY WITH PROPOFOL;  Surgeon: Jerene Bears,  MD;  Location: [place];  Service: Gastroenterology;  Laterality: N/A;  . ENTEROSCOPY N/A 12/31/2018   Procedure: ENTEROSCOPY;  Surgeon: Irene Shipper, MD;  Location: [facility] ENDOSCOPY;  Service: Endoscopy;  Laterality: N/A;  Push enteroscopy at 8am (spoke with Endo)  . ESOPHAGOGASTRODUODENOSCOPY N/A 05/11/2015   Procedure: ESOPHAGOGASTRODUODENOSCOPY (EGD);  Surgeon: Laurence Spates, MD;  Location: [facility] ENDOSCOPY;  Service: Endoscopy;  Laterality: N/A;  . ESOPHAGOGASTRODUODENOSCOPY (EGD) WITH PROPOFOL N/A 11/18/2018    Procedure: ESOPHAGOGASTRODUODENOSCOPY (EGD) WITH PROPOFOL;  Surgeon: Jerene Bears, MD;  Location: [facility] ENDOSCOPY;  Service: Gastroenterology;  Laterality: N/A;  . EYE SURGERY Bilateral 2010   Lasik  . FLEXIBLE SIGMOIDOSCOPY N/A 01/17/2017   Procedure: FLEXIBLE SIGMOIDOSCOPY w/ hemorrhoid banding possible;  Surgeon: Gatha Mayer, MD;  Location: [facility] END[facility]COPY;  Service: Endoscopy;  Laterality: N/A;  . FLEXIBLE SIGMOIDOSCOPY N/A 01/21/2019   Procedure: FLEXIBLE SIGMOIDOSCOPY;  Surgeon: Ronald Lobo, MD;  Location: [facility] ENDOSCOPY;  Service: Endoscopy;  Laterality: N/A;  Plan to do the test unprepped, unsedated  . FOOT AMPUTATION Left    due diabetic neuropathy, could not feel ulcer on bottom of foot  . GIVENS CAPSULE STUDY N/A 01/21/2019   Procedure: GIVENS CAPSULE STUDY;  Surgeon: Ronald Lobo, MD;  Location: [facility] ENDOSCOPY;  Service: Endoscopy;  Laterality: N/A;  . POLYPECTOMY  11/18/2018   Procedure: POLYPECTOMY;  Surgeon: Jerene Bears, MD;  Location: [facility] ENDOSCOPY;  Service: Gastroenterology;;  . REFRACTIVE SURGERY Bilateral 2013  . TONSILLECTOMY  1970's  . TUBAL LIGATION  2000   Family History  Problem Relation Age of Onset  . Hypertension Mother   . Diabetes Mother   . Hypertension Father   . Diabetes Father    Social History:  reports that she has been smoking cigarettes. She has a 36.00 pack-year smoking history. She has never used smokeless tobacco. She reports current alcohol use of about 3.0 standard drinks of alcohol per week. She reports previous drug use. Drugs: "Crack" cocaine and Cocaine.  ROS: Unable to complete ROS due to encephalopathy  Physical Exam: Vitals:   03/10/19 1226  BP: (!) 143/76  Pulse: 82  Resp: 15  Temp: 98.4 F (36.9 C)  TempSrc: Oral  SpO2: 96%     General: Chronically ill appearing female, uncomfortable, vomiting and trying to remove her gown Head: Normocephalic, atraumatic, sclera non-icteric, mucus membranes are moist. Neck: JVD not  elevated. Lungs: Clear bilaterally to auscultation without wheezes, rales, or rhonchi. Breathing is unlabored. Heart: RRR with normal S1, S2. No murmurs, rubs, or gallops appreciated. Abdomen: + BS, non-tender. Exam limited due to patient's restlessness Musculoskeletal:  Strength and tone appear normal for age. Lower extremities: No edema or ischemic changes, no open wounds. Multiple bleeding excoriations noted on legs Neuro: Alert, oriented to person but not place, time.  Dialysis Access: LUE AVG + thrill  Allergies  Allergen Reactions  . Penicillins Hives and Other (See Comments)    Has patient had a PCN reaction causing immediate rash, facial/tongue/throat swelling, SOB or lightheadedness with hypotension: Yes Has patient had a PCN reaction causing severe rash involving mucus membranes or skin necrosis: No Has patient had a PCN reaction that required hospitalization No Has patient had a PCN reaction occurring within the last 10 years: No If all of the above answers are "NO", then may proceed with Cephalosporin use. Pt tolerates cefepime and ceftriaxone  . Gabapentin Itching and Swelling  . Lyrica [Pregabalin] Swelling and Other (See Comments)    Facial and leg swelling  .  Saphris [Asenapine] Swelling and Other (See Comments)    Facial swelling  . Tramadol Swelling and Other (See Comments)    Leg swelling  . Adhesive [Tape] Itching    SEVERE ITCHING  . Latex Itching    SEVERE ITCHING  . Other Other (See Comments)    NO BLOOD PRODUCTS; PATIENT IS A JEHOVAH'S WITNESS   Prior to Admission medications   Medication Sig Start Date End Date Taking? Authorizing Provider  acetaminophen (TYLENOL) 325 MG tablet Take 2 tablets (650 mg total) by mouth every 6 (six) hours as needed for fever, headache or moderate pain. 02/26/19   Roxan Hockey, MD  albuterol (VENTOLIN HFA) 108 (90 Base) MCG/ACT inhaler Inhale 2 puffs into the lungs every 4 (four) hours as needed for wheezing or shortness of  breath. 01/26/19   Dahal, Marlowe Aschoff, MD  allopurinol (ZYLOPRIM) 100 MG tablet Take 2 tablets (200 mg total) by mouth daily. 02/26/19 03/28/19  Roxan Hockey, MD  atorvastatin (LIPITOR) 40 MG tablet Take 1 tablet (40 mg total) by mouth at bedtime. 02/26/19   Roxan Hockey, MD  calcitRIOL (ROCALTROL) 0.25 MCG capsule Take 4 capsules (1 mcg total) by mouth every dialysis (3 times per week on TTS). 02/26/19   Roxan Hockey, MD  Darbepoetin Alfa (ARANESP) 200 MCG/0.4ML SOSY injection Inject 0.4 mLs (200 mcg total) into the vein every Wednesday with hemodialysis. 01/27/19   Terrilee Croak, MD  diphenhydrAMINE (BENADRYL) 25 mg capsule Take 1 capsule (25 mg total) by mouth every 6 (six) hours as needed for up to 8 days for itching. Patient not taking: Reported on 02/25/2019 01/26/19 02/25/28  Terrilee Croak, MD  DULoxetine (CYMBALTA) 30 MG capsule Take 3 capsules (90 mg total) by mouth daily. 02/26/19 03/28/19  Roxan Hockey, MD  ferric citrate (AURYXIA) 1 GM 210 MG(Fe) tablet Take 2 tablets (420 mg total) by mouth 3 (three) times daily with meals. 02/26/19 03/28/19  Roxan Hockey, MD  folic acid (FOLVITE) 1 MG tablet Take 1 tablet (1 mg total) by mouth daily. 02/26/19 03/28/19  Roxan Hockey, MD  hydrocortisone (ANUSOL-HC) 2.5 % rectal cream Place rectally 2 (two) times daily. Patient taking differently: Place 1 application rectally 2 (two) times daily.  01/26/19   Terrilee Croak, MD  hydrOXYzine (ATARAX/VISTARIL) 25 MG tablet Take 1 tablet (25 mg total) by mouth 3 (three) times daily as needed for itching or anxiety. 02/26/19   Roxan Hockey, MD  insulin aspart (NOVOLOG) 100 UNIT/ML injection 0-9 Units, Subcutaneous, 3 times daily with meals CBG < 70: implement hypoglycemia protocol CBG 70 - 120: 0 units CBG 121 - 150: 1 unit CBG 151 - 200: 2 units CBG 201 - 250: 3 units CBG 251 - 300: 5 units CBG 301 - 350: 7 units CBG 351 - 400: 9 units CBG > 400: call MD 01/26/19   Terrilee Croak, MD  insulin glargine  (LANTUS) 100 UNIT/ML injection Inject 0.2 mLs (20 Units total) into the skin at bedtime for 30 days. 01/26/19 02/25/19  Terrilee Croak, MD  labetalol (NORMODYNE) 200 MG tablet Take 1 tablet (200 mg total) by mouth 2 (two) times daily. 02/26/19 03/28/19  Roxan Hockey, MD  lidocaine-prilocaine (EMLA) cream Apply 1 application topically See admin instructions. On dialysis days 12/23/18   [provider]  multivitamin (RENA-VIT) TABS tablet Take 1 tablet by mouth at bedtime. 02/26/19 03/28/19  Roxan Hockey, MD  mupirocin ointment (BACTROBAN) 2 % Apply 1 application topically as needed (on arm).  12/01/18   [provider]  oxymetazoline (AFRIN) 0.05 % nasal spray Place 2 sprays into both nostrils 2 (two) times daily. 01/26/19   Terrilee Croak, MD  pantoprazole (PROTONIX) 40 MG tablet Take 1 tablet (40 mg total) by mouth daily at 6 (six) AM for 30 days. 01/26/19 02/25/19  Dahal, Marlowe Aschoff, MD  senna-docusate (SENOKOT-S) 8.6-50 MG tablet Take 2 tablets by mouth at bedtime. 02/26/19 02/26/20  Roxan Hockey, MD  traZODone (DESYREL) 50 MG tablet Take 0.5 tablets (25 mg total) by mouth at bedtime. 02/26/19 03/28/19  Roxan Hockey, MD   Current Facility-Administered Medications  Medication Dose Route Frequency Provider Last Rate Last Dose  . calcium gluconate inj 10% (1 g) URGENT USE ONLY!  1 g Intravenous Once Collins, Samantha G, PA-C      . [START ON 03/11/2019] Chlorhexidine Gluconate Cloth 2 % PADS 6 each  6 each Topical Q0600 Collins, Samantha G, PA-C      . insulin aspart (novoLOG) injection 5 Units  5 Units Intravenous Once Winona, Samantha G, PA-C       And  . dextrose 50 % solution 50 mL  1 ampule Intravenous Once Lynn, Samantha G, PA-C      . ondansetron (ZOFRAN) injection 4 mg  4 mg Intravenous Once Kinnie Feil, PA-C       Current Outpatient Medications  Medication Sig Dispense Refill  . acetaminophen (TYLENOL) 325 MG tablet Take 2 tablets (650 mg total) by mouth every 6  (six) hours as needed for fever, headache or moderate pain. 12 tablet 0  . albuterol (VENTOLIN HFA) 108 (90 Base) MCG/ACT inhaler Inhale 2 puffs into the lungs every 4 (four) hours as needed for wheezing or shortness of breath. 6.7 g 0  . allopurinol (ZYLOPRIM) 100 MG tablet Take 2 tablets (200 mg total) by mouth daily. 60 tablet 3  . atorvastatin (LIPITOR) 40 MG tablet Take 1 tablet (40 mg total) by mouth at bedtime. 30 tablet 3  . calcitRIOL (ROCALTROL) 0.25 MCG capsule Take 4 capsules (1 mcg total) by mouth every dialysis (3 times per week on TTS).    . Darbepoetin Alfa (ARANESP) 200 MCG/0.4ML SOSY injection Inject 0.4 mLs (200 mcg total) into the vein every Wednesday with hemodialysis. 1.68 mL   . diphenhydrAMINE (BENADRYL) 25 mg capsule Take 1 capsule (25 mg total) by mouth every 6 (six) hours as needed for up to 8 days for itching. (Patient not taking: Reported on 02/25/2019) 30 capsule 0  . DULoxetine (CYMBALTA) 30 MG capsule Take 3 capsules (90 mg total) by mouth daily. 90 capsule 5  . ferric citrate (AURYXIA) 1 GM 210 MG(Fe) tablet Take 2 tablets (420 mg total) by mouth 3 (three) times daily with meals. 321 tablet 0  . folic acid (FOLVITE) 1 MG tablet Take 1 tablet (1 mg total) by mouth daily. 30 tablet 11  . hydrocortisone (ANUSOL-HC) 2.5 % rectal cream Place rectally 2 (two) times daily. (Patient taking differently: Place 1 application rectally 2 (two) times daily. ) 30 g 0  . hydrOXYzine (ATARAX/VISTARIL) 25 MG tablet Take 1 tablet (25 mg total) by mouth 3 (three) times daily as needed for itching or anxiety. 30 tablet 0  . insulin aspart (NOVOLOG) 100 UNIT/ML injection 0-9 Units, Subcutaneous, 3 times daily with meals CBG < 70: implement hypoglycemia protocol CBG 70 - 120: 0 units CBG 121 - 150: 1 unit CBG 151 - 200: 2 units CBG 201 - 250: 3 units CBG 251 - 300: 5 units CBG 301 - 350: 7 units  CBG 351 - 400: 9 units CBG > 400: call MD 10 mL 11  . insulin glargine (LANTUS) 100 UNIT/ML  injection Inject 0.2 mLs (20 Units total) into the skin at bedtime for 30 days. 6 mL 0  . labetalol (NORMODYNE) 200 MG tablet Take 1 tablet (200 mg total) by mouth 2 (two) times daily. 60 tablet 5  . lidocaine-prilocaine (EMLA) cream Apply 1 application topically See admin instructions. On dialysis days    . multivitamin (RENA-VIT) TABS tablet Take 1 tablet by mouth at bedtime. 30 tablet 11  . mupirocin ointment (BACTROBAN) 2 % Apply 1 application topically as needed (on arm).     Marland Kitchen oxymetazoline (AFRIN) 0.05 % nasal spray Place 2 sprays into both nostrils 2 (two) times daily. 20 mL 0  . pantoprazole (PROTONIX) 40 MG tablet Take 1 tablet (40 mg total) by mouth daily at 6 (six) AM for 30 days. 30 tablet 0  . senna-docusate (SENOKOT-S) 8.6-50 MG tablet Take 2 tablets by mouth at bedtime. 60 tablet 1  . traZODone (DESYREL) 50 MG tablet Take 0.5 tablets (25 mg total) by mouth at bedtime. 15 tablet 0   Labs: Basic Metabolic Panel: Recent Labs  Lab 03/10/19 1254  NA 133*  K 7.3*  CL 87*  CO2 24  GLUCOSE 168*  BUN 110*  CREATININE 10.87*  CALCIUM 8.8*   Liver Function Tests: Recent Labs  Lab 03/10/19 1254  AST 49*  ALT 50*  ALKPHOS 110  BILITOT 1.9*  PROT 7.4  ALBUMIN 3.7   CBC: Recent Labs  Lab 03/10/19 1254  WBC 4.9  NEUTROABS 3.4  HGB 8.0*  HCT 26.3*  MCV 110.0*  PLT 155    Dialysis Orders:  Center: [place] on TTS. Time: 3.5h; 160 NRe; BFR 300/DFR 600; EDW 72kg; 2K/2Ca; UF Profile 4; LUE AVG Heparin: None Venofer 100mg  IV q HD x 10 treatments (only received 3 doses so far due to missed dialysis sessions) Mircera 263mcg IV q 2 weeks- last dose 7/29 Calcitriol 2.25 mcg PO TIW with dialysis  Assessment/Plan: 1.  Hyperkalemia: Due to dialysis non-compliance. EKG is pending. Ordered calcium gluconate and insulin. Plan for urgent dialysis on low K+ bath (next patient). Will need to discuss compliance/K+ restrictions once patient has stabilized.   2. Encephalopathy: Not oriented to place or situation at time of my exam. May be due to uremia from missed dialysis. Per ED, planning to admit. Follow mental status post-HD 3. Nausea/Vomiting: Likely due to uremia. Zofran ordered and urgent HD as above. 4.  ESRD:  TTS, last treatment 6 days ago. Hyperkalemic and likely uremic. Was also admitted 7/22-7/24 for hyperkalemia from missed/shortened treatments. Urgent HD today as above. Non-compliance has become more of an issue lately.  5.  Hypertension/volume: Blood pressure moderately elevated, no edema on exam. UFG 2L as tolerated today.  6.  Anemia/BRBPR: Extensive GI bleed, does not accept blood products. Hemoglobin is actually stable today at 8.0. Not due for ESA yet. Transfuse PRN. 7.  Metabolic bone disease: Calcium 8.8. Continue calcitriol and auryxia.  8.  Nutrition:  Albumin 3.7. Renal diet with fluid restrictions  Anice Paganini, PA-C 03/10/2019, 2:44 PM  [facility] Pager: [phone]  Pt seen, examined and agree w A/P as above. Patient w/ rectal bleeding and also very high K+.  QRS is narrow fortunately. Plan HD asap upstairs, have orders IV Ca/ ins /glucose for acute Rx.  Kelly Splinter  MD 03/10/2019, 3:47 PM

## 2019-03-10 NOTE — ED Notes (Signed)
IV team at bedside 

## 2019-03-10 NOTE — ED Triage Notes (Signed)
Per [facility]: Pt was due to get dialysis today but did not get it. Pt complaining of lower GI bleeding that is bright red. Pt denies odor to GI bleeding. Pt also has chronic leg pain. Pt did step on glass last night. [employer] also called to home last night due to hallucinations. Pt itching and telling [facility] that she is allergic to everything she is wearing. Pt is dialysis fistula in left FA.

## 2019-03-10 NOTE — ED Provider Notes (Addendum)
[facility] EMERGENCY DEPARTMENT Provider Note   CSN: [id number] Arrival date & time: 03/10/19  1149    History   Chief Complaint Chief Complaint  Patient presents with  . GI Bleeding    Lower    HPI [name] is a 53 y.o. female with history of ESRD on HD TTS, HTN, HLD, diabetes, TIA, GERD, bipolar disorder, anemia, recurrent GI bleed, tobacco use, CHF with EF 45% presents to the ER for evaluation of rectal bleeding.  She noticed this morning, she thought she was going to have a bowel movement but instead maroon blood dripped out of her bottom with some clots.  Approximately the size of her palm.  This only happened once.  She was supposed to go to dialysis so she called the dialysis coordinator and let her know that she is having GI bleed, she was told to come to the ER because she has history of recurrent GI bleeding and very low hemoglobin.  States in the past they have given her blood transfusion but she is adamantly declining this now.  States that once they gave her old persons blood and she could not walk fast anymore after that.  She is also afraid that she is going to have an infection from the blood transfusion.  She denies any fever, chills, chest pain, shortness of breath, hematemesis, abdominal pain, diarrhea or constipation.  She did not have a bowel movement today but only blood came out.  This was painless.  She last had dialysis on Saturday, missed yesterday and was rescheduled for today. HD at [facility] on [address] Reports diffuse itching that has been there for 1 year. Was told by doctors it was either MRSA or calciphylaxis. She wants to be tested for MRSA.     HPI  Past Medical History:  Diagnosis Date  . Anemia   . Anxiety   . Bipolar disorder, unspecified ([place]) 02/06/2014  . Chronic combined systolic and diastolic CHF (congestive heart failure) (HCC)    EF 40% by echo and 48% by stress test  . Chronic pain syndrome   . Depression   . ESRD on  hemodialysis (HCC)    TTS  . GERD (gastroesophageal reflux disease)   . Glaucoma   . Headache    sinus headaches  . Hepatitis C   . High cholesterol   . History of hiatal hernia   . History of vitamin D deficiency 06/20/2015  . Hypertension   . MRSA infection ?2014   "on the back of my head; spread throughout my bloodstream"  . Neuropathy   . Refusal of blood transfusions as patient is Jehovah's Witness   . Refusal of blood transfusions as patient is Jehovah's Witness   . Schizoaffective disorder, unspecified condition 02/08/2014  . Seizure [facility]) dx'd 2014   "don't know what kind; last one was ~ 04/2014"  . TIA (transient ischemic attack) 2010  . Type II diabetes mellitus ([place])    INSULIN DEPENDENT    Patient Active Problem List   Diagnosis Date Noted  . Type II diabetes mellitus with renal manifestations ([place]) 02/25/2019  . TIA (transient ischemic attack) 02/25/2019  . GERD (gastroesophageal reflux disease) 02/25/2019  . Tobacco abuse 02/25/2019  . Fluid overload 02/25/2019  . Uremia 02/25/2019  . Melena   . Rectal bleed 12/30/2018  . Gastritis and gastroduodenitis   . Benign neoplasm of ascending colon   . Benign neoplasm of transverse colon   . Benign neoplasm of  descending colon   . GI bleeding 11/17/2018  . Acute blood loss anemia   . No blood products   . GI bleed 10/05/2018  . Prolonged QT interval 10/05/2018  . Localized edema due to fluid overload 09/05/2017  . Lower GI bleed   . Evaluation by psychiatric service required 07/17/2017  . Volume overload 06/03/2017  . Right supracondylar humerus fracture, with nonunion, subsequent encounter 05/01/2017  . Malnutrition of moderate degree 02/28/2017  . Leg pain, left 02/25/2017  . Left leg pain 02/25/2017  . External hemorrhoids   . Diverticulosis of colon without hemorrhage   . Goals of care, counseling/discussion   . Palliative care encounter   . Cellulitis of left leg 01/16/2017  . Upper GI bleed 01/16/2017   . UGIB (upper gastrointestinal bleed)   . Anemia due to chronic kidney disease   . Cellulitis of left thigh 01/01/2017  . ESRD on dialysis ([place]) 01/01/2017  . Cocaine use disorder, severe, dependence ([place]) 08/29/2016  . Hepatitis C 08/29/2016  . Diabetic ulcer of calf ([place]) 08/29/2016  . Diabetic ulcer of toe ([place]) 08/29/2016  . Cerebrovascular accident (CVA) ([place]) 08/29/2016  . MDD (major depressive disorder) 08/28/2016  . Dyslipidemia associated with type 2 diabetes mellitus ([place]) 11/10/2015  . Hyperkalemia 09/17/2015  . History of vitamin D deficiency 06/20/2015  . Long term prescription opiate use 06/20/2015  . Chronic pain syndrome 06/19/2015  . Anemia of chronic kidney failure 05/01/2015  . transmetatarsal amputation of the left foot 12/13/2014  . Unilateral visual loss 11/22/2014  . 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 ([place]) 11/22/2014  . Elevated troponin 11/09/2014  . Essential hypertension 11/09/2014  . Dizziness   . Chronic combined systolic (congestive) and diastolic (congestive) heart failure ([place])   . Chest pain 08/25/2014  . Rectal bleeding 04/23/2014  . Anemia, iron deficiency 04/13/2014  . Noncompliance 02/02/2014  . Hyponatremia 05/19/2013  . Seizure disorder ([place]) 02/09/2013    Past Surgical History:  Procedure Laterality Date  . ABDOMINAL HYSTERECTOMY  2014  . ABDOMINAL HYSTERECTOMY  2010  . AMPUTATION Left 05/21/2013   Procedure: Left Midfoot AMPUTATION;  Surgeon: Newt Minion, MD;  Location: [facility];  Service: Orthopedics;  Laterality: Left;  Left Midfoot Amputation  . AV FISTULA PLACEMENT Left 05/04/2015   Procedure: ARTERIOVENOUS (AV) GRAFT INSERTION;  Surgeon: Angelia Mould, MD;  Location: [facility];  Service: Vascular;  Laterality: Left;  . BIOPSY  11/18/2018   Procedure: BIOPSY;  Surgeon: Jerene Bears, MD;  Location: [facility] ENDOSCOPY;  Service: Gastroenterology;;  . COLONOSCOPY WITH PROPOFOL N/A 06/22/2013   Procedure: COLONOSCOPY WITH  PROPOFOL;  Surgeon: Jeryl Columbia, MD;  Location: [facility] ENDOSCOPY;  Service: Endoscopy;  Laterality: N/A;  . COLONOSCOPY WITH PROPOFOL N/A 11/18/2018   Procedure: COLONOSCOPY WITH PROPOFOL;  Surgeon: Jerene Bears, MD;  Location: [facility];  Service: Gastroenterology;  Laterality: N/A;  . ENTEROSCOPY N/A 12/31/2018   Procedure: ENTEROSCOPY;  Surgeon: Irene Shipper, MD;  Location: [facility] ENDOSCOPY;  Service: Endoscopy;  Laterality: N/A;  Push enteroscopy at 8am (spoke with Endo)  . ESOPHAGOGASTRODUODENOSCOPY N/A 05/11/2015   Procedure: ESOPHAGOGASTRODUODENOSCOPY (EGD);  Surgeon: Laurence Spates, MD;  Location: [facility] ENDOSCOPY;  Service: Endoscopy;  Laterality: N/A;  . ESOPHAGOGASTRODUODENOSCOPY (EGD) WITH PROPOFOL N/A 11/18/2018   Procedure: ESOPHAGOGASTRODUODENOSCOPY (EGD) WITH PROPOFOL;  Surgeon: Jerene Bears, MD;  Location: [facility] ENDOSCOPY;  Service: Gastroenterology;  Laterality: N/A;  . EYE SURGERY Bilateral 2010   Lasik  . FLEXIBLE SIGMOIDOSCOPY N/A 01/17/2017  Procedure: FLEXIBLE SIGMOIDOSCOPY w/ hemorrhoid banding possible;  Surgeon: Gatha Mayer, MD;  Location: [facility] ENDOSCOPY;  Service: Endoscopy;  Laterality: N/A;  . FLEXIBLE SIGMOIDOSCOPY N/A 01/21/2019   Procedure: FLEXIBLE SIGMOIDOSCOPY;  Surgeon: Ronald Lobo, MD;  Location: [facility] ENDOSCOPY;  Service: Endoscopy;  Laterality: N/A;  Plan to do the test unprepped, unsedated  . FOOT AMPUTATION Left    due diabetic neuropathy, could not feel ulcer on bottom of foot  . GIVENS CAPSULE STUDY N/A 01/21/2019   Procedure: GIVENS CAPSULE STUDY;  Surgeon: Ronald Lobo, MD;  Location: [facility] ENDOSCOPY;  Service: Endoscopy;  Laterality: N/A;  . POLYPECTOMY  11/18/2018   Procedure: POLYPECTOMY;  Surgeon: Jerene Bears, MD;  Location: [facility] ENDOSCOPY;  Service: Gastroenterology;;  . REFRACTIVE SURGERY Bilateral 2013  . TONSILLECTOMY  1970's  . TUBAL LIGATION  2000     OB History    Gravida  0   Para  0   Term  0   Preterm  0   AB  0   Living        SAB   0   TAB  0   Ectopic  0   Multiple      Live Births               Home Medications    Prior to Admission medications   Medication Sig Start Date End Date Taking? Authorizing Provider  acetaminophen (TYLENOL) 325 MG tablet Take 2 tablets (650 mg total) by mouth every 6 (six) hours as needed for fever, headache or moderate pain. 02/26/19   Roxan Hockey, MD  albuterol (VENTOLIN HFA) 108 (90 Base) MCG/ACT inhaler Inhale 2 puffs into the lungs every 4 (four) hours as needed for wheezing or shortness of breath. 01/26/19   Dahal, Marlowe Aschoff, MD  allopurinol (ZYLOPRIM) 100 MG tablet Take 2 tablets (200 mg total) by mouth daily. 02/26/19 03/28/19  Roxan Hockey, MD  atorvastatin (LIPITOR) 40 MG tablet Take 1 tablet (40 mg total) by mouth at bedtime. 02/26/19   Roxan Hockey, MD  calcitRIOL (ROCALTROL) 0.25 MCG capsule Take 4 capsules (1 mcg total) by mouth every dialysis (3 times per week on TTS). 02/26/19   Roxan Hockey, MD  Darbepoetin Alfa (ARANESP) 200 MCG/0.4ML SOSY injection Inject 0.4 mLs (200 mcg total) into the vein every Wednesday with hemodialysis. 01/27/19   Terrilee Croak, MD  diphenhydrAMINE (BENADRYL) 25 mg capsule Take 1 capsule (25 mg total) by mouth every 6 (six) hours as needed for up to 8 days for itching. Patient not taking: Reported on 02/25/2019 01/26/19 02/25/28  Terrilee Croak, MD  DULoxetine (CYMBALTA) 30 MG capsule Take 3 capsules (90 mg total) by mouth daily. 02/26/19 03/28/19  Roxan Hockey, MD  ferric citrate (AURYXIA) 1 GM 210 MG(Fe) tablet Take 2 tablets (420 mg total) by mouth 3 (three) times daily with meals. 02/26/19 03/28/19  Roxan Hockey, MD  folic acid (FOLVITE) 1 MG tablet Take 1 tablet (1 mg total) by mouth daily. 02/26/19 03/28/19  Roxan Hockey, MD  hydrocortisone (ANUSOL-HC) 2.5 % rectal cream Place rectally 2 (two) times daily. Patient taking differently: Place 1 application rectally 2 (two) times daily.  01/26/19   Terrilee Croak, MD  hydrOXYzine  (ATARAX/VISTARIL) 25 MG tablet Take 1 tablet (25 mg total) by mouth 3 (three) times daily as needed for itching or anxiety. 02/26/19   Roxan Hockey, MD  insulin aspart (NOVOLOG) 100 UNIT/ML injection 0-9 Units, Subcutaneous, 3 times daily with meals CBG < 70: implement hypoglycemia protocol  CBG 70 - 120: 0 units CBG 121 - 150: 1 unit CBG 151 - 200: 2 units CBG 201 - 250: 3 units CBG 251 - 300: 5 units CBG 301 - 350: 7 units CBG 351 - 400: 9 units CBG > 400: call MD 01/26/19   Terrilee Croak, MD  insulin glargine (LANTUS) 100 UNIT/ML injection Inject 0.2 mLs (20 Units total) into the skin at bedtime for 30 days. 01/26/19 02/25/19  Terrilee Croak, MD  labetalol (NORMODYNE) 200 MG tablet Take 1 tablet (200 mg total) by mouth 2 (two) times daily. 02/26/19 03/28/19  Roxan Hockey, MD  lidocaine-prilocaine (EMLA) cream Apply 1 application topically See admin instructions. On dialysis days 12/23/18   [provider]  multivitamin (RENA-VIT) TABS tablet Take 1 tablet by mouth at bedtime. 02/26/19 03/28/19  Roxan Hockey, MD  mupirocin ointment (BACTROBAN) 2 % Apply 1 application topically as needed (on arm).  12/01/18   [provider]  oxymetazoline (AFRIN) 0.05 % nasal spray Place 2 sprays into both nostrils 2 (two) times daily. 01/26/19   Terrilee Croak, MD  pantoprazole (PROTONIX) 40 MG tablet Take 1 tablet (40 mg total) by mouth daily at 6 (six) AM for 30 days. 01/26/19 02/25/19  Dahal, Marlowe Aschoff, MD  senna-docusate (SENOKOT-S) 8.6-50 MG tablet Take 2 tablets by mouth at bedtime. 02/26/19 02/26/20  Roxan Hockey, MD  traZODone (DESYREL) 50 MG tablet Take 0.5 tablets (25 mg total) by mouth at bedtime. 02/26/19 03/28/19  Roxan Hockey, MD    Family History Family History  Problem Relation Age of Onset  . Hypertension Mother   . Diabetes Mother   . Hypertension Father   . Diabetes Father     Social History Social History   Tobacco Use  . Smoking status: Current Some Day Smoker     Packs/day: 1.00    Years: 36.00    Pack years: 36.00    Types: Cigarettes  . Smokeless tobacco: Never Used  . Tobacco comment: 5 cigs in one setting  Substance Use Topics  . Alcohol use: Yes    Alcohol/week: 3.0 standard drinks    Types: 3 Standard drinks or equivalent per week    Frequency: Never    Comment: last use about 2 weeks ago  . Drug use: Not Currently    Types: "Crack" cocaine, Cocaine    Comment: last use >2 years ago     Allergies   Penicillins, Gabapentin, Lyrica [pregabalin], Saphris [asenapine], Tramadol, Adhesive [tape], Latex, and Other   Review of Systems Review of Systems  Gastrointestinal: Positive for blood in stool.  All other systems reviewed and are negative.    Physical Exam Updated Vital Signs BP (!) 170/80   Pulse 76   Temp 98.6 F (37 C) (Oral)   Resp 16   Wt 72.5 kg   LMP  (LMP Unknown)   SpO2 96%   BMI 27.44 kg/m   Physical Exam Vitals signs and nursing note reviewed.  Constitutional:      Appearance: She is well-developed.     Comments: Non toxic in NAD. Cooperative. Itching during exam.  HENT:     Head: Normocephalic and atraumatic.     Nose: Nose normal.  Eyes:     Conjunctiva/sclera: Conjunctivae normal.  Neck:     Musculoskeletal: Normal range of motion.  Cardiovascular:     Rate and Rhythm: Normal rate and regular rhythm.  Pulmonary:     Effort: Pulmonary effort is normal.     Breath sounds: Normal  breath sounds.  Abdominal:     General: Bowel sounds are normal.     Palpations: Abdomen is soft.     Tenderness: There is no abdominal tenderness.     Comments: No G/R/R. No suprapubic or CVA tenderness. Negative Murphy's and McBurney's. Active BS to lower quadrants.   Genitourinary:    Rectum: Guaiac result positive.     Comments:  3 non tender non thrombosed external hemorrhoids.  Maroon/purple tinged stool on glove. No tenderness on exam. Good rectal tone.  Musculoskeletal: Normal range of motion.  Skin:     General: Skin is warm and dry.     Capillary Refill: Capillary refill takes less than 2 seconds.  Neurological:     Mental Status: She is alert.     Comments: Spontaneously moves all four extremities in symmetric and coordinated fashion. Speech is normal.   Psychiatric:        Behavior: Behavior normal.     Comments: Odd affect, some fidgeting, poor eye contact.      ED Treatments / Results  Labs (all labs ordered are listed, but only abnormal results are displayed) Labs Reviewed  COMPREHENSIVE METABOLIC PANEL - Abnormal; Notable for the following components:      Result Value   Sodium 133 (*)    Potassium 7.3 (*)    Chloride 87 (*)    Glucose, Bld 168 (*)    BUN 110 (*)    Creatinine, Ser 10.87 (*)    Calcium 8.8 (*)    AST 49 (*)    ALT 50 (*)    Total Bilirubin 1.9 (*)    GFR calc non Af Amer 4 (*)    GFR calc Af Amer 4 (*)    Anion gap 22 (*)    All other components within normal limits  CBC WITH DIFFERENTIAL/PLATELET - Abnormal; Notable for the following components:   RBC 2.39 (*)    Hemoglobin 8.0 (*)    HCT 26.3 (*)    MCV 110.0 (*)    RDW 16.9 (*)    nRBC 0.4 (*)    All other components within normal limits  PROTIME-INR - Abnormal; Notable for the following components:   Prothrombin Time 17.8 (*)    INR 1.5 (*)    All other components within normal limits  MAGNESIUM - Abnormal; Notable for the following components:   Magnesium 2.5 (*)    All other components within normal limits  I-STAT BETA HCG BLOOD, ED ([facility], [facility], [facility] ONLY) - Abnormal; Notable for the following components:   I-stat hCG, quantitative 6.6 (*)    All other components within normal limits  POC OCCULT BLOOD, ED - Abnormal; Notable for the following components:   Fecal Occult Bld POSITIVE (*)    All other components within normal limits  SARS CORONAVIRUS 2  HEPATITIS B SURFACE ANTIGEN    EKG EKG Interpretation  Date/Time:  Wednesday March 10 2019 14:45:43 EDT Ventricular Rate:  79 PR  Interval:    QRS Duration: 135 QT Interval:  464 QTC Calculation: 532 R Axis:   101 Text Interpretation:  Sinus rhythm Prolonged PR interval IVCD, consider atypical RBBB Borderline ST depression, lateral leads When compared to prior, more peaked t waves concernin for hyperkalemia.  No STEMI Confirmed by Antony Blackbird [phone]) on 03/10/2019 3:09:32 PM   Radiology No results found.  Procedures .Critical Care Performed by: Kinnie Feil, PA-C Authorized by: Kinnie Feil, PA-C   Critical care provider statement:  Critical care was necessary to treat or prevent imminent or life-threatening deterioration of the following conditions:  Metabolic crisis and renal failure (uremia, encephalopathy requiring urgent HD, GIB)   Critical care was time spent personally by me on the following activities:  Development of treatment plan with patient or surrogate, discussions with consultants, examination of patient, obtaining history from patient or surrogate, ordering and performing treatments and interventions, ordering and review of laboratory studies, ordering and review of radiographic studies, re-evaluation of patient's condition and review of old charts   I assumed direction of critical care for this patient from another provider in my specialty: no     (including critical care time)  Medications Ordered in ED Medications  Chlorhexidine Gluconate Cloth 2 % PADS 6 each (has no administration in time range)  ondansetron (ZOFRAN) injection 4 mg (4 mg Intravenous Incomplete 03/10/19 1506)  calcium gluconate inj 10% (1 g) URGENT USE ONLY! (has no administration in time range)  insulin aspart (novoLOG) injection 5 Units (has no administration in time range)    And  dextrose 50 % solution 50 mL (has no administration in time range)  calcitRIOL (ROCALTROL) capsule 2.25 mcg (has no administration in time range)  ferric citrate (AURYXIA) tablet 420 mg (has no administration in time range)      Initial Impression / Assessment and Plan / ED Course  I have reviewed the triage vital signs and the nursing notes.  Pertinent labs & imaging results that were available during my care of the patient were reviewed by me and considered in my medical decision making (see chart for details).  Clinical Course as of Mar 09 1813  Wed Mar 10, 2019  1331 Hemoglobin(!): 8.0 [CG]  1331 INR(!): 1.5 [CG]  1331 Fecal Occult Blood, POC(!): POSITIVE [CG]  1414 Potassium(!!): 7.3 [CG]  1415 Magnesium(!): 2.5 [CG]  1415 Creatinine(!): 10.87 [CG]  1415 BUN(!): 110 [CG]  1415 Anion gap(!): 22 [CG]  1437 I was walking by patient's room and saw her vomiting off the side of the bed.  I reevaluated her.  She is writhing in the bed but still redirectable.  States that she just started vomiting.  She is slightly confused but still follows commands.  Nephrology PA at bedside familiar with patient and recommends emergent dialysis.  I suspect that she has become uremic and encephalopathic from this.   [CG]    Clinical Course User Index [CG] Kinnie Feil, PA-C    Patient is EMR reviewed to obtain pertinent PMH.  She is history of GI bleed hematochezia, melena in the past in setting of anemia of chronic disease.  Her hemoglobin has dropped as low as 4.  In the past patient has stated she is Jehovah's Witness and has refused transfusion.  Flex sigmoidoscopy and capsule endoscopy on 6/18 were both negative.  Ultrasound elastography on 6/22 revealed cirrhotic changes of the liver.  She gets Aranesp and ferric citrate with dialysis per nephrology.   Exam shows burgandy stools, non tender non bleeding hemorrhoids. HD stable. No CP, SOB, light headedness, abdominal pain, diarrhea.  Will obtain labs including Hgb.  Discussed possible need for transfusion today with patient and she adamantly declined this. States if her hemoglobin is critically low and life threatening she would be ok with dying before getting a  transfusion.   ER work up reviewed by me remarkable for K 7.3, hypermagnesemia Mg 2.5. EKG with peaked T-waves 2/2 hyperkalemia. All likely from missed dialysis. BUN 110, Creatinine 10.87.  She has no CP, crackles, SOB and doesn't look hypervolemic so CXR not needed.  1805: As I was walking by patient's room she was noted to be vomiting off side of her bed. RN/EMT not aware of this. Large amount of non bloody emesis on floor w/o coffee ground emesis.  Pt states she just got nauseous and vomiting, feels hot. Definite decline in mentation noted. Confused could not tell me why she is here anymore. Denied pain anywhere. Can follow very simple commands but needs help sitting up in bed.  Suspect decreased mentation 2/2 uremia.  Neuro intact without focal deficits. TIA/CVA unlikely.   I think pt will do best with admission for recheck H/H, GI bleed monitoring, encephalopathy likely uremic requiring urgent HD.   Pt admitted by Dr Lonny Prude. Nephro wants to dialyze pt stat, was told they do not require COVID result prior to HD. COVID pending.   Final Clinical Impressions(s) / ED Diagnoses   Final diagnoses:  Rectal bleeding  Hyperkalemia  Acute confusional state    ED Discharge Orders    None       Kinnie Feil, PA-C 03/10/19 1815    Tegeler, Gwenyth Allegra, MD 03/11/19 [phone]

## 2019-03-11 ENCOUNTER — Encounter (HOSPITAL_COMMUNITY): Payer: Self-pay

## 2019-03-11 ENCOUNTER — Other Ambulatory Visit: Payer: Self-pay

## 2019-03-11 DIAGNOSIS — N19 Unspecified kidney failure: Secondary | ICD-10-CM | POA: Diagnosis not present

## 2019-03-11 DIAGNOSIS — N186 End stage renal disease: Secondary | ICD-10-CM | POA: Diagnosis not present

## 2019-03-11 LAB — GLUCOSE, CAPILLARY
Glucose-Capillary: 108 mg/dL — ABNORMAL HIGH (ref 70–99)
Glucose-Capillary: 162 mg/dL — ABNORMAL HIGH (ref 70–99)
Glucose-Capillary: 209 mg/dL — ABNORMAL HIGH (ref 70–99)
Glucose-Capillary: 92 mg/dL (ref 70–99)
Glucose-Capillary: 92 mg/dL (ref 70–99)
Glucose-Capillary: 98 mg/dL (ref 70–99)

## 2019-03-11 LAB — CBC
HCT: 26.1 % — ABNORMAL LOW (ref 36.0–46.0)
Hemoglobin: 8 g/dL — ABNORMAL LOW (ref 12.0–15.0)
MCH: 32.9 pg (ref 26.0–34.0)
MCHC: 30.7 g/dL (ref 30.0–36.0)
MCV: 107.4 fL — ABNORMAL HIGH (ref 80.0–100.0)
Platelets: 141 10*3/uL — ABNORMAL LOW (ref 150–400)
RBC: 2.43 MIL/uL — ABNORMAL LOW (ref 3.87–5.11)
RDW: 17.1 % — ABNORMAL HIGH (ref 11.5–15.5)
WBC: 3.1 10*3/uL — ABNORMAL LOW (ref 4.0–10.5)
nRBC: 1 % — ABNORMAL HIGH (ref 0.0–0.2)

## 2019-03-11 LAB — COMPREHENSIVE METABOLIC PANEL
ALT: 44 U/L (ref 0–44)
AST: 44 U/L — ABNORMAL HIGH (ref 15–41)
Albumin: 3.3 g/dL — ABNORMAL LOW (ref 3.5–5.0)
Alkaline Phosphatase: 106 U/L (ref 38–126)
Anion gap: 17 — ABNORMAL HIGH (ref 5–15)
BUN: 55 mg/dL — ABNORMAL HIGH (ref 6–20)
CO2: 25 mmol/L (ref 22–32)
Calcium: 8.5 mg/dL — ABNORMAL LOW (ref 8.9–10.3)
Chloride: 95 mmol/L — ABNORMAL LOW (ref 98–111)
Creatinine, Ser: 7.16 mg/dL — ABNORMAL HIGH (ref 0.44–1.00)
GFR calc Af Amer: 7 mL/min — ABNORMAL LOW (ref >60)
GFR calc non Af Amer: 6 mL/min — ABNORMAL LOW (ref >60)
Glucose, Bld: 95 mg/dL (ref 70–99)
Potassium: 4.5 mmol/L (ref 3.5–5.1)
Sodium: 137 mmol/L (ref 135–145)
Total Bilirubin: 1.2 mg/dL (ref 0.3–1.2)
Total Protein: 6.8 g/dL (ref 6.5–8.1)

## 2019-03-11 MED ORDER — INSULIN ASPART 100 UNIT/ML ~~LOC~~ SOLN
0.0000 [IU] | Freq: Three times a day (TID) | SUBCUTANEOUS | Status: DC
  Administered 2019-03-12: 2 [IU] via SUBCUTANEOUS
  Administered 2019-03-12: 5 [IU] via SUBCUTANEOUS

## 2019-03-11 MED ORDER — MUPIROCIN 2 % EX OINT
1.0000 "application " | TOPICAL_OINTMENT | Freq: Two times a day (BID) | CUTANEOUS | Status: DC
  Administered 2019-03-11 (×2): 1 via NASAL
  Filled 2019-03-11: qty 22

## 2019-03-11 MED ORDER — CHLORHEXIDINE GLUCONATE CLOTH 2 % EX PADS
6.0000 | MEDICATED_PAD | Freq: Every day | CUTANEOUS | Status: DC
  Administered 2019-03-11: 6 via TOPICAL

## 2019-03-11 MED ORDER — CHLORHEXIDINE GLUCONATE CLOTH 2 % EX PADS
6.0000 | MEDICATED_PAD | Freq: Every day | CUTANEOUS | Status: DC
  Administered 2019-03-12: 6 via TOPICAL

## 2019-03-11 NOTE — Progress Notes (Signed)
Renal Navigator spoke with OP HD clinic/[facility] and arranged for patient to have OP HD treatment on 03/12/19 at 1pm if discharged by this time. Renal PA/S. Collins informed.  Alphonzo Cruise, [place] Renal Navigator [phone]

## 2019-03-11 NOTE — Plan of Care (Signed)
  Problem: Education: Goal: Knowledge of General Education information will improve Description: Including pain rating scale, medication(s)/side effects and non-pharmacologic comfort measures Outcome: Progressing   Problem: Health Behavior/Discharge Planning: Goal: Ability to manage health-related needs will improve Outcome: Progressing   Problem: Activity: Goal: Risk for activity intolerance will decrease Outcome: Progressing   

## 2019-03-11 NOTE — Progress Notes (Signed)
HD 03/12/2019 per  Dr Carolin Sicks, Darron Doom, RN at [phone] of above

## 2019-03-11 NOTE — Progress Notes (Signed)
PROGRESS NOTE    [name]  ATF:[id number] DOB: 14-Nov-1965 DOA: 03/10/2019 PCP: Rocco Serene, MD   Brief Narrative: [name] is a 53 y.o. female with medical history significant of femalewith medical history significant ofESDR-HD (TTS),hypertension, hyperlipidemia, diabetes mellitus, TIA, GERD, gout, depression, anxiety, bipolar disorder, HCV, anemia, GI bleeding, polysubstance abuse, tobacco abuse, CHF with EF of 45%. Patient presented secondary to maroon colored stool and found to be uremic.   Assessment & Plan:   Active Problems:   Uremia   Uremia Secondary to missed HD which is a recurrent issue. Nephrology consulted and recommending emergent HD. Now resolved. -Nephrology recommendations  Hyperkalemia Secondary to above. EKG changes. Given Calcium Gluconate. Improved with HD -HD per nephrology  Chronic combined systolic and diastolic heart failure Fluid overload secondary to HD non-adherence. Management per HD  Hematochezia Fecal occult positive. This is a chronic issue with multiple workups and no current etiology. CBC stable. -Will hold off on GI consult for now  Anemia of chronic disease Chronic blood loss anemia Hemoglobin stable.   ESRD -HD as mentioned above  Diabetes mellitus, type 2 Last hemoglobin A1C of 5.6. On Lantus as an outpatient -Renal/carb diet -SSI  Mood disorder Patient is on high doses of Cymbalta which is not recommended in HD patients -Decreased to Cymbalta 60 mg daily; will likely need continued titration   DVT prophylaxis: SCDs Code Status:   Code Status: Full Code Family Communication: None Disposition Plan: Discharge in AM after HD   Consultants:   Nephrology  Procedures:   HD  Antimicrobials:  None    Subjective: No issues this morning.  Objective: Vitals:   03/10/19 2020 03/10/19 2128 03/11/19 0516 03/11/19 1501  BP: (!) 160/84 (!) 193/94 (!) 155/63 (!) 155/63  Pulse: 90 100 92 92  Resp: 18  18 18 18   Temp: 98.4 F (36.9 C) 98.3 F (36.8 C) 98.6 F (37 C) 98.6 F (37 C)  TempSrc: Oral  Oral Oral  SpO2: 98% 93% 99%   Weight:  72.5 kg  72.5 kg  Height:    5\' 4"  (1.626 m)    Intake/Output Summary (Last 24 hours) at 03/11/2019 1745 Last data filed at 03/11/2019 1649 Gross per 24 hour  Intake 0 ml  Output 2000 ml  Net -2000 ml   Filed Weights   03/10/19 1640 03/10/19 2128 03/11/19 1501  Weight: 72.5 kg 72.5 kg 72.5 kg    Examination:  General exam: Appears calm and comfortable Respiratory system: Clear to auscultation. Respiratory effort normal. Cardiovascular system: S1 & S2 heard, RRR. No murmurs, rubs, gallops or clicks. Gastrointestinal system: Abdomen is nondistended, soft and nontender. No organomegaly or masses felt. Normal bowel sounds heard. Central nervous system: Alert and oriented. No focal neurological deficits. Extremities: No edema. No calf tenderness Skin: No cyanosis. No rashes Psychiatry: Judgement and insight appear normal. Mood & affect appropriate.     Data Reviewed: I have personally reviewed following labs and imaging studies  CBC: Recent Labs  Lab 03/10/19 1254 03/11/19 0745  WBC 4.9 3.1*  NEUTROABS 3.4  --   HGB 8.0* 8.0*  HCT 26.3* 26.1*  MCV 110.0* 107.4*  PLT 155 270*   Basic Metabolic Panel: Recent Labs  Lab 03/10/19 1254 03/11/19 0745  NA 133* 137  K 7.3* 4.5  CL 87* 95*  CO2 24 25  GLUCOSE 168* 95  BUN 110* 55*  CREATININE 10.87* 7.16*  CALCIUM 8.8* 8.5*  MG 2.5*  --  GFR: Estimated Creatinine Clearance: 9 mL/min (A) (by C-G formula based on SCr of 7.16 mg/dL (H)). Liver Function Tests: Recent Labs  Lab 03/10/19 1254 03/11/19 0745  AST 49* 44*  ALT 50* 44  ALKPHOS 110 106  BILITOT 1.9* 1.2  PROT 7.4 6.8  ALBUMIN 3.7 3.3*   No results for input(s): LIPASE, AMYLASE in the last 168 hours. No results for input(s): AMMONIA in the last 168 hours. Coagulation Profile: Recent Labs  Lab 03/10/19 1254   INR 1.5*   Cardiac Enzymes: No results for input(s): CKTOTAL, CKMB, CKMBINDEX, TROPONINI in the last 168 hours. BNP (last 3 results) No results for input(s): PROBNP in the last 8760 hours. HbA1C: No results for input(s): HGBA1C in the last 72 hours. CBG: Recent Labs  Lab 03/11/19 0002 03/11/19 0541 03/11/19 0721 03/11/19 1123 03/11/19 1609  GLUCAP 162* 108* 92 92 98   Lipid Profile: No results for input(s): CHOL, HDL, LDLCALC, TRIG, CHOLHDL, LDLDIRECT in the last 72 hours. Thyroid Function Tests: No results for input(s): TSH, T4TOTAL, FREET4, T3FREE, THYROIDAB in the last 72 hours. Anemia Panel: No results for input(s): VITAMINB12, FOLATE, FERRITIN, TIBC, IRON, RETICCTPCT in the last 72 hours. Sepsis Labs: No results for input(s): PROCALCITON, LATICACIDVEN in the last 168 hours.  Recent Results (from the past 240 hour(s))  SARS CORONAVIRUS 2 Nasal Swab Aptima Multi Swab     Status: None   Collection Time: 03/10/19  3:45 PM   Specimen: Aptima Multi Swab; Nasal Swab  Result Value Ref Range Status   SARS Coronavirus 2 NEGATIVE NEGATIVE Final    Comment: (NOTE) SARS-CoV-2 target nucleic acids are NOT DETECTED. The SARS-CoV-2 RNA is generally detectable in upper and lower respiratory specimens during the acute phase of infection. Negative results do not preclude SARS-CoV-2 infection, do not rule out co-infections with other pathogens, and should not be used as the sole basis for treatment or other patient management decisions. Negative results must be combined with clinical observations, patient history, and epidemiological information. The expected result is Negative. Fact Sheet for Patients: SugarRoll.be Fact Sheet for Healthcare Providers: https://www.woods-mathews.com/ This test is not yet approved or cleared by the [place] FDA and  has been authorized for detection and/or diagnosis of SARS-CoV-2 by FDA under an Emergency  Use Authorization (EUA). This EUA will remain  in effect (meaning this test can be used) for the duration of the COVID-19 declaration under Section 56 4(b)(1) of the Act, 21 U.S.C. section 360bbb-3(b)(1), unless the authorization is terminated or revoked sooner. Performed at [facility], [place] [address], [place], [place] [address]   MRSA PCR Screening     Status: Abnormal   Collection Time: 03/10/19  9:49 PM   Specimen: Nasal Mucosa; Nasopharyngeal  Result Value Ref Range Status   MRSA by PCR POSITIVE (A) NEGATIVE Final    Comment:        The GeneXpert MRSA Assay (FDA approved for NASAL specimens only), is one component of a comprehensive MRSA colonization surveillance program. It is not intended to diagnose MRSA infection nor to guide or monitor treatment for MRSA infections. RESULT CALLED TO, READ BACK BY AND VERIFIED WITH: Gypsy Lore RN 03/10/19 2338 JDW Performed at [facility], [place] [address], [place], [place] [address]          Radiology Studies: No results found.      Scheduled Meds: . atorvastatin  40 mg Oral QHS  . calcitRIOL  2.25 mcg Oral Q T,Th,Sa-HD  .  Chlorhexidine Gluconate Cloth  6 each Topical Q0600  . [START ON 03/12/2019] Chlorhexidine Gluconate Cloth  6 each Topical Q0600  . clotrimazole  1 Applicatorful Vaginal QHS  . insulin aspart  5 Units Intravenous Once   And  . dextrose  1 ampule Intravenous Once  . DULoxetine  60 mg Oral Daily  . ferric citrate  420 mg Oral TID WC  . hydrocortisone  1 application Rectal BID  . insulin aspart  0-9 Units Subcutaneous Q4H  . labetalol  200 mg Oral BID  . mupirocin ointment  1 application Nasal BID  . ondansetron (ZOFRAN) IV  4 mg Intravenous Once  . pantoprazole  40 mg Oral Daily  . traZODone  25 mg Oral QHS   Continuous Infusions:   LOS: 0 days     Cordelia Poche, MD [facility] 03/11/2019, 5:45 PM  If 7PM-7AM, please contact night-coverage [email or web address]

## 2019-03-11 NOTE — Progress Notes (Addendum)
[name] Progress Note   Subjective:   Seen in room. K+ improved to 4.5. Alert, oriented to person and situation. Reports she is tired today and still having abdominal pain, says her stomach seems bigger lately. Denies nausea or diarrhea at present. Denies SOB, dyspnea, CP, edema. Patient also reported itching yesterday but denies any itching today. Would not cooperate with orientation questions today, said she was tired.   Objective Vitals:   03/10/19 2000 03/10/19 2020 03/10/19 2128 03/11/19 0516  BP: 136/78 (!) 160/84 (!) 193/94 (!) 155/63  Pulse: 88 90 100 92  Resp: 18 18 18 18   Temp:  98.4 F (36.9 C) 98.3 F (36.8 C) 98.6 F (37 C)  TempSrc:  Oral  Oral  SpO2:  98% 93% 99%  Weight:   72.5 kg    Physical Exam General: Well developed female, sleeping but easily arousable, in NAD Heart: RRR, no murmurs, rubs or gallops Lungs: CTA bilaterally without wheezing, rhonchi or rales Abdomen: Round, soft, non-tender. +BS Extremities: Trace edema b/l lower extremities Dialysis Access:  LUE AVG + thrill  Additional Objective Labs: Basic Metabolic Panel: Recent Labs  Lab 03/10/19 1254 03/11/19 0745  NA 133* 137  K 7.3* 4.5  CL 87* 95*  CO2 24 25  GLUCOSE 168* 95  BUN 110* 55*  CREATININE 10.87* 7.16*  CALCIUM 8.8* 8.5*   Liver Function Tests: Recent Labs  Lab 03/10/19 1254 03/11/19 0745  AST 49* 44*  ALT 50* 44  ALKPHOS 110 106  BILITOT 1.9* 1.2  PROT 7.4 6.8  ALBUMIN 3.7 3.3*   CBC: Recent Labs  Lab 03/10/19 1254 03/11/19 0745  WBC 4.9 3.1*  NEUTROABS 3.4  --   HGB 8.0* 8.0*  HCT 26.3* 26.1*  MCV 110.0* 107.4*  PLT 155 141*   Blood Culture    Component Value Date/Time   SDES BLOOD RIGHT UPPER ARM 02/25/2017 1927   SPECREQUEST  02/25/2017 1927    BOTTLES DRAWN AEROBIC AND ANAEROBIC Blood Culture adequate volume   CULT NO GROWTH 5 DAYS 02/25/2017 1927   REPTSTATUS 03/02/2017 FINAL 02/25/2017 1927    Cardiac Enzymes: No results  for input(s): CKTOTAL, CKMB, CKMBINDEX, TROPONINI in the last 168 hours. CBG: Recent Labs  Lab 03/10/19 2127 03/11/19 0002 03/11/19 0541 03/11/19 0721 03/11/19 1123  GLUCAP 171* 162* 108* 92 92   Medications:  . atorvastatin  40 mg Oral QHS  . calcitRIOL  2.25 mcg Oral Q T,Th,Sa-HD  . Chlorhexidine Gluconate Cloth  6 each Topical Q0600  . clotrimazole  1 Applicatorful Vaginal QHS  . insulin aspart  5 Units Intravenous Once   And  . dextrose  1 ampule Intravenous Once  . DULoxetine  60 mg Oral Daily  . ferric citrate  420 mg Oral TID WC  . hydrocortisone  1 application Rectal BID  . insulin aspart  0-9 Units Subcutaneous Q4H  . labetalol  200 mg Oral BID  . mupirocin ointment  1 application Nasal BID  . ondansetron (ZOFRAN) IV  4 mg Intravenous Once  . pantoprazole  40 mg Oral Daily  . traZODone  25 mg Oral QHS    Dialysis Orders: Center: [facility]. Time: 3.5h; 160 NRe; BFR 300/DFR 600; EDW 72kg; 2K/2Ca; UF Profile 4; LUE AVG Heparin: None Venofer 100mg  IV q HD x 10 treatments (only received 3 doses so far due to missed dialysis sessions) Mircera 247mcg IV q 2 weeks- last dose 7/29 Calcitriol 2.25 mcg PO TIW  with dialysis  Assessment/Plan: 1. Hyperkalemia: K+ 7.3 on presentation with peaked T waves, due to dialysis non-compliance, improved to 4.5 today after urgent dialysis last night. Attempted to discuss non-compliance issue but patient not willing to engage in conversation at present.  2. Encephalopathy: Not oriented to place or situation on 8/5. May be due to uremia from missed dialysis. Mental status appears improved today. Cymbalta dose also decreased.  3. Nausea/Vomiting: Likely due to uremia. Given Zofran yesterday. Denies nausea at present.  4.  ESRD:  TTS, prior to admission was 7/30. Hyperkalemic and likely uremic on admission. Was also admitted 7/22-7/24 for hyperkalemia from missed/shortened treatments. Abbreviated HD today then resume  regular schedule tomorrow.  5.  Hypertension/volume: Blood pressure elevated, trace edema on exam. She is currently 0.5kg above her EDW. UF as tolerated today and tomorrow.  6.  Anemia/BRBPR: Extensive GI bleed history, does not accept blood products. Hemoglobin is actually stable today at 8.0. Not due for ESA yet. Transfuse PRN. 7.  Metabolic bone disease: Calcium 8.5. Continue calcitriol and auryxia.  8.  Nutrition:  Albumin 3.3. Renal diet with fluid restrictions  Anice Paganini, PA-C 03/11/2019, 12:54 PM  [facility] Pager: [phone]  Pt seen, examined and agree w A/P as above.  Kelly Splinter  MD 03/11/2019, 1:27 PM

## 2019-03-11 NOTE — Progress Notes (Signed)
Attempted to meet with patient to introduce myself in person, as we have spoken on the phone in the past, and hopefully discuss non-compliance at OP HD clinic, but patient was resting and asked that Renal Navigator come back at a later time. Renal Navigator will attempt to meet with patient again in the morning.  Renal Navigator discussed patient's OBS status and possibility for d/c readiness tomorrow, 03/12/19. Renal Navigator left message at OP HD clinic/[facility] to request second shift HD seat in the event that she is discharged on 8/7 and can receive HD at her OP HD clinic tomorrow. Renal Navigator will follow closely.  Alphonzo Cruise, [place] Renal Navigator [phone]

## 2019-03-12 DIAGNOSIS — D500 Iron deficiency anemia secondary to blood loss (chronic): Secondary | ICD-10-CM | POA: Diagnosis present

## 2019-03-12 DIAGNOSIS — F05 Delirium due to known physiological condition: Secondary | ICD-10-CM | POA: Diagnosis present

## 2019-03-12 DIAGNOSIS — E1142 Type 2 diabetes mellitus with diabetic polyneuropathy: Secondary | ICD-10-CM | POA: Diagnosis present

## 2019-03-12 DIAGNOSIS — G9349 Other encephalopathy: Secondary | ICD-10-CM | POA: Diagnosis present

## 2019-03-12 DIAGNOSIS — N19 Unspecified kidney failure: Secondary | ICD-10-CM | POA: Diagnosis not present

## 2019-03-12 DIAGNOSIS — E1122 Type 2 diabetes mellitus with diabetic chronic kidney disease: Secondary | ICD-10-CM | POA: Diagnosis present

## 2019-03-12 DIAGNOSIS — Z20828 Contact with and (suspected) exposure to other viral communicable diseases: Secondary | ICD-10-CM | POA: Diagnosis present

## 2019-03-12 DIAGNOSIS — F319 Bipolar disorder, unspecified: Secondary | ICD-10-CM | POA: Diagnosis present

## 2019-03-12 DIAGNOSIS — K219 Gastro-esophageal reflux disease without esophagitis: Secondary | ICD-10-CM | POA: Diagnosis present

## 2019-03-12 DIAGNOSIS — G894 Chronic pain syndrome: Secondary | ICD-10-CM | POA: Diagnosis present

## 2019-03-12 DIAGNOSIS — D631 Anemia in chronic kidney disease: Secondary | ICD-10-CM | POA: Diagnosis present

## 2019-03-12 DIAGNOSIS — I5042 Chronic combined systolic (congestive) and diastolic (congestive) heart failure: Secondary | ICD-10-CM | POA: Diagnosis present

## 2019-03-12 DIAGNOSIS — I132 Hypertensive heart and chronic kidney disease with heart failure and with stage 5 chronic kidney disease, or end stage renal disease: Secondary | ICD-10-CM | POA: Diagnosis present

## 2019-03-12 DIAGNOSIS — F419 Anxiety disorder, unspecified: Secondary | ICD-10-CM | POA: Diagnosis present

## 2019-03-12 DIAGNOSIS — B1920 Unspecified viral hepatitis C without hepatic coma: Secondary | ICD-10-CM | POA: Diagnosis present

## 2019-03-12 DIAGNOSIS — G40909 Epilepsy, unspecified, not intractable, without status epilepticus: Secondary | ICD-10-CM | POA: Diagnosis present

## 2019-03-12 DIAGNOSIS — K921 Melena: Secondary | ICD-10-CM | POA: Diagnosis present

## 2019-03-12 DIAGNOSIS — F17210 Nicotine dependence, cigarettes, uncomplicated: Secondary | ICD-10-CM | POA: Diagnosis present

## 2019-03-12 DIAGNOSIS — N186 End stage renal disease: Secondary | ICD-10-CM | POA: Diagnosis present

## 2019-03-12 DIAGNOSIS — N2581 Secondary hyperparathyroidism of renal origin: Secondary | ICD-10-CM | POA: Diagnosis present

## 2019-03-12 DIAGNOSIS — E8779 Other fluid overload: Secondary | ICD-10-CM | POA: Diagnosis present

## 2019-03-12 DIAGNOSIS — D638 Anemia in other chronic diseases classified elsewhere: Secondary | ICD-10-CM | POA: Diagnosis present

## 2019-03-12 DIAGNOSIS — E785 Hyperlipidemia, unspecified: Secondary | ICD-10-CM | POA: Diagnosis present

## 2019-03-12 DIAGNOSIS — K645 Perianal venous thrombosis: Secondary | ICD-10-CM | POA: Diagnosis present

## 2019-03-12 LAB — CBC
HCT: 26.4 % — ABNORMAL LOW (ref 36.0–46.0)
Hemoglobin: 8 g/dL — ABNORMAL LOW (ref 12.0–15.0)
MCH: 33.6 pg (ref 26.0–34.0)
MCHC: 30.3 g/dL (ref 30.0–36.0)
MCV: 110.9 fL — ABNORMAL HIGH (ref 80.0–100.0)
Platelets: 121 10*3/uL — ABNORMAL LOW (ref 150–400)
RBC: 2.38 MIL/uL — ABNORMAL LOW (ref 3.87–5.11)
RDW: 17.2 % — ABNORMAL HIGH (ref 11.5–15.5)
WBC: 3.3 10*3/uL — ABNORMAL LOW (ref 4.0–10.5)
nRBC: 0 % (ref 0.0–0.2)

## 2019-03-12 LAB — RENAL FUNCTION PANEL
Albumin: 3.3 g/dL — ABNORMAL LOW (ref 3.5–5.0)
Anion gap: 19 — ABNORMAL HIGH (ref 5–15)
BUN: 64 mg/dL — ABNORMAL HIGH (ref 6–20)
CO2: 22 mmol/L (ref 22–32)
Calcium: 8.9 mg/dL (ref 8.9–10.3)
Chloride: 96 mmol/L — ABNORMAL LOW (ref 98–111)
Creatinine, Ser: 8.42 mg/dL — ABNORMAL HIGH (ref 0.44–1.00)
GFR calc Af Amer: 6 mL/min — ABNORMAL LOW (ref >60)
GFR calc non Af Amer: 5 mL/min — ABNORMAL LOW (ref >60)
Glucose, Bld: 187 mg/dL — ABNORMAL HIGH (ref 70–99)
Phosphorus: 6.2 mg/dL — ABNORMAL HIGH (ref 2.5–4.6)
Potassium: 4.2 mmol/L (ref 3.5–5.1)
Sodium: 137 mmol/L (ref 135–145)

## 2019-03-12 LAB — GLUCOSE, CAPILLARY
Glucose-Capillary: 195 mg/dL — ABNORMAL HIGH (ref 70–99)
Glucose-Capillary: 270 mg/dL — ABNORMAL HIGH (ref 70–99)

## 2019-03-12 MED ORDER — DULOXETINE HCL 30 MG PO CPEP: 60.0000 mg | ORAL_CAPSULE | Freq: Every day | ORAL | Status: DC

## 2019-03-12 MED ORDER — DOXYCYCLINE HYCLATE 100 MG PO TABS
100.0000 mg | ORAL_TABLET | Freq: Two times a day (BID) | ORAL | Status: DC
  Administered 2019-03-12: 100 mg via ORAL
  Filled 2019-03-12: qty 1

## 2019-03-12 MED ORDER — CALCITRIOL 0.5 MCG PO CAPS
ORAL_CAPSULE | ORAL | Status: AC
  Administered 2019-03-12: 2.25 ug
  Filled 2019-03-12: qty 4

## 2019-03-12 MED ORDER — BENZONATATE 200 MG PO CAPS: 200.0000 mg | ORAL_CAPSULE | Freq: Two times a day (BID) | ORAL | 0 refills | Status: DC | PRN

## 2019-03-12 MED ORDER — BENZONATATE 100 MG PO CAPS: 200.0000 mg | ORAL_CAPSULE | Freq: Two times a day (BID) | ORAL | Status: DC | PRN

## 2019-03-12 MED ORDER — DOXYCYCLINE HYCLATE 100 MG PO TABS: 100.0000 mg | ORAL_TABLET | Freq: Two times a day (BID) | ORAL | 0 refills | Status: AC

## 2019-03-12 MED ORDER — CALCITRIOL 0.25 MCG PO CAPS
ORAL_CAPSULE | ORAL | Status: AC
  Filled 2019-03-12: qty 1

## 2019-03-12 MED ORDER — CALCITRIOL 0.5 MCG PO CAPS: 2.2500 ug | ORAL_CAPSULE | Freq: Every day | ORAL | Status: DC

## 2019-03-12 MED FILL — DOXYCYCLINE HYCLATE 100 MG: 100 | 6 days supply | Qty: 12 | Fill #0

## 2019-03-12 MED FILL — BENZONATATE 200 MG CAPS: 200 | 10 days supply | Qty: 20 | Fill #0

## 2019-03-12 NOTE — Discharge Instructions (Signed)
[name],  You were in the hospital because of missing hemodialysis. Please do not miss your HD.

## 2019-03-12 NOTE — Plan of Care (Signed)
  Problem: Education: Goal: Knowledge of General Education information will improve Description: Including pain rating scale, medication(s)/side effects and non-pharmacologic comfort measures Outcome: Progressing   Problem: Activity: Goal: Risk for activity intolerance will decrease Outcome: Progressing   

## 2019-03-12 NOTE — Progress Notes (Signed)
Patient is requesting to speak with the doctor about abrasions on her arms and head. She states, "I am afraid its MRSA and I won't the doctor to come up here and look at it." Lonny Prude, MD notified and at bedside.

## 2019-03-12 NOTE — Progress Notes (Addendum)
[facility] Progress Note   Subjective: Awake, very pleasant. Asking for more volume to be removed and agree with this. Denies SOB at present, no new complaints today. Discussed compliance-says she will try to do better.   Objective Vitals:   03/12/19 0730 03/12/19 0800 03/12/19 0830 03/12/19 0900  BP: (!) 184/81 (!) 187/87 (!) 187/75 (!) 191/89  Pulse: 79 86 83 86  Resp:      Temp:      TempSrc:      SpO2:      Weight:      Height:       Physical Exam General: Chronically ill female in NAD Heart:S1,S2, RRR Lungs:  CTAB Abdomen: slightly protuberant, soft, NT Extremities: No LE edema but edema in hands, face.  Dialysis Access: L AVG cannulated at present  Additional Objective Labs: Basic Metabolic Panel: Recent Labs  Lab 03/10/19 1254 03/11/19 0745 03/12/19 0714  NA 133* 137 137  K 7.3* 4.5 4.2  CL 87* 95* 96*  CO2 '24 25 22  '$ GLUCOSE 168* 95 187*  BUN 110* 55* 64*  CREATININE 10.87* 7.16* 8.42*  CALCIUM 8.8* 8.5* 8.9  PHOS  --   --  6.2*   Liver Function Tests: Recent Labs  Lab 03/10/19 1254 03/11/19 0745 03/12/19 0714  AST 49* 44*  --   ALT 50* 44  --   ALKPHOS 110 106  --   BILITOT 1.9* 1.2  --   PROT 7.4 6.8  --   ALBUMIN 3.7 3.3* 3.3*   No results for input(s): LIPASE, AMYLASE in the last 168 hours. CBC: Recent Labs  Lab 03/10/19 1254 03/11/19 0745 03/12/19 0714  WBC 4.9 3.1* 3.3*  NEUTROABS 3.4  --   --   HGB 8.0* 8.0* 8.0*  HCT 26.3* 26.1* 26.4*  MCV 110.0* 107.4* 110.9*  PLT 155 141* 121*   Blood Culture    Component Value Date/Time   SDES BLOOD RIGHT UPPER ARM 02/25/2017 1927   SPECREQUEST  02/25/2017 1927    BOTTLES DRAWN AEROBIC AND ANAEROBIC Blood Culture adequate volume   CULT NO GROWTH 5 DAYS 02/25/2017 1927   REPTSTATUS 03/02/2017 FINAL 02/25/2017 1927    Cardiac Enzymes: No results for input(s): CKTOTAL, CKMB, CKMBINDEX, TROPONINI in the last 168 hours. CBG: Recent Labs  Lab 03/11/19 0541  03/11/19 0721 03/11/19 1123 03/11/19 1609 03/11/19 2202  GLUCAP 108* 92 92 98 209*   Iron Studies: No results for input(s): IRON, TIBC, TRANSFERRIN, FERRITIN in the last 72 hours. '@lablastinr3'$ @ Studies/Results: No results found. Medications:  . atorvastatin  40 mg Oral QHS  . calcitRIOL  2.25 mcg Oral Q T,Th,Sa-HD  . calcitRIOL  2.25 mcg Oral Daily  . Chlorhexidine Gluconate Cloth  6 each Topical Q0600  . Chlorhexidine Gluconate Cloth  6 each Topical Q0600  . clotrimazole  1 Applicatorful Vaginal QHS  . DULoxetine  60 mg Oral Daily  . ferric citrate  420 mg Oral TID WC  . hydrocortisone  1 application Rectal BID  . insulin aspart  0-9 Units Subcutaneous TID WC  . labetalol  200 mg Oral BID  . mupirocin ointment  1 application Nasal BID  . ondansetron (ZOFRAN) IV  4 mg Intravenous Once  . pantoprazole  40 mg Oral Daily  . traZODone  25 mg Oral QHS     Dialysis:  [facility]  3.5h  300/600  72kg  2/2 bath  P4  LUE AVG  Hep none Venofer '100mg'$  IV q HD x  10 treatments (only received 3 doses so far due to missed dialysis sessions) Mircera 223mg IV q 2 weeks- last dose 7/29 Calcitriol 2.25 mcg PO TIW with dialysis  Assessment/Plan: 1. Hyperkalemia:K+ 7.3 on presentation with peaked T waves, due to dialysis non-compliance. Resolved with HD.  2. Encephalopathy:Admitted with AMS-BUN 110 on adm. Most likely uremia in setting of missed Hd. Cymbalta should not be used with EGFR <30. Now at baseline.  3. Nausea/Vomiting:Resolved with HD. Probably uremia.   4. ESRD:T,Th, S via AVG. HD today off schedule. K+ 4.2. Using 2.0 K bath. Should be discharged today. HD tomorrow at OP center.  5. Hypertension/volume:Seen on HD extremely hypertensive, UFG only 2.5. Face and hands are puffy. Challenge and lower EDW. UFG now 3.5, tolerating well. Lower EDW on DC.   6. Anemia/BRBPR: Extensive GI bleed history, does not accept blood products. Hemoglobin is actually stable today at 8.0. Not due for  ESA yet. Transfuse PRN. 7. Metabolic bone disease:Calcium 8.5. Continue calcitriol and auryxia. 8. Nutrition:Albumin 3.3. Renal diet with fluid restrictions  [name] H. Brown NP-C 03/12/2019, 9:23 AM  [facility] [phone] Pt seen, examined and agree w A/P as above.  RKelly Splinter MD 03/12/2019, 2:28 PM

## 2019-03-12 NOTE — Clinical Social Work Note (Signed)
Patient discharging home and cab voucher requested after patient unable to find other transport. CSW signing off as no other SW interventions needed.  Korbin Notaro Givens, MSW, LCSW Licensed Clinical Social Worker [place] [phone]

## 2019-03-12 NOTE — Discharge Summary (Signed)
Physician Discharge Summary  [name] ZOX:[id number] DOB: [birth year] DOA: 03/10/2019  PCP: Rocco Serene, MD  Admit date: 03/10/2019 Discharge date: 03/12/2019  Admitted From: Home Disposition: Home  Recommendations for Outpatient Follow-up:  1. Follow up with PCP in 1 week 2. Please obtain BMP/CBC in one week 3. Please follow up on the following pending results: None  Home Health: None Equipment/Devices: None  Discharge Condition: Stable CODE STATUS: Full Diet recommendation: Renal diet   Brief/Interim Summary:  HPI: [name] is a 53 y.o. female with medical history significant of femalewith medical history significant ofESDR-HD (TTS),hypertension, hyperlipidemia, diabetes mellitus, TIA, GERD, gout, depression, anxiety, bipolar disorder, HCV, anemia, GI bleeding, polysubstance abuse, tobacco abuse, CHF with EF of 45%, who presents with shortness breath and chest pain.  History provided mostly via chart review secondary to patient's mental status.  Per discussion with the EDP, patient presented secondary to 1 day of GI bleeding consisting of maroon-colored stool.  When arrived to the ED, patient developed worsening confusion.  Lab work concerning for uremia.  Also with elevated potassium and peak T waves on EKG.  Nephrology was consulted with plans for emergent hemodialysis.   Hospital course:  Uremia Secondary tomissed HD which is a recurrent issue. Received HD with resolution of symptoms.  Hyperkalemia Secondary to above.EKG changes. Given Calcium Gluconate. Improved with HD.  Chronic combined systolic and diastolic heart failure Fluid overload secondary to HD non-adherence. Management per HD  Hematochezia Fecal occult positive. This is a chronic issue with multiple workups and no current etiology. CBC stable. No recurrent bleeding.  Anemia of chronic disease Chronic blood loss anemia Hemoglobin stable.  ESRD HD as mentioned above  Diabetes  mellitus, type 2 Last hemoglobin A1C of 5.6. On Lantus as an outpatient. Renal/carb diet. Discontinued Lantus on discharge. Continue SSI.  Mood disorder Patient is on high doses of Cymbalta which is not recommended in HD patients. Decreased to Cymbalta 60 mg daily; will likely need continued titration on discharge  Skin lesions Mostly healed. Will discharge with a short course of antibiotics.  Discharge Diagnoses:  Active Problems:   Uremia    Discharge Instructions  Discharge Instructions    Increase activity slowly   Complete by: As directed      Allergies as of 03/12/2019      Reactions   Penicillins Hives, Other (See Comments)   Has patient had a PCN reaction causing immediate rash, facial/tongue/throat swelling, SOB or lightheadedness with hypotension: Yes Has patient had a PCN reaction causing severe rash involving mucus membranes or skin necrosis: No Has patient had a PCN reaction that required hospitalization No Has patient had a PCN reaction occurring within the last 10 years: No If all of the above answers are "NO", then may proceed with Cephalosporin use. Pt tolerates cefepime and ceftriaxone   Gabapentin Itching, Swelling   Lyrica [pregabalin] Swelling, Other (See Comments)   Facial and leg swelling   Saphris [asenapine] Swelling, Other (See Comments)   Facial swelling   Tramadol Swelling, Other (See Comments)   Leg swelling   Adhesive [tape] Itching   SEVERE ITCHING   Latex Itching   SEVERE ITCHING   Other Other (See Comments)   NO BLOOD PRODUCTS; PATIENT IS A JEHOVAH'S WITNESS      Medication List    STOP taking these medications   insulin glargine 100 UNIT/ML injection Commonly known as: LANTUS     TAKE these medications   acetaminophen 325 MG tablet  Commonly known as: TYLENOL Take 2 tablets (650 mg total) by mouth every 6 (six) hours as needed for fever, headache or moderate pain.   albuterol 108 (90 Base) MCG/ACT inhaler Commonly known as:  VENTOLIN HFA Inhale 2 puffs into the lungs every 4 (four) hours as needed for wheezing or shortness of breath.   allopurinol 100 MG tablet Commonly known as: ZYLOPRIM Take 2 tablets (200 mg total) by mouth daily.   atorvastatin 40 MG tablet Commonly known as: LIPITOR Take 1 tablet (40 mg total) by mouth at bedtime.   benzonatate 200 MG capsule Commonly known as: TESSALON Take 1 capsule (200 mg total) by mouth 2 (two) times daily as needed for cough.   calcitRIOL 0.25 MCG capsule Commonly known as: ROCALTROL Take 4 capsules (1 mcg total) by mouth every dialysis (3 times per week on TTS).   Darbepoetin Alfa 200 MCG/0.4ML Sosy injection Commonly known as: ARANESP Inject 0.4 mLs (200 mcg total) into the vein every Wednesday with hemodialysis.   diphenhydrAMINE 25 mg capsule Commonly known as: BENADRYL Take 1 capsule (25 mg total) by mouth every 6 (six) hours as needed for up to 8 days for itching.   doxycycline 100 MG tablet Commonly known as: VIBRA-TABS Take 1 tablet (100 mg total) by mouth every 12 (twelve) hours for 6 days.   DULoxetine 30 MG capsule Commonly known as: CYMBALTA Take 2 capsules (60 mg total) by mouth daily. What changed: how much to take   ferric citrate 1 GM 210 MG(Fe) tablet Commonly known as: Auryxia Take 2 tablets (420 mg total) by mouth 3 (three) times daily with meals.   folic acid 1 MG tablet Commonly known as: FOLVITE Take 1 tablet (1 mg total) by mouth daily.   hydrocortisone 2.5 % rectal cream Commonly known as: ANUSOL-HC Place rectally 2 (two) times daily. What changed: how much to take   hydrOXYzine 25 MG tablet Commonly known as: ATARAX/VISTARIL Take 1 tablet (25 mg total) by mouth 3 (three) times daily as needed for itching or anxiety.   insulin aspart 100 UNIT/ML injection Commonly known as: novoLOG 0-9 Units, Subcutaneous, 3 times daily with meals CBG < 70: implement hypoglycemia protocol CBG 70 - 120: 0 units CBG 121 - 150: 1 unit  CBG 151 - 200: 2 units CBG 201 - 250: 3 units CBG 251 - 300: 5 units CBG 301 - 350: 7 units CBG 351 - 400: 9 units CBG > 400: call MD   labetalol 200 MG tablet Commonly known as: NORMODYNE Take 1 tablet (200 mg total) by mouth 2 (two) times daily.   lidocaine-prilocaine cream Commonly known as: EMLA Apply 1 application topically See admin instructions. On dialysis days   multivitamin Tabs tablet Take 1 tablet by mouth at bedtime.   mupirocin ointment 2 % Commonly known as: BACTROBAN Apply 1 application topically as needed (on arm).   oxymetazoline 0.05 % nasal spray Commonly known as: AFRIN Place 2 sprays into both nostrils 2 (two) times daily.   pantoprazole 40 MG tablet Commonly known as: PROTONIX Take 1 tablet (40 mg total) by mouth daily at 6 (six) AM for 30 days.   senna-docusate 8.6-50 MG tablet Commonly known as: Senokot-S Take 2 tablets by mouth at bedtime.   traZODone 50 MG tablet Commonly known as: DESYREL Take 0.5 tablets (25 mg total) by mouth at bedtime.      Follow-up Information    Rocco Serene, MD. Schedule an appointment as soon as possible for  a visit in 1 week(s).   Specialty: Internal Medicine Contact information: [place] [address] [phone]          Allergies  Allergen Reactions  . Penicillins Hives and Other (See Comments)    Has patient had a PCN reaction causing immediate rash, facial/tongue/throat swelling, SOB or lightheadedness with hypotension: Yes Has patient had a PCN reaction causing severe rash involving mucus membranes or skin necrosis: No Has patient had a PCN reaction that required hospitalization No Has patient had a PCN reaction occurring within the last 10 years: No If all of the above answers are "NO", then may proceed with Cephalosporin use. Pt tolerates cefepime and ceftriaxone  . Gabapentin Itching and Swelling  . Lyrica [Pregabalin] Swelling and Other (See Comments)    Facial and leg swelling   . Saphris [Asenapine] Swelling and Other (See Comments)    Facial swelling  . Tramadol Swelling and Other (See Comments)    Leg swelling  . Adhesive [Tape] Itching    SEVERE ITCHING  . Latex Itching    SEVERE ITCHING  . Other Other (See Comments)    NO BLOOD PRODUCTS; PATIENT IS A JEHOVAH'S WITNESS    Consultations:  Nephrology   Procedures/Studies: Dg Chest 2 View  Result Date: 02/24/2019 CLINICAL DATA:  Chest pain and shortness of breath, recently missed dialysis episode EXAM: CHEST - 2 VIEW COMPARISON:  03/17/2018 FINDINGS: Cardiac shadow is enlarged but stable consistent with a degree of volume overload. Mild vascular congestion is again seen without significant edema. Chronic blunting of left costophrenic angle is noted. No focal infiltrate is seen. IMPRESSION: Mild vascular congestion consistent with the recently missed dialysis episode. Chronic changes in the left base. Electronically Signed   By: Inez Catalina M.D.   On: 02/24/2019 23:25      Subjective: No issues overnight. Intermittently sleepy.  Discharge Exam: Vitals:   03/12/19 1040 03/12/19 1120  BP: (!) 188/66 (!) 176/67  Pulse: 84 88  Resp: 18 18  Temp: 98.5 F (36.9 C) 97.8 F (36.6 C)  SpO2: 98% 96%   Vitals:   03/12/19 1016 03/12/19 1027 03/12/19 1040 03/12/19 1120  BP: (!) 185/96 (!) 199/94 (!) 188/66 (!) 176/67  Pulse: 88 87 84 88  Resp:   18 18  Temp:   98.5 F (36.9 C) 97.8 F (36.6 C)  TempSrc:   Oral Oral  SpO2:   98% 96%  Weight:   69.2 kg   Height:        General: Pt is alert, awake, not in acute distress Cardiovascular: RRR, S1/S2 +, no rubs, no gallops Respiratory: CTA bilaterally, no wheezing, no rhonchi Abdominal: Soft, NT, ND, bowel sounds + Extremities: no edema, no cyanosis    The results of significant diagnostics from this hospitalization (including imaging, microbiology, ancillary and laboratory) are listed below for reference.     Microbiology: Recent Results (from  the past 240 hour(s))  SARS CORONAVIRUS 2 Nasal Swab Aptima Multi Swab     Status: None   Collection Time: 03/10/19  3:45 PM   Specimen: Aptima Multi Swab; Nasal Swab  Result Value Ref Range Status   SARS Coronavirus 2 NEGATIVE NEGATIVE Final    Comment: (NOTE) SARS-CoV-2 target nucleic acids are NOT DETECTED. The SARS-CoV-2 RNA is generally detectable in upper and lower respiratory specimens during the acute phase of infection. Negative results do not preclude SARS-CoV-2 infection, do not rule out co-infections with other pathogens, and should not be used as  the sole basis for treatment or other patient management decisions. Negative results must be combined with clinical observations, patient history, and epidemiological information. The expected result is Negative. Fact Sheet for Patients: SugarRoll.be Fact Sheet for Healthcare Providers: https://www.woods-mathews.com/ This test is not yet approved or cleared by the [place] FDA and  has been authorized for detection and/or diagnosis of SARS-CoV-2 by FDA under an Emergency Use Authorization (EUA). This EUA will remain  in effect (meaning this test can be used) for the duration of the COVID-19 declaration under Section 56 4(b)(1) of the Act, 21 U.S.C. section 360bbb-3(b)(1), unless the authorization is terminated or revoked sooner. Performed at [facility], [place] [address], [place], [place] [address]   MRSA PCR Screening     Status: Abnormal   Collection Time: 03/10/19  9:49 PM   Specimen: Nasal Mucosa; Nasopharyngeal  Result Value Ref Range Status   MRSA by PCR POSITIVE (A) NEGATIVE Final    Comment:        The GeneXpert MRSA Assay (FDA approved for NASAL specimens only), is one component of a comprehensive MRSA colonization surveillance program. It is not intended to diagnose MRSA infection nor to guide or monitor treatment for MRSA infections. RESULT CALLED TO, READ  BACK BY AND VERIFIED WITH: Gypsy Lore RN 03/10/19 2338 JDW Performed at [facility], [place] [address] [place], [place] [address]      Labs: BNP (last 3 results) No results for input(s): BNP in the last 8760 hours. Basic Metabolic Panel: Recent Labs  Lab 03/10/19 1254 03/11/19 0745 03/12/19 0714  NA 133* 137 137  K 7.3* 4.5 4.2  CL 87* 95* 96*  CO2 24 25 22   GLUCOSE 168* 95 187*  BUN 110* 55* 64*  CREATININE 10.87* 7.16* 8.42*  CALCIUM 8.8* 8.5* 8.9  MG 2.5*  --   --   PHOS  --   --  6.2*   Liver Function Tests: Recent Labs  Lab 03/10/19 1254 03/11/19 0745 03/12/19 0714  AST 49* 44*  --   ALT 50* 44  --   ALKPHOS 110 106  --   BILITOT 1.9* 1.2  --   PROT 7.4 6.8  --   ALBUMIN 3.7 3.3* 3.3*   No results for input(s): LIPASE, AMYLASE in the last 168 hours. No results for input(s): AMMONIA in the last 168 hours. CBC: Recent Labs  Lab 03/10/19 1254 03/11/19 0745 03/12/19 0714  WBC 4.9 3.1* 3.3*  NEUTROABS 3.4  --   --   HGB 8.0* 8.0* 8.0*  HCT 26.3* 26.1* 26.4*  MCV 110.0* 107.4* 110.9*  PLT 155 141* 121*   Cardiac Enzymes: No results for input(s): CKTOTAL, CKMB, CKMBINDEX, TROPONINI in the last 168 hours. BNP: Invalid input(s): POCBNP CBG: Recent Labs  Lab 03/11/19 0721 03/11/19 1123 03/11/19 1609 03/11/19 2202 03/12/19 1117  GLUCAP 92 92 98 209* 195*   D-Dimer No results for input(s): DDIMER in the last 72 hours. Hgb A1c No results for input(s): HGBA1C in the last 72 hours. Lipid Profile No results for input(s): CHOL, HDL, LDLCALC, TRIG, CHOLHDL, LDLDIRECT in the last 72 hours. Thyroid function studies No results for input(s): TSH, T4TOTAL, T3FREE, THYROIDAB in the last 72 hours.  Invalid input(s): FREET3 Anemia work up No results for input(s): VITAMINB12, FOLATE, FERRITIN, TIBC, IRON, RETICCTPCT in the last 72 hours. Urinalysis    Component Value Date/Time   COLORURINE YELLOW 02/25/2017 1224   APPEARANCEUR HAZY (A) 02/25/2017 1224  LABSPEC 1.013 02/25/2017 1224   PHURINE 8.0 02/25/2017 1224   GLUCOSEU >=500 (A) 02/25/2017 1224   HGBUR SMALL (A) 02/25/2017 1224   BILIRUBINUR NEGATIVE 02/25/2017 1224   BILIRUBINUR neg 12/13/2014 1127   KETONESUR NEGATIVE 02/25/2017 1224   PROTEINUR >=300 (A) 02/25/2017 1224   UROBILINOGEN 0.2 05/01/2015 1252   NITRITE NEGATIVE 02/25/2017 1224   LEUKOCYTESUR NEGATIVE 02/25/2017 1224   Sepsis Labs Invalid input(s): PROCALCITONIN,  WBC,  LACTICIDVEN Microbiology Recent Results (from the past 240 hour(s))  SARS CORONAVIRUS 2 Nasal Swab Aptima Multi Swab     Status: None   Collection Time: 03/10/19  3:45 PM   Specimen: Aptima Multi Swab; Nasal Swab  Result Value Ref Range Status   SARS Coronavirus 2 NEGATIVE NEGATIVE Final    Comment: (NOTE) SARS-CoV-2 target nucleic acids are NOT DETECTED. The SARS-CoV-2 RNA is generally detectable in upper and lower respiratory specimens during the acute phase of infection. Negative results do not preclude SARS-CoV-2 infection, do not rule out co-infections with other pathogens, and should not be used as the sole basis for treatment or other patient management decisions. Negative results must be combined with clinical observations, patient history, and epidemiological information. The expected result is Negative. Fact Sheet for Patients: SugarRoll.be Fact Sheet for Healthcare Providers: https://www.woods-mathews.com/ This test is not yet approved or cleared by the [place] FDA and  has been authorized for detection and/or diagnosis of SARS-CoV-2 by FDA under an Emergency Use Authorization (EUA). This EUA will remain  in effect (meaning this test can be used) for the duration of the COVID-19 declaration under Section 56 4(b)(1) of the Act, 21 U.S.C. section 360bbb-3(b)(1), unless the authorization is terminated or revoked sooner. Performed at [facility], [place] [address], [place],  [place] [address]   MRSA PCR Screening     Status: Abnormal   Collection Time: 03/10/19  9:49 PM   Specimen: Nasal Mucosa; Nasopharyngeal  Result Value Ref Range Status   MRSA by PCR POSITIVE (A) NEGATIVE Final    Comment:        The GeneXpert MRSA Assay (FDA approved for NASAL specimens only), is one component of a comprehensive MRSA colonization surveillance program. It is not intended to diagnose MRSA infection nor to guide or monitor treatment for MRSA infections. RESULT CALLED TO, READ BACK BY AND VERIFIED WITH: Gypsy Lore RN 03/10/19 2338 JDW Performed at [facility], [place] [address] [place], [place] [address]      Time coordinating discharge: 35 minutes  SIGNED:   Cordelia Poche, MD [facility] 03/12/2019, 2:56 PM

## 2019-03-12 NOTE — Progress Notes (Signed)
DISCHARGE NOTE HOME [name] to be discharged Home per MD order. Discussed prescriptions and follow up appointments with the patient. Prescriptions given to patient; medication list explained in detail. Patient verbalized understanding.  Skin clean, dry and intact without evidence of skin break down, no evidence of skin tears noted. IV catheter discontinued intact. Site without signs and symptoms of complications. Dressing and pressure applied. Pt denies pain at the site currently. No complaints noted.  Patient free of lines, drains, and wounds.   An After Visit Summary (AVS) was printed and given to the patient. Patient escorted via wheelchair, and discharged home via private auto.  Aneta Mins BSN, RN3

## 2019-03-12 NOTE — TOC Transition Note (Signed)
Transition of Care [facility]) - CM/SW Discharge Note   Patient Details  Name: [name] MRN: [id number] Date of Birth: May 16, 1966  Transition of Care [facility]) CM/SW Contact:  Pollie Friar, RN Phone Number: 03/12/2019, 5:56 PM   Clinical Narrative:    Pt discharging home with resumption of her [facility] services. Butch Penny with [facility] aware of resumption orders.  Pt has transportation home.   Final next level of care: [place] Barriers to Discharge: No Barriers Identified   Patient Goals and CMS Choice        Discharge Placement                       Discharge Plan and Services                          HH Arranged: PT [place] Agency: [place] ([facility]) Date HH Agency Contacted: 03/12/19   Representative spoke with at [place]: [place] ([place]) Interventions     Readmission Risk Interventions Readmission Risk Prevention Plan 02/26/2019 01/21/2019 11/17/2018  Transportation Screening Complete Complete -  Medication Review Press photographer) Complete Complete -  PCP or Specialist appointment within 3-5 days of discharge Complete Complete -  [place] or Home Care Consult Complete Complete -  SW Recovery Care/Counseling Consult Complete - Complete  Palliative Care Screening Not Applicable Not Applicable -  [place] Not Applicable Not Applicable -  Some recent data might be hidden

## 2019-03-12 NOTE — Progress Notes (Signed)
Renal Navigator met with patient to evaluate barriers to attending OP HD treatment as non-compliance has been noted and to offer support. Patient was pleasant and welcoming of visit. She reports not feeling well. Renal Navigator provided space for patient to share how she is feeling physically and emotionally. She reports she feels like she has MRSA, which she has had before. Renal Navigator encouraged her to advocate for herself while trusting her medical providers to know how to care for her. Her RN brought her medication while Renal Navigator was with her and Renal Navigator commented on how medical team was addressing her concerns. Renal Navigator spoke with patient about her supports and plan for what to do in an emergency. Patient states limited supports and that she will call 911 if needed because she does not want to be a burden to her family. She admits that none of her family has ever called her a burden, but that this is just the way she feels. She did not seem to want to explore this. Renal Navigator spoke to her about caring for herself and whether there are barriers to going to OP HD treatment. She states she doesn't go because she doesn't feel good. Throughout the conversation, she told Renal Navigator that she usually feels better after HD, which Renal Navigator brought up to her. Renal Navigator asked her if she feels worse when she does not go to HD and asked if she usually ends up in the hospital if she misses too many treatments. She agreed. Renal Navigator ensured that she had transportation. Patient does not identify any other barriers.  Finally, Renal Navigator asked patient what she does to relax. She replied, "feel safe and secure." Renal Navigator acknowledged that it is hard to feel safe and secure when you may feel out of control physically/physically unwell and asked her "what makes you feel safe and secure?" Patient replied, "reading the Bible." Renal Navigator commended patient for  being able to identify something concrete that she can do any time she is feeling unwell and out of control in order to help her feel safe and secure. Renal Navigator asked her to remember her response today and use this coping tool. Renal Navigator asked patient to make a commitment to herself to try to make it to OP HD so she will feel better. Renal Navigator did not get much response, but patient seemed very appreciative of the time spent with her today.  Alphonzo Cruise, [place] Renal Navigator [phone]

## 2019-03-22 ENCOUNTER — Ambulatory Visit: Payer: Medicare Other | Admitting: Physician Assistant

## 2019-03-31 ENCOUNTER — Ambulatory Visit: Payer: Medicare Other | Admitting: Physician Assistant

## 2019-04-01 ENCOUNTER — Emergency Department (HOSPITAL_COMMUNITY): Payer: Medicare Other

## 2019-04-01 ENCOUNTER — Encounter (HOSPITAL_COMMUNITY): Payer: Self-pay | Admitting: *Deleted

## 2019-04-01 ENCOUNTER — Inpatient Hospital Stay (HOSPITAL_COMMUNITY)
Admission: EM | Admit: 2019-04-01 | Discharge: 2019-04-03 | DRG: 377 | Disposition: A | Discharge status: Home Health | Payer: Medicare Other | Attending: Family Medicine | Admitting: Family Medicine

## 2019-04-01 ENCOUNTER — Other Ambulatory Visit: Payer: Self-pay

## 2019-04-01 DIAGNOSIS — Z8249 Family history of ischemic heart disease and other diseases of the circulatory system: Secondary | ICD-10-CM

## 2019-04-01 DIAGNOSIS — Z794 Long term (current) use of insulin: Secondary | ICD-10-CM

## 2019-04-01 DIAGNOSIS — F319 Bipolar disorder, unspecified: Secondary | ICD-10-CM | POA: Diagnosis present

## 2019-04-01 DIAGNOSIS — Z91040 Latex allergy status: Secondary | ICD-10-CM

## 2019-04-01 DIAGNOSIS — K219 Gastro-esophageal reflux disease without esophagitis: Secondary | ICD-10-CM | POA: Diagnosis present

## 2019-04-01 DIAGNOSIS — F419 Anxiety disorder, unspecified: Secondary | ICD-10-CM | POA: Diagnosis present

## 2019-04-01 DIAGNOSIS — E785 Hyperlipidemia, unspecified: Secondary | ICD-10-CM | POA: Diagnosis present

## 2019-04-01 DIAGNOSIS — K921 Melena: Secondary | ICD-10-CM | POA: Diagnosis not present

## 2019-04-01 DIAGNOSIS — Z880 Allergy status to penicillin: Secondary | ICD-10-CM

## 2019-04-01 DIAGNOSIS — E1122 Type 2 diabetes mellitus with diabetic chronic kidney disease: Secondary | ICD-10-CM | POA: Diagnosis present

## 2019-04-01 DIAGNOSIS — B182 Chronic viral hepatitis C: Secondary | ICD-10-CM

## 2019-04-01 DIAGNOSIS — Z79899 Other long term (current) drug therapy: Secondary | ICD-10-CM

## 2019-04-01 DIAGNOSIS — E8779 Other fluid overload: Secondary | ICD-10-CM | POA: Diagnosis present

## 2019-04-01 DIAGNOSIS — E1140 Type 2 diabetes mellitus with diabetic neuropathy, unspecified: Secondary | ICD-10-CM | POA: Diagnosis present

## 2019-04-01 DIAGNOSIS — R21 Rash and other nonspecific skin eruption: Secondary | ICD-10-CM | POA: Diagnosis present

## 2019-04-01 DIAGNOSIS — E1169 Type 2 diabetes mellitus with other specified complication: Secondary | ICD-10-CM | POA: Diagnosis present

## 2019-04-01 DIAGNOSIS — Z992 Dependence on renal dialysis: Secondary | ICD-10-CM

## 2019-04-01 DIAGNOSIS — F1420 Cocaine dependence, uncomplicated: Secondary | ICD-10-CM | POA: Diagnosis present

## 2019-04-01 DIAGNOSIS — I5042 Chronic combined systolic (congestive) and diastolic (congestive) heart failure: Secondary | ICD-10-CM | POA: Diagnosis present

## 2019-04-01 DIAGNOSIS — Z9115 Patient's noncompliance with renal dialysis: Secondary | ICD-10-CM | POA: Diagnosis not present

## 2019-04-01 DIAGNOSIS — D62 Acute posthemorrhagic anemia: Secondary | ICD-10-CM | POA: Diagnosis present

## 2019-04-01 DIAGNOSIS — K5731 Diverticulosis of large intestine without perforation or abscess with bleeding: Secondary | ICD-10-CM | POA: Diagnosis present

## 2019-04-01 DIAGNOSIS — I10 Essential (primary) hypertension: Secondary | ICD-10-CM | POA: Diagnosis present

## 2019-04-01 DIAGNOSIS — G894 Chronic pain syndrome: Secondary | ICD-10-CM | POA: Diagnosis present

## 2019-04-01 DIAGNOSIS — K625 Hemorrhage of anus and rectum: Secondary | ICD-10-CM

## 2019-04-01 DIAGNOSIS — Z90710 Acquired absence of both cervix and uterus: Secondary | ICD-10-CM

## 2019-04-01 DIAGNOSIS — H409 Unspecified glaucoma: Secondary | ICD-10-CM | POA: Diagnosis present

## 2019-04-01 DIAGNOSIS — Z89432 Acquired absence of left foot: Secondary | ICD-10-CM

## 2019-04-01 DIAGNOSIS — Z888 Allergy status to other drugs, medicaments and biological substances status: Secondary | ICD-10-CM

## 2019-04-01 DIAGNOSIS — E7800 Pure hypercholesterolemia, unspecified: Secondary | ICD-10-CM | POA: Diagnosis present

## 2019-04-01 DIAGNOSIS — F1421 Cocaine dependence, in remission: Secondary | ICD-10-CM | POA: Diagnosis present

## 2019-04-01 DIAGNOSIS — I132 Hypertensive heart and chronic kidney disease with heart failure and with stage 5 chronic kidney disease, or end stage renal disease: Secondary | ICD-10-CM | POA: Diagnosis present

## 2019-04-01 DIAGNOSIS — N186 End stage renal disease: Secondary | ICD-10-CM | POA: Diagnosis present

## 2019-04-01 DIAGNOSIS — Z833 Family history of diabetes mellitus: Secondary | ICD-10-CM

## 2019-04-01 DIAGNOSIS — Z8719 Personal history of other diseases of the digestive system: Secondary | ICD-10-CM

## 2019-04-01 DIAGNOSIS — D649 Anemia, unspecified: Secondary | ICD-10-CM | POA: Diagnosis present

## 2019-04-01 DIAGNOSIS — Z8669 Personal history of other diseases of the nervous system and sense organs: Secondary | ICD-10-CM

## 2019-04-01 DIAGNOSIS — E875 Hyperkalemia: Secondary | ICD-10-CM

## 2019-04-01 DIAGNOSIS — Z20828 Contact with and (suspected) exposure to other viral communicable diseases: Secondary | ICD-10-CM | POA: Diagnosis present

## 2019-04-01 DIAGNOSIS — K922 Gastrointestinal hemorrhage, unspecified: Secondary | ICD-10-CM

## 2019-04-01 DIAGNOSIS — F17210 Nicotine dependence, cigarettes, uncomplicated: Secondary | ICD-10-CM | POA: Diagnosis present

## 2019-04-01 DIAGNOSIS — E119 Type 2 diabetes mellitus without complications: Secondary | ICD-10-CM

## 2019-04-01 DIAGNOSIS — R072 Precordial pain: Secondary | ICD-10-CM

## 2019-04-01 DIAGNOSIS — Z8673 Personal history of transient ischemic attack (TIA), and cerebral infarction without residual deficits: Secondary | ICD-10-CM

## 2019-04-01 DIAGNOSIS — N2581 Secondary hyperparathyroidism of renal origin: Secondary | ICD-10-CM | POA: Diagnosis present

## 2019-04-01 DIAGNOSIS — Z8614 Personal history of Methicillin resistant Staphylococcus aureus infection: Secondary | ICD-10-CM

## 2019-04-01 LAB — COMPREHENSIVE METABOLIC PANEL
ALT: 47 U/L — ABNORMAL HIGH (ref 0–44)
AST: 49 U/L — ABNORMAL HIGH (ref 15–41)
Albumin: 3.3 g/dL — ABNORMAL LOW (ref 3.5–5.0)
Alkaline Phosphatase: 106 U/L (ref 38–126)
Anion gap: 21 — ABNORMAL HIGH (ref 5–15)
BUN: 111 mg/dL — ABNORMAL HIGH (ref 6–20)
CO2: 22 mmol/L (ref 22–32)
Calcium: 8.9 mg/dL (ref 8.9–10.3)
Chloride: 93 mmol/L — ABNORMAL LOW (ref 98–111)
Creatinine, Ser: 7.74 mg/dL — ABNORMAL HIGH (ref 0.44–1.00)
GFR calc Af Amer: 6 mL/min — ABNORMAL LOW (ref >60)
GFR calc non Af Amer: 5 mL/min — ABNORMAL LOW (ref >60)
Glucose, Bld: 303 mg/dL — ABNORMAL HIGH (ref 70–99)
Potassium: 6 mmol/L — ABNORMAL HIGH (ref 3.5–5.1)
Sodium: 136 mmol/L (ref 135–145)
Total Bilirubin: 0.8 mg/dL (ref 0.3–1.2)
Total Protein: 6.4 g/dL — ABNORMAL LOW (ref 6.5–8.1)

## 2019-04-01 LAB — FOLATE: Folate: 57.9 ng/mL (ref >5.9)

## 2019-04-01 LAB — TROPONIN I (HIGH SENSITIVITY)
Troponin I (High Sensitivity): 36 ng/L — ABNORMAL HIGH (ref <18)
Troponin I (High Sensitivity): 40 ng/L — ABNORMAL HIGH (ref <18)

## 2019-04-01 LAB — CBC
HCT: 15.5 % — ABNORMAL LOW (ref 36.0–46.0)
Hemoglobin: 4.8 g/dL — CL (ref 12.0–15.0)
MCH: 33.6 pg (ref 26.0–34.0)
MCHC: 31 g/dL (ref 30.0–36.0)
MCV: 108.4 fL — ABNORMAL HIGH (ref 80.0–100.0)
Platelets: 102 10*3/uL — ABNORMAL LOW (ref 150–400)
RBC: 1.43 MIL/uL — ABNORMAL LOW (ref 3.87–5.11)
RDW: 16.7 % — ABNORMAL HIGH (ref 11.5–15.5)
WBC: 3.4 10*3/uL — ABNORMAL LOW (ref 4.0–10.5)
nRBC: 0 % (ref 0.0–0.2)

## 2019-04-01 LAB — RETICULOCYTES
Immature Retic Fract: 26.9 % — ABNORMAL HIGH (ref 2.3–15.9)
RBC.: 2.46 MIL/uL — ABNORMAL LOW (ref 3.87–5.11)
Retic Count, Absolute: 88.6 10*3/uL (ref 19.0–186.0)
Retic Ct Pct: 3.6 % — ABNORMAL HIGH (ref 0.4–3.1)

## 2019-04-01 LAB — IRON AND TIBC
Iron: 95 ug/dL (ref 28–170)
Saturation Ratios: 29 % (ref 10.4–31.8)
TIBC: 326 ug/dL (ref 250–450)
UIBC: 231 ug/dL

## 2019-04-01 LAB — VITAMIN B12: Vitamin B-12: 6897 pg/mL — ABNORMAL HIGH (ref 180–914)

## 2019-04-01 LAB — POC OCCULT BLOOD, ED: Fecal Occult Bld: POSITIVE — AB

## 2019-04-01 LAB — FERRITIN: Ferritin: 804 ng/mL — ABNORMAL HIGH (ref 11–307)

## 2019-04-01 LAB — PREPARE RBC (CROSSMATCH)

## 2019-04-01 LAB — SARS CORONAVIRUS 2 BY RT PCR (HOSPITAL ORDER, PERFORMED IN ~~LOC~~ HOSPITAL LAB): SARS Coronavirus 2: NEGATIVE

## 2019-04-01 MED ORDER — DULOXETINE HCL 60 MG PO CPEP
60.0000 mg | ORAL_CAPSULE | Freq: Every day | ORAL | Status: DC
  Administered 2019-04-02 – 2019-04-03 (×2): 60 mg via ORAL
  Filled 2019-04-01 (×2): qty 1

## 2019-04-01 MED ORDER — ATORVASTATIN CALCIUM 40 MG PO TABS
40.0000 mg | ORAL_TABLET | Freq: Every day | ORAL | Status: DC
  Administered 2019-04-01 – 2019-04-02 (×2): 40 mg via ORAL
  Filled 2019-04-01 (×2): qty 1

## 2019-04-01 MED ORDER — CALCITRIOL 0.5 MCG PO CAPS
1.0000 ug | ORAL_CAPSULE | ORAL | Status: DC
  Administered 2019-04-03 (×2): 1 ug via ORAL
  Filled 2019-04-01: qty 2

## 2019-04-01 MED ORDER — SODIUM CHLORIDE 0.9% FLUSH
3.0000 mL | Freq: Two times a day (BID) | INTRAVENOUS | Status: DC
  Administered 2019-04-01 – 2019-04-03 (×4): 3 mL via INTRAVENOUS

## 2019-04-01 MED ORDER — HYDROXYZINE HCL 25 MG PO TABS: 25.0000 mg | ORAL_TABLET | Freq: Three times a day (TID) | ORAL | Status: DC | PRN

## 2019-04-01 MED ORDER — DARBEPOETIN ALFA 200 MCG/0.4ML IJ SOSY
200.0000 ug | PREFILLED_SYRINGE | INTRAMUSCULAR | Status: DC
  Administered 2019-04-01: 17:00:00 200 ug via INTRAVENOUS

## 2019-04-01 MED ORDER — CHLORHEXIDINE GLUCONATE CLOTH 2 % EX PADS: 6.0000 | MEDICATED_PAD | Freq: Every day | CUTANEOUS | Status: DC

## 2019-04-01 MED ORDER — ONDANSETRON HCL 4 MG/2ML IJ SOLN: 4.0000 mg | Freq: Four times a day (QID) | INTRAMUSCULAR | Status: DC | PRN

## 2019-04-01 MED ORDER — ACETAMINOPHEN 325 MG PO TABS
650.0000 mg | ORAL_TABLET | Freq: Four times a day (QID) | ORAL | Status: DC | PRN
  Administered 2019-04-02 – 2019-04-03 (×2): 650 mg via ORAL
  Filled 2019-04-01: qty 2

## 2019-04-01 MED ORDER — SENNOSIDES-DOCUSATE SODIUM 8.6-50 MG PO TABS
2.0000 | ORAL_TABLET | Freq: Every day | ORAL | Status: DC
  Administered 2019-04-01: 2 via ORAL
  Filled 2019-04-01 (×2): qty 2

## 2019-04-01 MED ORDER — ALLOPURINOL 100 MG PO TABS
200.0000 mg | ORAL_TABLET | Freq: Every day | ORAL | Status: DC
  Administered 2019-04-02 – 2019-04-03 (×2): 200 mg via ORAL
  Filled 2019-04-01 (×2): qty 2

## 2019-04-01 MED ORDER — TRAZODONE HCL 50 MG PO TABS
25.0000 mg | ORAL_TABLET | Freq: Every day | ORAL | Status: DC
  Administered 2019-04-01 – 2019-04-02 (×2): 25 mg via ORAL
  Filled 2019-04-01 (×2): qty 1

## 2019-04-01 MED ORDER — OXYCODONE HCL 5 MG PO TABS
5.0000 mg | ORAL_TABLET | ORAL | Status: DC | PRN
  Administered 2019-04-01 – 2019-04-03 (×4): 10 mg via ORAL
  Filled 2019-04-01 (×3): qty 2

## 2019-04-01 MED ORDER — DARBEPOETIN ALFA 200 MCG/0.4ML IJ SOSY
PREFILLED_SYRINGE | INTRAMUSCULAR | Status: AC
  Administered 2019-04-01: 200 ug via INTRAVENOUS
  Filled 2019-04-01: qty 0.4

## 2019-04-01 MED ORDER — DOCUSATE SODIUM 283 MG RE ENEM
1.0000 | ENEMA | RECTAL | Status: DC | PRN
  Filled 2019-04-01: qty 1

## 2019-04-01 MED ORDER — BENZONATATE 100 MG PO CAPS: 200.0000 mg | ORAL_CAPSULE | Freq: Two times a day (BID) | ORAL | Status: DC | PRN

## 2019-04-01 MED ORDER — HYDROCORTISONE (PERIANAL) 2.5 % EX CREA
TOPICAL_CREAM | Freq: Two times a day (BID) | CUTANEOUS | Status: DC
  Administered 2019-04-03: 11:00:00 via RECTAL
  Filled 2019-04-01: qty 28.35

## 2019-04-01 MED ORDER — SORBITOL 70 % SOLN: 30.0000 mL | Status: DC | PRN

## 2019-04-01 MED ORDER — CALCIUM CARBONATE ANTACID 1250 MG/5ML PO SUSP
500.0000 mg | Freq: Four times a day (QID) | ORAL | Status: DC | PRN
  Filled 2019-04-01: qty 5

## 2019-04-01 MED ORDER — ACETAMINOPHEN 650 MG RE SUPP
650.0000 mg | Freq: Four times a day (QID) | RECTAL | Status: DC | PRN
  Filled 2019-04-01: qty 1

## 2019-04-01 MED ORDER — SODIUM CHLORIDE 0.9% IV SOLUTION: Freq: Once | INTRAVENOUS | Status: DC

## 2019-04-01 MED ORDER — PANTOPRAZOLE SODIUM 40 MG IV SOLR
40.0000 mg | Freq: Once | INTRAVENOUS | Status: AC
  Administered 2019-04-03: 40 mg via INTRAVENOUS
  Filled 2019-04-01: qty 40

## 2019-04-01 MED ORDER — ONDANSETRON HCL 4 MG PO TABS: 4.0000 mg | ORAL_TABLET | Freq: Four times a day (QID) | ORAL | Status: DC | PRN

## 2019-04-01 MED ORDER — SODIUM CHLORIDE 0.9 % IV SOLN: 10.0000 mL/h | Freq: Once | INTRAVENOUS | Status: DC

## 2019-04-01 MED ORDER — HYDROCORTISONE 2.5 % RE CREA: 1.0000 "application " | TOPICAL_CREAM | Freq: Two times a day (BID) | RECTAL | Status: DC

## 2019-04-01 MED ORDER — LABETALOL HCL 200 MG PO TABS: 200.0000 mg | ORAL_TABLET | Freq: Two times a day (BID) | ORAL | Status: DC

## 2019-04-01 MED ORDER — NEPRO/CARBSTEADY PO LIQD: 237.0000 mL | Freq: Three times a day (TID) | ORAL | Status: DC | PRN

## 2019-04-01 MED ORDER — CAMPHOR-MENTHOL 0.5-0.5 % EX LOTN
1.0000 "application " | TOPICAL_LOTION | Freq: Three times a day (TID) | CUTANEOUS | Status: DC | PRN
  Filled 2019-04-01 (×2): qty 222

## 2019-04-01 MED ORDER — ZOLPIDEM TARTRATE 5 MG PO TABS: 5.0000 mg | ORAL_TABLET | Freq: Every evening | ORAL | Status: DC | PRN

## 2019-04-01 MED ORDER — ALBUTEROL SULFATE (2.5 MG/3ML) 0.083% IN NEBU: 2.5000 mg | INHALATION_SOLUTION | RESPIRATORY_TRACT | Status: DC | PRN

## 2019-04-01 NOTE — Consult Note (Addendum)
[facility] Renal Consultation Note    Indication for Consultation:  Management of ESRD/hemodialysis, anemia, hypertension/volume, and secondary hyperparathyroidism. PCP:  HPI: [name] is a 53 y.o. female with ESRD, HFrEF (EF 40%), HTN, Type 2 DM, Hx bipolar, Hx TIA, Hx seizure, Hep C, and recurrent GIB is being admitted with anemia.  Presented to ED after feeling pre-syncopal while getting ready for dermatology appt this morning. Has noticed SOB, weakness, and dark stools for the past week. No sharp abdominal pains, nausea, or vomiting. No fever or chills.  In ED, she was not hypotensive or hypoxic. Intake labs showed K 6, Ca 8.9, Alb 3.3, WBC 3.4, Hgb 4.8, Plts 102. CXR clear. Offered blood transfusion which she initially refused ( Jehovah's witness although she has agreed for blood in past) - now agreeable for 1 unit.  Dialyzes on TTS sched at [facility] clinic. Her last HD was 8/24 (came Monday d/t appt Tues) - was supposed to be there today, missed d/t being here. She is historically non-compliance with her treatments and often is noted to have hyperkalemia on her labs. Of note, outpatient labs on 8/22 (pre-dialysis) showed K 7.3, Hgb 8.1, tsat 34%).  Past Medical History:  Diagnosis Date  . Anemia   . Anxiety   . Bipolar disorder, unspecified ([place]) 02/06/2014  . Chronic combined systolic and diastolic CHF (congestive heart failure) (HCC)    EF 40% by echo and 48% by stress test  . Chronic pain syndrome   . Depression   . ESRD on hemodialysis (HCC)    TTS  . GERD (gastroesophageal reflux disease)   . Glaucoma   . Headache    sinus headaches  . Hepatitis C   . High cholesterol   . History of hiatal hernia   . History of vitamin D deficiency 06/20/2015  . Hypertension   . MRSA infection ?2014   "on the back of my head; spread throughout my bloodstream"  . Neuropathy   . Refusal of blood transfusions as patient is Jehovah's Witness   . Refusal of blood  transfusions as patient is Jehovah's Witness   . Schizoaffective disorder, unspecified condition 02/08/2014  . Seizure [facility]) dx'd 2014   "don't know what kind; last one was ~ 04/2014"  . TIA (transient ischemic attack) 2010  . Type II diabetes mellitus ([place])    INSULIN DEPENDENT   Past Surgical History:  Procedure Laterality Date  . ABDOMINAL HYSTERECTOMY  2014  . ABDOMINAL HYSTERECTOMY  2010  . AMPUTATION Left 05/21/2013   Procedure: Left Midfoot AMPUTATION;  Surgeon: Newt Minion, MD;  Location: [place];  Service: Orthopedics;  Laterality: Left;  Left Midfoot Amputation  . AV FISTULA PLACEMENT Left 05/04/2015   Procedure: ARTERIOVENOUS (AV) GRAFT INSERTION;  Surgeon: Angelia Mould, MD;  Location: [place];  Service: Vascular;  Laterality: Left;  . BIOPSY  11/18/2018   Procedure: BIOPSY;  Surgeon: Jerene Bears, MD;  Location: [facility] ENDOSCOPY;  Service: Gastroenterology;;  . COLONOSCOPY WITH PROPOFOL N/A 06/22/2013   Procedure: COLONOSCOPY WITH PROPOFOL;  Surgeon: Jeryl Columbia, MD;  Location: [facility] ENDOSCOPY;  Service: Endoscopy;  Laterality: N/A;  . COLONOSCOPY WITH PROPOFOL N/A 11/18/2018   Procedure: COLONOSCOPY WITH PROPOFOL;  Surgeon: Jerene Bears, MD;  Location: [place];  Service: Gastroenterology;  Laterality: N/A;  . ENTEROSCOPY N/A 12/31/2018   Procedure: ENTEROSCOPY;  Surgeon: Irene Shipper, MD;  Location: [facility] ENDOSCOPY;  Service: Endoscopy;  Laterality: N/A;  Push enteroscopy at 8am (spoke  with Endo)  . ESOPHAGOGASTRODUODENOSCOPY N/A 05/11/2015   Procedure: ESOPHAGOGASTRODUODENOSCOPY (EGD);  Surgeon: Laurence Spates, MD;  Location: [facility] ENDOSCOPY;  Service: Endoscopy;  Laterality: N/A;  . ESOPHAGOGASTRODUODENOSCOPY (EGD) WITH PROPOFOL N/A 11/18/2018   Procedure: ESOPHAGOGASTRODUODENOSCOPY (EGD) WITH PROPOFOL;  Surgeon: Jerene Bears, MD;  Location: [facility] ENDOSCOPY;  Service: Gastroenterology;  Laterality: N/A;  . EYE SURGERY Bilateral 2010   Lasik  . FLEXIBLE SIGMOIDOSCOPY N/A 01/17/2017    Procedure: FLEXIBLE SIGMOIDOSCOPY w/ hemorrhoid banding possible;  Surgeon: Gatha Mayer, MD;  Location: [facility] ENDOSCOPY;  Service: Endoscopy;  Laterality: N/A;  . FLEXIBLE SIGMOIDOSCOPY N/A 01/21/2019   Procedure: FLEXIBLE SIGMOIDOSCOPY;  Surgeon: Ronald Lobo, MD;  Location: [facility] ENDOSCOPY;  Service: Endoscopy;  Laterality: N/A;  Plan to do the test unprepped, unsedated  . FOOT AMPUTATION Left    due diabetic neuropathy, could not feel ulcer on bottom of foot  . GIVENS CAPSULE STUDY N/A 01/21/2019   Procedure: GIVENS CAPSULE STUDY;  Surgeon: Ronald Lobo, MD;  Location: [facility] ENDOSCOPY;  Service: Endoscopy;  Laterality: N/A;  . POLYPECTOMY  11/18/2018   Procedure: POLYPECTOMY;  Surgeon: Jerene Bears, MD;  Location: [facility] ENDOSCOPY;  Service: Gastroenterology;;  . REFRACTIVE SURGERY Bilateral 2013  . TONSILLECTOMY  1970's  . TUBAL LIGATION  2000   Family History  Problem Relation Age of Onset  . Hypertension Mother   . Diabetes Mother   . Hypertension Father   . Diabetes Father    Social History:  reports that she has been smoking cigarettes. She has a 36.00 pack-year smoking history. She has never used smokeless tobacco. She reports current alcohol use of about 3.0 standard drinks of alcohol per week. She reports previous drug use. Drugs: "Crack" cocaine and Cocaine.  ROS: As per HPI otherwise negative.  Physical Exam: Vitals:   04/01/19 1126 04/01/19 1127 04/01/19 1128  BP:  111/61 111/61  Pulse:  70 71  Resp:  13 18  Temp:  (!) 97.5 F (36.4 C) (!) 97.5 F (36.4 C)  TempSrc:  Oral Oral  SpO2: 98%  98%  Weight:   69.9 kg  Height:   5\' 4"  (1.626 m)     General: Chronically ill appearing woman, NAD. Laying flat on back with nasal oxygen. Head: Normocephalic, atraumatic, sclera non-icteric, mucus membranes are moist. Skin: Scattered picked lesions on B legs, arms, and scalp. Neck: Supple without lymphadenopathy/masses. JVD not elevated. Lungs: Clear bilaterally to  auscultation without wheezes, rales, or rhonchi.  Heart: RRR, 2/6 systolic murmur Abdomen: Soft, slightly distended. No tenderness or guarding. Musculoskeletal:  Strength and tone appear normal for age. Lower extremities: Trace BLE edema in ankles; scattered open wounds to B legs. Neuro: Alert and oriented X 3. Moves all extremities spontaneously. Psych:  Responds to questions appropriately with a normal affect. Dialysis Access: L forearm AVG + bruit  Allergies  Allergen Reactions  . Penicillins Hives and Other (See Comments)    Has patient had a PCN reaction causing immediate rash, facial/tongue/throat swelling, SOB or lightheadedness with hypotension: Yes Has patient had a PCN reaction causing severe rash involving mucus membranes or skin necrosis: No Has patient had a PCN reaction that required hospitalization No Has patient had a PCN reaction occurring within the last 10 years: No If all of the above answers are "NO", then may proceed with Cephalosporin use. Pt tolerates cefepime and ceftriaxone  . Gabapentin Itching and Swelling  . Lyrica [Pregabalin] Swelling and Other (See Comments)    Facial and leg swelling  .  Saphris [Asenapine] Swelling and Other (See Comments)    Facial swelling  . Tramadol Swelling and Other (See Comments)    Leg swelling  . Adhesive [Tape] Itching    SEVERE ITCHING  . Latex Itching    SEVERE ITCHING  . Other Other (See Comments)    NO BLOOD PRODUCTS; PATIENT IS A JEHOVAH'S WITNESS   Prior to Admission medications   Medication Sig Start Date End Date Taking? Authorizing Provider  acetaminophen (TYLENOL) 325 MG tablet Take 2 tablets (650 mg total) by mouth every 6 (six) hours as needed for fever, headache or moderate pain. 02/26/19   Roxan Hockey, MD  albuterol (VENTOLIN HFA) 108 (90 Base) MCG/ACT inhaler Inhale 2 puffs into the lungs every 4 (four) hours as needed for wheezing or shortness of breath. 01/26/19   Dahal, Marlowe Aschoff, MD  allopurinol  (ZYLOPRIM) 100 MG tablet Take 2 tablets (200 mg total) by mouth daily. 02/26/19 03/28/19  Roxan Hockey, MD  atorvastatin (LIPITOR) 40 MG tablet Take 1 tablet (40 mg total) by mouth at bedtime. 02/26/19   Roxan Hockey, MD  benzonatate (TESSALON) 200 MG capsule Take 1 capsule (200 mg total) by mouth 2 (two) times daily as needed for cough. 03/12/19   Mariel Aloe, MD  calcitRIOL (ROCALTROL) 0.25 MCG capsule Take 4 capsules (1 mcg total) by mouth every dialysis (3 times per week on TTS). 02/26/19   Roxan Hockey, MD  Darbepoetin Alfa (ARANESP) 200 MCG/0.4ML SOSY injection Inject 0.4 mLs (200 mcg total) into the vein every Wednesday with hemodialysis. 01/27/19   Terrilee Croak, MD  diphenhydrAMINE (BENADRYL) 25 mg capsule Take 1 capsule (25 mg total) by mouth every 6 (six) hours as needed for up to 8 days for itching. Patient not taking: Reported on 02/25/2019 01/26/19 02/25/28  Terrilee Croak, MD  DULoxetine (CYMBALTA) 30 MG capsule Take 2 capsules (60 mg total) by mouth daily. 03/12/19   Mariel Aloe, MD  hydrocortisone (ANUSOL-HC) 2.5 % rectal cream Place rectally 2 (two) times daily. Patient taking differently: Place 1 application rectally 2 (two) times daily.  01/26/19   Terrilee Croak, MD  hydrOXYzine (ATARAX/VISTARIL) 25 MG tablet Take 1 tablet (25 mg total) by mouth 3 (three) times daily as needed for itching or anxiety. 02/26/19   Roxan Hockey, MD  insulin aspart (NOVOLOG) 100 UNIT/ML injection 0-9 Units, Subcutaneous, 3 times daily with meals CBG < 70: implement hypoglycemia protocol CBG 70 - 120: 0 units CBG 121 - 150: 1 unit CBG 151 - 200: 2 units CBG 201 - 250: 3 units CBG 251 - 300: 5 units CBG 301 - 350: 7 units CBG 351 - 400: 9 units CBG > 400: call MD 01/26/19   Terrilee Croak, MD  labetalol (NORMODYNE) 200 MG tablet Take 1 tablet (200 mg total) by mouth 2 (two) times daily. 02/26/19 03/28/19  Roxan Hockey, MD  lidocaine-prilocaine (EMLA) cream Apply 1 application topically See admin  instructions. On dialysis days 12/23/18   [provider]  mupirocin ointment (BACTROBAN) 2 % Apply 1 application topically as needed (on arm).  12/01/18   [provider]  oxymetazoline (AFRIN) 0.05 % nasal spray Place 2 sprays into both nostrils 2 (two) times daily. 01/26/19   Terrilee Croak, MD  pantoprazole (PROTONIX) 40 MG tablet Take 1 tablet (40 mg total) by mouth daily at 6 (six) AM for 30 days. 01/26/19 03/12/19  Terrilee Croak, MD  senna-docusate (SENOKOT-S) 8.6-50 MG tablet Take 2 tablets by mouth at bedtime. 02/26/19 02/26/20  Roxan Hockey, MD  traZODone (DESYREL) 50 MG tablet Take 0.5 tablets (25 mg total) by mouth at bedtime. 02/26/19 03/28/19  Roxan Hockey, MD   Current Facility-Administered Medications  Medication Dose Route Frequency Provider Last Rate Last Dose  . 0.9 %  sodium chloride infusion (Manually program via Guardrails IV Fluids)   Intravenous Once Loren Racer, PA-C      . 0.9 %  sodium chloride infusion  10 mL/hr Intravenous Once Lajean Saver, MD      . Derrill Memo ON 04/02/2019] Chlorhexidine Gluconate Cloth 2 % PADS 6 each  6 each Topical Q0600 Loren Racer, PA-C      . Darbepoetin Alfa (ARANESP) injection 200 mcg  200 mcg Intravenous Q Thu-HD Loren Racer, PA-C       Current Outpatient Medications  Medication Sig Dispense Refill  . acetaminophen (TYLENOL) 325 MG tablet Take 2 tablets (650 mg total) by mouth every 6 (six) hours as needed for fever, headache or moderate pain. 12 tablet 0  . albuterol (VENTOLIN HFA) 108 (90 Base) MCG/ACT inhaler Inhale 2 puffs into the lungs every 4 (four) hours as needed for wheezing or shortness of breath. 6.7 g 0  . allopurinol (ZYLOPRIM) 100 MG tablet Take 2 tablets (200 mg total) by mouth daily. 60 tablet 3  . atorvastatin (LIPITOR) 40 MG tablet Take 1 tablet (40 mg total) by mouth at bedtime. 30 tablet 3  . benzonatate (TESSALON) 200 MG capsule Take 1 capsule (200 mg total) by mouth 2 (two) times daily  as needed for cough. 20 capsule 0  . calcitRIOL (ROCALTROL) 0.25 MCG capsule Take 4 capsules (1 mcg total) by mouth every dialysis (3 times per week on TTS).    . Darbepoetin Alfa (ARANESP) 200 MCG/0.4ML SOSY injection Inject 0.4 mLs (200 mcg total) into the vein every Wednesday with hemodialysis. 1.68 mL   . diphenhydrAMINE (BENADRYL) 25 mg capsule Take 1 capsule (25 mg total) by mouth every 6 (six) hours as needed for up to 8 days for itching. (Patient not taking: Reported on 02/25/2019) 30 capsule 0  . DULoxetine (CYMBALTA) 30 MG capsule Take 2 capsules (60 mg total) by mouth daily.    . hydrocortisone (ANUSOL-HC) 2.5 % rectal cream Place rectally 2 (two) times daily. (Patient taking differently: Place 1 application rectally 2 (two) times daily. ) 30 g 0  . hydrOXYzine (ATARAX/VISTARIL) 25 MG tablet Take 1 tablet (25 mg total) by mouth 3 (three) times daily as needed for itching or anxiety. 30 tablet 0  . insulin aspart (NOVOLOG) 100 UNIT/ML injection 0-9 Units, Subcutaneous, 3 times daily with meals CBG < 70: implement hypoglycemia protocol CBG 70 - 120: 0 units CBG 121 - 150: 1 unit CBG 151 - 200: 2 units CBG 201 - 250: 3 units CBG 251 - 300: 5 units CBG 301 - 350: 7 units CBG 351 - 400: 9 units CBG > 400: call MD 10 mL 11  . labetalol (NORMODYNE) 200 MG tablet Take 1 tablet (200 mg total) by mouth 2 (two) times daily. 60 tablet 5  . lidocaine-prilocaine (EMLA) cream Apply 1 application topically See admin instructions. On dialysis days    . mupirocin ointment (BACTROBAN) 2 % Apply 1 application topically as needed (on arm).     Marland Kitchen oxymetazoline (AFRIN) 0.05 % nasal spray Place 2 sprays into both nostrils 2 (two) times daily. 20 mL 0  . pantoprazole (PROTONIX) 40 MG tablet Take 1 tablet (40 mg total) by mouth daily at  6 (six) AM for 30 days. 30 tablet 0  . senna-docusate (SENOKOT-S) 8.6-50 MG tablet Take 2 tablets by mouth at bedtime. 60 tablet 1  . traZODone (DESYREL) 50 MG tablet Take 0.5 tablets  (25 mg total) by mouth at bedtime. 15 tablet 0   Labs: Basic Metabolic Panel: Recent Labs  Lab 04/01/19 1145  NA 136  K 6.0*  CL 93*  CO2 22  GLUCOSE 303*  BUN 111*  CREATININE 7.74*  CALCIUM 8.9   Liver Function Tests: Recent Labs  Lab 04/01/19 1145  AST 49*  ALT 47*  ALKPHOS 106  BILITOT 0.8  PROT 6.4*  ALBUMIN 3.3*   CBC: Recent Labs  Lab 04/01/19 1145  WBC 3.4*  HGB 4.8*  HCT 15.5*  MCV 108.4*  PLT 102*   Studies/Results: Dg Chest Port 1 View  Result Date: 04/01/2019 CLINICAL DATA:  Chest pain, shortness of breath. EXAM: PORTABLE CHEST 1 VIEW COMPARISON:  Radiographs of February 24, 2019. FINDINGS: Stable cardiomegaly. No pneumothorax or pleural effusion is noted. No acute pulmonary disease is noted. Bony thorax is unremarkable. IMPRESSION: No active disease. Electronically Signed   By: Marijo Conception M.D.   On: 04/01/2019 11:57    Dialysis Orders:  TTS at [facility] 3:30hr, 300/600, EDW 71kg, 2K/2Ca, AVG, no heparin - Mircera 218mcg IV q 2 weeks (last given 8/11) - Venofer 50mg  IV weekly (just finished course of 1g Venofer - tsat 34% on 8/22) - Calcitriol 2.49mcg PO q HD  Assessment/Plan: 1.  GI bleed/symptomatic anemia: Hgb 4.8 - very low. After discussion, she is agreeable for 1U PRBCs today. Will also give max dose ESA as she is due today. Consider GI consult. 2.  Hyperkalemia: K 6 - no EKG changes per my read. For HD today. 3.  ESRD:  Usual TTS schedule - missing occasionally. HD today, as above. No heparin. 4.  Hypertension/volume: Does have trace LE edema, but very minimal UF goal in setting of extreme anemia. 5.  Metabolic bone disease: Ca ok, Phos pending. Continue home binders and VDRA. 6.  Nutrition: Alb low, will start pro-stat BID once able to eat. 7.  Type 2 DM: ^ glucose - per primary.  Veneta Penton, PA-C 04/01/2019, 2:12 PM  [facility] Pager: [phone]  I have seen and examined this patient and agree with  plan and assessment in the above note with renal recommendations/intervention highlighted.  Will plan for HD today with transfusion of 1 unit PRBC's.   Governor [place]s Tereka Thorley,MD 04/01/2019 3:39 PM

## 2019-04-01 NOTE — Procedures (Signed)
I was present at this dialysis session. I have reviewed the session itself and made appropriate changes.   Vital signs in last 24 hours:  Temp:  [97.5 F (36.4 C)] 97.5 F (36.4 C) (08/27 1128) Pulse Rate:  [70-71] 71 (08/27 1128) Resp:  [13-18] 13 (08/27 1436) BP: (111-127)/(61-65) 127/65 (08/27 1436) SpO2:  [98 %] 98 % (08/27 1128) Weight:  [69.9 kg] 69.9 kg (08/27 1128) Weight change:  Filed Weights   04/01/19 1128  Weight: 69.9 kg    Recent Labs  Lab 04/01/19 1145  NA 136  K 6.0*  CL 93*  CO2 22  GLUCOSE 303*  BUN 111*  CREATININE 7.74*  CALCIUM 8.9    Recent Labs  Lab 04/01/19 1145  WBC 3.4*  HGB 4.8*  HCT 15.5*  MCV 108.4*  PLT 102*    Scheduled Meds: . sodium chloride   Intravenous Once  . [START ON 04/02/2019] Chlorhexidine Gluconate Cloth  6 each Topical Q0600  . darbepoetin (ARANESP) injection - DIALYSIS  200 mcg Intravenous Q Thu-HD  . pantoprazole (PROTONIX) IV  40 mg Intravenous Once   Continuous Infusions: . sodium chloride     PRN Meds:.   Donetta Potts,  MD 04/01/2019, 3:40 PM

## 2019-04-01 NOTE — Progress Notes (Signed)
Patient refused to be on cardiac monitoring. Patient educated. Bodenheimer, NP notified.

## 2019-04-01 NOTE — Progress Notes (Addendum)
VAST consulted to place IV for pt with left arm fistula and restriction. Upon arriving at bedside, pt informed writer that her right elbow is dislocated and the IV needs to be started below elbow. Asked pt about nurse starting IV above elbow during last visit; she stated she had problems with her arm due to that, but wouldn't give further details.  Attempted to stick anterior forearm utilizing [place], but unsuccessful. Pt stated to try again in lower forearm, also unsuccessful. She then begged Probation officer to try again in her hand. IV obtained. Upon obtaining blood sample with IV start, pt stated she did not want blood transfusion. When asked why she stated,  "I'm a Jehovah's Witness and when I let them give me blood transfusion previously it did not help".

## 2019-04-01 NOTE — ED Provider Notes (Addendum)
[facility] EMERGENCY DEPARTMENT Provider Note   CSN: [id number] Arrival date & time: 04/01/19  1121     History   Chief Complaint Chief Complaint  Patient presents with   Chest Pain    HPI [name] is a 53 y.o. female.     Patient with hx esrd/hd on T/TH/Sat, states missed HD Tuesday and today, and has multiple symptoms. States generally feels weak, mild sob, mid chest tightness for past couple days. Symptoms gradual onset, moderate, constant, dull/tightness, not pleuritic, not radiating, occur at rest. Denies episodic or exertional chest pain. Denies cough or sore throat. No fever or chills. Normal appetite. No nvd. Makes small amt urine at baseline, no dysuria. Denies trauma, fall or syncope. No leg pain or swelling. +smoker, hx cocaine use, denies recent. Denies known covid + exposure.   The history is provided by the patient and the EMS personnel.  Chest Pain Associated symptoms: shortness of breath   Associated symptoms: no abdominal pain, no back pain, no cough, no fever, no headache, no numbness and no vomiting     Past Medical History:  Diagnosis Date   Anemia    Anxiety    Bipolar disorder, unspecified ([place]) 02/06/2014   Chronic combined systolic and diastolic CHF (congestive heart failure) (HCC)    EF 40% by echo and 48% by stress test   Chronic pain syndrome    Depression    ESRD on hemodialysis (HCC)    TTS   GERD (gastroesophageal reflux disease)    Glaucoma    Headache    sinus headaches   Hepatitis C    High cholesterol    History of hiatal hernia    History of vitamin D deficiency 06/20/2015   Hypertension    MRSA infection ?2014   "on the back of my head; spread throughout my bloodstream"   Neuropathy    Refusal of blood transfusions as patient is Jehovah's Witness    Refusal of blood transfusions as patient is Jehovah's Witness    Schizoaffective disorder, unspecified condition 02/08/2014   Seizure ([place])  dx'd 2014   "don't know what kind; last one was ~ 04/2014"   TIA (transient ischemic attack) 2010   Type II diabetes mellitus ([place])    INSULIN DEPENDENT    Patient Active Problem List   Diagnosis Date Noted   Type II diabetes mellitus with renal manifestations ([place]) 02/25/2019   TIA (transient ischemic attack) 02/25/2019   GERD (gastroesophageal reflux disease) 02/25/2019   Tobacco abuse 02/25/2019   Fluid overload 02/25/2019   Uremia 02/25/2019   Melena    Rectal bleed 12/30/2018   Gastritis and gastroduodenitis    Benign neoplasm of ascending colon    Benign neoplasm of transverse colon    Benign neoplasm of descending colon    GI bleeding 11/17/2018   Acute blood loss anemia    No blood products    GI bleed 10/05/2018   Prolonged QT interval 10/05/2018   Localized edema due to fluid overload 09/05/2017   Lower GI bleed    Evaluation by psychiatric service required 07/17/2017   Volume overload 06/03/2017   Right supracondylar humerus fracture, with nonunion, subsequent encounter 05/01/2017   Malnutrition of moderate degree 02/28/2017   Leg pain, left 02/25/2017   Left leg pain 02/25/2017   External hemorrhoids    Diverticulosis of colon without hemorrhage    Goals of care, counseling/discussion    Palliative care encounter    Cellulitis of left  leg 01/16/2017   Upper GI bleed 01/16/2017   UGIB (upper gastrointestinal bleed)    Anemia due to chronic kidney disease    Cellulitis of left thigh 01/01/2017   ESRD on dialysis ([place]) 01/01/2017   Cocaine use disorder, severe, dependence ([place]) 08/29/2016   Hepatitis C 08/29/2016   Diabetic ulcer of calf ([place]) 08/29/2016   Diabetic ulcer of toe ([place]) 08/29/2016   Cerebrovascular accident (CVA) ([place]) 08/29/2016   MDD (major depressive disorder) 08/28/2016   Dyslipidemia associated with type 2 diabetes mellitus ([place]) 11/10/2015   Hyperkalemia 09/17/2015   History of vitamin D  deficiency 06/20/2015   Long term prescription opiate use 06/20/2015   Chronic pain syndrome 06/19/2015   Anemia of chronic kidney failure 05/01/2015   transmetatarsal amputation of the left foot 12/13/2014   Unilateral visual loss 11/22/2014   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 ([place]) 11/22/2014   Elevated troponin 11/09/2014   Essential hypertension 11/09/2014   Dizziness    Chronic combined systolic (congestive) and diastolic (congestive) heart failure ([place])    Chest pain 08/25/2014   Rectal bleeding 04/23/2014   Anemia, iron deficiency 04/13/2014   Noncompliance 02/02/2014   Hyponatremia 05/19/2013   Seizure disorder ([place]) 02/09/2013    Past Surgical History:  Procedure Laterality Date   ABDOMINAL HYSTERECTOMY  2014   ABDOMINAL HYSTERECTOMY  2010   AMPUTATION Left 05/21/2013   Procedure: Left Midfoot AMPUTATION;  Surgeon: Newt Minion, MD;  Location: [place];  Service: Orthopedics;  Laterality: Left;  Left Midfoot Amputation   AV FISTULA PLACEMENT Left 05/04/2015   Procedure: ARTERIOVENOUS (AV) GRAFT INSERTION;  Surgeon: Angelia Mould, MD;  Location: [place];  Service: Vascular;  Laterality: Left;   BIOPSY  11/18/2018   Procedure: BIOPSY;  Surgeon: Jerene Bears, MD;  Location: [place];  Service: Gastroenterology;;   COLONOSCOPY WITH PROPOFOL N/A 06/22/2013   Procedure: COLONOSCOPY WITH PROPOFOL;  Surgeon: Jeryl Columbia, MD;  Location: [facility] ENDOSCOPY;  Service: Endoscopy;  Laterality: N/A;   COLONOSCOPY WITH PROPOFOL N/A 11/18/2018   Procedure: COLONOSCOPY WITH PROPOFOL;  Surgeon: Jerene Bears, MD;  Location: [place];  Service: Gastroenterology;  Laterality: N/A;   ENTEROSCOPY N/A 12/31/2018   Procedure: ENTEROSCOPY;  Surgeon: Irene Shipper, MD;  Location: [facility] ENDOSCOPY;  Service: Endoscopy;  Laterality: N/A;  Push enteroscopy at 8am (spoke with Endo)   ESOPHAGOGASTRODUODENOSCOPY N/A 05/11/2015   Procedure: ESOPHAGOGASTRODUODENOSCOPY  (EGD);  Surgeon: Laurence Spates, MD;  Location: [facility] ENDOSCOPY;  Service: Endoscopy;  Laterality: N/A;   ESOPHAGOGASTRODUODENOSCOPY (EGD) WITH PROPOFOL N/A 11/18/2018   Procedure: ESOPHAGOGASTRODUODENOSCOPY (EGD) WITH PROPOFOL;  Surgeon: Jerene Bears, MD;  Location: [facility] ENDOSCOPY;  Service: Gastroenterology;  Laterality: N/A;   EYE SURGERY Bilateral 2010   Lasik   FLEXIBLE SIGMOIDOSCOPY N/A 01/17/2017   Procedure: FLEXIBLE SIGMOIDOSCOPY w/ hemorrhoid banding possible;  Surgeon: Gatha Mayer, MD;  Location: [facility] ENDOSCOPY;  Service: Endoscopy;  Laterality: N/A;   FLEXIBLE SIGMOIDOSCOPY N/A 01/21/2019   Procedure: FLEXIBLE SIGMOIDOSCOPY;  Surgeon: Ronald Lobo, MD;  Location: [facility] ENDOSCOPY;  Service: Endoscopy;  Laterality: N/A;  Plan to do the test unprepped, unsedated   FOOT AMPUTATION Left    due diabetic neuropathy, could not feel ulcer on bottom of foot   GIVENS CAPSULE STUDY N/A 01/21/2019   Procedure: GIVENS CAPSULE STUDY;  Surgeon: Ronald Lobo, MD;  Location: [place];  Service: Endoscopy;  Laterality: N/A;   POLYPECTOMY  11/18/2018   Procedure: POLYPECTOMY;  Surgeon: Jerene Bears, MD;  Location: [facility] ENDOSCOPY;  Service: Gastroenterology;;   REFRACTIVE SURGERY Bilateral 2013   TONSILLECTOMY  1970's   TUBAL LIGATION  2000     OB History    Gravida  0   Para  0   Term  0   Preterm  0   AB  0   Living        SAB  0   TAB  0   Ectopic  0   Multiple      Live Births               Home Medications    Prior to Admission medications   Medication Sig Start Date End Date Taking? Authorizing Provider  acetaminophen (TYLENOL) 325 MG tablet Take 2 tablets (650 mg total) by mouth every 6 (six) hours as needed for fever, headache or moderate pain. 02/26/19   Roxan Hockey, MD  albuterol (VENTOLIN HFA) 108 (90 Base) MCG/ACT inhaler Inhale 2 puffs into the lungs every 4 (four) hours as needed for wheezing or shortness of breath. 01/26/19   Dahal, Marlowe Aschoff, MD    allopurinol (ZYLOPRIM) 100 MG tablet Take 2 tablets (200 mg total) by mouth daily. 02/26/19 03/28/19  Roxan Hockey, MD  atorvastatin (LIPITOR) 40 MG tablet Take 1 tablet (40 mg total) by mouth at bedtime. 02/26/19   Roxan Hockey, MD  benzonatate (TESSALON) 200 MG capsule Take 1 capsule (200 mg total) by mouth 2 (two) times daily as needed for cough. 03/12/19   Mariel Aloe, MD  calcitRIOL (ROCALTROL) 0.25 MCG capsule Take 4 capsules (1 mcg total) by mouth every dialysis (3 times per week on TTS). 02/26/19   Roxan Hockey, MD  Darbepoetin Alfa (ARANESP) 200 MCG/0.4ML SOSY injection Inject 0.4 mLs (200 mcg total) into the vein every Wednesday with hemodialysis. 01/27/19   Terrilee Croak, MD  diphenhydrAMINE (BENADRYL) 25 mg capsule Take 1 capsule (25 mg total) by mouth every 6 (six) hours as needed for up to 8 days for itching. Patient not taking: Reported on 02/25/2019 01/26/19 02/25/28  Terrilee Croak, MD  DULoxetine (CYMBALTA) 30 MG capsule Take 2 capsules (60 mg total) by mouth daily. 03/12/19   Mariel Aloe, MD  hydrocortisone (ANUSOL-HC) 2.5 % rectal cream Place rectally 2 (two) times daily. Patient taking differently: Place 1 application rectally 2 (two) times daily.  01/26/19   Terrilee Croak, MD  hydrOXYzine (ATARAX/VISTARIL) 25 MG tablet Take 1 tablet (25 mg total) by mouth 3 (three) times daily as needed for itching or anxiety. 02/26/19   Roxan Hockey, MD  insulin aspart (NOVOLOG) 100 UNIT/ML injection 0-9 Units, Subcutaneous, 3 times daily with meals CBG < 70: implement hypoglycemia protocol CBG 70 - 120: 0 units CBG 121 - 150: 1 unit CBG 151 - 200: 2 units CBG 201 - 250: 3 units CBG 251 - 300: 5 units CBG 301 - 350: 7 units CBG 351 - 400: 9 units CBG > 400: call MD 01/26/19   Terrilee Croak, MD  labetalol (NORMODYNE) 200 MG tablet Take 1 tablet (200 mg total) by mouth 2 (two) times daily. 02/26/19 03/28/19  Roxan Hockey, MD  lidocaine-prilocaine (EMLA) cream Apply 1 application  topically See admin instructions. On dialysis days 12/23/18   [provider]  mupirocin ointment (BACTROBAN) 2 % Apply 1 application topically as needed (on arm).  12/01/18   [provider]  oxymetazoline (AFRIN) 0.05 % nasal spray Place 2 sprays into both nostrils 2 (two) times daily. 01/26/19   Terrilee Croak,  MD  pantoprazole (PROTONIX) 40 MG tablet Take 1 tablet (40 mg total) by mouth daily at 6 (six) AM for 30 days. 01/26/19 03/12/19  Terrilee Croak, MD  senna-docusate (SENOKOT-S) 8.6-50 MG tablet Take 2 tablets by mouth at bedtime. 02/26/19 02/26/20  Roxan Hockey, MD  traZODone (DESYREL) 50 MG tablet Take 0.5 tablets (25 mg total) by mouth at bedtime. 02/26/19 03/28/19  Roxan Hockey, MD    Family History Family History  Problem Relation Age of Onset   Hypertension Mother    Diabetes Mother    Hypertension Father    Diabetes Father     Social History Social History   Tobacco Use   Smoking status: Current Some Day Smoker    Packs/day: 1.00    Years: 36.00    Pack years: 36.00    Types: Cigarettes   Smokeless tobacco: Never Used   Tobacco comment: 5 cigs in one setting  Substance Use Topics   Alcohol use: Yes    Alcohol/week: 3.0 standard drinks    Types: 3 Standard drinks or equivalent per week    Frequency: Never    Comment: last use about 2 weeks ago   Drug use: Not Currently    Types: "Crack" cocaine, Cocaine    Comment: last use >2 years ago     Allergies   Penicillins, Gabapentin, Lyrica [pregabalin], Saphris [asenapine], Tramadol, Adhesive [tape], Latex, and Other   Review of Systems Review of Systems  Constitutional: Negative for chills and fever.  HENT: Negative for sore throat.   Eyes: Negative for redness.  Respiratory: Positive for shortness of breath. Negative for cough.   Cardiovascular: Positive for chest pain. Negative for leg swelling.  Gastrointestinal: Negative for abdominal pain, diarrhea and vomiting.  Endocrine:  Negative for polyuria.  Genitourinary: Negative for dysuria and flank pain.  Musculoskeletal: Negative for back pain and neck pain.  Skin: Negative for rash.  Neurological: Negative for speech difficulty, numbness and headaches.  Hematological: Does not bruise/bleed easily.  Psychiatric/Behavioral: Negative for confusion.     Physical Exam Updated Vital Signs BP 111/61 (BP Location: Right Arm)    Pulse 71    Temp (!) 97.5 F (36.4 C) (Oral)    Resp 18    Ht 1.626 m (5\' 4" )    Wt 69.9 kg    LMP  (LMP Unknown)    SpO2 98%    BMI 26.43 kg/m   Physical Exam Vitals signs and nursing note reviewed.  Constitutional:      Appearance: Normal appearance. She is well-developed.  HENT:     Head: Atraumatic.     Nose: Nose normal.     Mouth/Throat:     Mouth: Mucous membranes are moist.  Eyes:     General: No scleral icterus.    Conjunctiva/sclera: Conjunctivae normal.     Pupils: Pupils are equal, round, and reactive to light.  Neck:     Musculoskeletal: Normal range of motion and neck supple. No neck rigidity or muscular tenderness.     Trachea: No tracheal deviation.  Cardiovascular:     Rate and Rhythm: Normal rate and regular rhythm.     Pulses: Normal pulses.     Heart sounds: Normal heart sounds. No murmur. No friction rub. No gallop.   Pulmonary:     Effort: Pulmonary effort is normal. No respiratory distress.     Breath sounds: Normal breath sounds.  Abdominal:     General: Bowel sounds are normal. There is no distension.  Palpations: Abdomen is soft.     Tenderness: There is no abdominal tenderness. There is no guarding.  Genitourinary:    Comments: No cva tenderness. Thick black stool, heme positive.  Musculoskeletal:        General: No swelling or tenderness.     Right lower leg: No edema.     Left lower leg: No edema.     Comments: LUE dialysis fistula.   Skin:    General: Skin is warm and dry.     Findings: No rash.  Neurological:     Mental Status: She is  alert.     Comments: Alert, speech normal. Motor intact bil, stre 5/5. sens grossly intact.   Psychiatric:        Mood and Affect: Mood normal.      ED Treatments / Results  Labs (all labs ordered are listed, but only abnormal results are displayed) Results for orders placed or performed during the hospital encounter of 04/01/19  CBC  Result Value Ref Range   WBC 3.4 (L) 4.0 - 10.5 K/uL   RBC 1.43 (L) 3.87 - 5.11 MIL/uL   Hemoglobin 4.8 (LL) 12.0 - 15.0 g/dL   HCT 15.5 (L) 36.0 - 46.0 %   MCV 108.4 (H) 80.0 - 100.0 fL   MCH 33.6 26.0 - 34.0 pg   MCHC 31.0 30.0 - 36.0 g/dL   RDW 16.7 (H) 11.5 - 15.5 %   Platelets 102 (L) 150 - 400 K/uL   nRBC 0.0 0.0 - 0.2 %  Comprehensive metabolic panel  Result Value Ref Range   Sodium 136 135 - 145 mmol/L   Potassium 6.0 (H) 3.5 - 5.1 mmol/L   Chloride 93 (L) 98 - 111 mmol/L   CO2 22 22 - 32 mmol/L   Glucose, Bld 303 (H) 70 - 99 mg/dL   BUN 111 (H) 6 - 20 mg/dL   Creatinine, Ser 7.74 (H) 0.44 - 1.00 mg/dL   Calcium 8.9 8.9 - 10.3 mg/dL   Total Protein 6.4 (L) 6.5 - 8.1 g/dL   Albumin 3.3 (L) 3.5 - 5.0 g/dL   AST 49 (H) 15 - 41 U/L   ALT 47 (H) 0 - 44 U/L   Alkaline Phosphatase 106 38 - 126 U/L   Total Bilirubin 0.8 0.3 - 1.2 mg/dL   GFR calc non Af Amer 5 (L) >60 mL/min   GFR calc Af Amer 6 (L) >60 mL/min   Anion gap 21 (H) 5 - 15  POC occult blood, ED Provider will collect  Result Value Ref Range   Fecal Occult Bld POSITIVE (A) NEGATIVE  Troponin I (High Sensitivity)  Result Value Ref Range   Troponin I (High Sensitivity) 36 (H) <18 ng/L   Dg Chest Port 1 View  Result Date: 04/01/2019 CLINICAL DATA:  Chest pain, shortness of breath. EXAM: PORTABLE CHEST 1 VIEW COMPARISON:  Radiographs of February 24, 2019. FINDINGS: Stable cardiomegaly. No pneumothorax or pleural effusion is noted. No acute pulmonary disease is noted. Bony thorax is unremarkable. IMPRESSION: No active disease. Electronically Signed   By: Marijo Conception M.D.   On:  04/01/2019 11:57    EKG EKG Interpretation  Date/Time:  Thursday April 01 2019 11:26:25 EDT Ventricular Rate:  70 PR Interval:    QRS Duration: 104 QT Interval:  463 QTC Calculation: 500 R Axis:   42 Text Interpretation:  Sinus rhythm Borderline prolonged PR interval LVH with secondary repolarization abnormality Borderline prolonged QT interval No significant change since  last tracing Confirmed by Lajean Saver [phone]) on 04/01/2019 12:13:47 PM   Radiology Dg Chest Port 1 View  Result Date: 04/01/2019 CLINICAL DATA:  Chest pain, shortness of breath. EXAM: PORTABLE CHEST 1 VIEW COMPARISON:  Radiographs of February 24, 2019. FINDINGS: Stable cardiomegaly. No pneumothorax or pleural effusion is noted. No acute pulmonary disease is noted. Bony thorax is unremarkable. IMPRESSION: No active disease. Electronically Signed   By: Marijo Conception M.D.   On: 04/01/2019 11:57    Procedures Procedures (including critical care time)  Medications Ordered in ED Medications - No data to display   Initial Impression / Assessment and Plan / ED Course  I have reviewed the triage vital signs and the nursing notes.  Pertinent labs & imaging results that were available during my care of the patient were reviewed by me and considered in my medical decision making (see chart for details).  Iv ns. Stat labs. Pcxr. ecg .  Reviewed nursing notes and prior charts for additional history.   Labs reviewed by me - hgb very low, 4.8. Pt notes hx severe anemia, requiring transfusion. Blood ordered. Discussed need for transfusion with patient - pt refuses transfusion. Discussed risks including death - pt voices understanding but continues to refuse transfusion.   Xrays reviewed by me - no pna.   Rectal exam, black stool/melena.   Additional labs reviewed - k high, 6. Nephrology stat consulted - discussed pt, hgb 4.8, high k, missed hd with Dr Marval Regal - he will arrange emergent dialysis, requests medicine  service admit.   Pt with admission to hospitalists a few weeks ago - consulted for admission.  Pt with rectal bleeding/anemia 01/2019 - Dr Cristina Gong with endoscopy then without clear source of bleeding. GI also consulted.   Discussed pt with Dr Charolotte Capuchin, incl melena and hgb 4.8 - they will see.   Dr Oletta Lamas called back again to indicate that while Dr Cristina Gong did see pt in June w endoscopy then, that patient has not followed up in office, and that she has always seen in hospital whoever was on call for gi then - he requests that the gi on call doc for unassigned be paged. GI on call paged.   Protonix iv.   Patient initially refuses transfusion - delaying transfusion.  With additional discussions with patient - pt does subsequently give consent for transfusion.   Plan for emergent dialysis for missed hd, high K, etc, as well as emergent transfusion prbc.   CRITICAL CARE RE: ESRD/HD, missed dialysis, hyperkalemia, need to emergent dialysis, also with severe, symptomatic anemia requiring emergent transfusion. Performed by: Mirna Mires Total critical care time: 130 minutes Critical care time was exclusive of separately billable procedures and treating other patients. Critical care was necessary to treat or prevent imminent or life-threatening deterioration. Critical care was time spent personally by me on the following activities: development of treatment plan with patient and/or surrogate as well as nursing, discussions with consultants, evaluation of patient's response to treatment, examination of patient, obtaining history from patient or surrogate, ordering and performing treatments and interventions, ordering and review of laboratory studies, ordering and review of radiographic studies, pulse oximetry and re-evaluation of patient's condition.  Final Clinical Impressions(s) / ED Diagnoses   Final diagnoses:  None    ED Discharge Orders    None           Lajean Saver,  MD 04/01/19 1521

## 2019-04-01 NOTE — Progress Notes (Signed)
New Admission Note: ? Arrival Method: via stretcher Mental Orientation: A/O x 4 Telemetry: Patient refused and educated. Assessment: Completed Skin: Refer to flowsheet IV: Right hand  Pain: 10/10 Tubes: Safety Measures: Safety Fall Prevention Plan discussed with patient. Admission: Completed [facility] [place] Orientation: Patient has been orientated to the room, unit and the staff. Family: Orders have been reviewed and are being implemented. Will continue to monitor the patient. Call light has been placed within reach and bed alarm has been activated.  ? [employer], [place]

## 2019-04-01 NOTE — Progress Notes (Signed)
Pt received 2 units of PRBCs; no s/s of a rxn noted; tolerated the blood transfusion well.

## 2019-04-01 NOTE — ED Triage Notes (Signed)
PT getting ready for MD appt. And had CP. EMS transported PT and gave 324 of ASA and I -0.4 NGT  Sl . CP went from a 9/10  To 0/10 . Pt A/O on arrival to ED room.

## 2019-04-01 NOTE — H&P (Signed)
History and Physical    [name] FGH:[id number] DOB: 1966/06/11 DOA: 04/01/2019  PCP: Rocco Serene, MD Consultants:   [facility] - GI; Nephrology Patient coming from:  Home - lives with ; [facility]:   Chief Complaint: chest pain  HPI: [name] is a 53 y.o. female with medical history significant of ESRD on TTS HD; HTN; HLD: DM; depression/anxiety/bipolar disorder;  polysubstance abuse; tobacco abuse; and CHF with EF of 45% presenting with chest pain.  She was supposed to be going to the doctor and she was took weak to get to the [facility] today.  She was weak, the world went white.  She has been seeing blood in her stools, thought it was cranberry juice.  She was cramping in her neck and head.  The blood was rushing in her head.  She was last at HD on Monday, missed Tuesday/Wednesday.  She would like to call her PCP to ask for higher dose pain pills because her pain is poorly controlled.  She hasn't been able to eat well.  No fevers.  She was raised as a Jehovah's Witness but never baptised and so will take blood products.  She has diffuse follicular lesions and was supposed to go to the doctor today.  They are on her left arm, head.  She was last admitted from 8/5-7 for uremia secondary to missed HD with chronic hematochezia, heme positive, no current etiology, stable.   She was previously admitted from 7/22-24 for SOB and CP associated with missed HD.  She was also admitted from 6/17-23 for GI bleeding; she had flex sig during that hospitalization with no obvious source of bleeding and negative capsule endoscopy.  Recent GI procedures: 4/15 - Colonoscopy - hemorrhoids; 4 mm polyp in ascending colon, removed; 2 sessile polyps in transverse colon, removed; 5 mm polyp in descending colon, sessile, vascular-appearing but not actively bleeding, removed with hot snare; mild melanosis diffuse and few small-mouthed diverticula in the sigmoid colon 4/15 - EGD - mild gastritis/duodenitis, no active bleeding  5/28 - Small bowel enteroscopy - unremarkable 6/18 - Flex sig - dark stool in rectum and sigmoid colon, minimal burgundy-tinged stool and mucosa without frank blood 6/22 - Capsule endoscopy - no active/recent bleeding   ED Course:  Non-compliant with HD, presents with many symptoms.  Feeling weak, fatigued, SOB, CP, black stools.  Hgb 4.8.  Ordered blood - reluctant to agree due to ?Jehovah's Witness.  Nephrology to arrange HD.  [facility] consult pending, had endo/colo in June.  Review of Systems: As per HPI; otherwise review of systems reviewed and negative.   Ambulatory Status:  Ambulates without assistance  Past Medical History:  Diagnosis Date  . Anemia   . Anxiety   . Bipolar disorder, unspecified ([place]) 02/06/2014  . Chronic combined systolic and diastolic CHF (congestive heart failure) (HCC)    EF 40% by echo and 48% by stress test  . Chronic pain syndrome   . Depression   . ESRD on hemodialysis (HCC)    TTS  . GERD (gastroesophageal reflux disease)   . Glaucoma   . Headache    sinus headaches  . Hepatitis C   . High cholesterol   . History of hiatal hernia   . History of vitamin D deficiency 06/20/2015  . Hypertension   . MRSA infection ?2014   "on the back of my head; spread throughout my bloodstream"  . Neuropathy   . Refusal of blood transfusions as patient is Jehovah's Witness   .  Refusal of blood transfusions as patient is Jehovah's Witness   . Schizoaffective disorder, unspecified condition 02/08/2014  . Seizure [facility]) dx'd 2014   "don't know what kind; last one was ~ 04/2014"  . TIA (transient ischemic attack) 2010  . Type II diabetes mellitus ([place])    INSULIN DEPENDENT    Past Surgical History:  Procedure Laterality Date  . ABDOMINAL HYSTERECTOMY  2014  . ABDOMINAL HYSTERECTOMY  2010  . AMPUTATION Left 05/21/2013   Procedure: Left Midfoot AMPUTATION;  Surgeon: Newt Minion, MD;  Location: [place];  Service: Orthopedics;  Laterality: Left;  Left Midfoot  Amputation  . AV FISTULA PLACEMENT Left 05/04/2015   Procedure: ARTERIOVENOUS (AV) GRAFT INSERTION;  Surgeon: Angelia Mould, MD;  Location: [place];  Service: Vascular;  Laterality: Left;  . BIOPSY  11/18/2018   Procedure: BIOPSY;  Surgeon: Jerene Bears, MD;  Location: [facility] ENDOSCOPY;  Service: Gastroenterology;;  . COLONOSCOPY WITH PROPOFOL N/A 06/22/2013   Procedure: COLONOSCOPY WITH PROPOFOL;  Surgeon: Jeryl Columbia, MD;  Location: [facility] ENDOSCOPY;  Service: Endoscopy;  Laterality: N/A;  . COLONOSCOPY WITH PROPOFOL N/A 11/18/2018   Procedure: COLONOSCOPY WITH PROPOFOL;  Surgeon: Jerene Bears, MD;  Location: [place];  Service: Gastroenterology;  Laterality: N/A;  . ENTEROSCOPY N/A 12/31/2018   Procedure: ENTEROSCOPY;  Surgeon: Irene Shipper, MD;  Location: [facility] ENDOSCOPY;  Service: Endoscopy;  Laterality: N/A;  Push enteroscopy at 8am (spoke with Endo)  . ESOPHAGOGASTRODUODENOSCOPY N/A 05/11/2015   Procedure: ESOPHAGOGASTRODUODENOSCOPY (EGD);  Surgeon: Laurence Spates, MD;  Location: [facility] ENDOSCOPY;  Service: Endoscopy;  Laterality: N/A;  . ESOPHAGOGASTRODUODENOSCOPY (EGD) WITH PROPOFOL N/A 11/18/2018   Procedure: ESOPHAGOGASTRODUODENOSCOPY (EGD) WITH PROPOFOL;  Surgeon: Jerene Bears, MD;  Location[facility]COPY;  Service: Gastroenterology;  Laterality: N/A;  . EYE SURGERY Bilateral 2010   Lasik  . FLEXIBLE SIGMOIDOSCOPY N/A 01/17/2017   Procedure: FLEXIBLE SIGMOIDOSCOPY w/ hemorrhoid banding possible;  Surgeon: Gatha Mayer, MD;  Location: [facility] ENDOSCOPY;  Service: Endoscopy;  Laterality: N/A;  . FLEXIBLE SIGMOIDOSCOPY N/A 01/21/2019   Procedure: FLEXIBLE SIGMOIDOSCOPY;  Surgeon: Ronald Lobo, MD;  Location[facility] END[facility]COPY;  Service: Endoscopy;  Laterality: N/A;  Plan to do the test unprepped, unsedated  . FOOT AMPUTATION Left    due diabetic neuropathy, could not feel ulcer on bottom of foot  . GIVENS CAPSULE STUDY N/A 01/21/2019   Procedure: GIVENS CAPSULE STUDY;  Surgeon: Ronald Lobo,  MD;  Location: [facility] ENDOSCOPY;  Service: Endoscopy;  Laterality: N/A;  . POLYPECTOMY  11/18/2018   Procedure: POLYPECTOMY;  Surgeon: Jerene Bears, MD;  Location: [facility] ENDOSCOPY;  Service: Gastroenterology;;  . REFRACTIVE SURGERY Bilateral 2013  . TONSILLECTOMY  1970's  . TUBAL LIGATION  2000    Social History   Socioeconomic History  . Marital status: Single    Spouse name: Not on file  . Number of children: 3  . Years of education: 51  . Highest education level: 12th grade  Occupational History  . Occupation: disabled  Social Needs  . Financial resource strain: Not on file  . Food insecurity    Worry: Not on file    Inability: Not on file  . Transportation needs    Medical: Not on file    Non-medical: Not on file  Tobacco Use  . Smoking status: Current Some Day Smoker    Packs/day: 1.00    Years: 36.00    Pack years: 36.00    Types: Cigarettes  . Smokeless tobacco: Never Used  .  Tobacco comment: 5 cigs in one setting  Substance and Sexual Activity  . Alcohol use: Yes    Alcohol/week: 3.0 standard drinks    Types: 3 Standard drinks or equivalent per week    Frequency: Never    Comment: last use about 2 weeks ago  . Drug use: Not Currently    Types: "Crack" cocaine, Cocaine    Comment: last use >2 years ago  . Sexual activity: Not Currently    Birth control/protection: Abstinence  Lifestyle  . Physical activity    Days per week: Not on file    Minutes per session: Not on file  . Stress: Not on file  Relationships  . Social Herbalist on phone: Not on file    Gets together: Not on file    Attends religious service: Not on file    Active member of club or organization: Not on file    Attends meetings of clubs or organizations: Not on file    Relationship status: Not on file  . Intimate partner violence    Fear of current or ex partner: Not on file    Emotionally abused: Not on file    Physically abused: Not on file    Forced sexual activity: Not on  file  Other Topics Concern  . Not on file  Social History Narrative   ** Merged History Encounter **   Single, lives with son in a one story home. Unable to exercise. Avoids caffeine. 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        Allergies  Allergen Reactions  . Penicillins Hives and Other (See Comments)    Has patient had a PCN reaction causing immediate rash, facial/tongue/throat swelling, SOB or lightheadedness with hypotension: Yes Has patient had a PCN reaction causing severe rash involving mucus membranes or skin necrosis: No Has patient had a PCN reaction that required hospitalization No Has patient had a PCN reaction occurring within the last 10 years: No If all of the above answers are "NO", then may proceed with Cephalosporin use. Pt tolerates cefepime and ceftriaxone  . Gabapentin Itching and Swelling  . Lyrica [Pregabalin] Swelling and Other (See Comments)    Facial and leg swelling  . Saphris [Asenapine] Swelling and Other (See Comments)    Facial swelling  . Tramadol Swelling and Other (See Comments)    Leg swelling  . Adhesive [Tape] Itching    SEVERE ITCHING  . Latex Itching    SEVERE ITCHING  . Other Other (See Comments)    NO BLOOD PRODUCTS; PATIENT IS A JEHOVAH'S WITNESS    Family History  Problem Relation Age of Onset  . Hypertension Mother   . Diabetes Mother   . Hypertension Father   . Diabetes Father     Prior to Admission medications   Medication Sig Start Date End Date Taking? Authorizing Provider  acetaminophen (TYLENOL) 325 MG tablet Take 2 tablets (650 mg total) by mouth every 6 (six) hours as needed for fever, headache or moderate pain. 02/26/19   Roxan Hockey, MD  albuterol (VENTOLIN HFA) 108 (90 Base) MCG/ACT inhaler Inhale 2 puffs into the lungs every 4 (four) hours as needed for wheezing or shortness of breath. 01/26/19   Dahal, Marlowe Aschoff, MD  allopurinol (ZYLOPRIM) 100 MG tablet Take 2 tablets  (200 mg total) by mouth daily. 02/26/19 03/28/19  Roxan Hockey, MD  atorvastatin (LIPITOR) 40 MG tablet Take 1 tablet (40 mg total) by  mouth at bedtime. 02/26/19   Roxan Hockey, MD  benzonatate (TESSALON) 200 MG capsule Take 1 capsule (200 mg total) by mouth 2 (two) times daily as needed for cough. 03/12/19   Mariel Aloe, MD  calcitRIOL (ROCALTROL) 0.25 MCG capsule Take 4 capsules (1 mcg total) by mouth every dialysis (3 times per week on TTS). 02/26/19   Roxan Hockey, MD  Darbepoetin Alfa (ARANESP) 200 MCG/0.4ML SOSY injection Inject 0.4 mLs (200 mcg total) into the vein every Wednesday with hemodialysis. 01/27/19   Terrilee Croak, MD  diphenhydrAMINE (BENADRYL) 25 mg capsule Take 1 capsule (25 mg total) by mouth every 6 (six) hours as needed for up to 8 days for itching. Patient not taking: Reported on 02/25/2019 01/26/19 02/25/28  Terrilee Croak, MD  DULoxetine (CYMBALTA) 30 MG capsule Take 2 capsules (60 mg total) by mouth daily. 03/12/19   Mariel Aloe, MD  hydrocortisone (ANUSOL-HC) 2.5 % rectal cream Place rectally 2 (two) times daily. Patient taking differently: Place 1 application rectally 2 (two) times daily.  01/26/19   Terrilee Croak, MD  hydrOXYzine (ATARAX/VISTARIL) 25 MG tablet Take 1 tablet (25 mg total) by mouth 3 (three) times daily as needed for itching or anxiety. 02/26/19   Roxan Hockey, MD  insulin aspart (NOVOLOG) 100 UNIT/ML injection 0-9 Units, Subcutaneous, 3 times daily with meals CBG < 70: implement hypoglycemia protocol CBG 70 - 120: 0 units CBG 121 - 150: 1 unit CBG 151 - 200: 2 units CBG 201 - 250: 3 units CBG 251 - 300: 5 units CBG 301 - 350: 7 units CBG 351 - 400: 9 units CBG > 400: call MD 01/26/19   Terrilee Croak, MD  labetalol (NORMODYNE) 200 MG tablet Take 1 tablet (200 mg total) by mouth 2 (two) times daily. 02/26/19 03/28/19  Roxan Hockey, MD  lidocaine-prilocaine (EMLA) cream Apply 1 application topically See admin instructions. On dialysis days 12/23/18    [provider]  mupirocin ointment (BACTROBAN) 2 % Apply 1 application topically as needed (on arm).  12/01/18   [provider]  oxymetazoline (AFRIN) 0.05 % nasal spray Place 2 sprays into both nostrils 2 (two) times daily. 01/26/19   Terrilee Croak, MD  pantoprazole (PROTONIX) 40 MG tablet Take 1 tablet (40 mg total) by mouth daily at 6 (six) AM for 30 days. 01/26/19 03/12/19  Terrilee Croak, MD  senna-docusate (SENOKOT-S) 8.6-50 MG tablet Take 2 tablets by mouth at bedtime. 02/26/19 02/26/20  Roxan Hockey, MD  traZODone (DESYREL) 50 MG tablet Take 0.5 tablets (25 mg total) by mouth at bedtime. 02/26/19 03/28/19  Roxan Hockey, MD    Physical Exam: Vitals:   04/01/19 1730 04/01/19 1738 04/01/19 1755 04/01/19 1759  BP: (!) 167/79 (!) 175/81 (!) 176/82 (!) (P) 172/87  Pulse: 86 87 87 (P) 87  Resp: (!) 21 15 19  (P) 20  Temp:  97.6 F (36.4 C) (P) 97.6 F (36.4 C) (P) 97.6 F (36.4 C)  TempSrc:  Oral Oral (P) Oral  SpO2:      Weight:      Height:         . General:  Appears calm and comfortable and is NAD . Eyes:  PERRL, EOMI, normal lids, iris . ENT:  grossly normal hearing, lips & tongue, mmm . Neck:  no LAD, masses or thyromegaly . Cardiovascular:  RRR, no m/r/g. No LE edema.  Marland Kitchen Respiratory:   CTA bilaterally with no wheezes/rales/rhonchi.  Normal respiratory effort. . Abdomen:  soft, NT, ND,  NABS . Back:   normal alignment, no CVAT . Skin:  no rash or induration seen on limited exam; mild diffuse follicular lesions that are not clearly infected at this time . Musculoskeletal:  grossly normal tone BUE/BLE, good ROM, no bony abnormality; s/p L TMA . Lower extremity:  No LE edema.  Limited foot exam with no significant ulcerations (has small lesion on left plantar foot where she stepped on glass recently, but no surrounding erythema or current concern for infection).  2+ distal pulses. Marland Kitchen Psychiatric:  grossly normal mood and affect, speech fluent and appropriate,  AOx3 . Neurologic:  CN 2-12 grossly intact, moves all extremities in coordinated fashion    Radiological Exams on Admission: Dg Chest Port 1 View  Result Date: 04/01/2019 CLINICAL DATA:  Chest pain, shortness of breath. EXAM: PORTABLE CHEST 1 VIEW COMPARISON:  Radiographs of February 24, 2019. FINDINGS: Stable cardiomegaly. No pneumothorax or pleural effusion is noted. No acute pulmonary disease is noted. Bony thorax is unremarkable. IMPRESSION: No active disease. Electronically Signed   By: Marijo Conception M.D.   On: 04/01/2019 11:57    EKG: Independently reviewed.  NSR with rate 70; nonspecific ST changes with no evidence of acute ischemia; NSCSLT   Labs on Admission: I have personally reviewed the available labs and imaging studies at the time of the admission.  Pertinent labs:   K+ 6.0 Glucose 303 BUN 111/Creatinine 7.74/GFR 6 Anion gap 21 Albumin 3.3 AST 49/ALT 47 WBC 3.4 Hgb 4.8 Plt 102 HS troponin 36 Heme positive   Assessment/Plan Principal Problem:   Symptomatic anemia Active Problems:   Chronic combined systolic (congestive) and diastolic (congestive) heart failure (HCC)   Essential hypertension   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 (HCC)   Chronic pain syndrome   Dyslipidemia associated with type 2 diabetes mellitus (HCC)   Cocaine use disorder, severe, dependence ([place])   ESRD on dialysis ([place])   GI bleeding   Symptomatic anemia -Patient with prior admissions for the same, presenting with what appears to be symptomatic anemia (in addition to volume overload from HD noncompliance) -Heme testing was again positive in the ER -Will admit to telemetry -Would prefer to transfuse 2 units PRBC; nephrology prefers only 1 unit for now.   Recurrent GI bleeding -Patient has had extensive evaluation without clear cause for her recurrent bleeding -Her bleeding does not appear to be brisk enough to show up on tagged RBC scan, but it may be worth trying this test  -She may require repeat c-scope -Will defer to GI  ESRD on HD -Patient on chronic TTS HD -Nephrology prn order set utilized -She is in need of acute HD and was heading to HD at the time of my evaluation -She needs ongoing encouragement regarding the importance of compliance with HD  DM -A1c in 7/20 was 5.6, indicating good control -Cover with sensitive-scale SSI  HTN -Continue Labetalol  HLD -Continue Lipitor  Chronic combined CHF -Echo in 6/17 with EF 45-50% and grade 2 diastolic dysfunction -Not markedly overloaded on my exam -Fluid balance with HD  Chronic pain -I have reviewed this patient in the [place] Controlled Substances Reporting System.  She is receiving medications from multiple providers and is at very high risk of opioid misuse, diversion, or overdose. -She reports that she is planning to call her PCP to get something stronger because her current oxycodone is insufficient for controlling her pain -Will give Oxy IR for now, but given her opiate patterns, would be  reluctant to provide escalating doses of medication and would consider outpatient titration off opiates if possible  Cocaine dependence -UDS last performed in 10/17 but consistently positive for cocaine prior  -Will recheck for counseling purposes  Note: This patient has been tested and is negative for the novel coronavirus COVID-19.  DVT prophylaxis:  SCDs Code Status:  Full - confirmed with patient Family Communication: None present Disposition Plan:  Home once clinically improved Consults called: Nephrology; GI  Admission status: Admit - It is my clinical opinion that admission to INPATIENT is reasonable and necessary because of the expectation that this patient will require hospital care that crosses at least 2 midnights to treat this condition based on the medical complexity of the problems presented.  Given the aforementioned information, the predictability of an adverse outcome is felt to be  significant.    Karmen Bongo MD [facility]   How to contact the [facility] Attending or Consulting provider [place] or covering provider during after hours [place], for this patient?  1. Check the care team in [facility] and look for a) attending/consulting [facility] provider listed and b) the [facility] team listed 2. Log into [email or web address] and use [place]'s universal password to access. If you do not have the password, please contact the hospital operator. 3. Locate the [facility] provider you are looking for under [facility] and page to a number that you can be directly reached. 4. If you still have difficulty reaching the provider, please page the [facility] (Director on Call) for the Hospitalists listed on amion for assistance.   04/01/2019, 6:21 PM

## 2019-04-02 ENCOUNTER — Other Ambulatory Visit: Payer: Self-pay

## 2019-04-02 ENCOUNTER — Inpatient Hospital Stay (HOSPITAL_COMMUNITY): Payer: Medicare Other

## 2019-04-02 ENCOUNTER — Encounter (HOSPITAL_COMMUNITY): Payer: Self-pay | Admitting: Physician Assistant

## 2019-04-02 DIAGNOSIS — D649 Anemia, unspecified: Secondary | ICD-10-CM

## 2019-04-02 DIAGNOSIS — K921 Melena: Secondary | ICD-10-CM

## 2019-04-02 LAB — BASIC METABOLIC PANEL
Anion gap: 13 (ref 5–15)
BUN: 36 mg/dL — ABNORMAL HIGH (ref 6–20)
CO2: 28 mmol/L (ref 22–32)
Calcium: 8.5 mg/dL — ABNORMAL LOW (ref 8.9–10.3)
Chloride: 96 mmol/L — ABNORMAL LOW (ref 98–111)
Creatinine, Ser: 3.47 mg/dL — ABNORMAL HIGH (ref 0.44–1.00)
GFR calc Af Amer: 17 mL/min — ABNORMAL LOW (ref >60)
GFR calc non Af Amer: 14 mL/min — ABNORMAL LOW (ref >60)
Glucose, Bld: 205 mg/dL — ABNORMAL HIGH (ref 70–99)
Potassium: 4.1 mmol/L (ref 3.5–5.1)
Sodium: 137 mmol/L (ref 135–145)

## 2019-04-02 LAB — PREPARE RBC (CROSSMATCH)

## 2019-04-02 LAB — HEMOGLOBIN AND HEMATOCRIT, BLOOD
HCT: 23.1 % — ABNORMAL LOW (ref 36.0–46.0)
HCT: 20.6 % — ABNORMAL LOW (ref 36.0–46.0)
HCT: 20.8 % — ABNORMAL LOW (ref 36.0–46.0)
Hemoglobin: 7.3 g/dL — ABNORMAL LOW (ref 12.0–15.0)
Hemoglobin: 6.6 g/dL — CL (ref 12.0–15.0)
Hemoglobin: 7 g/dL — ABNORMAL LOW (ref 12.0–15.0)

## 2019-04-02 LAB — GLUCOSE, CAPILLARY
Glucose-Capillary: 218 mg/dL — ABNORMAL HIGH (ref 70–99)
Glucose-Capillary: 167 mg/dL — ABNORMAL HIGH (ref 70–99)
Glucose-Capillary: 199 mg/dL — ABNORMAL HIGH (ref 70–99)

## 2019-04-02 LAB — CBC
HCT: 23.1 % — ABNORMAL LOW (ref 36.0–46.0)
Hemoglobin: 7.4 g/dL — ABNORMAL LOW (ref 12.0–15.0)
MCH: 32 pg (ref 26.0–34.0)
MCHC: 32 g/dL (ref 30.0–36.0)
MCV: 100 fL (ref 80.0–100.0)
Platelets: 121 10*3/uL — ABNORMAL LOW (ref 150–400)
RBC: 2.31 MIL/uL — ABNORMAL LOW (ref 3.87–5.11)
RDW: 18.1 % — ABNORMAL HIGH (ref 11.5–15.5)
WBC: 4 10*3/uL (ref 4.0–10.5)
nRBC: 0.5 % — ABNORMAL HIGH (ref 0.0–0.2)

## 2019-04-02 LAB — HIV ANTIBODY (ROUTINE TESTING W REFLEX): HIV Screen 4th Generation wRfx: NONREACTIVE

## 2019-04-02 MED ORDER — CHLORHEXIDINE GLUCONATE CLOTH 2 % EX PADS
6.0000 | MEDICATED_PAD | Freq: Every day | CUTANEOUS | Status: DC
  Administered 2019-04-03: 6 via TOPICAL

## 2019-04-02 MED ORDER — IOHEXOL 350 MG/ML SOLN
100.0000 mL | Freq: Once | INTRAVENOUS | Status: AC | PRN
  Administered 2019-04-02: 100 mL via INTRAVENOUS

## 2019-04-02 MED ORDER — BACITRACIN-NEOMYCIN-POLYMYXIN OINTMENT TUBE
TOPICAL_OINTMENT | Freq: Four times a day (QID) | CUTANEOUS | Status: DC
  Administered 2019-04-02 – 2019-04-03 (×3): via TOPICAL
  Filled 2019-04-02 (×3): qty 14

## 2019-04-02 MED ORDER — LABETALOL HCL 200 MG PO TABS
200.0000 mg | ORAL_TABLET | Freq: Two times a day (BID) | ORAL | Status: DC
  Administered 2019-04-02 – 2019-04-03 (×3): 200 mg via ORAL
  Filled 2019-04-02 (×3): qty 1

## 2019-04-02 MED ORDER — INSULIN DETEMIR 100 UNIT/ML ~~LOC~~ SOLN
10.0000 [IU] | Freq: Every day | SUBCUTANEOUS | Status: DC
  Administered 2019-04-02: 10 [IU] via SUBCUTANEOUS
  Filled 2019-04-02 (×2): qty 0.1

## 2019-04-02 MED ORDER — SODIUM CHLORIDE 0.9% IV SOLUTION
Freq: Once | INTRAVENOUS | Status: AC
  Administered 2019-04-02: 17:00:00 via INTRAVENOUS

## 2019-04-02 MED ORDER — HYDROCORTISONE 1 % EX CREA
TOPICAL_CREAM | Freq: Four times a day (QID) | CUTANEOUS | Status: DC
  Administered 2019-04-02 – 2019-04-03 (×3): via TOPICAL
  Filled 2019-04-02: qty 28

## 2019-04-02 MED ORDER — SODIUM CHLORIDE 0.9% IV SOLUTION: Freq: Once | INTRAVENOUS | Status: DC

## 2019-04-02 MED ORDER — BACIT-POLY-NEO HC 1 % EX OINT: TOPICAL_OINTMENT | Freq: Four times a day (QID) | CUTANEOUS | Status: DC

## 2019-04-02 MED ORDER — INSULIN ASPART 100 UNIT/ML ~~LOC~~ SOLN
0.0000 [IU] | Freq: Three times a day (TID) | SUBCUTANEOUS | Status: DC
  Administered 2019-04-02: 3 [IU] via SUBCUTANEOUS
  Administered 2019-04-02: 2 [IU] via SUBCUTANEOUS

## 2019-04-02 MED ORDER — HYDROCORTISONE 1 % EX LOTN
TOPICAL_LOTION | Freq: Four times a day (QID) | CUTANEOUS | Status: DC
  Filled 2019-04-02: qty 118

## 2019-04-02 NOTE — Plan of Care (Signed)
  Problem: Education: Goal: Knowledge of General Education information will improve Description: Including pain rating scale, medication(s)/side effects and non-pharmacologic comfort measures Outcome: Progressing   Problem: Coping: Goal: Level of anxiety will decrease Outcome: Progressing   Problem: Pain Managment: Goal: General experience of comfort will improve Outcome: Progressing   

## 2019-04-02 NOTE — Consult Note (Addendum)
[facility] Consult: 2:13 PM 04/02/2019  LOS: 1 day    Referring Provider: Dr Roger Shelter  Primary Care Physician:  Rocco Serene, MD Primary Gastroenterologist:  Althia Forts.  Has been seen as an inpatient by [facility], [facility] and [facility].   Reason for Consultation: GI bleed.  Bloody stools.   HPI: [name] is a 53 y.o. female.  Hx IDDM.  HTN.  ESRD on HD, HD TTS.  Bipolar, schizoaffective D/O.  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 Peripheral neuropathy. Chronic polyarticular and all over body muscle pain managed with stable doses of oxycodone. Diastolic and systolic heart failure.R supracondylar elbow fracture with non-union. Hx Hepatitis C.  HCV quant 5,590,000 on 01/25/19.Jehovoh's Witness.   Recurrent issues with hematochezia, burgundy bloody stools, melena a/w blood loss anemia.  Though she is JW, consented to blood transfusions in 11/2014 (2), 09/2015 (3), 10/2018 (2) and 11/2018 (3) and last night (2).     06/2013 Colonoscopy, Dr. Watt Climes.For hematochezia.Poor prep limited visualization, so lesions could have been missed.  However, no fresh or old blood.  Small internal and external hemorrhoids with a small associated, but non-bleeding,tear, likely source of the reported red blood. 05/11/15 EGD, Dr. Laurence Spates.For heme positive stool and right upper quadrant pain.Study unremarkable. Pain attributed to gallstones. Latest liver imaging: 05/2015 CT with contrast: liver normal.  On 07/2015 ultrasound liver texture "coarse" as previously noted.  10/06/2018 nuclear medicine RBC scan: No active bleeding 11/18/2018 EGD for hematochezia, melena. Dr Hilarie Fredrickson.  Mild gastritis, not bleeding, biopsied.  Mild, nonbleeding duodenitis.  Path showed reactive gastropathy.  No H. pylori or intestinal metaplasia.    11/18/2018 Colonoscopy. Dr Hilarie Fredrickson. Hemorrhoids.  Total of 4 polyps encountered sized to millimeters.  Sigmoid diverticulosis.  No active bleeding or presence of blood.  Colon polyp pathology revealed fragments of hyperplastic polyps, submucosa leiomyoma, benign colonic mucosa, no HGD or malignancy. 12/31/2018 SB enteroscopy.  Dr Henrene Pastor.   Unremarkable study.   No old blood, no fresh blood. Recommend CT angiography for recurrent acute bleeding.   01/21/2019 flexible sigmoidoscopy: Buccini.  Perianal skin tags but no significant hemorrhoids.  Dark stool in the rectum and sigmoid, minimally burgundy tinged stool and mucosa but no frank blood.  No bleeding source identified. 01/22/2019 CTAP without contrast: No source for GI bleed.  Mild, nonspecific wall thickening at GE junction.  Moderate colonic stool burden.  Uncomplicated cholelithiasis.  Advanced aortic and branch vessel atherosclerosis.  Coronary artery calcifications.  Cardiomegaly.  Borderline mild hepatosplenomegaly. 01/25/2019 Capsule endoscopy: Buccini.  No evidence of active or recent bleeding.  No suspicious lesions.  No source for recurrent bleeding and recurrent anemia encountered 01/25/2019 abdominal ultrasound with liver elastography: Metavir fibrosis score F3 + F4. High risk of fibrosis.  Dialysis schedule this week altered to allow for derm appointment yesterday, Thursday.  HD on Monday but missed planned Wednesday dialysis.  Is not unusual for her to no-show for dialysis and she is often hyperkalemic. Labs predialysis 8/22 showed Hgb 8.1, potassium 7.3 Felt presyncopal at dermatolgist appointment in the morning of 8/27. Drank  some cranberry juice earlier this week and noticed that there was a reddish color to her stools.  She again noticed this yesterday.  Today she is had what she describes as red wine colored stools, nursing says it is a little more bright red than that.  Low-level pain in her left abdomen.  No nausea.  No vomiting.  No ASA  or NSAIDs.  Admitted again with GIB and Hgb 4.8 >> 2 PRBCs >> 7.3. K 6 >> 4.1 post HD last night..   She is a Sales promotion account executive Witness, initially declined transfusion, ultimately allowed.  Listed meds include Protonix 40 mg/day.  Venofir weekly, Mircera q 2 weeks.      Past Medical History:  Diagnosis Date  . Anemia   . Anxiety   . Bipolar disorder, unspecified ([place]) 02/06/2014  . Chronic combined systolic and diastolic CHF (congestive heart failure) (HCC)    EF 40% by echo and 48% by stress test  . Chronic pain syndrome   . Depression   . ESRD on hemodialysis (HCC)    TTS  . GERD (gastroesophageal reflux disease)   . Glaucoma   . Headache    sinus headaches  . Hepatitis C   . High cholesterol   . History of hiatal hernia   . History of vitamin D deficiency 06/20/2015  . Hypertension   . MRSA infection ?2014   "on the back of my head; spread throughout my bloodstream"  . Neuropathy   . Refusal of blood transfusions as patient is Jehovah's Witness   . Refusal of blood transfusions as patient is Jehovah's Witness   . Schizoaffective disorder, unspecified condition 02/08/2014  . Seizure [facility]) dx'd 2014   "don't know what kind; last one was ~ 04/2014"  . TIA (transient ischemic attack) 2010  . Type II diabetes mellitus ([place])    INSULIN DEPENDENT    Past Surgical History:  Procedure Laterality Date  . ABDOMINAL HYSTERECTOMY  2014  . ABDOMINAL HYSTERECTOMY  2010  . AMPUTATION Left 05/21/2013   Procedure: Left Midfoot AMPUTATION;  Surgeon: Newt Minion, MD;  Location: [place];  Service: Orthopedics;  Laterality: Left;  Left Midfoot Amputation  . AV FISTULA PLACEMENT Left 05/04/2015   Procedure: ARTERIOVENOUS (AV) GRAFT INSERTION;  Surgeon: Angelia Mould, MD;  Location: [place];  Service: Vascular;  Laterality: Left;  . BIOPSY  11/18/2018   Procedure: BIOPSY;  Surgeon: Jerene Bears, MD;  Location: [facility] ENDOSCOPY;  Service: Gastroenterology;;  . COLONOSCOPY WITH PROPOFOL N/A  06/22/2013   Procedure: COLONOSCOPY WITH PROPOFOL;  Surgeon: Jeryl Columbia, MD;  Location: [facility] ENDOSCOPY;  Service: Endoscopy;  Laterality: N/A;  . COLONOSCOPY WITH PROPOFOL N/A 11/18/2018   Procedure: COLONOSCOPY WITH PROPOFOL;  Surgeon: Jerene Bears, MD;  Location: [place];  Service: Gastroenterology;  Laterality: N/A;  . ENTEROSCOPY N/A 12/31/2018   Procedure: ENTEROSCOPY;  Surgeon: Irene Shipper, MD;  Location: [facility] ENDOSCOPY;  Service: Endoscopy;  Laterality: N/A;  Push enteroscopy at 8am (spoke with Endo)  . ESOPHAGOGASTRODUODENOSCOPY N/A 05/11/2015   Procedure: ESOPHAGOGASTRODUODENOSCOPY (EGD);  Surgeon: Laurence Spates, MD;  Location: [facility] ENDOSCOPY;  Service: Endoscopy;  Laterality: N/A;  . ESOPHAGOGASTRODUODENOSCOPY (EGD) WITH PROPOFOL N/A 11/18/2018   Procedure: ESOPHAGOGASTRODUODENOSCOPY (EGD) WITH PROPOFOL;  Surgeon: Jerene Bears, MD;  Location: [facility] ENDOSCOPY;  Service: Gastroenterology;  Laterality: N/A;  . EYE SURGERY Bilateral 2010   Lasik  . FLEXIBLE SIGMOIDOSCOPY N/A 01/17/2017   Procedure: FLEXIBLE SIGMOIDOSCOPY w/ hemorrhoid banding possible;  Surgeon: Silvano Rusk  E, MD;  Location: [place] ENDOSCOPY;  Service: Endoscopy;  Laterality: N/A;  . FLEXIBLE SIGMOIDOSCOPY N/A 01/21/2019   Procedure: FLEXIBLE SIGMOIDOSCOPY;  Surgeon: Ronald Lobo, MD;  Location: [facility] ENDOSCOPY;  Service: Endoscopy;  Laterality: N/A;  Plan to do the test unprepped, unsedated  . FOOT AMPUTATION Left    due diabetic neuropathy, could not feel ulcer on bottom of foot  . GIVENS CAPSULE STUDY N/A 01/21/2019   Procedure: GIVENS CAPSULE STUDY;  Surgeon: Ronald Lobo, MD;  Location: [facility] ENDOSCOPY;  Service: Endoscopy;  Laterality: N/A;  . POLYPECTOMY  11/18/2018   Procedure: POLYPECTOMY;  Surgeon: Jerene Bears, MD;  Location: [facility] ENDOSCOPY;  Service: Gastroenterology;;  . REFRACTIVE SURGERY Bilateral 2013  . TONSILLECTOMY  1970's  . TUBAL LIGATION  2000    Prior to Admission medications   Medication Sig Start  Date End Date Taking? Authorizing Provider  acetaminophen (TYLENOL) 325 MG tablet Take 2 tablets (650 mg total) by mouth every 6 (six) hours as needed for fever, headache or moderate pain. 02/26/19  Yes Emokpae, Courage, MD  albuterol (PROVENTIL) (2.5 MG/3ML) 0.083% nebulizer solution Take 5 mg by nebulization daily as needed for wheezing or shortness of breath.   Yes [provider]  albuterol (VENTOLIN HFA) 108 (90 Base) MCG/ACT inhaler Inhale 2 puffs into the lungs every 4 (four) hours as needed for wheezing or shortness of breath. 01/26/19  Yes Dahal, Marlowe Aschoff, MD  allopurinol (ZYLOPRIM) 100 MG tablet Take 2 tablets (200 mg total) by mouth daily. 02/26/19 04/01/19 Yes Emokpae, Courage, MD  ALPRAZolam Duanne Moron) 0.5 MG tablet Take 0.5 mg by mouth 3 (three) times daily as needed for anxiety. 02/16/19  Yes [provider]  amLODipine (NORVASC) 2.5 MG tablet Take 2.5 mg by mouth daily. 03/16/19  Yes [provider]  atorvastatin (LIPITOR) 40 MG tablet Take 1 tablet (40 mg total) by mouth at bedtime. 02/26/19  Yes Emokpae, Courage, MD  benzonatate (TESSALON) 200 MG capsule Take 1 capsule (200 mg total) by mouth 2 (two) times daily as needed for cough. 03/12/19  Yes Mariel Aloe, MD  Darbepoetin Alfa (ARANESP) 200 MCG/0.4ML SOSY injection Inject 0.4 mLs (200 mcg total) into the vein every Wednesday with hemodialysis. 01/27/19  Yes Dahal, Marlowe Aschoff, MD  diphenhydrAMINE (BENADRYL) 25 mg capsule Take 1 capsule (25 mg total) by mouth every 6 (six) hours as needed for up to 8 days for itching. 01/26/19 02/25/28 Yes Dahal, Marlowe Aschoff, MD  DULoxetine (CYMBALTA) 30 MG capsule Take 2 capsules (60 mg total) by mouth daily. Patient taking differently: Take 30 mg by mouth daily.  03/12/19  Yes Mariel Aloe, MD  hydrocortisone (ANUSOL-HC) 2.5 % rectal cream Place rectally 2 (two) times daily. Patient taking differently: Place 1 application rectally 2 (two) times daily.  01/26/19  Yes Dahal, Marlowe Aschoff, MD   hydrOXYzine (ATARAX/VISTARIL) 25 MG tablet Take 1 tablet (25 mg total) by mouth 3 (three) times daily as needed for itching or anxiety. 02/26/19  Yes Emokpae, Courage, MD  insulin aspart (NOVOLOG) 100 UNIT/ML injection 0-9 Units, Subcutaneous, 3 times daily with meals CBG < 70: implement hypoglycemia protocol CBG 70 - 120: 0 units CBG 121 - 150: 1 unit CBG 151 - 200: 2 units CBG 201 - 250: 3 units CBG 251 - 300: 5 units CBG 301 - 350: 7 units CBG 351 - 400: 9 units CBG > 400: call MD 01/26/19  Yes Dahal, Marlowe Aschoff, MD  insulin glargine (LANTUS) 100 UNIT/ML injection Inject 24 Units into the skin  at bedtime.   Yes [provider]  lidocaine-prilocaine (EMLA) cream Apply 1 application topically See admin instructions. On dialysis days 12/23/18  Yes [provider]  methocarbamol (ROBAXIN) 500 MG tablet Take 500 mg by mouth daily as needed for muscle spasms.   Yes [provider]  mupirocin ointment (BACTROBAN) 2 % Apply 1 application topically as needed (on arm).  12/01/18  Yes [provider]  Oxycodone HCl 10 MG TABS Take 10 mg by mouth 4 (four) times daily. 03/08/19  Yes [provider]  oxymetazoline (AFRIN) 0.05 % nasal spray Place 2 sprays into both nostrils 2 (two) times daily. 01/26/19  Yes Dahal, Marlowe Aschoff, MD  pantoprazole (PROTONIX) 40 MG tablet Take 1 tablet (40 mg total) by mouth daily at 6 (six) AM for 30 days. 01/26/19 04/01/19 Yes Dahal, Marlowe Aschoff, MD  Pramoxine-Menthol-Petrolatum (SARNA ULTRA EX) Apply 1 application topically daily as needed (itch).   Yes [provider]  senna-docusate (SENOKOT-S) 8.6-50 MG tablet Take 2 tablets by mouth at bedtime. 02/26/19 02/26/20 Yes Emokpae, Courage, MD  traZODone (DESYREL) 50 MG tablet Take 0.5 tablets (25 mg total) by mouth at bedtime. 02/26/19 04/01/19 Yes Emokpae, Courage, MD  labetalol (NORMODYNE) 200 MG tablet Take 1 tablet (200 mg total) by mouth 2 (two) times daily. Patient not taking: Reported on 04/01/2019  02/26/19 03/28/19  Roxan Hockey, MD    Scheduled Meds: . allopurinol  200 mg Oral Daily  . atorvastatin  40 mg Oral QHS  . [START ON 04/03/2019] calcitRIOL  1 mcg Oral Q T,Th,Sa-HD  . [START ON 04/03/2019] Chlorhexidine Gluconate Cloth  6 each Topical Q0600  . darbepoetin (ARANESP) injection - DIALYSIS  200 mcg Intravenous Q Thu-HD  . DULoxetine  60 mg Oral Daily  . hydrocortisone   Rectal BID  . insulin aspart  0-9 Units Subcutaneous TID WC  . insulin detemir  10 Units Subcutaneous QHS  . labetalol  200 mg Oral BID  . pantoprazole (PROTONIX) IV  40 mg Intravenous Once  . senna-docusate  2 tablet Oral QHS  . sodium chloride flush  3 mL Intravenous Q12H  . traZODone  25 mg Oral QHS   Infusions:  PRN Meds: acetaminophen **OR** acetaminophen, albuterol, benzonatate, calcium carbonate (dosed in mg elemental calcium), camphor-menthol **AND** hydrOXYzine, docusate sodium, feeding supplement (NEPRO CARB STEADY), oxyCODONE, sorbitol, zolpidem   Allergies as of 04/01/2019 - Review Complete 04/01/2019  Allergen Reaction Noted  . Penicillins Hives and Other (See Comments) 09/30/2014  . Gabapentin Itching and Swelling 02/09/2013  . Lyrica [pregabalin] Swelling and Other (See Comments) 09/30/2014  . Saphris [asenapine] Swelling and Other (See Comments) 09/30/2014  . Tramadol Swelling and Other (See Comments) 09/30/2014  . Adhesive [tape] Itching 09/09/2017  . Latex Itching 09/09/2017  . Other Other (See Comments) 05/01/2015    Family History  Problem Relation Age of Onset  . Hypertension Mother   . Diabetes Mother   . Hypertension Father   . Diabetes Father     Social History   Socioeconomic History  . Marital status: Single    Spouse name: Not on file  . Number of children: 3  . Years of education: 67  . Highest education level: 12th grade  Occupational History  . Occupation: disabled  Social Needs  . Financial resource strain: Not on file  . Food insecurity    Worry:  Not on file    Inability: Not on file  . Transportation needs    Medical: Not on file  Non-medical: Not on file  Tobacco Use  . Smoking status: Current Some Day Smoker    Packs/day: 1.00    Years: 36.00    Pack years: 36.00    Types: Cigarettes  . Smokeless tobacco: Never Used  . Tobacco comment: 5 cigs in one setting  Substance and Sexual Activity  . Alcohol use: Yes    Alcohol/week: 3.0 standard drinks    Types: 3 Standard drinks or equivalent per week    Frequency: Never    Comment: last use about 2 weeks ago  . Drug use: Not Currently    Types: "Crack" cocaine, Cocaine    Comment: last use >2 years ago  . Sexual activity: Not Currently    Birth control/protection: Abstinence  Lifestyle  . Physical activity    Days per week: Not on file    Minutes per session: Not on file  . Stress: Not on file  Relationships  . Social Herbalist on phone: Not on file    Gets together: Not on file    Attends religious service: Not on file    Active member of club or organization: Not on file    Attends meetings of clubs or organizations: Not on file    Relationship status: Not on file  . Intimate partner violence    Fear of current or ex partner: Not on file    Emotionally abused: Not on file    Physically abused: Not on file    Forced sexual activity: Not on file  Other Topics Concern  . Not on file  Social History Narrative   ** Merged History Encounter **   Single, lives with son in a one story home. Unable to exercise. Avoids caffeine. 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        REVIEW OF SYSTEMS: Constitutional: Feels a bit tired and weak. ENT:  No nose bleeds Pulm: No shortness of breath or cough. CV:  No palpitations, no LE edema.  No chest pain. GU:  No hematuria, no frequency GI: See HPI.  Occasional dysphagia to dry foods. Heme: The only unusual or excessive bleeding she has is rectal bleeding.  Transfusions: Per HPI. Neuro:  No headaches, no peripheral tingling or numbness.  No dizziness.  No syncope, no seizures. Derm: For several months she has had pruritic eruptions on her arms and back.  She was being seen by the dermatologist yesterday for these but the appointment was cut short because of her dizziness, apparently it was rescheduled. Endocrine:  No sweats or chills.  No hair loss. Immunization: Not queried. Travel:  None beyond local counties in last few months.    PHYSICAL EXAM: Vital signs in last 24 hours: Vitals:   04/02/19 0551 04/02/19 0900  BP: (!) 175/88 (!) 178/82  Pulse: 80 84  Resp: 19 18  Temp: 98.5 F (36.9 C) 98.6 F (37 C)  SpO2: 98% 100%   Wt Readings from Last 3 Encounters:  04/01/19 69.4 kg  03/12/19 69.2 kg  02/26/19 72.5 kg    General: Alert, animated, pleasant, chronically unwell looking AAF.  Currently comfortable. Head: No facial asymmetry or swelling.  No signs of head trauma. Eyes: No scleral icterus.  No conjunctival pallor.  EOMI. Ears: Not hard of hearing Nose: No congestion or discharge. Mouth: Moist, clear, pink oral mucosa.  Tongue midline.  Seeing teeth. Neck: No JVD, no masses, no thyromegaly. Lungs: No labored breathing.  No cough.  Lungs clear bilaterally. Heart: RRR.  No MRG.  S1, S2 present. Abdomen: Soft.  Minor tenderness in the left mid to lower quadrant.  No HSM, masses, bruits, hernias..   Rectal: Deferred.  RN reports acid of red blood within the last 30 minutes. Musc/Skeltl: No joint redness, swelling.  S/p left midfoot amputation. Extremities: No CCE. Neurologic: Fully alert and oriented.  Moves all 4 limbs without gross deficits.  Strength not tested.  No tremors. Skin: Multiple raised lesions on her arms and back.  These are in various stages of healing. Nodes: No cervical adenopathy. Psych: Calm, pleasant, cooperative.  Intake/Output from previous day: 08/27 0701 - 08/28 0700 In: 1130 [P.O.:300; Blood:830]  Out: 500  Intake/Output this shift: Total I/O In: 723 [P.O.:720; I.V.:3] Out: 0   LAB RESULTS: Recent Labs    04/01/19 1145 04/02/19 0335 04/02/19 1227  WBC 3.4* 4.0  --   HGB 4.8* 7.4* 7.3*  HCT 15.5* 23.1* 23.1*  PLT 102* 121*  --    BMET Lab Results  Component Value Date   NA 137 04/02/2019   NA 136 04/01/2019   NA 137 03/12/2019   K 4.1 04/02/2019   K 6.0 (H) 04/01/2019   K 4.2 03/12/2019   CL 96 (L) 04/02/2019   CL 93 (L) 04/01/2019   CL 96 (L) 03/12/2019   CO2 28 04/02/2019   CO2 22 04/01/2019   CO2 22 03/12/2019   GLUCOSE 205 (H) 04/02/2019   GLUCOSE 303 (H) 04/01/2019   GLUCOSE 187 (H) 03/12/2019   BUN 36 (H) 04/02/2019   BUN 111 (H) 04/01/2019   BUN 64 (H) 03/12/2019   CREATININE 3.47 (H) 04/02/2019   CREATININE 7.74 (H) 04/01/2019   CREATININE 8.42 (H) 03/12/2019   CALCIUM 8.5 (L) 04/02/2019   CALCIUM 8.9 04/01/2019   CALCIUM 8.9 03/12/2019   LFT Recent Labs    04/01/19 1145  PROT 6.4*  ALBUMIN 3.3*  AST 49*  ALT 47*  ALKPHOS 106  BILITOT 0.8   PT/INR Lab Results  Component Value Date   INR 1.5 (H) 03/10/2019   INR 1.3 (H) 12/29/2018   INR 1.2 11/17/2018   PROTIME 17.3 (A) 12/23/2015   PROTIME 15.9 (A) 12/23/2015   Hepatitis Panel No results for input(s): HEPBSAG, HCVAB, HEPAIGM, HEPBIGM in the last 72 hours. C-Diff No components found for: CDIFF Lipase     Component Value Date/Time   LIPASE 58 (H) 11/17/2018 1016    Drugs of Abuse     Component Value Date/Time   LABOPIA NONE DETECTED 01/01/2017 0024   COCAINSCRNUR POSITIVE (A) 01/01/2017 0024   LABBENZ NONE DETECTED 01/01/2017 0024   AMPHETMU NONE DETECTED 01/01/2017 0024   THCU NONE DETECTED 01/01/2017 0024   LABBARB NONE DETECTED 01/01/2017 0024     RADIOLOGY STUDIES: Dg Chest Port 1 View  Result Date: 04/01/2019 CLINICAL DATA:  Chest pain, shortness of breath. EXAM: PORTABLE CHEST 1 VIEW COMPARISON:  Radiographs of February 24, 2019. FINDINGS: Stable cardiomegaly. No  pneumothorax or pleural effusion is noted. No acute pulmonary disease is noted. Bony thorax is unremarkable. IMPRESSION: No active disease. Electronically Signed   By: Marijo Conception M.D.   On: 04/01/2019 11:57     IMPRESSION:   *     Recurrent GI bleeding of unclear etiology. Extensive previous work-ups including upper endoscopies, colonoscopy, enteroscopy, capsule endoscopy failed to locate any source for acute blood loss and ABL anemia. Now with bloody and burgundy stool.  ? Diverticular bleed?  *  Acute on chronic anemia.  Appropriate response to PRBC x2, Hgb 4.8 >> 7.3.  Normal iron, TIBC and iron saturation, B12 and Folate.  Ferritin 804.  Regular doses of Mircera, Venofer at HD.    *      ESRD.  Normal HD schedule TTS, schedule altered this week to allow for MD appointment.  Dialyzed Monday, Thursday and will dialyze tomorrow.  *      Frequent hyperkalemia.  *      Hepatitis C.  Viral load 5.5 million 5 weeks ago.  No previous treatment.  Per record review and per patient recall she has never been referred to ID for consideration of antiviral medication. Metavir fibrosis score F3 + F4 01/2019.  Stable minor elevation of transaminases in the 40s.  No current or history of coagulopathy.Marland Kitchen    PLAN:     *    Ordered CT AP with angiogram.  Ordered IR eval manage in case she needs embolization.   Azucena Freed  04/02/2019, 2:13 PM Phone [phone]     Attending Physician Note   I have taken a history, examined the patient and reviewed the chart. I agree with the Advanced Practitioner's note, impression and recommendations.   Recurrent GI bleed, source not defined despite extensive evaluation as outlined in the consult note. Currently with burgundy stools with minimal LLQ discomfort so appears to be LGI bleed. Mild diverticulosis on recent colonoscopy is a potential source. CTA now to attempt to define location.   Acute on chronic anemia.  ESRD on HD.   HCV. Recommend  outpatient ID evaluation.   Lucio Edward, MD [facility] Gastroenterology

## 2019-04-02 NOTE — Progress Notes (Signed)
Patient just had a medium sized, bloody, liquid bowel movement. MD notified and made aware. Will continue to monitor.

## 2019-04-02 NOTE — Progress Notes (Signed)
CRITICAL VALUE ALERT  Critical Value: Hgb: 6.6  Date & Time Notied: 04/02/19 1648  Provider Notified: Jamesetta Geralds, MD   Orders Received/Actions taken: MD gave verbal order (per previous note) to transfuse 1 unit PRBC's. Orders placed and followed. Will continue to monitor.

## 2019-04-02 NOTE — Progress Notes (Signed)
Patient had a third bloody BM. MD notified of this and I informed MD that 1 unit PRBC is currently infusing. Patient is complaining of weakness. Education provided to patient on importance of not exerting herself since she is feeling weak. Instructed patient to stay in the bed and call for help if needed for her safety. Patient stated that she understood. Will continue to monitor.

## 2019-04-02 NOTE — Progress Notes (Addendum)
Inpatient Diabetes Program Recommendations  AACE/ADA: New Consensus Statement on Inpatient Glycemic Control (2015)  Target Ranges:  Prepandial:   less than 140 mg/dL      Peak postprandial:   less than 180 mg/dL (1-2 hours)      Critically ill patients:  140 - 180 mg/dL   Results for [name], [name] (MRN [id number]) as of 04/02/2019 08:40  Ref. Range 04/01/2019 11:45 04/02/2019 03:35  Glucose Latest Ref Range: 70 - 99 mg/dL 303 (H) 205 (H)   Results for [name], [name] (MRN [id number]) as of 04/02/2019 08:40  Ref. Range 02/25/2019 07:41  Hemoglobin A1C Latest Ref Range: 4.8 - 5.6 % 5.6     Admit with: Symptomatic anemia/ Recurrent GI bleeding  History: DM, ESRD, Polysubstance Abuse, CHF  Home DM Meds: Lantus 24 units QHS (Lantus d/c'd from home meds on 03/12/2019 at time of d/c by MD)       Novolog 0-9 units TID per SSI  Current Orders: None yet     MD- Patient has Diabetes.  No orders for CBG checks or Novolog SSI.  Please consider adding Novolog Sensitive Correction Scale/ SSI (0-9 units) TID AC + HS this AM     --Will follow patient during hospitalization--  Wyn Quaker RN, MSN, CDE Diabetes Coordinator Inpatient Glycemic Control Team Team Pager: [phone] (8a-5p)

## 2019-04-02 NOTE — Progress Notes (Signed)
Patients B/P is elevated and patient is asking for her home dose of labetalol 200 mg PO, however this is not on her medication list. Patient is also requesting a regular diet. MD notified. Orders placed for labetalol 200 mg bid. Orders followed. Will continue to monitor.

## 2019-04-02 NOTE — Progress Notes (Signed)
Patient had another bloody bowel movement, totaling two for today. MD made aware. [name] Geralds, MD at bedside. MD stated that he was going to check another hemoglobin and hematocrit now then every 6 hours. MD gave verbal order that if hemoglobin is below 7, to transfuse 1 unit of PRBC's. Awaiting hemoglobin and hematocrit results. Azucena Freed, PA at bedside as well.

## 2019-04-02 NOTE — Progress Notes (Signed)
PROGRESS NOTE    Patient: [name]                            PCP: Rocco Serene, MD                    DOB: 1965/09/21            DOA: 04/01/2019 [id number]             DOS: 04/02/2019, 12:04 PM   LOS: 1 day   Date of Service: The patient was seen and examined on 04/02/2019  Subjective:   The patient was seen and examined this morning, stable, status post 2 units of PRBC transfusion stating that she is feeling better, got hemodialysis yesterday. Still complaining of some generalized weaknesses, but no shortness of breath or chest pain.  Denies of having any bloody urine or stool.  Denies of having any dark or black stool.  Brief Narrative:   [name] is a 53 y.o. female with medical history significant of ESRD on TTS [name]; HTN; HLD: DM; depression/anxiety/bipolar disorder;  polysubstance abuse; tobacco abuse; and CHF with EF of 45% presenting with chest pain.   She was supposed to be going to the doctor and she was took weak to get to the [place] today.    She has been seeing blood in her stools, thought it was cranberry juice.  She was cramping in her neck and head.  The blood was rushing in her head.  She was last at [name] on Monday, missed Tuesday/Wednesday.  She would like to call her PCP to ask for higher dose pain pills because her pain is poorly controlled.  She hasn't been able to eat well.  No fevers.  She was raised as a Jehovah's Witness but never baptised and so will take blood products.  She has diffuse follicular lesions and was supposed to go to the doctor today.  They are on her left arm, head.  She was last admitted from 8/5-7 for uremia secondary to missed [name] with chronic hematochezia, heme positive, no current etiology, stable.   She was previously admitted from 7/22-24 for SOB and CP associated with missed [name].  She was also admitted from 6/17-23 for GI bleeding; she had flex sig during that hospitalization with no obvious source of bleeding and negative capsule  endoscopy.   ED Course:  Non-compliant with [name], presents with many symptoms.  Feeling weak, fatigued, SOB, CP, black stools.  Hgb 4.8.  Ordered blood - reluctant to agree due to ?Jehovah's Witness.  Nephrology to arrange [name].  [facility] consult pending, had endo/colo in June.    Assessment & Plan:   Principal Problem:   Symptomatic anemia Active Problems:   Chronic combined systolic (congestive) and diastolic (congestive) heart failure (HCC)   Essential hypertension   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 (HCC)   Chronic pain syndrome   Dyslipidemia associated with type 2 diabetes mellitus (HCC)   Cocaine use disorder, severe, dependence ([place])   ESRD on dialysis ([place])   GI bleeding   Symptomatic anemia /Hemoccult positive -Acute on chronic due to comorbidities including ESRD, -Hemoglobin on arrival 4.8--> 7.4 now  -Status post transfusion with 2 units of PRBC, feeling better but still have generalized weaknesses, blood pressure systolic trending up -Currently denies any chest pain or shortness of breath.  -Patient with prior admissions for the same, presenting with what  appears to be symptomatic anemia (in addition to volume overload from [name] noncompliance) -Heme testing was again positive in the ER -Reporting of no active bleeding   Recurrent GI bleeding -Patient has had extensive evaluation without clear cause for her recurrent bleeding -Her bleeding does not appear to be brisk enough to show up on tagged RBC scan, but it may be worth trying this test -Patient reporting of no active bleeding. -Will defer to GI  Recent GI procedures: 4/15 - Colonoscopy - hemorrhoids; 4 mm polyp in ascending colon, removed; 2 sessile polyps in transverse colon, removed; 5 mm polyp in descending colon, sessile, vascular-appearing but not actively bleeding, removed with hot snare; mild melanosis diffuse and few small-mouthed diverticula in the sigmoid colon 4/15 - EGD - mild  gastritis/duodenitis, no active bleeding 5/28 - Small bowel enteroscopy - unremarkable 6/18 - Flex sig - dark stool in rectum and sigmoid colon, minimal burgundy-tinged stool and mucosa without frank blood 01/25/19 - Capsule endoscopy - no active/recent bleeding  -We will continue monitor H&H,    ESRD on [name] -Patient on chronic TTS [name] -Nephrology prn order set utilized -She is in need of acute [name] and was heading to [name] at the time of my evaluation -She needs ongoing encouragement regarding the importance of compliance with [name]  DM -A1c in 7/20 was 5.6, indicating good control -Cover with sensitive-scale SSI  HTN -Continue Labetalol  HLD -Continue Lipitor  Chronic combined CHF -Echo in 6/17 with EF 45-50% and grade 2 diastolic dysfunction -Not markedly overloaded on my exam -Fluid balance with [name]  Chronic pain -I have reviewed this patient in the [place] Controlled Substances Reporting System.  She is receiving medications from multiple providers and is at very high risk of opioid misuse, diversion, or overdose. -She reports that she is planning to call her PCP to get something stronger because her current oxycodone is insufficient for controlling her pain -Will give Oxy IR for now, but given her opiate patterns, would be reluctant to provide escalating doses of medication and would consider outpatient titration off opiates if possible  Cocaine dependence -UDS last performed in 10/17 but consistently positive for cocaine prior  -Will recheck for counseling purposes  Note: This patient has been tested and is negative for the novel coronavirus COVID-19.  DVT prophylaxis:  SCDs Code Status:  Full - confirmed with patient Family Communication: None present Disposition Plan:  Home once clinically improved Consults called: Nephrology; GI  Admission status: Admit -INPATIENT -remains to be in the hospital for continuous monitoring of her H&H, ruling out active bleeding.  Needing  hemodialysis and nephrology consultation.  GI consultation for recurrent GI bleed, Hemoccult positive.     Procedures:   No admission procedures for hospital encounter.   Hemodialysis 04/01/19   Antimicrobials:  Anti-infectives (From admission, onward)   None       Medication:   allopurinol  200 mg Oral Daily   atorvastatin  40 mg Oral QHS   [START ON 04/03/2019] calcitRIOL  1 mcg Oral Q T,Th,Sa-[name]   darbepoetin (ARANESP) injection - DIALYSIS  200 mcg Intravenous Q Thu-[name]   DULoxetine  60 mg Oral Daily   hydrocortisone   Rectal BID   insulin aspart  0-9 Units Subcutaneous TID WC   insulin detemir  10 Units Subcutaneous QHS   labetalol  200 mg Oral BID   pantoprazole (PROTONIX) IV  40 mg Intravenous Once   senna-docusate  2 tablet Oral QHS   sodium chloride flush  3 mL Intravenous Q12H   traZODone  25 mg Oral QHS    acetaminophen **OR** acetaminophen, albuterol, benzonatate, calcium carbonate (dosed in mg elemental calcium), camphor-menthol **AND** hydrOXYzine, docusate sodium, feeding supplement (NEPRO CARB STEADY), oxyCODONE, sorbitol, zolpidem   Objective:   Vitals:   04/01/19 1908 04/01/19 2031 04/02/19 0551 04/02/19 0900  BP: (!) 189/95 (!) 189/91 (!) 175/88 (!) 178/82  Pulse: 90 88 80 84  Resp: (!) 22 18 19 18   Temp: 97.9 F (36.6 C) 98.7 F (37.1 C) 98.5 F (36.9 C) 98.6 F (37 C)  TempSrc: Oral Oral Oral Oral  SpO2: 100% 96% 98% 100%  Weight:  69.4 kg    Height:  5\' 4"  (1.626 m)      Intake/Output Summary (Last 24 hours) at 04/02/2019 1204 Last data filed at 04/02/2019 1157 Gross per 24 hour  Intake 1853 ml  Output 500 ml  Net 1353 ml   Filed Weights   04/01/19 1128 04/01/19 2031  Weight: 69.9 kg 69.4 kg     Examination:   Physical Exam  Constitution:  Alert, cooperative, no distress,  Appears calm and comfortable  Psychiatric: Normal and stable mood and affect, cognition intact,   HEENT: Normocephalic, PERRL, otherwise with  in Normal limits  Chest:Chest symmetric Cardio vascular:  S1/S2, RRR, No murmure, No Rubs or Gallops  pulmonary: Clear to auscultation bilaterally, respirations unlabored, negative wheezes / crackles Abdomen: Soft, non-tender, non-distended, bowel sounds,no masses, no organomegaly Muscular skeletal: Limited exam - in bed, able to move all 4 extremities, Normal strength,  Neuro: CNII-XII intact. , normal motor and sensation, reflexes intact  Extremities: No pitting edema lower extremities, +2 pulses  Skin: Dry, warm to touch, negative for any Rashes, No open wounds Wounds: per nursing documentation  LABs:  CBC Latest Ref Rng & Units 04/02/2019 04/01/2019 03/12/2019  WBC 4.0 - 10.5 K/uL 4.0 3.4(L) 3.3(L)  Hemoglobin 12.0 - 15.0 g/dL 7.4(L) 4.8(LL) 8.0(L)  Hematocrit 36.0 - 46.0 % 23.1(L) 15.5(L) 26.4(L)  Platelets 150 - 400 K/uL 121(L) 102(L) 121(L)   CMP Latest Ref Rng & Units 04/02/2019 04/01/2019 03/12/2019  Glucose 70 - 99 mg/dL 205(H) 303(H) 187(H)  BUN 6 - 20 mg/dL 36(H) 111(H) 64(H)  Creatinine 0.44 - 1.00 mg/dL 3.47(H) 7.74(H) 8.42(H)  Sodium 135 - 145 mmol/L 137 136 137  Potassium 3.5 - 5.1 mmol/L 4.1 6.0(H) 4.2  Chloride 98 - 111 mmol/L 96(L) 93(L) 96(L)  CO2 22 - 32 mmol/L 28 22 22   Calcium 8.9 - 10.3 mg/dL 8.5(L) 8.9 8.9  Total Protein 6.5 - 8.1 g/dL - 6.4(L) -  Total Bilirubin 0.3 - 1.2 mg/dL - 0.8 -  Alkaline Phos 38 - 126 U/L - 106 -  AST 15 - 41 U/L - 49(H) -  ALT 0 - 44 U/L - 47(H) -        SIGNED: Deatra James, MD, FACP, FHM. [facility],  Pager [phone]  If 7PM-7AM, please contact night-coverage [email or web address], Password [facility] 04/02/2019, 12:04 PM   .sasp

## 2019-04-02 NOTE — Progress Notes (Addendum)
[facility] Progress Note   Subjective:   Seen and examined at bedside.  Feeling much better.  HD tolerated well.  Denies SOB, CP, dizziness, n/v/d.  Still feeling a little weak.    Objective Vitals:   04/01/19 1908 04/01/19 2031 04/02/19 0551 04/02/19 0900  BP: (!) 189/95 (!) 189/91 (!) 175/88 (!) 178/82  Pulse: 90 88 80 84  Resp: (!) 22 18 19 18   Temp: 97.9 F (36.6 C) 98.7 F (37.1 C) 98.5 F (36.9 C) 98.6 F (37 C)  TempSrc: Oral Oral Oral Oral  SpO2: 100% 96% 98% 100%  Weight:  69.4 kg    Height:  5\' 4"  (1.626 m)     Physical Exam General:NAD, pleasant female, sitting up in bed Heart:RRR Lungs:CTAB Abdomen:soft, NTND Extremities:no LE edema Dialysis Access: L AVG +b   Filed Weights   04/01/19 1128 04/01/19 2031  Weight: 69.9 kg 69.4 kg    Intake/Output Summary (Last 24 hours) at 04/02/2019 1221 Last data filed at 04/02/2019 1212 Gross per 24 hour  Intake 1853 ml  Output 500 ml  Net 1353 ml    Additional Objective Labs: Basic Metabolic Panel: Recent Labs  Lab 04/01/19 1145 04/02/19 0335  NA 136 137  K 6.0* 4.1  CL 93* 96*  CO2 22 28  GLUCOSE 303* 205*  BUN 111* 36*  CREATININE 7.74* 3.47*  CALCIUM 8.9 8.5*   Liver Function Tests: Recent Labs  Lab 04/01/19 1145  AST 49*  ALT 47*  ALKPHOS 106  BILITOT 0.8  PROT 6.4*  ALBUMIN 3.3*   CBC: Recent Labs  Lab 04/01/19 1145 04/02/19 0335  WBC 3.4* 4.0  HGB 4.8* 7.4*  HCT 15.5* 23.1*  MCV 108.4* 100.0  PLT 102* 121*   CBG: Recent Labs  Lab 04/02/19 1129  GLUCAP 218*   Iron Studies:  Recent Labs    04/01/19 2013  IRON 95  TIBC 326  FERRITIN 804*   Lab Results  Component Value Date   INR 1.5 (H) 03/10/2019   INR 1.3 (H) 12/29/2018   INR 1.2 11/17/2018   PROTIME 17.3 (A) 12/23/2015   PROTIME 15.9 (A) 12/23/2015   Studies/Results: Dg Chest Port 1 View  Result Date: 04/01/2019 CLINICAL DATA:  Chest pain, shortness of breath. EXAM: PORTABLE CHEST 1 VIEW  COMPARISON:  Radiographs of February 24, 2019. FINDINGS: Stable cardiomegaly. No pneumothorax or pleural effusion is noted. No acute pulmonary disease is noted. Bony thorax is unremarkable. IMPRESSION: No active disease. Electronically Signed   By: Marijo Conception M.D.   On: 04/01/2019 11:57    Medications:  . allopurinol  200 mg Oral Daily  . atorvastatin  40 mg Oral QHS  . [START ON 04/03/2019] calcitRIOL  1 mcg Oral Q T,Th,Sa-HD  . darbepoetin (ARANESP) injection - DIALYSIS  200 mcg Intravenous Q Thu-HD  . DULoxetine  60 mg Oral Daily  . hydrocortisone   Rectal BID  . insulin aspart  0-9 Units Subcutaneous TID WC  . insulin detemir  10 Units Subcutaneous QHS  . labetalol  200 mg Oral BID  . pantoprazole (PROTONIX) IV  40 mg Intravenous Once  . senna-docusate  2 tablet Oral QHS  . sodium chloride flush  3 mL Intravenous Q12H  . traZODone  25 mg Oral QHS    Dialysis Orders: TTS at [facility] 3:30hr, 300/600, EDW 71kg, 2K/2Ca, AVG, no heparin - Mircera 238mcg IV q 2 weeks (last given 8/11) - Venofer 50mg  IV weekly (just finished course  of 1g Venofer - tsat 34% on 8/22) - Calcitriol 2.19mcg PO q HD  Assessment/Plan: 1. GIB/symptomatic anemia: +FOBT, Hgb 4.8 on admit, now 7.4 s/p 1 unit pRBC.  Agreed to additional 1 unit pRBC tomorrow with HD.  Given max ESA w/HD yesterday.  Just finished Fe course.  2. Hyperkalemia - resolved post HD.  3. ESRD - HD TTS. K 4.1.  Orders written for HD tomorrow.  Primary would like to dialyze here again before d/c 4. Secondary hyperparathyroidism - Ca at goal. Check phos. Continue VDRA, binders.  5. HTN/volume - BP elevated.  Added home labetalol. Does not appear volume overloaded, titrate down as tolerated.  6. Nutrition - Renal diet once advanced.   [place] to d/c from renal standpoint.   Jen Mow, PA-C [facility] Pager: [phone] 04/02/2019,12:21 PM  LOS: 1 day

## 2019-04-02 NOTE — Plan of Care (Signed)
  Problem: Coping: Goal: Level of anxiety will decrease Outcome: Progressing   Problem: Pain Managment: Goal: General experience of comfort will improve Outcome: Progressing   

## 2019-04-03 LAB — BASIC METABOLIC PANEL
Anion gap: 12 (ref 5–15)
BUN: 57 mg/dL — ABNORMAL HIGH (ref 6–20)
CO2: 27 mmol/L (ref 22–32)
Calcium: 8.4 mg/dL — ABNORMAL LOW (ref 8.9–10.3)
Chloride: 93 mmol/L — ABNORMAL LOW (ref 98–111)
Creatinine, Ser: 4.92 mg/dL — ABNORMAL HIGH (ref 0.44–1.00)
GFR calc Af Amer: 11 mL/min — ABNORMAL LOW (ref >60)
GFR calc non Af Amer: 9 mL/min — ABNORMAL LOW (ref >60)
Glucose, Bld: 182 mg/dL — ABNORMAL HIGH (ref 70–99)
Potassium: 5.7 mmol/L — ABNORMAL HIGH (ref 3.5–5.1)
Sodium: 132 mmol/L — ABNORMAL LOW (ref 135–145)

## 2019-04-03 LAB — CBC
HCT: 19.2 % — ABNORMAL LOW (ref 36.0–46.0)
Hemoglobin: 6.4 g/dL — CL (ref 12.0–15.0)
MCH: 32.7 pg (ref 26.0–34.0)
MCHC: 33.3 g/dL (ref 30.0–36.0)
MCV: 98 fL (ref 80.0–100.0)
Platelets: 99 10*3/uL — ABNORMAL LOW (ref 150–400)
RBC: 1.96 MIL/uL — ABNORMAL LOW (ref 3.87–5.11)
RDW: 18.7 % — ABNORMAL HIGH (ref 11.5–15.5)
WBC: 3.4 10*3/uL — ABNORMAL LOW (ref 4.0–10.5)
nRBC: 0 % (ref 0.0–0.2)

## 2019-04-03 LAB — HEMOGLOBIN AND HEMATOCRIT, BLOOD
HCT: 24.7 % — ABNORMAL LOW (ref 36.0–46.0)
Hemoglobin: 8.3 g/dL — ABNORMAL LOW (ref 12.0–15.0)

## 2019-04-03 LAB — GLUCOSE, CAPILLARY: Glucose-Capillary: 89 mg/dL (ref 70–99)

## 2019-04-03 LAB — PREPARE RBC (CROSSMATCH)

## 2019-04-03 MED ORDER — LIDOCAINE HCL (PF) 1 % IJ SOLN: 5.0000 mL | INTRAMUSCULAR | Status: DC | PRN

## 2019-04-03 MED ORDER — ALTEPLASE 2 MG IJ SOLR: 2.0000 mg | Freq: Once | INTRAMUSCULAR | Status: DC | PRN

## 2019-04-03 MED ORDER — OXYCODONE HCL 10 MG PO TABS: 10.0000 mg | ORAL_TABLET | Freq: Four times a day (QID) | ORAL | 0 refills | Status: DC

## 2019-04-03 MED ORDER — SODIUM CHLORIDE 0.9 % IV SOLN: 100.0000 mL | INTRAVENOUS | Status: DC | PRN

## 2019-04-03 MED ORDER — PENTAFLUOROPROP-TETRAFLUOROETH EX AERO: 1.0000 "application " | INHALATION_SPRAY | CUTANEOUS | Status: DC | PRN

## 2019-04-03 MED ORDER — CALCITRIOL 0.5 MCG PO CAPS
ORAL_CAPSULE | ORAL | Status: AC
  Administered 2019-04-03: 1 ug via ORAL
  Filled 2019-04-03: qty 2

## 2019-04-03 MED ORDER — ACETAMINOPHEN 325 MG PO TABS
ORAL_TABLET | ORAL | Status: AC
  Administered 2019-04-03: 650 mg via ORAL
  Filled 2019-04-03: qty 2

## 2019-04-03 MED ORDER — LIDOCAINE-PRILOCAINE 2.5-2.5 % EX CREA: 1.0000 "application " | TOPICAL_CREAM | CUTANEOUS | Status: DC | PRN

## 2019-04-03 MED ORDER — MUPIROCIN 2 % EX OINT: 1.0000 "application " | TOPICAL_OINTMENT | CUTANEOUS | 2 refills | Status: AC | PRN

## 2019-04-03 MED ORDER — HYDROCORTISONE 2.5 % RE CREA: 1.0000 "application " | TOPICAL_CREAM | Freq: Two times a day (BID) | RECTAL | 1 refills | Status: AC

## 2019-04-03 MED ORDER — HEPARIN SODIUM (PORCINE) 1000 UNIT/ML DIALYSIS: 1000.0000 [IU] | INTRAMUSCULAR | Status: DC | PRN

## 2019-04-03 MED ORDER — SODIUM CHLORIDE 0.9% IV SOLUTION
Freq: Once | INTRAVENOUS | Status: AC
  Administered 2019-04-03: 05:00:00 via INTRAVENOUS

## 2019-04-03 MED ORDER — OXYCODONE HCL 5 MG PO TABS
ORAL_TABLET | ORAL | Status: AC
  Filled 2019-04-03: qty 2

## 2019-04-03 NOTE — Progress Notes (Signed)
CRITICAL VALUE ALERT  Critical Value:  hgb 6.4   Date & Time Notied:  04/03/2019 @ 2263   Provider Notified: Silas [place], NP  Orders Received/Actions taken: Transfuse 1 unit RBC

## 2019-04-03 NOTE — Plan of Care (Signed)
  Problem: Education: Goal: Knowledge of General Education information will improve Description: Including pain rating scale, medication(s)/side effects and non-pharmacologic comfort measures Outcome: Progressing   Problem: Clinical Measurements: Goal: Ability to maintain clinical measurements within normal limits will improve Outcome: Progressing   Problem: Clinical Measurements: Goal: Diagnostic test results will improve Outcome: Progressing   Problem: Nutrition: Goal: Adequate nutrition will be maintained Outcome: Progressing   

## 2019-04-03 NOTE — Procedures (Signed)
I was present at this dialysis session. I have reviewed the session itself and made appropriate changes.   Hb 6.5 rec 1u PRBC. CTA yesterday did not identify bleeding source. GI following.    2K bath, UF goal 4L, BP stable.  AVG. No heparin  Filed Weights   04/01/19 1128 04/01/19 2031 04/02/19 2014  Weight: 69.9 kg 69.4 kg 72.8 kg    Recent Labs  Lab 04/03/19 0341  NA 132*  K 5.7*  CL 93*  CO2 27  GLUCOSE 182*  BUN 57*  CREATININE 4.92*  CALCIUM 8.4*    Recent Labs  Lab 04/01/19 1145 04/02/19 0335  04/02/19 1620 04/02/19 2226 04/03/19 0341  WBC 3.4* 4.0  --   --   --  3.4*  HGB 4.8* 7.4*   < > 6.6* 7.0* 6.4*  HCT 15.5* 23.1*   < > 20.6* 20.8* 19.2*  MCV 108.4* 100.0  --   --   --  98.0  PLT 102* 121*  --   --   --  99*   < > = values in this interval not displayed.    Scheduled Meds: . sodium chloride   Intravenous Once  . allopurinol  200 mg Oral Daily  . atorvastatin  40 mg Oral QHS  . calcitRIOL  1 mcg Oral Q T,Th,Sa-HD  . Chlorhexidine Gluconate Cloth  6 each Topical Q0600  . darbepoetin (ARANESP) injection - DIALYSIS  200 mcg Intravenous Q Thu-HD  . DULoxetine  60 mg Oral Daily  . hydrocortisone   Rectal BID  . hydrocortisone cream   Topical QID  . insulin aspart  0-9 Units Subcutaneous TID WC  . insulin detemir  10 Units Subcutaneous QHS  . labetalol  200 mg Oral BID  . neomycin-bacitracin-polymyxin   Topical QID  . pantoprazole (PROTONIX) IV  40 mg Intravenous Once  . senna-docusate  2 tablet Oral QHS  . sodium chloride flush  3 mL Intravenous Q12H  . traZODone  25 mg Oral QHS   Continuous Infusions: . sodium chloride    . sodium chloride     PRN Meds:.sodium chloride, sodium chloride, acetaminophen **OR** acetaminophen, albuterol, alteplase, benzonatate, calcium carbonate (dosed in mg elemental calcium), camphor-menthol **AND** hydrOXYzine, docusate sodium, feeding supplement (NEPRO CARB STEADY), heparin, lidocaine (PF), lidocaine-prilocaine,  oxyCODONE, pentafluoroprop-tetrafluoroeth, sorbitol, zolpidem   Pearson Grippe  MD 04/03/2019, 8:08 AM

## 2019-04-03 NOTE — Progress Notes (Addendum)
DISCHARGE NOTE  [name] to be discharged Home per MD order. Patient verbalized understanding.  Skin clean, dry and intact without evidence of skin break down, no evidence of skin tears noted. IV catheter discontinued intact. Site without signs and symptoms of complications. Dressing and pressure applied. Pt denies pain at the site currently. No complaints noted.  Patient free of lines, drains, and wounds.   Discharge packet assembled. An After Visit Summary (AVS) was printed and given to the patient at bedside. Patient escorted via wheelchair  and discharged to designated family member via private transportation. All questions and concerns addressed.   Dolores Hoose, RN

## 2019-04-03 NOTE — Plan of Care (Signed)
  Problem: Education: Goal: Knowledge of General Education information will improve Description: Including pain rating scale, medication(s)/side effects and non-pharmacologic comfort measures 04/03/2019 1621 by Dolores Hoose, RN Outcome: Adequate for Discharge 04/03/2019 0812 by Dolores Hoose, RN Outcome: Progressing   Problem: Health Behavior/Discharge Planning: Goal: Ability to manage health-related needs will improve Outcome: Adequate for Discharge   Problem: Clinical Measurements: Goal: Ability to maintain clinical measurements within normal limits will improve 04/03/2019 1621 by Dolores Hoose, RN Outcome: Adequate for Discharge 04/03/2019 0812 by Dolores Hoose, RN Outcome: Progressing Goal: Will remain free from infection Outcome: Adequate for Discharge Goal: Diagnostic test results will improve 04/03/2019 1621 by Dolores Hoose, RN Outcome: Adequate for Discharge 04/03/2019 3202 by Dolores Hoose, RN Outcome: Progressing Goal: Respiratory complications will improve Outcome: Adequate for Discharge Goal: Cardiovascular complication will be avoided Outcome: Adequate for Discharge   Problem: Nutrition: Goal: Adequate nutrition will be maintained 04/03/2019 1621 by Dolores Hoose, RN Outcome: Adequate for Discharge 04/03/2019 3343 by Dolores Hoose, RN Outcome: Progressing   Problem: Coping: Goal: Level of anxiety will decrease Outcome: Adequate for Discharge   Problem: Elimination: Goal: Will not experience complications related to bowel motility Outcome: Adequate for Discharge Goal: Will not experience complications related to urinary retention Outcome: Adequate for Discharge   Problem: Pain Managment: Goal: General experience of comfort will improve Outcome: Adequate for Discharge   Problem: Safety: Goal: Ability to remain free from injury will improve Outcome: Adequate for Discharge   Problem: Skin Integrity: Goal: Risk for impaired  skin integrity will decrease Outcome: Adequate for Discharge

## 2019-04-03 NOTE — Care Management (Signed)
Patient active w [facility] for [facility] PTA, resumption order placed and [place] notified of DC today

## 2019-04-03 NOTE — Progress Notes (Deleted)
Physician Discharge Summary  [name] ZJI:[id number] DOB: 1965-12-06 DOA: 04/01/2019  PCP: Rocco Serene, MD  Admit date: 04/01/2019 Discharge date: 04/03/2019  Admitted From: Home  Disposition:  Home   Recommendations for Outpatient Follow-up:  1. Follow up with PCP in 1-2 weeks 2. Follow up with ID for hepatitis C and also to discuss patient's concerns about MRSA decolonization 3. Follow up with GI in 2-4 weeks for rectal bleeding     Home Health: None  Equipment/Devices: None  Discharge Condition: Good  CODE STATUS: FULL Diet recommendation: Renal  Brief/Interim Summary: [name] is a 53 y.o. F with ESRD on HD TThS, HTN, IDDM c/b mid-foot amputation 2014, Bipolar, chronic pain syndrome, chronic hep C, hx TIA and hx s and dCHF who presented with hematochezia, melena.           PRINCIPAL HOSPITAL DIAGNOSIS: Acute blood loss anemia from lower GI bleed    Discharge Diagnoses:   Acute blood loss anemia Acute lower GI bleed Admitted and had burgundy stool here.  Got 2 units PRBC transfusion night of admission.    Subsequently, GI were consulted and evaluated patient.  CTA abdomen showed no source of bleeding.  She had a brown bowel movement, no further clinical bleeding.  She was transfused a 3rd unit of blood, and post transfusion her H/H increased appropriately.  I discussed the case with Dr. Fuller Plan who felt no further interventions necessary during this hospitalization, as the bleeding had stopped.    GI follow up was arranged.   ESRD  Hypertension  Diabetes  Chronic systolic and diastolic CHF   Hepatitis C -Referral to ID  Rash She has multiple small punched out ulcers on the arms, scalp.  These are pruritic.  She believes they are MRSA and is convinced she needs a MRSA eradication regimen.  She was directed to resources online about MRSA de-colonization regimens for the home and also recommended to discuss this with her  PCP.          Discharge Instructions  Discharge Instructions    Ambulatory referral to Gastroenterology   Complete by: As directed    Please discuss with Dr. Fuller Plan timing of follow up appointment, in 2-4 weeks   Ambulatory referral to Infectious Disease   Complete by: As directed    Patient referral for hepatitis C Also would like education about MRSA de-colonization   Discharge instructions   Complete by: As directed    From Dr. Loleta Books: You were admitted with rectal bleeding.  The CT scan didn't show the source of the bleeding, and it stopped by itself, suggesting it was from hemorrhoids or from diverticuli (these are common spots in the colon that bleed, almost never dangerous)  Use the Anusol for hemorrhoids Use the mupirocin on your skin ulcers  Follow up with your primary care doctor in 1-2 weeks  I have sent referrals to the GI doctors office in a few weeks, you should call them to confirm that they are scheduling you an appointment.  I have also sent a referral to the infectious disease doctors ([facility] Infectious [facility]), call them also to confirm they are scheduling you.   Increase activity slowly   Complete by: As directed      Allergies as of 04/03/2019      Reactions   Penicillins Hives, Other (See Comments)   Has patient had a PCN reaction causing immediate rash, facial/tongue/throat swelling, SOB or lightheadedness with hypotension: Yes Has patient  had a PCN reaction causing severe rash involving mucus membranes or skin necrosis: No Has patient had a PCN reaction that required hospitalization No Has patient had a PCN reaction occurring within the last 10 years: No If all of the above answers are "NO", then may proceed with Cephalosporin use. Pt tolerates cefepime and ceftriaxone   Gabapentin Itching, Swelling   Lyrica [pregabalin] Swelling, Other (See Comments)   Facial and leg swelling   Saphris [asenapine] Swelling, Other (See  Comments)   Facial swelling   Tramadol Swelling, Other (See Comments)   Leg swelling   Adhesive [tape] Itching   SEVERE ITCHING   Latex Itching   SEVERE ITCHING   Other Other (See Comments)   NO BLOOD PRODUCTS; PATIENT IS A JEHOVAH'S WITNESS      Medication List    TAKE these medications   acetaminophen 325 MG tablet Commonly known as: TYLENOL Take 2 tablets (650 mg total) by mouth every 6 (six) hours as needed for fever, headache or moderate pain.   albuterol (2.5 MG/3ML) 0.083% nebulizer solution Commonly known as: PROVENTIL Take 5 mg by nebulization daily as needed for wheezing or shortness of breath.   albuterol 108 (90 Base) MCG/ACT inhaler Commonly known as: VENTOLIN HFA Inhale 2 puffs into the lungs every 4 (four) hours as needed for wheezing or shortness of breath.   allopurinol 100 MG tablet Commonly known as: ZYLOPRIM Take 2 tablets (200 mg total) by mouth daily.   ALPRAZolam 0.5 MG tablet Commonly known as: XANAX Take 0.5 mg by mouth 3 (three) times daily as needed for anxiety.   amLODipine 2.5 MG tablet Commonly known as: NORVASC Take 2.5 mg by mouth daily.   atorvastatin 40 MG tablet Commonly known as: LIPITOR Take 1 tablet (40 mg total) by mouth at bedtime.   benzonatate 200 MG capsule Commonly known as: TESSALON Take 1 capsule (200 mg total) by mouth 2 (two) times daily as needed for cough.   Darbepoetin Alfa 200 MCG/0.4ML Sosy injection Commonly known as: ARANESP Inject 0.4 mLs (200 mcg total) into the vein every Wednesday with hemodialysis.   diphenhydrAMINE 25 mg capsule Commonly known as: BENADRYL Take 1 capsule (25 mg total) by mouth every 6 (six) hours as needed for up to 8 days for itching.   DULoxetine 30 MG capsule Commonly known as: CYMBALTA Take 2 capsules (60 mg total) by mouth daily. What changed: how much to take   hydrocortisone 2.5 % rectal cream Commonly known as: ANUSOL-HC Place 1 application rectally 2 (two) times  daily.   hydrOXYzine 25 MG tablet Commonly known as: ATARAX/VISTARIL Take 1 tablet (25 mg total) by mouth 3 (three) times daily as needed for itching or anxiety.   insulin aspart 100 UNIT/ML injection Commonly known as: novoLOG 0-9 Units, Subcutaneous, 3 times daily with meals CBG < 70: implement hypoglycemia protocol CBG 70 - 120: 0 units CBG 121 - 150: 1 unit CBG 151 - 200: 2 units CBG 201 - 250: 3 units CBG 251 - 300: 5 units CBG 301 - 350: 7 units CBG 351 - 400: 9 units CBG > 400: call MD   insulin glargine 100 UNIT/ML injection Commonly known as: LANTUS Inject 24 Units into the skin at bedtime.   labetalol 200 MG tablet Commonly known as: NORMODYNE Take 1 tablet (200 mg total) by mouth 2 (two) times daily.   lidocaine-prilocaine cream Commonly known as: EMLA Apply 1 application topically See admin instructions. On dialysis days   methocarbamol 500  MG tablet Commonly known as: ROBAXIN Take 500 mg by mouth daily as needed for muscle spasms.   mupirocin ointment 2 % Commonly known as: BACTROBAN Apply 1 application topically as needed (on arm).   Oxycodone HCl 10 MG Tabs Take 1 tablet (10 mg total) by mouth 4 (four) times daily.   oxymetazoline 0.05 % nasal spray Commonly known as: AFRIN Place 2 sprays into both nostrils 2 (two) times daily.   pantoprazole 40 MG tablet Commonly known as: PROTONIX Take 1 tablet (40 mg total) by mouth daily at 6 (six) AM for 30 days.   SARNA ULTRA EX Apply 1 application topically daily as needed (itch).   senna-docusate 8.6-50 MG tablet Commonly known as: Senokot-S Take 2 tablets by mouth at bedtime.   traZODone 50 MG tablet Commonly known as: DESYREL Take 0.5 tablets (25 mg total) by mouth at bedtime.      Follow-up Information    [facility] Follow up.   Why: physical therapy       [facility]              Follow up.   Why: Call to follow up for hepatitis and MRSA Contact  information: [address] [place] [address]       [facility] Follow up.   Specialty: Gastroenterology Why: Call to follow up for bleeding Contact information: [place] [address] [phone]         Allergies  Allergen Reactions  . Penicillins Hives and Other (See Comments)    Has patient had a PCN reaction causing immediate rash, facial/tongue/throat swelling, SOB or lightheadedness with hypotension: Yes Has patient had a PCN reaction causing severe rash involving mucus membranes or skin necrosis: No Has patient had a PCN reaction that required hospitalization No Has patient had a PCN reaction occurring within the last 10 years: No If all of the above answers are "NO", then may proceed with Cephalosporin use. Pt tolerates cefepime and ceftriaxone  . Gabapentin Itching and Swelling  . Lyrica [Pregabalin] Swelling and Other (See Comments)    Facial and leg swelling  . Saphris [Asenapine] Swelling and Other (See Comments)    Facial swelling  . Tramadol Swelling and Other (See Comments)    Leg swelling  . Adhesive [Tape] Itching    SEVERE ITCHING  . Latex Itching    SEVERE ITCHING  . Other Other (See Comments)    NO BLOOD PRODUCTS; PATIENT IS A JEHOVAH'S WITNESS    Consultations:  GI   Procedures/Studies: Dg Chest Port 1 View  Result Date: 04/01/2019 CLINICAL DATA:  Chest pain, shortness of breath. EXAM: PORTABLE CHEST 1 VIEW COMPARISON:  Radiographs of February 24, 2019. FINDINGS: Stable cardiomegaly. No pneumothorax or pleural effusion is noted. No acute pulmonary disease is noted. Bony thorax is unremarkable. IMPRESSION: No active disease. Electronically Signed   By: Marijo Conception M.D.   On: 04/01/2019 11:57   Ct Angio Abd/pel W/ And/or W/o  Result Date: 04/02/2019 CLINICAL DATA:  53 year old female with GI bleed. EXAM: CTA ABDOMEN AND PELVIS wITHOUT AND WITH CONTRAST TECHNIQUE:  Multidetector CT imaging of the abdomen and pelvis was performed using the standard protocol during bolus administration of intravenous contrast. Multiplanar reconstructed images and MIPs were obtained and reviewed to evaluate the vascular anatomy. CONTRAST:  174mL OMNIPAQUE IOHEXOL 350 MG/ML SOLN COMPARISON:  Pelvis CT of the abdomen pelvis dated 01/22/2019 FINDINGS: VASCULAR Aorta: Normal  caliber aorta without aneurysm, dissection, vasculitis or significant stenosis. Celiac: Patent without evidence of aneurysm, dissection, vasculitis or significant stenosis. SMA: Mild atherosclerotic calcification SMA. The SMA is widely patent. Renals: Both renal arteries are patent without evidence of aneurysm, dissection, vasculitis, fibromuscular dysplasia or significant stenosis. IMA: Patent without evidence of aneurysm, dissection, vasculitis or significant stenosis. Inflow: Patent without evidence of aneurysm, dissection, vasculitis or significant stenosis. Atherosclerotic calcification of the iliac arteries. Proximal Outflow: Moderate atherosclerotic calcification of the visualized femoral arteries. The femoral arteries are patent. Veins: The SMV, splenic vein, and main portal vein are patent. The IVC is unremarkable. No portal venous gas. Review of the MIP images confirms the above findings. NON-VASCULAR Lower chest: There is mild cardiomegaly. There is retrograde flow of contrast from the right atrium into the IVC suggestive of right heart dysfunction. There are bibasilar linear atelectasis/scarring. Small bilateral pleural effusions with associated mild compressive atelectasis of the lower lobes. No intra-abdominal free air.  Small subhepatic free fluid. Hepatobiliary: Morphologic changes of cirrhosis. There is moderate periportal edema there is a small stone in the gallbladder. Small amount of fluid adjacent to the gallbladder may be related to underlying liver disease. Ultrasound may provide better evaluation the  gallbladder if there is high clinical concern for acute cholecystitis. Pancreas: The pancreas is grossly unremarkable as visualized. Spleen: Normal in size without focal abnormality. Adrenals/Urinary Tract: The adrenal glands are unremarkable. Mild bilateral renal parenchyma atrophy. Probable punctate nonobstructing right renal interpolar calculus versus vascular calcification. There is no hydronephrosis on either side. The urinary bladder is collapsed. Stomach/Bowel: There is moderate stool throughout the colon. Evaluation for GI bleed is limited as non-contrast images are not provided. No definite active bleed identified. There is no evidence of bowel obstruction or active inflammation. The appendix is normal. Lymphatic: No definite adenopathy. Top-normal bilateral inguinal lymph nodes, likely reactive. Reproductive: Hysterectomy. No pelvic mass. Other: Mild diffuse subcutaneous edema. No fluid collection. Musculoskeletal: No acute or significant osseous findings. IMPRESSION: 1. No acute intra-abdominal or pelvic pathology. No CT evidence of active GI bleed. 2. Cirrhosis and small subhepatic ascites. 3. Cholelithiasis. Ultrasound may provide better evaluation the gallbladder if there is high clinical concern for acute cholecystitis. 4. Moderate colonic stool burden. No bowel obstruction or active inflammation. Normal appendix. 5. Small bilateral pleural effusions with associated mild compressive atelectasis of the lower lobes. Electronically Signed   By: Anner Crete M.D.   On: 04/02/2019 21:45       Subjective: No more melena, hematochezia, hematemesis.  No vomiting, confusion.  Discharge Exam: Vitals:   04/03/19 1030 04/03/19 1034  BP: (!) 152/78 (!) 154/70  Pulse: 76 74  Resp:  16  Temp:  97.8 F (36.6 C)  SpO2:  96%   Vitals:   04/03/19 0930 04/03/19 1000 04/03/19 1030 04/03/19 1034  BP: (!) 144/69 (!) 153/73 (!) 152/78 (!) 154/70  Pulse: 74 74 76 74  Resp:    16  Temp:    97.8 F  (36.6 C)  TempSrc:    Oral  SpO2:    96%  Weight:    73.3 kg  Height:        General: Pt is alert, awake, not in acute distress Cardiovascular: RRR, nl S1-S2, no murmurs appreciated.   No LE edema.   Respiratory: Normal respiratory rate and rhythm.  CTAB without rales or wheezes. Abdominal: Abdomen soft and non-tender.  No distension or HSM.   Neuro/Psych: Strength symmetric in upper and lower extremities.  Judgment  and insight appear normal.   The results of significant diagnostics from this hospitalization (including imaging, microbiology, ancillary and laboratory) are listed below for reference.     Microbiology: Recent Results (from the past 240 hour(s))  SARS Coronavirus 2 [facility] order, Performed in [facility] hospital lab) Nasopharyngeal Nasopharyngeal Swab     Status: None   Collection Time: 04/01/19  2:58 PM   Specimen: Nasopharyngeal Swab  Result Value Ref Range Status   SARS Coronavirus 2 NEGATIVE NEGATIVE Final    Comment: (NOTE) If result is NEGATIVE SARS-CoV-2 target nucleic acids are NOT DETECTED. The SARS-CoV-2 RNA is generally detectable in upper and lower  respiratory specimens during the acute phase of infection. The lowest  concentration of SARS-CoV-2 viral copies this assay can detect is 250  copies / mL. A negative result does not preclude SARS-CoV-2 infection  and should not be used as the sole basis for treatment or other  patient management decisions.  A negative result may occur with  improper specimen collection / handling, submission of specimen other  than nasopharyngeal swab, presence of viral mutation(s) within the  areas targeted by this assay, and inadequate number of viral copies  (<250 copies / mL). A negative result must be combined with clinical  observations, patient history, and epidemiological information. If result is POSITIVE SARS-CoV-2 target nucleic acids are DETECTED. The SARS-CoV-2 RNA is generally detectable in upper and lower   respiratory specimens dur ing the acute phase of infection.  Positive  results are indicative of active infection with SARS-CoV-2.  Clinical  correlation with patient history and other diagnostic information is  necessary to determine patient infection status.  Positive results do  not rule out bacterial infection or co-infection with other viruses. If result is PRESUMPTIVE POSTIVE SARS-CoV-2 nucleic acids MAY BE PRESENT.   A presumptive positive result was obtained on the submitted specimen  and confirmed on repeat testing.  While 2019 novel coronavirus  (SARS-CoV-2) nucleic acids may be present in the submitted sample  additional confirmatory testing may be necessary for epidemiological  and / or clinical management purposes  to differentiate between  SARS-CoV-2 and other Sarbecovirus currently known to infect humans.  If clinically indicated additional testing with an alternate test  methodology [phone]) is advised. The SARS-CoV-2 RNA is generally  detectable in upper and lower respiratory sp ecimens during the acute  phase of infection. The expected result is Negative. Fact Sheet for Patients:  StrictlyIdeas.no Fact Sheet for Healthcare Providers: BankingDealers.co.za This test is not yet approved or cleared by the [place] FDA and has been authorized for detection and/or diagnosis of SARS-CoV-2 by FDA under an Emergency Use Authorization (EUA).  This EUA will remain in effect (meaning this test can be used) for the duration of the COVID-19 declaration under Section 564(b)(1) of the Act, 21 U.S.C. section 360bbb-3(b)(1), unless the authorization is terminated or revoked sooner. Performed at [facility], [place] [address] [place], [place] [address]      Labs: BNP (last 3 results) No results for input(s): BNP in the last 8760 hours. Basic Metabolic Panel: Recent Labs  Lab 04/01/19 1145 04/02/19 0335 04/03/19 0341   NA 136 137 132*  K 6.0* 4.1 5.7*  CL 93* 96* 93*  CO2 22 28 27   GLUCOSE 303* 205* 182*  BUN 111* 36* 57*  CREATININE 7.74* 3.47* 4.92*  CALCIUM 8.9 8.5* 8.4*   Liver Function Tests: Recent Labs  Lab 04/01/19 1145  AST 49*  ALT 47*  ALKPHOS 106  BILITOT 0.8  PROT 6.4*  ALBUMIN 3.3*   No results for input(s): LIPASE, AMYLASE in the last 168 hours. No results for input(s): AMMONIA in the last 168 hours. CBC: Recent Labs  Lab 04/01/19 1145 04/02/19 0335 04/02/19 1227 04/02/19 1620 04/02/19 2226 04/03/19 0341 04/03/19 1135  WBC 3.4* 4.0  --   --   --  3.4*  --   HGB 4.8* 7.4* 7.3* 6.6* 7.0* 6.4* 8.3*  HCT 15.5* 23.1* 23.1* 20.6* 20.8* 19.2* 24.7*  MCV 108.4* 100.0  --   --   --  98.0  --   PLT 102* 121*  --   --   --  99*  --    Cardiac Enzymes: No results for input(s): CKTOTAL, CKMB, CKMBINDEX, TROPONINI in the last 168 hours. BNP: Invalid input(s): POCBNP CBG: Recent Labs  Lab 04/02/19 1129 04/02/19 1617 04/02/19 2018 04/03/19 1136  GLUCAP 218* 167* 199* 89   D-Dimer No results for input(s): DDIMER in the last 72 hours. Hgb A1c No results for input(s): HGBA1C in the last 72 hours. Lipid Profile No results for input(s): CHOL, HDL, LDLCALC, TRIG, CHOLHDL, LDLDIRECT in the last 72 hours. Thyroid function studies No results for input(s): TSH, T4TOTAL, T3FREE, THYROIDAB in the last 72 hours.  Invalid input(s): FREET3 Anemia work up Recent Labs    04/01/19 2013  VITAMINB12 6,897*  FOLATE 57.9  FERRITIN 804*  TIBC 326  IRON 95  RETICCTPCT 3.6*   Urinalysis    Component Value Date/Time   COLORURINE YELLOW 02/25/2017 1224   APPEARANCEUR HAZY (A) 02/25/2017 1224   LABSPEC 1.013 02/25/2017 1224   PHURINE 8.0 02/25/2017 1224   GLUCOSEU >=500 (A) 02/25/2017 1224   HGBUR SMALL (A) 02/25/2017 1224   BILIRUBINUR NEGATIVE 02/25/2017 1224   BILIRUBINUR neg 12/13/2014 1127   KETONESUR NEGATIVE 02/25/2017 1224   PROTEINUR >=300 (A) 02/25/2017 1224    UROBILINOGEN 0.2 05/01/2015 1252   NITRITE NEGATIVE 02/25/2017 1224   LEUKOCYTESUR NEGATIVE 02/25/2017 1224   Sepsis Labs Invalid input(s): PROCALCITONIN,  WBC,  LACTICIDVEN Microbiology Recent Results (from the past 240 hour(s))  SARS Coronavirus 2 [facility] order, Performed in [place] hospital lab) Nasopharyngeal Nasopharyngeal Swab     Status: None   Collection Time: 04/01/19  2:58 PM   Specimen: Nasopharyngeal Swab  Result Value Ref Range Status   SARS Coronavirus 2 NEGATIVE NEGATIVE Final    Comment: (NOTE) If result is NEGATIVE SARS-CoV-2 target nucleic acids are NOT DETECTED. The SARS-CoV-2 RNA is generally detectable in upper and lower  respiratory specimens during the acute phase of infection. The lowest  concentration of SARS-CoV-2 viral copies this assay can detect is 250  copies / mL. A negative result does not preclude SARS-CoV-2 infection  and should not be used as the sole basis for treatment or other  patient management decisions.  A negative result may occur with  improper specimen collection / handling, submission of specimen other  than nasopharyngeal swab, presence of viral mutation(s) within the  areas targeted by this assay, and inadequate number of viral copies  (<250 copies / mL). A negative result must be combined with clinical  observations, patient history, and epidemiological information. If result is POSITIVE SARS-CoV-2 target nucleic acids are DETECTED. The SARS-CoV-2 RNA is generally detectable in upper and lower  respiratory specimens dur ing the acute phase of infection.  Positive  results are indicative of active infection with SARS-CoV-2.  Clinical  correlation with patient history and other diagnostic  information is  necessary to determine patient infection status.  Positive results do  not rule out bacterial infection or co-infection with other viruses. If result is PRESUMPTIVE POSTIVE SARS-CoV-2 nucleic acids MAY BE PRESENT.   A  presumptive positive result was obtained on the submitted specimen  and confirmed on repeat testing.  While 2019 novel coronavirus  (SARS-CoV-2) nucleic acids may be present in the submitted sample  additional confirmatory testing may be necessary for epidemiological  and / or clinical management purposes  to differentiate between  SARS-CoV-2 and other Sarbecovirus currently known to infect humans.  If clinically indicated additional testing with an alternate test  methodology [phone]) is advised. The SARS-CoV-2 RNA is generally  detectable in upper and lower respiratory sp ecimens during the acute  phase of infection. The expected result is Negative. Fact Sheet for Patients:  StrictlyIdeas.no Fact Sheet for Healthcare Providers: BankingDealers.co.za This test is not yet approved or cleared by the [place] FDA and has been authorized for detection and/or diagnosis of SARS-CoV-2 by FDA under an Emergency Use Authorization (EUA).  This EUA will remain in effect (meaning this test can be used) for the duration of the COVID-19 declaration under Section 564(b)(1) of the Act, 21 U.S.C. section 360bbb-3(b)(1), unless the authorization is terminated or revoked sooner. Performed at [facility], [place] [employer] [address] [place], [place] [address]      Time coordinating discharge: 45 minutes The [place] controlled substances registry was reviewed for this patient prior to filling the <5 days supply controlled substances script.      SIGNED:   Edwin Dada, MD  [facility] 04/03/2019, 4:53 PM

## 2019-04-04 LAB — BPAM RBC
Blood Product Expiration Date: 202009282359
Blood Product Expiration Date: 202009282359
Blood Product Expiration Date: 202010032359
Blood Product Expiration Date: 202010032359
ISSUE DATE / TIME: 202008271718
ISSUE DATE / TIME: 202008271718
ISSUE DATE / TIME: 202008281714
ISSUE DATE / TIME: 202008290436
Unit Type and Rh: 5100
Unit Type and Rh: 5100
Unit Type and Rh: 5100
Unit Type and Rh: 5100

## 2019-04-04 LAB — TYPE AND SCREEN
ABO/RH(D): O POS
Antibody Screen: NEGATIVE
Unit division: 0
Unit division: 0
Unit division: 0
Unit division: 0

## 2019-04-04 NOTE — Discharge Summary (Signed)
Physician Discharge Summary  [name] NWG:[id number] DOB: 1966/07/17 DOA: 04/01/2019  PCP: Rocco Serene, MD  Admit date: 04/01/2019 Discharge date: 04/03/2019  Admitted From: Home  Disposition:  Home   Recommendations for Outpatient Follow-up:  1. Follow up with PCP in 1-2 weeks 2. Follow up with ID for hepatitis C and also to discuss patient's concerns about MRSA decolonization 3. Follow up with GI in 2-4 weeks for rectal bleeding     Home Health: None  Equipment/Devices: None  Discharge Condition: Good  CODE STATUS: FULL Diet recommendation: Renal  Brief/Interim Summary: [name] is a 53 y.o. F with ESRD on HD TThS, HTN, IDDM c/b mid-foot amputation 2014, Bipolar, chronic pain syndrome, chronic hep C, hx TIA and hx s and dCHF who presented with hematochezia, melena.           PRINCIPAL HOSPITAL DIAGNOSIS: Acute blood loss anemia from lower GI bleed    Discharge Diagnoses:   Acute blood loss anemia Acute lower GI bleed Admitted and had burgundy stool here.  Got 2 units PRBC transfusion night of admission.    Subsequently, GI were consulted and evaluated patient.  CTA abdomen showed no source of bleeding.  She had a brown bowel movement, no further clinical bleeding.  She was transfused a 3rd unit of blood, and post transfusion her H/H increased appropriately.  I discussed the case with Dr. Fuller Plan who felt no further interventions necessary during this hospitalization, as the bleeding had stopped.    GI follow up was arranged.   ESRD  Hypertension  Diabetes  Chronic systolic and diastolic CHF   Hepatitis C -Referral to ID  Rash She has multiple small punched out ulcers on the arms, scalp.  These are pruritic.  She believes they are MRSA and is convinced she needs a MRSA eradication regimen.  She was directed to resources online about MRSA de-colonization regimens for the home and also recommended to discuss this with her  PCP.          Discharge Instructions  Discharge Instructions    Ambulatory referral to Gastroenterology   Complete by: As directed    Please discuss with Dr. Fuller Plan timing of follow up appointment, in 2-4 weeks   Ambulatory referral to Infectious Disease   Complete by: As directed    Patient referral for hepatitis C Also would like education about MRSA de-colonization   Discharge instructions   Complete by: As directed    From Dr. Loleta Books: You were admitted with rectal bleeding.  The CT scan didn't show the source of the bleeding, and it stopped by itself, suggesting it was from hemorrhoids or from diverticuli (these are common spots in the colon that bleed, almost never dangerous)  Use the Anusol for hemorrhoids Use the mupirocin on your skin ulcers  Follow up with your primary care doctor in 1-2 weeks  I have sent referrals to the GI doctors office in a few weeks, you should call them to confirm that they are scheduling you an appointment.  I have also sent a referral to the infectious disease doctors ([facility]), call them also to confirm they are scheduling you.   Increase activity slowly   Complete by: As directed      Allergies as of 04/03/2019      Reactions   Penicillins Hives, Other (See Comments)   Has patient had a PCN reaction causing immediate rash, facial/tongue/throat swelling, SOB or lightheadedness with hypotension: Yes Has patient  had a PCN reaction causing severe rash involving mucus membranes or skin necrosis: No Has patient had a PCN reaction that required hospitalization No Has patient had a PCN reaction occurring within the last 10 years: No If all of the above answers are "NO", then may proceed with Cephalosporin use. Pt tolerates cefepime and ceftriaxone   Gabapentin Itching, Swelling   Lyrica [pregabalin] Swelling, Other (See Comments)   Facial and leg swelling   Saphris [asenapine] Swelling, Other (See  Comments)   Facial swelling   Tramadol Swelling, Other (See Comments)   Leg swelling   Adhesive [tape] Itching   SEVERE ITCHING   Latex Itching   SEVERE ITCHING   Other Other (See Comments)   NO BLOOD PRODUCTS; PATIENT IS A JEHOVAH'S WITNESS      Medication List    TAKE these medications   acetaminophen 325 MG tablet Commonly known as: TYLENOL Take 2 tablets (650 mg total) by mouth every 6 (six) hours as needed for fever, headache or moderate pain.   albuterol (2.5 MG/3ML) 0.083% nebulizer solution Commonly known as: PROVENTIL Take 5 mg by nebulization daily as needed for wheezing or shortness of breath.   albuterol 108 (90 [name]) MCG/ACT inhaler Commonly known as: VENTOLIN HFA Inhale 2 puffs into the lungs every 4 (four) hours as needed for wheezing or shortness of breath.   allopurinol 100 MG tablet Commonly known as: ZYLOPRIM Take 2 tablets (200 mg total) by mouth daily.   ALPRAZolam 0.5 MG tablet Commonly known as: XANAX Take 0.5 mg by mouth 3 (three) times daily as needed for anxiety.   amLODipine 2.5 MG tablet Commonly known as: NORVASC Take 2.5 mg by mouth daily.   atorvastatin 40 MG tablet Commonly known as: LIPITOR Take 1 tablet (40 mg total) by mouth at bedtime.   benzonatate 200 MG capsule Commonly known as: TESSALON Take 1 capsule (200 mg total) by mouth 2 (two) times daily as needed for cough.   Darbepoetin Alfa 200 MCG/0.4ML Sosy injection Commonly known as: ARANESP Inject 0.4 mLs (200 mcg total) into the vein every Wednesday with hemodialysis.   diphenhydrAMINE 25 mg capsule Commonly known as: BENADRYL Take 1 capsule (25 mg total) by mouth every 6 (six) hours as needed for up to 8 days for itching.   DULoxetine 30 MG capsule Commonly known as: CYMBALTA Take 2 capsules (60 mg total) by mouth daily. What changed: how much to take   hydrocortisone 2.5 % rectal cream Commonly known as: ANUSOL-HC Place 1 application rectally 2 (two) times  daily.   hydrOXYzine 25 MG tablet Commonly known as: ATARAX/VISTARIL Take 1 tablet (25 mg total) by mouth 3 (three) times daily as needed for itching or anxiety.   insulin aspart 100 UNIT/ML injection Commonly known as: novoLOG 0-9 Units, Subcutaneous, 3 times daily with meals CBG < 70: implement hypoglycemia protocol CBG 70 - 120: 0 units CBG 121 - 150: 1 unit CBG 151 - 200: 2 units CBG 201 - 250: 3 units CBG 251 - 300: 5 units CBG 301 - 350: 7 units CBG 351 - 400: 9 units CBG > 400: call MD   insulin glargine 100 UNIT/ML injection Commonly known as: LANTUS Inject 24 Units into the skin at bedtime.   labetalol 200 MG tablet Commonly known as: NORMODYNE Take 1 tablet (200 mg total) by mouth 2 (two) times daily.   lidocaine-prilocaine cream Commonly known as: EMLA Apply 1 application topically See admin instructions. On dialysis days   methocarbamol 500  MG tablet Commonly known as: ROBAXIN Take 500 mg by mouth daily as needed for muscle spasms.   mupirocin ointment 2 % Commonly known as: BACTROBAN Apply 1 application topically as needed (on arm).   Oxycodone HCl 10 MG Tabs Take 1 tablet (10 mg total) by mouth 4 (four) times daily.   oxymetazoline 0.05 % nasal spray Commonly known as: AFRIN Place 2 sprays into both nostrils 2 (two) times daily.   pantoprazole 40 MG tablet Commonly known as: PROTONIX Take 1 tablet (40 mg total) by mouth daily at 6 (six) AM for 30 days.   SARNA ULTRA EX Apply 1 application topically daily as needed (itch).   senna-docusate 8.6-50 MG tablet Commonly known as: Senokot-S Take 2 tablets by mouth at bedtime.   traZODone 50 MG tablet Commonly known as: DESYREL Take 0.5 tablets (25 mg total) by mouth at bedtime.      Follow-up Information    [facility] Follow up.   Why: physical therapy       [facility]              Follow up.   Why: Call to follow up for hepatitis and MRSA Contact  information: [address] [place] [address]       [facility] Follow up.   Specialty: Gastroenterology Why: Call to follow up for bleeding Contact information: [place] [phone] [phone]         Allergies  Allergen Reactions  . Penicillins Hives and Other (See Comments)    Has patient had a PCN reaction causing immediate rash, facial/tongue/throat swelling, SOB or lightheadedness with hypotension: Yes Has patient had a PCN reaction causing severe rash involving mucus membranes or skin necrosis: No Has patient had a PCN reaction that required hospitalization No Has patient had a PCN reaction occurring within the last 10 years: No If all of the above answers are "NO", then may proceed with Cephalosporin use. Pt tolerates cefepime and ceftriaxone  . Gabapentin Itching and Swelling  . Lyrica [Pregabalin] Swelling and Other (See Comments)    Facial and leg swelling  . Saphris [Asenapine] Swelling and Other (See Comments)    Facial swelling  . Tramadol Swelling and Other (See Comments)    Leg swelling  . Adhesive [Tape] Itching    SEVERE ITCHING  . Latex Itching    SEVERE ITCHING  . Other Other (See Comments)    NO BLOOD PRODUCTS; PATIENT IS A JEHOVAH'S WITNESS    Consultations:  GI   Procedures/Studies: Dg Chest Port 1 View  Result Date: 04/01/2019 CLINICAL DATA:  Chest pain, shortness of breath. EXAM: PORTABLE CHEST 1 VIEW COMPARISON:  Radiographs of February 24, 2019. FINDINGS: Stable cardiomegaly. No pneumothorax or pleural effusion is noted. No acute pulmonary disease is noted. Bony thorax is unremarkable. IMPRESSION: No active disease. Electronically Signed   By: Marijo Conception M.D.   On: 04/01/2019 11:57   Ct Angio Abd/pel W/ And/or W/o  Result Date: 04/02/2019 CLINICAL DATA:  53 year old female with GI bleed. EXAM: CTA ABDOMEN AND PELVIS wITHOUT AND WITH CONTRAST TECHNIQUE:  Multidetector CT imaging of the abdomen and pelvis was performed using the standard protocol during bolus administration of intravenous contrast. Multiplanar reconstructed images and MIPs were obtained and reviewed to evaluate the vascular anatomy. CONTRAST:  187mL OMNIPAQUE IOHEXOL 350 MG/ML SOLN COMPARISON:  Pelvis CT of the abdomen pelvis dated 01/22/2019 FINDINGS: VASCULAR Aorta: Normal  caliber aorta without aneurysm, dissection, vasculitis or significant stenosis. Celiac: Patent without evidence of aneurysm, dissection, vasculitis or significant stenosis. SMA: Mild atherosclerotic calcification SMA. The SMA is widely patent. Renals: Both renal arteries are patent without evidence of aneurysm, dissection, vasculitis, fibromuscular dysplasia or significant stenosis. IMA: Patent without evidence of aneurysm, dissection, vasculitis or significant stenosis. Inflow: Patent without evidence of aneurysm, dissection, vasculitis or significant stenosis. Atherosclerotic calcification of the iliac arteries. Proximal Outflow: Moderate atherosclerotic calcification of the visualized femoral arteries. The femoral arteries are patent. Veins: The SMV, splenic vein, and main portal vein are patent. The IVC is unremarkable. No portal venous gas. Review of the MIP images confirms the above findings. NON-VASCULAR Lower chest: There is mild cardiomegaly. There is retrograde flow of contrast from the right atrium into the IVC suggestive of right heart dysfunction. There are bibasilar linear atelectasis/scarring. Small bilateral pleural effusions with associated mild compressive atelectasis of the lower lobes. No intra-abdominal free air.  Small subhepatic free fluid. Hepatobiliary: Morphologic changes of cirrhosis. There is moderate periportal edema there is a small stone in the gallbladder. Small amount of fluid adjacent to the gallbladder may be related to underlying liver disease. Ultrasound may provide better evaluation the  gallbladder if there is high clinical concern for acute cholecystitis. Pancreas: The pancreas is grossly unremarkable as visualized. Spleen: Normal in size without focal abnormality. Adrenals/Urinary Tract: The adrenal glands are unremarkable. Mild bilateral renal parenchyma atrophy. Probable punctate nonobstructing right renal interpolar calculus versus vascular calcification. There is no hydronephrosis on either side. The urinary bladder is collapsed. Stomach/Bowel: There is moderate stool throughout the colon. Evaluation for GI bleed is limited as non-contrast images are not provided. No definite active bleed identified. There is no evidence of bowel obstruction or active inflammation. The appendix is normal. Lymphatic: No definite adenopathy. Top-normal bilateral inguinal lymph nodes, likely reactive. Reproductive: Hysterectomy. No pelvic mass. Other: Mild diffuse subcutaneous edema. No fluid collection. Musculoskeletal: No acute or significant osseous findings. IMPRESSION: 1. No acute intra-abdominal or pelvic pathology. No CT evidence of active GI bleed. 2. Cirrhosis and small subhepatic ascites. 3. Cholelithiasis. Ultrasound may provide better evaluation the gallbladder if there is high clinical concern for acute cholecystitis. 4. Moderate colonic stool burden. No bowel obstruction or active inflammation. Normal appendix. 5. Small bilateral pleural effusions with associated mild compressive atelectasis of the lower lobes. Electronically Signed   By: Anner Crete M.D.   On: 04/02/2019 21:45      Subjective: No more melena, hematochezia, hematemesis.  No vomiting, confusion.  Discharge Exam: Vitals:   04/03/19 1030 04/03/19 1034  BP: (!) 152/78 (!) 154/70  Pulse: 76 74  Resp:  16  Temp:  97.8 F (36.6 C)  SpO2:  96%   Vitals:   04/03/19 0930 04/03/19 1000 04/03/19 1030 04/03/19 1034  BP: (!) 144/69 (!) 153/73 (!) 152/78 (!) 154/70  Pulse: 74 74 76 74  Resp:    16  Temp:    97.8 F  (36.6 C)  TempSrc:    Oral  SpO2:    96%  Weight:    73.3 kg  Height:        General: Pt is alert, awake, not in acute distress Cardiovascular: RRR, nl S1-S2, no murmurs appreciated.   No LE edema.   Respiratory: Normal respiratory rate and rhythm.  CTAB without rales or wheezes. Abdominal: Abdomen soft and non-tender.  No distension or HSM.   Neuro/Psych: Strength symmetric in upper and lower extremities.  Judgment and  insight appear normal.   The results of significant diagnostics from this hospitalization (including imaging, microbiology, ancillary and laboratory) are listed below for reference.     Microbiology: Recent Results (from the past 240 hour(s))  SARS Coronavirus 2 [facility] order, Performed in [facility] hospital lab) Nasopharyngeal Nasopharyngeal Swab     Status: None   Collection Time: 04/01/19  2:58 PM   Specimen: Nasopharyngeal Swab  Result Value Ref Range Status   SARS Coronavirus 2 NEGATIVE NEGATIVE Final    Comment: (NOTE) If result is NEGATIVE SARS-CoV-2 target nucleic acids are NOT DETECTED. The SARS-CoV-2 RNA is generally detectable in upper and lower  respiratory specimens during the acute phase of infection. The lowest  concentration of SARS-CoV-2 viral copies this assay can detect is 250  copies / mL. A negative result does not preclude SARS-CoV-2 infection  and should not be used as the sole basis for treatment or other  patient management decisions.  A negative result may occur with  improper specimen collection / handling, submission of specimen other  than nasopharyngeal swab, presence of viral mutation(s) within the  areas targeted by this assay, and inadequate number of viral copies  (<250 copies / mL). A negative result must be combined with clinical  observations, patient history, and epidemiological information. If result is POSITIVE SARS-CoV-2 target nucleic acids are DETECTED. The SARS-CoV-2 RNA is generally detectable in upper and lower   respiratory specimens dur ing the acute phase of infection.  Positive  results are indicative of active infection with SARS-CoV-2.  Clinical  correlation with patient history and other diagnostic information is  necessary to determine patient infection status.  Positive results do  not rule out bacterial infection or co-infection with other viruses. If result is PRESUMPTIVE POSTIVE SARS-CoV-2 nucleic acids MAY BE PRESENT.   A presumptive positive result was obtained on the submitted specimen  and confirmed on repeat testing.  While 2019 novel coronavirus  (SARS-CoV-2) nucleic acids may be present in the submitted sample  additional confirmatory testing may be necessary for epidemiological  and / or clinical management purposes  to differentiate between  SARS-CoV-2 and other Sarbecovirus currently known to infect humans.  If clinically indicated additional testing with an alternate test  methodology [phone]) is advised. The SARS-CoV-2 RNA is generally  detectable in upper and lower respiratory sp ecimens during the acute  phase of infection. The expected result is Negative. Fact Sheet for Patients:  StrictlyIdeas.no Fact Sheet for Healthcare Providers: BankingDealers.co.za This test is not yet approved or cleared by the [place] FDA and has been authorized for detection and/or diagnosis of SARS-CoV-2 by FDA under an Emergency Use Authorization (EUA).  This EUA will remain in effect (meaning this test can be used) for the duration of the COVID-19 declaration under Section 564(b)(1) of the Act, 21 U.S.C. section 360bbb-3(b)(1), unless the authorization is terminated or revoked sooner. Performed at [facility], [place] [address], [place], [place] [address]      Labs: BNP (last 3 results) No results for input(s): BNP in the last 8760 hours. Basic Metabolic Panel: Recent Labs  Lab 04/01/19 1145 04/02/19 0335 04/03/19 0341   NA 136 137 132*  K 6.0* 4.1 5.7*  CL 93* 96* 93*  CO2 22 28 27   GLUCOSE 303* 205* 182*  BUN 111* 36* 57*  CREATININE 7.74* 3.47* 4.92*  CALCIUM 8.9 8.5* 8.4*   Liver Function Tests: Recent Labs  Lab 04/01/19 1145  AST 49*  ALT 47*  ALKPHOS 106  BILITOT 0.8  PROT 6.4*  ALBUMIN 3.3*   No results for input(s): LIPASE, AMYLASE in the last 168 hours. No results for input(s): AMMONIA in the last 168 hours. CBC: Recent Labs  Lab 04/01/19 1145 04/02/19 0335 04/02/19 1227 04/02/19 1620 04/02/19 2226 04/03/19 0341 04/03/19 1135  WBC 3.4* 4.0  --   --   --  3.4*  --   HGB 4.8* 7.4* 7.3* 6.6* 7.0* 6.4* 8.3*  HCT 15.5* 23.1* 23.1* 20.6* 20.8* 19.2* 24.7*  MCV 108.4* 100.0  --   --   --  98.0  --   PLT 102* 121*  --   --   --  99*  --    Cardiac Enzymes: No results for input(s): CKTOTAL, CKMB, CKMBINDEX, TROPONINI in the last 168 hours. BNP: Invalid input(s): POCBNP CBG: Recent Labs  Lab 04/02/19 1129 04/02/19 1617 04/02/19 2018 04/03/19 1136  GLUCAP 218* 167* 199* 89   D-Dimer No results for input(s): DDIMER in the last 72 hours. Hgb A1c No results for input(s): HGBA1C in the last 72 hours. Lipid Profile No results for input(s): CHOL, HDL, LDLCALC, TRIG, CHOLHDL, LDLDIRECT in the last 72 hours. Thyroid function studies No results for input(s): TSH, T4TOTAL, T3FREE, THYROIDAB in the last 72 hours.  Invalid input(s): FREET3 Anemia work up Recent Labs    04/01/19 2013  VITAMINB12 6,897*  FOLATE 57.9  FERRITIN 804*  TIBC 326  IRON 95  RETICCTPCT 3.6*   Urinalysis    Component Value Date/Time   COLORURINE YELLOW 02/25/2017 1224   APPEARANCEUR HAZY (A) 02/25/2017 1224   LABSPEC 1.013 02/25/2017 1224   PHURINE 8.0 02/25/2017 1224   GLUCOSEU >=500 (A) 02/25/2017 1224   HGBUR SMALL (A) 02/25/2017 1224   BILIRUBINUR NEGATIVE 02/25/2017 1224   BILIRUBINUR neg 12/13/2014 1127   KETONESUR NEGATIVE 02/25/2017 1224   PROTEINUR >=300 (A) 02/25/2017 1224    UROBILINOGEN 0.2 05/01/2015 1252   NITRITE NEGATIVE 02/25/2017 1224   LEUKOCYTESUR NEGATIVE 02/25/2017 1224   Sepsis Labs Invalid input(s): PROCALCITONIN,  WBC,  LACTICIDVEN Microbiology Recent Results (from the past 240 hour(s))  SARS Coronavirus 2 [facility] order, Performed in [place] hospital lab) Nasopharyngeal Nasopharyngeal Swab     Status: None   Collection Time: 04/01/19  2:58 PM   Specimen: Nasopharyngeal Swab  Result Value Ref Range Status   SARS Coronavirus 2 NEGATIVE NEGATIVE Final    Comment: (NOTE) If result is NEGATIVE SARS-CoV-2 target nucleic acids are NOT DETECTED. The SARS-CoV-2 RNA is generally detectable in upper and lower  respiratory specimens during the acute phase of infection. The lowest  concentration of SARS-CoV-2 viral copies this assay can detect is 250  copies / mL. A negative result does not preclude SARS-CoV-2 infection  and should not be used as the sole basis for treatment or other  patient management decisions.  A negative result may occur with  improper specimen collection / handling, submission of specimen other  than nasopharyngeal swab, presence of viral mutation(s) within the  areas targeted by this assay, and inadequate number of viral copies  (<250 copies / mL). A negative result must be combined with clinical  observations, patient history, and epidemiological information. If result is POSITIVE SARS-CoV-2 target nucleic acids are DETECTED. The SARS-CoV-2 RNA is generally detectable in upper and lower  respiratory specimens dur ing the acute phase of infection.  Positive  results are indicative of active infection with SARS-CoV-2.  Clinical  correlation with patient history and other diagnostic  information is  necessary to determine patient infection status.  Positive results do  not rule out bacterial infection or co-infection with other viruses. If result is PRESUMPTIVE POSTIVE SARS-CoV-2 nucleic acids MAY BE PRESENT.   A  presumptive positive result was obtained on the submitted specimen  and confirmed on repeat testing.  While 2019 novel coronavirus  (SARS-CoV-2) nucleic acids may be present in the submitted sample  additional confirmatory testing may be necessary for epidemiological  and / or clinical management purposes  to differentiate between  SARS-CoV-2 and other Sarbecovirus currently known to infect humans.  If clinically indicated additional testing with an alternate test  methodology [phone]) is advised. The SARS-CoV-2 RNA is generally  detectable in upper and lower respiratory sp ecimens during the acute  phase of infection. The expected result is Negative. Fact Sheet for Patients:  StrictlyIdeas.no Fact Sheet for Healthcare Providers: BankingDealers.co.za This test is not yet approved or cleared by the [place] FDA and has been authorized for detection and/or diagnosis of SARS-CoV-2 by FDA under an Emergency Use Authorization (EUA).  This EUA will remain in effect (meaning this test can be used) for the duration of the COVID-19 declaration under Section 564(b)(1) of the Act, 21 U.S.C. section 360bbb-3(b)(1), unless the authorization is terminated or revoked sooner. Performed at [facility], [place] [address] [place], [place] [address]      Time coordinating discharge: 45 minutes The [place] controlled substances registry was reviewed for this patient prior to filling the <5 days supply controlled substances script.      SIGNED:   Edwin Dada, MD  [facility] 04/03/2019, 4:53 PM

## 2019-04-22 ENCOUNTER — Telehealth: Payer: Self-pay | Admitting: Pharmacy Technician

## 2019-04-22 ENCOUNTER — Encounter: Payer: Medicare Other | Admitting: Infectious Diseases

## 2019-04-22 NOTE — Telephone Encounter (Signed)
[facility] Patient Teacher, [name].    The patient is insured through Medicare.  We will continue to follow to see if copay assistance is needed.  [name]. Nadara Mustard [place] Patient [facility] for Infectious Disease Phone: [phone] Fax:  [phone]

## 2019-05-10 IMAGING — US US EXTREM LOW*L* LIMITED
1 series · 7 of 7 positions shown · non-contrast
Comparison: None.

CLINICAL DATA: Assess left thigh cellulitis.  Initial encounter.

EXAM:
ULTRASOUND LEFT LOWER EXTREMITY LIMITED
TECHNIQUE: Ultrasound examination of the lower extremity soft tissues was
performed in the area of clinical concern.

[Series 1: us extrem low*left* limited · 0.07mm/px · 7 of 7 slices shown]
[im 1/7]
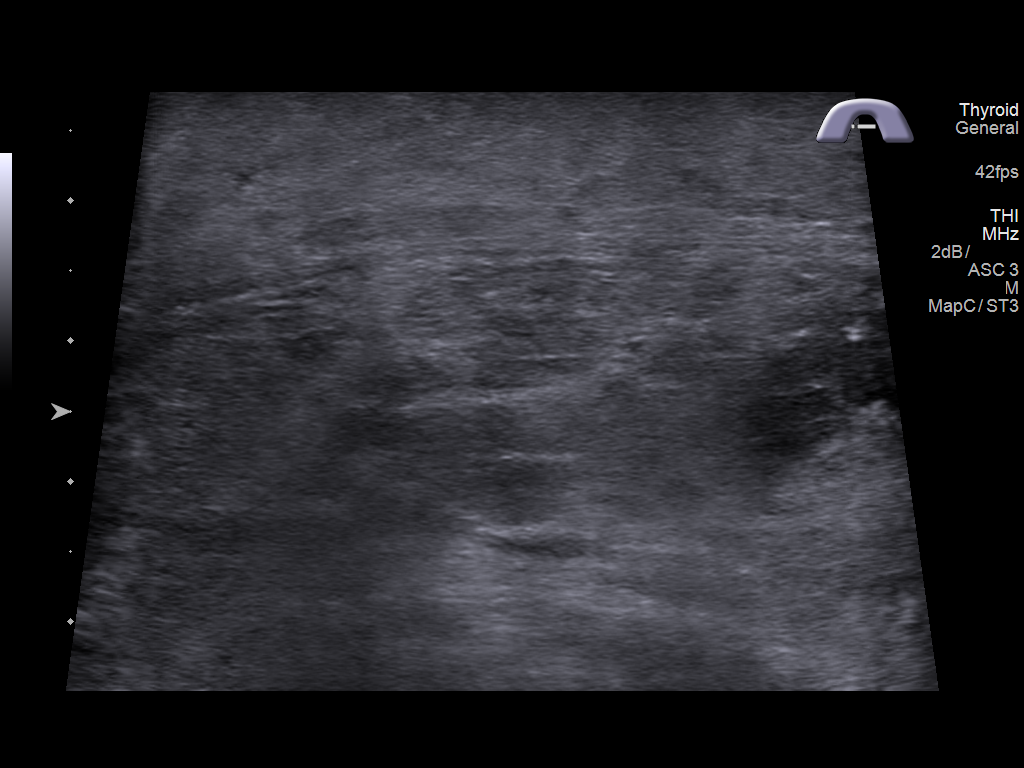
[im 2/7]
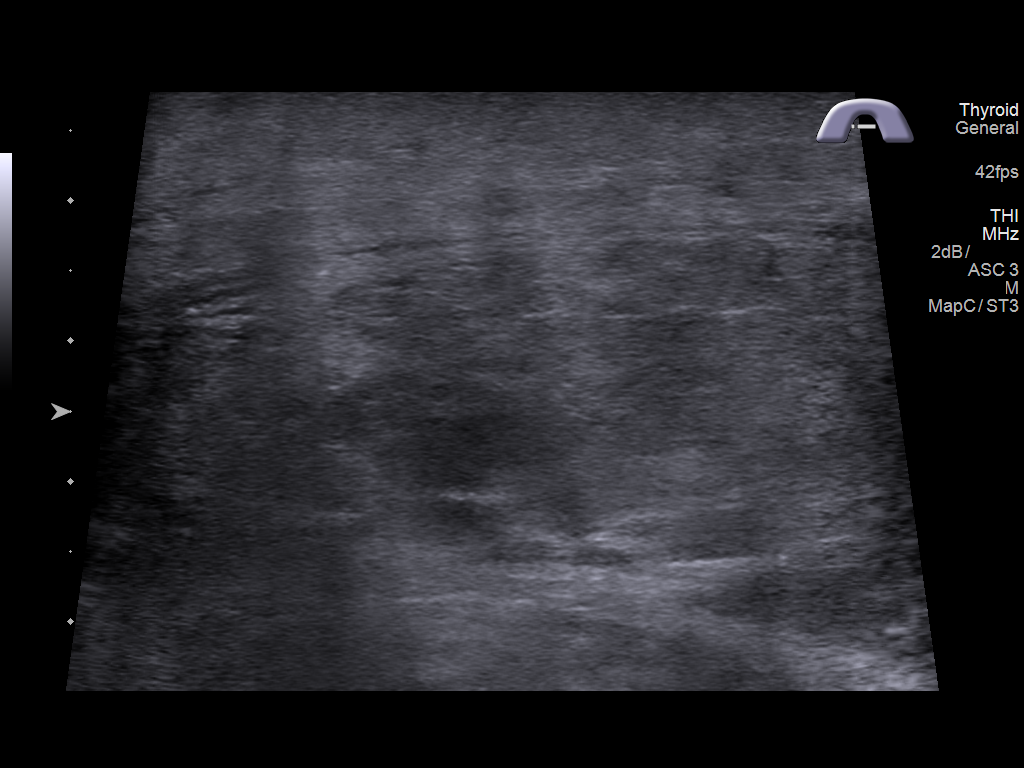
[im 3/7]
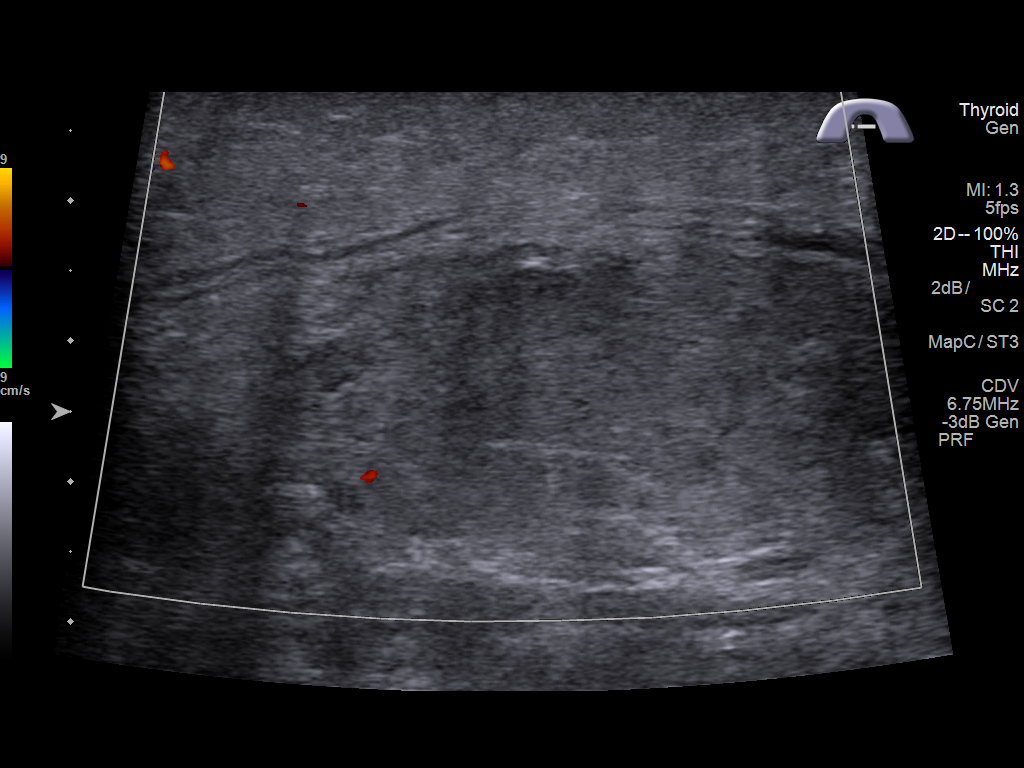
[im 4/7]
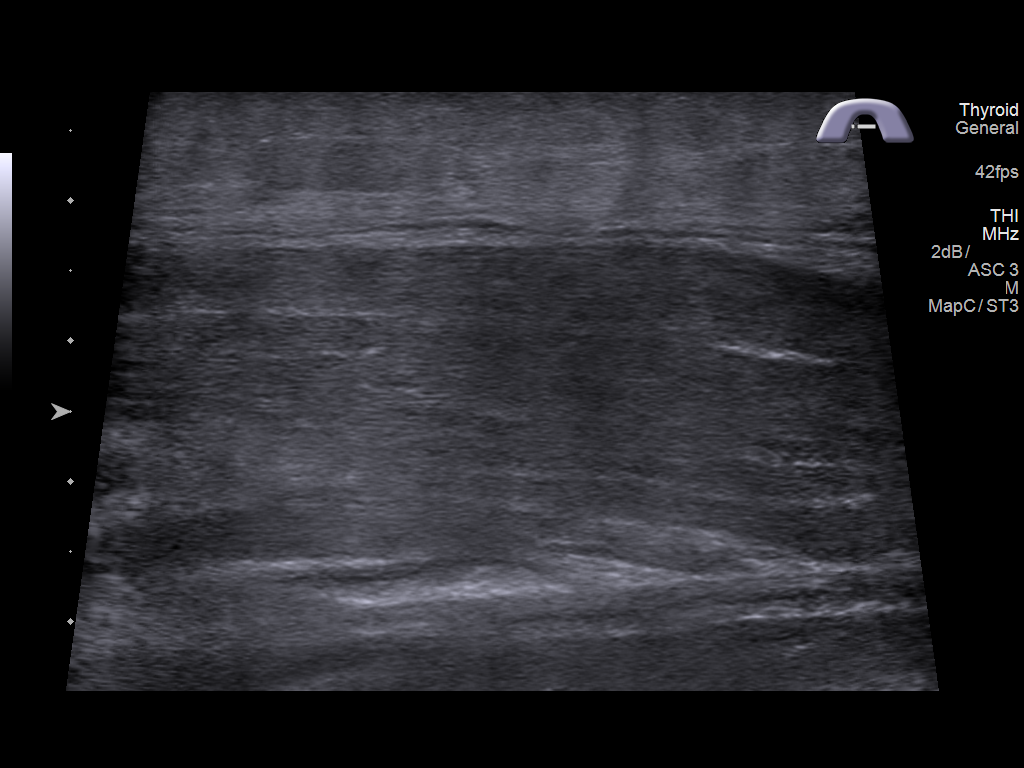
[im 5/7]
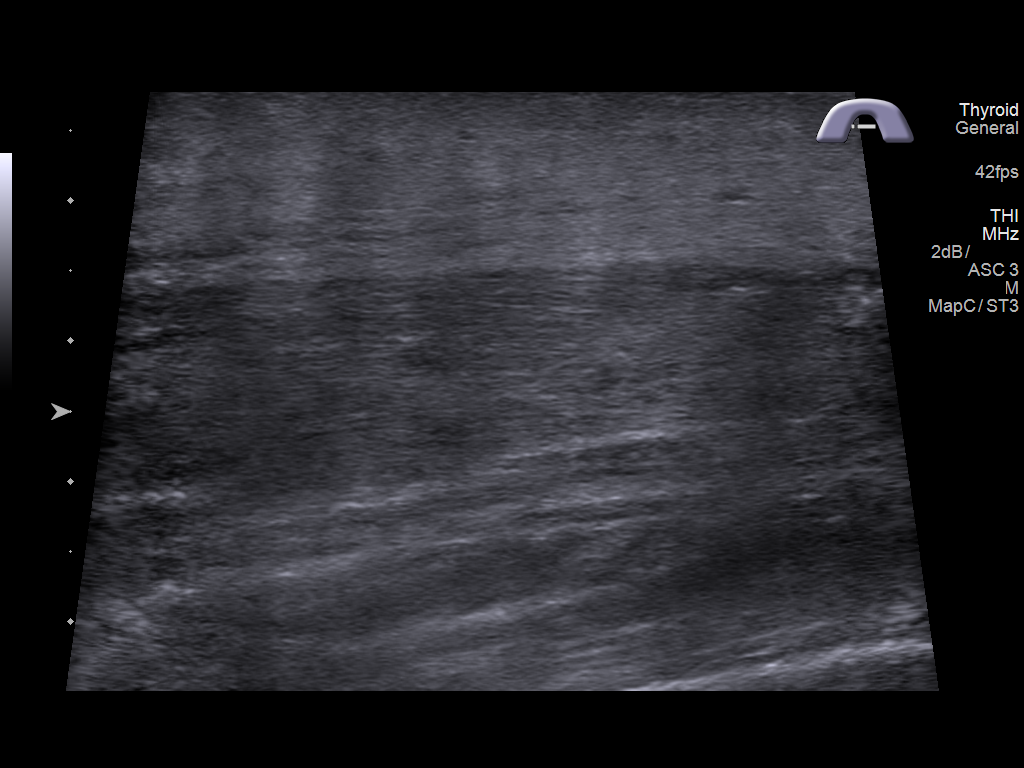
[im 6/7]
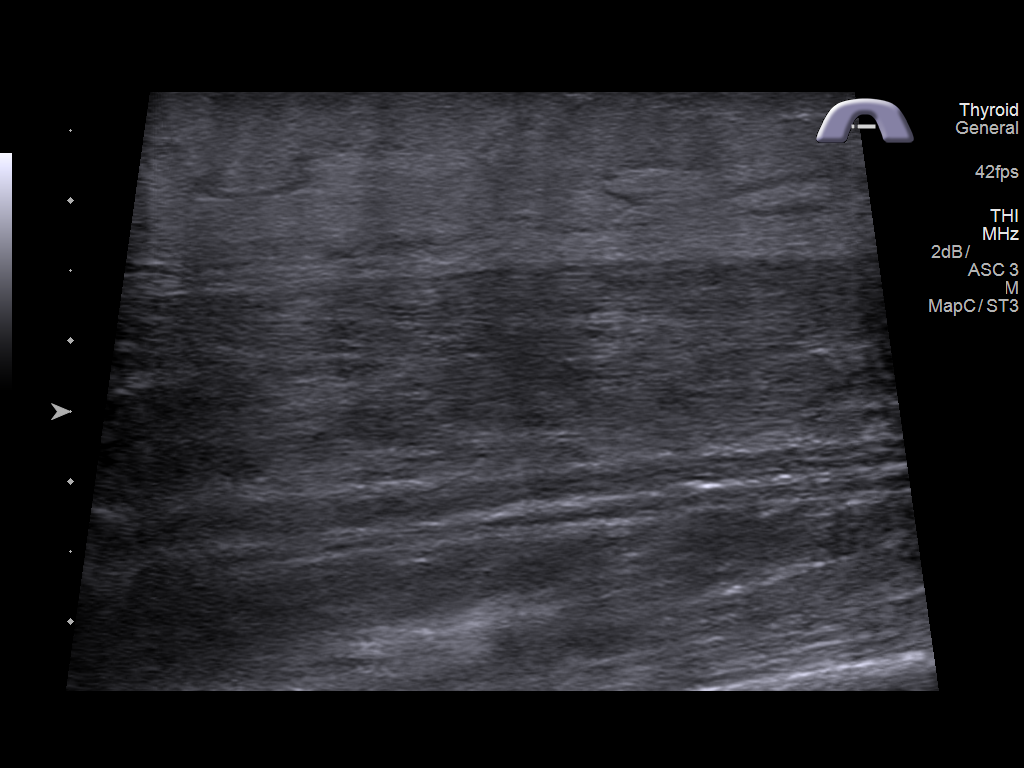
[im 7/7]
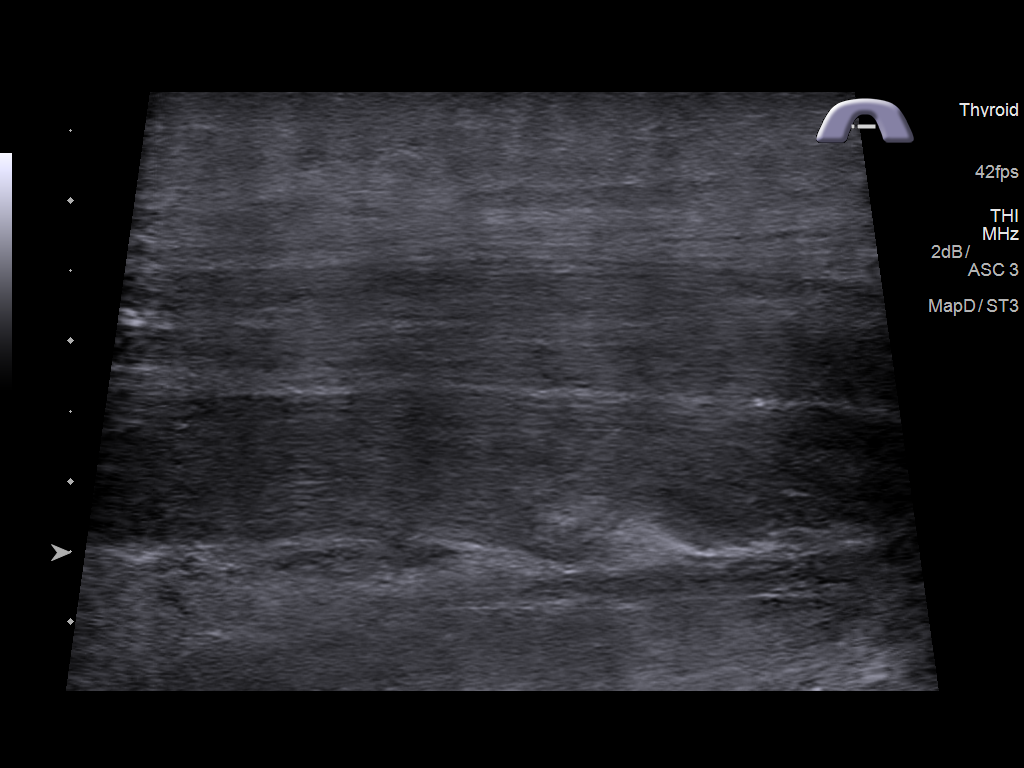

[7 of 7 positions shown; findings below may reference images not displayed]

FINDINGS: Joint Space: Not assessed.

Muscles: There is vague edema about the superficial musculature.

Tendons: Not assessed.

Other Soft Tissue Structures: Mild soft tissue edema is noted
tracking about the fat overlying the musculature of the left lower
thigh. There is no evidence of hyperemia. There is no evidence of
abscess.
IMPRESSION: Mild soft tissue edema noted, tracking to the superficial
musculature of the left lower thigh. No evidence of abscess or
hyperemia.

## 2019-05-13 ENCOUNTER — Encounter (HOSPITAL_COMMUNITY): Payer: Self-pay | Admitting: Student

## 2019-05-13 ENCOUNTER — Encounter: Payer: Self-pay | Admitting: Family Medicine

## 2019-05-13 ENCOUNTER — Emergency Department (HOSPITAL_COMMUNITY): Payer: Medicare Other

## 2019-05-13 ENCOUNTER — Inpatient Hospital Stay (HOSPITAL_COMMUNITY)
Admission: EM | Admit: 2019-05-13 | Discharge: 2019-05-16 | DRG: 377 | Disposition: A | Discharge status: Home / Self Care | Payer: Medicare Other | Attending: Internal Medicine | Admitting: Internal Medicine

## 2019-05-13 DIAGNOSIS — F17210 Nicotine dependence, cigarettes, uncomplicated: Secondary | ICD-10-CM | POA: Diagnosis present

## 2019-05-13 DIAGNOSIS — B86 Scabies: Secondary | ICD-10-CM | POA: Diagnosis present

## 2019-05-13 DIAGNOSIS — Z8249 Family history of ischemic heart disease and other diseases of the circulatory system: Secondary | ICD-10-CM

## 2019-05-13 DIAGNOSIS — E1122 Type 2 diabetes mellitus with diabetic chronic kidney disease: Secondary | ICD-10-CM | POA: Diagnosis present

## 2019-05-13 DIAGNOSIS — E1110 Type 2 diabetes mellitus with ketoacidosis without coma: Secondary | ICD-10-CM | POA: Diagnosis present

## 2019-05-13 DIAGNOSIS — J9601 Acute respiratory failure with hypoxia: Secondary | ICD-10-CM | POA: Diagnosis present

## 2019-05-13 DIAGNOSIS — D631 Anemia in chronic kidney disease: Secondary | ICD-10-CM | POA: Diagnosis present

## 2019-05-13 DIAGNOSIS — N186 End stage renal disease: Secondary | ICD-10-CM | POA: Diagnosis present

## 2019-05-13 DIAGNOSIS — D62 Acute posthemorrhagic anemia: Secondary | ICD-10-CM | POA: Diagnosis present

## 2019-05-13 DIAGNOSIS — D649 Anemia, unspecified: Secondary | ICD-10-CM | POA: Diagnosis not present

## 2019-05-13 DIAGNOSIS — D500 Iron deficiency anemia secondary to blood loss (chronic): Secondary | ICD-10-CM | POA: Diagnosis not present

## 2019-05-13 DIAGNOSIS — Z9115 Patient's noncompliance with renal dialysis: Secondary | ICD-10-CM

## 2019-05-13 DIAGNOSIS — Z833 Family history of diabetes mellitus: Secondary | ICD-10-CM | POA: Diagnosis not present

## 2019-05-13 DIAGNOSIS — K921 Melena: Principal | ICD-10-CM | POA: Diagnosis present

## 2019-05-13 DIAGNOSIS — R9431 Abnormal electrocardiogram [ECG] [EKG]: Secondary | ICD-10-CM | POA: Diagnosis present

## 2019-05-13 DIAGNOSIS — K922 Gastrointestinal hemorrhage, unspecified: Secondary | ICD-10-CM | POA: Diagnosis not present

## 2019-05-13 DIAGNOSIS — D696 Thrombocytopenia, unspecified: Secondary | ICD-10-CM | POA: Diagnosis present

## 2019-05-13 DIAGNOSIS — I132 Hypertensive heart and chronic kidney disease with heart failure and with stage 5 chronic kidney disease, or end stage renal disease: Secondary | ICD-10-CM | POA: Diagnosis present

## 2019-05-13 DIAGNOSIS — Z20828 Contact with and (suspected) exposure to other viral communicable diseases: Secondary | ICD-10-CM | POA: Diagnosis present

## 2019-05-13 DIAGNOSIS — Z992 Dependence on renal dialysis: Secondary | ICD-10-CM | POA: Diagnosis not present

## 2019-05-13 DIAGNOSIS — G40909 Epilepsy, unspecified, not intractable, without status epilepticus: Secondary | ICD-10-CM | POA: Diagnosis present

## 2019-05-13 DIAGNOSIS — Z89432 Acquired absence of left foot: Secondary | ICD-10-CM

## 2019-05-13 LAB — COMPREHENSIVE METABOLIC PANEL
ALT: 63 U/L — ABNORMAL HIGH (ref 0–44)
AST: 43 U/L — ABNORMAL HIGH (ref 15–41)
Albumin: 3.4 g/dL — ABNORMAL LOW (ref 3.5–5.0)
Alkaline Phosphatase: 93 U/L (ref 38–126)
Anion gap: 26 — ABNORMAL HIGH (ref 5–15)
BUN: 155 mg/dL — ABNORMAL HIGH (ref 6–20)
CO2: 19 mmol/L — ABNORMAL LOW (ref 22–32)
Calcium: 8.2 mg/dL — ABNORMAL LOW (ref 8.9–10.3)
Chloride: 83 mmol/L — ABNORMAL LOW (ref 98–111)
Creatinine, Ser: 13.3 mg/dL — ABNORMAL HIGH (ref 0.44–1.00)
GFR calc Af Amer: 3 mL/min — ABNORMAL LOW (ref >60)
GFR calc non Af Amer: 3 mL/min — ABNORMAL LOW (ref >60)
Glucose, Bld: 380 mg/dL — ABNORMAL HIGH (ref 70–99)
Potassium: 5 mmol/L (ref 3.5–5.1)
Sodium: 128 mmol/L — ABNORMAL LOW (ref 135–145)
Total Bilirubin: 1.2 mg/dL (ref 0.3–1.2)
Total Protein: 6.9 g/dL (ref 6.5–8.1)

## 2019-05-13 LAB — NO BLOOD PRODUCTS

## 2019-05-13 LAB — CBC WITH DIFFERENTIAL/PLATELET
Abs Immature Granulocytes: 0.03 10*3/uL (ref 0.00–0.07)
Basophils Absolute: 0 10*3/uL (ref 0.0–0.1)
Basophils Relative: 0 %
Eosinophils Absolute: 0 10*3/uL (ref 0.0–0.5)
Eosinophils Relative: 1 %
HCT: 18 % — ABNORMAL LOW (ref 36.0–46.0)
Hemoglobin: 5.7 g/dL — CL (ref 12.0–15.0)
Immature Granulocytes: 1 %
Lymphocytes Relative: 15 %
Lymphs Abs: 0.5 10*3/uL — ABNORMAL LOW (ref 0.7–4.0)
MCH: 32 pg (ref 26.0–34.0)
MCHC: 31.7 g/dL (ref 30.0–36.0)
MCV: 101.1 fL — ABNORMAL HIGH (ref 80.0–100.0)
Monocytes Absolute: 0.2 10*3/uL (ref 0.1–1.0)
Monocytes Relative: 6 %
Neutro Abs: 2.9 10*3/uL (ref 1.7–7.7)
Neutrophils Relative %: 77 %
Platelets: 114 10*3/uL — ABNORMAL LOW (ref 150–400)
RBC: 1.78 MIL/uL — ABNORMAL LOW (ref 3.87–5.11)
RDW: 15.2 % (ref 11.5–15.5)
WBC: 3.7 10*3/uL — ABNORMAL LOW (ref 4.0–10.5)
nRBC: 0 % (ref 0.0–0.2)

## 2019-05-13 LAB — CBG MONITORING, ED
Glucose-Capillary: 224 mg/dL — ABNORMAL HIGH (ref 70–99)
Glucose-Capillary: 380 mg/dL — ABNORMAL HIGH (ref 70–99)

## 2019-05-13 LAB — SARS CORONAVIRUS 2 BY RT PCR (HOSPITAL ORDER, PERFORMED IN ~~LOC~~ HOSPITAL LAB): SARS Coronavirus 2: NEGATIVE

## 2019-05-13 LAB — TROPONIN I (HIGH SENSITIVITY)
Troponin I (High Sensitivity): 39 ng/L — ABNORMAL HIGH (ref <18)
Troponin I (High Sensitivity): 38 ng/L — ABNORMAL HIGH (ref <18)

## 2019-05-13 LAB — POC OCCULT BLOOD, ED: Fecal Occult Bld: POSITIVE — AB

## 2019-05-13 LAB — LIPASE, BLOOD: Lipase: 66 U/L — ABNORMAL HIGH (ref 11–51)

## 2019-05-13 MED ORDER — CHLORHEXIDINE GLUCONATE CLOTH 2 % EX PADS
6.0000 | MEDICATED_PAD | Freq: Every day | CUTANEOUS | Status: DC
  Administered 2019-05-15 (×2): 6 via TOPICAL

## 2019-05-13 MED ORDER — SODIUM CHLORIDE 0.9 % IV SOLN
8.0000 mg/h | INTRAVENOUS | Status: DC
  Administered 2019-05-14: 8 mg/h via INTRAVENOUS
  Filled 2019-05-13 (×2): qty 80

## 2019-05-13 MED ORDER — INSULIN REGULAR(HUMAN) IN NACL 100-0.9 UT/100ML-% IV SOLN: INTRAVENOUS | Status: DC

## 2019-05-13 MED ORDER — DEXTROSE-NACL 5-0.45 % IV SOLN
INTRAVENOUS | Status: DC
  Administered 2019-05-14 – 2019-05-15 (×2): via INTRAVENOUS

## 2019-05-13 MED ORDER — SODIUM CHLORIDE 0.9 % IV SOLN
INTRAVENOUS | Status: DC
  Administered 2019-05-13: via INTRAVENOUS

## 2019-05-13 MED ORDER — SODIUM CHLORIDE 0.9 % IV SOLN
80.0000 mg | Freq: Once | INTRAVENOUS | Status: AC
  Administered 2019-05-13: 80 mg via INTRAVENOUS
  Filled 2019-05-13: qty 80

## 2019-05-13 MED ORDER — PANTOPRAZOLE SODIUM 40 MG IV SOLR: 40.0000 mg | Freq: Once | INTRAVENOUS | Status: DC

## 2019-05-13 MED ORDER — INSULIN REGULAR(HUMAN) IN NACL 100-0.9 UT/100ML-% IV SOLN
INTRAVENOUS | Status: DC
  Administered 2019-05-14: 1.6 [IU]/h via INTRAVENOUS
  Filled 2019-05-13: qty 100

## 2019-05-13 NOTE — ED Notes (Signed)
Waiting for Insulin gtt to come from pharmacy.  Pt resting quietly with eyes closed.  No complaints voiced

## 2019-05-13 NOTE — ED Provider Notes (Addendum)
[facility] EMERGENCY DEPARTMENT Provider Note   CSN: [id number] Arrival date & time: 05/13/19  1550     History   Chief Complaint Chief Complaint  Patient presents with   Shortness of Breath   Chest Pain    HPI [name] is a 53 y.o. female with a history of ESRD on dialysis MWF, anemia, anxiety, schizoaffective disorder, hepatitis C, HTN, hypercholesterolemia, noncompliance, CHF, substance abuse, & prior GI bleeds who presents to the ED with multiple complaints including N/V/D, fatigue, dyspnea, & chest discomfort. Patient states she has had N/V/D over the past 5 days- a few episodes of both vomiting and diarrhea which is aggravated with p.o. intake, no alleviating factors. Has felt fatigued. Over past few days started feeling intermittently short of breath with central chest discomfort. No alleviating/aggravating factors. Last dialyzed 05/05/19- has missed her last few dialysis sessions as she states she did not feel well enough to go. She also has not had her insulin.  Denies fever, chills, abdominal pain, melena, hematochezia, hematemesis, dizziness, syncope, or covid exposures. Denies recent abx, foreign travel, or hospitalizations. She states she drinks occassionally, not daily, denies NSAID use.      HPI  Past Medical History:  Diagnosis Date   Anemia    Anxiety    Bipolar disorder, unspecified ([place]) 02/06/2014   Chronic combined systolic and diastolic CHF (congestive heart failure) (HCC)    EF 40% by echo and 48% by stress test   Chronic pain syndrome    Depression    ESRD on hemodialysis (HCC)    TTS   GERD (gastroesophageal reflux disease)    Glaucoma    Headache    sinus headaches   Hepatitis C    High cholesterol    History of hiatal hernia    History of vitamin D deficiency 06/20/2015   Hypertension    MRSA infection ?2014   "on the back of my head; spread throughout my bloodstream"   Neuropathy    Refusal of blood  transfusions as patient is Jehovah's Witness    occasionally allows transfusions.     Schizoaffective disorder, unspecified condition 02/08/2014   Seizure ([place]) dx'd 2014   "don't know what kind; last one was ~ 04/2014"   TIA (transient ischemic attack) 2010   Type II diabetes mellitus ([place])    INSULIN DEPENDENT    Patient Active Problem List   Diagnosis Date Noted   Symptomatic anemia 04/01/2019   Type II diabetes mellitus with renal manifestations ([place]) 02/25/2019   TIA (transient ischemic attack) 02/25/2019   GERD (gastroesophageal reflux disease) 02/25/2019   Tobacco abuse 02/25/2019   Fluid overload 02/25/2019   Uremia 02/25/2019   Melena    Rectal bleed 12/30/2018   Gastritis and gastroduodenitis    Benign neoplasm of ascending colon    Benign neoplasm of transverse colon    Benign neoplasm of descending colon    GI bleeding 11/17/2018   Acute blood loss anemia    No blood products    GI bleed 10/05/2018   Prolonged QT interval 10/05/2018   Localized edema due to fluid overload 09/05/2017   Lower GI bleed    Evaluation by psychiatric service required 07/17/2017   Volume overload 06/03/2017   Right supracondylar humerus fracture, with nonunion, subsequent encounter 05/01/2017   Malnutrition of moderate degree 02/28/2017   Leg pain, left 02/25/2017   Left leg pain 02/25/2017   External hemorrhoids    Diverticulosis of colon without hemorrhage  Goals of care, counseling/discussion    Palliative care encounter    Cellulitis of left leg 01/16/2017   Upper GI bleed 01/16/2017   UGIB (upper gastrointestinal bleed)    Anemia due to chronic kidney disease    Cellulitis of left thigh 01/01/2017   ESRD on dialysis ([place]) 01/01/2017   Cocaine use disorder, severe, dependence ([place]) 08/29/2016   Hepatitis C 08/29/2016   Diabetic ulcer of calf ([place]) 08/29/2016   Diabetic ulcer of toe ([place]) 08/29/2016   Cerebrovascular accident  (CVA) ([place]) 08/29/2016   MDD (major depressive disorder) 08/28/2016   Dyslipidemia associated with type 2 diabetes mellitus ([place]) 11/10/2015   Hyperkalemia 09/17/2015   History of vitamin D deficiency 06/20/2015   Long term prescription opiate use 06/20/2015   Chronic pain syndrome 06/19/2015   Anemia of chronic kidney failure 05/01/2015   transmetatarsal amputation of the left foot 12/13/2014   Unilateral visual loss 11/22/2014   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 ([place]) 11/22/2014   Elevated troponin 11/09/2014   Essential hypertension 11/09/2014   Dizziness    Chronic combined systolic (congestive) and diastolic (congestive) heart failure ([place])    Chest pain 08/25/2014   Rectal bleeding 04/23/2014   Anemia, iron deficiency 04/13/2014   Noncompliance 02/02/2014   Hyponatremia 05/19/2013   Seizure disorder ([place]) 02/09/2013    Past Surgical History:  Procedure Laterality Date   ABDOMINAL HYSTERECTOMY  2014   ABDOMINAL HYSTERECTOMY  2010   AMPUTATION Left 05/21/2013   Procedure: Left Midfoot AMPUTATION;  Surgeon: Newt Minion, MD;  Location: [place];  Service: Orthopedics;  Laterality: Left;  Left Midfoot Amputation   AV FISTULA PLACEMENT Left 05/04/2015   Procedure: ARTERIOVENOUS (AV) GRAFT INSERTION;  Surgeon: Angelia Mould, MD;  Location: [place];  Service: Vascular;  Laterality: Left;   BIOPSY  11/18/2018   Procedure: BIOPSY;  Surgeon: Jerene Bears, MD;  Location: [facility];  Service: Gastroenterology;;   COLONOSCOPY WITH PROPOFOL N/A 06/22/2013   Procedure: COLONOSCOPY WITH PROPOFOL;  Surgeon: Jeryl Columbia, MD;  Location: [facility] ENDOSCOPY;  Service: Endoscopy;  Laterality: N/A;   COLONOSCOPY WITH PROPOFOL N/A 11/18/2018   Procedure: COLONOSCOPY WITH PROPOFOL;  Surgeon: Jerene Bears, MD;  Location: [place];  Service: Gastroenterology;  Laterality: N/A;   ENTEROSCOPY N/A 12/31/2018   Procedure: ENTEROSCOPY;  Surgeon: Irene Shipper,  MD;  Location: [facility] ENDOSCOPY;  Service: Endoscopy;  Laterality: N/A;  Push enteroscopy at 8am (spoke with Endo)   ESOPHAGOGASTRODUODENOSCOPY N/A 05/11/2015   Procedure: ESOPHAGOGASTRODUODENOSCOPY (EGD);  Surgeon: Laurence Spates, MD;  Location: [facility] ENDOSCOPY;  Service: Endoscopy;  Laterality: N/A;   ESOPHAGOGASTRODUODENOSCOPY (EGD) WITH PROPOFOL N/A 11/18/2018   Procedure: ESOPHAGOGASTRODUODENOSCOPY (EGD) WITH PROPOFOL;  Surgeon: Jerene Bears, MD;  Location: [facility] ENDOSCOPY;  Service: Gastroenterology;  Laterality: N/A;   EYE SURGERY Bilateral 2010   Lasik   FLEXIBLE SIGMOIDOSCOPY N/A 01/17/2017   Procedure: FLEXIBLE SIGMOIDOSCOPY w/ hemorrhoid banding possible;  Surgeon: Gatha Mayer, MD;  Location: [facility] ENDOSCOPY;  Service: Endoscopy;  Laterality: N/A;   FLEXIBLE SIGMOIDOSCOPY N/A 01/21/2019   Procedure: FLEXIBLE SIGMOIDOSCOPY;  Surgeon: Ronald Lobo, MD;  Location: [facility] ENDOSCOPY;  Service: Endoscopy;  Laterality: N/A;  Plan to do the test unprepped, unsedated   FOOT AMPUTATION Left    due diabetic neuropathy, could not feel ulcer on bottom of foot   GIVENS CAPSULE STUDY N/A 01/21/2019   Procedure: GIVENS CAPSULE STUDY;  Surgeon: Ronald Lobo, MD;  Location: [place];  Service: Endoscopy;  Laterality:  N/A;   POLYPECTOMY  11/18/2018   Procedure: POLYPECTOMY;  Surgeon: Jerene Bears, MD;  Location: [facility] ENDOSCOPY;  Service: Gastroenterology;;   REFRACTIVE SURGERY Bilateral 2013   TONSILLECTOMY  1970's   TUBAL LIGATION  2000     OB History    Gravida  0   Para  0   Term  0   Preterm  0   AB  0   Living        SAB  0   TAB  0   Ectopic  0   Multiple      Live Births               Home Medications    Prior to Admission medications   Medication Sig Start Date End Date Taking? Authorizing Provider  acetaminophen (TYLENOL) 325 MG tablet Take 2 tablets (650 mg total) by mouth every 6 (six) hours as needed for fever, headache or moderate pain. 02/26/19   Roxan Hockey, MD  albuterol (PROVENTIL) (2.5 MG/3ML) 0.083% nebulizer solution Take 5 mg by nebulization daily as needed for wheezing or shortness of breath.    [provider]  albuterol (VENTOLIN HFA) 108 (90 Base) MCG/ACT inhaler Inhale 2 puffs into the lungs every 4 (four) hours as needed for wheezing or shortness of breath. 01/26/19   Dahal, Marlowe Aschoff, MD  allopurinol (ZYLOPRIM) 100 MG tablet Take 2 tablets (200 mg total) by mouth daily. 02/26/19 04/01/19  Roxan Hockey, MD  ALPRAZolam Duanne Moron) 0.5 MG tablet Take 0.5 mg by mouth 3 (three) times daily as needed for anxiety. 02/16/19   [provider]  amLODipine (NORVASC) 2.5 MG tablet Take 2.5 mg by mouth daily. 03/16/19   [provider]  atorvastatin (LIPITOR) 40 MG tablet Take 1 tablet (40 mg total) by mouth at bedtime. 02/26/19   Roxan Hockey, MD  benzonatate (TESSALON) 200 MG capsule Take 1 capsule (200 mg total) by mouth 2 (two) times daily as needed for cough. 03/12/19   Mariel Aloe, MD  Darbepoetin Alfa (ARANESP) 200 MCG/0.4ML SOSY injection Inject 0.4 mLs (200 mcg total) into the vein every Wednesday with hemodialysis. 01/27/19   Terrilee Croak, MD  diphenhydrAMINE (BENADRYL) 25 mg capsule Take 1 capsule (25 mg total) by mouth every 6 (six) hours as needed for up to 8 days for itching. 01/26/19 02/25/28  Dahal, Marlowe Aschoff, MD  DULoxetine (CYMBALTA) 30 MG capsule Take 2 capsules (60 mg total) by mouth daily. Patient taking differently: Take 30 mg by mouth daily.  03/12/19   Mariel Aloe, MD  hydrocortisone (ANUSOL-HC) 2.5 % rectal cream Place 1 application rectally 2 (two) times daily. 04/03/19   Danford, Suann Larry, MD  hydrOXYzine (ATARAX/VISTARIL) 25 MG tablet Take 1 tablet (25 mg total) by mouth 3 (three) times daily as needed for itching or anxiety. 02/26/19   Roxan Hockey, MD  insulin aspart (NOVOLOG) 100 UNIT/ML injection 0-9 Units, Subcutaneous, 3 times daily with meals CBG < 70: implement hypoglycemia protocol  CBG 70 - 120: 0 units CBG 121 - 150: 1 unit CBG 151 - 200: 2 units CBG 201 - 250: 3 units CBG 251 - 300: 5 units CBG 301 - 350: 7 units CBG 351 - 400: 9 units CBG > 400: call MD 01/26/19   Terrilee Croak, MD  insulin glargine (LANTUS) 100 UNIT/ML injection Inject 24 Units into the skin at bedtime.    [provider]  labetalol (NORMODYNE) 200 MG tablet Take 1 tablet (200 mg total)  by mouth 2 (two) times daily. Patient not taking: Reported on 04/01/2019 02/26/19 03/28/19  Roxan Hockey, MD  lidocaine-prilocaine (EMLA) cream Apply 1 application topically See admin instructions. On dialysis days 12/23/18   [provider]  methocarbamol (ROBAXIN) 500 MG tablet Take 500 mg by mouth daily as needed for muscle spasms.    [provider]  mupirocin ointment (BACTROBAN) 2 % Apply 1 application topically as needed (on arm). 04/03/19   Danford, Suann Larry, MD  Oxycodone HCl 10 MG TABS Take 1 tablet (10 mg total) by mouth 4 (four) times daily. 04/03/19   Danford, Suann Larry, MD  oxymetazoline (AFRIN) 0.05 % nasal spray Place 2 sprays into both nostrils 2 (two) times daily. 01/26/19   Terrilee Croak, MD  pantoprazole (PROTONIX) 40 MG tablet Take 1 tablet (40 mg total) by mouth daily at 6 (six) AM for 30 days. 01/26/19 04/01/19  Terrilee Croak, MD  Pramoxine-Menthol-Petrolatum (SARNA ULTRA EX) Apply 1 application topically daily as needed (itch).    [provider]  senna-docusate (SENOKOT-S) 8.6-50 MG tablet Take 2 tablets by mouth at bedtime. 02/26/19 02/26/20  Roxan Hockey, MD  traZODone (DESYREL) 50 MG tablet Take 0.5 tablets (25 mg total) by mouth at bedtime. 02/26/19 04/01/19  Roxan Hockey, MD    Family History Family History  Problem Relation Age of Onset   Hypertension Mother    Diabetes Mother    Hypertension Father    Diabetes Father     Social History Social History   Tobacco Use   Smoking status: Current Some Day Smoker    Packs/day: 1.00     Years: 36.00    Pack years: 36.00    Types: Cigarettes   Smokeless tobacco: Never Used   Tobacco comment: 5 cigs in one setting  Substance Use Topics   Alcohol use: Yes    Alcohol/week: 3.0 standard drinks    Types: 3 Standard drinks or equivalent per week    Frequency: Never    Comment: last use about 2 weeks ago   Drug use: Not Currently    Types: "Crack" cocaine, Cocaine    Comment: last use >2 years ago     Allergies   Penicillins, Gabapentin, Lyrica [pregabalin], Saphris [asenapine], Tramadol, Adhesive [tape], Latex, and Other   Review of Systems Review of Systems  Constitutional: Positive for fatigue. Negative for chills and fever.  Respiratory: Positive for shortness of breath.   Cardiovascular: Positive for chest pain.  Gastrointestinal: Positive for diarrhea, nausea and vomiting. Negative for abdominal pain and blood in stool.  Musculoskeletal: Negative for myalgias.  Neurological: Negative for dizziness and syncope.  All other systems reviewed and are negative.    Physical Exam Updated Vital Signs BP (!) 154/71    Pulse 77    Temp 98.6 F (37 C) (Oral)    Resp 14    LMP  (LMP Unknown)    SpO2 100%   Physical Exam Vitals signs and nursing note reviewed.  Constitutional:      General: She is not in acute distress.    Appearance: She is well-developed. She is not toxic-appearing.  HENT:     Head: Normocephalic and atraumatic.  Eyes:     General:        Right eye: No discharge.        Left eye: No discharge.     Conjunctiva/sclera: Conjunctivae normal.  Neck:     Musculoskeletal: Neck supple.  Cardiovascular:     Rate and Rhythm: Normal  rate and regular rhythm.  Pulmonary:     Effort: No respiratory distress.     Breath sounds: Decreased breath sounds (bibasilar) present. No wheezing, rhonchi or rales.     Comments: SpO2 98% on RA @ rest.  Abdominal:     General: There is no distension.     Palpations: Abdomen is soft.     Tenderness: There is no  abdominal tenderness. There is no guarding or rebound.  Musculoskeletal:     Comments: LUE fistula w/ palpable thrill.   Skin:    General: Skin is warm and dry.  Neurological:     Mental Status: She is alert.     Comments: Clear speech.   Psychiatric:        Behavior: Behavior normal.    ED Treatments / Results  Labs (all labs ordered are listed, but only abnormal results are displayed) Labs Reviewed  CBC WITH DIFFERENTIAL/PLATELET - Abnormal; Notable for the following components:      Result Value   WBC 3.7 (*)    RBC 1.78 (*)    Hemoglobin 5.7 (*)    HCT 18.0 (*)    MCV 101.1 (*)    Platelets 114 (*)    Lymphs Abs 0.5 (*)    All other components within normal limits  COMPREHENSIVE METABOLIC PANEL - Abnormal; Notable for the following components:   Sodium 128 (*)    Chloride 83 (*)    CO2 19 (*)    Glucose, Bld 380 (*)    BUN 155 (*)    Creatinine, Ser 13.30 (*)    Calcium 8.2 (*)    Albumin 3.4 (*)    AST 43 (*)    ALT 63 (*)    GFR calc non Af Amer 3 (*)    GFR calc Af Amer 3 (*)    Anion gap 26 (*)    All other components within normal limits  LIPASE, BLOOD - Abnormal; Notable for the following components:   Lipase 66 (*)    All other components within normal limits  CBG MONITORING, ED - Abnormal; Notable for the following components:   Glucose-Capillary 380 (*)    All other components within normal limits  POC OCCULT BLOOD, ED - Abnormal; Notable for the following components:   Fecal Occult Bld POSITIVE (*)    All other components within normal limits  TROPONIN I (HIGH SENSITIVITY) - Abnormal; Notable for the following components:   Troponin I (High Sensitivity) 39 (*)    All other components within normal limits  SARS CORONAVIRUS 2 BY RT PCR (HOSPITAL ORDER, Loop LAB)  NO BLOOD PRODUCTS  TYPE AND SCREEN  TROPONIN I (HIGH SENSITIVITY)    EKG EKG Interpretation  Date/Time:  Thursday May 13 2019 16:44:11  EDT Ventricular Rate:  77 PR Interval:    QRS Duration: 104 QT Interval:  446 QTC Calculation: 505 R Axis:   43 Text Interpretation:  Sinus rhythm Probable left atrial enlargement LVH with secondary repolarization abnormality Borderline prolonged QT interval When compared to prior, similar long QTC.  similar sharp t eaves.  No STEMI Confirmed by Antony Blackbird [phone]) on 05/13/2019 5:14:58 PM   Radiology Dg Abd Acute W/chest  Result Date: 05/13/2019 CLINICAL DATA:  Chest pain, shortness of breath, nausea, and vomiting. EXAM: DG ABDOMEN ACUTE W/ 1V CHEST COMPARISON:  CT abdomen pelvis dated April 02, 2019. Chest x-ray dated April 01, 2019. FINDINGS: Unchanged moderate cardiomegaly. Normal pulmonary vascularity. No focal consolidation,  pleural effusion, or pneumothorax. There is no evidence of dilated bowel loops or free intraperitoneal air. No radiopaque calculi or other significant radiographic abnormality is seen. No acute osseous abnormality. IMPRESSION: 1. No acute findings.  No active cardiopulmonary disease. 2. Unchanged moderate cardiomegaly. Electronically Signed   By: Titus Dubin M.D.   On: 05/13/2019 18:30    Procedures .Critical Care Performed by: Amaryllis Dyke, PA-C Authorized by: Amaryllis Dyke, PA-C      CRITICAL CARE Performed by: Kennith Maes   Total critical care time: 45 minutes  Critical care time was exclusive of separately billable procedures and treating other patients.  Critical care was necessary to treat or prevent imminent or life-threatening deterioration.  Critical care was time spent personally by me on the following activities: development of treatment plan with patient and/or surrogate as well as nursing, discussions with consultants, evaluation of patient's response to treatment, examination of patient, obtaining history from patient or surrogate, ordering and performing treatments and interventions, ordering and review of  laboratory studies, ordering and review of radiographic studies, pulse oximetry and re-evaluation of patient's condition.  (including critical care time)  Medications Ordered in ED Medications  insulin regular, human (MYXREDLIN) 100 units/ 100 mL infusion (has no administration in time range)     Initial Impression / Assessment and Plan / ED Course  I have reviewed the triage vital signs and the nursing notes.  Pertinent labs & imaging results that were available during my care of the patient were reviewed by me and considered in my medical decision making (see chart for details).   Patient with ESRD on dialysis who has missed last several dialysis session presents to the emergency department with multiple complaints including N/V/D, dyspnea, and chest discomfort.  Patient nontoxic-appearing, no apparent distress, vitals WNL with exception of elevated blood pressure, doubt HTN emergency.  She did not appear in respiratory distress.  Her abdomen is soft and nontender without peritoneal signs.  Plan for labs, x-ray, EKG with cardiac monitoring.  EKG: No STEMI, similar QT prolongation and sharp T waves when compared to prior EKGs. CBG 380 CBC w/ diff: Leukopenia similar to prior.  Critical anemia with hemoglobin of 5.7 hematocrit 18.0.  Platelets pending CMP: Multiple electrolyte abnormalities as above.  Hyperglycemia with mildly low bicarb and elevated anion gap.  LFTs slightly more elevated compared to prior.   Lipase: Elevated similar to prior Troponin: Mildly elevated similar to prior Chest/abdominal xray: 1. No acute findings.  No active cardiopulmonary disease. 2. Unchanged moderate cardiomegaly  Patient with critical anemia with hemoglobin 5.7 and hematocrit of 18.0, on chart review she has had multiple admissions for GI bleeding including August 2020 where she had a negative CT abdomen pelvis and was ultimately discharged home, again in June 2020 during which she had a flex  sigmoidoscopy that showed dark stool in the rectum and in the sigmoid colon, minimally burgundy tinged stool and mucosa, but no frank bloodor obvious source of bleeding.Capsule endoscopy negative as well. Had an upper endoscopy most recently April 2020 which showed normal esophagus, mild gastritis with no evidence of bleeding, and mild duodenitis with no evidence of bleeding.  Critical anemia, extensive hx of GI bleeding as discussed above, DRE w/ melena- heme positive. 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  Risks/benefits including possibility of death with continued bleeding and no transfusion were discussed, patient is alert and oriented, patient continues to refuse blood. Initially holding off on  protonix given prolonged QTc. Patient denies NSAID/blood thinner use. Denies daily EtOH, reports occassional.   Concern for DKA w/ hyperglycemia, low bicarb, & elevated anion gap.   Will discuss w/ nephrology in regards to symptomatic anemia, dialysis, & if DKA regimen needs to be altered in this ESRD patient. Will discuss w/ gastroenterology regarding melena & hgb/hct. Ultimately will require hospitalist admission.   20:26: CONSULT: Discussed w/ nephrologist Dr. Lynder Parents team will see patient in consultation with plan for dialysis tomorrow morning if she is able to tolerate it.  In regards to her DKA management would not give her fluids, and would give insulin drip at a lower rate/amount if possible.   21:00: CONSULT: Discussed with hospitalist Dr. Hal Hope- in agreement with starting insulin drip per glucostabilizer and he states he will recheck BMP, accepts admission.   Discussed w/ ED pharmacy tech to verify insulin drip orders OK with dialysis- confirmed.   22:15: CONSULT; Discussed w/ gastroenterologist Dr. Havery Moros- will see patient in AM, protonix if possible, pepcid if possible as alternative if no protonix- in agreement w/ discussion w/  pharmacy in this regard given QTc prolongation.   Discussed w/ pharmacy regarding protonix given prolonged QTc- relayed this was Ok to give, she has previously received protonix w/ similar QTcs on chart review- start bolus & drip.   Findings and plan of care discussed with supervising physician Dr. Sherry Ruffing who is in agreement.    ELNER SEIFERT was evaluated in Emergency Department on 05/13/2019 for the symptoms described in the history of present illness. He/she was evaluated in the context of the global COVID-19 pandemic, which necessitated consideration that the patient might be at risk for infection with the SARS-CoV-2 virus that causes COVID-19. Institutional protocols and algorithms that pertain to the evaluation of patients at risk for COVID-19 are in a state of rapid change based on information released by regulatory bodies including the CDC and federal and state organizations. These policies and algorithms were followed during the patient's care in the ED.   Final Clinical Impressions(s) / ED Diagnoses   Final diagnoses:  Symptomatic anemia  Diabetic ketoacidosis without coma associated with type 2 diabetes mellitus ([place])  ESRD (end stage renal disease) [facility])    ED Discharge Orders    None       Amaryllis Dyke, PA-C 05/13/19 2227    Amaryllis Dyke, PA-C 05/13/19 2245    Tegeler, Gwenyth Allegra, MD 05/13/19 2359     Amaryllis Dyke, PA-C 05/14/19 0006    Tegeler, Gwenyth Allegra, MD 05/14/19 [phone]

## 2019-05-13 NOTE — H&P (Signed)
History and Physical    [name] RKY:[id number] DOB: 16-Feb-1966 DOA: 05/13/2019  PCP: Rocco Serene, MD  Patient coming from: Home.  Chief Complaint: Shortness of breath.  HPI: [name] is a 53 y.o. female with history of ESRD on hemodialysis, hypertension, diabetes mellitus, recurrent GI bleed admitted in August 2020 with GI bleed presents to the ER because of shortness of breath and had missed her dialysis for last 1 week.  Patient states that over the last 5 days patient also has been nausea vomiting and diarrhea.  And left flank pain.  Denies any fever chills productive cough has had some chest discomfort on throwing up.  ED Course: In the ER patient was noticed to have hemoglobin of 5.7 which is a drop of 3 g from August at the time it was 8.3.  Patient is 114.  This is unremarkable.  Metabolic panel shows blood glucose of 380 bicarb 19 anion gap 26.  Stool for occult blood was positive and was melanotic.  COVID-19 test is negative.  On-call nephrologist was consulted.  Given the GI bleed on-call gastroenterology Dr. Havery Moros was also notified advised to start patient on Protonix infusion.  Patient is Jehovah's Witness and at this time is refused blood transfusion but during previous admission has agreed for transfusion.  Given the DKA picture patient was started on insulin infusion without any IV fluid bolus.  On exam patient has mild tenderness of the left lower quadrant for which CT abdomen and lactic acid has been ordered.  Review of Systems: As per HPI, rest all negative.   Past Medical History:  Diagnosis Date  . Anemia   . Anxiety   . Bipolar disorder, unspecified ([place]) 02/06/2014  . Chronic combined systolic and diastolic CHF (congestive heart failure) (HCC)    EF 40% by echo and 48% by stress test  . Chronic pain syndrome   . Depression   . ESRD on hemodialysis (HCC)    TTS  . GERD (gastroesophageal reflux disease)   . Glaucoma   . Headache    sinus headaches   . Hepatitis C   . High cholesterol   . History of hiatal hernia   . History of vitamin D deficiency 06/20/2015  . Hypertension   . MRSA infection ?2014   "on the back of my head; spread throughout my bloodstream"  . Neuropathy   . Refusal of blood transfusions as patient is Jehovah's Witness    occasionally allows transfusions.    . Schizoaffective disorder, unspecified condition 02/08/2014  . Seizure [facility]) dx'd 2014   "don't know what kind; last one was ~ 04/2014"  . TIA (transient ischemic attack) 2010  . Type II diabetes mellitus ([place])    INSULIN DEPENDENT    Past Surgical History:  Procedure Laterality Date  . ABDOMINAL HYSTERECTOMY  2014  . ABDOMINAL HYSTERECTOMY  2010  . AMPUTATION Left 05/21/2013   Procedure: Left Midfoot AMPUTATION;  Surgeon: Newt Minion, MD;  Location: [place];  Service: Orthopedics;  Laterality: Left;  Left Midfoot Amputation  . AV FISTULA PLACEMENT Left 05/04/2015   Procedure: ARTERIOVENOUS (AV) GRAFT INSERTION;  Surgeon: Angelia Mould, MD;  Location: [place];  Service: Vascular;  Laterality: Left;  . BIOPSY  11/18/2018   Procedure: BIOPSY;  Surgeon: Jerene Bears, MD;  Location: [place];  Service: Gastroenterology;;  . COLONOSCOPY WITH PROPOFOL N/A 06/22/2013   Procedure: COLONOSCOPY WITH PROPOFOL;  Surgeon: Jeryl Columbia, MD;  Location: [facility]  ENDOSCOPY;  Service: Endoscopy;  Laterality: N/A;  . COLONOSCOPY WITH PROPOFOL N/A 11/18/2018   Procedure: COLONOSCOPY WITH PROPOFOL;  Surgeon: Jerene Bears, MD;  Location: [place];  Service: Gastroenterology;  Laterality: N/A;  . ENTEROSCOPY N/A 12/31/2018   Procedure: ENTEROSCOPY;  Surgeon: Irene Shipper, MD;  Location: [facility] ENDOSCOPY;  Service: Endoscopy;  Laterality: N/A;  Push enteroscopy at 8am (spoke with Endo)  . ESOPHAGOGASTRODUODENOSCOPY N/A 05/11/2015   Procedure: ESOPHAGOGASTRODUODENOSCOPY (EGD);  Surgeon: Laurence Spates, MD;  Location: [facility] ENDOSCOPY;  Service: Endoscopy;  Laterality: N/A;  .  ESOPHAGOGASTRODUODENOSCOPY (EGD) WITH PROPOFOL N/A 11/18/2018   Procedure: ESOPHAGOGASTRODUODENOSCOPY (EGD) WITH PROPOFOL;  Surgeon: Jerene Bears, MD;  Location: [facility] ENDOSCOPY;  Service: Gastroenterology;  Laterality: N/A;  . EYE SURGERY Bilateral 2010   Lasik  . FLEXIBLE SIGMOIDOSCOPY N/A 01/17/2017   Procedure: FLEXIBLE SIGMOIDOSCOPY w/ hemorrhoid banding possible;  Surgeon: Gatha Mayer, MD;  Location: [facility] ENDOSCOPY;  Service: Endoscopy;  Laterality: N/A;  . FLEXIBLE SIGMOIDOSCOPY N/A 01/21/2019   Procedure: FLEXIBLE SIGMOIDOSCOPY;  Surgeon: Ronald Lobo, MD;  Location: [facility] ENDOSCOPY;  Service: Endoscopy;  Laterality: N/A;  Plan to do the test unprepped, unsedated  . FOOT AMPUTATION Left    due diabetic neuropathy, could not feel ulcer on bottom of foot  . GIVENS CAPSULE STUDY N/A 01/21/2019   Procedure: GIVENS CAPSULE STUDY;  Surgeon: Ronald Lobo, MD;  Location: [facility] ENDOSCOPY;  Service: Endoscopy;  Laterality: N/A;  . POLYPECTOMY  11/18/2018   Procedure: POLYPECTOMY;  Surgeon: Jerene Bears, MD;  Location[facility] ENDOSCOPY;  Service: Gastroenterology;;  . REFRACTIVE SURGERY Bilateral 2013  . TONSILLECTOMY  1970's  . TUBAL LIGATION  2000     reports that she has been smoking cigarettes. She has a 36.00 pack-year smoking history. She has never used smokeless tobacco. She reports current alcohol use of about 3.0 standard drinks of alcohol per week. She reports previous drug use. Drugs: "Crack" cocaine and Cocaine.  Allergies  Allergen Reactions  . Penicillins Hives and Other (See Comments)    Has patient had a PCN reaction causing immediate rash, facial/tongue/throat swelling, SOB or lightheadedness with hypotension: Yes Has patient had a PCN reaction causing severe rash involving mucus membranes or skin necrosis: No Has patient had a PCN reaction that required hospitalization No Has patient had a PCN reaction occurring within the last 10 years: No If all of the above answers are "NO",  then may proceed with Cephalosporin use. Pt tolerates cefepime and ceftriaxone  . Gabapentin Itching and Swelling  . Lyrica [Pregabalin] Swelling and Other (See Comments)    Facial and leg swelling  . Saphris [Asenapine] Swelling and Other (See Comments)    Facial swelling  . Tramadol Swelling and Other (See Comments)    Leg swelling  . Adhesive [Tape] Itching    SEVERE ITCHING  . Latex Itching    SEVERE ITCHING  . Other Other (See Comments)    NO BLOOD PRODUCTS; PATIENT IS A JEHOVAH'S WITNESS    Family History  Problem Relation Age of Onset  . Hypertension Mother   . Diabetes Mother   . Hypertension Father   . Diabetes Father     Prior to Admission medications   Medication Sig Start Date End Date Taking? Authorizing Provider  acetaminophen (TYLENOL) 325 MG tablet Take 2 tablets (650 mg total) by mouth every 6 (six) hours as needed for fever, headache or moderate pain. 02/26/19  Yes Emokpae, Courage, MD  albuterol (PROVENTIL) (2.5 MG/3ML) 0.083% nebulizer solution  Take 5 mg by nebulization daily as needed for wheezing or shortness of breath.   Yes [provider]  albuterol (VENTOLIN HFA) 108 (90 Base) MCG/ACT inhaler Inhale 2 puffs into the lungs every 4 (four) hours as needed for wheezing or shortness of breath. 01/26/19  Yes Dahal, Marlowe Aschoff, MD  ALPRAZolam (XANAX) 0.5 MG tablet Take 0.5 mg by mouth 3 (three) times daily as needed for anxiety. 02/16/19  Yes [provider]  amLODipine (NORVASC) 2.5 MG tablet Take 2.5 mg by mouth daily. 03/16/19  Yes [provider]  atorvastatin (LIPITOR) 40 MG tablet Take 1 tablet (40 mg total) by mouth at bedtime. 02/26/19  Yes Roxan Hockey, MD  Darbepoetin Alfa (ARANESP) 200 MCG/0.4ML SOSY injection Inject 0.4 mLs (200 mcg total) into the vein every Wednesday with hemodialysis. 01/27/19  Yes Dahal, Marlowe Aschoff, MD  diphenhydrAMINE (BENADRYL) 25 mg capsule Take 1 capsule (25 mg total) by mouth every 6 (six) hours as needed for  up to 8 days for itching. 01/26/19 02/25/28 Yes Dahal, Marlowe Aschoff, MD  furosemide (LASIX) 80 MG tablet Take 80 mg by mouth 2 (two) times daily.   Yes [provider]  hydrocortisone (ANUSOL-HC) 2.5 % rectal cream Place 1 application rectally 2 (two) times daily. 04/03/19  Yes Danford, Suann Larry, MD  hydrOXYzine (ATARAX/VISTARIL) 25 MG tablet Take 1 tablet (25 mg total) by mouth 3 (three) times daily as needed for itching or anxiety. 02/26/19  Yes Emokpae, Courage, MD  insulin aspart (NOVOLOG) 100 UNIT/ML injection 0-9 Units, Subcutaneous, 3 times daily with meals CBG < 70: implement hypoglycemia protocol CBG 70 - 120: 0 units CBG 121 - 150: 1 unit CBG 151 - 200: 2 units CBG 201 - 250: 3 units CBG 251 - 300: 5 units CBG 301 - 350: 7 units CBG 351 - 400: 9 units CBG > 400: call MD Patient taking differently: Inject 0-9 Units into the skin 3 (three) times daily with meals. 0-9 Units, Subcutaneous, 3 times daily with meals CBG < 70: implement hypoglycemia protocol CBG 70 - 120: 0 units CBG 121 - 150: 1 unit CBG 151 - 200: 2 units CBG 201 - 250: 3 units CBG 251 - 300: 5 units CBG 301 - 350: 7 units CBG 351 - 400: 9 units CBG > 400: call MD 01/26/19  Yes Dahal, Marlowe Aschoff, MD  insulin glargine (LANTUS) 100 UNIT/ML injection Inject 24 Units into the skin at bedtime.   Yes [provider]  lidocaine-prilocaine (EMLA) cream Apply 1 application topically See admin instructions. On dialysis days 12/23/18  Yes [provider]  methocarbamol (ROBAXIN) 500 MG tablet Take 500 mg by mouth daily as needed for muscle spasms.   Yes [provider]  mupirocin ointment (BACTROBAN) 2 % Apply 1 application topically as needed (on arm). 04/03/19  Yes Danford, Suann Larry, MD  Oxycodone HCl 10 MG TABS Take 1 tablet (10 mg total) by mouth 4 (four) times daily. 04/03/19  Yes Danford, Suann Larry, MD  oxymetazoline (AFRIN) 0.05 % nasal spray Place 2 sprays into both nostrils 2 (two) times daily. 01/26/19   Yes Dahal, Marlowe Aschoff, MD  pantoprazole (PROTONIX) 40 MG tablet Take 1 tablet (40 mg total) by mouth daily at 6 (six) AM for 30 days. 01/26/19 05/13/19 Yes Dahal, Marlowe Aschoff, MD  Pramoxine-Menthol-Petrolatum (SARNA ULTRA EX) Apply 1 application topically daily as needed (itch).   Yes [provider]  senna-docusate (SENOKOT-S) 8.6-50 MG tablet Take 2 tablets by mouth at bedtime. 02/26/19 02/26/20 Yes Emokpae,  Courage, MD  traZODone (DESYREL) 50 MG tablet Take 0.5 tablets (25 mg total) by mouth at bedtime. 02/26/19 05/13/19 Yes Emokpae, Courage, MD  allopurinol (ZYLOPRIM) 100 MG tablet Take 2 tablets (200 mg total) by mouth daily. 02/26/19 04/01/19  Roxan Hockey, MD    Physical Exam: Constitutional: Moderately built and nourished. Vitals:   05/13/19 2145 05/13/19 2215 05/13/19 2230 05/13/19 2245  BP: (!) 146/79 135/75 (!) 149/81 (!) 149/75  Pulse:  74 76 76  Resp: 12 12 13 13   Temp:      TempSrc:      SpO2:  96% 100% 100%   Eyes: Anicteric no pallor. ENMT: No discharge from the ears eyes nose or mouth. Neck: No mass felt.  No neck rigidity. Respiratory: No rhonchi or crepitations. Cardiovascular: S1-S2 heard. Abdomen: Soft mild left lower quadrant tenderness no guarding or rigidity. Musculoskeletal: No edema. Skin: No rash. Neurologic: Alert awake oriented to time place and person.  Moves all extremities. Psychiatric: Appears normal.   Labs on Admission: I have personally reviewed following labs and imaging studies  CBC: Recent Labs  Lab 05/13/19 1812  WBC 3.7*  NEUTROABS 2.9  HGB 5.7*  HCT 18.0*  MCV 101.1*  PLT 073*   Basic Metabolic Panel: Recent Labs  Lab 05/13/19 1812  NA 128*  K 5.0  CL 83*  CO2 19*  GLUCOSE 380*  BUN 155*  CREATININE 13.30*  CALCIUM 8.2*   GFR: CrCl cannot be calculated (Unknown ideal weight.). Liver Function Tests: Recent Labs  Lab 05/13/19 1812  AST 43*  ALT 63*  ALKPHOS 93  BILITOT 1.2  PROT 6.9  ALBUMIN 3.4*   Recent Labs   Lab 05/13/19 1812  LIPASE 66*   No results for input(s): AMMONIA in the last 168 hours. Coagulation Profile: No results for input(s): INR, PROTIME in the last 168 hours. Cardiac Enzymes: No results for input(s): CKTOTAL, CKMB, CKMBINDEX, TROPONINI in the last 168 hours. BNP (last 3 results) No results for input(s): PROBNP in the last 8760 hours. HbA1C: No results for input(s): HGBA1C in the last 72 hours. CBG: Recent Labs  Lab 05/13/19 1653  GLUCAP 380*   Lipid Profile: No results for input(s): CHOL, HDL, LDLCALC, TRIG, CHOLHDL, LDLDIRECT in the last 72 hours. Thyroid Function Tests: No results for input(s): TSH, T4TOTAL, FREET4, T3FREE, THYROIDAB in the last 72 hours. Anemia Panel: No results for input(s): VITAMINB12, FOLATE, FERRITIN, TIBC, IRON, RETICCTPCT in the last 72 hours. Urine analysis:    Component Value Date/Time   COLORURINE YELLOW 02/25/2017 1224   APPEARANCEUR HAZY (A) 02/25/2017 1224   LABSPEC 1.013 02/25/2017 1224   PHURINE 8.0 02/25/2017 1224   GLUCOSEU >=500 (A) 02/25/2017 1224   HGBUR SMALL (A) 02/25/2017 1224   BILIRUBINUR NEGATIVE 02/25/2017 1224   BILIRUBINUR neg 12/13/2014 1127   KETONESUR NEGATIVE 02/25/2017 1224   PROTEINUR >=300 (A) 02/25/2017 1224   UROBILINOGEN 0.2 05/01/2015 1252   NITRITE NEGATIVE 02/25/2017 1224   LEUKOCYTESUR NEGATIVE 02/25/2017 1224   Sepsis Labs: @LABRCNTIP (procalcitonin:4,lacticidven:4) )No results found for this or any previous visit (from the past 240 hour(s)).   Radiological Exams on Admission: Dg Abd Acute W/chest  Result Date: 05/13/2019 CLINICAL DATA:  Chest pain, shortness of breath, nausea, and vomiting. EXAM: DG ABDOMEN ACUTE W/ 1V CHEST COMPARISON:  CT abdomen pelvis dated April 02, 2019. Chest x-ray dated April 01, 2019. FINDINGS: Unchanged moderate cardiomegaly. Normal pulmonary vascularity. No focal consolidation, pleural effusion, or pneumothorax. There is no evidence of dilated bowel loops  or free  intraperitoneal air. No radiopaque calculi or other significant radiographic abnormality is seen. No acute osseous abnormality. IMPRESSION: 1. No acute findings.  No active cardiopulmonary disease. 2. Unchanged moderate cardiomegaly. Electronically Signed   By: Titus Dubin M.D.   On: 05/13/2019 18:30    EKG: Independently reviewed.  Normal sinus rhythm with nonspecific ST changes.  Assessment/Plan Principal Problem:   Acute GI bleeding Active Problems:   Seizure disorder (HCC)   ESRD (end stage renal disease) (HCC)   Prolonged QT interval   Acute blood loss anemia   DKA (diabetic ketoacidoses) ([place])    1. Acute GI bleeding given the melanotic stool patient is suspected to have upper GI bleed.  Patient has had extensive GI study this year with EGD colonoscopy and enteroscopy capsule study.  Presently patient is on Protonix infusion and they will try to minimize blood draw given the patient is Jehovah's Witness and refusing transfusion at this time.  Other patient did agree for transfusion during admission August 2020 2 months ago.  We will get a CT abdomen pelvis and lactate level since patient has abdominal tenderness of the left lower quadrant. 2. Acute respiratory failure with hypoxia secondary to patient missing dialysis.  Nephrology has been consulted for dialysis. 3. Diabetic ketoacidosis on IV insulin infusion.  Follow metabolic panel. 4. Hypertension we will keep patient on PRN IV hydralazine. 5. Prolonged QT interval try to avoid QT prolonging medications. 6. Acute blood loss anemia follow CBC.  Patient states she has skin itching and was exposed to scabies.  For which patient is placed on contact precautions and permethrin has been ordered.  Given that patient has acute GI bleeding with possible DKA and fluid overload patient will need more than 2 midnight stay and inpatient status.   DVT prophylaxis: SCDs due to GI bleeding. Code Status: Full code. Family Communication:  Discussed with patient. Disposition Plan: Home. Consults called: Nephrology and GI. Admission status: Inpatient.   Rise Patience MD [facility] Pager [phone].  If 7PM-7AM, please contact night-coverage [email or web address] Password [email or web address]  05/13/2019, 10:51 PM

## 2019-05-13 NOTE — ED Notes (Signed)
Pt to xray at this time.

## 2019-05-13 NOTE — ED Triage Notes (Signed)
Pt arrives from home by EMS with complaints of SOB X 3 days. Chest pain endorses only today. Pt has missed HD and last dialysis last Wednesday. Pt states she has been unable to take her insulin.  CBG with EMS 434 154/80 P: 84 RR18 97% RA

## 2019-05-13 NOTE — ED Notes (Signed)
Refusal of blood and/or Blood products signed by pt.

## 2019-05-13 NOTE — ED Notes (Signed)
CBG 380

## 2019-05-14 ENCOUNTER — Inpatient Hospital Stay (HOSPITAL_COMMUNITY): Payer: Medicare Other

## 2019-05-14 DIAGNOSIS — D649 Anemia, unspecified: Secondary | ICD-10-CM

## 2019-05-14 DIAGNOSIS — E1110 Type 2 diabetes mellitus with ketoacidosis without coma: Secondary | ICD-10-CM

## 2019-05-14 LAB — RENAL FUNCTION PANEL
Albumin: 2.9 g/dL — ABNORMAL LOW (ref 3.5–5.0)
Anion gap: 27 — ABNORMAL HIGH (ref 5–15)
BUN: 158 mg/dL — ABNORMAL HIGH (ref 6–20)
CO2: 16 mmol/L — ABNORMAL LOW (ref 22–32)
Calcium: 7.8 mg/dL — ABNORMAL LOW (ref 8.9–10.3)
Chloride: 89 mmol/L — ABNORMAL LOW (ref 98–111)
Creatinine, Ser: 13.53 mg/dL — ABNORMAL HIGH (ref 0.44–1.00)
GFR calc Af Amer: 3 mL/min — ABNORMAL LOW (ref >60)
GFR calc non Af Amer: 3 mL/min — ABNORMAL LOW (ref >60)
Glucose, Bld: 158 mg/dL — ABNORMAL HIGH (ref 70–99)
Phosphorus: 10.2 mg/dL — ABNORMAL HIGH (ref 2.5–4.6)
Potassium: 4.8 mmol/L (ref 3.5–5.1)
Sodium: 132 mmol/L — ABNORMAL LOW (ref 135–145)

## 2019-05-14 LAB — GLUCOSE, CAPILLARY
Glucose-Capillary: 110 mg/dL — ABNORMAL HIGH (ref 70–99)
Glucose-Capillary: 113 mg/dL — ABNORMAL HIGH (ref 70–99)
Glucose-Capillary: 119 mg/dL — ABNORMAL HIGH (ref 70–99)
Glucose-Capillary: 160 mg/dL — ABNORMAL HIGH (ref 70–99)
Glucose-Capillary: 166 mg/dL — ABNORMAL HIGH (ref 70–99)
Glucose-Capillary: 175 mg/dL — ABNORMAL HIGH (ref 70–99)
Glucose-Capillary: 191 mg/dL — ABNORMAL HIGH (ref 70–99)
Glucose-Capillary: 253 mg/dL — ABNORMAL HIGH (ref 70–99)
Glucose-Capillary: 273 mg/dL — ABNORMAL HIGH (ref 70–99)
Glucose-Capillary: 306 mg/dL — ABNORMAL HIGH (ref 70–99)
Glucose-Capillary: 321 mg/dL — ABNORMAL HIGH (ref 70–99)
Glucose-Capillary: 74 mg/dL (ref 70–99)

## 2019-05-14 LAB — CBG MONITORING, ED
Glucose-Capillary: 227 mg/dL — ABNORMAL HIGH (ref 70–99)
Glucose-Capillary: 197 mg/dL — ABNORMAL HIGH (ref 70–99)
Glucose-Capillary: 196 mg/dL — ABNORMAL HIGH (ref 70–99)
Glucose-Capillary: 66 mg/dL — ABNORMAL LOW (ref 70–99)
Glucose-Capillary: 82 mg/dL (ref 70–99)
Glucose-Capillary: 104 mg/dL — ABNORMAL HIGH (ref 70–99)
Glucose-Capillary: 124 mg/dL — ABNORMAL HIGH (ref 70–99)
Glucose-Capillary: 135 mg/dL — ABNORMAL HIGH (ref 70–99)

## 2019-05-14 LAB — PREPARE RBC (CROSSMATCH)

## 2019-05-14 LAB — CBC
HCT: 15.2 % — ABNORMAL LOW (ref 36.0–46.0)
Hemoglobin: 4.7 g/dL — CL (ref 12.0–15.0)
MCH: 32 pg (ref 26.0–34.0)
MCHC: 30.9 g/dL (ref 30.0–36.0)
MCV: 103.4 fL — ABNORMAL HIGH (ref 80.0–100.0)
Platelets: 102 10*3/uL — ABNORMAL LOW (ref 150–400)
RBC: 1.47 MIL/uL — ABNORMAL LOW (ref 3.87–5.11)
RDW: 15.1 % (ref 11.5–15.5)
WBC: 3.9 10*3/uL — ABNORMAL LOW (ref 4.0–10.5)
nRBC: 0 % (ref 0.0–0.2)

## 2019-05-14 LAB — BETA-HYDROXYBUTYRIC ACID: Beta-Hydroxybutyric Acid: 0.88 mmol/L — ABNORMAL HIGH (ref 0.05–0.27)

## 2019-05-14 LAB — LACTIC ACID, PLASMA: Lactic Acid, Venous: 2.5 mmol/L (ref 0.5–1.9)

## 2019-05-14 MED ORDER — ACETAMINOPHEN 325 MG PO TABS
ORAL_TABLET | ORAL | Status: AC
  Filled 2019-05-14: qty 2

## 2019-05-14 MED ORDER — ALTEPLASE 2 MG IJ SOLR: 2.0000 mg | Freq: Once | INTRAMUSCULAR | Status: DC | PRN

## 2019-05-14 MED ORDER — PERMETHRIN 5 % EX CREA
TOPICAL_CREAM | Freq: Once | CUTANEOUS | Status: AC
  Administered 2019-05-14: 05:00:00 via TOPICAL
  Filled 2019-05-14: qty 60

## 2019-05-14 MED ORDER — HEPARIN SODIUM (PORCINE) 1000 UNIT/ML DIALYSIS: 1000.0000 [IU] | INTRAMUSCULAR | Status: DC | PRN

## 2019-05-14 MED ORDER — PANTOPRAZOLE SODIUM 40 MG PO TBEC: 40.0000 mg | DELAYED_RELEASE_TABLET | Freq: Every day | ORAL | Status: DC

## 2019-05-14 MED ORDER — SODIUM CHLORIDE 0.9 % IV SOLN: 100.0000 mL | INTRAVENOUS | Status: DC | PRN

## 2019-05-14 MED ORDER — LIDOCAINE-PRILOCAINE 2.5-2.5 % EX CREA: 1.0000 "application " | TOPICAL_CREAM | CUTANEOUS | Status: DC | PRN

## 2019-05-14 MED ORDER — LIDOCAINE HCL (PF) 1 % IJ SOLN: 5.0000 mL | INTRAMUSCULAR | Status: DC | PRN

## 2019-05-14 MED ORDER — ACETAMINOPHEN 325 MG PO TABS
650.0000 mg | ORAL_TABLET | Freq: Four times a day (QID) | ORAL | Status: DC | PRN
  Administered 2019-05-14 – 2019-05-16 (×7): 650 mg via ORAL
  Filled 2019-05-14 (×6): qty 2

## 2019-05-14 MED ORDER — PENTAFLUOROPROP-TETRAFLUOROETH EX AERO: 1.0000 "application " | INHALATION_SPRAY | CUTANEOUS | Status: DC | PRN

## 2019-05-14 MED ORDER — DEXTROSE 50 % IV SOLN
14.0000 mL | Freq: Once | INTRAVENOUS | Status: AC
  Administered 2019-05-14: 14 mL via INTRAVENOUS

## 2019-05-14 MED ORDER — SODIUM CHLORIDE 0.9% IV SOLUTION
Freq: Once | INTRAVENOUS | Status: AC
  Administered 2019-05-14: 18:00:00 via INTRAVENOUS

## 2019-05-14 MED ORDER — PANTOPRAZOLE SODIUM 40 MG IV SOLR
40.0000 mg | Freq: Two times a day (BID) | INTRAVENOUS | Status: DC
  Administered 2019-05-14 – 2019-05-15 (×4): 40 mg via INTRAVENOUS
  Filled 2019-05-14 (×4): qty 40

## 2019-05-14 NOTE — Consult Note (Addendum)
[facility] Consult: 8:21 AM 05/14/2019  LOS: 1 day    Referring Provider: Dr Wyline Copas  Primary Care Physician:  Rocco Serene, MD Primary Gastroenterologist:  [facility]    Reason for Consultation:  Recurrent anemia.     HPI: [name] is a 53 y.o. female.  Hx ESRD, sporadic compliance with hemodialysis.  IDDM.Marland Kitchen  Chronic anemia, blood loss anemia.  Jehovah's Witness who declines transfusion on most occasions..  Chronic thrombocytopenia.  DM.  Htn.  Cirrhosis with intermittent mild elevation LFTs.  Anxiety, substance abuse, schizoaffective disorder.  Recurrent episodes painless hematochezia, melena, severe anemia w Hgbs as low as 4. 06/2013 colonoscopy, Dr. Watt Climes.For hematochezia.Poor prep limited visualization, so lesions could have been missed however no fresh or old blood visualized. Found small internal and external hemorrhoids with a small associated, but non-bleeding,tear, likely source of the reported red blood. 05/11/15 EGD, Dr. Laurence Spates.For heme positive stool and right upper quadrant pain.Study unremarkable. Pain attributed to gallstones. Latest liver imaging: 05/2015 CT with contrast: liver normal.  On 07/2015 ultrasound liver texture "coarse" as previously noted.  01/2016 Flex Sig.  Dr Carlean Purl.  Diminished rectal sphincter tone.  Small ext hemorrhoids.  Multiple sigmoid diverticuli. 11/2018 EGD.  Dr. Hilarie Fredrickson.  Mild gastritis, no active bleeding.  Biopsy reactive gastropathy, no metaplasia or H. pylori.  Mild nonbleeding duodenitis 11/2018 colonoscopy.  Dr. Hilarie Fredrickson.  A total of 4 polyps removed from ascending, transverse, descending colon.  Size from 2 to 5 mm.  Sigmoid diverticulosis.  Hemorrhoids.  No active bleeding or stigmata of bleeding. Polyp pathology either benign colonic mucosa or  hyperplastic polyps.  12/31/2018 small bowel enteroscopy.  Dr. Scarlette Shorts.  Unremarkable push enteroscopy to the proximal jejunum.  No active bleeding or source for bleeding identified.  If additional bleeding occurred recommend CT angiography 01/21/19 flexible sigmoidoscopy.  Dr. Herbie Baltimore Buccini.  Perianal skin tags, no significant hemorrhoids.  Dark stool in rectum and sigmoid, minimally burgundy tinged stool and mucosa, no frank blood or active bleeding.  Recommend video capsule endoscopy today 01/21/2019 capsule endoscopy.  Dr. Herbie Baltimore Buccini.  Duodenum reached in 3 hours, cecum reached at 4 hours.  No blood or active bleeding noted anywhere on this 12-hour study 01/22/2019 CTAP w/o contrast.  No CT explanation for bleeding.  Mild, nonspecific wall thickening at GE junction.  Capsule seen in the descending colon.  Moderate colonic stool burden.  Uncomplicated cholelithiasis.  Aortic and branch atherosclerosis. Coronary calcifications.  Cardiomegaly.  Borderline mild hepatosplenomegaly.  Normal hepato-pedal flow.  01/25/2019 ultrasound with elastography liver, doppler: Cirrhotic liver, no focal hepatic lesions.  Small gallstones.  [place] fibrosis score some F3 + F4.  Fibrosis risk high no hepatic, portal, splenic vein thrombosis or occlusion.  Minimal ascites. 01/31/2019 CTAP angogram: No active GI bleeding.  Cirrhosis, small subhepatic ascites.  Cholelithiasis.  Moderate colonic stool burden.  Small bilateral pleural effusions/compressive atelectasis lower lobes.  Presented yesterday afternoon with a few days of nausea, vomiting, diarrhea.  No bloody emesis or stools.  Symptoms aggravated by p.o. intake.  No  fevers, no chills.  No sick contacts.  Developed shortness of breath, central chest discomfort.  Her last dialysis (she stayed only for 2 hours and 34 minutes, left 1.2 L above estimated dry weight) was on 05/05/2019, states she did not feel well enough to go to dialysis.  Had not taken her insulin. Hgb  5.7 >> 4.7.  Was 6.4 >> transfused to 8.3 in late August.  MCV 103.  Platelets 114 >> 102. WBCs 3.7. T bili and alk phos normal.  AST/ALT 43/63.  Lipase 66 Sodium 128.  BUN/creatinine 158/13.5.  Troponin high-sensitivity 39, 38. Acute abdominal series unremarkable.  This morning she is seen while on hemodialysis.  She is feeling better.  Her last episode of brown diarrhea was before noon yesterday.  No nausea or vomiting.  Abdominal pain improved.  Shortness of breath improved.  She would like to have a sandwich.     Past Medical History:  Diagnosis Date   Anemia    Anxiety    Bipolar disorder, unspecified ([place]) 02/06/2014   Chronic combined systolic and diastolic CHF (congestive heart failure) (HCC)    EF 40% by echo and 48% by stress test   Chronic pain syndrome    Depression    ESRD on hemodialysis (HCC)    TTS   GERD (gastroesophageal reflux disease)    Glaucoma    Headache    sinus headaches   Hepatitis C    High cholesterol    History of hiatal hernia    History of vitamin D deficiency 06/20/2015   Hypertension    MRSA infection ?2014   "on the back of my head; spread throughout my bloodstream"   Neuropathy    Refusal of blood transfusions as patient is Jehovah's Witness    occasionally allows transfusions.     Schizoaffective disorder, unspecified condition 02/08/2014   Seizure ([place]) dx'd 2014   "don't know what kind; last one was ~ 04/2014"   TIA (transient ischemic attack) 2010   Type II diabetes mellitus ([place])    INSULIN DEPENDENT    Past Surgical History:  Procedure Laterality Date   ABDOMINAL HYSTERECTOMY  2014   ABDOMINAL HYSTERECTOMY  2010   AMPUTATION Left 05/21/2013   Procedure: Left Midfoot AMPUTATION;  Surgeon: Newt Minion, MD;  Location: [place];  Service: Orthopedics;  Laterality: Left;  Left Midfoot Amputation   AV FISTULA PLACEMENT Left 05/04/2015   Procedure: ARTERIOVENOUS (AV) GRAFT INSERTION;  Surgeon: Angelia Mould, MD;  Location: [place];  Service: Vascular;  Laterality: Left;   BIOPSY  11/18/2018   Procedure: BIOPSY;  Surgeon: Jerene Bears, MD;  Location: [place];  Service: Gastroenterology;;   COLONOSCOPY WITH PROPOFOL N/A 06/22/2013   Procedure: COLONOSCOPY WITH PROPOFOL;  Surgeon: Jeryl Columbia, MD;  Location: [facility]COPY;  Service: Endoscopy;  Laterality: N/A;   COLONOSCOPY WITH PROPOFOL N/A 11/18/2018   Procedure: COLONOSCOPY WITH PROPOFOL;  Surgeon: Jerene Bears, MD;  Location: [place];  Service: Gastroenterology;  Laterality: N/A;   ENTEROSCOPY N/A 12/31/2018   Procedure: ENTEROSCOPY;  Surgeon: Irene Shipper, MD;  [facility];  Service: Endoscopy;  Laterality: N/A;  Push enteroscopy at 8am (spoke with Endo)   ESOPHAGOGASTRODUODENOSCOPY N/A 05/11/2015   Procedure: ESOPHAGOGASTRODUODENOSCOPY (EGD);  Surgeon: Laurence Spates, MD;  Location: [facility] ENDOSCOPY;  Service: Endoscopy;  Laterality: N/A;   ESOPHAGOGASTRODUODENOSCOPY (EGD) WITH PROPOFOL N/A 11/18/2018   Procedure: ESOPHAGOGASTRODUODENOSCOPY (EGD) WITH PROPOFOL;  Surgeon: Jerene Bears, MD;  Location: [place] ENDOSCOPY;  Service: Gastroenterology;  Laterality: N/A;   EYE SURGERY Bilateral 2010   Lasik   FLEXIBLE SIGMOIDOSCOPY N/A 01/17/2017   Procedure: FLEXIBLE SIGMOIDOSCOPY w/ hemorrhoid banding possible;  Surgeon: Gatha Mayer, MD;  Location: [facility] ENDOSCOPY;  Service: Endoscopy;  Laterality: N/A;   FLEXIBLE SIGMOIDOSCOPY N/A 01/21/2019   Procedure: FLEXIBLE SIGMOIDOSCOPY;  Surgeon: Ronald Lobo, MD;  Location: [facility] ENDOSCOPY;  Service: Endoscopy;  Laterality: N/A;  Plan to do the test unprepped, unsedated   FOOT AMPUTATION Left    due diabetic neuropathy, could not feel ulcer on bottom of foot   GIVENS CAPSULE STUDY N/A 01/21/2019   Procedure: GIVENS CAPSULE STUDY;  Surgeon: Ronald Lobo, MD;  Location: [place];  Service: Endoscopy;  Laterality: N/A;   POLYPECTOMY  11/18/2018   Procedure: POLYPECTOMY;   Surgeon: Jerene Bears, MD;  Location[facility] ENDOSCOPY;  Service: Gastroenterology;;   REFRACTIVE SURGERY Bilateral 2013   TONSILLECTOMY  1970's   TUBAL LIGATION  2000    Prior to Admission medications   Medication Sig Start Date End Date Taking? Authorizing Provider  acetaminophen (TYLENOL) 325 MG tablet Take 2 tablets (650 mg total) by mouth every 6 (six) hours as needed for fever, headache or moderate pain. 02/26/19  Yes Emokpae, Courage, MD  albuterol (PROVENTIL) (2.5 MG/3ML) 0.083% nebulizer solution Take 5 mg by nebulization daily as needed for wheezing or shortness of breath.   Yes [provider]  albuterol (VENTOLIN HFA) 108 (90 Base) MCG/ACT inhaler Inhale 2 puffs into the lungs every 4 (four) hours as needed for wheezing or shortness of breath. 01/26/19  Yes Dahal, Marlowe Aschoff, MD  ALPRAZolam (XANAX) 0.5 MG tablet Take 0.5 mg by mouth 3 (three) times daily as needed for anxiety. 02/16/19  Yes [provider]  amLODipine (NORVASC) 2.5 MG tablet Take 2.5 mg by mouth daily. 03/16/19  Yes [provider]  atorvastatin (LIPITOR) 40 MG tablet Take 1 tablet (40 mg total) by mouth at bedtime. 02/26/19  Yes Roxan Hockey, MD  Darbepoetin Alfa (ARANESP) 200 MCG/0.4ML SOSY injection Inject 0.4 mLs (200 mcg total) into the vein every Wednesday with hemodialysis. 01/27/19  Yes Dahal, Marlowe Aschoff, MD  diphenhydrAMINE (BENADRYL) 25 mg capsule Take 1 capsule (25 mg total) by mouth every 6 (six) hours as needed for up to 8 days for itching. 01/26/19 02/25/28 Yes Dahal, Marlowe Aschoff, MD  furosemide (LASIX) 80 MG tablet Take 80 mg by mouth 2 (two) times daily.   Yes [provider]  hydrocortisone (ANUSOL-HC) 2.5 % rectal cream Place 1 application rectally 2 (two) times daily. 04/03/19  Yes Danford, Suann Larry, MD  hydrOXYzine (ATARAX/VISTARIL) 25 MG tablet Take 1 tablet (25 mg total) by mouth 3 (three) times daily as needed for itching or anxiety. 02/26/19  Yes Emokpae, Courage, MD    insulin aspart (NOVOLOG) 100 UNIT/ML injection 0-9 Units, Subcutaneous, 3 times daily with meals CBG < 70: implement hypoglycemia protocol CBG 70 - 120: 0 units CBG 121 - 150: 1 unit CBG 151 - 200: 2 units CBG 201 - 250: 3 units CBG 251 - 300: 5 units CBG 301 - 350: 7 units CBG 351 - 400: 9 units CBG > 400: call MD Patient taking differently: Inject 0-9 Units into the skin 3 (three) times daily with meals. 0-9 Units, Subcutaneous, 3 times daily with meals CBG < 70: implement hypoglycemia protocol CBG 70 - 120: 0 units CBG 121 - 150: 1 unit CBG 151 - 200: 2 units CBG 201 - 250: 3 units CBG 251 -  300: 5 units CBG 301 - 350: 7 units CBG 351 - 400: 9 units CBG > 400: call MD 01/26/19  Yes Dahal, Marlowe Aschoff, MD  insulin glargine (LANTUS) 100 UNIT/ML injection Inject 24 Units into the skin at bedtime.   Yes [provider]  lidocaine-prilocaine (EMLA) cream Apply 1 application topically See admin instructions. On dialysis days 12/23/18  Yes [provider]  methocarbamol (ROBAXIN) 500 MG tablet Take 500 mg by mouth daily as needed for muscle spasms.   Yes [provider]  mupirocin ointment (BACTROBAN) 2 % Apply 1 application topically as needed (on arm). 04/03/19  Yes Danford, Suann Larry, MD  Oxycodone HCl 10 MG TABS Take 1 tablet (10 mg total) by mouth 4 (four) times daily. 04/03/19  Yes Danford, Suann Larry, MD  oxymetazoline (AFRIN) 0.05 % nasal spray Place 2 sprays into both nostrils 2 (two) times daily. 01/26/19  Yes Dahal, Marlowe Aschoff, MD  pantoprazole (PROTONIX) 40 MG tablet Take 1 tablet (40 mg total) by mouth daily at 6 (six) AM for 30 days. 01/26/19 05/13/19 Yes Dahal, Marlowe Aschoff, MD  Pramoxine-Menthol-Petrolatum (SARNA ULTRA EX) Apply 1 application topically daily as needed (itch).   Yes [provider]  senna-docusate (SENOKOT-S) 8.6-50 MG tablet Take 2 tablets by mouth at bedtime. 02/26/19 02/26/20 Yes Emokpae, Courage, MD  traZODone (DESYREL) 50 MG tablet Take 0.5 tablets (25  mg total) by mouth at bedtime. 02/26/19 05/13/19 Yes Emokpae, Courage, MD  allopurinol (ZYLOPRIM) 100 MG tablet Take 2 tablets (200 mg total) by mouth daily. 02/26/19 04/01/19  Roxan Hockey, MD    Scheduled Meds:  Chlorhexidine Gluconate Cloth  6 each Topical Q0600   Infusions:  sodium chloride 10 mL/hr at 05/13/19 2352   sodium chloride     sodium chloride     dextrose 5 % and 0.45% NaCl 50 mL/hr at 05/14/19 0008   insulin 0.4 Units/hr (05/14/19 0541)   pantoprozole (PROTONIX) infusion 8 mg/hr (05/14/19 0116)   PRN Meds: sodium chloride, sodium chloride, acetaminophen, alteplase, heparin, lidocaine (PF), lidocaine-prilocaine, pentafluoroprop-tetrafluoroeth   Allergies as of 05/13/2019 - Review Complete 05/13/2019  Allergen Reaction Noted   Penicillins Hives and Other (See Comments) 09/30/2014   Gabapentin Itching and Swelling 02/09/2013   Lyrica [pregabalin] Swelling and Other (See Comments) 09/30/2014   Saphris [asenapine] Swelling and Other (See Comments) 09/30/2014   Tramadol Swelling and Other (See Comments) 09/30/2014   Adhesive [tape] Itching 09/09/2017   Latex Itching 09/09/2017   Other Other (See Comments) 05/01/2015    Family History  Problem Relation Age of Onset   Hypertension Mother    Diabetes Mother    Hypertension Father    Diabetes Father     Social History   Socioeconomic History   Marital status: Single    Spouse name: Not on file   Number of children: 3   Years of education: 12   Highest education level: 12th grade  Occupational History   Occupation: disabled  Scientist, product/process development strain: Not on file   Food insecurity    Worry: Not on file    Inability: Not on Lexicographer needs    Medical: Not on file    Non-medical: Not on file  Tobacco Use   Smoking status: Current Some Day Smoker    Packs/day: 1.00    Years: 36.00    Pack years: 36.00    Types: Cigarettes   Smokeless tobacco:  Never Used   Tobacco comment: 5 cigs in one  setting  Substance and Sexual Activity   Alcohol use: Yes    Alcohol/week: 3.0 standard drinks    Types: 3 Standard drinks or equivalent per week    Frequency: Never    Comment: last use about 2 weeks ago   Drug use: Not Currently    Types: "Crack" cocaine, Cocaine    Comment: last use >2 years ago   Sexual activity: Not Currently    Birth control/protection: Abstinence  Lifestyle   Physical activity    Days per week: Not on file    Minutes per session: Not on file   Stress: Not on file  Relationships   Social connections    Talks on phone: Not on file    Gets together: Not on file    Attends religious service: Not on file    Active member of club or organization: Not on file    Attends meetings of clubs or organizations: Not on file    Relationship status: Not on file   Intimate partner violence    Fear of current or ex partner: Not on file    Emotionally abused: Not on file    Physically abused: Not on file    Forced sexual activity: Not on file  Other Topics Concern   Not on file  Social History Narrative   ** Merged History Encounter **   Single, lives with son in a one story home. Unable to exercise. Avoids caffeine. 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        REVIEW OF SYSTEMS: Constitutional: Weakness, fatigue. ENT:  No nose bleeds Pulm: See HPI. CV:  No palpitations, no LE edema.  GU:  No hematuria, no frequency GI: See HPI. Heme: See HPI. Transfusions: Generally declines transfusion but has consented to transfusions on occasion, most recently 1 unit in 10/2018, 1 unit 11/2018, 1 unit 03/2019 Neuro:  No headaches, no peripheral tingling or numbness Derm:  No itching, no rash or sores.  Endocrine:  No sweats or chills.  No polyuria or dysuria Immunization: Reviewed. Travel:  None beyond local counties in last few months.    PHYSICAL EXAM: Vital signs in last 24  hours: Vitals:   05/14/19 0530 05/14/19 0615  BP: 138/64 (!) 143/64  Pulse:    Resp: 15   Temp:    SpO2:     Wt Readings from Last 3 Encounters:  04/03/19 73.3 kg  03/12/19 69.2 kg  02/26/19 72.5 kg    General: Chronically ill, calm, comfortable Head: No facial asymmetry or swelling.  No signs of head trauma. Eyes: No conjunctival pallor.  No scleral icterus. Ears: Not hard of hearing. Nose: No discharge or congestion Mouth: Moist, pink, clear oral mucosa.  Tongue midline Neck: No JVD, no masses, no thyromegaly. Lungs: Clear bilaterally.  No labored breathing, no cough. Heart: RRR.  No MRG.  S1, S2 present. Abdomen: Soft.  Not tender.  Slightly distended.  No HSM, masses, bruits, hernias.  Normal quality and activity to bowel sounds..   Rectal: Not repeat DRE.  She is FOBT positive as she has consistently been throughout full assays this year and in previous years dating back to 2015 Musc/Skeltl: No joint redness or swelling.  Fingers are short with clubbing appearance. Extremities: No CCE.  Dialysis access graft on right arm Neurologic: Appropriate.  Oriented x3.  No involuntary movement.  Moves all 4 limbs, strength not tested. Skin: Multiple raised lesions on back, arms, scalp.  Some  of these are raw from itching. Nodes: No cervical adenopathy Psych: Cooperative, pleasant, calm.  Intake/Output from previous day: No intake/output data recorded. Intake/Output this shift: No intake/output data recorded.  LAB RESULTS: Recent Labs    05/13/19 1812 05/14/19 0340  WBC 3.7* 3.9*  HGB 5.7* 4.7*  HCT 18.0* 15.2*  PLT 114* 102*   BMET Lab Results  Component Value Date   NA 132 (L) 05/14/2019   NA 128 (L) 05/13/2019   NA 132 (L) 04/03/2019   K 4.8 05/14/2019   K 5.0 05/13/2019   K 5.7 (H) 04/03/2019   CL 89 (L) 05/14/2019   CL 83 (L) 05/13/2019   CL 93 (L) 04/03/2019   CO2 16 (L) 05/14/2019   CO2 19 (L) 05/13/2019   CO2 27 04/03/2019   GLUCOSE 158 (H) 05/14/2019     GLUCOSE 380 (H) 05/13/2019   GLUCOSE 182 (H) 04/03/2019   BUN 158 (H) 05/14/2019   BUN 155 (H) 05/13/2019   BUN 57 (H) 04/03/2019   CREATININE 13.53 (H) 05/14/2019   CREATININE 13.30 (H) 05/13/2019   CREATININE 4.92 (H) 04/03/2019   CALCIUM 7.8 (L) 05/14/2019   CALCIUM 8.2 (L) 05/13/2019   CALCIUM 8.4 (L) 04/03/2019   LFT Recent Labs    05/13/19 1812 05/14/19 0340  PROT 6.9  --   ALBUMIN 3.4* 2.9*  AST 43*  --   ALT 63*  --   ALKPHOS 93  --   BILITOT 1.2  --    PT/INR Lab Results  Component Value Date   INR 1.5 (H) 03/10/2019   INR 1.3 (H) 12/29/2018   INR 1.2 11/17/2018   PROTIME 17.3 (A) 12/23/2015   PROTIME 15.9 (A) 12/23/2015   Hepatitis Panel No results for input(s): HEPBSAG, HCVAB, HEPAIGM, HEPBIGM in the last 72 hours. C-Diff No components found for: CDIFF Lipase     Component Value Date/Time   LIPASE 66 (H) 05/13/2019 1812    Drugs of Abuse     Component Value Date/Time   LABOPIA NONE DETECTED 01/01/2017 0024   COCAINSCRNUR POSITIVE (A) 01/01/2017 0024   LABBENZ NONE DETECTED 01/01/2017 0024   AMPHETMU NONE DETECTED 01/01/2017 0024   THCU NONE DETECTED 01/01/2017 0024   LABBARB NONE DETECTED 01/01/2017 0024     RADIOLOGY STUDIES: Dg Abd Acute W/chest  Result Date: 05/13/2019 CLINICAL DATA:  Chest pain, shortness of breath, nausea, and vomiting. EXAM: DG ABDOMEN ACUTE W/ 1V CHEST COMPARISON:  CT abdomen pelvis dated April 02, 2019. Chest x-ray dated April 01, 2019. FINDINGS: Unchanged moderate cardiomegaly. Normal pulmonary vascularity. No focal consolidation, pleural effusion, or pneumothorax. There is no evidence of dilated bowel loops or free intraperitoneal air. No radiopaque calculi or other significant radiographic abnormality is seen. No acute osseous abnormality. IMPRESSION: 1. No acute findings.  No active cardiopulmonary disease. 2. Unchanged moderate cardiomegaly. Electronically Signed   By: Titus Dubin M.D.   On: 05/13/2019 18:30      IMPRESSION:   *   Nausea, vomiting, diarrhea.  *    End-stage renal disease, noncompliance with hemodialysis and had missed dialysis for more than 1 week  *    Acute on chronic severe anemia in a Jehovah's Witness.  Declines blood transfusion.  Has orders for weekly Aranesp.   *    Dyspnea, chest pain.  Minor elevation of high-sensitivity troponins.  Suspect demand ischemia.  Patient not a candidate for PCI.    PLAN:     *    No plans  for upper endoscopy or colonoscopy or further GI imaging.  Her symptoms are better and will plan to start carb modified/renal diet. Would limit blood draws to avoid phlebotomy related further decline of blood counts given that she does not want to undergo transfusion.     *    Discontinue the Protonix drip and resume Protonix 40 mg/day.  No plans to follow pt in GI office   Azucena Freed  05/14/2019, 8:21 AM Phone [phone]   Attending physician's note   I have taken a history, examined the patient and reviewed the chart. I agree with the Advanced Practitioner's note, impression and recommendations.  7 yr F with ESRD on HD, non compliant, Jehovah's witness with worsening chronic anemia  Hgb 4 Patient declined blood transfusion Anemia is multifactorial, given the degree of anemia is at risk for procedure and anesthesia related complication. No plan for repeat diagnostic EGD/colonoscopy.  Management of anemia per nephrology team Palliative care consult to discuss goals of care  We will sign off, available if have any questions    K. Denzil Magnuson , MD [phone]

## 2019-05-14 NOTE — Progress Notes (Signed)
Patient agreed to transfusion and these have been ordered.  It is reported she had a dark stool. Placed orders for upper endoscopy tomorrow with Dr. Lyndel Safe.  Azucena Freed PA-C [phone]

## 2019-05-14 NOTE — ED Notes (Signed)
Checked CBG 104

## 2019-05-14 NOTE — ED Notes (Signed)
Walked by the room and patient was standing at the end of the bed. Patient had taken everything off including the tele monitor. I asked patient to return to the bed. Patient agreed to get back in bed but stated she was not going to wear the tele monitor. Educated patient on the importance of wearing monitor but patient still refuses.

## 2019-05-14 NOTE — Progress Notes (Signed)
CRITICAL VALUE ALERT  Critical Value:  Lactic acid 2.5  Date & Time Notied:  05/14/2019 1352  Provider Notified: Dr. Wyline Copas  Orders Received/Actions taken: See orders

## 2019-05-14 NOTE — Progress Notes (Signed)
PROGRESS NOTE    CEAIRA [name]  BDZ:[id number] DOB: [birth year] DOA: 05/13/2019 PCP: Rocco Serene, MD    Brief Narrative:  53 y.o. female with history of ESRD on hemodialysis, hypertension, diabetes mellitus, recurrent GI bleed admitted in August 2020 with GI bleed presents to the ER because of shortness of breath and had missed her dialysis for last 1 week.  Patient states that over the last 5 days patient also has been nausea vomiting and diarrhea.  And left flank pain.  Denies any fever chills productive cough has had some chest discomfort on throwing up.  ED Course: In the ER patient was noticed to have hemoglobin of 5.7 which is a drop of 3 g from August at the time it was 8.3.  Patient is 114.  This is unremarkable.  Metabolic panel shows blood glucose of 380 bicarb 19 anion gap 26.  Stool for occult blood was positive and was melanotic.  COVID-19 test is negative.  On-call nephrologist was consulted.  Given the GI bleed on-call gastroenterology Dr. Havery Moros was also notified advised to start patient on Protonix infusion.  Patient is Jehovah's Witness and at this time is refused blood transfusion but during previous admission has agreed for transfusion.  Given the DKA picture patient was started on insulin infusion without any IV fluid bolus.  On exam patient has mild tenderness of the left lower quadrant for which CT abdomen and lactic acid has been ordered.  Assessment & Plan:   Principal Problem:   Acute GI bleeding Active Problems:   Seizure disorder (HCC)   ESRD (end stage renal disease) (HCC)   Prolonged QT interval   Acute blood loss anemia   DKA (diabetic ketoacidoses) ([place])  1. Acute GI bleeding given the melanotic stool patient is suspected to have upper GI bleed.   1. Patient has had extensive GI study this year with EGD colonoscopy and enteroscopy capsule study.   2. Pt was continued on protonix gtt, changed to PO by GI 3. Pt had been stable this AM. As pt was not  considered a candidate for repeat endoscopy, GI had signed off 4. On arrival to floor, pt noted to have large melanotic stools. Pt is Jehova's witness and thus had been refusing blood transfusion 5. Over phone, pt can be heard in presence of nursing staff "Just give it to me," in reference to blood transfusion 6. Two units of PRBC's ordered, with RN to consent to blood transfusion prior to initiating 7. Will follow serial H/H and try to minimize blood draw if possible 8. Will change PO protonix to IV BID. 9. Discussed with GI, who will re-assess 2. Acute respiratory failure with hypoxia secondary to patient missing dialysis. 1. Nephrology was consulted and patient has since undergone HD this AM 2. Cont plan per Nephrology 3. Diabetic ketoacidosis 1. Pt is continued on IV insulin infusion 2. Remains with elevated GAP in the 20's, acidotic 4. Hypertension 1. Pt is continued on PRN IV hydralazine. 2. BP thus far stable 5. Prolonged QT interval  1. Cont to avoid QT prolonging medications. 6. Acute blood loss anemia 1. Cont per above 2. Over phone, pt has since consented to receiving blood transfusion 3. Have ordered 2 units PRBC's. RN to complete official consent form with pt prior to initiating 4. Follow CBC trends 7. Possible scabies 1. Pt reports possible scabies exposure with diffuse pruritic rash across body, most notably across torso, shoulders, scalp. Also itchy between fingers, however no discernable rash  2. Pt is continued on empiric permethrin cream  DVT prophylaxis: SCD's Code Status: Full Family Communication: Pt in room, family not in room Disposition Plan: Uncertain at this time  Consultants:   Nephrology  GI  Procedures:     Antimicrobials: Anti-infectives (From admission, onward)   None       Subjective: Complaining of itching  Objective: Vitals:   05/14/19 1100 05/14/19 1139 05/14/19 1145 05/14/19 1339  BP: (!) 153/66 (!) 118/48 (!) 122/58 (!)  143/63  Pulse: 84 80 82 84  Resp: 16 18 17 16   Temp:  98 F (36.7 C)  97.6 F (36.4 C)  TempSrc:    Oral  SpO2:  99%  100%    Intake/Output Summary (Last 24 hours) at 05/14/2019 1401 Last data filed at 05/14/2019 1139 Gross per 24 hour  Intake -  Output 865 ml  Net -865 ml   There were no vitals filed for this visit.  Examination:  General exam: Appears calm and comfortable  Respiratory system: Clear to auscultation. Respiratory effort normal. Cardiovascular system: S1 & S2 heard, RRR Gastrointestinal system: Abdomen is nondistended, soft and nontender. No organomegaly or masses felt. Normal bowel sounds heard. Central nervous system: Alert and oriented. No focal neurological deficits. Extremities: Symmetric 5 x 5 power. Skin: diffuse pruritic rash over scalp, shoulders, torso. Itching between fingers Psychiatry: Judgement and insight appear normal. Mood & affect appropriate.   Data Reviewed: I have personally reviewed following labs and imaging studies  CBC: Recent Labs  Lab 05/13/19 1812 05/14/19 0340  WBC 3.7* 3.9*  NEUTROABS 2.9  --   HGB 5.7* 4.7*  HCT 18.0* 15.2*  MCV 101.1* 103.4*  PLT 114* 546*   Basic Metabolic Panel: Recent Labs  Lab 05/13/19 1812 05/14/19 0340  NA 128* 132*  K 5.0 4.8  CL 83* 89*  CO2 19* 16*  GLUCOSE 380* 158*  BUN 155* 158*  CREATININE 13.30* 13.53*  CALCIUM 8.2* 7.8*  PHOS  --  10.2*   GFR: CrCl cannot be calculated (Unknown ideal weight.). Liver Function Tests: Recent Labs  Lab 05/13/19 1812 05/14/19 0340  AST 43*  --   ALT 63*  --   ALKPHOS 93  --   BILITOT 1.2  --   PROT 6.9  --   ALBUMIN 3.4* 2.9*   Recent Labs  Lab 05/13/19 1812  LIPASE 66*   No results for input(s): AMMONIA in the last 168 hours. Coagulation Profile: No results for input(s): INR, PROTIME in the last 168 hours. Cardiac Enzymes: No results for input(s): CKTOTAL, CKMB, CKMBINDEX, TROPONINI in the last 168 hours. BNP (last 3 results) No  results for input(s): PROBNP in the last 8760 hours. HbA1C: No results for input(s): HGBA1C in the last 72 hours. CBG: Recent Labs  Lab 05/14/19 0634 05/14/19 0754 05/14/19 0933 05/14/19 1136 05/14/19 1255  GLUCAP 104* 124* 135* 160* 191*   Lipid Profile: No results for input(s): CHOL, HDL, LDLCALC, TRIG, CHOLHDL, LDLDIRECT in the last 72 hours. Thyroid Function Tests: No results for input(s): TSH, T4TOTAL, FREET4, T3FREE, THYROIDAB in the last 72 hours. Anemia Panel: No results for input(s): VITAMINB12, FOLATE, FERRITIN, TIBC, IRON, RETICCTPCT in the last 72 hours. Sepsis Labs: Recent Labs  Lab 05/14/19 1305  LATICACIDVEN 2.5*    Recent Results (from the past 240 hour(s))  SARS Coronavirus 2 by RT PCR (hospital order, performed in [facility] hospital lab) Nasopharyngeal Nasopharyngeal Swab     Status: None   Collection Time: 05/13/19 10:14  PM   Specimen: Nasopharyngeal Swab  Result Value Ref Range Status   SARS Coronavirus 2 NEGATIVE NEGATIVE Final    Comment: (NOTE) If result is NEGATIVE SARS-CoV-2 target nucleic acids are NOT DETECTED. The SARS-CoV-2 RNA is generally detectable in upper and lower  respiratory specimens during the acute phase of infection. The lowest  concentration of SARS-CoV-2 viral copies this assay can detect is 250  copies / mL. A negative result does not preclude SARS-CoV-2 infection  and should not be used as the sole basis for treatment or other  patient management decisions.  A negative result may occur with  improper specimen collection / handling, submission of specimen other  than nasopharyngeal swab, presence of viral mutation(s) within the  areas targeted by this assay, and inadequate number of viral copies  (<250 copies / mL). A negative result must be combined with clinical  observations, patient history, and epidemiological information. If result is POSITIVE SARS-CoV-2 target nucleic acids are DETECTED. The SARS-CoV-2 RNA is  generally detectable in upper and lower  respiratory specimens dur ing the acute phase of infection.  Positive  results are indicative of active infection with SARS-CoV-2.  Clinical  correlation with patient history and other diagnostic information is  necessary to determine patient infection status.  Positive results do  not rule out bacterial infection or co-infection with other viruses. If result is PRESUMPTIVE POSTIVE SARS-CoV-2 nucleic acids MAY BE PRESENT.   A presumptive positive result was obtained on the submitted specimen  and confirmed on repeat testing.  While 2019 novel coronavirus  (SARS-CoV-2) nucleic acids may be present in the submitted sample  additional confirmatory testing may be necessary for epidemiological  and / or clinical management purposes  to differentiate between  SARS-CoV-2 and other Sarbecovirus currently known to infect humans.  If clinically indicated additional testing with an alternate test  methodology [phone]) is advised. The SARS-CoV-2 RNA is generally  detectable in upper and lower respiratory sp ecimens during the acute  phase of infection. The expected result is Negative. Fact Sheet for Patients:  StrictlyIdeas.no Fact Sheet for Healthcare Providers: BankingDealers.co.za This test is not yet approved or cleared by the [place] FDA and has been authorized for detection and/or diagnosis of SARS-CoV-2 by FDA under an Emergency Use Authorization (EUA).  This EUA will remain in effect (meaning this test can be used) for the duration of the COVID-19 declaration under Section 564(b)(1) of the Act, 21 U.S.C. section 360bbb-3(b)(1), unless the authorization is terminated or revoked sooner. Performed at [facility], [place] [address], [place], [place] [address]      Radiology Studies: Dg Abd Acute W/chest  Result Date: 05/13/2019 CLINICAL DATA:  Chest pain, shortness of breath, nausea, and  vomiting. EXAM: DG ABDOMEN ACUTE W/ 1V CHEST COMPARISON:  CT abdomen pelvis dated April 02, 2019. Chest x-ray dated April 01, 2019. FINDINGS: Unchanged moderate cardiomegaly. Normal pulmonary vascularity. No focal consolidation, pleural effusion, or pneumothorax. There is no evidence of dilated bowel loops or free intraperitoneal air. No radiopaque calculi or other significant radiographic abnormality is seen. No acute osseous abnormality. IMPRESSION: 1. No acute findings.  No active cardiopulmonary disease. 2. Unchanged moderate cardiomegaly. Electronically Signed   By: Titus Dubin M.D.   On: 05/13/2019 18:30   Ct Renal Stone Study  Result Date: 05/14/2019 CLINICAL DATA:  Left lower abdominal/flank pain, nausea, vomiting, diarrhea. End-stage renal disease on hemodialysis. Anemia. EXAM: CT ABDOMEN AND PELVIS WITHOUT CONTRAST TECHNIQUE: Multidetector CT imaging of the  abdomen and pelvis was performed following the standard protocol without IV contrast. COMPARISON:  01/22/2019 CT abdomen/pelvis. FINDINGS: Lower chest: Small dependent left pleural effusion. Cardiomegaly. Low cardiac blood pool density. Coronary atherosclerosis. Hepatobiliary: Diffusely finely irregular liver surface compatible with hepatic cirrhosis. No liver masses. Cholelithiasis. Contracted gallbladder. No gallbladder wall thickening. No biliary ductal dilatation. Pancreas: Diffuse mild peripancreatic fat stranding. No pancreatic duct dilation or discrete mass. Spleen: Mild splenomegaly (craniocaudal splenic length 13.0 cm), slightly increased. No splenic mass. Adrenals/Urinary Tract: Normal adrenals. A 2 mm calcification in the interpolar right kidney could represent vascular calcification or nonobstructing stone. No hydronephrosis. No contour deforming renal masses. Collapsed and normal bladder. Stomach/Bowel: Normal non-distended stomach. Normal caliber small bowel with no small bowel wall thickening. Normal appendix. Oral contrast  transits to the distal large bowel. No definite large bowel wall thickening or significant diverticulosis. Vascular/Lymphatic: Atherosclerotic nonaneurysmal abdominal aorta. Mild bilateral external iliac lymphadenopathy measuring up to 1.1 cm on the left (series 3/image 75), new since 01/22/2027. No pathologically enlarged abdominal nodes. Reproductive: Status post hysterectomy, with no abnormal findings at the vaginal cuff. No adnexal mass. Other: No pneumoperitoneum. Trace pelvic ascites. No focal fluid collection. Mild anasarca. Musculoskeletal: No aggressive appearing focal osseous lesions. Faint sclerosis throughout the skeleton compatible with renal osteodystrophy. IMPRESSION: 1. Diffuse mild peripancreatic fat stranding, cannot exclude acute pancreatitis. 2. Cholelithiasis. No evidence of acute cholecystitis. No biliary or pancreatic duct dilation. 3. Cirrhosis.  Mild splenomegaly. 4. Small dependent left pleural effusion. Trace pelvic ascites. Mild anasarca. 5. No evidence of bowel obstruction or acute bowel inflammation. 6. Nonspecific mild bilateral external iliac lymphadenopathy, new since 01/22/2019 CT. Suggest attention on follow-up CT abdomen/pelvis with oral and IV contrast in 3 months. This recommendation follows ACR consensus guidelines: White Paper of the ACR Incidental Findings Committee II on Splenic and Nodal Findings. J Am Coll Radiol 8250;53:976-734. 7. Low cardiac blood pool density, compatible with anemia. 8.  Aortic Atherosclerosis (ICD10-I70.0). Electronically Signed   By: Ilona Sorrel M.D.   On: 05/14/2019 08:38    Scheduled Meds: . sodium chloride   Intravenous Once  . acetaminophen      . Chlorhexidine Gluconate Cloth  6 each Topical Q0600  . pantoprazole  40 mg Oral Q0600   Continuous Infusions: . sodium chloride 10 mL/hr at 05/13/19 2352  . sodium chloride    . sodium chloride    . dextrose 5 % and 0.45% NaCl 50 mL/hr at 05/14/19 1249  . insulin 0.5 Units/hr (05/14/19  1312)     LOS: 1 day   Marylu Lund, MD [facility] Pager On Amion  If 7PM-7AM, please contact night-coverage 05/14/2019, 2:01 PM

## 2019-05-14 NOTE — Procedures (Signed)
Patient seen and examined on Hemodialysis. BP (!) 163/75   Pulse 84   Temp 98.4 F (36.9 C) (Oral)   Resp 16   LMP  (LMP Unknown)   SpO2 99%   QB 400 mL/ min via AVF UF goal 3L.  Hgb is 4.7.  Discussed transfusion with pt.  She states "I have taken those before and I'm no better.  I have gone against my God.  I am not going to do that again.  I would rather die".  Discussed risks of dialyzing with pt's Hgb that low.  She is on supplemental O2 now.  Recommend pediatric tubes and minimizing blood draws to only when necessary.    Tolerating treatment without complaints at this time.   Madelon Lips MD [facility] pgr [phone] 9:24 AM

## 2019-05-14 NOTE — ED Notes (Signed)
D50 ordered per glucose stabilzer. MD notified of CBG and hmg. PT still refusing blood transfusion.

## 2019-05-14 NOTE — H&P (View-Only) (Signed)
Patient agreed to transfusion and these have been ordered.  It is reported she had a dark stool. Placed orders for upper endoscopy tomorrow with Dr. Lyndel Safe.  Azucena Freed PA-C [phone]

## 2019-05-14 NOTE — Consult Note (Signed)
Reason for Consult: ESRD Referring Physician:  Dr. Sherry Ruffing  Chief Complaint: Shortness of breath and chest pain  HD orders at GO MWF 3hr 30 min 160NRe thru  Lt FAL AVG  EDW 74kg 2/2 bath UFR Profile 4 Mircera 225 q2weeks last given 9/30  Calcitriol 2.5 PO TIW   Assessment/Plan: 1. ESRD - last HD was 9/30 and only on for 2hr 22min leaving 1.2 L above EDW of 74kg. She has gotten close to her EDW previously. 2. Renal osteodystrophy - will check a phos for binder management 3. 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 Will try to dialyze and hopefully she tolerates as she would benefit from a transfusion. GI to see pt as well. Spoke to her at length and she would rather pass than receive a transfusion. 4. CP - not a candidate for PCI. 5. CHF - actually fairly well compensated considering.     HPI: [name] is an 53 y.o. female HTN, anemia, anxiety, schizoaffective disorder, hepatitis C,  hypercholesterolemia, noncompliance, CHF, substance abuse, & prior GI bleeds,  ESRD on dialysis MWF p/w N/V/D, fatigue, dyspnea, & chest discomfort. Patient states she has had N/V/D over the past 5 days- a few episodes of both vomiting and diarrhea which is worse with PO intake, no alleviating factors as well as fatigue and shortness of breath with central chest discomfort. She states that the diarrhea was profuse and often which precluded making it to her dialysis treatments. No alleviating/aggravating factors. Last dialyzed 05/05/19 and has missed her last few dialysis sessions as she states she did not feel well enough to go. She also has not had her insulin either but denies fever, chills, abdominal pain, melena, hematochezia, hematemesis, dizziness, syncope, or covid exposures.   ROS Pertinent items are noted in HPI.  Chemistry and CBC: Creatinine, Ser  Date/Time Value Ref Range Status  05/14/2019 03:40 AM 13.53 (H) 0.44 - 1.00 mg/dL Final  05/13/2019 06:12 PM 13.30 (H) 0.44 - 1.00  mg/dL Final  04/03/2019 03:41 AM 4.92 (H) 0.44 - 1.00 mg/dL Final  04/02/2019 03:35 AM 3.47 (H) 0.44 - 1.00 mg/dL Final    Comment:    DELTA CHECK NOTED  04/01/2019 11:45 AM 7.74 (H) 0.44 - 1.00 mg/dL Final  03/12/2019 07:14 AM 8.42 (H) 0.44 - 1.00 mg/dL Final  03/11/2019 07:45 AM 7.16 (H) 0.44 - 1.00 mg/dL Final    Comment:    DELTA CHECK NOTED DIALYSIS   03/10/2019 12:54 PM 10.87 (H) 0.44 - 1.00 mg/dL Final  02/26/2019 08:50 AM 6.42 (H) 0.44 - 1.00 mg/dL Final  02/25/2019 02:57 PM 4.93 (H) 0.44 - 1.00 mg/dL Final  02/25/2019 07:41 AM 4.09 (H) 0.44 - 1.00 mg/dL Final  02/25/2019 06:53 AM 3.53 (H) 0.44 - 1.00 mg/dL Final  02/25/2019 04:47 AM 4.40 (H) 0.44 - 1.00 mg/dL Final  02/24/2019 10:38 PM 9.54 (H) 0.44 - 1.00 mg/dL Final  01/26/2019 04:47 AM 7.11 (H) 0.44 - 1.00 mg/dL Final  01/25/2019 04:57 AM 5.80 (H) 0.44 - 1.00 mg/dL Final    Comment:    DELTA CHECK NOTED  01/24/2019 04:57 AM 3.84 (H) 0.44 - 1.00 mg/dL Final    Comment:    DELTA CHECK NOTED  01/23/2019 10:10 AM 7.75 (H) 0.44 - 1.00 mg/dL Final  01/22/2019 03:42 AM 5.97 (H) 0.44 - 1.00 mg/dL Final  01/21/2019 04:13 AM 4.42 (H) 0.44 - 1.00 mg/dL Final    Comment:    DELTA CHECK NOTED  01/20/2019 07:48  AM 9.30 (H) 0.44 - 1.00 mg/dL Final  01/20/2019 07:41 AM 8.54 (H) 0.44 - 1.00 mg/dL Final  12/31/2018 05:06 AM 7.04 (H) 0.44 - 1.00 mg/dL Final  12/30/2018 05:40 AM 5.24 (H) 0.44 - 1.00 mg/dL Final    Comment:    DELTA CHECK NOTED DIALYSIS   12/29/2018 08:23 AM 9.60 (H) 0.44 - 1.00 mg/dL Final  11/20/2018 02:46 AM 4.11 (H) 0.44 - 1.00 mg/dL Final    Comment:    DELTA CHECK NOTED  11/19/2018 12:46 PM 2.83 (H) 0.44 - 1.00 mg/dL Final  11/19/2018 03:49 AM 6.39 (H) 0.44 - 1.00 mg/dL Final  11/18/2018 02:13 AM 4.65 (H) 0.44 - 1.00 mg/dL Final    Comment:    DELTA CHECK NOTED  11/17/2018 10:16 AM 9.01 (H) 0.44 - 1.00 mg/dL Final  10/10/2018 07:45 AM 6.81 (H) 0.44 - 1.00 mg/dL Final  10/08/2018 08:07 AM 6.15 (H) 0.44 -  1.00 mg/dL Final  10/07/2018 04:16 AM 4.16 (H) 0.44 - 1.00 mg/dL Final    Comment:    DELTA CHECK NOTED  10/06/2018 03:22 PM 2.54 (H) 0.44 - 1.00 mg/dL Final    Comment:    DIALYSIS  10/06/2018 05:52 AM 6.31 (H) 0.44 - 1.00 mg/dL Final  10/05/2018 03:08 PM 4.44 (H) 0.44 - 1.00 mg/dL Final    Comment:    DIALYSIS  10/05/2018 03:51 AM 9.75 (H) 0.44 - 1.00 mg/dL Final  10/05/2018 01:55 AM 9.77 (H) 0.44 - 1.00 mg/dL Final  04/11/2018 12:08 PM 4.28 (H) 0.44 - 1.00 mg/dL Final  03/19/2018 08:18 AM 4.67 (H) 0.44 - 1.00 mg/dL Final  03/18/2018 01:06 PM 4.69 (H) 0.44 - 1.00 mg/dL Final  03/17/2018 02:49 PM 6.82 (H) 0.44 - 1.00 mg/dL Final  03/17/2018 05:38 AM 6.33 (H) 0.44 - 1.00 mg/dL Final    Comment:    DELTA CHECK NOTED  03/16/2018 09:29 PM 10.00 (H) 0.44 - 1.00 mg/dL Final  03/16/2018 09:22 PM 9.56 (H) 0.44 - 1.00 mg/dL Final  09/09/2017 10:34 PM 4.84 (H) 0.44 - 1.00 mg/dL Final  08/10/2017 04:06 AM 3.27 (H) 0.44 - 1.00 mg/dL Final  08/09/2017 04:46 AM 3.17 (H) 0.44 - 1.00 mg/dL Final  08/08/2017 12:45 AM 3.44 (H) 0.44 - 1.00 mg/dL Final    Comment:    DELTA CHECK NOTED  08/07/2017 09:48 AM 5.45 (H) 0.44 - 1.00 mg/dL Final  07/20/2017 02:48 AM 4.80 (H) 0.44 - 1.00 mg/dL Final  07/19/2017 08:33 AM 7.10 (H) 0.44 - 1.00 mg/dL Final   Recent Labs  Lab 05/13/19 1812 05/14/19 0340  NA 128* 132*  K 5.0 4.8  CL 83* 89*  CO2 19* 16*  GLUCOSE 380* 158*  BUN 155* 158*  CREATININE 13.30* 13.53*  CALCIUM 8.2* 7.8*  PHOS  --  10.2*   Recent Labs  Lab 05/13/19 1812 05/14/19 0340  WBC 3.7* 3.9*  NEUTROABS 2.9  --   HGB 5.7* 4.7*  HCT 18.0* 15.2*  MCV 101.1* 103.4*  PLT 114* 102*   Liver Function Tests: Recent Labs  Lab 05/13/19 1812 05/14/19 0340  AST 43*  --   ALT 63*  --   ALKPHOS 93  --   BILITOT 1.2  --   PROT 6.9  --   ALBUMIN 3.4* 2.9*   Recent Labs  Lab 05/13/19 1812  LIPASE 66*   No results for input(s): AMMONIA in the last 168 hours. Cardiac  Enzymes: No results for input(s): CKTOTAL, CKMB, CKMBINDEX, TROPONINI in the last 168 hours. Iron Studies:  No results for input(s): IRON, TIBC, TRANSFERRIN, FERRITIN in the last 72 hours. PT/INR: @LABRCNTIP (inr:5)  Xrays/Other Studies: ) Results for orders placed or performed during the hospital encounter of 05/13/19 (from the past 48 hour(s))  POC CBG, ED     Status: Abnormal   Collection Time: 05/13/19  4:53 PM  Result Value Ref Range   Glucose-Capillary 380 (H) 70 - 99 mg/dL  CBC with Differential     Status: Abnormal   Collection Time: 05/13/19  6:12 PM  Result Value Ref Range   WBC 3.7 (L) 4.0 - 10.5 K/uL   RBC 1.78 (L) 3.87 - 5.11 MIL/uL   Hemoglobin 5.7 (LL) 12.0 - 15.0 g/dL    Comment: REPEATED TO VERIFY THIS CRITICAL RESULT HAS VERIFIED AND BEEN CALLED TO K FIELDS,RN BY DENNIS BRADLEY ON 10 08 2020 AT 1839, AND HAS BEEN READ BACK.     HCT 18.0 (L) 36.0 - 46.0 %   MCV 101.1 (H) 80.0 - 100.0 fL   MCH 32.0 26.0 - 34.0 pg   MCHC 31.7 30.0 - 36.0 g/dL   RDW 15.2 11.5 - 15.5 %   Platelets 114 (L) 150 - 400 K/uL    Comment: REPEATED TO VERIFY PLATELET COUNT CONFIRMED BY SMEAR Immature Platelet Fraction may be clinically indicated, consider ordering this additional test [id number]    nRBC 0.0 0.0 - 0.2 %   Neutrophils Relative % 77 %   Neutro Abs 2.9 1.7 - 7.7 K/uL   Lymphocytes Relative 15 %   Lymphs Abs 0.5 (L) 0.7 - 4.0 K/uL   Monocytes Relative 6 %   Monocytes Absolute 0.2 0.1 - 1.0 K/uL   Eosinophils Relative 1 %   Eosinophils Absolute 0.0 0.0 - 0.5 K/uL   Basophils Relative 0 %   Basophils Absolute 0.0 0.0 - 0.1 K/uL   Immature Granulocytes 1 %   Abs Immature Granulocytes 0.03 0.00 - 0.07 K/uL    Comment: Performed at [facility], [address], [place], [place] [address]  Comprehensive metabolic panel     Status: Abnormal   Collection Time: 05/13/19  6:12 PM  Result Value Ref Range   Sodium 128 (L) 135 - 145 mmol/L   Potassium 5.0 3.5 - 5.1 mmol/L    Chloride 83 (L) 98 - 111 mmol/L   CO2 19 (L) 22 - 32 mmol/L   Glucose, Bld 380 (H) 70 - 99 mg/dL   BUN 155 (H) 6 - 20 mg/dL   Creatinine, Ser 13.30 (H) 0.44 - 1.00 mg/dL   Calcium 8.2 (L) 8.9 - 10.3 mg/dL   Total Protein 6.9 6.5 - 8.1 g/dL   Albumin 3.4 (L) 3.5 - 5.0 g/dL   AST 43 (H) 15 - 41 U/L   ALT 63 (H) 0 - 44 U/L   Alkaline Phosphatase 93 38 - 126 U/L   Total Bilirubin 1.2 0.3 - 1.2 mg/dL   GFR calc non Af Amer 3 (L) >60 mL/min   GFR calc Af Amer 3 (L) >60 mL/min   Anion gap 26 (H) 5 - 15    Comment: RESULT CHECKED Performed at [facility], [place] [address], [place], [place] [address]   Troponin I (High Sensitivity)     Status: Abnormal   Collection Time: 05/13/19  6:12 PM  Result Value Ref Range   Troponin I (High Sensitivity) 39 (H) <18 ng/L    Comment: (NOTE) Elevated high sensitivity troponin I (hsTnI) values and significant  changes across serial  measurements may suggest ACS but many other  chronic and acute conditions are known to elevate hsTnI results.  Refer to the Links section for chest pain algorithms and additional  guidance. Performed at [facility], [place] [address], [place], [place] [address]   Lipase, blood     Status: Abnormal   Collection Time: 05/13/19  6:12 PM  Result Value Ref Range   Lipase 66 (H) 11 - 51 U/L    Comment: Performed at [facility] [address] [place], [place] [address]  POC occult blood, ED     Status: Abnormal   Collection Time: 05/13/19  7:14 PM  Result Value Ref Range   Fecal Occult Bld POSITIVE (A) NEGATIVE  No blood products     Status: None   Collection Time: 05/13/19  7:17 PM  Result Value Ref Range   Transfuse no blood products      PT REFUSED BLOOD PRODUCTS, CONSULT PATHOLOGIST BEFORE TRANSFUSING JEHOVAH WITNESS, CONSULT PATHOLOGIST BEFORE TRANSFUSING FOR 05/13/2019 ADMISSIONS Drain BLOOD BANK REC'D FAXED BLOOD REFUSAL WITNESSED BY Donia Ast RN  Performed at [facility], [place]  [address] [place], [place] [address]   Troponin I (High Sensitivity)     Status: Abnormal   Collection Time: 05/13/19  9:02 PM  Result Value Ref Range   Troponin I (High Sensitivity) 38 (H) <18 ng/L    Comment: (NOTE) Elevated high sensitivity troponin I (hsTnI) values and significant  changes across serial measurements may suggest ACS but many other  chronic and acute conditions are known to elevate hsTnI results.  Refer to the "Links" section for chest pain algorithms and additional  guidance. Performed at [facility], [place] [address], [place],  [address]   SARS Coronavirus 2 by RT PCR (hospital order, performed in [facility] hospital lab) Nasopharyngeal Nasopharyngeal Swab     Status: None   Collection Time: 05/13/19 10:14 PM   Specimen: Nasopharyngeal Swab  Result Value Ref Range   SARS Coronavirus 2 NEGATIVE NEGATIVE    Comment: (NOTE) If result is NEGATIVE SARS-CoV-2 target nucleic acids are NOT DETECTED. The SARS-CoV-2 RNA is generally detectable in upper and lower  respiratory specimens during the acute phase of infection. The lowest  concentration of SARS-CoV-2 viral copies this assay can detect is 250  copies / mL. A negative result does not preclude SARS-CoV-2 infection  and should not be used as the sole basis for treatment or other  patient management decisions.  A negative result may occur with  improper specimen collection / handling, submission of specimen other  than nasopharyngeal swab, presence of viral mutation(s) within the  areas targeted by this assay, and inadequate number of viral copies  (<250 copies / mL). A negative result must be combined with clinical  observations, patient history, and epidemiological information. If result is POSITIVE SARS-CoV-2 target nucleic acids are DETECTED. The SARS-CoV-2 RNA is generally detectable in upper and lower  respiratory specimens dur ing the acute phase of infection.  Positive  results are indicative of  active infection with SARS-CoV-2.  Clinical  correlation with patient history and other diagnostic information is  necessary to determine patient infection status.  Positive results do  not rule out bacterial infection or co-infection with other viruses. If result is PRESUMPTIVE POSTIVE SARS-CoV-2 nucleic acids MAY BE PRESENT.   A presumptive positive result was obtained on the submitted specimen  and confirmed on repeat testing.  While 2019 novel coronavirus  (  SARS-CoV-2) nucleic acids may be present in the submitted sample  additional confirmatory testing may be necessary for epidemiological  and / or clinical management purposes  to differentiate between  SARS-CoV-2 and other Sarbecovirus currently known to infect humans.  If clinically indicated additional testing with an alternate test  methodology [id number]) is advised. The SARS-CoV-2 RNA is generally  detectable in upper and lower respiratory sp ecimens during the acute  phase of infection. The expected result is Negative. Fact Sheet for Patients:  StrictlyIdeas.no Fact Sheet for Healthcare Providers: BankingDealers.co.za This test is not yet approved or cleared by the [place] FDA and has been authorized for detection and/or diagnosis of SARS-CoV-2 by FDA under an Emergency Use Authorization (EUA).  This EUA will remain in effect (meaning this test can be used) for the duration of the COVID-19 declaration under Section 564(b)(1) of the Act, 21 U.S.C. section 360bbb-3(b)(1), unless the authorization is terminated or revoked sooner. Performed at [facility], [place] [address] [place], [place] [address]   CBG monitoring, ED     Status: Abnormal   Collection Time: 05/13/19 11:38 PM  Result Value Ref Range   Glucose-Capillary 224 (H) 70 - 99 mg/dL  CBG monitoring, ED     Status: Abnormal   Collection Time: 05/14/19  1:19 AM  Result Value Ref Range   Glucose-Capillary 227  (H) 70 - 99 mg/dL  CBG monitoring, ED     Status: Abnormal   Collection Time: 05/14/19  2:25 AM  Result Value Ref Range   Glucose-Capillary 197 (H) 70 - 99 mg/dL  Renal function panel     Status: Abnormal   Collection Time: 05/14/19  3:40 AM  Result Value Ref Range   Sodium 132 (L) 135 - 145 mmol/L   Potassium 4.8 3.5 - 5.1 mmol/L   Chloride 89 (L) 98 - 111 mmol/L   CO2 16 (L) 22 - 32 mmol/L   Glucose, Bld 158 (H) 70 - 99 mg/dL   BUN 158 (H) 6 - 20 mg/dL   Creatinine, Ser 13.53 (H) 0.44 - 1.00 mg/dL   Calcium 7.8 (L) 8.9 - 10.3 mg/dL   Phosphorus 10.2 (H) 2.5 - 4.6 mg/dL   Albumin 2.9 (L) 3.5 - 5.0 g/dL   GFR calc non Af Amer 3 (L) >60 mL/min   GFR calc Af Amer 3 (L) >60 mL/min   Anion gap 27 (H) 5 - 15    Comment: Performed at [facility], [address], [place] [address]  CBC     Status: Abnormal   Collection Time: 05/14/19  3:40 AM  Result Value Ref Range   WBC 3.9 (L) 4.0 - 10.5 K/uL   RBC 1.47 (L) 3.87 - 5.11 MIL/uL   Hemoglobin 4.7 (LL) 12.0 - 15.0 g/dL    Comment: REPEATED TO VERIFY THIS CRITICAL RESULT HAS VERIFIED AND BEEN CALLED TO FLORES M RN BY SAINVIL SAINVILUS ON 10 09 2020 AT 0452, AND HAS BEEN READ BACK.     HCT 15.2 (L) 36.0 - 46.0 %   MCV 103.4 (H) 80.0 - 100.0 fL   MCH 32.0 26.0 - 34.0 pg   MCHC 30.9 30.0 - 36.0 g/dL   RDW 15.1 11.5 - 15.5 %   Platelets 102 (L) 150 - 400 K/uL    Comment: REPEATED TO VERIFY PLATELET COUNT CONFIRMED BY SMEAR Immature Platelet Fraction may be clinically indicated, consider ordering this additional test [id number]    nRBC 0.0 0.0 - 0.2 %  Comment: Performed at [facility], [place] [address] [place], [place] [address]  CBG monitoring, ED     Status: Abnormal   Collection Time: 05/14/19  3:48 AM  Result Value Ref Range   Glucose-Capillary 196 (H) 70 - 99 mg/dL  CBG monitoring, ED     Status: Abnormal   Collection Time: 05/14/19  5:04 AM  Result Value Ref Range   Glucose-Capillary 66 (L) 70 - 99  mg/dL  CBG monitoring, ED     Status: None   Collection Time: 05/14/19  5:38 AM  Result Value Ref Range   Glucose-Capillary 82 70 - 99 mg/dL  CBG monitoring, ED     Status: Abnormal   Collection Time: 05/14/19  6:34 AM  Result Value Ref Range   Glucose-Capillary 104 (H) 70 - 99 mg/dL   Comment 1 Notify RN    Dg Abd Acute W/chest  Result Date: 05/13/2019 CLINICAL DATA:  Chest pain, shortness of breath, nausea, and vomiting. EXAM: DG ABDOMEN ACUTE W/ 1V CHEST COMPARISON:  CT abdomen pelvis dated April 02, 2019. Chest x-ray dated April 01, 2019. FINDINGS: Unchanged moderate cardiomegaly. Normal pulmonary vascularity. No focal consolidation, pleural effusion, or pneumothorax. There is no evidence of dilated bowel loops or free intraperitoneal air. No radiopaque calculi or other significant radiographic abnormality is seen. No acute osseous abnormality. IMPRESSION: 1. No acute findings.  No active cardiopulmonary disease. 2. Unchanged moderate cardiomegaly. Electronically Signed   By: Titus Dubin M.D.   On: 05/13/2019 18:30    PMH:   Past Medical History:  Diagnosis Date  . Anemia   . Anxiety   . Bipolar disorder, unspecified ([place]) 02/06/2014  . Chronic combined systolic and diastolic CHF (congestive heart failure) (HCC)    EF 40% by echo and 48% by stress test  . Chronic pain syndrome   . Depression   . ESRD on hemodialysis (HCC)    TTS  . GERD (gastroesophageal reflux disease)   . Glaucoma   . Headache    sinus headaches  . Hepatitis C   . High cholesterol   . History of hiatal hernia   . History of vitamin D deficiency 06/20/2015  . Hypertension   . MRSA infection ?2014   "on the back of my head; spread throughout my bloodstream"  . Neuropathy   . Refusal of blood transfusions as patient is Jehovah's Witness    occasionally allows transfusions.    . Schizoaffective disorder, unspecified condition 02/08/2014  . Seizure [facility]) dx'd 2014   "don't know what kind; last one was ~  04/2014"  . TIA (transient ischemic attack) 2010  . Type II diabetes mellitus (HCC)    INSULIN DEPENDENT    PSH:   Past Surgical History:  Procedure Laterality Date  . ABDOMINAL HYSTERECTOMY  2014  . ABDOMINAL HYSTERECTOMY  2010  . AMPUTATION Left 05/21/2013   Procedure: Left Midfoot AMPUTATION;  Surgeon: Newt Minion, MD;  Location: [place];  Service: Orthopedics;  Laterality: Left;  Left Midfoot Amputation  . AV FISTULA PLACEMENT Left 05/04/2015   Procedure: ARTERIOVENOUS (AV) GRAFT INSERTION;  Surgeon: Angelia Mould, MD;  Location: [place];  Service: Vascular;  Laterality: Left;  . BIOPSY  11/18/2018   Procedure: BIOPSY;  Surgeon: Jerene Bears, MD;  Location: [facility] ENDOSCOPY;  Service: Gastroenterology;;  . COLONOSCOPY WITH PROPOFOL N/A 06/22/2013   Procedure: COLONOSCOPY WITH PROPOFOL;  Surgeon: Jeryl Columbia, MD;  Location: [facility] ENDOSCOPY;  Service: Endoscopy;  Laterality: N/A;  .  COLONOSCOPY WITH PROPOFOL N/A 11/18/2018   Procedure: COLONOSCOPY WITH PROPOFOL;  Surgeon: Jerene Bears, MD;  Location: [place];  Service: Gastroenterology;  Laterality: N/A;  . ENTEROSCOPY N/A 12/31/2018   Procedure: ENTEROSCOPY;  Surgeon: Irene Shipper, MD;  Location: [facility] ENDOSCOPY;  Service: Endoscopy;  Laterality: N/A;  Push enteroscopy at 8am (spoke with Endo)  . ESOPHAGOGASTRODUODENOSCOPY N/A 05/11/2015   Procedure: ESOPHAGOGASTRODUODENOSCOPY (EGD);  Surgeon: Laurence Spates, MD;  Location: [facility] ENDOSCOPY;  Service: Endoscopy;  Laterality: N/A;  . ESOPHAGOGASTRODUODENOSCOPY (EGD) WITH PROPOFOL N/A 11/18/2018   Procedure: ESOPHAGOGASTRODUODENOSCOPY (EGD) WITH PROPOFOL;  Surgeon: Jerene Bears, MD;  Location: [facility] ENDOSCOPY;  Service: Gastroenterology;  Laterality: N/A;  . EYE SURGERY Bilateral 2010   Lasik  . FLEXIBLE SIGMOIDOSCOPY N/A 01/17/2017   Procedure: FLEXIBLE SIGMOIDOSCOPY w/ hemorrhoid banding possible;  Surgeon: Gatha Mayer, MD;  Location: [facility] ENDOSCOPY;  Service: Endoscopy;  Laterality: N/A;   . FLEXIBLE SIGMOIDOSCOPY N/A 01/21/2019   Procedure: FLEXIBLE SIGMOIDOSCOPY;  Surgeon: Ronald Lobo, MD;  [facility];  Service: Endoscopy;  Laterality: N/A;  Plan to do the test unprepped, unsedated  . FOOT AMPUTATION Left    due diabetic neuropathy, could not feel ulcer on bottom of foot  . GIVENS CAPSULE STUDY N/A 01/21/2019   Procedure: GIVENS CAPSULE STUDY;  Surgeon: Ronald Lobo, MD;  Location: [facility] ENDOSCOPY;  Service: Endoscopy;  Laterality: N/A;  . POLYPECTOMY  11/18/2018   Procedure: POLYPECTOMY;  Surgeon: Jerene Bears, MD;  Location: [facility] ENDOSCOPY;  Service: Gastroenterology;;  . REFRACTIVE SURGERY Bilateral 2013  . TONSILLECTOMY  1970's  . TUBAL LIGATION  2000    Allergies:  Allergies  Allergen Reactions  . Penicillins Hives and Other (See Comments)    Has patient had a PCN reaction causing immediate rash, facial/tongue/throat swelling, SOB or lightheadedness with hypotension: Yes Has patient had a PCN reaction causing severe rash involving mucus membranes or skin necrosis: No Has patient had a PCN reaction that required hospitalization No Has patient had a PCN reaction occurring within the last 10 years: No If all of the above answers are "NO", then may proceed with Cephalosporin use. Pt tolerates cefepime and ceftriaxone  . Gabapentin Itching and Swelling  . Lyrica [Pregabalin] Swelling and Other (See Comments)    Facial and leg swelling  . Saphris [Asenapine] Swelling and Other (See Comments)    Facial swelling  . Tramadol Swelling and Other (See Comments)    Leg swelling  . Adhesive [Tape] Itching    SEVERE ITCHING  . Latex Itching    SEVERE ITCHING  . Other Other (See Comments)    NO BLOOD PRODUCTS; PATIENT IS A JEHOVAH'S WITNESS    Medications:   Prior to Admission medications   Medication Sig Start Date End Date Taking? Authorizing Provider  acetaminophen (TYLENOL) 325 MG tablet Take 2 tablets (650 mg total) by mouth every 6 (six) hours as  needed for fever, headache or moderate pain. 02/26/19  Yes Emokpae, Courage, MD  albuterol (PROVENTIL) (2.5 MG/3ML) 0.083% nebulizer solution Take 5 mg by nebulization daily as needed for wheezing or shortness of breath.   Yes [provider]  albuterol (VENTOLIN HFA) 108 (90 Base) MCG/ACT inhaler Inhale 2 puffs into the lungs every 4 (four) hours as needed for wheezing or shortness of breath. 01/26/19  Yes Dahal, Marlowe Aschoff, MD  ALPRAZolam (XANAX) 0.5 MG tablet Take 0.5 mg by mouth 3 (three) times daily as needed for anxiety. 02/16/19  Yes [provider]  amLODipine ([place])  2.5 MG tablet Take 2.5 mg by mouth daily. 03/16/19  Yes [provider]  atorvastatin (LIPITOR) 40 MG tablet Take 1 tablet (40 mg total) by mouth at bedtime. 02/26/19  Yes Roxan Hockey, MD  Darbepoetin Alfa (ARANESP) 200 MCG/0.4ML SOSY injection Inject 0.4 mLs (200 mcg total) into the vein every Wednesday with hemodialysis. 01/27/19  Yes Dahal, Marlowe Aschoff, MD  diphenhydrAMINE (BENADRYL) 25 mg capsule Take 1 capsule (25 mg total) by mouth every 6 (six) hours as needed for up to 8 days for itching. 01/26/19 02/25/28 Yes Dahal, Marlowe Aschoff, MD  furosemide (LASIX) 80 MG tablet Take 80 mg by mouth 2 (two) times daily.   Yes [provider]  hydrocortisone (ANUSOL-HC) 2.5 % rectal cream Place 1 application rectally 2 (two) times daily. 04/03/19  Yes Danford, Suann Larry, MD  hydrOXYzine (ATARAX/VISTARIL) 25 MG tablet Take 1 tablet (25 mg total) by mouth 3 (three) times daily as needed for itching or anxiety. 02/26/19  Yes Emokpae, Courage, MD  insulin aspart (NOVOLOG) 100 UNIT/ML injection 0-9 Units, Subcutaneous, 3 times daily with meals CBG < 70: implement hypoglycemia protocol CBG 70 - 120: 0 units CBG 121 - 150: 1 unit CBG 151 - 200: 2 units CBG 201 - 250: 3 units CBG 251 - 300: 5 units CBG 301 - 350: 7 units CBG 351 - 400: 9 units CBG > 400: call MD Patient taking differently: Inject 0-9 Units into the skin 3  (three) times daily with meals. 0-9 Units, Subcutaneous, 3 times daily with meals CBG < 70: implement hypoglycemia protocol CBG 70 - 120: 0 units CBG 121 - 150: 1 unit CBG 151 - 200: 2 units CBG 201 - 250: 3 units CBG 251 - 300: 5 units CBG 301 - 350: 7 units CBG 351 - 400: 9 units CBG > 400: call MD 01/26/19  Yes Dahal, Marlowe Aschoff, MD  insulin glargine (LANTUS) 100 UNIT/ML injection Inject 24 Units into the skin at bedtime.   Yes [provider]  lidocaine-prilocaine (EMLA) cream Apply 1 application topically See admin instructions. On dialysis days 12/23/18  Yes [provider]  methocarbamol (ROBAXIN) 500 MG tablet Take 500 mg by mouth daily as needed for muscle spasms.   Yes [provider]  mupirocin ointment (BACTROBAN) 2 % Apply 1 application topically as needed (on arm). 04/03/19  Yes Danford, Suann Larry, MD  Oxycodone HCl 10 MG TABS Take 1 tablet (10 mg total) by mouth 4 (four) times daily. 04/03/19  Yes Danford, Suann Larry, MD  oxymetazoline (AFRIN) 0.05 % nasal spray Place 2 sprays into both nostrils 2 (two) times daily. 01/26/19  Yes Dahal, Marlowe Aschoff, MD  pantoprazole (PROTONIX) 40 MG tablet Take 1 tablet (40 mg total) by mouth daily at 6 (six) AM for 30 days. 01/26/19 05/13/19 Yes Dahal, Marlowe Aschoff, MD  Pramoxine-Menthol-Petrolatum (SARNA ULTRA EX) Apply 1 application topically daily as needed (itch).   Yes [provider]  senna-docusate (SENOKOT-S) 8.6-50 MG tablet Take 2 tablets by mouth at bedtime. 02/26/19 02/26/20 Yes Emokpae, Courage, MD  traZODone (DESYREL) 50 MG tablet Take 0.5 tablets (25 mg total) by mouth at bedtime. 02/26/19 05/13/19 Yes Emokpae, Courage, MD  allopurinol (ZYLOPRIM) 100 MG tablet Take 2 tablets (200 mg total) by mouth daily. 02/26/19 04/01/19  Roxan Hockey, MD    Discontinued Meds:   Medications Discontinued During This Encounter  Medication Reason  . benzonatate (TESSALON) 200 MG capsule Patient has not taken in last 30 days  .  DULoxetine (CYMBALTA) 30 MG  capsule Patient has not taken in last 30 days  . labetalol (NORMODYNE) 200 MG tablet Patient has not taken in last 30 days  . pantoprazole (PROTONIX) injection 40 mg   . insulin regular, human (MYXREDLIN) 100 units/ 100 mL infusion     Social History:  reports that she has been smoking cigarettes. She has a 36.00 pack-year smoking history. She has never used smokeless tobacco. She reports current alcohol use of about 3.0 standard drinks of alcohol per week. She reports previous drug use. Drugs: "Crack" cocaine and Cocaine.  Family History:   Family History  Problem Relation Age of Onset  . Hypertension Mother   . Diabetes Mother   . Hypertension Father   . Diabetes Father     Blood pressure 135/72, pulse 80, temperature 98.6 F (37 C), temperature source Oral, resp. rate 17, SpO2 96 %. General appearance: alert, cooperative and appears stated age Head: Normocephalic, without obvious abnormality, atraumatic Eyes: negative Neck: no adenopathy, no carotid bruit, supple, symmetrical, trachea midline and thyroid not enlarged, symmetric, no tenderness/mass/nodules Back: symmetric, no curvature. ROM normal. No CVA tenderness. Resp: clear to auscultation bilaterally Chest wall: no tenderness Cardio: regular rate and rhythm GI: soft, non-tender; bowel sounds normal; no masses,  no organomegaly Extremities: lt TMA Pulses: 2+ and symmetric Skin: Skin color, texture, turgor normal. No rashes or lesions Access: Lt FAL AVG pulsatile       [name], Hunt Oris, MD 05/14/2019, 6:51 AM

## 2019-05-14 NOTE — ED Notes (Signed)
Patient transported to CT 

## 2019-05-14 NOTE — ED Notes (Signed)
Glucostabilizer rate was due to be changed. Patient's CBG was 124. Insulin drip is to be held at this time. Patient currently in CT.

## 2019-05-15 ENCOUNTER — Inpatient Hospital Stay (HOSPITAL_COMMUNITY): Payer: Medicare Other | Admitting: Certified Registered Nurse Anesthetist

## 2019-05-15 ENCOUNTER — Encounter (HOSPITAL_COMMUNITY)
Admission: EM | Disposition: A | Discharge status: Home / Self Care | Payer: Self-pay | Source: Home / Self Care | Attending: Internal Medicine

## 2019-05-15 ENCOUNTER — Encounter (HOSPITAL_COMMUNITY): Payer: Self-pay | Admitting: Certified Registered Nurse Anesthetist

## 2019-05-15 DIAGNOSIS — D500 Iron deficiency anemia secondary to blood loss (chronic): Secondary | ICD-10-CM

## 2019-05-15 HISTORY — PX: ESOPHAGOGASTRODUODENOSCOPY (EGD) WITH PROPOFOL: SHX5813

## 2019-05-15 LAB — CBC WITH DIFFERENTIAL/PLATELET
Abs Immature Granulocytes: 0.03 10*3/uL (ref 0.00–0.07)
Basophils Absolute: 0 10*3/uL (ref 0.0–0.1)
Basophils Relative: 0 %
Eosinophils Absolute: 0.1 10*3/uL (ref 0.0–0.5)
Eosinophils Relative: 1 %
HCT: 22.8 % — ABNORMAL LOW (ref 36.0–46.0)
Hemoglobin: 7.9 g/dL — ABNORMAL LOW (ref 12.0–15.0)
Immature Granulocytes: 1 %
Lymphocytes Relative: 10 %
Lymphs Abs: 0.6 10*3/uL — ABNORMAL LOW (ref 0.7–4.0)
MCH: 31.6 pg (ref 26.0–34.0)
MCHC: 34.6 g/dL (ref 30.0–36.0)
MCV: 91.2 fL (ref 80.0–100.0)
Monocytes Absolute: 0.6 10*3/uL (ref 0.1–1.0)
Monocytes Relative: 10 %
Neutro Abs: 4.5 10*3/uL (ref 1.7–7.7)
Neutrophils Relative %: 78 %
Platelets: 108 10*3/uL — ABNORMAL LOW (ref 150–400)
RBC: 2.5 MIL/uL — ABNORMAL LOW (ref 3.87–5.11)
RDW: 16.8 % — ABNORMAL HIGH (ref 11.5–15.5)
WBC: 5.7 10*3/uL (ref 4.0–10.5)
nRBC: 0 % (ref 0.0–0.2)

## 2019-05-15 LAB — BASIC METABOLIC PANEL
Anion gap: 13 (ref 5–15)
Anion gap: 16 — ABNORMAL HIGH (ref 5–15)
BUN: 51 mg/dL — ABNORMAL HIGH (ref 6–20)
BUN: 53 mg/dL — ABNORMAL HIGH (ref 6–20)
CO2: 24 mmol/L (ref 22–32)
CO2: 19 mmol/L — ABNORMAL LOW (ref 22–32)
Calcium: 7.5 mg/dL — ABNORMAL LOW (ref 8.9–10.3)
Calcium: 7.7 mg/dL — ABNORMAL LOW (ref 8.9–10.3)
Chloride: 98 mmol/L (ref 98–111)
Chloride: 98 mmol/L (ref 98–111)
Creatinine, Ser: 6.09 mg/dL — ABNORMAL HIGH (ref 0.44–1.00)
Creatinine, Ser: 6.67 mg/dL — ABNORMAL HIGH (ref 0.44–1.00)
GFR calc Af Amer: 8 mL/min — ABNORMAL LOW (ref >60)
GFR calc Af Amer: 8 mL/min — ABNORMAL LOW (ref >60)
GFR calc non Af Amer: 7 mL/min — ABNORMAL LOW (ref >60)
GFR calc non Af Amer: 7 mL/min — ABNORMAL LOW (ref >60)
Glucose, Bld: 157 mg/dL — ABNORMAL HIGH (ref 70–99)
Glucose, Bld: 179 mg/dL — ABNORMAL HIGH (ref 70–99)
Potassium: 4.6 mmol/L (ref 3.5–5.1)
Potassium: 4.7 mmol/L (ref 3.5–5.1)
Sodium: 135 mmol/L (ref 135–145)
Sodium: 133 mmol/L — ABNORMAL LOW (ref 135–145)

## 2019-05-15 LAB — GLUCOSE, CAPILLARY
Glucose-Capillary: 115 mg/dL — ABNORMAL HIGH (ref 70–99)
Glucose-Capillary: 126 mg/dL — ABNORMAL HIGH (ref 70–99)
Glucose-Capillary: 130 mg/dL — ABNORMAL HIGH (ref 70–99)
Glucose-Capillary: 134 mg/dL — ABNORMAL HIGH (ref 70–99)
Glucose-Capillary: 136 mg/dL — ABNORMAL HIGH (ref 70–99)
Glucose-Capillary: 136 mg/dL — ABNORMAL HIGH (ref 70–99)
Glucose-Capillary: 143 mg/dL — ABNORMAL HIGH (ref 70–99)
Glucose-Capillary: 144 mg/dL — ABNORMAL HIGH (ref 70–99)
Glucose-Capillary: 151 mg/dL — ABNORMAL HIGH (ref 70–99)
Glucose-Capillary: 154 mg/dL — ABNORMAL HIGH (ref 70–99)
Glucose-Capillary: 193 mg/dL — ABNORMAL HIGH (ref 70–99)
Glucose-Capillary: 193 mg/dL — ABNORMAL HIGH (ref 70–99)
Glucose-Capillary: 250 mg/dL — ABNORMAL HIGH (ref 70–99)

## 2019-05-15 LAB — HEMOGLOBIN AND HEMATOCRIT, BLOOD
HCT: 17.6 % — ABNORMAL LOW (ref 36.0–46.0)
Hemoglobin: 5.7 g/dL — CL (ref 12.0–15.0)

## 2019-05-15 LAB — PREPARE RBC (CROSSMATCH)

## 2019-05-15 LAB — MRSA PCR SCREENING: MRSA by PCR: POSITIVE — AB

## 2019-05-15 SURGERY — ESOPHAGOGASTRODUODENOSCOPY (EGD) WITH PROPOFOL
Anesthesia: Monitor Anesthesia Care

## 2019-05-15 MED ORDER — FENTANYL CITRATE (PF) 100 MCG/2ML IJ SOLN: 25.0000 ug | INTRAMUSCULAR | Status: DC | PRN

## 2019-05-15 MED ORDER — DIPHENHYDRAMINE-ZINC ACETATE 2-0.1 % EX CREA
TOPICAL_CREAM | Freq: Three times a day (TID) | CUTANEOUS | Status: DC | PRN
  Filled 2019-05-15: qty 28

## 2019-05-15 MED ORDER — PROPOFOL 500 MG/50ML IV EMUL
INTRAVENOUS | Status: DC | PRN
  Administered 2019-05-15: 100 ug/kg/min via INTRAVENOUS

## 2019-05-15 MED ORDER — INSULIN ASPART 100 UNIT/ML ~~LOC~~ SOLN
0.0000 [IU] | Freq: Three times a day (TID) | SUBCUTANEOUS | Status: DC
  Administered 2019-05-16: 5 [IU] via SUBCUTANEOUS

## 2019-05-15 MED ORDER — INSULIN ASPART 100 UNIT/ML ~~LOC~~ SOLN
0.0000 [IU] | Freq: Every day | SUBCUTANEOUS | Status: DC
  Administered 2019-05-15: 3 [IU] via SUBCUTANEOUS

## 2019-05-15 MED ORDER — SODIUM CHLORIDE 0.9% IV SOLUTION
Freq: Once | INTRAVENOUS | Status: AC
  Administered 2019-05-15: 04:00:00 via INTRAVENOUS

## 2019-05-15 MED ORDER — INSULIN GLARGINE 100 UNIT/ML ~~LOC~~ SOLN
10.0000 [IU] | Freq: Every day | SUBCUTANEOUS | Status: DC
  Administered 2019-05-15 – 2019-05-16 (×2): 10 [IU] via SUBCUTANEOUS
  Filled 2019-05-15 (×2): qty 0.1

## 2019-05-15 SURGICAL SUPPLY — 15 items

## 2019-05-15 NOTE — Progress Notes (Signed)
PROGRESS NOTE    [name]  VPX:[id number] DOB: 1965/11/30 DOA: 05/13/2019 PCP: Rocco Serene, MD    Brief Narrative:  53 y.o. female with history of ESRD on hemodialysis, hypertension, diabetes mellitus, recurrent GI bleed admitted in August 2020 with GI bleed presents to the ER because of shortness of breath and had missed her dialysis for last 1 week.  Patient states that over the last 5 days patient also has been nausea vomiting and diarrhea.  And left flank pain.  Denies any fever chills productive cough has had some chest discomfort on throwing up.  ED Course: In the ER patient was noticed to have hemoglobin of 5.7 which is a drop of 3 g from August at the time it was 8.3.  Patient is 114.  This is unremarkable.  Metabolic panel shows blood glucose of 380 bicarb 19 anion gap 26.  Stool for occult blood was positive and was melanotic.  COVID-19 test is negative.  On-call nephrologist was consulted.  Given the GI bleed on-call gastroenterology Dr. Havery Moros was also notified advised to start patient on Protonix infusion.  Patient is Jehovah's Witness and at this time is refused blood transfusion but during previous admission has agreed for transfusion.  Given the DKA picture patient was started on insulin infusion without any IV fluid bolus.  On exam patient has mild tenderness of the left lower quadrant for which CT abdomen and lactic acid has been ordered.  Assessment & Plan:   Principal Problem:   Acute GI bleeding Active Problems:   Seizure disorder (HCC)   ESRD (end stage renal disease) (HCC)   Prolonged QT interval   Acute blood loss anemia   DKA (diabetic ketoacidoses) ([place])  1. Acute GI bleeding given the melanotic stool patient is suspected to have upper GI bleed.   1. Patient has had extensive GI study this year with EGD colonoscopy and enteroscopy capsule study.   2. Pt was continued on protonix gtt, changed to PO by GI 3. On 10/9, pt noted to have large melanotic  stools. Pt is Jehova's witness and thus had been refusing blood transfusion 4. Pt later consented to blood transfusion, and has since received total 4 units of PRBC's 5. Will follow serial H/H and try to minimize blood draw if possible 6. Currently on protonix IV BID 7. Pt now s/p repeat EGD with normal findings 2. Acute respiratory failure with hypoxia secondary to patient missing dialysis. 1. Nephrology following 2. Pt to continue HD as tolerated 3. Diabetic ketoacidosis 1. Pt had remained on IV insulin infusion 2. GAP has improved. Glucose trends improved 3. Would transition to subq insulin 4. Hypertension 1. Pt is continued on PRN IV hydralazine. 2. BP remains stable thus far 5. Prolonged QT interval  1. Cont to avoid QT prolonging medications. 6. Acute blood loss anemia 1. Pt had agreed to blood transfusion on 10/9 2. Thus far, pt has received total 4 units PRBC's 3. Hemodynamically stable 7. Possible scabies 1. Pt reports possible scabies exposure with diffuse pruritic rash across body, most notably across torso, shoulders, scalp. Also itchy between fingers, however no discernable rash 2. Pt is continued on empiric permethrin cream 3. Have added benadryl cream for symptom relief  DVT prophylaxis: SCD's Code Status: Full Family Communication: Pt in room, family at bedside Disposition Plan: Uncertain at this time  Consultants:   Nephrology  GI  Procedures:   EGD 10/10  Antimicrobials: Anti-infectives (From admission, onward)   None  Subjective: Complains of itching and pain where pt has been scratching over scalp  Objective: Vitals:   05/15/19 1235 05/15/19 1326 05/15/19 1335 05/15/19 1347  BP: (!) 189/57 (!) 152/55 (!) 128/39 (!) 137/39  Pulse: 89 89 91 87  Resp: 14 19 18 16   Temp: 97.7 F (36.5 C)     TempSrc: Temporal     SpO2: 96% 100% 100% 100%  Weight: 77.6 kg     Height: 5\' 4"  (1.626 m)       Intake/Output Summary (Last 24 hours) at  05/15/2019 1608 Last data filed at 05/15/2019 1319 Gross per 24 hour  Intake 1731.49 ml  Output -  Net 1731.49 ml   Filed Weights   05/14/19 1339 05/15/19 1235  Weight: 77.6 kg 77.6 kg    Examination: General exam: Conversant, in no acute distress Respiratory system: normal chest rise, clear, no audible wheezing Cardiovascular system: regular rhythm, s1-s2 Gastrointestinal system: Nondistended, nontender, pos BS Central nervous system: No seizures, no tremors Extremities: No cyanosis, no joint deformities Skin: Diffuse rash over scalp, shoulders Psychiatry: Affect normal // no auditory hallucinations   Data Reviewed: I have personally reviewed following labs and imaging studies  CBC: Recent Labs  Lab 05/13/19 1812 05/14/19 0340 05/15/19 0230 05/15/19 1208  WBC 3.7* 3.9*  --  5.7  NEUTROABS 2.9  --   --  4.5  HGB 5.7* 4.7* 5.7* 7.9*  HCT 18.0* 15.2* 17.6* 22.8*  MCV 101.1* 103.4*  --  91.2  PLT 114* 102*  --  759*   Basic Metabolic Panel: Recent Labs  Lab 05/13/19 1812 05/14/19 0340 05/15/19 0230 05/15/19 1208  NA 128* 132* 135 133*  K 5.0 4.8 4.6 4.7  CL 83* 89* 98 98  CO2 19* 16* 24 19*  GLUCOSE 380* 158* 157* 179*  BUN 155* 158* 51* 53*  CREATININE 13.30* 13.53* 6.09* 6.67*  CALCIUM 8.2* 7.8* 7.5* 7.7*  PHOS  --  10.2*  --   --    GFR: Estimated Creatinine Clearance: 10 mL/min (A) (by C-G formula based on SCr of 6.67 mg/dL (H)). Liver Function Tests: Recent Labs  Lab 05/13/19 1812 05/14/19 0340  AST 43*  --   ALT 63*  --   ALKPHOS 93  --   BILITOT 1.2  --   PROT 6.9  --   ALBUMIN 3.4* 2.9*   Recent Labs  Lab 05/13/19 1812  LIPASE 66*   No results for input(s): AMMONIA in the last 168 hours. Coagulation Profile: No results for input(s): INR, PROTIME in the last 168 hours. Cardiac Enzymes: No results for input(s): CKTOTAL, CKMB, CKMBINDEX, TROPONINI in the last 168 hours. BNP (last 3 results) No results for input(s): PROBNP in the last  8760 hours. HbA1C: No results for input(s): HGBA1C in the last 72 hours. CBG: Recent Labs  Lab 05/15/19 0653 05/15/19 0749 05/15/19 0910 05/15/19 1018 05/15/19 1156  GLUCAP 136* 126* 115* 136* 144*   Lipid Profile: No results for input(s): CHOL, HDL, LDLCALC, TRIG, CHOLHDL, LDLDIRECT in the last 72 hours. Thyroid Function Tests: No results for input(s): TSH, T4TOTAL, FREET4, T3FREE, THYROIDAB in the last 72 hours. Anemia Panel: No results for input(s): VITAMINB12, FOLATE, FERRITIN, TIBC, IRON, RETICCTPCT in the last 72 hours. Sepsis Labs: Recent Labs  Lab 05/14/19 1305  LATICACIDVEN 2.5*    Recent Results (from the past 240 hour(s))  SARS Coronavirus 2 by RT PCR (hospital order, performed in [facility] hospital lab) Nasopharyngeal Nasopharyngeal Swab  Status: None   Collection Time: 05/13/19 10:14 PM   Specimen: Nasopharyngeal Swab  Result Value Ref Range Status   SARS Coronavirus 2 NEGATIVE NEGATIVE Final    Comment: (NOTE) If result is NEGATIVE SARS-CoV-2 target nucleic acids are NOT DETECTED. The SARS-CoV-2 RNA is generally detectable in upper and lower  respiratory specimens during the acute phase of infection. The lowest  concentration of SARS-CoV-2 viral copies this assay can detect is 250  copies / mL. A negative result does not preclude SARS-CoV-2 infection  and should not be used as the sole basis for treatment or other  patient management decisions.  A negative result may occur with  improper specimen collection / handling, submission of specimen other  than nasopharyngeal swab, presence of viral mutation(s) within the  areas targeted by this assay, and inadequate number of viral copies  (<250 copies / mL). A negative result must be combined with clinical  observations, patient history, and epidemiological information. If result is POSITIVE SARS-CoV-2 target nucleic acids are DETECTED. The SARS-CoV-2 RNA is generally detectable in upper and lower   respiratory specimens dur ing the acute phase of infection.  Positive  results are indicative of active infection with SARS-CoV-2.  Clinical  correlation with patient history and other diagnostic information is  necessary to determine patient infection status.  Positive results do  not rule out bacterial infection or co-infection with other viruses. If result is PRESUMPTIVE POSTIVE SARS-CoV-2 nucleic acids MAY BE PRESENT.   A presumptive positive result was obtained on the submitted specimen  and confirmed on repeat testing.  While 2019 novel coronavirus  (SARS-CoV-2) nucleic acids may be present in the submitted sample  additional confirmatory testing may be necessary for epidemiological  and / or clinical management purposes  to differentiate between  SARS-CoV-2 and other Sarbecovirus currently known to infect humans.  If clinically indicated additional testing with an alternate test  methodology [id number]) is advised. The SARS-CoV-2 RNA is generally  detectable in upper and lower respiratory sp ecimens during the acute  phase of infection. The expected result is Negative. Fact Sheet for Patients:  StrictlyIdeas.no Fact Sheet for Healthcare Providers: BankingDealers.co.za This test is not yet approved or cleared by the [place] FDA and has been authorized for detection and/or diagnosis of SARS-CoV-2 by FDA under an Emergency Use Authorization (EUA).  This EUA will remain in effect (meaning this test can be used) for the duration of the COVID-19 declaration under Section 564(b)(1) of the Act, 21 U.S.C. section 360bbb-3(b)(1), unless the authorization is terminated or revoked sooner. Performed at [facility], [place] [address], [place], [place] [address]   MRSA PCR Screening     Status: Abnormal   Collection Time: 05/15/19  8:49 AM   Specimen: Nasopharyngeal  Result Value Ref Range Status   MRSA by PCR POSITIVE (A) NEGATIVE  Final    Comment:        The GeneXpert MRSA Assay (FDA approved for NASAL specimens only), is one component of a comprehensive MRSA colonization surveillance program. It is not intended to diagnose MRSA infection nor to guide or monitor treatment for MRSA infections. RESULT CALLED TO, READ BACK BY AND VERIFIED WITH: RN CRYSTAL M. [id number] FCP Performed at [facility], [place] [address], [place], [place] [address]      Radiology Studies: Dg Abd Acute W/chest  Result Date: 05/13/2019 CLINICAL DATA:  Chest pain, shortness of breath, nausea, and vomiting. EXAM: DG ABDOMEN ACUTE W/ 1V CHEST COMPARISON:  CT abdomen pelvis dated April 02, 2019. Chest x-ray dated April 01, 2019. FINDINGS: Unchanged moderate cardiomegaly. Normal pulmonary vascularity. No focal consolidation, pleural effusion, or pneumothorax. There is no evidence of dilated bowel loops or free intraperitoneal air. No radiopaque calculi or other significant radiographic abnormality is seen. No acute osseous abnormality. IMPRESSION: 1. No acute findings.  No active cardiopulmonary disease. 2. Unchanged moderate cardiomegaly. Electronically Signed   By: Titus Dubin M.D.   On: 05/13/2019 18:30   Ct Renal Stone Study  Result Date: 05/14/2019 CLINICAL DATA:  Left lower abdominal/flank pain, nausea, vomiting, diarrhea. End-stage renal disease on hemodialysis. Anemia. EXAM: CT ABDOMEN AND PELVIS WITHOUT CONTRAST TECHNIQUE: Multidetector CT imaging of the abdomen and pelvis was performed following the standard protocol without IV contrast. COMPARISON:  01/22/2019 CT abdomen/pelvis. FINDINGS: Lower chest: Small dependent left pleural effusion. Cardiomegaly. Low cardiac blood pool density. Coronary atherosclerosis. Hepatobiliary: Diffusely finely irregular liver surface compatible with hepatic cirrhosis. No liver masses. Cholelithiasis. Contracted gallbladder. No gallbladder wall thickening. No biliary ductal dilatation. Pancreas:  Diffuse mild peripancreatic fat stranding. No pancreatic duct dilation or discrete mass. Spleen: Mild splenomegaly (craniocaudal splenic length 13.0 cm), slightly increased. No splenic mass. Adrenals/Urinary Tract: Normal adrenals. A 2 mm calcification in the interpolar right kidney could represent vascular calcification or nonobstructing stone. No hydronephrosis. No contour deforming renal masses. Collapsed and normal bladder. Stomach/Bowel: Normal non-distended stomach. Normal caliber small bowel with no small bowel wall thickening. Normal appendix. Oral contrast transits to the distal large bowel. No definite large bowel wall thickening or significant diverticulosis. Vascular/Lymphatic: Atherosclerotic nonaneurysmal abdominal aorta. Mild bilateral external iliac lymphadenopathy measuring up to 1.1 cm on the left (series 3/image 75), new since 01/22/2027. No pathologically enlarged abdominal nodes. Reproductive: Status post hysterectomy, with no abnormal findings at the vaginal cuff. No adnexal mass. Other: No pneumoperitoneum. Trace pelvic ascites. No focal fluid collection. Mild anasarca. Musculoskeletal: No aggressive appearing focal osseous lesions. Faint sclerosis throughout the skeleton compatible with renal osteodystrophy. IMPRESSION: 1. Diffuse mild peripancreatic fat stranding, cannot exclude acute pancreatitis. 2. Cholelithiasis. No evidence of acute cholecystitis. No biliary or pancreatic duct dilation. 3. Cirrhosis.  Mild splenomegaly. 4. Small dependent left pleural effusion. Trace pelvic ascites. Mild anasarca. 5. No evidence of bowel obstruction or acute bowel inflammation. 6. Nonspecific mild bilateral external iliac lymphadenopathy, new since 01/22/2019 CT. Suggest attention on follow-up CT abdomen/pelvis with oral and IV contrast in 3 months. This recommendation follows ACR consensus guidelines: White Paper of the ACR Incidental Findings Committee II on Splenic and Nodal Findings. J Am Coll  Radiol 3295;18:841-660. 7. Low cardiac blood pool density, compatible with anemia. 8.  Aortic Atherosclerosis (ICD10-I70.0). Electronically Signed   By: Ilona Sorrel M.D.   On: 05/14/2019 08:38    Scheduled Meds: . [MAR Hold] Chlorhexidine Gluconate Cloth  6 each Topical Q0600  . [MAR Hold] pantoprazole (PROTONIX) IV  40 mg Intravenous Q12H   Continuous Infusions: . sodium chloride 10 mL/hr at 05/13/19 2352  . [MAR Hold] sodium chloride    . [MAR Hold] sodium chloride    . dextrose 5 % and 0.45% NaCl 50 mL/hr at 05/14/19 1249  . insulin 0.5 Units/hr (05/15/19 1303)     LOS: 2 days   Marylu Lund, MD [facility] Pager On Amion  If 7PM-7AM, please contact night-coverage 05/15/2019, 4:08 PM

## 2019-05-15 NOTE — Anesthesia Postprocedure Evaluation (Signed)
Anesthesia Post Note  Patient: WESLYN HOLSONBACK  Procedure(s) Performed: ESOPHAGOGASTRODUODENOSCOPY (EGD) WITH PROPOFOL (N/A )     Patient location during evaluation: Endoscopy Anesthesia Type: MAC Level of consciousness: awake and alert Pain management: pain level controlled Vital Signs Assessment: post-procedure vital signs reviewed and stable Respiratory status: spontaneous breathing, nonlabored ventilation, respiratory function stable and patient connected to nasal cannula oxygen Cardiovascular status: stable and blood pressure returned to baseline Postop Assessment: no apparent nausea or vomiting Anesthetic complications: no    Last Vitals:  Vitals:   05/15/19 1335 05/15/19 1347  BP: (!) 128/39 (!) 137/39  Pulse: 91 87  Resp: 18 16  Temp:    SpO2: 100% 100%    Last Pain:  Vitals:   05/15/19 1326  TempSrc:   PainSc: 0-No pain                 Catalina Gravel

## 2019-05-15 NOTE — Progress Notes (Signed)
  [facility] Progress Note   Assessment/ Plan:    HD orders at [facility] MWF 3hr 30 min 160NRe thru  Lt FAL AVG  EDW 74kg 2/2 bath UFR Profile 4 Mircera 225 q2weeks last given 9/30  Calcitriol 2.5 PO TIW  1. ESRD - last HD was 9/30 and only on for 2hr 79min leaving 1.2 L above EDW of 74kg. She has gotten close to her EDW previously.  HD yesterday and again today 10/10. 2. Acute GI bleeding- got mult transfusions, still bleeding, Hgb 5.7, 2 more ordered for today.  Gi c/s.  EGD for today 3. Bone/ mineral- phos binders when eating. 4. HTN/ volume- above EDW- will dialyze again today expect this to improve 5. Scabies- has permethrin 6. Dispo: pending  Subjective:    Continues to bleed.  S/p HD yesterday and again today as she has underdialyzed for a week.     Objective:   BP (!) 148/72   Pulse (!) 44   Temp 98.7 F (37.1 C) (Oral)   Resp (!) 7   Ht 5\' 4"  (1.626 m)   Wt 77.6 kg   LMP  (LMP Unknown)   SpO2 (!) 82%   BMI 29.37 kg/m   Physical Exam: Gen: sitting up in bed, wearing O2 CVS: tachycardic Resp: bibasilar crackles Abd: mild tenderness LLQ Ext:2+ LE edema, facial fullness ACCESS: AVF  Labs: BMET Recent Labs  Lab 05/13/19 1812 05/14/19 0340 05/15/19 0230  NA 128* 132* 135  K 5.0 4.8 4.6  CL 83* 89* 98  CO2 19* 16* 24  GLUCOSE 380* 158* 157*  BUN 155* 158* 51*  CREATININE 13.30* 13.53* 6.09*  CALCIUM 8.2* 7.8* 7.5*  PHOS  --  10.2*  --    CBC Recent Labs  Lab 05/13/19 1812 05/14/19 0340 05/15/19 0230  WBC 3.7* 3.9*  --   NEUTROABS 2.9  --   --   HGB 5.7* 4.7* 5.7*  HCT 18.0* 15.2* 17.6*  MCV 101.1* 103.4*  --   PLT 114* 102*  --     @IMGRELPRIORS @ Medications:    . Chlorhexidine Gluconate Cloth  6 each Topical Q0600  . pantoprazole (PROTONIX) IV  40 mg Intravenous Q12H     Madelon Lips, MD 05/15/2019, 11:48 AM

## 2019-05-15 NOTE — Progress Notes (Signed)
CRITICAL VALUE ALERT  Critical Value:  hbg 5.7  Date & Time Notied:  10/10 /20  0300  Provider Notified: Lennox Grumbles   Orders Received/Actions taken: Transfuse 2units of PRBC

## 2019-05-15 NOTE — Op Note (Signed)
[facility] Patient Name: [name] Procedure Date : 05/15/2019 MRN: [id number] Attending MD: Jackquline Denmark , MD Date of Birth: 1966-02-09 CSN: [id number] Age: 53 Admit Type: Inpatient Procedure:                Upper GI endoscopy Indications:              Iron deficiency anemia secondary to chronic blood                            loss, Suspected upper gastrointestinal bleeding Providers:                Jackquline Denmark, MD, Grace Isaac, RN, Laverda Sorenson,                            Technician, Janet Berlin, CRNA Referring MD:              Medicines:                Monitored Anesthesia Care Complications:            No immediate complications. Estimated Blood Loss:     Estimated blood loss: none. Procedure:                Pre-Anesthesia Assessment:                           - Prior to the procedure, a History and Physical                            was performed, and patient medications and                            allergies were reviewed. The patient's tolerance of                            previous anesthesia was also reviewed. The risks                            and benefits of the procedure and the sedation                            options and risks were discussed with the patient.                            All questions were answered, and informed consent                            was obtained. Prior Anticoagulants: The patient has                            taken no previous anticoagulant or antiplatelet                            agents. ASA Grade Assessment: IV - A patient with  severe systemic disease that is a constant threat                            to life. After reviewing the risks and benefits,                            the patient was deemed in satisfactory condition to                            undergo the procedure.                           After obtaining informed consent, the endoscope was    passed under direct vision. Throughout the                            procedure, the patient's blood pressure, pulse, and                            oxygen saturations were monitored continuously. The                            [id number]-[id number] ([id number] Olympus gastroscope was                            introduced through the mouth, and advanced to the                            second part of duodenum. The upper GI endoscopy was                            accomplished without difficulty. The patient                            tolerated the procedure well. Scope In: Scope Out: Findings:      The examined esophagus was normal.      The entire examined stomach was normal.      The examined duodenum was normal. Impression:               - Normal EGD Recommendation:           - Return patient to hospital ward for ongoing care.                           - Resume previous diet.                           - Continue present medications.                           - No aspirin, ibuprofen, naproxen, or other                            non-steroidal anti-inflammatory drugs. Procedure Code(s):        --- Professional ---  29562, Esophagogastroduodenoscopy, flexible,                            transoral; diagnostic, including collection of                            specimen(s) by brushing or washing, when performed                            (separate procedure) Diagnosis Code(s):        --- Professional ---                           D50.0, Iron deficiency anemia secondary to blood                            loss (chronic) CPT copyright 2019 American Medical Association. All rights reserved. The codes documented in this report are preliminary and upon coder review may  be revised to meet current compliance requirements. Jackquline Denmark, MD 05/15/2019 1:38:56 PM This report has been signed electronically. Number of Addenda: 0

## 2019-05-15 NOTE — Anesthesia Preprocedure Evaluation (Signed)
Anesthesia Evaluation  Patient identified by MRN, date of birth, ID band Patient awake    Reviewed: Allergy & Precautions, NPO status , Patient's Chart, lab work & pertinent test results  Airway Mallampati: II  TM Distance: >3 FB Neck ROM: Full    Dental  (+) Dental Advisory Given, Missing   Pulmonary Current SmokerPatient did not abstain from smoking.,    Pulmonary exam normal breath sounds clear to auscultation       Cardiovascular hypertension, Pt. on medications +CHF  Normal cardiovascular exam Rhythm:Regular Rate:Normal     Neuro/Psych  Headaches, Seizures -,  PSYCHIATRIC DISORDERS Anxiety Depression Bipolar Disorder Schizophrenia TIACVA    GI/Hepatic hiatal hernia, GERD  ,(+) Hepatitis -, C  Endo/Other  diabetes, Type 2, Oral Hypoglycemic Agents, Insulin Dependent  Renal/GU ESRF and DialysisRenal disease     Musculoskeletal negative musculoskeletal ROS (+)   Abdominal   Peds  Hematology  (+) Blood dyscrasia, anemia ,   Anesthesia Other Findings Day of surgery medications reviewed with the patient.  Reproductive/Obstetrics                             Anesthesia Physical Anesthesia Plan  ASA: IV  Anesthesia Plan: MAC   Post-op Pain Management:    Induction: Intravenous  PONV Risk Score and Plan: 1 and Propofol infusion and Treatment may vary due to age or medical condition  Airway Management Planned: Nasal Cannula  Additional Equipment:   Intra-op Plan:   Post-operative Plan:   Informed Consent: I have reviewed the patients History and Physical, chart, labs and discussed the procedure including the risks, benefits and alternatives for the proposed anesthesia with the patient or authorized representative who has indicated his/her understanding and acceptance.     Dental advisory given  Plan Discussed with: CRNA and Anesthesiologist  Anesthesia Plan Comments:          Anesthesia Quick Evaluation

## 2019-05-15 NOTE — Progress Notes (Signed)
Pt returned from dialysis during report. When room entered, pt telemetry wires were lying in the bed. When reapplication attempted, pt stated she was "tired of the wires". Pt informed it was her right to decline application, she " I do." Hospitalist informed at 59. Hospitalist called at [phone]- IVF order clarified, confirmation rec'd to DC tele order.

## 2019-05-15 NOTE — Interval H&P Note (Signed)
History and Physical Interval Note:  05/15/2019 1:01 PM  [name]  has presented today for surgery, with the diagnosis of Anemia, GI bleeding.  The various methods of treatment have been discussed with the patient and family. After consideration of risks, benefits and other options for treatment, the patient has consented to  Procedure(s): ESOPHAGOGASTRODUODENOSCOPY (EGD) WITH PROPOFOL (N/A) as a surgical intervention.  The patient's history has been reviewed, patient examined, no change in status, stable for surgery.  I have reviewed the patient's chart and labs.  Questions were answered to the patient's satisfaction.     Jackquline Denmark

## 2019-05-15 NOTE — Progress Notes (Signed)
Family called requesting updates. Call transferred to endo.

## 2019-05-15 NOTE — Anesthesia Procedure Notes (Signed)
Procedure Name: MAC Date/Time: 05/15/2019 1:03 PM Performed by: Alain Marion, CRNA Pre-anesthesia Checklist: Patient identified, Emergency Drugs available, Suction available, Patient being monitored and Timeout performed Oxygen Delivery Method: Nasal cannula Placement Confirmation: positive ETCO2

## 2019-05-15 NOTE — Transfer of Care (Signed)
Immediate Anesthesia Transfer of Care Note  Patient: BRIGITT MCCLISH  Procedure(s) Performed: ESOPHAGOGASTRODUODENOSCOPY (EGD) WITH PROPOFOL (N/A )  Patient Location: Endoscopy Unit  Anesthesia Type:MAC  Level of Consciousness: awake, alert  and oriented  Airway & Oxygen Therapy: Patient Spontanous Breathing and Patient connected to nasal cannula oxygen  Post-op Assessment: Report given to RN and Post -op Vital signs reviewed and stable  Post vital signs: Reviewed and stable  Last Vitals:  Vitals Value Taken Time  BP 152/55 05/15/19 1326  Temp    Pulse 87 05/15/19 1334  Resp 27 05/15/19 1334  SpO2 100 % 05/15/19 1334  Vitals shown include unvalidated device data.  Last Pain:  Vitals:   05/15/19 1326  TempSrc:   PainSc: 0-No pain      Patients Stated Pain Goal: 0 (69/50/72 2575)  Complications: No apparent anesthesia complications

## 2019-05-16 ENCOUNTER — Other Ambulatory Visit: Payer: Self-pay

## 2019-05-16 ENCOUNTER — Encounter (HOSPITAL_COMMUNITY): Payer: Self-pay | Admitting: Gastroenterology

## 2019-05-16 LAB — TYPE AND SCREEN
ABO/RH(D): O POS
Antibody Screen: NEGATIVE
Unit division: 0
Unit division: 0
Unit division: 0
Unit division: 0

## 2019-05-16 LAB — BPAM RBC
Blood Product Expiration Date: 202011072359
Blood Product Expiration Date: 202011072359
Blood Product Expiration Date: 202011102359
Blood Product Expiration Date: 202011102359
ISSUE DATE / TIME: 202010091742
ISSUE DATE / TIME: 202010092051
ISSUE DATE / TIME: 202010100401
ISSUE DATE / TIME: 202010100642
Unit Type and Rh: 5100
Unit Type and Rh: 5100
Unit Type and Rh: 5100
Unit Type and Rh: 5100

## 2019-05-16 LAB — GLUCOSE, CAPILLARY: Glucose-Capillary: 275 mg/dL — ABNORMAL HIGH (ref 70–99)

## 2019-05-16 MED ORDER — PANTOPRAZOLE SODIUM 40 MG PO TBEC
40.0000 mg | DELAYED_RELEASE_TABLET | Freq: Two times a day (BID) | ORAL | Status: DC
  Administered 2019-05-16: 40 mg via ORAL
  Filled 2019-05-16: qty 1

## 2019-05-16 MED ORDER — PERMETHRIN 5 % EX CREA: 1.0000 "application " | TOPICAL_CREAM | Freq: Once | CUTANEOUS | 0 refills | Status: AC

## 2019-05-16 NOTE — Progress Notes (Signed)
Nsg Discharge Note  Admit Date:  05/13/2019 Discharge date: 05/16/2019   [name] to be D/C'd home per MD order.  AVS completed.   Patient/caregiver able to verbalize understanding.  Discharge Medication: Allergies as of 05/16/2019      Reactions   Penicillins Hives, Other (See Comments)   Has patient had a PCN reaction causing immediate rash, facial/tongue/throat swelling, SOB or lightheadedness with hypotension: Yes Has patient had a PCN reaction causing severe rash involving mucus membranes or skin necrosis: No Has patient had a PCN reaction that required hospitalization No Has patient had a PCN reaction occurring within the last 10 years: No If all of the above answers are "NO", then may proceed with Cephalosporin use. Pt tolerates cefepime and ceftriaxone   Gabapentin Itching, Swelling   Lyrica [pregabalin] Swelling, Other (See Comments)   Facial and leg swelling   Saphris [asenapine] Swelling, Other (See Comments)   Facial swelling   Tramadol Swelling, Other (See Comments)   Leg swelling   Adhesive [tape] Itching   SEVERE ITCHING   Latex Itching   SEVERE ITCHING   Other Other (See Comments)   NO BLOOD PRODUCTS; PATIENT IS A JEHOVAH'S WITNESS      Medication List    TAKE these medications   acetaminophen 325 MG tablet Commonly known as: TYLENOL Take 2 tablets (650 mg total) by mouth every 6 (six) hours as needed for fever, headache or moderate pain.   albuterol (2.5 MG/3ML) 0.083% nebulizer solution Commonly known as: PROVENTIL Take 5 mg by nebulization daily as needed for wheezing or shortness of breath.   albuterol 108 (90 Base) MCG/ACT inhaler Commonly known as: VENTOLIN HFA Inhale 2 puffs into the lungs every 4 (four) hours as needed for wheezing or shortness of breath.   allopurinol 100 MG tablet Commonly known as: ZYLOPRIM Take 2 tablets (200 mg total) by mouth daily.   ALPRAZolam 0.5 MG tablet Commonly known as: XANAX Take 0.5 mg by mouth 3 (three)  times daily as needed for anxiety.   amLODipine 2.5 MG tablet Commonly known as: NORVASC Take 2.5 mg by mouth daily.   atorvastatin 40 MG tablet Commonly known as: LIPITOR Take 1 tablet (40 mg total) by mouth at bedtime.   Darbepoetin Alfa 200 MCG/0.4ML Sosy injection Commonly known as: ARANESP Inject 0.4 mLs (200 mcg total) into the vein every Wednesday with hemodialysis.   diphenhydrAMINE 25 mg capsule Commonly known as: BENADRYL Take 1 capsule (25 mg total) by mouth every 6 (six) hours as needed for up to 8 days for itching.   furosemide 80 MG tablet Commonly known as: LASIX Take 80 mg by mouth 2 (two) times daily.   hydrocortisone 2.5 % rectal cream Commonly known as: ANUSOL-HC Place 1 application rectally 2 (two) times daily.   hydrOXYzine 25 MG tablet Commonly known as: ATARAX/VISTARIL Take 1 tablet (25 mg total) by mouth 3 (three) times daily as needed for itching or anxiety.   insulin aspart 100 UNIT/ML injection Commonly known as: novoLOG 0-9 Units, Subcutaneous, 3 times daily with meals CBG < 70: implement hypoglycemia protocol CBG 70 - 120: 0 units CBG 121 - 150: 1 unit CBG 151 - 200: 2 units CBG 201 - 250: 3 units CBG 251 - 300: 5 units CBG 301 - 350: 7 units CBG 351 - 400: 9 units CBG > 400: call MD What changed:   how much to take  how to take this  when to take this   insulin glargine  100 UNIT/ML injection Commonly known as: LANTUS Inject 24 Units into the skin at bedtime.   lidocaine-prilocaine cream Commonly known as: EMLA Apply 1 application topically See admin instructions. On dialysis days   methocarbamol 500 MG tablet Commonly known as: ROBAXIN Take 500 mg by mouth daily as needed for muscle spasms.   mupirocin ointment 2 % Commonly known as: BACTROBAN Apply 1 application topically as needed (on arm).   Oxycodone HCl 10 MG Tabs Take 1 tablet (10 mg total) by mouth 4 (four) times daily.   oxymetazoline 0.05 % nasal spray Commonly known  as: AFRIN Place 2 sprays into both nostrils 2 (two) times daily.   pantoprazole 40 MG tablet Commonly known as: PROTONIX Take 1 tablet (40 mg total) by mouth daily at 6 (six) AM for 30 days.   permethrin 5 % cream Commonly known as: ELIMITE Apply 1 application topically once for 1 dose.   SARNA ULTRA EX Apply 1 application topically daily as needed (itch).   senna-docusate 8.6-50 MG tablet Commonly known as: Senokot-S Take 2 tablets by mouth at bedtime.   traZODone 50 MG tablet Commonly known as: DESYREL Take 0.5 tablets (25 mg total) by mouth at bedtime.       Discharge Assessment: Vitals:   05/16/19 0746 05/16/19 0926  BP: (!) 188/73 (!) 173/72  Pulse: (!) 103   Resp:    Temp: 98.8 F (37.1 C)   SpO2: 99%    Skin clean, dry and intact without evidence of skin break down, no evidence of skin tears noted. IV catheter discontinued intact. Site without signs and symptoms of complications - no redness or edema noted at insertion site, patient denies c/o pain - only slight tenderness at site.  Dressing with slight pressure applied.  D/c Instructions-Education: Discharge instructions given to patient/family with verbalized understanding. D/c education completed with patient/family including follow up instructions, medication list, d/c activities limitations if indicated, with other d/c instructions as indicated by MD - patient able to verbalize understanding, all questions fully answered. Patient instructed to return to ED, call 911, or call MD for any changes in condition.  Patient escorted via [facility], and D/C home via private auto  Atilano Ina, RN 05/16/2019 11:27 AM

## 2019-05-16 NOTE — Progress Notes (Signed)
  [facility] Progress Note   Assessment/ Plan:    HD orders at [facility] MWF 3hr 30 min 160NRe thru  Lt FAL AVG  EDW 74kg 2/2 bath UFR Profile 4 Mircera 225 q2weeks last given 9/30  Calcitriol 2.5 PO TIW  1. ESRD - last HD was 9/30 and only on for 2hr 60min leaving 1.2 L above EDW of 74kg. She has gotten close to her EDW previously.  HD 10/9 and 10/10.  Next planned HD 10/12, can go to OP center, 2. Acute GI bleeding- got mult transfusions, still bleeding, Hgb 5.7--> 7.9 after transfusions.  EGD clear 10/10.  Please note she doesn't want any of her family members knowing she got blood 3. Bone/ mineral- phos binders when eating. 4. HTN/ volume- above EDW- no change to EDW, just needs to get there 5. Scabies- has permethrin 6. Dispo: Ok for d/c from renal perspective  Subjective:    Hgb up to 7.9.  No more stools.  EGD with normal findings.      Objective:   BP (!) 173/72   Pulse (!) 103   Temp 98.8 F (37.1 C) (Oral)   Resp 18   Ht 5\' 4"  (1.626 m)   Wt 75.4 kg   LMP  (LMP Unknown)   SpO2 99%   BMI 28.53 kg/m   Physical Exam: Gen: standing and walking around the halls CVS: tachycardia improved Resp: on RA breathing unlabored Abd: benign Ext: edema and facial fullness improved ACCESS: AVF  Labs: BMET Recent Labs  Lab 05/13/19 1812 05/14/19 0340 05/15/19 0230 05/15/19 1208  NA 128* 132* 135 133*  K 5.0 4.8 4.6 4.7  CL 83* 89* 98 98  CO2 19* 16* 24 19*  GLUCOSE 380* 158* 157* 179*  BUN 155* 158* 51* 53*  CREATININE 13.30* 13.53* 6.09* 6.67*  CALCIUM 8.2* 7.8* 7.5* 7.7*  PHOS  --  10.2*  --   --    CBC Recent Labs  Lab 05/13/19 1812 05/14/19 0340 05/15/19 0230 05/15/19 1208  WBC 3.7* 3.9*  --  5.7  NEUTROABS 2.9  --   --  4.5  HGB 5.7* 4.7* 5.7* 7.9*  HCT 18.0* 15.2* 17.6* 22.8*  MCV 101.1* 103.4*  --  91.2  PLT 114* 102*  --  108*    @IMGRELPRIORS @ Medications:    . Chlorhexidine Gluconate Cloth  6 each Topical Q0600  . insulin  aspart  0-5 Units Subcutaneous QHS  . insulin aspart  0-9 Units Subcutaneous TID WC  . insulin glargine  10 Units Subcutaneous Daily  . pantoprazole  40 mg Oral BID     Madelon Lips, MD 05/16/2019, 10:36 AM

## 2019-05-16 NOTE — Progress Notes (Signed)
Pt called staff to room, stated she wanted her IV removed because it was hurting her arm. Pt had 2 IV sites when care assumed, pt had inadvertently self DC'ed 1st site earlier in shift. Education provided regarding need for IV access in the hospital setting, but pt insisted that IV be removed and declined to allow any attempt at additional access.

## 2019-05-16 NOTE — Discharge Summary (Signed)
Physician Discharge Summary  [name] VWP:[id number] DOB: [birth year] DOA: 05/13/2019  PCP: Rocco Serene, MD  Admit date: 05/13/2019 Discharge date: 05/16/2019  Admitted From: Home Disposition:  Home  Recommendations for Outpatient Follow-up:  1. Follow up with PCP in 1-2 weeks 2. Follow up with HD as scheduled  Discharge Condition:Improved CODE STATUS:Full Diet recommendation: Diabetic, renal   Brief/Interim Summary: 53 y.o.femalewithhistory of ESRD on hemodialysis, hypertension, diabetes mellitus, recurrent GI bleed admitted in August 2020 with GI bleed presents to the ER because of shortness of breath and had missed her dialysis for last 1 week. Patient states that over the last 5 days patient also has been nausea vomiting and diarrhea. And left flank pain. Denies any fever chills productive cough has had some chest discomfort on throwing up.  ED Course:In the ER patient was noticed to have hemoglobin of 5.7 which is a drop of 3 g from August at the time it was 8.3. Patient is 114. This is unremarkable. Metabolic panel shows blood glucose of 380 bicarb 19 anion gap 26. Stool for occult blood was positive and was melanotic. COVID-19 test is negative. On-call nephrologist was consulted. Given the GI bleed on-call gastroenterology Dr. Havery Moros was also notified advised to start patient on Protonix infusion. Patient is Jehovah's Witness and at this time is refused blood transfusion but during previous admission has agreed for transfusion. Given the DKA picture patient was started on insulin infusion without any IV fluid bolus. On exam patient has mild tenderness of the left lower quadrant for which CT abdomen and lactic acid has been ordered.   Discharge Diagnoses:  Principal Problem:   Acute GI bleeding Active Problems:   Seizure disorder (HCC)   ESRD (end stage renal disease) (HCC)   Prolonged QT interval   Acute blood loss anemia   DKA (diabetic ketoacidoses)  ([facility])   1. Acute GI bleeding given the melanotic stool patient is suspected to have upper GI bleed.  1. Patient has had extensive GI study this year with EGD colonoscopy and enteroscopy capsule study.  2. Pt was continued on protonix gtt, changed to PO by GI 3. On 10/9, pt noted to have large melanotic stools. Pt is Jehova's witness and thus had been refusing blood transfusion 4. Pt later consented to blood transfusion, and has since received total 4 units of PRBC's 5. Pt now s/p repeat EGD with normal findings 6. Cleared for DC by GI. Post-transfusion hgb stable 2. Acute respiratory failure with hypoxia secondary to patient missing dialysis. 1. Nephrology following while in hospital 2. Pt to continue HD as tolerated 3. Diabetic ketoacidosis 1. Pt was initially continued on IV insulin infusion 2. GAP has improved and glucose trends improved 3. Successfully transitioned to subq insulin 4. Hypertension 1. BP remains stable thus far 5. Prolonged QT interval  1. Cont to avoid QT prolonging medications. 6. Acute blood loss anemia 1. Pt had agreed to blood transfusion on 10/9 2. Thus far, pt has received total 4 units PRBC's 3. Hemodynamically stable with appropriate correction in hgb 7. Possible scabies 1. Pt reports possible scabies exposure with diffuse pruritic rash across body, most notably across torso, shoulders, scalp. Also itchy between fingers, however no discernable rash 2. Pt is continued on empiric permethrin cream 3. Have added benadryl cream for symptom relief   Discharge Instructions   Allergies as of 05/16/2019      Reactions   Penicillins Hives, Other (See Comments)   Has patient had a PCN reaction  causing immediate rash, facial/tongue/throat swelling, SOB or lightheadedness with hypotension: Yes Has patient had a PCN reaction causing severe rash involving mucus membranes or skin necrosis: No Has patient had a PCN reaction that required hospitalization No Has  patient had a PCN reaction occurring within the last 10 years: No If all of the above answers are "NO", then may proceed with Cephalosporin use. Pt tolerates cefepime and ceftriaxone   Gabapentin Itching, Swelling   Lyrica [pregabalin] Swelling, Other (See Comments)   Facial and leg swelling   Saphris [asenapine] Swelling, Other (See Comments)   Facial swelling   Tramadol Swelling, Other (See Comments)   Leg swelling   Adhesive [tape] Itching   SEVERE ITCHING   Latex Itching   SEVERE ITCHING   Other Other (See Comments)   NO BLOOD PRODUCTS; PATIENT IS A JEHOVAH'S WITNESS      Medication List    TAKE these medications   acetaminophen 325 MG tablet Commonly known as: TYLENOL Take 2 tablets (650 mg total) by mouth every 6 (six) hours as needed for fever, headache or moderate pain.   albuterol (2.5 MG/3ML) 0.083% nebulizer solution Commonly known as: PROVENTIL Take 5 mg by nebulization daily as needed for wheezing or shortness of breath.   albuterol 108 (90 Base) MCG/ACT inhaler Commonly known as: VENTOLIN HFA Inhale 2 puffs into the lungs every 4 (four) hours as needed for wheezing or shortness of breath.   allopurinol 100 MG tablet Commonly known as: ZYLOPRIM Take 2 tablets (200 mg total) by mouth daily.   ALPRAZolam 0.5 MG tablet Commonly known as: XANAX Take 0.5 mg by mouth 3 (three) times daily as needed for anxiety.   amLODipine 2.5 MG tablet Commonly known as: NORVASC Take 2.5 mg by mouth daily.   atorvastatin 40 MG tablet Commonly known as: LIPITOR Take 1 tablet (40 mg total) by mouth at bedtime.   Darbepoetin Alfa 200 MCG/0.4ML Sosy injection Commonly known as: ARANESP Inject 0.4 mLs (200 mcg total) into the vein every Wednesday with hemodialysis.   diphenhydrAMINE 25 mg capsule Commonly known as: BENADRYL Take 1 capsule (25 mg total) by mouth every 6 (six) hours as needed for up to 8 days for itching.   furosemide 80 MG tablet Commonly known as:  LASIX Take 80 mg by mouth 2 (two) times daily.   hydrocortisone 2.5 % rectal cream Commonly known as: ANUSOL-HC Place 1 application rectally 2 (two) times daily.   hydrOXYzine 25 MG tablet Commonly known as: ATARAX/VISTARIL Take 1 tablet (25 mg total) by mouth 3 (three) times daily as needed for itching or anxiety.   insulin aspart 100 UNIT/ML injection Commonly known as: novoLOG 0-9 Units, Subcutaneous, 3 times daily with meals CBG < 70: implement hypoglycemia protocol CBG 70 - 120: 0 units CBG 121 - 150: 1 unit CBG 151 - 200: 2 units CBG 201 - 250: 3 units CBG 251 - 300: 5 units CBG 301 - 350: 7 units CBG 351 - 400: 9 units CBG > 400: call MD What changed:   how much to take  how to take this  when to take this   insulin glargine 100 UNIT/ML injection Commonly known as: LANTUS Inject 24 Units into the skin at bedtime.   lidocaine-prilocaine cream Commonly known as: EMLA Apply 1 application topically See admin instructions. On dialysis days   methocarbamol 500 MG tablet Commonly known as: ROBAXIN Take 500 mg by mouth daily as needed for muscle spasms.   mupirocin ointment 2 %  Commonly known as: BACTROBAN Apply 1 application topically as needed (on arm).   Oxycodone HCl 10 MG Tabs Take 1 tablet (10 mg total) by mouth 4 (four) times daily.   oxymetazoline 0.05 % nasal spray Commonly known as: AFRIN Place 2 sprays into both nostrils 2 (two) times daily.   pantoprazole 40 MG tablet Commonly known as: PROTONIX Take 1 tablet (40 mg total) by mouth daily at 6 (six) AM for 30 days.   SARNA ULTRA EX Apply 1 application topically daily as needed (itch).   senna-docusate 8.6-50 MG tablet Commonly known as: Senokot-S Take 2 tablets by mouth at bedtime.   traZODone 50 MG tablet Commonly known as: DESYREL Take 0.5 tablets (25 mg total) by mouth at bedtime.       Allergies  Allergen Reactions  . Penicillins Hives and Other (See Comments)    Has patient had a PCN  reaction causing immediate rash, facial/tongue/throat swelling, SOB or lightheadedness with hypotension: Yes Has patient had a PCN reaction causing severe rash involving mucus membranes or skin necrosis: No Has patient had a PCN reaction that required hospitalization No Has patient had a PCN reaction occurring within the last 10 years: No If all of the above answers are "NO", then may proceed with Cephalosporin use. Pt tolerates cefepime and ceftriaxone  . Gabapentin Itching and Swelling  . Lyrica [Pregabalin] Swelling and Other (See Comments)    Facial and leg swelling  . Saphris [Asenapine] Swelling and Other (See Comments)    Facial swelling  . Tramadol Swelling and Other (See Comments)    Leg swelling  . Adhesive [Tape] Itching    SEVERE ITCHING  . Latex Itching    SEVERE ITCHING  . Other Other (See Comments)    NO BLOOD PRODUCTS; PATIENT IS A JEHOVAH'S WITNESS    Consultations:  GI  Nephrology  Procedures/Studies: Dg Abd Acute W/chest  Result Date: 05/13/2019 CLINICAL DATA:  Chest pain, shortness of breath, nausea, and vomiting. EXAM: DG ABDOMEN ACUTE W/ 1V CHEST COMPARISON:  CT abdomen pelvis dated April 02, 2019. Chest x-ray dated April 01, 2019. FINDINGS: Unchanged moderate cardiomegaly. Normal pulmonary vascularity. No focal consolidation, pleural effusion, or pneumothorax. There is no evidence of dilated bowel loops or free intraperitoneal air. No radiopaque calculi or other significant radiographic abnormality is seen. No acute osseous abnormality. IMPRESSION: 1. No acute findings.  No active cardiopulmonary disease. 2. Unchanged moderate cardiomegaly. Electronically Signed   By: Titus Dubin M.D.   On: 05/13/2019 18:30   Ct Renal Stone Study  Result Date: 05/14/2019 CLINICAL DATA:  Left lower abdominal/flank pain, nausea, vomiting, diarrhea. End-stage renal disease on hemodialysis. Anemia. EXAM: CT ABDOMEN AND PELVIS WITHOUT CONTRAST TECHNIQUE: Multidetector CT  imaging of the abdomen and pelvis was performed following the standard protocol without IV contrast. COMPARISON:  01/22/2019 CT abdomen/pelvis. FINDINGS: Lower chest: Small dependent left pleural effusion. Cardiomegaly. Low cardiac blood pool density. Coronary atherosclerosis. Hepatobiliary: Diffusely finely irregular liver surface compatible with hepatic cirrhosis. No liver masses. Cholelithiasis. Contracted gallbladder. No gallbladder wall thickening. No biliary ductal dilatation. Pancreas: Diffuse mild peripancreatic fat stranding. No pancreatic duct dilation or discrete mass. Spleen: Mild splenomegaly (craniocaudal splenic length 13.0 cm), slightly increased. No splenic mass. Adrenals/Urinary Tract: Normal adrenals. A 2 mm calcification in the interpolar right kidney could represent vascular calcification or nonobstructing stone. No hydronephrosis. No contour deforming renal masses. Collapsed and normal bladder. Stomach/Bowel: Normal non-distended stomach. Normal caliber small bowel with no small bowel wall thickening. Normal appendix. Oral  contrast transits to the distal large bowel. No definite large bowel wall thickening or significant diverticulosis. Vascular/Lymphatic: Atherosclerotic nonaneurysmal abdominal aorta. Mild bilateral external iliac lymphadenopathy measuring up to 1.1 cm on the left (series 3/image 75), new since 01/22/2027. No pathologically enlarged abdominal nodes. Reproductive: Status post hysterectomy, with no abnormal findings at the vaginal cuff. No adnexal mass. Other: No pneumoperitoneum. Trace pelvic ascites. No focal fluid collection. Mild anasarca. Musculoskeletal: No aggressive appearing focal osseous lesions. Faint sclerosis throughout the skeleton compatible with renal osteodystrophy. IMPRESSION: 1. Diffuse mild peripancreatic fat stranding, cannot exclude acute pancreatitis. 2. Cholelithiasis. No evidence of acute cholecystitis. No biliary or pancreatic duct dilation. 3.  Cirrhosis.  Mild splenomegaly. 4. Small dependent left pleural effusion. Trace pelvic ascites. Mild anasarca. 5. No evidence of bowel obstruction or acute bowel inflammation. 6. Nonspecific mild bilateral external iliac lymphadenopathy, new since 01/22/2019 CT. Suggest attention on follow-up CT abdomen/pelvis with oral and IV contrast in 3 months. This recommendation follows ACR consensus guidelines: White Paper of the ACR Incidental Findings Committee II on Splenic and Nodal Findings. J Am Coll Radiol 4650;35:465-681. 7. Low cardiac blood pool density, compatible with anemia. 8.  Aortic Atherosclerosis (ICD10-I70.0). Electronically Signed   By: Ilona Sorrel M.D.   On: 05/14/2019 08:38     Subjective: Eager to go home this AM  Discharge Exam: Vitals:   05/16/19 0746 05/16/19 0926  BP: (!) 188/73 (!) 173/72  Pulse: (!) 103   Resp:    Temp: 98.8 F (37.1 C)   SpO2: 99%    Vitals:   05/16/19 0058 05/16/19 0428 05/16/19 0746 05/16/19 0926  BP: (!) 141/60 (!) 173/73 (!) 188/73 (!) 173/72  Pulse: 76 (!) 110 (!) 103   Resp: 18     Temp: 98.7 F (37.1 C) 99.3 F (37.4 C) 98.8 F (37.1 C)   TempSrc: Oral Oral Oral   SpO2:  99% 99%   Weight:      Height:        General: Pt is alert, awake, not in acute distress Cardiovascular: RRR, S1/S2 +, no rubs, no gallops Respiratory: CTA bilaterally, no wheezing, no rhonchi Abdominal: Soft, NT, ND, bowel sounds + Extremities: no edema, no cyanosis   The results of significant diagnostics from this hospitalization (including imaging, microbiology, ancillary and laboratory) are listed below for reference.     Microbiology: Recent Results (from the past 240 hour(s))  SARS Coronavirus 2 by RT PCR (hospital order, performed in [facility] hospital lab) Nasopharyngeal Nasopharyngeal Swab     Status: None   Collection Time: 05/13/19 10:14 PM   Specimen: Nasopharyngeal Swab  Result Value Ref Range Status   SARS Coronavirus 2 NEGATIVE NEGATIVE  Final    Comment: (NOTE) If result is NEGATIVE SARS-CoV-2 target nucleic acids are NOT DETECTED. The SARS-CoV-2 RNA is generally detectable in upper and lower  respiratory specimens during the acute phase of infection. The lowest  concentration of SARS-CoV-2 viral copies this assay can detect is 250  copies / mL. A negative result does not preclude SARS-CoV-2 infection  and should not be used as the sole basis for treatment or other  patient management decisions.  A negative result may occur with  improper specimen collection / handling, submission of specimen other  than nasopharyngeal swab, presence of viral mutation(s) within the  areas targeted by this assay, and inadequate number of viral copies  (<250 copies / mL). A negative result must be combined with clinical  observations, patient history, and epidemiological  information. If result is POSITIVE SARS-CoV-2 target nucleic acids are DETECTED. The SARS-CoV-2 RNA is generally detectable in upper and lower  respiratory specimens dur ing the acute phase of infection.  Positive  results are indicative of active infection with SARS-CoV-2.  Clinical  correlation with patient history and other diagnostic information is  necessary to determine patient infection status.  Positive results do  not rule out bacterial infection or co-infection with other viruses. If result is PRESUMPTIVE POSTIVE SARS-CoV-2 nucleic acids MAY BE PRESENT.   A presumptive positive result was obtained on the submitted specimen  and confirmed on repeat testing.  While 2019 novel coronavirus  (SARS-CoV-2) nucleic acids may be present in the submitted sample  additional confirmatory testing may be necessary for epidemiological  and / or clinical management purposes  to differentiate between  SARS-CoV-2 and other Sarbecovirus currently known to infect humans.  If clinically indicated additional testing with an alternate test  methodology [phone]) is advised. The  SARS-CoV-2 RNA is generally  detectable in upper and lower respiratory sp ecimens during the acute  phase of infection. The expected result is Negative. Fact Sheet for Patients:  StrictlyIdeas.no Fact Sheet for Healthcare Providers: BankingDealers.co.za This test is not yet approved or cleared by the [place] FDA and has been authorized for detection and/or diagnosis of SARS-CoV-2 by FDA under an Emergency Use Authorization (EUA).  This EUA will remain in effect (meaning this test can be used) for the duration of the COVID-19 declaration under Section 564(b)(1) of the Act, 21 U.S.C. section 360bbb-3(b)(1), unless the authorization is terminated or revoked sooner. Performed at [facility], [place] [address], [place], [place] [address]   MRSA PCR Screening     Status: Abnormal   Collection Time: 05/15/19  8:49 AM   Specimen: Nasopharyngeal  Result Value Ref Range Status   MRSA by PCR POSITIVE (A) NEGATIVE Final    Comment:        The GeneXpert MRSA Assay (FDA approved for NASAL specimens only), is one component of a comprehensive MRSA colonization surveillance program. It is not intended to diagnose MRSA infection nor to guide or monitor treatment for MRSA infections. RESULT CALLED TO, READ BACK BY AND VERIFIED WITH: RN CRYSTAL M. [id number] FCP Performed at [facility], [place] [address], [place], [place] [address]      Labs: BNP (last 3 results) No results for input(s): BNP in the last 8760 hours. Basic Metabolic Panel: Recent Labs  Lab 05/13/19 1812 05/14/19 0340 05/15/19 0230 05/15/19 1208  NA 128* 132* 135 133*  K 5.0 4.8 4.6 4.7  CL 83* 89* 98 98  CO2 19* 16* 24 19*  GLUCOSE 380* 158* 157* 179*  BUN 155* 158* 51* 53*  CREATININE 13.30* 13.53* 6.09* 6.67*  CALCIUM 8.2* 7.8* 7.5* 7.7*  PHOS  --  10.2*  --   --    Liver Function Tests: Recent Labs  Lab 05/13/19 1812 05/14/19 0340  AST 43*  --    ALT 63*  --   ALKPHOS 93  --   BILITOT 1.2  --   PROT 6.9  --   ALBUMIN 3.4* 2.9*   Recent Labs  Lab 05/13/19 1812  LIPASE 66*   No results for input(s): AMMONIA in the last 168 hours. CBC: Recent Labs  Lab 05/13/19 1812 05/14/19 0340 05/15/19 0230 05/15/19 1208  WBC 3.7* 3.9*  --  5.7  NEUTROABS 2.9  --   --  4.5  HGB 5.7*  4.7* 5.7* 7.9*  HCT 18.0* 15.2* 17.6* 22.8*  MCV 101.1* 103.4*  --  91.2  PLT 114* 102*  --  108*   Cardiac Enzymes: No results for input(s): CKTOTAL, CKMB, CKMBINDEX, TROPONINI in the last 168 hours. BNP: Invalid input(s): POCBNP CBG: Recent Labs  Lab 05/15/19 1156 05/15/19 1730 05/15/19 1930 05/15/19 2207 05/16/19 0744  GLUCAP 144* 193* 193* 250* 275*   D-Dimer No results for input(s): DDIMER in the last 72 hours. Hgb A1c No results for input(s): HGBA1C in the last 72 hours. Lipid Profile No results for input(s): CHOL, HDL, LDLCALC, TRIG, CHOLHDL, LDLDIRECT in the last 72 hours. Thyroid function studies No results for input(s): TSH, T4TOTAL, T3FREE, THYROIDAB in the last 72 hours.  Invalid input(s): FREET3 Anemia work up No results for input(s): VITAMINB12, FOLATE, FERRITIN, TIBC, IRON, RETICCTPCT in the last 72 hours. Urinalysis    Component Value Date/Time   COLORURINE YELLOW 02/25/2017 1224   APPEARANCEUR HAZY (A) 02/25/2017 1224   LABSPEC 1.013 02/25/2017 1224   PHURINE 8.0 02/25/2017 1224   GLUCOSEU >=500 (A) 02/25/2017 1224   HGBUR SMALL (A) 02/25/2017 1224   BILIRUBINUR NEGATIVE 02/25/2017 1224   BILIRUBINUR neg 12/13/2014 1127   KETONESUR NEGATIVE 02/25/2017 1224   PROTEINUR >=300 (A) 02/25/2017 1224   UROBILINOGEN 0.2 05/01/2015 1252   NITRITE NEGATIVE 02/25/2017 1224   LEUKOCYTESUR NEGATIVE 02/25/2017 1224   Sepsis Labs Invalid input(s): PROCALCITONIN,  WBC,  LACTICIDVEN Microbiology Recent Results (from the past 240 hour(s))  SARS Coronavirus 2 by RT PCR (hospital order, performed in [facility] hospital lab)  Nasopharyngeal Nasopharyngeal Swab     Status: None   Collection Time: 05/13/19 10:14 PM   Specimen: Nasopharyngeal Swab  Result Value Ref Range Status   SARS Coronavirus 2 NEGATIVE NEGATIVE Final    Comment: (NOTE) If result is NEGATIVE SARS-CoV-2 target nucleic acids are NOT DETECTED. The SARS-CoV-2 RNA is generally detectable in upper and lower  respiratory specimens during the acute phase of infection. The lowest  concentration of SARS-CoV-2 viral copies this assay can detect is 250  copies / mL. A negative result does not preclude SARS-CoV-2 infection  and should not be used as the sole basis for treatment or other  patient management decisions.  A negative result may occur with  improper specimen collection / handling, submission of specimen other  than nasopharyngeal swab, presence of viral mutation(s) within the  areas targeted by this assay, and inadequate number of viral copies  (<250 copies / mL). A negative result must be combined with clinical  observations, patient history, and epidemiological information. If result is POSITIVE SARS-CoV-2 target nucleic acids are DETECTED. The SARS-CoV-2 RNA is generally detectable in upper and lower  respiratory specimens dur ing the acute phase of infection.  Positive  results are indicative of active infection with SARS-CoV-2.  Clinical  correlation with patient history and other diagnostic information is  necessary to determine patient infection status.  Positive results do  not rule out bacterial infection or co-infection with other viruses. If result is PRESUMPTIVE POSTIVE SARS-CoV-2 nucleic acids MAY BE PRESENT.   A presumptive positive result was obtained on the submitted specimen  and confirmed on repeat testing.  While 2019 novel coronavirus  (SARS-CoV-2) nucleic acids may be present in the submitted sample  additional confirmatory testing may be necessary for epidemiological  and / or clinical management purposes  to  differentiate between  SARS-CoV-2 and other Sarbecovirus currently known to infect humans.  If clinically indicated  additional testing with an alternate test  methodology [phone]) is advised. The SARS-CoV-2 RNA is generally  detectable in upper and lower respiratory sp ecimens during the acute  phase of infection. The expected result is Negative. Fact Sheet for Patients:  StrictlyIdeas.no Fact Sheet for Healthcare Providers: BankingDealers.co.za This test is not yet approved or cleared by the [place] FDA and has been authorized for detection and/or diagnosis of SARS-CoV-2 by FDA under an Emergency Use Authorization (EUA).  This EUA will remain in effect (meaning this test can be used) for the duration of the COVID-19 declaration under Section 564(b)(1) of the Act, 21 U.S.C. section 360bbb-3(b)(1), unless the authorization is terminated or revoked sooner. Performed at [facility], [place] [address] [place], [place] [address]   MRSA PCR Screening     Status: Abnormal   Collection Time: 05/15/19  8:49 AM   Specimen: Nasopharyngeal  Result Value Ref Range Status   MRSA by PCR POSITIVE (A) NEGATIVE Final    Comment:        The GeneXpert MRSA Assay (FDA approved for NASAL specimens only), is one component of a comprehensive MRSA colonization surveillance program. It is not intended to diagnose MRSA infection nor to guide or monitor treatment for MRSA infections. RESULT CALLED TO, READ BACK BY AND VERIFIED WITH: RN CRYSTAL M. [id number] FCP Performed at [facility], [place] [address], [place], [place] [address]    Time spent: 30 min  SIGNED:   Marylu Lund, MD  [facility] 05/16/2019, 10:25 AM  If 7PM-7AM, please contact night-coverage

## 2019-05-17 LAB — GLUCOSE, CAPILLARY
Glucose-Capillary: 168 mg/dL — ABNORMAL HIGH (ref 70–99)
Glucose-Capillary: 151 mg/dL — ABNORMAL HIGH (ref 70–99)
Glucose-Capillary: 130 mg/dL — ABNORMAL HIGH (ref 70–99)
Glucose-Capillary: 169 mg/dL — ABNORMAL HIGH (ref 70–99)

## 2019-05-18 ENCOUNTER — Telehealth: Payer: Self-pay

## 2019-05-18 ENCOUNTER — Encounter: Payer: Medicare Other | Admitting: Infectious Diseases

## 2019-05-18 ENCOUNTER — Ambulatory Visit: Payer: Medicare Other | Admitting: Orthopaedic Surgery

## 2019-05-18 NOTE — Telephone Encounter (Signed)
COVID-19 Pre-Screening Questions:  Do you currently have a fever (>100 F), chills or unexplained body aches? NO   Are you currently experiencing new cough, shortness of breath, sore throat, runny nose?NO .  Have you recently travelled outside the state of [place] in the last 14 days? NO .  Have you been in contact with someone that is currently pending confirmation of Covid19 testing or has been confirmed to have the Covid19 virus?  NO  **If the patient answers NO to ALL questions -  advise the patient to please call the clinic before coming to the office should any symptoms develop.     

## 2019-05-19 ENCOUNTER — Ambulatory Visit: Payer: Medicare Other | Admitting: Infectious Diseases

## 2019-05-27 ENCOUNTER — Telehealth: Payer: Self-pay | Admitting: Pharmacy Technician

## 2019-05-27 ENCOUNTER — Ambulatory Visit (INDEPENDENT_AMBULATORY_CARE_PROVIDER_SITE_OTHER): Payer: Medicare Other | Admitting: Infectious Diseases

## 2019-05-27 ENCOUNTER — Encounter: Payer: Self-pay | Admitting: Infectious Diseases

## 2019-05-27 ENCOUNTER — Other Ambulatory Visit: Payer: Self-pay

## 2019-05-27 VITALS — BP 138/66 | HR 80 | Temp 98.1°F | Wt 165.4 lb

## 2019-05-27 DIAGNOSIS — Z9115 Patient's noncompliance with renal dialysis: Secondary | ICD-10-CM | POA: Diagnosis not present

## 2019-05-27 DIAGNOSIS — B182 Chronic viral hepatitis C: Secondary | ICD-10-CM | POA: Diagnosis present

## 2019-05-27 DIAGNOSIS — Z789 Other specified health status: Secondary | ICD-10-CM | POA: Diagnosis not present

## 2019-05-27 DIAGNOSIS — Z992 Dependence on renal dialysis: Secondary | ICD-10-CM | POA: Diagnosis not present

## 2019-05-27 DIAGNOSIS — Z23 Encounter for immunization: Secondary | ICD-10-CM

## 2019-05-27 DIAGNOSIS — IMO0001 Reserved for inherently not codable concepts without codable children: Secondary | ICD-10-CM

## 2019-05-27 DIAGNOSIS — N186 End stage renal disease: Secondary | ICD-10-CM

## 2019-05-27 IMAGING — US US ABSCESS DRAINAGE W/ CATHETER
1 series · 9 of 9 positions shown · non-contrast
Comparison: none

INDICATION: 50-year-old female presents with left thigh myositis and possible
abscess

[Series 1: us abscess drainage w/ catheter · 0.13mm/px · 9 of 9 slices shown]
[im 1/9]
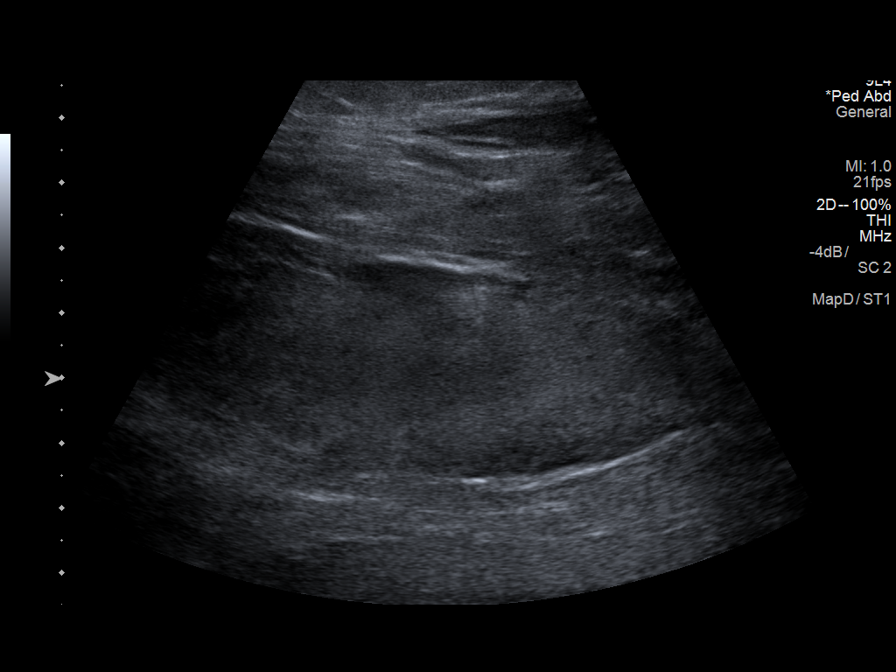
[im 2/9]
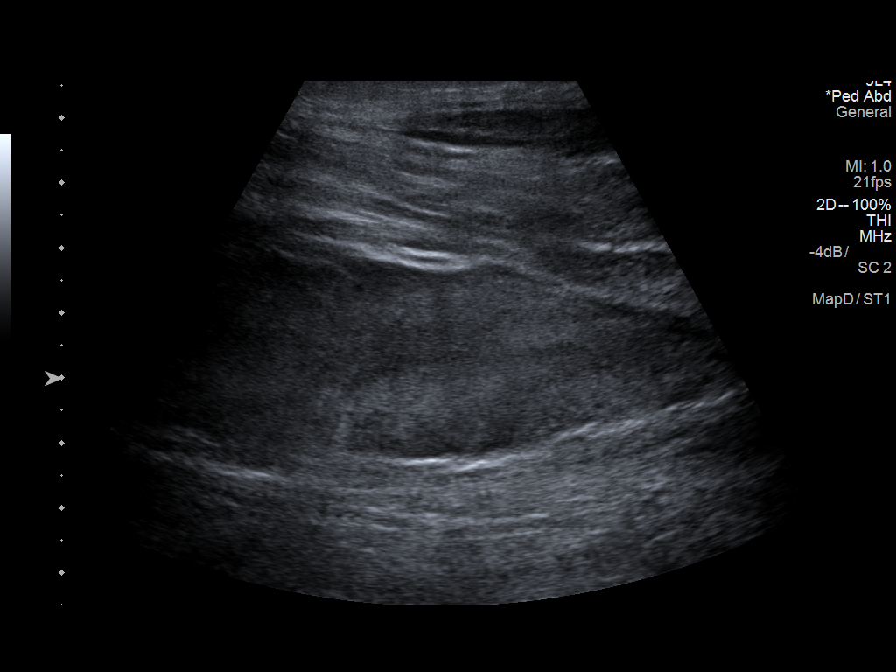
[im 3/9]
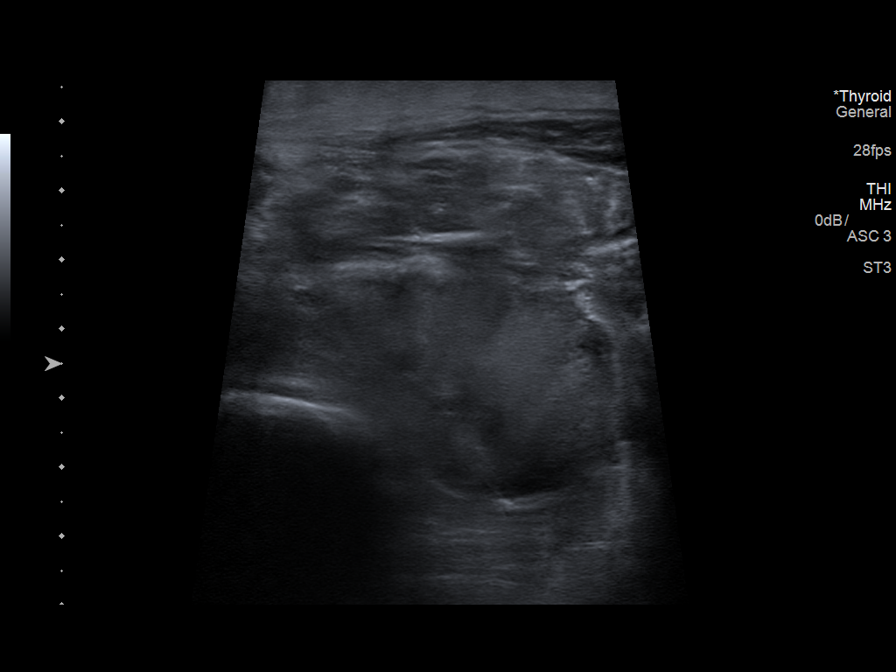
[im 4/9]
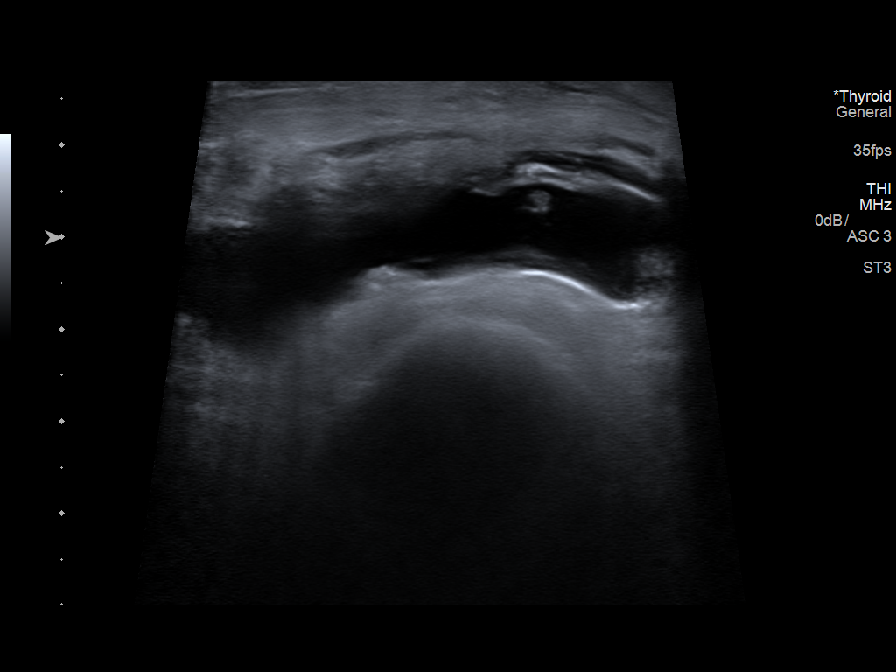
[im 5/9]
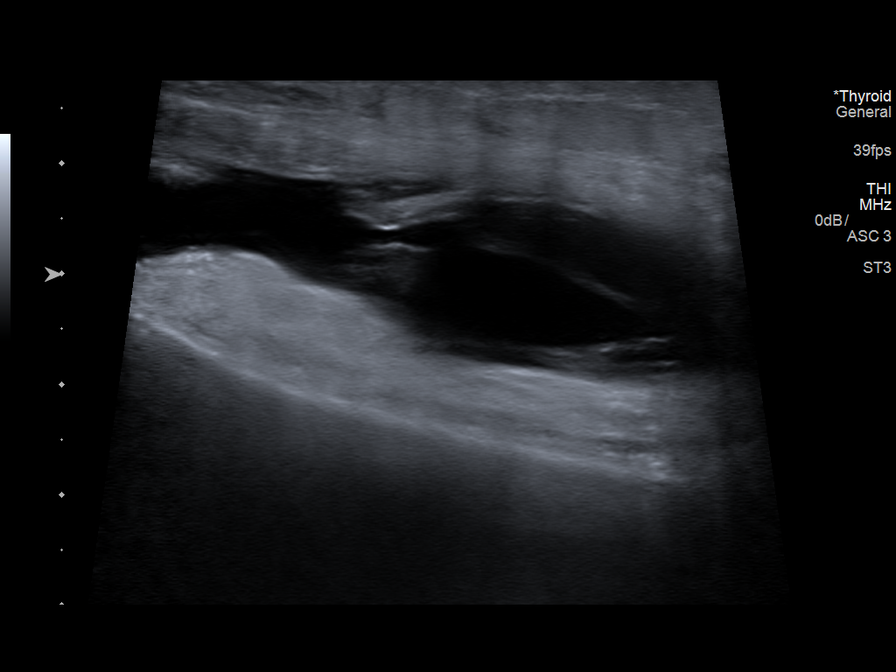
[im 6/9]
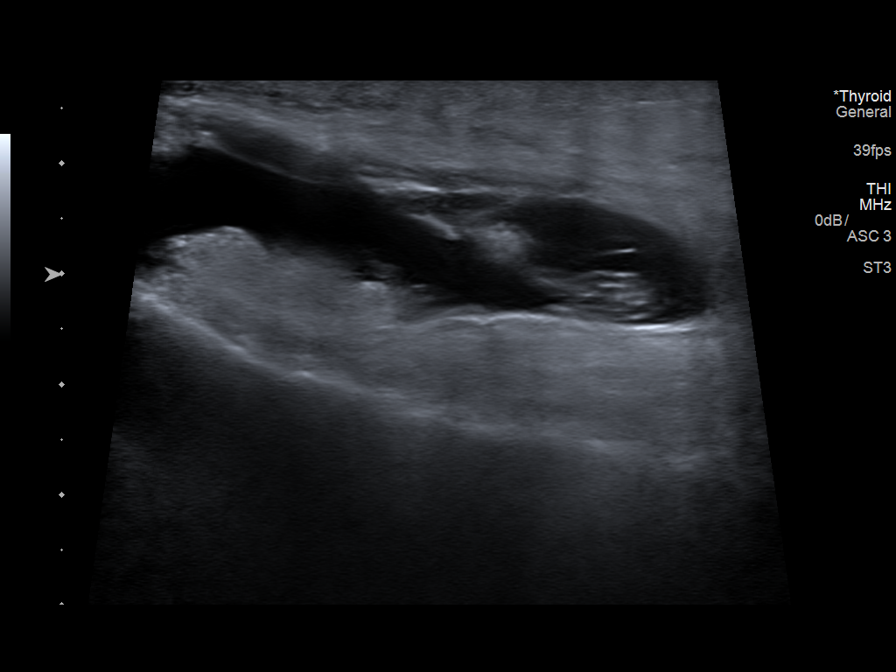
[im 7/9]
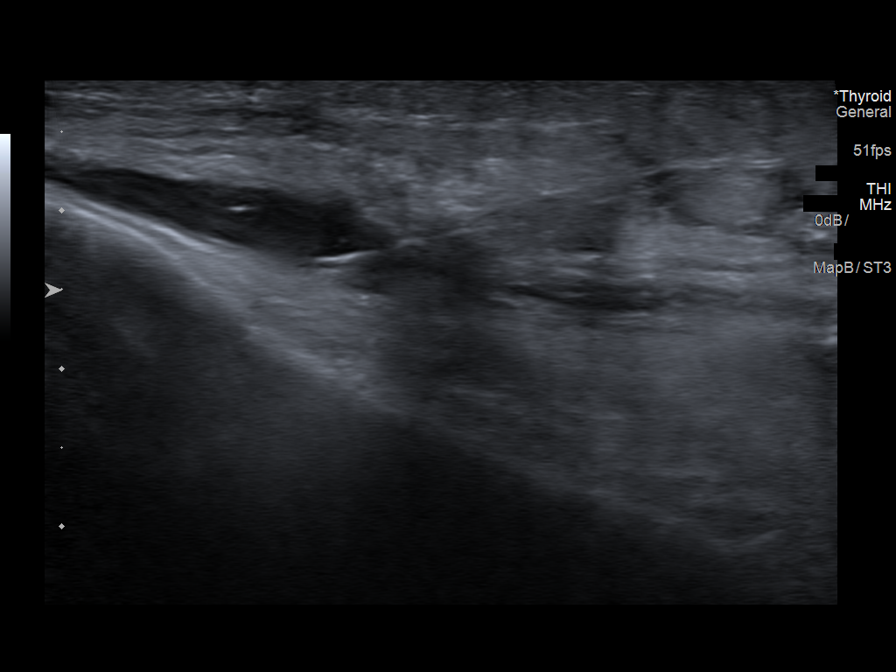
[im 8/9]
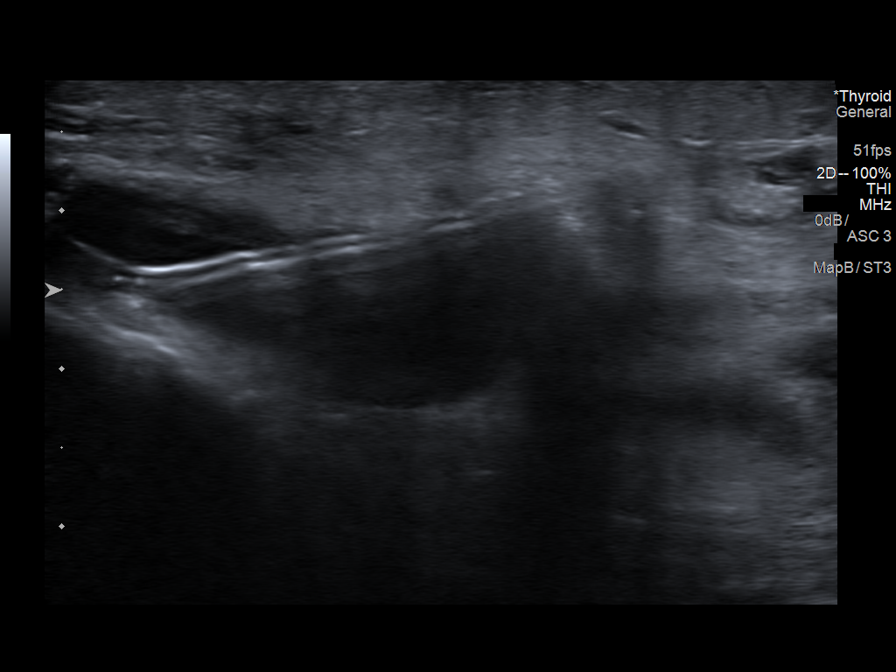
[im 9/9]
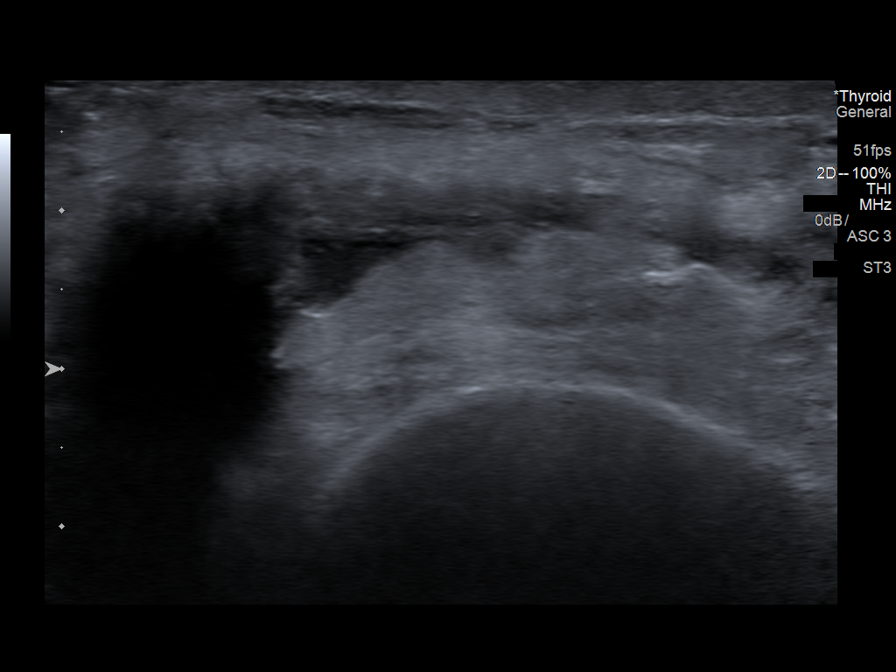

[9 of 9 positions shown; findings below may reference images not displayed]

EXAM:
ULTRASOUND GUIDED ABSCESS DRAINAGE

MEDICATIONS:
The patient is currently admitted to the hospital and receiving
intravenous antibiotics. The antibiotics were administered within an
appropriate time frame prior to the initiation of the procedure.

ANESTHESIA/SEDATION:
None

COMPLICATIONS:
None

PROCEDURE:
Informed written consent was obtained from the patient after a
thorough discussion of the procedural risks, benefits and
alternatives. All questions were addressed. Maximal Sterile Barrier
Technique was utilized including caps, mask, sterile gowns, sterile
gloves, sterile drape, hand hygiene and skin antiseptic. A timeout
was performed prior to the initiation of the procedure.

Patient positioned supine position on the ultrasound stretcher.
Ultrasound images of the left thigh performed with images stored and
sent to PACs.

The patient was then prepped and draped in the usual sterile
fashion. The skin and subcutaneous tissue were generously
infiltrated 1% lidocaine for local anesthesia. Small stab incision
was made with 11 blade scalpel. Using ultrasound guidance, a 10
French drain was advanced into anechoic fluid collection lateral to
the knee in the distal thigh. Drain placed with trocar technique, an
once the drain was in place approximately 30 cc of complex yellow
fluid was aspirated. Catheter was removed at the completion. Sample
was sent for culture.

Ultrasound guidance was then used to in attempt to place a yueh
needle/catheter in a more complex structure of the distal thigh. No
fluid returned.

Sterile dressings were placed.

Patient tolerated the procedure well and remained hemodynamically
stable throughout.

No complications encountered and no significant blood loss.
IMPRESSION: Status post ultrasound-guided aspiration of anechoic fluid at the
distal thigh with return of approximately 30 cc of complex yellow
fluid.

Attempted aspiration of more complex fluid of the distal thigh
unsuccessful, potentially remains as phlegmon. If the fluid becomes
more liquified re- drainage may be considered, or alternatively,
incision and drainage.

## 2019-05-27 NOTE — Telephone Encounter (Addendum)
[facility] Patient Advocate Encounter    Findings of the benefits investigation:   Insurance: [employer]- active medicare  Test run patient indicates patient is able to fill at [employer]  Prior Authorization: will complete once drug determination is made. Waiting at this time whether or not to proceed with treatment, will determine later with labs and patient's continued dialysis treatments.   Bartholomew Crews, CPhT Specialty Pharmacy Patient [facility] for Infectious Disease Phone: [phone] Fax: [phone] 05/27/2019 2:15 PM

## 2019-05-27 NOTE — Progress Notes (Signed)
[name]  [id number]  04-Sep-1965    HPI: The patient is a 53 y.o. y/o AA female who presents today for an evaluation for HCV.  Per her report, she first states that she was told she had hepatitis C in 2016.  The following year, she began hemodialysis for her end-stage renal disease.  She currently receives dialysis at the [facility] clinic in [place] on Mondays, Wednesdays, and Fridays via a left arm AV fistula.  Unfortunately, she is poorly compliant with dialysis and misses her sessions at least once per week.  She also has had significant problems with recent anemia from a presumed GI bleed.  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  She adamantly states she would never receive any further blood transfusions in the future there.  She denies history of IV drug use or inhaled cocaine.  She has not knowingly learned that any of her prior sexual contacts were diagnosed with hepatitis C.  Prior to August, she denies receiving any blood transfusions in the past.  She also denies any tattoos or body piercings beyond her ears.  Prior work-up in the GI clinic this past summer showed that the patient had a negative HIV antibody but confirmed a chronic infection with an HCV viral load of 5.59 million IU and genotype 1a.  She has never previously received treatment for her hepatitis C. Her recent FibroScan study performed on January 25, 2019 confirmed F3 with some F4 fibrosis.  She denies any prior paracenteses but does admit to occasional abdominal bloating.  It is unclear if the patient has developed hepatic encephalopathy in the past but she does admit to frequent episodes of forgetfulness and memory loss.  She has not been considered for a renal transplant due to her poor compliance with hemodialysis since initiation 3 years ago.  She does complain of weakness/fatigue and abdominal bloating in  recent months.  Past Medical History:  Diagnosis Date   Anemia    Anxiety    Bipolar disorder, unspecified ([place]) 02/06/2014   Chronic combined systolic and diastolic CHF (congestive heart failure) (HCC)    EF 40% by echo and 48% by stress test   Chronic pain syndrome    Depression    ESRD on hemodialysis (HCC)    TTS   GERD (gastroesophageal reflux disease)    Glaucoma    Headache    sinus headaches   Hepatitis C    High cholesterol    History of hiatal hernia    History of vitamin D deficiency 06/20/2015   Hypertension    MRSA infection ?2014   "on the back of my head; spread throughout my bloodstream"   Neuropathy    Refusal of blood transfusions as patient is Jehovah's Witness    occasionally allows transfusions.     Schizoaffective disorder, unspecified condition 02/08/2014   Seizure ([place]) dx'd 2014   "don't know what kind; last one was ~ 04/2014"   TIA (transient ischemic attack) 2010   Type II diabetes mellitus ([place])    INSULIN DEPENDENT    Past Surgical History:  Procedure Laterality Date   ABDOMINAL HYSTERECTOMY  2014   ABDOMINAL HYSTERECTOMY  2010   AMPUTATION Left 05/21/2013   Procedure: Left Midfoot AMPUTATION;  Surgeon: Newt Minion, MD;  Location: [place];  Service: Orthopedics;  Laterality: Left;  Left Midfoot Amputation   AV FISTULA PLACEMENT Left 05/04/2015   Procedure: ARTERIOVENOUS (AV) GRAFT INSERTION;  Surgeon: Angelia Mould, MD;  Location: [place];  Service: Vascular;  Laterality: Left;   BIOPSY  11/18/2018   Procedure: BIOPSY;  Surgeon: Jerene Bears, MD;  Location: [place];  Service: Gastroenterology;;   COLONOSCOPY WITH PROPOFOL N/A 06/22/2013   Procedure: COLONOSCOPY WITH PROPOFOL;  Surgeon: Jeryl Columbia, MD;  Location: [facility]COPY;  Service: Endoscopy;  Laterality: N/A;   COLONOSCOPY WITH PROPOFOL N/A 11/18/2018   Procedure: COLONOSCOPY WITH PROPOFOL;  Surgeon: Jerene Bears, MD;  Location: [place];   Service: Gastroenterology;  Laterality: N/A;   ENTEROSCOPY N/A 12/31/2018   Procedure: ENTEROSCOPY;  Surgeon: Irene Shipper, MD;  Location: [facility] ENDOSCOPY;  Service: Endoscopy;  Laterality: N/A;  Push enteroscopy at 8am (spoke with Endo)   ESOPHAGOGASTRODUODENOSCOPY N/A 05/11/2015   Procedure: ESOPHAGOGASTRODUODENOSCOPY (EGD);  Surgeon: Laurence Spates, MD;  Location: [facility] ENDOSCOPY;  Service: Endoscopy;  Laterality: N/A;   ESOPHAGOGASTRODUODENOSCOPY (EGD) WITH PROPOFOL N/A 11/18/2018   Procedure: ESOPHAGOGASTRODUODENOSCOPY (EGD) WITH PROPOFOL;  Surgeon: Jerene Bears, MD;  Location: [facility] END[facility]COPY;  Service: Gastroenterology;  Laterality: N/A;   ESOPHAGOGASTRODUODENOSCOPY (EGD) WITH PROPOFOL N/A 05/15/2019   Procedure: ESOPHAGOGASTRODUODENOSCOPY (EGD) WITH PROPOFOL;  Surgeon: Jackquline Denmark, MD;  Location: [facility] ENDOSCOPY;  Service: Endoscopy;  Laterality: N/A;   EYE SURGERY Bilateral 2010   Lasik   FLEXIBLE SIGMOIDOSCOPY N/A 01/17/2017   Procedure: FLEXIBLE SIGMOIDOSCOPY w/ hemorrhoid banding possible;  Surgeon: Gatha Mayer, MD;  Location: [facility] ENDOSCOPY;  Service: Endoscopy;  Laterality: N/A;   FLEXIBLE SIGMOIDOSCOPY N/A 01/21/2019   Procedure: FLEXIBLE SIGMOIDOSCOPY;  Surgeon: Ronald Lobo, MD;  Location: [facility] ENDOSCOPY;  Service: Endoscopy;  Laterality: N/A;  Plan to do the test unprepped, unsedated   FOOT AMPUTATION Left    due diabetic neuropathy, could not feel ulcer on bottom of foot   GIVENS CAPSULE STUDY N/A 01/21/2019   Procedure: GIVENS CAPSULE STUDY;  Surgeon: Ronald Lobo, MD;  Location: [place];  Service: Endoscopy;  Laterality: N/A;   POLYPECTOMY  11/18/2018   Procedure: POLYPECTOMY;  Surgeon: Jerene Bears, MD;  Location: [facility] ENDOSCOPY;  Service: Gastroenterology;;   REFRACTIVE SURGERY Bilateral 2013   TONSILLECTOMY  1970's   TUBAL LIGATION  2000     Family History  Problem Relation Age of Onset   Hypertension Mother    Diabetes Mother    Hypertension Father      Diabetes Father      Social History   Tobacco Use   Smoking status: Current Some Day Smoker    Packs/day: 1.00    Years: 36.00    Pack years: 36.00    Types: Cigarettes   Smokeless tobacco: Never Used   Tobacco comment: 5 cigs in one setting  Substance Use Topics   Alcohol use: Not Currently    Alcohol/week: 3.0 standard drinks    Types: 3 Standard drinks or equivalent per week    Frequency: Never   Drug use: Not Currently    Types: "Crack" cocaine, Cocaine    Comment: last use >2 years ago      reports previously being sexually active. She reports using the following method of birth control/protection: Abstinence.   Allergies  Allergen Reactions   Penicillins Hives and Other (See Comments)    Has patient had a PCN reaction causing immediate rash, facial/tongue/throat swelling, SOB or lightheadedness with hypotension: Yes Has patient had a PCN reaction causing severe rash involving  mucus membranes or skin necrosis: No Has patient had a PCN reaction that required hospitalization No Has patient had a PCN reaction occurring within the last 10 years: No If all of the above answers are "NO", then may proceed with Cephalosporin use. Pt tolerates cefepime and ceftriaxone   Gabapentin Itching and Swelling   Lyrica [Pregabalin] Swelling and Other (See Comments)    Facial and leg swelling   Saphris [Asenapine] Swelling and Other (See Comments)    Facial swelling   Tramadol Swelling and Other (See Comments)    Leg swelling   Adhesive [Tape] Itching    SEVERE ITCHING   Latex Itching    SEVERE ITCHING   Other Other (See Comments)    NO BLOOD PRODUCTS; PATIENT IS A JEHOVAH'S WITNESS     Outpatient Medications Prior to Visit  Medication Sig Dispense Refill   acetaminophen (TYLENOL) 325 MG tablet Take 2 tablets (650 mg total) by mouth every 6 (six) hours as needed for fever, headache or moderate pain. 12 tablet 0   albuterol (PROVENTIL) (2.5 MG/3ML) 0.083%  nebulizer solution Take 5 mg by nebulization daily as needed for wheezing or shortness of breath.     albuterol (VENTOLIN HFA) 108 (90 Base) MCG/ACT inhaler Inhale 2 puffs into the lungs every 4 (four) hours as needed for wheezing or shortness of breath. 6.7 g 0   allopurinol (ZYLOPRIM) 100 MG tablet Take 2 tablets (200 mg total) by mouth daily. 60 tablet 3   ALPRAZolam (XANAX) 0.5 MG tablet Take 0.5 mg by mouth 3 (three) times daily as needed for anxiety.     amLODipine (NORVASC) 2.5 MG tablet Take 2.5 mg by mouth daily.     atorvastatin (LIPITOR) 40 MG tablet Take 1 tablet (40 mg total) by mouth at bedtime. 30 tablet 3   citalopram (CELEXA) 20 MG tablet      Darbepoetin Alfa (ARANESP) 200 MCG/0.4ML SOSY injection Inject 0.4 mLs (200 mcg total) into the vein every Wednesday with hemodialysis. 1.68 mL    diphenhydrAMINE (BENADRYL) 25 mg capsule Take 1 capsule (25 mg total) by mouth every 6 (six) hours as needed for up to 8 days for itching. 30 capsule 0   furosemide (LASIX) 80 MG tablet Take 80 mg by mouth 2 (two) times daily.     hydrocortisone (ANUSOL-HC) 2.5 % rectal cream Place 1 application rectally 2 (two) times daily. 28 g 1   hydrOXYzine (ATARAX/VISTARIL) 25 MG tablet Take 1 tablet (25 mg total) by mouth 3 (three) times daily as needed for itching or anxiety. 30 tablet 0   insulin aspart (NOVOLOG) 100 UNIT/ML injection 0-9 Units, Subcutaneous, 3 times daily with meals CBG < 70: implement hypoglycemia protocol CBG 70 - 120: 0 units CBG 121 - 150: 1 unit CBG 151 - 200: 2 units CBG 201 - 250: 3 units CBG 251 - 300: 5 units CBG 301 - 350: 7 units CBG 351 - 400: 9 units CBG > 400: call MD (Patient taking differently: Inject 0-9 Units into the skin 3 (three) times daily with meals. 0-9 Units, Subcutaneous, 3 times daily with meals CBG < 70: implement hypoglycemia protocol CBG 70 - 120: 0 units CBG 121 - 150: 1 unit CBG 151 - 200: 2 units CBG 201 - 250: 3 units CBG 251 - 300: 5 units CBG 301  - 350: 7 units CBG 351 - 400: 9 units CBG > 400: call MD) 10 mL 11   insulin glargine (LANTUS) 100 UNIT/ML injection Inject 24  Units into the skin at bedtime.     lidocaine-prilocaine (EMLA) cream Apply 1 application topically See admin instructions. On dialysis days     methocarbamol (ROBAXIN) 500 MG tablet Take 500 mg by mouth daily as needed for muscle spasms.     mupirocin ointment (BACTROBAN) 2 % Apply 1 application topically as needed (on arm). 22 g 2   NARCAN 4 MG/0.1ML LIQD nasal spray kit      Oxycodone HCl 10 MG TABS Take 1 tablet (10 mg total) by mouth 4 (four) times daily. 15 tablet 0   oxymetazoline (AFRIN) 0.05 % nasal spray Place 2 sprays into both nostrils 2 (two) times daily. 20 mL 0   pantoprazole (PROTONIX) 40 MG tablet Take 1 tablet (40 mg total) by mouth daily at 6 (six) AM for 30 days. 30 tablet 0   permethrin (ELIMITE) 5 % cream      Pramoxine-Menthol-Petrolatum (SARNA ULTRA EX) Apply 1 application topically daily as needed (itch).     senna-docusate (SENOKOT-S) 8.6-50 MG tablet Take 2 tablets by mouth at bedtime. 60 tablet 1   sulfamethoxazole-trimethoprim (BACTRIM DS) 800-160 MG tablet      traZODone (DESYREL) 50 MG tablet Take 0.5 tablets (25 mg total) by mouth at bedtime. 15 tablet 0   ALPRAZolam (XANAX) 1 MG tablet      amLODipine (NORVASC) 5 MG tablet      No facility-administered medications prior to visit.      Review of Systems  Constitutional: Positive for fatigue. Negative for chills and fever.  HENT: Negative for congestion, hearing loss, sinus pressure and trouble swallowing.   Eyes: Negative for photophobia, discharge, redness and visual disturbance.  Respiratory: Negative for apnea, cough, shortness of breath and wheezing.   Cardiovascular: Negative for chest pain and leg swelling.  Gastrointestinal: Positive for abdominal distention. Negative for abdominal pain, constipation, diarrhea, nausea and vomiting.  Endocrine: Negative for cold  intolerance, heat intolerance, polydipsia and polyuria.  Genitourinary: Positive for difficulty urinating. Negative for dysuria, flank pain, frequency, urgency, vaginal bleeding and vaginal discharge.  Musculoskeletal: Positive for back pain. Negative for arthralgias, joint swelling and neck pain.  Skin: Negative for pallor and rash.  Allergic/Immunologic: Negative for immunocompromised state.  Neurological: Positive for dizziness. Negative for seizures, speech difficulty, weakness and headaches.  Hematological: Does not bruise/bleed easily.  Psychiatric/Behavioral: Negative for agitation, confusion, hallucinations and sleep disturbance. The patient is not nervous/anxious.      Vitals:   05/27/19 1357  BP: 138/66  Pulse: 80  Temp: 98.1 F (36.7 C)     Physical Exam Gen: chronically ill, appears older than stated age, strange affect, NAD, A&Ox 3 Head: NCAT, no temporal wasting evident EENT: PERRL, EOMI, MMM, adequate dentition Neck: supple, no JVD CV: NRRR, no murmurs evident Pulm: CTA bilaterally, no wheeze or retractions Abd: soft, NT, diffuse abdominal distension but no distinct ascites, +BS Extrems: trace LE edema, 2+ pulses, LT forearm AVF intact with good palpable thrill and audible bruit Skin: no rashes, adequate skin turgor Neuro: CN II-XII grossly intact, no focal neurologic deficits appreciated, gait was normal, A&Ox 3   Labs: Lab Results  Component Value Date   HEPBSAG Negative 03/10/2019   HIV Ab - negative on 04/01/2019  HCV viral load on 5.59 million IU on 01/25/2019  No results found for: [facility]  Lab Results  Component Value Date   HCVGENOTYPE Comment 01/24/2019  HCV genotype 1a  Lab Results  Component Value Date   WBC 5.7 05/15/2019  HGB 7.9 (L) 05/15/2019   HCT 22.8 (L) 05/15/2019   MCV 91.2 05/15/2019   PLT 108 (L) 05/15/2019       Chemistry      Component Value Date/Time   NA 133 (L) 05/15/2019 1208   NA 130 (A) 12/13/2015   K 4.7  05/15/2019 1208   CL 98 05/15/2019 1208   CO2 19 (L) 05/15/2019 1208   BUN 53 (H) 05/15/2019 1208   BUN 68 (A) 12/13/2015   CREATININE 6.67 (H) 05/15/2019 1208   GLU 129 12/13/2015      Component Value Date/Time   CALCIUM 7.7 (L) 05/15/2019 1208   ALKPHOS 93 05/13/2019 1812   AST 43 (H) 05/13/2019 1812   ALT 63 (H) 05/13/2019 1812   BILITOT 1.2 05/13/2019 1812     FibroScan EXAM: US LIVER ELASTOGRAPHY  TECHNIQUE: Ultrasound elastography evaluation of the liver was performed. A region of interest was placed in the right lobe of the liver. Following application of a compressive sonographic pulse, shear waves were detected in the adjacent hepatic tissue and the shear wave velocity was calculated. Multiple assessments were performed at the selected site. Median shear wave velocity is correlated to a Metavir fibrosis score.  COMPARISON:  CT scan 01/22/2019  FINDINGS: Liver: Irregular liver contour and coarse liver echotexture suggesting cirrhotic changes but no focal hepatic lesions. No intrahepatic biliary dilatation. Small gallstones are noted in the gallbladder without findings for acute cholecystitis. Portal vein is patent on color Doppler imaging with normal direction of blood flow towards the liver.  ULTRASOUND HEPATIC ELASTOGRAPHY  Device: Siemens Helix VTQ  Patient position: Supine  Transducer: [id number]  Number of measurements: 10  Hepatic segment:  8  Median velocity:   2.28 m/sec  IQR: 0.17  IQR/Median velocity ratio: 0.07  Corresponding Metavir fibrosis score:  Some F3 + F4  Risk of fibrosis: High  Limitations of exam: None  Please note that abnormal shear wave velocities may also be identified in clinical settings other than with hepatic fibrosis, such as: acute hepatitis, elevated right heart and central venous pressures including use of beta blockers, veno-occlusive disease (Budd-Chiari), infiltrative processes such  as mastocytosis/amyloidosis/infiltrative tumor, extrahepatic cholestasis, in the post-prandial state, and liver transplantation. Correlation with patient history, laboratory data, and clinical condition recommended.  IMPRESSION: Liver: Cirrhotic appearing changes involving the liver but no focal hepatic lesions.  Small gallstones are noted.  Elastography: Median hepatic shear wave velocity is calculated at 2.28 m/sec.  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   Assessment/Plan: Patient is a 53 year old African-American female Jehovah's Witness with gastroesophageal reflux disease and history of seizures as well as end-stage renal disease on hemodialysis with a recent GI bleed status post blood transfusion and a longstanding history of noncompliance with dialysis is presenting today for an evaluation for chronic hepatitis C.  HCV - HCV Ab was positive initially in 2016, thus confirming past exposure to pathogen. Note: this screening test will remain positive/reactive life-long even if HCV infection has been cured/immunologically cleared.  Her risk factor for acquisition of hepatitis C is unclear at this time.  Although chronic infection with hepatitis C has been confirmed with genotype 1a and HCV viral load of 5.59 million IU, there are several barriers to successful treatment in this patient.  The most pressing of which is her medical noncompliance with hemodialysis combined with her FibroScan results suggesting some F4 fibrosis/cirrhosis.  The patient is currently missing dialysis once weekly which makes it difficult to appreciate  as to whether she has late stage complications of her cirrhosis or whether her hemodialysis is actually compensating for what would otherwise be ascites.  Furthermore, only one DAA medication option (Mavyret) is FDA approved for use in hemodialysis patients.  Her history of seizures and gastroesophageal reflux disease further complicate treatment  options as well.  Ordinarily, the addition of Ribavarin would be considered for treatment given her FibroScan results but given her recent anemia and her status is Jehovah's Witness (the patient has confirmed she will never receive another blood transfusion again) would be considered, but this would be viewed is high risk therapy in this patient.  For this reason, if she were to be treated she would require 6 months of [place].  Given the prolonged duration, and the patient's pre-existing medical noncompliance I am pessimistic as to whether she would adhere to medications the level that she would establish a cure for her hepatitis C.  I have emphasized the importance of medical compliance with the patient and suggested that she missed no dialysis sessions in the next 3 months and if this can be accomplished, then she will be reconsidered for hepatitis C treatment at that time.  End-stage renal disease -unfortunately, I am unable to identify the specific nephrologist that is caring for the patient and knows her best.  I actually called the patient's dialysis center and effort to discern who her primary nephrologist was only to learn that the entire group of supervises all the patient's care at her facility.  We will send this note over to them as I will need to have a conversation with at least one of the nephrologist that frequently manages her care to impress upon them the importance that she maintain all of her dialysis sessions for at least 3 months if not longer before she would even be considered for hepatitis C treatment.  As Mavyret is the only option approved for dialysis patients for hepatitis C currently the pharmacokinetics would depend on the patient receiving her 3 times weekly dialysis so that we would be best able to determine whether she is developing decompensation of her cirrhosis due to shifts in her volume status, especially as Mavyret does carry a black box warning advising no use for patients  with decompensated cirrhosis due to all cause mortality risks.  Health maintenance - I have counselled the patient extensively re: the need for barrier precautions with sexual activity in order to prevent sexual transmission of HCV. She must continue these practices until 3 months after treatment completion until a sustained virologic response (SVR) has been confirmed to establish a cure of her infection. She expressed full understanding of these instructions. Vaccination series for hepatitis A & B were both initiated today.

## 2019-05-27 NOTE — Patient Instructions (Signed)
Call our office with the name and phone number of your nephrologist, so Dr. Prince Rome can speak to them about your dialysis and HCV. Miss no sessions of dialysis for 3 months.

## 2019-06-02 ENCOUNTER — Other Ambulatory Visit: Payer: Self-pay | Admitting: Cardiology

## 2019-06-02 ENCOUNTER — Other Ambulatory Visit (HOSPITAL_COMMUNITY): Payer: Self-pay | Admitting: Cardiology

## 2019-06-02 DIAGNOSIS — R079 Chest pain, unspecified: Secondary | ICD-10-CM

## 2019-06-10 ENCOUNTER — Ambulatory Visit (HOSPITAL_COMMUNITY): Admission: RE | Admit: 2019-06-10 | Payer: Medicare Other | Source: Ambulatory Visit

## 2019-06-21 ENCOUNTER — Telehealth: Payer: Self-pay | Admitting: Pharmacy Technician

## 2019-06-21 NOTE — Telephone Encounter (Signed)
[facility] Patient Teacher, [name].    The patient is insured through [facility].  We will continue to follow to see if copay assistance is needed.  Venida Jarvis. Nadara Mustard [place] Patient [facility] for Infectious Disease Phone: [phone] Fax:  [phone]

## 2019-06-22 ENCOUNTER — Ambulatory Visit: Payer: Medicare Other | Admitting: Infectious Diseases

## 2019-06-24 ENCOUNTER — Ambulatory Visit (HOSPITAL_COMMUNITY): Payer: Medicare Other

## 2019-06-25 IMAGING — DX DG CHEST 2V
2 series · 2 of 2 positions shown · non-contrast
Comparison: 08/07/2017, 07/16/2017

CLINICAL DATA: Chest pain short of breath weakness

EXAM:
CHEST - 2 VIEW

[chest lat]
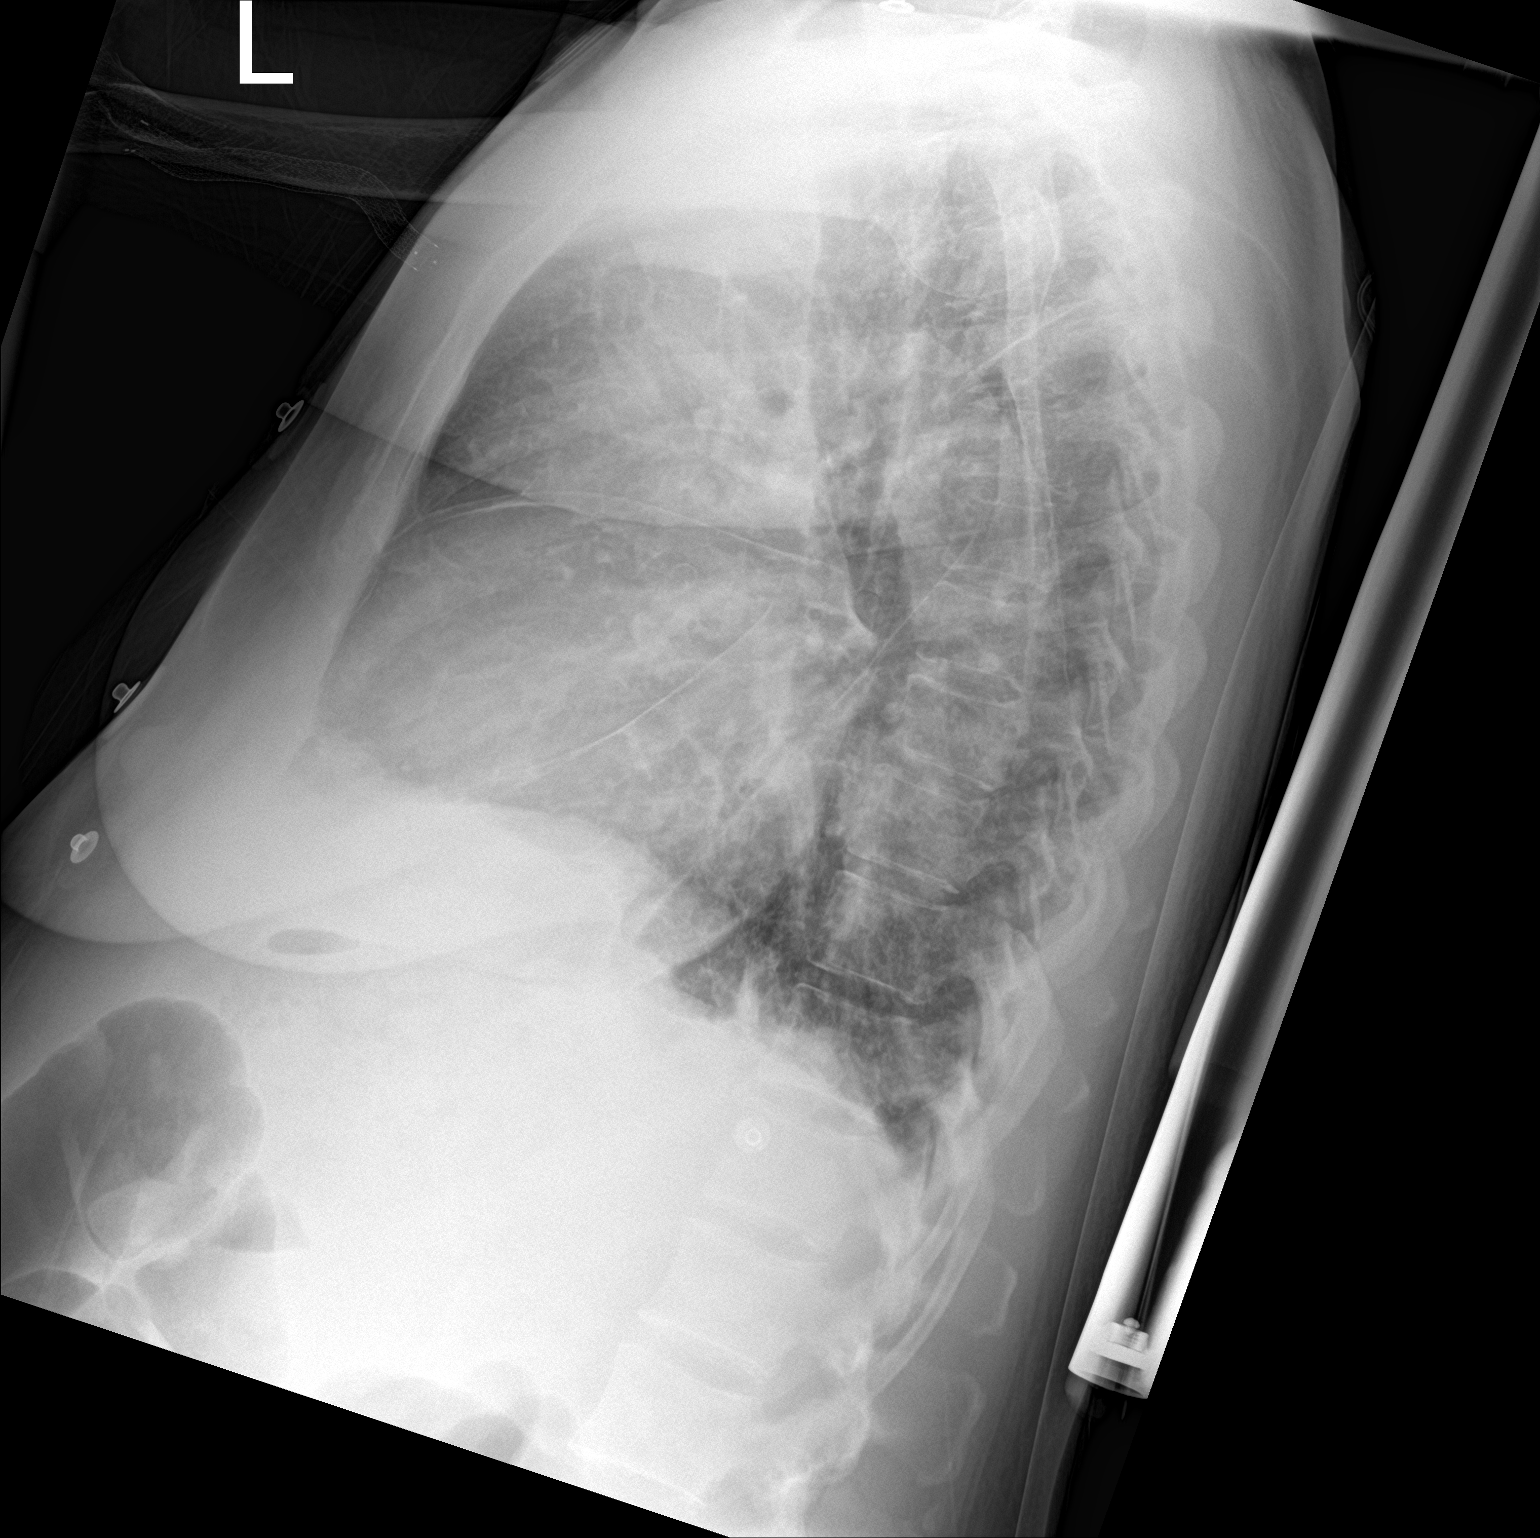

[chest ap]
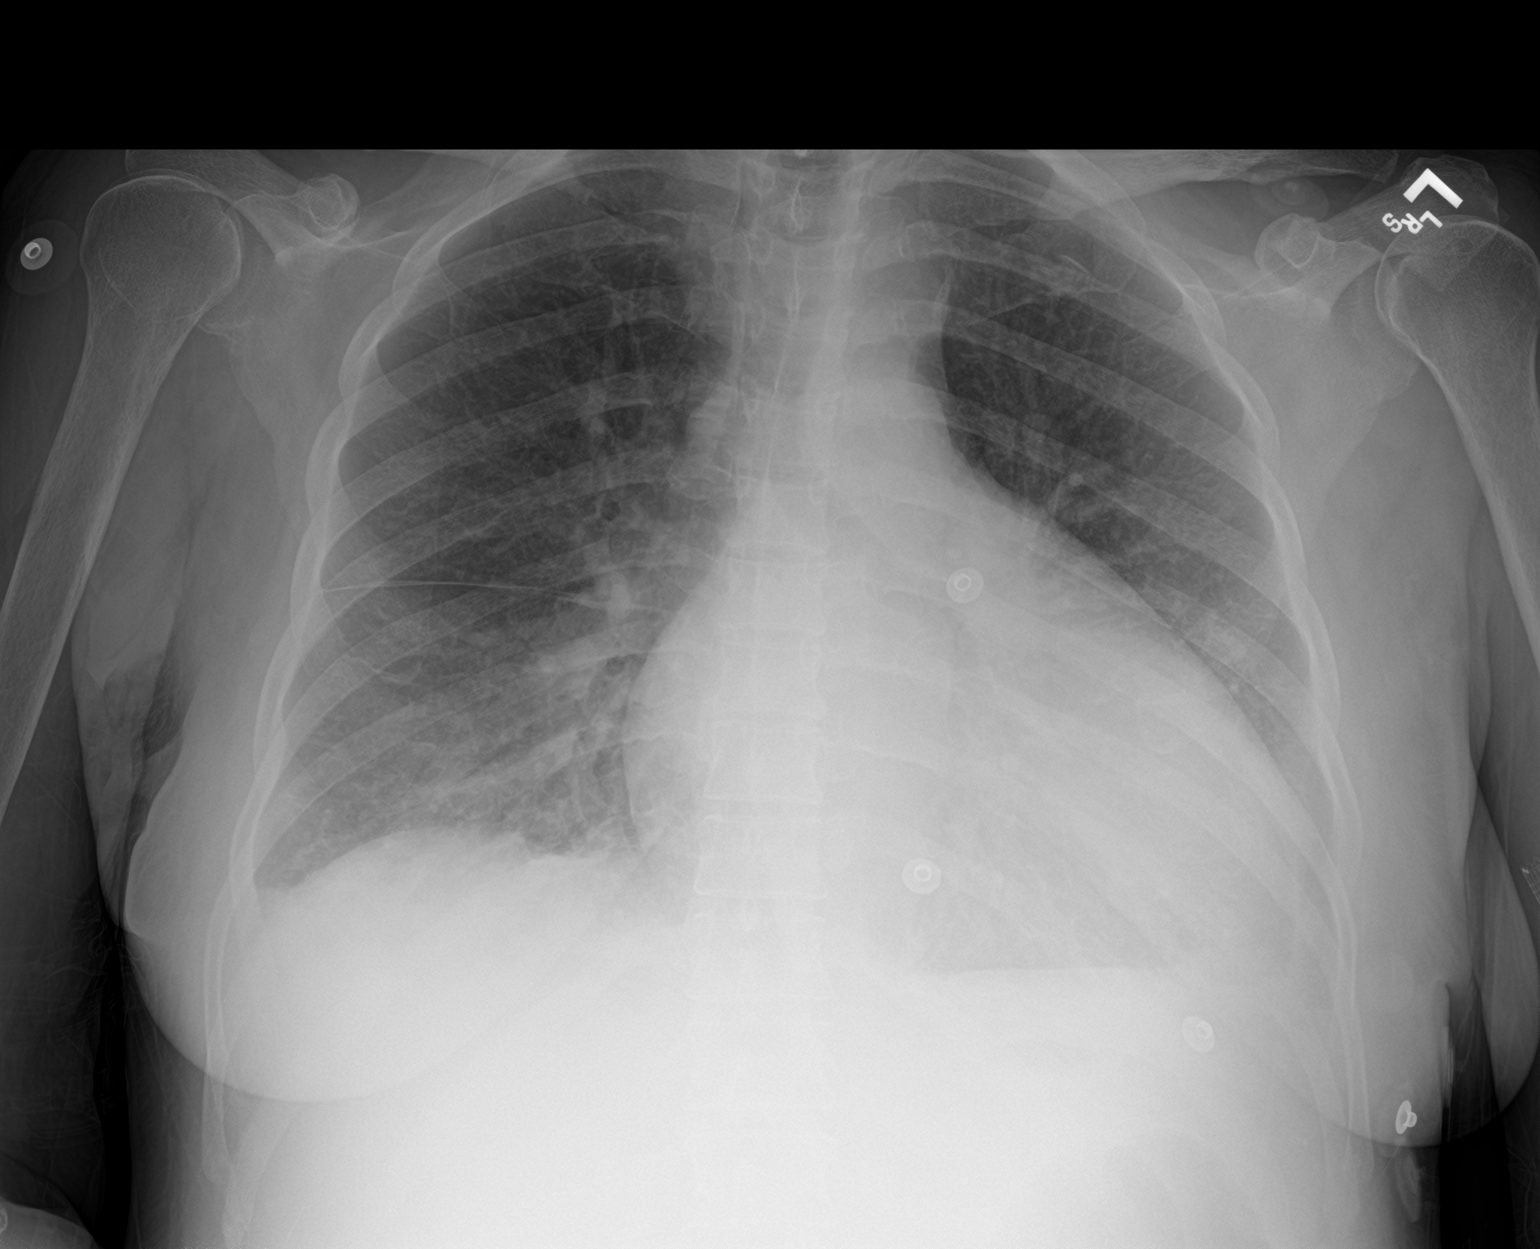

[2 of 2 positions shown; findings below may reference images not displayed]

FINDINGS: No significant pleural effusion. Cardiomegaly with vascular
congestion and mild pulmonary edema. No focal airspace disease. No
pneumothorax.
IMPRESSION: Cardiomegaly with vascular congestion and minimal interstitial
edema.

## 2019-07-12 ENCOUNTER — Encounter (HOSPITAL_COMMUNITY): Payer: Self-pay | Admitting: Emergency Medicine

## 2019-07-12 ENCOUNTER — Other Ambulatory Visit: Payer: Self-pay

## 2019-07-12 ENCOUNTER — Emergency Department (HOSPITAL_COMMUNITY): Payer: Medicare Other

## 2019-07-12 ENCOUNTER — Inpatient Hospital Stay (HOSPITAL_COMMUNITY)
Admission: EM | Admit: 2019-07-12 | Discharge: 2019-07-16 | DRG: 981 | Disposition: A | Discharge status: Home Health | Payer: Medicare Other | Attending: Internal Medicine | Admitting: Internal Medicine

## 2019-07-12 DIAGNOSIS — D6959 Other secondary thrombocytopenia: Secondary | ICD-10-CM | POA: Diagnosis present

## 2019-07-12 DIAGNOSIS — E1169 Type 2 diabetes mellitus with other specified complication: Secondary | ICD-10-CM | POA: Diagnosis present

## 2019-07-12 DIAGNOSIS — E875 Hyperkalemia: Secondary | ICD-10-CM | POA: Diagnosis present

## 2019-07-12 DIAGNOSIS — E871 Hypo-osmolality and hyponatremia: Secondary | ICD-10-CM | POA: Diagnosis not present

## 2019-07-12 DIAGNOSIS — D62 Acute posthemorrhagic anemia: Secondary | ICD-10-CM | POA: Diagnosis present

## 2019-07-12 DIAGNOSIS — Z833 Family history of diabetes mellitus: Secondary | ICD-10-CM

## 2019-07-12 DIAGNOSIS — H409 Unspecified glaucoma: Secondary | ICD-10-CM | POA: Diagnosis present

## 2019-07-12 DIAGNOSIS — K7460 Unspecified cirrhosis of liver: Secondary | ICD-10-CM | POA: Diagnosis present

## 2019-07-12 DIAGNOSIS — I440 Atrioventricular block, first degree: Secondary | ICD-10-CM | POA: Diagnosis present

## 2019-07-12 DIAGNOSIS — I4581 Long QT syndrome: Secondary | ICD-10-CM | POA: Diagnosis not present

## 2019-07-12 DIAGNOSIS — Z885 Allergy status to narcotic agent status: Secondary | ICD-10-CM

## 2019-07-12 DIAGNOSIS — E1165 Type 2 diabetes mellitus with hyperglycemia: Secondary | ICD-10-CM | POA: Diagnosis present

## 2019-07-12 DIAGNOSIS — I132 Hypertensive heart and chronic kidney disease with heart failure and with stage 5 chronic kidney disease, or end stage renal disease: Secondary | ICD-10-CM | POA: Diagnosis present

## 2019-07-12 DIAGNOSIS — Z91040 Latex allergy status: Secondary | ICD-10-CM

## 2019-07-12 DIAGNOSIS — I5042 Chronic combined systolic (congestive) and diastolic (congestive) heart failure: Secondary | ICD-10-CM | POA: Diagnosis present

## 2019-07-12 DIAGNOSIS — E8770 Fluid overload, unspecified: Secondary | ICD-10-CM | POA: Diagnosis not present

## 2019-07-12 DIAGNOSIS — Z8249 Family history of ischemic heart disease and other diseases of the circulatory system: Secondary | ICD-10-CM

## 2019-07-12 DIAGNOSIS — K922 Gastrointestinal hemorrhage, unspecified: Secondary | ICD-10-CM

## 2019-07-12 DIAGNOSIS — K5520 Angiodysplasia of colon without hemorrhage: Secondary | ICD-10-CM | POA: Diagnosis not present

## 2019-07-12 DIAGNOSIS — Z91128 Patient's intentional underdosing of medication regimen for other reason: Secondary | ICD-10-CM

## 2019-07-12 DIAGNOSIS — N186 End stage renal disease: Secondary | ICD-10-CM | POA: Diagnosis present

## 2019-07-12 DIAGNOSIS — T383X6A Underdosing of insulin and oral hypoglycemic [antidiabetic] drugs, initial encounter: Secondary | ICD-10-CM | POA: Diagnosis present

## 2019-07-12 DIAGNOSIS — E1122 Type 2 diabetes mellitus with diabetic chronic kidney disease: Secondary | ICD-10-CM | POA: Diagnosis present

## 2019-07-12 DIAGNOSIS — Z794 Long term (current) use of insulin: Secondary | ICD-10-CM

## 2019-07-12 DIAGNOSIS — Z880 Allergy status to penicillin: Secondary | ICD-10-CM

## 2019-07-12 DIAGNOSIS — D631 Anemia in chronic kidney disease: Secondary | ICD-10-CM | POA: Diagnosis present

## 2019-07-12 DIAGNOSIS — K644 Residual hemorrhoidal skin tags: Secondary | ICD-10-CM | POA: Diagnosis present

## 2019-07-12 DIAGNOSIS — I10 Essential (primary) hypertension: Secondary | ICD-10-CM | POA: Diagnosis not present

## 2019-07-12 DIAGNOSIS — Z90710 Acquired absence of both cervix and uterus: Secondary | ICD-10-CM

## 2019-07-12 DIAGNOSIS — F17210 Nicotine dependence, cigarettes, uncomplicated: Secondary | ICD-10-CM | POA: Diagnosis present

## 2019-07-12 DIAGNOSIS — Z79891 Long term (current) use of opiate analgesic: Secondary | ICD-10-CM

## 2019-07-12 DIAGNOSIS — B182 Chronic viral hepatitis C: Secondary | ICD-10-CM | POA: Diagnosis present

## 2019-07-12 DIAGNOSIS — K921 Melena: Secondary | ICD-10-CM | POA: Diagnosis not present

## 2019-07-12 DIAGNOSIS — Z86010 Personal history of colonic polyps: Secondary | ICD-10-CM

## 2019-07-12 DIAGNOSIS — I248 Other forms of acute ischemic heart disease: Secondary | ICD-10-CM | POA: Diagnosis present

## 2019-07-12 DIAGNOSIS — G40909 Epilepsy, unspecified, not intractable, without status epilepticus: Secondary | ICD-10-CM | POA: Diagnosis present

## 2019-07-12 DIAGNOSIS — K5521 Angiodysplasia of colon with hemorrhage: Principal | ICD-10-CM | POA: Diagnosis present

## 2019-07-12 DIAGNOSIS — E785 Hyperlipidemia, unspecified: Secondary | ICD-10-CM | POA: Diagnosis present

## 2019-07-12 DIAGNOSIS — F250 Schizoaffective disorder, bipolar type: Secondary | ICD-10-CM | POA: Diagnosis present

## 2019-07-12 DIAGNOSIS — Z8673 Personal history of transient ischemic attack (TIA), and cerebral infarction without residual deficits: Secondary | ICD-10-CM

## 2019-07-12 DIAGNOSIS — Z91048 Other nonmedicinal substance allergy status: Secondary | ICD-10-CM

## 2019-07-12 DIAGNOSIS — E1140 Type 2 diabetes mellitus with diabetic neuropathy, unspecified: Secondary | ICD-10-CM | POA: Diagnosis present

## 2019-07-12 DIAGNOSIS — E1110 Type 2 diabetes mellitus with ketoacidosis without coma: Secondary | ICD-10-CM

## 2019-07-12 DIAGNOSIS — Z992 Dependence on renal dialysis: Secondary | ICD-10-CM

## 2019-07-12 DIAGNOSIS — Z20828 Contact with and (suspected) exposure to other viral communicable diseases: Secondary | ICD-10-CM | POA: Diagnosis present

## 2019-07-12 DIAGNOSIS — Z79899 Other long term (current) drug therapy: Secondary | ICD-10-CM

## 2019-07-12 DIAGNOSIS — Z9089 Acquired absence of other organs: Secondary | ICD-10-CM

## 2019-07-12 DIAGNOSIS — Z720 Tobacco use: Secondary | ICD-10-CM | POA: Diagnosis not present

## 2019-07-12 DIAGNOSIS — Z8614 Personal history of Methicillin resistant Staphylococcus aureus infection: Secondary | ICD-10-CM

## 2019-07-12 DIAGNOSIS — Z89432 Acquired absence of left foot: Secondary | ICD-10-CM

## 2019-07-12 DIAGNOSIS — K219 Gastro-esophageal reflux disease without esophagitis: Secondary | ICD-10-CM | POA: Diagnosis present

## 2019-07-12 DIAGNOSIS — G894 Chronic pain syndrome: Secondary | ICD-10-CM | POA: Diagnosis present

## 2019-07-12 DIAGNOSIS — Z888 Allergy status to other drugs, medicaments and biological substances status: Secondary | ICD-10-CM

## 2019-07-12 DIAGNOSIS — Z8719 Personal history of other diseases of the digestive system: Secondary | ICD-10-CM

## 2019-07-12 DIAGNOSIS — E1129 Type 2 diabetes mellitus with other diabetic kidney complication: Secondary | ICD-10-CM | POA: Diagnosis present

## 2019-07-12 DIAGNOSIS — Z9115 Patient's noncompliance with renal dialysis: Secondary | ICD-10-CM

## 2019-07-12 LAB — POCT I-STAT EG7
Acid-base deficit: 10 mmol/L — ABNORMAL HIGH (ref 0.0–2.0)
Bicarbonate: 15.5 mmol/L — ABNORMAL LOW (ref 20.0–28.0)
Calcium, Ion: 0.95 mmol/L — ABNORMAL LOW (ref 1.15–1.40)
HCT: 18 % — ABNORMAL LOW (ref 36.0–46.0)
Hemoglobin: 6.1 g/dL — CL (ref 12.0–15.0)
O2 Saturation: 99 %
Potassium: 6.9 mmol/L (ref 3.5–5.1)
Sodium: 132 mmol/L — ABNORMAL LOW (ref 135–145)
TCO2: 16 mmol/L — ABNORMAL LOW (ref 22–32)
pCO2, Ven: 32.7 mmHg — ABNORMAL LOW (ref 44.0–60.0)
pH, Ven: 7.283 (ref 7.250–7.430)
pO2, Ven: 142 mmHg — ABNORMAL HIGH (ref 32.0–45.0)

## 2019-07-12 LAB — CBC
HCT: 19.5 % — ABNORMAL LOW (ref 36.0–46.0)
Hemoglobin: 6.1 g/dL — CL (ref 12.0–15.0)
MCH: 34.1 pg — ABNORMAL HIGH (ref 26.0–34.0)
MCHC: 31.3 g/dL (ref 30.0–36.0)
MCV: 108.9 fL — ABNORMAL HIGH (ref 80.0–100.0)
Platelets: 88 10*3/uL — ABNORMAL LOW (ref 150–400)
RBC: 1.79 MIL/uL — ABNORMAL LOW (ref 3.87–5.11)
RDW: 15.4 % (ref 11.5–15.5)
WBC: 3.6 10*3/uL — ABNORMAL LOW (ref 4.0–10.5)
nRBC: 0 % (ref 0.0–0.2)

## 2019-07-12 LAB — COMPREHENSIVE METABOLIC PANEL
ALT: 38 U/L (ref 0–44)
AST: 32 U/L (ref 15–41)
Albumin: 3.6 g/dL (ref 3.5–5.0)
Alkaline Phosphatase: 78 U/L (ref 38–126)
Anion gap: 27 — ABNORMAL HIGH (ref 5–15)
BUN: 150 mg/dL — ABNORMAL HIGH (ref 6–20)
CO2: 17 mmol/L — ABNORMAL LOW (ref 22–32)
Calcium: 9.1 mg/dL (ref 8.9–10.3)
Chloride: 90 mmol/L — ABNORMAL LOW (ref 98–111)
Creatinine, Ser: 11.49 mg/dL — ABNORMAL HIGH (ref 0.44–1.00)
GFR calc Af Amer: 4 mL/min — ABNORMAL LOW (ref >60)
GFR calc non Af Amer: 3 mL/min — ABNORMAL LOW (ref >60)
Glucose, Bld: 221 mg/dL — ABNORMAL HIGH (ref 70–99)
Potassium: >7.5 mmol/L (ref 3.5–5.1)
Sodium: 134 mmol/L — ABNORMAL LOW (ref 135–145)
Total Bilirubin: 0.7 mg/dL (ref 0.3–1.2)
Total Protein: 7.3 g/dL (ref 6.5–8.1)

## 2019-07-12 LAB — POC SARS CORONAVIRUS 2 AG -  ED: SARS Coronavirus 2 Ag: NEGATIVE

## 2019-07-12 LAB — I-STAT CHEM 8, ED
BUN: 131 mg/dL — ABNORMAL HIGH (ref 6–20)
Calcium, Ion: 0.93 mmol/L — ABNORMAL LOW (ref 1.15–1.40)
Chloride: 105 mmol/L (ref 98–111)
Creatinine, Ser: 11.4 mg/dL — ABNORMAL HIGH (ref 0.44–1.00)
Glucose, Bld: 239 mg/dL — ABNORMAL HIGH (ref 70–99)
HCT: 17 % — ABNORMAL LOW (ref 36.0–46.0)
Hemoglobin: 5.8 g/dL — CL (ref 12.0–15.0)
Potassium: 7 mmol/L (ref 3.5–5.1)
Sodium: 132 mmol/L — ABNORMAL LOW (ref 135–145)
TCO2: 18 mmol/L — ABNORMAL LOW (ref 22–32)

## 2019-07-12 LAB — BASIC METABOLIC PANEL
Anion gap: 23 — ABNORMAL HIGH (ref 5–15)
BUN: 136 mg/dL — ABNORMAL HIGH (ref 6–20)
CO2: 14 mmol/L — ABNORMAL LOW (ref 22–32)
Calcium: 8 mg/dL — ABNORMAL LOW (ref 8.9–10.3)
Chloride: 97 mmol/L — ABNORMAL LOW (ref 98–111)
Creatinine, Ser: 10.26 mg/dL — ABNORMAL HIGH (ref 0.44–1.00)
GFR calc Af Amer: 4 mL/min — ABNORMAL LOW (ref >60)
GFR calc non Af Amer: 4 mL/min — ABNORMAL LOW (ref >60)
Glucose, Bld: 253 mg/dL — ABNORMAL HIGH (ref 70–99)
Potassium: 7.1 mmol/L (ref 3.5–5.1)
Sodium: 134 mmol/L — ABNORMAL LOW (ref 135–145)

## 2019-07-12 LAB — TROPONIN I (HIGH SENSITIVITY)
Troponin I (High Sensitivity): 72 ng/L — ABNORMAL HIGH (ref <18)
Troponin I (High Sensitivity): 62 ng/L — ABNORMAL HIGH (ref <18)

## 2019-07-12 LAB — PREPARE RBC (CROSSMATCH)

## 2019-07-12 LAB — I-STAT BETA HCG BLOOD, ED (MC, WL, AP ONLY): I-stat hCG, quantitative: 8.1 m[IU]/mL — ABNORMAL HIGH (ref <5)

## 2019-07-12 LAB — POC OCCULT BLOOD, ED: Fecal Occult Bld: POSITIVE — AB

## 2019-07-12 LAB — LACTIC ACID, PLASMA: Lactic Acid, Venous: 0.6 mmol/L (ref 0.5–1.9)

## 2019-07-12 MED ORDER — INSULIN ASPART 100 UNIT/ML IV SOLN
5.0000 [IU] | Freq: Once | INTRAVENOUS | Status: AC
  Administered 2019-07-12: 5 [IU] via INTRAVENOUS

## 2019-07-12 MED ORDER — PANTOPRAZOLE SODIUM 40 MG IV SOLR
40.0000 mg | Freq: Once | INTRAVENOUS | Status: AC
  Administered 2019-07-12: 40 mg via INTRAVENOUS
  Filled 2019-07-12: qty 40

## 2019-07-12 MED ORDER — SODIUM CHLORIDE 0.9 % IV SOLN: 1.0000 g | Freq: Once | INTRAVENOUS | Status: DC

## 2019-07-12 MED ORDER — ALBUTEROL SULFATE HFA 108 (90 BASE) MCG/ACT IN AERS
8.0000 | INHALATION_SPRAY | Freq: Once | RESPIRATORY_TRACT | Status: AC
  Administered 2019-07-12: 8 via RESPIRATORY_TRACT
  Filled 2019-07-12: qty 6.7

## 2019-07-12 MED ORDER — CALCIUM GLUCONATE-NACL 1-0.675 GM/50ML-% IV SOLN
1.0000 g | Freq: Once | INTRAVENOUS | Status: AC
  Administered 2019-07-12: 1000 mg via INTRAVENOUS
  Filled 2019-07-12: qty 50

## 2019-07-12 MED ORDER — SODIUM CHLORIDE 0.9 % IV SOLN
10.0000 mL/h | Freq: Once | INTRAVENOUS | Status: AC
  Administered 2019-07-12: 10 mL/h via INTRAVENOUS

## 2019-07-12 MED ORDER — DEXTROSE 50 % IV SOLN
1.0000 | Freq: Once | INTRAVENOUS | Status: AC
  Administered 2019-07-12: 50 mL via INTRAVENOUS
  Filled 2019-07-12: qty 50

## 2019-07-12 MED ORDER — STERILE WATER FOR INJECTION IJ SOLN
INTRAMUSCULAR | Status: AC
  Administered 2019-07-13: 17:00:00
  Filled 2019-07-12: qty 10

## 2019-07-12 MED ORDER — SODIUM BICARBONATE 8.4 % IV SOLN
50.0000 meq | Freq: Once | INTRAVENOUS | Status: AC
  Administered 2019-07-12: 50 meq via INTRAVENOUS
  Filled 2019-07-12: qty 50

## 2019-07-12 MED ORDER — CHLORHEXIDINE GLUCONATE CLOTH 2 % EX PADS: 6.0000 | MEDICATED_PAD | Freq: Every day | CUTANEOUS | Status: DC

## 2019-07-12 NOTE — ED Triage Notes (Signed)
Pt arrives via [facility] from home,pt c/o gi bleeding, hx of the same multiple times. Reports dark, tarry stools x3 days and vomiting- no blood in vomiting. Also endorses cp that began yesterday, pt is on dialysis, missed last 2 sessions, Saturday and today. EMS VS: O2 sat 99% on room air, 188/106, HR 69, RR20, CBG 298, 97.3, temp  Given 4mg  zofran via ems

## 2019-07-12 NOTE — ED Provider Notes (Signed)
[facility] EMERGENCY DEPARTMENT Provider Note   CSN: [id number] Arrival date & time: 07/12/19  1404     History   Chief Complaint Chief Complaint  Patient presents with  . GI Bleeding  . Chest Pain    HPI [name] is a 53 y.o. female.     HPI  Pt is a 53 year old female with past medical history of ESRD (HD on MWF), CHF (last documented EF of 40%), DM2, schizoaffective disorder, diverticulosis, recurrent GI bleeds, hypertension who presents to the ED with concern for dark stools.  Patient reports for the last 4 days she has noticed worsening dark black stools.  She says she has had this happen in the past.  Patient has also been experiencing nausea and vomiting over the last week but denies any blood in her emesis. Abdominal pain.  She states that yesterday she has noted some chest pain but has difficulty characterizing the chest pain.  Patient states she has not went to dialysis since last Monday because she has been feeling unwell.  She also reports she has not used her insulin since Friday.  Patient denies any fever cough.  She states she does feel mildly short of breath and noticed more fluid on her legs.  Patient states her legs feel wobbly because there is fluid on them.  She denies any focal weakness.  No headache.  Patient had similar presentation last month.  Past Medical History:  Diagnosis Date  . Anemia   . Anxiety   . Bipolar disorder, unspecified ([place]) 02/06/2014  . Chronic combined systolic and diastolic CHF (congestive heart failure) (HCC)    EF 40% by echo and 48% by stress test  . Chronic pain syndrome   . Depression   . ESRD on hemodialysis (HCC)    TTS  . GERD (gastroesophageal reflux disease)   . Glaucoma   . Headache    sinus headaches  . Hepatitis C   . High cholesterol   . History of hiatal hernia   . History of vitamin D deficiency 06/20/2015  . Hypertension   . MRSA infection ?2014   "on the back of my head; spread  throughout my bloodstream"  . Neuropathy   . Refusal of blood transfusions as patient is Jehovah's Witness    occasionally allows transfusions.    . Schizoaffective disorder, unspecified condition 02/08/2014  . Seizure [facility]) dx'd 2014   "don't know what kind; last one was ~ 04/2014"  . TIA (transient ischemic attack) 2010  . Type II diabetes mellitus ([place])    INSULIN DEPENDENT    Patient Active Problem List   Diagnosis Date Noted  . Acute GI bleeding 05/13/2019  . DKA (diabetic ketoacidoses) ([place]) 05/13/2019  . Symptomatic anemia 04/01/2019  . Type II diabetes mellitus with renal manifestations ([place]) 02/25/2019  . TIA (transient ischemic attack) 02/25/2019  . GERD (gastroesophageal reflux disease) 02/25/2019  . Tobacco abuse 02/25/2019  . Fluid overload 02/25/2019  . Uremia 02/25/2019  . Melena   . Rectal bleed 12/30/2018  . Gastritis and gastroduodenitis   . Benign neoplasm of ascending colon   . Benign neoplasm of transverse colon   . Benign neoplasm of descending colon   . GI bleeding 11/17/2018  . Acute blood loss anemia   . No blood products   . GI bleed 10/05/2018  . Prolonged QT interval 10/05/2018  . Localized edema due to fluid overload 09/05/2017  . Lower GI bleed   .  Evaluation by psychiatric service required 07/17/2017  . Volume overload 06/03/2017  . Right supracondylar humerus fracture, with nonunion, subsequent encounter 05/01/2017  . Malnutrition of moderate degree 02/28/2017  . Leg pain, left 02/25/2017  . Left leg pain 02/25/2017  . External hemorrhoids   . Diverticulosis of colon without hemorrhage   . Goals of care, counseling/discussion   . Palliative care encounter   . Cellulitis of left leg 01/16/2017  . Upper GI bleed 01/16/2017  . UGIB (upper gastrointestinal bleed)   . Anemia due to chronic kidney disease   . Cellulitis of left thigh 01/01/2017  . ESRD (end stage renal disease) ([place]) 01/01/2017  . Cocaine use disorder, severe, dependence  ([place]) 08/29/2016  . Hepatitis C 08/29/2016  . Diabetic ulcer of calf ([place]) 08/29/2016  . Diabetic ulcer of toe ([place]) 08/29/2016  . Cerebrovascular accident (CVA) ([place]) 08/29/2016  . MDD (major depressive disorder) 08/28/2016  . Dyslipidemia associated with type 2 diabetes mellitus ([place]) 11/10/2015  . Hyperkalemia 09/17/2015  . History of vitamin D deficiency 06/20/2015  . Long term prescription opiate use 06/20/2015  . Chronic pain syndrome 06/19/2015  . Anemia of chronic kidney failure 05/01/2015  . transmetatarsal amputation of the left foot 12/13/2014  . Unilateral visual loss 11/22/2014  . 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 ([place]) 11/22/2014  . Elevated troponin 11/09/2014  . Essential hypertension 11/09/2014  . Dizziness   . Chronic combined systolic (congestive) and diastolic (congestive) heart failure ([place])   . Chest pain 08/25/2014  . Rectal bleeding 04/23/2014  . Anemia, iron deficiency 04/13/2014  . Noncompliance 02/02/2014  . Hyponatremia 05/19/2013  . Seizure disorder ([place]) 02/09/2013    Past Surgical History:  Procedure Laterality Date  . ABDOMINAL HYSTERECTOMY  2014  . ABDOMINAL HYSTERECTOMY  2010  . AMPUTATION Left 05/21/2013   Procedure: Left Midfoot AMPUTATION;  Surgeon: Newt Minion, MD;  Location: [facility];  Service: Orthopedics;  Laterality: Left;  Left Midfoot Amputation  . AV FISTULA PLACEMENT Left 05/04/2015   Procedure: ARTERIOVENOUS (AV) GRAFT INSERTION;  Surgeon: Angelia Mould, MD;  Location: [facility];  Service: Vascular;  Laterality: Left;  . BIOPSY  11/18/2018   Procedure: BIOPSY;  Surgeon: Jerene Bears, MD;  Location: [facility] ENDOSCOPY;  Service: Gastroenterology;;  . COLONOSCOPY WITH PROPOFOL N/A 06/22/2013   Procedure: COLONOSCOPY WITH PROPOFOL;  Surgeon: Jeryl Columbia, MD;  Location: [facility] ENDOSCOPY;  Service: Endoscopy;  Laterality: N/A;  . COLONOSCOPY WITH PROPOFOL N/A 11/18/2018   Procedure: COLONOSCOPY WITH PROPOFOL;  Surgeon: Jerene Bears, MD;  Location: [facility];  Service: Gastroenterology;  Laterality: N/A;  . ENTEROSCOPY N/A 12/31/2018   Procedure: ENTEROSCOPY;  Surgeon: Irene Shipper, MD;  Location[facility] END[facility]COPY;  Service: Endoscopy;  Laterality: N/A;  Push enteroscopy at 8am (spoke with Endo)  . ESOPHAGOGASTRODUODENOSCOPY N/A 05/11/2015   Procedure: ESOPHAGOGASTRODUODENOSCOPY (EGD);  Surgeon: Laurence Spates, MD;  Location: [facility] END[facility]COPY;  Service: Endoscopy;  Laterality: N/A;  . ESOPHAGOGASTRODUODENOSCOPY (EGD) WITH PROPOFOL N/A 11/18/2018   Procedure: ESOPHAGOGASTRODUODENOSCOPY (EGD) WITH PROPOFOL;  Surgeon: Jerene Bears, MD;  Location: [facility] ENDOSCOPY;  Service: Gastroenterology;  Laterality: N/A;  . ESOPHAGOGASTRODUODENOSCOPY (EGD) WITH PROPOFOL N/A 05/15/2019   Procedure: ESOPHAGOGASTRODUODENOSCOPY (EGD) WITH PROPOFOL;  Surgeon: Jackquline Denmark, MD;  Location: [facility] ENDOSCOPY;  Service: Endoscopy;  Laterality: N/A;  . EYE SURGERY Bilateral 2010   Lasik  . FLEXIBLE SIGMOIDOSCOPY N/A 01/17/2017   Procedure: FLEXIBLE SIGMOIDOSCOPY w/ hemorrhoid banding possible;  Surgeon: Gatha Mayer, MD;  Location:  [facility] ENDOSCOPY;  Service: Endoscopy;  Laterality: N/A;  . FLEXIBLE SIGMOIDOSCOPY N/A 01/21/2019   Procedure: FLEXIBLE SIGMOIDOSCOPY;  Surgeon: Ronald Lobo, MD;  Location: [facility] ENDOSCOPY;  Service: Endoscopy;  Laterality: N/A;  Plan to do the test unprepped, unsedated  . FOOT AMPUTATION Left    due diabetic neuropathy, could not feel ulcer on bottom of foot  . GIVENS CAPSULE STUDY N/A 01/21/2019   Procedure: GIVENS CAPSULE STUDY;  Surgeon: Ronald Lobo, MD;  Location: [facility] ENDOSCOPY;  Service: Endoscopy;  Laterality: N/A;  . POLYPECTOMY  11/18/2018   Procedure: POLYPECTOMY;  Surgeon: Jerene Bears, MD;  Location: [facility] ENDOSCOPY;  Service: Gastroenterology;;  . REFRACTIVE SURGERY Bilateral 2013  . TONSILLECTOMY  1970's  . TUBAL LIGATION  2000     OB History    Gravida  0   Para  0   Term  0   Preterm  0   AB  0   Living         SAB  0   TAB  0   Ectopic  0   Multiple      Live Births               Home Medications    Prior to Admission medications   Medication Sig Start Date End Date Taking? Authorizing Provider  acetaminophen (TYLENOL) 325 MG tablet Take 2 tablets (650 mg total) by mouth every 6 (six) hours as needed for fever, headache or moderate pain. 02/26/19  Yes Emokpae, Courage, MD  albuterol (PROVENTIL) (2.5 MG/3ML) 0.083% nebulizer solution Take 5 mg by nebulization daily as needed for wheezing or shortness of breath.   Yes [provider]  albuterol (VENTOLIN HFA) 108 (90 Base) MCG/ACT inhaler Inhale 2 puffs into the lungs every 4 (four) hours as needed for wheezing or shortness of breath. 01/26/19  Yes Dahal, Marlowe Aschoff, MD  allopurinol (ZYLOPRIM) 100 MG tablet Take 2 tablets (200 mg total) by mouth daily. 02/26/19 07/12/19 Yes Emokpae, Courage, MD  ALPRAZolam Duanne Moron) 0.5 MG tablet Take 0.5 mg by mouth 3 (three) times daily as needed for anxiety. 02/16/19  Yes [provider]  amLODipine (NORVASC) 2.5 MG tablet Take 2.5 mg by mouth daily. 03/16/19  Yes [provider]  atorvastatin (LIPITOR) 40 MG tablet Take 1 tablet (40 mg total) by mouth at bedtime. 02/26/19  Yes Emokpae, Courage, MD  citalopram (CELEXA) 20 MG tablet Take 20 mg by mouth daily.  05/21/19  Yes [provider]  Darbepoetin Alfa (ARANESP) 200 MCG/0.4ML SOSY injection Inject 0.4 mLs (200 mcg total) into the vein every Wednesday with hemodialysis. 01/27/19  Yes Dahal, Marlowe Aschoff, MD  diphenhydrAMINE (BENADRYL) 25 mg capsule Take 1 capsule (25 mg total) by mouth every 6 (six) hours as needed for up to 8 days for itching. 01/26/19 02/25/28 Yes Dahal, Marlowe Aschoff, MD  furosemide (LASIX) 80 MG tablet Take 80 mg by mouth 2 (two) times daily.   Yes [provider]  hydrocortisone (ANUSOL-HC) 2.5 % rectal cream Place 1 application rectally 2 (two) times daily. 04/03/19  Yes Danford, Suann Larry, MD   hydrOXYzine (ATARAX/VISTARIL) 25 MG tablet Take 1 tablet (25 mg total) by mouth 3 (three) times daily as needed for itching or anxiety. 02/26/19  Yes Emokpae, Courage, MD  insulin aspart (NOVOLOG) 100 UNIT/ML injection 0-9 Units, Subcutaneous, 3 times daily with meals CBG < 70: implement hypoglycemia protocol CBG 70 - 120: 0 units CBG 121 - 150: 1 unit CBG 151 - 200: 2 units CBG  201 - 250: 3 units CBG 251 - 300: 5 units CBG 301 - 350: 7 units CBG 351 - 400: 9 units CBG > 400: call MD Patient taking differently: Inject 0-9 Units into the skin 3 (three) times daily with meals. 0-9 Units, Subcutaneous, 3 times daily with meals CBG < 70: implement hypoglycemia protocol CBG 70 - 120: 0 units CBG 121 - 150: 1 unit CBG 151 - 200: 2 units CBG 201 - 250: 3 units CBG 251 - 300: 5 units CBG 301 - 350: 7 units CBG 351 - 400: 9 units CBG > 400: call MD 01/26/19  Yes Dahal, Marlowe Aschoff, MD  insulin glargine (LANTUS) 100 UNIT/ML injection Inject 24 Units into the skin at bedtime.   Yes [provider]  lidocaine-prilocaine (EMLA) cream Apply 1 application topically See admin instructions. On dialysis days 12/23/18  Yes [provider]  methocarbamol (ROBAXIN) 500 MG tablet Take 500 mg by mouth daily as needed for muscle spasms.   Yes [provider]  mupirocin ointment (BACTROBAN) 2 % Apply 1 application topically as needed (on arm). 04/03/19  Yes Danford, Suann Larry, MD  Oxycodone HCl 10 MG TABS Take 1 tablet (10 mg total) by mouth 4 (four) times daily. 04/03/19  Yes Danford, Suann Larry, MD  oxymetazoline (AFRIN) 0.05 % nasal spray Place 2 sprays into both nostrils 2 (two) times daily. 01/26/19  Yes Dahal, Marlowe Aschoff, MD  Pramoxine-Menthol-Petrolatum (SARNA ULTRA EX) Apply 1 application topically daily as needed (itch).   Yes [provider]  senna-docusate (SENOKOT-S) 8.6-50 MG tablet Take 2 tablets by mouth at bedtime. 02/26/19 02/26/20 Yes Emokpae, Courage, MD  pantoprazole (PROTONIX) 40 MG  tablet Take 1 tablet (40 mg total) by mouth daily at 6 (six) AM for 30 days. 01/26/19 05/13/19  Terrilee Croak, MD    Family History Family History  Problem Relation Age of Onset  . Hypertension Mother   . Diabetes Mother   . Hypertension Father   . Diabetes Father     Social History Social History   Tobacco Use  . Smoking status: Current Some Day Smoker    Packs/day: 1.00    Years: 36.00    Pack years: 36.00    Types: Cigarettes  . Smokeless tobacco: Never Used  . Tobacco comment: 5 cigs in one setting  Substance Use Topics  . Alcohol use: Not Currently    Alcohol/week: 3.0 standard drinks    Types: 3 Standard drinks or equivalent per week    Frequency: Never  . Drug use: Not Currently    Types: "Crack" cocaine, Cocaine    Comment: last use >2 years ago     Allergies   Penicillins, Gabapentin, Lyrica [pregabalin], Saphris [asenapine], Tramadol, Adhesive [tape], Latex, and Other   Review of Systems Review of Systems  Constitutional: Positive for appetite change. Negative for chills and fever.  HENT: Negative for congestion.   Respiratory: Positive for shortness of breath. Negative for cough.   Cardiovascular: Positive for chest pain and leg swelling.  Gastrointestinal: Positive for blood in stool, nausea and vomiting. Negative for abdominal pain.  Neurological: Positive for weakness (Generalized). Negative for headaches.  Psychiatric/Behavioral: Negative for agitation.     Physical Exam Updated Vital Signs BP (!) 201/85 (BP Location: Right Arm)   Pulse 99   Temp 98.1 F (36.7 C) (Oral)   Resp 19   LMP  (LMP Unknown)   SpO2 96%   Physical Exam Vitals signs and nursing note reviewed.  Constitutional:  General: She is not in acute distress.    Appearance: She is well-developed.  HENT:     Head: Normocephalic and atraumatic.  Eyes:     Conjunctiva/sclera: Conjunctivae normal.     Pupils: Pupils are equal, round, and reactive to light.  Neck:      Musculoskeletal: Neck supple.  Cardiovascular:     Rate and Rhythm: Normal rate and regular rhythm.     Heart sounds: No murmur.  Pulmonary:     Effort: Pulmonary effort is normal. No respiratory distress.     Breath sounds: Normal breath sounds.  Abdominal:     General: There is distension.     Palpations: Abdomen is soft.     Tenderness: There is no abdominal tenderness. There is no guarding.  Genitourinary:    Comments: External hemorrhoids noted DRE +Melanotic stool  Musculoskeletal:     Right lower leg: Edema present.     Left lower leg: Edema present.  Skin:    General: Skin is warm and dry.  Neurological:     General: No focal deficit present.     Mental Status: She is alert.     Comments: Moves all 4 extremities equally to command      ED Treatments / Results  Labs (all labs ordered are listed, but only abnormal results are displayed) Labs Reviewed  COMPREHENSIVE METABOLIC PANEL - Abnormal; Notable for the following components:      Result Value   Sodium 134 (*)    Potassium >7.5 (*)    Chloride 90 (*)    CO2 17 (*)    Glucose, Bld 221 (*)    BUN 150 (*)    Creatinine, Ser 11.49 (*)    GFR calc non Af Amer 3 (*)    GFR calc Af Amer 4 (*)    Anion gap 27 (*)    All other components within normal limits  CBC - Abnormal; Notable for the following components:   WBC 3.6 (*)    RBC 1.79 (*)    Hemoglobin 6.1 (*)    HCT 19.5 (*)    MCV 108.9 (*)    MCH 34.1 (*)    Platelets 88 (*)    All other components within normal limits  BASIC METABOLIC PANEL - Abnormal; Notable for the following components:   Sodium 134 (*)    Potassium 7.1 (*)    Chloride 97 (*)    CO2 14 (*)    Glucose, Bld 253 (*)    BUN 136 (*)    Creatinine, Ser 10.26 (*)    Calcium 8.0 (*)    GFR calc non Af Amer 4 (*)    GFR calc Af Amer 4 (*)    Anion gap 23 (*)    All other components within normal limits  POC OCCULT BLOOD, ED - Abnormal; Notable for the following components:   Fecal  Occult Bld POSITIVE (*)    All other components within normal limits  I-STAT BETA HCG BLOOD, ED ([facility], [facility], [facility] ONLY) - Abnormal; Notable for the following components:   I-stat hCG, quantitative 8.1 (*)    All other components within normal limits  I-STAT CHEM 8, ED - Abnormal; Notable for the following components:   Sodium 132 (*)    Potassium 7.0 (*)    BUN 131 (*)    Creatinine, Ser 11.40 (*)    Glucose, Bld 239 (*)    Calcium, Ion 0.93 (*)    TCO2 18 (*)  Hemoglobin 5.8 (*)    HCT 17.0 (*)    All other components within normal limits  POCT I-STAT EG7 - Abnormal; Notable for the following components:   pCO2, Ven 32.7 (*)    pO2, Ven 142.0 (*)    Bicarbonate 15.5 (*)    TCO2 16 (*)    Acid-base deficit 10.0 (*)    Sodium 132 (*)    Potassium 6.9 (*)    Calcium, Ion 0.95 (*)    HCT 18.0 (*)    Hemoglobin 6.1 (*)    All other components within normal limits  TROPONIN I (HIGH SENSITIVITY) - Abnormal; Notable for the following components:   Troponin I (High Sensitivity) 72 (*)    All other components within normal limits  TROPONIN I (HIGH SENSITIVITY) - Abnormal; Notable for the following components:   Troponin I (High Sensitivity) 62 (*)    All other components within normal limits  LACTIC ACID, PLASMA  LACTIC ACID, PLASMA  HEMOGLOBIN A1C  I-STAT VENOUS BLOOD GAS, ED  POC SARS CORONAVIRUS 2 AG -  ED  TYPE AND SCREEN  PREPARE RBC (CROSSMATCH)    EKG EKG Interpretation  Date/Time:  Monday July 12 2019 14:14:15 EST Ventricular Rate:  80 PR Interval:  246 QRS Duration: 140 QT Interval:  464 QTC Calculation: 535 R Axis:   64 Text Interpretation: Sinus rhythm with 1st degree A-V block Possible Left atrial enlargement Indeterminate axis Left ventricular hypertrophy with QRS widening ( Cornell product ) Abnormal ECG Confirmed by Quintella Reichert [phone]) on 07/12/2019 3:42:11 PM   Radiology Dg Chest 2 View  Result Date: 07/12/2019 CLINICAL DATA:  Chest pain, missed  dialysis EXAM: CHEST - 2 VIEW COMPARISON:  04/01/2019 FINDINGS: Cardiomegaly. Mild, diffuse interstitial opacity and minimal fissural fluid. The visualized skeletal structures are unremarkable. IMPRESSION: Mild, diffuse interstitial pulmonary opacity and minimal fissural fluid, likely edema in the setting of cardiomegaly and dialysis. No focal airspace opacity. Electronically Signed   By: Eddie Candle M.D.   On: 07/12/2019 14:54    Procedures Procedures (including critical care time)  Medications Ordered in ED Medications  Chlorhexidine Gluconate Cloth 2 % PADS 6 each (has no administration in time range)  sterile water (preservative free) injection (has no administration in time range)  acetaminophen (TYLENOL) tablet 650 mg (has no administration in time range)    Or  acetaminophen (TYLENOL) suppository 650 mg (has no administration in time range)  zolpidem (AMBIEN) tablet 5 mg (has no administration in time range)  sorbitol 70 % solution 30 mL (has no administration in time range)  docusate sodium (ENEMEEZ) enema 283 mg (has no administration in time range)  calcium carbonate (dosed in mg elemental calcium) suspension 500 mg of elemental calcium (has no administration in time range)  feeding supplement (NEPRO CARB STEADY) liquid 237 mL (has no administration in time range)  insulin aspart (novoLOG) injection 0-6 Units (has no administration in time range)  insulin aspart (novoLOG) injection 0-5 Units (has no administration in time range)  pantoprazole (PROTONIX) 80 mg in sodium chloride 0.9 % 100 mL IVPB (has no administration in time range)  pantoprazole (PROTONIX) 80 mg in sodium chloride 0.9 % 250 mL (0.32 mg/mL) infusion (has no administration in time range)  pantoprazole (PROTONIX) injection 40 mg (has no administration in time range)  insulin aspart (novoLOG) injection 5 Units (5 Units Intravenous Given 07/12/19 1803)    And  dextrose 50 % solution 50 mL (50 mLs Intravenous Given  07/12/19 1805)  sodium bicarbonate injection 50 mEq (50 mEq Intravenous Given 07/12/19 1808)  calcium gluconate 1 g/ 50 mL sodium chloride IVPB (0 g Intravenous Stopped 07/13/19 0010)  pantoprazole (PROTONIX) injection 40 mg (40 mg Intravenous Given 07/12/19 1728)  0.9 %  sodium chloride infusion (10 mL/hr Intravenous New Bag/Given 07/12/19 1809)  albuterol (VENTOLIN HFA) 108 (90 Base) MCG/ACT inhaler 8 puff (8 puffs Inhalation Given 07/12/19 1727)     Initial Impression / Assessment and Plan / ED Course  I have reviewed the triage vital signs and the nursing notes.  Pertinent labs & imaging results that were available during my care of the patient were reviewed by me and considered in my medical decision making (see chart for details).        On arrival, patient afebrile, hypertensive, HDS.  Patient is alert and oriented.  Clinically, she is in no acute distress and is on room air.   EKG shows normal sinus rhythm with concern for mildly hyperacute T waves in lateral leads.  Of note, QTC prolonged at 535 Initial troponin 72, will continue to trend, likely demand ischemia 2/2 to combination of fluid overload and anemia as well as poor renal clearance. Low suspicion clinically for AMI.   Triage labs reviewed showing hemoglobin of 6.1 (down from 7.9).  Patient found to be thrombocytopenic (platelets 88 down from 108).  On exam, patient has external hemorrhoids that do not appear to be bleeding.  There is dark melena stool on DRE that is Hemoccult positive.  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  1 unit PRBCs ordered.  Upon chart review, patient had similar presentation in Oct.  She underwent endoscopy without any findings.  Again, patient continues to deny any dark or bloody emesis.  She states she does drink alcohol occasionally but not daily. Bolus of PPI ordered.  CMP concerning for hyperkalemia with potassium  of greater than 7.5.  In setting of EKG findings, patient given 1 g calcium.  Patient also noted to be mildly hyperglycemic at 221 with bicarb of 17 and anion gap of 27.  Patient ordered 5 units insulin and dextrose for temporizing measures.  BUN elevated at 150 in setting of 1 week of missed dialysis as well as concern for GI bleed.  Creatinine 11.5 (baseline appears to be closer to 6). Patient is in no acute respiratory distress and on room air.  Chest x-ray shows mild interstitial edema. Nephrology consulted, pt to be taken urgently to dialysis.  GI consulted with concern for possible upper GI bleed with Hgb drop; agree to see patient tomorrow as inpatient, ok for clear liquid diet.  Patient taken to dialysis. Discussed case with Hospitalist, Dr. Jonelle Sidle for admission; he has assumed care of patient. No further acute events.   Final Clinical Impressions(s) / ED Diagnoses   Final diagnoses:  None    ED Discharge Orders    None       Burns Spain, MD 07/13/19 7628    Carmin Muskrat, MD 07/16/19 1019

## 2019-07-12 NOTE — Consult Note (Signed)
Renal Service Consult Note [facility]  [name] 07/12/2019 Sol Blazing Requesting Physician:  Dr Vanita Panda  Reason for Consult:  ESRD pt w/ GIB and high K+ HPI: The patient is a 53 y.o. year-old with hx of recurrent GIB, ESRD on HD, DM2, TIA, seizures, HTN, hep C, glaucoma, depressoin, chronic pain and anemia presented to ED w/ missed HD and frequent bloody stools.  In ED Hb 6.0 and K+ > 7.5. C/o bilat LE weakness. EKG is w/o acute changes.  Asked to see for ESRD.   Patient missed Wed and Fri HD last week because "I was having too many bloody stools to go."  No SOB, CP or abd pain.    ROS  denies CP  no joint pain   no HA  no blurry vision  no rash  no diarrhea  no nausea/ vomiting    Past Medical History  Past Medical History:  Diagnosis Date  . Anemia   . Anxiety   . Bipolar disorder, unspecified ([place]) 02/06/2014  . Chronic combined systolic and diastolic CHF (congestive heart failure) (HCC)    EF 40% by echo and 48% by stress test  . Chronic pain syndrome   . Depression   . ESRD on hemodialysis (HCC)    TTS  . GERD (gastroesophageal reflux disease)   . Glaucoma   . Headache    sinus headaches  . Hepatitis C   . High cholesterol   . History of hiatal hernia   . History of vitamin D deficiency 06/20/2015  . Hypertension   . MRSA infection ?2014   "on the back of my head; spread throughout my bloodstream"  . Neuropathy   . Refusal of blood transfusions as patient is Jehovah's Witness    occasionally allows transfusions.    . Schizoaffective disorder, unspecified condition 02/08/2014  . Seizure [facility]) dx'd 2014   "don't know what kind; last one was ~ 04/2014"  . TIA (transient ischemic attack) 2010  . Type II diabetes mellitus ([place])    INSULIN DEPENDENT   Past Surgical History  Past Surgical History:  Procedure Laterality Date  . ABDOMINAL HYSTERECTOMY  2014  . ABDOMINAL HYSTERECTOMY  2010  . AMPUTATION Left 05/21/2013   Procedure: Left  Midfoot AMPUTATION;  Surgeon: Newt Minion, MD;  Location: [place];  Service: Orthopedics;  Laterality: Left;  Left Midfoot Amputation  . AV FISTULA PLACEMENT Left 05/04/2015   Procedure: ARTERIOVENOUS (AV) GRAFT INSERTION;  Surgeon: Angelia Mould, MD;  Location: [place];  Service: Vascular;  Laterality: Left;  . BIOPSY  11/18/2018   Procedure: BIOPSY;  Surgeon: Jerene Bears, MD;  Location: [facility] ENDOSCOPY;  Service: Gastroenterology;;  . COLONOSCOPY WITH PROPOFOL N/A 06/22/2013   Procedure: COLONOSCOPY WITH PROPOFOL;  Surgeon: Jeryl Columbia, MD;  Location: [facility] ENDOSCOPY;  Service: Endoscopy;  Laterality: N/A;  . COLONOSCOPY WITH PROPOFOL N/A 11/18/2018   Procedure: COLONOSCOPY WITH PROPOFOL;  Surgeon: Jerene Bears, MD;  Location: [place];  Service: Gastroenterology;  Laterality: N/A;  . ENTEROSCOPY N/A 12/31/2018   Procedure: ENTEROSCOPY;  Surgeon: Irene Shipper, MD;  Location: [facility] ENDOSCOPY;  Service: Endoscopy;  Laterality: N/A;  Push enteroscopy at 8am (spoke with Endo)  . ESOPHAGOGASTRODUODENOSCOPY N/A 05/11/2015   Procedure: ESOPHAGOGASTRODUODENOSCOPY (EGD);  Surgeon: Laurence Spates, MD;  Location: [facility] ENDOSCOPY;  Service: Endoscopy;  Laterality: N/A;  . ESOPHAGOGASTRODUODENOSCOPY (EGD) WITH PROPOFOL N/A 11/18/2018   Procedure: ESOPHAGOGASTRODUODENOSCOPY (EGD) WITH PROPOFOL;  Surgeon: Jerene Bears, MD;  Location: [facility] ENDOSCOPY;  Service: Gastroenterology;  Laterality: N/A;  . ESOPHAGOGASTRODUODENOSCOPY (EGD) WITH PROPOFOL N/A 05/15/2019   Procedure: ESOPHAGOGASTRODUODENOSCOPY (EGD) WITH PROPOFOL;  Surgeon: Jackquline Denmark, MD;  Location: [facility] ENDOSCOPY;  Service: Endoscopy;  Laterality: N/A;  . EYE SURGERY Bilateral 2010   Lasik  . FLEXIBLE SIGMOIDOSCOPY N/A 01/17/2017   Procedure: FLEXIBLE SIGMOIDOSCOPY w/ hemorrhoid banding possible;  Surgeon: Gatha Mayer, MD;  Location: [facility] ENDOSCOPY;  Service: Endoscopy;  Laterality: N/A;  . FLEXIBLE SIGMOIDOSCOPY N/A 01/21/2019   Procedure: FLEXIBLE  SIGMOIDOSCOPY;  Surgeon: Ronald Lobo, MD;  Location: [facility] ENDOSCOPY;  Service: Endoscopy;  Laterality: N/A;  Plan to do the test unprepped, unsedated  . FOOT AMPUTATION Left    due diabetic neuropathy, could not feel ulcer on bottom of foot  . GIVENS CAPSULE STUDY N/A 01/21/2019   Procedure: GIVENS CAPSULE STUDY;  Surgeon: Ronald Lobo, MD;  Location: [facility] ENDOSCOPY;  Service: Endoscopy;  Laterality: N/A;  . POLYPECTOMY  11/18/2018   Procedure: POLYPECTOMY;  Surgeon: Jerene Bears, MD;  Location: [facility] ENDOSCOPY;  Service: Gastroenterology;;  . REFRACTIVE SURGERY Bilateral 2013  . TONSILLECTOMY  1970's  . TUBAL LIGATION  2000   Family History  Family History  Problem Relation Age of Onset  . Hypertension Mother   . Diabetes Mother   . Hypertension Father   . Diabetes Father    Social History  reports that she has been smoking cigarettes. She has a 36.00 pack-year smoking history. She has never used smokeless tobacco. She reports previous alcohol use of about 3.0 standard drinks of alcohol per week. She reports previous drug use. Drugs: "Crack" cocaine and Cocaine. Allergies  Allergies  Allergen Reactions  . Penicillins Hives and Other (See Comments)    Has patient had a PCN reaction causing immediate rash, facial/tongue/throat swelling, SOB or lightheadedness with hypotension: Yes Has patient had a PCN reaction causing severe rash involving mucus membranes or skin necrosis: No Has patient had a PCN reaction that required hospitalization No Has patient had a PCN reaction occurring within the last 10 years: No If all of the above answers are "NO", then may proceed with Cephalosporin use. Pt tolerates cefepime and ceftriaxone  . Gabapentin Itching and Swelling  . Lyrica [Pregabalin] Swelling and Other (See Comments)    Facial and leg swelling  . Saphris [Asenapine] Swelling and Other (See Comments)    Facial swelling  . Tramadol Swelling and Other (See Comments)    Leg swelling  .  Adhesive [Tape] Itching    SEVERE ITCHING  . Latex Itching    SEVERE ITCHING  . Other Other (See Comments)    NO BLOOD PRODUCTS; PATIENT IS A JEHOVAH'S WITNESS   Home medications Prior to Admission medications   Medication Sig Start Date End Date Taking? Authorizing Provider  acetaminophen (TYLENOL) 325 MG tablet Take 2 tablets (650 mg total) by mouth every 6 (six) hours as needed for fever, headache or moderate pain. 02/26/19   Roxan Hockey, MD  albuterol (PROVENTIL) (2.5 MG/3ML) 0.083% nebulizer solution Take 5 mg by nebulization daily as needed for wheezing or shortness of breath.    [provider]  albuterol (VENTOLIN HFA) 108 (90 Base) MCG/ACT inhaler Inhale 2 puffs into the lungs every 4 (four) hours as needed for wheezing or shortness of breath. 01/26/19   Dahal, Marlowe Aschoff, MD  allopurinol (ZYLOPRIM) 100 MG tablet Take 2 tablets (200 mg total) by mouth daily. 02/26/19 04/01/19  Roxan Hockey, MD  ALPRAZolam Duanne Moron) 0.5 MG tablet Take  0.5 mg by mouth 3 (three) times daily as needed for anxiety. 02/16/19   [provider]  amLODipine (NORVASC) 2.5 MG tablet Take 2.5 mg by mouth daily. 03/16/19   [provider]  atorvastatin (LIPITOR) 40 MG tablet Take 1 tablet (40 mg total) by mouth at bedtime. 02/26/19   Roxan Hockey, MD  citalopram (CELEXA) 20 MG tablet  05/21/19   [provider]  Darbepoetin Alfa (ARANESP) 200 MCG/0.4ML SOSY injection Inject 0.4 mLs (200 mcg total) into the vein every Wednesday with hemodialysis. 01/27/19   Terrilee Croak, MD  diphenhydrAMINE (BENADRYL) 25 mg capsule Take 1 capsule (25 mg total) by mouth every 6 (six) hours as needed for up to 8 days for itching. 01/26/19 02/25/28  Dahal, Marlowe Aschoff, MD  furosemide (LASIX) 80 MG tablet Take 80 mg by mouth 2 (two) times daily.    [provider]  hydrocortisone (ANUSOL-HC) 2.5 % rectal cream Place 1 application rectally 2 (two) times daily. 04/03/19   Danford, Suann Larry, MD   hydrOXYzine (ATARAX/VISTARIL) 25 MG tablet Take 1 tablet (25 mg total) by mouth 3 (three) times daily as needed for itching or anxiety. 02/26/19   Roxan Hockey, MD  insulin aspart (NOVOLOG) 100 UNIT/ML injection 0-9 Units, Subcutaneous, 3 times daily with meals CBG < 70: implement hypoglycemia protocol CBG 70 - 120: 0 units CBG 121 - 150: 1 unit CBG 151 - 200: 2 units CBG 201 - 250: 3 units CBG 251 - 300: 5 units CBG 301 - 350: 7 units CBG 351 - 400: 9 units CBG > 400: call MD Patient taking differently: Inject 0-9 Units into the skin 3 (three) times daily with meals. 0-9 Units, Subcutaneous, 3 times daily with meals CBG < 70: implement hypoglycemia protocol CBG 70 - 120: 0 units CBG 121 - 150: 1 unit CBG 151 - 200: 2 units CBG 201 - 250: 3 units CBG 251 - 300: 5 units CBG 301 - 350: 7 units CBG 351 - 400: 9 units CBG > 400: call MD 01/26/19   Terrilee Croak, MD  insulin glargine (LANTUS) 100 UNIT/ML injection Inject 24 Units into the skin at bedtime.    [provider]  lidocaine-prilocaine (EMLA) cream Apply 1 application topically See admin instructions. On dialysis days 12/23/18   [provider]  methocarbamol (ROBAXIN) 500 MG tablet Take 500 mg by mouth daily as needed for muscle spasms.    [provider]  mupirocin ointment (BACTROBAN) 2 % Apply 1 application topically as needed (on arm). 04/03/19   Danford, Suann Larry, MD  NARCAN 4 MG/0.1ML LIQD nasal spray kit  05/01/19   [provider]  Oxycodone HCl 10 MG TABS Take 1 tablet (10 mg total) by mouth 4 (four) times daily. 04/03/19   Danford, Suann Larry, MD  oxymetazoline (AFRIN) 0.05 % nasal spray Place 2 sprays into both nostrils 2 (two) times daily. 01/26/19   Terrilee Croak, MD  pantoprazole (PROTONIX) 40 MG tablet Take 1 tablet (40 mg total) by mouth daily at 6 (six) AM for 30 days. 01/26/19 05/13/19  Terrilee Croak, MD  permethrin (ELIMITE) 5 % cream  05/22/19   [provider]   Pramoxine-Menthol-Petrolatum (SARNA ULTRA EX) Apply 1 application topically daily as needed (itch).    [provider]  senna-docusate (SENOKOT-S) 8.6-50 MG tablet Take 2 tablets by mouth at bedtime. 02/26/19 02/26/20  Roxan Hockey, MD  sulfamethoxazole-trimethoprim (BACTRIM DS) 800-160 MG tablet  05/21/19   [provider]  traZODone (DESYREL)  50 MG tablet Take 0.5 tablets (25 mg total) by mouth at bedtime. 02/26/19 05/13/19  Roxan Hockey, MD   Liver Function Tests Recent Labs  Lab 07/12/19 1438  AST 32  ALT 38  ALKPHOS 78  BILITOT 0.7  PROT 7.3  ALBUMIN 3.6   No results for input(s): LIPASE, AMYLASE in the last 168 hours. CBC Recent Labs  Lab 07/12/19 1438  WBC 3.6*  HGB 6.1*  HCT 19.5*  MCV 108.9*  PLT 88*   Basic Metabolic Panel Recent Labs  Lab 07/12/19 1438  NA 134*  K >7.5*  CL 90*  CO2 17*  GLUCOSE 221*  BUN 150*  CREATININE 11.49*  CALCIUM 9.1   Iron/TIBC/Ferritin/ %Sat    Component Value Date/Time   IRON 95 04/01/2019 2013   TIBC 326 04/01/2019 2013   FERRITIN 804 (H) 04/01/2019 2013   IRONPCTSAT 29 04/01/2019 2013    Vitals:   07/12/19 1410  BP: (!) 185/72  Pulse: 81  Resp: 18  Temp: (!) 97.5 F (36.4 C)  TempSrc: Oral  SpO2: 98%    Exam Gen alert, no distress, lying flat No rash, cyanosis or gangrene Sclera anicteric, throat clear  No jvd or bruits Chest clear bilat to bases no rales RRR no MRG Abd soft ntnd no mass or ascites +bs GU  defer MS no joint effusions or deformity Ext mild LE edema, no wounds or ulcers Neuro is alert, Ox 3 , nf L AVG +bruit    Outpt HD: GO MWF  3.5h  73.5kg  P4  AVG  Hep none  - calc 2.5 ug tiw  - mircera 225 q 2, last ?     Assessment/ Plan: 1. Hyperkalemia - severe, may be symptomatic. Repeat pending. HD orders in for acute HD asap.  Needs rapid covid test, have d/w ED PA.   2. GIB / anemia of ABL - per primary 3. Anemia ckd - cont esa, get records 4. HTN - get vol  down, cont any meds 5. Hep C 6. ESRD - on HD MWF. Plan HD tonight. Missed HD x 2 last week.       Kelly Splinter  MD 07/12/2019, 5:12 PM

## 2019-07-12 NOTE — H&P (Signed)
History and Physical   ARLAYNE LIGGINS KGM:[id number] DOB: 30-Jan-1966 DOA: 07/12/2019  Referring MD/NP/PA: Dr. Vanita Panda  PCP: Rocco Serene, MD   Outpatient Specialists: [facility]  Patient coming from: Home  Chief Complaint: Rectal bleeding  HPI: [name] is a 53 y.o. female with medical history significant of recurrent GI bleed with previous work-up showing no obvious source, end-stage renal disease on hemodialysis Mondays Wednesdays and Fridays, systolic dysfunction CHF with EF of 40%, diabetes, diverticulitis, schizoaffective disorder, bipolar disorder, hyperlipidemia, previous TIAs presenting to the ER ER with dark tarry stools.  Symptoms started about 3 days ago.  Associated with some abdominal cramping.  No nausea or vomiting no hematemesis.  Denied any bright red blood per rectum.  She has history of diverticular disease.  She has also had history of melena in the past with previous EGD as well as colonoscopies.  Patient no state also some chest discomfort and shortness of breath.  She has missed her dialysis until today.  Patient was seen here last month with similar presentation and that was addressed.  At this point she is getting hemodialyzed having missed her previous dialysis session..  ED Course: Temperature 97.5 blood pressure 207/97 pulse 98 respirate of 30 oxygen sat 97% on room air.  White count is 3.6 hemoglobin 6.1 with platelet count of 88 sodium 132 potassium 7.1 chloride 107 CO2 14 BUN 131 creatinine 11.5 calcium 8.0.  Initial COVID-19 screen is negative.  Checks x-ray showed mild diffuse interstitial pulmonary opacity most likely cardiomegaly and fluid overload.  2 units of packed red blood cells have been ordered.  Patient initiated of on IV Protonix.  GI consulted and she has been admitted for evaluation after hemodialysis.  Review of Systems: As per HPI otherwise 10 point review of systems negative.    Past Medical History:  Diagnosis Date  . Anemia   .  Anxiety   . Bipolar disorder, unspecified ([place]) 02/06/2014  . Chronic combined systolic and diastolic CHF (congestive heart failure) (HCC)    EF 40% by echo and 48% by stress test  . Chronic pain syndrome   . Depression   . ESRD on hemodialysis (HCC)    TTS  . GERD (gastroesophageal reflux disease)   . Glaucoma   . Headache    sinus headaches  . Hepatitis C   . High cholesterol   . History of hiatal hernia   . History of vitamin D deficiency 06/20/2015  . Hypertension   . MRSA infection ?2014   "on the back of my head; spread throughout my bloodstream"  . Neuropathy   . Refusal of blood transfusions as patient is Jehovah's Witness    occasionally allows transfusions.    . Schizoaffective disorder, unspecified condition 02/08/2014  . Seizure [facility]) dx'd 2014   "don't know what kind; last one was ~ 04/2014"  . TIA (transient ischemic attack) 2010  . Type II diabetes mellitus ([place])    INSULIN DEPENDENT    Past Surgical History:  Procedure Laterality Date  . ABDOMINAL HYSTERECTOMY  2014  . ABDOMINAL HYSTERECTOMY  2010  . AMPUTATION Left 05/21/2013   Procedure: Left Midfoot AMPUTATION;  Surgeon: Newt Minion, MD;  Location: [place];  Service: Orthopedics;  Laterality: Left;  Left Midfoot Amputation  . AV FISTULA PLACEMENT Left 05/04/2015   Procedure: ARTERIOVENOUS (AV) GRAFT INSERTION;  Surgeon: Angelia Mould, MD;  Location: [place];  Service: Vascular;  Laterality: Left;  . BIOPSY  11/18/2018  Procedure: BIOPSY;  Surgeon: Jerene Bears, MD;  Location: [facility] ENDOSCOPY;  Service: Gastroenterology;;  . COLONOSCOPY WITH PROPOFOL N/A 06/22/2013   Procedure: COLONOSCOPY WITH PROPOFOL;  Surgeon: Jeryl Columbia, MD;  Location: [facility] ENDOSCOPY;  Service: Endoscopy;  Laterality: N/A;  . COLONOSCOPY WITH PROPOFOL N/A 11/18/2018   Procedure: COLONOSCOPY WITH PROPOFOL;  Surgeon: Jerene Bears, MD;  Location: [place];  Service: Gastroenterology;  Laterality: N/A;  . ENTEROSCOPY N/A 12/31/2018    Procedure: ENTEROSCOPY;  Surgeon: Irene Shipper, MD;  Location: [facility] ENDOSCOPY;  Service: Endoscopy;  Laterality: N/A;  Push enteroscopy at 8am (spoke with Endo)  . ESOPHAGOGASTRODUODENOSCOPY N/A 05/11/2015   Procedure: ESOPHAGOGASTRODUODENOSCOPY (EGD);  Surgeon: Laurence Spates, MD;  Location: [facility] ENDOSCOPY;  Service: Endoscopy;  Laterality: N/A;  . ESOPHAGOGASTRODUODENOSCOPY (EGD) WITH PROPOFOL N/A 11/18/2018   Procedure: ESOPHAGOGASTRODUODENOSCOPY (EGD) WITH PROPOFOL;  Surgeon: Jerene Bears, MD;  Location: [facility] ENDOSCOPY;  Service: Gastroenterology;  Laterality: N/A;  . ESOPHAGOGASTRODUODENOSCOPY (EGD) WITH PROPOFOL N/A 05/15/2019   Procedure: ESOPHAGOGASTRODUODENOSCOPY (EGD) WITH PROPOFOL;  Surgeon: Jackquline Denmark, MD;  Location: [facility] ENDOSCOPY;  Service: Endoscopy;  Laterality: N/A;  . EYE SURGERY Bilateral 2010   Lasik  . FLEXIBLE SIGMOIDOSCOPY N/A 01/17/2017   Procedure: FLEXIBLE SIGMOIDOSCOPY w/ hemorrhoid banding possible;  Surgeon: Gatha Mayer, MD;  Location: [facility] ENDOSCOPY;  Service: Endoscopy;  Laterality: N/A;  . FLEXIBLE SIGMOIDOSCOPY N/A 01/21/2019   Procedure: FLEXIBLE SIGMOIDOSCOPY;  Surgeon: Ronald Lobo, MD;  Location: [facility] ENDOSCOPY;  Service: Endoscopy;  Laterality: N/A;  Plan to do the test unprepped, unsedated  . FOOT AMPUTATION Left    due diabetic neuropathy, could not feel ulcer on bottom of foot  . GIVENS CAPSULE STUDY N/A 01/21/2019   Procedure: GIVENS CAPSULE STUDY;  Surgeon: Ronald Lobo, MD;  Location: [facility] ENDOSCOPY;  Service: Endoscopy;  Laterality: N/A;  . POLYPECTOMY  11/18/2018   Procedure: POLYPECTOMY;  Surgeon: Jerene Bears, MD;  Location: [facility] ENDOSCOPY;  Service: Gastroenterology;;  . REFRACTIVE SURGERY Bilateral 2013  . TONSILLECTOMY  1970's  . TUBAL LIGATION  2000     reports that she has been smoking cigarettes. She has a 36.00 pack-year smoking history. She has never used smokeless tobacco. She reports previous alcohol use of about 3.0 standard drinks of  alcohol per week. She reports previous drug use. Drugs: "Crack" cocaine and Cocaine.  Allergies  Allergen Reactions  . Penicillins Hives and Other (See Comments)    Has patient had a PCN reaction causing immediate rash, facial/tongue/throat swelling, SOB or lightheadedness with hypotension: Yes Has patient had a PCN reaction causing severe rash involving mucus membranes or skin necrosis: No Has patient had a PCN reaction that required hospitalization No Has patient had a PCN reaction occurring within the last 10 years: No If all of the above answers are "NO", then may proceed with Cephalosporin use. Pt tolerates cefepime and ceftriaxone  . Gabapentin Itching and Swelling  . Lyrica [Pregabalin] Swelling and Other (See Comments)    Facial and leg swelling  . Saphris [Asenapine] Swelling and Other (See Comments)    Facial swelling  . Tramadol Swelling and Other (See Comments)    Leg swelling  . Adhesive [Tape] Itching    SEVERE ITCHING  . Latex Itching    SEVERE ITCHING  . Other Other (See Comments)    NO BLOOD PRODUCTS; PATIENT IS A JEHOVAH'S WITNESS    Family History  Problem Relation Age of Onset  . Hypertension Mother   . Diabetes Mother   .  Hypertension Father   . Diabetes Father      Prior to Admission medications   Medication Sig Start Date End Date Taking? Authorizing Provider  acetaminophen (TYLENOL) 325 MG tablet Take 2 tablets (650 mg total) by mouth every 6 (six) hours as needed for fever, headache or moderate pain. 02/26/19  Yes Emokpae, Courage, MD  albuterol (PROVENTIL) (2.5 MG/3ML) 0.083% nebulizer solution Take 5 mg by nebulization daily as needed for wheezing or shortness of breath.   Yes [provider]  albuterol (VENTOLIN HFA) 108 (90 Base) MCG/ACT inhaler Inhale 2 puffs into the lungs every 4 (four) hours as needed for wheezing or shortness of breath. 01/26/19  Yes Dahal, Marlowe Aschoff, MD  allopurinol (ZYLOPRIM) 100 MG tablet Take 2 tablets (200 mg total)  by mouth daily. 02/26/19 07/12/19 Yes Emokpae, Courage, MD  ALPRAZolam Duanne Moron) 0.5 MG tablet Take 0.5 mg by mouth 3 (three) times daily as needed for anxiety. 02/16/19  Yes [provider]  amLODipine (NORVASC) 2.5 MG tablet Take 2.5 mg by mouth daily. 03/16/19  Yes [provider]  atorvastatin (LIPITOR) 40 MG tablet Take 1 tablet (40 mg total) by mouth at bedtime. 02/26/19  Yes Emokpae, Courage, MD  citalopram (CELEXA) 20 MG tablet Take 20 mg by mouth daily.  05/21/19  Yes [provider]  Darbepoetin Alfa (ARANESP) 200 MCG/0.4ML SOSY injection Inject 0.4 mLs (200 mcg total) into the vein every Wednesday with hemodialysis. 01/27/19  Yes Dahal, Marlowe Aschoff, MD  diphenhydrAMINE (BENADRYL) 25 mg capsule Take 1 capsule (25 mg total) by mouth every 6 (six) hours as needed for up to 8 days for itching. 01/26/19 02/25/28 Yes Dahal, Marlowe Aschoff, MD  furosemide (LASIX) 80 MG tablet Take 80 mg by mouth 2 (two) times daily.   Yes [provider]  hydrocortisone (ANUSOL-HC) 2.5 % rectal cream Place 1 application rectally 2 (two) times daily. 04/03/19  Yes Danford, Suann Larry, MD  hydrOXYzine (ATARAX/VISTARIL) 25 MG tablet Take 1 tablet (25 mg total) by mouth 3 (three) times daily as needed for itching or anxiety. 02/26/19  Yes Emokpae, Courage, MD  insulin aspart (NOVOLOG) 100 UNIT/ML injection 0-9 Units, Subcutaneous, 3 times daily with meals CBG < 70: implement hypoglycemia protocol CBG 70 - 120: 0 units CBG 121 - 150: 1 unit CBG 151 - 200: 2 units CBG 201 - 250: 3 units CBG 251 - 300: 5 units CBG 301 - 350: 7 units CBG 351 - 400: 9 units CBG > 400: call MD Patient taking differently: Inject 0-9 Units into the skin 3 (three) times daily with meals. 0-9 Units, Subcutaneous, 3 times daily with meals CBG < 70: implement hypoglycemia protocol CBG 70 - 120: 0 units CBG 121 - 150: 1 unit CBG 151 - 200: 2 units CBG 201 - 250: 3 units CBG 251 - 300: 5 units CBG 301 - 350: 7 units CBG 351 - 400: 9 units  CBG > 400: call MD 01/26/19  Yes Dahal, Marlowe Aschoff, MD  insulin glargine (LANTUS) 100 UNIT/ML injection Inject 24 Units into the skin at bedtime.   Yes [provider]  lidocaine-prilocaine (EMLA) cream Apply 1 application topically See admin instructions. On dialysis days 12/23/18  Yes [provider]  methocarbamol (ROBAXIN) 500 MG tablet Take 500 mg by mouth daily as needed for muscle spasms.   Yes [provider]  mupirocin ointment (BACTROBAN) 2 % Apply 1 application topically as needed (on arm). 04/03/19  Yes Danford, Suann Larry, MD  Oxycodone HCl 10 MG  TABS Take 1 tablet (10 mg total) by mouth 4 (four) times daily. 04/03/19  Yes Danford, Suann Larry, MD  oxymetazoline (AFRIN) 0.05 % nasal spray Place 2 sprays into both nostrils 2 (two) times daily. 01/26/19  Yes Dahal, Marlowe Aschoff, MD  Pramoxine-Menthol-Petrolatum (SARNA ULTRA EX) Apply 1 application topically daily as needed (itch).   Yes [provider]  senna-docusate (SENOKOT-S) 8.6-50 MG tablet Take 2 tablets by mouth at bedtime. 02/26/19 02/26/20 Yes Emokpae, Courage, MD  pantoprazole (PROTONIX) 40 MG tablet Take 1 tablet (40 mg total) by mouth daily at 6 (six) AM for 30 days. 01/26/19 05/13/19  Terrilee Croak, MD    Physical Exam: Vitals:   07/12/19 1910 07/12/19 1917 07/12/19 1930 07/12/19 2000  BP: (!) (P) 193/78 (!) (P) 200/82 (!) 194/87 (P) 122/66  Pulse: (P) 79 (P) 79 (P) 85 (P) 77  Resp: (P) 18 (P) 18 15 (P) 15  Temp: (P) 98.2 F (36.8 C) (P) 98.2 F (36.8 C) 98.2 F (36.8 C)   TempSrc: (P) Oral (P) Oral Oral   SpO2: (P) 99%         Constitutional: Acutely ill looking Vitals:   07/12/19 1910 07/12/19 1917 07/12/19 1930 07/12/19 2000  BP: (!) (P) 193/78 (!) (P) 200/82 (!) 194/87 (P) 122/66  Pulse: (P) 79 (P) 79 (P) 85 (P) 77  Resp: (P) 18 (P) 18 15 (P) 15  Temp: (P) 98.2 F (36.8 C) (P) 98.2 F (36.8 C) 98.2 F (36.8 C)   TempSrc: (P) Oral (P) Oral Oral   SpO2: (P) 99%      Eyes:  PERRL, lids and conjunctivae normal, puffy eyes ENMT: Mucous membranes are moist. Posterior pharynx clear of any exudate or lesions.Normal dentition.  Neck: normal, supple, no masses, no thyromegaly Respiratory: clear to auscultation bilaterally, no wheezing, no crackles. Normal respiratory effort. No accessory muscle use.  Cardiovascular: Regular rate and rhythm, no murmurs / rubs / gallops. No extremity edema. 2+ pedal pulses. No carotid bruits.  Abdomen: Distended, tympanic check, no tenderness, no masses palpated. No hepatosplenomegaly. Bowel sounds positive.  Musculoskeletal: no clubbing / cyanosis. No joint deformity upper and lower extremities. Good ROM, no contractures. Normal muscle tone.  Skin: no rashes, lesions, ulcers. No induration Neurologic: CN 2-12 grossly intact. Sensation intact, DTR normal. Strength 5/5 in all 4.  Psychiatric: Normal judgment and insight. Alert and oriented x 3.  Anxious    Labs on Admission: I have personally reviewed following labs and imaging studies  CBC: Recent Labs  Lab 07/12/19 1438 07/12/19 1808 07/12/19 1809  WBC 3.6*  --   --   HGB 6.1* 5.8* 6.1*  HCT 19.5* 17.0* 18.0*  MCV 108.9*  --   --   PLT 88*  --   --    Basic Metabolic Panel: Recent Labs  Lab 07/12/19 1438 07/12/19 1753 07/12/19 1808 07/12/19 1809  NA 134* 134* 132* 132*  K >7.5* 7.1* 7.0* 6.9*  CL 90* 97* 105  --   CO2 17* 14*  --   --   GLUCOSE 221* 253* 239*  --   BUN 150* 136* 131*  --   CREATININE 11.49* 10.26* 11.40*  --   CALCIUM 9.1 8.0*  --   --    GFR: CrCl cannot be calculated (Unknown ideal weight.). Liver Function Tests: Recent Labs  Lab 07/12/19 1438  AST 32  ALT 38  ALKPHOS 78  BILITOT 0.7  PROT 7.3  ALBUMIN 3.6   No results for input(s): LIPASE,  AMYLASE in the last 168 hours. No results for input(s): AMMONIA in the last 168 hours. Coagulation Profile: No results for input(s): INR, PROTIME in the last 168 hours. Cardiac Enzymes: No  results for input(s): CKTOTAL, CKMB, CKMBINDEX, TROPONINI in the last 168 hours. BNP (last 3 results) No results for input(s): PROBNP in the last 8760 hours. HbA1C: No results for input(s): HGBA1C in the last 72 hours. CBG: No results for input(s): GLUCAP in the last 168 hours. Lipid Profile: No results for input(s): CHOL, HDL, LDLCALC, TRIG, CHOLHDL, LDLDIRECT in the last 72 hours. Thyroid Function Tests: No results for input(s): TSH, T4TOTAL, FREET4, T3FREE, THYROIDAB in the last 72 hours. Anemia Panel: No results for input(s): VITAMINB12, FOLATE, FERRITIN, TIBC, IRON, RETICCTPCT in the last 72 hours. Urine analysis:    Component Value Date/Time   COLORURINE YELLOW 02/25/2017 1224   APPEARANCEUR HAZY (A) 02/25/2017 1224   LABSPEC 1.013 02/25/2017 1224   PHURINE 8.0 02/25/2017 1224   GLUCOSEU >=500 (A) 02/25/2017 1224   HGBUR SMALL (A) 02/25/2017 1224   BILIRUBINUR NEGATIVE 02/25/2017 1224   BILIRUBINUR neg 12/13/2014 1127   KETONESUR NEGATIVE 02/25/2017 1224   PROTEINUR >=300 (A) 02/25/2017 1224   UROBILINOGEN 0.2 05/01/2015 1252   NITRITE NEGATIVE 02/25/2017 1224   LEUKOCYTESUR NEGATIVE 02/25/2017 1224   Sepsis Labs: @LABRCNTIP (procalcitonin:4,lacticidven:4) )No results found for this or any previous visit (from the past 240 hour(s)).   Radiological Exams on Admission: Dg Chest 2 View  Result Date: 07/12/2019 CLINICAL DATA:  Chest pain, missed dialysis EXAM: CHEST - 2 VIEW COMPARISON:  04/01/2019 FINDINGS: Cardiomegaly. Mild, diffuse interstitial opacity and minimal fissural fluid. The visualized skeletal structures are unremarkable. IMPRESSION: Mild, diffuse interstitial pulmonary opacity and minimal fissural fluid, likely edema in the setting of cardiomegaly and dialysis. No focal airspace opacity. Electronically Signed   By: Eddie Candle M.D.   On: 07/12/2019 14:54    Assessment/Plan Principal Problem:   Melena Active Problems:   Seizure disorder (HCC)    Hyponatremia   Chronic combined systolic (congestive) and diastolic (congestive) heart failure (HCC)   Essential hypertension   Hyperkalemia   ESRD (end stage renal disease) (HCC)   Volume overload   Type II diabetes mellitus with renal manifestations (HCC)   GERD (gastroesophageal reflux disease)   Tobacco abuse     #1 melena: Patient active GI bleed could be from her abdomen diverticulosis but most likely upper GI bleed.  Initiate Protonix drip.  Serial CBC.  GI consult for possible repeat EGD and or colonoscopy.  Continue monitor on telemetry bed.  #2 end-stage renal disease: Patient being dialyzed today.  Will defer to nephrology  #3 hyperkalemia: Secondary to end-stage renal disease.  Continue per nephrology  #4 GERD: Continue with PPIs.  #5 tobacco abuse: Continues to work cessation counseling  #6 seizure disorder: Continue home regimen  #7 history of CHF: Chronic combined diastolic and systolic.  We will be careful especially with blood transfusions.  #8 essential hypertension: Blood pressure is elevated now.  Resume home regimen.   DVT prophylaxis: Heparin Code Status: Full code Family Communication: No family at bedside Disposition Plan: Home Consults called: Gastroenterology Dr. Watt Climes Admission status: Inpatient  Severity of Illness: The appropriate patient status for this patient is INPATIENT. Inpatient status is judged to be reasonable and necessary in order to provide the required intensity of service to ensure the patient's safety. The patient's presenting symptoms, physical exam findings, and initial radiographic and laboratory data in the context of their chronic  comorbidities is felt to place them at high risk for further clinical deterioration. Furthermore, it is not anticipated that the patient will be medically stable for discharge from the hospital within 2 midnights of admission. The following factors support the patient status of inpatient.   " The  patient's presenting symptoms include melena. " The worrisome physical exam findings include acutely ill looking. " The initial radiographic and laboratory data are worrisome because of evidence of end-stage renal disease. " The chronic co-morbidities include end-stage renal disease.   * I certify that at the point of admission it is my clinical judgment that the patient will require inpatient hospital care spanning beyond 2 midnights from the point of admission due to high intensity of service, high risk for further deterioration and high frequency of surveillance required.Barbette Merino MD [facility] Pager [phone]  If 7PM-7AM, please contact night-coverage [email or web address] Password [facility]  07/12/2019, 8:56 PM

## 2019-07-12 NOTE — ED Notes (Signed)
Blood consent obtained and at bed side. Copy sent to blood bank for verification

## 2019-07-12 NOTE — Progress Notes (Signed)
Pt received 1 unit of PRBCs; tolerated HD tx well; no s/s of a rxn noted.

## 2019-07-13 ENCOUNTER — Inpatient Hospital Stay (HOSPITAL_COMMUNITY): Payer: Medicare Other

## 2019-07-13 ENCOUNTER — Encounter (HOSPITAL_COMMUNITY): Payer: Self-pay

## 2019-07-13 ENCOUNTER — Other Ambulatory Visit: Payer: Self-pay

## 2019-07-13 ENCOUNTER — Encounter (HOSPITAL_COMMUNITY): Admission: RE | Admit: 2019-07-13 | Payer: Medicare Other | Source: Ambulatory Visit

## 2019-07-13 DIAGNOSIS — I5042 Chronic combined systolic (congestive) and diastolic (congestive) heart failure: Secondary | ICD-10-CM

## 2019-07-13 DIAGNOSIS — E875 Hyperkalemia: Secondary | ICD-10-CM

## 2019-07-13 DIAGNOSIS — E1122 Type 2 diabetes mellitus with diabetic chronic kidney disease: Secondary | ICD-10-CM

## 2019-07-13 DIAGNOSIS — K219 Gastro-esophageal reflux disease without esophagitis: Secondary | ICD-10-CM

## 2019-07-13 DIAGNOSIS — Z794 Long term (current) use of insulin: Secondary | ICD-10-CM

## 2019-07-13 DIAGNOSIS — E8770 Fluid overload, unspecified: Secondary | ICD-10-CM

## 2019-07-13 DIAGNOSIS — I10 Essential (primary) hypertension: Secondary | ICD-10-CM

## 2019-07-13 DIAGNOSIS — N186 End stage renal disease: Secondary | ICD-10-CM

## 2019-07-13 DIAGNOSIS — Z992 Dependence on renal dialysis: Secondary | ICD-10-CM

## 2019-07-13 DIAGNOSIS — E871 Hypo-osmolality and hyponatremia: Secondary | ICD-10-CM

## 2019-07-13 DIAGNOSIS — G40909 Epilepsy, unspecified, not intractable, without status epilepticus: Secondary | ICD-10-CM

## 2019-07-13 DIAGNOSIS — Z720 Tobacco use: Secondary | ICD-10-CM

## 2019-07-13 HISTORY — PX: IR ANGIOGRAM VISCERAL SELECTIVE: IMG657

## 2019-07-13 HISTORY — PX: IR EMBO ART  VEN HEMORR LYMPH EXTRAV  INC GUIDE ROADMAPPING: IMG5450

## 2019-07-13 HISTORY — PX: IR ANGIOGRAM SELECTIVE EACH ADDITIONAL VESSEL: IMG667

## 2019-07-13 LAB — CBC
HCT: 20.4 % — ABNORMAL LOW (ref 36.0–46.0)
Hemoglobin: 6.4 g/dL — CL (ref 12.0–15.0)
MCH: 33.5 pg (ref 26.0–34.0)
MCHC: 31.4 g/dL (ref 30.0–36.0)
MCV: 106.8 fL — ABNORMAL HIGH (ref 80.0–100.0)
Platelets: 75 10*3/uL — ABNORMAL LOW (ref 150–400)
RBC: 1.91 MIL/uL — ABNORMAL LOW (ref 3.87–5.11)
RDW: 17.1 % — ABNORMAL HIGH (ref 11.5–15.5)
WBC: 2.9 10*3/uL — ABNORMAL LOW (ref 4.0–10.5)
nRBC: 0 % (ref 0.0–0.2)

## 2019-07-13 LAB — CBG MONITORING, ED
Glucose-Capillary: 64 mg/dL — ABNORMAL LOW (ref 70–99)
Glucose-Capillary: 165 mg/dL — ABNORMAL HIGH (ref 70–99)
Glucose-Capillary: 78 mg/dL (ref 70–99)
Glucose-Capillary: 93 mg/dL (ref 70–99)
Glucose-Capillary: 97 mg/dL (ref 70–99)

## 2019-07-13 LAB — PREPARE RBC (CROSSMATCH)

## 2019-07-13 LAB — HEMOGLOBIN A1C
Hgb A1c MFr Bld: 5.9 % — ABNORMAL HIGH (ref 4.8–5.6)
Mean Plasma Glucose: 122.63 mg/dL

## 2019-07-13 LAB — GLUCOSE, CAPILLARY: Glucose-Capillary: 100 mg/dL — ABNORMAL HIGH (ref 70–99)

## 2019-07-13 MED ORDER — IOHEXOL 300 MG/ML  SOLN
150.0000 mL | Freq: Once | INTRAMUSCULAR | Status: AC | PRN
  Administered 2019-07-13: 30 mL via INTRA_ARTERIAL

## 2019-07-13 MED ORDER — CITALOPRAM HYDROBROMIDE 10 MG PO TABS: 20.0000 mg | ORAL_TABLET | Freq: Every day | ORAL | Status: DC

## 2019-07-13 MED ORDER — SODIUM CHLORIDE 0.9 % IV SOLN
INTRAVENOUS | Status: DC
  Administered 2019-07-13: via INTRAVENOUS

## 2019-07-13 MED ORDER — SODIUM CHLORIDE 0.9 % IV SOLN
8.0000 mg/h | INTRAVENOUS | Status: DC
  Administered 2019-07-13 – 2019-07-14 (×3): 8 mg/h via INTRAVENOUS
  Filled 2019-07-13 (×3): qty 80

## 2019-07-13 MED ORDER — INSULIN ASPART 100 UNIT/ML ~~LOC~~ SOLN
0.0000 [IU] | Freq: Three times a day (TID) | SUBCUTANEOUS | Status: DC
  Administered 2019-07-14: 3 [IU] via SUBCUTANEOUS
  Administered 2019-07-15: 1 [IU] via SUBCUTANEOUS
  Administered 2019-07-15: 3 [IU] via SUBCUTANEOUS
  Administered 2019-07-15 – 2019-07-16 (×2): 1 [IU] via SUBCUTANEOUS

## 2019-07-13 MED ORDER — MIDAZOLAM HCL 2 MG/2ML IJ SOLN
INTRAMUSCULAR | Status: DC | PRN
  Administered 2019-07-13: 1 mg via INTRAVENOUS

## 2019-07-13 MED ORDER — FENTANYL CITRATE (PF) 100 MCG/2ML IJ SOLN
INTRAMUSCULAR | Status: DC | PRN
  Administered 2019-07-13: 50 ug via INTRAVENOUS

## 2019-07-13 MED ORDER — PANTOPRAZOLE SODIUM 40 MG IV SOLR: 40.0000 mg | Freq: Two times a day (BID) | INTRAVENOUS | Status: DC

## 2019-07-13 MED ORDER — AMLODIPINE BESYLATE 5 MG PO TABS
2.5000 mg | ORAL_TABLET | Freq: Every day | ORAL | Status: DC
  Administered 2019-07-13 – 2019-07-14 (×2): 2.5 mg via ORAL
  Filled 2019-07-13 (×2): qty 1

## 2019-07-13 MED ORDER — DEXTROSE 50 % IV SOLN
1.0000 | INTRAVENOUS | Status: DC | PRN
  Administered 2019-07-13: 50 mL via INTRAVENOUS
  Filled 2019-07-13: qty 50

## 2019-07-13 MED ORDER — INSULIN ASPART 100 UNIT/ML ~~LOC~~ SOLN
0.0000 [IU] | Freq: Every day | SUBCUTANEOUS | Status: DC
  Administered 2019-07-15: 2 [IU] via SUBCUTANEOUS

## 2019-07-13 MED ORDER — ACETAMINOPHEN 325 MG PO TABS
650.0000 mg | ORAL_TABLET | Freq: Four times a day (QID) | ORAL | Status: DC | PRN
  Administered 2019-07-13 – 2019-07-16 (×5): 650 mg via ORAL
  Filled 2019-07-13 (×6): qty 2

## 2019-07-13 MED ORDER — CALCIUM CARBONATE ANTACID 1250 MG/5ML PO SUSP
500.0000 mg | Freq: Four times a day (QID) | ORAL | Status: DC | PRN
  Filled 2019-07-13: qty 5

## 2019-07-13 MED ORDER — IOHEXOL 300 MG/ML  SOLN
100.0000 mL | Freq: Once | INTRAMUSCULAR | Status: AC | PRN
  Administered 2019-07-13: 50 mL via INTRA_ARTERIAL

## 2019-07-13 MED ORDER — NEPRO/CARBSTEADY PO LIQD
237.0000 mL | Freq: Three times a day (TID) | ORAL | Status: DC | PRN
  Filled 2019-07-13: qty 237

## 2019-07-13 MED ORDER — ZOLPIDEM TARTRATE 5 MG PO TABS: 5.0000 mg | ORAL_TABLET | Freq: Every evening | ORAL | Status: DC | PRN

## 2019-07-13 MED ORDER — ALPRAZOLAM 0.5 MG PO TABS
0.5000 mg | ORAL_TABLET | Freq: Three times a day (TID) | ORAL | Status: DC | PRN
  Administered 2019-07-13: 0.5 mg via ORAL
  Filled 2019-07-13: qty 2

## 2019-07-13 MED ORDER — ALLOPURINOL 100 MG PO TABS
200.0000 mg | ORAL_TABLET | Freq: Every day | ORAL | Status: DC
  Administered 2019-07-13 – 2019-07-16 (×4): 200 mg via ORAL
  Filled 2019-07-13 (×4): qty 2

## 2019-07-13 MED ORDER — LIDOCAINE HCL 1 % IJ SOLN
INTRAMUSCULAR | Status: AC
  Filled 2019-07-13: qty 20

## 2019-07-13 MED ORDER — SODIUM CHLORIDE 0.9 % IV SOLN
80.0000 mg | Freq: Once | INTRAVENOUS | Status: AC
  Administered 2019-07-13: 80 mg via INTRAVENOUS
  Filled 2019-07-13: qty 80

## 2019-07-13 MED ORDER — TECHNETIUM TC 99M-LABELED RED BLOOD CELLS IV KIT: 25.0000 | PACK | Freq: Once | INTRAVENOUS | Status: DC | PRN

## 2019-07-13 MED ORDER — FUROSEMIDE 80 MG PO TABS
80.0000 mg | ORAL_TABLET | Freq: Two times a day (BID) | ORAL | Status: DC
  Administered 2019-07-13 – 2019-07-16 (×6): 80 mg via ORAL
  Filled 2019-07-13 (×3): qty 1
  Filled 2019-07-13: qty 4
  Filled 2019-07-13 (×2): qty 1

## 2019-07-13 MED ORDER — OXYCODONE HCL 5 MG PO TABS
15.0000 mg | ORAL_TABLET | Freq: Two times a day (BID) | ORAL | Status: DC | PRN
  Administered 2019-07-13 – 2019-07-15 (×3): 15 mg via ORAL
  Filled 2019-07-13 (×3): qty 3

## 2019-07-13 MED ORDER — CHLORHEXIDINE GLUCONATE CLOTH 2 % EX PADS
6.0000 | MEDICATED_PAD | Freq: Every day | CUTANEOUS | Status: DC
  Administered 2019-07-14: 6 via TOPICAL

## 2019-07-13 MED ORDER — FENTANYL CITRATE (PF) 100 MCG/2ML IJ SOLN
INTRAMUSCULAR | Status: AC
  Filled 2019-07-13: qty 2

## 2019-07-13 MED ORDER — SORBITOL 70 % SOLN
30.0000 mL | Status: DC | PRN
  Filled 2019-07-13: qty 30

## 2019-07-13 MED ORDER — ALBUTEROL SULFATE (2.5 MG/3ML) 0.083% IN NEBU: 2.5000 mg | INHALATION_SOLUTION | RESPIRATORY_TRACT | Status: DC | PRN

## 2019-07-13 MED ORDER — SODIUM CHLORIDE 0.9% IV SOLUTION
Freq: Once | INTRAVENOUS | Status: AC
  Administered 2019-07-13: 17:00:00 via INTRAVENOUS

## 2019-07-13 MED ORDER — DOCUSATE SODIUM 283 MG RE ENEM
1.0000 | ENEMA | RECTAL | Status: DC | PRN
  Filled 2019-07-13: qty 1

## 2019-07-13 MED ORDER — ACETAMINOPHEN 650 MG RE SUPP: 650.0000 mg | Freq: Four times a day (QID) | RECTAL | Status: DC | PRN

## 2019-07-13 MED ORDER — ALBUTEROL SULFATE HFA 108 (90 BASE) MCG/ACT IN AERS
2.0000 | INHALATION_SPRAY | RESPIRATORY_TRACT | Status: DC | PRN
  Filled 2019-07-13: qty 6.7

## 2019-07-13 MED ORDER — MIDAZOLAM HCL 2 MG/2ML IJ SOLN
INTRAMUSCULAR | Status: AC
  Filled 2019-07-13: qty 2

## 2019-07-13 NOTE — Progress Notes (Signed)
Tagged RBC scan results noted this afternoon showing POSITIVE bleeding in what appears to be the right colon. I called Dr. Alessandra Bevels who recommended angiography by IR. I got in contact with IR, Dr. Annamaria Boots after he completed an emergent procedure. He will consult on the patient, though his first thoughts were that a CT angio may be the better next test. I appreciate his care of this patient.  [name], MD 07/13/2019 7:32 PM

## 2019-07-13 NOTE — ED Notes (Signed)
Pt refusing to wear cardiac monitor, bp cuff or pulse ox.

## 2019-07-13 NOTE — Sedation Documentation (Signed)
5Fr sheath removed for RIGHT femoral artery by Melchor Amour, ,RTR. Hemostasis achieved with exoseal closure device. Groin level 0, 3+RDP, 1+RPT.

## 2019-07-13 NOTE — Progress Notes (Signed)
PROGRESS NOTE  [name]  QMG:[id number] DOB: April 22, 1966 DOA: 07/12/2019 PCP: Rocco Serene, MD   Brief Narrative: [name] is a 53 y.o. female with a history of ESRD, T2DM, combined CHF, bipolar and schizoaffective disorder, and recurrent GI bleed with multiple negative work ups in the past who presented with dark stools, nausea and vomiting for 4 days on 12/7 which caused missed HD x2. She was hemodynamically stable with +FOBT melena and external hemorrhoids on exam. Metabolic panel grossly abnormal with potassium >7.5. Hgb noted to be 6.1, platelets 88k, WBC 3.6k. CXR with interstitial edema. 1u PRBCs transfused after confirming patient's consent (Jehovah's Witness), PPI bolused, GI consulted and nephrology consulted. Emergent HD was performed with improvement in metabolic parameters, normalization of potassium. She continues to have GI bleeding, tagged scan ordered by GI.  Of note, she reports to me that she's had chronic pain in her legs for which she's been receiving oxycodone regularly. This seemed to have worsened over the past month, so she began taking ibuprofen regularly.  Assessment & Plan: Principal Problem:   Melena Active Problems:   Seizure disorder (HCC)   Hyponatremia   Chronic combined systolic (congestive) and diastolic (congestive) heart failure (HCC)   Essential hypertension   Hyperkalemia   ESRD (end stage renal disease) (HCC)   Volume overload   Type II diabetes mellitus with renal manifestations (HCC)   GERD (gastroesophageal reflux disease)   Tobacco abuse  Acute blood loss anemia on anemia of chronic kidney disease due to recurrent cryptogenic GI bleeding: With multiple negative work ups in the past. Macrocytic indices noted in setting of cirrhosis with consistently checked folate and B12 in the past has not been deficient. - Concern for NSAID-induced gastritis given her recet history of ibuprofen use. - GI consulted, planned repeat bleeding scan since  bleeding seems to be ongoing. Considering repeat colonoscopy and endoscopy based on results. - Given 1u PRBCs with hgb only 6.1 > 6.4g/dl and still some bleeding. Hemodynamics are stable. Give 2u PRBCs more and monitor H/H serially. Requires progressive care bed. - PPI gtt - Check INR. Bound to be less useful since receiving transfusions. - Continue ESA per nephrology - Avoid NSAIDs.  ESRD: HD MWF. Complicated by hyperkalemia in the setting of missing HD x2 PTA. - Nephrology consulted. Received HD 12/7. Hyperkalemia resolved.  QT prolongation, peaked T waves: In setting of hyperkalemia which has resolved.  - Continue telemetry.   Chronic combined HFrEF: LVEF 40%. - Monitor volume, manage w/HD. Suspect transfusions will not overload her in the setting of ongoing GI bleeding.  Troponin elevation: Demand ischemia, mild, not worsening.  - Consider further work up based on clinical progress.  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 Reportedly had negative esophageal varices screening. - Noted.  - Monitor platelet count.   HLD:  - Hold statin for now given Dx cirrhosis.   IDT2DM: Well controlled with HbA1c 5.9% (not clear whether this was before or after transfusion. - Give SSI, holding lantus given low-normal CBGs and unpredictable po status.  Tobacco use:  - Cessation counseling provided  Schizoaffective disorder, bipolar disorder: Mood appears stable currently. No psychosis noted.  - Continue SSRI and xanax (confirmed dose in PDMP)  Chronic pain:  - Continue oxycodone (confirmed consistent Rx in PDMP)  Jehovah's Witness: I confirmed that the patient consents to blood products at this  time as deemed necessary to preserve her life.  DVT prophylaxis: SCDs Code Status: Full Family Communication: None at bedside Disposition Plan: Uncertain   Consultants:   [facility]  Procedures:   None  Antimicrobials:  None   Subjective: Still having intermittent crampy abdominal pain and a single bloody, malodorous stool this morning.   Objective: Vitals:   07/13/19 0830 07/13/19 0900 07/13/19 0930 07/13/19 1000  BP: (!) 184/79 (!) 176/75 (!) 182/83 (!) 161/73  Pulse: 90 90 89   Resp: (!) 8 17 16 15   Temp:      TempSrc:      SpO2: 92% 90% 94%     Intake/Output Summary (Last 24 hours) at 07/13/2019 1351 Last data filed at 07/13/2019 0239 Gross per 24 hour  Intake 415 ml  Output 3000 ml  Net -2585 ml   Filed Weights    Gen: 53 y.o. female in no distress Pulm: Non-labored breathing room air. Clear to auscultation bilaterally.  CV: Regular rate and rhythm. No murmur, rub, or gallop. No JVD, no pedal edema. GI: Abdomen soft, minimally tender throughout, non-distended, with normoactive bowel sounds. No organomegaly or masses felt. Ext: Warm, no deformities Skin: No rashes, lesions or ulcers Neuro: Alert and oriented. No focal neurological deficits. Psych: Judgement and insight appear normal. Mood & affect appropriate.   Data Reviewed: I have personally reviewed following labs and imaging studies  CBC: Recent Labs  Lab 07/12/19 1438 07/12/19 1808 07/12/19 1809 07/13/19 1140  WBC 3.6*  --   --  2.9*  HGB 6.1* 5.8* 6.1* 6.4*  HCT 19.5* 17.0* 18.0* 20.4*  MCV 108.9*  --   --  106.8*  PLT 88*  --   --  75*   Basic Metabolic Panel: Recent Labs  Lab 07/12/19 1438 07/12/19 1753 07/12/19 1808 07/12/19 1809 07/13/19 1140  NA 134* 134* 132* 132* 136  K >7.5* 7.1* 7.0* 6.9* 4.0  CL 90* 97* 105  --  94*  CO2 17* 14*  --   --  25  GLUCOSE 221* 253* 239*  --  100*  BUN 150* 136* 131*  --  49*  CREATININE 11.49* 10.26* 11.40*  --  5.29*  CALCIUM 9.1 8.0*  --   --  8.2*   GFR: CrCl cannot be calculated (Unknown ideal weight.). Liver Function Tests: Recent Labs  Lab 07/12/19 1438  AST 32  ALT 38  ALKPHOS 78   BILITOT 0.7  PROT 7.3  ALBUMIN 3.6   No results for input(s): LIPASE, AMYLASE in the last 168 hours. No results for input(s): AMMONIA in the last 168 hours. Coagulation Profile: No results for input(s): INR, PROTIME in the last 168 hours. Cardiac Enzymes: No results for input(s): CKTOTAL, CKMB, CKMBINDEX, TROPONINI in the last 168 hours. BNP (last 3 results) No results for input(s): PROBNP in the last 8760 hours. HbA1C: Recent Labs    07/13/19 0205  HGBA1C 5.9*   CBG: Recent Labs  Lab 07/13/19 0202 07/13/19 0303 07/13/19 0749 07/13/19 1223  GLUCAP 64* 165* 78 93   Lipid Profile: No results for input(s): CHOL, HDL, LDLCALC, TRIG, CHOLHDL, LDLDIRECT in the last 72 hours. Thyroid Function Tests: No results for input(s): TSH, T4TOTAL, FREET4, T3FREE, THYROIDAB in the last 72 hours. Anemia Panel: No results for input(s): VITAMINB12, FOLATE, FERRITIN, TIBC, IRON, RETICCTPCT in the last 72 hours. Urine analysis:    Component Value Date/Time   COLORURINE YELLOW 02/25/2017 1224   APPEARANCEUR HAZY (A) 02/25/2017 1224   LABSPEC 1.013  02/25/2017 1224   PHURINE 8.0 02/25/2017 1224   GLUCOSEU >=500 (A) 02/25/2017 1224   HGBUR SMALL (A) 02/25/2017 1224   BILIRUBINUR NEGATIVE 02/25/2017 1224   BILIRUBINUR neg 12/13/2014 1127   [place] 02/25/2017 1224   PROTEINUR >=300 (A) 02/25/2017 1224   UROBILINOGEN 0.2 05/01/2015 1252   NITRITE NEGATIVE 02/25/2017 1224   LEUKOCYTESUR NEGATIVE 02/25/2017 1224   No results found for this or any previous visit (from the past 240 hour(s)).    Radiology Studies: Dg Chest 2 View  Result Date: 07/12/2019 CLINICAL DATA:  Chest pain, missed dialysis EXAM: CHEST - 2 VIEW COMPARISON:  04/01/2019 FINDINGS: Cardiomegaly. Mild, diffuse interstitial opacity and minimal fissural fluid. The visualized skeletal structures are unremarkable. IMPRESSION: Mild, diffuse interstitial pulmonary opacity and minimal fissural fluid, likely edema in the  setting of cardiomegaly and dialysis. No focal airspace opacity. Electronically Signed   By: Eddie Candle M.D.   On: 07/12/2019 14:54    Scheduled Meds: . Chlorhexidine Gluconate Cloth  6 each Topical Q0600  . insulin aspart  0-5 Units Subcutaneous QHS  . insulin aspart  0-6 Units Subcutaneous TID WC  . [START ON 07/16/2019] pantoprazole  40 mg Intravenous Q12H   Continuous Infusions: . pantoprozole (PROTONIX) infusion 8 mg/hr (07/13/19 0246)     LOS: 1 day   Time spent: 35 minutes.  Patrecia Pour, MD [facility] [email or web address] 07/13/2019, 1:51 PM

## 2019-07-13 NOTE — Consult Note (Signed)
Chief Complaint:  Acute lower GI bleed   Referring Physician(s): Grunz   History of Present Illness: [name] is a 53 y.o. female with ESRD on HD, with acute lower GI bleed requiring transfusions.  In the Ed, just past another bloody BM.  Getting fld and blood now.  Although she is HD stable, she appears to still be bleeding.  NM BLeeding scan positive at 5pm for acute Rt colon bleed.  Past Medical History:  Diagnosis Date  . Anemia   . Anxiety   . Bipolar disorder, unspecified ([place]) 02/06/2014  . Chronic combined systolic and diastolic CHF (congestive heart failure) (HCC)    EF 40% by echo and 48% by stress test  . Chronic pain syndrome   . Depression   . ESRD on hemodialysis (HCC)    TTS  . GERD (gastroesophageal reflux disease)   . Glaucoma   . Headache    sinus headaches  . Hepatitis C   . High cholesterol   . History of hiatal hernia   . History of vitamin D deficiency 06/20/2015  . Hypertension   . MRSA infection ?2014   "on the back of my head; spread throughout my bloodstream"  . Neuropathy   . Refusal of blood transfusions as patient is Jehovah's Witness    occasionally allows transfusions.    . Schizoaffective disorder, unspecified condition 02/08/2014  . Seizure [facility]) dx'd 2014   "don't know what kind; last one was ~ 04/2014"  . TIA (transient ischemic attack) 2010  . Type II diabetes mellitus ([place])    INSULIN DEPENDENT    Past Surgical History:  Procedure Laterality Date  . ABDOMINAL HYSTERECTOMY  2014  . ABDOMINAL HYSTERECTOMY  2010  . AMPUTATION Left 05/21/2013   Procedure: Left Midfoot AMPUTATION;  Surgeon: Newt Minion, MD;  Location: [place];  Service: Orthopedics;  Laterality: Left;  Left Midfoot Amputation  . AV FISTULA PLACEMENT Left 05/04/2015   Procedure: ARTERIOVENOUS (AV) GRAFT INSERTION;  Surgeon: Angelia Mould, MD;  Location: [place];  Service: Vascular;  Laterality: Left;  . BIOPSY  11/18/2018   Procedure: BIOPSY;  Surgeon:  Jerene Bears, MD;  Location: [facility] ENDOSCOPY;  Service: Gastroenterology;;  . COLONOSCOPY WITH PROPOFOL N/A 06/22/2013   Procedure: COLONOSCOPY WITH PROPOFOL;  Surgeon: Jeryl Columbia, MD;  Location: [facility] ENDOSCOPY;  Service: Endoscopy;  Laterality: N/A;  . COLONOSCOPY WITH PROPOFOL N/A 11/18/2018   Procedure: COLONOSCOPY WITH PROPOFOL;  Surgeon: Jerene Bears, MD;  Location: [place];  Service: Gastroenterology;  Laterality: N/A;  . ENTEROSCOPY N/A 12/31/2018   Procedure: ENTEROSCOPY;  Surgeon: Irene Shipper, MD;  Location: [facility] ENDOSCOPY;  Service: Endoscopy;  Laterality: N/A;  Push enteroscopy at 8am (spoke with Endo)  . ESOPHAGOGASTRODUODENOSCOPY N/A 05/11/2015   Procedure: ESOPHAGOGASTRODUODENOSCOPY (EGD);  Surgeon: Laurence Spates, MD;  Location: [facility] ENDOSCOPY;  Service: Endoscopy;  Laterality: N/A;  . ESOPHAGOGASTRODUODENOSCOPY (EGD) WITH PROPOFOL N/A 11/18/2018   Procedure: ESOPHAGOGASTRODUODENOSCOPY (EGD) WITH PROPOFOL;  Surgeon: Jerene Bears, MD;  Location: [facility] ENDOSCOPY;  Service: Gastroenterology;  Laterality: N/A;  . ESOPHAGOGASTRODUODENOSCOPY (EGD) WITH PROPOFOL N/A 05/15/2019   Procedure: ESOPHAGOGASTRODUODENOSCOPY (EGD) WITH PROPOFOL;  Surgeon: Jackquline Denmark, MD;  Location: [facility] ENDOSCOPY;  Service: Endoscopy;  Laterality: N/A;  . EYE SURGERY Bilateral 2010   Lasik  . FLEXIBLE SIGMOIDOSCOPY N/A 01/17/2017   Procedure: FLEXIBLE SIGMOIDOSCOPY w/ hemorrhoid banding possible;  Surgeon: Gatha Mayer, MD;  Location: [facility] ENDOSCOPY;  Service: Endoscopy;  Laterality:  N/A;  . FLEXIBLE SIGMOIDOSCOPY N/A 01/21/2019   Procedure: FLEXIBLE SIGMOIDOSCOPY;  Surgeon: Ronald Lobo, MD;  Location: [facility] ENDOSCOPY;  Service: Endoscopy;  Laterality: N/A;  Plan to do the test unprepped, unsedated  . FOOT AMPUTATION Left    due diabetic neuropathy, could not feel ulcer on bottom of foot  . GIVENS CAPSULE STUDY N/A 01/21/2019   Procedure: GIVENS CAPSULE STUDY;  Surgeon: Ronald Lobo, MD;  Location: [facility] ENDOSCOPY;   Service: Endoscopy;  Laterality: N/A;  . POLYPECTOMY  11/18/2018   Procedure: POLYPECTOMY;  Surgeon: Jerene Bears, MD;  Location: [facility] ENDOSCOPY;  Service: Gastroenterology;;  . REFRACTIVE SURGERY Bilateral 2013  . TONSILLECTOMY  1970's  . TUBAL LIGATION  2000    Allergies: Penicillins, Gabapentin, Lyrica [pregabalin], Saphris [asenapine], Tramadol, Adhesive [tape], Latex, and Other  Medications: Prior to Admission medications   Medication Sig Start Date End Date Taking? Authorizing Provider  acetaminophen (TYLENOL) 325 MG tablet Take 2 tablets (650 mg total) by mouth every 6 (six) hours as needed for fever, headache or moderate pain. 02/26/19  Yes Emokpae, Courage, MD  albuterol (PROVENTIL) (2.5 MG/3ML) 0.083% nebulizer solution Take 5 mg by nebulization daily as needed for wheezing or shortness of breath.   Yes [provider]  albuterol (VENTOLIN HFA) 108 (90 Base) MCG/ACT inhaler Inhale 2 puffs into the lungs every 4 (four) hours as needed for wheezing or shortness of breath. 01/26/19  Yes Dahal, Marlowe Aschoff, MD  allopurinol (ZYLOPRIM) 100 MG tablet Take 2 tablets (200 mg total) by mouth daily. 02/26/19 07/12/19 Yes Emokpae, Courage, MD  ALPRAZolam Duanne Moron) 0.5 MG tablet Take 0.5 mg by mouth 3 (three) times daily as needed for anxiety. 02/16/19  Yes [provider]  amLODipine (NORVASC) 2.5 MG tablet Take 2.5 mg by mouth daily. 03/16/19  Yes [provider]  atorvastatin (LIPITOR) 40 MG tablet Take 1 tablet (40 mg total) by mouth at bedtime. 02/26/19  Yes Emokpae, Courage, MD  citalopram (CELEXA) 20 MG tablet Take 20 mg by mouth daily.  05/21/19  Yes [provider]  Darbepoetin Alfa (ARANESP) 200 MCG/0.4ML SOSY injection Inject 0.4 mLs (200 mcg total) into the vein every Wednesday with hemodialysis. 01/27/19  Yes Dahal, Marlowe Aschoff, MD  diphenhydrAMINE (BENADRYL) 25 mg capsule Take 1 capsule (25 mg total) by mouth every 6 (six) hours as needed for up to 8 days for itching.  01/26/19 02/25/28 Yes Dahal, Marlowe Aschoff, MD  furosemide (LASIX) 80 MG tablet Take 80 mg by mouth 2 (two) times daily.   Yes [provider]  hydrocortisone (ANUSOL-HC) 2.5 % rectal cream Place 1 application rectally 2 (two) times daily. 04/03/19  Yes Danford, Suann Larry, MD  hydrOXYzine (ATARAX/VISTARIL) 25 MG tablet Take 1 tablet (25 mg total) by mouth 3 (three) times daily as needed for itching or anxiety. 02/26/19  Yes Emokpae, Courage, MD  insulin aspart (NOVOLOG) 100 UNIT/ML injection 0-9 Units, Subcutaneous, 3 times daily with meals CBG < 70: implement hypoglycemia protocol CBG 70 - 120: 0 units CBG 121 - 150: 1 unit CBG 151 - 200: 2 units CBG 201 - 250: 3 units CBG 251 - 300: 5 units CBG 301 - 350: 7 units CBG 351 - 400: 9 units CBG > 400: call MD Patient taking differently: Inject 0-9 Units into the skin 3 (three) times daily with meals. 0-9 Units, Subcutaneous, 3 times daily with meals CBG < 70: implement hypoglycemia protocol CBG 70 - 120: 0 units CBG 121 - 150: 1 unit CBG 151 - 200:  2 units CBG 201 - 250: 3 units CBG 251 - 300: 5 units CBG 301 - 350: 7 units CBG 351 - 400: 9 units CBG > 400: call MD 01/26/19  Yes Dahal, Marlowe Aschoff, MD  insulin glargine (LANTUS) 100 UNIT/ML injection Inject 24 Units into the skin at bedtime.   Yes [provider]  lidocaine-prilocaine (EMLA) cream Apply 1 application topically See admin instructions. On dialysis days 12/23/18  Yes [provider]  methocarbamol (ROBAXIN) 500 MG tablet Take 500 mg by mouth daily as needed for muscle spasms.   Yes [provider]  mupirocin ointment (BACTROBAN) 2 % Apply 1 application topically as needed (on arm). 04/03/19  Yes Danford, Suann Larry, MD  Oxycodone HCl 10 MG TABS Take 1 tablet (10 mg total) by mouth 4 (four) times daily. 04/03/19  Yes Danford, Suann Larry, MD  oxymetazoline (AFRIN) 0.05 % nasal spray Place 2 sprays into both nostrils 2 (two) times daily. 01/26/19  Yes Dahal, Marlowe Aschoff, MD   Pramoxine-Menthol-Petrolatum (SARNA ULTRA EX) Apply 1 application topically daily as needed (itch).   Yes [provider]  senna-docusate (SENOKOT-S) 8.6-50 MG tablet Take 2 tablets by mouth at bedtime. 02/26/19 02/26/20 Yes Emokpae, Courage, MD  pantoprazole (PROTONIX) 40 MG tablet Take 1 tablet (40 mg total) by mouth daily at 6 (six) AM for 30 days. 01/26/19 05/13/19  Terrilee Croak, MD     Family History  Problem Relation Age of Onset  . Hypertension Mother   . Diabetes Mother   . Hypertension Father   . Diabetes Father     Social History   Socioeconomic History  . Marital status: Single    Spouse name: Not on file  . Number of children: 3  . Years of education: 53  . Highest education level: 12th grade  Occupational History  . Occupation: disabled  Social Needs  . Financial resource strain: Not on file  . Food insecurity    Worry: Not on file    Inability: Not on file  . Transportation needs    Medical: Not on file    Non-medical: Not on file  Tobacco Use  . Smoking status: Current Some Day Smoker    Packs/day: 1.00    Years: 36.00    Pack years: 36.00    Types: Cigarettes  . Smokeless tobacco: Never Used  . Tobacco comment: 5 cigs in one setting  Substance and Sexual Activity  . Alcohol use: Not Currently    Alcohol/week: 3.0 standard drinks    Types: 3 Standard drinks or equivalent per week    Frequency: Never  . Drug use: Not Currently    Types: "Crack" cocaine, Cocaine    Comment: last use >2 years ago  . Sexual activity: Not Currently    Birth control/protection: Abstinence    Comment: offered condoms 05/2019  Lifestyle  . Physical activity    Days per week: Not on file    Minutes per session: Not on file  . Stress: Not on file  Relationships  . Social Herbalist on phone: Not on file    Gets together: Not on file    Attends religious service: Not on file    Active member of club or organization: Not on file    Attends meetings of  clubs or organizations: Not on file    Relationship status: Not on file  Other Topics Concern  . Not on file  Social History Narrative   ** Merged History Encounter **  Single, lives with son in a one story home. Unable to exercise. Avoids caffeine. 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         Review of Systems: A 12 point ROS discussed and pertinent positives are indicated in the HPI above.  All other systems are negative.  Review of Systems  Vital Signs: BP (!) 170/82   Pulse 91   Temp 98.6 F (37 C) (Oral)   Resp 18   LMP  (LMP Unknown)   SpO2 97%   Physical Exam Constitutional:      Appearance: She is ill-appearing. She is not diaphoretic.  Cardiovascular:     Rate and Rhythm: Normal rate and regular rhythm.  Pulmonary:     Effort: Pulmonary effort is normal. No respiratory distress.     Breath sounds: Normal breath sounds.  Abdominal:     General: Bowel sounds are normal.     Tenderness: There is guarding.  Skin:    General: Skin is warm and dry.  Neurological:     General: No focal deficit present.     Mental Status: She is alert and oriented to person, place, and time.  Psychiatric:        Mood and Affect: Mood normal.        Behavior: Behavior normal.     Imaging: Dg Chest 2 View  Result Date: 07/12/2019 CLINICAL DATA:  Chest pain, missed dialysis EXAM: CHEST - 2 VIEW COMPARISON:  04/01/2019 FINDINGS: Cardiomegaly. Mild, diffuse interstitial opacity and minimal fissural fluid. The visualized skeletal structures are unremarkable. IMPRESSION: Mild, diffuse interstitial pulmonary opacity and minimal fissural fluid, likely edema in the setting of cardiomegaly and dialysis. No focal airspace opacity. Electronically Signed   By: Eddie Candle M.D.   On: 07/12/2019 14:54   Nm Gi Blood Loss  Result Date: 07/13/2019 CLINICAL DATA:  Concern for gastrointestinal bleeding. Dark stool, nausea and vomiting. [place]ena EXAM:  NUCLEAR MEDICINE GASTROINTESTINAL BLEEDING SCAN TECHNIQUE: Sequential abdominal images were obtained following intravenous administration of Tc-35m labeled red blood cells. RADIOPHARMACEUTICALS:  Twenty mCi Tc-30m pertechnetate in-vitro labeled red cells. COMPARISON:  CT 05/14/2019 FINDINGS: During the first 60 minutes of imaging, focus of radiotracer accumulation develops in the RIGHT mid abdomen in relatively lateral position. Over the second hour imaging, radiotracer is progresses in tubular pattern in the central abdomen and LEFT lateral abdomen. Findings are most consistent with active gastrointestinal bleeding beginning in the hepatic flexure of the colon and blood advancing to the descending colon. IMPRESSION: Active gastrointestinal bleeding originating in the RIGHT colon. These results will be called to the ordering clinician or representative by the Radiologist Assistant, and communication documented in the PACS or zVision Dashboard. Electronically Signed   By: Suzy Bouchard M.D.   On: 07/13/2019 16:54    Labs:  CBC: Recent Labs    05/14/19 0340  05/15/19 1208 07/12/19 1438 07/12/19 1808 07/12/19 1809 07/13/19 1140  WBC 3.9*  --  5.7 3.6*  --   --  2.9*  HGB 4.7*   < > 7.9* 6.1* 5.8* 6.1* 6.4*  HCT 15.2*   < > 22.8* 19.5* 17.0* 18.0* 20.4*  PLT 102*  --  108* 88*  --   --  75*   < > = values in this interval not displayed.    COAGS: Recent Labs    10/05/18 0155 11/17/18 1016 12/29/18 0823 03/10/19 1254  INR 1.4* 1.2 1.3* 1.5*    BMP: Recent Labs  05/15/19 1208 07/12/19 1438 07/12/19 1753 07/12/19 1808 07/12/19 1809 07/13/19 1140  NA 133* 134* 134* 132* 132* 136  K 4.7 >7.5* 7.1* 7.0* 6.9* 4.0  CL 98 90* 97* 105  --  94*  CO2 19* 17* 14*  --   --  25  GLUCOSE 179* 221* 253* 239*  --  100*  BUN 53* 150* 136* 131*  --  49*  CALCIUM 7.7* 9.1 8.0*  --   --  8.2*  CREATININE 6.67* 11.49* 10.26* 11.40*  --  5.29*  GFRNONAA 7* 3* 4*  --   --  9*  GFRAA 8* 4* 4*   --   --  10*    LIVER FUNCTION TESTS: Recent Labs    03/11/19 0745  04/01/19 1145 05/13/19 1812 05/14/19 0340 07/12/19 1438  BILITOT 1.2  --  0.8 1.2  --  0.7  AST 44*  --  49* 43*  --  32  ALT 44  --  47* 63*  --  38  ALKPHOS 106  --  106 93  --  78  PROT 6.8  --  6.4* 6.9  --  7.3  ALBUMIN 3.3*   < > 3.3* 3.4* 2.9* 3.6   < > = values in this interval not displayed.    Assessment and Plan:  Acute lower GI bleed with positive NM bleeding scan in the Right colon.  Suspect diverticular bleed.  Still actively passing bloody BMs in the ED.  Pt agrees with angio embo procedure.  Consent obtained.  Risks and benefits of angio embo for GI bleeding were discussed with the patient including, but not limited to bleeding, infection, vascular injury or contrast induced renal failure.  This interventional procedure involves the use of X-rays and because of the nature of the planned procedure, it is possible that we will have prolonged use of X-ray fluoroscopy.  Potential radiation risks to you include (but are not limited to) the following: - A slightly elevated risk for cancer  several years later in life. This risk is typically less than 0.5% percent. This risk is low in comparison to the normal incidence of human cancer, which is 33% for women and 50% for men according to the [employer]. - Radiation induced injury can include skin redness, resembling a rash, tissue breakdown / ulcers and hair loss (which can be temporary or permanent).   The likelihood of either of these occurring depends on the difficulty of the procedure and whether you are sensitive to radiation due to previous procedures, disease, or genetic conditions.   IF your procedure requires a prolonged use of radiation, you will be notified and given written instructions for further action.  It is your responsibility to monitor the irradiated area for the 2 weeks following the procedure and to notify your  physician if you are concerned that you have suffered a radiation induced injury.    All of the patient's questions were answered, patient is agreeable to proceed.  Consent signed and in chart.     Thank you for this interesting consult.  I greatly enjoyed meeting [name] and look forward to participating in their care.  A copy of this report was sent to the requesting provider on this date.  Electronically Signed: Greggory Keen, MD 07/13/2019, 7:52 PM   I spent a total of 20 Minutes    in face to face in clinical consultation, greater than 50% of which was counseling/coordinating care for this pt with  GI Bleeding.

## 2019-07-13 NOTE — ED Notes (Signed)
Pt refusing to wear her cardiac monitor after "washing up" & removing the leads.

## 2019-07-13 NOTE — Progress Notes (Signed)
[facility] Progress Note  Subjective: pt out of room all afternoon, K 4.0 today  Vitals:   07/13/19 0830 07/13/19 0900 07/13/19 0930 07/13/19 1000  BP: (!) 184/79 (!) 176/75 (!) 182/83 (!) 161/73  Pulse: 90 90 89   Resp: (!) 8 17 16 15   Temp:      TempSrc:      SpO2: 92% 90% 94%     Inpatient medications: . sodium chloride   Intravenous Once  . allopurinol  200 mg Oral Daily  . amLODipine  2.5 mg Oral Daily  . Chlorhexidine Gluconate Cloth  6 each Topical Q0600  . furosemide  80 mg Oral BID  . insulin aspart  0-5 Units Subcutaneous QHS  . insulin aspart  0-6 Units Subcutaneous TID WC  . [START ON 07/16/2019] pantoprazole  40 mg Intravenous Q12H   . pantoprozole (PROTONIX) infusion 8 mg/hr (07/13/19 0246)   acetaminophen **OR** acetaminophen, albuterol, ALPRAZolam, calcium carbonate (dosed in mg elemental calcium), dextrose, docusate sodium, feeding supplement (NEPRO CARB STEADY), oxyCODONE, sorbitol, zolpidem    Exam: Gen alert, no distress, lying flat No rash, cyanosis or gangrene Sclera anicteric, throat clear  No jvd or bruits Chest clear bilat to bases no rales RRR no MRG Abd soft ntnd no mass or ascites +bs GU  defer MS no joint effusions or deformity Ext mild LE edema, no wounds or ulcers Neuro is alert, Ox 3 , nf L AVG +bruit    Outpt HD: GO MWF  3.5h  73.5kg  P4  AVG  Hep none  - calc 2.5 ug tiw  - mircera 225 q 2, last ?     Assessment/ Plan: 1. Hyperkalemia - severe > 7.5 w/o ekg changes. Had HD yest , K 4.0 today.  2. ESRD - MWF HD.  Had HD last night, plan next HD tomorrow.  3. GIB / anemia of ABL - Hb low 6.4 after 1u prbc yest, per GI/ primary, 2 more u prbc's ordered for today.  4. Anemia ckd - cont esa, get records 5. HTN - get vol down, cont any meds 6. Hep C     Rob Melford Tullier 07/13/2019, 4:19 PM  Iron/TIBC/Ferritin/ %Sat    Component Value Date/Time   IRON 95 04/01/2019 2013   TIBC 326 04/01/2019 2013   FERRITIN 804 (H) 04/01/2019 2013   IRONPCTSAT 29 04/01/2019 2013   Recent Labs  Lab 07/12/19 1438  07/13/19 1140  NA 134*   < > 136  K >7.5*   < > 4.0  CL 90*   < > 94*  CO2 17*   < > 25  GLUCOSE 221*   < > 100*  BUN 150*   < > 49*  CREATININE 11.49*   < > 5.29*  CALCIUM 9.1   < > 8.2*  ALBUMIN 3.6  --   --    < > = values in this interval not displayed.   Recent Labs  Lab 07/12/19 1438  AST 32  ALT 38  ALKPHOS 78  BILITOT 0.7  PROT 7.3   Recent Labs  Lab 07/13/19 1140  WBC 2.9*  HGB 6.4*  HCT 20.4*  PLT 75*

## 2019-07-13 NOTE — ED Notes (Signed)
Pt refusing Heart Monitoring

## 2019-07-13 NOTE — ED Notes (Signed)
Refuses ekg leads placement

## 2019-07-13 NOTE — Sedation Documentation (Signed)
R groin level 0. Gauze/tegaderm bandage applied. CDI. 3+ RDP, 1+RPT.

## 2019-07-13 NOTE — Procedures (Signed)
S/p SMA angio with peripheral Rt colic branch micro coil embo  Cecal hypervascularity/blushing with large draining vein c/w angiodysplasia. Location correlates with the NM GI bleeding scan.  No comp Stable ebl min Full report in pacs tomorrow

## 2019-07-13 NOTE — Sedation Documentation (Signed)
Spoke with [facility] in ED and told he [name] will be back to room 40 in 5 minutes.

## 2019-07-13 NOTE — Consult Note (Signed)
Referring Provider:  [place] Primary Care Physician:  Rocco Serene, MD Primary Gastroenterologist: Althia Forts, has seen multiple GI groups in hospital.  Questionable dismissed from [facility] GI in the past  Reason for Consultation: Recurrent GI bleed  HPI: [name] is a 53 y.o. female with past medical history of end-stage renal disease on hemodialysis, history of CHF with EF of 40%, history of schizoaffective disorder, bipolar disorder and history of recurrent GI bleed with negative multiple extensive work-up in the past presented to the hospital again with rectal bleeding.  GI is consulted for further evaluation.  Patient seen and examined at bedside.  She started having dark/maroon color as well as bright blood per rectum 3 to 4 days ago.  Multiple bloody bowel movement since then, last one around 1 hour ago.  Complaining of generalized abdominal discomfort.  Denies any nausea or vomiting.  Previous GI work-up -Please see GI consult note dated May 14, 2019 for GI  work-up documentation.  EGD by Dr. Lyndel Safe in October 2020 was normal.  Capsule endoscopy in June 2020 negative  for active bleeding.  Flexible sigmoidoscopy by Dr. Cristina Gong in June 2020 showed dark/old blood in the rectum and sigmoid colon.  Past Medical History:  Diagnosis Date  . Anemia   . Anxiety   . Bipolar disorder, unspecified ([place]) 02/06/2014  . Chronic combined systolic and diastolic CHF (congestive heart failure) (HCC)    EF 40% by echo and 48% by stress test  . Chronic pain syndrome   . Depression   . ESRD on hemodialysis (HCC)    TTS  . GERD (gastroesophageal reflux disease)   . Glaucoma   . Headache    sinus headaches  . Hepatitis C   . High cholesterol   . History of hiatal hernia   . History of vitamin D deficiency 06/20/2015  . Hypertension   . MRSA infection ?2014   "on the back of my head; spread throughout my bloodstream"  . Neuropathy   . Refusal of blood transfusions as patient is Jehovah's  Witness    occasionally allows transfusions.    . Schizoaffective disorder, unspecified condition 02/08/2014  . Seizure [facility]) dx'd 2014   "don't know what kind; last one was ~ 04/2014"  . TIA (transient ischemic attack) 2010  . Type II diabetes mellitus ([place])    INSULIN DEPENDENT    Past Surgical History:  Procedure Laterality Date  . ABDOMINAL HYSTERECTOMY  2014  . ABDOMINAL HYSTERECTOMY  2010  . AMPUTATION Left 05/21/2013   Procedure: Left Midfoot AMPUTATION;  Surgeon: Newt Minion, MD;  Location: [place];  Service: Orthopedics;  Laterality: Left;  Left Midfoot Amputation  . AV FISTULA PLACEMENT Left 05/04/2015   Procedure: ARTERIOVENOUS (AV) GRAFT INSERTION;  Surgeon: Angelia Mould, MD;  Location: [place];  Service: Vascular;  Laterality: Left;  . BIOPSY  11/18/2018   Procedure: BIOPSY;  Surgeon: Jerene Bears, MD;  Location: [facility] ENDOSCOPY;  Service: Gastroenterology;;  . COLONOSCOPY WITH PROPOFOL N/A 06/22/2013   Procedure: COLONOSCOPY WITH PROPOFOL;  Surgeon: Jeryl Columbia, MD;  Location: [facility] ENDOSCOPY;  Service: Endoscopy;  Laterality: N/A;  . COLONOSCOPY WITH PROPOFOL N/A 11/18/2018   Procedure: COLONOSCOPY WITH PROPOFOL;  Surgeon: Jerene Bears, MD;  Location: [place];  Service: Gastroenterology;  Laterality: N/A;  . ENTEROSCOPY N/A 12/31/2018   Procedure: ENTEROSCOPY;  Surgeon: Irene Shipper, MD;  Location[facility] ENDOSCOPY;  Service: Endoscopy;  Laterality: N/A;  Push enteroscopy at 8am (spoke with  Endo)  . ESOPHAGOGASTRODUODENOSCOPY N/A 05/11/2015   Procedure: ESOPHAGOGASTRODUODENOSCOPY (EGD);  Surgeon: Laurence Spates, MD;  Location: [facility] ENDOSCOPY;  Service: Endoscopy;  Laterality: N/A;  . ESOPHAGOGASTRODUODENOSCOPY (EGD) WITH PROPOFOL N/A 11/18/2018   Procedure: ESOPHAGOGASTRODUODENOSCOPY (EGD) WITH PROPOFOL;  Surgeon: Jerene Bears, MD;  Location: [facility] ENDOSCOPY;  Service: Gastroenterology;  Laterality: N/A;  . ESOPHAGOGASTRODUODENOSCOPY (EGD) WITH PROPOFOL N/A 05/15/2019    Procedure: ESOPHAGOGASTRODUODENOSCOPY (EGD) WITH PROPOFOL;  Surgeon: Jackquline Denmark, MD;  Location: [facility] ENDOSCOPY;  Service: Endoscopy;  Laterality: N/A;  . EYE SURGERY Bilateral 2010   Lasik  . FLEXIBLE SIGMOIDOSCOPY N/A 01/17/2017   Procedure: FLEXIBLE SIGMOIDOSCOPY w/ hemorrhoid banding possible;  Surgeon: Gatha Mayer, MD;  Location: [facility] ENDOSCOPY;  Service: Endoscopy;  Laterality: N/A;  . FLEXIBLE SIGMOIDOSCOPY N/A 01/21/2019   Procedure: FLEXIBLE SIGMOIDOSCOPY;  Surgeon: Ronald Lobo, MD;  Location: [facility] ENDOSCOPY;  Service: Endoscopy;  Laterality: N/A;  Plan to do the test unprepped, unsedated  . FOOT AMPUTATION Left    due diabetic neuropathy, could not feel ulcer on bottom of foot  . GIVENS CAPSULE STUDY N/A 01/21/2019   Procedure: GIVENS CAPSULE STUDY;  Surgeon: Ronald Lobo, MD;  Location: [facility] ENDOSCOPY;  Service: Endoscopy;  Laterality: N/A;  . POLYPECTOMY  11/18/2018   Procedure: POLYPECTOMY;  Surgeon: Jerene Bears, MD;  Location: [facility] ENDOSCOPY;  Service: Gastroenterology;;  . REFRACTIVE SURGERY Bilateral 2013  . TONSILLECTOMY  1970's  . TUBAL LIGATION  2000    Prior to Admission medications   Medication Sig Start Date End Date Taking? Authorizing Provider  acetaminophen (TYLENOL) 325 MG tablet Take 2 tablets (650 mg total) by mouth every 6 (six) hours as needed for fever, headache or moderate pain. 02/26/19  Yes Emokpae, Courage, MD  albuterol (PROVENTIL) (2.5 MG/3ML) 0.083% nebulizer solution Take 5 mg by nebulization daily as needed for wheezing or shortness of breath.   Yes [provider]  albuterol (VENTOLIN HFA) 108 (90 Base) MCG/ACT inhaler Inhale 2 puffs into the lungs every 4 (four) hours as needed for wheezing or shortness of breath. 01/26/19  Yes Dahal, Marlowe Aschoff, MD  allopurinol (ZYLOPRIM) 100 MG tablet Take 2 tablets (200 mg total) by mouth daily. 02/26/19 07/12/19 Yes Emokpae, Courage, MD  ALPRAZolam Duanne Moron) 0.5 MG tablet Take 0.5 mg by mouth 3 (three) times daily  as needed for anxiety. 02/16/19  Yes [provider]  amLODipine (NORVASC) 2.5 MG tablet Take 2.5 mg by mouth daily. 03/16/19  Yes [provider]  atorvastatin (LIPITOR) 40 MG tablet Take 1 tablet (40 mg total) by mouth at bedtime. 02/26/19  Yes Emokpae, Courage, MD  citalopram (CELEXA) 20 MG tablet Take 20 mg by mouth daily.  05/21/19  Yes [provider]  Darbepoetin Alfa (ARANESP) 200 MCG/0.4ML SOSY injection Inject 0.4 mLs (200 mcg total) into the vein every Wednesday with hemodialysis. 01/27/19  Yes Dahal, Marlowe Aschoff, MD  diphenhydrAMINE (BENADRYL) 25 mg capsule Take 1 capsule (25 mg total) by mouth every 6 (six) hours as needed for up to 8 days for itching. 01/26/19 02/25/28 Yes Dahal, Marlowe Aschoff, MD  furosemide (LASIX) 80 MG tablet Take 80 mg by mouth 2 (two) times daily.   Yes [provider]  hydrocortisone (ANUSOL-HC) 2.5 % rectal cream Place 1 application rectally 2 (two) times daily. 04/03/19  Yes Danford, Suann Larry, MD  hydrOXYzine (ATARAX/VISTARIL) 25 MG tablet Take 1 tablet (25 mg total) by mouth 3 (three) times daily as needed for itching or anxiety. 02/26/19  Yes Roxan Hockey, MD  insulin  aspart (NOVOLOG) 100 UNIT/ML injection 0-9 Units, Subcutaneous, 3 times daily with meals CBG < 70: implement hypoglycemia protocol CBG 70 - 120: 0 units CBG 121 - 150: 1 unit CBG 151 - 200: 2 units CBG 201 - 250: 3 units CBG 251 - 300: 5 units CBG 301 - 350: 7 units CBG 351 - 400: 9 units CBG > 400: call MD Patient taking differently: Inject 0-9 Units into the skin 3 (three) times daily with meals. 0-9 Units, Subcutaneous, 3 times daily with meals CBG < 70: implement hypoglycemia protocol CBG 70 - 120: 0 units CBG 121 - 150: 1 unit CBG 151 - 200: 2 units CBG 201 - 250: 3 units CBG 251 - 300: 5 units CBG 301 - 350: 7 units CBG 351 - 400: 9 units CBG > 400: call MD 01/26/19  Yes Dahal, Marlowe Aschoff, MD  insulin glargine (LANTUS) 100 UNIT/ML injection Inject 24 Units into the skin at  bedtime.   Yes [provider]  lidocaine-prilocaine (EMLA) cream Apply 1 application topically See admin instructions. On dialysis days 12/23/18  Yes [provider]  methocarbamol (ROBAXIN) 500 MG tablet Take 500 mg by mouth daily as needed for muscle spasms.   Yes [provider]  mupirocin ointment (BACTROBAN) 2 % Apply 1 application topically as needed (on arm). 04/03/19  Yes Danford, Suann Larry, MD  Oxycodone HCl 10 MG TABS Take 1 tablet (10 mg total) by mouth 4 (four) times daily. 04/03/19  Yes Danford, Suann Larry, MD  oxymetazoline (AFRIN) 0.05 % nasal spray Place 2 sprays into both nostrils 2 (two) times daily. 01/26/19  Yes Dahal, Marlowe Aschoff, MD  Pramoxine-Menthol-Petrolatum (SARNA ULTRA EX) Apply 1 application topically daily as needed (itch).   Yes [provider]  senna-docusate (SENOKOT-S) 8.6-50 MG tablet Take 2 tablets by mouth at bedtime. 02/26/19 02/26/20 Yes Emokpae, Courage, MD  pantoprazole (PROTONIX) 40 MG tablet Take 1 tablet (40 mg total) by mouth daily at 6 (six) AM for 30 days. 01/26/19 05/13/19  Terrilee Croak, MD    Scheduled Meds: . Chlorhexidine Gluconate Cloth  6 each Topical Q0600  . insulin aspart  0-5 Units Subcutaneous QHS  . insulin aspart  0-6 Units Subcutaneous TID WC  . [START ON 07/16/2019] pantoprazole  40 mg Intravenous Q12H   Continuous Infusions: . pantoprozole (PROTONIX) infusion 8 mg/hr (07/13/19 0246)   PRN Meds:.acetaminophen **OR** acetaminophen, calcium carbonate (dosed in mg elemental calcium), dextrose, docusate sodium, feeding supplement (NEPRO CARB STEADY), oxyCODONE, sorbitol, zolpidem  Allergies as of 07/12/2019 - Review Complete 07/12/2019  Allergen Reaction Noted  . Penicillins Hives and Other (See Comments) 09/30/2014  . Gabapentin Itching and Swelling 02/09/2013  . Lyrica [pregabalin] Swelling and Other (See Comments) 09/30/2014  . Saphris [asenapine] Swelling and Other (See Comments) 09/30/2014  .  Tramadol Swelling and Other (See Comments) 09/30/2014  . Adhesive [tape] Itching 09/09/2017  . Latex Itching 09/09/2017  . Other Other (See Comments) 05/01/2015    Family History  Problem Relation Age of Onset  . Hypertension Mother   . Diabetes Mother   . Hypertension Father   . Diabetes Father     Social History   Socioeconomic History  . Marital status: Single    Spouse name: Not on file  . Number of children: 3  . Years of education: 2  . Highest education level: 12th grade  Occupational History  . Occupation: disabled  Social Needs  . Financial resource strain: Not on file  . Food insecurity  Worry: Not on file    Inability: Not on file  . Transportation needs    Medical: Not on file    Non-medical: Not on file  Tobacco Use  . Smoking status: Current Some Day Smoker    Packs/day: 1.00    Years: 36.00    Pack years: 36.00    Types: Cigarettes  . Smokeless tobacco: Never Used  . Tobacco comment: 5 cigs in one setting  Substance and Sexual Activity  . Alcohol use: Not Currently    Alcohol/week: 3.0 standard drinks    Types: 3 Standard drinks or equivalent per week    Frequency: Never  . Drug use: Not Currently    Types: "Crack" cocaine, Cocaine    Comment: last use >2 years ago  . Sexual activity: Not Currently    Birth control/protection: Abstinence    Comment: offered condoms 05/2019  Lifestyle  . Physical activity    Days per week: Not on file    Minutes per session: Not on file  . Stress: Not on file  Relationships  . Social Herbalist on phone: Not on file    Gets together: Not on file    Attends religious service: Not on file    Active member of club or organization: Not on file    Attends meetings of clubs or organizations: Not on file    Relationship status: Not on file  . Intimate partner violence    Fear of current or ex partner: Not on file    Emotionally abused: Not on file    Physically abused: Not on file    Forced  sexual activity: Not on file  Other Topics Concern  . Not on file  Social History Narrative   ** Merged History Encounter **   Single, lives with son in a one story home. Unable to exercise. Avoids caffeine. 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        Review of Systems: All negative except as stated above in HPI.  Physical Exam: Vital signs: Vitals:   07/13/19 0930 07/13/19 1000  BP: (!) 182/83 (!) 161/73  Pulse: 89   Resp: 16 15  Temp:    SpO2: 94%      Physical Exam  Constitutional: She is oriented to person, place, and time. She appears well-developed and well-nourished.  HENT:  Head: Normocephalic and atraumatic.  Eyes: EOM are normal. No scleral icterus.  Neck: Normal range of motion. Neck supple.  Cardiovascular: Normal rate and regular rhythm.  Pulmonary/Chest: Effort normal. No respiratory distress.  Anterior exam only  Abdominal: Soft. Bowel sounds are normal. She exhibits no distension. There is no abdominal tenderness. There is no rebound and no guarding.  Musculoskeletal: Normal range of motion.  Neurological: She is alert and oriented to person, place, and time.  Skin: Skin is warm. No erythema.  Psychiatric: She has a normal mood and affect. Judgment and thought content normal.  Vitals reviewed.   GI:  Lab Results: Recent Labs    07/12/19 1438 07/12/19 1808 07/12/19 1809  WBC 3.6*  --   --   HGB 6.1* 5.8* 6.1*  HCT 19.5* 17.0* 18.0*  PLT 88*  --   --    BMET Recent Labs    07/12/19 1438 07/12/19 1753 07/12/19 1808 07/12/19 1809  NA 134* 134* 132* 132*  K >7.5* 7.1* 7.0* 6.9*  CL 90* 97* 105  --   CO2 17* 14*  --   --  GLUCOSE 221* 253* 239*  --   BUN 150* 136* 131*  --   CREATININE 11.49* 10.26* 11.40*  --   CALCIUM 9.1 8.0*  --   --    LFT Recent Labs    07/12/19 1438  PROT 7.3  ALBUMIN 3.6  AST 32  ALT 38  ALKPHOS 78  BILITOT 0.7   PT/INR No results for input(s): LABPROT, INR  in the last 72 hours.   Studies/Results: Dg Chest 2 View  Result Date: 07/12/2019 CLINICAL DATA:  Chest pain, missed dialysis EXAM: CHEST - 2 VIEW COMPARISON:  04/01/2019 FINDINGS: Cardiomegaly. Mild, diffuse interstitial opacity and minimal fissural fluid. The visualized skeletal structures are unremarkable. IMPRESSION: Mild, diffuse interstitial pulmonary opacity and minimal fissural fluid, likely edema in the setting of cardiomegaly and dialysis. No focal airspace opacity. Electronically Signed   By: Eddie Candle M.D.   On: 07/12/2019 14:54    Impression/Plan: -Recurrent GI bleed.  Multiple EGD, colonoscopy, flexible sigmoidoscopy and capsule endoscopy in the recent past.   flexible sigmoidoscopy in June 2020 showed old blood in the sigmoid colon and rectum.  Bleeding scan negative in March 2020.  CTA negative for active bleeding in August 2020 but did showed cirrhosis of the liver.  -Cirrhosis of the liver based on CT scan findings.  Normal LFTs.  She does have thrombocytopenia.  Recent EGD negative for varices or portal gastropathy. -End-stage renal disease on dialysis. -History of CHF -History of seasoaffective disorder and bipolar disorder  Recommendations ------------------------- -Recommend repeating bleeding scan. -Check PT/INR. -If bleeding scan negative and if evidence of ongoing bleeding, consider repeating EGD and colonoscopy -Monitor H&H.  Transfuse to keep hemoglobin around 7-8. -GI will follow   LOS: 1 day   Otis Brace  MD, FACP 07/13/2019, 12:19 PM  Contact #  [phone]

## 2019-07-14 ENCOUNTER — Encounter (HOSPITAL_COMMUNITY): Payer: Self-pay | Admitting: Interventional Radiology

## 2019-07-14 LAB — BASIC METABOLIC PANEL
Anion gap: 17 — ABNORMAL HIGH (ref 5–15)
Anion gap: 18 — ABNORMAL HIGH (ref 5–15)
BUN: 49 mg/dL — ABNORMAL HIGH (ref 6–20)
BUN: 64 mg/dL — ABNORMAL HIGH (ref 6–20)
CO2: 25 mmol/L (ref 22–32)
CO2: 25 mmol/L (ref 22–32)
Calcium: 8.2 mg/dL — ABNORMAL LOW (ref 8.9–10.3)
Calcium: 8.8 mg/dL — ABNORMAL LOW (ref 8.9–10.3)
Chloride: 94 mmol/L — ABNORMAL LOW (ref 98–111)
Chloride: 92 mmol/L — ABNORMAL LOW (ref 98–111)
Creatinine, Ser: 5.29 mg/dL — ABNORMAL HIGH (ref 0.44–1.00)
Creatinine, Ser: 6.28 mg/dL — ABNORMAL HIGH (ref 0.44–1.00)
GFR calc Af Amer: 10 mL/min — ABNORMAL LOW (ref >60)
GFR calc Af Amer: 8 mL/min — ABNORMAL LOW (ref >60)
GFR calc non Af Amer: 9 mL/min — ABNORMAL LOW (ref >60)
GFR calc non Af Amer: 7 mL/min — ABNORMAL LOW (ref >60)
Glucose, Bld: 100 mg/dL — ABNORMAL HIGH (ref 70–99)
Glucose, Bld: 117 mg/dL — ABNORMAL HIGH (ref 70–99)
Potassium: 4 mmol/L (ref 3.5–5.1)
Potassium: 4.8 mmol/L (ref 3.5–5.1)
Sodium: 136 mmol/L (ref 135–145)
Sodium: 135 mmol/L (ref 135–145)

## 2019-07-14 LAB — GLUCOSE, CAPILLARY
Glucose-Capillary: 115 mg/dL — ABNORMAL HIGH (ref 70–99)
Glucose-Capillary: 130 mg/dL — ABNORMAL HIGH (ref 70–99)
Glucose-Capillary: 273 mg/dL — ABNORMAL HIGH (ref 70–99)
Glucose-Capillary: 180 mg/dL — ABNORMAL HIGH (ref 70–99)

## 2019-07-14 LAB — CBC
HCT: 30.3 % — ABNORMAL LOW (ref 36.0–46.0)
Hemoglobin: 10 g/dL — ABNORMAL LOW (ref 12.0–15.0)
MCH: 32.4 pg (ref 26.0–34.0)
MCHC: 33 g/dL (ref 30.0–36.0)
MCV: 98.1 fL (ref 80.0–100.0)
Platelets: 83 10*3/uL — ABNORMAL LOW (ref 150–400)
RBC: 3.09 MIL/uL — ABNORMAL LOW (ref 3.87–5.11)
RDW: 19.8 % — ABNORMAL HIGH (ref 11.5–15.5)
WBC: 4.9 10*3/uL (ref 4.0–10.5)
nRBC: 0 % (ref 0.0–0.2)

## 2019-07-14 LAB — MRSA PCR SCREENING: MRSA by PCR: NEGATIVE

## 2019-07-14 LAB — PROTIME-INR
INR: 1.1 (ref 0.8–1.2)
Prothrombin Time: 14.5 seconds (ref 11.4–15.2)

## 2019-07-14 LAB — HEPATITIS B SURFACE ANTIGEN: Hepatitis B Surface Ag: NONREACTIVE

## 2019-07-14 MED ORDER — PANTOPRAZOLE SODIUM 40 MG PO TBEC
40.0000 mg | DELAYED_RELEASE_TABLET | Freq: Every day | ORAL | Status: DC
  Administered 2019-07-14 – 2019-07-16 (×3): 40 mg via ORAL
  Filled 2019-07-14 (×3): qty 1

## 2019-07-14 NOTE — Plan of Care (Signed)
  Problem: Clinical Measurements: Goal: Diagnostic test results will improve Outcome: Progressing   Problem: Clinical Measurements: Goal: Respiratory complications will improve Outcome: Progressing   Problem: Activity: Goal: Risk for activity intolerance will decrease Outcome: Progressing   Problem: Nutrition: Goal: Adequate nutrition will be maintained Outcome: Progressing   Problem: Elimination: Goal: Will not experience complications related to bowel motility Outcome: Progressing Goal: Will not experience complications related to urinary retention Outcome: Progressing   Problem: Pain Managment: Goal: General experience of comfort will improve Outcome: Progressing   Problem: Safety: Goal: Ability to remain free from injury will improve Outcome: Progressing   Problem: Skin Integrity: Goal: Risk for impaired skin integrity will decrease Outcome: Progressing   Problem: Education: Goal: Ability to identify signs and symptoms of gastrointestinal bleeding will improve Outcome: Progressing

## 2019-07-14 NOTE — Progress Notes (Addendum)
[facility] Gastroenterology Progress Note  [name] 53 y.o. 03/14/1966  CC: Recurrent GI bleed   Subjective: Yesterday's events noted.  Bleeding scan was positive for right colonic bleed.  Underwent IR guided embolization was found to have bleeding in the cecum which was treated with embolization of right colic branch.  Patient had one episode of small bleeding early in the morning but denies any further bowel movements.  Denies abdominal pain.  ROS : Negative for chest pain and shortness of breath   Objective: Vital signs in last 24 hours: Vitals:   07/14/19 0312 07/14/19 0831  BP: (!) 195/96 (!) 195/91  Pulse: 86 88  Resp: 18 20  Temp: 97.7 F (36.5 C) (!) 97.5 F (36.4 C)  SpO2: 100% 100%    Physical Exam:  General.  Well-developed, cooperative, not in acute distress Abdomen.  Soft, nontender, nondistended, bowel sounds present Neuro.  Alert/oriented x3 Psych.  Mood and affect normal Lab Results: Recent Labs    07/13/19 1140 07/14/19 0520  NA 136 135  K 4.0 4.8  CL 94* 92*  CO2 25 25  GLUCOSE 100* 117*  BUN 49* 64*  CREATININE 5.29* 6.28*  CALCIUM 8.2* 8.8*   Recent Labs    07/12/19 1438  AST 32  ALT 38  ALKPHOS 78  BILITOT 0.7  PROT 7.3  ALBUMIN 3.6   Recent Labs    07/13/19 1140 07/14/19 0520  WBC 2.9* 4.9  HGB 6.4* 10.0*  HCT 20.4* 30.3*  MCV 106.8* 98.1  PLT 75* 83*   Recent Labs    07/14/19 0520  LABPROT 14.5  INR 1.1      Assessment/Plan: -Recurrent GI bleed.  Multiple EGD, colonoscopy, flexible sigmoidoscopy and capsule endoscopy in the recent past.   flexible sigmoidoscopy in June 2020 showed old blood in the sigmoid colon and rectum.  Bleeding scan negative in March 2020.  CTA negative for active bleeding in August 2020 but did showed cirrhosis of the liver.  -Cirrhosis of the liver based on CT scan findings.  Normal LFTs.  She does have thrombocytopenia.  Recent EGD negative for varices or portal gastropathy. -End-stage  renal disease on dialysis. -History of CHF -History of seasoaffective disorder and bipolar disorder  Recommendations ------------------------- Bleeding scan was positive for right colonic bleed.  Underwent IR guided embolization was found to have bleeding in the cecum which was treated with embolization of right colic branch.  -Hemoglobin stable. -Advance diet to soft -Monitor H&H -DC Protonix drip.  Start Protonix 40 mg once a day -GI will follow   Otis Brace MD, [place] 07/14/2019, 8:45 AM  Contact #  [phone]

## 2019-07-14 NOTE — Progress Notes (Signed)
Referring Physician(s): Bonner Puna  Supervising Physician: Sandi Mariscal  Patient Status:  [facility] - In-pt  Chief Complaint:  Acute lower GI bleed  Brief History:  [name] is a 53 y.o. female with past medical history of end-stage renal disease on hemodialysis, history of CHF with EF of 40%, history of schizoaffective disorder, bipolar disorder.  She also has a history of recurrent GI bleed with negative multiple extensive work-up in the past presented to the hospital again with rectal bleeding.  She underwent mesenteric angiography which showed cecal hypervascularity/blushing with large draining vein c/w angiodysplasia. Location correlates with the NM GI bleeding scan. She had micro coil embolization of the right colic branch by Dr. Annamaria Boots last night.  Subjective:  She is doing well this morning. No complaints. Denies any further bleeding.  Allergies: Penicillins, Gabapentin, Lyrica [pregabalin], Saphris [asenapine], Tramadol, Adhesive [tape], Latex, and Other  Medications: Prior to Admission medications   Medication Sig Start Date End Date Taking? Authorizing Provider  acetaminophen (TYLENOL) 325 MG tablet Take 2 tablets (650 mg total) by mouth every 6 (six) hours as needed for fever, headache or moderate pain. 02/26/19  Yes Emokpae, Courage, MD  albuterol (PROVENTIL) (2.5 MG/3ML) 0.083% nebulizer solution Take 5 mg by nebulization daily as needed for wheezing or shortness of breath.   Yes [provider]  albuterol (VENTOLIN HFA) 108 (90 Base) MCG/ACT inhaler Inhale 2 puffs into the lungs every 4 (four) hours as needed for wheezing or shortness of breath. 01/26/19  Yes Dahal, Marlowe Aschoff, MD  allopurinol (ZYLOPRIM) 100 MG tablet Take 2 tablets (200 mg total) by mouth daily. 02/26/19 07/12/19 Yes Emokpae, Courage, MD  ALPRAZolam Duanne Moron) 0.5 MG tablet Take 0.5 mg by mouth 3 (three) times daily as needed for anxiety. 02/16/19  Yes [provider]  amLODipine (NORVASC) 2.5 MG  tablet Take 2.5 mg by mouth daily. 03/16/19  Yes [provider]  atorvastatin (LIPITOR) 40 MG tablet Take 1 tablet (40 mg total) by mouth at bedtime. 02/26/19  Yes Emokpae, Courage, MD  citalopram (CELEXA) 20 MG tablet Take 20 mg by mouth daily.  05/21/19  Yes [provider]  Darbepoetin Alfa (ARANESP) 200 MCG/0.4ML SOSY injection Inject 0.4 mLs (200 mcg total) into the vein every Wednesday with hemodialysis. 01/27/19  Yes Dahal, Marlowe Aschoff, MD  diphenhydrAMINE (BENADRYL) 25 mg capsule Take 1 capsule (25 mg total) by mouth every 6 (six) hours as needed for up to 8 days for itching. 01/26/19 02/25/28 Yes Dahal, Marlowe Aschoff, MD  furosemide (LASIX) 80 MG tablet Take 80 mg by mouth 2 (two) times daily.   Yes [provider]  hydrocortisone (ANUSOL-HC) 2.5 % rectal cream Place 1 application rectally 2 (two) times daily. 04/03/19  Yes Danford, Suann Larry, MD  hydrOXYzine (ATARAX/VISTARIL) 25 MG tablet Take 1 tablet (25 mg total) by mouth 3 (three) times daily as needed for itching or anxiety. 02/26/19  Yes Emokpae, Courage, MD  insulin aspart (NOVOLOG) 100 UNIT/ML injection 0-9 Units, Subcutaneous, 3 times daily with meals CBG < 70: implement hypoglycemia protocol CBG 70 - 120: 0 units CBG 121 - 150: 1 unit CBG 151 - 200: 2 units CBG 201 - 250: 3 units CBG 251 - 300: 5 units CBG 301 - 350: 7 units CBG 351 - 400: 9 units CBG > 400: call MD Patient taking differently: Inject 0-9 Units into the skin 3 (three) times daily with meals. 0-9 Units, Subcutaneous, 3 times daily with meals CBG < 70: implement hypoglycemia protocol CBG  70 - 120: 0 units CBG 121 - 150: 1 unit CBG 151 - 200: 2 units CBG 201 - 250: 3 units CBG 251 - 300: 5 units CBG 301 - 350: 7 units CBG 351 - 400: 9 units CBG > 400: call MD 01/26/19  Yes Dahal, Marlowe Aschoff, MD  insulin glargine (LANTUS) 100 UNIT/ML injection Inject 24 Units into the skin at bedtime.   Yes [provider]  lidocaine-prilocaine (EMLA) cream Apply 1  application topically See admin instructions. On dialysis days 12/23/18  Yes [provider]  methocarbamol (ROBAXIN) 500 MG tablet Take 500 mg by mouth daily as needed for muscle spasms.   Yes [provider]  mupirocin ointment (BACTROBAN) 2 % Apply 1 application topically as needed (on arm). 04/03/19  Yes Danford, Suann Larry, MD  Oxycodone HCl 10 MG TABS Take 1 tablet (10 mg total) by mouth 4 (four) times daily. 04/03/19  Yes Danford, Suann Larry, MD  oxymetazoline (AFRIN) 0.05 % nasal spray Place 2 sprays into both nostrils 2 (two) times daily. 01/26/19  Yes Dahal, Marlowe Aschoff, MD  Pramoxine-Menthol-Petrolatum (SARNA ULTRA EX) Apply 1 application topically daily as needed (itch).   Yes [provider]  senna-docusate (SENOKOT-S) 8.6-50 MG tablet Take 2 tablets by mouth at bedtime. 02/26/19 02/26/20 Yes Emokpae, Courage, MD  pantoprazole (PROTONIX) 40 MG tablet Take 1 tablet (40 mg total) by mouth daily at 6 (six) AM for 30 days. 01/26/19 05/13/19  Terrilee Croak, MD     Vital Signs: BP (!) 195/91   Pulse 88   Temp (!) 97.5 F (36.4 C)   Resp 20   Wt 72.9 kg   LMP  (LMP Unknown)   SpO2 100%   BMI 27.59 kg/m   Physical Exam Awake and alert NAD Abdomen soft, NT Right CFA stick site looks good. A tiny amount of blood on the dressing. Groin is soft, no hematoma or pseudoaneurysm.  Imaging: Dg Chest 2 View  Result Date: 07/12/2019 CLINICAL DATA:  Chest pain, missed dialysis EXAM: CHEST - 2 VIEW COMPARISON:  04/01/2019 FINDINGS: Cardiomegaly. Mild, diffuse interstitial opacity and minimal fissural fluid. The visualized skeletal structures are unremarkable. IMPRESSION: Mild, diffuse interstitial pulmonary opacity and minimal fissural fluid, likely edema in the setting of cardiomegaly and dialysis. No focal airspace opacity. Electronically Signed   By: Eddie Candle M.D.   On: 07/12/2019 14:54   Nm Gi Blood Loss  Result Date: 07/13/2019 CLINICAL DATA:  Concern for  gastrointestinal bleeding. Dark stool, nausea and vomiting. Melena EXAM: NUCLEAR MEDICINE GASTROINTESTINAL BLEEDING SCAN TECHNIQUE: Sequential abdominal images were obtained following intravenous administration of Tc-34m labeled red blood cells. RADIOPHARMACEUTICALS:  Twenty mCi Tc-16m pertechnetate in-vitro labeled red cells. COMPARISON:  CT 05/14/2019 FINDINGS: During the first 60 minutes of imaging, focus of radiotracer accumulation develops in the RIGHT mid abdomen in relatively lateral position. Over the second hour imaging, radiotracer is progresses in tubular pattern in the central abdomen and LEFT lateral abdomen. Findings are most consistent with active gastrointestinal bleeding beginning in the hepatic flexure of the colon and blood advancing to the descending colon. IMPRESSION: Active gastrointestinal bleeding originating in the RIGHT colon. These results will be called to the ordering clinician or representative by the Radiologist Assistant, and communication documented in the PACS or zVision Dashboard. Electronically Signed   By: Suzy Bouchard M.D.   On: 07/13/2019 16:54    Labs:  CBC: Recent Labs    05/15/19 1208 07/12/19 1438 07/12/19 1808 07/12/19 1809 07/13/19 1140 07/14/19 0520  WBC 5.7 3.6*  --   --  2.9* 4.9  HGB 7.9* 6.1* 5.8* 6.1* 6.4* 10.0*  HCT 22.8* 19.5* 17.0* 18.0* 20.4* 30.3*  PLT 108* 88*  --   --  75* 83*    COAGS: Recent Labs    11/17/18 1016 12/29/18 0823 03/10/19 1254 07/14/19 0520  INR 1.2 1.3* 1.5* 1.1    BMP: Recent Labs    07/12/19 1438 07/12/19 1753 07/12/19 1808 07/12/19 1809 07/13/19 1140 07/14/19 0520  NA 134* 134* 132* 132* 136 135  K >7.5* 7.1* 7.0* 6.9* 4.0 4.8  CL 90* 97* 105  --  94* 92*  CO2 17* 14*  --   --  25 25  GLUCOSE 221* 253* 239*  --  100* 117*  BUN 150* 136* 131*  --  49* 64*  CALCIUM 9.1 8.0*  --   --  8.2* 8.8*  CREATININE 11.49* 10.26* 11.40*  --  5.29* 6.28*  GFRNONAA 3* 4*  --   --  9* 7*  GFRAA 4* 4*  --    --  10* 8*    LIVER FUNCTION TESTS: Recent Labs    03/11/19 0745  04/01/19 1145 05/13/19 1812 05/14/19 0340 07/12/19 1438  BILITOT 1.2  --  0.8 1.2  --  0.7  AST 44*  --  49* 43*  --  32  ALT 44  --  47* 63*  --  38  ALKPHOS 106  --  106 93  --  78  PROT 6.8  --  6.4* 6.9  --  7.3  ALBUMIN 3.3*   < > 3.3* 3.4* 2.9* 3.6   < > = values in this interval not displayed.    Assessment and Plan:  Acute GI bleed  S/p SMA angio with peripheral Rt colic branch micro coil embo by Dr. Annamaria Boots.  Doing well. Hgb 10.  Will sign off.   Electronically Signed: Murrell Redden, PA-C 07/14/2019, 10:04 AM    I spent a total of 15 Minutes at the the patient's bedside AND on the patient's hospital floor or unit, greater than 50% of which was counseling/coordinating care for f/u embo for GI bleed.

## 2019-07-14 NOTE — Progress Notes (Addendum)
PROGRESS NOTE  [name]  MPN:[id number] DOB: 10/27/1965 DOA: 07/12/2019 PCP: Rocco Serene, MD   Brief Narrative: [name] is a 53 y.o. female with a history of ESRD, T2DM, combined CHF, bipolar and schizoaffective disorder, and recurrent GI bleed with multiple negative work ups in the past who presented with dark stools, nausea and vomiting for 4 days on 12/7 which caused missed HD x2. She was hemodynamically stable with +FOBT melena and external hemorrhoids on exam. Metabolic panel grossly abnormal with potassium >7.5. Hgb noted to be 6.1, platelets 88k, WBC 3.6k. CXR with interstitial edema. 1u PRBCs transfused after confirming patient's consent (Jehovah's Witness), PPI bolused, GI consulted and nephrology consulted. Emergent HD was performed with improvement in metabolic parameters, normalization of potassium. Since she continued to have GI bleeding, tagged scan ordered by GI.  Assessment & Plan: Principal Problem:   Melena Active Problems:   Seizure disorder (HCC)   Hyponatremia   Chronic combined systolic (congestive) and diastolic (congestive) heart failure (HCC)   Essential hypertension   Hyperkalemia   ESRD (end stage renal disease) (HCC)   Volume overload   Type II diabetes mellitus with renal manifestations (HCC)   GERD (gastroesophageal reflux disease)   Tobacco abuse  Acute blood loss anemia on anemia of chronic kidney disease due to recurrent cryptogenic GI bleeding: With multiple negative work ups in the past. Macrocytic indices noted.  Concern for NSAID-induced gastritis given her recet history of ibuprofen use. GI consulted and repeat bleeding scan was positive.  Interventional radiology was consulted and patient underwent angiogram with findings of right colon angiodysplasia with intermittent bleeding and underwent successful coil embolization.  Patient received a total of 3 units of blood packed RBC.  Continue Protonix-changed to p.o.  Advance soft diet..  Will  closely monitor for further bleeding.  ESA as per nephrology.  Continue to monitor hemoglobin.  ESRD: HD M/WF. Complicated by hyperkalemia in the setting of missing HD x2 PTA. - Nephrology consulted. Received HD 12/7. Hyperkalemia resolved.  QT prolongation, peaked T waves in the setting of hyperkalemia which has resolved.  - Continue telemetry.   Chronic combined HFrEF: LVEF 40%. Currently compensated.  Continue with hemodialysis..  Troponin elevation: Demand ischemia, mild, not worsening.  Chest pain.  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 Reportedly had negative esophageal varices screening.  Monitor platelets.  Last platelet count of 83  HLD:  - Hold statin for now given Dx cirrhosis.   Diabetes mellitus Type II: well controlled with HbA1c 5.9%  SSI, holding lantus at this time.  Last POC of 117.  Tobacco use:  -Advised cessation.  Schizoaffective disorder, bipolar disorder: Mood appears stable currently. No psychosis noted.  - Continue SSRI and xanax (confirmed dose in PDMP)  Chronic pain:  - Continue oxycodone   Jehovah's Witness: However agreed on receiving packed RBC this time.  DVT prophylaxis: SCDs  Code Status: Full  Family Communication: I tried to reach the patient's son Jaleeyah Munce on the phone to update him about the medical condition of the patient but was unable to reach him since his phone is not in working condition.  Disposition Plan: Likely home in 1 to 2 days.  Follow hemoglobin serially.  Status post embolization.  Consultants:   [facility]  Procedures:   Superior mesenteric angiography and embolization by IR interventional radiology on 07/14/2019  Antimicrobials:  None   Subjective:  Today, patient stated that she had darker bloody stools- 3 AM this morning.  Had undergone IR intervention this morning.   Denies abdominal pain, nausea, vomiting or shortness of breath.  No fever or chills.  Objective: Vitals:   07/14/19 0033 07/14/19 0312 07/14/19 0500 07/14/19 0615  BP: (!) 184/86 (!) 195/96    Pulse:  86    Resp: 16 18    Temp: 98.4 F (36.9 C) 97.7 F (36.5 C)    TempSrc: Oral Oral    SpO2: 100% 100%    Weight:   72.9 kg 72.9 kg    Intake/Output Summary (Last 24 hours) at 07/14/2019 0719 Last data filed at 07/14/2019 0659 Gross per 24 hour  Intake 410.67 ml  Output -  Net 410.67 ml   Filed Weights   07/14/19 0500 07/14/19 0615  Weight: 72.9 kg 72.9 kg  Body mass index is 27.59 kg/m.   General:  Average built, not in obvious distress HENT: Normocephalic, pupils equally reacting to light and accommodation.  No scleral pallor or icterus noted. Oral mucosa is moist.  Chest:  Clear breath sounds.  Diminished breath sounds bilaterally. No crackles or wheezes.  CVS: S1 &S2 heard. No murmur.  Regular rate and rhythm. Abdomen: Soft, nontender, nondistended.  Bowel sounds are heard.  Liver is not palpable, no abdominal mass palpated Extremities: No cyanosis, clubbing but bilateral lower extremity with mild edema and dressing from superficial abrasions.  Transmetatarsal amputation on the left.  Peripheral pulses are palpable.  Right groin with dressing from recent catheterization. Psych: Alert, awake and oriented, normal mood CNS:  No cranial nerve deficits.  Power equal in all extremities.  No sensory deficits noted.  No cerebellar signs.   Skin: Warm and dry.  No rashes noted.   Data Reviewed: I have personally reviewed following labs and imaging studies  CBC: Recent Labs  Lab 07/12/19 1438 07/12/19 1808 07/12/19 1809 07/13/19 1140 07/14/19 0520  WBC 3.6*  --   --  2.9* 4.9  HGB 6.1* 5.8* 6.1* 6.4* 10.0*  HCT 19.5* 17.0* 18.0* 20.4* 30.3*  MCV 108.9*  --   --  106.8* 98.1  PLT 88*  --   --  75* 83*   Basic Metabolic Panel: Recent Labs  Lab 07/12/19 1438 07/12/19 1753  07/12/19 1808 07/12/19 1809 07/13/19 1140 07/14/19 0520  NA 134* 134* 132* 132* 136 135  K >7.5* 7.1* 7.0* 6.9* 4.0 4.8  CL 90* 97* 105  --  94* 92*  CO2 17* 14*  --   --  25 25  GLUCOSE 221* 253* 239*  --  100* 117*  BUN 150* 136* 131*  --  49* 64*  CREATININE 11.49* 10.26* 11.40*  --  5.29* 6.28*  CALCIUM 9.1 8.0*  --   --  8.2* 8.8*   GFR: Estimated Creatinine Clearance: 10.1 mL/min (A) (by C-G formula based on SCr of 6.28 mg/dL (H)). Liver Function Tests: Recent Labs  Lab 07/12/19 1438  AST 32  ALT 38  ALKPHOS 78  BILITOT 0.7  PROT 7.3  ALBUMIN 3.6   No results for input(s): LIPASE, AMYLASE in the last 168 hours. No results for input(s): AMMONIA in the last 168 hours. Coagulation Profile: Recent Labs  Lab 07/14/19 0520  INR 1.1   Cardiac Enzymes: No results for input(s): CKTOTAL, CKMB, CKMBINDEX, TROPONINI in the last 168 hours. BNP (last 3 results) No results for input(s): PROBNP in the last 8760 hours. HbA1C: Recent  Labs    07/13/19 0205  HGBA1C 5.9*   CBG: Recent Labs  Lab 07/13/19 0303 07/13/19 0749 07/13/19 1223 07/13/19 1736 07/13/19 2335  GLUCAP 165* 78 93 97 100*   Lipid Profile: No results for input(s): CHOL, HDL, LDLCALC, TRIG, CHOLHDL, LDLDIRECT in the last 72 hours. Thyroid Function Tests: No results for input(s): TSH, T4TOTAL, FREET4, T3FREE, THYROIDAB in the last 72 hours. Anemia Panel: No results for input(s): VITAMINB12, FOLATE, FERRITIN, TIBC, IRON, RETICCTPCT in the last 72 hours. Urine analysis:    Component Value Date/Time   COLORURINE YELLOW 02/25/2017 1224   APPEARANCEUR HAZY (A) 02/25/2017 1224   LABSPEC 1.013 02/25/2017 1224   PHURINE 8.0 02/25/2017 1224   GLUCOSEU >=500 (A) 02/25/2017 1224   HGBUR SMALL (A) 02/25/2017 1224   BILIRUBINUR NEGATIVE 02/25/2017 1224   BILIRUBINUR neg 12/13/2014 1127   KETONESUR NEGATIVE 02/25/2017 1224   PROTEINUR >=300 (A) 02/25/2017 1224   UROBILINOGEN 0.2 05/01/2015 1252   NITRITE  NEGATIVE 02/25/2017 1224   LEUKOCYTESUR NEGATIVE 02/25/2017 1224   Recent Results (from the past 240 hour(s))  MRSA PCR Screening     Status: None   Collection Time: 07/14/19 12:58 AM   Specimen: Nasal Mucosa; Nasopharyngeal  Result Value Ref Range Status   MRSA by PCR NEGATIVE NEGATIVE Final    Comment:        The GeneXpert MRSA Assay (FDA approved for NASAL specimens only), is one component of a comprehensive MRSA colonization surveillance program. It is not intended to diagnose MRSA infection nor to guide or monitor treatment for MRSA infections. Performed at [facility], [place] [address] [place], [place] [address]       Radiology Studies: Dg Chest 2 View  Result Date: 07/12/2019 CLINICAL DATA:  Chest pain, missed dialysis EXAM: CHEST - 2 VIEW COMPARISON:  04/01/2019 FINDINGS: Cardiomegaly. Mild, diffuse interstitial opacity and minimal fissural fluid. The visualized skeletal structures are unremarkable. IMPRESSION: Mild, diffuse interstitial pulmonary opacity and minimal fissural fluid, likely edema in the setting of cardiomegaly and dialysis. No focal airspace opacity. Electronically Signed   By: Eddie Candle M.D.   On: 07/12/2019 14:54   Nm Gi Blood Loss  Result Date: 07/13/2019 CLINICAL DATA:  Concern for gastrointestinal bleeding. Dark stool, nausea and vomiting. Melena EXAM: NUCLEAR MEDICINE GASTROINTESTINAL BLEEDING SCAN TECHNIQUE: Sequential abdominal images were obtained following intravenous administration of Tc-62m labeled red blood cells. RADIOPHARMACEUTICALS:  Twenty mCi Tc-61m pertechnetate in-vitro labeled red cells. COMPARISON:  CT 05/14/2019 FINDINGS: During the first 60 minutes of imaging, focus of radiotracer accumulation develops in the RIGHT mid abdomen in relatively lateral position. Over the second hour imaging, radiotracer is progresses in tubular pattern in the central abdomen and LEFT lateral abdomen. Findings are most consistent with active  gastrointestinal bleeding beginning in the hepatic flexure of the colon and blood advancing to the descending colon. IMPRESSION: Active gastrointestinal bleeding originating in the RIGHT colon. These results will be called to the ordering clinician or representative by the Radiologist Assistant, and communication documented in the PACS or zVision Dashboard. Electronically Signed   By: Suzy Bouchard M.D.   On: 07/13/2019 16:54    Scheduled Meds: . allopurinol  200 mg Oral Daily  . amLODipine  2.5 mg Oral Daily  . Chlorhexidine Gluconate Cloth  6 each Topical Q0600  . furosemide  80 mg Oral BID  . insulin aspart  0-5 Units Subcutaneous QHS  . insulin aspart  0-6 Units Subcutaneous TID WC  . lidocaine      . [  START ON 07/16/2019] pantoprazole  40 mg Intravenous Q12H   Continuous Infusions: . sodium chloride 10 mL/hr at 07/13/19 2343  . pantoprozole (PROTONIX) infusion Stopped (07/14/19 0701)     LOS: 2 days   Time spent: 35 minutes.  Flora Lipps, MD [facility]  07/14/2019, 7:19 AM

## 2019-07-14 NOTE — Progress Notes (Signed)
[facility] Progress Note  Subjective: Hb 10, feeling much better today.   Vitals:   07/14/19 0615 07/14/19 0831 07/14/19 1315 07/14/19 1330  BP:  (!) 195/91 (!) 197/93 (!) 192/93  Pulse:  88 88 88  Resp:  20 18 18   Temp:  (!) 97.5 F (36.4 C) 98.3 F (36.8 C)   TempSrc:   Oral   SpO2:  100%    Weight: 72.9 kg  71.8 kg     Inpatient medications: . allopurinol  200 mg Oral Daily  . amLODipine  2.5 mg Oral Daily  . Chlorhexidine Gluconate Cloth  6 each Topical Q0600  . furosemide  80 mg Oral BID  . insulin aspart  0-5 Units Subcutaneous QHS  . insulin aspart  0-6 Units Subcutaneous TID WC  . pantoprazole  40 mg Oral Daily   . sodium chloride 10 mL/hr at 07/13/19 2343   acetaminophen **OR** acetaminophen, albuterol, ALPRAZolam, calcium carbonate (dosed in mg elemental calcium), dextrose, docusate sodium, feeding supplement (NEPRO CARB STEADY), fentaNYL, oxyCODONE, sorbitol, technetium labeled red blood cells, zolpidem    Exam: Gen alert, no distress, lying flat No jvd or bruits Chest clear bilat to bases no rales RRR no MRG Abd soft ntnd no mass or ascites +bs Ext mild LE edema Neuro is alert, Ox 3 , nf L AVG +bruit    Outpt HD: GO MWF  3.5h  73.5kg  P4  AVG  Hep none  - calc 2.5 ug tiw  - mircera 225 q 2, last ?     Assessment/ Plan: 1. GIB / anemia of ABL - Hb up to 10, sp SMA angio with peripheral Rt colic branch micro coil embolization yest 12/8 per IR.   2. Hyperkalemia - severe on admit, resolved w/ HD 3. ESRD - MWF HD.  HD today in progress 4. Anemia ckd - cont esa, get records 5. HTN - vol good, under dry wt now 6. Hep C     Rob Sham Alviar 07/14/2019, 2:20 PM  Iron/TIBC/Ferritin/ %Sat    Component Value Date/Time   IRON 95 04/01/2019 2013   TIBC 326 04/01/2019 2013   FERRITIN 804 (H) 04/01/2019 2013   IRONPCTSAT 29 04/01/2019 2013   Recent Labs  Lab 07/12/19 1438  07/14/19 0520  NA 134*   < > 135  K >7.5*   < > 4.8   CL 90*   < > 92*  CO2 17*   < > 25  GLUCOSE 221*   < > 117*  BUN 150*   < > 64*  CREATININE 11.49*   < > 6.28*  CALCIUM 9.1   < > 8.8*  ALBUMIN 3.6  --   --   INR  --   --  1.1   < > = values in this interval not displayed.   Recent Labs  Lab 07/12/19 1438  AST 32  ALT 38  ALKPHOS 78  BILITOT 0.7  PROT 7.3   Recent Labs  Lab 07/14/19 0520  WBC 4.9  HGB 10.0*  HCT 30.3*  PLT 83*

## 2019-07-14 NOTE — Progress Notes (Signed)
Blood transfusion started with no acute reactions. Will continue to monitor.

## 2019-07-14 NOTE — Progress Notes (Signed)
The patient is refusing to wear the cardiac monitor. Stated it makes her itch and not taking any medical advise. A & O x 4. Baltazar Najjar NP paged to notify and hasn't received a call backt. Will continue to monitor.

## 2019-07-14 NOTE — Progress Notes (Signed)
   07/13/19 2313  Vitals  Temp 97.8 F (36.6 C)  Temp Source Oral  BP (!) 179/84  MAP (mmHg) 111  BP Location Right Wrist  BP Method Automatic  Patient Position (if appropriate) Lying  Pulse Rate 83  Pulse Rate Source Dinamap  Resp 18  Oxygen Therapy  SpO2 98 %  O2 Device Nasal Cannula  MEWS Score  MEWS RR 0  MEWS Pulse 0  MEWS Systolic 0  MEWS LOC 1  MEWS Temp 0  MEWS Score 1  MEWS Score Color Green   The patient is admitted to 2 W 02 with the diagnosis of Melena. A & O x 4. No acute distress noted. The patient is oriented to her room ascom/call Delahunty and staff. Full assessment to epic completed. Will continue to monitor.

## 2019-07-15 LAB — COMPREHENSIVE METABOLIC PANEL
ALT: 45 U/L — ABNORMAL HIGH (ref 0–44)
AST: 54 U/L — ABNORMAL HIGH (ref 15–41)
Albumin: 2.9 g/dL — ABNORMAL LOW (ref 3.5–5.0)
Alkaline Phosphatase: 58 U/L (ref 38–126)
Anion gap: 13 (ref 5–15)
BUN: 28 mg/dL — ABNORMAL HIGH (ref 6–20)
CO2: 26 mmol/L (ref 22–32)
Calcium: 8.2 mg/dL — ABNORMAL LOW (ref 8.9–10.3)
Chloride: 98 mmol/L (ref 98–111)
Creatinine, Ser: 4.13 mg/dL — ABNORMAL HIGH (ref 0.44–1.00)
GFR calc Af Amer: 13 mL/min — ABNORMAL LOW (ref >60)
GFR calc non Af Amer: 12 mL/min — ABNORMAL LOW (ref >60)
Glucose, Bld: 150 mg/dL — ABNORMAL HIGH (ref 70–99)
Potassium: 3.9 mmol/L (ref 3.5–5.1)
Sodium: 137 mmol/L (ref 135–145)
Total Bilirubin: 0.6 mg/dL (ref 0.3–1.2)
Total Protein: 6.1 g/dL — ABNORMAL LOW (ref 6.5–8.1)

## 2019-07-15 LAB — TYPE AND SCREEN
ABO/RH(D): O POS
Antibody Screen: NEGATIVE
Unit division: 0
Unit division: 0
Unit division: 0

## 2019-07-15 LAB — CBC
HCT: 26.4 % — ABNORMAL LOW (ref 36.0–46.0)
Hemoglobin: 8.5 g/dL — ABNORMAL LOW (ref 12.0–15.0)
MCH: 31.8 pg (ref 26.0–34.0)
MCHC: 32.2 g/dL (ref 30.0–36.0)
MCV: 98.9 fL (ref 80.0–100.0)
Platelets: 77 10*3/uL — ABNORMAL LOW (ref 150–400)
RBC: 2.67 MIL/uL — ABNORMAL LOW (ref 3.87–5.11)
RDW: 19.4 % — ABNORMAL HIGH (ref 11.5–15.5)
WBC: 3.2 10*3/uL — ABNORMAL LOW (ref 4.0–10.5)
nRBC: 0 % (ref 0.0–0.2)

## 2019-07-15 LAB — BPAM RBC
Blood Product Expiration Date: 202012312359
Blood Product Expiration Date: 202101092359
Blood Product Expiration Date: 202101092359
ISSUE DATE / TIME: 202012071827
ISSUE DATE / TIME: 202012081656
ISSUE DATE / TIME: 202012090002
Unit Type and Rh: 5100
Unit Type and Rh: 5100
Unit Type and Rh: 5100

## 2019-07-15 LAB — GLUCOSE, CAPILLARY
Glucose-Capillary: 162 mg/dL — ABNORMAL HIGH (ref 70–99)
Glucose-Capillary: 197 mg/dL — ABNORMAL HIGH (ref 70–99)
Glucose-Capillary: 270 mg/dL — ABNORMAL HIGH (ref 70–99)
Glucose-Capillary: 214 mg/dL — ABNORMAL HIGH (ref 70–99)

## 2019-07-15 LAB — MAGNESIUM: Magnesium: 2.2 mg/dL (ref 1.7–2.4)

## 2019-07-15 MED ORDER — HYDRALAZINE HCL 20 MG/ML IJ SOLN: 10.0000 mg | Freq: Four times a day (QID) | INTRAMUSCULAR | Status: DC | PRN

## 2019-07-15 MED ORDER — AMLODIPINE BESYLATE 10 MG PO TABS
10.0000 mg | ORAL_TABLET | Freq: Every day | ORAL | Status: DC
  Administered 2019-07-15: 10 mg via ORAL
  Filled 2019-07-15 (×2): qty 1

## 2019-07-15 MED ORDER — CAMPHOR-MENTHOL 0.5-0.5 % EX LOTN
TOPICAL_LOTION | CUTANEOUS | Status: DC | PRN
  Filled 2019-07-15: qty 222

## 2019-07-15 MED ORDER — LABETALOL HCL 100 MG PO TABS
100.0000 mg | ORAL_TABLET | Freq: Two times a day (BID) | ORAL | Status: DC
  Administered 2019-07-15 – 2019-07-16 (×3): 100 mg via ORAL
  Filled 2019-07-15 (×3): qty 1

## 2019-07-15 MED ORDER — HYDRALAZINE HCL 25 MG PO TABS
25.0000 mg | ORAL_TABLET | Freq: Once | ORAL | Status: AC
  Administered 2019-07-15: 25 mg via ORAL
  Filled 2019-07-15: qty 1

## 2019-07-15 NOTE — Care Management Important Message (Signed)
Important Message  Patient Details  Name: [name] MRN: [id number] Date of Birth: [birth year]   Medicare Important Message Given:  Yes     Julio Storr 07/15/2019, 1:44 PM

## 2019-07-15 NOTE — TOC Initial Note (Signed)
Transition of Care [facility]) - Initial/Assessment Note    Patient Details  Name: [name] MRN: [id number] Date of Birth: 02-14-1966  Transition of Care [facility]) CM/SW Contact:    Sharin Mons, RN Phone Number: 07/15/2019, 9:34 AM  Clinical Narrative:     Admitted with GI Bleed. Hx of  recurrent GI bleed, ESRD/HD, MWF,CHF with EF of 40%, diabetes, diverticulitis, schizoaffective disorder, bipolar disorder, hyperlipidemia,  TIAs.    From home alone. States PTA independent with ADL's, no DME usage.  Pt with high readmission score (57%). Pt will benefit from home health services @ d/c, and pt is agreeable to services. Pt states has used [facility] in the past and had good experience and would like to use them again. NCM made referral with [facility] pending MD's orders.   Pt states she will need @ d/c  transportation home...  TOC will continue to monitor and follow for TOC needs ....  Expected Discharge Plan: [place] Barriers to Discharge: Continued Medical Work up   Patient Goals and CMS Choice   CMS Medicare.gov Compare Post Acute Care list provided to:: Patient Choice offered to / list presented to : Patient  Expected Discharge Plan and Services Expected Discharge Plan: [place]   Discharge Planning Services: CM Consult   Living arrangements for the past 2 months: Single Family Home                           HH Arranged: RN, PT, Nurse's Aide, OT [facility] Agency: Oct[place] ([place]) Date HH Agency Contacted: 07/15/19 Time HH Agency Contacted: [phone]    Prior Living Arrangements/Services Living arrangements for the past 2 months: [place] with:: Self Patient language and need for interpreter reviewed:: Yes Do you feel safe going back to the place where you live?: Yes      Need for Family Participation in Patient Care: Yes (Comment) Care giver support system in place?: No (comment)   Criminal Activity/Legal  Involvement Pertinent to Current Situation/Hospitalization: No - Comment as needed  Activities of Daily Living Home Assistive Devices/Equipment: Cane (specify quad or straight), CBG Meter, Walker (specify type) ADL Screening (condition at time of admission) Patient's cognitive ability adequate to safely complete daily activities?: Yes Is the patient deaf or have difficulty hearing?: No Does the patient have difficulty seeing, even when wearing glasses/contacts?: No Does the patient have difficulty concentrating, remembering, or making decisions?: No Patient able to express need for assistance with ADLs?: Yes Does the patient have difficulty dressing or bathing?: No Independently performs ADLs?: No Communication: Independent Dressing (OT): Needs assistance Is this a change from baseline?: Pre-admission baseline Grooming: Independent Feeding: Independent Bathing: Needs assistance Is this a change from baseline?: Pre-admission baseline Toileting: Needs assistance Is this a change from baseline?: Pre-admission baseline In/Out Bed: Needs assistance Is this a change from baseline?: Pre-admission baseline Walks in Home: Needs assistance Is this a change from baseline?: Pre-admission baseline Does the patient have difficulty walking or climbing stairs?: Yes Weakness of Legs: Both Weakness of Arms/Hands: Both  Permission Sought/Granted Permission sought to share information with : Case Manager    Share Information with NAME: Mija Effertz (Son) [phone]           Emotional Assessment Appearance:: Appears younger than stated age   Affect (typically observed): Accepting Orientation: : Oriented to  Time, Oriented to Place, Oriented to Self Alcohol / Substance Use: Not  Applicable Psych Involvement: No (comment)  Admission diagnosis:  Lower GI bleeding [K92.2] Acute GI bleeding [K92.2] Patient Active Problem List   Diagnosis Date Noted  . Acute GI bleeding 05/13/2019  . DKA  (diabetic ketoacidoses) ([place]) 05/13/2019  . Symptomatic anemia 04/01/2019  . Type II diabetes mellitus with renal manifestations ([place]) 02/25/2019  . TIA (transient ischemic attack) 02/25/2019  . GERD (gastroesophageal reflux disease) 02/25/2019  . Tobacco abuse 02/25/2019  . Fluid overload 02/25/2019  . Uremia 02/25/2019  . Melena   . Rectal bleed 12/30/2018  . Gastritis and gastroduodenitis   . Benign neoplasm of ascending colon   . Benign neoplasm of transverse colon   . Benign neoplasm of descending colon   . GI bleeding 11/17/2018  . Acute blood loss anemia   . No blood products   . GI bleed 10/05/2018  . Prolonged QT interval 10/05/2018  . Localized edema due to fluid overload 09/05/2017  . Lower GI bleed   . Evaluation by psychiatric service required 07/17/2017  . Volume overload 06/03/2017  . Right supracondylar humerus fracture, with nonunion, subsequent encounter 05/01/2017  . Malnutrition of moderate degree 02/28/2017  . Leg pain, left 02/25/2017  . Left leg pain 02/25/2017  . External hemorrhoids   . Diverticulosis of colon without hemorrhage   . Goals of care, counseling/discussion   . Palliative care encounter   . Cellulitis of left leg 01/16/2017  . Upper GI bleed 01/16/2017  . UGIB (upper gastrointestinal bleed)   . Anemia due to chronic kidney disease   . Cellulitis of left thigh 01/01/2017  . ESRD (end stage renal disease) ([place]) 01/01/2017  . Cocaine use disorder, severe, dependence ([place]) 08/29/2016  . Hepatitis C 08/29/2016  . Diabetic ulcer of calf ([place]) 08/29/2016  . Diabetic ulcer of toe ([place]) 08/29/2016  . Cerebrovascular accident (CVA) ([place]) 08/29/2016  . MDD (major depressive disorder) 08/28/2016  . Dyslipidemia associated with type 2 diabetes mellitus ([place]) 11/10/2015  . Hyperkalemia 09/17/2015  . History of vitamin D deficiency 06/20/2015  . Long term prescription opiate use 06/20/2015  . Chronic pain syndrome 06/19/2015  . Anemia of chronic  kidney failure 05/01/2015  . transmetatarsal amputation of the left foot 12/13/2014  . Unilateral visual loss 11/22/2014  . 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 ([place]) 11/22/2014  . Elevated troponin 11/09/2014  . Essential hypertension 11/09/2014  . Dizziness   . Chronic combined systolic (congestive) and diastolic (congestive) heart failure ([place])   . Chest pain 08/25/2014  . Rectal bleeding 04/23/2014  . Anemia, iron deficiency 04/13/2014  . Noncompliance 02/02/2014  . Hyponatremia 05/19/2013  . Seizure disorder ([place]) 02/09/2013   PCP:  Rocco Serene, MD Pharmacy:   [facility] [place], [place] [employer] [address] [place] [address] Phone: [phone] Fax: [phone]     Social Determinants of Health (SDOH) Interventions    Readmission Risk Interventions Readmission Risk Prevention Plan 02/26/2019 01/21/2019 11/17/2018  Transportation Screening Complete Complete -  Medication Review Press photographer) Complete Complete -  PCP or Specialist appointment within 3-5 days of discharge Complete Complete -  [place] or Home Care Consult Complete Complete -  SW Recovery Care/Counseling Consult Complete - Complete  Palliative Care Screening Not Applicable Not Applicable -  [place] Not Applicable Not Applicable -  Some recent data might be hidden

## 2019-07-15 NOTE — Progress Notes (Signed)
[facility] Progress Note  [name] 53 y.o. Dec 05, 1965  CC: Recurrent GI bleed   Subjective: Yesterday's events noted.  Bleeding scan was positive for right colonic bleed.  Underwent IR guided embolization was found to have bleeding in the cecum which was treated with embolization of right colic branch.  Patient had one episode of small bleeding early in the morning but denies any further bowel movements.  Denies abdominal pain.  ROS : Negative for chest pain and shortness of breath   Objective: Vital signs in last 24 hours: Vitals:   07/15/19 0600 07/15/19 0739  BP: (!) 204/90 (!) 174/87  Pulse: 90 87  Resp:  16  Temp:  98.4 F (36.9 C)  SpO2:  100%    Physical Exam:  General.  Well-developed, cooperative, not in acute distress Abdomen.  Soft, nontender, nondistended, bowel sounds present Neuro.  Alert/oriented x3 Psych.  Mood and affect normal Lab Results: Recent Labs    07/14/19 0520 07/15/19 0307  NA 135 137  K 4.8 3.9  CL 92* 98  CO2 25 26  GLUCOSE 117* 150*  BUN 64* 28*  CREATININE 6.28* 4.13*  CALCIUM 8.8* 8.2*  MG  --  2.2   Recent Labs    07/12/19 1438 07/15/19 0307  AST 32 54*  ALT 38 45*  ALKPHOS 78 58  BILITOT 0.7 0.6  PROT 7.3 6.1*  ALBUMIN 3.6 2.9*   Recent Labs    07/14/19 0520 07/15/19 0307  WBC 4.9 3.2*  HGB 10.0* 8.5*  HCT 30.3* 26.4*  MCV 98.1 98.9  PLT 83* 77*   Recent Labs    07/14/19 0520  LABPROT 14.5  INR 1.1      Assessment/Plan: -Recurrent GI bleed.   Bleeding scan was positive for right colonic bleed.  Underwent IR guided embolization was found to have bleeding in the cecum which was treated with embolization of right colic branch.  Multiple EGD, colonoscopy, flexible sigmoidoscopy and capsule endoscopy in the recent past.   flexible sigmoidoscopy in June 2020 showed old blood in the sigmoid colon and rectum.  Bleeding scan negative in March 2020.  CTA negative for active bleeding in August  2020 but did showed cirrhosis of the liver.  -Cirrhosis of the liver based on CT scan findings.  Normal LFTs.  She does have thrombocytopenia.  Recent EGD negative for varices or portal gastropathy. -Chronic hepatitis C genotype 1a.  Positive PCR in June 2020. -End-stage renal disease on dialysis. -History of CHF -History of seasoaffective disorder and bipolar disorder  Recommendations ------------------------- -No further bleeding episodes.  No inpatient GI work-up planned.  Advance diet as tolerated. -Recommend outpatient infectious disease consult for treatment of chronic hepatitis C. -GI will sign off.  Call us back if needed   Otis Brace MD, [place] 07/15/2019, 11:19 AM  Contact #  [phone]

## 2019-07-15 NOTE — Progress Notes (Signed)
[facility] paged patient bp 203/94 HR 82 symptomatic with headache.

## 2019-07-15 NOTE — Progress Notes (Signed)
[facility] Progress Note  Subjective: Hb 8.5 today. Plan is dc tomorrow if HB stable per pt.   Vitals:   07/15/19 0326 07/15/19 0425 07/15/19 0600 07/15/19 0739  BP: (!) 190/88 (!) 203/94 (!) 204/90 (!) 174/87  Pulse: 92  90 87  Resp: 18   16  Temp: 99 F (37.2 C)   98.4 F (36.9 C)  TempSrc: Oral   Oral  SpO2: 100%   100%  Weight:        Inpatient medications: . allopurinol  200 mg Oral Daily  . amLODipine  10 mg Oral Daily  . Chlorhexidine Gluconate Cloth  6 each Topical Q0600  . furosemide  80 mg Oral BID  . insulin aspart  0-5 Units Subcutaneous QHS  . insulin aspart  0-6 Units Subcutaneous TID WC  . labetalol  100 mg Oral BID  . pantoprazole  40 mg Oral Daily   . sodium chloride 10 mL/hr at 07/13/19 2343   acetaminophen **OR** acetaminophen, albuterol, ALPRAZolam, calcium carbonate (dosed in mg elemental calcium), camphor-menthol, dextrose, docusate sodium, feeding supplement (NEPRO CARB STEADY), fentaNYL, hydrALAZINE, oxyCODONE, sorbitol, technetium labeled red blood cells, zolpidem    Exam: Gen alert, no distress, lying flat No jvd or bruits Chest clear bilat to bases no rales RRR no MRG Abd soft ntnd no mass or ascites +bs Ext mild LE edema Neuro is alert, Ox 3 , nf L AVG +bruit    Outpt HD: GO MWF  3.5h  73.5kg  P4  AVG  Hep none  - calc 2.5 ug tiw  - mircera 225 q 2, last ?     Assessment/ Plan: 1. GIB / anemia of ABL - Hb up to 10, sp SMA angio with peripheral Rt colic branch micro coil embolization yest 12/8 per IR.  Hb down a bit 8.5 today, for f/u labs and poss dc tomorrow if Hb stable.  2. Hyperkalemia - severe on admit, resolved w/ HD 3. ESRD - MWF HD.  Next HD Friday. If able to dc pt agreeable to dc and go to OP HD, she is 2nd shift patient starts at noon.  4. Anemia ckd - cont esa, get records 5. HTN - vol good, under dry wt now, lower dry on dc 6. Hep C     Rob Mahlet Jergens 07/15/2019, 2:48 PM  Iron/TIBC/Ferritin/  %Sat    Component Value Date/Time   IRON 95 04/01/2019 2013   TIBC 326 04/01/2019 2013   FERRITIN 804 (H) 04/01/2019 2013   IRONPCTSAT 29 04/01/2019 2013   Recent Labs  Lab 07/12/19 1438 07/14/19 0520 07/15/19 0307  NA   < > 135 137  K   < > 4.8 3.9  CL   < > 92* 98  CO2   < > 25 26  GLUCOSE   < > 117* 150*  BUN   < > 64* 28*  CREATININE   < > 6.28* 4.13*  CALCIUM   < > 8.8* 8.2*  ALBUMIN  --   --  2.9*  INR  --  1.1  --    < > = values in this interval not displayed.   Recent Labs  Lab 07/15/19 0307  AST 54*  ALT 45*  ALKPHOS 58  BILITOT 0.6  PROT 6.1*   Recent Labs  Lab 07/15/19 0307  WBC 3.2*  HGB 8.5*  HCT 26.4*  PLT 77*

## 2019-07-15 NOTE — Progress Notes (Signed)
Renal Navigator notified by Nephrologist that patient may be ready for discharge tomorrow, which is her regular HD day. Renal Navigator requested of Attending that patient be evaluated on the earlier side tomorrow, so if cleared, she can be discharged to her OP HD clinic/[facility] for treatment tomorrow.  Renal Navigator spoke with patient to discuss. She is only concerned that she does not have socks. Renal Navigator offered socks and a blanket, which patient accepted and was delighted by. Renal Navigator asked if patient can ride in a regular car and she agreed. Renal Navigator can arrange [facility] [employer] to have Uber/Lyft take patient to OP HD clinic and [employer] take her home. Patient likes this plan. Navigator to re-assess in the am and meet with patient tomorrow morning.  Alphonzo Cruise, [place] Renal Navigator [phone]

## 2019-07-15 NOTE — Progress Notes (Signed)
[facility] notified that patient BP 190/88 hr 92 asymptomatic.Arthor Captain LPN

## 2019-07-15 NOTE — Progress Notes (Signed)
[facility] paged repeat bp 203/94 HR 82. Arthor Captain LPN

## 2019-07-15 NOTE — Progress Notes (Addendum)
PROGRESS NOTE  [name]  ASN:[id number] DOB: 1966/06/24 DOA: 07/12/2019 PCP: Rocco Serene, MD   Brief Narrative: [name] is a 53 y.o. female with a history of ESRD, T2DM, combined CHF, bipolar and schizoaffective disorder, and recurrent GI bleed with multiple negative work ups in the past who presented with dark stools, nausea and vomiting for 4 days on 12/7 which caused missed HD x2. She was hemodynamically stable with +FOBT melena and external hemorrhoids on exam. Metabolic panel grossly abnormal with potassium >7.5. Hgb noted to be 6.1, platelets 88k, WBC 3.6k. CXR with interstitial edema. 1u PRBCs transfused after confirming patient's consent (Jehovah's Witness), PPI bolused, GI consulted and nephrology consulted. Emergent HD was performed with improvement in metabolic parameters, normalization of potassium. Since she continued to have GI bleeding, tagged scan ordered by GI.  RBC scan was positive and patient underwent IR guided coil embolization.  Assessment & Plan: Principal Problem:   Melena Active Problems:   Seizure disorder (HCC)   Hyponatremia   Chronic combined systolic (congestive) and diastolic (congestive) heart failure (HCC)   Essential hypertension   Hyperkalemia   ESRD (end stage renal disease) (HCC)   Volume overload   Type II diabetes mellitus with renal manifestations (HCC)   GERD (gastroesophageal reflux disease)   Tobacco abuse  Acute blood loss anemia on anemia of chronic kidney disease due to recurrent cryptogenic GI bleeding: With multiple negative work ups in the past. Macrocytic indices noted.  Concern for NSAID-induced gastritis given her recet history of ibuprofen use. GI consulted and repeat bleeding scan was positive.  Interventional radiology was consulted and patient underwent angiogram with findings of right colon angiodysplasia with intermittent bleeding and underwent successful coil embolization.  Patient received a total of 3 units of blood  packed RBC during this hospitalization..  Continue Protonix.  Soft diet.  Will closely monitor for further bleeding.  Hemoglobin of 8.5 today from 10.0 yesterday.  Patient however has not had a bowel movement or bleeding since yesterday.   Continue to monitor hemoglobin.  ESRD: HD M/WF. Complicated by hyperkalemia in the setting of missing HD x2 PTA. - Nephrology on board received hemodialysis.  Hyperkalemia has resolved.  QT prolongation, peaked T waves in the setting of hyperkalemia which has resolved.  - Continue telemetry.   Chronic combined HFrEF: LVEF 40%. Currently compensated.  Continue with hemodialysis..  Troponin elevation: Demand ischemia, mild, not worsening.  No chest pain.  No further work-up necessary  Accelerated HTN:  - Manage volume per nephrology, Lasix and amlodipine as outpatient.  Patient states that she is takes labetalol at home but is not on her medication list.  Will increase the dose of amlodipine and resume oral labetalol today.  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 Reportedly had negative esophageal varices screening.  Monitor platelets.  Last platelet count of 77  HLD:  - Hold statin for now given Dx cirrhosis.   Diabetes mellitus Type II: well controlled with HbA1c 5.9%  SSI, holding lantus at this time.  Last POC of 117.  Patient was on Lantus 24 units at bedtime at home.  Tobacco use:  -Advised cessation.  Schizoaffective disorder, bipolar disorder: Mood appears stable currently. No psychosis noted.  - Continue SSRI and xanax (confirmed dose in PDMP)  Chronic pain:  - Continue oxycodone   Jehovah's Witness: However agreed on receiving packed RBC this time.  DVT prophylaxis: SCDs  Code Status: Full  Family Communication: None today.  Disposition Plan: Likely home by tomorrow if hemoglobin remains stable no further bleeding and blood pressure is stable.  Follow hemoglobin  serially.  Status post embolization.  Check CBC in a.m.  Closely monitor blood pressure.  Follow GI recommendations as well.  Consultants:  [facility]  Procedures:  Superior mesenteric angiography and embolization by IR interventional radiology on 07/14/2019  Antimicrobials: None   Subjective:  Today, patient feels okay.  Denies any dizziness, lightheadedness, chest pain.  Has not had a bowel movement since yesterday hemodialysis.  Drop in hemoglobin noted in the morning CBC.  Objective: Vitals:   07/15/19 0326 07/15/19 0425 07/15/19 0600 07/15/19 0739  BP: (!) 190/88 (!) 203/94 (!) 204/90 (!) 174/87  Pulse: 92  90 87  Resp: 18   16  Temp: 99 F (37.2 C)   98.4 F (36.9 C)  TempSrc: Oral   Oral  SpO2: 100%   100%  Weight:        Intake/Output Summary (Last 24 hours) at 07/15/2019 1040 Last data filed at 07/15/2019 0800 Gross per 24 hour  Intake 480 ml  Output 2284 ml  Net -1804 ml   Filed Weights   07/14/19 0615 07/14/19 1315 07/14/19 1640  Weight: 72.9 kg 71.8 kg 69.8 kg  Body mass index is 26.41 kg/m.   General:  Average built, not in obvious distress HENT: Normocephalic, pupils equally reacting to light and accommodation.  Mild pallor noted.  Oral mucosa is moist.  Chest:  Clear breath sounds.  Diminished breath sounds bilaterally. No crackles or wheezes.  CVS: S1 &S2 heard. No murmur.  Regular rate and rhythm. Abdomen: Soft, nontender, nondistended.  Bowel sounds are heard.  Liver is not palpable, no abdominal mass palpated Extremities: No cyanosis, clubbing but bilateral lower extremity with mild edema and superficial skin ulcerations.  Transmetatarsal amputation on the left.  Peripheral pulses are palpable.  Right groin catheter site normal. Psych: Alert, awake and oriented, normal mood CNS:  No cranial nerve deficits.  Power equal in all extremities.  No sensory deficits noted.  No cerebellar signs.   Skin: Bilateral lower extremity superficial  ulcerations.   Data Reviewed: I have personally reviewed following labs and imaging studies  CBC: Recent Labs  Lab 07/12/19 1438 07/12/19 1808 07/12/19 1809 07/13/19 1140 07/14/19 0520 07/15/19 0307  WBC 3.6*  --   --  2.9* 4.9 3.2*  HGB 6.1* 5.8* 6.1* 6.4* 10.0* 8.5*  HCT 19.5* 17.0* 18.0* 20.4* 30.3* 26.4*  MCV 108.9*  --   --  106.8* 98.1 98.9  PLT 88*  --   --  75* 83* 77*   Basic Metabolic Panel: Recent Labs  Lab 07/12/19 1438 07/12/19 1753 07/12/19 1808 07/12/19 1809 07/13/19 1140 07/14/19 0520 07/15/19 0307  NA 134* 134* 132* 132* 136 135 137  K >7.5* 7.1* 7.0* 6.9* 4.0 4.8 3.9  CL 90* 97* 105  --  94* 92* 98  CO2 17* 14*  --   --  25 25 26   GLUCOSE 221* 253* 239*  --  100* 117* 150*  BUN 150* 136* 131*  --  49* 64* 28*  CREATININE 11.49* 10.26* 11.40*  --  5.29* 6.28* 4.13*  CALCIUM 9.1 8.0*  --   --  8.2* 8.8* 8.2*  MG  --   --   --   --   --   --  2.2   GFR: Estimated Creatinine Clearance: 15.1 mL/min (A) (by C-G formula based on  SCr of 4.13 mg/dL (H)). Liver Function Tests: Recent Labs  Lab 07/12/19 1438 07/15/19 0307  AST 32 54*  ALT 38 45*  ALKPHOS 78 58  BILITOT 0.7 0.6  PROT 7.3 6.1*  ALBUMIN 3.6 2.9*   No results for input(s): LIPASE, AMYLASE in the last 168 hours. No results for input(s): AMMONIA in the last 168 hours. Coagulation Profile: Recent Labs  Lab 07/14/19 0520  INR 1.1   Cardiac Enzymes: No results for input(s): CKTOTAL, CKMB, CKMBINDEX, TROPONINI in the last 168 hours. BNP (last 3 results) No results for input(s): PROBNP in the last 8760 hours. HbA1C: Recent Labs    07/13/19 0205  HGBA1C 5.9*   CBG: Recent Labs  Lab 07/14/19 0719 07/14/19 1158 07/14/19 1744 07/14/19 2202 07/15/19 0739  GLUCAP 115* 130* 273* 180* 162*   Lipid Profile: No results for input(s): CHOL, HDL, LDLCALC, TRIG, CHOLHDL, LDLDIRECT in the last 72 hours. Thyroid Function Tests: No results for input(s): TSH, T4TOTAL, FREET4, T3FREE,  THYROIDAB in the last 72 hours. Anemia Panel: No results for input(s): VITAMINB12, FOLATE, FERRITIN, TIBC, IRON, RETICCTPCT in the last 72 hours. Urine analysis:    Component Value Date/Time   COLORURINE YELLOW 02/25/2017 1224   APPEARANCEUR HAZY (A) 02/25/2017 1224   LABSPEC 1.013 02/25/2017 1224   PHURINE 8.0 02/25/2017 1224   GLUCOSEU >=500 (A) 02/25/2017 1224   HGBUR SMALL (A) 02/25/2017 1224   BILIRUBINUR NEGATIVE 02/25/2017 1224   BILIRUBINUR neg 12/13/2014 1127   KETONESUR NEGATIVE 02/25/2017 1224   PROTEINUR >=300 (A) 02/25/2017 1224   UROBILINOGEN 0.2 05/01/2015 1252   NITRITE NEGATIVE 02/25/2017 1224   LEUKOCYTESUR NEGATIVE 02/25/2017 1224   Recent Results (from the past 240 hour(s))  MRSA PCR Screening     Status: None   Collection Time: 07/14/19 12:58 AM   Specimen: Nasal Mucosa; Nasopharyngeal  Result Value Ref Range Status   MRSA by PCR NEGATIVE NEGATIVE Final    Comment:        The GeneXpert MRSA Assay (FDA approved for NASAL specimens only), is one component of a comprehensive MRSA colonization surveillance program. It is not intended to diagnose MRSA infection nor to guide or monitor treatment for MRSA infections. Performed at [facility], [place] [address] [place], [place] [address]       Radiology Studies: NM GI Blood Loss  Result Date: 07/13/2019 CLINICAL DATA:  Concern for gastrointestinal bleeding. Dark stool, nausea and vomiting. Melena EXAM: NUCLEAR MEDICINE GASTROINTESTINAL BLEEDING SCAN TECHNIQUE: Sequential abdominal images were obtained following intravenous administration of Tc-16m labeled red blood cells. RADIOPHARMACEUTICALS:  Twenty mCi Tc-76m pertechnetate in-vitro labeled red cells. COMPARISON:  CT 05/14/2019 FINDINGS: During the first 60 minutes of imaging, focus of radiotracer accumulation develops in the RIGHT mid abdomen in relatively lateral position. Over the second hour imaging, radiotracer is progresses in tubular pattern in  the central abdomen and LEFT lateral abdomen. Findings are most consistent with active gastrointestinal bleeding beginning in the hepatic flexure of the colon and blood advancing to the descending colon. IMPRESSION: Active gastrointestinal bleeding originating in the RIGHT colon. These results will be called to the ordering clinician or representative by the Radiologist Assistant, and communication documented in the PACS or zVision Dashboard. Electronically Signed   By: Suzy Bouchard M.D.   On: 07/13/2019 16:54   IR Angiogram Visceral Selective  Result Date: 07/14/2019 INDICATION: End-stage renal disease, recurrent active lower GI bleeding, anemia requiring transfusions EXAM: Ultrasound guidance for vascular access SMA, right colic, and 3  additional peripheral right colic branch catheterizations and angiograms Micro coil embolization of 2 peripheral right colic branches at the site of right colonic angio dysplasia with intermittent bleeding. (Site correlates with the nuclear medicine GI bleeding scan) MEDICATIONS: 1% lidocaine local. ANESTHESIA/SEDATION: Moderate (conscious) sedation was employed during this procedure. A total of Versed 2.0 mg and Fentanyl 100 mcg was administered intravenously. Moderate Sedation Time: 50 minutes. The patient's level of consciousness and vital signs were monitored continuously by radiology nursing throughout the procedure under my direct supervision. CONTRAST:  75 cc FLUOROSCOPY TIME:  Fluoroscopy Time: 11 minutes 6 seconds (576 mGy). COMPLICATIONS: None immediate. PROCEDURE: Informed consent was obtained from the patient following explanation of the procedure, risks, benefits and alternatives. The patient understands, agrees and consents for the procedure. All questions were addressed. A time out was performed prior to the initiation of the procedure. Maximal barrier sterile technique utilized including caps, mask, sterile gowns, sterile gloves, large sterile drape, hand  hygiene, and Betadine prep. Under sterile conditions and local anesthesia, ultrasound micropuncture access performed right common artery. Ultrasound images obtained documentation of the patent right common femoral artery. Five French sheath inserted over a guidewire. C2 catheter advanced to the SMA over a Glidewire. SMA angiogram performed. SMA: Atherosclerotic changes noted with wall calcification. SMA is widely patent. The main SMA trunk including the jejunal and colic branches are all patent. In the right lower quadrant, there are hypertrophied tortuous peripheral right colic descending and ascending branches with intermittent areas of mild bleeding/oozing noted with a prominent enlarged draining vein in this region. Appearance is compatible with right colonic angiodysplasia. Through the C2 catheter, a Renegade STC catheter was advanced into the right colic artery. This was performed over a GT Glidewire. Right colic angiogram performed. Right colic: The main right colic artery is patent. Peripheral right colic ascending and descending branches are patent. Again, from 2 of the ascending right colic branches there is tortuous hypervascularity with intermittent oozing/bleeding noted and a prominent early draining vein. This appears to be the dominant vessels to the right colon angiodysplasia. Renegade STC microcatheter was advanced into the right colic descending branch. Right colic descending branch: The descending branch appears patent without significant supply to the area of angio dysplasia. Descending branch vascular anatomy appears normal without hypervascularity, tortuosity, bleeding or early venous drainage. Microcatheter was retracted into the adjacent ascending right colic branch. Peripheral angiogram performed. Right colic ascending branch: There are 2 adjacent enlarged right colic ascending branches with associated tortuous hypervascularity, colonic wall staining, and early draining venous anatomy  consistent with angiodysplasia. Right colic peripheral branch micro coil embolization: Two and 3 mm interlock coils in length of 4 and 6 mm successfully deployed in 2 adjacent branches of a peripheral right colic ascending artery at the site of angiodysplasia. Following micro coil embolization of these 2 adjacent small branches, repeat injection demonstrates significant reduction in arterial vascular flow throughout the network of tortuous vessels related to angiodysplasia. After approximately 5 minutes repeat injection of the peripheral right colic artery again confirms reduction in arterial vascular flow to the area hypervascularity. No further intermittent oozing or bleeding appreciated. At this point the procedure was stopped. Access removed. Hemostasis obtained the right femoral puncture site with ExoSeal device successfully. No immediate complication. Patient tolerated the procedure well. 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 Successful micro coil embolization of 2 right colic peripheral branches at the site of dominant arterial supply to the angiodysplasia. Electronically Signed   By:  M.  Shick M.D.   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 (Site correlates with the nuclear medicine GI bleeding scan) MEDICATIONS: 1% lidocaine local. ANESTHESIA/SEDATION: Moderate (conscious) sedation was employed during this procedure. A total of Versed 2.0 mg and Fentanyl 100 mcg was administered intravenously. Moderate Sedation  Time: 50 minutes. The patient's level of consciousness and vital signs were monitored continuously by radiology nursing throughout the procedure under my direct supervision. CONTRAST:  75 cc FLUOROSCOPY TIME:  Fluoroscopy Time: 11 minutes 6 seconds (576 mGy). COMPLICATIONS: None immediate. PROCEDURE: Informed consent was obtained from the patient following explanation of the procedure, risks, benefits and alternatives. The patient understands, agrees and consents for the procedure. All questions were addressed. A time out was performed prior to the initiation of the procedure. Maximal barrier sterile technique utilized including caps, mask, sterile gowns, sterile gloves, large sterile drape, hand hygiene, and Betadine prep. Under sterile conditions and local anesthesia, ultrasound micropuncture access performed right common artery. Ultrasound images obtained documentation of the patent right common femoral artery. Five French sheath inserted over a guidewire. C2 catheter advanced to the SMA over a Glidewire. SMA angiogram performed. SMA: Atherosclerotic changes noted with wall calcification. SMA is widely patent. The main SMA trunk including the jejunal and colic branches are all patent. In the right lower quadrant, there are hypertrophied tortuous peripheral right colic descending and ascending branches with intermittent areas of mild bleeding/oozing noted with a prominent enlarged draining vein in this region. Appearance is compatible with right colonic angiodysplasia. Through the C2 catheter, a Renegade STC catheter was advanced into the right colic artery. This was performed over a GT Glidewire. Right colic angiogram performed. Right colic: The main right colic artery is patent. Peripheral right colic ascending and descending branches are patent. Again, from 2 of the ascending right colic branches there is tortuous hypervascularity with intermittent oozing/bleeding noted and a prominent early draining vein. This  appears to be the dominant vessels to the right colon angiodysplasia. Renegade STC microcatheter was advanced into the right colic descending branch. Right colic descending branch: The descending branch appears patent without significant supply to the area of angio dysplasia. Descending branch vascular anatomy appears normal without hypervascularity, tortuosity, bleeding or early venous drainage. Microcatheter was retracted into the adjacent ascending right colic branch. Peripheral angiogram performed. Right colic ascending branch: There are 2 adjacent enlarged right colic ascending branches with associated tortuous hypervascularity, colonic wall staining, and early draining venous anatomy consistent with angiodysplasia. Right colic peripheral branch micro coil embolization: Two and 3 mm interlock coils in length of 4 and 6 mm successfully deployed in 2 adjacent branches of a peripheral right colic ascending artery at the site of angiodysplasia. Following micro coil embolization of these 2 adjacent small branches, repeat injection demonstrates significant reduction in arterial vascular flow throughout the network of tortuous vessels related to angiodysplasia. After approximately 5 minutes repeat injection of the peripheral right colic artery again confirms reduction in arterial vascular flow to the area hypervascularity. No further intermittent oozing or bleeding appreciated. At this point the procedure was stopped. Access removed. Hemostasis obtained the right femoral puncture site with ExoSeal device successfully.  No immediate complication. Patient tolerated the procedure well. 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 Successful micro coil embolization of 2 right colic peripheral branches at the site of dominant arterial supply to the angiodysplasia. Electronically Signed   By: Jerilynn Mages.  Shick M.D.   On: 07/14/2019 11:00   IR Angiogram Selective Each  Additional Vessel  Result Date: 07/14/2019 INDICATION: End-stage renal disease, recurrent active lower GI bleeding, anemia requiring transfusions EXAM: Ultrasound guidance for vascular access SMA, right colic, and 3 additional peripheral right colic branch catheterizations and angiograms Micro coil embolization of 2 peripheral right colic branches at the site of right colonic angio dysplasia with intermittent bleeding. (Site correlates with the nuclear medicine GI bleeding scan) MEDICATIONS: 1% lidocaine local. ANESTHESIA/SEDATION: Moderate (conscious) sedation was employed during this procedure. A total of Versed 2.0 mg and Fentanyl 100 mcg was administered intravenously. Moderate Sedation Time: 50 minutes. The patient's level of consciousness and vital signs were monitored continuously by radiology nursing throughout the procedure under my direct supervision. CONTRAST:  75 cc FLUOROSCOPY TIME:  Fluoroscopy Time: 11 minutes 6 seconds (576 mGy). COMPLICATIONS: None immediate. PROCEDURE: Informed consent was obtained from the patient following explanation of the procedure, risks, benefits and alternatives. The patient understands, agrees and consents for the procedure. All questions were addressed. A time out was performed prior to the initiation of the procedure. Maximal barrier sterile technique utilized including caps, mask, sterile gowns, sterile gloves, large sterile drape, hand hygiene, and Betadine prep. Under sterile conditions and local anesthesia, ultrasound micropuncture access performed right common artery. Ultrasound images obtained documentation of the patent right common femoral artery. Five French sheath inserted over a guidewire. C2 catheter advanced to the SMA over a Glidewire. SMA angiogram performed. SMA: Atherosclerotic changes noted with wall calcification. SMA is widely patent. The main SMA trunk including the jejunal and colic branches are all patent. In the right lower quadrant, there are  hypertrophied tortuous peripheral right colic descending and ascending branches with intermittent areas of mild bleeding/oozing noted with a prominent enlarged draining vein in this region. Appearance is compatible with right colonic angiodysplasia. Through the C2 catheter, a Renegade STC catheter was advanced into the right colic artery. This was performed over a GT Glidewire. Right colic angiogram performed. Right colic: The main right colic artery is patent. Peripheral right colic ascending and descending branches are patent. Again, from 2 of the ascending right colic branches there is tortuous hypervascularity with intermittent oozing/bleeding noted and a prominent early draining vein. This appears to be the dominant vessels to the right colon angiodysplasia. Renegade STC microcatheter was advanced into the right colic descending branch. Right colic descending branch: The descending branch appears patent without significant supply to the area of angio dysplasia. Descending branch vascular anatomy appears normal without hypervascularity, tortuosity, bleeding or early venous drainage. Microcatheter was retracted into the adjacent ascending right colic branch. Peripheral angiogram performed. Right colic ascending branch: There are 2 adjacent enlarged right colic ascending branches with associated tortuous hypervascularity, colonic wall staining, and early draining venous anatomy consistent with angiodysplasia. Right colic peripheral branch micro coil embolization: Two and 3 mm interlock coils in length of 4 and 6 mm successfully deployed in 2 adjacent branches of a peripheral right colic ascending artery at the site of angiodysplasia. Following micro coil embolization of these 2 adjacent small branches, repeat injection demonstrates significant reduction in arterial vascular flow throughout the network of tortuous vessels related to angiodysplasia. After approximately 5 minutes repeat injection  of the peripheral  right colic artery again confirms reduction in arterial vascular flow to the area hypervascularity. No further intermittent oozing or bleeding appreciated. At this point the procedure was stopped. Access removed. Hemostasis obtained the right femoral puncture site with ExoSeal device successfully. No immediate complication. Patient tolerated the procedure well. 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 Successful micro coil embolization of 2 right colic peripheral branches at the site of dominant arterial supply to the angiodysplasia. Electronically Signed   By: Jerilynn Mages.  Shick M.D.   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 (Site correlates with the nuclear medicine GI bleeding scan) MEDICATIONS: 1% lidocaine local. ANESTHESIA/SEDATION: Moderate (conscious) sedation was employed during this procedure. A total of Versed 2.0 mg and Fentanyl 100 mcg was administered intravenously. Moderate Sedation Time: 50 minutes. The patient's level of consciousness and vital signs were monitored continuously by radiology nursing throughout the procedure under my direct supervision. CONTRAST:  75 cc FLUOROSCOPY TIME:  Fluoroscopy Time: 11 minutes 6 seconds (576 mGy). COMPLICATIONS: None immediate. PROCEDURE: Informed consent was obtained from the patient following explanation of the procedure, risks, benefits and alternatives. The patient understands, agrees and consents for the procedure. All questions were addressed. A time out was performed  prior to the initiation of the procedure. Maximal barrier sterile technique utilized including caps, mask, sterile gowns, sterile gloves, large sterile drape, hand hygiene, and Betadine prep. Under sterile conditions and local anesthesia, ultrasound micropuncture access performed right common artery. Ultrasound images obtained documentation of the patent right common femoral artery. Five French sheath inserted over a guidewire. C2 catheter advanced to the SMA over a Glidewire. SMA angiogram performed. SMA: Atherosclerotic changes noted with wall calcification. SMA is widely patent. The main SMA trunk including the jejunal and colic branches are all patent. In the right lower quadrant, there are hypertrophied tortuous peripheral right colic descending and ascending branches with intermittent areas of mild bleeding/oozing noted with a prominent enlarged draining vein in this region. Appearance is compatible with right colonic angiodysplasia. Through the C2 catheter, a Renegade STC catheter was advanced into the right colic artery. This was performed over a GT Glidewire. Right colic angiogram performed. Right colic: The main right colic artery is patent. Peripheral right colic ascending and descending branches are patent. Again, from 2 of the ascending right colic branches there is tortuous hypervascularity with intermittent oozing/bleeding noted and a prominent early draining vein. This appears to be the dominant vessels to the right colon angiodysplasia. Renegade STC microcatheter was advanced into the right colic descending branch. Right colic descending branch: The descending branch appears patent without significant supply to the area of angio dysplasia. Descending branch vascular anatomy appears normal without hypervascularity, tortuosity, bleeding or early venous drainage. Microcatheter was retracted into the adjacent ascending right colic branch. Peripheral angiogram performed. Right colic ascending branch:  There are 2 adjacent enlarged right colic ascending branches with associated tortuous hypervascularity, colonic wall staining, and early draining venous anatomy consistent with angiodysplasia. Right colic peripheral branch micro coil embolization: Two and 3 mm interlock coils in length of 4 and 6 mm successfully deployed in 2 adjacent branches of  a peripheral right colic ascending artery at the site of angiodysplasia. Following micro coil embolization of these 2 adjacent small branches, repeat injection demonstrates significant reduction in arterial vascular flow throughout the network of tortuous vessels related to angiodysplasia. After approximately 5 minutes repeat injection of the peripheral right colic artery again confirms reduction in arterial vascular flow to the area hypervascularity. No further intermittent oozing or bleeding appreciated. At this point the procedure was stopped. Access removed. Hemostasis obtained the right femoral puncture site with ExoSeal device successfully. No immediate complication. Patient tolerated the procedure well. 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 Successful micro coil embolization of 2 right colic peripheral branches at the site of dominant arterial supply to the angiodysplasia. Electronically Signed   By: Jerilynn Mages.  Shick M.D.   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 (Site correlates with the nuclear medicine GI bleeding scan) MEDICATIONS: 1% lidocaine local. ANESTHESIA/SEDATION:  Moderate (conscious) sedation was employed during this procedure. A total of Versed 2.0 mg and Fentanyl 100 mcg was administered intravenously. Moderate Sedation Time: 50 minutes. The patient's level of consciousness and vital signs were monitored continuously by radiology nursing throughout the procedure under my direct supervision. CONTRAST:  75 cc FLUOROSCOPY TIME:  Fluoroscopy Time: 11 minutes 6 seconds (576 mGy). COMPLICATIONS: None immediate. PROCEDURE: Informed consent was obtained from the patient following explanation of the procedure, risks, benefits and alternatives. The patient understands, agrees and consents for the procedure. All questions were addressed. A time out was performed prior to the initiation of the procedure. Maximal barrier sterile technique utilized including caps, mask, sterile gowns, sterile gloves, large sterile drape, hand hygiene, and Betadine prep. Under sterile conditions and local anesthesia, ultrasound micropuncture access performed right common artery. Ultrasound images obtained documentation of the patent right common femoral artery. Five French sheath inserted over a guidewire. C2 catheter advanced to the SMA over a Glidewire. SMA angiogram performed. SMA: Atherosclerotic changes noted with wall calcification. SMA is widely patent. The main SMA trunk including the jejunal and colic branches are all patent. In the right lower quadrant, there are hypertrophied tortuous peripheral right colic descending and ascending branches with intermittent areas of mild bleeding/oozing noted with a prominent enlarged draining vein in this region. Appearance is compatible with right colonic angiodysplasia. Through the C2 catheter, a Renegade STC catheter was advanced into the right colic artery. This was performed over a GT Glidewire. Right colic angiogram performed. Right colic: The main right colic artery is patent. Peripheral right colic ascending and descending branches are patent. Again,  from 2 of the ascending right colic branches there is tortuous hypervascularity with intermittent oozing/bleeding noted and a prominent early draining vein. This appears to be the dominant vessels to the right colon angiodysplasia. Renegade STC microcatheter was advanced into the right colic descending branch. Right colic descending branch: The descending branch appears patent without significant supply to the area of angio dysplasia. Descending branch vascular anatomy appears normal without hypervascularity, tortuosity, bleeding or early venous drainage. Microcatheter was retracted into the adjacent ascending right colic branch. Peripheral angiogram performed. Right colic ascending branch: There are 2 adjacent enlarged right colic  ascending branches with associated tortuous hypervascularity, colonic wall staining, and early draining venous anatomy consistent with angiodysplasia. Right colic peripheral branch micro coil embolization: Two and 3 mm interlock coils in length of 4 and 6 mm successfully deployed in 2 adjacent branches of a peripheral right colic ascending artery at the site of angiodysplasia. Following micro coil embolization of these 2 adjacent small branches, repeat injection demonstrates significant reduction in arterial vascular flow throughout the network of tortuous vessels related to angiodysplasia. After approximately 5 minutes repeat injection of the peripheral right colic artery again confirms reduction in arterial vascular flow to the area hypervascularity. No further intermittent oozing or bleeding appreciated. At this point the procedure was stopped. Access removed. Hemostasis obtained the right femoral puncture site with ExoSeal device successfully. No immediate complication. Patient tolerated the procedure well. 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 Successful micro coil embolization of 2 right colic peripheral branches at  the site of dominant arterial supply to the angiodysplasia. Electronically Signed   By: Jerilynn Mages.  Shick M.D.   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 (Site correlates with the nuclear medicine GI bleeding scan) MEDICATIONS: 1% lidocaine local. ANESTHESIA/SEDATION: Moderate (conscious) sedation was employed during this procedure. A total of Versed 2.0 mg and Fentanyl 100 mcg was administered intravenously. Moderate Sedation Time: 50 minutes. The patient's level of consciousness and vital signs were monitored continuously by radiology nursing throughout the procedure under my direct supervision. CONTRAST:  75 cc FLUOROSCOPY TIME:  Fluoroscopy Time: 11 minutes 6 seconds (576 mGy). COMPLICATIONS: None immediate. PROCEDURE: Informed consent was obtained from the patient following explanation of the procedure, risks, benefits and alternatives. The patient understands, agrees and consents for the procedure. All questions were addressed. A time out was performed prior to the initiation of the procedure. Maximal barrier sterile technique utilized including caps, mask, sterile gowns, sterile gloves, large sterile drape, hand hygiene, and Betadine prep. Under sterile conditions and local anesthesia, ultrasound micropuncture access performed right common artery. Ultrasound images obtained documentation of the patent right common femoral artery. Five French sheath inserted over a guidewire. C2 catheter advanced to the SMA over a Glidewire. SMA angiogram performed. SMA: Atherosclerotic  changes noted with wall calcification. SMA is widely patent. The main SMA trunk including the jejunal and colic branches are all patent. In the right lower quadrant, there are hypertrophied tortuous peripheral right colic descending and ascending branches with intermittent areas of mild bleeding/oozing noted with a prominent enlarged draining vein in this region. Appearance is compatible with right colonic angiodysplasia. Through the C2 catheter, a Renegade STC catheter was advanced into the right colic artery. This was performed over a GT Glidewire. Right colic angiogram performed. Right colic: The main right colic artery is patent. Peripheral right colic ascending and descending branches are patent. Again, from 2 of the ascending right colic branches there is tortuous hypervascularity with intermittent oozing/bleeding noted and a prominent early draining vein. This appears to be the dominant vessels to the right colon angiodysplasia. Renegade STC microcatheter was advanced into the right colic descending branch. Right colic descending branch: The descending  branch appears patent without significant supply to the area of angio dysplasia. Descending branch vascular anatomy appears normal without hypervascularity, tortuosity, bleeding or early venous drainage. Microcatheter was retracted into the adjacent ascending right colic branch. Peripheral angiogram performed. Right colic ascending branch: There are 2 adjacent enlarged right colic ascending branches with associated tortuous hypervascularity, colonic wall staining, and early draining venous anatomy consistent with angiodysplasia. Right colic peripheral branch micro coil embolization: Two and 3 mm interlock coils in length of 4 and 6 mm successfully deployed in 2 adjacent branches of a peripheral right colic ascending artery at the site of angiodysplasia. Following micro coil embolization of these 2 adjacent small branches, repeat injection demonstrates  significant reduction in arterial vascular flow throughout the network of tortuous vessels related to angiodysplasia. After approximately 5 minutes repeat injection of the peripheral right colic artery again confirms reduction in arterial vascular flow to the area hypervascularity. No further intermittent oozing or bleeding appreciated. At this point the procedure was stopped. Access removed. Hemostasis obtained the right femoral puncture site with ExoSeal device successfully. No immediate complication. Patient tolerated the procedure well. 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 Successful micro coil embolization of 2 right colic peripheral branches at the site of dominant arterial supply to the angiodysplasia. Electronically Signed   By: Jerilynn Mages.  Shick M.D.   On: 07/14/2019 11:00    Scheduled Meds:  allopurinol  200 mg Oral Daily   amLODipine  10 mg Oral Daily   Chlorhexidine Gluconate Cloth  6 each Topical Q0600   furosemide  80 mg Oral BID   insulin aspart  0-5 Units Subcutaneous QHS   insulin aspart  0-6 Units Subcutaneous TID WC   labetalol  100 mg Oral BID   pantoprazole  40 mg Oral Daily   Continuous Infusions:  sodium chloride 10 mL/hr at 07/13/19 2343     LOS: 3 days    Flora Lipps, MD [facility] 07/15/2019, 10:40 AM

## 2019-07-16 DIAGNOSIS — K5520 Angiodysplasia of colon without hemorrhage: Secondary | ICD-10-CM

## 2019-07-16 LAB — BASIC METABOLIC PANEL
Anion gap: 17 — ABNORMAL HIGH (ref 5–15)
BUN: 42 mg/dL — ABNORMAL HIGH (ref 6–20)
CO2: 24 mmol/L (ref 22–32)
Calcium: 8.6 mg/dL — ABNORMAL LOW (ref 8.9–10.3)
Chloride: 94 mmol/L — ABNORMAL LOW (ref 98–111)
Creatinine, Ser: 5.56 mg/dL — ABNORMAL HIGH (ref 0.44–1.00)
GFR calc Af Amer: 9 mL/min — ABNORMAL LOW (ref >60)
GFR calc non Af Amer: 8 mL/min — ABNORMAL LOW (ref >60)
Glucose, Bld: 242 mg/dL — ABNORMAL HIGH (ref 70–99)
Potassium: 4.2 mmol/L (ref 3.5–5.1)
Sodium: 135 mmol/L (ref 135–145)

## 2019-07-16 LAB — CBC
HCT: 27.5 % — ABNORMAL LOW (ref 36.0–46.0)
Hemoglobin: 8.7 g/dL — ABNORMAL LOW (ref 12.0–15.0)
MCH: 32.1 pg (ref 26.0–34.0)
MCHC: 31.6 g/dL (ref 30.0–36.0)
MCV: 101.5 fL — ABNORMAL HIGH (ref 80.0–100.0)
Platelets: 95 10*3/uL — ABNORMAL LOW (ref 150–400)
RBC: 2.71 MIL/uL — ABNORMAL LOW (ref 3.87–5.11)
RDW: 18.7 % — ABNORMAL HIGH (ref 11.5–15.5)
WBC: 4.1 10*3/uL (ref 4.0–10.5)
nRBC: 0 % (ref 0.0–0.2)

## 2019-07-16 LAB — GLUCOSE, CAPILLARY: Glucose-Capillary: 175 mg/dL — ABNORMAL HIGH (ref 70–99)

## 2019-07-16 MED ORDER — LABETALOL HCL 100 MG PO TABS: 100.0000 mg | ORAL_TABLET | Freq: Two times a day (BID) | ORAL | 2 refills | Status: DC

## 2019-07-16 MED ORDER — AMLODIPINE BESYLATE 10 MG PO TABS: 10.0000 mg | ORAL_TABLET | Freq: Every day | ORAL | 2 refills | Status: AC

## 2019-07-16 NOTE — Discharge Summary (Signed)
Physician Discharge Summary  [name] OEU:[id number] DOB: 11/23/1965 DOA: 07/12/2019  PCP: Rocco Serene, MD  Admit date: 07/12/2019 Discharge date: 07/16/2019  Admitted From: Home  Discharge disposition: Home  Recommendations for Outpatient Follow-Up:    Follow up with your primary care provider in 1 week and discuss about hepatitis C treatment.  Continue hemodialysis as outpatient.  Adjust antihypertensives and insulin as necessary.  Discharge Diagnosis:   Principal Problem:   Melena Active Problems:   Seizure disorder (HCC)   Hyponatremia   Chronic combined systolic (congestive) and diastolic (congestive) heart failure (HCC)   Essential hypertension   Hyperkalemia   ESRD (end stage renal disease) (HCC)   Volume overload   Type II diabetes mellitus with renal manifestations (HCC)   GERD (gastroesophageal reflux disease)   Tobacco abuse   Discharge Condition: Improved.  Diet recommendation: Low sodium, heart healthy.  Carbohydrate-modified.    Wound care: None.  Code status: Full.   History of Present Illness:   [name] is a 53 y.o. female with a history of ESRD, T2DM, combined CHF, bipolar and schizoaffective disorder, and recurrent GI bleed with multiple negative work ups in the past who presented with dark stools, nausea and vomiting for 4 days on 12/7 which caused missed HD x2. She was hemodynamically stable with +FOBT melena and external hemorrhoids on exam. Metabolic panel grossly abnormal with potassium >7.5. Hgb noted to be 6.1, platelets 88k, WBC 3.6k. CXR with interstitial edema. 1u PRBCs transfused after confirming patient's consent (Jehovah's Witness), PPI bolused, GI consulted and nephrology consulted. Emergent HD was performed with improvement in metabolic parameters, normalization of potassium. Since she continued to have GI bleeding, tagged scan ordered by GI.  RBC scan was positive and patient underwent IR guided coil  embolization.   Hospital Course:  Following conditions were addressed during hospitalization,  Acute blood loss anemia on anemia of chronic kidney disease due to recurrent cryptogenic GI bleeding: With multiple negative work ups in the past. Macrocytic indices noted.  Concern for NSAID-induced gastritis given her recet history of ibuprofen use. GI consulted and repeat bleeding scan was positive.  Interventional radiology was consulted and patient underwent angiogram with findings of right colon angiodysplasia with intermittent bleeding and underwent successful coil embolization.  Patient received a total of 3 units of blood packed RBC during this hospitalization.  Patient was monitored further in the hospital and her hemoglobin remained stable.  ESRD: HD M/WF. Complicated by hyperkalemia in the setting of missing HD x2 PTA. - Nephrology  followed the patient during hospitalization.  Patient received hemodialysis.  Hyperkalemia resolved.  QT prolongation, peaked T waves in the setting of hyperkalemia- resolved with hemodialysis..   Chronic combined HFrEF: LVEF 40%. Remained compensated..  Hemodialysis during hospitalization.  Troponin elevation: Likely secondary to demand ischemia, mild.  Did not have chest pain.  No further work-up was pursued.  Accelerated HTN:  Seen was on Lasix and amlodipine as outpatient.  Patient states that she is takes labetalol at home but is not on her medication list.  Dose of amlodipine has been increased on this admission and patient has been started on labetalol twice a day.  Blood pressure will need to be closely monitored as outpatient and medication adjusted as needed.  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 Reportedly had negative esophageal varices screening.    Please monitor platelets as outpatient..    Latest platelet count prior to discharge  was 95,000.  HLD:  Statin was on hold  during hospitalization.  Will be resumed on discharge.  Diabetes mellitus Type II: well controlled with HbA1c 5.9%  SSI. Patient was on Lantus 24 units at bedtime at home.  This will need to be closely monitored as outpatient and insulin dose adjusted if necessary.  Tobacco use:  -Advised cessation.  Schizoaffective disorder, bipolar disorder: Mood was stable during hospitalization.- Continue SSRI and xanax from home  Chronic pain:  - Continue oxycodone   Jehovah's Witness: However agreed on receiving packed RBC this time.  Disposition.  At this time, patient is stable for disposition home.  Patient will need to follow-up with her primary care physician as outpatient.  She was advised to remain compliant with her dialysis regimen.    Medical Consultants:    Nephrology,   GI,   interventional radiology   Subjective:   Today, patient feels okay.  Denies any nausea, vomiting or abdominal pain.  Denies further GI bleed.  Wishes to go home.  Discharge Exam:   Vitals:   07/15/19 2300 07/16/19 0822  BP: (!) 172/90 (!) 178/101  Pulse: 92 87  Resp: 18   Temp: 97.8 F (36.6 C) 98.5 F (36.9 C)  SpO2: 100% 100%   Vitals:   07/15/19 1643 07/15/19 2221 07/15/19 2300 07/16/19 0822  BP: (!) 171/95 (!) 190/89 (!) 172/90 (!) 178/101  Pulse: 91 93 92 87  Resp: 16 20 18    Temp: 97.8 F (36.6 C) (!) 97.4 F (36.3 C) 97.8 F (36.6 C) 98.5 F (36.9 C)  TempSrc: Oral Oral Oral   SpO2: 98% 100% 100% 100%  Weight:        General exam: Appears calm and comfortable ,Not in distress, average built. HEENT:PERRL,Oral mucosa moist, mild pallor noted Respiratory system: Bilateral equal air entry, normal vesicular breath sounds,   No crackles or wheezes noted Cardiovascular system: S1 & S2 heard, RRR.  Gastrointestinal system: Abdomen is nondistended, soft and nontender. Normal bowel sounds heard. Central nervous system: Alert and oriented. No focal neurological  deficits. Extremities:distal peripheral pulses palpable.bilateral lower extremity with mild edema and superficial skin ulcerations.  Transmetatarsal amputation on the left. Skin: Bilateral lower extremity superficial skin ulcerations MSK: Normal muscle bulk,tone ,power    Procedures:    Hemodialysis, Superior mesenteric angiography and embolization by IR interventional radiology on 07/14/2019  The results of significant diagnostics from this hospitalization (including imaging, microbiology, ancillary and laboratory) are listed below for reference.     Diagnostic Studies:   DG Chest 2 View  Result Date: 07/12/2019 CLINICAL DATA:  Chest pain, missed dialysis EXAM: CHEST - 2 VIEW COMPARISON:  04/01/2019 FINDINGS: Cardiomegaly. Mild, diffuse interstitial opacity and minimal fissural fluid. The visualized skeletal structures are unremarkable. IMPRESSION: Mild, diffuse interstitial pulmonary opacity and minimal fissural fluid, likely edema in the setting of cardiomegaly and dialysis. No focal airspace opacity. Electronically Signed   By: Eddie Candle M.D.   On: 07/12/2019 14:54   NM GI Blood Loss  Result Date: 07/13/2019 CLINICAL DATA:  Concern for gastrointestinal bleeding. Dark stool, nausea and vomiting. Melena EXAM: NUCLEAR MEDICINE GASTROINTESTINAL BLEEDING SCAN TECHNIQUE: Sequential abdominal images were obtained following intravenous administration of Tc-48m labeled red blood cells. RADIOPHARMACEUTICALS:  Twenty mCi Tc-34m pertechnetate in-vitro labeled red cells. COMPARISON:  CT 05/14/2019 FINDINGS: During the first 60 minutes of imaging, focus of radiotracer accumulation develops in the RIGHT mid abdomen in relatively lateral position. Over the second hour imaging, radiotracer is progresses in tubular  pattern in the central abdomen and LEFT lateral abdomen. Findings are most consistent with active gastrointestinal bleeding beginning in the hepatic flexure of the colon and blood advancing to  the descending colon. IMPRESSION: Active gastrointestinal bleeding originating in the RIGHT colon. These results will be called to the ordering clinician or representative by the Radiologist Assistant, and communication documented in the PACS or zVision Dashboard. Electronically Signed   By: Suzy Bouchard M.D.   On: 07/13/2019 16:54   IR Angiogram Visceral Selective  Result Date: 07/14/2019 INDICATION: End-stage renal disease, recurrent active lower GI bleeding, anemia requiring transfusions EXAM: Ultrasound guidance for vascular access SMA, right colic, and 3 additional peripheral right colic branch catheterizations and angiograms Micro coil embolization of 2 peripheral right colic branches at the site of right colonic angio dysplasia with intermittent bleeding. (Site correlates with the nuclear medicine GI bleeding scan) MEDICATIONS: 1% lidocaine local. ANESTHESIA/SEDATION: Moderate (conscious) sedation was employed during this procedure. A total of Versed 2.0 mg and Fentanyl 100 mcg was administered intravenously. Moderate Sedation Time: 50 minutes. The patient's level of consciousness and vital signs were monitored continuously by radiology nursing throughout the procedure under my direct supervision. CONTRAST:  75 cc FLUOROSCOPY TIME:  Fluoroscopy Time: 11 minutes 6 seconds (576 mGy). COMPLICATIONS: None immediate. PROCEDURE: Informed consent was obtained from the patient following explanation of the procedure, risks, benefits and alternatives. The patient understands, agrees and consents for the procedure. All questions were addressed. A time out was performed prior to the initiation of the procedure. Maximal barrier sterile technique utilized including caps, mask, sterile gowns, sterile gloves, large sterile drape, hand hygiene, and Betadine prep. Under sterile conditions and local anesthesia, ultrasound micropuncture access performed right common artery. Ultrasound images obtained documentation of the  patent right common femoral artery. Five French sheath inserted over a guidewire. C2 catheter advanced to the SMA over a Glidewire. SMA angiogram performed. SMA: Atherosclerotic changes noted with wall calcification. SMA is widely patent. The main SMA trunk including the jejunal and colic branches are all patent. In the right lower quadrant, there are hypertrophied tortuous peripheral right colic descending and ascending branches with intermittent areas of mild bleeding/oozing noted with a prominent enlarged draining vein in this region. Appearance is compatible with right colonic angiodysplasia. Through the C2 catheter, a Renegade STC catheter was advanced into the right colic artery. This was performed over a GT Glidewire. Right colic angiogram performed. Right colic: The main right colic artery is patent. Peripheral right colic ascending and descending branches are patent. Again, from 2 of the ascending right colic branches there is tortuous hypervascularity with intermittent oozing/bleeding noted and a prominent early draining vein. This appears to be the dominant vessels to the right colon angiodysplasia. Renegade STC microcatheter was advanced into the right colic descending branch. Right colic descending branch: The descending branch appears patent without significant supply to the area of angio dysplasia. Descending branch vascular anatomy appears normal without hypervascularity, tortuosity, bleeding or early venous drainage. Microcatheter was retracted into the adjacent ascending right colic branch. Peripheral angiogram performed. Right colic ascending branch: There are 2 adjacent enlarged right colic ascending branches with associated tortuous hypervascularity, colonic wall staining, and early draining venous anatomy consistent with angiodysplasia. Right colic peripheral branch micro coil embolization: Two and 3 mm interlock coils in length of 4 and 6 mm successfully deployed in 2 adjacent branches of a  peripheral right colic ascending artery at the site of angiodysplasia. Following micro coil embolization of these 2 adjacent small  branches, repeat injection demonstrates significant reduction in arterial vascular flow throughout the network of tortuous vessels related to angiodysplasia. After approximately 5 minutes repeat injection of the peripheral right colic artery again confirms reduction in arterial vascular flow to the area hypervascularity. No further intermittent oozing or bleeding appreciated. At this point the procedure was stopped. Access removed. Hemostasis obtained the right femoral puncture site with ExoSeal device successfully. No immediate complication. Patient tolerated the procedure well. 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 Successful micro coil embolization of 2 right colic peripheral branches at the site of dominant arterial supply to the angiodysplasia. Electronically Signed   By: Jerilynn Mages.  Shick M.D.   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 (Site correlates with the nuclear medicine GI bleeding scan) MEDICATIONS: 1% lidocaine local. ANESTHESIA/SEDATION: Moderate (conscious) sedation was employed during this procedure. A total of Versed 2.0 mg and Fentanyl 100 mcg was administered intravenously. Moderate Sedation Time: 50 minutes. The patient's level of consciousness and vital signs were monitored continuously by radiology nursing throughout the procedure under my direct supervision. CONTRAST:  75 cc  FLUOROSCOPY TIME:  Fluoroscopy Time: 11 minutes 6 seconds (576 mGy). COMPLICATIONS: None immediate. PROCEDURE: Informed consent was obtained from the patient following explanation of the procedure, risks, benefits and alternatives. The patient understands, agrees and consents for the procedure. All questions were addressed. A time out was performed prior to the initiation of the procedure. Maximal barrier sterile technique utilized including caps, mask, sterile gowns, sterile gloves, large sterile drape, hand hygiene, and Betadine prep. Under sterile conditions and local anesthesia, ultrasound micropuncture access performed right common artery. Ultrasound images obtained documentation of the patent right common femoral artery. Five French sheath inserted over a guidewire. C2 catheter advanced to the SMA over a Glidewire. SMA angiogram performed. SMA: Atherosclerotic changes noted with wall calcification. SMA is widely patent. The main SMA trunk including the jejunal and colic branches are all patent. In the right lower quadrant, there are hypertrophied tortuous peripheral right colic descending and ascending branches with intermittent areas of mild bleeding/oozing noted with a prominent enlarged draining vein in this region. Appearance is compatible with right colonic angiodysplasia. Through the C2 catheter, a Renegade STC catheter was advanced into the right colic artery. This was performed over a GT Glidewire. Right colic angiogram performed. Right colic: The main right colic artery is patent. Peripheral right colic ascending and descending branches are patent. Again, from 2 of the ascending right colic branches there is tortuous hypervascularity with intermittent oozing/bleeding noted and a prominent early draining vein. This appears to be the dominant vessels to the right colon angiodysplasia. Renegade STC microcatheter was advanced into the right colic descending branch. Right colic descending branch: The  descending branch appears patent without significant supply to the area of angio dysplasia. Descending branch vascular anatomy appears normal without hypervascularity, tortuosity, bleeding or early venous drainage. Microcatheter was retracted into the adjacent ascending right colic branch. Peripheral angiogram performed. Right colic ascending branch: There are 2 adjacent enlarged right colic ascending branches with associated tortuous hypervascularity, colonic wall staining, and early draining venous anatomy consistent with angiodysplasia. Right colic peripheral  branch micro coil embolization: Two and 3 mm interlock coils in length of 4 and 6 mm successfully deployed in 2 adjacent branches of a peripheral right colic ascending artery at the site of angiodysplasia. Following micro coil embolization of these 2 adjacent small branches, repeat injection demonstrates significant reduction in arterial vascular flow throughout the network of tortuous vessels related to angiodysplasia. After approximately 5 minutes repeat injection of the peripheral right colic artery again confirms reduction in arterial vascular flow to the area hypervascularity. No further intermittent oozing or bleeding appreciated. At this point the procedure was stopped. Access removed. Hemostasis obtained the right femoral puncture site with ExoSeal device successfully. No immediate complication. Patient tolerated the procedure well. 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 Successful micro coil embolization of 2 right colic peripheral branches at the site of dominant arterial supply to the angiodysplasia. Electronically Signed   By: Jerilynn Mages.  Shick M.D.   On: 07/14/2019 11:00   IR Angiogram Selective Each Additional Vessel  Result Date: 07/14/2019 INDICATION: End-stage renal disease, recurrent active lower GI bleeding, anemia requiring transfusions EXAM: Ultrasound guidance for vascular  access SMA, right colic, and 3 additional peripheral right colic branch catheterizations and angiograms Micro coil embolization of 2 peripheral right colic branches at the site of right colonic angio dysplasia with intermittent bleeding. (Site correlates with the nuclear medicine GI bleeding scan) MEDICATIONS: 1% lidocaine local. ANESTHESIA/SEDATION: Moderate (conscious) sedation was employed during this procedure. A total of Versed 2.0 mg and Fentanyl 100 mcg was administered intravenously. Moderate Sedation Time: 50 minutes. The patient's level of consciousness and vital signs were monitored continuously by radiology nursing throughout the procedure under my direct supervision. CONTRAST:  75 cc FLUOROSCOPY TIME:  Fluoroscopy Time: 11 minutes 6 seconds (576 mGy). COMPLICATIONS: None immediate. PROCEDURE: Informed consent was obtained from the patient following explanation of the procedure, risks, benefits and alternatives. The patient understands, agrees and consents for the procedure. All questions were addressed. A time out was performed prior to the initiation of the procedure. Maximal barrier sterile technique utilized including caps, mask, sterile gowns, sterile gloves, large sterile drape, hand hygiene, and Betadine prep. Under sterile conditions and local anesthesia, ultrasound micropuncture access performed right common artery. Ultrasound images obtained documentation of the patent right common femoral artery. Five French sheath inserted over a guidewire. C2 catheter advanced to the SMA over a Glidewire. SMA angiogram performed. SMA: Atherosclerotic changes noted with wall calcification. SMA is widely patent. The main SMA trunk including the jejunal and colic branches are all patent. In the right lower quadrant, there are hypertrophied tortuous peripheral right colic descending and ascending branches with intermittent areas of mild bleeding/oozing noted with a prominent enlarged draining vein in this  region. Appearance is compatible with right colonic angiodysplasia. Through the C2 catheter, a Renegade STC catheter was advanced into the right colic artery. This was performed over a GT Glidewire. Right colic angiogram performed. Right colic: The main right colic artery is patent. Peripheral right colic ascending and descending branches are patent. Again, from 2 of the ascending right colic branches there is tortuous hypervascularity with intermittent oozing/bleeding noted and a prominent early draining vein. This appears to be the dominant vessels to the right colon angiodysplasia. Renegade STC microcatheter was advanced into the right colic descending branch. Right colic descending branch: The descending branch appears patent without significant supply to the area of angio dysplasia. Descending branch vascular anatomy appears normal without hypervascularity, tortuosity, bleeding or early venous drainage.  Microcatheter was retracted into the adjacent ascending right colic branch. Peripheral angiogram performed. Right colic ascending branch: There are 2 adjacent enlarged right colic ascending branches with associated tortuous hypervascularity, colonic wall staining, and early draining venous anatomy consistent with angiodysplasia. Right colic peripheral branch micro coil embolization: Two and 3 mm interlock coils in length of 4 and 6 mm successfully deployed in 2 adjacent branches of a peripheral right colic ascending artery at the site of angiodysplasia. Following micro coil embolization of these 2 adjacent small branches, repeat injection demonstrates significant reduction in arterial vascular flow throughout the network of tortuous vessels related to angiodysplasia. After approximately 5 minutes repeat injection of the peripheral right colic artery again confirms reduction in arterial vascular flow to the area hypervascularity. No further intermittent oozing or bleeding appreciated. At this point the procedure  was stopped. Access removed. Hemostasis obtained the right femoral puncture site with ExoSeal device successfully. No immediate complication. Patient tolerated the procedure well. 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 Successful micro coil embolization of 2 right colic peripheral branches at the site of dominant arterial supply to the angiodysplasia. Electronically Signed   By: Jerilynn Mages.  Shick M.D.   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 (Site correlates with the nuclear medicine GI bleeding scan) MEDICATIONS: 1% lidocaine local. ANESTHESIA/SEDATION: Moderate (conscious) sedation was employed during this procedure. A total of Versed 2.0 mg and Fentanyl 100 mcg was administered intravenously. Moderate Sedation Time: 50 minutes. The patient's level of consciousness and vital signs were monitored continuously by radiology nursing throughout the procedure under my direct supervision. CONTRAST:  75 cc FLUOROSCOPY TIME:  Fluoroscopy Time: 11 minutes 6 seconds (576 mGy). COMPLICATIONS: None immediate. PROCEDURE: Informed consent was obtained from the patient following explanation of the procedure, risks, benefits and alternatives. The patient understands, agrees and consents for the procedure. All questions were addressed. A time out was performed prior to the initiation of the procedure. Maximal barrier sterile technique utilized including caps, mask, sterile gowns, sterile gloves, large sterile drape, hand hygiene, and Betadine  prep. Under sterile conditions and local anesthesia, ultrasound micropuncture access performed right common artery. Ultrasound images obtained documentation of the patent right common femoral artery. Five French sheath inserted over a guidewire. C2 catheter advanced to the SMA over a Glidewire. SMA angiogram performed. SMA: Atherosclerotic changes noted with wall calcification. SMA is widely patent. The main SMA trunk including the jejunal and colic branches are all patent. In the right lower quadrant, there are hypertrophied tortuous peripheral right colic descending and ascending branches with intermittent areas of mild bleeding/oozing noted with a prominent enlarged draining vein in this region. Appearance is compatible with right colonic angiodysplasia. Through the C2 catheter, a Renegade STC catheter was advanced into the right colic artery. This was performed over a GT Glidewire. Right colic angiogram performed. Right colic: The main right colic artery is patent. Peripheral right colic ascending and descending branches are patent. Again, from 2 of the ascending right colic branches there is tortuous hypervascularity with intermittent oozing/bleeding noted and a prominent early draining vein. This appears to be the dominant vessels to the right colon  angiodysplasia. Renegade STC microcatheter was advanced into the right colic descending branch. Right colic descending branch: The descending branch appears patent without significant supply to the area of angio dysplasia. Descending branch vascular anatomy appears normal without hypervascularity, tortuosity, bleeding or early venous drainage. Microcatheter was retracted into the adjacent ascending right colic branch. Peripheral angiogram performed. Right colic ascending branch: There are 2 adjacent enlarged right colic ascending branches with associated tortuous hypervascularity, colonic wall staining, and early draining venous anatomy consistent with  angiodysplasia. Right colic peripheral branch micro coil embolization: Two and 3 mm interlock coils in length of 4 and 6 mm successfully deployed in 2 adjacent branches of a peripheral right colic ascending artery at the site of angiodysplasia. Following micro coil embolization of these 2 adjacent small branches, repeat injection demonstrates significant reduction in arterial vascular flow throughout the network of tortuous vessels related to angiodysplasia. After approximately 5 minutes repeat injection of the peripheral right colic artery again confirms reduction in arterial vascular flow to the area hypervascularity. No further intermittent oozing or bleeding appreciated. At this point the procedure was stopped. Access removed. Hemostasis obtained the right femoral puncture site with ExoSeal device successfully. No immediate complication. Patient tolerated the procedure well. 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 Successful micro coil embolization of 2 right colic peripheral branches at the site of dominant arterial supply to the angiodysplasia. Electronically Signed   By: Jerilynn Mages.  Shick M.D.   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 (Site correlates with the nuclear medicine GI bleeding scan) MEDICATIONS: 1% lidocaine local. ANESTHESIA/SEDATION: Moderate (conscious) sedation was employed during this procedure. A total of Versed 2.0 mg and Fentanyl 100 mcg was administered intravenously. Moderate Sedation Time: 50 minutes.  The patient's level of consciousness and vital signs were monitored continuously by radiology nursing throughout the procedure under my direct supervision. CONTRAST:  75 cc FLUOROSCOPY TIME:  Fluoroscopy Time: 11 minutes 6 seconds (576 mGy). COMPLICATIONS: None immediate. PROCEDURE: Informed consent was obtained from the patient following explanation of the procedure, risks, benefits and alternatives. The patient understands, agrees and consents for the procedure. All questions were addressed. A time out was performed prior to the initiation of the procedure. Maximal barrier sterile technique utilized including caps, mask, sterile gowns, sterile gloves, large sterile drape, hand hygiene, and Betadine prep. Under sterile conditions and local anesthesia, ultrasound micropuncture access performed right common artery. Ultrasound images obtained documentation of the patent right common femoral artery. Five French sheath inserted over a guidewire. C2 catheter advanced to the SMA over a Glidewire. SMA angiogram performed. SMA: Atherosclerotic changes noted with wall calcification. SMA is widely patent. The main SMA trunk including the jejunal and colic branches are all patent. In the right lower quadrant, there are hypertrophied tortuous peripheral right colic descending and ascending branches with intermittent areas of mild bleeding/oozing noted with a prominent enlarged draining vein in this region. Appearance is compatible with right colonic angiodysplasia. Through the C2 catheter, a Renegade STC catheter was advanced into the right colic artery. This was performed over a GT Glidewire. Right colic angiogram performed. Right colic: The main right colic artery is  patent. Peripheral right colic ascending and descending branches are patent. Again, from 2 of the ascending right colic branches there is tortuous hypervascularity with intermittent oozing/bleeding noted and a prominent early draining vein. This appears to be the  dominant vessels to the right colon angiodysplasia. Renegade STC microcatheter was advanced into the right colic descending branch. Right colic descending branch: The descending branch appears patent without significant supply to the area of angio dysplasia. Descending branch vascular anatomy appears normal without hypervascularity, tortuosity, bleeding or early venous drainage. Microcatheter was retracted into the adjacent ascending right colic branch. Peripheral angiogram performed. Right colic ascending branch: There are 2 adjacent enlarged right colic ascending branches with associated tortuous hypervascularity, colonic wall staining, and early draining venous anatomy consistent with angiodysplasia. Right colic peripheral branch micro coil embolization: Two and 3 mm interlock coils in length of 4 and 6 mm successfully deployed in 2 adjacent branches of a peripheral right colic ascending artery at the site of angiodysplasia. Following micro coil embolization of these 2 adjacent small branches, repeat injection demonstrates significant reduction in arterial vascular flow throughout the network of tortuous vessels related to angiodysplasia. After approximately 5 minutes repeat injection of the peripheral right colic artery again confirms reduction in arterial vascular flow to the area hypervascularity. No further intermittent oozing or bleeding appreciated. At this point the procedure was stopped. Access removed. Hemostasis obtained the right femoral puncture site with ExoSeal device successfully. No immediate complication. Patient tolerated the procedure well. 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 Successful micro coil embolization of 2 right colic peripheral branches at the site of dominant arterial supply to the angiodysplasia. Electronically Signed   By: Jerilynn Mages.  Shick M.D.   On: 07/14/2019 11:00   IR EMBO ART  VEN HEMORR LYMPH EXTRAV  INC GUIDE  ROADMAPPING  Result Date: 07/14/2019 INDICATION: End-stage renal disease, recurrent active lower GI bleeding, anemia requiring transfusions EXAM: Ultrasound guidance for vascular access SMA, right colic, and 3 additional peripheral right colic branch catheterizations and angiograms Micro coil embolization of 2 peripheral right colic branches at the site of right colonic angio dysplasia with intermittent bleeding. (Site correlates with the nuclear medicine GI bleeding scan) MEDICATIONS: 1% lidocaine local. ANESTHESIA/SEDATION: Moderate (conscious) sedation was employed during this procedure. A total of Versed 2.0 mg and Fentanyl 100 mcg was administered intravenously. Moderate Sedation Time: 50 minutes. The patient's level of consciousness and vital signs were monitored continuously by radiology nursing throughout the procedure under my direct supervision. CONTRAST:  75 cc FLUOROSCOPY TIME:  Fluoroscopy Time: 11 minutes 6 seconds (576 mGy). COMPLICATIONS: None immediate. PROCEDURE: Informed consent was obtained from the patient following explanation of the procedure, risks, benefits and alternatives. The patient understands, agrees and consents for the procedure. All questions were addressed. A time out was performed prior to the initiation of the procedure. Maximal barrier sterile technique utilized including caps, mask, sterile gowns, sterile gloves, large sterile drape, hand hygiene, and Betadine prep. Under sterile conditions and local anesthesia, ultrasound micropuncture access performed right common artery. Ultrasound images obtained documentation of the patent right common femoral artery. Five French sheath inserted over a guidewire. C2 catheter advanced to the SMA over a Glidewire. SMA angiogram performed. SMA: Atherosclerotic changes noted with wall calcification. SMA is widely patent. The main SMA trunk including the jejunal and colic branches are all patent. In the right lower quadrant, there are  hypertrophied tortuous peripheral right colic descending and ascending branches with intermittent areas of mild bleeding/oozing  noted with a prominent enlarged draining vein in this region. Appearance is compatible with right colonic angiodysplasia. Through the C2 catheter, a Renegade STC catheter was advanced into the right colic artery. This was performed over a GT Glidewire. Right colic angiogram performed. Right colic: The main right colic artery is patent. Peripheral right colic ascending and descending branches are patent. Again, from 2 of the ascending right colic branches there is tortuous hypervascularity with intermittent oozing/bleeding noted and a prominent early draining vein. This appears to be the dominant vessels to the right colon angiodysplasia. Renegade STC microcatheter was advanced into the right colic descending branch. Right colic descending branch: The descending branch appears patent without significant supply to the area of angio dysplasia. Descending branch vascular anatomy appears normal without hypervascularity, tortuosity, bleeding or early venous drainage. Microcatheter was retracted into the adjacent ascending right colic branch. Peripheral angiogram performed. Right colic ascending branch: There are 2 adjacent enlarged right colic ascending branches with associated tortuous hypervascularity, colonic wall staining, and early draining venous anatomy consistent with angiodysplasia. Right colic peripheral branch micro coil embolization: Two and 3 mm interlock coils in length of 4 and 6 mm successfully deployed in 2 adjacent branches of a peripheral right colic ascending artery at the site of angiodysplasia. Following micro coil embolization of these 2 adjacent small branches, repeat injection demonstrates significant reduction in arterial vascular flow throughout the network of tortuous vessels related to angiodysplasia. After approximately 5 minutes repeat injection of the peripheral  right colic artery again confirms reduction in arterial vascular flow to the area hypervascularity. No further intermittent oozing or bleeding appreciated. At this point the procedure was stopped. Access removed. Hemostasis obtained the right femoral puncture site with ExoSeal device successfully. No immediate complication. Patient tolerated the procedure well. 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 Successful micro coil embolization of 2 right colic peripheral branches at the site of dominant arterial supply to the angiodysplasia. Electronically Signed   By: Jerilynn Mages.  Shick M.D.   On: 07/14/2019 11:00     Labs:   Basic Metabolic Panel: Recent Labs  Lab 07/12/19 1753 07/12/19 1808 07/12/19 1809 07/13/19 1140 07/14/19 0520 07/15/19 0307 07/16/19 0222  NA 134* 132* 132* 136 135 137 135  K 7.1* 7.0* 6.9* 4.0 4.8 3.9 4.2  CL 97* 105  --  94* 92* 98 94*  CO2 14*  --   --  25 25 26 24   GLUCOSE 253* 239*  --  100* 117* 150* 242*  BUN 136* 131*  --  49* 64* 28* 42*  CREATININE 10.26* 11.40*  --  5.29* 6.28* 4.13* 5.56*  CALCIUM 8.0*  --   --  8.2* 8.8* 8.2* 8.6*  MG  --   --   --   --   --  2.2  --    GFR Estimated Creatinine Clearance: 11.2 mL/min (A) (by C-G formula based on SCr of 5.56 mg/dL (H)). Liver Function Tests: Recent Labs  Lab 07/12/19 1438 07/15/19 0307  AST 32 54*  ALT 38 45*  ALKPHOS 78 58  BILITOT 0.7 0.6  PROT 7.3 6.1*  ALBUMIN 3.6 2.9*   No results for input(s): LIPASE, AMYLASE in the last 168 hours. No results for input(s): AMMONIA in the last 168 hours. Coagulation profile Recent Labs  Lab 07/14/19 0520  INR 1.1    CBC: Recent Labs  Lab 07/12/19 1438 07/12/19 1809 07/13/19 1140 07/14/19 0520 07/15/19 0307 07/16/19 0222  WBC 3.6*  --  2.9* 4.9 3.2* 4.1  HGB 6.1* 6.1* 6.4* 10.0* 8.5* 8.7*  HCT 19.5* 18.0* 20.4* 30.3* 26.4* 27.5*  MCV 108.9*  --  106.8* 98.1 98.9 101.5*  PLT 88*  --  75*  83* 77* 95*   Cardiac Enzymes: No results for input(s): CKTOTAL, CKMB, CKMBINDEX, TROPONINI in the last 168 hours. BNP: Invalid input(s): POCBNP CBG: Recent Labs  Lab 07/15/19 0739 07/15/19 1132 07/15/19 1630 07/15/19 2222 07/16/19 0821  GLUCAP 162* 197* 270* 214* 175*   D-Dimer No results for input(s): DDIMER in the last 72 hours. Hgb A1c No results for input(s): HGBA1C in the last 72 hours. Lipid Profile No results for input(s): CHOL, HDL, LDLCALC, TRIG, CHOLHDL, LDLDIRECT in the last 72 hours. Thyroid function studies No results for input(s): TSH, T4TOTAL, T3FREE, THYROIDAB in the last 72 hours.  Invalid input(s): FREET3 Anemia work up No results for input(s): VITAMINB12, FOLATE, FERRITIN, TIBC, IRON, RETICCTPCT in the last 72 hours. Microbiology Recent Results (from the past 240 hour(s))  MRSA PCR Screening     Status: None   Collection Time: 07/14/19 12:58 AM   Specimen: Nasal Mucosa; Nasopharyngeal  Result Value Ref Range Status   MRSA by PCR NEGATIVE NEGATIVE Final    Comment:        The GeneXpert MRSA Assay (FDA approved for NASAL specimens only), is one component of a comprehensive MRSA colonization surveillance program. It is not intended to diagnose MRSA infection nor to guide or monitor treatment for MRSA infections. Performed at [facility], [place] [address], [place], [place] [address]      Discharge Instructions:   Discharge Instructions    Activity as tolerated - No restrictions   Complete by: As directed    Call MD for:  temperature >100.4   Complete by: As directed    Diet - low sodium heart healthy   Complete by: As directed    Diet Carb Modified   Complete by: As directed    Discharge instructions   Complete by: As directed    Follow-up with your nephrology as outpatient. Follow-up with GI in outpatient for hepatitis C, discuss with your primary care physician about referral to GI. Follow up with your primary care physician as  scheduled by you.     Allergies as of 07/16/2019      Reactions   Penicillins Hives, Other (See Comments)   Has patient had a PCN reaction causing immediate rash, facial/tongue/throat swelling, SOB or lightheadedness with hypotension: Yes Has patient had a PCN reaction causing severe rash involving mucus membranes or skin necrosis: No Has patient had a PCN reaction that required hospitalization No Has patient had a PCN reaction occurring within the last 10 years: No If all of the above answers are "NO", then may proceed with Cephalosporin use. Pt tolerates cefepime and ceftriaxone   Gabapentin Itching, Swelling   Lyrica [pregabalin] Swelling, Other (See Comments)   Facial and leg swelling   Saphris [asenapine] Swelling, Other (See Comments)   Facial swelling   Tramadol Swelling, Other (See Comments)   Leg swelling   Adhesive [tape] Itching   SEVERE ITCHING   Latex Itching   SEVERE ITCHING   Other Other (See Comments)   NO BLOOD PRODUCTS; PATIENT IS A JEHOVAH'S WITNESS      Medication List    TAKE these medications   acetaminophen 325 MG tablet Commonly known as: TYLENOL Take 2 tablets (650 mg total) by mouth every 6 (six) hours as needed for fever,  headache or moderate pain.   albuterol (2.5 MG/3ML) 0.083% nebulizer solution Commonly known as: PROVENTIL Take 5 mg by nebulization daily as needed for wheezing or shortness of breath.   albuterol 108 (90 Base) MCG/ACT inhaler Commonly known as: VENTOLIN HFA Inhale 2 puffs into the lungs every 4 (four) hours as needed for wheezing or shortness of breath.   allopurinol 100 MG tablet Commonly known as: ZYLOPRIM Take 2 tablets (200 mg total) by mouth daily.   ALPRAZolam 0.5 MG tablet Commonly known as: XANAX Take 0.5 mg by mouth 3 (three) times daily as needed for anxiety.   amLODipine 10 MG tablet Commonly known as: NORVASC Take 1 tablet (10 mg total) by mouth daily. What changed:   medication strength  how much to  take   atorvastatin 40 MG tablet Commonly known as: LIPITOR Take 1 tablet (40 mg total) by mouth at bedtime.   citalopram 20 MG tablet Commonly known as: CELEXA Take 20 mg by mouth daily.   Darbepoetin Alfa 200 MCG/0.4ML Sosy injection Commonly known as: ARANESP Inject 0.4 mLs (200 mcg total) into the vein every Wednesday with hemodialysis.   diphenhydrAMINE 25 mg capsule Commonly known as: BENADRYL Take 1 capsule (25 mg total) by mouth every 6 (six) hours as needed for up to 8 days for itching.   furosemide 80 MG tablet Commonly known as: LASIX Take 80 mg by mouth 2 (two) times daily.   hydrocortisone 2.5 % rectal cream Commonly known as: ANUSOL-HC Place 1 application rectally 2 (two) times daily.   hydrOXYzine 25 MG tablet Commonly known as: ATARAX/VISTARIL Take 1 tablet (25 mg total) by mouth 3 (three) times daily as needed for itching or anxiety.   insulin aspart 100 UNIT/ML injection Commonly known as: novoLOG 0-9 Units, Subcutaneous, 3 times daily with meals CBG < 70: implement hypoglycemia protocol CBG 70 - 120: 0 units CBG 121 - 150: 1 unit CBG 151 - 200: 2 units CBG 201 - 250: 3 units CBG 251 - 300: 5 units CBG 301 - 350: 7 units CBG 351 - 400: 9 units CBG > 400: call MD What changed:   how much to take  how to take this  when to take this   insulin glargine 100 UNIT/ML injection Commonly known as: LANTUS Inject 24 Units into the skin at bedtime.   labetalol 100 MG tablet Commonly known as: NORMODYNE Take 1 tablet (100 mg total) by mouth 2 (two) times daily.   lidocaine-prilocaine cream Commonly known as: EMLA Apply 1 application topically See admin instructions. On dialysis days   methocarbamol 500 MG tablet Commonly known as: ROBAXIN Take 500 mg by mouth daily as needed for muscle spasms.   mupirocin ointment 2 % Commonly known as: BACTROBAN Apply 1 application topically as needed (on arm).   Oxycodone HCl 10 MG Tabs Take 1 tablet (10 mg total)  by mouth 4 (four) times daily.   oxymetazoline 0.05 % nasal spray Commonly known as: AFRIN Place 2 sprays into both nostrils 2 (two) times daily.   pantoprazole 40 MG tablet Commonly known as: PROTONIX Take 1 tablet (40 mg total) by mouth daily at 6 (six) AM for 30 days.   SARNA ULTRA EX Apply 1 application topically daily as needed (itch).   senna-docusate 8.6-50 MG tablet Commonly known as: Senokot-S Take 2 tablets by mouth at bedtime.       Time coordinating discharge: 39 minutes  Signed:  Gavynn Duvall  [facility] 07/16/2019, 10:08 AM

## 2019-07-16 NOTE — Progress Notes (Signed)
[facility] Progress Note  Subjective: Hb 8.7, up slihglty , no new c/o per pt. No bleeding overnight  Vitals:   07/15/19 1643 07/15/19 2221 07/15/19 2300 07/16/19 0822  BP: (!) 171/95 (!) 190/89 (!) 172/90 (!) 178/101  Pulse: 91 93 92 87  Resp: 16 20 18    Temp: 97.8 F (36.6 C) (!) 97.4 F (36.3 C) 97.8 F (36.6 C) 98.5 F (36.9 C)  TempSrc: Oral Oral Oral   SpO2: 98% 100% 100% 100%  Weight:        Inpatient medications: . allopurinol  200 mg Oral Daily  . amLODipine  10 mg Oral Daily  . Chlorhexidine Gluconate Cloth  6 each Topical Q0600  . furosemide  80 mg Oral BID  . insulin aspart  0-5 Units Subcutaneous QHS  . insulin aspart  0-6 Units Subcutaneous TID WC  . labetalol  100 mg Oral BID  . pantoprazole  40 mg Oral Daily   . sodium chloride 10 mL/hr at 07/13/19 2343   acetaminophen **OR** acetaminophen, albuterol, ALPRAZolam, calcium carbonate (dosed in mg elemental calcium), camphor-menthol, dextrose, docusate sodium, feeding supplement (NEPRO CARB STEADY), fentaNYL, hydrALAZINE, oxyCODONE, sorbitol, technetium labeled red blood cells, zolpidem    Exam: Gen alert, no distress, lying flat No jvd or bruits Chest clear bilat to bases no rales RRR no MRG Abd soft ntnd no mass or ascites +bs Ext mild LE edema Neuro is alert, Ox 3 , nf L AVG +bruit    Outpt HD: GO MWF  3.5h  73.5kg  P4  AVG  Hep none  - calc 2.5 ug tiw  - mircera 225 q 2, last ?     Assessment/ Plan: 1. GIB / anemia of ABL - Hb up to 10, sp SMA angio with peripheral Rt colic branch micro coil embolization yest 12/8 per IR.  Hb up 8.7 this am. Have d/w primary MD, will plan on dc this am and she will go to her OP HD this afternoon.  2. Hyperkalemia - severe on admit, resolved w/ HD 3. ESRD - MWF HD.  As above.  4. Anemia ckd - cont esa, get records 5. HTN - vol good, under dry wt now, lower dry on dc 6. Hep C  7. Dispo - for dc today    Rob Angelgabriel Willmore 07/16/2019,  10:28 AM  Iron/TIBC/Ferritin/ %Sat    Component Value Date/Time   IRON 95 04/01/2019 2013   TIBC 326 04/01/2019 2013   FERRITIN 804 (H) 04/01/2019 2013   IRONPCTSAT 29 04/01/2019 2013   Recent Labs  Lab 07/12/19 1438 07/14/19 0520 07/15/19 0307 07/16/19 0222  NA   < > 135 137 135  K   < > 4.8 3.9 4.2  CL   < > 92* 98 94*  CO2   < > 25 26 24   GLUCOSE   < > 117* 150* 242*  BUN   < > 64* 28* 42*  CREATININE   < > 6.28* 4.13* 5.56*  CALCIUM   < > 8.8* 8.2* 8.6*  ALBUMIN  --   --  2.9*  --   INR  --  1.1  --   --    < > = values in this interval not displayed.   Recent Labs  Lab 07/15/19 0307  AST 54*  ALT 45*  ALKPHOS 58  BILITOT 0.6  PROT 6.1*   Recent Labs  Lab 07/16/19 0222  WBC 4.1  HGB 8.7*  HCT 27.5*  PLT 95*

## 2019-07-16 NOTE — Progress Notes (Signed)
Patient to discharge today and go to OP HD clinic/[facility] for HD treatment. Chair time is 12:45pm. She should arrive at 12:25pm. She is in agreement and appreciative. Navigator has provided socks, sweat pants and a blanket to patient for her comfort, since we will get her straight to clinic from hospital.  CM/A. Landry Mellow aware to assist with transportation to clinic. Navigator called [employer] to pick patient up after treatment to take patient home. Patient cleared for discharge from Renal standpoint.  Alphonzo Cruise, [place] [facility] Navigator [phone]

## 2019-07-18 ENCOUNTER — Telehealth: Payer: Self-pay | Admitting: Nephrology

## 2019-07-18 NOTE — Telephone Encounter (Signed)
Transition of care contact from inpatient facility  Date of Discharge: 07/16/2019 Date of Contact: 07/18/2019 Method of contact: phone Talked with: left message on voice mail that I was calling to see if she had any problems after discharge and to review discharge information.  She did attend full dialysis 12/11 after discharge and had minimal fluid gain - only 0.1 - BP high - I think EDW needs to be lowered but she has very high IDWG so will hold off until she is evaluated by rounding provider next week.    Amalia Hailey, PA-C [facility] Pager:  [phone]

## 2019-07-22 ENCOUNTER — Telehealth: Payer: Self-pay | Admitting: Physician Assistant

## 2019-07-22 NOTE — Telephone Encounter (Signed)
Transition of care contact from inpatient facility  Date of Discharge: 07/16/2019 Date of Contact: 07/22/2019 Method of contact: phone  Attempted to contact patient to discuss recent hospital discharge. Patient has not been at her past 2 dialysis treatments. She did not answer the phone. Left message requesting a call back.   Anice Paganini, PA-C 07/22/2019, 4:38 PM  [facility] Pager: [phone]

## 2019-08-16 ENCOUNTER — Other Ambulatory Visit: Payer: Self-pay

## 2019-08-16 ENCOUNTER — Encounter (HOSPITAL_COMMUNITY): Payer: Self-pay | Admitting: Emergency Medicine

## 2019-08-16 ENCOUNTER — Inpatient Hospital Stay (HOSPITAL_COMMUNITY)
Admission: EM | Admit: 2019-08-16 | Discharge: 2019-08-19 | DRG: 377 | Disposition: A | Discharge status: Home Health | Payer: Medicare Other | Attending: Internal Medicine | Admitting: Internal Medicine

## 2019-08-16 DIAGNOSIS — G894 Chronic pain syndrome: Secondary | ICD-10-CM | POA: Diagnosis present

## 2019-08-16 DIAGNOSIS — Z992 Dependence on renal dialysis: Secondary | ICD-10-CM

## 2019-08-16 DIAGNOSIS — K5730 Diverticulosis of large intestine without perforation or abscess without bleeding: Secondary | ICD-10-CM | POA: Diagnosis not present

## 2019-08-16 DIAGNOSIS — Z888 Allergy status to other drugs, medicaments and biological substances status: Secondary | ICD-10-CM | POA: Diagnosis not present

## 2019-08-16 DIAGNOSIS — Z8614 Personal history of Methicillin resistant Staphylococcus aureus infection: Secondary | ICD-10-CM

## 2019-08-16 DIAGNOSIS — Z89432 Acquired absence of left foot: Secondary | ICD-10-CM

## 2019-08-16 DIAGNOSIS — D62 Acute posthemorrhagic anemia: Secondary | ICD-10-CM | POA: Diagnosis not present

## 2019-08-16 DIAGNOSIS — K5521 Angiodysplasia of colon with hemorrhage: Secondary | ICD-10-CM | POA: Diagnosis not present

## 2019-08-16 DIAGNOSIS — K7460 Unspecified cirrhosis of liver: Secondary | ICD-10-CM | POA: Diagnosis present

## 2019-08-16 DIAGNOSIS — B1920 Unspecified viral hepatitis C without hepatic coma: Secondary | ICD-10-CM | POA: Diagnosis present

## 2019-08-16 DIAGNOSIS — Z79899 Other long term (current) drug therapy: Secondary | ICD-10-CM

## 2019-08-16 DIAGNOSIS — F22 Delusional disorders: Secondary | ICD-10-CM | POA: Diagnosis present

## 2019-08-16 DIAGNOSIS — E1140 Type 2 diabetes mellitus with diabetic neuropathy, unspecified: Secondary | ICD-10-CM | POA: Diagnosis present

## 2019-08-16 DIAGNOSIS — Z20822 Contact with and (suspected) exposure to covid-19: Secondary | ICD-10-CM | POA: Diagnosis not present

## 2019-08-16 DIAGNOSIS — F319 Bipolar disorder, unspecified: Secondary | ICD-10-CM | POA: Diagnosis present

## 2019-08-16 DIAGNOSIS — E875 Hyperkalemia: Secondary | ICD-10-CM | POA: Diagnosis present

## 2019-08-16 DIAGNOSIS — Z9115 Patient's noncompliance with renal dialysis: Secondary | ICD-10-CM

## 2019-08-16 DIAGNOSIS — Z91040 Latex allergy status: Secondary | ICD-10-CM

## 2019-08-16 DIAGNOSIS — E1122 Type 2 diabetes mellitus with diabetic chronic kidney disease: Secondary | ICD-10-CM | POA: Diagnosis present

## 2019-08-16 DIAGNOSIS — D696 Thrombocytopenia, unspecified: Secondary | ICD-10-CM | POA: Diagnosis present

## 2019-08-16 DIAGNOSIS — K921 Melena: Secondary | ICD-10-CM | POA: Diagnosis not present

## 2019-08-16 DIAGNOSIS — K219 Gastro-esophageal reflux disease without esophagitis: Secondary | ICD-10-CM | POA: Diagnosis present

## 2019-08-16 DIAGNOSIS — I5042 Chronic combined systolic (congestive) and diastolic (congestive) heart failure: Secondary | ICD-10-CM | POA: Diagnosis not present

## 2019-08-16 DIAGNOSIS — I132 Hypertensive heart and chronic kidney disease with heart failure and with stage 5 chronic kidney disease, or end stage renal disease: Secondary | ICD-10-CM | POA: Diagnosis present

## 2019-08-16 DIAGNOSIS — F419 Anxiety disorder, unspecified: Secondary | ICD-10-CM | POA: Diagnosis present

## 2019-08-16 DIAGNOSIS — E785 Hyperlipidemia, unspecified: Secondary | ICD-10-CM | POA: Diagnosis present

## 2019-08-16 DIAGNOSIS — Z794 Long term (current) use of insulin: Secondary | ICD-10-CM | POA: Diagnosis not present

## 2019-08-16 DIAGNOSIS — I2510 Atherosclerotic heart disease of native coronary artery without angina pectoris: Secondary | ICD-10-CM | POA: Diagnosis present

## 2019-08-16 DIAGNOSIS — F259 Schizoaffective disorder, unspecified: Secondary | ICD-10-CM | POA: Diagnosis not present

## 2019-08-16 DIAGNOSIS — F17210 Nicotine dependence, cigarettes, uncomplicated: Secondary | ICD-10-CM | POA: Diagnosis present

## 2019-08-16 DIAGNOSIS — K5520 Angiodysplasia of colon without hemorrhage: Secondary | ICD-10-CM | POA: Diagnosis not present

## 2019-08-16 DIAGNOSIS — Z79891 Long term (current) use of opiate analgesic: Secondary | ICD-10-CM

## 2019-08-16 DIAGNOSIS — N186 End stage renal disease: Secondary | ICD-10-CM

## 2019-08-16 DIAGNOSIS — Z880 Allergy status to penicillin: Secondary | ICD-10-CM

## 2019-08-16 DIAGNOSIS — K922 Gastrointestinal hemorrhage, unspecified: Secondary | ICD-10-CM | POA: Diagnosis not present

## 2019-08-16 DIAGNOSIS — Z8673 Personal history of transient ischemic attack (TIA), and cerebral infarction without residual deficits: Secondary | ICD-10-CM

## 2019-08-16 DIAGNOSIS — K625 Hemorrhage of anus and rectum: Secondary | ICD-10-CM | POA: Diagnosis not present

## 2019-08-16 DIAGNOSIS — Z833 Family history of diabetes mellitus: Secondary | ICD-10-CM

## 2019-08-16 DIAGNOSIS — Z90710 Acquired absence of both cervix and uterus: Secondary | ICD-10-CM

## 2019-08-16 DIAGNOSIS — E7800 Pure hypercholesterolemia, unspecified: Secondary | ICD-10-CM | POA: Diagnosis present

## 2019-08-16 DIAGNOSIS — Z8249 Family history of ischemic heart disease and other diseases of the circulatory system: Secondary | ICD-10-CM

## 2019-08-16 DIAGNOSIS — E1129 Type 2 diabetes mellitus with other diabetic kidney complication: Secondary | ICD-10-CM | POA: Diagnosis present

## 2019-08-16 DIAGNOSIS — H409 Unspecified glaucoma: Secondary | ICD-10-CM | POA: Diagnosis present

## 2019-08-16 LAB — CBC WITH DIFFERENTIAL/PLATELET
Abs Immature Granulocytes: 0.01 10*3/uL (ref 0.00–0.07)
Basophils Absolute: 0 10*3/uL (ref 0.0–0.1)
Basophils Relative: 1 %
Eosinophils Absolute: 0.1 10*3/uL (ref 0.0–0.5)
Eosinophils Relative: 4 %
HCT: 14.4 % — ABNORMAL LOW (ref 36.0–46.0)
Hemoglobin: 4.4 g/dL — CL (ref 12.0–15.0)
Immature Granulocytes: 0 %
Lymphocytes Relative: 27 %
Lymphs Abs: 0.7 10*3/uL (ref 0.7–4.0)
MCH: 33.6 pg (ref 26.0–34.0)
MCHC: 30.6 g/dL (ref 30.0–36.0)
MCV: 109.9 fL — ABNORMAL HIGH (ref 80.0–100.0)
Monocytes Absolute: 0.2 10*3/uL (ref 0.1–1.0)
Monocytes Relative: 8 %
Neutro Abs: 1.6 10*3/uL — ABNORMAL LOW (ref 1.7–7.7)
Neutrophils Relative %: 60 %
Platelets: 94 10*3/uL — ABNORMAL LOW (ref 150–400)
RBC: 1.31 MIL/uL — ABNORMAL LOW (ref 3.87–5.11)
RDW: 14.6 % (ref 11.5–15.5)
WBC: 2.6 10*3/uL — ABNORMAL LOW (ref 4.0–10.5)
nRBC: 0 % (ref 0.0–0.2)

## 2019-08-16 LAB — PREPARE RBC (CROSSMATCH)

## 2019-08-16 LAB — COMPREHENSIVE METABOLIC PANEL
ALT: 31 U/L (ref 0–44)
AST: 32 U/L (ref 15–41)
Albumin: 3.2 g/dL — ABNORMAL LOW (ref 3.5–5.0)
Alkaline Phosphatase: 59 U/L (ref 38–126)
Anion gap: 22 — ABNORMAL HIGH (ref 5–15)
BUN: 122 mg/dL — ABNORMAL HIGH (ref 6–20)
CO2: 17 mmol/L — ABNORMAL LOW (ref 22–32)
Calcium: 8.6 mg/dL — ABNORMAL LOW (ref 8.9–10.3)
Chloride: 95 mmol/L — ABNORMAL LOW (ref 98–111)
Creatinine, Ser: 14.9 mg/dL — ABNORMAL HIGH (ref 0.44–1.00)
GFR calc Af Amer: 3 mL/min — ABNORMAL LOW (ref >60)
GFR calc non Af Amer: 2 mL/min — ABNORMAL LOW (ref >60)
Glucose, Bld: 110 mg/dL — ABNORMAL HIGH (ref 70–99)
Potassium: 7.3 mmol/L (ref 3.5–5.1)
Sodium: 134 mmol/L — ABNORMAL LOW (ref 135–145)
Total Bilirubin: 0.5 mg/dL (ref 0.3–1.2)
Total Protein: 6.5 g/dL (ref 6.5–8.1)

## 2019-08-16 LAB — I-STAT BETA HCG BLOOD, ED (MC, WL, AP ONLY): I-stat hCG, quantitative: 6.6 m[IU]/mL — ABNORMAL HIGH (ref <5)

## 2019-08-16 LAB — SAMPLE TO BLOOD BANK

## 2019-08-16 LAB — PROTIME-INR
INR: 1.4 — ABNORMAL HIGH (ref 0.8–1.2)
Prothrombin Time: 16.8 seconds — ABNORMAL HIGH (ref 11.4–15.2)

## 2019-08-16 MED ORDER — DEXTROSE 50 % IV SOLN
1.0000 | Freq: Once | INTRAVENOUS | Status: AC
  Administered 2019-08-16: 23:00:00 50 mL via INTRAVENOUS
  Filled 2019-08-16: qty 50

## 2019-08-16 MED ORDER — CALCIUM GLUCONATE 10 % IV SOLN
1.0000 g | Freq: Once | INTRAVENOUS | Status: AC
  Administered 2019-08-16: 1 g via INTRAVENOUS
  Filled 2019-08-16: qty 10

## 2019-08-16 MED ORDER — INSULIN ASPART 100 UNIT/ML ~~LOC~~ SOLN
5.0000 [IU] | Freq: Once | SUBCUTANEOUS | Status: AC
  Administered 2019-08-16: 5 [IU] via INTRAVENOUS

## 2019-08-16 MED ORDER — CHLORHEXIDINE GLUCONATE CLOTH 2 % EX PADS: 6.0000 | MEDICATED_PAD | Freq: Every day | CUTANEOUS | Status: DC

## 2019-08-16 MED ORDER — SODIUM CHLORIDE 0.9 % IV SOLN
10.0000 mL/h | Freq: Once | INTRAVENOUS | Status: AC
  Administered 2019-08-16: 10 mL/h via INTRAVENOUS

## 2019-08-16 NOTE — ED Provider Notes (Signed)
[facility] EMERGENCY DEPARTMENT Provider Note   CSN: [id number] Arrival date & time: 08/16/19  2021     History Chief Complaint  Patient presents with  . GI Bleeding    [name] is a 54 y.o. female.  The history is provided by the patient and medical records.    54 y.o. F with hx of anemia, ESRD on HD, depression, GERD, glaucoma, Hep C, Vit D deficiency, neuropathy, schizoaffective disorder, DM2, presenting to the ED for GI bleeding.  Patient states this has been ongoing all week, initially darker red in color, now brighter red.  She has had some intermittent lower abdominal cramping as well but denies this currently.  No vomiting.  No fever/chills.  States due to GI bleeding she has not been to dialysis in about a week.  She has been feeling SOB but denies chest pain.  Schedule is MWF at [facility] [employer].  Does report hx of GI bleeding numerous times in the past requiring transfusion, never can figure out where it is coming from per patient.  She is not currently on anticoagulation.   Has been seen by [facility] in the past for same, multiple prior endoscopies/scopes in the past year.  Past Medical History:  Diagnosis Date  . Anemia   . Anxiety   . Bipolar disorder, unspecified ([place]) 02/06/2014  . Chronic combined systolic and diastolic CHF (congestive heart failure) (HCC)    EF 40% by echo and 48% by stress test  . Chronic pain syndrome   . Depression   . ESRD on hemodialysis (HCC)    TTS  . GERD (gastroesophageal reflux disease)   . Glaucoma   . Headache    sinus headaches  . Hepatitis C   . High cholesterol   . History of hiatal hernia   . History of vitamin D deficiency 06/20/2015  . Hypertension   . MRSA infection ?2014   "on the back of my head; spread throughout my bloodstream"  . Neuropathy   . Refusal of blood transfusions as patient is Jehovah's Witness    occasionally allows transfusions.    . Schizoaffective disorder, unspecified  condition 02/08/2014  . Seizure [facility]) dx'd 2014   "don't know what kind; last one was ~ 04/2014"  . TIA (transient ischemic attack) 2010  . Type II diabetes mellitus ([place])    INSULIN DEPENDENT    Patient Active Problem List   Diagnosis Date Noted  . Acute GI bleeding 05/13/2019  . DKA (diabetic ketoacidoses) ([place]) 05/13/2019  . Symptomatic anemia 04/01/2019  . Type II diabetes mellitus with renal manifestations ([place]) 02/25/2019  . TIA (transient ischemic attack) 02/25/2019  . GERD (gastroesophageal reflux disease) 02/25/2019  . Tobacco abuse 02/25/2019  . Fluid overload 02/25/2019  . Uremia 02/25/2019  . Melena   . Rectal bleed 12/30/2018  . Gastritis and gastroduodenitis   . Benign neoplasm of ascending colon   . Benign neoplasm of transverse colon   . Benign neoplasm of descending colon   . GI bleeding 11/17/2018  . Acute blood loss anemia   . No blood products   . GI bleed 10/05/2018  . Prolonged QT interval 10/05/2018  . Localized edema due to fluid overload 09/05/2017  . Lower GI bleed   . Evaluation by psychiatric service required 07/17/2017  . Volume overload 06/03/2017  . Right supracondylar humerus fracture, with nonunion, subsequent encounter 05/01/2017  . Malnutrition of moderate degree 02/28/2017  . Leg pain, left 02/25/2017  .  Left leg pain 02/25/2017  . External hemorrhoids   . Diverticulosis of colon without hemorrhage   . Goals of care, counseling/discussion   . Palliative care encounter   . Cellulitis of left leg 01/16/2017  . Upper GI bleed 01/16/2017  . UGIB (upper gastrointestinal bleed)   . Anemia due to chronic kidney disease   . Cellulitis of left thigh 01/01/2017  . ESRD (end stage renal disease) ([place]) 01/01/2017  . Cocaine use disorder, severe, dependence ([place]) 08/29/2016  . Hepatitis C 08/29/2016  . Diabetic ulcer of calf ([place]) 08/29/2016  . Diabetic ulcer of toe ([place]) 08/29/2016  . Cerebrovascular accident (CVA) ([place]) 08/29/2016  . MDD  (major depressive disorder) 08/28/2016  . Dyslipidemia associated with type 2 diabetes mellitus ([place]) 11/10/2015  . Hyperkalemia 09/17/2015  . History of vitamin D deficiency 06/20/2015  . Long term prescription opiate use 06/20/2015  . Chronic pain syndrome 06/19/2015  . Anemia of chronic kidney failure 05/01/2015  . transmetatarsal amputation of the left foot 12/13/2014  . Unilateral visual loss 11/22/2014  . 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 ([place]) 11/22/2014  . Elevated troponin 11/09/2014  . Essential hypertension 11/09/2014  . Dizziness   . Chronic combined systolic (congestive) and diastolic (congestive) heart failure ([place])   . Chest pain 08/25/2014  . Rectal bleeding 04/23/2014  . Anemia, iron deficiency 04/13/2014  . Noncompliance 02/02/2014  . Hyponatremia 05/19/2013  . Seizure disorder ([place]) 02/09/2013    Past Surgical History:  Procedure Laterality Date  . ABDOMINAL HYSTERECTOMY  2014  . ABDOMINAL HYSTERECTOMY  2010  . AMPUTATION Left 05/21/2013   Procedure: Left Midfoot AMPUTATION;  Surgeon: Newt Minion, MD;  [facility];  Service: Orthopedics;  Laterality: Left;  Left Midfoot Amputation  . AV FISTULA PLACEMENT Left 05/04/2015   Procedure: ARTERIOVENOUS (AV) GRAFT INSERTION;  Surgeon: Angelia Mould, MD;  Location: [place];  Service: Vascular;  Laterality: Left;  . BIOPSY  11/18/2018   Procedure: BIOPSY;  Surgeon: Jerene Bears, MD;  [facility];  Service: Gastroenterology;;  . COLONOSCOPY WITH PROPOFOL N/A 06/22/2013   Procedure: COLONOSCOPY WITH PROPOFOL;  Surgeon: Jeryl Columbia, MD;  Location: [facility] ENDOSCOPY;  Service: Endoscopy;  Laterality: N/A;  . COLONOSCOPY WITH PROPOFOL N/A 11/18/2018   Procedure: COLONOSCOPY WITH PROPOFOL;  Surgeon: Jerene Bears, MD;  Location: [place];  Service: Gastroenterology;  Laterality: N/A;  . ENTEROSCOPY N/A 12/31/2018   Procedure: ENTEROSCOPY;  Surgeon: Irene Shipper, MD;  Location: [facility] ENDOSCOPY;   Service: Endoscopy;  Laterality: N/A;  Push enteroscopy at 8am (spoke with Endo)  . ESOPHAGOGASTRODUODENOSCOPY N/A 05/11/2015   Procedure: ESOPHAGOGASTRODUODENOSCOPY (EGD);  Surgeon: Laurence Spates, MD;  Location: [facility] ENDOSCOPY;  Service: Endoscopy;  Laterality: N/A;  . ESOPHAGOGASTRODUODENOSCOPY (EGD) WITH PROPOFOL N/A 11/18/2018   Procedure: ESOPHAGOGASTRODUODENOSCOPY (EGD) WITH PROPOFOL;  Surgeon: Jerene Bears, MD;  Location: [facility] ENDOSCOPY;  Service: Gastroenterology;  Laterality: N/A;  . ESOPHAGOGASTRODUODENOSCOPY (EGD) WITH PROPOFOL N/A 05/15/2019   Procedure: ESOPHAGOGASTRODUODENOSCOPY (EGD) WITH PROPOFOL;  Surgeon: Jackquline Denmark, MD;  Location: [facility] ENDOSCOPY;  Service: Endoscopy;  Laterality: N/A;  . EYE SURGERY Bilateral 2010   Lasik  . FLEXIBLE SIGMOIDOSCOPY N/A 01/17/2017   Procedure: FLEXIBLE SIGMOIDOSCOPY w/ hemorrhoid banding possible;  Surgeon: Gatha Mayer, MD;  Location: [facility] ENDOSCOPY;  Service: Endoscopy;  Laterality: N/A;  . FLEXIBLE SIGMOIDOSCOPY N/A 01/21/2019   Procedure: FLEXIBLE SIGMOIDOSCOPY;  Surgeon: Ronald Lobo, MD;  Location: [facility] ENDOSCOPY;  Service: Endoscopy;  Laterality: N/A;  Plan to  do the test unprepped, unsedated  . FOOT AMPUTATION Left    due diabetic neuropathy, could not feel ulcer on bottom of foot  . GIVENS CAPSULE STUDY N/A 01/21/2019   Procedure: GIVENS CAPSULE STUDY;  Surgeon: Ronald Lobo, MD;  Location: [facility] ENDOSCOPY;  Service: Endoscopy;  Laterality: N/A;  . IR ANGIOGRAM SELECTIVE EACH ADDITIONAL VESSEL  07/13/2019  . IR ANGIOGRAM SELECTIVE EACH ADDITIONAL VESSEL  07/13/2019  . IR ANGIOGRAM SELECTIVE EACH ADDITIONAL VESSEL  07/13/2019  . IR ANGIOGRAM SELECTIVE EACH ADDITIONAL VESSEL  07/13/2019  . IR ANGIOGRAM VISCERAL SELECTIVE  07/13/2019  . IR EMBO ART  VEN HEMORR LYMPH EXTRAV  INC GUIDE ROADMAPPING  07/13/2019  . POLYPECTOMY  11/18/2018   Procedure: POLYPECTOMY;  Surgeon: Jerene Bears, MD;  Location: [facility] ENDOSCOPY;  Service: Gastroenterology;;  .  REFRACTIVE SURGERY Bilateral 2013  . TONSILLECTOMY  1970's  . TUBAL LIGATION  2000     OB History    Gravida  0   Para  0   Term  0   Preterm  0   AB  0   Living        SAB  0   TAB  0   Ectopic  0   Multiple      Live Births              Family History  Problem Relation Age of Onset  . Hypertension Mother   . Diabetes Mother   . Hypertension Father   . Diabetes Father     Social History   Tobacco Use  . Smoking status: Current Some Day Smoker    Packs/day: 1.00    Years: 36.00    Pack years: 36.00    Types: Cigarettes  . Smokeless tobacco: Never Used  . Tobacco comment: 5 cigs in one setting  Substance Use Topics  . Alcohol use: Not Currently    Alcohol/week: 3.0 standard drinks    Types: 3 Standard drinks or equivalent per week  . Drug use: Not Currently    Types: "Crack" cocaine, Cocaine    Comment: last use >2 years ago    Home Medications Prior to Admission medications   Medication Sig Start Date End Date Taking? Authorizing Provider  acetaminophen (TYLENOL) 325 MG tablet Take 2 tablets (650 mg total) by mouth every 6 (six) hours as needed for fever, headache or moderate pain. 02/26/19   Roxan Hockey, MD  albuterol (PROVENTIL) (2.5 MG/3ML) 0.083% nebulizer solution Take 5 mg by nebulization daily as needed for wheezing or shortness of breath.    [provider]  albuterol (VENTOLIN HFA) 108 (90 Base) MCG/ACT inhaler Inhale 2 puffs into the lungs every 4 (four) hours as needed for wheezing or shortness of breath. 01/26/19   Dahal, Marlowe Aschoff, MD  allopurinol (ZYLOPRIM) 100 MG tablet Take 2 tablets (200 mg total) by mouth daily. 02/26/19 07/12/19  Roxan Hockey, MD  ALPRAZolam Duanne Moron) 0.5 MG tablet Take 0.5 mg by mouth 3 (three) times daily as needed for anxiety. 02/16/19   [provider]  amLODipine (NORVASC) 10 MG tablet Take 1 tablet (10 mg total) by mouth daily. 07/16/19   Pokhrel, Corrie Mckusick, MD  atorvastatin (LIPITOR) 40 MG  tablet Take 1 tablet (40 mg total) by mouth at bedtime. 02/26/19   Roxan Hockey, MD  citalopram (CELEXA) 20 MG tablet Take 20 mg by mouth daily.  05/21/19   [provider]  Darbepoetin Alfa (ARANESP) 200 MCG/0.4ML SOSY injection Inject 0.4 mLs (200 mcg  total) into the vein every Wednesday with hemodialysis. 01/27/19   Terrilee Croak, MD  diphenhydrAMINE (BENADRYL) 25 mg capsule Take 1 capsule (25 mg total) by mouth every 6 (six) hours as needed for up to 8 days for itching. 01/26/19 02/25/28  Dahal, Marlowe Aschoff, MD  furosemide (LASIX) 80 MG tablet Take 80 mg by mouth 2 (two) times daily.    [provider]  hydrocortisone (ANUSOL-HC) 2.5 % rectal cream Place 1 application rectally 2 (two) times daily. 04/03/19   Danford, Suann Larry, MD  hydrOXYzine (ATARAX/VISTARIL) 25 MG tablet Take 1 tablet (25 mg total) by mouth 3 (three) times daily as needed for itching or anxiety. 02/26/19   Roxan Hockey, MD  insulin aspart (NOVOLOG) 100 UNIT/ML injection 0-9 Units, Subcutaneous, 3 times daily with meals CBG < 70: implement hypoglycemia protocol CBG 70 - 120: 0 units CBG 121 - 150: 1 unit CBG 151 - 200: 2 units CBG 201 - 250: 3 units CBG 251 - 300: 5 units CBG 301 - 350: 7 units CBG 351 - 400: 9 units CBG > 400: call MD Patient taking differently: Inject 0-9 Units into the skin 3 (three) times daily with meals. 0-9 Units, Subcutaneous, 3 times daily with meals CBG < 70: implement hypoglycemia protocol CBG 70 - 120: 0 units CBG 121 - 150: 1 unit CBG 151 - 200: 2 units CBG 201 - 250: 3 units CBG 251 - 300: 5 units CBG 301 - 350: 7 units CBG 351 - 400: 9 units CBG > 400: call MD 01/26/19   Terrilee Croak, MD  insulin glargine (LANTUS) 100 UNIT/ML injection Inject 24 Units into the skin at bedtime.    [provider]  labetalol (NORMODYNE) 100 MG tablet Take 1 tablet (100 mg total) by mouth 2 (two) times daily. 07/16/19   Pokhrel, Corrie Mckusick, MD  lidocaine-prilocaine (EMLA) cream Apply 1 application  topically See admin instructions. On dialysis days 12/23/18   [provider]  methocarbamol (ROBAXIN) 500 MG tablet Take 500 mg by mouth daily as needed for muscle spasms.    [provider]  mupirocin ointment (BACTROBAN) 2 % Apply 1 application topically as needed (on arm). 04/03/19   Danford, Suann Larry, MD  Oxycodone HCl 10 MG TABS Take 1 tablet (10 mg total) by mouth 4 (four) times daily. 04/03/19   Danford, Suann Larry, MD  oxymetazoline (AFRIN) 0.05 % nasal spray Place 2 sprays into both nostrils 2 (two) times daily. 01/26/19   Terrilee Croak, MD  pantoprazole (PROTONIX) 40 MG tablet Take 1 tablet (40 mg total) by mouth daily at 6 (six) AM for 30 days. 01/26/19 05/13/19  Terrilee Croak, MD  Pramoxine-Menthol-Petrolatum (SARNA ULTRA EX) Apply 1 application topically daily as needed (itch).    [provider]  senna-docusate (SENOKOT-S) 8.6-50 MG tablet Take 2 tablets by mouth at bedtime. 02/26/19 02/26/20  Roxan Hockey, MD    Allergies    Penicillins, Gabapentin, Lyrica [pregabalin], Saphris [asenapine], Tramadol, Adhesive [tape], Latex, and Other  Review of Systems   Review of Systems  Respiratory: Positive for shortness of breath.   Gastrointestinal: Positive for blood in stool.  All other systems reviewed and are negative.   Physical Exam Updated Vital Signs BP (!) 148/64 (BP Location: Right Arm)   Pulse 67   Temp 97.7 F (36.5 C) (Oral)   Resp 15   LMP  (LMP Unknown)   SpO2 100%   Physical Exam Vitals and nursing note reviewed.  Constitutional:  Appearance: She is well-developed.  HENT:     Head: Normocephalic and atraumatic.  Eyes:     Conjunctiva/sclera: Conjunctivae normal.     Pupils: Pupils are equal, round, and reactive to light.  Cardiovascular:     Rate and Rhythm: Normal rate and regular rhythm.     Heart sounds: Normal heart sounds.  Pulmonary:     Effort: Pulmonary effort is normal.     Breath sounds: Normal breath  sounds.  Abdominal:     General: Bowel sounds are normal.     Palpations: Abdomen is soft.  Musculoskeletal:        General: Normal range of motion.     Cervical back: Normal range of motion.     Comments: Fistula LUE  Skin:    General: Skin is warm and dry.  Neurological:     Mental Status: She is alert and oriented to person, place, and time.     ED Results / Procedures / Treatments   Labs (all labs ordered are listed, but only abnormal results are displayed) Labs Reviewed  CBC WITH DIFFERENTIAL/PLATELET - Abnormal; Notable for the following components:      Result Value   WBC 2.6 (*)    RBC 1.31 (*)    Hemoglobin 4.4 (*)    HCT 14.4 (*)    MCV 109.9 (*)    Platelets 94 (*)    Neutro Abs 1.6 (*)    All other components within normal limits  COMPREHENSIVE METABOLIC PANEL - Abnormal; Notable for the following components:   Sodium 134 (*)    Potassium 7.3 (*)    Chloride 95 (*)    CO2 17 (*)    Glucose, Bld 110 (*)    BUN 122 (*)    Creatinine, Ser 14.90 (*)    Calcium 8.6 (*)    Albumin 3.2 (*)    GFR calc non Af Amer 2 (*)    GFR calc Af Amer 3 (*)    Anion gap 22 (*)    All other components within normal limits  PROTIME-INR - Abnormal; Notable for the following components:   Prothrombin Time 16.8 (*)    INR 1.4 (*)    All other components within normal limits  I-STAT BETA HCG BLOOD, ED ([facility], [facility], [facility] ONLY) - Abnormal; Notable for the following components:   I-stat hCG, quantitative 6.6 (*)    All other components within normal limits  RESPIRATORY PANEL BY RT PCR (FLU A&B, COVID)  URINALYSIS, ROUTINE W REFLEX MICROSCOPIC  SAMPLE TO BLOOD BANK  PREPARE RBC (CROSSMATCH)  TYPE AND SCREEN    EKG EKG Interpretation  Date/Time:  Monday August 16 2019 22:41:29 EST Ventricular Rate:  67 PR Interval:    QRS Duration: 120 QT Interval:  478 QTC Calculation: 505 R Axis:   115 Text Interpretation: Sinus rhythm Borderline prolonged PR interval Consider left  ventricular hypertrophy Borderline prolonged QT interval No STEMI Confirmed by Nanda Quinton [phone]) on 08/16/2019 10:43:59 PM   Radiology No results found.  Procedures Procedures (including critical care time)  CRITICAL CARE Performed by: Larene Pickett   Total critical care time: 45 minutes  Critical care time was exclusive of separately billable procedures and treating other patients.  Critical care was necessary to treat or prevent imminent or life-threatening deterioration.  Critical care was time spent personally by me on the following activities: development of treatment plan with patient and/or surrogate as well as nursing, discussions with consultants, evaluation of patient's  response to treatment, examination of patient, obtaining history from patient or surrogate, ordering and performing treatments and interventions, ordering and review of laboratory studies, ordering and review of radiographic studies, pulse oximetry and re-evaluation of patient's condition.   Medications Ordered in ED Medications  Chlorhexidine Gluconate Cloth 2 % PADS 6 each (has no administration in time range)  insulin aspart (novoLOG) injection 5 Units (5 Units Intravenous Given 08/16/19 2328)  dextrose 50 % solution 50 mL (50 mLs Intravenous Given 08/16/19 2323)  calcium gluconate inj 10% (1 g) URGENT USE ONLY! (1 g Intravenous Given 08/16/19 2330)  0.9 %  sodium chloride infusion (10 mL/hr Intravenous New Bag/Given 08/16/19 2334)    ED Course  I have reviewed the triage vital signs and the nursing notes.  Pertinent labs & imaging results that were available during my care of the patient were reviewed by me and considered in my medical decision making (see chart for details).    MDM Rules/Calculators/A&P  54 y.o. F here with GI bleeding.  Ongoing for the past week, now bright red in color.  Reports history of the same previously requiring admission/transfusion without known cause.  She is not on  anticoagulation.  States she did not attend dialysis for the past week because of this.  Has had some mild SOB but denies chest pain.  EKG here with prolonged QT and some peaked t-waves.  Hemoglobin is also 4.4, baseline is anywhere from 8-10.  Will type and screen, transfuse.  Note in chart that patient is a Jehovah's witness but she has received transfusions in the past. She is willing to proceed with transfusion today as well.  Will also give amp D50, insulin, and calcium gluconate for control of hyperkalemia given EKG changes.  Consult nephrology for dialysis.  Rapid COVID sent with anticipation of procedure.  Per chart review-- last instance of GI bleeding on record was December 2020.  She was found to have + RBC tagged scan, underwent angiogram and found to have right colonic angiodysplasia.  She had successful coil embolization at that time.  11:02 PM Discussed with nephology, Dr. Posey Pronto-- he will see patient and arrange for emergent dialysis.    Discussed with hospitalist, Dr. Marlowe Sax-- she will see patient and admit for ongoing care.    Final Clinical Impression(s) / ED Diagnoses Final diagnoses:  Hyperkalemia  Gastrointestinal hemorrhage, unspecified gastrointestinal hemorrhage type  ESRD needing dialysis [facility])    Rx / DC Orders ED Discharge Orders    None       Larene Pickett, PA-C 08/17/19 0981    Margette Fast, MD 08/17/19 1341

## 2019-08-16 NOTE — ED Triage Notes (Signed)
Patient arrived with EMS from home reports bloody stools/rectal bleeding onset last week , she missed her hemodialysis treatments this week , respirations unlabored /alert and oriented . Denies fever or chills .

## 2019-08-16 NOTE — Consult Note (Addendum)
Reason for Consult: Hyperkalemia, acute blood loss anemia in patient with ESRD Referring Physician: Nanda Quinton MD (EDP)  HPI:  54 year old African-American woman with past medical history significant for end-stage renal disease on hemodialysis, hypertension, type 2 diabetes mellitus, hepatitis C infection, schizoaffective disorder, history of seizures, history of TIA and pattern of poor adherence with dialysis in the past as well as recurrent rectal bleeding with associated acute blood loss anemia.  Presents to the emergency room with recurrent painless rectal bleeding and having missed dialysis for the past week because of the same.  In the emergency room found to have critically elevated potassium along with acute blood loss anemia with a hemoglobin of 4.4.  She denies any shortness of breath but reports some leg swelling and abdominal distention.  Denies any chest pain and has global weakness with some lightheadedness.  She was here in the hospital between 07/12/2019 and 07/16/2019 for an identical presentation.  Hemodialysis prescription: [facility], Monday/Wednesday/Friday, 3 hours 30 minutes, BFR 350/DFR 600, EDW 72 kg, 2K/2.0 calcium, UF profile #4, left forearm loop AVG, 15 G needles, 180 Optiflux.  Calcitriol 2.75 mcg 3 times weekly, Mircera 225 mcg IV every 2 weeks (last administration 07/26/2019), Venofer 50 mg IV weekly  Past Medical History:  Diagnosis Date  . Anemia   . Anxiety   . Bipolar disorder, unspecified ([place]) 02/06/2014  . Chronic combined systolic and diastolic CHF (congestive heart failure) (HCC)    EF 40% by echo and 48% by stress test  . Chronic pain syndrome   . Depression   . ESRD on hemodialysis (HCC)    TTS  . GERD (gastroesophageal reflux disease)   . Glaucoma   . Headache    sinus headaches  . Hepatitis C   . High cholesterol   . History of hiatal hernia   . History of vitamin D deficiency 06/20/2015  . Hypertension   . MRSA infection ?2014   "on the  back of my head; spread throughout my bloodstream"  . Neuropathy   . Refusal of blood transfusions as patient is Jehovah's Witness    occasionally allows transfusions.    . Schizoaffective disorder, unspecified condition 02/08/2014  . Seizure [facility]) dx'd 2014   "don't know what kind; last one was ~ 04/2014"  . TIA (transient ischemic attack) 2010  . Type II diabetes mellitus ([place])    INSULIN DEPENDENT    Past Surgical History:  Procedure Laterality Date  . ABDOMINAL HYSTERECTOMY  2014  . ABDOMINAL HYSTERECTOMY  2010  . AMPUTATION Left 05/21/2013   Procedure: Left Midfoot AMPUTATION;  Surgeon: Newt Minion, MD;  Location: [place];  Service: Orthopedics;  Laterality: Left;  Left Midfoot Amputation  . AV FISTULA PLACEMENT Left 05/04/2015   Procedure: ARTERIOVENOUS (AV) GRAFT INSERTION;  Surgeon: Angelia Mould, MD;  Location: [place];  Service: Vascular;  Laterality: Left;  . BIOPSY  11/18/2018   Procedure: BIOPSY;  Surgeon: Jerene Bears, MD;  Location: [facility] ENDOSCOPY;  Service: Gastroenterology;;  . COLONOSCOPY WITH PROPOFOL N/A 06/22/2013   Procedure: COLONOSCOPY WITH PROPOFOL;  Surgeon: Jeryl Columbia, MD;  Location: [facility] ENDOSCOPY;  Service: Endoscopy;  Laterality: N/A;  . COLONOSCOPY WITH PROPOFOL N/A 11/18/2018   Procedure: COLONOSCOPY WITH PROPOFOL;  Surgeon: Jerene Bears, MD;  Location: [place];  Service: Gastroenterology;  Laterality: N/A;  . ENTEROSCOPY N/A 12/31/2018   Procedure: ENTEROSCOPY;  Surgeon: Irene Shipper, MD;  Location: [facility] ENDOSCOPY;  Service: Endoscopy;  Laterality: N/A;  Push  enteroscopy at 8am (spoke with Endo)  . ESOPHAGOGASTRODUODENOSCOPY N/A 05/11/2015   Procedure: ESOPHAGOGASTRODUODENOSCOPY (EGD);  Surgeon: Laurence Spates, MD;  Location: [facility] ENDOSCOPY;  Service: Endoscopy;  Laterality: N/A;  . ESOPHAGOGASTRODUODENOSCOPY (EGD) WITH PROPOFOL N/A 11/18/2018   Procedure: ESOPHAGOGASTRODUODENOSCOPY (EGD) WITH PROPOFOL;  Surgeon: Jerene Bears, MD;  Location: [facility]  ENDOSCOPY;  Service: Gastroenterology;  Laterality: N/A;  . ESOPHAGOGASTRODUODENOSCOPY (EGD) WITH PROPOFOL N/A 05/15/2019   Procedure: ESOPHAGOGASTRODUODENOSCOPY (EGD) WITH PROPOFOL;  Surgeon: Jackquline Denmark, MD;  Location: [facility] ENDOSCOPY;  Service: Endoscopy;  Laterality: N/A;  . EYE SURGERY Bilateral 2010   Lasik  . FLEXIBLE SIGMOIDOSCOPY N/A 01/17/2017   Procedure: FLEXIBLE SIGMOIDOSCOPY w/ hemorrhoid banding possible;  Surgeon: Gatha Mayer, MD;  Location: [facility] ENDOSCOPY;  Service: Endoscopy;  Laterality: N/A;  . FLEXIBLE SIGMOIDOSCOPY N/A 01/21/2019   Procedure: FLEXIBLE SIGMOIDOSCOPY;  Surgeon: Ronald Lobo, MD;  Location: [facility] ENDOSCOPY;  Service: Endoscopy;  Laterality: N/A;  Plan to do the test unprepped, unsedated  . FOOT AMPUTATION Left    due diabetic neuropathy, could not feel ulcer on bottom of foot  . GIVENS CAPSULE STUDY N/A 01/21/2019   Procedure: GIVENS CAPSULE STUDY;  Surgeon: Ronald Lobo, MD;  Location: [facility] ENDOSCOPY;  Service: Endoscopy;  Laterality: N/A;  . IR ANGIOGRAM SELECTIVE EACH ADDITIONAL VESSEL  07/13/2019  . IR ANGIOGRAM SELECTIVE EACH ADDITIONAL VESSEL  07/13/2019  . IR ANGIOGRAM SELECTIVE EACH ADDITIONAL VESSEL  07/13/2019  . IR ANGIOGRAM SELECTIVE EACH ADDITIONAL VESSEL  07/13/2019  . IR ANGIOGRAM VISCERAL SELECTIVE  07/13/2019  . IR EMBO ART  VEN HEMORR LYMPH EXTRAV  INC GUIDE ROADMAPPING  07/13/2019  . POLYPECTOMY  11/18/2018   Procedure: POLYPECTOMY;  Surgeon: Jerene Bears, MD;  Location: [facility] ENDOSCOPY;  Service: Gastroenterology;;  . REFRACTIVE SURGERY Bilateral 2013  . TONSILLECTOMY  1970's  . TUBAL LIGATION  2000    Family History  Problem Relation Age of Onset  . Hypertension Mother   . Diabetes Mother   . Hypertension Father   . Diabetes Father     Social History:  reports that she has been smoking cigarettes. She has a 36.00 pack-year smoking history. She has never used smokeless tobacco. She reports previous alcohol use of about 3.0 standard  drinks of alcohol per week. She reports previous drug use. Drugs: "Crack" cocaine and Cocaine.  Allergies:  Allergies  Allergen Reactions  . Penicillins Hives and Other (See Comments)    Has patient had a PCN reaction causing immediate rash, facial/tongue/throat swelling, SOB or lightheadedness with hypotension: Yes Has patient had a PCN reaction causing severe rash involving mucus membranes or skin necrosis: No Has patient had a PCN reaction that required hospitalization No Has patient had a PCN reaction occurring within the last 10 years: No If all of the above answers are "NO", then may proceed with Cephalosporin use. Pt tolerates cefepime and ceftriaxone  . Gabapentin Itching and Swelling  . Lyrica [Pregabalin] Swelling and Other (See Comments)    Facial and leg swelling  . Saphris [Asenapine] Swelling and Other (See Comments)    Facial swelling  . Tramadol Swelling and Other (See Comments)    Leg swelling  . Adhesive [Tape] Itching    SEVERE ITCHING  . Latex Itching    SEVERE ITCHING  . Other Other (See Comments)    NO BLOOD PRODUCTS; PATIENT IS A JEHOVAH'S WITNESS    Medications:  Scheduled: . Chlorhexidine Gluconate Cloth  6 each Topical Q0600    BMP Latest Ref Rng &  Units 08/16/2019 07/16/2019 07/15/2019  Glucose 70 - 99 mg/dL 110(H) 242(H) 150(H)  BUN 6 - 20 mg/dL 122(H) 42(H) 28(H)  Creatinine 0.44 - 1.00 mg/dL 14.90(H) 5.56(H) 4.13(H)  Sodium 135 - 145 mmol/L 134(L) 135 137  Potassium 3.5 - 5.1 mmol/L 7.3(HH) 4.2 3.9  Chloride 98 - 111 mmol/L 95(L) 94(L) 98  CO2 22 - 32 mmol/L 17(L) 24 26  Calcium 8.9 - 10.3 mg/dL 8.6(L) 8.6(L) 8.2(L)   CBC Latest Ref Rng & Units 08/16/2019 07/16/2019 07/15/2019  WBC 4.0 - 10.5 K/uL 2.6(L) 4.1 3.2(L)  Hemoglobin 12.0 - 15.0 g/dL 4.4(LL) 8.7(L) 8.5(L)  Hematocrit 36.0 - 46.0 % 14.4(L) 27.5(L) 26.4(L)  Platelets 150 - 400 K/uL 94(L) 95(L) 77(L)     No results found.  Review of Systems  Constitutional: Positive for fatigue.  Negative for appetite change and fever.  HENT: Negative.   Eyes: Negative.   Respiratory: Negative.   Cardiovascular: Negative.   Gastrointestinal: Positive for abdominal distention, anal bleeding and blood in stool. Negative for diarrhea, nausea and vomiting.  Endocrine: Negative.   Genitourinary: Negative.   Musculoskeletal: Negative.   Skin: Negative.   Neurological: Positive for weakness and light-headedness. Negative for dizziness and facial asymmetry.  Hematological: Does not bruise/bleed easily.   Blood pressure (!) 153/77, pulse 67, temperature 97.7 F (36.5 C), temperature source Oral, resp. rate 19, SpO2 100 %. Physical Exam  Nursing note and vitals reviewed. Constitutional: She is oriented to person, place, and time. She appears well-developed and well-nourished. No distress.  HENT:  Head: Normocephalic and atraumatic.  Nose: Nose normal.  Mouth/Throat: Oropharynx is clear and moist.  Eyes: Pupils are equal, round, and reactive to light. Conjunctivae and EOM are normal. No scleral icterus.  Neck: No JVD present.  Cardiovascular: Normal rate, regular rhythm and normal heart sounds. Exam reveals no friction rub.  No murmur heard. Respiratory: Effort normal and breath sounds normal. She has no wheezes. She has no rales.  GI: Soft. Bowel sounds are normal. She exhibits distension. There is no abdominal tenderness. There is no rebound and no guarding.  Mild global distention  Musculoskeletal:        General: Edema present.     Cervical back: Normal range of motion and neck supple.     Comments: 1+ bilateral lower extremity edema.  Left forearm loop AVG with palpable thrill  Neurological: She is alert and oriented to person, place, and time.  Skin: Skin is warm and dry. There is pallor.  Psychiatric: She has a normal mood and affect. Her behavior is normal.    Assessment/Plan: 1.  Hyperkalemia: Secondary to missed hemodialysis treatments over the past week (nonadherence)  for which emergent hemodialysis will be undertaken today.  She has received temporizing measures including insulin/dextrose and calcium gluconate.  She will require a rapid COVID-19 test prior to hemodialysis to allow for appropriate isolation/location. 2.  Acute blood loss anemia: Secondary to rectal bleeding.  She reports this has been painless over the past week with bright red blood per rectum highly suggestive of diverticular source.  Transfuse 2 units PRBCs preferably during dialysis (May give 1 unit earlier in the emergency room). 3.  End-stage renal disease: With poor adherence to dialysis in the past and more prominently over the past week.  Undertake hemodialysis today emergently for hyperkalemia. 4.  Hypertension: Blood pressures elevated in spite of recent acute blood loss anemia.  This appears to be consistent with her volume excess seen on exam and attempt volume unloading/ultrafiltration  with hemodialysis. 5.  Secondary hyperparathyroidism: Currently n.p.o. as evaluation is undertaken of her rectal bleeding.  Resume binders with diet/vitamin D receptor analogs.  Kamry Faraci K. 08/16/2019, 11:56 PM

## 2019-08-17 DIAGNOSIS — K922 Gastrointestinal hemorrhage, unspecified: Secondary | ICD-10-CM

## 2019-08-17 DIAGNOSIS — D62 Acute posthemorrhagic anemia: Secondary | ICD-10-CM

## 2019-08-17 DIAGNOSIS — K921 Melena: Secondary | ICD-10-CM

## 2019-08-17 LAB — CBC
HCT: 21.7 % — ABNORMAL LOW (ref 36.0–46.0)
Hemoglobin: 7.1 g/dL — ABNORMAL LOW (ref 12.0–15.0)
MCH: 31.1 pg (ref 26.0–34.0)
MCHC: 32.7 g/dL (ref 30.0–36.0)
MCV: 95.2 fL (ref 80.0–100.0)
Platelets: 90 10*3/uL — ABNORMAL LOW (ref 150–400)
RBC: 2.28 MIL/uL — ABNORMAL LOW (ref 3.87–5.11)
RDW: 18.9 % — ABNORMAL HIGH (ref 11.5–15.5)
WBC: 2.5 10*3/uL — ABNORMAL LOW (ref 4.0–10.5)
nRBC: 0 % (ref 0.0–0.2)

## 2019-08-17 LAB — RENAL FUNCTION PANEL
Albumin: 3.2 g/dL — ABNORMAL LOW (ref 3.5–5.0)
Anion gap: 17 — ABNORMAL HIGH (ref 5–15)
BUN: 36 mg/dL — ABNORMAL HIGH (ref 6–20)
CO2: 24 mmol/L (ref 22–32)
Calcium: 8.4 mg/dL — ABNORMAL LOW (ref 8.9–10.3)
Chloride: 95 mmol/L — ABNORMAL LOW (ref 98–111)
Creatinine, Ser: 6.12 mg/dL — ABNORMAL HIGH (ref 0.44–1.00)
GFR calc Af Amer: 8 mL/min — ABNORMAL LOW (ref >60)
GFR calc non Af Amer: 7 mL/min — ABNORMAL LOW (ref >60)
Glucose, Bld: 76 mg/dL (ref 70–99)
Phosphorus: 4.1 mg/dL (ref 2.5–4.6)
Potassium: 3.6 mmol/L (ref 3.5–5.1)
Sodium: 136 mmol/L (ref 135–145)

## 2019-08-17 LAB — GLUCOSE, CAPILLARY
Glucose-Capillary: 86 mg/dL (ref 70–99)
Glucose-Capillary: 77 mg/dL (ref 70–99)
Glucose-Capillary: 82 mg/dL (ref 70–99)
Glucose-Capillary: 79 mg/dL (ref 70–99)
Glucose-Capillary: 95 mg/dL (ref 70–99)
Glucose-Capillary: 87 mg/dL (ref 70–99)

## 2019-08-17 LAB — RESPIRATORY PANEL BY RT PCR (FLU A&B, COVID)
Influenza A by PCR: NEGATIVE
Influenza B by PCR: NEGATIVE
SARS Coronavirus 2 by RT PCR: NEGATIVE

## 2019-08-17 LAB — TROPONIN I (HIGH SENSITIVITY)
Troponin I (High Sensitivity): 27 ng/L — ABNORMAL HIGH (ref <18)
Troponin I (High Sensitivity): 27 ng/L — ABNORMAL HIGH (ref <18)

## 2019-08-17 MED ORDER — METOCLOPRAMIDE HCL 5 MG/ML IJ SOLN
5.0000 mg | Freq: Once | INTRAMUSCULAR | Status: AC
  Administered 2019-08-17: 5 mg via INTRAVENOUS
  Filled 2019-08-17: qty 2

## 2019-08-17 MED ORDER — PEG-KCL-NACL-NASULF-NA ASC-C 100 G PO SOLR
0.5000 | Freq: Once | ORAL | Status: AC
  Administered 2019-08-17: 100 g via ORAL

## 2019-08-17 MED ORDER — ACETAMINOPHEN 650 MG RE SUPP: 650.0000 mg | Freq: Four times a day (QID) | RECTAL | Status: DC | PRN

## 2019-08-17 MED ORDER — SODIUM CHLORIDE 0.9 % IV SOLN: 100.0000 mL | INTRAVENOUS | Status: DC | PRN

## 2019-08-17 MED ORDER — PEG-KCL-NACL-NASULF-NA ASC-C 100 G PO SOLR
1.0000 | Freq: Once | ORAL | Status: DC
  Filled 2019-08-17: qty 1

## 2019-08-17 MED ORDER — LABETALOL HCL 100 MG PO TABS
100.0000 mg | ORAL_TABLET | Freq: Two times a day (BID) | ORAL | Status: DC
  Administered 2019-08-17 (×2): 100 mg via ORAL
  Filled 2019-08-17 (×2): qty 1

## 2019-08-17 MED ORDER — PENTAFLUOROPROP-TETRAFLUOROETH EX AERO: 1.0000 "application " | INHALATION_SPRAY | CUTANEOUS | Status: DC | PRN

## 2019-08-17 MED ORDER — CLONIDINE HCL 0.2 MG PO TABS
0.2000 mg | ORAL_TABLET | Freq: Once | ORAL | Status: AC
  Administered 2019-08-17: 0.2 mg via ORAL
  Filled 2019-08-17: qty 1

## 2019-08-17 MED ORDER — AMLODIPINE BESYLATE 10 MG PO TABS
10.0000 mg | ORAL_TABLET | Freq: Every day | ORAL | Status: DC
  Administered 2019-08-17 – 2019-08-18 (×2): 10 mg via ORAL
  Filled 2019-08-17 (×2): qty 1

## 2019-08-17 MED ORDER — CHLORHEXIDINE GLUCONATE CLOTH 2 % EX PADS: 6.0000 | MEDICATED_PAD | Freq: Every day | CUTANEOUS | Status: DC

## 2019-08-17 MED ORDER — PEG-KCL-NACL-NASULF-NA ASC-C 100 G PO SOLR
0.5000 | Freq: Once | ORAL | Status: AC
  Administered 2019-08-17: 100 g via ORAL
  Filled 2019-08-17: qty 1

## 2019-08-17 MED ORDER — ACETAMINOPHEN 325 MG PO TABS
650.0000 mg | ORAL_TABLET | Freq: Four times a day (QID) | ORAL | Status: DC | PRN
  Administered 2019-08-17: 650 mg via ORAL
  Filled 2019-08-17: qty 2

## 2019-08-17 MED ORDER — DARBEPOETIN ALFA 150 MCG/0.3ML IJ SOSY
150.0000 ug | PREFILLED_SYRINGE | INTRAMUSCULAR | Status: DC
  Administered 2019-08-17: 150 ug via SUBCUTANEOUS
  Filled 2019-08-17: qty 0.3

## 2019-08-17 MED ORDER — NITROGLYCERIN 0.4 MG SL SUBL
0.4000 mg | SUBLINGUAL_TABLET | SUBLINGUAL | Status: DC | PRN
  Administered 2019-08-17 (×3): 0.4 mg via SUBLINGUAL
  Filled 2019-08-17 (×3): qty 1

## 2019-08-17 MED ORDER — INSULIN GLARGINE 100 UNIT/ML ~~LOC~~ SOLN
12.0000 [IU] | Freq: Every day | SUBCUTANEOUS | Status: DC
  Filled 2019-08-17 (×2): qty 0.12

## 2019-08-17 MED ORDER — BISACODYL 5 MG PO TBEC
20.0000 mg | DELAYED_RELEASE_TABLET | Freq: Once | ORAL | Status: AC
  Administered 2019-08-17: 20 mg via ORAL
  Filled 2019-08-17: qty 4

## 2019-08-17 MED ORDER — HYDRALAZINE HCL 25 MG PO TABS
25.0000 mg | ORAL_TABLET | Freq: Three times a day (TID) | ORAL | Status: DC
  Administered 2019-08-17 – 2019-08-18 (×2): 25 mg via ORAL
  Filled 2019-08-17 (×2): qty 1

## 2019-08-17 MED ORDER — HEPARIN SODIUM (PORCINE) 1000 UNIT/ML DIALYSIS: 1000.0000 [IU] | INTRAMUSCULAR | Status: DC | PRN

## 2019-08-17 MED ORDER — LIDOCAINE HCL (PF) 1 % IJ SOLN: 5.0000 mL | INTRAMUSCULAR | Status: DC | PRN

## 2019-08-17 MED ORDER — LIDOCAINE-PRILOCAINE 2.5-2.5 % EX CREA: 1.0000 "application " | TOPICAL_CREAM | CUTANEOUS | Status: DC | PRN

## 2019-08-17 MED ORDER — INSULIN ASPART 100 UNIT/ML ~~LOC~~ SOLN
0.0000 [IU] | SUBCUTANEOUS | Status: DC
  Administered 2019-08-18: 1 [IU] via SUBCUTANEOUS

## 2019-08-17 NOTE — Progress Notes (Signed)
Patient refused cardiac monitor device. Pt educated and advised on the importance of the device used. Still, pt, refused having it. Night coverage MD made aware.

## 2019-08-17 NOTE — H&P (View-Only) (Signed)
[facility] Consult: 10:42 AM 08/17/2019  LOS: 1 day    Referring Provider: Darron Doom  Primary Care Physician:  Rocco Serene, MD Primary Gastroenterologist:  Althia Forts.  Has been seen by several different GI groups in [place] over recent years.    Reason for Consultation:  Lower GI bleed   HPI: [name] is a 54 y.o. female.  Hx ESRD.  Sporadic dialysis compliance.  IDDM.  Chronic anemia, chronic blood loss.  Jehovah's Witness who periodically allows transfusions.  Anxiety, substance abuse, schizoaffective disorder.  Cirrhosis with intermittent elevation LFTs.  Chronic thrombocytopenia.  Long history of recurrent hematochezia, melena, severe anemia.   06/2013 colonoscopy, Dr. Watt Climes.For hematochezia.Poor prep limited visualization, so lesions could have been missed however no fresh or old blood visualized. Found small internal and external hemorrhoids with a small associated, but non-bleeding,tear, likely source of the reported red blood. 05/11/15 EGD, Dr. Laurence Spates.For heme positive stool and right upper quadrant pain.Study unremarkable. Pain attributed to gallstones. Latest liver imaging: 05/2015 CT with contrast: liver normal. On 07/2015 ultrasound liver texture "coarse" as previously noted.  01/2016 Flex Sig.  Dr Carlean Purl.  Diminished rectal sphincter tone.  Small ext hemorrhoids.  Multiple sigmoid diverticuli. 10/06/2018 NM RBC bleeding scan:   No active bleeding. 11/2018 EGD.  Dr. Hilarie Fredrickson.  Mild gastritis, no active bleeding.  Biopsy reactive gastropathy, no metaplasia or H. pylori.  Mild nonbleeding duodenitis 11/2018 colonoscopy.  Dr. Hilarie Fredrickson.  A total of 4 polyps removed from ascending, transverse, descending colon.  Size from 2 to 5 mm.  Sigmoid diverticulosis.  Hemorrhoids.  No active  bleeding or stigmata of bleeding. Polyp pathology either benign colonic mucosa or hyperplastic polyps.  12/31/2018 small bowel enteroscopy.  Dr. Scarlette Shorts.  Unremarkable push enteroscopy to the proximal jejunum.  No active bleeding or source for bleeding identified.  If additional bleeding occurred recommend CT angiography 01/21/19 flexible sigmoidoscopy.  Dr. Herbie Baltimore Buccini.  Perianal skin tags, no significant hemorrhoids.  Dark stool in rectum and sigmoid, minimally burgundy tinged stool and mucosa, no frank blood or active bleeding.  Recommend video capsule endoscopy today 01/21/2019 capsule endoscopy.  Dr. Herbie Baltimore Buccini.  Duodenum reached in 3 hours, cecum reached at 4 hours.  No blood or active bleeding noted anywhere on this 12-hour study 01/22/2019 CTAP w/o contrast.  No CT explanation for bleeding.  Mild, nonspecific wall thickening at GE junction.  Capsule seen in the descending colon.  Moderate colonic stool burden.  Uncomplicated cholelithiasis.  Aortic and branch atherosclerosis. Coronary calcifications.  Cardiomegaly.  Borderline mild hepatosplenomegaly.  Normal hepato-pedal flow.  01/25/2019 ultrasound with elastography liver, doppler: Cirrhotic liver, no focal hepatic lesions.  Small gallstones.  [place] fibrosis score some F3 + F4.  Fibrosis risk high no hepatic, portal, splenic vein thrombosis or occlusion.  Minimal ascites. 01/31/2019 CTAP angogram: No active GI bleeding.  Cirrhosis, small subhepatic ascites.  Cholelithiasis.  Moderate colonic stool burden.  Small bilateral pleural effusions/compressive atelectasis lower lobes. 05/15/2019 EGD.  For melena.  Dr. Lyndel Safe.  For anemia, non-bloody N/V. Normal study.  07/13/2019 [facility] consult, Dr. Vella Redhead for dark, maroon stools, BRBPR. Hgb 5.8. 07/13/2019 nuclear medicine bleeding scan positive for right colonic bleed.   07/13/2019 IR embolization of right colic branch for bleeding in the cecum. Received 3 PRBCs.    A week ago Monday, so 8  days ago, patient had recurrence of initially burgundy, dark bloody stools and then more bright red bloody stools.  She became progressively weaker to the point she could not get out of bed.  She had not gone to dialysis for 1 week.  Did not get out of bed to eat because she was so weak.  No nausea, no vomiting.  Short of breath.  Chest pressure.  Abdominal bloating. Last stool was more than 24 hours ago. Hgb 4.4 >> 2 PRBC >> 7.1, was 8.7 on 07/16/2019 MCV 109.  Platelets 90 K, 77K on 07/15/2019. INR 1.4. COVID-19 negative. Potassium 7.3 and she went to emergency dialysis last night and potassium 3.6 today. Received 2 PRBCs during dialysis.    Patient lives alone.  She admits to smoking 10 cigarettes a day.  Says she has not smoked marijuana or crack recently she drinks occasional small amount of beer in the last time she drank less than 6 ounces of beer was on Sunday 08/08/2019.      Past Medical History:  Diagnosis Date   Anemia    Anxiety    Bipolar disorder, unspecified ([place]) 02/06/2014   Chronic combined systolic and diastolic CHF (congestive heart failure) (HCC)    EF 40% by echo and 48% by stress test   Chronic pain syndrome    Depression    ESRD on hemodialysis (HCC)    TTS   GERD (gastroesophageal reflux disease)    Glaucoma    Headache    sinus headaches   Hepatitis C    High cholesterol    History of hiatal hernia    History of vitamin D deficiency 06/20/2015   Hypertension    MRSA infection ?2014   "on the back of my head; spread throughout my bloodstream"   Neuropathy    Refusal of blood transfusions as patient is Jehovah's Witness    occasionally allows transfusions.     Schizoaffective disorder, unspecified condition 02/08/2014   Seizure ([place]) dx'd 2014   "don't know what kind; last one was ~ 04/2014"   TIA (transient ischemic attack) 2010   Type II diabetes mellitus ([place])    INSULIN DEPENDENT    Past Surgical History:  Procedure Laterality  Date   ABDOMINAL HYSTERECTOMY  2014   ABDOMINAL HYSTERECTOMY  2010   AMPUTATION Left 05/21/2013   Procedure: Left Midfoot AMPUTATION;  Surgeon: Newt Minion, MD;  Location: [facility];  Service: Orthopedics;  Laterality: Left;  Left Midfoot Amputation   AV FISTULA PLACEMENT Left 05/04/2015   Procedure: ARTERIOVENOUS (AV) GRAFT INSERTION;  Surgeon: Angelia Mould, MD;  Location: [facility];  Service: Vascular;  Laterality: Left;   BIOPSY  11/18/2018   Procedure: BIOPSY;  Surgeon: Jerene Bears, MD;  Location: [facility];  Service: Gastroenterology;;   COLONOSCOPY WITH PROPOFOL N/A 06/22/2013   Procedure: COLONOSCOPY WITH PROPOFOL;  Surgeon: Jeryl Columbia, MD;  Location: [facility];  Service: Endoscopy;  Laterality: N/A;   COLONOSCOPY WITH PROPOFOL N/A 11/18/2018   Procedure: COLONOSCOPY WITH PROPOFOL;  Surgeon: Jerene Bears, MD;  Location: [facility];  Service: Gastroenterology;  Laterality: N/A;   ENTEROSCOPY N/A 12/31/2018   Procedure: ENTEROSCOPY;  Surgeon: Irene Shipper,  MD;  Location: [facility];  Service: Endoscopy;  Laterality: N/A;  Push enteroscopy at 8am (spoke with Endo)   ESOPHAGOGASTRODUODENOSCOPY N/A 05/11/2015   Procedure: ESOPHAGOGASTRODUODENOSCOPY (EGD);  Surgeon: Laurence Spates, MD;  Location: [facility]COPY;  Service: Endoscopy;  Laterality: N/A;   ESOPHAGOGASTRODUODENOSCOPY (EGD) WITH PROPOFOL N/A 11/18/2018   Procedure: ESOPHAGOGASTRODUODENOSCOPY (EGD) WITH PROPOFOL;  Surgeon: Jerene Bears, MD;  Location: [facility]COPY;  Service: Gastroenterology;  Laterality: N/A;   ESOPHAGOGASTRODUODENOSCOPY (EGD) WITH PROPOFOL N/A 05/15/2019   Procedure: ESOPHAGOGASTRODUODENOSCOPY (EGD) WITH PROPOFOL;  Surgeon: Jackquline Denmark, MD;  Location: [facility] ENDOSCOPY;  Service: Endoscopy;  Laterality: N/A;   EYE SURGERY Bilateral 2010   Lasik   FLEXIBLE SIGMOIDOSCOPY N/A 01/17/2017   Procedure: FLEXIBLE SIGMOIDOSCOPY w/ hemorrhoid banding possible;  Surgeon: Gatha Mayer, MD;  Location:  [facility] ENDOSCOPY;  Service: Endoscopy;  Laterality: N/A;   FLEXIBLE SIGMOIDOSCOPY N/A 01/21/2019   Procedure: FLEXIBLE SIGMOIDOSCOPY;  Surgeon: Ronald Lobo, MD;  Location: [facility] ENDOSCOPY;  Service: Endoscopy;  Laterality: N/A;  Plan to do the test unprepped, unsedated   FOOT AMPUTATION Left    due diabetic neuropathy, could not feel ulcer on bottom of foot   GIVENS CAPSULE STUDY N/A 01/21/2019   Procedure: GIVENS CAPSULE STUDY;  Surgeon: Ronald Lobo, MD;  Location: [facility];  Service: Endoscopy;  Laterality: N/A;   IR ANGIOGRAM SELECTIVE EACH ADDITIONAL VESSEL  07/13/2019   IR ANGIOGRAM SELECTIVE EACH ADDITIONAL VESSEL  07/13/2019   IR ANGIOGRAM SELECTIVE EACH ADDITIONAL VESSEL  07/13/2019   IR ANGIOGRAM SELECTIVE EACH ADDITIONAL VESSEL  07/13/2019   IR ANGIOGRAM VISCERAL SELECTIVE  07/13/2019   IR EMBO ART  VEN HEMORR LYMPH EXTRAV  INC GUIDE ROADMAPPING  07/13/2019   POLYPECTOMY  11/18/2018   Procedure: POLYPECTOMY;  Surgeon: Jerene Bears, MD;  Location: [facility] ENDOSCOPY;  Service: Gastroenterology;;   REFRACTIVE SURGERY Bilateral 2013   TONSILLECTOMY  1970's   TUBAL LIGATION  2000    Prior to Admission medications   Medication Sig Start Date End Date Taking? Authorizing Provider  acetaminophen (TYLENOL) 325 MG tablet Take 2 tablets (650 mg total) by mouth every 6 (six) hours as needed for fever, headache or moderate pain. 02/26/19  Yes Emokpae, Courage, MD  albuterol (PROVENTIL) (2.5 MG/3ML) 0.083% nebulizer solution Take 5 mg by nebulization daily as needed for wheezing or shortness of breath.   Yes [provider]  albuterol (VENTOLIN HFA) 108 (90 Base) MCG/ACT inhaler Inhale 2 puffs into the lungs every 4 (four) hours as needed for wheezing or shortness of breath. 01/26/19  Yes Dahal, Marlowe Aschoff, MD  allopurinol (ZYLOPRIM) 100 MG tablet Take 2 tablets (200 mg total) by mouth daily. 02/26/19 08/15/28 Yes Emokpae, Courage, MD  ALPRAZolam Duanne Moron) 1 MG tablet Take 1 mg by mouth 3  (three) times daily as needed for anxiety.  08/05/19  Yes [provider]  amLODipine (NORVASC) 10 MG tablet Take 1 tablet (10 mg total) by mouth daily. 07/16/19  Yes Pokhrel, Laxman, MD  atorvastatin (LIPITOR) 40 MG tablet Take 1 tablet (40 mg total) by mouth at bedtime. 02/26/19  Yes Emokpae, Courage, MD  citalopram (CELEXA) 20 MG tablet Take 20 mg by mouth daily.  05/21/19  Yes [provider]  Darbepoetin Alfa (ARANESP) 200 MCG/0.4ML SOSY injection Inject 0.4 mLs (200 mcg total) into the vein every Wednesday with hemodialysis. 01/27/19  Yes Dahal, Marlowe Aschoff, MD  diphenhydrAMINE (BENADRYL) 25 mg capsule Take 1 capsule (25 mg total) by mouth every 6 (six) hours as needed for up  to 8 days for itching. 01/26/19 02/25/28 Yes Dahal, Marlowe Aschoff, MD  furosemide (LASIX) 80 MG tablet Take 80 mg by mouth 2 (two) times daily.   Yes [provider]  hydrALAZINE (APRESOLINE) 25 MG tablet Take 25 mg by mouth 3 (three) times daily. 08/04/19  Yes [provider]  hydrocortisone (ANUSOL-HC) 2.5 % rectal cream Place 1 application rectally 2 (two) times daily. 04/03/19  Yes Danford, Suann Larry, MD  hydrOXYzine (ATARAX/VISTARIL) 25 MG tablet Take 1 tablet (25 mg total) by mouth 3 (three) times daily as needed for itching or anxiety. 02/26/19  Yes Emokpae, Courage, MD  insulin aspart (NOVOLOG) 100 UNIT/ML injection 0-9 Units, Subcutaneous, 3 times daily with meals CBG < 70: implement hypoglycemia protocol CBG 70 - 120: 0 units CBG 121 - 150: 1 unit CBG 151 - 200: 2 units CBG 201 - 250: 3 units CBG 251 - 300: 5 units CBG 301 - 350: 7 units CBG 351 - 400: 9 units CBG > 400: call MD Patient taking differently: Inject 0-9 Units into the skin 3 (three) times daily with meals. 0-9 Units, Subcutaneous, 3 times daily with meals CBG < 70: implement hypoglycemia protocol CBG 70 - 120: 0 units CBG 121 - 150: 1 unit CBG 151 - 200: 2 units CBG 201 - 250: 3 units CBG 251 - 300: 5 units CBG 301 - 350: 7 units  CBG 351 - 400: 9 units CBG > 400: call MD 01/26/19  Yes Dahal, Marlowe Aschoff, MD  insulin glargine (LANTUS) 100 UNIT/ML injection Inject 24 Units into the skin at bedtime.   Yes [provider]  labetalol (NORMODYNE) 100 MG tablet Take 1 tablet (100 mg total) by mouth 2 (two) times daily. 07/16/19  Yes Pokhrel, Laxman, MD  lidocaine-prilocaine (EMLA) cream Apply 1 application topically See admin instructions. On dialysis days 12/23/18  Yes [provider]  methocarbamol (ROBAXIN) 500 MG tablet Take 500 mg by mouth daily as needed for muscle spasms.   Yes [provider]  mupirocin ointment (BACTROBAN) 2 % Apply 1 application topically as needed (on arm). 04/03/19  Yes Danford, Suann Larry, MD  oxyCODONE (ROXICODONE) 15 MG immediate release tablet Take 15 mg by mouth every 4 (four) hours as needed for pain.  08/05/19  Yes [provider]  oxymetazoline (AFRIN) 0.05 % nasal spray Place 2 sprays into both nostrils 2 (two) times daily. 01/26/19  Yes Dahal, Marlowe Aschoff, MD  pantoprazole (PROTONIX) 40 MG tablet Take 1 tablet (40 mg total) by mouth daily at 6 (six) AM for 30 days. 01/26/19 08/15/28 Yes Dahal, Marlowe Aschoff, MD  Pramoxine-Menthol-Petrolatum (SARNA ULTRA EX) Apply 1 application topically daily as needed (itch).   Yes [provider]  senna-docusate (SENOKOT-S) 8.6-50 MG tablet Take 2 tablets by mouth at bedtime. 02/26/19 02/26/20 Yes Emokpae, Courage, MD  Oxycodone HCl 10 MG TABS Take 1 tablet (10 mg total) by mouth 4 (four) times daily. Patient not taking: Reported on 08/16/2019 04/03/19   Edwin Dada, MD    Scheduled Meds:  Chlorhexidine Gluconate Cloth  6 each Topical Q0600   insulin aspart  0-6 Units Subcutaneous Q4H   insulin glargine  12 Units Subcutaneous QHS   Infusions:  sodium chloride     sodium chloride     PRN Meds: sodium chloride, sodium chloride, acetaminophen **OR** acetaminophen, lidocaine (PF), lidocaine-prilocaine, nitroGLYCERIN,  pentafluoroprop-tetrafluoroeth   Allergies as of 08/16/2019 - Review Complete 08/16/2019  Allergen Reaction Noted   Penicillins Hives and Other (See Comments) 09/30/2014  Gabapentin Itching and Swelling 02/09/2013   Lyrica [pregabalin] Swelling and Other (See Comments) 09/30/2014   Saphris [asenapine] Swelling and Other (See Comments) 09/30/2014   Tramadol Swelling and Other (See Comments) 09/30/2014   Adhesive [tape] Itching 09/09/2017   Latex Itching 09/09/2017   Other Other (See Comments) 05/01/2015    Family History  Problem Relation Age of Onset   Hypertension Mother    Diabetes Mother    Hypertension Father    Diabetes Father     Social History   Socioeconomic History   Marital status: Single    Spouse name: Not on file   Number of children: 3   Years of education: 12   Highest education level: 12th grade  Occupational History   Occupation: disabled  Tobacco Use   Smoking status: Current Some Day Smoker    Packs/day: 1.00    Years: 36.00    Pack years: 36.00    Types: Cigarettes   Smokeless tobacco: Never Used   Tobacco comment: 5 cigs in one setting  Substance and Sexual Activity   Alcohol use: Not Currently    Alcohol/week: 3.0 standard drinks    Types: 3 Standard drinks or equivalent per week   Drug use: Not Currently    Types: "Crack" cocaine, Cocaine    Comment: last use >2 years ago   Sexual activity: Not Currently    Birth control/protection: Abstinence    Comment: offered condoms 05/2019  Other Topics Concern   Not on file  Social History Narrative   ** Merged History Encounter **   Single, lives with son in a one story home. Unable to exercise. Avoids caffeine. 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       Social Determinants of Health   Financial Resource Strain:    Difficulty of Paying Living Expenses: Not on file  Food Insecurity:    Worried About Charity fundraiser  in the Last Year: Not on file   [employer] of Food in the Last Year: Not on file  Transportation Needs:    Lack of Transportation (Medical): Not on file   Lack of Transportation (Non-Medical): Not on file  Physical Activity:    Days of Exercise per Week: Not on file   Minutes of Exercise per Session: Not on file  Stress:    Feeling of Stress : Not on file  Social Connections:    Frequency of Communication with Friends and Family: Not on file   Frequency of Social Gatherings with Friends and Family: Not on file   Attends Religious Services: Not on file   Active Member of Clubs or Organizations: Not on file   Attends Archivist Meetings: Not on file   Marital Status: Not on file  Intimate Partner Violence:    Fear of Current or Ex-Partner: Not on file   Emotionally Abused: Not on file   Physically Abused: Not on file   Sexually Abused: Not on file    REVIEW OF SYSTEMS: Constitutional: See HPI. ENT:  No nose bleeds Pulm: No cough but dyspnea on exertion.  This is improved since dialysis. CV:  No palpitations, no LE edema.  GU:  No hematuria, no frequency GI: See HPI.  Abdominal bloating has improved since dialysis. Heme: See HPI. Transfusions: See HPI. Neuro:  No headaches, no peripheral tingling or numbness.  Some dizziness.  No syncope. Derm: Chronic pruritus and sores on her lower legs. Endocrine:  No sweats or  chills.  No polyuria or dysuria Immunization: Not queried.  Reviewed. Travel:  None beyond local counties in last few months.    PHYSICAL EXAM: Vital signs in last 24 hours: Vitals:   08/17/19 0855 08/17/19 0900  BP: (!) 176/84 (!) 175/85  Pulse: 94 95  Resp: 18 18  Temp:    SpO2: 96% 95%   Wt Readings from Last 3 Encounters:  07/14/19 69.8 kg  05/27/19 75 kg  05/15/19 75.4 kg    General: Patient looks chronically ill, alert, pleasant. Head:  No asymmetry, no trauma.    Eyes: No scleral icterus.  No conjunctival pallor. Ears:  Not hard of hearing Nose: No congestion, no discharge Mouth: Tongue midline.  Mucosa moist, pink, clear.  Fair dentition but lots of missing teeth. Neck: No JVD, no masses, no thyromegaly. Lungs: Clear bilaterally no labored breathing or cough. Heart: RRR.  No MRG.  S1, S2 present. Abdomen: Soft.  Not tender, not distended.  No HSM, masses, bruits, hernias..   Rectal: Deferred Musc/Skeltl: No gross joint deformity or swelling. Extremities: No CCE. Neurologic: Oriented x3.  No tremors, no asterixis.  Moves all 4 limbs, strength not tested. Skin: Multiple sores in various states of healing on the lower legs. Nodes: No cervical adenopathy. Psych: Cooperative, pleasant, fluid speech.  Intake/Output from previous day: 01/11 0701 - 01/12 0700 In: 630 [Blood:630] Out: 3000  Intake/Output this shift: No intake/output data recorded.  LAB RESULTS: Recent Labs    08/16/19 2125 08/17/19 0813  WBC 2.6* 2.5*  HGB 4.4* 7.1*  HCT 14.4* 21.7*  PLT 94* 90*   BMET Lab Results  Component Value Date   NA 136 08/17/2019   NA 134 (L) 08/16/2019   NA 135 07/16/2019   K 3.6 08/17/2019   K 7.3 (HH) 08/16/2019   K 4.2 07/16/2019   CL 95 (L) 08/17/2019   CL 95 (L) 08/16/2019   CL 94 (L) 07/16/2019   CO2 24 08/17/2019   CO2 17 (L) 08/16/2019   CO2 24 07/16/2019   GLUCOSE 76 08/17/2019   GLUCOSE 110 (H) 08/16/2019   GLUCOSE 242 (H) 07/16/2019   BUN 36 (H) 08/17/2019   BUN 122 (H) 08/16/2019   BUN 42 (H) 07/16/2019   CREATININE 6.12 (H) 08/17/2019   CREATININE 14.90 (H) 08/16/2019   CREATININE 5.56 (H) 07/16/2019   CALCIUM 8.4 (L) 08/17/2019   CALCIUM 8.6 (L) 08/16/2019   CALCIUM 8.6 (L) 07/16/2019   LFT Recent Labs    08/16/19 2125 08/17/19 0813  PROT 6.5  --   ALBUMIN 3.2* 3.2*  AST 32  --   ALT 31  --   ALKPHOS 59  --   BILITOT 0.5  --    PT/INR Lab Results  Component Value Date   INR 1.4 (H) 08/16/2019   INR 1.1 07/14/2019   INR 1.5 (H) 03/10/2019   PROTIME 17.3 (A)  12/23/2015   PROTIME 15.9 (A) 12/23/2015   Hepatitis Panel No results for input(s): HEPBSAG, HCVAB, HEPAIGM, HEPBIGM in the last 72 hours. C-Diff No components found for: CDIFF Lipase     Component Value Date/Time   LIPASE 66 (H) 05/13/2019 1812    Drugs of Abuse     Component Value Date/Time   LABOPIA NONE DETECTED 01/01/2017 0024   COCAINSCRNUR POSITIVE (A) 01/01/2017 0024   LABBENZ NONE DETECTED 01/01/2017 0024   AMPHETMU NONE DETECTED 01/01/2017 0024   THCU NONE DETECTED 01/01/2017 0024   LABBARB NONE DETECTED 01/01/2017 0024  RADIOLOGY STUDIES: No results found.    IMPRESSION:   *    Recurrent acute GI bleeding in patient who has had multiple episodes of melena, hematochezia over the past 7 years.  Multiple EGDs, colonoscopies, enteroscopy, flex sig, capsule endoscopy.   07/13/2019 IR embolization right colic branch for bleeding at the cecum.  *     Recurrent acute blood loss anemia. Pt is a Jehovah's Witness and has excepted transfusions on occasion in the past and received 3 units PRBCs during the admission in early 07/2019 Received Mircera every 2 weeks, Venofer weekly at hemodialysis Received 2 PRBCs early this morning during HD.  *   ESRD.  Noncompliance with hemodialysis salting and hyperkalemia..  Received emergent hemodialysis for hyperkalemia.  *    Hepatitis C.  01/2019 quantitative HCV 5,590,000 On 04/02/2019 CT angio of abdomen pelvis had over changes consistent with cirrhosis.  Borderline hepatosplenomegaly but no cirrhosis had not been noted on noncontrast CTAP 01/2019.  Portal gastropathy, gastric or esophageal varices on EGD in 05/2019  *    Hypertension.   PLAN:     *   Been more than 24 hours since she passed any bloody stool so doubt new clear medicine bleeding scan is going to be helpful.  Colonoscopy tomorrow. Clear liquid diet.  Movie prep, Dulcolax.   Azucena Freed  08/17/2019, 10:42 AM Phone [phone]

## 2019-08-17 NOTE — Plan of Care (Signed)
  Problem: Education: Goal: Knowledge of General Education information will improve Description Including pain rating scale, medication(s)/side effects and non-pharmacologic comfort measures Outcome: Progressing   

## 2019-08-17 NOTE — Progress Notes (Signed)
New Admission Note:   Arrival Method: from HD via stretcher Mental Orientation: A&O x4 Telemetry: Box10, CCMD notified Assessment: to be completed IV: RAC, NSL Pain: 0/10 Tubes: None Safety Measures: Safety Fall Prevention Plan has been discussed  Admission: to be completed 5 [place] Orientation: Patient has been orientated to the room, unit and staff.   Family: none at bedside  Orders to be reviewed and implemented. Will continue to monitor the patient. Call light has been placed within reach and bed alarm has been activated.

## 2019-08-17 NOTE — Progress Notes (Signed)
Patient complaining of 9/10 non-radiating chest pain. Patient states, "It feels like I'm about to have a heart attack. Like I've got a blood clot right there and it won't let the blood through." Vital signs obtained and documented. Kennon Rounds, MD notified. Orders placed for EKG, Nitroglycerin, and Troponin. Orders acknowledged and followed. Will continue to monitor.

## 2019-08-17 NOTE — H&P (Signed)
History and Physical    [name] SKA:[id number] DOB: [birth year] DOA: 08/16/2019  PCP: Rocco Serene, MD Patient coming from: Home  Chief Complaint: Rectal bleeding, missed hemodialysis  HPI: [name] is a 54 y.o. female with medical history significant of anemia, anxiety, bipolar disorder, schizoaffective disorder, depression, chronic combined systolic and diastolic congestive heart failure, chronic pain syndrome, depression, ESRD on HD TTS, GERD, hepatitis C, hyperlipidemia, hypertension, TIA, type 2 diabetes, history of GI bleed presenting to the ED for evaluation of rectal bleeding and missed hemodialysis.  Patient reports 5-day history of rectal bleeding.  States initially the blood was dark in color but has been bright red for the past few days.  She is also having some periumbilical abdominal discomfort.  She has not vomited blood.  States she has had multiple hospitalizations for rectal bleeding but the cause of the bleeding has not been found.  She missed dialysis all of last week due to her rectal bleeding.  She is not on any blood thinners.  Denies over-the-counter NSAID use.  She has had lightheadedness, exertional dyspnea and substernal chest pain for the past few days.  No chest pain at present.  ED Course: Blood pressure elevated.  Not tachycardic.  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  WBC count 2.6, did have slight leukopenia on labs done a month ago as well.  Hemoglobin 4.4, baseline in the 8-10 range.  Platelet count 94,000, chronically low.  Labs showing severe hyperkalemia with potassium 7.3.  EKG with peaked T waves and QT prolongation.  Bicarb 17.  ED provider discussed this with the patient and she was willing to proceed with blood transfusions.  2 units PRBCs ordered.  Received insulin/D50 and calcium gluconate for hyperkalemia.  Nephrology was consulted and planning urgent hemodialysis.  SARS-CoV-2 test negative.  Review of  Systems:  All systems reviewed and apart from history of presenting illness, are negative.  Past Medical History:  Diagnosis Date  . Anemia   . Anxiety   . Bipolar disorder, unspecified ([place]) 02/06/2014  . Chronic combined systolic and diastolic CHF (congestive heart failure) (HCC)    EF 40% by echo and 48% by stress test  . Chronic pain syndrome   . Depression   . ESRD on hemodialysis (HCC)    TTS  . GERD (gastroesophageal reflux disease)   . Glaucoma   . Headache    sinus headaches  . Hepatitis C   . High cholesterol   . History of hiatal hernia   . History of vitamin D deficiency 06/20/2015  . Hypertension   . MRSA infection ?2014   "on the back of my head; spread throughout my bloodstream"  . Neuropathy   . Refusal of blood transfusions as patient is Jehovah's Witness    occasionally allows transfusions.    . Schizoaffective disorder, unspecified condition 02/08/2014  . Seizure [facility]) dx'd 2014   "don't know what kind; last one was ~ 04/2014"  . TIA (transient ischemic attack) 2010  . Type II diabetes mellitus ([place])    INSULIN DEPENDENT    Past Surgical History:  Procedure Laterality Date  . ABDOMINAL HYSTERECTOMY  2014  . ABDOMINAL HYSTERECTOMY  2010  . AMPUTATION Left 05/21/2013   Procedure: Left Midfoot AMPUTATION;  Surgeon: Newt Minion, MD;  Location: [place];  Service: Orthopedics;  Laterality: Left;  Left Midfoot Amputation  . AV FISTULA PLACEMENT Left 05/04/2015   Procedure: ARTERIOVENOUS (AV) GRAFT  INSERTION;  Surgeon: Angelia Mould, MD;  Location: [place];  Service: Vascular;  Laterality: Left;  . BIOPSY  11/18/2018   Procedure: BIOPSY;  Surgeon: Jerene Bears, MD;  Location: [facility] ENDOSCOPY;  Service: Gastroenterology;;  . COLONOSCOPY WITH PROPOFOL N/A 06/22/2013   Procedure: COLONOSCOPY WITH PROPOFOL;  Surgeon: Jeryl Columbia, MD;  Location: [facility] ENDOSCOPY;  Service: Endoscopy;  Laterality: N/A;  . COLONOSCOPY WITH PROPOFOL N/A 11/18/2018   Procedure: COLONOSCOPY  WITH PROPOFOL;  Surgeon: Jerene Bears, MD;  Location: [place];  Service: Gastroenterology;  Laterality: N/A;  . ENTEROSCOPY N/A 12/31/2018   Procedure: ENTEROSCOPY;  Surgeon: Irene Shipper, MD;  Location: [facility] ENDOSCOPY;  Service: Endoscopy;  Laterality: N/A;  Push enteroscopy at 8am (spoke with Endo)  . ESOPHAGOGASTRODUODENOSCOPY N/A 05/11/2015   Procedure: ESOPHAGOGASTRODUODENOSCOPY (EGD);  Surgeon: Laurence Spates, MD;  Location: [facility] ENDOSCOPY;  Service: Endoscopy;  Laterality: N/A;  . ESOPHAGOGASTRODUODENOSCOPY (EGD) WITH PROPOFOL N/A 11/18/2018   Procedure: ESOPHAGOGASTRODUODENOSCOPY (EGD) WITH PROPOFOL;  Surgeon: Jerene Bears, MD;  Location: [facility] ENDOSCOPY;  Service: Gastroenterology;  Laterality: N/A;  . ESOPHAGOGASTRODUODENOSCOPY (EGD) WITH PROPOFOL N/A 05/15/2019   Procedure: ESOPHAGOGASTRODUODENOSCOPY (EGD) WITH PROPOFOL;  Surgeon: Jackquline Denmark, MD;  Location: [facility] ENDOSCOPY;  Service: Endoscopy;  Laterality: N/A;  . EYE SURGERY Bilateral 2010   Lasik  . FLEXIBLE SIGMOIDOSCOPY N/A 01/17/2017   Procedure: FLEXIBLE SIGMOIDOSCOPY w/ hemorrhoid banding possible;  Surgeon: Gatha Mayer, MD;  Location: [facility] ENDOSCOPY;  Service: Endoscopy;  Laterality: N/A;  . FLEXIBLE SIGMOIDOSCOPY N/A 01/21/2019   Procedure: FLEXIBLE SIGMOIDOSCOPY;  Surgeon: Ronald Lobo, MD;  Location: [facility] ENDOSCOPY;  Service: Endoscopy;  Laterality: N/A;  Plan to do the test unprepped, unsedated  . FOOT AMPUTATION Left    due diabetic neuropathy, could not feel ulcer on bottom of foot  . GIVENS CAPSULE STUDY N/A 01/21/2019   Procedure: GIVENS CAPSULE STUDY;  Surgeon: Ronald Lobo, MD;  Location: [facility] ENDOSCOPY;  Service: Endoscopy;  Laterality: N/A;  . IR ANGIOGRAM SELECTIVE EACH ADDITIONAL VESSEL  07/13/2019  . IR ANGIOGRAM SELECTIVE EACH ADDITIONAL VESSEL  07/13/2019  . IR ANGIOGRAM SELECTIVE EACH ADDITIONAL VESSEL  07/13/2019  . IR ANGIOGRAM SELECTIVE EACH ADDITIONAL VESSEL  07/13/2019  . IR ANGIOGRAM VISCERAL SELECTIVE   07/13/2019  . IR EMBO ART  VEN HEMORR LYMPH EXTRAV  INC GUIDE ROADMAPPING  07/13/2019  . POLYPECTOMY  11/18/2018   Procedure: POLYPECTOMY;  Surgeon: Jerene Bears, MD;  Location: [facility] ENDOSCOPY;  Service: Gastroenterology;;  . REFRACTIVE SURGERY Bilateral 2013  . TONSILLECTOMY  1970's  . TUBAL LIGATION  2000     reports that she has been smoking cigarettes. She has a 36.00 pack-year smoking history. She has never used smokeless tobacco. She reports previous alcohol use of about 3.0 standard drinks of alcohol per week. She reports previous drug use. Drugs: "Crack" cocaine and Cocaine.  Allergies  Allergen Reactions  . Penicillins Hives and Other (See Comments)    Has patient had a PCN reaction causing immediate rash, facial/tongue/throat swelling, SOB or lightheadedness with hypotension: Yes Has patient had a PCN reaction causing severe rash involving mucus membranes or skin necrosis: No Has patient had a PCN reaction that required hospitalization No Has patient had a PCN reaction occurring within the last 10 years: No If all of the above answers are "NO", then may proceed with Cephalosporin use. Pt tolerates cefepime and ceftriaxone  . Gabapentin Itching and Swelling  . Lyrica [Pregabalin] Swelling and Other (See Comments)    Facial  and leg swelling  . Saphris [Asenapine] Swelling and Other (See Comments)    Facial swelling  . Tramadol Swelling and Other (See Comments)    Leg swelling  . Adhesive [Tape] Itching    SEVERE ITCHING  . Latex Itching    SEVERE ITCHING  . Other Other (See Comments)    NO BLOOD PRODUCTS; PATIENT IS A JEHOVAH'S WITNESS    Family History  Problem Relation Age of Onset  . Hypertension Mother   . Diabetes Mother   . Hypertension Father   . Diabetes Father     Prior to Admission medications   Medication Sig Start Date End Date Taking? Authorizing Provider  acetaminophen (TYLENOL) 325 MG tablet Take 2 tablets (650 mg total) by mouth every 6 (six) hours as  needed for fever, headache or moderate pain. 02/26/19  Yes Emokpae, Courage, MD  albuterol (PROVENTIL) (2.5 MG/3ML) 0.083% nebulizer solution Take 5 mg by nebulization daily as needed for wheezing or shortness of breath.   Yes [provider]  albuterol (VENTOLIN HFA) 108 (90 Base) MCG/ACT inhaler Inhale 2 puffs into the lungs every 4 (four) hours as needed for wheezing or shortness of breath. 01/26/19  Yes Dahal, Marlowe Aschoff, MD  allopurinol (ZYLOPRIM) 100 MG tablet Take 2 tablets (200 mg total) by mouth daily. 02/26/19 08/15/28 Yes Emokpae, Courage, MD  ALPRAZolam Duanne Moron) 1 MG tablet Take 1 mg by mouth 3 (three) times daily as needed for anxiety.  08/05/19  Yes [provider]  amLODipine (NORVASC) 10 MG tablet Take 1 tablet (10 mg total) by mouth daily. 07/16/19  Yes Pokhrel, Laxman, MD  atorvastatin (LIPITOR) 40 MG tablet Take 1 tablet (40 mg total) by mouth at bedtime. 02/26/19  Yes Emokpae, Courage, MD  citalopram (CELEXA) 20 MG tablet Take 20 mg by mouth daily.  05/21/19  Yes [provider]  Darbepoetin Alfa (ARANESP) 200 MCG/0.4ML SOSY injection Inject 0.4 mLs (200 mcg total) into the vein every Wednesday with hemodialysis. 01/27/19  Yes Dahal, Marlowe Aschoff, MD  diphenhydrAMINE (BENADRYL) 25 mg capsule Take 1 capsule (25 mg total) by mouth every 6 (six) hours as needed for up to 8 days for itching. 01/26/19 02/25/28 Yes Dahal, Marlowe Aschoff, MD  furosemide (LASIX) 80 MG tablet Take 80 mg by mouth 2 (two) times daily.   Yes [provider]  hydrALAZINE (APRESOLINE) 25 MG tablet Take 25 mg by mouth 3 (three) times daily. 08/04/19  Yes [provider]  hydrocortisone (ANUSOL-HC) 2.5 % rectal cream Place 1 application rectally 2 (two) times daily. 04/03/19  Yes Danford, Suann Larry, MD  hydrOXYzine (ATARAX/VISTARIL) 25 MG tablet Take 1 tablet (25 mg total) by mouth 3 (three) times daily as needed for itching or anxiety. 02/26/19  Yes Emokpae, Courage, MD  insulin aspart  (NOVOLOG) 100 UNIT/ML injection 0-9 Units, Subcutaneous, 3 times daily with meals CBG < 70: implement hypoglycemia protocol CBG 70 - 120: 0 units CBG 121 - 150: 1 unit CBG 151 - 200: 2 units CBG 201 - 250: 3 units CBG 251 - 300: 5 units CBG 301 - 350: 7 units CBG 351 - 400: 9 units CBG > 400: call MD Patient taking differently: Inject 0-9 Units into the skin 3 (three) times daily with meals. 0-9 Units, Subcutaneous, 3 times daily with meals CBG < 70: implement hypoglycemia protocol CBG 70 - 120: 0 units CBG 121 - 150: 1 unit CBG 151 - 200: 2 units CBG 201 - 250: 3 units CBG 251 - 300: 5 units  CBG 301 - 350: 7 units CBG 351 - 400: 9 units CBG > 400: call MD 01/26/19  Yes Dahal, Marlowe Aschoff, MD  insulin glargine (LANTUS) 100 UNIT/ML injection Inject 24 Units into the skin at bedtime.   Yes [provider]  labetalol (NORMODYNE) 100 MG tablet Take 1 tablet (100 mg total) by mouth 2 (two) times daily. 07/16/19  Yes Pokhrel, Laxman, MD  lidocaine-prilocaine (EMLA) cream Apply 1 application topically See admin instructions. On dialysis days 12/23/18  Yes [provider]  methocarbamol (ROBAXIN) 500 MG tablet Take 500 mg by mouth daily as needed for muscle spasms.   Yes [provider]  mupirocin ointment (BACTROBAN) 2 % Apply 1 application topically as needed (on arm). 04/03/19  Yes Danford, Suann Larry, MD  oxyCODONE (ROXICODONE) 15 MG immediate release tablet Take 15 mg by mouth every 4 (four) hours as needed for pain.  08/05/19  Yes [provider]  oxymetazoline (AFRIN) 0.05 % nasal spray Place 2 sprays into both nostrils 2 (two) times daily. 01/26/19  Yes Dahal, Marlowe Aschoff, MD  pantoprazole (PROTONIX) 40 MG tablet Take 1 tablet (40 mg total) by mouth daily at 6 (six) AM for 30 days. 01/26/19 08/15/28 Yes Dahal, Marlowe Aschoff, MD  Pramoxine-Menthol-Petrolatum (SARNA ULTRA EX) Apply 1 application topically daily as needed (itch).   Yes [provider]  senna-docusate (SENOKOT-S)  8.6-50 MG tablet Take 2 tablets by mouth at bedtime. 02/26/19 02/26/20 Yes Emokpae, Courage, MD  Oxycodone HCl 10 MG TABS Take 1 tablet (10 mg total) by mouth 4 (four) times daily. Patient not taking: Reported on 08/16/2019 04/03/19   Edwin Dada, MD    Physical Exam: Vitals:   08/17/19 0400 08/17/19 0419 08/17/19 0430 08/17/19 0500  BP: (!) 199/95 (!) 188/88 (!) 197/93 (!) 194/99  Pulse: 85 84 88 91  Resp: 15  16 20   Temp:      TempSrc:      SpO2: 98%       Physical Exam  Constitutional: She is oriented to person, place, and time. She appears well-developed and well-nourished. No distress.  HENT:  Head: Normocephalic.  Eyes: Right eye exhibits no discharge. Left eye exhibits no discharge.  Cardiovascular: Normal rate, regular rhythm and intact distal pulses.  Pulmonary/Chest: Effort normal and breath sounds normal. No respiratory distress. She has no wheezes. She has no rales.  Abdominal: Soft. Bowel sounds are normal. She exhibits no distension. There is no abdominal tenderness. There is no guarding.  Musculoskeletal:        General: No edema.     Cervical back: Neck supple.  Neurological: She is alert and oriented to person, place, and time.  Skin: Skin is warm and dry. She is not diaphoretic.     Labs on Admission: I have personally reviewed following labs and imaging studies  CBC: Recent Labs  Lab 08/16/19 2125  WBC 2.6*  NEUTROABS 1.6*  HGB 4.4*  HCT 14.4*  MCV 109.9*  PLT 94*   Basic Metabolic Panel: Recent Labs  Lab 08/16/19 2125  NA 134*  K 7.3*  CL 95*  CO2 17*  GLUCOSE 110*  BUN 122*  CREATININE 14.90*  CALCIUM 8.6*   GFR: CrCl cannot be calculated (Unknown ideal weight.). Liver Function Tests: Recent Labs  Lab 08/16/19 2125  AST 32  ALT 31  ALKPHOS 59  BILITOT 0.5  PROT 6.5  ALBUMIN 3.2*   No results for input(s): LIPASE, AMYLASE in the last 168 hours. No results for input(s): AMMONIA  in the last 168 hours. Coagulation  Profile: Recent Labs  Lab 08/16/19 2125  INR 1.4*   Cardiac Enzymes: No results for input(s): CKTOTAL, CKMB, CKMBINDEX, TROPONINI in the last 168 hours. BNP (last 3 results) No results for input(s): PROBNP in the last 8760 hours. HbA1C: No results for input(s): HGBA1C in the last 72 hours. CBG: No results for input(s): GLUCAP in the last 168 hours. Lipid Profile: No results for input(s): CHOL, HDL, LDLCALC, TRIG, CHOLHDL, LDLDIRECT in the last 72 hours. Thyroid Function Tests: No results for input(s): TSH, T4TOTAL, FREET4, T3FREE, THYROIDAB in the last 72 hours. Anemia Panel: No results for input(s): VITAMINB12, FOLATE, FERRITIN, TIBC, IRON, RETICCTPCT in the last 72 hours. Urine analysis:    Component Value Date/Time   COLORURINE YELLOW 02/25/2017 1224   APPEARANCEUR HAZY (A) 02/25/2017 1224   LABSPEC 1.013 02/25/2017 1224   PHURINE 8.0 02/25/2017 1224   GLUCOSEU >=500 (A) 02/25/2017 1224   HGBUR SMALL (A) 02/25/2017 1224   BILIRUBINUR NEGATIVE 02/25/2017 1224   BILIRUBINUR neg 12/13/2014 1127   KETONESUR NEGATIVE 02/25/2017 1224   PROTEINUR >=300 (A) 02/25/2017 1224   UROBILINOGEN 0.2 05/01/2015 1252   NITRITE NEGATIVE 02/25/2017 1224   LEUKOCYTESUR NEGATIVE 02/25/2017 1224    Radiological Exams on Admission: No results found.  EKG: Independently reviewed.  Sinus rhythm, borderline PR prolongation, QTC 505.  Peaked T waves in lateral leads.  Assessment/Plan Principal Problem:   GI bleed Active Problems:   Hyperkalemia   ESRD (end stage renal disease) (HCC)   Acute blood loss anemia   Type II diabetes mellitus with renal manifestations (HCC)  Suspected lower GI bleed, acute symptomatic blood loss anemia Patient presented with complaints of bright red blood per rectum.  No hematemesis.  Concern for acute lower GI bleed.  Hemoglobin 4.4, baseline in the 8-10 range. Work-up for GI bleed done in October 2020 was unrevealing.  Patient had an EGD, flexible  sigmoidoscopy, and capsule study done at that time.  Admitted again in December 2020.  She was found to have a positive RBC tagged scan and underwent angiogram and was found to have right colonic angiodysplasia.  She had successful coil embolization done at that time. -FOBT -Type and screen -Receiving 2 unit PRBCs.  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  At present, patient is willing to proceed with blood transfusions. -Keep n.p.o. -Continues to be hemodynamically stable.  Consult GI in a.m. -Posttransfusion H&H  Severe hyperkalemia Potassium 7.3.  EKG with peaked T waves and QT prolongation.  Secondary to missed hemodialysis treatments over the past week.  She received temporizing measures including insulin/dextrose and calcium gluconate. -Nephrology consulted and currently undergoing urgent hemodialysis -Repeat labs and EKG after dialysis  Missed hemodialysis, ESRD on HD Presenting with severe hyperkalemia as mentioned above.  In addition, bicarb low at 17.  Blood pressure elevated in the setting of volume overload. -Currently undergoing urgent hemodialysis  Insulin-dependent diabetes mellitus -Very sensitive sliding scale insulin every 4 hours as patient is currently n.p.o. -Continue home Lantus at half of usual dose as patient is currently n.p.o.  Resume home p.o. meds when patient is no longer n.p.o.  DVT prophylaxis: SCDs Code Status: Patient wishes to be full code. Family Communication: No family bedside. Disposition Plan: Anticipate discharge after work-up for GI bleed is complete and improvement of electrolyte derangements after dialysis. Consults called: Nephrology Admission status: It is my clinical opinion that admission to INPATIENT is reasonable and necessary  in this 54 y.o. female . presenting with suspected lower GI bleed and acute symptomatic blood loss anemia.  Also severely hyperkalemic in the setting of missed hemodialysis.   Needs serial hemodialysis and work-up for GI bleed.  Given the aforementioned, the predictability of an adverse outcome is felt to be significant. I expect that the patient will require at least 2 midnights in the hospital to treat this condition.   The medical decision making on this patient was of high complexity and the patient is at high risk for clinical deterioration, therefore this is a level 3 visit.  Shela Leff MD [facility] Pager [phone]  If 7PM-7AM, please contact night-coverage [email or web address] Password [facility]  08/17/2019, 5:19 AM

## 2019-08-17 NOTE — Consult Note (Signed)
[facility] Consult: 10:42 AM 08/17/2019  LOS: 1 day    Referring Provider: Darron Doom  Primary Care Physician:  Rocco Serene, MD Primary Gastroenterologist:  Althia Forts.  Has been seen by several different GI groups in [place] over recent years.    Reason for Consultation:  Lower GI bleed   HPI: [name] is a 54 y.o. female.  Hx ESRD.  Sporadic dialysis compliance.  IDDM.  Chronic anemia, chronic blood loss.  Jehovah's Witness who periodically allows transfusions.  Anxiety, substance abuse, schizoaffective disorder.  Cirrhosis with intermittent elevation LFTs.  Chronic thrombocytopenia.  Long history of recurrent hematochezia, melena, severe anemia.   06/2013 colonoscopy, Dr. Watt Climes.For hematochezia.Poor prep limited visualization, so lesions could have been missed however no fresh or old blood visualized. Found small internal and external hemorrhoids with a small associated, but non-bleeding,tear, likely source of the reported red blood. 05/11/15 EGD, Dr. Laurence Spates.For heme positive stool and right upper quadrant pain.Study unremarkable. Pain attributed to gallstones. Latest liver imaging: 05/2015 CT with contrast: liver normal. On 07/2015 ultrasound liver texture "coarse" as previously noted.  01/2016 Flex Sig.  Dr Carlean Purl.  Diminished rectal sphincter tone.  Small ext hemorrhoids.  Multiple sigmoid diverticuli. 10/06/2018 NM RBC bleeding scan:   No active bleeding. 11/2018 EGD.  Dr. Hilarie Fredrickson.  Mild gastritis, no active bleeding.  Biopsy reactive gastropathy, no metaplasia or H. pylori.  Mild nonbleeding duodenitis 11/2018 colonoscopy.  Dr. Hilarie Fredrickson.  A total of 4 polyps removed from ascending, transverse, descending colon.  Size from 2 to 5 mm.  Sigmoid diverticulosis.  Hemorrhoids.  No active  bleeding or stigmata of bleeding. Polyp pathology either benign colonic mucosa or hyperplastic polyps.  12/31/2018 small bowel enteroscopy.  Dr. Scarlette Shorts.  Unremarkable push enteroscopy to the proximal jejunum.  No active bleeding or source for bleeding identified.  If additional bleeding occurred recommend CT angiography 01/21/19 flexible sigmoidoscopy.  Dr. Herbie Baltimore Buccini.  Perianal skin tags, no significant hemorrhoids.  Dark stool in rectum and sigmoid, minimally burgundy tinged stool and mucosa, no frank blood or active bleeding.  Recommend video capsule endoscopy today 01/21/2019 capsule endoscopy.  Dr. Herbie Baltimore Buccini.  Duodenum reached in 3 hours, cecum reached at 4 hours.  No blood or active bleeding noted anywhere on this 12-hour study 01/22/2019 CTAP w/o contrast.  No CT explanation for bleeding.  Mild, nonspecific wall thickening at GE junction.  Capsule seen in the descending colon.  Moderate colonic stool burden.  Uncomplicated cholelithiasis.  Aortic and branch atherosclerosis. Coronary calcifications.  Cardiomegaly.  Borderline mild hepatosplenomegaly.  Normal hepato-pedal flow.  01/25/2019 ultrasound with elastography liver, doppler: Cirrhotic liver, no focal hepatic lesions.  Small gallstones.  [place] fibrosis score some F3 + F4.  Fibrosis risk high no hepatic, portal, splenic vein thrombosis or occlusion.  Minimal ascites. 01/31/2019 CTAP angogram: No active GI bleeding.  Cirrhosis, small subhepatic ascites.  Cholelithiasis.  Moderate colonic stool burden.  Small bilateral pleural effusions/compressive atelectasis lower lobes. 05/15/2019 EGD.  For melena.  Dr. Lyndel Safe.  For anemia, non-bloody N/V. Normal study.  07/13/2019 [facility] consult, Dr. Vella Redhead for dark, maroon stools, BRBPR. Hgb 5.8. 07/13/2019 nuclear medicine bleeding scan positive for right colonic bleed.   07/13/2019 IR embolization of right colic branch for bleeding in the cecum. Received 3 PRBCs.    A week ago Monday, so 8  days ago, patient had recurrence of initially burgundy, dark bloody stools and then more bright red bloody stools.  She became progressively weaker to the point she could not get out of bed.  She had not gone to dialysis for 1 week.  Did not get out of bed to eat because she was so weak.  No nausea, no vomiting.  Short of breath.  Chest pressure.  Abdominal bloating. Last stool was more than 24 hours ago. Hgb 4.4 >> 2 PRBC >> 7.1, was 8.7 on 07/16/2019 MCV 109.  Platelets 90 K, 77K on 07/15/2019. INR 1.4. COVID-19 negative. Potassium 7.3 and she went to emergency dialysis last night and potassium 3.6 today. Received 2 PRBCs during dialysis.    Patient lives alone.  She admits to smoking 10 cigarettes a day.  Says she has not smoked marijuana or crack recently she drinks occasional small amount of beer in the last time she drank less than 6 ounces of beer was on Sunday 08/08/2019.      Past Medical History:  Diagnosis Date  . Anemia   . Anxiety   . Bipolar disorder, unspecified ([place]) 02/06/2014  . Chronic combined systolic and diastolic CHF (congestive heart failure) (HCC)    EF 40% by echo and 48% by stress test  . Chronic pain syndrome   . Depression   . ESRD on hemodialysis (HCC)    TTS  . GERD (gastroesophageal reflux disease)   . Glaucoma   . Headache    sinus headaches  . Hepatitis C   . High cholesterol   . History of hiatal hernia   . History of vitamin D deficiency 06/20/2015  . Hypertension   . MRSA infection ?2014   "on the back of my head; spread throughout my bloodstream"  . Neuropathy   . Refusal of blood transfusions as patient is Jehovah's Witness    occasionally allows transfusions.    . Schizoaffective disorder, unspecified condition 02/08/2014  . Seizure [facility]) dx'd 2014   "don't know what kind; last one was ~ 04/2014"  . TIA (transient ischemic attack) 2010  . Type II diabetes mellitus ([place])    INSULIN DEPENDENT    Past Surgical History:  Procedure Laterality  Date  . ABDOMINAL HYSTERECTOMY  2014  . ABDOMINAL HYSTERECTOMY  2010  . AMPUTATION Left 05/21/2013   Procedure: Left Midfoot AMPUTATION;  Surgeon: Newt Minion, MD;  Location: [place];  Service: Orthopedics;  Laterality: Left;  Left Midfoot Amputation  . AV FISTULA PLACEMENT Left 05/04/2015   Procedure: ARTERIOVENOUS (AV) GRAFT INSERTION;  Surgeon: Angelia Mould, MD;  Location: [place];  Service: Vascular;  Laterality: Left;  . BIOPSY  11/18/2018   Procedure: BIOPSY;  Surgeon: Jerene Bears, MD;  Location: [facility] ENDOSCOPY;  Service: Gastroenterology;;  . COLONOSCOPY WITH PROPOFOL N/A 06/22/2013   Procedure: COLONOSCOPY WITH PROPOFOL;  Surgeon: Jeryl Columbia, MD;  Location: [facility] ENDOSCOPY;  Service: Endoscopy;  Laterality: N/A;  . COLONOSCOPY WITH PROPOFOL N/A 11/18/2018   Procedure: COLONOSCOPY WITH PROPOFOL;  Surgeon: Jerene Bears, MD;  Location: [place];  Service: Gastroenterology;  Laterality: N/A;  . ENTEROSCOPY N/A 12/31/2018   Procedure: ENTEROSCOPY;  Surgeon: Irene Shipper,  MD;  Location: [place] ENDOSCOPY;  Service: Endoscopy;  Laterality: N/A;  Push enteroscopy at 8am (spoke with Endo)  . ESOPHAGOGASTRODUODENOSCOPY N/A 05/11/2015   Procedure: ESOPHAGOGASTRODUODENOSCOPY (EGD);  Surgeon: Laurence Spates, MD;  Location: [facility]COPY;  Service: Endoscopy;  Laterality: N/A;  . ESOPHAGOGASTRODUODENOSCOPY (EGD) WITH PROPOFOL N/A 11/18/2018   Procedure: ESOPHAGOGASTRODUODENOSCOPY (EGD) WITH PROPOFOL;  Surgeon: Jerene Bears, MD;  Location: [facility] ENDOSCOPY;  Service: Gastroenterology;  Laterality: N/A;  . ESOPHAGOGASTRODUODENOSCOPY (EGD) WITH PROPOFOL N/A 05/15/2019   Procedure: ESOPHAGOGASTRODUODENOSCOPY (EGD) WITH PROPOFOL;  Surgeon: Jackquline Denmark, MD;  Location: [facility] ENDOSCOPY;  Service: Endoscopy;  Laterality: N/A;  . EYE SURGERY Bilateral 2010   Lasik  . FLEXIBLE SIGMOIDOSCOPY N/A 01/17/2017   Procedure: FLEXIBLE SIGMOIDOSCOPY w/ hemorrhoid banding possible;  Surgeon: Gatha Mayer, MD;  Location:  [facility] ENDOSCOPY;  Service: Endoscopy;  Laterality: N/A;  . FLEXIBLE SIGMOIDOSCOPY N/A 01/21/2019   Procedure: FLEXIBLE SIGMOIDOSCOPY;  Surgeon: Ronald Lobo, MD;  Location: [facility] ENDOSCOPY;  Service: Endoscopy;  Laterality: N/A;  Plan to do the test unprepped, unsedated  . FOOT AMPUTATION Left    due diabetic neuropathy, could not feel ulcer on bottom of foot  . GIVENS CAPSULE STUDY N/A 01/21/2019   Procedure: GIVENS CAPSULE STUDY;  Surgeon: Ronald Lobo, MD;  Location: [facility] ENDOSCOPY;  Service: Endoscopy;  Laterality: N/A;  . IR ANGIOGRAM SELECTIVE EACH ADDITIONAL VESSEL  07/13/2019  . IR ANGIOGRAM SELECTIVE EACH ADDITIONAL VESSEL  07/13/2019  . IR ANGIOGRAM SELECTIVE EACH ADDITIONAL VESSEL  07/13/2019  . IR ANGIOGRAM SELECTIVE EACH ADDITIONAL VESSEL  07/13/2019  . IR ANGIOGRAM VISCERAL SELECTIVE  07/13/2019  . IR EMBO ART  VEN HEMORR LYMPH EXTRAV  INC GUIDE ROADMAPPING  07/13/2019  . POLYPECTOMY  11/18/2018   Procedure: POLYPECTOMY;  Surgeon: Jerene Bears, MD;  Location: [facility] ENDOSCOPY;  Service: Gastroenterology;;  . REFRACTIVE SURGERY Bilateral 2013  . TONSILLECTOMY  1970's  . TUBAL LIGATION  2000    Prior to Admission medications   Medication Sig Start Date End Date Taking? Authorizing Provider  acetaminophen (TYLENOL) 325 MG tablet Take 2 tablets (650 mg total) by mouth every 6 (six) hours as needed for fever, headache or moderate pain. 02/26/19  Yes Emokpae, Courage, MD  albuterol (PROVENTIL) (2.5 MG/3ML) 0.083% nebulizer solution Take 5 mg by nebulization daily as needed for wheezing or shortness of breath.   Yes [provider]  albuterol (VENTOLIN HFA) 108 (90 Base) MCG/ACT inhaler Inhale 2 puffs into the lungs every 4 (four) hours as needed for wheezing or shortness of breath. 01/26/19  Yes Dahal, Marlowe Aschoff, MD  allopurinol (ZYLOPRIM) 100 MG tablet Take 2 tablets (200 mg total) by mouth daily. 02/26/19 08/15/28 Yes Emokpae, Courage, MD  ALPRAZolam Duanne Moron) 1 MG tablet Take 1 mg by mouth 3  (three) times daily as needed for anxiety.  08/05/19  Yes [provider]  amLODipine (NORVASC) 10 MG tablet Take 1 tablet (10 mg total) by mouth daily. 07/16/19  Yes Pokhrel, Laxman, MD  atorvastatin (LIPITOR) 40 MG tablet Take 1 tablet (40 mg total) by mouth at bedtime. 02/26/19  Yes Emokpae, Courage, MD  citalopram (CELEXA) 20 MG tablet Take 20 mg by mouth daily.  05/21/19  Yes [provider]  Darbepoetin Alfa (ARANESP) 200 MCG/0.4ML SOSY injection Inject 0.4 mLs (200 mcg total) into the vein every Wednesday with hemodialysis. 01/27/19  Yes Dahal, Marlowe Aschoff, MD  diphenhydrAMINE (BENADRYL) 25 mg capsule Take 1 capsule (25 mg total) by mouth every 6 (six) hours as needed for up  to 8 days for itching. 01/26/19 02/25/28 Yes Dahal, Marlowe Aschoff, MD  furosemide (LASIX) 80 MG tablet Take 80 mg by mouth 2 (two) times daily.   Yes [provider]  hydrALAZINE (APRESOLINE) 25 MG tablet Take 25 mg by mouth 3 (three) times daily. 08/04/19  Yes [provider]  hydrocortisone (ANUSOL-HC) 2.5 % rectal cream Place 1 application rectally 2 (two) times daily. 04/03/19  Yes Danford, Suann Larry, MD  hydrOXYzine (ATARAX/VISTARIL) 25 MG tablet Take 1 tablet (25 mg total) by mouth 3 (three) times daily as needed for itching or anxiety. 02/26/19  Yes Emokpae, Courage, MD  insulin aspart (NOVOLOG) 100 UNIT/ML injection 0-9 Units, Subcutaneous, 3 times daily with meals CBG < 70: implement hypoglycemia protocol CBG 70 - 120: 0 units CBG 121 - 150: 1 unit CBG 151 - 200: 2 units CBG 201 - 250: 3 units CBG 251 - 300: 5 units CBG 301 - 350: 7 units CBG 351 - 400: 9 units CBG > 400: call MD Patient taking differently: Inject 0-9 Units into the skin 3 (three) times daily with meals. 0-9 Units, Subcutaneous, 3 times daily with meals CBG < 70: implement hypoglycemia protocol CBG 70 - 120: 0 units CBG 121 - 150: 1 unit CBG 151 - 200: 2 units CBG 201 - 250: 3 units CBG 251 - 300: 5 units CBG 301 - 350: 7 units  CBG 351 - 400: 9 units CBG > 400: call MD 01/26/19  Yes Dahal, Marlowe Aschoff, MD  insulin glargine (LANTUS) 100 UNIT/ML injection Inject 24 Units into the skin at bedtime.   Yes [provider]  labetalol (NORMODYNE) 100 MG tablet Take 1 tablet (100 mg total) by mouth 2 (two) times daily. 07/16/19  Yes Pokhrel, Laxman, MD  lidocaine-prilocaine (EMLA) cream Apply 1 application topically See admin instructions. On dialysis days 12/23/18  Yes [provider]  methocarbamol (ROBAXIN) 500 MG tablet Take 500 mg by mouth daily as needed for muscle spasms.   Yes [provider]  mupirocin ointment (BACTROBAN) 2 % Apply 1 application topically as needed (on arm). 04/03/19  Yes Danford, Suann Larry, MD  oxyCODONE (ROXICODONE) 15 MG immediate release tablet Take 15 mg by mouth every 4 (four) hours as needed for pain.  08/05/19  Yes [provider]  oxymetazoline (AFRIN) 0.05 % nasal spray Place 2 sprays into both nostrils 2 (two) times daily. 01/26/19  Yes Dahal, Marlowe Aschoff, MD  pantoprazole (PROTONIX) 40 MG tablet Take 1 tablet (40 mg total) by mouth daily at 6 (six) AM for 30 days. 01/26/19 08/15/28 Yes Dahal, Marlowe Aschoff, MD  Pramoxine-Menthol-Petrolatum (SARNA ULTRA EX) Apply 1 application topically daily as needed (itch).   Yes [provider]  senna-docusate (SENOKOT-S) 8.6-50 MG tablet Take 2 tablets by mouth at bedtime. 02/26/19 02/26/20 Yes Emokpae, Courage, MD  Oxycodone HCl 10 MG TABS Take 1 tablet (10 mg total) by mouth 4 (four) times daily. Patient not taking: Reported on 08/16/2019 04/03/19   Edwin Dada, MD    Scheduled Meds: . Chlorhexidine Gluconate Cloth  6 each Topical Q0600  . insulin aspart  0-6 Units Subcutaneous Q4H  . insulin glargine  12 Units Subcutaneous QHS   Infusions: . sodium chloride    . sodium chloride     PRN Meds: sodium chloride, sodium chloride, acetaminophen **OR** acetaminophen, lidocaine (PF), lidocaine-prilocaine, nitroGLYCERIN,  pentafluoroprop-tetrafluoroeth   Allergies as of 08/16/2019 - Review Complete 08/16/2019  Allergen Reaction Noted  . Penicillins Hives and Other (See Comments) 09/30/2014  .  Gabapentin Itching and Swelling 02/09/2013  . Lyrica [pregabalin] Swelling and Other (See Comments) 09/30/2014  . Saphris [asenapine] Swelling and Other (See Comments) 09/30/2014  . Tramadol Swelling and Other (See Comments) 09/30/2014  . Adhesive [tape] Itching 09/09/2017  . Latex Itching 09/09/2017  . Other Other (See Comments) 05/01/2015    Family History  Problem Relation Age of Onset  . Hypertension Mother   . Diabetes Mother   . Hypertension Father   . Diabetes Father     Social History   Socioeconomic History  . Marital status: Single    Spouse name: Not on file  . Number of children: 3  . Years of education: 10  . Highest education level: 12th grade  Occupational History  . Occupation: disabled  Tobacco Use  . Smoking status: Current Some Day Smoker    Packs/day: 1.00    Years: 36.00    Pack years: 36.00    Types: Cigarettes  . Smokeless tobacco: Never Used  . Tobacco comment: 5 cigs in one setting  Substance and Sexual Activity  . Alcohol use: Not Currently    Alcohol/week: 3.0 standard drinks    Types: 3 Standard drinks or equivalent per week  . Drug use: Not Currently    Types: "Crack" cocaine, Cocaine    Comment: last use >2 years ago  . Sexual activity: Not Currently    Birth control/protection: Abstinence    Comment: offered condoms 05/2019  Other Topics Concern  . Not on file  Social History Narrative   ** Merged History Encounter **   Single, lives with son in a one story home. Unable to exercise. Avoids caffeine. 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       Social Determinants of Health   Financial Resource Strain:   . Difficulty of Paying Living Expenses: Not on file  Food Insecurity:   . Worried About Charity fundraiser  in the Last Year: Not on file  . Ran Out of Food in the Last Year: Not on file  Transportation Needs:   . Lack of Transportation (Medical): Not on file  . Lack of Transportation (Non-Medical): Not on file  Physical Activity:   . Days of Exercise per Week: Not on file  . Minutes of Exercise per Session: Not on file  Stress:   . Feeling of Stress : Not on file  Social Connections:   . Frequency of Communication with Friends and Family: Not on file  . Frequency of Social Gatherings with Friends and Family: Not on file  . Attends Religious Services: Not on file  . Active Member of Clubs or Organizations: Not on file  . Attends Archivist Meetings: Not on file  . Marital Status: Not on file  Intimate Partner Violence:   . Fear of Current or Ex-Partner: Not on file  . Emotionally Abused: Not on file  . Physically Abused: Not on file  . Sexually Abused: Not on file    REVIEW OF SYSTEMS: Constitutional: See HPI. ENT:  No nose bleeds Pulm: No cough but dyspnea on exertion.  This is improved since dialysis. CV:  No palpitations, no LE edema.  GU:  No hematuria, no frequency GI: See HPI.  Abdominal bloating has improved since dialysis. Heme: See HPI. Transfusions: See HPI. Neuro:  No headaches, no peripheral tingling or numbness.  Some dizziness.  No syncope. Derm: Chronic pruritus and sores on her lower legs. Endocrine:  No sweats or  chills.  No polyuria or dysuria Immunization: Not queried.  Reviewed. Travel:  None beyond local counties in last few months.    PHYSICAL EXAM: Vital signs in last 24 hours: Vitals:   08/17/19 0855 08/17/19 0900  BP: (!) 176/84 (!) 175/85  Pulse: 94 95  Resp: 18 18  Temp:    SpO2: 96% 95%   Wt Readings from Last 3 Encounters:  07/14/19 69.8 kg  05/27/19 75 kg  05/15/19 75.4 kg    General: Patient looks chronically ill, alert, pleasant. Head:  No asymmetry, no trauma.    Eyes: No scleral icterus.  No conjunctival pallor. Ears:  Not hard of hearing Nose: No congestion, no discharge Mouth: Tongue midline.  Mucosa moist, pink, clear.  Fair dentition but lots of missing teeth. Neck: No JVD, no masses, no thyromegaly. Lungs: Clear bilaterally no labored breathing or cough. Heart: RRR.  No MRG.  S1, S2 present. Abdomen: Soft.  Not tender, not distended.  No HSM, masses, bruits, hernias..   Rectal: Deferred Musc/Skeltl: No gross joint deformity or swelling. Extremities: No CCE. Neurologic: Oriented x3.  No tremors, no asterixis.  Moves all 4 limbs, strength not tested. Skin: Multiple sores in various states of healing on the lower legs. Nodes: No cervical adenopathy. Psych: Cooperative, pleasant, fluid speech.  Intake/Output from previous day: 01/11 0701 - 01/12 0700 In: 630 [Blood:630] Out: 3000  Intake/Output this shift: No intake/output data recorded.  LAB RESULTS: Recent Labs    08/16/19 2125 08/17/19 0813  WBC 2.6* 2.5*  HGB 4.4* 7.1*  HCT 14.4* 21.7*  PLT 94* 90*   BMET Lab Results  Component Value Date   NA 136 08/17/2019   NA 134 (L) 08/16/2019   NA 135 07/16/2019   K 3.6 08/17/2019   K 7.3 (HH) 08/16/2019   K 4.2 07/16/2019   CL 95 (L) 08/17/2019   CL 95 (L) 08/16/2019   CL 94 (L) 07/16/2019   CO2 24 08/17/2019   CO2 17 (L) 08/16/2019   CO2 24 07/16/2019   GLUCOSE 76 08/17/2019   GLUCOSE 110 (H) 08/16/2019   GLUCOSE 242 (H) 07/16/2019   BUN 36 (H) 08/17/2019   BUN 122 (H) 08/16/2019   BUN 42 (H) 07/16/2019   CREATININE 6.12 (H) 08/17/2019   CREATININE 14.90 (H) 08/16/2019   CREATININE 5.56 (H) 07/16/2019   CALCIUM 8.4 (L) 08/17/2019   CALCIUM 8.6 (L) 08/16/2019   CALCIUM 8.6 (L) 07/16/2019   LFT Recent Labs    08/16/19 2125 08/17/19 0813  PROT 6.5  --   ALBUMIN 3.2* 3.2*  AST 32  --   ALT 31  --   ALKPHOS 59  --   BILITOT 0.5  --    PT/INR Lab Results  Component Value Date   INR 1.4 (H) 08/16/2019   INR 1.1 07/14/2019   INR 1.5 (H) 03/10/2019   PROTIME 17.3 (A)  12/23/2015   PROTIME 15.9 (A) 12/23/2015   Hepatitis Panel No results for input(s): HEPBSAG, HCVAB, HEPAIGM, HEPBIGM in the last 72 hours. C-Diff No components found for: CDIFF Lipase     Component Value Date/Time   LIPASE 66 (H) 05/13/2019 1812    Drugs of Abuse     Component Value Date/Time   LABOPIA NONE DETECTED 01/01/2017 0024   COCAINSCRNUR POSITIVE (A) 01/01/2017 0024   LABBENZ NONE DETECTED 01/01/2017 0024   AMPHETMU NONE DETECTED 01/01/2017 0024   THCU NONE DETECTED 01/01/2017 0024   LABBARB NONE DETECTED 01/01/2017 0024  RADIOLOGY STUDIES: No results found.    IMPRESSION:   *    Recurrent acute GI bleeding in patient who has had multiple episodes of melena, hematochezia over the past 7 years.  Multiple EGDs, colonoscopies, enteroscopy, flex sig, capsule endoscopy.   07/13/2019 IR embolization right colic branch for bleeding at the cecum.  *     Recurrent acute blood loss anemia. Pt is a Jehovah's Witness and has excepted transfusions on occasion in the past and received 3 units PRBCs during the admission in early 07/2019 Received Mircera every 2 weeks, Venofer weekly at hemodialysis Received 2 PRBCs early this morning during HD.  *   ESRD.  Noncompliance with hemodialysis salting and hyperkalemia..  Received emergent hemodialysis for hyperkalemia.  *    Hepatitis C.  01/2019 quantitative HCV 5,590,000 On 04/02/2019 CT angio of abdomen pelvis had over changes consistent with cirrhosis.  Borderline hepatosplenomegaly but no cirrhosis had not been noted on noncontrast CTAP 01/2019.  Portal gastropathy, gastric or esophageal varices on EGD in 05/2019  *    Hypertension.   PLAN:     *   Been more than 24 hours since she passed any bloody stool so doubt new clear medicine bleeding scan is going to be helpful.  Colonoscopy tomorrow. Clear liquid diet.  Movie prep, Dulcolax.   Azucena Freed  08/17/2019, 10:42 AM Phone [phone]

## 2019-08-17 NOTE — Progress Notes (Signed)
[facility] Progress Note  Subjective: feeling a lot better, had HD yest w/ 3L off and got prbc's w/ Hb 7.1 this am.   Vitals:   08/17/19 0800 08/17/19 0839 08/17/19 0855 08/17/19 0900  BP: (!) 198/89 (!) 176/88 (!) 176/84 (!) 175/85  Pulse: 92 94 94 95  Resp:  '18 18 18  '$ Temp: 98.6 F (37 C)     TempSrc: Oral     SpO2: 98% 98% 96% 95%    Exam: Alert, no distress, lying flat  no jvd   Chest cta bilat    Cor reg no mrg   abd soft ntnd   Ext trace - 1+ edema   LFA AVG+bruit   NF, ox 3    Dialysis: MWF G-O  3.5h  350/600  72kg  2/2 bath  P4  L AVG    - calcitriol 2.75ug  - mircera 225 q2wks last 12/21   - venofer 50/wk   Assessment/ Plan: 1. GIB/ ABL anemia - got 2u prbc yest and Hb 7.1. Per pmd 2. ESRD - on HD MWF. Had HD last night. Hd Wed.  3. Vol/ HTN - will restart her BP meds ( norvasc/ labet/ hydralazine), BP's high, no bed wt's, stable on exam. Needs weights 4. Anemia ckd - due for esa, start 150 darbe weekly 5. MBD ckd - cont meds     Kelly Splinter 08/17/2019, 2:47 PM  Inpatient medications: . Chlorhexidine Gluconate Cloth  6 each Topical Q0600  . insulin aspart  0-6 Units Subcutaneous Q4H  . insulin glargine  12 Units Subcutaneous QHS  . metoCLOPramide (REGLAN) injection  5 mg Intravenous Once   Followed by  . metoCLOPramide (REGLAN) injection  5 mg Intravenous Once  . peg 3350 powder  0.5 kit Oral Once   And  . [START ON 08/18/2019] peg 3350 powder  0.5 kit Oral Once   . sodium chloride    . sodium chloride     sodium chloride, sodium chloride, acetaminophen **OR** acetaminophen, lidocaine (PF), lidocaine-prilocaine, nitroGLYCERIN, pentafluoroprop-tetrafluoroeth

## 2019-08-17 NOTE — ED Notes (Signed)
Report called to receiving nurse on 32M, Gaynelle Adu

## 2019-08-17 NOTE — Progress Notes (Signed)
PROGRESS NOTE    [name]  DGU:[id number] DOB: 12-20-1965 DOA: 08/16/2019 PCP: Rocco Serene, MD  Brief Narrative:  [name] is a 54 y.o. female with medical history significant of anemia, anxiety, bipolar disorder, schizoaffective disorder, depression, chronic combined systolic and diastolic congestive heart failure, chronic pain syndrome, depression, ESRD on HD TTS, GERD, hepatitis C, hyperlipidemia, hypertension, TIA, type 2 diabetes, history of GI bleed presenting to the ED for evaluation of rectal bleeding and missed hemodialysis.  Patient reports 5-day history of rectal bleeding. States initially the blood was dark in color but has been bright red for the past few days.  She is also having some periumbilical abdominal discomfort.  She has not vomited blood.  States she has had multiple hospitalizations for rectal bleeding but the cause of the bleeding has not been found.  She missed dialysis all of last week due to her rectal bleeding.  She is not on any blood thinners.  Denies over-the-counter NSAID use.  She has had lightheadedness, exertional dyspnea and substernal chest pain for the past few days.  No chest pain at present. Last hospitalization, she had IR coil ablation to right colon.  ED Course: Blood pressure elevated.  Not tachycardic.  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  WBC count 2.6, did have slight leukopenia on labs done a month ago as well.  Hemoglobin 4.4, baseline in the 8-10 range.  Platelet count 94,000, chronically low.  Labs showing severe hyperkalemia with potassium 7.3.  EKG with peaked T waves and QT prolongation.  Bicarb 17.  ED provider discussed this with the patient and she was willing to proceed with blood transfusions.  2 units PRBCs ordered.  Received insulin/D50 and calcium gluconate for hyperkalemia.  Nephrology was consulted and underwent urgent hemodialysis.  SARS-CoV-2 test negative.  No further bleeding. GI  consulted and planning colonoscopy in am.  Assessment & Plan:   Principal Problem:   GI bleed Active Problems:   Hyperkalemia   ESRD (end stage renal disease) (HCC)   Acute blood loss anemia   Type II diabetes mellitus with renal manifestations (HCC)  Suspected lower GI bleed  acute symptomatic blood loss anemia Patient presented with complaints of bright red blood per rectum. No hematemesis.  Concern for acute lower GI bleed.  Hemoglobin 4.4, baseline in the 8-10 range. Work-up for GI bleed done in October 2020 was unrevealing.  Patient had an EGD, flexible sigmoidoscopy, and capsule study done at that time.  Admitted again in December 2020.  She was found to have a positive RBC tagged scan and underwent angiogram and was found to have right colonic angiodysplasia.  She had successful coil embolization done at that time. -FOBT -Type and screen -Receiving 2 unit PRBCs.  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  At present, patient is willing to proceed with blood transfusions. -Keep n.p.o. -Continues to be hemodynamically stable.  Consult GI in a.m.Marland Kitchendone -Posttransfusion H&H 7.1  Severe hyperkalemia Potassium 7.3.  EKG with peaked T waves and QT prolongation.  Secondary to missed hemodialysis treatments over the past week.  She received temporizing measures including insulin/dextrose and calcium gluconate. -Nephrology consulted and currently undergoing urgent hemodialysis -Repeat labs and EKG after dialysis>>K+ down to 3.6 after dialysis  ESRD on HD Presenting with severe hyperkalemia as mentioned above.  In addition, bicarb low at 17.  Blood pressure elevated in the setting of volume overload. -Currently  undergoing urgent hemodialysis  Insulin-dependent diabetes mellitusI -Very sensitive sliding scale insulin every 4 hours as patient is currently n.p.o. -Continue home Lantus at half of usual dose    DVT prophylaxis: SCDs Code Status:  Full Family Communication: Patient Disposition Plan: Home   Consultants:   [facility]  Nephrology  Procedures:   HD  Antimicrobials: Anti-infectives (From admission, onward)   None     Subjective: No further bleeding. Post transfusion H/H stable. Feels well following HD.  Objective: Vitals:   08/17/19 0800 08/17/19 0839 08/17/19 0855 08/17/19 0900  BP: (!) 198/89 (!) 176/88 (!) 176/84 (!) 175/85  Pulse: 92 94 94 95  Resp:  '18 18 18  '$ Temp: 98.6 F (37 C)     TempSrc: Oral     SpO2: 98% 98% 96% 95%    Intake/Output Summary (Last 24 hours) at 08/17/2019 1754 Last data filed at 08/17/2019 1230 Gross per 24 hour  Intake 750 ml  Output 3000 ml  Net -2250 ml   Filed Weights    Examination:  General exam: Appears calm and comfortable  Respiratory system: Clear to auscultation. Respiratory effort normal. Cardiovascular system: S1 & S2 heard, RRR Gastrointestinal system: Abdomen is nondistended, soft and nontender.  Central nervous system: Alert and oriented. No focal neurological deficits. Extremities: Symmetric 5 x 5 power. Skin: No rashes, lesions or ulcers  Data Reviewed: I have personally reviewed following labs and imaging studies  CBC: Recent Labs  Lab 08/16/19 2125 08/17/19 0813  WBC 2.6* 2.5*  NEUTROABS 1.6*  --   HGB 4.4* 7.1*  HCT 14.4* 21.7*  MCV 109.9* 95.2  PLT 94* 90*   Basic Metabolic Panel: Recent Labs  Lab 08/16/19 2125 08/17/19 0813  NA 134* 136  K 7.3* 3.6  CL 95* 95*  CO2 17* 24  GLUCOSE 110* 76  BUN 122* 36*  CREATININE 14.90* 6.12*  CALCIUM 8.6* 8.4*  PHOS  --  4.1   GFR: CrCl cannot be calculated (Unknown ideal weight.). Liver Function Tests: Recent Labs  Lab 08/16/19 2125 08/17/19 0813  AST 32  --   ALT 31  --   ALKPHOS 59  --   BILITOT 0.5  --   PROT 6.5  --   ALBUMIN 3.2* 3.2*   Coagulation Profile: Recent Labs  Lab 08/16/19 2125  INR 1.4*   CBG: Recent Labs  Lab 08/17/19 0440 08/17/19 0650  08/17/19 0740 08/17/19 1121 08/17/19 1624  GLUCAP 86 77 82 79 95   Urine analysis:    Component Value Date/Time   COLORURINE YELLOW 02/25/2017 1224   APPEARANCEUR HAZY (A) 02/25/2017 1224   LABSPEC 1.013 02/25/2017 1224   PHURINE 8.0 02/25/2017 1224   GLUCOSEU >=500 (A) 02/25/2017 1224   HGBUR SMALL (A) 02/25/2017 1224   BILIRUBINUR NEGATIVE 02/25/2017 1224   BILIRUBINUR neg 12/13/2014 1127   KETONESUR NEGATIVE 02/25/2017 1224   PROTEINUR >=300 (A) 02/25/2017 1224   UROBILINOGEN 0.2 05/01/2015 1252   NITRITE NEGATIVE 02/25/2017 1224   LEUKOCYTESUR NEGATIVE 02/25/2017 1224   Sepsis Labs:  Recent Results (from the past 240 hour(s))  Respiratory Panel by RT PCR (Flu A&B, Covid) - Nasopharyngeal Swab     Status: None   Collection Time: 08/16/19 11:20 PM   Specimen: Nasopharyngeal Swab  Result Value Ref Range Status   SARS Coronavirus 2 by RT PCR NEGATIVE NEGATIVE Final    Comment: (NOTE) SARS-CoV-2 target nucleic acids are NOT DETECTED. The SARS-CoV-2 RNA is generally detectable in upper respiratoy specimens during  the acute phase of infection. The lowest concentration of SARS-CoV-2 viral copies this assay can detect is 131 copies/mL. A negative result does not preclude SARS-Cov-2 infection and should not be used as the sole basis for treatment or other patient management decisions. A negative result may occur with  improper specimen collection/handling, submission of specimen other than nasopharyngeal swab, presence of viral mutation(s) within the areas targeted by this assay, and inadequate number of viral copies (<131 copies/mL). A negative result must be combined with clinical observations, patient history, and epidemiological information. The expected result is Negative. Fact Sheet for Patients:  PinkCheek.be Fact Sheet for Healthcare Providers:  GravelBags.it This test is not yet ap proved or cleared by the  [place] FDA and  has been authorized for detection and/or diagnosis of SARS-CoV-2 by FDA under an Emergency Use Authorization (EUA). This EUA will remain  in effect (meaning this test can be used) for the duration of the COVID-19 declaration under Section 564(b)(1) of the Act, 21 U.S.C. section 360bbb-3(b)(1), unless the authorization is terminated or revoked sooner.    Influenza A by PCR NEGATIVE NEGATIVE Final   Influenza B by PCR NEGATIVE NEGATIVE Final    Comment: (NOTE) The Xpert Xpress SARS-CoV-2/FLU/RSV assay is intended as an aid in  the diagnosis of influenza from Nasopharyngeal swab specimens and  should not be used as a sole basis for treatment. Nasal washings and  aspirates are unacceptable for Xpert Xpress SARS-CoV-2/FLU/RSV  testing. Fact Sheet for Patients: PinkCheek.be Fact Sheet for Healthcare Providers: GravelBags.it This test is not yet approved or cleared by the [place] FDA and  has been authorized for detection and/or diagnosis of SARS-CoV-2 by  FDA under an Emergency Use Authorization (EUA). This EUA will remain  in effect (meaning this test can be used) for the duration of the  Covid-19 declaration under Section 564(b)(1) of the Act, 21  U.S.C. section 360bbb-3(b)(1), unless the authorization is  terminated or revoked. Performed at [facility], [place] [address] [place], [place] [address]      Scheduled Meds: . amLODipine  10 mg Oral Daily  . Chlorhexidine Gluconate Cloth  6 each Topical Q0600  . [START ON 08/18/2019] Chlorhexidine Gluconate Cloth  6 each Topical Q0600  . darbepoetin (ARANESP) injection - NON-DIALYSIS  150 mcg Subcutaneous Q Tue-1800  . hydrALAZINE  25 mg Oral Q8H  . insulin aspart  0-6 Units Subcutaneous Q4H  . insulin glargine  12 Units Subcutaneous QHS  . labetalol  100 mg Oral BID  . metoCLOPramide (REGLAN) injection  5 mg Intravenous Once  . [START ON  08/18/2019] peg 3350 powder  0.5 kit Oral Once   Continuous Infusions: . sodium chloride    . sodium chloride       LOS: 1 day    Donnamae Jude, MD [facility] Pager [phone]  If 7PM-7AM, please contact night-coverage [email or web address] Password [facility] 08/17/2019, 5:54 PM

## 2019-08-18 ENCOUNTER — Inpatient Hospital Stay (HOSPITAL_COMMUNITY): Payer: Medicare Other | Admitting: Anesthesiology

## 2019-08-18 ENCOUNTER — Encounter (HOSPITAL_COMMUNITY): Payer: Self-pay | Admitting: Internal Medicine

## 2019-08-18 ENCOUNTER — Encounter (HOSPITAL_COMMUNITY)
Admission: EM | Disposition: A | Discharge status: Home Health | Payer: Self-pay | Source: Home / Self Care | Attending: Family Medicine

## 2019-08-18 DIAGNOSIS — K5520 Angiodysplasia of colon without hemorrhage: Secondary | ICD-10-CM

## 2019-08-18 DIAGNOSIS — K625 Hemorrhage of anus and rectum: Secondary | ICD-10-CM

## 2019-08-18 HISTORY — PX: ESOPHAGOGASTRODUODENOSCOPY (EGD) WITH PROPOFOL: SHX5813

## 2019-08-18 HISTORY — PX: HOT HEMOSTASIS: SHX5433

## 2019-08-18 HISTORY — PX: COLONOSCOPY WITH PROPOFOL: SHX5780

## 2019-08-18 LAB — RENAL FUNCTION PANEL
Albumin: 3.2 g/dL — ABNORMAL LOW (ref 3.5–5.0)
Anion gap: 18 — ABNORMAL HIGH (ref 5–15)
BUN: 40 mg/dL — ABNORMAL HIGH (ref 6–20)
CO2: 21 mmol/L — ABNORMAL LOW (ref 22–32)
Calcium: 8.8 mg/dL — ABNORMAL LOW (ref 8.9–10.3)
Chloride: 97 mmol/L — ABNORMAL LOW (ref 98–111)
Creatinine, Ser: 7.97 mg/dL — ABNORMAL HIGH (ref 0.44–1.00)
GFR calc Af Amer: 6 mL/min — ABNORMAL LOW (ref >60)
GFR calc non Af Amer: 5 mL/min — ABNORMAL LOW (ref >60)
Glucose, Bld: 160 mg/dL — ABNORMAL HIGH (ref 70–99)
Phosphorus: 7.1 mg/dL — ABNORMAL HIGH (ref 2.5–4.6)
Potassium: 4 mmol/L (ref 3.5–5.1)
Sodium: 136 mmol/L (ref 135–145)

## 2019-08-18 LAB — GLUCOSE, CAPILLARY
Glucose-Capillary: 100 mg/dL — ABNORMAL HIGH (ref 70–99)
Glucose-Capillary: 107 mg/dL — ABNORMAL HIGH (ref 70–99)
Glucose-Capillary: 109 mg/dL — ABNORMAL HIGH (ref 70–99)
Glucose-Capillary: 174 mg/dL — ABNORMAL HIGH (ref 70–99)
Glucose-Capillary: 182 mg/dL — ABNORMAL HIGH (ref 70–99)
Glucose-Capillary: 66 mg/dL — ABNORMAL LOW (ref 70–99)
Glucose-Capillary: 84 mg/dL (ref 70–99)
Glucose-Capillary: 92 mg/dL (ref 70–99)
Glucose-Capillary: 97 mg/dL (ref 70–99)

## 2019-08-18 LAB — CBC
HCT: 20.9 % — ABNORMAL LOW (ref 36.0–46.0)
Hemoglobin: 6.7 g/dL — CL (ref 12.0–15.0)
MCH: 31.5 pg (ref 26.0–34.0)
MCHC: 32.1 g/dL (ref 30.0–36.0)
MCV: 98.1 fL (ref 80.0–100.0)
Platelets: 89 10*3/uL — ABNORMAL LOW (ref 150–400)
RBC: 2.13 MIL/uL — ABNORMAL LOW (ref 3.87–5.11)
RDW: 19.8 % — ABNORMAL HIGH (ref 11.5–15.5)
WBC: 2.4 10*3/uL — ABNORMAL LOW (ref 4.0–10.5)
nRBC: 0 % (ref 0.0–0.2)

## 2019-08-18 LAB — HEMOGLOBIN AND HEMATOCRIT, BLOOD
HCT: 29.9 % — ABNORMAL LOW (ref 36.0–46.0)
Hemoglobin: 9.8 g/dL — ABNORMAL LOW (ref 12.0–15.0)

## 2019-08-18 LAB — PREPARE RBC (CROSSMATCH)

## 2019-08-18 SURGERY — COLONOSCOPY WITH PROPOFOL
Anesthesia: Monitor Anesthesia Care

## 2019-08-18 MED ORDER — ALPRAZOLAM 0.5 MG PO TABS: 1.0000 mg | ORAL_TABLET | Freq: Three times a day (TID) | ORAL | Status: DC | PRN

## 2019-08-18 MED ORDER — HYDROCORTISONE (PERIANAL) 2.5 % EX CREA
TOPICAL_CREAM | Freq: Two times a day (BID) | CUTANEOUS | Status: DC
  Filled 2019-08-18: qty 28.35

## 2019-08-18 MED ORDER — HYDRALAZINE HCL 25 MG PO TABS
25.0000 mg | ORAL_TABLET | Freq: Three times a day (TID) | ORAL | Status: DC
  Administered 2019-08-18 – 2019-08-19 (×2): 25 mg via ORAL
  Filled 2019-08-18 (×2): qty 1

## 2019-08-18 MED ORDER — LABETALOL HCL 5 MG/ML IV SOLN
INTRAVENOUS | Status: AC
  Filled 2019-08-18: qty 4

## 2019-08-18 MED ORDER — ALBUTEROL SULFATE (2.5 MG/3ML) 0.083% IN NEBU: 3.0000 mL | INHALATION_SOLUTION | RESPIRATORY_TRACT | Status: DC | PRN

## 2019-08-18 MED ORDER — PROPOFOL 500 MG/50ML IV EMUL
INTRAVENOUS | Status: DC | PRN
  Administered 2019-08-18: 100 ug/kg/min via INTRAVENOUS

## 2019-08-18 MED ORDER — SODIUM CHLORIDE 0.9% IV SOLUTION: Freq: Once | INTRAVENOUS | Status: AC

## 2019-08-18 MED ORDER — OXYCODONE HCL 5 MG PO TABS: 15.0000 mg | ORAL_TABLET | ORAL | Status: DC | PRN

## 2019-08-18 MED ORDER — SENNOSIDES-DOCUSATE SODIUM 8.6-50 MG PO TABS
2.0000 | ORAL_TABLET | Freq: Every day | ORAL | Status: DC
  Filled 2019-08-18: qty 2

## 2019-08-18 MED ORDER — PROPOFOL 10 MG/ML IV BOLUS
INTRAVENOUS | Status: DC | PRN
  Administered 2019-08-18: 20 mg via INTRAVENOUS

## 2019-08-18 MED ORDER — ALLOPURINOL 100 MG PO TABS
200.0000 mg | ORAL_TABLET | Freq: Every day | ORAL | Status: DC
  Administered 2019-08-18 – 2019-08-19 (×2): 200 mg via ORAL
  Filled 2019-08-18 (×2): qty 2

## 2019-08-18 MED ORDER — METHOCARBAMOL 500 MG PO TABS: 500.0000 mg | ORAL_TABLET | Freq: Every day | ORAL | Status: DC | PRN

## 2019-08-18 MED ORDER — DEXTROSE 50 % IV SOLN
25.0000 mL | Freq: Once | INTRAVENOUS | Status: AC
  Administered 2019-08-18: 25 mL via INTRAVENOUS

## 2019-08-18 MED ORDER — INSULIN ASPART 100 UNIT/ML ~~LOC~~ SOLN
0.0000 [IU] | Freq: Three times a day (TID) | SUBCUTANEOUS | Status: DC
  Administered 2019-08-19: 2 [IU] via SUBCUTANEOUS

## 2019-08-18 MED ORDER — FUROSEMIDE 80 MG PO TABS
80.0000 mg | ORAL_TABLET | Freq: Two times a day (BID) | ORAL | Status: DC
  Administered 2019-08-18 – 2019-08-19 (×2): 80 mg via ORAL
  Filled 2019-08-18 (×2): qty 1

## 2019-08-18 MED ORDER — DEXTROSE 50 % IV SOLN
INTRAVENOUS | Status: AC
  Filled 2019-08-18: qty 50

## 2019-08-18 MED ORDER — PANTOPRAZOLE SODIUM 40 MG PO TBEC
40.0000 mg | DELAYED_RELEASE_TABLET | Freq: Every day | ORAL | Status: DC
  Administered 2019-08-19: 40 mg via ORAL
  Filled 2019-08-18 (×2): qty 1

## 2019-08-18 MED ORDER — LABETALOL HCL 5 MG/ML IV SOLN
INTRAVENOUS | Status: DC | PRN
  Administered 2019-08-18: 5 mg via INTRAVENOUS

## 2019-08-18 MED ORDER — ATORVASTATIN CALCIUM 40 MG PO TABS
40.0000 mg | ORAL_TABLET | Freq: Every day | ORAL | Status: DC
  Administered 2019-08-18: 40 mg via ORAL
  Filled 2019-08-18: qty 1

## 2019-08-18 MED ORDER — AMLODIPINE BESYLATE 10 MG PO TABS
10.0000 mg | ORAL_TABLET | Freq: Every day | ORAL | Status: DC
  Administered 2019-08-19: 10 mg via ORAL
  Filled 2019-08-18: qty 1

## 2019-08-18 MED ORDER — LABETALOL HCL 5 MG/ML IV SOLN
10.0000 mg | Freq: Once | INTRAVENOUS | Status: AC
  Administered 2019-08-18: 10 mg via INTRAVENOUS

## 2019-08-18 MED ORDER — LABETALOL HCL 200 MG PO TABS
200.0000 mg | ORAL_TABLET | Freq: Two times a day (BID) | ORAL | Status: DC
  Administered 2019-08-18 – 2019-08-19 (×2): 200 mg via ORAL
  Filled 2019-08-18 (×2): qty 1

## 2019-08-18 MED ORDER — HYDROCORTISONE 2.5 % RE CREA: 1.0000 "application " | TOPICAL_CREAM | Freq: Two times a day (BID) | RECTAL | Status: DC

## 2019-08-18 MED ORDER — DULOXETINE HCL 20 MG PO CPEP
20.0000 mg | ORAL_CAPSULE | Freq: Every day | ORAL | Status: DC
  Administered 2019-08-18 – 2019-08-19 (×2): 20 mg via ORAL
  Filled 2019-08-18 (×2): qty 1

## 2019-08-18 SURGICAL SUPPLY — 25 items
BLOCK BITE 60FR ADLT L/F BLUE (MISCELLANEOUS) ×3 IMPLANT
ELECT REM PT RETURN 9FT ADLT (ELECTROSURGICAL)
ELECTRODE REM PT RTRN 9FT ADLT (ELECTROSURGICAL) IMPLANT
FCP BXJMBJMB 240X2.8X (CUTTING FORCEPS)
FLOOR PAD 36X40 (MISCELLANEOUS) ×3
FORCEP RJ3 GP 1.8X160 W-NEEDLE (CUTTING FORCEPS) IMPLANT
FORCEPS BIOP RAD 4 LRG CAP 4 (CUTTING FORCEPS) IMPLANT
FORCEPS BIOP RJ4 240 W/NDL (CUTTING FORCEPS)
FORCEPS BXJMBJMB 240X2.8X (CUTTING FORCEPS) IMPLANT
INJECTOR/SNARE I SNARE (MISCELLANEOUS) IMPLANT
LUBRICANT JELLY 4.5OZ STERILE (MISCELLANEOUS) IMPLANT
MANIFOLD NEPTUNE II (INSTRUMENTS) IMPLANT
NDL SCLEROTHERAPY 25GX240 (NEEDLE) IMPLANT
NEEDLE SCLEROTHERAPY 25GX240 (NEEDLE) IMPLANT
PAD FLOOR 36X40 (MISCELLANEOUS) ×1 IMPLANT
PROBE APC STR FIRE (PROBE) IMPLANT
PROBE INJECTION GOLD (MISCELLANEOUS)
PROBE INJECTION GOLD 7FR (MISCELLANEOUS) IMPLANT
SNARE ROTATE MED OVAL 20MM (MISCELLANEOUS) IMPLANT
SNARE SHORT THROW 13M SML OVAL (MISCELLANEOUS) IMPLANT
SYR 50ML LL SCALE MARK (SYRINGE) IMPLANT
TRAP SPECIMEN MUCOUS 40CC (MISCELLANEOUS) IMPLANT
TUBING ENDO SMARTCAP PENTAX (MISCELLANEOUS) ×6 IMPLANT
TUBING IRRIGATION ENDOGATOR (MISCELLANEOUS) ×3 IMPLANT
WATER STERILE IRR 1000ML POUR (IV SOLUTION) IMPLANT

## 2019-08-18 NOTE — Op Note (Signed)
[facility] Patient Name: [name] Procedure Date : 08/18/2019 MRN: [id number] Attending MD: Jerene Bears , MD Date of Birth: 1965/10/26 CSN: [id number] Age: 54 Admit Type: Inpatient Procedure:                Upper GI endoscopy Indications:              Acute post hemorrhagic anemia, Hematochezia Providers:                Lajuan Lines. Hilarie Fredrickson, MD, Josie Dixon, RN, Lazaro Arms, Technician Referring MD:             [facility] Medicines:                Monitored Anesthesia Care Complications:            No immediate complications. Estimated Blood Loss:     Estimated blood loss: none. Procedure:                Pre-Anesthesia Assessment:                           - Prior to the procedure, a History and Physical                            was performed, and patient medications and                            allergies were reviewed. The patient's tolerance of                            previous anesthesia was also reviewed. The risks                            and benefits of the procedure and the sedation                            options and risks were discussed with the patient.                            All questions were answered, and informed consent                            was obtained. Prior Anticoagulants: The patient has                            taken no previous anticoagulant or antiplatelet                            agents. ASA Grade Assessment: III - A patient with                            severe systemic disease. After reviewing the risks  and benefits, the patient was deemed in                            satisfactory condition to undergo the procedure.                           After obtaining informed consent, the endoscope was                            passed under direct vision. Throughout the                            procedure, the patient's blood pressure, pulse, and                 oxygen saturations were monitored continuously. The                            [id number]-H190 ([id number]) Olympus gastroscope was                            introduced through the mouth, and advanced to the                            second part of duodenum. The upper GI endoscopy was                            accomplished without difficulty. The patient                            tolerated the procedure well. Scope In: Scope Out: Findings:      The esophagus was normal.      The stomach was normal.      The examined duodenum was normal. Impression:               - Normal esophagus.                           - Normal stomach.                           - Normal examined duodenum.                           - No specimens collected. Moderate Sedation:      N/A Recommendation:           - Return patient to hospital ward for ongoing care.                           - Resume previous diet.                           - Continue present medications.                           - See the other procedure note for documentation of  additional recommendations. Procedure Code(s):        --- Professional ---                           [phone], Esophagogastroduodenoscopy, flexible,                            transoral; diagnostic, including collection of                            specimen(s) by brushing or washing, when performed                            (separate procedure) Diagnosis Code(s):        --- Professional ---                           D62, Acute posthemorrhagic anemia                           K92.1, Melena (includes Hematochezia) CPT copyright 2019 American Medical Association. All rights reserved. The codes documented in this report are preliminary and upon coder review may  be revised to meet current compliance requirements. Jerene Bears, MD 08/18/2019 12:02:28 PM This report has been signed electronically. Number of Addenda: 0

## 2019-08-18 NOTE — Progress Notes (Addendum)
[facility] Progress Note   Subjective: Seen prior to HD. No C/Os.   Objective Vitals:   08/18/19 1147 08/18/19 1157 08/18/19 1207 08/18/19 1243  BP: (!) 170/63 (!) 196/99 (!) 225/90 (!) 198/108  Pulse: 73 70 82 90  Resp: 18 16 (!) 22 16  Temp: (!) 97.5 F (36.4 C)     TempSrc: Temporal     SpO2: 97% 100% 99% 99%  Weight:      Height:       Physical Exam General: Chronically ill appearing female in NAD Heart: S1,S2 RRR no M/R/G Lungs:CTAB A/P Abdomen: Active BS Extremities: No LE edema Dialysis Access: L AVG + bruit   Additional Objective Labs: Basic Metabolic Panel: Recent Labs  Lab 08/16/19 2125 08/17/19 0813  NA 134* 136  K 7.3* 3.6  CL 95* 95*  CO2 17* 24  GLUCOSE 110* 76  BUN 122* 36*  CREATININE 14.90* 6.12*  CALCIUM 8.6* 8.4*  PHOS  --  4.1   Liver Function Tests: Recent Labs  Lab 08/16/19 2125 08/17/19 0813  AST 32  --   ALT 31  --   ALKPHOS 59  --   BILITOT 0.5  --   PROT 6.5  --   ALBUMIN 3.2* 3.2*   No results for input(s): LIPASE, AMYLASE in the last 168 hours. CBC: Recent Labs  Lab 08/16/19 2125 08/17/19 0813 08/18/19 0417  WBC 2.6* 2.5* 2.4*  NEUTROABS 1.6*  --   --   HGB 4.4* 7.1* 6.7*  HCT 14.4* 21.7* 20.9*  MCV 109.9* 95.2 98.1  PLT 94* 90* 89*   Blood Culture    Component Value Date/Time   SDES BLOOD RIGHT UPPER ARM 02/25/2017 1927   SPECREQUEST  02/25/2017 1927    BOTTLES DRAWN AEROBIC AND ANAEROBIC Blood Culture adequate volume   CULT NO GROWTH 5 DAYS 02/25/2017 1927   REPTSTATUS 03/02/2017 FINAL 02/25/2017 1927    Cardiac Enzymes: No results for input(s): CKTOTAL, CKMB, CKMBINDEX, TROPONINI in the last 168 hours. CBG: Recent Labs  Lab 08/18/19 0733 08/18/19 1005 08/18/19 1056 08/18/19 1154 08/18/19 1247  GLUCAP 84 66* 109* 107* 97   Iron Studies: No results for input(s): IRON, TIBC, TRANSFERRIN, FERRITIN in the last 72 hours. @lablastinr3 @ Studies/Results: No results  found. Medications: . sodium chloride    . sodium chloride     . amLODipine  10 mg Oral Daily  . Chlorhexidine Gluconate Cloth  6 each Topical Q0600  . darbepoetin (ARANESP) injection - NON-DIALYSIS  150 mcg Subcutaneous Q Tue-1800  . hydrALAZINE  25 mg Oral Q8H  . insulin aspart  0-6 Units Subcutaneous Q4H  . insulin glargine  12 Units Subcutaneous QHS  . labetalol  100 mg Oral BID     Dialysis: MWF G-O  3.5h  350/600  72kg  2/2 bath  P4  L AVG    - calcitriol 2.75ug  - mircera 225 q2wks last 12/21   - venofer 50/wk   Assessment/ Plan: 1. GIB/ ABL anemia - got 2u prbc yest and Hb 7.1. GI consulted. Went down for upper endo and colonoscopy. Upper endo unremarkable. Colonoscopy revealed a single medium-sized angioectasia mid ascending colon with another possible angioectasia (versus trauma) was ablated with same setting in the proximal ascending colon, both ablated. Friable mucosa transverse colon with diverticulosis sigmoid colon. Has rec'd 2 more units PRBCS today.  2. ESRD - on HD MWF on schedule.  3.Vol/ HTN - will restart her BP meds ( norvasc/ labet/ hydralazine),  BP's high, no bed wt's, stable on exam. Needs weights 4. Anemia ckd - due for esa, start 150 darbe weekly 5. MBD ckd - cont meds  Rita H. Brown NP-C 08/18/2019, 1:47 PM  [facility] [phone]  Pt seen, examined and agree w A/P as above.  Kelly Splinter  MD 08/18/2019, 3:33 PM

## 2019-08-18 NOTE — Anesthesia Preprocedure Evaluation (Signed)
Anesthesia Evaluation  Patient identified by MRN, date of birth, ID band Patient awake    Reviewed: Allergy & Precautions, H&P , NPO status , Patient's Chart, lab work & pertinent test results  Airway Mallampati: II   Neck ROM: full    Dental   Pulmonary Current Smoker,    breath sounds clear to auscultation       Cardiovascular hypertension, +CHF   Rhythm:regular Rate:Normal     Neuro/Psych  Headaches, Seizures -,  PSYCHIATRIC DISORDERS Anxiety Depression Bipolar Disorder Schizophrenia TIACVA    GI/Hepatic hiatal hernia, GERD  ,(+) Hepatitis -, C  Endo/Other  diabetes, Type 2  Renal/GU ESRFRenal disease     Musculoskeletal   Abdominal   Peds  Hematology  (+) Blood dyscrasia, anemia , JEHOVAH'S WITNESSOccasionally allows transfusions   Anesthesia Other Findings   Reproductive/Obstetrics                             Anesthesia Physical Anesthesia Plan  ASA: IV  Anesthesia Plan: MAC   Post-op Pain Management:    Induction:   PONV Risk Score and Plan: 1 and Propofol infusion and Treatment may vary due to age or medical condition  Airway Management Planned: Nasal Cannula  Additional Equipment:   Intra-op Plan:   Post-operative Plan:   Informed Consent: I have reviewed the patients History and Physical, chart, labs and discussed the procedure including the risks, benefits and alternatives for the proposed anesthesia with the patient or authorized representative who has indicated his/her understanding and acceptance.       Plan Discussed with: CRNA, Anesthesiologist and Surgeon  Anesthesia Plan Comments:         Anesthesia Quick Evaluation

## 2019-08-18 NOTE — Op Note (Signed)
[facility], [place] Patient Name: [name] Procedure Date : 08/18/2019 MRN: [id number] Attending MD: Jerene Bears , MD Date of Birth: 05-07-1966 CSN: [id number] Age: 54 Admit Type: Inpatient Procedure:                Colonoscopy Indications:              Hematochezia, history of occult GI bleeding with +                            tagged RBC study in Dec 2020 with coil embolization                            to the right colon Providers:                Lajuan Lines. Hilarie Fredrickson, MD, Josie Dixon, RN, Lazaro Arms, Technician Referring MD:             [facility] Medicines:                Monitored Anesthesia Care Complications:            No immediate complications. Estimated Blood Loss:     Estimated blood loss: none. Procedure:                Pre-Anesthesia Assessment:                           - Prior to the procedure, a History and Physical                            was performed, and patient medications and                            allergies were reviewed. The patient's tolerance of                            previous anesthesia was also reviewed. The risks                            and benefits of the procedure and the sedation                            options and risks were discussed with the patient.                            All questions were answered, and informed consent                            was obtained. Prior Anticoagulants: The patient has                            taken no previous anticoagulant or antiplatelet  agents. ASA Grade Assessment: III - A patient with                            severe systemic disease. After reviewing the risks                            and benefits, the patient was deemed in                            satisfactory condition to undergo the procedure.                           After obtaining informed consent, the colonoscope                            was passed  under direct vision. Throughout the                            procedure, the patient's blood pressure, pulse, and                            oxygen saturations were monitored continuously. The                            CF-HQ190L ([id number] Olympus colonoscope was                            introduced through the anus and advanced to the                            cecum, identified by appendiceal orifice and                            ileocecal valve. The colonoscopy was performed                            without difficulty. The patient tolerated the                            procedure well. The quality of the bowel                            preparation was good. The ileocecal valve,                            appendiceal orifice, and rectum were photographed. Scope In: 11:08:33 AM Scope Out: 11:39:07 AM Scope Withdrawal Time: 0 hours 19 minutes 10 seconds  Total Procedure Duration: 0 hours 30 minutes 34 seconds  Findings:      The digital rectal exam was normal.      There is no endoscopic evidence of active or recent bleeding in the       entire colon.      A single medium-sized angioectasia with typical arborization was found       in the mid ascending colon.  Fulguration to ablate the lesion to prevent       bleeding by argon plasma at 0.5 liters/minute and 20 watts was       successful. Another possible angioectasia (versus trauma) was ablated       with same setting in the proximal ascending colon with success.      The mucosa was friable with no active bleeding was found in the       transverse colon, in the ascending colon and in the cecum. This appeared       like colopathy with contact erythema and quick to occur suction trauma.       The mucosa had the appearance of mild melanosis particularly in the       right colon.      A few small-mouthed diverticula were found in the sigmoid colon.      The retroflexed view of the distal rectum and anal verge was normal and        showed no anal or rectal abnormalities. Impression:               - A single colonic angioectasia. Another possible                            angioectasia. Treated with argon plasma coagulation                            (APC).                           - Friability with no bleeding in the transverse                            colon, in the ascending colon and in the cecum.                           - Diverticulosis, non-bleeding, in the sigmoid                            colon.                           - The distal rectum and anal verge are normal on                            retroflexion view.                           - No specimens collected. Moderate Sedation:      N/A Recommendation:           - Return patient to hospital ward for ongoing care.                           - Resume previous diet.                           - Continue present medications.                           -  If recurrent hematochezia then STAT repeat tagged                            RBC nuclear study is recommended followed by IR                            consultation if positive. Procedure Code(s):        --- Professional ---                           [phone], Colonoscopy, flexible; with control of                            bleeding, any method Diagnosis Code(s):        --- Professional ---                           K55.20, Angiodysplasia of colon without hemorrhage                           K63.89, Other specified diseases of intestine                           K92.1, Melena (includes Hematochezia)                           K57.30, Diverticulosis of large intestine without                            perforation or abscess without bleeding CPT copyright 2019 American [employer] Association. All rights reserved. The codes documented in this report are preliminary and upon coder review may  be revised to meet current compliance requirements. Jerene Bears, MD 08/18/2019 12:15:31 PM This report has been signed  electronically. Number of Addenda: 0

## 2019-08-18 NOTE — Progress Notes (Signed)
CRITICAL VALUE ALERT  Critical Value: Hgb 6.7 g/dL  Date & Time Notified: 08/18/19 & 5:45am  Provider Notified:  NP Denny,M.  Orders Received/Actions taken: Yes/                                        transfuse 2 U RBC

## 2019-08-18 NOTE — Progress Notes (Signed)
PROGRESS NOTE    [name]  FXT:[id number] DOB: 1966/01/13 DOA: 08/16/2019 PCP: Rocco Serene, MD   Brief Narrative:  [name] a 54 y.o.femalewith medical history significant ofanemia, anxiety, bipolar disorder, schizoaffective disorder, depression, chronic combined systolic and diastolic congestive heart failure, chronic pain syndrome, depression, ESRD on HD TTS, GERD, hepatitis C, hyperlipidemia, hypertension, TIA, type 2 diabetes, history of GI bleed presenting to the ED for evaluation of rectal bleeding and missed hemodialysis.Patient reports 5-day history of rectal bleeding. States initially the blood was dark in color but has been bright red for the past few days. She is also having some periumbilical abdominal discomfort. She has not vomited blood. States she has had multiple hospitalizations for rectal bleeding but the cause of the bleeding has not been found. She missed dialysis all of last week due to her rectal bleeding. She is not on any blood thinners. Denies over-the-counter NSAID use. She has had lightheadedness, exertional dyspnea and substernal chest pain for the past few days. No chest pain at present. Last hospitalization, she had IR coil ablation to right colon.  ED Course:Blood pressure elevated. Not tachycardic. 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 WBC count 2.6, did have slightleukopenia on labs done a month ago as well. Hemoglobin 4.4, baseline in the 8-10 range. Platelet count 94,000, chronically low. Labs showing severe hyperkalemia with potassium 7.3. EKG with peaked T waves and QT prolongation. Bicarb 17. ED provider discussed this with the patient and she was willing to proceed with blood transfusions. 2 units PRBCs ordered. Received insulin/D50 and calcium gluconate for hyperkalemia. Nephrology was consulted and underwent urgent hemodialysis. SARS-CoV-2 test negative.  No further bleeding. GI  consulted and s/p colonoscopy and EGD. Area in colon treated with plasma laser.   Assessment & Plan:   Principal Problem:   GI bleed Active Problems:   Hyperkalemia   ESRD (end stage renal disease) (HCC)   Acute blood loss anemia   Type II diabetes mellitus with renal manifestations (HCC)   Angiodysplasia of colon  Suspected lower GI bleed acute symptomatic blood loss anemia Patient presented with complaints of bright red blood per rectum. No hematemesis. Concern for acute lower GI bleed. Hemoglobin 4.4, baseline in the 8-10 range. Work-upfor GI bleeddone in October 2020 was unrevealing. Patient had an EGD, flexible sigmoidoscopy, and capsule study done at that time. Admitted again in December 2020.She was found to have a positive RBC tagged scan and underwent angiogram and was found to have right colonic angiodysplasia. She had successful coil embolization done at that time.  08/18/19 Upper endo unremarkable. Colonoscopy revealed a single medium-sized angioectasia mid ascending colon with another possible angioectasia (versus trauma) was ablated with same setting in the proximal ascending colon, both ablated. Friable mucosa transverse colon with diverticulosis sigmoid colon.  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 At present, patient is willing to proceed with blood transfusions. -Posttransfusion H&H 9.7  Severe hyperkalemia Potassium 7.3. EKG with peaked T waves and QT prolongation. Secondary to missed hemodialysis treatments over the past week. She received temporizing measures including insulin/dextrose and calcium gluconate. -Nephrology consulted and currently undergoing urgent hemodialysis -Repeat labs and EKG after dialysis>>K+ down to 3.6 after dialysis--stable at 4.0  ESRD on HD Presenting with severe hyperkalemia as mentioned above. In addition, bicarb low at 17. Blood pressure elevated in the setting of  volume overload. -s/p urgent hemodialysis Ongoing MWF  Insulin-dependent diabetes mellitus -Very sensitive sliding scale insulin  -Continue home Lantus and Novolog  Hypertension - poorly controlled Increased labetalol to 200 mg bid Continue Norvasc 10 Continue Hydralazine  Depression On celexa at home, but prolongs QT-may inhibit platelet function and lead to bleeding?-switch to Cymbalta here  DVT prophylaxis: SCDs Code Status: Full Family Communication: Patient Disposition Plan: Home, likely tomorrow if stable   Consultants:   [facility]  Nephrology  Procedures:   HD  Antimicrobials:  Anti-infectives (From admission, onward)   None     Subjective: Feels ok after HD and EGD and colonoscopy today. No further GI bleeding.  Objective: Vitals:   08/18/19 1600 08/18/19 1630 08/18/19 1631 08/18/19 1740  BP: (!) 158/72 (!) 162/70 (!) 160/73 (!) 164/78  Pulse: 88 84 73 98  Resp:   14 18  Temp:   98.4 F (36.9 C) 98.5 F (36.9 C)  TempSrc:   Oral Oral  SpO2:   97% 100%  Weight:   72.4 kg   Height:        Intake/Output Summary (Last 24 hours) at 08/18/2019 1742 Last data filed at 08/18/2019 1631 Gross per 24 hour  Intake 1205 ml  Output 1812 ml  Net -607 ml   Filed Weights   08/18/19 0928 08/18/19 1345 08/18/19 1631  Weight: 79.8 kg 73.8 kg 72.4 kg    Examination:  General exam: Appears calm and comfortable, thin  Respiratory system: Clear to auscultation. Respiratory effort normal. Cardiovascular system: S1 & S2 heard, RRR. Gastrointestinal system: Abdomen is nondistended, soft and nontender.  Central nervous system: Alert and oriented. No focal neurological deficits. Extremities: Symmetric Skin: No rashes, lesions or ulcers Psychiatry: Judgement and insight appear normal. Mood & affect appropriate.   Data Reviewed: I have personally reviewed following labs and imaging studies  CBC: Recent Labs  Lab 08/16/19 2125 08/17/19 0813 08/18/19 0417  08/18/19 1411  WBC 2.6* 2.5* 2.4*  --   NEUTROABS 1.6*  --   --   --   HGB 4.4* 7.1* 6.7* 9.8*  HCT 14.4* 21.7* 20.9* 29.9*  MCV 109.9* 95.2 98.1  --   PLT 94* 90* 89*  --    Basic Metabolic Panel: Recent Labs  Lab 08/16/19 2125 08/17/19 0813 08/18/19 1407  NA 134* 136 136  K 7.3* 3.6 4.0  CL 95* 95* 97*  CO2 17* 24 21*  GLUCOSE 110* 76 160*  BUN 122* 36* 40*  CREATININE 14.90* 6.12* 7.97*  CALCIUM 8.6* 8.4* 8.8*  PHOS  --  4.1 7.1*   GFR: Estimated Creatinine Clearance: 8 mL/min (A) (by C-G formula based on SCr of 7.97 mg/dL (H)). Liver Function Tests: Recent Labs  Lab 08/16/19 2125 08/17/19 0813 08/18/19 1407  AST 32  --   --   ALT 31  --   --   ALKPHOS 59  --   --   BILITOT 0.5  --   --   PROT 6.5  --   --   ALBUMIN 3.2* 3.2* 3.2*   Coagulation Profile: Recent Labs  Lab 08/16/19 2125  INR 1.4*   CBG: Recent Labs  Lab 08/18/19 0733 08/18/19 1005 08/18/19 1056 08/18/19 1154 08/18/19 1247  GLUCAP 84 66* 109* 107* 97   Urine analysis:    Component Value Date/Time   COLORURINE YELLOW 02/25/2017 1224   APPEARANCEUR HAZY (A) 02/25/2017 1224   LABSPEC 1.013 02/25/2017 1224   PHURINE 8.0 02/25/2017 1224   GLUCOSEU >=500 (A) 02/25/2017 1224   HGBUR  SMALL (A) 02/25/2017 1224   BILIRUBINUR NEGATIVE 02/25/2017 1224   BILIRUBINUR neg 12/13/2014 1127   KETONESUR NEGATIVE 02/25/2017 1224   PROTEINUR >=300 (A) 02/25/2017 1224   UROBILINOGEN 0.2 05/01/2015 1252   NITRITE NEGATIVE 02/25/2017 1224   LEUKOCYTESUR NEGATIVE 02/25/2017 1224   Sepsis Labs:  Recent Results (from the past 240 hour(s))  Respiratory Panel by RT PCR (Flu A&B, Covid) - Nasopharyngeal Swab     Status: None   Collection Time: 08/16/19 11:20 PM   Specimen: Nasopharyngeal Swab  Result Value Ref Range Status   SARS Coronavirus 2 by RT PCR NEGATIVE NEGATIVE Final    Comment: (NOTE) SARS-CoV-2 target nucleic acids are NOT DETECTED. The SARS-CoV-2 RNA is generally detectable in upper  respiratoy specimens during the acute phase of infection. The lowest concentration of SARS-CoV-2 viral copies this assay can detect is 131 copies/mL. A negative result does not preclude SARS-Cov-2 infection and should not be used as the sole basis for treatment or other patient management decisions. A negative result may occur with  improper specimen collection/handling, submission of specimen other than nasopharyngeal swab, presence of viral mutation(s) within the areas targeted by this assay, and inadequate number of viral copies (<131 copies/mL). A negative result must be combined with clinical observations, patient history, and epidemiological information. The expected result is Negative. Fact Sheet for Patients:  PinkCheek.be Fact Sheet for Healthcare Providers:  GravelBags.it This test is not yet ap proved or cleared by the [place] FDA and  has been authorized for detection and/or diagnosis of SARS-CoV-2 by FDA under an Emergency Use Authorization (EUA). This EUA will remain  in effect (meaning this test can be used) for the duration of the COVID-19 declaration under Section 564(b)(1) of the Act, 21 U.S.C. section 360bbb-3(b)(1), unless the authorization is terminated or revoked sooner.    Influenza A by PCR NEGATIVE NEGATIVE Final   Influenza B by PCR NEGATIVE NEGATIVE Final    Comment: (NOTE) The Xpert Xpress SARS-CoV-2/FLU/RSV assay is intended as an aid in  the diagnosis of influenza from Nasopharyngeal swab specimens and  should not be used as a sole basis for treatment. Nasal washings and  aspirates are unacceptable for Xpert Xpress SARS-CoV-2/FLU/RSV  testing. Fact Sheet for Patients: PinkCheek.be Fact Sheet for Healthcare Providers: GravelBags.it This test is not yet approved or cleared by the [place] FDA and  has been authorized for  detection and/or diagnosis of SARS-CoV-2 by  FDA under an Emergency Use Authorization (EUA). This EUA will remain  in effect (meaning this test can be used) for the duration of the  Covid-19 declaration under Section 564(b)(1) of the Act, 21  U.S.C. section 360bbb-3(b)(1), unless the authorization is  terminated or revoked. Performed at [facility], [place] [address] [place], [place] [address]         Scheduled Meds: . amLODipine  10 mg Oral Daily  . Chlorhexidine Gluconate Cloth  6 each Topical Q0600  . darbepoetin (ARANESP) injection - NON-DIALYSIS  150 mcg Subcutaneous Q Tue-1800  . hydrALAZINE  25 mg Oral Q8H  . insulin aspart  0-6 Units Subcutaneous Q4H  . insulin glargine  12 Units Subcutaneous QHS  . labetalol  100 mg Oral BID   Continuous Infusions: . sodium chloride    . sodium chloride       LOS: 2 days   Donnamae Jude, MD [facility] Pager [phone]  If 7PM-7AM, please contact night-coverage [email or web address] Password [facility] 08/18/2019, 5:42 PM

## 2019-08-18 NOTE — Interval H&P Note (Signed)
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 Hemoglobin 6.7 this morning she received an additional unit of packed red cells. She is hypertensive in preprocedure unit this morning and has received IV labetalol.  Anesthesia at bedside preprocedure. The nature of the procedure, as well as the risks, benefits, and alternatives were carefully and thoroughly reviewed with the patient. Ample time for discussion and questions allowed. The patient understood, was satisfied, and agreed to proceed.    08/18/2019 10:03 AM  [name]  has presented today for surgery, with the diagnosis of Recurrent GI bleed and blood loss anemia..  The various methods of treatment have been discussed with the patient and family. After consideration of risks, benefits and other options for treatment, the patient has consented to  Procedure(s): COLONOSCOPY WITH PROPOFOL (N/A) ESOPHAGOGASTRODUODENOSCOPY (EGD) WITH PROPOFOL (N/A) as a surgical intervention.  The patient's history has been reviewed, patient examined, no change in status, stable for surgery.  I have reviewed the patient's chart and labs.  Questions were answered to the patient's satisfaction.     Lajuan Lines Marysue Fait

## 2019-08-18 NOTE — Transfer of Care (Signed)
Immediate Anesthesia Transfer of Care Note  Patient: [name]  Procedure(s) Performed: COLONOSCOPY WITH PROPOFOL (N/A ) ESOPHAGOGASTRODUODENOSCOPY (EGD) WITH PROPOFOL (N/A ) HOT HEMOSTASIS (ARGON PLASMA COAGULATION/BICAP) (N/A )  Patient Location: Endoscopy Unit  Anesthesia Type:MAC  Level of Consciousness: drowsy  Airway & Oxygen Therapy: Patient Spontanous Breathing and Patient connected to nasal cannula oxygen  Post-op Assessment: Report given to RN, Post -op Vital signs reviewed and stable and Patient moving all extremities X 4  Post vital signs: Reviewed and stable  Last Vitals:  Vitals Value Taken Time  BP 170/63 08/18/19 1147  Temp 36.4 C 08/18/19 1147  Pulse 87 08/18/19 1151  Resp 21 08/18/19 1151  SpO2 88 % 08/18/19 1151  Vitals shown include unvalidated device data.  Last Pain:  Vitals:   08/18/19 1147  TempSrc: Temporal  PainSc: Asleep      Patients Stated Pain Goal: 0 (24/40/10 2725)  Complications: No apparent anesthesia complications

## 2019-08-19 ENCOUNTER — Encounter: Payer: Self-pay | Admitting: *Deleted

## 2019-08-19 LAB — BPAM RBC
Blood Product Expiration Date: 202102082359
Blood Product Expiration Date: 202102092359
Blood Product Expiration Date: 202102102359
Blood Product Expiration Date: 202102102359
ISSUE DATE / TIME: 202101120203
ISSUE DATE / TIME: 202101120316
ISSUE DATE / TIME: 202101130629
ISSUE DATE / TIME: 202101131026
Unit Type and Rh: 5100
Unit Type and Rh: 5100
Unit Type and Rh: 5100
Unit Type and Rh: 5100

## 2019-08-19 LAB — TYPE AND SCREEN
ABO/RH(D): O POS
Antibody Screen: NEGATIVE
Unit division: 0
Unit division: 0
Unit division: 0
Unit division: 0

## 2019-08-19 LAB — BASIC METABOLIC PANEL
Anion gap: 15 (ref 5–15)
BUN: 24 mg/dL — ABNORMAL HIGH (ref 6–20)
CO2: 25 mmol/L (ref 22–32)
Calcium: 9.1 mg/dL (ref 8.9–10.3)
Chloride: 99 mmol/L (ref 98–111)
Creatinine, Ser: 5.49 mg/dL — ABNORMAL HIGH (ref 0.44–1.00)
GFR calc Af Amer: 10 mL/min — ABNORMAL LOW (ref >60)
GFR calc non Af Amer: 8 mL/min — ABNORMAL LOW (ref >60)
Glucose, Bld: 188 mg/dL — ABNORMAL HIGH (ref 70–99)
Potassium: 4.2 mmol/L (ref 3.5–5.1)
Sodium: 139 mmol/L (ref 135–145)

## 2019-08-19 LAB — GLUCOSE, CAPILLARY
Glucose-Capillary: 143 mg/dL — ABNORMAL HIGH (ref 70–99)
Glucose-Capillary: 148 mg/dL — ABNORMAL HIGH (ref 70–99)
Glucose-Capillary: 179 mg/dL — ABNORMAL HIGH (ref 70–99)
Glucose-Capillary: 98 mg/dL (ref 70–99)

## 2019-08-19 LAB — CBC
HCT: 32.3 % — ABNORMAL LOW (ref 36.0–46.0)
Hemoglobin: 10.3 g/dL — ABNORMAL LOW (ref 12.0–15.0)
MCH: 30.1 pg (ref 26.0–34.0)
MCHC: 31.9 g/dL (ref 30.0–36.0)
MCV: 94.4 fL (ref 80.0–100.0)
Platelets: 114 10*3/uL — ABNORMAL LOW (ref 150–400)
RBC: 3.42 MIL/uL — ABNORMAL LOW (ref 3.87–5.11)
RDW: 21.7 % — ABNORMAL HIGH (ref 11.5–15.5)
WBC: 3.9 10*3/uL — ABNORMAL LOW (ref 4.0–10.5)
nRBC: 0 % (ref 0.0–0.2)

## 2019-08-19 MED ORDER — PANTOPRAZOLE SODIUM 40 MG PO TBEC: 40.0000 mg | DELAYED_RELEASE_TABLET | Freq: Every day | ORAL | 0 refills | Status: DC

## 2019-08-19 MED ORDER — LABETALOL HCL 100 MG PO TABS: 200.0000 mg | ORAL_TABLET | Freq: Two times a day (BID) | ORAL | 0 refills | Status: DC

## 2019-08-19 MED ORDER — BUSPIRONE HCL 5 MG PO TABS: 5.0000 mg | ORAL_TABLET | Freq: Two times a day (BID) | ORAL | 0 refills | Status: AC | PRN

## 2019-08-19 MED ORDER — OXYMETAZOLINE HCL 0.05 % NA SOLN: 2.0000 | Freq: Two times a day (BID) | NASAL | 0 refills | Status: DC

## 2019-08-19 MED ORDER — BUSPIRONE HCL 5 MG PO TABS: 10.0000 mg | ORAL_TABLET | Freq: Two times a day (BID) | ORAL | Status: DC

## 2019-08-19 MED ORDER — BUSPIRONE HCL 5 MG PO TABS
5.0000 mg | ORAL_TABLET | Freq: Two times a day (BID) | ORAL | Status: DC
  Administered 2019-08-19: 5 mg via ORAL
  Filled 2019-08-19: qty 1

## 2019-08-19 MED ORDER — ALBUTEROL SULFATE (2.5 MG/3ML) 0.083% IN NEBU: 5.0000 mg | INHALATION_SOLUTION | Freq: Every day | RESPIRATORY_TRACT | 0 refills | Status: AC | PRN

## 2019-08-19 NOTE — Progress Notes (Signed)
DISCHARGE NOTE HOME [name] RIBAUDO to be discharged Home per MD order. Discussed prescriptions and follow up appointments with the patient. Prescriptions given to patient; medication list explained in detail. Patient verbalized understanding.  Skin clean, dry and intact without evidence of skin break down, no evidence of skin tears noted. IV catheter discontinued intact. Site without signs and symptoms of complications. Dressing and pressure applied. Pt denies pain at the site currently. No complaints noted.  Patient free of lines, drains, and wounds.   An After Visit Summary (AVS) was printed and given to the patient. Patient escorted via wheelchair, and discharged home via private auto.  Arlyss Repress, RN

## 2019-08-19 NOTE — Progress Notes (Signed)
[facility] Progress Note   Subjective: Seen prior to HD. Discussed her psych issues and pt asking for anxiety relief, some paranoia, no delusion or psychosis  Objective Vitals:   08/18/19 1740 08/18/19 2051 08/18/19 2209 08/19/19 0424  BP: (!) 164/78 (!) 165/70  (!) 156/69  Pulse: 98 97  83  Resp: 18 16  18   Temp: 98.5 F (36.9 C) (!) 100.7 F (38.2 C) 99.1 F (37.3 C) 99.6 F (37.6 C)  TempSrc: Oral Oral Oral Oral  SpO2: 100% 98%  96%  Weight:    73 kg  Height:       Physical Exam General: Chronically ill appearing female in NAD Heart: S1,S2 RRR no M/R/G Lungs:CTAB A/P Abdomen: Active BS Extremities: No LE edema Dialysis Access: L AVG + bruit   Additional Objective Labs: Basic Metabolic Panel: Recent Labs  Lab 08/17/19 0813 08/18/19 1407 08/19/19 0848  NA 136 136 139  K 3.6 4.0 4.2  CL 95* 97* 99  CO2 24 21* 25  GLUCOSE 76 160* 188*  BUN 36* 40* 24*  CREATININE 6.12* 7.97* 5.49*  CALCIUM 8.4* 8.8* 9.1  PHOS 4.1 7.1*  --    Liver Function Tests: Recent Labs  Lab 08/16/19 2125 08/17/19 0813 08/18/19 1407  AST 32  --   --   ALT 31  --   --   ALKPHOS 59  --   --   BILITOT 0.5  --   --   PROT 6.5  --   --   ALBUMIN 3.2* 3.2* 3.2*   No results for input(s): LIPASE, AMYLASE in the last 168 hours. CBC: Recent Labs  Lab 08/16/19 2125 08/16/19 2125 08/17/19 0813 08/17/19 0813 08/18/19 0417 08/18/19 1411 08/19/19 0848  WBC 2.6*   < > 2.5*  --  2.4*  --  3.9*  NEUTROABS 1.6*  --   --   --   --   --   --   HGB 4.4*   < > 7.1*   < > 6.7* 9.8* 10.3*  HCT 14.4*   < > 21.7*   < > 20.9* 29.9* 32.3*  MCV 109.9*  --  95.2  --  98.1  --  94.4  PLT 94*   < > 90*  --  89*  --  114*   < > = values in this interval not displayed.   Blood Culture    Component Value Date/Time   SDES BLOOD RIGHT UPPER ARM 02/25/2017 1927   SPECREQUEST  02/25/2017 1927    BOTTLES DRAWN AEROBIC AND ANAEROBIC Blood Culture adequate volume   CULT NO GROWTH 5  DAYS 02/25/2017 1927   REPTSTATUS 03/02/2017 FINAL 02/25/2017 1927    Cardiac Enzymes: No results for input(s): CKTOTAL, CKMB, CKMBINDEX, TROPONINI in the last 168 hours. CBG: Recent Labs  Lab 08/18/19 1737 08/18/19 1958 08/18/19 2358 08/19/19 0425 08/19/19 0730  GLUCAP 174* 182* 148* 143* 179*   Iron Studies: No results for input(s): IRON, TIBC, TRANSFERRIN, FERRITIN in the last 72 hours. @lablastinr3 @ Studies/Results: No results found. Medications: . sodium chloride    . sodium chloride     . allopurinol  200 mg Oral Daily  . amLODipine  10 mg Oral Daily  . atorvastatin  40 mg Oral QHS  . busPIRone  10 mg Oral BID  . Chlorhexidine Gluconate Cloth  6 each Topical Q0600  . darbepoetin (ARANESP) injection - NON-DIALYSIS  150 mcg Subcutaneous Q Tue-1800  . furosemide  80 mg Oral BID  .  hydrALAZINE  25 mg Oral TID  . hydrocortisone   Rectal BID  . insulin aspart  0-9 Units Subcutaneous TID WC  . labetalol  200 mg Oral BID  . pantoprazole  40 mg Oral Q0600  . senna-docusate  2 tablet Oral QHS     Dialysis: MWF G-O  3.5h  350/600  72kg  2/2 bath  P4  L AVG    - calcitriol 2.75ug  - mircera 225 q2wks last 12/21   - venofer 50/wk   Assessment/ Plan: 1. GIB/ ABL anemia - got 2u prbc yest and Hb 7.1. SP prbc's Hb is 9. Upper endo neg. Colonoscopy revealed a single medium-sized angioectasia mid ascending colon with another possible angioectasia (versus trauma) in the proximal ascending colon, both ablated. Friable mucosa transverse colon with diverticulosis sigmoid colon. Going home today.  2. ESRD - on HD MWF on schedule.  3.Vol/ HTN - cont her home BP meds ( norvasc/ labet/ hydralazine) 4. Anemia ckd - due for esa, start 150 darbe weekly 5. MBD ckd - cont meds 6. Anxiety +/- depression: would like to avoid SSRI/ SNRI due to antiplatelet effects w/ her recurrent GIB. Discussed w/ pt at length, will focus on anxiety w/ Buspar for now 5 bid (esrd start dose) to be titrated  up slowly as needed and dc cymbalta. Home Celexa was already dc'd. Pt agreed to these changes after discussion this am.     Kelly Splinter  MD 08/19/2019, 10:58 AM

## 2019-08-19 NOTE — TOC Transition Note (Signed)
Transition of Care [facility]) - CM/SW Discharge Note   Patient Details  Name: [name] MRN: [id number] Date of Birth: 1966-07-15  Transition of Care [facility]) CM/SW Contact:  Bartholomew Crews, RN Phone Number: [phone] 08/19/2019, 12:37 PM   Clinical Narrative:    Spoke with patient at the bedside. Patient is requesting HH PT - PT eval recommended HH. Referral placed with [facility]. Patient requesting transportation home - waiver signed. Patient asking about mental health services - referred PCP, patient states that they are making her a follow up appointment. Pharmacy is [facility] on [address] - she states that she thinks they deliver. [employer] for transportation to dialysis. No further TOC needs identified.    Final next level of care: [place] Barriers to Discharge: No Barriers Identified   Patient Goals and CMS Choice Patient states their goals for this hospitalization and ongoing recovery are:: return home CMS Medicare.gov Compare Post Acute Care list provided to:: Patient Choice offered to / list presented to : Patient  Discharge Placement                       Discharge Plan and Services                DME Arranged: N/A DME Agency: NA       HH Arranged: PT [place] Agency: [place] ([facility]) Date [place]: 08/19/19 Time [place]: 1237 Representative spoke with at [place]: [place] ([place]) Interventions     Readmission Risk Interventions Readmission Risk Prevention Plan 02/26/2019 01/21/2019 11/17/2018  Transportation Screening Complete Complete -  Medication Review Press photographer) Complete Complete -  PCP or Specialist appointment within 3-5 days of discharge Complete Complete -  [place] or Home Care Consult Complete Complete -  SW Recovery Care/Counseling Consult Complete - Complete  Palliative Care Screening Not Applicable Not Applicable -  [place] Not  Applicable Not Applicable -  Some recent data might be hidden

## 2019-08-19 NOTE — Evaluation (Signed)
Physical Therapy Evaluation Patient Details Name: [name] MRN: [id number] DOB: 1965/08/16 Today's Date: 08/19/2019   History of Present Illness  [name] is a 54 y.o. female with medical history significant of anemia, anxiety, bipolar disorder, schizoaffective disorder, depression, chronic combined systolic and diastolic congestive heart failure, chronic pain syndrome, depression, ESRD on HD TTS, GERD, hepatitis C, hyperlipidemia, hypertension, TIA, type 2 diabetes, history of GI bleed presenting to the ED for evaluation of rectal bleeding and missed hemodialysis. GI consulted and s/p colonoscopy and EGD. Area in colon treated with plasma laser.  Clinical Impression  Pt admitted with above. Pt presents with decreased functional mobility secondary to balance impairments, gait abnormalities and decreased proximal strength. Pt ambulating hallway distances with a walker without physical assist. Also negotiated 4 steps with railings to simulate home set up. Would benefit from HHPT to address deficits and maximize functional independence.     Follow Up Recommendations Home health PT    Equipment Recommendations  None recommended by PT    Recommendations for Other Services       Precautions / Restrictions Precautions Precautions: Fall Restrictions Weight Bearing Restrictions: No      Mobility  Bed Mobility               General bed mobility comments: OOB standing in room upon arrival  Transfers Overall transfer level: Modified independent                  Ambulation/Gait Ambulation/Gait assistance: Modified independent (Device/Increase time) Gait Distance (Feet): 400 Feet Assistive device: Rolling walker (2 wheeled) Gait Pattern/deviations: Step-through pattern;Decreased step length - left;Trendelenburg        Stairs Stairs: Yes Stairs assistance: Supervision Stair Management: Two rails Number of Stairs: 4 General stair comments: Cues for sequencing,  step by step pattern. Pt ascending forwards step over step; descending sideways with step by step pattern  Wheelchair Mobility    Modified Rankin (Stroke Patients Only)       Balance Overall balance assessment: Needs assistance         Standing balance support: No upper extremity supported;During functional activity Standing balance-Leahy Scale: Fair                               Pertinent Vitals/Pain Pain Assessment: No/denies pain    Home Living Family/patient expects to be discharged to:: Private residence Living Arrangements: Alone Available Help at Discharge: Family;Available PRN/intermittently Type of Home: Apartment Home Access: Stairs to enter Entrance Stairs-Rails: Right;Left Entrance Stairs-Number of Steps: 4   Home Equipment: Walker - 4 wheels;Walker - 2 wheels;Cane - single point      Prior Function Level of Independence: Independent with assistive device(s)         Comments: Uses cane vs Rollator     Hand Dominance        Extremity/Trunk Assessment   Upper Extremity Assessment Upper Extremity Assessment: Overall WFL for tasks assessed    Lower Extremity Assessment Lower Extremity Assessment: Generalized weakness       Communication   Communication: No difficulties  Cognition Arousal/Alertness: Awake/alert Behavior During Therapy: WFL for tasks assessed/performed Overall Cognitive Status: Within Functional Limits for tasks assessed                                 General Comments: WFL for basic mobility, psych hx  General Comments      Exercises     Assessment/Plan    PT Assessment Patient needs continued PT services  PT Problem List Decreased strength;Decreased balance;Decreased mobility       PT Treatment Interventions DME instruction;Gait training;Stair training;Functional mobility training;Therapeutic activities;Therapeutic exercise;Balance training;Patient/family education    PT Goals  (Current goals can be found in the Care Plan section)  Acute Rehab PT Goals Patient Stated Goal: "strengthen my core so I can pick my grandkids up." PT Goal Formulation: With patient Time For Goal Achievement: 09/02/19 Potential to Achieve Goals: Good    Frequency Min 3X/week   Barriers to discharge        Co-evaluation               AM-PAC PT "6 Clicks" Mobility  Outcome Measure Help needed turning from your back to your side while in a flat bed without using bedrails?: None Help needed moving from lying on your back to sitting on the side of a flat bed without using bedrails?: None Help needed moving to and from a bed to a chair (including a wheelchair)?: None Help needed standing up from a chair using your arms (e.g., wheelchair or bedside chair)?: None Help needed to walk in hospital room?: None Help needed climbing 3-5 steps with a railing? : None 6 Click Score: 24    End of Session Equipment Utilized During Treatment: Gait belt Activity Tolerance: Patient tolerated treatment well Patient left: Other (comment)(standing in room) Nurse Communication: Mobility status PT Visit Diagnosis: Unsteadiness on feet (R26.81);Other abnormalities of gait and mobility (R26.89);Muscle weakness (generalized) (M62.81);Difficulty in walking, not elsewhere classified (R26.2)    Time: 1552-0802 PT Time Calculation (min) (ACUTE ONLY): 13 min   Charges:   PT Evaluation $PT Eval Moderate Complexity: 1 Mod          Ellamae Sia, [place], DPT Acute Rehabilitation Services Pager [phone] Office [phone]   Willy Eddy 08/19/2019, 1:04 PM

## 2019-08-19 NOTE — Discharge Summary (Addendum)
Physician Discharge Summary  [name] VXB:[id number] DOB: 18-Jan-1966 DOA: 08/16/2019  PCP: Rocco Serene, MD  Admit date: 08/16/2019 Discharge date: 08/19/2019  Admitted From: home Disposition:  home  Recommendations for Outpatient Follow-up:  1. Follow up with PCP in 1-2 weeks 2. Please obtain BMP/CBC in one week 3. Please follow up on the following pending results:  Home Health: HHPT  Equipment/Devices: none  Discharge Condition: Stable Code Status: full Diet recommendation: Heart Healthy, diabetic   Brief/Interim Summary:  54 y.o.femalewith medical history significant ofanemia, anxiety, bipolar disorder, schizoaffective disorder, depression, chronic combined systolic and diastolic congestive heart failure, chronic pain syndrome, depression, ESRD on HD TTS, GERD, hepatitis C, hyperlipidemia, hypertension, TIA, type 2 diabetes, history of GI bleed presenting to the ED for evaluation of rectal bleeding and missed hemodialysis.Patient reports 5-day history of rectal bleeding. States initially the blood was dark in color but has been bright red for the past few days. She is also having some periumbilical abdominal discomfort. She has not vomited blood. States she has had multiple hospitalizations for rectal bleeding but the cause of the bleeding has not been found. She missed dialysis all of last week due to her rectal bleeding. She is not on any blood thinners. Denies over-the-counter NSAID use. She has had lightheadedness, exertional dyspnea and substernal chest pain for the past few days. No chest pain at present.Last hospitalization, she had IR coil ablation to right colon.  In [place] pressure elevated. Not tachycardic. 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 WBC count 2.6, did have slightleukopenia on labs done a month ago as well. Hemoglobin 4.4, baseline in the 8-10 range. Platelet count 94,000, chronically low. Labs  showing severe hyperkalemia with potassium 7.3. EKG with peaked T waves and QT prolongation. Bicarb 17. ED provider discussed this with the patient and she was willing to proceed with blood transfusions. 2 units PRBCs ordered. Received insulin/D50 and calcium gluconate for hyperkalemia. Nephrology was consulted andunderwenturgent hemodialysis. SARS-CoV-2 test negative. Patient was managed for lower GI bleed hyperkalemia, missed dialysis.No further bleeding. GI consulted and s/p colonoscopy and EGD. Area in colon treated with plasma laser. At this time patient medically stable.  Hemoglobin is improved in 10 g.  Shortness of breath able to ambulate with PT and will be going home with home health PT. She will go with outpatient dialysis as scheduled.  Discharge Diagnoses:  Principal Problem: GI bleed: Status post endoscopy and EGD.  See above.  No more recurrence.  If she has recurrent bleeding patient is aware about need to seek immediate medical attention and may need  bleeding scan/IR embolization. 08/18/19 Upper endo unremarkable. Colonoscopy revealed asingle medium-sized angioectasiamid ascending colonwith another possibleangioectasia (versus trauma) was ablated with same setting in the proximalascending colon, both ablated. Friable mucosa transverse colon with diverticulosis sigmoid colon.   Hyperkalemia: Resolved continue scheduled dialysis ESRD Acute blood loss anemia from GI bleed: Patient is Jehovah's Witness but agreeable for blood transfusion, received 4 units PRBC hemoglobin currently stable in 10 g Type II diabetes mellitus with renal manifestations: cont Home insulin Angiodysplasia of colon Hypertension poorly controlled continue Norvasc hydralazine and increased dose of labetalol. Anxiety/Depression:On celexa at home, but prolongs QT-may inhibit platelet function and lead to bleeding?-Discontinue further switch to BuSpar    Consults:  Nephrology/GI  Subjective: Ambulating in the room no nausea vomiting fever chills.  No recurrence of bleeding.  Tolerating diet. Hemo-globin stable. Wants to go home. Discharge Exam: Vitals:  08/18/19 2209 08/19/19 0424  BP:  (!) 156/69  Pulse:  83  Resp:  18  Temp: 99.1 F (37.3 C) 99.6 F (37.6 C)  SpO2:  96%   General: Pt is alert, awake, not in acute distress Cardiovascular: RRR, S1/S2 +, no rubs, no gallops Respiratory: CTA bilaterally, no wheezing, no rhonchi Abdominal: Soft, NT, ND, bowel sounds + Extremities: no edema, no cyanosis  Discharge Instructions  Discharge Instructions    Diet - low sodium heart healthy   Complete by: As directed    Discharge instructions   Complete by: As directed    Please call call MD or return to ER for similar or worsening recurring problem that brought you to hospital or if any fever,nausea/vomiting,abdominal pain, uncontrolled pain, chest pain,  shortness of breath or any other alarming symptoms.  Please follow-up your doctor as instructed in a week time and call the office for appointment.  Please avoid alcohol, smoking, or any other illicit substance and maintain healthy habits including taking your regular medications as prescribed.  You were cared for by a hospitalist during your hospital stay. If you have any questions about your discharge medications or the care you received while you were in the hospital after you are discharged, you can call the unit and ask to speak with the hospitalist on call if the hospitalist that took care of you is not available.  Once you are discharged, your primary care physician will handle any further medical issues. Please note that NO REFILLS for any discharge medications will be authorized once you are discharged, as it is imperative that you return to your primary care physician (or establish a relationship with a primary care physician if you do not have one) for your aftercare  needs so that they can reassess your need for medications and monitor your lab values   Increase activity slowly   Complete by: As directed      Allergies as of 08/19/2019      Reactions   Penicillins Hives, Other (See Comments)   Has patient had a PCN reaction causing immediate rash, facial/tongue/throat swelling, SOB or lightheadedness with hypotension: Yes Has patient had a PCN reaction causing severe rash involving mucus membranes or skin necrosis: No Has patient had a PCN reaction that required hospitalization No Has patient had a PCN reaction occurring within the last 10 years: No If all of the above answers are "NO", then may proceed with Cephalosporin use. Pt tolerates cefepime and ceftriaxone   Gabapentin Itching, Swelling   Lyrica [pregabalin] Swelling, Other (See Comments)   Facial and leg swelling   Saphris [asenapine] Swelling, Other (See Comments)   Facial swelling   Tramadol Swelling, Other (See Comments)   Leg swelling   Adhesive [tape] Itching   SEVERE ITCHING   Latex Itching   SEVERE ITCHING   Other Other (See Comments)   NO BLOOD PRODUCTS; PATIENT IS A JEHOVAH'S WITNESS      Medication List    STOP taking these medications   ALPRAZolam 1 MG tablet Commonly known as: XANAX   citalopram 20 MG tablet Commonly known as: CELEXA     TAKE these medications   acetaminophen 325 MG tablet Commonly known as: TYLENOL Take 2 tablets (650 mg total) by mouth every 6 (six) hours as needed for fever, headache or moderate pain.   albuterol 108 (90 Base) MCG/ACT inhaler Commonly known as: VENTOLIN HFA Inhale 2 puffs into the lungs every 4 (four) hours as needed for wheezing  or shortness of breath.   albuterol (2.5 MG/3ML) 0.083% nebulizer solution Commonly known as: PROVENTIL Take 6 mLs (5 mg total) by nebulization daily as needed for wheezing or shortness of breath.   allopurinol 100 MG tablet Commonly known as: ZYLOPRIM Take 2 tablets (200 mg total) by mouth  daily.   amLODipine 10 MG tablet Commonly known as: NORVASC Take 1 tablet (10 mg total) by mouth daily.   atorvastatin 40 MG tablet Commonly known as: LIPITOR Take 1 tablet (40 mg total) by mouth at bedtime.   busPIRone 5 MG tablet Commonly known as: BUSPAR Take 1 tablet (5 mg total) by mouth 2 (two) times daily as needed for up to 7 days.   Darbepoetin Alfa 200 MCG/0.4ML Sosy injection Commonly known as: ARANESP Inject 0.4 mLs (200 mcg total) into the vein every Wednesday with hemodialysis.   diphenhydrAMINE 25 mg capsule Commonly known as: BENADRYL Take 1 capsule (25 mg total) by mouth every 6 (six) hours as needed for up to 8 days for itching.   furosemide 80 MG tablet Commonly known as: LASIX Take 80 mg by mouth 2 (two) times daily.   hydrALAZINE 25 MG tablet Commonly known as: APRESOLINE Take 25 mg by mouth 3 (three) times daily.   hydrocortisone 2.5 % rectal cream Commonly known as: ANUSOL-HC Place 1 application rectally 2 (two) times daily.   hydrOXYzine 25 MG tablet Commonly known as: ATARAX/VISTARIL Take 1 tablet (25 mg total) by mouth 3 (three) times daily as needed for itching or anxiety.   insulin aspart 100 UNIT/ML injection Commonly known as: novoLOG 0-9 Units, Subcutaneous, 3 times daily with meals CBG < 70: implement hypoglycemia protocol CBG 70 - 120: 0 units CBG 121 - 150: 1 unit CBG 151 - 200: 2 units CBG 201 - 250: 3 units CBG 251 - 300: 5 units CBG 301 - 350: 7 units CBG 351 - 400: 9 units CBG > 400: call MD What changed:   how much to take  how to take this  when to take this   insulin glargine 100 UNIT/ML injection Commonly known as: LANTUS Inject 24 Units into the skin at bedtime.   labetalol 100 MG tablet Commonly known as: NORMODYNE Take 2 tablets (200 mg total) by mouth 2 (two) times daily. What changed: how much to take   lidocaine-prilocaine cream Commonly known as: EMLA Apply 1 application topically See admin instructions. On  dialysis days   methocarbamol 500 MG tablet Commonly known as: ROBAXIN Take 500 mg by mouth daily as needed for muscle spasms.   mupirocin ointment 2 % Commonly known as: BACTROBAN Apply 1 application topically as needed (on arm).   oxyCODONE 15 MG immediate release tablet Commonly known as: ROXICODONE Take 15 mg by mouth every 4 (four) hours as needed for pain.   oxymetazoline 0.05 % nasal spray Commonly known as: AFRIN Place 2 sprays into both nostrils 2 (two) times daily.   pantoprazole 40 MG tablet Commonly known as: PROTONIX Take 1 tablet (40 mg total) by mouth daily at 6 (six) AM.   SARNA ULTRA EX Apply 1 application topically daily as needed (itch).   senna-docusate 8.6-50 MG tablet Commonly known as: Senokot-S Take 2 tablets by mouth at bedtime.      Follow-up Information    Rocco Serene, MD Follow up in 1 week(s).   Specialty: Internal Medicine Contact information: [address]  [address] [address], [place]  Follow up.   Specialty: [place] Why: The office will call to schedule physical therapy visits         Allergies  Allergen Reactions  . Penicillins Hives and Other (See Comments)    Has patient had a PCN reaction causing immediate rash, facial/tongue/throat swelling, SOB or lightheadedness with hypotension: Yes Has patient had a PCN reaction causing severe rash involving mucus membranes or skin necrosis: No Has patient had a PCN reaction that required hospitalization No Has patient had a PCN reaction occurring within the last 10 years: No If all of the above answers are "NO", then may proceed with Cephalosporin use. Pt tolerates cefepime and ceftriaxone  . Gabapentin Itching and Swelling  . Lyrica [Pregabalin] Swelling and Other (See Comments)    Facial and leg swelling  . Saphris [Asenapine] Swelling and Other (See Comments)    Facial swelling  . Tramadol Swelling and Other (See  Comments)    Leg swelling  . Adhesive [Tape] Itching    SEVERE ITCHING  . Latex Itching    SEVERE ITCHING  . Other Other (See Comments)    NO BLOOD PRODUCTS; PATIENT IS A JEHOVAH'S WITNESS    The results of significant diagnostics from this hospitalization (including imaging, microbiology, ancillary and laboratory) are listed below for reference.    Microbiology: Recent Results (from the past 240 hour(s))  Respiratory Panel by RT PCR (Flu A&B, Covid) - Nasopharyngeal Swab     Status: None   Collection Time: 08/16/19 11:20 PM   Specimen: Nasopharyngeal Swab  Result Value Ref Range Status   SARS Coronavirus 2 by RT PCR NEGATIVE NEGATIVE Final    Comment: (NOTE) SARS-CoV-2 target nucleic acids are NOT DETECTED. The SARS-CoV-2 RNA is generally detectable in upper respiratoy specimens during the acute phase of infection. The lowest concentration of SARS-CoV-2 viral copies this assay can detect is 131 copies/mL. A negative result does not preclude SARS-Cov-2 infection and should not be used as the sole basis for treatment or other patient management decisions. A negative result may occur with  improper specimen collection/handling, submission of specimen other than nasopharyngeal swab, presence of viral mutation(s) within the areas targeted by this assay, and inadequate number of viral copies (<131 copies/mL). A negative result must be combined with clinical observations, patient history, and epidemiological information. The expected result is Negative. Fact Sheet for Patients:  PinkCheek.be Fact Sheet for Healthcare Providers:  GravelBags.it This test is not yet ap proved or cleared by the [place] FDA and  has been authorized for detection and/or diagnosis of SARS-CoV-2 by FDA under an Emergency Use Authorization (EUA). This EUA will remain  in effect (meaning this test can be used) for the duration of the COVID-19  declaration under Section 564(b)(1) of the Act, 21 U.S.C. section 360bbb-3(b)(1), unless the authorization is terminated or revoked sooner.    Influenza A by PCR NEGATIVE NEGATIVE Final   Influenza B by PCR NEGATIVE NEGATIVE Final    Comment: (NOTE) The Xpert Xpress SARS-CoV-2/FLU/RSV assay is intended as an aid in  the diagnosis of influenza from Nasopharyngeal swab specimens and  should not be used as a sole basis for treatment. Nasal washings and  aspirates are unacceptable for Xpert Xpress SARS-CoV-2/FLU/RSV  testing. Fact Sheet for Patients: PinkCheek.be Fact Sheet for Healthcare Providers: GravelBags.it This test is not yet approved or cleared by the [place] FDA and  has been authorized for detection and/or diagnosis of SARS-CoV-2 by  FDA under an Emergency Use Authorization (  EUA). This EUA will remain  in effect (meaning this test can be used) for the duration of the  Covid-19 declaration under Section 564(b)(1) of the Act, 21  U.S.C. section 360bbb-3(b)(1), unless the authorization is  terminated or revoked. Performed at [facility], [place] [address], [place], [place] [address]     Procedures/Studies:  Colonoscopy  A single colonic angioectasia. Another possible angioectasia. Treated with argon plasma coagulation (APC). - Friability with no bleeding in the transverse colon, in the ascending colon and in the cecum. - Diverticulosis, non-bleeding, in the sigmoid colon. - The distal rectum and anal verge are normal on retroflexion view. - No specimens collected. Impression: N/A Moderate Sedation: - Return patient to hospital ward for ongoing care. - Resume previous diet. - Continue present medications. - If recurrent hematochezia then STAT repeat tagged RBC nuclear study is recommended followed by IR consultation if positive.   Labs: BNP (last 3 results) No results for input(s): BNP in the last  8760 hours. Basic Metabolic Panel: Recent Labs  Lab 08/16/19 2125 08/17/19 0813 08/18/19 1407 08/19/19 0848  NA 134* 136 136 139  K 7.3* 3.6 4.0 4.2  CL 95* 95* 97* 99  CO2 17* 24 21* 25  GLUCOSE 110* 76 160* 188*  BUN 122* 36* 40* 24*  CREATININE 14.90* 6.12* 7.97* 5.49*  CALCIUM 8.6* 8.4* 8.8* 9.1  PHOS  --  4.1 7.1*  --    Liver Function Tests: Recent Labs  Lab 08/16/19 2125 08/17/19 0813 08/18/19 1407  AST 32  --   --   ALT 31  --   --   ALKPHOS 59  --   --   BILITOT 0.5  --   --   PROT 6.5  --   --   ALBUMIN 3.2* 3.2* 3.2*   No results for input(s): LIPASE, AMYLASE in the last 168 hours. No results for input(s): AMMONIA in the last 168 hours. CBC: Recent Labs  Lab 08/16/19 2125 08/17/19 0813 08/18/19 0417 08/18/19 1411 08/19/19 0848  WBC 2.6* 2.5* 2.4*  --  3.9*  NEUTROABS 1.6*  --   --   --   --   HGB 4.4* 7.1* 6.7* 9.8* 10.3*  HCT 14.4* 21.7* 20.9* 29.9* 32.3*  MCV 109.9* 95.2 98.1  --  94.4  PLT 94* 90* 89*  --  114*   Cardiac Enzymes: No results for input(s): CKTOTAL, CKMB, CKMBINDEX, TROPONINI in the last 168 hours. BNP: Invalid input(s): POCBNP CBG: Recent Labs  Lab 08/18/19 1958 08/18/19 2358 08/19/19 0425 08/19/19 0730 08/19/19 1125  GLUCAP 182* 148* 143* 179* 98   D-Dimer No results for input(s): DDIMER in the last 72 hours. Hgb A1c No results for input(s): HGBA1C in the last 72 hours. Lipid Profile No results for input(s): CHOL, HDL, LDLCALC, TRIG, CHOLHDL, LDLDIRECT in the last 72 hours. Thyroid function studies No results for input(s): TSH, T4TOTAL, T3FREE, THYROIDAB in the last 72 hours.  Invalid input(s): FREET3 Anemia work up No results for input(s): VITAMINB12, FOLATE, FERRITIN, TIBC, IRON, RETICCTPCT in the last 72 hours. Urinalysis    Component Value Date/Time   COLORURINE YELLOW 02/25/2017 1224   APPEARANCEUR HAZY (A) 02/25/2017 1224   LABSPEC 1.013 02/25/2017 1224   PHURINE 8.0 02/25/2017 1224   GLUCOSEU >=500  (A) 02/25/2017 1224   HGBUR SMALL (A) 02/25/2017 1224   BILIRUBINUR NEGATIVE 02/25/2017 1224   BILIRUBINUR neg 12/13/2014 1127   KETONESUR NEGATIVE 02/25/2017 1224   PROTEINUR >=300 (A) 02/25/2017 1224  UROBILINOGEN 0.2 05/01/2015 1252   NITRITE NEGATIVE 02/25/2017 1224   LEUKOCYTESUR NEGATIVE 02/25/2017 1224   Sepsis Labs Invalid input(s): PROCALCITONIN,  WBC,  LACTICIDVEN Microbiology Recent Results (from the past 240 hour(s))  Respiratory Panel by RT PCR (Flu A&B, Covid) - Nasopharyngeal Swab     Status: None   Collection Time: 08/16/19 11:20 PM   Specimen: Nasopharyngeal Swab  Result Value Ref Range Status   SARS Coronavirus 2 by RT PCR NEGATIVE NEGATIVE Final    Comment: (NOTE) SARS-CoV-2 target nucleic acids are NOT DETECTED. The SARS-CoV-2 RNA is generally detectable in upper respiratoy specimens during the acute phase of infection. The lowest concentration of SARS-CoV-2 viral copies this assay can detect is 131 copies/mL. A negative result does not preclude SARS-Cov-2 infection and should not be used as the sole basis for treatment or other patient management decisions. A negative result may occur with  improper specimen collection/handling, submission of specimen other than nasopharyngeal swab, presence of viral mutation(s) within the areas targeted by this assay, and inadequate number of viral copies (<131 copies/mL). A negative result must be combined with clinical observations, patient history, and epidemiological information. The expected result is Negative. Fact Sheet for Patients:  PinkCheek.be Fact Sheet for Healthcare Providers:  GravelBags.it This test is not yet ap proved or cleared by the [place] FDA and  has been authorized for detection and/or diagnosis of SARS-CoV-2 by FDA under an Emergency Use Authorization (EUA). This EUA will remain  in effect (meaning this test can be used) for the  duration of the COVID-19 declaration under Section 564(b)(1) of the Act, 21 U.S.C. section 360bbb-3(b)(1), unless the authorization is terminated or revoked sooner.    Influenza A by PCR NEGATIVE NEGATIVE Final   Influenza B by PCR NEGATIVE NEGATIVE Final    Comment: (NOTE) The Xpert Xpress SARS-CoV-2/FLU/RSV assay is intended as an aid in  the diagnosis of influenza from Nasopharyngeal swab specimens and  should not be used as a sole basis for treatment. Nasal washings and  aspirates are unacceptable for Xpert Xpress SARS-CoV-2/FLU/RSV  testing. Fact Sheet for Patients: PinkCheek.be Fact Sheet for Healthcare Providers: GravelBags.it This test is not yet approved or cleared by the [place] FDA and  has been authorized for detection and/or diagnosis of SARS-CoV-2 by  FDA under an Emergency Use Authorization (EUA). This EUA will remain  in effect (meaning this test can be used) for the duration of the  Covid-19 declaration under Section 564(b)(1) of the Act, 21  U.S.C. section 360bbb-3(b)(1), unless the authorization is  terminated or revoked. Performed at [facility], [place] [address] [place], [place] [address]      Time coordinating discharge: 35  minutes  SIGNED: Antonieta Pert, MD  [facility] 08/19/2019, 12:59 PM  If 7PM-7AM, please contact night-coverage [email or web address]

## 2019-08-19 NOTE — Anesthesia Postprocedure Evaluation (Signed)
Anesthesia Post Note  Patient: [name]  Procedure(s) Performed: COLONOSCOPY WITH PROPOFOL (N/A ) ESOPHAGOGASTRODUODENOSCOPY (EGD) WITH PROPOFOL (N/A ) HOT HEMOSTASIS (ARGON PLASMA COAGULATION/BICAP) (N/A )     Patient location during evaluation: Endoscopy Anesthesia Type: MAC Level of consciousness: awake and alert Pain management: pain level controlled Vital Signs Assessment: post-procedure vital signs reviewed and stable Respiratory status: spontaneous breathing, nonlabored ventilation, respiratory function stable and patient connected to nasal cannula oxygen Cardiovascular status: blood pressure returned to baseline and stable Postop Assessment: no apparent nausea or vomiting Anesthetic complications: no    Last Vitals:  Vitals:   08/18/19 2209 08/19/19 0424  BP:  (!) 156/69  Pulse:  83  Resp:  18  Temp: 37.3 C 37.6 C  SpO2:  96%    Last Pain:  Vitals:   08/19/19 0850  TempSrc:   PainSc: 0-No pain                 Marelly Wehrman S

## 2019-08-30 ENCOUNTER — Other Ambulatory Visit: Payer: Self-pay | Admitting: Internal Medicine

## 2019-08-30 DIAGNOSIS — Z1231 Encounter for screening mammogram for malignant neoplasm of breast: Secondary | ICD-10-CM

## 2019-09-16 ENCOUNTER — Other Ambulatory Visit: Payer: Self-pay

## 2019-09-16 ENCOUNTER — Inpatient Hospital Stay (HOSPITAL_COMMUNITY)
Admission: EM | Admit: 2019-09-16 | Discharge: 2019-09-21 | DRG: 377 | Disposition: A | Discharge status: Hospice | Payer: Medicare Other | Source: Ambulatory Visit | Attending: Internal Medicine | Admitting: Internal Medicine

## 2019-09-16 ENCOUNTER — Encounter (HOSPITAL_COMMUNITY): Payer: Self-pay

## 2019-09-16 DIAGNOSIS — E1140 Type 2 diabetes mellitus with diabetic neuropathy, unspecified: Secondary | ICD-10-CM | POA: Diagnosis present

## 2019-09-16 DIAGNOSIS — K219 Gastro-esophageal reflux disease without esophagitis: Secondary | ICD-10-CM | POA: Diagnosis present

## 2019-09-16 DIAGNOSIS — I5042 Chronic combined systolic (congestive) and diastolic (congestive) heart failure: Secondary | ICD-10-CM | POA: Diagnosis present

## 2019-09-16 DIAGNOSIS — I4581 Long QT syndrome: Secondary | ICD-10-CM | POA: Diagnosis present

## 2019-09-16 DIAGNOSIS — Z79899 Other long term (current) drug therapy: Secondary | ICD-10-CM

## 2019-09-16 DIAGNOSIS — E1122 Type 2 diabetes mellitus with diabetic chronic kidney disease: Secondary | ICD-10-CM | POA: Diagnosis present

## 2019-09-16 DIAGNOSIS — D631 Anemia in chronic kidney disease: Secondary | ICD-10-CM | POA: Diagnosis present

## 2019-09-16 DIAGNOSIS — D62 Acute posthemorrhagic anemia: Secondary | ICD-10-CM | POA: Diagnosis present

## 2019-09-16 DIAGNOSIS — E7800 Pure hypercholesterolemia, unspecified: Secondary | ICD-10-CM | POA: Diagnosis present

## 2019-09-16 DIAGNOSIS — K5521 Angiodysplasia of colon with hemorrhage: Principal | ICD-10-CM | POA: Diagnosis present

## 2019-09-16 DIAGNOSIS — K625 Hemorrhage of anus and rectum: Secondary | ICD-10-CM

## 2019-09-16 DIAGNOSIS — Z8673 Personal history of transient ischemic attack (TIA), and cerebral infarction without residual deficits: Secondary | ICD-10-CM | POA: Diagnosis not present

## 2019-09-16 DIAGNOSIS — N186 End stage renal disease: Secondary | ICD-10-CM | POA: Diagnosis present

## 2019-09-16 DIAGNOSIS — K7460 Unspecified cirrhosis of liver: Secondary | ICD-10-CM | POA: Diagnosis present

## 2019-09-16 DIAGNOSIS — H409 Unspecified glaucoma: Secondary | ICD-10-CM | POA: Diagnosis present

## 2019-09-16 DIAGNOSIS — Z515 Encounter for palliative care: Secondary | ICD-10-CM | POA: Diagnosis not present

## 2019-09-16 DIAGNOSIS — B1920 Unspecified viral hepatitis C without hepatic coma: Secondary | ICD-10-CM | POA: Diagnosis present

## 2019-09-16 DIAGNOSIS — E8889 Other specified metabolic disorders: Secondary | ICD-10-CM | POA: Diagnosis present

## 2019-09-16 DIAGNOSIS — F319 Bipolar disorder, unspecified: Secondary | ICD-10-CM | POA: Diagnosis present

## 2019-09-16 DIAGNOSIS — E1165 Type 2 diabetes mellitus with hyperglycemia: Secondary | ICD-10-CM | POA: Diagnosis present

## 2019-09-16 DIAGNOSIS — Z8614 Personal history of Methicillin resistant Staphylococcus aureus infection: Secondary | ICD-10-CM

## 2019-09-16 DIAGNOSIS — Z7189 Other specified counseling: Secondary | ICD-10-CM | POA: Diagnosis not present

## 2019-09-16 DIAGNOSIS — K5900 Constipation, unspecified: Secondary | ICD-10-CM | POA: Diagnosis present

## 2019-09-16 DIAGNOSIS — Z90710 Acquired absence of both cervix and uterus: Secondary | ICD-10-CM

## 2019-09-16 DIAGNOSIS — Z91040 Latex allergy status: Secondary | ICD-10-CM

## 2019-09-16 DIAGNOSIS — Z833 Family history of diabetes mellitus: Secondary | ICD-10-CM

## 2019-09-16 DIAGNOSIS — G894 Chronic pain syndrome: Secondary | ICD-10-CM | POA: Diagnosis present

## 2019-09-16 DIAGNOSIS — Z89432 Acquired absence of left foot: Secondary | ICD-10-CM

## 2019-09-16 DIAGNOSIS — Z992 Dependence on renal dialysis: Secondary | ICD-10-CM

## 2019-09-16 DIAGNOSIS — M109 Gout, unspecified: Secondary | ICD-10-CM | POA: Diagnosis present

## 2019-09-16 DIAGNOSIS — N2581 Secondary hyperparathyroidism of renal origin: Secondary | ICD-10-CM | POA: Diagnosis present

## 2019-09-16 DIAGNOSIS — K922 Gastrointestinal hemorrhage, unspecified: Secondary | ICD-10-CM | POA: Diagnosis not present

## 2019-09-16 DIAGNOSIS — K5520 Angiodysplasia of colon without hemorrhage: Secondary | ICD-10-CM | POA: Diagnosis not present

## 2019-09-16 DIAGNOSIS — Z888 Allergy status to other drugs, medicaments and biological substances status: Secondary | ICD-10-CM

## 2019-09-16 DIAGNOSIS — Z20822 Contact with and (suspected) exposure to covid-19: Secondary | ICD-10-CM | POA: Diagnosis present

## 2019-09-16 DIAGNOSIS — F259 Schizoaffective disorder, unspecified: Secondary | ICD-10-CM | POA: Diagnosis present

## 2019-09-16 DIAGNOSIS — Z8249 Family history of ischemic heart disease and other diseases of the circulatory system: Secondary | ICD-10-CM

## 2019-09-16 DIAGNOSIS — Z880 Allergy status to penicillin: Secondary | ICD-10-CM

## 2019-09-16 DIAGNOSIS — F17210 Nicotine dependence, cigarettes, uncomplicated: Secondary | ICD-10-CM | POA: Diagnosis present

## 2019-09-16 DIAGNOSIS — I132 Hypertensive heart and chronic kidney disease with heart failure and with stage 5 chronic kidney disease, or end stage renal disease: Secondary | ICD-10-CM | POA: Diagnosis present

## 2019-09-16 DIAGNOSIS — K644 Residual hemorrhoidal skin tags: Secondary | ICD-10-CM | POA: Diagnosis present

## 2019-09-16 DIAGNOSIS — Z794 Long term (current) use of insulin: Secondary | ICD-10-CM

## 2019-09-16 DIAGNOSIS — R112 Nausea with vomiting, unspecified: Secondary | ICD-10-CM

## 2019-09-16 DIAGNOSIS — I10 Essential (primary) hypertension: Secondary | ICD-10-CM | POA: Diagnosis not present

## 2019-09-16 HISTORY — DX: Hemorrhage of anus and rectum: K62.5

## 2019-09-16 LAB — PROTIME-INR
INR: 1.3 — ABNORMAL HIGH (ref 0.8–1.2)
Prothrombin Time: 16 seconds — ABNORMAL HIGH (ref 11.4–15.2)

## 2019-09-16 LAB — GLUCOSE, CAPILLARY
Glucose-Capillary: 163 mg/dL — ABNORMAL HIGH (ref 70–99)
Glucose-Capillary: 160 mg/dL — ABNORMAL HIGH (ref 70–99)
Glucose-Capillary: 130 mg/dL — ABNORMAL HIGH (ref 70–99)
Glucose-Capillary: 212 mg/dL — ABNORMAL HIGH (ref 70–99)
Glucose-Capillary: 130 mg/dL — ABNORMAL HIGH (ref 70–99)

## 2019-09-16 LAB — COMPREHENSIVE METABOLIC PANEL
ALT: 35 U/L (ref 0–44)
AST: 46 U/L — ABNORMAL HIGH (ref 15–41)
Albumin: 2.7 g/dL — ABNORMAL LOW (ref 3.5–5.0)
Alkaline Phosphatase: 76 U/L (ref 38–126)
Anion gap: 14 (ref 5–15)
BUN: 20 mg/dL (ref 6–20)
CO2: 29 mmol/L (ref 22–32)
Calcium: 8.7 mg/dL — ABNORMAL LOW (ref 8.9–10.3)
Chloride: 95 mmol/L — ABNORMAL LOW (ref 98–111)
Creatinine, Ser: 3.64 mg/dL — ABNORMAL HIGH (ref 0.44–1.00)
GFR calc Af Amer: 16 mL/min — ABNORMAL LOW (ref >60)
GFR calc non Af Amer: 13 mL/min — ABNORMAL LOW (ref >60)
Glucose, Bld: 218 mg/dL — ABNORMAL HIGH (ref 70–99)
Potassium: 4.3 mmol/L (ref 3.5–5.1)
Sodium: 138 mmol/L (ref 135–145)
Total Bilirubin: 0.4 mg/dL (ref 0.3–1.2)
Total Protein: 5.7 g/dL — ABNORMAL LOW (ref 6.5–8.1)

## 2019-09-16 LAB — RESPIRATORY PANEL BY RT PCR (FLU A&B, COVID)
Influenza A by PCR: NEGATIVE
Influenza B by PCR: NEGATIVE
SARS Coronavirus 2 by RT PCR: NEGATIVE

## 2019-09-16 LAB — PREPARE RBC (CROSSMATCH)

## 2019-09-16 LAB — POC OCCULT BLOOD, ED: Fecal Occult Bld: POSITIVE — AB

## 2019-09-16 LAB — CBC
HCT: 13.8 % — ABNORMAL LOW (ref 36.0–46.0)
Hemoglobin: 4.1 g/dL — CL (ref 12.0–15.0)
MCH: 34.7 pg — ABNORMAL HIGH (ref 26.0–34.0)
MCHC: 29.7 g/dL — ABNORMAL LOW (ref 30.0–36.0)
MCV: 116.9 fL — ABNORMAL HIGH (ref 80.0–100.0)
Platelets: 194 10*3/uL (ref 150–400)
RBC: 1.18 MIL/uL — ABNORMAL LOW (ref 3.87–5.11)
RDW: 21.7 % — ABNORMAL HIGH (ref 11.5–15.5)
WBC: 5.7 10*3/uL (ref 4.0–10.5)
nRBC: 3.7 % — ABNORMAL HIGH (ref 0.0–0.2)

## 2019-09-16 MED ORDER — SODIUM CHLORIDE 0.9 % IV SOLN: 62.5000 mg | INTRAVENOUS | Status: DC

## 2019-09-16 MED ORDER — ONDANSETRON HCL 4 MG PO TABS: 4.0000 mg | ORAL_TABLET | Freq: Four times a day (QID) | ORAL | Status: DC | PRN

## 2019-09-16 MED ORDER — SODIUM CHLORIDE 0.9% IV SOLUTION
Freq: Once | INTRAVENOUS | Status: AC
  Administered 2019-09-16: 10 mL/h via INTRAVENOUS

## 2019-09-16 MED ORDER — ALBUTEROL SULFATE (2.5 MG/3ML) 0.083% IN NEBU
3.0000 mL | INHALATION_SOLUTION | RESPIRATORY_TRACT | Status: DC | PRN
  Administered 2019-09-17 – 2019-09-21 (×2): 3 mL via RESPIRATORY_TRACT
  Filled 2019-09-16 (×3): qty 3

## 2019-09-16 MED ORDER — CALCITRIOL 0.5 MCG PO CAPS
3.0000 ug | ORAL_CAPSULE | ORAL | Status: DC
  Administered 2019-09-17: 3 ug via ORAL
  Filled 2019-09-16: qty 12

## 2019-09-16 MED ORDER — ONDANSETRON HCL 4 MG/2ML IJ SOLN
4.0000 mg | Freq: Four times a day (QID) | INTRAMUSCULAR | Status: DC | PRN
  Administered 2019-09-19: 4 mg via INTRAVENOUS
  Filled 2019-09-16 (×2): qty 2

## 2019-09-16 MED ORDER — MORPHINE SULFATE (PF) 2 MG/ML IV SOLN: 1.0000 mg | INTRAVENOUS | Status: DC | PRN

## 2019-09-16 MED ORDER — SODIUM CHLORIDE 0.9% IV SOLUTION: Freq: Once | INTRAVENOUS | Status: DC

## 2019-09-16 MED ORDER — PANTOPRAZOLE SODIUM 40 MG IV SOLR
40.0000 mg | Freq: Two times a day (BID) | INTRAVENOUS | Status: DC
  Administered 2019-09-16 – 2019-09-21 (×11): 40 mg via INTRAVENOUS
  Filled 2019-09-16 (×11): qty 40

## 2019-09-16 MED ORDER — INSULIN ASPART 100 UNIT/ML ~~LOC~~ SOLN
0.0000 [IU] | SUBCUTANEOUS | Status: DC
  Administered 2019-09-16: 2 [IU] via SUBCUTANEOUS
  Administered 2019-09-16: 1 [IU] via SUBCUTANEOUS
  Administered 2019-09-16: 2 [IU] via SUBCUTANEOUS
  Administered 2019-09-16: 3 [IU] via SUBCUTANEOUS

## 2019-09-16 MED ORDER — LABETALOL HCL 5 MG/ML IV SOLN
10.0000 mg | INTRAVENOUS | Status: DC | PRN
  Administered 2019-09-16 – 2019-09-21 (×7): 10 mg via INTRAVENOUS
  Filled 2019-09-16 (×7): qty 4

## 2019-09-16 MED ORDER — MORPHINE SULFATE (PF) 2 MG/ML IV SOLN
2.0000 mg | INTRAVENOUS | Status: DC | PRN
  Administered 2019-09-16 – 2019-09-21 (×7): 2 mg via INTRAVENOUS
  Filled 2019-09-16 (×7): qty 1

## 2019-09-16 MED ORDER — ACETAMINOPHEN 325 MG PO TABS
650.0000 mg | ORAL_TABLET | Freq: Four times a day (QID) | ORAL | Status: DC | PRN
  Administered 2019-09-19 – 2019-09-21 (×6): 650 mg via ORAL
  Filled 2019-09-16 (×6): qty 2

## 2019-09-16 MED ORDER — FERRIC CITRATE 1 GM 210 MG(FE) PO TABS
420.0000 mg | ORAL_TABLET | Freq: Three times a day (TID) | ORAL | Status: DC
  Administered 2019-09-16 – 2019-09-21 (×9): 420 mg via ORAL
  Filled 2019-09-16 (×16): qty 2

## 2019-09-16 MED ORDER — ACETAMINOPHEN 650 MG RE SUPP: 650.0000 mg | Freq: Four times a day (QID) | RECTAL | Status: DC | PRN

## 2019-09-16 NOTE — Progress Notes (Signed)
Patient ID: [name], female   DOB: 04/13/1966, 54 y.o.   MRN: [id number] Patient was admitted early this morning for rectal bleeding.  Hemoglobin was 4.1 on presentation.  Packed red cells transfusion has been ordered.  I have reviewed patient medical records including this morning's H&P, current vitals, labs and medications myself.  Have seen and examined the patient at bedside and plan of care discussed with her.  I have notified nephrology and  GI regarding her.  Repeat a.m. labs.  Monitor H&H.

## 2019-09-16 NOTE — Progress Notes (Signed)
CRITICAL VALUE ALERT  Critical Value:  Hgb 6.6  Date & Time Notied:  2/11 2049  Provider Notified: Kennon Holter  Orders Received/Actions taken: awaiting call back

## 2019-09-16 NOTE — Progress Notes (Signed)
Received patient to room 4e03 from ED, patient altert and oriented without complaints. Patient refused tele monitor and CCMD notified. CHG bath given. Oriented to room and call [place] within reach.

## 2019-09-16 NOTE — ED Notes (Signed)
Informed Consent was acquired from pt; copy sent to blood bank.

## 2019-09-16 NOTE — Consult Note (Signed)
Referring Provider:  [facility]         Primary Care Physician:  Rocco Serene, MD Primary Gastroenterologist:   unassigned    ( we saw for same problem within the last 30 days)       We were asked to see this patient for:    anemia  / GI bleed            [name] /  PLAN    54 yo female with [name], [name], [name], [name], [name], [name], [name],  [name] disorder.  1. Recurrent hematochezia with lower abdominal discomfort. Multiple endoscopic evaluations Rule out recurrent colonic AVMs.   --she is getting blood transfusion now --No [name] since arriving to hospital around 1 am. If bleeds again consider CTA vrs [name] scan depending on rapidity of [name].   2. [name], doesn't seem to have associated portal [name].   HPI:    Chief Complaint: GI bleed.   SOPHEE MCKIMMY is a 54 y.o. female with [name], [name], [name], [name], [name], [name], anxeity, substance abuse, [name] disorder.   Over the last 3-4 years patient [name] had many inpatient endoscopic evaluations  for GI bleeds/ anemia requiring multiple blood transfusions. including EGD ( negative), VCE ( negative), push enteroscopy ( negative), flexible sigmoidoscopy ( negative)  Colonoscopies negative except for right colon angioectasia in January 2021. Prior to that,  in December she did have a positive RBC scan with active [name] in right colon and underwent coil embolization on 07/14/19. She rebled in mid January. Repeat EGD 08/18/19 was normal.  Repeat colonoscopy 08/18/19 remarkable for a single colonic angioectasia, another possible angioectasia. Treated with APC. Friability without [name] in transverse, ascending colon and cecum, sigmoid diverticulosis. Hgb 08/19/19 was 10.3.   Patient presented to ED around 1am with a weeks worth of rectal [name],  fatigue. Per patient, she started having lower abdominal pain and [name] 7 days ago. The [name] was intermittent, not necessarily related to bowels movements. Black and red blood would just "pour out" when laying in bed.  She thinks the color of her stools [name] been normal. After dialysis yesterday patient says she experienced increased [name]. Hgb at dialysis was around 4, she was directed to ED. Had an episode of hematochezia when EMS picked her up but no [name] since.hgb 4.1, MCV 116, platelets 194, INR 1.3 . Denies NSAID use.    Past Medical History:  Diagnosis Date  . Anemia   . [name]   . Bipolar disorder, unspecified ([place]) 02/06/2014  . 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 [name] pain syndrome   . Depression   . [name] on hemodialysis (HCC)    TTS  . GERD (gastroesophageal reflux disease)   . Glaucoma   . Headache    sinus headaches  . Hepatitis C   . High cholesterol   . History of hiatal hernia   . History of vitamin D deficiency 06/20/2015  . Hypertension   . MRSA infection ?2014   "on the back of my head; spread throughout my bloodstream"  . Neuropathy   . Rectal [name] 09/16/2019  . Refusal of blood transfusions as patient is Jehovah's Witness    occasionally allows transfusions.    . [name] disorder, unspecified condition 02/08/2014  .  Seizure [facility]) dx'd 2014   "don't know what kind; last one was ~ 04/2014"  . TIA (transient ischemic attack) 2010  . Type II diabetes mellitus ([place])    INSULIN DEPENDENT    Past Surgical History:  Procedure Laterality Date  . ABDOMINAL HYSTERECTOMY  2014  . ABDOMINAL HYSTERECTOMY  2010  . AMPUTATION Left 05/21/2013   Procedure: Left Midfoot AMPUTATION;  Surgeon: Newt Minion, MD;  Location: [place];  Service: Orthopedics;  Laterality: Left;  Left Midfoot Amputation  . AV FISTULA PLACEMENT Left 05/04/2015   Procedure: ARTERIOVENOUS (AV)  GRAFT INSERTION;  Surgeon: Angelia Mould, MD;  Location: [place];  Service: Vascular;  Laterality: Left;  . BIOPSY  11/18/2018   Procedure: BIOPSY;  Surgeon: Jerene Bears, MD;  Location: [facility] ENDOSCOPY;  Service: Gastroenterology;;  . COLONOSCOPY WITH PROPOFOL N/A 06/22/2013   Procedure: COLONOSCOPY WITH PROPOFOL;  Surgeon: Jeryl Columbia, MD;  Location: [facility] ENDOSCOPY;  Service: Endoscopy;  Laterality: N/A;  . COLONOSCOPY WITH PROPOFOL N/A 11/18/2018   Procedure: COLONOSCOPY WITH PROPOFOL;  Surgeon: Jerene Bears, MD;  Location: [place];  Service: Gastroenterology;  Laterality: N/A;  . COLONOSCOPY WITH PROPOFOL N/A 08/18/2019   Procedure: COLONOSCOPY WITH PROPOFOL;  Surgeon: Jerene Bears, MD;  Location: [place];  Service: Gastroenterology;  Laterality: N/A;  . ENTEROSCOPY N/A 12/31/2018   Procedure: ENTEROSCOPY;  Surgeon: Irene Shipper, MD;  Location: [facility] ENDOSCOPY;  Service: Endoscopy;  Laterality: N/A;  Push enteroscopy at 8am (spoke with Endo)  . ESOPHAGOGASTRODUODENOSCOPY N/A 05/11/2015   Procedure: ESOPHAGOGASTRODUODENOSCOPY (EGD);  Surgeon: Laurence Spates, MD;  Location: [facility] ENDOSCOPY;  Service: Endoscopy;  Laterality: N/A;  . ESOPHAGOGASTRODUODENOSCOPY (EGD) WITH PROPOFOL N/A 11/18/2018   Procedure: ESOPHAGOGASTRODUODENOSCOPY (EGD) WITH PROPOFOL;  Surgeon: Jerene Bears, MD;  Location: [facility]COPY;  Service: Gastroenterology;  Laterality: N/A;  . ESOPHAGOGASTRODUODENOSCOPY (EGD) WITH PROPOFOL N/A 05/15/2019   Procedure: ESOPHAGOGASTRODUODENOSCOPY (EGD) WITH PROPOFOL;  Surgeon: Jackquline Denmark, MD;  Location: [facility] ENDOSCOPY;  Service: Endoscopy;  Laterality: N/A;  . ESOPHAGOGASTRODUODENOSCOPY (EGD) WITH PROPOFOL N/A 08/18/2019   Procedure: ESOPHAGOGASTRODUODENOSCOPY (EGD) WITH PROPOFOL;  Surgeon: Jerene Bears, MD;  Location: [facility] ENDOSCOPY;  Service: Gastroenterology;  Laterality: N/A;  . EYE SURGERY Bilateral 2010   Lasik  . FLEXIBLE SIGMOIDOSCOPY N/A 01/17/2017   Procedure: FLEXIBLE  SIGMOIDOSCOPY w/ hemorrhoid banding possible;  Surgeon: Gatha Mayer, MD;  Location: [facility] ENDOSCOPY;  Service: Endoscopy;  Laterality: N/A;  . FLEXIBLE SIGMOIDOSCOPY N/A 01/21/2019   Procedure: FLEXIBLE SIGMOIDOSCOPY;  Surgeon: Ronald Lobo, MD;  Location: [facility] ENDOSCOPY;  Service: Endoscopy;  Laterality: N/A;  Plan to do the test unprepped, unsedated  . FOOT AMPUTATION Left    due diabetic neuropathy, could not feel ulcer on bottom of foot  . GIVENS CAPSULE STUDY N/A 01/21/2019   Procedure: GIVENS CAPSULE STUDY;  Surgeon: Ronald Lobo, MD;  Location: [facility] ENDOSCOPY;  Service: Endoscopy;  Laterality: N/A;  . HOT HEMOSTASIS N/A 08/18/2019   Procedure: HOT HEMOSTASIS (ARGON PLASMA COAGULATION/BICAP);  Surgeon: Jerene Bears, MD;  Location: [facility] ENDOSCOPY;  Service: Gastroenterology;  Laterality: N/A;  . IR ANGIOGRAM SELECTIVE EACH ADDITIONAL VESSEL  07/13/2019  . IR ANGIOGRAM SELECTIVE EACH ADDITIONAL VESSEL  07/13/2019  . IR ANGIOGRAM SELECTIVE EACH ADDITIONAL VESSEL  07/13/2019  . IR ANGIOGRAM SELECTIVE EACH ADDITIONAL VESSEL  07/13/2019  . IR ANGIOGRAM VISCERAL SELECTIVE  07/13/2019  . IR EMBO ART  VEN HEMORR LYMPH EXTRAV  INC GUIDE ROADMAPPING  07/13/2019  . POLYPECTOMY  11/18/2018  Procedure: POLYPECTOMY;  Surgeon: Jerene Bears, MD;  Location: [facility] ENDOSCOPY;  Service: Gastroenterology;;  . REFRACTIVE SURGERY Bilateral 2013  . TONSILLECTOMY  1970's  . TUBAL LIGATION  2000    Prior to Admission medications   Medication Sig Start Date End Date Taking? Authorizing Provider  acetaminophen (TYLENOL) 325 MG tablet Take 2 tablets (650 mg total) by mouth every 6 (six) hours as needed for fever, headache or moderate pain. 02/26/19   Roxan Hockey, MD  albuterol (PROVENTIL) (2.5 MG/3ML) 0.083% nebulizer solution Take 6 mLs (5 mg total) by nebulization daily as needed for wheezing or shortness of breath. 08/19/19   Antonieta Pert, MD  albuterol (VENTOLIN HFA) 108 (90 Base) MCG/ACT inhaler Inhale 2 puffs into  the lungs every 4 (four) hours as needed for wheezing or shortness of breath. 01/26/19   Dahal, Marlowe Aschoff, MD  allopurinol (ZYLOPRIM) 100 MG tablet Take 2 tablets (200 mg total) by mouth daily. 02/26/19 08/15/28  Roxan Hockey, MD  amLODipine (NORVASC) 10 MG tablet Take 1 tablet (10 mg total) by mouth daily. 07/16/19   Pokhrel, Corrie Mckusick, MD  atorvastatin (LIPITOR) 40 MG tablet Take 1 tablet (40 mg total) by mouth at bedtime. 02/26/19   Roxan Hockey, MD  Darbepoetin Alfa (ARANESP) 200 MCG/0.4ML SOSY injection Inject 0.4 mLs (200 mcg total) into the vein every Wednesday with hemodialysis. 01/27/19   Terrilee Croak, MD  diphenhydrAMINE (BENADRYL) 25 mg capsule Take 1 capsule (25 mg total) by mouth every 6 (six) hours as needed for up to 8 days for itching. 01/26/19 02/25/28  Dahal, Marlowe Aschoff, MD  furosemide (LASIX) 80 MG tablet Take 80 mg by mouth 2 (two) times daily.    [provider]  hydrALAZINE (APRESOLINE) 25 MG tablet Take 25 mg by mouth 3 (three) times daily. 08/04/19   [provider]  hydrocortisone (ANUSOL-HC) 2.5 % rectal cream Place 1 application rectally 2 (two) times daily. 04/03/19   Danford, Suann Larry, MD  hydrOXYzine (ATARAX/VISTARIL) 25 MG tablet Take 1 tablet (25 mg total) by mouth 3 (three) times daily as needed for itching or [name]. 02/26/19   Roxan Hockey, MD  insulin aspart (NOVOLOG) 100 UNIT/ML injection 0-9 Units, Subcutaneous, 3 times daily with meals CBG < 70: implement hypoglycemia protocol CBG 70 - 120: 0 units CBG 121 - 150: 1 unit CBG 151 - 200: 2 units CBG 201 - 250: 3 units CBG 251 - 300: 5 units CBG 301 - 350: 7 units CBG 351 - 400: 9 units CBG > 400: call MD Patient taking differently: Inject 0-9 Units into the skin 3 (three) times daily with meals. 0-9 Units, Subcutaneous, 3 times daily with meals CBG < 70: implement hypoglycemia protocol CBG 70 - 120: 0 units CBG 121 - 150: 1 unit CBG 151 - 200: 2 units CBG 201 - 250: 3 units CBG 251 - 300: 5 units CBG  301 - 350: 7 units CBG 351 - 400: 9 units CBG > 400: call MD 01/26/19   Terrilee Croak, MD  insulin glargine (LANTUS) 100 UNIT/ML injection Inject 24 Units into the skin at bedtime.    [provider]  labetalol (NORMODYNE) 100 MG tablet Take 2 tablets (200 mg total) by mouth 2 (two) times daily. 08/19/19 09/18/19  Antonieta Pert, MD  lidocaine-prilocaine (EMLA) cream Apply 1 application topically See admin instructions. On dialysis days 12/23/18   [provider]  methocarbamol (ROBAXIN) 500 MG tablet Take 500 mg by mouth daily as needed for muscle spasms.  [provider]  mupirocin ointment (BACTROBAN) 2 % Apply 1 application topically as needed (on arm). 04/03/19   Danford, Suann Larry, MD  oxyCODONE (ROXICODONE) 15 MG immediate release tablet Take 15 mg by mouth every 4 (four) hours as needed for pain.  08/05/19   [provider]  oxymetazoline (AFRIN) 0.05 % nasal spray Place 2 sprays into both nostrils 2 (two) times daily. 08/19/19 09/18/19  Antonieta Pert, MD  pantoprazole (PROTONIX) 40 MG tablet Take 1 tablet (40 mg total) by mouth daily at 6 (six) AM. 08/19/19 09/18/19  Antonieta Pert, MD  Pramoxine-Menthol-Petrolatum (SARNA ULTRA EX) Apply 1 application topically daily as needed (itch).    [provider]  senna-docusate (SENOKOT-S) 8.6-50 MG tablet Take 2 tablets by mouth at bedtime. 02/26/19 02/26/20  Roxan Hockey, MD    Current Facility-Administered Medications  Medication Dose Route Frequency Provider Last Rate Last Admin  . acetaminophen (TYLENOL) tablet 650 mg  650 mg Oral Q6H PRN Rise Patience, MD       Or  . acetaminophen (TYLENOL) suppository 650 mg  650 mg Rectal Q6H PRN Rise Patience, MD      . albuterol (PROVENTIL) (2.5 MG/3ML) 0.083% nebulizer solution 3 mL  3 mL Inhalation Q4H PRN Rise Patience, MD      . insulin aspart (novoLOG) injection 0-9 Units  0-9 Units Subcutaneous Q4H Rise Patience, MD   2 Units at 09/16/19  [phone]  . labetalol (NORMODYNE) injection 10 mg  10 mg Intravenous Q2H PRN Rise Patience, MD      . morphine 2 MG/ML injection 2 mg  2 mg Intravenous Q4H PRN Rise Patience, MD   2 mg at 09/16/19 [id number]  . ondansetron (ZOFRAN) tablet 4 mg  4 mg Oral Q6H PRN Rise Patience, MD       Or  . ondansetron [facility]) injection 4 mg  4 mg Intravenous Q6H PRN Rise Patience, MD      . pantoprazole (PROTONIX) injection 40 mg  40 mg Intravenous Q12H Rise Patience, MD   40 mg at 09/16/19 1035    Allergies as of 09/16/2019 - Review Complete 09/16/2019  Allergen Reaction Noted  . Penicillins Hives and Other (See Comments) 09/30/2014  . Gabapentin Itching and Swelling 02/09/2013  . Lyrica [pregabalin] Swelling and Other (See Comments) 09/30/2014  . Saphris [asenapine] Swelling and Other (See Comments) 09/30/2014  . Tramadol Swelling and Other (See Comments) 09/30/2014  . Adhesive [tape] Itching 09/09/2017  . Latex Itching 09/09/2017  . Other Other (See Comments) 05/01/2015    Family History  Problem Relation Age of Onset  . Hypertension Mother   . Diabetes Mother   . Hypertension Father   . Diabetes Father     Social History   Socioeconomic History  . Marital status: Single    Spouse name: Not on file  . Number of children: 3  . Years of education: 42  . Highest education level: 12th grade  Occupational History  . Occupation: disabled  Tobacco Use  . Smoking status: Current Some Day Smoker    Packs/day: 1.00    Years: 36.00    Pack years: 36.00    Types: Cigarettes  . Smokeless tobacco: Never Used  . Tobacco comment: 5 cigs in one setting  Substance and Sexual Activity  . Alcohol use: Not Currently    Alcohol/week: 3.0 standard drinks    Types: 3 Standard drinks or equivalent per week  . Drug use:  Not Currently    Types: "Crack" cocaine, Cocaine    Comment: last use >2 years ago  . Sexual activity: Not Currently    Birth control/protection: Abstinence     Comment: offered condoms 05/2019  Other Topics Concern  . Not on file  Social History Narrative   ** Merged History Encounter **   Single, lives with son in a one story home. Unable to exercise. Avoids caffeine. 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       Social Determinants of Health   Financial Resource Strain:   . Difficulty of Paying Living Expenses: Not on file  Food Insecurity:   . Worried About Charity fundraiser in the Last Year: Not on file  . Ran Out of Food in the Last Year: Not on file  Transportation Needs:   . Lack of Transportation (Medical): Not on file  . Lack of Transportation (Non-Medical): Not on file  Physical Activity:   . Days of Exercise per Week: Not on file  . Minutes of Exercise per Session: Not on file  Stress:   . Feeling of Stress : Not on file  Social Connections:   . Frequency of Communication with Friends and Family: Not on file  . Frequency of Social Gatherings with Friends and Family: Not on file  . Attends Religious Services: Not on file  . Active Member of Clubs or Organizations: Not on file  . Attends Archivist Meetings: Not on file  . Marital Status: Not on file  Intimate Partner Violence:   . Fear of Current or Ex-Partner: Not on file  . Emotionally Abused: Not on file  . Physically Abused: Not on file  . Sexually Abused: Not on file    Review of Systems: All systems reviewed and negative except where noted in HPI.  Physical Exam: Vital signs in last 24 hours: Temp:  [98.3 F (36.8 C)-99.7 F (37.6 C)] 98.6 F (37 C) (02/11 1106) Pulse Rate:  [95-105] 99 (02/11 1030) Resp:  [11-34] 18 (02/11 1106) BP: (130-176)/(60-84) 160/60 (02/11 1106) SpO2:  [95 %-100 %] 99 % (02/11 1106) Weight:  [75.8 kg-75.9 kg] 75.9 kg (02/11 0500)   General:   Alert, well-developed,  female in NAD Psych:  Pleasant, cooperative. Normal mood and affect. Eyes:  Pupils equal, sclera clear, no  icterus.   Conjunctiva pink. Ears:  Normal auditory acuity. Nose:  No deformity, discharge,  or lesions. Neck:  Supple; no masses Lungs:  Clear throughout to auscultation.   No wheezes, crackles, or rhonchi.  Heart:  Regular rate and rhythm;  no lower extremity edema Abdomen:  Soft, non-distended, nontender, BS active, no palp mass   Rectal:  Deferred  Msk:  Symmetrical without gross deformities. . Neurologic:  Alert and  oriented x4;  grossly normal neurologically. Skin:  Intact without significant lesions or rashes.   Intake/Output from previous day: 02/10 0701 - 02/11 0700 In: 341.5 [I.V.:21.5; Blood:320] Out: -  Intake/Output this shift: Total I/O In: 896.5 [Blood:896.5] Out: -   Lab Results: Recent Labs    09/16/19 0229  WBC 5.7  HGB 4.1*  HCT 13.8*  PLT 194   BMET Recent Labs    09/16/19 0111  NA 138  K 4.3  CL 95*  CO2 29  GLUCOSE 218*  BUN 20  CREATININE 3.64*  CALCIUM 8.7*   LFT Recent Labs    09/16/19 0111  PROT 5.7*  ALBUMIN 2.7*  AST 46*  ALT 35  ALKPHOS 76  BILITOT 0.4   PT/INR Recent Labs    09/16/19 0111  LABPROT 16.0*  INR 1.3*   Hepatitis Panel No results for input(s): HEPBSAG, HCVAB, HEPAIGM, HEPBIGM in the last 72 hours.   . CBC Latest Ref Rng & Units 09/16/2019 08/19/2019 08/18/2019  WBC 4.0 - 10.5 K/uL 5.7 3.9(L) -  Hemoglobin 12.0 - 15.0 g/dL 4.1(LL) 10.3(L) 9.8(L)  Hematocrit 36.0 - 46.0 % 13.8(L) 32.3(L) 29.9(L)  Platelets 150 - 400 K/uL 194 114(L) -    . CMP Latest Ref Rng & Units 09/16/2019 08/19/2019 08/18/2019  Glucose 70 - 99 mg/dL 218(H) 188(H) 160(H)  BUN 6 - 20 mg/dL 20 24(H) 40(H)  Creatinine 0.44 - 1.00 mg/dL 3.64(H) 5.49(H) 7.97(H)  Sodium 135 - 145 mmol/L 138 139 136  Potassium 3.5 - 5.1 mmol/L 4.3 4.2 4.0  Chloride 98 - 111 mmol/L 95(L) 99 97(L)  CO2 22 - 32 mmol/L 29 25 21(L)  Calcium 8.9 - 10.3 mg/dL 8.7(L) 9.1 8.8(L)  Total Protein 6.5 - 8.1 g/dL 5.7(L) - -  Total Bilirubin 0.3 - 1.2 mg/dL 0.4 - -    Alkaline Phos 38 - 126 U/L 76 - -  AST 15 - 41 U/L 46(H) - -  ALT 0 - 44 U/L 35 - -   Studies/Results: No results found.  Active Problems:   External hemorrhoids   Acute blood [name] anemia   Acute GI [name]    Tye Savoy, NP-C @  09/16/2019, 11:41 AM

## 2019-09-16 NOTE — H&P (Signed)
History and Physical    [name] TKZ:[id number] DOB: 1966/03/24 DOA: 09/16/2019  PCP: Rocco Serene, MD  Patient coming from: Home.  Chief Complaint: Rectal bleeding.  HPI: [name] is a 54 y.o. female with history of ESRD on hemodialysis with recurrent GI bleed had been admitted in December when patient underwent RBC tagged scan and underwent angiogram which showed right colonic angiodysplasia and has successfully coil embolization at that time was admitted again in January 2021 underwent colonoscopy and EGD and had ablation of the angiodysplasia in the colon presents to the ER with having multiple episodes of melanotic dark stools with clots.  Patient states her bleeding started about a week ago which was ongoing for 4 days been stopped.  Last 3 days she did not have any bleed and after dialysis yesterday she had multiple episodes with lower abdominal quadrant pain.  No vomiting.  Denies any fever chills.  At the dialysis patient hemoglobin was around 4 was advised to come to the ER.  ED Course: In the ER patient is hemodynamically stable.  Blood test done in the ER shows hemoglobin of 4.1 platelets 194 INR 1.3 with blood glucose of 218 Covid test was negative.  2 units of PRBC transfusion has been ordered and admitted for acute GI bleed likely could be lower GI but has some melanotic component to.  Patient's abdominal pain is mostly in the lower quadrants have been radiating up to the epigastrium to the chest.  Shows normal sinus rhythm with prolonged QTC.  Review of Systems: As per HPI, rest all negative.   Past Medical History:  Diagnosis Date  . Anemia   . Anxiety   . Bipolar disorder, unspecified ([place]) 02/06/2014  . Chronic combined systolic and diastolic CHF (congestive heart failure) (HCC)    EF 40% by echo and 48% by stress test  . Chronic pain syndrome   . Depression   . ESRD on hemodialysis (HCC)    TTS  . GERD (gastroesophageal reflux disease)   . Glaucoma   .  Headache    sinus headaches  . Hepatitis C   . High cholesterol   . History of hiatal hernia   . History of vitamin D deficiency 06/20/2015  . Hypertension   . MRSA infection ?2014   "on the back of my head; spread throughout my bloodstream"  . Neuropathy   . Refusal of blood transfusions as patient is Jehovah's Witness    occasionally allows transfusions.    . Schizoaffective disorder, unspecified condition 02/08/2014  . Seizure [facility]) dx'd 2014   "don't know what kind; last one was ~ 04/2014"  . TIA (transient ischemic attack) 2010  . Type II diabetes mellitus ([place])    INSULIN DEPENDENT    Past Surgical History:  Procedure Laterality Date  . ABDOMINAL HYSTERECTOMY  2014  . ABDOMINAL HYSTERECTOMY  2010  . AMPUTATION Left 05/21/2013   Procedure: Left Midfoot AMPUTATION;  Surgeon: Newt Minion, MD;  Location: [place];  Service: Orthopedics;  Laterality: Left;  Left Midfoot Amputation  . AV FISTULA PLACEMENT Left 05/04/2015   Procedure: ARTERIOVENOUS (AV) GRAFT INSERTION;  Surgeon: Angelia Mould, MD;  Location: [place];  Service: Vascular;  Laterality: Left;  . BIOPSY  11/18/2018   Procedure: BIOPSY;  Surgeon: Jerene Bears, MD;  Location: [place];  Service: Gastroenterology;;  . COLONOSCOPY WITH PROPOFOL N/A 06/22/2013   Procedure: COLONOSCOPY WITH PROPOFOL;  Surgeon: Jeryl [place], MD;  Location: [facility]  ENDOSCOPY;  Service: Endoscopy;  Laterality: N/A;  . COLONOSCOPY WITH PROPOFOL N/A 11/18/2018   Procedure: COLONOSCOPY WITH PROPOFOL;  Surgeon: Jerene Bears, MD;  Location: [place];  Service: Gastroenterology;  Laterality: N/A;  . COLONOSCOPY WITH PROPOFOL N/A 08/18/2019   Procedure: COLONOSCOPY WITH PROPOFOL;  Surgeon: Jerene Bears, MD;  Location: [place];  Service: Gastroenterology;  Laterality: N/A;  . ENTEROSCOPY N/A 12/31/2018   Procedure: ENTEROSCOPY;  Surgeon: Irene Shipper, MD;  Location: [facility] ENDOSCOPY;  Service: Endoscopy;  Laterality: N/A;  Push enteroscopy at 8am  (spoke with Endo)  . ESOPHAGOGASTRODUODENOSCOPY N/A 05/11/2015   Procedure: ESOPHAGOGASTRODUODENOSCOPY (EGD);  Surgeon: Laurence Spates, MD;  Location: [facility] ENDOSCOPY;  Service: Endoscopy;  Laterality: N/A;  . ESOPHAGOGASTRODUODENOSCOPY (EGD) WITH PROPOFOL N/A 11/18/2018   Procedure: ESOPHAGOGASTRODUODENOSCOPY (EGD) WITH PROPOFOL;  Surgeon: Jerene Bears, MD;  Location: [facility] ENDOSCOPY;  Service: Gastroenterology;  Laterality: N/A;  . ESOPHAGOGASTRODUODENOSCOPY (EGD) WITH PROPOFOL N/A 05/15/2019   Procedure: ESOPHAGOGASTRODUODENOSCOPY (EGD) WITH PROPOFOL;  Surgeon: Jackquline Denmark, MD;  Location: [facility] ENDOSCOPY;  Service: Endoscopy;  Laterality: N/A;  . ESOPHAGOGASTRODUODENOSCOPY (EGD) WITH PROPOFOL N/A 08/18/2019   Procedure: ESOPHAGOGASTRODUODENOSCOPY (EGD) WITH PROPOFOL;  Surgeon: Jerene Bears, MD;  Location: [facility] ENDOSCOPY;  Service: Gastroenterology;  Laterality: N/A;  . EYE SURGERY Bilateral 2010   Lasik  . FLEXIBLE SIGMOIDOSCOPY N/A 01/17/2017   Procedure: FLEXIBLE SIGMOIDOSCOPY w/ hemorrhoid banding possible;  Surgeon: Gatha Mayer, MD;  Location: [facility] ENDOSCOPY;  Service: Endoscopy;  Laterality: N/A;  . FLEXIBLE SIGMOIDOSCOPY N/A 01/21/2019   Procedure: FLEXIBLE SIGMOIDOSCOPY;  Surgeon: Ronald Lobo, MD;  Location: [facility] ENDOSCOPY;  Service: Endoscopy;  Laterality: N/A;  Plan to do the test unprepped, unsedated  . FOOT AMPUTATION Left    due diabetic neuropathy, could not feel ulcer on bottom of foot  . GIVENS CAPSULE STUDY N/A 01/21/2019   Procedure: GIVENS CAPSULE STUDY;  Surgeon: Ronald Lobo, MD;  Location: [facility] ENDOSCOPY;  Service: Endoscopy;  Laterality: N/A;  . HOT HEMOSTASIS N/A 08/18/2019   Procedure: HOT HEMOSTASIS (ARGON PLASMA COAGULATION/BICAP);  Surgeon: Jerene Bears, MD;  Location: [facility] ENDOSCOPY;  Service: Gastroenterology;  Laterality: N/A;  . IR ANGIOGRAM SELECTIVE EACH ADDITIONAL VESSEL  07/13/2019  . IR ANGIOGRAM SELECTIVE EACH ADDITIONAL VESSEL  07/13/2019  . IR ANGIOGRAM SELECTIVE  EACH ADDITIONAL VESSEL  07/13/2019  . IR ANGIOGRAM SELECTIVE EACH ADDITIONAL VESSEL  07/13/2019  . IR ANGIOGRAM VISCERAL SELECTIVE  07/13/2019  . IR EMBO ART  VEN HEMORR LYMPH EXTRAV  INC GUIDE ROADMAPPING  07/13/2019  . POLYPECTOMY  11/18/2018   Procedure: POLYPECTOMY;  Surgeon: Jerene Bears, MD;  Location: [facility] ENDOSCOPY;  Service: Gastroenterology;;  . REFRACTIVE SURGERY Bilateral 2013  . TONSILLECTOMY  1970's  . TUBAL LIGATION  2000     reports that she has been smoking cigarettes. She has a 36.00 pack-year smoking history. She has never used smokeless tobacco. She reports previous alcohol use of about 3.0 standard drinks of alcohol per week. She reports previous drug use. Drugs: "Crack" cocaine and Cocaine.  Allergies  Allergen Reactions  . Penicillins Hives and Other (See Comments)    Has patient had a PCN reaction causing immediate rash, facial/tongue/throat swelling, SOB or lightheadedness with hypotension: Yes Has patient had a PCN reaction causing severe rash involving mucus membranes or skin necrosis: No Has patient had a PCN reaction that required hospitalization No Has patient had a PCN reaction occurring within the last 10 years: No If all of the above answers are "NO", then  may proceed with Cephalosporin use. Pt tolerates cefepime and ceftriaxone  . Gabapentin Itching and Swelling  . Lyrica [Pregabalin] Swelling and Other (See Comments)    Facial and leg swelling  . Saphris [Asenapine] Swelling and Other (See Comments)    Facial swelling  . Tramadol Swelling and Other (See Comments)    Leg swelling  . Adhesive [Tape] Itching    SEVERE ITCHING  . Latex Itching    SEVERE ITCHING  . Other Other (See Comments)    NO BLOOD PRODUCTS; PATIENT IS A JEHOVAH'S WITNESS    Family History  Problem Relation Age of Onset  . Hypertension Mother   . Diabetes Mother   . Hypertension Father   . Diabetes Father     Prior to Admission medications   Medication Sig Start Date End Date  Taking? Authorizing Provider  acetaminophen (TYLENOL) 325 MG tablet Take 2 tablets (650 mg total) by mouth every 6 (six) hours as needed for fever, headache or moderate pain. 02/26/19   Roxan Hockey, MD  albuterol (PROVENTIL) (2.5 MG/3ML) 0.083% nebulizer solution Take 6 mLs (5 mg total) by nebulization daily as needed for wheezing or shortness of breath. 08/19/19   Antonieta Pert, MD  albuterol (VENTOLIN HFA) 108 (90 Base) MCG/ACT inhaler Inhale 2 puffs into the lungs every 4 (four) hours as needed for wheezing or shortness of breath. 01/26/19   Dahal, Marlowe Aschoff, MD  allopurinol (ZYLOPRIM) 100 MG tablet Take 2 tablets (200 mg total) by mouth daily. 02/26/19 08/15/28  Roxan Hockey, MD  amLODipine (NORVASC) 10 MG tablet Take 1 tablet (10 mg total) by mouth daily. 07/16/19   Pokhrel, Corrie Mckusick, MD  atorvastatin (LIPITOR) 40 MG tablet Take 1 tablet (40 mg total) by mouth at bedtime. 02/26/19   Roxan Hockey, MD  Darbepoetin Alfa (ARANESP) 200 MCG/0.4ML SOSY injection Inject 0.4 mLs (200 mcg total) into the vein every Wednesday with hemodialysis. 01/27/19   Terrilee Croak, MD  diphenhydrAMINE (BENADRYL) 25 mg capsule Take 1 capsule (25 mg total) by mouth every 6 (six) hours as needed for up to 8 days for itching. 01/26/19 02/25/28  Dahal, Marlowe Aschoff, MD  furosemide (LASIX) 80 MG tablet Take 80 mg by mouth 2 (two) times daily.    [provider]  hydrALAZINE (APRESOLINE) 25 MG tablet Take 25 mg by mouth 3 (three) times daily. 08/04/19   [provider]  hydrocortisone (ANUSOL-HC) 2.5 % rectal cream Place 1 application rectally 2 (two) times daily. 04/03/19   Danford, Suann Larry, MD  hydrOXYzine (ATARAX/VISTARIL) 25 MG tablet Take 1 tablet (25 mg total) by mouth 3 (three) times daily as needed for itching or anxiety. 02/26/19   Roxan Hockey, MD  insulin aspart (NOVOLOG) 100 UNIT/ML injection 0-9 Units, Subcutaneous, 3 times daily with meals CBG < 70: implement hypoglycemia protocol CBG 70 - 120: 0  units CBG 121 - 150: 1 unit CBG 151 - 200: 2 units CBG 201 - 250: 3 units CBG 251 - 300: 5 units CBG 301 - 350: 7 units CBG 351 - 400: 9 units CBG > 400: call MD Patient taking differently: Inject 0-9 Units into the skin 3 (three) times daily with meals. 0-9 Units, Subcutaneous, 3 times daily with meals CBG < 70: implement hypoglycemia protocol CBG 70 - 120: 0 units CBG 121 - 150: 1 unit CBG 151 - 200: 2 units CBG 201 - 250: 3 units CBG 251 - 300: 5 units CBG 301 - 350: 7 units CBG 351 - 400: 9 units CBG >  400: call MD 01/26/19   Terrilee Croak, MD  insulin glargine (LANTUS) 100 UNIT/ML injection Inject 24 Units into the skin at bedtime.    [provider]  labetalol (NORMODYNE) 100 MG tablet Take 2 tablets (200 mg total) by mouth 2 (two) times daily. 08/19/19 09/18/19  Antonieta Pert, MD  lidocaine-prilocaine (EMLA) cream Apply 1 application topically See admin instructions. On dialysis days 12/23/18   [provider]  methocarbamol (ROBAXIN) 500 MG tablet Take 500 mg by mouth daily as needed for muscle spasms.    [provider]  mupirocin ointment (BACTROBAN) 2 % Apply 1 application topically as needed (on arm). 04/03/19   Danford, Suann Larry, MD  oxyCODONE (ROXICODONE) 15 MG immediate release tablet Take 15 mg by mouth every 4 (four) hours as needed for pain.  08/05/19   [provider]  oxymetazoline (AFRIN) 0.05 % nasal spray Place 2 sprays into both nostrils 2 (two) times daily. 08/19/19 09/18/19  Antonieta Pert, MD  pantoprazole (PROTONIX) 40 MG tablet Take 1 tablet (40 mg total) by mouth daily at 6 (six) AM. 08/19/19 09/18/19  Antonieta Pert, MD  Pramoxine-Menthol-Petrolatum (SARNA ULTRA EX) Apply 1 application topically daily as needed (itch).    [provider]  senna-docusate (SENOKOT-S) 8.6-50 MG tablet Take 2 tablets by mouth at bedtime. 02/26/19 02/26/20  Roxan Hockey, MD    Physical Exam: Constitutional: Moderately built and nourished. Vitals:   09/16/19  0400 09/16/19 0415 09/16/19 0430 09/16/19 0500  BP: (!) 155/64 (!) 167/61 (!) 165/67 (!) 145/64  Pulse: 95 (!) 101 97 100  Resp: 11 (!) 21 15 16   Temp:    99.7 F (37.6 C)  TempSrc:    Oral  SpO2: 100% 100% 100% 100%  Weight:    75.9 kg  Height:       Eyes: Anicteric no pallor. ENMT: No discharge from the ears eyes nose or mouth. Neck: No mass felt.  No neck rigidity. Respiratory: No rhonchi or crepitations. Cardiovascular: S1-S2 heard. Abdomen: Soft nontender bowel sounds present. Musculoskeletal: No edema.  No joint effusion. Skin: No rash. Neurologic: Alert awake oriented to time place and person.  Moves all extremities. Psychiatric: Appears normal with normal affect.   Labs on Admission: I have personally reviewed following labs and imaging studies  CBC: Recent Labs  Lab 09/16/19 0229  WBC 5.7  HGB 4.1*  HCT 13.8*  MCV 116.9*  PLT 762   Basic Metabolic Panel: Recent Labs  Lab 09/16/19 0111  NA 138  K 4.3  CL 95*  CO2 29  GLUCOSE 218*  BUN 20  CREATININE 3.64*  CALCIUM 8.7*   GFR: Estimated Creatinine Clearance: 17.8 mL/min (A) (by C-G formula based on SCr of 3.64 mg/dL (H)). Liver Function Tests: Recent Labs  Lab 09/16/19 0111  AST 46*  ALT 35  ALKPHOS 76  BILITOT 0.4  PROT 5.7*  ALBUMIN 2.7*   No results for input(s): LIPASE, AMYLASE in the last 168 hours. No results for input(s): AMMONIA in the last 168 hours. Coagulation Profile: Recent Labs  Lab 09/16/19 0111  INR 1.3*   Cardiac Enzymes: No results for input(s): CKTOTAL, CKMB, CKMBINDEX, TROPONINI in the last 168 hours. BNP (last 3 results) No results for input(s): PROBNP in the last 8760 hours. HbA1C: No results for input(s): HGBA1C in the last 72 hours. CBG: No results for input(s): GLUCAP in the last 168 hours. Lipid Profile: No results for input(s): CHOL, HDL, LDLCALC, TRIG, CHOLHDL, LDLDIRECT in the last 72  hours. Thyroid Function Tests: No results for input(s): TSH,  T4TOTAL, FREET4, T3FREE, THYROIDAB in the last 72 hours. Anemia Panel: No results for input(s): VITAMINB12, FOLATE, FERRITIN, TIBC, IRON, RETICCTPCT in the last 72 hours. Urine analysis:    Component Value Date/Time   COLORURINE YELLOW 02/25/2017 1224   APPEARANCEUR HAZY (A) 02/25/2017 1224   LABSPEC 1.013 02/25/2017 1224   PHURINE 8.0 02/25/2017 1224   GLUCOSEU >=500 (A) 02/25/2017 1224   HGBUR SMALL (A) 02/25/2017 1224   BILIRUBINUR NEGATIVE 02/25/2017 1224   BILIRUBINUR neg 12/13/2014 1127   KETONESUR NEGATIVE 02/25/2017 1224   PROTEINUR >=300 (A) 02/25/2017 1224   UROBILINOGEN 0.2 05/01/2015 1252   NITRITE NEGATIVE 02/25/2017 1224   LEUKOCYTESUR NEGATIVE 02/25/2017 1224   Sepsis Labs: @LABRCNTIP (procalcitonin:4,lacticidven:4) ) Recent Results (from the past 240 hour(s))  Respiratory Panel by RT PCR (Flu A&B, Covid) - Nasopharyngeal Swab     Status: None   Collection Time: 09/16/19  1:25 AM   Specimen: Nasopharyngeal Swab  Result Value Ref Range Status   SARS Coronavirus 2 by RT PCR NEGATIVE NEGATIVE Final    Comment: (NOTE) SARS-CoV-2 target nucleic acids are NOT DETECTED. The SARS-CoV-2 RNA is generally detectable in upper respiratoy specimens during the acute phase of infection. The lowest concentration of SARS-CoV-2 viral copies this assay can detect is 131 copies/mL. A negative result does not preclude SARS-Cov-2 infection and should not be used as the sole basis for treatment or other patient management decisions. A negative result may occur with  improper specimen collection/handling, submission of specimen other than nasopharyngeal swab, presence of viral mutation(s) within the areas targeted by this assay, and inadequate number of viral copies (<131 copies/mL). A negative result must be combined with clinical observations, patient history, and epidemiological information. The expected result is Negative. Fact Sheet for Patients:    PinkCheek.be Fact Sheet for Healthcare Providers:  GravelBags.it This test is not yet ap proved or cleared by the [place] FDA and  has been authorized for detection and/or diagnosis of SARS-CoV-2 by FDA under an Emergency Use Authorization (EUA). This EUA will remain  in effect (meaning this test can be used) for the duration of the COVID-19 declaration under Section 564(b)(1) of the Act, 21 U.S.C. section 360bbb-3(b)(1), unless the authorization is terminated or revoked sooner.    Influenza A by PCR NEGATIVE NEGATIVE Final   Influenza B by PCR NEGATIVE NEGATIVE Final    Comment: (NOTE) The Xpert Xpress SARS-CoV-2/FLU/RSV assay is intended as an aid in  the diagnosis of influenza from Nasopharyngeal swab specimens and  should not be used as a sole basis for treatment. Nasal washings and  aspirates are unacceptable for Xpert Xpress SARS-CoV-2/FLU/RSV  testing. Fact Sheet for Patients: PinkCheek.be Fact Sheet for Healthcare Providers: GravelBags.it This test is not yet approved or cleared by the [place] FDA and  has been authorized for detection and/or diagnosis of SARS-CoV-2 by  FDA under an Emergency Use Authorization (EUA). This EUA will remain  in effect (meaning this test can be used) for the duration of the  Covid-19 declaration under Section 564(b)(1) of the Act, 21  U.S.C. section 360bbb-3(b)(1), unless the authorization is  terminated or revoked. Performed at [facility], [place] [address] [place], [place] [address]      Radiological Exams on Admission: No results found.  EKG: Independently reviewed.  Normal sinus rhythm with prolonged QTC.  Assessment/Plan Active Problems:   External hemorrhoids   Acute blood loss anemia   Acute  GI bleeding    1. Acute GI bleed with history of recurrent GI bleed requiring angiogram and coil  embolization in December 2020 and had angiodysplasia seen in the colonoscopy done in January last month requiring ablation presents with recurrent episodes of bleeding.  Hemoglobin is around 4 we will keep patient n.p.o. transfuse 2 units of PRBC presently hemodynamically stable.  Consult about GI. 2. Acute blood loss anemia follow CBC.  Patient has history of being Jehovah's Witness but agrees for transfusion.  Follow CBC after transfusion. 3. Diabetes mellitus type 2 we will keep patient on sliding scale coverage. 4. Hypertension we will keep patient on as needed IV labetalol as needed. 5. ESRD on hemodialysis consult nephrology for dialysis does not look fluid overloaded at this time. 6. Prolonged QTC -avoid QTC prolonging medications.  Given the acute GI bleed with severe anemia from blood loss with ongoing bleed will need close monitoring and inpatient status.   DVT prophylaxis: SCDs due to acute GI bleed. Code Status: Full code. Family Communication: Discussed with patient. Disposition Plan: Home. Consults called: None. Admission status: Inpatient.   Rise Patience MD [facility] Pager [phone].  If 7PM-7AM, please contact night-coverage [email or web address] Password [email or web address]  09/16/2019, 5:42 AM

## 2019-09-16 NOTE — ED Triage Notes (Signed)
Patient bleeding rectally for 7 days.  Patient went to dialysis and hemoglobin was low and they told her to come to ER.  She went home and took a nap first.  Patient is SOB and fatigued.

## 2019-09-16 NOTE — Progress Notes (Addendum)
[facility] Renal Consultation Note  Indication for Consultation:  Management of ESRD/hemodialysis; anemia, hypertension/volume and secondary hyperparathyroidism  HPI: [name] is a 54 y.o. female started HD( MWF)  05/2015 ,  Jehovah's Witness(but has accepted RBCS )  - Bipolar v. Shizoaffective disorder, HTN, DM, Hep C, recurrent anemia, chronic pain ,Lower gi bld HO  AVMS, Hemorrhoids,gout,  Now admitted with hgb 4.1and hematochezia with lower abdominal discomfort. Last HD  Yesterday  On schedule . We are consulked for HD/ esrd issues  She denies any fevers, cp , cough, problems with HD.      Past Medical History:  Diagnosis Date  . Anemia   . Anxiety   . Bipolar disorder, unspecified ([place]) 02/06/2014  . Chronic combined systolic and diastolic CHF (congestive heart failure) (HCC)    EF 40% by echo and 48% by stress test  . Chronic pain syndrome   . Depression   . ESRD on hemodialysis (HCC)    TTS  . GERD (gastroesophageal reflux disease)   . Glaucoma   . Headache    sinus headaches  . Hepatitis C   . High cholesterol   . History of hiatal hernia   . History of vitamin D deficiency 06/20/2015  . Hypertension   . MRSA infection ?2014   "on the back of my head; spread throughout my bloodstream"  . Neuropathy   . Rectal bleeding 09/16/2019  . Refusal of blood transfusions as patient is Jehovah's Witness    occasionally allows transfusions.    . Schizoaffective disorder, unspecified condition 02/08/2014  . Seizure [facility]) dx'd 2014   "don't know what kind; last one was ~ 04/2014"  . TIA (transient ischemic attack) 2010  . Type II diabetes mellitus ([place])    INSULIN DEPENDENT    Past Surgical History:  Procedure Laterality Date  . ABDOMINAL HYSTERECTOMY  2014  . ABDOMINAL HYSTERECTOMY  2010  . AMPUTATION Left 05/21/2013   Procedure: Left Midfoot AMPUTATION;  Surgeon: Newt Minion, MD;  Location: [place];  Service: Orthopedics;  Laterality: Left;  Left Midfoot  Amputation  . AV FISTULA PLACEMENT Left 05/04/2015   Procedure: ARTERIOVENOUS (AV) GRAFT INSERTION;  Surgeon: Angelia Mould, MD;  Location: [place];  Service: Vascular;  Laterality: Left;  . BIOPSY  11/18/2018   Procedure: BIOPSY;  Surgeon: Jerene Bears, MD;  Location: [facility] ENDOSCOPY;  Service: Gastroenterology;;  . COLONOSCOPY WITH PROPOFOL N/A 06/22/2013   Procedure: COLONOSCOPY WITH PROPOFOL;  Surgeon: Jeryl Columbia, MD;  Location: [facility] ENDOSCOPY;  Service: Endoscopy;  Laterality: N/A;  . COLONOSCOPY WITH PROPOFOL N/A 11/18/2018   Procedure: COLONOSCOPY WITH PROPOFOL;  Surgeon: Jerene Bears, MD;  Location: [place];  Service: Gastroenterology;  Laterality: N/A;  . COLONOSCOPY WITH PROPOFOL N/A 08/18/2019   Procedure: COLONOSCOPY WITH PROPOFOL;  Surgeon: Jerene Bears, MD;  Location: [place];  Service: Gastroenterology;  Laterality: N/A;  . ENTEROSCOPY N/A 12/31/2018   Procedure: ENTEROSCOPY;  Surgeon: Irene Shipper, MD;  Location: [facility] ENDOSCOPY;  Service: Endoscopy;  Laterality: N/A;  Push enteroscopy at 8am (spoke with Endo)  . ESOPHAGOGASTRODUODENOSCOPY N/A 05/11/2015   Procedure: ESOPHAGOGASTRODUODENOSCOPY (EGD);  Surgeon: Laurence Spates, MD;  Location: [facility] ENDOSCOPY;  Service: Endoscopy;  Laterality: N/A;  . ESOPHAGOGASTRODUODENOSCOPY (EGD) WITH PROPOFOL N/A 11/18/2018   Procedure: ESOPHAGOGASTRODUODENOSCOPY (EGD) WITH PROPOFOL;  Surgeon: Jerene Bears, MD;  Location: [facility] ENDOSCOPY;  Service: Gastroenterology;  Laterality: N/A;  . ESOPHAGOGASTRODUODENOSCOPY (EGD) WITH PROPOFOL N/A 05/15/2019   Procedure: ESOPHAGOGASTRODUODENOSCOPY (  EGD) WITH PROPOFOL;  Surgeon: Jackquline Denmark, MD;  Location: [facility] ENDOSCOPY;  Service: Endoscopy;  Laterality: N/A;  . ESOPHAGOGASTRODUODENOSCOPY (EGD) WITH PROPOFOL N/A 08/18/2019   Procedure: ESOPHAGOGASTRODUODENOSCOPY (EGD) WITH PROPOFOL;  Surgeon: Jerene Bears, MD;  Location: [facility] ENDOSCOPY;  Service: Gastroenterology;  Laterality: N/A;  . EYE SURGERY Bilateral  2010   Lasik  . FLEXIBLE SIGMOIDOSCOPY N/A 01/17/2017   Procedure: FLEXIBLE SIGMOIDOSCOPY w/ hemorrhoid banding possible;  Surgeon: Gatha Mayer, MD;  Location: [facility] ENDOSCOPY;  Service: Endoscopy;  Laterality: N/A;  . FLEXIBLE SIGMOIDOSCOPY N/A 01/21/2019   Procedure: FLEXIBLE SIGMOIDOSCOPY;  Surgeon: Ronald Lobo, MD;  Location: [facility] ENDOSCOPY;  Service: Endoscopy;  Laterality: N/A;  Plan to do the test unprepped, unsedated  . FOOT AMPUTATION Left    due diabetic neuropathy, could not feel ulcer on bottom of foot  . GIVENS CAPSULE STUDY N/A 01/21/2019   Procedure: GIVENS CAPSULE STUDY;  Surgeon: Ronald Lobo, MD;  Location: [facility] ENDOSCOPY;  Service: Endoscopy;  Laterality: N/A;  . HOT HEMOSTASIS N/A 08/18/2019   Procedure: HOT HEMOSTASIS (ARGON PLASMA COAGULATION/BICAP);  Surgeon: Jerene Bears, MD;  Location: [facility] ENDOSCOPY;  Service: Gastroenterology;  Laterality: N/A;  . IR ANGIOGRAM SELECTIVE EACH ADDITIONAL VESSEL  07/13/2019  . IR ANGIOGRAM SELECTIVE EACH ADDITIONAL VESSEL  07/13/2019  . IR ANGIOGRAM SELECTIVE EACH ADDITIONAL VESSEL  07/13/2019  . IR ANGIOGRAM SELECTIVE EACH ADDITIONAL VESSEL  07/13/2019  . IR ANGIOGRAM VISCERAL SELECTIVE  07/13/2019  . IR EMBO ART  VEN HEMORR LYMPH EXTRAV  INC GUIDE ROADMAPPING  07/13/2019  . POLYPECTOMY  11/18/2018   Procedure: POLYPECTOMY;  Surgeon: Jerene Bears, MD;  Location: [facility] ENDOSCOPY;  Service: Gastroenterology;;  . REFRACTIVE SURGERY Bilateral 2013  . TONSILLECTOMY  1970's  . TUBAL LIGATION  2000      Family History  Problem Relation Age of Onset  . Hypertension Mother   . Diabetes Mother   . Hypertension Father   . Diabetes Father       reports that she has been smoking cigarettes. She has a 36.00 pack-year smoking history. She has never used smokeless tobacco. She reports previous alcohol use of about 3.0 standard drinks of alcohol per week. She reports previous drug use. Drugs: "Crack" cocaine and Cocaine.   Allergies  Allergen  Reactions  . Penicillins Hives and Other (See Comments)    Has patient had a PCN reaction causing immediate rash, facial/tongue/throat swelling, SOB or lightheadedness with hypotension: Yes Has patient had a PCN reaction causing severe rash involving mucus membranes or skin necrosis: No Has patient had a PCN reaction that required hospitalization No Has patient had a PCN reaction occurring within the last 10 years: No If all of the above answers are "NO", then may proceed with Cephalosporin use. Pt tolerates cefepime and ceftriaxone  . Gabapentin Itching and Swelling  . Lyrica [Pregabalin] Swelling and Other (See Comments)    Facial and leg swelling  . Saphris [Asenapine] Swelling and Other (See Comments)    Facial swelling  . Tramadol Swelling and Other (See Comments)    Leg swelling  . Adhesive [Tape] Itching    SEVERE ITCHING  . Latex Itching    SEVERE ITCHING  . Other Other (See Comments)    NO BLOOD PRODUCTS; PATIENT IS A JEHOVAH'S WITNESS    Prior to Admission medications   Medication Sig Start Date End Date Taking? Authorizing Provider  ALPRAZolam Duanne Moron) 1 MG tablet Take 1 mg by mouth 3 (three) times daily as needed for  anxiety. 08/20/19  Yes [provider]  amphetamine-dextroamphetamine (ADDERALL XR) 10 MG 24 hr capsule Take 10 mg by mouth every morning. 08/20/19  Yes [provider]  citalopram (CELEXA) 40 MG tablet Take 40 mg by mouth daily. 08/20/19  Yes [provider]  DULoxetine (CYMBALTA) 30 MG capsule Take 30 mg by mouth at bedtime. 08/25/19  Yes [provider]  labetalol (NORMODYNE) 200 MG tablet Take 200 mg by mouth 2 (two) times daily. 08/20/19  Yes [provider]  acetaminophen (TYLENOL) 325 MG tablet Take 2 tablets (650 mg total) by mouth every 6 (six) hours as needed for fever, headache or moderate pain. 02/26/19   Roxan Hockey, MD  albuterol (PROVENTIL) (2.5 MG/3ML) 0.083% nebulizer solution Take 6 mLs (5 mg total)  by nebulization daily as needed for wheezing or shortness of breath. 08/19/19   Antonieta Pert, MD  albuterol (VENTOLIN HFA) 108 (90 Base) MCG/ACT inhaler Inhale 2 puffs into the lungs every 4 (four) hours as needed for wheezing or shortness of breath. 01/26/19   Dahal, Marlowe Aschoff, MD  allopurinol (ZYLOPRIM) 100 MG tablet Take 2 tablets (200 mg total) by mouth daily. 02/26/19 08/15/28  Roxan Hockey, MD  amLODipine (NORVASC) 10 MG tablet Take 1 tablet (10 mg total) by mouth daily. 07/16/19   Pokhrel, Corrie Mckusick, MD  atorvastatin (LIPITOR) 40 MG tablet Take 1 tablet (40 mg total) by mouth at bedtime. 02/26/19   Roxan Hockey, MD  Darbepoetin Alfa (ARANESP) 200 MCG/0.4ML SOSY injection Inject 0.4 mLs (200 mcg total) into the vein every Wednesday with hemodialysis. 01/27/19   Terrilee Croak, MD  diphenhydrAMINE (BENADRYL) 25 mg capsule Take 1 capsule (25 mg total) by mouth every 6 (six) hours as needed for up to 8 days for itching. Patient not taking: Reported on 09/16/2019 01/26/19 02/25/28  Terrilee Croak, MD  furosemide (LASIX) 80 MG tablet Take 80 mg by mouth 2 (two) times daily.    [provider]  hydrALAZINE (APRESOLINE) 25 MG tablet Take 25 mg by mouth 3 (three) times daily. 08/04/19   [provider]  hydrocortisone (ANUSOL-HC) 2.5 % rectal cream Place 1 application rectally 2 (two) times daily. 04/03/19   Danford, Suann Larry, MD  hydrOXYzine (ATARAX/VISTARIL) 25 MG tablet Take 1 tablet (25 mg total) by mouth 3 (three) times daily as needed for itching or anxiety. 02/26/19   Roxan Hockey, MD  insulin aspart (NOVOLOG) 100 UNIT/ML injection 0-9 Units, Subcutaneous, 3 times daily with meals CBG < 70: implement hypoglycemia protocol CBG 70 - 120: 0 units CBG 121 - 150: 1 unit CBG 151 - 200: 2 units CBG 201 - 250: 3 units CBG 251 - 300: 5 units CBG 301 - 350: 7 units CBG 351 - 400: 9 units CBG > 400: call MD Patient taking differently: Inject 0-9 Units into the skin 3 (three) times daily with  meals. 0-9 Units, Subcutaneous, 3 times daily with meals CBG < 70: implement hypoglycemia protocol CBG 70 - 120: 0 units CBG 121 - 150: 1 unit CBG 151 - 200: 2 units CBG 201 - 250: 3 units CBG 251 - 300: 5 units CBG 301 - 350: 7 units CBG 351 - 400: 9 units CBG > 400: call MD 01/26/19   Terrilee Croak, MD  insulin glargine (LANTUS) 100 UNIT/ML injection Inject 24 Units into the skin at bedtime.    [provider]  lidocaine-prilocaine (EMLA) cream Apply 1 application topically See admin instructions. On dialysis days 12/23/18   [provider]  methocarbamol (ROBAXIN) 500 MG tablet Take 500 mg by mouth daily as needed for muscle spasms.    [provider]  mupirocin ointment (BACTROBAN) 2 % Apply 1 application topically as needed (on arm). 04/03/19   Danford, Suann Larry, MD  oxyCODONE (ROXICODONE) 15 MG immediate release tablet Take 15 mg by mouth every 4 (four) hours as needed for pain.  08/05/19   [provider]  oxymetazoline (AFRIN) 0.05 % nasal spray Place 2 sprays into both nostrils 2 (two) times daily. 08/19/19 09/18/19  Antonieta Pert, MD  pantoprazole (PROTONIX) 40 MG tablet Take 1 tablet (40 mg total) by mouth daily at 6 (six) AM. 08/19/19 09/18/19  Antonieta Pert, MD  Pramoxine-Menthol-Petrolatum (SARNA ULTRA EX) Apply 1 application topically daily as needed (itch).    [provider]  senna-docusate (SENOKOT-S) 8.6-50 MG tablet Take 2 tablets by mouth at bedtime. 02/26/19 02/26/20  Roxan Hockey, MD     Anti-infectives (From admission, onward)   None      Results for orders placed or performed during the hospital encounter of 09/16/19 (from the past 48 hour(s))  Type and screen [place]     Status: None (Preliminary result)   Collection Time: 09/16/19  1:10 AM  Result Value Ref Range   ABO/RH(D) O POS    Antibody Screen NEG    Sample Expiration 09/19/2019,2359    Unit Number [id number]    Blood Component Type RED CELLS,LR     Unit division 00    Status of Unit ISSUED    Transfusion Status OK TO TRANSFUSE    Crossmatch Result Compatible    Unit Number [id number]    Blood Component Type RED CELLS,LR    Unit division 00    Status of Unit ISSUED    Transfusion Status OK TO TRANSFUSE    Crossmatch Result      Compatible Performed at [facility], [address], [place], [place] [address]   Comprehensive metabolic panel     Status: Abnormal   Collection Time: 09/16/19  1:11 AM  Result Value Ref Range   Sodium 138 135 - 145 mmol/L   Potassium 4.3 3.5 - 5.1 mmol/L   Chloride 95 (L) 98 - 111 mmol/L   CO2 29 22 - 32 mmol/L   Glucose, Bld 218 (H) 70 - 99 mg/dL   BUN 20 6 - 20 mg/dL   Creatinine, Ser 3.64 (H) 0.44 - 1.00 mg/dL   Calcium 8.7 (L) 8.9 - 10.3 mg/dL   Total Protein 5.7 (L) 6.5 - 8.1 g/dL   Albumin 2.7 (L) 3.5 - 5.0 g/dL   AST 46 (H) 15 - 41 U/L   ALT 35 0 - 44 U/L   Alkaline Phosphatase 76 38 - 126 U/L   Total Bilirubin 0.4 0.3 - 1.2 mg/dL   GFR calc non Af Amer 13 (L) >60 mL/min   GFR calc Af Amer 16 (L) >60 mL/min   Anion gap 14 5 - 15    Comment: Performed at [facility], [place] [address] [place], [place] [address]  Protime-INR - (order if Patient is taking Coumadin / Warfarin)     Status: Abnormal   Collection Time: 09/16/19  1:11 AM  Result Value Ref Range   Prothrombin Time 16.0 (H) 11.4 - 15.2 seconds   INR 1.3 (H) 0.8 - 1.2    Comment: (NOTE) INR goal varies based on device and disease states. Performed at [facility], [address] [place], [place] [address]   Respiratory Panel by RT PCR (Flu A&B, Covid) - Nasopharyngeal Swab     Status: None   Collection Time: 09/16/19  1:25 AM   Specimen: Nasopharyngeal Swab  Result Value Ref Range   SARS Coronavirus 2 by RT PCR NEGATIVE NEGATIVE    Comment: (NOTE) SARS-CoV-2 target nucleic acids are NOT DETECTED. The SARS-CoV-2 RNA is generally detectable in upper respiratoy specimens during the acute phase of  infection. The lowest concentration of SARS-CoV-2 viral copies this assay can detect is 131 copies/mL. A negative result does not preclude SARS-Cov-2 infection and should not be used as the sole basis for treatment or other patient management decisions. A negative result may occur with  improper specimen collection/handling, submission of specimen other than nasopharyngeal swab, presence of viral mutation(s) within the areas targeted by this assay, and inadequate number of viral copies (<131 copies/mL). A negative result must be combined with clinical observations, patient history, and epidemiological information. The expected result is Negative. Fact Sheet for Patients:  PinkCheek.be Fact Sheet for Healthcare Providers:  GravelBags.it This test is not yet ap proved or cleared by the [place] FDA and  has been authorized for detection and/or diagnosis of SARS-CoV-2 by FDA under an Emergency Use Authorization (EUA). This EUA will remain  in effect (meaning this test can be used) for the duration of the COVID-19 declaration under Section 564(b)(1) of the Act, 21 U.S.C. section 360bbb-3(b)(1), unless the authorization is terminated or revoked sooner.    Influenza A by PCR NEGATIVE NEGATIVE   Influenza B by PCR NEGATIVE NEGATIVE    Comment: (NOTE) The Xpert Xpress SARS-CoV-2/FLU/RSV assay is intended as an aid in  the diagnosis of influenza from Nasopharyngeal swab specimens and  should not be used as a sole basis for treatment. Nasal washings and  aspirates are unacceptable for Xpert Xpress SARS-CoV-2/FLU/RSV  testing. Fact Sheet for Patients: PinkCheek.be Fact Sheet for Healthcare Providers: GravelBags.it This test is not yet approved or cleared by the [place] FDA and  has been authorized for detection and/or diagnosis of SARS-CoV-2 by  FDA under an Emergency  Use Authorization (EUA). This EUA will remain  in effect (meaning this test can be used) for the duration of the  Covid-19 declaration under Section 564(b)(1) of the Act, 21  U.S.C. section 360bbb-3(b)(1), unless the authorization is  terminated or revoked. Performed at [facility], [place] [address], [place], [place] [address]   POC occult blood, ED     Status: Abnormal   Collection Time: 09/16/19  1:35 AM  Result Value Ref Range   Fecal Occult Bld POSITIVE (A) NEGATIVE  Prepare RBC     Status: None   Collection Time: 09/16/19  1:40 AM  Result Value Ref Range   Order Confirmation      ORDER PROCESSED BY BLOOD BANK Performed at [facility], [place] [address], [place], [place] [address]   CBC     Status: Abnormal   Collection Time: 09/16/19  2:29 AM  Result Value Ref Range   WBC 5.7 4.0 - 10.5 K/uL   RBC 1.18 (L) 3.87 - 5.11 MIL/uL   Hemoglobin 4.1 (LL) 12.0 - 15.0 g/dL    Comment: REPEATED TO VERIFY THIS CRITICAL RESULT HAS VERIFIED AND BEEN CALLED TO DAVIS A RN BY SAINVIL SAINVILUS ON 02 11 2021 AT 0320, AND HAS BEEN READ BACK.     HCT 13.8 (L) 36.0 - 46.0 %  MCV 116.9 (H) 80.0 - 100.0 fL   MCH 34.7 (H) 26.0 - 34.0 pg   MCHC 29.7 (L) 30.0 - 36.0 g/dL   RDW 21.7 (H) 11.5 - 15.5 %   Platelets 194 150 - 400 K/uL    Comment: REPEATED TO VERIFY   nRBC 3.7 (H) 0.0 - 0.2 %    Comment: Performed at [facility] [address] [place], [place] [address]  Glucose, capillary     Status: Abnormal   Collection Time: 09/16/19 12:16 PM  Result Value Ref Range   Glucose-Capillary 160 (H) 70 - 99 mg/dL    ROS: see hpi  Physical Exam: Vitals:   09/16/19 1106 09/16/19 1345  BP: (!) 160/60 (!) 185/81  Pulse:  66  Resp: 18 16  Temp: 98.6 F (37 C) 98.9 F (37.2 C)  SpO2: 99% 98%     General: alert,NAD AAF in bed wd, wn HEENT: [place], not icteric, eomi, MM dry Neck: supple,no jvd Heart: RRR no m, r,g Lungs: CTA ,unlabored  Abdomen: BS pos , NT, ND, No ascites or  masses  Extremities: no pedal edema, L transmet amp site healed  Skin: no overt rash or pedelulcers Neuro:OX# no acute focal defcifts  Dialysis Access:  L FA  AVGG + bruit   Dialysis: [facility] MWF   3.5h  71.5kg  2/2 bath  Hep none LFA AVG   Calcitriol 3.0 mcg po /HD  Mircera  200 mcg q 2wks  Last on 09/13/19 Venofer 50mg  qwk    Assessment/Plan 1. GI Bleed (lower  With HGB 4.1  Transfuse PRBCs  She agrees to ,GI to see  2. ESRD -  HD , MWF  , k 4.3  No hd issues today , NO HEP HD   3. Hypertension/volume  - bp stable /no excess vol on exam  Home bp meds = npo now  prn iv med Hydralazine  25 mg  Tid / amlodipine 10 mg / (Lasix 80ng  Bid FU UOP may be able to dc in ESRD Pt )   Fu bp in gi bld  And taper down as needed  4. Anemia  Of GI bld and ESRD= esa (just given 09/13/19) and venofer wed  on hd  5. Metabolic bone disease -  Phos 7.1  Binders when eating Lorin Picket) Vit d on hd  6. DM type 2 = per admit   Ernest Haber, PA-C [place] [phone] 09/16/2019, 2:06 PM   Pt seen, examined and agree w A/P as above.  Kelly Splinter  MD 09/16/2019, 3:15 PM

## 2019-09-16 NOTE — ED Provider Notes (Signed)
[place] EMERGENCY DEPARTMENT Provider Note   CSN: [id number] Arrival date & time: 09/16/19  0101     History Chief Complaint  Patient presents with  . Rectal Bleeding    [name] is a 54 y.o. female.  The history is provided by the patient.  Rectal Bleeding Quality:  Black and tarry Amount:  Copious Duration:  5 days Timing:  Intermittent Chronicity:  Recurrent Context: not constipation, not hemorrhoids and not rectal injury   Similar prior episodes: yes   Relieved by:  Nothing Worsened by:  Nothing Ineffective treatments:  None tried Associated symptoms: light-headedness   Associated symptoms: no abdominal pain, no dizziness, no epistaxis, no fever, no loss of consciousness and no vomiting   Risk factors: no anticoagulant use        Past Medical History:  Diagnosis Date  . Anemia   . Anxiety   . Bipolar disorder, unspecified ([place]) 02/06/2014  . Chronic combined systolic and diastolic CHF (congestive heart failure) (HCC)    EF 40% by echo and 48% by stress test  . Chronic pain syndrome   . Depression   . ESRD on hemodialysis (HCC)    TTS  . GERD (gastroesophageal reflux disease)   . Glaucoma   . Headache    sinus headaches  . Hepatitis C   . High cholesterol   . History of hiatal hernia   . History of vitamin D deficiency 06/20/2015  . Hypertension   . MRSA infection ?2014   "on the back of my head; spread throughout my bloodstream"  . Neuropathy   . Refusal of blood transfusions as patient is Jehovah's Witness    occasionally allows transfusions.    . Schizoaffective disorder, unspecified condition 02/08/2014  . Seizure [facility]) dx'd 2014   "don't know what kind; last one was ~ 04/2014"  . TIA (transient ischemic attack) 2010  . Type II diabetes mellitus ([place])    INSULIN DEPENDENT    Patient Active Problem List   Diagnosis Date Noted  . Angiodysplasia of colon   . Acute GI bleeding 05/13/2019  . DKA (diabetic ketoacidoses)  ([place]) 05/13/2019  . Symptomatic anemia 04/01/2019  . Type II diabetes mellitus with renal manifestations ([place]) 02/25/2019  . TIA (transient ischemic attack) 02/25/2019  . GERD (gastroesophageal reflux disease) 02/25/2019  . Tobacco abuse 02/25/2019  . Fluid overload 02/25/2019  . Uremia 02/25/2019  . Melena   . Rectal bleed 12/30/2018  . Gastritis and gastroduodenitis   . Benign neoplasm of ascending colon   . Benign neoplasm of transverse colon   . Benign neoplasm of descending colon   . GI bleeding 11/17/2018  . Acute blood loss anemia   . No blood products   . GI bleed 10/05/2018  . Prolonged QT interval 10/05/2018  . Localized edema due to fluid overload 09/05/2017  . Lower GI bleed   . Evaluation by psychiatric service required 07/17/2017  . Volume overload 06/03/2017  . Right supracondylar humerus fracture, with nonunion, subsequent encounter 05/01/2017  . Malnutrition of moderate degree 02/28/2017  . Leg pain, left 02/25/2017  . Left leg pain 02/25/2017  . External hemorrhoids   . Diverticulosis of colon without hemorrhage   . Goals of care, counseling/discussion   . Palliative care encounter   . Cellulitis of left leg 01/16/2017  . Upper GI bleed 01/16/2017  . UGIB (upper gastrointestinal bleed)   . Anemia due to chronic kidney disease   . Cellulitis of left thigh  01/01/2017  . ESRD (end stage renal disease) ([place]) 01/01/2017  . Cocaine use disorder, severe, dependence ([place]) 08/29/2016  . Hepatitis C 08/29/2016  . Diabetic ulcer of calf ([place]) 08/29/2016  . Diabetic ulcer of toe ([place]) 08/29/2016  . Cerebrovascular accident (CVA) ([place]) 08/29/2016  . MDD (major depressive disorder) 08/28/2016  . Dyslipidemia associated with type 2 diabetes mellitus ([place]) 11/10/2015  . Hyperkalemia 09/17/2015  . History of vitamin D deficiency 06/20/2015  . Long term prescription opiate use 06/20/2015  . Chronic pain syndrome 06/19/2015  . Anemia of chronic kidney failure 05/01/2015   . transmetatarsal amputation of the left foot 12/13/2014  . Unilateral visual loss 11/22/2014  . 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 ([place]) 11/22/2014  . Elevated troponin 11/09/2014  . Essential hypertension 11/09/2014  . Dizziness   . Chronic combined systolic (congestive) and diastolic (congestive) heart failure ([place])   . Chest pain 08/25/2014  . Rectal bleeding 04/23/2014  . Anemia, iron deficiency 04/13/2014  . Noncompliance 02/02/2014  . Hyponatremia 05/19/2013  . Seizure disorder ([place]) 02/09/2013    Past Surgical History:  Procedure Laterality Date  . ABDOMINAL HYSTERECTOMY  2014  . ABDOMINAL HYSTERECTOMY  2010  . AMPUTATION Left 05/21/2013   Procedure: Left Midfoot AMPUTATION;  Surgeon: Newt Minion, MD;  Location: [place];  Service: Orthopedics;  Laterality: Left;  Left Midfoot Amputation  . AV FISTULA PLACEMENT Left 05/04/2015   Procedure: ARTERIOVENOUS (AV) GRAFT INSERTION;  Surgeon: Angelia Mould, MD;  Location: [place];  Service: Vascular;  Laterality: Left;  . BIOPSY  11/18/2018   Procedure: BIOPSY;  Surgeon: Jerene Bears, MD;  Location: [facility] ENDOSCOPY;  Service: Gastroenterology;;  . COLONOSCOPY WITH PROPOFOL N/A 06/22/2013   Procedure: COLONOSCOPY WITH PROPOFOL;  Surgeon: Jeryl Columbia, MD;  Location: [facility] ENDOSCOPY;  Service: Endoscopy;  Laterality: N/A;  . COLONOSCOPY WITH PROPOFOL N/A 11/18/2018   Procedure: COLONOSCOPY WITH PROPOFOL;  Surgeon: Jerene Bears, MD;  Location: [place];  Service: Gastroenterology;  Laterality: N/A;  . COLONOSCOPY WITH PROPOFOL N/A 08/18/2019   Procedure: COLONOSCOPY WITH PROPOFOL;  Surgeon: Jerene Bears, MD;  Location: [place];  Service: Gastroenterology;  Laterality: N/A;  . ENTEROSCOPY N/A 12/31/2018   Procedure: ENTEROSCOPY;  Surgeon: Irene Shipper, MD;  Location: [facility] ENDOSCOPY;  Service: Endoscopy;  Laterality: N/A;  Push enteroscopy at 8am (spoke with Endo)  . ESOPHAGOGASTRODUODENOSCOPY N/A 05/11/2015   Procedure:  ESOPHAGOGASTRODUODENOSCOPY (EGD);  Surgeon: Laurence Spates, MD;  Location: [facility] ENDOSCOPY;  Service: Endoscopy;  Laterality: N/A;  . ESOPHAGOGASTRODUODENOSCOPY (EGD) WITH PROPOFOL N/A 11/18/2018   Procedure: ESOPHAGOGASTRODUODENOSCOPY (EGD) WITH PROPOFOL;  Surgeon: Jerene Bears, MD;  Location: [facility] ENDOSCOPY;  Service: Gastroenterology;  Laterality: N/A;  . ESOPHAGOGASTRODUODENOSCOPY (EGD) WITH PROPOFOL N/A 05/15/2019   Procedure: ESOPHAGOGASTRODUODENOSCOPY (EGD) WITH PROPOFOL;  Surgeon: Jackquline Denmark, MD;  Location: [facility] ENDOSCOPY;  Service: Endoscopy;  Laterality: N/A;  . ESOPHAGOGASTRODUODENOSCOPY (EGD) WITH PROPOFOL N/A 08/18/2019   Procedure: ESOPHAGOGASTRODUODENOSCOPY (EGD) WITH PROPOFOL;  Surgeon: Jerene Bears, MD;  Location: [facility] ENDOSCOPY;  Service: Gastroenterology;  Laterality: N/A;  . EYE SURGERY Bilateral 2010   Lasik  . FLEXIBLE SIGMOIDOSCOPY N/A 01/17/2017   Procedure: FLEXIBLE SIGMOIDOSCOPY w/ hemorrhoid banding possible;  Surgeon: Gatha Mayer, MD;  Location: [facility] ENDOSCOPY;  Service: Endoscopy;  Laterality: N/A;  . FLEXIBLE SIGMOIDOSCOPY N/A 01/21/2019   Procedure: FLEXIBLE SIGMOIDOSCOPY;  Surgeon: Ronald Lobo, MD;  Location[facility] ENDOSCOPY;  Service: Endoscopy;  Laterality: N/A;  Plan to do the test unprepped, unsedated  .  FOOT AMPUTATION Left    due diabetic neuropathy, could not feel ulcer on bottom of foot  . GIVENS CAPSULE STUDY N/A 01/21/2019   Procedure: GIVENS CAPSULE STUDY;  Surgeon: Ronald Lobo, MD;  Location: [facility] ENDOSCOPY;  Service: Endoscopy;  Laterality: N/A;  . HOT HEMOSTASIS N/A 08/18/2019   Procedure: HOT HEMOSTASIS (ARGON PLASMA COAGULATION/BICAP);  Surgeon: Jerene Bears, MD;  Location: [facility] ENDOSCOPY;  Service: Gastroenterology;  Laterality: N/A;  . IR ANGIOGRAM SELECTIVE EACH ADDITIONAL VESSEL  07/13/2019  . IR ANGIOGRAM SELECTIVE EACH ADDITIONAL VESSEL  07/13/2019  . IR ANGIOGRAM SELECTIVE EACH ADDITIONAL VESSEL  07/13/2019  . IR ANGIOGRAM SELECTIVE EACH ADDITIONAL  VESSEL  07/13/2019  . IR ANGIOGRAM VISCERAL SELECTIVE  07/13/2019  . IR EMBO ART  VEN HEMORR LYMPH EXTRAV  INC GUIDE ROADMAPPING  07/13/2019  . POLYPECTOMY  11/18/2018   Procedure: POLYPECTOMY;  Surgeon: Jerene Bears, MD;  Location: [facility] ENDOSCOPY;  Service: Gastroenterology;;  . REFRACTIVE SURGERY Bilateral 2013  . TONSILLECTOMY  1970's  . TUBAL LIGATION  2000     OB History    Gravida  0   Para  0   Term  0   Preterm  0   AB  0   Living        SAB  0   TAB  0   Ectopic  0   Multiple      Live Births              Family History  Problem Relation Age of Onset  . Hypertension Mother   . Diabetes Mother   . Hypertension Father   . Diabetes Father     Social History   Tobacco Use  . Smoking status: Current Some Day Smoker    Packs/day: 1.00    Years: 36.00    Pack years: 36.00    Types: Cigarettes  . Smokeless tobacco: Never Used  . Tobacco comment: 5 cigs in one setting  Substance Use Topics  . Alcohol use: Not Currently    Alcohol/week: 3.0 standard drinks    Types: 3 Standard drinks or equivalent per week  . Drug use: Not Currently    Types: "Crack" cocaine, Cocaine    Comment: last use >2 years ago    Home Medications Prior to Admission medications   Medication Sig Start Date End Date Taking? Authorizing Provider  acetaminophen (TYLENOL) 325 MG tablet Take 2 tablets (650 mg total) by mouth every 6 (six) hours as needed for fever, headache or moderate pain. 02/26/19   Roxan Hockey, MD  albuterol (PROVENTIL) (2.5 MG/3ML) 0.083% nebulizer solution Take 6 mLs (5 mg total) by nebulization daily as needed for wheezing or shortness of breath. 08/19/19   Antonieta Pert, MD  albuterol (VENTOLIN HFA) 108 (90 Base) MCG/ACT inhaler Inhale 2 puffs into the lungs every 4 (four) hours as needed for wheezing or shortness of breath. 01/26/19   Dahal, Marlowe Aschoff, MD  allopurinol (ZYLOPRIM) 100 MG tablet Take 2 tablets (200 mg total) by mouth daily. 02/26/19 08/15/28  Roxan Hockey, MD  amLODipine (NORVASC) 10 MG tablet Take 1 tablet (10 mg total) by mouth daily. 07/16/19   Pokhrel, Corrie Mckusick, MD  atorvastatin (LIPITOR) 40 MG tablet Take 1 tablet (40 mg total) by mouth at bedtime. 02/26/19   Roxan Hockey, MD  Darbepoetin Alfa (ARANESP) 200 MCG/0.4ML SOSY injection Inject 0.4 mLs (200 mcg total) into the vein every Wednesday with hemodialysis. 01/27/19   Terrilee Croak, MD  diphenhydrAMINE (BENADRYL) 25  mg capsule Take 1 capsule (25 mg total) by mouth every 6 (six) hours as needed for up to 8 days for itching. 01/26/19 02/25/28  Dahal, Marlowe Aschoff, MD  furosemide (LASIX) 80 MG tablet Take 80 mg by mouth 2 (two) times daily.    [provider]  hydrALAZINE (APRESOLINE) 25 MG tablet Take 25 mg by mouth 3 (three) times daily. 08/04/19   [provider]  hydrocortisone (ANUSOL-HC) 2.5 % rectal cream Place 1 application rectally 2 (two) times daily. 04/03/19   Danford, Suann Larry, MD  hydrOXYzine (ATARAX/VISTARIL) 25 MG tablet Take 1 tablet (25 mg total) by mouth 3 (three) times daily as needed for itching or anxiety. 02/26/19   Roxan Hockey, MD  insulin aspart (NOVOLOG) 100 UNIT/ML injection 0-9 Units, Subcutaneous, 3 times daily with meals CBG < 70: implement hypoglycemia protocol CBG 70 - 120: 0 units CBG 121 - 150: 1 unit CBG 151 - 200: 2 units CBG 201 - 250: 3 units CBG 251 - 300: 5 units CBG 301 - 350: 7 units CBG 351 - 400: 9 units CBG > 400: call MD Patient taking differently: Inject 0-9 Units into the skin 3 (three) times daily with meals. 0-9 Units, Subcutaneous, 3 times daily with meals CBG < 70: implement hypoglycemia protocol CBG 70 - 120: 0 units CBG 121 - 150: 1 unit CBG 151 - 200: 2 units CBG 201 - 250: 3 units CBG 251 - 300: 5 units CBG 301 - 350: 7 units CBG 351 - 400: 9 units CBG > 400: call MD 01/26/19   Terrilee Croak, MD  insulin glargine (LANTUS) 100 UNIT/ML injection Inject 24 Units into the skin at bedtime.    [provider]    labetalol (NORMODYNE) 100 MG tablet Take 2 tablets (200 mg total) by mouth 2 (two) times daily. 08/19/19 09/18/19  Antonieta Pert, MD  lidocaine-prilocaine (EMLA) cream Apply 1 application topically See admin instructions. On dialysis days 12/23/18   [provider]  methocarbamol (ROBAXIN) 500 MG tablet Take 500 mg by mouth daily as needed for muscle spasms.    [provider]  mupirocin ointment (BACTROBAN) 2 % Apply 1 application topically as needed (on arm). 04/03/19   [place]ford, Suann Larry, MD  oxyCODONE (ROXICODONE) 15 MG immediate release tablet Take 15 mg by mouth every 4 (four) hours as needed for pain.  08/05/19   [provider]  oxymetazoline (AFRIN) 0.05 % nasal spray Place 2 sprays into both nostrils 2 (two) times daily. 08/19/19 09/18/19  Antonieta Pert, MD  pantoprazole (PROTONIX) 40 MG tablet Take 1 tablet (40 mg total) by mouth daily at 6 (six) AM. 08/19/19 09/18/19  Antonieta Pert, MD  Pramoxine-Menthol-Petrolatum (SARNA ULTRA EX) Apply 1 application topically daily as needed (itch).    [provider]  senna-docusate (SENOKOT-S) 8.6-50 MG tablet Take 2 tablets by mouth at bedtime. 02/26/19 02/26/20  Roxan Hockey, MD    Allergies    Penicillins, Gabapentin, Lyrica [pregabalin], Saphris [asenapine], Tramadol, Adhesive [tape], Latex, and Other  Review of Systems   Review of Systems  Constitutional: Negative for fever.  HENT: Negative for nosebleeds.   Eyes: Negative for visual disturbance.  Respiratory: Negative for shortness of breath.   Cardiovascular: Negative for chest pain.  Gastrointestinal: Positive for anal bleeding and hematochezia. Negative for abdominal pain and vomiting.  Genitourinary: Negative for vaginal bleeding.  Musculoskeletal: Negative for arthralgias.  Skin: Negative for rash.  Neurological: Positive for light-headedness. Negative for dizziness and loss of consciousness.  Psychiatric/Behavioral: Negative for agitation.  All other  systems reviewed and are negative.   Physical Exam Updated Vital Signs BP (!) 158/72   Pulse 98   Temp 98.3 F (36.8 C) (Oral)   Resp 13   Ht 5\' 4"  (1.626 m)   Wt 75.8 kg   LMP  (LMP Unknown)   SpO2 98%   BMI 28.67 kg/m   Physical Exam Vitals and nursing note reviewed.  Constitutional:      Appearance: Normal appearance. She is not diaphoretic.  HENT:     Head: Normocephalic and atraumatic.     Nose: Nose normal.  Eyes:     Conjunctiva/sclera: Conjunctivae normal.     Pupils: Pupils are equal, round, and reactive to light.  Cardiovascular:     Rate and Rhythm: Normal rate and regular rhythm.     Pulses: Normal pulses.     Heart sounds: Normal heart sounds.  Pulmonary:     Effort: Pulmonary effort is normal.     Breath sounds: Normal breath sounds.  Abdominal:     General: Abdomen is flat. Bowel sounds are normal. There is no distension.     Palpations: Abdomen is soft.     Tenderness: There is no abdominal tenderness. There is no guarding or rebound.     Hernia: No hernia is present.  Genitourinary:    Rectum: Guaiac result positive.     Comments: Melena on the bed Musculoskeletal:        General: Normal range of motion.     Cervical back: Normal range of motion and neck supple.  Skin:    General: Skin is warm and dry.     Capillary Refill: Capillary refill takes less than 2 seconds.  Neurological:     General: No focal deficit present.     Mental Status: She is alert and oriented to person, place, and time.  Psychiatric:        Mood and Affect: Mood normal.        Behavior: Behavior normal.     ED Results / Procedures / Treatments   Labs (all labs ordered are listed, but only abnormal results are displayed) Results for orders placed or performed during the hospital encounter of 09/16/19  Respiratory Panel by RT PCR (Flu A&B, Covid) - Nasopharyngeal Swab   Specimen: Nasopharyngeal Swab  Result Value Ref Range   SARS Coronavirus 2 by RT PCR NEGATIVE  NEGATIVE   Influenza A by PCR NEGATIVE NEGATIVE   Influenza B by PCR NEGATIVE NEGATIVE  Comprehensive metabolic panel  Result Value Ref Range   Sodium 138 135 - 145 mmol/L   Potassium 4.3 3.5 - 5.1 mmol/L   Chloride 95 (L) 98 - 111 mmol/L   CO2 29 22 - 32 mmol/L   Glucose, Bld 218 (H) 70 - 99 mg/dL   BUN 20 6 - 20 mg/dL   Creatinine, Ser 3.64 (H) 0.44 - 1.00 mg/dL   Calcium 8.7 (L) 8.9 - 10.3 mg/dL   Total Protein 5.7 (L) 6.5 - 8.1 g/dL   Albumin 2.7 (L) 3.5 - 5.0 g/dL   AST 46 (H) 15 - 41 U/L   ALT 35 0 - 44 U/L   Alkaline Phosphatase 76 38 - 126 U/L   Total Bilirubin 0.4 0.3 - 1.2 mg/dL   GFR calc non Af Amer 13 (L) >60 mL/min   GFR calc Af Amer 16 (L) >60 mL/min   Anion gap 14 5 - 15  Protime-INR - (order if  Patient is taking Coumadin / Warfarin)  Result Value Ref Range   Prothrombin Time 16.0 (H) 11.4 - 15.2 seconds   INR 1.3 (H) 0.8 - 1.2  POC occult blood, ED  Result Value Ref Range   Fecal Occult Bld POSITIVE (A) NEGATIVE  Type and screen [place]  Result Value Ref Range   ABO/RH(D) O POS    Antibody Screen NEG    Sample Expiration      09/19/2019,2359 Performed at [facility] [address] [place], [place] [address]    Unit Number [id number]    Blood Component Type RED CELLS,LR    Unit division 00    Status of Unit ALLOCATED    Transfusion Status OK TO TRANSFUSE    Crossmatch Result Compatible    Unit Number [id number]    Blood Component Type RED CELLS,LR    Unit division 00    Status of Unit ALLOCATED    Transfusion Status OK TO TRANSFUSE    Crossmatch Result Compatible   Prepare RBC  Result Value Ref Range   Order Confirmation      ORDER PROCESSED BY BLOOD BANK Performed at [facility], [place] [address], [place], [place] [address]   BPAM [facility]  Result Value Ref Range   Blood Product Unit Number [id number]    PRODUCT CODE H8299B71    Unit Type and Rh 5100    Blood Product Expiration Date 696789381017     Blood Product Unit Number [id number]    PRODUCT CODE M3536R44    Unit Type and Rh 5100    Blood Product Expiration Date 315400867619    No results found.  EKG EKG Interpretation  Date/Time:  Thursday September 16 2019 01:10:28 EST Ventricular Rate:  98 PR Interval:    QRS Duration: 112 QT Interval:  406 QTC Calculation: 519 R Axis:   66 Text Interpretation: Sinus rhythm Borderline intraventricular conduction delay Prolonged QT interval Confirmed by Dory Horn) on 09/16/2019 1:20:07 AM   Radiology No results found.  Procedures Procedures (including critical care time)  Medications Ordered in ED Medications  0.9 %  sodium chloride infusion (Manually program via Guardrails IV Fluids) (has no administration in time range)    ED Course  I have reviewed the triage vital signs and the nursing notes.  Pertinent labs & imaging results that were available during my care of the patient were reviewed by me and considered in my medical decision making (see chart for details).    MDM Reviewed: previous chart, nursing note and vitals Reviewed previous: labs Interpretation: labs and ECG (low hemoblobin) Total time providing critical care: 75-105 minutes (blood ordered HGB < 7). This excludes time spent performing separately reportable procedures and services. Consults: admitting MD  CRITICAL CARE Performed by: Alexio Sroka K Jacole Capley-Rasch Total critical care time: 75 minutes Critical care time was exclusive of separately billable procedures and treating other patients. Critical care was necessary to treat or prevent imminent or life-threatening deterioration. Critical care was time spent personally by me on the following activities: development of treatment plan with patient and/or surrogate as well as nursing, discussions with consultants, evaluation of patient's response to treatment, examination of patient, obtaining history from patient or surrogate, ordering and performing  treatments and interventions, ordering and review of laboratory studies, ordering and review of radiographic studies, pulse oximetry and re-evaluation of patient's condition. Final Clinical Impression(s) / ED Diagnoses Final diagnoses:  Rectal bleeding    Admit to medicine will  likely need repeat procedure in am    Harmonie Verrastro, MD 09/16/19 [id number]

## 2019-09-17 DIAGNOSIS — N186 End stage renal disease: Secondary | ICD-10-CM

## 2019-09-17 DIAGNOSIS — K625 Hemorrhage of anus and rectum: Secondary | ICD-10-CM

## 2019-09-17 DIAGNOSIS — D62 Acute posthemorrhagic anemia: Secondary | ICD-10-CM

## 2019-09-17 DIAGNOSIS — Z992 Dependence on renal dialysis: Secondary | ICD-10-CM

## 2019-09-17 LAB — BASIC METABOLIC PANEL
Anion gap: 13 (ref 5–15)
BUN: 37 mg/dL — ABNORMAL HIGH (ref 6–20)
CO2: 30 mmol/L (ref 22–32)
Calcium: 9.1 mg/dL (ref 8.9–10.3)
Chloride: 93 mmol/L — ABNORMAL LOW (ref 98–111)
Creatinine, Ser: 5.11 mg/dL — ABNORMAL HIGH (ref 0.44–1.00)
GFR calc Af Amer: 10 mL/min — ABNORMAL LOW (ref >60)
GFR calc non Af Amer: 9 mL/min — ABNORMAL LOW (ref >60)
Glucose, Bld: 111 mg/dL — ABNORMAL HIGH (ref 70–99)
Potassium: 4.2 mmol/L (ref 3.5–5.1)
Sodium: 136 mmol/L (ref 135–145)

## 2019-09-17 LAB — CBC WITH DIFFERENTIAL/PLATELET
Abs Immature Granulocytes: 0.05 10*3/uL (ref 0.00–0.07)
Basophils Absolute: 0 10*3/uL (ref 0.0–0.1)
Basophils Relative: 0 %
Eosinophils Absolute: 0.1 10*3/uL (ref 0.0–0.5)
Eosinophils Relative: 2 %
HCT: 22.9 % — ABNORMAL LOW (ref 36.0–46.0)
Hemoglobin: 7.4 g/dL — ABNORMAL LOW (ref 12.0–15.0)
Immature Granulocytes: 1 %
Lymphocytes Relative: 14 %
Lymphs Abs: 1 10*3/uL (ref 0.7–4.0)
MCH: 31.8 pg (ref 26.0–34.0)
MCHC: 32.3 g/dL (ref 30.0–36.0)
MCV: 98.3 fL (ref 80.0–100.0)
Monocytes Absolute: 0.4 10*3/uL (ref 0.1–1.0)
Monocytes Relative: 6 %
Neutro Abs: 5.2 10*3/uL (ref 1.7–7.7)
Neutrophils Relative %: 77 %
Platelets: 148 10*3/uL — ABNORMAL LOW (ref 150–400)
RBC: 2.33 MIL/uL — ABNORMAL LOW (ref 3.87–5.11)
RDW: 21.3 % — ABNORMAL HIGH (ref 11.5–15.5)
WBC: 6.8 10*3/uL (ref 4.0–10.5)
nRBC: 1.8 % — ABNORMAL HIGH (ref 0.0–0.2)

## 2019-09-17 LAB — HEMOGLOBIN AND HEMATOCRIT, BLOOD
HCT: 20.5 % — ABNORMAL LOW (ref 36.0–46.0)
Hemoglobin: 6.6 g/dL — CL (ref 12.0–15.0)

## 2019-09-17 LAB — GLUCOSE, CAPILLARY
Glucose-Capillary: 106 mg/dL — ABNORMAL HIGH (ref 70–99)
Glucose-Capillary: 107 mg/dL — ABNORMAL HIGH (ref 70–99)
Glucose-Capillary: 128 mg/dL — ABNORMAL HIGH (ref 70–99)
Glucose-Capillary: 248 mg/dL — ABNORMAL HIGH (ref 70–99)

## 2019-09-17 LAB — HEPATITIS B SURFACE ANTIGEN: Hepatitis B Surface Ag: NONREACTIVE

## 2019-09-17 MED ORDER — HYDRALAZINE HCL 20 MG/ML IJ SOLN
10.0000 mg | Freq: Once | INTRAMUSCULAR | Status: DC
  Filled 2019-09-17: qty 1

## 2019-09-17 MED ORDER — LABETALOL HCL 100 MG PO TABS
200.0000 mg | ORAL_TABLET | Freq: Two times a day (BID) | ORAL | Status: DC
  Administered 2019-09-17 – 2019-09-19 (×5): 200 mg via ORAL
  Filled 2019-09-17 (×2): qty 1
  Filled 2019-09-17: qty 2
  Filled 2019-09-17 (×2): qty 1

## 2019-09-17 MED ORDER — INSULIN ASPART 100 UNIT/ML ~~LOC~~ SOLN
0.0000 [IU] | Freq: Three times a day (TID) | SUBCUTANEOUS | Status: DC
  Administered 2019-09-17: 3 [IU] via SUBCUTANEOUS
  Administered 2019-09-18: 2 [IU] via SUBCUTANEOUS
  Administered 2019-09-18 (×2): 3 [IU] via SUBCUTANEOUS
  Administered 2019-09-18 – 2019-09-19 (×4): 2 [IU] via SUBCUTANEOUS
  Administered 2019-09-19 – 2019-09-20 (×3): 3 [IU] via SUBCUTANEOUS
  Administered 2019-09-20: 2 [IU] via SUBCUTANEOUS
  Administered 2019-09-21: 3 [IU] via SUBCUTANEOUS

## 2019-09-17 MED ORDER — AMLODIPINE BESYLATE 10 MG PO TABS
10.0000 mg | ORAL_TABLET | Freq: Every day | ORAL | Status: DC
  Administered 2019-09-17 – 2019-09-21 (×5): 10 mg via ORAL
  Filled 2019-09-17 (×5): qty 1

## 2019-09-17 MED ORDER — HYDRALAZINE HCL 25 MG PO TABS
25.0000 mg | ORAL_TABLET | Freq: Three times a day (TID) | ORAL | Status: DC
  Administered 2019-09-17 – 2019-09-18 (×5): 25 mg via ORAL
  Filled 2019-09-17 (×5): qty 1

## 2019-09-17 MED ORDER — FUROSEMIDE 10 MG/ML IJ SOLN
80.0000 mg | Freq: Once | INTRAMUSCULAR | Status: AC
  Administered 2019-09-17: 80 mg via INTRAVENOUS
  Filled 2019-09-17: qty 8

## 2019-09-17 NOTE — Plan of Care (Signed)
  Problem: Education: Goal: Knowledge of General Education information will improve Description Including pain rating scale, medication(s)/side effects and non-pharmacologic comfort measures Outcome: Progressing   

## 2019-09-17 NOTE — Progress Notes (Signed)
Hemodialysis hard copy placed in patient's chart.

## 2019-09-17 NOTE — Progress Notes (Addendum)
     Progress Note    ASSESSMENT AND PLAN:   54 yo female with ESRD, chronic anemia / chronic GI blood loss, DM, HTN, chronic combined systolic and diastolic heart failure, cirrhosis, anxiety,  schizoaffective disorder.  1. Recurrent hematochezia with lower abdominal discomfort. Multiple endoscopic evaluations in the past. Known right sided colonic angioectasias.  --Appropriate response in hgb after 3 units of blood, 4.1 >>>7.4.  --Dialyzing right now. Scared she will bleed after session. Correlates bleeding with dialysis sessions  2. Cirrhosis, doesn't seem to have associated portal HTN.     SUBJECTIVE   No complaints. Lower abdominal discomfort improving on dialysis today. No BMs or bleeding  Review of systems: No chest pain, dyspnea or dysuria  Had dialysis today. OBJECTIVE:     Vital signs in last 24 hours: Temp:  [98.2 F (36.8 C)-99 F (37.2 C)] 99 F (37.2 C) (02/12 0802) Pulse Rate:  [66-106] 98 (02/12 0802) Resp:  [16-19] 16 (02/12 0802) BP: (160-214)/(60-101) 214/101 (02/12 0802) SpO2:  [95 %-100 %] 100 % (02/12 0802) FiO2 (%):  [2 %] 2 % (02/12 0802) Weight:  [73.6 kg] 73.6 kg (02/12 0415)   General:   Alert,  NAD Heart:  Regular rate and rhythm;  No lower extremity edema   Pulm: Normal respiratory effort   Abdomen:  Soft, nondistended, nontender.  Normal bowel sounds.          Psych:  Pleasant, cooperative.  Normal mood and affect.   Intake/Output from previous day: 02/11 0701 - 02/12 0700 In: 1264.5 [Blood:1264.5] Out: -  Intake/Output this shift: No intake/output data recorded.  Lab Results: Recent Labs    09/16/19 0229 09/16/19 2019 09/17/19 0727  WBC 5.7  --  6.8  HGB 4.1* 6.6* 7.4*  HCT 13.8* 20.5* 22.9*  PLT 194  --  148*   BMET Recent Labs    09/16/19 0111 09/17/19 0727  NA 138 136  K 4.3 4.2  CL 95* 93*  CO2 29 30  GLUCOSE 218* 111*  BUN 20 37*  CREATININE 3.64* 5.11*  CALCIUM 8.7* 9.1   LFT Recent Labs     09/16/19 0111  PROT 5.7*  ALBUMIN 2.7*  AST 46*  ALT 35  ALKPHOS 76  BILITOT 0.4   PT/INR Recent Labs    09/16/19 0111  LABPROT 16.0*  INR 1.3*     Active Problems:   External hemorrhoids   Acute blood loss anemia   Acute GI bleeding     LOS: 1 day   Tye Savoy ,NP 09/17/2019, 10:26 AM   I have discussed the case with the PA, and that is the plan I formulated. I personally interviewed and examined the patient. Chief complaint: Hematochezia Acute blood loss anemia on top of anemia chronic kidney disease, improved after transfusion.   No rebleeding for about 24 hours. Most recent colonoscopy with nonbleeding AVM in the right colon, ablated with APC.  There was also noted to be generally friable mucosa in the right colon as well. Longstanding problem for this patient, very difficult to track down clear and consistent source and get control of it.  If recurrent bleeding, tagged RBC scan.  If positive, direct to interventional radiology for angiogram and attempt at embolization.  Regular diet  Nelida Meuse III Office: [phone]

## 2019-09-17 NOTE — Progress Notes (Signed)
Patient ID: [name], female   DOB: 1966-01-20, 54 y.o.   MRN: [id number]  PROGRESS NOTE    FLORINA GLAS  RSW:[id number] DOB: 10/22/1965 DOA: 09/16/2019 PCP: Rocco Serene, MD   Brief Narrative:  54 year old female with history of ESRD on hemodialysis, recurrent GI bleed with multiple recurrent admissions with last admission in January 2021 when she underwent EGD and colonoscopy and had ablation of the angiodysplasia in the colon presented with rectal bleeding.  Hemoglobin was 4.1 admission.  GI was consulted.  Nephrology was also consulted.  She was transfused 2 units packed red cells.  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  Assessment & Plan:   Acute GI bleeding, most likely lower GI bleeding in a patient with history of recurrent GI bleeding and angiodysplasia of colon Acute on chronic blood loss anemia -Patient has had recurrent hospitalization for the same.  Patient has a history of being Jehovah's Witness but agreed to blood transfusion during last hospitalization.  She received 2 units packed red cells on admission.  Hemoglobin was 6.6 last night for which she received 1 more unit of packed red cells.  Hemoglobin 7.4 this morning.  Monitor H&H. -GI following.  If continues to have rectal bleeding, GI recommends bleeding scan.  End-stage renal disease on hemodialysis -Nephrology following.  Dialysis as per nephrology schedule  Diabetes mellitus type 2 with hyperglycemia Continue CBGs with SSI  Hypertension -Blood pressure elevated.  Will start oral meds.  Use IV labetalol as needed.   DVT prophylaxis: SCDs Code Status: Full Family Communication: Spoke to patient at bedside Disposition Plan: Home in 1 to 2 days once bleeding stabilizes and hemoglobin remained stable and cleared by GI.  Consultants: GI/nephrology  Procedures: None  Antimicrobials: None   Subjective: Patient seen and examined at  bedside undergoing dialysis.  Poor historian.  Denies abdominal pain, fever, vomiting or overnight rectal bleeding.  Objective: Vitals:   09/17/19 0111 09/17/19 0118 09/17/19 0129 09/17/19 0415  BP: (!) 203/89  (!) 198/84 (!) 188/77  Pulse: 98  89 95  Resp: 18  18 17   Temp:   98.4 F (36.9 C) 98.4 F (36.9 C)  TempSrc:   Oral Oral  SpO2:  99% 100% 98%  Weight:    73.6 kg  Height:        Intake/Output Summary (Last 24 hours) at 09/17/2019 0746 Last data filed at 09/17/2019 0415 Gross per 24 hour  Intake 668 ml  Output --  Net 668 ml   Filed Weights   09/16/19 0105 09/16/19 0500 09/17/19 0415  Weight: 75.8 kg 75.9 kg 73.6 kg    Examination:  General exam: Appears calm and comfortable.  Poor historian Respiratory system: Bilateral decreased breath sounds at bases with basilar crackles Cardiovascular system: S1 & S2 heard, Rate controlled Gastrointestinal system: Abdomen is nondistended, soft and nontender. Normal bowel sounds heard. Extremities: No cyanosis, clubbing; trace lower extremity edema Central nervous system: Alert and oriented. No focal neurological deficits. Moving extremities Skin: No rashes, lesions or ulcers Psychiatry: Looks anxious    Data Reviewed: I have personally reviewed following labs and imaging studies  CBC: Recent Labs  Lab 09/16/19 0229 09/16/19 2019  WBC 5.7  --   HGB 4.1* 6.6*  HCT 13.8* 20.5*  MCV 116.9*  --   PLT 194  --    Basic Metabolic Panel: Recent Labs  Lab 09/16/19 0111  NA 138  K 4.3  CL 95*  CO2 29  GLUCOSE 218*  BUN 20  CREATININE 3.64*  CALCIUM 8.7*   GFR: Estimated Creatinine Clearance: 17.6 mL/min (A) (by C-G formula based on SCr of 3.64 mg/dL (H)). Liver Function Tests: Recent Labs  Lab 09/16/19 0111  AST 46*  ALT 35  ALKPHOS 76  BILITOT 0.4  PROT 5.7*  ALBUMIN 2.7*   No results for input(s): LIPASE, AMYLASE in the last 168 hours. No results for input(s): AMMONIA in the last 168  hours. Coagulation Profile: Recent Labs  Lab 09/16/19 0111  INR 1.3*   Cardiac Enzymes: No results for input(s): CKTOTAL, CKMB, CKMBINDEX, TROPONINI in the last 168 hours. BNP (last 3 results) No results for input(s): PROBNP in the last 8760 hours. HbA1C: No results for input(s): HGBA1C in the last 72 hours. CBG: Recent Labs  Lab 09/16/19 1216 09/16/19 1629 09/16/19 1954 09/16/19 2311 09/17/19 0431  GLUCAP 160* 130* 212* 130* 106*   Lipid Profile: No results for input(s): CHOL, HDL, LDLCALC, TRIG, CHOLHDL, LDLDIRECT in the last 72 hours. Thyroid Function Tests: No results for input(s): TSH, T4TOTAL, FREET4, T3FREE, THYROIDAB in the last 72 hours. Anemia Panel: No results for input(s): VITAMINB12, FOLATE, FERRITIN, TIBC, IRON, RETICCTPCT in the last 72 hours. Sepsis Labs: No results for input(s): PROCALCITON, LATICACIDVEN in the last 168 hours.  Recent Results (from the past 240 hour(s))  Respiratory Panel by RT PCR (Flu A&B, Covid) - Nasopharyngeal Swab     Status: None   Collection Time: 09/16/19  1:25 AM   Specimen: Nasopharyngeal Swab  Result Value Ref Range Status   SARS Coronavirus 2 by RT PCR NEGATIVE NEGATIVE Final    Comment: (NOTE) SARS-CoV-2 target nucleic acids are NOT DETECTED. The SARS-CoV-2 RNA is generally detectable in upper respiratoy specimens during the acute phase of infection. The lowest concentration of SARS-CoV-2 viral copies this assay can detect is 131 copies/mL. A negative result does not preclude SARS-Cov-2 infection and should not be used as the sole basis for treatment or other patient management decisions. A negative result may occur with  improper specimen collection/handling, submission of specimen other than nasopharyngeal swab, presence of viral mutation(s) within the areas targeted by this assay, and inadequate number of viral copies (<131 copies/mL). A negative result must be combined with clinical observations, patient history,  and epidemiological information. The expected result is Negative. Fact Sheet for Patients:  PinkCheek.be Fact Sheet for Healthcare Providers:  GravelBags.it This test is not yet ap proved or cleared by the [place] FDA and  has been authorized for detection and/or diagnosis of SARS-CoV-2 by FDA under an Emergency Use Authorization (EUA). This EUA will remain  in effect (meaning this test can be used) for the duration of the COVID-19 declaration under Section 564(b)(1) of the Act, 21 U.S.C. section 360bbb-3(b)(1), unless the authorization is terminated or revoked sooner.    Influenza A by PCR NEGATIVE NEGATIVE Final   Influenza B by PCR NEGATIVE NEGATIVE Final    Comment: (NOTE) The Xpert Xpress SARS-CoV-2/FLU/RSV assay is intended as an aid in  the diagnosis of influenza from Nasopharyngeal swab specimens and  should not be used as a sole basis for treatment. Nasal washings and  aspirates are unacceptable for Xpert Xpress SARS-CoV-2/FLU/RSV  testing. Fact Sheet for Patients: PinkCheek.be Fact Sheet for Healthcare Providers: GravelBags.it This test is not yet approved or cleared by the [place] FDA and  has been authorized for detection and/or diagnosis of SARS-CoV-2 by  FDA  under an Emergency Use Authorization (EUA). This EUA will remain  in effect (meaning this test can be used) for the duration of the  Covid-19 declaration under Section 564(b)(1) of the Act, 21  U.S.C. section 360bbb-3(b)(1), unless the authorization is  terminated or revoked. Performed at [facility], [place] [address] [place], [place] [address]          Radiology Studies: No results found.      Scheduled Meds: . sodium chloride   Intravenous Once  . calcitRIOL  3 mcg Oral Q M,W,F-HD  . ferric citrate  420 mg Oral TID WC  . hydrALAZINE  10 mg Intravenous Once  .  insulin aspart  0-9 Units Subcutaneous Q4H  . pantoprazole (PROTONIX) IV  40 mg Intravenous Q12H   Continuous Infusions: . [START ON 09/22/2019] ferric gluconate (FERRLECIT/NULECIT) IV            Aline August, MD [facility] 09/17/2019, 7:46 AM

## 2019-09-17 NOTE — Progress Notes (Signed)
[facility] Progress Note  Subjective: Hb 7.6 after 3u prbc yest.  Pt on HD , c/o SOB , asking for "4L off"  Vitals:   09/17/19 0129 09/17/19 0415 09/17/19 0700 09/17/19 0802  BP: (!) 198/84 (!) 188/77  (!) 214/101  Pulse: 89 95  98  Resp: 18 17  16   Temp: 98.4 F (36.9 C) 98.4 F (36.9 C) 99 F (37.2 C) 99 F (37.2 C)  TempSrc: Oral Oral  Oral  SpO2: 100% 98%  100%  Weight:  73.6 kg    Height:        Exam: General: alert,NAD AAF in bed wd, wn Heart: RRR no m, r,g Lungs: bilat soft rales, no wheezing Abdomen: BS pos , NT, ND, No ascites or masses  Extremities: no pedal edema, L transmet amp site healed  Skin: no overt rash or pedelulcers Neuro:OX# no acute focal defcifts  Dialysis Access:  L FA  AVGG + bruit   Dialysis: [facility] MWF   3.5h  71.5kg  2/2 bath  Hep none LFA AVG   Calcitriol 3.0 mcg po /HD  Mircera  200 mcg q 2wks  Last on 09/13/19 Venofer 50mg  qwk    Assessment/Plan 1. GI Bleed - recurrent , lower w/ HGB 4.1 > 7.6 sp 3u PRBCs 2. ESRD -  HD MWF.  NO HEP HD.  HD today.  3. SOB - prob vol ^, will ^UF to 4 L on HD today 4. HTN - cont home bp meds = npo now  prn iv med, Hydralazine  25 mg  Tid/ amlodipine 10 mg / (Lasix 80ng  Bid FU UOP may be able to dc in ESRD Pt )   Fu bp in gi bld  And taper down as needed  5. Anemia  Of GI bld and ESRD= esa not due (just given 09/13/19) and venofer wed  on hd  6. Metabolic bone disease -  Phos 7.1  Binders when eating Lorin Picket) Vit d on hd  7. DM type 2 = per admit    Rob Zalyn Amend 09/17/2019, 10:57 AM  Inpatient medications: . sodium chloride   Intravenous Once  . amLODipine  10 mg Oral Daily  . calcitRIOL  3 mcg Oral Q M,W,F-HD  . ferric citrate  420 mg Oral TID WC  . hydrALAZINE  10 mg Intravenous Once  . hydrALAZINE  25 mg Oral TID  . insulin aspart  0-9 Units Subcutaneous Q4H  . labetalol  200 mg Oral BID  . pantoprazole (PROTONIX) IV  40 mg Intravenous Q12H   . [START ON 09/22/2019] ferric  gluconate (FERRLECIT/NULECIT) IV     acetaminophen **OR** acetaminophen, albuterol, labetalol, morphine injection, ondansetron **OR** ondansetron (ZOFRAN) IV Recent Labs  Lab 09/16/19 0111 09/17/19 0727  K 4.3 4.2  BUN 20 37*  CREATININE 3.64* 5.11*  CALCIUM 8.7* 9.1

## 2019-09-17 NOTE — Progress Notes (Signed)
PT Cancellation Note  Patient Details Name: [name] MRN: [id number] DOB: [birth year]   Cancelled Treatment:    Reason Eval/Treat Not Completed: Patient at procedure or test/unavailable Pt off floor at HD. Will follow.   [name] 09/17/2019, 8:26 AM Marisa Severin, PT, DPT Acute Rehabilitation Services Pager [phone] Office [phone]

## 2019-09-18 DIAGNOSIS — I10 Essential (primary) hypertension: Secondary | ICD-10-CM

## 2019-09-18 LAB — BPAM RBC
Blood Product Expiration Date: 202103112359
Blood Product Expiration Date: 202103112359
Blood Product Expiration Date: 202103112359
ISSUE DATE / TIME: 202102110322
ISSUE DATE / TIME: 202102111017
ISSUE DATE / TIME: 202102120104
Unit Type and Rh: 5100
Unit Type and Rh: 5100
Unit Type and Rh: 5100

## 2019-09-18 LAB — CBC WITH DIFFERENTIAL/PLATELET
Abs Immature Granulocytes: 0.03 10*3/uL (ref 0.00–0.07)
Basophils Absolute: 0 10*3/uL (ref 0.0–0.1)
Basophils Relative: 0 %
Eosinophils Absolute: 0.1 10*3/uL (ref 0.0–0.5)
Eosinophils Relative: 1 %
HCT: 23.5 % — ABNORMAL LOW (ref 36.0–46.0)
Hemoglobin: 7.4 g/dL — ABNORMAL LOW (ref 12.0–15.0)
Immature Granulocytes: 1 %
Lymphocytes Relative: 16 %
Lymphs Abs: 0.9 10*3/uL (ref 0.7–4.0)
MCH: 31.9 pg (ref 26.0–34.0)
MCHC: 31.5 g/dL (ref 30.0–36.0)
MCV: 101.3 fL — ABNORMAL HIGH (ref 80.0–100.0)
Monocytes Absolute: 0.4 10*3/uL (ref 0.1–1.0)
Monocytes Relative: 7 %
Neutro Abs: 4.1 10*3/uL (ref 1.7–7.7)
Neutrophils Relative %: 75 %
Platelets: 123 10*3/uL — ABNORMAL LOW (ref 150–400)
RBC: 2.32 MIL/uL — ABNORMAL LOW (ref 3.87–5.11)
RDW: 22.7 % — ABNORMAL HIGH (ref 11.5–15.5)
WBC: 5.5 10*3/uL (ref 4.0–10.5)
nRBC: 0.6 % — ABNORMAL HIGH (ref 0.0–0.2)

## 2019-09-18 LAB — BASIC METABOLIC PANEL
Anion gap: 11 (ref 5–15)
BUN: 24 mg/dL — ABNORMAL HIGH (ref 6–20)
CO2: 29 mmol/L (ref 22–32)
Calcium: 9.2 mg/dL (ref 8.9–10.3)
Chloride: 94 mmol/L — ABNORMAL LOW (ref 98–111)
Creatinine, Ser: 3.81 mg/dL — ABNORMAL HIGH (ref 0.44–1.00)
GFR calc Af Amer: 15 mL/min — ABNORMAL LOW (ref >60)
GFR calc non Af Amer: 13 mL/min — ABNORMAL LOW (ref >60)
Glucose, Bld: 218 mg/dL — ABNORMAL HIGH (ref 70–99)
Potassium: 3.7 mmol/L (ref 3.5–5.1)
Sodium: 134 mmol/L — ABNORMAL LOW (ref 135–145)

## 2019-09-18 LAB — TYPE AND SCREEN
ABO/RH(D): O POS
Antibody Screen: NEGATIVE
Unit division: 0
Unit division: 0
Unit division: 0

## 2019-09-18 LAB — GLUCOSE, CAPILLARY
Glucose-Capillary: 237 mg/dL — ABNORMAL HIGH (ref 70–99)
Glucose-Capillary: 162 mg/dL — ABNORMAL HIGH (ref 70–99)
Glucose-Capillary: 215 mg/dL — ABNORMAL HIGH (ref 70–99)
Glucose-Capillary: 170 mg/dL — ABNORMAL HIGH (ref 70–99)

## 2019-09-18 MED ORDER — SENNOSIDES-DOCUSATE SODIUM 8.6-50 MG PO TABS
1.0000 | ORAL_TABLET | Freq: Two times a day (BID) | ORAL | Status: DC
  Administered 2019-09-18 – 2019-09-21 (×5): 1 via ORAL
  Filled 2019-09-18 (×6): qty 1

## 2019-09-18 MED ORDER — BISACODYL 10 MG RE SUPP
10.0000 mg | Freq: Every day | RECTAL | Status: DC | PRN
  Filled 2019-09-18: qty 1

## 2019-09-18 MED ORDER — POLYETHYLENE GLYCOL 3350 17 G PO PACK
17.0000 g | PACK | Freq: Every day | ORAL | Status: DC | PRN
  Administered 2019-09-18: 17 g via ORAL
  Filled 2019-09-18: qty 1

## 2019-09-18 NOTE — Plan of Care (Signed)
Poc progressing.  

## 2019-09-18 NOTE — Progress Notes (Signed)
PT Cancellation Note  Patient Details Name: [name] MRN: [id number] DOB: [birth year]   Cancelled Treatment:    Reason Eval/Treat Not Completed: Patient declined, no reason specified. Pt reports generalized aches throughout her body and states that she is not going to walk today due to this. Pt also complains of constipation. Pt educates the patient on the need for mobility and ambulation to improve bowel function however the patient continues to decline PT evaluation at this time. Pt will attempt to return tomorrow.   Zenaida Niece 09/18/2019, 1:00 PM

## 2019-09-18 NOTE — Progress Notes (Signed)
Patient ID: [name], female   DOB: August 17, 1965, 54 y.o.   MRN: [id number]  PROGRESS NOTE    ANIE JUNIEL  VOH:[id number] DOB: 13-Jun-1966 DOA: 09/16/2019 PCP: Rocco Serene, MD   Brief Narrative:  54 year old female with history of ESRD on hemodialysis, recurrent GI bleed with multiple recurrent admissions with last admission in January 2021 when she underwent EGD and colonoscopy and had ablation of the angiodysplasia in the colon presented with rectal bleeding.  Hemoglobin was 4.1 admission.  GI was consulted.  Nephrology was also consulted.  She was transfused 2 units packed red cells.  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  Assessment & Plan:   Acute GI bleeding, most likely lower GI bleeding in a patient with history of recurrent GI bleeding and angiodysplasia of colon Acute on chronic blood loss anemia -Patient has had recurrent hospitalization for the same.  Patient has a history of being Jehovah's Witness but has agreed to blood transfusion during recent admissions.  Status post 3 units packed red cells transfusion during this hospitalization.  Hemoglobin 7.4 today.  Monitor H&H. -GI following.  If continues to have rectal bleeding, GI recommends bleeding scan.  End-stage renal disease on hemodialysis -Nephrology following.  Dialysis as per nephrology schedule  Diabetes mellitus type 2 with hyperglycemia Continue CBGs with SSI  Hypertension -Blood pressure elevated but improving.  Continue amlodipine, hydralazine and labetalol  DVT prophylaxis: SCDs Code Status: Full Family Communication: Spoke to patient at bedside Disposition Plan: Home in 24 to 48 hours  once bleeding stabilizes and hemoglobin remains stable and cleared by GI.  Consultants: GI/nephrology  Procedures: None  Antimicrobials: None   Subjective: Patient seen and examined at bedside.  She is a poor historian.  Does not feel well  and states that she feels achy all over and more swollen.  Denies any overnight fever, black or bloody stools.  No worsening abdominal pain.   Objective: Vitals:   09/18/19 0012 09/18/19 0446 09/18/19 0552 09/18/19 0716  BP: (!) 166/92 (!) 200/99 (!) 184/79 (!) 178/77  Pulse: 92 99 79 97  Resp: 20 18  16   Temp:  99 F (37.2 C)  98.4 F (36.9 C)  TempSrc:  Oral  Oral  SpO2: 100% 96% 100% 99%  Weight:  70.1 kg    Height:        Intake/Output Summary (Last 24 hours) at 09/18/2019 0737 Last data filed at 09/17/2019 2027 Gross per 24 hour  Intake 0 ml  Output --  Net 0 ml   Filed Weights   09/16/19 0500 09/17/19 0415 09/18/19 0446  Weight: 75.9 kg 73.6 kg 70.1 kg    Examination:  General exam: No acute distress.  Poor historian.   Respiratory system: Bilateral decreased breath sounds at bases with scattered crackles  cardiovascular system: Rate controlled, S1-S2 heard Gastrointestinal system: Abdomen is nondistended, soft and nontender.  Bowel sounds heard  extremities: No clubbing or cyanosis; trace lower and upper extremity edema present bilaterally   Data Reviewed: I have personally reviewed following labs and imaging studies  CBC: Recent Labs  Lab 09/16/19 0229 09/16/19 2019 09/17/19 0727  WBC 5.7  --  6.8  NEUTROABS  --   --  5.2  HGB 4.1* 6.6* 7.4*  HCT 13.8* 20.5* 22.9*  MCV 116.9*  --  98.3  PLT 194  --  694*   Basic Metabolic Panel: Recent Labs  Lab 09/16/19  0111 09/17/19 0727  NA 138 136  K 4.3 4.2  CL 95* 93*  CO2 29 30  GLUCOSE 218* 111*  BUN 20 37*  CREATININE 3.64* 5.11*  CALCIUM 8.7* 9.1   GFR: Estimated Creatinine Clearance: 12.2 mL/min (A) (by C-G formula based on SCr of 5.11 mg/dL (H)). Liver Function Tests: Recent Labs  Lab 09/16/19 0111  AST 46*  ALT 35  ALKPHOS 76  BILITOT 0.4  PROT 5.7*  ALBUMIN 2.7*   No results for input(s): LIPASE, AMYLASE in the last 168 hours. No results for input(s): AMMONIA in the last 168  hours. Coagulation Profile: Recent Labs  Lab 09/16/19 0111  INR 1.3*   Cardiac Enzymes: No results for input(s): CKTOTAL, CKMB, CKMBINDEX, TROPONINI in the last 168 hours. BNP (last 3 results) No results for input(s): PROBNP in the last 8760 hours. HbA1C: No results for input(s): HGBA1C in the last 72 hours. CBG: Recent Labs  Lab 09/17/19 0431 09/17/19 0800 09/17/19 1505 09/17/19 2122 09/18/19 0616  GLUCAP 106* 107* 128* 248* 237*   Lipid Profile: No results for input(s): CHOL, HDL, LDLCALC, TRIG, CHOLHDL, LDLDIRECT in the last 72 hours. Thyroid Function Tests: No results for input(s): TSH, T4TOTAL, FREET4, T3FREE, THYROIDAB in the last 72 hours. Anemia Panel: No results for input(s): VITAMINB12, FOLATE, FERRITIN, TIBC, IRON, RETICCTPCT in the last 72 hours. Sepsis Labs: No results for input(s): PROCALCITON, LATICACIDVEN in the last 168 hours.  Recent Results (from the past 240 hour(s))  Respiratory Panel by RT PCR (Flu A&B, Covid) - Nasopharyngeal Swab     Status: None   Collection Time: 09/16/19  1:25 AM   Specimen: Nasopharyngeal Swab  Result Value Ref Range Status   SARS Coronavirus 2 by RT PCR NEGATIVE NEGATIVE Final    Comment: (NOTE) SARS-CoV-2 target nucleic acids are NOT DETECTED. The SARS-CoV-2 RNA is generally detectable in upper respiratoy specimens during the acute phase of infection. The lowest concentration of SARS-CoV-2 viral copies this assay can detect is 131 copies/mL. A negative result does not preclude SARS-Cov-2 infection and should not be used as the sole basis for treatment or other patient management decisions. A negative result may occur with  improper specimen collection/handling, submission of specimen other than nasopharyngeal swab, presence of viral mutation(s) within the areas targeted by this assay, and inadequate number of viral copies (<131 copies/mL). A negative result must be combined with clinical observations, patient history,  and epidemiological information. The expected result is Negative. Fact Sheet for Patients:  PinkCheek.be Fact Sheet for Healthcare Providers:  GravelBags.it This test is not yet ap proved or cleared by the [place] FDA and  has been authorized for detection and/or diagnosis of SARS-CoV-2 by FDA under an Emergency Use Authorization (EUA). This EUA will remain  in effect (meaning this test can be used) for the duration of the COVID-19 declaration under Section 564(b)(1) of the Act, 21 U.S.C. section 360bbb-3(b)(1), unless the authorization is terminated or revoked sooner.    Influenza A by PCR NEGATIVE NEGATIVE Final   Influenza B by PCR NEGATIVE NEGATIVE Final    Comment: (NOTE) The Xpert Xpress SARS-CoV-2/FLU/RSV assay is intended as an aid in  the diagnosis of influenza from Nasopharyngeal swab specimens and  should not be used as a sole basis for treatment. Nasal washings and  aspirates are unacceptable for Xpert Xpress SARS-CoV-2/FLU/RSV  testing. Fact Sheet for Patients: PinkCheek.be Fact Sheet for Healthcare Providers: GravelBags.it This test is not yet approved or cleared by the [place]  FDA and  has been authorized for detection and/or diagnosis of SARS-CoV-2 by  FDA under an Emergency Use Authorization (EUA). This EUA will remain  in effect (meaning this test can be used) for the duration of the  Covid-19 declaration under Section 564(b)(1) of the Act, 21  U.S.C. section 360bbb-3(b)(1), unless the authorization is  terminated or revoked. Performed at [facility], [place] [address], [place],  [address]          Radiology Studies: No results found.      Scheduled Meds: . sodium chloride   Intravenous Once  . amLODipine  10 mg Oral Daily  . calcitRIOL  3 mcg Oral Q M,W,F-HD  . ferric citrate  420 mg Oral TID WC  . hydrALAZINE   10 mg Intravenous Once  . hydrALAZINE  25 mg Oral TID  . insulin aspart  0-9 Units Subcutaneous TID WC & HS  . labetalol  200 mg Oral BID  . pantoprazole (PROTONIX) IV  40 mg Intravenous Q12H   Continuous Infusions: . [START ON 09/22/2019] ferric gluconate (FERRLECIT/NULECIT) IV            Aline August, MD [facility] 09/18/2019, 7:37 AM

## 2019-09-18 NOTE — Progress Notes (Signed)
[facility] Progress Note  Chief Complaint: Hematochezia  History:  No more hematochezia since admission.  Had not had a bowel movement, felt she was getting somewhat "full", has some prune juice and hope she will get output from this.  ROS: Cardiovascular: No chest pain Respiratory: No dyspnea Urinary: No dysuria  Objective:   Current Facility-Administered Medications:  .  0.9 %  sodium chloride infusion (Manually program via Guardrails IV Fluids), , Intravenous, Once, Blount, Scarlette Shorts T, NP .  acetaminophen (TYLENOL) tablet 650 mg, 650 mg, Oral, Q6H PRN **OR** acetaminophen (TYLENOL) suppository 650 mg, 650 mg, Rectal, Q6H PRN, Rise Patience, MD .  albuterol (PROVENTIL) (2.5 MG/3ML) 0.083% nebulizer solution 3 mL, 3 mL, Inhalation, Q4H PRN, Rise Patience, MD, 3 mL at 09/17/19 0118 .  amLODipine (NORVASC) tablet 10 mg, 10 mg, Oral, Daily, Alekh, Kshitiz, MD, 10 mg at 09/18/19 1100 .  bisacodyl (DULCOLAX) suppository 10 mg, 10 mg, Rectal, Daily PRN, Starla Link, Kshitiz, MD .  calcitRIOL (ROCALTROL) capsule 3 mcg, 3 mcg, Oral, Q M,W,F-HD, Zeyfang, David, PA-C, 3 mcg at 09/17/19 1541 .  ferric citrate (AURYXIA) tablet 420 mg, 420 mg, Oral, TID WC, Ernest Haber, PA-C, 420 mg at 09/18/19 1107 .  [START ON 09/22/2019] ferric gluconate (NULECIT) 62.5 mg in sodium chloride 0.9 % 100 mL IVPB, 62.5 mg, Intravenous, Q Wed-HD, Zeyfang, David, PA-C .  hydrALAZINE (APRESOLINE) injection 10 mg, 10 mg, Intravenous, Once, Blount, Xenia T, NP .  hydrALAZINE (APRESOLINE) tablet 25 mg, 25 mg, Oral, TID, Alekh, Kshitiz, MD, 25 mg at 09/18/19 1700 .  insulin aspart (novoLOG) injection 0-9 Units, 0-9 Units, Subcutaneous, TID WC & HS, Alekh, Kshitiz, MD, 3 Units at 09/18/19 1711 .  labetalol (NORMODYNE) injection 10 mg, 10 mg, Intravenous, Q2H PRN, Rise Patience, MD, 10 mg at 09/18/19 1758 .  labetalol (NORMODYNE) tablet 200 mg, 200 mg, Oral, BID, Alekh, Kshitiz, MD, 200 mg at 09/18/19 1100 .   morphine 2 MG/ML injection 2 mg, 2 mg, Intravenous, Q4H PRN, Rise Patience, MD, 2 mg at 09/18/19 1722 .  ondansetron (ZOFRAN) tablet 4 mg, 4 mg, Oral, Q6H PRN **OR** ondansetron (ZOFRAN) injection 4 mg, 4 mg, Intravenous, Q6H PRN, Rise Patience, MD .  pantoprazole (PROTONIX) injection 40 mg, 40 mg, Intravenous, Q12H, Rise Patience, MD, 40 mg at 09/18/19 1054 .  polyethylene glycol (MIRALAX / GLYCOLAX) packet 17 g, 17 g, Oral, Daily PRN, Starla Link, Kshitiz, MD, 17 g at 09/18/19 1717 .  senna-docusate (Senokot-S) tablet 1 tablet, 1 tablet, Oral, BID, Starla Link, Kshitiz, MD  . Derrill Memo ON 09/22/2019] ferric gluconate (FERRLECIT/NULECIT) IV       Vital signs in last 24 hrs: Vitals:   09/18/19 0716 09/18/19 1623  BP: (!) 178/77 (!) 173/78  Pulse: 97 88  Resp: 16 20  Temp: 98.4 F (36.9 C) 98.4 F (36.9 C)  SpO2: 99% 94%    Intake/Output Summary (Last 24 hours) at 09/18/2019 1838 Last data filed at 09/18/2019 0840 Gross per 24 hour  Intake 200 ml  Output --  Net 200 ml     Physical Exam   Cardiac: RRR without murmurs, S1S2 heard, no peripheral edema  Pulm: clear to auscultation bilaterally, normal RR and effort noted  Abdomen: soft, no tenderness, with active bowel sounds. No guarding or palpable hepatosplenomegaly  Skin; warm and dry, no jaundice  Recent Labs:  CBC Latest Ref Rng & Units 09/18/2019 09/17/2019 09/16/2019  WBC 4.0 - 10.5 K/uL 5.5 6.8 -  Hemoglobin  12.0 - 15.0 g/dL 7.4(L) 7.4(L) 6.6(LL)  Hematocrit 36.0 - 46.0 % 23.5(L) 22.9(L) 20.5(L)  Platelets 150 - 400 K/uL 123(L) 148(L) -    Recent Labs  Lab 09/16/19 0111  INR 1.3*   CMP Latest Ref Rng & Units 09/18/2019 09/17/2019 09/16/2019  Glucose 70 - 99 mg/dL 218(H) 111(H) 218(H)  BUN 6 - 20 mg/dL 24(H) 37(H) 20  Creatinine 0.44 - 1.00 mg/dL 3.81(H) 5.11(H) 3.64(H)  Sodium 135 - 145 mmol/L 134(L) 136 138  Potassium 3.5 - 5.1 mmol/L 3.7 4.2 4.3  Chloride 98 - 111 mmol/L 94(L) 93(L) 95(L)  CO2 22 - 32  mmol/L 29 30 29   Calcium 8.9 - 10.3 mg/dL 9.2 9.1 8.7(L)  Total Protein 6.5 - 8.1 g/dL - - 5.7(L)  Total Bilirubin 0.3 - 1.2 mg/dL - - 0.4  Alkaline Phos 38 - 126 U/L - - 76  AST 15 - 41 U/L - - 46(H)  ALT 0 - 44 U/L - - 35    @ASSESSMENTPLANBEGIN @ Assessment:  Hematochezia History of colonic AVM bleed  No more bleeding since admission, hemoglobin stable overall.   Plan: Signing off.  If she has recurrent hematochezia, please order a tagged RBC scan and let us know.   Nelida Meuse III Office: [phone]

## 2019-09-18 NOTE — Progress Notes (Addendum)
  [facility] Progress Note   Subjective:  Seen in room. Feels much better after dialysis/transfusion. No BM today   Objective Vitals:   09/18/19 0012 09/18/19 0446 09/18/19 0552 09/18/19 0716  BP: (!) 166/92 (!) 200/99 (!) 184/79 (!) 178/77  Pulse: 92 99 79 97  Resp: 20 18  16   Temp:  99 F (37.2 C)  98.4 F (36.9 C)  TempSrc:  Oral  Oral  SpO2: 100% 96% 100% 99%  Weight:  70.1 kg    Height:        Weight change: -3.473 kg  70.1 kg  Additional Objective Labs: Basic Metabolic Panel: Recent Labs  Lab 09/16/19 0111 09/17/19 0727 09/18/19 0743  NA 138 136 134*  K 4.3 4.2 3.7  CL 95* 93* 94*  CO2 29 30 29   GLUCOSE 218* 111* 218*  BUN 20 37* 24*  CREATININE 3.64* 5.11* 3.81*  CALCIUM 8.7* 9.1 9.2   CBC: Recent Labs  Lab 09/16/19 0229 09/16/19 0229 09/16/19 2019 09/17/19 0727 09/18/19 0743  WBC 5.7  --   --  6.8 5.5  NEUTROABS  --   --   --  5.2 4.1  HGB 4.1*   < > 6.6* 7.4* 7.4*  HCT 13.8*   < > 20.5* 22.9* 23.5*  MCV 116.9*  --   --  98.3 101.3*  PLT 194  --   --  148* 123*   < > = values in this interval not displayed.   Blood Culture    Component Value Date/Time   SDES BLOOD RIGHT UPPER ARM 02/25/2017 1927   SPECREQUEST  02/25/2017 1927    BOTTLES DRAWN AEROBIC AND ANAEROBIC Blood Culture adequate volume   CULT NO GROWTH 5 DAYS 02/25/2017 1927   REPTSTATUS 03/02/2017 FINAL 02/25/2017 1927     Physical Exam General: alert, well appearing, nad  Heart: RRR no m,r,g  Lungs: CTAB  Abdomen: soft non-tender  Extremities: trace LE edema, bilaterally  Dialysis Access: LUE AVG +bruit   Medications: . [START ON 09/22/2019] ferric gluconate (FERRLECIT/NULECIT) IV     . sodium chloride   Intravenous Once  . amLODipine  10 mg Oral Daily  . calcitRIOL  3 mcg Oral Q M,W,F-HD  . ferric citrate  420 mg Oral TID WC  . hydrALAZINE  10 mg Intravenous Once  . hydrALAZINE  25 mg Oral TID  . insulin aspart  0-9 Units Subcutaneous TID WC & HS  .  labetalol  200 mg Oral BID  . pantoprazole (PROTONIX) IV  40 mg Intravenous Q12H    Dialysis Orders:  GO MWF 3.5h 71.5kg 2/2 bath Hep none LFA AVG  -Calcitriol 3.0 -Mircera 200 mcg q 2wks Last on 09/13/19 Venofer 50mg  qwk   Assessment/Plan: 1. GI bleeding, recurrent/ABLA/ anemia CKD- hx of angiodysplasia of colon. GI following, considering bleeding scan.  Hgb 4.1 on admit >> 7.4 s/p 3 units prbcs. Last ESA Mircera 200 on 2/8. Cont Fe q wk 2. ESRD - HD MWF. Next HD 2/15 3. HTN/volume- BP up. Continue home meds.  Net UF 4L on 2/12 with cramping. Now below EDW.  4. MBD of CKD -  Contine Auryxia binder/Vit D.  5. Nutrition - Renal diet/fluid restriction   Lynnda Child PA-C [place] Pager [phone] 09/18/2019,11:39 AM  LOS: 2 days   Pt seen, examined and agree w A/P as above.  Kelly Splinter  MD 09/18/2019, 1:03 PM

## 2019-09-19 ENCOUNTER — Inpatient Hospital Stay (HOSPITAL_COMMUNITY): Payer: Medicare Other

## 2019-09-19 DIAGNOSIS — K5900 Constipation, unspecified: Secondary | ICD-10-CM

## 2019-09-19 LAB — CBC WITH DIFFERENTIAL/PLATELET
Abs Immature Granulocytes: 0.03 10*3/uL (ref 0.00–0.07)
Basophils Absolute: 0 10*3/uL (ref 0.0–0.1)
Basophils Relative: 0 %
Eosinophils Absolute: 0.1 10*3/uL (ref 0.0–0.5)
Eosinophils Relative: 2 %
HCT: 23.2 % — ABNORMAL LOW (ref 36.0–46.0)
Hemoglobin: 7.5 g/dL — ABNORMAL LOW (ref 12.0–15.0)
Immature Granulocytes: 1 %
Lymphocytes Relative: 17 %
Lymphs Abs: 0.7 10*3/uL (ref 0.7–4.0)
MCH: 32.8 pg (ref 26.0–34.0)
MCHC: 32.3 g/dL (ref 30.0–36.0)
MCV: 101.3 fL — ABNORMAL HIGH (ref 80.0–100.0)
Monocytes Absolute: 0.4 10*3/uL (ref 0.1–1.0)
Monocytes Relative: 9 %
Neutro Abs: 3 10*3/uL (ref 1.7–7.7)
Neutrophils Relative %: 71 %
Platelets: 116 10*3/uL — ABNORMAL LOW (ref 150–400)
RBC: 2.29 MIL/uL — ABNORMAL LOW (ref 3.87–5.11)
RDW: 22.5 % — ABNORMAL HIGH (ref 11.5–15.5)
WBC: 4.2 10*3/uL (ref 4.0–10.5)
nRBC: 0.7 % — ABNORMAL HIGH (ref 0.0–0.2)

## 2019-09-19 LAB — BASIC METABOLIC PANEL
Anion gap: 14 (ref 5–15)
BUN: 36 mg/dL — ABNORMAL HIGH (ref 6–20)
CO2: 26 mmol/L (ref 22–32)
Calcium: 8.8 mg/dL — ABNORMAL LOW (ref 8.9–10.3)
Chloride: 91 mmol/L — ABNORMAL LOW (ref 98–111)
Creatinine, Ser: 5 mg/dL — ABNORMAL HIGH (ref 0.44–1.00)
GFR calc Af Amer: 11 mL/min — ABNORMAL LOW (ref >60)
GFR calc non Af Amer: 9 mL/min — ABNORMAL LOW (ref >60)
Glucose, Bld: 239 mg/dL — ABNORMAL HIGH (ref 70–99)
Potassium: 3.9 mmol/L (ref 3.5–5.1)
Sodium: 131 mmol/L — ABNORMAL LOW (ref 135–145)

## 2019-09-19 LAB — GLUCOSE, CAPILLARY
Glucose-Capillary: 191 mg/dL — ABNORMAL HIGH (ref 70–99)
Glucose-Capillary: 186 mg/dL — ABNORMAL HIGH (ref 70–99)
Glucose-Capillary: 223 mg/dL — ABNORMAL HIGH (ref 70–99)
Glucose-Capillary: 169 mg/dL — ABNORMAL HIGH (ref 70–99)

## 2019-09-19 LAB — MRSA PCR SCREENING: MRSA by PCR: POSITIVE — AB

## 2019-09-19 MED ORDER — HYDRALAZINE HCL 50 MG PO TABS
50.0000 mg | ORAL_TABLET | Freq: Three times a day (TID) | ORAL | Status: DC
  Administered 2019-09-19 – 2019-09-21 (×7): 50 mg via ORAL
  Filled 2019-09-19 (×7): qty 1

## 2019-09-19 MED ORDER — POLYETHYLENE GLYCOL 3350 17 G PO PACK
17.0000 g | PACK | Freq: Two times a day (BID) | ORAL | Status: DC
  Administered 2019-09-19 – 2019-09-20 (×2): 17 g via ORAL
  Filled 2019-09-19 (×4): qty 1

## 2019-09-19 MED ORDER — CHLORHEXIDINE GLUCONATE CLOTH 2 % EX PADS
6.0000 | MEDICATED_PAD | Freq: Every day | CUTANEOUS | Status: DC
  Administered 2019-09-20 – 2019-09-21 (×2): 6 via TOPICAL

## 2019-09-19 MED ORDER — MUPIROCIN 2 % EX OINT
1.0000 "application " | TOPICAL_OINTMENT | Freq: Two times a day (BID) | CUTANEOUS | Status: DC
  Administered 2019-09-19 – 2019-09-21 (×5): 1 via NASAL
  Filled 2019-09-19: qty 22

## 2019-09-19 NOTE — Progress Notes (Signed)
Soap suds enema given.  Pt tolerated well.  Small amount of black tarry stool resulted nothing more.  Pt appears to have some impaction.

## 2019-09-19 NOTE — Progress Notes (Signed)
[facility] Progress Note   Subjective:  Seen in room. Hgb stable. No further bleeding, hasn't had BM. No CP, SOB. Constipated. Feels like too much fluid removed on dialysis Friday  Objective Vitals:   09/19/19 0227 09/19/19 0319 09/19/19 0554 09/19/19 0945  BP: (!) 173/89 (!) 179/71 (!) 179/90 (!) 190/90  Pulse:   81 84  Resp:   20   Temp:   98.7 F (37.1 C)   TempSrc:   Oral   SpO2:   97%   Weight:      Height:         Additional Objective Labs: Basic Metabolic Panel: Recent Labs  Lab 09/17/19 0727 09/18/19 0743 09/19/19 0227  NA 136 134* 131*  K 4.2 3.7 3.9  CL 93* 94* 91*  CO2 30 29 26   GLUCOSE 111* 218* 239*  BUN 37* 24* 36*  CREATININE 5.11* 3.81* 5.00*  CALCIUM 9.1 9.2 8.8*   CBC: Recent Labs  Lab 09/16/19 0229 09/16/19 2019 09/17/19 0727 09/18/19 0743 09/19/19 0227  WBC 5.7  --  6.8 5.5 4.2  NEUTROABS  --   --  5.2 4.1 3.0  HGB 4.1*   < > 7.4* 7.4* 7.5*  HCT 13.8*   < > 22.9* 23.5* 23.2*  MCV 116.9*  --  98.3 101.3* 101.3*  PLT 194  --  148* 123* 116*   < > = values in this interval not displayed.   Blood Culture    Component Value Date/Time   SDES BLOOD RIGHT UPPER ARM 02/25/2017 1927   SPECREQUEST  02/25/2017 1927    BOTTLES DRAWN AEROBIC AND ANAEROBIC Blood Culture adequate volume   CULT NO GROWTH 5 DAYS 02/25/2017 1927   REPTSTATUS 03/02/2017 FINAL 02/25/2017 1927     Physical Exam General: alert, well appearing, nad  Heart: RRR no m,r,g  Lungs: CTAB  Abdomen: soft non-tender  Extremities: trace LE edema, bilaterally  Dialysis Access: LUE AVG +bruit   Medications: . [START ON 09/22/2019] ferric gluconate (FERRLECIT/NULECIT) IV     . sodium chloride   Intravenous Once  . amLODipine  10 mg Oral Daily  . calcitRIOL  3 mcg Oral Q M,W,F-HD  . ferric citrate  420 mg Oral TID WC  . hydrALAZINE  10 mg Intravenous Once  . hydrALAZINE  50 mg Oral TID  . insulin aspart  0-9 Units Subcutaneous TID WC & HS  . labetalol   200 mg Oral BID  . pantoprazole (PROTONIX) IV  40 mg Intravenous Q12H  . senna-docusate  1 tablet Oral BID    Dialysis Orders:  GO MWF 3.5h 71.5kg 2/2 bath Hep none LFA AVG  -Calcitriol 3.0 -Mircera 200 mcg q 2wks Last on 09/13/19 Venofer 50mg  qwk   Assessment/Plan: 1. GI bleeding, recurrent/ABLA/ anemia CKD- hx of angiodysplasia of colon. GI following, considering bleeding scan if bleeding continues.  Hgb 4.1 on admit >> 7.4 s/p 3 units prbcs. Last ESA Mircera 200 on 2/8. Cont Fe q wk 2. ESRD - HD MWF. Next HD 2/15 3. HTN/volume- BP remains elevated. Continue home meds.  Net UF 4L on 2/12 with cramping. Now below EDW. Gentle UF next HD.  4. MBD of CKD -  Contine Auryxia binder/Vit D.  5. Nutrition - Renal diet/fluid restriction  6. Constipation. Senokot/Miralax ordered. Avoid Fleet enema. Can use soap suds.   Lynnda Child PA-C [facility] Pager [phone] 09/19/2019,11:05 AM  LOS: 3 days

## 2019-09-19 NOTE — Progress Notes (Addendum)
Patient complains of right sided headache with worsening vision on right side and a slight facial droop. BP 167/92 pulse 73  texted paged DR. Tonny Bollman.  Dr. Tonny Bollman notified of positive PCR MRSA test

## 2019-09-19 NOTE — Progress Notes (Signed)
PT Cancellation Note  Patient Details Name: [name] MRN: [id number] DOB: 05-11-1966   Cancelled Treatment:    Reason Eval/Treat Not Completed: Other (comment) Pt refused therapy evaluation secondary to malaise and stated she was about to receive an enema.    Wyona Almas, PT, DPT [facility] Pager [phone] Office [phone]    Deno Etienne 09/19/2019, 1:45 PM

## 2019-09-19 NOTE — Progress Notes (Signed)
Pt vomited very large amount of food particle in trashcan, unable to measure. Pt stated she had not moved her bowels in 4 days. Stated she had bottle of iron supplement that her family bought before coming to hospitals and can feel she is clogged up. Pt had tried other bowel regiment. Offered dulcolax suppository but pt refused. She requested something to drink. She asked if she could get two bottles of contrast that they use for scan. I told her I cant order her contrast. Prn med for nausea administered. On  Call triad NP X [place] paged requesting po liquid meds. Pt stated if she could move her bowels her BP will go down. Will continue to monitor.

## 2019-09-19 NOTE — Progress Notes (Signed)
Patient ID: [name], female   DOB: [birth year], 54 y.o.   MRN: [id number]  PROGRESS NOTE    [name]  MBT:[id number] DOB: Dec 08, 1965 DOA: 09/16/2019 PCP: Rocco Serene, MD   Brief Narrative:  54 year old female with history of ESRD on hemodialysis, recurrent GI bleed with multiple recurrent admissions with last admission in January 2021 when she underwent EGD and colonoscopy and had ablation of the angiodysplasia in the colon presented with rectal bleeding.  Hemoglobin was 4.1 admission.  GI was consulted.  Nephrology was also consulted.  She was transfused 2 units packed red cells.  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  Assessment & Plan:   Acute GI bleeding, most likely lower GI bleeding in a patient with history of recurrent GI bleeding and angiodysplasia of colon Acute on chronic blood loss anemia -Patient has had recurrent hospitalization for the same.  Patient has a history of being Jehovah's Witness but has agreed to blood transfusion during recent admissions.  Status post 3 units packed red cells transfusion during this hospitalization.  Hemoglobin 7.5 today.  Monitor H&H. -GI signed off.  If continues to have rectal bleeding, GI recommends bleeding scan.  Nausea and vomiting -Probably from constipation.  Rule out bowel obstruction.  Will get abdominal x-ray this morning.  Patient refused Dulcolax suppository this morning.  Continue Senokot/MiraLAX.  Might have to give enema.  End-stage renal disease on hemodialysis -Nephrology following.  Dialysis as per nephrology schedule  Diabetes mellitus type 2 with hyperglycemia -Continue CBGs with SSI  Hypertension -Blood pressure still elevated.  Continue amlodipine and labetalol.  Increase the dose of hydralazine to 50 mg 3 times daily.  DVT prophylaxis: SCDs Code Status: Full Family Communication: Spoke to patient at bedside Disposition Plan: Home in  1 to 2 days if stops vomiting. Consultants: GI/nephrology  Procedures: None  Antimicrobials: None   Subjective: Patient seen and examined at bedside.  She is a poor historian.  Complains of nausea and vomiting this morning.  Has not had a bowel movement yet.  As per nursing staff, she refused Dulcolax suppository.  No overnight fever  objective: Vitals:   09/19/19 0115 09/19/19 0227 09/19/19 0319 09/19/19 0554  BP: (!) 179/89 (!) 173/89 (!) 179/71 (!) 179/90  Pulse: 80   81  Resp:    20  Temp:    98.7 F (37.1 C)  TempSrc:    Oral  SpO2:    97%  Weight:      Height:        Intake/Output Summary (Last 24 hours) at 09/19/2019 0753 Last data filed at 09/19/2019 0300 Gross per 24 hour  Intake 250 ml  Output --  Net 250 ml   Filed Weights   09/16/19 0500 09/17/19 0415 09/18/19 0446  Weight: 75.9 kg 73.6 kg 70.1 kg    Examination:  General exam: No distress.  Poor historian.   Respiratory system: Bilateral decreased breath sounds at bases with some crackles.   Cardiovascular system: S1-S2 heard, rate controlled Gastrointestinal system: Abdomen is nondistended, soft and nontender.  Bowel sounds sluggish  extremities: No cyanosis; trace lower and upper extremity edema present bilaterally   Data Reviewed: I have personally reviewed following labs and imaging studies  CBC: Recent Labs  Lab 09/16/19 0229 09/16/19 2019 09/17/19 0727 09/18/19 0743 09/19/19 0227  WBC 5.7  --  6.8 5.5 4.2  NEUTROABS  --   --  5.2  4.1 3.0  HGB 4.1* 6.6* 7.4* 7.4* 7.5*  HCT 13.8* 20.5* 22.9* 23.5* 23.2*  MCV 116.9*  --  98.3 101.3* 101.3*  PLT 194  --  148* 123* 161*   Basic Metabolic Panel: Recent Labs  Lab 09/16/19 0111 09/17/19 0727 09/18/19 0743 09/19/19 0227  NA 138 136 134* 131*  K 4.3 4.2 3.7 3.9  CL 95* 93* 94* 91*  CO2 29 30 29 26   GLUCOSE 218* 111* 218* 239*  BUN 20 37* 24* 36*  CREATININE 3.64* 5.11* 3.81* 5.00*  CALCIUM 8.7* 9.1 9.2 8.8*   GFR: Estimated  Creatinine Clearance: 12.5 mL/min (A) (by C-G formula based on SCr of 5 mg/dL (H)). Liver Function Tests: Recent Labs  Lab 09/16/19 0111  AST 46*  ALT 35  ALKPHOS 76  BILITOT 0.4  PROT 5.7*  ALBUMIN 2.7*   No results for input(s): LIPASE, AMYLASE in the last 168 hours. No results for input(s): AMMONIA in the last 168 hours. Coagulation Profile: Recent Labs  Lab 09/16/19 0111  INR 1.3*   Cardiac Enzymes: No results for input(s): CKTOTAL, CKMB, CKMBINDEX, TROPONINI in the last 168 hours. BNP (last 3 results) No results for input(s): PROBNP in the last 8760 hours. HbA1C: No results for input(s): HGBA1C in the last 72 hours. CBG: Recent Labs  Lab 09/18/19 0616 09/18/19 1112 09/18/19 1621 09/18/19 1936 09/19/19 0634  GLUCAP 237* 162* 215* 170* 191*   Lipid Profile: No results for input(s): CHOL, HDL, LDLCALC, TRIG, CHOLHDL, LDLDIRECT in the last 72 hours. Thyroid Function Tests: No results for input(s): TSH, T4TOTAL, FREET4, T3FREE, THYROIDAB in the last 72 hours. Anemia Panel: No results for input(s): VITAMINB12, FOLATE, FERRITIN, TIBC, IRON, RETICCTPCT in the last 72 hours. Sepsis Labs: No results for input(s): PROCALCITON, LATICACIDVEN in the last 168 hours.  Recent Results (from the past 240 hour(s))  Respiratory Panel by RT PCR (Flu A&B, Covid) - Nasopharyngeal Swab     Status: None   Collection Time: 09/16/19  1:25 AM   Specimen: Nasopharyngeal Swab  Result Value Ref Range Status   SARS Coronavirus 2 by RT PCR NEGATIVE NEGATIVE Final    Comment: (NOTE) SARS-CoV-2 target nucleic acids are NOT DETECTED. The SARS-CoV-2 RNA is generally detectable in upper respiratoy specimens during the acute phase of infection. The lowest concentration of SARS-CoV-2 viral copies this assay can detect is 131 copies/mL. A negative result does not preclude SARS-Cov-2 infection and should not be used as the sole basis for treatment or other patient management decisions. A negative  result may occur with  improper specimen collection/handling, submission of specimen other than nasopharyngeal swab, presence of viral mutation(s) within the areas targeted by this assay, and inadequate number of viral copies (<131 copies/mL). A negative result must be combined with clinical observations, patient history, and epidemiological information. The expected result is Negative. Fact Sheet for Patients:  PinkCheek.be Fact Sheet for Healthcare Providers:  GravelBags.it This test is not yet ap proved or cleared by the [place] FDA and  has been authorized for detection and/or diagnosis of SARS-CoV-2 by FDA under an Emergency Use Authorization (EUA). This EUA will remain  in effect (meaning this test can be used) for the duration of the COVID-19 declaration under Section 564(b)(1) of the Act, 21 U.S.C. section 360bbb-3(b)(1), unless the authorization is terminated or revoked sooner.    Influenza A by PCR NEGATIVE NEGATIVE Final   Influenza B by PCR NEGATIVE NEGATIVE Final    Comment: (NOTE) The Xpert Xpress SARS-CoV-2/FLU/RSV  assay is intended as an aid in  the diagnosis of influenza from Nasopharyngeal swab specimens and  should not be used as a sole basis for treatment. Nasal washings and  aspirates are unacceptable for Xpert Xpress SARS-CoV-2/FLU/RSV  testing. Fact Sheet for Patients: PinkCheek.be Fact Sheet for Healthcare Providers: GravelBags.it This test is not yet approved or cleared by the [place] FDA and  has been authorized for detection and/or diagnosis of SARS-CoV-2 by  FDA under an Emergency Use Authorization (EUA). This EUA will remain  in effect (meaning this test can be used) for the duration of the  Covid-19 declaration under Section 564(b)(1) of the Act, 21  U.S.C. section 360bbb-3(b)(1), unless the authorization is  terminated or  revoked. Performed at [facility], [place] [address], [place], [place] [address]          Radiology Studies: No results found.      Scheduled Meds: . sodium chloride   Intravenous Once  . amLODipine  10 mg Oral Daily  . calcitRIOL  3 mcg Oral Q M,W,F-HD  . ferric citrate  420 mg Oral TID WC  . hydrALAZINE  10 mg Intravenous Once  . hydrALAZINE  25 mg Oral TID  . insulin aspart  0-9 Units Subcutaneous TID WC & HS  . labetalol  200 mg Oral BID  . pantoprazole (PROTONIX) IV  40 mg Intravenous Q12H  . senna-docusate  1 tablet Oral BID   Continuous Infusions: . [START ON 09/22/2019] ferric gluconate (FERRLECIT/NULECIT) IV            Aline August, MD [facility] 09/19/2019, 7:53 AM

## 2019-09-19 NOTE — Progress Notes (Signed)
Paged on call triad NP X [place] stating pt's BP 179/71, Prn labetalol didn't make any difference and pt had received PM bp meds too. Received care order saying no additional meds at this time. Pt stated her SBP used to be in 200's whenever she went for dialysis. No distress . Will continue to monitor.

## 2019-09-19 NOTE — Progress Notes (Signed)
Pt transferred today to [facility].  Pt left with all of their personal belongings.  Report given to Kelvin Cellar RN.

## 2019-09-20 DIAGNOSIS — Z7189 Other specified counseling: Secondary | ICD-10-CM

## 2019-09-20 DIAGNOSIS — Z515 Encounter for palliative care: Secondary | ICD-10-CM

## 2019-09-20 LAB — CBC WITH DIFFERENTIAL/PLATELET
Abs Immature Granulocytes: 0.01 10*3/uL (ref 0.00–0.07)
Basophils Absolute: 0 10*3/uL (ref 0.0–0.1)
Basophils Relative: 0 %
Eosinophils Absolute: 0.1 10*3/uL (ref 0.0–0.5)
Eosinophils Relative: 3 %
HCT: 22.1 % — ABNORMAL LOW (ref 36.0–46.0)
Hemoglobin: 6.9 g/dL — CL (ref 12.0–15.0)
Immature Granulocytes: 0 %
Lymphocytes Relative: 17 %
Lymphs Abs: 0.7 10*3/uL (ref 0.7–4.0)
MCH: 31.8 pg (ref 26.0–34.0)
MCHC: 31.2 g/dL (ref 30.0–36.0)
MCV: 101.8 fL — ABNORMAL HIGH (ref 80.0–100.0)
Monocytes Absolute: 0.4 10*3/uL (ref 0.1–1.0)
Monocytes Relative: 10 %
Neutro Abs: 2.6 10*3/uL (ref 1.7–7.7)
Neutrophils Relative %: 70 %
Platelets: 104 10*3/uL — ABNORMAL LOW (ref 150–400)
RBC: 2.17 MIL/uL — ABNORMAL LOW (ref 3.87–5.11)
RDW: 22.5 % — ABNORMAL HIGH (ref 11.5–15.5)
WBC: 3.8 10*3/uL — ABNORMAL LOW (ref 4.0–10.5)
nRBC: 0 % (ref 0.0–0.2)

## 2019-09-20 LAB — BASIC METABOLIC PANEL
Anion gap: 16 — ABNORMAL HIGH (ref 5–15)
BUN: 48 mg/dL — ABNORMAL HIGH (ref 6–20)
CO2: 23 mmol/L (ref 22–32)
Calcium: 8.8 mg/dL — ABNORMAL LOW (ref 8.9–10.3)
Chloride: 90 mmol/L — ABNORMAL LOW (ref 98–111)
Creatinine, Ser: 6.49 mg/dL — ABNORMAL HIGH (ref 0.44–1.00)
GFR calc Af Amer: 8 mL/min — ABNORMAL LOW (ref >60)
GFR calc non Af Amer: 7 mL/min — ABNORMAL LOW (ref >60)
Glucose, Bld: 187 mg/dL — ABNORMAL HIGH (ref 70–99)
Potassium: 4.8 mmol/L (ref 3.5–5.1)
Sodium: 129 mmol/L — ABNORMAL LOW (ref 135–145)

## 2019-09-20 LAB — GLUCOSE, CAPILLARY
Glucose-Capillary: 199 mg/dL — ABNORMAL HIGH (ref 70–99)
Glucose-Capillary: 227 mg/dL — ABNORMAL HIGH (ref 70–99)
Glucose-Capillary: 235 mg/dL — ABNORMAL HIGH (ref 70–99)

## 2019-09-20 LAB — PREPARE RBC (CROSSMATCH)

## 2019-09-20 MED ORDER — MAGNESIUM CITRATE PO SOLN: 1.0000 | Freq: Every day | ORAL | Status: DC | PRN

## 2019-09-20 MED ORDER — SODIUM CHLORIDE 0.9% IV SOLUTION: Freq: Once | INTRAVENOUS | Status: DC

## 2019-09-20 MED ORDER — LACTULOSE 10 GM/15ML PO SOLN
20.0000 g | Freq: Three times a day (TID) | ORAL | Status: DC
  Filled 2019-09-20 (×2): qty 30

## 2019-09-20 MED ORDER — SODIUM CHLORIDE 0.9 % IV SOLN: 100.0000 mL | INTRAVENOUS | Status: DC | PRN

## 2019-09-20 MED ORDER — ALTEPLASE 2 MG IJ SOLR: 2.0000 mg | Freq: Once | INTRAMUSCULAR | Status: DC | PRN

## 2019-09-20 MED ORDER — MAGNESIUM CITRATE PO SOLN: 1.0000 | Freq: Once | ORAL | Status: DC

## 2019-09-20 MED ORDER — PRO-STAT SUGAR FREE PO LIQD
30.0000 mL | Freq: Two times a day (BID) | ORAL | Status: DC
  Filled 2019-09-20 (×3): qty 30

## 2019-09-20 MED ORDER — SORBITOL 70 % SOLN: 30.0000 mL | Freq: Every day | Status: DC | PRN

## 2019-09-20 MED ORDER — LABETALOL HCL 200 MG PO TABS
200.0000 mg | ORAL_TABLET | Freq: Three times a day (TID) | ORAL | Status: DC
  Administered 2019-09-20 – 2019-09-21 (×3): 200 mg via ORAL
  Filled 2019-09-20: qty 1
  Filled 2019-09-20: qty 2
  Filled 2019-09-20: qty 1
  Filled 2019-09-20: qty 2
  Filled 2019-09-20: qty 1
  Filled 2019-09-20: qty 2
  Filled 2019-09-20 (×2): qty 1

## 2019-09-20 MED ORDER — CALCITRIOL 0.5 MCG PO CAPS
ORAL_CAPSULE | ORAL | Status: AC
  Administered 2019-09-20: 3 ug via ORAL
  Filled 2019-09-20: qty 6

## 2019-09-20 MED ORDER — HEPARIN SODIUM (PORCINE) 1000 UNIT/ML DIALYSIS
1000.0000 [IU] | INTRAMUSCULAR | Status: DC | PRN
  Filled 2019-09-20: qty 1

## 2019-09-20 MED ORDER — PENTAFLUOROPROP-TETRAFLUOROETH EX AERO: 1.0000 "application " | INHALATION_SPRAY | CUTANEOUS | Status: DC | PRN

## 2019-09-20 MED ORDER — LIDOCAINE HCL (PF) 1 % IJ SOLN: 5.0000 mL | INTRAMUSCULAR | Status: DC | PRN

## 2019-09-20 MED ORDER — LIDOCAINE-PRILOCAINE 2.5-2.5 % EX CREA: 1.0000 "application " | TOPICAL_CREAM | CUTANEOUS | Status: DC | PRN

## 2019-09-20 NOTE — Progress Notes (Signed)
Patient ID: [name], female   DOB: 1966-07-03, 54 y.o.   MRN: [id number]  PROGRESS NOTE    [name]  DJS:[id number] DOB: [birth year] DOA: 09/16/2019 PCP: Rocco Serene, MD   Brief Narrative:  54 year old female with history of ESRD on hemodialysis, recurrent GI bleed with multiple recurrent admissions with last admission in January 2021 when she underwent EGD and colonoscopy and had ablation of the angiodysplasia in the colon presented with rectal bleeding.  Hemoglobin was 4.1 admission.  GI was consulted.  Nephrology was also consulted.  She was transfused 2 units packed red cells.  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  Assessment & Plan:   Acute GI bleeding, most likely lower GI bleeding in a patient with history of recurrent GI bleeding and angiodysplasia of colon Acute on chronic blood loss anemia -Patient has had recurrent hospitalization for the same.  Patient has a history of being Jehovah's Witness but has agreed to blood transfusion during recent admissions.  Status post 3 units packed red cells transfusion during this hospitalization.  Hemoglobin 6.9 today.  Transfuse 1 unit of packed red cells today.  Monitor H&H. -GI has signed off.  If continues to have rectal bleeding, GI recommends bleeding scan. -No bowel movement since admission.  Constipation -No bowel movement since admission.  Currently on Senokot.  Increase MiraLAX to twice a day.  Add lactulose orally as well.  Patient received soapsuds enema yesterday but without any bowel movement yet.  Will give her tapwater enema today.  X-ray of the abdomen on 09/19/2019 did not show any signs of obstruction.  End-stage renal disease on hemodialysis -Nephrology following.  Dialysis as per nephrology schedule  Diabetes mellitus type 2 with hyperglycemia -Continue CBGs with SSI  Hypertension -Blood pressure still elevated.  Continue amlodipine and  hydralazine.  Increase labetalol to 200 mg 3 times a day.  Generalized conditioning -Patient has requested to speak to palliative care regarding goals of care.  Palliative care consult has been placed.  DVT prophylaxis: SCDs Code Status: Full Family Communication: Spoke to patient at bedside Disposition Plan: Home in 1 to 2 days once hemoglobin remained stable. Consultants: GI/nephrology.  Requested palliative care evaluation  Procedures: None  Antimicrobials: None   Subjective: Patient seen and examined at bedside undergoing dialysis.  She is a poor historian.  States that she has not had a bowel movement yet despite enema yesterday and is requesting for another enema today.  Does not feel well but feels better than yesterday.  No overnight fever.   Objective: Vitals:   09/20/19 0800 09/20/19 0830 09/20/19 0900 09/20/19 0930  BP: (!) 181/92 (!) 188/90 (!) 183/95 (!) 177/76  Pulse: 76 76 87 84  Resp:      Temp:      TempSrc:      SpO2:      Weight:      Height:        Intake/Output Summary (Last 24 hours) at 09/20/2019 1010 Last data filed at 09/20/2019 0443 Gross per 24 hour  Intake 360 ml  Output --  Net 360 ml   Filed Weights   09/18/19 0446 09/20/19 0500 09/20/19 0704  Weight: 70.1 kg 75.4 kg 73.6 kg    Examination:  General exam: No acute distress.  Poor historian.   Respiratory system: Bilateral decreased breath sounds at bases with scattered crackles.  No wheezing  cardiovascular system: Rate controlled, S1-S2  heard Gastrointestinal system: Abdomen is nondistended, soft and nontender.  Sluggish bowel sounds.   Extremities: No cyanosis; trace lower and upper extremity edema present bilaterally   Data Reviewed: I have personally reviewed following labs and imaging studies  CBC: Recent Labs  Lab 09/16/19 0229 09/16/19 0229 09/16/19 2019 09/17/19 0727 09/18/19 0743 09/19/19 0227 09/20/19 0827  WBC 5.7  --   --  6.8 5.5 4.2 3.8*  NEUTROABS  --   --    --  5.2 4.1 3.0 2.6  HGB 4.1*   < > 6.6* 7.4* 7.4* 7.5* 6.9*  HCT 13.8*   < > 20.5* 22.9* 23.5* 23.2* 22.1*  MCV 116.9*  --   --  98.3 101.3* 101.3* 101.8*  PLT 194  --   --  148* 123* 116* 104*   < > = values in this interval not displayed.   Basic Metabolic Panel: Recent Labs  Lab 09/16/19 0111 09/17/19 0727 09/18/19 0743 09/19/19 0227 09/20/19 0827  NA 138 136 134* 131* 129*  K 4.3 4.2 3.7 3.9 4.8  CL 95* 93* 94* 91* 90*  CO2 29 30 29 26 23   GLUCOSE 218* 111* 218* 239* 187*  BUN 20 37* 24* 36* 48*  CREATININE 3.64* 5.11* 3.81* 5.00* 6.49*  CALCIUM 8.7* 9.1 9.2 8.8* 8.8*   GFR: Estimated Creatinine Clearance: 9.9 mL/min (A) (by C-G formula based on SCr of 6.49 mg/dL (H)). Liver Function Tests: Recent Labs  Lab 09/16/19 0111  AST 46*  ALT 35  ALKPHOS 76  BILITOT 0.4  PROT 5.7*  ALBUMIN 2.7*   No results for input(s): LIPASE, AMYLASE in the last 168 hours. No results for input(s): AMMONIA in the last 168 hours. Coagulation Profile: Recent Labs  Lab 09/16/19 0111  INR 1.3*   Cardiac Enzymes: No results for input(s): CKTOTAL, CKMB, CKMBINDEX, TROPONINI in the last 168 hours. BNP (last 3 results) No results for input(s): PROBNP in the last 8760 hours. HbA1C: No results for input(s): HGBA1C in the last 72 hours. CBG: Recent Labs  Lab 09/18/19 1936 09/19/19 0634 09/19/19 1146 09/19/19 1835 09/19/19 2054  GLUCAP 170* 191* 186* 223* 169*   Lipid Profile: No results for input(s): CHOL, HDL, LDLCALC, TRIG, CHOLHDL, LDLDIRECT in the last 72 hours. Thyroid Function Tests: No results for input(s): TSH, T4TOTAL, FREET4, T3FREE, THYROIDAB in the last 72 hours. Anemia Panel: No results for input(s): VITAMINB12, FOLATE, FERRITIN, TIBC, IRON, RETICCTPCT in the last 72 hours. Sepsis Labs: No results for input(s): PROCALCITON, LATICACIDVEN in the last 168 hours.  Recent Results (from the past 240 hour(s))  Respiratory Panel by RT PCR (Flu A&B, Covid) -  Nasopharyngeal Swab     Status: None   Collection Time: 09/16/19  1:25 AM   Specimen: Nasopharyngeal Swab  Result Value Ref Range Status   SARS Coronavirus 2 by RT PCR NEGATIVE NEGATIVE Final    Comment: (NOTE) SARS-CoV-2 target nucleic acids are NOT DETECTED. The SARS-CoV-2 RNA is generally detectable in upper respiratoy specimens during the acute phase of infection. The lowest concentration of SARS-CoV-2 viral copies this assay can detect is 131 copies/mL. A negative result does not preclude SARS-Cov-2 infection and should not be used as the sole basis for treatment or other patient management decisions. A negative result may occur with  improper specimen collection/handling, submission of specimen other than nasopharyngeal swab, presence of viral mutation(s) within the areas targeted by this assay, and inadequate number of viral copies (<131 copies/mL). A negative result must be combined with  clinical observations, patient history, and epidemiological information. The expected result is Negative. Fact Sheet for Patients:  PinkCheek.be Fact Sheet for Healthcare Providers:  GravelBags.it This test is not yet ap proved or cleared by the [place] FDA and  has been authorized for detection and/or diagnosis of SARS-CoV-2 by FDA under an Emergency Use Authorization (EUA). This EUA will remain  in effect (meaning this test can be used) for the duration of the COVID-19 declaration under Section 564(b)(1) of the Act, 21 U.S.C. section 360bbb-3(b)(1), unless the authorization is terminated or revoked sooner.    Influenza A by PCR NEGATIVE NEGATIVE Final   Influenza B by PCR NEGATIVE NEGATIVE Final    Comment: (NOTE) The Xpert Xpress SARS-CoV-2/FLU/RSV assay is intended as an aid in  the diagnosis of influenza from Nasopharyngeal swab specimens and  should not be used as a sole basis for treatment. Nasal washings and  aspirates  are unacceptable for Xpert Xpress SARS-CoV-2/FLU/RSV  testing. Fact Sheet for Patients: PinkCheek.be Fact Sheet for Healthcare Providers: GravelBags.it This test is not yet approved or cleared by the [place] FDA and  has been authorized for detection and/or diagnosis of SARS-CoV-2 by  FDA under an Emergency Use Authorization (EUA). This EUA will remain  in effect (meaning this test can be used) for the duration of the  Covid-19 declaration under Section 564(b)(1) of the Act, 21  U.S.C. section 360bbb-3(b)(1), unless the authorization is  terminated or revoked. Performed at [facility], [place] [address] [place], [place] [address]   MRSA PCR Screening     Status: Abnormal   Collection Time: 09/19/19 10:25 AM   Specimen: Nasopharyngeal  Result Value Ref Range Status   MRSA by PCR POSITIVE (A) NEGATIVE Final    Comment:        The GeneXpert MRSA Assay (FDA approved for NASAL specimens only), is one component of a comprehensive MRSA colonization surveillance program. It is not intended to diagnose MRSA infection nor to guide or monitor treatment for MRSA infections. RESULT CALLED TO, READ BACK BY AND VERIFIED WITH: BECKNER ON [id number] AT 1440 BY NFIELDS Performed at [facility], [place] [address], [place], [place] [address]          Radiology Studies: DG Abd 1 View  Result Date: 09/19/2019 CLINICAL DATA:  Constipation, nausea and vomiting for 4 days. EXAM: ABDOMEN - 1 VIEW COMPARISON:  May 13, 2019 FINDINGS: There is no bowel obstruction. Relative paucity of bowel gas is noted. Moderate bowel content is identified in the visualized colon. IMPRESSION: No bowel obstruction. Moderate bowel content identified in the visualized colon. Electronically Signed   By: Abelardo Diesel M.D.   On: 09/19/2019 09:02   MR BRAIN WO CONTRAST  Result Date: 09/19/2019 CLINICAL DATA:  Neuro deficit, acute, stroke suspected.  Additional history provided: 54 year old female with history of ESRD on hemodialysis with recent GI bleed from angio dysplasia. EXAM: MRI HEAD WITHOUT CONTRAST TECHNIQUE: Multiplanar, multiecho pulse sequences of the brain and surrounding structures were obtained without intravenous contrast. COMPARISON:  Brain MRI 06/04/2017 FINDINGS: Brain: There is no evidence of acute infarct. No evidence of intracranial mass. No midline shift or extra-axial fluid collection. No chronic intracranial blood products. Patchy and confluent T2/FLAIR hyperintensity within the cerebral white matter and pons appears somewhat progressed from prior examination 06/04/2017. Findings are nonspecific, but consistent with chronic small vessel ischemic disease. Redemonstrated prominent perivascular space within the left lentiform nucleus. Stable cerebral volume including prominent perisylvian volume loss.  Vascular: Flow voids maintained within the proximal large arterial vessels. Skull and upper cervical spine: Redemonstrated diffuse T1 hypointense marrow signal throughout the visualized cervical spine. No focal suspicious osseous lesion. Sinuses/Orbits: Visualized orbits demonstrate no acute abnormality. No significant paranasal sinus disease or mastoid effusion at the imaged levels. IMPRESSION: No evidence of acute intracranial abnormality, including acute infarction. Chronic small vessel ischemic disease within the cerebral white matter and pons has somewhat progressed since prior MRI 06/04/2017. Stable cerebral volume loss including prominent bilateral perisylvian volume loss. 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 Electronically Signed   By: Kellie Simmering DO   On: 09/19/2019 17:59        Scheduled Meds: . sodium chloride   Intravenous Once  . sodium chloride   Intravenous Once  . amLODipine  10 mg Oral Daily  . calcitRIOL  3 mcg Oral Q  M,W,F-HD  . Chlorhexidine Gluconate Cloth  6 each Topical Q0600  . feeding supplement (PRO-STAT SUGAR FREE 64)  30 mL Oral BID  . ferric citrate  420 mg Oral TID WC  . hydrALAZINE  10 mg Intravenous Once  . hydrALAZINE  50 mg Oral TID  . insulin aspart  0-9 Units Subcutaneous TID WC & HS  . labetalol  200 mg Oral BID  . lactulose  20 g Oral TID  . mupirocin ointment  1 application Nasal BID  . pantoprazole (PROTONIX) IV  40 mg Intravenous Q12H  . polyethylene glycol  17 g Oral BID  . senna-docusate  1 tablet Oral BID   Continuous Infusions: . [START ON 09/21/2019] sodium chloride    . [START ON 09/21/2019] sodium chloride    . [START ON 09/22/2019] ferric gluconate (FERRLECIT/NULECIT) IV            Aline August, MD [facility] 09/20/2019, 10:10 AM

## 2019-09-20 NOTE — Consult Note (Signed)
Consultation Note Date: 09/20/2019   Patient Name: [name]  DOB: 05/07/1966  MRN: [id number]  Age / Sex: 54 y.o., female  PCP: Rocco Serene, MD Referring Physician: Aline August, MD  Reason for Consultation: Establishing goals of care  HPI/Patient Profile: 54 y.o. female  with past medical history of ESRD on HD, recurrent GI bleed, CHF, HTN, diabetes, chronic pain, bipolar disorder admitted on 09/16/2019 with recurrent rectal bleeding with known angiodysplasia of colon.   Clinical Assessment and Goals of Care: I met today with [name] who I have met in the past and she has met multiple times with palliative care in the past. She has had multiple admissions previously with GIB and has expressed struggles with her desire to live and receive blood products but has deep guilt for this decision as she feels that she has gone against her religion - she expresses this to me today. She feels that she is spiritually in a good place right now. She has a clear desire to return home and tells me that the next time she bleeds she wants to remain at home and she does NOT want to return to the hospital. She does NOT want to receive further blood products in the future. She feels when she is in the hospital she is constantly told that without blood she will die and ultimately gives in. She does not want to continue in this pattern. She wants to die at home and not in a hospital.   She would currently like to continue dialysis even though she questions this often if dialysis is continuing to give her the QOL that she desires. She has often questioned if dialysis continues to benefit her. We discussed that ideally she could have support from hospice to help keep her comfortable at home when she declines in the future but in the meantime allow her to continue some dialysis. She would like to pursue this plan if possible.   She  also would like information on local body donation programs which I will get to her and has plans to work on writing her own obituary and picking out her pictures. She would like to have all her affairs in order. She does express frustration from not having the support that she has hoped from her family through all of this and opts not to involve them in her decisions and plans now. She alludes to making poor decisions in the past but finally feels "I am at peace with Jehovah" and is ready to make that transition to comfort next time she declines.   All questions/concerns addressed. Emotional support provided.   Primary Decision Maker PATIENT    SUMMARY OF RECOMMENDATIONS   - She would like to continue dialysis for now but does not desire rehospitalization or future blood transfusion but would like to be made comfortable and die at home  Code Status/Advance Care Planning:  Full code - will confirm desire for DNR tomorrow at my follow up   Symptom Management:   Per  primary  Palliative Prophylaxis:   Bowel Regimen and Frequent Pain Assessment  Psycho-social/Spiritual:   Desire for further Chaplaincy support:yes  Additional Recommendations: Caregiving  Support/Resources, Education on Hospice and Grief/Bereavement Support  Prognosis:   < 6 months if no plans for transfusion or hospitalization in the future  Discharge Planning: Home with Hospice      Primary Diagnoses: Present on Admission: . Acute GI bleeding . Acute blood loss anemia . External hemorrhoids   I have reviewed the medical record, interviewed the patient and family, and examined the patient. The following aspects are pertinent.  Past Medical History:  Diagnosis Date  . Anemia   . Anxiety   . Bipolar disorder, unspecified ([place]) 02/06/2014  . Chronic combined systolic and diastolic CHF (congestive heart failure) (HCC)    EF 40% by echo and 48% by stress test  . Chronic pain syndrome   . Depression   .  ESRD on hemodialysis (H[facility])    TTS  . GERD (gastroesophageal reflux disease)   . Glaucoma   . Headache    sinus headaches  . Hepatitis C   . High cholesterol   . History of hiatal hernia   . History of vitamin D deficiency 06/20/2015  . Hypertension   . MRSA infection ?2014   "on the back of my head; spread throughout my bloodstream"  . Neuropathy   . Rectal bleeding 09/16/2019  . Refusal of blood transfusions as patient is Jehovah's Witness    occasionally allows transfusions.    . Schizoaffective disorder, unspecified condition 02/08/2014  . Seizure [facility]) dx'd 2014   "don't know what kind; last one was ~ 04/2014"  . TIA (transient ischemic attack) 2010  . Type II diabetes mellitus ([place])    INSULIN DEPENDENT   Social History   Socioeconomic History  . Marital status: Single    Spouse name: Not on file  . Number of children: 3  . Years of education: 66  . Highest education level: 12th grade  Occupational History  . Occupation: disabled  Tobacco Use  . Smoking status: Current Some Day Smoker    Packs/day: 1.00    Years: 36.00    Pack years: 36.00    Types: Cigarettes  . Smokeless tobacco: Never Used  . Tobacco comment: 5 cigs in one setting  Substance and Sexual Activity  . Alcohol use: Not Currently    Alcohol/week: 3.0 standard drinks    Types: 3 Standard drinks or equivalent per week  . Drug use: Not Currently    Types: "Crack" cocaine, Cocaine    Comment: last use >2 years ago  . Sexual activity: Not Currently    Birth control/protection: Abstinence    Comment: offered condoms 05/2019  Other Topics Concern  . Not on file  Social History Narrative   ** Merged History Encounter **   Single, lives with son in a one story home. Unable to exercise. Avoids caffeine. 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       Social Determinants of Health   Financial Resource Strain:   . Difficulty of Paying Living Expenses: Not on  file  Food Insecurity:   . Worried About Charity fundraiser in the Last Year: Not on file  . Ran Out of Food in the Last Year: Not on file  Transportation Needs:   . Lack of Transportation (Medical): Not on file  . Lack of Transportation (Non-Medical): Not on file  Physical Activity:   .  Days of Exercise per Week: Not on file  . Minutes of Exercise per Session: Not on file  Stress:   . Feeling of Stress : Not on file  Social Connections:   . Frequency of Communication with Friends and Family: Not on file  . Frequency of Social Gatherings with Friends and Family: Not on file  . Attends Religious Services: Not on file  . Active Member of Clubs or Organizations: Not on file  . Attends Archivist Meetings: Not on file  . Marital Status: Not on file   Family History  Problem Relation Age of Onset  . Hypertension Mother   . Diabetes Mother   . Hypertension Father   . Diabetes Father    Scheduled Meds: . sodium chloride   Intravenous Once  . sodium chloride   Intravenous Once  . amLODipine  10 mg Oral Daily  . calcitRIOL  3 mcg Oral Q M,W,F-HD  . Chlorhexidine Gluconate Cloth  6 each Topical Q0600  . feeding supplement (PRO-STAT SUGAR FREE 64)  30 mL Oral BID  . ferric citrate  420 mg Oral TID WC  . hydrALAZINE  10 mg Intravenous Once  . hydrALAZINE  50 mg Oral TID  . insulin aspart  0-9 Units Subcutaneous TID WC & HS  . labetalol  200 mg Oral TID  . lactulose  20 g Oral TID  . mupirocin ointment  1 application Nasal BID  . pantoprazole (PROTONIX) IV  40 mg Intravenous Q12H  . polyethylene glycol  17 g Oral BID  . senna-docusate  1 tablet Oral BID   Continuous Infusions: . [START ON 09/22/2019] ferric gluconate (FERRLECIT/NULECIT) IV     PRN Meds:.acetaminophen **OR** acetaminophen, albuterol, bisacodyl, labetalol, morphine injection, ondansetron **OR** ondansetron (ZOFRAN) IV, sorbitol Allergies  Allergen Reactions  . Penicillins Hives and Other (See Comments)      Has patient had a PCN reaction causing immediate rash, facial/tongue/throat swelling, SOB or lightheadedness with hypotension: Yes Has patient had a PCN reaction causing severe rash involving mucus membranes or skin necrosis: No Has patient had a PCN reaction that required hospitalization No Has patient had a PCN reaction occurring within the last 10 years: No If all of the above answers are "NO", then may proceed with Cephalosporin use. Pt tolerates cefepime and ceftriaxone  . Gabapentin Itching and Swelling  . Lyrica [Pregabalin] Swelling and Other (See Comments)    Facial and leg swelling  . Saphris [Asenapine] Swelling and Other (See Comments)    Facial swelling  . Tramadol Swelling and Other (See Comments)    Leg swelling  . Adhesive [Tape] Itching    SEVERE ITCHING  . Latex Itching    SEVERE ITCHING  . Other Other (See Comments)    NO BLOOD PRODUCTS; PATIENT IS A JEHOVAH'S WITNESS   Review of Systems  Constitutional: Positive for activity change.  Gastrointestinal: Positive for blood in stool.    Physical Exam Vitals and nursing note reviewed.  Constitutional:      General: She is not in acute distress.    Appearance: Normal appearance.  Cardiovascular:     Rate and Rhythm: Normal rate.  Pulmonary:     Effort: Pulmonary effort is normal. No tachypnea, accessory muscle usage or respiratory distress.  Abdominal:     General: Abdomen is flat.  Neurological:     Mental Status: She is alert and oriented to person, place, and time.     Vital Signs: BP (!) 172/81 (BP Location: Right  Wrist)   Pulse 82   Temp 97.8 F (36.6 C) (Oral)   Resp 18   Ht '5\' 4"'$  (1.626 m)   Wt 71.2 kg   LMP  (LMP Unknown)   SpO2 97%   BMI 26.94 kg/m  Pain Scale: 0-10 POSS *See Group Information*: 1-Acceptable,Awake and alert Pain Score: 0-No pain   SpO2: SpO2: 97 % O2 Device:SpO2: 97 % O2 Flow Rate: .O2 Flow Rate (L/min): 1 L/min  IO: Intake/output summary:   Intake/Output  Summary (Last 24 hours) at 09/20/2019 1512 Last data filed at 09/20/2019 1129 Gross per 24 hour  Intake 675 ml  Output 2899 ml  Net -2224 ml    LBM: Last BM Date: 09/19/19 Baseline Weight: Weight: 75.8 kg Most recent weight: Weight: 71.2 kg     Palliative Assessment/Data:     Time In: 1500 Time Out: 1600 Time Total: 60 min Greater than 50%  of this time was spent counseling and coordinating care related to the above assessment and plan.  Signed by: Vinie Sill, NP Palliative Medicine Team Pager # [phone] (M-F 8a-5p) Team Phone # [phone] (Nights/Weekends)

## 2019-09-20 NOTE — Progress Notes (Signed)
Pt finally had a bowel movement this evening.

## 2019-09-20 NOTE — Progress Notes (Signed)
Spoke with [name] this morning. She expressed a desire to leave the hospital. I suggest that a palliative consult be put in and consultation with her Physician.   Rev. [place]. M. Div.

## 2019-09-20 NOTE — Evaluation (Signed)
Physical Therapy Evaluation Patient Details Name: [name] MRN: [id number] DOB: [birth year] Today's Date: 09/20/2019   History of Present Illness  pt is a 54 yo female admitted for acute GI bleed has some melanotic component, admitted in Dec when pt had RBC scan and angiogram showing right colonic angiodysplasia, admitted jan 2021 for colonoscopy and EGD with ablation of angiodysplasia in colon.  PMH includes ESRD on HD, recurrent GI bleed, bipolar disorder, CHF, HTN, schizoaffective disorder, DM, TIA  Clinical Impression  Pt is a 54 yo female admitted for above. Pt up walking within room with nursing present upon arrival. Pt agreeable to PT eval. Pt reports living alone in a one level apartment with 4 steps to enter with bil handrails. Pt reports having a recent fall in the parking lot of her apartment and was unable to get up off the ground without assistance. Pt mod I for bed mobility, supervision assist for transfers and min guard assist for ambulation. Pt ambulating within room without AD however mild unsteadiness noted. Pt reports having a RW from last admission but wants a rollator. Suspect pt would have improved stability with RW if she would use consistently. Pt with increased unsteadiness with vertical head turns during ambulation and increased dizziness and unsteadiness with horizontal head turns. Pt reporting past history of vertigo that did not get treated. Pt able to maintain static standing balance with narrow BOS and no UE support however noted sig increased sway. Pt unable to balance without UE in semi tandem stance. Pt presents with dec strength, balance, safety awareness, and endurance limiting functional mobility. Pt not interested in STR following acute hospitalization. Pt agreeable and interested in HHPT. Pt reporting balance is her biggest mobility concern. Pt would benefit from further acute PT to improve noted deficits, further assess balance deficits and improve mobility.  Recommendation for HHPT following hospital d/c to further improve deficits, increase independence and decrease fall risk.     Follow Up Recommendations Home health PT    Equipment Recommendations  None recommended by PT(pt has RW, BSC)    Recommendations for Other Services       Precautions / Restrictions Precautions Precautions: Fall Restrictions Weight Bearing Restrictions: No      Mobility  Bed Mobility Overal bed mobility: Modified Independent             General bed mobility comments: mild use of bed rails  Transfers Overall transfer level: Needs assistance Equipment used: None Transfers: Sit to/from Stand Sit to Stand: Supervision         General transfer comment: supervision for safety, mild unsteadiness with transers, multiple trials from bed, suspect would be more stable with RW  Ambulation/Gait Ambulation/Gait assistance: Min guard Gait Distance (Feet): 50 Feet Assistive device: None Gait Pattern/deviations: Step-through pattern;Decreased stride length Gait velocity: dec   General Gait Details: pt ambulated within room back and forth without AD, mildly dec gait speed, unsteadiness noted however no overt LOB, pt able to ambulated with vertical head turns, dizziness reported with horizontal head turns with pt reporting past hx of vertigo, increased unsteadiness noted with head turns  Stairs            Wheelchair Mobility    Modified Rankin (Stroke Patients Only)       Balance Overall balance assessment: History of Falls;Needs assistance   Sitting balance-Leahy Scale: Good       Standing balance-Leahy Scale: Fair Standing balance comment: able to ambulate without UE support however mildly unsteady  High Level Balance Comments: pt able to maintain static balance in narrow BOS without UE support however mod increased sway noted, unable to maintain static standing balance in semi tandem without UE support              Pertinent Vitals/Pain Pain Assessment: Faces Faces Pain Scale: Hurts a little bit Pain Location: headache Pain Descriptors / Indicators: Headache Pain Intervention(s): Limited activity within patient's tolerance;Monitored during session    Home Living Family/patient expects to be discharged to:: Private residence Living Arrangements: Alone Available Help at Discharge: Family;Available PRN/intermittently Type of Home: Apartment Home Access: Stairs to enter Entrance Stairs-Rails: Can reach both Entrance Stairs-Number of Steps: 4 Home Layout: One level Home Equipment: Walker - 2 wheels;Bedside commode;Cane - single point      Prior Function Level of Independence: Independent         Comments: pt reports being indendent with all ADLs, recent fall in parking lot of apartment unable to get up ind, reports ambulating without AD within the home because she can furniture surf, unsure of device use in community, pt poor historian per chart review     Hand Dominance   Dominant Hand: Left    Extremity/Trunk Assessment   Upper Extremity Assessment Upper Extremity Assessment: Overall WFL for tasks assessed    Lower Extremity Assessment Lower Extremity Assessment: Generalized weakness(grossly 3+/5)       Communication   Communication: No difficulties  Cognition Arousal/Alertness: Awake/alert Behavior During Therapy: WFL for tasks assessed/performed Overall Cognitive Status: Within Functional Limits for tasks assessed                                        General Comments      Exercises Total Joint Exercises Hip ABduction/ADduction: AROM;Both;10 reps Straight Leg Raises: AROM;Both;10 reps Long Arc Quad: AROM;Both;10 reps   Assessment/Plan    PT Assessment Patient needs continued PT services  PT Problem List Decreased strength;Decreased mobility;Decreased safety awareness;Decreased range of motion;Decreased activity tolerance;Decreased  balance;Decreased knowledge of use of DME       PT Treatment Interventions DME instruction;Gait training;Balance training;Therapeutic exercise;Stair training;Neuromuscular re-education;Functional mobility training;Therapeutic activities;Patient/family education    PT Goals (Current goals can be found in the Care Plan section)  Acute Rehab PT Goals Patient Stated Goal: improve balance and go home PT Goal Formulation: With patient Time For Goal Achievement: 10/11/19 Potential to Achieve Goals: Fair    Frequency Min 3X/week   Barriers to discharge Decreased caregiver support      Co-evaluation               AM-PAC PT "6 Clicks" Mobility  Outcome Measure Help needed turning from your back to your side while in a flat bed without using bedrails?: None Help needed moving from lying on your back to sitting on the side of a flat bed without using bedrails?: A Little Help needed moving to and from a bed to a chair (including a wheelchair)?: A Little Help needed standing up from a chair using your arms (e.g., wheelchair or bedside chair)?: A Little Help needed to walk in hospital room?: A Little Help needed climbing 3-5 steps with a railing? : A Lot 6 Click Score: 18    End of Session Equipment Utilized During Treatment: Gait belt Activity Tolerance: Patient tolerated treatment well Patient left: in bed;with call [place]/phone within reach;with nursing/sitter in room Nurse Communication: Mobility status PT  Visit Diagnosis: Unsteadiness on feet (R26.81);Other abnormalities of gait and mobility (R26.89);Muscle weakness (generalized) (M62.81);History of falling (Z91.81)    Time: 0347-4259 PT Time Calculation (min) (ACUTE ONLY): 21 min   Charges:   PT Evaluation $PT Eval Moderate Complexity: 1 Mod PT Treatments $Therapeutic Activity: 8-22 mins        Lenville Hibberd PT, DPT 1:40 PM,09/20/19   Evadean Sproule M Sherryn Pollino 09/20/2019, 1:33 PM

## 2019-09-20 NOTE — Progress Notes (Signed)
PT Cancellation Note  Pt currently off the unit for dialysis and not available for PT eval at this time. PT will follow up this afternoon for PT eval as able and pt appropriate.   Zachary George PT, DPT 11:39 AM,09/20/19

## 2019-09-20 NOTE — Progress Notes (Addendum)
[facility] Progress Note   Subjective:  Seen on HD - BP remains high. UF goal set for 2L -> increased to 2.5L. Wearing O2, thinks R side of face is swollen - looks bilateral to me. No CP. Dark stool overnight.  Objective Vitals:   09/20/19 0704 09/20/19 0714 09/20/19 0730 09/20/19 0800  BP: 135/90 (!) 166/90 (!) 177/89 (!) 181/92  Pulse: 74 75 77 76  Resp: 16     Temp: (!) 97.4 F (36.3 C)     TempSrc: Oral     SpO2: 100%     Weight: 73.6 kg     Height:       Physical Exam General: Well appearing woman. Nasal O2 in place. B periorbital edema Heart: RRR; 2/6 murmur Lungs: CTA anteriorly Abdomen: soft, non-tender Extremities: Trace BLE edema Dialysis Access: LUE AVG + bruit  Additional Objective Labs: Basic Metabolic Panel: Recent Labs  Lab 09/17/19 0727 09/18/19 0743 09/19/19 0227  NA 136 134* 131*  K 4.2 3.7 3.9  CL 93* 94* 91*  CO2 30 29 26   GLUCOSE 111* 218* 239*  BUN 37* 24* 36*  CREATININE 5.11* 3.81* 5.00*  CALCIUM 9.1 9.2 8.8*   Liver Function Tests: Recent Labs  Lab 09/16/19 0111  AST 46*  ALT 35  ALKPHOS 76  BILITOT 0.4  PROT 5.7*  ALBUMIN 2.7*   CBC: Recent Labs  Lab 09/16/19 0229 09/16/19 2019 09/17/19 0727 09/18/19 0743 09/19/19 0227  WBC 5.7  --  6.8 5.5 4.2  NEUTROABS  --   --  5.2 4.1 3.0  HGB 4.1*   < > 7.4* 7.4* 7.5*  HCT 13.8*   < > 22.9* 23.5* 23.2*  MCV 116.9*  --  98.3 101.3* 101.3*  PLT 194  --  148* 123* 116*   < > = values in this interval not displayed.   CBG: Recent Labs  Lab 09/18/19 1936 09/19/19 0634 09/19/19 1146 09/19/19 1835 09/19/19 2054  GLUCAP 170* 191* 186* 223* 169*   Studies/Results: DG Abd 1 View  Result Date: 09/19/2019 CLINICAL DATA:  Constipation, nausea and vomiting for 4 days. EXAM: ABDOMEN - 1 VIEW COMPARISON:  May 13, 2019 FINDINGS: There is no bowel obstruction. Relative paucity of bowel gas is noted. Moderate bowel content is identified in the visualized colon.  IMPRESSION: No bowel obstruction. Moderate bowel content identified in the visualized colon. Electronically Signed   By: Abelardo Diesel M.D.   On: 09/19/2019 09:02   MR BRAIN WO CONTRAST  Result Date: 09/19/2019 CLINICAL DATA:  Neuro deficit, acute, stroke suspected. Additional history provided: 54 year old female with history of ESRD on hemodialysis with recent GI bleed from angio dysplasia. EXAM: MRI HEAD WITHOUT CONTRAST TECHNIQUE: Multiplanar, multiecho pulse sequences of the brain and surrounding structures were obtained without intravenous contrast. COMPARISON:  Brain MRI 06/04/2017 FINDINGS: Brain: There is no evidence of acute infarct. No evidence of intracranial mass. No midline shift or extra-axial fluid collection. No chronic intracranial blood products. Patchy and confluent T2/FLAIR hyperintensity within the cerebral white matter and pons appears somewhat progressed from prior examination 06/04/2017. Findings are nonspecific, but consistent with chronic small vessel ischemic disease. Redemonstrated prominent perivascular space within the left lentiform nucleus. Stable cerebral volume including prominent perisylvian volume loss. Vascular: Flow voids maintained within the proximal large arterial vessels. Skull and upper cervical spine: Redemonstrated diffuse T1 hypointense marrow signal throughout the visualized cervical spine. No focal suspicious osseous lesion. Sinuses/Orbits: Visualized orbits demonstrate no acute abnormality. No significant paranasal  sinus disease or mastoid effusion at the imaged levels. IMPRESSION: No evidence of acute intracranial abnormality, including acute infarction. Chronic small vessel ischemic disease within the cerebral white matter and pons has somewhat progressed since prior MRI 06/04/2017. Stable cerebral volume loss including prominent bilateral perisylvian volume loss. 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 Electronically Signed   By: Kellie Simmering DO   On: 09/19/2019 17:59   Medications: . [START ON 09/21/2019] sodium chloride    . [START ON 09/21/2019] sodium chloride    . [START ON 09/22/2019] ferric gluconate (FERRLECIT/NULECIT) IV     . sodium chloride   Intravenous Once  . amLODipine  10 mg Oral Daily  . calcitRIOL  3 mcg Oral Q M,W,F-HD  . Chlorhexidine Gluconate Cloth  6 each Topical Q0600  . ferric citrate  420 mg Oral TID WC  . hydrALAZINE  10 mg Intravenous Once  . hydrALAZINE  50 mg Oral TID  . insulin aspart  0-9 Units Subcutaneous TID WC & HS  . labetalol  200 mg Oral BID  . mupirocin ointment  1 application Nasal BID  . pantoprazole (PROTONIX) IV  40 mg Intravenous Q12H  . polyethylene glycol  17 g Oral BID  . senna-docusate  1 tablet Oral BID    Dialysis Orders: GO MWF 3.5h 71.5kg 2/2 bath Hep none LFA AVG  -Calcitriol 3.0 -Mircera 200 mcg q 2wks Last on 09/13/19 Venofer 50mg  qwk   Assessment/Plan: 1. GI bleeding -  recurrent issue (ABLA + anemia of ESRD): Hx angiodysplasia of colon. GI previously following - now has signed off.  Hgb 4.1 on admit -> s/p 3U PRBCs -> now stable ~7.5. Last ESA Mircera 200 on 2/8 - not due yet. On IV iron weekly. 2. ESRD: Continue HD per MWF schedule - HD today, 2.5L UFG. 3. HTN/volume: BP remains high, UF as tolerated. 4. Secondary HPTH: Ca ok, Phos pending. Contine Auryxia binder and VDRA. 5. Nutrition: Albumin low - start pro-stat supplements. 6. Constipation: . Senokot/Miralax ordered, s/p soap suds enema. Avoid Fleet enema.   ADDENDUM: Hgb down to 6.9 - asked patient if willing for transfusion. Initially said no, but later agreed - 1U PRBCs ordered.  Veneta Penton, PA-C 09/20/2019, 8:14 AM  [employer] Pager: [phone]

## 2019-09-21 LAB — BASIC METABOLIC PANEL
Anion gap: 13 (ref 5–15)
BUN: 32 mg/dL — ABNORMAL HIGH (ref 6–20)
CO2: 26 mmol/L (ref 22–32)
Calcium: 9.4 mg/dL (ref 8.9–10.3)
Chloride: 94 mmol/L — ABNORMAL LOW (ref 98–111)
Creatinine, Ser: 4.27 mg/dL — ABNORMAL HIGH (ref 0.44–1.00)
GFR calc Af Amer: 13 mL/min — ABNORMAL LOW (ref >60)
GFR calc non Af Amer: 11 mL/min — ABNORMAL LOW (ref >60)
Glucose, Bld: 215 mg/dL — ABNORMAL HIGH (ref 70–99)
Potassium: 4.4 mmol/L (ref 3.5–5.1)
Sodium: 133 mmol/L — ABNORMAL LOW (ref 135–145)

## 2019-09-21 LAB — GLUCOSE, CAPILLARY: Glucose-Capillary: 206 mg/dL — ABNORMAL HIGH (ref 70–99)

## 2019-09-21 LAB — TYPE AND SCREEN
ABO/RH(D): O POS
Antibody Screen: NEGATIVE
Unit division: 0

## 2019-09-21 LAB — BPAM RBC
Blood Product Expiration Date: 202103132359
ISSUE DATE / TIME: 202102151043
Unit Type and Rh: 5100

## 2019-09-21 LAB — CBC WITH DIFFERENTIAL/PLATELET
Abs Immature Granulocytes: 0.01 10*3/uL (ref 0.00–0.07)
Basophils Absolute: 0 10*3/uL (ref 0.0–0.1)
Basophils Relative: 0 %
Eosinophils Absolute: 0.1 10*3/uL (ref 0.0–0.5)
Eosinophils Relative: 2 %
HCT: 27.4 % — ABNORMAL LOW (ref 36.0–46.0)
Hemoglobin: 8.5 g/dL — ABNORMAL LOW (ref 12.0–15.0)
Immature Granulocytes: 0 %
Lymphocytes Relative: 20 %
Lymphs Abs: 0.7 10*3/uL (ref 0.7–4.0)
MCH: 32 pg (ref 26.0–34.0)
MCHC: 31 g/dL (ref 30.0–36.0)
MCV: 103 fL — ABNORMAL HIGH (ref 80.0–100.0)
Monocytes Absolute: 0.4 10*3/uL (ref 0.1–1.0)
Monocytes Relative: 11 %
Neutro Abs: 2.3 10*3/uL (ref 1.7–7.7)
Neutrophils Relative %: 67 %
Platelets: 104 10*3/uL — ABNORMAL LOW (ref 150–400)
RBC: 2.66 MIL/uL — ABNORMAL LOW (ref 3.87–5.11)
RDW: 23.2 % — ABNORMAL HIGH (ref 11.5–15.5)
WBC: 3.4 10*3/uL — ABNORMAL LOW (ref 4.0–10.5)
nRBC: 0 % (ref 0.0–0.2)

## 2019-09-21 MED ORDER — POLYETHYLENE GLYCOL 3350 17 G PO PACK: 17.0000 g | PACK | Freq: Two times a day (BID) | ORAL | 0 refills | Status: AC

## 2019-09-21 MED ORDER — BISACODYL 10 MG RE SUPP: 10.0000 mg | Freq: Every day | RECTAL | 0 refills | Status: AC | PRN

## 2019-09-21 MED ORDER — CALCITRIOL 0.5 MCG PO CAPS: 3.0000 ug | ORAL_CAPSULE | ORAL | 0 refills | Status: AC

## 2019-09-21 MED ORDER — LABETALOL HCL 200 MG PO TABS: 200.0000 mg | ORAL_TABLET | Freq: Three times a day (TID) | ORAL | 0 refills | Status: AC

## 2019-09-21 MED ORDER — HYDRALAZINE HCL 50 MG PO TABS: 50.0000 mg | ORAL_TABLET | Freq: Three times a day (TID) | ORAL | 0 refills | Status: AC

## 2019-09-21 MED ORDER — SENNOSIDES-DOCUSATE SODIUM 8.6-50 MG PO TABS: 1.0000 | ORAL_TABLET | Freq: Two times a day (BID) | ORAL | 0 refills | Status: AC

## 2019-09-21 MED ORDER — PANTOPRAZOLE SODIUM 40 MG PO TBEC: 40.0000 mg | DELAYED_RELEASE_TABLET | Freq: Every day | ORAL | 0 refills | Status: AC

## 2019-09-21 NOTE — Progress Notes (Signed)
[facility] Progress Note   Subjective:  Seen in room - for d/c today with hospice but will continue HD. Appreciate palliative care involvement - she is at peace with her plan.  Objective Vitals:   09/21/19 0500 09/21/19 0524 09/21/19 0628 09/21/19 0953  BP:  (!) 173/69 (!) 166/72 (!) 187/85  Pulse:  84 81 79  Resp:  18  16  Temp:  98.7 F (37.1 C)    TempSrc:  Oral    SpO2:  100%    Weight: 73.4 kg     Height:       Physical Exam General: Well appearing woman. Heart: RRR; 2/6 murmur Lungs: CTA anteriorly Abdomen: soft, non-tender Extremities: Trace BLE edema Dialysis Access: LUE AVG + bruit  Additional Objective Labs: Basic Metabolic Panel: Recent Labs  Lab 09/19/19 0227 09/20/19 0827 09/21/19 0809  NA 131* 129* 133*  K 3.9 4.8 4.4  CL 91* 90* 94*  CO2 26 23 26   GLUCOSE 239* 187* 215*  BUN 36* 48* 32*  CREATININE 5.00* 6.49* 4.27*  CALCIUM 8.8* 8.8* 9.4   Liver Function Tests: Recent Labs  Lab 09/16/19 0111  AST 46*  ALT 35  ALKPHOS 76  BILITOT 0.4  PROT 5.7*  ALBUMIN 2.7*   No results for input(s): LIPASE, AMYLASE in the last 168 hours. CBC: Recent Labs  Lab 09/17/19 0727 09/17/19 0727 09/18/19 0743 09/18/19 0743 09/19/19 0227 09/20/19 0827 09/21/19 0809  WBC 6.8   < > 5.5   < > 4.2 3.8* 3.4*  NEUTROABS 5.2   < > 4.1   < > 3.0 2.6 2.3  HGB 7.4*   < > 7.4*   < > 7.5* 6.9* 8.5*  HCT 22.9*   < > 23.5*   < > 23.2* 22.1* 27.4*  MCV 98.3  --  101.3*  --  101.3* 101.8* 103.0*  PLT 148*   < > 123*   < > 116* 104* 104*   < > = values in this interval not displayed.   Blood Culture    Component Value Date/Time   SDES BLOOD RIGHT UPPER ARM 02/25/2017 1927   SPECREQUEST  02/25/2017 1927    BOTTLES DRAWN AEROBIC AND ANAEROBIC Blood Culture adequate volume   CULT NO GROWTH 5 DAYS 02/25/2017 1927   REPTSTATUS 03/02/2017 FINAL 02/25/2017 1927    Cardiac Enzymes: No results for input(s): CKTOTAL, CKMB, CKMBINDEX, TROPONINI in the  last 168 hours. CBG: Recent Labs  Lab 09/19/19 2054 09/20/19 1251 09/20/19 1732 09/20/19 2115 09/21/19 0757  GLUCAP 169* 199* 227* 235* 206*   Iron Studies: No results for input(s): IRON, TIBC, TRANSFERRIN, FERRITIN in the last 72 hours. @lablastinr3 @ Studies/Results: MR BRAIN WO CONTRAST  Result Date: 09/19/2019 CLINICAL DATA:  Neuro deficit, acute, stroke suspected. Additional history provided: 54 year old female with history of ESRD on hemodialysis with recent GI bleed from angio dysplasia. EXAM: MRI HEAD WITHOUT CONTRAST TECHNIQUE: Multiplanar, multiecho pulse sequences of the brain and surrounding structures were obtained without intravenous contrast. COMPARISON:  Brain MRI 06/04/2017 FINDINGS: Brain: There is no evidence of acute infarct. No evidence of intracranial mass. No midline shift or extra-axial fluid collection. No chronic intracranial blood products. Patchy and confluent T2/FLAIR hyperintensity within the cerebral white matter and pons appears somewhat progressed from prior examination 06/04/2017. Findings are nonspecific, but consistent with chronic small vessel ischemic disease. Redemonstrated prominent perivascular space within the left lentiform nucleus. Stable cerebral volume including prominent perisylvian volume loss. Vascular: Flow voids maintained within the proximal  large arterial vessels. Skull and upper cervical spine: Redemonstrated diffuse T1 hypointense marrow signal throughout the visualized cervical spine. No focal suspicious osseous lesion. Sinuses/Orbits: Visualized orbits demonstrate no acute abnormality. No significant paranasal sinus disease or mastoid effusion at the imaged levels. IMPRESSION: No evidence of acute intracranial abnormality, including acute infarction. Chronic small vessel ischemic disease within the cerebral white matter and pons has somewhat progressed since prior MRI 06/04/2017. Stable cerebral volume loss including prominent bilateral  perisylvian volume loss. 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 Electronically Signed   By: Kellie Simmering DO   On: 09/19/2019 17:59   Medications: . [START ON 09/22/2019] ferric gluconate (FERRLECIT/NULECIT) IV     . sodium chloride   Intravenous Once  . sodium chloride   Intravenous Once  . amLODipine  10 mg Oral Daily  . calcitRIOL  3 mcg Oral Q M,W,F-HD  . Chlorhexidine Gluconate Cloth  6 each Topical Q0600  . feeding supplement (PRO-STAT SUGAR FREE 64)  30 mL Oral BID  . ferric citrate  420 mg Oral TID WC  . hydrALAZINE  10 mg Intravenous Once  . hydrALAZINE  50 mg Oral TID  . insulin aspart  0-9 Units Subcutaneous TID WC & HS  . labetalol  200 mg Oral TID  . lactulose  20 g Oral TID  . mupirocin ointment  1 application Nasal BID  . pantoprazole (PROTONIX) IV  40 mg Intravenous Q12H  . polyethylene glycol  17 g Oral BID  . senna-docusate  1 tablet Oral BID    Dialysis Orders: GO MWF 3.5h 71.5kg 2/2 bath Hep none LFA AVG  -Calcitriol 3.0 -Mircera 200 mcg q 2wks Last on 09/13/19 Venofer 50mg  qwk   Assessment/Plan: 1. GI bleeding -  recurrent issue (ABLA + anemia of ESRD): Hx angiodysplasia of colon. GI previously following - now has signed off. Hgb 4.1 on admit -> s/p 4U PRBCs this admit. Has decided not to pursue further transfusion or GI procedures -> will be discharged home with hospice (will be continuing dialysis as long as tolerated). 2. ESRD: Continue HD per MWF schedule as outpatient. 3. HTN/volume: BP remains high, UF as tolerated. 4.Secondary HPTH: Ca ok, Phos pending. Contine Auryxia binder and VDRA. 5. Nutrition: Albumin low - continue pro-stat supplements. 6. Constipation: . Senokot/Miralax ordered, s/p soap suds enema. Avoid Fleet enema.   Veneta Penton, PA-C 09/21/2019, 11:51 AM  [facility] Pager: [phone]

## 2019-09-21 NOTE — Progress Notes (Signed)
Palliative:  HPI: 54 y.o. female  with past medical history of ESRD on HD, recurrent GI bleed, CHF, HTN, diabetes, chronic pain, bipolar disorder admitted on 09/16/2019 with recurrent rectal bleeding with known angiodysplasia of colon.   I met this morning with [name] who this morning is struggling to concentrate on our conversation. She confirms her stated wishes from yesterday to return home with the assistance of hospice to help keep her comfortable to die at home and that she does NOT want to come back to the hospital. However, she is unable to confirm DNR status this morning with me and tells me that she cannot think straight as she has not slept well and needs to get home and get straight on her mental health medications and sleep so she can make more decisions. I told her that she will need to speak more with hospice at home regarding this decision. I also provided her with print out information for local body donation services with [facility] as she had requested.   All questions/concerns addressed. Emotional support provided.   Exam: Very distracted and unable to hold conversation or make decisions.   Plan:  - Home with [facility] hospice but plans to continue dialysis currently.   [place], NP Palliative Medicine Team Pager [phone] (Please see [email or web address] for schedule) Team Phone [phone]    Greater than 50%  of this time was spent counseling and coordinating care related to the above assessment and plan

## 2019-09-21 NOTE — Care Management Important Message (Signed)
Important Message  Patient Details  Name: [name] MRN: [id number] Date of Birth: Apr 22, 1966   Medicare Important Message Given:  Yes     Memory Argue 09/21/2019, 3:07 PM    UNABLE TO GIVE IM TO PATIENT BEFORE  DISCHARGE  IM MAIL TO PATIENT

## 2019-09-21 NOTE — TOC Initial Note (Addendum)
Transition of Care [facility]) - Initial/Assessment Note    Patient Details  Name: [name] MRN: [id number] Date of Birth: 1966/05/23  Transition of Care [facility]) CM/SW Contact:    Marilu Favre, RN Phone Number: 09/21/2019, 10:27 AM  Clinical Narrative:                 Confirmed face sheet information. Patient has walker and bedside commode at home.    Patient would like to continue hemodialysis however does not want to be admitted to hospital for GI bleed. Choice offered for home with hospice.   Referral for home with hospice given to [facility] with [facility]. She will review clinical and talk with patient.  Son will provide transportation home . Expected Discharge Plan: Home w Hospice Care Barriers to Discharge: No Barriers Identified   Patient Goals and CMS Choice Patient states their goals for this hospitalization and ongoing recovery are:: to go home CMS Medicare.gov Compare Post Acute Care list provided to:: Patient Choice offered to / list presented to : Patient  Expected Discharge Plan and Services Expected Discharge Plan: [place]   Discharge Planning Services: CM Consult Post Acute Care Choice: Hospice Living arrangements for the past 2 months: Apartment Expected Discharge Date: 09/21/19               DME Arranged: N/A         HH Arranged: NA HH Agency: Other - See comment Date HH Agency Contacted: 09/21/19 Time HH Agency Contacted: 31 Representative spoke with at [place]: Dorian Pod with [facility]  Prior Living Arrangements/Services Living arrangements for the past 2 months: Apartment   Patient language and need for interpreter reviewed:: Yes Do you feel safe going back to the place where you live?: Yes      Need for Family Participation in Patient Care: Yes (Comment) Care giver support system in place?: Yes (comment) Current home services: DME Criminal Activity/Legal Involvement Pertinent to Current Situation/Hospitalization: No -  Comment as needed  Activities of Daily Living Home Assistive Devices/Equipment: Cane (specify quad or straight), Walker (specify type) ADL Screening (condition at time of admission) Patient's cognitive ability adequate to safely complete daily activities?: Yes Is the patient deaf or have difficulty hearing?: No Does the patient have difficulty seeing, even when wearing glasses/contacts?: No Does the patient have difficulty concentrating, remembering, or making decisions?: No Patient able to express need for assistance with ADLs?: Yes Does the patient have difficulty dressing or bathing?: No Independently performs ADLs?: Yes (appropriate for developmental age) Does the patient have difficulty walking or climbing stairs?: Yes Weakness of Legs: Both Weakness of Arms/Hands: None  Permission Sought/Granted   Permission granted to share information with : No              Emotional Assessment Appearance:: Appears stated age Attitude/Demeanor/Rapport: Engaged Affect (typically observed): Accepting Orientation: : Oriented to Self, Oriented to Place, Oriented to  Time, Oriented to Situation Alcohol / Substance Use: Not Applicable Psych Involvement: No (comment)  Admission diagnosis:  Rectal bleeding [K62.5] Acute GI bleeding [K92.2] Patient Active Problem List   Diagnosis Date Noted  . Angiodysplasia of colon   . Acute GI bleeding 05/13/2019  . DKA (diabetic ketoacidoses) ([place]) 05/13/2019  . Symptomatic anemia 04/01/2019  . Type II diabetes mellitus with renal manifestations ([place]) 02/25/2019  . TIA (transient ischemic attack) 02/25/2019  . GERD (gastroesophageal reflux disease) 02/25/2019  . Tobacco abuse 02/25/2019  . Fluid overload 02/25/2019  . Uremia 02/25/2019  .  Melena   . Rectal bleed 12/30/2018  . Gastritis and gastroduodenitis   . Benign neoplasm of ascending colon   . Benign neoplasm of transverse colon   . Benign neoplasm of descending colon   . GI bleeding  11/17/2018  . Acute blood loss anemia   . No blood products   . GI bleed 10/05/2018  . Prolonged QT interval 10/05/2018  . Localized edema due to fluid overload 09/05/2017  . Lower GI bleed   . Evaluation by psychiatric service required 07/17/2017  . Volume overload 06/03/2017  . Right supracondylar humerus fracture, with nonunion, subsequent encounter 05/01/2017  . Malnutrition of moderate degree 02/28/2017  . Leg pain, left 02/25/2017  . Left leg pain 02/25/2017  . External hemorrhoids   . Diverticulosis of colon without hemorrhage   . Goals of care, counseling/discussion   . Palliative care encounter   . Cellulitis of left leg 01/16/2017  . Upper GI bleed 01/16/2017  . UGIB (upper gastrointestinal bleed)   . Anemia due to chronic kidney disease   . Cellulitis of left thigh 01/01/2017  . ESRD (end stage renal disease) ([place]) 01/01/2017  . Cocaine use disorder, severe, dependence ([place]) 08/29/2016  . Hepatitis C 08/29/2016  . Diabetic ulcer of calf ([place]) 08/29/2016  . Diabetic ulcer of toe ([place]) 08/29/2016  . Cerebrovascular accident (CVA) ([place]) 08/29/2016  . MDD (major depressive disorder) 08/28/2016  . Dyslipidemia associated with type 2 diabetes mellitus ([place]) 11/10/2015  . Hyperkalemia 09/17/2015  . History of vitamin D deficiency 06/20/2015  . Long term prescription opiate use 06/20/2015  . Chronic pain syndrome 06/19/2015  . Anemia of chronic kidney failure 05/01/2015  . transmetatarsal amputation of the left foot 12/13/2014  . Unilateral visual loss 11/22/2014  . 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 ([place]) 11/22/2014  . Elevated troponin 11/09/2014  . Essential hypertension 11/09/2014  . Dizziness   . Chronic combined systolic (congestive) and diastolic (congestive) heart failure ([place])   . Chest pain 08/25/2014  . Rectal bleeding 04/23/2014  . Anemia, iron deficiency 04/13/2014  . Noncompliance 02/02/2014  . Hyponatremia 05/19/2013  . Seizure disorder  ([place]) 02/09/2013   PCP:  Rocco Serene, MD Pharmacy:   [facility] [place], [place] [address] [place] [address] Phone: [phone] Fax: [phone]     Social Determinants of Health (SDOH) Interventions    Readmission Risk Interventions Readmission Risk Prevention Plan 02/26/2019 01/21/2019 11/17/2018  Transportation Screening Complete Complete -  Medication Review Press photographer) Complete Complete -  PCP or Specialist appointment within 3-5 days of discharge Complete Complete -  [place] or Home Care Consult Complete Complete -  SW Recovery Care/Counseling Consult Complete - Complete  Palliative Care Screening Not Applicable Not Applicable -  [place] Not Applicable Not Applicable -  Some recent data might be hidden

## 2019-09-21 NOTE — Discharge Summary (Signed)
Physician Discharge Summary  [name] XLK:[id number] DOB: Dec 13, 1965 DOA: 09/16/2019  PCP: Rocco Serene, MD  Admit date: 09/16/2019 Discharge date: 09/21/2019  Admitted From: Home Disposition: Home  Recommendations for Outpatient Follow-up:  1. Follow up with PCP in 1 week with repeat CBC/BMP 2. Outpatient follow-up with GI 3. Follow-up with dialysis as an outpatient as scheduled 4. Outpatient follow-up with palliative care/hospice 5. Follow up in ED if symptoms worsen or new appear   Home Health: No Equipment/Devices: None  Discharge Condition: Guarded to poor CODE STATUS: Full Diet recommendation: Heart healthy/carb modified/renal hemodialysis diet  Brief/Interim Summary: 54 year old female with history of ESRD on hemodialysis, recurrent GI bleed with multiple recurrent admissions with last admission in January 2021 when she underwent EGD and colonoscopy and had ablation of the angiodysplasia in the colon presented with rectal bleeding.  Hemoglobin was 4.1 admission.  GI was consulted.  Nephrology was also consulted.  She was transfused  packed red cells.  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  She required a total of 4 units packed red cells transfusion during this hospitalization.  No subsequent rectal bleeding since admission.  GI evaluated the patient, recommended conservative management and signed off.  Hemoglobin 8.5 this morning.  She requested palliative care evaluation.  Palliative care has evaluated the patient and is arranging for home hospice but patient wants to continue dialysis at this time and wants to talk to hospice at home before making any changes in her status.  She will be discharged home today.  Outpatient follow-up with GI and hemodialysis unit as scheduled.  Discharge Diagnoses:   Acute GI bleeding, most likely lower GI bleeding in a patient with history of recurrent GI bleeding and  angiodysplasia of colon Acute on chronic blood loss anemia -Patient has had recurrent hospitalization for the same.  Patient has a history of being Jehovah's Witness but has agreed to blood transfusion during recent admissions.  Status post 3 units packed red cells transfusion during this hospitalization.  Hemoglobin 6.9  on 09/20/2019.  Status post 1 more unit of transfusion on 09/20/2019.  Hemoglobin 8.5 this morning.  -GI evaluated the patient, recommended conservative management and signed off.  If she were to bleed again, GI had recommended tagged red cell scan.  Outpatient follow-up with GI -No rectal bleeding since admission.  Constipation -Patient had received enema during the hospitalization.  She finally had a bowel movement on 09/20/2019.  Patient will need to continue Senokot and MiraLAX along with as needed Dulcolax suppository.  Outpatient follow-up  End-stage renal disease on hemodialysis -Nephrology following.    Patient underwent dialysis during the hospitalization as per nephrology.  She will need to be compliant with outpatient hemodialysis.  Diabetes mellitus type 2 with hyperglycemia -Continue home regimen.  Carb modified diet.  Hypertension -Blood pressure still elevated.  Continue amlodipine and  increased dose of hydralazine and labetalol.  Outpatient follow-up  Generalized conditioning -She requested palliative care evaluation.  Palliative care has evaluated the patient and is arranging for home hospice but patient wants to continue dialysis at this time and wants to talk to hospice at home before making any changes in her status.  She will currently remain full code for now.   Discharge Instructions  Discharge Instructions    Ambulatory referral to Gastroenterology   Complete by: As directed    Follow up for GI bleed   Diet - low sodium heart  healthy   Complete by: As directed    Diet Carb Modified   Complete by: As directed    Increase activity slowly    Complete by: As directed      Allergies as of 09/21/2019      Reactions   Penicillins Hives, Other (See Comments)   Has patient had a PCN reaction causing immediate rash, facial/tongue/throat swelling, SOB or lightheadedness with hypotension: Yes Has patient had a PCN reaction causing severe rash involving mucus membranes or skin necrosis: No Has patient had a PCN reaction that required hospitalization No Has patient had a PCN reaction occurring within the last 10 years: No If all of the above answers are "NO", then may proceed with Cephalosporin use. Pt tolerates cefepime and ceftriaxone   Gabapentin Itching, Swelling   Lyrica [pregabalin] Swelling, Other (See Comments)   Facial and leg swelling   Saphris [asenapine] Swelling, Other (See Comments)   Facial swelling   Tramadol Swelling, Other (See Comments)   Leg swelling   Adhesive [tape] Itching   SEVERE ITCHING   Latex Itching   SEVERE ITCHING   Other Other (See Comments)   NO BLOOD PRODUCTS; PATIENT IS A JEHOVAH'S WITNESS      Medication List    STOP taking these medications   citalopram 40 MG tablet Commonly known as: CELEXA   diphenhydrAMINE 25 mg capsule Commonly known as: BENADRYL   furosemide 80 MG tablet Commonly known as: LASIX   oxymetazoline 0.05 % nasal spray Commonly known as: AFRIN     TAKE these medications   acetaminophen 325 MG tablet Commonly known as: TYLENOL Take 2 tablets (650 mg total) by mouth every 6 (six) hours as needed for fever, headache or moderate pain.   albuterol 108 (90 Base) MCG/ACT inhaler Commonly known as: VENTOLIN HFA Inhale 2 puffs into the lungs every 4 (four) hours as needed for wheezing or shortness of breath.   albuterol (2.5 MG/3ML) 0.083% nebulizer solution Commonly known as: PROVENTIL Take 6 mLs (5 mg total) by nebulization daily as needed for wheezing or shortness of breath.   allopurinol 100 MG tablet Commonly known as: ZYLOPRIM Take 2 tablets (200 mg total)  by mouth daily.   ALPRAZolam 1 MG tablet Commonly known as: XANAX Take 1 mg by mouth 3 (three) times daily as needed for anxiety.   amLODipine 10 MG tablet Commonly known as: NORVASC Take 1 tablet (10 mg total) by mouth daily.   amphetamine-dextroamphetamine 10 MG 24 hr capsule Commonly known as: ADDERALL XR Take 10 mg by mouth every morning.   atorvastatin 40 MG tablet Commonly known as: LIPITOR Take 1 tablet (40 mg total) by mouth at bedtime.   bisacodyl 10 MG suppository Commonly known as: DULCOLAX Place 1 suppository (10 mg total) rectally daily as needed for severe constipation.   busPIRone 5 MG tablet Commonly known as: BUSPAR Take 5 mg by mouth 3 (three) times daily.   calcitRIOL 0.5 MCG capsule Commonly known as: ROCALTROL Take 6 capsules (3 mcg total) by mouth every Monday, Wednesday, and Friday with hemodialysis. Start taking on: September 22, 2019   Darbepoetin Alfa 200 MCG/0.4ML Sosy injection Commonly known as: ARANESP Inject 0.4 mLs (200 mcg total) into the vein every Wednesday with hemodialysis.   DULoxetine 30 MG capsule Commonly known as: CYMBALTA Take 30 mg by mouth at bedtime.   hydrALAZINE 50 MG tablet Commonly known as: APRESOLINE Take 1 tablet (50 mg total) by mouth 3 (three) times daily. What changed:  medication strength  how much to take   hydrocortisone 2.5 % rectal cream Commonly known as: ANUSOL-HC Place 1 application rectally 2 (two) times daily.   hydrOXYzine 25 MG tablet Commonly known as: ATARAX/VISTARIL Take 1 tablet (25 mg total) by mouth 3 (three) times daily as needed for itching or anxiety.   insulin aspart 100 UNIT/ML injection Commonly known as: novoLOG 0-9 Units, Subcutaneous, 3 times daily with meals CBG < 70: implement hypoglycemia protocol CBG 70 - 120: 0 units CBG 121 - 150: 1 unit CBG 151 - 200: 2 units CBG 201 - 250: 3 units CBG 251 - 300: 5 units CBG 301 - 350: 7 units CBG 351 - 400: 9 units CBG > 400: call  MD What changed:   how much to take  how to take this  when to take this   insulin glargine 100 UNIT/ML injection Commonly known as: LANTUS Inject 24 Units into the skin at bedtime.   labetalol 200 MG tablet Commonly known as: NORMODYNE Take 1 tablet (200 mg total) by mouth 3 (three) times daily. What changed: when to take this   lidocaine-prilocaine cream Commonly known as: EMLA Apply 1 application topically See admin instructions. On dialysis days   methocarbamol 500 MG tablet Commonly known as: ROBAXIN Take 500 mg by mouth daily as needed for muscle spasms.   mupirocin ointment 2 % Commonly known as: BACTROBAN Apply 1 application topically as needed (on arm).   oxyCODONE 15 MG immediate release tablet Commonly known as: ROXICODONE Take 15 mg by mouth every 4 (four) hours as needed for pain.   pantoprazole 40 MG tablet Commonly known as: PROTONIX Take 1 tablet (40 mg total) by mouth daily at 6 (six) AM.   polyethylene glycol 17 g packet Commonly known as: MIRALAX / GLYCOLAX Take 17 g by mouth 2 (two) times daily.   SARNA ULTRA EX Apply 1 application topically daily as needed (itch).   senna-docusate 8.6-50 MG tablet Commonly known as: Senokot-S Take 1 tablet by mouth 2 (two) times daily. What changed:   how much to take  when to take this      Follow-up Information    Rocco Serene, MD. Schedule an appointment as soon as possible for a visit in 1 week(s).   Specialty: Internal Medicine Contact information: [place] [address] [phone]        Palliative care. Schedule an appointment as soon as possible for a visit.   Why: At earliest convenience       Danis, Estill Cotta III, MD. Schedule an appointment as soon as possible for a visit in 1 week(s).   Specialty: Gastroenterology Contact information: [address] [place] [address] [phone]          Allergies  Allergen Reactions  . Penicillins Hives  and Other (See Comments)    Has patient had a PCN reaction causing immediate rash, facial/tongue/throat swelling, SOB or lightheadedness with hypotension: Yes Has patient had a PCN reaction causing severe rash involving mucus membranes or skin necrosis: No Has patient had a PCN reaction that required hospitalization No Has patient had a PCN reaction occurring within the last 10 years: No If all of the above answers are "NO", then may proceed with Cephalosporin use. Pt tolerates cefepime and ceftriaxone  . Gabapentin Itching and Swelling  . Lyrica [Pregabalin] Swelling and Other (See Comments)    Facial and leg swelling  . Saphris [Asenapine] Swelling and Other (See  Comments)    Facial swelling  . Tramadol Swelling and Other (See Comments)    Leg swelling  . Adhesive [Tape] Itching    SEVERE ITCHING  . Latex Itching    SEVERE ITCHING  . Other Other (See Comments)    NO BLOOD PRODUCTS; PATIENT IS A JEHOVAH'S WITNESS    Consultations:  GI/nephrology/palliative care   Procedures/Studies: DG Abd 1 View  Result Date: 09/19/2019 CLINICAL DATA:  Constipation, nausea and vomiting for 4 days. EXAM: ABDOMEN - 1 VIEW COMPARISON:  May 13, 2019 FINDINGS: There is no bowel obstruction. Relative paucity of bowel gas is noted. Moderate bowel content is identified in the visualized colon. IMPRESSION: No bowel obstruction. Moderate bowel content identified in the visualized colon. Electronically Signed   By: Abelardo Diesel M.D.   On: 09/19/2019 09:02   MR BRAIN WO CONTRAST  Result Date: 09/19/2019 CLINICAL DATA:  Neuro deficit, acute, stroke suspected. Additional history provided: 54 year old female with history of ESRD on hemodialysis with recent GI bleed from angio dysplasia. EXAM: MRI HEAD WITHOUT CONTRAST TECHNIQUE: Multiplanar, multiecho pulse sequences of the brain and surrounding structures were obtained without intravenous contrast. COMPARISON:  Brain MRI 06/04/2017 FINDINGS: Brain: There is  no evidence of acute infarct. No evidence of intracranial mass. No midline shift or extra-axial fluid collection. No chronic intracranial blood products. Patchy and confluent T2/FLAIR hyperintensity within the cerebral white matter and pons appears somewhat progressed from prior examination 06/04/2017. Findings are nonspecific, but consistent with chronic small vessel ischemic disease. Redemonstrated prominent perivascular space within the left lentiform nucleus. Stable cerebral volume including prominent perisylvian volume loss. Vascular: Flow voids maintained within the proximal large arterial vessels. Skull and upper cervical spine: Redemonstrated diffuse T1 hypointense marrow signal throughout the visualized cervical spine. No focal suspicious osseous lesion. Sinuses/Orbits: Visualized orbits demonstrate no acute abnormality. No significant paranasal sinus disease or mastoid effusion at the imaged levels. IMPRESSION: No evidence of acute intracranial abnormality, including acute infarction. Chronic small vessel ischemic disease within the cerebral white matter and pons has somewhat progressed since prior MRI 06/04/2017. Stable cerebral volume loss including prominent bilateral perisylvian volume loss. 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 Electronically Signed   By: Kellie Simmering DO   On: 09/19/2019 17:59       Subjective: Patient seen and examined at bedside.  She feels better and wants to go home today.  Had bowel movement yesterday and denies any fresh bleeding.  No overnight fever or vomiting reported.  No worsening abdominal pain.  Discharge Exam: Vitals:   09/21/19 0628 09/21/19 0953  BP: (!) 166/72 (!) 187/85  Pulse: 81 79  Resp:    Temp:    SpO2:      General: Pt is alert, awake, not in acute distress.  Looks chronically ill.  Poor historian. Cardiovascular: rate controlled, S1/S2  + Respiratory: bilateral decreased breath sounds at bases with some scattered crackles Abdominal: Soft, NT, ND, bowel sounds + Extremities: Trace lower extremity edema;  no cyanosis    The results of significant diagnostics from this hospitalization (including imaging, microbiology, ancillary and laboratory) are listed below for reference.     Microbiology: Recent Results (from the past 240 hour(s))  Respiratory Panel by RT PCR (Flu A&B, Covid) - Nasopharyngeal Swab     Status: None   Collection Time: 09/16/19  1:25 AM   Specimen: Nasopharyngeal Swab  Result Value Ref Range Status   SARS Coronavirus  2 by RT PCR NEGATIVE NEGATIVE Final    Comment: (NOTE) SARS-CoV-2 target nucleic acids are NOT DETECTED. The SARS-CoV-2 RNA is generally detectable in upper respiratoy specimens during the acute phase of infection. The lowest concentration of SARS-CoV-2 viral copies this assay can detect is 131 copies/mL. A negative result does not preclude SARS-Cov-2 infection and should not be used as the sole basis for treatment or other patient management decisions. A negative result may occur with  improper specimen collection/handling, submission of specimen other than nasopharyngeal swab, presence of viral mutation(s) within the areas targeted by this assay, and inadequate number of viral copies (<131 copies/mL). A negative result must be combined with clinical observations, patient history, and epidemiological information. The expected result is Negative. Fact Sheet for Patients:  PinkCheek.be Fact Sheet for Healthcare Providers:  GravelBags.it This test is not yet ap proved or cleared by the [place] FDA and  has been authorized for detection and/or diagnosis of SARS-CoV-2 by FDA under an Emergency Use Authorization (EUA). This EUA will remain  in effect (meaning this test can be used) for the duration of the COVID-19 declaration  under Section 564(b)(1) of the Act, 21 U.S.C. section 360bbb-3(b)(1), unless the authorization is terminated or revoked sooner.    Influenza A by PCR NEGATIVE NEGATIVE Final   Influenza B by PCR NEGATIVE NEGATIVE Final    Comment: (NOTE) The Xpert Xpress SARS-CoV-2/FLU/RSV assay is intended as an aid in  the diagnosis of influenza from Nasopharyngeal swab specimens and  should not be used as a sole basis for treatment. Nasal washings and  aspirates are unacceptable for Xpert Xpress SARS-CoV-2/FLU/RSV  testing. Fact Sheet for Patients: PinkCheek.be Fact Sheet for Healthcare Providers: GravelBags.it This test is not yet approved or cleared by the [place] FDA and  has been authorized for detection and/or diagnosis of SARS-CoV-2 by  FDA under an Emergency Use Authorization (EUA). This EUA will remain  in effect (meaning this test can be used) for the duration of the  Covid-19 declaration under Section 564(b)(1) of the Act, 21  U.S.C. section 360bbb-3(b)(1), unless the authorization is  terminated or revoked. Performed at [facility], [place] [address] [place], [place] [address]   MRSA PCR Screening     Status: Abnormal   Collection Time: 09/19/19 10:25 AM   Specimen: Nasopharyngeal  Result Value Ref Range Status   MRSA by PCR POSITIVE (A) NEGATIVE Final    Comment:        The GeneXpert MRSA Assay (FDA approved for NASAL specimens only), is one component of a comprehensive MRSA colonization surveillance program. It is not intended to diagnose MRSA infection nor to guide or monitor treatment for MRSA infections. RESULT CALLED TO, READ BACK BY AND VERIFIED WITH: BECKNER ON [id number] AT 1440 BY NFIELDS Performed at [facility], [place] [address] [place], [place] [address]      Labs: BNP (last 3 results) No results for input(s): BNP in the last 8760 hours. Basic Metabolic Panel: Recent Labs  Lab  09/17/19 0727 09/18/19 0743 09/19/19 0227 09/20/19 0827 09/21/19 0809  NA 136 134* 131* 129* 133*  K 4.2 3.7 3.9 4.8 4.4  CL 93* 94* 91* 90* 94*  CO2 30 29 26 23 26   GLUCOSE 111* 218* 239* 187* 215*  BUN 37* 24* 36* 48* 32*  CREATININE 5.11* 3.81* 5.00* 6.49* 4.27*  CALCIUM 9.1 9.2 8.8* 8.8* 9.4   Liver Function Tests: Recent Labs  Lab 09/16/19 0111  AST  46*  ALT 35  ALKPHOS 76  BILITOT 0.4  PROT 5.7*  ALBUMIN 2.7*   No results for input(s): LIPASE, AMYLASE in the last 168 hours. No results for input(s): AMMONIA in the last 168 hours. CBC: Recent Labs  Lab 09/17/19 0727 09/18/19 0743 09/19/19 0227 09/20/19 0827 09/21/19 0809  WBC 6.8 5.5 4.2 3.8* 3.4*  NEUTROABS 5.2 4.1 3.0 2.6 2.3  HGB 7.4* 7.4* 7.5* 6.9* 8.5*  HCT 22.9* 23.5* 23.2* 22.1* 27.4*  MCV 98.3 101.3* 101.3* 101.8* 103.0*  PLT 148* 123* 116* 104* 104*   Cardiac Enzymes: No results for input(s): CKTOTAL, CKMB, CKMBINDEX, TROPONINI in the last 168 hours. BNP: Invalid input(s): POCBNP CBG: Recent Labs  Lab 09/19/19 2054 09/20/19 1251 09/20/19 1732 09/20/19 2115 09/21/19 0757  GLUCAP 169* 199* 227* 235* 206*   D-Dimer No results for input(s): DDIMER in the last 72 hours. Hgb A1c No results for input(s): HGBA1C in the last 72 hours. Lipid Profile No results for input(s): CHOL, HDL, LDLCALC, TRIG, CHOLHDL, LDLDIRECT in the last 72 hours. Thyroid function studies No results for input(s): TSH, T4TOTAL, T3FREE, THYROIDAB in the last 72 hours.  Invalid input(s): FREET3 Anemia work up No results for input(s): VITAMINB12, FOLATE, FERRITIN, TIBC, IRON, RETICCTPCT in the last 72 hours. Urinalysis    Component Value Date/Time   COLORURINE YELLOW 02/25/2017 1224   APPEARANCEUR HAZY (A) 02/25/2017 1224   LABSPEC 1.013 02/25/2017 1224   PHURINE 8.0 02/25/2017 1224   GLUCOSEU >=500 (A) 02/25/2017 1224   HGBUR SMALL (A) 02/25/2017 1224   BILIRUBINUR NEGATIVE 02/25/2017 1224   BILIRUBINUR neg  12/13/2014 1127   KETONESUR NEGATIVE 02/25/2017 1224   PROTEINUR >=300 (A) 02/25/2017 1224   UROBILINOGEN 0.2 05/01/2015 1252   NITRITE NEGATIVE 02/25/2017 1224   LEUKOCYTESUR NEGATIVE 02/25/2017 1224   Sepsis Labs Invalid input(s): PROCALCITONIN,  WBC,  LACTICIDVEN Microbiology Recent Results (from the past 240 hour(s))  Respiratory Panel by RT PCR (Flu A&B, Covid) - Nasopharyngeal Swab     Status: None   Collection Time: 09/16/19  1:25 AM   Specimen: Nasopharyngeal Swab  Result Value Ref Range Status   SARS Coronavirus 2 by RT PCR NEGATIVE NEGATIVE Final    Comment: (NOTE) SARS-CoV-2 target nucleic acids are NOT DETECTED. The SARS-CoV-2 RNA is generally detectable in upper respiratoy specimens during the acute phase of infection. The lowest concentration of SARS-CoV-2 viral copies this assay can detect is 131 copies/mL. A negative result does not preclude SARS-Cov-2 infection and should not be used as the sole basis for treatment or other patient management decisions. A negative result may occur with  improper specimen collection/handling, submission of specimen other than nasopharyngeal swab, presence of viral mutation(s) within the areas targeted by this assay, and inadequate number of viral copies (<131 copies/mL). A negative result must be combined with clinical observations, patient history, and epidemiological information. The expected result is Negative. Fact Sheet for Patients:  PinkCheek.be Fact Sheet for Healthcare Providers:  GravelBags.it This test is not yet ap proved or cleared by the [place] FDA and  has been authorized for detection and/or diagnosis of SARS-CoV-2 by FDA under an Emergency Use Authorization (EUA). This EUA will remain  in effect (meaning this test can be used) for the duration of the COVID-19 declaration under Section 564(b)(1) of the Act, 21 U.S.C. section 360bbb-3(b)(1), unless  the authorization is terminated or revoked sooner.    Influenza A by PCR NEGATIVE NEGATIVE Final   Influenza B by PCR NEGATIVE NEGATIVE  Final    Comment: (NOTE) The Xpert Xpress SARS-CoV-2/FLU/RSV assay is intended as an aid in  the diagnosis of influenza from Nasopharyngeal swab specimens and  should not be used as a sole basis for treatment. Nasal washings and  aspirates are unacceptable for Xpert Xpress SARS-CoV-2/FLU/RSV  testing. Fact Sheet for Patients: PinkCheek.be Fact Sheet for Healthcare Providers: GravelBags.it This test is not yet approved or cleared by the [place] FDA and  has been authorized for detection and/or diagnosis of SARS-CoV-2 by  FDA under an Emergency Use Authorization (EUA). This EUA will remain  in effect (meaning this test can be used) for the duration of the  Covid-19 declaration under Section 564(b)(1) of the Act, 21  U.S.C. section 360bbb-3(b)(1), unless the authorization is  terminated or revoked. Performed at [facility], [place] [address], [place], [place] [address]   MRSA PCR Screening     Status: Abnormal   Collection Time: 09/19/19 10:25 AM   Specimen: Nasopharyngeal  Result Value Ref Range Status   MRSA by PCR POSITIVE (A) NEGATIVE Final    Comment:        The GeneXpert MRSA Assay (FDA approved for NASAL specimens only), is one component of a comprehensive MRSA colonization surveillance program. It is not intended to diagnose MRSA infection nor to guide or monitor treatment for MRSA infections. RESULT CALLED TO, READ BACK BY AND VERIFIED WITH: BECKNER ON [id number] AT 1440 BY NFIELDS Performed at [facility], [place] [address] [place], [place] [address]      Time coordinating discharge: 35 minutes  SIGNED:   Aline August, MD  [facility] 09/21/2019, 10:11 AM

## 2019-09-21 NOTE — Progress Notes (Signed)
PT Cancellation Note  Patient Details Name: [name] MRN: [id number] DOB: 1965/08/09   Cancelled Treatment:    Reason Eval/Treat Not Completed: Patient declined, no reason specified. Upon arrival pt dressed in personal clothing and resting in bed. Pt politely declining therapy stating she cannot do therapy right now and was not able to get any sleep last night. Pt stating maybe later. PT will attempt treatment session at later date/time as schedule allows and pt appropriate.   Zachary George PT, DPT 10:24 AM,09/21/19    Emslee Lopezmartinez Drucilla Chalet 09/21/2019, 10:23 AM

## 2019-09-21 NOTE — Plan of Care (Signed)
Pt for discharge today going home, alert and oriented, ambulatory, tolerates her meal, given pain med for headache and relieved, given health teachings, next appointment, due med explained and understood, discontinued peripheral IV line, given all her personal belongings, waiting for her family to pick her up.

## 2019-09-21 NOTE — Progress Notes (Signed)
Palliative Medicine RN Note: Rec'd call from [name]. They will reach out to [name] in preparation for admission to hospice when she discharges; they reviewed the chart and tentatively plan to admit under CHF so that she may continue her HD appointments.  Marjie Skiff Melynda Krzywicki, RN, BSN, [facility] Palliative Medicine Team 09/21/2019 9:58 AM Office [phone]

## 2019-10-21 ENCOUNTER — Other Ambulatory Visit: Payer: Self-pay | Admitting: Nephrology

## 2019-10-21 DIAGNOSIS — I825Y2 Chronic embolism and thrombosis of unspecified deep veins of left proximal lower extremity: Secondary | ICD-10-CM

## 2019-10-26 ENCOUNTER — Other Ambulatory Visit: Payer: Medicare Other

## 2019-10-27 ENCOUNTER — Other Ambulatory Visit: Payer: Medicare Other

## 2019-10-28 ENCOUNTER — Other Ambulatory Visit: Payer: Medicare Other

## 2020-01-14 IMAGING — DX DG ABDOMEN ACUTE W/ 1V CHEST
3 series · 3 of 3 positions shown · non-contrast
Comparison: Chest radiographs 03/16/2018

CLINICAL DATA: Missed dialysis.  GI bleed.

EXAM:
DG ABDOMEN ACUTE W/ 1V CHEST

[chest pa]
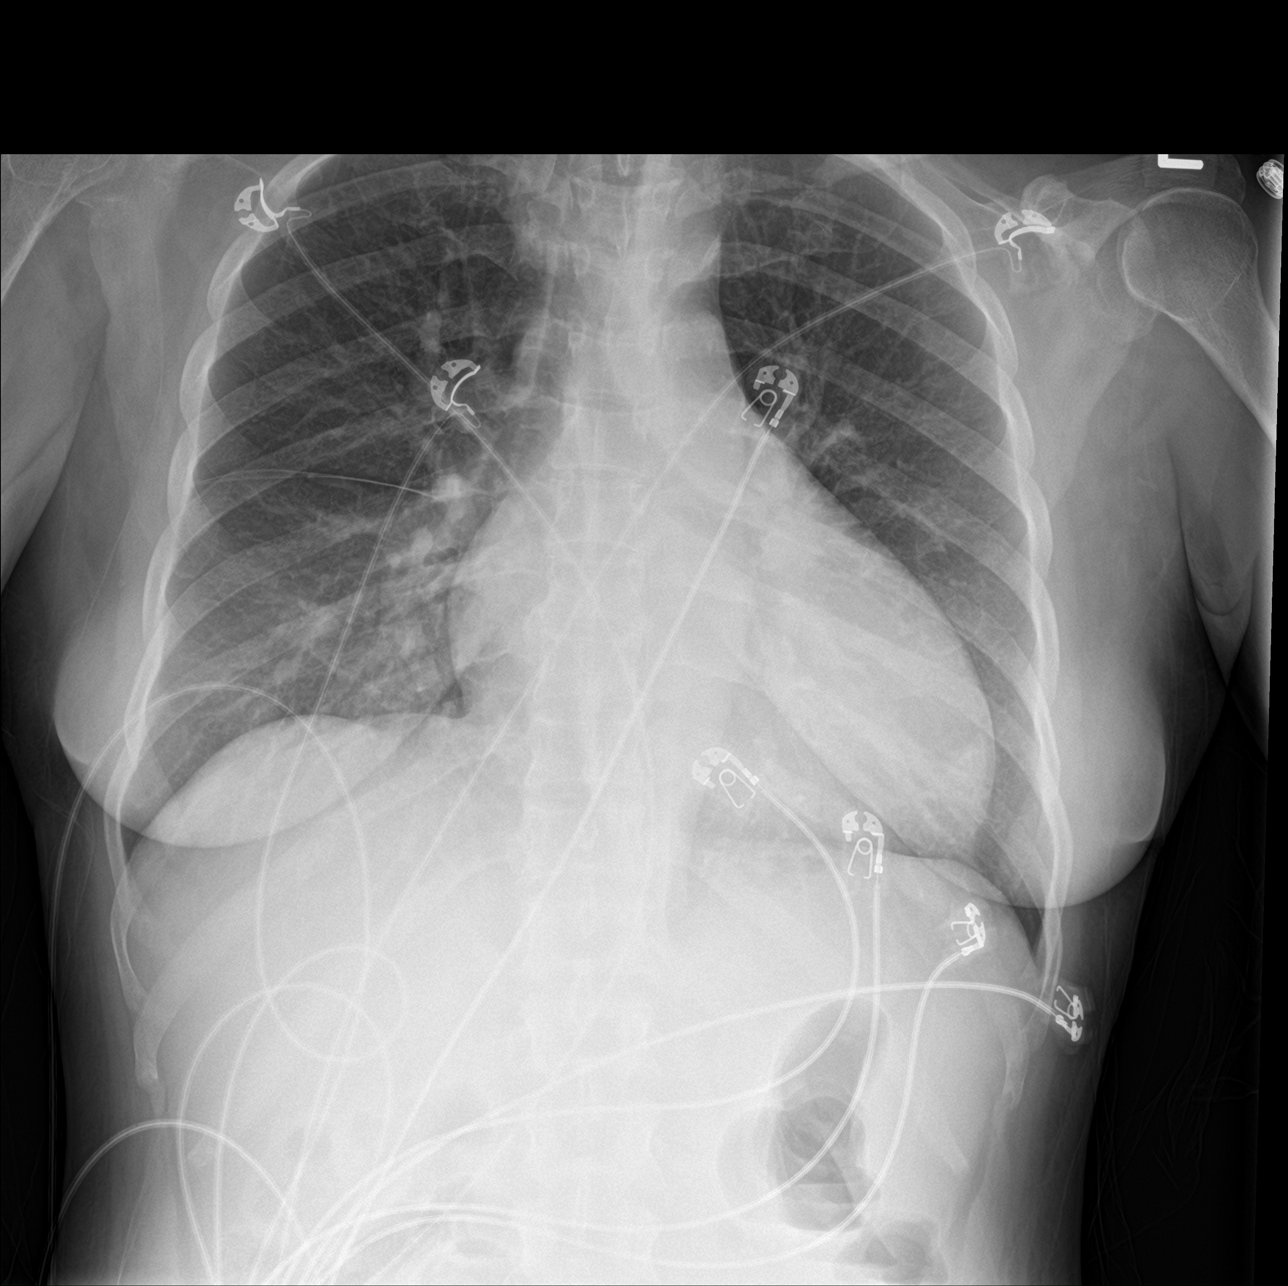

[abdomen erect]
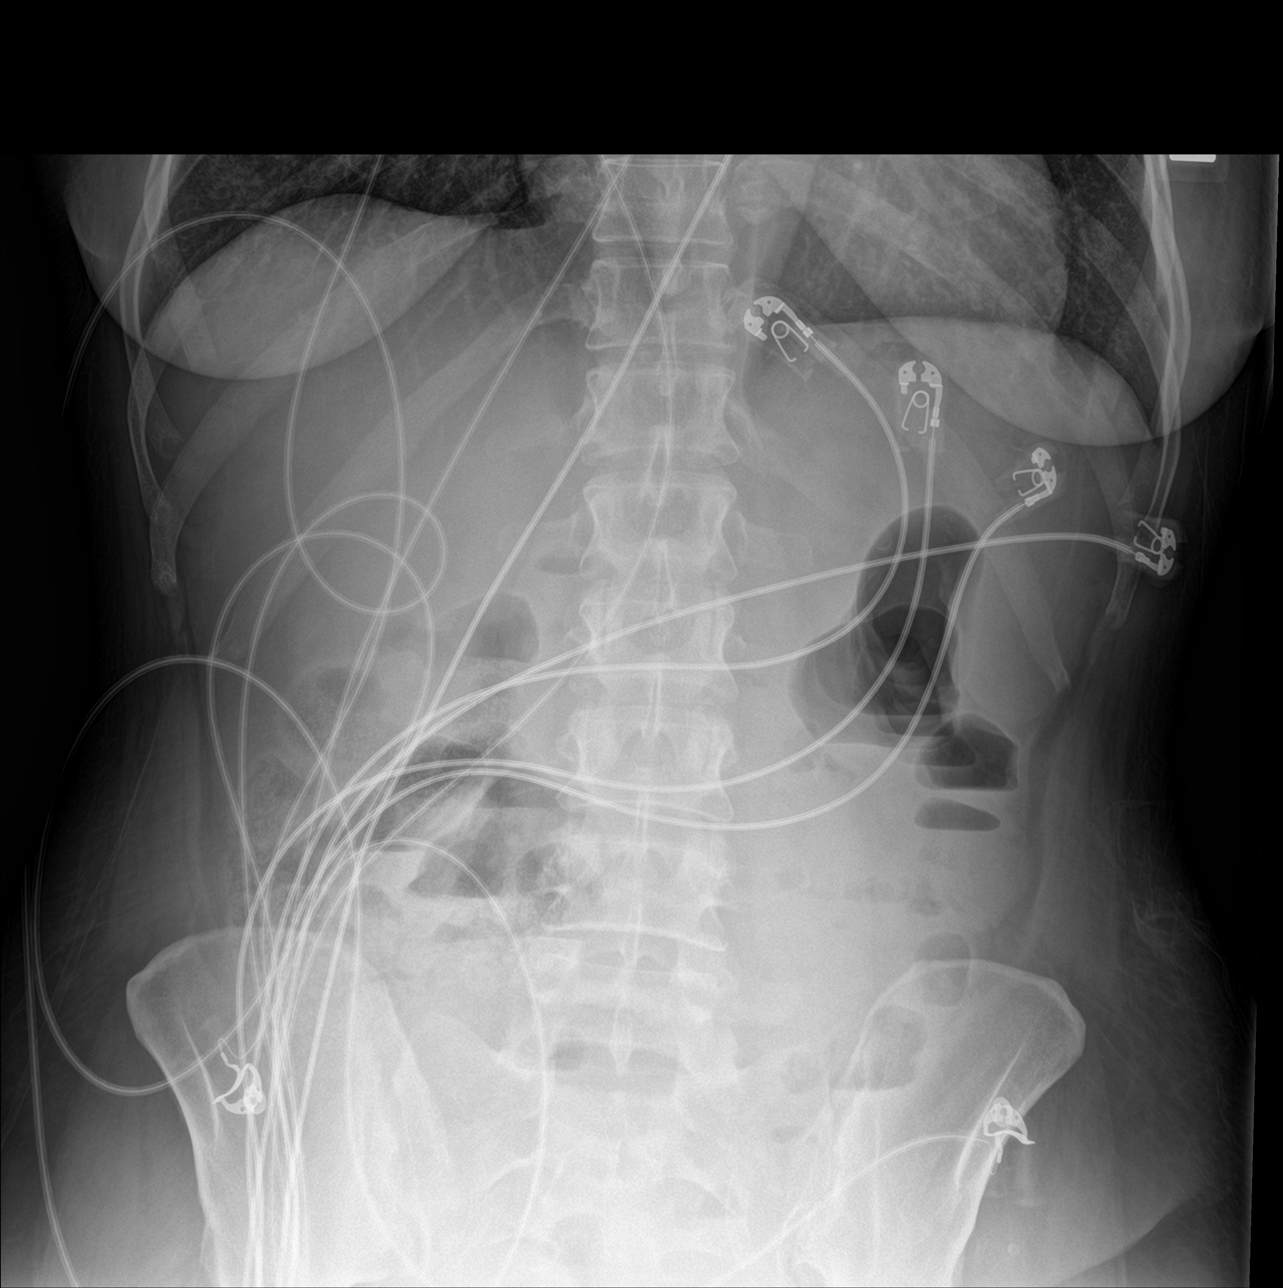

[abdomen supine]
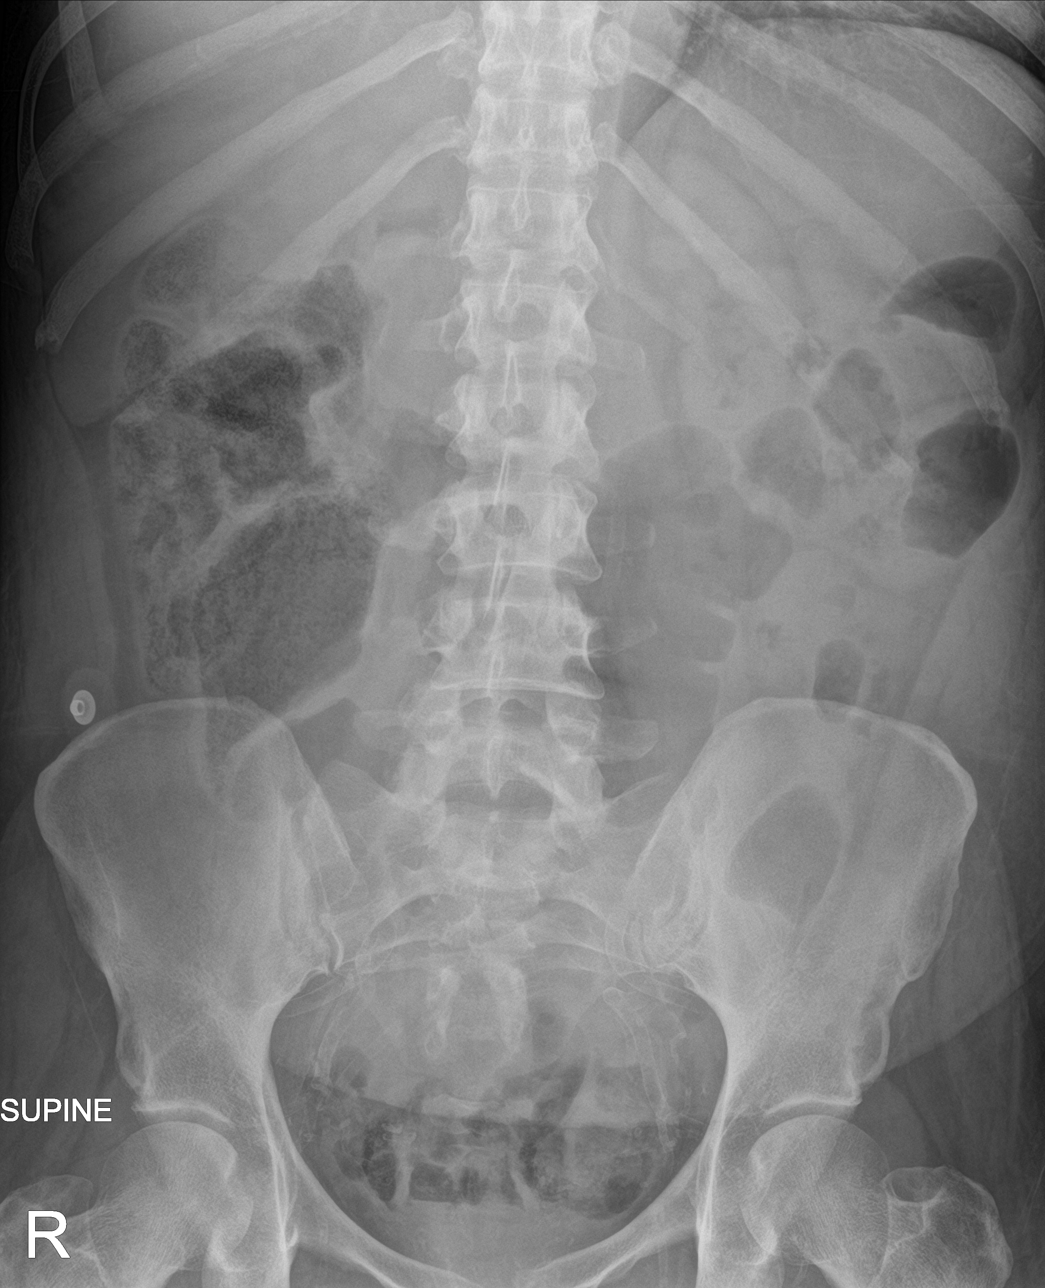

[3 of 3 positions shown; findings below may reference images not displayed]

FINDINGS: Chronic cardiomegaly with slight improvement from prior. Chronic
vascular congestion. Improved pulmonary edema. No significant
pleural fluid.

No bowel dilatation to suggest obstruction. Air-filled transverse
colon with air-fluid levels, formed stool in the more proximal and
distal colon. No evidence of free air. Advanced vascular
calcifications, no radiopaque calculi. No evidence of acute osseous
abnormality.
IMPRESSION: 1. No free air or bowel obstruction. Mild gaseous distension of
transverse colon with air-fluid levels, formed stool proximal and
distally.
2. Chronic cardiomegaly and vascular congestion.

## 2020-01-15 IMAGING — NM NM GI BLOOD LOSS
2 series · 12 of 12 positions shown · non-contrast
Comparison: None.

CLINICAL DATA: GI bleed

EXAM:
NUCLEAR MEDICINE GASTROINTESTINAL BLEEDING SCAN
TECHNIQUE: Sequential abdominal images were obtained following intravenous
administration of Rc-PPm labeled red blood cells.
RADIOPHARMACEUTICALS:  24.1 mCi Rc-PPm pertechnetate in-vitro
labeled red cells.

[gi gi bleed · 4.52mm/px · 6 of 60 frames shown (1 of 2)]
[frame 6/60]
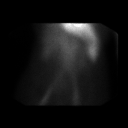
[frame 16/60]
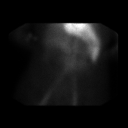
[frame 26/60]
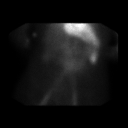
[frame 36/60]
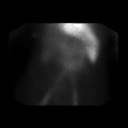
[frame 46/60]
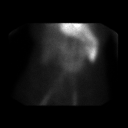
[frame 56/60]
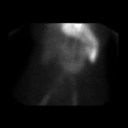

[gi gi bleed · 4.52mm/px · 6 of 39 frames shown (2 of 2)]
[frame 4/39]
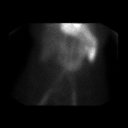
[frame 10/39]
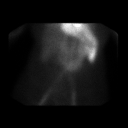
[frame 17/39]
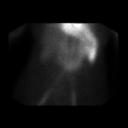
[frame 23/39]
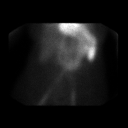
[frame 30/39]
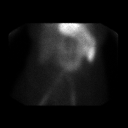
[frame 36/39]
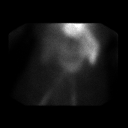

[12 of 12 positions shown; findings below may reference images not displayed]

FINDINGS: Significant activity in the stomach due to free technetium secretion
by the stomach. This is stable over the 1 hour time frame of the
study. The patient refused further imaging after 1 hour.

No evidence of GI bleeding.  No activity in the colon.
IMPRESSION: Negative for active GI bleeding at this time.

## 2020-05-02 IMAGING — CT CT ABDOMEN AND PELVIS WITHOUT CONTRAST
2 series · 15 of 42 positions shown, 18 images · non-contrast
Comparison: Contrast enhanced CT 05/12/2015

CLINICAL DATA: Recurrent anemia with GI blood loss of unknown
source.

EXAM:
CT ABDOMEN AND PELVIS WITHOUT CONTRAST
TECHNIQUE: Multidetector CT imaging of the abdomen and pelvis was performed
following the standard protocol without IV contrast.

[Series 3: ap without · axial · non-contrast · 0.82mm/px · z∈[+911,+1346]mm · 12 of 98 slices shown, 15 images]
[im 7/98  soft-tissue]
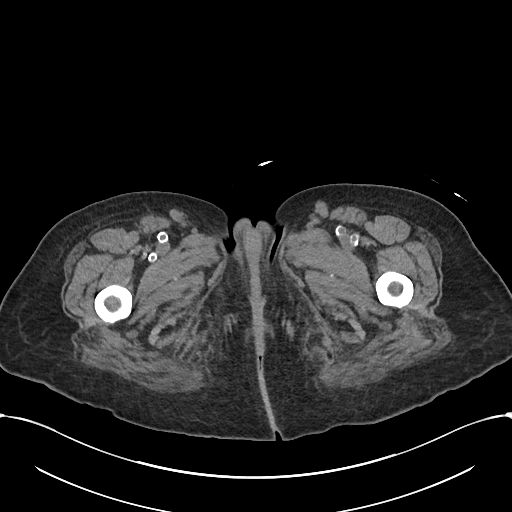
[im 7/98  bone]
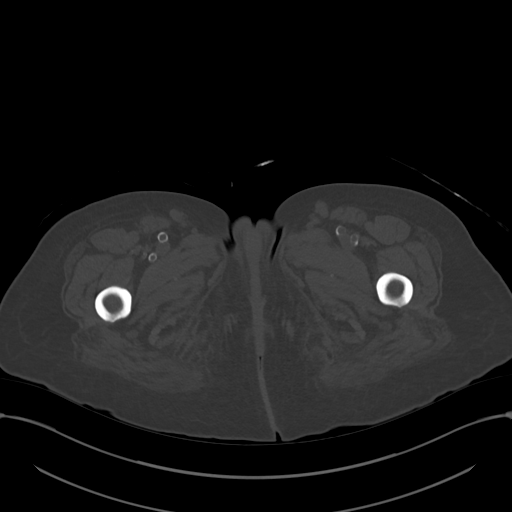
[im 19/98  soft-tissue]
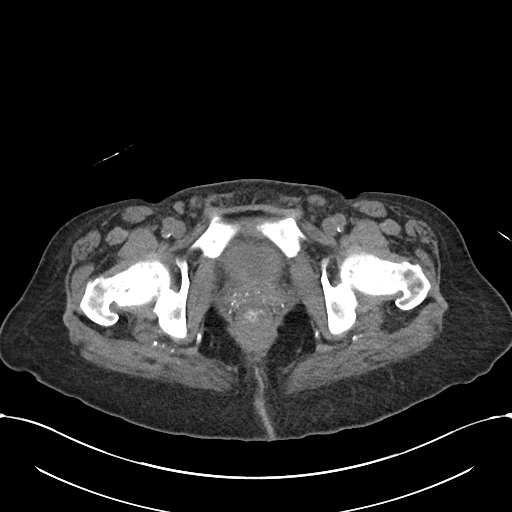
[im 29/98  soft-tissue]
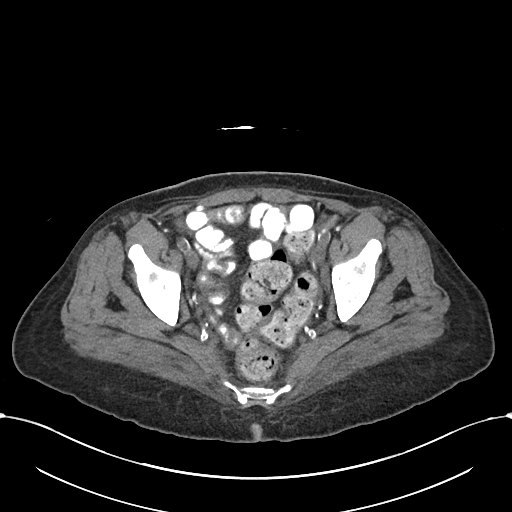
[im 38/98  soft-tissue]
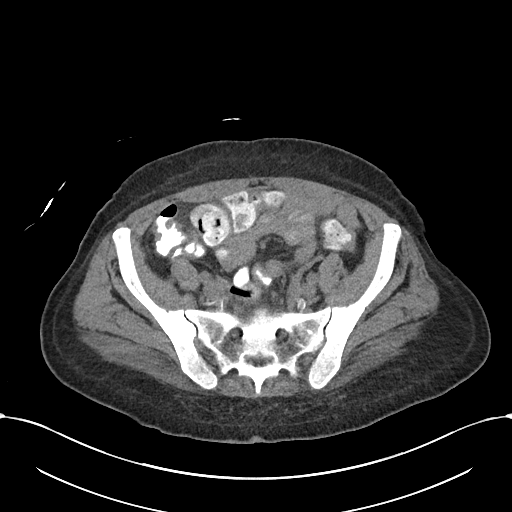
[im 51/98  soft-tissue]
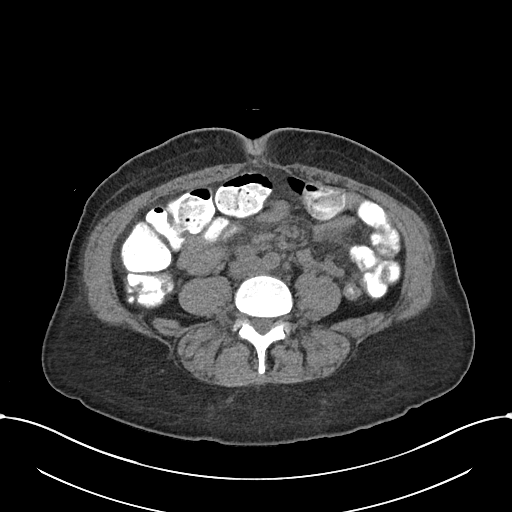
[im 60/98  soft-tissue]
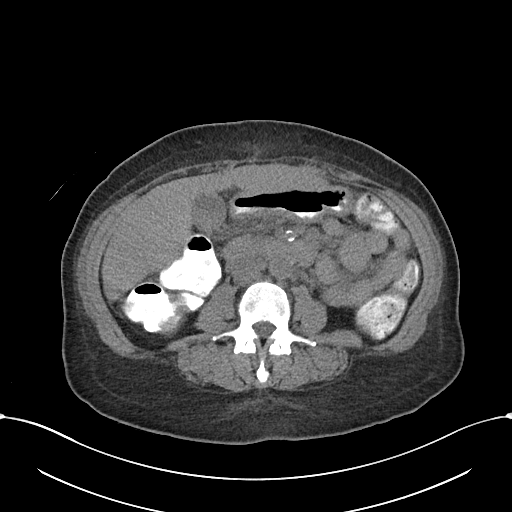
[im 69/98  soft-tissue]
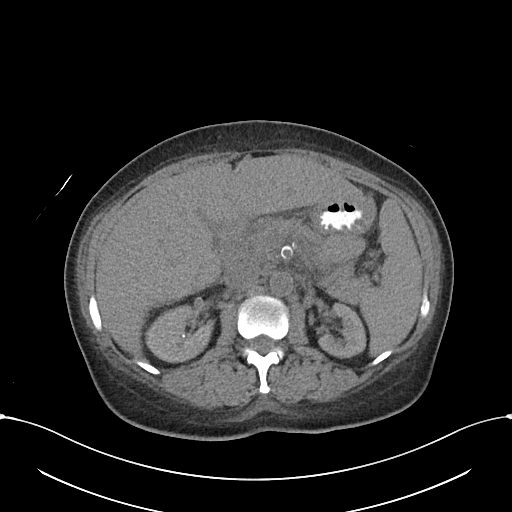
[im 82/98  soft-tissue]
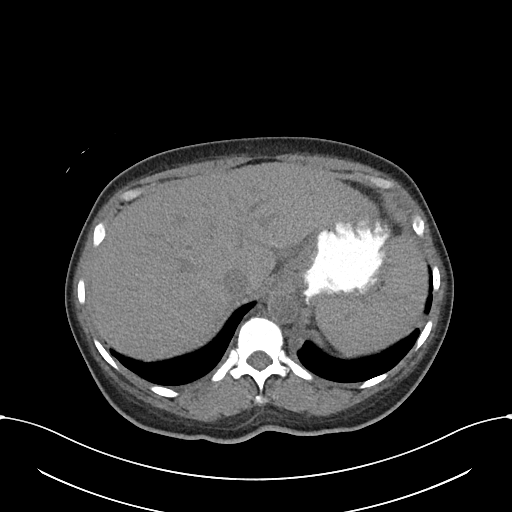
[im 85/98  lung]
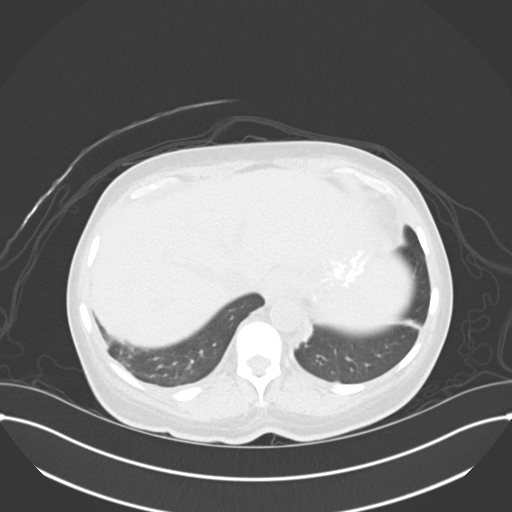
[im 88/98  lung]
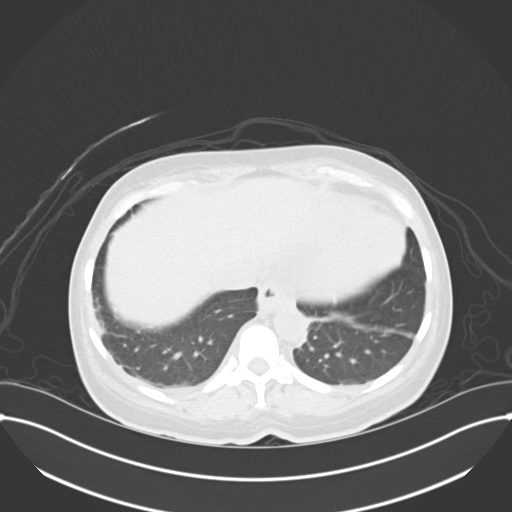
[im 91/98  soft-tissue]
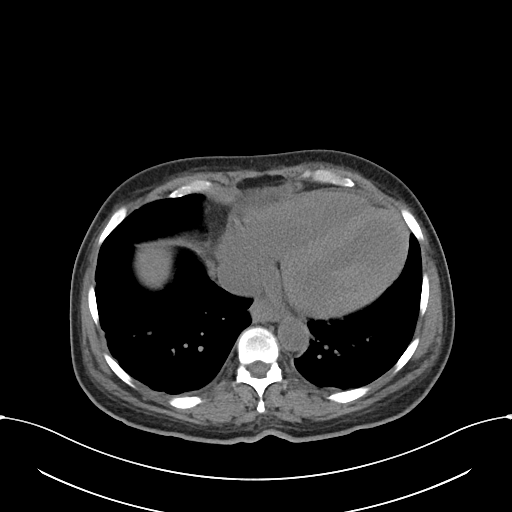
[im 91/98  lung]
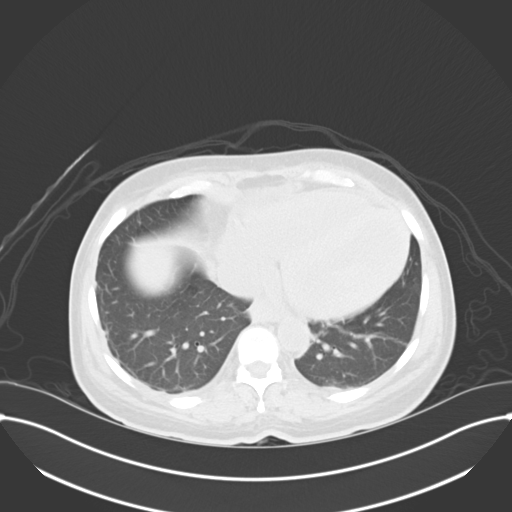
[im 91/98  bone]
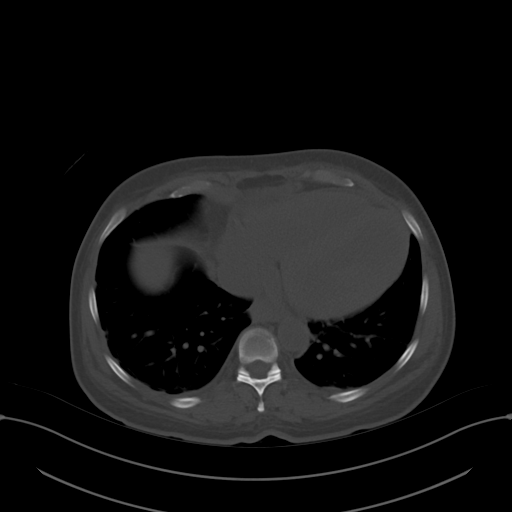
[im 94/98  lung]
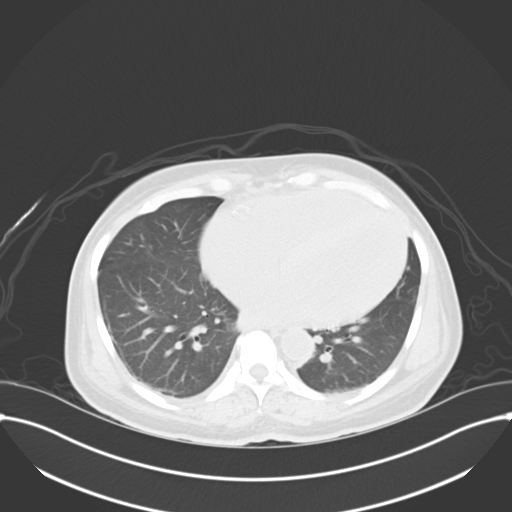

[Series 6: cor · coronal · 0.85mm/px · 3 of 103 slices shown]
[im 35/103  soft-tissue]
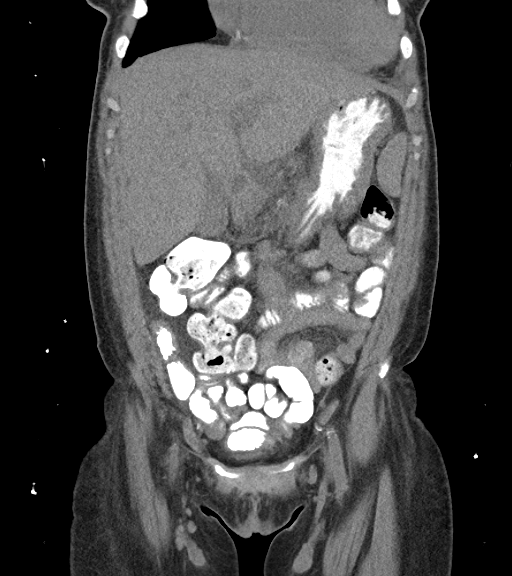
[im 46/103  soft-tissue]
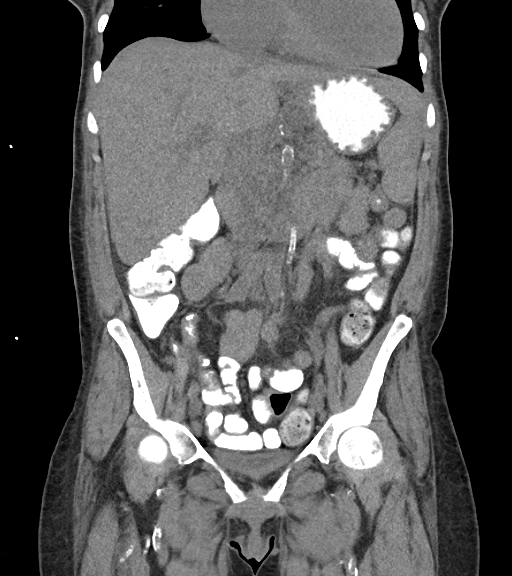
[im 57/103  soft-tissue]
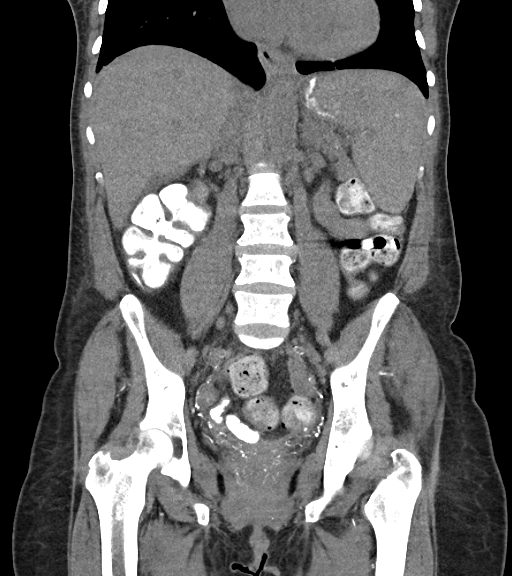

[15 of 42 positions shown; findings below may reference images not displayed]

FINDINGS: Lower chest: Cardiomegaly. Coronary artery calcifications. Decreased
density of the blood pool consistent with anemia. Subpleural linear
atelectasis in both lower lobes. Trace septal thickening at the
bases.

Hepatobiliary: Prominent liver spanning 19.3 cm. No focal
abnormality. Calcified gallstone within partially distended
gallbladder. No pericholecystic inflammation. Common bile duct not
well-defined.

Pancreas: No ductal dilatation or inflammation. More detailed
evaluation is limited in the absence of IV contrast.

Spleen: Upper normal in size spanning 13.5 cm. No focal abnormality.

Adrenals/Urinary Tract: Bilateral renal atrophy. Nonobstructing
stone versus vascular calcification right mid kidney. No perinephric
edema. No adrenal nodule. Decompressed urinary bladder.

Stomach/Bowel: Administered enteric contrast reaches the colon. No
obstruction. Mild wall thickening at the gastroesophageal junction.
Stomach is otherwise unremarkable. No evidence of small or large
bowel wall thickening or inflammation. Normal appendix. Moderate
colonic stool burden. No obvious colonic or small bowel mass.
Capsule located in the descending colon.

Vascular/Lymphatic: Advanced aortic and branch atherosclerosis.
Decreased density of the blood pool consistent with anemia. Limited
assessment for adenopathy given lack of IV contrast. No bulky
adenopathy is seen.

Reproductive: Post hysterectomy. No adnexal mass.

Other: No ascites. No free air.

Musculoskeletal: Sequela of renal osteodystrophy. There are no acute
or suspicious osseous abnormalities.
IMPRESSION: 1. No CT explanation for GI bleed. Mild nonspecific wall thickening
at the gastroesophageal junction, no other bowel wall thickening or
inflammation. No obvious bowel mass. Capsule located in the
descending colon.
2. Moderate colonic stool burden.
3. Cholelithiasis without gallbladder inflammation.
4. Advanced aortic and branch atherosclerosis. Coronary artery
calcifications. Cardiomegaly. Trace septal thickening at the bases
suggest pulmonary edema.
5. Borderline mild hepatosplenomegaly.
6. Additional chronic findings as described.

## 2020-05-05 IMAGING — US US HEPATIC LIVER DOPPLER
1 series · 14 of 25 positions shown · non-contrast
Comparison: CT scan of January 22, 2019.

CLINICAL DATA: Thrombocytopenia.

EXAM:
DUPLEX ULTRASOUND OF LIVER
TECHNIQUE: Color and duplex Doppler ultrasound was performed to evaluate the
hepatic in-flow and out-flow vessels.

[Series 1: us hepatic liver doppler · 14 of 74 slices shown]
[im 1/74]
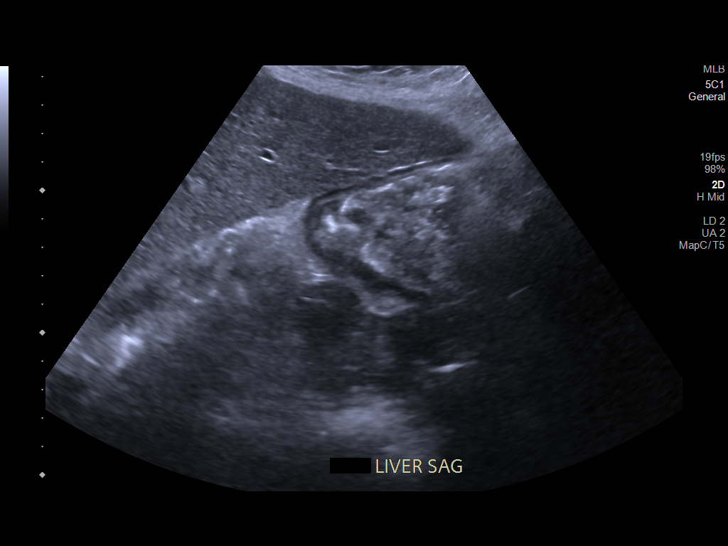
[im 7/74]
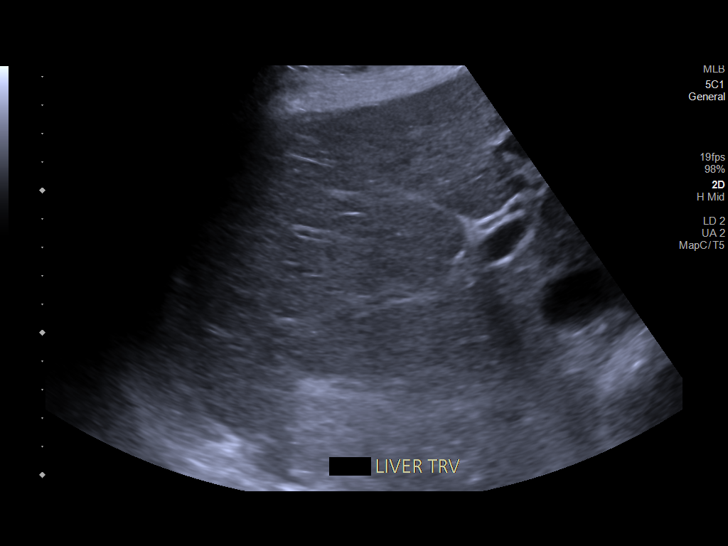
[im 13/74]
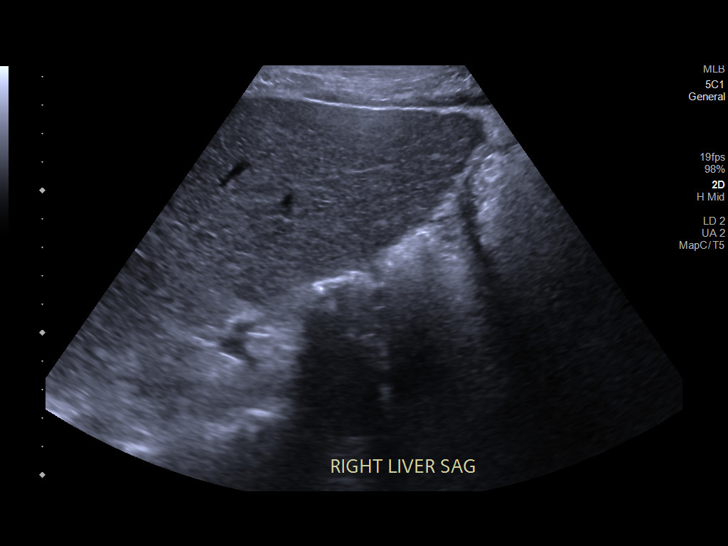
[im 19/74]
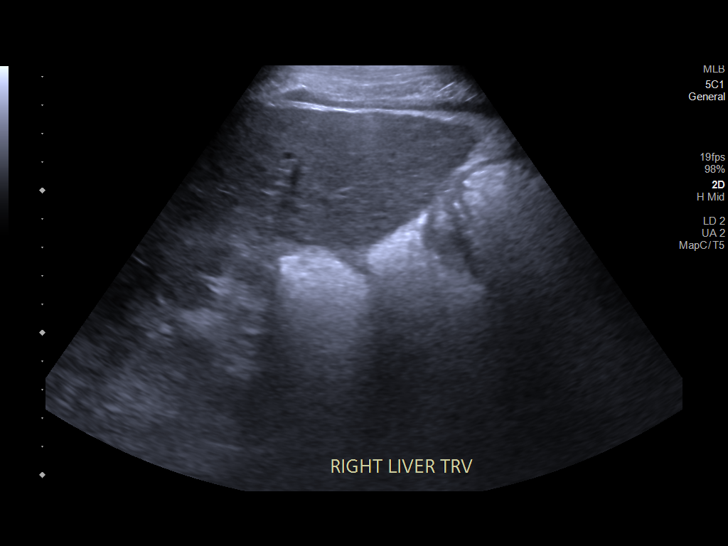
[im 25/74]
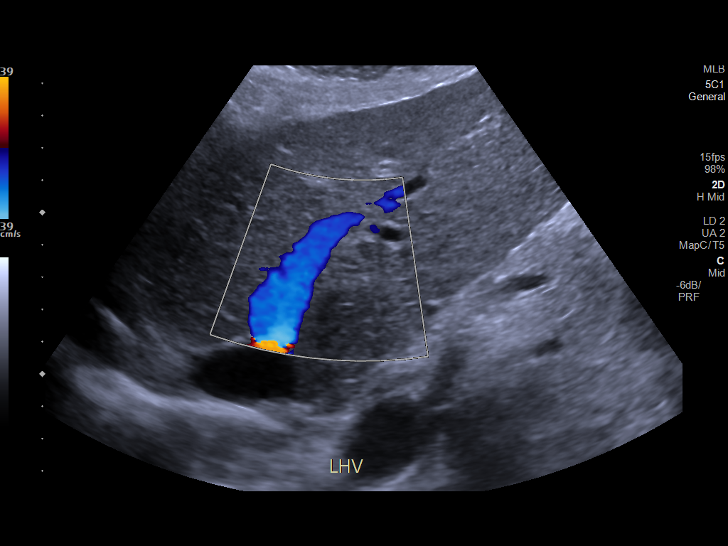
[im 28/74]
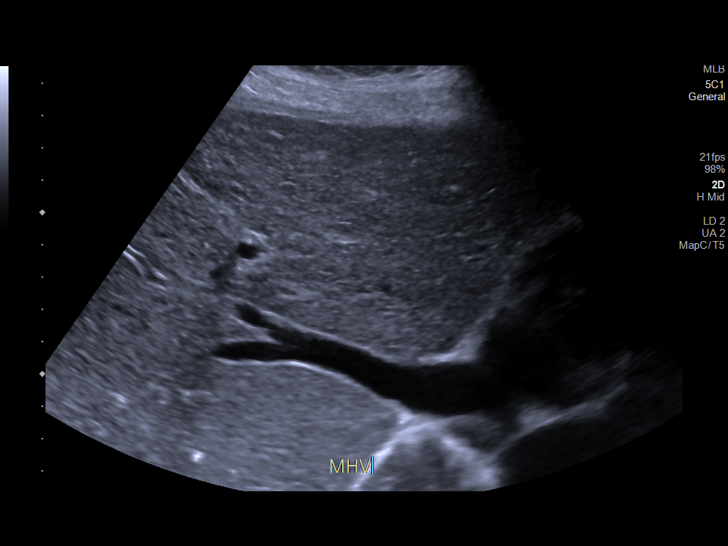
[im 34/74]
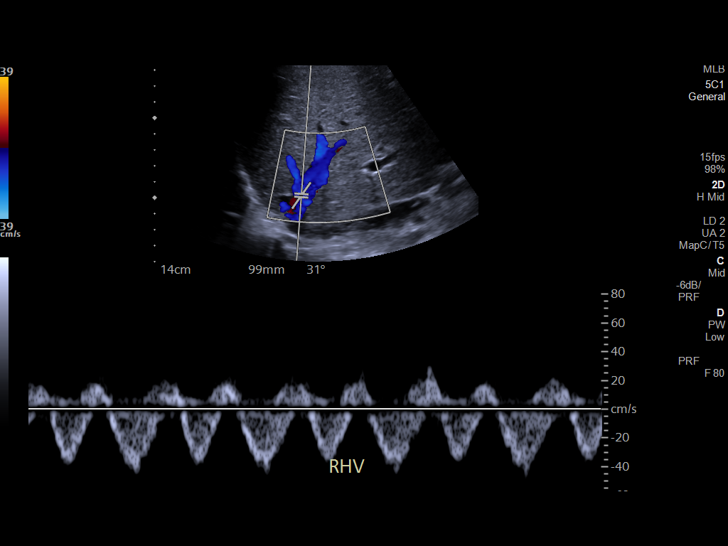
[im 40/74]
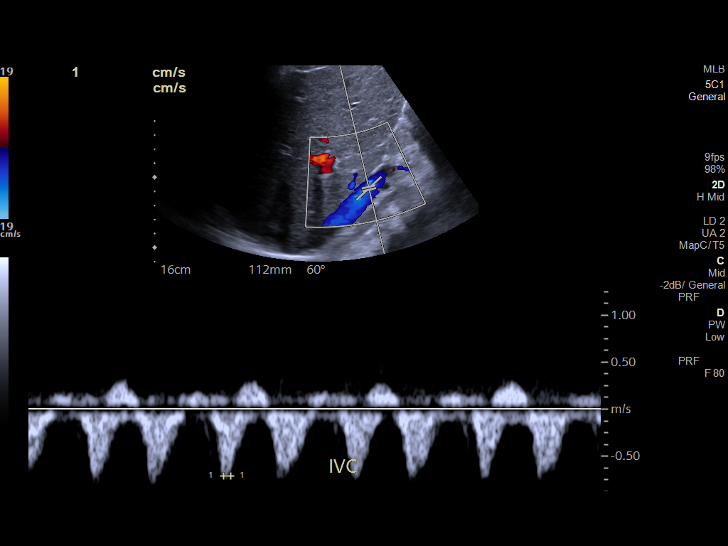
[im 46/74]
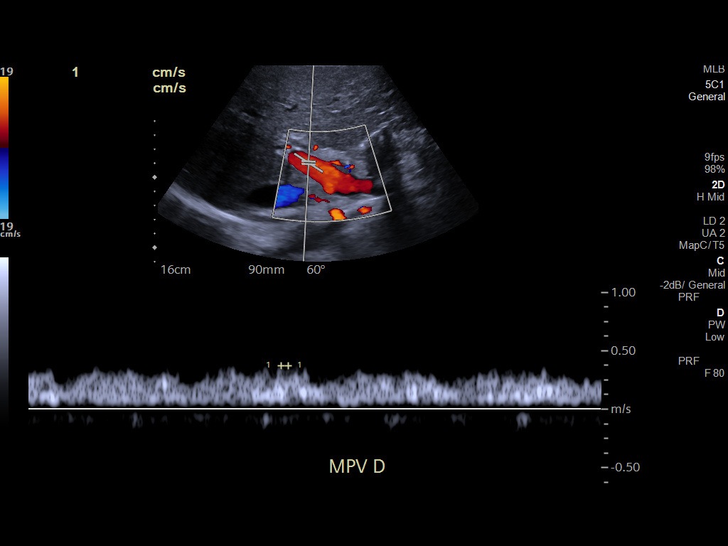
[im 49/74]
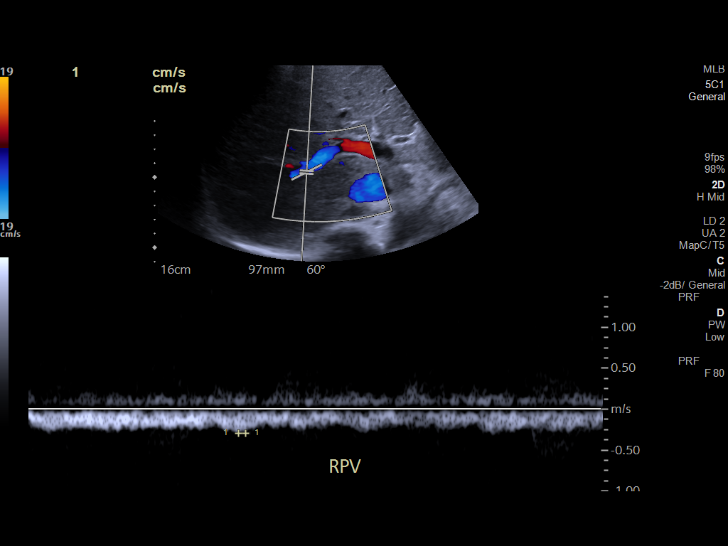
[im 55/74]
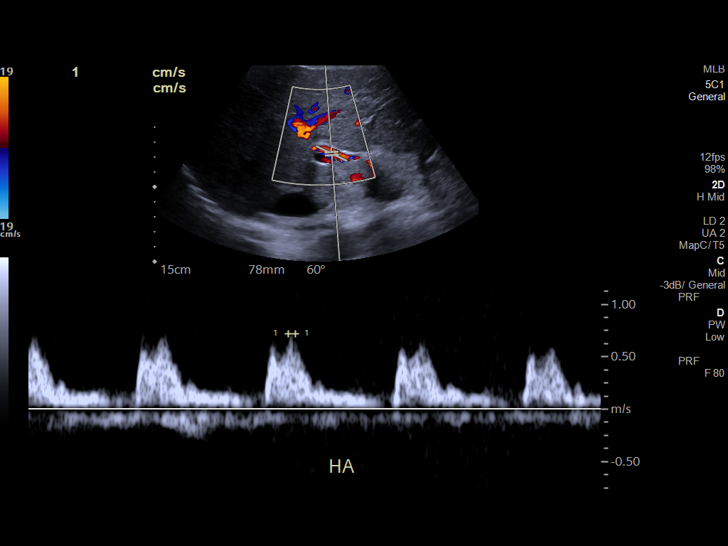
[im 61/74]
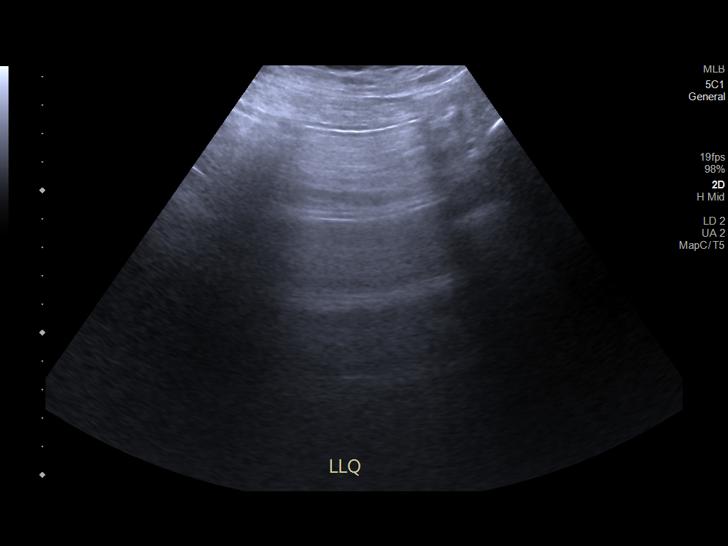
[im 67/74]
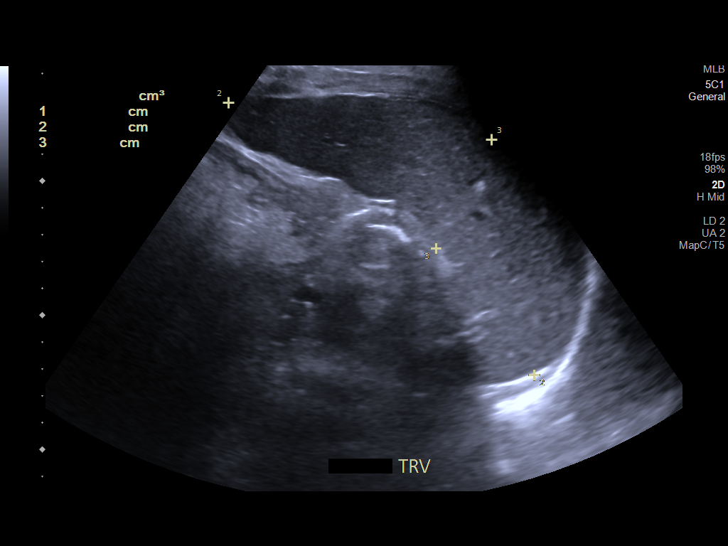
[im 74/74]
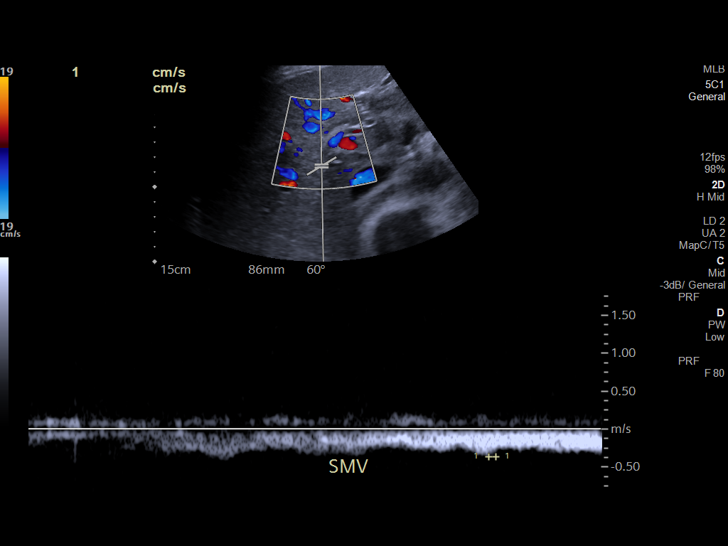

[14 of 25 positions shown; findings below may reference images not displayed]

FINDINGS: Main Portal Vein size: 1.7 cm

Portal Vein Velocities

Main Prox:  29.8 cm/sec

Main Mid: 35.9 cm/sec

Main Dist:  37.1 cm/sec
Right: 44.3 cm/sec
Left: 19.9 cm/sec

Normal hepatopetal flow is noted in the portal veins.

Hepatic Vein Velocities

Right:  45.5 cm/sec

Middle:  62.7 cm/sec

Left:  59.2 cm/sec

Normal hepatofugal flow is noted in the patent veins.

IVC: Present and patent with normal respiratory phasicity.

Hepatic Artery Velocity:  71.9 cm/sec

Splenic Vein Velocity:  23.8 cm/sec

Spleen: 10 point cm x 15.2 cm x 4.6 cm with a total volume of 368
cm^3 (411 cm^3 is upper limit normal)

Portal Vein Occlusion/Thrombus: No

Splenic Vein Occlusion/Thrombus: No

Ascites: Minimal ascites is noted.

Varices: None
IMPRESSION: No evidence of hepatic, portal or splenic venous thrombosis or
occlusion.

Minimal ascites is noted.

## 2020-05-05 IMAGING — US US LIVER ELASTOGRAPHY
1 series · 13 of 25 positions shown · non-contrast
Comparison: CT scan 01/22/2019

CLINICAL DATA: Thrombocytopenia

EXAM:
US LIVER ELASTOGRAPHY
TECHNIQUE: Ultrasound elastography evaluation of the liver was performed. A
region of interest was placed in the right lobe of the liver.
Following application of a compressive sonographic pulse, shear
waves were detected in the adjacent hepatic tissue and the shear
wave velocity was calculated. Multiple assessments were performed at
the selected site. Median shear wave velocity is correlated to a
Metavir fibrosis score.

[Series 1: us liver elastography · 13 of 38 slices shown]
[im 1/38]
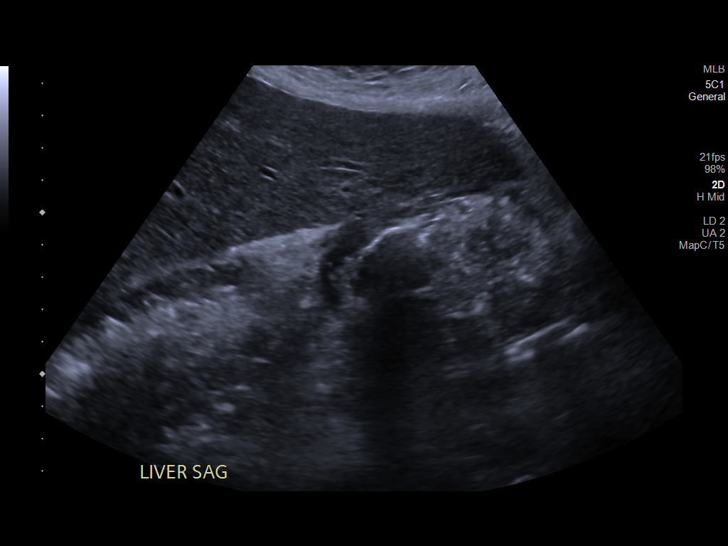
[im 4/38]
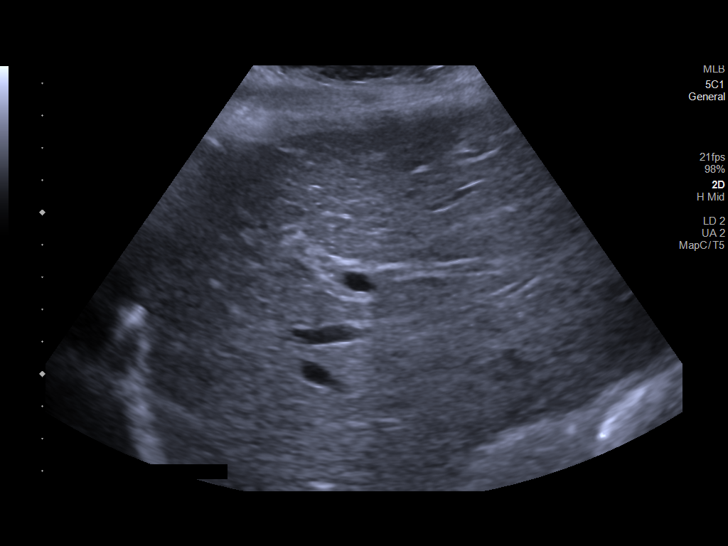
[im 7/38]
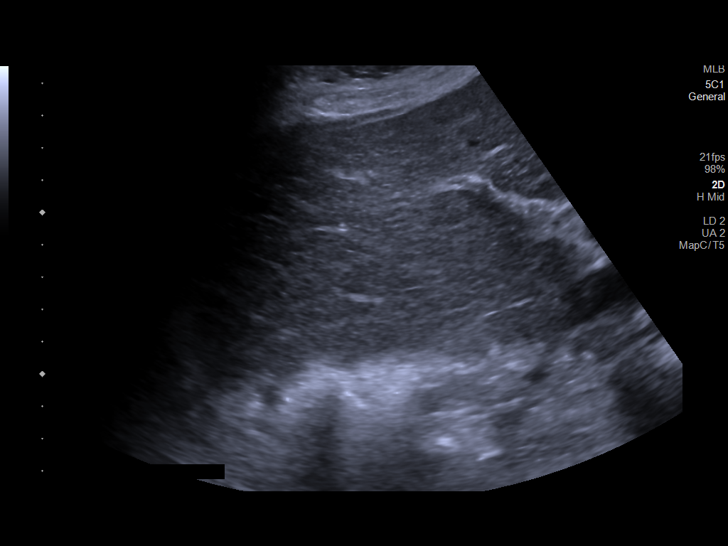
[im 10/38]
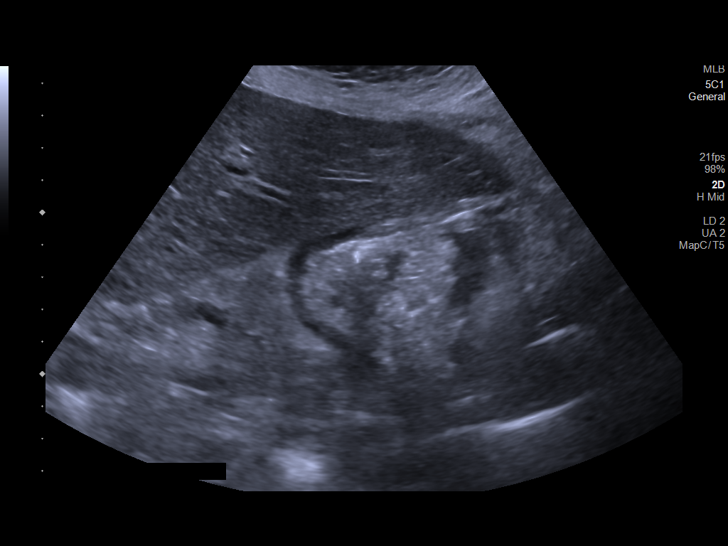
[im 13/38]
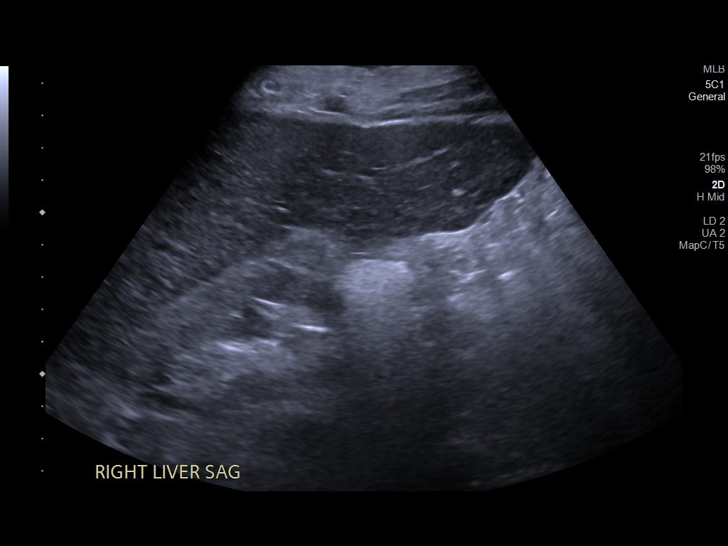
[im 16/38]
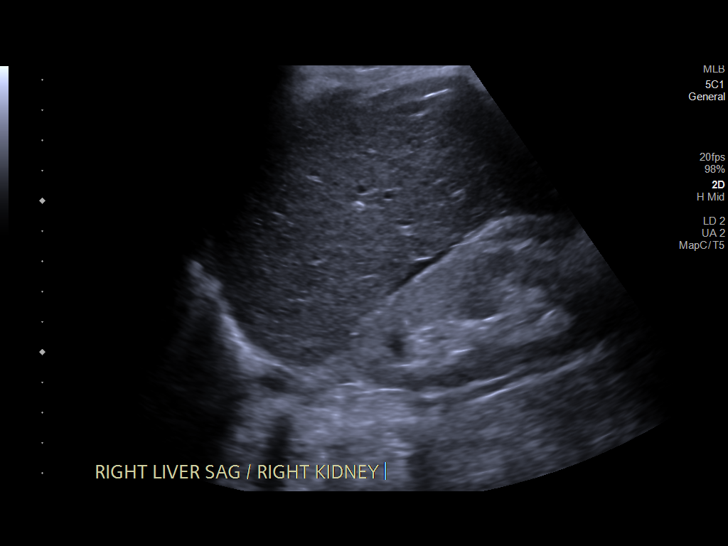
[im 19/38]
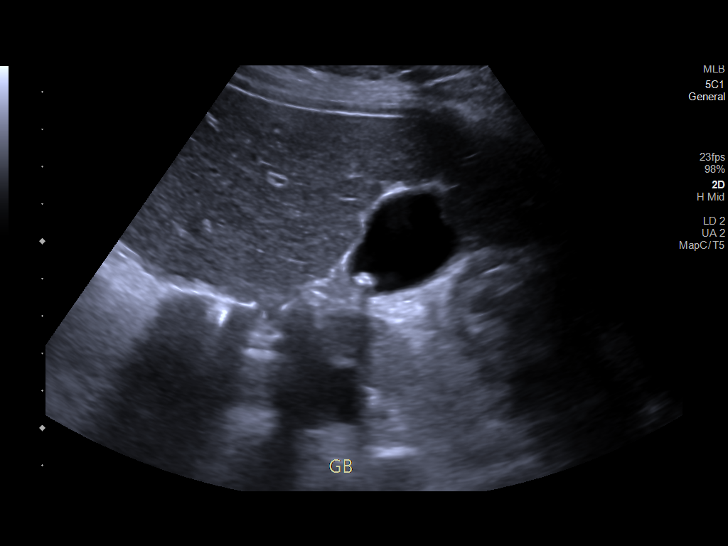
[im 22/38]
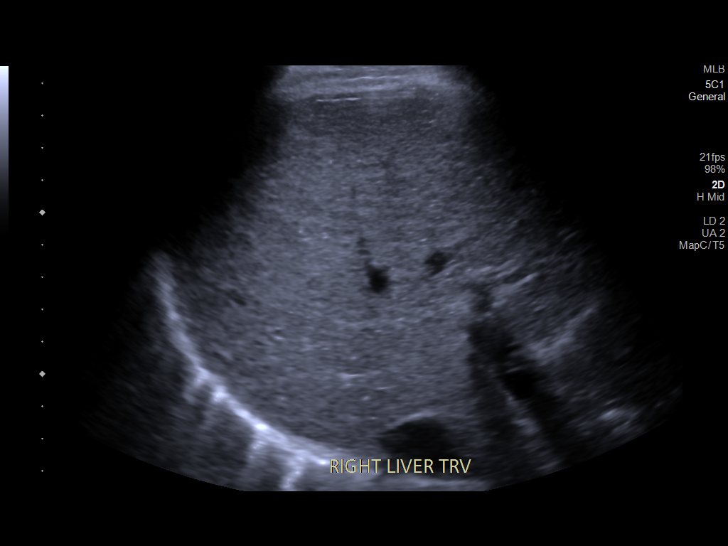
[im 25/38]
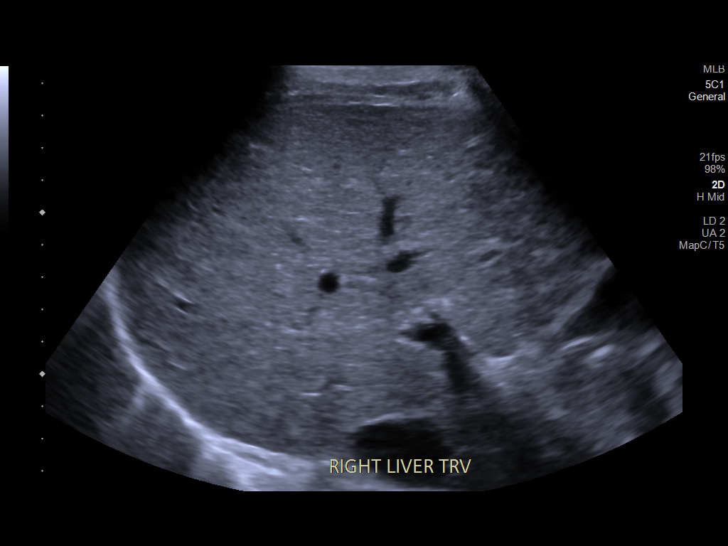
[im 28/38]
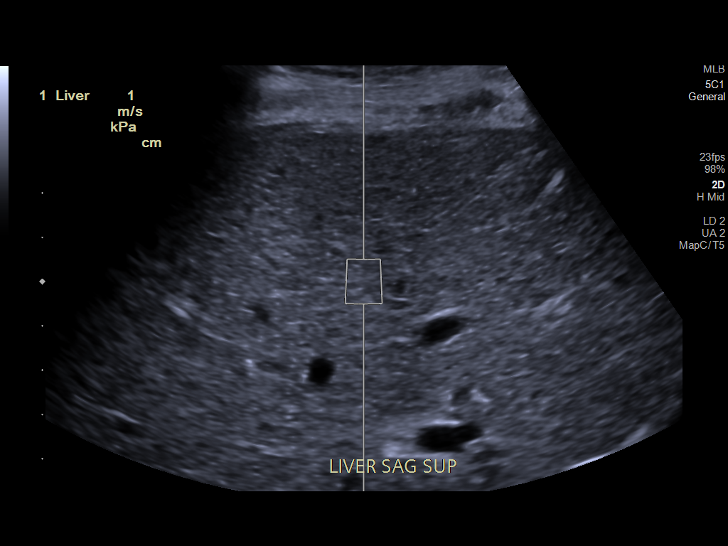
[im 31/38]
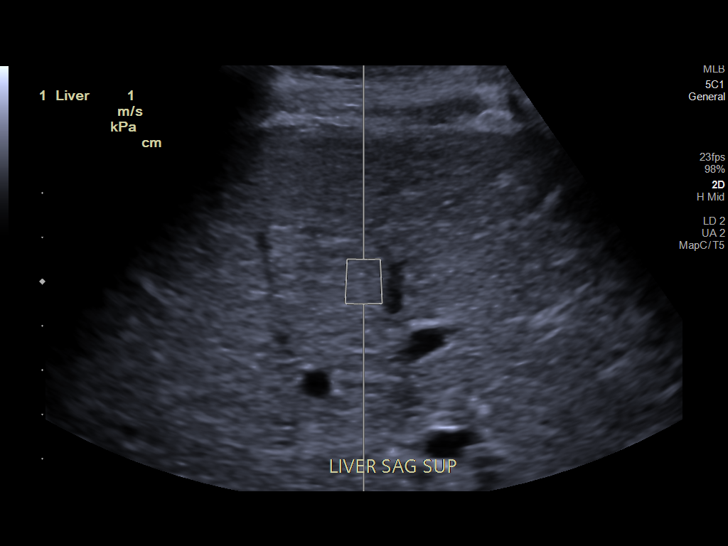
[im 34/38]
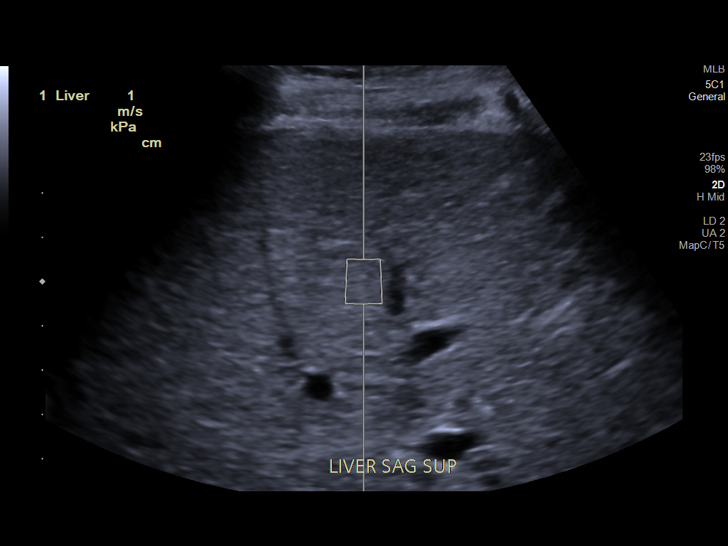
[im 38/38]
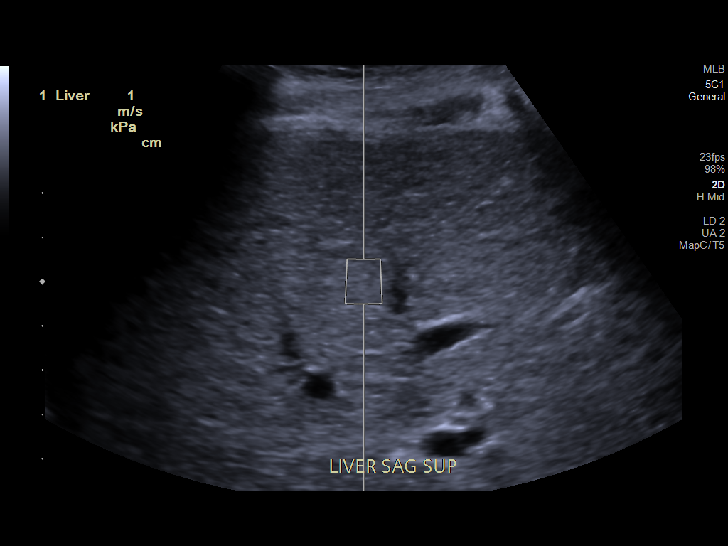

[13 of 25 positions shown; findings below may reference images not displayed]

FINDINGS: Liver: Irregular liver contour and coarse liver echotexture
suggesting cirrhotic changes but no focal hepatic lesions. No
intrahepatic biliary dilatation. Small gallstones are noted in the
gallbladder without findings for acute cholecystitis. Portal vein is
patent on color Doppler imaging with normal direction of blood flow
towards the liver.

ULTRASOUND HEPATIC ELASTOGRAPHY

Device: Siemens Helix VTQ

Patient position: Supine

Transducer: [id number]

Number of measurements: 10

Hepatic segment:  8

Median velocity:   2.28 m/sec

IQR:

IQR/Median velocity ratio:

Corresponding Metavir fibrosis score:  Some F3 + F4

Risk of fibrosis: High

Limitations of exam: None

Please note that abnormal shear wave velocities may also be
identified in clinical settings other than with hepatic fibrosis,
such as: acute hepatitis, elevated right heart and central venous
pressures including use of beta blockers, Bitetto disease
(Tenk), infiltrative processes such as
mastocytosis/amyloidosis/infiltrative tumor, extrahepatic
cholestasis, in the post-prandial state, and liver transplantation.
Correlation with patient history, laboratory data, and clinical
condition recommended.
IMPRESSION: Liver: Cirrhotic appearing changes involving the liver but no focal
hepatic lesions.

Small gallstones are noted.

Elastography: Median hepatic shear wave velocity is calculated at
2.28 m/sec.

Corresponding Metavir fibrosis score is Some F3 + F4.

Risk of fibrosis is High.

Follow-up: Follow up advised

## 2020-06-04 IMAGING — CR CHEST - 2 VIEW
2 series · 2 of 2 positions shown · non-contrast
Comparison: 03/17/2018

CLINICAL DATA: Chest pain and shortness of breath, recently missed
dialysis episode

EXAM:
CHEST - 2 VIEW

[chest lat]
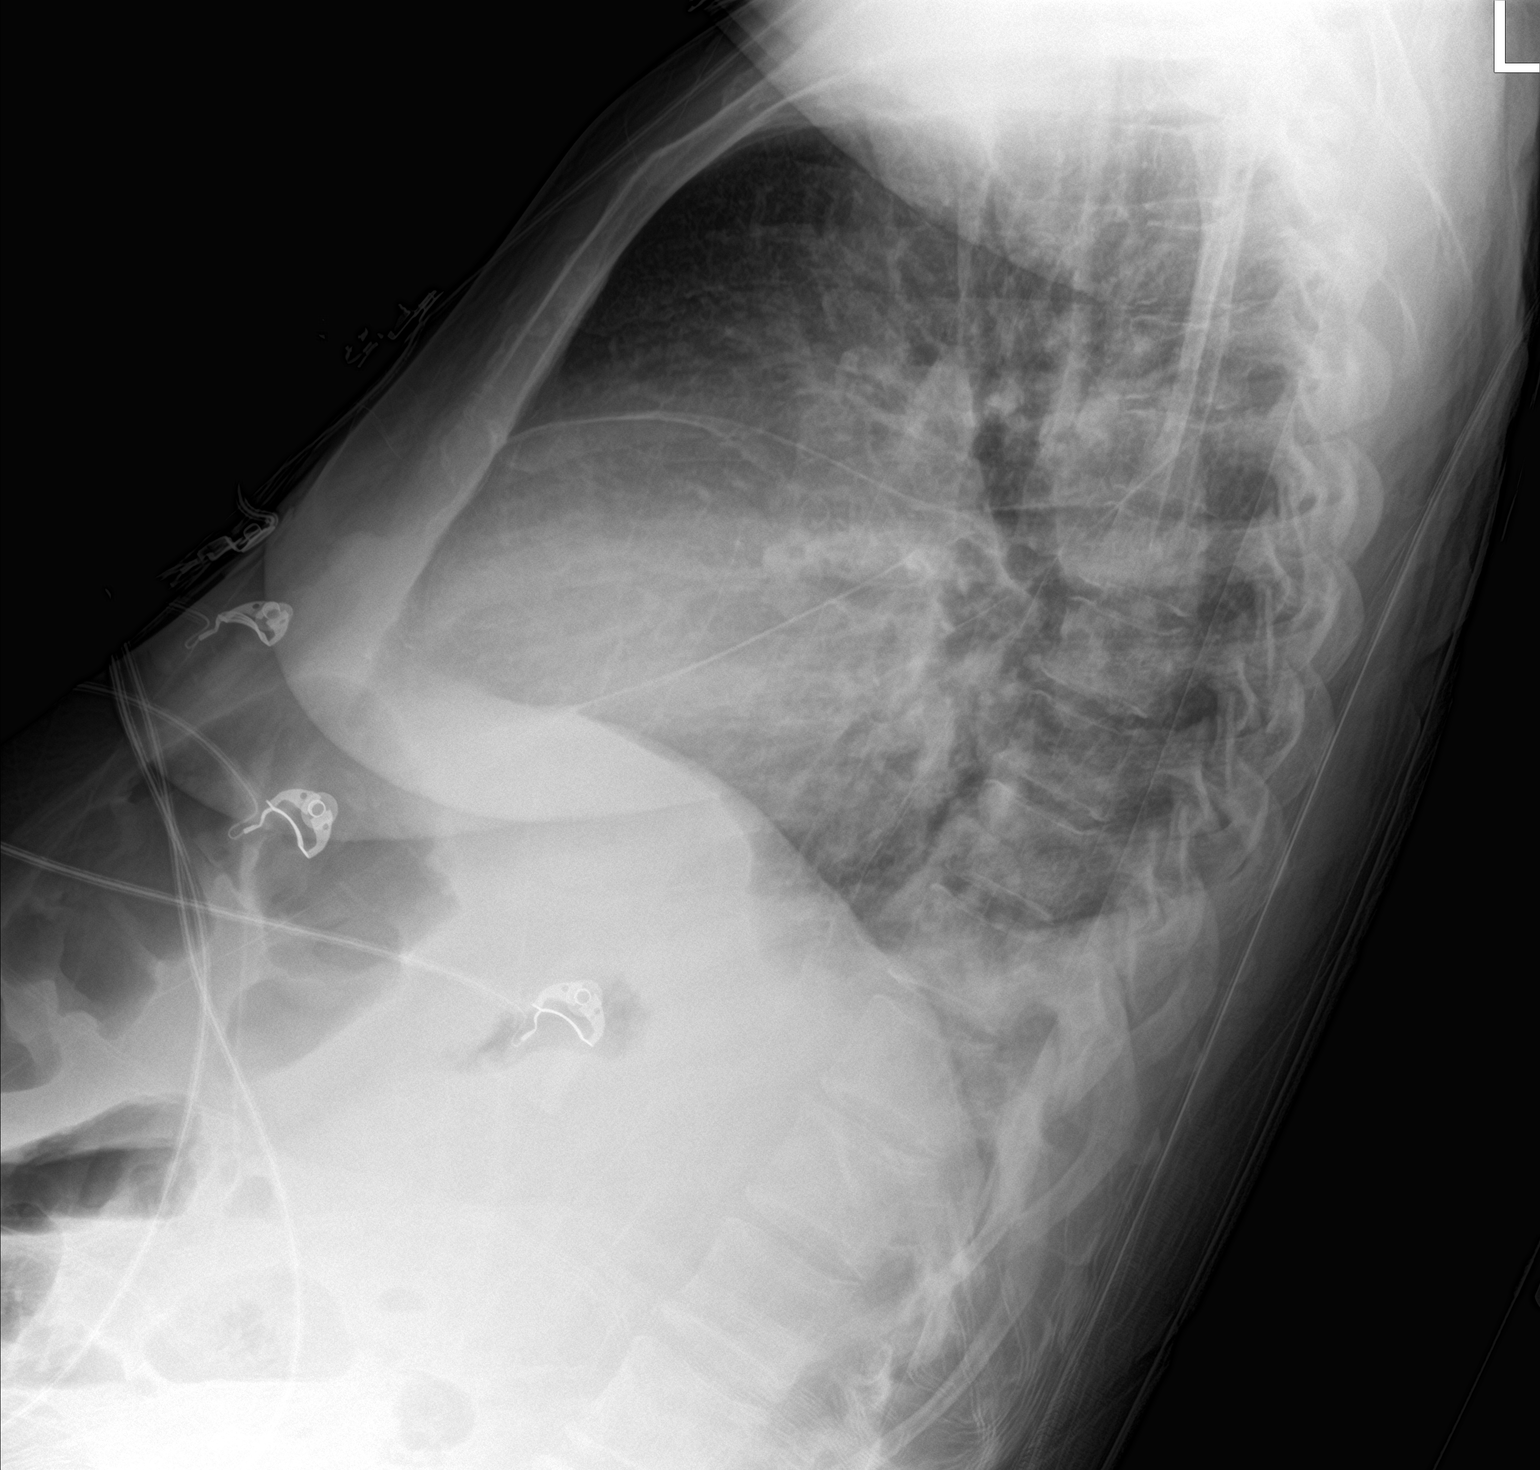

[chest ap]
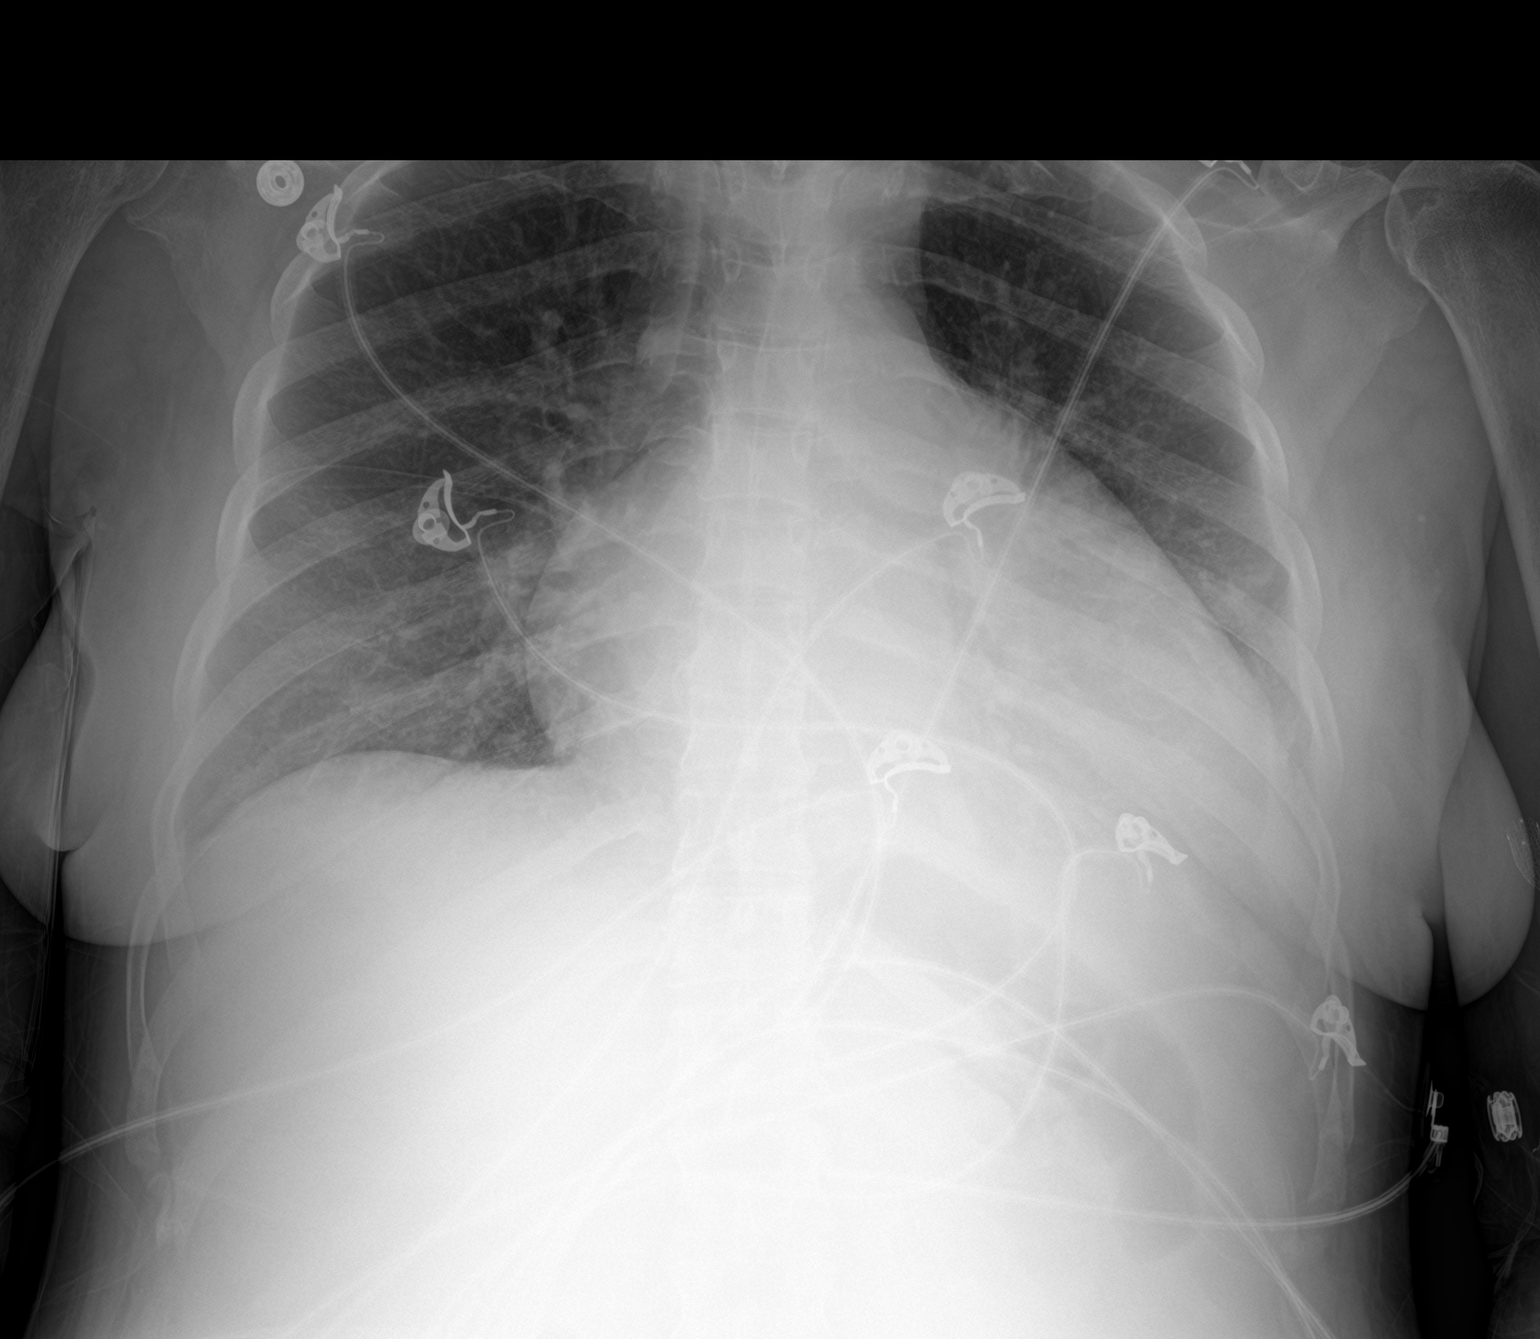

[2 of 2 positions shown; findings below may reference images not displayed]

FINDINGS: Cardiac shadow is enlarged but stable consistent with a degree of
volume overload. Mild vascular congestion is again seen without
significant edema. Chronic blunting of left costophrenic angle is
noted. No focal infiltrate is seen.
IMPRESSION: Mild vascular congestion consistent with the recently missed
dialysis episode.

Chronic changes in the left base.

## 2020-07-11 IMAGING — CT CT ANGIOGRAPHY ABDOMEN AND PELVIS WITH CONTRAST AND WITHOUT CONT
3 of 12 series · 11 of 46 positions shown, 16 images · IV contrast (APPLIED)
Comparison: Pelvis CT of the abdomen pelvis dated 01/22/2019

CLINICAL DATA: 50-year-old female with GI bleed.

EXAM:
CTA ABDOMEN AND PELVIS wITHOUT AND WITH CONTRAST
TECHNIQUE: Multidetector CT imaging of the abdomen and pelvis was performed
using the standard protocol during bolus administration of
intravenous contrast. Multiplanar reconstructed images and MIPs were
obtained and reviewed to evaluate the vascular anatomy.
CONTRAST:  100mL OMNIPAQUE IOHEXOL 350 MG/ML SOLN

[Series 8: cor · coronal · 0.73mm/px · 2 of 150 slices shown, 3 images]
[im 50/150  soft-tissue]
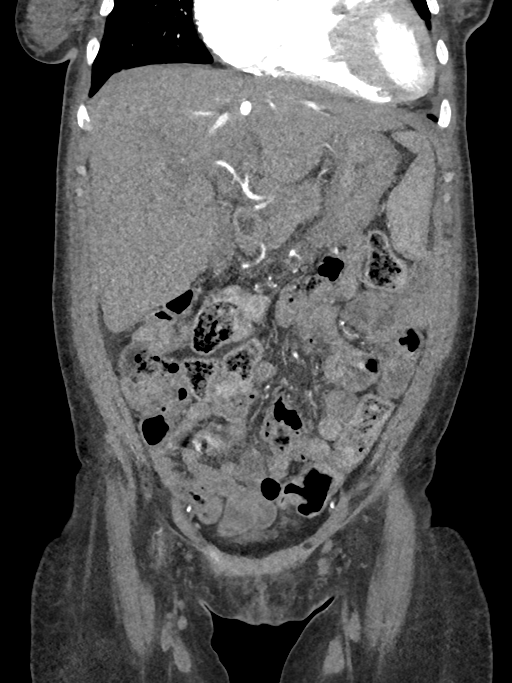
[im 50/150  bone]
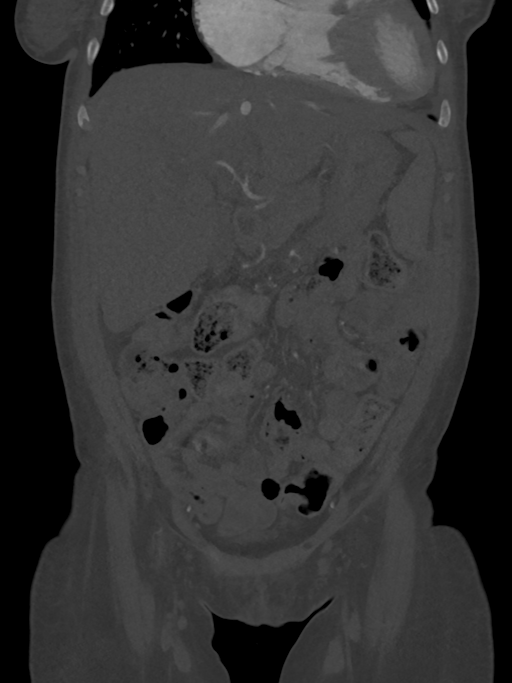
[im 100/150  soft-tissue]
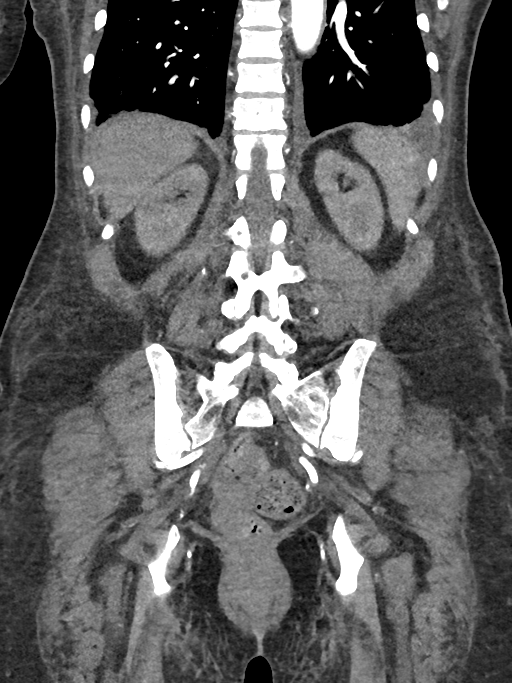

[Series 12: venous · axial · portal-venous · 0.90mm/px · z∈[+784,+1284]mm · 3 of 251 slices shown, 7 images]
[im 1/251  soft-tissue]
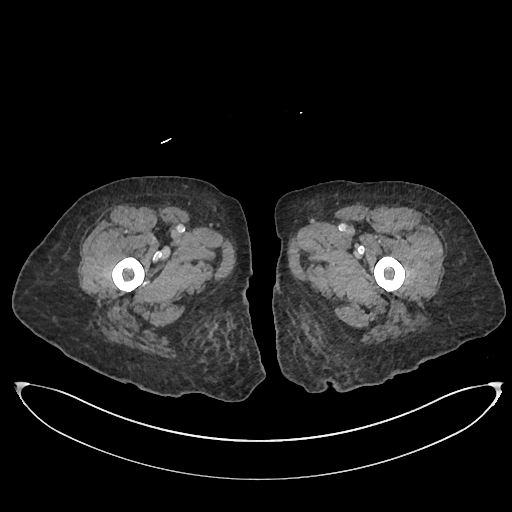
[im 1/251  lung]
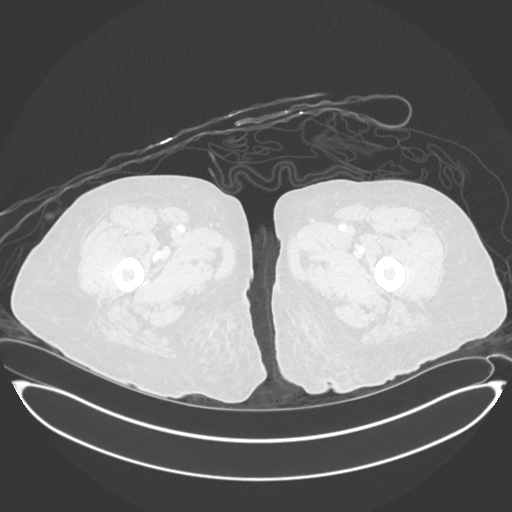
[im 1/251  bone]
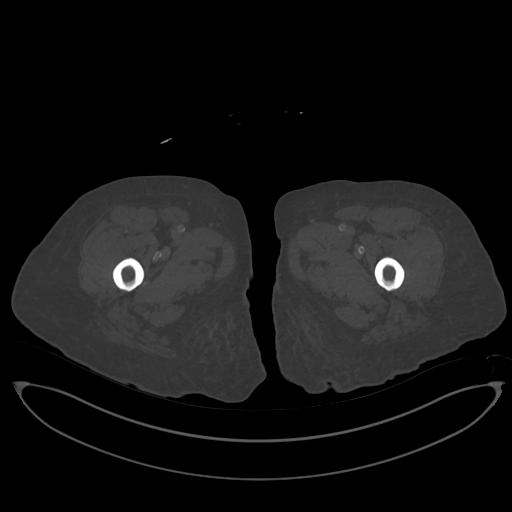
[im 126/251  soft-tissue]
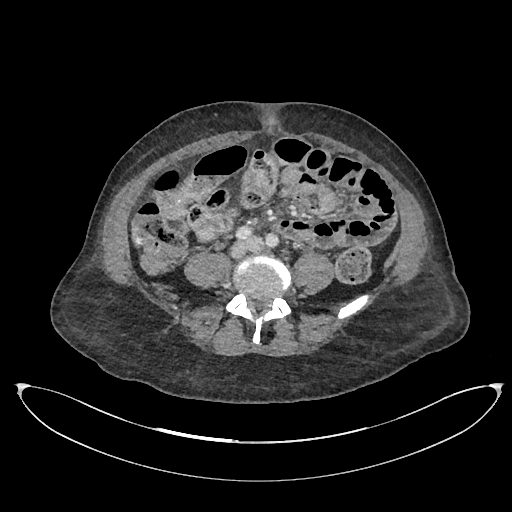
[im 126/251  lung]
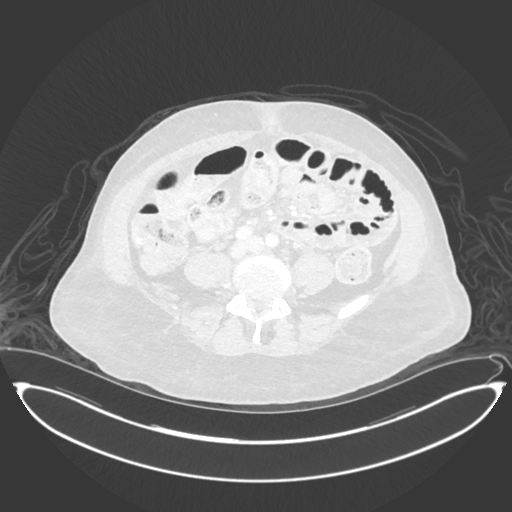
[im 251/251  soft-tissue]
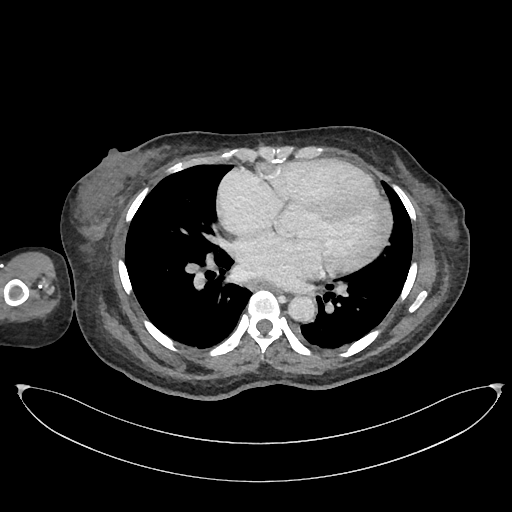
[im 251/251  lung]
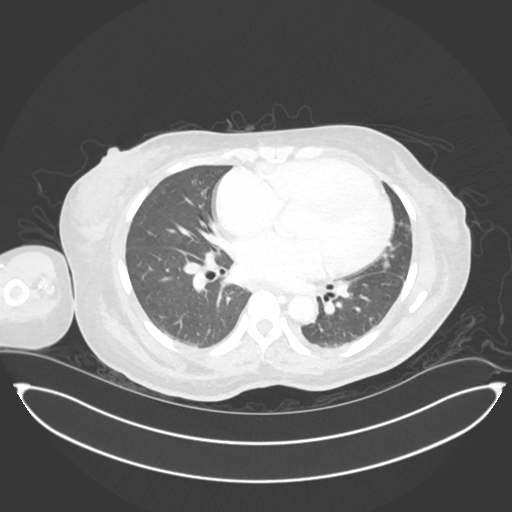

[Series 13: venous thins · axial · portal-venous · 0.90mm/px · z∈[+826,+1117]mm · 6 of 1251 slices shown]
[im 105/1251  soft-tissue]
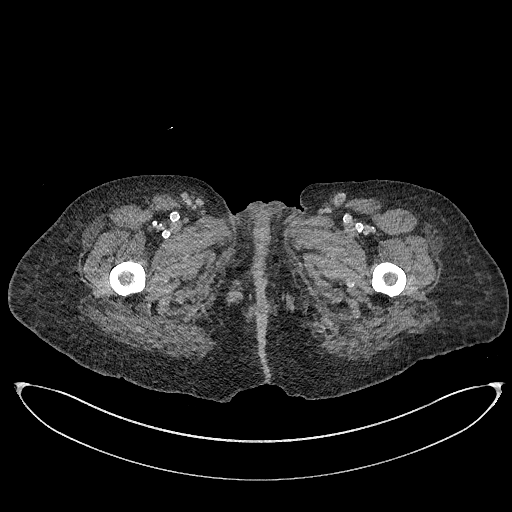
[im 313/1251  soft-tissue]
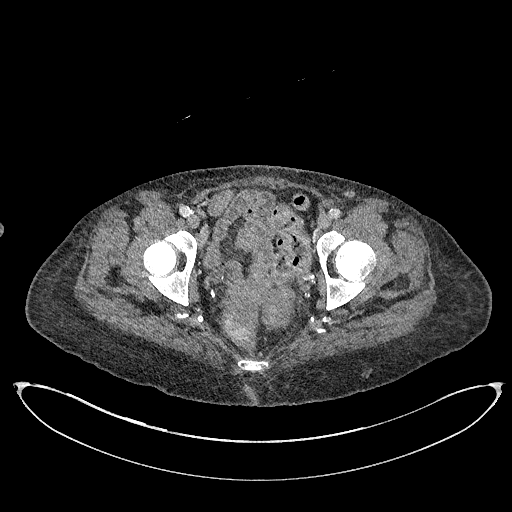
[im 417/1251  soft-tissue]
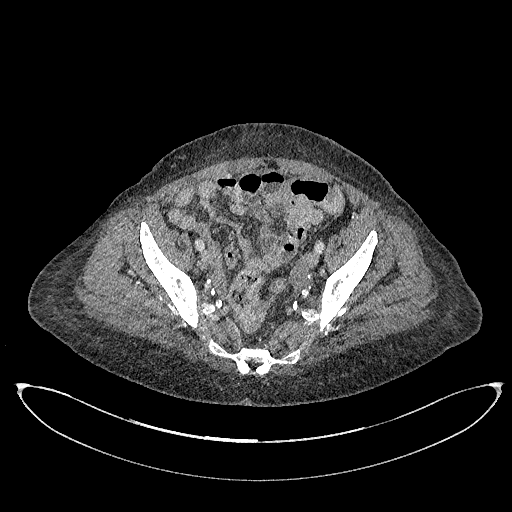
[im 521/1251  soft-tissue]
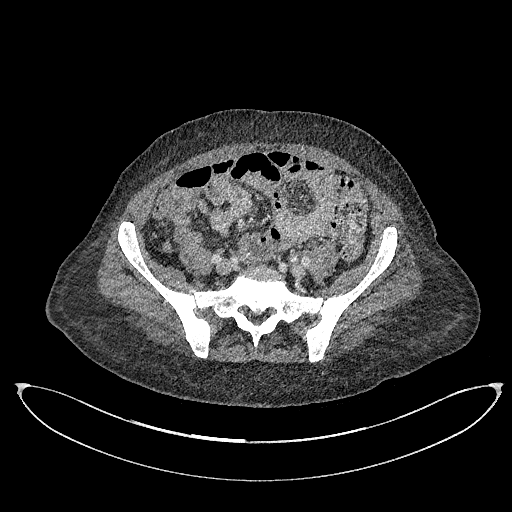
[im 730/1251  soft-tissue]
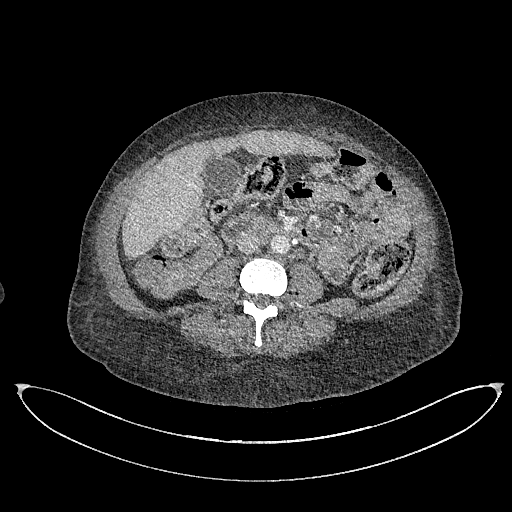
[im 834/1251  soft-tissue]
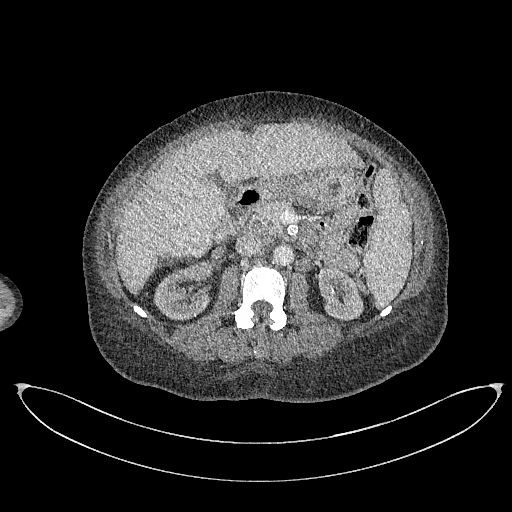

[11 of 46 positions shown; findings below may reference images not displayed]

FINDINGS: VASCULAR

Aorta: Normal caliber aorta without aneurysm, dissection, vasculitis
or significant stenosis.

Celiac: Patent without evidence of aneurysm, dissection, vasculitis
or significant stenosis.

SMA: Mild atherosclerotic calcification SMA. The SMA is widely
patent.

Renals: Both renal arteries are patent without evidence of aneurysm,
dissection, vasculitis, fibromuscular dysplasia or significant
stenosis.

IMA: Patent without evidence of aneurysm, dissection, vasculitis or
significant stenosis.

Inflow: Patent without evidence of aneurysm, dissection, vasculitis
or significant stenosis. Atherosclerotic calcification of the iliac
arteries.

Proximal Outflow: Moderate atherosclerotic calcification of the
visualized femoral arteries. The femoral arteries are patent.

Veins: The SMV, splenic vein, and main portal vein are patent. The
IVC is unremarkable. No portal venous gas.

Review of the MIP images confirms the above findings.

NON-VASCULAR

Lower chest: There is mild cardiomegaly. There is retrograde flow of
contrast from the right atrium into the IVC suggestive of right
heart dysfunction. There are bibasilar linear atelectasis/scarring.
Small bilateral pleural effusions with associated mild compressive
atelectasis of the lower lobes.

No intra-abdominal free air.  Small subhepatic free fluid.

Hepatobiliary: Morphologic changes of cirrhosis. There is moderate
periportal edema there is a small stone in the gallbladder. Small
amount of fluid adjacent to the gallbladder may be related to
underlying liver disease. Ultrasound may provide better evaluation
the gallbladder if there is high clinical concern for acute
cholecystitis.

Pancreas: The pancreas is grossly unremarkable as visualized.

Spleen: Normal in size without focal abnormality.

Adrenals/Urinary Tract: The adrenal glands are unremarkable. Mild
bilateral renal parenchyma atrophy. Probable punctate nonobstructing
right renal interpolar calculus versus vascular calcification. There
is no hydronephrosis on either side. The urinary bladder is
collapsed.

Stomach/Bowel: There is moderate stool throughout the colon.
Evaluation for GI bleed is limited as non-contrast images are not
provided. No definite active bleed identified. There is no evidence
of bowel obstruction or active inflammation. The appendix is normal.

Lymphatic: No definite adenopathy. Top-normal bilateral inguinal
lymph nodes, likely reactive.

Reproductive: Hysterectomy. No pelvic mass.

Other: Mild diffuse subcutaneous edema. No fluid collection.

Musculoskeletal: No acute or significant osseous findings.
IMPRESSION: 1. No acute intra-abdominal or pelvic pathology. No CT evidence of
active GI bleed.
2. Cirrhosis and small subhepatic ascites.
3. Cholelithiasis. Ultrasound may provide better evaluation the
gallbladder if there is high clinical concern for acute
cholecystitis.
4. Moderate colonic stool burden. No bowel obstruction or active
inflammation. Normal appendix.
5. Small bilateral pleural effusions with associated mild
compressive atelectasis of the lower lobes.

## 2020-08-21 IMAGING — DX DG ABDOMEN ACUTE W/ 1V CHEST
3 series · 3 of 3 positions shown · non-contrast
Comparison: CT abdomen pelvis dated April 02, 2019. Chest x-ray
dated April 01, 2019.

CLINICAL DATA: Chest pain, shortness of breath, nausea, and
vomiting.

EXAM:
DG ABDOMEN ACUTE W/ 1V CHEST

[abdomen supine (1 of 2)]
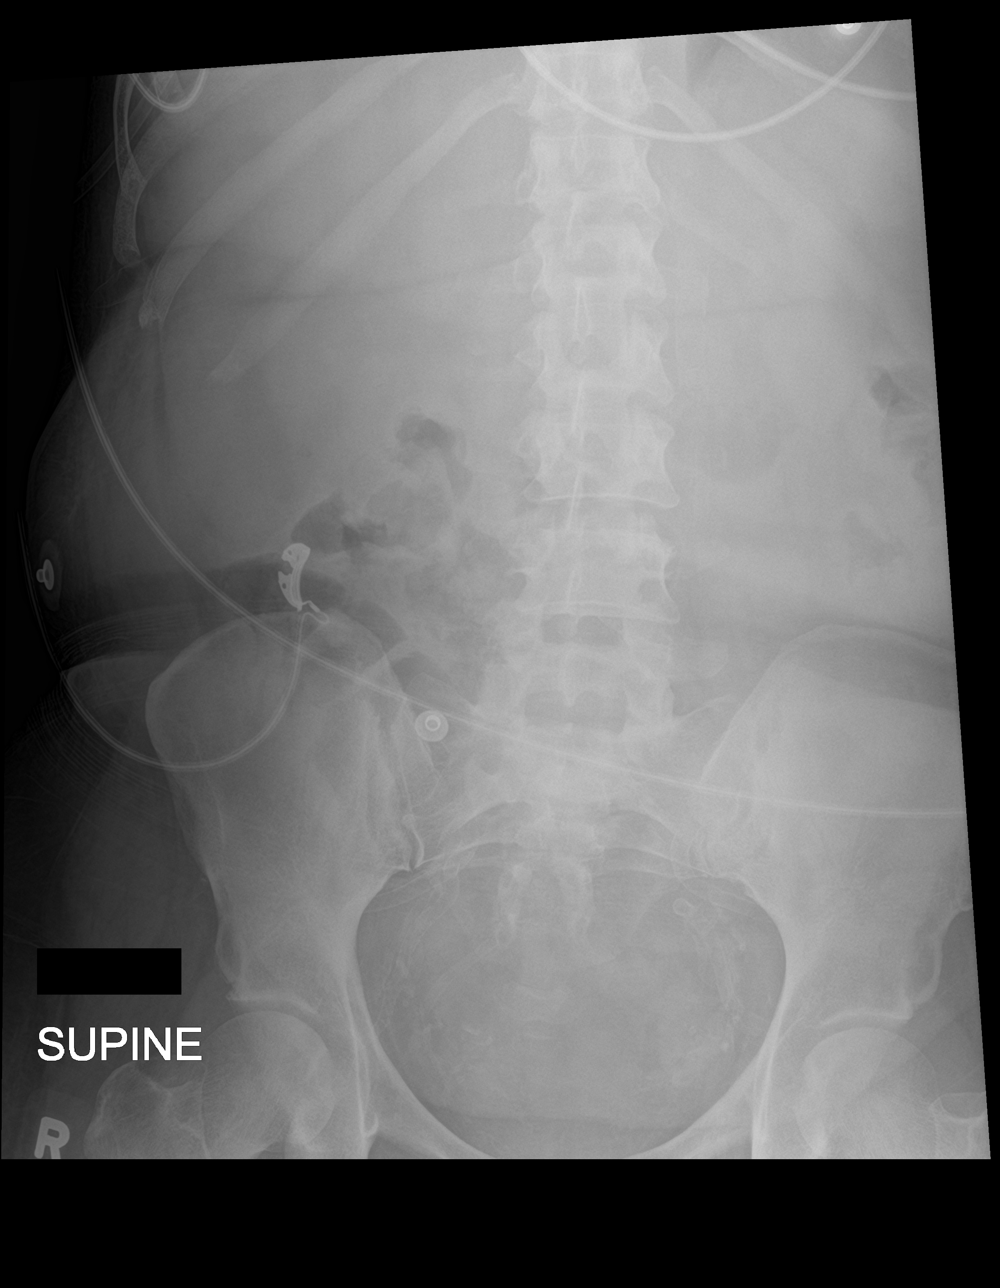

[abdomen supine (2 of 2)]
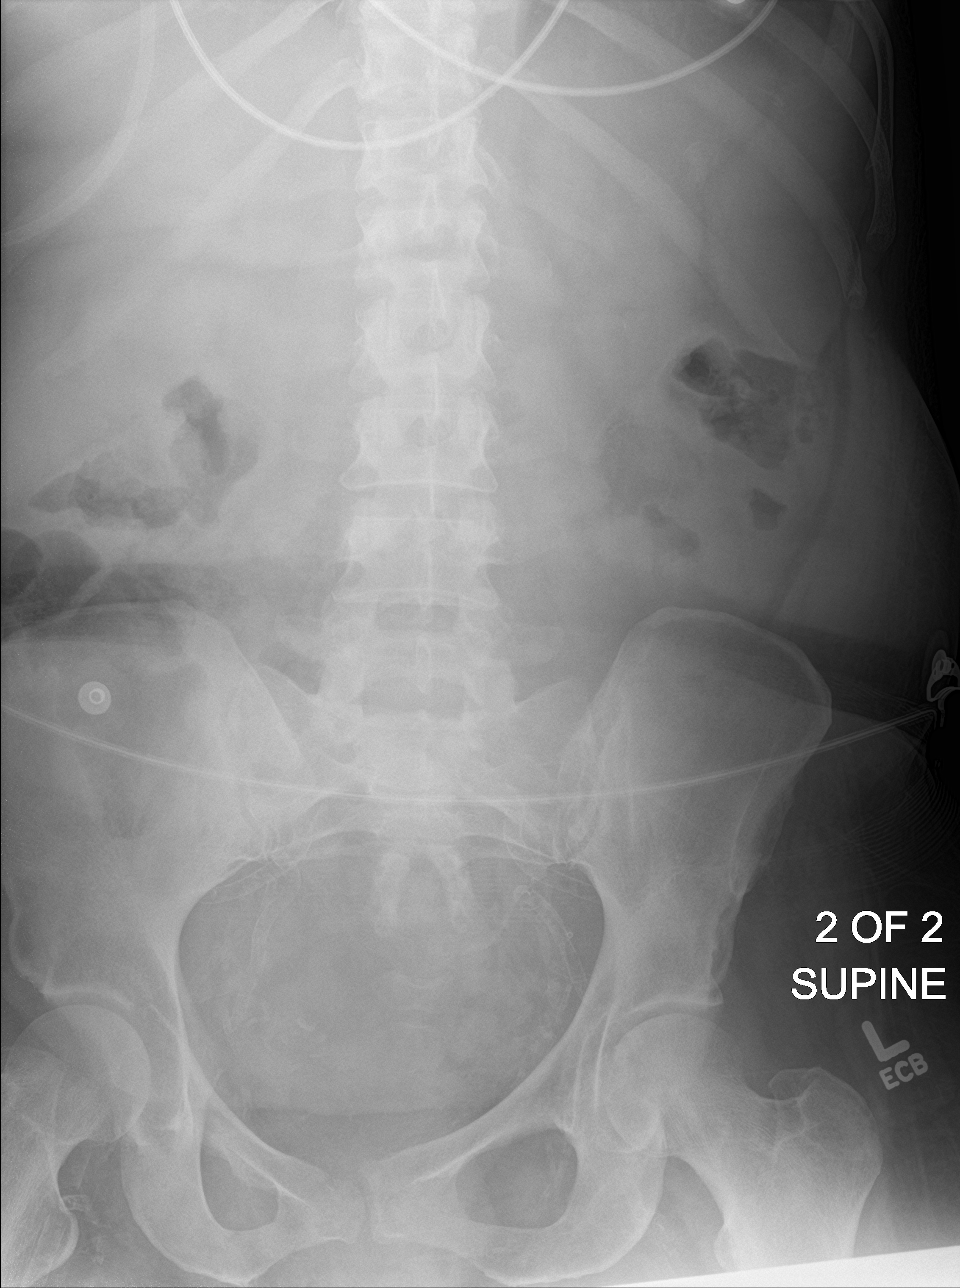

[abdomen decu]
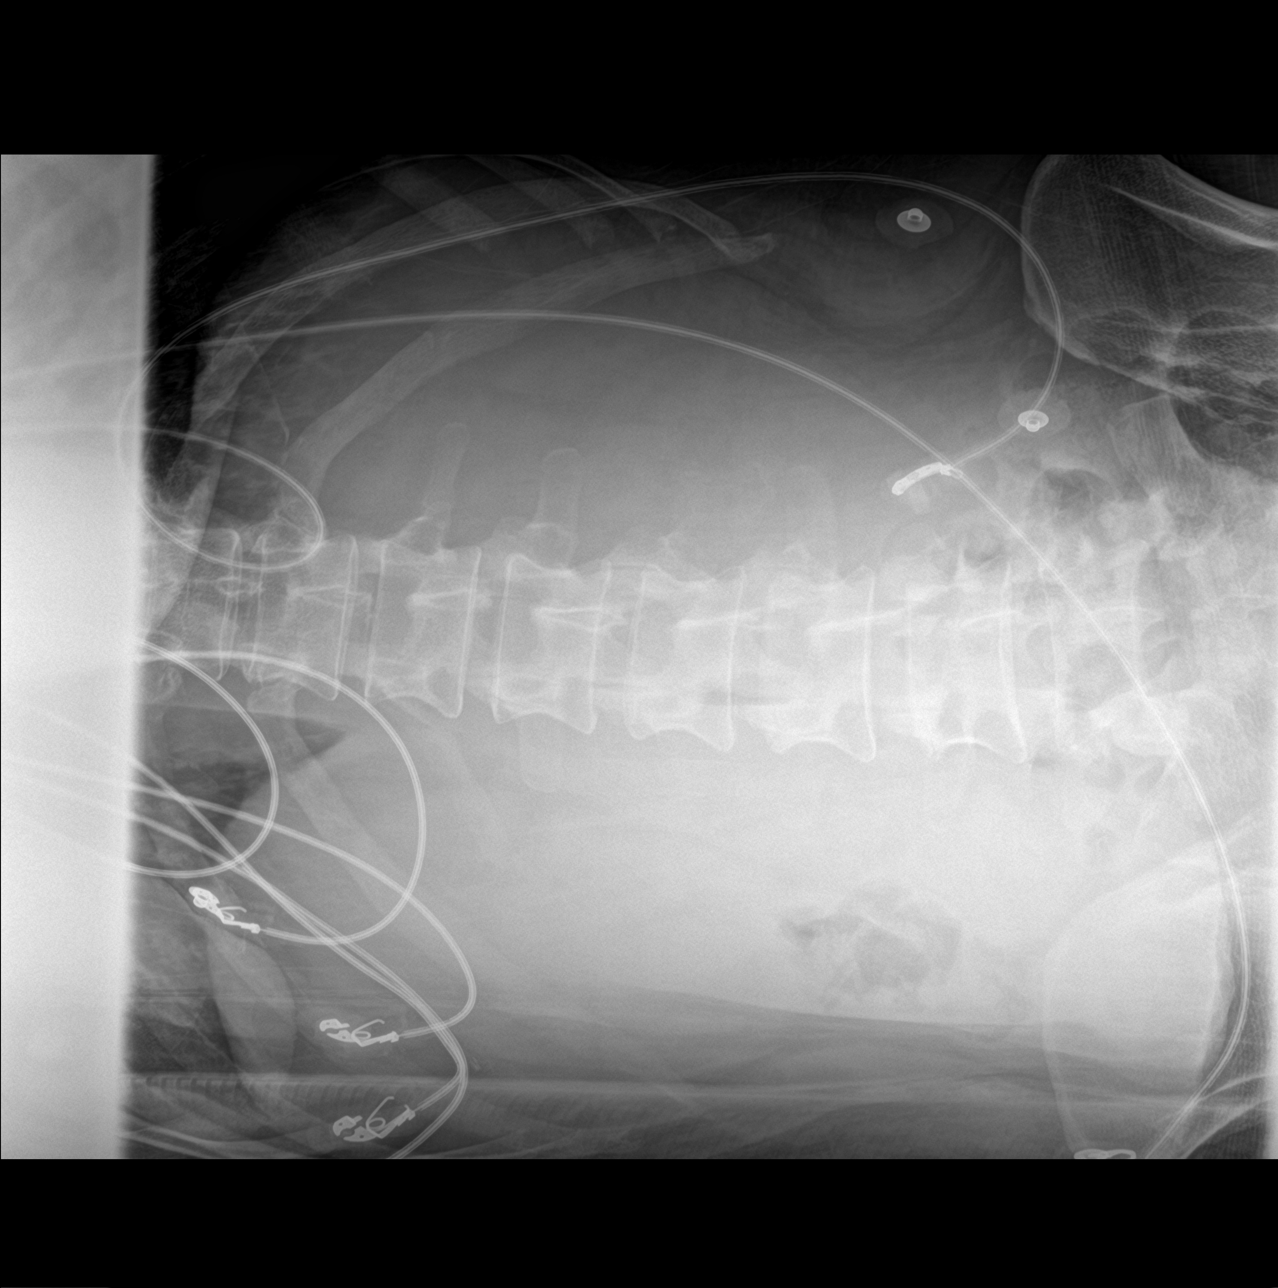

[3 of 3 positions shown; findings below may reference images not displayed]

FINDINGS: Unchanged moderate cardiomegaly. Normal pulmonary vascularity. No
focal consolidation, pleural effusion, or pneumothorax.

There is no evidence of dilated bowel loops or free intraperitoneal
air. No radiopaque calculi or other significant radiographic
abnormality is seen.

No acute osseous abnormality.
IMPRESSION: 1. No acute findings.  No active cardiopulmonary disease.
2. Unchanged moderate cardiomegaly.

## 2020-08-22 IMAGING — CT CT RENAL STONE PROTOCOL
2 of 4 series · 15 of 46 positions shown, 17 images · non-contrast
Comparison: 01/22/2019 CT abdomen/pelvis.

CLINICAL DATA: Left lower abdominal/flank pain, nausea, vomiting,
diarrhea. End-stage renal disease on hemodialysis. Anemia.

EXAM:
CT ABDOMEN AND PELVIS WITHOUT CONTRAST
TECHNIQUE: Multidetector CT imaging of the abdomen and pelvis was performed
following the standard protocol without IV contrast.

[Series 3: renal stone 5.0 · axial · 0.79mm/px · z∈[+897,+1317]mm · 12 of 94 slices shown, 14 images]
[im 5/94  soft-tissue]
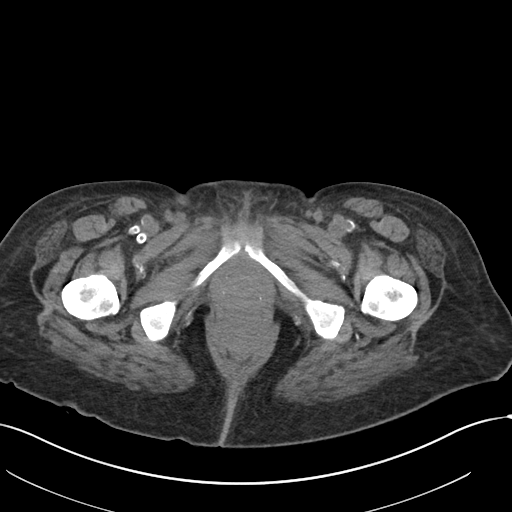
[im 5/94  bone]
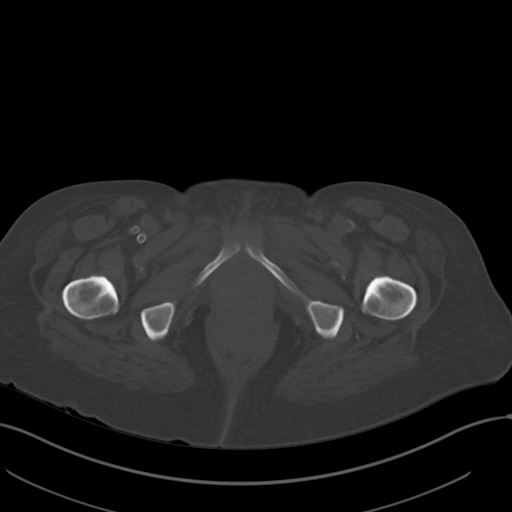
[im 13/94  soft-tissue]
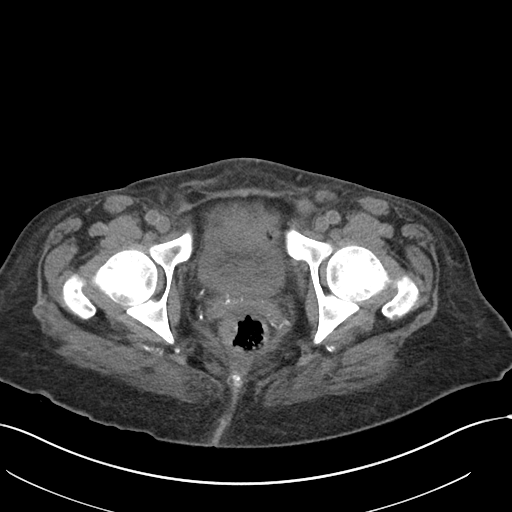
[im 21/94  soft-tissue]
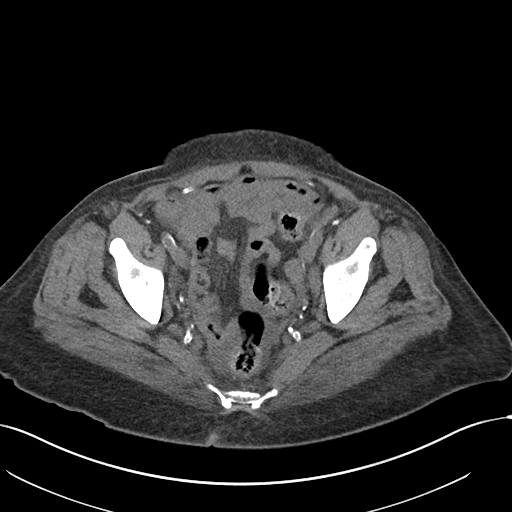
[im 29/94  soft-tissue]
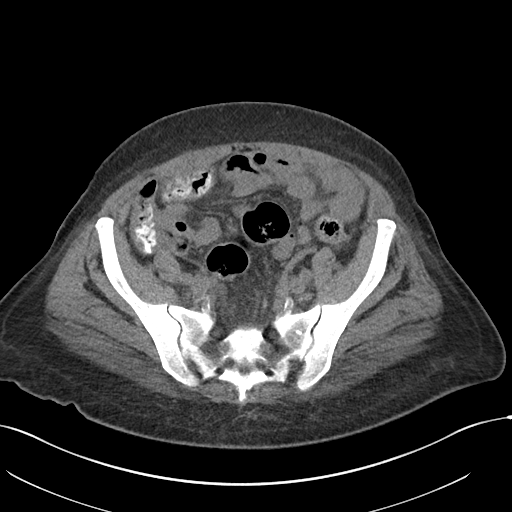
[im 37/94  soft-tissue]
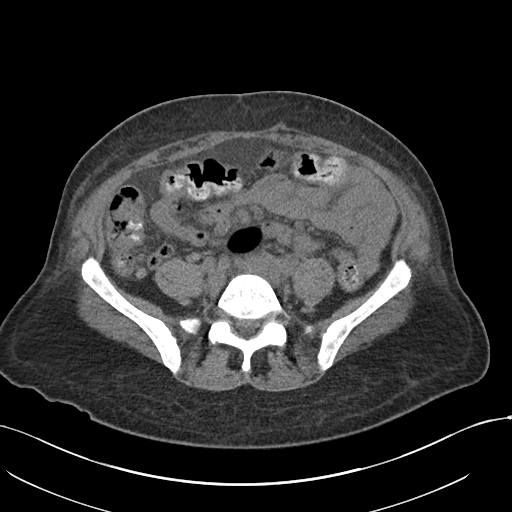
[im 45/94  soft-tissue]
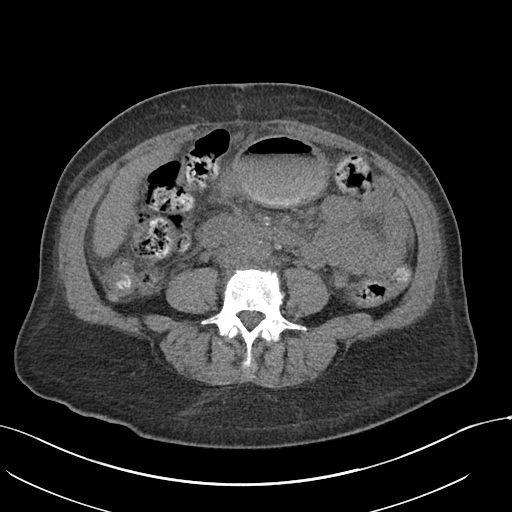
[im 49/94  soft-tissue]
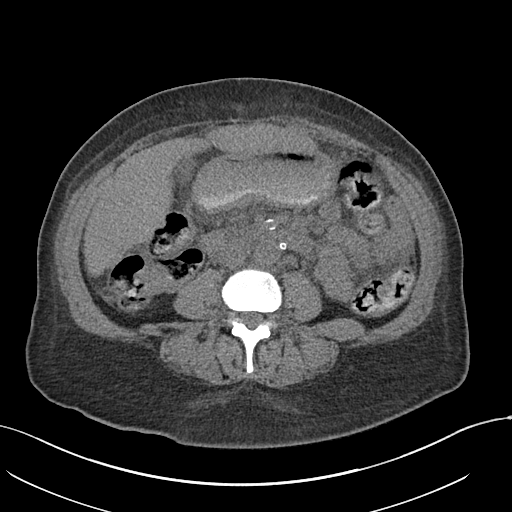
[im 57/94  soft-tissue]
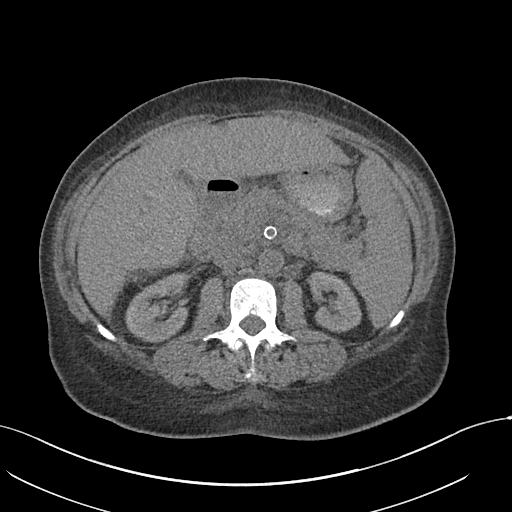
[im 65/94  soft-tissue]
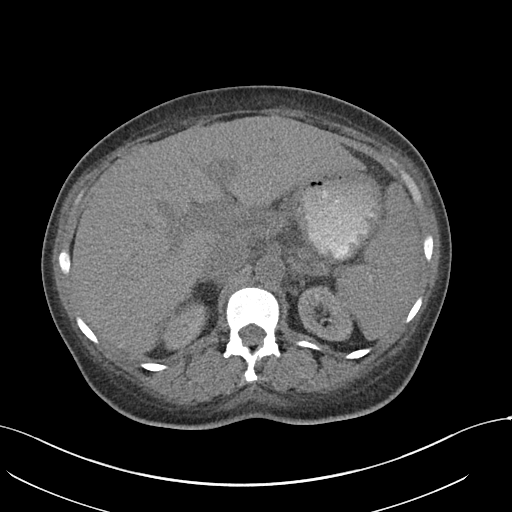
[im 65/94  bone]
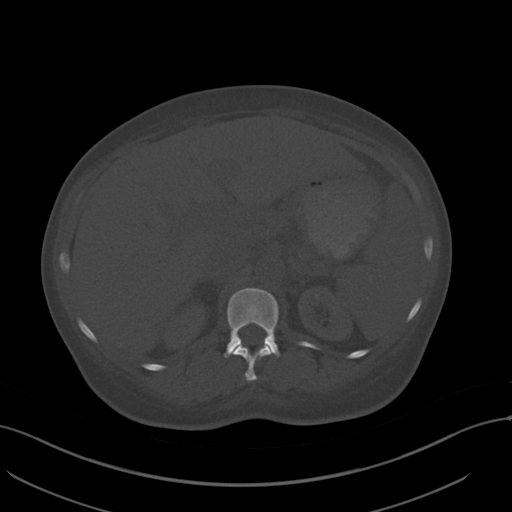
[im 73/94  soft-tissue]
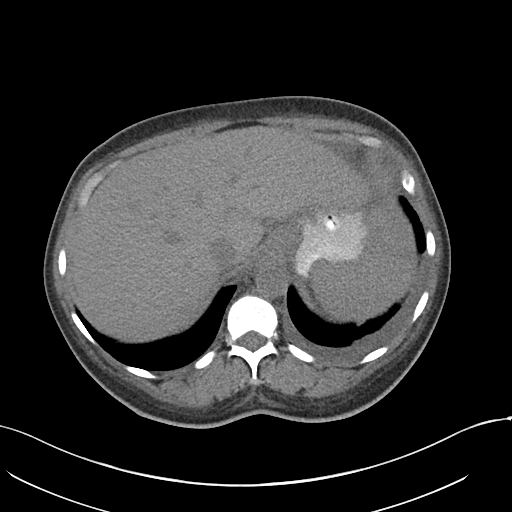
[im 81/94  soft-tissue]
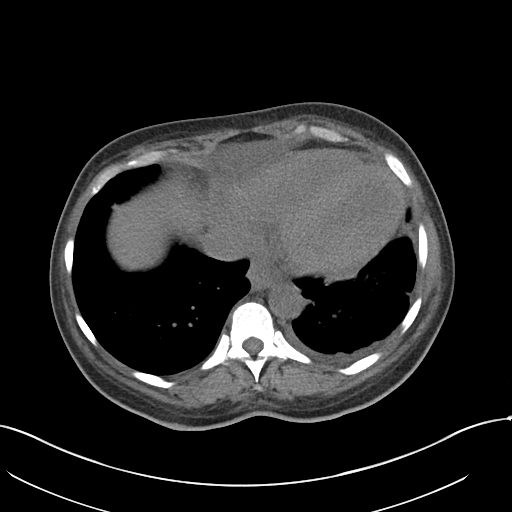
[im 89/94  soft-tissue]
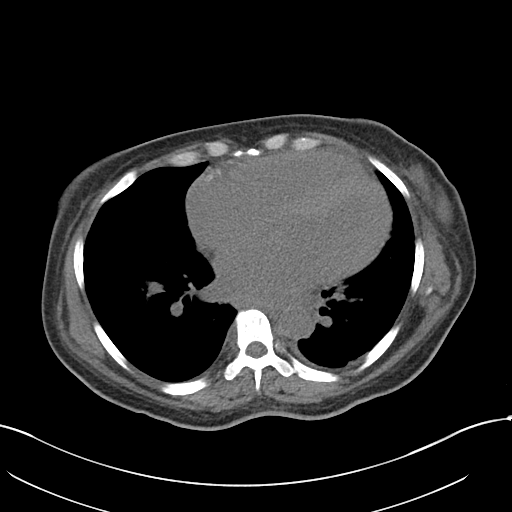

[Series 6: renal stone 3.0 cor · coronal · 0.79mm/px · 3 of 101 slices shown]
[im 34/101  soft-tissue]
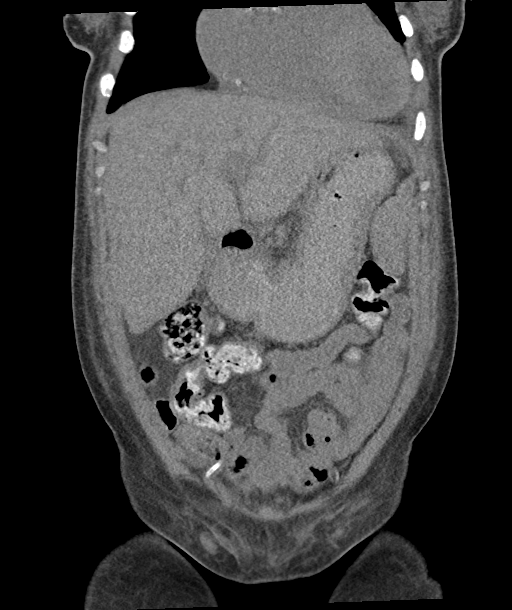
[im 45/101  soft-tissue]
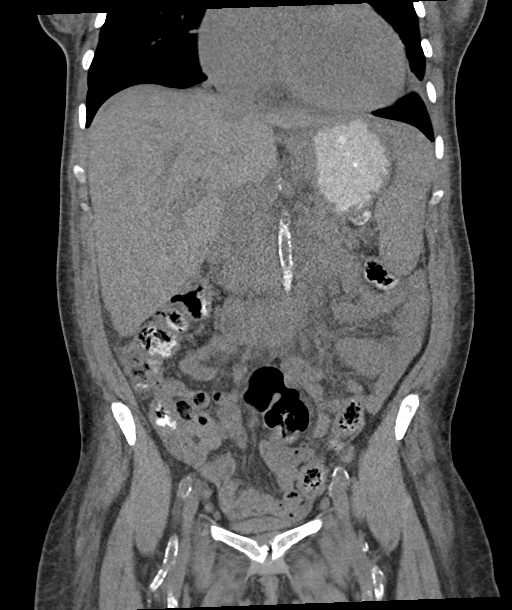
[im 56/101  soft-tissue]
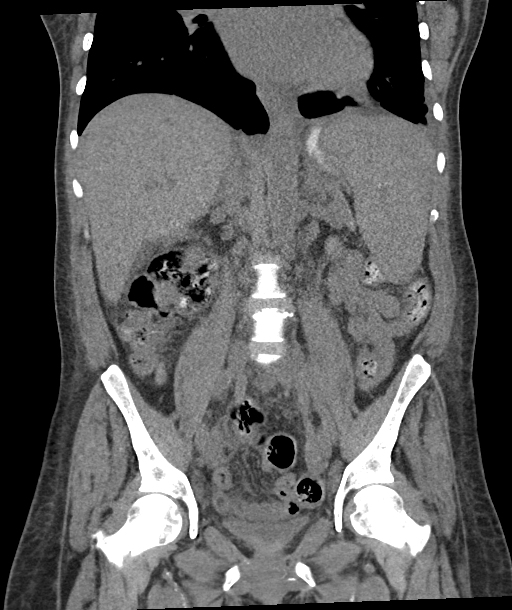

[15 of 46 positions shown; findings below may reference images not displayed]

FINDINGS: Lower chest: Small dependent left pleural effusion. Cardiomegaly.
Low cardiac blood pool density. Coronary atherosclerosis.

Hepatobiliary: Diffusely finely irregular liver surface compatible
with hepatic cirrhosis. No liver masses. Cholelithiasis. Contracted
gallbladder. No gallbladder wall thickening. No biliary ductal
dilatation.

Pancreas: Diffuse mild peripancreatic fat stranding. No pancreatic
duct dilation or discrete mass.

Spleen: Mild splenomegaly (craniocaudal splenic length 13.0 cm),
slightly increased. No splenic mass.

Adrenals/Urinary Tract: Normal adrenals. A 2 mm calcification in the
interpolar right kidney could represent vascular calcification or
nonobstructing stone. No hydronephrosis. No contour deforming renal
masses. Collapsed and normal bladder.

Stomach/Bowel: Normal non-distended stomach. Normal caliber small
bowel with no small bowel wall thickening. Normal appendix. Oral
contrast transits to the distal large bowel. No definite large bowel
wall thickening or significant diverticulosis.

Vascular/Lymphatic: Atherosclerotic nonaneurysmal abdominal aorta.
Mild bilateral external iliac lymphadenopathy measuring up to 1.1 cm
on the left (series 3/image 75), new since 01/22/2027. No
pathologically enlarged abdominal nodes.

Reproductive: Status post hysterectomy, with no abnormal findings at
the vaginal cuff. No adnexal mass.

Other: No pneumoperitoneum. Trace pelvic ascites. No focal fluid
collection. Mild anasarca.

Musculoskeletal: No aggressive appearing focal osseous lesions.
Faint sclerosis throughout the skeleton compatible with renal
osteodystrophy.
IMPRESSION: 1. Diffuse mild peripancreatic fat stranding, cannot exclude acute
pancreatitis.
2. Cholelithiasis. No evidence of acute cholecystitis. No biliary or
pancreatic duct dilation.
3. Cirrhosis.  Mild splenomegaly.
4. Small dependent left pleural effusion. Trace pelvic ascites. Mild
anasarca.
5. No evidence of bowel obstruction or acute bowel inflammation.
6. Nonspecific mild bilateral external iliac lymphadenopathy, new
since 01/22/2019 CT. Suggest attention on follow-up CT
abdomen/pelvis with oral and IV contrast in 3 months. This
recommendation follows ACR consensus guidelines: White Paper of the
ACR Incidental Findings Committee II on Splenic and Nodal Findings.
[HOSPITAL] 4285;[DATE].
7. Low cardiac blood pool density, compatible with anemia.
8.  Aortic Atherosclerosis ([id number]).

## 2020-10-20 IMAGING — CR DG CHEST 2V
2 series · 2 of 2 positions shown · non-contrast
Comparison: 04/01/2019

CLINICAL DATA: Chest pain, missed dialysis

EXAM:
CHEST - 2 VIEW

[chest lat]
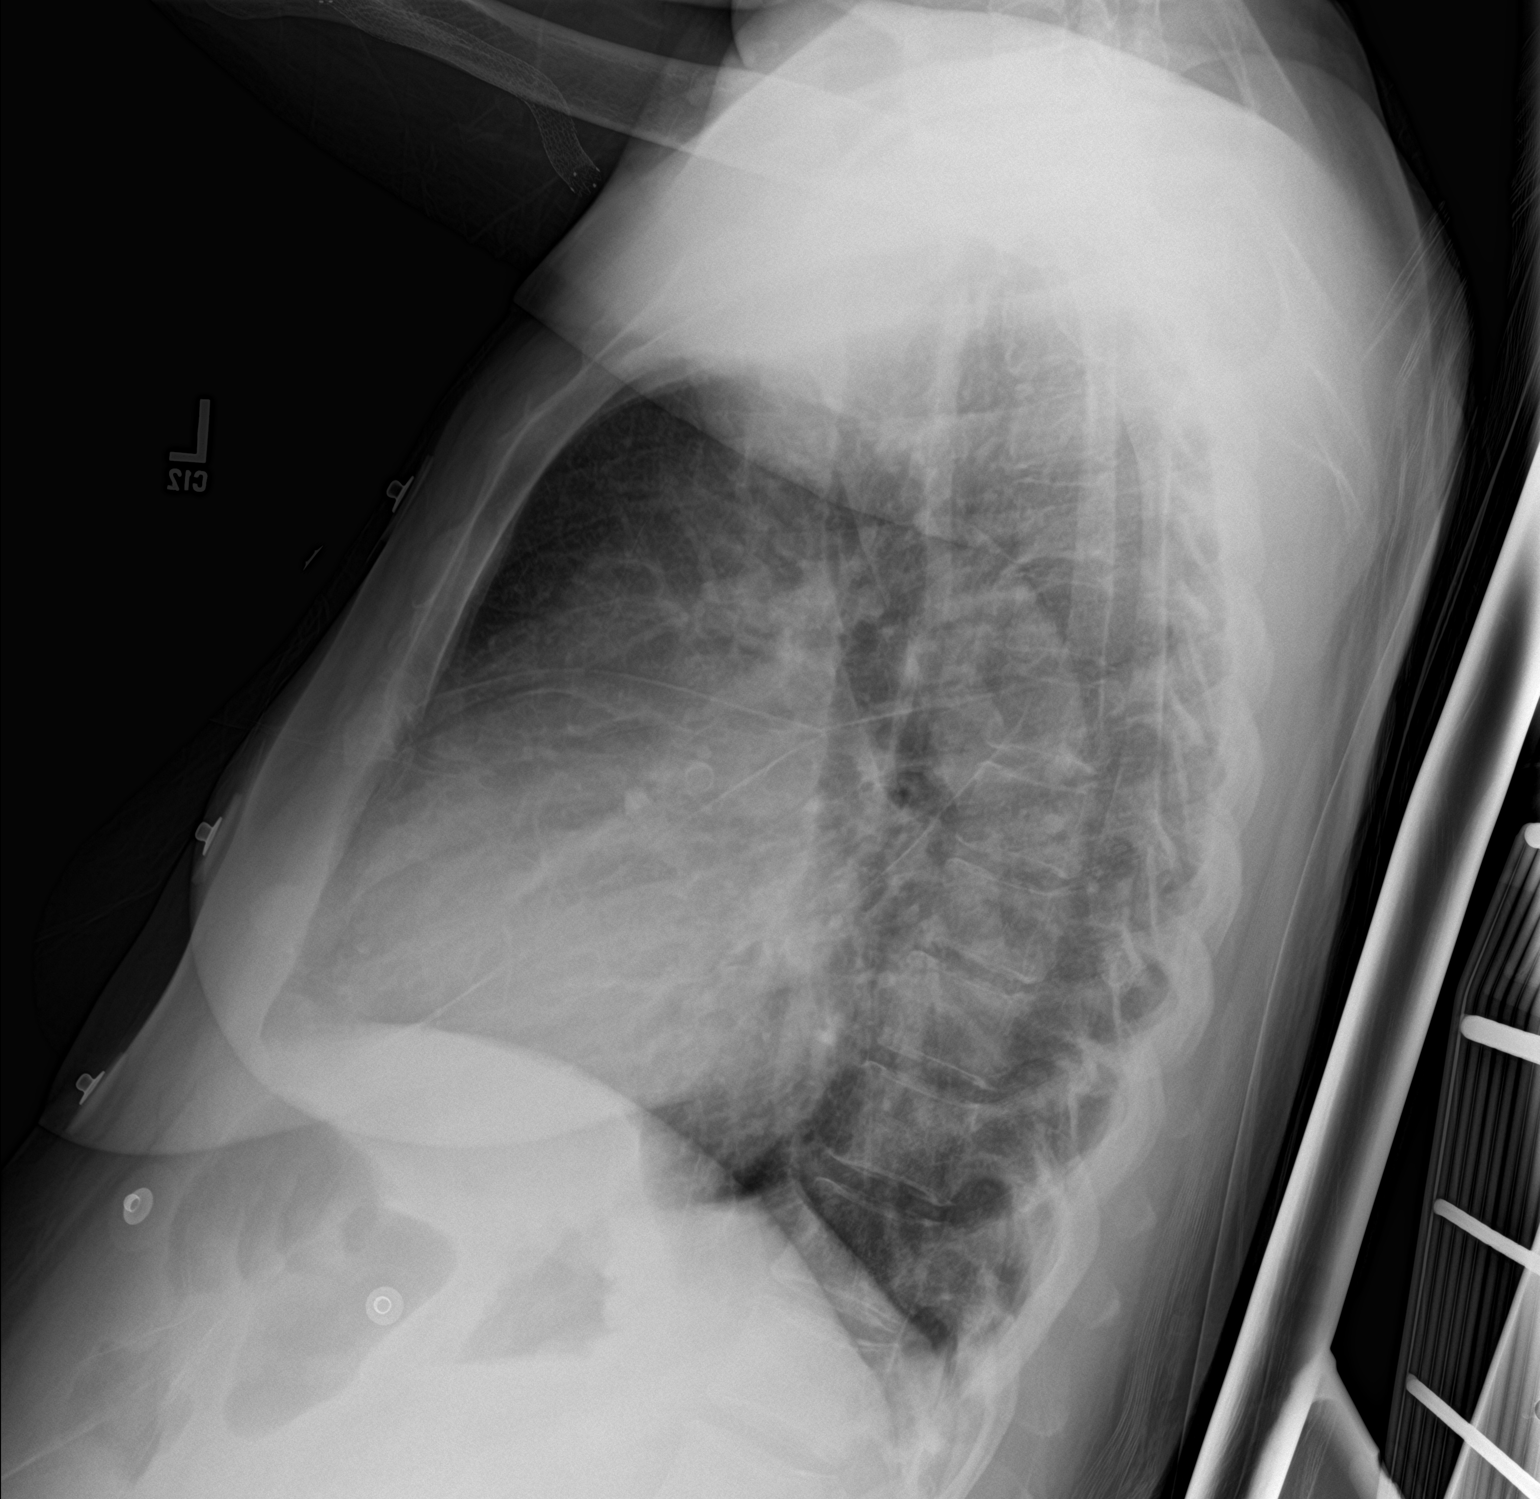

[chest ap]
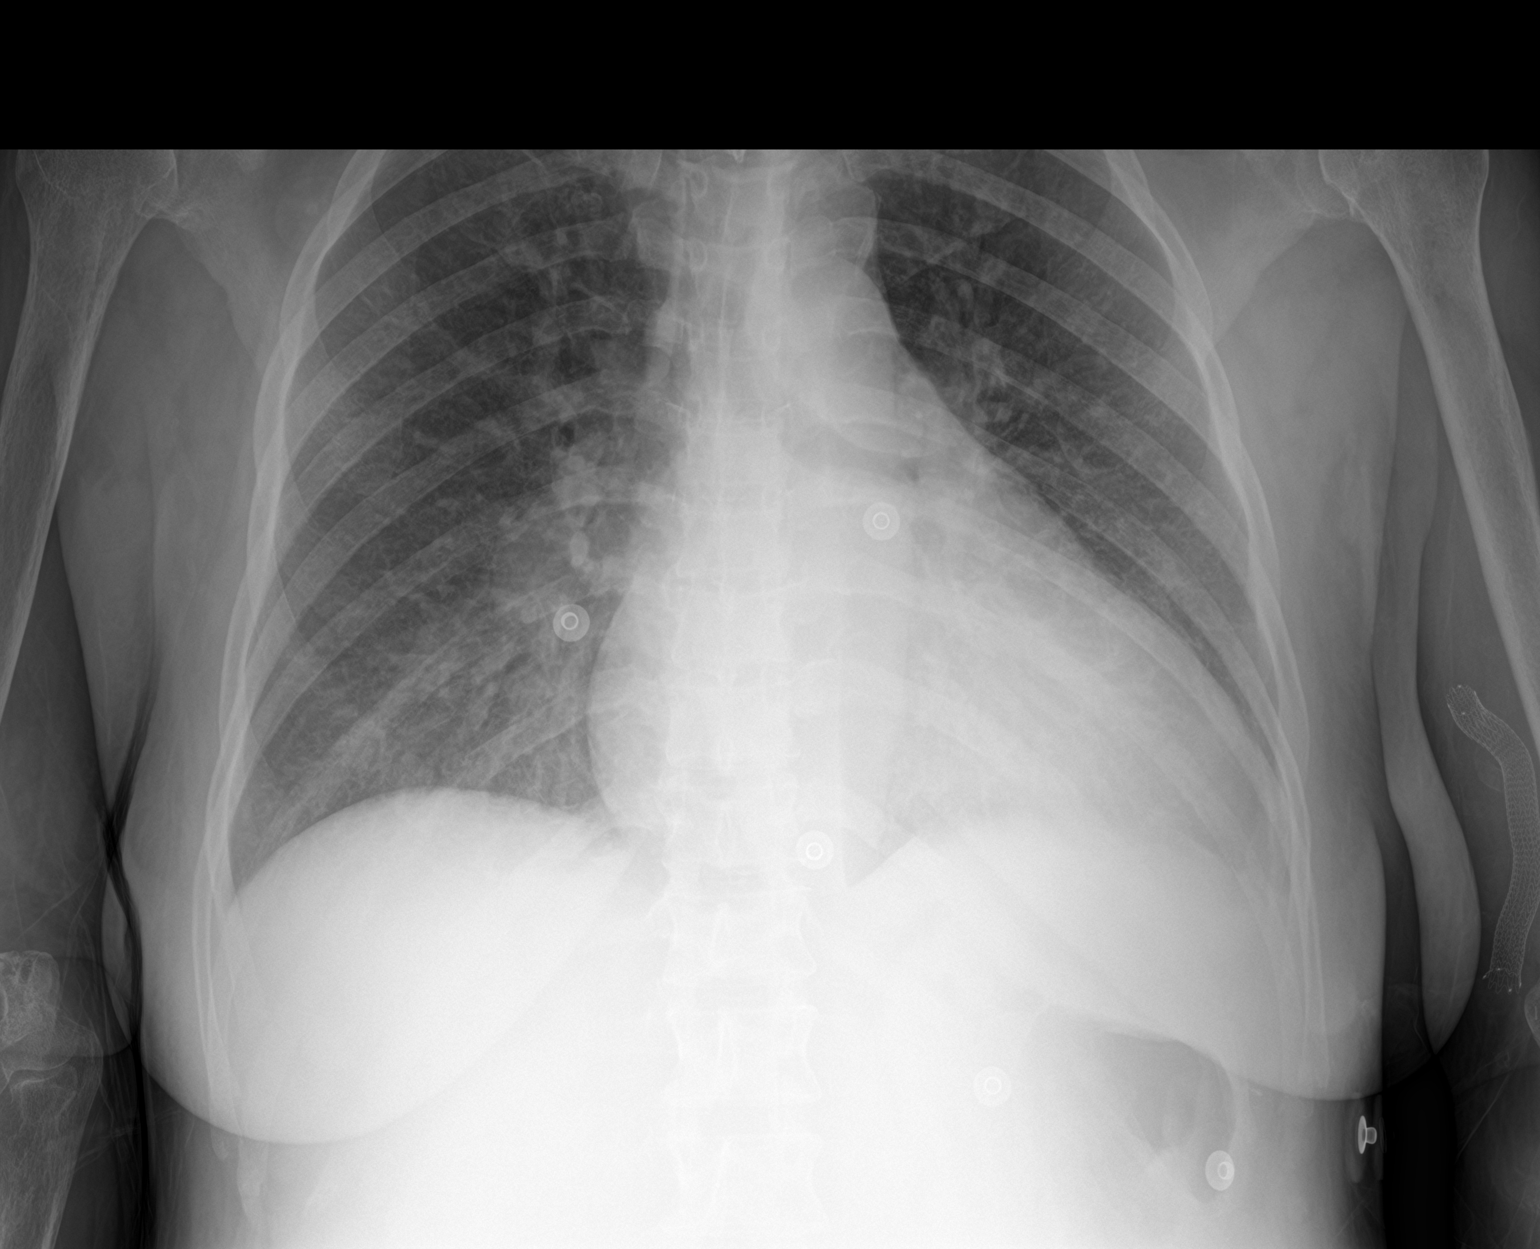

[2 of 2 positions shown; findings below may reference images not displayed]

FINDINGS: Cardiomegaly. Mild, diffuse interstitial opacity and minimal
fissural fluid. The visualized skeletal structures are unremarkable.
IMPRESSION: Mild, diffuse interstitial pulmonary opacity and minimal fissural
fluid, likely edema in the setting of cardiomegaly and dialysis. No
focal airspace opacity.

## 2020-10-21 IMAGING — XA IR EMBO ART  VEN HEMORR LYMPH EXTRAV  INC GUIDE ROADMAPPING
12 of 13 series · 13 of 24 positions shown · non-contrast
Comparison: none

INDICATION: End-stage renal disease, recurrent active lower GI bleeding, anemia
requiring transfusions

[Series 2: body 4 care · 9 acquisitions, 1 frame shown (1 of 9)]
[im 1/9]
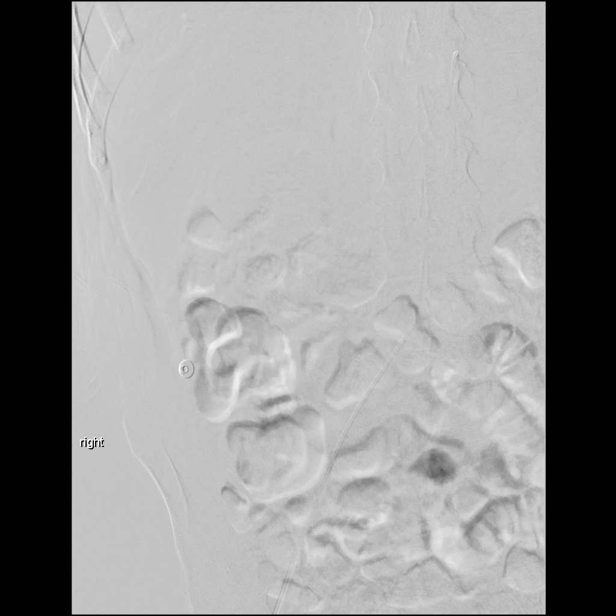

[Series 3: body 4 care · 16 acquisitions, 1 frame shown (2 of 9)]
[im 1/16]
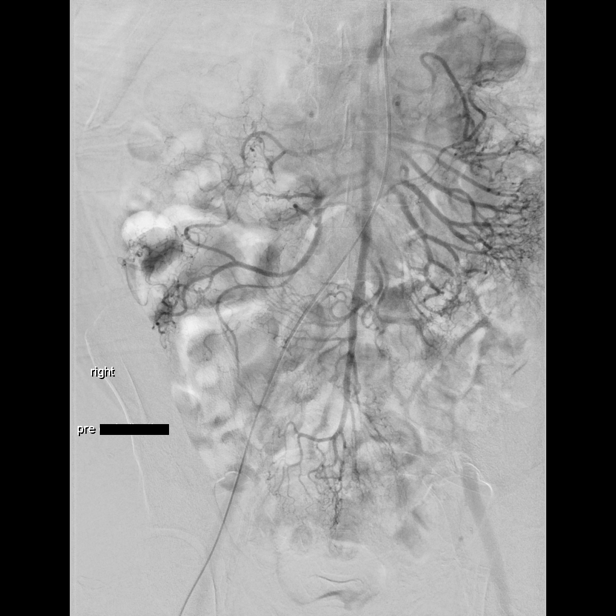

[Series 4: body 4 care · 14 acquisitions, 1 frame shown (3 of 9)]
[im 1/14]
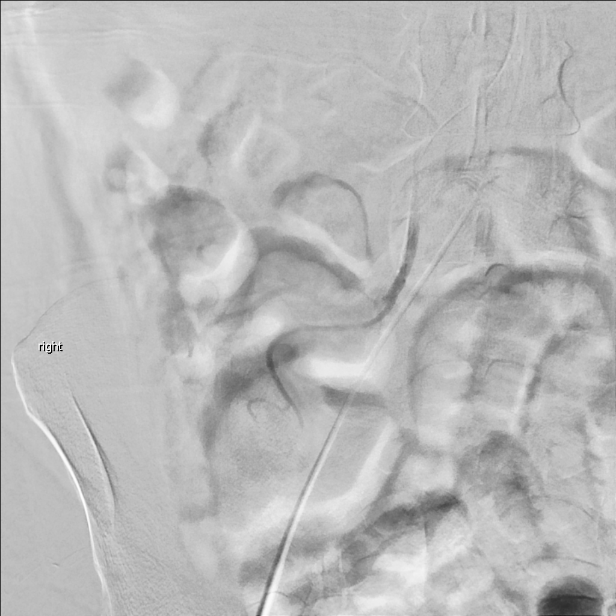

[Series 5: body 4 care · 11 acquisitions, 1 frame shown (4 of 9)]
[im 1/11]
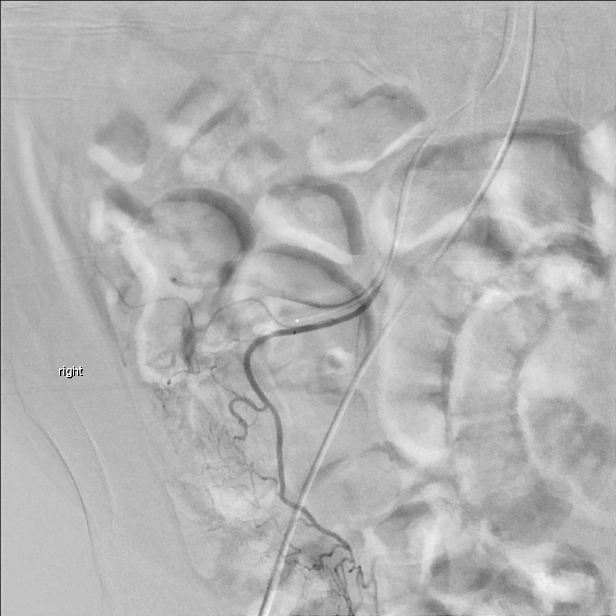

[Series 7: body 4 care · 17 acquisitions, 1 frame shown (5 of 9)]
[im 1/17]
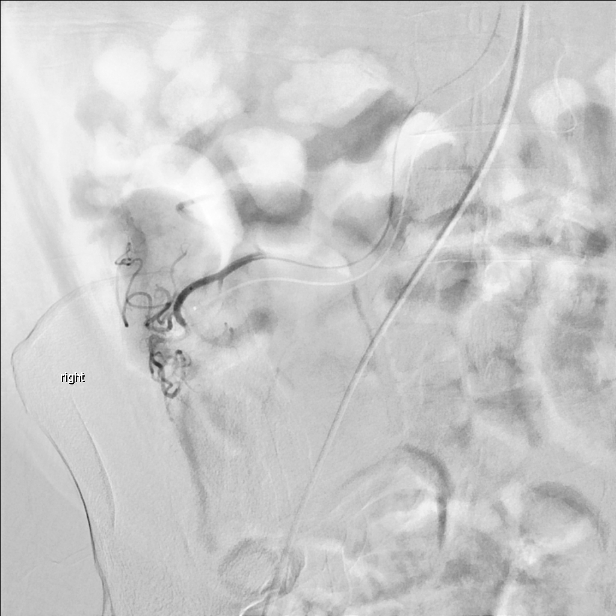

[Series 8: fl (-) angio · 1 of 3 slices shown (1 of 3)]
[im 1/3]
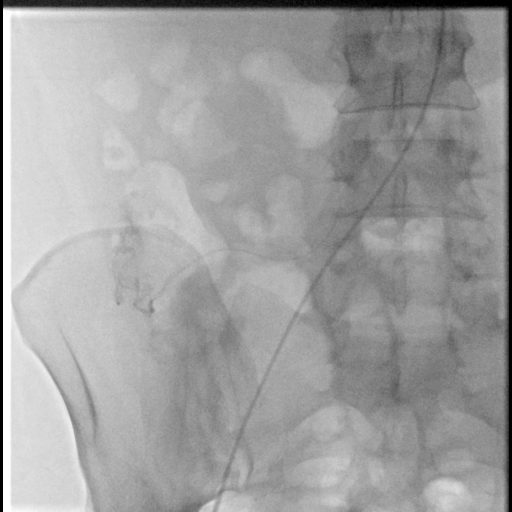

[Series 10: fl (-) angio · 1 of 2 slices shown (2 of 3)]
[im 1/2]
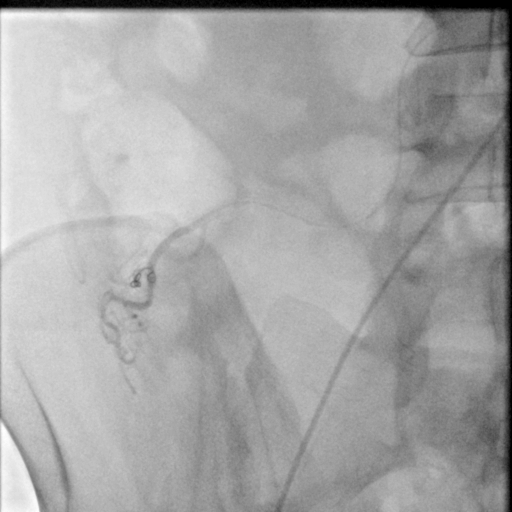

[Series 11: body 4 care · 15 acquisitions, 1 frame shown (6 of 9)]
[im 1/15]
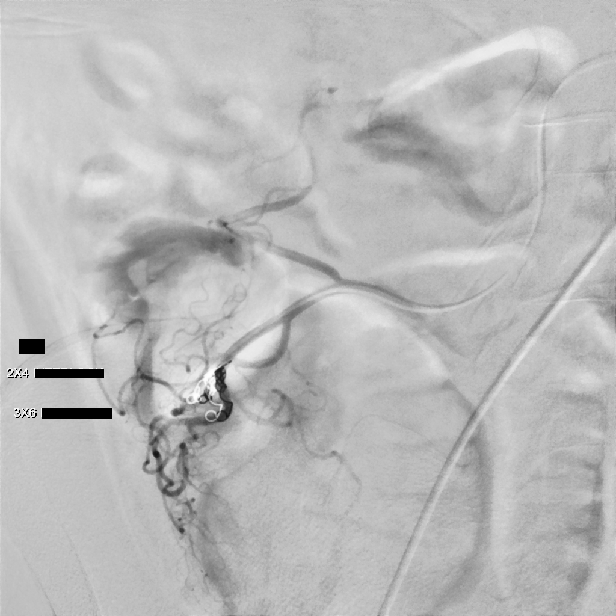

[Series 12: fl (-) angio · 1 of 1 slices shown (3 of 3)]
[im 1/1]
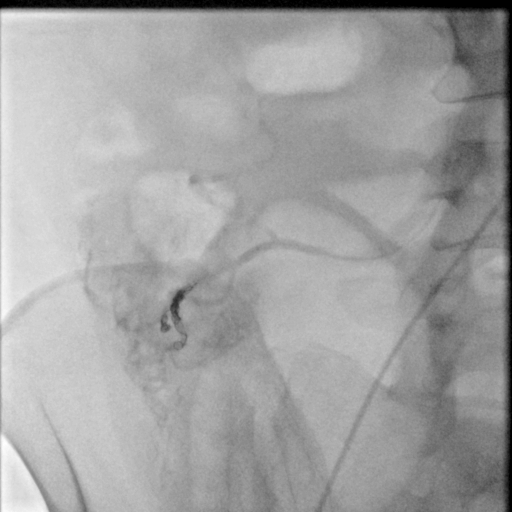

[Series 13: body 4 care · 18 acquisitions, 1 frame shown (7 of 9)]
[im 1/18]
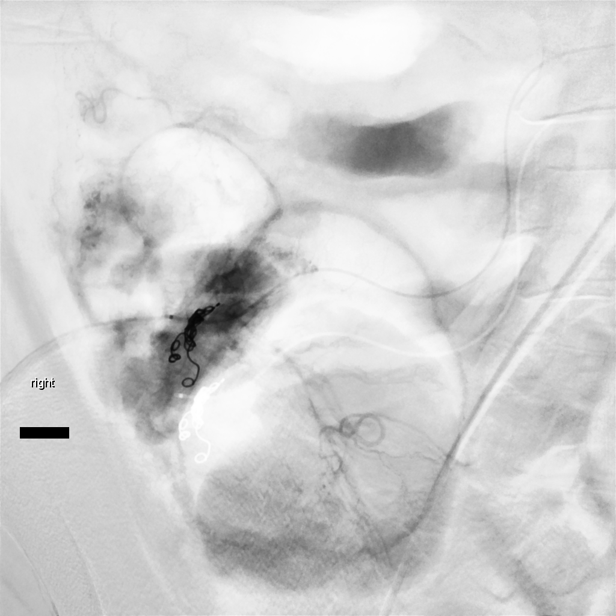

[Series 15: body 4 care · 16 acquisitions, 2 frames shown (8 of 9)]
[im 1/16]
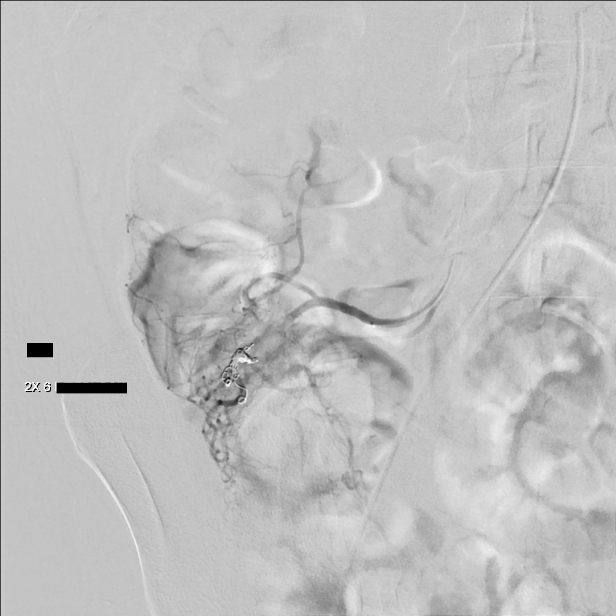
[im 16/16]
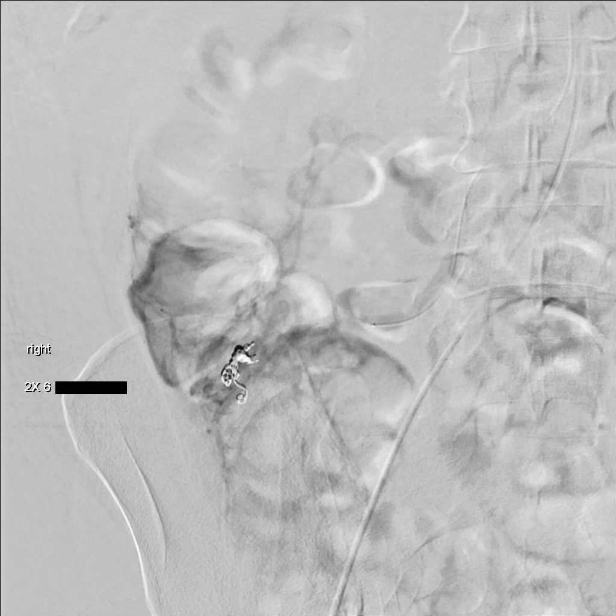

[Series 16: body 4 care · 1 of 9 slices shown (9 of 9)]
[im 9/9]
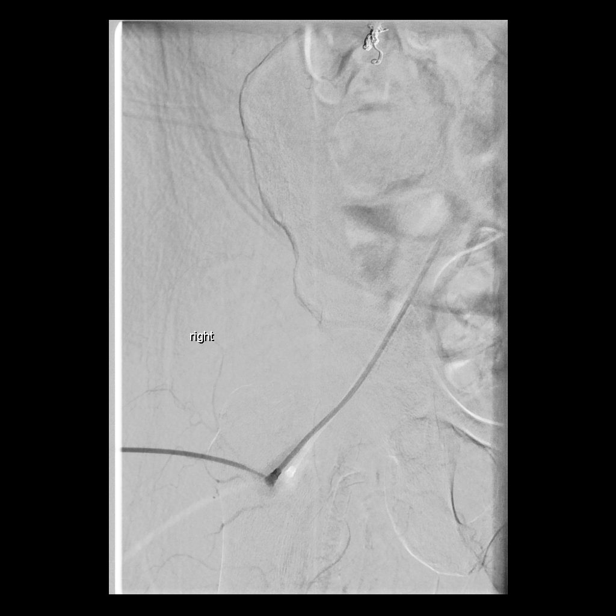

[13 of 24 positions shown; findings below may reference images not displayed]

EXAM:
Ultrasound guidance for vascular access

SMA, right colic, and 3 additional peripheral right colic branch
catheterizations and angiograms

Micro coil embolization of 2 peripheral right colic branches at the
site of right colonic angio dysplasia with intermittent bleeding.
(Site correlates with the nuclear medicine GI bleeding scan)

MEDICATIONS:
1% lidocaine local.

ANESTHESIA/SEDATION:
Moderate (conscious) sedation was employed during this procedure. A
total of Versed 2.0 mg and Fentanyl 100 mcg was administered
intravenously.

Moderate Sedation Time: 50 minutes. The patient's level of
consciousness and vital signs were monitored continuously by
radiology nursing throughout the procedure under my direct
supervision.

CONTRAST:  75 cc

FLUOROSCOPY TIME:  Fluoroscopy Time: 11 minutes 6 seconds (576 mGy).

COMPLICATIONS:
None immediate.



Under sterile conditions and local anesthesia, ultrasound
micropuncture access performed right common artery. Ultrasound
images obtained documentation of the patent right common femoral
artery. Five French sheath inserted over a guidewire. C2 catheter
advanced to the SMA over a Glidewire. SMA angiogram performed.

SMA: Atherosclerotic changes noted with wall calcification. SMA is
widely patent. The main SMA trunk including the jejunal and colic
branches are all patent. In the right lower quadrant, there are
hypertrophied tortuous peripheral right colic descending and
ascending branches with intermittent areas of mild bleeding/oozing
noted with a prominent enlarged draining vein in this region.
Appearance is compatible with right colonic angiodysplasia.

Through the C2 catheter, a Renegade STC catheter was advanced into
the right colic artery. This was performed over a GT Glidewire.
Right colic angiogram performed.

Right colic: The main right colic artery is patent. Peripheral right
colic ascending and descending branches are patent. Again, from 2 of
the ascending right colic branches there is tortuous
hypervascularity with intermittent oozing/bleeding noted and a
prominent early draining vein. This appears to be the dominant
vessels to the right colon angiodysplasia.

Renegade STC microcatheter was advanced into the right colic
descending branch.

Right colic descending branch: The descending branch appears patent
without significant supply to the area of angio dysplasia.
Descending branch vascular anatomy appears normal without
hypervascularity, tortuosity, bleeding or early venous drainage.

Microcatheter was retracted into the adjacent ascending right colic
branch. Peripheral angiogram performed.

Right colic ascending branch: There are 2 adjacent enlarged right
colic ascending branches with associated tortuous hypervascularity,
colonic wall staining, and early draining venous anatomy consistent
with angiodysplasia.

Right colic peripheral branch micro coil embolization: Two and 3 mm
interlock coils in length of 4 and 6 mm successfully deployed in 2
adjacent branches of a peripheral right colic ascending artery at
the site of angiodysplasia. Following micro coil embolization of
these 2 adjacent small branches, repeat injection demonstrates
significant reduction in arterial vascular flow throughout the
network of tortuous vessels related to angiodysplasia.

After approximately 5 minutes repeat injection of the peripheral
right colic artery again confirms reduction in arterial vascular
flow to the area hypervascularity. No further intermittent oozing or
bleeding appreciated.

At this point the procedure was stopped. Access removed. Hemostasis
obtained the right femoral puncture site with ExoSeal device
successfully. No immediate complication. Patient tolerated the
procedure well.
IMPRESSION: Angiographic findings compatible with right colon angiodysplasia
with intermittent bleeding. Successful micro coil embolization of 2
right colic peripheral branches at the site of dominant arterial
supply to the angiodysplasia.

## 2020-10-21 IMAGING — US IR ANGIO/ADD [PERSON_NAME]
3 series · 6 of 6 positions shown · non-contrast
Comparison: none

INDICATION: End-stage renal disease, recurrent active lower GI bleeding, anemia
requiring transfusions

[Series 1: ir angio/add (person_name) · 2 of 2 slices shown (1 of 3)]
[im 1/2]
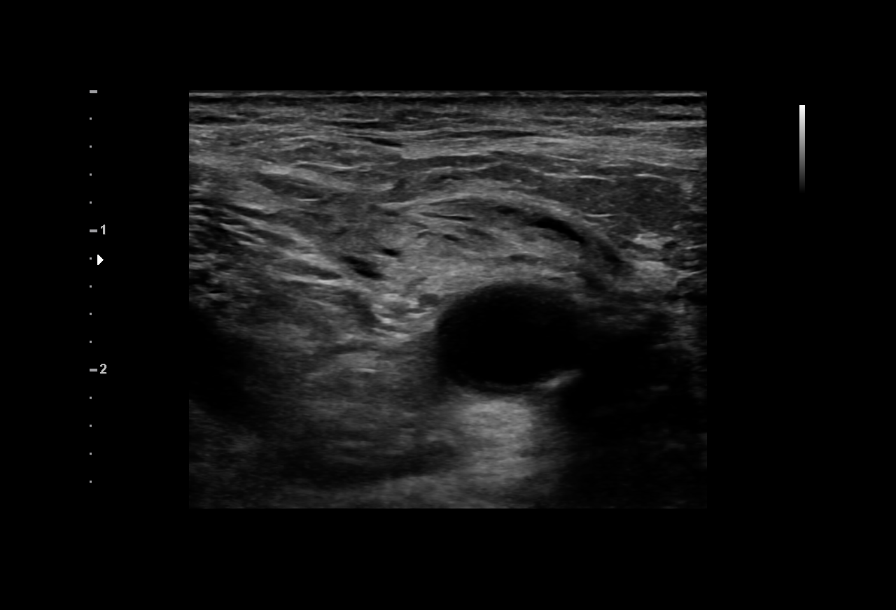
[im 2/2]
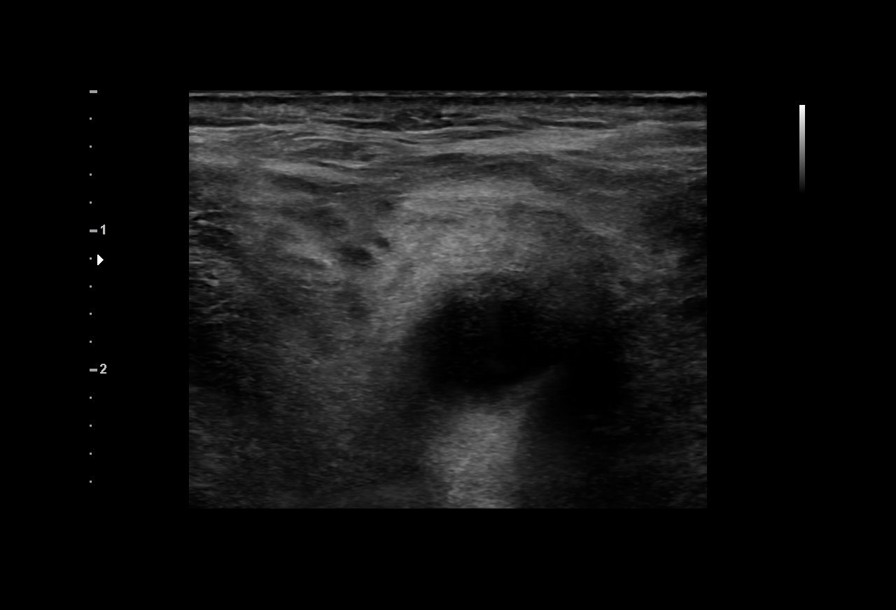

[Series 1: ir angio/add (person_name) · 2 of 2 slices shown (2 of 3)]
[im 1/2]
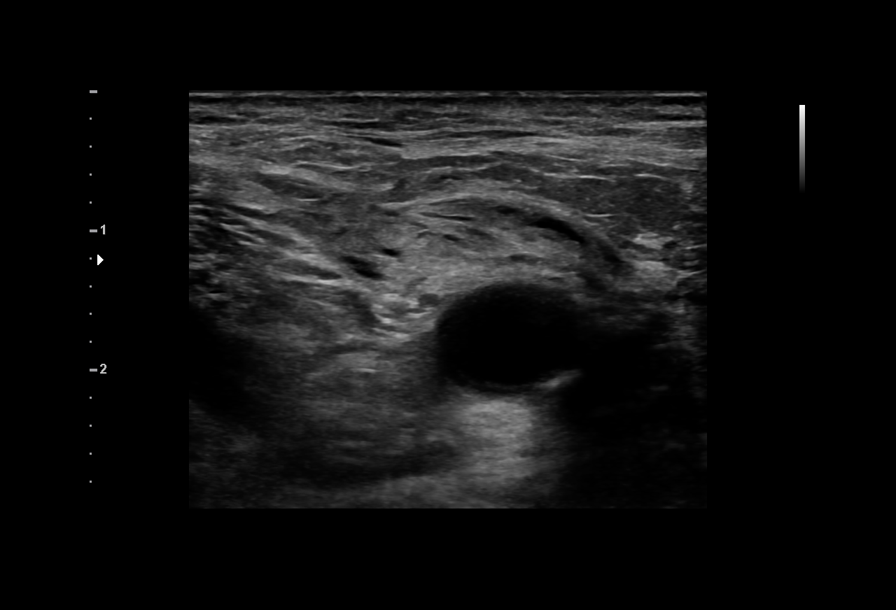
[im 2/2]
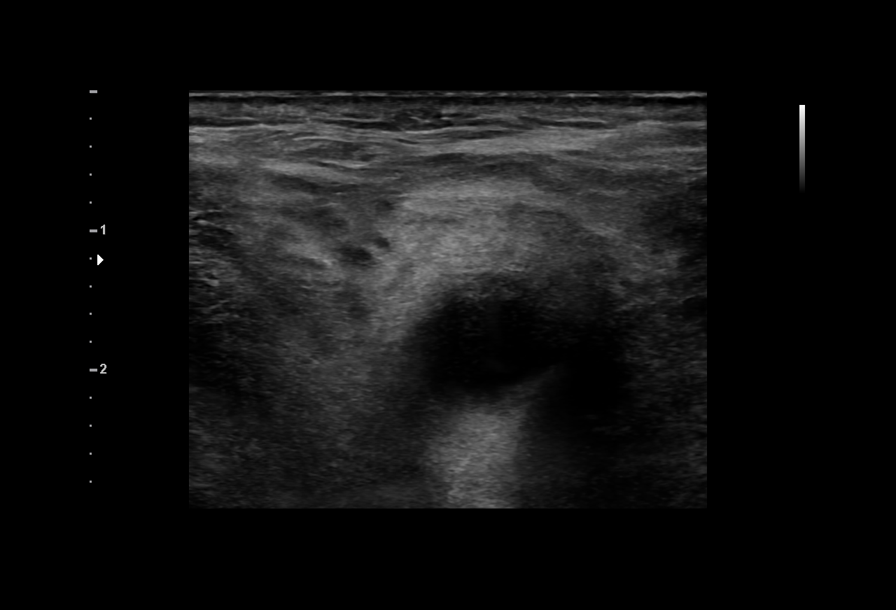

[Series 1: ir angio/add (person_name) · 2 of 2 slices shown (3 of 3)]
[im 1/2]
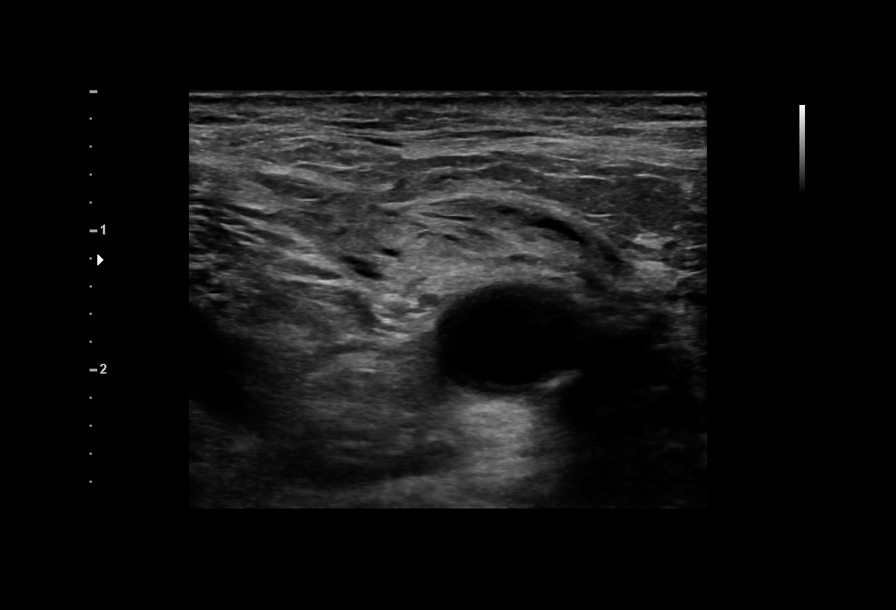
[im 2/2]
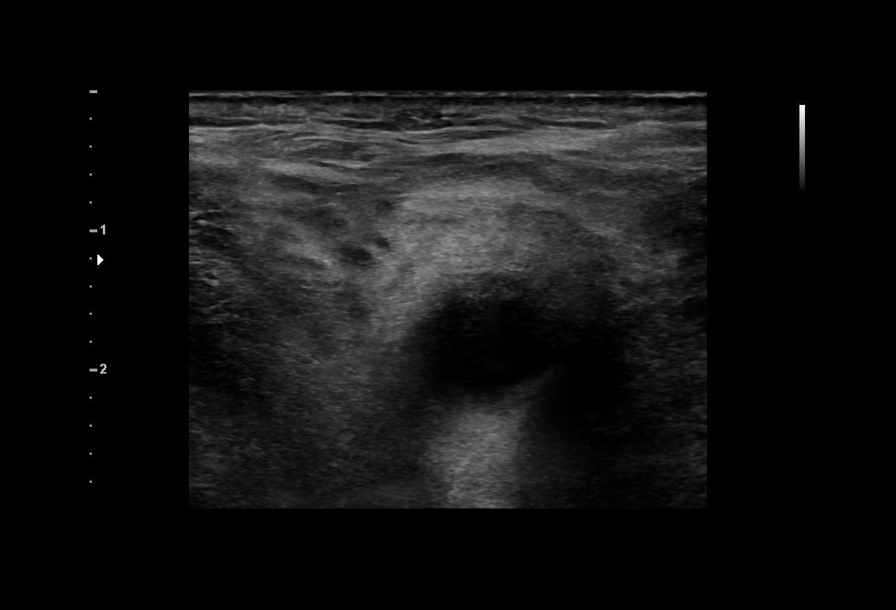

[6 of 6 positions shown; findings below may reference images not displayed]

EXAM:
Ultrasound guidance for vascular access

SMA, right colic, and 3 additional peripheral right colic branch
catheterizations and angiograms

Micro coil embolization of 2 peripheral right colic branches at the
site of right colonic angio dysplasia with intermittent bleeding.
(Site correlates with the nuclear medicine GI bleeding scan)

MEDICATIONS:
1% lidocaine local.

ANESTHESIA/SEDATION:
Moderate (conscious) sedation was employed during this procedure. A
total of Versed 2.0 mg and Fentanyl 100 mcg was administered
intravenously.

Moderate Sedation Time: 50 minutes. The patient's level of
consciousness and vital signs were monitored continuously by
radiology nursing throughout the procedure under my direct
supervision.

CONTRAST:  75 cc

FLUOROSCOPY TIME:  Fluoroscopy Time: 11 minutes 6 seconds (576 mGy).

COMPLICATIONS:
None immediate.



Under sterile conditions and local anesthesia, ultrasound
micropuncture access performed right common artery. Ultrasound
images obtained documentation of the patent right common femoral
artery. Five French sheath inserted over a guidewire. C2 catheter
advanced to the SMA over a Glidewire. SMA angiogram performed.

SMA: Atherosclerotic changes noted with wall calcification. SMA is
widely patent. The main SMA trunk including the jejunal and colic
branches are all patent. In the right lower quadrant, there are
hypertrophied tortuous peripheral right colic descending and
ascending branches with intermittent areas of mild bleeding/oozing
noted with a prominent enlarged draining vein in this region.
Appearance is compatible with right colonic angiodysplasia.

Through the C2 catheter, a Renegade STC catheter was advanced into
the right colic artery. This was performed over a GT Glidewire.
Right colic angiogram performed.

Right colic: The main right colic artery is patent. Peripheral right
colic ascending and descending branches are patent. Again, from 2 of
the ascending right colic branches there is tortuous
hypervascularity with intermittent oozing/bleeding noted and a
prominent early draining vein. This appears to be the dominant
vessels to the right colon angiodysplasia.

Renegade STC microcatheter was advanced into the right colic
descending branch.

Right colic descending branch: The descending branch appears patent
without significant supply to the area of angio dysplasia.
Descending branch vascular anatomy appears normal without
hypervascularity, tortuosity, bleeding or early venous drainage.

Microcatheter was retracted into the adjacent ascending right colic
branch. Peripheral angiogram performed.

Right colic ascending branch: There are 2 adjacent enlarged right
colic ascending branches with associated tortuous hypervascularity,
colonic wall staining, and early draining venous anatomy consistent
with angiodysplasia.

Right colic peripheral branch micro coil embolization: Two and 3 mm
interlock coils in length of 4 and 6 mm successfully deployed in 2
adjacent branches of a peripheral right colic ascending artery at
the site of angiodysplasia. Following micro coil embolization of
these 2 adjacent small branches, repeat injection demonstrates
significant reduction in arterial vascular flow throughout the
network of tortuous vessels related to angiodysplasia.

After approximately 5 minutes repeat injection of the peripheral
right colic artery again confirms reduction in arterial vascular
flow to the area hypervascularity. No further intermittent oozing or
bleeding appreciated.

At this point the procedure was stopped. Access removed. Hemostasis
obtained the right femoral puncture site with ExoSeal device
successfully. No immediate complication. Patient tolerated the
procedure well.
IMPRESSION: Angiographic findings compatible with right colon angiodysplasia
with intermittent bleeding. Successful micro coil embolization of 2
right colic peripheral branches at the site of dominant arterial
supply to the angiodysplasia.

## 2020-10-21 IMAGING — NM NM GI BLOOD LOSS
2 series · 12 of 12 positions shown · non-contrast
Comparison: CT 05/14/2019

CLINICAL DATA: Concern for gastrointestinal bleeding. Dark stool,
nausea and vomiting. Melena

EXAM:
NUCLEAR MEDICINE GASTROINTESTINAL BLEEDING SCAN
TECHNIQUE: Sequential abdominal images were obtained following intravenous
administration of 7c-55m labeled red blood cells.
RADIOPHARMACEUTICALS:  Twenty mCi 7c-55m pertechnetate in-vitro
labeled red cells.

[gi gi bleed · 4.52mm/px · 6 of 60 frames shown (1 of 2)]
[frame 6/60]
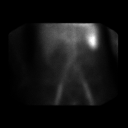
[frame 16/60]
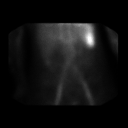
[frame 26/60]
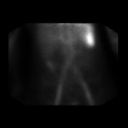
[frame 36/60]
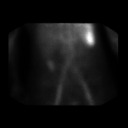
[frame 46/60]
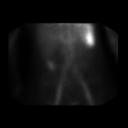
[frame 56/60]
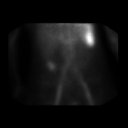

[gi gi bleed · 4.52mm/px · 6 of 60 frames shown (2 of 2)]
[frame 6/60]
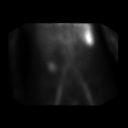
[frame 16/60]
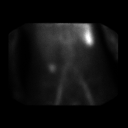
[frame 26/60]
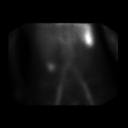
[frame 36/60]
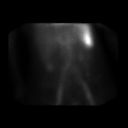
[frame 46/60]
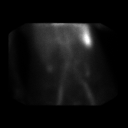
[frame 56/60]
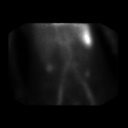

[12 of 12 positions shown; findings below may reference images not displayed]

FINDINGS: During the first 60 minutes of imaging, focus of radiotracer
accumulation develops in the RIGHT mid abdomen in relatively lateral
position. Over the second hour imaging, radiotracer is progresses in
tubular pattern in the central abdomen and LEFT lateral abdomen.
Findings are most consistent with active gastrointestinal bleeding
beginning in the hepatic flexure of the colon and blood advancing to
the descending colon.
IMPRESSION: Active gastrointestinal bleeding originating in the RIGHT colon.

These results will be called to the ordering clinician or
representative by the Radiologist Assistant, and communication
documented in the PACS or zVision Dashboard.

## 2020-10-21 IMAGING — US IR ANGIO/VISCERAL SELECTIVE EA VESSEL WO/W FLUSH
1 series · 2 of 2 positions shown · non-contrast
Comparison: none

INDICATION: End-stage renal disease, recurrent active lower GI bleeding, anemia
requiring transfusions

[Series 1: ir angio/visceral selective ea vessel wo/w flush · 2 of 2 slices shown]
[im 1/2]
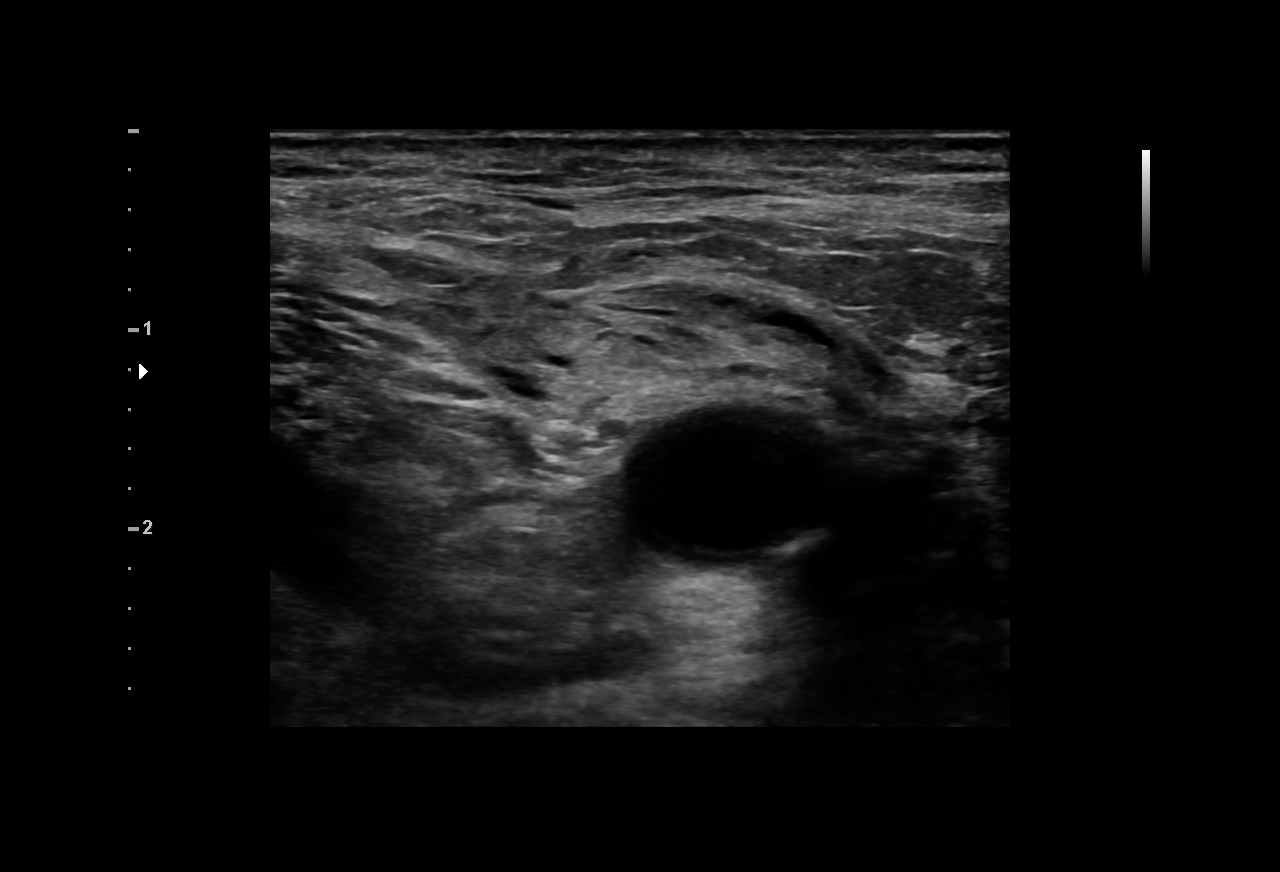
[im 2/2]
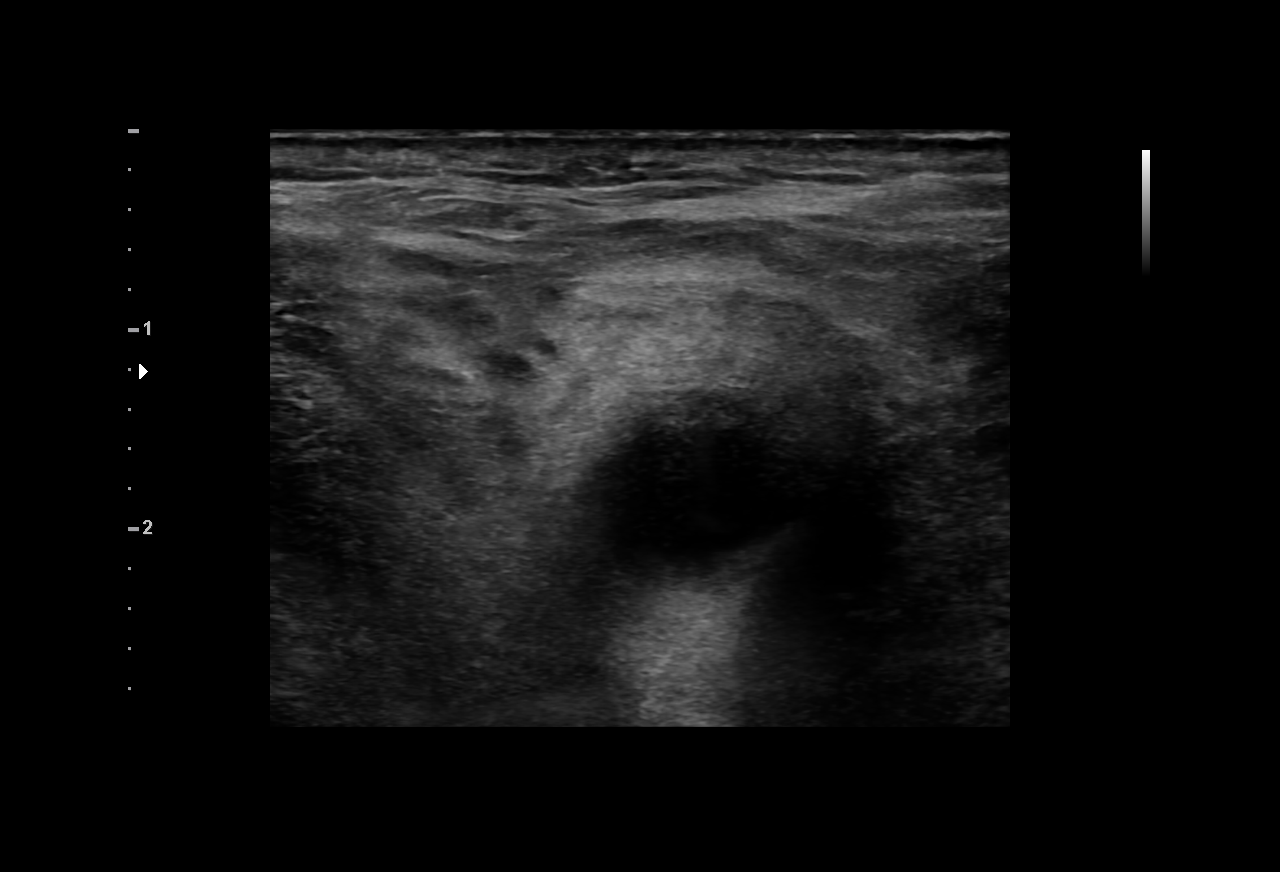

[2 of 2 positions shown; findings below may reference images not displayed]

EXAM:
Ultrasound guidance for vascular access

SMA, right colic, and 3 additional peripheral right colic branch
catheterizations and angiograms

Micro coil embolization of 2 peripheral right colic branches at the
site of right colonic angio dysplasia with intermittent bleeding.
(Site correlates with the nuclear medicine GI bleeding scan)

MEDICATIONS:
1% lidocaine local.

ANESTHESIA/SEDATION:
Moderate (conscious) sedation was employed during this procedure. A
total of Versed 2.0 mg and Fentanyl 100 mcg was administered
intravenously.

Moderate Sedation Time: 50 minutes. The patient's level of
consciousness and vital signs were monitored continuously by
radiology nursing throughout the procedure under my direct
supervision.

CONTRAST:  75 cc

FLUOROSCOPY TIME:  Fluoroscopy Time: 11 minutes 6 seconds (576 mGy).

COMPLICATIONS:
None immediate.



Under sterile conditions and local anesthesia, ultrasound
micropuncture access performed right common artery. Ultrasound
images obtained documentation of the patent right common femoral
artery. Five French sheath inserted over a guidewire. C2 catheter
advanced to the SMA over a Glidewire. SMA angiogram performed.

SMA: Atherosclerotic changes noted with wall calcification. SMA is
widely patent. The main SMA trunk including the jejunal and colic
branches are all patent. In the right lower quadrant, there are
hypertrophied tortuous peripheral right colic descending and
ascending branches with intermittent areas of mild bleeding/oozing
noted with a prominent enlarged draining vein in this region.
Appearance is compatible with right colonic angiodysplasia.

Through the C2 catheter, a Renegade STC catheter was advanced into
the right colic artery. This was performed over a GT Glidewire.
Right colic angiogram performed.

Right colic: The main right colic artery is patent. Peripheral right
colic ascending and descending branches are patent. Again, from 2 of
the ascending right colic branches there is tortuous
hypervascularity with intermittent oozing/bleeding noted and a
prominent early draining vein. This appears to be the dominant
vessels to the right colon angiodysplasia.

Renegade STC microcatheter was advanced into the right colic
descending branch.

Right colic descending branch: The descending branch appears patent
without significant supply to the area of angio dysplasia.
Descending branch vascular anatomy appears normal without
hypervascularity, tortuosity, bleeding or early venous drainage.

Microcatheter was retracted into the adjacent ascending right colic
branch. Peripheral angiogram performed.

Right colic ascending branch: There are 2 adjacent enlarged right
colic ascending branches with associated tortuous hypervascularity,
colonic wall staining, and early draining venous anatomy consistent
with angiodysplasia.

Right colic peripheral branch micro coil embolization: Two and 3 mm
interlock coils in length of 4 and 6 mm successfully deployed in 2
adjacent branches of a peripheral right colic ascending artery at
the site of angiodysplasia. Following micro coil embolization of
these 2 adjacent small branches, repeat injection demonstrates
significant reduction in arterial vascular flow throughout the
network of tortuous vessels related to angiodysplasia.

After approximately 5 minutes repeat injection of the peripheral
right colic artery again confirms reduction in arterial vascular
flow to the area hypervascularity. No further intermittent oozing or
bleeding appreciated.

At this point the procedure was stopped. Access removed. Hemostasis
obtained the right femoral puncture site with ExoSeal device
successfully. No immediate complication. Patient tolerated the
procedure well.
IMPRESSION: Angiographic findings compatible with right colon angiodysplasia
with intermittent bleeding. Successful micro coil embolization of 2
right colic peripheral branches at the site of dominant arterial
supply to the angiodysplasia.

## 2020-10-21 IMAGING — XA IR ANGIO/ADD [PERSON_NAME]
8 series · 12 of 24 positions shown · IV contrast (IODINE)
Comparison: none

INDICATION: End-stage renal disease, recurrent active lower GI bleeding, anemia
requiring transfusions

[Series 5: body 4 care · 11 acquisitions, 1 frame shown (1 of 6)]
[im 1/11]
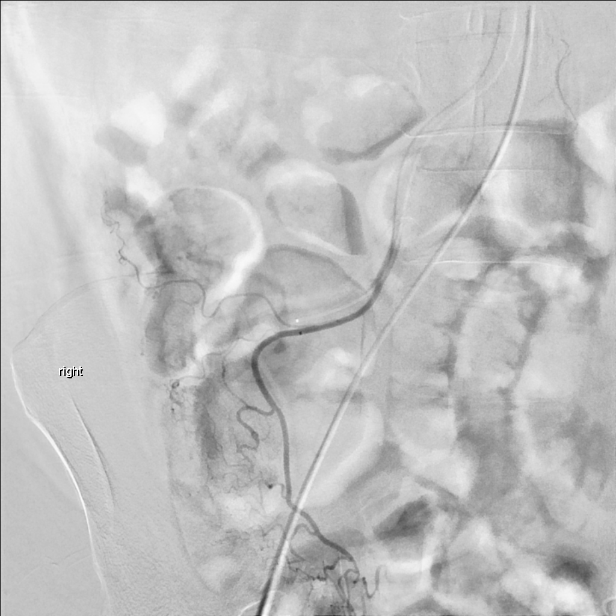

[Series 7: body 4 care · 17 acquisitions, 2 frames shown (2 of 6)]
[im 1/17]
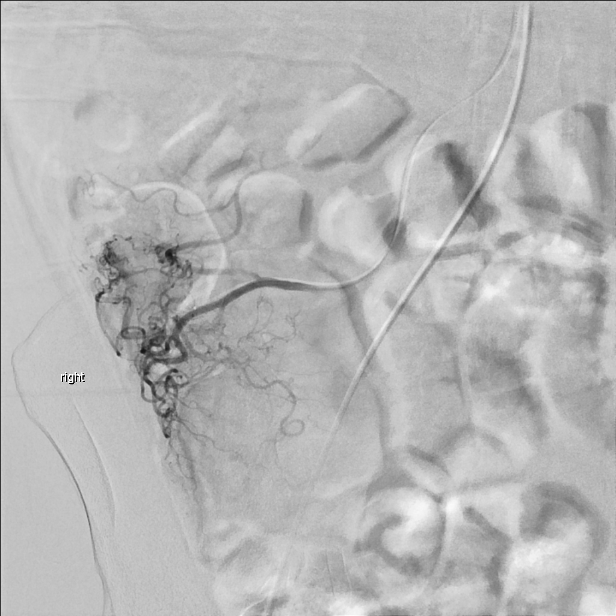
[im 11/17]
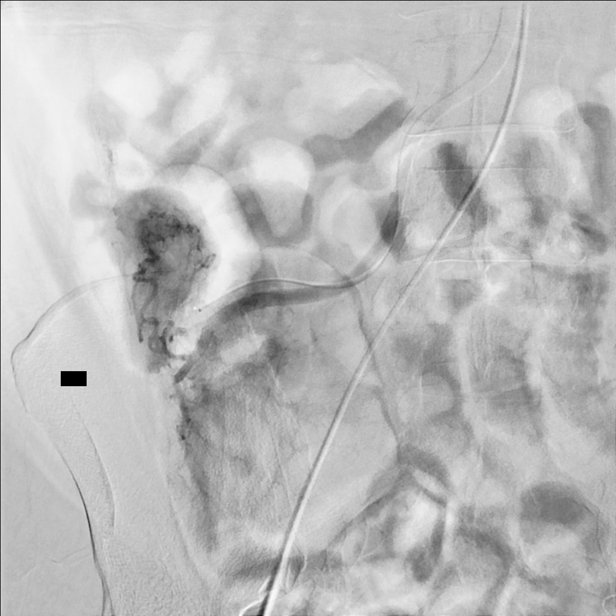

[Series 9: fl (-) angio · 1 of 97 frames shown (1 of 2)]
[frame 12/97]
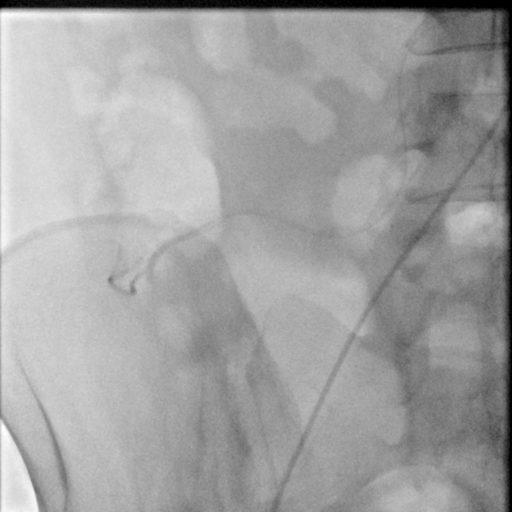

[Series 10: fl (-) angio · 1 of 1 slices shown (2 of 2)]
[im 1/1]
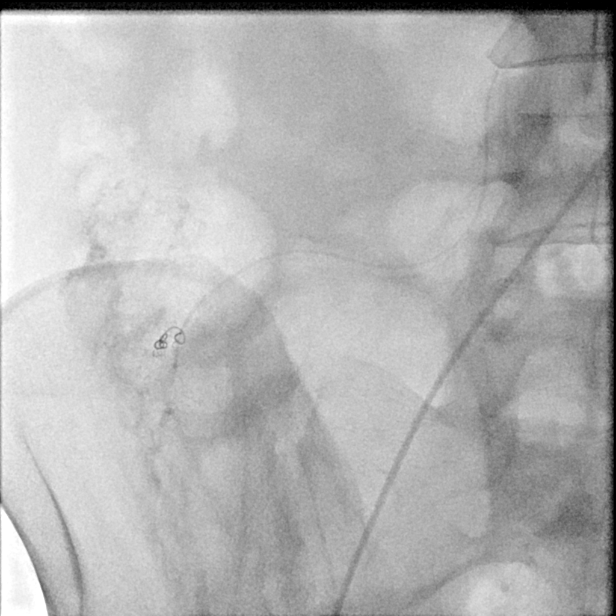

[Series 11: body 4 care · 15 acquisitions, 1 frame shown (3 of 6)]
[im 1/15]
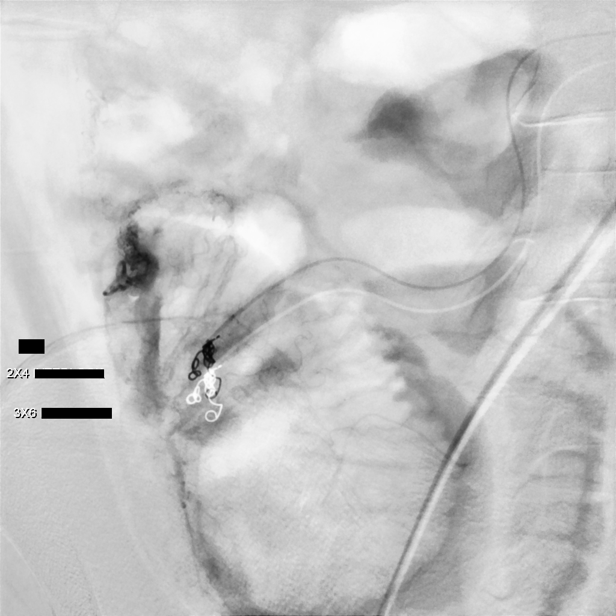

[Series 13: body 4 care · 17 acquisitions, 2 frames shown (4 of 6)]
[im 1/17]
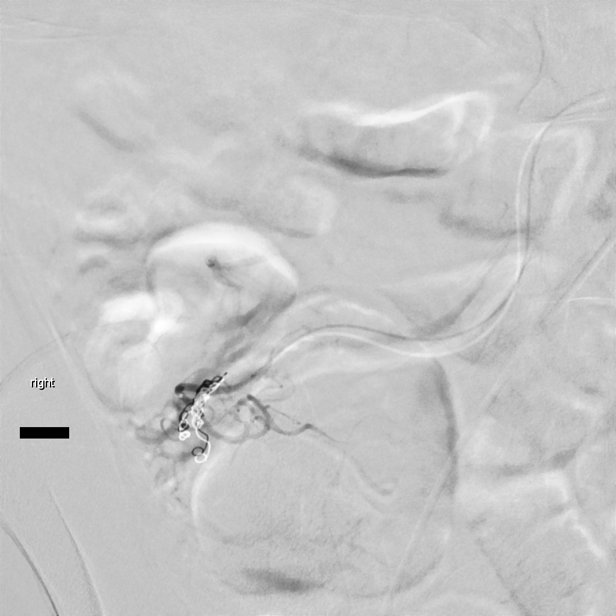
[im 9/17]
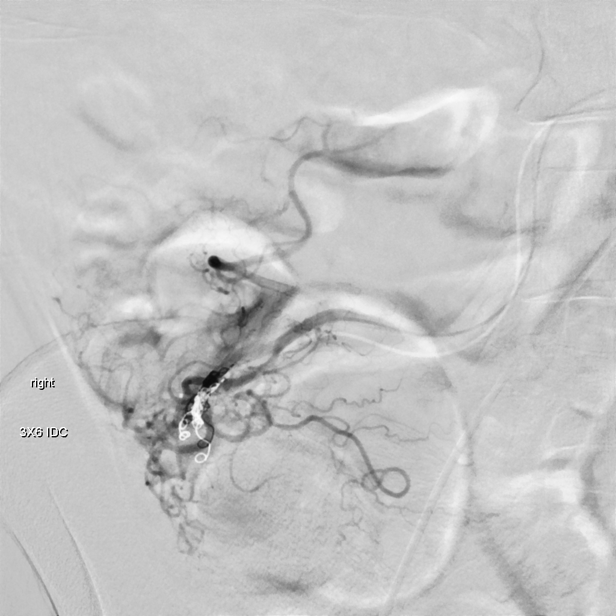

[Series 15: body 4 care · 16 acquisitions, 2 frames shown (5 of 6)]
[im 1/16]
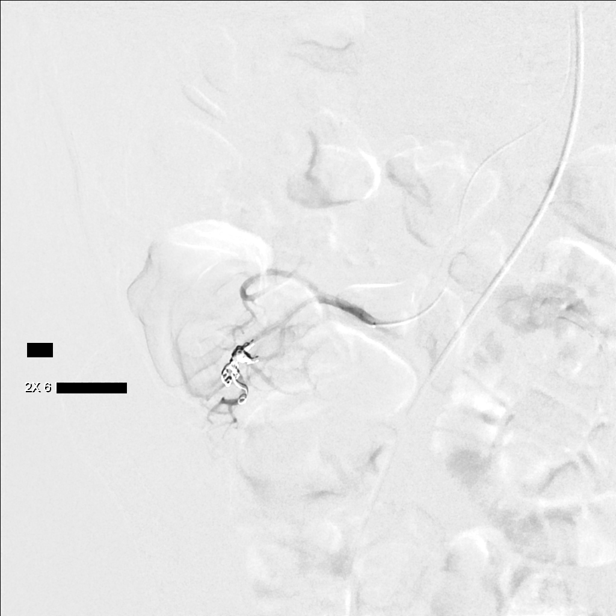
[im 1/16]
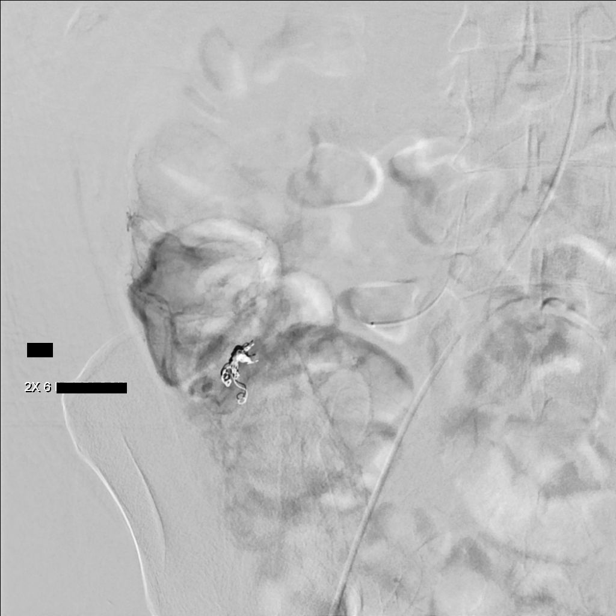

[Series 16: body 4 care · 9 acquisitions, 2 frames shown (6 of 6)]
[im 1/9]
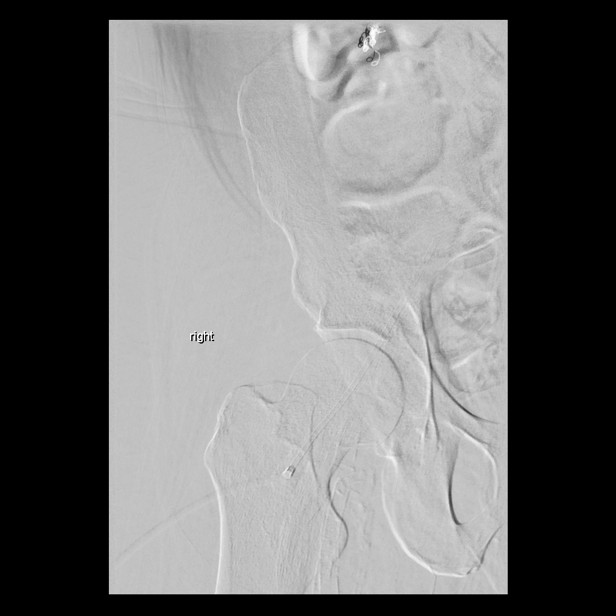
[im 9/9]
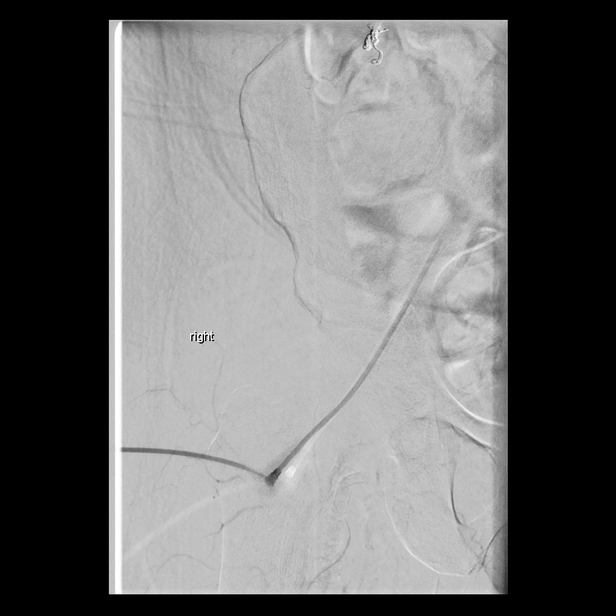

[12 of 24 positions shown; findings below may reference images not displayed]

EXAM:
Ultrasound guidance for vascular access

SMA, right colic, and 3 additional peripheral right colic branch
catheterizations and angiograms

Micro coil embolization of 2 peripheral right colic branches at the
site of right colonic angio dysplasia with intermittent bleeding.
(Site correlates with the nuclear medicine GI bleeding scan)

MEDICATIONS:
1% lidocaine local.

ANESTHESIA/SEDATION:
Moderate (conscious) sedation was employed during this procedure. A
total of Versed 2.0 mg and Fentanyl 100 mcg was administered
intravenously.

Moderate Sedation Time: 50 minutes. The patient's level of
consciousness and vital signs were monitored continuously by
radiology nursing throughout the procedure under my direct
supervision.

CONTRAST:  75 cc

FLUOROSCOPY TIME:  Fluoroscopy Time: 11 minutes 6 seconds (576 mGy).

COMPLICATIONS:
None immediate.



Under sterile conditions and local anesthesia, ultrasound
micropuncture access performed right common artery. Ultrasound
images obtained documentation of the patent right common femoral
artery. Five French sheath inserted over a guidewire. C2 catheter
advanced to the SMA over a Glidewire. SMA angiogram performed.

SMA: Atherosclerotic changes noted with wall calcification. SMA is
widely patent. The main SMA trunk including the jejunal and colic
branches are all patent. In the right lower quadrant, there are
hypertrophied tortuous peripheral right colic descending and
ascending branches with intermittent areas of mild bleeding/oozing
noted with a prominent enlarged draining vein in this region.
Appearance is compatible with right colonic angiodysplasia.

Through the C2 catheter, a Renegade STC catheter was advanced into
the right colic artery. This was performed over a GT Glidewire.
Right colic angiogram performed.

Right colic: The main right colic artery is patent. Peripheral right
colic ascending and descending branches are patent. Again, from 2 of
the ascending right colic branches there is tortuous
hypervascularity with intermittent oozing/bleeding noted and a
prominent early draining vein. This appears to be the dominant
vessels to the right colon angiodysplasia.

Renegade STC microcatheter was advanced into the right colic
descending branch.

Right colic descending branch: The descending branch appears patent
without significant supply to the area of angio dysplasia.
Descending branch vascular anatomy appears normal without
hypervascularity, tortuosity, bleeding or early venous drainage.

Microcatheter was retracted into the adjacent ascending right colic
branch. Peripheral angiogram performed.

Right colic ascending branch: There are 2 adjacent enlarged right
colic ascending branches with associated tortuous hypervascularity,
colonic wall staining, and early draining venous anatomy consistent
with angiodysplasia.

Right colic peripheral branch micro coil embolization: Two and 3 mm
interlock coils in length of 4 and 6 mm successfully deployed in 2
adjacent branches of a peripheral right colic ascending artery at
the site of angiodysplasia. Following micro coil embolization of
these 2 adjacent small branches, repeat injection demonstrates
significant reduction in arterial vascular flow throughout the
network of tortuous vessels related to angiodysplasia.

After approximately 5 minutes repeat injection of the peripheral
right colic artery again confirms reduction in arterial vascular
flow to the area hypervascularity. No further intermittent oozing or
bleeding appreciated.

At this point the procedure was stopped. Access removed. Hemostasis
obtained the right femoral puncture site with ExoSeal device
successfully. No immediate complication. Patient tolerated the
procedure well.
IMPRESSION: Angiographic findings compatible with right colon angiodysplasia
with intermittent bleeding. Successful micro coil embolization of 2
right colic peripheral branches at the site of dominant arterial
supply to the angiodysplasia.

## 2020-10-21 IMAGING — XA IR ANGIO/ADD [PERSON_NAME]
2 of 3 series · 12 of 24 positions shown · IV contrast (IODINE)
Comparison: none

INDICATION: End-stage renal disease, recurrent active lower GI bleeding, anemia
requiring transfusions

[Series 15: body 4 care · 16 acquisitions, 7 frames shown (1 of 2)]
[im 1/16]
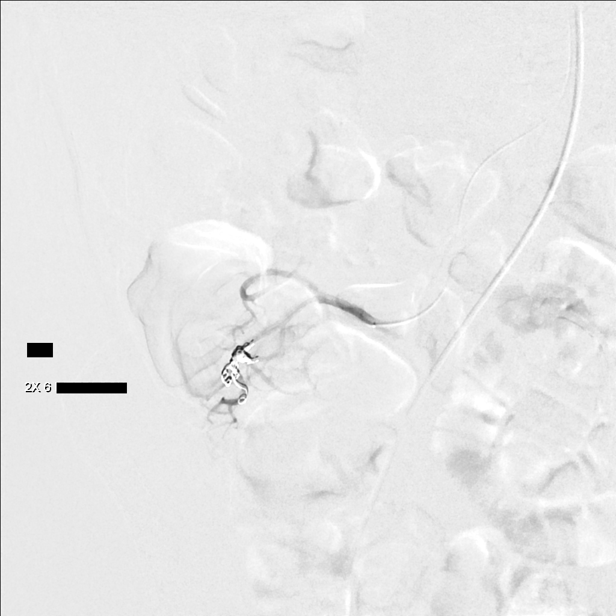
[im 1/16]
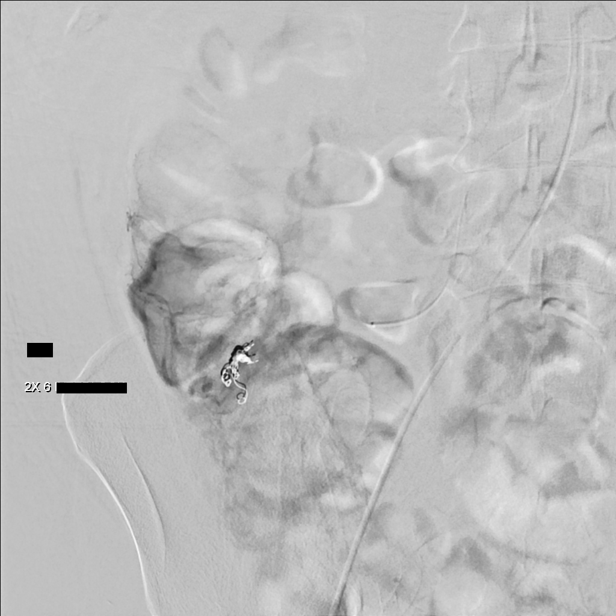
[im 3/16]
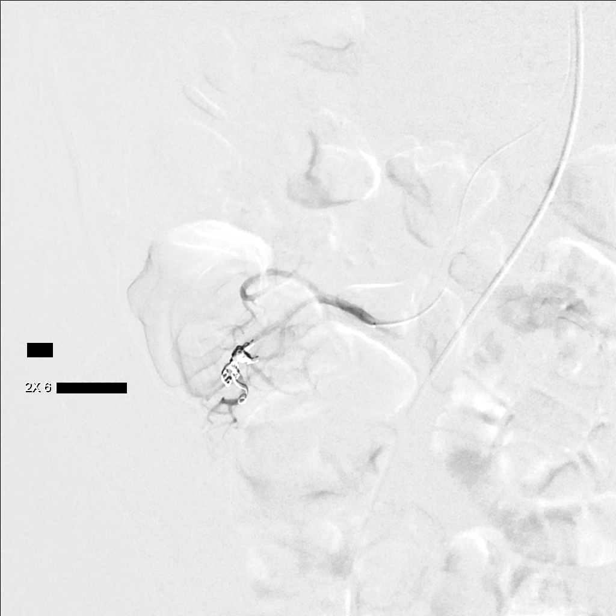
[im 6/16]
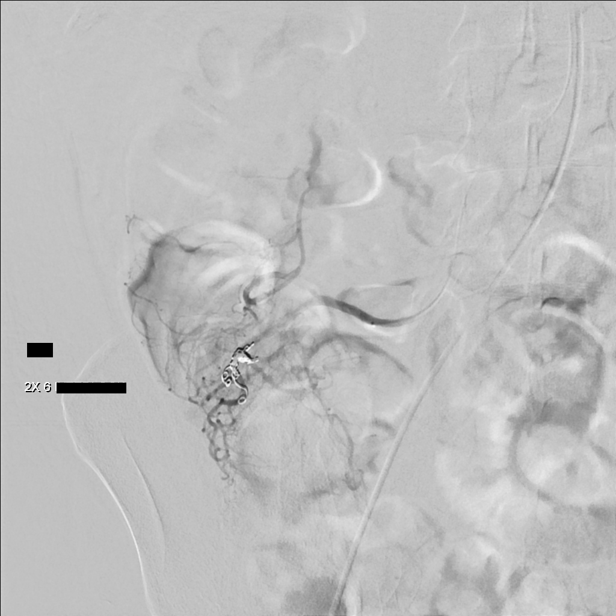
[im 8/16]
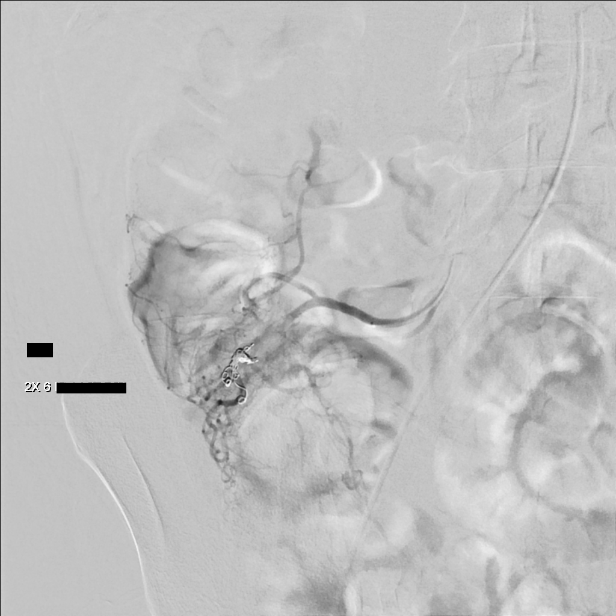
[im 11/16]
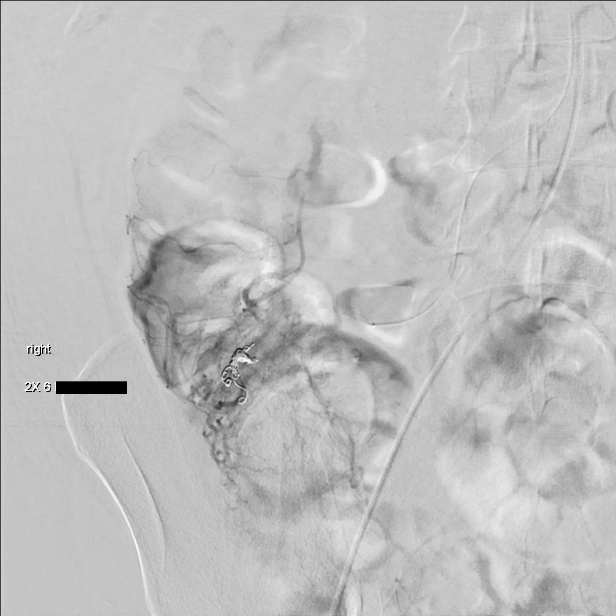
[im 13/16]
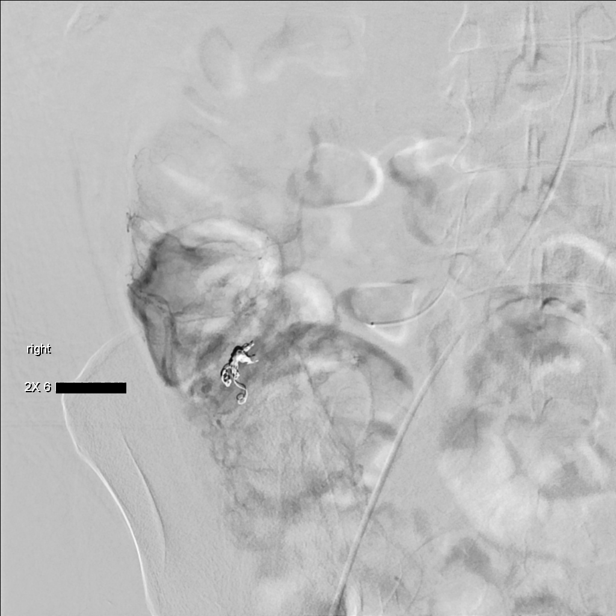

[Series 16: body 4 care · 9 acquisitions, 5 frames shown (2 of 2)]
[im 1/9]
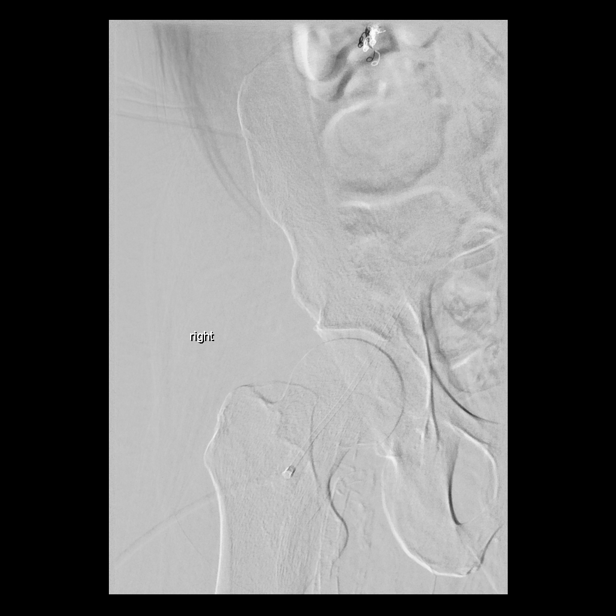
[im 1/9]
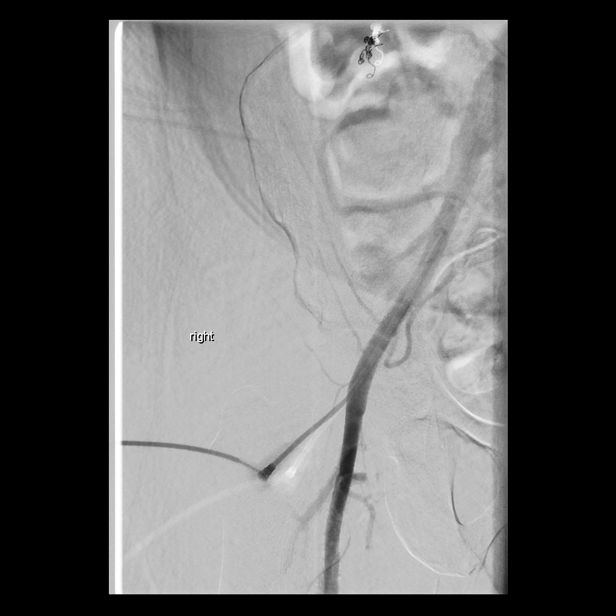
[im 3/9]
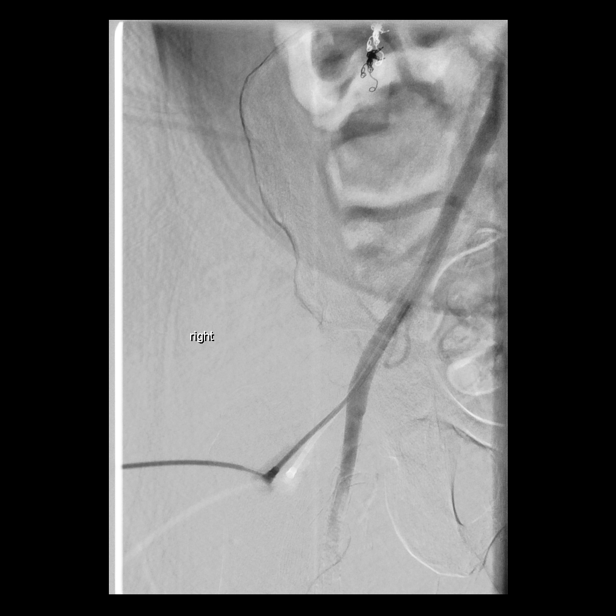
[im 5/9]
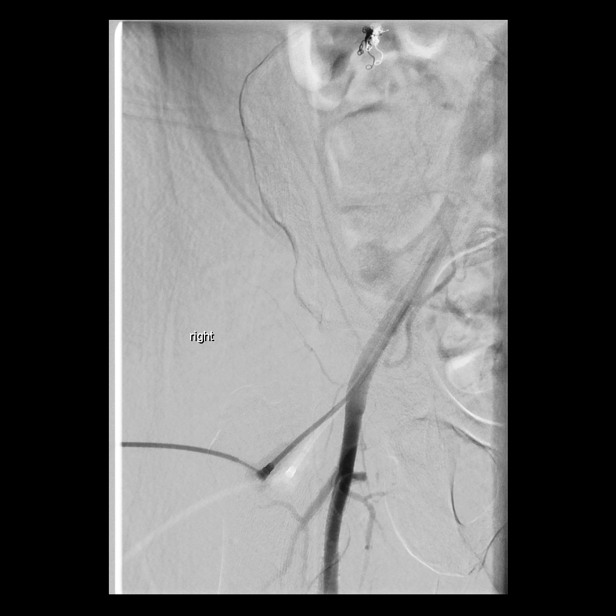
[im 9/9]
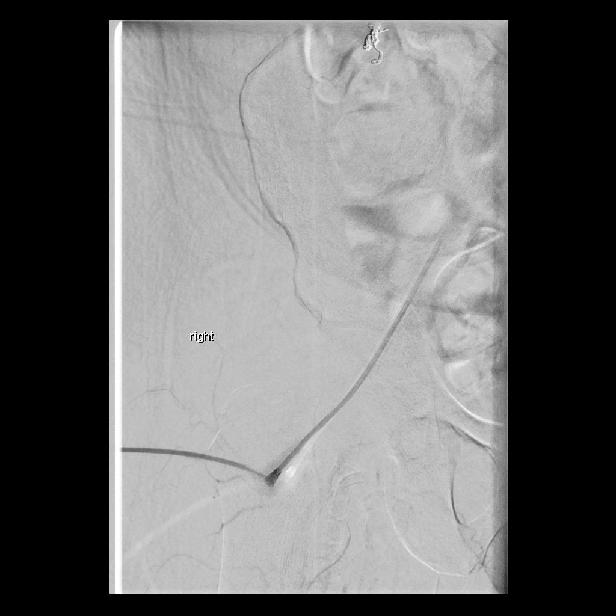

[12 of 24 positions shown; findings below may reference images not displayed]

EXAM:
Ultrasound guidance for vascular access

SMA, right colic, and 3 additional peripheral right colic branch
catheterizations and angiograms

Micro coil embolization of 2 peripheral right colic branches at the
site of right colonic angio dysplasia with intermittent bleeding.
(Site correlates with the nuclear medicine GI bleeding scan)

MEDICATIONS:
1% lidocaine local.

ANESTHESIA/SEDATION:
Moderate (conscious) sedation was employed during this procedure. A
total of Versed 2.0 mg and Fentanyl 100 mcg was administered
intravenously.

Moderate Sedation Time: 50 minutes. The patient's level of
consciousness and vital signs were monitored continuously by
radiology nursing throughout the procedure under my direct
supervision.

CONTRAST:  75 cc

FLUOROSCOPY TIME:  Fluoroscopy Time: 11 minutes 6 seconds (576 mGy).

COMPLICATIONS:
None immediate.



Under sterile conditions and local anesthesia, ultrasound
micropuncture access performed right common artery. Ultrasound
images obtained documentation of the patent right common femoral
artery. Five French sheath inserted over a guidewire. C2 catheter
advanced to the SMA over a Glidewire. SMA angiogram performed.

SMA: Atherosclerotic changes noted with wall calcification. SMA is
widely patent. The main SMA trunk including the jejunal and colic
branches are all patent. In the right lower quadrant, there are
hypertrophied tortuous peripheral right colic descending and
ascending branches with intermittent areas of mild bleeding/oozing
noted with a prominent enlarged draining vein in this region.
Appearance is compatible with right colonic angiodysplasia.

Through the C2 catheter, a Renegade STC catheter was advanced into
the right colic artery. This was performed over a GT Glidewire.
Right colic angiogram performed.

Right colic: The main right colic artery is patent. Peripheral right
colic ascending and descending branches are patent. Again, from 2 of
the ascending right colic branches there is tortuous
hypervascularity with intermittent oozing/bleeding noted and a
prominent early draining vein. This appears to be the dominant
vessels to the right colon angiodysplasia.

Renegade STC microcatheter was advanced into the right colic
descending branch.

Right colic descending branch: The descending branch appears patent
without significant supply to the area of angio dysplasia.
Descending branch vascular anatomy appears normal without
hypervascularity, tortuosity, bleeding or early venous drainage.

Microcatheter was retracted into the adjacent ascending right colic
branch. Peripheral angiogram performed.

Right colic ascending branch: There are 2 adjacent enlarged right
colic ascending branches with associated tortuous hypervascularity,
colonic wall staining, and early draining venous anatomy consistent
with angiodysplasia.

Right colic peripheral branch micro coil embolization: Two and 3 mm
interlock coils in length of 4 and 6 mm successfully deployed in 2
adjacent branches of a peripheral right colic ascending artery at
the site of angiodysplasia. Following micro coil embolization of
these 2 adjacent small branches, repeat injection demonstrates
significant reduction in arterial vascular flow throughout the
network of tortuous vessels related to angiodysplasia.

After approximately 5 minutes repeat injection of the peripheral
right colic artery again confirms reduction in arterial vascular
flow to the area hypervascularity. No further intermittent oozing or
bleeding appreciated.

At this point the procedure was stopped. Access removed. Hemostasis
obtained the right femoral puncture site with ExoSeal device
successfully. No immediate complication. Patient tolerated the
procedure well.
IMPRESSION: Angiographic findings compatible with right colon angiodysplasia
with intermittent bleeding. Successful micro coil embolization of 2
right colic peripheral branches at the site of dominant arterial
supply to the angiodysplasia.

## 2020-10-21 IMAGING — XA IR ANGIO/ADD [PERSON_NAME]
7 of 8 series · 12 of 24 positions shown · non-contrast
Comparison: none

INDICATION: End-stage renal disease, recurrent active lower GI bleeding, anemia
requiring transfusions

[Series 2: body 4 care · 9 acquisitions, 1 frame shown (1 of 5)]
[im 1/9]
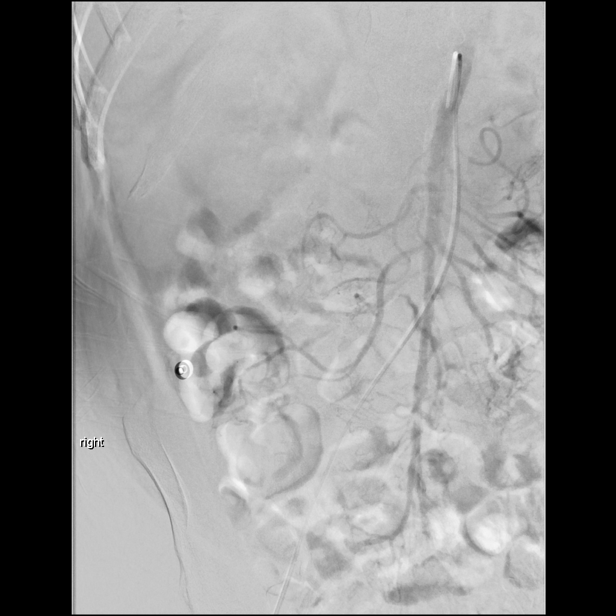

[Series 3: body 4 care · 16 acquisitions, 2 frames shown (2 of 5)]
[im 1/16]
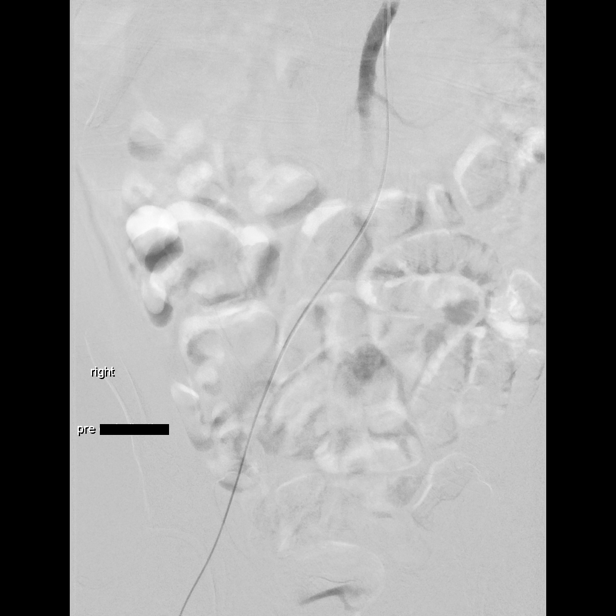
[im 6/16]
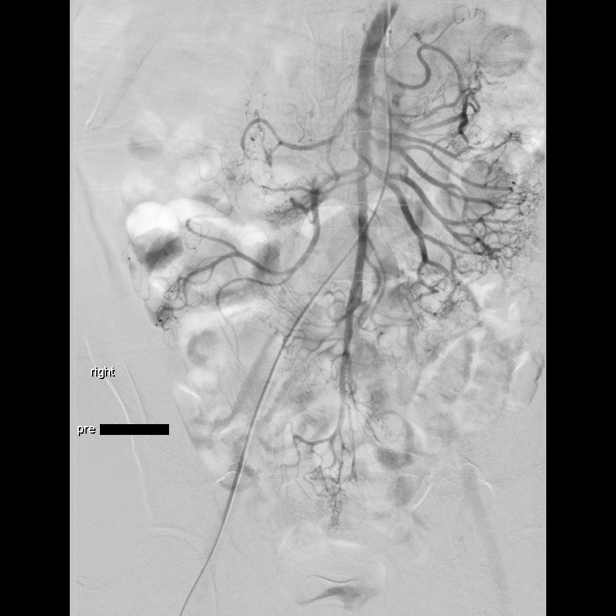

[Series 4: body 4 care · 14 acquisitions, 2 frames shown (3 of 5)]
[im 1/14]
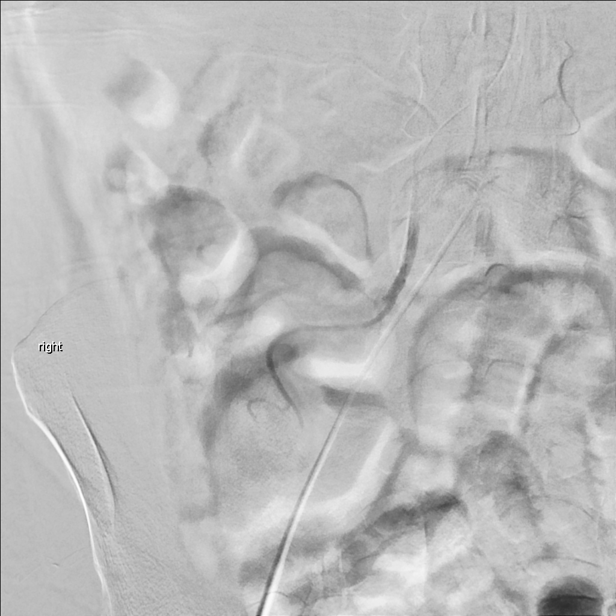
[im 4/14]
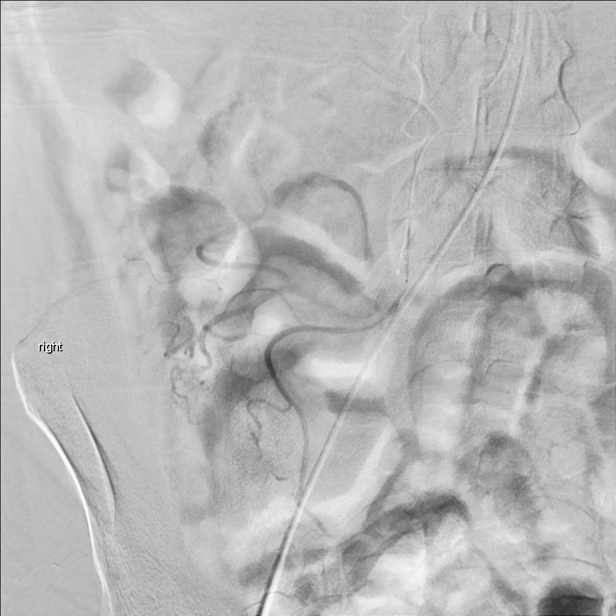

[Series 5: body 4 care · 11 acquisitions, 2 frames shown (4 of 5)]
[im 1/11]
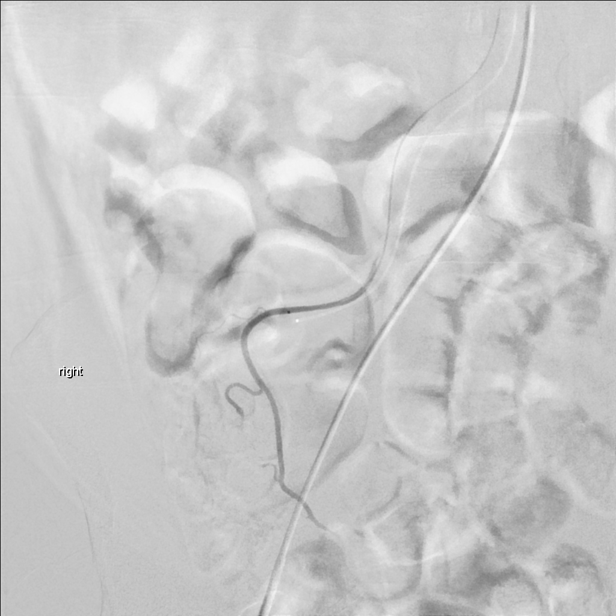
[im 1/11]
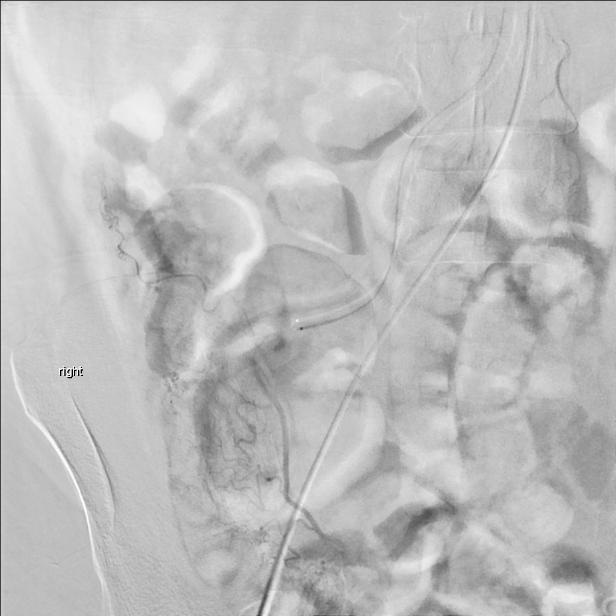

[Series 7: body 4 care · 17 acquisitions, 3 frames shown (5 of 5)]
[im 1/17]
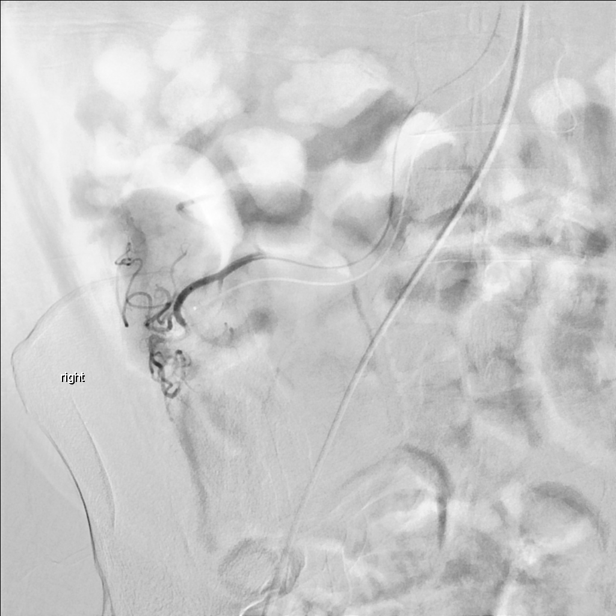
[im 1/17]
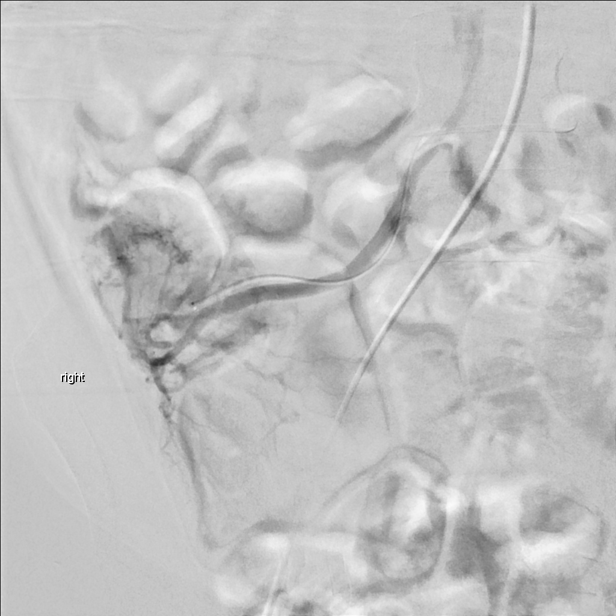
[im 13/17]
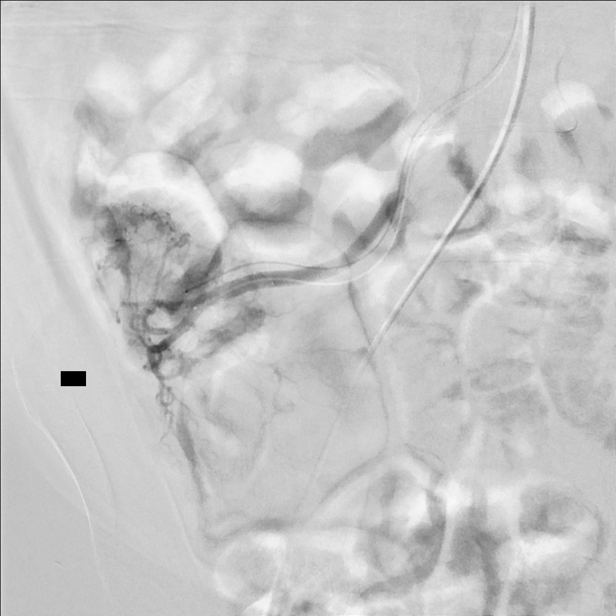

[Series 9: fl (-) angio · 1 of 97 frames shown (1 of 2)]
[frame 12/97]
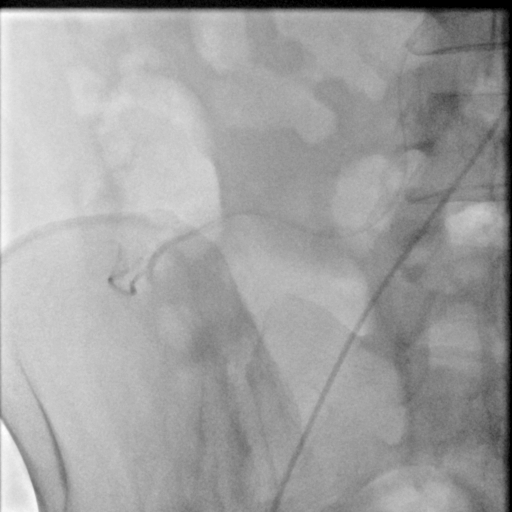

[Series 10: fl (-) angio · 1 of 1 slices shown (2 of 2)]
[im 1/1]
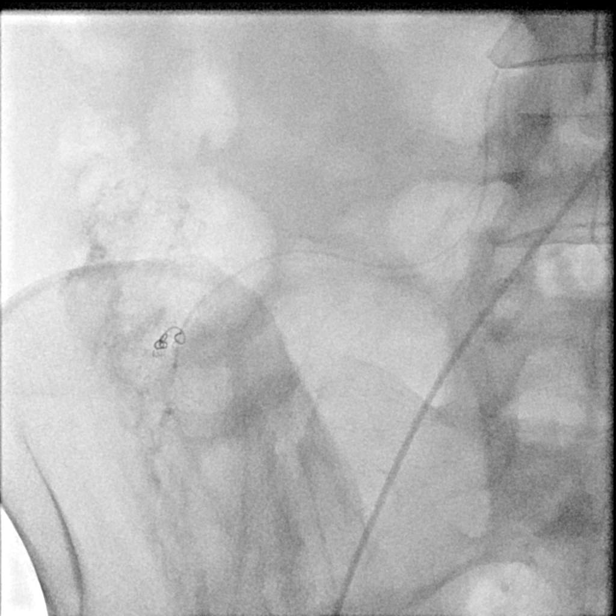

[12 of 24 positions shown; findings below may reference images not displayed]

EXAM:
Ultrasound guidance for vascular access

SMA, right colic, and 3 additional peripheral right colic branch
catheterizations and angiograms

Micro coil embolization of 2 peripheral right colic branches at the
site of right colonic angio dysplasia with intermittent bleeding.
(Site correlates with the nuclear medicine GI bleeding scan)

MEDICATIONS:
1% lidocaine local.

ANESTHESIA/SEDATION:
Moderate (conscious) sedation was employed during this procedure. A
total of Versed 2.0 mg and Fentanyl 100 mcg was administered
intravenously.

Moderate Sedation Time: 50 minutes. The patient's level of
consciousness and vital signs were monitored continuously by
radiology nursing throughout the procedure under my direct
supervision.

CONTRAST:  75 cc

FLUOROSCOPY TIME:  Fluoroscopy Time: 11 minutes 6 seconds (576 mGy).

COMPLICATIONS:
None immediate.



Under sterile conditions and local anesthesia, ultrasound
micropuncture access performed right common artery. Ultrasound
images obtained documentation of the patent right common femoral
artery. Five French sheath inserted over a guidewire. C2 catheter
advanced to the SMA over a Glidewire. SMA angiogram performed.

SMA: Atherosclerotic changes noted with wall calcification. SMA is
widely patent. The main SMA trunk including the jejunal and colic
branches are all patent. In the right lower quadrant, there are
hypertrophied tortuous peripheral right colic descending and
ascending branches with intermittent areas of mild bleeding/oozing
noted with a prominent enlarged draining vein in this region.
Appearance is compatible with right colonic angiodysplasia.

Through the C2 catheter, a Renegade STC catheter was advanced into
the right colic artery. This was performed over a GT Glidewire.
Right colic angiogram performed.

Right colic: The main right colic artery is patent. Peripheral right
colic ascending and descending branches are patent. Again, from 2 of
the ascending right colic branches there is tortuous
hypervascularity with intermittent oozing/bleeding noted and a
prominent early draining vein. This appears to be the dominant
vessels to the right colon angiodysplasia.

Renegade STC microcatheter was advanced into the right colic
descending branch.

Right colic descending branch: The descending branch appears patent
without significant supply to the area of angio dysplasia.
Descending branch vascular anatomy appears normal without
hypervascularity, tortuosity, bleeding or early venous drainage.

Microcatheter was retracted into the adjacent ascending right colic
branch. Peripheral angiogram performed.

Right colic ascending branch: There are 2 adjacent enlarged right
colic ascending branches with associated tortuous hypervascularity,
colonic wall staining, and early draining venous anatomy consistent
with angiodysplasia.

Right colic peripheral branch micro coil embolization: Two and 3 mm
interlock coils in length of 4 and 6 mm successfully deployed in 2
adjacent branches of a peripheral right colic ascending artery at
the site of angiodysplasia. Following micro coil embolization of
these 2 adjacent small branches, repeat injection demonstrates
significant reduction in arterial vascular flow throughout the
network of tortuous vessels related to angiodysplasia.

After approximately 5 minutes repeat injection of the peripheral
right colic artery again confirms reduction in arterial vascular
flow to the area hypervascularity. No further intermittent oozing or
bleeding appreciated.

At this point the procedure was stopped. Access removed. Hemostasis
obtained the right femoral puncture site with ExoSeal device
successfully. No immediate complication. Patient tolerated the
procedure well.
IMPRESSION: Angiographic findings compatible with right colon angiodysplasia
with intermittent bleeding. Successful micro coil embolization of 2
right colic peripheral branches at the site of dominant arterial
supply to the angiodysplasia.

## 2020-12-28 IMAGING — MR MR HEAD W/O CM
12 of 13 series · 44 of 48 positions shown · non-contrast
Comparison: Brain MRI 06/04/2017

CLINICAL DATA: Neuro deficit, acute, stroke suspected. Additional
history provided: 53-year-old female with history of ESRD on
hemodialysis with recent GI bleed from angio dysplasia.

EXAM:
MRI HEAD WITHOUT CONTRAST
TECHNIQUE: Multiplanar, multiecho pulse sequences of the brain and surrounding
structures were obtained without intravenous contrast.

[Series 5: DWI · axial · 3.0mm · 0.92mm/px · z∈[-143,+1]mm · 8 of 106 slices shown (1 of 4)]
[im 1/106]
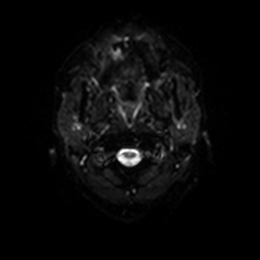
[im 16/106]
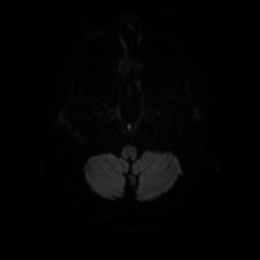
[im 31/106]
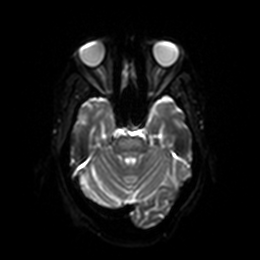
[im 46/106]
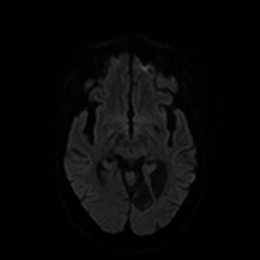
[im 61/106]
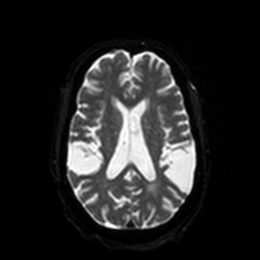
[im 76/106]
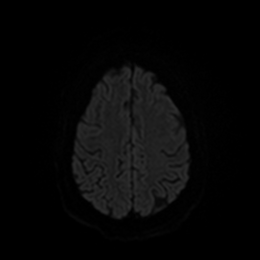
[im 91/106]
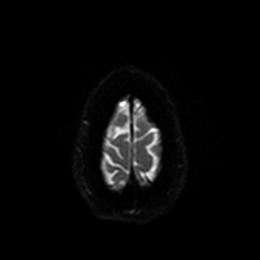
[im 106/106]
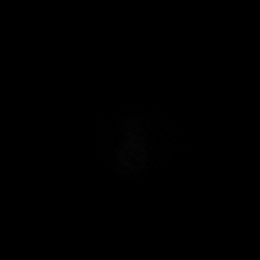

[Series 6: DWI · axial · 3.0mm · 0.92mm/px · z∈[-143,+1]mm · 4 of 53 slices shown (2 of 4)]
[im 1/53]
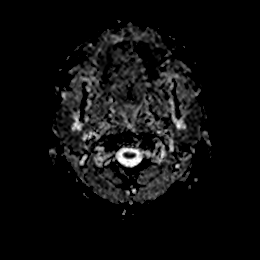
[im 18/53]
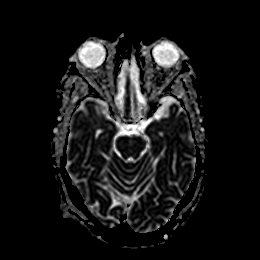
[im 35/53]
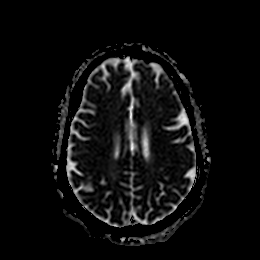
[im 53/53]
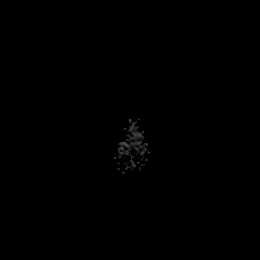

[Series 7: DWI · coronal · 4.0mm · 0.88mm/px · 6 of 78 slices shown (3 of 4)]
[im 1/78]
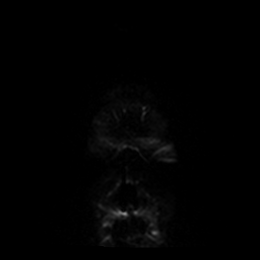
[im 16/78]
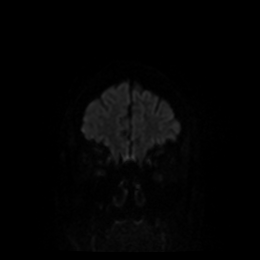
[im 31/78]
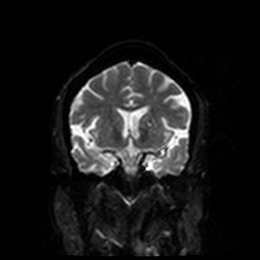
[im 47/78]
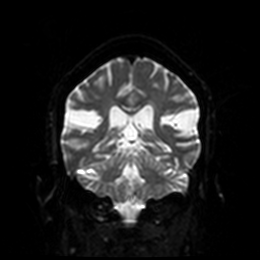
[im 62/78]
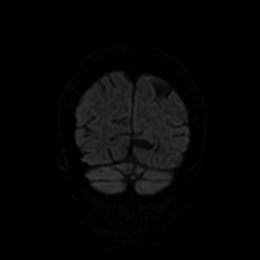
[im 78/78]
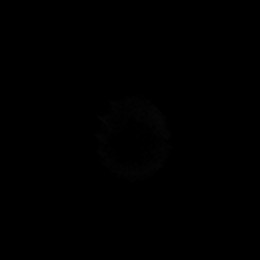

[Series 8: DWI · coronal · 4.0mm · 0.88mm/px · 3 of 39 slices shown (4 of 4)]
[im 1/39]
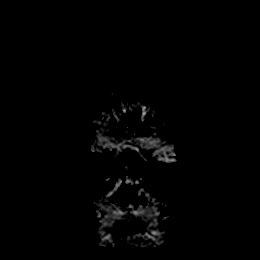
[im 20/39]
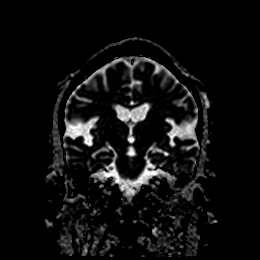
[im 39/39]
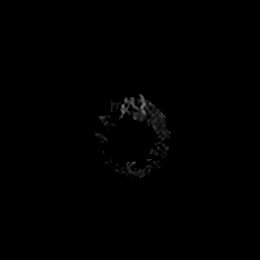

[Series 9: FLAIR · axial · 5.0mm · 0.45mm/px · z∈[-127,+7]mm · 2 of 25 slices shown]
[im 1/25]
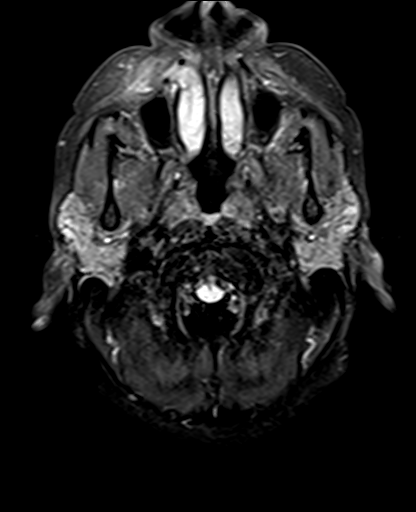
[im 25/25]
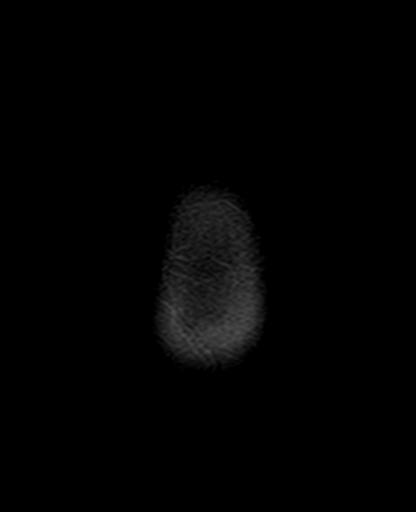

[Series 10: mag_images · axial · 3.0mm · 0.90mm/px · z∈[-131,+11]mm · 4 of 52 slices shown]
[im 1/52]
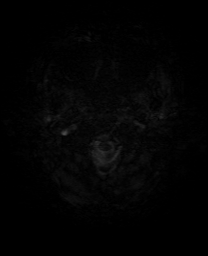
[im 18/52]
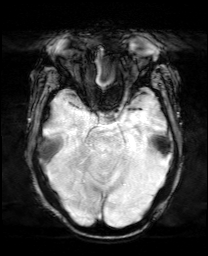
[im 35/52]
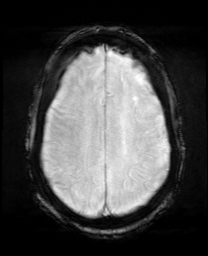
[im 52/52]
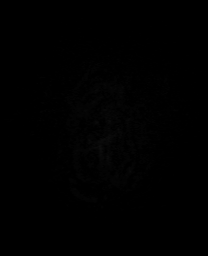

[Series 11: pha_images · axial · 3.0mm · 0.90mm/px · z∈[-131,+8]mm · 4 of 51 slices shown]
[im 1/51]
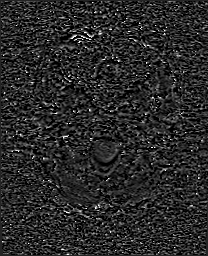
[im 17/51]
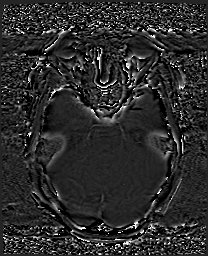
[im 34/51]
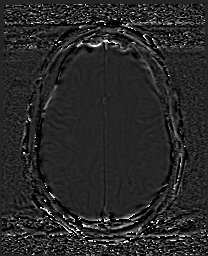
[im 51/51]
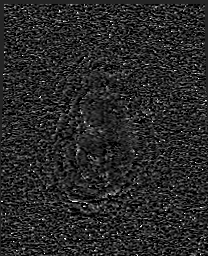

[Series 12: swi_images · axial · 3.0mm · 0.90mm/px · z∈[-131,+11]mm · 4 of 52 slices shown]
[im 1/52]
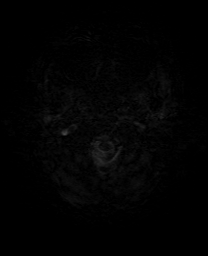
[im 18/52]
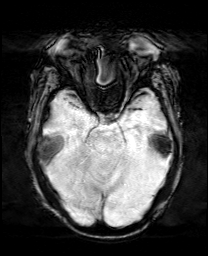
[im 35/52]
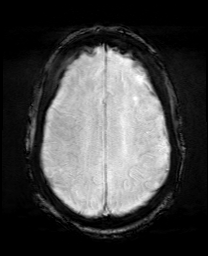
[im 52/52]
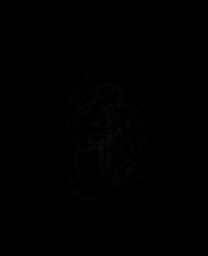

[Series 13: mip_images(sw) · axial · 24.0mm · 0.90mm/px · z∈[-121,+1]mm · 3 of 45 slices shown]
[im 1/45]
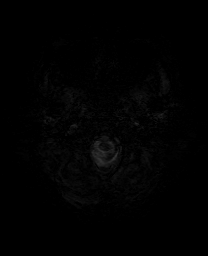
[im 23/45]
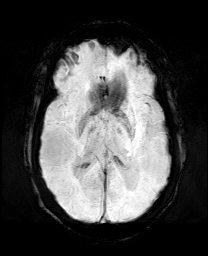
[im 45/45]
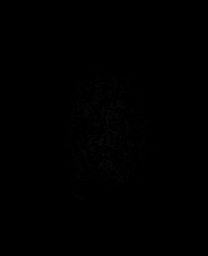

[Series 14: T1 · sagittal · 5.0mm · 0.75mm/px · 2 of 25 slices shown]
[im 1/25]
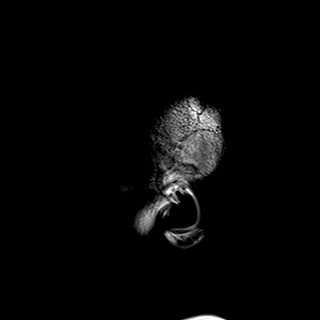
[im 25/25]
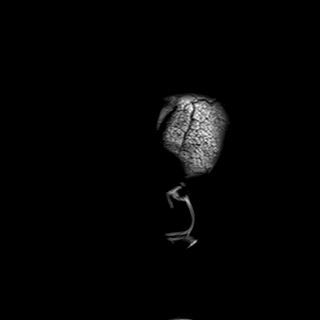

[Series 15: T2 · axial · 5.0mm · 0.72mm/px · z∈[-128,+5]mm · 2 of 25 slices shown (1 of 2)]
[im 1/25]
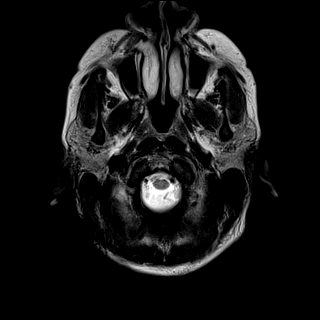
[im 25/25]
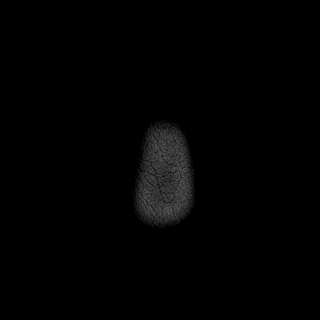

[Series 17: T2 · coronal · 5.0mm · 0.34mm/px · 2 of 32 slices shown (2 of 2)]
[im 1/32]
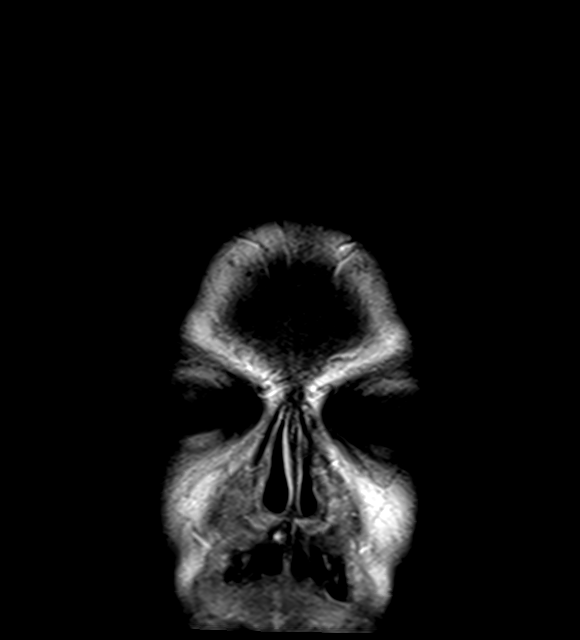
[im 32/32]
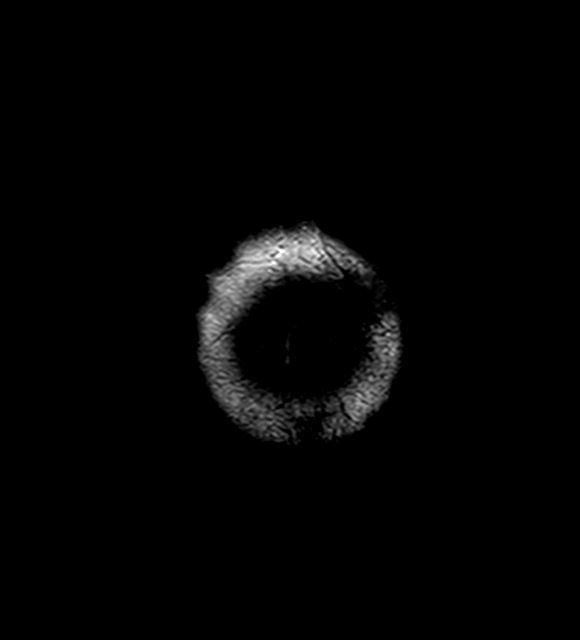

[44 of 48 positions shown; findings below may reference images not displayed]

FINDINGS: Brain:

There is no evidence of acute infarct.

No evidence of intracranial mass.

No midline shift or extra-axial fluid collection.

No chronic intracranial blood products.

Patchy and confluent T2/FLAIR hyperintensity within the cerebral
white matter and pons appears somewhat progressed from prior
examination 06/04/2017. Findings are nonspecific, but consistent
with chronic small vessel ischemic disease. Redemonstrated prominent
perivascular space within the left lentiform nucleus.

Stable cerebral volume including prominent perisylvian volume loss.

Vascular: Flow voids maintained within the proximal large arterial
vessels.

Skull and upper cervical spine: Redemonstrated diffuse T1
hypointense marrow signal throughout the visualized cervical spine.
No focal suspicious osseous lesion.

Sinuses/Orbits: Visualized orbits demonstrate no acute abnormality.
No significant paranasal sinus disease or mastoid effusion at the
imaged levels.
IMPRESSION: No evidence of acute intracranial abnormality, including acute
infarction.

Chronic small vessel ischemic disease within the cerebral white
matter and pons has somewhat progressed since prior MRI 06/04/2017.

Stable cerebral volume loss including prominent bilateral
perisylvian volume loss.

Redemonstrated diffuse abnormal T1 hypointense marrow signal which
may reflect sequela of renal osteodystrophy given provided history.

## 2020-12-28 IMAGING — CR DG ABDOMEN 1V
1 series · 1 of 1 positions shown · non-contrast
Comparison: May 13, 2019

CLINICAL DATA: Constipation, nausea and vomiting for 4 days.

EXAM:
ABDOMEN - 1 VIEW

[abdomen kub]
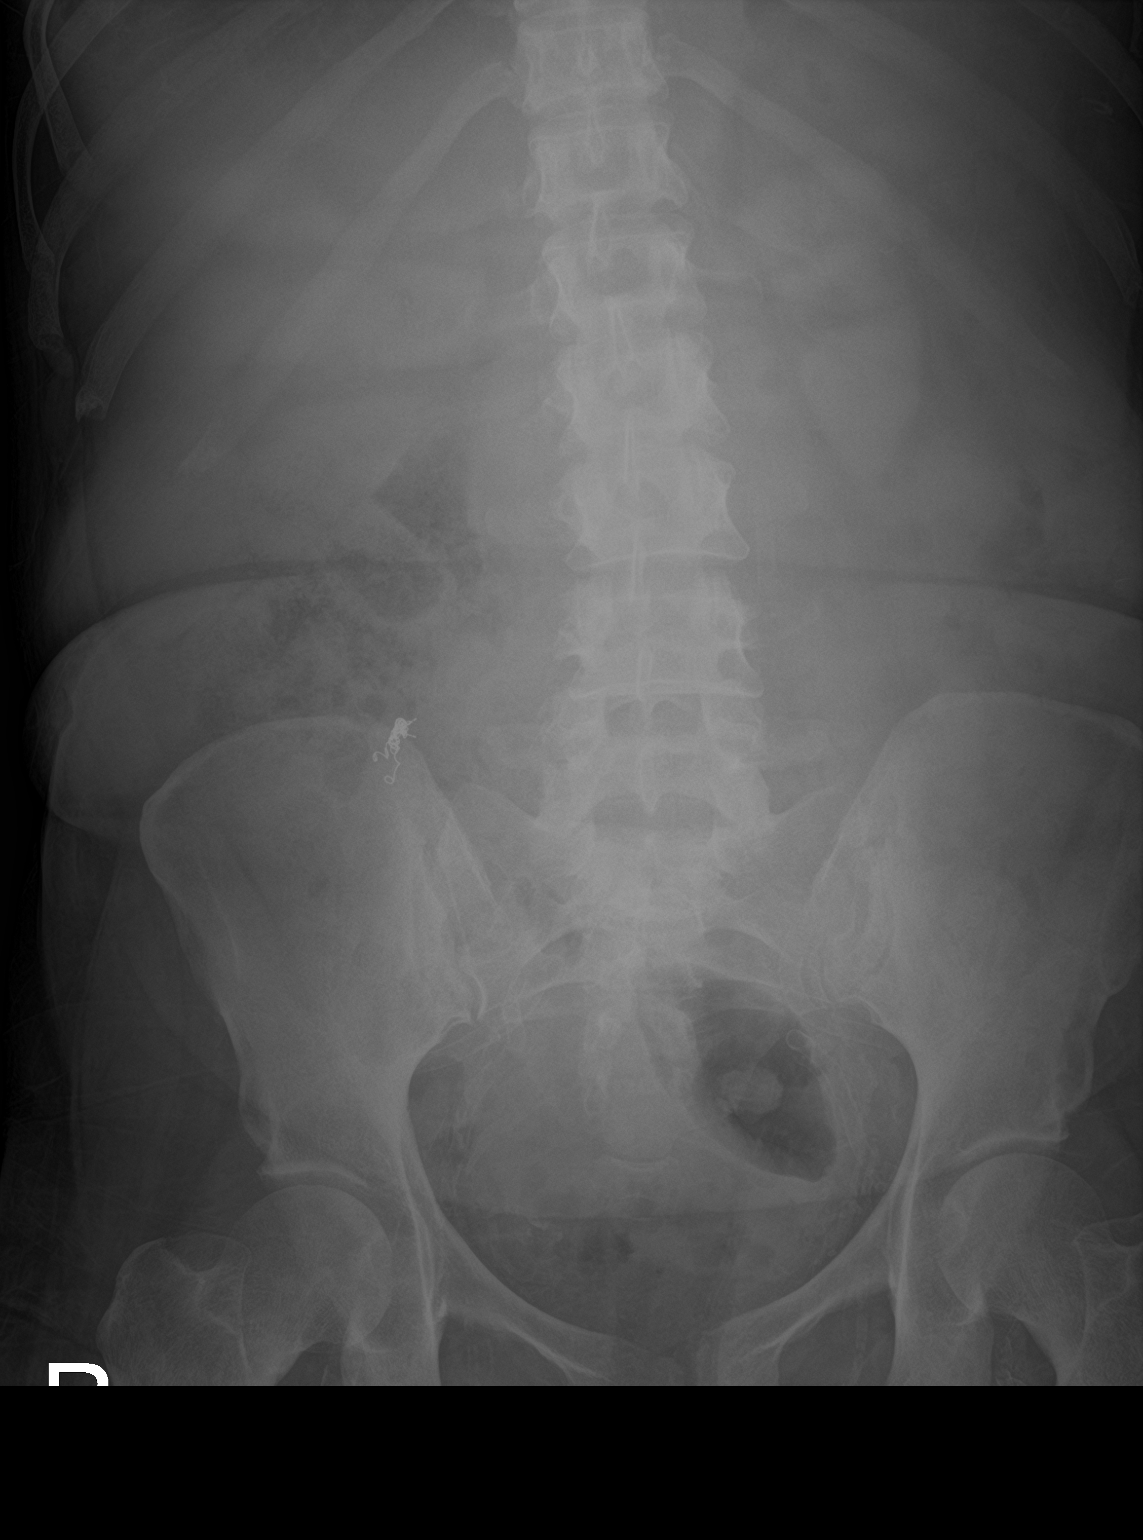

[1 of 1 positions shown; findings below may reference images not displayed]

FINDINGS: There is no bowel obstruction. Relative paucity of bowel gas is
noted. Moderate bowel content is identified in the visualized colon.
IMPRESSION: No bowel obstruction. Moderate bowel content identified in the
visualized colon.
